# Patient Record
Sex: Female | Born: 1955 | Race: Black or African American | Hispanic: No | Marital: Single
Health system: Midwestern US, Academic
[De-identification: ages and names within clinical notes are randomized; demographics above are authoritative.]

## PROBLEM LIST (undated history)

## (undated) DIAGNOSIS — N289 Disorder of kidney and ureter, unspecified: Secondary | ICD-10-CM

## (undated) DIAGNOSIS — K219 Gastro-esophageal reflux disease without esophagitis: Secondary | ICD-10-CM

## (undated) DIAGNOSIS — M5136 Other intervertebral disc degeneration, lumbar region: Secondary | ICD-10-CM

## (undated) DIAGNOSIS — I10 Essential (primary) hypertension: Secondary | ICD-10-CM

## (undated) DIAGNOSIS — F32A Depression, unspecified: Secondary | ICD-10-CM

## (undated) DIAGNOSIS — K7460 Unspecified cirrhosis of liver: Secondary | ICD-10-CM

## (undated) DIAGNOSIS — I209 Angina pectoris, unspecified: Secondary | ICD-10-CM

## (undated) DIAGNOSIS — M5126 Other intervertebral disc displacement, lumbar region: Secondary | ICD-10-CM

## (undated) DIAGNOSIS — A4902 Methicillin resistant Staphylococcus aureus infection, unspecified site: Secondary | ICD-10-CM

## (undated) DIAGNOSIS — M797 Fibromyalgia: Secondary | ICD-10-CM

## (undated) DIAGNOSIS — R0602 Shortness of breath: Secondary | ICD-10-CM

## (undated) DIAGNOSIS — K5900 Constipation, unspecified: Secondary | ICD-10-CM

## (undated) DIAGNOSIS — H53122 Transient visual loss, left eye: Secondary | ICD-10-CM

## (undated) DIAGNOSIS — T8131XA Disruption of external operation (surgical) wound, not elsewhere classified, initial encounter: Secondary | ICD-10-CM

## (undated) DIAGNOSIS — J45909 Unspecified asthma, uncomplicated: Secondary | ICD-10-CM

## (undated) DIAGNOSIS — G629 Polyneuropathy, unspecified: Secondary | ICD-10-CM

## (undated) DIAGNOSIS — F4310 Post-traumatic stress disorder, unspecified: Secondary | ICD-10-CM

## (undated) DIAGNOSIS — N189 Chronic kidney disease, unspecified: Secondary | ICD-10-CM

## (undated) DIAGNOSIS — E7800 Pure hypercholesterolemia, unspecified: Secondary | ICD-10-CM

## (undated) DIAGNOSIS — I219 Acute myocardial infarction, unspecified: Secondary | ICD-10-CM

## (undated) DIAGNOSIS — M51369 Other intervertebral disc degeneration, lumbar region without mention of lumbar back pain or lower extremity pain: Secondary | ICD-10-CM

## (undated) DIAGNOSIS — K760 Fatty (change of) liver, not elsewhere classified: Secondary | ICD-10-CM

## (undated) DIAGNOSIS — S42209A Unspecified fracture of upper end of unspecified humerus, initial encounter for closed fracture: Secondary | ICD-10-CM

## (undated) DIAGNOSIS — M1990 Unspecified osteoarthritis, unspecified site: Secondary | ICD-10-CM

## (undated) DIAGNOSIS — F329 Major depressive disorder, single episode, unspecified: Secondary | ICD-10-CM

## (undated) DIAGNOSIS — F419 Anxiety disorder, unspecified: Secondary | ICD-10-CM

## (undated) DIAGNOSIS — H269 Unspecified cataract: Secondary | ICD-10-CM

## (undated) DIAGNOSIS — D649 Anemia, unspecified: Secondary | ICD-10-CM

## (undated) HISTORY — DX: Transient visual loss, left eye: H53.122

## (undated) HISTORY — DX: Methicillin resistant Staphylococcus aureus infection, unspecified site: A49.02

## (undated) HISTORY — DX: Unspecified cataract: H26.9

## (undated) HISTORY — DX: Fatty (change of) liver, not elsewhere classified: K76.0

## (undated) HISTORY — DX: Disorder of kidney and ureter, unspecified: N28.9

## (undated) HISTORY — DX: Unspecified cirrhosis of liver: K74.60

---
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
## 1975-10-31 HISTORY — PX: DILATION AND CURETTAGE OF UTERUS: SHX78

## 1977-10-30 HISTORY — PX: ABDOMINAL HYSTERECTOMY: SHX81

## 1982-10-30 HISTORY — PX: INCISION AND DRAINAGE OF WOUND: SHX1803

## 1992-10-30 HISTORY — PX: LEFT OOPHORECTOMY: SHX1961

## 1993-10-30 HISTORY — PX: CHOLECYSTECTOMY: SHX55

## 2000-04-28 ENCOUNTER — Ambulatory Visit (HOSPITAL_COMMUNITY): Admission: AD | Admit: 2000-04-28 | Discharge: 2000-04-28 | Payer: Self-pay | Admitting: Obstetrics and Gynecology

## 2000-06-17 ENCOUNTER — Encounter: Payer: Self-pay | Admitting: Emergency Medicine

## 2000-06-17 ENCOUNTER — Emergency Department (HOSPITAL_COMMUNITY): Admission: EM | Admit: 2000-06-17 | Discharge: 2000-06-17 | Payer: Self-pay | Admitting: Emergency Medicine

## 2001-10-25 ENCOUNTER — Emergency Department (HOSPITAL_COMMUNITY): Admission: EM | Admit: 2001-10-25 | Discharge: 2001-10-25 | Payer: Self-pay | Admitting: Emergency Medicine

## 2001-10-26 ENCOUNTER — Emergency Department (HOSPITAL_COMMUNITY): Admission: EM | Admit: 2001-10-26 | Discharge: 2001-10-26 | Payer: Self-pay | Admitting: Emergency Medicine

## 2002-03-15 ENCOUNTER — Emergency Department (HOSPITAL_COMMUNITY): Admission: EM | Admit: 2002-03-15 | Discharge: 2002-03-15 | Payer: Self-pay

## 2002-09-19 ENCOUNTER — Encounter: Payer: Self-pay | Admitting: Family Medicine

## 2002-09-19 ENCOUNTER — Ambulatory Visit (HOSPITAL_COMMUNITY): Admission: RE | Admit: 2002-09-19 | Discharge: 2002-09-19 | Payer: Self-pay | Admitting: Family Medicine

## 2005-05-30 ENCOUNTER — Ambulatory Visit (HOSPITAL_COMMUNITY): Admission: RE | Admit: 2005-05-30 | Discharge: 2005-05-30 | Payer: Self-pay | Admitting: Family Medicine

## 2005-06-23 ENCOUNTER — Encounter (HOSPITAL_BASED_OUTPATIENT_CLINIC_OR_DEPARTMENT_OTHER): Admission: RE | Admit: 2005-06-23 | Discharge: 2005-07-26 | Payer: Self-pay | Admitting: Surgery

## 2005-10-30 DIAGNOSIS — I219 Acute myocardial infarction, unspecified: Secondary | ICD-10-CM

## 2005-10-30 HISTORY — DX: Acute myocardial infarction, unspecified: I21.9

## 2005-10-30 HISTORY — PX: CARDIAC CATHETERIZATION: SHX172

## 2006-03-30 ENCOUNTER — Inpatient Hospital Stay (HOSPITAL_COMMUNITY): Admission: AD | Admit: 2006-03-30 | Discharge: 2006-04-03 | Payer: Self-pay | Admitting: Internal Medicine

## 2006-03-30 ENCOUNTER — Ambulatory Visit: Payer: Self-pay | Admitting: *Deleted

## 2006-03-30 ENCOUNTER — Ambulatory Visit: Payer: Self-pay | Admitting: Internal Medicine

## 2006-03-30 DIAGNOSIS — Z9889 Other specified postprocedural states: Secondary | ICD-10-CM | POA: Insufficient documentation

## 2006-03-31 ENCOUNTER — Ambulatory Visit: Payer: Self-pay | Admitting: Internal Medicine

## 2006-04-02 ENCOUNTER — Encounter (INDEPENDENT_AMBULATORY_CARE_PROVIDER_SITE_OTHER): Payer: Self-pay | Admitting: *Deleted

## 2006-04-03 ENCOUNTER — Ambulatory Visit: Payer: Self-pay | Admitting: Internal Medicine

## 2006-04-09 ENCOUNTER — Ambulatory Visit: Payer: Self-pay | Admitting: Internal Medicine

## 2006-09-18 ENCOUNTER — Ambulatory Visit: Payer: Self-pay | Admitting: Internal Medicine

## 2006-09-28 ENCOUNTER — Ambulatory Visit: Payer: Self-pay | Admitting: Internal Medicine

## 2006-11-30 LAB — CONVERTED CEMR LAB: Pap Smear: NORMAL

## 2006-12-23 DIAGNOSIS — I10 Essential (primary) hypertension: Secondary | ICD-10-CM

## 2006-12-23 DIAGNOSIS — R079 Chest pain, unspecified: Secondary | ICD-10-CM | POA: Insufficient documentation

## 2006-12-23 DIAGNOSIS — J309 Allergic rhinitis, unspecified: Secondary | ICD-10-CM | POA: Insufficient documentation

## 2006-12-23 HISTORY — DX: Essential (primary) hypertension: I10

## 2007-03-29 ENCOUNTER — Telehealth (INDEPENDENT_AMBULATORY_CARE_PROVIDER_SITE_OTHER): Payer: Self-pay | Admitting: *Deleted

## 2007-04-05 ENCOUNTER — Telehealth (INDEPENDENT_AMBULATORY_CARE_PROVIDER_SITE_OTHER): Payer: Self-pay | Admitting: *Deleted

## 2007-04-25 ENCOUNTER — Encounter (INDEPENDENT_AMBULATORY_CARE_PROVIDER_SITE_OTHER): Payer: Self-pay | Admitting: *Deleted

## 2007-05-24 ENCOUNTER — Telehealth (INDEPENDENT_AMBULATORY_CARE_PROVIDER_SITE_OTHER): Payer: Self-pay | Admitting: *Deleted

## 2007-06-03 ENCOUNTER — Telehealth (INDEPENDENT_AMBULATORY_CARE_PROVIDER_SITE_OTHER): Payer: Self-pay | Admitting: *Deleted

## 2007-06-04 ENCOUNTER — Encounter: Payer: Self-pay | Admitting: Internal Medicine

## 2007-06-14 ENCOUNTER — Telehealth (INDEPENDENT_AMBULATORY_CARE_PROVIDER_SITE_OTHER): Payer: Self-pay | Admitting: *Deleted

## 2007-07-03 ENCOUNTER — Ambulatory Visit: Payer: Self-pay | Admitting: Internal Medicine

## 2007-07-05 ENCOUNTER — Telehealth (INDEPENDENT_AMBULATORY_CARE_PROVIDER_SITE_OTHER): Payer: Self-pay | Admitting: *Deleted

## 2007-07-05 LAB — CONVERTED CEMR LAB
ALT: 41 units/L — ABNORMAL HIGH (ref 0–35)
AST: 37 units/L (ref 0–37)
Albumin: 3.8 g/dL (ref 3.5–5.2)
Alkaline Phosphatase: 90 units/L (ref 39–117)
BUN: 4 mg/dL — ABNORMAL LOW (ref 6–23)
Basophils Absolute: 0 10*3/uL (ref 0.0–0.1)
Basophils Relative: 0 % (ref 0.0–1.0)
Bilirubin, Direct: 0.1 mg/dL (ref 0.0–0.3)
CO2: 32 meq/L (ref 19–32)
Calcium: 9.6 mg/dL (ref 8.4–10.5)
Chloride: 103 meq/L (ref 96–112)
Cholesterol: 145 mg/dL (ref 0–200)
Creatinine, Ser: 0.7 mg/dL (ref 0.4–1.2)
Creatinine,U: 91.8 mg/dL
Eosinophils Absolute: 0.1 10*3/uL (ref 0.0–0.6)
Eosinophils Relative: 1.5 % (ref 0.0–5.0)
GFR calc Af Amer: 113 mL/min
GFR calc non Af Amer: 94 mL/min
Glucose, Bld: 345 mg/dL — ABNORMAL HIGH (ref 70–99)
HCT: 41.8 % (ref 36.0–46.0)
HDL: 34.2 mg/dL — ABNORMAL LOW (ref >39.0)
Hemoglobin: 14.3 g/dL (ref 12.0–15.0)
Hgb A1c MFr Bld: 13.4 % — ABNORMAL HIGH (ref 4.6–6.0)
LDL Cholesterol: 89 mg/dL (ref 0–99)
Lymphocytes Relative: 25.8 % (ref 12.0–46.0)
MCHC: 34.2 g/dL (ref 30.0–36.0)
MCV: 84.6 fL (ref 78.0–100.0)
Microalb Creat Ratio: 8.7 mg/g (ref 0.0–30.0)
Microalb, Ur: 0.8 mg/dL (ref 0.0–1.9)
Monocytes Absolute: 0.3 10*3/uL (ref 0.2–0.7)
Monocytes Relative: 6.6 % (ref 3.0–11.0)
Neutro Abs: 3.5 10*3/uL (ref 1.4–7.7)
Neutrophils Relative %: 66.1 % (ref 43.0–77.0)
Platelets: 160 10*3/uL (ref 150–400)
Potassium: 3.7 meq/L (ref 3.5–5.1)
RBC: 4.94 M/uL (ref 3.87–5.11)
RDW: 12.6 % (ref 11.5–14.6)
Sodium: 140 meq/L (ref 135–145)
TSH: 1.64 microintl units/mL (ref 0.35–5.50)
Total Bilirubin: 0.7 mg/dL (ref 0.3–1.2)
Total CHOL/HDL Ratio: 4.2
Total Protein: 7.5 g/dL (ref 6.0–8.3)
Triglycerides: 109 mg/dL (ref 0–149)
VLDL: 22 mg/dL (ref 0–40)
WBC: 5.3 10*3/uL (ref 4.5–10.5)

## 2007-07-10 ENCOUNTER — Telehealth (INDEPENDENT_AMBULATORY_CARE_PROVIDER_SITE_OTHER): Payer: Self-pay | Admitting: *Deleted

## 2007-07-26 ENCOUNTER — Telehealth (INDEPENDENT_AMBULATORY_CARE_PROVIDER_SITE_OTHER): Payer: Self-pay | Admitting: *Deleted

## 2007-08-02 ENCOUNTER — Telehealth: Payer: Self-pay | Admitting: Internal Medicine

## 2007-08-22 ENCOUNTER — Telehealth (INDEPENDENT_AMBULATORY_CARE_PROVIDER_SITE_OTHER): Payer: Self-pay | Admitting: *Deleted

## 2007-08-28 ENCOUNTER — Telehealth: Payer: Self-pay | Admitting: Internal Medicine

## 2007-08-28 ENCOUNTER — Emergency Department (HOSPITAL_COMMUNITY): Admission: EM | Admit: 2007-08-28 | Discharge: 2007-08-28 | Payer: Self-pay | Admitting: Emergency Medicine

## 2007-09-06 ENCOUNTER — Ambulatory Visit: Payer: Self-pay | Admitting: Internal Medicine

## 2007-09-06 DIAGNOSIS — F411 Generalized anxiety disorder: Secondary | ICD-10-CM | POA: Insufficient documentation

## 2007-09-06 DIAGNOSIS — M542 Cervicalgia: Secondary | ICD-10-CM

## 2007-09-23 ENCOUNTER — Telehealth (INDEPENDENT_AMBULATORY_CARE_PROVIDER_SITE_OTHER): Payer: Self-pay | Admitting: *Deleted

## 2007-10-11 ENCOUNTER — Telehealth: Payer: Self-pay | Admitting: Internal Medicine

## 2008-01-09 ENCOUNTER — Encounter (INDEPENDENT_AMBULATORY_CARE_PROVIDER_SITE_OTHER): Payer: Self-pay | Admitting: *Deleted

## 2008-03-26 ENCOUNTER — Telehealth: Payer: Self-pay | Admitting: Internal Medicine

## 2008-05-13 ENCOUNTER — Encounter (INDEPENDENT_AMBULATORY_CARE_PROVIDER_SITE_OTHER): Payer: Self-pay | Admitting: *Deleted

## 2008-06-01 ENCOUNTER — Telehealth (INDEPENDENT_AMBULATORY_CARE_PROVIDER_SITE_OTHER): Payer: Self-pay | Admitting: *Deleted

## 2008-06-03 ENCOUNTER — Ambulatory Visit: Payer: Self-pay | Admitting: Internal Medicine

## 2008-06-03 DIAGNOSIS — E785 Hyperlipidemia, unspecified: Secondary | ICD-10-CM | POA: Insufficient documentation

## 2008-06-03 HISTORY — DX: Hyperlipidemia, unspecified: E78.5

## 2008-06-03 LAB — CONVERTED CEMR LAB: Blood Glucose, Fingerstick: 329

## 2008-06-08 ENCOUNTER — Telehealth: Payer: Self-pay | Admitting: Internal Medicine

## 2008-06-09 ENCOUNTER — Ambulatory Visit: Payer: Self-pay | Admitting: Internal Medicine

## 2008-06-09 DIAGNOSIS — R109 Unspecified abdominal pain: Secondary | ICD-10-CM | POA: Insufficient documentation

## 2008-06-09 LAB — CONVERTED CEMR LAB
Bilirubin Urine: NEGATIVE
Blood in Urine, dipstick: NEGATIVE
Glucose, Bld: 156 mg/dL
Glucose, Urine, Semiquant: NEGATIVE
Hemoglobin: 14.2 g/dL
Ketones, urine, test strip: NEGATIVE
Nitrite: NEGATIVE
Protein, U semiquant: NEGATIVE
Specific Gravity, Urine: 1.01
Urobilinogen, UA: 0.2
WBC Urine, dipstick: NEGATIVE
pH: 5

## 2008-06-10 ENCOUNTER — Ambulatory Visit: Payer: Self-pay | Admitting: Internal Medicine

## 2008-06-11 ENCOUNTER — Telehealth: Payer: Self-pay | Admitting: Internal Medicine

## 2008-06-12 LAB — CONVERTED CEMR LAB
ALT: 22 units/L (ref 0–35)
AST: 23 units/L (ref 0–37)
Albumin: 3.6 g/dL (ref 3.5–5.2)
Alkaline Phosphatase: 60 units/L (ref 39–117)
Amylase: 83 units/L (ref 27–131)
BUN: 8 mg/dL (ref 6–23)
Basophils Absolute: 0 10*3/uL (ref 0.0–0.1)
Basophils Relative: 0.3 % (ref 0.0–3.0)
Bilirubin, Direct: 0.1 mg/dL (ref 0.0–0.3)
CO2: 28 meq/L (ref 19–32)
Calcium: 9.3 mg/dL (ref 8.4–10.5)
Chloride: 104 meq/L (ref 96–112)
Creatinine, Ser: 0.7 mg/dL (ref 0.4–1.2)
Eosinophils Absolute: 0.1 10*3/uL (ref 0.0–0.7)
Eosinophils Relative: 1.4 % (ref 0.0–5.0)
GFR calc Af Amer: 113 mL/min
GFR calc non Af Amer: 93 mL/min
Glucose, Bld: 256 mg/dL — ABNORMAL HIGH (ref 70–99)
HCT: 41.5 % (ref 36.0–46.0)
Hemoglobin: 14.2 g/dL (ref 12.0–15.0)
Hgb A1c MFr Bld: 13.2 % — ABNORMAL HIGH (ref 4.6–6.0)
Lipase: 33 units/L (ref 11.0–59.0)
Lymphocytes Relative: 30.8 % (ref 12.0–46.0)
MCHC: 34.2 g/dL (ref 30.0–36.0)
MCV: 86.6 fL (ref 78.0–100.0)
Monocytes Absolute: 0.3 10*3/uL (ref 0.1–1.0)
Monocytes Relative: 7.2 % (ref 3.0–12.0)
Neutro Abs: 2.7 10*3/uL (ref 1.4–7.7)
Neutrophils Relative %: 60.3 % (ref 43.0–77.0)
Platelets: 144 10*3/uL — ABNORMAL LOW (ref 150–400)
Potassium: 4.4 meq/L (ref 3.5–5.1)
RBC: 4.79 M/uL (ref 3.87–5.11)
RDW: 12.4 % (ref 11.5–14.6)
Sodium: 138 meq/L (ref 135–145)
Total Bilirubin: 0.6 mg/dL (ref 0.3–1.2)
Total Protein: 6.9 g/dL (ref 6.0–8.3)
WBC: 4.5 10*3/uL (ref 4.5–10.5)

## 2008-06-26 ENCOUNTER — Telehealth (INDEPENDENT_AMBULATORY_CARE_PROVIDER_SITE_OTHER): Payer: Self-pay | Admitting: *Deleted

## 2008-07-03 ENCOUNTER — Telehealth (INDEPENDENT_AMBULATORY_CARE_PROVIDER_SITE_OTHER): Payer: Self-pay | Admitting: *Deleted

## 2008-07-27 ENCOUNTER — Ambulatory Visit: Payer: Self-pay | Admitting: Internal Medicine

## 2008-08-31 ENCOUNTER — Telehealth: Payer: Self-pay | Admitting: Internal Medicine

## 2008-09-01 ENCOUNTER — Ambulatory Visit: Payer: Self-pay | Admitting: Internal Medicine

## 2008-09-01 DIAGNOSIS — M79609 Pain in unspecified limb: Secondary | ICD-10-CM | POA: Insufficient documentation

## 2008-09-14 ENCOUNTER — Telehealth (INDEPENDENT_AMBULATORY_CARE_PROVIDER_SITE_OTHER): Payer: Self-pay | Admitting: *Deleted

## 2008-09-17 ENCOUNTER — Telehealth (INDEPENDENT_AMBULATORY_CARE_PROVIDER_SITE_OTHER): Payer: Self-pay | Admitting: *Deleted

## 2008-10-09 ENCOUNTER — Telehealth (INDEPENDENT_AMBULATORY_CARE_PROVIDER_SITE_OTHER): Payer: Self-pay | Admitting: *Deleted

## 2008-10-13 ENCOUNTER — Ambulatory Visit: Payer: Self-pay | Admitting: Family Medicine

## 2008-10-13 DIAGNOSIS — IMO0001 Reserved for inherently not codable concepts without codable children: Secondary | ICD-10-CM

## 2008-10-14 ENCOUNTER — Encounter (INDEPENDENT_AMBULATORY_CARE_PROVIDER_SITE_OTHER): Payer: Self-pay | Admitting: *Deleted

## 2008-10-14 LAB — CONVERTED CEMR LAB: Anti Nuclear Antibody(ANA): NEGATIVE

## 2008-10-20 LAB — CONVERTED CEMR LAB
ALT: 21 units/L (ref 0–35)
AST: 23 units/L (ref 0–37)
Albumin: 3.9 g/dL (ref 3.5–5.2)
Alkaline Phosphatase: 70 units/L (ref 39–117)
BUN: 7 mg/dL (ref 6–23)
Basophils Absolute: 0 10*3/uL (ref 0.0–0.1)
Basophils Relative: 0.5 % (ref 0.0–3.0)
Bilirubin, Direct: 0.1 mg/dL (ref 0.0–0.3)
CO2: 30 meq/L (ref 19–32)
Calcium: 9.7 mg/dL (ref 8.4–10.5)
Chloride: 103 meq/L (ref 96–112)
Creatinine, Ser: 0.7 mg/dL (ref 0.4–1.2)
Eosinophils Absolute: 0.1 10*3/uL (ref 0.0–0.7)
Eosinophils Relative: 2.3 % (ref 0.0–5.0)
Folate: 8.7 ng/mL
GFR calc Af Amer: 113 mL/min
GFR calc non Af Amer: 93 mL/min
Glucose, Bld: 306 mg/dL — ABNORMAL HIGH (ref 70–99)
HCT: 40.6 % (ref 36.0–46.0)
Hemoglobin: 13.9 g/dL (ref 12.0–15.0)
Lymphocytes Relative: 31.9 % (ref 12.0–46.0)
MCHC: 34.3 g/dL (ref 30.0–36.0)
MCV: 85.6 fL (ref 78.0–100.0)
Monocytes Absolute: 0.2 10*3/uL (ref 0.1–1.0)
Monocytes Relative: 4.9 % (ref 3.0–12.0)
Neutro Abs: 3.2 10*3/uL (ref 1.4–7.7)
Neutrophils Relative %: 60.4 % (ref 43.0–77.0)
Platelets: 131 10*3/uL — ABNORMAL LOW (ref 150–400)
Potassium: 4.2 meq/L (ref 3.5–5.1)
RBC: 4.74 M/uL (ref 3.87–5.11)
RDW: 12 % (ref 11.5–14.6)
Rheumatoid fact SerPl-aCnc: <20 intl units/mL — ABNORMAL LOW (ref 0.0–20.0)
Sed Rate: 10 mm/hr (ref 0–22)
Sodium: 139 meq/L (ref 135–145)
TSH: 1.06 microintl units/mL (ref 0.35–5.50)
Total Bilirubin: 0.7 mg/dL (ref 0.3–1.2)
Total CK: 87 units/L (ref 7–177)
Total Protein: 6.8 g/dL (ref 6.0–8.3)
Uric Acid, Serum: 3 mg/dL (ref 2.4–7.0)
Vit D, 1,25-Dihydroxy: 28 — ABNORMAL LOW (ref 30–89)
Vitamin B-12: 239 pg/mL (ref 211–911)
WBC: 5.1 10*3/uL (ref 4.5–10.5)

## 2008-10-22 ENCOUNTER — Telehealth (INDEPENDENT_AMBULATORY_CARE_PROVIDER_SITE_OTHER): Payer: Self-pay | Admitting: *Deleted

## 2008-10-27 ENCOUNTER — Ambulatory Visit: Payer: Self-pay | Admitting: Family Medicine

## 2008-11-11 DIAGNOSIS — D696 Thrombocytopenia, unspecified: Secondary | ICD-10-CM

## 2008-11-11 HISTORY — DX: Thrombocytopenia, unspecified: D69.6

## 2008-11-12 LAB — CONVERTED CEMR LAB
Basophils Absolute: 0 10*3/uL (ref 0.0–0.1)
Basophils Relative: 0.6 % (ref 0.0–3.0)
Eosinophils Absolute: 0.1 10*3/uL (ref 0.0–0.7)
Eosinophils Relative: 2.1 % (ref 0.0–5.0)
HCT: 40.8 % (ref 36.0–46.0)
Hemoglobin: 14 g/dL (ref 12.0–15.0)
Lymphocytes Relative: 29.5 % (ref 12.0–46.0)
MCHC: 34.3 g/dL (ref 30.0–36.0)
MCV: 85.2 fL (ref 78.0–100.0)
Monocytes Absolute: 0.3 10*3/uL (ref 0.1–1.0)
Monocytes Relative: 5.3 % (ref 3.0–12.0)
Neutro Abs: 3.1 10*3/uL (ref 1.4–7.7)
Neutrophils Relative %: 62.5 % (ref 43.0–77.0)
Platelets: 127 10*3/uL — ABNORMAL LOW (ref 150–400)
RBC: 4.79 M/uL (ref 3.87–5.11)
RDW: 12.2 % (ref 11.5–14.6)
WBC: 4.9 10*3/uL (ref 4.5–10.5)

## 2008-11-16 ENCOUNTER — Telehealth (INDEPENDENT_AMBULATORY_CARE_PROVIDER_SITE_OTHER): Payer: Self-pay | Admitting: *Deleted

## 2008-11-18 ENCOUNTER — Ambulatory Visit: Payer: Self-pay | Admitting: Internal Medicine

## 2008-11-20 ENCOUNTER — Telehealth: Payer: Self-pay | Admitting: Family Medicine

## 2008-11-30 LAB — CBC WITH DIFFERENTIAL/PLATELET
BASO%: 0.3 % (ref 0.0–2.0)
LYMPH%: 23.3 % (ref 14.0–48.0)
MCHC: 34.2 g/dL (ref 32.0–36.0)
MONO#: 0.3 10*3/uL (ref 0.1–0.9)
Platelets: 133 10*3/uL — ABNORMAL LOW (ref 145–400)
RBC: 5.22 10*6/uL (ref 3.70–5.32)
WBC: 5.1 10*3/uL (ref 3.9–10.0)

## 2008-11-30 LAB — COMPREHENSIVE METABOLIC PANEL
ALT: 19 U/L (ref 0–35)
Alkaline Phosphatase: 85 U/L (ref 39–117)
CO2: 24 mEq/L (ref 19–32)
Sodium: 133 mEq/L — ABNORMAL LOW (ref 135–145)
Total Bilirubin: 0.7 mg/dL (ref 0.3–1.2)
Total Protein: 7.5 g/dL (ref 6.0–8.3)

## 2008-11-30 LAB — LACTATE DEHYDROGENASE: LDH: 143 U/L (ref 94–250)

## 2008-12-07 ENCOUNTER — Encounter (INDEPENDENT_AMBULATORY_CARE_PROVIDER_SITE_OTHER): Payer: Self-pay | Admitting: *Deleted

## 2008-12-07 ENCOUNTER — Ambulatory Visit (HOSPITAL_COMMUNITY): Admission: RE | Admit: 2008-12-07 | Discharge: 2008-12-07 | Payer: Self-pay | Admitting: Internal Medicine

## 2008-12-07 ENCOUNTER — Telehealth (INDEPENDENT_AMBULATORY_CARE_PROVIDER_SITE_OTHER): Payer: Self-pay | Admitting: *Deleted

## 2008-12-09 ENCOUNTER — Encounter: Payer: Self-pay | Admitting: Family Medicine

## 2009-01-04 ENCOUNTER — Telehealth: Payer: Self-pay | Admitting: Family Medicine

## 2009-01-26 ENCOUNTER — Ambulatory Visit: Payer: Self-pay | Admitting: Family Medicine

## 2009-01-28 ENCOUNTER — Ambulatory Visit: Payer: Self-pay | Admitting: Family Medicine

## 2009-02-12 LAB — CONVERTED CEMR LAB
ALT: 21 units/L (ref 0–35)
AST: 21 units/L (ref 0–37)
Albumin: 3.9 g/dL (ref 3.5–5.2)
Alkaline Phosphatase: 78 units/L (ref 39–117)
BUN: 10 mg/dL (ref 6–23)
Bilirubin, Direct: 0.1 mg/dL (ref 0.0–0.3)
CO2: 26 meq/L (ref 19–32)
Calcium: 9.1 mg/dL (ref 8.4–10.5)
Chloride: 103 meq/L (ref 96–112)
Cholesterol: 184 mg/dL (ref 0–200)
Creatinine, Ser: 0.7 mg/dL (ref 0.4–1.2)
Creatinine,U: 56.2 mg/dL
GFR calc non Af Amer: 93.02 mL/min (ref >60)
Glucose, Bld: 396 mg/dL — ABNORMAL HIGH (ref 70–99)
HDL: 35.8 mg/dL — ABNORMAL LOW (ref >39.00)
Hgb A1c MFr Bld: 13.2 % — ABNORMAL HIGH (ref 4.6–6.5)
LDL Cholesterol: 124 mg/dL — ABNORMAL HIGH (ref 0–99)
Microalb Creat Ratio: 17.8 mg/g (ref 0.0–30.0)
Microalb, Ur: 1 mg/dL (ref 0.0–1.9)
Potassium: 3.9 meq/L (ref 3.5–5.1)
Sodium: 137 meq/L (ref 135–145)
Total Bilirubin: 0.9 mg/dL (ref 0.3–1.2)
Total CHOL/HDL Ratio: 5
Total Protein: 7.2 g/dL (ref 6.0–8.3)
Triglycerides: 123 mg/dL (ref 0.0–149.0)
VLDL: 24.6 mg/dL (ref 0.0–40.0)

## 2009-02-15 ENCOUNTER — Telehealth (INDEPENDENT_AMBULATORY_CARE_PROVIDER_SITE_OTHER): Payer: Self-pay | Admitting: *Deleted

## 2009-02-19 ENCOUNTER — Ambulatory Visit: Payer: Self-pay | Admitting: Endocrinology

## 2009-03-05 ENCOUNTER — Ambulatory Visit: Payer: Self-pay | Admitting: Endocrinology

## 2009-03-22 ENCOUNTER — Ambulatory Visit: Payer: Self-pay | Admitting: Endocrinology

## 2009-06-02 ENCOUNTER — Ambulatory Visit: Payer: Self-pay | Admitting: Family Medicine

## 2009-06-28 ENCOUNTER — Telehealth (INDEPENDENT_AMBULATORY_CARE_PROVIDER_SITE_OTHER): Payer: Self-pay | Admitting: *Deleted

## 2009-08-16 ENCOUNTER — Telehealth (INDEPENDENT_AMBULATORY_CARE_PROVIDER_SITE_OTHER): Payer: Self-pay | Admitting: *Deleted

## 2009-09-10 ENCOUNTER — Ambulatory Visit: Payer: Self-pay | Admitting: Family Medicine

## 2009-09-10 LAB — CONVERTED CEMR LAB
Anti Nuclear Antibody(ANA): NEGATIVE
Vit D, 25-Hydroxy: 33 ng/mL (ref 30–89)

## 2009-09-15 ENCOUNTER — Encounter: Payer: Self-pay | Admitting: Family Medicine

## 2009-09-15 LAB — CONVERTED CEMR LAB
ALT: 23 units/L (ref 0–35)
AST: 22 units/L (ref 0–37)
Albumin: 4.1 g/dL (ref 3.5–5.2)
Alkaline Phosphatase: 83 units/L (ref 39–117)
BUN: 6 mg/dL (ref 6–23)
Basophils Absolute: 0 10*3/uL (ref 0.0–0.1)
Basophils Relative: 0 % (ref 0.0–3.0)
Bilirubin, Direct: 0.2 mg/dL (ref 0.0–0.3)
CO2: 27 meq/L (ref 19–32)
Calcium: 9.3 mg/dL (ref 8.4–10.5)
Chloride: 101 meq/L (ref 96–112)
Cholesterol: 198 mg/dL (ref 0–200)
Creatinine, Ser: 0.8 mg/dL (ref 0.4–1.2)
Creatinine,U: 58.8 mg/dL
Eosinophils Absolute: 0.1 10*3/uL (ref 0.0–0.7)
Eosinophils Relative: 1.4 % (ref 0.0–5.0)
GFR calc non Af Amer: 79.55 mL/min (ref >60)
Glucose, Bld: 382 mg/dL — ABNORMAL HIGH (ref 70–99)
HCT: 44 % (ref 36.0–46.0)
HDL: 34.9 mg/dL — ABNORMAL LOW (ref >39.00)
Hemoglobin: 14.9 g/dL (ref 12.0–15.0)
Hgb A1c MFr Bld: 14 % — ABNORMAL HIGH (ref 4.6–6.5)
LDL Cholesterol: 134 mg/dL — ABNORMAL HIGH (ref 0–99)
Lymphocytes Relative: 26.7 % (ref 12.0–46.0)
Lymphs Abs: 1.5 10*3/uL (ref 0.7–4.0)
MCHC: 33.8 g/dL (ref 30.0–36.0)
MCV: 86.3 fL (ref 78.0–100.0)
Microalb Creat Ratio: 8.5 mg/g (ref 0.0–30.0)
Microalb, Ur: 0.5 mg/dL (ref 0.0–1.9)
Monocytes Absolute: 0.4 10*3/uL (ref 0.1–1.0)
Monocytes Relative: 7.2 % (ref 3.0–12.0)
Neutro Abs: 3.6 10*3/uL (ref 1.4–7.7)
Neutrophils Relative %: 64.7 % (ref 43.0–77.0)
Platelets: 132 10*3/uL — ABNORMAL LOW (ref 150.0–400.0)
Potassium: 4.4 meq/L (ref 3.5–5.1)
RBC: 5.1 M/uL (ref 3.87–5.11)
RDW: 12.4 % (ref 11.5–14.6)
Rheumatoid fact SerPl-aCnc: <20 intl units/mL (ref 0.0–20.0)
Sed Rate: 10 mm/hr (ref 0–22)
Sodium: 137 meq/L (ref 135–145)
TSH: 1.09 microintl units/mL (ref 0.35–5.50)
Total Bilirubin: 1.4 mg/dL — ABNORMAL HIGH (ref 0.3–1.2)
Total CHOL/HDL Ratio: 6
Total Protein: 7.5 g/dL (ref 6.0–8.3)
Triglycerides: 144 mg/dL (ref 0.0–149.0)
VLDL: 28.8 mg/dL (ref 0.0–40.0)
WBC: 5.6 10*3/uL (ref 4.5–10.5)

## 2009-10-13 ENCOUNTER — Telehealth: Payer: Self-pay | Admitting: Family Medicine

## 2009-11-01 ENCOUNTER — Telehealth: Payer: Self-pay | Admitting: Family Medicine

## 2009-11-15 ENCOUNTER — Telehealth: Payer: Self-pay | Admitting: Family Medicine

## 2009-12-20 ENCOUNTER — Telehealth: Payer: Self-pay | Admitting: Family Medicine

## 2010-02-01 ENCOUNTER — Telehealth: Payer: Self-pay | Admitting: Family Medicine

## 2010-02-03 ENCOUNTER — Ambulatory Visit: Payer: Self-pay | Admitting: Family Medicine

## 2010-02-03 DIAGNOSIS — R209 Unspecified disturbances of skin sensation: Secondary | ICD-10-CM

## 2010-02-03 DIAGNOSIS — R55 Syncope and collapse: Secondary | ICD-10-CM

## 2010-02-04 ENCOUNTER — Encounter: Payer: Self-pay | Admitting: Family Medicine

## 2010-02-16 ENCOUNTER — Encounter: Payer: Self-pay | Admitting: Family Medicine

## 2010-02-17 ENCOUNTER — Ambulatory Visit: Payer: Self-pay

## 2010-02-17 ENCOUNTER — Encounter: Payer: Self-pay | Admitting: Family Medicine

## 2010-02-24 ENCOUNTER — Encounter (INDEPENDENT_AMBULATORY_CARE_PROVIDER_SITE_OTHER): Payer: Self-pay | Admitting: *Deleted

## 2010-03-14 ENCOUNTER — Ambulatory Visit: Payer: Self-pay | Admitting: Family Medicine

## 2010-03-14 DIAGNOSIS — IMO0002 Reserved for concepts with insufficient information to code with codable children: Secondary | ICD-10-CM

## 2010-03-14 DIAGNOSIS — R1013 Epigastric pain: Secondary | ICD-10-CM

## 2010-03-14 DIAGNOSIS — R319 Hematuria, unspecified: Secondary | ICD-10-CM

## 2010-03-14 DIAGNOSIS — K3189 Other diseases of stomach and duodenum: Secondary | ICD-10-CM

## 2010-03-14 LAB — CONVERTED CEMR LAB
Bilirubin Urine: NEGATIVE
Blood Glucose, Fingerstick: 322
Blood in Urine, dipstick: NEGATIVE
Glucose, Urine, Semiquant: 500
Ketones, urine, test strip: NEGATIVE
Nitrite: NEGATIVE
Protein, U semiquant: NEGATIVE
Specific Gravity, Urine: 1.015
Urobilinogen, UA: 0.2
WBC Urine, dipstick: NEGATIVE
pH: 6

## 2010-03-15 ENCOUNTER — Encounter: Payer: Self-pay | Admitting: Family Medicine

## 2010-03-15 ENCOUNTER — Encounter (INDEPENDENT_AMBULATORY_CARE_PROVIDER_SITE_OTHER): Payer: Self-pay | Admitting: *Deleted

## 2010-03-17 ENCOUNTER — Encounter: Payer: Self-pay | Admitting: Family Medicine

## 2010-04-11 ENCOUNTER — Telehealth: Payer: Self-pay | Admitting: Family Medicine

## 2010-05-03 ENCOUNTER — Ambulatory Visit: Payer: Self-pay | Admitting: Endocrinology

## 2010-05-18 ENCOUNTER — Telehealth: Payer: Self-pay | Admitting: Family Medicine

## 2010-05-19 ENCOUNTER — Ambulatory Visit: Payer: Self-pay | Admitting: Family Medicine

## 2010-05-20 ENCOUNTER — Telehealth (INDEPENDENT_AMBULATORY_CARE_PROVIDER_SITE_OTHER): Payer: Self-pay | Admitting: *Deleted

## 2010-05-20 LAB — CONVERTED CEMR LAB
Anti Nuclear Antibody(ANA): NEGATIVE
Rheumatoid fact SerPl-aCnc: <20 intl units/mL (ref 0–20)

## 2010-05-23 LAB — CONVERTED CEMR LAB
BUN: 12 mg/dL (ref 6–23)
CO2: 28 meq/L (ref 19–32)
Calcium: 9.5 mg/dL (ref 8.4–10.5)
Chloride: 104 meq/L (ref 96–112)
Creatinine, Ser: 0.6 mg/dL (ref 0.4–1.2)
GFR calc non Af Amer: 104.53 mL/min (ref >60)
Glucose, Bld: 219 mg/dL — ABNORMAL HIGH (ref 70–99)
Potassium: 4.1 meq/L (ref 3.5–5.1)
Sed Rate: 10 mm/hr (ref 0–22)
Sodium: 139 meq/L (ref 135–145)
TSH: 1.67 microintl units/mL (ref 0.35–5.50)

## 2010-06-09 ENCOUNTER — Telehealth (INDEPENDENT_AMBULATORY_CARE_PROVIDER_SITE_OTHER): Payer: Self-pay | Admitting: *Deleted

## 2010-07-05 ENCOUNTER — Ambulatory Visit: Payer: Self-pay | Admitting: Family Medicine

## 2010-07-05 DIAGNOSIS — R300 Dysuria: Secondary | ICD-10-CM | POA: Insufficient documentation

## 2010-07-05 LAB — CONVERTED CEMR LAB
Blood in Urine, dipstick: NEGATIVE
Glucose, Urine, Semiquant: >=1000
Ketones, urine, test strip: NEGATIVE
Nitrite: NEGATIVE
Specific Gravity, Urine: 1.025
Urobilinogen, UA: 0.2
WBC Urine, dipstick: NEGATIVE
pH: 5

## 2010-07-06 ENCOUNTER — Telehealth: Payer: Self-pay | Admitting: Family Medicine

## 2010-07-06 ENCOUNTER — Encounter: Payer: Self-pay | Admitting: Family Medicine

## 2010-07-06 ENCOUNTER — Ambulatory Visit: Payer: Self-pay | Admitting: Cardiovascular Disease

## 2010-07-06 DIAGNOSIS — K7460 Unspecified cirrhosis of liver: Secondary | ICD-10-CM | POA: Insufficient documentation

## 2010-07-06 LAB — CONVERTED CEMR LAB
ALT: 22 units/L (ref 0–35)
AST: 21 units/L (ref 0–37)
Albumin: 3.8 g/dL (ref 3.5–5.2)
Alkaline Phosphatase: 64 units/L (ref 39–117)
BUN: 9 mg/dL (ref 6–23)
Basophils Absolute: 0 10*3/uL (ref 0.0–0.1)
Basophils Relative: 0.2 % (ref 0.0–3.0)
Bilirubin, Direct: 0.2 mg/dL (ref 0.0–0.3)
CO2: 29 meq/L (ref 19–32)
Calcium: 9.3 mg/dL (ref 8.4–10.5)
Chloride: 100 meq/L (ref 96–112)
Creatinine, Ser: 0.7 mg/dL (ref 0.4–1.2)
Eosinophils Absolute: 0.1 10*3/uL (ref 0.0–0.7)
Eosinophils Relative: 1.6 % (ref 0.0–5.0)
GFR calc non Af Amer: 95.66 mL/min (ref >60)
Glucose, Bld: 214 mg/dL — ABNORMAL HIGH (ref 70–99)
HCT: 40.7 % (ref 36.0–46.0)
Hemoglobin: 13.9 g/dL (ref 12.0–15.0)
Lymphocytes Relative: 47.2 % — ABNORMAL HIGH (ref 12.0–46.0)
Lymphs Abs: 2.4 10*3/uL (ref 0.7–4.0)
MCHC: 34.2 g/dL (ref 30.0–36.0)
MCV: 84 fL (ref 78.0–100.0)
Monocytes Absolute: 0.6 10*3/uL (ref 0.1–1.0)
Monocytes Relative: 11.3 % (ref 3.0–12.0)
Neutro Abs: 2 10*3/uL (ref 1.4–7.7)
Neutrophils Relative %: 39.7 % — ABNORMAL LOW (ref 43.0–77.0)
Platelets: 140 10*3/uL — ABNORMAL LOW (ref 150.0–400.0)
Potassium: 4.2 meq/L (ref 3.5–5.1)
RBC: 4.85 M/uL (ref 3.87–5.11)
RDW: 13.1 % (ref 11.5–14.6)
Sodium: 137 meq/L (ref 135–145)
Total Bilirubin: 0.7 mg/dL (ref 0.3–1.2)
Total Protein: 6.5 g/dL (ref 6.0–8.3)
WBC: 5.1 10*3/uL (ref 4.5–10.5)

## 2010-07-07 ENCOUNTER — Encounter (INDEPENDENT_AMBULATORY_CARE_PROVIDER_SITE_OTHER): Payer: Self-pay | Admitting: *Deleted

## 2010-07-14 ENCOUNTER — Telehealth (INDEPENDENT_AMBULATORY_CARE_PROVIDER_SITE_OTHER): Payer: Self-pay | Admitting: *Deleted

## 2010-07-18 ENCOUNTER — Telehealth (INDEPENDENT_AMBULATORY_CARE_PROVIDER_SITE_OTHER): Payer: Self-pay | Admitting: *Deleted

## 2010-07-19 ENCOUNTER — Ambulatory Visit: Payer: Self-pay | Admitting: Family Medicine

## 2010-08-05 ENCOUNTER — Ambulatory Visit: Payer: Self-pay | Admitting: Family Medicine

## 2010-08-05 DIAGNOSIS — N390 Urinary tract infection, site not specified: Secondary | ICD-10-CM | POA: Insufficient documentation

## 2010-08-05 LAB — CONVERTED CEMR LAB
Bilirubin Urine: NEGATIVE
Glucose, Urine, Semiquant: NEGATIVE
Ketones, urine, test strip: NEGATIVE
Nitrite: POSITIVE
Protein, U semiquant: NEGATIVE
Specific Gravity, Urine: 1.02
Urobilinogen, UA: 0.2
pH: 5

## 2010-08-08 ENCOUNTER — Emergency Department (HOSPITAL_BASED_OUTPATIENT_CLINIC_OR_DEPARTMENT_OTHER): Admission: EM | Admit: 2010-08-08 | Discharge: 2010-08-08 | Payer: Self-pay | Admitting: Emergency Medicine

## 2010-08-09 ENCOUNTER — Telehealth: Payer: Self-pay | Admitting: Family Medicine

## 2010-08-18 ENCOUNTER — Ambulatory Visit: Payer: Self-pay | Admitting: Family Medicine

## 2010-08-18 ENCOUNTER — Encounter: Payer: Self-pay | Admitting: Family Medicine

## 2010-08-19 ENCOUNTER — Ambulatory Visit: Payer: Self-pay | Admitting: Internal Medicine

## 2010-08-19 DIAGNOSIS — K5900 Constipation, unspecified: Secondary | ICD-10-CM | POA: Insufficient documentation

## 2010-08-19 DIAGNOSIS — R932 Abnormal findings on diagnostic imaging of liver and biliary tract: Secondary | ICD-10-CM

## 2010-08-19 LAB — CONVERTED CEMR LAB
ALT: 20 units/L (ref 0–35)
AST: 19 units/L (ref 0–37)
Albumin: 3.6 g/dL (ref 3.5–5.2)
Alkaline Phosphatase: 65 units/L (ref 39–117)
BUN: 9 mg/dL (ref 6–23)
Basophils Absolute: 0 10*3/uL (ref 0.0–0.1)
Basophils Relative: 0.8 % (ref 0.0–3.0)
Bilirubin, Direct: 0.1 mg/dL (ref 0.0–0.3)
CO2: 30 meq/L (ref 19–32)
Calcium: 9.3 mg/dL (ref 8.4–10.5)
Chloride: 105 meq/L (ref 96–112)
Creatinine, Ser: 0.7 mg/dL (ref 0.4–1.2)
Eosinophils Absolute: 0.1 10*3/uL (ref 0.0–0.7)
Eosinophils Relative: 0.9 % (ref 0.0–5.0)
Ferritin: 139.3 ng/mL (ref 10.0–291.0)
GFR calc non Af Amer: 94.02 mL/min (ref >60)
Glucose, Bld: 121 mg/dL — ABNORMAL HIGH (ref 70–99)
HCT: 38.4 % (ref 36.0–46.0)
HCV Ab: NEGATIVE
Hemoglobin: 13 g/dL (ref 12.0–15.0)
Hep B S Ab: NEGATIVE
Hepatitis B Surface Ag: NEGATIVE
INR: 1.1 — ABNORMAL HIGH (ref 0.8–1.0)
Iron: 66 ug/dL (ref 42–145)
Lymphocytes Relative: 29.3 % (ref 12.0–46.0)
Lymphs Abs: 1.6 10*3/uL (ref 0.7–4.0)
MCHC: 33.9 g/dL (ref 30.0–36.0)
MCV: 84.2 fL (ref 78.0–100.0)
Magnesium: 1.9 mg/dL (ref 1.5–2.5)
Monocytes Absolute: 0.4 10*3/uL (ref 0.1–1.0)
Monocytes Relative: 7.6 % (ref 3.0–12.0)
Neutro Abs: 3.4 10*3/uL (ref 1.4–7.7)
Neutrophils Relative %: 61.4 % (ref 43.0–77.0)
Phosphorus: 3.5 mg/dL (ref 2.3–4.6)
Platelets: 155 10*3/uL (ref 150.0–400.0)
Potassium: 3.9 meq/L (ref 3.5–5.1)
Prothrombin Time: 12 s — ABNORMAL HIGH (ref 9.7–11.8)
RBC: 4.57 M/uL (ref 3.87–5.11)
RDW: 13.3 % (ref 11.5–14.6)
Saturation Ratios: 18.5 % — ABNORMAL LOW (ref 20.0–50.0)
Sed Rate: 12 mm/hr (ref 0–22)
Sodium: 140 meq/L (ref 135–145)
TSH: 0.77 microintl units/mL (ref 0.35–5.50)
Total Bilirubin: 0.7 mg/dL (ref 0.3–1.2)
Total Protein: 6.6 g/dL (ref 6.0–8.3)
Transferrin: 255.5 mg/dL (ref 212.0–360.0)
WBC: 5.6 10*3/uL (ref 4.5–10.5)

## 2010-08-22 LAB — CONVERTED CEMR LAB
ANA Titer 1: 1:40 {titer} — ABNORMAL HIGH
Anti Nuclear Antibody(ANA): POSITIVE — AB
Rheumatoid fact SerPl-aCnc: <20 intl units/mL (ref 0–20)
Vit D, 25-Hydroxy: 68 ng/mL (ref 30–89)

## 2010-09-13 ENCOUNTER — Encounter: Payer: Self-pay | Admitting: Family Medicine

## 2010-09-16 ENCOUNTER — Ambulatory Visit: Payer: Self-pay | Admitting: Family Medicine

## 2010-09-19 ENCOUNTER — Ambulatory Visit: Payer: Self-pay | Admitting: Internal Medicine

## 2010-09-19 DIAGNOSIS — R933 Abnormal findings on diagnostic imaging of other parts of digestive tract: Secondary | ICD-10-CM

## 2010-10-28 ENCOUNTER — Telehealth: Payer: Self-pay | Admitting: Internal Medicine

## 2010-11-01 ENCOUNTER — Ambulatory Visit: Admit: 2010-11-01 | Payer: Self-pay | Admitting: Internal Medicine

## 2010-11-02 ENCOUNTER — Emergency Department (HOSPITAL_BASED_OUTPATIENT_CLINIC_OR_DEPARTMENT_OTHER)
Admission: EM | Admit: 2010-11-02 | Discharge: 2010-11-03 | Payer: Self-pay | Source: Home / Self Care | Admitting: Emergency Medicine

## 2010-11-02 LAB — DIFFERENTIAL
Basophils Absolute: 0 10*3/uL (ref 0.0–0.1)
Basophils Relative: 0 % (ref 0–1)
Eosinophils Absolute: 0.1 10*3/uL (ref 0.0–0.7)
Eosinophils Relative: 1 % (ref 0–5)
Lymphocytes Relative: 15 % (ref 12–46)
Lymphs Abs: 1.8 10*3/uL (ref 0.7–4.0)
Monocytes Absolute: 1.3 10*3/uL — ABNORMAL HIGH (ref 0.1–1.0)
Monocytes Relative: 11 % (ref 3–12)
Neutro Abs: 9 10*3/uL — ABNORMAL HIGH (ref 1.7–7.7)
Neutrophils Relative %: 74 % (ref 43–77)

## 2010-11-02 LAB — BASIC METABOLIC PANEL
BUN: 10 mg/dL (ref 6–23)
CO2: 28 mEq/L (ref 19–32)
Calcium: 9.4 mg/dL (ref 8.4–10.5)
Chloride: 95 mEq/L — ABNORMAL LOW (ref 96–112)
Creatinine, Ser: 0.7 mg/dL (ref 0.4–1.2)
GFR calc Af Amer: >60 mL/min (ref >60)
GFR calc non Af Amer: >60 mL/min (ref >60)
Glucose, Bld: 373 mg/dL — ABNORMAL HIGH (ref 70–99)
Potassium: 5.1 mEq/L (ref 3.5–5.1)
Sodium: 135 mEq/L (ref 135–145)

## 2010-11-02 LAB — CBC
HCT: 39 % (ref 36.0–46.0)
Hemoglobin: 13.7 g/dL (ref 12.0–15.0)
MCH: 27.5 pg (ref 26.0–34.0)
MCHC: 35.1 g/dL (ref 30.0–36.0)
MCV: 78.2 fL (ref 78.0–100.0)
Platelets: 211 10*3/uL (ref 150–400)
RBC: 4.99 MIL/uL (ref 3.87–5.11)
RDW: 12.7 % (ref 11.5–15.5)
WBC: 12.2 10*3/uL — ABNORMAL HIGH (ref 4.0–10.5)

## 2010-11-02 LAB — GLUCOSE, CAPILLARY: Glucose-Capillary: 287 mg/dL — ABNORMAL HIGH (ref 70–99)

## 2010-11-29 NOTE — Progress Notes (Signed)
Summary: PT HAS A YEAST INFECTION/dr Hunter Pinkard see  Phone Note Call from Patient Call back at Home Phone [phone]   Caller: Patient Reason for Call: Acute Illness, Talk to Nurse Summary of Call: DR Shadawn Hanaway// PT HAS A YEAST INFECTION. PT WOULD LIKE FLUCONAZOLE 150 MG TABS CALLED IN TO [facility] [phone]. PLEASE CALL PT WHEN RX HAS BEEN CALLED IN. PT SAYS LAST TIME SHE CALLED SHE ASKED FOR MORE THAN ONE DOSAGE AND SHE ONLY RECEIVED ONE. Initial call taken by: Charlcie Cradle,  October 11, 2007 12:20 PM  Follow-up for Phone Call        call diflucan 150 1 by mouth x1  1 rf OV if problems persist....................................................................Verneice Caspers E. Allahna Husband MD  October 11, 2007 2:35 PM          Appended Document: Orders Update    Clinical Lists Changes  Medications: Rx of DIFLUCAN 150 MG  TABS (FLUCONAZOLE) 1 by mouth;  #1 x 1;  Signed;  Entered by: Dawson Bills;  Authorized by: Alda Berthold Jeda Pardue MD;  Method used: Electronic    Prescriptions: DIFLUCAN 150 MG  TABS (FLUCONAZOLE) 1 by mouth  #1 x 1   Entered by:   Dawson Bills   Authorized by:   Alda Berthold. Starlyn Droge MD   Signed by:   Dawson Bills on 10/11/2007   Method used:   Electronically sent to ...       [facility]       [address]       [place], [place]  [address]       Ph: [phone]       Fax: [phone]   RxID:   [id number]

## 2010-11-29 NOTE — Miscellaneous (Signed)
Summary: Orders Update  Clinical Lists Changes  Orders: Added new Test order of Carotid Duplex (Carotid Duplex) - Signed 

## 2010-11-29 NOTE — Letter (Signed)
Summary: [facility] Instructions   Gastroenterology  [place], [place] [address]   Phone: [phone]  Fax: [phone]       [name] [name]    Dec 30, 1955    MRN: [id number]        Procedure Day /Date:TUESDAY   11/01/09     Arrival Time:10:00 AM     Procedure Time:11:00 AM    Location of Procedure:                    X  [facility] (4th Floor)        PREPARATION FOR COLONOSCOPY WITH MOVIPREP   Starting 5 days prior to your procedure 10/27/10 do not eat nuts, seeds, popcorn, corn, beans, peas,  salads, or any raw vegetables.  Do not take any fiber supplements (e.g. Metamucil, Citrucel, and Benefiber).  THE DAY BEFORE YOUR PROCEDURE         DATE:10/31/09  PYK:DXIPJA  1.  Drink clear liquids the entire day-NO SOLID FOOD  2.  Do not drink anything colored red or purple.  Avoid juices with pulp.  No orange juice.  3.  Drink at least 64 oz. (8 glasses) of fluid/clear liquids during the day to prevent dehydration and help the prep work efficiently.  CLEAR LIQUIDS INCLUDE: Water Jello Ice Popsicles Tea (sugar ok, no milk/cream) Powdered fruit flavored drinks Coffee (sugar ok, no milk/cream) Gatorade Juice: apple, white grape, white cranberry  Lemonade Clear bullion, consomm, broth Carbonated beverages (any kind) Strained chicken noodle soup Hard Candy                             4.  In the morning, mix first dose of MoviPrep solution:    Empty 1 Pouch A and 1 Pouch B into the disposable container    Add lukewarm drinking water to the top line of the container. Mix to dissolve    Refrigerate (mixed solution should be used within 24 hrs)  5.  Begin drinking the prep at 5:00 p.m. The MoviPrep container is divided by 4 marks.   Every 15 minutes drink the solution down to the next mark (approximately 8 oz) until the full liter is complete.   6.  Follow completed prep with 16 oz of clear liquid of your choice (Nothing red or purple).  Continue  to drink clear liquids until bedtime.  7.  Before going to bed, mix second dose of MoviPrep solution:    Empty 1 Pouch A and 1 Pouch B into the disposable container    Add lukewarm drinking water to the top line of the container. Mix to dissolve    Refrigerate  THE DAY OF YOUR PROCEDURE      DATE: 11/01/09 SNK:NLZJQBH  Beginning at 6:00 a.m. (5 hours before procedure):         1. Every 15 minutes, drink the solution down to the next mark (approx 8 oz) until the full liter is complete.  2. Follow completed prep with 16 oz. of clear liquid of your choice.    3. You may drink clear liquids until 9:00 AM (2 HOURS BEFORE PROCEDURE).   MEDICATION INSTRUCTIONS  Unless otherwise instructed, you should take regular prescription medications with a small sip of water   as early as possible the morning of your procedure.  Diabetic patients - see separate instructions.         OTHER INSTRUCTIONS  You will  need a responsible adult at least 55 years of age to accompany you and drive you home.   This person must remain in the waiting room during your procedure.  Wear loose fitting clothing that is easily removed.  Leave jewelry and other valuables at home.  However, you may wish to bring a book to read or  an iPod/MP3 player to listen to music as you wait for your procedure to start.  Remove all body piercing jewelry and leave at home.  Total time from sign-in until discharge is approximately 2-3 hours.  You should go home directly after your procedure and rest.  You can resume normal activities the  day after your procedure.  The day of your procedure you should not:   Drive   Make legal decisions   Operate machinery   Drink alcohol   Return to work  You will receive specific instructions about eating, activities and medications before you leave.    The above instructions have been reviewed and explained to me by   _______________________    I fully understand and  can verbalize these instructions _____________________________ Date _________

## 2010-11-29 NOTE — Progress Notes (Signed)
Summary: Refill Request  Phone Note Refill Request Message from:  Pharmacy on [address] Fax #: [phone]  Refills Requested: Medication #1:  LYRICA 75 MG CAPS 2 by mouth two times a day   Dosage confirmed as above?Dosage Confirmed   Supply Requested: 3 months   Last Refilled: 12/13/2009 Initial call taken by: Elna Breslow,  December 20, 2009 9:04 AM  Follow-up for Phone Call        gave 120 on 11/03/09. last ov- 09/10/09. [place]  December 20, 2009 9:33 AM   Additional Follow-up for Phone Call Additional follow up Details #1::        ok to refill x1   3 refills  Additional Follow-up by: Garnet Koyanagi DO,  December 20, 2009 10:31 AM    Prescriptions: LYRICA 75 MG CAPS (PREGABALIN) 2 by mouth two times a day  #120 x 0   Entered by:   Allyn Kenner CMA   Authorized by:   Garnet Koyanagi DO   Signed by:   Allyn Kenner CMA on 12/20/2009   Method used:   Printed then faxed to ...       [facility].* (retail)       [phone]       [place], [place]  [address]       Ph: [phone]       Fax: [phone]   RxID:   [id number]   Appended Document: Refill Request    Clinical Lists Changes  Medications: Rx of LYRICA 75 MG CAPS (PREGABALIN) 2 by mouth two times a day;  #120 x 3;  Signed;  Entered by: Allyn Kenner CMA;  Authorized by: Garnet Koyanagi DO;  Method used: Printed then faxed to [facility] [phone] [place], [place], [place]  [address], Ph: [phone], Fax: [phone]    Prescriptions: LYRICA 75 MG CAPS (PREGABALIN) 2 by mouth two times a day  #120 x 3   Entered by:   Allyn Kenner CMA   Authorized by:   Garnet Koyanagi DO   Signed by:   Allyn Kenner CMA on 12/20/2009   Method used:   Printed then faxed to ...       [facility].* (retail)       [phone]       [place], [place]  [address]       Ph:  [phone]       Fax: [phone]   RxID:   [id number]  Pt needed refills. [place]  December 20, 2009 10:35 AM

## 2010-11-29 NOTE — Progress Notes (Signed)
Summary: Ultram   Phone Note Call from Patient Call back at Home Phone [phone]   Caller: Patient Call For: Garnet Koyanagi DO Summary of Call: Pt states that she cannot afford to see Orthopedic doctor- they require money up front. She states that she has no insurance. She does not know what else to do. She says now that she is only taking 3 pils sometimes. Please advise. Allyn Kenner CMA  February 01, 2010 2:00 PM   Follow-up for Phone Call        Is there anyone that we can get her into that would be willing to work with her.  She can not take 3 ultram at a time---she will kill her kidneys and liver.  If she is in that much pain she needs to come back in. Follow-up by: Garnet Koyanagi DO,  February 01, 2010 3:19 PM  Additional Follow-up for Phone Call Additional follow up Details #1::        Pt has appt thursday to discuss. [place]  February 01, 2010 3:49 PM

## 2010-11-29 NOTE — Progress Notes (Signed)
Summary: Symptoms are the same  Phone Note Outgoing Call Call back at [facility] Phone [phone]   Call placed by: Aron Baba CMA Deborra Medina),  July 06, 2010 2:44 PM Details for Reason: Results 9/7 Summary of Call: ok except glucose is elevated---how is pt feeling?  Left message to call back.......[name] Kitchen Aron Baba CMA Deborra Medina)  July 06, 2010 2:44 PM  spk with pt and adv of her glucose, per pt it is always high. Says she is still in 7/10 pain  in the back and radiates to the stomach and she feels swollen in the abdomen area. Pt is scheduled for a CT today at 4:15. Adv pt to keep appt and I will give Dr.Lowne the message and when results come back we can further treat. Pt voiced understanding, says she will continue to take her meds and I adv if symptoms get worst call back for an Eval. Pt agreed.     Aron Baba CMA Deborra Medina)  July 06, 2010 3:17 PM     Follow-up for Phone Call        pt aware of CT results-----cirrhosis inform pt she is constipated and had a lot of gas in colon---  she should use stool softener and miralax and see if she can get any relief---call if no results--pt aware Follow-up by: Garnet Koyanagi DO,  July 06, 2010 5:09 PM  Additional Follow-up for Phone Call Additional follow up Details #1::        spk with pt and she wanted to know what we were going to do next about the Cirrhosis , she was upset and crying and thinks she is going to die, wanted to know if she should stop Hydroco atat 08/01/24 mg, wanted to know if there is anything different she can take for the pain and what type of treatment will she need. Please advise Additional Follow-up by: Aron Baba CMA Deborra Medina),  July 08, 2010 9:09 AM    Additional Follow-up for Phone Call Additional follow up Details #2::    I d/w this with her on the phone the day of the test--- we are setting up an appointment with GI to further evaluate.  Follow-up by: Garnet Koyanagi DO,  July 08, 2010 9:32  AM  Additional Follow-up for Phone Call Additional follow up Details #3:: Details for Additional Follow-up Action Taken: spk with pt and advised her if she can stand being off of the pain meds to discontinue, adv to continue taking the stool softener. Advise when she is evaluated with GI they will be able to further direct her with a diagnosis and treatment. Pt voiced understanding and agreed, said she will call GI for a sooner appt Additional Follow-up by: Aron Baba CMA Deborra Medina),  July 08, 2010 10:56 AM

## 2010-11-29 NOTE — Letter (Signed)
Summary: Diabetic Instructions  [facility]  [place], [place] [address]   Phone: [phone]  Fax: [phone]    [name] [name] [birth year] MRN: [id number]   x  ORAL DIABETIC MEDICATION INSTRUCTIONS  The day before your procedure:   Take your diabetic pill as you do normally  The day of your procedure:   Do not take your diabetic pill    We will check your blood sugar levels during the admission process and again in Recovery before discharging you home  ________________________________________________________________________  x   INSULIN (LONG ACTING) MEDICATION INSTRUCTIONS (Lantus, NPH, 70/30, Humulin, Novolin-N)   The day before your procedure:   Take your regular evening dose    The day of your procedure:   Do not take your morning dose    _  _   INSULIN (SHORT ACTING) MEDICATION INSTRUCTIONS (Regular, Humulog, Novolog)   The day before your procedure:   Do not take your evening dose   The day of your procedure:   Do not take your morning dose   _  _   INSULIN PUMP MEDICATION INSTRUCTIONS  We will contact the physician managing your diabetic care for written dosage instructions for the day before your procedure and the day of your procedure.  Once we have received the instructions, we will contact you.

## 2010-11-29 NOTE — Assessment & Plan Note (Signed)
Summary: Discuss pain meds/drb   Vital Signs:  Patient profile:   55 year old female Weight:      164 pounds Temp:     98.2 degrees F oral Pulse rate:   86 / minute Pulse rhythm:   regular BP sitting:   126 / 80  (left arm) Cuff size:   regular  Vitals Entered By: [place] (Mar 14, 2010 1:56 PM) CC: Pt here having a lot of pain in both of her legs. She was vomitting over the weekend, hands are numb. Pt had 102.0 temp this weekend. Pt occasionally urinates bright red blood.  CBG Result 322   History of Present Illness:  Pt c/o Increased [name] [name] symptoms--she has not been taking protonix. She c/o blood in urine last night and some nausea.  She also can not afford Lyrica.  She has appointment with Neuro on Thursday.  Pt c/o increased numbness and pain in legs, feet and hands.   Current Medications (verified): 1)  Metformin Hcl 1000 Mg Tabs (Metformin Hcl) .[name] Kitchen.. 1 By Mouth Two Times A Day 2)  Xanax 0.5 Mg Tabs (Alprazolam) .[name] Kitchen.. 1 By Mouth Qhs 3)  Aspir-Low 81 Mg  Tbec (Aspirin) 4)  Omeprazole 20 Mg Cpdr (Omeprazole) .[name] Kitchen.. 1 By Mouth Once Daily 5)  Humulin 70/30 70-30 % Susp (Insulin Isophane & Regular) .... 45 Units Qam, and 35 Units Qpm 6)  Insulin Syringes .... Two Times A Day Dx 250.00 7)  Vitamin D3 2000 Unit Caps (Cholecalciferol) .... Take 1 Tab By Mouth Once Daily 8)  Vitamin B-12 1000 Mcg Tabs (Cyanocobalamin) .... Take 1 Tab By Mouth Two Times A Day 9)  Calcium 600 600 Mg Tabs (Calcium Carbonate) .... Take 1 Tab By Mouth Once Daily 10)  Naprosyn 500 Mg Tabs (Naproxen) .[name] Kitchen.. 1 Two Times A Day As Needed For Pain and Inflammation.  Take W/ Food To Avoid Upset Stomach. 11)  Lexapro 10 Mg Tabs (Escitalopram Oxalate) .[name] Kitchen.. 1 By Mouth Once Daily 12)  Pravachol 40 Mg Tabs (Pravastatin Sodium) .[name] Kitchen.. 1 By Mouth At Bedtime. 13)  Vicodin 5-500 Mg Tabs (Hydrocodone-Acetaminophen) .[name] Kitchen.. 1 By Mouth Every 6 Hours As Needed 14)  Mvi 15)  Neurontin 300 Mg Caps (Gabapentin) .[name] Kitchen.. 1 By Mouth  Three Times A Day 16)  Onetouch Ultra Test  Strp (Glucose Blood) .... Two Times A Day  Allergies: 1)  ! Pravachol 2)  Penicillin G Potassium (Penicillin G Potassium)  Past History:  Past medical, surgical, family and social histories (including risk factors) reviewed for relevance to current acute and chronic problems.  Past Medical History: Reviewed history from 06/03/2008 and no changes required. Allergic rhinitis Diabetes mellitus, type II Hypertension Anxiety Hyperlipidemia  Past Surgical History: Reviewed history from 12/23/2006 and no changes required. Hysterectomy Oophorectomy Cholecystectomy (1995) Caesarean section x 2  Family History: Reviewed history from 07/03/2007 and no changes required. brother is deceased at age 18.  He had diabetes and renal insufficiency brother had early heart attack at age 56 no history colon or breast cancer father had leukemia, heart disease and diabetes but he live until  he was about 55 years old according with the patient mother has diabetes, hypertension, mental problems.  Social History: Reviewed history from 02/19/2009 and no changes required. Married one child she also lost a child in the 63s. non-smoker alcohol is rare  Review of Systems      See HPI  Physical Exam  General:  Well-developed,well-nourished,in no acute distress; alert,appropriate and cooperative throughout  examination Neck:  No deformities, masses, or tenderness noted. Lungs:  Normal respiratory effort, chest expands symmetrically. Lungs are clear to auscultation, no crackles or wheezes. Heart:  normal rate and no murmur.   Abdomen:  soft and epigastric tenderness.   + relief with GI cocktail Psych:  Cognition and judgment appear intact. Alert and cooperative with normal attention span and concentration. No apparent delusions, illusions, hallucinations   Impression & Recommendations:  Problem # 1:  DIABETIC PERIPHERAL NEUROPATHY (ICD-250.60)  Pt  to f/u with neuro.  Her updated medication list for this problem includes:    Metformin Hcl 1000 Mg Tabs (Metformin hcl) .[name] Kitchen... 1 by mouth two times a day    Aspir-low 81 Mg Tbec (Aspirin)    Humulin 70/30 70-30 % Susp (Insulin isophane & regular) .[name] KitchenMarland KitchenMarland KitchenMarland Kitchen 45 units qam, and 35 units qpm  Labs Reviewed: Creat: 0.8 (09/10/2009)    Reviewed HgBA1c results: 14.0 (09/10/2009)  13.2 (01/28/2009)  Problem # 2:  DIABETES MELLITUS, TYPE II (ICD-250.00) Assessment: Deteriorated f/u ENDO  Her updated medication list for this problem includes:    Metformin Hcl 1000 Mg Tabs (Metformin hcl) .[name] Kitchen... 1 by mouth two times a day    Aspir-low 81 Mg Tbec (Aspirin)    Humulin 70/30 70-30 % Susp (Insulin isophane & regular) .[name] KitchenMarland KitchenMarland KitchenMarland Kitchen 45 units qam, and 35 units qpm  Labs Reviewed: Creat: 0.8 (09/10/2009)    Reviewed HgBA1c results: 14.0 (09/10/2009)  13.2 (01/28/2009)  Problem # 3:  HEMATURIA UNSPECIFIED (ICD-599.70)  Orders: Venipuncture (401[address]) T-Culture, Urine ([address]-64403)  Problem # 4:  DYSPEPSIA (ICD-536.8)  Orders: Gastroenterology Referral (GI)  Problem # 5:  DIABETIC PERIPHERAL NEUROPATHY (ICD-250.60) Pt unable to afford lyrica--- switch to neurontin Her updated medication list for this problem includes:    Metformin Hcl 1000 Mg Tabs (Metformin hcl) .[name] Kitchen... 1 by mouth two times a day    Aspir-low 81 Mg Tbec (Aspirin)    Humulin 70/30 70-30 % Susp (Insulin isophane & regular) .[name] KitchenMarland KitchenMarland KitchenMarland Kitchen 45 units qam, and 35 units qpm  Labs Reviewed: Creat: 0.8 (09/10/2009)    Reviewed HgBA1c results: 14.0 (09/10/2009)  13.2 (01/28/2009)  Complete Medication List: 1)  Metformin Hcl 1000 Mg Tabs (Metformin hcl) .[name] Kitchen.. 1 by mouth two times a day 2)  Xanax 0.5 Mg Tabs (Alprazolam) .[name] Kitchen.. 1 by mouth qhs 3)  Aspir-low 81 Mg Tbec (Aspirin) 4)  Omeprazole 20 Mg Cpdr (Omeprazole) .[name] Kitchen.. 1 by mouth once daily 5)  Humulin 70/30 70-30 % Susp (Insulin isophane & regular) .... 45 units qam, and 35 units qpm 6)  Insulin Syringes   .... Two times a day dx 250.00 7)  Vitamin D3 2000 Unit Caps (Cholecalciferol) .... Take 1 tab by mouth once daily 8)  Vitamin B-12 1000 Mcg Tabs (Cyanocobalamin) .... Take 1 tab by mouth two times a day 9)  Calcium 600 600 Mg Tabs (Calcium carbonate) .... Take 1 tab by mouth once daily 10)  Naprosyn 500 Mg Tabs (Naproxen) .[name] Kitchen.. 1 two times a day as needed for pain and inflammation.  take w/ food to avoid upset stomach. 11)  Lexapro 10 Mg Tabs (Escitalopram oxalate) .[name] Kitchen.. 1 by mouth once daily 12)  Pravachol 40 Mg Tabs (Pravastatin sodium) .[name] Kitchen.. 1 by mouth at bedtime. 13)  Vicodin 5-500 Mg Tabs (Hydrocodone-acetaminophen) .[name] Kitchen.. 1 by mouth every 6 hours as needed 14)  Mvi  15)  Neurontin 300 Mg Caps (Gabapentin) .[name] Kitchen.. 1 by mouth three times a day 16)  Onetouch Ultra Test Strp (Glucose blood) .... Two times  a day  Other Orders: T-Lumbar Spine 2 Views (72100TC) Capillary Blood Glucose/CBG (38250)  Patient Instructions: 1)  Increase your insulin to 50 u in am and 40 u in pm---Call Dr Loanne Drilling for f/u Prescriptions: VICODIN 5-500 MG TABS (HYDROCODONE-ACETAMINOPHEN) 1 by mouth every 6 hours as needed  #60 x 0   Entered by:   Allyn Kenner CMA   Authorized by:   Garnet Koyanagi DO   Signed by:   Allyn Kenner CMA on 03/14/2010   Method used:   Reprint   RxID:   [id number] VICODIN 5-500 MG TABS (HYDROCODONE-ACETAMINOPHEN) 1 by mouth every 6 hours as needed  #60 x 0   Entered and Authorized by:   Garnet Koyanagi DO   Signed by:   Garnet Koyanagi DO on 03/14/2010   Method used:   Reprint   RxID:   [id number] VICODIN 5-500 MG TABS (HYDROCODONE-ACETAMINOPHEN) 1 by mouth every 6 hours as needed  #60 x 0   Entered and Authorized by:   Garnet Koyanagi DO   Signed by:   Garnet Koyanagi DO on 03/14/2010   Method used:   Print then Give to Patient   RxID:   [id number] ONETOUCH ULTRA TEST  STRP (GLUCOSE BLOOD) two times a day  #60 x 11   Entered by:   Allyn Kenner CMA   Authorized by:   Garnet Koyanagi DO   Signed by:   Allyn Kenner CMA on 03/14/2010   Method used:   Electronically to        [facility] (retail)       [phone]       [place], [place]  [address]       Ph: [phone]       Fax: [phone]   RxID:   [id number] OMEPRAZOLE 20 MG CPDR (OMEPRAZOLE) 1 by mouth once daily  #30 x 11   Entered and Authorized by:   Garnet Koyanagi DO   Signed by:   Garnet Koyanagi DO on 03/14/2010   Method used:   Electronically to        [facility].* (retail)       [phone]       [place], [place]  [address]       Ph: [phone]       Fax: [phone]   RxID:   [id number] NEURONTIN 300 MG CAPS (GABAPENTIN) 1 by mouth three times a day  #90 x 2   Entered and Authorized by:   Garnet Koyanagi DO   Signed by:   Garnet Koyanagi DO on 03/14/2010   Method used:   Electronically to        [facility].* (retail)       [phone]       [place], [place]  [address]       Ph: [phone]       Fax: [phone]   RxID:   [id number]   Laboratory Results   Urine Tests    Routine Urinalysis   Color: yellow Appearance: Clear Glucose: 500   (Normal Range: Negative) Bilirubin: negative   (Normal Range: Negative) Ketone: negative   (Normal Range: Negative) Spec. Gravity: 1.015   (Normal Range: 1.003-1.035) Blood: negative   (Normal Range: Negative) pH: 6.0   (Normal Range: 5.0-8.0) Protein: negative   (Normal  Range: Negative) Urobilinogen: 0.2   (Normal Range: 0-1) Nitrite: negative   (Normal Range: Negative) Leukocyte Esterace: negative   (Normal Range: Negative)    CommentsAllyn Kenner CMA  Mar 14, 2010 2:23 PM   Blood Tests     CBG Random:: 354m/dL

## 2010-11-29 NOTE — Progress Notes (Signed)
Summary: lmtc 7-20-refill request  Phone Note Outgoing Call   Call placed by: [facility] CMA,  May 18, 2010 10:40 AM Details for Reason: If back pain is still bad --- order MRI Ls spine too soon for refill---last refill 04/27/2010---- soonest it can be refilled is friday  Summary of Call: left message to call  office......[name] KitchenFelecia Deloach CMA  May 18, 2010 10:40 AM   Follow-up for Phone Call        pt states that it is not her back it is in both foots and legs now. pt states that she is unable to sit or sleep at night.pt coming in tomorrow to discuss issue.Babs Sciara Deloach CMA  May 18, 2010 2:05 PM

## 2010-11-29 NOTE — Progress Notes (Signed)
Summary: REFILL REQUEST  Phone Note Refill Request Message from:  Patient on June 09, 2010 9:08 AM  Refills Requested: Medication #1:  NORCO 10-325 MG TABS 1 by mouth q6 hours as needed   Dosage confirmed as above?Dosage Confirmed   Supply Requested: 1 month   Last Refilled: 05/19/2010   Notes: PT NEEDS RX ASAP. [place] T[place]ROW. [place]  Initial call taken by: Osborn Coho,  June 09, 2010 9:09 AM Caller: Patient  Follow-up for Phone Call        05-19-10 last ov, last filled #90.......[name] KitchenFelecia Deloach CMA  June 09, 2010 11:18 AM   Additional Follow-up for Phone Call Additional follow up Details #1::        pt should not have gone through 90 pills in 20 days.  will refill 30, no refills- if needs meds after that will need appt w/ Dr Etter Sjogren Additional Follow-up by: Annye Asa MD,  June 09, 2010 11:53 AM    Additional Follow-up for Phone Call Additional follow up Details #2::    PT AWARE..............[name] KitchenFelecia Deloach CMA  June 09, 2010 3:23 PM   Prescriptions: NORCO 10-325 MG TABS (HYDROCODONE-ACETAMINOPHEN) 1 by mouth q6 hours as needed  #30 x 0   Entered by:   Rolla Flatten CMA   Authorized by:   Annye Asa MD   Signed by:   Rolla Flatten CMA on 06/09/2010   Method used:   Printed then faxed to ...       [facility].* (retail)       [phone]       [place], [place]  [address]       Ph: [phone]       Fax: [phone]   RxID:   [id number]

## 2010-11-29 NOTE — Progress Notes (Signed)
Summary: not better  Phone Note Call from Patient Call back at Home Phone [phone]   Caller: Patient Summary of Call: patient was tod to call back  if she continued to have nausea  &  pain - her appt with dr Kathrine Cords is [phone]- she doesnt want anything that will affect her liver -[facility] [address]  Initial call taken by: Arbie [place],  July 14, 2010 1:32 PM  Follow-up for Phone Call        spoke w/ patient says she is still having problems w/ nausea and would like to get medication called in since her appt w/ GI isn't until next month says the constipation has resolved but just very nauseous...[name] KitchenMarland KitchenMalachi Bonds CMA  July 14, 2010 1:41 PM   Additional Follow-up for Phone Call Additional follow up Details #1::        phenergan 25 mg 1 by mouth qid as needed  #30  Additional Follow-up by: Garnet Koyanagi DO,  July 14, 2010 3:20 PM    Additional Follow-up for Phone Call Additional follow up Details #2::    patient aware prescription called into pharmacy....[name] KitchenMarland KitchenMalachi Bonds CMA  July 14, 2010 4:29 PM   New/Updated Medications: PROMETHAZINE HCL 25 MG TABS (PROMETHAZINE HCL) take one tablet qid as needed Prescriptions: PROMETHAZINE HCL 25 MG TABS (PROMETHAZINE HCL) take one tablet qid as needed  #30 x 0   Entered by:   Malachi Bonds CMA   Authorized by:   Garnet Koyanagi DO   Signed by:   Malachi Bonds CMA on 07/14/2010   Method used:   Electronically to        [facility].* (retail)       [phone]       [place], [place]  [address]       Ph: [phone]       Fax: [phone]   RxID:   [id number]

## 2010-11-29 NOTE — Progress Notes (Signed)
Summary: FYI  Phone Note Outgoing Call   Call placed by: Aron Baba CMA Deborra Medina),  August 09, 2010 11:40 AM Call placed to: Patient Details for Reason: Results Summary of Call: Called to pt to give her the UC results, says she went to the ED due to the pain in her back and abdomen  being so bad. Says no X-rays were done, they checked her urine and drew labs and pt was advised everything was normal and to f/u with Dr.Lowne for muscle spasms. Pt will call in later today for and OV for later this week. Initial call taken by: Aron Baba CMA Deborra Medina),  August 09, 2010 11:42 AM

## 2010-11-29 NOTE — Progress Notes (Signed)
Summary: new pain med   Phone Note Call from Patient Call back at Home Phone [phone]   Summary of Call: would like some pain medication that doesn't affect liver was given some options from pharmacy where hydrocodone/ibruprofen, dilaudid, oxycodone/asprin, and oxyir  [facility] [address]  Initial call taken by: Malachi Bonds CMA,  July 18, 2010 4:02 PM  Follow-up for Phone Call        see med list Follow-up by: Garnet Koyanagi DO,  July 18, 2010 4:40 PM  Additional Follow-up for Phone Call Additional follow up Details #1::        spoke w/ patient aware prescription sent to pharmacy........[name] KitchenMalachi Bonds CMA  July 18, 2010 4:44 PM     New/Updated Medications: VICOPROFEN 7.5-200 MG TABS (HYDROCODONE-IBUPROFEN) 1 by mouth every 6 hours as needed Prescriptions: VICOPROFEN 7.5-200 MG TABS (HYDROCODONE-IBUPROFEN) 1 by mouth every 6 hours as needed  #30 x 0   Entered by:   Malachi Bonds CMA   Authorized by:   Garnet Koyanagi DO   Signed by:   Malachi Bonds CMA on 07/18/2010   Method used:   Print then Give to Patient   RxID:   [id number] VICOPROFEN 7.5-200 MG TABS (HYDROCODONE-IBUPROFEN) 1 by mouth every 6 hours as needed  #30 x 0   Entered by:   Garnet Koyanagi DO   Authorized by:   Malachi Bonds CMA   Signed by:   Garnet Koyanagi DO on 07/18/2010   Method used:   Print then Give to Patient   RxID:   [id number]

## 2010-11-29 NOTE — Progress Notes (Signed)
Summary: Lyrica Refill request   Phone Note Refill Request Message from:  Fax from Pharmacy on November 01, 2009 4:23 PM  Refills Requested: Medication #1:  LYRICA 75 MG CAPS 2 by mouth two times a day   Dosage confirmed as above?Dosage Confirmed   Brand Name Necessary? Yes   Supply Requested: 3 months call into [facility] [address] in [place]  Initial call taken by: Otho Ket,  November 01, 2009 4:24 PM  Follow-up for Phone Call        pt said that the pharmacy did not recieve the request on 10/15/09.  Is this okay to send it back in?  I called the pharmacy and they stated on 10/31/09 she came and picked up 12. They did not receive the refill on 10/15/09. Follow-up by: Allyn Kenner CMA,  November 01, 2009 4:28 PM  Additional Follow-up for Phone Call Additional follow up Details #1::        ok to fill--- must be printed and faxed---- this cannot be sent electronically Additional Follow-up by: Garnet Koyanagi DO,  November 01, 2009 7:54 PM    Prescriptions: LYRICA 75 MG CAPS (PREGABALIN) 2 by mouth two times a day  #120 x 0   Entered by:   Allyn Kenner CMA   Authorized by:   Garnet Koyanagi DO   Signed by:   Allyn Kenner CMA on 11/02/2009   Method used:   Printed then faxed to ...       [facility].* (retail)       [phone]       [place],   [address]       Ph: [phone]       Fax: [phone]   RxID:   [id number]

## 2010-11-29 NOTE — Consult Note (Signed)
Summary: [facility]  [facility]   Imported By: Edmonia James 09/27/2010 09:27:33  _____________________________________________________________________  External Attachment:    Type:   Image     Comment:   External Document

## 2010-11-29 NOTE — Assessment & Plan Note (Signed)
Summary: CHANGE PAIN MED PER DR GAY/RH......   Vital Signs:  Patient profile:   55 year old female Weight:      170.8 pounds Temp:     98.2 degrees F oral Pulse rate:   80 / minute Pulse rhythm:   regular BP sitting:   124 / 80  (left arm) Cuff size:   regular  Vitals Entered By: Aron Baba CMA Deborra Medina) (September 16, 2010 10:42 AM) CC: wants to discuss meds and headaches   History of Present Illness: Pt here to discuss rheumatology visit.  Pt states rheum wanted her to take different pain med.  His note said for pt to stop neuontin and start cymbalta.   Pt is only taking 1 vicoprofen a day if needed.  Pt did not know she should stop neurontin.  Pt states he also wanted her to have xrays of hands, feet and ankles.  Apparently there  were swollen when she was there--- they are not now.  Current Medications (verified): 1)  Metformin Hcl 1000 Mg Tabs (Metformin Hcl) .[name] Kitchen.. 1 By Mouth Two Times A Day 2)  Omeprazole 20 Mg Cpdr (Omeprazole) .[name] Kitchen.. 1 By Mouth Once Daily 3)  Humulin 70/30 70-30 % Susp (Insulin Isophane & Regular) .... 60 Units Qam, and 35 Units Qpm 4)  Insulin Syringes .... Two Times A Day Dx 250.00 5)  Vitamin B-12 1000 Mcg Tabs (Cyanocobalamin) .... Take 1 Tab By Mouth Two Times A Day 6)  Calcium 600 600 Mg Tabs (Calcium Carbonate) .... Take 1 Tab By Mouth Once Daily 7)  Lexapro 10 Mg Tabs (Escitalopram Oxalate) .[name] Kitchen.. 1 By Mouth Once Daily 8)  Pravachol 40 Mg Tabs (Pravastatin Sodium) .[name] Kitchen.. 1 By Mouth At Bedtime. 9)  Vicoprofen 7.5-200 Mg Tabs (Hydrocodone-Ibuprofen) .[name] Kitchen.. 1 By Mouth Every 6 Hours As Needed 10)  Multivitamins  Caps (Multiple Vitamin) .... One Tablet By Mouth Once Daily 11)  Neurontin 300 Mg Caps (Gabapentin) .... 2 By Mouth Three Times A Day 12)  Onetouch Ultra Test  Strp (Glucose Blood) .... Two Times A Day 13)  Promethazine Hcl 25 Mg Tabs (Promethazine Hcl) .... Take One Tablet Qid As Needed 14)  Caltrate 600+d 600-400 Mg-Unit Tabs (Calcium Carbonate-Vitamin  D) .... One Tablet By Mouth Two Times A Day 15)  Miralax  Powd (Polyethylene Glycol 3350) .... Once Daily 16)  Stool Softener 100 Mg Caps (Docusate Sodium) .... One Capsule By Mouth Once Daily 17)  Cymbalta 30 Mg Cpep (Duloxetine Hcl) .[name] Kitchen.. 1 By Mouth Once Daily X7days and Then 27m By Mouth Once Daily  Allergies (verified): 1)  ! 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 of HEMOPTYSIS (ICD-786.3) HYPERLIPIDEMIA (ICD-272.4) HYPERTENSION (ICD-401.9) DIABETES MELLITUS, TYPE II (ICD-250.00) ALLERGIC RHINITIS (ICD-477.9) ANXIETY (ICD-300.00) NECK PAIN (ICD-723.1) FAMILY HISTORY OF CAD FEMALE 1ST DEGREE RELATIVE <50 (ICD-V17.3) HEALTH SCREENING (ICD-V70.0) CARDIAC CATHETERIZATION, LEFT, HX OF (ICD-V15.2) CHEST PAIN (ICD-786.50) Depression Fibromyalgia  Past Surgical History: Last updated: 12/23/2006 Hysterectomy Oophorectomy Cholecystectomy (1995) Caesarean section x 2  Family History: Last updated: 12011-10-22brother is deceased at age 55  He had diabetes and renal insufficiency brother had early heart attack at age 6414no history colon  or breast cancer father had leukemia, heart disease and diabetes but he live until  he was about 55 years old according with the patient mother has diabetes, hypertension, mental problems. Family History of Irritable Bowel Syndrome:Daughter  Family History of Colitis/Crohn's:Father   Social History: Last updated: 08/19/2010 widowed  once, and divorced once one child (1980) she also lost a child in the 78s. 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 Daily Caffeine Use: one daily   Risk Factors: Smoking Status: never (07/03/2007)  Family History: Reviewed history from 08/19/2010 and no changes required. brother is deceased at age 30.  He had diabetes and renal insufficiency brother had early heart attack at age 74 no history colon or breast cancer father had leukemia, heart disease and diabetes but he live until  he was about 55 years old according with the patient mother has diabetes, hypertension, mental problems. Family History of Irritable Bowel Syndrome:Daughter  Family History of Colitis/Crohn's:Father   Social History: Reviewed history from 08/19/2010 and no changes required. widowed once, and divorced once one child (1980) she also lost a child in the 54s. 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 Daily Caffeine Use: one daily   Review of Systems      See HPI  Physical Exam  General:  Well-developed,well-nourished,in no acute distress; alert,appropriate and cooperative throughout examination Psych:  Oriented X3, normally interactive, not anxious appearing, and not depressed appearing.     Impression & Recommendations:  Problem # 1:  MYALGIA (ICD-729.1)  Her updated medication list for this problem includes:    Vicoprofen 7.5-200 Mg Tabs (Hydrocodone-ibuprofen) .[name] Kitchen... 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 Once daily    Stool Softener 100 Mg Caps  (Docusate sodium) ..... One capsule by mouth once daily  Complete Medication List: 1)  Metformin Hcl 1000 Mg Tabs (Metformin hcl) .[name] Kitchen.. 1 by mouth two times a day 2)  Omeprazole 20 Mg Cpdr (Omeprazole) .[name] Kitchen.. 1 by mouth once daily 3)  Humulin 70/30 70-30 % Susp (Insulin isophane & regular) .... 60 units qam, and 35 units qpm 4)  Insulin Syringes  .... Two times a day dx 250.00 5)  Vitamin B-12 1000 Mcg Tabs (Cyanocobalamin) .... Take 1 tab by mouth two times a day 6)  Calcium 600 600 Mg Tabs (Calcium carbonate) .... Take 1 tab by mouth once daily 7)  Lexapro 10 Mg Tabs (Escitalopram oxalate) .[name] Kitchen.. 1 by mouth once daily 8)  Pravachol 40 Mg Tabs (Pravastatin sodium) .[name] Kitchen.. 1 by mouth at bedtime. 9)  Vicoprofen 7.5-200 Mg Tabs (Hydrocodone-ibuprofen) .[name] Kitchen.. 1 by mouth every 6 hours as needed 10)  Multivitamins Caps (Multiple vitamin) .... One tablet by mouth once daily 11)  Neurontin 300 Mg Caps (Gabapentin) .... 2 by mouth three times a day 12)  Onetouch Ultra Test Strp (Glucose blood) .... Two times a day 13)  Promethazine Hcl 25 Mg Tabs (Promethazine hcl) .... Take one tablet qid as needed 14)  Caltrate 600+d 600-400 Mg-unit Tabs (Calcium carbonate-vitamin d) .... One tablet by mouth two times a day 15)  Miralax Powd (Polyethylene glycol 3350) .[name] KitchenMarland KitchenMarland Kitchen  Once daily 16)  Stool Softener 100 Mg Caps (Docusate sodium) .... One capsule by mouth once daily 17)  Cymbalta 30 Mg Cpep (Duloxetine hcl) .[name] Kitchen.. 1 by mouth once daily x7days and then 75m by mouth once daily Prescriptions: HUMULIN 70/30 70-30 % SUSP (INSULIN ISOPHANE & REGULAR) 60 units qam, and 35 units qpm  #10 x 1   Entered by:   KAron BabaCMA ([place]   Authorized by:   YGarnet KoyanagiDO   Signed by:   KAron BabaCMA ([place] on 09/16/2010   Method used:   Electronically to        [facility]* (retail)       [phone]       [place]  [address]      Ph: [phone]      Fax:  [phone]  RxID:   [id number]   Orders Added: 1)  T-Hand Right 3 views [73130TC] 2)  T-Hand Left 3 Views [73130TC] 3)  T-Ankle Comp Left Min 3 Views [73610TC] 4)  T-Ankle Comp Right [73610TC] 5)  T-Foot Left Min 3 Views [73630TC] 6)  T-Foot Right [73630TC] 7)  Est. Patient Level III [[38329]

## 2010-11-29 NOTE — Progress Notes (Signed)
Summary: refill  Phone Note Refill Request Message from:  Fax from Pharmacy  Refills Requested: Medication #1:  VICODIN 5-500 MG  TABS one or two p.o. q.i.d. p.r.n. [facility] PQ---[phone]         fax----[phone]  Initial call taken by: Despina Arias,  November 15, 2009 4:43 PM  Follow-up for Phone Call        last filled-10/15/09 #120, last ov- 08/2009 [place]  November 15, 2009 4:44 PM  Additional Follow-up for Phone Call Additional follow up Details #1::        pt should be seeing ortho for pain---- if they feel she needs that much vicodin they should fill it.   Additional Follow-up by: Garnet Koyanagi DO,  November 15, 2009 5:18 PM    Additional Follow-up for Phone Call Additional follow up Details #2::    Pt states that she cannot afford to see the ortho at this time. Allyn Kenner CMA  November 16, 2009 9:14 AM  Additional Follow-up for Phone Call Additional follow up Details #3:: Details for Additional Follow-up Action Taken: try ultram 50 mg 1-2 by mouth every 6 hours as needed ----#90--- not as strong as vicodin but we can't keep writing that much vicodin----  she really needs to see the ortho if she needs vicodin.  pt aware. Allyn Kenner CMA  November 17, 2009 8:00 AM Additional Follow-up by: Garnet Koyanagi DO,  November 16, 2009 5:07 PM  New/Updated Medications: ULTRAM 50 MG TABS (TRAMADOL HCL) 1-2 every 6hrs as needed for pain. Prescriptions: ULTRAM 50 MG TABS (TRAMADOL HCL) 1-2 every 6hrs as needed for pain.  #60 x 0   Entered by:   Allyn Kenner CMA   Authorized by:   Garnet Koyanagi DO   Signed by:   Allyn Kenner CMA on 11/17/2009   Method used:   Electronically to        [facility].* (retail)       [phone]       [place], [place]  [address]       Ph: [phone]       Fax: [phone]   RxID:   [id number]

## 2010-11-29 NOTE — Progress Notes (Signed)
Summary: Refill Request  Phone Note Refill Request Call back at [phone] Message from:  Pharmacy on April 11, 2010 10:19 AM  Refills Requested: Medication #1:  VICODIN 5-500 MG TABS 1 by mouth every 6 hours as needed   Dosage confirmed as above?Dosage Confirmed   Supply Requested: 1 month   Last Refilled: 03/14/2010 Shelbie Ammons  Next Appointment Scheduled: April 19 2010 Initial call taken by: Elna Breslow,  April 11, 2010 10:20 AM  Follow-up for Phone Call        last ov- 03/14/10. Allyn Kenner CMA  April 11, 2010 10:23 AM   Additional Follow-up for Phone Call Additional follow up Details #1::        refill x1 Additional Follow-up by: Garnet Koyanagi DO,  April 11, 2010 12:38 PM    Prescriptions: VICODIN 5-500 MG TABS (HYDROCODONE-ACETAMINOPHEN) 1 by mouth every 6 hours as needed  #60 x 0   Entered by:   Allyn Kenner CMA   Authorized by:   Garnet Koyanagi DO   Signed by:   Allyn Kenner CMA on 04/11/2010   Method used:   Printed then faxed to ...       [facility].* (retail)       [phone]       [place], [place]  [address]       Ph: [phone]       Fax: [phone]   RxID:   [id number]

## 2010-11-29 NOTE — Assessment & Plan Note (Signed)
Summary: DISCUSS PAIN AND ORTHO/DRB   Vital Signs:  Patient profile:   55 year old female Weight:      164 pounds Pulse rate:   98 / minute Pulse rhythm:   regular BP sitting:   122 / 86  (left arm) Cuff size:   regular  Vitals Entered By: Allyn Kenner CMA (February 03, 2010 12:51 PM) CC: Pt states her pain is not any better, her hands become numb, pain goes from her feet all the way up to her hips.   History of Present Illness: Pt here c/o numbness and pain in all 4 extremities and she had 2 syncopal episodes on the 17th and 31st.  She was out for 3 min.  Her daughter witnessed this.   No chest pain or SOB.    Current Medications (verified): 1)  Metformin Hcl 1000 Mg Tabs (Metformin Hcl) .[name] Kitchen.. 1 By Mouth Two Times A Day 2)  Xanax 0.5 Mg Tabs (Alprazolam) .[name] Kitchen.. 1 By Mouth Qhs 3)  Aspir-Low 81 Mg  Tbec (Aspirin) 4)  Protonix 40 Mg Tbec (Pantoprazole Sodium) .[name] Kitchen.. 1 By Mouth Once Daily 5)  Humulin 70/30 70-30 % Susp (Insulin Isophane & Regular) .... 45 Units Qam, and 35 Units Qpm 6)  Lyrica 75 Mg Caps (Pregabalin) .... 2 By Mouth Two Times A Day 7)  Insulin Syringes .... Two Times A Day Dx 250.00 8)  Vitamin D3 2000 Unit Caps (Cholecalciferol) .... Take 1 Tab By Mouth Once Daily 9)  Vitamin B-12 1000 Mcg Tabs (Cyanocobalamin) .... Take 1 Tab By Mouth Two Times A Day 10)  Calcium 600 600 Mg Tabs (Calcium Carbonate) .... Take 1 Tab By Mouth Once Daily 11)  Naprosyn 500 Mg Tabs (Naproxen) .[name] Kitchen.. 1 Two Times A Day As Needed For Pain and Inflammation.  Take W/ Food To Avoid Upset Stomach. 12)  Lexapro 10 Mg Tabs (Escitalopram Oxalate) .[name] Kitchen.. 1 By Mouth Once Daily 13)  Pravachol 40 Mg Tabs (Pravastatin Sodium) .[name] Kitchen.. 1 By Mouth At Bedtime. 14)  Vicodin 5-500 Mg Tabs (Hydrocodone-Acetaminophen) .[name] Kitchen.. 1 By Mouth Every 6 Hours As Needed 15)  Mvi  Allergies (verified): 1)  ! Pravachol 2)  Penicillin G Potassium (Penicillin G Potassium)  Past History:  Past medical, surgical, family and social  histories (including risk factors) reviewed for relevance to current acute and chronic problems.  Past Medical History: Reviewed history from 06/03/2008 and no changes required. Allergic rhinitis Diabetes mellitus, type II Hypertension Anxiety Hyperlipidemia  Past Surgical History: Reviewed history from 12/23/2006 and no changes required. Hysterectomy Oophorectomy Cholecystectomy (1995) Caesarean section x 2  Family History: Reviewed history from 07/03/2007 and no changes required. brother is deceased at age 61.  He had diabetes and renal insufficiency brother had early heart attack at age 3 no history colon or breast cancer father had leukemia, heart disease and diabetes but he live until  he was about 55 years old according with the patient mother has diabetes, hypertension, mental problems.  Social History: Reviewed history from 02/19/2009 and no changes required. Married one child she also lost a child in the 63s. non-smoker alcohol is rare  Review of Systems      See HPI  Physical Exam  General:  Well-developed,well-nourished,in no acute distress; alert,appropriate and cooperative throughout examination Eyes:  pupils equal, pupils round, pupils reactive to light, and no injection.   Ears:  External ear exam shows no significant lesions or deformities.  Otoscopic examination reveals clear canals, tympanic membranes are intact bilaterally without  bulging, retraction, inflammation or discharge. Hearing is grossly normal bilaterally. Neck:  No deformities, masses, or tenderness noted. Lungs:  Normal respiratory effort, chest expands symmetrically. Lungs are clear to auscultation, no crackles or wheezes. Heart:  normal rate and no murmur.   Msk:  normal ROM, no joint tenderness, no joint swelling, no joint warmth, no redness over joints, no joint deformities, no joint instability, and no crepitation.   Extremities:  No clubbing, cyanosis, edema, or deformity noted with  normal full range of motion of all joints.   Neurologic:  alert & oriented X3, strength normal in all extremities, gait normal, and DTRs symmetrical and normal.   Psych:  Oriented X3 and normally interactive.     Impression & Recommendations:  Problem # 1:  SYNCOPE (ICD-780.2)  Orders: Neurology Referral (Neuro) Doppler Referral (Doppler) EKG w/ Interpretation (93000)  Problem # 2:  NUMBNESS (ICD-782.0)  Orders: Neurology Referral (Neuro) Venipuncture (85277) T- * Misc. Laboratory test [phone]) EKG w/ Interpretation (93000)  Problem # 3:  MYALGIA (ICD-729.1)  The following medications were removed from the medication list:    Vicodin 5-500 Mg Tabs (Hydrocodone-acetaminophen) ..... One or two p.o. q.i.d. p.r.n. Her updated medication list for this problem includes:    Aspir-low 81 Mg Tbec (Aspirin)    Naprosyn 500 Mg Tabs (Naproxen) .[name] Kitchen... 1 two times a day as needed for pain and inflammation.  take w/ food to avoid upset stomach.    Vicodin 5-500 Mg Tabs (Hydrocodone-acetaminophen) .[name] Kitchen... 1 by mouth every 6 hours as needed  Orders: Neurology Referral (Neuro) Venipuncture [phone]) T- * Misc. Laboratory test [phone]) EKG w/ Interpretation (93000)  Complete Medication List: 1)  Metformin Hcl 1000 Mg Tabs (Metformin hcl) .[name] Kitchen.. 1 by mouth two times a day 2)  Xanax 0.5 Mg Tabs (Alprazolam) .[name] Kitchen.. 1 by mouth qhs 3)  Aspir-low 81 Mg Tbec (Aspirin) 4)  Protonix 40 Mg Tbec (Pantoprazole sodium) .[name] Kitchen.. 1 by mouth once daily 5)  Humulin 70/30 70-30 % Susp (Insulin isophane & regular) .... 45 units qam, and 35 units qpm 6)  Lyrica 75 Mg Caps (Pregabalin) .... 2 by mouth two times a day 7)  Insulin Syringes  .... Two times a day dx 250.00 8)  Vitamin D3 2000 Unit Caps (Cholecalciferol) .... Take 1 tab by mouth once daily 9)  Vitamin B-12 1000 Mcg Tabs (Cyanocobalamin) .... Take 1 tab by mouth two times a day 10)  Calcium 600 600 Mg Tabs (Calcium carbonate) .... Take 1 tab by mouth once  daily 11)  Naprosyn 500 Mg Tabs (Naproxen) .[name] Kitchen.. 1 two times a day as needed for pain and inflammation.  take w/ food to avoid upset stomach. 12)  Lexapro 10 Mg Tabs (Escitalopram oxalate) .[name] Kitchen.. 1 by mouth once daily 13)  Pravachol 40 Mg Tabs (Pravastatin sodium) .[name] Kitchen.. 1 by mouth at bedtime. 14)  Vicodin 5-500 Mg Tabs (Hydrocodone-acetaminophen) .[name] Kitchen.. 1 by mouth every 6 hours as needed 15)  Mvi  Prescriptions: LYRICA 75 MG CAPS (PREGABALIN) 2 by mouth two times a day  #120 x 5   Entered and Authorized by:   Garnet Koyanagi DO   Signed by:   Garnet Koyanagi DO on 02/03/2010   Method used:   Print then Give to Patient   RxID:   [id number] VICODIN 5-500 MG TABS (HYDROCODONE-ACETAMINOPHEN) 1 by mouth every 6 hours as needed  #60 x 0   Entered and Authorized by:   Garnet Koyanagi DO   Signed by:   Garnet Koyanagi DO  on 02/03/2010   Method used:   Print then Give to Patient   RxID:   [id number]    EKG  Procedure date:  02/03/2010  Findings:      Normal sinus rhythm with rate of:  94

## 2010-11-29 NOTE — Assessment & Plan Note (Signed)
Summary: pain in legs worse//fd   Vital Signs:  Patient profile:   55 year old female Height:      62 inches Weight:      172 pounds Temp:     98.4 degrees F oral Pulse rate:   86 / minute BP sitting:   140 / 82  (left arm)  Vitals Entered By: Rolla Flatten CMA (May 19, 2010 3:54 PM) CC: PAIN IN LEGS WORSE, REFILLS   History of Present Illness: Pt here c/o increasing pain in legs---pt has seen neuro and neurontin increased to 376m 2 by mouth three times a day --no relief.  pain meds not helping.    Current Medications (verified): 1)  Metformin Hcl 1000 Mg Tabs (Metformin Hcl) ..[name] Kitchen. 1 By Mouth Two Times A Day 2)  Xanax 0.5 Mg Tabs (Alprazolam) ..[name] Kitchen. 1 By Mouth Qhs 3)  Aspir-Low 81 Mg  Tbec (Aspirin) 4)  Omeprazole 20 Mg Cpdr (Omeprazole) ..[name] Kitchen. 1 By Mouth Once Daily 5)  Humulin 70/30 70-30 % Susp (Insulin Isophane & Regular) .... 60 Units Qam, and 35 Units Qpm 6)  Insulin Syringes .... Two Times A Day Dx 250.00 7)  Vitamin D3 2000 Unit Caps (Cholecalciferol) .... Take 1 Tab By Mouth Once Daily 8)  Vitamin B-12 1000 Mcg Tabs (Cyanocobalamin) .... Take 1 Tab By Mouth Two Times A Day 9)  Calcium 600 600 Mg Tabs (Calcium Carbonate) .... Take 1 Tab By Mouth Once Daily 10)  Lexapro 10 Mg Tabs (Escitalopram Oxalate) ..[name] Kitchen. 1 By Mouth Once Daily 11)  Pravachol 40 Mg Tabs (Pravastatin Sodium) ..[name] Kitchen. 1 By Mouth At Bedtime. 12)  Norco 10-325 Mg Tabs (Hydrocodone-Acetaminophen) ..[name] Kitchen. 1 By Mouth Q6 Hours As Needed 13)  Mvi 14)  Neurontin 300 Mg Caps (Gabapentin) .... 2 By Mouth Three Times A Day 15)  Onetouch Ultra Test  Strp (Glucose Blood) .... Two Times A Day 16)  Amitriptyline Hcl 50 Mg Tabs (Amitriptyline Hcl) ..[name] Kitchen. 1 By Mouth At Bedtime  Allergies (verified): 1)  ! Pravachol 2)  Penicillin G Potassium (Penicillin G Potassium)  Past History:  Past medical, surgical, family and social histories (including risk factors) reviewed for relevance to current acute and chronic problems.  Past  Medical History: Reviewed history from 06/03/2008 and no changes required. Allergic rhinitis Diabetes mellitus, type II Hypertension Anxiety Hyperlipidemia  Past Surgical History: Reviewed history from 12/23/2006 and no changes required. Hysterectomy Oophorectomy Cholecystectomy (1995) Caesarean section x 2  Family History: Reviewed history from 07/03/2007 and no changes required. brother is deceased at age 55  He had diabetes and renal insufficiency brother had early heart attack at age 778no history colon or breast cancer father had leukemia, heart disease and diabetes but he live until  he was about 55years old according with the patient mother has diabetes, hypertension, mental problems.  Social History: Reviewed history from 05/03/2010 and no changes required. 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  Review of Systems      See HPI  Physical Exam  General:  Well-developed,well-nourished,in no acute distress; alert,appropriate and cooperative throughout examination Ears:  R ear normal.   Msk:  no joint swelling, no joint warmth, no redness over joints, and no joint deformities.   Extremities:  No clubbing, cyanosis, edema, or deformity noted with normal full range of motion of all joints.   Neurologic:  alert & oriented X3,  gait normal, RLE sensory loss, and LLE sensory loss.     Impression & Recommendations:  Problem # 1:  DIABETIC PERIPHERAL NEUROPATHY (ICD-250.60)  Her updated medication list for this problem includes:    Metformin Hcl 1000 Mg Tabs (Metformin hcl) .[name] Kitchen... 1 by mouth two times a day    Aspir-low 81 Mg Tbec (Aspirin)    Humulin 70/30 70-30 % Susp (Insulin isophane & regular) .[name] KitchenMarland KitchenMarland KitchenMarland Kitchen 60 units qam, and 35 units qpm  Orders: Venipuncture (75102) TLB-Sedimentation Rate (ESR) (85652-ESR) T-Antinuclear Antib (ANA)  [phone]) TLB-TSH (Thyroid Stimulating Hormone) (84443-TSH) Specimen Handling (99000)  Labs Reviewed: Creat: 0.8 (09/10/2009)    Reviewed HgBA1c results: 14.0 (09/10/2009)  13.2 (01/28/2009)  Problem # 2:  MYALGIA (ICD-729.1)  The following medications were removed from the medication list:    Naprosyn 500 Mg Tabs (Naproxen) .[name] Kitchen... 1 two times a day as needed for pain and inflammation.  take w/ food to avoid upset stomach. Her updated medication list for this problem includes:    Aspir-low 81 Mg Tbec (Aspirin)    Norco 10-325 Mg Tabs (Hydrocodone-acetaminophen) .[name] Kitchen... 1 by mouth q6 hours as needed  Orders: Venipuncture (35361) TLB-Sedimentation Rate (ESR) (85652-ESR) T-Antinuclear Antib (ANA) (44315-40086) TLB-TSH (Thyroid Stimulating Hormone) (84443-TSH) TLB-BMP (Basic Metabolic Panel-BMET) (76195-KDTOIZT)  Problem # 3:  HYPERLIPIDEMIA (ICD-272.4)  Her updated medication list for this problem includes:    Pravachol 40 Mg Tabs (Pravastatin sodium) .[name] Kitchen... 1 by mouth at bedtime.    Labs Reviewed: SGOT: 22 (09/10/2009)   SGPT: 23 (09/10/2009)   HDL:34.90 (09/10/2009), 35.80 (01/28/2009)  LDL:134 (09/10/2009), 124 (01/28/2009)  Chol:198 (09/10/2009), 184 (01/28/2009)  Trig:144.0 (09/10/2009), 123.0 (01/28/2009)  Problem # 4:  HYPERTENSION (ICD-401.9)  BP today: 140/82 Prior BP: 112/66 (05/03/2010)  Labs Reviewed: K+: 4.4 (09/10/2009) Creat: : 0.8 (09/10/2009)   Chol: 198 (09/10/2009)   HDL: 34.90 (09/10/2009)   LDL: 134 (09/10/2009)   TG: 144.0 (09/10/2009)  Complete Medication List: 1)  Metformin Hcl 1000 Mg Tabs (Metformin hcl) .[name] Kitchen.. 1 by mouth two times a day 2)  Xanax 0.5 Mg Tabs (Alprazolam) .[name] Kitchen.. 1 by mouth qhs 3)  Aspir-low 81 Mg Tbec (Aspirin) 4)  Omeprazole 20 Mg Cpdr (Omeprazole) .[name] Kitchen.. 1 by mouth once daily 5)  Humulin 70/30 70-30 % Susp (Insulin isophane & regular) .... 60 units qam, and 35 units qpm 6)  Insulin Syringes  .... Two times a day dx 250.00 7)   Vitamin D3 2000 Unit Caps (Cholecalciferol) .... Take 1 tab by mouth once daily 8)  Vitamin B-12 1000 Mcg Tabs (Cyanocobalamin) .... Take 1 tab by mouth two times a day 9)  Calcium 600 600 Mg Tabs (Calcium carbonate) .... Take 1 tab by mouth once daily 10)  Lexapro 10 Mg Tabs (Escitalopram oxalate) .[name] Kitchen.. 1 by mouth once daily 11)  Pravachol 40 Mg Tabs (Pravastatin sodium) .[name] Kitchen.. 1 by mouth at bedtime. 12)  Norco 10-325 Mg Tabs (Hydrocodone-acetaminophen) .[name] Kitchen.. 1 by mouth q6 hours as needed 13)  Mvi  14)  Neurontin 300 Mg Caps (Gabapentin) .... 2 by mouth three times a day 15)  Onetouch Ultra Test Strp (Glucose blood) .... Two times a day 16)  Amitriptyline Hcl 50 Mg Tabs (Amitriptyline hcl) .[name] Kitchen.. 1 by mouth at bedtime Prescriptions: AMITRIPTYLINE HCL 50 MG TABS (AMITRIPTYLINE HCL) 1 by mouth at bedtime  #30 x 2   Entered and Authorized by:   Garnet Koyanagi DO   Signed by:   Garnet Koyanagi DO on 05/19/2010   Method used:   Electronically to  [facility] (retail)       [phone]       [place], [place]  [address]       Ph: [phone]       Fax: [phone]   RxID:   [id number] NORCO 10-325 MG TABS (HYDROCODONE-ACETAMINOPHEN) 1 by mouth q6 hours as needed  #90 x 0   Entered and Authorized by:   Garnet Koyanagi DO   Signed by:   Garnet Koyanagi DO on 05/19/2010   Method used:   Print then Give to Patient   RxID:   [id number] METFORMIN HCL 1000 MG TABS (METFORMIN HCL) 1 by mouth two times a day  #60 Each x 5   Entered and Authorized by:   Garnet Koyanagi DO   Signed by:   Garnet Koyanagi DO on 05/19/2010   Method used:   Electronically to        [facility] (retail)       [phone]       [place], [place]  [address]       Ph: [phone]       Fax: [phone]   RxID:   [id number]

## 2010-11-29 NOTE — Assessment & Plan Note (Signed)
Summary: Question hepatic cirrhosis on CT (new patient)   History of Present Illness Visit Type: consult  Primary GI MD: Scarlette Shorts MD Primary Provider: Garnet Koyanagi, DO  Requesting Provider: Garnet Koyanagi, DO  Chief Complaint: RLQ abd pain, constipation, bloating, and loss of appetite  History of Present Illness:   55 year old white female with a history of poorly controlled diabetes mellitus, hyperlipidemia, hypertension, obesity, fibromyalgia, and depression. She sent today regarding possible hepatic cirrhosis based on the results of her recent CT scan. She is accompanied by her daughter. She denies a personal or family history of liver disease. No history of hepatitis. She does not use alcohol. Her diabetes has been under suboptimal control. No history of GI bleeding, ascites, or difficulty with mentation. Some trace ankle edema. She was recently seen by Dr. Etter Sjogren for UTI as well as flank pain and hematuria. This led to a noncontrast enhanced CT scan of the abdomen and pelvis on July 06, 2010. The liver was reported to be abnormal with nodularity. I reviewed this scan with the radiologist, Dr. Hardin Negus. As well, she compare this to a scan from April 02, 2006. She states that the scans are the same. It appears the patient has fatty infiltration of liver with some irregularity of the contour. Normal liver size as well as spleen. No evidence of portal hypertension. He earlier scan was contrast-enhanced (despite being reported as noncontrast). The patient is upset regarding possibility of liver disease. She is tearful.. Her other complaint is that of constipation since August. Thanks that this is due to pain medication. She has use MiraLax with suboptimal results. No prior history of GI evaluations or screening colonoscopy.   GI Review of Systems    Reports abdominal pain, bloating, loss of appetite, and  nausea.     Location of  Abdominal pain: RLQ / flank.    Denies acid reflux, belching, chest  pain, dysphagia with liquids, dysphagia with solids, heartburn, vomiting, vomiting blood, weight loss, and  weight gain.      Reports change in bowel habits, constipation, and  liver problems.     Denies anal fissure, black tarry stools, diarrhea, diverticulosis, fecal incontinence, heme positive stool, hemorrhoids, irritable bowel syndrome, jaundice, light color stool, rectal bleeding, and  rectal pain.    Current Medications (verified): 1)  Metformin Hcl 1000 Mg Tabs (Metformin Hcl) .[name] Kitchen.. 1 By Mouth Two Times A Day 2)  Omeprazole 20 Mg Cpdr (Omeprazole) .[name] Kitchen.. 1 By Mouth Once Daily 3)  Humulin 70/30 70-30 % Susp (Insulin Isophane & Regular) .... 60 Units Qam, and 35 Units Qpm 4)  Insulin Syringes .... Two Times A Day Dx 250.00 5)  Vitamin B-12 1000 Mcg Tabs (Cyanocobalamin) .... Take 1 Tab By Mouth Two Times A Day 6)  Calcium 600 600 Mg Tabs (Calcium Carbonate) .... Take 1 Tab By Mouth Once Daily 7)  Lexapro 10 Mg Tabs (Escitalopram Oxalate) .[name] Kitchen.. 1 By Mouth Once Daily 8)  Pravachol 40 Mg Tabs (Pravastatin Sodium) .[name] Kitchen.. 1 By Mouth At Bedtime. 9)  Vicoprofen 7.5-200 Mg Tabs (Hydrocodone-Ibuprofen) .[name] Kitchen.. 1 By Mouth Every 6 Hours As Needed 10)  Multivitamins  Caps (Multiple Vitamin) .... One Tablet By Mouth Once Daily 11)  Neurontin 300 Mg Caps (Gabapentin) .... 2 By Mouth Three Times A Day 12)  Onetouch Ultra Test  Strp (Glucose Blood) .... Two Times A Day 13)  Promethazine Hcl 25 Mg Tabs (Promethazine Hcl) .... Take One Tablet Qid As Needed 14)  Caltrate 600+d 600-400 Mg-Unit Tabs (  Calcium Carbonate-Vitamin D) .... One Tablet By Mouth Two Times A Day 15)  Miralax  Powd (Polyethylene Glycol 3350) .... Once Daily 16)  Stool Softener 100 Mg Caps (Docusate Sodium) .... One Capsule By Mouth Once Daily  Allergies (verified): 1)  ! 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 of HEMOPTYSIS (ICD-786.3) HYPERLIPIDEMIA (ICD-272.4) HYPERTENSION (ICD-401.9) DIABETES MELLITUS, TYPE II (ICD-250.00) ALLERGIC RHINITIS (ICD-477.9) ANXIETY (ICD-300.00) NECK PAIN (ICD-723.1) FAMILY HISTORY OF CAD FEMALE 1ST DEGREE RELATIVE <50 (ICD-V17.3) HEALTH SCREENING (ICD-V70.0) CARDIAC CATHETERIZATION, LEFT, HX OF (ICD-V15.2) CHEST PAIN (ICD-786.50) Depression Fibromyalgia  Past Surgical History: Reviewed history from 12/23/2006 and no changes required. Hysterectomy Oophorectomy Cholecystectomy (1995) Caesarean section x 2  Family History: brother is deceased at age 42.  He had diabetes and renal insufficiency brother had early heart attack at age 66 no history colon or breast cancer father had leukemia, heart disease and diabetes but he live until  he was about 55 years old according with the patient mother has diabetes, hypertension, mental problems. Family History of Irritable Bowel Syndrome:Daughter  Family History of Colitis/Crohn's:Father   Social History: widowed once, and divorced once one child (1980) she also lost a child in the 42s. 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 Daily Caffeine Use: one daily   Review of Systems       The patient complains of allergy/sinus, anxiety-new, back pain, fatigue, fever, itching, muscle pains/cramps, sleeping problems, and swelling of feet/legs.  The patient denies anemia, arthritis/joint pain, blood in urine, breast changes/lumps, change in vision, confusion, cough, coughing up blood, depression-new, fainting,  headaches-new, hearing problems, heart murmur, heart rhythm changes, menstrual pain, night sweats, nosebleeds, pregnancy symptoms, shortness of breath, skin rash, sore throat, swollen lymph glands, thirst - excessive , urination - excessive , urination changes/pain, urine leakage, vision changes, and voice change.    Vital Signs:  Patient profile:   55 year old female Height:      62 inches Weight:      171 pounds BMI:     31.39 BSA:     1.79 Pulse rate:   88 / minute Pulse rhythm:   regular BP sitting:   120 / 76  (left arm) Cuff size:   regular  Vitals Entered By: Hope Pigeon CMA (August 19, 2010 10:26 AM)  Physical Exam  General:  Well developed,obese, well nourished, no acute distress. Head:  Normocephalic and atraumatic. Eyes:  PERRLA, no icterus. Nose:  No deformity, discharge,  or lesions. Mouth:  No deformity or lesions. Tongue has normal hue Neck:  Supple; no masses or thyromegaly. Lungs:  Clear throughout to auscultation. Heart:  Regular rate and rhythm; no murmurs, rubs,  or bruits. Abdomen:  Soft, obese,nontender and nondistended. No masses, hepatosplenomegaly or hernias noted. Normal bowel sounds. Msk:  Symmetrical with no gross deformities. Normal posture. Pulses:  Normal pulses noted. Extremities:  No clubbing, cyanosis, edema or deformities noted. Neurologic:  Alert and  oriented x4;  grossly normal neurologically. No asterixis. Skin:  Intact without significant lesions or rashes. No spider angioma. Psych:  Alert and cooperative. Normal mood and affect.   Impression & Recommendations:  Problem # 1:  NONSPECIFIC  ABN FINDNG RAD&OTH EXAM BILARY TRCT (ICD-15.105) 55 year old woman who is sent today regarding possible hepatic cirrhosis based on a recent noncontrast enhanced CT scan. Outside laboratories with normal liver function tests and normal CBC except for mildly depressed platelets. Imaging study unchanged from 4 years previous. No evidence for portal  hypertension. I suspect this patient has fatty liver disease, and may be at risk for NASH with the progression to advanced liver disease. However, this point she does not appear to have such.  Plan: #1. Additional laboratory studies to assess for impairment of hepatic synthetic function as well as other potential causes for liver problems #2. Review CT scan with radiologist. This was completed up to the patient left the office and is outlined in the history of present illness. #3. Office followup in a few weeks to review review the results  Problem # 2:  CONSTIPATION (ICD-564.00) new onset constipation. Most likely due to medication. No response to once daily MiraLax.  Plan: #1. Titrate MiraLax up until desired result achieved  Problem # 3:  SPECIAL SCREENING FOR MALIGNANT NEOPLASMS COLON (ICD-V76.51) we discussed colon cancer screening briefly. The patient is an appropriate candidate without contraindication. We will discuss this further and arrange screening colonoscopy, if she is interested, at the time of her next visit.  Other Orders: T-Hepatitis B Surface Antibody ([id number]) T-Hepatitis B Surface Antigen [phone]) T-Hepatitis C Anti HCV (73567) T-Alpha-1-Antitrypsin Tot (01410-30131) T-AMA (43888) T-Anti SMA (75797-28206) T-Ceruloplasmin (01561-53794) TLB-Hepatic/Liver Function Pnl (80076-HEPATIC) TLB-PT (Protime) (85610-PTP) TLB-IBC Pnl (Iron/FE;Transferrin) (83550-IBC) TLB-Ferritin (82728-FER)  Patient Instructions: 1)  Labs ordered for patient 2)  Go to basement floor to lab to get drawn today. 3)  Increase Miralax until you are able to have bowel movement once daily 4)  Please schedule a follow-up appointment in 4 to 6 weeks.  5)  Copy sent to : Garnet Koyanagi, DO  6)  The medication list was reviewed and reconciled.  All changed / newly prescribed medications were explained.  A complete medication list was provided to the patient / caregiver.

## 2010-11-29 NOTE — Letter (Signed)
Summary: New Patient letter  [facility]  [address] [place], [place] [address]   Phone: [phone]  Fax: [phone]       03/15/2010 MRN: [id number]  [name] [name] [place], [place]  [address]  Dear [name],  Welcome to the Gastroenterology Division at [facility].    You are scheduled to see Dr.  Owens Loffler on April 19, 2010 at 2:30pm on the 3rd floor at [facility], [place] [facility].  We ask that you try to arrive at our office 15 minutes prior to your appointment time to allow for check-in.  We would like you to complete the enclosed self-administered evaluation form prior to your visit and bring it with you on the day of your appointment.  We will review it with you.  Also, please bring a complete list of all your medications or, if you prefer, bring the medication bottles and we will list them.  Please bring your insurance card so that we may make a copy of it.  If your insurance requires a referral to see a specialist, please bring your referral form from your primary care physician.  Co-payments are due at the time of your visit and may be paid by cash, check or credit card.     Your office visit will consist of a consult with your physician (includes a physical exam), any laboratory testing he/she may order, scheduling of any necessary diagnostic testing (e.g. x-ray, ultrasound, CT-scan), and scheduling of a procedure (e.g. Endoscopy, Colonoscopy) if required.  Please allow enough time on your schedule to allow for any/all of these possibilities.    If you cannot keep your appointment, please call [phone] to cancel or reschedule prior to your appointment date.  This allows [name] the opportunity to schedule an appointment for another patient in need of care.  If you do not cancel or reschedule by 5 p.m. the business day prior to your appointment date, you will be charged a $50.00 late cancellation/no-show fee.    Thank you for  choosing [facility] for your medical needs.  We appreciate the opportunity to care for you.  Please visit [name] at our website  to learn more about our practice.                     Sincerely,                                                             The Gastroenterology Division

## 2010-11-29 NOTE — Letter (Signed)
Summary: Primary Care Appointment Letter  [facility] [place]   [place], [place] [address]   Phone: [phone]  Fax: [phone]    01/09/2008 MRN: [id number]  [place], [place]  [address]  Dear Ms. Mariea Clonts,   Your Primary Care Physician  has indicated that:    ___x____it is time to schedule an appointment.    _______you missed your appointment on______ and need to call and          reschedule.    _______you need to have lab work done.    _______you need to schedule an appointment discuss lab or test results.    _______you need to call to reschedule your appointment that is                       scheduled on _________.     Please call our office as soon as possible. Our phone number is [phone]          _________. Please press option 1. Our office is open 8a-12noon and 1p-5p, Monday through Friday.     Thank you,    Major

## 2010-11-29 NOTE — Letter (Signed)
Summary: New Patient letter  [name]  [address] [place], [place] [address]   Phone: [phone]  Fax: [phone]       07/07/2010 MRN: [id number]  [place] [address] [place], [place]  [address]  Dear Ms. Mariea Clonts,  Welcome to the Gastroenterology Division at [facility].    You are scheduled to see Dr.  Scarlette Shorts on August 19, 2010 at 10:00am on the 3rd floor at [facility], [place] [facility].  We ask that you try to arrive at our office 15 minutes prior to your appointment time to allow for check-in.  We would like you to complete the enclosed self-administered evaluation form prior to your visit and bring it with you on the day of your appointment.  We will review it with you.  Also, please bring a complete list of all your medications or, if you prefer, bring the medication bottles and we will list them.  Please bring your insurance card so that we may make a copy of it.  If your insurance requires a referral to see a specialist, please bring your referral form from your primary care physician.  Co-payments are due at the time of your visit and may be paid by cash, check or credit card.     Your office visit will consist of a consult with your physician (includes a physical exam), any laboratory testing he/she may order, scheduling of any necessary diagnostic testing (e.g. x-ray, ultrasound, CT-scan), and scheduling of a procedure (e.g. Endoscopy, Colonoscopy) if required.  Please allow enough time on your schedule to allow for any/all of these possibilities.    If you cannot keep your appointment, please call [phone] to cancel or reschedule prior to your appointment date.  This allows [name] the opportunity to schedule an appointment for another patient in need of care.  If you do not cancel or reschedule by 5 p.m. the business day prior to your appointment date, you will be charged a $50.00 late cancellation/no-show fee.    Thank you for  choosing [facility] for your medical needs.  We appreciate the opportunity to care for you.  Please visit [name] at our website  to learn more about our practice.                     Sincerely,                                                             The Gastroenterology Division

## 2010-11-29 NOTE — Letter (Signed)
Summary: Primary Care Consult Scheduled Letter  [facility] [place]   [place], [place] [address]   Phone: [phone]  Fax: [phone]      02/24/2010 MRN: [id number]  [place],   [address]    Dear [name],    We have scheduled an appointment for you.  At the recommendation of Dr. Garnet Koyanagi, we have scheduled you a consult with Dr. Elvia Collum of [facility] on 03-17-2010 at 8:30am.  Their address is [address], [place], [place] [address]. The office phone number is [phone].  If this appointment day and time is not convenient for you, please feel free to call the office of the doctor you are being referred to at the number listed above and reschedule the appointment.    It is important for you to keep your scheduled appointments. We are here to make sure you are given good patient care.   Thank you,    Renee, Patient Care Coordinator [facility]

## 2010-11-29 NOTE — Assessment & Plan Note (Signed)
Summary: STOMACH PAIN/DARK URINE/KIDNEY PAIN//KN   Vital Signs:  Patient profile:   55 year old female Height:      62 inches Weight:      170.2 pounds Temp:     98.6 degrees F oral BP sitting:   130 / 78  (left arm)  Vitals Entered By: Aron Baba CMA Deborra Medina) (July 05, 2010 3:55 PM) CC: c/o LBP and Pelvic Pain, Dysuria   History of Present Illness:  Dysuria      This is a 55 year old woman who presents with Dysuria.  The symptoms began 4 days ago.  The patient complains of burning with urination, urinary frequency, and hematuria, but denies urgency, vaginal discharge, vaginal itching, and vaginal sores.  Associated symptoms include flank pain, abdominal pain, and back pain.  The patient denies the following associated symptoms: nausea, vomiting, fever, shaking chills, pelvic pain, and arthralgias.  Risk factors for urinary tract infection include diabetes.  The patient denies the following risk factors: prior antibiotics, immunosuppression, history of GU anomaly, history of pyelonephritis, pregnancy, history of STD, and analgesic abuse.    Current Medications (verified): 1)  Metformin Hcl 1000 Mg Tabs (Metformin Hcl) .[name] Kitchen.. 1 By Mouth Two Times A Day 2)  Xanax 0.5 Mg Tabs (Alprazolam) .[name] Kitchen.. 1 By Mouth Qhs 3)  Aspir-Low 81 Mg  Tbec (Aspirin) 4)  Omeprazole 20 Mg Cpdr (Omeprazole) .[name] Kitchen.. 1 By Mouth Once Daily 5)  Humulin 70/30 70-30 % Susp (Insulin Isophane & Regular) .... 60 Units Qam, and 35 Units Qpm 6)  Insulin Syringes .... Two Times A Day Dx 250.00 7)  Vitamin D3 2000 Unit Caps (Cholecalciferol) .... Take 1 Tab By Mouth Once Daily 8)  Vitamin B-12 1000 Mcg Tabs (Cyanocobalamin) .... Take 1 Tab By Mouth Two Times A Day 9)  Calcium 600 600 Mg Tabs (Calcium Carbonate) .... Take 1 Tab By Mouth Once Daily 10)  Lexapro 10 Mg Tabs (Escitalopram Oxalate) .[name] Kitchen.. 1 By Mouth Once Daily 11)  Pravachol 40 Mg Tabs (Pravastatin Sodium) .[name] Kitchen.. 1 By Mouth At Bedtime. 12)  Norco 10-325 Mg Tabs  (Hydrocodone-Acetaminophen) .[name] Kitchen.. 1 By Mouth Q6 Hours As Needed 13)  Mvi 14)  Neurontin 300 Mg Caps (Gabapentin) .... 2 By Mouth Three Times A Day 15)  Onetouch Ultra Test  Strp (Glucose Blood) .... Two Times A Day 16)  Amitriptyline Hcl 50 Mg Tabs (Amitriptyline Hcl) .[name] Kitchen.. 1 By Mouth At Bedtime 17)  Cipro 500 Mg Tabs (Ciprofloxacin Hcl) .[name] Kitchen.. 1 By Mouth Two Times A Day  Allergies (verified): 1)  ! Pravachol 2)  Penicillin G Potassium (Penicillin G Potassium)  Past History:  Past medical, surgical, family and social histories (including risk factors) reviewed for relevance to current acute and chronic problems.  Past Medical History: Reviewed history from 06/03/2008 and no changes required. Allergic rhinitis Diabetes mellitus, type II Hypertension Anxiety Hyperlipidemia  Past Surgical History: Reviewed history from 12/23/2006 and no changes required. Hysterectomy Oophorectomy Cholecystectomy (1995) Caesarean section x 2  Family History: Reviewed history from 07/03/2007 and no changes required. brother is deceased at age 60.  He had diabetes and renal insufficiency brother had early heart attack at age 64 no history colon or breast cancer father had leukemia, heart disease and diabetes but he live until  he was about 55 years old according with the patient mother has diabetes, hypertension, mental problems.  Social History: Reviewed history from 05/03/2010 and no changes required. widowed once, and divorced once one child (1980) she also lost  a child in the 48s. 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  Physical Exam  General:  Well-developed,well-nourished,in no acute distress; alert,appropriate and cooperative throughout examination Abdomen:  + suprapubic tenderness + R flank pain soft, no masses, no guarding, and no rebound tenderness.   Skin:  Intact without suspicious lesions or rashes Psych:  Oriented X3, normally  interactive, good eye contact, not anxious appearing, and not depressed appearing.     Impression & Recommendations:  Problem # 1:  RENAL COLIC (YTK-354.6) Pt has norco for pain strain urine if pain persists with pain meds---go to ER Orders: Radiology Referral (Radiology) Venipuncture (56812) TLB-BMP (Basic Metabolic Panel-BMET) (75170-[id number]) TLB-CBC Platelet - w/Differential (85025-CBCD) TLB-Hepatic/Liver Function Pnl (80076-HEPATIC) Specimen Handling (99000) UA Dipstick w/o Micro (manual) (81002)  Problem # 2:  DYSURIA (ICD-788.1)  Her updated medication list for this problem includes:    Cipro 500 Mg Tabs (Ciprofloxacin hcl) .[name] Kitchen... 1 by mouth two times a day  Orders: T-Culture, Urine ([id number]) Radiology Referral (Radiology) Venipuncture (66599) TLB-BMP (Basic Metabolic Panel-BMET) ([id number]) TLB-CBC Platelet - w/Differential (85025-CBCD) TLB-Hepatic/Liver Function Pnl (80076-HEPATIC) Specimen Handling (99000) UA Dipstick w/o Micro (manual) (81002)  Encouraged to push clear liquids, get enough rest, and take acetaminophen as needed. To be seen in 10 days if no improvement, sooner if worse.  Complete Medication List: 1)  Metformin Hcl 1000 Mg Tabs (Metformin hcl) .[name] Kitchen.. 1 by mouth two times a day 2)  Xanax 0.5 Mg Tabs (Alprazolam) .[name] Kitchen.. 1 by mouth qhs 3)  Aspir-low 81 Mg Tbec (Aspirin) 4)  Omeprazole 20 Mg Cpdr (Omeprazole) .[name] Kitchen.. 1 by mouth once daily 5)  Humulin 70/30 70-30 % Susp (Insulin isophane & regular) .... 60 units qam, and 35 units qpm 6)  Insulin Syringes  .... Two times a day dx 250.00 7)  Vitamin D3 2000 Unit Caps (Cholecalciferol) .... Take 1 tab by mouth once daily 8)  Vitamin B-12 1000 Mcg Tabs (Cyanocobalamin) .... Take 1 tab by mouth two times a day 9)  Calcium 600 600 Mg Tabs (Calcium carbonate) .... Take 1 tab by mouth once daily 10)  Lexapro 10 Mg Tabs (Escitalopram oxalate) .[name] Kitchen.. 1 by mouth once daily 11)  Pravachol 40 Mg Tabs (Pravastatin  sodium) .[name] Kitchen.. 1 by mouth at bedtime. 12)  Norco 10-325 Mg Tabs (Hydrocodone-acetaminophen) .[name] Kitchen.. 1 by mouth q6 hours as needed 13)  Mvi  14)  Neurontin 300 Mg Caps (Gabapentin) .... 2 by mouth three times a day 15)  Onetouch Ultra Test Strp (Glucose blood) .... Two times a day 16)  Amitriptyline Hcl 50 Mg Tabs (Amitriptyline hcl) .[name] Kitchen.. 1 by mouth at bedtime 17)  Cipro 500 Mg Tabs (Ciprofloxacin hcl) .[name] Kitchen.. 1 by mouth two times a day Prescriptions: CIPRO 500 MG TABS (CIPROFLOXACIN HCL) 1 by mouth two times a day  #10 x 0   Entered and Authorized by:   Garnet Koyanagi DO   Signed by:   Garnet Koyanagi DO on 07/05/2010   Method used:   Electronically to        [facility] (retail)       [phone]       [place],   [address]       Ph: [phone]       Fax: [phone]   RxID:   [id number]   Laboratory Results   Urine Tests    Routine Urinalysis   Color: yellow Appearance: Hazy Glucose: >=1000   (  Normal Range: Negative) Bilirubin: small   (Normal Range: Negative) Ketone: negative   (Normal Range: Negative) Spec. Gravity: 1.025   (Normal Range: 1.003-1.035) Blood: negative   (Normal Range: Negative) pH: 5.0   (Normal Range: 5.0-8.0) Protein: trace   (Normal Range: Negative) Urobilinogen: 0.2   (Normal Range: 0-1) Nitrite: negative   (Normal Range: Negative) Leukocyte Esterace: negative   (Normal Range: Negative)

## 2010-11-29 NOTE — Assessment & Plan Note (Signed)
Summary: FLU SHOT//KN  Nurse Visit   Allergies: 1)  ! Pravachol 2)  Penicillin G Potassium (Penicillin G Potassium)  Orders Added: 1)  Admin 1st Vaccine [90471] 2)  Flu Vaccine 48yr + [[33917]Flu Vaccine Consent Questions     Do you have a history of severe allergic reactions to this vaccine? no    Any prior history of allergic reactions to egg and/or gelatin? no    Do you have a sensitivity to the preservative Thimersol? no    Do you have a past history of Guillan-Barre Syndrome? no    Do you currently have an acute febrile illness? no    Have you ever had a severe reaction to latex? no    Vaccine information given and explained to patient? yes    Are you currently pregnant? no    Lot Number:[id number]   Exp Date:04/29/2011   Site Given  Left Deltoid IM .lbflu

## 2010-11-29 NOTE — Assessment & Plan Note (Signed)
Summary: new,elev sugar/lowne/cd   Vital Signs:  Patient profile:   55 year old female Height:      62 inches (157.48 cm) Weight:      170.38 pounds (77.45 kg) BMI:     31.28 O2 Sat:      97 % on Room air Temp:     98.0 degrees F (36.67 degrees C) oral Pulse rate:   86 / minute Pulse rhythm:   regular BP sitting:   112 / 66  (left arm) Cuff size:   regular  Vitals Entered By: Rebeca Alert MA (May 03, 2010 2:10 PM)  O2 Flow:  Room air CC: New Endo/elevated bg Dr. Vickey Huger Comments Pt no longer taking xanax, naproxyn, lexapro or pravachol/aj   Referring Provider:  lowne Primary Provider:  Etter Sjogren  CC:  New Endo/elevated bg Dr. Vickey Huger.  History of Present Illness: pt states 8 years h/o dm.  it is complicated by painful peripheral neuropathy.  he has been on insulin x 6 years.  she takes humulin 70/30, 45 units am and 35 units pm.  no cbg record, but states cbg's vary from 200-475.  she says it is lowest in am, and highest in the afternoon. pt says his diet and exercise are "good."   symptomatically, pt states 8 mos of severe pain at the legs hands, and feet, and associated numbness.  she ran out of neurontin.   Current Medications (verified): 1)  Metformin Hcl 1000 Mg Tabs (Metformin Hcl) .[name] Kitchen.. 1 By Mouth Two Times A Day 2)  Xanax 0.5 Mg Tabs (Alprazolam) .[name] Kitchen.. 1 By Mouth Qhs 3)  Aspir-Low 81 Mg  Tbec (Aspirin) 4)  Omeprazole 20 Mg Cpdr (Omeprazole) .[name] Kitchen.. 1 By Mouth Once Daily 5)  Humulin 70/30 70-30 % Susp (Insulin Isophane & Regular) .... 45 Units Qam, and 35 Units Qpm 6)  Insulin Syringes .... Two Times A Day Dx 250.00 7)  Vitamin D3 2000 Unit Caps (Cholecalciferol) .... Take 1 Tab By Mouth Once Daily 8)  Vitamin B-12 1000 Mcg Tabs (Cyanocobalamin) .... Take 1 Tab By Mouth Two Times A Day 9)  Calcium 600 600 Mg Tabs (Calcium Carbonate) .... Take 1 Tab By Mouth Once Daily 10)  Naprosyn 500 Mg Tabs (Naproxen) .[name] Kitchen.. 1 Two Times A Day As Needed For Pain and Inflammation.  Take  W/ Food To Avoid Upset Stomach. 11)  Lexapro 10 Mg Tabs (Escitalopram Oxalate) .[name] Kitchen.. 1 By Mouth Once Daily 12)  Pravachol 40 Mg Tabs (Pravastatin Sodium) .[name] Kitchen.. 1 By Mouth At Bedtime. 13)  Vicodin 5-500 Mg Tabs (Hydrocodone-Acetaminophen) .[name] Kitchen.. 1 By Mouth Every 6 Hours As Needed 14)  Mvi 15)  Neurontin 300 Mg Caps (Gabapentin) .[name] Kitchen.. 1 By Mouth Three Times A Day 16)  Onetouch Ultra Test  Strp (Glucose Blood) .... Two Times A Day  Allergies (verified): 1)  ! Pravachol 2)  Penicillin G Potassium (Penicillin G Potassium)  Past History:  Past Medical History: Last updated: 06/03/2008 Allergic rhinitis Diabetes mellitus, type II Hypertension Anxiety Hyperlipidemia  Family History: Reviewed history from 07/03/2007 and no changes required. brother is deceased at age 19.  He had diabetes and renal insufficiency brother had early heart attack at age 38 no history colon or breast cancer father had leukemia, heart disease and diabetes but he live until  he was about 55 years old according with the patient mother has diabetes, hypertension, mental problems.  Social History: Reviewed history from 02/19/2009 and no changes required. widowed once, and divorced once one child (  1980) she also lost a child in the 83s. 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  Review of Systems       The patient complains of headaches.         denies blurry vision, sob, urinary frequency, excessive diaphoresis, memory loss, depression, hypoglycemia, and easy bruising.  she says her weight loss has leveled off.  she has chest pain (cath dx'ed noncardiogenic pain).  she has fatigue and muscle cramps.  she has n/v, but that is better recently.  she has rhinorrhea.  Physical Exam  General:  obese.  no distress  Head:  head: no deformity eyes: no periorbital swelling, no proptosis external nose and ears are normal mouth: no lesion seen Neck:  Supple without thyroid  enlargement or tenderness.  Lungs:  Clear to auscultation bilaterally. Normal respiratory effort.  Heart:  Regular rate and rhythm without murmurs or gallops noted. Normal S1,S2.   Msk:  muscle bulk and strength are grossly normal.  no obvious joint swelling.  gait is normal and steady  Pulses:  dorsalis pedis intact bilat.  no carotid bruit  Extremities:  no deformity.  no ulcer on the feet.  feet are of normal color and temp.  no edema  Neurologic:  cn 2-12 grossly intact.   readily moves all 4's.   sensation is intact to touch on the feet  Skin:  normal texture and temp.  no rash.  not diaphoretic  Cervical Nodes:  No significant adenopathy.  Psych:  Alert and cooperative; normal mood and affect; normal attention span and concentration.     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 60 units qam, and 35 units qpm  Other Orders: Consultation Level III (26712)  Patient Instructions: 1)  good diet and exercise habits significanly improve the control of your diabetes.  please let me know if you wish to be referred to a dietician.  high blood sugar is very risky to your health.  you should see an eye doctor every year. 2)  controlling your blood pressure and cholesterol drastically reduces the damage diabetes does to your body.  this also applies to quitting smoking.  please discuss these with your doctor.  you should take an aspirin every day, unless you have been advised by a doctor not to. 3)  we will need to take  this complex situation in stages. 4)  check your blood sugar 1 time a day.  vary the time of day when you check, between before the 3 meals, and at bedtime.  also check if you have symptoms of your blood sugar being too high or too low.  please keep a record of the readings and bring it to your next appointment here.  please call us sooner if you are having low blood sugar episodes.   5)  you should resume the neurontin.  6)  for now, increase your insulin to 60 units am and 35 units pm. 7)  Please schedule a follow-up appointment in 1 month. Prescriptions: NEURONTIN 300  MG CAPS (GABAPENTIN) 1 by mouth three times a day  #90 x 11   Entered and Authorized by:   Donavan Foil MD   Signed by:   Donavan Foil MD on 05/03/2010   Method used:   Electronically to        [facility] (retail)       [phone]       [place], [place]  [address]       Ph: [phone]       Fax: [phone]   RxID:   [id number]

## 2010-11-29 NOTE — Assessment & Plan Note (Signed)
Summary: back pain/burning when urinating/cbs   Vital Signs:  Patient profile:   55 year old female Weight:      167.6 pounds Temp:     98.2 degrees F Pulse rate:   84 / minute Pulse rhythm:   regular BP sitting:   138 / 74  (right arm) Cuff size:   regular  Vitals Entered By: Aron Baba CMA ([place]) (August 05, 2010 11:00 AM) CC: c/o pelvic pain and lower back pain and burnig while urinating, Dysuria   History of Present Illness:  Dysuria      This is a 55 year old woman who presents with Dysuria.  The symptoms began 2 weeks ago.  The patient complains of burning with urination and urinary frequency, but denies urgency, hematuria, vaginal discharge, vaginal itching, and vaginal sores.  Associated symptoms include abdominal pain.  The patient denies the following associated symptoms: nausea, vomiting, fever, shaking chills, flank pain, back pain, pelvic pain, and arthralgias.  Risk factors for urinary tract infection include diabetes and prior antibiotics.  History is significant for recent UTI.    Current Medications (verified): 1)  Metformin Hcl 1000 Mg Tabs (Metformin Hcl) .[name] Kitchen.. 1 By Mouth Two Times A Day 2)  Xanax 0.5 Mg Tabs (Alprazolam) .[name] Kitchen.. 1 By Mouth Qhs 3)  Aspir-Low 81 Mg  Tbec (Aspirin) 4)  Omeprazole 20 Mg Cpdr (Omeprazole) .[name] Kitchen.. 1 By Mouth Once Daily 5)  Humulin 70/30 70-30 % Susp (Insulin Isophane & Regular) .... 60 Units Qam, and 35 Units Qpm 6)  Insulin Syringes .... Two Times A Day Dx 250.00 7)  Vitamin D3 2000 Unit Caps (Cholecalciferol) .... Take 1 Tab By Mouth Once Daily 8)  Vitamin B-12 1000 Mcg Tabs (Cyanocobalamin) .... Take 1 Tab By Mouth Two Times A Day 9)  Calcium 600 600 Mg Tabs (Calcium Carbonate) .... Take 1 Tab By Mouth Once Daily 10)  Lexapro 10 Mg Tabs (Escitalopram Oxalate) .[name] Kitchen.. 1 By Mouth Once Daily 11)  Pravachol 40 Mg Tabs (Pravastatin Sodium) .[name] Kitchen.. 1 By Mouth At Bedtime. 12)  Vicoprofen 7.5-200 Mg Tabs (Hydrocodone-Ibuprofen) .[name] Kitchen.. 1 By Mouth  Every 6 Hours As Needed 13)  Mvi 14)  Neurontin 300 Mg Caps (Gabapentin) .... 2 By Mouth Three Times A Day 15)  Onetouch Ultra Test  Strp (Glucose Blood) .... Two Times A Day 16)  Amitriptyline Hcl 50 Mg Tabs (Amitriptyline Hcl) .[name] Kitchen.. 1 By Mouth At Bedtime 17)  Promethazine Hcl 25 Mg Tabs (Promethazine Hcl) .... Take One Tablet Qid As Needed  Allergies (verified): 1)  ! Pravachol 2)  Penicillin G Potassium (Penicillin G Potassium)  Past History:  Past medical, surgical, family and social histories (including risk factors) reviewed for relevance to current acute and chronic problems.  Past Medical History: Reviewed history from 06/03/2008 and no changes required. Allergic rhinitis Diabetes mellitus, type II Hypertension Anxiety Hyperlipidemia  Past Surgical History: Reviewed history from 12/23/2006 and no changes required. Hysterectomy Oophorectomy Cholecystectomy (1995) Caesarean section x 2  Family History: Reviewed history from 07/03/2007 and no changes required. brother is deceased at age 51.  He had diabetes and renal insufficiency brother had early heart attack at age 63 no history colon or breast cancer father had leukemia, heart disease and diabetes but he live until  he was about 55 years old according with the patient mother has diabetes, hypertension, mental problems.  Social History: Reviewed history from 05/03/2010 and no changes required. widowed once, and divorced once one child (1980) she also lost a  child in the 72s. 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  Review of Systems      See HPI  Physical Exam  General:  Well-developed,well-nourished,in no acute distress; alert,appropriate and cooperative throughout examination Abdomen:  soft.  + suprapubic pain no flank pain Neurologic:  No cranial nerve deficits noted. Station and gait are normal. Plantar reflexes are down-going bilaterally. DTRs are symmetrical  throughout. Sensory, motor and coordinative functions appear intact. Skin:  Intact without suspicious lesions or rashes Psych:  Cognition and judgment appear intact. Alert and cooperative with normal attention span and concentration. No apparent delusions, illusions, hallucinations   Impression & Recommendations:  Problem # 1:  UTI (ICD-599.0) Assessment New  Her updated medication list for this problem includes:    Macrobid 100 Mg Caps (Nitrofurantoin monohyd macro) .[name] Kitchen... 1 by mouth two times a day  Encouraged to push clear liquids, get enough rest, and take acetaminophen as needed. To be seen in 10 days if no improvement, sooner if worse.  Orders: UA Dipstick w/o Micro (manual) (81002) T-Culture, Urine (21194-17408)  Complete Medication List: 1)  Metformin Hcl 1000 Mg Tabs (Metformin hcl) .[name] Kitchen.. 1 by mouth two times a day 2)  Xanax 0.5 Mg Tabs (Alprazolam) .[name] Kitchen.. 1 by mouth qhs 3)  Aspir-low 81 Mg Tbec (Aspirin) 4)  Omeprazole 20 Mg Cpdr (Omeprazole) .[name] Kitchen.. 1 by mouth once daily 5)  Humulin 70/30 70-30 % Susp (Insulin isophane & regular) .... 60 units qam, and 35 units qpm 6)  Insulin Syringes  .... Two times a day dx 250.00 7)  Vitamin D3 2000 Unit Caps (Cholecalciferol) .... Take 1 tab by mouth once daily 8)  Vitamin B-12 1000 Mcg Tabs (Cyanocobalamin) .... Take 1 tab by mouth two times a day 9)  Calcium 600 600 Mg Tabs (Calcium carbonate) .... Take 1 tab by mouth once daily 10)  Lexapro 10 Mg Tabs (Escitalopram oxalate) .[name] Kitchen.. 1 by mouth once daily 11)  Pravachol 40 Mg Tabs (Pravastatin sodium) .[name] Kitchen.. 1 by mouth at bedtime. 12)  Vicoprofen 7.5-200 Mg Tabs (Hydrocodone-ibuprofen) .[name] Kitchen.. 1 by mouth every 6 hours as needed 13)  Mvi  14)  Neurontin 300 Mg Caps (Gabapentin) .... 2 by mouth three times a day 15)  Onetouch Ultra Test Strp (Glucose blood) .... Two times a day 16)  Amitriptyline Hcl 50 Mg Tabs (Amitriptyline hcl) .[name] Kitchen.. 1 by mouth at bedtime 17)  Promethazine Hcl 25 Mg Tabs  (Promethazine hcl) .... Take one tablet qid as needed 18)  Macrobid 100 Mg Caps (Nitrofurantoin monohyd macro) .[name] Kitchen.. 1 by mouth two times a day  Patient Instructions: 1)  Drink plenty of fluids up to 3-4 quarts a day. Cranberry juice is especially recommended in addition to large amounts of water. Avoid caffeine & carbonated drinks, they tend to irritate the bladder, Return in 3-5 days if you're not better: sooner if you're feeling worse.  2)  Please schedule a follow-up appointment in 2 weeks--- to recheck urine only Prescriptions: MACROBID 100 MG CAPS (NITROFURANTOIN MONOHYD MACRO) 1 by mouth two times a day  #14 x 0   Entered and Authorized by:   Garnet Koyanagi DO   Signed by:   Garnet Koyanagi DO on 08/05/2010   Method used:   Electronically to        [facility] (retail)       [phone]       [place], [place]  [address]       Ph: [phone]       Fax: [phone]   RxID:   [id number]   Laboratory Results   Urine Tests    Routine Urinalysis   Color: lt. yellow Appearance: Cloudy Glucose: negative   (Normal Range: Negative) Bilirubin: negative   (Normal Range: Negative) Ketone: negative   (Normal Range: Negative) Spec. Gravity: 1.020   (Normal Range: 1.003-1.035) Blood: moderate   (Normal Range: Negative) pH: 5.0   (Normal Range: 5.0-8.0) Protein: negative   (Normal Range: Negative) Urobilinogen: 0.2   (Normal Range: 0-1) Nitrite: positive   (Normal Range: Negative) Leukocyte Esterace: large   (Normal Range: Negative)

## 2010-11-29 NOTE — Consult Note (Signed)
Summary: [facility]  [facility]   Imported By: Edmonia James 03/24/2010 11:59:21  _____________________________________________________________________  External Attachment:    Type:   Image     Comment:   External Document

## 2010-11-29 NOTE — Progress Notes (Signed)
Summary: REFILL  Phone Note Refill Request Message from:  Fax from Pharmacy on May 20, 2010 9:20 AM  Refills Requested: Medication #1:  VICODIN 5-500MG TAKE 1 TAB BY MOUTH EVERY 6 HOURS AS NEEDED Joella Prince  Initial call taken by: Arbie Coo[place],  May 20, 2010 9:22 AM  Follow-up for Phone Call         pharmacy inform med change to [facility] 10-325 MG TABS (HYDROCODONE-ACETAMINOPHEN) on 05-19-10...[name] KitchenMarland KitchenFelecia Deloach CMA  May 20, 2010 2:35 PM

## 2010-11-29 NOTE — Assessment & Plan Note (Signed)
Summary: BOTH LEGS PAIN//PH   Vital Signs:  Patient profile:   55 year old female Weight:      170.2 pounds Temp:     98.3 degrees F oral Pulse rate:   88 / minute Pulse rhythm:   regular BP sitting:   116 / 70  (left arm) Cuff size:   regular  Vitals Entered By: Aron Baba CMA Deborra Medina) (August 18, 2010 10:54 AM) CC: c/o pain all over the body that is getting worse and bowel problems   History of Present Illness: Pt here c/o pain all over---pt went to ER--see ER report Pt c/o pain in arms, chest (although not today)  ,  legs, hips and ankles.  Pt states pain is worse at night.  Pt was given Mg 15 years ago when she had similar symptoms.   Current Medications (verified): 1)  Metformin Hcl 1000 Mg Tabs (Metformin Hcl) .[name] Kitchen.. 1 By Mouth Two Times A Day 2)  Xanax 0.5 Mg Tabs (Alprazolam) .[name] Kitchen.. 1 By Mouth Qhs 3)  Aspir-Low 81 Mg  Tbec (Aspirin) 4)  Omeprazole 20 Mg Cpdr (Omeprazole) .[name] Kitchen.. 1 By Mouth Once Daily 5)  Humulin 70/30 70-30 % Susp (Insulin Isophane & Regular) .... 60 Units Qam, and 35 Units Qpm 6)  Insulin Syringes .... Two Times A Day Dx 250.00 7)  Vitamin D3 2000 Unit Caps (Cholecalciferol) .... Take 1 Tab By Mouth Once Daily 8)  Vitamin B-12 1000 Mcg Tabs (Cyanocobalamin) .... Take 1 Tab By Mouth Two Times A Day 9)  Calcium 600 600 Mg Tabs (Calcium Carbonate) .... Take 1 Tab By Mouth Once Daily 10)  Lexapro 10 Mg Tabs (Escitalopram Oxalate) .[name] Kitchen.. 1 By Mouth Once Daily 11)  Pravachol 40 Mg Tabs (Pravastatin Sodium) .[name] Kitchen.. 1 By Mouth At Bedtime. 12)  Vicoprofen 7.5-200 Mg Tabs (Hydrocodone-Ibuprofen) .[name] Kitchen.. 1 By Mouth Every 6 Hours As Needed 13)  Mvi 14)  Neurontin 300 Mg Caps (Gabapentin) .... 2 By Mouth Three Times A Day 15)  Onetouch Ultra Test  Strp (Glucose Blood) .... Two Times A Day 16)  Amitriptyline Hcl 50 Mg Tabs (Amitriptyline Hcl) .[name] Kitchen.. 1 By Mouth At Bedtime 17)  Promethazine Hcl 25 Mg Tabs (Promethazine Hcl) .... Take One Tablet Qid As Needed 18)  Oxycodone Hcl 5 Mg  Tabs (Oxycodone Hcl) .[name] Kitchen.. 1-2 Every 6 Hours As Needed Pain  Allergies (verified): 1)  ! Pravachol 2)  Penicillin G Potassium (Penicillin G Potassium)  Past History:  Past medical, surgical, family and social histories (including risk factors) reviewed for relevance to current acute and chronic problems.  Past Medical History: Reviewed history from 06/03/2008 and no changes required. Allergic rhinitis Diabetes mellitus, type II Hypertension Anxiety Hyperlipidemia  Past Surgical History: Reviewed history from 12/23/2006 and no changes required. Hysterectomy Oophorectomy Cholecystectomy (1995) Caesarean section x 2  Family History: Reviewed history from 07/03/2007 and no changes required. brother is deceased at age 5.  He had diabetes and renal insufficiency brother had early heart attack at age 79 no history colon or breast cancer father had leukemia, heart disease and diabetes but he live until  he was about 55 years old according with the patient mother has diabetes, hypertension, mental problems.  Social History: Reviewed history from 05/03/2010 and no changes required. widowed once, and divorced once one child (1980) she also lost a child in the 21s. 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  Review of Systems      See HPI  Physical Exam  General:  Well-developed,well-nourished,in no acute distress; alert,appropriate and cooperative throughout examination Lungs:  Normal respiratory effort, chest expands symmetrically. Lungs are clear to auscultation, no crackles or wheezes. Heart:  Normal rate and regular rhythm. S1 and S2 normal without gallop, murmur, click, rub or other extra sounds. Msk:  No deformity or scoliosis noted of thoracic or lumbar spine.   Extremities:  No clubbing, cyanosis, edema, or deformity noted with normal full range of motion of all joints.   Skin:  Intact without suspicious lesions or rashes Cervical  Nodes:  No lymphadenopathy noted Psych:  Cognition and judgment appear intact. Alert and cooperative with normal attention span and concentration. No apparent delusions, illusions, hallucinations   Impression & Recommendations:  Problem # 1:  MYALGIA (ICD-729.1)  Her updated medication list for this problem includes:    Aspir-low 81 Mg Tbec (Aspirin)    Vicoprofen 7.5-200 Mg Tabs (Hydrocodone-ibuprofen) .[name] Kitchen... 1 by mouth every 6 hours as needed    Oxycodone Hcl 5 Mg Tabs (Oxycodone hcl) .[name] Kitchen... 1-2 every 6 hours as needed pain  Orders: Venipuncture (60630) TLB-BMP (Basic Metabolic Panel-BMET) (16010-XNATFTD) TLB-CBC Platelet - w/Differential (85025-CBCD) TLB-TSH (Thyroid Stimulating Hormone) (84443-TSH) TLB-Phosphorus (84100-PHOS) TLB-Magnesium (Mg) (83735-MG) T-Rheumatoid Factor (32202-54270) T-Vitamin D (25-Hydroxy) (62376-28315) TLB-Sedimentation Rate (ESR) (85652-ESR) T-Antinuclear Antib (ANA) (17616-07371) EKG w/ Interpretation (93000)  Problem # 2:  CHEST PAIN UNSPECIFIED (ICD-786.50)  Orders: EKG w/ Interpretation (93000)  Complete Medication List: 1)  Metformin Hcl 1000 Mg Tabs (Metformin hcl) .[name] Kitchen.. 1 by mouth two times a day 2)  Xanax 0.5 Mg Tabs (Alprazolam) .[name] Kitchen.. 1 by mouth qhs 3)  Aspir-low 81 Mg Tbec (Aspirin) 4)  Omeprazole 20 Mg Cpdr (Omeprazole) .[name] Kitchen.. 1 by mouth once daily 5)  Humulin 70/30 70-30 % Susp (Insulin isophane & regular) .... 60 units qam, and 35 units qpm 6)  Insulin Syringes  .... Two times a day dx 250.00 7)  Vitamin D3 2000 Unit Caps (Cholecalciferol) .... Take 1 tab by mouth once daily 8)  Vitamin B-12 1000 Mcg Tabs (Cyanocobalamin) .... Take 1 tab by mouth two times a day 9)  Calcium 600 600 Mg Tabs (Calcium carbonate) .... Take 1 tab by mouth once daily 10)  Lexapro 10 Mg Tabs (Escitalopram oxalate) .[name] Kitchen.. 1 by mouth once daily 11)  Pravachol 40 Mg Tabs (Pravastatin sodium) .[name] Kitchen.. 1 by mouth at bedtime. 12)  Vicoprofen 7.5-200 Mg Tabs  (Hydrocodone-ibuprofen) .[name] Kitchen.. 1 by mouth every 6 hours as needed 13)  Mvi  14)  Neurontin 300 Mg Caps (Gabapentin) .... 2 by mouth three times a day 15)  Onetouch Ultra Test Strp (Glucose blood) .... Two times a day 16)  Amitriptyline Hcl 50 Mg Tabs (Amitriptyline hcl) .[name] Kitchen.. 1 by mouth at bedtime 17)  Promethazine Hcl 25 Mg Tabs (Promethazine hcl) .... Take one tablet qid as needed 18)  Oxycodone Hcl 5 Mg Tabs (Oxycodone hcl) .[name] Kitchen.. 1-2 every 6 hours as needed pain Prescriptions: VICOPROFEN 7.5-200 MG TABS (HYDROCODONE-IBUPROFEN) 1 by mouth every 6 hours as needed  #30 x 0   Entered and Authorized by:   Garnet Koyanagi DO   Signed by:   Garnet Koyanagi DO on 08/18/2010   Method used:   Print then Give to Patient   RxID:   [id number]    Orders Added: 1)  Venipuncture [09381] 2)  TLB-BMP (Basic Metabolic Panel-BMET) [82993-ZJIRCVE] 3)  TLB-CBC Platelet - w/Differential [85025-CBCD] 4)  TLB-TSH (Thyroid Stimulating Hormone) [84443-TSH] 5)  TLB-Phosphorus [84100-PHOS] 6)  TLB-Magnesium (Mg) [83735-MG] 7)  T-Rheumatoid Factor [  06269-48546] 8)  T-Vitamin D (25-Hydroxy) [phone] 9)  TLB-Sedimentation Rate (ESR) [85652-ESR] 10)  T-Antinuclear Antib (ANA) [18299-37169] 11)  Est. Patient Level IV [67893] 12)  EKG w/ Interpretation [93000]

## 2010-12-01 NOTE — Progress Notes (Signed)
Summary: CX Procedure  Phone Note Call from Patient Call back at Home Phone [phone]   Caller: Patient Call For: Dr Henrene Pastor Reason for Call: Talk to Doctor Details for Reason: CX Procedure Summary of Call: Pt called to cancel colonoscopy for 11/01/10; She went to her PCP today, she has a respiratory infection and a fever of 101. Patient stated she would callback to reschedule.  Do you want to charge cancellation fee? Initial call taken by: Cora Daniels,  October 28, 2010 1:38 PM  Follow-up for Phone Call        I didn't see a note from her PCP in EMR. Who did she see? Follow-up by: Irene Shipper MD,  October 28, 2010 1:47 PM  Additional Follow-up for Phone Call Additional follow up Details #1::        Called patient to confirm, She stated she saw Dr. Etter Sjogren today.  Why not in EMR then. Irene Shipper MD  October 28, 2010 2:29 PM  Additional Follow-up by: Cora Daniels,  October 28, 2010 2:02 PM    Additional Follow-up for Phone Call Additional follow up Details #2::    Pt. contacted and she said she went to [place] office and talked to her but was not charged an o.v.  and she recommended cancelling because of fever.Pt. was not aware the office is closed on Monday. Follow-up by: Abel Presto RN,  October 28, 2010 4:05 PM  Additional Follow-up for Phone Call Additional follow up Details #3:: Details for Additional Follow-up Action Taken: ok. If she's feeling better by monday, she can go ahead and prep for tuesday procecdure. otherwise ok to cancel. Additional Follow-up by: Irene Shipper MD,  October 28, 2010 4:36 PM  Message left for pt. with Dr.Perry's recommendations.

## 2010-12-01 NOTE — Assessment & Plan Note (Signed)
Summary: Followup (question cirrhosis, constipation)   History of Present Illness Visit Type: Follow-up Visit Primary GI MD: Scarlette Shorts MD Primary Iyani Dresner: Garnet Koyanagi, DO  Requesting Kashonda Sarkisyan: Garnet Koyanagi, DO  Chief Complaint: cirrhosis, abdominal pain, nausea, not sleeping  History of Present Illness:   55 year old female with hypertension, hyperlipidemia, obesity, poorly controlled diabetes mellitus, fibromyalgia, and depression. She was evaluated August 19, 2010 regarding possible hepatic cirrhosis based on CT imaging for other reasons. Other issues include constipation.. See that dictation. Previous review of CT scans as outlined with accompanying impression. Testing for a myriad of conditions affecting the liver all returned negative. No evidence of hepatic synthetic dysfunction. Normal liver function tests. We have reviewed these results. Terms of constipation. She has increased MiraLax. This helps. Complains of abdominal discomfort with constipation.   GI Review of Systems    Reports abdominal pain, bloating, and  nausea.     Location of  Abdominal pain: right side.    Denies acid reflux, belching, chest pain, dysphagia with liquids, dysphagia with solids, heartburn, loss of appetite, vomiting, vomiting blood, weight loss, and  weight gain.      Reports constipation, light color stool, and  liver problems.     Denies anal fissure, black tarry stools, change in bowel habit, diarrhea, diverticulosis, fecal incontinence, heme positive stool, hemorrhoids, irritable bowel syndrome, jaundice, rectal bleeding, and  rectal pain.    Current Medications (verified): 1)  Metformin Hcl 1000 Mg Tabs (Metformin Hcl) .[name] Kitchen.. 1 By Mouth Two Times A Day 2)  Omeprazole 20 Mg Cpdr (Omeprazole) .[name] Kitchen.. 1 By Mouth Once Daily 3)  Humulin 70/30 70-30 % Susp (Insulin Isophane & Regular) .... 60 Units Qam, and 35 Units Qpm 4)  Insulin Syringes .... Two Times A Day Dx 250.00 5)  Vitamin B-12 1000 Mcg Tabs  (Cyanocobalamin) .... Take 1 Tab By Mouth Two Times A Day 6)  Calcium 600 600 Mg Tabs (Calcium Carbonate) .... Take 1 Tab By Mouth Once Daily 7)  Lexapro 10 Mg Tabs (Escitalopram Oxalate) .[name] Kitchen.. 1 By Mouth Once Daily 8)  Pravachol 40 Mg Tabs (Pravastatin Sodium) .[name] Kitchen.. 1 By Mouth At Bedtime. 9)  Vicoprofen 7.5-200 Mg Tabs (Hydrocodone-Ibuprofen) .[name] Kitchen.. 1 By Mouth Every 6 Hours As Needed 10)  Multivitamins  Caps (Multiple Vitamin) .... One Tablet By Mouth Once Daily 11)  Neurontin 300 Mg Caps (Gabapentin) .... 2 By Mouth Three Times A Day 12)  Onetouch Ultra Test  Strp (Glucose Blood) .... Two Times A Day 13)  Promethazine Hcl 25 Mg Tabs (Promethazine Hcl) .... Take One Tablet Qid As Needed 14)  Caltrate 600+d 600-400 Mg-Unit Tabs (Calcium Carbonate-Vitamin D) .... One Tablet By Mouth Two Times A Day 15)  Miralax  Powd (Polyethylene Glycol 3350) .... Once Daily 16)  Stool Softener 100 Mg Caps (Docusate Sodium) .... One Capsule By Mouth Once Daily 17)  Cymbalta 30 Mg Cpep (Duloxetine Hcl) .[name] Kitchen.. 1 By Mouth Once Daily X7days and Then 24m By Mouth Once Daily  Allergies: 1)  Penicillin G Potassium (Penicillin G Potassium)  Past History:  Past Medical History: Reviewed history from 08/19/2010 and no changes required. 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 of HEMOPTYSIS (ICD-786.3) HYPERLIPIDEMIA (ICD-272.4) HYPERTENSION (ICD-401.9) DIABETES MELLITUS, TYPE II (ICD-250.00) ALLERGIC RHINITIS (ICD-477.9) ANXIETY (ICD-300.00) NECK  PAIN (ICD-723.1) FAMILY HISTORY OF CAD FEMALE 1ST DEGREE RELATIVE <50  (ICD-V17.3) HEALTH SCREENING (ICD-V70.0) CARDIAC CATHETERIZATION, LEFT, HX OF (ICD-V15.2) CHEST PAIN (ICD-786.50) Depression Fibromyalgia  Past Surgical History: Reviewed history from 12/23/2006 and no changes required. Hysterectomy Oophorectomy Cholecystectomy (1995) Caesarean section x 2  Family History: Reviewed history from 08/19/2010 and no changes required. brother is deceased at age 42.  He had diabetes and renal insufficiency brother had early heart attack at age 35 no history colon or breast cancer father had leukemia, heart disease and diabetes but he live until  he was about 55 years old according with the patient mother has diabetes, hypertension, mental problems. Family History of Irritable Bowel Syndrome:Daughter  Family History of Colitis/Crohn's:Father   Social History: Reviewed history from 08/19/2010 and no changes required. widowed once, and divorced once one child (1980) she also lost a child in the 34s. 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 Daily Caffeine Use: one daily   Review of Systems       The patient complains of arthritis/joint pain, fatigue, headaches-new, muscle pains/cramps, sleeping problems, and swelling of feet/legs.  The patient denies allergy/sinus, anemia, anxiety-new, back pain, blood in urine, breast changes/lumps, change in vision, confusion, cough, coughing up blood, depression-new, fainting, fever, hearing problems, heart murmur, heart rhythm changes, itching, menstrual pain, night sweats, nosebleeds, pregnancy symptoms, shortness of breath, skin rash, sore throat, swollen lymph glands, thirst - excessive , urination - excessive , urination changes/pain, urine leakage, vision changes, and voice change.    Vital Signs:  Patient profile:   55 year old female Height:      62 inches Weight:      168.13 pounds BMI:     30.86 Pulse rate:   84 / minute Pulse rhythm:   regular BP sitting:   126 /  74  (left arm) Cuff size:   regular  Vitals Entered By: June McMurray [place] Deborra Medina) (September 19, 2010 8:31 AM)  Physical Exam  General:  Well developed, well nourished, no acute distress. Eyes:  anicteric Abdomen:  not examined Rectal:  deferred until colonoscopy Pulses:  Normal pulses noted. Neurologic:  alert and oriented Psych:  Alert and cooperative. Normal mood and affect.   Impression & Recommendations:  Problem # 1:  NONSPECIFIC ABN FINDNG RAD&OTH EXAM BILARY TRCT (ICD-793.3) changes on hepatic imaging most consistent with fatty liver. There is no evidence for hepatic synthetic dysfunction, portal hypertension, or obvious cirrhosis. She is at risk for fatty liver disease, including NASH with its potential sequelae of cirrhosis. We discussed this in detail today. We discussed that it would be important for her to lose weight and better control her diabetes and lipids. She understands these issues.  Problem # 2:  CONSTIPATION (ICD-564.00) better with titrated dose of MiraLax. Continue as needed.  Problem # 3:  SPECIAL SCREENING FOR MALIGNANT NEOPLASMS COLON (ICD-V76.51) we discussed screening colonoscopy in detail today. She is an appropriate candidate without contraindication and is interested. The nature of colonoscopy as well as which risks, benefits, and alternatives were reviewed. She understood and agreed to proceed. Movi prep prescribed. The patient instructed on its use  Other Orders: Colonoscopy (Colon)  Patient Instructions: 1)  Colonoscopy [place] 11/01/09 11:00 am 2)  Movi prep instructions given 3)  Movi prep Rx. sent to your pharmacy. 4)  Colonoscopy and Flexible Sigmoidoscopy brochure given.  5)  Take your evening dose of Insulin but hold morning dose along with your oral diabetic meds. 6)  Copy sent to : Garnet Koyanagi, DO  7)  The medication list was reviewed and reconciled.  All changed / newly prescribed medications were explained.  A complete medication list was  provided to the patient / caregiver. Prescriptions: MOVIPREP 100 GM  SOLR (PEG-KCL-NACL-NASULF-NA ASC-C) As per prep instructions.  #1 x 0   Entered by:   Randye Lobo NCMA   Authorized by:   Irene Shipper MD   Signed by:   Randye Lobo NCMA on 09/19/2010   Method used:   Electronically to        [facility] (retail)       [phone]       [place], [place]  [address]       Ph: [phone]       Fax: [phone]   RxID:   [id number] MOVIPREP 100 GM  SOLR (PEG-KCL-NACL-NASULF-NA ASC-C) As per prep instructions.  #1 x 0   Entered by:   Randye Lobo NCMA   Authorized by:   Irene Shipper MD   Signed by:   Randye Lobo NCMA on 09/19/2010   Method used:   Historical   RxID:   [id number]

## 2011-01-12 LAB — URINALYSIS, ROUTINE W REFLEX MICROSCOPIC
Bilirubin Urine: NEGATIVE
Leukocytes, UA: NEGATIVE
Nitrite: NEGATIVE
Specific Gravity, Urine: 1.022 (ref 1.005–1.030)
Urobilinogen, UA: 0.2 mg/dL (ref 0.0–1.0)
pH: 7 (ref 5.0–8.0)

## 2011-01-12 LAB — CBC
HCT: 42.6 % (ref 36.0–46.0)
Hemoglobin: 13.9 g/dL (ref 12.0–15.0)
MCH: 27.5 pg (ref 26.0–34.0)
MCHC: 32.8 g/dL (ref 30.0–36.0)

## 2011-01-12 LAB — GLUCOSE, CAPILLARY: Glucose-Capillary: 242 mg/dL — ABNORMAL HIGH (ref 70–99)

## 2011-01-12 LAB — DIFFERENTIAL
Basophils Relative: 2 % — ABNORMAL HIGH (ref 0–1)
Eosinophils Absolute: 0.1 10*3/uL (ref 0.0–0.7)
Eosinophils Relative: 1 % (ref 0–5)
Monocytes Absolute: 0.5 10*3/uL (ref 0.1–1.0)
Monocytes Relative: 8 % (ref 3–12)

## 2011-01-12 LAB — BASIC METABOLIC PANEL
CO2: 30 mEq/L (ref 19–32)
Glucose, Bld: 277 mg/dL — ABNORMAL HIGH (ref 70–99)
Potassium: 4.2 mEq/L (ref 3.5–5.1)
Sodium: 139 mEq/L (ref 135–145)

## 2011-01-12 LAB — URINE MICROSCOPIC-ADD ON

## 2011-01-12 LAB — URINE CULTURE

## 2011-01-27 ENCOUNTER — Other Ambulatory Visit: Payer: Self-pay | Admitting: Family Medicine

## 2011-01-30 MED ORDER — HYDROCODONE-IBUPROFEN 7.5-200 MG PO TABS: ORAL_TABLET | ORAL | Status: DC

## 2011-01-30 NOTE — Telephone Encounter (Signed)
Rcv'd request--Rx filled 01/13/11 I called pharmacy [facility] and they never rcv'd RX, per pharmacist  they last filled RX quantity 30 on 12/14/10. Please advise      KP

## 2011-01-30 NOTE — Telephone Encounter (Signed)
Faxed to [facility]

## 2011-02-27 ENCOUNTER — Other Ambulatory Visit: Payer: Self-pay | Admitting: Family Medicine

## 2011-02-27 NOTE — Telephone Encounter (Signed)
Last seen 09/16/10 and filled 01/27/11 please advise     KP

## 2011-02-27 NOTE — Telephone Encounter (Signed)
Faxed.   KP 

## 2011-03-17 NOTE — Cardiovascular Report (Signed)
[name], [name] NO.:  [id number]   MEDICAL RECORD NO.:  [id number]          PATIENT TYPE:  INP   LOCATION:  2039                         FACILITY:  [facility]   PHYSICIAN:  Scarlett Presto, M.D.   DATE OF BIRTH:  03-23-1956   DATE OF PROCEDURE:  04/02/2006  DATE OF DISCHARGE:                              CARDIAC CATHETERIZATION   PRIMARY CARE PHYSICIAN:  Colon Branch, MD [facility]   HISTORY OF PRESENT ILLNESS:  [name] [name] is a 55 year old woman with multiple  cardiac risk factors who is admitted to the medicine service with chest  discomfort. She ruled out for myocardial infarction, had mildly abnormal  electrocardiogram, and when I discussed with her options with her she chose  heart catheterization. She has an evaluation for ischemia.   PROCEDURE PERFORMED:  1.  Left heart catheterization.  2.  Selective coronary angiography.  3.  Left ventriculography.   DETAILS OF THE PROCEDURE:  After obtaining informed consent the patient was  brought to the cardiac catheterization laboratory in fasting state where she  was prepped and draped in the usual sterile manner. Local anesthetic was  obtained over the right groin using 1% lidocaine without epinephrine. The  right femoral artery was cannulated using the modified Seldinger technique  with a 6-French 10-cm sheath. Then left heart catheterization was performed  using a 6-French Judkins' left #4, a 6-French Judkins' right #4, and a 6-  French pigtail catheter. The pigtail catheter was used for left  ventriculography in the RAO view. At the conclusion of the procedures the  catheters were removed, the patient was moved back to the cardiology holding  area. The femoral artery sheath was removed. Hemostasis was obtained using  direct manual pressure. At the conclusion of the hold there was no evidence  of ecchymosis or hematoma formation, and distal pulses were intact. Total  fluoroscopic time was 2.0 minutes. Total iodinized  contrast used was 65 mL.   RESULTS:  Aortic pressure 109/66 with a mean of 86. Left ventricular  pressure 109/0.  End-diastolic pressure of 8 mmHg.   SELECTIVE CORONARY ANGIOGRAPHY:  The left main coronary artery is a moderate  caliber vessel which is angiographically normal.   A left circumflex coronary artery is a large dominant vessel with a large  bifurcating obtuse marginal, two moderate caliber posterolateral branches, a  moderate caliber posterior descending coronary artery. There were luminal  irregularities in the mid portion of the circumflex coronary artery.   The left anterior descending coronary artery is a moderate caliber vessel  with two diagonal branches. It is transapical and without significant  disease.   The right coronary artery is a small nondominant vessel with no significant  disease.   The left ventriculogram reveals preserved LV systolic function of 46% with  no wall motion abnormalities.   ASSESSMENT:  1.  Noncardiac chest pain.  2.  Nonobstructive left dominant system. Normal left heart pressures.   RECOMMENDATIONS:  This is a patient who needs to have her cardiac risk  factors maximally controlled. She can be discharged in the morning. She  will  follow up in the [employer] office PA to check her groin in three  weeks.      Scarlett Presto, M.D.  Electronically Signed     JH/MEDQ  D:  04/02/2006  T:  04/03/2006  Job:  [id number]
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
## 2011-03-17 NOTE — Consult Note (Signed)
NAMEARDELL, AARONSON NO.:  [id number]   MEDICAL RECORD NO.:  [id number]          PATIENT TYPE:  INP   LOCATION:  2039                         FACILITY:  [facility]   PHYSICIAN:  Scarlett Presto, M.D.   DATE OF BIRTH:  [birth year]   DATE OF CONSULTATION:  04/01/2006  DATE OF DISCHARGE:                                   CONSULTATION   HISTORY OF PRESENT ILLNESS:  [name] is a 55 year old woman who has  diabetes, hypertension, and a family history of severe coronary disease and  cardiac sudden death who presented on 04/13/2006, with chest discomfort to  her physician's office.  She was admitted to the hospital, rule out  myocardial infarction.  She was treated with Protonix and Xanax with  significant resolution of her discomfort, however it recurred yesterday.  She was again treated with Xanax with significant improvement.  She has had  no chest pain for the last 24 hours.  She has also poorly-controlled  diabetes which required treatment in the hospital.  She is currently without  symptoms.  She states that the discomfort has been something she has dealt  with for several years.  It comes and goes but it has been particularly  severe over the last few days associated with some shortness of breath.  She  describes it as severe right anterior chest discomfort, piercing sensation,  goes through to her chest, radiates down her left arm associated with  shortness of breath and diaphoresis.  It usually occurs when she is  physically active and that is what happened this last episode.  She was  walking to the shower and had significant discomfort and shortness of  breath.  It usually does not last more than about 10-15 minutes, resolves  spontaneously in most cases usually just with rest.  She denies any PND or  orthopnea.  No lower extremity edema.  No other significant symptoms.  She  is financially challenged and unfortunately ran out of her diabetic  medications, and  her diabetes was out of control which may have precipitated  this event.   PAST MEDICAL HISTORY:  Her past medical history is significant for diabetes.  She is on oral agents.  She does not have any history of end organ damage.  She has hypertension which is reasonably well-controlled.  There is a  question of alcohol abuse vs. excessive use, however she denies this.  She  has a history of a kidney transplant.  She states that when she was 67, she  got a kidney donated from her twin brother.  Subsequent to that, she had no  antirejection medication, no immunosuppressive agents, and she has had  extensive plastic surgery to her left lower back to remove the scar and says  that she now no longer has a scar, and she has never had any treatment for  her kidney problem after this.  She has normal kidney function.  She also  had a hysterectomy in the past, from what I understand.   CURRENT MEDICATIONS:  Her current medications are as follows:  1.  She is on an insulin sliding scale.  2.  She is on metformin 500 mg twice a day.  3.  She is on aspirin 81 mg a day.  4.  She is on Protonix 40 mg once a day.  5.  She takes Lantus insulin 10 units daily.  6.  She is on Amaryl 2 mg once a day.  7.  Xanax 0.5 mg every 4 hours p.r.n.  8.  Nitroglycerin sublingual p.r.n.   SOCIAL HISTORY:  She does not smoke.  She says that she drinks on a very  rare occasion.   ALLERGIES:  SHE STATES THAT SHE IS ALLERGIC TO PENICILLIN.   FAMILY HISTORY:  Her mother has diabetes and hypertension but is still alive  and healthy.  Her father has leukemia and heart disease, however she reports  that he is over 47 years old and is still living.  Her brother has a  history of diabetes and died suddenly at age 64 of a heart attack.  She has  no other siblings.   REVIEW OF SYSTEMS:  She denies any headaches or dizziness.  No difficulty  swallowing.  No pains in her neck.  No sinus tenderness or sinus discharge.  No  visual problems.  No dental problems.  No hearing problems.  She denies  any coughing or wheezing.  She did have the shortness of breath and chest  pain as previously described but no PND or orthopnea.  No claudication.  She  denies any urinary frequency or urgency.  No dysuria.  No diarrhea or  constipation.  No bright red blood per rectum.  No melena, no hematochezia,  no hematemesis.  She states that she has had a little bit of dysuria  recently.  She reports a significant amount of anxiety.   PHYSICAL EXAMINATION:  GENERAL:  She is a moderately obese white female in  no apparent distress.  She is alert and oriented x 4.  VITAL SIGNS:  She is afebrile, her pulse is 64, respirations are 18, her  blood pressure is 103/71.  HEENT:  Unremarkable.  NECK:  Supple.  There is no jugular venous distention or carotid bruits.  Her thyroid appears to be normal size in the midline.  CHEST:  Clear to auscultation.  CARDIOVASCULAR:  Regular.  I do not appreciate a murmur.  Her point of  maximal impulse is not displaced.  First and second heart sounds sound  normal.  ABDOMEN:  Soft and nontender.  Her abdomen has no hepatosplenomegaly.  EXTREMITIES:  Lower extremities are without significant edema.  Pulses are  2+.  There are no bruits.  BACK:  Specific inspection of her bilateral lower back/flank area shows no  evidence of surgical intervention that I can appreciate, no surgical scars.   ASSESSMENT:  1.  Chest pain which is atypical but has elements of concerning nature.  She      has a family history of coronary disease, diabetes, hypertension,      unknown lipids status.  Reviewing her electrocardiogram, she appears to      have some T-wave inversion in the inferior leads with ST segment      depression in the inferolateral leads.  This is relatively subtle and      certainly not characteristic or classic for ischemia but of some     concern.  Her cardiac enzymes are negative x 3.  2.   Diabetes which is poorly-controlled secondary to noncompliance.  She  does not have any obvious end organ damage.  Her hemoglobin A1C in the      hospital is 13.8.  3.  Hypertension which is reasonably well-controlled currently on      straightforward medications.  4.  History of a kidney transplant which I find sort of difficult to      appreciate especially in the absence of a surgical scar, certainly in      the absence of antirejection medication although she got a kidney from      an identical twin, however she is reporting a fraternal twin, so it is      difficult to know exactly the history of this and I am somewhat dubious      of this history.   RECOMMENDATIONS:  So my recommendations are I would be interested in  potentially catheterizing this lady.  We talked a little bit about it and  went over the risks and potential outcomes and likely benefits to her.  She  is uncertain as to what she wants to do and certainly she is going to  discuss this with her family.  We will see her back tomorrow and have a more  detailed discussion and see what she thinks.  I would hold her Glucophage  for now.  I  think she probably can be successfully hydrated overnight with little risk.  Would continue her aspirin.  It might be reasonable to start her on Lovenox  subcutaneously twice a day, 1 mg per kg.  I will follow her with you.  I  would probably check some fasting lipids as well.      Scarlett Presto, M.D.  Electronically Signed     JH/MEDQ  D:  04/01/2006  T:  04/02/2006  Job:  [id number]   cc:   Colon Branch, MD [facility]  [phone] [address] [place], [place] [address]

## 2011-03-17 NOTE — Discharge Summary (Signed)
[name], [name]                 ACCOUNT NO.:  [id number]   MEDICAL RECORD NO.:  [id number]          PATIENT TYPE:  INP   LOCATION:  2039                         FACILITY:  [facility]   PHYSICIAN:  [name] [name], D.O. [facility]   DATE OF BIRTH:  1956/10/13   DATE OF ADMISSION:  03/30/2006  DATE OF DISCHARGE:  04/03/2006                                 DISCHARGE SUMMARY   DISCHARGE DIAGNOSES:  1.  Atypical chest pain with normal cardiac cath.  2.  Type 2 diabetes, uncontrolled with hemoglobin A1c of 13.8.  3.  Dyslipidemia.   DISCHARGE MEDICATIONS:  1.  Lantus 25 units subcu daily.  2.  Amaryl 2 mg p.o. b.i.d. a.c.  3.  Metformin 1000 mg p.o. b.i.d.  Patient is to restart this on April 06, 2006.  4.  Pravastatin 40 mg at bedtime.  5.  Aspirin 81 mg once daily.   FOLLOWUP INSTRUCTIONS:  She is to call Dr. Ethel [name] office for an appointment  in 1 week.  She is also instructed to follow up with cardiology for a groin  check in 1-2 weeks.  She also needs a fasting lipid profile and repeat LFTs  in approximately 1 month.   HOSPITAL COURSE:  Patient is 55 year old white female with history of  uncontrolled diabetes who presented with right-sided and also mid chest pain  with some radiation to the right arm with some atypical and also typical  features.  Due to her risk factors, the patient was admitted for evaluation.  Patient's chest pain symptoms did somewhat improve with Protonix, but did  not completely resolve.  She was seen by Dr. Wilhemina [name] of [facility]  who recommended cardiac cath.  This was performed on April 02, 2006.  Patient  is noted to have normal coronaries and an EF of 60%.   Following cardiac cath, she did have some right groin discomfort.  She was  evaluated by cardiology and also a CAT scan was ordered, which was reported  negative for bleeding.  At the time of discharge, the patient did not have  any firmness in her right groin and was actually evaluated by the cardiac  nurse.   PROBLEMS:  1.  Atypical chest pain, likely gastrointestinal source.  Patient may      continue proton pump inhibitor as an outpatient.  2.  Type 2 diabetes, uncontrolled.  Patient is to continue Lantus at home at      25 units.  Will likely need upward titration for glycemic control.  3.  Dyslipidemia.  During her hospitalization, lipids were obtained.  HDL      was low at 28, LDL 111.  Patient's goal is less than 70 and was started      on pravastatin to be taken at bedtime.  4.  History of kidney transplant.  Her kidney function remained stable      during hospitalization.   LABORATORY DATA OF DISCHARGE:  CBC:  WBC 5.7, H&H of 13.8/39.8, platelet  count of 137.  Blood sugar was 220.   If patient  experiences any groin pain or discomfort or has low blood sugars,  she is to contact Dr. Larose [name] immediately.      [name] [name], D.O. [facility]  Electronically Signed     RY/MEDQ  D:  04/03/2006  T:  04/03/2006  Job:  [id number]   cc:   [name] Branch, MD [facility]  [phone] [address] [place], [place] [address]

## 2011-03-17 NOTE — H&P (Signed)
NAMEVERNIDA, [name] NO.:  [id number]   MEDICAL RECORD NO.:  [id number]           PATIENT TYPE:   LOCATION:                                 FACILITY:   PHYSICIAN:  Colon Branch, MD [facility]         DATE OF BIRTH:   DATE OF ADMISSION:  DATE OF DISCHARGE:                                HISTORY & PHYSICAL   CHIEF COMPLAINT:  Diabetes.   HISTORY OF PRESENT ILLNESS:  [name] is a 55 year old white female with  history of diabetes, who ran out of her medications that include  ActosPlusmet and lisinopril about 8-9 weeks ago.  She has been feeling  progressively worse.  She, in fact, complained of decreased energy, dry  mouth, increased urination, feeling lightheaded.  In addition to that,  during this visit she also mentioned that she is having chest pain.  This  started about 8-9 weeks ago.  The chest pain is located in the middle of the  chest.  It is sharp, lists 15 minutes.  It has an episode every 2 days  approximately.  It has occasional radiation to the right shoulder and is  occasionally associated with nausea and shortness of breath.  It is not  triggered by walking but mostly when she is stressed out.  At the office the  cholesterol was 401, and I was very concerned about the chest pain in this  patient with diabetes and a family history of coronary artery disease.  Consequently, I decided to admit her to [facility] to rule her out.   PAST MEDICAL HISTORY:  1.  Diabetes diagnosed in 2004.  2.  Hypertension diagnosed in 2003.  3.  Allergies.  4.  Hysterectomy, complete, in 1995.  5.  Cholecystectomy in [place].  6.  C-section x2 in the past.   FAMILY HISTORY:  1.  Mother:  She has diabetes, hypertension and some mental problems.  2.  Father has leukemia, heart disease and diabetes.  He is, however, 55      years old and still living.  No history of rectal or colon cancer.  3.  Brother:  He had diabetes, renal insufficiency.  He died at age 23 of a      heart  attack.   REVIEW OF SYSTEMS:  She admits to some cough and mild heartburn.  No nausea,  no diarrhea or stomach upset.  No hematuria, but she does have occasional  dysuria.   The patient is married, has one child.  She actually lost another child 20  years ago.  She admits to anxiety.   MEDICATIONS:  At present, none.   SOCIAL HISTORY:  Does not smoke and drinks socially.   ALLERGIES:  PENICILLIN.   PHYSICAL EXAMINATION:  GENERAL:  The patient is alert and oriented.  She is  anxious, tearful at times.  VITAL SIGNS:  Pulse 82, blood pressure 112/80, weight 189 pounds.  She is 5  feet 2 inches tall.  LUNGS:  Clear to auscultation bilaterally.  CARDIOVASCULAR:  Regular rate and rhythm without  a murmur.  ABDOMEN:  Not distended, soft, good bowel sounds, no organomegaly.  EXTREMITIES:  No edema.  NEUROLOGIC:  Speech, gait and motor are intact.  Again, she displays some  emotional distress during the office visit.   LABORATORY DATA AND X-RAYS:  Urinalysis shows just trace leukocytes, is  positive for sugar but negative for ketones.  A culture was sent.  EKG shows  no acute changes.   ASSESSMENT AND PLAN:  The patient is a new patient that presents to the  office with uncontrolled diabetes due to poor compliance.  The review of  systems disclosed chest pain.  Again, I am concerned about the fact that the  patient has diabetes and a positive family history.  Consequently, she will  be admitted to the hospital to telemetry.  Will rule her out for an acute  coronary syndrome and try to control her diabetes with Amaryl and  Glucophage.  I also contacted cardiology service, and they are aware of this  admission.  We agree that we are going to schedule adenosine Myoview with  [facility] while she is in the hospital.  If all the tests come back okay, I  anticipate she will be able to go home within 24-36 hours.  I am planning to  call the team that is going to work this weekend and make them  aware of this  admission.      Colon Branch, MD [facility]  Electronically Signed     JEP/MEDQ  D:  03/30/2006  T:  03/30/2006  Job:  [id number]

## 2011-03-30 ENCOUNTER — Other Ambulatory Visit: Payer: Self-pay | Admitting: Family Medicine

## 2011-03-31 NOTE — Telephone Encounter (Signed)
[place] for #30, no refills

## 2011-03-31 NOTE — Telephone Encounter (Signed)
Last seen 09/16/10 and filled 02/27/11 please advise---- Dr.Lowne patient       KP

## 2011-04-01 ENCOUNTER — Other Ambulatory Visit: Payer: Self-pay | Admitting: Family Medicine

## 2011-04-03 NOTE — Telephone Encounter (Signed)
Refaxed paper copy    Kp

## 2011-04-04 ENCOUNTER — Other Ambulatory Visit: Payer: Self-pay | Admitting: Family Medicine

## 2011-04-28 ENCOUNTER — Other Ambulatory Visit: Payer: Self-pay | Admitting: Family Medicine

## 2011-04-28 DIAGNOSIS — G8929 Other chronic pain: Secondary | ICD-10-CM

## 2011-04-28 NOTE — Telephone Encounter (Signed)
Faxed.   KP 

## 2011-04-28 NOTE — Telephone Encounter (Signed)
Last refilled 03/30/11. Please advise.

## 2011-04-28 NOTE — Telephone Encounter (Signed)
Dr Nonda Lou pt

## 2011-04-28 NOTE — Telephone Encounter (Signed)
Last seen 09/16/10 and filled 03/30/11 please advise   KP

## 2011-05-29 ENCOUNTER — Other Ambulatory Visit: Payer: Self-pay | Admitting: Family Medicine

## 2011-05-29 NOTE — Telephone Encounter (Signed)
Last OV- 09/16/10 Last filled 04/28/11- #30, no rfs.

## 2011-05-29 NOTE — Telephone Encounter (Signed)
OK X1 

## 2011-07-11 ENCOUNTER — Other Ambulatory Visit: Payer: Self-pay | Admitting: Internal Medicine

## 2011-07-12 NOTE — Telephone Encounter (Signed)
Last seen 09/16/10 and filled 05/29/11 please advise    KP

## 2011-07-12 NOTE — Telephone Encounter (Signed)
Pt needs ov. 

## 2011-07-13 NOTE — Telephone Encounter (Signed)
Discussed with patient and scheduled appt for Monday    KP

## 2011-07-17 ENCOUNTER — Ambulatory Visit (INDEPENDENT_AMBULATORY_CARE_PROVIDER_SITE_OTHER): Payer: Self-pay | Admitting: Family Medicine

## 2011-07-17 ENCOUNTER — Encounter: Payer: Self-pay | Admitting: Family Medicine

## 2011-07-17 DIAGNOSIS — Z23 Encounter for immunization: Secondary | ICD-10-CM

## 2011-07-17 DIAGNOSIS — I10 Essential (primary) hypertension: Secondary | ICD-10-CM

## 2011-07-17 DIAGNOSIS — F329 Major depressive disorder, single episode, unspecified: Secondary | ICD-10-CM

## 2011-07-17 DIAGNOSIS — G8929 Other chronic pain: Secondary | ICD-10-CM

## 2011-07-17 DIAGNOSIS — F3289 Other specified depressive episodes: Secondary | ICD-10-CM

## 2011-07-17 DIAGNOSIS — E785 Hyperlipidemia, unspecified: Secondary | ICD-10-CM

## 2011-07-17 DIAGNOSIS — F32A Depression, unspecified: Secondary | ICD-10-CM

## 2011-07-17 DIAGNOSIS — E119 Type 2 diabetes mellitus without complications: Secondary | ICD-10-CM

## 2011-07-17 DIAGNOSIS — E1149 Type 2 diabetes mellitus with other diabetic neurological complication: Secondary | ICD-10-CM

## 2011-07-17 DIAGNOSIS — G609 Hereditary and idiopathic neuropathy, unspecified: Secondary | ICD-10-CM

## 2011-07-17 DIAGNOSIS — G629 Polyneuropathy, unspecified: Secondary | ICD-10-CM

## 2011-07-17 DIAGNOSIS — F411 Generalized anxiety disorder: Secondary | ICD-10-CM

## 2011-07-17 LAB — POCT URINALYSIS DIPSTICK
Bilirubin, UA: NEGATIVE
Leukocytes, UA: NEGATIVE
Nitrite, UA: NEGATIVE
Urobilinogen, UA: 0.2

## 2011-07-17 MED ORDER — GABAPENTIN 300 MG PO CAPS: 600.0000 mg | ORAL_CAPSULE | Freq: Three times a day (TID) | ORAL | Status: DC

## 2011-07-17 MED ORDER — HYDROCODONE-IBUPROFEN 7.5-200 MG PO TABS: ORAL_TABLET | ORAL | Status: DC

## 2011-07-17 MED ORDER — LISINOPRIL 5 MG PO TABS: 5.0000 mg | ORAL_TABLET | Freq: Every day | ORAL | Status: DC

## 2011-07-17 MED ORDER — DULOXETINE HCL 60 MG PO CPEP: 60.0000 mg | ORAL_CAPSULE | Freq: Every day | ORAL | Status: DC

## 2011-07-17 NOTE — Assessment & Plan Note (Signed)
Per endo Labs to be done today

## 2011-07-17 NOTE — Progress Notes (Signed)
  Subjective:    Patient ID: Earnie Larsson, female    DOB: [birth year], 55 y.o.   MRN: [id number]  HPI HYPERTENSION Disease Monitoring Blood pressure range-nml Chest pain- no      Dyspnea- no Medications Compliance- poor Lightheadedness- no   Edema- no   DIABETES Disease Monitoring Blood Sugar ranges-improved Polyuria- no New Visual problems- no Medications Compliance- poor Hypoglycemic symptoms- no   HYPERLIPIDEMIA Disease Monitoring See symptoms for Hypertension Medications Compliance- poor RUQ pain- no  Muscle aches- no  Pt states she runs out of meds frequently because she can not afford some.  Stopping the cymbalta has caused increased stress in the pt.   ROS See HPI above   PMH Smoking Status noted     Review of Systems    as above Objective:   Physical Exam  Constitutional: She is oriented to person, place, and time. She appears well-developed and well-nourished.  Cardiovascular: Normal rate, regular rhythm and normal heart sounds.   No murmur heard. Pulmonary/Chest: Effort normal and breath sounds normal. No respiratory distress. She has no wheezes. She has no rales. She exhibits no tenderness.  Abdominal: Soft. Bowel sounds are normal.  Musculoskeletal: She exhibits no edema.  Neurological: She is alert and oriented to person, place, and time.  Psychiatric: She has a normal mood and affect. Her behavior is normal. Judgment and thought content normal.          Assessment & Plan:

## 2011-07-17 NOTE — Assessment & Plan Note (Signed)
Refill cymbalta

## 2011-07-17 NOTE — Assessment & Plan Note (Signed)
Per neuro con't meds

## 2011-07-17 NOTE — Patient Instructions (Signed)
Diabetes Meal Planning Guide The diabetes meal planning guide is a tool to help you plan your meals and snacks. It is important for people with diabetes to manage their blood sugar levels. Choosing the right foods and the right amounts throughout your day will help control your blood sugar. Eating right can even help you improve your blood pressure and reach or maintain a healthy weight. CARBOHYDRATE COUNTING MADE EASY When you eat carbohydrates, they turn to sugar (glucose). This raises your blood sugar level. Counting carbohydrates can help you control this level so you feel better. When you plan your meals by counting carbohydrates, you can have more flexibility in what you eat and balance your medicine with your food intake. Carbohydrate counting simply means adding up the total amount of carbohydrate grams (g) in your meals or snacks. Try to eat about the same amount at each meal. Foods with carbohydrates are listed below. Each portion below is 1 carbohydrate serving or 15 grams of carbohydrates. Ask your dietician how many grams of carbohydrates you should eat at each meal or snack. Grains and Starches 1 slice bread 1/2 English muffin or hotdog/hamburger bun 3/4 cup cold cereal (unsweetened) 1/3 cup cooked pasta or rice 1/2 cup starchy vegetables (corn, potatoes, peas, beans, winter squash) 1 tortilla (6 inches) 1/4 bagel 1 waffle or pancake (size of a CD) 1/2 cup cooked cereal 4 to 6 small crackers *Whole grain is recommended Fruit 1 cup fresh unsweetened berries, melon, papaya, pineapple 1 small fresh fruit 1/2 banana or mango 1/2 cup fruit juice (4 ounces unsweetened) 1/2 cup canned fruit in natural juice or water 2 tablespoons dried fruit 12 to 15 grapes or cherries Milk and Yogurt 1 cup fat-free or 1% milk 1 cup soy milk 6 ounces light yogurt with sugar-free sweetener 6 ounces low-fat soy yogurt 6 ounces plain yogurt Vegetables 1 cup raw or 1/2 cup cooked is counted as 0  carbohydrates or a "free" food. If you eat 3 or more servings at one meal, count them as 1 carbohydrate serving. Other Carbohydrates 3/4 ounces chips or pretzels 1/2 cup ice cream or frozen yogurt 1/4 cup sherbet or sorbet 2 inch square cake, no frosting 1 tablespoon honey, sugar, jam, jelly, or syrup 2 small cookies 3 squares of graham crackers 3 cups popcorn 6 crackers 1 cup broth-based soup Count 1 cup casserole or other mixed foods as 2 carbohydrate servings. Foods with less than 20 calories in a serving may be counted as 0 carbohydrates or a "free" food. You may want to purchase a book or computer software that lists the carbohydrate gram counts of different foods. In addition, the nutrition facts panel on the labels of the foods you eat are a good source of this information. The label will tell you how big the serving size is and the total number of carbohydrate grams you will be eating per serving. Divide this number by 15 to obtain the number of carbohydrate servings in a portion. Remember: 1 carbohydrate serving equals 15 grams of carbohydrate. SERVING SIZES Measuring foods and serving sizes helps you make sure you are getting the right amount of food. The list below tells how big or small some common serving sizes are.  1 ounce (oz) of cheese.................................4 stacked dice.   2 to 3 oz cooked meat..................................Deck of cards.   1 teaspoon (tsp)............................................Tip of little finger.   1 tablespoon (tbs).........................................Thumb.   2 tbs.............................................................Golf ball.    cup...........................................................Half of a fist.   1 cup............................................................A fist.    SAMPLE DIABETES MEAL PLAN Below is a sample meal plan that includes foods from the grain and starches, dairy, vegetable, fruit, and  meat groups. A dietician can individualize a meal plan to fit your calorie needs and tell you the number of servings needed from each food group. However, controlling the total amount of carbohydrates in your meal or snack is more important than making sure you include all of the food groups at every meal. You may interchange carbohydrate containing foods (dairy, starches, and fruits). The meal plan below is an example of a 2000 calorie diet using carbohydrate counting. This meal plan has 17 carbohydrate servings (carb choices). Breakfast 1 cup oatmeal (2 carb choices) 3/4 cup light yogurt (1 carb choice) 1 cup blueberries (1 carb choice) 1/4 cup almonds  Snack 1 large apple (2 carb choices) 1 low-fat string cheese stick  Lunch Chicken breast salad:  1 cup spinach   1/4 cup chopped tomatoes   2 oz chicken breast, sliced   2 tbs low-fat Italian dressing  12 whole-wheat crackers (2 carb choices) 12 to 15 grapes (1 carb choice) 1 cup low-fat milk (1 carb choice)  Snack 1 cup carrots 1/2 cup hummus (1 carb choice)  Dinner 3 oz broiled salmon 1 cup brown rice (3 carb choices)  Snack 1 1/2 cups steamed broccoli (1 carb choice) drizzled with 1 tsp olive oil and lemon juice 1 cup light pudding (2 carb choices)  DIABETES MEAL PLANNING WORKSHEET Your dietician can use this worksheet to help you decide how many servings of foods and what types of foods are right for you.  Breakfast Food Group and Servings Carb Choices Grain/Starches _______________________________________ Dairy ______________________________________________ Vegetable _______________________________________ Fruit _______________________________________________ Meat _______________________________________________ Fat _____________________________________________ Lunch Food Group and Servings Carb Choices Grain/Starches ________________________________________ Dairy _______________________________________________ Fruit  ________________________________________________ Meat ________________________________________________ Fat _____________________________________________ Dinner Food Group and Servings Carb Choices Grain/Starches ________________________________________ Dairy _______________________________________________ Fruit ________________________________________________ Meat ________________________________________________ Fat _____________________________________________ Snacks Food Group and Servings Carb Choices Grain/Starches ________________________________________ Dairy _______________________________________________ Vegetable ________________________________________ Fruit ________________________________________________ Meat ________________________________________________ Fat _____________________________________________ Daily Totals Starches _________________________ Vegetable __________________________ Fruit ______________________________ Dairy ______________________________ Meat ______________________________ Fat ________________________________  Document Released: 07/13/2005 Document Re-Released: 04/05/2010 [employer] Patient Information 2011 [employer]. 

## 2011-07-17 NOTE — Assessment & Plan Note (Signed)
Pt has not been on med Start lisinopril

## 2011-07-17 NOTE — Assessment & Plan Note (Signed)
Check labs con't meds 

## 2011-07-18 LAB — BASIC METABOLIC PANEL
BUN: 11 mg/dL (ref 6–23)
CO2: 28 mEq/L (ref 19–32)
Calcium: 9.3 mg/dL (ref 8.4–10.5)
Creatinine, Ser: 0.8 mg/dL (ref 0.4–1.2)
Glucose, Bld: 341 mg/dL — ABNORMAL HIGH (ref 70–99)

## 2011-07-18 LAB — HEPATIC FUNCTION PANEL
ALT: 19 U/L (ref 0–35)
Albumin: 3.8 g/dL (ref 3.5–5.2)
Total Protein: 7.3 g/dL (ref 6.0–8.3)

## 2011-07-18 LAB — LIPID PANEL
Cholesterol: 192 mg/dL (ref 0–200)
Triglycerides: 119 mg/dL (ref 0.0–149.0)

## 2011-07-18 LAB — MICROALBUMIN / CREATININE URINE RATIO: Microalb Creat Ratio: 1.3 mg/g (ref 0.0–30.0)

## 2011-07-25 ENCOUNTER — Telehealth: Payer: Self-pay | Admitting: *Deleted

## 2011-07-25 MED ORDER — ATORVASTATIN CALCIUM 20 MG PO TABS: 20.0000 mg | ORAL_TABLET | Freq: Every day | ORAL | Status: DC

## 2011-07-25 NOTE — Telephone Encounter (Signed)
Message copied by Marylen Ponto on Tue Jul 25, 2011  6:07 PM ------      Message from: Rosalita Chessman      Created: Thu Jul 20, 2011  5:26 PM       Dm labs need to be seen by Dr Loanne Drilling            Cholesterol--- LDL goal < 70,  HDL >40  TG < 150.  Diet and exercise will increase HDL and decrease LDL and TG.  Fish,  Fish Oil, Flaxseed oil will also help increase the HDL and decrease Triglycerides.   Recheck labs in 3 months----  Change pravachol to lipitor 20 mg  #30  1 po qhs ,  2 refill.    272.4 lipid, hep   250.00  Bmp, [id number]

## 2011-07-25 NOTE — Telephone Encounter (Signed)
Discuss with patient, copy of labs mailed, Rx sent to pharmacy.

## 2011-07-31 ENCOUNTER — Encounter: Payer: Self-pay | Admitting: Family Medicine

## 2011-07-31 ENCOUNTER — Telehealth: Payer: Self-pay | Admitting: *Deleted

## 2011-07-31 ENCOUNTER — Ambulatory Visit: Payer: Self-pay | Admitting: Family Medicine

## 2011-07-31 DIAGNOSIS — Z0289 Encounter for other administrative examinations: Secondary | ICD-10-CM

## 2011-07-31 NOTE — Telephone Encounter (Signed)
Per MD, pt is due for F/U OV.  Appointment scheduled 08/08/2011 2:45pm.

## 2011-07-31 NOTE — Telephone Encounter (Signed)
Message copied by Legrand Como on Mon Jul 31, 2011  3:05 PM ------      Message from: Renato Shin      Created: Fri Jul 28, 2011  3:55 PM       please call patient:      Ov is due

## 2011-08-08 ENCOUNTER — Ambulatory Visit: Payer: Self-pay | Admitting: Endocrinology

## 2011-08-08 DIAGNOSIS — Z0289 Encounter for other administrative examinations: Secondary | ICD-10-CM

## 2011-08-09 LAB — BASIC METABOLIC PANEL WITH GFR
CO2: 26
Chloride: 100
Creatinine, Ser: 0.64
GFR calc Af Amer: >60
Glucose, Bld: 385 — ABNORMAL HIGH

## 2011-08-09 LAB — BASIC METABOLIC PANEL
BUN: 3 — ABNORMAL LOW
Calcium: 9.2
GFR calc non Af Amer: >60
Potassium: 3.9
Sodium: 132 — ABNORMAL LOW

## 2011-08-10 ENCOUNTER — Emergency Department (INDEPENDENT_AMBULATORY_CARE_PROVIDER_SITE_OTHER): Payer: Self-pay

## 2011-08-10 ENCOUNTER — Encounter (HOSPITAL_BASED_OUTPATIENT_CLINIC_OR_DEPARTMENT_OTHER): Payer: Self-pay | Admitting: *Deleted

## 2011-08-10 ENCOUNTER — Other Ambulatory Visit: Payer: Self-pay

## 2011-08-10 ENCOUNTER — Emergency Department (HOSPITAL_BASED_OUTPATIENT_CLINIC_OR_DEPARTMENT_OTHER)
Admission: EM | Admit: 2011-08-10 | Discharge: 2011-08-10 | Disposition: A | Discharge status: Home / Self Care | Payer: Self-pay | Attending: Emergency Medicine | Admitting: Emergency Medicine

## 2011-08-10 DIAGNOSIS — X58XXXA Exposure to other specified factors, initial encounter: Secondary | ICD-10-CM

## 2011-08-10 DIAGNOSIS — E119 Type 2 diabetes mellitus without complications: Secondary | ICD-10-CM | POA: Insufficient documentation

## 2011-08-10 DIAGNOSIS — IMO0001 Reserved for inherently not codable concepts without codable children: Secondary | ICD-10-CM | POA: Insufficient documentation

## 2011-08-10 DIAGNOSIS — I10 Essential (primary) hypertension: Secondary | ICD-10-CM | POA: Insufficient documentation

## 2011-08-10 DIAGNOSIS — Z79899 Other long term (current) drug therapy: Secondary | ICD-10-CM | POA: Insufficient documentation

## 2011-08-10 DIAGNOSIS — IMO0002 Reserved for concepts with insufficient information to code with codable children: Secondary | ICD-10-CM | POA: Insufficient documentation

## 2011-08-10 DIAGNOSIS — R079 Chest pain, unspecified: Secondary | ICD-10-CM

## 2011-08-10 DIAGNOSIS — S20219A Contusion of unspecified front wall of thorax, initial encounter: Secondary | ICD-10-CM | POA: Insufficient documentation

## 2011-08-10 DIAGNOSIS — E7800 Pure hypercholesterolemia, unspecified: Secondary | ICD-10-CM | POA: Insufficient documentation

## 2011-08-10 DIAGNOSIS — Y92009 Unspecified place in unspecified non-institutional (private) residence as the place of occurrence of the external cause: Secondary | ICD-10-CM | POA: Insufficient documentation

## 2011-08-10 DIAGNOSIS — J9819 Other pulmonary collapse: Secondary | ICD-10-CM

## 2011-08-10 HISTORY — DX: Pure hypercholesterolemia, unspecified: E78.00

## 2011-08-10 HISTORY — DX: Fibromyalgia: M79.7

## 2011-08-10 HISTORY — DX: Essential (primary) hypertension: I10

## 2011-08-10 MED ORDER — IBUPROFEN 800 MG PO TABS
800.0000 mg | ORAL_TABLET | Freq: Once | ORAL | Status: AC
  Administered 2011-08-10: 800 mg via ORAL
  Filled 2011-08-10: qty 1

## 2011-08-10 MED ORDER — HYDROCODONE-ACETAMINOPHEN 5-500 MG PO TABS: 1.0000 | ORAL_TABLET | Freq: Four times a day (QID) | ORAL | Status: AC | PRN

## 2011-08-10 NOTE — ED Notes (Signed)
C/o right rib pain after a child jumped on her right side. Also c/o some shortness of breath

## 2011-08-10 NOTE — ED Provider Notes (Signed)
History     CSN: [id number] Arrival date & time: 08/10/2011  1:25 PM  Chief Complaint  Patient presents with  . Chest Pain    HPI 55 year-old female presents with right rib pain. Patient states that yesterday she was lying on the floor and her grand son jumped off a sofa onto her right chest. Complaining of pain since that time it is worse with coughing, hiccups, deep breathing and movement. She denies any back pain. Denies abdominal pain, nausea, vomiting. She states she is experiencing shortness of breath when the pain is at its maximum. Currently the pain is 8/10 she's been taking Tylenol at home with minimal relief of the pain. Denies other complaints. She is not taking anticoagulants. There was no head trauma.    Past Medical History  Diagnosis Date  . Hypertension   . Fibromyalgia   . Diabetes mellitus   . High cholesterol     Past Surgical History  Procedure Date  . Abdominal hysterectomy   . Cholecystectomy   . Cesarean section   . Oophorectomy     Family History  Problem Relation Age of Onset  . Kidney disease Brother   . Diabetes Brother   . Kidney failure Brother   . Heart disease Brother 24  . Leukemia Father   . Heart disease Father   . Diabetes Father   . Colitis Father   . Crohn's disease Father   . Cancer Father     leukemia  . Diabetes Mother   . Hypertension Mother   . Mental illness Mother   . Irritable bowel syndrome Daughter     History  Substance Use Topics  . Smoking status: Never Smoker   . Smokeless tobacco: Never Used  . Alcohol Use: Yes     rare    OB History    Grav Para Term Preterm Abortions TAB SAB Ect Mult Living                  Review of Systems Negative except as noted in history of present illness  Allergies  Penicillins  Home Medications   Current Outpatient Rx  Name Route Sig Dispense Refill  . ATORVASTATIN CALCIUM 20 MG PO TABS Oral Take 1 tablet (20 mg total) by mouth daily. 30 tablet 2  . CALCIUM  CARBONATE-VITAMIN D 600-400 MG-UNIT PO TABS Oral Take 1 tablet by mouth daily.      [name] Kitchen CASANTHRANOL-DOCUSATE SODIUM 30-100 MG PO CAPS Oral Take by mouth.      . DULOXETINE HCL 60 MG PO CPEP Oral Take 1 capsule (60 mg total) by mouth daily. 30 capsule 5  . ESCITALOPRAM OXALATE 10 MG PO TABS Oral Take 10 mg by mouth daily.      [name] Kitchen GABAPENTIN 300 MG PO CAPS Oral Take 2 capsules (600 mg total) by mouth 3 (three) times daily. 180 capsule 5  . HYDROCODONE-ACETAMINOPHEN 5-500 MG PO TABS Oral Take 1-2 tablets by mouth every 6 (six) hours as needed for pain. 15 tablet 0  . HYDROCODONE-IBUPROFEN 7.5-200 MG PO TABS  1 po q6h prn 30 tablet 0  . INSULIN ISOPHANE & REGULAR (70-30) 100 UNIT/ML [place] SUSP Subcutaneous Inject into the skin. 60 units in the morning and 35 units in the evening     . LISINOPRIL 5 MG PO TABS Oral Take 1 tablet (5 mg total) by mouth daily. 30 tablet 2  . METFORMIN HCL 1000 MG PO TABS Oral Take 1,000 mg by mouth 2 (two)  times daily with a meal.      . ONE-DAILY MULTI VITAMINS PO TABS Oral Take 1 tablet by mouth daily.      [name] Kitchen OMEPRAZOLE 20 MG PO CPDR Oral Take 20 mg by mouth daily.      [name] Kitchen POLYETHYLENE GLYCOL 3350 PO PACK Oral Take 17 g by mouth daily.        BP 113/77  Pulse 101  Temp 97.4 F (36.3 C)  Resp 20  SpO2 95%  Physical Exam  Nursing note and vitals reviewed. Constitutional: She is oriented to person, place, and time. She appears well-developed.       Appears uncomfortable  HENT:  Head: Atraumatic.  Mouth/Throat: Oropharynx is clear and moist.  Eyes: Conjunctivae and EOM are normal. Pupils are equal, round, and reactive to light.  Neck: Normal range of motion. Neck supple.  Cardiovascular: Normal rate, regular rhythm, normal heart sounds and intact distal pulses.   Pulmonary/Chest: Effort normal and breath sounds normal. No respiratory distress. She has no wheezes. She has no rales. She exhibits tenderness.       She has diffuse right anterior rib tenderness to  palpation. There is no ecchymosis or bruising.  Abdominal: Soft. She exhibits no distension. There is no tenderness. There is no rebound and no guarding.  Musculoskeletal: Normal range of motion.  Neurological: She is alert and oriented to person, place, and time.  Skin: Skin is warm and dry. No rash noted.  Psychiatric: She has a normal mood and affect.    ED Course  Procedures (including critical care time)  Labs Reviewed - No data to display Dg Ribs Unilateral W/chest Right  08/10/2011  *RADIOLOGY REPORT*  Clinical Data: Right rib pain, injury 08/07/2011  RIGHT RIBS AND CHEST - 3+ VIEW  Comparison: Chest radiographs 11/02/2010  Findings: Upper-normal size of cardiac silhouette. Mediastinal contours and pulmonary vascularity normal. Bibasilar atelectasis. Lungs otherwise clear. No pleural effusion or pneumothorax. Mild diffuse osseous demineralization. Surgical clips right upper quadrant. BB placed at site of symptoms lower right chest. No rib fracture or bone destruction. Few calcified granulomata in liver.  IMPRESSION: Bibasilar atelectasis. No acute right rib abnormalities identified.  Original Report Authenticated By: Burnetta Sabin, M.D.    Date: 08/10/2011  Rate: 88  Rhythm: normal sinus rhythm  QRS Axis: normal  Intervals: normal  ST/T Wave abnormalities: nonspecific t wave abnl  Conduction Disutrbances:none  Narrative Interpretation:   Old EKG Reviewed: unchanged     1. Rib contusion       MDM  Rib contusion after mechanical injury. Patient can continue to take over-the-counter ibuprofen at home. Will prescribe Vicodin for breakthrough pain. Patient given incentive spirometer to the emergency department. She is to follow with her primary care Dr. precautions for return.        Blair Heys, MD 08/10/11 1907

## 2011-08-17 ENCOUNTER — Other Ambulatory Visit: Payer: Self-pay | Admitting: Family Medicine

## 2011-08-17 NOTE — Telephone Encounter (Signed)
Last OV 07-17-11, last filled 07-17-11 #30

## 2011-08-18 NOTE — Telephone Encounter (Signed)
Rx sent to pharmacy   

## 2011-09-29 ENCOUNTER — Telehealth: Payer: Self-pay | Admitting: *Deleted

## 2011-09-29 NOTE — Telephone Encounter (Signed)
Pt called to schedule Appt. Pt c/o chest pressure on left side and swelling, pain, tightness and cramping in legs, along with weakness/fatigue x4days. Pt denies any increasing in numbness, tingling, or SOB, but Pt notes that she usually has these symptoms due to current Dx/problems. Pt advised ED, Pt ok verbalized understanding and stated that she is leaving now to go to ED.

## 2011-09-29 NOTE — Telephone Encounter (Signed)
Agree w/ ER recommendations

## 2011-10-04 ENCOUNTER — Other Ambulatory Visit: Payer: Self-pay | Admitting: Family Medicine

## 2011-10-04 NOTE — Telephone Encounter (Signed)
Last seen 07/17/11 and filled 08/17/11 # 60. Please advise    KP

## 2011-10-05 MED ORDER — HYDROCODONE-IBUPROFEN 7.5-200 MG PO TABS: 1.0000 | ORAL_TABLET | Freq: Four times a day (QID) | ORAL | Status: DC | PRN

## 2011-10-05 NOTE — Telephone Encounter (Signed)
Faxed.   KP 

## 2011-10-23 ENCOUNTER — Other Ambulatory Visit: Payer: Self-pay | Admitting: Family Medicine

## 2011-11-09 ENCOUNTER — Ambulatory Visit: Payer: Self-pay | Admitting: Family Medicine

## 2011-11-13 ENCOUNTER — Encounter: Payer: Self-pay | Admitting: Family Medicine

## 2011-11-13 ENCOUNTER — Ambulatory Visit (INDEPENDENT_AMBULATORY_CARE_PROVIDER_SITE_OTHER): Payer: Self-pay | Admitting: Family Medicine

## 2011-11-13 VITALS — BP 120/82 | HR 108 | Temp 98.8°F | Wt 168.8 lb

## 2011-11-13 DIAGNOSIS — G8929 Other chronic pain: Secondary | ICD-10-CM

## 2011-11-13 DIAGNOSIS — I10 Essential (primary) hypertension: Secondary | ICD-10-CM

## 2011-11-13 DIAGNOSIS — H9209 Otalgia, unspecified ear: Secondary | ICD-10-CM

## 2011-11-13 DIAGNOSIS — R209 Unspecified disturbances of skin sensation: Secondary | ICD-10-CM

## 2011-11-13 DIAGNOSIS — T169XXA Foreign body in ear, unspecified ear, initial encounter: Secondary | ICD-10-CM

## 2011-11-13 DIAGNOSIS — R253 Fasciculation: Secondary | ICD-10-CM

## 2011-11-13 DIAGNOSIS — R259 Unspecified abnormal involuntary movements: Secondary | ICD-10-CM

## 2011-11-13 DIAGNOSIS — H60399 Other infective otitis externa, unspecified ear: Secondary | ICD-10-CM

## 2011-11-13 DIAGNOSIS — H6090 Unspecified otitis externa, unspecified ear: Secondary | ICD-10-CM

## 2011-11-13 DIAGNOSIS — E119 Type 2 diabetes mellitus without complications: Secondary | ICD-10-CM

## 2011-11-13 DIAGNOSIS — J329 Chronic sinusitis, unspecified: Secondary | ICD-10-CM

## 2011-11-13 DIAGNOSIS — R238 Other skin changes: Secondary | ICD-10-CM

## 2011-11-13 DIAGNOSIS — E785 Hyperlipidemia, unspecified: Secondary | ICD-10-CM

## 2011-11-13 MED ORDER — OFLOXACIN 0.3 % OT SOLN: OTIC | Status: DC

## 2011-11-13 MED ORDER — HYDROCODONE-IBUPROFEN 7.5-200 MG PO TABS: 1.0000 | ORAL_TABLET | Freq: Four times a day (QID) | ORAL | Status: DC | PRN

## 2011-11-13 MED ORDER — LISINOPRIL 5 MG PO TABS: 5.0000 mg | ORAL_TABLET | Freq: Every day | ORAL | Status: DC

## 2011-11-13 MED ORDER — ATORVASTATIN CALCIUM 20 MG PO TABS: ORAL_TABLET | ORAL | Status: DC

## 2011-11-13 MED ORDER — CLARITHROMYCIN ER 500 MG PO TB24: 1000.0000 mg | ORAL_TABLET | Freq: Every day | ORAL | Status: AC

## 2011-11-13 MED ORDER — FLUTICASONE PROPIONATE 50 MCG/ACT NA SUSP: 2.0000 | Freq: Every day | NASAL | Status: DC

## 2011-11-13 NOTE — Patient Instructions (Signed)

## 2011-11-13 NOTE — Progress Notes (Signed)
  Subjective:     [name] [name] is a 56 y.o. female who presents for evaluation of sinus pain. Symptoms include: congestion, cough, facial pain, fevers, headaches and sinus pressure. Onset of symptoms was 6 days ago. Symptoms have been gradually worsening since that time. Past history is significant for no history of pneumonia or bronchitis. Patient is a non-smoker.Pt has had a fever of 101 and body aches. The following portions of the patient's history were reviewed and updated as appropriate: allergies, current medications, past family history, past medical history, past social history, past surgical history and problem list.  Review of Systems Pertinent items are noted in HPI.   Objective:    BP 120/82  Pulse 108  Temp(Src) 98.8 F (37.1 C) (Oral)  Wt 168 lb 12.8 oz (76.567 kg)  SpO2 97% General appearance: alert, cooperative, appears stated age and no distress Head: Normocephalic, without obvious abnormality, atraumatic Ears: normal TM's and external ear canals both ears Nose: green discharge, mild congestion, sinus tenderness bilateral Throat: lips, mucosa, and tongue normal; teeth and gums normal Neck: mild anterior cervical adenopathy, supple, symmetrical, trachea midline and thyroid not enlarged, symmetric, no tenderness/mass/nodules Lungs: clear to auscultation bilaterally Heart: regular rate and rhythm, S1, S2 normal, no murmur, click, rub or gallop Extremities: extremities normal, atraumatic, no cyanosis or edema    Assessment:    Acute bacterial sinusitis.    Plan:    Neti pot recommended. Instructions given. Nasal steroids per medication orders. Antihistamines per medication orders. Biaxin per medication orders.

## 2011-11-15 ENCOUNTER — Other Ambulatory Visit (INDEPENDENT_AMBULATORY_CARE_PROVIDER_SITE_OTHER): Payer: Self-pay

## 2011-11-15 DIAGNOSIS — R253 Fasciculation: Secondary | ICD-10-CM

## 2011-11-15 DIAGNOSIS — E785 Hyperlipidemia, unspecified: Secondary | ICD-10-CM

## 2011-11-15 DIAGNOSIS — R259 Unspecified abnormal involuntary movements: Secondary | ICD-10-CM

## 2011-11-15 DIAGNOSIS — I10 Essential (primary) hypertension: Secondary | ICD-10-CM

## 2011-11-15 LAB — CBC WITH DIFFERENTIAL/PLATELET
Basophils Absolute: 0 10*3/uL (ref 0.0–0.1)
Basophils Relative: 0.5 % (ref 0.0–3.0)
Eosinophils Absolute: 0.1 10*3/uL (ref 0.0–0.7)
HCT: 40.8 % (ref 36.0–46.0)
Hemoglobin: 14 g/dL (ref 12.0–15.0)
Lymphs Abs: 1.7 10*3/uL (ref 0.7–4.0)
MCHC: 34.2 g/dL (ref 30.0–36.0)
MCV: 84.7 fl (ref 78.0–100.0)
Monocytes Absolute: 0.5 10*3/uL (ref 0.1–1.0)
Neutro Abs: 4.4 10*3/uL (ref 1.4–7.7)
RBC: 4.82 Mil/uL (ref 3.87–5.11)
RDW: 13.3 % (ref 11.5–14.6)

## 2011-11-15 LAB — BASIC METABOLIC PANEL
CO2: 28 mEq/L (ref 19–32)
Calcium: 9 mg/dL (ref 8.4–10.5)
Chloride: 100 mEq/L (ref 96–112)
Glucose, Bld: 437 mg/dL — ABNORMAL HIGH (ref 70–99)
Potassium: 4.8 mEq/L (ref 3.5–5.1)
Sodium: 134 mEq/L — ABNORMAL LOW (ref 135–145)

## 2011-11-15 LAB — LIPID PANEL
HDL: 44 mg/dL (ref >39.00)
LDL Cholesterol: 51 mg/dL (ref 0–99)
Total CHOL/HDL Ratio: 2

## 2011-11-15 LAB — HEPATIC FUNCTION PANEL
AST: 17 U/L (ref 0–37)
Albumin: 3.9 g/dL (ref 3.5–5.2)
Total Bilirubin: 0.7 mg/dL (ref 0.3–1.2)

## 2011-11-16 LAB — THYROID ANTIBODIES: Thyroperoxidase Ab SerPl-aCnc: <10 IU/mL (ref <35.0)

## 2011-11-17 ENCOUNTER — Other Ambulatory Visit: Payer: Self-pay

## 2011-12-18 ENCOUNTER — Other Ambulatory Visit: Payer: Self-pay | Admitting: Family Medicine

## 2011-12-18 NOTE — Telephone Encounter (Signed)
Last seen and filled 11/13/11.   Please advise    KP

## 2011-12-19 ENCOUNTER — Other Ambulatory Visit: Payer: Self-pay | Admitting: Family Medicine

## 2011-12-19 MED ORDER — HYDROCODONE-IBUPROFEN 7.5-200 MG PO TABS: 1.0000 | ORAL_TABLET | Freq: Four times a day (QID) | ORAL | Status: DC | PRN

## 2011-12-19 NOTE — Telephone Encounter (Signed)
Addended by: Ewing Schlein on: 12/19/2011 09:30 AM   Modules accepted: Orders

## 2011-12-19 NOTE — Telephone Encounter (Signed)
Rx printed and Faxed     KP

## 2012-01-10 ENCOUNTER — Encounter (HOSPITAL_COMMUNITY): Payer: Self-pay | Admitting: *Deleted

## 2012-01-10 ENCOUNTER — Inpatient Hospital Stay (HOSPITAL_COMMUNITY)
Admission: EM | Admit: 2012-01-10 | Discharge: 2012-01-13 | DRG: 603 | Disposition: A | Discharge status: Home / Self Care | Payer: Self-pay | Attending: Internal Medicine | Admitting: Internal Medicine

## 2012-01-10 ENCOUNTER — Emergency Department (HOSPITAL_COMMUNITY): Payer: Self-pay

## 2012-01-10 DIAGNOSIS — L03119 Cellulitis of unspecified part of limb: Secondary | ICD-10-CM | POA: Diagnosis present

## 2012-01-10 DIAGNOSIS — E1149 Type 2 diabetes mellitus with other diabetic neurological complication: Secondary | ICD-10-CM | POA: Diagnosis present

## 2012-01-10 DIAGNOSIS — IMO0001 Reserved for inherently not codable concepts without codable children: Secondary | ICD-10-CM | POA: Diagnosis present

## 2012-01-10 DIAGNOSIS — E1142 Type 2 diabetes mellitus with diabetic polyneuropathy: Secondary | ICD-10-CM | POA: Diagnosis present

## 2012-01-10 DIAGNOSIS — F329 Major depressive disorder, single episode, unspecified: Secondary | ICD-10-CM

## 2012-01-10 DIAGNOSIS — L02619 Cutaneous abscess of unspecified foot: Principal | ICD-10-CM | POA: Diagnosis present

## 2012-01-10 DIAGNOSIS — Z940 Kidney transplant status: Secondary | ICD-10-CM

## 2012-01-10 DIAGNOSIS — G629 Polyneuropathy, unspecified: Secondary | ICD-10-CM

## 2012-01-10 DIAGNOSIS — E871 Hypo-osmolality and hyponatremia: Secondary | ICD-10-CM | POA: Diagnosis present

## 2012-01-10 DIAGNOSIS — I2510 Atherosclerotic heart disease of native coronary artery without angina pectoris: Secondary | ICD-10-CM

## 2012-01-10 DIAGNOSIS — I10 Essential (primary) hypertension: Secondary | ICD-10-CM | POA: Diagnosis present

## 2012-01-10 DIAGNOSIS — E119 Type 2 diabetes mellitus without complications: Secondary | ICD-10-CM

## 2012-01-10 DIAGNOSIS — K7460 Unspecified cirrhosis of liver: Secondary | ICD-10-CM | POA: Diagnosis present

## 2012-01-10 DIAGNOSIS — M797 Fibromyalgia: Secondary | ICD-10-CM

## 2012-01-10 DIAGNOSIS — E785 Hyperlipidemia, unspecified: Secondary | ICD-10-CM | POA: Diagnosis present

## 2012-01-10 LAB — DIFFERENTIAL
Basophils Relative: 1 % (ref 0–1)
Lymphocytes Relative: 30 % (ref 12–46)
Lymphs Abs: 2.4 10*3/uL (ref 0.7–4.0)
Monocytes Absolute: 0.7 10*3/uL (ref 0.1–1.0)
Monocytes Relative: 8 % (ref 3–12)
Neutro Abs: 4.9 10*3/uL (ref 1.7–7.7)
Neutrophils Relative %: 61 % (ref 43–77)

## 2012-01-10 LAB — CBC
HCT: 40.3 % (ref 36.0–46.0)
Hemoglobin: 14.4 g/dL (ref 12.0–15.0)
MCHC: 35.7 g/dL (ref 30.0–36.0)
RBC: 5.06 MIL/uL (ref 3.87–5.11)
WBC: 8.1 10*3/uL (ref 4.0–10.5)

## 2012-01-10 MED ORDER — FENTANYL CITRATE 0.05 MG/ML IJ SOLN
50.0000 ug | Freq: Once | INTRAMUSCULAR | Status: AC
  Administered 2012-01-10: 50 ug via INTRAVENOUS
  Filled 2012-01-10: qty 2

## 2012-01-10 NOTE — ED Notes (Addendum)
Pt in c/o pain to bottom of left foot for several weeks, raised areas noted to bottom of foot, no open wounds

## 2012-01-11 ENCOUNTER — Encounter (HOSPITAL_COMMUNITY): Payer: Self-pay | Admitting: Family Medicine

## 2012-01-11 ENCOUNTER — Emergency Department (HOSPITAL_COMMUNITY): Payer: Self-pay

## 2012-01-11 DIAGNOSIS — I2510 Atherosclerotic heart disease of native coronary artery without angina pectoris: Secondary | ICD-10-CM

## 2012-01-11 DIAGNOSIS — L02619 Cutaneous abscess of unspecified foot: Secondary | ICD-10-CM | POA: Diagnosis present

## 2012-01-11 DIAGNOSIS — M797 Fibromyalgia: Secondary | ICD-10-CM

## 2012-01-11 HISTORY — DX: Atherosclerotic heart disease of native coronary artery without angina pectoris: I25.10

## 2012-01-11 LAB — BASIC METABOLIC PANEL
BUN: 13 mg/dL (ref 6–23)
CO2: 26 mEq/L (ref 19–32)
Chloride: 94 mEq/L — ABNORMAL LOW (ref 96–112)
Creatinine, Ser: 0.85 mg/dL (ref 0.50–1.10)
GFR calc Af Amer: 88 mL/min — ABNORMAL LOW (ref >90)
Glucose, Bld: 531 mg/dL — ABNORMAL HIGH (ref 70–99)
Potassium: 4 mEq/L (ref 3.5–5.1)

## 2012-01-11 LAB — GLUCOSE, CAPILLARY
Glucose-Capillary: 409 mg/dL — ABNORMAL HIGH (ref 70–99)
Glucose-Capillary: 367 mg/dL — ABNORMAL HIGH (ref 70–99)
Glucose-Capillary: 257 mg/dL — ABNORMAL HIGH (ref 70–99)

## 2012-01-11 LAB — SEDIMENTATION RATE: Sed Rate: 15 mm/h (ref 0–22)

## 2012-01-11 MED ORDER — VANCOMYCIN HCL IN DEXTROSE 1-5 GM/200ML-% IV SOLN
1000.0000 mg | Freq: Once | INTRAVENOUS | Status: AC
  Administered 2012-01-11: 1000 mg via INTRAVENOUS
  Filled 2012-01-11: qty 200

## 2012-01-11 MED ORDER — IOHEXOL 300 MG/ML  SOLN
100.0000 mL | Freq: Once | INTRAMUSCULAR | Status: AC | PRN
  Administered 2012-01-11: 100 mL via INTRAVENOUS

## 2012-01-11 MED ORDER — DEXTROSE 5 % IV SOLN
1.0000 g | INTRAVENOUS | Status: DC
  Administered 2012-01-11: 1 g via INTRAVENOUS
  Filled 2012-01-11 (×2): qty 10

## 2012-01-11 MED ORDER — CIPROFLOXACIN IN D5W 400 MG/200ML IV SOLN
400.0000 mg | Freq: Two times a day (BID) | INTRAVENOUS | Status: DC
  Administered 2012-01-11 – 2012-01-12 (×3): 400 mg via INTRAVENOUS
  Filled 2012-01-11 (×6): qty 200

## 2012-01-11 MED ORDER — HYDROCODONE-IBUPROFEN 7.5-200 MG PO TABS: 1.0000 | ORAL_TABLET | Freq: Four times a day (QID) | ORAL | Status: DC | PRN

## 2012-01-11 MED ORDER — INSULIN ASPART 100 UNIT/ML ~~LOC~~ SOLN
20.0000 [IU] | Freq: Once | SUBCUTANEOUS | Status: AC
  Administered 2012-01-11: 20 [IU] via SUBCUTANEOUS

## 2012-01-11 MED ORDER — HYDROCODONE-ACETAMINOPHEN 5-325 MG PO TABS
1.0000 | ORAL_TABLET | ORAL | Status: DC | PRN
  Administered 2012-01-11 – 2012-01-13 (×7): 2 via ORAL
  Filled 2012-01-11 (×7): qty 2

## 2012-01-11 MED ORDER — ESCITALOPRAM OXALATE 10 MG PO TABS
10.0000 mg | ORAL_TABLET | Freq: Every day | ORAL | Status: DC
  Administered 2012-01-11 – 2012-01-13 (×3): 10 mg via ORAL
  Filled 2012-01-11 (×3): qty 1

## 2012-01-11 MED ORDER — VANCOMYCIN HCL 1000 MG IV SOLR
750.0000 mg | Freq: Two times a day (BID) | INTRAVENOUS | Status: DC
  Administered 2012-01-11 – 2012-01-12 (×2): 750 mg via INTRAVENOUS
  Filled 2012-01-11 (×4): qty 750

## 2012-01-11 MED ORDER — INSULIN ASPART PROT & ASPART (70-30 MIX) 100 UNIT/ML ~~LOC~~ SUSP
35.0000 [IU] | Freq: Every day | SUBCUTANEOUS | Status: DC
  Administered 2012-01-11: 35 [IU] via SUBCUTANEOUS

## 2012-01-11 MED ORDER — DULOXETINE HCL 60 MG PO CPEP
60.0000 mg | ORAL_CAPSULE | Freq: Every day | ORAL | Status: DC
  Administered 2012-01-11 – 2012-01-13 (×3): 60 mg via ORAL
  Filled 2012-01-11 (×3): qty 1

## 2012-01-11 MED ORDER — SODIUM CHLORIDE 0.9 % IJ SOLN: 3.0000 mL | INTRAMUSCULAR | Status: DC | PRN

## 2012-01-11 MED ORDER — LISINOPRIL 5 MG PO TABS
5.0000 mg | ORAL_TABLET | Freq: Every day | ORAL | Status: DC
  Administered 2012-01-11 – 2012-01-13 (×3): 5 mg via ORAL
  Filled 2012-01-11 (×3): qty 1

## 2012-01-11 MED ORDER — INSULIN ASPART 100 UNIT/ML ~~LOC~~ SOLN: 0.0000 [IU] | Freq: Every day | SUBCUTANEOUS | Status: DC

## 2012-01-11 MED ORDER — MORPHINE SULFATE 2 MG/ML IJ SOLN
2.0000 mg | INTRAMUSCULAR | Status: DC | PRN
  Administered 2012-01-11 (×2): 2 mg via INTRAVENOUS
  Filled 2012-01-11 (×2): qty 1

## 2012-01-11 MED ORDER — GABAPENTIN 300 MG PO CAPS
600.0000 mg | ORAL_CAPSULE | Freq: Three times a day (TID) | ORAL | Status: DC
  Administered 2012-01-11 – 2012-01-13 (×7): 600 mg via ORAL
  Filled 2012-01-11 (×9): qty 2

## 2012-01-11 MED ORDER — ENOXAPARIN SODIUM 40 MG/0.4ML ~~LOC~~ SOLN
40.0000 mg | SUBCUTANEOUS | Status: DC
  Administered 2012-01-11 – 2012-01-13 (×3): 40 mg via SUBCUTANEOUS
  Filled 2012-01-11 (×3): qty 0.4

## 2012-01-11 MED ORDER — INSULIN GLARGINE 100 UNIT/ML ~~LOC~~ SOLN: 20.0000 [IU] | Freq: Every day | SUBCUTANEOUS | Status: DC

## 2012-01-11 MED ORDER — LIDOCAINE HCL (PF) 1 % IJ SOLN
10.0000 mL | Freq: Once | INTRAMUSCULAR | Status: DC
  Filled 2012-01-11: qty 10

## 2012-01-11 MED ORDER — FENTANYL CITRATE 0.05 MG/ML IJ SOLN
50.0000 ug | Freq: Once | INTRAMUSCULAR | Status: AC
  Administered 2012-01-11: 50 ug via INTRAVENOUS
  Filled 2012-01-11: qty 2

## 2012-01-11 MED ORDER — INSULIN ASPART 100 UNIT/ML ~~LOC~~ SOLN
0.0000 [IU] | Freq: Three times a day (TID) | SUBCUTANEOUS | Status: DC
  Administered 2012-01-11: 5 [IU] via SUBCUTANEOUS
  Administered 2012-01-11: 11 [IU] via SUBCUTANEOUS
  Administered 2012-01-11: 8 [IU] via SUBCUTANEOUS
  Administered 2012-01-12: 5 [IU] via SUBCUTANEOUS
  Administered 2012-01-12 (×2): 8 [IU] via SUBCUTANEOUS
  Administered 2012-01-13: 5 [IU] via SUBCUTANEOUS
  Administered 2012-01-13: 11 [IU] via SUBCUTANEOUS

## 2012-01-11 MED ORDER — INSULIN ASPART PROT & ASPART (70-30 MIX) 100 UNIT/ML ~~LOC~~ SUSP
40.0000 [IU] | Freq: Every day | SUBCUTANEOUS | Status: DC
  Administered 2012-01-11 – 2012-01-13 (×3): 40 [IU] via SUBCUTANEOUS
  Filled 2012-01-11: qty 10

## 2012-01-11 NOTE — ED Provider Notes (Addendum)
History     CSN: [id number]  Arrival date & time 01/10/12  2222   First MD Initiated Contact with Patient 01/10/12 2241      Chief Complaint  Patient presents with  . Foot Pain    (Consider location/radiation/quality/duration/timing/severity/associated sxs/prior treatment) HPI Comments: At 3-4 weeks of left foot pain started on the bottom of her foot.  She denies any trauma stepping on any foreign bodies.  She has seen Dr. Horace Porteous for this who promised to refer her to a foot specialist in the last 3-4 days.  She has noticed increasing pain in her sugars have increased above her normal 2-250 she again called Dr. Horace Porteous for referral and has not heard back.  She noticed swelling, redness, extreme pain with movement of the second and third toes or stepping on her foot.  She had a friend come look at the bottom of her foot.  Today he noticed that there was a "blue area".  I do not see a blue area, but I do see a pocket of purulent material directly under the thick callused skin just medial to the ball of the foot  The history is provided by the patient.    Past Medical History  Diagnosis Date  . Hypertension   . Fibromyalgia   . Diabetes mellitus   . High cholesterol     Past Surgical History  Procedure Date  . Abdominal hysterectomy   . Cholecystectomy   . Cesarean section   . Oophorectomy     Family History  Problem Relation Age of Onset  . Kidney disease Brother   . Diabetes Brother   . Kidney failure Brother   . Heart disease Brother 16  . Leukemia Father   . Heart disease Father   . Diabetes Father   . Colitis Father   . Crohn's disease Father   . Cancer Father     leukemia  . Diabetes Mother   . Hypertension Mother   . Mental illness Mother   . Irritable bowel syndrome Daughter     History  Substance Use Topics  . Smoking status: Never Smoker   . Smokeless tobacco: Never Used  . Alcohol Use: Yes     rare    OB History    Grav Para Term Preterm Abortions  TAB SAB Ect Mult Living                  Review of Systems  Constitutional: Negative for fever and chills.  Respiratory: Negative for shortness of breath.   Cardiovascular: Negative for leg swelling.  Neurological: Negative for dizziness and weakness.    Allergies  Penicillins  Home Medications   Current Outpatient Rx  Name Route Sig Dispense Refill  . CALCIUM CARBONATE-VITAMIN D 600-400 MG-UNIT PO TABS Oral Take 1 tablet by mouth daily.      [name] Kitchen CASANTHRANOL-DOCUSATE SODIUM 30-100 MG PO CAPS Oral Take by mouth.      [name] Kitchen VITAMIN D 2000 UNITS PO TABS Oral Take 2,000 Units by mouth daily.    . DULOXETINE HCL 60 MG PO CPEP Oral Take 1 capsule (60 mg total) by mouth daily. 30 capsule 5  . ESCITALOPRAM OXALATE 10 MG PO TABS Oral Take 10 mg by mouth daily.      [name] Kitchen GABAPENTIN 300 MG PO CAPS Oral Take 2 capsules (600 mg total) by mouth 3 (three) times daily. 180 capsule 5  . HYDROCODONE-IBUPROFEN 7.5-200 MG PO TABS Oral Take 1 tablet by mouth every 6 (  six) hours as needed for pain. 60 tablet 0  . INSULIN ISOPHANE & REGULAR (70-30) 100 UNIT/ML [place] SUSP Subcutaneous Inject 35-40 Units into the skin See admin instructions. 40 units in the morning and 35 units in the evening    . LISINOPRIL 5 MG PO TABS Oral Take 1 tablet (5 mg total) by mouth daily. 90 tablet 3  . ONE-DAILY MULTI VITAMINS PO TABS Oral Take 1 tablet by mouth daily.      [name] Kitchen POLYETHYLENE GLYCOL 3350 PO PACK Oral Take 17 g by mouth daily.      [name] Kitchen VITAMIN C 500 MG PO TABS Oral Take 500 mg by mouth daily.      BP 138/86  Pulse 107  Temp(Src) 98.6 F (37 C) (Oral)  Resp 18  SpO2 99%  Physical Exam  Constitutional: She is oriented to person, place, and time. She appears well-developed and well-nourished.  Eyes: Pupils are equal, round, and reactive to light.  Neck: Normal range of motion.  Cardiovascular: Normal rate.   Pulmonary/Chest: She is in respiratory distress.  Musculoskeletal:       Bottom of the foot, just medial to the  ball of the foot underneath the second, third toe an area approximately 2 cm long by half a centimeter wide of purulent material just under the callused skin shows excruciating pain with movement of his third toe and with palpation to the middle of the foot directly beneath the area in question on the dorsum of the foot.  She has some erythema to the bases of the second and third toe.  Positive pulses.  No swelling noted on  the dorsum of the foot  Neurological: She is alert and oriented to person, place, and time.  Skin: Skin is warm.    ED Course  Procedures (including critical care time)  Labs Reviewed  BASIC METABOLIC PANEL - Abnormal; Notable for the following:    Sodium 131 (*)    Chloride 94 (*)    Glucose, Bld 531 (*)    GFR calc non Af Amer 76 (*)    GFR calc Af Amer 88 (*)    All other components within normal limits  CBC  DIFFERENTIAL  SEDIMENTATION RATE   Ct Foot Left W Contrast  01/11/2012  *RADIOLOGY REPORT*  Clinical Data: Evaluate abscess along the body of the foot.  CT OF THE LEFT FOOT WITH CONTRAST  Technique:  Multidetector CT imaging was performed following the standard protocol during bolus administration of intravenous contrast.  Contrast: 186m OMNIPAQUE IOHEXOL 300 MG/ML IJ SOLN  Comparison: 01/10/2012 radiograph  Findings: There is stranding of the subcutaneous fat along the plantar surface of the foot.  At the level of the third metatarsal head there is a 6 mm oval density along the skin surface.  There is no deep extension.  No acute osseous abnormality.  No aggressive osseous lesion. And posterior calcaneal enthesopathic changes.  Mild midfoot DJD.  IMPRESSION: Plantar surface fat stranding may reflects cellulitis.  6 mm fluid density along the skin surface, well superficial to the third metatarsal head.  Correlate with direct inspection.  There is no deep extension.  Original Report Authenticated By: ASuanne Marker M.D.   Dg Foot Complete Left  01/10/2012   *RADIOLOGY REPORT*  Clinical Data: Pain  LEFT FOOT - COMPLETE 3+ VIEW  Comparison: None.  Findings: Spurring is present at the inferior and posterior calcaneus.  No acute fracture and no dislocation.  No destructive bone lesion.  IMPRESSION:  No acute bony pathology.  Original Report Authenticated By: Jamas Lav, M.D.     1. Cellulitis and abscess of foot       MDM  Abscess of the left foot        Garald Balding, NP 01/11/12 0146  Garald Balding, NP 01/11/12 6756

## 2012-01-11 NOTE — Consult Note (Signed)
Reason for Consult:L foot diabetic infection Referring Physician: Ernesto Rutherford, MD  [name] [name] is an 56 y.o. female.  HPI: Diabetic female with 4-5 wk history of L foot pain.  Doesn't recall stepping on anything.  Over last week has developed worsening signs of infection and severe pain with a focal area of swelling on the plantar aspect of her L foot.  Pain is worse with direct pressure, better with elevation. She has been on broad spectrum abx since admission for empiric therapy.  I was consulted for possible I&D.   Past Medical History  Diagnosis Date  . Hypertension   . Fibromyalgia   . Diabetes mellitus   . High cholesterol     Past Surgical History  Procedure Date  . Abdominal hysterectomy   . Cholecystectomy   . Cesarean section   . Oophorectomy   . Transplantation renal     Family History  Problem Relation Age of Onset  . Kidney disease Brother   . Diabetes Brother   . Kidney failure Brother   . Heart disease Brother 4  . Leukemia Father   . Heart disease Father   . Diabetes Father   . Colitis Father   . Crohn's disease Father   . Cancer Father     leukemia  . Diabetes Mother   . Hypertension Mother   . Mental illness Mother   . Irritable bowel syndrome Daughter     Social History:  reports that she has never smoked. She has never used smokeless tobacco. She reports that she drinks alcohol. She reports that she does not use illicit drugs.  Allergies:  Allergies  Allergen Reactions  . Penicillins Anaphylaxis    Medications:  Scheduled:   . cefTRIAXone (ROCEPHIN)  IV  1 g Intravenous Q24H  . DULoxetine  60 mg Oral Daily  . enoxaparin  40 mg Subcutaneous Q24H  . escitalopram  10 mg Oral Daily  . fentaNYL  50 mcg Intravenous Once  . fentaNYL  50 mcg Intravenous Once  . gabapentin  600 mg Oral TID  . insulin aspart  0-15 Units Subcutaneous TID WC  . insulin aspart  0-5 Units Subcutaneous QHS  . insulin aspart  20 Units Subcutaneous Once  . insulin  aspart protamine-insulin aspart  35 Units Subcutaneous Q supper  . insulin aspart protamine-insulin aspart  40 Units Subcutaneous Q breakfast  . lisinopril  5 mg Oral Daily  . vancomycin  750 mg Intravenous Q12H  . vancomycin  1,000 mg Intravenous Once    Results for orders placed during the hospital encounter of 01/10/12 (from the past 48 hour(s))  CBC     Status: Normal   Collection Time   01/10/12 11:31 PM      Component Value Range Comment   WBC 8.1  4.0 - 10.5 (K/uL)    RBC 5.06  3.87 - 5.11 (MIL/uL)    Hemoglobin 14.4  12.0 - 15.0 (g/dL)    HCT 40.3  36.0 - 46.0 (%)    MCV 79.6  78.0 - 100.0 (fL)    MCH 28.5  26.0 - 34.0 (pg)    MCHC 35.7  30.0 - 36.0 (g/dL)    RDW 12.7  11.5 - 15.5 (%)    Platelets 165  150 - 400 (K/uL)   DIFFERENTIAL     Status: Normal   Collection Time   01/10/12 11:31 PM      Component Value Range Comment   Neutrophils Relative 61  43 - 77 (%)  Neutro Abs 4.9  1.7 - 7.7 (K/uL)    Lymphocytes Relative 30  12 - 46 (%)    Lymphs Abs 2.4  0.7 - 4.0 (K/uL)    Monocytes Relative 8  3 - 12 (%)    Monocytes Absolute 0.7  0.1 - 1.0 (K/uL)    Eosinophils Relative 1  0 - 5 (%)    Eosinophils Absolute 0.1  0.0 - 0.7 (K/uL)    Basophils Relative 1  0 - 1 (%)    Basophils Absolute 0.0  0.0 - 0.1 (K/uL)   BASIC METABOLIC PANEL     Status: Abnormal   Collection Time   01/10/12 11:31 PM      Component Value Range Comment   Sodium 131 (*) 135 - 145 (mEq/L)    Potassium 4.0  3.5 - 5.1 (mEq/L)    Chloride 94 (*) 96 - 112 (mEq/L)    CO2 26  19 - 32 (mEq/L)    Glucose, Bld 531 (*) 70 - 99 (mg/dL)    BUN 13  6 - 23 (mg/dL)    Creatinine, Ser 0.85  0.50 - 1.10 (mg/dL)    Calcium 9.7  8.4 - 10.5 (mg/dL)    GFR calc non Af Amer 76 (*) >90 (mL/min)    GFR calc Af Amer 88 (*) >90 (mL/min)   SEDIMENTATION RATE     Status: Normal   Collection Time   01/10/12 11:31 PM      Component Value Range Comment   Sed Rate 15  0 - 22 (mm/hr)   GLUCOSE, CAPILLARY     Status:  Abnormal   Collection Time   01/11/12  6:17 AM      Component Value Range Comment   Glucose-Capillary 409 (*) 70 - 99 (mg/dL)     Ct Foot Left W Contrast  01/11/2012  *RADIOLOGY REPORT*  Clinical Data: Evaluate abscess along the body of the foot.  CT OF THE LEFT FOOT WITH CONTRAST  Technique:  Multidetector CT imaging was performed following the standard protocol during bolus administration of intravenous contrast.  Contrast: 154m OMNIPAQUE IOHEXOL 300 MG/ML IJ SOLN  Comparison: 01/10/2012 radiograph  Findings: There is stranding of the subcutaneous fat along the plantar surface of the foot.  At the level of the third metatarsal head there is a 6 mm oval density along the skin surface.  There is no deep extension.  No acute osseous abnormality.  No aggressive osseous lesion. And posterior calcaneal enthesopathic changes.  Mild midfoot DJD.  IMPRESSION: Plantar surface fat stranding may reflects cellulitis.  6 mm fluid density along the skin surface, well superficial to the third metatarsal head.  Correlate with direct inspection.  There is no deep extension.  Original Report Authenticated By: ASuanne Marker M.D.   Dg Foot Complete Left  01/10/2012  *RADIOLOGY REPORT*  Clinical Data: Pain  LEFT FOOT - COMPLETE 3+ VIEW  Comparison: None.  Findings: Spurring is present at the inferior and posterior calcaneus.  No acute fracture and no dislocation.  No destructive bone lesion.  IMPRESSION: No acute bony pathology.  Original Report Authenticated By: AJamas Lav M.D.    Review of Systems  All other systems reviewed and are negative.   Blood pressure 131/82, pulse 100, temperature 98.6 F (37 C), temperature source Oral, resp. rate 20, height 5' 1"  (1.549 m), weight 67.132 kg (148 lb), SpO2 98.00%. Physical Exam  Constitutional: She is oriented to person, place, and time. She appears well-developed and well-nourished.  HENT:  Head: Atraumatic.  Eyes: EOM are normal.  Cardiovascular: Intact  distal pulses.   Respiratory: Effort normal.  Musculoskeletal:       L foot with mild diffuse swelling and erythema.  Focal swelling ~1x2 cm on platar aspect of foot beneath 3rd ray.  Very superficial.  Exquisitely tender to palpation.  Wiggles toes.  No apparent foreign body.  Neurological: She is alert and oriented to person, place, and time.  Skin: Skin is warm and dry.    Assessment/Plan: L foot superficial abscess in diabetic Plan for bedside I&D with local anesthetic this afternoon, this is very superficial and should not require GA. Continue empiric antibiotics Will follow.  Nita Sells 01/11/2012, 7:51 AM

## 2012-01-11 NOTE — Progress Notes (Signed)
ANTIBIOTIC CONSULT NOTE - INITIAL  Pharmacy Consult for Vancomycin Indication: Diabetic foot infection with abscess  Allergies  Allergen Reactions  . Penicillins Anaphylaxis    Patient Measurements: Height: 5' 1"  (154.9 cm) Weight: 148 lb (67.132 kg) IBW/kg (Calculated) : 47.8    Vital Signs: Temp: 98.6 F (37 C) (03/14 0613) Temp src: Oral (03/14 0613) BP: 131/82 mmHg (03/14 0613) Pulse Rate: 100  (03/14 0613) I Labs:  Basename 01/10/12 2331  WBC 8.1  HGB 14.4  PLT 165  LABCREA --  CREATININE 0.85   Estimated Creatinine Clearance: 65.5 ml/min (by C-G formula based on Cr of 0.85).    Microbiology: No results found for this or any previous visit (from the past 720 hour(s)).  Medical History: Past Medical History  Diagnosis Date  . Hypertension   . Fibromyalgia   . Diabetes mellitus   . High cholesterol     Medications:  Scheduled:    . cefTRIAXone (ROCEPHIN)  IV  1 g Intravenous Q24H  . DULoxetine  60 mg Oral Daily  . enoxaparin  40 mg Subcutaneous Q24H  . escitalopram  10 mg Oral Daily  . fentaNYL  50 mcg Intravenous Once  . fentaNYL  50 mcg Intravenous Once  . gabapentin  600 mg Oral TID  . insulin aspart  0-15 Units Subcutaneous TID WC  . insulin aspart  0-5 Units Subcutaneous QHS  . insulin aspart  20 Units Subcutaneous Once  . insulin aspart protamine-insulin aspart  35 Units Subcutaneous BID WC  . insulin aspart protamine-insulin aspart  40 Units Subcutaneous Q breakfast  . lisinopril  5 mg Oral Daily  . vancomycin  750 mg Intravenous Q12H  . vancomycin  1,000 mg Intravenous Once   Infusions:   PRN: HYDROcodone-acetaminophen, HYDROcodone-ibuprofen, iohexol, morphine, sodium chloride  Assessment:  56 y/o F with DM admitted with cellulitis and abscess L foot; MRI pending to r/o osteomyelitis.  To receive Vancomycin and Ceftriaxone as empiric therapy.  (MD aware of PCN allergy - anaphylaxis).  Received Vancomycin 1 gram today at 0420  Goal  of Therapy:  Vancomycin trough level 15-20 mcg/ml  Plan:   Vancomycin 723m IV q12h  Follow serum creatinine  Await culture results  Check trough at steady-state  RClayburn Pert PharmD, BCPS Pager: [phone]/14/2013  7:02 AM

## 2012-01-11 NOTE — Op Note (Signed)
Preoperative diagnosis:Left Foot Superficial abscess Post op Dx: Same Procedure performed: I & D superficial L foot Abscess Surgeon: Tania Ade Anesth: Local Complications: none Spec: Cxs sent EBL: min  Procedure:  Plantar aspect of the left foot was anesthetized with 1% lidocaine, 10 cc, after prepping with alcohol. The foot was then prepped with Betadine and sterilely draped. An 11 blade was used to open the superficial abscess. A few cc of pus came out. This was swabbed with a culture stick and sent to microbiology. The superficial layer of skin was epidermis only and very thin. Therefore in order to unroofed the abscess I excised this skin. There was an area of approximately 1.5 x 1 cm. I then copiously irrigated the area with normal saline using a syringe and Angiocath tip. The wound was clean and healthy-appearing after irrigation. The wound was then sterilely dressed with 4 x 4's Kerlix and an Ace bandage.  Post Procedure plan: She will continue on antibiotics per the primary team. I think she could be transitioned to oral antibiotics when she shows signs of improvement. I will plan on changing her dressing in 2 days if she is still in the hospital. If she is discharged I can see her back in my office next week for wound check.

## 2012-01-11 NOTE — Progress Notes (Signed)
Patient arrived to floor at approximately 0600.  Blood glucose was 531 at 2331 01/10/12 but had not been checked again.  CBG checked and found to be 409.  Dr. Claria Dice notified, new orders received

## 2012-01-11 NOTE — ED Provider Notes (Signed)
Medical screening examination/treatment/procedure(s) were conducted as a shared visit with non-physician practitioner(s) and myself.  I personally evaluated the patient during the encounter  Mild erythema and exquisite tenderness of soft tissue of left foot.  Wynetta Fines, MD 01/11/12 [phone]

## 2012-01-11 NOTE — H&P (Signed)
PCP:   Garnet Koyanagi, DO, DO   Chief Complaint:  Pain left foot  HPI: This is a very pleasant 56 year old female with diabetes mellitus and who states that lasted for weeks she's been having increasing pain in the sole of the left foot. It is not to the point where she is almost unable to walk on it. She denies any fevers, chills. She states that today she had some nausea but no vomiting. She did see her PCP however she has not proceed with a workup because she is uninsured. History provided by the patient.  Review of Systems: Positives bolded   anorexia, fever, weight loss,, vision loss, decreased hearing, hoarseness, chest pain, syncope, dyspnea on exertion, peripheral edema, balance deficits, hemoptysis, abdominal pain, melena, hematochezia, severe indigestion/heartburn, hematuria, incontinence, genital sores, muscle weakness, suspicious skin lesions, transient blindness, difficulty walking, depression, unusual weight change, abnormal bleeding, enlarged lymph nodes, angioedema, and breast masses.  Past Medical History: Past Medical History  Diagnosis Date  . Hypertension   . Fibromyalgia   . Diabetes mellitus   . High cholesterol    Past Surgical History  Procedure Date  . Abdominal hysterectomy   . Cholecystectomy   . Cesarean section   . Oophorectomy   . Transplantation renal     Medications: Prior to Admission medications   Medication Sig Start Date End Date Taking? Authorizing Provider  Calcium Carbonate-Vitamin D (CALTRATE 600+D) 600-400 MG-UNIT per tablet Take 1 tablet by mouth daily.     Yes Historical Provider, MD  Casanthranol-Docusate Sodium 30-100 MG CAPS Take by mouth.     Yes Historical Provider, MD  Cholecalciferol (VITAMIN D) 2000 UNITS tablet Take 2,000 Units by mouth daily.   Yes Historical Provider, MD  DULoxetine (CYMBALTA) 60 MG capsule Take 1 capsule (60 mg total) by mouth daily. 07/17/11  Yes Yvonne R Lowne, DO  escitalopram (LEXAPRO) 10 MG tablet Take 10 mg  by mouth daily.     Yes Historical Provider, MD  gabapentin (NEURONTIN) 300 MG capsule Take 2 capsules (600 mg total) by mouth 3 (three) times daily. 07/17/11  Yes Yvonne R Lowne, DO  HYDROcodone-ibuprofen (VICOPROFEN) 7.5-200 MG per tablet Take 1 tablet by mouth every 6 (six) hours as needed for pain. 12/19/11  Yes Yvonne R Lowne, DO  insulin NPH-insulin regular (NOVOLIN 70/30) (70-30) 100 UNIT/ML injection Inject 35-40 Units into the skin See admin instructions. 40 units in the morning and 35 units in the evening   Yes Historical Provider, MD  lisinopril (PRINIVIL,ZESTRIL) 5 MG tablet Take 1 tablet (5 mg total) by mouth daily. 11/13/11  Yes Rosalita Chessman, DO  Multiple Vitamin (MULTIVITAMIN) tablet Take 1 tablet by mouth daily.     Yes Historical Provider, MD  polyethylene glycol (MIRALAX / GLYCOLAX) packet Take 17 g by mouth daily.     Yes Historical Provider, MD  vitamin C (ASCORBIC ACID) 500 MG tablet Take 500 mg by mouth daily.   Yes Historical Provider, MD    Allergies:   Allergies  Allergen Reactions  . Penicillins Anaphylaxis    Social History:  reports that she has never smoked. She has never used smokeless tobacco. She reports that she drinks alcohol. She reports that she does not use illicit drugs.  Family History: Family History  Problem Relation Age of Onset  . Kidney disease Brother   . Diabetes Brother   . Kidney failure Brother   . Heart disease Brother 65  . Leukemia Father   . Heart disease  Father   . Diabetes Father   . Colitis Father   . Crohn's disease Father   . Cancer Father     leukemia  . Diabetes Mother   . Hypertension Mother   . Mental illness Mother   . Irritable bowel syndrome Daughter     Physical Exam: Filed Vitals:   01/10/12 2234 01/11/12 0445  BP: 138/86 112/68  Pulse: 107 97  Temp: 98.6 F (37 C) 98.4 F (36.9 C)  TempSrc: Oral Oral  Resp: 18 20  SpO2: 99% 98%    General:  Alert and oriented times three, well developed and  nourished, no acute distress Eyes: PERRLA, pink conjunctiva, no scleral icterus ENT: Moist oral mucosa, neck supple, no thyromegaly Lungs: clear to ascultation, no wheeze, no crackles, no use of accessory muscles Cardiovascular: regular rate and rhythm, no regurgitation, no gallops, no murmurs. No carotid bruits, no JVD Abdomen: soft, positive BS, non-tender, non-distended, no organomegaly, not an acute abdomen GU: not examined Neuro: CN II - XII grossly intact, sensation intact Musculoskeletal: strength 5/5 all extremities, no clubbing, cyanosis or edema, there is a outpocketing that is soft and in very tender on the plantar service of the sole of the left foot Skin: no rash, no subcutaneous crepitation, no decubitus Psych: appropriate patient   Labs on Admission:   [facility] 01/10/12 2331  NA 131*  K 4.0  CL 94*  CO2 26  GLUCOSE 531*  BUN 13  CREATININE 0.85  CALCIUM 9.7  MG --  PHOS --   No results found for this basename: AST:2,ALT:2,ALKPHOS:2,BILITOT:2,PROT:2,ALBUMIN:2 in the last 72 hours No results found for this basename: LIPASE:2,AMYLASE:2 in the last 72 hours  Basename 01/10/12 2331  WBC 8.1  NEUTROABS 4.9  HGB 14.4  HCT 40.3  MCV 79.6  PLT 165   No results found for this basename: CKTOTAL:3,CKMB:3,CKMBINDEX:3,TROPONINI:3 in the last 72 hours No components found with this basename: POCBNP:3 No results found for this basename: DDIMER:2 in the last 72 hours No results found for this basename: HGBA1C:2 in the last 72 hours No results found for this basename: CHOL:2,HDL:2,LDLCALC:2,TRIG:2,CHOLHDL:2,LDLDIRECT:2 in the last 72 hours No results found for this basename: TSH,T4TOTAL,FREET3,T3FREE,THYROIDAB in the last 72 hours No results found for this basename: VITAMINB12:2,FOLATE:2,FERRITIN:2,TIBC:2,IRON:2,RETICCTPCT:2 in the last 72 hours  Micro Results: No results found for this or any previous visit (from the past 240 hour(s)).   Radiological Exams on  Admission: Ct Foot Left W Contrast  01/11/2012  *RADIOLOGY REPORT*  Clinical Data: Evaluate abscess along the body of the foot.  CT OF THE LEFT FOOT WITH CONTRAST  Technique:  Multidetector CT imaging was performed following the standard protocol during bolus administration of intravenous contrast.  Contrast: 143m OMNIPAQUE IOHEXOL 300 MG/ML IJ SOLN  Comparison: 01/10/2012 radiograph  Findings: There is stranding of the subcutaneous fat along the plantar surface of the foot.  At the level of the third metatarsal head there is a 6 mm oval density along the skin surface.  There is no deep extension.  No acute osseous abnormality.  No aggressive osseous lesion. And posterior calcaneal enthesopathic changes.  Mild midfoot DJD.  IMPRESSION: Plantar surface fat stranding may reflects cellulitis.  6 mm fluid density along the skin surface, well superficial to the third metatarsal head.  Correlate with direct inspection.  There is no deep extension.  Original Report Authenticated By: ASuanne Marker M.D.   Dg Foot Complete Left  01/10/2012  *RADIOLOGY REPORT*  Clinical Data: Pain  LEFT FOOT -  COMPLETE 3+ VIEW  Comparison: None.  Findings: Spurring is present at the inferior and posterior calcaneus.  No acute fracture and no dislocation.  No destructive bone lesion.  IMPRESSION: No acute bony pathology.  Original Report Authenticated By: Jamas Lav, M.D.    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 She does  not remember the names or the dosages of any of her medications, therefore she will have the bottles brought in. None of these medications are currently started.  Full code  DVT prophylaxis Team 5/Dr. Durwin Nora, Marcille Barman 01/11/2012, 5:16 AM

## 2012-01-11 NOTE — Progress Notes (Signed)
Subjective:  left foot Pain better. No others complaints.  Objective: Filed Vitals:   01/10/12 2234 01/11/12 0445 01/11/12 0543 01/11/12 0613  BP: 138/86 112/68 125/76 131/82  Pulse: 107 97 96 100  Temp: 98.6 F (37 C) 98.4 F (36.9 C) 98.1 F (36.7 C) 98.6 F (37 C)  TempSrc: Oral Oral Oral Oral  Resp: 18 20  20   Height:    5' 1"  (1.549 m)  Weight:    67.132 kg (148 lb)  SpO2: 99% 98% 96% 98%   Weight change:  No intake or output data in the 24 hours ending 01/11/12 1010  General: Alert, awake, oriented x3, in no acute distress.  HEENT: No bruits, no goiter.  Heart: Regular rate and rhythm, without murmurs, rubs, gallops.  Lungs: Crackles left side, bilateral air movement.  Abdomen: Soft, nontender, nondistended, positive bowel sounds.  Neuro: Grossly intact, nonfocal. Left foot with swelling, outpocketing soft , tender plantar aspect.   Lab Results:  [facility] 01/10/12 2331  NA 131*  K 4.0  CL 94*  CO2 26  GLUCOSE 531*  BUN 13  CREATININE 0.85  CALCIUM 9.7  MG --  PHOS --    Basename 01/10/12 2331  WBC 8.1  NEUTROABS 4.9  HGB 14.4  HCT 40.3  MCV 79.6  PLT 165    Micro Results: No results found for this or any previous visit (from the past 240 hour(s)).  Studies/Results: Ct Foot Left W Contrast  01/11/2012  *RADIOLOGY REPORT*  Clinical Data: Evaluate abscess along the body of the foot.  CT OF THE LEFT FOOT WITH CONTRAST  Technique:  Multidetector CT imaging was performed following the standard protocol during bolus administration of intravenous contrast.  Contrast: 155m OMNIPAQUE IOHEXOL 300 MG/ML IJ SOLN  Comparison: 01/10/2012 radiograph  Findings: There is stranding of the subcutaneous fat along the plantar surface of the foot.  At the level of the third metatarsal head there is a 6 mm oval density along the skin surface.  There is no deep extension.  No acute osseous abnormality.  No aggressive osseous lesion. And posterior calcaneal enthesopathic  changes.  Mild midfoot DJD.  IMPRESSION: Plantar surface fat stranding may reflects cellulitis.  6 mm fluid density along the skin surface, well superficial to the third metatarsal head.  Correlate with direct inspection.  There is no deep extension.  Original Report Authenticated By: ASuanne Marker M.D.   Dg Foot Complete Left  01/10/2012  *RADIOLOGY REPORT*  Clinical Data: Pain  LEFT FOOT - COMPLETE 3+ VIEW  Comparison: None.  Findings: Spurring is present at the inferior and posterior calcaneus.  No acute fracture and no dislocation.  No destructive bone lesion.  IMPRESSION: No acute bony pathology.  Original Report Authenticated By: AJamas Lav M.D.    Medications: I have reviewed the patient's current medications.   Patient Active Hospital Problem List:  Cellulitis and abscess of foot (01/11/2012) I and D today. Continue with IV vancomycin and I will start ciprofloxacin. I will discontinue Ceftriaxone.   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 She still take medications every 3 days. She took her medications today, her husband brought it to her. I advised her to give uK[place] list of medications.   DIABETES MELLITUS, TYPE II (12/23/2006) Continue with SSI, and 70/30.   HYPERLIPIDEMIA (06/03/2008)  HYPERTENSION (12/23/2006)  Continue with lisinopril. Hyponatremia/Pseudo hyponatremia: Continue with IV fluids.  Cirrhosis of liver without mention of alcohol (07/06/2010) Fibromyalgia (  01/11/2012) CAD (coronary artery disease) (01/11/2012)      LOS: 1 day   Malakie Balis M.D.  [facility] 01/11/2012, 10:10 AM

## 2012-01-11 NOTE — Progress Notes (Signed)
UR CHART REVIEWED; B Michaeljohn Biss RN, BSN, MHA

## 2012-01-12 LAB — CBC
Hemoglobin: 13 g/dL (ref 12.0–15.0)
MCH: 28 pg (ref 26.0–34.0)
Platelets: 134 10*3/uL — ABNORMAL LOW (ref 150–400)
RBC: 4.64 MIL/uL (ref 3.87–5.11)
WBC: 4.7 10*3/uL (ref 4.0–10.5)

## 2012-01-12 LAB — BASIC METABOLIC PANEL
CO2: 29 mEq/L (ref 19–32)
Calcium: 8.7 mg/dL (ref 8.4–10.5)
Chloride: 100 mEq/L (ref 96–112)
Glucose, Bld: 256 mg/dL — ABNORMAL HIGH (ref 70–99)
Potassium: 3.7 mEq/L (ref 3.5–5.1)
Sodium: 134 mEq/L — ABNORMAL LOW (ref 135–145)

## 2012-01-12 LAB — GLUCOSE, CAPILLARY
Glucose-Capillary: 227 mg/dL — ABNORMAL HIGH (ref 70–99)
Glucose-Capillary: 291 mg/dL — ABNORMAL HIGH (ref 70–99)
Glucose-Capillary: 281 mg/dL — ABNORMAL HIGH (ref 70–99)

## 2012-01-12 MED ORDER — CIPROFLOXACIN HCL 500 MG PO TABS
500.0000 mg | ORAL_TABLET | Freq: Two times a day (BID) | ORAL | Status: DC
  Administered 2012-01-12 – 2012-01-13 (×2): 500 mg via ORAL
  Filled 2012-01-12 (×3): qty 1

## 2012-01-12 MED ORDER — INSULIN ASPART PROT & ASPART (70-30 MIX) 100 UNIT/ML ~~LOC~~ SUSP
40.0000 [IU] | Freq: Every day | SUBCUTANEOUS | Status: DC
  Administered 2012-01-12: 40 [IU] via SUBCUTANEOUS

## 2012-01-12 MED ORDER — SULFAMETHOXAZOLE-TRIMETHOPRIM 400-80 MG PO TABS
1.0000 | ORAL_TABLET | Freq: Two times a day (BID) | ORAL | Status: DC
  Administered 2012-01-12 – 2012-01-13 (×3): 1 via ORAL
  Filled 2012-01-12 (×4): qty 1

## 2012-01-12 NOTE — Progress Notes (Signed)
Patient with minimal pain L foot  Dressing removed Wound appears benign, no redness or drainage  Day #2 after L foot I and D  - abx per primary team - dressing changes qd if drainage occurs - f/u with Dr. [name] or PCP 7-10 days

## 2012-01-12 NOTE — Progress Notes (Signed)
Results for YAREXI, PAWLICKI (MRN [id number]) as of 01/12/2012 11:35  Ref. Range 01/11/2012 08:10 01/11/2012 11:57 01/11/2012 15:59 01/11/2012 21:18  Glucose-Capillary Latest Range: 70-99 mg/dL 367 (H) 249 (H) 254 (H) 257 (H)   Please increase patient's 70/30 insulin: Increase AM 70/30 to 45 units Increase PM 70/30 to 40 units  Will follow. Wyn Quaker RN, MSN, CDE Diabetes Coordinator Inpatient Diabetes Program [phone]

## 2012-01-12 NOTE — Progress Notes (Signed)
[name]  MRN: [id number]  DOB/AGE:  1956/04/05 / 56 y.o.       Subjective: Pain is moderate over plantar foot at site of I&D.  No c/o chest pain or SOB.      Objective: Vital signs in last 24 hours: Temp:  [98 F (36.7 C)-98.5 F (36.9 C)] 98 F (36.7 C) (03/15 0552) Pulse Rate:  [71-97] 71  (03/15 0552) Resp:  [16-18] 16  (03/15 0552) BP: (113-133)/(75-86) 119/81 mmHg (03/15 0552) SpO2:  [96 %-97 %] 96 % (03/15 0552)  Intake/Output from previous day: 03/14 0701 - 03/15 0700 In: 1660 [P.O.:1260; IV Piggyback:400] Out: -  Intake/Output this shift:     Basename 01/12/12 0425 01/10/12 2331  HGB 13.0 14.4    Basename 01/12/12 0425 01/10/12 2331  WBC 4.7 8.1  RBC 4.64 5.06  HCT 37.9 40.3  PLT 134* 165    Basename 01/12/12 0425 01/10/12 2331  NA 134* 131*  K 3.7 4.0  CL 100 94*  CO2 29 26  BUN 12 13  CREATININE 0.73 0.85  GLUCOSE 256* 531*  CALCIUM 8.7 9.7   No results found for this basename: LABPT:2,INR:2 in the last 72 hours  Physical Exam: Incision: dressing C/D/I Wiggles toes, no distal erythema  Assessment/Plan:     Continue abx per primary team.  Could transition to outpatient oral abx when appropriate Will change dressing tomorrow if still inpt.  If d/c'd should start daily dressing changes. Can f/u in my office or PCP in  ~7-10 days to look at wound.  No sutures.    Nita Sells 01/12/2012, 8:09 AM

## 2012-01-12 NOTE — Progress Notes (Signed)
Subjective: Patient feeling better. Mild foot pain. She spoke with her nephrologist she is not on imnune suppressive therapy.  Objective: Filed Vitals:   01/11/12 1440 01/11/12 2115 01/12/12 0552 01/12/12 1022  BP: 113/75 133/86 119/81 128/72  Pulse: 97 82 71   Temp: 98.5 F (36.9 C) 98.3 F (36.8 C) 98 F (36.7 C)   TempSrc: Oral Oral Oral   Resp: 18 18 16    Height:      Weight:      SpO2: 96% 97% 96%    Weight change:   Intake/Output Summary (Last 24 hours) at 01/12/12 1236 Last data filed at 01/11/12 1714  Gross per 24 hour  Intake   1420 ml  Output      0 ml  Net   1420 ml    General: Alert, awake, oriented x3, in no acute distress.  HEENT: No bruits, no goiter.  Heart: Regular rate and rhythm, without murmurs, rubs, gallops.  Lungs: Crackles left side, bilateral air movement.  Abdomen: Soft, nontender, nondistended, positive bowel sounds.  Neuro: Grossly intact, nonfocal. Extremities. Right foot with dressing clean.   Lab Results:  [facility] 01/12/12 0425 01/10/12 2331  NA 134* 131*  K 3.7 4.0  CL 100 94*  CO2 29 26  GLUCOSE 256* 531*  BUN 12 13  CREATININE 0.73 0.85  CALCIUM 8.7 9.7  MG -- --  PHOS -- --    Basename 01/12/12 0425 01/10/12 2331  WBC 4.7 8.1  NEUTROABS -- 4.9  HGB 13.0 14.4  HCT 37.9 40.3  MCV 81.7 79.6  PLT 134* 165    Micro Results: Recent Results (from the past 240 hour(s))  WOUND CULTURE     Status: Normal (Preliminary result)   Collection Time   01/11/12  3:29 PM      Component Value Range Status Comment   Specimen Description FOOT   Final    Special Requests Immunocompromised   Final    Gram Stain     Final    Value: NO WBC SEEN     RARE SQUAMOUS EPITHELIAL CELLS PRESENT     RARE GRAM POSITIVE COCCI     IN PAIRS   Culture PENDING   Incomplete    Report Status PENDING   Incomplete     Studies/Results: Ct Foot Left W Contrast  01/11/2012  *RADIOLOGY REPORT*  Clinical Data: Evaluate abscess along the body of the  foot.  CT OF THE LEFT FOOT WITH CONTRAST  Technique:  Multidetector CT imaging was performed following the standard protocol during bolus administration of intravenous contrast.  Contrast: 19m OMNIPAQUE IOHEXOL 300 MG/ML IJ SOLN  Comparison: 01/10/2012 radiograph  Findings: There is stranding of the subcutaneous fat along the plantar surface of the foot.  At the level of the third metatarsal head there is a 6 mm oval density along the skin surface.  There is no deep extension.  No acute osseous abnormality.  No aggressive osseous lesion. And posterior calcaneal enthesopathic changes.  Mild midfoot DJD.  IMPRESSION: Plantar surface fat stranding may reflects cellulitis.  6 mm fluid density along the skin surface, well superficial to the third metatarsal head.  Correlate with direct inspection.  There is no deep extension.  Original Report Authenticated By: ASuanne Marker M.D.   Dg Foot Complete Left  01/10/2012  *RADIOLOGY REPORT*  Clinical Data: Pain  LEFT FOOT - COMPLETE 3+ VIEW  Comparison: None.  Findings: Spurring is present at the inferior and posterior calcaneus.  No acute  fracture and no dislocation.  No destructive bone lesion.  IMPRESSION: No acute bony pathology.  Original Report Authenticated By: Jamas Lav, M.D.    Medications: I have reviewed the patient's current medications.  Cellulitis and abscess of foot (01/11/2012) I and D on 3-14.[name] [name]  Recieved vancomycin and IV ciprofloxacin for 2 days. I will discontinue Ceftriaxone.  I will start oral cipro and bactrim. Follow culture result.  Probably DC tomorrow.  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  She is not on immune suppressive medications.   DIABETES MELLITUS, TYPE II (12/23/2006) Continue with SSI, and 70/30. Increase night dose to 40 units.   HYPERLIPIDEMIA (06/03/2008)  HYPERTENSION (12/23/2006) Continue with lisinopril.  Hyponatremia/Pseudo hyponatremia: Continue with IV fluids.    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  [facility] 01/12/2012, 12:36 PM

## 2012-01-13 LAB — GLUCOSE, CAPILLARY: Glucose-Capillary: 234 mg/dL — ABNORMAL HIGH (ref 70–99)

## 2012-01-13 MED ORDER — HYDROCODONE-ACETAMINOPHEN 5-325 MG PO TABS: 1.0000 | ORAL_TABLET | ORAL | Status: DC | PRN

## 2012-01-13 MED ORDER — SULFAMETHOXAZOLE-TRIMETHOPRIM 400-80 MG PO TABS: 1.0000 | ORAL_TABLET | Freq: Two times a day (BID) | ORAL | Status: AC

## 2012-01-13 MED ORDER — CIPROFLOXACIN HCL 500 MG PO TABS: 500.0000 mg | ORAL_TABLET | Freq: Two times a day (BID) | ORAL | Status: DC

## 2012-01-13 NOTE — Discharge Summary (Signed)
Admit date: 01/10/2012 Discharge date: 01/13/2012  Primary Care Physician:  Garnet Koyanagi, DO, DO   Discharge Diagnoses:    . Cellulitis and abscess of left foot Hyponatremia 01/11/2012   . Fibromyalgia 01/11/2012   . CAD (coronary artery disease) 01/11/2012   . Cirrhosis of liver without mention of alcohol 07/06/2010   . 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 HYPERLIPIDEMIA 06/03/2008   . DIABETES MELLITUS, TYPE II 12/23/2006   . HYPERTENSION 12/23/2006              DISCHARGE MEDICATION: Medication List  As of 01/13/2012  9:03 AM   STOP taking these medications         HYDROcodone-ibuprofen 7.5-200 MG per tablet         TAKE these medications         CALTRATE 600+D 600-400 MG-UNIT per tablet   Generic drug: Calcium Carbonate-Vitamin D   Take 1 tablet by mouth daily.      Casanthranol-Docusate Sodium 30-100 MG Caps   Take by mouth.      ciprofloxacin 500 MG tablet   Commonly known as: CIPRO   Take 1 tablet (500 mg total) by mouth 2 (two) times daily.      DULoxetine 60 MG capsule   Commonly known as: CYMBALTA   Take 1 capsule (60 mg total) by mouth daily.      escitalopram 10 MG tablet   Commonly known as: LEXAPRO   Take 10 mg by mouth daily.      gabapentin 300 MG capsule   Commonly known as: NEURONTIN   Take 2 capsules (600 mg total) by mouth 3 (three) times daily.      HYDROcodone-acetaminophen 5-325 MG per tablet   Commonly known as: NORCO   Take 1-2 tablets by mouth every 4 (four) hours as needed.      insulin NPH-insulin regular (70-30) 100 UNIT/ML injection   Commonly known as: NOVOLIN 70/30   Inject 35-40 Units into the skin See admin instructions. 40 units in the morning and 35 units in the evening      lisinopril 5 MG tablet   Commonly known as: PRINIVIL,ZESTRIL   Take 1 tablet (5 mg total) by mouth daily.      multivitamin tablet   Take 1 tablet by mouth daily.      polyethylene glycol packet   Commonly known as: MIRALAX / GLYCOLAX     Take 17 g by mouth daily.      sulfamethoxazole-trimethoprim 400-80 MG per tablet   Commonly known as: BACTRIM,SEPTRA   Take 1 tablet by mouth every 12 (twelve) hours.      vitamin C 500 MG tablet   Commonly known as: ASCORBIC ACID   Take 500 mg by mouth daily.      Vitamin D 2000 UNITS tablet   Take 2,000 Units by mouth daily.              Consults: Treatment Team:  Nita Sells, MD   SIGNIFICANT DIAGNOSTIC STUDIES:  Ct Foot Left W Contrast  01/11/2012  *RADIOLOGY REPORT*  Clinical Data: Evaluate abscess along the body of the foot.  CT OF THE LEFT FOOT WITH CONTRAST  Technique:  Multidetector CT imaging was performed following the standard protocol during bolus administration of intravenous contrast.  Contrast: 121m OMNIPAQUE IOHEXOL 300 MG/ML IJ SOLN  Comparison: 01/10/2012 radiograph  Findings: There is stranding of the subcutaneous fat along the plantar surface of the foot.  At the level of  the third metatarsal head there is a 6 mm oval density along the skin surface.  There is no deep extension.  No acute osseous abnormality.  No aggressive osseous lesion. And posterior calcaneal enthesopathic changes.  Mild midfoot DJD.  IMPRESSION: Plantar surface fat stranding may reflects cellulitis.  6 mm fluid density along the skin surface, well superficial to the third metatarsal head.  Correlate with direct inspection.  There is no deep extension.  Original Report Authenticated By: Suanne Marker, M.D.   Dg Foot Complete Left  01/10/2012  *RADIOLOGY REPORT*  Clinical Data: Pain  LEFT FOOT - COMPLETE 3+ VIEW  Comparison: None.  Findings: Spurring is present at the inferior and posterior calcaneus.  No acute fracture and no dislocation.  No destructive bone lesion.  IMPRESSION: No acute bony pathology.  Original Report Authenticated By: Jamas Lav, M.D.      Recent Results (from the past 240 hour(s))  WOUND CULTURE     Status: Normal (Preliminary result)    Collection Time   01/11/12  3:29 PM      Component Value Range Status Comment   Specimen Description FOOT   Final    Special Requests Immunocompromised   Final    Gram Stain     Final    Value: NO WBC SEEN     RARE SQUAMOUS EPITHELIAL CELLS PRESENT     RARE GRAM POSITIVE COCCI     IN PAIRS   Culture Culture reincubated for better growth   Final    Report Status PENDING   Incomplete     BRIEF ADMITTING H & P: This is a very pleasant 56 year old female with diabetes mellitus and who states that lasted for weeks she's been having increasing pain in the sole of the left foot. It is not to the point where she is almost unable to walk on it. She denies any fevers, chills. She states that today she had some nausea but no vomiting. She did see her PCP however she has not proceed with a workup because she is uninsured. History provided by the patient.  Hospital Course:  Cellulitis and abscess of Left foot (01/11/2012) Patient was admitted to regular bed. She had  I and D on 3-14 perform  by Dr Ola Spurr. Patient recieved vancomycin and IV ciprofloxacin for 2 days.She will be discharge on  oral cipro and bactrim. She is diabetic, infection could be secondary to multiple bacteria morphotypes. Please Follow culture result. She will need to follow with Dr [name] in 7 to 10 days.   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  She is not on immune suppressive medications.   DIABETES MELLITUS, TYPE II (12/23/2006) Continue with 70/30. She will need better Blood sugar controlled. Patient advised to follow with PCP and work on diet.   HYPERLIPIDEMIA (06/03/2008)  HYPERTENSION (12/23/2006) Continue with lisinopril.  Hyponatremia/Pseudo hyponatremia: Resolved with IV fluids.  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 Please follow  culture result. Discharge Orders    Future Orders Please Complete By Expires   Diet Carb Modified      Increase activity slowly        Follow-up Information    Follow up with Nita Sells, MD in 1 week.   Contact information:   [place], [place]  [address] [phone]           DISCHARGE EXAM:  General: Alert, awake, oriented x3, in no acute distress.  HEENT: No bruits, no goiter.  Heart: Regular rate and rhythm, without murmurs, rubs, gallops.  Lungs: Crackles left side, bilateral air movement.  Abdomen: Soft, nontender, nondistended, positive bowel sounds.  Neuro: Grossly intact, nonfocal.  Left foot with swelling, outpocketing soft , tender plantar aspect.    Blood pressure 123/82, pulse 85, temperature 98.2 F (36.8 C), temperature source Oral, resp. rate 16, height 5' 1"  (1.549 m), weight 67.132 kg (148 lb), SpO2 96.00%.   Basename 01/12/12 0425 01/10/12 2331  NA 134* 131*  K 3.7 4.0  CL 100 94*  CO2 29 26  GLUCOSE 256* 531*  BUN 12 13  CREATININE 0.73 0.85  CALCIUM 8.7 9.7  MG -- --  PHOS -- --    Basename 01/12/12 0425 01/10/12 2331  WBC 4.7 8.1  NEUTROABS -- 4.9  HGB 13.0 14.4  HCT 37.9 40.3  MCV 81.7 79.6  PLT 134* 165    Signed: Marquerite Forsman M.D. 01/13/2012, 9:03 AM

## 2012-01-13 NOTE — Progress Notes (Signed)
Pt stable, scripts, d/c instructions, and crutches given.  No questions/concerns voiced by patient.  Transported via wheelchair with NT and husband to private vehicle.

## 2012-01-13 NOTE — Progress Notes (Signed)
Cm spoke with pt concerning d/c planning. No HH services needed. Pt states  MD said she would go home with crutches. RN notified no notes from PT/OT or MD noted concerning crutches. [place] notified of pt referral for DME. RN made aware orders needed. Pt instructed that deliver of DME may take up to two hours.  Arlean Hopping [phone]

## 2012-01-14 LAB — WOUND CULTURE: Gram Stain: NONE SEEN

## 2012-01-23 ENCOUNTER — Encounter: Payer: Self-pay | Admitting: Family Medicine

## 2012-01-23 ENCOUNTER — Ambulatory Visit (INDEPENDENT_AMBULATORY_CARE_PROVIDER_SITE_OTHER): Payer: Self-pay | Admitting: Family Medicine

## 2012-01-23 VITALS — BP 140/80 | HR 101 | Temp 98.9°F | Wt 171.4 lb

## 2012-01-23 DIAGNOSIS — E119 Type 2 diabetes mellitus without complications: Secondary | ICD-10-CM

## 2012-01-23 DIAGNOSIS — L02619 Cutaneous abscess of unspecified foot: Secondary | ICD-10-CM

## 2012-01-23 DIAGNOSIS — L03119 Cellulitis of unspecified part of limb: Secondary | ICD-10-CM

## 2012-01-23 MED ORDER — HYDROCODONE-ACETAMINOPHEN 5-325 MG PO TABS: 1.0000 | ORAL_TABLET | ORAL | Status: DC | PRN

## 2012-01-23 MED ORDER — CIPROFLOXACIN HCL 500 MG PO TABS: 500.0000 mg | ORAL_TABLET | Freq: Two times a day (BID) | ORAL | Status: DC

## 2012-01-23 MED ORDER — CIPROFLOXACIN HCL 500 MG PO TABS: 500.0000 mg | ORAL_TABLET | Freq: Two times a day (BID) | ORAL | Status: AC

## 2012-01-23 NOTE — Assessment & Plan Note (Signed)
con't abx New dressing put in place F/u surgeon as previously stated

## 2012-01-23 NOTE — Progress Notes (Signed)
  Subjective:    [name], female    DOB: May 23, 1956, 56 y.o.   MRN: [id number]  HPI  Pt here to f/u cellulitis/ abscess L foot.  Ortho asked pt to f/u here with culture results and con't abx until seen by ortho. Pt is doing much better.  She has been changing dressing herself.   See hospital records.   Review of Systems As above    Objective:   Physical Exam  Constitutional: She is oriented to person, place, and time.  Musculoskeletal:       Bottom L foot---  Wound almost completely healed with no pain  Neurological: She is alert and oriented to person, place, and time.  Skin: No erythema.  Psychiatric: She has a normal mood and affect. Her behavior is normal. Judgment and thought content normal.          Assessment & Plan:

## 2012-01-24 LAB — LIPID PANEL
HDL: 45.2 mg/dL (ref >39.00)
Total CHOL/HDL Ratio: 4
VLDL: 40.4 mg/dL — ABNORMAL HIGH (ref 0.0–40.0)

## 2012-01-24 LAB — CBC WITH DIFFERENTIAL/PLATELET
Basophils Absolute: 0 10*3/uL (ref 0.0–0.1)
Eosinophils Absolute: 0 10*3/uL (ref 0.0–0.7)
Lymphocytes Relative: 33.1 % (ref 12.0–46.0)
MCHC: 33.5 g/dL (ref 30.0–36.0)
Monocytes Relative: 9.7 % (ref 3.0–12.0)
Neutrophils Relative %: 56.2 % (ref 43.0–77.0)
Platelets: 166 10*3/uL (ref 150.0–400.0)
RDW: 13.2 % (ref 11.5–14.6)

## 2012-01-24 LAB — BASIC METABOLIC PANEL
BUN: 15 mg/dL (ref 6–23)
CO2: 26 mEq/L (ref 19–32)
Calcium: 9.5 mg/dL (ref 8.4–10.5)
Creatinine, Ser: 0.7 mg/dL (ref 0.4–1.2)
GFR: 100.21 mL/min (ref >60.00)
Glucose, Bld: 293 mg/dL — ABNORMAL HIGH (ref 70–99)
Sodium: 133 mEq/L — ABNORMAL LOW (ref 135–145)

## 2012-01-24 LAB — HEPATIC FUNCTION PANEL
AST: 20 U/L (ref 0–37)
Albumin: 3.9 g/dL (ref 3.5–5.2)
Alkaline Phosphatase: 80 U/L (ref 39–117)
Total Bilirubin: 0.6 mg/dL (ref 0.3–1.2)

## 2012-01-24 LAB — LDL CHOLESTEROL, DIRECT: Direct LDL: 115.8 mg/dL

## 2012-02-07 MED ORDER — INSULIN NPH ISOPHANE & REGULAR (70-30) 100 UNIT/ML ~~LOC~~ SUSP: SUBCUTANEOUS | Status: DC

## 2012-02-07 MED ORDER — ATORVASTATIN CALCIUM 20 MG PO TABS: 20.0000 mg | ORAL_TABLET | Freq: Every day | ORAL | Status: DC

## 2012-02-14 ENCOUNTER — Other Ambulatory Visit: Payer: Self-pay | Admitting: Family Medicine

## 2012-02-15 NOTE — Telephone Encounter (Signed)
01-23-12,last OV, last filled #30

## 2012-02-29 ENCOUNTER — Other Ambulatory Visit: Payer: Self-pay | Admitting: Family Medicine

## 2012-02-29 NOTE — Telephone Encounter (Signed)
Last seen 01/23/12 and 02/14/12 # 30. Please advise    KP

## 2012-02-29 NOTE — Telephone Encounter (Signed)
refill for Norco 5-325MG Tab Qty 30 Take one to two tablets by mouth every 4-hours as needed  Last fill 4.18.13  Last OV 03.26.2013

## 2012-02-29 NOTE — Telephone Encounter (Signed)
Has pt seen ortho---if pain med is going to be long term she will need pain management

## 2012-03-01 MED ORDER — HYDROCODONE-ACETAMINOPHEN 5-325 MG PO TABS: 1.0000 | ORAL_TABLET | ORAL | Status: DC | PRN

## 2012-03-01 NOTE — Telephone Encounter (Signed)
Refill x1 

## 2012-03-01 NOTE — Telephone Encounter (Signed)
She said she does not see Ortho until the 27th of May . Please advise    KP

## 2012-03-18 ENCOUNTER — Other Ambulatory Visit: Payer: Self-pay | Admitting: Family Medicine

## 2012-03-18 NOTE — Telephone Encounter (Signed)
Last seen 01/23/12 and filled 03/01/12 #30. Please advise     KP

## 2012-03-19 ENCOUNTER — Other Ambulatory Visit: Payer: Self-pay | Admitting: Family Medicine

## 2012-03-19 NOTE — Telephone Encounter (Signed)
Last seen 01/23/12 and filled 03/01/12 # 30. Please advise     KP

## 2012-03-20 ENCOUNTER — Other Ambulatory Visit: Payer: Self-pay | Admitting: Family Medicine

## 2012-03-20 NOTE — Telephone Encounter (Signed)
Pt calling wanting to know why med has not been filled yet. Review chart as well as spoke to Dr Etter Sjogren who advise that it was too soon to fill med and #30 was suppose to be a 30 day supply. Pt replies that she is suppose to take med every 4 hours prn and she had to take some addition med due to her feet hurting. Per Dr Etter Sjogren Pt needs to get refill from whomever she saw for foot. Pt notes that her foot issue in a ongoing problem that she has had for years that deals with her DM.  Pt advise if she is needing more med she will need to come in for OV. Pt coming in for OV on tomorrow to discuss foot pain further.

## 2012-03-21 ENCOUNTER — Ambulatory Visit (INDEPENDENT_AMBULATORY_CARE_PROVIDER_SITE_OTHER): Payer: Self-pay | Admitting: Family Medicine

## 2012-03-21 ENCOUNTER — Encounter: Payer: Self-pay | Admitting: Family Medicine

## 2012-03-21 VITALS — BP 118/78 | HR 95 | Temp 98.5°F | Wt 169.0 lb

## 2012-03-21 DIAGNOSIS — G609 Hereditary and idiopathic neuropathy, unspecified: Secondary | ICD-10-CM

## 2012-03-21 DIAGNOSIS — E1140 Type 2 diabetes mellitus with diabetic neuropathy, unspecified: Secondary | ICD-10-CM

## 2012-03-21 DIAGNOSIS — E1149 Type 2 diabetes mellitus with other diabetic neurological complication: Secondary | ICD-10-CM

## 2012-03-21 DIAGNOSIS — E119 Type 2 diabetes mellitus without complications: Secondary | ICD-10-CM

## 2012-03-21 DIAGNOSIS — M25571 Pain in right ankle and joints of right foot: Secondary | ICD-10-CM | POA: Insufficient documentation

## 2012-03-21 DIAGNOSIS — M2550 Pain in unspecified joint: Secondary | ICD-10-CM

## 2012-03-21 DIAGNOSIS — R609 Edema, unspecified: Secondary | ICD-10-CM

## 2012-03-21 DIAGNOSIS — M791 Myalgia, unspecified site: Secondary | ICD-10-CM

## 2012-03-21 DIAGNOSIS — IMO0001 Reserved for inherently not codable concepts without codable children: Secondary | ICD-10-CM

## 2012-03-21 DIAGNOSIS — G629 Polyneuropathy, unspecified: Secondary | ICD-10-CM

## 2012-03-21 DIAGNOSIS — E785 Hyperlipidemia, unspecified: Secondary | ICD-10-CM

## 2012-03-21 DIAGNOSIS — E1142 Type 2 diabetes mellitus with diabetic polyneuropathy: Secondary | ICD-10-CM

## 2012-03-21 MED ORDER — HYDROCODONE-ACETAMINOPHEN 5-325 MG PO TABS: 1.0000 | ORAL_TABLET | ORAL | Status: DC | PRN

## 2012-03-21 MED ORDER — GABAPENTIN 300 MG PO CAPS: 600.0000 mg | ORAL_CAPSULE | Freq: Three times a day (TID) | ORAL | Status: DC

## 2012-03-21 NOTE — Patient Instructions (Signed)
Arthralgia Your caregiver has diagnosed you as suffering from an arthralgia. Arthralgia means there is pain in a joint. This can come from many reasons including:  Bruising the joint which causes soreness (inflammation) in the joint.   Wear and tear on the joints which occur as we grow older (osteoarthritis).   Overusing the joint.   Various forms of arthritis.   Infections of the joint.  Regardless of the cause of pain in your joint, most of these different pains respond to anti-inflammatory drugs and rest. The exception to this is when a joint is infected, and these cases are treated with antibiotics, if it is a bacterial infection. HOME CARE INSTRUCTIONS   Rest the injured area for as long as directed by your caregiver. Then slowly start using the joint as directed by your caregiver and as the pain allows. Crutches as directed may be useful if the ankles, knees or hips are involved. If the knee was splinted or casted, continue use and care as directed. If an stretchy or elastic wrapping bandage has been applied today, it should be removed and re-applied every 3 to 4 hours. It should not be applied tightly, but firmly enough to keep swelling down. Watch toes and feet for swelling, bluish discoloration, coldness, numbness or excessive pain. If any of these problems (symptoms) occur, remove the ace bandage and re-apply more loosely. If these symptoms persist, contact your caregiver or return to this location.   For the first 24 hours, keep the injured extremity elevated on pillows while lying down.   Apply ice for 15 to 20 minutes to the sore joint every couple hours while awake for the first half day. Then 3 to 4 times per day for the first 48 hours. Put the ice in a plastic bag and place a towel between the bag of ice and your skin.   Wear any splinting, casting, elastic bandage applications, or slings as instructed.   Only take over-the-counter or prescription medicines for pain,  discomfort, or fever as directed by your caregiver. Do not use aspirin immediately after the injury unless instructed by your physician. Aspirin can cause increased bleeding and bruising of the tissues.   If you were given crutches, continue to use them as instructed and do not resume weight bearing on the sore joint until instructed.  Persistent pain and inability to use the sore joint as directed for more than 2 to 3 days are warning signs indicating that you should see a caregiver for a follow-up visit as soon as possible. Initially, a hairline fracture (break in bone) may not be evident on X-rays. Persistent pain and swelling indicate that further evaluation, non-weight bearing or use of the joint (use of crutches or slings as instructed), or further X-rays are indicated. X-rays may sometimes not show a small fracture until a week or 10 days later. Make a follow-up appointment with your own caregiver or one to whom we have referred you. A radiologist (specialist in reading X-rays) may read your X-rays. Make sure you know how you are to obtain your X-ray results. Do not assume everything is normal if you do not hear from [name]. SEEK MEDICAL CARE IF: Bruising, swelling, or pain increases. SEEK IMMEDIATE MEDICAL CARE IF:   Your fingers or toes are numb or blue.   The pain is not responding to medications and continues to stay the same or get worse.   The pain in your joint becomes severe.   You develop a fever over  102 F (38.9 C).   It becomes impossible to move or use the joint.  MAKE SURE YOU:   Understand these instructions.   Will watch your condition.   Will get help right away if you are not doing well or get worse.  Document Released: 10/16/2005 Document Revised: 10/05/2011 Document Reviewed: 06/03/2008 [facility] Patient Information 2012 [place].

## 2012-03-21 NOTE — Assessment & Plan Note (Signed)
Check labs Refill pain meds Refer rheum

## 2012-03-21 NOTE — Progress Notes (Signed)
  Subjective:    [name], female    DOB: [birth year], 56 y.o.   MRN: [id number]  HPI Pt here c/o increase in joint pains and myalgias especially hands and feet.  They swell as well.  She can not wear her rings anymore.  She states she would like a referral to a new rheumatologist in [place].  She is getting married this weekend and will have insurance by Wednesday.   She was using more pain meds than usual so we asked    Review of Systems As above    Objective:   Physical Exam  Constitutional: She is oriented to person, place, and time. She appears well-developed and well-nourished.  Neck: Normal range of motion. Neck supple.  Cardiovascular: Normal rate and regular rhythm.   Pulmonary/Chest: Effort normal and breath sounds normal.  Musculoskeletal: She exhibits edema and tenderness.       Swelling in hands and feet No pain with palpation but pt c/o pain constatly in feet, legs and hands.   Neurological: She is alert and oriented to person, place, and time.  Psychiatric: She has a normal mood and affect. Her behavior is normal. Judgment and thought content normal.          Assessment & Plan:

## 2012-03-21 NOTE — Assessment & Plan Note (Signed)
Refill pain meds Refer to rheum

## 2012-03-27 ENCOUNTER — Other Ambulatory Visit: Payer: Self-pay

## 2012-04-04 ENCOUNTER — Other Ambulatory Visit: Payer: Self-pay | Admitting: Family Medicine

## 2012-04-05 NOTE — Telephone Encounter (Signed)
Last seen and filled 03/21/12 # 60. Please advise    KP

## 2012-04-16 ENCOUNTER — Ambulatory Visit (INDEPENDENT_AMBULATORY_CARE_PROVIDER_SITE_OTHER): Payer: BC Managed Care – PPO | Admitting: Family Medicine

## 2012-04-16 ENCOUNTER — Encounter: Payer: Self-pay | Admitting: Family Medicine

## 2012-04-16 VITALS — BP 120/76 | HR 109 | Temp 98.6°F | Wt 163.2 lb

## 2012-04-16 DIAGNOSIS — IMO0001 Reserved for inherently not codable concepts without codable children: Secondary | ICD-10-CM

## 2012-04-16 DIAGNOSIS — M791 Myalgia, unspecified site: Secondary | ICD-10-CM

## 2012-04-16 DIAGNOSIS — J329 Chronic sinusitis, unspecified: Secondary | ICD-10-CM

## 2012-04-16 DIAGNOSIS — J40 Bronchitis, not specified as acute or chronic: Secondary | ICD-10-CM

## 2012-04-16 DIAGNOSIS — E1140 Type 2 diabetes mellitus with diabetic neuropathy, unspecified: Secondary | ICD-10-CM

## 2012-04-16 DIAGNOSIS — E785 Hyperlipidemia, unspecified: Secondary | ICD-10-CM

## 2012-04-16 DIAGNOSIS — E1149 Type 2 diabetes mellitus with other diabetic neurological complication: Secondary | ICD-10-CM

## 2012-04-16 DIAGNOSIS — R609 Edema, unspecified: Secondary | ICD-10-CM

## 2012-04-16 DIAGNOSIS — E1142 Type 2 diabetes mellitus with diabetic polyneuropathy: Secondary | ICD-10-CM

## 2012-04-16 LAB — CBC WITH DIFFERENTIAL/PLATELET
Basophils Relative: 0.5 % (ref 0.0–3.0)
Eosinophils Relative: 1 % (ref 0.0–5.0)
HCT: 43.5 % (ref 36.0–46.0)
Lymphs Abs: 1.2 10*3/uL (ref 0.7–4.0)
MCV: 84.3 fl (ref 78.0–100.0)
Monocytes Absolute: 0.5 10*3/uL (ref 0.1–1.0)
RBC: 5.16 Mil/uL — ABNORMAL HIGH (ref 3.87–5.11)
WBC: 6.6 10*3/uL (ref 4.5–10.5)

## 2012-04-16 LAB — SEDIMENTATION RATE: Sed Rate: 16 mm/hr (ref 0–22)

## 2012-04-16 LAB — HEPATIC FUNCTION PANEL
ALT: 19 U/L (ref 0–35)
Albumin: 3.8 g/dL (ref 3.5–5.2)
Total Protein: 7.4 g/dL (ref 6.0–8.3)

## 2012-04-16 LAB — URINALYSIS
Bilirubin Urine: NEGATIVE
Ketones, ur: NEGATIVE
Urine Glucose: >=1000
Urobilinogen, UA: 0.2 (ref 0.0–1.0)

## 2012-04-16 LAB — MICROALBUMIN / CREATININE URINE RATIO: Microalb Creat Ratio: 2 mg/g (ref 0.0–30.0)

## 2012-04-16 LAB — BASIC METABOLIC PANEL
BUN: 14 mg/dL (ref 6–23)
CO2: 28 mEq/L (ref 19–32)
Chloride: 98 mEq/L (ref 96–112)
Potassium: 4.5 mEq/L (ref 3.5–5.1)

## 2012-04-16 LAB — LIPID PANEL
Cholesterol: 158 mg/dL (ref 0–200)
Triglycerides: 123 mg/dL (ref 0.0–149.0)

## 2012-04-16 MED ORDER — CLARITHROMYCIN ER 500 MG PO TB24: 1000.0000 mg | ORAL_TABLET | Freq: Every day | ORAL | Status: AC

## 2012-04-16 MED ORDER — MOMETASONE FUROATE 50 MCG/ACT NA SUSP: 2.0000 | Freq: Every day | NASAL | Status: DC

## 2012-04-16 MED ORDER — CLARITHROMYCIN ER 500 MG PO TB24: 1000.0000 mg | ORAL_TABLET | Freq: Every day | ORAL | Status: DC

## 2012-04-16 MED ORDER — GUAIFENESIN-CODEINE 100-10 MG/5ML PO SYRP: ORAL_SOLUTION | ORAL | Status: DC

## 2012-04-16 NOTE — Progress Notes (Signed)
  Subjective:     [name] [name] is a 56 y.o. female who presents for evaluation of sinus pain. Symptoms include: congestion, cough, facial pain, fevers, headaches, nasal congestion, sinus pressure and spitting/vomiting mucous. Onset of symptoms was 8 days ago. Symptoms have been gradually worsening since that time. Past history is significant for no history of pneumonia or bronchitis. Patient is a non-smoker.  The following portions of the patient's history were reviewed and updated as appropriate: allergies, current medications, past family history, past medical history, past social history, past surgical history and problem list.  Review of Systems Pertinent items are noted in HPI.   Objective:    BP 120/76  Pulse 109  Temp 98.6 F (37 C) (Oral)  Wt 163 lb 3.2 oz (74.027 kg)  SpO2 99% General appearance: alert, cooperative, appears stated age and no distress Ears: normal TM's and external ear canals both ears Nose: green discharge, mild congestion, turbinates pink, swollen, sinus tenderness bilateral Throat: abnormal findings: pnd Neck: mild anterior cervical adenopathy, supple, symmetrical, trachea midline and thyroid not enlarged, symmetric, no tenderness/mass/nodules Lungs: diminished breath sounds bilaterally Lymph nodes: Cervical adenopathy: mild    Assessment:    Acute bacterial sinusitis.   bronchitis Plan:    Nasal steroids per medication orders. Antihistamines per medication orders. Biaxin per medication orders. f/u prn

## 2012-04-16 NOTE — Addendum Note (Signed)
Addended by: Modena Morrow D on: 04/16/2012 11:19 AM   Modules accepted: Orders

## 2012-04-16 NOTE — Patient Instructions (Signed)

## 2012-04-17 ENCOUNTER — Other Ambulatory Visit: Payer: Self-pay | Admitting: Family Medicine

## 2012-04-17 DIAGNOSIS — E1165 Type 2 diabetes mellitus with hyperglycemia: Secondary | ICD-10-CM

## 2012-04-17 LAB — RHEUMATOID FACTOR: Rhuematoid fact SerPl-aCnc: <10 IU/mL (ref <=14)

## 2012-04-19 LAB — VITAMIN D 1,25 DIHYDROXY
Vitamin D2 1, 25 (OH)2: <8 pg/mL
Vitamin D3 1, 25 (OH)2: 69 pg/mL

## 2012-04-24 ENCOUNTER — Telehealth: Payer: Self-pay

## 2012-04-24 MED ORDER — INSULIN GLARGINE 100 UNIT/ML ~~LOC~~ SOLN: 40.0000 [IU] | Freq: Every day | SUBCUTANEOUS | Status: DC

## 2012-04-24 MED ORDER — INSULIN GLULISINE 100 UNIT/ML ~~LOC~~ SOLN: SUBCUTANEOUS | Status: DC

## 2012-04-24 NOTE — Telephone Encounter (Signed)
Message copied by Ewing Schlein on Wed Apr 24, 2012  3:21 PM ------      Message from: Rosalita Chessman      Created: Wed Apr 17, 2012  9:29 PM       DM --- not controlled---  Is she taking her insulin?  ---  If yes - change 70/30 to lantus  40 u qpm  And take apidra 10 u sq  Before breakfast and dinner--------  Refer to endo and call us in 1 week with blood sugars.      Cholesterol--- LDL goal < 70,  HDL >40,  TG < 150.  Diet and exercise will increase HDL and decrease LDL and TG.  Fish,  Fish Oil, Flaxseed oil will also help increase the HDL and decrease Triglycerides.  Increase lipitor to 40 mg  #30  1 po qhs,  2 refills.    Recheck labs in 3 months.------   272.4  250.00  Lipid, hep, bmp, hgba1, microalbumin

## 2012-04-24 NOTE — Telephone Encounter (Signed)
Discussed with patient and she voiced understanding. Medications updated     KP

## 2012-04-29 ENCOUNTER — Ambulatory Visit: Payer: BC Managed Care – PPO | Admitting: Endocrinology

## 2012-04-29 DIAGNOSIS — Z0289 Encounter for other administrative examinations: Secondary | ICD-10-CM

## 2012-05-15 ENCOUNTER — Other Ambulatory Visit: Payer: Self-pay | Admitting: Family Medicine

## 2012-05-15 NOTE — Telephone Encounter (Signed)
Last seen 04/08/12 and filled 04/04/12. Please advise   KP

## 2012-05-15 NOTE — Telephone Encounter (Signed)
When is rheum appointment?

## 2012-05-16 ENCOUNTER — Other Ambulatory Visit: Payer: Self-pay | Admitting: Family Medicine

## 2012-05-16 MED ORDER — HYDROCODONE-IBUPROFEN 7.5-200 MG PO TABS: 1.0000 | ORAL_TABLET | Freq: Four times a day (QID) | ORAL | Status: DC | PRN

## 2012-05-16 NOTE — Telephone Encounter (Signed)
Renee do you know if this patient has an apt scheduled with Rheum yet?

## 2012-05-16 NOTE — Addendum Note (Signed)
Addended by: Ewing Schlein on: 05/16/2012 08:28 AM   Modules accepted: Orders

## 2012-05-16 NOTE — Telephone Encounter (Signed)
Yes, she has been seen by Dr. Estanislado Pandy twice as of today.

## 2012-06-18 ENCOUNTER — Other Ambulatory Visit: Payer: Self-pay | Admitting: *Deleted

## 2012-06-18 MED ORDER — HYDROCODONE-IBUPROFEN 7.5-200 MG PO TABS: 1.0000 | ORAL_TABLET | Freq: Four times a day (QID) | ORAL | Status: DC | PRN

## 2012-06-18 NOTE — Telephone Encounter (Signed)
Refill x1 

## 2012-06-18 NOTE — Telephone Encounter (Signed)
Rx faxed.    KP 

## 2012-06-18 NOTE — Telephone Encounter (Signed)
Last OV 04-16-12, last filled 05-16-12 #60

## 2012-07-19 ENCOUNTER — Other Ambulatory Visit: Payer: Self-pay

## 2012-07-19 MED ORDER — HYDROCODONE-IBUPROFEN 7.5-200 MG PO TABS: 1.0000 | ORAL_TABLET | Freq: Four times a day (QID) | ORAL | Status: DC | PRN

## 2012-07-19 NOTE — Telephone Encounter (Signed)
Last seen 04/16/12 & filled 06/18/12 #60

## 2012-08-20 ENCOUNTER — Other Ambulatory Visit: Payer: Self-pay | Admitting: Family Medicine

## 2012-08-20 NOTE — Telephone Encounter (Signed)
Last seen 04/16/12 and filled 07/19/12 #60. please advise       KP

## 2012-08-20 NOTE — Telephone Encounter (Signed)
OK X1 

## 2012-08-29 ENCOUNTER — Telehealth: Payer: Self-pay | Admitting: Family Medicine

## 2012-08-29 NOTE — Telephone Encounter (Signed)
Caller: Twanda/Patient; Patient Name: [name] [name]; PCP: Rosalita Chessman.; Best Callback Phone Number: [phone] Calling regarding swelling to legs, feet and hands and eyes are puffy, onset 6 weeks, states she will swell up and goes away. Went to her pharmacist and they recommended something to ask her MD and she cannot remember what was recommended. Emergent signs and symptoms ruled out as per Edema , Atraumatic protocol except for see in 4 hours due to new fatigue, weakness and change in function, no appts available, PLEASE CALL FOR WORK IN at number listed. Call back parameters given.

## 2012-08-29 NOTE — Telephone Encounter (Signed)
Is she symptomatic?  If not-- we can see her tomorrow.

## 2012-08-29 NOTE — Telephone Encounter (Signed)
Bilateral feet and hand swelling and some muscle aches, denied SOB, denied chest pain and is willing to come tomorrow.  Apt scheduled 11/1 at 11:30     KP

## 2012-08-29 NOTE — Telephone Encounter (Signed)
Please advise      KP 

## 2012-08-30 ENCOUNTER — Ambulatory Visit: Payer: BC Managed Care – PPO | Admitting: Family Medicine

## 2012-09-19 ENCOUNTER — Other Ambulatory Visit: Payer: Self-pay | Admitting: Internal Medicine

## 2012-09-19 ENCOUNTER — Other Ambulatory Visit: Payer: Self-pay

## 2012-09-19 DIAGNOSIS — F329 Major depressive disorder, single episode, unspecified: Secondary | ICD-10-CM

## 2012-09-19 NOTE — Telephone Encounter (Signed)
Pt LMOVM inquiring why Rx for Cymbalta denied. I looked and couldn't tell a request had been done or why denied either need help. Last OV 04/16/12 Last filled 07/17/11 # 30 x 5 Plz advise pt   CB: [phone] MW

## 2012-09-20 ENCOUNTER — Other Ambulatory Visit: Payer: Self-pay | Admitting: Family Medicine

## 2012-09-20 ENCOUNTER — Ambulatory Visit (INDEPENDENT_AMBULATORY_CARE_PROVIDER_SITE_OTHER): Payer: BC Managed Care – PPO | Admitting: Family Medicine

## 2012-09-20 ENCOUNTER — Encounter: Payer: Self-pay | Admitting: Family Medicine

## 2012-09-20 VITALS — BP 140/76 | HR 93 | Temp 98.8°F | Wt 162.6 lb

## 2012-09-20 DIAGNOSIS — IMO0001 Reserved for inherently not codable concepts without codable children: Secondary | ICD-10-CM

## 2012-09-20 DIAGNOSIS — R609 Edema, unspecified: Secondary | ICD-10-CM

## 2012-09-20 DIAGNOSIS — M2540 Effusion, unspecified joint: Secondary | ICD-10-CM

## 2012-09-20 DIAGNOSIS — F3289 Other specified depressive episodes: Secondary | ICD-10-CM

## 2012-09-20 DIAGNOSIS — M791 Myalgia, unspecified site: Secondary | ICD-10-CM

## 2012-09-20 DIAGNOSIS — E119 Type 2 diabetes mellitus without complications: Secondary | ICD-10-CM

## 2012-09-20 DIAGNOSIS — F329 Major depressive disorder, single episode, unspecified: Secondary | ICD-10-CM

## 2012-09-20 LAB — CBC WITH DIFFERENTIAL/PLATELET
Basophils Relative: 1 % (ref 0–1)
Eosinophils Absolute: 0.1 10*3/uL (ref 0.0–0.7)
Eosinophils Relative: 1 % (ref 0–5)
Hemoglobin: 14.2 g/dL (ref 12.0–15.0)
MCH: 27.9 pg (ref 26.0–34.0)
MCHC: 33.1 g/dL (ref 30.0–36.0)
Monocytes Relative: 8 % (ref 3–12)
Neutrophils Relative %: 61 % (ref 43–77)

## 2012-09-20 LAB — LIPID PANEL
Cholesterol: 172 mg/dL (ref 0–200)
HDL: 40 mg/dL (ref >39)
LDL Cholesterol: 102 mg/dL — ABNORMAL HIGH (ref 0–99)
Total CHOL/HDL Ratio: 4.3 Ratio
Triglycerides: 152 mg/dL — ABNORMAL HIGH (ref <150)
VLDL: 30 mg/dL (ref 0–40)

## 2012-09-20 LAB — BASIC METABOLIC PANEL
BUN: 9 mg/dL (ref 6–23)
CO2: 28 mEq/L (ref 19–32)
Chloride: 97 mEq/L (ref 96–112)
Creat: 0.8 mg/dL (ref 0.50–1.10)

## 2012-09-20 LAB — HEPATIC FUNCTION PANEL
ALT: 20 U/L (ref 0–35)
AST: 17 U/L (ref 0–37)
Albumin: 4.1 g/dL (ref 3.5–5.2)
Total Bilirubin: 0.8 mg/dL (ref 0.3–1.2)
Total Protein: 6.7 g/dL (ref 6.0–8.3)

## 2012-09-20 LAB — SEDIMENTATION RATE: Sed Rate: 4 mm/hr (ref 0–22)

## 2012-09-20 LAB — TSH: TSH: 0.953 u[IU]/mL (ref 0.350–4.500)

## 2012-09-20 MED ORDER — DULOXETINE HCL 60 MG PO CPEP: 60.0000 mg | ORAL_CAPSULE | Freq: Every day | ORAL | Status: DC

## 2012-09-20 MED ORDER — HYDROCHLOROTHIAZIDE 25 MG PO TABS: 25.0000 mg | ORAL_TABLET | Freq: Every day | ORAL | Status: DC

## 2012-09-20 MED ORDER — METAXALONE 800 MG PO TABS: 800.0000 mg | ORAL_TABLET | Freq: Three times a day (TID) | ORAL | Status: DC

## 2012-09-20 NOTE — Telephone Encounter (Signed)
Rx sent.    MW 

## 2012-09-20 NOTE — Progress Notes (Signed)
  Subjective:    [name] [name] is a 56 y.o. female who presents for evaluation of edema in hands and feet. The edema has been severe. Onset of symptoms was several weeks ago, and patient reports symptoms have gradually worsened since that time. The edema is present all day. The patient states the problem is new. The swelling has been aggravated by nothing. The swelling has been relieved by nothing. Associated factors include: nothing. Cardiac risk factors: sedentary lifestyle.  She also c/o severe hip pain and leg pain with con't joint pains myalgias.  Pt was not happy with last rheum.  She could not tolerate the robaxin and pt states the PA told her they could not help her if she could not tolerate meds.    The following portions of the patient's history were reviewed and updated as appropriate: allergies, current medications, past family history, past medical history, past social history, past surgical history and problem list.  Review of Systems Pertinent items are noted in HPI.   Objective:    BP 140/76  Pulse 93  Temp 98.8 F (37.1 C) (Oral)  Wt 162 lb 9.6 oz (73.755 kg)  SpO2 97% General appearance: alert, cooperative, appears stated age and no distress Lungs: clear to auscultation bilaterally Heart: S1, S2 normal Extremities: edema + pitting edema in hands and ankles / feet   Cardiographics ECG: not done  Imaging Chest x-ray: not indicated   Assessment:     Edema----? etiology.    Plan:    Recommendations: decrease sodium in the diet and check labs. The patient was also instructed to call IMMEDIATELY (i.e., day or night) if any cardiopulmonary symptoms occur, especially chest pain, shortness of breath, dyspnea on exertion, paroxysmal nocturnal dyspnea, or orthopnea, and these were explained. Follow up in 2 weeks and as needed.  Check labs and will refer to different rheum for opinion-- at pt request

## 2012-09-20 NOTE — Patient Instructions (Addendum)
Myalgia, Adult Myalgia is the medical term for muscle pain. It is a symptom of many things. Nearly everyone at some time in their life has this. The most common cause for muscle pain is overuse or straining and more so when you are not in shape. Injuries and muscle bruises cause myalgias. Muscle pain without a history of injury can also be caused by a virus. It frequently comes along with the flu. Myalgia not caused by muscle strain can be present in a large number of infectious diseases. Some autoimmune diseases like lupus and fibromyalgia can cause muscle pain. Myalgia may be mild, or severe. SYMPTOMS  The symptoms of myalgia are simply muscle pain. Most of the time this is short lived and the pain goes away without treatment. DIAGNOSIS  Myalgia is diagnosed by your caregiver by taking your history. This means you tell him when the problems began, what they are, and what has been happening. If this has not been a long term problem, your caregiver may want to watch for a while to see what will happen. If it has been long term, they may want to do additional testing. TREATMENT  The treatment depends on what the underlying cause of the muscle pain is. Often anti-inflammatory medications will help. HOME CARE INSTRUCTIONS  If the pain in your muscles came from overuse, slow down your activities until the problems go away.  Myalgia from overuse of a muscle can be treated with alternating hot and cold packs on the muscle affected or with cold for the first couple days. If either heat or cold seems to make things worse, stop their use.  Apply ice to the sore area for 15 to 20 minutes, 3 to 4 times per day, while awake for the first 2 days of muscle soreness, or as directed. Put the ice in a plastic bag and place a towel between the bag of ice and your skin.  Only take over-the-counter or prescription medicines for pain, discomfort, or fever as directed by your caregiver.  Regular gentle exercise may help  if you are not active.  Stretching before strenuous exercise can help lower the risk of myalgia. It is normal when beginning an exercise regimen to feel some muscle pain after exercising. Muscles that have not been used frequently will be sore at first. If the pain is extreme, this may mean injury to a muscle. SEEK MEDICAL CARE IF:  You have an increase in muscle pain that is not relieved with medication.  You begin to run a temperature.  You develop nausea and vomiting.  You develop a stiff and painful neck.  You develop a rash.  You develop muscle pain after a tick bite.  You have continued muscle pain while working out even after you are in good condition. SEEK IMMEDIATE MEDICAL CARE IF: Any of your problems are getting worse and medications are not helping. MAKE SURE YOU:   Understand these instructions.  Will watch your condition.  Will get help right away if you are not doing well or get worse. Document Released: 09/07/2006 Document Revised: 01/08/2012 Document Reviewed: 11/27/2006 [facility] Patient Information 2013 [place].

## 2012-09-20 NOTE — Telephone Encounter (Signed)
Last seen 04/16/12 and filled 08/20/12 #60. Please advise    KP

## 2012-09-21 LAB — RHEUMATOID FACTOR: Rhuematoid fact SerPl-aCnc: <10 IU/mL (ref <=14)

## 2012-09-24 ENCOUNTER — Ambulatory Visit: Payer: BC Managed Care – PPO

## 2012-09-25 ENCOUNTER — Telehealth: Payer: Self-pay

## 2012-09-25 MED ORDER — EZETIMIBE 10 MG PO TABS: 10.0000 mg | ORAL_TABLET | Freq: Every day | ORAL | Status: DC

## 2012-09-25 NOTE — Telephone Encounter (Signed)
Discussed with patient and she voiced understanding. Rx faxed to the pharmacy per patient request.     KP

## 2012-09-25 NOTE — Telephone Encounter (Signed)
Message copied by Ewing Schlein on Wed Sep 25, 2012 12:01 PM ------      Message from: Rosalita Chessman      Created: Sun Sep 22, 2012 12:16 PM       Sodium low--- can add "a little" salt to food      Glucose is not controlled f/u endo      Cholesterol--- LDL goal < 70  HDL >40  TG < 150.  Diet and exercise will increase HDL and decrease LDL and TG.  Fish,  Fish Oil, Flaxseed oil will also help increase the HDL and decrease Triglycerides.   Recheck labs in 3 months------   Try zetia 10 mg  1po qd #30  2 refills-----not a statin--- we can give her some samples      272.4  Lipid, hep, bmp.

## 2012-10-15 ENCOUNTER — Emergency Department (HOSPITAL_BASED_OUTPATIENT_CLINIC_OR_DEPARTMENT_OTHER)
Admission: EM | Admit: 2012-10-15 | Discharge: 2012-10-15 | Disposition: A | Discharge status: Home / Self Care | Payer: BC Managed Care – PPO | Attending: Emergency Medicine | Admitting: Emergency Medicine

## 2012-10-15 ENCOUNTER — Encounter (HOSPITAL_BASED_OUTPATIENT_CLINIC_OR_DEPARTMENT_OTHER): Payer: Self-pay | Admitting: *Deleted

## 2012-10-15 ENCOUNTER — Ambulatory Visit (INDEPENDENT_AMBULATORY_CARE_PROVIDER_SITE_OTHER): Payer: BC Managed Care – PPO | Admitting: Family Medicine

## 2012-10-15 ENCOUNTER — Encounter: Payer: Self-pay | Admitting: Family Medicine

## 2012-10-15 ENCOUNTER — Emergency Department (HOSPITAL_BASED_OUTPATIENT_CLINIC_OR_DEPARTMENT_OTHER): Payer: BC Managed Care – PPO

## 2012-10-15 VITALS — BP 120/77 | HR 105 | Ht 61.0 in | Wt 129.0 lb

## 2012-10-15 DIAGNOSIS — Y9389 Activity, other specified: Secondary | ICD-10-CM | POA: Insufficient documentation

## 2012-10-15 DIAGNOSIS — W010XXA Fall on same level from slipping, tripping and stumbling without subsequent striking against object, initial encounter: Secondary | ICD-10-CM | POA: Insufficient documentation

## 2012-10-15 DIAGNOSIS — E7800 Pure hypercholesterolemia, unspecified: Secondary | ICD-10-CM | POA: Insufficient documentation

## 2012-10-15 DIAGNOSIS — S42209A Unspecified fracture of upper end of unspecified humerus, initial encounter for closed fracture: Secondary | ICD-10-CM

## 2012-10-15 DIAGNOSIS — I10 Essential (primary) hypertension: Secondary | ICD-10-CM | POA: Insufficient documentation

## 2012-10-15 DIAGNOSIS — E119 Type 2 diabetes mellitus without complications: Secondary | ICD-10-CM | POA: Insufficient documentation

## 2012-10-15 DIAGNOSIS — I252 Old myocardial infarction: Secondary | ICD-10-CM | POA: Insufficient documentation

## 2012-10-15 DIAGNOSIS — Z794 Long term (current) use of insulin: Secondary | ICD-10-CM | POA: Insufficient documentation

## 2012-10-15 DIAGNOSIS — Y929 Unspecified place or not applicable: Secondary | ICD-10-CM | POA: Insufficient documentation

## 2012-10-15 DIAGNOSIS — Z8659 Personal history of other mental and behavioral disorders: Secondary | ICD-10-CM | POA: Insufficient documentation

## 2012-10-15 DIAGNOSIS — Z79899 Other long term (current) drug therapy: Secondary | ICD-10-CM | POA: Insufficient documentation

## 2012-10-15 HISTORY — DX: Acute myocardial infarction, unspecified: I21.9

## 2012-10-15 HISTORY — DX: Post-traumatic stress disorder, unspecified: F43.10

## 2012-10-15 HISTORY — DX: Unspecified fracture of upper end of unspecified humerus, initial encounter for closed fracture: S42.209A

## 2012-10-15 MED ORDER — HYDROCODONE-IBUPROFEN 7.5-200 MG PO TABS: 1.0000 | ORAL_TABLET | Freq: Four times a day (QID) | ORAL | Status: DC | PRN

## 2012-10-15 MED ORDER — HYDROCODONE-ACETAMINOPHEN 5-325 MG PO TABS
2.0000 | ORAL_TABLET | Freq: Once | ORAL | Status: AC
  Administered 2012-10-15: 2 via ORAL
  Filled 2012-10-15: qty 2

## 2012-10-15 MED ORDER — HYDROMORPHONE HCL PF 1 MG/ML IJ SOLN
1.0000 mg | Freq: Once | INTRAMUSCULAR | Status: AC
  Administered 2012-10-15: 1 mg via INTRAMUSCULAR
  Filled 2012-10-15: qty 1

## 2012-10-15 MED ORDER — OXYCODONE-ACETAMINOPHEN 5-325 MG PO TABS: 2.0000 | ORAL_TABLET | ORAL | Status: DC | PRN

## 2012-10-15 NOTE — ED Notes (Signed)
Patient states she caught her foot under a carpet and fell this morning at 0430.  States she landed on her left shoulder and face.  Bloody nose for approximately one hour.  Now has pain in the left shoulder with bruising noted on the upper arm.  Neurovascular intact.

## 2012-10-15 NOTE — Patient Instructions (Addendum)
You have a proximal humerus fracture. Wear sling and swathe at all times except to bathe, change clothing - goal is to keep elbow in traction to allow fracture to heal properly. Ice 15 minutes at a time 3-4 times a day. Hydrocodone/ibuprofen every 6 hours as needed. Ok to take 2 additional ibuprofen with this every 6 hours as needed - make sure you take with food. Follow up with me in 1-2 weeks - we will repeat x-rays and reassess.
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
## 2012-10-17 ENCOUNTER — Encounter: Payer: Self-pay | Admitting: Family Medicine

## 2012-10-17 DIAGNOSIS — S42209A Unspecified fracture of upper end of unspecified humerus, initial encounter for closed fracture: Secondary | ICD-10-CM | POA: Insufficient documentation
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
## 2012-10-17 NOTE — Assessment & Plan Note (Signed)
minimal displacement though comminuted, within range that she should heal with conservative treatment.  Will curbside consult ortho for their input as well.  Start with sling, pain control, icing.  F/u in 1-2 weeks to repeat radiographs.  See instructions for further.

## 2012-10-21 ENCOUNTER — Other Ambulatory Visit: Payer: Self-pay | Admitting: *Deleted

## 2012-10-21 DIAGNOSIS — S42209A Unspecified fracture of upper end of unspecified humerus, initial encounter for closed fracture: Secondary | ICD-10-CM

## 2012-10-21 NOTE — Progress Notes (Signed)
Please provide patient with shower chair and wedge to accommodate patient's needs Will fax order to [facility] at [phone].

## 2012-10-28 ENCOUNTER — Encounter: Payer: Self-pay | Admitting: Family Medicine

## 2012-10-28 ENCOUNTER — Ambulatory Visit (HOSPITAL_BASED_OUTPATIENT_CLINIC_OR_DEPARTMENT_OTHER)
Admission: RE | Admit: 2012-10-28 | Discharge: 2012-10-28 | Disposition: A | Discharge status: Home / Self Care | Payer: BC Managed Care – PPO | Source: Ambulatory Visit | Attending: Family Medicine | Admitting: Family Medicine

## 2012-10-28 ENCOUNTER — Ambulatory Visit (INDEPENDENT_AMBULATORY_CARE_PROVIDER_SITE_OTHER): Payer: BC Managed Care – PPO | Admitting: Family Medicine

## 2012-10-28 VITALS — BP 127/82 | HR 103 | Ht 61.0 in | Wt 129.0 lb

## 2012-10-28 DIAGNOSIS — S42213A Unspecified displaced fracture of surgical neck of unspecified humerus, initial encounter for closed fracture: Secondary | ICD-10-CM | POA: Insufficient documentation

## 2012-10-28 DIAGNOSIS — X58XXXA Exposure to other specified factors, initial encounter: Secondary | ICD-10-CM | POA: Insufficient documentation

## 2012-10-28 DIAGNOSIS — M25529 Pain in unspecified elbow: Secondary | ICD-10-CM

## 2012-10-28 DIAGNOSIS — S42209A Unspecified fracture of upper end of unspecified humerus, initial encounter for closed fracture: Secondary | ICD-10-CM

## 2012-10-28 DIAGNOSIS — M25522 Pain in left elbow: Secondary | ICD-10-CM

## 2012-10-28 MED ORDER — HYDROCODONE-IBUPROFEN 7.5-200 MG PO TABS: 1.0000 | ORAL_TABLET | Freq: Four times a day (QID) | ORAL | Status: DC | PRN

## 2012-10-31 ENCOUNTER — Encounter: Payer: Self-pay | Admitting: Family Medicine

## 2012-10-31 DIAGNOSIS — M25522 Pain in left elbow: Secondary | ICD-10-CM | POA: Insufficient documentation
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
## 2012-10-31 NOTE — Assessment & Plan Note (Signed)
Left proximal humerus fracture - repeat radiographs show no additional displacement nor early healing of comminuted proximal humerus fracture.  Discussed case with ortho who advised trial of conservative care is reasonable given minimal displacement.  Consider CT if not improving/healing as would expect.  Continue with sling, vicoprofen, icing.  F/u in 2 weeks for repeat radiographs.  Advised not to take more pain medication than directed on script - if needs more than this she should discuss with PCP to ensure she could take more ibuprofen than is currently written.

## 2012-10-31 NOTE — Assessment & Plan Note (Signed)
radiographs negative except for soft tissue swelling. No effusion. Consistent with swelling and bruising from humerus fracture going down arm with gravity over past couple weeks. Excellent motion. Reassured. Will monitor.

## 2012-11-07 ENCOUNTER — Encounter: Payer: Self-pay | Admitting: Family Medicine

## 2012-11-07 ENCOUNTER — Ambulatory Visit (INDEPENDENT_AMBULATORY_CARE_PROVIDER_SITE_OTHER): Payer: BC Managed Care – PPO | Admitting: Family Medicine

## 2012-11-07 VITALS — BP 120/72 | HR 103 | Temp 98.3°F | Wt 156.0 lb

## 2012-11-07 DIAGNOSIS — M79673 Pain in unspecified foot: Secondary | ICD-10-CM

## 2012-11-07 DIAGNOSIS — M79609 Pain in unspecified limb: Secondary | ICD-10-CM

## 2012-11-07 NOTE — Progress Notes (Signed)
  Subjective:    [name], female    DOB: 1956/05/02, 57 y.o.   MRN: [id number]  HPI Pt here c/o pain bottom right foot.  No known injury.  Very painful to walk.   Review of Systems As above    Objective:   Physical Exam  BP 120/72  Pulse 103  Temp 98.3 F (36.8 C) (Oral)  Wt 156 lb (70.761 kg)  SpO2 95% General appearance: alert, cooperative, appears stated age and no distress Extremities: r foot---? blisters bottom of foot---? infection + callous as well      Assessment & Plan:  Foot pain--? Blisters----to see podiatry today

## 2012-11-07 NOTE — Patient Instructions (Addendum)
Blisters Blisters are fluid-filled sacs that form within the skin. Common causes of blistering are friction, burns, and exposure to irritating chemicals. The fluid in the blister protects the underlying damaged skin. Most of the time it is not recommended that you open blisters. When a blister is opened, there is an increased chance for infection. Usually, a blister will open on its own. They then dry up and peel off within 10 days. If the blister is tense and uncomfortable (painful) the fluid may be drained. If it is drained the roof of the blister should be left intact. The draining should only be done by a medical professional under aseptic conditions. Poorly fitting shoes and boots can cause blisters by being too tight or too loose. Wearing extra socks or using tape, bandages, or pads over the blister-prone area helps prevent the problem by reducing friction. Blisters heal more slowly if you have diabetes or if you have problems with your circulation. You need to be careful about medical follow-up to prevent infection. HOME CARE INSTRUCTIONS  Protect areas where blisters have formed until the skin is healed. Use a special bandage with a hole cut in the middle around the blister. This reduces pressure and friction. When the blister breaks, trim off the loose skin and keep the area clean by washing it with soap daily. Soaking the blister or broken-open blister with diluted vinegar twice daily for 15 minutes will dry it up and speed the healing. Use 3 tablespoons of white vinegar per quart of water (45 mL white vinegar per liter of water). An antibiotic ointment and a bandage can be used to cover the area after soaking.  SEEK MEDICAL CARE IF:   You develop increased redness, pain, swelling, or drainage in the blistered area.  You develop a pus-like discharge from the blistered area, chills, or a fever. MAKE SURE YOU:   Understand these instructions.  Will watch your condition.  Will get help right  away if you are not doing well or get worse. Document Released: 11/23/2004 Document Revised: 01/08/2012 Document Reviewed: 10/21/2008 [facility] Patient Information 2013 [place].

## 2012-11-11 ENCOUNTER — Encounter: Payer: Self-pay | Admitting: Family Medicine

## 2012-11-11 ENCOUNTER — Ambulatory Visit (INDEPENDENT_AMBULATORY_CARE_PROVIDER_SITE_OTHER): Payer: BC Managed Care – PPO | Admitting: Family Medicine

## 2012-11-11 ENCOUNTER — Ambulatory Visit (HOSPITAL_BASED_OUTPATIENT_CLINIC_OR_DEPARTMENT_OTHER)
Admission: RE | Admit: 2012-11-11 | Discharge: 2012-11-11 | Disposition: A | Discharge status: Home / Self Care | Payer: BC Managed Care – PPO | Source: Ambulatory Visit | Attending: Family Medicine | Admitting: Family Medicine

## 2012-11-11 VITALS — BP 118/79 | HR 99 | Ht 61.0 in | Wt 129.0 lb

## 2012-11-11 DIAGNOSIS — S42302A Unspecified fracture of shaft of humerus, left arm, initial encounter for closed fracture: Secondary | ICD-10-CM

## 2012-11-11 DIAGNOSIS — S42209A Unspecified fracture of upper end of unspecified humerus, initial encounter for closed fracture: Secondary | ICD-10-CM | POA: Insufficient documentation

## 2012-11-11 DIAGNOSIS — M25522 Pain in left elbow: Secondary | ICD-10-CM

## 2012-11-11 DIAGNOSIS — X58XXXA Exposure to other specified factors, initial encounter: Secondary | ICD-10-CM | POA: Insufficient documentation

## 2012-11-11 DIAGNOSIS — M25529 Pain in unspecified elbow: Secondary | ICD-10-CM

## 2012-11-11 DIAGNOSIS — S42309A Unspecified fracture of shaft of humerus, unspecified arm, initial encounter for closed fracture: Secondary | ICD-10-CM

## 2012-11-11 MED ORDER — HYDROCODONE-IBUPROFEN 7.5-200 MG PO TABS: 1.0000 | ORAL_TABLET | Freq: Four times a day (QID) | ORAL | Status: DC | PRN

## 2012-11-12 ENCOUNTER — Encounter: Payer: Self-pay | Admitting: Family Medicine
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
## 2012-11-12 NOTE — Assessment & Plan Note (Signed)
radiographs negative except for soft tissue swelling.  No effusion.  Consistent with swelling and bruising from humerus fracture going down arm with gravity over past couple weeks.  Excellent motion. Reassured.  Will monitor.

## 2012-11-12 NOTE — Assessment & Plan Note (Signed)
repeat radiographs show no additional displacement and some early healing.  Very reassuring.  Refilled vicoprofen.  Continue sling, icing.  F/u in 2 weeks for reevaluation, repeat radiographs.

## 2012-11-25 ENCOUNTER — Ambulatory Visit (HOSPITAL_BASED_OUTPATIENT_CLINIC_OR_DEPARTMENT_OTHER)
Admission: RE | Admit: 2012-11-25 | Discharge: 2012-11-25 | Disposition: A | Discharge status: Home / Self Care | Payer: BC Managed Care – PPO | Source: Ambulatory Visit | Attending: Family Medicine | Admitting: Family Medicine

## 2012-11-25 ENCOUNTER — Encounter: Payer: Self-pay | Admitting: Family Medicine

## 2012-11-25 ENCOUNTER — Ambulatory Visit (INDEPENDENT_AMBULATORY_CARE_PROVIDER_SITE_OTHER): Payer: BC Managed Care – PPO | Admitting: Family Medicine

## 2012-11-25 VITALS — BP 96/69 | HR 105 | Ht 60.0 in | Wt 128.0 lb

## 2012-11-25 DIAGNOSIS — S42209A Unspecified fracture of upper end of unspecified humerus, initial encounter for closed fracture: Secondary | ICD-10-CM

## 2012-11-25 DIAGNOSIS — S42293A Other displaced fracture of upper end of unspecified humerus, initial encounter for closed fracture: Secondary | ICD-10-CM | POA: Insufficient documentation

## 2012-11-25 DIAGNOSIS — X58XXXA Exposure to other specified factors, initial encounter: Secondary | ICD-10-CM | POA: Insufficient documentation

## 2012-11-25 DIAGNOSIS — M25522 Pain in left elbow: Secondary | ICD-10-CM

## 2012-11-25 DIAGNOSIS — M25529 Pain in unspecified elbow: Secondary | ICD-10-CM

## 2012-11-25 MED ORDER — HYDROCODONE-IBUPROFEN 7.5-200 MG PO TABS: 1.0000 | ORAL_TABLET | Freq: Four times a day (QID) | ORAL | Status: DC | PRN

## 2012-11-27 ENCOUNTER — Encounter: Payer: Self-pay | Admitting: Family Medicine

## 2012-11-28 ENCOUNTER — Encounter: Payer: Self-pay | Admitting: Family Medicine
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
## 2012-11-28 NOTE — Assessment & Plan Note (Signed)
Left proximal humerus fracture - repeat radiographs show no mild angulation though appears unchanged from prior radiographs and well within acceptable limits for nonoperative treatment.  Continues to have callus.  At this point will start with physical therapy to regain her motion then strength.  Discussed this will take several months to recover.  F/u in 4 weeks for reevaluation.  Refilled vicoprofen.  Sling only as needed now.  Repeat radiographs at follow-up.

## 2012-11-28 NOTE — Assessment & Plan Note (Signed)
radiographs negative except for soft tissue swelling.  No effusion.  Consistent with swelling and bruising from humerus fracture going down arm with gravity.  Excellent motion. Reassured.  Will monitor.

## 2012-12-02 ENCOUNTER — Ambulatory Visit: Payer: BC Managed Care – PPO | Attending: Family Medicine | Admitting: Rehabilitation

## 2012-12-02 DIAGNOSIS — IMO0001 Reserved for inherently not codable concepts without codable children: Secondary | ICD-10-CM | POA: Insufficient documentation

## 2012-12-02 DIAGNOSIS — M25519 Pain in unspecified shoulder: Secondary | ICD-10-CM | POA: Insufficient documentation

## 2012-12-02 DIAGNOSIS — M25619 Stiffness of unspecified shoulder, not elsewhere classified: Secondary | ICD-10-CM | POA: Insufficient documentation

## 2012-12-04 ENCOUNTER — Ambulatory Visit: Payer: BC Managed Care – PPO | Admitting: Rehabilitation

## 2012-12-06 ENCOUNTER — Ambulatory Visit: Payer: BC Managed Care – PPO | Admitting: Rehabilitation

## 2012-12-09 ENCOUNTER — Ambulatory Visit: Payer: BC Managed Care – PPO | Admitting: Rehabilitation

## 2012-12-11 ENCOUNTER — Ambulatory Visit: Payer: BC Managed Care – PPO | Admitting: Rehabilitation

## 2012-12-13 ENCOUNTER — Ambulatory Visit: Payer: BC Managed Care – PPO | Admitting: Rehabilitation

## 2012-12-16 ENCOUNTER — Ambulatory Visit: Payer: BC Managed Care – PPO | Admitting: Rehabilitation

## 2012-12-17 ENCOUNTER — Ambulatory Visit: Payer: BC Managed Care – PPO | Admitting: Rehabilitation

## 2012-12-20 ENCOUNTER — Encounter: Payer: BC Managed Care – PPO | Admitting: Rehabilitation

## 2012-12-20 ENCOUNTER — Encounter: Payer: Self-pay | Admitting: Lab

## 2012-12-23 ENCOUNTER — Ambulatory Visit: Payer: BC Managed Care – PPO | Admitting: Rehabilitation

## 2012-12-23 ENCOUNTER — Ambulatory Visit: Payer: BC Managed Care – PPO | Admitting: Family Medicine

## 2012-12-23 ENCOUNTER — Ambulatory Visit (HOSPITAL_BASED_OUTPATIENT_CLINIC_OR_DEPARTMENT_OTHER)
Admission: RE | Admit: 2012-12-23 | Discharge: 2012-12-23 | Disposition: A | Discharge status: Home / Self Care | Payer: BC Managed Care – PPO | Source: Ambulatory Visit | Attending: Family Medicine | Admitting: Family Medicine

## 2012-12-23 ENCOUNTER — Ambulatory Visit (INDEPENDENT_AMBULATORY_CARE_PROVIDER_SITE_OTHER): Payer: BC Managed Care – PPO | Admitting: Family Medicine

## 2012-12-23 ENCOUNTER — Encounter: Payer: Self-pay | Admitting: Family Medicine

## 2012-12-23 VITALS — BP 113/79 | HR 108 | Ht 60.0 in | Wt 128.0 lb

## 2012-12-23 DIAGNOSIS — S42209A Unspecified fracture of upper end of unspecified humerus, initial encounter for closed fracture: Secondary | ICD-10-CM

## 2012-12-23 DIAGNOSIS — S42213A Unspecified displaced fracture of surgical neck of unspecified humerus, initial encounter for closed fracture: Secondary | ICD-10-CM | POA: Insufficient documentation

## 2012-12-23 DIAGNOSIS — Z0289 Encounter for other administrative examinations: Secondary | ICD-10-CM

## 2012-12-23 DIAGNOSIS — X58XXXA Exposure to other specified factors, initial encounter: Secondary | ICD-10-CM | POA: Insufficient documentation

## 2012-12-24 ENCOUNTER — Encounter: Payer: Self-pay | Admitting: Family Medicine

## 2012-12-24 ENCOUNTER — Other Ambulatory Visit: Payer: Self-pay | Admitting: Family Medicine
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
## 2012-12-25 ENCOUNTER — Ambulatory Visit: Payer: BC Managed Care – PPO | Admitting: Rehabilitation

## 2012-12-25 NOTE — Telephone Encounter (Signed)
Please advise      KP 

## 2012-12-25 NOTE — Telephone Encounter (Signed)
Patient typically gets this on a monthly basis from PCP.  We had increased frequency due to proximal humerus fracture but can now return to PCP for usual monthly script as the increased frequency of medication is no longer necessary.

## 2012-12-25 NOTE — Telephone Encounter (Signed)
Last seen 11/07/12 with Dr.Lowne Rx filled by [facility] on 11/25/12 #60. Please advise      KP

## 2012-12-27 ENCOUNTER — Ambulatory Visit: Payer: BC Managed Care – PPO | Admitting: Rehabilitation

## 2012-12-30 ENCOUNTER — Ambulatory Visit: Payer: BC Managed Care – PPO | Attending: Family Medicine | Admitting: Rehabilitation

## 2012-12-30 DIAGNOSIS — IMO0001 Reserved for inherently not codable concepts without codable children: Secondary | ICD-10-CM | POA: Insufficient documentation

## 2012-12-30 DIAGNOSIS — M25519 Pain in unspecified shoulder: Secondary | ICD-10-CM | POA: Insufficient documentation

## 2012-12-30 DIAGNOSIS — M25619 Stiffness of unspecified shoulder, not elsewhere classified: Secondary | ICD-10-CM | POA: Insufficient documentation

## 2013-01-01 ENCOUNTER — Ambulatory Visit: Payer: BC Managed Care – PPO | Admitting: Rehabilitation

## 2013-01-03 ENCOUNTER — Ambulatory Visit: Payer: BC Managed Care – PPO | Admitting: Rehabilitation

## 2013-01-07 ENCOUNTER — Ambulatory Visit: Payer: BC Managed Care – PPO | Admitting: Rehabilitation

## 2013-01-09 ENCOUNTER — Encounter: Payer: Self-pay | Admitting: Lab

## 2013-01-10 ENCOUNTER — Encounter: Payer: Self-pay | Admitting: Family Medicine

## 2013-01-10 ENCOUNTER — Ambulatory Visit (INDEPENDENT_AMBULATORY_CARE_PROVIDER_SITE_OTHER): Payer: BC Managed Care – PPO | Admitting: Family Medicine

## 2013-01-10 VITALS — BP 118/80 | HR 96 | Temp 98.0°F | Wt 154.2 lb

## 2013-01-10 DIAGNOSIS — G894 Chronic pain syndrome: Secondary | ICD-10-CM

## 2013-01-10 DIAGNOSIS — E1159 Type 2 diabetes mellitus with other circulatory complications: Secondary | ICD-10-CM

## 2013-01-10 LAB — BASIC METABOLIC PANEL
BUN: 16 mg/dL (ref 6–23)
Chloride: 95 mEq/L — ABNORMAL LOW (ref 96–112)
Glucose, Bld: 243 mg/dL — ABNORMAL HIGH (ref 70–99)
Potassium: 4 mEq/L (ref 3.5–5.3)

## 2013-01-10 LAB — LIPID PANEL
HDL: 39 mg/dL — ABNORMAL LOW (ref >39)
LDL Cholesterol: 101 mg/dL — ABNORMAL HIGH (ref 0–99)
VLDL: 21 mg/dL (ref 0–40)

## 2013-01-10 LAB — HEPATIC FUNCTION PANEL
ALT: 14 U/L (ref 0–35)
Alkaline Phosphatase: 91 U/L (ref 39–117)
Indirect Bilirubin: 0.6 mg/dL (ref 0.0–0.9)
Total Protein: 6.8 g/dL (ref 6.0–8.3)

## 2013-01-10 MED ORDER — INSULIN GLARGINE 100 UNIT/ML ~~LOC~~ SOLN: 40.0000 [IU] | Freq: Every day | SUBCUTANEOUS | Status: DC

## 2013-01-10 MED ORDER — INSULIN GLULISINE 100 UNIT/ML ~~LOC~~ SOLN: SUBCUTANEOUS | Status: DC

## 2013-01-10 MED ORDER — HYDROCODONE-IBUPROFEN 7.5-200 MG PO TABS: 1.0000 | ORAL_TABLET | Freq: Four times a day (QID) | ORAL | Status: DC | PRN

## 2013-01-10 NOTE — Patient Instructions (Addendum)
Diabetes and Standards of Medical Care  Diabetes is complicated. You may find that your diabetes team includes a dietitian, nurse, diabetes educator, eye doctor, and more. To help everyone know what is going on and to help you get the care you deserve, the following schedule of care was developed to help keep you on track. Below are the tests, exams, vaccines, medicines, education, and plans you will need. A1c test  Performed at least 2 times a year if you are meeting treatment goals.  Performed 4 times a year if therapy has changed or if you are not meeting therapy/glycemic goals. Aspirin medicine  Take daily as directed by your caregiver. Blood pressure test  Performed at every routine medical visit. The goal is less than 130/80 mm/Hg. Dental exam  Get a dental exam at least 2 times a year. Dilated eye exam (retinal exam)  Type 1 diabetes: Get an exam within 5 years of diagnosis and then yearly.  Type 2 diabetes: Get an exam at diagnosis and then yearly. All exams thereafter can be extended to every 2 to 3 years if one or more exams have been normal. Foot care exam  Visual foot exams are performed at every routine medical visit. The exams check for cuts, injuries, or other problems with the feet.  A comprehensive foot exam should be done yearly. This includes visual inspection as well as assessing foot pulses and testing for loss of sensation. Kidney function test (urine microalbumin)  Performed once a year.  Type 1 diabetes: The first test is performed 5 years after diagnosis.  Type 2 diabetes: The first test is performed at the time of diagnosis.  A serum creatinine and estimated glomerular filtration rate (eGFR) test is done once a year to tell the level of chronic kidney disease (CKD), if present. Lipid profile (Cholesterol, HDL, LDL, Triglycerides)  Performed once a year for most people. If at low risk, may be assessed every 2 years.  The goal for LDL is less than 100  mg/dl. If at high risk, the goal is less than 70 mg/dl.  The goal for HDL is higher than 40 mg/dl for men and higher than 50 mg/dl for women.  The goal for triglycerides is less than 150 mg/dl. Flu vaccine, pneumonia vaccine, and hepatitis B vaccine  The flu vaccine is recommended yearly.  The pneumonia vaccine is generally given once in a lifetime. However, there are some instances where another vaccine is recommended. Check with your caregiver.  The hepatitis B vaccine is also recommended for adults with diabetes. Diabetes self-management education  Recommended at diagnosis and ongoing as needed. Treatment plan  Reviewed at every medical visit. Document Released: 08/13/2009 Document Revised: 01/08/2012 Document Reviewed: 04/18/2011 [facility] Patient Information 2013 [place].

## 2013-01-11 LAB — HEMOGLOBIN A1C: Mean Plasma Glucose: 355 mg/dL — ABNORMAL HIGH (ref <117)

## 2013-01-11 LAB — MICROALBUMIN / CREATININE URINE RATIO: Microalb, Ur: 12.66 mg/dL — ABNORMAL HIGH (ref 0.00–1.89)

## 2013-01-11 NOTE — Progress Notes (Signed)
  Subjective:     [name] [name] is a 57 y.o. female who presents for follow up of diabetes.. Current symptoms include: paresthesia of the feet. Patient denies hyperglycemia, hypoglycemia , increased appetite, nausea, polydipsia, polyuria, visual disturbances, vomiting and weight loss. Evaluation to date has been: fasting blood sugar, fasting lipid panel, hemoglobin A1C, microalbuminuria and labs are overdue. Home sugars: patient does not check sugars. Current treatments: Restarted insulin which has been unable to assess effectiveness.--it is unclear if pt was actually taking insulin.  She swears she was but she has not had it refilled in a long time.  I suspect she kept using pens and there was nothing in it.   Last dilated eye exam -due Pt was sent to office by podiatrist because her DM was not controlled--- labs were sent over.  The following portions of the patient's history were reviewed and updated as appropriate: allergies, current medications, past family history, past medical history, past social history, past surgical history and problem list.  Review of Systems Pertinent items are noted in HPI.    Objective:    BP 118/80  Pulse 96  Temp(Src) 98 F (36.7 C) (Oral)  Wt 154 lb 3.2 oz (69.945 kg)  BMI 30.12 kg/m2  SpO2 97% General appearance: alert, cooperative, appears stated age and no distress Lungs: clear to auscultation bilaterally Heart: S1, S2 normal Extremities: extremities normal, atraumatic, no cyanosis or edema   Sensory exam of the foot is normal, tested with the monofilament. Good pulses, no lesions or ulcers, good peripheral pulses.    Patient was not evaluated for proper footwear and sizing.  Laboratory: No components found with this basename: A1C      Lab Results  Component Value Date   HGBA1C 15.0* 04/16/2012   Labs from podiatrist also reviewed and scanned in. Assessment:    Diabetes mellitus Type II, under poor control.    Plan:    Discussed general  issues about diabetes pathophysiology and management. Reminded to get yearly retinal exam. Restarted insulin: lantus and apidra. Labs: fasting blood sugar, fasting lipid panel, hemoglobin A1C and microalbuminuria. Diabetes educator referral. Endocrinology clinic referral.

## 2013-01-14 ENCOUNTER — Telehealth: Payer: Self-pay | Admitting: *Deleted

## 2013-01-14 MED ORDER — ATORVASTATIN CALCIUM 10 MG PO TABS: 10.0000 mg | ORAL_TABLET | Freq: Every day | ORAL | Status: DC

## 2013-01-14 NOTE — Telephone Encounter (Signed)
Message copied by Marylen Ponto on Tue Jan 14, 2013  9:56 AM ------      Message from: Rosalita Chessman      Created: Sat Jan 11, 2013  3:07 PM       Pt was just started back on her insulin-- she will f/u endo      Cholesterol--- LDL goal < 70,  HDL >40,  TG < 150.  Diet and exercise will increase HDL and decrease LDL and TG.  Fish,  Fish Oil, Flaxseed oil will also help increase the HDL and decrease Triglycerides.   Recheck labs in 3 months.      ----start lipitor 10 mg  #30  1 po qpm , 2 refills        Recheck 3 months------  Diabetes uncontrolled, hyperlipidemia-----lipid, hep, bmp, hgba1c       ------

## 2013-01-14 NOTE — Telephone Encounter (Signed)
Rx sent 

## 2013-01-16 ENCOUNTER — Ambulatory Visit: Payer: BC Managed Care – PPO | Admitting: Endocrinology

## 2013-01-16 DIAGNOSIS — Z0289 Encounter for other administrative examinations: Secondary | ICD-10-CM

## 2013-01-20 ENCOUNTER — Ambulatory Visit: Payer: BC Managed Care – PPO | Admitting: *Deleted

## 2013-01-29 ENCOUNTER — Ambulatory Visit: Payer: BC Managed Care – PPO | Attending: Family Medicine | Admitting: Rehabilitation

## 2013-01-29 DIAGNOSIS — M25519 Pain in unspecified shoulder: Secondary | ICD-10-CM | POA: Insufficient documentation

## 2013-01-29 DIAGNOSIS — IMO0001 Reserved for inherently not codable concepts without codable children: Secondary | ICD-10-CM | POA: Insufficient documentation

## 2013-01-29 DIAGNOSIS — M25619 Stiffness of unspecified shoulder, not elsewhere classified: Secondary | ICD-10-CM | POA: Insufficient documentation

## 2013-02-03 ENCOUNTER — Encounter (HOSPITAL_BASED_OUTPATIENT_CLINIC_OR_DEPARTMENT_OTHER): Payer: Self-pay | Admitting: Emergency Medicine

## 2013-02-03 ENCOUNTER — Emergency Department (HOSPITAL_BASED_OUTPATIENT_CLINIC_OR_DEPARTMENT_OTHER): Payer: BC Managed Care – PPO

## 2013-02-03 DIAGNOSIS — L03119 Cellulitis of unspecified part of limb: Secondary | ICD-10-CM

## 2013-02-03 DIAGNOSIS — S42202D Unspecified fracture of upper end of left humerus, subsequent encounter for fracture with routine healing: Secondary | ICD-10-CM

## 2013-02-03 DIAGNOSIS — Z79899 Other long term (current) drug therapy: Secondary | ICD-10-CM

## 2013-02-03 DIAGNOSIS — E1159 Type 2 diabetes mellitus with other circulatory complications: Secondary | ICD-10-CM

## 2013-02-03 DIAGNOSIS — I2510 Atherosclerotic heart disease of native coronary artery without angina pectoris: Secondary | ICD-10-CM | POA: Diagnosis present

## 2013-02-03 DIAGNOSIS — M797 Fibromyalgia: Secondary | ICD-10-CM

## 2013-02-03 DIAGNOSIS — E876 Hypokalemia: Secondary | ICD-10-CM | POA: Diagnosis present

## 2013-02-03 DIAGNOSIS — E7800 Pure hypercholesterolemia, unspecified: Secondary | ICD-10-CM | POA: Diagnosis present

## 2013-02-03 DIAGNOSIS — Z23 Encounter for immunization: Secondary | ICD-10-CM

## 2013-02-03 DIAGNOSIS — F411 Generalized anxiety disorder: Secondary | ICD-10-CM | POA: Diagnosis present

## 2013-02-03 DIAGNOSIS — E11621 Type 2 diabetes mellitus with foot ulcer: Secondary | ICD-10-CM

## 2013-02-03 DIAGNOSIS — R079 Chest pain, unspecified: Secondary | ICD-10-CM

## 2013-02-03 DIAGNOSIS — J309 Allergic rhinitis, unspecified: Secondary | ICD-10-CM

## 2013-02-03 DIAGNOSIS — A4901 Methicillin susceptible Staphylococcus aureus infection, unspecified site: Secondary | ICD-10-CM | POA: Diagnosis present

## 2013-02-03 DIAGNOSIS — F329 Major depressive disorder, single episode, unspecified: Secondary | ICD-10-CM | POA: Diagnosis present

## 2013-02-03 DIAGNOSIS — K7460 Unspecified cirrhosis of liver: Secondary | ICD-10-CM | POA: Diagnosis present

## 2013-02-03 DIAGNOSIS — M79609 Pain in unspecified limb: Secondary | ICD-10-CM

## 2013-02-03 DIAGNOSIS — R932 Abnormal findings on diagnostic imaging of liver and biliary tract: Secondary | ICD-10-CM

## 2013-02-03 DIAGNOSIS — E871 Hypo-osmolality and hyponatremia: Secondary | ICD-10-CM | POA: Diagnosis present

## 2013-02-03 DIAGNOSIS — D638 Anemia in other chronic diseases classified elsewhere: Secondary | ICD-10-CM | POA: Diagnosis present

## 2013-02-03 DIAGNOSIS — R933 Abnormal findings on diagnostic imaging of other parts of digestive tract: Secondary | ICD-10-CM

## 2013-02-03 DIAGNOSIS — E1149 Type 2 diabetes mellitus with other diabetic neurological complication: Secondary | ICD-10-CM

## 2013-02-03 DIAGNOSIS — K3189 Other diseases of stomach and duodenum: Secondary | ICD-10-CM

## 2013-02-03 DIAGNOSIS — K219 Gastro-esophageal reflux disease without esophagitis: Secondary | ICD-10-CM | POA: Diagnosis present

## 2013-02-03 DIAGNOSIS — E118 Type 2 diabetes mellitus with unspecified complications: Secondary | ICD-10-CM

## 2013-02-03 DIAGNOSIS — L0390 Cellulitis, unspecified: Secondary | ICD-10-CM

## 2013-02-03 DIAGNOSIS — K5900 Constipation, unspecified: Secondary | ICD-10-CM

## 2013-02-03 DIAGNOSIS — IMO0002 Reserved for concepts with insufficient information to code with codable children: Principal | ICD-10-CM | POA: Diagnosis present

## 2013-02-03 DIAGNOSIS — E1169 Type 2 diabetes mellitus with other specified complication: Secondary | ICD-10-CM

## 2013-02-03 DIAGNOSIS — D649 Anemia, unspecified: Secondary | ICD-10-CM | POA: Diagnosis present

## 2013-02-03 DIAGNOSIS — I252 Old myocardial infarction: Secondary | ICD-10-CM

## 2013-02-03 DIAGNOSIS — M542 Cervicalgia: Secondary | ICD-10-CM

## 2013-02-03 DIAGNOSIS — R109 Unspecified abdominal pain: Secondary | ICD-10-CM

## 2013-02-03 DIAGNOSIS — IMO0001 Reserved for inherently not codable concepts without codable children: Secondary | ICD-10-CM

## 2013-02-03 DIAGNOSIS — R300 Dysuria: Secondary | ICD-10-CM

## 2013-02-03 DIAGNOSIS — M2550 Pain in unspecified joint: Secondary | ICD-10-CM

## 2013-02-03 DIAGNOSIS — M25522 Pain in left elbow: Secondary | ICD-10-CM

## 2013-02-03 DIAGNOSIS — G589 Mononeuropathy, unspecified: Secondary | ICD-10-CM | POA: Diagnosis present

## 2013-02-03 DIAGNOSIS — Z9889 Other specified postprocedural states: Secondary | ICD-10-CM

## 2013-02-03 DIAGNOSIS — F3289 Other specified depressive episodes: Secondary | ICD-10-CM | POA: Diagnosis present

## 2013-02-03 DIAGNOSIS — E1165 Type 2 diabetes mellitus with hyperglycemia: Principal | ICD-10-CM | POA: Diagnosis present

## 2013-02-03 DIAGNOSIS — E860 Dehydration: Secondary | ICD-10-CM

## 2013-02-03 DIAGNOSIS — L02619 Cutaneous abscess of unspecified foot: Secondary | ICD-10-CM | POA: Diagnosis present

## 2013-02-03 DIAGNOSIS — L03115 Cellulitis of right lower limb: Secondary | ICD-10-CM

## 2013-02-03 DIAGNOSIS — D696 Thrombocytopenia, unspecified: Secondary | ICD-10-CM

## 2013-02-03 DIAGNOSIS — N390 Urinary tract infection, site not specified: Secondary | ICD-10-CM | POA: Diagnosis present

## 2013-02-03 DIAGNOSIS — R55 Syncope and collapse: Secondary | ICD-10-CM

## 2013-02-03 DIAGNOSIS — L97509 Non-pressure chronic ulcer of other part of unspecified foot with unspecified severity: Secondary | ICD-10-CM | POA: Diagnosis present

## 2013-02-03 DIAGNOSIS — R209 Unspecified disturbances of skin sensation: Secondary | ICD-10-CM

## 2013-02-03 DIAGNOSIS — R319 Hematuria, unspecified: Secondary | ICD-10-CM

## 2013-02-03 DIAGNOSIS — M659 Synovitis and tenosynovitis, unspecified: Secondary | ICD-10-CM | POA: Diagnosis present

## 2013-02-03 DIAGNOSIS — Z794 Long term (current) use of insulin: Secondary | ICD-10-CM

## 2013-02-03 DIAGNOSIS — I10 Essential (primary) hypertension: Secondary | ICD-10-CM | POA: Diagnosis present

## 2013-02-03 DIAGNOSIS — M65979 Unspecified synovitis and tenosynovitis, unspecified ankle and foot: Secondary | ICD-10-CM | POA: Diagnosis present

## 2013-02-03 DIAGNOSIS — E785 Hyperlipidemia, unspecified: Secondary | ICD-10-CM | POA: Diagnosis present

## 2013-02-03 HISTORY — DX: Chronic kidney disease, unspecified: N18.9

## 2013-02-03 HISTORY — DX: Unspecified fracture of upper end of unspecified humerus, initial encounter for closed fracture: S42.209A

## 2013-02-03 HISTORY — DX: Angina pectoris, unspecified: I20.9

## 2013-02-03 HISTORY — DX: Gastro-esophageal reflux disease without esophagitis: K21.9

## 2013-02-03 LAB — BASIC METABOLIC PANEL
BUN: 20 mg/dL (ref 6–23)
CO2: 31 mEq/L (ref 19–32)
Calcium: 9.3 mg/dL (ref 8.4–10.5)
Chloride: 81 mEq/L — ABNORMAL LOW (ref 96–112)
Creatinine, Ser: 0.9 mg/dL (ref 0.50–1.10)
GFR calc Af Amer: 81 mL/min — ABNORMAL LOW (ref >90)
GFR calc non Af Amer: 70 mL/min — ABNORMAL LOW (ref >90)
Glucose, Bld: 520 mg/dL — ABNORMAL HIGH (ref 70–99)
Potassium: 3.2 mEq/L — ABNORMAL LOW (ref 3.5–5.1)
Sodium: 125 mEq/L — ABNORMAL LOW (ref 135–145)

## 2013-02-03 LAB — URINALYSIS, ROUTINE W REFLEX MICROSCOPIC
Bilirubin Urine: NEGATIVE
Glucose, UA: 500 mg/dL — AB
Ketones, ur: NEGATIVE mg/dL
Leukocytes, UA: NEGATIVE
Nitrite: NEGATIVE
Protein, ur: NEGATIVE mg/dL
Specific Gravity, Urine: 1.045 — ABNORMAL HIGH (ref 1.005–1.030)
Urobilinogen, UA: 0.2 mg/dL (ref 0.0–1.0)
pH: 5.5 (ref 5.0–8.0)

## 2013-02-03 LAB — CBC WITH DIFFERENTIAL/PLATELET
Basophils Absolute: 0 10*3/uL (ref 0.0–0.1)
Basophils Relative: 0 % (ref 0–1)
Eosinophils Absolute: 0 10*3/uL (ref 0.0–0.7)
Eosinophils Relative: 0 % (ref 0–5)
HCT: 33.7 % — ABNORMAL LOW (ref 36.0–46.0)
Hemoglobin: 11.8 g/dL — ABNORMAL LOW (ref 12.0–15.0)
Lymphocytes Relative: 9 % — ABNORMAL LOW (ref 12–46)
Lymphs Abs: 0.9 10*3/uL (ref 0.7–4.0)
MCH: 27.3 pg (ref 26.0–34.0)
MCHC: 35 g/dL (ref 30.0–36.0)
MCV: 77.8 fL — ABNORMAL LOW (ref 78.0–100.0)
Monocytes Absolute: 0.8 10*3/uL (ref 0.1–1.0)
Monocytes Relative: 8 % (ref 3–12)
Neutro Abs: 7.8 10*3/uL — ABNORMAL HIGH (ref 1.7–7.7)
Neutrophils Relative %: 82 % — ABNORMAL HIGH (ref 43–77)
Platelets: 225 10*3/uL (ref 150–400)
RBC: 4.33 MIL/uL (ref 3.87–5.11)
RDW: 13.4 % (ref 11.5–15.5)
WBC: 9.6 10*3/uL (ref 4.0–10.5)

## 2013-02-03 LAB — URINE MICROSCOPIC-ADD ON

## 2013-02-03 LAB — GLUCOSE, CAPILLARY: Glucose-Capillary: 293 mg/dL — ABNORMAL HIGH (ref 70–99)

## 2013-02-03 LAB — SEDIMENTATION RATE: Sed Rate: 101 mm/hr — ABNORMAL HIGH (ref 0–22)

## 2013-02-03 MED ORDER — VANCOMYCIN HCL IN DEXTROSE 1-5 GM/200ML-% IV SOLN
1000.0000 mg | Freq: Once | INTRAVENOUS | Status: AC
  Administered 2013-02-03: 1000 mg via INTRAVENOUS
  Filled 2013-02-03: qty 200

## 2013-02-03 MED ORDER — GABAPENTIN 300 MG PO CAPS
600.0000 mg | ORAL_CAPSULE | Freq: Three times a day (TID) | ORAL | Status: DC
  Administered 2013-02-03 – 2013-02-07 (×11): 600 mg via ORAL
  Filled 2013-02-03 (×3): qty 2
  Filled 2013-02-03: qty 1
  Filled 2013-02-03 (×3): qty 2
  Filled 2013-02-03: qty 1
  Filled 2013-02-03 (×6): qty 2

## 2013-02-03 MED ORDER — ENOXAPARIN SODIUM 40 MG/0.4ML ~~LOC~~ SOLN
40.0000 mg | SUBCUTANEOUS | Status: DC
  Administered 2013-02-03 – 2013-02-14 (×11): 40 mg via SUBCUTANEOUS
  Filled 2013-02-03 (×15): qty 0.4

## 2013-02-03 MED ORDER — IOHEXOL 300 MG/ML  SOLN: 50.0000 mL | Freq: Once | INTRAMUSCULAR | Status: AC | PRN

## 2013-02-03 MED ORDER — IOHEXOL 300 MG/ML  SOLN
100.0000 mL | Freq: Once | INTRAMUSCULAR | Status: AC | PRN
  Administered 2013-02-03: 100 mL via INTRAVENOUS

## 2013-02-03 MED ORDER — INSULIN ASPART 100 UNIT/ML IV SOLN
10.0000 [IU] | Freq: Once | INTRAVENOUS | Status: AC
  Administered 2013-02-03: 10 [IU] via INTRAVENOUS
  Filled 2013-02-03: qty 10
  Filled 2013-02-03: qty 0.1

## 2013-02-03 MED ORDER — HYDROMORPHONE HCL PF 1 MG/ML IJ SOLN
1.0000 mg | Freq: Once | INTRAMUSCULAR | Status: AC
  Administered 2013-02-03: 1 mg via INTRAVENOUS
  Filled 2013-02-03: qty 1

## 2013-02-03 MED ORDER — CALCIUM CARBONATE-VITAMIN D 500-200 MG-UNIT PO TABS
1.0000 | ORAL_TABLET | Freq: Every day | ORAL | Status: DC
  Administered 2013-02-04 – 2013-02-20 (×16): 1 via ORAL
  Filled 2013-02-03 (×17): qty 1

## 2013-02-03 MED ORDER — SODIUM CHLORIDE 0.9 % IV SOLN
INTRAVENOUS | Status: DC
  Administered 2013-02-03 – 2013-02-05 (×3): via INTRAVENOUS

## 2013-02-03 MED ORDER — INSULIN ASPART 100 UNIT/ML ~~LOC~~ SOLN: 0.0000 [IU] | Freq: Every day | SUBCUTANEOUS | Status: DC

## 2013-02-03 MED ORDER — INSULIN GLARGINE 100 UNIT/ML ~~LOC~~ SOLN
40.0000 [IU] | Freq: Every day | SUBCUTANEOUS | Status: DC
  Administered 2013-02-03 – 2013-02-05 (×3): 40 [IU] via SUBCUTANEOUS
  Filled 2013-02-03 (×5): qty 0.4

## 2013-02-03 MED ORDER — VITAMIN D 50 MCG (2000 UT) PO TABS: 2000.0000 [IU] | ORAL_TABLET | Freq: Every day | ORAL | Status: DC

## 2013-02-03 MED ORDER — METAXALONE 800 MG PO TABS
800.0000 mg | ORAL_TABLET | Freq: Three times a day (TID) | ORAL | Status: DC
  Administered 2013-02-03 – 2013-02-20 (×48): 800 mg via ORAL
  Filled 2013-02-03 (×52): qty 1

## 2013-02-03 MED ORDER — ADULT MULTIVITAMIN W/MINERALS CH
1.0000 | ORAL_TABLET | Freq: Every day | ORAL | Status: DC
  Administered 2013-02-04 – 2013-02-20 (×16): 1 via ORAL
  Filled 2013-02-03 (×17): qty 1

## 2013-02-03 MED ORDER — INSULIN ASPART 100 UNIT/ML ~~LOC~~ SOLN: 0.0000 [IU] | Freq: Three times a day (TID) | SUBCUTANEOUS | Status: DC

## 2013-02-03 MED ORDER — ACETAMINOPHEN 325 MG PO TABS
650.0000 mg | ORAL_TABLET | Freq: Four times a day (QID) | ORAL | Status: DC | PRN
  Administered 2013-02-12: 650 mg via ORAL
  Filled 2013-02-03: qty 2

## 2013-02-03 MED ORDER — INSULIN ASPART 100 UNIT/ML ~~LOC~~ SOLN
10.0000 [IU] | Freq: Three times a day (TID) | SUBCUTANEOUS | Status: DC
  Administered 2013-02-04 – 2013-02-19 (×37): 10 [IU] via SUBCUTANEOUS

## 2013-02-03 MED ORDER — POTASSIUM CHLORIDE CRYS ER 20 MEQ PO TBCR
40.0000 meq | EXTENDED_RELEASE_TABLET | ORAL | Status: AC
  Administered 2013-02-03 – 2013-02-04 (×3): 40 meq via ORAL
  Filled 2013-02-03: qty 2
  Filled 2013-02-03 (×2): qty 1
  Filled 2013-02-03: qty 2

## 2013-02-03 MED ORDER — CALCIUM CARBONATE-VITAMIN D 600-400 MG-UNIT PO TABS: 1.0000 | ORAL_TABLET | Freq: Every day | ORAL | Status: DC

## 2013-02-03 MED ORDER — SODIUM CHLORIDE 0.9 % IV SOLN: INTRAVENOUS | Status: DC

## 2013-02-03 MED ORDER — ESCITALOPRAM OXALATE 10 MG PO TABS
10.0000 mg | ORAL_TABLET | Freq: Every day | ORAL | Status: DC
  Administered 2013-02-04 – 2013-02-20 (×16): 10 mg via ORAL
  Filled 2013-02-03 (×17): qty 1

## 2013-02-03 MED ORDER — HYDROCODONE-ACETAMINOPHEN 5-325 MG PO TABS
1.0000 | ORAL_TABLET | ORAL | Status: DC | PRN
  Administered 2013-02-04 – 2013-02-05 (×7): 2 via ORAL
  Filled 2013-02-03 (×7): qty 2

## 2013-02-03 MED ORDER — VITAMIN D3 25 MCG (1000 UNIT) PO TABS
2000.0000 [IU] | ORAL_TABLET | Freq: Every day | ORAL | Status: DC
  Administered 2013-02-04 – 2013-02-20 (×16): 2000 [IU] via ORAL
  Filled 2013-02-03 (×17): qty 2

## 2013-02-03 MED ORDER — DULOXETINE HCL 60 MG PO CPEP
60.0000 mg | ORAL_CAPSULE | Freq: Every day | ORAL | Status: DC
  Administered 2013-02-04 – 2013-02-20 (×16): 60 mg via ORAL
  Filled 2013-02-03 (×17): qty 1

## 2013-02-03 MED ORDER — SODIUM CHLORIDE 0.9 % IV BOLUS (SEPSIS)
1000.0000 mL | Freq: Once | INTRAVENOUS | Status: AC
  Administered 2013-02-03: 1000 mL via INTRAVENOUS

## 2013-02-03 MED ORDER — VANCOMYCIN HCL IN DEXTROSE 1-5 GM/200ML-% IV SOLN
1000.0000 mg | Freq: Two times a day (BID) | INTRAVENOUS | Status: DC
  Filled 2013-02-03: qty 200

## 2013-02-03 MED ORDER — HYDROMORPHONE HCL PF 1 MG/ML IJ SOLN
1.0000 mg | INTRAMUSCULAR | Status: DC | PRN
  Administered 2013-02-03 – 2013-02-11 (×25): 1 mg via INTRAVENOUS
  Filled 2013-02-03 (×25): qty 1

## 2013-02-03 MED ORDER — POLYETHYLENE GLYCOL 3350 17 G PO PACK
17.0000 g | PACK | Freq: Every day | ORAL | Status: DC
  Administered 2013-02-04 – 2013-02-20 (×16): 17 g via ORAL
  Filled 2013-02-03 (×17): qty 1

## 2013-02-03 MED ORDER — INSULIN ASPART 100 UNIT/ML ~~LOC~~ SOLN
0.0000 [IU] | Freq: Every day | SUBCUTANEOUS | Status: DC
  Administered 2013-02-03: 4 [IU] via SUBCUTANEOUS
  Administered 2013-02-06 – 2013-02-17 (×3): 2 [IU] via SUBCUTANEOUS

## 2013-02-03 MED ORDER — VITAMIN C 500 MG PO TABS
500.0000 mg | ORAL_TABLET | Freq: Every day | ORAL | Status: DC
  Administered 2013-02-04 – 2013-02-20 (×16): 500 mg via ORAL
  Filled 2013-02-03 (×17): qty 1

## 2013-02-03 MED ORDER — ONDANSETRON HCL 4 MG/2ML IJ SOLN
4.0000 mg | Freq: Four times a day (QID) | INTRAMUSCULAR | Status: DC | PRN
  Administered 2013-02-04 – 2013-02-10 (×4): 4 mg via INTRAVENOUS
  Filled 2013-02-03 (×4): qty 2

## 2013-02-03 MED ORDER — INSULIN ASPART 100 UNIT/ML ~~LOC~~ SOLN
0.0000 [IU] | Freq: Three times a day (TID) | SUBCUTANEOUS | Status: DC
  Administered 2013-02-04: 4 [IU] via SUBCUTANEOUS
  Administered 2013-02-04: 11 [IU] via SUBCUTANEOUS
  Administered 2013-02-04: 4 [IU] via SUBCUTANEOUS
  Administered 2013-02-05 (×2): 7 [IU] via SUBCUTANEOUS
  Administered 2013-02-05 – 2013-02-06 (×2): 4 [IU] via SUBCUTANEOUS
  Administered 2013-02-06: 7 [IU] via SUBCUTANEOUS
  Administered 2013-02-06: 4 [IU] via SUBCUTANEOUS
  Administered 2013-02-07: 0 [IU] via SUBCUTANEOUS
  Administered 2013-02-07: 4 [IU] via SUBCUTANEOUS
  Administered 2013-02-08: 3 [IU] via SUBCUTANEOUS
  Administered 2013-02-08 – 2013-02-09 (×3): 4 [IU] via SUBCUTANEOUS
  Administered 2013-02-09: 7 [IU] via SUBCUTANEOUS
  Administered 2013-02-09: 4 [IU] via SUBCUTANEOUS
  Administered 2013-02-10: 3 [IU] via SUBCUTANEOUS
  Administered 2013-02-11: 4 [IU] via SUBCUTANEOUS
  Administered 2013-02-11: 3 [IU] via SUBCUTANEOUS
  Administered 2013-02-11 – 2013-02-12 (×2): 4 [IU] via SUBCUTANEOUS
  Administered 2013-02-12: 7 [IU] via SUBCUTANEOUS
  Administered 2013-02-12: 4 [IU] via SUBCUTANEOUS
  Administered 2013-02-13 (×2): 3 [IU] via SUBCUTANEOUS
  Administered 2013-02-13: 11 [IU] via SUBCUTANEOUS
  Administered 2013-02-14 (×2): 4 [IU] via SUBCUTANEOUS
  Administered 2013-02-15 (×2): 3 [IU] via SUBCUTANEOUS
  Administered 2013-02-15 – 2013-02-17 (×3): 4 [IU] via SUBCUTANEOUS
  Administered 2013-02-18 – 2013-02-19 (×2): 3 [IU] via SUBCUTANEOUS
  Administered 2013-02-19: 4 [IU] via SUBCUTANEOUS
  Administered 2013-02-20: 3 [IU] via SUBCUTANEOUS

## 2013-02-03 MED ORDER — ONDANSETRON HCL 4 MG PO TABS: 4.0000 mg | ORAL_TABLET | Freq: Four times a day (QID) | ORAL | Status: DC | PRN

## 2013-02-03 MED ORDER — ONE-DAILY MULTI VITAMINS PO TABS: 1.0000 | ORAL_TABLET | Freq: Every day | ORAL | Status: DC

## 2013-02-03 MED ORDER — VANCOMYCIN HCL 1000 MG IV SOLR
750.0000 mg | Freq: Two times a day (BID) | INTRAVENOUS | Status: DC
  Administered 2013-02-04 – 2013-02-06 (×6): 750 mg via INTRAVENOUS
  Filled 2013-02-03 (×7): qty 750

## 2013-02-03 MED ORDER — INSULIN GLULISINE 100 UNIT/ML ~~LOC~~ SOLN: 10.0000 [IU] | Freq: Three times a day (TID) | SUBCUTANEOUS | Status: DC

## 2013-02-03 MED ORDER — ACETAMINOPHEN 650 MG RE SUPP: 650.0000 mg | Freq: Four times a day (QID) | RECTAL | Status: DC | PRN

## 2013-02-03 MED ORDER — ATORVASTATIN CALCIUM 10 MG PO TABS
10.0000 mg | ORAL_TABLET | Freq: Every day | ORAL | Status: DC
  Administered 2013-02-04 – 2013-02-19 (×16): 10 mg via ORAL
  Filled 2013-02-03 (×17): qty 1
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
## 2013-02-03 NOTE — Progress Notes (Signed)
57 yo diabetic lady with cellulitis of right foot, hyperglycemia, being admitted to [facility] for IV antibiotic treatment.

## 2013-02-03 NOTE — ED Notes (Signed)
C/o "chills" last Wednesday.  Got up during the night and fell; she thinks she might have passed out.  C/o right hip pain down to right foot and left rib pain.  Has had difficulty walking on right foot since, and today is unable to fully bear weight.
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
## 2013-02-03 NOTE — ED Notes (Signed)
Report to [facility] via phone.
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
## 2013-02-03 NOTE — ED Provider Notes (Signed)
Medical screening examination/treatment/procedure(s) were conducted as a shared visit with non-physician practitioner(s) and myself.  I personally evaluated the patient during the encounter 57 yo diabetic lady with cellulitis of right foot, hyperglycemia, being admitted to [facility] for IV antibiotic treatment.       Mylinda Latina III, MD 02/03/13 [phone]

## 2013-02-03 NOTE — ED Notes (Signed)
Attempted to call report to Zacarias Pontes RN on 6N, RN unavailable for report and will call back.

## 2013-02-03 NOTE — Progress Notes (Signed)
Patient presented to  [facility] with infection, diabetic foot ulcer of right foot. States she has been on antibiotics per Dr. Lenn Sink who works at [facility]. Just finished anbx 3 days ago, presented with  CBG of 520, needs iv abx,and stabilize sugars Tele, MC10

## 2013-02-04 ENCOUNTER — Inpatient Hospital Stay (HOSPITAL_COMMUNITY): Payer: BC Managed Care – PPO

## 2013-02-04 ENCOUNTER — Encounter: Payer: Self-pay | Admitting: Family Medicine

## 2013-02-04 ENCOUNTER — Encounter (HOSPITAL_COMMUNITY): Payer: Self-pay | Admitting: General Practice

## 2013-02-04 DIAGNOSIS — R609 Edema, unspecified: Secondary | ICD-10-CM

## 2013-02-04 LAB — RETICULOCYTES
RBC.: 3.95 MIL/uL (ref 3.87–5.11)
Retic Count, Absolute: 94.8 10*3/uL (ref 19.0–186.0)

## 2013-02-04 LAB — GLUCOSE, CAPILLARY
Glucose-Capillary: 310 mg/dL — ABNORMAL HIGH (ref 70–99)
Glucose-Capillary: 263 mg/dL — ABNORMAL HIGH (ref 70–99)
Glucose-Capillary: 172 mg/dL — ABNORMAL HIGH (ref 70–99)

## 2013-02-04 LAB — BASIC METABOLIC PANEL
Calcium: 8.7 mg/dL (ref 8.4–10.5)
Creatinine, Ser: 0.86 mg/dL (ref 0.50–1.10)
GFR calc non Af Amer: 74 mL/min — ABNORMAL LOW (ref >90)
Sodium: 130 mEq/L — ABNORMAL LOW (ref 135–145)

## 2013-02-04 LAB — CBC
MCH: 27.1 pg (ref 26.0–34.0)
MCHC: 35.7 g/dL (ref 30.0–36.0)
Platelets: 228 10*3/uL (ref 150–400)
RDW: 13.7 % (ref 11.5–15.5)

## 2013-02-04 LAB — MAGNESIUM: Magnesium: 2.2 mg/dL (ref 1.5–2.5)

## 2013-02-04 LAB — IRON AND TIBC: Iron: 18 ug/dL — ABNORMAL LOW (ref 42–135)

## 2013-02-04 LAB — FOLATE: Folate: 9.4 ng/mL

## 2013-02-04 LAB — VITAMIN B12: Vitamin B-12: 1154 pg/mL — ABNORMAL HIGH (ref 211–911)

## 2013-02-04 MED ORDER — GADOBENATE DIMEGLUMINE 529 MG/ML IV SOLN
15.0000 mL | Freq: Once | INTRAVENOUS | Status: AC
  Administered 2013-02-04: 15 mL via INTRAVENOUS

## 2013-02-04 MED ORDER — PNEUMOCOCCAL VAC POLYVALENT 25 MCG/0.5ML IJ INJ
0.5000 mL | INJECTION | INTRAMUSCULAR | Status: AC
  Administered 2013-02-05: 0.5 mL via INTRAMUSCULAR
  Filled 2013-02-04: qty 0.5

## 2013-02-04 MED ORDER — SODIUM CHLORIDE 0.9 % IV SOLN
500.0000 mg | Freq: Three times a day (TID) | INTRAVENOUS | Status: AC
  Administered 2013-02-04 – 2013-02-11 (×23): 500 mg via INTRAVENOUS
  Filled 2013-02-04 (×23): qty 500

## 2013-02-04 NOTE — Progress Notes (Signed)
*  PRELIMINARY RESULTS* Vascular Ultrasound Lower extremity venous duplex has been completed.  Preliminary findings: Bilateral:  No evidence of DVT, superficial thrombosis, or Baker's Cyst.    Landry Mellow, RDMS, RVT  02/04/2013, 10:09 AM

## 2013-02-04 NOTE — Progress Notes (Addendum)
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 Patient initiated on vancomycin. Add imipenem Unfortunately blood cultures not drawn. Will get an MRI of the foot for further evaluation. Doppler was negative. Discussed with Dr. Ronnie Derby, ortho, to determine if the patient needs debridement.   Uncontrolled type 2 diabetes  Resistant sliding scale. Continue home Lantus. Hemoglobin A1c 14.0 on 01/10/2013, suggesting blood sugars have been uncontrolled at home. Further titration of insulin depending on patient's clinical course.   Hypokalemia and hyponatremia  Likely due to uncontrolled diabetes. Replace potassium as needed. Suspect hyponatremia is likely due to hyperosmolar hyperglycemic nonketotic state from uncontrolled diabetes. Now improving, Continue fluid resuscitation.   Dehydration  Likely due to uncontrolled diabetes. Continue IV fluids.  Anemia  Likely due to chronic disease. Check anemia panel in the morning.  Hypertension  Stable. Continue home meds hypertensive medications.  Hyperlipidemia  Stable.  Prophylaxis  Lovenox.  CODE STATUS  Full code.   Code Status: full Family Communication: family updated about patient's clinical progress Disposition Plan:  As above    Brief narrative: [name] [name] is a 57 y.o. Caucasian female with history of hypertension, uncontrolled diabetes, history of MI, and fibromyalgia who presents with  the above complaints. She reports that in December of 2014 she had a fall and had noted ulcers on her right foot. She has seen an outpatient physician for her foot but the ulcers on her foot had been getting worse. She has had intermittent episodes of antibiotics without any significant improvement. She has seen her primary care physician for uncontrolled diabetes and her insulin regimen is being adjusted. She presented to the ER with complaints of right hip pain and right leg pain. She has had a fall on 01/29/2013, since then she has had increasing right hip and right leg pain. Imaging the ER was negative for any fractures or abnormality in her hip. Her right foot had 2 significant ulcers with erythema as a result she was referred to the hospitalist service for admission for further care and management. She has felt feverish at home but has not checked her temperature. Denies any nausea and vomiting. Does complain of left-sided chest pain which is worse with palpation. Denies any difficulty breathing. Denies any diarrhea. Denies any headaches or vision changes   Consultants:  Orthopedics  Procedures:  None  Antibiotics:  Vancomycin  HPI/Subjective: Complaints  Objective: Filed Vitals:   02/03/13 2055 02/03/13 2229 02/03/13 2240 02/04/13 0538  BP:  102/45 98/56 102/64  Pulse:  98  93  Temp:  99.3 F (37.4 C)  98.7 F (37.1 C)  TempSrc:  Oral  Oral  Resp:  18  18  Height: 4' 11.84" (1.52 m)     Weight: 69.9 kg (154 lb 1.6 oz)     SpO2:  93%  94%    Intake/Output Summary (Last 24 hours) at 02/04/13 1118 Last data filed at 02/04/13 0600  Gross per 24 hour  Intake    937 ml  Output      0 ml  Net    937 ml    Exam:  Cardio: regular rate and rhythm, S1, S2 normal, no murmur, click, rub or gallop  GI: soft, non-tender; bowel sounds normal; no masses, no organomegaly  Extremities: extremities normal, atraumatic, no cyanosis or edema and Homans sign is negative, no sign of DVT   Pulses: 2+ and symmetric  Neurologic: Alert and oriented X 3, normal strength and tone. Normal symmetric reflexes. Normal coordination and gait  ZHG:DJME pulses. Trace edema. 2 ulcerated lesions in the right foot in the medial and lateral aspect, medial ulcer is about quarter-sized size and lateral ulcer is about 4-5 centimeters in size. Surrounding erythema on the dorsal aspect of the foot.    Data Reviewed: Basic Metabolic Panel:  Recent Labs Lab 02/03/13 1210 02/04/13 0606  NA 125* 130*  K 3.2* 3.6  CL 81* 90*  CO2 31 30  GLUCOSE 520* 290*  BUN 20 15  CREATININE 0.90 0.86  CALCIUM 9.3 8.7  MG  --  2.2    Liver Function Tests: No results found for this basename: AST, ALT, ALKPHOS, BILITOT, PROT, ALBUMIN,  in the last 168 hours No results found for this basename: LIPASE, AMYLASE,  in the last 168 hours No results found for this basename: AMMONIA,  in the last 168 hours  CBC:  Recent Labs Lab 02/03/13 1210 02/04/13 0606  WBC 9.6 10.9*  NEUTROABS 7.8*  --   HGB 11.8* 10.7*  HCT 33.7* 30.0*  MCV 77.8* 75.9*  PLT 225 228    Cardiac Enzymes: No results found for this basename: CKTOTAL, CKMB, CKMBINDEX, TROPONINI,  in the last 168 hours BNP (last 3 results) No results found for this basename: PROBNP,  in the last 8760 hours   CBG:  Recent Labs Lab 02/03/13 1620 02/03/13 2225 02/04/13 0748  GLUCAP 293* 310* 263*    No results found for this or any previous visit (from the past 240 hour(s)).   Studies: Dg Chest 2 View  02/03/2013  *RADIOLOGY REPORT*  Clinical Data: Fall with left-sided rib pain.  CHEST - 2 VIEW  Comparison: 10/15/2012  Findings: Chest x-ray shows mild bibasilar atelectasis.  No pneumothorax, focal consolidation or pleural fluid is identified. The heart size is normal.  Visualized bony structures show no obvious acute rib fractures.  There is chronic deformity of the proximal left humerus related to prior fracture.  IMPRESSION: Mild bibasilar  atelectasis.   Original Report Authenticated By: Aletta Edouard, M.D.    Dg Hip Complete Right  02/03/2013  *RADIOLOGY REPORT*  Clinical Data: Fall with right hip pain.  RIGHT HIP - COMPLETE 2+ VIEW  Comparison:  None.  Findings:  There is no evidence of hip fracture or dislocation. There is no evidence of arthropathy or other focal bone abnormality. The bony pelvis is intact.  IMPRESSION: Negative.   Original Report Authenticated By: Aletta Edouard, M.D.    Ct Abdomen Pelvis W Contrast  02/03/2013  *RADIOLOGY REPORT*  Clinical Data: Fall 5 days ago.  Right lower quadrant pain. Nausea.  History of "liver cancer."  Diabetes.  Hypertension.  CT ABDOMEN AND PELVIS WITH CONTRAST  Technique:  Multidetector CT imaging of the abdomen and pelvis was performed following the standard protocol during bolus administration of intravenous contrast.  Contrast: 1 OMNIPAQUE IOHEXOL 300 MG/ML  SOLN, 135m OMNIPAQUE IOHEXOL 300 MG/ML  SOLN  Comparison: 07/06/2010  Findings: Lung bases:  Clear lung bases.  Mild cardiomegaly.  A tiny hiatal hernia. Prominent right cardiophrenic angle node on image 10/series 2 is chronic and reactive.  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  Prominent left portal vein on image 18/series 2, similar to 06/04/2007.  Persistent splenomegaly, with the spleen measuring 13.7 cm cranial caudal.  Normal stomach, pancreas. Cholecystectomy without biliary ductal dilatation.  Normal adrenal glands.  Bilateral heterogeneous renal enhancement, especially on the delayed series seven.  Mildly prominent retroperitoneal nodes which are likely reactive and are unchanged.  Colonic stool burden suggests constipation.  Normal terminal ileum and appendix.  No pneumatosis or free intraperitoneal air.  Normal small bowel without abdominal ascites.  1.3 cm right external iliac node on image 84/series 2 is new since 07/06/2010.  There are also prominent right  inguinal nodes which are newly enlarged.  Normal urinary bladder.  Hysterectomy. No significant free fluid.  Bones/Musculoskeletal:  No acute osseous abnormality.  IMPRESSION:  1.  No acute post-traumatic deformity identified. Heterogeneous renal enhancement is suspicious for bilateral pyelonephritis. 2.  Moderate cirrhosis with persistent splenomegaly. 3. Possible constipation. 4.  Normal appendix. 5.  Mild adenopathy in the right external iliac station with prominent right inguinal nodes.  Favored to be reactive.  Not in the typical drainage pattern for liver cancer metastasis.  Consider physical exam correlation.   Original Report Authenticated By: Abigail Miyamoto, M.D.    Dg Foot Complete Right  02/03/2013  *RADIOLOGY REPORT*  Clinical Data: Diabetic foot.  Redness and swelling.  Lateral sore on bottom of foot at  head of first metatarsal.  RIGHT FOOT COMPLETE - 3+ VIEW  Comparison: None.  Findings: Minimal hallux valgus deformity.  There may be soft tissue swelling about the medial aspect of the first metatarsal phalangeal joint.  No osseous destruction.  Achilles and calcaneal spurs.  Probable dorsal forefoot soft tissue swelling as well.  No soft tissue gas or radiopaque foreign object.  Possible skin/soft tissue ulcer about the fifth metatarsal phalangeal joint, most apparent on the oblique view.  IMPRESSION: No acute osseous abnormality.  Possible soft tissue defect about the lateral aspect of the fifth MTP joint.  Relatively diffuse soft tissue swelling about the forefoot.   Original Report Authenticated By: Abigail Miyamoto, M.D.     Scheduled Meds: . atorvastatin  10 mg Oral q1800  . calcium-vitamin D  1 tablet Oral Daily  . cholecalciferol  2,000 Units Oral Daily  . DULoxetine  60 mg Oral Daily  . enoxaparin (LOVENOX) injection  40 mg Subcutaneous Q24H  . escitalopram  10 mg Oral Daily  . gabapentin  600 mg Oral TID  . insulin aspart  0-20 Units Subcutaneous TID WC  . insulin aspart  0-5 Units  Subcutaneous QHS  . insulin aspart  10 Units Subcutaneous TID WC  . insulin glargine  40 Units Subcutaneous QHS  . metaxalone  800 mg Oral TID  . multivitamin with minerals  1 tablet Oral Daily  . [START ON 02/05/2013] pneumococcal 23 valent vaccine  0.5 mL Intramuscular Tomorrow-1000  . polyethylene glycol  17 g Oral Daily  . vancomycin (VANCOCIN) 750 mg IVPB  750 mg Intravenous Q12H  . vitamin C  500 mg Oral Daily   Continuous Infusions: . sodium chloride 100 mL/hr at 02/03/13 2219    Active Problems:   Type II or unspecified type diabetes mellitus with unspecified complication, uncontrolled   HYPERLIPIDEMIA   ANXIETY   HYPERTENSION  Cirrhosis of liver without mention of alcohol   Cellulitis and abscess of foot   Diabetic foot ulcer associated with type 2 diabetes mellitus   Hyponatremia   Hypokalemia   Anemia   Dehydration    Time spent: 40 minutes   [facility] Pager [phone]. If 8PM-8AM, please contact night-coverage at [email or web address], password [facility] 02/04/2013, 11:18 AM  LOS: 1 day

## 2013-02-04 NOTE — Progress Notes (Signed)
ANTIBIOTIC CONSULT NOTE - INITIAL  Pharmacy Consult:  Primaxin Indication:  Cellulitis  Allergies  Allergen Reactions  . Fish-Derived Products Hives, Shortness Of Breath, Swelling and Rash    Hives get in throat causing trouble breathing  . Mushroom Extract Complex Anaphylaxis  . Penicillins Anaphylaxis  . Tomato Hives and Shortness Of Breath    Hives in throat causes her trouble breathing    Patient Measurements: Height: 4' 11.84" (152 cm) Weight: 154 lb 1.6 oz (69.9 kg) IBW/kg (Calculated) : 45.14  Vital Signs: Temp: 98.7 F (37.1 C) (04/08 0538) Temp src: Oral (04/08 0538) BP: 102/64 mmHg (04/08 0538) Pulse Rate: 93 (04/08 0538) Intake/Output from previous day: 04/07 0701 - 04/08 0700 In: 937 [I.V.:937] Out: -  Intake/Output from this shift: Total I/O In: 240 [P.O.:240] Out: -   Labs:  Recent Labs  02/03/13 1210 02/04/13 0606  WBC 9.6 10.9*  HGB 11.8* 10.7*  PLT 225 228  CREATININE 0.90 0.86   Estimated Creatinine Clearance: 62.7 ml/min (by C-G formula based on Cr of 0.86). No results found for this basename: VANCOTROUGH, VANCOPEAK, VANCORANDOM, GENTTROUGH, GENTPEAK, GENTRANDOM, TOBRATROUGH, TOBRAPEAK, TOBRARND, AMIKACINPEAK, AMIKACINTROU, AMIKACIN,  in the last 72 hours   Microbiology: No results found for this or any previous visit (from the past 720 hour(s)).  Medical History: Past Medical History  Diagnosis Date  . Hypertension   . Fibromyalgia   . High cholesterol   . Acute MI 2006  . PTSD (post-traumatic stress disorder)   . Cancer   . Anginal pain   . Diabetes mellitus     insulin dependent  . Chronic kidney disease   . GERD (gastroesophageal reflux disease)         Assessment: 7 YOF started on vancomycin for foot ulcer/cellulitis and now to add Primaxin for broader coverage in patient with DM.  Patient with stable renal function.  Noted allergy to penicillin is anaphylaxis  Goal of Therapy:  Clearance of infection   Plan:  -  Primaxin 567m IV Q8H - Monitor renal fxn, clinical course, s/sx of allergic rxn to Primaxin - Consider iron supplementation     Y[place] Pacella D. DMina Marble PharmD, BCPS Pager:  [phone]/05/2013, 1:16 PM

## 2013-02-04 NOTE — Progress Notes (Signed)
OT Cancellation Note  Patient Details Name: [name] [name] MRN: [id number] DOB: 01/12/1956   Cancelled Treatment:    Reason Eval/Treat Not Completed: Patient at procedure or test/ unavailable(MRI)  [name] 02/04/2013, 3:28 PM
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
## 2013-02-04 NOTE — Evaluation (Signed)
Physical Therapy Evaluation Patient Details Name: [name] [name] MRN: [id number] DOB: Jan 02, 1956 Today's Date: 02/04/2013 Time: 9892-1194    PT Assessment / Plan / Recommendation Clinical Impression  Pt is a 57 yo female who presents with uncontolled DM and R foot ulcers. Pt's mobility is limited due to pain in right foot and posterior aspect of thigh/leg. Pt presents with decreased strength in RLE. Pt would benefit from skilled PT to maxamize mobility and Independence for retun home.     PT Assessment  Patient needs continued PT services    Follow Up Recommendations  No PT follow up;Home health PT (will update)    Does the patient have the potential to tolerate intense rehabilitation      Barriers to Discharge        Equipment Recommendations  Rolling walker with 5" wheels    Recommendations for Other Services     Frequency Min 3X/week    Precautions / Restrictions Restrictions Weight Bearing Restrictions: No   Pertinent Vitals/Pain      Mobility  Bed Mobility Bed Mobility: Supine to Sit;Sit to Supine Supine to Sit: 5: Supervision;HOB elevated;With rails Sit to Supine: 4: Min assist;With rail;HOB elevated Details for Bed Mobility Assistance: assistance with bil LE Transfers Transfers: Sit to Stand;Stand to Sit Sit to Stand: 5: Supervision Stand to Sit: 5: Supervision Details for Transfer Assistance: cues for technique with RW Ambulation/Gait Ambulation/Gait Assistance: 5: Supervision Ambulation Distance (Feet): 18 Feet Assistive device: Rolling walker Gait Pattern: Step-through pattern;Decreased stride length;Decreased weight shift to right;Decreased step length - left Gait velocity: decreased General Gait Details: mod cues for sequencing and cues to use UE to decrease WB on RLE Stairs: No    Exercises     PT Diagnosis: Difficulty walking;Acute pain  PT Problem List: Decreased strength;Decreased activity tolerance;Decreased mobility;Decreased safety  awareness PT Treatment Interventions: DME instruction;Gait training;Therapeutic activities;Therapeutic exercise;Balance training;Stair training;Patient/family education   PT Goals Acute Rehab PT Goals PT Goal Formulation: With patient Time For Goal Achievement: 02/18/13 Potential to Achieve Goals: Good Pt will go Supine/Side to Sit: Independently PT Goal: Supine/Side to Sit - Progress: Goal set today Pt will go Sit to Supine/Side: Independently PT Goal: Sit to Supine/Side - Progress: Goal set today Pt will go Sit to Stand: with modified independence PT Goal: Sit to Stand - Progress: Goal set today Pt will go Stand to Sit: with modified independence PT Goal: Stand to Sit - Progress: Goal set today Pt will Transfer Bed to Chair/Chair to Bed: with modified independence PT Transfer Goal: Bed to Chair/Chair to Bed - Progress: Goal set today Pt will Ambulate: with modified independence PT Goal: Ambulate - Progress: Goal set today  Visit Information  Last PT Received On: 02/04/13 Assistance Needed: +1    Subjective Data  Subjective: My right leg does not work well anymore. Patient Stated Goal: to return home.   Prior Functioning  Home Living Lives With: Spouse Available Help at Discharge: Family Home Access: Stairs to enter Technical brewer of Steps: 2 Entrance Stairs-Rails: None Home Layout: Two level Alternate Level Stairs-Number of Steps: 12 Alternate Level Stairs-Rails: Right Home Adaptive Equipment: None Prior Function Level of Independence: Independent Able to Take Stairs?: Reciprically Driving: Yes Vocation: Self employed Communication Communication: No difficulties    Cognition  Cognition Overall Cognitive Status: Appears within functional limits for tasks assessed/performed Arousal/Alertness: Awake/alert Orientation Level: Appears intact for tasks assessed Behavior During Session: Restless Cognition - Other Comments: restless due to pain level.     Extremity/Trunk  Assessment Right Lower Extremity Assessment RLE ROM/Strength/Tone: Deficits RLE ROM/Strength/Tone Deficits: c/o hip pain with active hip movment, hip/knee strength 3/5, ankle ROM WFL, ankle strength at least 3/5 foot with 2 ulcers - one on lateral aspect of plantar aspect of the foot and one on the first MT head on the plantar side of the foot. increased edema and redness noted. RLE Sensation: History of peripheral neuropathy Left Lower Extremity Assessment LLE ROM/Strength/Tone: WFL for tasks assessed Trunk Assessment Trunk Assessment: Normal   Balance Balance Balance Assessed: Yes Static Standing Balance Static Standing - Balance Support: Bilateral upper extremity supported Static Standing - Level of Assistance: 5: Stand by assistance  End of Session PT - End of Session Equipment Utilized During Treatment: Gait belt Activity Tolerance: Patient limited by pain Patient left: in bed;with call bell/phone within reach Nurse Communication: Mobility status  GP     Lelon Mast 02/04/2013, 12:37 PM

## 2013-02-05 ENCOUNTER — Inpatient Hospital Stay (HOSPITAL_COMMUNITY): Payer: BC Managed Care – PPO

## 2013-02-05 DIAGNOSIS — E871 Hypo-osmolality and hyponatremia: Secondary | ICD-10-CM

## 2013-02-05 DIAGNOSIS — E876 Hypokalemia: Secondary | ICD-10-CM

## 2013-02-05 DIAGNOSIS — M2550 Pain in unspecified joint: Secondary | ICD-10-CM

## 2013-02-05 DIAGNOSIS — E1149 Type 2 diabetes mellitus with other diabetic neurological complication: Secondary | ICD-10-CM

## 2013-02-05 LAB — GLUCOSE, CAPILLARY: Glucose-Capillary: 213 mg/dL — ABNORMAL HIGH (ref 70–99)

## 2013-02-05 MED ORDER — SODIUM CHLORIDE 0.9 % IV SOLN
INTRAVENOUS | Status: DC
  Administered 2013-02-05: 22:00:00 via INTRAVENOUS
  Administered 2013-02-07: 50 mL/h via INTRAVENOUS

## 2013-02-05 MED ORDER — HYDROCODONE-ACETAMINOPHEN 5-325 MG PO TABS
1.0000 | ORAL_TABLET | ORAL | Status: DC | PRN
  Administered 2013-02-05 – 2013-02-13 (×17): 1 via ORAL
  Filled 2013-02-05 (×17): qty 1

## 2013-02-05 NOTE — Progress Notes (Addendum)
[facility] PROGRESS NOTE  [name] [name] IDP:[id number] DOB: [birth year] DOA: 02/03/2013 PCP: Garnet Koyanagi, DO  Assessment/Plan: Active Problems:   Type II or unspecified type diabetes mellitus with unspecified complication, uncontrolled   HYPERLIPIDEMIA   ANXIETY   HYPERTENSION   Cirrhosis of liver without mention of alcohol   Cellulitis and abscess of foot   Diabetic foot ulcer associated with type 2 diabetes mellitus   Hyponatremia   Hypokalemia   Anemia   Dehydration    Diabetic foot ulcer on right lower extremity with cellulitis of the foot  -continue vancomycin and primaxin -MRI demonstrating abscess and tenosynovitis -will discuss with ortho for debridement  -continue PRN analgesics  Uncontrolled type 2 diabetes  -Resistant sliding scale. Continue home Lantus. Hemoglobin A1c 14.0 on 01/10/2013, suggesting blood sugars have been uncontrolled at home.  -Further titration of insulin depending on patient's clinical course.   Hypokalemia and hyponatremia  Likely due to uncontrolled diabetes.  Replace potassium as needed. Suspect hyponatremia is likely due to hyperosmolar hyperglycemic nonketotic state from uncontrolled diabetes. Now improving, Continue fluid resuscitation.  -BMET in am  Dehydration  Improved with IVF's. Good urine output Will start decreasing IVF  Anemia  Likely due to chronic disease. Follow anemia panel  Hypertension  Stable. Continue home anti-hypertensive medications.   Hyperlipidemia  Stable. Cont statins  Prophylaxis  Lovenox.   Code Status: full Family Communication:No family at bedside Disposition Plan:  To be detrmine    Brief narrative: [name] [name] is a 57 y.o. Caucasian female with history of hypertension, uncontrolled diabetes, history of MI, and fibromyalgia who presents with the above complaints. She reports that in December of 2014 she had a fall and had noted ulcers on her right foot. She has seen an outpatient  physician for her foot but the ulcers on her foot had been getting worse. She has had intermittent episodes of antibiotics without any significant improvement. She has seen her primary care physician for uncontrolled diabetes and her insulin regimen is being adjusted. She presented to the ER with complaints of right hip pain and right leg pain. She has had a fall on 01/29/2013, since then she has had increasing right hip and right leg pain. Imaging the ER was negative for any fractures or abnormality in her hip. Her right foot had 2 significant ulcers with erythema as a result she was referred to the hospitalist service for admission for further care and management. She has felt feverish at home but has not checked her temperature. Denies any nausea and vomiting. Does complain of left-sided chest pain which is worse with palpation. Denies any difficulty breathing. Denies any diarrhea. Denies any headaches or vision changes   Consultants:  Orthopedics  Procedures:  See below for x-ray results  Antibiotics:  Vancomycin  primaxin  HPI/Subjective: Complaining of right hip pain, also with pain on her right foot. MRI r/o osteomyelitis but with concerns for abscess and tenosynovitis.  Objective: Filed Vitals:   02/04/13 2205 02/04/13 2336 02/05/13 0602 02/05/13 1333  BP: 105/59  103/59 89/57  Pulse: 100  96 88  Temp: 98.5 F (36.9 C)  98.2 F (36.8 C) 98.2 F (36.8 C)  TempSrc: Oral  Oral Oral  Resp: 18  16 16   Height:      Weight:   70.2 kg (154 lb 12.2 oz)   SpO2: 84% 92% 93% 92%    Intake/Output Summary (Last 24 hours) at 02/05/13 1822 Last data filed at 02/05/13 0900  Gross per  24 hour  Intake   1753 ml  Output      2 ml  Net   1751 ml    Exam: GEN: AAOX3, no fever, complaining of right hip and right foot pain Cardio: regular rate and rhythm, S1, S2 normal, no murmur, rubs or gallops Lungs: CTA bilaterally GI: soft, non-tender; bowel sounds normal; no masses, no  organomegaly  Extremities: RLE with Good pulses. Trace edema. 2 ulcerated lesions in the right foot in the medial and lateral aspect, medial ulcer is about quarter-sized size and lateral ulcer is about 4-5 centimeters in size. Surrounding erythema on the dorsal aspect of the foot noted.   Data Reviewed: Basic Metabolic Panel:  Recent Labs Lab 02/03/13 1210 02/04/13 0606  NA 125* 130*  K 3.2* 3.6  CL 81* 90*  CO2 31 30  GLUCOSE 520* 290*  BUN 20 15  CREATININE 0.90 0.86  CALCIUM 9.3 8.7  MG  --  2.2    CBC:  Recent Labs Lab 02/03/13 1210 02/04/13 0606  WBC 9.6 10.9*  NEUTROABS 7.8*  --   HGB 11.8* 10.7*  HCT 33.7* 30.0*  MCV 77.8* 75.9*  PLT 225 228    CBG:  Recent Labs Lab 02/04/13 1720 02/04/13 2141 02/05/13 0756 02/05/13 1157 02/05/13 1716  GLUCAP 188* 185* 213* 213* 159*    Studies: Dg Chest 2 View  02/03/2013  *RADIOLOGY REPORT*  Clinical Data: Fall with left-sided rib pain.  CHEST - 2 VIEW  Comparison: 10/15/2012  Findings: Chest x-ray shows mild bibasilar atelectasis.  No pneumothorax, focal consolidation or pleural fluid is identified. The heart size is normal.  Visualized bony structures show no obvious acute rib fractures.  There is chronic deformity of the proximal left humerus related to prior fracture.  IMPRESSION: Mild bibasilar atelectasis.   Original Report Authenticated By: Aletta Edouard, M.D.    Dg Hip Complete Right  02/03/2013  *RADIOLOGY REPORT*  Clinical Data: Fall with right hip pain.  RIGHT HIP - COMPLETE 2+ VIEW  Comparison:  None.  Findings:  There is no evidence of hip fracture or dislocation. There is no evidence of arthropathy or other focal bone abnormality. The bony pelvis is intact.  IMPRESSION: Negative.   Original Report Authenticated By: Aletta Edouard, M.D.    Ct Abdomen Pelvis W Contrast  02/03/2013  *RADIOLOGY REPORT*  Clinical Data: Fall 5 days ago.  Right lower quadrant pain. Nausea.  History of "liver cancer."  Diabetes.   Hypertension.  CT ABDOMEN AND PELVIS WITH CONTRAST  Technique:  Multidetector CT imaging of the abdomen and pelvis was performed following the standard protocol during bolus administration of intravenous contrast.  Contrast: 1 OMNIPAQUE IOHEXOL 300 MG/ML  SOLN, 150m OMNIPAQUE IOHEXOL 300 MG/ML  SOLN  Comparison: 07/06/2010  Findings: Lung bases:  Clear lung bases.  Mild cardiomegaly.  A tiny hiatal hernia. Prominent right cardiophrenic angle node on image 10/series 2 is chronic and reactive.  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  Prominent left portal vein on image 18/series 2, similar to 06/04/2007.  Persistent splenomegaly, with the spleen measuring 13.7 cm cranial caudal.  Normal stomach, pancreas. Cholecystectomy without biliary ductal dilatation.  Normal adrenal glands.  Bilateral heterogeneous renal enhancement, especially on the delayed series seven.  Mildly prominent retroperitoneal nodes which are likely reactive and are unchanged.  Colonic stool burden suggests constipation.  Normal terminal ileum and appendix.  No pneumatosis or free intraperitoneal air.  Normal small bowel  without abdominal ascites.  1.3 cm right external iliac node on image 84/series 2 is new since 07/06/2010.  There are also prominent right inguinal nodes which are newly enlarged.  Normal urinary bladder.  Hysterectomy. No significant free fluid.  Bones/Musculoskeletal:  No acute osseous abnormality.  IMPRESSION:  1.  No acute post-traumatic deformity identified. Heterogeneous renal enhancement is suspicious for bilateral pyelonephritis. 2.  Moderate cirrhosis with persistent splenomegaly. 3. Possible constipation. 4.  Normal appendix. 5.  Mild adenopathy in the right external iliac station with prominent right inguinal nodes.  Favored to be reactive.  Not in the typical drainage pattern for liver cancer metastasis.  Consider physical exam correlation.   Original  Report Authenticated By: Abigail Miyamoto, M.D.    Dg Foot Complete Right  02/03/2013  *RADIOLOGY REPORT*  Clinical Data: Diabetic foot.  Redness and swelling.  Lateral sore on bottom of foot at  head of first metatarsal.  RIGHT FOOT COMPLETE - 3+ VIEW  Comparison: None.  Findings: Minimal hallux valgus deformity.  There may be soft tissue swelling about the medial aspect of the first metatarsal phalangeal joint.  No osseous destruction.  Achilles and calcaneal spurs.  Probable dorsal forefoot soft tissue swelling as well.  No soft tissue gas or radiopaque foreign object.  Possible skin/soft tissue ulcer about the fifth metatarsal phalangeal joint, most apparent on the oblique view.  IMPRESSION: No acute osseous abnormality.  Possible soft tissue defect about the lateral aspect of the fifth MTP joint.  Relatively diffuse soft tissue swelling about the forefoot.   Original Report Authenticated By: Abigail Miyamoto, M.D.     Scheduled Meds: . atorvastatin  10 mg Oral q1800  . calcium-vitamin D  1 tablet Oral Daily  . cholecalciferol  2,000 Units Oral Daily  . DULoxetine  60 mg Oral Daily  . enoxaparin (LOVENOX) injection  40 mg Subcutaneous Q24H  . escitalopram  10 mg Oral Daily  . gabapentin  600 mg Oral TID  . imipenem-cilastatin  500 mg Intravenous Q8H  . insulin aspart  0-20 Units Subcutaneous TID WC  . insulin aspart  0-5 Units Subcutaneous QHS  . insulin aspart  10 Units Subcutaneous TID WC  . insulin glargine  40 Units Subcutaneous QHS  . metaxalone  800 mg Oral TID  . multivitamin with minerals  1 tablet Oral Daily  . polyethylene glycol  17 g Oral Daily  . vancomycin (VANCOCIN) 750 mg IVPB  750 mg Intravenous Q12H  . vitamin C  500 mg Oral Daily   Continuous Infusions: . sodium chloride 100 mL/hr at 02/05/13 1243    Active Problems:   Type II or unspecified type diabetes mellitus with unspecified complication, uncontrolled   HYPERLIPIDEMIA   ANXIETY   HYPERTENSION   Cirrhosis of liver  without mention of alcohol   Cellulitis and abscess of foot   Diabetic foot ulcer associated with type 2 diabetes mellitus   Hyponatremia   Hypokalemia   Anemia   Dehydration    Time spent: >30 minutes   Christyn Gutkowski  [facility] Pager [phone]. If 8PM-8AM, please contact night-coverage at [email or web address], password [email or web address] 02/05/2013, 6:22 PM  LOS: 2 days

## 2013-02-06 DIAGNOSIS — M79609 Pain in unspecified limb: Secondary | ICD-10-CM

## 2013-02-06 LAB — CBC
HCT: 32.7 % — ABNORMAL LOW (ref 36.0–46.0)
Hemoglobin: 11 g/dL — ABNORMAL LOW (ref 12.0–15.0)
MCHC: 33.6 g/dL (ref 30.0–36.0)

## 2013-02-06 LAB — BASIC METABOLIC PANEL
BUN: 11 mg/dL (ref 6–23)
Chloride: 96 mEq/L (ref 96–112)
GFR calc Af Amer: >90 mL/min (ref >90)
Potassium: 4.3 mEq/L (ref 3.5–5.1)

## 2013-02-06 LAB — GLUCOSE, CAPILLARY
Glucose-Capillary: 210 mg/dL — ABNORMAL HIGH (ref 70–99)
Glucose-Capillary: 158 mg/dL — ABNORMAL HIGH (ref 70–99)
Glucose-Capillary: 175 mg/dL — ABNORMAL HIGH (ref 70–99)
Glucose-Capillary: 233 mg/dL — ABNORMAL HIGH (ref 70–99)

## 2013-02-06 MED ORDER — INSULIN GLARGINE 100 UNIT/ML ~~LOC~~ SOLN
45.0000 [IU] | Freq: Every day | SUBCUTANEOUS | Status: DC
Start: 2013-02-06 — End: 2013-02-20
  Administered 2013-02-06 – 2013-02-19 (×13): 45 [IU] via SUBCUTANEOUS
  Filled 2013-02-06 (×17): qty 0.45

## 2013-02-06 MED ORDER — CLINDAMYCIN PHOSPHATE 900 MG/50ML IV SOLN
900.0000 mg | INTRAVENOUS | Status: AC
  Filled 2013-02-06: qty 50

## 2013-02-06 NOTE — Evaluation (Signed)
Occupational Therapy Evaluation Patient Details Name: [name] [name] MRN: [id number] DOB: 1956-04-24 Today's Date: 02/06/2013 Time: 0354-6568 OT Time Calculation (min): 43 min  OT Assessment / Plan / Recommendation Clinical Impression  Pt is a 57 yo female admitted for uncontrolled diabetes with R LE pain and sore on R foot who would benefit from OT to address the deficits listed below.  Pt should be able to d/c home w/o OT follow up.    OT Assessment  Patient needs continued OT Services    Follow Up Recommendations  No OT follow up    Barriers to Discharge Decreased caregiver support husband works days.  Pt will be home alone.  Equipment Recommendations  None recommended by OT    Recommendations for Other Services    Frequency  Min 2X/week    Precautions / Restrictions Precautions Precautions: None Restrictions Weight Bearing Restrictions: No   Pertinent Vitals/Pain Pt with pain of 7/10.  Nurse gave pt pain meds during tx.    ADL  Eating/Feeding: Performed;Independent Where Assessed - Eating/Feeding: Chair Grooming: Performed;Wash/dry face;Wash/dry hands;Teeth care;Brushing hair;Denture care;Supervision/safety Where Assessed - Grooming: Supported standing Upper Body Bathing: Simulated;Set up Where Assessed - Upper Body Bathing: Unsupported sitting Lower Body Bathing: Simulated;Min guard Where Assessed - Lower Body Bathing: Supported sit to stand Upper Body Dressing: Simulated;Set up Where Assessed - Upper Body Dressing: Unsupported sitting Lower Body Dressing: Performed;Minimal assistance Where Assessed - Lower Body Dressing: Supported sit to stand Toilet Transfer: Performed;Min guard Armed forces technical officer Method: Sit to stand;Stand pivot Science writer: Comfort height toilet;Grab bars Toileting - Water quality scientist and Hygiene: Performed;Set up Where Assessed - Toileting Clothing Manipulation and Hygiene: Sit on 3-in-1 or toilet Equipment Used: Rolling  walker Transfers/Ambulation Related to ADLs: Pt walked in room with min guard.  Pt does fairly well with adls requiring cues for safe transfers. ADL Comments: Pt needs assist with RLE due to pain.    OT Diagnosis: Generalized weakness;Acute pain  OT Problem List: Decreased strength;Decreased safety awareness;Decreased knowledge of use of DME or AE;Pain OT Treatment Interventions: Self-care/ADL training;Therapeutic activities   OT Goals Acute Rehab OT Goals OT Goal Formulation: With patient Time For Goal Achievement: 02/13/13 Potential to Achieve Goals: Good ADL Goals Pt Will Perform Lower Body Dressing: with modified independence;Sit to stand from chair ADL Goal: Lower Body Dressing - Progress: Goal set today Pt Will Perform Tub/Shower Transfer: Shower transfer;with modified independence;Shower seat without back ADL Goal: Clinical cytogeneticist - Progress: Goal set today Additional ADL Goal #1: pt will complete all aspects of toileting on regular commode with mod I. ADL Goal: Additional Goal #1 - Progress: Goal set today  Visit Information  Last OT Received On: 02/06/13 Assistance Needed: +1    Subjective Data  Subjective: "My R leg hurts so much." Patient Stated Goal: to be able to work again.   Prior Functioning     Home Living Lives With: Spouse Available Help at Discharge: Family Type of Home: House Home Access: Stairs to enter Technical brewer of Steps: 2 Entrance Stairs-Rails: None Home Layout: Two level Alternate Level Stairs-Number of Steps: 12 Alternate Level Stairs-Rails: Right Bathroom Shower/Tub: Multimedia programmer: Standard Home Adaptive Equipment: None Prior Function Level of Independence: Independent Able to Take Stairs?: Reciprically Driving: Yes Vocation: Self employed Comments: Programmer, applications Communication: No difficulties Dominant Hand: Right         Vision/Perception Vision - History Baseline Vision: No  visual deficits Patient Visual Report: No change from baseline Vision - Assessment  Vision Assessment: Vision not tested   Cognition  Cognition Overall Cognitive Status: Appears within functional limits for tasks assessed/performed Arousal/Alertness: Awake/alert Orientation Level: Oriented X4 / Intact Behavior During Session: [facility] for tasks performed    Extremity/Trunk Assessment Right Upper Extremity Assessment RUE ROM/Strength/Tone: Within functional levels RUE Sensation: WFL - Light Touch RUE Coordination: WFL - gross/fine motor Left Upper Extremity Assessment LUE ROM/Strength/Tone: Within functional levels LUE Sensation: WFL - Light Touch LUE Coordination: WFL - gross/fine motor Trunk Assessment Trunk Assessment: Normal     Mobility Bed Mobility Bed Mobility: Supine to Sit Supine to Sit: 5: Supervision;HOB elevated;With rails Details for Bed Mobility Assistance: pt able to get legs off EOB w/o assist today. Transfers Transfers: Sit to Stand;Stand to Sit Sit to Stand: 5: Supervision Stand to Sit: 5: Supervision Details for Transfer Assistance: cues for technique with RW     Exercise     Balance Balance Balance Assessed: Yes Static Standing Balance Static Standing - Balance Support: Bilateral upper extremity supported Static Standing - Level of Assistance: 5: Stand by assistance   End of Session OT - End of Session Activity Tolerance: Patient tolerated treatment well Patient left: in chair;with call bell/phone within reach Nurse Communication: Other (comment) (pt's phone missing.)  GO     Glenford Peers 02/06/2013, 10:23 AM [phone]

## 2013-02-06 NOTE — Progress Notes (Signed)
[facility] PROGRESS NOTE  [name] [name] FVC:[id number] DOB: Mar 18, 1956 DOA: 02/03/2013 PCP: Garnet Koyanagi, DO  Assessment/Plan: Active Problems:   Type II or unspecified type diabetes mellitus with unspecified complication, uncontrolled   HYPERLIPIDEMIA   ANXIETY   HYPERTENSION   Cirrhosis of liver without mention of alcohol   Cellulitis and abscess of foot   Diabetic foot ulcer associated with type 2 diabetes mellitus   Hyponatremia   Hypokalemia   Anemia   Dehydration    Diabetic foot ulcer on right lower extremity with cellulitis of the foot  -continue vancomycin and primaxin -MRI demonstrating abscess and tenosynovitis; concerns that antibiotics alone might not be enough at this point. -Ortho will see patient today in order to decide on inpatient debridement -continue PRN analgesics  Uncontrolled type 2 diabetes  -Resistant sliding scale. Continue home Lantus. Hemoglobin A1c 14.0 on 01/10/2013, suggesting blood sugars have been uncontrolled at home.  -CBG's fasting continue to be elevated; will increase lantus to 45 units.  Hypokalemia and hyponatremia  Likely due to uncontrolled diabetes.  Replace potassium as needed. Suspect hyponatremia is likely due to hyperosmolar hyperglycemic nonketotic state from uncontrolled diabetes. Now improving, Continue fluid resuscitation.  -Sodium 130 and potassium repleted. -continue monitoring and repletion as needed  Dehydration  Improved with IVF's. Good urine output Will change IVF to NSL  Anemia  Likely due to chronic disease. Anemia panel corroborates diagnosis of AOCD. Will monitor Hgb   Hypertension  Stable. Continue low sodium diet.  Neuropathy: -continue gabapentin  Hyperlipidemia  Stable. Cont statins  Prophylaxis  Lovenox.   Code Status: full Family Communication:No family at bedside Disposition Plan:  To be detrmine    Brief narrative: [name] [name] is a 57 y.o. Caucasian female with history of  hypertension, uncontrolled diabetes, history of MI, and fibromyalgia who presents with the above complaints. She reports that in December of 2014 she had a fall and had noted ulcers on her right foot. She has seen an outpatient physician for her foot but the ulcers on her foot had been getting worse. She has had intermittent episodes of antibiotics without any significant improvement. She has seen her primary care physician for uncontrolled diabetes and her insulin regimen is being adjusted. She presented to the ER with complaints of right hip pain and right leg pain. She has had a fall on 01/29/2013, since then she has had increasing right hip and right leg pain. Imaging the ER was negative for any fractures or abnormality in her hip. Her right foot had 2 significant ulcers with erythema as a result she was referred to the hospitalist service for admission for further care and management. She has felt feverish at home but has not checked her temperature. Denies any nausea and vomiting. Does complain of left-sided chest pain which is worse with palpation. Denies any difficulty breathing. Denies any diarrhea. Denies any headaches or vision changes   Consultants:  Orthopedics  Procedures:  See below for x-ray results  Antibiotics:  Vancomycin  primaxin  HPI/Subjective: Still ongoing pain on her right foot and right leg. Hip x-ray negative.   Objective: Filed Vitals:   02/06/13 0503 02/06/13 0900 02/06/13 1310 02/06/13 1545  BP: 123/73  109/55   Pulse: 91  84   Temp: 98.3 F (36.8 C)  99 F (37.2 C)   TempSrc: Oral  Oral   Resp: 18  18   Height:      Weight: 69.9 kg (154 lb 1.6 oz)   73.6  kg (162 lb 4.1 oz)  SpO2: 91% 97% 93%     Intake/Output Summary (Last 24 hours) at 02/06/13 1634 Last data filed at 02/06/13 0600  Gross per 24 hour  Intake 2801.25 ml  Output      0 ml  Net 2801.25 ml    Exam: GEN: AAOX3, no fever, complaining of right hip and right foot pain Cardio:  regular rate and rhythm, S1, S2 normal, no murmur, rubs or gallops Lungs: CTA bilaterally GI: soft, non-tender; bowel sounds normal; no masses, no organomegaly  Extremities: RLE with Good pulses. Trace edema. 2 ulcerated lesions in the right foot in the medial and lateral aspect, medial ulcer is about quarter-sized size and lateral ulcer is about 4-5 centimeters in size. Surrounding erythema on the dorsal aspect of the foot noted.   Data Reviewed: Basic Metabolic Panel:  Recent Labs Lab 02/03/13 1210 02/04/13 0606 02/06/13 0520  NA 125* 130* 132*  K 3.2* 3.6 4.3  CL 81* 90* 96  CO2 31 30 30   GLUCOSE 520* 290* 222*  BUN 20 15 11   CREATININE 0.90 0.86 0.81  CALCIUM 9.3 8.7 8.6  MG  --  2.2  --     CBC:  Recent Labs Lab 02/03/13 1210 02/04/13 0606 02/06/13 0520  WBC 9.6 10.9* 12.3*  NEUTROABS 7.8*  --   --   HGB 11.8* 10.7* 11.0*  HCT 33.7* 30.0* 32.7*  MCV 77.8* 75.9* 78.4  PLT 225 228 264    CBG:  Recent Labs Lab 02/05/13 1157 02/05/13 1716 02/05/13 2138 02/06/13 0727 02/06/13 1206  GLUCAP 213* 159* 157* 210* 158*    Studies: Dg Chest 2 View  02/03/2013  *RADIOLOGY REPORT*  Clinical Data: Fall with left-sided rib pain.  CHEST - 2 VIEW  Comparison: 10/15/2012  Findings: Chest x-ray shows mild bibasilar atelectasis.  No pneumothorax, focal consolidation or pleural fluid is identified. The heart size is normal.  Visualized bony structures show no obvious acute rib fractures.  There is chronic deformity of the proximal left humerus related to prior fracture.  IMPRESSION: Mild bibasilar atelectasis.   Original Report Authenticated By: Aletta Edouard, M.D.    Dg Hip Complete Right  02/03/2013  *RADIOLOGY REPORT*  Clinical Data: Fall with right hip pain.  RIGHT HIP - COMPLETE 2+ VIEW  Comparison:  None.  Findings:  There is no evidence of hip fracture or dislocation. There is no evidence of arthropathy or other focal bone abnormality. The bony pelvis is intact.   IMPRESSION: Negative.   Original Report Authenticated By: Aletta Edouard, M.D.    Ct Abdomen Pelvis W Contrast  02/03/2013  *RADIOLOGY REPORT*  Clinical Data: Fall 5 days ago.  Right lower quadrant pain. Nausea.  History of "liver cancer."  Diabetes.  Hypertension.  CT ABDOMEN AND PELVIS WITH CONTRAST  Technique:  Multidetector CT imaging of the abdomen and pelvis was performed following the standard protocol during bolus administration of intravenous contrast.  Contrast: 1 OMNIPAQUE IOHEXOL 300 MG/ML  SOLN, 126m OMNIPAQUE IOHEXOL 300 MG/ML  SOLN  Comparison: 07/06/2010  Findings: Lung bases:  Clear lung bases.  Mild cardiomegaly.  A tiny hiatal hernia. Prominent right cardiophrenic angle node on image 10/series 2 is chronic and reactive.  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  Prominent left portal vein on image 18/series 2, similar to 06/04/2007.  Persistent splenomegaly, with the spleen measuring 13.7 cm cranial caudal.  Normal stomach, pancreas. Cholecystectomy without biliary ductal dilatation.  Normal adrenal glands.  Bilateral heterogeneous renal enhancement, especially on the delayed series seven.  Mildly prominent retroperitoneal nodes which are likely reactive and are unchanged.  Colonic stool burden suggests constipation.  Normal terminal ileum and appendix.  No pneumatosis or free intraperitoneal air.  Normal small bowel without abdominal ascites.  1.3 cm right external iliac node on image 84/series 2 is new since 07/06/2010.  There are also prominent right inguinal nodes which are newly enlarged.  Normal urinary bladder.  Hysterectomy. No significant free fluid.  Bones/Musculoskeletal:  No acute osseous abnormality.  IMPRESSION:  1.  No acute post-traumatic deformity identified. Heterogeneous renal enhancement is suspicious for bilateral pyelonephritis. 2.  Moderate cirrhosis with persistent splenomegaly. 3. Possible constipation. 4.   Normal appendix. 5.  Mild adenopathy in the right external iliac station with prominent right inguinal nodes.  Favored to be reactive.  Not in the typical drainage pattern for liver cancer metastasis.  Consider physical exam correlation.   Original Report Authenticated By: Abigail Miyamoto, M.D.    Dg Foot Complete Right  02/03/2013  *RADIOLOGY REPORT*  Clinical Data: Diabetic foot.  Redness and swelling.  Lateral sore on bottom of foot at  head of first metatarsal.  RIGHT FOOT COMPLETE - 3+ VIEW  Comparison: None.  Findings: Minimal hallux valgus deformity.  There may be soft tissue swelling about the medial aspect of the first metatarsal phalangeal joint.  No osseous destruction.  Achilles and calcaneal spurs.  Probable dorsal forefoot soft tissue swelling as well.  No soft tissue gas or radiopaque foreign object.  Possible skin/soft tissue ulcer about the fifth metatarsal phalangeal joint, most apparent on the oblique view.  IMPRESSION: No acute osseous abnormality.  Possible soft tissue defect about the lateral aspect of the fifth MTP joint.  Relatively diffuse soft tissue swelling about the forefoot.   Original Report Authenticated By: Abigail Miyamoto, M.D.     Scheduled Meds: . atorvastatin  10 mg Oral q1800  . calcium-vitamin D  1 tablet Oral Daily  . cholecalciferol  2,000 Units Oral Daily  . DULoxetine  60 mg Oral Daily  . enoxaparin (LOVENOX) injection  40 mg Subcutaneous Q24H  . escitalopram  10 mg Oral Daily  . gabapentin  600 mg Oral TID  . imipenem-cilastatin  500 mg Intravenous Q8H  . insulin aspart  0-20 Units Subcutaneous TID WC  . insulin aspart  0-5 Units Subcutaneous QHS  . insulin aspart  10 Units Subcutaneous TID WC  . insulin glargine  45 Units Subcutaneous QHS  . metaxalone  800 mg Oral TID  . multivitamin with minerals  1 tablet Oral Daily  . polyethylene glycol  17 g Oral Daily  . vancomycin (VANCOCIN) 750 mg IVPB  750 mg Intravenous Q12H  . vitamin C  500 mg Oral Daily    Continuous Infusions: . sodium chloride 75 mL/hr at 02/05/13 2203    Active Problems:   Type II or unspecified type diabetes mellitus with unspecified complication, uncontrolled   HYPERLIPIDEMIA   ANXIETY   HYPERTENSION   Cirrhosis of liver without mention of alcohol   Cellulitis and abscess of foot   Diabetic foot ulcer associated with type 2 diabetes mellitus   Hyponatremia   Hypokalemia   Anemia   Dehydration  Time spent: >30 minutes   Keana Dueitt  [facility] Pager [phone]. If 8PM-8AM, please contact night-coverage at [email or web address], password [facility] 02/06/2013, 4:34 PM  LOS: 3 days

## 2013-02-06 NOTE — Progress Notes (Addendum)
Inpatient Diabetes Program Recommendations  AACE/ADA: New Consensus Statement on Inpatient Glycemic Control (2013)  Target Ranges:  Prepandial:   less than 140 mg/dL      Peak postprandial:   less than 180 mg/dL (1-2 hours)      Critically ill patients:  140 - 180 mg/dL   Reason for Visit: Results for [name] [name], [name] [name] (MRN [id number]) as of 02/06/2013 14:31  Ref. Range 02/05/2013 17:16 02/05/2013 21:38 02/06/2013 07:27 02/06/2013 12:06  Glucose-Capillary Latest Range: 70-99 mg/dL 159 (H) 157 (H) 210 (H) 158 (H)   Note fasting CBG's remain elevated. Please increase Lantus to 46 units daily.       Addendum:  Attempted to speak to patient however she was grimacing and said she did not feel like talking.  She was concerned about lost pager charger?  Attempted to help her look for it but was unsuccessful.  I asked her if she was regularly taking her insulin at home and she said "yes".  When I asked about her CBG's she replied "can everyone just give me a break".  Spoke to Network engineer at [employer] and she called nursing tech to help patient.
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
## 2013-02-06 NOTE — Progress Notes (Signed)
Let patient signed consent  For surgery tomorrow. Pt. Read and signed consent. But afterwards said that it was not explained to her in detail that she will possibly had a midfoot amputation.  Told RN that she does not want to push through with the surgery tom. On call Md. Notified and Md said to call and notify Dr. Sharol Given in [place].

## 2013-02-06 NOTE — Progress Notes (Signed)
Physical Therapy Treatment Patient Details Name: [name] [name] MRN: [id number] DOB: Nov 11, 1955 Today's Date: 02/06/2013 Time: 1200-1226 PT Time Calculation (min): 26 min  PT Assessment / Plan / Recommendation Comments on Treatment Session  Pt is making good progress with mobility. Pt is able to tolerate increased distance with gait, but is still restricted due to pain in hip/foot. Recommend continued PT with plans for D/C home.    Follow Up Recommendations  No PT follow up;Home health PT     Does the patient have the potential to tolerate intense rehabilitation     Barriers to Discharge        Equipment Recommendations  Rolling walker with 5" wheels    Recommendations for Other Services    Frequency     Plan Discharge plan remains appropriate    Precautions / Restrictions Precautions Precautions: None Restrictions Weight Bearing Restrictions: No   Pertinent Vitals/Pain     Mobility  Bed Mobility Bed Mobility: Sit to Supine Supine to Sit: 5: Supervision Sit to Supine: Not Tested (comment) Details for Bed Mobility Assistance: cues for technique, pt misjudges where to sit down and needs cues on how to scoot yp in bed. Transfers Transfers: Sit to Stand;Stand to Sit Sit to Stand: 5: Supervision Stand to Sit: 5: Supervision Details for Transfer Assistance: cues for hand placement for increased safety. Ambulation/Gait Ambulation/Gait Assistance: 5: Supervision Ambulation Distance (Feet): 75 Feet Assistive device: Rolling walker Ambulation/Gait Assistance Details: decreased speed, standing rest breaks due to sharp pain,  Gait Pattern: Step-to pattern;Decreased step length - left;Decreased stance time - right;Decreased weight shift to right Gait velocity: decreased Stairs: No    Exercises     PT Diagnosis:    PT Problem List:   PT Treatment Interventions:     PT Goals Acute Rehab PT Goals PT Goal: Sit to Supine/Side - Progress: Progressing toward goal PT Goal: Sit  to Stand - Progress: Progressing toward goal PT Goal: Stand to Sit - Progress: Progressing toward goal PT Transfer Goal: Bed to Chair/Chair to Bed - Progress: Progressing toward goal PT Goal: Ambulate - Progress: Progressing toward goal  Visit Information  Last PT Received On: 02/06/13 Assistance Needed: +1    Subjective Data  Subjective: My hip and leg hurts.   Cognition  Cognition Overall Cognitive Status: Appears within functional limits for tasks assessed/performed Arousal/Alertness: Awake/alert Orientation Level: Appears intact for tasks assessed Behavior During Session: [facility] for tasks performed    Balance  Balance Balance Assessed: No Static Standing Balance Static Standing - Balance Support: Bilateral upper extremity supported Static Standing - Level of Assistance: 5: Stand by assistance  End of Session PT - End of Session Equipment Utilized During Treatment: Gait belt Activity Tolerance: Patient limited by pain Patient left: in bed;with call bell/phone within reach Nurse Communication: Mobility status   GP     Lelon Mast 02/06/2013, 12:35 PM

## 2013-02-07 ENCOUNTER — Encounter (HOSPITAL_COMMUNITY)
Admission: EM | Disposition: A | Discharge status: Home Health | Payer: Self-pay | Source: Home / Self Care | Attending: Internal Medicine

## 2013-02-07 DIAGNOSIS — I10 Essential (primary) hypertension: Secondary | ICD-10-CM

## 2013-02-07 DIAGNOSIS — IMO0002 Reserved for concepts with insufficient information to code with codable children: Secondary | ICD-10-CM

## 2013-02-07 DIAGNOSIS — L97409 Non-pressure chronic ulcer of unspecified heel and midfoot with unspecified severity: Secondary | ICD-10-CM

## 2013-02-07 DIAGNOSIS — F411 Generalized anxiety disorder: Secondary | ICD-10-CM

## 2013-02-07 LAB — SEDIMENTATION RATE: Sed Rate: 102 mm/hr — ABNORMAL HIGH (ref 0–22)

## 2013-02-07 LAB — GLUCOSE, CAPILLARY
Glucose-Capillary: 157 mg/dL — ABNORMAL HIGH (ref 70–99)
Glucose-Capillary: 180 mg/dL — ABNORMAL HIGH (ref 70–99)

## 2013-02-07 LAB — C-REACTIVE PROTEIN: CRP: 14.1 mg/dL — ABNORMAL HIGH (ref <0.60)

## 2013-02-07 SURGERY — AMPUTATION, FOOT, RAY
Anesthesia: General | Laterality: Right

## 2013-02-07 MED ORDER — BUSPIRONE HCL 5 MG PO TABS
5.0000 mg | ORAL_TABLET | Freq: Three times a day (TID) | ORAL | Status: DC
  Administered 2013-02-07 – 2013-02-20 (×37): 5 mg via ORAL
  Filled 2013-02-07 (×42): qty 1

## 2013-02-07 MED ORDER — GABAPENTIN 300 MG PO CAPS
300.0000 mg | ORAL_CAPSULE | Freq: Three times a day (TID) | ORAL | Status: DC
  Administered 2013-02-07 – 2013-02-15 (×22): 300 mg via ORAL
  Filled 2013-02-07 (×24): qty 1

## 2013-02-07 MED ORDER — VANCOMYCIN HCL IN DEXTROSE 1-5 GM/200ML-% IV SOLN
1000.0000 mg | Freq: Two times a day (BID) | INTRAVENOUS | Status: DC
  Administered 2013-02-07 – 2013-02-10 (×7): 1000 mg via INTRAVENOUS
  Filled 2013-02-07 (×9): qty 200

## 2013-02-07 MED ORDER — PREGABALIN 50 MG PO CAPS
50.0000 mg | ORAL_CAPSULE | Freq: Two times a day (BID) | ORAL | Status: DC
  Administered 2013-02-07 – 2013-02-20 (×25): 50 mg via ORAL
  Filled 2013-02-07 (×24): qty 1
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
## 2013-02-07 NOTE — Progress Notes (Signed)
PT Cancellation Note  Patient Details Name: [name] [name] MRN: [id number] DOB: February 27, 1956   Cancelled Treatment:    Reason Eval/Treat Not Completed: Pain limiting ability to participate.  Patient politely refusing PT today.  Possible surgery later today.  MD** Will need new PT consult order post-op.  Thank you!   Despina Pole 02/07/2013, 12:58 PM [phone]

## 2013-02-07 NOTE — Progress Notes (Signed)
Dr. Marcelino Scot called to see patient for second opinion.To see patient today.

## 2013-02-07 NOTE — Progress Notes (Signed)
[name], female   DOB: 02-05-1956, 57 y.o.   MRN: [id number] Surgery was canceled today due to the patient's concern regarding surgery. Both she and her husband did not want to consider surgery at this time.  A consult was obtained with Dr. Marcelino Scot who recommended a transtibial amputation.  I will discuss this with the patient again Monday morning with reevaluation of her foot. We will determine at that time if she is a candidate for foot salvage surgery or would require a transtibial amputation.  Discussed with the patient this infection is life threatening and that she does require surgery.  Patient's husband states that it will be her decision. I stated understand.

## 2013-02-07 NOTE — Progress Notes (Signed)
Patient refused to go to OR as she wanted her family to be here and wanted MD to explain to her test results.

## 2013-02-07 NOTE — Progress Notes (Signed)
Patient starting to be difficult to approach with, I just let patient air out frustrations and fear about her schedule OR.

## 2013-02-07 NOTE — Progress Notes (Addendum)
VASCULAR LAB PRELIMINARY  ARTERIAL  ABI completed: Within normal limits bilaterally.    RIGHT    LEFT    PRESSURE WAVEFORM  PRESSURE WAVEFORM  BRACHIAL 149 T BRACHIAL 147 T  DP 152 T DP 154 T  PT 187 T PT 183 T    RIGHT LEFT  ABI 1.26 1.23     [name] [name] FRANCES, RCS 02/07/2013, 10:02 AM

## 2013-02-07 NOTE — Progress Notes (Signed)
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 -Ortho has seen patient and recommended surgery; patient refusing immediate surgical intervention and ask for second opinion. -Dr. Marcelino Scot saw patient and his concerns were even higher suggesting that for better chances of healing she will need transtibial amputation. -plan is for surgery on Monday with Dr. Sharol Given. -follow ABI to help guiding degree of PVD and probability for healing. -CRP and ESR to help as markers for inflammation and infection.  -continue PRN analgesics  Uncontrolled type 2 diabetes  -Resistant sliding scale. Continue home Lantus. Hemoglobin A1c 14.0 on 01/10/2013, suggesting blood sugars have been uncontrolled at home.  -CBG's fasting continue to be elevated; will continue increasing insulin for better control as needed.  Hypokalemia and hyponatremia  Likely due to uncontrolled diabetes.  Replace potassium as needed. Suspect hyponatremia is likely due to hyperosmolar hyperglycemic nonketotic state from uncontrolled diabetes. Now improving, Continue fluid resuscitation.  -Sodium 130 and potassium repleted. -continue monitoring and repletion as needed  Dehydration  Improved with IVF's. Good urine output Will change IVF to NSL  Anemia  Likely due to chronic disease.  Anemia panel corroborates diagnosis of AOCD.  -Will monitor Hgb especially after surgery.  Hypertension  Stable. Continue low sodium diet.  Neuropathy: -continue gabapentin -start lyrica.  Hyperlipidemia  Stable. Cont statins  Anxiety: start buspar.  Prophylaxis  Lovenox.   Code Status: full Family Communication:No family at bedside Disposition Plan:  To be detrmine    Brief narrative: [name] [name] is a 57 y.o. Caucasian female with history of hypertension, uncontrolled diabetes, history of MI, and fibromyalgia who presents with the above complaints. She reports that in December of 2014 she had a fall and had noted ulcers on her right foot. She has seen an outpatient physician for her foot but the ulcers on her foot had been getting worse. She has had intermittent episodes of antibiotics without any significant improvement. She has seen her primary care physician for uncontrolled diabetes and her insulin regimen is being adjusted. She presented to the ER with complaints of right hip pain and right leg pain. She has had a fall on 01/29/2013, since then she has had increasing right hip and right leg pain. Imaging the ER was negative for any fractures or abnormality in her hip. Her right foot had 2 significant ulcers with erythema as a result she was referred to the hospitalist service for admission for further care and management. She has felt feverish at home but has not checked her temperature. Denies any nausea and vomiting. Does complain of left-sided chest pain which is worse with palpation. Denies any difficulty breathing. Denies any diarrhea. Denies any headaches or vision changes   Consultants:  Orthopedics  Procedures:  See below for x-ray results  Antibiotics:  Vancomycin  primaxin  HPI/Subjective: Still ongoing pain on her right foot and right  leg. Afebrile. Anxious and refusing invasive tx until surgeyr is explained to family members  Objective: Filed Vitals:    02/06/13 1545 02/06/13 2147 02/07/13 0611 02/07/13 1423  BP:  104/77 109/67 108/71  Pulse:  88 84 91  Temp:  98.2 F (36.8 C) 98.4 F (36.9 C) 98.8 F (37.1 C)  TempSrc:  Oral Oral Oral  Resp:  16 15 16   Height:      Weight: 73.6 kg (162 lb 4.1 oz)  73.1 kg (161 lb 2.5 oz)   SpO2:  92% 93% 97%    Intake/Output Summary (Last 24 hours) at 02/07/13 1808 Last data filed at 02/07/13 0700  Gross per 24 hour  Intake   1715 ml  Output      0 ml  Net   1715 ml    Exam: GEN: AAOX3, no fever, complaining of right hip and right foot pain Cardio: regular rate and rhythm, S1, S2 normal, no murmur, rubs or gallops Lungs: CTA bilaterally GI: soft, non-tender; bowel sounds normal; no masses, no organomegaly  Extremities: RLE with Good pulses. Trace edema. 2 ulcerated lesions in the right foot in the medial and lateral aspect, medial ulcer is about quarter-sized size and lateral ulcer is about 4-5 centimeters in size. Surrounding erythema on the dorsal aspect of the foot noted.   Data Reviewed: Basic Metabolic Panel:  Recent Labs Lab 02/03/13 1210 02/04/13 0606 02/06/13 0520  NA 125* 130* 132*  K 3.2* 3.6 4.3  CL 81* 90* 96  CO2 31 30 30   GLUCOSE 520* 290* 222*  BUN 20 15 11   CREATININE 0.90 0.86 0.81  CALCIUM 9.3 8.7 8.6  MG  --  2.2  --     CBC:  Recent Labs Lab 02/03/13 1210 02/04/13 0606 02/06/13 0520  WBC 9.6 10.9* 12.3*  NEUTROABS 7.8*  --   --   HGB 11.8* 10.7* 11.0*  HCT 33.7* 30.0* 32.7*  MCV 77.8* 75.9* 78.4  PLT 225 228 264    CBG:  Recent Labs Lab 02/06/13 1651 02/06/13 2137 02/07/13 0737 02/07/13 1213 02/07/13 1710  GLUCAP 175* 233* 157* 139* 120*    Studies: Dg Chest 2 View  02/03/2013  *RADIOLOGY REPORT*  Clinical Data: Fall with left-sided rib pain.  CHEST - 2 VIEW  Comparison: 10/15/2012  Findings: Chest x-ray shows mild bibasilar atelectasis.  No pneumothorax, focal consolidation or pleural fluid is identified. The heart size is normal.   Visualized bony structures show no obvious acute rib fractures.  There is chronic deformity of the proximal left humerus related to prior fracture.  IMPRESSION: Mild bibasilar atelectasis.   Original Report Authenticated By: Aletta Edouard, M.D.    Dg Hip Complete Right  02/03/2013  *RADIOLOGY REPORT*  Clinical Data: Fall with right hip pain.  RIGHT HIP - COMPLETE 2+ VIEW  Comparison:  None.  Findings:  There is no evidence of hip fracture or dislocation. There is no evidence of arthropathy or other focal bone abnormality. The bony pelvis is intact.  IMPRESSION: Negative.   Original Report Authenticated By: Aletta Edouard, M.D.    Ct Abdomen Pelvis W Contrast  02/03/2013  *RADIOLOGY REPORT*  Clinical Data: Fall 5 days ago.  Right lower quadrant pain. Nausea.  History of "liver cancer."  Diabetes.  Hypertension.  CT ABDOMEN AND PELVIS WITH CONTRAST  Technique:  Multidetector CT imaging of the abdomen and pelvis was performed following the standard protocol during bolus administration of intravenous contrast.  Contrast: 1 OMNIPAQUE IOHEXOL 300 MG/ML  SOLN, 155m OMNIPAQUE IOHEXOL 300 MG/ML  SOLN  Comparison: 07/06/2010  Findings: Lung bases:  Clear lung bases.  Mild cardiomegaly.  A tiny hiatal hernia. Prominent right cardiophrenic angle node on image 10/series 2 is chronic and reactive.  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  Prominent left portal vein on image 18/series 2, similar to 06/04/2007.  Persistent splenomegaly, with the spleen measuring 13.7 cm cranial caudal.  Normal stomach, pancreas. Cholecystectomy without biliary ductal dilatation.  Normal adrenal glands.  Bilateral heterogeneous renal enhancement, especially on the delayed series seven.  Mildly prominent retroperitoneal nodes which are likely reactive and are unchanged.  Colonic stool burden suggests constipation.  Normal terminal ileum and appendix.  No pneumatosis or free  intraperitoneal air.  Normal small bowel without abdominal ascites.  1.3 cm right external iliac node on image 84/series 2 is new since 07/06/2010.  There are also prominent right inguinal nodes which are newly enlarged.  Normal urinary bladder.  Hysterectomy. No significant free fluid.  Bones/Musculoskeletal:  No acute osseous abnormality.  IMPRESSION:  1.  No acute post-traumatic deformity identified. Heterogeneous renal enhancement is suspicious for bilateral pyelonephritis. 2.  Moderate cirrhosis with persistent splenomegaly. 3. Possible constipation. 4.  Normal appendix. 5.  Mild adenopathy in the right external iliac station with prominent right inguinal nodes.  Favored to be reactive.  Not in the typical drainage pattern for liver cancer metastasis.  Consider physical exam correlation.   Original Report Authenticated By: KAbigail Miyamoto M.D.    Dg Foot Complete Right  02/03/2013  *RADIOLOGY REPORT*  Clinical Data: Diabetic foot.  Redness and swelling.  Lateral sore on bottom of foot at  head of first metatarsal.  RIGHT FOOT COMPLETE - 3+ VIEW  Comparison: None.  Findings: Minimal hallux valgus deformity.  There may be soft tissue swelling about the medial aspect of the first metatarsal phalangeal joint.  No osseous destruction.  Achilles and calcaneal spurs.  Probable dorsal forefoot soft tissue swelling as well.  No soft tissue gas or radiopaque foreign object.  Possible skin/soft tissue ulcer about the fifth metatarsal phalangeal joint, most apparent on the oblique view.  IMPRESSION: No acute osseous abnormality.  Possible soft tissue defect about the lateral aspect of the fifth MTP joint.  Relatively diffuse soft tissue swelling about the forefoot.   Original Report Authenticated By: KAbigail Miyamoto M.D.     Scheduled Meds: . atorvastatin  10 mg Oral q1800  . busPIRone  5 mg Oral TID  . calcium-vitamin D  1 tablet Oral Daily  . cholecalciferol  2,000 Units Oral Daily  . clindamycin (CLEOCIN) IV  900 mg  Intravenous On Call to OR  . DULoxetine  60 mg Oral Daily  . enoxaparin (LOVENOX) injection  40 mg Subcutaneous Q24H  . escitalopram  10 mg Oral Daily  . gabapentin  300 mg Oral TID  . imipenem-cilastatin  500 mg Intravenous Q8H  . insulin aspart  0-20 Units Subcutaneous TID WC  . insulin aspart  0-5 Units Subcutaneous QHS  . insulin aspart  10 Units Subcutaneous TID WC  . insulin glargine  45 Units Subcutaneous QHS  . metaxalone  800 mg Oral TID  . multivitamin with minerals  1 tablet Oral Daily  . polyethylene glycol  17 g Oral Daily  . pregabalin  50 mg Oral BID  . vancomycin  1,000 mg Intravenous Q12H  . vitamin C  500 mg Oral Daily   Continuous Infusions: .  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 If 8PM-8AM, please contact night-coverage at [email or web address], password [facility] 02/07/2013, 6:08 PM  LOS: 4 days

## 2013-02-07 NOTE — Progress Notes (Signed)
Patient asking for second opinion before surgery today.Also wants to know why she is continuing to have right hip pain.Call placed to Dr.Duda.Dr.Duda aware  No new orders at present time .

## 2013-02-07 NOTE — Progress Notes (Signed)
ANTIBIOTIC CONSULT NOTE - FOLLOW UP  Pharmacy Consult for vancomycin Indication: cellulitis  Labs:  Recent Labs  02/04/13 0606 02/06/13 0520  WBC 10.9* 12.3*  HGB 10.7* 11.0*  PLT 228 264  CREATININE 0.86 0.81   Estimated Creatinine Clearance: 68.3 ml/min (by C-G formula based on Cr of 0.81).  Recent Labs  02/07/13 0211  VANCOTROUGH 10.4     Microbiology: No results found for this or any previous visit (from the past 720 hour(s)).  Anti-infectives   Start     Dose/Rate Route Frequency Ordered Stop   02/07/13 0600  clindamycin (CLEOCIN) IVPB 900 mg     900 mg 100 mL/hr over 30 Minutes Intravenous On call to O.R. 02/06/13 1920 02/08/13 0559   02/07/13 0400  vancomycin (VANCOCIN) IVPB 1000 mg/200 mL premix     1,000 mg 200 mL/hr over 60 Minutes Intravenous Every 12 hours 02/07/13 0333     02/04/13 1300  imipenem-cilastatin (PRIMAXIN) 500 mg in sodium chloride 0.9 % 100 mL IVPB     500 mg 200 mL/hr over 30 Minutes Intravenous 3 times per day 02/04/13 1214     02/04/13 0300  vancomycin (VANCOCIN) 750 mg in sodium chloride 0.9 % 150 mL IVPB  Status:  Discontinued     750 mg 150 mL/hr over 60 Minutes Intravenous Every 12 hours 02/03/13 2215 02/07/13 0333   02/03/13 2100  vancomycin (VANCOCIN) IVPB 1000 mg/200 mL premix  Status:  Discontinued     1,000 mg 200 mL/hr over 60 Minutes Intravenous Every 12 hours 02/03/13 2033 02/03/13 2208   02/03/13 1430  vancomycin (VANCOCIN) IVPB 1000 mg/200 mL premix     1,000 mg 200 mL/hr over 60 Minutes Intravenous  Once 02/03/13 1420 02/03/13 1612      Assessment: 57yo female has vancomycin trough within goal range of 10-15 though at low end of goal and lab was drawn early while last dose was hung late; plan for surgery soon for possible amputation given necrotic tissue.  Goal of Therapy:  Vancomycin trough level 10-15 mcg/ml  Plan:  Will change vancomycin to 1064m IV Q12H for calculated trough closer to 14 and continue to  monitor.  VWynona Neat PharmD, BCPS  02/07/2013,3:34 AM

## 2013-02-08 LAB — GLUCOSE, CAPILLARY

## 2013-02-08 NOTE — Progress Notes (Addendum)
Physical Therapy Treatment Patient Details Name: [name] [name] MRN: [id number] DOB: July 09, 1956 Today's Date: 02/08/2013 Time: 3559-7416 PT Time Calculation (min): 19 min  PT Assessment / Plan / Recommendation Comments on Treatment Session  Patient limited by pain today.  To have surgery Monday per MD note.  MD* Will need reorder for PT consult post-op.  Thank you.    Follow Up Recommendations  Home health PT;Supervision/Assistance - 24 hour     Does the patient have the potential to tolerate intense rehabilitation     Barriers to Discharge        Equipment Recommendations  Rolling walker with 5" wheels    Recommendations for Other Services    Frequency Min 3X/week   Plan Discharge plan remains appropriate    Precautions / Restrictions Precautions Precautions: None Restrictions Weight Bearing Restrictions: No   Pertinent Vitals/Pain Pain 8/10 limiting mobility today.    Mobility  Bed Mobility Bed Mobility: Supine to Sit;Sitting - Scoot to Edge of Bed Supine to Sit: 5: Supervision;HOB flat Sitting - Scoot to Edge of Bed: 5: Supervision Details for Bed Mobility Assistance: No cues or assist needed Transfers Transfers: Sit to Stand;Stand to Sit Sit to Stand: 5: Supervision;With upper extremity assist;From bed Stand to Sit: 5: Supervision;With upper extremity assist;With armrests;To chair/3-in-1 Details for Transfer Assistance: Verbal cues for hand placement and safety. Ambulation/Gait Ambulation/Gait Assistance: 5: Supervision Ambulation Distance (Feet): 30 Feet Assistive device: Rolling walker Ambulation/Gait Assistance Details: Patient agreed to ambulate in room.  Patient with antalgic gait with flexed posture. Slow gait. Gait Pattern: Step-to pattern;Decreased step length - left;Decreased stance time - right;Decreased weight shift to right Gait velocity: decreased    Exercises     PT Diagnosis:    PT Problem List:   PT Treatment Interventions:     PT  Goals Acute Rehab PT Goals PT Goal: Supine/Side to Sit - Progress: Progressing toward goal PT Goal: Sit to Stand - Progress: Progressing toward goal PT Goal: Stand to Sit - Progress: Progressing toward goal PT Transfer Goal: Bed to Chair/Chair to Bed - Progress: Progressing toward goal PT Goal: Ambulate - Progress: Progressing toward goal  Visit Information  Last PT Received On: 02/08/13 Assistance Needed: +1    Subjective Data  Subjective: Patient expressing concerns about upcoming surgery.   Cognition  Cognition Overall Cognitive Status: Appears within functional limits for tasks assessed/performed Arousal/Alertness: Awake/alert Orientation Level: Appears intact for tasks assessed Behavior During Session: [facility] for tasks performed    Balance     End of Session PT - End of Session Equipment Utilized During Treatment: Gait belt Activity Tolerance: Patient limited by pain Patient left: in chair;with call bell/phone within reach Nurse Communication: Mobility status (request for bath and linen change)   GP     Despina Pole 02/08/2013, 10:47 AM Carita Pian. Sanjuana Kava, [place] Pager [phone]

## 2013-02-08 NOTE — Progress Notes (Addendum)
[facility] PROGRESS NOTE  ALABAMA DOIG DUK:[id number] DOB: 1956-07-23 DOA: 02/03/2013 PCP: Garnet Koyanagi, DO  Assessment/Plan: Active Problems:   Type II or unspecified type diabetes mellitus with unspecified complication, uncontrolled   HYPERLIPIDEMIA   ANXIETY   HYPERTENSION   Cirrhosis of liver without mention of alcohol   Cellulitis and abscess of foot   Diabetic foot ulcer associated with type 2 diabetes mellitus   Hyponatremia   Hypokalemia   Anemia   Dehydration    Diabetic foot ulcer on right lower extremity with cellulitis of the foot  -continue vancomycin and primaxin -MRI demonstrating abscess and tenosynovitis; concerns that antibiotics alone might not be enough at this point. -Ortho has seen patient and recommended surgery; patient refusing immediate surgical intervention and ask for second opinion. Dr. Marcelino Scot saw patient and his concerns were even higher suggesting that for better chances of healing she will need transtibial amputation. -Plan is for surgery on Monday with Dr. Sharol Given. -ABI results are normal (probably will be ok for debridement and minimal amputation) -CRP and ESR elevated (14 and 102 respectively)  -continue PRN analgesics -CBC in am  Uncontrolled type 2 diabetes  -Resistant sliding scale. Continue home Lantus. Hemoglobin A1c 14.0 on 01/10/2013, suggesting blood sugars have been uncontrolled at home.  -CBG's now within 120-160 range. -continue SSI and lantus  Hypokalemia and hyponatremia  Likely due to uncontrolled diabetes.  Replace potassium as needed. Suspect hyponatremia is likely due to hyperosmolar hyperglycemic nonketotic state from uncontrolled diabetes. Now improving, Continue fluid resuscitation.  -Sodium 130 and potassium repleted. -continue monitoring and replete as needed  Dehydration  Improved after IVF's. Good urine output Will change IVF to NSL  Anemia  Likely due to chronic disease. Anemia panel corroborates diagnosis  of AOCD.  -Will monitor Hgb especially after surgery.  Hypertension  Stable. Continue low sodium diet.  Neuropathy: -continue gabapentin -continue lyrica.  Hyperlipidemia  Stable. Cont statins  Anxiety: better today. Will continue buspar.  Prophylaxis  Lovenox.   Code Status: full Family Communication:No family at bedside Disposition Plan:  To be detrmine    Consultants:  Orthopedics  Procedures:  See below for x-ray results  Antibiotics:  Vancomycin  primaxin  HPI/Subjective: Reports pain on her foot is still present; endorses improvement on right hip pain. Less anxious.  Objective: Filed Vitals:   02/07/13 0611 02/07/13 1423 02/07/13 2207 02/08/13 0640  BP: 109/67 108/71 97/52 107/60  Pulse: 84 91 81 78  Temp: 98.4 F (36.9 C) 98.8 F (37.1 C) 97.8 F (36.6 C) 98.2 F (36.8 C)  TempSrc: Oral Oral Oral Oral  Resp: 15 16 16 16   Height:      Weight: 73.1 kg (161 lb 2.5 oz)     SpO2: 93% 97% 93% 95%   No intake or output data in the 24 hours ending 02/08/13 1502  Exam: GEN: AAOX3, no fever, complaining of right hip and right foot pain Cardio: regular rate and rhythm, S1, S2 normal, no murmur, rubs or gallops Lungs: CTA bilaterally GI: soft, non-tender; bowel sounds normal; no masses, no organomegaly  Extremities: RLE with Good pulses. Trace edema. 2 ulcerated lesions in the right foot in the medial and lateral aspect, medial ulcer is about quarter-sized size and lateral ulcer is about 4-5 centimeters in size. Surrounding erythema on the dorsal aspect of the foot noted.   Data Reviewed: Basic Metabolic Panel:  Recent Labs Lab 02/03/13 1210 02/04/13 0606 02/06/13 0520  NA 125* 130* 132*  K 3.2* 3.6  4.3  CL 81* 90* 96  CO2 31 30 30   GLUCOSE 520* 290* 222*  BUN 20 15 11   CREATININE 0.90 0.86 0.81  CALCIUM 9.3 8.7 8.6  MG  --  2.2  --     CBC:  Recent Labs Lab 02/03/13 1210 02/04/13 0606 02/06/13 0520  WBC 9.6 10.9* 12.3*   NEUTROABS 7.8*  --   --   HGB 11.8* 10.7* 11.0*  HCT 33.7* 30.0* 32.7*  MCV 77.8* 75.9* 78.4  PLT 225 228 264    CBG:  Recent Labs Lab 02/07/13 1710 02/07/13 2041 02/07/13 2211 02/08/13 0758 02/08/13 1248  GLUCAP 120* 157* 180* 143* 165*    Studies: Dg Chest 2 View  02/03/2013  *RADIOLOGY REPORT*  Clinical Data: Fall with left-sided rib pain.  CHEST - 2 VIEW  Comparison: 10/15/2012  Findings: Chest x-ray shows mild bibasilar atelectasis.  No pneumothorax, focal consolidation or pleural fluid is identified. The heart size is normal.  Visualized bony structures show no obvious acute rib fractures.  There is chronic deformity of the proximal left humerus related to prior fracture.  IMPRESSION: Mild bibasilar atelectasis.   Original Report Authenticated By: Aletta Edouard, M.D.    Dg Hip Complete Right  02/03/2013  *RADIOLOGY REPORT*  Clinical Data: Fall with right hip pain.  RIGHT HIP - COMPLETE 2+ VIEW  Comparison:  None.  Findings:  There is no evidence of hip fracture or dislocation. There is no evidence of arthropathy or other focal bone abnormality. The bony pelvis is intact.  IMPRESSION: Negative.   Original Report Authenticated By: Aletta Edouard, M.D.    Ct Abdomen Pelvis W Contrast  02/03/2013  *RADIOLOGY REPORT*  Clinical Data: Fall 5 days ago.  Right lower quadrant pain. Nausea.  History of "liver cancer."  Diabetes.  Hypertension.  CT ABDOMEN AND PELVIS WITH CONTRAST  Technique:  Multidetector CT imaging of the abdomen and pelvis was performed following the standard protocol during bolus administration of intravenous contrast.  Contrast: 1 OMNIPAQUE IOHEXOL 300 MG/ML  SOLN, 132m OMNIPAQUE IOHEXOL 300 MG/ML  SOLN  Comparison: 07/06/2010  Findings: Lung bases:  Clear lung bases.  Mild cardiomegaly.  A tiny hiatal hernia. Prominent right cardiophrenic angle node on image 10/series 2 is chronic and reactive.  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  Prominent left portal vein on image 18/series 2, similar to 06/04/2007.  Persistent splenomegaly, with the spleen measuring 13.7 cm cranial caudal.  Normal stomach, pancreas. Cholecystectomy without biliary ductal dilatation.  Normal adrenal glands.  Bilateral heterogeneous renal enhancement, especially on the delayed series seven.  Mildly prominent retroperitoneal nodes which are likely reactive and are unchanged.  Colonic stool burden suggests constipation.  Normal terminal ileum and appendix.  No pneumatosis or free intraperitoneal air.  Normal small bowel without abdominal ascites.  1.3 cm right external iliac node on image 84/series 2 is new since 07/06/2010.  There are also prominent right inguinal nodes which are newly enlarged.  Normal urinary bladder.  Hysterectomy. No significant free fluid.  Bones/Musculoskeletal:  No acute osseous abnormality.  IMPRESSION:  1.  No acute post-traumatic deformity identified. Heterogeneous renal enhancement is suspicious for bilateral pyelonephritis. 2.  Moderate cirrhosis with persistent splenomegaly. 3. Possible constipation. 4.  Normal appendix. 5.  Mild adenopathy in the right external iliac station with prominent right inguinal nodes.  Favored to be reactive.  Not in the typical drainage pattern for liver cancer metastasis.  Consider physical exam  correlation.   Original Report Authenticated By: Abigail Miyamoto, M.D.    Dg Foot Complete Right  02/03/2013  *RADIOLOGY REPORT*  Clinical Data: Diabetic foot.  Redness and swelling.  Lateral sore on bottom of foot at  head of first metatarsal.  RIGHT FOOT COMPLETE - 3+ VIEW  Comparison: None.  Findings: Minimal hallux valgus deformity.  There may be soft tissue swelling about the medial aspect of the first metatarsal phalangeal joint.  No osseous destruction.  Achilles and calcaneal spurs.  Probable dorsal forefoot soft tissue swelling as well.  No soft tissue gas or radiopaque foreign  object.  Possible skin/soft tissue ulcer about the fifth metatarsal phalangeal joint, most apparent on the oblique view.  IMPRESSION: No acute osseous abnormality.  Possible soft tissue defect about the lateral aspect of the fifth MTP joint.  Relatively diffuse soft tissue swelling about the forefoot.   Original Report Authenticated By: Abigail Miyamoto, M.D.     Scheduled Meds: . atorvastatin  10 mg Oral q1800  . busPIRone  5 mg Oral TID  . calcium-vitamin D  1 tablet Oral Daily  . cholecalciferol  2,000 Units Oral Daily  . DULoxetine  60 mg Oral Daily  . enoxaparin (LOVENOX) injection  40 mg Subcutaneous Q24H  . escitalopram  10 mg Oral Daily  . gabapentin  300 mg Oral TID  . imipenem-cilastatin  500 mg Intravenous Q8H  . insulin aspart  0-20 Units Subcutaneous TID WC  . insulin aspart  0-5 Units Subcutaneous QHS  . insulin aspart  10 Units Subcutaneous TID WC  . insulin glargine  45 Units Subcutaneous QHS  . metaxalone  800 mg Oral TID  . multivitamin with minerals  1 tablet Oral Daily  . polyethylene glycol  17 g Oral Daily  . pregabalin  50 mg Oral BID  . vancomycin  1,000 mg Intravenous Q12H  . vitamin C  500 mg Oral Daily   Continuous Infusions:    Active Problems:   Type II or unspecified type diabetes mellitus with unspecified complication, uncontrolled   HYPERLIPIDEMIA   ANXIETY   HYPERTENSION   Cirrhosis of liver without mention of alcohol   Cellulitis and abscess of foot   Diabetic foot ulcer associated with type 2 diabetes mellitus   Hyponatremia   Hypokalemia   Anemia   Dehydration  Time spent: >30 minutes   Jasmond River  [facility] Pager [phone]. If 8PM-8AM, please contact night-coverage at [email or web address], password [facility] 02/08/2013, 3:02 PM  LOS: 5 days

## 2013-02-09 LAB — CBC
MCH: 26.4 pg (ref 26.0–34.0)
MCV: 79.1 fL (ref 78.0–100.0)
Platelets: 263 10*3/uL (ref 150–400)
RBC: 4.17 MIL/uL (ref 3.87–5.11)

## 2013-02-09 LAB — GLUCOSE, CAPILLARY

## 2013-02-09 NOTE — Progress Notes (Signed)
[facility] PROGRESS NOTE  [name] [name] LTJ:[id number] DOB: Aug 01, 1956 DOA: 02/03/2013 PCP: Garnet Koyanagi, DO  Assessment/Plan: Active Problems:   Type II or unspecified type diabetes mellitus with unspecified complication, uncontrolled   HYPERLIPIDEMIA   ANXIETY   HYPERTENSION   Cirrhosis of liver without mention of alcohol   Cellulitis and abscess of foot   Diabetic foot ulcer associated with type 2 diabetes mellitus   Hyponatremia   Hypokalemia   Anemia   Dehydration    Diabetic foot ulcer on right lower extremity with cellulitis of the foot  -continue vancomycin and primaxin -MRI demonstrating abscess and tenosynovitis; concerns that antibiotics alone might not be enough at this point and she needs surgical debridement. -Ortho has seen patient and recommended surgery; patient refusing immediate surgical intervention and ask for second opinion. Dr. Marcelino Scot saw patient and his concerns were even higher suggesting that for better chances of healing she will need transtibial amputation. -Plan is for surgery on Monday with Dr. Sharol Given. -ABI results are normal (probably will be ok for debridement and minimal amputation) -CRP and ESR elevated (14 and 102 respectively); will recheck ESR in am -continue PRN analgesics -WBC's WNL now and patient afebrile.  Uncontrolled type 2 diabetes  -Resistant sliding scale. Continue home Lantus. Hemoglobin A1c 12.1 (down from 14) sugesting blood sugars have been uncontrolled at home; but slightly better.  -CBG's now within 120-160 range. -continue SSI and lantus  Hypokalemia and hyponatremia  Likely due to uncontrolled diabetes.  Replace potassium as needed. Suspect hyponatremia is likely due to hyperosmolar hyperglycemic nonketotic state from uncontrolled diabetes. Now improving, Continue fluid resuscitation.  -Sodium 130 and potassium repleted. -continue monitoring and replete as needed  Dehydration  Improved after IVF's. Good urine  output Will change IVF to NSL  Anemia  Likely due to chronic disease. Anemia panel corroborates diagnosis of AOCD.  -Will monitor Hgb especially after surgery.  Hypertension  Stable. Continue low sodium diet.  Neuropathy: -continue gabapentin -continue lyrica.  Hyperlipidemia  Stable. Cont statins  Anxiety: better today. Will continue buspar.  Prophylaxis  Lovenox.   Code Status: full Family Communication:No family at bedside Disposition Plan:  To be detrmine    Consultants:  Orthopedics  Procedures:  See below for x-ray results  Antibiotics:  Vancomycin  primaxin  HPI/Subjective: Reports pain on her foot is still present but better. No complaints of leg pain. Less anxious.  Objective: Filed Vitals:   02/08/13 0640 02/08/13 2134 02/09/13 0553 02/09/13 1457  BP: 107/60 124/66 135/69 124/68  Pulse: 78 89 77 80  Temp: 98.2 F (36.8 C) 98.9 F (37.2 C) 97.9 F (36.6 C) 98 F (36.7 C)  TempSrc: Oral Oral Oral Oral  Resp: 16 16 16 16   Height:      Weight:   73.8 kg (162 lb 11.2 oz)   SpO2: 95% 94%  95%    Intake/Output Summary (Last 24 hours) at 02/09/13 1459 Last data filed at 02/08/13 2135  Gross per 24 hour  Intake      0 ml  Output      0 ml  Net      0 ml    Exam: GEN: AAOX3, no fever, complaining of right hip and right foot pain Cardio: regular rate and rhythm, S1, S2 normal, no murmur, rubs or gallops Lungs: CTA bilaterally GI: soft, non-tender; bowel sounds normal; no masses, no organomegaly  Extremities: RLE with Good pulses. Trace edema. 2 ulcerated lesions in the right foot in the medial  and lateral aspect, medial ulcer is about quarter-sized size and lateral ulcer is about 4-5 centimeters in size. Surrounding erythema on the dorsal aspect of the foot noted.   Data Reviewed: Basic Metabolic Panel:  Recent Labs Lab 02/03/13 1210 02/04/13 0606 02/06/13 0520  NA 125* 130* 132*  K 3.2* 3.6 4.3  CL 81* 90* 96  CO2 31 30 30    GLUCOSE 520* 290* 222*  BUN 20 15 11   CREATININE 0.90 0.86 0.81  CALCIUM 9.3 8.7 8.6  MG  --  2.2  --     CBC:  Recent Labs Lab 02/03/13 1210 02/04/13 0606 02/06/13 0520 02/09/13 0856  WBC 9.6 10.9* 12.3* 8.8  NEUTROABS 7.8*  --   --   --   HGB 11.8* 10.7* 11.0* 11.0*  HCT 33.7* 30.0* 32.7* 33.0*  MCV 77.8* 75.9* 78.4 79.1  PLT 225 228 264 263    CBG:  Recent Labs Lab 02/08/13 1248 02/08/13 1747 02/08/13 2138 02/09/13 0748 02/09/13 1216  GLUCAP 165* 151* 179* 169* 163*    Studies: Dg Chest 2 View  02/03/2013  *RADIOLOGY REPORT*  Clinical Data: Fall with left-sided rib pain.  CHEST - 2 VIEW  Comparison: 10/15/2012  Findings: Chest x-ray shows mild bibasilar atelectasis.  No pneumothorax, focal consolidation or pleural fluid is identified. The heart size is normal.  Visualized bony structures show no obvious acute rib fractures.  There is chronic deformity of the proximal left humerus related to prior fracture.  IMPRESSION: Mild bibasilar atelectasis.   Original Report Authenticated By: Aletta Edouard, M.D.    Dg Hip Complete Right  02/03/2013  *RADIOLOGY REPORT*  Clinical Data: Fall with right hip pain.  RIGHT HIP - COMPLETE 2+ VIEW  Comparison:  None.  Findings:  There is no evidence of hip fracture or dislocation. There is no evidence of arthropathy or other focal bone abnormality. The bony pelvis is intact.  IMPRESSION: Negative.   Original Report Authenticated By: Aletta Edouard, M.D.    Ct Abdomen Pelvis W Contrast  02/03/2013  *RADIOLOGY REPORT*  Clinical Data: Fall 5 days ago.  Right lower quadrant pain. Nausea.  History of "liver cancer."  Diabetes.  Hypertension.  CT ABDOMEN AND PELVIS WITH CONTRAST  Technique:  Multidetector CT imaging of the abdomen and pelvis was performed following the standard protocol during bolus administration of intravenous contrast.  Contrast: 1 OMNIPAQUE IOHEXOL 300 MG/ML  SOLN, 117m OMNIPAQUE IOHEXOL 300 MG/ML  SOLN  Comparison:  07/06/2010  Findings: Lung bases:  Clear lung bases.  Mild cardiomegaly.  A tiny hiatal hernia. Prominent right cardiophrenic angle node on image 10/series 2 is chronic and reactive.  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  Prominent left portal vein on image 18/series 2, similar to 06/04/2007.  Persistent splenomegaly, with the spleen measuring 13.7 cm cranial caudal.  Normal stomach, pancreas. Cholecystectomy without biliary ductal dilatation.  Normal adrenal glands.  Bilateral heterogeneous renal enhancement, especially on the delayed series seven.  Mildly prominent retroperitoneal nodes which are likely reactive and are unchanged.  Colonic stool burden suggests constipation.  Normal terminal ileum and appendix.  No pneumatosis or free intraperitoneal air.  Normal small bowel without abdominal ascites.  1.3 cm right external iliac node on image 84/series 2 is new since 07/06/2010.  There are also prominent right inguinal nodes which are newly enlarged.  Normal urinary bladder.  Hysterectomy. No significant free fluid.  Bones/Musculoskeletal:  No acute osseous abnormality.  IMPRESSION:  1.  No acute post-traumatic deformity identified. Heterogeneous renal enhancement is suspicious for bilateral pyelonephritis. 2.  Moderate cirrhosis with persistent splenomegaly. 3. Possible constipation. 4.  Normal appendix. 5.  Mild adenopathy in the right external iliac station with prominent right inguinal nodes.  Favored to be reactive.  Not in the typical drainage pattern for liver cancer metastasis.  Consider physical exam correlation.   Original Report Authenticated By: Abigail Miyamoto, M.D.    Dg Foot Complete Right  02/03/2013  *RADIOLOGY REPORT*  Clinical Data: Diabetic foot.  Redness and swelling.  Lateral sore on bottom of foot at  head of first metatarsal.  RIGHT FOOT COMPLETE - 3+ VIEW  Comparison: None.  Findings: Minimal hallux valgus deformity.   There may be soft tissue swelling about the medial aspect of the first metatarsal phalangeal joint.  No osseous destruction.  Achilles and calcaneal spurs.  Probable dorsal forefoot soft tissue swelling as well.  No soft tissue gas or radiopaque foreign object.  Possible skin/soft tissue ulcer about the fifth metatarsal phalangeal joint, most apparent on the oblique view.  IMPRESSION: No acute osseous abnormality.  Possible soft tissue defect about the lateral aspect of the fifth MTP joint.  Relatively diffuse soft tissue swelling about the forefoot.   Original Report Authenticated By: Abigail Miyamoto, M.D.     Scheduled Meds: . atorvastatin  10 mg Oral q1800  . busPIRone  5 mg Oral TID  . calcium-vitamin D  1 tablet Oral Daily  . cholecalciferol  2,000 Units Oral Daily  . DULoxetine  60 mg Oral Daily  . enoxaparin (LOVENOX) injection  40 mg Subcutaneous Q24H  . escitalopram  10 mg Oral Daily  . gabapentin  300 mg Oral TID  . imipenem-cilastatin  500 mg Intravenous Q8H  . insulin aspart  0-20 Units Subcutaneous TID WC  . insulin aspart  0-5 Units Subcutaneous QHS  . insulin aspart  10 Units Subcutaneous TID WC  . insulin glargine  45 Units Subcutaneous QHS  . metaxalone  800 mg Oral TID  . multivitamin with minerals  1 tablet Oral Daily  . polyethylene glycol  17 g Oral Daily  . pregabalin  50 mg Oral BID  . vancomycin  1,000 mg Intravenous Q12H  . vitamin C  500 mg Oral Daily   Continuous Infusions:    Active Problems:   Type II or unspecified type diabetes mellitus with unspecified complication, uncontrolled   HYPERLIPIDEMIA   ANXIETY   HYPERTENSION   Cirrhosis of liver without mention of alcohol   Cellulitis and abscess of foot   Diabetic foot ulcer associated with type 2 diabetes mellitus   Hyponatremia   Hypokalemia   Anemia   Dehydration  Time spent: < 30 minutes   Jakeisha Stricker  [facility] Pager [phone]. If 8PM-8AM, please contact night-coverage at  [email or web address], password [facility] 02/09/2013, 2:59 PM  LOS: 6 days

## 2013-02-10 ENCOUNTER — Encounter (HOSPITAL_COMMUNITY)
Admission: EM | Disposition: A | Discharge status: Home Health | Payer: Self-pay | Source: Home / Self Care | Attending: Internal Medicine

## 2013-02-10 ENCOUNTER — Encounter (HOSPITAL_COMMUNITY): Payer: Self-pay | Admitting: Anesthesiology

## 2013-02-10 ENCOUNTER — Inpatient Hospital Stay (HOSPITAL_COMMUNITY): Payer: BC Managed Care – PPO | Admitting: Anesthesiology

## 2013-02-10 DIAGNOSIS — E785 Hyperlipidemia, unspecified: Secondary | ICD-10-CM

## 2013-02-10 HISTORY — PX: AMPUTATION: SHX166

## 2013-02-10 LAB — CBC
HCT: 32.2 % — ABNORMAL LOW (ref 36.0–46.0)
MCH: 26.6 pg (ref 26.0–34.0)
MCV: 78.5 fL (ref 78.0–100.0)
RDW: 14 % (ref 11.5–15.5)
WBC: 8.9 10*3/uL (ref 4.0–10.5)

## 2013-02-10 LAB — BASIC METABOLIC PANEL
BUN: 11 mg/dL (ref 6–23)
CO2: 31 mEq/L (ref 19–32)
Chloride: 103 mEq/L (ref 96–112)
Creatinine, Ser: 0.77 mg/dL (ref 0.50–1.10)
GFR calc Af Amer: >90 mL/min (ref >90)

## 2013-02-10 LAB — GLUCOSE, CAPILLARY
Glucose-Capillary: 82 mg/dL (ref 70–99)
Glucose-Capillary: 89 mg/dL (ref 70–99)

## 2013-02-10 LAB — SURGICAL PCR SCREEN
MRSA, PCR: NEGATIVE
Staphylococcus aureus: POSITIVE — AB

## 2013-02-10 SURGERY — AMPUTATION, FOOT, PARTIAL
Anesthesia: General | Site: Foot | Laterality: Right | Wound class: Dirty or Infected

## 2013-02-10 MED ORDER — OXYCODONE HCL 5 MG PO TABS: 5.0000 mg | ORAL_TABLET | Freq: Once | ORAL | Status: AC | PRN

## 2013-02-10 MED ORDER — ONDANSETRON HCL 4 MG/2ML IJ SOLN: 4.0000 mg | Freq: Four times a day (QID) | INTRAMUSCULAR | Status: DC | PRN

## 2013-02-10 MED ORDER — PHENYLEPHRINE HCL 10 MG/ML IJ SOLN
INTRAMUSCULAR | Status: DC | PRN
  Administered 2013-02-10 (×5): 40 ug via INTRAVENOUS

## 2013-02-10 MED ORDER — LACTATED RINGERS IV SOLN
INTRAVENOUS | Status: DC | PRN
  Administered 2013-02-10 (×2): via INTRAVENOUS

## 2013-02-10 MED ORDER — HYDROMORPHONE HCL PF 1 MG/ML IJ SOLN
0.5000 mg | INTRAMUSCULAR | Status: DC | PRN
  Filled 2013-02-10: qty 1

## 2013-02-10 MED ORDER — LACTATED RINGERS IV SOLN
INTRAVENOUS | Status: DC
  Administered 2013-02-10: 17:00:00 via INTRAVENOUS

## 2013-02-10 MED ORDER — HYDROMORPHONE HCL PF 1 MG/ML IJ SOLN
0.2500 mg | INTRAMUSCULAR | Status: DC | PRN
  Administered 2013-02-10 (×4): 0.5 mg via INTRAVENOUS

## 2013-02-10 MED ORDER — LIDOCAINE HCL (CARDIAC) 20 MG/ML IV SOLN
INTRAVENOUS | Status: DC | PRN
  Administered 2013-02-10: 100 mg via INTRAVENOUS

## 2013-02-10 MED ORDER — ONDANSETRON HCL 4 MG PO TABS: 4.0000 mg | ORAL_TABLET | Freq: Four times a day (QID) | ORAL | Status: DC | PRN

## 2013-02-10 MED ORDER — METOCLOPRAMIDE HCL 5 MG PO TABS: 5.0000 mg | ORAL_TABLET | Freq: Three times a day (TID) | ORAL | Status: DC | PRN

## 2013-02-10 MED ORDER — VANCOMYCIN HCL IN DEXTROSE 1-5 GM/200ML-% IV SOLN
1000.0000 mg | Freq: Two times a day (BID) | INTRAVENOUS | Status: AC
  Administered 2013-02-10 – 2013-02-11 (×3): 1000 mg via INTRAVENOUS
  Filled 2013-02-10 (×3): qty 200

## 2013-02-10 MED ORDER — GENTAMICIN SULFATE 40 MG/ML IJ SOLN
INTRAMUSCULAR | Status: DC | PRN
  Administered 2013-02-10: 160 mg

## 2013-02-10 MED ORDER — HYDROMORPHONE HCL PF 1 MG/ML IJ SOLN
INTRAMUSCULAR | Status: AC
  Administered 2013-02-10: 0.5 mg via INTRAVENOUS
  Filled 2013-02-10: qty 1

## 2013-02-10 MED ORDER — OXYCODONE HCL 5 MG/5ML PO SOLN: 5.0000 mg | Freq: Once | ORAL | Status: AC | PRN

## 2013-02-10 MED ORDER — PROMETHAZINE HCL 25 MG/ML IJ SOLN: 6.2500 mg | INTRAMUSCULAR | Status: DC | PRN

## 2013-02-10 MED ORDER — METOCLOPRAMIDE HCL 5 MG/ML IJ SOLN: 5.0000 mg | Freq: Three times a day (TID) | INTRAMUSCULAR | Status: DC | PRN

## 2013-02-10 MED ORDER — MIDAZOLAM HCL 5 MG/5ML IJ SOLN
INTRAMUSCULAR | Status: DC | PRN
  Administered 2013-02-10: 2 mg via INTRAVENOUS

## 2013-02-10 MED ORDER — ONDANSETRON HCL 4 MG/2ML IJ SOLN
INTRAMUSCULAR | Status: DC | PRN
  Administered 2013-02-10: 4 mg via INTRAVENOUS

## 2013-02-10 MED ORDER — OXYCODONE HCL 5 MG PO TABS
ORAL_TABLET | ORAL | Status: AC
  Administered 2013-02-10: 5 mg via ORAL
  Filled 2013-02-10: qty 1

## 2013-02-10 MED ORDER — VANCOMYCIN HCL 500 MG IV SOLR
INTRAVENOUS | Status: DC | PRN
  Administered 2013-02-10: 500 mg

## 2013-02-10 MED ORDER — FENTANYL CITRATE 0.05 MG/ML IJ SOLN
INTRAMUSCULAR | Status: DC | PRN
  Administered 2013-02-10: 100 ug via INTRAVENOUS

## 2013-02-10 MED ORDER — PROPOFOL 10 MG/ML IV BOLUS
INTRAVENOUS | Status: DC | PRN
  Administered 2013-02-10: 150 mg via INTRAVENOUS

## 2013-02-10 MED ORDER — 0.9 % SODIUM CHLORIDE (POUR BTL) OPTIME
TOPICAL | Status: DC | PRN
  Administered 2013-02-10: 1000 mL

## 2013-02-10 SURGICAL SUPPLY — 39 items
BANDAGE GAUZE ELAST BULKY 4 IN (GAUZE/BANDAGES/DRESSINGS) ×2 IMPLANT
BLADE SAW SGTL HD 18.5X60.5X1. (BLADE) ×2 IMPLANT
BLADE SURG 10 STRL SS (BLADE) IMPLANT
BNDG COHESIVE 4X5 TAN STRL (GAUZE/BANDAGES/DRESSINGS) ×2 IMPLANT
CLOTH BEACON ORANGE TIMEOUT ST (SAFETY) ×2 IMPLANT
COVER SURGICAL LIGHT HANDLE (MISCELLANEOUS) ×2 IMPLANT
CUFF TOURNIQUET SINGLE 34IN LL (TOURNIQUET CUFF) IMPLANT
CUFF TOURNIQUET SINGLE 44IN (TOURNIQUET CUFF) IMPLANT
DRAPE U-SHAPE 47X51 STRL (DRAPES) ×2 IMPLANT
DRSG ADAPTIC 3X8 NADH LF (GAUZE/BANDAGES/DRESSINGS) ×2 IMPLANT
DRSG PAD ABDOMINAL 8X10 ST (GAUZE/BANDAGES/DRESSINGS) ×2 IMPLANT
DURAPREP 26ML APPLICATOR (WOUND CARE) ×2 IMPLANT
ELECT REM PT RETURN 9FT ADLT (ELECTROSURGICAL) ×2
ELECTRODE REM PT RTRN 9FT ADLT (ELECTROSURGICAL) ×1 IMPLANT
GLOVE BIOGEL PI IND STRL 9 (GLOVE) ×1 IMPLANT
GLOVE BIOGEL PI INDICATOR 9 (GLOVE) ×1
GLOVE SURG ORTHO 9.0 STRL STRW (GLOVE) ×2 IMPLANT
GOWN PREVENTION PLUS XLARGE (GOWN DISPOSABLE) ×2 IMPLANT
GOWN SRG XL XLNG 56XLVL 4 (GOWN DISPOSABLE) ×2 IMPLANT
GOWN STRL NON-REIN XL XLG LVL4 (GOWN DISPOSABLE) ×2
KIT BASIN OR (CUSTOM PROCEDURE TRAY) ×2 IMPLANT
KIT ROOM TURNOVER OR (KITS) ×2 IMPLANT
KIT STIMULAN RAPID CURE 5CC (Orthopedic Implant) ×2 IMPLANT
MANIFOLD NEPTUNE II (INSTRUMENTS) ×2 IMPLANT
NS IRRIG 1000ML POUR BTL (IV SOLUTION) ×2 IMPLANT
PACK ORTHO EXTREMITY (CUSTOM PROCEDURE TRAY) ×2 IMPLANT
PAD ARMBOARD 7.5X6 YLW CONV (MISCELLANEOUS) ×4 IMPLANT
PAD CAST 4YDX4 CTTN HI CHSV (CAST SUPPLIES) ×1 IMPLANT
PADDING CAST COTTON 4X4 STRL (CAST SUPPLIES) ×1
SPONGE GAUZE 4X4 12PLY (GAUZE/BANDAGES/DRESSINGS) ×2 IMPLANT
SPONGE LAP 18X18 X RAY DECT (DISPOSABLE) ×2 IMPLANT
STAPLER VISISTAT 35W (STAPLE) ×2 IMPLANT
SUT ETHILON 2 0 PSLX (SUTURE) ×6 IMPLANT
SUT VIC AB 2-0 CTB1 (SUTURE) ×4 IMPLANT
TOWEL OR 17X24 6PK STRL BLUE (TOWEL DISPOSABLE) ×2 IMPLANT
TOWEL OR 17X26 10 PK STRL BLUE (TOWEL DISPOSABLE) ×2 IMPLANT
TUBE CONNECTING 12X1/4 (SUCTIONS) ×2 IMPLANT
WATER STERILE IRR 1000ML POUR (IV SOLUTION) ×2 IMPLANT
YANKAUER SUCT BULB TIP NO VENT (SUCTIONS) ×2 IMPLANT

## 2013-02-10 NOTE — Progress Notes (Signed)
[name], female   DOB: 1956-08-01, 57 y.o.   MRN: [id number] Cellulitis improving right foot however she still has an abscess dorsal lateral aspect of the foot. Will plan for surgery as add on today approximately 5 PM. Debridement of the abscess partial ray amputation of the fourth or fifth ray and placement of antibiotic beads.

## 2013-02-10 NOTE — Progress Notes (Signed)
ANTIBIOTIC CONSULT NOTE - FOLLOW UP  Pharmacy Consult for Vancomycin, Primaxin Indication: Cellulitis  Allergies  Allergen Reactions  . Fish-Derived Products Hives, Shortness Of Breath, Swelling and Rash    Hives get in throat causing trouble breathing  . Mushroom Extract Complex Anaphylaxis  . Penicillins Anaphylaxis  . Tomato Hives and Shortness Of Breath    Hives in throat causes her trouble breathing    Patient Measurements: Height: 4' 11.84" (152 cm) Weight: 162 lb 7.7 oz (73.7 kg) IBW/kg (Calculated) : 45.14  Vital Signs: Temp: 97.9 F (36.6 C) (04/14 0830) Temp src: Oral (04/14 0830) BP: 136/70 mmHg (04/14 0830) Pulse Rate: 83 (04/14 0830) Intake/Output from previous day:   Intake/Output from this shift:    Labs:  Recent Labs  02/09/13 0856 02/10/13 0730  WBC 8.8 8.9  HGB 11.0* 10.9*  PLT 263 243  CREATININE  --  0.77   Estimated Creatinine Clearance: 69.2 ml/min (by C-G formula based on Cr of 0.77). No results found for this basename: VANCOTROUGH, VANCOPEAK, VANCORANDOM, GENTTROUGH, GENTPEAK, GENTRANDOM, TOBRATROUGH, TOBRAPEAK, TOBRARND, AMIKACINPEAK, AMIKACINTROU, AMIKACIN,  in the last 72 hours    Assessment: 55 YOF with anaphylaxis to PCN continues on vancomycin (day 8) & primaxin (day 7)  for foot ulcer/cellulitis. Noted plans for OR this evening for debridement of the abscess and partial ray amputation and placement of antibiotic beads. No cultures have been done, patient is MRSA PCR positive. Patient remains afebrile, WBC is nml. SCr is stable and normal as well. Noted Vanc trough on 4/12 was 10.4, and Vanc dose was adjusted then.   Goal of Therapy:  Vancomycin trough level 10-15 mcg/ml  Plan:  - Continue Primaxin 583m IV q 8h - Continue Vancomycin 1gm IV q 12h - Will plan to recheck Vanc trough if therapy continues > 2 more days - Once I & D/amputation (source control) done, consider placing a stop time for 24h from AET. - Will monitor renal  fn and clinical status daily.  Thanks, Kishan Wachsmuth K. PPosey Pronto PharmD, BCPS.  Clinical Pharmacist Pager [phone] 02/10/2013 2:08 PM

## 2013-02-10 NOTE — Progress Notes (Signed)
[facility] PROGRESS NOTE  [name] [name] IRW:[id number] DOB: November 30, 1955 DOA: 02/03/2013 PCP: Garnet Koyanagi, DO  Assessment/Plan: Active Problems:   Type II or unspecified type diabetes mellitus with unspecified complication, uncontrolled   HYPERLIPIDEMIA   ANXIETY   HYPERTENSION   Cirrhosis of liver without mention of alcohol   Cellulitis and abscess of foot   Diabetic foot ulcer associated with type 2 diabetes mellitus   Hyponatremia   Hypokalemia   Anemia   Dehydration    Diabetic foot ulcer on right lower extremity with cellulitis of the foot  -continue vancomycin and primaxin -MRI demonstrating abscess and tenosynovitis; concerns that antibiotics alone might not be enough at this point and she needs surgical debridement. -Plan is for surgery today to clean abscess and assess needs of amputation. (Dr. Sharol Given Orthopedic doctor to perform surgery) -ABI results are normal (probably will be ok for debridement and minimal amputation) -CRP and ESR elevated (14 and 102 respectively); ESR also improving and trending down (86 on 4/14) -continue PRN analgesics -WBC's WNL now and patient afebrile.  Uncontrolled type 2 diabetes  -Resistant sliding scale. Continue home Lantus. Hemoglobin A1c 12.1 (down from 14) sugesting blood sugars have been uncontrolled at home; but slightly better.  -CBG's now within 120-160 range. -continue SSI and lantus  Hypokalemia and hyponatremia  Likely due to uncontrolled diabetes.  Replace potassium as needed. Suspect hyponatremia is likely due to hyperosmolar hyperglycemic nonketotic state from uncontrolled diabetes. Now improving, Continue fluid resuscitation.  -Sodium 130 and potassium repleted. -continue monitoring and replete as needed  Dehydration  Improved after IVF's. Good urine output Will change IVF to NSL  Anemia  Likely due to chronic disease. Anemia panel corroborates diagnosis of AOCD.  -Will monitor Hgb especially after  surgery.  Hypertension  Stable. Continue low sodium diet.  Neuropathy: -continue gabapentin -continue lyrica.  Hyperlipidemia  Stable. Cont statins  Anxiety: Will continue buspar.  Prophylaxis  Lovenox.   Code Status: full Family Communication:No family at bedside Disposition Plan:  To be detrmine    Consultants:  Orthopedics  Procedures:  See below for x-ray results  Antibiotics:  Vancomycin  primaxin  HPI/Subjective: Reports pain on her foot is still present but better. Mild intermittent complaints of leg pain (hip and thigh). Less anxious. Afebrile  Objective: Filed Vitals:   02/09/13 1457 02/09/13 2120 02/10/13 0550 02/10/13 0830  BP: 124/68 124/76 147/75 136/70  Pulse: 80 92 82 83  Temp: 98 F (36.7 C) 97.8 F (36.6 C) 97.9 F (36.6 C) 97.9 F (36.6 C)  TempSrc: Oral Oral Oral Oral  Resp: 16 16 16 12   Height:      Weight:   73.7 kg (162 lb 7.7 oz)   SpO2: 95% 97% 94% 96%   No intake or output data in the 24 hours ending 02/10/13 1452  Exam: GEN: AAOX3, no fever, complaining of mild right hip and right foot pain Cardio: regular rate and rhythm, S1, S2 normal, no murmur, rubs or gallops Lungs: CTA bilaterally GI: soft, non-tender; bowel sounds normal; no masses, no organomegaly  Extremities: RLE with Good pulses. Trace edema. 2 ulcerated lesions in the right foot in the medial and lateral aspect, medial ulcer is about quarter-sized size and lateral ulcer is about 4-5 centimeters in size. Surrounding erythema on the dorsal aspect of the foot noted. Erythema is better   Data Reviewed: Basic Metabolic Panel:  Recent Labs Lab 02/04/13 0606 02/06/13 0520 02/10/13 0730  NA 130* 132* 139  K 3.6 4.3  3.9  CL 90* 96 103  CO2 30 30 31   GLUCOSE 290* 222* 151*  BUN 15 11 11   CREATININE 0.86 0.81 0.77  CALCIUM 8.7 8.6 8.8  MG 2.2  --   --     CBC:  Recent Labs Lab 02/04/13 0606 02/06/13 0520 02/09/13 0856 02/10/13 0730  WBC 10.9* 12.3*  8.8 8.9  HGB 10.7* 11.0* 11.0* 10.9*  HCT 30.0* 32.7* 33.0* 32.2*  MCV 75.9* 78.4 79.1 78.5  PLT 228 264 263 243    CBG:  Recent Labs Lab 02/09/13 1216 02/09/13 1712 02/09/13 2123 02/10/13 0746 02/10/13 1208  GLUCAP 163* 232* 186* 131* 100*    Studies: Dg Chest 2 View  02/03/2013  *RADIOLOGY REPORT*  Clinical Data: Fall with left-sided rib pain.  CHEST - 2 VIEW  Comparison: 10/15/2012  Findings: Chest x-ray shows mild bibasilar atelectasis.  No pneumothorax, focal consolidation or pleural fluid is identified. The heart size is normal.  Visualized bony structures show no obvious acute rib fractures.  There is chronic deformity of the proximal left humerus related to prior fracture.  IMPRESSION: Mild bibasilar atelectasis.   Original Report Authenticated By: Aletta Edouard, M.D.    Dg Hip Complete Right  02/03/2013  *RADIOLOGY REPORT*  Clinical Data: Fall with right hip pain.  RIGHT HIP - COMPLETE 2+ VIEW  Comparison:  None.  Findings:  There is no evidence of hip fracture or dislocation. There is no evidence of arthropathy or other focal bone abnormality. The bony pelvis is intact.  IMPRESSION: Negative.   Original Report Authenticated By: Aletta Edouard, M.D.    Ct Abdomen Pelvis W Contrast  02/03/2013  *RADIOLOGY REPORT*  Clinical Data: Fall 5 days ago.  Right lower quadrant pain. Nausea.  History of "liver cancer."  Diabetes.  Hypertension.  CT ABDOMEN AND PELVIS WITH CONTRAST  Technique:  Multidetector CT imaging of the abdomen and pelvis was performed following the standard protocol during bolus administration of intravenous contrast.  Contrast: 1 OMNIPAQUE IOHEXOL 300 MG/ML  SOLN, 121m OMNIPAQUE IOHEXOL 300 MG/ML  SOLN  Comparison: 07/06/2010  Findings: Lung bases:  Clear lung bases.  Mild cardiomegaly.  A tiny hiatal hernia. Prominent right cardiophrenic angle node on image 10/series 2 is chronic and reactive.  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  Prominent left portal vein on image 18/series 2, similar to 06/04/2007.  Persistent splenomegaly, with the spleen measuring 13.7 cm cranial caudal.  Normal stomach, pancreas. Cholecystectomy without biliary ductal dilatation.  Normal adrenal glands.  Bilateral heterogeneous renal enhancement, especially on the delayed series seven.  Mildly prominent retroperitoneal nodes which are likely reactive and are unchanged.  Colonic stool burden suggests constipation.  Normal terminal ileum and appendix.  No pneumatosis or free intraperitoneal air.  Normal small bowel without abdominal ascites.  1.3 cm right external iliac node on image 84/series 2 is new since 07/06/2010.  There are also prominent right inguinal nodes which are newly enlarged.  Normal urinary bladder.  Hysterectomy. No significant free fluid.  Bones/Musculoskeletal:  No acute osseous abnormality.  IMPRESSION:  1.  No acute post-traumatic deformity identified. Heterogeneous renal enhancement is suspicious for bilateral pyelonephritis. 2.  Moderate cirrhosis with persistent splenomegaly. 3. Possible constipation. 4.  Normal appendix. 5.  Mild adenopathy in the right external iliac station with prominent right inguinal nodes.  Favored to be reactive.  Not in the typical drainage pattern for liver cancer metastasis.  Consider physical exam correlation.  Original Report Authenticated By: Abigail Miyamoto, M.D.    Dg Foot Complete Right  02/03/2013  *RADIOLOGY REPORT*  Clinical Data: Diabetic foot.  Redness and swelling.  Lateral sore on bottom of foot at  head of first metatarsal.  RIGHT FOOT COMPLETE - 3+ VIEW  Comparison: None.  Findings: Minimal hallux valgus deformity.  There may be soft tissue swelling about the medial aspect of the first metatarsal phalangeal joint.  No osseous destruction.  Achilles and calcaneal spurs.  Probable dorsal forefoot soft tissue swelling as well.  No soft tissue gas or radiopaque foreign  object.  Possible skin/soft tissue ulcer about the fifth metatarsal phalangeal joint, most apparent on the oblique view.  IMPRESSION: No acute osseous abnormality.  Possible soft tissue defect about the lateral aspect of the fifth MTP joint.  Relatively diffuse soft tissue swelling about the forefoot.   Original Report Authenticated By: Abigail Miyamoto, M.D.     Scheduled Meds: . atorvastatin  10 mg Oral q1800  . busPIRone  5 mg Oral TID  . calcium-vitamin D  1 tablet Oral Daily  . cholecalciferol  2,000 Units Oral Daily  . DULoxetine  60 mg Oral Daily  . enoxaparin (LOVENOX) injection  40 mg Subcutaneous Q24H  . escitalopram  10 mg Oral Daily  . gabapentin  300 mg Oral TID  . imipenem-cilastatin  500 mg Intravenous Q8H  . insulin aspart  0-20 Units Subcutaneous TID WC  . insulin aspart  0-5 Units Subcutaneous QHS  . insulin aspart  10 Units Subcutaneous TID WC  . insulin glargine  45 Units Subcutaneous QHS  . metaxalone  800 mg Oral TID  . multivitamin with minerals  1 tablet Oral Daily  . polyethylene glycol  17 g Oral Daily  . pregabalin  50 mg Oral BID  . vancomycin  1,000 mg Intravenous Q12H  . vitamin C  500 mg Oral Daily   Continuous Infusions:    Active Problems:   Type II or unspecified type diabetes mellitus with unspecified complication, uncontrolled   HYPERLIPIDEMIA   ANXIETY   HYPERTENSION   Cirrhosis of liver without mention of alcohol   Cellulitis and abscess of foot   Diabetic foot ulcer associated with type 2 diabetes mellitus   Hyponatremia   Hypokalemia   Anemia   Dehydration  Time spent: < 30 minutes   Donelle Baba  [facility] Pager [phone]. If 8PM-8AM, please contact night-coverage at [email or web address], password [facility] 02/10/2013, 2:52 PM  LOS: 7 days

## 2013-02-10 NOTE — Progress Notes (Signed)
Orthopedic Tech Progress Note Patient Details:  [name] [name] 02-Oct-1956 [id number]  Ortho Devices Type of Ortho Device: Postop shoe/boot Ortho Device/Splint Location: RLE Ortho Device/Splint Interventions: Ordered;Application   [name] 02/10/2013, 10:35 PM

## 2013-02-10 NOTE — Anesthesia Procedure Notes (Signed)
Procedure Name: LMA Insertion Date/Time: 02/10/2013 6:38 PM Performed by: Ned Grace Pre-anesthesia Checklist: Patient identified, Timeout performed, Emergency Drugs available, Suction available and Patient being monitored Patient Re-evaluated:Patient Re-evaluated prior to inductionOxygen Delivery Method: Circle system utilized Preoxygenation: Pre-oxygenation with 100% oxygen Intubation Type: IV induction LMA: LMA inserted LMA Size: 4.0 Number of attempts: 1 Placement Confirmation: breath sounds checked- equal and bilateral and positive ETCO2 Tube secured with: Tape Dental Injury: Teeth and Oropharynx as per pre-operative assessment

## 2013-02-10 NOTE — Progress Notes (Signed)
ANTICOAGULATION CONSULT NOTE - Initial Consult  Pharmacy Consult for coumadin Indication: VTE prophylaxis  Allergies  Allergen Reactions  . Fish-Derived Products Hives, Shortness Of Breath, Swelling and Rash    Hives get in throat causing trouble breathing  . Mushroom Extract Complex Anaphylaxis  . Penicillins Anaphylaxis  . Tomato Hives and Shortness Of Breath    Hives in throat causes her trouble breathing    Patient Measurements: Height: 4' 11.84" (152 cm) Weight: 162 lb 7.7 oz (73.7 kg) IBW/kg (Calculated) : 45.14   Vital Signs: Temp: 98.2 F (36.8 C) (04/14 1925) BP: 119/65 mmHg (04/14 2055) Pulse Rate: 85 (04/14 2105)  Labs:  Recent Labs  02/09/13 0856 02/10/13 0730  HGB 11.0* 10.9*  HCT 33.0* 32.2*  PLT 263 243  CREATININE  --  0.77    Estimated Creatinine Clearance: 69.2 ml/min (by C-G formula based on Cr of 0.77).   Medications:  Scheduled:  . atorvastatin  10 mg Oral q1800  . busPIRone  5 mg Oral TID  . calcium-vitamin D  1 tablet Oral Daily  . cholecalciferol  2,000 Units Oral Daily  . DULoxetine  60 mg Oral Daily  . enoxaparin (LOVENOX) injection  40 mg Subcutaneous Q24H  . escitalopram  10 mg Oral Daily  . gabapentin  300 mg Oral TID  . imipenem-cilastatin  500 mg Intravenous Q8H  . insulin aspart  0-20 Units Subcutaneous TID WC  . insulin aspart  0-5 Units Subcutaneous QHS  . insulin aspart  10 Units Subcutaneous TID WC  . insulin glargine  45 Units Subcutaneous QHS  . metaxalone  800 mg Oral TID  . multivitamin with minerals  1 tablet Oral Daily  . polyethylene glycol  17 g Oral Daily  . pregabalin  50 mg Oral BID  . vancomycin  1,000 mg Intravenous Q12H  . vitamin C  500 mg Oral Daily  . [DISCONTINUED] vancomycin  1,000 mg Intravenous Q12H    Assessment: 57 yo female s/p partial right foot amputation and debridement. Patient to begin coumadin for VTE prophylaxis. Patient noted on lovenox 41m sq q24hr.  No baseline INR. Daily INR  ordered beginning 02/10/13.   Goal of Therapy:  INR 2-3 Monitor platelets by anticoagulation protocol: Yes   Plan:  -Will not order further labs today -Start coumadin on 02/11/13 when a baseline INR is available  AHildred Laser Pharm D 02/10/2013 9:42 PM

## 2013-02-10 NOTE — Anesthesia Preprocedure Evaluation (Signed)
Anesthesia Evaluation  Patient identified by MRN, date of birth, ID band Patient awake    Reviewed: Allergy & Precautions, H&P , NPO status , Patient's Chart, lab work & pertinent test results  Airway Mallampati: I TM Distance: >3 FB Neck ROM: Full    Dental   Pulmonary  breath sounds clear to auscultation        Cardiovascular hypertension, + CAD and + Past MI Rhythm:Regular Rate:Normal     Neuro/Psych Anxiety  Neuromuscular disease    GI/Hepatic GERD-  ,  Endo/Other  diabetes  Renal/GU      Musculoskeletal  (+) Fibromyalgia -  Abdominal (+) + obese,   Peds  Hematology   Anesthesia Other Findings   Reproductive/Obstetrics                           Anesthesia Physical Anesthesia Plan  ASA: III  Anesthesia Plan: General   Post-op Pain Management:    Induction: Intravenous  Airway Management Planned: LMA  Additional Equipment:   Intra-op Plan:   Post-operative Plan: Extubation in OR  Informed Consent: I have reviewed the patients History and Physical, chart, labs and discussed the procedure including the risks, benefits and alternatives for the proposed anesthesia with the patient or authorized representative who has indicated his/her understanding and acceptance.     Plan Discussed with: CRNA and Surgeon  Anesthesia Plan Comments:         Anesthesia Quick Evaluation

## 2013-02-10 NOTE — Anesthesia Postprocedure Evaluation (Signed)
  Anesthesia Post-op Note  Patient: [name] [name]  Procedure(s) Performed: Procedure(s) with comments: AMPUTATION FOOT (Right) - Right Partial Foot Amputation/place antibotic beads  Patient Location: PACU  Anesthesia Type:General  Level of Consciousness: awake  Airway and Oxygen Therapy: Patient Spontanous Breathing and Patient connected to nasal cannula oxygen  Post-op Pain: mild  Post-op Assessment: Post-op Vital signs reviewed  Post-op Vital Signs: Reviewed  Complications: No apparent anesthesia complications

## 2013-02-10 NOTE — Op Note (Signed)
OPERATIVE REPORT  DATE OF SURGERY: 02/10/2013  PATIENT:  [name] [name],  57 y.o. female  PRE-OPERATIVE DIAGNOSIS:  Abscess Right Foot  POST-OPERATIVE DIAGNOSIS:  Abscess Right Foot  PROCEDURE:  Procedure(s): AMPUTATION FOOT Fifth ray right foot. Excision of skin soft tissue and muscle. Application of antibiotic beads with 500 mg vancomycin and 160 mg gentamicin. Local tissue rearrangement for a wound 8 x 6 cm.  SURGEON:  Surgeon(s): [name], MD  ANESTHESIA:   general  EBL:  Minimal ML  SPECIMEN:  No Specimen  TOURNIQUET:  * No tourniquets in log *  PROCEDURE DETAILS: Patient is a 57 year old woman with uncontrolled diabetes who presents with a mass of abscess dorsal lateral aspect of her right foot. Patient initially was resistant to surgical intervention and presents at this time for surgical intervention for the persistent abscess and necrotic tissue dorsolateral aspect of the right foot. Risks and benefits were discussed including persistent infection nonhealing of the wound potential for amputation. Patient states she understands and wished to proceed at this time. Description of procedure patient was brought to the operating room and underwent a general anesthetic. After adequate levels of anesthesia were obtained patient's right lower extremity was prepped using DuraPrep and draped into a sterile field. Patient had a large Wagner grade 1 ulcer beneath the first metatarsal head a 10 blade knife was used to excise the ulcer back to bleeding viable granulation tissue the wound is 3 cm in diameter. Patient had a large extensile incision over the fifth ray to ellipse out the necrotic wounds which were 3 cm in diameter. There is a large purulent abscess and this was sent for cultures and sensitivity. The fifth ray was resected along with the abscess in one block of tissue. The wound was irrigated with normal saline. Exposed necrotic tendon was excised exposed necrotic muscle was  excised. After debridement there was petechial bleeding. The dorsal soft tissue flap was extremely thin and its viability is questionable. The wound was closed without tension the skin using 2-0 nylon. The wound was closed using local tissue rearrangement to close the wound except for one area which approximately 5 mm x 3 mm. Antibiotic beads were mixed on the back table placed within the wound with 500 mg vancomycin and 160 mg gentamicin. Was closed using 2-0 nylon. The wound was covered [employer] orthopedics dressings ABDs Kerlix and Coban. Patient was extubated taken to the PACU in stable condition plan to continue her IV antibiotics.  PLAN OF CARE: Admit to inpatient   PATIENT DISPOSITION:  PACU - hemodynamically stable.   [name], MD 02/10/2013 7:17 PM

## 2013-02-10 NOTE — Transfer of Care (Signed)
Immediate Anesthesia Transfer of Care Note  Patient: [name] [name]  Procedure(s) Performed: Procedure(s) with comments: AMPUTATION FOOT (Right) - Right Partial Foot Amputation/place antibotic beads  Patient Location: PACU  Anesthesia Type:General  Level of Consciousness: awake, sedated and patient cooperative  Airway & Oxygen Therapy: Patient Spontanous Breathing and Patient connected to nasal cannula oxygen  Post-op Assessment: Report given to PACU RN, Post -op Vital signs reviewed and stable   Post vital signs: Reviewed and stable  Complications: No apparent anesthesia complications

## 2013-02-10 NOTE — Progress Notes (Signed)
PT Cancellation Note  Patient Details Name: [name] [name] MRN: [id number] DOB: Apr 24, 1956   Cancelled Treatment:    Reason Eval/Treat Not Completed: Other (comment) (pt polite refusal--stating already up) 02/10/2013  Donnella Sham, [place] [phone] (pager)   Arriel Victor, Tessie Fass 02/10/2013, 11:31 AM

## 2013-02-11 ENCOUNTER — Encounter (HOSPITAL_COMMUNITY): Payer: Self-pay | Admitting: Orthopedic Surgery

## 2013-02-11 LAB — PROTIME-INR: Prothrombin Time: 14.4 seconds (ref 11.6–15.2)

## 2013-02-11 LAB — GLUCOSE, CAPILLARY
Glucose-Capillary: 172 mg/dL — ABNORMAL HIGH (ref 70–99)
Glucose-Capillary: 196 mg/dL — ABNORMAL HIGH (ref 70–99)
Glucose-Capillary: 201 mg/dL — ABNORMAL HIGH (ref 70–99)

## 2013-02-11 MED ORDER — HYDROMORPHONE HCL PF 1 MG/ML IJ SOLN
1.0000 mg | INTRAMUSCULAR | Status: DC | PRN
  Administered 2013-02-11 – 2013-02-13 (×11): 1 mg via INTRAVENOUS
  Filled 2013-02-11 (×10): qty 1

## 2013-02-11 MED ORDER — LORAZEPAM 0.5 MG PO TABS
0.5000 mg | ORAL_TABLET | Freq: Once | ORAL | Status: AC
  Administered 2013-02-11: 0.5 mg via ORAL
  Filled 2013-02-11: qty 1

## 2013-02-11 MED ORDER — HYDROMORPHONE HCL PF 1 MG/ML IJ SOLN
1.0000 mg | INTRAMUSCULAR | Status: AC
  Administered 2013-02-11: 1 mg via INTRAVENOUS
  Filled 2013-02-11: qty 1

## 2013-02-11 MED ORDER — DOXYCYCLINE HYCLATE 100 MG PO TABS
100.0000 mg | ORAL_TABLET | Freq: Two times a day (BID) | ORAL | Status: DC
  Administered 2013-02-12 – 2013-02-20 (×17): 100 mg via ORAL
  Filled 2013-02-11 (×18): qty 1

## 2013-02-11 NOTE — Progress Notes (Signed)
[name], female   DOB: 1956-09-17, 57 y.o.   MRN: [id number] Postoperative day 1 right foot debridement abscess placement of antibiotic beads and fifth ray amputation. Cultures are pending.  Patient will need by mouth antibiotics for 1 month pending the culture results.  Nonweightbearing right lower extremity patient keep incision clean and dry until followup. I'll followup in the office in one week.

## 2013-02-11 NOTE — Progress Notes (Signed)
Rehab Admissions Coordinator Note:  Patient was screened by Flo Shanks for appropriateness for an Inpatient Acute Rehab Consult.  At this time, we are recommending Inpatient Rehab consult.  Flo Shanks 02/11/2013, 5:03 PM  I can be reached at [phone].

## 2013-02-11 NOTE — Progress Notes (Addendum)
[facility] PROGRESS NOTE  [name] [name] LZJ:[id number] DOB: [birth year] DOA: 02/03/2013 PCP: Garnet Koyanagi, DO  Assessment/Plan: Active Problems:   Type II or unspecified type diabetes mellitus with unspecified complication, uncontrolled   HYPERLIPIDEMIA   ANXIETY   HYPERTENSION   Cirrhosis of liver without mention of alcohol   Cellulitis and abscess of foot   Diabetic foot ulcer associated with type 2 diabetes mellitus   Hyponatremia   Hypokalemia   Anemia   Dehydration   Diabetic foot ulcer on right lower extremity with cellulitis of the foot  -continue vancomycin and primaxin for 1 more day and then switch to doxycycline to complete a total of 4 weeks of antibiotics. 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 Doing ok and stable. (follow up with Dr. Sharol Given in his office in 1 week, no weight bearing on right foot) -ABI results are normal (probably will be ok for debridement and minimal amputation) -CRP and ESR elevated (14 and 102 respectively); ESR also improving and trending down (86 on 4/14) -continue PRN analgesics -WBC's WNL now and patient afebrile.  Uncontrolled type 2 diabetes  -Resistant sliding scale. Continue home Lantus. Hemoglobin A1c 12.1 (down from 14) sugesting blood sugars have been uncontrolled at home; but slightly better.  -CBG's now within 120-150 range. -continue SSI and lantus  Hypokalemia and hyponatremia  Likely due to uncontrolled diabetes.  Replace potassium as needed. Suspect hyponatremia is likely due to hyperosmolar hyperglycemic nonketotic state from uncontrolled diabetes. Now improving, Continue fluid resuscitation.  -Sodium 139 and potassium repleted (K3.9). -continue monitoring and replete as needed  Dehydration  Improved after IVF's. Good urine output Will change IVF to NSL  Anemia  Likely due to chronic disease. Anemia panel corroborates diagnosis of AOCD.  -Will monitor Hgb  especially after surgery; CBC in am  Hypertension  Stable. Continue low sodium diet.  Neuropathy: -continue gabapentin -continue lyrica.  Hyperlipidemia  Stable. Cont statins  Anxiety/depression: Will continue buspar and cymbalta.  Prophylaxis  Lovenox.   Code Status: full Family Communication:No family at bedside Disposition Plan:  To be detrmine after PT/OT evaluate on patient; no good support at home according to her; 2 story house and can not bear weight on her right foot for 1 week as recommended by orthopedic service. CIR vs SNF   Consultants:  Orthopedics  Procedures:  See below for x-ray results  Antibiotics:  Vancomycin 02/03/13--02/11/13  primaxin 02/03/13--02/11/13  Doxycycline 02/12/13 (plan is for a total of 3 weeks)  HPI/Subjective: Reports pain on her foot; no fever, no overnight complaints. 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 Afebrile  Objective: Filed Vitals:   02/10/13 2125 02/11/13 0259 02/11/13 0500 02/11/13 0615  BP: 112/67 112/57  96/54  Pulse: 87 81  84  Temp:  98.3 F (36.8 C)  97.1 F (36.2 C)  TempSrc:  Oral  Oral  Resp: 18 16  18   Height:      Weight:   73.8 kg (162 lb 11.2 oz)   SpO2: 97% 98%  92%    Intake/Output Summary (Last 24 hours) at 02/11/13 1111 Last data filed at 02/11/13 0600  Gross per 24 hour  Intake   2100 ml  Output    900 ml  Net   1200 ml    Exam: GEN: AAOX3, no fever, complaining of mild foot pain Cardio: regular rate and rhythm, S1, S2 normal, no  murmur, rubs or gallops Lungs: CTA bilaterally GI: soft, non-tender; bowel sounds normal; no masses, no organomegaly  Extremities: RLE with Good pulses; Trace edema. Clean gauzes and dressing after fifth ray amputation and surgical debridement on 4/15  Data Reviewed: Basic Metabolic Panel:  Recent Labs Lab 02/06/13 0520 02/10/13 0730  NA 132* 139  K 4.3 3.9  CL 96  103  CO2 30 31  GLUCOSE 222* 151*  BUN 11 11  CREATININE 0.81 0.77  CALCIUM 8.6 8.8    CBC:  Recent Labs Lab 02/06/13 0520 02/09/13 0856 02/10/13 0730  WBC 12.3* 8.8 8.9  HGB 11.0* 11.0* 10.9*  HCT 32.7* 33.0* 32.2*  MCV 78.4 79.1 78.5  PLT 264 263 243    CBG:  Recent Labs Lab 02/10/13 1642 02/10/13 1808 02/10/13 1926 02/10/13 2137 02/11/13 0746  GLUCAP 82 89 88 79 172*    Studies: Dg Chest 2 View  02/03/2013  *RADIOLOGY REPORT*  Clinical Data: Fall with left-sided rib pain.  CHEST - 2 VIEW  Comparison: 10/15/2012  Findings: Chest x-ray shows mild bibasilar atelectasis.  No pneumothorax, focal consolidation or pleural fluid is identified. The heart size is normal.  Visualized bony structures show no obvious acute rib fractures.  There is chronic deformity of the proximal left humerus related to prior fracture.  IMPRESSION: Mild bibasilar atelectasis.   Original Report Authenticated By: Aletta Edouard, M.D.    Dg Hip Complete Right  02/03/2013  *RADIOLOGY REPORT*  Clinical Data: Fall with right hip pain.  RIGHT HIP - COMPLETE 2+ VIEW  Comparison:  None.  Findings:  There is no evidence of hip fracture or dislocation. There is no evidence of arthropathy or other focal bone abnormality. The bony pelvis is intact.  IMPRESSION: Negative.   Original Report Authenticated By: Aletta Edouard, M.D.    Ct Abdomen Pelvis W Contrast  02/03/2013  *RADIOLOGY REPORT*  Clinical Data: Fall 5 days ago.  Right lower quadrant pain. Nausea.  History of "liver cancer."  Diabetes.  Hypertension.  CT ABDOMEN AND PELVIS WITH CONTRAST  Technique:  Multidetector CT imaging of the abdomen and pelvis was performed following the standard protocol during bolus administration of intravenous contrast.  Contrast: 1 OMNIPAQUE IOHEXOL 300 MG/ML  SOLN, 153m OMNIPAQUE IOHEXOL 300 MG/ML  SOLN  Comparison: 07/06/2010  Findings: Lung bases:  Clear lung bases.  Mild cardiomegaly.  A tiny hiatal hernia. Prominent right  cardiophrenic angle node on image 10/series 2 is chronic and reactive.  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  Prominent left portal vein on image 18/series 2, similar to 06/04/2007.  Persistent splenomegaly, with the spleen measuring 13.7 cm cranial caudal.  Normal stomach, pancreas. Cholecystectomy without biliary ductal dilatation.  Normal adrenal glands.  Bilateral heterogeneous renal enhancement, especially on the delayed series seven.  Mildly prominent retroperitoneal nodes which are likely reactive and are unchanged.  Colonic stool burden suggests constipation.  Normal terminal ileum and appendix.  No pneumatosis or free intraperitoneal air.  Normal small bowel without abdominal ascites.  1.3 cm right external iliac node on image 84/series 2 is new since 07/06/2010.  There are also prominent right inguinal nodes which are newly enlarged.  Normal urinary bladder.  Hysterectomy. No significant free fluid.  Bones/Musculoskeletal:  No acute osseous abnormality.  IMPRESSION:  1.  No acute post-traumatic deformity identified. Heterogeneous renal enhancement is suspicious for bilateral pyelonephritis. 2.  Moderate cirrhosis with persistent splenomegaly. 3. Possible constipation. 4.  Normal appendix.  5.  Mild adenopathy in the right external iliac station with prominent right inguinal nodes.  Favored to be reactive.  Not in the typical drainage pattern for liver cancer metastasis.  Consider physical exam correlation.   Original Report Authenticated By: Abigail Miyamoto, M.D.    Dg Foot Complete Right  02/03/2013  *RADIOLOGY REPORT*  Clinical Data: Diabetic foot.  Redness and swelling.  Lateral sore on bottom of foot at  head of first metatarsal.  RIGHT FOOT COMPLETE - 3+ VIEW  Comparison: None.  Findings: Minimal hallux valgus deformity.  There may be soft tissue swelling about the medial aspect of the first metatarsal phalangeal joint.  No osseous  destruction.  Achilles and calcaneal spurs.  Probable dorsal forefoot soft tissue swelling as well.  No soft tissue gas or radiopaque foreign object.  Possible skin/soft tissue ulcer about the fifth metatarsal phalangeal joint, most apparent on the oblique view.  IMPRESSION: No acute osseous abnormality.  Possible soft tissue defect about the lateral aspect of the fifth MTP joint.  Relatively diffuse soft tissue swelling about the forefoot.   Original Report Authenticated By: Abigail Miyamoto, M.D.     Scheduled Meds: . atorvastatin  10 mg Oral q1800  . busPIRone  5 mg Oral TID  . calcium-vitamin D  1 tablet Oral Daily  . cholecalciferol  2,000 Units Oral Daily  . [START ON 02/12/2013] doxycycline  100 mg Oral Q12H  . DULoxetine  60 mg Oral Daily  . enoxaparin (LOVENOX) injection  40 mg Subcutaneous Q24H  . escitalopram  10 mg Oral Daily  . gabapentin  300 mg Oral TID  . imipenem-cilastatin  500 mg Intravenous Q8H  . insulin aspart  0-20 Units Subcutaneous TID WC  . insulin aspart  0-5 Units Subcutaneous QHS  . insulin aspart  10 Units Subcutaneous TID WC  . insulin glargine  45 Units Subcutaneous QHS  . metaxalone  800 mg Oral TID  . multivitamin with minerals  1 tablet Oral Daily  . polyethylene glycol  17 g Oral Daily  . pregabalin  50 mg Oral BID  . vancomycin  1,000 mg Intravenous Q12H  . vitamin C  500 mg Oral Daily   Continuous Infusions:    Active Problems:   Type II or unspecified type diabetes mellitus with unspecified complication, uncontrolled   HYPERLIPIDEMIA   ANXIETY   HYPERTENSION   Cirrhosis of liver without mention of alcohol   Cellulitis and abscess of foot   Diabetic foot ulcer associated with type 2 diabetes mellitus   Hyponatremia   Hypokalemia   Anemia   Dehydration  Time spent: < 30 minutes   Sandi Towe  [facility] Pager [phone]. If 8PM-8AM, please contact night-coverage at [email or web address], password [facility] 02/11/2013, 11:11 AM  LOS: 8 days

## 2013-02-11 NOTE — Progress Notes (Signed)
OT Cancellation Note  Patient Details Name: [name] [name] MRN: [id number] DOB: February 13, 1956   Cancelled Treatment:    Reason Eval/Treat Not Completed: Medical issues which prohibited therapy (Please reorder OT services s/p surgery if indicated)  Kemaya Dorner A 02/11/2013, 10:18 AM

## 2013-02-11 NOTE — Progress Notes (Addendum)
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 Good initial mobility however limited by fatigue, weakness and pain. Will need post-acute rehab to insure patient can d/c home at modified independent level as she has limited assist at home. Goals reviewed and updated as appropriate.     Follow Up Recommendations  CIR     Does the patient have the potential to tolerate intense rehabilitation     Barriers to Discharge        Equipment Recommendations  Rolling walker with 5" wheels    Recommendations for Other Services    Frequency Min 3X/week   Plan Discharge plan needs to be updated;Frequency remains appropriate    Precautions / Restrictions Precautions Precautions: Fall Restrictions Weight Bearing Restrictions: Yes RLE Weight Bearing: Non weight bearing Other Position/Activity Restrictions: per Dr. Jess Barters note   Pertinent Vitals/Pain Reports foot pain unrated, RN reports pt has been premedicated    Mobility  Bed Mobility Bed Mobility: Supine to Sit Supine to Sit: 5: Supervision;With rails;HOB elevated (30 degrees) Transfers Transfers: Sit to Stand;Stand to Sit;Stand Pivot Transfers Sit to Stand: 4: Min assist;With upper extremity assist;From bed Stand to Sit: 4: Min assist;With upper extremity assist;3: Mod assist;To chair/3-in-1 Stand Pivot Transfers: 4: Min assist Details for Transfer Assistance: minA sequencign cues and stability assist initially with transfers, good ability to maintain NWB RLE; as the session progressed and she became more fatigued and distracted by her anxiety her safety level decreased and pt needing increased physical assist especially when turning to sit in the chair (mod  stability assist) Ambulation/Gait Ambulation/Gait Assistance: 4: Min assist;3: Mod assist Ambulation Distance (Feet): 10 Feet Assistive device: Rolling walker Ambulation/Gait Assistance Details: sequencing cues for hop to gait using RW, good technique initially needing minA stability assist however fatigues quickly requiring min-modA especially as she negotiates turns and tight spaces Gait velocity: decreased General Gait Details: hop-to pattern Stairs: No    Exercises General Exercises - Lower Extremity Ankle Circles/Pumps: AROM;Right;5 reps;Supine    PT Goals Acute Rehab PT Goals PT Goal Formulation: With patient Pt will go Supine/Side to Sit: Independently;with HOB 0 degrees PT Goal: Supine/Side to Sit - Progress: Progressing toward goal Pt will go Sit to Supine/Side: Independently PT Goal: Sit to Supine/Side - Progress: Progressing toward goal Pt will go Sit to Stand: with modified independence PT Goal: Sit to Stand - Progress: Progressing toward goal Pt will go Stand to Sit: with modified independence PT Goal: Stand to Sit - Progress: Progressing toward goal Pt will Transfer Bed to Chair/Chair to Bed: with modified independence PT Transfer Goal: Bed to Chair/Chair to Bed - Progress: Progressing toward goal Pt will Ambulate: with modified independence;16 - 50 feet PT Goal: Ambulate - Progress: Progressing toward goal (while maintaining NWB)  Visit Information  Last PT Received On: 02/11/13 Assistance Needed: +1 (2 helpful for progression of ambulation/chair follow)    Subjective Data  Subjective: You are going to have to come back because I this is my business and I have to make a phone call.  Patient Stated Goal: home, independent   Cognition  Cognition Overall Cognitive Status: Appears within functional limits for tasks assessed/performed Arousal/Alertness: Awake/alert Orientation Level: Appears intact for tasks assessed Behavior During Session: Anxious Cognition -  Other Comments: anxious about mobility and pain    Balance  Static Standing Balance Static Standing - Balance Support: Left upper extremity supported Static Standing - Level of Assistance: 4: Min assist;5: Stand by assistance Static Standing - Comment/# of Minutes: min-mingaurdA for standing activities, lost her balance a few times requiring minA as she was performing pericare in standing (LUE supported on RW)  End of Session PT - End of Session Equipment Utilized During Treatment: Gait belt Activity Tolerance: Patient limited by fatigue;Patient limited by pain Patient left: in chair;with call bell/phone within reach Nurse Communication: Mobility status   GP     [place] 02/11/2013, 3:34 PM

## 2013-02-12 DIAGNOSIS — L03119 Cellulitis of unspecified part of limb: Secondary | ICD-10-CM

## 2013-02-12 DIAGNOSIS — L02619 Cutaneous abscess of unspecified foot: Secondary | ICD-10-CM

## 2013-02-12 DIAGNOSIS — S98919A Complete traumatic amputation of unspecified foot, level unspecified, initial encounter: Secondary | ICD-10-CM

## 2013-02-12 LAB — GLUCOSE, CAPILLARY
Glucose-Capillary: 197 mg/dL — ABNORMAL HIGH (ref 70–99)
Glucose-Capillary: 169 mg/dL — ABNORMAL HIGH (ref 70–99)
Glucose-Capillary: 221 mg/dL — ABNORMAL HIGH (ref 70–99)
Glucose-Capillary: 159 mg/dL — ABNORMAL HIGH (ref 70–99)

## 2013-02-12 LAB — PROTIME-INR: INR: 1.19 (ref 0.00–1.49)

## 2013-02-12 MED ORDER — WARFARIN - PHARMACIST DOSING INPATIENT
Freq: Every day | Status: DC
  Administered 2013-02-17 – 2013-02-19 (×2)

## 2013-02-12 MED ORDER — WARFARIN SODIUM 5 MG PO TABS
5.0000 mg | ORAL_TABLET | Freq: Once | ORAL | Status: AC
  Administered 2013-02-12: 5 mg via ORAL
  Filled 2013-02-12: qty 1

## 2013-02-12 NOTE — Progress Notes (Signed)
[facility] PROGRESS NOTE  [name] [name] YYT:[id number] DOB: April 29, 1956 DOA: 02/03/2013 PCP: Garnet Koyanagi, DO  Assessment/Plan: Active Problems:   Type II or unspecified type diabetes mellitus with unspecified complication, uncontrolled   HYPERLIPIDEMIA   ANXIETY   HYPERTENSION   Cirrhosis of liver without mention of alcohol   Cellulitis and abscess of foot   Diabetic foot ulcer associated with type 2 diabetes mellitus   Hyponatremia   Hypokalemia   Anemia   Dehydration   Diabetic foot ulcer on right lower extremity with cellulitis of the foot  -continue doxycycline to complete a total of 4 weeks of antibiotics. Wound cx demonstrated cocci in pairs, speciation pending. 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 Doing ok and stable. (follow up with Dr. Sharol Given in his office in 1 week, no weight bearing on right foot for 1-2 weeks) -ABI results are normal (probably will be ok for debridement and minimal amputation) -CRP and ESR elevated (14 and 102 respectively); ESR also improving and trending down (86 on 4/14) -continue PRN analgesics -WBC's WNL now and patient afebrile.  Uncontrolled type 2 diabetes  -Resistant sliding scale. Continue home Lantus. Hemoglobin A1c 12.1 (down from 14) sugesting blood sugars have been uncontrolled at home; but slightly better.  -CBG's now within 120-150 range. -continue SSI and lantus  Hypokalemia and hyponatremia  Likely due to uncontrolled diabetes.  Replace potassium as needed. Suspect hyponatremia is likely due to hyperosmolar hyperglycemic nonketotic state from uncontrolled diabetes. Now improving, Continue fluid resuscitation.  -Sodium and potassium WNL -continue monitoring and replete as needed  Dehydration  Improved after IVF's. Good urine output Change IVF to NSL -patient with good PO intake  Anemia  Likely due to chronic disease. Anemia panel corroborates diagnosis of AOCD.   -Will monitor Hgb especially after surgery; CBC in am  Hypertension  Stable. Continue low sodium diet.  Neuropathy: -continue gabapentin -continue lyrica.  Hyperlipidemia  Stable. Cont statins  Anxiety/depression: Will continue buspar and cymbalta.  Prophylaxis  Lovenox.   Code Status: full Family Communication:No family at bedside Disposition Plan:  To be detrmine after PT/OT evaluate on patient; no good support at home according to her; 2 story house and can not bear weight on her right foot for 1-2 week as recommended by orthopedic service. CIR vs SNF   Consultants:  Orthopedics  Procedures:  See below for x-ray results  Antibiotics:  Vancomycin 02/03/13--02/11/13  primaxin 02/03/13--02/11/13  Doxycycline 02/12/13 (plan is for a total of 3-4 weeks)  HPI/Subjective: Reports pain on her foot; no fever, no overnight complaints. 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 Poor support/assistance at home at this moment.  Objective: Filed Vitals:   02/12/13 0513 02/12/13 0910 02/12/13 1355 02/12/13 1800  BP: 122/68 147/65 96/54 128/66  Pulse: 83 92 89 98  Temp: 98.2 F (36.8 C) 97.9 F (36.6 C) 99 F (37.2 C) 97.7 F (36.5 C)  TempSrc: Oral Oral Oral Oral  Resp: 17 18 16 18   Height:      Weight: 73.6 kg (162 lb 4.1 oz)     SpO2: 96% 97% 100% 95%    Intake/Output Summary (Last 24 hours) at 02/12/13 2222 Last data filed at 02/12/13 1700  Gross per 24 hour  Intake    240 ml  Output    401 ml  Net   -161 ml    Exam: GEN: AAOX3, no  fever, complaining of mild foot pain Cardio: regular rate and rhythm, S1, S2 normal, no murmur, rubs or gallops Lungs: CTA bilaterally GI: soft, non-tender; bowel sounds normal; no masses, no organomegaly  Extremities: RLE with Good pulses; Trace edema. Clean gauzes and dressing after fifth ray amputation and surgical debridement on 4/15  Data  Reviewed: Basic Metabolic Panel:  Recent Labs Lab 02/06/13 0520 02/10/13 0730  NA 132* 139  K 4.3 3.9  CL 96 103  CO2 30 31  GLUCOSE 222* 151*  BUN 11 11  CREATININE 0.81 0.77  CALCIUM 8.6 8.8    CBC:  Recent Labs Lab 02/06/13 0520 02/09/13 0856 02/10/13 0730  WBC 12.3* 8.8 8.9  HGB 11.0* 11.0* 10.9*  HCT 32.7* 33.0* 32.2*  MCV 78.4 79.1 78.5  PLT 264 263 243    CBG:  Recent Labs Lab 02/11/13 2229 02/12/13 0739 02/12/13 1148 02/12/13 1636 02/12/13 2210  GLUCAP 201* 197* 169* 221* 159*    Studies: Dg Chest 2 View  02/03/2013  *RADIOLOGY REPORT*  Clinical Data: Fall with left-sided rib pain.  CHEST - 2 VIEW  Comparison: 10/15/2012  Findings: Chest x-ray shows mild bibasilar atelectasis.  No pneumothorax, focal consolidation or pleural fluid is identified. The heart size is normal.  Visualized bony structures show no obvious acute rib fractures.  There is chronic deformity of the proximal left humerus related to prior fracture.  IMPRESSION: Mild bibasilar atelectasis.   Original Report Authenticated By: Aletta Edouard, M.D.    Dg Hip Complete Right  02/03/2013  *RADIOLOGY REPORT*  Clinical Data: Fall with right hip pain.  RIGHT HIP - COMPLETE 2+ VIEW  Comparison:  None.  Findings:  There is no evidence of hip fracture or dislocation. There is no evidence of arthropathy or other focal bone abnormality. The bony pelvis is intact.  IMPRESSION: Negative.   Original Report Authenticated By: Aletta Edouard, M.D.    Ct Abdomen Pelvis W Contrast  02/03/2013  *RADIOLOGY REPORT*  Clinical Data: Fall 5 days ago.  Right lower quadrant pain. Nausea.  History of "liver cancer."  Diabetes.  Hypertension.  CT ABDOMEN AND PELVIS WITH CONTRAST  Technique:  Multidetector CT imaging of the abdomen and pelvis was performed following the standard protocol during bolus administration of intravenous contrast.  Contrast: 1 OMNIPAQUE IOHEXOL 300 MG/ML  SOLN, 129m OMNIPAQUE IOHEXOL 300 MG/ML  SOLN   Comparison: 07/06/2010  Findings: Lung bases:  Clear lung bases.  Mild cardiomegaly.  A tiny hiatal hernia. Prominent right cardiophrenic angle node on image 10/series 2 is chronic and reactive.  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  Prominent left portal vein on image 18/series 2, similar to 06/04/2007.  Persistent splenomegaly, with the spleen measuring 13.7 cm cranial caudal.  Normal stomach, pancreas. Cholecystectomy without biliary ductal dilatation.  Normal adrenal glands.  Bilateral heterogeneous renal enhancement, especially on the delayed series seven.  Mildly prominent retroperitoneal nodes which are likely reactive and are unchanged.  Colonic stool burden suggests constipation.  Normal terminal ileum and appendix.  No pneumatosis or free intraperitoneal air.  Normal small bowel without abdominal ascites.  1.3 cm right external iliac node on image 84/series 2 is new since 07/06/2010.  There are also prominent right inguinal nodes which are newly enlarged.  Normal urinary bladder.  Hysterectomy. No significant free fluid.  Bones/Musculoskeletal:  No acute osseous abnormality.  IMPRESSION:  1.  No acute post-traumatic deformity identified. Heterogeneous renal enhancement is suspicious for bilateral  pyelonephritis. 2.  Moderate cirrhosis with persistent splenomegaly. 3. Possible constipation. 4.  Normal appendix. 5.  Mild adenopathy in the right external iliac station with prominent right inguinal nodes.  Favored to be reactive.  Not in the typical drainage pattern for liver cancer metastasis.  Consider physical exam correlation.   Original Report Authenticated By: Abigail Miyamoto, M.D.    Dg Foot Complete Right  02/03/2013  *RADIOLOGY REPORT*  Clinical Data: Diabetic foot.  Redness and swelling.  Lateral sore on bottom of foot at  head of first metatarsal.  RIGHT FOOT COMPLETE - 3+ VIEW  Comparison: None.  Findings: Minimal hallux valgus  deformity.  There may be soft tissue swelling about the medial aspect of the first metatarsal phalangeal joint.  No osseous destruction.  Achilles and calcaneal spurs.  Probable dorsal forefoot soft tissue swelling as well.  No soft tissue gas or radiopaque foreign object.  Possible skin/soft tissue ulcer about the fifth metatarsal phalangeal joint, most apparent on the oblique view.  IMPRESSION: No acute osseous abnormality.  Possible soft tissue defect about the lateral aspect of the fifth MTP joint.  Relatively diffuse soft tissue swelling about the forefoot.   Original Report Authenticated By: Abigail Miyamoto, M.D.     Scheduled Meds: . atorvastatin  10 mg Oral q1800  . busPIRone  5 mg Oral TID  . calcium-vitamin D  1 tablet Oral Daily  . cholecalciferol  2,000 Units Oral Daily  . doxycycline  100 mg Oral Q12H  . DULoxetine  60 mg Oral Daily  . enoxaparin (LOVENOX) injection  40 mg Subcutaneous Q24H  . escitalopram  10 mg Oral Daily  . gabapentin  300 mg Oral TID  . insulin aspart  0-20 Units Subcutaneous TID WC  . insulin aspart  0-5 Units Subcutaneous QHS  . insulin aspart  10 Units Subcutaneous TID WC  . insulin glargine  45 Units Subcutaneous QHS  . metaxalone  800 mg Oral TID  . multivitamin with minerals  1 tablet Oral Daily  . polyethylene glycol  17 g Oral Daily  . pregabalin  50 mg Oral BID  . vitamin C  500 mg Oral Daily  . Warfarin - Pharmacist Dosing Inpatient   Does not apply q1800   Continuous Infusions:    Active Problems:   Type II or unspecified type diabetes mellitus with unspecified complication, uncontrolled   HYPERLIPIDEMIA   ANXIETY   HYPERTENSION   Cirrhosis of liver without mention of alcohol   Cellulitis and abscess of foot   Diabetic foot ulcer associated with type 2 diabetes mellitus   Hyponatremia   Hypokalemia   Anemia   Dehydration  Time spent: < 30 minutes   Olamae Ferrara  [facility] Pager [phone]. If 8PM-8AM, please contact  night-coverage at [email or web address], password [facility] 02/12/2013, 10:22 PM  LOS: 9 days

## 2013-02-12 NOTE — Clinical Social Work Psychosocial (Signed)
     Clinical Social Work Department BRIEF PSYCHOSOCIAL ASSESSMENT 02/12/2013  Patient:  [name] [name], [name] [name]     Account Number:  [id number]     Admit date:  02/03/2013  Clinical Social Worker:  Otilio Saber  Date/Time:  02/12/2013 04:14 PM  Referred by:  RN  Date Referred:  02/12/2013 Referred for  SNF Placement   Other Referral:   Interview type:  Patient Other interview type:    PSYCHOSOCIAL DATA Living Status:  HUSBAND Admitted from facility:   Level of care:   Primary support name:  Dwana Curd Primary support relationship to patient:  SPOUSE Degree of support available:   supportive    CURRENT CONCERNS Current Concerns  Post-Acute Placement   Other Concerns:    SOCIAL WORK ASSESSMENT / PLAN Clinical Social Worker received referral for discussion of rehab, SNF placement for patient vs CIR placement. CSW introduced self and explained reason for visit.    CSW spoke with patient and explained the SNF process to patient and the importance of doing a SNF search as well as CIR consult. CSW explained that if insurance does not approve CIR, then SNF will be the next level of care and patient voiced understanding. Patient was agreeable for CSW to initiate SNF search in [place]. focusing on [facility] area, excluding [facility] and both [facility]. Patient did express interest in [place] and [place] SNFs. CSW will initiate SNF search as a back up to CIR pending insurance approval.    CSW will complete FL2 for MD's signature and update patient when bed offers are made.   Assessment/plan status:  Psychosocial Support/Ongoing Assessment of Needs Other assessment/ plan:   Information/referral to community resources:   SNF packet    PATIENTS/FAMILYS RESPONSE TO PLAN OF CARE: Patient was agreeable for SNF search in [facility]. as a back up to [facility] pending insurance approval. Patient was appreciative of CSW's visit and assistance.

## 2013-02-12 NOTE — Progress Notes (Signed)
[place] for coumadin Indication: VTE prophylaxis  Allergies  Allergen Reactions  . Fish-Derived Products Hives, Shortness Of Breath, Swelling and Rash    Hives get in throat causing trouble breathing  . Mushroom Extract Complex Anaphylaxis  . Penicillins Anaphylaxis  . Tomato Hives and Shortness Of Breath    Hives in throat causes her trouble breathing   Labs:  Recent Labs  02/10/13 0730 02/11/13 0620 02/12/13 0632  HGB 10.9*  --   --   HCT 32.2*  --   --   PLT 243  --   --   LABPROT  --  14.4 14.9  INR  --  1.14 1.19  CREATININE 0.77  --   --     Estimated Creatinine Clearance: 69.2 ml/min (by C-G formula based on Cr of 0.77).   Assessment: 57 yo female s/p partial right foot amputation and debridement. Patient to begin coumadin for VTE prophylaxis. Patient noted on lovenox 86m sq q24hr.  No baseline INR. INR today = 1.19   Goal of Therapy:  INR 2-3 Monitor platelets by anticoagulation protocol: Yes   Plan: 1) Coumadin 5 mg po x 1 dose tonight 2) Daily INR  Thank you. LAnette Guarneri PharmD [phone]  02/12/2013 1:05 PM

## 2013-02-12 NOTE — Progress Notes (Signed)
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 NWB on RLE. Ambulating further today however limited fatigue and weakness of upper extremities being able to clear her foot enough when hopping.     Follow Up Recommendations  CIR     Does the patient have the potential to tolerate intense rehabilitation     Barriers to Discharge        Equipment Recommendations  Rolling walker with 5" wheels    Recommendations for Other Services    Frequency Min 3X/week   Plan Discharge plan remains appropriate;Frequency remains appropriate    Precautions / Restrictions Precautions Precautions: Fall Restrictions Weight Bearing Restrictions: Yes RLE Weight Bearing: Non weight bearing Other Position/Activity Restrictions: per Dr. Jess Barters note   Pertinent Vitals/Pain Reports 9/10 pain, RN aware    Mobility  Bed Mobility Bed Mobility: Supine to Sit Supine to Sit: 6: Modified independent (Device/Increase time) Sitting - Scoot to Edge of Bed: 6: Modified independent (Device/Increase time) Sit to Supine: 6: Modified independent (Device/Increase time) Transfers Transfers: Sit to Stand;Stand to Sit Sit to Stand: 4: Min assist;With upper extremity assist;From bed;From toilet Stand to Sit: 4: Min assist;With upper extremity assist;To toilet;To bed Details for Transfer Assistance: cues for safe hand placement and stability assist assist as she rises to stand and steadies herself on one leg, several min LOB laterally needing minA to steady Ambulation/Gait Ambulation/Gait Assistance: 4: Min assist Ambulation Distance (Feet): 20 Feet Assistive device: Rolling walker Ambulation/Gait Assistance Details: cues for sequencing hop-to gait using RW as well as clearing foot  enough that it doesn't drag on the floor; fatigues quickly and gets a bit impulsive when she is tired, cues to slow down and take rest breaks to recover Gait Pattern:  (hop-to pattern) Gait velocity: decreased General Gait Details: hop-to pattern      PT Goals Acute Rehab PT Goals PT Goal: Supine/Side to Sit - Progress: Progressing toward goal PT Goal: Sit to Supine/Side - Progress: Progressing toward goal PT Goal: Sit to Stand - Progress: Progressing toward goal PT Goal: Stand to Sit - Progress: Progressing toward goal PT Transfer Goal: Bed to Chair/Chair to Bed - Progress: Progressing toward goal PT Goal: Ambulate - Progress: Progressing toward goal  Visit Information  Last PT Received On: 02/12/13 Assistance Needed: +1    Subjective Data  Subjective: I tripped over the walker yesterday.  Patient Stated Goal: home   Cognition  Cognition Arousal/Alertness: Awake/alert Behavior During Therapy: WFL for tasks assessed/performed Overall Cognitive Status: Within Functional Limits for tasks assessed General Comments: anxious about mobility and pain    Balance  Balance Balance Assessed: No  End of Session PT - End of Session Equipment Utilized During Treatment: Gait belt Activity Tolerance: Patient tolerated treatment well;Patient limited by fatigue Patient left: in bed;with call bell/phone within reach Nurse Communication: Mobility status   GP     [place] 02/12/2013, 5:29 PM

## 2013-02-12 NOTE — Evaluation (Signed)
Occupational Therapy Evaluation Patient Details Name: [name] [name] MRN: [id number] DOB: 23-Nov-1955 Today's Date: 02/12/2013 Time: 2119-4174 OT Time Calculation (min): 25 min  OT Assessment / Plan / Recommendation Clinical Impression  Pt demos decline in function with ADLs and ADL mobility safety following 5th Ray amputation of R LE, pt is NWB. Pt would benefit from OT services to address impairments in strength, balance, activity tolerance and safety to help retsore PLOF    OT Assessment  Patient needs continued OT Services    Follow Up Recommendations  CIR    Barriers to Discharge Decreased caregiver support Husband works during the day, does overtime  Equipment Recommendations       Recommendations for Other Services Rehab consult  Frequency  Min 2X/week    Precautions / Restrictions Restrictions Weight Bearing Restrictions: Yes RLE Weight Bearing: Non weight bearing Other Position/Activity Restrictions: per Dr. Jess Barters note   Pertinent Vitals/Pain 9/10 R foot    ADL  Grooming: Wash/dry hands;Wash/dry face;Supervision/safety;Set up Where Assessed - Grooming: Unsupported sitting Upper Body Bathing: Simulated;Supervision/safety;Set up Lower Body Bathing: Simulated;Moderate assistance Upper Body Dressing: Performed;Supervision/safety;Set up Lower Body Dressing: Performed;Moderate assistance;Maximal assistance Toilet Transfer: Performed;Minimal assistance Toilet Transfer Method: Sit to stand;Stand pivot Toilet Transfer Equipment: Bedside commode Toileting - Clothing Manipulation and Hygiene: Performed;Min guard Where Assessed - Camera operator Manipulation and Hygiene: Standing Equipment Used: Rolling walker;Long-handled shoe horn;Long-handled sponge;Reacher;Gait belt (BSC) ADL Comments: Pt educated on ADL A/E for use at home    OT Diagnosis: Generalized weakness;Acute pain  OT Problem List: Decreased strength;Decreased knowledge of use of DME or AE;Impaired balance  (sitting and/or standing);Pain;Decreased activity tolerance OT Treatment Interventions: Self-care/ADL training;Balance training;Therapeutic exercise;Neuromuscular education;Therapeutic activities;DME and/or AE instruction;Patient/family education   OT Goals Acute Rehab OT Goals OT Goal Formulation: With patient Time For Goal Achievement: 02/19/13 Potential to Achieve Goals: Good ADL Goals Pt Will Perform Grooming: with min assist;Standing at sink;Supported ADL Goal: Grooming - Progress: Goal set today Pt Will Perform Lower Body Bathing: with min assist;with adaptive equipment ADL Goal: Lower Body Bathing - Progress: Goal set today Pt Will Perform Lower Body Dressing: with min assist;with mod assist;with adaptive equipment ADL Goal: Lower Body Dressing - Progress: Goal set today Pt Will Transfer to Toilet: with supervision;with DME;Grab bars ADL Goal: Toilet Transfer - Progress: Goal set today Pt Will Perform Toileting - Clothing Manipulation: with supervision;Standing ADL Goal: Toileting - Clothing Manipulation - Progress: Goal set today Pt Will Perform Toileting - Hygiene: with supervision;Sitting on 3-in-1 or toilet ADL Goal: Toileting - Hygiene - Progress: Goal set today Pt Will Perform Tub/Shower Transfer: with supervision;with DME;Shower transfer ADL Goal: Clinical cytogeneticist - Progress: Goal set today  Visit Information  Last OT Received On: 02/12/13    Subjective Data  Subjective: " I can't put any weight on my leg " Patient Stated Goal: To return home   Prior [place] Lives With: Spouse Available Help at Discharge: Family Type of Home: House Home Access: Stairs to enter Technical brewer of Steps: 2 Entrance Stairs-Rails: None Home Layout: Two level Alternate Level Stairs-Number of Steps: 12 Alternate Level Stairs-Rails: Right Bathroom Shower/Tub: Multimedia programmer: Standard Home Adaptive Equipment: None Prior  Function Level of Independence: Independent Able to Take Stairs?: Reciprically Driving: Yes Vocation: Self employed Communication Communication: No difficulties Dominant Hand: Left         Vision/Perception Vision - History Baseline Vision: Wears glasses only for reading Patient Visual Report: No change from baseline Perception Perception:  Within Functional Limits   Cognition  Cognition Arousal/Alertness: Awake/alert Overall Cognitive Status: Within Functional Limits for tasks assessed General Comments: anxious about mobility and pain    Extremity/Trunk Assessment Right Upper Extremity Assessment RUE ROM/Strength/Tone: WFL for tasks assessed RUE Coordination: WFL - gross/fine motor Left Upper Extremity Assessment LUE ROM/Strength/Tone: WFL for tasks assessed LUE Coordination: WFL - gross/fine motor     Mobility Bed Mobility Bed Mobility: Supine to Sit Supine to Sit: 5: Supervision;With rails;HOB elevated Sitting - Scoot to Edge of Bed: 5: Supervision Transfers Transfers: Sit to Stand;Stand to Sit Sit to Stand: 4: Min assist;With upper extremity assist;From bed Stand to Sit: 4: Min assist;With upper extremity assist;3: Mod assist;To chair/3-in-1     Exercise     Balance Balance Balance Assessed: No   End of Session OT - End of Session Equipment Utilized During Treatment: Gait belt;Other (comment) (RW, ADL A/E, BSC) Activity Tolerance: Patient tolerated treatment well Patient left: in bed;with call bell/phone within reach  GO     [place] 02/12/2013, 4:10 PM

## 2013-02-12 NOTE — Progress Notes (Signed)
[name], female   DOB: 03-05-1956, 57 y.o.   MRN: [id number] Cultures positive for gram-positive cocci in pairs. I anticipate the doxycycline should be an excellent oral medication for patient for one month postoperatively. Continue nonweightbearing right lower extremity for 2 weeks. I agree the patient will need either need inpatient rehabilitation or short-term skilled nursing.
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
## 2013-02-12 NOTE — Plan of Care (Signed)
Problem: Phase II Progression Outcomes Goal: Discharge plan established Recommend CIR consult for further therapy needs after acute care d/c

## 2013-02-13 ENCOUNTER — Encounter (HOSPITAL_COMMUNITY): Payer: Self-pay | Admitting: Physical Medicine and Rehabilitation

## 2013-02-13 LAB — CBC
MCH: 25.8 pg — ABNORMAL LOW (ref 26.0–34.0)
MCHC: 32.8 g/dL (ref 30.0–36.0)
RDW: 13.6 % (ref 11.5–15.5)

## 2013-02-13 LAB — GLUCOSE, CAPILLARY
Glucose-Capillary: 142 mg/dL — ABNORMAL HIGH (ref 70–99)
Glucose-Capillary: 175 mg/dL — ABNORMAL HIGH (ref 70–99)

## 2013-02-13 LAB — BASIC METABOLIC PANEL
Calcium: 9 mg/dL (ref 8.4–10.5)
GFR calc Af Amer: 86 mL/min — ABNORMAL LOW (ref >90)
GFR calc non Af Amer: 75 mL/min — ABNORMAL LOW (ref >90)
Potassium: 3.8 mEq/L (ref 3.5–5.1)
Sodium: 139 mEq/L (ref 135–145)

## 2013-02-13 LAB — PROTIME-INR
INR: 1.25 (ref 0.00–1.49)
Prothrombin Time: 15.5 seconds — ABNORMAL HIGH (ref 11.6–15.2)

## 2013-02-13 MED ORDER — HYDROMORPHONE HCL PF 1 MG/ML IJ SOLN
1.0000 mg | INTRAMUSCULAR | Status: DC | PRN
  Administered 2013-02-13 – 2013-02-14 (×3): 1 mg via INTRAVENOUS
  Filled 2013-02-13 (×4): qty 1

## 2013-02-13 MED ORDER — HYDROCODONE-ACETAMINOPHEN 5-325 MG PO TABS
2.0000 | ORAL_TABLET | ORAL | Status: DC | PRN
  Administered 2013-02-13 – 2013-02-20 (×33): 2 via ORAL
  Filled 2013-02-13 (×33): qty 2

## 2013-02-13 MED ORDER — WARFARIN SODIUM 5 MG PO TABS
5.0000 mg | ORAL_TABLET | Freq: Once | ORAL | Status: AC
  Administered 2013-02-13: 5 mg via ORAL
  Filled 2013-02-13: qty 1

## 2013-02-13 NOTE — Progress Notes (Signed)
Occupational Therapy Treatment Patient Details Name: [name] [name] MRN: [id number] DOB: 1956/05/21 Today's Date: 02/13/2013 Time: 9518-8416 OT Time Calculation (min): 19 min  OT Assessment / Plan / Recommendation Comments on Treatment Session      Follow Up Recommendations  CIR    Barriers to Discharge       Equipment Recommendations  3 in 1 bedside comode    Recommendations for Other Services Rehab consult  Frequency Min 2X/week   Plan Discharge plan remains appropriate    Precautions / Restrictions Precautions Precautions: Fall Restrictions Weight Bearing Restrictions: Yes RLE Weight Bearing: Non weight bearing   Pertinent Vitals/Pain See vitals    ADL  Grooming: Performed;Wash/dry hands;Min guard Where Assessed - Grooming: Supported Personnel officer: Performed;Minimal Print production planner Method: Sit to Loss adjuster, chartered: Therapist, occupational and Hygiene: Performed;Min guard Where Assessed - Best boy and Hygiene: Sit to stand from 3-in-1 or toilet Equipment Used: Rolling walker;Gait belt Transfers/Ambulation Related to ADLs: min guard-min assist with ambulation in room.  Min assist needed for steadying when experiencing cramping in RLE. ADL Comments: Incr time due to pain.    OT Diagnosis:    OT Problem List:   OT Treatment Interventions:     OT Goals ADL Goals Pt Will Perform Grooming: with min assist;Standing at sink;Supported ADL Goal: Grooming - Progress: Met Pt Will Transfer to Toilet: with supervision;with DME;Grab bars ADL Goal: Armed forces technical officer - Progress: Progressing toward goals Pt Will Perform Toileting - Clothing Manipulation: with supervision;Standing ADL Goal: Toileting - Clothing Manipulation - Progress: Progressing toward goals Pt Will Perform Toileting - Hygiene: with supervision;Sitting on 3-in-1 or toilet ADL Goal: Toileting - Hygiene - Progress: Progressing  toward goals  Visit Information  Last OT Received On: 02/13/13 Assistance Needed: +1    Subjective Data      Prior Functioning       Cognition  Cognition Arousal/Alertness: Awake/alert Behavior During Therapy: WFL for tasks assessed/performed Overall Cognitive Status: Within Functional Limits for tasks assessed    Mobility  Bed Mobility Bed Mobility: Supine to Sit Supine to Sit: 6: Modified independent (Device/Increase time) Sitting - Scoot to Edge of Bed: 6: Modified independent (Device/Increase time) Sit to Supine: 6: Modified independent (Device/Increase time) Transfers Transfers: Sit to Stand;Stand to Sit Sit to Stand: 4: Min assist;With upper extremity assist;From bed;From chair/3-in-1 Stand to Sit: With upper extremity assist;To chair/3-in-1;4: Min guard Details for Transfer Assistance: Cues for safe hand placement.     Exercises      Balance     End of Session OT - End of Session Equipment Utilized During Treatment: Gait belt Activity Tolerance: Patient tolerated treatment well Patient left: in chair;with call bell/phone within reach  GO   02/13/2013 Darrol Jump OTR/L Pager [phone] Office [phone]   Darrol Jump 02/13/2013, 2:52 PM

## 2013-02-13 NOTE — Progress Notes (Signed)
Rehab admissions - Evaluated for possible admission.  I spoke with patient.  She would like to come to inpatient rehab prior to home.  I have called and faxed information to [facility] requesting inpatient rehab admission.  Will await insurance response.  Call me for questions.  #[phone]

## 2013-02-13 NOTE — Progress Notes (Signed)
[facility] PROGRESS NOTE  [name] [name] BWL:[id number] DOB: [birth year] DOA: 02/03/2013 PCP: Garnet Koyanagi, DO  Assessment/Plan: Active Problems:   Type II or unspecified type diabetes mellitus with unspecified complication, uncontrolled   HYPERLIPIDEMIA   ANXIETY   HYPERTENSION   Cirrhosis of liver without mention of alcohol   Cellulitis and abscess of foot   Diabetic foot ulcer associated with type 2 diabetes mellitus   Hyponatremia   Hypokalemia   Anemia   Dehydration   Diabetic foot ulcer on right lower extremity with cellulitis of the foot s/p R foot debridement and Abx bead placement + 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 Doing ok and stable.  -continue Vicodin-Increased to 2 tabs every 4 hours prn.  Spaced out Dilaudid 31m to q4 prm IV -WBC's WNL now and patient afebrile.  Uncontrolled type 2 diabetes  -Resistant sliding scale. Continue home Lantus. Hemoglobin A1c 12.1 (down from 14) sugesting blood sugars have been uncontrolled at home; but slightly better.  -CBG's now within 120-170 range. -continue SSI and lantus 45 units  Hypokalemia and hyponatremia  -Likely due to uncontrolled diabetes.  -Replace potassium as needed.  Now improving, Continue fluid resuscitation.  -Sodium and potassium WNL -continue monitoring and replete as needed  Dehydration 2/2 to hyperosmolar nont-ketotic state -Improved after IVF's. -Good urine output -patient with good PO intake  Anemia  Likely due to chronic disease. Anemia panel corroborates diagnosis of AOCD.  -Will monitor Hgb especially after surgery -hemoglobin down to 9 from 11 pre-op  Hypertension  Stable. Continue low sodium diet.  Neuropathy: -continue gabapentin 300 tid -continue lyrica 50 bid [may need to address as  outpatient need for both?  Hyperlipidemia  Stable. Cont Atorvastatin 10 daily  Anxiety/depression: Will continue buspa 5 tid/cymbalta 60/ Lexapro 10 daily  Prophylaxis  Lovenox.   Code Status: full Family Communication:No family at bedside Disposition Plan:  To be detrmine after PT/OT evaluate on patient; no good support at home according to her; 2 story house and can not bear weight on her right foot for 1-2 week as recommended by orthopedic service. CIR vs SNF   Consultants:  Orthopedics  Procedures:  See below for x-ray results  Antibiotics:  Vancomycin 02/03/13--02/11/13  primaxin 02/03/13--02/11/13  Doxycycline 02/12/13 (plan is for a total of 3-4 weeks)  HPI/Subjective: Reports pain on her foot; no fever, no overnight complaints States is about 8/10.   Objective: Filed Vitals:   02/12/13 1355 02/12/13 1800 02/12/13 2135 02/13/13 0513  BP: 96/54 128/66 135/65 121/78  Pulse: 89 98 87 91  Temp: 99 F (37.2 C) 97.7 F (36.5 C) 98.1 F (36.7 C) 97.9 F (36.6 C)  TempSrc: Oral Oral Oral Oral  Resp: 16 18 18 16   Height:      Weight:      SpO2: 100% 95% 94% 95%    Intake/Output Summary (Last 24 hours) at 02/13/13 1201 Last data filed at 02/13/13 0513  Gross per 24 hour  Intake    240 ml  Output      0 ml  Net    240 ml    Exam: GEN: AAOX3, no fever, complaining of mild foot pain Cardio: regular rate and rhythm, S1, S2 normal, no murmur, rubs or gallops Lungs: CTA bilaterally GI: soft, non-tender; bowel sounds normal; no masses, no organomegaly  Extremities:  RLE with Good pulses; Trace edema. Clean gauzes and dressing after fifth ray amputation and surgical debridement on 4/15  Data Reviewed: Basic Metabolic Panel:  Recent Labs Lab 02/10/13 0730 02/13/13 0615  NA 139 139  K 3.9 3.8  CL 103 103  CO2 31 32  GLUCOSE 151* 170*  BUN 11 10  CREATININE 0.77 0.85  CALCIUM 8.8 9.0    CBC:  Recent Labs Lab 02/09/13 0856 02/10/13 0730 02/13/13 0615   WBC 8.8 8.9 9.4  HGB 11.0* 10.9* 9.6*  HCT 33.0* 32.2* 29.3*  MCV 79.1 78.5 78.8  PLT 263 243 263    CBG:  Recent Labs Lab 02/12/13 0739 02/12/13 1148 02/12/13 1636 02/12/13 2210 02/13/13 0800  GLUCAP 197* 169* 221* 159* 127*    Studies: Dg Chest 2 View  02/03/2013  *RADIOLOGY REPORT*  Clinical Data: Fall with left-sided rib pain.  CHEST - 2 VIEW  Comparison: 10/15/2012  Findings: Chest x-ray shows mild bibasilar atelectasis.  No pneumothorax, focal consolidation or pleural fluid is identified. The heart size is normal.  Visualized bony structures show no obvious acute rib fractures.  There is chronic deformity of the proximal left humerus related to prior fracture.  IMPRESSION: Mild bibasilar atelectasis.   Original Report Authenticated By: Aletta Edouard, M.D.    Dg Hip Complete Right  02/03/2013  *RADIOLOGY REPORT*  Clinical Data: Fall with right hip pain.  RIGHT HIP - COMPLETE 2+ VIEW  Comparison:  None.  Findings:  There is no evidence of hip fracture or dislocation. There is no evidence of arthropathy or other focal bone abnormality. The bony pelvis is intact.  IMPRESSION: Negative.   Original Report Authenticated By: Aletta Edouard, M.D.    Ct Abdomen Pelvis W Contrast  02/03/2013  *RADIOLOGY REPORT*  Clinical Data: Fall 5 days ago.  Right lower quadrant pain. Nausea.  History of "liver cancer."  Diabetes.  Hypertension.  CT ABDOMEN AND PELVIS WITH CONTRAST  Technique:  Multidetector CT imaging of the abdomen and pelvis was performed following the standard protocol during bolus administration of intravenous contrast.  Contrast: 1 OMNIPAQUE IOHEXOL 300 MG/ML  SOLN, 135m OMNIPAQUE IOHEXOL 300 MG/ML  SOLN  Comparison: 07/06/2010  Findings: Lung bases:  Clear lung bases.  Mild cardiomegaly.  A tiny hiatal hernia. Prominent right cardiophrenic angle node on image 10/series 2 is chronic and reactive.  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 Old granulomatous  disease of the liver.  Prominent left portal vein on image 18/series 2, similar to 06/04/2007.  Persistent splenomegaly, with the spleen measuring 13.7 cm cranial caudal.  Normal stomach, pancreas. Cholecystectomy without biliary ductal dilatation.  Normal adrenal glands.  Bilateral heterogeneous renal enhancement, especially on the delayed series seven.  Mildly prominent retroperitoneal nodes which are likely reactive and are unchanged.  Colonic stool burden suggests constipation.  Normal terminal ileum and appendix.  No pneumatosis or free intraperitoneal air.  Normal small bowel without abdominal ascites.  1.3 cm right external iliac node on image 84/series 2 is new since 07/06/2010.  There are also prominent right inguinal nodes which are newly enlarged.  Normal urinary bladder.  Hysterectomy. No significant free fluid.  Bones/Musculoskeletal:  No acute osseous abnormality.  IMPRESSION:  1.  No acute post-traumatic deformity identified. Heterogeneous renal enhancement is suspicious for bilateral pyelonephritis. 2.  Moderate cirrhosis with persistent splenomegaly. 3. Possible constipation. 4.  Normal appendix. 5.  Mild adenopathy in the right external iliac station with prominent right inguinal nodes.  Favored to be  reactive.  Not in the typical drainage pattern for liver cancer metastasis.  Consider physical exam correlation.   Original Report Authenticated By: Abigail Miyamoto, M.D.    Dg Foot Complete Right  02/03/2013  *RADIOLOGY REPORT*  Clinical Data: Diabetic foot.  Redness and swelling.  Lateral sore on bottom of foot at  head of first metatarsal.  RIGHT FOOT COMPLETE - 3+ VIEW  Comparison: None.  Findings: Minimal hallux valgus deformity.  There may be soft tissue swelling about the medial aspect of the first metatarsal phalangeal joint.  No osseous destruction.  Achilles and calcaneal spurs.  Probable dorsal forefoot soft tissue swelling as well.  No soft tissue gas or radiopaque foreign object.  Possible  skin/soft tissue ulcer about the fifth metatarsal phalangeal joint, most apparent on the oblique view.  IMPRESSION: No acute osseous abnormality.  Possible soft tissue defect about the lateral aspect of the fifth MTP joint.  Relatively diffuse soft tissue swelling about the forefoot.   Original Report Authenticated By: Abigail Miyamoto, M.D.     Scheduled Meds: . atorvastatin  10 mg Oral q1800  . busPIRone  5 mg Oral TID  . calcium-vitamin D  1 tablet Oral Daily  . cholecalciferol  2,000 Units Oral Daily  . doxycycline  100 mg Oral Q12H  . DULoxetine  60 mg Oral Daily  . enoxaparin (LOVENOX) injection  40 mg Subcutaneous Q24H  . escitalopram  10 mg Oral Daily  . gabapentin  300 mg Oral TID  . insulin aspart  0-20 Units Subcutaneous TID WC  . insulin aspart  0-5 Units Subcutaneous QHS  . insulin aspart  10 Units Subcutaneous TID WC  . insulin glargine  45 Units Subcutaneous QHS  . metaxalone  800 mg Oral TID  . multivitamin with minerals  1 tablet Oral Daily  . polyethylene glycol  17 g Oral Daily  . pregabalin  50 mg Oral BID  . vitamin C  500 mg Oral Daily  . Warfarin - Pharmacist Dosing Inpatient   Does not apply q1800   Continuous Infusions:    Active Problems:   Type II or unspecified type diabetes mellitus with unspecified complication, uncontrolled   HYPERLIPIDEMIA   ANXIETY   HYPERTENSION   Cirrhosis of liver without mention of alcohol   Cellulitis and abscess of foot   Diabetic foot ulcer associated with type 2 diabetes mellitus   Hyponatremia   Hypokalemia   Anemia   Dehydration  Time spent: < 30 minutes   Nita Sells  [facility] Pager [phone]. If 8PM-8AM, please contact night-coverage at [email or web address], password [facility] 02/13/2013, 12:01 PM  LOS: 10 days

## 2013-02-13 NOTE — Progress Notes (Signed)
[place] for coumadin Indication: VTE prophylaxis  Allergies  Allergen Reactions  . Fish-Derived Products Hives, Shortness Of Breath, Swelling and Rash    Hives get in throat causing trouble breathing  . Mushroom Extract Complex Anaphylaxis  . Penicillins Anaphylaxis  . Tomato Hives and Shortness Of Breath    Hives in throat causes her trouble breathing   Labs:  Recent Labs  02/11/13 0620 02/12/13 0632 02/13/13 0615  HGB  --   --  9.6*  HCT  --   --  29.3*  PLT  --   --  263  LABPROT 14.4 14.9 15.5*  INR 1.14 1.19 1.25  CREATININE  --   --  0.85    Estimated Creatinine Clearance: 65.1 ml/min (by C-G formula based on Cr of 0.85).   Assessment: 57 yo female s/p partial right foot amputation and debridement. Patient on coumadin for VTE prophylaxis. Patient also on lovenox 78m sq q24hr. INR 1.25 past first dose of coumadin given yesterday. No bleeding noted but Hgb down a bit - will watch.  Goal of Therapy:  INR 2-3 Monitor platelets by anticoagulation protocol: Yes   Plan: 1) Coumadin 5 mg po x 1 again tonight 2) Daily INR  Thank you, CSherlon Handing PharmD, BCPS Clinical pharmacist, pager [phone]/17/2014 1:42 PM

## 2013-02-14 HISTORY — PX: DEBRIDEMENT  FOOT: SUR387

## 2013-02-14 LAB — CBC
HCT: 29.4 % — ABNORMAL LOW (ref 36.0–46.0)
Hemoglobin: 9.5 g/dL — ABNORMAL LOW (ref 12.0–15.0)
MCHC: 32.3 g/dL (ref 30.0–36.0)
MCV: 79.5 fL (ref 78.0–100.0)
RDW: 13.7 % (ref 11.5–15.5)
WBC: 8.3 10*3/uL (ref 4.0–10.5)

## 2013-02-14 LAB — PROTIME-INR: INR: 1.83 — ABNORMAL HIGH (ref 0.00–1.49)

## 2013-02-14 LAB — GLUCOSE, CAPILLARY: Glucose-Capillary: 152 mg/dL — ABNORMAL HIGH (ref 70–99)

## 2013-02-14 LAB — CULTURE, ROUTINE-ABSCESS

## 2013-02-14 MED ORDER — PATIENT'S GUIDE TO USING COUMADIN BOOK
Freq: Once | Status: AC
  Administered 2013-02-14: 16:00:00
  Filled 2013-02-14: qty 1

## 2013-02-14 MED ORDER — WARFARIN VIDEO
Freq: Once | Status: AC
  Administered 2013-02-14: 16:00:00

## 2013-02-14 MED ORDER — WARFARIN SODIUM 2.5 MG PO TABS
2.5000 mg | ORAL_TABLET | Freq: Once | ORAL | Status: AC
  Administered 2013-02-14: 2.5 mg via ORAL
  Filled 2013-02-14: qty 1

## 2013-02-14 MED ORDER — CIPROFLOXACIN HCL 500 MG PO TABS
500.0000 mg | ORAL_TABLET | Freq: Two times a day (BID) | ORAL | Status: DC
  Administered 2013-02-14 – 2013-02-20 (×13): 500 mg via ORAL
  Filled 2013-02-14 (×15): qty 1

## 2013-02-14 NOTE — Progress Notes (Signed)
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 NWB on RLE. Progressing nicely. Still limited by general fatigue and pain with all mobility. CIR remains the best d/c plan with goal of patient being modified independent for d/c home.     Follow Up Recommendations  CIR     Does the patient have the potential to tolerate intense rehabilitation     Barriers to Discharge        Equipment Recommendations  Rolling walker with 5" wheels    Recommendations for Other Services    Frequency Min 3X/week   Plan Discharge plan remains appropriate;Frequency remains appropriate    Precautions / Restrictions Precautions Precautions: Fall Restrictions Weight Bearing Restrictions: No RLE Weight Bearing: Non weight bearing   Pertinent Vitals/Pain Reports 8/10 foot pain and increased R hip pain following session; educated her on seated piriformis stretch for possible relief in her hip, she says it travels into her leg (radicular?); called nsg following session for pain meds    Mobility  Bed Mobility Bed Mobility: Supine to Sit Supine to Sit: 6: Modified independent (Device/Increase time) Transfers Transfers: Sit to Stand;Stand to Sit Sit to Stand: 4: Min assist;With upper extremity assist;From bed;From chair/3-in-1;4: Min guard Stand to Sit: With upper extremity assist;To chair/3-in-1;4: Min guard Details for Transfer Assistance: sit<>stand x10 for strengthening and technique, min facilitation for stability; cues for sequencing and efficiency of movement Ambulation/Gait Ambulation/Gait Assistance: 4: Min assist Ambulation Distance (Feet): 30 Feet Ambulation/Gait Assistance Details: 10 ft + 20 ft with seated rest on the toilet, needs several standing rest breaks  due to fatigue; sequencing cues as well as stability assist Gait velocity: decreased General Gait Details: hop-to pattern    Exercises Other Exercises Other Exercises: chair push up x10    PT Goals Acute Rehab PT Goals PT Goal: Supine/Side to Sit - Progress: Progressing toward goal PT Goal: Sit to Stand - Progress: Progressing toward goal PT Goal: Stand to Sit - Progress: Progressing toward goal PT Transfer Goal: Bed to Chair/Chair to Bed - Progress: Progressing toward goal PT Goal: Ambulate - Progress: Progressing toward goal  Visit Information  Last PT Received On: 02/14/13 Assistance Needed: +1    Subjective Data  Subjective: My leg was burning last night.    Cognition  Cognition Arousal/Alertness: Awake/alert Behavior During Therapy: WFL for tasks assessed/performed Overall Cognitive Status: Within Functional Limits for tasks assessed General Comments: anxious about mobility and pain    Balance     End of Session PT - End of Session Equipment Utilized During Treatment: Gait belt Activity Tolerance: Patient tolerated treatment well;Patient limited by fatigue Patient left: in chair;with call bell/phone within reach Nurse Communication: Mobility status   GP     Zachary 02/14/2013, 1:08 PM

## 2013-02-14 NOTE — Progress Notes (Signed)
[facility] PROGRESS NOTE  [name] [name] WLS:[id number] DOB: 07/31/1956 DOA: 02/03/2013 PCP: Garnet Koyanagi, DO  Assessment/Plan: Active Problems:   Type II or unspecified type diabetes mellitus with unspecified complication, uncontrolled   HYPERLIPIDEMIA   ANXIETY   HYPERTENSION   Cirrhosis of liver without mention of alcohol   Cellulitis and abscess of foot   Diabetic foot ulcer associated with type 2 diabetes mellitus   Hyponatremia   Hypokalemia   Anemia   Dehydration   Diabetic foot ulcer on right lower extremity with cellulitis of the foot s/p R foot debridement and 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 Doing ok and stable.  -continue Vicodin-Increased to 2 tabs every 4 hours prn.  Spaced out Dilaudid 41m to q4 prm IV -WBC's WNL now and patient afebrile.  Uncontrolled type 2 diabetes  -Resistant sliding scale. Continue home Lantus. Hemoglobin A1c 12.1 (down from 14) sugesting blood sugars have been uncontrolled at home; but slightly better.  -CBG's now within 120-170 range. -continue SSI and lantus 45 units  Hypokalemia and hyponatremia  -Likely due to uncontrolled diabetes.  -Replace potassium as needed.   -Sodium and potassium WNL -continue monitoring and replete as needed  Dehydration 2/2 to hyperosmolar nont-ketotic state -Improved after IVF's. -Good urine output -patient with good PO intake  Anemia  Likely due to chronic disease. Anemia panel corroborates diagnosis of AOCD.  -Will monitor Hgb especially after surgery -hemoglobin down to 9 from 11 pre-op  Hypertension  Stable. Continue low sodium diet.  Neuropathy: -continue gabapentin 300 tid -continue lyrica 50 bid [may need to  address as outpatient need for both?]  Hyperlipidemia  Stable. Cont Atorvastatin 10 daily  Anxiety/depression: Will continue buspa 5 tid/cymbalta 60/ Lexapro 10 daily  Prophylaxis  Lovenox.   Code Status: full Family Communication:No family at bedside Disposition Plan:  To be detrmermine-await insurance decision re: CRI vs SNF   Consultants:  Orthopedics  Procedures:  See below for x-ray results  Antibiotics:  Vancomycin 02/03/13--02/11/13  primaxin 02/03/13--02/11/13  Doxycycline 02/12/13 (plan is for a total of 3-4 weeks)  HPI/Subjective: Reports pain on her foot; no fever, no overnight complaints   Objective: Filed Vitals:   02/13/13 0513 02/13/13 1352 02/13/13 2147 02/14/13 0510  BP: 121/78 125/70 103/57 122/70  Pulse: 91 84 85 86  Temp: 97.9 F (36.6 C) 98 F (36.7 C) 98.5 F (36.9 C) 98 F (36.7 C)  TempSrc: Oral Oral Oral Oral  Resp: 16 20 18 16   Height:      Weight:    73.8 kg (162 lb 11.2 oz)  SpO2: 95% 98% 96% 98%    Intake/Output Summary (Last 24 hours) at 02/14/13 1344 Last data filed at 02/14/13 1000  Gross per 24 hour  Intake    600 ml  Output   2600 ml  Net  -2000 ml    Exam: GEN: AAOX3, no fever, complaining of mild foot pain Cardio: regular rate and rhythm, S1, S2 normal, no murmur, rubs or gallops Lungs: CTA bilaterally GI: soft, non-tender; bowel sounds normal; no masses, no organomegaly  Extremities: RLE with Good pulses; Trace edema. Clean gauzes and dressing after fifth ray amputation and surgical debridement on 4/15  Data Reviewed: Basic Metabolic Panel:  Recent Labs Lab  02/10/13 0730 02/13/13 0615  NA 139 139  K 3.9 3.8  CL 103 103  CO2 31 32  GLUCOSE 151* 170*  BUN 11 10  CREATININE 0.77 0.85  CALCIUM 8.8 9.0    CBC:  Recent Labs Lab 02/09/13 0856 02/10/13 0730 02/13/13 0615 02/14/13 0605  WBC 8.8 8.9 9.4 8.3  HGB 11.0* 10.9* 9.6* 9.5*  HCT 33.0* 32.2* 29.3* 29.4*  MCV 79.1 78.5 78.8 79.5  PLT 263 243  263 277    CBG:  Recent Labs Lab 02/13/13 1222 02/13/13 1732 02/13/13 2142 02/14/13 0752 02/14/13 1223  GLUCAP 142* 264* 175* 166* 152*    Studies: Dg Chest 2 View  02/03/2013  *RADIOLOGY REPORT*  Clinical Data: Fall with left-sided rib pain.  CHEST - 2 VIEW  Comparison: 10/15/2012  Findings: Chest x-ray shows mild bibasilar atelectasis.  No pneumothorax, focal consolidation or pleural fluid is identified. The heart size is normal.  Visualized bony structures show no obvious acute rib fractures.  There is chronic deformity of the proximal left humerus related to prior fracture.  IMPRESSION: Mild bibasilar atelectasis.   Original Report Authenticated By: Aletta Edouard, M.D.    Dg Hip Complete Right  02/03/2013  *RADIOLOGY REPORT*  Clinical Data: Fall with right hip pain.  RIGHT HIP - COMPLETE 2+ VIEW  Comparison:  None.  Findings:  There is no evidence of hip fracture or dislocation. There is no evidence of arthropathy or other focal bone abnormality. The bony pelvis is intact.  IMPRESSION: Negative.   Original Report Authenticated By: Aletta Edouard, M.D.    Ct Abdomen Pelvis W Contrast  02/03/2013  *RADIOLOGY REPORT*  Clinical Data: Fall 5 days ago.  Right lower quadrant pain. Nausea.  History of "liver cancer."  Diabetes.  Hypertension.  CT ABDOMEN AND PELVIS WITH CONTRAST  Technique:  Multidetector CT imaging of the abdomen and pelvis was performed following the standard protocol during bolus administration of intravenous contrast.  Contrast: 1 OMNIPAQUE IOHEXOL 300 MG/ML  SOLN, 158m OMNIPAQUE IOHEXOL 300 MG/ML  SOLN  Comparison: 07/06/2010  Findings: Lung bases:  Clear lung bases.  Mild cardiomegaly.  A tiny hiatal hernia. Prominent right cardiophrenic angle node on image 10/series 2 is chronic and reactive.  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  Prominent left portal vein on image 18/series 2, similar to 06/04/2007.   Persistent splenomegaly, with the spleen measuring 13.7 cm cranial caudal.  Normal stomach, pancreas. Cholecystectomy without biliary ductal dilatation.  Normal adrenal glands.  Bilateral heterogeneous renal enhancement, especially on the delayed series seven.  Mildly prominent retroperitoneal nodes which are likely reactive and are unchanged.  Colonic stool burden suggests constipation.  Normal terminal ileum and appendix.  No pneumatosis or free intraperitoneal air.  Normal small bowel without abdominal ascites.  1.3 cm right external iliac node on image 84/series 2 is new since 07/06/2010.  There are also prominent right inguinal nodes which are newly enlarged.  Normal urinary bladder.  Hysterectomy. No significant free fluid.  Bones/Musculoskeletal:  No acute osseous abnormality.  IMPRESSION:  1.  No acute post-traumatic deformity identified. Heterogeneous renal enhancement is suspicious for bilateral pyelonephritis. 2.  Moderate cirrhosis with persistent splenomegaly. 3. Possible constipation. 4.  Normal appendix. 5.  Mild adenopathy in the right external iliac station with prominent right inguinal nodes.  Favored to be reactive.  Not in the typical drainage pattern for liver cancer metastasis.  Consider physical exam correlation.   Original Report Authenticated  By: Abigail Miyamoto, M.D.    Dg Foot Complete Right  02/03/2013  *RADIOLOGY REPORT*  Clinical Data: Diabetic foot.  Redness and swelling.  Lateral sore on bottom of foot at  head of first metatarsal.  RIGHT FOOT COMPLETE - 3+ VIEW  Comparison: None.  Findings: Minimal hallux valgus deformity.  There may be soft tissue swelling about the medial aspect of the first metatarsal phalangeal joint.  No osseous destruction.  Achilles and calcaneal spurs.  Probable dorsal forefoot soft tissue swelling as well.  No soft tissue gas or radiopaque foreign object.  Possible skin/soft tissue ulcer about the fifth metatarsal phalangeal joint, most apparent on the oblique  view.  IMPRESSION: No acute osseous abnormality.  Possible soft tissue defect about the lateral aspect of the fifth MTP joint.  Relatively diffuse soft tissue swelling about the forefoot.   Original Report Authenticated By: Abigail Miyamoto, M.D.     Scheduled Meds: . atorvastatin  10 mg Oral q1800  . busPIRone  5 mg Oral TID  . calcium-vitamin D  1 tablet Oral Daily  . cholecalciferol  2,000 Units Oral Daily  . ciprofloxacin  500 mg Oral BID  . doxycycline  100 mg Oral Q12H  . DULoxetine  60 mg Oral Daily  . enoxaparin (LOVENOX) injection  40 mg Subcutaneous Q24H  . escitalopram  10 mg Oral Daily  . gabapentin  300 mg Oral TID  . insulin aspart  0-20 Units Subcutaneous TID WC  . insulin aspart  0-5 Units Subcutaneous QHS  . insulin aspart  10 Units Subcutaneous TID WC  . insulin glargine  45 Units Subcutaneous QHS  . metaxalone  800 mg Oral TID  . multivitamin with minerals  1 tablet Oral Daily  . patient's guide to using coumadin book   Does not apply Once  . polyethylene glycol  17 g Oral Daily  . pregabalin  50 mg Oral BID  . vitamin C  500 mg Oral Daily  . warfarin  2.5 mg Oral ONCE-1800  . warfarin   Does not apply Once  . Warfarin - Pharmacist Dosing Inpatient   Does not apply q1800   Continuous Infusions:    Active Problems:   Type II or unspecified type diabetes mellitus with unspecified complication, uncontrolled   HYPERLIPIDEMIA   ANXIETY   HYPERTENSION   Cirrhosis of liver without mention of alcohol   Cellulitis and abscess of foot   Diabetic foot ulcer associated with type 2 diabetes mellitus   Hyponatremia   Hypokalemia   Anemia   Dehydration  Time spent: < 30 minutes   Nita Sells  [facility] Pager [phone]. If 8PM-8AM, please contact night-coverage at [email or web address], password [facility] 02/14/2013, 1:44 PM  LOS: 11 days

## 2013-02-14 NOTE — Progress Notes (Signed)
[place] for coumadin Indication: VTE prophylaxis  Allergies  Allergen Reactions  . Fish-Derived Products Hives, Shortness Of Breath, Swelling and Rash    Hives get in throat causing trouble breathing  . Mushroom Extract Complex Anaphylaxis  . Penicillins Anaphylaxis  . Tomato Hives and Shortness Of Breath    Hives in throat causes her trouble breathing   Labs:  Recent Labs  02/12/13 0632 02/13/13 0615 02/14/13 0605  HGB  --  9.6* 9.5*  HCT  --  29.3* 29.4*  PLT  --  263 277  LABPROT 14.9 15.5* 20.5*  INR 1.19 1.25 1.83*  CREATININE  --  0.85  --     Estimated Creatinine Clearance: 65.2 ml/min (by C-G formula based on Cr of 0.85).   Assessment: 57 yo female s/p partial right foot amputation and debridement. Patient on coumadin for VTE prophylaxis. Patient also on lovenox 59m sq q24hr. INR 1.83 after 2 doses of coumadin 5 mg.  No bleeding noted but Hgb down a bit - will watch. 4/14 R foot abscess wound cx = MSSA and enterobacter cloacae. Rec to add cipro to doxy to cover enterobacter.   Goal of Therapy:  INR 2-3 Monitor platelets by anticoagulation protocol: Yes   Plan: 1) Coumadin 2.5 mg po x 1 tonight 2) Daily INR 3) DC LMWH once INR > 2 4) coumadin book and video for education 5) rec to add cipro to cover enterobacter in wound  Thank you, MEudelia Bunch Pharm.D. [phone]/18/2014 11:46 AM

## 2013-02-14 NOTE — Progress Notes (Signed)
Rehab admissions - I placed a call to [facility].  They are closed today.  I did not hear back from a case manager yesterday about the request for acute inpatient rehab admission.  Will follow up on Monday if patient is not discharged over the weekend.  Call me for questions.  #[phone]

## 2013-02-14 NOTE — Clinical Social Work Placement (Signed)
     Clinical Social Work Department CLINICAL SOCIAL WORK PLACEMENT NOTE 02/14/2013  Patient:  [name] [name], [name] [name]  Account Number:  [id number] Admit date:  02/03/2013  Clinical Social Worker:  Otilio Saber  Date/time:  02/14/2013 04:39 PM  Clinical Social Work is seeking post-discharge placement for this patient at the following level of care:   [facility]   (*CSW will update this form in Epic as items are completed)   02/14/2013  Patient/family provided with [facility] Department of Clinical Social Works list of facilities offering this level of care within the geographic area requested by the patient (or if unable, by the patients family).  02/14/2013  Patient/family informed of their freedom to choose among providers that offer the needed level of care, that participate in Medicare, Medicaid or managed care program needed by the patient, have an available bed and are willing to accept the patient.  02/14/2013  Patient/family informed of [facility] ownership interest in [facility], as well as of the fact that they are under no obligation to receive care at this facility.  PASARR submitted to EDS on 02/14/2013 PASARR number received from [facility] on   FL2 transmitted to all facilities in geographic area requested by pt/family on  02/14/2013 FL2 transmitted to all facilities within larger geographic area on   Patient informed that his/her managed care company has contracts with or will negotiate with  certain facilities, including the following:     Patient/family informed of bed offers received:   Patient chooses bed at  Physician recommends and patient chooses bed at    Patient to be transferred to  on   Patient to be transferred to facility by   The following physician request were entered in Epic:   Additional Comments:

## 2013-02-15 LAB — CBC
HCT: 29.1 % — ABNORMAL LOW (ref 36.0–46.0)
Hemoglobin: 9.4 g/dL — ABNORMAL LOW (ref 12.0–15.0)
MCH: 25.8 pg — ABNORMAL LOW (ref 26.0–34.0)
MCV: 79.7 fL (ref 78.0–100.0)
RBC: 3.65 MIL/uL — ABNORMAL LOW (ref 3.87–5.11)
WBC: 7.9 10*3/uL (ref 4.0–10.5)

## 2013-02-15 LAB — GLUCOSE, CAPILLARY
Glucose-Capillary: 176 mg/dL — ABNORMAL HIGH (ref 70–99)
Glucose-Capillary: 149 mg/dL — ABNORMAL HIGH (ref 70–99)

## 2013-02-15 LAB — ANAEROBIC CULTURE

## 2013-02-15 MED ORDER — GABAPENTIN 400 MG PO CAPS
400.0000 mg | ORAL_CAPSULE | Freq: Three times a day (TID) | ORAL | Status: DC
  Administered 2013-02-15 – 2013-02-20 (×15): 400 mg via ORAL
  Filled 2013-02-15 (×16): qty 1

## 2013-02-15 NOTE — Progress Notes (Signed)
[place] for coumadin Indication: VTE prophylaxis  Allergies  Allergen Reactions  . Fish-Derived Products Hives, Shortness Of Breath, Swelling and Rash    Hives get in throat causing trouble breathing  . Mushroom Extract Complex Anaphylaxis  . Penicillins Anaphylaxis  . Tomato Hives and Shortness Of Breath    Hives in throat causes her trouble breathing   Labs:  Recent Labs  02/13/13 0615 02/14/13 0605 02/15/13 0449  HGB 9.6* 9.5* 9.4*  HCT 29.3* 29.4* 29.1*  PLT 263 277 291  LABPROT 15.5* 20.5* 29.8*  INR 1.25 1.83* 3.03*  CREATININE 0.85  --   --     Estimated Creatinine Clearance: 65.2 ml/min (by C-G formula based on Cr of 0.85).   Assessment: 57 yo female s/p partial right foot amputation and debridement. Patient on coumadin for VTE prophylaxis. Patient also on lovenox 9m sq q24hr. INR up to 3.03 after coumadin 5 -5- 2.5 mg doses. Large 9.3 sec jump in INR.  No bleeding noted but Hgb down a bit but stable - will watch. Cipro added 4/19 to cover enteorbacter in foot wound.   On doxy as well.   Goal of Therapy:  INR 2-3 Monitor platelets by anticoagulation protocol: Yes   Plan: 1) No coumadin today 2) Daily INR 3) DC LMWH today as INR > 2 4) coumadin book and video for education done 4/18  Thank you, MEudelia Bunch Pharm.D. [phone]/19/2014 9:48 AM

## 2013-02-15 NOTE — Progress Notes (Signed)
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 Doing ok and stable.  -continue Vicodin-Increased to 2 tabs every 4 hours prn.  Spaced out Dilaudid 103m to q4 prm IV -WBC's WNL now and patient afebrile. -continue Coumadin for prophylaxis for DVt risk reduction-INR about 3  Uncontrolled type 2 diabetes  -Resistant sliding scale. Continue home Lantus. Hemoglobin A1c 12.1 (down from 14) sugesting blood sugars have been uncontrolled at home; but slightly better.  -CBG's now within 120-170 range. -continue SSI and lantus 45 units  Hypokalemia and hyponatremia  -Likely due to uncontrolled diabetes.  -Replace potassium as needed.   -Sodium and potassium WNL -continue monitoring and replete as needed  Dehydration 2/2 to hyperosmolar nont-ketotic state -Improved after IVF's. -Good urine output -patient with good PO intake  Anemia  Likely due to chronic disease. Anemia panel corroborates diagnosis of AOCD.  -Will monitor Hgb especially after surgery -hemoglobin down to 9 from 11 pre-op  Hypertension  Stable. Continue low sodium  diet.  Neuropathy: -continue gabapentin 300 tid -continue lyrica 50 bid [may need to address as outpatient need for both?]  Hyperlipidemia  Stable. Cont Atorvastatin 10 daily  Anxiety/depression: Will continue buspa 5 tid/cymbalta 60/ Lexapro 10 daily  Prophylaxis  coumdin  Code Status: full Family Communication:No family at bedside Disposition Plan:  To be detrmermine-await insurance decision re: [facility] vs SNF   Consultants:  Orthopedics  Procedures:  See below for x-ray results  Antibiotics:  Vancomycin 02/03/13--02/11/13  primaxin 02/03/13--02/11/13  Doxycycline 02/12/13 (plan is for a total of 3-4 weeks)  HPI/Subjective: Reports pain on her foot; no fever.  Seems anxious No further issues. Somewhat ambulant   Objective: Filed Vitals:   02/14/13 1626 02/14/13 2205 02/15/13 0603 02/15/13 1420  BP: 119/64 134/70 132/68 133/68  Pulse: 88 89 81 88  Temp: 98.5 F (36.9 C) 98.1 F (36.7 C) 98 F (36.7 C) 98.1 F (36.7 C)  TempSrc: Oral Oral Oral Oral  Resp: 18 16 17 16   Height:      Weight:      SpO2: 96% 95% 95% 99%    Intake/Output Summary (Last 24 hours) at 02/15/13 1604 Last data filed at 02/15/13 1458  Gross per 24 hour  Intake    360 ml  Output      0 ml  Net    360 ml    Exam: GEN: AAOX3, no fever, complaining of mild foot pain Cardio: regular rate and rhythm, S1, S2 normal, no murmur, rubs or gallops Lungs: CTA bilaterally GI: soft, non-tender; bowel sounds normal; no masses, no organomegaly  Extremities: RLE with Good pulses; Trace edema. Clean gauzes and dressing after fifth ray amputation and  surgical debridement on 4/15  Data Reviewed: Basic Metabolic Panel:  Recent Labs Lab 02/10/13 0730 02/13/13 0615  NA 139 139  K 3.9 3.8  CL 103 103  CO2 31 32  GLUCOSE 151* 170*  BUN 11 10  CREATININE 0.77 0.85  CALCIUM 8.8 9.0    CBC:  Recent Labs Lab 02/09/13 0856 02/10/13 0730 02/13/13 0615 02/14/13 0605 02/15/13 0449  WBC 8.8  8.9 9.4 8.3 7.9  HGB 11.0* 10.9* 9.6* 9.5* 9.4*  HCT 33.0* 32.2* 29.3* 29.4* 29.1*  MCV 79.1 78.5 78.8 79.5 79.7  PLT 263 243 263 277 291    CBG:  Recent Labs Lab 02/14/13 1223 02/14/13 1722 02/14/13 2155 02/15/13 0805 02/15/13 1205  GLUCAP 152* 96 171* 176* 149*    Studies: Dg Chest 2 View  02/03/2013  *RADIOLOGY REPORT*  Clinical Data: Fall with left-sided rib pain.  CHEST - 2 VIEW  Comparison: 10/15/2012  Findings: Chest x-ray shows mild bibasilar atelectasis.  No pneumothorax, focal consolidation or pleural fluid is identified. The heart size is normal.  Visualized bony structures show no obvious acute rib fractures.  There is chronic deformity of the proximal left humerus related to prior fracture.  IMPRESSION: Mild bibasilar atelectasis.   Original Report Authenticated By: Aletta Edouard, M.D.    Dg Hip Complete Right  02/03/2013  *RADIOLOGY REPORT*  Clinical Data: Fall with right hip pain.  RIGHT HIP - COMPLETE 2+ VIEW  Comparison:  None.  Findings:  There is no evidence of hip fracture or dislocation. There is no evidence of arthropathy or other focal bone abnormality. The bony pelvis is intact.  IMPRESSION: Negative.   Original Report Authenticated By: Aletta Edouard, M.D.    Ct Abdomen Pelvis W Contrast  02/03/2013  *RADIOLOGY REPORT*  Clinical Data: Fall 5 days ago.  Right lower quadrant pain. Nausea.  History of "liver cancer."  Diabetes.  Hypertension.  CT ABDOMEN AND PELVIS WITH CONTRAST  Technique:  Multidetector CT imaging of the abdomen and pelvis was performed following the standard protocol during bolus administration of intravenous contrast.  Contrast: 1 OMNIPAQUE IOHEXOL 300 MG/ML  SOLN, 17m OMNIPAQUE IOHEXOL 300 MG/ML  SOLN  Comparison: 07/06/2010  Findings: Lung bases:  Clear lung bases.  Mild cardiomegaly.  A tiny hiatal hernia. Prominent right cardiophrenic angle node on image 10/series 2 is chronic and reactive.  Abdomen/pelvis:  Moderate cirrhosis, without focal  liver lesions. 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  Prominent left portal vein on image 18/series 2, similar to 06/04/2007.  Persistent splenomegaly, with the spleen measuring 13.7 cm cranial caudal.  Normal stomach, pancreas. Cholecystectomy without biliary ductal dilatation.  Normal adrenal glands.  Bilateral heterogeneous renal enhancement, especially on the delayed series seven.  Mildly prominent retroperitoneal nodes which are likely reactive and are unchanged.  Colonic stool burden suggests constipation.  Normal terminal ileum and appendix.  No pneumatosis or free intraperitoneal air.  Normal small bowel without abdominal ascites.  1.3 cm right external iliac node on image 84/series 2 is new since 07/06/2010.  There are also prominent right inguinal nodes which are newly enlarged.  Normal urinary bladder.  Hysterectomy. No significant free fluid.  Bones/Musculoskeletal:  No acute osseous abnormality.  IMPRESSION:  1.  No acute post-traumatic deformity identified. Heterogeneous renal enhancement is suspicious for bilateral pyelonephritis. 2.  Moderate cirrhosis with persistent splenomegaly. 3. Possible constipation. 4.  Normal appendix. 5.  Mild adenopathy in the right external iliac station with prominent right inguinal nodes.  Favored to be reactive.  Not in the typical drainage pattern for liver cancer metastasis.  Consider physical exam correlation.   Original Report Authenticated By: Abigail Miyamoto, M.D.    Dg Foot Complete Right  02/03/2013  *RADIOLOGY REPORT*  Clinical Data: Diabetic foot.  Redness and swelling.  Lateral sore on bottom of foot at  head of first metatarsal.  RIGHT FOOT COMPLETE - 3+ VIEW  Comparison: None.  Findings: Minimal hallux valgus deformity.  There may be soft tissue swelling about the medial aspect of the first metatarsal phalangeal joint.  No osseous destruction.  Achilles and calcaneal spurs.  Probable dorsal forefoot soft tissue swelling as well.  No soft tissue gas or  radiopaque foreign object.  Possible skin/soft tissue ulcer about the fifth metatarsal phalangeal joint, most apparent on the oblique view.  IMPRESSION: No acute osseous abnormality.  Possible soft tissue defect about the lateral aspect of the fifth MTP joint.  Relatively diffuse soft tissue swelling about the forefoot.   Original Report Authenticated By: Abigail Miyamoto, M.D.     Scheduled Meds: . atorvastatin  10 mg Oral q1800  . busPIRone  5 mg Oral TID  . calcium-vitamin D  1 tablet Oral Daily  . cholecalciferol  2,000 Units Oral Daily  . ciprofloxacin  500 mg Oral BID  . doxycycline  100 mg Oral Q12H  . DULoxetine  60 mg Oral Daily  . escitalopram  10 mg Oral Daily  . gabapentin  300 mg Oral TID  . insulin aspart  0-20 Units Subcutaneous TID WC  . insulin aspart  0-5 Units Subcutaneous QHS  . insulin aspart  10 Units Subcutaneous TID WC  . insulin glargine  45 Units Subcutaneous QHS  . metaxalone  800 mg Oral TID  . multivitamin with minerals  1 tablet Oral Daily  . polyethylene glycol  17 g Oral Daily  . pregabalin  50 mg Oral BID  . vitamin C  500 mg Oral Daily  . 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 If 8PM-8AM, please contact night-coverage at [email or web address], password [facility] 02/15/2013, 4:04 PM  LOS: 12 days

## 2013-02-16 LAB — GLUCOSE, CAPILLARY
Glucose-Capillary: 78 mg/dL (ref 70–99)
Glucose-Capillary: 153 mg/dL — ABNORMAL HIGH (ref 70–99)

## 2013-02-16 LAB — PROTIME-INR: Prothrombin Time: 26.3 seconds — ABNORMAL HIGH (ref 11.6–15.2)

## 2013-02-16 MED ORDER — WARFARIN SODIUM 3 MG PO TABS
3.0000 mg | ORAL_TABLET | Freq: Once | ORAL | Status: AC
  Administered 2013-02-16: 3 mg via ORAL
  Filled 2013-02-16: qty 1

## 2013-02-16 MED ORDER — HYDROMORPHONE HCL PF 1 MG/ML IJ SOLN: 1.0000 mg | Freq: Four times a day (QID) | INTRAMUSCULAR | Status: DC | PRN

## 2013-02-16 NOTE — Progress Notes (Signed)
[place] for coumadin Indication: VTE prophylaxis  Allergies  Allergen Reactions  . Fish-Derived Products Hives, Shortness Of Breath, Swelling and Rash    Hives get in throat causing trouble breathing  . Mushroom Extract Complex Anaphylaxis  . Penicillins Anaphylaxis  . Tomato Hives and Shortness Of Breath    Hives in throat causes her trouble breathing   Labs:  Recent Labs  02/14/13 0605 02/15/13 0449 02/16/13 0430  HGB 9.5* 9.4*  --   HCT 29.4* 29.1*  --   PLT 277 291  --   LABPROT 20.5* 29.8* 26.3*  INR 1.83* 3.03* 2.56*    Estimated Creatinine Clearance: 65.2 ml/min (by C-G formula based on Cr of 0.85).   Assessment: 57 yo female s/p partial right foot amputation and debridement. Patient on coumadin for VTE prophylaxis.  INR is therapeutic (2.56)  after coumadin 5 -5- 2.5 - 0  mg doses. LMWH stopped 4/19 with tx INR.   No bleeding noted but Hgb down a bit but stable - will watch. Cipro added 4/19 to cover enteorbacter in foot wound.   On doxy as well.   Goal of Therapy:  INR 2-3   Plan: 1) coumadin 3 mg po x 1 dose today 2) Daily INR 4) coumadin book and video for education done 4/18  Thank you, Eudelia Bunch, Pharm.D. [phone] 02/16/2013 8:27 AM

## 2013-02-16 NOTE — Progress Notes (Signed)
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 Doing ok and stable.  -continue Vicodin-Increased to 2 tabs every 4 hours prn.  Spaced out Dilaudid 75m to q4 prm IV -WBC's WNL now and patient afebrile. -continue Coumadin for prophylaxis for DVt risk reduction-INR about 3  Uncontrolled type 2 diabetes  -Resistant sliding scale. Continue home Lantus. Hemoglobin A1c 12.1 (down from 14) sugesting blood sugars have been uncontrolled at home; but slightly better.  -CBG's now within 120-170 range. -continue SSI and lantus 45 units  Hypokalemia and hyponatremia  -Likely due to uncontrolled diabetes.  -Replace potassium as needed.   -check am labs  Dehydration 2/2 to hyperosmolar nont-ketotic state -Improved after IVF's. -Good urine output -patient with good PO intake  Anemia  Likely due to chronic disease. Anemia panel corroborates diagnosis of AOCD.  -Will monitor Hgb especially after surgery -hemoglobin down to 9 from 11 pre-op  Hypertension  Stable. Continue low sodium diet.  Neuropathy: -continue gabapentin 400 tid -continue lyrica 50 bid  [may need to address as outpatient need for both?]  Hyperlipidemia  Stable. Cont Atorvastatin 10 daily  Anxiety/depression: Will continue buspa 5 tid/cymbalta 60/ Lexapro 10 daily  Prophylaxis  coumdin  Code Status: full Family Communication:No family at bedside Disposition Plan:  To be detrmermine-await insurance decision re: [facility] vs SNF   Consultants:  Orthopedics  Procedures:  See below for x-ray results  Antibiotics:  Vancomycin 02/03/13--02/11/13  primaxin 02/03/13--02/11/13  Doxycycline 02/12/13 (plan is for a total of 3-4 weeks)  HPI/Subjective: Doing well.  Spirits are good.  + stool.  No n/v/cp Ambulant to some extent   Objective: Filed Vitals:   02/15/13 1420 02/15/13 2153 02/16/13 0602 02/16/13 1403  BP: 133/68 154/81 137/75 143/74  Pulse: 88 97 82 91  Temp: 98.1 F (36.7 C) 98.8 F (37.1 C) 98.3 F (36.8 C) 98.2 F (36.8 C)  TempSrc: Oral Oral Oral Oral  Resp: 16 18 16 16   Height:      Weight:      SpO2: 99% 99% 100% 98%    Intake/Output Summary (Last 24 hours) at 02/16/13 1444 Last data filed at 02/15/13 1458  Gross per 24 hour  Intake    360 ml  Output      0 ml  Net    360 ml    Exam: GEN: AAOX3, no fever, complaining of mild foot pain Cardio: regular rate and rhythm, S1, S2 normal, no murmur, rubs or gallops Lungs: CTA bilaterally GI: soft, non-tender; bowel sounds normal; no masses, no organomegaly  Extremities: RLE with Good pulses; Trace edema. Clean gauzes and dressing after fifth ray amputation and surgical debridement on 4/15  Data  Reviewed: Basic Metabolic Panel:  Recent Labs Lab 02/10/13 0730 02/13/13 0615  NA 139 139  K 3.9 3.8  CL 103 103  CO2 31 32  GLUCOSE 151* 170*  BUN 11 10  CREATININE 0.77 0.85  CALCIUM 8.8 9.0    CBC:  Recent Labs Lab 02/10/13 0730 02/13/13 0615 02/14/13 0605 02/15/13 0449  WBC 8.9 9.4 8.3 7.9  HGB 10.9* 9.6* 9.5* 9.4*  HCT 32.2* 29.3* 29.4* 29.1*  MCV 78.5 78.8 79.5 79.7  PLT  243 263 277 291    CBG:  Recent Labs Lab 02/15/13 1205 02/15/13 1725 02/15/13 2153 02/16/13 0800 02/16/13 1217  GLUCAP 149* 132* 195* 78 118*    Studies: Dg Chest 2 View  02/03/2013  *RADIOLOGY REPORT*  Clinical Data: Fall with left-sided rib pain.  CHEST - 2 VIEW  Comparison: 10/15/2012  Findings: Chest x-ray shows mild bibasilar atelectasis.  No pneumothorax, focal consolidation or pleural fluid is identified. The heart size is normal.  Visualized bony structures show no obvious acute rib fractures.  There is chronic deformity of the proximal left humerus related to prior fracture.  IMPRESSION: Mild bibasilar atelectasis.   Original Report Authenticated By: Aletta Edouard, M.D.    Dg Hip Complete Right  02/03/2013  *RADIOLOGY REPORT*  Clinical Data: Fall with right hip pain.  RIGHT HIP - COMPLETE 2+ VIEW  Comparison:  None.  Findings:  There is no evidence of hip fracture or dislocation. There is no evidence of arthropathy or other focal bone abnormality. The bony pelvis is intact.  IMPRESSION: Negative.   Original Report Authenticated By: Aletta Edouard, M.D.    Ct Abdomen Pelvis W Contrast  02/03/2013  *RADIOLOGY REPORT*  Clinical Data: Fall 5 days ago.  Right lower quadrant pain. Nausea.  History of "liver cancer."  Diabetes.  Hypertension.  CT ABDOMEN AND PELVIS WITH CONTRAST  Technique:  Multidetector CT imaging of the abdomen and pelvis was performed following the standard protocol during bolus administration of intravenous contrast.  Contrast: 1 OMNIPAQUE IOHEXOL 300 MG/ML  SOLN, 153m OMNIPAQUE IOHEXOL 300 MG/ML  SOLN  Comparison: 07/06/2010  Findings: Lung bases:  Clear lung bases.  Mild cardiomegaly.  A tiny hiatal hernia. Prominent right cardiophrenic angle node on image 10/series 2 is chronic and reactive.  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  Prominent left portal vein on image 18/series 2, similar to 06/04/2007.   Persistent splenomegaly, with the spleen measuring 13.7 cm cranial caudal.  Normal stomach, pancreas. Cholecystectomy without biliary ductal dilatation.  Normal adrenal glands.  Bilateral heterogeneous renal enhancement, especially on the delayed series seven.  Mildly prominent retroperitoneal nodes which are likely reactive and are unchanged.  Colonic stool burden suggests constipation.  Normal terminal ileum and appendix.  No pneumatosis or free intraperitoneal air.  Normal small bowel without abdominal ascites.  1.3 cm right external iliac node on image 84/series 2 is new since 07/06/2010.  There are also prominent right inguinal nodes which are newly enlarged.  Normal urinary bladder.  Hysterectomy. No significant free fluid.  Bones/Musculoskeletal:  No acute osseous abnormality.  IMPRESSION:  1.  No acute post-traumatic deformity identified. Heterogeneous renal enhancement is suspicious for bilateral pyelonephritis. 2.  Moderate cirrhosis with persistent splenomegaly. 3. Possible constipation. 4.  Normal appendix. 5.  Mild adenopathy in the right external iliac station with prominent right inguinal nodes.  Favored to be reactive.  Not in the typical drainage pattern for liver cancer metastasis.  Consider  physical exam correlation.   Original Report Authenticated By: Abigail Miyamoto, M.D.    Dg Foot Complete Right  02/03/2013  *RADIOLOGY REPORT*  Clinical Data: Diabetic foot.  Redness and swelling.  Lateral sore on bottom of foot at  head of first metatarsal.  RIGHT FOOT COMPLETE - 3+ VIEW  Comparison: None.  Findings: Minimal hallux valgus deformity.  There may be soft tissue swelling about the medial aspect of the first metatarsal phalangeal joint.  No osseous destruction.  Achilles and calcaneal spurs.  Probable dorsal forefoot soft tissue swelling as well.  No soft tissue gas or radiopaque foreign object.  Possible skin/soft tissue ulcer about the fifth metatarsal phalangeal joint, most apparent on the  oblique view.  IMPRESSION: No acute osseous abnormality.  Possible soft tissue defect about the lateral aspect of the fifth MTP joint.  Relatively diffuse soft tissue swelling about the forefoot.   Original Report Authenticated By: Abigail Miyamoto, M.D.     Scheduled Meds: . atorvastatin  10 mg Oral q1800  . busPIRone  5 mg Oral TID  . calcium-vitamin D  1 tablet Oral Daily  . cholecalciferol  2,000 Units Oral Daily  . ciprofloxacin  500 mg Oral BID  . doxycycline  100 mg Oral Q12H  . DULoxetine  60 mg Oral Daily  . escitalopram  10 mg Oral Daily  . gabapentin  400 mg Oral TID  . insulin aspart  0-20 Units Subcutaneous TID WC  . insulin aspart  0-5 Units Subcutaneous QHS  . insulin aspart  10 Units Subcutaneous TID WC  . insulin glargine  45 Units Subcutaneous QHS  . metaxalone  800 mg Oral TID  . multivitamin with minerals  1 tablet Oral Daily  . polyethylene glycol  17 g Oral Daily  . pregabalin  50 mg Oral BID  . vitamin C  500 mg Oral Daily  . warfarin  3 mg Oral ONCE-1800  . Warfarin - Pharmacist Dosing Inpatient   Does not apply q1800   Continuous Infusions:    Active Problems:   Type II or unspecified type diabetes mellitus with unspecified complication, uncontrolled   HYPERLIPIDEMIA   ANXIETY   HYPERTENSION   Cirrhosis of liver without mention of alcohol   Cellulitis and abscess of foot   Diabetic foot ulcer associated with type 2 diabetes mellitus   Hyponatremia   Hypokalemia   Anemia   Dehydration  Time spent: < 30 minutes   Nita Sells  [facility] Pager [phone]. If 8PM-8AM, please contact night-coverage at [email or web address], password [facility] 02/16/2013, 2:44 PM  LOS: 13 days

## 2013-02-17 ENCOUNTER — Encounter: Payer: Self-pay | Admitting: Family Medicine

## 2013-02-17 LAB — BASIC METABOLIC PANEL
BUN: 14 mg/dL (ref 6–23)
Creatinine, Ser: 0.83 mg/dL (ref 0.50–1.10)
GFR calc Af Amer: 89 mL/min — ABNORMAL LOW (ref >90)
GFR calc non Af Amer: 77 mL/min — ABNORMAL LOW (ref >90)
Glucose, Bld: 111 mg/dL — ABNORMAL HIGH (ref 70–99)
Potassium: 3.6 mEq/L (ref 3.5–5.1)

## 2013-02-17 LAB — CBC
HCT: 27.8 % — ABNORMAL LOW (ref 36.0–46.0)
Hemoglobin: 9 g/dL — ABNORMAL LOW (ref 12.0–15.0)
MCH: 25.6 pg — ABNORMAL LOW (ref 26.0–34.0)
MCHC: 32.4 g/dL (ref 30.0–36.0)
RDW: 13.9 % (ref 11.5–15.5)

## 2013-02-17 LAB — GLUCOSE, CAPILLARY
Glucose-Capillary: 68 mg/dL — ABNORMAL LOW (ref 70–99)
Glucose-Capillary: 85 mg/dL (ref 70–99)
Glucose-Capillary: 101 mg/dL — ABNORMAL HIGH (ref 70–99)

## 2013-02-17 LAB — PROTIME-INR: Prothrombin Time: 27.3 seconds — ABNORMAL HIGH (ref 11.6–15.2)

## 2013-02-17 MED ORDER — WARFARIN SODIUM 2.5 MG PO TABS
2.5000 mg | ORAL_TABLET | Freq: Once | ORAL | Status: AC
  Administered 2013-02-17: 2.5 mg via ORAL
  Filled 2013-02-17 (×2): qty 1

## 2013-02-17 MED ORDER — HYDROCODONE-ACETAMINOPHEN 5-325 MG PO TABS: 2.0000 | ORAL_TABLET | ORAL | Status: DC | PRN

## 2013-02-17 MED ORDER — CIPROFLOXACIN HCL 500 MG PO TABS: 500.0000 mg | ORAL_TABLET | Freq: Two times a day (BID) | ORAL | Status: DC

## 2013-02-17 MED ORDER — DOXYCYCLINE HYCLATE 100 MG PO TABS: 100.0000 mg | ORAL_TABLET | Freq: Two times a day (BID) | ORAL | Status: DC

## 2013-02-17 MED ORDER — BUSPIRONE HCL 5 MG PO TABS: 5.0000 mg | ORAL_TABLET | Freq: Three times a day (TID) | ORAL | Status: DC

## 2013-02-17 MED ORDER — INSULIN GLULISINE 100 UNIT/ML ~~LOC~~ SOLN: 8.0000 [IU] | Freq: Two times a day (BID) | SUBCUTANEOUS | Status: DC

## 2013-02-17 NOTE — Discharge Summary (Signed)
Physician Discharge Summary  [name] [name] OVZ:[id number] DOB: [birth year] DOA: 02/03/2013  PCP: Garnet Koyanagi, DO  Admit date: 02/03/2013 Discharge date: 02/17/2013  Time spent: 45 minutes  Recommendations for Outpatient Follow-up:  1. Patient may nonweight bearing status on that foot. 2. Recommend CBC basic metabolic panel in one week and a PT/INR level 3. Does not need Coumadin beyond 4-6 weeks 4. Check sugars 4 times a day a.c. at bedtime given slightly low blood sugars branch discharge 5. Prescribed limited amount of opiates on discharge which be need to be continued 6. To complete course of antibiotics 03/04/2039  7. Physical therapy recommends either skilled inpatient rehabilitation versus skilled nursing facility placement  Discharge Diagnoses:  Active Problems:   Type II or unspecified type diabetes mellitus with unspecified complication, uncontrolled   HYPERLIPIDEMIA   ANXIETY   HYPERTENSION   Cirrhosis of liver without mention of alcohol   Cellulitis and abscess of foot   Diabetic foot ulcer associated with type 2 diabetes mellitus   Hyponatremia   Hypokalemia   Anemia   Dehydration   Discharge Condition: Good  Diet recommendation: Diabetic  Filed Weights   02/12/13 0513 02/14/13 0510 02/17/13 0536  Weight: 73.6 kg (162 lb 4.1 oz) 73.8 kg (162 lb 11.2 oz) 73.755 kg (162 lb 9.6 oz)    History of present illness:  57 year old Caucasian female admitted 02/03/2013 with followup cyst right foot patient placed on intermittent antibiotics without improvement noted some feverishness as well as some erythema of the LLE. Unfortunate blood cultures when drawn prior to vancomycin initiation. Emergently Dopplers were performed which ruled out DVT and orthopedics was consulted. 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 Doing ok and stable.  -continue Vicodin-Increased to 2 tabs every 4 hours prn.  -WBC's WNL now and patient afebrile.  -continue Coumadin for prophylaxis for DVt risk reduction-INR about 2.6 on d/c Uncontrolled type 2 diabetes  -Resistant sliding scale. Continue home Lantus. Hemoglobin A1c 12.1 (down from 14) sugesting blood sugars have been uncontrolled at home; but slightly better.  -CBG's now within 120-170 range.  -continue SSI and lantus 45 units  -Came slightly glycemic prior to discharge-switched her apidra but at lower dose of 8 units twice a day  Hypokalemia and hyponatremia  -Likely due to uncontrolled diabetes.  -Replace potassium as needed.   Dehydration 2/2 to hyperosmolar nont-ketotic state  -Improved after IVF's.  -Good urine output  -patient with good PO intake  Anemia  Likely due to chronic disease. Anemia panel corroborates diagnosis of AOCD.  -Will monitor Hgb especially after surgery  -hemoglobin down to 9 from 11 pre-op  Hypertension  Stable. Continue low sodium diet.  Neuropathy:  -continue gabapentin 400 tid  -continue lyrica 50 bid [may need to address as outpatient need for both?]  Hyperlipidemia  Stable. Cont Atorvastatin 10 daily  Anxiety/depression: Will continue buspa 5 tid/cymbalta 60/ Lexapro 10 daily   Procedures:  Surgery performed left lower extremity 02/10/2013 Consultations:  Orthopedics  Discharge Exam: Filed Vitals:  02/16/13 0602 02/16/13 1403 02/16/13 2146 02/17/13 0536  BP: 137/75 143/74 134/71 124/61  Pulse: 82 91 85 74  Temp: 98.3 F (36.8 C) 98.2 F (36.8 C)  98 F (36.7 C) 97.8 F (36.6 C)  TempSrc: Oral Oral Oral Oral  Resp: 16 16 16 18   Height:      Weight:    73.755 kg (162 lb 9.6 oz)  SpO2: 100% 98% 98% 98%   Well slightly anxious  General: nad Cardiovascular: s1 s 2no m/r/g Respiratory: clear  Discharge Instructions  Discharge Orders   Future Orders Complete By Expires     Diet - low sodium heart healthy  As directed     Elevate operative extremity  As directed     Increase activity slowly  As directed     Non weight bearing  As directed     Comments:      Nonweightbearing right foot.        Medication List    STOP taking these medications       HYDROcodone-ibuprofen 7.5-200 MG per tablet  Commonly known as:  VICOPROFEN      TAKE these medications       atorvastatin 10 MG tablet  Commonly known as:  LIPITOR  Take 1 tablet (10 mg total) by mouth daily.     busPIRone 5 MG tablet  Commonly known as:  BUSPAR  Take 1 tablet (5 mg total) by mouth 3 (three) times daily.     CALTRATE 600+D 600-400 MG-UNIT per tablet  Generic drug:  Calcium Carbonate-Vitamin D  Take 1 tablet by mouth daily.     Cinnamon 500 MG Tabs  Take 1 tablet by mouth daily.     ciprofloxacin 500 MG tablet  Commonly known as:  CIPRO  Take 1 tablet (500 mg total) by mouth 2 (two) times daily.     doxycycline 100 MG tablet  Commonly known as:  VIBRA-TABS  Take 1 tablet (100 mg total) by mouth every 12 (twelve) hours.     DULoxetine 60 MG capsule  Commonly known as:  CYMBALTA  Take 1 capsule (60 mg total) by mouth daily.     escitalopram 10 MG tablet  Commonly known as:  LEXAPRO  Take 10 mg by mouth daily.     gabapentin 300 MG capsule  Commonly known as:  NEURONTIN  Take 2 capsules (600 mg total) by mouth 3 (three) times daily.     hydrochlorothiazide 25 MG tablet  Commonly known as:  HYDRODIURIL  Take 1 tablet (25 mg total) by mouth daily.     HYDROcodone-acetaminophen 5-325 MG per tablet  Commonly known as:  NORCO/VICODIN   Take 2 tablets by mouth every 4 (four) hours as needed.     insulin glargine 100 UNIT/ML injection  Commonly known as:  LANTUS SOLOSTAR  Inject 40 Units into the skin at bedtime.     insulin glulisine 100 UNIT/ML injection  Commonly known as:  APIDRA SOLOSTAR  Inject 8 Units into the skin 2 (two) times daily. 10 units at breakfast and 10 units at dinner     metaxalone 800 MG tablet  Commonly known as:  SKELAXIN  Take 1 tablet (800 mg total) by mouth 3 (three) times daily.     multivitamin tablet  Take 1 tablet by mouth daily.     polyethylene glycol packet  Commonly known as:  MIRALAX / GLYCOLAX  Take 17 g by mouth daily.     vitamin C 500 MG  tablet  Commonly known as:  ASCORBIC ACID  Take 500 mg by mouth daily.     Vitamin D 2000 UNITS tablet  Take 2,000 Units by mouth daily.           Follow-up Information   Follow up with DUDA,MARCUS V, MD. Call in 1 week.   Contact information:   [place] [address] [phone]        The results of significant diagnostics from this hospitalization (including imaging, microbiology, ancillary and laboratory) are listed below for reference.    Significant Diagnostic Studies: Dg Chest 2 View  02/03/2013  *RADIOLOGY REPORT*  Clinical Data: Fall with left-sided rib pain.  CHEST - 2 VIEW  Comparison: 10/15/2012  Findings: Chest x-ray shows mild bibasilar atelectasis.  No pneumothorax, focal consolidation or pleural fluid is identified. The heart size is normal.  Visualized bony structures show no obvious acute rib fractures.  There is chronic deformity of the proximal left humerus related to prior fracture.  IMPRESSION: Mild bibasilar atelectasis.   Original Report Authenticated By: Aletta Edouard, M.D.    Dg Hip Complete Right  02/05/2013  *RADIOLOGY REPORT*  Clinical Data: Right hip pain after fall  RIGHT HIP - COMPLETE 2+ VIEW  Comparison: None.  Findings: No fracture or dislocation is noted.  No degenerative  changes are noted.  IMPRESSION: Normal right hip.   Original Report Authenticated By: Marijo Conception.,  M.D.    Dg Hip Complete Right  02/03/2013  *RADIOLOGY REPORT*  Clinical Data: Fall with right hip pain.  RIGHT HIP - COMPLETE 2+ VIEW  Comparison:  None.  Findings:  There is no evidence of hip fracture or dislocation. There is no evidence of arthropathy or other focal bone abnormality. The bony pelvis is intact.  IMPRESSION: Negative.   Original Report Authenticated By: Aletta Edouard, M.D.    Ct Abdomen Pelvis W Contrast  02/03/2013  *RADIOLOGY REPORT*  Clinical Data: Fall 5 days ago.  Right lower quadrant pain. Nausea.  History of "liver cancer."  Diabetes.  Hypertension.  CT ABDOMEN AND PELVIS WITH CONTRAST  Technique:  Multidetector CT imaging of the abdomen and pelvis was performed following the standard protocol during bolus administration of intravenous contrast.  Contrast: 1 OMNIPAQUE IOHEXOL 300 MG/ML  SOLN, 161m OMNIPAQUE IOHEXOL 300 MG/ML  SOLN  Comparison: 07/06/2010  Findings: Lung bases:  Clear lung bases.  Mild cardiomegaly.  A tiny hiatal hernia. Prominent right cardiophrenic angle node on image 10/series 2 is chronic and reactive.  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  Prominent left portal vein on image 18/series 2, similar to 06/04/2007.  Persistent splenomegaly, with the spleen measuring 13.7 cm cranial caudal.  Normal stomach, pancreas. Cholecystectomy without biliary ductal dilatation.  Normal adrenal glands.  Bilateral heterogeneous renal enhancement, especially on the delayed series seven.  Mildly prominent retroperitoneal nodes which are likely reactive and are unchanged.  Colonic stool burden suggests constipation.  Normal terminal ileum and appendix.  No pneumatosis or free intraperitoneal air.  Normal small bowel without abdominal ascites.  1.3 cm right external iliac node on image 84/series 2 is new since  07/06/2010.  There are also prominent right inguinal nodes which are newly enlarged.  Normal urinary bladder.  Hysterectomy. No significant free fluid.  Bones/Musculoskeletal:  No acute osseous abnormality.  IMPRESSION:  1.  No acute post-traumatic deformity identified. Heterogeneous renal enhancement is suspicious for bilateral pyelonephritis. 2.  Moderate cirrhosis with  persistent splenomegaly. 3. Possible constipation. 4.  Normal appendix. 5.  Mild adenopathy in the right external iliac station with prominent right inguinal nodes.  Favored to be reactive.  Not in the typical drainage pattern for liver cancer metastasis.  Consider physical exam correlation.   Original Report Authenticated By: Abigail Miyamoto, M.D.    Mr Foot Right W Wo Contrast  02/05/2013  *RADIOLOGY REPORT*  Clinical Data: Dorsal foot ulcer.  History of diabetes.  MRI OF THE RIGHT FOREFOOT WITHOUT AND WITH CONTRAST  Technique:  Multiplanar, multisequence MR imaging was performed both before and after administration of intravenous contrast.  Contrast: 86m MULTIHANCE GADOBENATE DIMEGLUMINE 529 MG/ML IV SOLN  Comparison: Radiographs 02/03/2013.  Findings: There is diffuse forefoot soft tissue edema.  There are several ill-defined superficial fluid collections dorsal to the bases of the fourth and fifth metatarsals.  These are best seen on the T2 and postcontrast T1-weighted images.  The proximal extent is to the level of the cuboid and the distal extent to the mid metatarsals. Greatest extent is approximately 4.4 cm.  These fluid collections are primarily superficial to the extensor tendons, although mild tendon sheath involving cannot be excluded.  No plantar fluid collections are identified.  The flexor tendons and foot musculature appear normal.  There is no evidence of bone destruction, fracture or subluxation.  IMPRESSION:  1.  Dorsal forefoot soft tissue abscesses as described.  There is possible associated extensor tenosynovitis. 2.  No  evidence of osteomyelitis.   Original Report Authenticated By: WRichardean Sale M.D.    Dg Foot Complete Right  02/03/2013  *RADIOLOGY REPORT*  Clinical Data: Diabetic foot.  Redness and swelling.  Lateral sore on bottom of foot at  head of first metatarsal.  RIGHT FOOT COMPLETE - 3+ VIEW  Comparison: None.  Findings: Minimal hallux valgus deformity.  There may be soft tissue swelling about the medial aspect of the first metatarsal phalangeal joint.  No osseous destruction.  Achilles and calcaneal spurs.  Probable dorsal forefoot soft tissue swelling as well.  No soft tissue gas or radiopaque foreign object.  Possible skin/soft tissue ulcer about the fifth metatarsal phalangeal joint, most apparent on the oblique view.  IMPRESSION: No acute osseous abnormality.  Possible soft tissue defect about the lateral aspect of the fifth MTP joint.  Relatively diffuse soft tissue swelling about the forefoot.   Original Report Authenticated By: KAbigail Miyamoto M.D.     Microbiology: Recent Results (from the past 240 hour(s))  SURGICAL PCR SCREEN     Status: Abnormal   Collection Time    02/10/13  9:54 AM      Result Value Range Status   MRSA, PCR NEGATIVE  NEGATIVE Final   Staphylococcus aureus POSITIVE (*) NEGATIVE Final   Comment:            The Xpert SA Assay (FDA     approved for NASAL specimens     in patients over 218years of age),     is one component of     a comprehensive surveillance     program.  Test performance has     been validated by [employer]for patients greater     than or equal to 156year old.     It is not intended     to diagnose infection nor to     guide or monitor treatment.  ANAEROBIC CULTURE     Status: None   Collection Time  02/10/13  7:01 PM      Result Value Range Status   Specimen Description ABSCESS FOOT RIGHT   Final   Special Requests NONE   Final   Gram Stain     Final   Value: FEW WBC PRESENT,BOTH PMN AND MONONUCLEAR     NO SQUAMOUS EPITHELIAL CELLS SEEN      FEW GRAM POSITIVE COCCI     IN PAIRS   Culture NO ANAEROBES ISOLATED   Final   Report Status 02/15/2013 FINAL   Final  CULTURE, ROUTINE-ABSCESS     Status: None   Collection Time    02/10/13  7:01 PM      Result Value Range Status   Specimen Description ABSCESS FOOT RIGHT   Final   Special Requests NONE   Final   Gram Stain     Final   Value: FEW WBC PRESENT,BOTH PMN AND MONONUCLEAR     NO SQUAMOUS EPITHELIAL CELLS SEEN     FEW GRAM POSITIVE COCCI     IN PAIRS   Culture     Final   Value: MODERATE STAPHYLOCOCCUS AUREUS     Note: RIFAMPIN AND GENTAMICIN SHOULD NOT BE USED AS SINGLE DRUGS FOR TREATMENT OF STAPH INFECTIONS.     FEW ENTEROBACTER CLOACAE   Report Status 02/14/2013 FINAL   Final   Organism ID, Bacteria STAPHYLOCOCCUS AUREUS   Final   Organism ID, Bacteria ENTEROBACTER CLOACAE   Final     Labs: Basic Metabolic Panel:  Recent Labs Lab 02/13/13 0615 02/17/13 0529  NA 139 140  K 3.8 3.6  CL 103 106  CO2 32 29  GLUCOSE 170* 111*  BUN 10 14  CREATININE 0.85 0.83  CALCIUM 9.0 8.5   Liver Function Tests: No results found for this basename: AST, ALT, ALKPHOS, BILITOT, PROT, ALBUMIN,  in the last 168 hours No results found for this basename: LIPASE, AMYLASE,  in the last 168 hours No results found for this basename: AMMONIA,  in the last 168 hours CBC:  Recent Labs Lab 02/13/13 0615 02/14/13 0605 02/15/13 0449 02/17/13 0529  WBC 9.4 8.3 7.9 7.5  HGB 9.6* 9.5* 9.4* 9.0*  HCT 29.3* 29.4* 29.1* 27.8*  MCV 78.8 79.5 79.7 79.2  PLT 263 277 291 266   Cardiac Enzymes: No results found for this basename: CKTOTAL, CKMB, CKMBINDEX, TROPONINI,  in the last 168 hours BNP: BNP (last 3 results) No results found for this basename: PROBNP,  in the last 8760 hours CBG:  Recent Labs Lab 02/16/13 1217 02/16/13 1712 02/16/13 2145 02/17/13 0751 02/17/13 0818  GLUCAP 118* 153* 131* 68* 85       Signed:  Keegan Ducey, JAI-GURMUKH  [facility] 02/17/2013, 11:02 AM

## 2013-02-17 NOTE — Progress Notes (Signed)
Occupational Therapy Treatment Patient Details Name: [name] [name] MRN: [id number] DOB: 07-Aug-1956 Today's Date: 02/17/2013 Time: 8657-8469 OT Time Calculation (min): 30 min  OT Assessment / Plan / Recommendation Comments on Treatment Session Patient demonstrated increased safety techniques with ADL transfers/mobility during treatment session today.    Follow Up Recommendations  CIR    Equipment Recommendations  3 in 1 bedside comode    Frequency Min 2X/week   Plan Discharge plan remains appropriate    Precautions / Restrictions Precautions Precautions: Fall Restrictions Weight Bearing Restrictions: Yes RLE Weight Bearing: Non weight bearing   Pertinent Vitals/Pain     ADL  Grooming: Performed;Wash/dry face;Brushing hair;Min guard Where Assessed - Grooming: Supported standing (with RW, also leans against sink) Lower Body Dressing: Performed;Set up (don L sock only) Where Assessed - Lower Body Dressing: Supported sitting Toilet Transfer: Performed;Min guard Armed forces technical officer Method: Sit to Loss adjuster, chartered: Therapist, occupational and Hygiene: Performed;Min guard Where Assessed - Best boy and Hygiene: Sit to stand from 3-in-1 or toilet Transfers/Ambulation Related to ADLs: Patient ambulated in room from chair to and from bathroom with min guard assistance.  ADL Comments: Incr time due to pain.      OT Goals ADL Goals ADL Goal: Grooming - Progress: Progressing toward goals ADL Goal: Lower Body Bathing - Progress: Progressing toward goals ADL Goal: Lower Body Dressing - Progress: Progressing toward goals ADL Goal: Toilet Transfer - Progress: Progressing toward goals ADL Goal: Toileting - Clothing Manipulation - Progress: Progressing toward goals ADL Goal: Toileting - Hygiene - Progress: Progressing toward goals  Visit Information  Last OT Received On: 02/17/13 Assistance Needed: +1    Cognition  Cognition Arousal/Alertness: Awake/alert Behavior During Therapy: WFL for tasks assessed/performed Overall Cognitive Status: Within Functional Limits for tasks assessed General Comments: Patient somewhat anxious, very talkative and needed redirection to pay attention to tasks at hand    End of Session OT - End of Session Equipment Utilized During Treatment: Gait belt Activity Tolerance: Patient tolerated treatment well Patient left: in chair;with call bell/phone within reach  GO     Thang Flett A 02/17/2013, 11:18 AM

## 2013-02-17 NOTE — Progress Notes (Signed)
ANTICOAGULATION CONSULT NOTE - Follow Up Consult  Pharmacy Consult for Coumadin Indication: VTE prophylaxis  Allergies  Allergen Reactions  . Fish-Derived Products Hives, Shortness Of Breath, Swelling and Rash    Hives get in throat causing trouble breathing  . Mushroom Extract Complex Anaphylaxis  . Penicillins Anaphylaxis  . Tomato Hives and Shortness Of Breath    Hives in throat causes her trouble breathing    Patient Measurements: Height: 4' 11.84" (152 cm) Weight: 162 lb 9.6 oz (73.755 kg) IBW/kg (Calculated) : 45.14 Heparin Dosing Weight:   Vital Signs: Temp: 97.8 F (36.6 C) (04/21 0536) Temp src: Oral (04/21 0536) BP: 124/61 mmHg (04/21 0536) Pulse Rate: 74 (04/21 0536)  Labs:  Recent Labs  02/15/13 0449 02/16/13 0430 02/17/13 0529  HGB 9.4*  --  9.0*  HCT 29.1*  --  27.8*  PLT 291  --  266  LABPROT 29.8* 26.3* 27.3*  INR 3.03* 2.56* 2.69*  CREATININE  --   --  0.83    Estimated Creatinine Clearance: 66.8 ml/min (by C-G formula based on Cr of 0.83).   Medications:  Scheduled:  . atorvastatin  10 mg Oral q1800  . busPIRone  5 mg Oral TID  . calcium-vitamin D  1 tablet Oral Daily  . cholecalciferol  2,000 Units Oral Daily  . ciprofloxacin  500 mg Oral BID  . doxycycline  100 mg Oral Q12H  . DULoxetine  60 mg Oral Daily  . escitalopram  10 mg Oral Daily  . gabapentin  400 mg Oral TID  . insulin aspart  0-20 Units Subcutaneous TID WC  . insulin aspart  0-5 Units Subcutaneous QHS  . insulin aspart  10 Units Subcutaneous TID WC  . insulin glargine  45 Units Subcutaneous QHS  . metaxalone  800 mg Oral TID  . multivitamin with minerals  1 tablet Oral Daily  . polyethylene glycol  17 g Oral Daily  . pregabalin  50 mg Oral BID  . vitamin C  500 mg Oral Daily  . [COMPLETED] warfarin  3 mg Oral ONCE-1800  . 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  INR  2.69, up again after held x 1 day on 4/19.  Pt appears sensitve; also on antibiotics which may increase Coumadin effect.  No bleeding noted, Hg down some but essentially stable.  Goal of Therapy:  INR 2-3 Monitor platelets by anticoagulation protocol: Yes   Plan:  1.  Coumadin 2.64m today 2.  F/U in AM  KGracy Bruins PharmD [facility]

## 2013-02-17 NOTE — Progress Notes (Signed)
Hypoglycemic Event  CBG: 68  Treatment: 15 GM carbohydrate snack  Symptoms: None  Follow-up CBG: Time:0820 CBG Result:85  Possible Reasons for Event: Unknown  Comments/MD notified    [name] [name]  Remember to initiate Hypoglycemia Order Set & complete

## 2013-02-17 NOTE — Progress Notes (Signed)
Physical Therapy Treatment Patient Details Name: [name] [name] MRN: [id number] DOB: 1955-12-26 Today's Date: 02/17/2013 Time: 8242-3536 PT Time Calculation (min): 28 min  PT Assessment / Plan / Recommendation Comments on Treatment Session  Pt is slowly progressing with mobility. Pt is highly motivated to get better and increase mobility. Presents with overall decreased activity tolerance, decreased indpendence with gt and mobility secondary to pain and NWB status on R LE. Would benefit from CIR to increase independence and to return to [facility] for safe D/C home. Will cont to f/u with pt to maximize functional mobility.    Follow Up Recommendations  CIR     Does the patient have the potential to tolerate intense rehabilitation     Barriers to Discharge        Equipment Recommendations  Rolling walker with 5" wheels    Recommendations for Other Services    Frequency Min 3X/week   Plan Discharge plan remains appropriate;Frequency remains appropriate    Precautions / Restrictions Precautions Precautions: Fall Restrictions Weight Bearing Restrictions: Yes RLE Weight Bearing: Non weight bearing   Pertinent Vitals/Pain 8/10 at end of session. Pt repositioned in bed.     Mobility  Bed Mobility Bed Mobility: Sit to Supine Sit to Supine: 6: Modified independent (Device/Increase time);With rail;HOB elevated Details for Bed Mobility Assistance: demo good technique to transfer sit to supine  Transfers Transfers: Sit to Stand;Stand to Sit Sit to Stand: 4: Min guard;From chair/3-in-1;With upper extremity assist;From bed (sit <> stand x 4) Stand to Sit: 5: Supervision;With armrests;To bed Details for Transfer Assistance: required min guard for initial sit to <>stand to steady; pt demo mild balance deficits secondary to pain and NWB status on R LE; performed sit <> stand x4 for strengthening . demo ability to maintain NWB status on R LE  Ambulation/Gait Ambulation/Gait Assistance: 4: Min  guard Ambulation Distance (Feet): 12 Feet (x2) Assistive device: Rolling walker Ambulation/Gait Assistance Details: required 1 standing rest break secondary to faitgue and pain; pt required vc's for gt sequencing and RW safety; pt very anxious with amb; requires max encouragement  Gait Pattern: Step-to pattern;Trunk flexed Gait velocity: decreased  General Gait Details: hop-to pattern Stairs: No Wheelchair Mobility Wheelchair Mobility: No    Exercises General Exercises - Lower Extremity Quad Sets: AROM;Both;10 reps;Supine Short Arc Quad: Left;AROM;10 reps;Supine Long Arc Quad: AROM;Right;10 reps;Seated   PT Diagnosis:    PT Problem List:   PT Treatment Interventions:     PT Goals Acute Rehab PT Goals PT Goal Formulation: With patient Time For Goal Achievement: 02/18/13 Potential to Achieve Goals: Good PT Goal: Sit to Supine/Side - Progress: Progressing toward goal PT Goal: Sit to Stand - Progress: Progressing toward goal PT Goal: Stand to Sit - Progress: Progressing toward goal PT Transfer Goal: Bed to Chair/Chair to Bed - Progress: Progressing toward goal PT Goal: Ambulate - Progress: Progressing toward goal  Visit Information  Last PT Received On: 02/17/13 Assistance Needed: +1    Subjective Data  Subjective: Pt sitting up in chair; agreeable to walk. " I can try and walk but I dont want to leave the room"  Patient Stated Goal: home   Cognition  Cognition Arousal/Alertness: Awake/alert Behavior During Therapy: WFL for tasks assessed/performed Overall Cognitive Status: Within Functional Limits for tasks assessed General Comments: required redirection to stay on task; easily distracted    Balance  Balance Balance Assessed: No  End of Session PT - End of Session Equipment Utilized During Treatment: Gait belt Activity Tolerance: Patient  tolerated treatment well Patient left: in chair;with call bell/phone within reach Nurse Communication: Mobility status   GP      Gustavus Bryant, [place] 02/17/2013, 2:16 PM

## 2013-02-18 LAB — GLUCOSE, CAPILLARY
Glucose-Capillary: 109 mg/dL — ABNORMAL HIGH (ref 70–99)
Glucose-Capillary: 122 mg/dL — ABNORMAL HIGH (ref 70–99)
Glucose-Capillary: 113 mg/dL — ABNORMAL HIGH (ref 70–99)

## 2013-02-18 MED ORDER — HYDROMORPHONE HCL PF 1 MG/ML IJ SOLN
1.0000 mg | Freq: Once | INTRAMUSCULAR | Status: AC
  Administered 2013-02-18: 1 mg via INTRAMUSCULAR
  Filled 2013-02-18: qty 1

## 2013-02-18 NOTE — Progress Notes (Signed)
Inpatient Diabetes Program Recommendations  AACE/ADA: New Consensus Statement on Inpatient Glycemic Control (2013)  Target Ranges:  Prepandial:   less than 140 mg/dL      Peak postprandial:   less than 180 mg/dL (1-2 hours)      Critically ill patients:  140 - 180 mg/dL   Reason for Visit:  Referral received from RN.  Spoke to patient regarding home diabetes management.  She states that she has been eating much better here at the hospital and is interested in better controlling her CBG's at home.  CBG's have been better controlled in hospital.  A1C=14.0%.  She states that she wants to do a better job taking care of herself.  She see's Dr. Loanne Drilling (endocrinology) and Dr. Etter Sjogren for PCP.  Will place order for Outpatient Diabetes Education per protocol.  Briefly explained the use of Novolog Meal coverage insulin and Lantus for basal insulin.  Also reviewed signs and symptoms and the proper treatment of hypoglycemia. Patient seems motivated to improve her self-care.  May consider decreasing Novolog meal coverage to 8 units tid with meals.

## 2013-02-18 NOTE — Progress Notes (Signed)
Occupational Therapy Treatment Patient Details Name: [name] [name] MRN: [id number] DOB: [birth year] Today's Date: 02/18/2013 Time: 1840-3754 OT Time Calculation (min): 45 min  OT Assessment / Plan / Recommendation Comments on Treatment Session Patient still requiring min guard A to min A for safety with ADLs. Continue to recommend CIR stay prior to discharge home.    Follow Up Recommendations  CIR    Equipment Recommendations  3 in 1 bedside comode    Frequency Min 2X/week   Plan Discharge plan remains appropriate    Precautions / Restrictions Precautions Precautions: Fall Restrictions Weight Bearing Restrictions: Yes RLE Weight Bearing: Non weight bearing   Pertinent Vitals/Pain     ADL  Grooming: Performed;Wash/dry face;Brushing hair;Min guard Where Assessed - Grooming: Supported standing Toilet Transfer: Performed;Min Psychiatric nurse Method: Sit to Loss adjuster, chartered: Comfort height toilet Toileting - Water quality scientist and Hygiene: Performed;Min guard Where Assessed - Best boy and Hygiene: Sit to stand from 3-in-1 or toilet Transfers/Ambulation Related to ADLs: Patient ambulated in room with RW NWB RLE and practiced moving items on countertops simulating mobility in kitchen to prepare light meals. She required min A with this task and needs more practice.  ADL Comments: Patient anxious re: discharge plans and emotional support provided. Patient feels she would not be safe at home alone at this time.      OT Goals ADL Goals ADL Goal: Grooming - Progress: Progressing toward goals ADL Goal: Toilet Transfer - Progress: Progressing toward goals ADL Goal: Toileting - Clothing Manipulation - Progress: Progressing toward goals ADL Goal: Toileting - Hygiene - Progress: Progressing toward goals ADL Goal: Additional Goal #1 - Progress: Progressing toward goals  Visit Information  Last OT Received On: 02/18/13 Assistance Needed:  +1    Cognition  Cognition Arousal/Alertness: Awake/alert Behavior During Therapy: WFL for tasks assessed/performed Overall Cognitive Status: Within Functional Limits for tasks assessed General Comments: required redirection to stay on task; easily distracted    End of Session OT - End of Session Equipment Utilized During Treatment: Gait belt Activity Tolerance: Patient tolerated treatment well Patient left: in chair;with call bell/phone within reach  GO     Abra Lingenfelter A 02/18/2013, 10:45 AM

## 2013-02-18 NOTE — Progress Notes (Signed)
[facility] PROGRESS NOTE  Patient has been stable for d/c since 4/21 but awaiting insurance coverage for discharge to either SNF/CIR  [name] [name] OFH:[id number] DOB: 18-Mar-1956 DOA: 02/03/2013 PCP: Garnet Koyanagi, DO  Assessment/Plan: Active Problems:   Type II or unspecified type diabetes mellitus with unspecified complication, uncontrolled   HYPERLIPIDEMIA   ANXIETY   HYPERTENSION   Cirrhosis of liver without mention of alcohol   Cellulitis and abscess of foot   Diabetic foot ulcer associated with type 2 diabetes mellitus   Hyponatremia   Hypokalemia   Anemia   Dehydration   Diabetic foot ulcer on right lower extremity with cellulitis of the foot s/p R foot debridement and 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 Doing ok and stable.  -continue Vicodin-Increased to 2 tabs every 4 hours prn.  Spaced out Dilaudid 47m to q4 prm IV -WBC's WNL now and patient afebrile. -continue Coumadin for prophylaxis for DVt risk reduction-INR 2.96  Uncontrolled type 2 diabetes  -Resistant sliding scale. Continue home Lantus. Hemoglobin A1c 12.1 (down from 14) sugesting blood sugars have been uncontrolled at home; but slightly better.  -CBG's now within 109-220 range. -continue SSI and lantus 45 units  Hypokalemia and hyponatremia  -Likely due to uncontrolled diabetes.  -Replace potassium as needed.   -check am labs  Dehydration 2/2 to hyperosmolar nont-ketotic state -Improved after IVF's. -Good urine output -patient with good PO intake  Anemia  Likely due to chronic disease. Anemia panel corroborates diagnosis of AOCD.  -Will monitor Hgb especially after surgery -hemoglobin down to 9 from 11  pre-op  Hypertension  Stable. Continue low sodium diet.  Neuropathy: -continue gabapentin 400 tid -continue lyrica 50 bid [may need to address as outpatient need for both?]  Hyperlipidemia  Stable. Cont Atorvastatin 10 daily  Anxiety/depression: Will continue buspa 5 tid/cymbalta 60/ Lexapro 10 daily  Prophylaxis  coumdin  Code Status: full Family Communication:No family at bedside Disposition Plan:  To be detrmermine-await insurance decision re: CIR vs SNF   Consultants:  Orthopedics  Procedures:  See below for x-ray results  Antibiotics:  Vancomycin 02/03/13--02/11/13  primaxin 02/03/13--02/11/13  Doxycycline 02/12/13 (plan is for a total of 3-4 weeks)  HPI/Subjective: Doing well.  Didn't sleep well last night.  Thinks her Restless legs has been acting up   Objective: Filed Vitals:   02/17/13 1333 02/17/13 2119 02/18/13 0506 02/18/13 1420  BP: 143/78 123/70 134/78 111/74  Pulse: 87 83 80 85  Temp: 97.5 F (36.4 C) 97.7 F (36.5 C) 97.7 F (36.5 C) 97.8 F (36.6 C)  TempSrc:  Oral Oral Oral  Resp: 20 18 18 18   Height:      Weight:      SpO2: 99% 100% 100% 99%    Intake/Output Summary (Last 24 hours) at 02/18/13 1645 Last data filed at 02/18/13 1300  Gross per 24 hour  Intake    960 ml  Output      0 ml  Net    960 ml    Exam: GEN: AAOX3, no fever, complaining of mild foot pain Cardio: regular rate and rhythm, S1, S2 normal, no murmur, rubs or gallops Lungs: CTA bilaterally GI: soft, non-tender; bowel sounds normal; no masses, no organomegaly  Extremities: RLE with Good  pulses; Trace edema. Clean gauzes and dressing after fifth ray amputation and surgical debridement on 4/15  Data Reviewed: Basic Metabolic Panel:  Recent Labs Lab 02/13/13 0615 02/17/13 0529  NA 139 140  K 3.8 3.6  CL 103 106  CO2 32 29  GLUCOSE 170* 111*  BUN 10 14  CREATININE 0.85 0.83  CALCIUM 9.0 8.5    CBC:  Recent Labs Lab 02/13/13 0615 02/14/13 0605  02/15/13 0449 02/17/13 0529  WBC 9.4 8.3 7.9 7.5  HGB 9.6* 9.5* 9.4* 9.0*  HCT 29.3* 29.4* 29.1* 27.8*  MCV 78.8 79.5 79.7 79.2  PLT 263 277 291 266    CBG:  Recent Labs Lab 02/17/13 0818 02/17/13 1159 02/17/13 1659 02/17/13 2232 02/18/13 0740  GLUCAP 85 166* 101* 206* 109*    Studies: Dg Chest 2 View  02/03/2013  *RADIOLOGY REPORT*  Clinical Data: Fall with left-sided rib pain.  CHEST - 2 VIEW  Comparison: 10/15/2012  Findings: Chest x-ray shows mild bibasilar atelectasis.  No pneumothorax, focal consolidation or pleural fluid is identified. The heart size is normal.  Visualized bony structures show no obvious acute rib fractures.  There is chronic deformity of the proximal left humerus related to prior fracture.  IMPRESSION: Mild bibasilar atelectasis.   Original Report Authenticated By: Aletta Edouard, M.D.    Dg Hip Complete Right  02/03/2013  *RADIOLOGY REPORT*  Clinical Data: Fall with right hip pain.  RIGHT HIP - COMPLETE 2+ VIEW  Comparison:  None.  Findings:  There is no evidence of hip fracture or dislocation. There is no evidence of arthropathy or other focal bone abnormality. The bony pelvis is intact.  IMPRESSION: Negative.   Original Report Authenticated By: Aletta Edouard, M.D.    Ct Abdomen Pelvis W Contrast  02/03/2013  *RADIOLOGY REPORT*  Clinical Data: Fall 5 days ago.  Right lower quadrant pain. Nausea.  History of "liver cancer."  Diabetes.  Hypertension.  CT ABDOMEN AND PELVIS WITH CONTRAST  Technique:  Multidetector CT imaging of the abdomen and pelvis was performed following the standard protocol during bolus administration of intravenous contrast.  Contrast: 1 OMNIPAQUE IOHEXOL 300 MG/ML  SOLN, 166m OMNIPAQUE IOHEXOL 300 MG/ML  SOLN  Comparison: 07/06/2010  Findings: Lung bases:  Clear lung bases.  Mild cardiomegaly.  A tiny hiatal hernia. Prominent right cardiophrenic angle node on image 10/series 2 is chronic and reactive.  Abdomen/pelvis:  Moderate cirrhosis,  without focal liver lesions. 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  Prominent left portal vein on image 18/series 2, similar to 06/04/2007.  Persistent splenomegaly, with the spleen measuring 13.7 cm cranial caudal.  Normal stomach, pancreas. Cholecystectomy without biliary ductal dilatation.  Normal adrenal glands.  Bilateral heterogeneous renal enhancement, especially on the delayed series seven.  Mildly prominent retroperitoneal nodes which are likely reactive and are unchanged.  Colonic stool burden suggests constipation.  Normal terminal ileum and appendix.  No pneumatosis or free intraperitoneal air.  Normal small bowel without abdominal ascites.  1.3 cm right external iliac node on image 84/series 2 is new since 07/06/2010.  There are also prominent right inguinal nodes which are newly enlarged.  Normal urinary bladder.  Hysterectomy. No significant free fluid.  Bones/Musculoskeletal:  No acute osseous abnormality.  IMPRESSION:  1.  No acute post-traumatic deformity identified. Heterogeneous renal enhancement is suspicious for bilateral pyelonephritis. 2.  Moderate cirrhosis with persistent splenomegaly. 3. Possible constipation. 4.  Normal appendix. 5.  Mild adenopathy in the right external iliac station with prominent right inguinal nodes.  Favored to be reactive.  Not in the typical drainage pattern for liver cancer metastasis.  Consider physical exam correlation.   Original Report Authenticated By: Abigail Miyamoto, M.D.    Dg Foot Complete Right  02/03/2013  *RADIOLOGY REPORT*  Clinical Data: Diabetic foot.  Redness and swelling.  Lateral sore on bottom of foot at  head of first metatarsal.  RIGHT FOOT COMPLETE - 3+ VIEW  Comparison: None.  Findings: Minimal hallux valgus deformity.  There may be soft tissue swelling about the medial aspect of the first metatarsal phalangeal joint.  No osseous destruction.  Achilles and calcaneal spurs.  Probable dorsal forefoot soft tissue swelling as well.  No soft  tissue gas or radiopaque foreign object.  Possible skin/soft tissue ulcer about the fifth metatarsal phalangeal joint, most apparent on the oblique view.  IMPRESSION: No acute osseous abnormality.  Possible soft tissue defect about the lateral aspect of the fifth MTP joint.  Relatively diffuse soft tissue swelling about the forefoot.   Original Report Authenticated By: Abigail Miyamoto, M.D.     Scheduled Meds: . atorvastatin  10 mg Oral q1800  . busPIRone  5 mg Oral TID  . calcium-vitamin D  1 tablet Oral Daily  . cholecalciferol  2,000 Units Oral Daily  . ciprofloxacin  500 mg Oral BID  . doxycycline  100 mg Oral Q12H  . DULoxetine  60 mg Oral Daily  . escitalopram  10 mg Oral Daily  . gabapentin  400 mg Oral TID  . insulin aspart  0-20 Units Subcutaneous TID WC  . insulin aspart  0-5 Units Subcutaneous QHS  . insulin aspart  10 Units Subcutaneous TID WC  . insulin glargine  45 Units Subcutaneous QHS  . metaxalone  800 mg Oral TID  . multivitamin with minerals  1 tablet Oral Daily  . polyethylene glycol  17 g Oral Daily  . pregabalin  50 mg Oral BID  . vitamin C  500 mg Oral Daily  . Warfarin - Pharmacist Dosing Inpatient   Does not apply q1800   Continuous Infusions:    Active Problems:   Type II or unspecified type diabetes mellitus with unspecified complication, uncontrolled   HYPERLIPIDEMIA   ANXIETY   HYPERTENSION   Cirrhosis of liver without mention of alcohol   Cellulitis and abscess of foot   Diabetic foot ulcer associated with type 2 diabetes mellitus   Hyponatremia   Hypokalemia   Anemia   Dehydration  Time spent: < 30 minutes   Nita Sells  [facility] Pager [phone]. If 8PM-8AM, please contact night-coverage at [email or web address], password [facility] 02/18/2013, 4:45 PM  LOS: 15 days

## 2013-02-18 NOTE — Progress Notes (Signed)
Rehab admissions - I called [employer] yesterday and have sent them updates.  I still have no response from [facility] regarding possible inpatient rehab admission.  I will update you if I hear something today.  Call me for questions.  #[phone]

## 2013-02-18 NOTE — Progress Notes (Signed)
ANTICOAGULATION CONSULT NOTE - Follow Up Consult  Pharmacy Consult for Coumadin Indication: VTE prophylaxis  Allergies  Allergen Reactions  . Fish-Derived Products Hives, Shortness Of Breath, Swelling and Rash    Hives get in throat causing trouble breathing  . Mushroom Extract Complex Anaphylaxis  . Penicillins Anaphylaxis  . Tomato Hives and Shortness Of Breath    Hives in throat causes her trouble breathing    Patient Measurements: Height: 4' 11.84" (152 cm) Weight: 162 lb 9.6 oz (73.755 kg) IBW/kg (Calculated) : 45.14  Vital Signs: Temp: 97.7 F (36.5 C) (04/22 0506) Temp src: Oral (04/22 0506) BP: 134/78 mmHg (04/22 0506) Pulse Rate: 80 (04/22 0506)  Labs:  Recent Labs  02/16/13 0430 02/17/13 0529 02/18/13 0504  HGB  --  9.0*  --   HCT  --  27.8*  --   PLT  --  266  --   LABPROT 26.3* 27.3* 29.3*  INR 2.56* 2.69* 2.96*  CREATININE  --  0.83  --     Estimated Creatinine Clearance: 66.8 ml/min (by C-G formula based on Cr of 0.83).  Assessment: 57 yo female s/p partial R-foot amputation and debridement.  INR 2.95 - trending up again. Pt appears sensitve; also on Cipro (Day #5) which may increase Coumadin effect.  No bleeding noted, Hgb down some but essentially stable.  Goal of Therapy:  INR 2-3 Monitor platelets by anticoagulation protocol: Yes   Plan:  1.  No coumadin today 2.  F/U in AM  Sherlon Handing, PharmD, [place] pharmacist, pager [phone] 02/18/2013  2:13 PM

## 2013-02-18 NOTE — Progress Notes (Signed)
Physical Therapy Treatment Patient Details Name: [name] [name] MRN: [id number] DOB: 1956-07-12 Today's Date: 02/18/2013 Time: 9735-3299 PT Time Calculation (min): 30 min  PT Assessment / Plan / Recommendation Comments on Treatment Session  Ptsteadily improving.  She is generally steady transfering and amb. short distance between surfaces.  Practiced with the Knee walker with success.    Follow Up Recommendations  CIR     Does the patient have the potential to tolerate intense rehabilitation     Barriers to Discharge        Equipment Recommendations  Rolling walker with 5" wheels;Wheelchair (measurements PT);Wheelchair cushion (measurements PT);Other (comment) (knee walker)    Recommendations for Other Services    Frequency Min 3X/week   Plan Discharge plan remains appropriate;Frequency remains appropriate    Precautions / Restrictions Precautions Precautions: Fall Restrictions RLE Weight Bearing: Non weight bearing Other Position/Activity Restrictions: per Dr. Jess Barters note   Pertinent Vitals/Pain     Mobility  Bed Mobility Bed Mobility: Sit to Supine;Rolling Left;Supine to Sit;Sitting - Scoot to [employer] of Bed Rolling Left: 6: Modified independent (Device/Increase time) Supine to Sit: 6: Modified independent (Device/Increase time) Sitting - Scoot to Edge of Bed: 6: Modified independent (Device/Increase time) Sit to Supine: 6: Modified independent (Device/Increase time) Details for Bed Mobility Assistance: demo good technique to transfer sit to supine  Transfers Transfers: Sit to Stand;Stand to Sit Sit to Stand: 5: Supervision;From bed;From chair/3-in-1;Other (comment) (couch) Stand to Sit: 5: Supervision;With armrests;To bed;To chair/3-in-1;Other (comment) (couch) Stand Pivot Transfers: 5: Supervision Details for Transfer Assistance: mobility becoming steady and safe Ambulation/Gait Ambulation/Gait Assistance: 5: Supervision Ambulation Distance (Feet): 15 Feet (times 2,  10 feet) Assistive device: Rolling walker Ambulation/Gait Assistance Details: swing to  stable, but tires easily Gait Pattern: Step-to pattern General Gait Details: Also practiced on the Knee walker to feel it out and see if she felt stable enough.  Pt felt really good on the knee walker Stairs: No Wheelchair Mobility Wheelchair Mobility: No    Exercises     PT Diagnosis:    PT Problem List:   PT Treatment Interventions:     PT Goals Acute Rehab PT Goals PT Goal Formulation: With patient Time For Goal Achievement: 02/25/13 Potential to Achieve Goals: Good Pt will go Supine/Side to Sit: Independently;with HOB 0 degrees PT Goal: Supine/Side to Sit - Progress: Met Pt will go Sit to Supine/Side: Independently PT Goal: Sit to Supine/Side - Progress: Met Pt will go Sit to Stand: with modified independence PT Goal: Sit to Stand - Progress: Partly met Pt will go Stand to Sit: with modified independence PT Goal: Stand to Sit - Progress: Partly met Pt will Transfer Bed to Chair/Chair to Bed: with modified independence PT Transfer Goal: Bed to Chair/Chair to Bed - Progress: Progressing toward goal Pt will Ambulate: with modified independence;16 - 50 feet PT Goal: Ambulate - Progress: Progressing toward goal  Visit Information  Last PT Received On: 02/18/13 Assistance Needed: +1    Subjective Data  Subjective: Where've you been, I've been moving around alot.   Cognition  Cognition Arousal/Alertness: Awake/alert Behavior During Therapy: WFL for tasks assessed/performed Overall Cognitive Status: Within Functional Limits for tasks assessed    Balance  Static Standing Balance Static Standing - Balance Support: During functional activity;Bilateral upper extremity supported Static Standing - Level of Assistance: 5: Stand by assistance  End of Session PT - End of Session Activity Tolerance: Patient tolerated treatment well Patient left: in bed;with call bell/phone within reach Nurse  Communication:  Mobility status   GP     Draper Gallon, Tessie Fass 02/18/2013, 4:08 PM 02/18/2013  Donnella Sham, PT [phone] (pager)

## 2013-02-19 ENCOUNTER — Telehealth: Payer: Self-pay | Admitting: Family Medicine

## 2013-02-19 LAB — GLUCOSE, CAPILLARY
Glucose-Capillary: 85 mg/dL (ref 70–99)
Glucose-Capillary: 132 mg/dL — ABNORMAL HIGH (ref 70–99)

## 2013-02-19 MED ORDER — WARFARIN SODIUM 2.5 MG PO TABS
2.5000 mg | ORAL_TABLET | Freq: Once | ORAL | Status: AC
  Administered 2013-02-19: 2.5 mg via ORAL
  Filled 2013-02-19: qty 1

## 2013-02-19 NOTE — Progress Notes (Signed)
Occupational Therapy Treatment Patient Details Name: [name] [name] MRN: [id number] DOB: 1955-12-21 Today's Date: 02/19/2013 Time: 7121-9758 OT Time Calculation (min): 50 min  OT Assessment / Plan / Recommendation Comments on Treatment Session Patient at mod I level with basic ADLs/ADL transfers in hospital room at this time.    Follow Up Recommendations  Home health OT;Supervision - Intermittent    Equipment Recommendations  Hospital bed (w/c, ramp, walker, ?shower chair)    Frequency Min 2X/week   Plan Discharge plan needs to be updated    Precautions / Restrictions Precautions Precautions: Fall Restrictions Weight Bearing Restrictions: Yes RLE Weight Bearing: Non weight bearing   Pertinent Vitals/Pain     ADL  Lower Body Dressing: Performed;Modified independent Where Assessed - Lower Body Dressing: Unsupported sitting Transfers/Ambulation Related to ADLs: Practiced higher level activities within room on RW with NWB RLE including: moving things along countertops and passing items to simulate meal preparation in kitchen, retrieving items from drawers and cabinets, safety during reaching and getting close to objects so she does not reach out of her BOS, having husband there for showering at this time. ADL Comments: Patient remains anxious re discharge plans especially the thought of going home, but she began trying to problem solve activities and things that would need to be put into place in order for her to go home.       OT Goals ADL Goals ADL Goal: Grooming - Progress: Met ADL Goal: Lower Body Bathing - Progress: Met ADL Goal: Lower Body Dressing - Progress: Met ADL Goal: Toilet Transfer - Progress: Met ADL Goal: Toileting - Clothing Manipulation - Progress: Met ADL Goal: Toileting - Hygiene - Progress: Met ADL Goal: Tub/Shower Transfer - Progress: Met ADL Goal: Additional Goal #1 - Progress: Met  Visit Information  Last OT Received On: 02/19/13 Assistance Needed:  +1 PT/OT Co-Evaluation/Treatment: Yes    Cognition  Cognition Arousal/Alertness: Awake/alert Behavior During Therapy: WFL for tasks assessed/performed Overall Cognitive Status: Within Functional Limits for tasks assessed    Mobility  Transfers Transfers: Sit to Stand;Stand to Sit Sit to Stand: 6: Modified independent (Device/Increase time) Stand to Sit: 6: Modified independent (Device/Increase time) Details for Transfer Assistance: pt demonstrated safe techniques for transfers and ability to maintain NWB throughout session    End of Session OT - End of Session Activity Tolerance: Patient tolerated treatment well Patient left: in chair;with call bell/phone within reach  GO     Jafar Poffenberger A 02/19/2013, 11:54 AM

## 2013-02-19 NOTE — Progress Notes (Signed)
Rehab admissions - I spoke with patient.  Noted patient doing better with therapies.  I think we should proceed with home with [facility] therapies.  I still have no determination from [facility] about inpatient rehab.  She is now getting to the point where she will not qualify for an acute inpatient rehab stay due to her progress.  Therefore, I think we should try to discharge home with [facility] therapies.  Call me for questions.  #[phone]

## 2013-02-19 NOTE — Clinical Social Work Note (Signed)
Clinical Social Worker reviewed chart and noticed that patient had progressed with therapies and is appropriate for home with home health services. Insurance approval would still be needed for SNF placement. CSW will sign off, as social work intervention is no longer needed.   Leandro Reasoner MSW, [place]

## 2013-02-19 NOTE — Progress Notes (Signed)
Physical Therapy Treatment Patient Details Name: [name] [name] MRN: [id number] DOB: Jan 27, 1956 Today's Date: 02/19/2013 Time: 8676-7209 PT Time Calculation (min): 25 min  PT Assessment / Plan / Recommendation Comments on Treatment Session  Discussed the need for pt to start looking toward going home with insurance likely going to deny a rehab stay of any kind.  Practiced stairs today just to allow pt to understand that she can do it.    Follow Up Recommendations  Home health PT;Supervision - Intermittent     Does the patient have the potential to tolerate intense rehabilitation     Barriers to Discharge        Equipment Recommendations  Rolling walker with 5" wheels;Wheelchair (measurements PT);Wheelchair cushion (measurements PT);Hospital bed;Other (comment) (3 in 1, ramp to enter home)    Recommendations for Other Services    Frequency Min 3X/week   Plan Discharge plan needs to be updated;Frequency remains appropriate    Precautions / Restrictions Precautions Precautions: Fall Restrictions Weight Bearing Restrictions: Yes RLE Weight Bearing: Non weight bearing Other Position/Activity Restrictions: per Dr. Jess Barters note   Pertinent Vitals/Pain     Mobility  Transfers Transfers: Sit to Stand;Stand to Sit Sit to Stand: 6: Modified independent (Device/Increase time) Stand to Sit: 6: Modified independent (Device/Increase time) Details for Transfer Assistance: pt demonstrated safe techniques for transfers and ability to maintain NWB throughout session Ambulation/Gait Ambulation/Gait Assistance: 6: Modified independent (Device/Increase time) Ambulation Distance (Feet): 10 Feet (to 15 feet at a time) Assistive device: Rolling walker Ambulation/Gait Assistance Details: stable swing to gait pattern Gait Pattern:  (swing to gait) Stairs: Yes Stairs Assistance: 1: +2 Total assist;Patient percentage (comment);Other (comment) (pt 60-70%) Stairs Assistance Details (indicate cue type  and reason): pt placed her arms over shoulders of 2 caregivers and was assisted up/down 4 steps  Stair Management Technique: Step to pattern;Forwards;Other (comment) (2 person assist arms over shoulders) Number of Stairs: 4 Wheelchair Mobility Wheelchair Mobility: No    Exercises     PT Diagnosis:    PT Problem List:   PT Treatment Interventions:     PT Goals Acute Rehab PT Goals Time For Goal Achievement: 02/25/13 Potential to Achieve Goals: Good Pt will go Supine/Side to Sit: Independently;with HOB 0 degrees PT Goal: Supine/Side to Sit - Progress: Met Pt will go Sit to Supine/Side: Independently PT Goal: Sit to Supine/Side - Progress: Met Pt will go Sit to Stand: with modified independence PT Goal: Sit to Stand - Progress: Met Pt will go Stand to Sit: with modified independence PT Goal: Stand to Sit - Progress: Met Pt will Transfer Bed to Chair/Chair to Bed: with modified independence PT Transfer Goal: Bed to Chair/Chair to Bed - Progress: Met Pt will Ambulate: with modified independence;16 - 50 feet PT Goal: Ambulate - Progress: Met  Visit Information  Last PT Received On: 02/19/13 Assistance Needed: +1    Subjective Data  Patient Stated Goal: home   Cognition  Cognition Arousal/Alertness: Awake/alert Behavior During Therapy: WFL for tasks assessed/performed Overall Cognitive Status: Within Functional Limits for tasks assessed    Balance     End of Session PT - End of Session Activity Tolerance: Patient tolerated treatment well Patient left: in chair;with call bell/phone within reach Nurse Communication: Mobility status   GP     Donnavin Vandenbrink, Tessie Fass 02/19/2013, 12:34 PM 02/19/2013  Donnella Sham, PT [phone] (pager)

## 2013-02-19 NOTE — Progress Notes (Addendum)
[facility] PROGRESS NOTE  [name] [name]:[id number] DOB: 25-Jan-1956 DOA: 02/03/2013 PCP: Garnet Koyanagi, DO  Brief narrative: 57 year old female admitted on  02/03/2013 with right foot pain. With concern for diabetic foot ulcer she was placed on antibiotics without improvement . No blood cultures when drawn prior to vancomycin initiation. Emergently Dopplers were performed which ruled out DVT and orthopedics was consulted. She had significant hypokalemia and hyponatremia which was resolved. Orthopedics consulted and had debridement of right foot followed by 5th ray amputation.     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  -continue Vicodin-Increased to 2 tabs every 4 hours prn.  -clinically improved. initially planned on SNF vs CIR as per PT evaluation but hospital stay prolonged due to insurance approval. She is doing much better and per PT/OT reevaluation today, stable to be discharged home  with [facility].  Uncontrolled type 2 diabetes  . Continue home Lantus. Hemoglobin A1c 12.1 (down from 14) sugesting blood sugars have been uncontrolled at home -CBG's now within 109-220 range.  -continue SSI and lantus 40 units . Also on short acting insulin glulisine 10 units bid. Recommend to use resistant sliding scale at home. 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  Replenished   Dehydration 2/2 to hyperosmolar nont-ketotic state  -Improved after IVF's.   Anemia  Likely due to chronic disease.  H&H currently stable  Hypertension   Stable.   Hyperlipidemia  Stable. Continue lipitor   Anxiety/depression:   buspa 5 tid/cymbalta 60/ Lexapro 10 daily   Prophylaxis  coumdin for DVT prophylaxis. 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   Antibiotics:  Vancomycin 02/03/13--02/11/13  primaxin 02/03/13--02/11/13  Doxycycline 02/12/13 until 5/5)   HPI/Subjective: No overnight issues  Objective: Filed Vitals:   02/18/13 0506 02/18/13 1420 02/18/13 2136 02/19/13 0602  BP: 134/78 111/74 131/63 126/62  Pulse: 80 85 91 84  Temp: 97.7 F (36.5 C) 97.8 F (36.6 C) 98.4 F (36.9 C) 98.1 F (36.7 C)  TempSrc: Oral Oral Oral Oral  Resp: 18 18 16 16   Height:      Weight:    76.6 kg (168 lb 14 oz)  SpO2: 100% 99% 100% 100%    Intake/Output Summary (Last 24 hours) at 02/19/13 1303 Last data filed at 02/18/13 1900  Gross per 24 hour  Intake    240 ml  Output      0 ml  Net    240 ml   Filed Weights   02/14/13 0510 02/17/13 0536 02/19/13 0602  Weight: 73.8 kg (162 lb 11.2 oz) 73.755 kg (162 lb 9.6 oz) 76.6 kg (168 lb 14 oz)    Exam:   General:  NAD  HEENT: no pallor, moist oral mucosa  Cardiovascular: NS1&S2, no murmurs  Respiratory: clear b/l, no added sounds  Abdomen: soft, NT, ND, BS+  Musculoskeletal: clean dressing over right 5th ray amputation.  CNS: AAOX3  Data Reviewed: Basic Metabolic Panel:  Recent Labs Lab 02/13/13  0615 02/17/13 0529  NA 139 140  K 3.8 3.6  CL 103 106  CO2 32 29  GLUCOSE 170* 111*  BUN 10 14  CREATININE 0.85 0.83  CALCIUM 9.0 8.5   Liver Function Tests: No results found for this basename: AST, ALT, ALKPHOS, BILITOT, PROT, ALBUMIN,  in the last 168 hours No results found for this basename: LIPASE, AMYLASE,  in the last 168 hours No results found for this basename: AMMONIA,  in the last 168 hours CBC:  Recent Labs Lab  02/13/13 0615 02/14/13 0605 02/15/13 0449 02/17/13 0529  WBC 9.4 8.3 7.9 7.5  HGB 9.6* 9.5* 9.4* 9.0*  HCT 29.3* 29.4* 29.1* 27.8*  MCV 78.8 79.5 79.7 79.2  PLT 263 277 291 266   Cardiac Enzymes: No results found for this basename: CKTOTAL, CKMB, CKMBINDEX, TROPONINI,  in the last 168 hours BNP (last 3 results) No results found for this basename: PROBNP,  in the last 8760 hours CBG:  Recent Labs Lab 02/18/13 1129 02/18/13 1726 02/18/13 2136 02/19/13 0806 02/19/13 1200  GLUCAP 122* 114* 113* 85 171*    Recent Results (from the past 240 hour(s))  SURGICAL PCR SCREEN     Status: Abnormal   Collection Time    02/10/13  9:54 AM      Result Value Range Status   MRSA, PCR NEGATIVE  NEGATIVE Final   Staphylococcus aureus POSITIVE (*) NEGATIVE Final   Comment:            The Xpert SA Assay (FDA     approved for NASAL specimens     in patients over 72 years of age),     is one component of     a comprehensive surveillance     program.  Test performance has     been validated by [employer] for patients greater     than or equal to 90 year old.     It is not intended     to diagnose infection nor to     guide or monitor treatment.  ANAEROBIC CULTURE     Status: None   Collection Time    02/10/13  7:01 PM      Result Value Range Status   Specimen Description ABSCESS FOOT RIGHT   Final   Special Requests NONE   Final   Gram Stain     Final   Value: FEW WBC PRESENT,BOTH PMN AND MONONUCLEAR     NO SQUAMOUS EPITHELIAL CELLS SEEN     FEW GRAM POSITIVE COCCI     IN PAIRS   Culture NO ANAEROBES ISOLATED   Final   Report Status 02/15/2013 FINAL   Final  CULTURE, ROUTINE-ABSCESS     Status: None   Collection Time    02/10/13  7:01 PM      Result Value Range Status   Specimen Description ABSCESS FOOT RIGHT   Final   Special Requests NONE   Final   Gram Stain     Final   Value: FEW WBC PRESENT,BOTH PMN AND MONONUCLEAR     NO SQUAMOUS EPITHELIAL CELLS SEEN     FEW  GRAM POSITIVE COCCI     IN PAIRS   Culture     Final   Value: MODERATE STAPHYLOCOCCUS AUREUS     Note: RIFAMPIN AND GENTAMICIN SHOULD NOT BE USED AS SINGLE DRUGS FOR TREATMENT OF STAPH INFECTIONS.     FEW ENTEROBACTER CLOACAE   Report Status 02/14/2013 FINAL  Final   Organism ID, Bacteria STAPHYLOCOCCUS AUREUS   Final   Organism ID, Bacteria ENTEROBACTER CLOACAE   Final     Studies: No results found.  Scheduled Meds: . atorvastatin  10 mg Oral q1800  . busPIRone  5 mg Oral TID  . calcium-vitamin D  1 tablet Oral Daily  . cholecalciferol  2,000 Units Oral Daily  . ciprofloxacin  500 mg Oral BID  . doxycycline  100 mg Oral Q12H  . DULoxetine  60 mg Oral Daily  . escitalopram  10 mg Oral Daily  . gabapentin  400 mg Oral TID  . insulin aspart  0-20 Units Subcutaneous TID WC  . insulin aspart  0-5 Units Subcutaneous QHS  . insulin aspart  10 Units Subcutaneous TID WC  . insulin glargine  45 Units Subcutaneous QHS  . metaxalone  800 mg Oral TID  . multivitamin with minerals  1 tablet Oral Daily  . polyethylene glycol  17 g Oral Daily  . pregabalin  50 mg Oral BID  . vitamin C  500 mg Oral Daily  . warfarin  2.5 mg Oral ONCE-1800  . Warfarin - Pharmacist Dosing Inpatient   Does not apply q1800   Continuous Infusions:     Time spent: [place], [place]  [facility] Pager [phone] If 7PM-7AM, please contact night-coverage at [email or web address], password [facility] 02/19/2013, 1:03 PM  LOS: 16 days       Discussed with patient about plan on d/c home with [facility] as insurance approval for CIR vs SNF is taking a long time. She informs me that she lives on a  2 nd floor and her husband works 16 hrs a day and is not at home all day. She informs me that she does not get any help during the day. i have informed the CM about this to address.

## 2013-02-19 NOTE — Telephone Encounter (Deleted)
Pt called

## 2013-02-19 NOTE — Progress Notes (Signed)
ANTICOAGULATION CONSULT NOTE - Follow Up Consult  Pharmacy Consult for Coumadin Indication: VTE prophylaxis  Allergies  Allergen Reactions  . Fish-Derived Products Hives, Shortness Of Breath, Swelling and Rash    Hives get in throat causing trouble breathing  . Mushroom Extract Complex Anaphylaxis  . Penicillins Anaphylaxis  . Tomato Hives and Shortness Of Breath    Hives in throat causes her trouble breathing    Patient Measurements: Height: 4' 11.84" (152 cm) Weight: 168 lb 14 oz (76.6 kg) IBW/kg (Calculated) : 45.14  Vital Signs: Temp: 98.1 F (36.7 C) (04/23 0602) Temp src: Oral (04/23 0602) BP: 126/62 mmHg (04/23 0602) Pulse Rate: 84 (04/23 0602)  Labs:  Recent Labs  02/17/13 0529 02/18/13 0504 02/19/13 0620  HGB 9.0*  --   --   HCT 27.8*  --   --   PLT 266  --   --   LABPROT 27.3* 29.3* 24.4*  INR 2.69* 2.96* 2.32*  CREATININE 0.83  --   --     Estimated Creatinine Clearance: 68.1 ml/min (by C-G formula based on Cr of 0.83).  Assessment: 57 yo female s/p partial R-foot amputation and debridement.  INR 2.32, downward trend. Pt appears sensitve; also on Cipro & doxy (Day #5) for foot abscess, which may increase Coumadin effect.  No bleeding noted, Hgb down some but essentially stable as of 4/21.  Noted pharmacist completed Coumadin education and CHL documentation on 4/21.  Goal of Therapy:  INR 2-3 Monitor platelets by anticoagulation protocol: Yes   Plan:  - Attempt Coumadin 2.74m today (based on avg over the last 7 days), if INR stabilizes, would use this as maintenance dose. - Will continue to f/up daily INR. - Noted anticipated d/c to SNF/CIR pending.  Thanks, Bea Duren K. PPosey Pronto PharmD, BCPS.  Clinical Pharmacist Pager [phone] 02/19/2013 10:11 AM

## 2013-02-19 NOTE — Progress Notes (Signed)
Rehab admissions - I spoke with [employer] yesterday and they tell me that they are very far behind because of the holiday.  I have called and faxed updates daily since last Thursday requesting acute inpatient rehab.  I have not been able to get a response from any case manager at [facility].  I recommend pursuit of other appropriate discharge plans.  If I hear from [facility], I will let you know.  Call me for questions.  #[phone]

## 2013-02-20 LAB — GLUCOSE, CAPILLARY: Glucose-Capillary: 134 mg/dL — ABNORMAL HIGH (ref 70–99)

## 2013-02-20 LAB — PROTIME-INR: INR: 2.51 — ABNORMAL HIGH (ref 0.00–1.49)

## 2013-02-20 MED ORDER — INSULIN GLULISINE 100 UNIT/ML ~~LOC~~ SOLN: 10.0000 [IU] | Freq: Two times a day (BID) | SUBCUTANEOUS | Status: DC

## 2013-02-20 MED ORDER — WARFARIN SODIUM 2.5 MG PO TABS: 2.5000 mg | ORAL_TABLET | Freq: Every day | ORAL | Status: DC

## 2013-02-20 MED ORDER — INSULIN ASPART 100 UNIT/ML ~~LOC~~ SOLN
5.0000 [IU] | Freq: Three times a day (TID) | SUBCUTANEOUS | Status: DC
  Administered 2013-02-20: 5 [IU] via SUBCUTANEOUS

## 2013-02-20 MED ORDER — CIPROFLOXACIN HCL 500 MG PO TABS: 500.0000 mg | ORAL_TABLET | Freq: Two times a day (BID) | ORAL | Status: DC

## 2013-02-20 MED ORDER — CIPROFLOXACIN HCL 500 MG PO TABS: 250.0000 mg | ORAL_TABLET | Freq: Two times a day (BID) | ORAL | Status: DC

## 2013-02-20 MED ORDER — DOXYCYCLINE HYCLATE 100 MG PO TABS: 100.0000 mg | ORAL_TABLET | Freq: Two times a day (BID) | ORAL | Status: AC

## 2013-02-20 MED ORDER — CIPROFLOXACIN HCL 500 MG PO TABS: 500.0000 mg | ORAL_TABLET | Freq: Two times a day (BID) | ORAL | Status: AC

## 2013-02-20 MED ORDER — INSULIN GLARGINE 100 UNIT/ML ~~LOC~~ SOLN
40.0000 [IU] | Freq: Every day | SUBCUTANEOUS | Status: DC
  Filled 2013-02-20: qty 0.4

## 2013-02-20 NOTE — Discharge Summary (Signed)
Physician Discharge Summary  [name] [name] KYH:[id number] DOB: 06/02/1956 DOA: 02/03/2013  PCP: Garnet Koyanagi, DO  Admit date: 02/03/2013 Discharge date: 02/20/2013  Time spent: 40 minutes  Recommendations for Outpatient Follow-up:  1. Home with home health PT/ OT and RN for dressing 2. Recommend NWB over right leg 3. Follow up with PCP tomorrow . Needs INR monitoring for 2 weeks while on coumadin ( for 2 more weeks). 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 With concern for diabetic foot ulcer she was placed on antibiotics without improvement . No blood cultures when drawn prior to vancomycin initiation. Emergently Dopplers were performed which ruled out DVT and orthopedics was consulted. She had significant hypokalemia and hyponatremia which was resolved. Orthopedics consulted and had debridement of right foot followed by 5th ray amputation.    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 -continue doxycycline to complete 4 wk course on 03/03/13. Added Cipro 500 bid on 4/18 to coverage for enteorbacter  -continue Vicodin  Prn for pain.  -clinically improved. initially planned on SNF vs CIR as per PT evaluation but hospital stay prolonged due to insurance approval. She is doing much better and per PT/OT reevaluation, stable to be discharged home with [facility].  Recommend NWB over right leg. Will follow up with Dr Sharol Given in 1 week. i have discussed plan with him today.  Uncontrolled type 2 diabetes  . Hemoglobin A1c 12.1 (down from 14) sugesting blood sugars have been uncontrolled at home  Patient had an episode  of low fsg of 60 this morning. Was getting 45 units of lantus at bedtime and 10 units tid of aspart with meals. Will cut down  to her home dose on discharge .(- lantus 40 units qhs and  short acting insulin glulisine 10 units bid) . Recommend to use resistant sliding scale at home.  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   Hypokalemia and hyponatremia  -Likely due to uncontrolled diabetes.  Replenished   Dehydration 2/2 to hyperosmolar nont-ketotic state  -Improved after hydration  Anemia  Likely due to chronic disease.  H&H currently stable   Hypertension  Stable.   Hyperlipidemia  Stable. Continue lipitor   Anxiety/depression:  continue home meds   DVT Prophylaxis  coumdin for DVT prophylaxis. Has received for about 10 days here. Continue for another 2 weeks. INR therapeutic. Will follow with her PCP.  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    Antibiotics:  Vancomycin 02/03/13--02/11/13  primaxin 02/03/13--02/11/13  Doxycycline 02/12/13 until 5/5)  Ciprofloxacin 4/18--4/24)  Discharge Exam: Filed Vitals:   02/19/13 0602 02/19/13 1428 02/19/13 2155  02/20/13 0646  BP: 126/62 119/66 132/68 123/71  Pulse: 84 84 83 78  Temp: 98.1 F (36.7 C) 97.9 F (36.6 C) 97.9 F (36.6 C) 97.8 F (36.6 C)  TempSrc: Oral Oral Oral Oral  Resp: 16 16 16 17   Height:      Weight: 76.6 kg (168 lb 14 oz)   76 kg (167 lb 8.8 oz)  SpO2: 100% 100% 100% 97%    General: NAD  HEENT: no pallor, moist oral mucosa  Cardiovascular: NS1&S2, no murmurs  Respiratory: clear b/l, no added sounds  Abdomen: soft, NT, ND, BS+  Musculoskeletal: clean dressing over right 5th ray amputation.  CNS: AAOX3  Discharge Instructions  Discharge Orders   Future Appointments Provider Department Dept Phone   02/21/2013 1:30 PM Rosalita Chessman, [place] at  [place]   Future Orders Complete By Expires     Ambulatory referral to Nutrition and Diabetic Education  As directed     Comments:      Will need 1:1.  Patient has type 2 diabetes.  She is on basal and meal time insulin.    Diet - low sodium heart healthy  As directed     Elevate operative extremity  As directed     Increase activity slowly  As directed     Non weight bearing  As directed     Comments:      Nonweightbearing right foot.        Medication List    STOP taking these medications       HYDROcodone-ibuprofen 7.5-200 MG per tablet  Commonly known as:  VICOPROFEN      TAKE these medications       atorvastatin 10 MG tablet  Commonly known as:  LIPITOR  Take 1 tablet (10 mg total) by mouth daily.     busPIRone 5 MG tablet  Commonly known as:  BUSPAR  Take 1 tablet (5 mg total) by mouth 3 (three) times daily.     CALTRATE 600+D 600-400 MG-UNIT per tablet  Generic drug:  Calcium Carbonate-Vitamin D  Take 1 tablet by mouth daily.     Cinnamon 500 MG Tabs  Take 1 tablet by mouth daily.     ciprofloxacin 500 MG tablet  Commonly known as:  CIPRO  Take 1 tablet (500 mg total) by mouth 2 (two) times daily. Until 03/03/14     doxycycline 100 MG tablet  Commonly  known as:  VIBRA-TABS  Take 1 tablet (100 mg total) by mouth every 12 (twelve) hours.Until 03/03/13     DULoxetine 60 MG capsule  Commonly known as:  CYMBALTA  Take 1 capsule (60 mg total) by mouth daily.     escitalopram 10 MG tablet  Commonly known as:  LEXAPRO  Take 10 mg by mouth daily.     gabapentin 300 MG capsule  Commonly known as:  NEURONTIN  Take 2 capsules (600 mg total) by mouth 3 (three) times daily.     hydrochlorothiazide 25 MG tablet  Commonly known as:  HYDRODIURIL  Take 1 tablet (25 mg total) by mouth daily.     HYDROcodone-acetaminophen  5-325 MG per tablet  Commonly known as:  NORCO/VICODIN  Take 2 tablets by mouth every 4 (four) hours as needed.     insulin glargine 100 UNIT/ML injection  Commonly known as:  LANTUS SOLOSTAR  Inject 40 Units into the skin at bedtime.     insulin glulisine 100 UNIT/ML injection  Commonly known as:  APIDRA SOLOSTAR  Inject 10 Units into the skin 2 (two) times daily. 10 units at breakfast and 10 units at dinner     metaxalone 800 MG tablet  Commonly known as:  SKELAXIN  Take 1 tablet (800 mg total) by mouth 3 (three) times daily.      multivitamin tablet  Take 1 tablet by mouth daily.     polyethylene glycol packet  Commonly known as:  MIRALAX / GLYCOLAX  Take 17 g by mouth daily.     vitamin C 500 MG tablet  Commonly known as:  ASCORBIC ACID  Take 500 mg by mouth daily.     Vitamin D 2000 UNITS tablet  Take 2,000 Units by mouth daily.     warfarin 2.5 MG tablet  Commonly known as:  COUMADIN  Take 1 tablet (2.5 mg total) by mouth daily. ( for next 2 weeks only)           Follow-up Information   Follow up with DUDA,MARCUS V, MD. Call in 1 week.   Contact information:   [address] [place] [address] [phone]       Follow up with Garnet Koyanagi, DO On 02/21/2013. (1130 am)    Contact information:   [address] [facility] [place] [address] [phone]         The results of significant diagnostics from this hospitalization (including imaging, microbiology, ancillary and laboratory) are listed below for reference.    Significant Diagnostic Studies: Dg Chest 2 View  02/03/2013  *RADIOLOGY REPORT*  Clinical Data: Fall with left-sided rib pain.  CHEST - 2 VIEW  Comparison: 10/15/2012  Findings: Chest x-ray shows mild bibasilar atelectasis.  No pneumothorax, focal consolidation or pleural fluid is identified. The heart size is normal.  Visualized bony structures show no obvious acute rib fractures.  There is chronic deformity of the proximal left humerus related to prior fracture.  IMPRESSION: Mild bibasilar atelectasis.   Original Report Authenticated By: Aletta Edouard, M.D.    Dg Hip Complete Right  02/05/2013  *RADIOLOGY REPORT*  Clinical Data: Right hip pain after fall  RIGHT HIP - COMPLETE 2+ VIEW  Comparison: None.  Findings: No fracture or dislocation is noted.  No degenerative changes are noted.  IMPRESSION: Normal right hip.   Original Report Authenticated By: Marijo Conception.,  M.D.    Dg Hip Complete Right  02/03/2013  *RADIOLOGY REPORT*  Clinical Data: Fall with right hip pain.  RIGHT HIP - COMPLETE 2+ VIEW  Comparison:  None.  Findings:  There is no evidence of hip fracture or dislocation. There is no evidence of arthropathy or other focal bone abnormality. The bony pelvis is intact.  IMPRESSION: Negative.   Original Report Authenticated By: Aletta Edouard, M.D.    Ct Abdomen Pelvis W Contrast  02/03/2013  *RADIOLOGY REPORT*  Clinical Data: Fall 5 days ago.  Right lower quadrant pain. Nausea.  History of "liver cancer."  Diabetes.  Hypertension.  CT ABDOMEN AND PELVIS WITH CONTRAST  Technique:  Multidetector CT imaging of the abdomen and pelvis was performed following the standard protocol during bolus administration  of intravenous contrast.  Contrast: 1 OMNIPAQUE IOHEXOL 300 MG/ML  SOLN, 152m OMNIPAQUE IOHEXOL 300 MG/ML  SOLN  Comparison:  07/06/2010  Findings: Lung bases:  Clear lung bases.  Mild cardiomegaly.  A tiny hiatal hernia. Prominent right cardiophrenic angle node on image 10/series 2 is chronic and reactive.  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  Prominent left portal vein on image 18/series 2, similar to 06/04/2007.  Persistent splenomegaly, with the spleen measuring 13.7 cm cranial caudal.  Normal stomach, pancreas. Cholecystectomy without biliary ductal dilatation.  Normal adrenal glands.  Bilateral heterogeneous renal enhancement, especially on the delayed series seven.  Mildly prominent retroperitoneal nodes which are likely reactive and are unchanged.  Colonic stool burden suggests constipation.  Normal terminal ileum and appendix.  No pneumatosis or free intraperitoneal air.  Normal small bowel without abdominal ascites.  1.3 cm right external iliac node on image 84/series 2 is new since 07/06/2010.  There are also prominent right inguinal nodes which are newly enlarged.  Normal urinary bladder.  Hysterectomy. No significant free fluid.  Bones/Musculoskeletal:  No acute osseous abnormality.  IMPRESSION:  1.  No acute post-traumatic deformity identified. Heterogeneous renal enhancement is suspicious for bilateral pyelonephritis. 2.  Moderate cirrhosis with persistent splenomegaly. 3. Possible constipation. 4.  Normal appendix. 5.  Mild adenopathy in the right external iliac station with prominent right inguinal nodes.  Favored to be reactive.  Not in the typical drainage pattern for liver cancer metastasis.  Consider physical exam correlation.   Original Report Authenticated By: KAbigail Miyamoto M.D.    Mr Foot Right W Wo Contrast  02/05/2013  *RADIOLOGY REPORT*  Clinical Data: Dorsal foot ulcer.  History of diabetes.  MRI OF THE RIGHT FOREFOOT WITHOUT AND WITH CONTRAST  Technique:  Multiplanar, multisequence MR imaging was performed both before and after  administration of intravenous contrast.  Contrast: 175mMULTIHANCE GADOBENATE DIMEGLUMINE 529 MG/ML IV SOLN  Comparison: Radiographs 02/03/2013.  Findings: There is diffuse forefoot soft tissue edema.  There are several ill-defined superficial fluid collections dorsal to the bases of the fourth and fifth metatarsals.  These are best seen on the T2 and postcontrast T1-weighted images.  The proximal extent is to the level of the cuboid and the distal extent to the mid metatarsals. Greatest extent is approximately 4.4 cm.  These fluid collections are primarily superficial to the extensor tendons, although mild tendon sheath involving cannot be excluded.  No plantar fluid collections are identified.  The flexor tendons and foot musculature appear normal.  There is no evidence of bone destruction, fracture or subluxation.  IMPRESSION:  1.  Dorsal forefoot soft tissue abscesses as described.  There is possible associated extensor tenosynovitis. 2.  No evidence of osteomyelitis.   Original Report Authenticated By: WiRichardean SaleM.D.    Dg Foot Complete Right  02/03/2013  *RADIOLOGY REPORT*  Clinical Data: Diabetic foot.  Redness and swelling.  Lateral sore on bottom of foot at  head of first metatarsal.  RIGHT FOOT COMPLETE - 3+ VIEW  Comparison: None.  Findings: Minimal hallux valgus deformity.  There may be soft tissue swelling about the medial aspect of the first metatarsal phalangeal joint.  No osseous destruction.  Achilles and calcaneal spurs.  Probable dorsal forefoot soft tissue swelling as well.  No soft tissue gas or radiopaque foreign object.  Possible skin/soft tissue ulcer about the fifth metatarsal phalangeal joint, most apparent on the oblique view.  IMPRESSION: No acute osseous abnormality.  Possible soft tissue defect about the lateral aspect of the fifth MTP joint.  Relatively diffuse soft tissue swelling about the forefoot.   Original Report Authenticated By: Abigail Miyamoto, M.D.      Microbiology: Recent Results (from the past 240 hour(s))  ANAEROBIC CULTURE     Status: None   Collection Time    02/10/13  7:01 PM      Result Value Range Status   Specimen Description ABSCESS FOOT RIGHT   Final   Special Requests NONE   Final   Gram Stain     Final   Value: FEW WBC PRESENT,BOTH PMN AND MONONUCLEAR     NO SQUAMOUS EPITHELIAL CELLS SEEN     FEW GRAM POSITIVE COCCI     IN PAIRS   Culture NO ANAEROBES ISOLATED   Final   Report Status 02/15/2013 FINAL   Final  CULTURE, ROUTINE-ABSCESS     Status: None   Collection Time    02/10/13  7:01 PM      Result Value Range Status   Specimen Description ABSCESS FOOT RIGHT   Final   Special Requests NONE   Final   Gram Stain     Final   Value: FEW WBC PRESENT,BOTH PMN AND MONONUCLEAR     NO SQUAMOUS EPITHELIAL CELLS SEEN     FEW GRAM POSITIVE COCCI     IN PAIRS   Culture     Final   Value: MODERATE STAPHYLOCOCCUS AUREUS     Note: RIFAMPIN AND GENTAMICIN SHOULD NOT BE USED AS SINGLE DRUGS FOR TREATMENT OF STAPH INFECTIONS.     FEW ENTEROBACTER CLOACAE   Report Status 02/14/2013 FINAL   Final   Organism ID, Bacteria STAPHYLOCOCCUS AUREUS   Final   Organism ID, Bacteria ENTEROBACTER CLOACAE   Final     Labs: Basic Metabolic Panel:  Recent Labs Lab 02/17/13 0529  NA 140  K 3.6  CL 106  CO2 29  GLUCOSE 111*  BUN 14  CREATININE 0.83  CALCIUM 8.5   Liver Function Tests: No results found for this basename: AST, ALT, ALKPHOS, BILITOT, PROT, ALBUMIN,  in the last 168 hours No results found for this basename: LIPASE, AMYLASE,  in the last 168 hours No results found for this basename: AMMONIA,  in the last 168 hours CBC:  Recent Labs Lab 02/14/13 0605 02/15/13 0449 02/17/13 0529  WBC 8.3 7.9 7.5  HGB 9.5* 9.4* 9.0*  HCT 29.4* 29.1* 27.8*  MCV 79.5 79.7 79.2  PLT 277 291 266   Cardiac Enzymes: No results found for this basename: CKTOTAL, CKMB, CKMBINDEX, TROPONINI,  in the last 168 hours BNP: BNP (last  3 results) No results found for this basename: PROBNP,  in the last 8760 hours CBG:  Recent Labs Lab 02/19/13 1200 02/19/13 1658 02/19/13 2153 02/20/13 0754 02/20/13 0832  GLUCAP 171* 132* 75 60* 105*       Signed:  Meilin Brosh  [facility] 02/20/2013, 12:04 PM

## 2013-02-20 NOTE — Progress Notes (Signed)
Hypoglycemic Event  CBG: 60  Treatment: 15 GM carbohydrate snack  Symptoms: Shaky, Hungry and Nervous/irritable  Follow-up CBG: [id number] CBG Result:105  Possible Reasons for Event: Medication regimen: Lantus given last pm, could the dose be too much?  Comments/MD notified:Dr. Dhungel notified     Norva Karvonen  Remember to initiate Hypoglycemia Order Set & complete

## 2013-02-21 ENCOUNTER — Ambulatory Visit (INDEPENDENT_AMBULATORY_CARE_PROVIDER_SITE_OTHER): Payer: BC Managed Care – PPO | Admitting: Family Medicine

## 2013-02-21 ENCOUNTER — Encounter: Payer: Self-pay | Admitting: Family Medicine

## 2013-02-21 VITALS — BP 112/66 | HR 89 | Temp 98.4°F

## 2013-02-21 DIAGNOSIS — E118 Type 2 diabetes mellitus with unspecified complications: Secondary | ICD-10-CM

## 2013-02-21 DIAGNOSIS — Z89421 Acquired absence of other right toe(s): Secondary | ICD-10-CM

## 2013-02-21 DIAGNOSIS — S98139A Complete traumatic amputation of one unspecified lesser toe, initial encounter: Secondary | ICD-10-CM

## 2013-02-21 DIAGNOSIS — E1165 Type 2 diabetes mellitus with hyperglycemia: Secondary | ICD-10-CM

## 2013-02-21 DIAGNOSIS — Z89429 Acquired absence of other toe(s), unspecified side: Secondary | ICD-10-CM | POA: Insufficient documentation

## 2013-02-21 DIAGNOSIS — Z7901 Long term (current) use of anticoagulants: Secondary | ICD-10-CM

## 2013-02-21 LAB — POCT INR: INR: 2.1

## 2013-02-21 LAB — CBC WITH DIFFERENTIAL/PLATELET
Basophils Absolute: 0.1 10*3/uL (ref 0.0–0.1)
Basophils Relative: 0.7 % (ref 0.0–3.0)
Hemoglobin: 9.9 g/dL — ABNORMAL LOW (ref 12.0–15.0)
Lymphocytes Relative: 23.5 % (ref 12.0–46.0)
Monocytes Relative: 8.4 % (ref 3.0–12.0)
Neutro Abs: 4.8 10*3/uL (ref 1.4–7.7)
RBC: 3.79 Mil/uL — ABNORMAL LOW (ref 3.87–5.11)
RDW: 14.6 % (ref 11.5–14.6)
WBC: 7.2 10*3/uL (ref 4.5–10.5)

## 2013-02-21 LAB — BASIC METABOLIC PANEL
Calcium: 9 mg/dL (ref 8.4–10.5)
GFR: 65.21 mL/min (ref >60.00)
Sodium: 139 mEq/L (ref 135–145)

## 2013-02-21 NOTE — Progress Notes (Signed)
  Subjective:    [name], female    DOB: [birth year], 57 y.o.   MRN: [id number]  HPI Pt here for hospital f/u.   She had 5th toe on L foot amp with part of her foot---now bandaged.  Pt seeing Dr Sharol Given. Pt needs InR checked-- on coumadin for 6 weeks more.   Check cbcd and bmp as well--- see d/c summary. Pt is using Insulin with sliding scale.  Sugars are better per her.  Endo referral pending.   Review of Systems As above    Objective:   Physical Exam  BP 112/66  Pulse 89  Temp(Src) 98.4 F (36.9 C) (Oral)  SpO2 99% General appearance: alert, cooperative, appears stated age and no distress Lungs: clear to auscultation bilaterally Heart: S1, S2 normal Extremities: L foot bandaged      Assessment & Plan:

## 2013-02-21 NOTE — Patient Instructions (Addendum)

## 2013-02-21 NOTE — Assessment & Plan Note (Signed)
F/u dr duda INR checked today--- rto 2 weeks

## 2013-02-21 NOTE — Assessment & Plan Note (Signed)
Endo referral pending con't insulin and sliding scale

## 2013-02-24 ENCOUNTER — Telehealth: Payer: Self-pay | Admitting: *Deleted

## 2013-02-24 NOTE — Telephone Encounter (Signed)
Pt left VM at 3:50 pm on triage stating that she is still in pain due to surgery on her foot and would like to get a refill on med.Last OV 02-17-13 #30, last OV 02-21-13.Please advise

## 2013-02-24 NOTE — Telephone Encounter (Signed)
She should get it from surgeon

## 2013-02-25 NOTE — Telephone Encounter (Signed)
Spoke with patient and she voiced understanding, she agreed to call the surgeon.     KP

## 2013-02-27 ENCOUNTER — Other Ambulatory Visit: Payer: Self-pay | Admitting: Family Medicine

## 2013-02-27 NOTE — Telephone Encounter (Signed)
Last seen 02/21/13 and filled 02/17/13 #30. Rx given in  the ED we normally give the patient #60. Please advise      KP

## 2013-03-03 ENCOUNTER — Encounter: Payer: Self-pay | Admitting: Endocrinology

## 2013-03-03 ENCOUNTER — Ambulatory Visit (INDEPENDENT_AMBULATORY_CARE_PROVIDER_SITE_OTHER): Payer: BC Managed Care – PPO | Admitting: Endocrinology

## 2013-03-03 VITALS — BP 132/70 | HR 100 | Wt 129.0 lb

## 2013-03-03 DIAGNOSIS — E1165 Type 2 diabetes mellitus with hyperglycemia: Secondary | ICD-10-CM

## 2013-03-03 DIAGNOSIS — E118 Type 2 diabetes mellitus with unspecified complications: Secondary | ICD-10-CM
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
## 2013-03-03 NOTE — Patient Instructions (Addendum)
good diet and exercise habits significanly improve the control of your diabetes.  please let me know if you wish to be referred to a dietician.  high blood sugar is very risky to your health.  you should see an eye doctor every year.  You are at higher than average risk for pneumonia and hepatitis-B.  You should be vaccinated against both.   controlling your blood pressure and cholesterol drastically reduces the damage diabetes does to your body.  this also applies to quitting smoking.  please discuss these with your doctor.  you should take an aspirin every day, unless you have been advised by a doctor not to. check your blood sugar twice a day.  vary the time of day when you check, between before the 3 meals, and at bedtime.  also check if you have symptoms of your blood sugar being too high or too low.  please keep a record of the readings and bring it to your next appointment here.  please call us sooner if your blood sugar goes below 70, or if you have a lot of readings over 200. For now, please reduce the lantus to 30 units at bedtime.   Please come back for a follow-up appointment in 2 weeks.

## 2013-03-06 ENCOUNTER — Encounter: Payer: BC Managed Care – PPO | Attending: Family Medicine | Admitting: Dietician

## 2013-03-06 ENCOUNTER — Encounter: Payer: Self-pay | Admitting: Dietician

## 2013-03-06 VITALS — Ht 60.0 in | Wt 125.0 lb

## 2013-03-06 DIAGNOSIS — E1149 Type 2 diabetes mellitus with other diabetic neurological complication: Secondary | ICD-10-CM

## 2013-03-06 DIAGNOSIS — Z713 Dietary counseling and surveillance: Secondary | ICD-10-CM | POA: Insufficient documentation

## 2013-03-06 DIAGNOSIS — E119 Type 2 diabetes mellitus without complications: Secondary | ICD-10-CM | POA: Insufficient documentation

## 2013-03-06 NOTE — Patient Instructions (Addendum)
   Work with PT to get you a routine that will help with getting the heart rate up and help with getting the blood glucose down.  Ask about an upper body routine.  Protein at all meals and snacks. Make this a lean protein (meat, poultry, light cheeses and nuts).  Protein serving size at meal time is the palm of the hand and at snack 1-2 oz.  Monitor fat serving sizes, plan an added fat serving for each meal and snack.  Aim to use the monounsaturated fats (olive, olive oil, avocado, almonds, peanuts, nut butters).  Plan to use the serving size on the food label.  Try to keep the carb serving to 45 -60 gm per meal.  Aim for 30-45 for meals and use the other 15 gms for your snacks.  Plan to have large servings of the non-starchy vegetables that are lower in carb, high in vitamins and minerals.  Continue regular glucose monitoring.  Add in the times that you have symptoms of lows.  Plan to continue to bake, broil, grill, roast, steam, avoid frying of foods.  Continue to use the food label and the exchange list to tighten your carbs.  Plan to f/u in 12 weeks with [facility].  You can work with her to establish more precise carb counting skills and to get to a point where you are dosing your Aprida using an insulin to Carb ratio with a correction dose.

## 2013-03-06 NOTE — Progress Notes (Signed)
Medical Nutrition Therapy:  Appt start time: 5993 end time:  5701.   Assessment:  Primary concerns today: Learn good nutrition and to get blood glucose under better control.  History of DM for 7 years and using insulin for last 5 years.  Had recent episode of wound infection in the RT foot and was able to follow-through with a lengthy recover and has managed to maintain her foot.  Is adamant that she will keep her extremities.  Anxious to get glucose under better control and lose no more that her little toe.    MEDICATIONS: med review completed.  DM meds: Lantus 40 units at HS.  Apidra 10 units before breakfast and dinner.  BLOOD GLUCOSE: Monitoring 3-4 times per day.  Does not have meter or glucose log.  Notes that they are much better than they were previously.    HYPOGLYCEMIA:  Gives history of S/S of the dizzy, tired, light headed and weak occuring about 2 times per week.  Related to meal delays.  HYPERGLYCEMIA: Having blurred vision at times and will experience periods of extreme hunger.  Encouraged her to check her blood sugar to determine the glucose level at the time and to document in her glucose log.   DIETARY INTAKE:  Usual eating pattern includes 3 meals and 1-2 snacks per day.  Avoided foods include: Pasta, orange juice, and sugars  .    24-hr recall:  B ( AM): 9:00 one cup of cereal (Whole grain Cheerioes,) fruit, and yogurt, and 1 bottle of water and Sprite Zero and a mini muffin, low fat, Or scrambled eggs, with vitamin D milk and Talana Slatten have some bacon.  Snk ( AM): none  L ( PM): 1:00 cottage cheese with peaches or pears with sandwich (1/2) [place] or ham with cheese and onion, with Sprite Zero or Diet soda or lemonade and water. Maybe a few pretzels Snk ( PM): pretzels or a jello (diet) D ( PM): 6:30-7:00 kalbasia with peppers, low fat 4 cheese rice 1/2 cup and carrots 1/3 (cooked with some butter) Snk ( PM): graham crackers. 2-3  Beverages: water, Diet Spite and other Diet  Lemonade.  Usual physical activity: Doing the exercises that PT have prescribed.  Encouraged her to use PT to help in planning for future exercises.  Estimated energy needs: HT: 60 in  WT: 125 lb  BMI: 25.5 Kg/m2   Adj WT: 108 lb (49 kg) 1200-1300 calories 135-140 g carbohydrates 90-95 g protein 32-35 g fat  Progress Towards Goal(s):  In progress.   Nutritional Diagnosis:  [place]-2.1 Inpaired nutrition utilization As related to glucose.  As evidenced by Histroy of type 2 diabetes for 7 years, Recent A1C at 12.6%, insulin use for glucose control, recent foot wound infection..    Intervention:  Nutrition/diabetes  Work with PT to get you a routine that will help with getting the heart rate up and help with getting the blood glucose down.  Ask about an upper body routine.  Protein at all meals and snacks. Make this a lean protein (meat, poultry, light cheeses and nuts).  Protein serving size at meal time is the palm of the hand and at snack 1-2 oz.  Monitor fat serving sizes, plan an added fat serving for each meal and snack.  Aim to use the monounsaturated fats (olive, olive oil, avocado, almonds, peanuts, nut butters).  Plan to use the serving size on the food label.  Try to keep the carb serving to 45 -60 gm per  meal.  Aim for 30-45 for meals and use the other 15 gms for your snacks.  Plan to have large servings of the non-starchy vegetables that are lower in carb, high in vitamins and minerals.  Continue regular glucose monitoring.  Add in the times that you have symptoms of lows.  Plan to continue to bake, broil, grill, roast, steam, avoid frying of foods.  Continue to use the food label and the exchange list to tighten your carbs.  Plan to f/u in 12 weeks with [facility].  You can work with her to establish more precise carb counting skills and to get to a point where you are dosing your Aprida using an insulin to Carb ratio with a correction dose.  Handouts given during  visit include:  Living Well with Diabetes  Yellow Card with Diet Prescription and Exchange List  Novo Nordisk Carb Counting Guide  Controlling Blood Glucose  Monitoring/Evaluation:  Dietary intake, exercise, blood glucose levels, and body weight in 12 weeks with Jeanie Sewer.

## 2013-03-07 NOTE — Consult Note (Signed)
I have seen and examined the patient. I agree with the findings above.  Extensive erythema noted starting on the dorsum of the right foot and extending laterally along the lateral border towards the 5th MTT head.  Extensive skin desquamation is noted  Area of erythema roughly 8cm long x 6 cm wide Purulent ulcer/wouind to dorsum of R foot over 5th MTT Area of necrotic tissue noted along the lateral border of the R foot over the midfoot is noted as well Pressure callus over 1st MTT head as well, plantar and medial border of foot  Recommended BKA and discussed at length many options and their sequelae Spoke with Dr. Sharol Given afterward  Rozanna Box, MD

## 2013-03-12 ENCOUNTER — Encounter (HOSPITAL_COMMUNITY): Payer: Self-pay | Admitting: Pharmacy Technician

## 2013-03-13 ENCOUNTER — Other Ambulatory Visit: Payer: Self-pay | Admitting: Family Medicine

## 2013-03-13 ENCOUNTER — Other Ambulatory Visit (HOSPITAL_COMMUNITY): Payer: Self-pay | Admitting: Orthopedic Surgery

## 2013-03-14 DIAGNOSIS — IMO0001 Reserved for inherently not codable concepts without codable children: Secondary | ICD-10-CM

## 2013-03-14 DIAGNOSIS — Z4789 Encounter for other orthopedic aftercare: Secondary | ICD-10-CM

## 2013-03-14 DIAGNOSIS — E119 Type 2 diabetes mellitus without complications: Secondary | ICD-10-CM

## 2013-03-14 DIAGNOSIS — I10 Essential (primary) hypertension: Secondary | ICD-10-CM

## 2013-03-14 NOTE — Telephone Encounter (Signed)
Will forward to PCP for approval

## 2013-03-14 NOTE — Telephone Encounter (Signed)
Per PCP needs to get it from ortho (see previous phone note)

## 2013-03-14 NOTE — Telephone Encounter (Signed)
Last seen 02/21/13. Please advise     KP

## 2013-03-14 NOTE — Telephone Encounter (Signed)
SKELAXIN last filled 09-20-12 # 30 1. Pt states that she will take her last hydrocodone today.Please advise

## 2013-03-15 ENCOUNTER — Encounter (HOSPITAL_BASED_OUTPATIENT_CLINIC_OR_DEPARTMENT_OTHER): Payer: Self-pay | Admitting: *Deleted

## 2013-03-15 ENCOUNTER — Emergency Department (HOSPITAL_BASED_OUTPATIENT_CLINIC_OR_DEPARTMENT_OTHER)
Admission: EM | Admit: 2013-03-15 | Discharge: 2013-03-15 | Disposition: A | Discharge status: Home / Self Care | Payer: BC Managed Care – PPO | Attending: Emergency Medicine | Admitting: Emergency Medicine

## 2013-03-15 DIAGNOSIS — I252 Old myocardial infarction: Secondary | ICD-10-CM | POA: Insufficient documentation

## 2013-03-15 DIAGNOSIS — S98919A Complete traumatic amputation of unspecified foot, level unspecified, initial encounter: Secondary | ICD-10-CM | POA: Insufficient documentation

## 2013-03-15 DIAGNOSIS — R51 Headache: Secondary | ICD-10-CM | POA: Insufficient documentation

## 2013-03-15 DIAGNOSIS — M79605 Pain in left leg: Secondary | ICD-10-CM

## 2013-03-15 DIAGNOSIS — Z8739 Personal history of other diseases of the musculoskeletal system and connective tissue: Secondary | ICD-10-CM | POA: Insufficient documentation

## 2013-03-15 DIAGNOSIS — D649 Anemia, unspecified: Secondary | ICD-10-CM | POA: Insufficient documentation

## 2013-03-15 DIAGNOSIS — M79609 Pain in unspecified limb: Secondary | ICD-10-CM | POA: Insufficient documentation

## 2013-03-15 DIAGNOSIS — Z8679 Personal history of other diseases of the circulatory system: Secondary | ICD-10-CM | POA: Insufficient documentation

## 2013-03-15 DIAGNOSIS — Z794 Long term (current) use of insulin: Secondary | ICD-10-CM | POA: Insufficient documentation

## 2013-03-15 DIAGNOSIS — Z8781 Personal history of (healed) traumatic fracture: Secondary | ICD-10-CM | POA: Insufficient documentation

## 2013-03-15 DIAGNOSIS — Z8659 Personal history of other mental and behavioral disorders: Secondary | ICD-10-CM | POA: Insufficient documentation

## 2013-03-15 DIAGNOSIS — E1149 Type 2 diabetes mellitus with other diabetic neurological complication: Secondary | ICD-10-CM | POA: Insufficient documentation

## 2013-03-15 DIAGNOSIS — Z7901 Long term (current) use of anticoagulants: Secondary | ICD-10-CM | POA: Insufficient documentation

## 2013-03-15 DIAGNOSIS — Z9889 Other specified postprocedural states: Secondary | ICD-10-CM | POA: Insufficient documentation

## 2013-03-15 DIAGNOSIS — Z79899 Other long term (current) drug therapy: Secondary | ICD-10-CM | POA: Insufficient documentation

## 2013-03-15 DIAGNOSIS — N189 Chronic kidney disease, unspecified: Secondary | ICD-10-CM | POA: Insufficient documentation

## 2013-03-15 DIAGNOSIS — I129 Hypertensive chronic kidney disease with stage 1 through stage 4 chronic kidney disease, or unspecified chronic kidney disease: Secondary | ICD-10-CM | POA: Insufficient documentation

## 2013-03-15 DIAGNOSIS — Z792 Long term (current) use of antibiotics: Secondary | ICD-10-CM | POA: Insufficient documentation

## 2013-03-15 DIAGNOSIS — Z8719 Personal history of other diseases of the digestive system: Secondary | ICD-10-CM | POA: Insufficient documentation

## 2013-03-15 DIAGNOSIS — E1142 Type 2 diabetes mellitus with diabetic polyneuropathy: Secondary | ICD-10-CM | POA: Insufficient documentation

## 2013-03-15 DIAGNOSIS — F411 Generalized anxiety disorder: Secondary | ICD-10-CM | POA: Insufficient documentation

## 2013-03-15 DIAGNOSIS — Z8709 Personal history of other diseases of the respiratory system: Secondary | ICD-10-CM | POA: Insufficient documentation

## 2013-03-15 DIAGNOSIS — M129 Arthropathy, unspecified: Secondary | ICD-10-CM | POA: Insufficient documentation

## 2013-03-15 DIAGNOSIS — E7800 Pure hypercholesterolemia, unspecified: Secondary | ICD-10-CM | POA: Insufficient documentation

## 2013-03-15 DIAGNOSIS — E1140 Type 2 diabetes mellitus with diabetic neuropathy, unspecified: Secondary | ICD-10-CM

## 2013-03-15 DIAGNOSIS — Z87828 Personal history of other (healed) physical injury and trauma: Secondary | ICD-10-CM | POA: Insufficient documentation

## 2013-03-15 DIAGNOSIS — E119 Type 2 diabetes mellitus without complications: Secondary | ICD-10-CM

## 2013-03-15 DIAGNOSIS — Z880 Allergy status to penicillin: Secondary | ICD-10-CM | POA: Insufficient documentation

## 2013-03-15 DIAGNOSIS — Z760 Encounter for issue of repeat prescription: Secondary | ICD-10-CM | POA: Insufficient documentation

## 2013-03-15 MED ORDER — HYDROCODONE-ACETAMINOPHEN 5-325 MG PO TABS
1.0000 | ORAL_TABLET | Freq: Once | ORAL | Status: AC
  Administered 2013-03-15: 1 via ORAL
  Filled 2013-03-15: qty 1

## 2013-03-15 MED ORDER — HYDROCODONE-IBUPROFEN 7.5-200 MG PO TABS: 1.0000 | ORAL_TABLET | Freq: Four times a day (QID) | ORAL | Status: DC | PRN
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
## 2013-03-15 NOTE — ED Notes (Signed)
Pt states she had a partial foot amputation in Apr and ran out of her meds on Thursday. Now c/o severe pain to right leg and foot.

## 2013-03-15 NOTE — ED Notes (Addendum)
Pt reports chronic pain for "years." Worse today due to not have "pain pill prescription from my doctor." "I talked to the nurse at 4 in the morning screaming , and I have a splitting headache, and my hands get numb and pins-and-needles, and the felbonilga is shoot like eletricity all up my spine and legs." Pt reports Hydrocodone use, but "I didn't think about calling" to get prescription refilled.  Per [employer], pt has medication contract.

## 2013-03-16 NOTE — Telephone Encounter (Signed)
Pt was supposed to get from ortho per previous notes

## 2013-03-16 NOTE — ED Provider Notes (Signed)
Medical screening examination/treatment/procedure(s) were performed by non-physician practitioner and as supervising physician I was immediately available for consultation/collaboration.   Charles B. Karle Starch, MD 03/16/13 1304

## 2013-03-17 ENCOUNTER — Encounter (HOSPITAL_COMMUNITY): Payer: Self-pay

## 2013-03-17 ENCOUNTER — Encounter (HOSPITAL_COMMUNITY)
Admission: RE | Admit: 2013-03-17 | Discharge: 2013-03-17 | Disposition: A | Discharge status: Home / Self Care | Payer: BC Managed Care – PPO | Source: Ambulatory Visit | Attending: Orthopedic Surgery | Admitting: Orthopedic Surgery

## 2013-03-17 HISTORY — DX: Anemia, unspecified: D64.9

## 2013-03-17 HISTORY — DX: Fatty (change of) liver, not elsewhere classified: K76.0

## 2013-03-17 HISTORY — DX: Anxiety disorder, unspecified: F41.9

## 2013-03-17 LAB — PROTIME-INR
INR: 1.09 (ref 0.00–1.49)
Prothrombin Time: 14 seconds (ref 11.6–15.2)

## 2013-03-17 LAB — CBC
HCT: 33.9 % — ABNORMAL LOW (ref 36.0–46.0)
MCH: 26.1 pg (ref 26.0–34.0)
MCHC: 32.7 g/dL (ref 30.0–36.0)
MCV: 79.8 fL (ref 78.0–100.0)
RDW: 15.5 % (ref 11.5–15.5)

## 2013-03-17 LAB — APTT: aPTT: 29 seconds (ref 24–37)

## 2013-03-17 LAB — COMPREHENSIVE METABOLIC PANEL
Albumin: 3.2 g/dL — ABNORMAL LOW (ref 3.5–5.2)
BUN: 20 mg/dL (ref 6–23)
Creatinine, Ser: 0.91 mg/dL (ref 0.50–1.10)
Total Protein: 7.6 g/dL (ref 6.0–8.3)

## 2013-03-17 LAB — SURGICAL PCR SCREEN: Staphylococcus aureus: POSITIVE — AB

## 2013-03-17 NOTE — Telephone Encounter (Signed)
Patient calling the office, she states that her Orthopaedic doctor, Dr. Sharol Given, told her she has to get this medication from Dr. Etter Sjogren, because this if for her Myalgia.  Patient states, Dr. Sharol Given is her surgeon, and was only filling medications related to her amputation.  Patient states she had to make an ER visit on 03/15/13 because of being out of this medication, and states Dr. Etter Sjogren fills this medication for her.  Patient states she must have this refilled by today, and that she will come up here if she has to, to get this straightened out.

## 2013-03-17 NOTE — Pre-Procedure Instructions (Signed)
[name] [name]  03/17/2013   Your procedure is scheduled on:  03/19/2013, [facility]  Report to [place] at 8:00 AM.  Call this number if you have problems the morning of surgery: [phone]   Remember:   Do not eat food or drink liquids after midnight.---- TUESDAY   Take these medicines the morning of surgery with A SIP OF WATER:cymbalta , lexapro, gabapentin, skelaxin pain medicine is OK if needed    Do not wear jewelry, make-up or nail polish.  Do not wear lotions, powders, or perfumes. You may wear deodorant.  Do not shave 48 hours prior to surgery.   Do not bring valuables to the hospital.  Contacts, dentures or bridgework may not be worn into surgery.  Leave suitcase in the car. After surgery it may be brought to your room.  For patients admitted to the hospital, checkout time is 11:00 AM the day of  discharge.   Patients discharged the day of surgery will not be allowed to drive  home.  Name and phone number of your driver: with spouse   Special Instructions: Shower using CHG 2 nights before surgery and the night before surgery.  If you shower the day of surgery use CHG.  Use special wash - you have one bottle of CHG for all showers.  You should use approximately 1/3 of the bottle for each shower.   Please read over the following fact sheets that you were given: Pain Booklet, Coughing and Deep Breathing, MRSA Information and Surgical Site Infection Prevention

## 2013-03-18 ENCOUNTER — Ambulatory Visit (INDEPENDENT_AMBULATORY_CARE_PROVIDER_SITE_OTHER): Payer: BC Managed Care – PPO | Admitting: Endocrinology

## 2013-03-18 ENCOUNTER — Encounter: Payer: Self-pay | Admitting: Endocrinology

## 2013-03-18 VITALS — BP 122/78

## 2013-03-18 DIAGNOSIS — E1049 Type 1 diabetes mellitus with other diabetic neurological complication: Secondary | ICD-10-CM

## 2013-03-18 MED ORDER — CLINDAMYCIN PHOSPHATE 900 MG/50ML IV SOLN
900.0000 mg | INTRAVENOUS | Status: AC
  Administered 2013-03-19: 900 mg via INTRAVENOUS
  Filled 2013-03-18: qty 50
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
## 2013-03-18 NOTE — Patient Instructions (Addendum)
check your blood sugar twice a day.  vary the time of day when you check, between before the 3 meals, and at bedtime.  also check if you have symptoms of your blood sugar being too high or too low.  please keep a record of the readings and bring it to your next appointment here.  please call us sooner if your blood sugar goes below 70, or if you have a lot of readings over 200. please reduce the lantus to 25 units at bedtime.   Also, please increase the breakfast apidra to 15 units.   Please come back for a follow-up appointment in 2 weeks.   Tonight, take just 15 units of lantus.   Tomorrow, do not take apidra until you are eating again.

## 2013-03-19 ENCOUNTER — Ambulatory Visit (HOSPITAL_COMMUNITY): Payer: BC Managed Care – PPO | Admitting: Anesthesiology

## 2013-03-19 ENCOUNTER — Encounter (HOSPITAL_COMMUNITY): Payer: Self-pay | Admitting: Anesthesiology

## 2013-03-19 ENCOUNTER — Encounter (HOSPITAL_COMMUNITY)
Admission: RE | Disposition: A | Discharge status: Home / Self Care | Payer: Self-pay | Source: Ambulatory Visit | Attending: Orthopedic Surgery

## 2013-03-19 ENCOUNTER — Inpatient Hospital Stay (HOSPITAL_COMMUNITY)
Admission: RE | Admit: 2013-03-19 | Discharge: 2013-03-21 | DRG: 263 | Disposition: A | Discharge status: Home / Self Care | Payer: BC Managed Care – PPO | Source: Ambulatory Visit | Attending: Orthopedic Surgery | Admitting: Orthopedic Surgery

## 2013-03-19 ENCOUNTER — Encounter (HOSPITAL_COMMUNITY): Payer: Self-pay | Admitting: *Deleted

## 2013-03-19 DIAGNOSIS — IMO0002 Reserved for concepts with insufficient information to code with codable children: Secondary | ICD-10-CM

## 2013-03-19 DIAGNOSIS — N189 Chronic kidney disease, unspecified: Secondary | ICD-10-CM | POA: Diagnosis present

## 2013-03-19 DIAGNOSIS — E7800 Pure hypercholesterolemia, unspecified: Secondary | ICD-10-CM | POA: Diagnosis present

## 2013-03-19 DIAGNOSIS — K7689 Other specified diseases of liver: Secondary | ICD-10-CM | POA: Diagnosis present

## 2013-03-19 DIAGNOSIS — IMO0001 Reserved for inherently not codable concepts without codable children: Secondary | ICD-10-CM | POA: Diagnosis present

## 2013-03-19 DIAGNOSIS — F411 Generalized anxiety disorder: Secondary | ICD-10-CM | POA: Diagnosis present

## 2013-03-19 DIAGNOSIS — S91301D Unspecified open wound, right foot, subsequent encounter: Secondary | ICD-10-CM

## 2013-03-19 DIAGNOSIS — Z794 Long term (current) use of insulin: Secondary | ICD-10-CM

## 2013-03-19 DIAGNOSIS — I252 Old myocardial infarction: Secondary | ICD-10-CM

## 2013-03-19 DIAGNOSIS — D649 Anemia, unspecified: Secondary | ICD-10-CM | POA: Diagnosis present

## 2013-03-19 DIAGNOSIS — E1159 Type 2 diabetes mellitus with other circulatory complications: Secondary | ICD-10-CM | POA: Diagnosis present

## 2013-03-19 DIAGNOSIS — I96 Gangrene, not elsewhere classified: Secondary | ICD-10-CM | POA: Diagnosis present

## 2013-03-19 DIAGNOSIS — Z940 Kidney transplant status: Secondary | ICD-10-CM

## 2013-03-19 DIAGNOSIS — L97409 Non-pressure chronic ulcer of unspecified heel and midfoot with unspecified severity: Principal | ICD-10-CM | POA: Diagnosis present

## 2013-03-19 DIAGNOSIS — I129 Hypertensive chronic kidney disease with stage 1 through stage 4 chronic kidney disease, or unspecified chronic kidney disease: Secondary | ICD-10-CM | POA: Diagnosis present

## 2013-03-19 DIAGNOSIS — M159 Polyosteoarthritis, unspecified: Secondary | ICD-10-CM | POA: Diagnosis present

## 2013-03-19 DIAGNOSIS — S98919A Complete traumatic amputation of unspecified foot, level unspecified, initial encounter: Secondary | ICD-10-CM

## 2013-03-19 DIAGNOSIS — F4310 Post-traumatic stress disorder, unspecified: Secondary | ICD-10-CM | POA: Diagnosis present

## 2013-03-19 HISTORY — PX: SKIN GRAFT SPLIT THICKNESS LEG / FOOT: SUR1303

## 2013-03-19 HISTORY — PX: I&D EXTREMITY: SHX5045

## 2013-03-19 HISTORY — DX: Shortness of breath: R06.02

## 2013-03-19 HISTORY — DX: Unspecified osteoarthritis, unspecified site: M19.90

## 2013-03-19 HISTORY — PX: I & D EXTREMITY: SHX5045

## 2013-03-19 LAB — GLUCOSE, CAPILLARY
Glucose-Capillary: 119 mg/dL — ABNORMAL HIGH (ref 70–99)
Glucose-Capillary: 81 mg/dL (ref 70–99)

## 2013-03-19 SURGERY — IRRIGATION AND DEBRIDEMENT EXTREMITY
Anesthesia: General | Site: Foot | Laterality: Right | Wound class: Dirty or Infected

## 2013-03-19 MED ORDER — DULOXETINE HCL 60 MG PO CPEP
60.0000 mg | ORAL_CAPSULE | Freq: Every morning | ORAL | Status: DC
  Administered 2013-03-20 – 2013-03-21 (×2): 60 mg via ORAL
  Filled 2013-03-19 (×2): qty 1

## 2013-03-19 MED ORDER — PROPOFOL 10 MG/ML IV BOLUS
INTRAVENOUS | Status: DC | PRN
  Administered 2013-03-19: 170 mg via INTRAVENOUS

## 2013-03-19 MED ORDER — METOCLOPRAMIDE HCL 10 MG PO TABS: 5.0000 mg | ORAL_TABLET | Freq: Three times a day (TID) | ORAL | Status: DC | PRN

## 2013-03-19 MED ORDER — HYDROCODONE-ACETAMINOPHEN 7.5-325 MG PO TABS
1.0000 | ORAL_TABLET | ORAL | Status: DC | PRN
  Administered 2013-03-19 – 2013-03-21 (×3): 2 via ORAL
  Filled 2013-03-19 (×3): qty 2

## 2013-03-19 MED ORDER — ONDANSETRON HCL 4 MG PO TABS: 4.0000 mg | ORAL_TABLET | Freq: Four times a day (QID) | ORAL | Status: DC | PRN

## 2013-03-19 MED ORDER — COUMADIN BOOK
Freq: Once | Status: AC
  Administered 2013-03-19: 17:00:00
  Filled 2013-03-19: qty 1

## 2013-03-19 MED ORDER — HYDROMORPHONE HCL PF 1 MG/ML IJ SOLN: 0.5000 mg | INTRAMUSCULAR | Status: DC | PRN

## 2013-03-19 MED ORDER — INSULIN GLARGINE 100 UNITS/ML SOLOSTAR PEN: 25.0000 [IU] | PEN_INJECTOR | Freq: Every day | SUBCUTANEOUS | Status: DC

## 2013-03-19 MED ORDER — CLINDAMYCIN PHOSPHATE 600 MG/50ML IV SOLN
600.0000 mg | Freq: Four times a day (QID) | INTRAVENOUS | Status: AC
  Administered 2013-03-19 – 2013-03-20 (×3): 600 mg via INTRAVENOUS
  Filled 2013-03-19 (×4): qty 50

## 2013-03-19 MED ORDER — MIDAZOLAM HCL 5 MG/5ML IJ SOLN
INTRAMUSCULAR | Status: DC | PRN
  Administered 2013-03-19: 2 mg via INTRAVENOUS

## 2013-03-19 MED ORDER — PHENYLEPHRINE HCL 10 MG/ML IJ SOLN
INTRAMUSCULAR | Status: DC | PRN
  Administered 2013-03-19 (×3): 80 ug via INTRAVENOUS
  Administered 2013-03-19: 120 ug via INTRAVENOUS

## 2013-03-19 MED ORDER — METAXALONE 800 MG PO TABS
800.0000 mg | ORAL_TABLET | Freq: Three times a day (TID) | ORAL | Status: DC
  Administered 2013-03-19 – 2013-03-21 (×6): 800 mg via ORAL
  Filled 2013-03-19 (×9): qty 1

## 2013-03-19 MED ORDER — HYDROMORPHONE HCL PF 1 MG/ML IJ SOLN
0.2500 mg | INTRAMUSCULAR | Status: DC | PRN
  Administered 2013-03-19 (×3): 0.5 mg via INTRAVENOUS

## 2013-03-19 MED ORDER — ATORVASTATIN CALCIUM 10 MG PO TABS
10.0000 mg | ORAL_TABLET | Freq: Every day | ORAL | Status: DC
  Administered 2013-03-20 – 2013-03-21 (×2): 10 mg via ORAL
  Filled 2013-03-19 (×4): qty 1

## 2013-03-19 MED ORDER — WARFARIN - PHARMACIST DOSING INPATIENT: Freq: Every day | Status: DC

## 2013-03-19 MED ORDER — HYDROMORPHONE HCL PF 1 MG/ML IJ SOLN
INTRAMUSCULAR | Status: AC
  Filled 2013-03-19: qty 1

## 2013-03-19 MED ORDER — INSULIN ASPART 100 UNIT/ML ~~LOC~~ SOLN
4.0000 [IU] | Freq: Three times a day (TID) | SUBCUTANEOUS | Status: DC
  Administered 2013-03-20 – 2013-03-21 (×4): 4 [IU] via SUBCUTANEOUS

## 2013-03-19 MED ORDER — WARFARIN VIDEO: Freq: Once | Status: DC

## 2013-03-19 MED ORDER — INSULIN GLARGINE 100 UNIT/ML ~~LOC~~ SOLN
25.0000 [IU] | Freq: Every day | SUBCUTANEOUS | Status: DC
  Administered 2013-03-19 – 2013-03-20 (×2): 25 [IU] via SUBCUTANEOUS
  Filled 2013-03-19 (×3): qty 0.25

## 2013-03-19 MED ORDER — WARFARIN SODIUM 5 MG PO TABS
5.0000 mg | ORAL_TABLET | Freq: Once | ORAL | Status: AC
  Administered 2013-03-19: 5 mg via ORAL
  Filled 2013-03-19: qty 1

## 2013-03-19 MED ORDER — LIDOCAINE HCL (CARDIAC) 20 MG/ML IV SOLN
INTRAVENOUS | Status: DC | PRN
  Administered 2013-03-19: 70 mg via INTRAVENOUS

## 2013-03-19 MED ORDER — ONDANSETRON HCL 4 MG/2ML IJ SOLN
4.0000 mg | Freq: Four times a day (QID) | INTRAMUSCULAR | Status: DC | PRN
  Administered 2013-03-20 – 2013-03-21 (×3): 4 mg via INTRAVENOUS
  Filled 2013-03-19 (×3): qty 2

## 2013-03-19 MED ORDER — WARFARIN SODIUM 2.5 MG PO TABS: 2.5000 mg | ORAL_TABLET | Freq: Every day | ORAL | Status: DC

## 2013-03-19 MED ORDER — ONDANSETRON HCL 4 MG/2ML IJ SOLN
INTRAMUSCULAR | Status: DC | PRN
  Administered 2013-03-19: 4 mg via INTRAVENOUS

## 2013-03-19 MED ORDER — HYDROCHLOROTHIAZIDE 25 MG PO TABS
25.0000 mg | ORAL_TABLET | Freq: Every day | ORAL | Status: DC
  Administered 2013-03-19 – 2013-03-21 (×3): 25 mg via ORAL
  Filled 2013-03-19 (×3): qty 1

## 2013-03-19 MED ORDER — ESCITALOPRAM OXALATE 10 MG PO TABS
10.0000 mg | ORAL_TABLET | Freq: Every day | ORAL | Status: DC
  Administered 2013-03-19 – 2013-03-21 (×3): 10 mg via ORAL
  Filled 2013-03-19 (×3): qty 1

## 2013-03-19 MED ORDER — INSULIN ASPART 100 UNIT/ML ~~LOC~~ SOLN
0.0000 [IU] | Freq: Three times a day (TID) | SUBCUTANEOUS | Status: DC
  Administered 2013-03-20: 2 [IU] via SUBCUTANEOUS
  Administered 2013-03-20 – 2013-03-21 (×2): 3 [IU] via SUBCUTANEOUS
  Administered 2013-03-21: 2 [IU] via SUBCUTANEOUS

## 2013-03-19 MED ORDER — METOCLOPRAMIDE HCL 5 MG/ML IJ SOLN: 5.0000 mg | Freq: Three times a day (TID) | INTRAMUSCULAR | Status: DC | PRN

## 2013-03-19 MED ORDER — LACTATED RINGERS IV SOLN
INTRAVENOUS | Status: DC
  Administered 2013-03-19: 09:00:00 via INTRAVENOUS

## 2013-03-19 MED ORDER — LACTATED RINGERS IV SOLN
INTRAVENOUS | Status: DC | PRN
  Administered 2013-03-19: 09:00:00 via INTRAVENOUS

## 2013-03-19 MED ORDER — POLYETHYLENE GLYCOL 3350 17 G PO PACK
17.0000 g | PACK | Freq: Every day | ORAL | Status: DC
  Administered 2013-03-19 – 2013-03-20 (×2): 17 g via ORAL
  Filled 2013-03-19 (×3): qty 1

## 2013-03-19 MED ORDER — WARFARIN - PHYSICIAN DOSING INPATIENT: Freq: Every day | Status: DC

## 2013-03-19 MED ORDER — SODIUM CHLORIDE 0.9 % IR SOLN
Status: DC | PRN
  Administered 2013-03-19: 1000 mL

## 2013-03-19 MED ORDER — SODIUM CHLORIDE 0.9 % IV SOLN: INTRAVENOUS | Status: DC

## 2013-03-19 MED ORDER — GABAPENTIN 300 MG PO CAPS
600.0000 mg | ORAL_CAPSULE | Freq: Three times a day (TID) | ORAL | Status: DC
  Administered 2013-03-19 – 2013-03-21 (×6): 600 mg via ORAL
  Filled 2013-03-19 (×8): qty 2

## 2013-03-19 MED ORDER — OXYCODONE-ACETAMINOPHEN 5-325 MG PO TABS
1.0000 | ORAL_TABLET | ORAL | Status: DC | PRN
  Administered 2013-03-19 – 2013-03-21 (×6): 2 via ORAL
  Filled 2013-03-19 (×6): qty 2

## 2013-03-19 SURGICAL SUPPLY — 47 items
BLADE SURG 10 STRL SS (BLADE) IMPLANT
BNDG COHESIVE 4X5 TAN STRL (GAUZE/BANDAGES/DRESSINGS) IMPLANT
BNDG COHESIVE 6X5 TAN STRL LF (GAUZE/BANDAGES/DRESSINGS) ×2 IMPLANT
BNDG GAUZE STRTCH 6 (GAUZE/BANDAGES/DRESSINGS) IMPLANT
CLOTH BEACON ORANGE TIMEOUT ST (SAFETY) ×2 IMPLANT
COTTON STERILE ROLL (GAUZE/BANDAGES/DRESSINGS) IMPLANT
COVER SURGICAL LIGHT HANDLE (MISCELLANEOUS) ×2 IMPLANT
CUFF TOURNIQUET SINGLE 18IN (TOURNIQUET CUFF) IMPLANT
CUFF TOURNIQUET SINGLE 24IN (TOURNIQUET CUFF) IMPLANT
CUFF TOURNIQUET SINGLE 34IN LL (TOURNIQUET CUFF) IMPLANT
CUFF TOURNIQUET SINGLE 44IN (TOURNIQUET CUFF) IMPLANT
DRAPE INCISE IOBAN 66X45 STRL (DRAPES) ×2 IMPLANT
DRAPE U-SHAPE 47X51 STRL (DRAPES) ×2 IMPLANT
DRSG ADAPTIC 3X8 NADH LF (GAUZE/BANDAGES/DRESSINGS) IMPLANT
DRSG MEPITEL 4X7.2 (GAUZE/BANDAGES/DRESSINGS) ×2 IMPLANT
DRSG VAC ATS MED SENSATRAC (GAUZE/BANDAGES/DRESSINGS) ×2 IMPLANT
DURAPREP 26ML APPLICATOR (WOUND CARE) ×2 IMPLANT
ELECT CAUTERY BLADE 6.4 (BLADE) ×2 IMPLANT
ELECT REM PT RETURN 9FT ADLT (ELECTROSURGICAL) ×2
ELECTRODE REM PT RTRN 9FT ADLT (ELECTROSURGICAL) ×1 IMPLANT
GLOVE BIOGEL PI IND STRL 9 (GLOVE) ×1 IMPLANT
GLOVE BIOGEL PI INDICATOR 9 (GLOVE) ×1
GLOVE SURG ORTHO 9.0 STRL STRW (GLOVE) ×2 IMPLANT
GOWN PREVENTION PLUS XLARGE (GOWN DISPOSABLE) ×2 IMPLANT
GOWN SRG XL XLNG 56XLVL 4 (GOWN DISPOSABLE) ×1 IMPLANT
GOWN STRL NON-REIN XL XLG LVL4 (GOWN DISPOSABLE) ×1
HANDPIECE INTERPULSE COAX TIP (DISPOSABLE) ×1
KIT BASIN OR (CUSTOM PROCEDURE TRAY) ×2 IMPLANT
KIT ROOM TURNOVER OR (KITS) ×2 IMPLANT
MANIFOLD NEPTUNE II (INSTRUMENTS) ×2 IMPLANT
NS IRRIG 1000ML POUR BTL (IV SOLUTION) ×2 IMPLANT
PACK ORTHO EXTREMITY (CUSTOM PROCEDURE TRAY) ×2 IMPLANT
PAD ARMBOARD 7.5X6 YLW CONV (MISCELLANEOUS) ×4 IMPLANT
PADDING CAST COTTON 6X4 STRL (CAST SUPPLIES) IMPLANT
SET HNDPC FAN SPRY TIP SCT (DISPOSABLE) ×1 IMPLANT
SPONGE GAUZE 4X4 12PLY (GAUZE/BANDAGES/DRESSINGS) IMPLANT
SPONGE LAP 18X18 X RAY DECT (DISPOSABLE) ×2 IMPLANT
STAPLER VISISTAT 35W (STAPLE) ×2 IMPLANT
STOCKINETTE IMPERVIOUS 9X36 MD (GAUZE/BANDAGES/DRESSINGS) ×2 IMPLANT
TISSUE THERASKIN 2X3 (Tissue) ×4 IMPLANT
TOWEL OR 17X24 6PK STRL BLUE (TOWEL DISPOSABLE) ×2 IMPLANT
TOWEL OR 17X26 10 PK STRL BLUE (TOWEL DISPOSABLE) ×2 IMPLANT
TUBE ANAEROBIC SPECIMEN COL (MISCELLANEOUS) IMPLANT
TUBE CONNECTING 12X1/4 (SUCTIONS) ×2 IMPLANT
UNDERPAD 30X30 INCONTINENT (UNDERPADS AND DIAPERS) ×2 IMPLANT
WATER STERILE IRR 1000ML POUR (IV SOLUTION) IMPLANT
YANKAUER SUCT BULB TIP NO VENT (SUCTIONS) ×2 IMPLANT

## 2013-03-19 NOTE — Op Note (Signed)
OPERATIVE REPORT  DATE OF SURGERY: 03/19/2013  PATIENT:  [name] [name],  57 y.o. female  PRE-OPERATIVE DIAGNOSIS:  Gangrenous Eschar Right Foot  POST-OPERATIVE DIAGNOSIS:  Gangrenous Eschar Right Foot  PROCEDURE:  Procedure(s): Right Foot Debride Eschar Apply Skin Graft 2 sheets of the 2 x 3" allograft split thickness skin graft Wound 6 x 15 cm. Excisional debridement of skin soft tissue and eschar Application of wound VAC  SURGEON:  Surgeon(s): [name], MD  ANESTHESIA:   general  EBL:  min ML  SPECIMEN:  No Specimen  TOURNIQUET:  * No tourniquets in log *  PROCEDURE DETAILS: Patient is a 58 year old woman who is status post foot salvage surgery for amputation of the right foot fifth ray who has developed gangrenous eschar over the dorsum of her foot and has failed conservative wound care and presents at this time for surgical intervention. Risks and benefits were discussed including nonhealing of the wound need for additional amputation. Patient states she understands and wished to proceed at this time. Description of procedure patient brought to the operating room and underwent a general anesthetic. After adequate levels of anesthesia were obtained patient's right lower extremity was prepped using DuraPrep draped into a sterile field. Using a 10 blade knife the skin soft tissue and eschar was debrided from the dorsum of the foot. Approximately 6 x 15 cm open wound was excised. Pulsatile lavage was then used for further debridement and patient had good bleeding petechial granulation tissue. The split thickness skin graft allograft was then used 2 pieces were used. This was secured with staples. A Mepitel dressing was applied followed by a wound VAC set to -75 mm mercury. Patient was extubated taken to the PACU in stable condition.  PLAN OF CARE: Admit to inpatient   PATIENT DISPOSITION:  PACU - hemodynamically stable.   [name], MD 03/19/2013 10:44 AM

## 2013-03-19 NOTE — Anesthesia Procedure Notes (Signed)
Procedure Name: LMA Insertion Date/Time: 03/19/2013 10:18 AM Performed by: Carney Living Pre-anesthesia Checklist: Patient identified, Emergency Drugs available, Suction available, Patient being monitored and Timeout performed Patient Re-evaluated:Patient Re-evaluated prior to inductionOxygen Delivery Method: Circle system utilized Preoxygenation: Pre-oxygenation with 100% oxygen Intubation Type: IV induction LMA: LMA inserted LMA Size: 4.0 Number of attempts: 1 Placement Confirmation: positive ETCO2 and breath sounds checked- equal and bilateral Tube secured with: Tape Dental Injury: Teeth and Oropharynx as per pre-operative assessment

## 2013-03-19 NOTE — Transfer of Care (Signed)
Immediate Anesthesia Transfer of Care Note  Patient: [name] [name]  Procedure(s) Performed: Procedure(s) with comments: Right Foot Debride Eschar and Apply Skin Graft and Wound VAC (Right) - Right Foot Debride Eschar and Apply Skin Graft and Wound VAC  Patient Location: PACU  Anesthesia Type:General  Level of Consciousness: awake, oriented and patient cooperative  Airway & Oxygen Therapy: Patient Spontanous Breathing and Patient connected to nasal cannula oxygen  Post-op Assessment: Report given to PACU RN, Post -op Vital signs reviewed and stable and Patient moving all extremities X 4  Post vital signs: Reviewed and stable  Complications: No apparent anesthesia complications
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
## 2013-03-19 NOTE — Anesthesia Preprocedure Evaluation (Addendum)
Anesthesia Evaluation  Patient identified by MRN, date of birth, ID band Patient awake    Reviewed: Allergy & Precautions, H&P , NPO status , Patient's Chart, lab work & pertinent test results  Airway Mallampati: II      Dental   Pulmonary shortness of breath,  breath sounds clear to auscultation        Cardiovascular hypertension, + angina + Past MI Rhythm:Regular Rate:Normal     Neuro/Psych    GI/Hepatic negative GI ROS, Neg liver ROS,   Endo/Other  diabetes  Renal/GU Renal disease     Musculoskeletal  (+) Fibromyalgia -  Abdominal   Peds  Hematology   Anesthesia Other Findings   Reproductive/Obstetrics                          Anesthesia Physical Anesthesia Plan  ASA: III  Anesthesia Plan: General   Post-op Pain Management:    Induction: Intravenous  Airway Management Planned: LMA  Additional Equipment:   Intra-op Plan:   Post-operative Plan: Extubation in OR  Informed Consent:   Dental advisory given  Plan Discussed with: CRNA and Anesthesiologist  Anesthesia Plan Comments:         Anesthesia Quick Evaluation

## 2013-03-19 NOTE — Progress Notes (Signed)
Report rec;d from [facility] for lunch relief

## 2013-03-19 NOTE — Preoperative (Signed)
Beta Blockers   Reason not to administer Beta Blockers:Not Applicable 

## 2013-03-19 NOTE — Anesthesia Postprocedure Evaluation (Signed)
  Anesthesia Post-op Note  Patient: [name] [name]  Procedure(s) Performed: Procedure(s) with comments: Right Foot Debride Eschar and Apply Skin Graft and Wound VAC (Right) - Right Foot Debride Eschar and Apply Skin Graft and Wound VAC  Patient Location: PACU  Anesthesia Type:General  Level of Consciousness: awake  Airway and Oxygen Therapy: Patient Spontanous Breathing  Post-op Pain: mild  Post-op Assessment: Post-op Vital signs reviewed  Post-op Vital Signs: Reviewed  Complications: No apparent anesthesia complications

## 2013-03-19 NOTE — Progress Notes (Addendum)
ANTICOAGULATION CONSULT NOTE - Initial Consult  Pharmacy Consult for Coumadin Indication: VTE prophylaxis  Allergies  Allergen Reactions  . Fish-Derived Products Hives, Shortness Of Breath, Swelling and Rash    Hives get in throat causing trouble breathing  . Mushroom Extract Complex Anaphylaxis  . Penicillins Anaphylaxis  . Shellfish Allergy Hives, Shortness Of Breath, Swelling and Rash  . Tomato Hives and Shortness Of Breath    Hives in throat causes her trouble breathing  . Aloe Vera     hives    Patient Measurements: Height: 5' (152.4 cm) Weight: 129 lb (58.514 kg) (from preadmit 03/17/13) IBW/kg (Calculated) : 45.5 kg  Vital Signs: Temp: 98.1 F (36.7 C) (05/21 1430) BP: 119/65 mmHg (05/21 1510) Pulse Rate: 82 (05/21 1430)  Labs:  Recent Labs  03/17/13 1615  HGB 11.1*  HCT 33.9*  PLT 156  APTT 29  LABPROT 14.0  INR 1.09  CREATININE 0.91    Estimated Creatinine Clearance: 54.6 ml/min (by C-G formula based on Cr of 0.91).   Medical History: Past Medical History  Diagnosis Date  . Hypertension   . Fibromyalgia   . High cholesterol   . PTSD (post-traumatic stress disorder)   . Anginal pain   . Chronic kidney disease   . Proximal humerus fracture 10/15/12    Left  . Acute MI 2006    pt. reports it was in 2007, presented to ED & had cardiac cath- but found to have normal coronaries. Since that point in time her PCP cares f or cardiac needs. Dr. [name] - [facility]  . Diabetes mellitus     insulin dependent, adult onset  . Shortness of breath   . Arthritis     OA- hands, hips  . Anemia   . Fatty liver   . Anxiety     Medications:  Prescriptions prior to admission  Medication Sig Dispense Refill  . atorvastatin (LIPITOR) 10 MG tablet Take 10 mg by mouth daily with breakfast.      . Calcium Carbonate-Vitamin D (CALTRATE 600+D) 600-400 MG-UNIT per tablet Take 1 tablet by mouth daily.       . Cholecalciferol (VITAMIN D3) 5000 UNITS CAPS Take 1  capsule by mouth 2 (two) times daily.      [name] Kitchen CINNAMON PO Take 1,000 mg by mouth daily.      [name] Kitchen doxycycline (VIBRA-TABS) 100 MG tablet Take 100 mg by mouth 2 (two) times daily.      . DULoxetine (CYMBALTA) 60 MG capsule Take 60 mg by mouth every morning.      . escitalopram (LEXAPRO) 10 MG tablet Take 10 mg by mouth daily.       . ferrous sulfate 325 (65 FE) MG tablet Take 325 mg by mouth 2 (two) times daily.       . Flaxseed, Linseed, (FLAX SEED OIL) 1000 MG CAPS Take 1 capsule by mouth at bedtime.      . gabapentin (NEURONTIN) 300 MG capsule Take 2 capsules (600 mg total) by mouth 3 (three) times daily.  180 capsule  5  . hydrochlorothiazide (HYDRODIURIL) 25 MG tablet Take 1 tablet (25 mg total) by mouth daily.  30 tablet  11  . HYDROcodone-ibuprofen (VICOPROFEN) 7.5-200 MG per tablet TAKE ONE TABLET BY MOUTH EVERY 6 HOURS AS NEEDED  50 tablet  0  . insulin glargine (LANTUS) 100 units/mL SOLN Inject 25 Units into the skin at bedtime.       . insulin glulisine (APIDRA) 100 UNIT/ML injection 15  units with breakfast and 10 units with the evening meal      . metaxalone (SKELAXIN) 800 MG tablet Take 1 tablet (800 mg total) by mouth 3 (three) times daily.  30 tablet  1  . Multiple Vitamin (MULTIVITAMIN) tablet Take 1 tablet by mouth daily.       . polyethylene glycol (MIRALAX / GLYCOLAX) packet Take 17 g by mouth daily.        . vitamin C (ASCORBIC ACID) 500 MG tablet Take 500 mg by mouth daily.      [name] Kitchen HYDROcodone-ibuprofen (VICOPROFEN) 7.5-200 MG per tablet Take 1 tablet by mouth every 6 (six) hours as needed for pain.  7 tablet  0  . warfarin (COUMADIN) 2.5 MG tablet Take 2.5 mg by mouth every morning.       Scheduled:  . [START ON 03/20/2013] atorvastatin  10 mg Oral Q breakfast  . clindamycin (CLEOCIN) IV  600 mg Intravenous Q6H  . [START ON 03/20/2013] DULoxetine  60 mg Oral q morning - 10a  . escitalopram  10 mg Oral Daily  . gabapentin  600 mg Oral TID  . hydrochlorothiazide  25 mg Oral Daily   . HYDROmorphone      . HYDROmorphone      . insulin aspart  0-15 Units Subcutaneous TID WC  . insulin aspart  4 Units Subcutaneous TID WC  . insulin glargine  25 Units Subcutaneous QHS  . metaxalone  800 mg Oral TID  . polyethylene glycol  17 g Oral Daily    Assessment: 57 y.o. Female with diabetes periphera vascular disease s/p right foot debridement and application of skin graft and wound VAC.  To start coumadin for VTE prophylaxis.  Baseline INR = 1.09 on 03/17/13.  H/H 11.1/33.9 and pltc 156K on preadmit 03/17/13.   Goal of Therapy:  INR 2-3 Monitor platelets by anticoagulation protocol: Yes   Plan:  Coumadin 28m po x 1 tonight. Monitor PT/INR daily F/u CBC Coumadin education.  RNicole Cella RPh Clinical Pharmacist Pager: [phone]/21/2014,4:53 PM

## 2013-03-19 NOTE — Progress Notes (Deleted)
Unable to place TED hose,surgery on right foot,left foot amputated.

## 2013-03-20 ENCOUNTER — Encounter (HOSPITAL_COMMUNITY): Payer: Self-pay | Admitting: General Practice

## 2013-03-20 LAB — PROTIME-INR
INR: 1.13 (ref 0.00–1.49)
Prothrombin Time: 14.3 seconds (ref 11.6–15.2)

## 2013-03-20 LAB — GLUCOSE, CAPILLARY
Glucose-Capillary: 88 mg/dL (ref 70–99)
Glucose-Capillary: 101 mg/dL — ABNORMAL HIGH (ref 70–99)

## 2013-03-20 MED ORDER — WARFARIN SODIUM 5 MG PO TABS
5.0000 mg | ORAL_TABLET | Freq: Once | ORAL | Status: AC
  Administered 2013-03-20: 5 mg via ORAL
  Filled 2013-03-20: qty 1

## 2013-03-20 MED ORDER — MUPIROCIN CALCIUM 2 % EX CREA
TOPICAL_CREAM | Freq: Every day | CUTANEOUS | Status: DC
  Administered 2013-03-20: 12:00:00 via TOPICAL
  Filled 2013-03-20: qty 15

## 2013-03-20 NOTE — Progress Notes (Signed)
ANTICOAGULATION CONSULT NOTE -follow up [facility] for Coumadin Indication: VTE prophylaxis  Allergies  Allergen Reactions  . Fish-Derived Products Hives, Shortness Of Breath, Swelling and Rash    Hives get in throat causing trouble breathing  . Mushroom Extract Complex Anaphylaxis  . Penicillins Anaphylaxis  . Shellfish Allergy Hives, Shortness Of Breath, Swelling and Rash  . Tomato Hives and Shortness Of Breath    Hives in throat causes her trouble breathing  . Aloe Vera     hives    Patient Measurements: Height: 5' (152.4 cm) Weight: 129 lb (58.514 kg) (from preadmit 03/17/13) IBW/kg (Calculated) : 45.5 kg  Vital Signs: Temp: 98.4 F (36.9 C) (05/22 1333) Temp src: Oral (05/22 1333) BP: 97/59 mmHg (05/22 1333) Pulse Rate: 91 (05/22 1333)  Labs:  Recent Labs  03/17/13 1615 03/20/13 0445  HGB 11.1*  --   HCT 33.9*  --   PLT 156  --   APTT 29  --   LABPROT 14.0 14.3  INR 1.09 1.13  CREATININE 0.91  --     Estimated Creatinine Clearance: 54.6 ml/min (by C-G formula based on Cr of 0.91).  Assessment: 57 y.o. Female with diabetes peripheral vascular disease s/p right foot debridement and application of skin graft and wound VAC.   She was on coumadin 2.5 mg daily PTA with last dose PTA 03/08/13.  INR still baseline after first post-op dose of 5 mg, as expected.  No bleeding reported.  Pt refused coumadin video.  Coumadin book given to pt last evening.   Goal of Therapy:  INR 2-3 Monitor platelets by anticoagulation protocol: Yes   Plan:  Repeat Coumadin 37m po x 1 tonight. Home dose is 2.5 mg daily ? LMWH 30 qday until INR tx? Monitor PT/INR daily MEudelia Bunch Pharm.D. [phone]/22/2014 2:22 PM

## 2013-03-20 NOTE — Evaluation (Signed)
Physical Therapy Evaluation Patient Details Name: [name] [name] MRN: [id number] DOB: 08/22/1956 Today's Date: 03/20/2013 Time: 7412-8786 PT Time Calculation (min): 15 min  PT Assessment / Plan / Recommendation Clinical Impression  Pt will benefit from skilled PT to address balance, gait and mobility deficits and progress to mod I level for safe d/c home.    PT Assessment  Patient needs continued PT services    Follow Up Recommendations  No PT follow up    Does the patient have the potential to tolerate intense rehabilitation      Barriers to Discharge        Equipment Recommendations       Recommendations for Other Services     Frequency Min 5X/week    Precautions / Restrictions Restrictions Weight Bearing Restrictions: Yes RLE Weight Bearing: Non weight bearing   Pertinent Vitals/Pain Pt c/o 9/10 pain, RN made aware      Mobility  Bed Mobility Bed Mobility: Supine to Sit Supine to Sit: 6: Modified independent (Device/Increase time) Transfers Transfers: Programmer, applications Transfers: 5: Supervision Details for Transfer Assistance: uses knee walker with assist only to manage wound vac and IV pole Ambulation/Gait Ambulation/Gait Assistance: 5: Supervision Ambulation Distance (Feet): 10 Feet Assistive device: Other (Comment) (knee walker) Ambulation/Gait Assistance Details: supervision assist, good safety awareness Stairs: No Wheelchair Mobility Wheelchair Mobility: No    Exercises     PT Diagnosis: Difficulty walking;Generalized weakness;Acute pain  PT Problem List: Decreased activity tolerance;Decreased balance;Decreased mobility;Pain PT Treatment Interventions: Gait training;DME instruction;Stair training;Functional mobility training;Therapeutic activities;Patient/family education;Balance training;Therapeutic exercise;Wheelchair mobility training;Modalities   PT Goals Acute Rehab PT Goals PT Goal Formulation: With patient Pt will Transfer  Bed to Chair/Chair to Bed: with modified independence Pt will Ambulate: 51 - 150 feet;with modified independence Pt will Go Up / Down Stairs: 1-2 stairs;with supervision  Visit Information  Last PT Received On: 03/20/13 Assistance Needed: +1    Subjective Data  Subjective: I feel ok Patient Stated Goal: go home   Prior [place] Lives With: Spouse Available Help at Discharge: Family Type of Home: House Home Access: Stairs to enter Technical brewer of Steps: 2 Home Layout: Two level Alternate Level Stairs-Number of Steps: 12 Home Adaptive Equipment: Other (comment) (knee scooter) Additional Comments: Pt uses knee scooter for stairs to enter home, goes up stairs inside home on her bottom Prior Function Level of Independence: Independent with assistive device(s) Able to Take Stairs?: Yes Driving: No Communication Communication: No difficulties    Cognition  Cognition Arousal/Alertness: Awake/alert Behavior During Therapy: WFL for tasks assessed/performed Overall Cognitive Status: Within Functional Limits for tasks assessed    Extremity/Trunk Assessment Right Lower Extremity Assessment RLE ROM/Strength/Tone: Within functional levels RLE Coordination: WFL - gross motor Left Lower Extremity Assessment LLE ROM/Strength/Tone: Within functional levels LLE Coordination: WFL - gross motor Trunk Assessment Trunk Assessment: Normal   Balance Balance Balance Assessed: Yes Dynamic Standing Balance Dynamic Standing - Balance Support: Right upper extremity supported;During functional activity Dynamic Standing - Level of Assistance: 5: Stand by assistance  End of Session PT - End of Session Activity Tolerance: Patient tolerated treatment well Patient left: in chair;with call bell/phone within reach Nurse Communication: Mobility status  GP     Shauntelle Jamerson 03/20/2013, 10:26 AM

## 2013-03-20 NOTE — Progress Notes (Signed)
[name], female   DOB: 07/05/1956, 57 y.o.   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 Examination the wound VAC is functioning well. Set at -95 mm of suction. Plan for discharge tomorrow.

## 2013-03-21 LAB — GLUCOSE, CAPILLARY: Glucose-Capillary: 153 mg/dL — ABNORMAL HIGH (ref 70–99)

## 2013-03-21 LAB — PROTIME-INR: INR: 1.44 (ref 0.00–1.49)

## 2013-03-21 MED ORDER — METAXALONE 800 MG PO TABS: 800.0000 mg | ORAL_TABLET | Freq: Three times a day (TID) | ORAL | Status: DC

## 2013-03-21 MED ORDER — HYDROCODONE-IBUPROFEN 7.5-200 MG PO TABS: 1.0000 | ORAL_TABLET | Freq: Four times a day (QID) | ORAL | Status: DC | PRN

## 2013-03-21 NOTE — Discharge Summary (Signed)
Physician Discharge Summary  [name] MRN: [id number] DOB/AGE: [birth year] 57 y.o.  Admit date: 03/19/2013 Discharge date: 03/21/2013  Admission Diagnoses: Gangrene dorsum right foot  Discharge Diagnoses: Gangrene dorsum right foot Active Problems:   * No active hospital problems. *   Discharged Condition: stable  Hospital Course: Patient's hospital course was essentially unremarkable. She underwent excisional debridement of the dorsum of the right foot placement of split thickness skin graft and wound VAC. Patient was discharged to home in stable condition.  Consults: None  Significant Diagnostic Studies: labs: Routine labs  Treatments: surgery: See operative note  Discharge Exam: Blood pressure 108/57, pulse 70, temperature 98.1 F (36.7 C), temperature source Oral, resp. rate 16, height 5' (1.524 m), weight 58.514 kg (129 lb), SpO2 95.00%. Incision/Wound: surgical wound and clean dry and viable at time of discharge  Disposition: 01-Home or Self Care  Discharge Orders   Future Appointments Provider Department Dept Phone   03/31/2013 2:00 PM [id number] [place] at  [place]   04/01/2013 4:00 PM Renato Shin, MD [facility] END[facility] [phone]   Future Orders Complete By Expires     Call MD / Call 911  As directed     Comments:      If you experience chest pain or shortness of breath, CALL 911 and be transported to the hospital emergency room.  If you develope a fever above 101 F, pus (white drainage) or increased drainage or redness at the wound, or calf pain, call your surgeon's office.    Constipation Prevention  As directed     Comments:      Drink plenty of fluids.  Prune juice may be helpful.  You may use a stool softener, such as Colace (over the counter) 100 mg twice a day.  Use MiraLax (over the counter) for constipation as needed.    Diet - low sodium heart healthy  As directed     Increase activity  slowly as tolerated  As directed     Non weight bearing  As directed     Scheduling Instructions:      Nonweightbearing right foot        Medication List    TAKE these medications       atorvastatin 10 MG tablet  Commonly known as:  LIPITOR  Take 10 mg by mouth daily with breakfast.     CALTRATE 600+D 600-400 MG-UNIT per tablet  Generic drug:  Calcium Carbonate-Vitamin D  Take 1 tablet by mouth daily.     CINNAMON PO  Take 1,000 mg by mouth daily.     doxycycline 100 MG tablet  Commonly known as:  VIBRA-TABS  Take 100 mg by mouth 2 (two) times daily.     DULoxetine 60 MG capsule  Commonly known as:  CYMBALTA  Take 60 mg by mouth every morning.     escitalopram 10 MG tablet  Commonly known as:  LEXAPRO  Take 10 mg by mouth daily.     ferrous sulfate 325 (65 FE) MG tablet  Take 325 mg by mouth 2 (two) times daily.     Flax Seed Oil 1000 MG Caps  Take 1 capsule by mouth at bedtime.     gabapentin 300 MG capsule  Commonly known as:  NEURONTIN  Take 2 capsules (600 mg total) by mouth 3 (three) times daily.     hydrochlorothiazide 25 MG tablet  Commonly known as:  HYDRODIURIL  Take 1 tablet (25 mg total)  by mouth daily.     HYDROcodone-ibuprofen 7.5-200 MG per tablet  Commonly known as:  VICOPROFEN  TAKE ONE TABLET BY MOUTH EVERY 6 HOURS AS NEEDED     HYDROcodone-ibuprofen 7.5-200 MG per tablet  Commonly known as:  VICOPROFEN  Take 1 tablet by mouth every 6 (six) hours as needed for pain.     insulin glargine 100 units/mL Soln  Commonly known as:  LANTUS  Inject 25 Units into the skin at bedtime.     insulin glulisine 100 UNIT/ML injection  Commonly known as:  APIDRA  15 units with breakfast and 10 units with the evening meal     metaxalone 800 MG tablet  Commonly known as:  SKELAXIN  Take 1 tablet (800 mg total) by mouth 3 (three) times daily.     metaxalone 800 MG tablet  Commonly known as:  SKELAXIN  Take 1 tablet (800 mg total) by mouth 3 (three)  times daily.     multivitamin tablet  Take 1 tablet by mouth daily.     polyethylene glycol packet  Commonly known as:  MIRALAX / GLYCOLAX  Take 17 g by mouth daily.     vitamin C 500 MG tablet  Commonly known as:  ASCORBIC ACID  Take 500 mg by mouth daily.     Vitamin D3 5000 UNITS Caps  Take 1 capsule by mouth 2 (two) times daily.     warfarin 2.5 MG tablet  Commonly known as:  COUMADIN  Take 2.5 mg by mouth every morning.           Follow-up Information   Follow up with DUDA,MARCUS V, MD In 1 week.   Contact information:   [place] [address] [phone]       Signed: [name] 03/21/2013, 6:25 AM

## 2013-03-31 ENCOUNTER — Other Ambulatory Visit: Payer: BC Managed Care – PPO

## 2013-04-01 ENCOUNTER — Ambulatory Visit: Payer: BC Managed Care – PPO | Admitting: Endocrinology

## 2013-04-01 ENCOUNTER — Telehealth: Payer: Self-pay | Admitting: Family Medicine

## 2013-04-01 DIAGNOSIS — Z0289 Encounter for other administrative examinations: Secondary | ICD-10-CM

## 2013-04-01 NOTE — Telephone Encounter (Signed)
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 She stated that her surgery was on 03/11/2013 with Dr. Antoine Primas." The last refill from this office shown was back in 08/2012. Patient states that she has been getting medication refills from Dr. Etter Sjogren every month. Pt did receive an RX from [facility] on 03/12/2013. Not sure how many as it is not stated. Unsure if this was strictly an inpatient pharmacy rx for while pt was inpatient. Advised pt if she needs refill of pain meds from her surgery she will need to contact her surgeon or she can go to ER for pain control, however, this RN is unable to call in RX for pain meds for her at 03:55 am on a Friday night. Did offer for pt to call back to [facility] office after 09:00 this AM to see if they can help her with her refill. She insisted that she has had a refill from this office every month since 08/2012, however, this is not in Epic chart that this nurse can see. Also, noted a call in to office on 02/24/2013 requesting pain med be refilled. Pt was then directed to her surgeon at that time as well by Dr. Etter Sjogren. Pt verbalized understanding and will call her surgeon. Protocol(s) Used: Medication Questions - Adult Recommended Outcome per Protocol: Call Dispensing Pharmacy or Provider Immediately Reason for Outcome: Requests refill of medication that poses clinical risk to patient if not available within 8 hours Care Advice: ~ 05/

## 2013-04-01 NOTE — Telephone Encounter (Signed)
Thought I ok this already

## 2013-04-01 NOTE — Telephone Encounter (Signed)
This is from 03/15/13      KP

## 2013-04-03 ENCOUNTER — Other Ambulatory Visit: Payer: Self-pay | Admitting: Family Medicine

## 2013-04-04 ENCOUNTER — Encounter: Payer: Self-pay | Admitting: Family Medicine

## 2013-04-04 ENCOUNTER — Ambulatory Visit (INDEPENDENT_AMBULATORY_CARE_PROVIDER_SITE_OTHER): Payer: BC Managed Care – PPO | Admitting: Family Medicine

## 2013-04-04 VITALS — BP 126/78 | HR 107 | Temp 98.7°F

## 2013-04-04 DIAGNOSIS — I10 Essential (primary) hypertension: Secondary | ICD-10-CM

## 2013-04-04 DIAGNOSIS — D649 Anemia, unspecified: Secondary | ICD-10-CM

## 2013-04-04 DIAGNOSIS — E876 Hypokalemia: Secondary | ICD-10-CM

## 2013-04-04 DIAGNOSIS — M79671 Pain in right foot: Secondary | ICD-10-CM

## 2013-04-04 DIAGNOSIS — R5383 Other fatigue: Secondary | ICD-10-CM

## 2013-04-04 DIAGNOSIS — E1159 Type 2 diabetes mellitus with other circulatory complications: Secondary | ICD-10-CM

## 2013-04-04 DIAGNOSIS — E871 Hypo-osmolality and hyponatremia: Secondary | ICD-10-CM

## 2013-04-04 DIAGNOSIS — M79609 Pain in unspecified limb: Secondary | ICD-10-CM

## 2013-04-04 DIAGNOSIS — L97509 Non-pressure chronic ulcer of other part of unspecified foot with unspecified severity: Secondary | ICD-10-CM

## 2013-04-04 DIAGNOSIS — E1049 Type 1 diabetes mellitus with other diabetic neurological complication: Secondary | ICD-10-CM

## 2013-04-04 DIAGNOSIS — R5381 Other malaise: Secondary | ICD-10-CM

## 2013-04-04 DIAGNOSIS — E041 Nontoxic single thyroid nodule: Secondary | ICD-10-CM

## 2013-04-04 DIAGNOSIS — E1169 Type 2 diabetes mellitus with other specified complication: Secondary | ICD-10-CM

## 2013-04-04 DIAGNOSIS — E785 Hyperlipidemia, unspecified: Secondary | ICD-10-CM

## 2013-04-04 DIAGNOSIS — I798 Other disorders of arteries, arterioles and capillaries in diseases classified elsewhere: Secondary | ICD-10-CM

## 2013-04-04 DIAGNOSIS — I2510 Atherosclerotic heart disease of native coronary artery without angina pectoris: Secondary | ICD-10-CM

## 2013-04-04 DIAGNOSIS — N390 Urinary tract infection, site not specified: Secondary | ICD-10-CM

## 2013-04-04 DIAGNOSIS — E1151 Type 2 diabetes mellitus with diabetic peripheral angiopathy without gangrene: Secondary | ICD-10-CM

## 2013-04-04 LAB — HEPATIC FUNCTION PANEL
Albumin: 3.7 g/dL (ref 3.5–5.2)
Alkaline Phosphatase: 58 U/L (ref 39–117)
Total Bilirubin: 0.6 mg/dL (ref 0.3–1.2)

## 2013-04-04 LAB — LIPID PANEL
HDL: 40 mg/dL (ref >39)
LDL Cholesterol: 50 mg/dL (ref 0–99)
Total CHOL/HDL Ratio: 2.8 Ratio
VLDL: 20 mg/dL (ref 0–40)

## 2013-04-04 LAB — CBC WITH DIFFERENTIAL/PLATELET
HCT: 33.2 % — ABNORMAL LOW (ref 36.0–46.0)
Hemoglobin: 10.8 g/dL — ABNORMAL LOW (ref 12.0–15.0)
Lymphocytes Relative: 27 % (ref 12–46)
Lymphs Abs: 1.6 10*3/uL (ref 0.7–4.0)
Monocytes Absolute: 0.6 10*3/uL (ref 0.1–1.0)
Monocytes Relative: 9 % (ref 3–12)
Neutro Abs: 3.7 10*3/uL (ref 1.7–7.7)
RBC: 4.21 MIL/uL (ref 3.87–5.11)
WBC: 6 10*3/uL (ref 4.0–10.5)

## 2013-04-04 LAB — BASIC METABOLIC PANEL
BUN: 14 mg/dL (ref 6–23)
Chloride: 106 mEq/L (ref 96–112)
Glucose, Bld: 97 mg/dL (ref 70–99)
Potassium: 4.2 mEq/L (ref 3.5–5.3)
Sodium: 141 mEq/L (ref 135–145)

## 2013-04-04 LAB — T3, FREE: T3, Free: 3.4 pg/mL (ref 2.3–4.2)

## 2013-04-04 LAB — HEMOGLOBIN A1C: Hgb A1c MFr Bld: 6.8 % — ABNORMAL HIGH (ref <5.7)

## 2013-04-04 LAB — POCT URINALYSIS DIPSTICK
Glucose, UA: NEGATIVE
Protein, UA: NEGATIVE
Spec Grav, UA: 1.01
Urobilinogen, UA: 0.2

## 2013-04-04 LAB — TSH: TSH: 2.934 u[IU]/mL (ref 0.350–4.500)

## 2013-04-04 MED ORDER — HYDROCODONE-IBUPROFEN 7.5-200 MG PO TABS: ORAL_TABLET | ORAL | Status: DC

## 2013-04-04 NOTE — Assessment & Plan Note (Signed)
Check labs Cont' meds

## 2013-04-04 NOTE — Assessment & Plan Note (Signed)
Check labs today.

## 2013-04-04 NOTE — Assessment & Plan Note (Signed)
Per ortho

## 2013-04-04 NOTE — Patient Instructions (Addendum)

## 2013-04-04 NOTE — Assessment & Plan Note (Signed)
Check labs 

## 2013-04-04 NOTE — Assessment & Plan Note (Signed)
Per endo We will have labs drawn

## 2013-04-04 NOTE — Assessment & Plan Note (Signed)
Check US thyroid Check labs

## 2013-04-04 NOTE — Progress Notes (Signed)
  Subjective:    [name], female    DOB: [birth year], 57 y.o.   MRN: [id number]  HPI Pt here for f/u from hosp for R foot debridement.  Pt is doing well since d/c.  She has f/u with Dr Loanne Drilling for DM but she would like her labs drawn here and he would like a nodule on her thyroid investigated.  Pt with no new complaints.  Hospital records reviewed.   Review of Systems     Objective:   Physical Exam BP 126/78  Pulse 107  Temp(Src) 98.7 F (37.1 C) (Oral)  SpO2 97% General appearance: alert, cooperative, appears stated age and no distress Throat: lips, mucosa, and tongue normal; teeth and gums normal Neck: no adenopathy, no carotid bruit, no JVD, supple, symmetrical, trachea midline and thyroid: R lobe nodule Lungs: clear to auscultation bilaterally Heart: S1, S2 normal       Assessment & Plan:

## 2013-04-04 NOTE — Assessment & Plan Note (Signed)
Stable con't meds 

## 2013-04-04 NOTE — Assessment & Plan Note (Signed)
Check labs con't meds 

## 2013-04-05 LAB — MICROALBUMIN / CREATININE URINE RATIO: Microalb, Ur: 1.67 mg/dL (ref 0.00–1.89)

## 2013-04-11 ENCOUNTER — Ambulatory Visit (HOSPITAL_BASED_OUTPATIENT_CLINIC_OR_DEPARTMENT_OTHER)
Admission: RE | Admit: 2013-04-11 | Discharge: 2013-04-11 | Disposition: A | Discharge status: Home / Self Care | Payer: BC Managed Care – PPO | Source: Ambulatory Visit | Attending: Family Medicine | Admitting: Family Medicine

## 2013-04-11 DIAGNOSIS — R1310 Dysphagia, unspecified: Secondary | ICD-10-CM | POA: Insufficient documentation

## 2013-04-11 DIAGNOSIS — E042 Nontoxic multinodular goiter: Secondary | ICD-10-CM | POA: Insufficient documentation

## 2013-04-11 DIAGNOSIS — E041 Nontoxic single thyroid nodule: Secondary | ICD-10-CM

## 2013-04-16 ENCOUNTER — Ambulatory Visit (INDEPENDENT_AMBULATORY_CARE_PROVIDER_SITE_OTHER): Payer: BC Managed Care – PPO | Admitting: Endocrinology

## 2013-04-16 ENCOUNTER — Encounter: Payer: Self-pay | Admitting: Endocrinology

## 2013-04-16 VITALS — BP 130/80 | HR 78

## 2013-04-16 DIAGNOSIS — E042 Nontoxic multinodular goiter: Secondary | ICD-10-CM

## 2013-04-16 NOTE — Patient Instructions (Addendum)
check your blood sugar twice a day.  vary the time of day when you check, between before the 3 meals, and at bedtime.  also check if you have symptoms of your blood sugar being too high or too low.  please keep a record of the readings and bring it to your next appointment here.  please call us sooner if your blood sugar goes below 70, or if you have a lot of readings over 200. please increase apidra to 15 units with breakfast, and 15 units with the evening meal.   Please reduce the lantus to 20 units at bedtime.  Please come back for a follow-up appointment in 3 months.   Please plan to recheck the thyroid ultrasound in approx 1 year The thyroid is not big enough to cause the swallowing problem.  You should consider other causes (keeping in mind, sometimes we are not able to find the cause of some symptoms).
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
## 2013-04-17 DIAGNOSIS — E042 Nontoxic multinodular goiter: Secondary | ICD-10-CM | POA: Insufficient documentation

## 2013-04-21 ENCOUNTER — Other Ambulatory Visit: Payer: Self-pay | Admitting: Family Medicine

## 2013-04-22 MED ORDER — LISINOPRIL 5 MG PO TABS: ORAL_TABLET | ORAL | Status: DC

## 2013-04-22 NOTE — Telephone Encounter (Signed)
Last seen 04/04/13 and Lisinopril is not on her medication list. Her last visit BP were 04/04/13 -- 126/78, 02/21/13 --112/66 and 01/10/13-- 118/80. Please advise if this refill is appropriate, Lisinopril was last written 11/03/11 #90 with 3 refills.  Please advise if refill is appropriate     KP

## 2013-04-22 NOTE — Telephone Encounter (Signed)
We may need to ask the pt because I don't see where it was d/c'd ----someone outside [facility] may have added it again.

## 2013-04-22 NOTE — Telephone Encounter (Signed)
Reviewed chart and Rx was sent with a quantity of #5 by the provider in the ED, per Dr.Lowne it is ok to refill the medication.    KP

## 2013-04-22 NOTE — Addendum Note (Signed)
Addended by: Ewing Schlein on: 04/22/2013 09:33 AM   Modules accepted: Orders

## 2013-04-23 ENCOUNTER — Telehealth: Payer: Self-pay | Admitting: *Deleted

## 2013-04-23 NOTE — Telephone Encounter (Signed)
04-04-13 Last OV, Last refilled#50

## 2013-04-23 NOTE — Telephone Encounter (Signed)
Too soon

## 2013-04-24 NOTE — Telephone Encounter (Signed)
discussed with patient and she voiced understanding, I made her aware that she is still in pain she needs to speak with the doctor who performed the surgery and get his pain the pain med's from her per Dr.Lowne's previous note, she stated she went there on Tuesday and was unable to get the pain med's because he was not in the office, she is going to see him today and she will ask him for pain med's today. She asked when should she call for refill, I made her aware the med's are as needed not to take regularly and she said she is always in pain she stated that she is not having the foot pain but she is having abdominal pain. I made her aware they have been declined because they are requested too soon. She voiced understanding and the call ended.    KP

## 2013-06-05 ENCOUNTER — Other Ambulatory Visit: Payer: Self-pay | Admitting: Family Medicine

## 2013-06-10 ENCOUNTER — Other Ambulatory Visit: Payer: Self-pay | Admitting: Family Medicine

## 2013-07-13 ENCOUNTER — Other Ambulatory Visit: Payer: Self-pay | Admitting: Family Medicine

## 2013-07-18 ENCOUNTER — Ambulatory Visit (INDEPENDENT_AMBULATORY_CARE_PROVIDER_SITE_OTHER): Payer: BC Managed Care – PPO | Admitting: Endocrinology

## 2013-07-18 ENCOUNTER — Encounter: Payer: Self-pay | Admitting: Endocrinology

## 2013-07-18 VITALS — BP 130/70 | HR 80

## 2013-07-18 DIAGNOSIS — L97509 Non-pressure chronic ulcer of other part of unspecified foot with unspecified severity: Secondary | ICD-10-CM

## 2013-07-18 DIAGNOSIS — E11621 Type 2 diabetes mellitus with foot ulcer: Secondary | ICD-10-CM

## 2013-07-18 DIAGNOSIS — E042 Nontoxic multinodular goiter: Secondary | ICD-10-CM

## 2013-07-18 DIAGNOSIS — E1169 Type 2 diabetes mellitus with other specified complication: Secondary | ICD-10-CM
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
## 2013-07-18 NOTE — Patient Instructions (Addendum)
check your blood sugar twice a day.  vary the time of day when you check, between before the 3 meals, and at bedtime.  also check if you have symptoms of your blood sugar being too high or too low.  please keep a record of the readings and bring it to your next appointment here.  please call us sooner if your blood sugar goes below 70, or if you have a lot of readings over 200. please increase apidra to 15 units with breakfast, and 15 units with the evening meal.   Please reduce the lantus to 20 units at bedtime.  Please come back for a follow-up appointment in 4 months.   blood tests are being requested for you today.  We'll contact you with results.  If it is high, we'll add a few units of apidra at lunch.

## 2013-09-15 ENCOUNTER — Other Ambulatory Visit: Payer: Self-pay | Admitting: Family Medicine

## 2013-09-19 ENCOUNTER — Encounter: Payer: Self-pay | Admitting: Family Medicine

## 2013-09-19 ENCOUNTER — Ambulatory Visit (INDEPENDENT_AMBULATORY_CARE_PROVIDER_SITE_OTHER): Payer: BC Managed Care – PPO | Admitting: Family Medicine

## 2013-09-19 VITALS — BP 130/70 | HR 94 | Temp 96.8°F | Wt 160.0 lb

## 2013-09-19 DIAGNOSIS — I776 Arteritis, unspecified: Secondary | ICD-10-CM

## 2013-09-19 DIAGNOSIS — R21 Rash and other nonspecific skin eruption: Secondary | ICD-10-CM

## 2013-09-19 LAB — CBC WITH DIFFERENTIAL/PLATELET
Basophils Absolute: 0 10*3/uL (ref 0.0–0.1)
Eosinophils Relative: 2 % (ref 0–5)
HCT: 35.2 % — ABNORMAL LOW (ref 36.0–46.0)
Hemoglobin: 12.1 g/dL (ref 12.0–15.0)
Lymphocytes Relative: 27 % (ref 12–46)
Lymphs Abs: 1.7 10*3/uL (ref 0.7–4.0)
MCH: 28.2 pg (ref 26.0–34.0)
MCV: 82.1 fL (ref 78.0–100.0)
Monocytes Absolute: 0.5 10*3/uL (ref 0.1–1.0)
Neutro Abs: 3.8 10*3/uL (ref 1.7–7.7)
RBC: 4.29 MIL/uL (ref 3.87–5.11)
RDW: 14.2 % (ref 11.5–15.5)
WBC: 6.1 10*3/uL (ref 4.0–10.5)

## 2013-09-19 LAB — BASIC METABOLIC PANEL
Calcium: 9.5 mg/dL (ref 8.4–10.5)
Glucose, Bld: 105 mg/dL — ABNORMAL HIGH (ref 70–99)
Potassium: 4.3 mEq/L (ref 3.5–5.3)
Sodium: 140 mEq/L (ref 135–145)

## 2013-09-19 LAB — HEMOGLOBIN A1C
Hgb A1c MFr Bld: 7.3 % — ABNORMAL HIGH (ref <5.7)
Mean Plasma Glucose: 163 mg/dL — ABNORMAL HIGH (ref <117)

## 2013-09-19 LAB — HEPATIC FUNCTION PANEL
ALT: 11 U/L (ref 0–35)
Alkaline Phosphatase: 76 U/L (ref 39–117)
Bilirubin, Direct: 0.2 mg/dL (ref 0.0–0.3)
Indirect Bilirubin: 0.4 mg/dL (ref 0.0–0.9)
Total Protein: 7.2 g/dL (ref 6.0–8.3)

## 2013-09-19 MED ORDER — PREDNISONE 10 MG PO TABS: ORAL_TABLET | ORAL | Status: DC

## 2013-09-19 NOTE — Progress Notes (Signed)
Pre visit review using our clinic review tool, if applicable. No additional management support is needed unless otherwise documented below in the visit note. 

## 2013-09-19 NOTE — Patient Instructions (Signed)
Vasculitis Vasculitis is when your blood vessels are inflamed. There are many different blood vessels in the body, and vasculitis can affect any of them. This includes large (veins and arteries) and small (capillaries) vessels. With vasculitis,   Blood vessel walls can become thick.  Blood vessels can become narrow.  Blood vessels can become weak. Sometimes, it becomes so weak that the blood vessel bulges out like a balloon. This is called an aneurysm. Aneurysms are rare but can be life-threatening.  Scarring can occur.  Not enough blood can flow through the blood vessels. All of these things can damage many parts of the body, including the muscles, kidneys, lungs and brain. There are many types of vasculitis. Some types are short-term (acute), while others are long-term (chronic). Some types may go away without treatment, and others may need to be treated for a long time.  CAUSES  Vasculitis occurs when the body's immune system (which fights germs and disease) makes a mistake. It attacks its own blood vessels. This causes inflammation (the body's way of reacting to injury or infection).   Why this happens is usually not known. The condition is then called primary vasculitis.  Sometimes, something triggers the inflammation. This is called secondary vasculitis. Possible causes include:  Infections.  An immune system disease. Examples include lupus, rheumatoid arthritis and scleroderma.  An allergic reaction to a medicine.  Cancer that affects blood cells. This includes leukemia and lymphoma.  Males and females of all ages and races can develop vasculitis. Some risk factors make vasculitis more likely. These include:  Smoking.  Stress.  Physical injury. SYMPTOMS  There are more than 20 types of vasculitis. Symptoms of each type vary, but some symptoms are common.  Many people with vasculitis:  Have a fever.  Do not feel like eating.  Lose weight.  Feel very tired.  Have  aches and pains.  Feel weak.  Start to not have feeling (numbness) in an area.  Symptoms for some types of vasculitis also could be:  Sores in the mouth or eyes.  Skin problems. This could be sores, spots or rashes.  Trouble seeing.  Trouble breathing.  Blood in the urine.  Headaches.  Pain in the abdomen.  Stuffy or bloody nose. DIAGNOSIS  Vasculitis symptoms are similar to symptoms of many other conditions. That can make it hard to tell if you have vasculitis. To be sure, your caregiver will ask about your symptoms and do a physical exam. Certain tests may be necessary, such as:   A complete blood count (CBC). This test shows how many red blood cells are in your blood. Not having enough red blood cells (anemic) can result from vasculitis.  Erythrocyte sedimentation (also called sed rate test). It measures inflammation in the body.  C reactive protein (CRP). This also shows if there is inflammation.  Anti-neutrophil cytoplasmic antibodies (ANCA). This can tell if the immune system is reacting to certain cells in the blood.  A urine test. This checks for blood or protein in the urine. That could be a sign of kidney damage from vasculitis.  Imaging tests. These tests create pictures from inside the body. Options include:  X-rays.  Computed tomography (CT) scan. This uses X-rays guided by a computer.  Ultrasound. It creates an image using sound waves.  Magnetic resonance imaging (MRI). It uses radio waves, magnets and a computer.  Angiography. A dye is put into your blood vessels. Then, an X-ray is taken of them.  A biopsy of  a blood vessel. This means your caregiver will take out a small piece of a blood vessel. Then, it is checked under a microscope. This is an important test. It often is the best way to know for sure if you have vasculitis. TREATMENT   Treatment will depend on the type of vasculitis and how severe it is. Often, you will need to see a specialist in  immunologic diseases (rheumatologist).  Some types of vasculitis may go away without treatment.  Some types need only over-the-counter drugs.  Prescription medicines are used to treat many types of vasculitis. For example:  Corticosteroids. These are the drugs used most often. They are very powerful. Usually, a high dose is taken until symptoms improve. Then, the dose is gradually decreased. Using corticosteroids for a long time can cause problems. They can make muscles and bones weak. They can cause blood pressure to go up, and cause diabetes. Also, people often gain weight when they take corticosteroids.  Cytotoxic drugs. These kill cells that cause inflammation. Sometimes, they are used if corticosteroids do not help. Other times, both medications are taken.  Surgery. This may be needed to repair a blood vessel that has bulged out (aneurysm).  Treatment can sometimes cure your disease. Other times, it can put the disease in remission (no symptoms). Increased treatment and reevaluation might be necessary if your disease comes back or flares. HOME CARE INSTRUCTIONS   Take any medications that your caregiver prescribes. Follow the directions carefully.  Watch for any problems that can be caused by a drug (side effects). Tell your caregiver right away if you notice any changes or problems.  Keep all appointments for checkups. This is important to help your caregiver watch for side effects. Checkups may include:  Periodic blood tests.  Bone density testing. This checks how strong or weak your bones are.  Blood pressure checks. If your blood pressure rises, you may need to take a drug to control it while you are taking corticosteroids.  Blood sugar checks. This is to be sure you are not developing diabetes. If you have diabetes, corticosteroid medications may make it worse and require increased treatment.  Exercise. First, talk with your caregiver about what would be OK for you to do.  Aerobic exercise (which increases your heart rate) is often suggested. It includes walking. This type of exercise is good because it helps prevent bone loss. It also helps control your blood pressure.  Follow a healthy diet. Include good sources of protein in your diet. Also include fruits, vegetables and whole grains. Your caregiver can refer you to an expert on healthy eating (dietitian) for more detailed advice.  Learn as much as you can about vasculitis. Understanding your condition can help you cope with it. Coping can be hard because this may be something you will have to live with for years.  Consider joining a support group. It often helps to talk about your worries with others who have the same problems.  Tell your caregiver if you feel stressed, anxious or depressed. Your caregiver may refer you to a specialist, or recommend medication to relieve your symptoms. SEEK MEDICAL CARE IF:   The symptoms that led to your diagnosis return.  You develop worsening fever, fatigue, headache, weight loss or pain in your jaw.  You develop signs of infection. Infections can be worse if you are on corticosteroid medication.  You develop any new or unexplained symptoms of disease. SEEK IMMEDIATE MEDICAL CARE IF:   Your eyesight changes.  Pain does not go away, even after taking medication.  You feel pain in your chest or abdomen.  You have trouble breathing.  One side of your face or body becomes suddenly weak or numb.  Your nose bleeds.  There is blood in your urine.  You develop a fever of more than 102 F (38.9 C). Document Released: 08/12/2009 Document Revised: 01/08/2012 Document Reviewed: 08/12/2009 [facility] Patient Information 2014 [place].

## 2013-09-20 DIAGNOSIS — E669 Obesity, unspecified: Secondary | ICD-10-CM | POA: Insufficient documentation

## 2013-09-20 DIAGNOSIS — I776 Arteritis, unspecified: Secondary | ICD-10-CM | POA: Insufficient documentation

## 2013-09-20 LAB — SEDIMENTATION RATE: Sed Rate: 30 mm/hr — ABNORMAL HIGH (ref 0–22)

## 2013-09-20 NOTE — Progress Notes (Signed)
  Subjective:     [name] [name] is a 57 y.o. female who presents for evaluation of a rash involving the leg. Rash started several days ago. Lesions are pink, and flat in texture. Rash has changed over time. Rash is pruritic. Associated symptoms: abdominal pain, arthralgia, congestion, cough, crankiness, decrease in appetite, decrease in energy level, fever, headache, irritability, myalgia, nausea, sore throat and vomiting. Patient denies: abdominal pain, arthralgia, congestion, cough, crankiness, decrease in appetite, decrease in energy level, fever, headache, irritability, myalgia, nausea, sore throat and vomiting. Patient has not had contacts with similar rash. Patient has not had new exposures (soaps, lotions, laundry detergents, foods, medications, plants, insects or animals).  The following portions of the patient's history were reviewed and updated as appropriate: allergies, current medications, past family history, past medical history, past social history, past surgical history and problem list.  Review of Systems Pertinent items are noted in HPI.    Objective:    BP 130/70  Pulse 94  Temp(Src) 96.8 F (36 C) (Tympanic)  Wt 160 lb (72.576 kg)  SpO2 96% General:  alert, cooperative, appears stated age and no distress  Skin:  rash noted on legs b/l L > right----+ vasculitis     Assessment:    vasculitis    Plan:    Medications: steroids: pred taper. Written patient instruction given. Follow up in a few days. ---prn

## 2013-09-20 NOTE — Assessment & Plan Note (Signed)
Check labs Steroid taper Consider derm referral if no improvement

## 2013-09-22 ENCOUNTER — Other Ambulatory Visit: Payer: Self-pay | Admitting: Family Medicine

## 2013-09-22 DIAGNOSIS — R768 Other specified abnormal immunological findings in serum: Secondary | ICD-10-CM

## 2013-09-22 LAB — ANTI-NUCLEAR AB-TITER (ANA TITER): ANA Titer 1: 1:80 {titer} — ABNORMAL HIGH

## 2013-10-02 ENCOUNTER — Telehealth: Payer: Self-pay | Admitting: Family Medicine

## 2013-10-02 DIAGNOSIS — R894 Abnormal immunological findings in specimens from other organs, systems and tissues: Secondary | ICD-10-CM

## 2013-10-02 NOTE — Telephone Encounter (Signed)
hold

## 2013-10-02 NOTE — Telephone Encounter (Signed)
Patient is calling to request a referral to a rheumatologist at [facility], Dr. Durward Fortes [phone]. She states that she was unhappy with the care her last Rheumatologist provided and wants to change. She called Dr. Ernestene Mention office and was told that she needed to be referred. Please advise.

## 2013-10-06 ENCOUNTER — Encounter: Payer: Self-pay | Admitting: Family Medicine

## 2013-10-06 ENCOUNTER — Ambulatory Visit (INDEPENDENT_AMBULATORY_CARE_PROVIDER_SITE_OTHER): Payer: BC Managed Care – PPO | Admitting: Family Medicine

## 2013-10-06 VITALS — BP 132/76 | HR 89 | Temp 98.5°F | Wt 174.4 lb

## 2013-10-06 DIAGNOSIS — R079 Chest pain, unspecified: Secondary | ICD-10-CM

## 2013-10-06 DIAGNOSIS — I252 Old myocardial infarction: Secondary | ICD-10-CM

## 2013-10-06 DIAGNOSIS — R112 Nausea with vomiting, unspecified: Secondary | ICD-10-CM

## 2013-10-06 DIAGNOSIS — R894 Abnormal immunological findings in specimens from other organs, systems and tissues: Secondary | ICD-10-CM

## 2013-10-06 DIAGNOSIS — R768 Other specified abnormal immunological findings in serum: Secondary | ICD-10-CM | POA: Insufficient documentation

## 2013-10-06 DIAGNOSIS — M2550 Pain in unspecified joint: Secondary | ICD-10-CM

## 2013-10-06 DIAGNOSIS — R7689 Other specified abnormal immunological findings in serum: Secondary | ICD-10-CM

## 2013-10-06 HISTORY — DX: Old myocardial infarction: I25.2

## 2013-10-06 MED ORDER — PREDNISONE 5 MG PO TABS: 5.0000 mg | ORAL_TABLET | Freq: Every day | ORAL | Status: DC

## 2013-10-06 MED ORDER — ONDANSETRON 4 MG PO TBDP: 4.0000 mg | ORAL_TABLET | Freq: Three times a day (TID) | ORAL | Status: DC | PRN

## 2013-10-06 MED ORDER — PREDNISONE 10 MG PO TABS: ORAL_TABLET | ORAL | Status: DC

## 2013-10-06 MED ORDER — PROMETHAZINE HCL 25 MG/ML IJ SOLN
25.0000 mg | Freq: Once | INTRAMUSCULAR | Status: AC
  Administered 2013-10-06: 25 mg via INTRAMUSCULAR

## 2013-10-06 NOTE — Assessment & Plan Note (Signed)
Phenergan IM zofran If symptoms con't go to ER for IVF

## 2013-10-06 NOTE — Progress Notes (Signed)
  Subjective:     [name] [name] is a 57 y.o. female who presents for evaluation of a rash involving the face and leg. Rash started several weeks ago. Lesions are pink, and flat in texture. Rash has changed over time. Rash causes no discomfort. Associated symptoms: abdominal pain, arthralgia, decrease in appetite, headache, myalgia, nausea and vomiting. Patient denies: sore throat. Patient has not had contacts with similar rash. Patient has not had new exposures (soaps, lotions, laundry detergents, foods, medications, plants, insects or animals).----it did improve with prednisone given last visit.  Pt has appointment with rheum in January.   She is also c/o chest pain  The following portions of the patient's history were reviewed and updated as appropriate: allergies, current medications, past family history, past medical history, past social history, past surgical history and problem list.  Review of Systems Pertinent items are noted in HPI.    Objective:    BP 132/76  Pulse 89  Temp(Src) 98.5 F (36.9 C) (Oral)  Wt 174 lb 6.4 oz (79.107 kg)  SpO2 97% General:  alert, cooperative, appears stated age and mild distress  Skin:  vasculitis low ext has improved--- she now has papular rash on cheeks-     Cor--+s1S2 Lungs--CTAB/L  No rrw abd-- soft,  No pain  Assessment:    vasculitis    Plan:    Medications: steroids: pred taper and keep on 8m until rheum. verbal and written patient instruction given. Follow up in a few weeks. --prn--- rheum has pt on wait list to come in sooner

## 2013-10-06 NOTE — Patient Instructions (Signed)

## 2013-10-06 NOTE — Assessment & Plan Note (Signed)
With hx MI and new chest pain even with neg ekg Refer to cardiology for eval

## 2013-10-06 NOTE — Assessment & Plan Note (Signed)
Suspect Lupus Rheum appointment pending

## 2013-10-06 NOTE — Progress Notes (Signed)
Pre visit review using our clinic review tool, if applicable. No additional management support is needed unless otherwise documented below in the visit note. 

## 2013-10-10 ENCOUNTER — Ambulatory Visit: Payer: BC Managed Care – PPO | Admitting: Cardiovascular Disease

## 2013-10-14 ENCOUNTER — Other Ambulatory Visit: Payer: Self-pay | Admitting: Family Medicine

## 2013-10-25 ENCOUNTER — Other Ambulatory Visit: Payer: Self-pay | Admitting: Family Medicine

## 2013-11-04 ENCOUNTER — Ambulatory Visit (INDEPENDENT_AMBULATORY_CARE_PROVIDER_SITE_OTHER): Payer: BC Managed Care – PPO | Admitting: Endocrinology

## 2013-11-04 ENCOUNTER — Encounter: Payer: Self-pay | Admitting: Endocrinology

## 2013-11-04 VITALS — BP 126/78 | HR 84 | Temp 98.3°F | Ht 60.0 in

## 2013-11-04 DIAGNOSIS — E1049 Type 1 diabetes mellitus with other diabetic neurological complication: Secondary | ICD-10-CM

## 2013-11-04 MED ORDER — INSULIN ASPART 100 UNIT/ML FLEXPEN: PEN_INJECTOR | SUBCUTANEOUS | Status: DC

## 2013-11-04 NOTE — Patient Instructions (Addendum)
check your blood sugar twice a day.  vary the time of day when you check, between before the 3 meals, and at bedtime.  also check if you have symptoms of your blood sugar being too high or too low.  please keep a record of the readings and bring it to your next appointment here.  please call us sooner if your blood sugar goes below 70, or if you have a lot of readings over 200. please increase apidra to 15 units with breakfast, and 16 units with the evening meal.   Please come back for a follow-up appointment in 3 months.
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
## 2013-11-11 ENCOUNTER — Ambulatory Visit (INDEPENDENT_AMBULATORY_CARE_PROVIDER_SITE_OTHER): Payer: BC Managed Care – PPO | Admitting: Family Medicine

## 2013-11-11 ENCOUNTER — Encounter: Payer: Self-pay | Admitting: Family Medicine

## 2013-11-11 VITALS — BP 118/84 | HR 85 | Temp 98.5°F | Resp 16

## 2013-11-11 DIAGNOSIS — R21 Rash and other nonspecific skin eruption: Secondary | ICD-10-CM

## 2013-11-11 DIAGNOSIS — M79603 Pain in arm, unspecified: Secondary | ICD-10-CM

## 2013-11-11 DIAGNOSIS — F411 Generalized anxiety disorder: Secondary | ICD-10-CM

## 2013-11-11 DIAGNOSIS — M79606 Pain in leg, unspecified: Secondary | ICD-10-CM | POA: Insufficient documentation

## 2013-11-11 DIAGNOSIS — J329 Chronic sinusitis, unspecified: Secondary | ICD-10-CM

## 2013-11-11 DIAGNOSIS — M79609 Pain in unspecified limb: Secondary | ICD-10-CM

## 2013-11-11 DIAGNOSIS — J019 Acute sinusitis, unspecified: Secondary | ICD-10-CM

## 2013-11-11 MED ORDER — GUAIFENESIN-CODEINE 100-10 MG/5ML PO SYRP: ORAL_SOLUTION | ORAL | Status: DC

## 2013-11-11 MED ORDER — LEVOFLOXACIN 500 MG PO TABS: 500.0000 mg | ORAL_TABLET | Freq: Every day | ORAL | Status: DC

## 2013-11-11 MED ORDER — FLUTICASONE PROPIONATE 50 MCG/ACT NA SUSP: 2.0000 | Freq: Every day | NASAL | Status: DC

## 2013-11-11 NOTE — Progress Notes (Signed)
Pre visit review using our clinic review tool, if applicable. No additional management support is needed unless otherwise documented below in the visit note. 

## 2013-11-11 NOTE — Assessment & Plan Note (Signed)
Secondary to burning sensation in arms and legs that is new rheum advised she see neuro He felt + ANA was false positive

## 2013-11-11 NOTE — Assessment & Plan Note (Signed)
Suspected vasculitis but it has improved and rheum said it was not vasculitis

## 2013-11-11 NOTE — Progress Notes (Signed)
   Subjective:    [name], female    DOB: 1956-08-30, 58 y.o.   MRN: [id number]  HPI Pt here f/u rheum ov--- pt had + ANA and what was thought might be a vasculitis so pt was sent to Rheum to evaluate.  Pt states " the rhem did nothing but look at labs and say it was not lupus" or "vasculitis".  Pt states Dr did not touch her.  Pt was upset with [facility].  Because of the inc sensitivity in skin and joint pains it was recommended she see Neurology.      Review of Systems As above    Objective:   Physical Exam BP 118/84  Pulse 85  Temp(Src) 98.5 F (36.9 C) (Oral)  Resp 16  SpO2 95% General appearance: alert, cooperative, appears stated age and no distress Extremities: pain in feet, legs an upper legs Skin: red spots low ext Ears: normal TM's and external ear canals both ears Nose: green discharge, moderate congestion, turbinates red, swollen, sinus tenderness bilateral Throat: lips, mucosa, and tongue normal; teeth and gums normal Neck: moderate anterior cervical adenopathy, supple, symmetrical, trachea midline and thyroid not enlarged, symmetric, no tenderness/mass/nodules Lungs: clear to auscultation bilaterally Heart: S1, S2 normal        Assessment & Plan:

## 2013-11-11 NOTE — Assessment & Plan Note (Signed)
levaquin per orders Nasal spray rto prn

## 2013-11-11 NOTE — Patient Instructions (Signed)

## 2013-11-12 LAB — LUPUS ANTICOAGULANT PANEL
DRVVT: 31 s (ref <42.9)
Lupus Anticoagulant: NOT DETECTED
PTT Lupus Anticoagulant: 32.4 secs (ref 28.0–43.0)

## 2013-11-13 ENCOUNTER — Other Ambulatory Visit: Payer: Self-pay | Admitting: Family Medicine

## 2013-11-13 ENCOUNTER — Ambulatory Visit (HOSPITAL_BASED_OUTPATIENT_CLINIC_OR_DEPARTMENT_OTHER)
Admission: RE | Admit: 2013-11-13 | Discharge: 2013-11-13 | Disposition: A | Discharge status: Home / Self Care | Payer: BC Managed Care – PPO | Source: Ambulatory Visit | Attending: Family Medicine | Admitting: Family Medicine

## 2013-11-13 ENCOUNTER — Other Ambulatory Visit (HOSPITAL_BASED_OUTPATIENT_CLINIC_OR_DEPARTMENT_OTHER): Payer: BC Managed Care – PPO

## 2013-11-13 DIAGNOSIS — M79603 Pain in arm, unspecified: Secondary | ICD-10-CM

## 2013-11-13 DIAGNOSIS — M79606 Pain in leg, unspecified: Secondary | ICD-10-CM

## 2013-11-13 DIAGNOSIS — M79609 Pain in unspecified limb: Secondary | ICD-10-CM | POA: Insufficient documentation

## 2013-11-14 ENCOUNTER — Telehealth: Payer: Self-pay | Admitting: *Deleted

## 2013-11-14 NOTE — Telephone Encounter (Signed)
Patient called and stated that her symptoms are getting worse.Patient states that the rash is spreading up her legs. Patient is also complaining of headaches. Patient would like to know if something else needs to be done. Please advise. SW

## 2013-11-14 NOTE — Telephone Encounter (Signed)
Was neuro referral done?  We can work on getting it done Monday--- if symptoms are worse, sat clinic or [facility]

## 2013-11-14 NOTE — Telephone Encounter (Signed)
patient has been made aware and she voiced understanding, she agreed to go to [facility] or Sat clinic if symptoms worsen. [place] can you check the status of this referral       KP

## 2013-11-17 NOTE — Telephone Encounter (Signed)
Patient already has appt scheduled for 12/12/13 with Dr. Posey Pronto for upper and lower extremity pain. It does not say anything about headaches in the referral.

## 2013-11-20 ENCOUNTER — Other Ambulatory Visit: Payer: Self-pay | Admitting: Family Medicine

## 2013-12-10 ENCOUNTER — Telehealth: Payer: Self-pay

## 2013-12-10 NOTE — Telephone Encounter (Signed)
Spoke with patient who states that she had pressed robot call to cancel. No one called her back to confirm that she had cancelled.

## 2013-12-11 ENCOUNTER — Encounter: Payer: BC Managed Care – PPO | Admitting: Family Medicine

## 2013-12-12 ENCOUNTER — Encounter: Payer: Self-pay | Admitting: Neurology

## 2013-12-12 ENCOUNTER — Ambulatory Visit (INDEPENDENT_AMBULATORY_CARE_PROVIDER_SITE_OTHER): Payer: BC Managed Care – PPO | Admitting: Neurology

## 2013-12-12 VITALS — BP 136/74 | HR 84 | Ht 60.0 in | Wt 135.0 lb

## 2013-12-12 DIAGNOSIS — R209 Unspecified disturbances of skin sensation: Secondary | ICD-10-CM

## 2013-12-12 DIAGNOSIS — R52 Pain, unspecified: Secondary | ICD-10-CM
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
## 2013-12-12 NOTE — Patient Instructions (Addendum)
1.  MRI brain 2.  EMG of left side  90 minutes 3.  Return to clinic in 6 weeks  [place] (EMG/NCS) INSTRUCTIONS  How to Prepare The neurologist conducting the EMG will need to know if you have certain medical conditions. Tell the neurologist and other EMG lab personnel if you:   Have a pacemaker or any other electrical medical device   Take blood-thinning medications   Have hemophilia, a blood-clotting disorder that causes prolonged bleeding  Bathing Take a shower or bath shortly before your exam in order to remove oils from your skin. Don't apply lotions or creams before the exam.  What to Expect You'll likely be asked to change into a hospital gown for the procedure and lie down on an examination table. The following explanations can help you understand what will happen during the exam.    Electrodes. The neurologist or a technician places surface electrodes at various locations on your skin depending on where you're experiencing symptoms. Or the neurologist may insert needle electrodes at different sites depending on your symptoms.    Sensations. The electrodes will at times transmit a tiny electrical current that you may feel as a twinge or spasm. The needle electrode may cause discomfort or pain that usually ends shortly after the needle is removed. If you are concerned about discomfort or pain, you may want to talk to the neurologist about taking a short break during the exam.    Instructions. During the needle EMG, the neurologist will assess whether there is any spontaneous electrical activity when the muscle is at rest - activity that isn't present in healthy muscle tissue - and the degree of activity when you slightly contract the muscle.  He or she will give you instructions on resting and contracting a muscle at appropriate times. Depending on what muscles and nerves the neurologist is examining, he or she may ask you to change positions during the  exam.  After your EMG You may experience some temporary, minor bruising where the needle electrode was inserted into your muscle. This bruising should fade within several days. If it persists, contact your primary care doctor.    I have scheduled the MRI/brain at [facility] for Monday, February 23 at 7:00 pm.  Please arrive at 6:45 pm for registration.

## 2013-12-15 ENCOUNTER — Other Ambulatory Visit: Payer: Self-pay | Admitting: Family Medicine

## 2013-12-17 NOTE — Telephone Encounter (Signed)
Last seen 11/11/13 and filled 10/06/13 #30 with 1 refill. Please advise      KP

## 2013-12-22 ENCOUNTER — Ambulatory Visit (HOSPITAL_COMMUNITY)
Admission: RE | Admit: 2013-12-22 | Discharge: 2013-12-22 | Disposition: A | Discharge status: Home / Self Care | Payer: BC Managed Care – PPO | Source: Ambulatory Visit | Attending: Neurology | Admitting: Neurology

## 2013-12-22 DIAGNOSIS — R51 Headache: Secondary | ICD-10-CM | POA: Insufficient documentation

## 2013-12-22 DIAGNOSIS — R262 Difficulty in walking, not elsewhere classified: Secondary | ICD-10-CM | POA: Insufficient documentation

## 2013-12-22 DIAGNOSIS — R52 Pain, unspecified: Secondary | ICD-10-CM

## 2013-12-22 DIAGNOSIS — R209 Unspecified disturbances of skin sensation: Secondary | ICD-10-CM | POA: Insufficient documentation

## 2013-12-23 ENCOUNTER — Telehealth: Payer: Self-pay | Admitting: Neurology

## 2013-12-23 NOTE — Telephone Encounter (Signed)
Called and informed patient of MRI brain results which shows no acute abnormalities, posterior fossa arachnoid cyst is stable when compared to 2008 and likely incidental finding.  She was already aware of it.  EMG scheduled for March to evaluate arm symptoms.  Yovana Scogin K. Posey Pronto, DO

## 2014-01-09 ENCOUNTER — Ambulatory Visit (INDEPENDENT_AMBULATORY_CARE_PROVIDER_SITE_OTHER): Payer: BC Managed Care – PPO | Admitting: Family Medicine

## 2014-01-09 VITALS — BP 140/76 | HR 88 | Temp 98.7°F

## 2014-01-09 DIAGNOSIS — R21 Rash and other nonspecific skin eruption: Secondary | ICD-10-CM

## 2014-01-09 DIAGNOSIS — J329 Chronic sinusitis, unspecified: Secondary | ICD-10-CM

## 2014-01-09 DIAGNOSIS — F411 Generalized anxiety disorder: Secondary | ICD-10-CM

## 2014-01-09 MED ORDER — PREDNISONE 10 MG PO TABS: ORAL_TABLET | ORAL | Status: DC

## 2014-01-09 MED ORDER — METHYLPREDNISOLONE ACETATE 40 MG/ML IJ SUSP: 80.0000 mg | Freq: Once | INTRAMUSCULAR | Status: DC

## 2014-01-09 MED ORDER — METHYLPREDNISOLONE ACETATE 80 MG/ML IJ SUSP
80.0000 mg | Freq: Once | INTRAMUSCULAR | Status: AC
  Administered 2014-01-09: 80 mg via INTRAMUSCULAR

## 2014-01-09 NOTE — Progress Notes (Signed)
  Subjective:     [name] [name] is a 58 y.o. female who presents for evaluation of sinus pain. Symptoms include: headaches, nasal congestion, purulent rhinorrhea and sinus pressure. Onset of symptoms was 1 week ago. Symptoms have been gradually worsening since that time. Past history is significant for no history of pneumonia or bronchitis. Patient is a non-smoker.  Pt is also complaining about rash on legs and mult joint pains.  It has occurred before and went away with prednisone.    The following portions of the patient's history were reviewed and updated as appropriate: allergies, current medications, past family history, past medical history, past social history, past surgical history and problem list.  Review of Systems Pertinent items are noted in HPI.   Objective:    BP 140/76  Pulse 88  Temp(Src) 98.7 F (37.1 C) (Oral)  SpO2 98% General appearance: alert, cooperative, appears stated age and no distress Ears: normal TM's and external ear canals both ears Nose: green discharge, moderate congestion, turbinates red, swollen, sinus tenderness bilateral Throat: lips, mucosa, and tongue normal; teeth and gums normal Neck: mild anterior cervical adenopathy, supple, symmetrical, trachea midline and thyroid not enlarged, symmetric, no tenderness/mass/nodules Lungs: clear to auscultation bilaterally Heart: S1, S2 normal Skin: petechiae - lower leg(s) bilateral    Assessment:    Acute bacterial sinusitis.    Plan:    Nasal steroids per medication orders. Antihistamines per medication orders. Zithromax per medication orders. f/u prn

## 2014-01-09 NOTE — Patient Instructions (Signed)

## 2014-01-09 NOTE — Assessment & Plan Note (Signed)
pred taper and depo medrol

## 2014-01-09 NOTE — Progress Notes (Signed)
Pre visit review using our clinic review tool, if applicable. No additional management support is needed unless otherwise documented below in the visit note. 

## 2014-01-10 ENCOUNTER — Encounter: Payer: Self-pay | Admitting: Family Medicine

## 2014-01-15 ENCOUNTER — Encounter: Payer: BC Managed Care – PPO | Admitting: Neurology

## 2014-01-16 ENCOUNTER — Telehealth: Payer: Self-pay | Admitting: Neurology

## 2014-01-16 NOTE — Telephone Encounter (Signed)
Pt no showed EMG w/ Dr. Posey Pronto 01/15/14. No show letter mailed to pt / Sherri S

## 2014-01-16 NOTE — Telephone Encounter (Signed)
Spoke w/ pt. She says she is in [place] d/t a family death. She is unsure when she will get back in the area. She has cancelled her follow up as well. She will call if she would like to r/s / Sherri S.

## 2014-01-16 NOTE — Telephone Encounter (Signed)
Noted.  Niemah Schwebke K. Posey Pronto, DO

## 2014-01-27 ENCOUNTER — Ambulatory Visit: Payer: BC Managed Care – PPO | Admitting: Neurology

## 2014-02-05 ENCOUNTER — Other Ambulatory Visit: Payer: Self-pay | Admitting: Family Medicine

## 2014-02-11 ENCOUNTER — Other Ambulatory Visit: Payer: Self-pay | Admitting: Family Medicine

## 2014-02-19 ENCOUNTER — Ambulatory Visit (INDEPENDENT_AMBULATORY_CARE_PROVIDER_SITE_OTHER): Payer: BC Managed Care – PPO | Admitting: Licensed Clinical Social Worker

## 2014-02-19 DIAGNOSIS — F4310 Post-traumatic stress disorder, unspecified: Secondary | ICD-10-CM

## 2014-02-24 ENCOUNTER — Ambulatory Visit: Payer: BC Managed Care – PPO | Admitting: Family Medicine

## 2014-02-26 ENCOUNTER — Ambulatory Visit (INDEPENDENT_AMBULATORY_CARE_PROVIDER_SITE_OTHER): Payer: BC Managed Care – PPO | Admitting: Licensed Clinical Social Worker

## 2014-02-26 DIAGNOSIS — F4310 Post-traumatic stress disorder, unspecified: Secondary | ICD-10-CM

## 2014-03-03 ENCOUNTER — Ambulatory Visit (INDEPENDENT_AMBULATORY_CARE_PROVIDER_SITE_OTHER): Payer: BC Managed Care – PPO | Admitting: Family Medicine

## 2014-03-03 ENCOUNTER — Encounter: Payer: Self-pay | Admitting: Family Medicine

## 2014-03-03 VITALS — BP 132/64 | HR 93 | Temp 98.5°F | Wt 185.0 lb

## 2014-03-03 DIAGNOSIS — R768 Other specified abnormal immunological findings in serum: Secondary | ICD-10-CM

## 2014-03-03 DIAGNOSIS — IMO0002 Reserved for concepts with insufficient information to code with codable children: Secondary | ICD-10-CM

## 2014-03-03 DIAGNOSIS — E1159 Type 2 diabetes mellitus with other circulatory complications: Secondary | ICD-10-CM

## 2014-03-03 DIAGNOSIS — F411 Generalized anxiety disorder: Secondary | ICD-10-CM

## 2014-03-03 DIAGNOSIS — R894 Abnormal immunological findings in specimens from other organs, systems and tissues: Secondary | ICD-10-CM

## 2014-03-03 DIAGNOSIS — E1151 Type 2 diabetes mellitus with diabetic peripheral angiopathy without gangrene: Secondary | ICD-10-CM

## 2014-03-03 DIAGNOSIS — E1165 Type 2 diabetes mellitus with hyperglycemia: Secondary | ICD-10-CM

## 2014-03-03 DIAGNOSIS — E785 Hyperlipidemia, unspecified: Secondary | ICD-10-CM

## 2014-03-03 DIAGNOSIS — I10 Essential (primary) hypertension: Secondary | ICD-10-CM

## 2014-03-03 LAB — LIPID PANEL
Cholesterol: 118 mg/dL (ref 0–200)
HDL: 37.2 mg/dL — AB (ref >39.00)
LDL CALC: 60 mg/dL (ref 0–99)
TRIGLYCERIDES: 106 mg/dL (ref 0.0–149.0)
Total CHOL/HDL Ratio: 3
VLDL: 21.2 mg/dL (ref 0.0–40.0)

## 2014-03-03 LAB — CBC WITH DIFFERENTIAL/PLATELET
BASOS ABS: 0 10*3/uL (ref 0.0–0.1)
Basophils Relative: 0.4 % (ref 0.0–3.0)
EOS ABS: 0.1 10*3/uL (ref 0.0–0.7)
Eosinophils Relative: 1.2 % (ref 0.0–5.0)
HCT: 35.7 % — ABNORMAL LOW (ref 36.0–46.0)
Hemoglobin: 11.8 g/dL — ABNORMAL LOW (ref 12.0–15.0)
LYMPHS PCT: 27.5 % (ref 12.0–46.0)
Lymphs Abs: 1.4 10*3/uL (ref 0.7–4.0)
MCHC: 33.1 g/dL (ref 30.0–36.0)
MCV: 85.3 fl (ref 78.0–100.0)
MONO ABS: 0.3 10*3/uL (ref 0.1–1.0)
Monocytes Relative: 7 % (ref 3.0–12.0)
NEUTROS PCT: 63.9 % (ref 43.0–77.0)
Neutro Abs: 3.2 10*3/uL (ref 1.4–7.7)
Platelets: 162 10*3/uL (ref 150.0–400.0)
RBC: 4.19 Mil/uL (ref 3.87–5.11)
RDW: 15.2 % (ref 11.5–15.5)
WBC: 4.9 10*3/uL (ref 4.0–10.5)

## 2014-03-03 LAB — BASIC METABOLIC PANEL
BUN: 20 mg/dL (ref 6–23)
CALCIUM: 9.3 mg/dL (ref 8.4–10.5)
CHLORIDE: 98 meq/L (ref 96–112)
CO2: 28 mEq/L (ref 19–32)
CREATININE: 1.4 mg/dL — AB (ref 0.4–1.2)
GFR: 41.03 mL/min — ABNORMAL LOW (ref >60.00)
GLUCOSE: 468 mg/dL — AB (ref 70–99)
Potassium: 4.3 mEq/L (ref 3.5–5.1)
Sodium: 134 mEq/L — ABNORMAL LOW (ref 135–145)

## 2014-03-03 LAB — HEPATIC FUNCTION PANEL
ALT: 19 U/L (ref 0–35)
AST: 19 U/L (ref 0–37)
Albumin: 3.5 g/dL (ref 3.5–5.2)
Alkaline Phosphatase: 76 U/L (ref 39–117)
BILIRUBIN DIRECT: 0.1 mg/dL (ref 0.0–0.3)
BILIRUBIN TOTAL: 0.6 mg/dL (ref 0.2–1.2)
TOTAL PROTEIN: 6.8 g/dL (ref 6.0–8.3)

## 2014-03-03 LAB — HEMOGLOBIN A1C: Hgb A1c MFr Bld: 14.4 % — ABNORMAL HIGH (ref 4.6–6.5)

## 2014-03-03 MED ORDER — ESCITALOPRAM OXALATE 20 MG PO TABS: 20.0000 mg | ORAL_TABLET | Freq: Every day | ORAL | Status: DC

## 2014-03-03 NOTE — Patient Instructions (Signed)
Generalized Anxiety Disorder Generalized anxiety disorder (GAD) is a mental disorder. It interferes with life functions, including relationships, work, and school. GAD is different from normal anxiety, which everyone experiences at some point in their lives in response to specific life events and activities. Normal anxiety actually helps us prepare for and get through these life events and activities. Normal anxiety goes away after the event or activity is over.  GAD causes anxiety that is not necessarily related to specific events or activities. It also causes excess anxiety in proportion to specific events or activities. The anxiety associated with GAD is also difficult to control. GAD can vary from mild to severe. People with severe GAD can have intense waves of anxiety with physical symptoms (panic attacks).  SYMPTOMS The anxiety and worry associated with GAD are difficult to control. This anxiety and worry are related to many life events and activities and also occur more days than not for 6 months or longer. People with GAD also have three or more of the following symptoms (one or more in children):  Restlessness.   Fatigue.  Difficulty concentrating.   Irritability.  Muscle tension.  Difficulty sleeping or unsatisfying sleep. DIAGNOSIS GAD is diagnosed through an assessment by your caregiver. Your caregiver will ask you questions aboutyour mood,physical symptoms, and events in your life. Your caregiver may ask you about your medical history and use of alcohol or drugs, including prescription medications. Your caregiver may also do a physical exam and blood tests. Certain medical conditions and the use of certain substances can cause symptoms similar to those associated with GAD. Your caregiver may refer you to a mental health specialist for further evaluation. TREATMENT The following therapies are usually used to treat GAD:   Medication Antidepressant medication usually is  prescribed for long-term daily control. Antianxiety medications may be added in severe cases, especially when panic attacks occur.   Talk therapy (psychotherapy) Certain types of talk therapy can be helpful in treating GAD by providing support, education, and guidance. A form of talk therapy called cognitive behavioral therapy can teach you healthy ways to think about and react to daily life events and activities.  Stress managementtechniques These include yoga, meditation, and exercise and can be very helpful when they are practiced regularly. A mental health specialist can help determine which treatment is best for you. Some people see improvement with one therapy. However, other people require a combination of therapies. Document Released: 02/10/2013 Document Reviewed: 02/10/2013 [employer] Patient Information 2014 [employer].  

## 2014-03-03 NOTE — Progress Notes (Signed)
Pre visit review using our clinic review tool, if applicable. No additional management support is needed unless otherwise documented below in the visit note. 

## 2014-03-04 ENCOUNTER — Telehealth: Payer: Self-pay | Admitting: Family Medicine

## 2014-03-04 NOTE — Telephone Encounter (Signed)
Relevant patient education assigned to patient using Emmi. ° °

## 2014-03-04 NOTE — Progress Notes (Signed)
   Subjective:    [name], female    DOB: 1956/02/15, 58 y.o.   MRN: [id number]  HPI  HPI HYPERTENSION  Blood pressure range-good per pt  Chest pain- no      Dyspnea- no Lightheadedness- no   Edema- no Other side effects - no   Medication compliance: no Low salt diet- no  DIABETES  Blood Sugar ranges-good per pt  Polyuria- no New Visual problems- no Hypoglycemic symptoms- no Other side effects-no Medication compliance - no Last eye exam- due Foot exam- today  HYPERLIPIDEMIA  Medication compliance- good RUQ pain- no  Muscle aches- no Other side effects-no        Review of Systems As above    Objective:   Physical Exam  BP 132/64  Pulse 93  Temp(Src) 98.5 F (36.9 C) (Oral)  Wt 185 lb (83.915 kg)  SpO2 97% General appearance: alert, cooperative, appears stated age and no distress Ears: normal TM's and external ear canals both ears Nose: Nares normal. Septum midline. Mucosa normal. No drainage or sinus tenderness. Throat: lips, mucosa, and tongue normal; teeth and gums normal Neck: no adenopathy, no carotid bruit, no JVD, supple, symmetrical, trachea midline and thyroid not enlarged, symmetric, no tenderness/mass/nodules Lungs: clear to auscultation bilaterally Heart: regular rate and rhythm, S1, S2 normal, no murmur, click, rub or gallop Extremities: extremities normal, atraumatic, no cyanosis or edema  Sensory exam of the foot is normal, tested with the monofilament. Good pulses, no lesions or ulcers, good peripheral pulses. 2    Assessment & Plan:  1. Generalized anxiety disorder  - escitalopram (LEXAPRO) 20 MG tablet; Take 1 tablet (20 mg total) by mouth daily.  Dispense: 30 tablet; Refill: 5  2. Elevated antinuclear antibody (ANA) level  - Ambulatory referral to Rheumatology  3. Other and unspecified hyperlipidemia  - Hemoglobin A1c - Hepatic function panel - Lipid panel  4. HTN (hypertension)  - Basic metabolic panel -  CBC with Differential  5. DM (diabetes mellitus) type II uncontrolled, periph vascular disorder \ - Hepatic function panel - Lipid panel

## 2014-03-10 ENCOUNTER — Ambulatory Visit: Payer: BC Managed Care – PPO | Admitting: Licensed Clinical Social Worker

## 2014-03-11 ENCOUNTER — Telehealth: Payer: Self-pay

## 2014-03-11 NOTE — Telephone Encounter (Signed)
Relevant patient education assigned to patient using Emmi. ° °

## 2014-03-16 ENCOUNTER — Other Ambulatory Visit: Payer: Self-pay | Admitting: Family Medicine

## 2014-03-24 ENCOUNTER — Ambulatory Visit: Payer: BC Managed Care – PPO | Admitting: Licensed Clinical Social Worker

## 2014-03-26 ENCOUNTER — Encounter: Payer: Self-pay | Admitting: Family Medicine

## 2014-03-31 ENCOUNTER — Ambulatory Visit (INDEPENDENT_AMBULATORY_CARE_PROVIDER_SITE_OTHER): Payer: BC Managed Care – PPO | Admitting: Licensed Clinical Social Worker

## 2014-03-31 DIAGNOSIS — F4310 Post-traumatic stress disorder, unspecified: Secondary | ICD-10-CM

## 2014-04-09 ENCOUNTER — Ambulatory Visit: Payer: BC Managed Care – PPO | Admitting: Family Medicine

## 2014-04-09 DIAGNOSIS — Z0289 Encounter for other administrative examinations: Secondary | ICD-10-CM

## 2014-04-13 ENCOUNTER — Other Ambulatory Visit (HOSPITAL_COMMUNITY)
Admission: RE | Admit: 2014-04-13 | Discharge: 2014-04-13 | Disposition: A | Discharge status: Home / Self Care | Payer: BC Managed Care – PPO | Source: Ambulatory Visit | Attending: Family Medicine | Admitting: Family Medicine

## 2014-04-13 ENCOUNTER — Encounter: Payer: Self-pay | Admitting: Family Medicine

## 2014-04-13 ENCOUNTER — Ambulatory Visit (INDEPENDENT_AMBULATORY_CARE_PROVIDER_SITE_OTHER): Payer: BC Managed Care – PPO | Admitting: Family Medicine

## 2014-04-13 VITALS — BP 116/72 | HR 84 | Temp 98.8°F | Ht 61.5 in | Wt 181.8 lb

## 2014-04-13 DIAGNOSIS — E785 Hyperlipidemia, unspecified: Secondary | ICD-10-CM

## 2014-04-13 DIAGNOSIS — R002 Palpitations: Secondary | ICD-10-CM

## 2014-04-13 DIAGNOSIS — R195 Other fecal abnormalities: Secondary | ICD-10-CM

## 2014-04-13 DIAGNOSIS — I252 Old myocardial infarction: Secondary | ICD-10-CM

## 2014-04-13 DIAGNOSIS — R059 Cough, unspecified: Secondary | ICD-10-CM

## 2014-04-13 DIAGNOSIS — R05 Cough: Secondary | ICD-10-CM

## 2014-04-13 DIAGNOSIS — E2839 Other primary ovarian failure: Secondary | ICD-10-CM

## 2014-04-13 DIAGNOSIS — I10 Essential (primary) hypertension: Secondary | ICD-10-CM

## 2014-04-13 DIAGNOSIS — Z01419 Encounter for gynecological examination (general) (routine) without abnormal findings: Secondary | ICD-10-CM | POA: Insufficient documentation

## 2014-04-13 DIAGNOSIS — N760 Acute vaginitis: Secondary | ICD-10-CM | POA: Insufficient documentation

## 2014-04-13 DIAGNOSIS — Z1151 Encounter for screening for human papillomavirus (HPV): Secondary | ICD-10-CM | POA: Insufficient documentation

## 2014-04-13 DIAGNOSIS — Z124 Encounter for screening for malignant neoplasm of cervix: Secondary | ICD-10-CM

## 2014-04-13 DIAGNOSIS — Z0000 Encounter for general adult medical examination without abnormal findings: Secondary | ICD-10-CM

## 2014-04-13 DIAGNOSIS — E1149 Type 2 diabetes mellitus with other diabetic neurological complication: Secondary | ICD-10-CM

## 2014-04-13 DIAGNOSIS — Z1231 Encounter for screening mammogram for malignant neoplasm of breast: Secondary | ICD-10-CM

## 2014-04-13 MED ORDER — BENZONATATE 200 MG PO CAPS: 200.0000 mg | ORAL_CAPSULE | Freq: Two times a day (BID) | ORAL | Status: DC | PRN

## 2014-04-13 NOTE — Progress Notes (Signed)
Subjective:     [name] [name] is a 58 y.o. female and is here for a comprehensive physical exam. The patient reports no new problems.  History   Social History  . Marital Status: Married    Spouse Name: N/A    Number of Children: 1  . Years of Education: N/A   Occupational History  . transports organs for transplantation    Social History Main Topics  . Smoking status: Never Smoker   . Smokeless tobacco: Never Used  . Alcohol Use: No  . Drug Use: No  . Sexual Activity: No   Other Topics Concern  . Not on file   Social History Narrative   Widowed once,and divorced once   Lives with husband.  She had one living child and three who had deceased (one killed by drunk driver, one died at age of 46-days due to heart problems, and other at the age of 5 due to heart problems)   She is a homemaker currently.  She was previously working as a Armed forces training and education officer.   She lost one child in the 90's   Daily caffeine   Health Maintenance  Topic Date Due  . Ophthalmology Exam  01/13/1966  . Mammogram  01/13/2006  . Colonoscopy  01/13/2006  . Pap Smear  11/30/2009  . Urine Microalbumin  04/04/2014  . Influenza Vaccine  05/30/2014  . Hemoglobin A1c  09/03/2014  . Tetanus/tdap  09/29/2014  . Foot Exam  11/19/2014  . Pneumococcal Polysaccharide Vaccine (##2) 02/05/2018    The following portions of the patient's history were reviewed and updated as appropriate:  She  has a past medical history of Hypertension; Fibromyalgia; High cholesterol; PTSD (post-traumatic stress disorder); Anginal pain; Proximal humerus fracture (10/15/12); Diabetes mellitus; Anemia; Fatty liver; Anxiety; Acute MI (2007); Exertional shortness of breath; Osteoarthritis; and Chronic kidney disease. She  does not have any pertinent problems on file. 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 Her family history includes Cancer in her father; Colitis in her father; Crohn's disease in her father; Diabetes in her brother, father, and mother; Heart disease in her father; Heart disease (age of onset: 51) in her brother; Hypertension in her mother; Irritable bowel syndrome in her daughter; Kidney disease in her brother; Kidney failure in her brother; Leukemia in her father; Mental illness in her mother. She  reports that she has never smoked. She has never used smokeless tobacco. She reports that she does not drink alcohol or use illicit drugs. She has a current medication list which includes the following prescription(s): atorvastatin, calcium carbonate-vitamin d, vitamin d3, cinnamon, duloxetine, escitalopram, ferrous sulfate, flax seed oil, fluticasone, gabapentin, hydrochlorothiazide, hydrocodone-ibuprofen, insulin aspart, insulin glargine, lisinopril, metaxalone, multivitamin, ondansetron, polyethylene glycol, and vitamin c. Current Outpatient Prescriptions on File Prior to Visit  Medication Sig Dispense Refill  . atorvastatin (LIPITOR) 10 MG tablet Take 1 tablet (10 mg total) by mouth daily.  30 tablet  5  . Calcium Carbonate-Vitamin D (CALTRATE 600+D) 600-400 MG-UNIT per tablet Take 1 tablet by mouth daily.       . Cholecalciferol (VITAMIN D3) 5000 UNITS CAPS Take 1 capsule by mouth 2 (two) times daily.      [name] Kitchen  CINNAMON PO Take 1,000 mg by mouth daily.      . DULoxetine (CYMBALTA) 60 MG capsule Take 60 mg by mouth daily.      [name] Kitchen escitalopram (LEXAPRO) 20 MG tablet Take 1 tablet (20 mg total) by mouth daily.  30 tablet  5  . ferrous sulfate 325 (65 FE) MG tablet Take 325 mg by mouth 2 (two)  times daily.       . Flaxseed, Linseed, (FLAX SEED OIL) 1000 MG CAPS Take 1 capsule by mouth at bedtime.      . fluticasone (FLONASE) 50 MCG/ACT nasal spray Place 2 sprays into both nostrils daily.  16 g  6  . gabapentin (NEURONTIN) 300 MG capsule TAKE TWO CAPSULES BY MOUTH THREE TIMES DAILY  180 capsule  5  . hydrochlorothiazide (HYDRODIURIL) 25 MG tablet Take 1 tablet (25 mg total) by mouth daily.  30 tablet  11  . HYDROcodone-ibuprofen (VICOPROFEN) 7.5-200 MG per tablet TAKE ONE TABLET BY MOUTH EVERY 6 HOURS AS NEEDED  50 tablet  0  . insulin aspart (NOVOLOG FLEXPEN) 100 UNIT/ML FlexPen 15 units with breakfast and 16 units with the evening meal, and pen needles 3/day  15 mL  11  . insulin glargine (LANTUS) 100 units/mL SOLN Inject 20 Units into the skin at bedtime.       [name] Kitchen lisinopril (PRINIVIL,ZESTRIL) 5 MG tablet Take 1 tablet by mouth daily.      . metaxalone (SKELAXIN) 800 MG tablet Take 1 tablet (800 mg total) by mouth 3 (three) times daily.  30 tablet  1  . Multiple Vitamin (MULTIVITAMIN) tablet Take 1 tablet by mouth daily.       . ondansetron (ZOFRAN ODT) 4 MG disintegrating tablet Take 1 tablet (4 mg total) by mouth every 8 (eight) hours as needed for nausea or vomiting.  20 tablet  0  . polyethylene glycol (MIRALAX / GLYCOLAX) packet Take 17 g by mouth daily.        . vitamin C (ASCORBIC ACID) 500 MG tablet Take 500 mg by mouth daily.      . [DISCONTINUED] metFORMIN (GLUCOPHAGE) 1000 MG tablet Take 1,000 mg by mouth 2 (two) times daily with a meal.        . [DISCONTINUED] omeprazole (PRILOSEC) 20 MG capsule Take 20 mg by mouth daily.         No current facility-administered medications on file prior to visit.   She is allergic to fish-derived products; mushroom extract complex; penicillins; shellfish allergy; tomato; aloe vera; and rosemary oil..  Review of Systems Review of Systems  Constitutional: Negative for activity change, appetite change and fatigue.  HENT: Negative for  hearing loss, congestion, tinnitus and ear discharge.  dentist [id number]: Negative for visual disturbance (see optho q1y -- vision corrected to 20/20 with glasses).  Respiratory: Negative for cough, chest tightness and shortness of breath.   Cardiovascular: Negative for chest pain, palpitations and leg swelling.  Gastrointestinal: Negative for abdominal pain, diarrhea, constipation and abdominal distention.  Genitourinary: Negative for urgency, frequency, decreased urine volume and difficulty urinating.  Musculoskeletal: Negative for back pain, arthralgias and gait problem.  Skin: Negative for color change, pallor and rash.  Neurological: Negative for dizziness, light-headedness, numbness and headaches.  Hematological: Negative for adenopathy. Does not bruise/bleed easily.  Psychiatric/Behavioral: Negative for suicidal ideas, confusion, sleep disturbance, self-injury, dysphoric mood, decreased concentration and agitation.       Objective:    BP 116/72  Pulse 84  Temp(Src) 98.8 F (37.1  C) (Oral)  Ht 5' 1.5" (1.562 m)  Wt 181 lb 12.8 oz (82.464 kg)  BMI 33.80 kg/m2  SpO2 97% General appearance: alert, cooperative, appears stated age and no distress Head: Normocephalic, without obvious abnormality, atraumatic Eyes: conjunctivae/corneas clear. PERRL, EOM's intact. Fundi benign. Ears: normal TM's and external ear canals both ears Nose: Nares normal. Septum midline. Mucosa normal. No drainage or sinus tenderness. Throat: lips, mucosa, and tongue normal; teeth and gums normal Neck: no adenopathy, no carotid bruit, no JVD, supple, symmetrical, trachea midline and thyroid not enlarged, symmetric, no tenderness/mass/nodules Back: symmetric, no curvature. ROM normal. No CVA tenderness. Lungs: clear to auscultation bilaterally Breasts: normal appearance, no masses or tenderness Heart: regular rate and rhythm, S1, S2 normal, no murmur, click, rub or gallop Abdomen: soft, non-tender; bowel  sounds normal; no masses,  no organomegaly Pelvic: cervix normal in appearance, external genitalia normal, no adnexal masses or tenderness, no cervical motion tenderness, rectovaginal septum normal, uterus normal size, shape, and consistency, vagina normal without discharge and pap done heme + brown stool Extremities: extremities normal, atraumatic, no cyanosis or edema Pulses: 2+ and symmetric Skin: Skin color, texture, turgor normal. No rashes or lesions Lymph nodes: Cervical, supraclavicular, and axillary nodes normal. Neurologic: Alert and oriented X 3, normal strength and tone. Normal symmetric reflexes. Normal coordination and gait Psych--no depression, no anxiety      Assessment:    Healthy female exam.       Plan:  ghm utd  Check labs   See After Visit Summary for Counseling Recommendations   1. Preventative health care  - Cytology - PAP  2. DM (diabetes mellitus) type II controlled, neurological manifestation Per endo - Ambulatory referral to Ophthalmology  3. HTN (hypertension) stable 4. Other and unspecified hyperlipidemia Stable  5. Other screening mammogram   - MM DIGITAL SCREENING BILATERAL; Future  6. Estrogen deficiency   - DG Bone Density; Future  7. Heme positive stool   - Ambulatory referral to Gastroenterology - CBC with Differential  8. Palpitations   - EKG 12-Lead  9. History of MI (myocardial infarction) Check labs No cp  - EKG 12-Lead  10. Screening for malignant neoplasm of the cervix   - Cytology - PAP  11. Cough  - benzonatate (TESSALON) 200 MG capsule; Take 1 capsule (200 mg total) by mouth 2 (two) times daily as needed for cough.  Dispense: 20 capsule; Refill: 0

## 2014-04-13 NOTE — Patient Instructions (Signed)
Preventive Care for Adults, Female A healthy lifestyle and preventive care can promote health and wellness. Preventive health guidelines for women include the following key practices.  A routine yearly physical is a good way to check with your health care provider about your health and preventive screening. It is a chance to share any concerns and updates on your health and to receive a thorough exam.  Visit your dentist for a routine exam and preventive care every 6 months. Brush your teeth twice a day and floss once a day. Good oral hygiene prevents tooth decay and gum disease.  The frequency of eye exams is based on your age, health, family medical history, use of contact lenses, and other factors. Follow your health care provider's recommendations for frequency of eye exams.  Eat a healthy diet. Foods like vegetables, fruits, whole grains, low-fat dairy products, and lean protein foods contain the nutrients you need without too many calories. Decrease your intake of foods high in solid fats, added sugars, and salt. Eat the right amount of calories for you.Get information about a proper diet from your health care provider, if necessary.  Regular physical exercise is one of the most important things you can do for your health. Most adults should get at least 150 minutes of moderate-intensity exercise (any activity that increases your heart rate and causes you to sweat) each week. In addition, most adults need muscle-strengthening exercises on 2 or more days a week.  Maintain a healthy weight. The body mass index (BMI) is a screening tool to identify possible weight problems. It provides an estimate of body fat based on height and weight. Your health care provider can find your BMI, and can help you achieve or maintain a healthy weight.For adults 20 years and older:  A BMI below 18.5 is considered underweight.  A BMI of 18.5 to 24.9 is normal.  A BMI of 25 to 29.9 is considered overweight.  A  BMI of 30 and above is considered obese.  Maintain normal blood lipids and cholesterol levels by exercising and minimizing your intake of saturated fat. Eat a balanced diet with plenty of fruit and vegetables. Blood tests for lipids and cholesterol should begin at age 62 and be repeated every 5 years. If your lipid or cholesterol levels are high, you are over 50, or you are at high risk for heart disease, you may need your cholesterol levels checked more frequently.Ongoing high lipid and cholesterol levels should be treated with medicines if diet and exercise are not working.  If you smoke, find out from your health care provider how to quit. If you do not use tobacco, do not start.  Lung cancer screening is recommended for adults aged 36 80 years who are at high risk for developing lung cancer because of a history of smoking. A yearly low-dose CT scan of the lungs is recommended for people who have at least a 30-pack-year history of smoking and are a current smoker or have quit within the past 15 years. A pack year of smoking is smoking an average of 1 pack of cigarettes a day for 1 year (for example: 1 pack a day for 30 years or 2 packs a day for 15 years). Yearly screening should continue until the smoker has stopped smoking for at least 15 years. Yearly screening should be stopped for people who develop a health problem that would prevent them from having lung cancer treatment.  If you are pregnant, do not drink alcohol. If you  are breastfeeding, be very cautious about drinking alcohol. If you are not pregnant and choose to drink alcohol, do not have more than 1 drink per day. One drink is considered to be 12 ounces (355 mL) of beer, 5 ounces (148 mL) of wine, or 1.5 ounces (44 mL) of liquor.  Avoid use of street drugs. Do not share needles with anyone. Ask for help if you need support or instructions about stopping the use of drugs.  High blood pressure causes heart disease and increases the risk  of stroke. Your blood pressure should be checked at least every 1 to 2 years. Ongoing high blood pressure should be treated with medicines if weight loss and exercise do not work.  If you are 39 58 years old, ask your health care provider if you should take aspirin to prevent strokes.  Diabetes screening involves taking a blood sample to check your fasting blood sugar level. This should be done once every 3 years, after age 56, if you are within normal weight and without risk factors for diabetes. Testing should be considered at a younger age or be carried out more frequently if you are overweight and have at least 1 risk factor for diabetes.  Breast cancer screening is essential preventive care for women. You should practice "breast self-awareness." This means understanding the normal appearance and feel of your breasts and may include breast self-examination. Any changes detected, no matter how small, should be reported to a health care provider. Women in their 40s and 30s should have a clinical breast exam (CBE) by a health care provider as part of a regular health exam every 1 to 3 years. After age 28, women should have a CBE every year. Starting at age 72, women should consider having a mammogram (breast X-ray test) every year. Women who have a family history of breast cancer should talk to their health care provider about genetic screening. Women at a high risk of breast cancer should talk to their health care providers about having an MRI and a mammogram every year.  Breast cancer gene (BRCA)-related cancer risk assessment is recommended for women who have family members with BRCA-related cancers. BRCA-related cancers include breast, ovarian, tubal, and peritoneal cancers. Having family members with these cancers may be associated with an increased risk for harmful changes (mutations) in the breast cancer genes BRCA1 and BRCA2. Results of the assessment will determine the need for genetic counseling  and BRCA1 and BRCA2 testing.  The Pap test is a screening test for cervical cancer. A Pap test can show cell changes on the cervix that might become cervical cancer if left untreated. A Pap test is a procedure in which cells are obtained and examined from the lower end of the uterus (cervix).  Women should have a Pap test starting at age 59.  Between ages 42 and 13, Pap tests should be repeated every 2 years.  Beginning at age 53, you should have a Pap test every 3 years as long as the past 3 Pap tests have been normal.  Some women have medical problems that increase the chance of getting cervical cancer. Talk to your health care provider about these problems. It is especially important to talk to your health care provider if a new problem develops soon after your last Pap test. In these cases, your health care provider may recommend more frequent screening and Pap tests.  The above recommendations are the same for women who have or have not gotten the vaccine  for human papillomavirus (HPV).  If you had a hysterectomy for a problem that was not cancer or a condition that could lead to cancer, then you no longer need Pap tests. Even if you no longer need a Pap test, a regular exam is a good idea to make sure no other problems are starting.  If you are between ages 31 and 51 years, and you have had normal Pap tests going back 10 years, you no longer need Pap tests. Even if you no longer need a Pap test, a regular exam is a good idea to make sure no other problems are starting.  If you have had past treatment for cervical cancer or a condition that could lead to cancer, you need Pap tests and screening for cancer for at least 20 years after your treatment.  If Pap tests have been discontinued, risk factors (such as a new sexual partner) need to be reassessed to determine if screening should be resumed.  The HPV test is an additional test that may be used for cervical cancer screening. The HPV test  looks for the virus that can cause the cell changes on the cervix. The cells collected during the Pap test can be tested for HPV. The HPV test could be used to screen women aged 74 years and older, and should be used in women of any age who have unclear Pap test results. After the age of 65, women should have HPV testing at the same frequency as a Pap test.  Colorectal cancer can be detected and often prevented. Most routine colorectal cancer screening begins at the age of 38 years and continues through age 74 years. However, your health care provider may recommend screening at an earlier age if you have risk factors for colon cancer. On a yearly basis, your health care provider may provide home test kits to check for hidden blood in the stool. Use of a small camera at the end of a tube, to directly examine the colon (sigmoidoscopy or colonoscopy), can detect the earliest forms of colorectal cancer. Talk to your health care provider about this at age 98, when routine screening begins. Direct exam of the colon should be repeated every 5 10 years through age 2 years, unless early forms of pre-cancerous polyps or small growths are found.  People who are at an increased risk for hepatitis B should be screened for this virus. You are considered at high risk for hepatitis B if:  You were born in a country where hepatitis B occurs often. Talk with your health care provider about which countries are considered high risk.  Your parents were born in a high-risk country and you have not received a shot to protect against hepatitis B (hepatitis B vaccine).  You have HIV or AIDS.  You use needles to inject street drugs.  You live with, or have sex with, someone who has Hepatitis B.  You get hemodialysis treatment.  You take certain medicines for conditions like cancer, organ transplantation, and autoimmune conditions.  Hepatitis C blood testing is recommended for all people born from [birth year] through 1965 and  any individual with known risks for hepatitis C.  Practice safe sex. Use condoms and avoid high-risk sexual practices to reduce the spread of sexually transmitted infections (STIs). STIs include gonorrhea, chlamydia, syphilis, trichomonas, herpes, HPV, and human immunodeficiency virus (HIV). Herpes, HIV, and HPV are viral illnesses that have no cure. They can result in disability, cancer, and death. Sexually active women aged 84  years and younger should be checked for chlamydia. Older women with new or multiple partners should also be tested for chlamydia. Testing for other STIs is recommended if you are sexually active and at increased risk.  Osteoporosis is a disease in which the bones lose minerals and strength with aging. This can result in serious bone fractures or breaks. The risk of osteoporosis can be identified using a bone density scan. Women ages 18 years and over and women at risk for fractures or osteoporosis should discuss screening with their health care providers. Ask your health care provider whether you should take a calcium supplement or vitamin D to reduce the rate of osteoporosis.  Menopause can be associated with physical symptoms and risks. Hormone replacement therapy is available to decrease symptoms and risks. You should talk to your health care provider about whether hormone replacement therapy is right for you.  Use sunscreen. Apply sunscreen liberally and repeatedly throughout the day. You should seek shade when your shadow is shorter than you. Protect yourself by wearing long sleeves, pants, a wide-brimmed hat, and sunglasses year round, whenever you are outdoors.  Once a month, do a whole body skin exam, using a mirror to look at the skin on your back. Tell your health care provider of new moles, moles that have irregular borders, moles that are larger than a pencil eraser, or moles that have changed in shape or color.  Stay current with required vaccines  (immunizations).  Influenza vaccine. All adults should be immunized every year.  Tetanus, diphtheria, and acellular pertussis (Td, Tdap) vaccine. Pregnant women should receive 1 dose of Tdap vaccine during each pregnancy. The dose should be obtained regardless of the length of time since the last dose. Immunization is preferred during the 27th 36th week of gestation. An adult who has not previously received Tdap or who does not know her vaccine status should receive 1 dose of Tdap. This initial dose should be followed by tetanus and diphtheria toxoids (Td) booster doses every 10 years. Adults with an unknown or incomplete history of completing a 3-dose immunization series with Td-containing vaccines should begin or complete a primary immunization series including a Tdap dose. Adults should receive a Td booster every 10 years.  Varicella vaccine. An adult without evidence of immunity to varicella should receive 2 doses or a second dose if she has previously received 1 dose. Pregnant females who do not have evidence of immunity should receive the first dose after pregnancy. This first dose should be obtained before leaving the health care facility. The second dose should be obtained 4 8 weeks after the first dose.  Human papillomavirus (HPV) vaccine. Females aged 9 26 years who have not received the vaccine previously should obtain the 3-dose series. The vaccine is not recommended for use in pregnant females. However, pregnancy testing is not needed before receiving a dose. If a female is found to be pregnant after receiving a dose, no treatment is needed. In that case, the remaining doses should be delayed until after the pregnancy. Immunization is recommended for any person with an immunocompromised condition through the age of 51 years if she did not get any or all doses earlier. During the 3-dose series, the second dose should be obtained 4 8 weeks after the first dose. The third dose should be obtained  24 weeks after the first dose and 16 weeks after the second dose.  Zoster vaccine. One dose is recommended for adults aged 57 years or older unless certain  conditions are present.  Measles, mumps, and rubella (MMR) vaccine. Adults born before [birth year] generally are considered immune to measles and mumps. Adults born in [birth year] or later should have 1 or more doses of MMR vaccine unless there is a contraindication to the vaccine or there is laboratory evidence of immunity to each of the three diseases. A routine second dose of MMR vaccine should be obtained at least 28 days after the first dose for students attending postsecondary schools, health care workers, or international travelers. People who received inactivated measles vaccine or an unknown type of measles vaccine during 1963 1967 should receive 2 doses of MMR vaccine. People who received inactivated mumps vaccine or an unknown type of mumps vaccine before 1979 and are at high risk for mumps infection should consider immunization with 2 doses of MMR vaccine. For females of childbearing age, rubella immunity should be determined. If there is no evidence of immunity, females who are not pregnant should be vaccinated. If there is no evidence of immunity, females who are pregnant should delay immunization until after pregnancy. Unvaccinated health care workers born before [birth year] who lack laboratory evidence of measles, mumps, or rubella immunity or laboratory confirmation of disease should consider measles and mumps immunization with 2 doses of MMR vaccine or rubella immunization with 1 dose of MMR vaccine.  Pneumococcal 13-valent conjugate (PCV13) vaccine. When indicated, a person who is uncertain of her immunization history and has no record of immunization should receive the PCV13 vaccine. An adult aged 57 years or older who has certain medical conditions and has not been previously immunized should receive 1 dose of PCV13 vaccine. This PCV13 should be followed  with a dose of pneumococcal polysaccharide (PPSV23) vaccine. The PPSV23 vaccine dose should be obtained at least 8 weeks after the dose of PCV13 vaccine. An adult aged 5 years or older who has certain medical conditions and previously received 1 or more doses of PPSV23 vaccine should receive 1 dose of PCV13. The PCV13 vaccine dose should be obtained 1 or more years after the last PPSV23 vaccine dose.  Pneumococcal polysaccharide (PPSV23) vaccine. When PCV13 is also indicated, PCV13 should be obtained first. All adults aged 52 years and older should be immunized. An adult younger than age 80 years who has certain medical conditions should be immunized. Any person who resides in a nursing home or long-term care facility should be immunized. An adult smoker should be immunized. People with an immunocompromised condition and certain other conditions should receive both PCV13 and PPSV23 vaccines. People with human immunodeficiency virus (HIV) infection should be immunized as soon as possible after diagnosis. Immunization during chemotherapy or radiation therapy should be avoided. Routine use of PPSV23 vaccine is not recommended for American Indians, [place] Natives, or people younger than 65 years unless there are medical conditions that require PPSV23 vaccine. When indicated, people who have unknown immunization and have no record of immunization should receive PPSV23 vaccine. One-time revaccination 5 years after the first dose of PPSV23 is recommended for people aged 53 64 years who have chronic kidney failure, nephrotic syndrome, asplenia, or immunocompromised conditions. People who received 1 2 doses of PPSV23 before age 52 years should receive another dose of PPSV23 vaccine at age 46 years or later if at least 5 years have passed since the previous dose. Doses of PPSV23 are not needed for people immunized with PPSV23 at or after age 19 years.  Meningococcal vaccine. Adults with asplenia or persistent complement  component deficiencies should receive 2  doses of quadrivalent meningococcal conjugate (MenACWY-D) vaccine. The doses should be obtained at least 2 months apart. Microbiologists working with certain meningococcal bacteria, [employer] recruits, people at risk during an outbreak, and people who travel to or live in countries with a high rate of meningitis should be immunized. A first-year college student up through age 33 years who is living in a residence hall should receive a dose if she did not receive a dose on or after her 16th birthday. Adults who have certain high-risk conditions should receive one or more doses of vaccine.  Hepatitis A vaccine. Adults who wish to be protected from this disease, have certain high-risk conditions, work with hepatitis A-infected animals, work in hepatitis A research labs, or travel to or work in countries with a high rate of hepatitis A should be immunized. Adults who were previously unvaccinated and who anticipate close contact with an international adoptee during the first 60 days after arrival in the [place] from a country with a high rate of hepatitis A should be immunized.  Hepatitis B vaccine. Adults who wish to be protected from this disease, have certain high-risk conditions, may be exposed to blood or other infectious body fluids, are household contacts or sex partners of hepatitis B positive people, are clients or workers in certain care facilities, or travel to or work in countries with a high rate of hepatitis B should be immunized.  Haemophilus influenzae type b (Hib) vaccine. A previously unvaccinated person with asplenia or sickle cell disease or having a scheduled splenectomy should receive 1 dose of Hib vaccine. Regardless of previous immunization, a recipient of a hematopoietic stem cell transplant should receive a 3-dose series 6 12 months after her successful transplant. Hib vaccine is not recommended for adults with HIV infection. Preventive  Services / Frequency Ages 55 to 39years  Blood pressure check.** / Every 1 to 2 years.  Lipid and cholesterol check.** / Every 5 years beginning at age 67.  Clinical breast exam.** / Every 3 years for women in their 45s and 62s.  BRCA-related cancer risk assessment.** / For women who have family members with a BRCA-related cancer (breast, ovarian, tubal, or peritoneal cancers).  Pap test.** / Every 2 years from ages 61 through 4. Every 3 years starting at age 61 through age 37 or 37 with a history of 3 consecutive normal Pap tests.  HPV screening.** / Every 3 years from ages 57 through ages 1 to 23 with a history of 3 consecutive normal Pap tests.  Hepatitis C blood test.** / For any individual with known risks for hepatitis C.  Skin self-exam. / Monthly.  Influenza vaccine. / Every year.  Tetanus, diphtheria, and acellular pertussis (Tdap, Td) vaccine.** / Consult your health care provider. Pregnant women should receive 1 dose of Tdap vaccine during each pregnancy. 1 dose of Td every 10 years.  Varicella vaccine.** / Consult your health care provider. Pregnant females who do not have evidence of immunity should receive the first dose after pregnancy.  HPV vaccine. / 3 doses over 6 months, if 21 and younger. The vaccine is not recommended for use in pregnant females. However, pregnancy testing is not needed before receiving a dose.  Measles, mumps, rubella (MMR) vaccine.** / You need at least 1 dose of MMR if you were born in 1957 or later. You may also need a 2nd dose. For females of childbearing age, rubella immunity should be determined. If there is no evidence of immunity, females who are not  pregnant should be vaccinated. If there is no evidence of immunity, females who are pregnant should delay immunization until after pregnancy.  Pneumococcal 13-valent conjugate (PCV13) vaccine.** / Consult your health care provider.  Pneumococcal polysaccharide (PPSV23) vaccine.** / 1 to 2  doses if you smoke cigarettes or if you have certain conditions.  Meningococcal vaccine.** / 1 dose if you are age 88 to 6 years and a [employer] researcher living in a residence hall, or have one of several medical conditions, you need to get vaccinated against meningococcal disease. You may also need additional booster doses.  Hepatitis A vaccine.** / Consult your health care provider.  Hepatitis B vaccine.** / Consult your health care provider.  Haemophilus influenzae type b (Hib) vaccine.** / Consult your health care provider. Ages 23 to 64years  Blood pressure check.** / Every 1 to 2 years.  Lipid and cholesterol check.** / Every 5 years beginning at age 20 years.  Lung cancer screening. / Every year if you are aged 51 80 years and have a 30-pack-year history of smoking and currently smoke or have quit within the past 15 years. Yearly screening is stopped once you have quit smoking for at least 15 years or develop a health problem that would prevent you from having lung cancer treatment.  Clinical breast exam.** / Every year after age 8 years.  BRCA-related cancer risk assessment.** / For women who have family members with a BRCA-related cancer (breast, ovarian, tubal, or peritoneal cancers).  Mammogram.** / Every year beginning at age 10 years and continuing for as long as you are in good health. Consult with your health care provider.  Pap test.** / Every 3 years starting at age 30 years through age 5 or 61 years with a history of 3 consecutive normal Pap tests.  HPV screening.** / Every 3 years from ages 39 years through ages 72 to 19 years with a history of 3 consecutive normal Pap tests.  Fecal occult blood test (FOBT) of stool. / Every year beginning at age 59 years and continuing until age 27 years. You may not need to do this test if you get a colonoscopy every 10 years.  Flexible sigmoidoscopy or colonoscopy.** / Every 5 years for a flexible sigmoidoscopy or every  10 years for a colonoscopy beginning at age 110 years and continuing until age 63 years.  Hepatitis C blood test.** / For all people born from [birth year] through 1965 and any individual with known risks for hepatitis C.  Skin self-exam. / Monthly.  Influenza vaccine. / Every year.  Tetanus, diphtheria, and acellular pertussis (Tdap/Td) vaccine.** / Consult your health care provider. Pregnant women should receive 1 dose of Tdap vaccine during each pregnancy. 1 dose of Td every 10 years.  Varicella vaccine.** / Consult your health care provider. Pregnant females who do not have evidence of immunity should receive the first dose after pregnancy.  Zoster vaccine.** / 1 dose for adults aged 46 years or older.  Measles, mumps, rubella (MMR) vaccine.** / You need at least 1 dose of MMR if you were born in 1957 or later. You may also need a 2nd dose. For females of childbearing age, rubella immunity should be determined. If there is no evidence of immunity, females who are not pregnant should be vaccinated. If there is no evidence of immunity, females who are pregnant should delay immunization until after pregnancy.  Pneumococcal 13-valent conjugate (PCV13) vaccine.** / Consult your health care provider.  Pneumococcal polysaccharide (PPSV23) vaccine.** / 1  to 2 doses if you smoke cigarettes or if you have certain conditions.  Meningococcal vaccine.** / Consult your health care provider.  Hepatitis A vaccine.** / Consult your health care provider.  Hepatitis B vaccine.** / Consult your health care provider.  Haemophilus influenzae type b (Hib) vaccine.** / Consult your health care provider. Ages 32 years and over  Blood pressure check.** / Every 1 to 2 years.  Lipid and cholesterol check.** / Every 5 years beginning at age 53 years.  Lung cancer screening. / Every year if you are aged 77 80 years and have a 30-pack-year history of smoking and currently smoke or have quit within the past 15 years.  Yearly screening is stopped once you have quit smoking for at least 15 years or develop a health problem that would prevent you from having lung cancer treatment.  Clinical breast exam.** / Every year after age 73 years.  BRCA-related cancer risk assessment.** / For women who have family members with a BRCA-related cancer (breast, ovarian, tubal, or peritoneal cancers).  Mammogram.** / Every year beginning at age 17 years and continuing for as long as you are in good health. Consult with your health care provider.  Pap test.** / Every 3 years starting at age 17 years through age 43 or 42 years with 3 consecutive normal Pap tests. Testing can be stopped between 65 and 70 years with 3 consecutive normal Pap tests and no abnormal Pap or HPV tests in the past 10 years.  HPV screening.** / Every 3 years from ages 69 years through ages 59 or 53 years with a history of 3 consecutive normal Pap tests. Testing can be stopped between 65 and 70 years with 3 consecutive normal Pap tests and no abnormal Pap or HPV tests in the past 10 years.  Fecal occult blood test (FOBT) of stool. / Every year beginning at age 71 years and continuing until age 64 years. You may not need to do this test if you get a colonoscopy every 10 years.  Flexible sigmoidoscopy or colonoscopy.** / Every 5 years for a flexible sigmoidoscopy or every 10 years for a colonoscopy beginning at age 28 years and continuing until age 25 years.  Hepatitis C blood test.** / For all people born from [birth year] through 1965 and any individual with known risks for hepatitis C.  Osteoporosis screening.** / A one-time screening for women ages 72 years and over and women at risk for fractures or osteoporosis.  Skin self-exam. / Monthly.  Influenza vaccine. / Every year.  Tetanus, diphtheria, and acellular pertussis (Tdap/Td) vaccine.** / 1 dose of Td every 10 years.  Varicella vaccine.** / Consult your health care provider.  Zoster vaccine.** / 1  dose for adults aged 45 years or older.  Pneumococcal 13-valent conjugate (PCV13) vaccine.** / Consult your health care provider.  Pneumococcal polysaccharide (PPSV23) vaccine.** / 1 dose for all adults aged 28 years and older.  Meningococcal vaccine.** / Consult your health care provider.  Hepatitis A vaccine.** / Consult your health care provider.  Hepatitis B vaccine.** / Consult your health care provider.  Haemophilus influenzae type b (Hib) vaccine.** / Consult your health care provider. ** Family history and personal history of risk and conditions may change your health care provider's recommendations. Document Released: 12/12/2001 Document Revised: 08/06/2013 Document Reviewed: 03/13/2011 [facility] Patient Information 2014 [place], [place].

## 2014-04-13 NOTE — Progress Notes (Signed)
Pre visit review using our clinic review tool, if applicable. No additional management support is needed unless otherwise documented below in the visit note. 

## 2014-04-14 ENCOUNTER — Telehealth: Payer: Self-pay

## 2014-04-14 ENCOUNTER — Encounter: Payer: Self-pay | Admitting: Internal Medicine

## 2014-04-14 LAB — CBC WITH DIFFERENTIAL/PLATELET
BASOS PCT: 0.6 % (ref 0.0–3.0)
Basophils Absolute: 0 10*3/uL (ref 0.0–0.1)
EOS ABS: 0.1 10*3/uL (ref 0.0–0.7)
Eosinophils Relative: 1.3 % (ref 0.0–5.0)
HEMATOCRIT: 36 % (ref 36.0–46.0)
Hemoglobin: 12.1 g/dL (ref 12.0–15.0)
LYMPHS ABS: 1.5 10*3/uL (ref 0.7–4.0)
Lymphocytes Relative: 24.7 % (ref 12.0–46.0)
MCHC: 33.7 g/dL (ref 30.0–36.0)
MCV: 84.2 fl (ref 78.0–100.0)
MONO ABS: 0.4 10*3/uL (ref 0.1–1.0)
Monocytes Relative: 6.5 % (ref 3.0–12.0)
NEUTROS ABS: 4 10*3/uL (ref 1.4–7.7)
NEUTROS PCT: 66.9 % (ref 43.0–77.0)
Platelets: 178 10*3/uL (ref 150.0–400.0)
RBC: 4.27 Mil/uL (ref 3.87–5.11)
RDW: 14.2 % (ref 11.5–15.5)
WBC: 5.9 10*3/uL (ref 4.0–10.5)

## 2014-04-14 NOTE — Telephone Encounter (Signed)
Peter Congo from [facility] called to ensure that we indeed wanted to order candida with patient's PAP.  She is was informed that the order was correct.  No further questions or concerns addressed.

## 2014-04-15 LAB — CYTOLOGY - PAP

## 2014-04-16 ENCOUNTER — Other Ambulatory Visit: Payer: Self-pay | Admitting: Family Medicine

## 2014-04-16 ENCOUNTER — Ambulatory Visit: Payer: BC Managed Care – PPO | Admitting: Licensed Clinical Social Worker

## 2014-04-22 ENCOUNTER — Encounter (INDEPENDENT_AMBULATORY_CARE_PROVIDER_SITE_OTHER): Payer: Self-pay

## 2014-04-22 ENCOUNTER — Ambulatory Visit
Admission: RE | Admit: 2014-04-22 | Discharge: 2014-04-22 | Disposition: A | Discharge status: Home / Self Care | Payer: BC Managed Care – PPO | Source: Ambulatory Visit | Attending: Family Medicine | Admitting: Family Medicine

## 2014-04-22 DIAGNOSIS — E2839 Other primary ovarian failure: Secondary | ICD-10-CM

## 2014-04-22 DIAGNOSIS — Z1231 Encounter for screening mammogram for malignant neoplasm of breast: Secondary | ICD-10-CM

## 2014-04-24 ENCOUNTER — Other Ambulatory Visit: Payer: Self-pay | Admitting: Family Medicine

## 2014-04-24 NOTE — Telephone Encounter (Signed)
Chart has documented 20 units and the refill is requested for 40 units. Please advise    KP

## 2014-04-26 ENCOUNTER — Emergency Department (HOSPITAL_BASED_OUTPATIENT_CLINIC_OR_DEPARTMENT_OTHER)
Admission: EM | Admit: 2014-04-26 | Discharge: 2014-04-26 | Disposition: A | Discharge status: Home / Self Care | Payer: BC Managed Care – PPO | Attending: Emergency Medicine | Admitting: Emergency Medicine

## 2014-04-26 ENCOUNTER — Emergency Department (HOSPITAL_BASED_OUTPATIENT_CLINIC_OR_DEPARTMENT_OTHER): Payer: BC Managed Care – PPO

## 2014-04-26 ENCOUNTER — Encounter (HOSPITAL_BASED_OUTPATIENT_CLINIC_OR_DEPARTMENT_OTHER): Payer: Self-pay | Admitting: Emergency Medicine

## 2014-04-26 DIAGNOSIS — F4310 Post-traumatic stress disorder, unspecified: Secondary | ICD-10-CM | POA: Insufficient documentation

## 2014-04-26 DIAGNOSIS — M1610 Unilateral primary osteoarthritis, unspecified hip: Secondary | ICD-10-CM | POA: Insufficient documentation

## 2014-04-26 DIAGNOSIS — I252 Old myocardial infarction: Secondary | ICD-10-CM | POA: Insufficient documentation

## 2014-04-26 DIAGNOSIS — M19049 Primary osteoarthritis, unspecified hand: Secondary | ICD-10-CM | POA: Insufficient documentation

## 2014-04-26 DIAGNOSIS — S0121XA Laceration without foreign body of nose, initial encounter: Secondary | ICD-10-CM

## 2014-04-26 DIAGNOSIS — S0120XA Unspecified open wound of nose, initial encounter: Secondary | ICD-10-CM | POA: Insufficient documentation

## 2014-04-26 DIAGNOSIS — I10 Essential (primary) hypertension: Secondary | ICD-10-CM | POA: Insufficient documentation

## 2014-04-26 DIAGNOSIS — S98919A Complete traumatic amputation of unspecified foot, level unspecified, initial encounter: Secondary | ICD-10-CM | POA: Insufficient documentation

## 2014-04-26 DIAGNOSIS — Z880 Allergy status to penicillin: Secondary | ICD-10-CM | POA: Insufficient documentation

## 2014-04-26 DIAGNOSIS — E7800 Pure hypercholesterolemia, unspecified: Secondary | ICD-10-CM | POA: Insufficient documentation

## 2014-04-26 DIAGNOSIS — N189 Chronic kidney disease, unspecified: Secondary | ICD-10-CM | POA: Insufficient documentation

## 2014-04-26 DIAGNOSIS — I129 Hypertensive chronic kidney disease with stage 1 through stage 4 chronic kidney disease, or unspecified chronic kidney disease: Secondary | ICD-10-CM | POA: Insufficient documentation

## 2014-04-26 DIAGNOSIS — F411 Generalized anxiety disorder: Secondary | ICD-10-CM | POA: Insufficient documentation

## 2014-04-26 DIAGNOSIS — Z794 Long term (current) use of insulin: Secondary | ICD-10-CM | POA: Insufficient documentation

## 2014-04-26 DIAGNOSIS — W06XXXA Fall from bed, initial encounter: Secondary | ICD-10-CM | POA: Insufficient documentation

## 2014-04-26 DIAGNOSIS — Z8781 Personal history of (healed) traumatic fracture: Secondary | ICD-10-CM | POA: Insufficient documentation

## 2014-04-26 DIAGNOSIS — Z79899 Other long term (current) drug therapy: Secondary | ICD-10-CM | POA: Insufficient documentation

## 2014-04-26 DIAGNOSIS — IMO0001 Reserved for inherently not codable concepts without codable children: Secondary | ICD-10-CM

## 2014-04-26 DIAGNOSIS — W19XXXA Unspecified fall, initial encounter: Secondary | ICD-10-CM

## 2014-04-26 DIAGNOSIS — M169 Osteoarthritis of hip, unspecified: Secondary | ICD-10-CM | POA: Insufficient documentation

## 2014-04-26 DIAGNOSIS — S0990XA Unspecified injury of head, initial encounter: Secondary | ICD-10-CM | POA: Insufficient documentation

## 2014-04-26 DIAGNOSIS — Z940 Kidney transplant status: Secondary | ICD-10-CM | POA: Insufficient documentation

## 2014-04-26 DIAGNOSIS — I209 Angina pectoris, unspecified: Secondary | ICD-10-CM | POA: Insufficient documentation

## 2014-04-26 DIAGNOSIS — D649 Anemia, unspecified: Secondary | ICD-10-CM | POA: Insufficient documentation

## 2014-04-26 DIAGNOSIS — Z9889 Other specified postprocedural states: Secondary | ICD-10-CM | POA: Insufficient documentation

## 2014-04-26 DIAGNOSIS — Y9389 Activity, other specified: Secondary | ICD-10-CM | POA: Insufficient documentation

## 2014-04-26 DIAGNOSIS — E119 Type 2 diabetes mellitus without complications: Secondary | ICD-10-CM | POA: Insufficient documentation

## 2014-04-26 DIAGNOSIS — Y9289 Other specified places as the place of occurrence of the external cause: Secondary | ICD-10-CM | POA: Insufficient documentation

## 2014-04-26 LAB — CBC WITH DIFFERENTIAL/PLATELET
BASOS PCT: 1 % (ref 0–1)
Basophils Absolute: 0 10*3/uL (ref 0.0–0.1)
EOS ABS: 0.1 10*3/uL (ref 0.0–0.7)
Eosinophils Relative: 1 % (ref 0–5)
HCT: 36.2 % (ref 36.0–46.0)
Hemoglobin: 12.3 g/dL (ref 12.0–15.0)
Lymphocytes Relative: 24 % (ref 12–46)
Lymphs Abs: 1.5 10*3/uL (ref 0.7–4.0)
MCH: 28.3 pg (ref 26.0–34.0)
MCHC: 34 g/dL (ref 30.0–36.0)
MCV: 83.2 fL (ref 78.0–100.0)
Monocytes Absolute: 0.6 10*3/uL (ref 0.1–1.0)
Monocytes Relative: 9 % (ref 3–12)
NEUTROS ABS: 4 10*3/uL (ref 1.7–7.7)
NEUTROS PCT: 65 % (ref 43–77)
Platelets: 145 10*3/uL — ABNORMAL LOW (ref 150–400)
RBC: 4.35 MIL/uL (ref 3.87–5.11)
RDW: 13.4 % (ref 11.5–15.5)
WBC: 6.1 10*3/uL (ref 4.0–10.5)

## 2014-04-26 LAB — COMPREHENSIVE METABOLIC PANEL
ALK PHOS: 100 U/L (ref 39–117)
ALT: 21 U/L (ref 0–35)
AST: 20 U/L (ref 0–37)
Albumin: 3.8 g/dL (ref 3.5–5.2)
BILIRUBIN TOTAL: 0.4 mg/dL (ref 0.3–1.2)
BUN: 27 mg/dL — AB (ref 6–23)
CHLORIDE: 97 meq/L (ref 96–112)
CO2: 26 mEq/L (ref 19–32)
Calcium: 10 mg/dL (ref 8.4–10.5)
Creatinine, Ser: 1.3 mg/dL — ABNORMAL HIGH (ref 0.50–1.10)
GFR calc non Af Amer: 44 mL/min — ABNORMAL LOW (ref >90)
GFR, EST AFRICAN AMERICAN: 51 mL/min — AB (ref >90)
GLUCOSE: 520 mg/dL — AB (ref 70–99)
POTASSIUM: 5.2 meq/L (ref 3.7–5.3)
Sodium: 135 mEq/L — ABNORMAL LOW (ref 137–147)
Total Protein: 7.6 g/dL (ref 6.0–8.3)

## 2014-04-26 LAB — CBG MONITORING, ED: GLUCOSE-CAPILLARY: 437 mg/dL — AB (ref 70–99)

## 2014-04-26 MED ORDER — OXYCODONE-ACETAMINOPHEN 5-325 MG PO TABS: 1.0000 | ORAL_TABLET | ORAL | Status: DC | PRN

## 2014-04-26 MED ORDER — OXYCODONE-ACETAMINOPHEN 5-325 MG PO TABS
2.0000 | ORAL_TABLET | Freq: Once | ORAL | Status: AC
  Administered 2014-04-26: 2 via ORAL
  Filled 2014-04-26: qty 2
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
## 2014-04-26 NOTE — ED Notes (Signed)
Pt reports falling out of bed while dreaming this morning.  Noted to have a bruise and hematoma on her forehead and a laceration and abrasion to her nose.  Pt A/O after the fall according to her husband.

## 2014-04-26 NOTE — ED Provider Notes (Signed)
LACERATION REPAIR Performed by: Charlann Lange A Authorized by: Charlann Lange A Consent: Verbal consent obtained. Risks and benefits: risks, benefits and alternatives were discussed Consent given by: patient Patient identity confirmed: provided demographic data Prepped and Draped in normal sterile fashion Wound explored  Laceration Location: tip of nose  Laceration Length: 1cm  No Foreign Bodies seen or palpated  Anesthesia: local infiltration  Local anesthetic: lidocaine 1% w/o epinephrine  Anesthetic total: 1 ml  Irrigation method: syringe Amount of cleaning: standard  Skin closure: 7-0 prolene  Number of sutures: 5  Technique: simple interrupted  Patient tolerance: Patient tolerated the procedure well with no immediate complications.   Dewaine Oats, PA-C 04/26/14 1434

## 2014-04-26 NOTE — Discharge Instructions (Signed)
°  Sutures out in 5 days.  Return immediately if any signs of infection such as increased swelling, redness, or discharge.   Facial Laceration A facial laceration is a cut on the face. These injuries can be painful and cause bleeding. Some cuts may need to be closed with stitches (sutures), skin adhesive strips, or wound glue. Cuts usually heal quickly but can leave a scar. It can take 1-2 years for the scar to go away completely. HOME CARE   Only take medicines as told by your doctor.  Follow your doctor's instructions for wound care. For Stitches:  Keep the cut clean and dry.  If you have a bandage (dressing), change it at least once a day. Change the bandage if it gets wet or dirty, or as told by your doctor.  Wash the cut with soap and water 2 times a day. Rinse the cut with water. Pat it dry with a clean towel.  Put a thin layer of medicated cream on the cut as told by your doctor.  You may shower after the first 24 hours. Do not soak the cut in water until the stitches are removed.  Have your stitches removed as told by your doctor.  Do not wear any makeup until a few days after your stitches are removed. For Skin Adhesive Strips:  Keep the cut clean and dry.  Do not get the strips wet. You may take a bath, but be careful to keep the cut dry.  If the cut gets wet, pat it dry with a clean towel.  The strips will fall off on their own. Do not remove the strips that are still stuck to the cut. For Wound Glue:  You may shower or take baths. Do not soak or scrub the cut. Do not swim. Avoid heavy sweating until the glue falls off on its own. After a shower or bath, pat the cut dry with a clean towel.  Do not put medicine or makeup on your cut until the glue falls off.  If you have a bandage, do not put tape over the glue.  Avoid lots of sunlight or tanning lamps until the glue falls off.  The glue will fall off on its own in 5-10 days. Do not pick at the glue. After  Healing: Put sunscreen on the cut for the first year to reduce your scar. GET HELP RIGHT AWAY IF:   Your cut area gets red, painful, or puffy (swollen).  You see a yellowish-white fluid (pus) coming from the cut.  You have chills or a fever. MAKE SURE YOU:   Understand these instructions.  Will watch your condition.  Will get help right away if you are not doing well or get worse. Document Released: 04/03/2008 Document Revised: 08/06/2013 Document Reviewed: 05/29/2013 [facility] Patient Information 2015 [place], [place]. This information is not intended to replace advice given to you by your health care provider. Make sure you discuss any questions you have with your health care provider.

## 2014-04-26 NOTE — ED Provider Notes (Signed)
History/physical exam/procedure(s) were performed by non-physician practitioner and as supervising physician I was immediately available for consultation/collaboration. I have reviewed all notes and am in agreement with care and plan.   Shaune Pollack, MD 04/26/14 1539

## 2014-05-02 ENCOUNTER — Other Ambulatory Visit: Payer: Self-pay | Admitting: Family Medicine

## 2014-05-08 ENCOUNTER — Encounter: Payer: Self-pay | Admitting: Family Medicine

## 2014-05-12 ENCOUNTER — Telehealth: Payer: Self-pay | Admitting: Family Medicine

## 2014-05-12 NOTE — Telephone Encounter (Signed)
Spoke with Jinny Blossom from [facility] who states that the patient has been experiencing RLQ for the past several weeks with exacerbating symptoms today that includes the back and non stop RLQ pain. Advised [facility] to recommend patient go to the ED.  Agrees with plan.

## 2014-05-12 NOTE — Telephone Encounter (Signed)
Patient Information:  Caller Name: [name]  Phone: [phone]  Patient: [name] [name]  Gender: Female  DOB: 1955-12-12  Age: 58 Years  PCP: Rosalita Chessman.  Office Follow Up:  Does the office need to follow up with this patient?: No  Instructions For The Office: N/A   Symptoms  Reason For Call & Symptoms: RLQ pain and left lower back pain x 2 weeks. Abdominal pain is described as sharp and sudden. Back pain is described as throbbing and aching. Patient calls today because abdominal pain has been constant all day. Report called to [facility] in office who agrees with ED evaluation. Patient instructed to proceed to ED for evaluation . Patient agreeable.  Reviewed Health History In EMR: Yes  Reviewed Medications In EMR: Yes  Reviewed Allergies In EMR: Yes  Reviewed Surgeries / Procedures: Yes  Date of Onset of Symptoms: 04/26/2014  Treatments Tried: Aleve  Treatments Tried Worked: No  Guideline(s) Used:  Abdominal Pain - Female  Back Pain  Disposition Per Guideline:   Go to ED Now (or to Office with PCP Approval)  Reason For Disposition Reached:   Constant abdominal pain lasting > 2 hours  Advice Given:  N/A  Patient Will Follow Care Advice:  YES

## 2014-05-21 ENCOUNTER — Ambulatory Visit (INDEPENDENT_AMBULATORY_CARE_PROVIDER_SITE_OTHER): Payer: BC Managed Care – PPO | Admitting: Licensed Clinical Social Worker

## 2014-05-21 ENCOUNTER — Encounter: Payer: Self-pay | Admitting: Physician Assistant

## 2014-05-21 ENCOUNTER — Ambulatory Visit (INDEPENDENT_AMBULATORY_CARE_PROVIDER_SITE_OTHER): Payer: BC Managed Care – PPO | Admitting: Physician Assistant

## 2014-05-21 VITALS — BP 118/76 | HR 77 | Temp 98.0°F | Resp 16 | Ht 62.0 in | Wt 184.8 lb

## 2014-05-21 DIAGNOSIS — F4310 Post-traumatic stress disorder, unspecified: Secondary | ICD-10-CM

## 2014-05-21 DIAGNOSIS — R05 Cough: Secondary | ICD-10-CM

## 2014-05-21 DIAGNOSIS — J019 Acute sinusitis, unspecified: Secondary | ICD-10-CM

## 2014-05-21 DIAGNOSIS — R059 Cough, unspecified: Secondary | ICD-10-CM

## 2014-05-21 MED ORDER — BENZONATATE 200 MG PO CAPS: 200.0000 mg | ORAL_CAPSULE | Freq: Two times a day (BID) | ORAL | Status: DC | PRN

## 2014-05-21 MED ORDER — AZITHROMYCIN 250 MG PO TABS: ORAL_TABLET | ORAL | Status: DC

## 2014-05-21 NOTE — Assessment & Plan Note (Signed)
Rx Azithromycin.  Tessalon for cough. Increase fluids.  Rest.  Saline nasal spray.  Use Flonase daily.  Plain Mucinex.  Humidifier in bedroom.  Return precautions discussed with patient.

## 2014-05-21 NOTE — Progress Notes (Signed)
Pre visit review using our clinic review tool, if applicable. No additional management support is needed unless otherwise documented below in the visit note/SLS  

## 2014-05-21 NOTE — Patient Instructions (Signed)
Please take antibiotic as directed.  Increase fluid intake.  Use Saline nasal spray.  Take a daily multivitamin. Use Tessalon for cough.  Continue Flonase daily.  Place a humidifier in the bedroom.  Please call or return clinic if symptoms are not improving.  Sinusitis Sinusitis is redness, soreness, and swelling (inflammation) of the paranasal sinuses. Paranasal sinuses are air pockets within the bones of your face (beneath the eyes, the middle of the forehead, or above the eyes). In healthy paranasal sinuses, mucus is able to drain out, and air is able to circulate through them by way of your nose. However, when your paranasal sinuses are inflamed, mucus and air can become trapped. This can allow bacteria and other germs to grow and cause infection. Sinusitis can develop quickly and last only a short time (acute) or continue over a long period (chronic). Sinusitis that lasts for more than 12 weeks is considered chronic.  CAUSES  Causes of sinusitis include:  Allergies.  Structural abnormalities, such as displacement of the cartilage that separates your nostrils (deviated septum), which can decrease the air flow through your nose and sinuses and affect sinus drainage.  Functional abnormalities, such as when the small hairs (cilia) that line your sinuses and help remove mucus do not work properly or are not present. SYMPTOMS  Symptoms of acute and chronic sinusitis are the same. The primary symptoms are pain and pressure around the affected sinuses. Other symptoms include:  Upper toothache.  Earache.  Headache.  Bad breath.  Decreased sense of smell and taste.  A cough, which worsens when you are lying flat.  Fatigue.  Fever.  Thick drainage from your nose, which often is green and may contain pus (purulent).  Swelling and warmth over the affected sinuses. DIAGNOSIS  Your caregiver will perform a physical exam. During the exam, your caregiver may:  Look in your nose for signs of  abnormal growths in your nostrils (nasal polyps).  Tap over the affected sinus to check for signs of infection.  View the inside of your sinuses (endoscopy) with a special imaging device with a light attached (endoscope), which is inserted into your sinuses. If your caregiver suspects that you have chronic sinusitis, one or more of the following tests may be recommended:  Allergy tests.  Nasal culture A sample of mucus is taken from your nose and sent to a lab and screened for bacteria.  Nasal cytology A sample of mucus is taken from your nose and examined by your caregiver to determine if your sinusitis is related to an allergy. TREATMENT  Most cases of acute sinusitis are related to a viral infection and will resolve on their own within 10 days. Sometimes medicines are prescribed to help relieve symptoms (pain medicine, decongestants, nasal steroid sprays, or saline sprays).  However, for sinusitis related to a bacterial infection, your caregiver will prescribe antibiotic medicines. These are medicines that will help kill the bacteria causing the infection.  Rarely, sinusitis is caused by a fungal infection. In theses cases, your caregiver will prescribe antifungal medicine. For some cases of chronic sinusitis, surgery is needed. Generally, these are cases in which sinusitis recurs more than 3 times per year, despite other treatments. HOME CARE INSTRUCTIONS   Drink plenty of water. Water helps thin the mucus so your sinuses can drain more easily.  Use a humidifier.  Inhale steam 3 to 4 times a day (for example, sit in the bathroom with the shower running).  Apply a warm, moist washcloth to  your face 3 to 4 times a day, or as directed by your caregiver.  Use saline nasal sprays to help moisten and clean your sinuses.  Take over-the-counter or prescription medicines for pain, discomfort, or fever only as directed by your caregiver. SEEK IMMEDIATE MEDICAL CARE IF:  You have increasing  pain or severe headaches.  You have nausea, vomiting, or drowsiness.  You have swelling around your face.  You have vision problems.  You have a stiff neck.  You have difficulty breathing. MAKE SURE YOU:   Understand these instructions.  Will watch your condition.  Will get help right away if you are not doing well or get worse. Document Released: 10/16/2005 Document Revised: 01/08/2012 Document Reviewed: 10/31/2011 [facility] Patient Information 2014 [place], [place].

## 2014-05-21 NOTE — Progress Notes (Signed)
Patient presents to clinic today c/o 2 weeks of sinus pressure, sinus pain, bilateral ear pain and nonproductive cough.  Patient denies fever, chills.  Endorses fatigue.  Denies recent travel or sick contact.    Past Medical History  Diagnosis Date  . Hypertension   . Fibromyalgia   . High cholesterol   . PTSD (post-traumatic stress disorder)   . Anginal pain   . Proximal humerus fracture 10/15/12    Left  . Diabetes mellitus     insulin dependent, adult onset  . Anemia   . Fatty liver   . Anxiety   . Acute MI 2007    presented to ED & had cardiac cath- but found to have normal coronaries. Since that point in time her PCP cares f or cardiac needs. Dr. [name] - [facility]  . Exertional shortness of breath   . Osteoarthritis     hands, hips  . Chronic kidney disease     "had transplant when I was 30; doesn't bother me now" (03/20/2013)    Current Outpatient Prescriptions on File Prior to Visit  Medication Sig Dispense Refill  . atorvastatin (LIPITOR) 10 MG tablet Take 1 tablet (10 mg total) by mouth daily.  30 tablet  5  . Calcium Carbonate-Vitamin D (CALTRATE 600+D) 600-400 MG-UNIT per tablet Take 1 tablet by mouth daily.       . Cholecalciferol (VITAMIN D3) 5000 UNITS CAPS Take 1 capsule by mouth 2 (two) times daily.      [name] Kitchen CINNAMON PO Take 1,000 mg by mouth daily.      . DULoxetine (CYMBALTA) 60 MG capsule TAKE ONE CAPSULE BY MOUTH ONCE DAILY  30 capsule  5  . escitalopram (LEXAPRO) 20 MG tablet Take 1 tablet (20 mg total) by mouth daily.  30 tablet  5  . ferrous sulfate 325 (65 FE) MG tablet Take 325 mg by mouth 2 (two) times daily.       . Flaxseed, Linseed, (FLAX SEED OIL) 1000 MG CAPS Take 1 capsule by mouth at bedtime.      . fluticasone (FLONASE) 50 MCG/ACT nasal spray Place 2 sprays into both nostrils daily.  16 g  6  . gabapentin (NEURONTIN) 300 MG capsule TAKE TWO CAPSULES BY MOUTH THREE TIMES DAILY  180 capsule  5  . hydrochlorothiazide (HYDRODIURIL) 25 MG tablet  Take 1 tablet (25 mg total) by mouth daily.  30 tablet  11  . HYDROcodone-ibuprofen (VICOPROFEN) 7.5-200 MG per tablet TAKE ONE TABLET BY MOUTH EVERY 6 HOURS AS NEEDED  50 tablet  0  . insulin aspart (NOVOLOG FLEXPEN) 100 UNIT/ML FlexPen 15 units with breakfast and 16 units with the evening meal, and pen needles 3/day  15 mL  11  . insulin glargine (LANTUS) 100 units/mL SOLN Inject 20 Units into the skin at bedtime.       [name] Kitchen LANTUS SOLOSTAR 100 UNIT/ML Solostar Pen INJECT 40 UNITS INTO THE SKIN AT BEDTIME  15 mL  1  . lisinopril (PRINIVIL,ZESTRIL) 5 MG tablet Take 1 tablet by mouth daily.      . metaxalone (SKELAXIN) 800 MG tablet Take 1 tablet (800 mg total) by mouth 3 (three) times daily.  30 tablet  1  . Multiple Vitamin (MULTIVITAMIN) tablet Take 1 tablet by mouth daily.       . ondansetron (ZOFRAN ODT) 4 MG disintegrating tablet Take 1 tablet (4 mg total) by mouth every 8 (eight) hours as needed for nausea or vomiting.  20 tablet  0  . polyethylene glycol (MIRALAX / GLYCOLAX) packet Take 17 g by mouth daily.        . vitamin C (ASCORBIC ACID) 500 MG tablet Take 500 mg by mouth daily.      . [DISCONTINUED] metFORMIN (GLUCOPHAGE) 1000 MG tablet Take 1,000 mg by mouth 2 (two) times daily with a meal.        . [DISCONTINUED] omeprazole (PRILOSEC) 20 MG capsule Take 20 mg by mouth daily.         No current facility-administered medications on file prior to visit.    Allergies  Allergen Reactions  . Fish-Derived Products Hives, Shortness Of Breath, Swelling and Rash    Hives get in throat causing trouble breathing  . Mushroom Extract Complex Anaphylaxis  . Penicillins Anaphylaxis and Swelling  . Shellfish Allergy Hives, Shortness Of Breath, Swelling and Rash  . Tomato Hives and Shortness Of Breath    Hives in throat causes her trouble breathing  . Aloe Vera     hives  . Rosemary Oil     Family History  Problem Relation Age of Onset  . Kidney disease Brother   . Diabetes Brother   .  Heart disease Brother 16  . Kidney failure Brother   . Heart disease Father   . Diabetes Father   . Colitis Father   . Crohn's disease Father   . Cancer Father     leukemia  . Leukemia Father   . Diabetes Mother   . Hypertension Mother   . Mental illness Mother   . Irritable bowel syndrome Daughter     History   Social History  . Marital Status: Married    Spouse Name: N/A    Number of Children: 1  . Years of Education: N/A   Occupational History  . transports organs for transplantation    Social History Main Topics  . Smoking status: Never Smoker   . Smokeless tobacco: Never Used  . Alcohol Use: No  . Drug Use: No  . Sexual Activity: No   Other Topics Concern  . None   Social History Narrative   Widowed once,and divorced once   Lives with husband.  She had one living child and three who had deceased (one killed by drunk driver, one died at age of 17-days due to heart problems, and other at the age of 5 due to heart problems)   She is a homemaker currently.  She was previously working as a Armed forces training and education officer.   She lost one child in the 90's   Daily caffeine   Review of Systems - See HPI.  All other ROS are negative.  BP 118/76  Pulse 77  Temp(Src) 98 F (36.7 C) (Oral)  Resp 16  Ht 5' 2"  (1.575 m)  Wt 184 lb 12 oz (83.802 kg)  BMI 33.78 kg/m2  SpO2 98%  Physical Exam  Vitals reviewed. Constitutional: She is oriented to person, place, and time and well-developed, well-nourished, and in no distress.  HENT:  Head: Normocephalic and atraumatic.  Right Ear: External ear normal.  Left Ear: External ear normal.  Nose: Nose normal.  Mouth/Throat: Oropharynx is clear and moist. No oropharyngeal exudate.  TM with scant clear fluid noted posteriorly.  No evidence of AOM.  + TTP of sinuses on exam with R>L  Cardiovascular: Normal rate, regular rhythm, normal heart sounds and intact distal pulses.   Pulmonary/Chest: Effort normal and breath sounds normal.  No respiratory distress. She has no  wheezes. She has no rales. She exhibits no tenderness.  Neurological: She is alert and oriented to person, place, and time.  Skin: Skin is warm and dry. No rash noted.  Psychiatric: Affect normal.    Recent Results (from the past 2160 hour(s))  BASIC METABOLIC PANEL     Status: Abnormal   Collection Time    03/03/14  2:51 PM      Result Value Ref Range   Sodium 134 (*) 135 - 145 mEq/L   Potassium 4.3  3.5 - 5.1 mEq/L   Chloride 98  96 - 112 mEq/L   CO2 28  19 - 32 mEq/L   Glucose, Bld 468 (*) 70 - 99 mg/dL   BUN 20  6 - 23 mg/dL   Creatinine, Ser 1.4 (*) 0.4 - 1.2 mg/dL   Calcium 9.3  8.4 - 10.5 mg/dL   GFR 41.03 (*) >60.00 mL/min  CBC WITH DIFFERENTIAL     Status: Abnormal   Collection Time    03/03/14  2:51 PM      Result Value Ref Range   WBC 4.9  4.0 - 10.5 K/uL   RBC 4.19  3.87 - 5.11 Mil/uL   Hemoglobin 11.8 (*) 12.0 - 15.0 g/dL   HCT 35.7 (*) 36.0 - 46.0 %   MCV 85.3  78.0 - 100.0 fl   MCHC 33.1  30.0 - 36.0 g/dL   RDW 15.2  11.5 - 15.5 %   Platelets 162.0  150.0 - 400.0 K/uL   Neutrophils Relative % 63.9  43.0 - 77.0 %   Lymphocytes Relative 27.5  12.0 - 46.0 %   Monocytes Relative 7.0  3.0 - 12.0 %   Eosinophils Relative 1.2  0.0 - 5.0 %   Basophils Relative 0.4  0.0 - 3.0 %   Neutro Abs 3.2  1.4 - 7.7 K/uL   Lymphs Abs 1.4  0.7 - 4.0 K/uL   Monocytes Absolute 0.3  0.1 - 1.0 K/uL   Eosinophils Absolute 0.1  0.0 - 0.7 K/uL   Basophils Absolute 0.0  0.0 - 0.1 K/uL  HEMOGLOBIN A1C     Status: Abnormal   Collection Time    03/03/14  2:51 PM      Result Value Ref Range   Hemoglobin A1C 14.4 (*) 4.6 - 6.5 %   Comment: Glycemic Control Guidelines for People with Diabetes:Non Diabetic:  <6%Goal of Therapy: <7%Additional Action Suggested:  >8%   HEPATIC FUNCTION PANEL     Status: None   Collection Time    03/03/14  2:51 PM      Result Value Ref Range   Total Bilirubin 0.6  0.2 - 1.2 mg/dL   Bilirubin, Direct 0.1  0.0 - 0.3 mg/dL    Alkaline Phosphatase 76  39 - 117 U/L   AST 19  0 - 37 U/L   ALT 19  0 - 35 U/L   Total Protein 6.8  6.0 - 8.3 g/dL   Albumin 3.5  3.5 - 5.2 g/dL  LIPID PANEL     Status: Abnormal   Collection Time    03/03/14  2:51 PM      Result Value Ref Range   Cholesterol 118  0 - 200 mg/dL   Comment: ATP III Classification       Desirable:  < 200 mg/dL               Borderline High:  200 - 239 mg/dL  High:  > = 240 mg/dL   Triglycerides 106.0  0.0 - 149.0 mg/dL   Comment: Normal:  <150 mg/dLBorderline High:  150 - 199 mg/dL   HDL 37.20 (*) >39.00 mg/dL   VLDL 21.2  0.0 - 40.0 mg/dL   LDL Cholesterol 60  0 - 99 mg/dL   Total CHOL/HDL Ratio 3     Comment:                Men          Women1/2 Average Risk     3.4          3.3Average Risk          5.0          4.42X Average Risk          9.6          7.13X Average Risk          15.0          11.0                      CYTOLOGY - PAP     Status: None   Collection Time    04/13/14 12:00 AM      Result Value Ref Range   CYTOLOGY - PAP PAP RESULT    CBC WITH DIFFERENTIAL     Status: None   Collection Time    04/13/14  2:37 PM      Result Value Ref Range   WBC 5.9  4.0 - 10.5 K/uL   RBC 4.27  3.87 - 5.11 Mil/uL   Hemoglobin 12.1  12.0 - 15.0 g/dL   HCT 36.0  36.0 - 46.0 %   MCV 84.2  78.0 - 100.0 fl   MCHC 33.7  30.0 - 36.0 g/dL   RDW 14.2  11.5 - 15.5 %   Platelets 178.0  150.0 - 400.0 K/uL   Neutrophils Relative % 66.9  43.0 - 77.0 %   Lymphocytes Relative 24.7  12.0 - 46.0 %   Monocytes Relative 6.5  3.0 - 12.0 %   Eosinophils Relative 1.3  0.0 - 5.0 %   Basophils Relative 0.6  0.0 - 3.0 %   Neutro Abs 4.0  1.4 - 7.7 K/uL   Lymphs Abs 1.5  0.7 - 4.0 K/uL   Monocytes Absolute 0.4  0.1 - 1.0 K/uL   Eosinophils Absolute 0.1  0.0 - 0.7 K/uL   Basophils Absolute 0.0  0.0 - 0.1 K/uL  CBG MONITORING, ED     Status: Abnormal   Collection Time    04/26/14 12:15 PM      Result Value Ref Range   Glucose-Capillary 437 (*) 70 - 99 mg/dL   CBC WITH DIFFERENTIAL     Status: Abnormal   Collection Time    04/26/14  1:00 PM      Result Value Ref Range   WBC 6.1  4.0 - 10.5 K/uL   RBC 4.35  3.87 - 5.11 MIL/uL   Hemoglobin 12.3  12.0 - 15.0 g/dL   HCT 36.2  36.0 - 46.0 %   MCV 83.2  78.0 - 100.0 fL   MCH 28.3  26.0 - 34.0 pg   MCHC 34.0  30.0 - 36.0 g/dL   RDW 13.4  11.5 - 15.5 %   Platelets 145 (*) 150 - 400 K/uL   Neutrophils Relative % 65  43 - 77 %   Neutro Abs 4.0  1.7 - 7.7 K/uL   Lymphocytes Relative 24  12 - 46 %   Lymphs Abs 1.5  0.7 - 4.0 K/uL   Monocytes Relative 9  3 - 12 %   Monocytes Absolute 0.6  0.1 - 1.0 K/uL   Eosinophils Relative 1  0 - 5 %   Eosinophils Absolute 0.1  0.0 - 0.7 K/uL   Basophils Relative 1  0 - 1 %   Basophils Absolute 0.0  0.0 - 0.1 K/uL  COMPREHENSIVE METABOLIC PANEL     Status: Abnormal   Collection Time    04/26/14  1:00 PM      Result Value Ref Range   Sodium 135 (*) 137 - 147 mEq/L   Potassium 5.2  3.7 - 5.3 mEq/L   Chloride 97  96 - 112 mEq/L   CO2 26  19 - 32 mEq/L   Glucose, Bld 520 (*) 70 - 99 mg/dL   BUN 27 (*) 6 - 23 mg/dL   Creatinine, Ser 1.30 (*) 0.50 - 1.10 mg/dL   Calcium 10.0  8.4 - 10.5 mg/dL   Total Protein 7.6  6.0 - 8.3 g/dL   Albumin 3.8  3.5 - 5.2 g/dL   AST 20  0 - 37 U/L   ALT 21  0 - 35 U/L   Alkaline Phosphatase 100  39 - 117 U/L   Total Bilirubin 0.4  0.3 - 1.2 mg/dL   GFR calc non Af Amer 44 (*) >90 mL/min   GFR calc Af Amer 51 (*) >90 mL/min   Comment: (NOTE)     The eGFR has been calculated using the CKD EPI equation.     This calculation has not been validated in all clinical situations.     eGFR's persistently <90 mL/min signify possible Chronic Kidney     Disease.   Assessment/Plan: Acute sinusitis with symptoms > 10 days Rx Azithromycin.  Tessalon for cough. Increase fluids.  Rest.  Saline nasal spray.  Use Flonase daily.  Plain Mucinex.  Humidifier in bedroom.  Return precautions discussed with patient.

## 2014-05-26 ENCOUNTER — Other Ambulatory Visit: Payer: Self-pay | Admitting: Family Medicine

## 2014-05-27 ENCOUNTER — Ambulatory Visit (INDEPENDENT_AMBULATORY_CARE_PROVIDER_SITE_OTHER): Payer: BC Managed Care – PPO | Admitting: Medical

## 2014-05-27 ENCOUNTER — Encounter: Payer: Self-pay | Admitting: Medical

## 2014-05-27 ENCOUNTER — Telehealth: Payer: Self-pay | Admitting: Family Medicine

## 2014-05-27 VITALS — BP 131/71 | HR 88 | Temp 98.0°F | Wt 183.0 lb

## 2014-05-27 DIAGNOSIS — L089 Local infection of the skin and subcutaneous tissue, unspecified: Secondary | ICD-10-CM | POA: Insufficient documentation

## 2014-05-27 DIAGNOSIS — N183 Chronic kidney disease, stage 3 unspecified: Secondary | ICD-10-CM | POA: Insufficient documentation

## 2014-05-27 DIAGNOSIS — J019 Acute sinusitis, unspecified: Secondary | ICD-10-CM

## 2014-05-27 DIAGNOSIS — R799 Abnormal finding of blood chemistry, unspecified: Secondary | ICD-10-CM

## 2014-05-27 DIAGNOSIS — R7989 Other specified abnormal findings of blood chemistry: Secondary | ICD-10-CM

## 2014-05-27 MED ORDER — LEVOFLOXACIN 500 MG PO TABS: 500.0000 mg | ORAL_TABLET | Freq: Every day | ORAL | Status: DC

## 2014-05-27 MED ORDER — MUPIROCIN 2 % EX OINT: 1.0000 "application " | TOPICAL_OINTMENT | Freq: Two times a day (BID) | CUTANEOUS | Status: DC

## 2014-05-27 NOTE — Assessment & Plan Note (Signed)
Pt symptoms persisted despite azithromycin. She states took all tabs. Did rx levofloxin. Regarding peristent cough she will call me and let me know which cough medicine she has used and knows for sure no prior reaction. Then will prescribe that.

## 2014-05-27 NOTE — Progress Notes (Signed)
   Subjective:    [name], female    DOB: [birth year], 58 y.o.   MRN: [id number]  HPI Pt in reporting 3 wks of uri type illness. Pt states still has sinus pressure. Her ear pain from last visit when saw Elyn Aquas PA-C is improved after zpack(But sinus pressure persists). She was also given cough tabs and rx of nasal spray. The nasal spray helped ear pressure. The most predominant symptom is maxillary sinus pressure. Pt feels fever off and on.  Pt also had red raised area of skin rt groin area 2 days after seen at [facility] This area was also warm and tender. She had fever and then had yellow drainage. One day she touched and area burst open. This area is improved but still present to some degree.  Pt is diabetic and severe in past.    Review of Systems  Constitutional: Negative for fever, chills and fatigue.  HENT: Positive for congestion and sinus pressure. Negative for facial swelling, postnasal drip, rhinorrhea, sore throat and tinnitus.   Respiratory: Positive for cough. Negative for chest tightness and wheezing.   Cardiovascular: Negative for chest pain and palpitations.  Genitourinary: Negative.   Skin:       Rt lower abdomen/suprapubic region red and tender.   Neurological: Negative.   Hematological: Negative.        Objective:   Physical Exam  General  Mental Status - Alert. General Appearance - Well groomed. Not in acute distress.  Skin Rashes- No Rashes.  HEENT Head- Normal. Ear Auditory Canal - Left- Normal. Right - Normal.Tympanic Membrane- Left- Normal. Right- Normal. Eye Sclera/Conjunctiva- Left- Normal. Right- Normal. Nose & Sinuses Nasal Mucosa- Left- mild  Boggy and Congested. Right- Mild  Boggy and Congested. Maxillary tenderness bilateral moderate to severe. Mouth & Throat Lips: Upper Lip- Normal: no dryness, cracking, pallor, cyanosis, or vesicular eruption. Lower Lip-Normal: no dryness, cracking, pallor, cyanosis or vesicular  eruption. Buccal Mucosa- Bilateral- No Aphthous ulcers. Oropharynx- No Discharge or Erythema. Tonsils: Characteristics- Bilateral- No Erythema or Congestion. Size/Enlargement- Bilateral- No enlargement. Discharge- bilateral-None.  Neck Neck- Supple. No Masses.   Chest and Lung Exam Auscultation: Breath Sounds:-Normal.  Cardiovascular Auscultation:Rythm- Regular,rate and rythm. Murmurs & Other Heart Sounds:Ausculatation of the heart reveal- No Murmurs.  Lymphatic Head & Neck General Head & Neck Lymphatics: Bilateral: Description- No Localized lymphadenopathy.  Skin Rt lower side abdomen just lateral to suprapubic area 2 cm mild red area with central scab. No dc. Faint tender. No fluctuance.       Assessment & Plan:

## 2014-05-27 NOTE — Patient Instructions (Addendum)
Please take levofloxin for sinus infection and for skin infection. Continue nasal steroid.  Can apply mupirocin twice daily to skin area. Call me on cough medicine that she knows she can take and had no prior reaction. Follow up in 10 days or as needed.

## 2014-05-27 NOTE — Assessment & Plan Note (Signed)
Checked last month. In 10 days when follow ups plan to recheck.

## 2014-05-27 NOTE — Assessment & Plan Note (Signed)
Appears to have residual infection after hx of drained abscess. Will see if responds to levofloxin. and  Can apply mupirocin as well. Follow up in 10 days recheck sinus and skin infection region.

## 2014-05-27 NOTE — Progress Notes (Signed)
Pre visit review using our clinic review tool, if applicable. No additional management support is needed unless otherwise documented below in the visit note. 

## 2014-05-27 NOTE — Telephone Encounter (Signed)
Caller name: Zailey Relation to pt: Call back [place]: [place], [place]  Reason for call:  Pt called with the medication she was taking; this is the mediation that is not working =  Chaeratussin AC Syrup.  Percell Miller wanted to know this.

## 2014-05-28 NOTE — Telephone Encounter (Signed)
Pt states that the cough medicine did not help listed below . Pt states does not remember the name of the medication that dr Etter Sjogren gave her that worked in the past but she described them as little white pills .[name] KitchenMarland Kitchen

## 2014-05-28 NOTE — Telephone Encounter (Signed)
Will call pt regarding cough medication she has used. Will clarify. I understood benzonatate did not work. I wanted to know of med that worked in past but no reaction as she has fairly long allergy list. She sent message saying codeine based cough syrup did not work.

## 2014-06-01 ENCOUNTER — Telehealth: Payer: Self-pay | Admitting: Medical

## 2014-06-01 NOTE — Telephone Encounter (Signed)
Will advise pt to try delsym otc due to allergy hx severe various meds. Robutissin ac did not work.

## 2014-06-03 NOTE — Telephone Encounter (Signed)
Pt.notified

## 2014-06-16 ENCOUNTER — Ambulatory Visit (INDEPENDENT_AMBULATORY_CARE_PROVIDER_SITE_OTHER): Payer: BC Managed Care – PPO | Admitting: Licensed Clinical Social Worker

## 2014-06-16 ENCOUNTER — Encounter: Payer: Self-pay | Admitting: Family Medicine

## 2014-06-16 ENCOUNTER — Ambulatory Visit (INDEPENDENT_AMBULATORY_CARE_PROVIDER_SITE_OTHER): Payer: BC Managed Care – PPO | Admitting: Family Medicine

## 2014-06-16 VITALS — BP 112/66 | HR 79 | Temp 98.4°F | Wt 177.0 lb

## 2014-06-16 DIAGNOSIS — L02211 Cutaneous abscess of abdominal wall: Secondary | ICD-10-CM | POA: Insufficient documentation

## 2014-06-16 DIAGNOSIS — L02219 Cutaneous abscess of trunk, unspecified: Secondary | ICD-10-CM

## 2014-06-16 DIAGNOSIS — R109 Unspecified abdominal pain: Secondary | ICD-10-CM

## 2014-06-16 DIAGNOSIS — L03319 Cellulitis of trunk, unspecified: Secondary | ICD-10-CM

## 2014-06-16 DIAGNOSIS — F4310 Post-traumatic stress disorder, unspecified: Secondary | ICD-10-CM

## 2014-06-16 DIAGNOSIS — E1149 Type 2 diabetes mellitus with other diabetic neurological complication: Secondary | ICD-10-CM

## 2014-06-16 MED ORDER — LOSARTAN POTASSIUM 25 MG PO TABS: 25.0000 mg | ORAL_TABLET | Freq: Every day | ORAL | Status: DC

## 2014-06-16 MED ORDER — INSULIN GLULISINE 100 UNIT/ML SOLOSTAR PEN: PEN_INJECTOR | SUBCUTANEOUS | Status: DC

## 2014-06-16 NOTE — Patient Instructions (Signed)

## 2014-06-16 NOTE — Progress Notes (Signed)
   Subjective:    [name], female    DOB: 1956-01-22, 58 y.o.   MRN: [id number]  HPI Pt here to f/u abscess on low abd -- pt states she finished the abx and it is healing well. She has an appointment with endo next month but her sugars are running high.   Review of Systems As above    Objective:   Physical Exam BP 112/66  Pulse 79  Temp(Src) 98.4 F (36.9 C) (Oral)  Wt 177 lb (80.287 kg)  SpO2 97% General appearance: alert, cooperative, appears stated age and no distress Lungs: clear to auscultation bilaterally Heart: S1, S2 normal Abdomen: soft, non-tender; bowel sounds normal; no masses,  no organomegaly   Abscess on low abd-- healing well        Assessment & Plan:  1. Type II or unspecified type diabetes mellitus with neurological manifestations, not stated as uncontrolled Endo appointment pending - Insulin Glulisine (APIDRA SOLOSTAR) 100 UNIT/ML Solostar Pen; 10 u SQ BID  Dispense: 15 mL; Refill: 11  2. Abscess of skin of abdomen abx finished--  resolved

## 2014-06-16 NOTE — Progress Notes (Signed)
Pre visit review using our clinic review tool, if applicable. No additional management support is needed unless otherwise documented below in the visit note. 

## 2014-06-17 LAB — HEPATIC FUNCTION PANEL
ALBUMIN: 3.7 g/dL (ref 3.5–5.2)
ALK PHOS: 84 U/L (ref 39–117)
ALT: 20 U/L (ref 0–35)
AST: 21 U/L (ref 0–37)
Bilirubin, Direct: 0.1 mg/dL (ref 0.0–0.3)
Total Bilirubin: 0.7 mg/dL (ref 0.2–1.2)
Total Protein: 7.6 g/dL (ref 6.0–8.3)

## 2014-06-17 LAB — CBC WITH DIFFERENTIAL/PLATELET
BASOS ABS: 0 10*3/uL (ref 0.0–0.1)
BASOS PCT: 0.7 % (ref 0.0–3.0)
EOS PCT: 1.9 % (ref 0.0–5.0)
Eosinophils Absolute: 0.1 10*3/uL (ref 0.0–0.7)
HCT: 36.3 % (ref 36.0–46.0)
HEMOGLOBIN: 12.1 g/dL (ref 12.0–15.0)
LYMPHS PCT: 23.5 % (ref 12.0–46.0)
Lymphs Abs: 1.6 10*3/uL (ref 0.7–4.0)
MCHC: 33.2 g/dL (ref 30.0–36.0)
MCV: 83.7 fl (ref 78.0–100.0)
Monocytes Absolute: 0.5 10*3/uL (ref 0.1–1.0)
Monocytes Relative: 7.6 % (ref 3.0–12.0)
NEUTROS ABS: 4.4 10*3/uL (ref 1.4–7.7)
Neutrophils Relative %: 66.3 % (ref 43.0–77.0)
Platelets: 187 10*3/uL (ref 150.0–400.0)
RBC: 4.34 Mil/uL (ref 3.87–5.11)
RDW: 13.8 % (ref 11.5–15.5)
WBC: 6.7 10*3/uL (ref 4.0–10.5)

## 2014-06-17 LAB — BASIC METABOLIC PANEL
BUN: 18 mg/dL (ref 6–23)
CO2: 28 mEq/L (ref 19–32)
Calcium: 9.6 mg/dL (ref 8.4–10.5)
Chloride: 99 mEq/L (ref 96–112)
Creatinine, Ser: 1.3 mg/dL — ABNORMAL HIGH (ref 0.4–1.2)
GFR: 44.65 mL/min — AB (ref >60.00)
Glucose, Bld: 306 mg/dL — ABNORMAL HIGH (ref 70–99)
Potassium: 4.4 mEq/L (ref 3.5–5.1)
SODIUM: 135 meq/L (ref 135–145)

## 2014-06-22 ENCOUNTER — Ambulatory Visit: Payer: BC Managed Care – PPO | Admitting: Internal Medicine

## 2014-06-22 ENCOUNTER — Other Ambulatory Visit: Payer: Self-pay | Admitting: Family Medicine

## 2014-06-22 DIAGNOSIS — E1159 Type 2 diabetes mellitus with other circulatory complications: Secondary | ICD-10-CM

## 2014-06-24 ENCOUNTER — Telehealth: Payer: Self-pay | Admitting: *Deleted

## 2014-06-24 NOTE — Telephone Encounter (Signed)
Received Note from [facility] regarding Apidra Solostar Injection.  Insurance will only pay for product of Novo.  Faxed note to [facility] at [phone]) stating need pt's formular,and the Novo she was on was not controlling her per Dr. Minette Brine

## 2014-06-30 ENCOUNTER — Ambulatory Visit (INDEPENDENT_AMBULATORY_CARE_PROVIDER_SITE_OTHER): Payer: BC Managed Care – PPO | Admitting: Licensed Clinical Social Worker

## 2014-06-30 DIAGNOSIS — F4310 Post-traumatic stress disorder, unspecified: Secondary | ICD-10-CM

## 2014-07-07 ENCOUNTER — Ambulatory Visit (INDEPENDENT_AMBULATORY_CARE_PROVIDER_SITE_OTHER): Payer: BC Managed Care – PPO | Admitting: Licensed Clinical Social Worker

## 2014-07-07 DIAGNOSIS — F4310 Post-traumatic stress disorder, unspecified: Secondary | ICD-10-CM

## 2014-07-16 NOTE — Telephone Encounter (Signed)
Received prior authorization for Apidra Solostar Injection via fax from [employer].  Form filled out on [email or web address].  Awaiting approval.//AB/CMA

## 2014-07-21 ENCOUNTER — Ambulatory Visit (INDEPENDENT_AMBULATORY_CARE_PROVIDER_SITE_OTHER): Payer: BC Managed Care – PPO | Admitting: Licensed Clinical Social Worker

## 2014-07-21 DIAGNOSIS — F4310 Post-traumatic stress disorder, unspecified: Secondary | ICD-10-CM

## 2014-07-24 ENCOUNTER — Ambulatory Visit (HOSPITAL_BASED_OUTPATIENT_CLINIC_OR_DEPARTMENT_OTHER)
Admission: RE | Admit: 2014-07-24 | Discharge: 2014-07-24 | Disposition: A | Discharge status: Home / Self Care | Payer: BC Managed Care – PPO | Source: Ambulatory Visit | Attending: Family Medicine | Admitting: Family Medicine

## 2014-07-24 ENCOUNTER — Encounter: Payer: Self-pay | Admitting: Family Medicine

## 2014-07-24 ENCOUNTER — Ambulatory Visit (INDEPENDENT_AMBULATORY_CARE_PROVIDER_SITE_OTHER): Payer: BC Managed Care – PPO | Admitting: Family Medicine

## 2014-07-24 ENCOUNTER — Other Ambulatory Visit: Payer: Self-pay | Admitting: Family Medicine

## 2014-07-24 VITALS — BP 107/69 | HR 84 | Temp 98.6°F | Wt 179.8 lb

## 2014-07-24 DIAGNOSIS — R1031 Right lower quadrant pain: Secondary | ICD-10-CM | POA: Insufficient documentation

## 2014-07-24 DIAGNOSIS — G8929 Other chronic pain: Secondary | ICD-10-CM | POA: Diagnosis not present

## 2014-07-24 DIAGNOSIS — R319 Hematuria, unspecified: Secondary | ICD-10-CM

## 2014-07-24 DIAGNOSIS — K7460 Unspecified cirrhosis of liver: Secondary | ICD-10-CM

## 2014-07-24 LAB — BASIC METABOLIC PANEL
BUN: 19 mg/dL (ref 6–23)
CALCIUM: 9.9 mg/dL (ref 8.4–10.5)
CO2: 28 mEq/L (ref 19–32)
Chloride: 104 mEq/L (ref 96–112)
Creat: 1.41 mg/dL — ABNORMAL HIGH (ref 0.50–1.10)
GLUCOSE: 127 mg/dL — AB (ref 70–99)
POTASSIUM: 4.1 meq/L (ref 3.5–5.3)
SODIUM: 140 meq/L (ref 135–145)

## 2014-07-24 LAB — POCT URINALYSIS DIPSTICK
BILIRUBIN UA: NEGATIVE
Ketones, UA: NEGATIVE
NITRITE UA: NEGATIVE
PH UA: 6
Protein, UA: NEGATIVE
Spec Grav, UA: 1.02
Urobilinogen, UA: 2

## 2014-07-24 MED ORDER — IOHEXOL 300 MG/ML  SOLN
100.0000 mL | Freq: Once | INTRAMUSCULAR | Status: AC | PRN
  Administered 2014-07-24: 100 mL via INTRAVENOUS

## 2014-07-24 MED ORDER — CIPROFLOXACIN HCL 500 MG PO TABS: 500.0000 mg | ORAL_TABLET | Freq: Two times a day (BID) | ORAL | Status: AC

## 2014-07-24 NOTE — Patient Instructions (Signed)

## 2014-07-24 NOTE — Progress Notes (Signed)
   Subjective:    [name], female    DOB: 23-Jun-1956, 58 y.o.   MRN: [id number]  HPI Pt is here c/o low abd pain esp RLQ.  No NVD.  + fever for first day or 2 about 2 weeks ago.  None now.  She has also noticed blood in the urine.    Review of Systems As above     Objective:   Physical Exam BP 107/69  Pulse 84  Temp(Src) 98.6 F (37 C) (Oral)  Wt 179 lb 12.8 oz (81.557 kg)  SpO2 97% General appearance: alert, cooperative, appears stated age and no distress Nose: Nares normal. Septum midline. Mucosa normal. No drainage or sinus tenderness. Throat: lips, mucosa, and tongue normal; teeth and gums normal Neck: no adenopathy, no carotid bruit, no JVD, supple, symmetrical, trachea midline and thyroid not enlarged, symmetric, no tenderness/mass/nodules Lungs: clear to auscultation bilaterally Heart: regular rate and rhythm, S1, S2 normal, no murmur, click, rub or gallop Abdomen: abnormal findings:  moderate tenderness in the lower abdomen       Assessment & Plan:  1. Hematuria cipro for 5 days  - POCT Urinalysis Dipstick - Urine Culture - ciprofloxacin (CIPRO) 500 MG tablet; Take 1 tablet (500 mg total) by mouth 2 (two) times daily.  Dispense: 10 tablet; Refill: 0 - Basic Metabolic Panel (BMET)  2. Abdominal pain, chronic, right lower quadrant Check ct--r/o ap - CT Abdomen Pelvis W Contrast; Future - ciprofloxacin (CIPRO) 500 MG tablet; Take 1 tablet (500 mg total) by mouth 2 (two) times daily.  Dispense: 10 tablet; Refill: 0 - Basic Metabolic Panel (BMET) If pain wosens ----go to ER

## 2014-07-24 NOTE — Progress Notes (Signed)
Pre visit review using our clinic review tool, if applicable. No additional management support is needed unless otherwise documented below in the visit note. 

## 2014-07-25 LAB — URINE CULTURE: Colony Count: 3000

## 2014-07-27 ENCOUNTER — Encounter: Payer: Self-pay | Admitting: Internal Medicine

## 2014-07-30 ENCOUNTER — Ambulatory Visit (INDEPENDENT_AMBULATORY_CARE_PROVIDER_SITE_OTHER): Payer: BC Managed Care – PPO | Admitting: Licensed Clinical Social Worker

## 2014-07-30 DIAGNOSIS — F4310 Post-traumatic stress disorder, unspecified: Secondary | ICD-10-CM

## 2014-08-04 NOTE — Telephone Encounter (Signed)
Prior authorization for Apidra Solostar re-submitted on [email or web address]. Awaiting determination. JG//CMA

## 2014-08-06 ENCOUNTER — Telehealth: Payer: Self-pay | Admitting: Family Medicine

## 2014-08-06 NOTE — Telephone Encounter (Signed)
Received documentation from insurance company stating no prior authorization is required. [id number]

## 2014-08-06 NOTE — Telephone Encounter (Signed)
Caller name: Jendaya  # [phone]  Reason for call:  Pt has a Rn visit on Monday for flu and pneu injection; they are also wanting to get the whooping cough vaccine as well.  Will it be ok for patient to have this done?

## 2014-08-06 NOTE — Telephone Encounter (Signed)
Yep she is due for Tdap can come do at her discretion

## 2014-08-10 ENCOUNTER — Ambulatory Visit (INDEPENDENT_AMBULATORY_CARE_PROVIDER_SITE_OTHER): Payer: BC Managed Care – PPO

## 2014-08-10 DIAGNOSIS — Z23 Encounter for immunization: Secondary | ICD-10-CM

## 2014-08-13 ENCOUNTER — Ambulatory Visit (INDEPENDENT_AMBULATORY_CARE_PROVIDER_SITE_OTHER): Payer: BC Managed Care – PPO | Admitting: Licensed Clinical Social Worker

## 2014-08-13 DIAGNOSIS — F4310 Post-traumatic stress disorder, unspecified: Secondary | ICD-10-CM

## 2014-08-20 ENCOUNTER — Ambulatory Visit (INDEPENDENT_AMBULATORY_CARE_PROVIDER_SITE_OTHER): Payer: BC Managed Care – PPO | Admitting: Licensed Clinical Social Worker

## 2014-08-20 DIAGNOSIS — F4310 Post-traumatic stress disorder, unspecified: Secondary | ICD-10-CM

## 2014-08-25 ENCOUNTER — Ambulatory Visit: Payer: BC Managed Care – PPO | Admitting: Licensed Clinical Social Worker

## 2014-08-27 ENCOUNTER — Ambulatory Visit (INDEPENDENT_AMBULATORY_CARE_PROVIDER_SITE_OTHER): Payer: BC Managed Care – PPO | Admitting: Licensed Clinical Social Worker

## 2014-08-27 DIAGNOSIS — F4310 Post-traumatic stress disorder, unspecified: Secondary | ICD-10-CM

## 2014-09-10 ENCOUNTER — Ambulatory Visit (INDEPENDENT_AMBULATORY_CARE_PROVIDER_SITE_OTHER): Payer: BC Managed Care – PPO | Admitting: Licensed Clinical Social Worker

## 2014-09-10 DIAGNOSIS — F4310 Post-traumatic stress disorder, unspecified: Secondary | ICD-10-CM

## 2014-10-01 ENCOUNTER — Other Ambulatory Visit: Payer: Self-pay | Admitting: Nephrology

## 2014-10-01 ENCOUNTER — Ambulatory Visit: Payer: BC Managed Care – PPO | Admitting: Licensed Clinical Social Worker

## 2014-10-01 DIAGNOSIS — N183 Chronic kidney disease, stage 3 (moderate): Secondary | ICD-10-CM

## 2014-10-02 ENCOUNTER — Ambulatory Visit (INDEPENDENT_AMBULATORY_CARE_PROVIDER_SITE_OTHER): Payer: BC Managed Care – PPO | Admitting: Internal Medicine

## 2014-10-02 ENCOUNTER — Other Ambulatory Visit: Payer: BC Managed Care – PPO

## 2014-10-02 ENCOUNTER — Encounter: Payer: Self-pay | Admitting: Internal Medicine

## 2014-10-02 VITALS — BP 110/66 | HR 76 | Ht 62.0 in | Wt 175.4 lb

## 2014-10-02 DIAGNOSIS — R1314 Dysphagia, pharyngoesophageal phase: Secondary | ICD-10-CM

## 2014-10-02 DIAGNOSIS — K7460 Unspecified cirrhosis of liver: Secondary | ICD-10-CM

## 2014-10-02 DIAGNOSIS — K219 Gastro-esophageal reflux disease without esophagitis: Secondary | ICD-10-CM

## 2014-10-02 DIAGNOSIS — R932 Abnormal findings on diagnostic imaging of liver and biliary tract: Secondary | ICD-10-CM

## 2014-10-02 DIAGNOSIS — K625 Hemorrhage of anus and rectum: Secondary | ICD-10-CM

## 2014-10-02 MED ORDER — MOVIPREP 100 G PO SOLR: 1.0000 | Freq: Once | ORAL | Status: DC

## 2014-10-02 NOTE — Patient Instructions (Addendum)
You have been scheduled for an endoscopy and colonoscopy. Please follow the written instructions given to you at your visit today. Please pick up your prep at the pharmacy within the next 1-3 days. If you use inhalers (even only as needed), please bring them with you on the day of your procedure. Your physician has requested that you go to www.startemmi.com and enter the access code given to you at your visit today. This web site gives a general overview about your procedure. However, you should still follow specific instructions given to you by our office regarding your preparation for the procedure.  Your physician has requested that you go to the basement for the following lab work before leaving today: BMET----Requested by Radiology Department    You have been scheduled for an MRI at [facility] on 10-13-2014. Your appointment time is 8:00 am. Please arrive 15 minutes prior to your appointment time for registration purposes. Please make certain not to have anything to eat or drink 6 hours prior to your test. In addition, if you have any metal in your body, have a pacemaker or defibrillator, please be sure to let your ordering physician know. This test typically takes 45 minutes to 1 hour to complete. Should you need to reschedule your appointment, please contact radiology at [phone].
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
## 2014-10-05 ENCOUNTER — Ambulatory Visit
Admission: RE | Admit: 2014-10-05 | Discharge: 2014-10-05 | Disposition: A | Discharge status: Home / Self Care | Payer: BC Managed Care – PPO | Source: Ambulatory Visit | Attending: Nephrology | Admitting: Nephrology

## 2014-10-05 DIAGNOSIS — N183 Chronic kidney disease, stage 3 (moderate): Secondary | ICD-10-CM

## 2014-10-06 ENCOUNTER — Ambulatory Visit (INDEPENDENT_AMBULATORY_CARE_PROVIDER_SITE_OTHER): Payer: BC Managed Care – PPO | Admitting: Licensed Clinical Social Worker

## 2014-10-06 ENCOUNTER — Other Ambulatory Visit: Payer: Self-pay

## 2014-10-06 DIAGNOSIS — F4310 Post-traumatic stress disorder, unspecified: Secondary | ICD-10-CM

## 2014-10-06 MED ORDER — GABAPENTIN 300 MG PO CAPS: 600.0000 mg | ORAL_CAPSULE | Freq: Three times a day (TID) | ORAL | Status: DC

## 2014-10-13 ENCOUNTER — Ambulatory Visit (HOSPITAL_COMMUNITY): Admission: RE | Admit: 2014-10-13 | Payer: BC Managed Care – PPO | Source: Ambulatory Visit

## 2014-10-15 ENCOUNTER — Ambulatory Visit (INDEPENDENT_AMBULATORY_CARE_PROVIDER_SITE_OTHER): Payer: BC Managed Care – PPO | Admitting: Licensed Clinical Social Worker

## 2014-10-15 ENCOUNTER — Encounter: Payer: Self-pay | Admitting: Internal Medicine

## 2014-10-15 DIAGNOSIS — F4310 Post-traumatic stress disorder, unspecified: Secondary | ICD-10-CM

## 2014-10-20 ENCOUNTER — Telehealth: Payer: Self-pay | Admitting: Family Medicine

## 2014-10-20 MED ORDER — DULOXETINE HCL 60 MG PO CPEP: 60.0000 mg | ORAL_CAPSULE | Freq: Every day | ORAL | Status: DC

## 2014-10-20 NOTE — Telephone Encounter (Signed)
cymbalta 60 mg [facility]  [address]

## 2014-10-20 NOTE — Telephone Encounter (Signed)
Rx faxed.    KP 

## 2014-10-21 ENCOUNTER — Ambulatory Visit (HOSPITAL_COMMUNITY): Admission: RE | Admit: 2014-10-21 | Payer: BC Managed Care – PPO | Source: Ambulatory Visit

## 2014-10-27 ENCOUNTER — Ambulatory Visit (INDEPENDENT_AMBULATORY_CARE_PROVIDER_SITE_OTHER): Payer: BC Managed Care – PPO | Admitting: Medical

## 2014-10-27 ENCOUNTER — Telehealth: Payer: Self-pay | Admitting: Medical

## 2014-10-27 ENCOUNTER — Encounter: Payer: Self-pay | Admitting: Medical

## 2014-10-27 VITALS — BP 135/82 | HR 79 | Temp 97.9°F | Ht 62.0 in | Wt 178.4 lb

## 2014-10-27 DIAGNOSIS — M2550 Pain in unspecified joint: Secondary | ICD-10-CM | POA: Insufficient documentation

## 2014-10-27 DIAGNOSIS — J0100 Acute maxillary sinusitis, unspecified: Secondary | ICD-10-CM

## 2014-10-27 LAB — C-REACTIVE PROTEIN: CRP: <0.5 mg/dL (ref 0.5–20.0)

## 2014-10-27 LAB — RHEUMATOID FACTOR

## 2014-10-27 LAB — SEDIMENTATION RATE: Sed Rate: 56 mm/hr — ABNORMAL HIGH (ref 0–22)

## 2014-10-27 MED ORDER — FLUTICASONE PROPIONATE 50 MCG/ACT NA SUSP: 2.0000 | Freq: Every day | NASAL | Status: DC

## 2014-10-27 MED ORDER — AZITHROMYCIN 250 MG PO TABS
ORAL_TABLET | ORAL | Status: DC
Start: 2014-10-27 — End: 2014-11-03

## 2014-10-27 MED ORDER — BENZONATATE 100 MG PO CAPS: 100.0000 mg | ORAL_CAPSULE | Freq: Three times a day (TID) | ORAL | Status: DC | PRN

## 2014-10-27 NOTE — Assessment & Plan Note (Signed)
Arthitis panel today. Will follow and notify pt.

## 2014-10-27 NOTE — Telephone Encounter (Signed)
Referral to rheumatologist.

## 2014-10-27 NOTE — Progress Notes (Signed)
Pre visit review using our clinic review tool, if applicable. No additional management support is needed unless otherwise documented below in the visit note. 

## 2014-10-27 NOTE — Assessment & Plan Note (Signed)
Your appear to have a sinus infection with some bronchitis. I am prescribing azithromycin antibiotic for the infection. To help with the nasal congestion I prescribed nasal steroid flonase. For your associated cough, I prescribed cough medicine benzonatate
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
## 2014-10-27 NOTE — Patient Instructions (Addendum)
  Your appear to have a sinus infection with some bronchitis. I am prescribing azithromycin antibiotic for the infection. To help with the nasal congestion I prescribed nasal steroid flonase. For your associated cough, I prescribed cough medicine benzonatate   Rest, hydrate, tylenol for fever.  Follow up in 7 days or as needed.

## 2014-10-28 LAB — ANA: ANA: NEGATIVE

## 2014-10-28 NOTE — Telephone Encounter (Signed)
Patient notified of lab results and referral.

## 2014-11-03 ENCOUNTER — Ambulatory Visit (INDEPENDENT_AMBULATORY_CARE_PROVIDER_SITE_OTHER): Payer: BC Managed Care – PPO | Admitting: Family Medicine

## 2014-11-03 ENCOUNTER — Encounter: Payer: Self-pay | Admitting: Family Medicine

## 2014-11-03 ENCOUNTER — Ambulatory Visit (INDEPENDENT_AMBULATORY_CARE_PROVIDER_SITE_OTHER): Payer: BLUE CROSS/BLUE SHIELD | Admitting: Licensed Clinical Social Worker

## 2014-11-03 VITALS — BP 142/84 | HR 84 | Temp 97.9°F | Wt 180.0 lb

## 2014-11-03 DIAGNOSIS — F4310 Post-traumatic stress disorder, unspecified: Secondary | ICD-10-CM

## 2014-11-03 DIAGNOSIS — J0110 Acute frontal sinusitis, unspecified: Secondary | ICD-10-CM

## 2014-11-03 DIAGNOSIS — R05 Cough: Secondary | ICD-10-CM

## 2014-11-03 DIAGNOSIS — R059 Cough, unspecified: Secondary | ICD-10-CM

## 2014-11-03 MED ORDER — CLARITHROMYCIN ER 500 MG PO TB24: 1000.0000 mg | ORAL_TABLET | Freq: Every day | ORAL | Status: DC

## 2014-11-03 MED ORDER — METHYLPREDNISOLONE ACETATE 80 MG/ML IJ SUSP
80.0000 mg | Freq: Once | INTRAMUSCULAR | Status: AC
  Administered 2014-11-03: 80 mg via INTRAMUSCULAR

## 2014-11-03 MED ORDER — PREDNISONE 10 MG PO TABS: ORAL_TABLET | ORAL | Status: DC

## 2014-11-03 NOTE — Progress Notes (Signed)
Pre visit review using our clinic review tool, if applicable. No additional management support is needed unless otherwise documented below in the visit note. 

## 2014-11-03 NOTE — Patient Instructions (Signed)

## 2014-11-04 NOTE — Progress Notes (Signed)
  Subjective:     [name] [name] is a 59 y.o. female who presents for evaluation of sinus pain. Symptoms include: congestion, facial pain, headaches, nasal congestion, post nasal drip and sinus pressure. Onset of symptoms was several weeks ago. Symptoms have been gradually worsening since that time. Past history is significant for no history of pneumonia or bronchitis. Patient is a non-smoker.  The following portions of the patient's history were reviewed and updated as appropriate: allergies, current medications, past family history, past medical history, past social history, past surgical history and problem list.  Review of Systems Pertinent items are noted in HPI.   Objective:    BP 142/84 mmHg  Pulse 84  Temp(Src) 97.9 F (36.6 C) (Oral)  Wt 180 lb (81.647 kg)  SpO2 99% General appearance: alert, cooperative, appears stated age and no distress Ears: normal TM's and external ear canals both ears Nose: green discharge, moderate congestion, turbinates red, swollen, sinus tenderness bilateral Throat: abnormal findings: marked oropharyngeal erythema Neck: moderate anterior cervical adenopathy, supple, symmetrical, trachea midline and thyroid not enlarged, symmetric, no tenderness/mass/nodules Lungs: clear to auscultation bilaterally Heart: S1, S2 normal    Assessment:    Acute bacterial sinusitis.    Plan:    Nasal steroids per medication orders. Antihistamines per medication orders. Biaxin per medication orders.

## 2014-11-05 ENCOUNTER — Encounter: Payer: BC Managed Care – PPO | Admitting: Internal Medicine

## 2014-11-10 ENCOUNTER — Ambulatory Visit (INDEPENDENT_AMBULATORY_CARE_PROVIDER_SITE_OTHER): Payer: BLUE CROSS/BLUE SHIELD | Admitting: Licensed Clinical Social Worker

## 2014-11-10 DIAGNOSIS — F4310 Post-traumatic stress disorder, unspecified: Secondary | ICD-10-CM

## 2014-11-11 ENCOUNTER — Ambulatory Visit (HOSPITAL_COMMUNITY): Admission: RE | Admit: 2014-11-11 | Payer: BLUE CROSS/BLUE SHIELD | Source: Ambulatory Visit

## 2014-11-23 ENCOUNTER — Telehealth: Payer: Self-pay | Admitting: Internal Medicine

## 2014-11-24 ENCOUNTER — Encounter: Payer: Self-pay | Admitting: Internal Medicine

## 2014-11-25 ENCOUNTER — Other Ambulatory Visit (HOSPITAL_COMMUNITY): Payer: BLUE CROSS/BLUE SHIELD

## 2014-12-10 ENCOUNTER — Ambulatory Visit (INDEPENDENT_AMBULATORY_CARE_PROVIDER_SITE_OTHER): Payer: BLUE CROSS/BLUE SHIELD | Admitting: Medical

## 2014-12-10 ENCOUNTER — Encounter: Payer: Self-pay | Admitting: Medical

## 2014-12-10 ENCOUNTER — Ambulatory Visit (HOSPITAL_BASED_OUTPATIENT_CLINIC_OR_DEPARTMENT_OTHER)
Admission: RE | Admit: 2014-12-10 | Discharge: 2014-12-10 | Disposition: A | Discharge status: Home / Self Care | Payer: BLUE CROSS/BLUE SHIELD | Source: Ambulatory Visit | Attending: Medical | Admitting: Medical

## 2014-12-10 VITALS — BP 115/80 | HR 100 | Temp 99.0°F | Ht 62.0 in | Wt 172.4 lb

## 2014-12-10 DIAGNOSIS — J0100 Acute maxillary sinusitis, unspecified: Secondary | ICD-10-CM

## 2014-12-10 DIAGNOSIS — R059 Cough, unspecified: Secondary | ICD-10-CM

## 2014-12-10 DIAGNOSIS — R05 Cough: Secondary | ICD-10-CM

## 2014-12-10 DIAGNOSIS — R5383 Other fatigue: Secondary | ICD-10-CM | POA: Insufficient documentation

## 2014-12-10 DIAGNOSIS — R0989 Other specified symptoms and signs involving the circulatory and respiratory systems: Secondary | ICD-10-CM | POA: Insufficient documentation

## 2014-12-10 MED ORDER — BENZONATATE 100 MG PO CAPS: 100.0000 mg | ORAL_CAPSULE | Freq: Three times a day (TID) | ORAL | Status: DC | PRN

## 2014-12-10 MED ORDER — CLARITHROMYCIN ER 500 MG PO TB24: 1000.0000 mg | ORAL_TABLET | Freq: Every day | ORAL | Status: DC

## 2014-12-10 NOTE — Assessment & Plan Note (Signed)
Your appear to have a sinus infection. I am prescribing antibiotic biaxin xl for the infection. To help with the nasal congestion use your nasal steroid. For your associated cough, I prescribed cough medicine benzonatate  Rest, hydrate, tylenol for fever.  Follow up in 7 days or as needed.

## 2014-12-10 NOTE — Assessment & Plan Note (Signed)
Cbc today
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
## 2014-12-10 NOTE — Assessment & Plan Note (Signed)
cxr today 

## 2014-12-10 NOTE — Patient Instructions (Addendum)
Sinusitis, acute maxillary Your appear to have a sinus infection. I am prescribing antibiotic biaxin xl for the infection. To help with the nasal congestion use your nasal steroid. For your associated cough, I prescribed cough medicine benzonatate  Rest, hydrate, tylenol for fever.  Follow up in 7 days or as needed.   Cough cxr today.   Fatigue Cbc today   For fatigue and mild low bp will get cbc.

## 2014-12-10 NOTE — Progress Notes (Signed)
Pre visit review using our clinic review tool, if applicable. No additional management support is needed unless otherwise documented below in the visit note. 

## 2014-12-11 ENCOUNTER — Other Ambulatory Visit: Payer: BLUE CROSS/BLUE SHIELD

## 2014-12-11 DIAGNOSIS — T847XXA Infection and inflammatory reaction due to other internal orthopedic prosthetic devices, implants and grafts, initial encounter: Secondary | ICD-10-CM | POA: Insufficient documentation

## 2014-12-28 ENCOUNTER — Other Ambulatory Visit: Payer: Self-pay

## 2014-12-28 MED ORDER — INSULIN GLARGINE 100 UNIT/ML SOLOSTAR PEN: PEN_INJECTOR | SUBCUTANEOUS | Status: DC

## 2015-01-01 ENCOUNTER — Telehealth: Payer: Self-pay | Admitting: Family Medicine

## 2015-01-01 NOTE — Telephone Encounter (Signed)
noted 

## 2015-01-01 NOTE — Telephone Encounter (Signed)
Caller name: Loma Sousa  Relation to pt: RN from [facility] back number: [phone]   Reason for call:  RN from [place] wanted to inform MD that pt has enrolled in case management.

## 2015-01-01 NOTE — Telephone Encounter (Signed)
FYI for the MD.      KP

## 2015-01-06 ENCOUNTER — Other Ambulatory Visit: Payer: Self-pay

## 2015-01-06 MED ORDER — LISINOPRIL 5 MG PO TABS: 5.0000 mg | ORAL_TABLET | Freq: Every day | ORAL | Status: DC

## 2015-01-12 DIAGNOSIS — E1142 Type 2 diabetes mellitus with diabetic polyneuropathy: Secondary | ICD-10-CM | POA: Insufficient documentation

## 2015-01-12 NOTE — Telephone Encounter (Signed)
Pt NOT Billed ECL Cx Fee

## 2015-02-01 ENCOUNTER — Other Ambulatory Visit: Payer: Self-pay | Admitting: Family Medicine

## 2015-02-27 ENCOUNTER — Encounter (HOSPITAL_BASED_OUTPATIENT_CLINIC_OR_DEPARTMENT_OTHER): Payer: Self-pay | Admitting: Emergency Medicine

## 2015-02-27 ENCOUNTER — Emergency Department (HOSPITAL_BASED_OUTPATIENT_CLINIC_OR_DEPARTMENT_OTHER)
Admission: EM | Admit: 2015-02-27 | Discharge: 2015-02-27 | Disposition: A | Discharge status: Home / Self Care | Payer: BLUE CROSS/BLUE SHIELD | Attending: Emergency Medicine | Admitting: Emergency Medicine

## 2015-02-27 DIAGNOSIS — Z862 Personal history of diseases of the blood and blood-forming organs and certain disorders involving the immune mechanism: Secondary | ICD-10-CM | POA: Diagnosis not present

## 2015-02-27 DIAGNOSIS — N189 Chronic kidney disease, unspecified: Secondary | ICD-10-CM | POA: Insufficient documentation

## 2015-02-27 DIAGNOSIS — E119 Type 2 diabetes mellitus without complications: Secondary | ICD-10-CM | POA: Insufficient documentation

## 2015-02-27 DIAGNOSIS — F419 Anxiety disorder, unspecified: Secondary | ICD-10-CM | POA: Insufficient documentation

## 2015-02-27 DIAGNOSIS — Z794 Long term (current) use of insulin: Secondary | ICD-10-CM | POA: Diagnosis not present

## 2015-02-27 DIAGNOSIS — Z7951 Long term (current) use of inhaled steroids: Secondary | ICD-10-CM | POA: Diagnosis not present

## 2015-02-27 DIAGNOSIS — M79604 Pain in right leg: Secondary | ICD-10-CM | POA: Insufficient documentation

## 2015-02-27 DIAGNOSIS — E780 Pure hypercholesterolemia: Secondary | ICD-10-CM | POA: Diagnosis not present

## 2015-02-27 DIAGNOSIS — Z79899 Other long term (current) drug therapy: Secondary | ICD-10-CM | POA: Diagnosis not present

## 2015-02-27 DIAGNOSIS — M797 Fibromyalgia: Secondary | ICD-10-CM | POA: Insufficient documentation

## 2015-02-27 DIAGNOSIS — Z8781 Personal history of (healed) traumatic fracture: Secondary | ICD-10-CM | POA: Diagnosis not present

## 2015-02-27 DIAGNOSIS — Z8719 Personal history of other diseases of the digestive system: Secondary | ICD-10-CM | POA: Insufficient documentation

## 2015-02-27 DIAGNOSIS — Z792 Long term (current) use of antibiotics: Secondary | ICD-10-CM | POA: Insufficient documentation

## 2015-02-27 DIAGNOSIS — I252 Old myocardial infarction: Secondary | ICD-10-CM | POA: Insufficient documentation

## 2015-02-27 DIAGNOSIS — M1990 Unspecified osteoarthritis, unspecified site: Secondary | ICD-10-CM | POA: Diagnosis not present

## 2015-02-27 DIAGNOSIS — Z880 Allergy status to penicillin: Secondary | ICD-10-CM | POA: Diagnosis not present

## 2015-02-27 DIAGNOSIS — I129 Hypertensive chronic kidney disease with stage 1 through stage 4 chronic kidney disease, or unspecified chronic kidney disease: Secondary | ICD-10-CM | POA: Diagnosis not present

## 2015-02-27 DIAGNOSIS — M79605 Pain in left leg: Secondary | ICD-10-CM | POA: Diagnosis present

## 2015-02-27 MED ORDER — HYDROMORPHONE HCL 1 MG/ML IJ SOLN
2.0000 mg | Freq: Once | INTRAMUSCULAR | Status: AC
  Administered 2015-02-27: 2 mg via INTRAMUSCULAR
  Filled 2015-02-27: qty 2

## 2015-02-27 MED ORDER — OXYCODONE-ACETAMINOPHEN 5-325 MG PO TABS: 1.0000 | ORAL_TABLET | Freq: Four times a day (QID) | ORAL | Status: DC | PRN

## 2015-02-27 NOTE — Discharge Instructions (Signed)
Percocet as prescribed as needed for pain.  Follow-up with your primary doctor for future refills of pain medications.   Fibromyalgia Fibromyalgia is a disorder that is often misunderstood. It is associated with muscular pains and tenderness that comes and goes. It is often associated with fatigue and sleep disturbances. Though it tends to be long-lasting, fibromyalgia is not life-threatening. CAUSES  The exact cause of fibromyalgia is unknown. People with certain gene types are predisposed to developing fibromyalgia and other conditions. Certain factors can play a role as triggers, such as:  Spine disorders.  Arthritis.  Severe injury (trauma) and other physical stressors.  Emotional stressors. SYMPTOMS   The main symptom is pain and stiffness in the muscles and joints, which can vary over time.  Sleep and fatigue problems. Other related symptoms may include:  Bowel and bladder problems.  Headaches.  Visual problems.  Problems with odors and noises.  Depression or mood changes.  Painful periods (dysmenorrhea).  Dryness of the skin or eyes. DIAGNOSIS  There are no specific tests for diagnosing fibromyalgia. Patients can be diagnosed accurately from the specific symptoms they have. The diagnosis is made by determining that nothing else is causing the problems. TREATMENT  There is no cure. Management includes medicines and an active, healthy lifestyle. The goal is to enhance physical fitness, decrease pain, and improve sleep. HOME CARE INSTRUCTIONS   Only take over-the-counter or prescription medicines as directed by your caregiver. Sleeping pills, tranquilizers, and pain medicines may make your problems worse.  Low-impact aerobic exercise is very important and advised for treatment. At first, it may seem to make pain worse. Gradually increasing your tolerance will overcome this feeling.  Learning relaxation techniques and how to control stress will help you. Biofeedback,  visual imagery, hypnosis, muscle relaxation, yoga, and meditation are all options.  Anti-inflammatory medicines and physical therapy may provide short-term help.  Acupuncture or massage treatments may help.  Take muscle relaxant medicines as suggested by your caregiver.  Avoid stressful situations.  Plan a healthy lifestyle. This includes your diet, sleep, rest, exercise, and friends.  Find and practice a hobby you enjoy.  Join a fibromyalgia support group for interaction, ideas, and sharing advice. This may be helpful. SEEK MEDICAL CARE IF:  You are not having good results or improvement from your treatment. FOR MORE INFORMATION  National Fibromyalgia Association: www.fmaware.[place]: www.arthritis.org Document Released: 10/16/2005 Document Revised: 01/08/2012 Document Reviewed: 01/26/2010 [facility] Patient Information 2015 [place], [place]. This information is not intended to replace advice given to you by your health care provider. Make sure you discuss any questions you have with your health care provider.

## 2015-02-27 NOTE — ED Notes (Signed)
Patient states that she has fibromyalgia and diabetes and she has had muscle aches and pain to her bilateral legs and radiating up into her back. The patient reports that she has run out of her medications for pain
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
## 2015-03-02 ENCOUNTER — Ambulatory Visit: Payer: BLUE CROSS/BLUE SHIELD | Attending: Orthopaedic Surgery | Admitting: Physical Therapy

## 2015-03-02 DIAGNOSIS — M25621 Stiffness of right elbow, not elsewhere classified: Secondary | ICD-10-CM | POA: Diagnosis not present

## 2015-03-02 DIAGNOSIS — M79601 Pain in right arm: Secondary | ICD-10-CM | POA: Insufficient documentation
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
## 2015-03-02 NOTE — Patient Instructions (Signed)
Grip Strengthening (Isometric)   Squeeze washcloth roll. Hold _5-10___ seconds. Relax _3-5___ seconds. Repeat _15___ times. Do __5__ sessions per day.  Copyright  VHI. All rights reserved.   Flexion / Extension   Hands clasped, bend wrist by moving hands to left and right. Repeat __15__ times. Do _5___ sessions per day.  Copyright  VHI. All rights reserved.   SHOULDER: Flexion Unilateral   Raise one arm overhead. Keep thumb pointed up. _15__ reps per set, _5__ sets per day. Copyright  VHI. All rights reserved.   SHOULDER: Abduction Unilateral   Raise arm out and up palm up. Keep elbow straight. Do not shrug shoulders. _15__ reps per set, _5__ sets per day.  Copyright  VHI. All rights reserved.   AROM: Elbow Flexion / Extension   With right hand palm up, gently bend elbow as far as possible. Then straighten arm as far as possible. Repeat __15__ times per set. Do __1__ sets per session. Do _5___ sessions per day.  STAY IN BRACE FOR THIS EXERCISE!!  Copyright  VHI. All rights reserved.   Laureen Abrahams, PT, DPT 03/02/2015 4:46 PM  [facility] [place], [place] [address]  [phone] (office) [phone] (fax)

## 2015-03-09 ENCOUNTER — Ambulatory Visit: Payer: BLUE CROSS/BLUE SHIELD | Admitting: Rehabilitation

## 2015-03-09 DIAGNOSIS — M79601 Pain in right arm: Secondary | ICD-10-CM | POA: Diagnosis not present

## 2015-03-09 DIAGNOSIS — M25621 Stiffness of right elbow, not elsewhere classified: Secondary | ICD-10-CM
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
## 2015-03-11 ENCOUNTER — Ambulatory Visit: Payer: BLUE CROSS/BLUE SHIELD

## 2015-03-18 ENCOUNTER — Ambulatory Visit: Payer: BLUE CROSS/BLUE SHIELD | Admitting: Rehabilitation

## 2015-03-18 DIAGNOSIS — M79601 Pain in right arm: Secondary | ICD-10-CM

## 2015-03-18 DIAGNOSIS — M25621 Stiffness of right elbow, not elsewhere classified: Secondary | ICD-10-CM
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
## 2015-03-23 ENCOUNTER — Ambulatory Visit: Payer: BLUE CROSS/BLUE SHIELD | Admitting: Physical Therapy

## 2015-03-23 DIAGNOSIS — M79601 Pain in right arm: Secondary | ICD-10-CM

## 2015-03-23 DIAGNOSIS — M25621 Stiffness of right elbow, not elsewhere classified: Secondary | ICD-10-CM
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
## 2015-03-25 ENCOUNTER — Ambulatory Visit (HOSPITAL_BASED_OUTPATIENT_CLINIC_OR_DEPARTMENT_OTHER)
Admission: RE | Admit: 2015-03-25 | Discharge: 2015-03-25 | Disposition: A | Discharge status: Home / Self Care | Payer: BLUE CROSS/BLUE SHIELD | Source: Ambulatory Visit | Attending: Medical | Admitting: Medical

## 2015-03-25 ENCOUNTER — Ambulatory Visit: Payer: BLUE CROSS/BLUE SHIELD | Admitting: Rehabilitation

## 2015-03-25 ENCOUNTER — Other Ambulatory Visit (HOSPITAL_BASED_OUTPATIENT_CLINIC_OR_DEPARTMENT_OTHER): Payer: BLUE CROSS/BLUE SHIELD

## 2015-03-25 ENCOUNTER — Ambulatory Visit (INDEPENDENT_AMBULATORY_CARE_PROVIDER_SITE_OTHER): Payer: BLUE CROSS/BLUE SHIELD | Admitting: Medical

## 2015-03-25 VITALS — BP 128/85 | HR 93 | Temp 98.1°F | Wt 178.0 lb

## 2015-03-25 DIAGNOSIS — Z9049 Acquired absence of other specified parts of digestive tract: Secondary | ICD-10-CM | POA: Insufficient documentation

## 2015-03-25 DIAGNOSIS — R5383 Other fatigue: Secondary | ICD-10-CM

## 2015-03-25 DIAGNOSIS — K7460 Unspecified cirrhosis of liver: Secondary | ICD-10-CM | POA: Insufficient documentation

## 2015-03-25 DIAGNOSIS — M25521 Pain in right elbow: Secondary | ICD-10-CM

## 2015-03-25 DIAGNOSIS — R112 Nausea with vomiting, unspecified: Secondary | ICD-10-CM | POA: Diagnosis not present

## 2015-03-25 DIAGNOSIS — E1065 Type 1 diabetes mellitus with hyperglycemia: Secondary | ICD-10-CM

## 2015-03-25 DIAGNOSIS — R932 Abnormal findings on diagnostic imaging of liver and biliary tract: Secondary | ICD-10-CM | POA: Insufficient documentation

## 2015-03-25 DIAGNOSIS — R109 Unspecified abdominal pain: Secondary | ICD-10-CM | POA: Insufficient documentation

## 2015-03-25 DIAGNOSIS — R1011 Right upper quadrant pain: Secondary | ICD-10-CM | POA: Diagnosis not present

## 2015-03-25 DIAGNOSIS — IMO0002 Reserved for concepts with insufficient information to code with codable children: Secondary | ICD-10-CM

## 2015-03-25 DIAGNOSIS — E1069 Type 1 diabetes mellitus with other specified complication: Secondary | ICD-10-CM

## 2015-03-25 DIAGNOSIS — E108 Type 1 diabetes mellitus with unspecified complications: Secondary | ICD-10-CM

## 2015-03-25 LAB — COMPREHENSIVE METABOLIC PANEL
ALBUMIN: 4 g/dL (ref 3.5–5.2)
ALT: 18 U/L (ref 0–35)
AST: 22 U/L (ref 0–37)
Alkaline Phosphatase: 101 U/L (ref 39–117)
BUN: 22 mg/dL (ref 6–23)
CALCIUM: 10.2 mg/dL (ref 8.4–10.5)
CHLORIDE: 101 meq/L (ref 96–112)
CO2: 27 mEq/L (ref 19–32)
Creat: 1.32 mg/dL — ABNORMAL HIGH (ref 0.50–1.10)
Glucose, Bld: 177 mg/dL — ABNORMAL HIGH (ref 70–99)
POTASSIUM: 4.5 meq/L (ref 3.5–5.3)
SODIUM: 139 meq/L (ref 135–145)
TOTAL PROTEIN: 7.7 g/dL (ref 6.0–8.3)
Total Bilirubin: 0.4 mg/dL (ref 0.2–1.2)

## 2015-03-25 LAB — AMYLASE: Amylase: 42 U/L (ref 0–105)

## 2015-03-25 LAB — CBC WITH DIFFERENTIAL/PLATELET
BASOS ABS: 0 10*3/uL (ref 0.0–0.1)
Basophils Relative: 0 % (ref 0–1)
Eosinophils Absolute: 0.2 10*3/uL (ref 0.0–0.7)
Eosinophils Relative: 2 % (ref 0–5)
HCT: 47.7 % — ABNORMAL HIGH (ref 36.0–46.0)
HEMOGLOBIN: 15.8 g/dL — AB (ref 12.0–15.0)
LYMPHS PCT: 26 % (ref 12–46)
Lymphs Abs: 2.5 10*3/uL (ref 0.7–4.0)
MCH: 26.3 pg (ref 26.0–34.0)
MCHC: 33.1 g/dL (ref 30.0–36.0)
MCV: 79.5 fL (ref 78.0–100.0)
MONOS PCT: 9 % (ref 3–12)
MPV: 10.3 fL (ref 8.6–12.4)
Monocytes Absolute: 0.9 10*3/uL (ref 0.1–1.0)
NEUTROS ABS: 6 10*3/uL (ref 1.7–7.7)
NEUTROS PCT: 63 % (ref 43–77)
Platelets: 268 10*3/uL (ref 150–400)
RBC: 6 MIL/uL — AB (ref 3.87–5.11)
RDW: 15.7 % — ABNORMAL HIGH (ref 11.5–15.5)
WBC: 9.5 10*3/uL (ref 4.0–10.5)

## 2015-03-25 LAB — AMMONIA: AMMONIA: 31 umol/L (ref 16–53)

## 2015-03-25 MED ORDER — RANITIDINE HCL 150 MG PO CAPS: 150.0000 mg | ORAL_CAPSULE | Freq: Two times a day (BID) | ORAL | Status: DC

## 2015-03-25 MED ORDER — TRAMADOL HCL 50 MG PO TABS: 50.0000 mg | ORAL_TABLET | Freq: Four times a day (QID) | ORAL | Status: DC | PRN

## 2015-03-25 MED ORDER — ONDANSETRON 8 MG PO TBDP: 8.0000 mg | ORAL_TABLET | Freq: Three times a day (TID) | ORAL | Status: DC | PRN

## 2015-03-25 NOTE — Addendum Note (Signed)
Addended by: Modena Morrow D on: 03/25/2015 04:38 PM   Modules accepted: Orders

## 2015-03-25 NOTE — Assessment & Plan Note (Signed)
Get a1-c today

## 2015-03-25 NOTE — Progress Notes (Signed)
Pre visit review using our clinic review tool, if applicable. No additional management support is needed unless otherwise documented below in the visit note. 

## 2015-03-25 NOTE — Addendum Note (Signed)
Addended by: Modena Morrow D on: 03/25/2015 04:14 PM   Modules accepted: Orders

## 2015-03-25 NOTE — Patient Instructions (Addendum)
Cirrhosis of liver without mention of alcohol Hx of and with abdomen pain will get stat cmp today. And US abdomen   Pain in the abdomen Stat abd [name]. Cbc, amylase, lipase today. Rx zantac today.    Type 1 diabetes mellitus with neurological manifestations Get a1-c today    Rt arm pain. Elbow xray.  Rx zantac, and zofran.  Will wait on labs, [name] abd, and xray before determining use of antibiotic.  Follow up Tuesday or as needed. ED over weekend or tonight  if signs symptoms worsen or change. Will call and go over labs when they are in

## 2015-03-25 NOTE — Assessment & Plan Note (Signed)
Hx of and with abdomen pain will get stat cmp today. And US abdomen
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
## 2015-03-25 NOTE — Addendum Note (Signed)
Addended by: Modena Morrow D on: 03/25/2015 04:20 PM   Modules accepted: Orders

## 2015-03-25 NOTE — Assessment & Plan Note (Signed)
Stat abd [name]. Cbc, amylase, lipase today. Rx zantac today.

## 2015-03-25 NOTE — Addendum Note (Signed)
Addended by: Modena Morrow D on: 03/25/2015 04:18 PM   Modules accepted: Orders

## 2015-03-26 ENCOUNTER — Encounter (HOSPITAL_BASED_OUTPATIENT_CLINIC_OR_DEPARTMENT_OTHER): Payer: Self-pay | Admitting: *Deleted

## 2015-03-26 ENCOUNTER — Emergency Department (HOSPITAL_BASED_OUTPATIENT_CLINIC_OR_DEPARTMENT_OTHER): Payer: BLUE CROSS/BLUE SHIELD

## 2015-03-26 ENCOUNTER — Telehealth: Payer: Self-pay | Admitting: Family Medicine

## 2015-03-26 ENCOUNTER — Emergency Department (HOSPITAL_BASED_OUTPATIENT_CLINIC_OR_DEPARTMENT_OTHER)
Admission: EM | Admit: 2015-03-26 | Discharge: 2015-03-26 | Disposition: A | Discharge status: Home / Self Care | Payer: BLUE CROSS/BLUE SHIELD | Attending: Emergency Medicine | Admitting: Emergency Medicine

## 2015-03-26 ENCOUNTER — Other Ambulatory Visit: Payer: Self-pay

## 2015-03-26 ENCOUNTER — Telehealth: Payer: Self-pay | Admitting: Medical

## 2015-03-26 DIAGNOSIS — M25521 Pain in right elbow: Secondary | ICD-10-CM | POA: Diagnosis not present

## 2015-03-26 DIAGNOSIS — R51 Headache: Secondary | ICD-10-CM | POA: Insufficient documentation

## 2015-03-26 DIAGNOSIS — Z880 Allergy status to penicillin: Secondary | ICD-10-CM | POA: Insufficient documentation

## 2015-03-26 DIAGNOSIS — N189 Chronic kidney disease, unspecified: Secondary | ICD-10-CM | POA: Insufficient documentation

## 2015-03-26 DIAGNOSIS — M159 Polyosteoarthritis, unspecified: Secondary | ICD-10-CM | POA: Insufficient documentation

## 2015-03-26 DIAGNOSIS — R55 Syncope and collapse: Secondary | ICD-10-CM | POA: Insufficient documentation

## 2015-03-26 DIAGNOSIS — F419 Anxiety disorder, unspecified: Secondary | ICD-10-CM | POA: Insufficient documentation

## 2015-03-26 DIAGNOSIS — Z79899 Other long term (current) drug therapy: Secondary | ICD-10-CM | POA: Diagnosis not present

## 2015-03-26 DIAGNOSIS — I129 Hypertensive chronic kidney disease with stage 1 through stage 4 chronic kidney disease, or unspecified chronic kidney disease: Secondary | ICD-10-CM | POA: Diagnosis not present

## 2015-03-26 DIAGNOSIS — Z8719 Personal history of other diseases of the digestive system: Secondary | ICD-10-CM | POA: Diagnosis not present

## 2015-03-26 DIAGNOSIS — Z90710 Acquired absence of both cervix and uterus: Secondary | ICD-10-CM | POA: Insufficient documentation

## 2015-03-26 DIAGNOSIS — Z940 Kidney transplant status: Secondary | ICD-10-CM | POA: Insufficient documentation

## 2015-03-26 DIAGNOSIS — M797 Fibromyalgia: Secondary | ICD-10-CM | POA: Insufficient documentation

## 2015-03-26 DIAGNOSIS — I252 Old myocardial infarction: Secondary | ICD-10-CM | POA: Diagnosis not present

## 2015-03-26 DIAGNOSIS — Z9079 Acquired absence of other genital organ(s): Secondary | ICD-10-CM | POA: Insufficient documentation

## 2015-03-26 DIAGNOSIS — Z9089 Acquired absence of other organs: Secondary | ICD-10-CM | POA: Insufficient documentation

## 2015-03-26 DIAGNOSIS — Z7951 Long term (current) use of inhaled steroids: Secondary | ICD-10-CM | POA: Insufficient documentation

## 2015-03-26 DIAGNOSIS — E119 Type 2 diabetes mellitus without complications: Secondary | ICD-10-CM | POA: Diagnosis not present

## 2015-03-26 DIAGNOSIS — Z862 Personal history of diseases of the blood and blood-forming organs and certain disorders involving the immune mechanism: Secondary | ICD-10-CM | POA: Diagnosis not present

## 2015-03-26 DIAGNOSIS — Z794 Long term (current) use of insulin: Secondary | ICD-10-CM | POA: Diagnosis not present

## 2015-03-26 DIAGNOSIS — E780 Pure hypercholesterolemia: Secondary | ICD-10-CM | POA: Diagnosis not present

## 2015-03-26 DIAGNOSIS — Z9889 Other specified postprocedural states: Secondary | ICD-10-CM | POA: Diagnosis not present

## 2015-03-26 DIAGNOSIS — Z792 Long term (current) use of antibiotics: Secondary | ICD-10-CM | POA: Diagnosis not present

## 2015-03-26 DIAGNOSIS — R1011 Right upper quadrant pain: Secondary | ICD-10-CM

## 2015-03-26 DIAGNOSIS — R509 Fever, unspecified: Secondary | ICD-10-CM | POA: Diagnosis not present

## 2015-03-26 DIAGNOSIS — Z8781 Personal history of (healed) traumatic fracture: Secondary | ICD-10-CM | POA: Diagnosis not present

## 2015-03-26 LAB — CBC WITH DIFFERENTIAL/PLATELET
Basophils Absolute: 0 10*3/uL (ref 0.0–0.1)
Basophils Relative: 0 % (ref 0–1)
EOS PCT: 2 % (ref 0–5)
Eosinophils Absolute: 0.1 10*3/uL (ref 0.0–0.7)
HCT: 35.6 % — ABNORMAL LOW (ref 36.0–46.0)
Hemoglobin: 11.4 g/dL — ABNORMAL LOW (ref 12.0–15.0)
LYMPHS ABS: 1.7 10*3/uL (ref 0.7–4.0)
Lymphocytes Relative: 25 % (ref 12–46)
MCH: 26.3 pg (ref 26.0–34.0)
MCHC: 32 g/dL (ref 30.0–36.0)
MCV: 82.2 fL (ref 78.0–100.0)
MONOS PCT: 7 % (ref 3–12)
Monocytes Absolute: 0.5 10*3/uL (ref 0.1–1.0)
NEUTROS ABS: 4.6 10*3/uL (ref 1.7–7.7)
NEUTROS PCT: 66 % (ref 43–77)
PLATELETS: 206 10*3/uL (ref 150–400)
RBC: 4.33 MIL/uL (ref 3.87–5.11)
RDW: 15.2 % (ref 11.5–15.5)
WBC: 6.9 10*3/uL (ref 4.0–10.5)

## 2015-03-26 LAB — URINALYSIS, ROUTINE W REFLEX MICROSCOPIC
BILIRUBIN URINE: NEGATIVE
Glucose, UA: 100 mg/dL — AB
Ketones, ur: NEGATIVE mg/dL
Leukocytes, UA: NEGATIVE
Nitrite: NEGATIVE
PROTEIN: NEGATIVE mg/dL
Specific Gravity, Urine: 1.014 (ref 1.005–1.030)
Urobilinogen, UA: 0.2 mg/dL (ref 0.0–1.0)
pH: 5.5 (ref 5.0–8.0)

## 2015-03-26 LAB — COMPREHENSIVE METABOLIC PANEL
ALBUMIN: 3.7 g/dL (ref 3.5–5.0)
ALT: 23 U/L (ref 14–54)
ANION GAP: 9 (ref 5–15)
AST: 31 U/L (ref 15–41)
Alkaline Phosphatase: 97 U/L (ref 38–126)
BUN: 27 mg/dL — ABNORMAL HIGH (ref 6–20)
CO2: 26 mmol/L (ref 22–32)
CREATININE: 1.56 mg/dL — AB (ref 0.44–1.00)
Calcium: 9.5 mg/dL (ref 8.9–10.3)
Chloride: 101 mmol/L (ref 101–111)
GFR calc non Af Amer: 35 mL/min — ABNORMAL LOW (ref >60)
GFR, EST AFRICAN AMERICAN: 41 mL/min — AB (ref >60)
GLUCOSE: 294 mg/dL — AB (ref 65–99)
Potassium: 4.4 mmol/L (ref 3.5–5.1)
SODIUM: 136 mmol/L (ref 135–145)
Total Bilirubin: 0.6 mg/dL (ref 0.3–1.2)
Total Protein: 7.8 g/dL (ref 6.5–8.1)

## 2015-03-26 LAB — URINE MICROSCOPIC-ADD ON

## 2015-03-26 LAB — HEMOGLOBIN A1C
HEMOGLOBIN A1C: 10.7 % — AB (ref <5.7)
Mean Plasma Glucose: 260 mg/dL — ABNORMAL HIGH (ref <117)

## 2015-03-26 LAB — TROPONIN I

## 2015-03-26 MED ORDER — DIPHENHYDRAMINE HCL 50 MG/ML IJ SOLN
12.5000 mg | Freq: Once | INTRAMUSCULAR | Status: AC
  Administered 2015-03-26: 12.5 mg via INTRAVENOUS
  Filled 2015-03-26: qty 1

## 2015-03-26 MED ORDER — METOCLOPRAMIDE HCL 5 MG/ML IJ SOLN
5.0000 mg | Freq: Once | INTRAMUSCULAR | Status: AC
  Administered 2015-03-26: 5 mg via INTRAVENOUS
  Filled 2015-03-26: qty 2

## 2015-03-26 MED ORDER — IOHEXOL 300 MG/ML  SOLN
50.0000 mL | Freq: Once | INTRAMUSCULAR | Status: AC | PRN
  Administered 2015-03-26: 50 mL via ORAL

## 2015-03-26 MED ORDER — OXYCODONE-ACETAMINOPHEN 5-325 MG PO TABS: 1.0000 | ORAL_TABLET | Freq: Four times a day (QID) | ORAL | Status: DC | PRN

## 2015-03-26 MED ORDER — OXYCODONE-ACETAMINOPHEN 5-325 MG PO TABS
1.0000 | ORAL_TABLET | Freq: Once | ORAL | Status: AC
  Administered 2015-03-26: 1 via ORAL
  Filled 2015-03-26: qty 1

## 2015-03-26 MED ORDER — SODIUM CHLORIDE 0.9 % IV BOLUS (SEPSIS)
1000.0000 mL | INTRAVENOUS | Status: AC
  Administered 2015-03-26: 1000 mL via INTRAVENOUS

## 2015-03-26 MED ORDER — FENTANYL CITRATE (PF) 100 MCG/2ML IJ SOLN
50.0000 ug | Freq: Once | INTRAMUSCULAR | Status: AC
  Administered 2015-03-26: 50 ug via INTRAVENOUS
  Filled 2015-03-26: qty 2

## 2015-03-26 NOTE — ED Notes (Signed)
Pt drinking CT contrast

## 2015-03-26 NOTE — ED Provider Notes (Signed)
CT without acute findings. Discussed with patient that she will need an MRI with and without contrast however that is not available here and given patient's stable vital signs and normal labs feel that this can be followed up as an outpatient. Patient has an appointment with her doctor on Tuesday.  Blanchie Dessert, MD 03/26/15 1701

## 2015-03-26 NOTE — ED Notes (Signed)
MD at bedside. 

## 2015-03-26 NOTE — Telephone Encounter (Signed)
Patient spoke to E Saguier regarding all results.

## 2015-03-26 NOTE — ED Notes (Signed)
Patient transported to X-ray 

## 2015-03-26 NOTE — Telephone Encounter (Signed)
Called pt to go over the lab results + imaging studies and to see how she was. I did leave a message. Since Friday and approaching long weekend, I did advise her if her symptoms change or worsen then ED evaluation. Asked to please call us back today.

## 2015-03-26 NOTE — Telephone Encounter (Signed)
Relation to pt: self Call back number:[phone]    Reason for call:  Pt inquiring about x ray and lab results.

## 2015-03-26 NOTE — ED Notes (Signed)
Pt's husband at bedside to pick pt up- pt ambulatory with steady gait

## 2015-03-26 NOTE — ED Notes (Signed)
Labs obtained with IV start

## 2015-03-26 NOTE — ED Notes (Signed)
Patient transported to CT 

## 2015-03-26 NOTE — ED Notes (Signed)
Pl[place]ett MD at bedside.
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
## 2015-03-28 ENCOUNTER — Telehealth: Payer: Self-pay | Admitting: Medical

## 2015-03-28 DIAGNOSIS — K769 Liver disease, unspecified: Secondary | ICD-10-CM

## 2015-03-28 NOTE — Telephone Encounter (Signed)
Abnormal [name]. Abnormal liver lesions.

## 2015-03-30 ENCOUNTER — Telehealth: Payer: Self-pay | Admitting: Internal Medicine

## 2015-03-30 ENCOUNTER — Ambulatory Visit: Payer: BLUE CROSS/BLUE SHIELD

## 2015-03-30 NOTE — Telephone Encounter (Signed)
Pt scheduled to see Amy Esterwood PA 04/05/15@2pm . Left message for Marge to call back.

## 2015-04-01 ENCOUNTER — Ambulatory Visit: Payer: BLUE CROSS/BLUE SHIELD | Admitting: Rehabilitation

## 2015-04-02 ENCOUNTER — Telehealth: Payer: Self-pay | Admitting: Family Medicine

## 2015-04-02 NOTE — Telephone Encounter (Signed)
Patient returned phone call. Best # [phone]    She states that her elbow is still swollen and hurting

## 2015-04-05 ENCOUNTER — Ambulatory Visit: Payer: BLUE CROSS/BLUE SHIELD | Admitting: Physician Assistant

## 2015-04-05 NOTE — Telephone Encounter (Signed)
Called patient with results,no answer mailed copy of report of XR.

## 2015-04-06 ENCOUNTER — Encounter: Payer: Self-pay | Admitting: Family Medicine

## 2015-04-06 ENCOUNTER — Ambulatory Visit (INDEPENDENT_AMBULATORY_CARE_PROVIDER_SITE_OTHER): Payer: BLUE CROSS/BLUE SHIELD | Admitting: Family Medicine

## 2015-04-06 VITALS — BP 122/70 | HR 88 | Temp 99.3°F | Ht 62.0 in | Wt 179.0 lb

## 2015-04-06 DIAGNOSIS — I10 Essential (primary) hypertension: Secondary | ICD-10-CM | POA: Diagnosis not present

## 2015-04-06 DIAGNOSIS — K7689 Other specified diseases of liver: Secondary | ICD-10-CM

## 2015-04-06 DIAGNOSIS — E785 Hyperlipidemia, unspecified: Secondary | ICD-10-CM | POA: Diagnosis not present

## 2015-04-06 DIAGNOSIS — E1165 Type 2 diabetes mellitus with hyperglycemia: Secondary | ICD-10-CM | POA: Diagnosis not present

## 2015-04-06 DIAGNOSIS — R319 Hematuria, unspecified: Secondary | ICD-10-CM

## 2015-04-06 DIAGNOSIS — IMO0002 Reserved for concepts with insufficient information to code with codable children: Secondary | ICD-10-CM

## 2015-04-06 LAB — BASIC METABOLIC PANEL
BUN: 20 mg/dL (ref 6–23)
CALCIUM: 9.6 mg/dL (ref 8.4–10.5)
CHLORIDE: 101 meq/L (ref 96–112)
CO2: 28 mEq/L (ref 19–32)
Creatinine, Ser: 1.33 mg/dL — ABNORMAL HIGH (ref 0.40–1.20)
GFR: 43.37 mL/min — ABNORMAL LOW (ref >60.00)
GLUCOSE: 178 mg/dL — AB (ref 70–99)
POTASSIUM: 4.5 meq/L (ref 3.5–5.1)
Sodium: 136 mEq/L (ref 135–145)

## 2015-04-06 LAB — POCT URINALYSIS DIPSTICK
BILIRUBIN UA: NEGATIVE
KETONES UA: NEGATIVE
LEUKOCYTES UA: NEGATIVE
Nitrite, UA: NEGATIVE
Spec Grav, UA: 1.025
Urobilinogen, UA: 0.2
pH, UA: 5

## 2015-04-06 LAB — HEPATIC FUNCTION PANEL
ALK PHOS: 97 U/L (ref 39–117)
ALT: 16 U/L (ref 0–35)
AST: 20 U/L (ref 0–37)
Albumin: 3.9 g/dL (ref 3.5–5.2)
BILIRUBIN TOTAL: 0.5 mg/dL (ref 0.2–1.2)
Bilirubin, Direct: 0.1 mg/dL (ref 0.0–0.3)
Total Protein: 7.6 g/dL (ref 6.0–8.3)

## 2015-04-06 LAB — LIPID PANEL
Cholesterol: 164 mg/dL (ref 0–200)
HDL: 39.7 mg/dL (ref >39.00)
LDL CALC: 102 mg/dL — AB (ref 0–99)
NONHDL: 124.3
Total CHOL/HDL Ratio: 4
Triglycerides: 110 mg/dL (ref 0.0–149.0)
VLDL: 22 mg/dL (ref 0.0–40.0)

## 2015-04-06 NOTE — Addendum Note (Signed)
Addended by: Harl Bowie on: 04/06/2015 01:54 PM   Modules accepted: Orders

## 2015-04-06 NOTE — Progress Notes (Signed)
Pre visit review using our clinic review tool, if applicable. No additional management support is needed unless otherwise documented below in the visit note. 

## 2015-04-06 NOTE — Progress Notes (Signed)
[name], female    DOB: 07-26-1956  Age: 59 y.o. MRN: [id number]    Subjective:  Subjective HPI [name] [name] presents for f/u dm, htn and cholesterol Pt missed gi appointment yesterday -- she states she never knew about appointment and was very apologetic.     HYPERTENSION  Blood pressure range-not checking   Chest pain- no      Dyspnea- no Lightheadedness- no   Edema- no Other side effects - no   Medication compliance: good Low salt diet- yes  DIABETES  Blood Sugar ranges- 140-200  Polyuria- no New Visual problems- yes-- under care of opth Hypoglycemic symptoms- no Other side effects-no Medication compliance - giid Last eye exam- recent Foot exam- today  HYPERLIPIDEMIA  Medication compliance- good RUQ pain- no  Muscle aches- no Other side effects-no     Review of Systems  Constitutional: Negative for activity change, appetite change, fatigue and unexpected weight change.  Respiratory: Negative for cough and shortness of breath.   Cardiovascular: Negative for chest pain and palpitations.  Psychiatric/Behavioral: Negative for behavioral problems and dysphoric mood. The patient is not nervous/anxious.     History Past Medical History  Diagnosis Date  . Hypertension   . Fibromyalgia   . High cholesterol   . PTSD (post-traumatic stress disorder)   . Anginal pain   . Proximal humerus fracture 10/15/12    Left  . Diabetes mellitus     insulin dependent, adult onset  . Anemia   . Fatty liver   . Anxiety   . Acute MI 2007    presented to ED & had cardiac cath- but found to have normal coronaries. Since that point in time her PCP cares f or cardiac needs. Dr. [name] - [facility]  . Exertional shortness of breath   . Osteoarthritis     hands, hips  . Chronic kidney disease     "had transplant when I was 15; doesn't bother me now" (03/20/2013)  . 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   Her family history includes Cancer in her father; Colitis in her father; Crohn's disease in her father; Diabetes in her brother, father, and mother; Heart disease in her father; Heart disease (age of onset: 26) in her brother; Hypertension in her mother; Irritable bowel syndrome in her daughter; Kidney disease in her brother; Kidney failure in her brother; Leukemia in her father; Mental illness in her mother.She reports that she has never smoked. She has never used smokeless tobacco. She reports that she does not drink alcohol or use illicit drugs.  Current Outpatient Prescriptions on File Prior to Visit  Medication Sig Dispense Refill  . atorvastatin (LIPITOR) 10 MG tablet Take 1 tablet (10 mg total) by mouth daily. 30 tablet 5  . Calcium Carbonate-Vitamin D (CALTRATE 600+D) 600-400 MG-UNIT per tablet Take 1 tablet by mouth daily.     . Cholecalciferol (VITAMIN D3) 5000 UNITS CAPS Take 1 capsule by mouth 2 (two) times daily.    [name] Kitchen CINNAMON PO Take 1,000 mg by mouth daily.    . DULoxetine (CYMBALTA) 60 MG capsule TAKE ONE  CAPSULE BY MOUTH ONCE DAILY 30 capsule 2  . Flaxseed, Linseed, (FLAX SEED OIL) 1000 MG CAPS Take 1 capsule by mouth at bedtime.    . fluticasone (FLONASE) 50 MCG/ACT nasal spray Place 2 sprays into both nostrils daily. 16 g 1  . gabapentin (NEURONTIN) 300 MG capsule Take 2 capsules (600 mg total) by mouth 3 (three) times daily. 180 capsule 5  . Insulin Glargine (LANTUS SOLOSTAR) 100 UNIT/ML Solostar Pen INJECT 40 UNITS INTO THE SKIN AT [facility]  labs are due now 15 mL 1  . Insulin Glulisine (APIDRA SOLOSTAR) 100 UNIT/ML Solostar Pen 10 u SQ BID 15 mL 11  . lisinopril (PRINIVIL,ZESTRIL) 5 MG tablet Take 1 tablet (5 mg total) by mouth daily. 30 tablet 5  . Multiple Vitamin (MULTIVITAMIN) tablet Take 1 tablet by mouth daily.     . ondansetron (ZOFRAN ODT) 8 MG disintegrating tablet Take 1 tablet (8 mg total) by mouth every 8 (eight) hours as needed for nausea or vomiting. 6 tablet 0  . polyethylene glycol (MIRALAX / GLYCOLAX) packet Take 17 g by mouth daily.      . predniSONE (DELTASONE) 10 MG tablet 3 po qd for 3 days then 2 po qd for 3 days the 1 po qd for 3 days 18 tablet 0  . hydrochlorothiazide (HYDRODIURIL) 25 MG tablet Take 1 tablet (25 mg total) by mouth daily. (Patient not taking: Reported on 04/06/2015) 30 tablet 11  . [DISCONTINUED] metFORMIN (GLUCOPHAGE) 1000 MG tablet Take 1,000 mg by mouth 2 (two) times daily with a meal.      . [DISCONTINUED] omeprazole (PRILOSEC) 20 MG capsule Take 20 mg by mouth daily.       No current facility-administered medications on file prior to visit.     Objective:  Objective Physical Exam  Constitutional: She is oriented to person, place, and time. She appears well-developed and well-nourished.  HENT:  Head: Normocephalic and atraumatic.  Eyes: Conjunctivae and EOM are normal.  Neck: Normal range of motion. Neck supple. No JVD present. Carotid bruit is not present. No thyromegaly present.  Cardiovascular: Normal rate, regular rhythm and normal heart sounds.   No murmur heard. Pulmonary/Chest: Effort normal and breath sounds normal. No respiratory distress. She has no wheezes. She has no rales. She exhibits no tenderness.  Musculoskeletal: She exhibits no edema.  Neurological: She is alert and oriented to person, place, and time.  Psychiatric: She has a normal mood and affect. Her behavior is normal.  Sensory exam of the foot is normal, tested with the monofilament. Good pulses, no lesions or  ulcers, good peripheral pulses.  BP 122/70 mmHg  Pulse 88  Temp(Src) 99.3 F (37.4 C) (Oral)  Ht 5' 2"  (1.575 m)  Wt 179 lb (81.194 kg)  BMI 32.73 kg/m2  SpO2 98% Wt Readings from Last 3 Encounters:  04/06/15 179 lb (81.194 kg)  03/25/15 178 lb (80.74 kg)  02/27/15 140 lb (63.504 kg)     Lab Results  Component Value Date   WBC 6.9 03/26/2015   HGB 11.4* 03/26/2015   HCT 35.6* 03/26/2015   PLT 206 03/26/2015   GLUCOSE 294* 03/26/2015   CHOL 118 03/03/2014   TRIG 106.0 03/03/2014   HDL 37.20* 03/03/2014   LDLDIRECT 115.8 01/23/2012   LDLCALC 60 03/03/2014   ALT 23 03/26/2015   AST 31 03/26/2015   NA 136 03/26/2015   K 4.4 03/26/2015   CL 101 03/26/2015   CREATININE 1.56* 03/26/2015   BUN 27* 03/26/2015  CO2 26 03/26/2015   TSH 3.057 04/04/2013   TSH 2.934 04/04/2013   INR 1.44 03/21/2013   HGBA1C 10.7* 03/25/2015   MICROALBUR 1.67 04/04/2013    Ct Abdomen Pelvis Wo Contrast  03/26/2015   CLINICAL DATA:  Right-sided abdominal pain with nausea and vomiting. History of diabetes and hypertension as well as a C-section and hysterectomy. History of a cholecystectomy. Ultrasound from the previous day's showed multiple hypoechoic liver lesions.  EXAM: CT ABDOMEN AND PELVIS WITHOUT CONTRAST  TECHNIQUE: Multidetector CT imaging of the abdomen and pelvis was performed following the standard protocol without IV contrast.  COMPARISON:  Ultrasound, 03/25/2015.  CT, 07/24/2014.  FINDINGS: Lung bases:  Essentially clear.  Heart normal in size.  Liver: There is a mottled appearance to the liver without a discrete mass or lesion. Stable calcifications are noted mostly in the left lobe. There is some central volume loss and mild surface nodularity as well as enlargement of the caudate lobe. This is stable from the prior CT.  Spleen: Stable calcifications. Slightly enlarged measuring 13 cm in greatest dimension. No mass.  Gallbladder and biliary tree: Gallbladder surgically absent. No bile  duct dilation.  Pancreas:  Unremarkable.  Adrenal glands: No masses.  Kidneys, ureters and bladder: No renal masses or stones. No hydronephrosis. Normal ureters. Bladder is unremarkable.  Uterus and adnexa:  Uterus surgically absent.  No adnexal masses.  Lymph nodes:  No enlarged lymph nodes.  Ascites: None.  Vascular: No aneurysm. Mild atherosclerotic plaque along the iliac arteries.  Bowel:  Unremarkable.  Appendix: Normal.  Musculoskeletal: Mild degenerative changes of the visualized spine. No osteoblastic or osteolytic lesions.  IMPRESSION: 1. Small hypoechoic nodules noted on ultrasound are not resolved by CT. The CT of appearance of the liver is consistent with cirrhosis. No mass is seen in the liver on CT, but the study was limited by lack of intravenous contrast. Liver is best assessed with MRI with and without contrast. There stable liver and splenic calcifications consistent with healed granulomatous disease. Mild splenomegaly, with the spleen measuring 13 cm x 4.8 cm x 13 cm. These findings are stable from the prior CT. 2. No acute findings.  Normal appendix visualized. 3. Status post hysterectomy and cholecystectomy.   Electronically Signed   By: Lajean Manes M.D.   On: 03/26/2015 16:45   Dg Chest 2 View  03/26/2015   CLINICAL DATA:  Syncope.  EXAM: CHEST  2 VIEW  COMPARISON:  December 10, 2014.  FINDINGS: The heart size and mediastinal contours are within normal limits. Both lungs are clear. No pneumothorax or pleural effusion is noted. Old left clavicular fracture is noted.  IMPRESSION: No active cardiopulmonary disease.   Electronically Signed   By: Marijo Conception, M.D.   On: 03/26/2015 14:28   Ct Head Wo Contrast  03/26/2015   CLINICAL DATA:  One week history of right-sided headache. Near syncopal episode  EXAM: CT HEAD WITHOUT CONTRAST  TECHNIQUE: Contiguous axial images were obtained from the base of the skull through the vertex without intravenous contrast.  COMPARISON:  April 26, 2014 head  CT and brain MRI December 22, 2013  FINDINGS: The ventricles are normal in size and configuration. There is a stable posterior fossa arachnoid cyst in the midline measuring 2.9 x 3.2 cm. There is no other evidence of mass. There is no hemorrhage, extra-axial fluid collection, or midline shift. Gray-white compartments are normal. There is no evident acute infarct. The bony calvarium appears intact. The mastoid air cells  are clear.  IMPRESSION: Stable posterior fossa midline arachnoid cyst. No intracranial hemorrhage. Gray-white compartments are normal. No evidence suggesting acute infarct.   Electronically Signed   By: Lowella Grip III M.D.   On: 03/26/2015 14:31     Assessment & Plan:  Plan I have discontinued Ms. Llerenas-Reed's MOVIPREP, clarithromycin, benzonatate, ranitidine, traMADol, and oxyCODONE-acetaminophen. I am also having her maintain her Calcium Carbonate-Vitamin D, multivitamin, polyethylene glycol, hydrochlorothiazide, CINNAMON PO, Vitamin D3, Flax Seed Oil, atorvastatin, Insulin Glulisine, gabapentin, fluticasone, predniSONE, Insulin Glargine, lisinopril, DULoxetine, ondansetron, ENSURE PLUS, and vitamin C.  Meds ordered this encounter  Medications  . ENSURE PLUS (ENSURE PLUS) LIQD    Sig: Take 237 mLs by mouth.  . vitamin C (ASCORBIC ACID) 500 MG tablet    Sig: Take 500 mg by mouth daily.    Problem List Items Addressed This Visit    Essential hypertension    Stable con't lisinopril and hct        Other Visit Diagnoses    Diabetes mellitus type II, uncontrolled    -  Primary    Relevant Orders    Basic metabolic panel    Lipid panel    Microalbumin / creatinine urine ratio    POCT urinalysis dipstick    Hepatic cyst        Relevant Orders    Hepatic function panel    MR Liver W Wo Contrast    Hyperlipidemia        Relevant Orders    Hepatic function panel    Lipid panel    Microalbumin / creatinine urine ratio       Follow-up: Return in about 6 months  (around 10/06/2015), or if symptoms worsen or fail to improve.  Garnet Koyanagi, DO

## 2015-04-06 NOTE — Assessment & Plan Note (Signed)
Stable con't lisinopril and hct

## 2015-04-06 NOTE — Patient Instructions (Signed)

## 2015-04-07 LAB — MICROALBUMIN / CREATININE URINE RATIO
CREATININE, U: 132.7 mg/dL
MICROALB/CREAT RATIO: 7.2 mg/g (ref 0.0–30.0)
Microalb, Ur: 9.5 mg/dL — ABNORMAL HIGH (ref 0.0–1.9)

## 2015-04-08 LAB — URINE CULTURE

## 2015-04-10 ENCOUNTER — Ambulatory Visit (HOSPITAL_BASED_OUTPATIENT_CLINIC_OR_DEPARTMENT_OTHER)
Admission: RE | Admit: 2015-04-10 | Discharge: 2015-04-10 | Disposition: A | Discharge status: Home / Self Care | Payer: BLUE CROSS/BLUE SHIELD | Source: Ambulatory Visit | Attending: Family Medicine | Admitting: Family Medicine

## 2015-04-10 DIAGNOSIS — R1011 Right upper quadrant pain: Secondary | ICD-10-CM | POA: Insufficient documentation

## 2015-04-10 DIAGNOSIS — K7460 Unspecified cirrhosis of liver: Secondary | ICD-10-CM | POA: Insufficient documentation

## 2015-04-10 DIAGNOSIS — K7689 Other specified diseases of liver: Secondary | ICD-10-CM

## 2015-04-10 MED ORDER — GADOBENATE DIMEGLUMINE 529 MG/ML IV SOLN
8.0000 mL | Freq: Once | INTRAVENOUS | Status: AC | PRN
  Administered 2015-04-10: 8 mL via INTRAVENOUS

## 2015-04-13 ENCOUNTER — Telehealth: Payer: Self-pay | Admitting: *Deleted

## 2015-04-13 DIAGNOSIS — E785 Hyperlipidemia, unspecified: Secondary | ICD-10-CM

## 2015-04-13 NOTE — Telephone Encounter (Addendum)
Called and spoke with the pt and informed her of recent lab results and note.   Pt verbalized understanding.  Pt stated that she has allergies to fish,but she can take Flaxseed oil.  Pt stated that she will call back to schedule lab appt for 3 months.  Future labs ordered.  Recent labs faxed to Dr. Erling Cruz @ [facility] and Dr. Scarlette Shorts @ [facility].//AB/CMA

## 2015-04-13 NOTE — Telephone Encounter (Signed)
-----   Message from Rosalita Chessman, DO sent at 04/09/2015  8:49 PM EDT ----- Forward to [facility] Cholesterol--- LDL goal < 100,  HDL >40,  TG < 150.  Diet and exercise will increase HDL and decrease LDL and TG.  Fish,  Fish Oil, Flaxseed oil will also help increase the HDL and decrease Triglycerides.   Recheck labs in 3 months----  Liipid, hep,bmp.

## 2015-04-15 ENCOUNTER — Telehealth: Payer: Self-pay | Admitting: *Deleted

## 2015-04-15 DIAGNOSIS — E785 Hyperlipidemia, unspecified: Secondary | ICD-10-CM

## 2015-04-15 DIAGNOSIS — I10 Essential (primary) hypertension: Secondary | ICD-10-CM

## 2015-04-15 NOTE — Telephone Encounter (Signed)
-----   Message from Rosalita Chessman, DO sent at 04/09/2015  8:49 PM EDT ----- Forward to [facility] Cholesterol--- LDL goal < 100,  HDL >40,  TG < 150.  Diet and exercise will increase HDL and decrease LDL and TG.  Fish,  Fish Oil, Flaxseed oil will also help increase the HDL and decrease Triglycerides.   Recheck labs in 3 months----  Liipid, hep,bmp.

## 2015-04-15 NOTE — Telephone Encounter (Signed)
Notified pt and she voices understanding. Pt will be seeing Dr Henrene Pastor for GI f/u. Results forwarded to Dr Florene Glen, nephrologist. Lab appt scheduled for 07/20/15 at 8:15am.  Lab orders entered.

## 2015-04-19 NOTE — Telephone Encounter (Signed)
Patient was a no show for 04/05/15 with Nicoletta Ba PA.  No return call from the clinic or patient.  Will await a return call from them

## 2015-04-29 ENCOUNTER — Other Ambulatory Visit: Payer: Self-pay | Admitting: Family Medicine

## 2015-04-29 NOTE — Telephone Encounter (Signed)
Patient states that she is out.

## 2015-05-04 ENCOUNTER — Telehealth: Payer: Self-pay | Admitting: Family Medicine

## 2015-05-04 NOTE — Telephone Encounter (Signed)
Called to follow up with patient.  Pt states symptoms still persist, but have not worsen.  Appointment scheduled for tomorrow at 2 pm with Mackie Pai, PA-C.  Earlier appointment offered, but patient declined.

## 2015-05-04 NOTE — Telephone Encounter (Signed)
Patient Name: [name] [name]  DOB: [birth year]    Initial Comment Caller states having pain all over HX of fibromyalgia, when she stands she gets dizzy HX of diabetes. has been having problems with swelling since last week, HX of Kidney problems and liver problems.    Nurse Assessment      Guidelines    Guideline Title Affirmed Question Affirmed Notes       Final Disposition User   FINAL ATTEMPT MADE - message left Mallie Mussel, RN, [name]

## 2015-05-05 ENCOUNTER — Encounter: Payer: Self-pay | Admitting: Medical

## 2015-05-05 ENCOUNTER — Telehealth: Payer: Self-pay | Admitting: Medical

## 2015-05-05 ENCOUNTER — Ambulatory Visit (HOSPITAL_BASED_OUTPATIENT_CLINIC_OR_DEPARTMENT_OTHER)
Admission: RE | Admit: 2015-05-05 | Discharge: 2015-05-05 | Disposition: A | Discharge status: Home / Self Care | Payer: BLUE CROSS/BLUE SHIELD | Source: Ambulatory Visit | Attending: Medical | Admitting: Medical

## 2015-05-05 ENCOUNTER — Ambulatory Visit (INDEPENDENT_AMBULATORY_CARE_PROVIDER_SITE_OTHER): Payer: BC Managed Care – PPO | Admitting: Medical

## 2015-05-05 ENCOUNTER — Other Ambulatory Visit: Payer: Self-pay | Admitting: Medical

## 2015-05-05 VITALS — BP 123/69 | HR 100 | Temp 99.3°F | Ht 62.0 in | Wt 178.8 lb

## 2015-05-05 DIAGNOSIS — J329 Chronic sinusitis, unspecified: Secondary | ICD-10-CM | POA: Insufficient documentation

## 2015-05-05 DIAGNOSIS — M797 Fibromyalgia: Secondary | ICD-10-CM | POA: Diagnosis not present

## 2015-05-05 DIAGNOSIS — M2550 Pain in unspecified joint: Secondary | ICD-10-CM

## 2015-05-05 DIAGNOSIS — J0100 Acute maxillary sinusitis, unspecified: Secondary | ICD-10-CM | POA: Diagnosis not present

## 2015-05-05 DIAGNOSIS — N289 Disorder of kidney and ureter, unspecified: Secondary | ICD-10-CM | POA: Diagnosis not present

## 2015-05-05 DIAGNOSIS — G629 Polyneuropathy, unspecified: Secondary | ICD-10-CM

## 2015-05-05 DIAGNOSIS — M79644 Pain in right finger(s): Secondary | ICD-10-CM | POA: Insufficient documentation

## 2015-05-05 HISTORY — DX: Disorder of kidney and ureter, unspecified: N28.9

## 2015-05-05 LAB — RHEUMATOID FACTOR: Rhuematoid fact SerPl-aCnc: <10 IU/mL (ref <=14)

## 2015-05-05 MED ORDER — AZITHROMYCIN 250 MG PO TABS: ORAL_TABLET | ORAL | Status: DC

## 2015-05-05 MED ORDER — PREGABALIN 75 MG PO CAPS: 75.0000 mg | ORAL_CAPSULE | Freq: Two times a day (BID) | ORAL | Status: DC

## 2015-05-05 NOTE — Telephone Encounter (Signed)
I talked with pt before she left. She was going to stop neurontin and start lyrica. LPN advised pharmacy that was the plan when they called.

## 2015-05-05 NOTE — Assessment & Plan Note (Signed)
Rt hand xray and arthritis panel.

## 2015-05-05 NOTE — Progress Notes (Signed)
Pre visit review using our clinic review tool, if applicable. No additional management support is needed unless otherwise documented below in the visit note. 

## 2015-05-05 NOTE — Assessment & Plan Note (Signed)
With possible rt om. Rx azithromycin. Not dizziness seems to have coincided with sinus infection onset  but if worsens or neurologic signs or symptoms then ED evaluation.
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
## 2015-05-05 NOTE — Progress Notes (Unsigned)
Pharmacy called states Lyrica and Gabapinten now advised to be taken together. Says patient was told to take both. Advised pharmacy that patient was mistaken and should only take Lyrica and hold Gabapentin until further notice.

## 2015-05-05 NOTE — Assessment & Plan Note (Addendum)
Rx lyrica today. Continue cymbalta. Stop neurotin. I discussed this pt today before she left this was the plan.  Pharmacy later called and lpn told pharmacy the same.

## 2015-05-05 NOTE — Patient Instructions (Signed)
Fibromyalgia Rx lyrica today. Continue cymbalta and neurontin.  Sinusitis, acute maxillary With possible rt om. Rx azithromycin. Not dizziness seems to have coincided with sinus infection onset  but if worsens or neurologic signs or symptoms then ED evaluation.  Arthralgia Rt hand xray and arthritis panel.    Renal insufficiency- will get cmp today.  Follow up in 2 wks or as needed

## 2015-05-06 ENCOUNTER — Other Ambulatory Visit: Payer: Self-pay | Admitting: Family Medicine

## 2015-05-06 ENCOUNTER — Telehealth: Payer: Self-pay | Admitting: Family Medicine

## 2015-05-06 LAB — COMPREHENSIVE METABOLIC PANEL
ALBUMIN: 3.7 g/dL (ref 3.5–5.2)
ALT: 19 U/L (ref 0–35)
AST: 21 U/L (ref 0–37)
Alkaline Phosphatase: 90 U/L (ref 39–117)
BILIRUBIN TOTAL: 0.6 mg/dL (ref 0.2–1.2)
BUN: 24 mg/dL — ABNORMAL HIGH (ref 6–23)
CO2: 28 meq/L (ref 19–32)
Calcium: 9.4 mg/dL (ref 8.4–10.5)
Chloride: 102 mEq/L (ref 96–112)
Creatinine, Ser: 1.5 mg/dL — ABNORMAL HIGH (ref 0.40–1.20)
GFR: 37.74 mL/min — AB (ref >60.00)
GLUCOSE: 273 mg/dL — AB (ref 70–99)
POTASSIUM: 4.9 meq/L (ref 3.5–5.1)
Sodium: 136 mEq/L (ref 135–145)
Total Protein: 7.4 g/dL (ref 6.0–8.3)

## 2015-05-06 LAB — SEDIMENTATION RATE: Sed Rate: 41 mm/hr — ABNORMAL HIGH (ref 0–22)

## 2015-05-06 LAB — C-REACTIVE PROTEIN: CRP: 0.3 mg/dL — ABNORMAL LOW (ref 0.5–20.0)

## 2015-05-06 LAB — ANA: Anti Nuclear Antibody(ANA): NEGATIVE

## 2015-05-06 NOTE — Telephone Encounter (Signed)
She needs a refill on cymbalta to [facility] [address] [facility] in [place].  It will take 5 to 7 days for the lyrica to get into her system and she needs some pain pills to get through this.  Call [phone] when rx for pain med rx is ready  And she will come pick it up

## 2015-05-07 ENCOUNTER — Encounter: Payer: Self-pay | Admitting: Medical

## 2015-05-07 MED ORDER — HYDROCODONE-ACETAMINOPHEN 5-325 MG PO TABS: 1.0000 | ORAL_TABLET | Freq: Four times a day (QID) | ORAL | Status: DC | PRN

## 2015-05-07 MED ORDER — DULOXETINE HCL 60 MG PO CPEP: 60.0000 mg | ORAL_CAPSULE | Freq: Every day | ORAL | Status: DC

## 2015-05-07 NOTE — Telephone Encounter (Signed)
Referral to nephrologist.

## 2015-05-07 NOTE — Telephone Encounter (Signed)
rx refill cymbalta and limited rx norco.

## 2015-05-07 NOTE — Progress Notes (Signed)
LEft message on patients answering machine that Rx(Hydrocodone) was ready to picked up at front desk.

## 2015-05-07 NOTE — Telephone Encounter (Signed)
Left message for patient regarding referral.

## 2015-05-07 NOTE — Telephone Encounter (Signed)
Left message for patient that Cymbalta was sent to pharmacy and to limit number of hydrocodone she takes.

## 2015-05-13 ENCOUNTER — Telehealth: Payer: Self-pay | Admitting: Family Medicine

## 2015-05-13 NOTE — Telephone Encounter (Signed)
Appointment scheduled.

## 2015-05-13 NOTE — Telephone Encounter (Signed)
Called patient to see if she would like to be placed on today's schedule. Left message for callback.

## 2015-05-13 NOTE — Telephone Encounter (Signed)
Caller name: Patrycja Relation to pt: Call back number: [phone] Pharmacy: [facility] [address]  Reason for call:   Patient states that she is not feeling any better since last visit. Would like another z-pack called in. Symptoms are stuffy nose, ear stopped up, head congestion. Saw edward for this and requesting callback

## 2015-05-14 ENCOUNTER — Ambulatory Visit (INDEPENDENT_AMBULATORY_CARE_PROVIDER_SITE_OTHER): Payer: BLUE CROSS/BLUE SHIELD | Admitting: Medical

## 2015-05-14 ENCOUNTER — Encounter: Payer: Self-pay | Admitting: Medical

## 2015-05-14 VITALS — BP 126/53 | HR 94 | Temp 99.9°F | Wt 180.4 lb

## 2015-05-14 DIAGNOSIS — J019 Acute sinusitis, unspecified: Secondary | ICD-10-CM

## 2015-05-14 DIAGNOSIS — M797 Fibromyalgia: Secondary | ICD-10-CM | POA: Diagnosis not present

## 2015-05-14 DIAGNOSIS — M25529 Pain in unspecified elbow: Secondary | ICD-10-CM | POA: Insufficient documentation

## 2015-05-14 DIAGNOSIS — M25521 Pain in right elbow: Secondary | ICD-10-CM

## 2015-05-14 DIAGNOSIS — M79621 Pain in right upper arm: Secondary | ICD-10-CM

## 2015-05-14 MED ORDER — HYDROCODONE-ACETAMINOPHEN 5-325 MG PO TABS: 1.0000 | ORAL_TABLET | Freq: Four times a day (QID) | ORAL | Status: DC | PRN

## 2015-05-14 MED ORDER — PREGABALIN 150 MG PO CAPS: 150.0000 mg | ORAL_CAPSULE | Freq: Two times a day (BID) | ORAL | Status: DC

## 2015-05-14 NOTE — Progress Notes (Signed)
   Subjective:    [name], female    DOB: 14-Aug-1956, 59 y.o.   MRN: [id number]  HPI  Pt in for follow up. She states azithromycin did not help much. Pt states she thinks may need another round of antibiotics. Maxillary and sinus pressure. Her rt ear has pressure. No fever, no chills or sweats.   Pt is expresses major frustration with the fact that lyrica not helping as much as her gabapentin did. Pt has fibromyalgia. She is on cymbalta as well.Pt was very rude in explaining how much pain she was in recently(note she asked to be back on lyrica having used it before and stating it worked better than neurontin. Before she left office we discussed her stopping the neurontin and her pharmacist told her the same.  Pt is frustrated with her rt upper extremity hand pain as well post surgery(followed by prior surgeon). Also pain to feet burning sensation.        Review of Systems  Constitutional: Negative for fever, chills and fatigue.  HENT: Positive for congestion and sinus pressure.   Respiratory: Negative for cough, choking, chest tightness and wheezing.   Cardiovascular: Negative for chest pain and palpitations.  Musculoskeletal:       Rt upper ext pain since surgery. Had complications.  Skin: Negative for rash.  Neurological:       Fibromyalgia pain and neuropathic type pain to her feet.  Hematological: Negative for adenopathy. Does not bruise/bleed easily.  Psychiatric/Behavioral: Negative for behavioral problems and confusion.        Objective:   Physical Exam  General  Mental Status - Alert. General Appearance - Well groomed. Not in acute distress.  Skin Rashes- No Rashes.  HEENT Head- Normal. Ear Auditory Canal - Left- Normal. Right - Normal.Tympanic Membrane- Left- Normal. Right- Normal. Eye Sclera/Conjunctiva- Left- Normal. Right- Normal. Nose & Sinuses Nasal Mucosa- Left-  Boggy and Congested. Right-  Boggy and  Congested.Bilateral  maxillary and frontal sinus pressure. Mouth & Throat Lips: Upper Lip- Normal: no dryness, cracking, pallor, cyanosis, or vesicular eruption. Lower Lip-Normal: no dryness, cracking, pallor, cyanosis or vesicular eruption. Buccal Mucosa- Bilateral- No Aphthous ulcers. Oropharynx- No Discharge or Erythema. Tonsils: Characteristics- Bilateral- No Erythema or Congestion. Size/Enlargement- Bilateral- No enlargement. Discharge- bilateral-None.  Neck Neck- Supple. No Masses.   Chest and Lung Exam Auscultation: Breath Sounds:-Clear even and unlabored.  Cardiovascular Auscultation:Rythm- Regular, rate and rhythm. Murmurs & Other Heart Sounds:Ausculatation of the heart reveal- No Murmurs.  Lymphatic Head & Neck General Head & Neck Lymphatics: Bilateral: Description- No Localized lymphadenopathy.  Rt upper ext- obvious post surgical changes. Limit range of motion.       Assessment & Plan:  Note will see pt if scheduled with me but if again rude and demeaning as was today then would discuss with Supervising physician.

## 2015-05-14 NOTE — Progress Notes (Signed)
Pre visit review using our clinic review tool, if applicable. No additional management support is needed unless otherwise documented below in the visit note. 

## 2015-05-14 NOTE — Patient Instructions (Signed)
Acute sinusitis with symptoms > 10 days Rx levofloxin. If pain persists past this treatment then consider CT of sinus or referral to ent.  Fibromyalgia lyrica 150 mg twice daily.  Pain in joint, upper arm Limited rx of hydrocodone.    Follow up in 3 weeks or as needed

## 2015-05-14 NOTE — Assessment & Plan Note (Signed)
Rx levofloxin. If pain persists past this treatment then consider CT of sinus or referral to ent.

## 2015-05-14 NOTE — Assessment & Plan Note (Signed)
lyrica 150 mg twice daily.

## 2015-05-14 NOTE — Assessment & Plan Note (Signed)
Limited rx of hydrocodone.

## 2015-05-17 ENCOUNTER — Telehealth: Payer: Self-pay | Admitting: Medical

## 2015-05-17 DIAGNOSIS — M2550 Pain in unspecified joint: Secondary | ICD-10-CM

## 2015-05-17 MED ORDER — LEVOFLOXACIN 500 MG PO TABS: 500.0000 mg | ORAL_TABLET | Freq: Every day | ORAL | Status: DC

## 2015-05-18 NOTE — Telephone Encounter (Signed)
Referred for arthralgias.

## 2015-06-08 ENCOUNTER — Ambulatory Visit: Payer: BLUE CROSS/BLUE SHIELD | Admitting: Family Medicine

## 2015-06-14 ENCOUNTER — Ambulatory Visit (INDEPENDENT_AMBULATORY_CARE_PROVIDER_SITE_OTHER): Payer: BLUE CROSS/BLUE SHIELD | Admitting: Family Medicine

## 2015-06-14 ENCOUNTER — Encounter: Payer: Self-pay | Admitting: Family Medicine

## 2015-06-14 ENCOUNTER — Telehealth: Payer: Self-pay | Admitting: Family Medicine

## 2015-06-14 VITALS — BP 100/62 | HR 90 | Temp 99.0°F | Wt 182.8 lb

## 2015-06-14 DIAGNOSIS — E1040 Type 1 diabetes mellitus with diabetic neuropathy, unspecified: Secondary | ICD-10-CM

## 2015-06-14 DIAGNOSIS — M797 Fibromyalgia: Secondary | ICD-10-CM | POA: Diagnosis not present

## 2015-06-14 DIAGNOSIS — G894 Chronic pain syndrome: Secondary | ICD-10-CM | POA: Diagnosis not present

## 2015-06-14 DIAGNOSIS — I10 Essential (primary) hypertension: Secondary | ICD-10-CM

## 2015-06-14 DIAGNOSIS — M2550 Pain in unspecified joint: Secondary | ICD-10-CM

## 2015-06-14 DIAGNOSIS — E119 Type 2 diabetes mellitus without complications: Secondary | ICD-10-CM

## 2015-06-14 DIAGNOSIS — Z794 Long term (current) use of insulin: Secondary | ICD-10-CM

## 2015-06-14 DIAGNOSIS — E785 Hyperlipidemia, unspecified: Secondary | ICD-10-CM

## 2015-06-14 DIAGNOSIS — R809 Proteinuria, unspecified: Secondary | ICD-10-CM

## 2015-06-14 MED ORDER — PREGABALIN 150 MG PO CAPS: 150.0000 mg | ORAL_CAPSULE | Freq: Three times a day (TID) | ORAL | Status: DC | PRN

## 2015-06-14 MED ORDER — OXYCODONE-ACETAMINOPHEN 5-325 MG PO TABS: 1.0000 | ORAL_TABLET | ORAL | Status: DC | PRN

## 2015-06-14 NOTE — Telephone Encounter (Signed)
I put the referral in for pain management while pt was here.  We discussed it and told her it would take a while.

## 2015-06-14 NOTE — Progress Notes (Signed)
Pre visit review using our clinic review tool, if applicable. No additional management support is needed unless otherwise documented below in the visit note. 

## 2015-06-14 NOTE — Telephone Encounter (Signed)
TO MD to review.     KP
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
## 2015-06-14 NOTE — Assessment & Plan Note (Signed)
con't lipitor Check labs next month

## 2015-06-14 NOTE — Assessment & Plan Note (Signed)
con't lisinopril stable

## 2015-06-14 NOTE — Patient Instructions (Addendum)
Neuropathic Pain We often think that pain has a physical cause. If we get rid of the cause, the pain should go away. Nerves themselves can also cause pain. It is called neuropathic pain, which means nerve abnormality. It may be difficult for the patients who have it and for the treating caregivers. Pain is usually described as acute (short-lived) or chronic (long-lasting). Acute pain is related to the physical sensations caused by an injury. It can last from a few seconds to many weeks, but it usually goes away when normal healing occurs. Chronic pain lasts beyond the typical healing time. With neuropathic pain, the nerve fibers themselves may be damaged or injured. They then send incorrect signals to other pain centers. The pain you feel is real, but the cause is not easy to find.  CAUSES  Chronic pain can result from diseases, such as diabetes and shingles (an infection related to chickenpox), or from trauma, surgery, or amputation. It can also happen without any known injury or disease. The nerves are sending pain messages, even though there is no identifiable cause for such messages.   Other common causes of neuropathy include diabetes, phantom limb pain, or Regional Pain Syndrome (RPS).  As with all forms of chronic back pain, if neuropathy is not correctly treated, there can be a number of associated problems that lead to a downward cycle for the patient. These include depression, sleeplessness, feelings of fear and anxiety, limited social interaction and inability to do normal daily activities or work.  The most dramatic and mysterious example of neuropathic pain is called "phantom limb syndrome." This occurs when an arm or a leg has been removed because of illness or injury. The brain still gets pain messages from the nerves that originally carried impulses from the missing limb. These nerves now seem to misfire and cause troubling pain.  Neuropathic pain often seems to have no cause. It responds  poorly to standard pain treatment. Neuropathic pain can occur after:  Shingles (herpes zoster virus infection).  A lasting burning sensation of the skin, caused usually by injury to a peripheral nerve.  Peripheral neuropathy which is widespread nerve damage, often caused by diabetes or alcoholism.  Phantom limb pain following an amputation.  Facial nerve problems (trigeminal neuralgia).  Multiple sclerosis.  Reflex sympathetic dystrophy.  Pain which comes with cancer and cancer chemotherapy.  Entrapment neuropathy such as when pressure is put on a nerve such as in carpal tunnel syndrome.  Back, leg, and hip problems (sciatica).  Spine or back surgery.  HIV Infection or AIDS where nerves are infected by viruses. Your caregiver can explain items in the above list which may apply to you. SYMPTOMS  Characteristics of neuropathic pain are:  Severe, sharp, electric shock-like, shooting, lightening-like, knife-like.  Pins and needles sensation.  Deep burning, deep cold, or deep ache.  Persistent numbness, tingling, or weakness.  Pain resulting from light touch or other stimulus that would not usually cause pain.  Increased sensitivity to something that would normally cause pain, such as a pinprick. Pain may persist for months or years following the healing of damaged tissues. When this happens, pain signals no longer sound an alarm about current injuries or injuries about to happen. Instead, the alarm system itself is not working correctly.  Neuropathic pain may get worse instead of better over time. For some people, it can lead to serious disability. It is important to be aware that severe injury in a limb can occur without a proper, protective pain   response.Burns, cuts, and other injuries may go unnoticed. Without proper treatment, these injuries can become infected or lead to further disability. Take any injury seriously, and consult your caregiver for treatment. DIAGNOSIS   When you have a pain with no known cause, your caregiver will probably ask some specific questions:   Do you have any other conditions, such as diabetes, shingles, multiple sclerosis, or HIV infection?  How would you describe your pain? (Neuropathic pain is often described as shooting, stabbing, burning, or searing.)  Is your pain worse at any time of the day? (Neuropathic pain is usually worse at night.)  Does the pain seem to follow a certain physical pathway?  Does the pain come from an area that has missing or injured nerves? (An example would be phantom limb pain.)  Is the pain triggered by minor things such as rubbing against the sheets at night? These questions often help define the type of pain involved. Once your caregiver knows what is happening, treatment can begin. Anticonvulsant, antidepressant drugs, and various pain relievers seem to work in some cases. If another condition, such as diabetes is involved, better management of that disorder may relieve the neuropathic pain.  TREATMENT  Neuropathic pain is frequently long-lasting and tends not to respond to treatment with narcotic type pain medication. It may respond well to other drugs such as antiseizure and antidepressant medications. Usually, neuropathic problems do not completely go away, but partial improvement is often possible with proper treatment. Your caregivers have large numbers of medications available to treat you. Do not be discouraged if you do not get immediate relief. Sometimes different medications or a combination of medications will be tried before you receive the results you are hoping for. See your caregiver if you have pain that seems to be coming from nowhere and does not go away. Help is available.  SEEK IMMEDIATE MEDICAL CARE IF:   There is a sudden change in the quality of your pain, especially if the change is on only one side of the body.  You notice changes of the skin, such as redness, black or  purple discoloration, swelling, or an ulcer.  You cannot move the affected limbs. Document Released: 07/13/2004 Document Revised: 01/08/2012 Document Reviewed: 07/13/2004 [employer] Patient Information 2015 [employer]. This information is not intended to replace advice given to you by your health care provider. Make sure you discuss any questions you have with your health care provider.  

## 2015-06-14 NOTE — Telephone Encounter (Signed)
°  Relation to HS:VEXO Call back [place]:  Reason for call: pt states that [facility] informed her that dr. Etter Sjogren needs to process the referral for pain management because she has more notes, pt would like for dr. Etter Sjogren to get the referral started for her.

## 2015-07-06 ENCOUNTER — Other Ambulatory Visit: Payer: Self-pay | Admitting: Family Medicine

## 2015-07-06 ENCOUNTER — Telehealth: Payer: Self-pay | Admitting: Family Medicine

## 2015-07-06 NOTE — Telephone Encounter (Signed)
Last seen 06/14/15 and filled 06/14/15 #60 NO UDS      KP

## 2015-07-06 NOTE — Telephone Encounter (Signed)
Relation to TU:YWXI Call back number:[phone]   Reason for call:  Patient requesting a refill oxyCODONE-acetaminophen (PERCOCET/ROXICET) 5-325 MG per tablet

## 2015-07-06 NOTE — Telephone Encounter (Signed)
Refill x1---  Need uds

## 2015-07-07 MED ORDER — OXYCODONE-ACETAMINOPHEN 5-325 MG PO TABS: 1.0000 | ORAL_TABLET | ORAL | Status: DC | PRN

## 2015-07-07 NOTE — Telephone Encounter (Signed)
VM left advising Rx ready for pick up.     KP 

## 2015-07-20 ENCOUNTER — Other Ambulatory Visit: Payer: BLUE CROSS/BLUE SHIELD

## 2015-07-22 ENCOUNTER — Other Ambulatory Visit: Payer: BLUE CROSS/BLUE SHIELD

## 2015-07-26 ENCOUNTER — Other Ambulatory Visit: Payer: Self-pay | Admitting: Family Medicine

## 2015-07-29 ENCOUNTER — Telehealth: Payer: Self-pay

## 2015-07-29 ENCOUNTER — Telehealth: Payer: Self-pay | Admitting: Family Medicine

## 2015-07-29 NOTE — Telephone Encounter (Signed)
Relation to pt: self Call back number: [phone]   Reason for call:  Patient requesting a refill oxyCODONE-acetaminophen (PERCOCET/ROXICET) 5-325 MG per tablet

## 2015-07-29 NOTE — Telephone Encounter (Signed)
Refill x1 

## 2015-07-29 NOTE — Telephone Encounter (Signed)
Pt is requesting refill on Oxycodone.  Last OV: 06/14/2015 Last Fill: 07/07/2015 #60 0RF UDS: 07/08/2015 Low risk  Please advise.

## 2015-07-30 ENCOUNTER — Telehealth: Payer: Self-pay | Admitting: Family Medicine

## 2015-07-30 ENCOUNTER — Other Ambulatory Visit: Payer: Self-pay

## 2015-07-30 MED ORDER — OXYCODONE-ACETAMINOPHEN 5-325 MG PO TABS: 1.0000 | ORAL_TABLET | ORAL | Status: DC | PRN

## 2015-07-30 NOTE — Telephone Encounter (Signed)
Rx printed for signature.

## 2015-07-30 NOTE — Telephone Encounter (Signed)
Per Dr. Etter Sjogren patient can have 1 refill. Rx printed for signature.

## 2015-07-30 NOTE — Telephone Encounter (Signed)
Pt husband picking up RX

## 2015-07-30 NOTE — Telephone Encounter (Signed)
VM left advising Rx ready for pick up.     KP 

## 2015-08-02 ENCOUNTER — Encounter: Payer: Self-pay | Admitting: Internal Medicine

## 2015-08-02 ENCOUNTER — Ambulatory Visit (INDEPENDENT_AMBULATORY_CARE_PROVIDER_SITE_OTHER): Payer: BLUE CROSS/BLUE SHIELD | Admitting: Internal Medicine

## 2015-08-02 VITALS — BP 138/80 | HR 80 | Ht 60.0 in | Wt 184.1 lb

## 2015-08-02 DIAGNOSIS — K766 Portal hypertension: Secondary | ICD-10-CM

## 2015-08-02 DIAGNOSIS — R932 Abnormal findings on diagnostic imaging of liver and biliary tract: Secondary | ICD-10-CM | POA: Diagnosis not present

## 2015-08-02 DIAGNOSIS — K7469 Other cirrhosis of liver: Secondary | ICD-10-CM

## 2015-08-02 DIAGNOSIS — K5901 Slow transit constipation: Secondary | ICD-10-CM

## 2015-08-02 DIAGNOSIS — R1314 Dysphagia, pharyngoesophageal phase: Secondary | ICD-10-CM

## 2015-08-02 NOTE — Patient Instructions (Signed)
Please follow up to discuss a procedure with Dr. Henrene Pastor after you have healed from your surgery
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
## 2015-08-05 ENCOUNTER — Telehealth: Payer: Self-pay | Admitting: Family Medicine

## 2015-08-05 NOTE — Telephone Encounter (Signed)
Left msg for pt to call and schedule 15 f/u appt with Dr. Etter Sjogren on 08/06/15. Pt surgery was cancelled today due to blood sugar elevation & Dr. Etter Sjogren wants to see her.

## 2015-08-12 ENCOUNTER — Encounter: Payer: Self-pay | Admitting: Family Medicine

## 2015-08-12 ENCOUNTER — Ambulatory Visit (INDEPENDENT_AMBULATORY_CARE_PROVIDER_SITE_OTHER): Payer: BLUE CROSS/BLUE SHIELD | Admitting: Family Medicine

## 2015-08-12 VITALS — BP 155/68 | HR 77 | Temp 98.4°F | Wt 185.8 lb

## 2015-08-12 DIAGNOSIS — E118 Type 2 diabetes mellitus with unspecified complications: Secondary | ICD-10-CM | POA: Diagnosis not present

## 2015-08-12 DIAGNOSIS — Z23 Encounter for immunization: Secondary | ICD-10-CM | POA: Diagnosis not present

## 2015-08-12 DIAGNOSIS — Z794 Long term (current) use of insulin: Secondary | ICD-10-CM

## 2015-08-12 DIAGNOSIS — I10 Essential (primary) hypertension: Secondary | ICD-10-CM

## 2015-08-12 DIAGNOSIS — E785 Hyperlipidemia, unspecified: Secondary | ICD-10-CM | POA: Diagnosis not present

## 2015-08-12 DIAGNOSIS — E119 Type 2 diabetes mellitus without complications: Secondary | ICD-10-CM

## 2015-08-12 LAB — COMPREHENSIVE METABOLIC PANEL
ALT: 16 U/L (ref 0–35)
AST: 19 U/L (ref 0–37)
Albumin: 3.8 g/dL (ref 3.5–5.2)
Alkaline Phosphatase: 90 U/L (ref 39–117)
BUN: 17 mg/dL (ref 6–23)
CO2: 30 mEq/L (ref 19–32)
Calcium: 9.4 mg/dL (ref 8.4–10.5)
Chloride: 104 mEq/L (ref 96–112)
Creatinine, Ser: 1.35 mg/dL — ABNORMAL HIGH (ref 0.40–1.20)
GFR: 42.58 mL/min — ABNORMAL LOW (ref >60.00)
Glucose, Bld: 263 mg/dL — ABNORMAL HIGH (ref 70–99)
Potassium: 4.5 mEq/L (ref 3.5–5.1)
Sodium: 139 mEq/L (ref 135–145)
TOTAL PROTEIN: 7.3 g/dL (ref 6.0–8.3)
Total Bilirubin: 0.6 mg/dL (ref 0.2–1.2)

## 2015-08-12 LAB — MICROALBUMIN / CREATININE URINE RATIO
Creatinine,U: 65.3 mg/dL
Microalb Creat Ratio: 4.7 mg/g (ref 0.0–30.0)
Microalb, Ur: 3.1 mg/dL — ABNORMAL HIGH (ref 0.0–1.9)

## 2015-08-12 LAB — POCT URINALYSIS DIPSTICK
BILIRUBIN UA: NEGATIVE
Glucose, UA: NEGATIVE
Ketones, UA: NEGATIVE
LEUKOCYTES UA: NEGATIVE
NITRITE UA: NEGATIVE
Protein, UA: NEGATIVE
RBC UA: NEGATIVE
Spec Grav, UA: 1.025
Urobilinogen, UA: NEGATIVE
pH, UA: 6

## 2015-08-12 LAB — LIPID PANEL
CHOL/HDL RATIO: 4
CHOLESTEROL: 163 mg/dL (ref 0–200)
HDL: 36.6 mg/dL — AB (ref >39.00)
LDL CALC: 96 mg/dL (ref 0–99)
NonHDL: 126.75
Triglycerides: 156 mg/dL — ABNORMAL HIGH (ref 0.0–149.0)
VLDL: 31.2 mg/dL (ref 0.0–40.0)

## 2015-08-12 LAB — HEMOGLOBIN A1C: Hgb A1c MFr Bld: 12.4 % — ABNORMAL HIGH (ref 4.6–6.5)

## 2015-08-12 LAB — HEPATIC FUNCTION PANEL
ALT: 16 U/L (ref 0–35)
AST: 19 U/L (ref 0–37)
Albumin: 3.8 g/dL (ref 3.5–5.2)
Alkaline Phosphatase: 90 U/L (ref 39–117)
BILIRUBIN DIRECT: 0.1 mg/dL (ref 0.0–0.3)
BILIRUBIN TOTAL: 0.6 mg/dL (ref 0.2–1.2)
Total Protein: 7.3 g/dL (ref 6.0–8.3)

## 2015-08-12 LAB — POCT CBG (FASTING - GLUCOSE)-MANUAL ENTRY: Glucose Fasting, POC: 257 mg/dL — AB (ref 70–99)

## 2015-08-12 NOTE — Assessment & Plan Note (Signed)
con't lipitor Check labs

## 2015-08-12 NOTE — Progress Notes (Signed)
[name], female    DOB: 02-05-1956  Age: 59 y.o. MRN: [id number]    Subjective:  Subjective HPI [name] [name] presents for f/u from where she was sent home without surgery because her sugers were high but pt said the machine was faulty--- it actually was low because they gave her insulin-----pt is her to have labs done.    Pt is going to go to Dr Sharol Given Monday and will not be going back to [facility].    Review of Systems  Constitutional: Negative for diaphoresis, appetite change, fatigue and unexpected weight change.  Eyes: Negative for pain, redness and visual disturbance.  Respiratory: Negative for cough, chest tightness, shortness of breath and wheezing.   Cardiovascular: Negative for chest pain, palpitations and leg swelling.  Endocrine: Negative for cold intolerance, heat intolerance, polydipsia, polyphagia and polyuria.  Genitourinary: Negative for dysuria, frequency and difficulty urinating.  Musculoskeletal: Positive for joint swelling and arthralgias. Negative for back pain and gait problem.  Neurological: Negative for dizziness, light-headedness, numbness and headaches.    History Past Medical History  Diagnosis Date  . Hypertension   . Fibromyalgia   . High cholesterol   . PTSD (post-traumatic stress disorder)   . Anginal pain ([place])   . Proximal humerus fracture 10/15/12    Left  . Diabetes mellitus     insulin dependent, adult onset  . Anemia   . Fatty liver   . Anxiety   . Acute MI [facility]) 2007    presented to ED & had cardiac cath- but found to have normal coronaries. Since that point in time her PCP cares f or cardiac needs. Dr. [name] - [facility]  . Exertional shortness of breath   . Osteoarthritis     hands, hips  . Chronic kidney disease     "had transplant when I was 15; doesn't bother me now" (03/20/2013)  . Cirrhosis of liver without mention of alcohol   . Renal insufficiency 05/05/2015  . GERD (gastroesophageal reflux  disease)   . 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   Her family history includes Cancer in her father; Colitis in her father; Crohn's disease in her father; Diabetes in her brother, father, and mother; Heart disease in her father; Heart disease (age of onset: 42) in her brother; Hypertension in her mother; Irritable bowel syndrome in her daughter; Kidney disease in her brother; Kidney failure in her brother; Leukemia in her father; Mental illness in her mother.She reports that she has never smoked. She has never used smokeless tobacco. She reports that she does not drink alcohol or use illicit drugs.  Current Outpatient Prescriptions on File Prior to Visit  Medication Sig Dispense Refill  . APIDRA SOLOSTAR 100 UNIT/ML Solostar Pen INJECT 10 UNITS SUBCUTANEOUSLY TWICE DAILY 15 pen 1  . atorvastatin (LIPITOR) 10 MG tablet Take 1 tablet (10 mg total) by mouth daily. 30 tablet 5  . Calcium Carbonate-Vitamin D (CALTRATE 600+D) 600-400 MG-UNIT per tablet Take 1 tablet by mouth daily.     . Cholecalciferol (VITAMIN D3) 5000 UNITS CAPS Take 1 capsule by mouth 2 (two) times daily.    [name] Kitchen  CINNAMON PO Take 1,000 mg by mouth daily.    . DULoxetine (CYMBALTA) 60 MG capsule Take 1 capsule (60 mg total) by mouth daily. 30 capsule 3  . ENSURE PLUS (ENSURE PLUS) LIQD Take 237 mLs by mouth.    . ferrous sulfate (SLOW FE) 160 (50 FE) MG TBCR SR tablet Take 160 mg by mouth daily.    . Flaxseed, Linseed, (FLAX SEED OIL) 1000 MG CAPS Take 1 capsule by mouth at bedtime.    .  fluticasone (FLONASE) 50 MCG/ACT nasal spray Place 2 sprays into both nostrils daily. 16 g 1  . hydrochlorothiazide (HYDRODIURIL) 25 MG tablet Take 1 tablet (25 mg total) by mouth daily. (Patient not taking: Reported on 06/14/2015) 30 tablet 11  . Insulin Glargine (LANTUS SOLOSTAR) 100 UNIT/ML Solostar Pen Inject 40 Units into the skin at bedtime. 15 mL 2  . lisinopril (PRINIVIL,ZESTRIL) 5 MG tablet Take 1 tablet (5 mg total) by mouth daily. 30 tablet 5  . LYRICA 150 MG capsule TAKE ONE CAPSULE BY MOUTH THREE TIMES DAILY AS NEEDED 90 capsule 0  . Multiple Vitamin (MULTIVITAMIN) tablet Take 1 tablet by mouth daily.     . ondansetron (ZOFRAN ODT) 8 MG disintegrating tablet Take 1 tablet (8 mg total) by mouth every 8 (eight) hours as needed for nausea or vomiting. 6 tablet 0  . oxyCODONE-acetaminophen (PERCOCET/ROXICET) 5-325 MG tablet Take 1-2 tablets by mouth every 4 (four) hours as needed for severe pain. 60 tablet 0  . polyethylene glycol (MIRALAX / GLYCOLAX) packet Take 17 g by mouth daily.      . vitamin C (ASCORBIC ACID) 500 MG tablet Take 500 mg by mouth daily.    . [DISCONTINUED] metFORMIN (GLUCOPHAGE) 1000 MG tablet Take 1,000 mg by mouth 2 (two) times daily with a meal.      . [DISCONTINUED] omeprazole (PRILOSEC) 20 MG capsule Take 20 mg by mouth daily.       No current facility-administered medications on file prior to visit.     Objective:  Objective Physical Exam  Constitutional: She is oriented to person, place, and time. She appears well-developed and well-nourished.  HENT:  Head: Normocephalic and atraumatic.  Eyes: Conjunctivae and EOM are normal.  Neck: Normal range of motion. Neck supple. No JVD present. Carotid bruit is not present. No thyromegaly present.  Cardiovascular: Normal rate, regular rhythm and normal heart sounds.   No murmur heard. Pulmonary/Chest: Effort normal and breath sounds normal. No respiratory distress. She has no wheezes. She has no rales. She exhibits  no tenderness.  Musculoskeletal: She exhibits no edema.  Neurological: She is alert and oriented to person, place, and time.  Psychiatric: She has a normal mood and affect.  Nursing note and vitals reviewed.  BP 155/68 mmHg  Pulse 77  Temp(Src) 98.4 F (36.9 C) (Oral)  Wt 185 lb 12.8 oz (84.278 kg)  SpO2 100% Wt Readings from Last 3 Encounters:  08/12/15 185 lb 12.8 oz (84.278 kg)  08/02/15 184 lb 2 oz (83.519 kg)  06/14/15 182 lb 12.8 oz (82.918 kg)     Lab Results  Component Value Date   WBC 6.9 03/26/2015   HGB 11.4* 03/26/2015   HCT 35.6* 03/26/2015   PLT 206 03/26/2015   GLUCOSE 263* 08/12/2015   CHOL 163 08/12/2015   TRIG 156.0* 08/12/2015   HDL 36.60* 08/12/2015   LDLDIRECT 115.8 01/23/2012   LDLCALC 96 08/12/2015   ALT 16 08/12/2015   ALT 16 08/12/2015   AST 19  08/12/2015   AST 19 08/12/2015   NA 139 08/12/2015   K 4.5 08/12/2015   CL 104 08/12/2015   CREATININE 1.35* 08/12/2015   BUN 17 08/12/2015   CO2 30 08/12/2015   TSH 3.057 04/04/2013   TSH 2.934 04/04/2013   INR 1.44 03/21/2013   HGBA1C 12.4* 08/12/2015   MICROALBUR 3.1* 08/12/2015    Dg Hand Complete Right  05/05/2015  CLINICAL DATA:  Right hand second finger pain for few months, no known injury EXAM: RIGHT HAND - COMPLETE 3+ VIEW COMPARISON:  None. FINDINGS: Three views of the right hand submitted. Degenerative changes are noted distal interphalangeal joints. No acute fracture or subluxation. Mild soft tissue swelling adjacent to distal interphalangeal joint second finger. IMPRESSION: No acute fracture or subluxation. Degenerative changes distal interphalangeal joints. Electronically Signed   By: Lahoma Crocker M.D.   On: 05/05/2015 15:37     Assessment & Plan:  Plan I am having Ms. Kortz-Reed maintain her Calcium Carbonate-Vitamin D, multivitamin, polyethylene glycol, hydrochlorothiazide, CINNAMON PO, Vitamin D3, Flax Seed Oil, atorvastatin, fluticasone, lisinopril, ondansetron, ENSURE PLUS, vitamin  C, Insulin Glargine, DULoxetine, ferrous sulfate, APIDRA SOLOSTAR, LYRICA, and oxyCODONE-acetaminophen.  No orders of the defined types were placed in this encounter.    Problem List Items Addressed This Visit    Type 2 diabetes mellitus treated with insulin ([place])    Pt bs controlled at home--- apparently the glucometer at hospital was not working correctly and her surgery was cancelled Check labs      Hyperlipidemia    con't lipitor Check labs      Essential hypertension    Stable con't lisinopril Check labs       Other Visit Diagnoses    Type 2 diabetes mellitus with complication, with long-term current use of insulin (HCC)    -  Primary    Relevant Orders    POCT CBG (Fasting - Glucose) (Completed)    Comp Met (CMET) (Completed)    Hemoglobin A1c (Completed)    Hepatic function panel (Completed)    Lipid panel (Completed)    Microalbumin / creatinine urine ratio (Completed)    POCT urinalysis dipstick (Completed)    Need for immunization against influenza        Relevant Orders    Flu Vaccine QUAD 36+ mos IM (Fluarix) (Completed)       Follow-up: Return in about 3 months (around 11/12/2015), or if symptoms worsen or fail to improve, for hypertension, hyperlipidemia, diabetes II.  Garnet Koyanagi, DO

## 2015-08-12 NOTE — Assessment & Plan Note (Signed)
Pt bs controlled at home--- apparently the glucometer at hospital was not working correctly and her surgery was cancelled Check labs

## 2015-08-12 NOTE — Progress Notes (Signed)
Pre visit review using our clinic review tool, if applicable. No additional management support is needed unless otherwise documented below in the visit note. 

## 2015-08-12 NOTE — Assessment & Plan Note (Signed)
Stable con't lisinopril Check labs

## 2015-08-12 NOTE — Patient Instructions (Signed)
Basic Carbohydrate Counting for Diabetes Mellitus Carbohydrate counting is a method for keeping track of the amount of carbohydrates you eat. Eating carbohydrates naturally increases the level of sugar (glucose) in your blood, so it is important for you to know the amount that is okay for you to have in every meal. Carbohydrate counting helps keep the level of glucose in your blood within normal limits. The amount of carbohydrates allowed is different for every person. A dietitian can help you calculate the amount that is right for you. Once you know the amount of carbohydrates you can have, you can count the carbohydrates in the foods you want to eat. Carbohydrates are found in the following foods:  Grains, such as breads and cereals.  Dried beans and soy products.  Starchy vegetables, such as potatoes, peas, and corn.  Fruit and fruit juices.  Milk and yogurt.  Sweets and snack foods, such as cake, cookies, candy, chips, soft drinks, and fruit drinks. CARBOHYDRATE COUNTING There are two ways to count the carbohydrates in your food. You can use either of the methods or a combination of both. Reading the "Nutrition Facts" on Packaged Food The "Nutrition Facts" is an area that is included on the labels of almost all packaged food and beverages in the [place]. It includes the serving size of that food or beverage and information about the nutrients in each serving of the food, including the grams (g) of carbohydrate per serving.  Decide the number of servings of this food or beverage that you will be able to eat or drink. Multiply that number of servings by the number of grams of carbohydrate that is listed on the label for that serving. The total will be the amount of carbohydrates you will be having when you eat or drink this food or beverage. Learning Standard Serving Sizes of Food When you eat food that is not packaged or does not include "Nutrition Facts" on the label, you need to  measure the servings in order to count the amount of carbohydrates.A serving of most carbohydrate-rich foods contains about 15 g of carbohydrates. The following list includes serving sizes of carbohydrate-rich foods that provide 15 g ofcarbohydrate per serving:   1 slice of bread (1 oz) or 1 six-inch tortilla.    of a hamburger bun or English muffin.  4-6 crackers.   cup unsweetened dry cereal.    cup hot cereal.   cup rice or pasta.    cup mashed potatoes or  of a large baked potato.  1 cup fresh fruit or one small piece of fruit.    cup canned or frozen fruit or fruit juice.  1 cup milk.   cup plain fat-free yogurt or yogurt sweetened with artificial sweeteners.   cup cooked dried beans or starchy vegetable, such as peas, corn, or potatoes.  Decide the number of standard-size servings that you will eat. Multiply that number of servings by 15 (the grams of carbohydrates in that serving). For example, if you eat 2 cups of strawberries, you will have eaten 2 servings and 30 g of carbohydrates (2 servings x 15 g = 30 g). For foods such as soups and casseroles, in which more than one food is mixed in, you will need to count the carbohydrates in each food that is included. EXAMPLE OF CARBOHYDRATE COUNTING Sample Dinner  3 oz chicken breast.   cup of brown rice.   cup of corn.  1 cup milk.   1 cup strawberries with   sugar-free whipped topping.  Carbohydrate Calculation Step 1: Identify the foods that contain carbohydrates:   Rice.   Corn.   Milk.   Strawberries. Step 2:Calculate the number of servings eaten of each:   2 servings of rice.   1 serving of corn.   1 serving of milk.   1 serving of strawberries. Step 3: Multiply each of those number of servings by 15 g:   2 servings of rice x 15 g = 30 g.   1 serving of corn x 15 g = 15 g.   1 serving of milk x 15 g = 15 g.   1 serving of strawberries x 15 g = 15 g. Step 4: Add  together all of the amounts to find the total grams of carbohydrates eaten: 30 g + 15 g + 15 g + 15 g = 75 g.   This information is not intended to replace advice given to you by your health care provider. Make sure you discuss any questions you have with your health care provider.   Document Released: 10/16/2005 Document Revised: 11/06/2014 Document Reviewed: 09/12/2013 Elsevier Interactive Patient Education 2016 Elsevier Inc.  

## 2015-08-16 ENCOUNTER — Other Ambulatory Visit: Payer: Self-pay | Admitting: Family Medicine

## 2015-08-16 DIAGNOSIS — IMO0002 Reserved for concepts with insufficient information to code with codable children: Secondary | ICD-10-CM

## 2015-08-16 DIAGNOSIS — E1165 Type 2 diabetes mellitus with hyperglycemia: Principal | ICD-10-CM

## 2015-08-16 DIAGNOSIS — E1151 Type 2 diabetes mellitus with diabetic peripheral angiopathy without gangrene: Secondary | ICD-10-CM

## 2015-08-20 ENCOUNTER — Other Ambulatory Visit: Payer: Self-pay

## 2015-08-20 MED ORDER — FENOFIBRATE 160 MG PO TABS: 160.0000 mg | ORAL_TABLET | Freq: Every day | ORAL | Status: DC

## 2015-08-25 ENCOUNTER — Telehealth: Payer: Self-pay | Admitting: Family Medicine

## 2015-08-25 NOTE — Telephone Encounter (Signed)
Relation to PY:KDXI Call back number:[phone]  Reason for call:   Patient requesting a refill oxyCODONE-acetaminophen (PERCOCET/ROXICET) 5-325 MG tablet

## 2015-08-26 MED ORDER — OXYCODONE-ACETAMINOPHEN 5-325 MG PO TABS: 1.0000 | ORAL_TABLET | ORAL | Status: DC | PRN

## 2015-08-26 NOTE — Telephone Encounter (Signed)
Refill x1 

## 2015-08-26 NOTE — Telephone Encounter (Signed)
Message left advising Rx ready for pick up.     KP

## 2015-08-26 NOTE — Telephone Encounter (Signed)
Last seen 08/12/15 and filled 07/30/15 #60 UDS 07/08/15 Low risk   Please advise    KP

## 2015-09-06 ENCOUNTER — Ambulatory Visit: Payer: BLUE CROSS/BLUE SHIELD | Admitting: Internal Medicine

## 2015-09-06 DIAGNOSIS — Z0289 Encounter for other administrative examinations: Secondary | ICD-10-CM

## 2015-09-09 ENCOUNTER — Telehealth: Payer: Self-pay | Admitting: Family Medicine

## 2015-09-09 MED ORDER — PREGABALIN 150 MG PO CAPS: 150.0000 mg | ORAL_CAPSULE | Freq: Three times a day (TID) | ORAL | Status: DC | PRN

## 2015-09-09 NOTE — Telephone Encounter (Signed)
Caller name: Self   Can be reached: [phone]  Pharmacy:  [facility] [address] [place], [place] [phone] (Phone) [phone] (Fax)        Reason for call: Request refill on LYRICA 150 MG capsule [[id number]]

## 2015-09-09 NOTE — Telephone Encounter (Signed)
Faxed.   KP 

## 2015-09-15 ENCOUNTER — Telehealth: Payer: Self-pay | Admitting: Family Medicine

## 2015-09-15 NOTE — Telephone Encounter (Signed)
Pt calling for refill on oxycodone. She has 3 left. Takes 3-4/day. [place] with refill tomorrow. Please call [phone] when ready to pick up.

## 2015-09-16 MED ORDER — OXYCODONE-ACETAMINOPHEN 5-325 MG PO TABS
1.0000 | ORAL_TABLET | ORAL | Status: DC | PRN
Start: 2015-09-16 — End: 2015-10-05

## 2015-09-16 NOTE — Telephone Encounter (Signed)
Refill x1 

## 2015-09-16 NOTE — Telephone Encounter (Signed)
VM left advising Rx is ready for pick up.      KP

## 2015-09-16 NOTE — Telephone Encounter (Signed)
Last seen 08/12/15 and 08/26/15 #60 UDS 07/08/15 low risk  Please advise    KP

## 2015-10-04 ENCOUNTER — Telehealth: Payer: Self-pay | Admitting: Family Medicine

## 2015-10-04 NOTE — Telephone Encounter (Signed)
Caller name: Self   Can be reached: [phone]  Pharmacy: [facility] [address] [place], [place] [phone] (Phone) [phone] (Fax)         Reason for call: Need refill on oxyCODONE-acetaminophen (PERCOCET/ROXICET) 5-325 MG tablet [[id number]]

## 2015-10-05 MED ORDER — OXYCODONE-ACETAMINOPHEN 5-325 MG PO TABS: 1.0000 | ORAL_TABLET | ORAL | Status: DC | PRN

## 2015-10-05 NOTE — Telephone Encounter (Signed)
Last filled: 09/16/15 Amt: 60, 0 Last OV:  08/12/15 Contract on file UDS: LOW Risk  Please advise.

## 2015-10-05 NOTE — Telephone Encounter (Signed)
Pt called back this morning and was notified that Rx was ready for pick up.

## 2015-10-05 NOTE — Telephone Encounter (Signed)
See Rx 

## 2015-10-05 NOTE — Telephone Encounter (Signed)
Rx placed at front desk for pick up and left message for pt to return my call.

## 2015-10-19 ENCOUNTER — Telehealth: Payer: Self-pay | Admitting: Family Medicine

## 2015-10-19 NOTE — Telephone Encounter (Signed)
Last seen 10/13/126 and filled 10/05/15 #60 UDS 07/08/15 low risk   Please advise     KP

## 2015-10-19 NOTE — Telephone Encounter (Signed)
Pt calling for refill on oxycodone. She is taking 3-5 every day. She has 7 left. Please call at [phone].

## 2015-10-19 NOTE — Telephone Encounter (Signed)
Message left to call the office.    KP 

## 2015-10-19 NOTE — Telephone Encounter (Signed)
Really too early--- why is she needing so much more?

## 2015-10-21 MED ORDER — OXYCODONE-ACETAMINOPHEN 5-325 MG PO TABS: 1.0000 | ORAL_TABLET | ORAL | Status: DC | PRN

## 2015-10-21 NOTE — Telephone Encounter (Signed)
Refill #90

## 2015-10-21 NOTE — Telephone Encounter (Signed)
Spoke with patient and she verbalized understanding, she said her prescription says to take 1-2 every 4 hours as needed, she said she is having to take 2 every 4 hours, she does not have a pain management apt yet and she needs a refill before we are out for the [place]. Please advise     KP

## 2015-10-21 NOTE — Telephone Encounter (Signed)
Called patient and left message that prescription is ready for pick up in the office. KP

## 2015-10-26 ENCOUNTER — Ambulatory Visit (INDEPENDENT_AMBULATORY_CARE_PROVIDER_SITE_OTHER): Payer: BLUE CROSS/BLUE SHIELD | Admitting: Family Medicine

## 2015-10-26 ENCOUNTER — Encounter: Payer: Self-pay | Admitting: Family Medicine

## 2015-10-26 ENCOUNTER — Ambulatory Visit (HOSPITAL_BASED_OUTPATIENT_CLINIC_OR_DEPARTMENT_OTHER)
Admission: RE | Admit: 2015-10-26 | Discharge: 2015-10-26 | Disposition: A | Discharge status: Home / Self Care | Payer: BLUE CROSS/BLUE SHIELD | Source: Ambulatory Visit | Attending: Family Medicine | Admitting: Family Medicine

## 2015-10-26 VITALS — BP 118/70 | HR 72 | Temp 97.9°F | Ht 62.0 in | Wt 189.6 lb

## 2015-10-26 DIAGNOSIS — M7732 Calcaneal spur, left foot: Secondary | ICD-10-CM | POA: Insufficient documentation

## 2015-10-26 DIAGNOSIS — M7989 Other specified soft tissue disorders: Secondary | ICD-10-CM | POA: Insufficient documentation

## 2015-10-26 DIAGNOSIS — B9562 Methicillin resistant Staphylococcus aureus infection as the cause of diseases classified elsewhere: Secondary | ICD-10-CM | POA: Diagnosis not present

## 2015-10-26 DIAGNOSIS — L03116 Cellulitis of left lower limb: Secondary | ICD-10-CM | POA: Diagnosis not present

## 2015-10-26 MED ORDER — SULFAMETHOXAZOLE-TRIMETHOPRIM 800-160 MG PO TABS: 1.0000 | ORAL_TABLET | Freq: Two times a day (BID) | ORAL | Status: DC

## 2015-10-26 NOTE — Progress Notes (Signed)
Pre visit review using our clinic review tool, if applicable. No additional management support is needed unless otherwise documented below in the visit note. 

## 2015-10-26 NOTE — Patient Instructions (Signed)
MRSA Infection, Adult MRSA stands for methicillin-resistant Staphylococcus aureus. This type of infection is caused by Staphylococcus aureus bacteria that are no longer affected by the medicines used to kill them (drug resistant). Staphylococcus (staph) bacteria are normally found on the skin or in the nose of healthy people. In most cases, these bacteria do not cause infection. But if these resistant bacteria enter your body through a cut or sore, they can cause a serious infection on your skin or in other parts of your body. There is a slight chance that the staph on your skin or in your nose is MRSA. There are two types of MRSA infections:  Hospital-acquired MRSA is bacteria that you get in the hospital.  Community-acquired MRSA is bacteria that you get somewhere other than in a hospital. RISK FACTORS Hospital-acquired MRSA is more common. You could be at risk for this infection if you are in the hospital and you:  Have surgery or a procedure.  Have an IV access or a catheter tube placed in your body.  Have weak resistance to germs (weakened immune system).  Are elderly.  Are on kidney dialysis. You could be at risk for community-acquired MRSA if you have a break in your skin and come into contact with MRSA. This may happen if you:  Play sports where there is skin-to-skin contact.  Live in a crowded setting, like a dormitory or a military barracks.  Share towels, razors, or sports equipment with other people. SYMPTOMS  Symptoms of hospital-acquired MRSA depend on where MRSA has spread. Symptoms may include:  Wound infection.  Skin infection.  Rash.  Pneumonia.  Fever and chills.  Difficulty breathing.  Chest pain. Community-acquired MRSA is most likely to start as a scratch or cut that becomes infected. Symptoms may include:  A pus-filled pimple.  A boil on your skin.  Pus draining from your skin.  A sore (abscess) under your skin or somewhere in your  body.  Fever with or without chills. DIAGNOSIS  The diagnosis of MRSA is made by taking a sample from an infected area and sending it to a lab for testing. A lab technician can grow (culture) MRSA and check it under a microscope. The cultured MRSA can be tested to see which type of antibiotic medicine will work to treat it. Newer tests can identify MRSA more quickly by testing bacteria samples for MRSA genes. Your health care provider can diagnose MRSA using samples from:   Cuts or wounds in infected areas.  Nasal swabs.  Saliva or cough specimens from deep in the lungs (sputum).  Urine.  Blood. You may also have:  Imaging studies (such as X-ray or MRI) to check if the infection has spread to the lungs, bones, or joints.  A culture and sensitivity test of blood or fluids from inside the joints. TREATMENT  Treatment depends on how severe, deep, or extensive the infection is. Very bad infections may require a hospital stay.  Some skin infections, such as a small boil or sore (abscess), may be treated by draining pus from the site of the infection.  More extensive surgery to drain pus may be necessary for deeper or more widespread soft tissue infections.  You may then have to take antibiotic medicine given by mouth or through a vein. You may start antibiotic treatment right away or after testing can be done to see what antibiotic medicine should be used. HOME CARE INSTRUCTIONS   Take your antibiotics as directed by your health care provider.   Take the medicine as prescribed until it is finished.  Avoid close contact with those around you as much as possible. Do not use towels, razors, toothbrushes, bedding, or other items that will be used by others.  Wash your hands frequently for 15 seconds with soap and water. Dry your hands with a clean or disposable towel.  When you are not able to wash your hands, use hand sanitizer that is more than 60 percent alcohol.  Wash towels, sheets,  or clothes in the washing machine with detergent and hot water. Dry them in a hot dryer.  Follow your health care provider's instructions for wound care. Wash your hands before and after changing your bandages.  Always shower after exercising.  Keep all cuts and scrapes clean and covered with a bandage.  Be sure to tell all your health care providers that you have MRSA so they are aware of your infection. SEEK MEDICAL CARE IF:  You have a cut, scrape, pimple, or boil that becomes red, swollen, or painful or has pus in it.  You have pus draining from your skin.  You have an abscess under your skin or somewhere in your body. SEEK IMMEDIATE MEDICAL CARE IF:   You have symptoms of a skin infection with a fever or chills.  You have trouble breathing.  You have chest pain.  You have a skin wound and you become nauseous or start vomiting. MAKE SURE YOU:  Understand these instructions.  Will watch your condition.  Will get help right away if you are not doing well or get worse.   This information is not intended to replace advice given to you by your health care provider. Make sure you discuss any questions you have with your health care provider.   Document Released: 10/16/2005 Document Revised: 03/02/2015 Document Reviewed: 08/08/2013 Elsevier Interactive Patient Education 2016 [employer] Inc.  

## 2015-10-26 NOTE — Progress Notes (Signed)
[name], female    DOB: 03/27/1956  Age: 59 y.o. MRN: [id number]    Subjective:  Subjective HPI [name] [name] presents with c/o L foot swelling and mrsa.  She has a hx mrsa r foot and know believes it is in her L foot.  No injury.  She first noticed symptoms dec 10.     Review of Systems  Constitutional: Negative for diaphoresis, appetite change, fatigue and unexpected weight change.  Eyes: Negative for pain, redness and visual disturbance.  Respiratory: Negative for cough, chest tightness, shortness of breath and wheezing.   Cardiovascular: Negative for chest pain, palpitations and leg swelling.  Endocrine: Negative for cold intolerance, heat intolerance, polydipsia, polyphagia and polyuria.  Genitourinary: Negative for dysuria, frequency and difficulty urinating.  Skin: Positive for color change and wound.  Neurological: Negative for dizziness, light-headedness, numbness and headaches.    History Past Medical History  Diagnosis Date  . Hypertension   . Fibromyalgia   . High cholesterol   . PTSD (post-traumatic stress disorder)   . Anginal pain ([place])   . Proximal humerus fracture 10/15/12    Left  . Diabetes mellitus     insulin dependent, adult onset  . Anemia   . Fatty liver   . Anxiety   . Acute MI [facility]) 2007    presented to ED & had cardiac cath- but found to have normal coronaries. Since that point in time her PCP cares f or cardiac needs. Dr. [name] - [facility]  . Exertional shortness of breath   . Osteoarthritis     hands, hips  . Chronic kidney disease     "had transplant when I was 15; doesn't bother me now" (03/20/2013)  . Cirrhosis of liver without mention of alcohol   . Renal insufficiency 05/05/2015  . GERD (gastroesophageal reflux disease)   . 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   Her family history includes Cancer in her father; Colitis in her father; Crohn's disease in her father; Diabetes in her brother, father, and mother; Heart disease in her father; Heart disease (age of onset: 6) in her brother; Hypertension in her mother; Irritable bowel syndrome in her daughter; Kidney disease in her brother; Kidney failure in her brother; Leukemia in her father; Mental illness in her mother.She reports that she has never smoked. She has never used smokeless tobacco. She reports that she does not drink alcohol or use illicit drugs.  Current Outpatient Prescriptions on File Prior to Visit  Medication Sig Dispense Refill  . APIDRA SOLOSTAR 100 UNIT/ML Solostar Pen INJECT 10 UNITS SUBCUTANEOUSLY TWICE DAILY 15 pen 1  . atorvastatin (LIPITOR) 10 MG tablet Take 1 tablet (10 mg total) by mouth daily. 30 tablet 5  . Calcium Carbonate-Vitamin D (CALTRATE 600+D) 600-400 MG-UNIT per tablet Take 1 tablet by mouth daily.     . Cholecalciferol (VITAMIN D3) 5000 UNITS CAPS Take 1 capsule by mouth 2 (two) times daily.    [name] Kitchen CINNAMON PO Take 1,000 mg by mouth daily.    . DULoxetine (CYMBALTA) 60 MG capsule Take 1 capsule (60 mg total) by mouth daily. 30 capsule 3  .  ENSURE PLUS (ENSURE PLUS) LIQD Take 237 mLs by mouth.    . fenofibrate 160 MG tablet Take 1 tablet (160 mg total) by mouth daily. 30 tablet 2  . ferrous sulfate (SLOW FE) 160 (50 FE) MG TBCR SR tablet Take 160 mg by mouth daily.    . Flaxseed, Linseed, (FLAX SEED OIL) 1000 MG CAPS Take 1 capsule by mouth at bedtime.    . fluticasone (FLONASE) 50 MCG/ACT nasal spray Place 2 sprays into both nostrils daily. 16 g 1  . hydrochlorothiazide  (HYDRODIURIL) 25 MG tablet Take 1 tablet (25 mg total) by mouth daily. 30 tablet 11  . Insulin Glargine (LANTUS SOLOSTAR) 100 UNIT/ML Solostar Pen Inject 40 Units into the skin at bedtime. 15 mL 2  . lisinopril (PRINIVIL,ZESTRIL) 5 MG tablet Take 1 tablet (5 mg total) by mouth daily. 30 tablet 5  . Multiple Vitamin (MULTIVITAMIN) tablet Take 1 tablet by mouth daily.     . ondansetron (ZOFRAN ODT) 8 MG disintegrating tablet Take 1 tablet (8 mg total) by mouth every 8 (eight) hours as needed for nausea or vomiting. 6 tablet 0  . oxyCODONE-acetaminophen (PERCOCET/ROXICET) 5-325 MG tablet Take 1-2 tablets by mouth every 4 (four) hours as needed for severe pain. 90 tablet 0  . polyethylene glycol (MIRALAX / GLYCOLAX) packet Take 17 g by mouth daily.      . pregabalin (LYRICA) 150 MG capsule Take 1 capsule (150 mg total) by mouth 3 (three) times daily as needed. 90 capsule 5  . vitamin C (ASCORBIC ACID) 500 MG tablet Take 500 mg by mouth daily.    . [DISCONTINUED] metFORMIN (GLUCOPHAGE) 1000 MG tablet Take 1,000 mg by mouth 2 (two) times daily with a meal.      . [DISCONTINUED] omeprazole (PRILOSEC) 20 MG capsule Take 20 mg by mouth daily.       No current facility-administered medications on file prior to visit.     Objective:  Objective Physical Exam  Constitutional: She is oriented to person, place, and time. She appears well-developed and well-nourished.  HENT:  Head: Normocephalic and atraumatic.  Eyes: Conjunctivae and EOM are normal.  Neck: Normal range of motion. Neck supple. No JVD present. Carotid bruit is not present. No thyromegaly present.  Cardiovascular: Normal rate, regular rhythm and normal heart sounds.   No murmur heard. Pulmonary/Chest: Effort normal and breath sounds normal. No respiratory distress. She has no wheezes. She has no rales. She exhibits no tenderness.  Musculoskeletal: She exhibits no edema.  Neurological: She is alert and oriented to person, place, and time.    Skin:  L foot 5th toe---  Swollen and errythema + drainage-- culture done abx ointment and guaze placed to keep covered  Psychiatric: She has a normal mood and affect.  Nursing note and vitals reviewed.  BP 118/70 mmHg  Pulse 72  Temp(Src) 97.9 F (36.6 C) (Oral)  Ht 5' 2"  (1.575 m)  Wt 189 lb 9.6 oz (86.002 kg)  BMI 34.67 kg/m2  SpO2 95% Wt Readings from Last 3 Encounters:  10/26/15 189 lb 9.6 oz (86.002 kg)  08/12/15 185 lb 12.8 oz (84.278 kg)  08/02/15 184 lb 2 oz (83.519 kg)     Lab Results  Component Value Date   WBC 6.9 03/26/2015   HGB 11.4* 03/26/2015   HCT 35.6* 03/26/2015   PLT 206 03/26/2015   GLUCOSE 263* 08/12/2015   CHOL 163 08/12/2015   TRIG 156.0* 08/12/2015   HDL 36.60* 08/12/2015   LDLDIRECT 115.8  01/23/2012   LDLCALC 96 08/12/2015   ALT 16 08/12/2015   ALT 16 08/12/2015   AST 19 08/12/2015   AST 19 08/12/2015   NA 139 08/12/2015   K 4.5 08/12/2015   CL 104 08/12/2015   CREATININE 1.35* 08/12/2015   BUN 17 08/12/2015   CO2 30 08/12/2015   TSH 3.057 04/04/2013   TSH 2.934 04/04/2013   INR 1.44 03/21/2013   HGBA1C 12.4* 08/12/2015   MICROALBUR 3.1* 08/12/2015    Dg Hand Complete Right  05/05/2015  CLINICAL DATA:  Right hand second finger pain for few months, no known injury EXAM: RIGHT HAND - COMPLETE 3+ VIEW COMPARISON:  None. FINDINGS: Three views of the right hand submitted. Degenerative changes are noted distal interphalangeal joints. No acute fracture or subluxation. Mild soft tissue swelling adjacent to distal interphalangeal joint second finger. IMPRESSION: No acute fracture or subluxation. Degenerative changes distal interphalangeal joints. Electronically Signed   By: Lahoma Crocker M.D.   On: 05/05/2015 15:37     Assessment & Plan:  Plan I am having Ms. Bremner-Reed start on sulfamethoxazole-trimethoprim. I am also having her maintain her Calcium Carbonate-Vitamin D, multivitamin, polyethylene glycol, hydrochlorothiazide, CINNAMON PO,  Vitamin D3, Flax Seed Oil, atorvastatin, fluticasone, lisinopril, ondansetron, ENSURE PLUS, vitamin C, Insulin Glargine, DULoxetine, ferrous sulfate, APIDRA SOLOSTAR, fenofibrate, pregabalin, and oxyCODONE-acetaminophen.  Meds ordered this encounter  Medications  . sulfamethoxazole-trimethoprim (BACTRIM DS,SEPTRA DS) 800-160 MG tablet    Sig: Take 1 tablet by mouth 2 (two) times daily.    Dispense:  14 tablet    Refill:  0    Problem List Items Addressed This Visit    None    Visit Diagnoses    MRSA cellulitis of left foot    -  Primary    Relevant Medications    sulfamethoxazole-trimethoprim (BACTRIM DS,SEPTRA DS) 800-160 MG tablet    Other Relevant Orders    DG Foot Complete Left    Wound culture       Follow-up: Return if symptoms worsen or fail to improve.  Garnet Koyanagi, DO

## 2015-10-29 LAB — WOUND CULTURE
GRAM STAIN: NONE SEEN
Gram Stain: NONE SEEN
Gram Stain: NONE SEEN

## 2015-11-04 ENCOUNTER — Other Ambulatory Visit: Payer: Self-pay | Admitting: Family Medicine

## 2015-11-04 MED ORDER — OXYCODONE-ACETAMINOPHEN 5-325 MG PO TABS: 1.0000 | ORAL_TABLET | ORAL | Status: DC | PRN

## 2015-11-04 NOTE — Telephone Encounter (Signed)
Patient aware the other 40 is ready for pick up.     KP

## 2015-11-04 NOTE — Telephone Encounter (Signed)
Caller name: Self   Can be reached: [phone]    Reason for call: Request refill on   oxyCODONE-acetaminophen (PERCOCET/ROXICET) 5-325 MG tablet [[id number]]      Patient states that she only received 50 tablets on 12/22 instead of the 90 tablets that was on the rx.

## 2015-11-04 NOTE — Telephone Encounter (Signed)
Ok to refill 

## 2015-11-04 NOTE — Telephone Encounter (Signed)
Please advise if it ok to refill.     KP

## 2015-11-04 NOTE — Telephone Encounter (Signed)
Pharmacy called to explain that on 12/22 they only had 50 tablets available, therefore she only received 50. They need another rx to refill it for her.

## 2015-11-16 ENCOUNTER — Telehealth: Payer: Self-pay | Admitting: Family Medicine

## 2015-11-16 MED ORDER — OXYCODONE-ACETAMINOPHEN 5-325 MG PO TABS: 1.0000 | ORAL_TABLET | ORAL | Status: DC | PRN

## 2015-11-16 NOTE — Telephone Encounter (Signed)
Last seen 10/26/15 and filled 11/03/14 #40 UDS 07/08/15 low risk   Please advise    KP

## 2015-11-16 NOTE — Telephone Encounter (Signed)
Pt called for refill on oxycodone. She has 5 left. She is taking 3/day. She said she has taken 1 extra for her foot and Dr. Etter Sjogren is aware.

## 2015-11-16 NOTE — Telephone Encounter (Signed)
Message left advising Rx ready for pick up.     KP

## 2015-11-16 NOTE — Telephone Encounter (Signed)
Ok to refill 

## 2015-11-26 ENCOUNTER — Telehealth: Payer: Self-pay | Admitting: Family Medicine

## 2015-11-26 MED ORDER — OXYCODONE-ACETAMINOPHEN 5-325 MG PO TABS: 1.0000 | ORAL_TABLET | ORAL | Status: DC | PRN

## 2015-11-26 NOTE — Telephone Encounter (Signed)
Ok to refill #60

## 2015-11-26 NOTE — Telephone Encounter (Signed)
Last seen 10/26/15 and filled 11/16/15 #40 stated she normally gets 60. Please advise    KP

## 2015-11-26 NOTE — Telephone Encounter (Signed)
Pt called for refill on oxycodone. She has 4 left and takes 3-4/day. She said that she doesn't understand why she gets 40 now and used to get 57. Pt req call when RX is ready at [phone].

## 2015-11-26 NOTE — Telephone Encounter (Signed)
Patient aware Rx will be ready for pick up on Monday.   KP

## 2015-11-29 ENCOUNTER — Telehealth: Payer: Self-pay | Admitting: *Deleted

## 2015-11-29 NOTE — Telephone Encounter (Signed)
Received [3] fax requesting physician to sign off on [3] Orthosis Orders [back, shoulder, wrist]; LMOM with contact name and number for return call RE: if patient did and/or did not request these orders, and also, that if she did that she will need to schedule an appointment for provider to be able to complete the orders/SLS 01/30

## 2015-11-30 ENCOUNTER — Other Ambulatory Visit: Payer: Self-pay | Admitting: Family Medicine

## 2015-12-10 ENCOUNTER — Telehealth: Payer: Self-pay | Admitting: Behavioral Health

## 2015-12-10 NOTE — Telephone Encounter (Signed)
The following gaps in care were assessed: A1c > 8% BP (118/70, 10/26/15) Colon (patient declined per health maintenance)  Attempted to follow-up with the patient regarding the last A1c. Per lab note 08/12/15, the patient had an appointment scheduled with Endocrinologist, Dr. Cruzita Lederer. Unable to reach the patient at this time. Left a message for a call back.

## 2015-12-22 ENCOUNTER — Telehealth: Payer: Self-pay | Admitting: Family Medicine

## 2015-12-22 NOTE — Telephone Encounter (Signed)
Caller name: Self   Can be reached: [phone]    Reason for call: oxyCODONE-acetaminophen (PERCOCET/ROXICET) 5-325 MG tablet [[id number]]

## 2015-12-22 NOTE — Telephone Encounter (Signed)
Last filled:  11/26/15 Amt: 60, 0 Last OV: 10/26/15 [facility] contract signed UDS: Low risk  Please advise.

## 2015-12-23 MED ORDER — OXYCODONE-ACETAMINOPHEN 5-325 MG PO TABS: 1.0000 | ORAL_TABLET | ORAL | Status: DC | PRN

## 2015-12-23 NOTE — Telephone Encounter (Signed)
Refill x1 

## 2015-12-23 NOTE — Telephone Encounter (Signed)
VM left advising Rx can be picked up after 3 pm today.      KP

## 2016-01-03 ENCOUNTER — Encounter: Payer: Self-pay | Admitting: Family Medicine

## 2016-01-03 ENCOUNTER — Ambulatory Visit (INDEPENDENT_AMBULATORY_CARE_PROVIDER_SITE_OTHER): Payer: BLUE CROSS/BLUE SHIELD | Admitting: Family Medicine

## 2016-01-03 VITALS — BP 122/72 | HR 75 | Temp 98.5°F | Ht 62.0 in | Wt 193.0 lb

## 2016-01-03 DIAGNOSIS — L03116 Cellulitis of left lower limb: Secondary | ICD-10-CM | POA: Diagnosis not present

## 2016-01-03 DIAGNOSIS — J302 Other seasonal allergic rhinitis: Secondary | ICD-10-CM | POA: Diagnosis not present

## 2016-01-03 DIAGNOSIS — B9562 Methicillin resistant Staphylococcus aureus infection as the cause of diseases classified elsewhere: Secondary | ICD-10-CM

## 2016-01-03 DIAGNOSIS — E1165 Type 2 diabetes mellitus with hyperglycemia: Secondary | ICD-10-CM

## 2016-01-03 DIAGNOSIS — E11622 Type 2 diabetes mellitus with other skin ulcer: Secondary | ICD-10-CM | POA: Diagnosis not present

## 2016-01-03 DIAGNOSIS — IMO0002 Reserved for concepts with insufficient information to code with codable children: Secondary | ICD-10-CM

## 2016-01-03 MED ORDER — SULFAMETHOXAZOLE-TRIMETHOPRIM 800-160 MG PO TABS: 1.0000 | ORAL_TABLET | Freq: Two times a day (BID) | ORAL | Status: DC

## 2016-01-03 MED ORDER — LEVOCETIRIZINE DIHYDROCHLORIDE 5 MG PO TABS: 5.0000 mg | ORAL_TABLET | Freq: Every evening | ORAL | Status: DC

## 2016-01-03 NOTE — Progress Notes (Signed)
[name], female    DOB: 02-26-1956  Age: 60 y.o. MRN: [id number]    Subjective:  Subjective HPI [name] [name] presents for rash on leg that is itchy and worsening.   No otc. She is also c/o allergy sympoms.  + sneezing   Review of Systems  Constitutional: Negative for diaphoresis, appetite change, fatigue and unexpected weight change.  Eyes: Negative for pain, redness and visual disturbance.  Respiratory: Negative for cough, chest tightness, shortness of breath and wheezing.   Cardiovascular: Negative for chest pain, palpitations and leg swelling.  Endocrine: Negative for cold intolerance, heat intolerance, polydipsia, polyphagia and polyuria.  Genitourinary: Negative for dysuria, frequency and difficulty urinating.  Neurological: Negative for dizziness, light-headedness, numbness and headaches.    History Past Medical History  Diagnosis Date  . Hypertension   . Fibromyalgia   . High cholesterol   . PTSD (post-traumatic stress disorder)   . Anginal pain ([place])   . Proximal humerus fracture 10/15/12    Left  . Diabetes mellitus     insulin dependent, adult onset  . Anemia   . Fatty liver   . Anxiety   . Acute MI [facility]) 2007    presented to ED & had cardiac cath- but found to have normal coronaries. Since that point in time her PCP cares f or cardiac needs. Dr. [name] - [facility]  . Exertional shortness of breath   . Osteoarthritis     hands, hips  . Chronic kidney disease     "had transplant when I was 15; doesn't bother me now" (03/20/2013)  . Cirrhosis of liver without mention of alcohol   . Renal insufficiency 05/05/2015  . GERD (gastroesophageal reflux disease)   . 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   Her family history includes Cancer in her father; Colitis in her father; Crohn's disease in her father; Diabetes in her brother, father, and mother; Heart disease in her father; Heart disease (age of onset: 19) in her brother; Hypertension in her mother; Irritable bowel syndrome in her daughter; Kidney disease in her brother; Kidney failure in her brother; Leukemia in her father; Mental illness in her mother.She reports that she has never smoked. She has never used smokeless tobacco. She reports that she does not drink alcohol or use illicit drugs.  Current Outpatient Prescriptions on File Prior to Visit  Medication Sig Dispense Refill  . APIDRA SOLOSTAR 100 UNIT/ML Solostar Pen INJECT 10 UNITS SUBCUTANEOUSLY TWICE DAILY 15 pen 1  . atorvastatin (LIPITOR) 10 MG tablet Take 1 tablet (10 mg total) by mouth daily. 30 tablet 5  . Calcium Carbonate-Vitamin D (CALTRATE 600+D) 600-400 MG-UNIT per tablet Take 1 tablet by mouth daily.     . Cholecalciferol (VITAMIN D3) 5000 UNITS CAPS Take 1 capsule by mouth 2 (two) times daily.    [name] Kitchen CINNAMON PO Take 1,000 mg by mouth daily.    . DULoxetine (CYMBALTA) 60 MG capsule Take 1 capsule (60 mg total) by mouth daily. 30 capsule 3  . ENSURE PLUS (ENSURE PLUS) LIQD Take 237 mLs by mouth.    . fenofibrate 160 MG tablet TAKE ONE  TABLET BY MOUTH ONCE DAILY 30 tablet 5  . ferrous sulfate (SLOW FE) 160 (50 FE) MG TBCR SR tablet Take 160 mg by mouth daily.    . Flaxseed, Linseed, (FLAX SEED OIL) 1000 MG CAPS Take 1 capsule by mouth at bedtime.    . fluticasone (FLONASE) 50 MCG/ACT nasal spray Place 2 sprays into both nostrils daily. 16 g 1  . hydrochlorothiazide (HYDRODIURIL) 25 MG tablet Take 1 tablet (25 mg total) by mouth daily. 30 tablet 11  . Insulin  Glargine (LANTUS SOLOSTAR) 100 UNIT/ML Solostar Pen Inject 40 Units into the skin at bedtime. 15 mL 2  . lisinopril (PRINIVIL,ZESTRIL) 5 MG tablet Take 1 tablet (5 mg total) by mouth daily. 30 tablet 5  . Multiple Vitamin (MULTIVITAMIN) tablet Take 1 tablet by mouth daily.     . ondansetron (ZOFRAN ODT) 8 MG disintegrating tablet Take 1 tablet (8 mg total) by mouth every 8 (eight) hours as needed for nausea or vomiting. 6 tablet 0  . polyethylene glycol (MIRALAX / GLYCOLAX) packet Take 17 g by mouth daily.      . pregabalin (LYRICA) 150 MG capsule Take 1 capsule (150 mg total) by mouth 3 (three) times daily as needed. 90 capsule 5  . vitamin C (ASCORBIC ACID) 500 MG tablet Take 500 mg by mouth daily.    . [DISCONTINUED] metFORMIN (GLUCOPHAGE) 1000 MG tablet Take 1,000 mg by mouth 2 (two) times daily with a meal.      . [DISCONTINUED] omeprazole (PRILOSEC) 20 MG capsule Take 20 mg by mouth daily.       No current facility-administered medications on file prior to visit.     Objective:  Objective Physical Exam  Constitutional: She is oriented to person, place, and time. She appears well-developed and well-nourished.  HENT:  Head: Normocephalic and atraumatic.  Right Ear: External ear normal.  Left Ear: External ear normal.  + PND + errythema  Eyes: Conjunctivae and EOM are normal. Right eye exhibits no discharge. Left eye exhibits no discharge.  Neck: Normal range of motion. Neck supple. No JVD present. Carotid bruit is not present. No thyromegaly present.  Cardiovascular: Normal rate, regular rhythm and normal heart sounds.   No murmur heard. Pulmonary/Chest: Effort normal and breath sounds normal. No respiratory distress. She has no wheezes. She has no rales. She exhibits no tenderness.  Musculoskeletal: She exhibits no edema.  Lymphadenopathy:    She has cervical adenopathy.  Neurological: She is alert and oriented to person, place, and time.  Skin: Rash noted. Rash is macular and  pustular.     Psychiatric: She has a normal mood and affect.  Nursing note and vitals reviewed.  BP 122/72 mmHg  Pulse 75  Temp(Src) 98.5 F (36.9 C) (Oral)  Ht 5' 2"  (1.575 m)  Wt 193 lb (87.544 kg)  BMI 35.29 kg/m2  SpO2 98% Wt Readings from Last 3 Encounters:  01/03/16 193 lb (87.544 kg)  10/26/15 189 lb 9.6 oz (86.002 kg)  08/12/15 185 lb 12.8 oz (84.278 kg)     Lab Results  Component Value Date   WBC 5.3 01/03/2016   HGB 11.8* 01/03/2016   HCT 35.1* 01/03/2016   PLT 165.0 01/03/2016   GLUCOSE 304* 01/03/2016   CHOL 129 01/03/2016   TRIG 110.0 01/03/2016   HDL 37.50* 01/03/2016   LDLDIRECT 115.8 01/23/2012   LDLCALC 70 01/03/2016   ALT 17 01/03/2016   AST 17 01/03/2016   NA 135 01/03/2016   K 4.9 01/03/2016  CL 101 01/03/2016   CREATININE 1.60* 01/03/2016   BUN 24* 01/03/2016   CO2 27 01/03/2016   TSH 1.65 01/03/2016   INR 1.44 03/21/2013   HGBA1C 10.8* 01/03/2016   MICROALBUR 3.1* 08/12/2015    Dg Foot Complete Left  10/26/2015  CLINICAL DATA:  MRSA cellulitis of LEFT foot, foot swelling, onset of symptoms 10/09/2015, history hypertension, diabetes mellitus, prior MRSA infection RIGHT foot EXAM: LEFT FOOT - COMPLETE 3+ VIEW COMPARISON:  01/10/2012 FINDINGS: Bones appear slightly demineralized. Joint spaces preserved. No acute fracture, dislocation, or bone destruction. Plantar and Achilles insertion calcaneal spur formation. Diffuse soft tissue swelling without foci of soft tissue gas. IMPRESSION: Calcaneal spurring. Soft tissue swelling without acute osseous abnormalities. If patient has persistent clinical concern for osteomyelitis or deep soft tissue infection of the foot, consider MR. Electronically Signed   By: Lavonia Dana M.D.   On: 10/26/2015 15:09     Assessment & Plan:  Plan I am having [name] [name] start on levocetirizine. I am also having her maintain her Calcium Carbonate-Vitamin D, multivitamin, polyethylene glycol, hydrochlorothiazide,  CINNAMON PO, Vitamin D3, Flax Seed Oil, atorvastatin, fluticasone, lisinopril, ondansetron, ENSURE PLUS, vitamin C, Insulin Glargine, DULoxetine, ferrous sulfate, APIDRA SOLOSTAR, pregabalin, fenofibrate, and sulfamethoxazole-trimethoprim.  Meds ordered this encounter  Medications  . sulfamethoxazole-trimethoprim (BACTRIM DS,SEPTRA DS) 800-160 MG tablet    Sig: Take 1 tablet by mouth 2 (two) times daily.    Dispense:  20 tablet    Refill:  0  . levocetirizine (XYZAL) 5 MG tablet    Sig: Take 1 tablet (5 mg total) by mouth every evening.    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  Garnet Koyanagi, DO

## 2016-01-03 NOTE — Progress Notes (Signed)
Pre visit review using our clinic review tool, if applicable. No additional management support is needed unless otherwise documented below in the visit note. 

## 2016-01-03 NOTE — Patient Instructions (Signed)
Allergies  An allergy is an abnormal reaction to a substance by the body's defense system (immune system). Allergies can develop at any age.  WHAT CAUSES ALLERGIES?  An allergic reaction happens when the immune system mistakenly reacts to a normally harmless substance, called an allergen, as if it were harmful. The immune system releases antibodies to fight the substance. Antibodies eventually release a chemical called histamine into the bloodstream. The release of histamine is meant to protect the body from infection, but it also causes discomfort.  An allergic reaction can be triggered by:  · Eating an allergen.  · Inhaling an allergen.  · Touching an allergen.  WHAT TYPES OF ALLERGIES ARE THERE?  There are many types of allergies. Common types include:  · Seasonal allergies. People with this type of allergy are usually allergic to substances that are only present during certain seasons, such as molds and pollens.  · Food allergies.  · Drug allergies.  · Insect allergies.  · Animal dander allergies.  WHAT ARE SYMPTOMS OF ALLERGIES?  Possible allergy symptoms include:  · Swelling of the lips, face, tongue, mouth, or throat.  · Sneezing, coughing, or wheezing.  · Nasal congestion.  · Tingling in the mouth.  · Rash.  · Itching.  · Itchy, red, swollen areas of skin (hives).  · Watery eyes.  · Vomiting.  · Diarrhea.  · Dizziness.  · Lightheadedness.  · Fainting.  · Trouble breathing or swallowing.  · Chest tightness.  · Rapid heartbeat.  HOW ARE ALLERGIES DIAGNOSED?  Allergies are diagnosed with a medical and family history and one or more of the following:  · Skin tests.  · Blood tests.  · A food diary. A food diary is a record of all the foods and drinks you have in a day and of all the symptoms you experience.  · The results of an elimination diet. An elimination diet involves eliminating foods from your diet and then adding them back in one by one to find out if a certain food causes an allergic reaction.  HOW ARE  ALLERGIES TREATED?  There is no cure for allergies, but allergic reactions can be treated with medicine. Severe reactions usually need to be treated at a hospital.  HOW CAN REACTIONS BE PREVENTED?  The best way to prevent an allergic reaction is by avoiding the substance you are allergic to. Allergy shots and medicines can also help prevent reactions in some cases. People with severe allergic reactions may be able to prevent a life-threatening reaction called anaphylaxis with a medicine given right after exposure to the allergen.     This information is not intended to replace advice given to you by your health care provider. Make sure you discuss any questions you have with your health care provider.     Document Released: 01/09/2003 Document Revised: 11/06/2014 Document Reviewed: 07/28/2014  Elsevier Interactive Patient Education ©2016 [employer]

## 2016-01-04 ENCOUNTER — Telehealth: Payer: Self-pay | Admitting: Family Medicine

## 2016-01-04 LAB — LIPID PANEL
CHOL/HDL RATIO: 3
Cholesterol: 129 mg/dL (ref 0–200)
HDL: 37.5 mg/dL — ABNORMAL LOW (ref >39.00)
LDL CALC: 70 mg/dL (ref 0–99)
NONHDL: 91.88
Triglycerides: 110 mg/dL (ref 0.0–149.0)
VLDL: 22 mg/dL (ref 0.0–40.0)

## 2016-01-04 LAB — CBC WITH DIFFERENTIAL/PLATELET
BASOS PCT: 0.5 % (ref 0.0–3.0)
Basophils Absolute: 0 10*3/uL (ref 0.0–0.1)
EOS PCT: 1.8 % (ref 0.0–5.0)
Eosinophils Absolute: 0.1 10*3/uL (ref 0.0–0.7)
HEMATOCRIT: 35.1 % — AB (ref 36.0–46.0)
Hemoglobin: 11.8 g/dL — ABNORMAL LOW (ref 12.0–15.0)
LYMPHS PCT: 25.1 % (ref 12.0–46.0)
Lymphs Abs: 1.3 10*3/uL (ref 0.7–4.0)
MCHC: 33.7 g/dL (ref 30.0–36.0)
MCV: 81.2 fl (ref 78.0–100.0)
MONOS PCT: 6.6 % (ref 3.0–12.0)
Monocytes Absolute: 0.4 10*3/uL (ref 0.1–1.0)
NEUTROS ABS: 3.5 10*3/uL (ref 1.4–7.7)
Neutrophils Relative %: 66 % (ref 43.0–77.0)
PLATELETS: 165 10*3/uL (ref 150.0–400.0)
RBC: 4.32 Mil/uL (ref 3.87–5.11)
RDW: 15.7 % — AB (ref 11.5–15.5)
WBC: 5.3 10*3/uL (ref 4.0–10.5)

## 2016-01-04 LAB — COMPREHENSIVE METABOLIC PANEL
ALK PHOS: 59 U/L (ref 39–117)
ALT: 17 U/L (ref 0–35)
AST: 17 U/L (ref 0–37)
Albumin: 4.1 g/dL (ref 3.5–5.2)
BILIRUBIN TOTAL: 0.5 mg/dL (ref 0.2–1.2)
BUN: 24 mg/dL — ABNORMAL HIGH (ref 6–23)
CO2: 27 meq/L (ref 19–32)
CREATININE: 1.6 mg/dL — AB (ref 0.40–1.20)
Calcium: 9.7 mg/dL (ref 8.4–10.5)
Chloride: 101 mEq/L (ref 96–112)
GFR: 34.95 mL/min — AB (ref >60.00)
Glucose, Bld: 304 mg/dL — ABNORMAL HIGH (ref 70–99)
POTASSIUM: 4.9 meq/L (ref 3.5–5.1)
SODIUM: 135 meq/L (ref 135–145)
Total Protein: 7.4 g/dL (ref 6.0–8.3)

## 2016-01-04 LAB — HEMOGLOBIN A1C: Hgb A1c MFr Bld: 10.8 % — ABNORMAL HIGH (ref 4.6–6.5)

## 2016-01-04 LAB — TSH: TSH: 1.65 u[IU]/mL (ref 0.35–4.50)

## 2016-01-04 LAB — HEPATITIS C ANTIBODY: HCV AB: NEGATIVE

## 2016-01-04 MED ORDER — OXYCODONE-ACETAMINOPHEN 5-325 MG PO TABS: 1.0000 | ORAL_TABLET | ORAL | Status: DC | PRN

## 2016-01-04 NOTE — Telephone Encounter (Signed)
Refill x1 

## 2016-01-04 NOTE — Telephone Encounter (Signed)
Pt is requesting a refill on her oxyCODONE Rx.    Pharmacy:  [facility] [place]   CB: [phone]

## 2016-01-04 NOTE — Telephone Encounter (Signed)
Last OV: 01/03/16 Last filled: 12/23/15, #60, 0 RF Sig: Take 1-2 tablets by mouth every 4 (four) hours as needed for severe pain. UDS: 07/08/15, low risk

## 2016-01-04 NOTE — Telephone Encounter (Signed)
VM left advising Rx ready for pick up.     KP 

## 2016-01-09 ENCOUNTER — Other Ambulatory Visit: Payer: Self-pay | Admitting: Family Medicine

## 2016-01-14 ENCOUNTER — Other Ambulatory Visit: Payer: Self-pay

## 2016-01-14 DIAGNOSIS — R799 Abnormal finding of blood chemistry, unspecified: Secondary | ICD-10-CM

## 2016-01-24 ENCOUNTER — Telehealth: Payer: Self-pay | Admitting: Family Medicine

## 2016-01-24 DIAGNOSIS — M797 Fibromyalgia: Secondary | ICD-10-CM

## 2016-01-24 MED ORDER — OXYCODONE-ACETAMINOPHEN 5-325 MG PO TABS: 1.0000 | ORAL_TABLET | ORAL | Status: DC | PRN

## 2016-01-24 NOTE — Telephone Encounter (Signed)
whats going on that she needs more-- ?  Is she still in middle of tx for foot/ etc----  If she has been released -- refer to pain management.    Ok to refill #90

## 2016-01-24 NOTE — Telephone Encounter (Signed)
Last seen 01/03/16 and filled 01/04/16 #60 UDS 07/08/15 low risk   Please advise      KP

## 2016-01-24 NOTE — Telephone Encounter (Signed)
She said it is her fibromyalgia pain and she tried pain management in the past but it was too backed up. I made her aware we were going to place another referral. She verbalized understanding.    KP

## 2016-01-24 NOTE — Telephone Encounter (Signed)
Pt called for refill on oxycodone. She has 4 left. Takes up to 4/day. Best # to call when RX ready for pick up [phone].

## 2016-02-02 ENCOUNTER — Encounter: Payer: Self-pay | Admitting: Medical

## 2016-02-02 ENCOUNTER — Ambulatory Visit (HOSPITAL_BASED_OUTPATIENT_CLINIC_OR_DEPARTMENT_OTHER)
Admission: RE | Admit: 2016-02-02 | Discharge: 2016-02-02 | Disposition: A | Discharge status: Home / Self Care | Payer: BLUE CROSS/BLUE SHIELD | Source: Ambulatory Visit | Attending: Medical | Admitting: Medical

## 2016-02-02 ENCOUNTER — Telehealth: Payer: Self-pay | Admitting: Medical

## 2016-02-02 ENCOUNTER — Ambulatory Visit (INDEPENDENT_AMBULATORY_CARE_PROVIDER_SITE_OTHER): Payer: BLUE CROSS/BLUE SHIELD | Admitting: Medical

## 2016-02-02 VITALS — BP 120/72 | HR 78 | Temp 98.1°F | Ht 62.0 in | Wt 196.0 lb

## 2016-02-02 DIAGNOSIS — M79662 Pain in left lower leg: Secondary | ICD-10-CM | POA: Insufficient documentation

## 2016-02-02 DIAGNOSIS — S91302D Unspecified open wound, left foot, subsequent encounter: Secondary | ICD-10-CM

## 2016-02-02 DIAGNOSIS — E1040 Type 1 diabetes mellitus with diabetic neuropathy, unspecified: Secondary | ICD-10-CM | POA: Diagnosis not present

## 2016-02-02 DIAGNOSIS — M7732 Calcaneal spur, left foot: Secondary | ICD-10-CM | POA: Insufficient documentation

## 2016-02-02 DIAGNOSIS — M79672 Pain in left foot: Secondary | ICD-10-CM | POA: Insufficient documentation

## 2016-02-02 DIAGNOSIS — B9562 Methicillin resistant Staphylococcus aureus infection as the cause of diseases classified elsewhere: Secondary | ICD-10-CM

## 2016-02-02 DIAGNOSIS — L089 Local infection of the skin and subcutaneous tissue, unspecified: Secondary | ICD-10-CM | POA: Diagnosis not present

## 2016-02-02 DIAGNOSIS — R799 Abnormal finding of blood chemistry, unspecified: Secondary | ICD-10-CM | POA: Diagnosis not present

## 2016-02-02 DIAGNOSIS — J302 Other seasonal allergic rhinitis: Secondary | ICD-10-CM

## 2016-02-02 DIAGNOSIS — L03116 Cellulitis of left lower limb: Secondary | ICD-10-CM

## 2016-02-02 MED ORDER — LEVOCETIRIZINE DIHYDROCHLORIDE 5 MG PO TABS: 5.0000 mg | ORAL_TABLET | Freq: Every evening | ORAL | Status: DC

## 2016-02-02 MED ORDER — SULFAMETHOXAZOLE-TRIMETHOPRIM 800-160 MG PO TABS: 1.0000 | ORAL_TABLET | Freq: Two times a day (BID) | ORAL | Status: DC

## 2016-02-02 MED ORDER — FLUTICASONE PROPIONATE 50 MCG/ACT NA SUSP: 2.0000 | Freq: Every day | NASAL | Status: DC

## 2016-02-02 NOTE — Addendum Note (Signed)
Addended by: Anabel Halon on: 02/02/2016 09:34 PM   Modules accepted: Orders

## 2016-02-02 NOTE — Progress Notes (Signed)
Pre visit review using our clinic review tool, if applicable. No additional management support is needed unless otherwise documented below in the visit note. 

## 2016-02-02 NOTE — Patient Instructions (Addendum)
We will get xray of your foot today. Assess bone under wound.  For calf pain get lower extremity doppler.  For wound infection and non healing wound will refer to wound management.   Since diabetic try to keep sugars tightly controlled.  For possible wound infection rx bactrim.  Follow up 7 days or as needed  For allergic rhinitis will rx xyzal and flonase.
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
## 2016-02-02 NOTE — Telephone Encounter (Signed)
Will you try to get her in by this Friday. If not this Friday then ask her when she will be back from her vacation. I want her to be seen asap but pt has preplanned vacation. So if not Friday ask pt when she will be back in town.

## 2016-02-03 LAB — CBC WITH DIFFERENTIAL/PLATELET
BASOS ABS: 0 10*3/uL (ref 0.0–0.1)
BASOS PCT: 0.3 % (ref 0.0–3.0)
EOS ABS: 0.1 10*3/uL (ref 0.0–0.7)
EOS PCT: 2 % (ref 0.0–5.0)
HCT: 33.6 % — ABNORMAL LOW (ref 36.0–46.0)
Hemoglobin: 11.2 g/dL — ABNORMAL LOW (ref 12.0–15.0)
LYMPHS ABS: 1.1 10*3/uL (ref 0.7–4.0)
LYMPHS PCT: 19.1 % (ref 12.0–46.0)
MCHC: 33.3 g/dL (ref 30.0–36.0)
MCV: 81.3 fl (ref 78.0–100.0)
MONO ABS: 0.5 10*3/uL (ref 0.1–1.0)
MONOS PCT: 8.7 % (ref 3.0–12.0)
NEUTROS PCT: 69.9 % (ref 43.0–77.0)
Neutro Abs: 4.1 10*3/uL (ref 1.4–7.7)
Platelets: 183 10*3/uL (ref 150.0–400.0)
RBC: 4.13 Mil/uL (ref 3.87–5.11)
RDW: 15.2 % (ref 11.5–15.5)
WBC: 5.9 10*3/uL (ref 4.0–10.5)

## 2016-02-03 LAB — COMPREHENSIVE METABOLIC PANEL
ALBUMIN: 3.9 g/dL (ref 3.5–5.2)
ALK PHOS: 64 U/L (ref 39–117)
ALT: 15 U/L (ref 0–35)
AST: 18 U/L (ref 0–37)
BUN: 29 mg/dL — AB (ref 6–23)
CALCIUM: 9.6 mg/dL (ref 8.4–10.5)
CHLORIDE: 100 meq/L (ref 96–112)
CO2: 29 mEq/L (ref 19–32)
CREATININE: 1.7 mg/dL — AB (ref 0.40–1.20)
GFR: 32.58 mL/min — ABNORMAL LOW (ref >60.00)
Glucose, Bld: 399 mg/dL — ABNORMAL HIGH (ref 70–99)
Potassium: 4.7 mEq/L (ref 3.5–5.1)
SODIUM: 133 meq/L — AB (ref 135–145)
TOTAL PROTEIN: 7.7 g/dL (ref 6.0–8.3)
Total Bilirubin: 0.5 mg/dL (ref 0.2–1.2)

## 2016-02-03 NOTE — Telephone Encounter (Signed)
Per Marj she has spoken to the patient and she is aware of appt

## 2016-02-03 NOTE — Telephone Encounter (Signed)
She is out of town. Can she get in this Friday. If not this Friday then when she gets back from vacation. Will you call her and see when back from vacation.

## 2016-02-03 NOTE — Telephone Encounter (Signed)
She is already scheduled for 02/07/16

## 2016-02-04 ENCOUNTER — Other Ambulatory Visit: Payer: BLUE CROSS/BLUE SHIELD

## 2016-02-07 ENCOUNTER — Other Ambulatory Visit: Payer: Self-pay | Admitting: Family Medicine

## 2016-02-07 DIAGNOSIS — N289 Disorder of kidney and ureter, unspecified: Secondary | ICD-10-CM

## 2016-02-18 ENCOUNTER — Telehealth: Payer: Self-pay | Admitting: Family Medicine

## 2016-02-18 NOTE — Telephone Encounter (Signed)
Caller name: Colletta [place]  Relation to pt:  [place]  Call back [place]    Reason for call:  Dr. Benjamine Sprague from [facility] requesting recent office notes and labs please fax # [phone]. Patient is currently under care for a open wound.

## 2016-02-18 NOTE — Telephone Encounter (Signed)
Faxed.   KP 

## 2016-02-24 ENCOUNTER — Other Ambulatory Visit: Payer: Self-pay | Admitting: Family Medicine

## 2016-02-24 NOTE — Telephone Encounter (Signed)
Pt is requesting a refill on her oxyCODONE Rx.    Pharmacy: [facility] [place]   CB: [phone]

## 2016-02-24 NOTE — Telephone Encounter (Signed)
OK to refill Oxycodone as requested if due

## 2016-02-24 NOTE — Telephone Encounter (Signed)
Last sen 01/03/16 and filled 01/24/16 #90  UDS 07/08/15 low risk  Pain management referral pending.  Please advise   KP

## 2016-02-25 MED ORDER — OXYCODONE-ACETAMINOPHEN 5-325 MG PO TABS: 1.0000 | ORAL_TABLET | ORAL | Status: DC | PRN

## 2016-02-25 NOTE — Telephone Encounter (Signed)
Rx printed and forwarded for signature.

## 2016-02-26 ENCOUNTER — Emergency Department (HOSPITAL_BASED_OUTPATIENT_CLINIC_OR_DEPARTMENT_OTHER): Payer: BLUE CROSS/BLUE SHIELD

## 2016-02-26 ENCOUNTER — Emergency Department (HOSPITAL_BASED_OUTPATIENT_CLINIC_OR_DEPARTMENT_OTHER)
Admission: EM | Admit: 2016-02-26 | Discharge: 2016-02-26 | Disposition: A | Discharge status: Home / Self Care | Payer: BLUE CROSS/BLUE SHIELD | Attending: Emergency Medicine | Admitting: Emergency Medicine

## 2016-02-26 ENCOUNTER — Encounter (HOSPITAL_BASED_OUTPATIENT_CLINIC_OR_DEPARTMENT_OTHER): Payer: Self-pay | Admitting: *Deleted

## 2016-02-26 DIAGNOSIS — R079 Chest pain, unspecified: Secondary | ICD-10-CM | POA: Diagnosis not present

## 2016-02-26 DIAGNOSIS — E7800 Pure hypercholesterolemia, unspecified: Secondary | ICD-10-CM | POA: Diagnosis not present

## 2016-02-26 DIAGNOSIS — N189 Chronic kidney disease, unspecified: Secondary | ICD-10-CM | POA: Insufficient documentation

## 2016-02-26 DIAGNOSIS — Z79899 Other long term (current) drug therapy: Secondary | ICD-10-CM | POA: Diagnosis not present

## 2016-02-26 DIAGNOSIS — E1122 Type 2 diabetes mellitus with diabetic chronic kidney disease: Secondary | ICD-10-CM | POA: Diagnosis not present

## 2016-02-26 DIAGNOSIS — E1165 Type 2 diabetes mellitus with hyperglycemia: Secondary | ICD-10-CM | POA: Diagnosis not present

## 2016-02-26 DIAGNOSIS — R739 Hyperglycemia, unspecified: Secondary | ICD-10-CM

## 2016-02-26 DIAGNOSIS — I129 Hypertensive chronic kidney disease with stage 1 through stage 4 chronic kidney disease, or unspecified chronic kidney disease: Secondary | ICD-10-CM | POA: Insufficient documentation

## 2016-02-26 LAB — COMPREHENSIVE METABOLIC PANEL
ALBUMIN: 3.9 g/dL (ref 3.5–5.0)
ALK PHOS: 55 U/L (ref 38–126)
ALT: 21 U/L (ref 14–54)
AST: 25 U/L (ref 15–41)
Anion gap: 7 (ref 5–15)
BUN: 29 mg/dL — ABNORMAL HIGH (ref 6–20)
CALCIUM: 9.4 mg/dL (ref 8.9–10.3)
CHLORIDE: 106 mmol/L (ref 101–111)
CO2: 23 mmol/L (ref 22–32)
CREATININE: 1.69 mg/dL — AB (ref 0.44–1.00)
GFR calc Af Amer: 37 mL/min — ABNORMAL LOW (ref >60)
GFR calc non Af Amer: 32 mL/min — ABNORMAL LOW (ref >60)
GLUCOSE: 328 mg/dL — AB (ref 65–99)
Potassium: 5 mmol/L (ref 3.5–5.1)
SODIUM: 136 mmol/L (ref 135–145)
TOTAL PROTEIN: 7.8 g/dL (ref 6.5–8.1)
Total Bilirubin: 0.5 mg/dL (ref 0.3–1.2)

## 2016-02-26 LAB — URINALYSIS, ROUTINE W REFLEX MICROSCOPIC
BILIRUBIN URINE: NEGATIVE
Glucose, UA: >1000 mg/dL — AB
Hgb urine dipstick: NEGATIVE
KETONES UR: NEGATIVE mg/dL
NITRITE: NEGATIVE
PH: 7 (ref 5.0–8.0)
Protein, ur: NEGATIVE mg/dL
SPECIFIC GRAVITY, URINE: 1.017 (ref 1.005–1.030)

## 2016-02-26 LAB — CBC WITH DIFFERENTIAL/PLATELET
BASOS ABS: 0 10*3/uL (ref 0.0–0.1)
BASOS PCT: 0 %
Eosinophils Absolute: 0.1 10*3/uL (ref 0.0–0.7)
Eosinophils Relative: 2 %
HEMATOCRIT: 33.6 % — AB (ref 36.0–46.0)
Hemoglobin: 10.9 g/dL — ABNORMAL LOW (ref 12.0–15.0)
LYMPHS PCT: 22 %
Lymphs Abs: 1.2 10*3/uL (ref 0.7–4.0)
MCH: 27 pg (ref 26.0–34.0)
MCHC: 32.4 g/dL (ref 30.0–36.0)
MCV: 83.2 fL (ref 78.0–100.0)
Monocytes Absolute: 0.5 10*3/uL (ref 0.1–1.0)
Monocytes Relative: 9 %
NEUTROS ABS: 3.8 10*3/uL (ref 1.7–7.7)
Neutrophils Relative %: 67 %
PLATELETS: 139 10*3/uL — AB (ref 150–400)
RBC: 4.04 MIL/uL (ref 3.87–5.11)
RDW: 14.1 % (ref 11.5–15.5)
WBC: 5.6 10*3/uL (ref 4.0–10.5)

## 2016-02-26 LAB — URINE MICROSCOPIC-ADD ON

## 2016-02-26 LAB — TROPONIN I

## 2016-02-26 LAB — LIPASE, BLOOD: Lipase: 53 U/L — ABNORMAL HIGH (ref 11–51)

## 2016-02-26 MED ORDER — LORAZEPAM 2 MG/ML IJ SOLN
0.5000 mg | Freq: Once | INTRAMUSCULAR | Status: AC
  Administered 2016-02-26: 0.5 mg via INTRAVENOUS
  Filled 2016-02-26: qty 1

## 2016-02-26 MED ORDER — FENTANYL CITRATE (PF) 100 MCG/2ML IJ SOLN
50.0000 ug | Freq: Once | INTRAMUSCULAR | Status: AC
Start: 2016-02-26 — End: 2016-02-26
  Administered 2016-02-26: 50 ug via INTRAVENOUS
  Filled 2016-02-26: qty 2

## 2016-02-26 MED ORDER — FENTANYL CITRATE (PF) 100 MCG/2ML IJ SOLN
50.0000 ug | Freq: Once | INTRAMUSCULAR | Status: AC
  Administered 2016-02-26: 50 ug via INTRAVENOUS
  Filled 2016-02-26: qty 2

## 2016-02-26 MED ORDER — INSULIN REGULAR HUMAN 100 UNIT/ML IJ SOLN: 8.0000 [IU] | Freq: Once | INTRAMUSCULAR | Status: DC

## 2016-02-26 MED ORDER — FAMOTIDINE IN NACL 20-0.9 MG/50ML-% IV SOLN
20.0000 mg | Freq: Once | INTRAVENOUS | Status: AC
Start: 2016-02-26 — End: 2016-02-26
  Administered 2016-02-26: 20 mg via INTRAVENOUS
  Filled 2016-02-26: qty 50

## 2016-02-26 MED ORDER — ASPIRIN 81 MG PO CHEW: 324.0000 mg | CHEWABLE_TABLET | Freq: Once | ORAL | Status: DC

## 2016-02-26 NOTE — ED Notes (Signed)
Called to pt room. Pt states she wants to be discharged to home and f/u with her PCP tomorrow. Pt still reporting chest pain 9/10 intensity. EDP notified.

## 2016-02-26 NOTE — ED Notes (Signed)
Pt placed on auto vitals Q30.

## 2016-02-26 NOTE — ED Notes (Signed)
MD at bedside. 

## 2016-02-26 NOTE — ED Notes (Signed)
Pt describes CP x 2 weeks. Worse last few days. Radiated to left arm and up into neck and shoulders. Hx MI. +vomiting. Also reports nosebleed and H/A. Hyperventilating at triage.

## 2016-02-26 NOTE — ED Notes (Signed)
Pt and husband given d/c instructions as per chart. Verbalizes understanding. No questions.

## 2016-02-26 NOTE — Discharge Instructions (Signed)
Hyperglycemia Hyperglycemia occurs when the glucose (sugar) in your blood is too high. Hyperglycemia can happen for many reasons, but it most often happens to people who do not know they have diabetes or are not managing their diabetes properly.  CAUSES  Whether you have diabetes or not, there are other causes of hyperglycemia. Hyperglycemia can occur when you have diabetes, but it can also occur in other situations that you might not be as aware of, such as: Diabetes  If you have diabetes and are having problems controlling your blood glucose, hyperglycemia could occur because of some of the following reasons:  Not following your meal plan.  Not taking your diabetes medications or not taking it properly.  Exercising less or doing less activity than you normally do.  Being sick. Pre-diabetes  This cannot be ignored. Before people develop Type 2 diabetes, they almost always have "pre-diabetes." This is when your blood glucose levels are higher than normal, but not yet high enough to be diagnosed as diabetes. Research has shown that some long-term damage to the body, especially the heart and circulatory system, may already be occurring during pre-diabetes. If you take action to manage your blood glucose when you have pre-diabetes, you may delay or prevent Type 2 diabetes from developing. Stress  If you have diabetes, you may be "diet" controlled or on oral medications or insulin to control your diabetes. However, you may find that your blood glucose is higher than usual in the hospital whether you have diabetes or not. This is often referred to as "stress hyperglycemia." Stress can elevate your blood glucose. This happens because of hormones put out by the body during times of stress. If stress has been the cause of your high blood glucose, it can be followed regularly by your caregiver. That way he/she can make sure your hyperglycemia does not continue to get worse or progress to  diabetes. Steroids  Steroids are medications that act on the infection fighting system (immune system) to block inflammation or infection. One side effect can be a rise in blood glucose. Most people can produce enough extra insulin to allow for this rise, but for those who cannot, steroids make blood glucose levels go even higher. It is not unusual for steroid treatments to "uncover" diabetes that is developing. It is not always possible to determine if the hyperglycemia will go away after the steroids are stopped. A special blood test called an A1c is sometimes done to determine if your blood glucose was elevated before the steroids were started. SYMPTOMS  Thirsty.  Frequent urination.  Dry mouth.  Blurred vision.  Tired or fatigue.  Weakness.  Sleepy.  Tingling in feet or leg. DIAGNOSIS  Diagnosis is made by monitoring blood glucose in one or all of the following ways:  A1c test. This is a chemical found in your blood.  Fingerstick blood glucose monitoring.  Laboratory results. TREATMENT  First, knowing the cause of the hyperglycemia is important before the hyperglycemia can be treated. Treatment may include, but is not be limited to:  Education.  Change or adjustment in medications.  Change or adjustment in meal plan.  Treatment for an illness, infection, etc.  More frequent blood glucose monitoring.  Change in exercise plan.  Decreasing or stopping steroids.  Lifestyle changes. HOME CARE INSTRUCTIONS   Test your blood glucose as directed.  Exercise regularly. Your caregiver will give you instructions about exercise. Pre-diabetes or diabetes which comes on with stress is helped by exercising.  Eat wholesome,  balanced meals. Eat often and at regular, fixed times. Your caregiver or nutritionist will give you a meal plan to guide your sugar intake.  Being at an ideal weight is important. If needed, losing as little as 10 to 15 pounds may help improve blood  glucose levels. SEEK MEDICAL CARE IF:   You have questions about medicine, activity, or diet.  You continue to have symptoms (problems such as increased thirst, urination, or weight gain). SEEK IMMEDIATE MEDICAL CARE IF:   You are vomiting or have diarrhea.  Your breath smells fruity.  You are breathing faster or slower.  You are very sleepy or incoherent.  You have numbness, tingling, or pain in your feet or hands.  You have chest pain.  Your symptoms get worse even though you have been following your caregiver's orders.  If you have any other questions or concerns.   This information is not intended to replace advice given to you by your health care provider. Make sure you discuss any questions you have with your health care provider.   Document Released: 04/11/2001 Document Revised: 01/08/2012 Document Reviewed: 06/22/2015 Elsevier Interactive Patient Education 2016 Else[employer] Inc[employer]  Nonspecific Chest Pain  Chest pain can be caused by many different conditions. There is always a chance that your pain could be related to something serious, such as a heart attack or a blood clot in your lungs. Chest pain can also be caused by conditions that are not life-threatening. If you have chest pain, it is very important to follow up with your health care provider. CAUSES  Chest pain can be caused by:  Heartburn.  Pneumonia or bronchitis.  Anxiety or stress.  Inflammation around your heart (pericarditis) or lung (pleuritis or pleurisy).  A blood clot in your lung.  A collapsed lung (pneumothorax). It can develop suddenly on its own (spontaneous pneumothorax) or from trauma to the chest.  Shingles infection (varicella-zoster virus).  Heart attack.  Damage to the bones, muscles, and cartilage that make up your chest wall. This can include:  Bruised bones due to injury.  Strained muscles or cartilage due to frequent or repeated coughing or overwork.  Fracture to one or more  ribs.  Sore cartilage due to inflammation (costochondritis). RISK FACTORS  Risk factors for chest pain may include:  Activities that increase your risk for trauma or injury to your chest.  Respiratory infections or conditions that cause frequent coughing.  Medical conditions or overeating that can cause heartburn.  Heart disease or family history of heart disease.  Conditions or health behaviors that increase your risk of developing a blood clot.  Having had chicken pox (varicella zoster). SIGNS AND SYMPTOMS Chest pain can feel like:  Burning or tingling on the surface of your chest or deep in your chest.  Crushing, pressure, aching, or squeezing pain.  Dull or sharp pain that is worse when you move, cough, or take a deep breath.  Pain that is also felt in your back, neck, shoulder, or arm, or pain that spreads to any of these areas. Your chest pain may come and go, or it may stay constant. DIAGNOSIS Lab tests or other studies may be needed to find the cause of your pain. Your health care provider may have you take a test called an ambulatory ECG (electrocardiogram). An ECG records your heartbeat patterns at the time the test is performed. You may also have other tests, such as:  Transthoracic echocardiogram (TTE). During echocardiography, sound waves are used to create a  picture of all of the heart structures and to look at how blood flows through your heart.  Transesophageal echocardiogram (TEE).This is a more advanced imaging test that obtains images from inside your body. It allows your health care provider to see your heart in finer detail.  Cardiac monitoring. This allows your health care provider to monitor your heart rate and rhythm in real time.  Holter monitor. This is a portable device that records your heartbeat and can help to diagnose abnormal heartbeats. It allows your health care provider to track your heart activity for several days, if needed.  Stress tests.  These can be done through exercise or by taking medicine that makes your heart beat more quickly.  Blood tests.  Imaging tests. TREATMENT  Your treatment depends on what is causing your chest pain. Treatment may include:  Medicines. These may include:  Acid blockers for heartburn.  Anti-inflammatory medicine.  Pain medicine for inflammatory conditions.  Antibiotic medicine, if an infection is present.  Medicines to dissolve blood clots.  Medicines to treat coronary artery disease.  Supportive care for conditions that do not require medicines. This may include:  Resting.  Applying heat or cold packs to injured areas.  Limiting activities until pain decreases. HOME CARE INSTRUCTIONS  If you were prescribed an antibiotic medicine, finish it all even if you start to feel better.  Avoid any activities that bring on chest pain.  Do not use any tobacco products, including cigarettes, chewing tobacco, or electronic cigarettes. If you need help quitting, ask your health care provider.  Do not drink alcohol.  Take medicines only as directed by your health care provider.  Keep all follow-up visits as directed by your health care provider. This is important. This includes any further testing if your chest pain does not go away.  If heartburn is the cause for your chest pain, you may be told to keep your head raised (elevated) while sleeping. This reduces the chance that acid will go from your stomach into your esophagus.  Make lifestyle changes as directed by your health care provider. These may include:  Getting regular exercise. Ask your health care provider to suggest some activities that are safe for you.  Eating a heart-healthy diet. A registered dietitian can help you to learn healthy eating options.  Maintaining a healthy weight.  Managing diabetes, if necessary.  Reducing stress. SEEK MEDICAL CARE IF:  Your chest pain does not go away after treatment.  You have  a rash with blisters on your chest.  You have a fever. SEEK IMMEDIATE MEDICAL CARE IF:   Your chest pain is worse.  You have an increasing cough, or you cough up blood.  You have severe abdominal pain.  You have severe weakness.  You faint.  You have chills.  You have sudden, unexplained chest discomfort.  You have sudden, unexplained discomfort in your arms, back, neck, or jaw.  You have shortness of breath at any time.  You suddenly start to sweat, or your skin gets clammy.  You feel nauseous or you vomit.  You suddenly feel light-headed or dizzy.  Your heart begins to beat quickly, or it feels like it is skipping beats. These symptoms may represent a serious problem that is an emergency. Do not wait to see if the symptoms will go away. Get medical help right away. Call your local emergency services (911 in the [place] Do not drive yourself to the hospital.   This information is not intended to replace  advice given to you by your health care provider. Make sure you discuss any questions you have with your health care provider.   Document Released: 07/26/2005 Document Revised: 11/06/2014 Document Reviewed: 05/22/2014 Elsevier Interactive Patient Education [employer].
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
## 2016-02-28 NOTE — Telephone Encounter (Signed)
VM left advising Rx ready for pick up.     KP 

## 2016-03-02 ENCOUNTER — Ambulatory Visit: Payer: BLUE CROSS/BLUE SHIELD | Admitting: Family Medicine

## 2016-03-02 ENCOUNTER — Ambulatory Visit: Payer: BLUE CROSS/BLUE SHIELD | Admitting: Internal Medicine

## 2016-03-02 ENCOUNTER — Other Ambulatory Visit: Payer: Self-pay | Admitting: Medical

## 2016-03-02 DIAGNOSIS — Z0289 Encounter for other administrative examinations: Secondary | ICD-10-CM

## 2016-03-06 ENCOUNTER — Telehealth: Payer: Self-pay | Admitting: Family Medicine

## 2016-03-06 ENCOUNTER — Inpatient Hospital Stay: Payer: BLUE CROSS/BLUE SHIELD | Admitting: Family Medicine

## 2016-03-06 DIAGNOSIS — Z0289 Encounter for other administrative examinations: Secondary | ICD-10-CM

## 2016-03-09 DIAGNOSIS — L89609 Pressure ulcer of unspecified heel, unspecified stage: Secondary | ICD-10-CM | POA: Insufficient documentation

## 2016-03-10 NOTE — Telephone Encounter (Signed)
Pt was no show 03/06/16 11:30am for hospital f/u, pt has not rescheduled, 2nd no show w/in 12 months, charge or no charge?

## 2016-03-10 NOTE — Telephone Encounter (Signed)
Please call her

## 2016-03-12 ENCOUNTER — Other Ambulatory Visit: Payer: Self-pay | Admitting: Family Medicine

## 2016-03-13 ENCOUNTER — Encounter: Payer: Self-pay | Admitting: Internal Medicine

## 2016-03-14 NOTE — Telephone Encounter (Signed)
Rx printed,signed, and faxed to the pharmacy.  Confirmation received.//AB/CMA

## 2016-03-14 NOTE — Telephone Encounter (Signed)
charge 

## 2016-03-14 NOTE — Telephone Encounter (Signed)
LM to reschedule, charge or no charge for 03/06/16?

## 2016-03-17 ENCOUNTER — Encounter: Payer: Self-pay | Admitting: Family Medicine

## 2016-03-17 ENCOUNTER — Ambulatory Visit (INDEPENDENT_AMBULATORY_CARE_PROVIDER_SITE_OTHER): Payer: BLUE CROSS/BLUE SHIELD | Admitting: Family Medicine

## 2016-03-17 VITALS — HR 85 | Temp 97.8°F | Ht 62.0 in | Wt 193.4 lb

## 2016-03-17 DIAGNOSIS — E1151 Type 2 diabetes mellitus with diabetic peripheral angiopathy without gangrene: Secondary | ICD-10-CM | POA: Diagnosis not present

## 2016-03-17 DIAGNOSIS — Z8249 Family history of ischemic heart disease and other diseases of the circulatory system: Secondary | ICD-10-CM

## 2016-03-17 DIAGNOSIS — E1165 Type 2 diabetes mellitus with hyperglycemia: Secondary | ICD-10-CM

## 2016-03-17 DIAGNOSIS — IMO0002 Reserved for concepts with insufficient information to code with codable children: Secondary | ICD-10-CM

## 2016-03-17 DIAGNOSIS — I10 Essential (primary) hypertension: Secondary | ICD-10-CM | POA: Diagnosis not present

## 2016-03-17 DIAGNOSIS — F32A Depression, unspecified: Secondary | ICD-10-CM

## 2016-03-17 DIAGNOSIS — R079 Chest pain, unspecified: Secondary | ICD-10-CM | POA: Diagnosis not present

## 2016-03-17 DIAGNOSIS — R55 Syncope and collapse: Secondary | ICD-10-CM

## 2016-03-17 DIAGNOSIS — E785 Hyperlipidemia, unspecified: Secondary | ICD-10-CM

## 2016-03-17 DIAGNOSIS — G894 Chronic pain syndrome: Secondary | ICD-10-CM

## 2016-03-17 DIAGNOSIS — F329 Major depressive disorder, single episode, unspecified: Secondary | ICD-10-CM

## 2016-03-17 MED ORDER — ATORVASTATIN CALCIUM 10 MG PO TABS: 10.0000 mg | ORAL_TABLET | Freq: Every day | ORAL | Status: DC

## 2016-03-17 MED ORDER — OXYCODONE-ACETAMINOPHEN 5-325 MG PO TABS: 1.0000 | ORAL_TABLET | ORAL | Status: DC | PRN

## 2016-03-17 MED ORDER — DULOXETINE HCL 60 MG PO CPEP: 60.0000 mg | ORAL_CAPSULE | Freq: Every day | ORAL | Status: DC

## 2016-03-17 MED ORDER — HYDROCHLOROTHIAZIDE 25 MG PO TABS: 25.0000 mg | ORAL_TABLET | Freq: Every day | ORAL | Status: DC

## 2016-03-17 MED ORDER — FENOFIBRATE 160 MG PO TABS: 160.0000 mg | ORAL_TABLET | Freq: Every day | ORAL | Status: DC

## 2016-03-17 NOTE — Patient Instructions (Signed)
Basic Carbohydrate Counting for Diabetes Mellitus Carbohydrate counting is a method for keeping track of the amount of carbohydrates you eat. Eating carbohydrates naturally increases the level of sugar (glucose) in your blood, so it is important for you to know the amount that is okay for you to have in every meal. Carbohydrate counting helps keep the level of glucose in your blood within normal limits. The amount of carbohydrates allowed is different for every person. A dietitian can help you calculate the amount that is right for you. Once you know the amount of carbohydrates you can have, you can count the carbohydrates in the foods you want to eat. Carbohydrates are found in the following foods:  Grains, such as breads and cereals.  Dried beans and soy products.  Starchy vegetables, such as potatoes, peas, and corn.  Fruit and fruit juices.  Milk and yogurt.  Sweets and snack foods, such as cake, cookies, candy, chips, soft drinks, and fruit drinks. CARBOHYDRATE COUNTING There are two ways to count the carbohydrates in your food. You can use either of the methods or a combination of both. Reading the "Nutrition Facts" on Packaged Food The "Nutrition Facts" is an area that is included on the labels of almost all packaged food and beverages in the [place]. It includes the serving size of that food or beverage and information about the nutrients in each serving of the food, including the grams (g) of carbohydrate per serving.  Decide the number of servings of this food or beverage that you will be able to eat or drink. Multiply that number of servings by the number of grams of carbohydrate that is listed on the label for that serving. The total will be the amount of carbohydrates you will be having when you eat or drink this food or beverage. Learning Standard Serving Sizes of Food When you eat food that is not packaged or does not include "Nutrition Facts" on the label, you need to  measure the servings in order to count the amount of carbohydrates.A serving of most carbohydrate-rich foods contains about 15 g of carbohydrates. The following list includes serving sizes of carbohydrate-rich foods that provide 15 g ofcarbohydrate per serving:   1 slice of bread (1 oz) or 1 six-inch tortilla.    of a hamburger bun or English muffin.  4-6 crackers.   cup unsweetened dry cereal.    cup hot cereal.   cup rice or pasta.    cup mashed potatoes or  of a large baked potato.  1 cup fresh fruit or one small piece of fruit.    cup canned or frozen fruit or fruit juice.  1 cup milk.   cup plain fat-free yogurt or yogurt sweetened with artificial sweeteners.   cup cooked dried beans or starchy vegetable, such as peas, corn, or potatoes.  Decide the number of standard-size servings that you will eat. Multiply that number of servings by 15 (the grams of carbohydrates in that serving). For example, if you eat 2 cups of strawberries, you will have eaten 2 servings and 30 g of carbohydrates (2 servings x 15 g = 30 g). For foods such as soups and casseroles, in which more than one food is mixed in, you will need to count the carbohydrates in each food that is included. EXAMPLE OF CARBOHYDRATE COUNTING Sample Dinner  3 oz chicken breast.   cup of brown rice.   cup of corn.  1 cup milk.   1 cup strawberries with   sugar-free whipped topping.  Carbohydrate Calculation Step 1: Identify the foods that contain carbohydrates:   Rice.   Corn.   Milk.   Strawberries. Step 2:Calculate the number of servings eaten of each:   2 servings of rice.   1 serving of corn.   1 serving of milk.   1 serving of strawberries. Step 3: Multiply each of those number of servings by 15 g:   2 servings of rice x 15 g = 30 g.   1 serving of corn x 15 g = 15 g.   1 serving of milk x 15 g = 15 g.   1 serving of strawberries x 15 g = 15 g. Step 4: Add  together all of the amounts to find the total grams of carbohydrates eaten: 30 g + 15 g + 15 g + 15 g = 75 g.   This information is not intended to replace advice given to you by your health care provider. Make sure you discuss any questions you have with your health care provider.   Document Released: 10/16/2005 Document Revised: 11/06/2014 Document Reviewed: 09/12/2013 Elsevier Interactive Patient Education 2016 Elsevier Inc.  
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
## 2016-03-17 NOTE — Assessment & Plan Note (Signed)
Pt refused admission  Refer to cardiology Check echo and stress test ? Syncope as well-- carotid doppler Pt instructed to go to ER if chest pain returns

## 2016-03-17 NOTE — Progress Notes (Signed)
Pre visit review using our clinic tool,if applicable. No additional management support is needed unless otherwise documented below in the visit note.  

## 2016-03-20 ENCOUNTER — Encounter: Payer: Self-pay | Admitting: Family Medicine

## 2016-03-20 DIAGNOSIS — S86019A Strain of unspecified Achilles tendon, initial encounter: Secondary | ICD-10-CM | POA: Insufficient documentation

## 2016-03-20 NOTE — Telephone Encounter (Signed)
Marked to charge and mailing no show letter °

## 2016-03-25 ENCOUNTER — Encounter (HOSPITAL_BASED_OUTPATIENT_CLINIC_OR_DEPARTMENT_OTHER): Payer: Self-pay | Admitting: Emergency Medicine

## 2016-03-25 ENCOUNTER — Emergency Department (HOSPITAL_BASED_OUTPATIENT_CLINIC_OR_DEPARTMENT_OTHER)
Admission: EM | Admit: 2016-03-25 | Discharge: 2016-03-25 | Disposition: A | Discharge status: Home / Self Care | Payer: BLUE CROSS/BLUE SHIELD | Attending: Emergency Medicine | Admitting: Emergency Medicine

## 2016-03-25 DIAGNOSIS — Z79899 Other long term (current) drug therapy: Secondary | ICD-10-CM | POA: Insufficient documentation

## 2016-03-25 DIAGNOSIS — I129 Hypertensive chronic kidney disease with stage 1 through stage 4 chronic kidney disease, or unspecified chronic kidney disease: Secondary | ICD-10-CM | POA: Diagnosis not present

## 2016-03-25 DIAGNOSIS — Z4689 Encounter for fitting and adjustment of other specified devices: Secondary | ICD-10-CM | POA: Insufficient documentation

## 2016-03-25 DIAGNOSIS — Y939 Activity, unspecified: Secondary | ICD-10-CM | POA: Diagnosis not present

## 2016-03-25 DIAGNOSIS — N189 Chronic kidney disease, unspecified: Secondary | ICD-10-CM | POA: Insufficient documentation

## 2016-03-25 DIAGNOSIS — Z4789 Encounter for other orthopedic aftercare: Secondary | ICD-10-CM

## 2016-03-25 DIAGNOSIS — I252 Old myocardial infarction: Secondary | ICD-10-CM | POA: Insufficient documentation

## 2016-03-25 DIAGNOSIS — S90811A Abrasion, right foot, initial encounter: Secondary | ICD-10-CM | POA: Diagnosis not present

## 2016-03-25 DIAGNOSIS — M160 Bilateral primary osteoarthritis of hip: Secondary | ICD-10-CM | POA: Insufficient documentation

## 2016-03-25 DIAGNOSIS — E119 Type 2 diabetes mellitus without complications: Secondary | ICD-10-CM | POA: Insufficient documentation

## 2016-03-25 DIAGNOSIS — Y999 Unspecified external cause status: Secondary | ICD-10-CM | POA: Insufficient documentation

## 2016-03-25 DIAGNOSIS — Y929 Unspecified place or not applicable: Secondary | ICD-10-CM | POA: Diagnosis not present

## 2016-03-25 DIAGNOSIS — L98499 Non-pressure chronic ulcer of skin of other sites with unspecified severity: Secondary | ICD-10-CM

## 2016-03-25 DIAGNOSIS — X58XXXA Exposure to other specified factors, initial encounter: Secondary | ICD-10-CM | POA: Diagnosis not present

## 2016-03-25 DIAGNOSIS — T148XXA Other injury of unspecified body region, initial encounter: Secondary | ICD-10-CM

## 2016-03-25 DIAGNOSIS — Z794 Long term (current) use of insulin: Secondary | ICD-10-CM | POA: Diagnosis not present

## 2016-03-25 DIAGNOSIS — M79671 Pain in right foot: Secondary | ICD-10-CM | POA: Diagnosis present

## 2016-03-25 DIAGNOSIS — L97419 Non-pressure chronic ulcer of right heel and midfoot with unspecified severity: Secondary | ICD-10-CM | POA: Diagnosis not present

## 2016-03-25 NOTE — Discharge Instructions (Signed)
Diabetes and Foot Care Diabetes may cause you to have problems because of poor blood supply (circulation) to your feet and legs. This may cause the skin on your feet to become thinner, break easier, and heal more slowly. Your skin may become dry, and the skin may peel and crack. You may also have nerve damage in your legs and feet causing decreased feeling in them. You may not notice minor injuries to your feet that could lead to infections or more serious problems. Taking care of your feet is one of the most important things you can do for yourself.  HOME CARE INSTRUCTIONS  Wear shoes at all times, even in the house. Do not go barefoot. Bare feet are easily injured.  Check your feet daily for blisters, cuts, and redness. If you cannot see the bottom of your feet, use a mirror or ask someone for help.  Wash your feet with warm water (do not use hot water) and mild soap. Then pat your feet and the areas between your toes until they are completely dry. Do not soak your feet as this can dry your skin.  Apply a moisturizing lotion or petroleum jelly (that does not contain alcohol and is unscented) to the skin on your feet and to dry, brittle toenails. Do not apply lotion between your toes.  Trim your toenails straight across. Do not dig under them or around the cuticle. File the edges of your nails with an emery board or nail file.  Do not cut corns or calluses or try to remove them with medicine.  Wear clean socks or stockings every day. Make sure they are not too tight. Do not wear knee-high stockings since they may decrease blood flow to your legs.  Wear shoes that fit properly and have enough cushioning. To break in new shoes, wear them for just a few hours a day. This prevents you from injuring your feet. Always look in your shoes before you put them on to be sure there are no objects inside.  Do not cross your legs. This may decrease the blood flow to your feet.  If you find a minor scrape,  cut, or break in the skin on your feet, keep it and the skin around it clean and dry. These areas may be cleansed with mild soap and water. Do not cleanse the area with peroxide, alcohol, or iodine.  When you remove an adhesive bandage, be sure not to damage the skin around it.  If you have a wound, look at it several times a day to make sure it is healing.  Do not use heating pads or hot water bottles. They may burn your skin. If you have lost feeling in your feet or legs, you may not know it is happening until it is too late.  Make sure your health care provider performs a complete foot exam at least annually or more often if you have foot problems. Report any cuts, sores, or bruises to your health care provider immediately. SEEK MEDICAL CARE IF:   You have an injury that is not healing.  You have cuts or breaks in the skin.  You have an ingrown nail.  You notice redness on your legs or feet.  You feel burning or tingling in your legs or feet.  You have pain or cramps in your legs and feet.  Your legs or feet are numb.  Your feet always feel cold. SEEK IMMEDIATE MEDICAL CARE IF:   There is increasing redness,   swelling, or pain in or around a wound.  There is a red line that goes up your leg.  Pus is coming from a wound.  You develop a fever or as directed by your health care provider.  You notice a bad smell coming from an ulcer or wound.   This information is not intended to replace advice given to you by your health care provider. Make sure you discuss any questions you have with your health care provider.   Document Released: 10/13/2000 Document Revised: 06/18/2013 Document Reviewed: 03/25/2013 Elsevier Interactive Patient Education 2016 [employer]  

## 2016-03-25 NOTE — ED Notes (Signed)
Patient states that she had a cast put to her left foot on Wed. Since she has noticed increased swelling and the cast is cutting her left foot. Patient reports that it is also very painful

## 2016-03-25 NOTE — ED Notes (Signed)
Pt given d/c instructions as per chart. Verbalizes understanding. No questions. 
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
## 2016-04-06 ENCOUNTER — Ambulatory Visit (HOSPITAL_COMMUNITY): Payer: BLUE CROSS/BLUE SHIELD | Attending: Cardiovascular Disease

## 2016-04-06 ENCOUNTER — Other Ambulatory Visit: Payer: Self-pay

## 2016-04-06 DIAGNOSIS — E785 Hyperlipidemia, unspecified: Secondary | ICD-10-CM | POA: Insufficient documentation

## 2016-04-06 DIAGNOSIS — I1310 Hypertensive heart and chronic kidney disease without heart failure, with stage 1 through stage 4 chronic kidney disease, or unspecified chronic kidney disease: Secondary | ICD-10-CM | POA: Diagnosis not present

## 2016-04-06 DIAGNOSIS — R079 Chest pain, unspecified: Secondary | ICD-10-CM | POA: Insufficient documentation

## 2016-04-06 DIAGNOSIS — N189 Chronic kidney disease, unspecified: Secondary | ICD-10-CM | POA: Diagnosis not present

## 2016-04-06 DIAGNOSIS — E1122 Type 2 diabetes mellitus with diabetic chronic kidney disease: Secondary | ICD-10-CM | POA: Insufficient documentation

## 2016-04-06 LAB — ECHOCARDIOGRAM COMPLETE
AVLVOTPG: 5 mmHg
CHL CUP DOP CALC LVOT VTI: 22.6 cm
E decel time: 197 msec
EERAT: 8.06
FS: 31 % (ref 28–44)
IV/PV OW: 1.06
LA ID, A-P, ES: 41 mm
LA diam end sys: 41 mm
LA vol A4C: 35 ml
LA vol index: 15.5 mL/m2
LADIAMINDEX: 2.05 cm/m2
LAVOL: 31 mL
LV E/e' medial: 8.06
LV E/e'average: 8.06
LV TDI E'LATERAL: 11.7
LVELAT: 11.7 cm/s
LVOT area: 2.84 cm2
LVOT diameter: 19 mm
LVOTPV: 113 cm/s
LVOTSV: 64 mL
MV Dec: 197
MV Peak grad: 4 mmHg
MV pk A vel: 108 m/s
MVPKEVEL: 94.3 m/s
PW: 11.4 mm — AB (ref 0.6–1.1)
TDI e' medial: 8.22

## 2016-04-17 ENCOUNTER — Telehealth: Payer: Self-pay | Admitting: Family Medicine

## 2016-04-17 DIAGNOSIS — R079 Chest pain, unspecified: Secondary | ICD-10-CM

## 2016-04-17 DIAGNOSIS — G894 Chronic pain syndrome: Secondary | ICD-10-CM

## 2016-04-17 NOTE — Telephone Encounter (Signed)
Can be reached: [phone]   Reason for call: pt called for refill on oxycodone. She has 3 left and typically takes 3/day. Ok to pick up tomorrow.

## 2016-04-18 MED ORDER — OXYCODONE-ACETAMINOPHEN 5-325 MG PO TABS: 1.0000 | ORAL_TABLET | ORAL | Status: DC | PRN

## 2016-04-18 NOTE — Telephone Encounter (Signed)
refil x1

## 2016-04-18 NOTE — Telephone Encounter (Signed)
VM left making the patient aware the Rx is ready for pick up.     KP

## 2016-04-18 NOTE — Telephone Encounter (Signed)
Last seen and filled 03/17/16 #90  UDS 07/08/15 low risk   Please advise    KP

## 2016-04-25 ENCOUNTER — Other Ambulatory Visit: Payer: Self-pay | Admitting: Family Medicine

## 2016-04-28 ENCOUNTER — Encounter: Payer: Self-pay | Admitting: Internal Medicine

## 2016-04-28 ENCOUNTER — Ambulatory Visit (INDEPENDENT_AMBULATORY_CARE_PROVIDER_SITE_OTHER): Payer: BLUE CROSS/BLUE SHIELD | Admitting: Internal Medicine

## 2016-04-28 VITALS — BP 122/70 | HR 75 | Ht 62.0 in | Wt 192.0 lb

## 2016-04-28 DIAGNOSIS — IMO0002 Reserved for concepts with insufficient information to code with codable children: Secondary | ICD-10-CM

## 2016-04-28 DIAGNOSIS — E1165 Type 2 diabetes mellitus with hyperglycemia: Secondary | ICD-10-CM | POA: Diagnosis not present

## 2016-04-28 DIAGNOSIS — E1142 Type 2 diabetes mellitus with diabetic polyneuropathy: Secondary | ICD-10-CM | POA: Diagnosis not present

## 2016-04-28 LAB — POCT GLYCOSYLATED HEMOGLOBIN (HGB A1C): HEMOGLOBIN A1C: 11

## 2016-04-28 MED ORDER — DULAGLUTIDE 0.75 MG/0.5ML ~~LOC~~ SOAJ: SUBCUTANEOUS | Status: DC

## 2016-04-28 MED ORDER — INSULIN GLULISINE 100 UNIT/ML SOLOSTAR PEN: PEN_INJECTOR | SUBCUTANEOUS | Status: DC

## 2016-04-28 NOTE — Patient Instructions (Signed)
Please continue Lantus 40 units at bedtime.  Please continue Apidra 15 units 2x a day, before b'fast and before dinner.  Start Trulicity 2.20 mg weekly under skin.  Please return in 1.5 months with your sugar log.   Please let me know if the sugars are consistently <80 or >200.  PATIENT INSTRUCTIONS FOR TYPE 2 DIABETES:  **Please join MyChart!** - see attached instructions about how to join if you have not done so already.  DIET AND EXERCISE Diet and exercise is an important part of diabetic treatment.  We recommended aerobic exercise in the form of brisk walking (working between 40-60% of maximal aerobic capacity, similar to brisk walking) for 150 minutes per week (such as 30 minutes five days per week) along with 3 times per week performing 'resistance' training (using various gauge rubber tubes with handles) 5-10 exercises involving the major muscle groups (upper body, lower body and core) performing 10-15 repetitions (or near fatigue) each exercise. Start at half the above goal but build slowly to reach the above goals. If limited by weight, joint pain, or disability, we recommend daily walking in a swimming pool with water up to waist to reduce pressure from joints while allow for adequate exercise.    BLOOD GLUCOSES Monitoring your blood glucoses is important for continued management of your diabetes. Please check your blood glucoses 2-4 times a day: fasting, before meals and at bedtime (you can rotate these measurements - e.g. one day check before the 3 meals, the next day check before 2 of the meals and before bedtime, etc.).   HYPOGLYCEMIA (low blood sugar) Hypoglycemia is usually a reaction to not eating, exercising, or taking too much insulin/ other diabetes drugs.  Symptoms include tremors, sweating, hunger, confusion, headache, etc. Treat IMMEDIATELY with 15 grams of Carbs: . 4 glucose tablets .  cup regular juice/soda . 2 tablespoons raisins . 4 teaspoons sugar . 1  tablespoon honey Recheck blood glucose in 15 mins and repeat above if still symptomatic/blood glucose <100.  RECOMMENDATIONS TO REDUCE YOUR RISK OF DIABETIC COMPLICATIONS: * Take your prescribed MEDICATION(S) * Follow a DIABETIC diet: Complex carbs, fiber rich foods, (monounsaturated and polyunsaturated) fats * AVOID saturated/trans fats, high fat foods, >2,300 mg salt per day. * EXERCISE at least 5 times a week for 30 minutes or preferably daily.  * DO NOT SMOKE OR DRINK more than 1 drink a day. * Check your FEET every day. Do not wear tightfitting shoes. Contact us if you develop an ulcer * See your EYE doctor once a year or more if needed * Get a FLU shot once a year * Get a PNEUMONIA vaccine once before and once after age 74 years  GOALS:  * Your Hemoglobin A1c of <7%  * fasting sugars need to be <130 * after meals sugars need to be <180 (2h after you start eating) * Your Systolic BP should be 254 or lower  * Your Diastolic BP should be 80 or lower  * Your HDL (Good Cholesterol) should be 40 or higher  * Your LDL (Bad Cholesterol) should be 100 or lower. * Your Triglycerides should be 150 or lower  * Your Urine microalbumin (kidney function) should be <30 * Your Body Mass Index should be 25 or lower    Please consider the following ways to cut down carbs and fat and increase fiber and micronutrients in your diet: - substitute whole grain for white bread or pasta - substitute brown rice for white rice -  substitute 90-calorie flat bread pieces for slices of bread when possible - substitute sweet potatoes or yams for white potatoes - substitute humus for margarine - substitute tofu for cheese when possible - substitute almond or rice milk for regular milk (would not drink soy milk daily due to concern for soy estrogen influence on breast cancer risk) - substitute dark chocolate for other sweets when possible - substitute water - can add lemon or orange slices for taste - for diet  sodas (artificial sweeteners will trick your body that you can eat sweets without getting calories and will lead you to overeating and weight gain in the long run) - do not skip breakfast or other meals (this will slow down the metabolism and will result in more weight gain over time)  - can try smoothies made from fruit and almond/rice milk in am instead of regular breakfast - can also try old-fashioned (not instant) oatmeal made with almond/rice milk in am - order the dressing on the side when eating salad at a restaurant (pour less than half of the dressing on the salad) - eat as little meat as possible - can try juicing, but should not forget that juicing will get rid of the fiber, so would alternate with eating raw veg./fruits or drinking smoothies - use as little oil as possible, even when using olive oil - can dress a salad with a mix of balsamic vinegar and lemon juice, for e.g. - use agave nectar, stevia sugar, or regular sugar rather than artificial sweateners - steam or broil/roast veggies  - snack on veggies/fruit/nuts (unsalted, preferably) when possible, rather than processed foods - reduce or eliminate aspartame in diet (it is in diet sodas, chewing gum, etc) Read the labels!  Try to read Dr. Janene Harvey book: "Program for Reversing Diabetes" for other ideas for healthy eating.
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
## 2016-05-04 ENCOUNTER — Ambulatory Visit: Payer: BLUE CROSS/BLUE SHIELD | Admitting: Cardiology

## 2016-05-07 ENCOUNTER — Other Ambulatory Visit: Payer: Self-pay | Admitting: Family Medicine

## 2016-05-11 ENCOUNTER — Encounter: Payer: Self-pay | Admitting: Cardiology

## 2016-05-16 ENCOUNTER — Telehealth: Payer: Self-pay | Admitting: Family Medicine

## 2016-05-16 ENCOUNTER — Encounter: Payer: BLUE CROSS/BLUE SHIELD | Admitting: Internal Medicine

## 2016-05-16 DIAGNOSIS — R079 Chest pain, unspecified: Secondary | ICD-10-CM

## 2016-05-16 DIAGNOSIS — G894 Chronic pain syndrome: Secondary | ICD-10-CM

## 2016-05-16 MED ORDER — OXYCODONE-ACETAMINOPHEN 5-325 MG PO TABS: 1.0000 | ORAL_TABLET | ORAL | Status: DC | PRN

## 2016-05-16 NOTE — Telephone Encounter (Signed)
Refill x1 

## 2016-05-16 NOTE — Telephone Encounter (Signed)
Relation to EN:IDPO Call back number:[phone]   Reason for call:  Patient requesting a refill oxyCODONE-acetaminophen (PERCOCET/ROXICET) 5-325 MG tablet

## 2016-05-16 NOTE — Telephone Encounter (Signed)
Last seen 03/17/16 and filled 04/18/16 #90 UDS 07/08/15 low risk   Please advise    KP

## 2016-05-16 NOTE — Telephone Encounter (Signed)
I tried to call the patient and leave a message twice but both times the line pick up and hangs up. Unable to leave a message, Rx left at check in.     KP

## 2016-06-19 ENCOUNTER — Telehealth: Payer: Self-pay | Admitting: Family Medicine

## 2016-06-19 DIAGNOSIS — R079 Chest pain, unspecified: Secondary | ICD-10-CM

## 2016-06-19 DIAGNOSIS — G894 Chronic pain syndrome: Secondary | ICD-10-CM

## 2016-06-19 NOTE — Telephone Encounter (Signed)
Refill x1 

## 2016-06-19 NOTE — Telephone Encounter (Signed)
Last seen 03/17/16 and filled 05/16/16 #90 UDS 07/08/15     Please advise    KP

## 2016-06-19 NOTE — Telephone Encounter (Signed)
Relation to KT:GYBW Call back number:[phone]   Reason for call:  Patient requesting a refill oxyCODONE-acetaminophen (PERCOCET/ROXICET) 5-325 MG tablet

## 2016-06-20 MED ORDER — OXYCODONE-ACETAMINOPHEN 5-325 MG PO TABS: 1.0000 | ORAL_TABLET | ORAL | 0 refills | Status: DC | PRN

## 2016-06-20 NOTE — Telephone Encounter (Signed)
Vm left advising Rx ready for pick up.    KP

## 2016-06-27 ENCOUNTER — Encounter: Payer: Self-pay | Admitting: Internal Medicine

## 2016-06-27 ENCOUNTER — Ambulatory Visit (INDEPENDENT_AMBULATORY_CARE_PROVIDER_SITE_OTHER): Payer: BLUE CROSS/BLUE SHIELD | Admitting: Internal Medicine

## 2016-06-27 VITALS — BP 132/82 | HR 84 | Ht 62.0 in | Wt 194.0 lb

## 2016-06-27 DIAGNOSIS — E1165 Type 2 diabetes mellitus with hyperglycemia: Secondary | ICD-10-CM | POA: Diagnosis not present

## 2016-06-27 DIAGNOSIS — IMO0002 Reserved for concepts with insufficient information to code with codable children: Secondary | ICD-10-CM

## 2016-06-27 DIAGNOSIS — E1142 Type 2 diabetes mellitus with diabetic polyneuropathy: Secondary | ICD-10-CM

## 2016-06-27 MED ORDER — DULAGLUTIDE 1.5 MG/0.5ML ~~LOC~~ SOAJ
SUBCUTANEOUS | 5 refills | Status: DC
Start: 2016-06-27 — End: 2017-04-06

## 2016-06-27 MED ORDER — INSULIN GLULISINE 100 UNIT/ML SOLOSTAR PEN: PEN_INJECTOR | SUBCUTANEOUS | 2 refills | Status: DC
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
## 2016-06-27 NOTE — Patient Instructions (Addendum)
Please continue: - Lantus 40 units at bedtime  Please decrease  - Apidra to:  15 units before b'fast   10 units before dinner  Please increase - Trulicity to 1.5 mg weekly  Please return in 2 months with your sugar log.

## 2016-07-07 ENCOUNTER — Ambulatory Visit (INDEPENDENT_AMBULATORY_CARE_PROVIDER_SITE_OTHER): Payer: BLUE CROSS/BLUE SHIELD | Admitting: Family Medicine

## 2016-07-07 ENCOUNTER — Encounter: Payer: Self-pay | Admitting: Family Medicine

## 2016-07-07 VITALS — BP 137/84 | HR 83 | Temp 98.8°F | Ht 62.0 in | Wt 195.0 lb

## 2016-07-07 DIAGNOSIS — R82998 Other abnormal findings in urine: Secondary | ICD-10-CM

## 2016-07-07 DIAGNOSIS — N390 Urinary tract infection, site not specified: Secondary | ICD-10-CM | POA: Diagnosis not present

## 2016-07-07 DIAGNOSIS — R109 Unspecified abdominal pain: Secondary | ICD-10-CM

## 2016-07-07 DIAGNOSIS — R319 Hematuria, unspecified: Secondary | ICD-10-CM | POA: Diagnosis not present

## 2016-07-07 DIAGNOSIS — H6500 Acute serous otitis media, unspecified ear: Secondary | ICD-10-CM

## 2016-07-07 LAB — POC URINALSYSI DIPSTICK (AUTOMATED)
Bilirubin, UA: NEGATIVE
Glucose, UA: NEGATIVE
KETONES UA: NEGATIVE
Nitrite, UA: NEGATIVE
PH UA: 6
PROTEIN UA: NEGATIVE
SPEC GRAV UA: 1.025
UROBILINOGEN UA: 2

## 2016-07-07 MED ORDER — SULFAMETHOXAZOLE-TRIMETHOPRIM 800-160 MG PO TABS: 1.0000 | ORAL_TABLET | Freq: Two times a day (BID) | ORAL | 0 refills | Status: DC

## 2016-07-07 MED ORDER — ACETIC ACID 2 % OT SOLN: 4.0000 [drp] | Freq: Three times a day (TID) | OTIC | 0 refills | Status: DC

## 2016-07-07 NOTE — Progress Notes (Signed)
Pre visit review using our clinic review tool, if applicable. No additional management support is needed unless otherwise documented below in the visit note. 

## 2016-07-07 NOTE — Patient Instructions (Signed)
Serous Otitis Media Serous otitis media is fluid in the middle ear space. This space contains the bones for hearing and air. Air in the middle ear space helps to transmit sound.  The air gets there through the eustachian tube. This tube goes from the back of the nose (nasopharynx) to the middle ear space. It keeps the pressure in the middle ear the same as the outside world. It also helps to drain fluid from the middle ear space. CAUSES  Serous otitis media occurs when the eustachian tube gets blocked. Blockage can come from:  Ear infections.  Colds and other upper respiratory infections.  Allergies.  Irritants such as cigarette smoke.  Sudden changes in air pressure (such as descending in an airplane).  Enlarged adenoids.  A mass in the nasopharynx. During colds and upper respiratory infections, the middle ear space can become temporarily filled with fluid. This can happen after an ear infection also. Once the infection clears, the fluid will generally drain out of the ear through the eustachian tube. If it does not, then serous otitis media occurs. SIGNS AND SYMPTOMS   Hearing loss.  A feeling of fullness in the ear, without pain.  Young children may not show any symptoms but may show slight behavioral changes, such as agitation, ear pulling, or crying. DIAGNOSIS  Serous otitis media is diagnosed by an ear exam. Tests may be done to check on the movement of the eardrum. Hearing exams may also be done. TREATMENT  The fluid most often goes away without treatment. If allergy is the cause, allergy treatment may be helpful. Fluid that persists for several months may require minor surgery. A small tube is placed in the eardrum to:  Drain the fluid.  Restore the air in the middle ear space. In certain situations, antibiotic medicines are used to avoid surgery. Surgery may be done to remove enlarged adenoids (if this is the cause). HOME CARE INSTRUCTIONS   Keep children away from  tobacco smoke.  Keep all follow-up visits as directed by your health care provider. SEEK MEDICAL CARE IF:   Your hearing is not better in 3 months.  Your hearing is worse.  You have ear pain.  You have drainage from the ear.  You have dizziness.  You have serous otitis media only in one ear or have any bleeding from your nose (epistaxis).  You notice a lump on your neck. MAKE SURE YOU:  Understand these instructions.   Will watch your condition.   Will get help right away if you are not doing well or get worse.    This information is not intended to replace advice given to you by your health care provider. Make sure you discuss any questions you have with your health care provider.   Document Released: 01/06/2004 Document Revised: 11/06/2014 Document Reviewed: 05/13/2013 Elsevier Interactive Patient Education [employer].

## 2016-07-07 NOTE — Progress Notes (Signed)
[name], female    DOB: Feb 26, 1956  Age: 60 y.o. MRN: [id number]    Subjective:  Subjective  HPI Maggie R Scheiderer-Reed presents for flank pain b/l R >L and odor in urine.  She is also c/o ear pressure/ itching and pain.   No congestion, fever etc.     Review of Systems  Constitutional: Negative for appetite change, diaphoresis, fatigue and unexpected weight change.  HENT: Positive for ear pain and postnasal drip. Negative for congestion and sinus pressure.   Eyes: Negative for pain, redness and visual disturbance.  Respiratory: Negative for cough, chest tightness, shortness of breath and wheezing.   Cardiovascular: Negative for chest pain, palpitations and leg swelling.  Endocrine: Negative for cold intolerance, heat intolerance, polydipsia, polyphagia and polyuria.  Genitourinary: Positive for frequency. Negative for difficulty urinating, dysuria, vaginal discharge and vaginal pain.  Neurological: Negative for dizziness, light-headedness, numbness and headaches.    History Past Medical History:  Diagnosis Date  . Acute MI [facility]) 2007   presented to ED & had cardiac cath- but found to have normal coronaries. Since that point in time her PCP cares f or cardiac needs. Dr. [name] - [facility]  . Anemia   . Anginal pain ([place])   . Anxiety   . 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 Cirrhosis of liver without mention of alcohol   . Diabetes mellitus    insulin dependent, adult onset  . Exertional shortness of breath   . Fatty liver   . Fibromyalgia   . GERD (gastroesophageal reflux disease)   . Hepatic steatosis   . High cholesterol   . Hypertension   . Osteoarthritis    hands, hips  . Proximal humerus fracture 10/15/12   Left  . PTSD (post-traumatic stress disorder)   . 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   Her family history includes Cancer in her father; Colitis in her father; Crohn's disease in her father; Diabetes in her brother, father, and mother; Heart disease in her father; Heart disease (age of onset: 48) in her brother; Hypertension in her mother; Irritable bowel syndrome in her daughter; Kidney disease in her brother; Kidney failure in her brother; Leukemia in her father; Mental illness in her mother.She reports that she has never smoked. She has never used smokeless tobacco. She reports that she does not drink alcohol or use drugs.  Current Outpatient Prescriptions on File Prior to Visit  Medication Sig Dispense Refill  . atorvastatin (LIPITOR) 10 MG tablet Take 1 tablet (10 mg total) by mouth daily. 30 tablet 5  . Calcium Carbonate-Vitamin D (CALTRATE 600+D) 600-400 MG-UNIT per tablet Take 1 tablet by mouth daily.     . Cholecalciferol (VITAMIN D3) 5000 UNITS CAPS Take 1 capsule by mouth 2 (two) times daily.    [name] Kitchen CINNAMON PO Take 1,000 mg by mouth daily.    . Dulaglutide (TRULICITY) 1.5 KD/9.8PJ SOPN Inject 1.5 mg weekly 4 pen 5  . DULoxetine (CYMBALTA) 60 MG capsule TAKE ONE CAPSULE BY MOUTH ONCE DAILY 30 capsule 5  . ENSURE  PLUS (ENSURE PLUS) LIQD Take 237 mLs by mouth.    . fenofibrate 160 MG tablet Take 1 tablet (160 mg total) by mouth daily. 30 tablet 5  . ferrous sulfate (SLOW FE) 160 (50 FE) MG TBCR SR tablet Take 160 mg by mouth daily.    . Flaxseed, Linseed, (FLAX SEED OIL) 1000 MG CAPS Take 1 capsule by mouth at bedtime.    . fluticasone (FLONASE) 50 MCG/ACT nasal spray Place 2 sprays into both nostrils daily. 16 g 1   . hydrochlorothiazide (HYDRODIURIL) 25 MG tablet Take 1 tablet (25 mg total) by mouth daily. 30 tablet 11  . Insulin Glulisine (APIDRA SOLOSTAR) 100 UNIT/ML Solostar Pen INJECT 15 UNITS SUBCUTANEOUSLY in am and 10 units before dinner 15 mL 2  . LANTUS SOLOSTAR 100 UNIT/ML Solostar Pen INJECT 40 UNITS SUBCUTANEOUSLY ONCE DAILY AT BEDTIME 15 pen 2  . levocetirizine (XYZAL) 5 MG tablet Take 1 tablet (5 mg total) by mouth every evening. 30 tablet 0  . lisinopril (PRINIVIL,ZESTRIL) 5 MG tablet Take 1 tablet (5 mg total) by mouth daily. 30 tablet 5  . LYRICA 150 MG capsule TAKE ONE CAPSULE BY MOUTH THREE TIMES DAILY AS NEEDED 90 capsule 5  . Multiple Vitamin (MULTIVITAMIN) tablet Take 1 tablet by mouth daily.     . ondansetron (ZOFRAN ODT) 8 MG disintegrating tablet Take 1 tablet (8 mg total) by mouth every 8 (eight) hours as needed for nausea or vomiting. 6 tablet 0  . oxyCODONE-acetaminophen (PERCOCET/ROXICET) 5-325 MG tablet Take 1-2 tablets by mouth every 4 (four) hours as needed for severe pain. 90 tablet 0  . polyethylene glycol (MIRALAX / GLYCOLAX) packet Take 17 g by mouth daily.      . Probiotic Product (PROBIOTIC-10) CHEW Chew by mouth 2 (two) times daily.    . traMADol (ULTRAM) 50 MG tablet Take 50 mg by mouth.    . vitamin C (ASCORBIC ACID) 500 MG tablet Take 500 mg by mouth daily.    . [DISCONTINUED] metFORMIN (GLUCOPHAGE) 1000 MG tablet Take 1,000 mg by mouth 2 (two) times daily with a meal.      . [DISCONTINUED] omeprazole (PRILOSEC) 20 MG capsule Take 20 mg by mouth daily.       No current facility-administered medications on file prior to visit.      Objective:  Objective  Physical Exam  Constitutional: She is oriented to person, place, and time. She appears well-developed and well-nourished.  HENT:  Head: Normocephalic and atraumatic.  Right Ear: A middle ear effusion is present.  Left Ear: A middle ear effusion is present.  Ears:  Eyes: Conjunctivae and EOM are normal.    Neck: Normal range of motion. Neck supple. No JVD present. Carotid bruit is not present. No thyromegaly present.  Cardiovascular: Normal rate, regular rhythm and normal heart sounds.   No murmur heard. Pulmonary/Chest: Effort normal and breath sounds normal. No respiratory distress. She has no wheezes. She has no rales. She exhibits no tenderness.  Musculoskeletal: She exhibits no edema.  Neurological: She is alert and oriented to person, place, and time.  Psychiatric: She has a normal mood and affect.  Nursing note and vitals reviewed.  BP 137/84 (BP Location: Left Arm, Patient Position: Sitting, Cuff Size: Normal)   Pulse 83   Temp 98.8 F (37.1 C) (Oral)   Ht 5' 2"  (1.575 m)   Wt 195 lb (88.5 kg)   SpO2 98%   BMI 35.67 kg/m  Wt Readings from Last 3 Encounters:  07/07/16 195 lb (88.5 kg)  06/27/16 194 lb (88 kg)  04/28/16 192 lb (87.1 kg)     Lab Results  Component Value Date   WBC 5.6 02/26/2016   HGB 10.9 (L) 02/26/2016   HCT 33.6 (L) 02/26/2016   PLT 139 (L) 02/26/2016   GLUCOSE 328 (H) 02/26/2016   CHOL 129 01/03/2016   TRIG 110.0 01/03/2016   HDL 37.50 (L) 01/03/2016   LDLDIRECT 115.8 01/23/2012   LDLCALC 70 01/03/2016   ALT 21 02/26/2016   AST 25 02/26/2016   NA 136 02/26/2016   K 5.0 02/26/2016   CL 106 02/26/2016   CREATININE 1.69 (H) 02/26/2016   BUN 29 (H) 02/26/2016   CO2 23 02/26/2016   TSH 1.65 01/03/2016   INR 1.44 03/21/2013   HGBA1C 11.0 04/28/2016   MICROALBUR 3.1 (H) 08/12/2015    No results found.   Assessment & Plan:  Plan  I have discontinued Ms. Purk-Reed's sulfamethoxazole-trimethoprim. I am also having her start on sulfamethoxazole-trimethoprim and acetic acid. Additionally, I am having her maintain her Calcium Carbonate-Vitamin D, multivitamin, polyethylene glycol, CINNAMON PO, Vitamin D3, Flax Seed Oil, lisinopril, ondansetron, ENSURE PLUS, vitamin C, ferrous sulfate, levocetirizine, fluticasone, LYRICA, hydrochlorothiazide,  fenofibrate, atorvastatin, LANTUS SOLOSTAR, traMADol, PROBIOTIC-10, DULoxetine, oxyCODONE-acetaminophen, Insulin Glulisine, and Dulaglutide.  Meds ordered this encounter  Medications  . sulfamethoxazole-trimethoprim (BACTRIM DS,SEPTRA DS) 800-160 MG tablet    Sig: Take 1 tablet by mouth 2 (two) times daily.    Dispense:  14 tablet    Refill:  0  . acetic acid (VOSOL) 2 % otic solution    Sig: Place 4 drops into both ears 3 (three) times daily.    Dispense:  15 mL    Refill:  0    Problem List Items Addressed This Visit    None    Visit Diagnoses    Leukocytes in urine    -  Primary   Relevant Medications   sulfamethoxazole-trimethoprim (BACTRIM DS,SEPTRA DS) 800-160 MG tablet   Other Relevant Orders   POCT Urinalysis Dipstick (Automated) (Completed)   Urine Culture (Completed)   Hematuria       Relevant Medications   sulfamethoxazole-trimethoprim (BACTRIM DS,SEPTRA DS) 800-160 MG tablet   Other Relevant Orders   POCT Urinalysis Dipstick (Automated) (Completed)   Urine Culture (Completed)   Flank pain       Relevant Orders   CBC with Differential/Platelet   Comprehensive metabolic panel   Acute serous otitis media, recurrence not specified, unspecified laterality       Relevant Medications   sulfamethoxazole-trimethoprim (BACTRIM DS,SEPTRA DS) 800-160 MG tablet   acetic acid (VOSOL) 2 % otic solution      Follow-up: Return if symptoms worsen or fail to improve.  Ann Held, DO

## 2016-07-09 LAB — URINE CULTURE: Organism ID, Bacteria: 10000

## 2016-07-18 ENCOUNTER — Telehealth: Payer: Self-pay | Admitting: Family Medicine

## 2016-07-18 DIAGNOSIS — R079 Chest pain, unspecified: Secondary | ICD-10-CM

## 2016-07-18 DIAGNOSIS — G894 Chronic pain syndrome: Secondary | ICD-10-CM

## 2016-07-18 MED ORDER — OXYCODONE-ACETAMINOPHEN 5-325 MG PO TABS: 1.0000 | ORAL_TABLET | ORAL | 0 refills | Status: DC | PRN

## 2016-07-18 NOTE — Telephone Encounter (Signed)
Refill x1 

## 2016-07-18 NOTE — Telephone Encounter (Signed)
Patient aware the medication will be ready for pick up on Thursday.    KP

## 2016-07-18 NOTE — Telephone Encounter (Signed)
°  Relation to QW:QVLD Call back number:[phone]   Reason for call:  Patient requesting a refill oxyCODONE-acetaminophen (PERCOCET/ROXICET) 5-325 MG tablet

## 2016-07-18 NOTE — Telephone Encounter (Signed)
Last seen 07/07/16 and filled 06/20/2016 #90  UDS 07/08/15 low risk   Please advise    KP

## 2016-08-01 ENCOUNTER — Ambulatory Visit (INDEPENDENT_AMBULATORY_CARE_PROVIDER_SITE_OTHER): Payer: BLUE CROSS/BLUE SHIELD | Admitting: Orthopedic Surgery

## 2016-08-01 DIAGNOSIS — E1142 Type 2 diabetes mellitus with diabetic polyneuropathy: Secondary | ICD-10-CM

## 2016-08-01 DIAGNOSIS — L97421 Non-pressure chronic ulcer of left heel and midfoot limited to breakdown of skin: Secondary | ICD-10-CM

## 2016-08-01 DIAGNOSIS — M5441 Lumbago with sciatica, right side: Secondary | ICD-10-CM

## 2016-08-03 ENCOUNTER — Other Ambulatory Visit (INDEPENDENT_AMBULATORY_CARE_PROVIDER_SITE_OTHER): Payer: Self-pay | Admitting: Orthopedic Surgery

## 2016-08-03 DIAGNOSIS — M545 Low back pain, unspecified: Secondary | ICD-10-CM

## 2016-08-03 DIAGNOSIS — M48061 Spinal stenosis, lumbar region without neurogenic claudication: Secondary | ICD-10-CM

## 2016-08-04 ENCOUNTER — Telehealth: Payer: Self-pay | Admitting: Family Medicine

## 2016-08-04 NOTE — Telephone Encounter (Signed)
Relation to VH:OYWV Call back [place]:  Reason for call:  Patient requesting orders for pneumonia injection. Patient is coming in Tuesday 08/08/16 to receive flu shot and would like pneumonia injection as well.

## 2016-08-04 NOTE — Telephone Encounter (Signed)
He is not due for Pneumonia.     KP

## 2016-08-07 NOTE — Telephone Encounter (Signed)
lvm informing patient of message below.

## 2016-08-07 NOTE — Telephone Encounter (Signed)
Patient aware that she does not need the pneumonia vaccine. Patient is scheduled to get her flu shot.

## 2016-08-08 ENCOUNTER — Ambulatory Visit: Payer: BLUE CROSS/BLUE SHIELD

## 2016-08-14 ENCOUNTER — Ambulatory Visit (INDEPENDENT_AMBULATORY_CARE_PROVIDER_SITE_OTHER): Payer: BLUE CROSS/BLUE SHIELD

## 2016-08-14 ENCOUNTER — Ambulatory Visit
Admission: RE | Admit: 2016-08-14 | Discharge: 2016-08-14 | Disposition: A | Discharge status: Home / Self Care | Payer: BLUE CROSS/BLUE SHIELD | Source: Ambulatory Visit | Attending: Orthopedic Surgery | Admitting: Orthopedic Surgery

## 2016-08-14 DIAGNOSIS — Z23 Encounter for immunization: Secondary | ICD-10-CM | POA: Diagnosis not present

## 2016-08-14 DIAGNOSIS — M48061 Spinal stenosis, lumbar region without neurogenic claudication: Secondary | ICD-10-CM

## 2016-08-14 DIAGNOSIS — M545 Low back pain, unspecified: Secondary | ICD-10-CM

## 2016-08-21 ENCOUNTER — Telehealth: Payer: Self-pay | Admitting: Family Medicine

## 2016-08-21 DIAGNOSIS — G894 Chronic pain syndrome: Secondary | ICD-10-CM

## 2016-08-21 MED ORDER — OXYCODONE-ACETAMINOPHEN 5-325 MG PO TABS: 1.0000 | ORAL_TABLET | ORAL | 0 refills | Status: DC | PRN

## 2016-08-21 NOTE — Telephone Encounter (Signed)
Refill x1 

## 2016-08-21 NOTE — Telephone Encounter (Signed)
Message left advising Rx is ready for pick up.    KP

## 2016-08-21 NOTE — Telephone Encounter (Signed)
Caller name: Relationship to patient: Self Can be reached: [phone] Pharmacy:  Reason for call: Request refill on oxyCODONE-acetaminophen (PERCOCET/ROXICET) 5-325 MG tablet [[id number]]

## 2016-08-21 NOTE — Telephone Encounter (Signed)
Last seen 07/07/16 and filled 07/18/16 #90 UDS 07/19/15--due now   Please advise    KP

## 2016-08-22 ENCOUNTER — Ambulatory Visit (INDEPENDENT_AMBULATORY_CARE_PROVIDER_SITE_OTHER): Payer: BLUE CROSS/BLUE SHIELD | Admitting: Orthopedic Surgery

## 2016-08-28 ENCOUNTER — Ambulatory Visit (INDEPENDENT_AMBULATORY_CARE_PROVIDER_SITE_OTHER): Payer: BLUE CROSS/BLUE SHIELD | Admitting: Physical Medicine and Rehabilitation

## 2016-08-28 ENCOUNTER — Encounter (INDEPENDENT_AMBULATORY_CARE_PROVIDER_SITE_OTHER): Payer: Self-pay | Admitting: Physical Medicine and Rehabilitation

## 2016-08-28 VITALS — BP 112/68 | HR 84 | Temp 98.1°F

## 2016-08-28 DIAGNOSIS — M5416 Radiculopathy, lumbar region: Secondary | ICD-10-CM | POA: Diagnosis not present

## 2016-08-28 DIAGNOSIS — M5116 Intervertebral disc disorders with radiculopathy, lumbar region: Secondary | ICD-10-CM

## 2016-08-28 MED ORDER — LIDOCAINE HCL (PF) 1 % IJ SOLN
0.3300 mL | Freq: Once | INTRAMUSCULAR | Status: AC
  Administered 2016-08-28: 0.3 mL

## 2016-08-28 MED ORDER — METHYLPREDNISOLONE ACETATE 80 MG/ML IJ SUSP
80.0000 mg | Freq: Once | INTRAMUSCULAR | Status: AC
  Administered 2016-08-28: 80 mg

## 2016-08-28 NOTE — Patient Instructions (Signed)
[facility] Discharge Instructions  *At any time if you have questions or concerns they can be answered by calling [phone]  All Patients: . You may experience an increase in your symptoms for the first 2 days (it can take 2 days to 2 weeks for the steroid/cortisone to have its maximal effect). . You may use ice to the site for the first 24 hours; 20 minutes on and 20 minutes off and may use heat after that time. . You may resume and continue your current pain medications. If you need a refill please contact the prescribing physician. . You may resume her regular medications if any were stopped for the procedure. . You may shower but no swimming, tub bath or Jacuzzi for 24 hours. . Please remove bandage after 4 hours. . You may resume light activities as tolerated. . You should not drive for the next 3 hours due to anesthetics used in the procedure. Please have someone drive for you.  *If you have had sedation, Valium, Xanax, or lorazepam: Do not drive or use public transportation for 24 hours, do not operating hazardous machinery or make important personal/business decisions for 24 hours.  POSSIBLE STEROID SIDE EFFECTS: If experienced these should only last for a short period. Change in menstrual flow  Edema in (swelling)  Increased appetite Skin flushing (redness)  Skin rash/acne  Thrush (oral) Vaginitis    Increased sweating  Depression Increased blood glucose levels Cramping and leg/calf  Euphoria (feeling happy)  POSSIBLE PROCEDURE SIDE EFFECTS: Please call our office if concerned. Increased pain Increased numbness/tingling  Headache Nausea/vomiting Hematoma (bruising/bleeding) Edema (swelling at the site) Weakness  Infection (red/drainage at site) Fever greater than 100.5F  *In the event of a headache after epidural steroid injection: Drink plenty of fluids, especially water and try to lay flat when possible. If the headache does not get better after a few  days or as always if concerned please call the office.  

## 2016-08-28 NOTE — Procedures (Signed)
Lumbar Epidural Steroid Injection - Interlaminar Approach with Fluoroscopic Guidance  Patient: [name] [name]      Date of Birth: 01/11/1956 MRN: [id number] PCP: Ann Held, DO      Visit Date: 08/28/2016   Universal Protocol:    Date/Time: 10/30/172:43 PM  Consent Given By: the patient  Position: PRONE  Additional Comments: Vital signs were monitored before and after the procedure. Patient was prepped and draped in the usual sterile fashion. The correct patient, procedure, and site was verified.   Injection Procedure Details:  Procedure Site One Meds Administered:  Meds ordered this encounter  Medications  . lidocaine (PF) (XYLOCAINE) 1 % injection 0.3 mL  . methylPREDNISolone acetate (DEPO-MEDROL) injection 80 mg     Laterality: Right  Location/Site:  L5-S1  Needle size: 20 G  Needle type: Tuohy  Needle Placement: Paramedian epidural  Findings:  -Contrast Used: 2 mL iohexol 180 mg iodine/mL   -Comments: Excellent flow of contrast into the epidural space.  Procedure Details: Using a paramedian approach from the side mentioned above, the region overlying the inferior lamina was localized under fluoroscopic visualization and the soft tissues overlying this structure were infiltrated with 4 ml. of 1% Lidocaine without Epinephrine. The Tuohy needle was inserted into the epidural space using a paramedian approach.   The epidural space was localized using loss of resistance along with lateral and bi-planar fluoroscopic views.  After negative aspirate for air, blood, and CSF, a 2 ml. volume of Isovue-250 was injected into the epidural space and the flow of contrast was observed. Radiographs were obtained for documentation purposes.    The injectate was administered into the level noted above.   Additional Comments:  The patient tolerated the procedure well Dressing: Band-Aid    Post-procedure details: Patient was observed during the  procedure. Post-procedure instructions were reviewed.  Patient left the clinic in stable condition.
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
## 2016-08-29 ENCOUNTER — Ambulatory Visit: Payer: BLUE CROSS/BLUE SHIELD | Admitting: Internal Medicine

## 2016-08-29 DIAGNOSIS — Z0289 Encounter for other administrative examinations: Secondary | ICD-10-CM

## 2016-09-04 ENCOUNTER — Ambulatory Visit (INDEPENDENT_AMBULATORY_CARE_PROVIDER_SITE_OTHER): Payer: BLUE CROSS/BLUE SHIELD | Admitting: Orthopedic Surgery

## 2016-09-04 ENCOUNTER — Encounter (INDEPENDENT_AMBULATORY_CARE_PROVIDER_SITE_OTHER): Payer: Self-pay | Admitting: Orthopedic Surgery

## 2016-09-04 ENCOUNTER — Ambulatory Visit (INDEPENDENT_AMBULATORY_CARE_PROVIDER_SITE_OTHER): Payer: Self-pay

## 2016-09-04 VITALS — Ht 62.0 in | Wt 195.0 lb

## 2016-09-04 DIAGNOSIS — M86172 Other acute osteomyelitis, left ankle and foot: Secondary | ICD-10-CM

## 2016-09-04 DIAGNOSIS — E1142 Type 2 diabetes mellitus with diabetic polyneuropathy: Secondary | ICD-10-CM
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
## 2016-09-07 ENCOUNTER — Encounter (HOSPITAL_COMMUNITY): Payer: Self-pay | Admitting: *Deleted

## 2016-09-07 ENCOUNTER — Other Ambulatory Visit (INDEPENDENT_AMBULATORY_CARE_PROVIDER_SITE_OTHER): Payer: Self-pay | Admitting: Orthopedic Surgery

## 2016-09-07 DIAGNOSIS — M869 Osteomyelitis, unspecified: Secondary | ICD-10-CM

## 2016-09-07 NOTE — Progress Notes (Signed)
Spoke with pt for pre-op call. Pt has hx of chest pain, had cardiac cath in 2007 and it was normal. Denies any recent chest pain or sob. Has hx of panic attacks. Pt is diabetic. Last A1C was 9.0 in August, 2017. Down from 11.0 in June. Pt states her fasting blood sugar recently have been between 118-140. Pt instructed to take 1/2 dose of her regular Lantus insulin tonight (will take 20 units) and not to take Apidra insulin in the AM. Pt instructed to check blood sugar in AM and every 2 hours until she leaves for the hospital. If blood sugar is 70 or below, treat with 1/2 cup of clear juice (apple or cranberry) and recheck blood sugar 15 minutes after drinking juice. If blood sugar continues to be 70 or below, call the Short Stay department and ask to speak to a nurse. Pt voiced understanding.

## 2016-09-08 ENCOUNTER — Encounter (HOSPITAL_COMMUNITY): Payer: Self-pay | Admitting: Surgery

## 2016-09-08 ENCOUNTER — Ambulatory Visit (HOSPITAL_COMMUNITY): Payer: BLUE CROSS/BLUE SHIELD | Admitting: Anesthesiology

## 2016-09-08 ENCOUNTER — Encounter (HOSPITAL_COMMUNITY)
Admission: RE | Disposition: A | Discharge status: Home / Self Care | Payer: Self-pay | Source: Ambulatory Visit | Attending: Orthopedic Surgery

## 2016-09-08 ENCOUNTER — Ambulatory Visit (HOSPITAL_COMMUNITY)
Admission: RE | Admit: 2016-09-08 | Discharge: 2016-09-08 | Disposition: A | Discharge status: Home / Self Care | Payer: BLUE CROSS/BLUE SHIELD | Source: Ambulatory Visit | Attending: Orthopedic Surgery | Admitting: Orthopedic Surgery

## 2016-09-08 DIAGNOSIS — K219 Gastro-esophageal reflux disease without esophagitis: Secondary | ICD-10-CM | POA: Insufficient documentation

## 2016-09-08 DIAGNOSIS — M1990 Unspecified osteoarthritis, unspecified site: Secondary | ICD-10-CM | POA: Diagnosis not present

## 2016-09-08 DIAGNOSIS — M868X6 Other osteomyelitis, lower leg: Secondary | ICD-10-CM | POA: Insufficient documentation

## 2016-09-08 DIAGNOSIS — I252 Old myocardial infarction: Secondary | ICD-10-CM | POA: Diagnosis not present

## 2016-09-08 DIAGNOSIS — E1140 Type 2 diabetes mellitus with diabetic neuropathy, unspecified: Secondary | ICD-10-CM | POA: Diagnosis not present

## 2016-09-08 DIAGNOSIS — Z880 Allergy status to penicillin: Secondary | ICD-10-CM | POA: Diagnosis not present

## 2016-09-08 DIAGNOSIS — I129 Hypertensive chronic kidney disease with stage 1 through stage 4 chronic kidney disease, or unspecified chronic kidney disease: Secondary | ICD-10-CM | POA: Diagnosis not present

## 2016-09-08 DIAGNOSIS — E7800 Pure hypercholesterolemia, unspecified: Secondary | ICD-10-CM | POA: Insufficient documentation

## 2016-09-08 DIAGNOSIS — M869 Osteomyelitis, unspecified: Secondary | ICD-10-CM

## 2016-09-08 DIAGNOSIS — Z794 Long term (current) use of insulin: Secondary | ICD-10-CM | POA: Insufficient documentation

## 2016-09-08 DIAGNOSIS — E1169 Type 2 diabetes mellitus with other specified complication: Secondary | ICD-10-CM | POA: Diagnosis present

## 2016-09-08 DIAGNOSIS — Z89431 Acquired absence of right foot: Secondary | ICD-10-CM | POA: Diagnosis not present

## 2016-09-08 DIAGNOSIS — K7460 Unspecified cirrhosis of liver: Secondary | ICD-10-CM | POA: Insufficient documentation

## 2016-09-08 DIAGNOSIS — F4310 Post-traumatic stress disorder, unspecified: Secondary | ICD-10-CM | POA: Diagnosis not present

## 2016-09-08 DIAGNOSIS — E1122 Type 2 diabetes mellitus with diabetic chronic kidney disease: Secondary | ICD-10-CM | POA: Insufficient documentation

## 2016-09-08 DIAGNOSIS — N189 Chronic kidney disease, unspecified: Secondary | ICD-10-CM | POA: Diagnosis not present

## 2016-09-08 DIAGNOSIS — M797 Fibromyalgia: Secondary | ICD-10-CM | POA: Insufficient documentation

## 2016-09-08 HISTORY — DX: Constipation, unspecified: K59.00

## 2016-09-08 HISTORY — DX: Other intervertebral disc degeneration, lumbar region: M51.36

## 2016-09-08 HISTORY — DX: Other intervertebral disc displacement, lumbar region: M51.26

## 2016-09-08 HISTORY — PX: I & D EXTREMITY: SHX5045

## 2016-09-08 HISTORY — DX: Depression, unspecified: F32.A

## 2016-09-08 HISTORY — DX: Other intervertebral disc degeneration, lumbar region without mention of lumbar back pain or lower extremity pain: M51.369

## 2016-09-08 HISTORY — DX: Major depressive disorder, single episode, unspecified: F32.9

## 2016-09-08 HISTORY — DX: Polyneuropathy, unspecified: G62.9

## 2016-09-08 HISTORY — PX: I&D EXTREMITY: SHX5045

## 2016-09-08 LAB — PROTIME-INR
INR: 1.03
INR: 1.13
PROTHROMBIN TIME: 14.5 s (ref 11.4–15.2)
Prothrombin Time: 13.5 seconds (ref 11.4–15.2)

## 2016-09-08 LAB — COMPREHENSIVE METABOLIC PANEL
ALT: 21 U/L (ref 14–54)
ANION GAP: 9 (ref 5–15)
AST: 25 U/L (ref 15–41)
Albumin: 3.5 g/dL (ref 3.5–5.0)
Alkaline Phosphatase: 77 U/L (ref 38–126)
BUN: 28 mg/dL — ABNORMAL HIGH (ref 6–20)
CHLORIDE: 108 mmol/L (ref 101–111)
CO2: 21 mmol/L — AB (ref 22–32)
Calcium: 9.1 mg/dL (ref 8.9–10.3)
Creatinine, Ser: 1.61 mg/dL — ABNORMAL HIGH (ref 0.44–1.00)
GFR calc non Af Amer: 34 mL/min — ABNORMAL LOW (ref >60)
GFR, EST AFRICAN AMERICAN: 39 mL/min — AB (ref >60)
Glucose, Bld: 138 mg/dL — ABNORMAL HIGH (ref 65–99)
Potassium: 4.1 mmol/L (ref 3.5–5.1)
SODIUM: 138 mmol/L (ref 135–145)
Total Bilirubin: 0.6 mg/dL (ref 0.3–1.2)
Total Protein: 7 g/dL (ref 6.5–8.1)

## 2016-09-08 LAB — GLUCOSE, CAPILLARY
GLUCOSE-CAPILLARY: 134 mg/dL — AB (ref 65–99)
Glucose-Capillary: 92 mg/dL (ref 65–99)

## 2016-09-08 LAB — CBC
HCT: 37 % (ref 36.0–46.0)
Hemoglobin: 11.8 g/dL — ABNORMAL LOW (ref 12.0–15.0)
MCH: 26.5 pg (ref 26.0–34.0)
MCHC: 31.9 g/dL (ref 30.0–36.0)
MCV: 83.1 fL (ref 78.0–100.0)
PLATELETS: 161 10*3/uL (ref 150–400)
RBC: 4.45 MIL/uL (ref 3.87–5.11)
RDW: 15 % (ref 11.5–15.5)
WBC: 7.9 10*3/uL (ref 4.0–10.5)

## 2016-09-08 LAB — APTT
APTT: 20 s — AB (ref 24–36)
aPTT: 27 seconds (ref 24–36)

## 2016-09-08 SURGERY — IRRIGATION AND DEBRIDEMENT EXTREMITY
Anesthesia: Monitor Anesthesia Care | Site: Foot | Laterality: Left

## 2016-09-08 MED ORDER — MIDAZOLAM HCL 2 MG/2ML IJ SOLN
INTRAMUSCULAR | Status: AC
  Administered 2016-09-08: 1 mg via INTRAVENOUS
  Filled 2016-09-08: qty 2

## 2016-09-08 MED ORDER — FENTANYL CITRATE (PF) 100 MCG/2ML IJ SOLN
INTRAMUSCULAR | Status: AC
  Administered 2016-09-08: 50 ug via INTRAVENOUS
  Filled 2016-09-08: qty 2

## 2016-09-08 MED ORDER — 0.9 % SODIUM CHLORIDE (POUR BTL) OPTIME
TOPICAL | Status: DC | PRN
  Administered 2016-09-08: 1000 mL

## 2016-09-08 MED ORDER — FENTANYL CITRATE (PF) 100 MCG/2ML IJ SOLN
INTRAMUSCULAR | Status: DC | PRN
  Administered 2016-09-08: 100 ug via INTRAVENOUS

## 2016-09-08 MED ORDER — PROMETHAZINE HCL 25 MG/ML IJ SOLN
6.2500 mg | INTRAMUSCULAR | Status: DC | PRN
  Administered 2016-09-08: 6.25 mg via INTRAVENOUS

## 2016-09-08 MED ORDER — MIDAZOLAM HCL 5 MG/5ML IJ SOLN
INTRAMUSCULAR | Status: DC | PRN
  Administered 2016-09-08: 2 mg via INTRAVENOUS

## 2016-09-08 MED ORDER — FENTANYL CITRATE (PF) 100 MCG/2ML IJ SOLN
50.0000 ug | Freq: Once | INTRAMUSCULAR | Status: AC
  Administered 2016-09-08: 50 ug via INTRAVENOUS

## 2016-09-08 MED ORDER — MIDAZOLAM HCL 2 MG/2ML IJ SOLN
INTRAMUSCULAR | Status: AC
  Filled 2016-09-08: qty 2

## 2016-09-08 MED ORDER — HYDROCODONE-ACETAMINOPHEN 5-325 MG PO TABS: 1.0000 | ORAL_TABLET | ORAL | 0 refills | Status: DC | PRN

## 2016-09-08 MED ORDER — CLINDAMYCIN PHOSPHATE 900 MG/50ML IV SOLN
INTRAVENOUS | Status: AC
  Filled 2016-09-08: qty 50

## 2016-09-08 MED ORDER — CHLORHEXIDINE GLUCONATE 4 % EX LIQD: 60.0000 mL | Freq: Once | CUTANEOUS | Status: DC

## 2016-09-08 MED ORDER — FENTANYL CITRATE (PF) 100 MCG/2ML IJ SOLN
INTRAMUSCULAR | Status: AC
  Filled 2016-09-08: qty 2

## 2016-09-08 MED ORDER — PROMETHAZINE HCL 25 MG/ML IJ SOLN
INTRAMUSCULAR | Status: AC
  Filled 2016-09-08: qty 1

## 2016-09-08 MED ORDER — HYDROMORPHONE HCL 1 MG/ML IJ SOLN: 0.2500 mg | INTRAMUSCULAR | Status: DC | PRN

## 2016-09-08 MED ORDER — ONDANSETRON HCL 4 MG/2ML IJ SOLN
INTRAMUSCULAR | Status: DC | PRN
  Administered 2016-09-08: 4 mg via INTRAVENOUS

## 2016-09-08 MED ORDER — MIDAZOLAM HCL 2 MG/2ML IJ SOLN
1.0000 mg | Freq: Once | INTRAMUSCULAR | Status: AC
  Administered 2016-09-08: 1 mg via INTRAVENOUS

## 2016-09-08 MED ORDER — CLINDAMYCIN PHOSPHATE 900 MG/50ML IV SOLN
900.0000 mg | INTRAVENOUS | Status: AC
  Administered 2016-09-08: 900 mg via INTRAVENOUS

## 2016-09-08 MED ORDER — PROPOFOL 500 MG/50ML IV EMUL
INTRAVENOUS | Status: DC | PRN
Start: 2016-09-08 — End: 2016-09-08
  Administered 2016-09-08: 100 ug/kg/min via INTRAVENOUS

## 2016-09-08 MED ORDER — ONDANSETRON HCL 4 MG/2ML IJ SOLN
INTRAMUSCULAR | Status: AC
  Filled 2016-09-08: qty 2

## 2016-09-08 MED ORDER — BUPIVACAINE HCL (PF) 0.5 % IJ SOLN
INTRAMUSCULAR | Status: DC | PRN
  Administered 2016-09-08: 30 mL via PERINEURAL

## 2016-09-08 MED ORDER — LACTATED RINGERS IV SOLN
INTRAVENOUS | Status: DC | PRN
Start: 2016-09-08 — End: 2016-09-08
  Administered 2016-09-08: 15:00:00 via INTRAVENOUS

## 2016-09-08 MED ORDER — PROPOFOL 10 MG/ML IV BOLUS
INTRAVENOUS | Status: AC
  Filled 2016-09-08: qty 20

## 2016-09-08 MED ORDER — LACTATED RINGERS IV SOLN
INTRAVENOUS | Status: DC
  Administered 2016-09-08: 13:00:00 via INTRAVENOUS

## 2016-09-08 SURGICAL SUPPLY — 42 items
BLADE SURG 10 STRL SS (BLADE) IMPLANT
BLADE SURG 21 STRL SS (BLADE) ×3 IMPLANT
BNDG COHESIVE 4X5 TAN STRL (GAUZE/BANDAGES/DRESSINGS) ×3 IMPLANT
BNDG COHESIVE 6X5 TAN STRL LF (GAUZE/BANDAGES/DRESSINGS) IMPLANT
BNDG GAUZE ELAST 4 BULKY (GAUZE/BANDAGES/DRESSINGS) ×3 IMPLANT
COVER SURGICAL LIGHT HANDLE (MISCELLANEOUS) ×6 IMPLANT
DRAPE INCISE IOBAN 66X45 STRL (DRAPES) ×12 IMPLANT
DRAPE U-SHAPE 47X51 STRL (DRAPES) ×3 IMPLANT
DRSG ADAPTIC 3X8 NADH LF (GAUZE/BANDAGES/DRESSINGS) ×3 IMPLANT
DRSG PAD ABDOMINAL 8X10 ST (GAUZE/BANDAGES/DRESSINGS) ×3 IMPLANT
DURAPREP 26ML APPLICATOR (WOUND CARE) ×3 IMPLANT
ELECT CAUTERY BLADE 6.4 (BLADE) IMPLANT
ELECT REM PT RETURN 9FT ADLT (ELECTROSURGICAL)
ELECTRODE REM PT RTRN 9FT ADLT (ELECTROSURGICAL) IMPLANT
GAUZE SPONGE 4X4 12PLY STRL (GAUZE/BANDAGES/DRESSINGS) ×3 IMPLANT
GLOVE BIOGEL PI IND STRL 9 (GLOVE) ×1 IMPLANT
GLOVE BIOGEL PI INDICATOR 9 (GLOVE) ×2
GLOVE SURG ORTHO 9.0 STRL STRW (GLOVE) ×3 IMPLANT
GOWN STRL REUS W/ TWL LRG LVL3 (GOWN DISPOSABLE) ×1 IMPLANT
GOWN STRL REUS W/ TWL XL LVL3 (GOWN DISPOSABLE) ×2 IMPLANT
GOWN STRL REUS W/TWL LRG LVL3 (GOWN DISPOSABLE) ×2
GOWN STRL REUS W/TWL XL LVL3 (GOWN DISPOSABLE) ×4
HANDPIECE INTERPULSE COAX TIP (DISPOSABLE)
KIT BASIN OR (CUSTOM PROCEDURE TRAY) ×3 IMPLANT
KIT PREVENA INCISION MGT20CM45 (CANNISTER) ×3 IMPLANT
KIT ROOM TURNOVER OR (KITS) ×3 IMPLANT
MANIFOLD NEPTUNE II (INSTRUMENTS) ×3 IMPLANT
NS IRRIG 1000ML POUR BTL (IV SOLUTION) ×3 IMPLANT
PACK ORTHO EXTREMITY (CUSTOM PROCEDURE TRAY) ×3 IMPLANT
PAD ARMBOARD 7.5X6 YLW CONV (MISCELLANEOUS) ×6 IMPLANT
SET HNDPC FAN SPRY TIP SCT (DISPOSABLE) IMPLANT
SPONGE GAUZE 4X4 12PLY STER LF (GAUZE/BANDAGES/DRESSINGS) ×3 IMPLANT
STOCKINETTE IMPERVIOUS 9X36 MD (GAUZE/BANDAGES/DRESSINGS) IMPLANT
SWAB COLLECTION DEVICE MRSA (MISCELLANEOUS) ×3 IMPLANT
TOWEL OR 17X24 6PK STRL BLUE (TOWEL DISPOSABLE) ×3 IMPLANT
TOWEL OR 17X26 10 PK STRL BLUE (TOWEL DISPOSABLE) ×3 IMPLANT
TUBE ANAEROBIC SPECIMEN COL (MISCELLANEOUS) IMPLANT
TUBE CONNECTING 12'X1/4 (SUCTIONS) ×1
TUBE CONNECTING 12X1/4 (SUCTIONS) ×2 IMPLANT
UNDERPAD 30X30 (UNDERPADS AND DIAPERS) ×3 IMPLANT
WATER STERILE IRR 1000ML POUR (IV SOLUTION) ×3 IMPLANT
YANKAUER SUCT BULB TIP NO VENT (SUCTIONS) ×3 IMPLANT

## 2016-09-08 NOTE — Anesthesia Preprocedure Evaluation (Addendum)
Anesthesia Evaluation  Patient identified by MRN, date of birth, ID band Patient awake    Reviewed: Allergy & Precautions, NPO status , Patient's Chart, lab work & pertinent test results  Airway Mallampati: II  TM Distance: >3 FB Neck ROM: Full    Dental  (+) Dental Advisory Given   Pulmonary neg pulmonary ROS,    breath sounds clear to auscultation       Cardiovascular hypertension, Pt. on medications  Rhythm:Regular Rate:Normal     Neuro/Psych Anxiety Depression    GI/Hepatic Neg liver ROS, GERD  ,  Endo/Other  diabetes, Type 2, Insulin Dependent  Renal/GU CRFRenal disease     Musculoskeletal  (+) Arthritis ,   Abdominal   Peds  Hematology  (+) anemia ,   Anesthesia Other Findings   Reproductive/Obstetrics                            Lab Results  Component Value Date   WBC 7.9 09/08/2016   HGB 11.8 (L) 09/08/2016   HCT 37.0 09/08/2016   MCV 83.1 09/08/2016   PLT 161 09/08/2016   Lab Results  Component Value Date   CREATININE 1.61 (H) 09/08/2016   BUN 28 (H) 09/08/2016   NA 138 09/08/2016   K 4.1 09/08/2016   CL 108 09/08/2016   CO2 21 (L) 09/08/2016    Anesthesia Physical Anesthesia Plan  ASA: III  Anesthesia Plan: MAC and Regional   Post-op Pain Management:    Induction: Intravenous  Airway Management Planned: Natural Airway and Simple Face Mask  Additional Equipment:   Intra-op Plan:   Post-operative Plan:   Informed Consent: I have reviewed the patients History and Physical, chart, labs and discussed the procedure including the risks, benefits and alternatives for the proposed anesthesia with the patient or authorized representative who has indicated his/her understanding and acceptance.     Plan Discussed with: CRNA  Anesthesia Plan Comments:        Anesthesia Quick Evaluation
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
## 2016-09-08 NOTE — Anesthesia Postprocedure Evaluation (Signed)
Anesthesia Post Note  Patient: [name] [name]  Procedure(s) Performed: Procedure(s) (LRB): Left Partial Calcaneus Excision (Left)  Patient location during evaluation: PACU Anesthesia Type: MAC and Regional Level of consciousness: awake and alert Pain management: pain level controlled Vital Signs Assessment: post-procedure vital signs reviewed and stable Respiratory status: spontaneous breathing, nonlabored ventilation, respiratory function stable and patient connected to nasal cannula oxygen Cardiovascular status: stable and blood pressure returned to baseline Anesthetic complications: no    Last Vitals:  Vitals:   09/08/16 1830 09/08/16 1845  BP: 112/60 (!) 114/58  Pulse: 70 68  Resp: 11 11  Temp:  36.4 C    Last Pain:  Vitals:   09/08/16 1845  TempSrc:   PainSc: 0-No pain                 Gerardine Peltz S

## 2016-09-08 NOTE — Op Note (Signed)
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 Application of incisional wound VAC.  SURGEON:  [name], MD  PHYSICIAN ASSISTANT:None ANESTHESIA:   General  PREOPERATIVE INDICATIONS:  [name] [name] is a  60 y.o. female with a diagnosis of Osteomyelitis Left Heel who failed conservative measures and elected for surgical management.    The risks benefits and alternatives were discussed with the patient preoperatively including but not limited to the risks of infection, bleeding, nerve injury, cardiopulmonary complications, the need for revision surgery, among others, and the patient was willing to proceed.  OPERATIVE IMPLANTS: Prevena wound VAC  OPERATIVE FINDINGS: Ulcerative infection which extended down to the calcaneus  OPERATIVE PROCEDURE: Patient brought to the operating room after undergoing a regional block. After adequate levels anesthesia obtained patient's left lower extremity was prepped using ChloraPrep and draped into a sterile field a timeout was called. A longitudinal elliptical incision was made around the ulcerative tissue was extended proximally and distally. The calcaneus was then resected the distal 2 cm with a oscillating saw the partial calcaneal excision was removed using sharp dissection. The wound was irrigated with normal saline electrocautery was used for hemostasis. Local tissue rearrangement was used for wound closure for a wound 8 x 4 cm. The wound closed without tension on the skin. A Prevena wound VAC was applied this had a good suction fit patient was taken the PACU in stable condition plan for discharge to home follow-up the office in 1 week

## 2016-09-08 NOTE — Transfer of Care (Signed)
Immediate Anesthesia Transfer of Care Note  Patient: [name] [name]  Procedure(s) Performed: Procedure(s): Left Partial Calcaneus Excision (Left)  Patient Location: PACU  Anesthesia Type:MAC and MAC combined with regional for post-op pain  Level of Consciousness: awake, oriented, sedated, patient cooperative and responds to stimulation  Airway & Oxygen Therapy: Patient Spontanous Breathing and Patient connected to nasal cannula oxygen  Post-op Assessment: Report given to RN, Post -op Vital signs reviewed and stable, Patient moving all extremities and Patient moving all extremities X 4  Post vital signs: Reviewed and stable  Last Vitals:  Vitals:   09/08/16 1525 09/08/16 1530  BP: 125/64 134/62  Pulse: 70 70  Resp: 18 19  Temp:      Last Pain:  Vitals:   09/08/16 1207  TempSrc: Oral         Complications: No apparent anesthesia complications

## 2016-09-08 NOTE — Anesthesia Procedure Notes (Signed)
Anesthesia Regional Block:  Popliteal block  Pre-Anesthetic Checklist: ,, timeout performed, Correct Patient, Correct Site, Correct Laterality, Correct Procedure, Correct Position, site marked, Risks and benefits discussed,  Surgical consent,  Pre-op evaluation,  At surgeon's request and post-op pain management  Laterality: Left  Prep: chloraprep       Needles:  Injection technique: Single-shot  Needle Type: Echogenic Stimulator Needle     Needle Length: 9cm 9 cm Needle Gauge: 21 and 21 G    Additional Needles:  Procedures: ultrasound guided (picture in chart) and nerve stimulator Popliteal block  Nerve Stimulator or Paresthesia:  Response: tibial and peroneal, 0.5 mA,   Additional Responses:   Narrative:  Start time: 09/08/2016 3:08 PM End time: 09/08/2016 3:43 PM Injection made incrementally with aspirations every 5 mL.  Performed by: Personally  Anesthesiologist: Suzette Battiest

## 2016-09-08 NOTE — Progress Notes (Signed)
Orthopedic Tech Progress Note Patient Details:  [name] [name] 12-20-1955 [id number]  Ortho Devices Type of Ortho Device: Postop shoe/boot Ortho Device/Splint Interventions: Application   [name] 09/08/2016, 7:09 PM

## 2016-09-08 NOTE — Anesthesia Procedure Notes (Signed)
Procedure Name: MAC Date/Time: 09/08/2016 4:16 PM Performed by: Jacquiline Doe A Pre-anesthesia Checklist: Patient identified, Emergency Drugs available, Suction available and Patient being monitored Patient Re-evaluated:Patient Re-evaluated prior to inductionOxygen Delivery Method: Nasal cannula Intubation Type: IV induction Placement Confirmation: positive ETCO2 Dental Injury: Teeth and Oropharynx as per pre-operative assessment

## 2016-09-09 ENCOUNTER — Encounter (HOSPITAL_COMMUNITY): Payer: Self-pay | Admitting: Orthopedic Surgery

## 2016-09-14 ENCOUNTER — Ambulatory Visit (INDEPENDENT_AMBULATORY_CARE_PROVIDER_SITE_OTHER): Payer: BLUE CROSS/BLUE SHIELD | Admitting: Physical Medicine and Rehabilitation

## 2016-09-14 ENCOUNTER — Encounter (INDEPENDENT_AMBULATORY_CARE_PROVIDER_SITE_OTHER): Payer: Self-pay | Admitting: Physical Medicine and Rehabilitation

## 2016-09-14 VITALS — BP 109/58 | HR 89

## 2016-09-14 DIAGNOSIS — M5416 Radiculopathy, lumbar region: Secondary | ICD-10-CM

## 2016-09-14 DIAGNOSIS — M5116 Intervertebral disc disorders with radiculopathy, lumbar region: Secondary | ICD-10-CM | POA: Diagnosis not present

## 2016-09-14 MED ORDER — METHYLPREDNISOLONE ACETATE 80 MG/ML IJ SUSP
80.0000 mg | Freq: Once | INTRAMUSCULAR | Status: AC
  Administered 2016-09-14: 80 mg

## 2016-09-14 MED ORDER — LIDOCAINE HCL (PF) 1 % IJ SOLN
0.3300 mL | Freq: Once | INTRAMUSCULAR | Status: AC
  Administered 2016-09-14: 0.3 mL
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
## 2016-09-14 NOTE — Patient Instructions (Signed)

## 2016-09-14 NOTE — Procedures (Signed)
Lumbar Epidural Steroid Injection - Interlaminar Approach with Fluoroscopic Guidance  Patient: [name] [name]      Date of Birth: [birth year] MRN: [id number] PCP: Ann Held, DO      Visit Date: 09/14/2016   Universal Protocol:    Date/Time: 11/16/174:46 PM  Consent Given By: the patient  Position: PRONE  Additional Comments: Vital signs were monitored before and after the procedure. Patient was prepped and draped in the usual sterile fashion. The correct patient, procedure, and site was verified.   Injection Procedure Details:  Procedure Site One Meds Administered:  Meds ordered this encounter  Medications  . lidocaine (PF) (XYLOCAINE) 1 % injection 0.3 mL  . methylPREDNISolone acetate (DEPO-MEDROL) injection 80 mg     Laterality: Right  Location/Site:  L4-L5  Needle size: 20 G  Needle type: Tuohy  Needle Placement: Paramedian epidural  Findings:  -Contrast Used: 1 mL iohexol 180 mg iodine/mL   -Comments: Excellent flow of contrast into the epidural space.  Procedure Details: Using a paramedian approach from the side mentioned above, the region overlying the inferior lamina was localized under fluoroscopic visualization and the soft tissues overlying this structure were infiltrated with 4 ml. of 1% Lidocaine without Epinephrine. The Tuohy needle was inserted into the epidural space using a paramedian approach.   The epidural space was localized using loss of resistance along with lateral and bi-planar fluoroscopic views.  After negative aspirate for air, blood, and CSF, a 2 ml. volume of Isovue-250 was injected into the epidural space and the flow of contrast was observed. Radiographs were obtained for documentation purposes.    The injectate was administered into the level noted above.   Additional Comments:  The patient tolerated the procedure well Dressing: Band-Aid    Post-procedure details: Patient was observed during the  procedure. Post-procedure instructions were reviewed.  Patient left the clinic in stable condition.

## 2016-09-15 ENCOUNTER — Ambulatory Visit (INDEPENDENT_AMBULATORY_CARE_PROVIDER_SITE_OTHER): Payer: BLUE CROSS/BLUE SHIELD | Admitting: Orthopedic Surgery

## 2016-09-15 ENCOUNTER — Encounter (INDEPENDENT_AMBULATORY_CARE_PROVIDER_SITE_OTHER): Payer: Self-pay | Admitting: Orthopedic Surgery

## 2016-09-15 VITALS — Ht 62.0 in | Wt 195.0 lb

## 2016-09-15 DIAGNOSIS — M86172 Other acute osteomyelitis, left ankle and foot: Secondary | ICD-10-CM | POA: Diagnosis not present

## 2016-09-15 MED ORDER — SILVER SULFADIAZINE 1 % EX CREA: 1.0000 "application " | TOPICAL_CREAM | Freq: Every day | CUTANEOUS | 0 refills | Status: DC
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
## 2016-09-16 ENCOUNTER — Other Ambulatory Visit: Payer: Self-pay | Admitting: Family Medicine

## 2016-09-18 ENCOUNTER — Ambulatory Visit (INDEPENDENT_AMBULATORY_CARE_PROVIDER_SITE_OTHER): Payer: BLUE CROSS/BLUE SHIELD | Admitting: Orthopedic Surgery

## 2016-09-18 NOTE — Telephone Encounter (Signed)
eScribe request from [facility] for refill on Lyrica 183m Last filled - 03/13/16, #90x5 Last AEX - 07/07/16 Next AEX - 09/26/16 Please Advise on refills/SLS 11/20

## 2016-09-18 NOTE — Telephone Encounter (Signed)
Rx request faxed to pharmacy/SLS  

## 2016-09-25 ENCOUNTER — Telehealth: Payer: Self-pay | Admitting: Family Medicine

## 2016-09-25 NOTE — Telephone Encounter (Signed)
Relation to BV:QXIH Call back [phone]  Reason for call:  Patient requesting a refill oxyCODONE-acetaminophen (PERCOCET/ROXICET) 5-325 MG tablet

## 2016-09-26 ENCOUNTER — Other Ambulatory Visit: Payer: Self-pay

## 2016-09-26 ENCOUNTER — Ambulatory Visit (INDEPENDENT_AMBULATORY_CARE_PROVIDER_SITE_OTHER): Payer: BLUE CROSS/BLUE SHIELD | Admitting: Orthopedic Surgery

## 2016-09-26 MED ORDER — HYDROCODONE-ACETAMINOPHEN 5-325 MG PO TABS: 1.0000 | ORAL_TABLET | ORAL | 0 refills | Status: DC | PRN

## 2016-09-26 NOTE — Telephone Encounter (Signed)
oxyCODONE-acetaminophen (PERCOCET/ROXICET) 5-325 MG   Last ov 07/07/16. Last fill 09/08/16 #60 0. Please advise.

## 2016-09-26 NOTE — Telephone Encounter (Signed)
Printed Rx and awaiting providers signature. LB

## 2016-09-26 NOTE — Telephone Encounter (Signed)
Called pt to inform her that Rx is in front ready for pick up. Pt states her husband will pick up Rx tomorrow because she just had surgery. LB

## 2016-09-26 NOTE — Telephone Encounter (Signed)
Refill x1 

## 2016-09-28 ENCOUNTER — Ambulatory Visit (INDEPENDENT_AMBULATORY_CARE_PROVIDER_SITE_OTHER): Payer: BLUE CROSS/BLUE SHIELD | Admitting: Family

## 2016-09-28 ENCOUNTER — Encounter (HOSPITAL_COMMUNITY): Payer: Self-pay | Admitting: *Deleted

## 2016-09-28 ENCOUNTER — Other Ambulatory Visit (INDEPENDENT_AMBULATORY_CARE_PROVIDER_SITE_OTHER): Payer: Self-pay | Admitting: Family

## 2016-09-28 ENCOUNTER — Encounter (INDEPENDENT_AMBULATORY_CARE_PROVIDER_SITE_OTHER): Payer: Self-pay | Admitting: Family

## 2016-09-28 VITALS — Ht 62.0 in | Wt 195.0 lb

## 2016-09-28 DIAGNOSIS — M869 Osteomyelitis, unspecified: Secondary | ICD-10-CM

## 2016-09-28 DIAGNOSIS — L97424 Non-pressure chronic ulcer of left heel and midfoot with necrosis of bone: Secondary | ICD-10-CM

## 2016-09-28 DIAGNOSIS — E11621 Type 2 diabetes mellitus with foot ulcer: Secondary | ICD-10-CM

## 2016-09-28 NOTE — Progress Notes (Signed)
Pt denies SOB, chest pain, and being under the care of a cardiologist. Pt denies having a cardiac cath and stress test. Pt made aware to stop taking Aspirin, vitamins, fish oil and herbal medications such as Cinnamon,Flaxseed oil, probiotic. Do not take any NSAIDs ie: Ibuprofen, Advil, Naproxen, BC and Goody Powder or any medication containing Aspirin. Pt stated that her fasting blood glucose ranges from 117-120. Pt made aware of Diabetes Protocol to take half dos of Lantus tonight ( 20 units instead of 40), do not take 15 units of Apidra the morning of surgery. Pt made aware to check blood glucose (BG) every 2 hours prior to arrival, interventions for a BG <70>220 and Phone # to SS. Pt verbalized understanding of all pre-op instructions.
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
## 2016-09-29 ENCOUNTER — Ambulatory Visit (HOSPITAL_COMMUNITY)
Admission: RE | Admit: 2016-09-29 | Discharge: 2016-10-02 | Disposition: A | Discharge status: Home Health | Payer: BLUE CROSS/BLUE SHIELD | Source: Ambulatory Visit | Attending: Orthopedic Surgery | Admitting: Orthopedic Surgery

## 2016-09-29 ENCOUNTER — Ambulatory Visit (HOSPITAL_COMMUNITY): Payer: BLUE CROSS/BLUE SHIELD | Admitting: Certified Registered Nurse Anesthetist

## 2016-09-29 ENCOUNTER — Encounter (HOSPITAL_COMMUNITY)
Admission: RE | Disposition: A | Discharge status: Home Health | Payer: Self-pay | Source: Ambulatory Visit | Attending: Orthopedic Surgery

## 2016-09-29 ENCOUNTER — Encounter (HOSPITAL_COMMUNITY): Payer: Self-pay | Admitting: *Deleted

## 2016-09-29 DIAGNOSIS — Y838 Other surgical procedures as the cause of abnormal reaction of the patient, or of later complication, without mention of misadventure at the time of the procedure: Secondary | ICD-10-CM | POA: Insufficient documentation

## 2016-09-29 DIAGNOSIS — K7460 Unspecified cirrhosis of liver: Secondary | ICD-10-CM | POA: Diagnosis not present

## 2016-09-29 DIAGNOSIS — T8132XA Disruption of internal operation (surgical) wound, not elsewhere classified, initial encounter: Secondary | ICD-10-CM | POA: Insufficient documentation

## 2016-09-29 DIAGNOSIS — E1122 Type 2 diabetes mellitus with diabetic chronic kidney disease: Secondary | ICD-10-CM | POA: Insufficient documentation

## 2016-09-29 DIAGNOSIS — Z89431 Acquired absence of right foot: Secondary | ICD-10-CM | POA: Insufficient documentation

## 2016-09-29 DIAGNOSIS — Z880 Allergy status to penicillin: Secondary | ICD-10-CM | POA: Insufficient documentation

## 2016-09-29 DIAGNOSIS — Z794 Long term (current) use of insulin: Secondary | ICD-10-CM | POA: Diagnosis not present

## 2016-09-29 DIAGNOSIS — I129 Hypertensive chronic kidney disease with stage 1 through stage 4 chronic kidney disease, or unspecified chronic kidney disease: Secondary | ICD-10-CM | POA: Diagnosis not present

## 2016-09-29 DIAGNOSIS — M1990 Unspecified osteoarthritis, unspecified site: Secondary | ICD-10-CM | POA: Insufficient documentation

## 2016-09-29 DIAGNOSIS — K219 Gastro-esophageal reflux disease without esophagitis: Secondary | ICD-10-CM | POA: Insufficient documentation

## 2016-09-29 DIAGNOSIS — I252 Old myocardial infarction: Secondary | ICD-10-CM | POA: Insufficient documentation

## 2016-09-29 DIAGNOSIS — E7800 Pure hypercholesterolemia, unspecified: Secondary | ICD-10-CM | POA: Diagnosis not present

## 2016-09-29 DIAGNOSIS — M797 Fibromyalgia: Secondary | ICD-10-CM | POA: Diagnosis not present

## 2016-09-29 DIAGNOSIS — T8131XA Disruption of external operation (surgical) wound, not elsewhere classified, initial encounter: Secondary | ICD-10-CM | POA: Diagnosis present

## 2016-09-29 DIAGNOSIS — F329 Major depressive disorder, single episode, unspecified: Secondary | ICD-10-CM | POA: Diagnosis not present

## 2016-09-29 DIAGNOSIS — E1140 Type 2 diabetes mellitus with diabetic neuropathy, unspecified: Secondary | ICD-10-CM | POA: Diagnosis not present

## 2016-09-29 DIAGNOSIS — W19XXXA Unspecified fall, initial encounter: Secondary | ICD-10-CM | POA: Insufficient documentation

## 2016-09-29 DIAGNOSIS — M86172 Other acute osteomyelitis, left ankle and foot: Secondary | ICD-10-CM

## 2016-09-29 DIAGNOSIS — M869 Osteomyelitis, unspecified: Secondary | ICD-10-CM

## 2016-09-29 HISTORY — PX: I&D EXTREMITY: SHX5045

## 2016-09-29 HISTORY — PX: I & D EXTREMITY: SHX5045

## 2016-09-29 HISTORY — DX: Disruption of external operation (surgical) wound, not elsewhere classified, initial encounter: T81.31XA

## 2016-09-29 LAB — CBC
HCT: 33.1 % — ABNORMAL LOW (ref 36.0–46.0)
Hemoglobin: 10.3 g/dL — ABNORMAL LOW (ref 12.0–15.0)
MCH: 25.7 pg — ABNORMAL LOW (ref 26.0–34.0)
MCHC: 31.1 g/dL (ref 30.0–36.0)
MCV: 82.5 fL (ref 78.0–100.0)
PLATELETS: 174 10*3/uL (ref 150–400)
RBC: 4.01 MIL/uL (ref 3.87–5.11)
RDW: 14.6 % (ref 11.5–15.5)
WBC: 6.4 10*3/uL (ref 4.0–10.5)

## 2016-09-29 LAB — GLUCOSE, CAPILLARY
GLUCOSE-CAPILLARY: 113 mg/dL — AB (ref 65–99)
GLUCOSE-CAPILLARY: 98 mg/dL (ref 65–99)
GLUCOSE-CAPILLARY: 101 mg/dL — AB (ref 65–99)
Glucose-Capillary: 188 mg/dL — ABNORMAL HIGH (ref 65–99)

## 2016-09-29 LAB — BASIC METABOLIC PANEL
ANION GAP: 7 (ref 5–15)
BUN: 25 mg/dL — ABNORMAL HIGH (ref 6–20)
CO2: 25 mmol/L (ref 22–32)
Calcium: 9.1 mg/dL (ref 8.9–10.3)
Chloride: 106 mmol/L (ref 101–111)
Creatinine, Ser: 1.53 mg/dL — ABNORMAL HIGH (ref 0.44–1.00)
GFR calc Af Amer: 42 mL/min — ABNORMAL LOW (ref >60)
GFR, EST NON AFRICAN AMERICAN: 36 mL/min — AB (ref >60)
GLUCOSE: 124 mg/dL — AB (ref 65–99)
POTASSIUM: 4.1 mmol/L (ref 3.5–5.1)
SODIUM: 138 mmol/L (ref 135–145)

## 2016-09-29 SURGERY — IRRIGATION AND DEBRIDEMENT EXTREMITY
Anesthesia: General | Site: Foot | Laterality: Left

## 2016-09-29 MED ORDER — LISINOPRIL 5 MG PO TABS
5.0000 mg | ORAL_TABLET | Freq: Every day | ORAL | Status: DC
  Administered 2016-09-29 – 2016-10-02 (×2): 5 mg via ORAL
  Filled 2016-09-29 (×4): qty 1

## 2016-09-29 MED ORDER — LACTATED RINGERS IV SOLN
INTRAVENOUS | Status: DC
  Administered 2016-09-29: 13:00:00 via INTRAVENOUS

## 2016-09-29 MED ORDER — ONDANSETRON HCL 4 MG/2ML IJ SOLN: 4.0000 mg | Freq: Four times a day (QID) | INTRAMUSCULAR | Status: DC | PRN

## 2016-09-29 MED ORDER — DEXTROSE 5 % IV SOLN
500.0000 mg | Freq: Four times a day (QID) | INTRAVENOUS | Status: DC | PRN
  Filled 2016-09-29: qty 5

## 2016-09-29 MED ORDER — INSULIN GLARGINE 100 UNIT/ML ~~LOC~~ SOLN
20.0000 [IU] | Freq: Every day | SUBCUTANEOUS | Status: DC
  Administered 2016-09-29 – 2016-10-01 (×3): 20 [IU] via SUBCUTANEOUS
  Filled 2016-09-29 (×4): qty 0.2

## 2016-09-29 MED ORDER — LACTATED RINGERS IV SOLN: INTRAVENOUS | Status: DC

## 2016-09-29 MED ORDER — CLINDAMYCIN PHOSPHATE 600 MG/50ML IV SOLN
600.0000 mg | Freq: Four times a day (QID) | INTRAVENOUS | Status: AC
  Administered 2016-09-29 – 2016-09-30 (×3): 600 mg via INTRAVENOUS
  Filled 2016-09-29 (×3): qty 50

## 2016-09-29 MED ORDER — ACETAMINOPHEN 650 MG RE SUPP: 650.0000 mg | Freq: Four times a day (QID) | RECTAL | Status: DC | PRN

## 2016-09-29 MED ORDER — INSULIN ASPART 100 UNIT/ML ~~LOC~~ SOLN
4.0000 [IU] | Freq: Three times a day (TID) | SUBCUTANEOUS | Status: DC
  Administered 2016-09-30 – 2016-10-02 (×8): 4 [IU] via SUBCUTANEOUS

## 2016-09-29 MED ORDER — ASPIRIN EC 325 MG PO TBEC
325.0000 mg | DELAYED_RELEASE_TABLET | Freq: Every day | ORAL | Status: DC
  Administered 2016-09-30 – 2016-10-02 (×4): 325 mg via ORAL
  Filled 2016-09-29 (×4): qty 1

## 2016-09-29 MED ORDER — CETIRIZINE HCL 10 MG PO TABS
10.0000 mg | ORAL_TABLET | Freq: Every evening | ORAL | Status: DC
  Administered 2016-09-30 – 2016-10-01 (×3): 10 mg via ORAL
  Filled 2016-09-29 (×5): qty 1

## 2016-09-29 MED ORDER — HYDROMORPHONE HCL 2 MG/ML IJ SOLN: 1.0000 mg | INTRAMUSCULAR | Status: DC | PRN

## 2016-09-29 MED ORDER — ONDANSETRON HCL 4 MG PO TABS: 4.0000 mg | ORAL_TABLET | Freq: Four times a day (QID) | ORAL | Status: DC | PRN

## 2016-09-29 MED ORDER — INSULIN ASPART 100 UNIT/ML ~~LOC~~ SOLN
0.0000 [IU] | Freq: Three times a day (TID) | SUBCUTANEOUS | Status: DC
  Administered 2016-09-30: 2 [IU] via SUBCUTANEOUS
  Administered 2016-09-30: 5 [IU] via SUBCUTANEOUS
  Administered 2016-09-30: 3 [IU] via SUBCUTANEOUS
  Administered 2016-10-01: 5 [IU] via SUBCUTANEOUS
  Administered 2016-10-01: 3 [IU] via SUBCUTANEOUS
  Administered 2016-10-01: 0 [IU] via SUBCUTANEOUS
  Administered 2016-10-02: 2 [IU] via SUBCUTANEOUS
  Administered 2016-10-02: 3 [IU] via SUBCUTANEOUS

## 2016-09-29 MED ORDER — CLINDAMYCIN PHOSPHATE 900 MG/50ML IV SOLN
900.0000 mg | INTRAVENOUS | Status: AC
  Administered 2016-09-29: 900 mg via INTRAVENOUS
  Filled 2016-09-29: qty 50

## 2016-09-29 MED ORDER — SODIUM CHLORIDE 0.9 % IV SOLN: INTRAVENOUS | Status: DC

## 2016-09-29 MED ORDER — PHENYLEPHRINE HCL 10 MG/ML IJ SOLN
INTRAMUSCULAR | Status: DC | PRN
  Administered 2016-09-29: 80 ug via INTRAVENOUS

## 2016-09-29 MED ORDER — FENTANYL CITRATE (PF) 100 MCG/2ML IJ SOLN
INTRAMUSCULAR | Status: AC
  Filled 2016-09-29: qty 2

## 2016-09-29 MED ORDER — GENTAMICIN SULFATE 40 MG/ML IJ SOLN
INTRAMUSCULAR | Status: AC
  Filled 2016-09-29: qty 4

## 2016-09-29 MED ORDER — ONDANSETRON HCL 4 MG/2ML IJ SOLN
INTRAMUSCULAR | Status: DC | PRN
  Administered 2016-09-29: 4 mg via INTRAVENOUS

## 2016-09-29 MED ORDER — METOCLOPRAMIDE HCL 5 MG/ML IJ SOLN: 5.0000 mg | Freq: Three times a day (TID) | INTRAMUSCULAR | Status: DC | PRN

## 2016-09-29 MED ORDER — PROPOFOL 10 MG/ML IV BOLUS
INTRAVENOUS | Status: DC | PRN
  Administered 2016-09-29: 200 mg via INTRAVENOUS

## 2016-09-29 MED ORDER — VANCOMYCIN HCL 1000 MG IV SOLR
INTRAVENOUS | Status: AC
  Filled 2016-09-29: qty 1000

## 2016-09-29 MED ORDER — MEPERIDINE HCL 25 MG/ML IJ SOLN: 6.2500 mg | INTRAMUSCULAR | Status: DC | PRN

## 2016-09-29 MED ORDER — HYDROCHLOROTHIAZIDE 25 MG PO TABS
25.0000 mg | ORAL_TABLET | Freq: Every day | ORAL | Status: DC
  Administered 2016-10-02: 25 mg via ORAL
  Filled 2016-09-29 (×3): qty 1

## 2016-09-29 MED ORDER — LIDOCAINE HCL (CARDIAC) 20 MG/ML IV SOLN
INTRAVENOUS | Status: DC | PRN
  Administered 2016-09-29: 60 mg via INTRAVENOUS

## 2016-09-29 MED ORDER — OXYCODONE HCL 5 MG PO TABS: 5.0000 mg | ORAL_TABLET | ORAL | 0 refills | Status: DC | PRN

## 2016-09-29 MED ORDER — HYDROMORPHONE HCL 1 MG/ML IJ SOLN: 0.2500 mg | INTRAMUSCULAR | Status: DC | PRN

## 2016-09-29 MED ORDER — PROMETHAZINE HCL 25 MG/ML IJ SOLN
6.2500 mg | INTRAMUSCULAR | Status: DC | PRN
  Administered 2016-09-29: 6.25 mg via INTRAVENOUS

## 2016-09-29 MED ORDER — DULOXETINE HCL 60 MG PO CPEP
60.0000 mg | ORAL_CAPSULE | Freq: Every day | ORAL | Status: DC
  Administered 2016-09-30 – 2016-10-02 (×3): 60 mg via ORAL
  Filled 2016-09-29 (×4): qty 1

## 2016-09-29 MED ORDER — GENTAMICIN SULFATE 40 MG/ML IJ SOLN
INTRAMUSCULAR | Status: DC | PRN
  Administered 2016-09-29: 160 mg

## 2016-09-29 MED ORDER — ACETAMINOPHEN 325 MG PO TABS: 650.0000 mg | ORAL_TABLET | Freq: Four times a day (QID) | ORAL | Status: DC | PRN

## 2016-09-29 MED ORDER — 0.9 % SODIUM CHLORIDE (POUR BTL) OPTIME
TOPICAL | Status: DC | PRN
  Administered 2016-09-29: 1000 mL

## 2016-09-29 MED ORDER — MIDAZOLAM HCL 2 MG/2ML IJ SOLN
INTRAMUSCULAR | Status: AC
  Filled 2016-09-29: qty 2

## 2016-09-29 MED ORDER — PROMETHAZINE HCL 25 MG/ML IJ SOLN
INTRAMUSCULAR | Status: AC
  Filled 2016-09-29: qty 1

## 2016-09-29 MED ORDER — VANCOMYCIN HCL 1000 MG IV SOLR
INTRAVENOUS | Status: DC | PRN
  Administered 2016-09-29: 1000 mg

## 2016-09-29 MED ORDER — METHOCARBAMOL 500 MG PO TABS
500.0000 mg | ORAL_TABLET | Freq: Four times a day (QID) | ORAL | Status: DC | PRN
  Administered 2016-09-29 – 2016-10-01 (×6): 500 mg via ORAL
  Filled 2016-09-29 (×6): qty 1

## 2016-09-29 MED ORDER — CHLORHEXIDINE GLUCONATE 4 % EX LIQD: 60.0000 mL | Freq: Once | CUTANEOUS | Status: DC

## 2016-09-29 MED ORDER — OXYCODONE HCL 5 MG PO TABS
5.0000 mg | ORAL_TABLET | ORAL | Status: DC | PRN
Start: 2016-09-29 — End: 2016-10-02
  Administered 2016-09-29 – 2016-09-30 (×7): 10 mg via ORAL
  Administered 2016-10-01 (×4): 5 mg via ORAL
  Filled 2016-09-29: qty 1
  Filled 2016-09-29 (×4): qty 2
  Filled 2016-09-29: qty 1
  Filled 2016-09-29 (×4): qty 2
  Filled 2016-09-29: qty 1

## 2016-09-29 MED ORDER — METOCLOPRAMIDE HCL 5 MG PO TABS
5.0000 mg | ORAL_TABLET | Freq: Three times a day (TID) | ORAL | Status: DC | PRN
Start: 2016-09-29 — End: 2016-10-02

## 2016-09-29 SURGICAL SUPPLY — 41 items
BLADE SURG 10 STRL SS (BLADE) IMPLANT
BLADE SURG 21 STRL SS (BLADE) ×3 IMPLANT
BNDG COHESIVE 4X5 TAN STRL (GAUZE/BANDAGES/DRESSINGS) IMPLANT
BNDG COHESIVE 6X5 TAN STRL LF (GAUZE/BANDAGES/DRESSINGS) IMPLANT
BNDG GAUZE ELAST 4 BULKY (GAUZE/BANDAGES/DRESSINGS) ×6 IMPLANT
CANISTER WOUND CARE 500ML ATS (WOUND CARE) ×3 IMPLANT
COVER SURGICAL LIGHT HANDLE (MISCELLANEOUS) ×6 IMPLANT
DRAPE U-SHAPE 47X51 STRL (DRAPES) ×3 IMPLANT
DRSG ADAPTIC 3X8 NADH LF (GAUZE/BANDAGES/DRESSINGS) ×3 IMPLANT
DURAPREP 26ML APPLICATOR (WOUND CARE) ×3 IMPLANT
ELECT CAUTERY BLADE 6.4 (BLADE) IMPLANT
ELECT REM PT RETURN 9FT ADLT (ELECTROSURGICAL)
ELECTRODE REM PT RTRN 9FT ADLT (ELECTROSURGICAL) IMPLANT
GAUZE SPONGE 4X4 12PLY STRL (GAUZE/BANDAGES/DRESSINGS) ×3 IMPLANT
GLOVE BIOGEL PI IND STRL 9 (GLOVE) ×1 IMPLANT
GLOVE BIOGEL PI INDICATOR 9 (GLOVE) ×2
GLOVE SURG ORTHO 9.0 STRL STRW (GLOVE) ×3 IMPLANT
GOWN STRL REUS W/ TWL LRG LVL3 (GOWN DISPOSABLE) ×1 IMPLANT
GOWN STRL REUS W/ TWL XL LVL3 (GOWN DISPOSABLE) ×2 IMPLANT
GOWN STRL REUS W/TWL LRG LVL3 (GOWN DISPOSABLE) ×2
GOWN STRL REUS W/TWL XL LVL3 (GOWN DISPOSABLE) ×4
HANDPIECE INTERPULSE COAX TIP (DISPOSABLE)
KIT BASIN OR (CUSTOM PROCEDURE TRAY) ×3 IMPLANT
KIT ROOM TURNOVER OR (KITS) ×3 IMPLANT
KIT STIMULAN RAPID CURE 5CC (Orthopedic Implant) ×3 IMPLANT
MANIFOLD NEPTUNE II (INSTRUMENTS) ×3 IMPLANT
NS IRRIG 1000ML POUR BTL (IV SOLUTION) ×3 IMPLANT
PACK ORTHO EXTREMITY (CUSTOM PROCEDURE TRAY) ×3 IMPLANT
PAD ARMBOARD 7.5X6 YLW CONV (MISCELLANEOUS) ×6 IMPLANT
SET HNDPC FAN SPRY TIP SCT (DISPOSABLE) IMPLANT
STOCKINETTE IMPERVIOUS 9X36 MD (GAUZE/BANDAGES/DRESSINGS) IMPLANT
SUT ETHILON 2 0 PSLX (SUTURE) ×3 IMPLANT
SWAB COLLECTION DEVICE MRSA (MISCELLANEOUS) ×3 IMPLANT
TOWEL OR 17X24 6PK STRL BLUE (TOWEL DISPOSABLE) ×3 IMPLANT
TOWEL OR 17X26 10 PK STRL BLUE (TOWEL DISPOSABLE) ×3 IMPLANT
TUBE ANAEROBIC SPECIMEN COL (MISCELLANEOUS) IMPLANT
TUBE CONNECTING 12'X1/4 (SUCTIONS) ×1
TUBE CONNECTING 12X1/4 (SUCTIONS) ×2 IMPLANT
UNDERPAD 30X30 (UNDERPADS AND DIAPERS) ×3 IMPLANT
WATER STERILE IRR 1000ML POUR (IV SOLUTION) ×3 IMPLANT
YANKAUER SUCT BULB TIP NO VENT (SUCTIONS) ×3 IMPLANT

## 2016-09-29 NOTE — Anesthesia Preprocedure Evaluation (Addendum)
Anesthesia Evaluation  Patient identified by MRN, date of birth, ID band Patient awake    Reviewed: Allergy & Precautions, NPO status , Patient's Chart, lab work & pertinent test results  Airway Mallampati: II  TM Distance: >3 FB Neck ROM: Full    Dental  (+) Dental Advisory Given, Edentulous Upper, Partial Lower   Pulmonary neg pulmonary ROS,    breath sounds clear to auscultation       Cardiovascular hypertension, Pt. on medications + CAD and + Past MI   Rhythm:Regular Rate:Normal     Neuro/Psych PSYCHIATRIC DISORDERS Anxiety Depression  Neuromuscular disease    GI/Hepatic Neg liver ROS, GERD  Medicated and Controlled,  Endo/Other  diabetes, Well Controlled, Type 2, Insulin DependentPt occasionally has dips in blood glucose. Took half her usual dose of Lantus at bedtime yesterday.  Renal/GU CRFRenal disease  negative genitourinary   Musculoskeletal  (+) Arthritis , Osteoarthritis,  Fibromyalgia -  Abdominal   Peds negative pediatric ROS (+)  Hematology  (+) anemia ,   Anesthesia Other Findings   Reproductive/Obstetrics negative OB ROS                           Lab Results  Component Value Date   WBC 6.4 09/29/2016   HGB 10.3 (L) 09/29/2016   HCT 33.1 (L) 09/29/2016   MCV 82.5 09/29/2016   PLT 174 09/29/2016   Lab Results  Component Value Date   CREATININE 1.53 (H) 09/29/2016   BUN 25 (H) 09/29/2016   NA 138 09/29/2016   K 4.1 09/29/2016   CL 106 09/29/2016   CO2 25 09/29/2016   EKG: normal sinus rhythm.  Echo - Left ventricle: The cavity size was normal. Systolic function was   normal. The estimated ejection fraction was in the range of 55%   to 60%. Wall motion was normal; there were no regional wall   motion abnormalities. Left ventricular diastolic function   parameters were normal. - Left atrium: The atrium was mildly dilated.  Anesthesia Physical  Anesthesia  Plan  ASA: III  Anesthesia Plan: General   Post-op Pain Management:    Induction: Intravenous  Airway Management Planned: LMA  Additional Equipment:   Intra-op Plan:   Post-operative Plan: Extubation in OR  Informed Consent: I have reviewed the patients History and Physical, chart, labs and discussed the procedure including the risks, benefits and alternatives for the proposed anesthesia with the patient or authorized representative who has indicated his/her understanding and acceptance.   Dental advisory given  Plan Discussed with: CRNA  Anesthesia Plan Comments:        Anesthesia Quick Evaluation
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
## 2016-09-29 NOTE — Op Note (Signed)
09/29/2016  5:17 PM  PATIENT:  [name] [name]    PRE-OPERATIVE DIAGNOSIS:  Dehiscence Left Partial Calcaneus Excision  POST-OPERATIVE DIAGNOSIS:  Same  PROCEDURE:  IRRIGATION AND DEBRIDEMENT LEFT FOOT PARTIAL CALCANEUS EXCISION, PLACEMENT OF ANTIBIOTIC BEADS, APPLICATION OF WOUND VAC, local tissue rearrangement for wound closure 4 x 10 cm.  SURGEON:  [name], MD  PHYSICIAN ASSISTANT:None ANESTHESIA:   General  PREOPERATIVE INDICATIONS:  [name] [name] is a  60 y.o. female with a diagnosis of Dehiscence Left Partial Calcaneus Excision who failed conservative measures and elected for surgical management.    The risks benefits and alternatives were discussed with the patient preoperatively including but not limited to the risks of infection, bleeding, nerve injury, cardiopulmonary complications, the need for revision surgery, among others, and the patient was willing to proceed.  OPERATIVE IMPLANTS: Placement of antibiotic beads 5 mL stimulant with 160 mg gentamicin and 500 mg vancomycin  OPERATIVE FINDINGS: Animal hair within the wound no deep abscess  OPERATIVE PROCEDURE: Patient was brought the operating room and underwent general anesthetic. After adequate levels anesthesia obtained patient's lower extremity was prepped using DuraPrep draped into a sterile field a timeout was called. Elliptical incision was made around the dehisced wound margins approximately 1 cm around the wound which left a wound 4 x 10 cm. This was carried down to bone in the distal 2 cm of calcaneus was excised. The wound was irrigated with normal saline there is no deep abscess. Electrocautery was used for hemostasis. Antibiotic beads were placed deep in the wound after irrigation with normal saline. The wound underwent local tissue rearrangement for wound closure 4 x 10 cm. A wound VAC was applied. Patient was extubated taken to the PACU in stable condition.

## 2016-09-29 NOTE — Transfer of Care (Signed)
Immediate Anesthesia Transfer of Care Note  Patient: [name] [name]  Procedure(s) Performed: Procedure(s): IRRIGATION AND DEBRIDEMENT LEFT FOOT PARTIAL CALCANEUS EXCISION, PLACEMENT OF ANTIBIOTIC BEADS, APPLICATION OF WOUND VAC (Left)  Patient Location: PACU  Anesthesia Type:General  Level of Consciousness: awake, sedated and patient cooperative  Airway & Oxygen Therapy: Patient Spontanous Breathing and Patient connected to nasal cannula oxygen  Post-op Assessment: Report given to RN, Post -op Vital signs reviewed and stable and Patient moving all extremities  Post vital signs: Reviewed and stable  Last Vitals:  Vitals:   09/29/16 1219 09/29/16 1555  BP: 124/61   Pulse: 81   Resp: 18   Temp: 37.2 C (P) 36.5 C    Last Pain:  Vitals:   09/29/16 1304  TempSrc:   PainSc: 8       Patients Stated Pain Goal: 3 (61/96/94 0982)  Complications: No apparent anesthesia complications

## 2016-09-29 NOTE — Progress Notes (Addendum)
Orthopedic Tech Progress Note Patient Details:  [name] [name] [birth year] [id number]  Ortho Devices Type of Ortho Device: CAM walker Ortho Device/Splint Location: LLE Ortho Device/Splint Interventions: Ordered  Cam walker in room [name] 09/29/2016, 7:55 PM

## 2016-09-29 NOTE — Anesthesia Procedure Notes (Signed)
Procedure Name: LMA Insertion Date/Time: 09/29/2016 3:24 PM Performed by: Williemae Area B Pre-anesthesia Checklist: Patient identified, Emergency Drugs available, Suction available and Patient being monitored Patient Re-evaluated:Patient Re-evaluated prior to inductionOxygen Delivery Method: Circle system utilized Preoxygenation: Pre-oxygenation with 100% oxygen Intubation Type: IV induction Ventilation: Mask ventilation without difficulty LMA: LMA inserted LMA Size: 4.0 Number of attempts: 1 Placement Confirmation: positive ETCO2 and breath sounds checked- equal and bilateral Tube secured with: Tape (taped across cheeks) Dental Injury: Teeth and Oropharynx as per pre-operative assessment

## 2016-09-30 DIAGNOSIS — T8132XA Disruption of internal operation (surgical) wound, not elsewhere classified, initial encounter: Secondary | ICD-10-CM | POA: Diagnosis not present

## 2016-09-30 LAB — GLUCOSE, CAPILLARY
GLUCOSE-CAPILLARY: 184 mg/dL — AB (ref 65–99)
GLUCOSE-CAPILLARY: 140 mg/dL — AB (ref 65–99)
Glucose-Capillary: 201 mg/dL — ABNORMAL HIGH (ref 65–99)
Glucose-Capillary: 121 mg/dL — ABNORMAL HIGH (ref 65–99)

## 2016-09-30 NOTE — Evaluation (Signed)
Physical Therapy Evaluation Patient Details Name: [name] [name] MRN: [id number] DOB: May 28, 1956 Today's Date: 09/30/2016   History of Present Illness  60 yo female admitted on 09/29/16 for debridement and wound vac to left heel following osteomyelitis. 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   Clinical Impression  Pt is POD 1 following above procedure and is limited by pain this session. Prior to admission, pt was able to perform own self care and maneuvered around home with a knee scooter including using knee scooter to get up and down the stairs. Pt would like to have another knee scooter, but might benefit from platform RW to assist with mobility with NWB status to get up and down stairs and for mobility throughout home. Will attempt at next therapy session. Pt will benefit from being seen acutely to address below deficits in order to assist with smooth transition home.     Follow Up Recommendations Home health PT;Supervision/Assistance - 24 hour;Supervision for mobility/OOB    Equipment Recommendations  Rolling walker with 5" wheels;3in1 (PT);Other (comment) (Knee Scooter (Pt Priority over RW), Platform for RW)    Recommendations for Other Services       Precautions / Restrictions Precautions Precautions: Fall Restrictions Weight Bearing Restrictions: Yes LLE Weight Bearing: Non weight bearing      Mobility  Bed Mobility Overal bed mobility: Needs Assistance Bed Mobility: Supine to Sit;Sit to Supine     Supine to sit: Min guard Sit to supine: Min guard   General bed mobility comments: Min guard for safety   Transfers                    Ambulation/Gait                Stairs            Wheelchair Mobility    Modified Rankin (Stroke Patients Only)       Balance Overall balance assessment: Needs assistance Sitting-balance support: No upper extremity supported Sitting balance-Leahy Scale: Good Sitting balance -  Comments: EOB with no back support                                     Pertinent Vitals/Pain Pain Assessment: 0-10 Pain Score: 9  Pain Location: left heel Pain Descriptors / Indicators: Sharp;Stabbing Pain Intervention(s): Monitored during session;Premedicated before session    [place] expects to be discharged to:: Private residence Living Arrangements: Spouse/significant other Available Help at Discharge: Available PRN/intermittently;Family Type of Home: House Home Access: Stairs to enter Entrance Stairs-Rails: None Entrance Stairs-Number of Steps: 3 Home Layout: Two level;1/2 bath on main level Home Equipment: Youth worker - 2 wheels;Shower seat;Other (comment) (knee scooter)      Prior Function Level of Independence: Independent with assistive device(s)         Comments: has been dealing with left heel wound for past 8 months. wasn't working, but was driving and was using knee walker     Hand Dominance   Dominant Hand: Left    Extremity/Trunk Assessment   Upper Extremity Assessment: LUE deficits/detail       LUE Deficits / Details: decreased mobility in right elbow due to multiple surgeries. Unable to WB through RW. Will attempt platform RW   Lower Extremity Assessment: LLE deficits/detail   LLE Deficits / Details: decreased ankle mobility but overalll 3/5 per gross functional assessment  Communication   Communication: No difficulties  Cognition Arousal/Alertness: Awake/alert Behavior During Therapy: WFL for tasks assessed/performed Overall Cognitive Status: Within Functional Limits for tasks assessed                      General Comments      Exercises     Assessment/Plan    PT Assessment Patient needs continued PT services  PT Problem List Decreased strength;Decreased activity tolerance;Decreased balance;Decreased mobility;Decreased knowledge of use of DME;Pain          PT Treatment  Interventions DME instruction;Gait training;Functional mobility training;Stair training;Therapeutic activities;Therapeutic exercise;Balance training;Patient/family education    PT Goals (Current goals can be found in the Care Plan section)  Acute Rehab PT Goals Patient Stated Goal: to get home PT Goal Formulation: With patient Time For Goal Achievement: 10/07/16 Potential to Achieve Goals: Good    Frequency Min 3X/week   Barriers to discharge        Co-evaluation               End of Session Equipment Utilized During Treatment: Gait belt Activity Tolerance: Patient tolerated treatment well Patient left: in bed;with call bell/phone within reach;with SCD's reapplied Nurse Communication: Mobility status    Functional Assessment Tool Used: therapist experiece, mobility assessment Functional Limitation: Mobility: Walking and moving around Mobility: Walking and Moving Around Current Status (I4332): At least 20 percent but less than 40 percent impaired, limited or restricted Mobility: Walking and Moving Around Goal Status [phone]): At least 1 percent but less than 20 percent impaired, limited or restricted    Time: 1500-1530 PT Time Calculation (min) (ACUTE ONLY): 30 min   Charges:   PT Evaluation $PT Eval Low Complexity: 1 Procedure PT Treatments $Therapeutic Activity: 8-22 mins   PT G Codes:   PT G-Codes **NOT FOR INPATIENT CLASS** Functional Assessment Tool Used: therapist experiece, mobility assessment Functional Limitation: Mobility: Walking and moving around Mobility: Walking and Moving Around Current Status (C1660): At least 20 percent but less than 40 percent impaired, limited or restricted Mobility: Walking and Moving Around Goal Status [phone]): At least 1 percent but less than 20 percent impaired, limited or restricted    Scheryl Marten PT, DPT  [phone]  09/30/2016, 4:29 PM

## 2016-09-30 NOTE — Anesthesia Postprocedure Evaluation (Signed)
Anesthesia Post Note  Patient: [name] [name]  Procedure(s) Performed: Procedure(s) (LRB): IRRIGATION AND DEBRIDEMENT LEFT FOOT PARTIAL CALCANEUS EXCISION, PLACEMENT OF ANTIBIOTIC BEADS, APPLICATION OF WOUND VAC (Left)  Patient location during evaluation: PACU Anesthesia Type: General Level of consciousness: awake and alert Pain management: pain level controlled Vital Signs Assessment: post-procedure vital signs reviewed and stable Respiratory status: spontaneous breathing, nonlabored ventilation, respiratory function stable and patient connected to nasal cannula oxygen Cardiovascular status: blood pressure returned to baseline and stable Postop Assessment: no signs of nausea or vomiting Anesthetic complications: no    Last Vitals:  Vitals:   09/29/16 2300 09/30/16 0540  BP: 109/65 111/60  Pulse: 80 84  Resp: 15 15  Temp: 36.7 C 37.1 C    Last Pain:  Vitals:   09/30/16 0540  TempSrc: Oral  PainSc:                  Effie Berkshire

## 2016-09-30 NOTE — Progress Notes (Signed)
Subjective: 1 Day Post-Op Procedure(s) (LRB): IRRIGATION AND DEBRIDEMENT LEFT FOOT PARTIAL CALCANEUS EXCISION, PLACEMENT OF ANTIBIOTIC BEADS, APPLICATION OF WOUND VAC (Left) Patient reports pain as mild.    Objective: Vital signs in last 24 hours: Temp:  [97.7 F (36.5 C)-99 F (37.2 C)] 98.8 F (37.1 C) (12/02 0540) Pulse Rate:  [78-87] 84 (12/02 0540) Resp:  [10-19] 15 (12/02 0540) BP: (95-124)/(51-69) 111/60 (12/02 0540) SpO2:  [92 %-100 %] 100 % (12/02 0540) Weight:  [195 lb (88.5 kg)] 195 lb (88.5 kg) (12/01 1219)  Intake/Output from previous day: 12/01 0701 - 12/02 0700 In: 1087.5 [P.O.:120; I.V.:867.5; IV Piggyback:100] Out: 925 [Urine:650; Drains:250; Blood:25] Intake/Output this shift: No intake/output data recorded.   Recent Labs  09/29/16 1239  HGB 10.3*    Recent Labs  09/29/16 1239  WBC 6.4  RBC 4.01  HCT 33.1*  PLT 174    Recent Labs  09/29/16 1239  NA 138  K 4.1  CL 106  CO2 25  BUN 25*  CREATININE 1.53*  GLUCOSE 124*  CALCIUM 9.1   No results for input(s): LABPT, INR in the last 72 hours.  Sensation intact distally Intact pulses distally Dorsiflexion/Plantar flexion intact Incision: dressing C/D/I Compartment soft Wound vac in place   Assessment/Plan: 1 Day Post-Op Procedure(s) (LRB): IRRIGATION AND DEBRIDEMENT LEFT FOOT PARTIAL CALCANEUS EXCISION, PLACEMENT OF ANTIBIOTIC BEADS, APPLICATION OF WOUND VAC (Left) Continue current care  Non weight bearing left lower extremity.   GILBERT CLARK 09/30/2016, 7:56 AM

## 2016-10-01 ENCOUNTER — Encounter (HOSPITAL_COMMUNITY): Payer: Self-pay | Admitting: *Deleted

## 2016-10-01 DIAGNOSIS — T8132XA Disruption of internal operation (surgical) wound, not elsewhere classified, initial encounter: Secondary | ICD-10-CM | POA: Diagnosis not present

## 2016-10-01 LAB — GLUCOSE, CAPILLARY
GLUCOSE-CAPILLARY: 120 mg/dL — AB (ref 65–99)
GLUCOSE-CAPILLARY: 158 mg/dL — AB (ref 65–99)
GLUCOSE-CAPILLARY: 237 mg/dL — AB (ref 65–99)
GLUCOSE-CAPILLARY: 146 mg/dL — AB (ref 65–99)

## 2016-10-01 NOTE — Care Management Note (Addendum)
Case Management Note  Patient Details  Name: [name] [name] MRN: [id number] Date of Birth: January 16, 1956  Subjective/Objective:                  IRRIGATION AND DEBRIDEMENT LEFT FOOT PARTIAL CALCANEUS EXCISION, APPLICATION OF WOUND VAC (Left)  Action/Plan: CM spoke with patient at the bedside. Patient states she lives at home with her husband but would be alone most of the time. Reports her husband works 76 - 15 hours a day, leaves the home at 4am. Reports she fell while at home after her previous surgery and is concerned about falling again. Reports she will not be able to put pressure on her foot for at least 6 weeks. She will need to care for 2 dogs in the home at discharge. She plans to sleep in a recliner and use the half bath downstairs if discharged home. She has a  knee scooter she obtained 4-5 years ago which she states is not stable. States she will have to figure out how to get herself to the bathroom and how to prepare meals and care for the dogs while at home alone if she is discharged home. She has a 35 year old daughter, reports she has told her she does not have time to help her when she returns home. CSW consult entered to discuss patient's interest in possible d/c to SNF for 1 week. CM will continue to monitor for needs.   1125 - Per CSW patient will not qualify for SNF post discharge. Patient's nurse notified and discussed the need for DME and [place] orders from the physician in order to arrange discharge needs. CM spoke with patient at the bedside. Provided a list of [facility] providers. Patient selects [facility] services. Jermaine at [facility] notified of the request. Reggie at [facility] notified of DME orders. Patient expects to be discharged home tomorrow. Patient plans to rent at knee scooter from [facility].   Expected Discharge Date:   10/02/16            Expected Discharge Plan:  [place]  In-House Referral:  Clinical Social Work  Discharge planning Services  CM Consult  Post  Acute Care Choice:   Home Health Choice offered to:   Patient  DME Arranged:   3N1, RW DME Agency:   [place]:   PT [place] Agency:   [place]  Status of Service:  Completed, signed off  If discussed at [place] of Stay Meetings, dates discussed:    Additional Comments:  Apolonio Schneiders, RN 10/01/2016, 9:10 AM

## 2016-10-01 NOTE — Progress Notes (Signed)
Physical Therapy Treatment Patient Details Name: [name] [name] MRN: [id number] DOB: 1956-02-14 Today's Date: 10/01/2016    History of Present Illness 60 yo female admitted on 09/29/16 for debridement and wound vac to left heel following osteomyelitis. 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     PT Comments    Pt was seen to attempt platform RW use in order to improve mobility. Pt has decided she is pleased with her knee scooter and would rather use this device than attempt a platform RW at this time. Performed stand pivot transfer from bed to chair with min guard and pt was left comfortable in chair. Advised pt to discuss with HHPT if she decides she needs platform Rw.    Follow Up Recommendations  Home health PT;Supervision/Assistance - 24 hour;Supervision for mobility/OOB     Equipment Recommendations  Other (comment) (Knee scooter, pt needs a new one and is familiar with it)    Recommendations for Other Services       Precautions / Restrictions Precautions Precautions: Fall Restrictions Weight Bearing Restrictions: Yes LLE Weight Bearing: Non weight bearing    Mobility  Bed Mobility Overal bed mobility: Needs Assistance Bed Mobility: Supine to Sit     Supine to sit: Modified independent (Device/Increase time)     General bed mobility comments: Use of railings, but overall without assistance  Transfers Overall transfer level: Needs assistance Equipment used: None Transfers: Stand Pivot Transfers   Stand pivot transfers: Min guard       General transfer comment: Min guard for safety  Ambulation/Gait                 Stairs            Wheelchair Mobility    Modified Rankin (Stroke Patients Only)       Balance                                    Cognition Arousal/Alertness: Awake/alert Behavior During Therapy: WFL for tasks assessed/performed Overall Cognitive Status: Within Functional Limits for  tasks assessed                      Exercises General Exercises - Lower Extremity Ankle Circles/Pumps: AROM;Both;10 reps;Supine Quad Sets: AROM;Both;10 reps;Supine Heel Slides: AROM;Right;10 reps;Supine Straight Leg Raises: AROM;Both;10 reps;Supine    General Comments        Pertinent Vitals/Pain Pain Assessment: 0-10 Pain Score: 5  Pain Location: L heel Pain Descriptors / Indicators: Burning;Sharp;Stabbing Pain Intervention(s): Monitored during session;Premedicated before session;Repositioned    Home Living                      Prior Function            PT Goals (current goals can now be found in the care plan section) Acute Rehab PT Goals Patient Stated Goal: to get home Progress towards PT goals: Progressing toward goals    Frequency    Min 3X/week      PT Plan Current plan remains appropriate    Co-evaluation             End of Session Equipment Utilized During Treatment: Gait belt Activity Tolerance: Patient tolerated treatment well Patient left: in chair;with call bell/phone within reach     Time: 1022-1034 PT Time Calculation (min) (ACUTE ONLY): 12 min  Charges:  $Therapeutic Activity: 8-22  mins                    G Codes:      Scheryl Marten PT, DPT  [phone]  10/01/2016, 12:32 PM

## 2016-10-01 NOTE — Progress Notes (Signed)
[name], female   DOB: Jan 25, 1956, 60 y.o.   MRN: [id number] Doing well overall.  Vitals stable.  Vac on bottom of foot with good seal.  Will continue VAC today.  Dr. Sharol Given to evaluate tomorrow.

## 2016-10-01 NOTE — Clinical Social Work Note (Addendum)
Clinical Social Worker received consult from [facility] regarding patient concerns for return home with limited support.  CSW met with patient and explained that therapy recommendations remain home health at this time and patient insurance will likely deem patient custodial care for placement.  Patient verbalized understanding and is agreeable with return home with home health.  Patient states that she will reach out to friends and family for additional support.  Clinical Social Worker will sign off for now as social work intervention is no longer needed. Please consult [name] again if new need arises.  [name] [name], [place]

## 2016-10-02 ENCOUNTER — Encounter (HOSPITAL_COMMUNITY): Payer: Self-pay

## 2016-10-02 DIAGNOSIS — T8132XA Disruption of internal operation (surgical) wound, not elsewhere classified, initial encounter: Secondary | ICD-10-CM | POA: Diagnosis not present

## 2016-10-02 LAB — GLUCOSE, CAPILLARY
Glucose-Capillary: 131 mg/dL — ABNORMAL HIGH (ref 65–99)
Glucose-Capillary: 157 mg/dL — ABNORMAL HIGH (ref 65–99)

## 2016-10-02 NOTE — Discharge Summary (Signed)
Discharge Diagnoses:  Active Problems:   Acute osteomyelitis of calcaneum, left (HCC)   Surgeries: Procedure(s): IRRIGATION AND DEBRIDEMENT LEFT FOOT PARTIAL CALCANEUS EXCISION, PLACEMENT OF ANTIBIOTIC BEADS, APPLICATION OF WOUND VAC on 09/29/2016    Consultants:  none  Discharged Condition: Improved  Hospital Course: [name] [name] is an 60 y.o. female who was admitted 09/29/2016 with a chief complaint of osteomyelitis and wound dehiscence status post partial calcaneal excision on the left., with a final diagnosis of Dehiscence Left Partial Calcaneus Excision.  Patient was brought to the operating room on 09/29/2016 and underwent Procedure(s): IRRIGATION AND DEBRIDEMENT LEFT FOOT PARTIAL CALCANEUS EXCISION, PLACEMENT OF ANTIBIOTIC BEADS, APPLICATION OF WOUND VAC.    Patient was given perioperative antibiotics: Anti-infectives    Start     Dose/Rate Route Frequency Ordered Stop   09/30/16 0600  clindamycin (CLEOCIN) IVPB 900 mg     900 mg 100 mL/hr over 30 Minutes Intravenous On call to O.R. 09/29/16 1237 09/29/16 1542   09/29/16 2200  clindamycin (CLEOCIN) IVPB 600 mg     600 mg 100 mL/hr over 30 Minutes Intravenous Every 6 hours 09/29/16 1831 09/30/16 1024   09/29/16 1510  vancomycin (VANCOCIN) powder  Status:  Discontinued       As needed 09/29/16 1510 09/29/16 1553   09/29/16 1509  gentamicin (GARAMYCIN) injection  Status:  Discontinued       As needed 09/29/16 1510 09/29/16 1553    .  Patient was given sequential compression devices, early ambulation, and aspirin for DVT prophylaxis.  Recent vital signs: Patient Vitals for the past 24 hrs:  BP Temp Temp src Pulse Resp SpO2  10/02/16 0436 (!) 107/50 98 F (36.7 C) Oral 79 16 100 %  10/01/16 2025 116/63 98.1 F (36.7 C) Oral 82 16 100 %  10/01/16 1338 124/60 99.8 F (37.7 C) - 93 16 96 %  .  Recent laboratory studies: No results found.  Discharge Medications:     Medication List    TAKE these medications    atorvastatin 10 MG tablet Commonly known as:  LIPITOR Take 1 tablet (10 mg total) by mouth daily.   CALTRATE 600+D 600-400 MG-UNIT tablet Generic drug:  Calcium Carbonate-Vitamin D Take 1 tablet by mouth daily.   CINNAMON PO Take 1,000 mg by mouth daily.   Dulaglutide 1.5 MG/0.5ML Sopn Commonly known as:  TRULICITY Inject 1.5 mg weekly What changed:  how much to take  how to take this  when to take this  additional instructions   DULoxetine 60 MG capsule Commonly known as:  CYMBALTA TAKE ONE CAPSULE BY MOUTH ONCE DAILY   ENSURE PLUS Liqd Take 237 mLs by mouth daily.   fenofibrate 160 MG tablet Take 1 tablet (160 mg total) by mouth daily.   Flax Seed Oil 1000 MG Caps Take 1 capsule by mouth at bedtime.   hydrochlorothiazide 25 MG tablet Commonly known as:  HYDRODIURIL Take 1 tablet (25 mg total) by mouth daily.   HYDROcodone-acetaminophen 5-325 MG tablet Commonly known as:  NORCO/VICODIN Take 1 tablet by mouth every 4 (four) hours as needed for moderate pain.   Insulin Glulisine 100 UNIT/ML Solostar Pen Commonly known as:  APIDRA SOLOSTAR INJECT 15 UNITS SUBCUTANEOUSLY in am and 10 units before dinner   LANTUS SOLOSTAR 100 UNIT/ML Solostar Pen Generic drug:  Insulin Glargine INJECT 40 UNITS SUBCUTANEOUSLY ONCE DAILY AT BEDTIME   levocetirizine 5 MG tablet Commonly known as:  XYZAL Take 1 tablet (5 mg total) by mouth  every evening.   lisinopril 5 MG tablet Commonly known as:  PRINIVIL,ZESTRIL Take 1 tablet (5 mg total) by mouth daily.   LYRICA 150 MG capsule Generic drug:  pregabalin TAKE ONE CAPSULE BY MOUTH THREE TIMES DAILY AS NEEDED   multivitamin tablet Take 1 tablet by mouth daily.   ondansetron 8 MG disintegrating tablet Commonly known as:  ZOFRAN ODT Take 1 tablet (8 mg total) by mouth every 8 (eight) hours as needed for nausea or vomiting.   oxyCODONE 5 MG immediate release tablet Commonly known as:  Oxy IR/ROXICODONE Take 1 tablet (5  mg total) by mouth every 4 (four) hours as needed for severe pain.   oxyCODONE 5 MG immediate release tablet Commonly known as:  Oxy IR/ROXICODONE Take 1 tablet (5 mg total) by mouth every 4 (four) hours as needed for severe pain.   oxyCODONE-acetaminophen 5-325 MG tablet Commonly known as:  PERCOCET/ROXICET Take 1-2 tablets by mouth every 4 (four) hours as needed for severe pain.   polyethylene glycol packet Commonly known as:  MIRALAX / GLYCOLAX Take 17 g by mouth daily.   silver sulfADIAZINE 1 % cream Commonly known as:  SILVADENE Apply 1 application topically daily.   SLOW FE 160 (50 Fe) MG Tbcr SR tablet Generic drug:  ferrous sulfate Take 160 mg by mouth daily.   vitamin C 500 MG tablet Commonly known as:  ASCORBIC ACID Take 500 mg by mouth daily.   Vitamin D3 5000 units Caps Take 1 capsule by mouth 2 (two) times daily.            Durable Medical Equipment        Start     Ordered   10/01/16 1146  For home use only DME Walker rolling  Once    Question:  Patient needs a walker to treat with the following condition  Answer:  Mobility impaired   10/01/16 1148   10/01/16 1145  For home use only DME 3 n 1  Once     10/01/16 1148   10/01/16 1145  For home use only DME Walker platform  Once    Question:  Patient needs a walker to treat with the following condition  Answer:  Mobility impaired   10/01/16 1148      Diagnostic Studies: Xr Foot 2 Views Left  Result Date: 09/04/2016 2 view radiographs of the left foot shows the silver nitrate goes all the way down to bone there is no destructive bony changes so this seems more like an acute osteomyelitis of the left calcaneus. She does have enthesopathy with calcification on  of the calcaneus.   Patient benefited maximally from their hospital stay and there were no complications.     Disposition: 01-Home or Self Care Discharge Instructions    Apply cam walker    Complete by:  As directed    Laterality:  Left    Call MD / Call 911    Complete by:  As directed    If you experience chest pain or shortness of breath, CALL 911 and be transported to the hospital emergency room.  If you develope a fever above 101 F, pus (white drainage) or increased drainage or redness at the wound, or calf pain, call your surgeon's office.   Call MD / Call 911    Complete by:  As directed    If you experience chest pain or shortness of breath, CALL 911 and be transported to the hospital emergency room.  If you  develope a fever above 101 F, pus (white drainage) or increased drainage or redness at the wound, or calf pain, call your surgeon's office.   Constipation Prevention    Complete by:  As directed    Drink plenty of fluids.  Prune juice may be helpful.  You may use a stool softener, such as Colace (over the counter) 100 mg twice a day.  Use MiraLax (over the counter) for constipation as needed.   Constipation Prevention    Complete by:  As directed    Drink plenty of fluids.  Prune juice may be helpful.  You may use a stool softener, such as Colace (over the counter) 100 mg twice a day.  Use MiraLax (over the counter) for constipation as needed.   Diet - low sodium heart healthy    Complete by:  As directed    Diet - low sodium heart healthy    Complete by:  As directed    Increase activity slowly as tolerated    Complete by:  As directed    Increase activity slowly as tolerated    Complete by:  As directed    Non weight bearing    Complete by:  As directed    Laterality:  left   Extremity:  Lower     Follow-up Information    [name], MD Follow up in 1 week(s).   Specialty:  Orthopedic Surgery Contact information: [place] [address] [address] [facility] up.   Why:  home health physical therapy Contact information: [place] [address] [phone]        Inc[facility] - [facility] Follow up.   Why:  to  rent knee scooter Contact information: [address] [place] [address] [phone]            Signed: [name] 10/02/2016, 6:36 AM

## 2016-10-05 ENCOUNTER — Telehealth: Payer: Self-pay | Admitting: Family Medicine

## 2016-10-05 NOTE — Telephone Encounter (Signed)
Caller name: Gerald Stabs  Relation to pt: PT from [place]  Call back number: [phone]   Reason for call:  wanted to inform PCP patient declined PT assistance stating she knows what to do, please advise.

## 2016-10-06 ENCOUNTER — Ambulatory Visit (INDEPENDENT_AMBULATORY_CARE_PROVIDER_SITE_OTHER): Payer: BLUE CROSS/BLUE SHIELD | Admitting: Family

## 2016-10-06 VITALS — Ht 62.0 in | Wt 195.0 lb

## 2016-10-06 DIAGNOSIS — M869 Osteomyelitis, unspecified: Secondary | ICD-10-CM
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
## 2016-10-09 ENCOUNTER — Telehealth (INDEPENDENT_AMBULATORY_CARE_PROVIDER_SITE_OTHER): Payer: Self-pay | Admitting: Physical Medicine and Rehabilitation

## 2016-10-09 NOTE — Telephone Encounter (Signed)
Right L4 trans esi but if no help that is long lasting will need to review with Spine surgeon

## 2016-10-09 NOTE — Telephone Encounter (Signed)
Ortho ordered it , not me

## 2016-10-10 NOTE — Telephone Encounter (Addendum)
[name] [name] stated he was calling to make [name] [name] aware so it would be in patient's chart.  Pt was seen by Ortho on 10/06/16.

## 2016-10-17 ENCOUNTER — Ambulatory Visit (INDEPENDENT_AMBULATORY_CARE_PROVIDER_SITE_OTHER): Payer: BLUE CROSS/BLUE SHIELD | Admitting: Physical Medicine and Rehabilitation

## 2016-10-17 ENCOUNTER — Encounter (INDEPENDENT_AMBULATORY_CARE_PROVIDER_SITE_OTHER): Payer: Self-pay

## 2016-10-17 ENCOUNTER — Encounter (INDEPENDENT_AMBULATORY_CARE_PROVIDER_SITE_OTHER): Payer: Self-pay | Admitting: Physical Medicine and Rehabilitation

## 2016-10-17 VITALS — BP 113/65 | HR 97

## 2016-10-17 DIAGNOSIS — M5416 Radiculopathy, lumbar region: Secondary | ICD-10-CM

## 2016-10-17 MED ORDER — METHYLPREDNISOLONE ACETATE 80 MG/ML IJ SUSP
80.0000 mg | Freq: Once | INTRAMUSCULAR | Status: AC
  Administered 2016-10-17: 80 mg

## 2016-10-17 MED ORDER — LIDOCAINE HCL (PF) 1 % IJ SOLN: 0.3300 mL | Freq: Once | INTRAMUSCULAR | Status: DC

## 2016-10-17 NOTE — Procedures (Signed)
Lumbosacral Transforaminal Epidural Steroid Injection - Infraneural Approach with Fluoroscopic Guidance  Patient: [name] [name]      Date of Birth: 1955/11/13 MRN: [id number] PCP: Ann Held, DO      Visit Date: 10/17/2016   Universal Protocol:    Date/Time: 12/19/174:14 PM  Consent Given By: the patient  Position: PRONE   Additional Comments: Vital signs were monitored before and after the procedure. Patient was prepped and draped in the usual sterile fashion. The correct patient, procedure, and site was verified.   Injection Procedure Details:  Procedure Site One Meds Administered:  Meds ordered this encounter  Medications  . lidocaine (PF) (XYLOCAINE) 1 % injection 0.3 mL  . methylPREDNISolone acetate (DEPO-MEDROL) injection 80 mg      Laterality: Right  Location/Site:  L4-L5  Needle size: 22 G  Needle type: Spinal  Needle Placement: Transforaminal  Findings:  -Contrast Used: 1 mL iohexol 180 mg iodine/mL   -Comments: Excellent flow of contrast along the nerve and into the epidural space.  Procedure Details: After squaring off the end-plates of the desired vertebral level to get a true AP view, the C-arm was obliqued to the painful side so that the superior articulating process is positioned about 1/3 the length of the inferior endplate.  The needle was aimed toward the junction of the superior articular process and the transverse process of the inferior vertebrae. The needle's initial entry is in the lower third of the foramen through Kambin's triangle. The soft tissues overlying this target were infiltrated with 2-3 ml. of 1% Lidocaine without Epinephrine.  The spinal needle was then inserted and advanced toward the target using a "trajectory" view along the fluoroscope beam.  Under AP and lateral visualization, the needle was advanced so it did not puncture dura and did not traverse medially beyond the 6 o'clock position of the pedicle. Bi-planar  projections were used to confirm position. Aspiration was confirmed to be negative for CSF and/or blood. A 1-2 ml. volume of Isovue-250 was injected and flow of contrast was noted at each level. Radiographs were obtained for documentation purposes.   After attaining the desired flow of contrast documented above, a 0.5 to 1.0 ml test dose of 0.25% Marcaine was injected into each respective transforaminal space.  The patient was observed for 90 seconds post injection.  After no sensory deficits were reported, and normal lower extremity motor function was noted,   the above injectate was administered so that equal amounts of the injectate were placed at each foramen (level) into the transforaminal epidural space.   Additional Comments:  The patient tolerated the procedure well Dressing: Band-Aid    Post-procedure details: Patient was observed during the procedure. Post-procedure instructions were reviewed.  Patient left the clinic in stable condition.
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
## 2016-10-17 NOTE — Patient Instructions (Signed)

## 2016-10-24 ENCOUNTER — Other Ambulatory Visit: Payer: Self-pay | Admitting: Family Medicine

## 2016-10-24 NOTE — Telephone Encounter (Signed)
Left message with gentleman to call back.   Looks like she got 2 rxs from Dr. Sharol Given office on 09/29/16 and also has an appointment with him on 10/26/16.  She should be calling them for refill.

## 2016-10-24 NOTE — Telephone Encounter (Signed)
Self. Refill request for oxyCODONE

## 2016-10-26 ENCOUNTER — Encounter (INDEPENDENT_AMBULATORY_CARE_PROVIDER_SITE_OTHER): Payer: Self-pay | Admitting: Orthopedic Surgery

## 2016-10-26 ENCOUNTER — Ambulatory Visit (INDEPENDENT_AMBULATORY_CARE_PROVIDER_SITE_OTHER): Payer: BLUE CROSS/BLUE SHIELD | Admitting: Family

## 2016-10-26 ENCOUNTER — Encounter (INDEPENDENT_AMBULATORY_CARE_PROVIDER_SITE_OTHER): Payer: Self-pay

## 2016-10-26 DIAGNOSIS — E11621 Type 2 diabetes mellitus with foot ulcer: Secondary | ICD-10-CM

## 2016-10-26 DIAGNOSIS — L97424 Non-pressure chronic ulcer of left heel and midfoot with necrosis of bone: Secondary | ICD-10-CM

## 2016-10-26 DIAGNOSIS — M86172 Other acute osteomyelitis, left ankle and foot: Secondary | ICD-10-CM

## 2016-10-26 MED ORDER — OXYCODONE HCL 5 MG PO TABS: 5.0000 mg | ORAL_TABLET | Freq: Three times a day (TID) | ORAL | 0 refills | Status: DC | PRN
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
## 2016-11-03 ENCOUNTER — Ambulatory Visit (INDEPENDENT_AMBULATORY_CARE_PROVIDER_SITE_OTHER): Payer: BLUE CROSS/BLUE SHIELD | Admitting: Family

## 2016-11-03 ENCOUNTER — Other Ambulatory Visit: Payer: Self-pay | Admitting: Family Medicine

## 2016-11-03 ENCOUNTER — Encounter (INDEPENDENT_AMBULATORY_CARE_PROVIDER_SITE_OTHER): Payer: Self-pay

## 2016-11-03 VITALS — Ht 62.0 in | Wt 195.0 lb

## 2016-11-03 DIAGNOSIS — F32A Depression, unspecified: Secondary | ICD-10-CM

## 2016-11-03 DIAGNOSIS — F329 Major depressive disorder, single episode, unspecified: Secondary | ICD-10-CM

## 2016-11-03 DIAGNOSIS — L97424 Non-pressure chronic ulcer of left heel and midfoot with necrosis of bone: Secondary | ICD-10-CM

## 2016-11-03 DIAGNOSIS — E785 Hyperlipidemia, unspecified: Secondary | ICD-10-CM

## 2016-11-03 DIAGNOSIS — E11621 Type 2 diabetes mellitus with foot ulcer: Secondary | ICD-10-CM

## 2016-11-03 DIAGNOSIS — M86172 Other acute osteomyelitis, left ankle and foot: Secondary | ICD-10-CM
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
## 2016-11-07 ENCOUNTER — Other Ambulatory Visit: Payer: Self-pay | Admitting: Family Medicine

## 2016-11-07 MED ORDER — HYDROCODONE-ACETAMINOPHEN 5-325 MG PO TABS: 1.0000 | ORAL_TABLET | ORAL | 0 refills | Status: DC | PRN

## 2016-11-07 NOTE — Telephone Encounter (Signed)
Last ov 02/02/16. Last fill 09/26/16 #60 0. Last screen 01/10/13. No controlled substance contract on file. Please advise. LB

## 2016-11-07 NOTE — Telephone Encounter (Signed)
Patient is requesting a refill of HYDROcodone-acetaminophen (NORCO/VICODIN) 5-325 MG tablet  She states that her foot doctor, Dr. Sharol Given, stated that she was able to go back on the medication. Please advise  Phone: [phone]

## 2016-11-08 NOTE — Telephone Encounter (Signed)
Will get from Dr. Etter [name] on 11/10/15 when she return to office

## 2016-11-09 ENCOUNTER — Other Ambulatory Visit: Payer: Self-pay | Admitting: Family Medicine

## 2016-11-10 ENCOUNTER — Other Ambulatory Visit: Payer: Self-pay | Admitting: Family Medicine

## 2016-11-10 DIAGNOSIS — G894 Chronic pain syndrome: Secondary | ICD-10-CM

## 2016-11-10 MED ORDER — HYDROCODONE-ACETAMINOPHEN 5-325 MG PO TABS: 1.0000 | ORAL_TABLET | ORAL | 0 refills | Status: DC | PRN

## 2016-11-10 NOTE — Telephone Encounter (Signed)
Please advise 

## 2016-11-10 NOTE — Telephone Encounter (Signed)
Spoke with patient.  She has been in a lot pain and is taking as directed.  Advised her that she has to come pickup the prescription and do a UDS.  Patient will come in today to pickup.  Rx and contract is at front for pickup.

## 2016-11-10 NOTE — Telephone Encounter (Signed)
[name], DO  You 21 minutes ago (3:58 PM)   Indication for chronic opioid: chronic pain  Medication and dose: vicodin 5/325  # pills per month: #60  Last UDS date: 07/2015  Pain contract signed (Y/N): needs new one  Date narcotic database last reviewed (include red flags): 11/10/2016   Ok to refills x1 (Routing comment)

## 2016-11-10 NOTE — Telephone Encounter (Signed)
Pt called in to follow up on Rx. She is trying to pick up before the weekend if possible.    Please assist pt further.     CB: [phone]

## 2016-11-14 ENCOUNTER — Encounter: Payer: Self-pay | Admitting: Family Medicine

## 2016-11-17 ENCOUNTER — Ambulatory Visit (INDEPENDENT_AMBULATORY_CARE_PROVIDER_SITE_OTHER): Payer: BLUE CROSS/BLUE SHIELD | Admitting: Family

## 2016-11-17 VITALS — Ht 62.0 in | Wt 195.0 lb

## 2016-11-17 DIAGNOSIS — M86172 Other acute osteomyelitis, left ankle and foot: Secondary | ICD-10-CM
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
## 2016-11-29 ENCOUNTER — Ambulatory Visit (INDEPENDENT_AMBULATORY_CARE_PROVIDER_SITE_OTHER): Payer: BLUE CROSS/BLUE SHIELD | Admitting: Orthopedic Surgery

## 2016-11-30 ENCOUNTER — Ambulatory Visit (INDEPENDENT_AMBULATORY_CARE_PROVIDER_SITE_OTHER): Payer: BLUE CROSS/BLUE SHIELD | Admitting: Orthopedic Surgery

## 2016-11-30 DIAGNOSIS — M86172 Other acute osteomyelitis, left ankle and foot: Secondary | ICD-10-CM

## 2016-11-30 DIAGNOSIS — L02612 Cutaneous abscess of left foot: Secondary | ICD-10-CM | POA: Insufficient documentation

## 2016-11-30 MED ORDER — DOXYCYCLINE HYCLATE 100 MG PO TABS: 100.0000 mg | ORAL_TABLET | Freq: Two times a day (BID) | ORAL | 0 refills | Status: DC
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
## 2016-12-06 ENCOUNTER — Telehealth: Payer: Self-pay | Admitting: Family Medicine

## 2016-12-06 NOTE — Telephone Encounter (Signed)
Patient Name: [name] [name]  DOB: 09-Feb-1956    Initial Comment Caller states having dizzy spells, yesterday chest felt warm, feeling like she is going to pass out and she is nauseated   Nurse Assessment  Nurse: Thad Ranger, RN, Langley Gauss Date/Time (Eastern Time): 12/06/2016 2:49:17 PM  Confirm and document reason for call. If symptomatic, describe symptoms. ---Pt states she has been dizzy and vomiting x 2 wks. Gait is unsteady. No fever.  Does the patient have any new or worsening symptoms? ---Yes  Will a triage be completed? ---Yes  Related visit to physician within the last 2 weeks? ---No  Does the PT have any chronic conditions? (i.e. diabetes, asthma, etc.) ---Yes  List chronic conditions. ---Diabetes, Cirrhosis, Kidney dx.  Is this a behavioral health or substance abuse call? ---No     Guidelines    Guideline Title Affirmed Question Affirmed Notes  Dizziness - Vertigo SEVERE dizziness (vertigo) (e.g., unable to walk without assistance)    Final Disposition User   Go to ED Now (or PCP triage) Thad Ranger, RN, [place] to take a list of all meds both Rx and OTC w/her to the ER. No appts available today at [facility].   Referrals  [facility] - ED   Disagree/Comply: Comply

## 2016-12-07 ENCOUNTER — Telehealth: Payer: Self-pay | Admitting: Family Medicine

## 2016-12-07 ENCOUNTER — Encounter (INDEPENDENT_AMBULATORY_CARE_PROVIDER_SITE_OTHER): Payer: Self-pay | Admitting: Orthopedic Surgery

## 2016-12-07 ENCOUNTER — Ambulatory Visit (INDEPENDENT_AMBULATORY_CARE_PROVIDER_SITE_OTHER): Payer: BLUE CROSS/BLUE SHIELD | Admitting: Family

## 2016-12-07 VITALS — Ht 62.0 in | Wt 195.0 lb

## 2016-12-07 DIAGNOSIS — L02612 Cutaneous abscess of left foot: Secondary | ICD-10-CM

## 2016-12-07 DIAGNOSIS — G894 Chronic pain syndrome: Secondary | ICD-10-CM

## 2016-12-07 NOTE — Telephone Encounter (Signed)
Caller name: Relationship to patient: Self Can be reached: [phone]  Pharmacy:   Reason for call: Request refill on oxyCODONE-acetaminophen (PERCOCET/ROXICET) 5-325 MG tablet

## 2016-12-07 NOTE — Telephone Encounter (Signed)
Last ov 07/17/16 Next ov nothing scheduled UDS next due 05/04/17 Contract signed
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
## 2016-12-07 NOTE — Telephone Encounter (Signed)
Indication for chronic opioid: chronic pain Medication and dose: oxycodone 5/325 # pills per month:  Last UDS date: 04/2016 Pain contract signed (Y/N): yes Date narcotic database last reviewed (include red flags): 12/07/2016

## 2016-12-08 MED ORDER — OXYCODONE-ACETAMINOPHEN 5-325 MG PO TABS: 1.0000 | ORAL_TABLET | ORAL | 0 refills | Status: DC | PRN

## 2016-12-08 NOTE — Addendum Note (Signed)
Addended by: Kem Boroughs D on: 12/08/2016 04:30 PM   Modules accepted: Orders

## 2016-12-08 NOTE — Telephone Encounter (Signed)
No note in chart noted that patient went to the ER.  Pt was seen by Ortho yesterday.  Called to follow up with her. Pt stated she did not go to the ER. She wanted to see Dr.Lowne, but there were no openings.  She has been staying home resting.  She continues to experience the dizziness, but vomiting has stopped.  Her husband also has a virus, but has been keeping an eye her.  Pt was strongly advised to be seen.  She was advised if she doesn't want to go to the ER to at least come in to the office to be seen.  Offered her an appt with another provider.  Pt declined, stating she only wants to see Dr. Etter Sjogren.  An appt was scheduled with Dr. Etter Sjogren for Monday, Feb 12th.  Pt was advised if symptoms worsen to go to the ER. She stated understanding and agreed to comply.    Message routed to PCP for FYI.

## 2016-12-08 NOTE — Telephone Encounter (Signed)
Patient notified that waiting on provider signature and she can be on her way to pickup before 5pm.

## 2016-12-08 NOTE — Telephone Encounter (Signed)
Patient checking on the status of message below

## 2016-12-08 NOTE — Telephone Encounter (Signed)
noted 

## 2016-12-11 ENCOUNTER — Ambulatory Visit: Payer: BLUE CROSS/BLUE SHIELD | Admitting: Family Medicine

## 2016-12-11 ENCOUNTER — Ambulatory Visit (INDEPENDENT_AMBULATORY_CARE_PROVIDER_SITE_OTHER): Payer: BLUE CROSS/BLUE SHIELD | Admitting: Specialist

## 2016-12-11 NOTE — Telephone Encounter (Signed)
Patient picked up 12/07/16

## 2016-12-14 ENCOUNTER — Encounter (INDEPENDENT_AMBULATORY_CARE_PROVIDER_SITE_OTHER): Payer: Self-pay | Admitting: Orthopedic Surgery

## 2016-12-14 ENCOUNTER — Ambulatory Visit (INDEPENDENT_AMBULATORY_CARE_PROVIDER_SITE_OTHER): Payer: BLUE CROSS/BLUE SHIELD | Admitting: Orthopedic Surgery

## 2016-12-14 VITALS — Ht 62.0 in | Wt 195.0 lb

## 2016-12-14 DIAGNOSIS — L97411 Non-pressure chronic ulcer of right heel and midfoot limited to breakdown of skin: Secondary | ICD-10-CM

## 2016-12-14 DIAGNOSIS — E11621 Type 2 diabetes mellitus with foot ulcer: Secondary | ICD-10-CM
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
## 2017-01-05 ENCOUNTER — Ambulatory Visit (INDEPENDENT_AMBULATORY_CARE_PROVIDER_SITE_OTHER): Payer: BLUE CROSS/BLUE SHIELD | Admitting: Family Medicine

## 2017-01-05 ENCOUNTER — Encounter: Payer: Self-pay | Admitting: Family Medicine

## 2017-01-05 VITALS — BP 116/66 | HR 92 | Temp 98.1°F | Resp 16 | Ht 62.0 in | Wt 188.8 lb

## 2017-01-05 DIAGNOSIS — G894 Chronic pain syndrome: Secondary | ICD-10-CM | POA: Diagnosis not present

## 2017-01-05 DIAGNOSIS — E785 Hyperlipidemia, unspecified: Secondary | ICD-10-CM | POA: Diagnosis not present

## 2017-01-05 DIAGNOSIS — R1030 Lower abdominal pain, unspecified: Secondary | ICD-10-CM | POA: Diagnosis not present

## 2017-01-05 LAB — CBC WITH DIFFERENTIAL/PLATELET
BASOS PCT: 0.9 % (ref 0.0–3.0)
Basophils Absolute: 0.1 10*3/uL (ref 0.0–0.1)
EOS PCT: 2 % (ref 0.0–5.0)
Eosinophils Absolute: 0.1 10*3/uL (ref 0.0–0.7)
HCT: 36 % (ref 36.0–46.0)
HEMOGLOBIN: 11.7 g/dL — AB (ref 12.0–15.0)
LYMPHS ABS: 1.8 10*3/uL (ref 0.7–4.0)
Lymphocytes Relative: 24.4 % (ref 12.0–46.0)
MCHC: 32.4 g/dL (ref 30.0–36.0)
MCV: 79.9 fl (ref 78.0–100.0)
MONO ABS: 0.6 10*3/uL (ref 0.1–1.0)
Monocytes Relative: 8 % (ref 3.0–12.0)
NEUTROS ABS: 4.7 10*3/uL (ref 1.4–7.7)
Neutrophils Relative %: 64.7 % (ref 43.0–77.0)
PLATELETS: 164 10*3/uL (ref 150.0–400.0)
RBC: 4.5 Mil/uL (ref 3.87–5.11)
RDW: 17.2 % — AB (ref 11.5–15.5)
WBC: 7.3 10*3/uL (ref 4.0–10.5)

## 2017-01-05 LAB — POC URINALSYSI DIPSTICK (AUTOMATED)
Bilirubin, UA: NEGATIVE
Glucose, UA: NEGATIVE
Ketones, UA: NEGATIVE
LEUKOCYTES UA: NEGATIVE
NITRITE UA: NEGATIVE
PH UA: 6
RBC UA: NEGATIVE
Spec Grav, UA: 1.025
UROBILINOGEN UA: NEGATIVE

## 2017-01-05 LAB — COMPREHENSIVE METABOLIC PANEL
ALT: 15 U/L (ref 0–35)
AST: 17 U/L (ref 0–37)
Albumin: 3.4 g/dL — ABNORMAL LOW (ref 3.5–5.2)
Alkaline Phosphatase: 104 U/L (ref 39–117)
BILIRUBIN TOTAL: 0.6 mg/dL (ref 0.2–1.2)
BUN: 22 mg/dL (ref 6–23)
CHLORIDE: 102 meq/L (ref 96–112)
CO2: 29 meq/L (ref 19–32)
CREATININE: 1.57 mg/dL — AB (ref 0.40–1.20)
Calcium: 9.5 mg/dL (ref 8.4–10.5)
GFR: 35.6 mL/min — ABNORMAL LOW (ref >60.00)
GLUCOSE: 264 mg/dL — AB (ref 70–99)
Potassium: 4.2 mEq/L (ref 3.5–5.1)
SODIUM: 137 meq/L (ref 135–145)
Total Protein: 7 g/dL (ref 6.0–8.3)

## 2017-01-05 LAB — LIPID PANEL
CHOLESTEROL: 103 mg/dL (ref 0–200)
HDL: 34.8 mg/dL — ABNORMAL LOW (ref >39.00)
LDL CALC: 39 mg/dL (ref 0–99)
NonHDL: 67.89
Total CHOL/HDL Ratio: 3
Triglycerides: 145 mg/dL (ref 0.0–149.0)
VLDL: 29 mg/dL (ref 0.0–40.0)

## 2017-01-05 LAB — LIPASE: Lipase: 62 U/L — ABNORMAL HIGH (ref 11.0–59.0)

## 2017-01-05 MED ORDER — ONDANSETRON 8 MG PO TBDP: 8.0000 mg | ORAL_TABLET | Freq: Three times a day (TID) | ORAL | 0 refills | Status: DC | PRN

## 2017-01-05 MED ORDER — OXYCODONE-ACETAMINOPHEN 5-325 MG PO TABS: 1.0000 | ORAL_TABLET | ORAL | 0 refills | Status: DC | PRN
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
## 2017-01-05 NOTE — Assessment & Plan Note (Signed)
?   Secondary to trulicity Check labs Pt will call endo to see about changing meds Pain is worse on Mon and tues after taking injection

## 2017-01-05 NOTE — Progress Notes (Signed)
Pre visit review using our clinic review tool, if applicable. No additional management support is needed unless otherwise documented below in the visit note. 

## 2017-01-05 NOTE — Patient Instructions (Signed)
Abdominal Pain, Adult Abdominal pain can be caused by many things. Often, abdominal pain is not serious and it gets better with no treatment or by being treated at home. However, sometimes abdominal pain is serious. Your health care provider will do a medical history and a physical exam to try to determine the cause of your abdominal pain. Follow these instructions at home:  Take over-the-counter and prescription medicines only as told by your health care provider. Do not take a laxative unless told by your health care provider.  Drink enough fluid to keep your urine clear or pale yellow.  Watch your condition for any changes.  Keep all follow-up visits as told by your health care provider. This is important. Contact a health care provider if:  Your abdominal pain changes or gets worse.  You are not hungry or you lose weight without trying.  You are constipated or have diarrhea for more than 2-3 days.  You have pain when you urinate or have a bowel movement.  Your abdominal pain wakes you up at night.  Your pain gets worse with meals, after eating, or with certain foods.  You are throwing up and cannot keep anything down.  You have a fever. Get help right away if:  Your pain does not go away as soon as your health care provider told you to expect.  You cannot stop throwing up.  Your pain is only in areas of the abdomen, such as the right side or the left lower portion of the abdomen.  You have bloody or black stools, or stools that look like tar.  You have severe pain, cramping, or bloating in your abdomen.  You have signs of dehydration, such as:  Dark urine, very little urine, or no urine.  Cracked lips.  Dry mouth.  Sunken eyes.  Sleepiness.  Weakness. This information is not intended to replace advice given to you by your health care provider. Make sure you discuss any questions you have with your health care provider. Document Released: 07/26/2005 Document  Revised: 05/05/2016 Document Reviewed: 03/29/2016 Elsevier Interactive Patient Education  2017 [employer] Inc.  

## 2017-01-11 ENCOUNTER — Telehealth: Payer: Self-pay

## 2017-01-11 ENCOUNTER — Encounter: Payer: Self-pay | Admitting: Family Medicine

## 2017-01-11 ENCOUNTER — Ambulatory Visit (INDEPENDENT_AMBULATORY_CARE_PROVIDER_SITE_OTHER): Payer: BLUE CROSS/BLUE SHIELD | Admitting: Family Medicine

## 2017-01-11 VITALS — BP 128/62 | HR 90 | Temp 98.1°F | Resp 16 | Ht 62.0 in | Wt 189.0 lb

## 2017-01-11 DIAGNOSIS — R109 Unspecified abdominal pain: Secondary | ICD-10-CM | POA: Diagnosis not present

## 2017-01-11 LAB — COMPREHENSIVE METABOLIC PANEL
ALT: 14 U/L (ref 0–35)
AST: 18 U/L (ref 0–37)
Albumin: 3.6 g/dL (ref 3.5–5.2)
Alkaline Phosphatase: 102 U/L (ref 39–117)
BUN: 19 mg/dL (ref 6–23)
CO2: 26 mEq/L (ref 19–32)
Calcium: 9.6 mg/dL (ref 8.4–10.5)
Chloride: 104 mEq/L (ref 96–112)
Creatinine, Ser: 1.46 mg/dL — ABNORMAL HIGH (ref 0.40–1.20)
GFR: 38.71 mL/min — ABNORMAL LOW (ref >60.00)
Glucose, Bld: 344 mg/dL — ABNORMAL HIGH (ref 70–99)
Potassium: 4.4 mEq/L (ref 3.5–5.1)
Sodium: 138 mEq/L (ref 135–145)
Total Bilirubin: 0.5 mg/dL (ref 0.2–1.2)
Total Protein: 7.1 g/dL (ref 6.0–8.3)

## 2017-01-11 LAB — URINALYSIS
Bilirubin Urine: NEGATIVE
Ketones, ur: NEGATIVE
Leukocytes, UA: NEGATIVE
Nitrite: NEGATIVE
Specific Gravity, Urine: <=1.005 — AB (ref 1.000–1.030)
Total Protein, Urine: NEGATIVE
Urine Glucose: >=1000 — AB
Urobilinogen, UA: 0.2 (ref 0.0–1.0)
pH: 6 (ref 5.0–8.0)

## 2017-01-11 LAB — AMYLASE: Amylase: 36 U/L (ref 27–131)

## 2017-01-11 LAB — LIPASE: Lipase: 59 U/L (ref 11.0–59.0)

## 2017-01-11 NOTE — Patient Instructions (Signed)
Pancreatitis probably due to trulicity   Acute Pancreatitis Acute pancreatitis is a condition in which the pancreas suddenly gets irritated and swollen (has inflammation). The pancreas is a large gland behind the stomach. It makes enzymes that help to digest food. The pancreas also makes hormones that help to control your blood sugar. Acute pancreatitis happens when the enzymes attack the pancreas and damage it. Most attacks last a couple of days and can cause serious problems. Follow these instructions at home: Eating and drinking   Follow instructions from your doctor about diet. You may need to:  Avoid alcohol.  Limit how much fat is in your diet.  Eat small meals often. Avoid eating big meals.  Drink enough fluid to keep your pee (urine) clear or pale yellow.  Do not drink alcohol if it caused your condition. General instructions   Take over-the-counter and prescription medicines only as told by your doctor.  Do not use any tobacco products. These include cigarettes, chewing tobacco, and e-cigarettes. If you need help quitting, ask your doctor.  Get plenty of rest.  If directed, check your blood sugar at home as told by your doctor.  Keep all follow-up visits as told by your doctor. This is important. Contact a doctor if:  You do not get better as quickly as expected.  You have new symptoms.  Your symptoms get worse.  You have lasting pain or weakness.  You continue to feel sick to your stomach (nauseous).  You get better and then you have another pain attack.  You have a fever. Get help right away if:  You cannot eat or keep fluids down.  Your pain becomes very bad.  Your skin or the white part of your eyes turns yellow (jaundice).  You throw up (vomit).  You feel dizzy or you pass out (faint).  Your blood sugar is high (over 300 mg/dL). This information is not intended to replace advice given to you by your health care provider. Make sure you discuss any  questions you have with your health care provider. Document Released: 04/03/2008 Document Revised: 03/23/2016 Document Reviewed: 07/20/2015 Elsevier Interactive Patient Education  2017 [employer].
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
## 2017-01-11 NOTE — Telephone Encounter (Signed)
-----   Message from Philemon Kingdom, MD sent at 01/11/2017  2:23 PM EDT ----- Dear Kendrick Fries, Thank you for letting me know! Yes, I agree to increase Apidra and I will try to see her as soon as possible (she was lost for f/u with me).  Almyra Free, please add her to the schedule when I have the first opening. Until then, stay off Trulicity and may need to continue to increase Apidra even further to 18 units before meals. Thank you, CG   ----- Message ----- From: Ann Held, DO Sent: 01/11/2017   1:18 PM To: Philemon Kingdom, MD  I saw pt again today.   She has been c/o of abd pain and labs +pancreatitis I had her stop trulicity and asked her to call your office after her last visit -- she said she did and explained to a nurse what was going on and she told her to come back here.   It does not look like anyone notified you ----  I asked her to inc her apidra to 15 u bid until she talked to you --- Hope that was ok.   Kendrick Fries

## 2017-01-11 NOTE — Progress Notes (Signed)
Pre visit review using our clinic review tool, if applicable. No additional management support is needed unless otherwise documented below in the visit note. 

## 2017-01-11 NOTE — Telephone Encounter (Signed)
Patient called in regarding the problems of the medication she is on. Spoke with Dr.Gherghe, advised that we would try to get her scheduled next week or placed on the waiting list. Patient is going for a CT scan tomorrow and we will wait for the results of that.

## 2017-01-11 NOTE — Assessment & Plan Note (Signed)
Probably pancreatitis from trulicity trulicity stopped Inc apidra to 15 mg bid  f/u endo Check ct today since pain is same per pt although pt looks better Check labs If pain worsens go to ER Con't clear liquid diet

## 2017-01-11 NOTE — Telephone Encounter (Signed)
Called and LVM advising patient we had her scheduled with Dr.Gherghe, to call to cancel if that time did not work for her. Also advised to stay off Trulicity until then, and to increase Apidra to 18 units if needed,

## 2017-01-12 ENCOUNTER — Encounter (HOSPITAL_BASED_OUTPATIENT_CLINIC_OR_DEPARTMENT_OTHER): Payer: Self-pay

## 2017-01-12 ENCOUNTER — Other Ambulatory Visit: Payer: Self-pay | Admitting: Family Medicine

## 2017-01-12 ENCOUNTER — Ambulatory Visit (HOSPITAL_BASED_OUTPATIENT_CLINIC_OR_DEPARTMENT_OTHER)
Admission: RE | Admit: 2017-01-12 | Discharge: 2017-01-12 | Disposition: A | Discharge status: Home / Self Care | Payer: BLUE CROSS/BLUE SHIELD | Source: Ambulatory Visit | Attending: Family Medicine | Admitting: Family Medicine

## 2017-01-12 DIAGNOSIS — R109 Unspecified abdominal pain: Secondary | ICD-10-CM | POA: Diagnosis not present

## 2017-01-12 DIAGNOSIS — I700 Atherosclerosis of aorta: Secondary | ICD-10-CM | POA: Insufficient documentation

## 2017-01-12 DIAGNOSIS — K7460 Unspecified cirrhosis of liver: Secondary | ICD-10-CM

## 2017-01-12 HISTORY — DX: Unspecified asthma, uncomplicated: J45.909

## 2017-01-12 MED ORDER — IOPAMIDOL (ISOVUE-300) INJECTION 61%
100.0000 mL | Freq: Once | INTRAVENOUS | Status: AC | PRN
  Administered 2017-01-12: 80 mL via INTRAVENOUS

## 2017-01-12 MED ORDER — IOPAMIDOL (ISOVUE-300) INJECTION 61%: 80.0000 mL | Freq: Once | INTRAVENOUS | Status: DC | PRN

## 2017-01-16 ENCOUNTER — Ambulatory Visit: Payer: BLUE CROSS/BLUE SHIELD | Admitting: Internal Medicine

## 2017-01-24 ENCOUNTER — Encounter (INDEPENDENT_AMBULATORY_CARE_PROVIDER_SITE_OTHER): Payer: Self-pay | Admitting: Family

## 2017-01-24 ENCOUNTER — Ambulatory Visit (INDEPENDENT_AMBULATORY_CARE_PROVIDER_SITE_OTHER): Payer: BLUE CROSS/BLUE SHIELD | Admitting: Family

## 2017-01-24 VITALS — Ht 62.0 in | Wt 189.0 lb

## 2017-01-24 DIAGNOSIS — L97424 Non-pressure chronic ulcer of left heel and midfoot with necrosis of bone: Secondary | ICD-10-CM

## 2017-01-24 DIAGNOSIS — L97421 Non-pressure chronic ulcer of left heel and midfoot limited to breakdown of skin: Secondary | ICD-10-CM

## 2017-01-24 DIAGNOSIS — E11621 Type 2 diabetes mellitus with foot ulcer: Secondary | ICD-10-CM

## 2017-01-24 MED ORDER — DOXYCYCLINE HYCLATE 100 MG PO TABS: 100.0000 mg | ORAL_TABLET | Freq: Two times a day (BID) | ORAL | 0 refills | Status: DC
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
## 2017-01-31 ENCOUNTER — Other Ambulatory Visit: Payer: Self-pay | Admitting: Family Medicine

## 2017-01-31 DIAGNOSIS — J302 Other seasonal allergic rhinitis: Secondary | ICD-10-CM

## 2017-02-01 ENCOUNTER — Ambulatory Visit (INDEPENDENT_AMBULATORY_CARE_PROVIDER_SITE_OTHER): Payer: BLUE CROSS/BLUE SHIELD | Admitting: Orthopedic Surgery

## 2017-02-01 ENCOUNTER — Encounter (INDEPENDENT_AMBULATORY_CARE_PROVIDER_SITE_OTHER): Payer: Self-pay | Admitting: Orthopedic Surgery

## 2017-02-01 DIAGNOSIS — L97421 Non-pressure chronic ulcer of left heel and midfoot limited to breakdown of skin: Secondary | ICD-10-CM | POA: Diagnosis not present
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
## 2017-02-12 ENCOUNTER — Other Ambulatory Visit: Payer: Self-pay | Admitting: Family Medicine

## 2017-02-12 MED ORDER — OXYCODONE-ACETAMINOPHEN 5-325 MG PO TABS: 1.0000 | ORAL_TABLET | Freq: Four times a day (QID) | ORAL | 0 refills | Status: DC | PRN

## 2017-02-12 NOTE — Telephone Encounter (Signed)
Patient is due for a refill on Oxycodone 5-325, let patient know that Dr. Carollee Herter was out of the office

## 2017-02-12 NOTE — Telephone Encounter (Signed)
Last ov 01/11/17 Last fill 01/12/17 #90 0 Last UDS 11/14/16 Please advise. LB

## 2017-02-13 NOTE — Telephone Encounter (Signed)
Called informed the patient hardcopy for Oxycodone has been filled and at the front for pickup. Dr. Lorelei Pont filled in the absence of PCP.

## 2017-02-15 ENCOUNTER — Ambulatory Visit (INDEPENDENT_AMBULATORY_CARE_PROVIDER_SITE_OTHER): Payer: Self-pay

## 2017-02-15 ENCOUNTER — Encounter (INDEPENDENT_AMBULATORY_CARE_PROVIDER_SITE_OTHER): Payer: Self-pay

## 2017-02-15 ENCOUNTER — Ambulatory Visit (INDEPENDENT_AMBULATORY_CARE_PROVIDER_SITE_OTHER): Payer: BLUE CROSS/BLUE SHIELD | Admitting: Orthopedic Surgery

## 2017-02-15 DIAGNOSIS — M79672 Pain in left foot: Secondary | ICD-10-CM

## 2017-02-15 DIAGNOSIS — G8929 Other chronic pain: Secondary | ICD-10-CM | POA: Diagnosis not present
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
## 2017-02-20 ENCOUNTER — Ambulatory Visit: Payer: BLUE CROSS/BLUE SHIELD | Admitting: Internal Medicine

## 2017-02-23 ENCOUNTER — Ambulatory Visit (INDEPENDENT_AMBULATORY_CARE_PROVIDER_SITE_OTHER): Payer: BLUE CROSS/BLUE SHIELD | Admitting: Internal Medicine

## 2017-02-23 ENCOUNTER — Encounter (INDEPENDENT_AMBULATORY_CARE_PROVIDER_SITE_OTHER): Payer: Self-pay

## 2017-02-23 ENCOUNTER — Encounter: Payer: Self-pay | Admitting: Internal Medicine

## 2017-02-23 VITALS — BP 100/58 | HR 108 | Ht 61.0 in | Wt 182.4 lb

## 2017-02-23 DIAGNOSIS — R1310 Dysphagia, unspecified: Secondary | ICD-10-CM | POA: Diagnosis not present

## 2017-02-23 DIAGNOSIS — K7460 Unspecified cirrhosis of liver: Secondary | ICD-10-CM

## 2017-02-23 DIAGNOSIS — K5900 Constipation, unspecified: Secondary | ICD-10-CM

## 2017-02-23 DIAGNOSIS — K7581 Nonalcoholic steatohepatitis (NASH): Secondary | ICD-10-CM | POA: Diagnosis not present

## 2017-02-23 DIAGNOSIS — Z1211 Encounter for screening for malignant neoplasm of colon: Secondary | ICD-10-CM

## 2017-02-23 MED ORDER — NA SULFATE-K SULFATE-MG SULF 17.5-3.13-1.6 GM/177ML PO SOLN: 1.0000 | Freq: Once | ORAL | 0 refills | Status: AC
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
## 2017-02-23 NOTE — Patient Instructions (Signed)
You have been scheduled for an endoscopy and colonoscopy. Please follow the written instructions given to you at your visit today. Please pick up your prep supplies at the pharmacy within the next 1-3 days. If you use inhalers (even only as needed), please bring them with you on the day of your procedure.

## 2017-03-13 ENCOUNTER — Encounter (INDEPENDENT_AMBULATORY_CARE_PROVIDER_SITE_OTHER): Payer: Self-pay

## 2017-03-13 ENCOUNTER — Encounter (INDEPENDENT_AMBULATORY_CARE_PROVIDER_SITE_OTHER): Payer: Self-pay | Admitting: Orthopedic Surgery

## 2017-03-13 ENCOUNTER — Ambulatory Visit (INDEPENDENT_AMBULATORY_CARE_PROVIDER_SITE_OTHER): Payer: BLUE CROSS/BLUE SHIELD | Admitting: Orthopedic Surgery

## 2017-03-13 VITALS — Ht 61.0 in | Wt 182.0 lb

## 2017-03-13 DIAGNOSIS — L97411 Non-pressure chronic ulcer of right heel and midfoot limited to breakdown of skin: Secondary | ICD-10-CM | POA: Diagnosis not present

## 2017-03-13 DIAGNOSIS — L97421 Non-pressure chronic ulcer of left heel and midfoot limited to breakdown of skin: Secondary | ICD-10-CM

## 2017-03-13 DIAGNOSIS — E11621 Type 2 diabetes mellitus with foot ulcer: Secondary | ICD-10-CM | POA: Diagnosis not present
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
## 2017-03-14 ENCOUNTER — Ambulatory Visit (INDEPENDENT_AMBULATORY_CARE_PROVIDER_SITE_OTHER): Payer: BLUE CROSS/BLUE SHIELD | Admitting: Orthopedic Surgery

## 2017-03-15 ENCOUNTER — Telehealth: Payer: Self-pay | Admitting: Family Medicine

## 2017-03-15 MED ORDER — OXYCODONE HCL 5 MG PO TABS: 5.0000 mg | ORAL_TABLET | Freq: Three times a day (TID) | ORAL | 0 refills | Status: DC | PRN

## 2017-03-15 NOTE — Telephone Encounter (Signed)
Called let message to pickup hardcopy at the front desk.

## 2017-03-15 NOTE — Telephone Encounter (Signed)
Requesting:   Oxycodone Contract    11/14/2016 UDS   Low risk on 05/14/2017 Last OV    01/11/2017---- no future appt Last Refill   #90 no refills on 02/12/2017  Please Advise

## 2017-03-15 NOTE — Telephone Encounter (Signed)
Refill x1 

## 2017-03-15 NOTE — Telephone Encounter (Signed)
Patient informed to pickup hardcopy at the front desk.

## 2017-03-15 NOTE — Telephone Encounter (Signed)
°  Relation to EX:MDYJ Call back [place]:  Reason for call:  Patient requesting a refill oxyCODONE (OXY IR/ROXICODONE) 5 MG immediate release tablet

## 2017-03-16 ENCOUNTER — Ambulatory Visit (INDEPENDENT_AMBULATORY_CARE_PROVIDER_SITE_OTHER): Payer: BLUE CROSS/BLUE SHIELD | Admitting: Orthopaedic Surgery

## 2017-03-22 ENCOUNTER — Telehealth: Payer: Self-pay | Admitting: Family Medicine

## 2017-03-22 DIAGNOSIS — G894 Chronic pain syndrome: Secondary | ICD-10-CM

## 2017-03-22 NOTE — Telephone Encounter (Signed)
Refill x1 

## 2017-03-22 NOTE — Telephone Encounter (Signed)
Caller name: Relationship to patient: Self Can be reached: [phone]  Pharmacy:  Reason for call: Patient request a Rx for her pain medication because she will be out of town after Friday and will need some pain medication

## 2017-03-23 MED ORDER — HYDROCODONE-ACETAMINOPHEN 5-325 MG PO TABS: 1.0000 | ORAL_TABLET | ORAL | 0 refills | Status: DC | PRN

## 2017-03-23 NOTE — Telephone Encounter (Signed)
Patient informed to pickup hardcopy at the front desk.

## 2017-03-28 MED ORDER — FLUCONAZOLE 150 MG PO TAB
ORAL_TABLET | Freq: Once | 1 refills
Start: 2017-03-28 — End: ?

## 2017-03-29 MED ORDER — AMLODIPINE 10 MG PO TAB
ORAL_TABLET | Freq: Every day | 1 refills | Status: DC
Start: 2017-03-29 — End: 2017-08-22

## 2017-04-02 ENCOUNTER — Ambulatory Visit (INDEPENDENT_AMBULATORY_CARE_PROVIDER_SITE_OTHER): Payer: BLUE CROSS/BLUE SHIELD | Admitting: Orthopedic Surgery

## 2017-04-04 ENCOUNTER — Other Ambulatory Visit: Payer: Self-pay | Admitting: Family Medicine

## 2017-04-05 NOTE — Telephone Encounter (Signed)
Faxed hardcopy for Lyrica to [facility]

## 2017-04-06 ENCOUNTER — Emergency Department (HOSPITAL_BASED_OUTPATIENT_CLINIC_OR_DEPARTMENT_OTHER): Payer: BLUE CROSS/BLUE SHIELD

## 2017-04-06 ENCOUNTER — Encounter (HOSPITAL_BASED_OUTPATIENT_CLINIC_OR_DEPARTMENT_OTHER): Payer: Self-pay

## 2017-04-06 ENCOUNTER — Inpatient Hospital Stay (HOSPITAL_COMMUNITY): Payer: BLUE CROSS/BLUE SHIELD

## 2017-04-06 ENCOUNTER — Inpatient Hospital Stay (HOSPITAL_BASED_OUTPATIENT_CLINIC_OR_DEPARTMENT_OTHER)
Admission: EM | Admit: 2017-04-06 | Discharge: 2017-04-11 | DRG: 638 | Disposition: A | Discharge status: Home Health | Payer: BLUE CROSS/BLUE SHIELD | Attending: Internal Medicine | Admitting: Internal Medicine

## 2017-04-06 DIAGNOSIS — N183 Chronic kidney disease, stage 3 unspecified: Secondary | ICD-10-CM | POA: Diagnosis present

## 2017-04-06 DIAGNOSIS — E1122 Type 2 diabetes mellitus with diabetic chronic kidney disease: Secondary | ICD-10-CM | POA: Diagnosis present

## 2017-04-06 DIAGNOSIS — F329 Major depressive disorder, single episode, unspecified: Secondary | ICD-10-CM | POA: Diagnosis present

## 2017-04-06 DIAGNOSIS — M79673 Pain in unspecified foot: Secondary | ICD-10-CM

## 2017-04-06 DIAGNOSIS — E669 Obesity, unspecified: Secondary | ICD-10-CM | POA: Diagnosis present

## 2017-04-06 DIAGNOSIS — I252 Old myocardial infarction: Secondary | ICD-10-CM | POA: Diagnosis not present

## 2017-04-06 DIAGNOSIS — E876 Hypokalemia: Secondary | ICD-10-CM | POA: Diagnosis not present

## 2017-04-06 DIAGNOSIS — E11621 Type 2 diabetes mellitus with foot ulcer: Secondary | ICD-10-CM | POA: Diagnosis present

## 2017-04-06 DIAGNOSIS — M79672 Pain in left foot: Secondary | ICD-10-CM | POA: Diagnosis not present

## 2017-04-06 DIAGNOSIS — E1151 Type 2 diabetes mellitus with diabetic peripheral angiopathy without gangrene: Secondary | ICD-10-CM | POA: Diagnosis present

## 2017-04-06 DIAGNOSIS — F419 Anxiety disorder, unspecified: Secondary | ICD-10-CM | POA: Diagnosis present

## 2017-04-06 DIAGNOSIS — J45909 Unspecified asthma, uncomplicated: Secondary | ICD-10-CM | POA: Diagnosis present

## 2017-04-06 DIAGNOSIS — K7581 Nonalcoholic steatohepatitis (NASH): Secondary | ICD-10-CM | POA: Diagnosis present

## 2017-04-06 DIAGNOSIS — E1142 Type 2 diabetes mellitus with diabetic polyneuropathy: Secondary | ICD-10-CM | POA: Diagnosis present

## 2017-04-06 DIAGNOSIS — Z79899 Other long term (current) drug therapy: Secondary | ICD-10-CM

## 2017-04-06 DIAGNOSIS — E11628 Type 2 diabetes mellitus with other skin complications: Secondary | ICD-10-CM | POA: Diagnosis present

## 2017-04-06 DIAGNOSIS — D649 Anemia, unspecified: Secondary | ICD-10-CM | POA: Diagnosis not present

## 2017-04-06 DIAGNOSIS — M797 Fibromyalgia: Secondary | ICD-10-CM | POA: Diagnosis present

## 2017-04-06 DIAGNOSIS — K219 Gastro-esophageal reflux disease without esophagitis: Secondary | ICD-10-CM | POA: Diagnosis present

## 2017-04-06 DIAGNOSIS — K7460 Unspecified cirrhosis of liver: Secondary | ICD-10-CM | POA: Diagnosis present

## 2017-04-06 DIAGNOSIS — I129 Hypertensive chronic kidney disease with stage 1 through stage 4 chronic kidney disease, or unspecified chronic kidney disease: Secondary | ICD-10-CM | POA: Diagnosis present

## 2017-04-06 DIAGNOSIS — E1165 Type 2 diabetes mellitus with hyperglycemia: Secondary | ICD-10-CM

## 2017-04-06 DIAGNOSIS — Z6834 Body mass index (BMI) 34.0-34.9, adult: Secondary | ICD-10-CM

## 2017-04-06 DIAGNOSIS — Z89431 Acquired absence of right foot: Secondary | ICD-10-CM

## 2017-04-06 DIAGNOSIS — R739 Hyperglycemia, unspecified: Secondary | ICD-10-CM

## 2017-04-06 DIAGNOSIS — I10 Essential (primary) hypertension: Secondary | ICD-10-CM

## 2017-04-06 DIAGNOSIS — K7469 Other cirrhosis of liver: Secondary | ICD-10-CM | POA: Diagnosis not present

## 2017-04-06 DIAGNOSIS — L02612 Cutaneous abscess of left foot: Secondary | ICD-10-CM | POA: Diagnosis present

## 2017-04-06 DIAGNOSIS — E871 Hypo-osmolality and hyponatremia: Secondary | ICD-10-CM | POA: Diagnosis present

## 2017-04-06 DIAGNOSIS — D631 Anemia in chronic kidney disease: Secondary | ICD-10-CM | POA: Diagnosis present

## 2017-04-06 DIAGNOSIS — I951 Orthostatic hypotension: Secondary | ICD-10-CM

## 2017-04-06 DIAGNOSIS — Z794 Long term (current) use of insulin: Secondary | ICD-10-CM | POA: Diagnosis not present

## 2017-04-06 DIAGNOSIS — L97429 Non-pressure chronic ulcer of left heel and midfoot with unspecified severity: Secondary | ICD-10-CM | POA: Diagnosis present

## 2017-04-06 DIAGNOSIS — F4310 Post-traumatic stress disorder, unspecified: Secondary | ICD-10-CM | POA: Diagnosis present

## 2017-04-06 DIAGNOSIS — R42 Dizziness and giddiness: Secondary | ICD-10-CM

## 2017-04-06 DIAGNOSIS — E7800 Pure hypercholesterolemia, unspecified: Secondary | ICD-10-CM | POA: Diagnosis present

## 2017-04-06 DIAGNOSIS — Z940 Kidney transplant status: Secondary | ICD-10-CM

## 2017-04-06 DIAGNOSIS — L089 Local infection of the skin and subcutaneous tissue, unspecified: Secondary | ICD-10-CM | POA: Diagnosis not present

## 2017-04-06 HISTORY — DX: Long term (current) use of insulin: E11.65

## 2017-04-06 LAB — CBC WITH DIFFERENTIAL/PLATELET
BASOS ABS: 0 10*3/uL (ref 0.0–0.1)
BASOS PCT: 0 %
EOS ABS: 0 10*3/uL (ref 0.0–0.7)
EOS PCT: 0 %
HCT: 32.3 % — ABNORMAL LOW (ref 36.0–46.0)
Hemoglobin: 10.9 g/dL — ABNORMAL LOW (ref 12.0–15.0)
LYMPHS PCT: 17 %
Lymphs Abs: 0.9 10*3/uL (ref 0.7–4.0)
MCH: 26.8 pg (ref 26.0–34.0)
MCHC: 33.7 g/dL (ref 30.0–36.0)
MCV: 79.4 fL (ref 78.0–100.0)
MONO ABS: 0.7 10*3/uL (ref 0.1–1.0)
Monocytes Relative: 14 %
Neutro Abs: 3.5 10*3/uL (ref 1.7–7.7)
Neutrophils Relative %: 69 %
PLATELETS: 152 10*3/uL (ref 150–400)
RBC: 4.07 MIL/uL (ref 3.87–5.11)
RDW: 14.7 % (ref 11.5–15.5)
WBC: 5.1 10*3/uL (ref 4.0–10.5)

## 2017-04-06 LAB — BASIC METABOLIC PANEL
Anion gap: 11 (ref 5–15)
BUN: 33 mg/dL — AB (ref 6–20)
CALCIUM: 9 mg/dL (ref 8.9–10.3)
CO2: 30 mmol/L (ref 22–32)
Chloride: 83 mmol/L — ABNORMAL LOW (ref 101–111)
Creatinine, Ser: 1.9 mg/dL — ABNORMAL HIGH (ref 0.44–1.00)
GFR calc Af Amer: 32 mL/min — ABNORMAL LOW (ref >60)
GFR, EST NON AFRICAN AMERICAN: 27 mL/min — AB (ref >60)
Glucose, Bld: 647 mg/dL (ref 65–99)
POTASSIUM: 4.1 mmol/L (ref 3.5–5.1)
SODIUM: 124 mmol/L — AB (ref 135–145)

## 2017-04-06 LAB — GLUCOSE, CAPILLARY
GLUCOSE-CAPILLARY: 360 mg/dL — AB (ref 65–99)
GLUCOSE-CAPILLARY: 314 mg/dL — AB (ref 65–99)
Glucose-Capillary: 276 mg/dL — ABNORMAL HIGH (ref 65–99)
Glucose-Capillary: 172 mg/dL — ABNORMAL HIGH (ref 65–99)

## 2017-04-06 LAB — URINALYSIS, ROUTINE W REFLEX MICROSCOPIC
Bilirubin Urine: NEGATIVE
KETONES UR: NEGATIVE mg/dL
Leukocytes, UA: NEGATIVE
Nitrite: NEGATIVE
PROTEIN: NEGATIVE mg/dL
Specific Gravity, Urine: 1.011 (ref 1.005–1.030)
pH: 6 (ref 5.0–8.0)

## 2017-04-06 LAB — RAPID URINE DRUG SCREEN, HOSP PERFORMED
Amphetamines: NOT DETECTED
BENZODIAZEPINES: NOT DETECTED
Barbiturates: NOT DETECTED
COCAINE: NOT DETECTED
OPIATES: POSITIVE — AB
Tetrahydrocannabinol: NOT DETECTED

## 2017-04-06 LAB — SEDIMENTATION RATE: SED RATE: 83 mm/h — AB (ref 0–22)

## 2017-04-06 LAB — MRSA PCR SCREENING: MRSA by PCR: NEGATIVE

## 2017-04-06 LAB — I-STAT CG4 LACTIC ACID, ED: Lactic Acid, Venous: 1.83 mmol/L (ref 0.5–1.9)

## 2017-04-06 LAB — CBG MONITORING, ED: Glucose-Capillary: 410 mg/dL — ABNORMAL HIGH (ref 65–99)

## 2017-04-06 LAB — LACTIC ACID, PLASMA: LACTIC ACID, VENOUS: 1.7 mmol/L (ref 0.5–1.9)

## 2017-04-06 MED ORDER — SODIUM CHLORIDE 0.9 % IV SOLN
INTRAVENOUS | Status: DC
Start: 2017-04-06 — End: 2017-04-07
  Administered 2017-04-06 – 2017-04-07 (×2): via INTRAVENOUS

## 2017-04-06 MED ORDER — ACETAMINOPHEN 650 MG RE SUPP: 650.0000 mg | Freq: Four times a day (QID) | RECTAL | Status: DC | PRN

## 2017-04-06 MED ORDER — DEXTROSE-NACL 5-0.45 % IV SOLN: INTRAVENOUS | Status: DC

## 2017-04-06 MED ORDER — ONDANSETRON HCL 4 MG/2ML IJ SOLN
4.0000 mg | Freq: Once | INTRAMUSCULAR | Status: AC
  Administered 2017-04-06: 4 mg via INTRAVENOUS
  Filled 2017-04-06: qty 2

## 2017-04-06 MED ORDER — OXYCODONE HCL 5 MG PO TABS
5.0000 mg | ORAL_TABLET | Freq: Four times a day (QID) | ORAL | Status: DC | PRN
  Administered 2017-04-07 – 2017-04-11 (×9): 5 mg via ORAL
  Filled 2017-04-06 (×9): qty 1

## 2017-04-06 MED ORDER — SODIUM CHLORIDE 0.9 % IV BOLUS (SEPSIS)
2000.0000 mL | Freq: Once | INTRAVENOUS | Status: AC
  Administered 2017-04-06: 2000 mL via INTRAVENOUS

## 2017-04-06 MED ORDER — POLYETHYLENE GLYCOL 3350 17 G PO PACK
17.0000 g | PACK | Freq: Every day | ORAL | Status: DC
  Administered 2017-04-08 – 2017-04-11 (×4): 17 g via ORAL
  Filled 2017-04-06 (×5): qty 1

## 2017-04-06 MED ORDER — PREGABALIN 75 MG PO CAPS
150.0000 mg | ORAL_CAPSULE | Freq: Three times a day (TID) | ORAL | Status: DC | PRN
  Administered 2017-04-08 – 2017-04-09 (×2): 150 mg via ORAL
  Filled 2017-04-06: qty 2
  Filled 2017-04-06: qty 6

## 2017-04-06 MED ORDER — ENOXAPARIN SODIUM 40 MG/0.4ML ~~LOC~~ SOLN
40.0000 mg | SUBCUTANEOUS | Status: DC
  Administered 2017-04-07 – 2017-04-11 (×5): 40 mg via SUBCUTANEOUS
  Filled 2017-04-06 (×5): qty 0.4

## 2017-04-06 MED ORDER — INSULIN REGULAR BOLUS VIA INFUSION
0.0000 [IU] | Freq: Three times a day (TID) | INTRAVENOUS | Status: DC
  Administered 2017-04-07: 4.2 [IU] via INTRAVENOUS
  Administered 2017-04-07: 6.6 [IU] via INTRAVENOUS
  Filled 2017-04-06: qty 10

## 2017-04-06 MED ORDER — DEXTROSE 50 % IV SOLN: 25.0000 mL | INTRAVENOUS | Status: DC | PRN

## 2017-04-06 MED ORDER — ATORVASTATIN CALCIUM 10 MG PO TABS
10.0000 mg | ORAL_TABLET | Freq: Every day | ORAL | Status: DC
  Administered 2017-04-07 – 2017-04-11 (×5): 10 mg via ORAL
  Filled 2017-04-06 (×5): qty 1

## 2017-04-06 MED ORDER — VANCOMYCIN HCL IN DEXTROSE 1-5 GM/200ML-% IV SOLN
1000.0000 mg | INTRAVENOUS | Status: DC
  Administered 2017-04-06: 1000 mg via INTRAVENOUS
  Filled 2017-04-06: qty 200

## 2017-04-06 MED ORDER — SODIUM CHLORIDE 0.9 % IV BOLUS (SEPSIS)
1000.0000 mL | Freq: Once | INTRAVENOUS | Status: AC
  Administered 2017-04-06: 1000 mL via INTRAVENOUS

## 2017-04-06 MED ORDER — ONDANSETRON HCL 4 MG/2ML IJ SOLN: 4.0000 mg | Freq: Four times a day (QID) | INTRAMUSCULAR | Status: DC | PRN

## 2017-04-06 MED ORDER — FENOFIBRATE 160 MG PO TABS
160.0000 mg | ORAL_TABLET | Freq: Every day | ORAL | Status: DC
  Administered 2017-04-07 – 2017-04-11 (×5): 160 mg via ORAL
  Filled 2017-04-06 (×5): qty 1

## 2017-04-06 MED ORDER — AZTREONAM 1 G IJ SOLR
INTRAMUSCULAR | Status: AC
  Administered 2017-04-06: 1 g via INTRAVENOUS
  Filled 2017-04-06: qty 1

## 2017-04-06 MED ORDER — ONDANSETRON 8 MG PO TBDP
8.0000 mg | ORAL_TABLET | Freq: Three times a day (TID) | ORAL | Status: DC | PRN
  Filled 2017-04-06: qty 1

## 2017-04-06 MED ORDER — ONDANSETRON HCL 4 MG PO TABS
4.0000 mg | ORAL_TABLET | Freq: Four times a day (QID) | ORAL | Status: DC | PRN
  Administered 2017-04-06: 4 mg via ORAL
  Filled 2017-04-06: qty 1

## 2017-04-06 MED ORDER — DULOXETINE HCL 60 MG PO CPEP
60.0000 mg | ORAL_CAPSULE | Freq: Every day | ORAL | Status: DC
  Administered 2017-04-07 – 2017-04-11 (×5): 60 mg via ORAL
  Filled 2017-04-06 (×5): qty 1

## 2017-04-06 MED ORDER — SODIUM CHLORIDE 0.9 % IV SOLN
INTRAVENOUS | Status: DC
  Administered 2017-04-07: 2.3 [IU]/h via INTRAVENOUS
  Filled 2017-04-06: qty 1

## 2017-04-06 MED ORDER — SODIUM CHLORIDE 0.9 % IV SOLN
INTRAVENOUS | Status: DC
  Administered 2017-04-06: 5.4 [IU]/h via INTRAVENOUS
  Filled 2017-04-06 (×2): qty 1

## 2017-04-06 MED ORDER — FERROUS SULFATE 325 (65 FE) MG PO TABS
325.0000 mg | ORAL_TABLET | Freq: Every day | ORAL | Status: DC
  Administered 2017-04-07 – 2017-04-11 (×5): 325 mg via ORAL
  Filled 2017-04-06 (×5): qty 1

## 2017-04-06 MED ORDER — HYDROMORPHONE HCL 1 MG/ML IJ SOLN
1.0000 mg | INTRAMUSCULAR | Status: AC | PRN
  Administered 2017-04-06 – 2017-04-07 (×3): 1 mg via INTRAVENOUS
  Filled 2017-04-06 (×3): qty 1

## 2017-04-06 MED ORDER — DEXTROSE 5 % IV SOLN
1.0000 g | Freq: Three times a day (TID) | INTRAVENOUS | Status: DC
  Filled 2017-04-06: qty 1

## 2017-04-06 MED ORDER — ACETAMINOPHEN 325 MG PO TABS: 650.0000 mg | ORAL_TABLET | Freq: Four times a day (QID) | ORAL | Status: DC | PRN

## 2017-04-06 MED ORDER — FENTANYL CITRATE (PF) 100 MCG/2ML IJ SOLN
50.0000 ug | Freq: Once | INTRAMUSCULAR | Status: AC
  Administered 2017-04-06: 50 ug via INTRAVENOUS
  Filled 2017-04-06: qty 2
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
## 2017-04-06 NOTE — Progress Notes (Signed)
Pharmacy Antibiotic Note  [name] [name] is a 61 y.o. female admitted on 04/06/2017 with foot wound.  Pharmacy has been consulted for aztreonam and vancomycin dosing. Patient afebrile and WBC wnl. LA slightly elevated at 1.83. SCr 1.90 with estimated CrCl 30-35 mL/min. Of note, patient has anaphylaxis to penicillin.   Plan: Aztreonam 1g IV q8hr Vancomycin 1g IV q24hr  Vancomycin goal trough 15-20 mcg/mL Vancomycin trough at [facility] and as needed Monitor renal function, clinical picture, and culture data F/u length of therapy and de-escalation   Height: 5' 2"  (157.5 cm) Weight: 182 lb (82.6 kg) IBW/kg (Calculated) : 50.1  Temp (24hrs), Avg:98.4 F (36.9 C), Min:98.2 F (36.8 C), Max:98.6 F (37 C)   Recent Labs Lab 04/06/17 1338 04/06/17 1508  WBC 5.1  --   CREATININE 1.90*  --   LATICACIDVEN  --  1.83    Estimated Creatinine Clearance: 31 mL/min (A) (by C-G formula based on SCr of 1.9 mg/dL (H)).    Allergies  Allergen Reactions  . Fish-Derived Products Hives, Shortness Of Breath, Swelling and Rash    Hives get in throat causing trouble breathing  . Mushroom Extract Complex Anaphylaxis  . Penicillins Anaphylaxis and Swelling  . Rosemary Oil Anaphylaxis  . Shellfish Allergy Hives, Shortness Of Breath, Swelling and Rash  . Tomato Hives and Shortness Of Breath    Hives in throat causes her trouble breathing  . Acetaminophen     Gi upset  . Aloe Vera     hives  . Other     Antimicrobials this admission: 6/8 Vanc >>  6/8 Aztreonam >>   Dose adjustments this admission: n/a   Microbiology results: pending   Argie Ramming, PharmD Pharmacy Resident  Pager [phone] 04/06/17 4:34 PM

## 2017-04-06 NOTE — ED Triage Notes (Signed)
C/o dizziness "for a couple months" multiple falls-has not sought medical attention-presents to triage in w/c-post op shoe on left foot

## 2017-04-06 NOTE — Progress Notes (Signed)
Left unit to MRI - for left foot

## 2017-04-06 NOTE — Progress Notes (Signed)
Dr. Almeta Monas at bedside reviewing the treatment regimen with patient and pt's husband.

## 2017-04-06 NOTE — Plan of Care (Signed)
  PMHx: recent osteomyelitis, renal insufficiency, PTSD, MRSA, hypertension, GERD, for myalgia, fatty liver, diabetes, depression, hx of MI  CC: lightheaded, L heel pain - sx for about a wek XR foot: suspected hindfoot abscss Plan: abx by dm foot wound protocol, Dr. Sharol Given to see pt on arrival, insulin drip for hyperglycemia  Status: [place]
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
## 2017-04-06 NOTE — Progress Notes (Signed)
Back from MRI- patient is in stable condition - blood sugar 172 and insulin drip is infusing at 3.4 units/hr

## 2017-04-07 ENCOUNTER — Encounter (HOSPITAL_COMMUNITY): Payer: Self-pay

## 2017-04-07 DIAGNOSIS — L089 Local infection of the skin and subcutaneous tissue, unspecified: Secondary | ICD-10-CM

## 2017-04-07 DIAGNOSIS — E1165 Type 2 diabetes mellitus with hyperglycemia: Secondary | ICD-10-CM

## 2017-04-07 DIAGNOSIS — L02612 Cutaneous abscess of left foot: Secondary | ICD-10-CM

## 2017-04-07 DIAGNOSIS — D649 Anemia, unspecified: Secondary | ICD-10-CM

## 2017-04-07 DIAGNOSIS — Z794 Long term (current) use of insulin: Secondary | ICD-10-CM

## 2017-04-07 DIAGNOSIS — N183 Chronic kidney disease, stage 3 (moderate): Secondary | ICD-10-CM

## 2017-04-07 DIAGNOSIS — R739 Hyperglycemia, unspecified: Secondary | ICD-10-CM

## 2017-04-07 DIAGNOSIS — E11628 Type 2 diabetes mellitus with other skin complications: Principal | ICD-10-CM

## 2017-04-07 DIAGNOSIS — M797 Fibromyalgia: Secondary | ICD-10-CM

## 2017-04-07 LAB — GLUCOSE, CAPILLARY
GLUCOSE-CAPILLARY: 162 mg/dL — AB (ref 65–99)
GLUCOSE-CAPILLARY: 137 mg/dL — AB (ref 65–99)
GLUCOSE-CAPILLARY: 167 mg/dL — AB (ref 65–99)
GLUCOSE-CAPILLARY: 339 mg/dL — AB (ref 65–99)
GLUCOSE-CAPILLARY: 391 mg/dL — AB (ref 65–99)
Glucose-Capillary: 130 mg/dL — ABNORMAL HIGH (ref 65–99)
Glucose-Capillary: 150 mg/dL — ABNORMAL HIGH (ref 65–99)
Glucose-Capillary: 179 mg/dL — ABNORMAL HIGH (ref 65–99)
Glucose-Capillary: 182 mg/dL — ABNORMAL HIGH (ref 65–99)
Glucose-Capillary: 200 mg/dL — ABNORMAL HIGH (ref 65–99)
Glucose-Capillary: 224 mg/dL — ABNORMAL HIGH (ref 65–99)
Glucose-Capillary: 302 mg/dL — ABNORMAL HIGH (ref 65–99)
Glucose-Capillary: 305 mg/dL — ABNORMAL HIGH (ref 65–99)
Glucose-Capillary: 311 mg/dL — ABNORMAL HIGH (ref 65–99)

## 2017-04-07 LAB — COMPREHENSIVE METABOLIC PANEL
ALT: 15 U/L (ref 14–54)
ANION GAP: 7 (ref 5–15)
AST: 18 U/L (ref 15–41)
Albumin: 2.4 g/dL — ABNORMAL LOW (ref 3.5–5.0)
Alkaline Phosphatase: 92 U/L (ref 38–126)
BUN: 18 mg/dL (ref 6–20)
CHLORIDE: 101 mmol/L (ref 101–111)
CO2: 26 mmol/L (ref 22–32)
CREATININE: 1.42 mg/dL — AB (ref 0.44–1.00)
Calcium: 8 mg/dL — ABNORMAL LOW (ref 8.9–10.3)
GFR, EST AFRICAN AMERICAN: 45 mL/min — AB (ref >60)
GFR, EST NON AFRICAN AMERICAN: 39 mL/min — AB (ref >60)
Glucose, Bld: 159 mg/dL — ABNORMAL HIGH (ref 65–99)
POTASSIUM: 3.1 mmol/L — AB (ref 3.5–5.1)
SODIUM: 134 mmol/L — AB (ref 135–145)
Total Bilirubin: 0.7 mg/dL (ref 0.3–1.2)
Total Protein: 6.1 g/dL — ABNORMAL LOW (ref 6.5–8.1)

## 2017-04-07 LAB — CBC
HCT: 29.8 % — ABNORMAL LOW (ref 36.0–46.0)
HEMOGLOBIN: 9.5 g/dL — AB (ref 12.0–15.0)
MCH: 25.5 pg — AB (ref 26.0–34.0)
MCHC: 31.9 g/dL (ref 30.0–36.0)
MCV: 80.1 fL (ref 78.0–100.0)
PLATELETS: 140 10*3/uL — AB (ref 150–400)
RBC: 3.72 MIL/uL — AB (ref 3.87–5.11)
RDW: 15 % (ref 11.5–15.5)
WBC: 6.9 10*3/uL (ref 4.0–10.5)

## 2017-04-07 LAB — LACTIC ACID, PLASMA
LACTIC ACID, VENOUS: 0.8 mmol/L (ref 0.5–1.9)
LACTIC ACID, VENOUS: 1.1 mmol/L (ref 0.5–1.9)

## 2017-04-07 LAB — C-REACTIVE PROTEIN: CRP: 13.4 mg/dL — ABNORMAL HIGH (ref <1.0)

## 2017-04-07 LAB — HIV ANTIBODY (ROUTINE TESTING W REFLEX): HIV SCREEN 4TH GENERATION: NONREACTIVE

## 2017-04-07 MED ORDER — SODIUM CHLORIDE 0.9 % IV SOLN
INTRAVENOUS | Status: DC
  Administered 2017-04-07: 19:00:00 via INTRAVENOUS

## 2017-04-07 MED ORDER — COLLAGENASE 250 UNIT/GM EX OINT
TOPICAL_OINTMENT | Freq: Every day | CUTANEOUS | Status: DC
  Administered 2017-04-08 – 2017-04-11 (×4): via TOPICAL
  Filled 2017-04-07: qty 30

## 2017-04-07 MED ORDER — ACETAMINOPHEN 650 MG RE SUPP
325.0000 mg | Freq: Four times a day (QID) | RECTAL | Status: DC | PRN
Start: 2017-04-07 — End: 2017-04-11

## 2017-04-07 MED ORDER — INSULIN GLARGINE 100 UNIT/ML ~~LOC~~ SOLN
40.0000 [IU] | Freq: Every day | SUBCUTANEOUS | Status: DC
  Administered 2017-04-08 – 2017-04-10 (×3): 40 [IU] via SUBCUTANEOUS
  Filled 2017-04-07 (×4): qty 0.4

## 2017-04-07 MED ORDER — INSULIN ASPART 100 UNIT/ML ~~LOC~~ SOLN
8.0000 [IU] | Freq: Three times a day (TID) | SUBCUTANEOUS | Status: DC
  Administered 2017-04-08: 8 [IU] via SUBCUTANEOUS

## 2017-04-07 MED ORDER — INSULIN ASPART 100 UNIT/ML ~~LOC~~ SOLN
0.0000 [IU] | Freq: Every day | SUBCUTANEOUS | Status: DC
  Administered 2017-04-07: 5 [IU] via SUBCUTANEOUS

## 2017-04-07 MED ORDER — ACETAMINOPHEN 500 MG PO TABS: 500.0000 mg | ORAL_TABLET | Freq: Four times a day (QID) | ORAL | Status: DC | PRN

## 2017-04-07 MED ORDER — GLUCERNA SHAKE PO LIQD
237.0000 mL | Freq: Two times a day (BID) | ORAL | Status: DC
  Administered 2017-04-07 – 2017-04-11 (×4): 237 mL via ORAL

## 2017-04-07 MED ORDER — INSULIN GLARGINE 100 UNIT/ML ~~LOC~~ SOLN
20.0000 [IU] | Freq: Once | SUBCUTANEOUS | Status: AC
  Administered 2017-04-07: 20 [IU] via SUBCUTANEOUS
  Filled 2017-04-07: qty 0.2

## 2017-04-07 MED ORDER — INSULIN ASPART 100 UNIT/ML ~~LOC~~ SOLN
0.0000 [IU] | Freq: Three times a day (TID) | SUBCUTANEOUS | Status: DC
  Administered 2017-04-07 (×2): 15 [IU] via SUBCUTANEOUS
  Administered 2017-04-08: 11 [IU] via SUBCUTANEOUS
  Administered 2017-04-08: 7 [IU] via SUBCUTANEOUS
  Administered 2017-04-08: 11 [IU] via SUBCUTANEOUS
  Administered 2017-04-09 – 2017-04-10 (×4): 7 [IU] via SUBCUTANEOUS
  Administered 2017-04-10: 11 [IU] via SUBCUTANEOUS
  Administered 2017-04-11: 4 [IU] via SUBCUTANEOUS
  Administered 2017-04-11: 3 [IU] via SUBCUTANEOUS

## 2017-04-07 MED ORDER — INSULIN GLARGINE 100 UNIT/ML ~~LOC~~ SOLN
30.0000 [IU] | Freq: Every day | SUBCUTANEOUS | Status: AC
  Administered 2017-04-07: 30 [IU] via SUBCUTANEOUS
  Filled 2017-04-07: qty 0.3

## 2017-04-07 MED ORDER — POTASSIUM CHLORIDE CRYS ER 20 MEQ PO TBCR
40.0000 meq | EXTENDED_RELEASE_TABLET | Freq: Two times a day (BID) | ORAL | Status: AC
  Administered 2017-04-07 – 2017-04-08 (×3): 40 meq via ORAL
  Filled 2017-04-07 (×3): qty 2

## 2017-04-07 MED ORDER — PRO-STAT SUGAR FREE PO LIQD
30.0000 mL | Freq: Two times a day (BID) | ORAL | Status: DC
  Administered 2017-04-07 – 2017-04-11 (×8): 30 mL via ORAL
  Filled 2017-04-07 (×8): qty 30

## 2017-04-07 NOTE — Consult Note (Signed)
[facility] Nurse wound consult note Reason for Consult: Chronic nonhealing left heel ulcer.  S/P osteo excision last year.  Increased pain and drainage noted, per patient. Vancomycin given in ED. Wound type:Nonhealing neuropathic ulcer Pressure Injury POA: Yes Measurement: 5 cm x 4.2 cm unable to visualize wound bed due to presence of adherent slough.  Will begin enzymatic debridement via Santyl.  Wound bed:90% adherent slough  10% pale pink tissue.  Calloused tissue present circumferentially, extends 3 cm  Drainage (amount, consistency, odor) moderate serosanguinous  No odor Periwound:callous Dressing procedure/placement/frequency:Cleanse wound to left heel. Santyl to wound bed.  Cover with  NS moist gauze/4x4 gauze and kerlix/tape.  Change daily.   Will defer to Dr Sharol Given once he has consulted.  Will not follow at this time.  Please re-consult if needed.  Domenic Moras RN BSN [place] Pager [phone]

## 2017-04-07 NOTE — Progress Notes (Signed)
Initial Nutrition Assessment  DOCUMENTATION CODES:   Obesity unspecified  INTERVENTION:    Glucerna Shake po BID, each supplement provides 220 kcal and 10 grams of protein   Prostat liquid protein po 30 ml BID with meals, each supplement provides 100 kcal, 15 grams protein  NUTRITION DIAGNOSIS:   Increased nutrient needs related to wound healing as evidenced by estimated needs  GOAL:   Patient will meet greater than or equal to 90% of their needs  MONITOR:   PO intake, Supplement acceptance, Labs, Weight trends, Skin, I & O's  REASON FOR ASSESSMENT:   Consult Wound healing  ASSESSMENT:   61 y.o. Female with a past medical history significant for IDDM, CKD baseline Cr 1.5, NASH cirrhosis compensated, HTN, and chronic left heel ulcer s/p osteo excision last Dec who presents with fever, syncope, malaise, worsening heel pain.  RD unable to obtain nutrition hx.  Pt crying upon visit today. States she just found out she will have to have her foot amputated. NO % PO intake records available per flowsheets.  Labs reviewed.  Sodium 134 (L). Potassium 3.1 (L). NO % PO intake records available per flowsheets. CBG's [phone].  Unable to complete Nutrition-Focused physical exam at this time.   Diet Order:  Diet heart healthy/carb modified Room service appropriate? Yes; Fluid consistency: Thin  Skin:  Wound (see comment) (diabetic foot ulcer)  Last BM:  6/7  Height:   Ht Readings from Last 1 Encounters:  04/06/17 5' 2"  (1.575 m)    Weight:   Wt Readings from Last 1 Encounters:  04/06/17 190 lb 11.2 oz (86.5 kg)    Ideal Body Weight:  50 kg  BMI:  Body mass index is 34.88 kg/m.  Estimated Nutritional Needs:   Kcal:  1800-2000  Protein:  90-100 gm  Fluid:  1.8-2.0 L  EDUCATION NEEDS:   No education needs identified at this time  Arthur Holms, RD, LDN Pager #: [phone] After-Hours Pager #: [phone]

## 2017-04-07 NOTE — Plan of Care (Signed)
Problem: Skin Integrity: Goal: Risk for impaired skin integrity will decrease Outcome: Progressing Pt understand rational for wound care consult - mobility and hygiene.  Problem: Tissue Perfusion: Goal: Adequacy of tissue perfusion will improve Outcome: Progressing Patient actively  Participate in care plan and verbalizes understanding.  Comments: Patient actively Participates in care plan and verbalizes understanding.

## 2017-04-07 NOTE — Progress Notes (Signed)
[facility] TEAM 1 - Stepdown/ICU TEAM  ELIZETTE SHEK  PQD:[id number] DOB: [birth year] DOA: 04/06/2017 PCP: Ann Held, DO    Brief Narrative:  61 y.o. 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 Sharol Given) who presented with fever to 101, syncope, malaise, and worsening heel pain.  In the ED she was found to be severely orthostatic w/ severe hyperglycemia.  Subjective: The pt reports ongoing pain in her L foot.  She denies cp, sob, n/v, or abdom pain.    Assessment & Plan:  Severe hyperglycemia in uncontrolled DM 2 CBGs much improved with insulin drip - climbing again now that drip discontinued - adjust medical therapy and follow - very strict control a necessity  Left diabetic foot infection Cont broad spectrum empiric abx - Dr. Sharol Given recommends amputation, but pt is not agreeable to same yet  Hypokalemia Replace and follow trend   [name] cirrhosis  CKD stage III  HTN  Anemia of chronic disease Follow Hgb trend w/ hydration - no evidence of acute blood loss  Fibromyalgia  Obesity - Body mass index is 34.88 kg/m.   DVT prophylaxis: lovenox  Code Status: FULL CODE Family Communication: spoke w/ husband at bedside  Disposition Plan: SDU - probable transfer in AM if remains stable over night   Consultants:  Ortho - Dr. Sharol Given   Procedures: none  Antimicrobials:  Aztreonam 6/8 > Vancomycin 6/8 >  Objective: Blood pressure (!) 104/52, pulse 89, temperature 98.9 F (37.2 C), temperature source Oral, resp. rate 15, height 5' 2"  (1.575 m), weight 86.5 kg (190 lb 11.2 oz), SpO2 100 %.  Intake/Output Summary (Last 24 hours) at 04/07/17 0951 Last data filed at 04/07/17 0600  Gross per 24 hour  Intake          2347.53 ml  Output             1025 ml  Net          1322.53 ml   Filed Weights   04/06/17 1254 04/06/17 1856  Weight: 82.6 kg (182 lb) 86.5 kg (190 lb 11.2 oz)      Examination: General: No acute respiratory distress Lungs: Clear to auscultation bilaterally without wheezes or crackles Cardiovascular: Regular rate and rhythm without murmur gallop or rub normal S1 and S2 Abdomen: Nontender, nondistended, soft, bowel sounds positive, no rebound, no ascites, no appreciable mass Extremities: No significant cyanosis, clubbing, or edema bilateral lower extremities  CBC:  Recent Labs Lab 04/06/17 1338 04/07/17 0230  WBC 5.1 6.9  NEUTROABS 3.5  --   HGB 10.9* 9.5*  HCT 32.3* 29.8*  MCV 79.4 80.1  PLT 152 940*   Basic Metabolic Panel:  Recent Labs Lab 04/06/17 1338 04/07/17 0230  NA 124* 134*  K 4.1 3.1*  CL 83* 101  CO2 30 26  GLUCOSE 647* 159*  BUN 33* 18  CREATININE 1.90* 1.42*  CALCIUM 9.0 8.0*   GFR: Estimated Creatinine Clearance: 42.5 mL/min (A) (by C-G formula based on SCr of 1.42 mg/dL (H)).  Liver Function Tests:  Recent Labs Lab 04/07/17 0230  AST 18  ALT 15  ALKPHOS 92  BILITOT 0.7  PROT 6.1*  ALBUMIN 2.4*    HbA1C: Hemoglobin A1C  Date/Time Value Ref Range Status  04/28/2016 03:34 PM 11.0  Final    Comment:    Larene Beach CMA received at the time.    Hgb  A1c MFr Bld  Date/Time Value Ref Range Status  01/03/2016 02:58 PM 10.8 (H) 4.6 - 6.5 % Final    Comment:    Glycemic Control Guidelines for People with Diabetes:Non Diabetic:  <6%Goal of Therapy: <7%Additional Action Suggested:  >8%   08/12/2015 12:21 PM 12.4 (H) 4.6 - 6.5 % Final    Comment:    Glycemic Control Guidelines for People with Diabetes:Non Diabetic:  <6%Goal of Therapy: <7%Additional Action Suggested:  >8%     CBG:  Recent Labs Lab 04/07/17 0404 04/07/17 0548 04/07/17 0657 04/07/17 0742 04/07/17 0917  GLUCAP 130* 179* 167* 200* 224*    Recent Results (from the past 240 hour(s))  Blood Cultures x 2 sites     Status: None (Preliminary result)   Collection Time: 04/06/17  2:55 PM  Result Value Ref Range Status   Specimen  Description   Final    BLOOD RIGHT FOREARM Performed at [facility], [place] [address] [place], [place] [address]    Special Requests   Final    BOTTLES DRAWN AEROBIC AND ANAEROBIC Blood Culture adequate volume   Culture PENDING  Incomplete   Report Status PENDING  Incomplete  MRSA PCR Screening     Status: None   Collection Time: 04/06/17  7:04 PM  Result Value Ref Range Status   MRSA by PCR NEGATIVE NEGATIVE Final    Comment:        The GeneXpert MRSA Assay (FDA approved for NASAL specimens only), is one component of a comprehensive MRSA colonization surveillance program. It is not intended to diagnose MRSA infection nor to guide or monitor treatment for MRSA infections.      Scheduled Meds: . atorvastatin  10 mg Oral q1800  . DULoxetine  60 mg Oral Daily  . enoxaparin (LOVENOX) injection  40 mg Subcutaneous Q24H  . fenofibrate  160 mg Oral Daily  . ferrous sulfate  325 mg Oral Q breakfast  . insulin regular  0-10 Units Intravenous TID WC  . polyethylene glycol  17 g Oral Daily      LOS: 1 day   Cherene Altes, MD [facility] Office  [phone] Pager - Text Page per Shea Evans as per below:  On-Call/Text Page:      [email or web address]      password [email or web address]  If 7PM-7AM, please contact night-coverage [email or web address] Password [email or web address] 04/07/2017, 9:51 AM
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
## 2017-04-08 ENCOUNTER — Inpatient Hospital Stay (HOSPITAL_COMMUNITY): Payer: BLUE CROSS/BLUE SHIELD

## 2017-04-08 DIAGNOSIS — E11628 Type 2 diabetes mellitus with other skin complications: Secondary | ICD-10-CM

## 2017-04-08 LAB — GLUCOSE, CAPILLARY
GLUCOSE-CAPILLARY: 285 mg/dL — AB (ref 65–99)
GLUCOSE-CAPILLARY: 261 mg/dL — AB (ref 65–99)
Glucose-Capillary: 225 mg/dL — ABNORMAL HIGH (ref 65–99)
Glucose-Capillary: 195 mg/dL — ABNORMAL HIGH (ref 65–99)

## 2017-04-08 LAB — CBC
HCT: 30.2 % — ABNORMAL LOW (ref 36.0–46.0)
Hemoglobin: 9.6 g/dL — ABNORMAL LOW (ref 12.0–15.0)
MCH: 26.3 pg (ref 26.0–34.0)
MCHC: 31.8 g/dL (ref 30.0–36.0)
MCV: 82.7 fL (ref 78.0–100.0)
PLATELETS: 139 10*3/uL — AB (ref 150–400)
RBC: 3.65 MIL/uL — ABNORMAL LOW (ref 3.87–5.11)
RDW: 15.6 % — AB (ref 11.5–15.5)
WBC: 5.4 10*3/uL (ref 4.0–10.5)

## 2017-04-08 LAB — COMPREHENSIVE METABOLIC PANEL
ALBUMIN: 2.3 g/dL — AB (ref 3.5–5.0)
ALT: 15 U/L (ref 14–54)
AST: 18 U/L (ref 15–41)
Alkaline Phosphatase: 90 U/L (ref 38–126)
Anion gap: 6 (ref 5–15)
BUN: 16 mg/dL (ref 6–20)
CHLORIDE: 100 mmol/L — AB (ref 101–111)
CO2: 28 mmol/L (ref 22–32)
Calcium: 8.5 mg/dL — ABNORMAL LOW (ref 8.9–10.3)
Creatinine, Ser: 1.54 mg/dL — ABNORMAL HIGH (ref 0.44–1.00)
GFR calc Af Amer: 41 mL/min — ABNORMAL LOW (ref >60)
GFR, EST NON AFRICAN AMERICAN: 35 mL/min — AB (ref >60)
GLUCOSE: 295 mg/dL — AB (ref 65–99)
Potassium: 3.6 mmol/L (ref 3.5–5.1)
Sodium: 134 mmol/L — ABNORMAL LOW (ref 135–145)
Total Bilirubin: 0.7 mg/dL (ref 0.3–1.2)
Total Protein: 6 g/dL — ABNORMAL LOW (ref 6.5–8.1)

## 2017-04-08 LAB — HEMOGLOBIN A1C
Hgb A1c MFr Bld: 15.3 % — ABNORMAL HIGH (ref 4.8–5.6)
MEAN PLASMA GLUCOSE: 392 mg/dL

## 2017-04-08 MED ORDER — VANCOMYCIN HCL 10 G IV SOLR
1500.0000 mg | Freq: Once | INTRAVENOUS | Status: AC
  Administered 2017-04-08: 1500 mg via INTRAVENOUS
  Filled 2017-04-08: qty 1500

## 2017-04-08 MED ORDER — INSULIN GLARGINE 100 UNIT/ML ~~LOC~~ SOLN
14.0000 [IU] | Freq: Every day | SUBCUTANEOUS | Status: DC
  Administered 2017-04-08 – 2017-04-09 (×2): 14 [IU] via SUBCUTANEOUS
  Filled 2017-04-08 (×2): qty 0.14

## 2017-04-08 MED ORDER — VANCOMYCIN HCL 10 G IV SOLR
1250.0000 mg | INTRAVENOUS | Status: AC
  Administered 2017-04-09: 1250 mg via INTRAVENOUS
  Filled 2017-04-08: qty 1250

## 2017-04-08 MED ORDER — LEVOFLOXACIN IN D5W 750 MG/150ML IV SOLN
750.0000 mg | INTRAVENOUS | Status: DC
  Administered 2017-04-08 – 2017-04-10 (×2): 750 mg via INTRAVENOUS
  Filled 2017-04-08 (×2): qty 150

## 2017-04-08 MED ORDER — INSULIN ASPART 100 UNIT/ML ~~LOC~~ SOLN: 12.0000 [IU] | Freq: Three times a day (TID) | SUBCUTANEOUS | Status: DC

## 2017-04-08 MED ORDER — INSULIN ASPART 100 UNIT/ML ~~LOC~~ SOLN
10.0000 [IU] | Freq: Three times a day (TID) | SUBCUTANEOUS | Status: DC
  Administered 2017-04-08 – 2017-04-09 (×3): 10 [IU] via SUBCUTANEOUS

## 2017-04-08 MED ORDER — LEVOFLOXACIN IN D5W 750 MG/150ML IV SOLN: 750.0000 mg | INTRAVENOUS | Status: DC

## 2017-04-08 NOTE — Progress Notes (Signed)
Patient being transferred to 5N13. Report called to Joellen Jersey, Therapist, sports. Family at bedside and is aware of patient transferring. All belongings gathered and taken with patient. All questions answered. Care deferred to 5N.

## 2017-04-08 NOTE — Progress Notes (Signed)
Pharmacy Antibiotic Note  [name] [name] is a 61 y.o. female admitted on 04/06/2017 with foot wound. Antibiotics were held starting 6/8 for MRI and formation of plan. However, patient is currently not agreeable to amputation, so antibiotics have been restarted. Pharmacy has been consulted for vancomycin dosing. She is also on levofloxacin that has been adjusted for renal function.   Patient remains afebrile with WBC wnl. LA slightly elevated at 1.83 improved to 1.1. SCr improved to 1.54 (near baseline) with estimated CrCl 35-40 mL/min. Of note, patient has anaphylaxis to penicillin.   Plan: Levofloxacin 750 mg IV q24hr Vancomycin 1500 mg IV once, then 1250 mg IV q24hr Vancomycin goal trough 15-20 mcg/mL Vancomycin trough at [facility] and as needed Monitor renal function, clinical picture, and culture data F/u length of therapy, de-escalation, and plans for amputation    Height: 5' 2"  (157.5 cm) Weight: 190 lb 11.2 oz (86.5 kg) IBW/kg (Calculated) : 50.1  Temp (24hrs), Avg:97.7 F (36.5 C), Min:97.4 F (36.3 C), Max:98.3 F (36.8 C)   Recent Labs Lab 04/06/17 1338 04/06/17 1508 04/06/17 2037 04/07/17 0230 04/07/17 0751 04/08/17 0129  WBC 5.1  --   --  6.9  --  5.4  CREATININE 1.90*  --   --  1.42*  --  1.54*  LATICACIDVEN  --  1.83 1.7 0.8 1.1  --     Estimated Creatinine Clearance: 39.2 mL/min (A) (by C-G formula based on SCr of 1.54 mg/dL (H)).    Allergies  Allergen Reactions  . Fish-Derived Products Hives, Shortness Of Breath, Swelling and Rash    Hives get in throat causing trouble breathing  . Mushroom Extract Complex Anaphylaxis  . Penicillins Anaphylaxis and Swelling  . Rosemary Oil Anaphylaxis  . Shellfish Allergy Hives, Shortness Of Breath, Swelling and Rash  . Tomato Hives and Shortness Of Breath    Hives in throat causes her trouble breathing  . Acetaminophen     Gi upset  . Acyclovir And Related   . Aloe Vera     hives  . Other     Antimicrobials this  admission: 6/8 Aztreonam >> 6/8 6/8 Vanc >> 6/8, 6/10 >> 6/10 Levaquin >>   Dose adjustments this admission: n/a    Microbiology results: 6/8 MRSA PCR: negative 6/8 Blood cx: no growth to date 6/9 HIV screen: negative  Belia Heman, PharmD PGY1 Pharmacy Resident [phone] (Pager) 04/08/2017 9:38 AM

## 2017-04-08 NOTE — Progress Notes (Signed)
VASCULAR LAB PRELIMINARY  ARTERIAL  ABI completed: ABIs indicate adequate arterial flow to the bilateral lower extremities.     RIGHT    LEFT    PRESSURE WAVEFORM  PRESSURE WAVEFORM  BRACHIAL 124 T BRACHIAL restricted T  DP   DP    AT 132 B AT 129 B  PT 139 B PT 137 B  PER   PER    GREAT TOE  NA GREAT TOE  NA    RIGHT LEFT  ABI 1.1 1.1     Babara Buffalo, RVT 04/08/2017, 2:10 PM

## 2017-04-08 NOTE — Progress Notes (Signed)
[facility] TEAM 1 - Stepdown/ICU TEAM  [name] [name]  CNO:[id number] DOB: 06-Jun-1956 DOA: 04/06/2017 PCP: Ann Held, DO    Brief Narrative:  61 y.o. 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 Sharol Given) who presented with fever to 101, syncope, malaise, and worsening heel pain.  In the ED she was found to be severely orthostatic w/ severe hyperglycemia.  Subjective:  The patient is sitting up at the bedside eating breakfast.  She denies chest pain fevers chills nausea vomiting or abdominal pain.  She is in good spirits today.  Assessment & Plan:  Severe hyperglycemia in uncontrolled DM 2 CBG remains difficult to control - continue to titrate insulin therapy - the patient admits that she's had extreme difficulty getting her sugar any better than 200 in the outpatient setting  Left diabetic foot infection Cont broad spectrum empiric abx - Dr. Sharol Given recommends amputation, but pt is not agreeable to same yet - he is to revisit Monday  Hypokalemia Continue to supplement with goal of 4.0  Nash cirrhosis Albumin 2.3 - LFTs normal - ammonia/mental status normal - check coags in AM  CKD stage III Creatinine is stable at approximately 1.5  HTN Blood pressure is variable - follow without change in treatment plan today  Anemia of chronic disease Hemoglobin is stable at this time  Fibromyalgia Appears well compensated at present  Obesity - Body mass index is 34.88 kg/m.   DVT prophylaxis: lovenox  Code Status: FULL CODE Family Communication: spoke w/ husband at bedside  Disposition Plan: stable for transfer to ortho floor bed   Consultants:  Ortho - Dr. Sharol Given   Procedures: none  Antimicrobials:  Aztreonam 6/8  Vancomycin 6/8 > Levaquin 6/10 >  Objective: Blood pressure (!) 100/48, pulse 92, temperature 97.8 F (36.6 C), temperature source Axillary, resp. rate 18, height  5' 2"  (1.575 m), weight 86.5 kg (190 lb 11.2 oz), SpO2 95 %.  Intake/Output Summary (Last 24 hours) at 04/08/17 0853 Last data filed at 04/08/17 0600  Gross per 24 hour  Intake          1258.33 ml  Output             3500 ml  Net         -2241.67 ml   Filed Weights   04/06/17 1254 04/06/17 1856  Weight: 82.6 kg (182 lb) 86.5 kg (190 lb 11.2 oz)    Examination: General: No acute respiratory distress - alert and conversant  Lungs: CTA B w/o wheezing  Cardiovascular: RRR w/o gallup or rub  Abdomen: NT/ND, soft, bs+, no mass, no rebound  Extremities: No significant edema bilateral lower extremities  CBC:  Recent Labs Lab 04/06/17 1338 04/07/17 0230 04/08/17 0129  WBC 5.1 6.9 5.4  NEUTROABS 3.5  --   --   HGB 10.9* 9.5* 9.6*  HCT 32.3* 29.8* 30.2*  MCV 79.4 80.1 82.7  PLT 152 140* 294*   Basic Metabolic Panel:  Recent Labs Lab 04/06/17 1338 04/07/17 0230 04/08/17 0129  NA 124* 134* 134*  K 4.1 3.1* 3.6  CL 83* 101 100*  CO2 30 26 28   GLUCOSE 647* 159* 295*  BUN 33* 18 16  CREATININE 1.90* 1.42* 1.54*  CALCIUM 9.0 8.0* 8.5*   GFR: Estimated Creatinine Clearance: 39.2 mL/min (A) (by C-G formula based on SCr of 1.54 mg/dL (H)).  Liver Function Tests:  Recent Labs Lab 04/07/17 0230 04/08/17 0129  AST 18 18  ALT 15 15  ALKPHOS 92 90  BILITOT 0.7 0.7  PROT 6.1* 6.0*  ALBUMIN 2.4* 2.3*    HbA1C: Hemoglobin A1C  Date/Time Value Ref Range Status  04/28/2016 03:34 PM 11.0  Final    Comment:    Larene Beach CMA received at the time.    Hgb A1c MFr Bld  Date/Time Value Ref Range Status  01/03/2016 02:58 PM 10.8 (H) 4.6 - 6.5 % Final    Comment:    Glycemic Control Guidelines for People with Diabetes:Non Diabetic:  <6%Goal of Therapy: <7%Additional Action Suggested:  >8%   08/12/2015 12:21 PM 12.4 (H) 4.6 - 6.5 % Final    Comment:    Glycemic Control Guidelines for People with Diabetes:Non Diabetic:  <6%Goal of Therapy: <7%Additional Action Suggested:  >8%       CBG:  Recent Labs Lab 04/07/17 1050 04/07/17 1212 04/07/17 1518 04/07/17 1849 04/07/17 2137  GLUCAP 311* 302* 305* 339* 391*    Recent Results (from the past 240 hour(s))  Blood Cultures x 2 sites     Status: None (Preliminary result)   Collection Time: 04/06/17  2:45 PM  Result Value Ref Range Status   Specimen Description BLOOD RIGHT ANTECUBITAL  Final   Special Requests   Final    BOTTLES DRAWN AEROBIC AND ANAEROBIC Blood Culture adequate volume   Culture   Final    NO GROWTH < 24 HOURS Performed at [facility], [place] [address] [place], [place] [address]    Report Status PENDING  Incomplete  Blood Cultures x 2 sites     Status: None (Preliminary result)   Collection Time: 04/06/17  2:55 PM  Result Value Ref Range Status   Specimen Description BLOOD RIGHT FOREARM  Final   Special Requests   Final    BOTTLES DRAWN AEROBIC AND ANAEROBIC Blood Culture adequate volume   Culture   Final    NO GROWTH < 24 HOURS Performed at [facility], [place] [address] [place], [place] [address]    Report Status PENDING  Incomplete  MRSA PCR Screening     Status: None   Collection Time: 04/06/17  7:04 PM  Result Value Ref Range Status   MRSA by PCR NEGATIVE NEGATIVE Final    Comment:        The GeneXpert MRSA Assay (FDA approved for NASAL specimens only), is one component of a comprehensive MRSA colonization surveillance program. It is not intended to diagnose MRSA infection nor to guide or monitor treatment for MRSA infections.      Scheduled Meds: . atorvastatin  10 mg Oral q1800  . collagenase   Topical Daily  . DULoxetine  60 mg Oral Daily  . enoxaparin (LOVENOX) injection  40 mg Subcutaneous Q24H  . feeding supplement (GLUCERNA SHAKE)  237 mL Oral BID BM  . feeding supplement (PRO-STAT SUGAR FREE 64)  30 mL Oral BID  . fenofibrate  160 mg Oral Daily  . ferrous sulfate  325 mg Oral Q breakfast  . insulin aspart  0-20 Units Subcutaneous TID WC  .  insulin aspart  0-5 Units Subcutaneous QHS  . insulin aspart  8 Units Subcutaneous TID WC  . insulin glargine  40 Units Subcutaneous QHS  . polyethylene glycol  17 g Oral Daily  . potassium chloride  40 mEq Oral BID      LOS: 2 days   Cherene Altes, MD  [facility] Office  [phone] Pager - Text Page per Shea Evans as per below:  On-Call/Text Page:      [email or web address]      password [email or web address]  If 7PM-7AM, please contact night-coverage [email or web address] Password [email or web address] 04/08/2017, 8:53 AM

## 2017-04-09 LAB — BASIC METABOLIC PANEL
Anion gap: 4 — ABNORMAL LOW (ref 5–15)
BUN: 21 mg/dL — AB (ref 6–20)
CHLORIDE: 104 mmol/L (ref 101–111)
CO2: 27 mmol/L (ref 22–32)
Calcium: 8.9 mg/dL (ref 8.9–10.3)
Creatinine, Ser: 1.54 mg/dL — ABNORMAL HIGH (ref 0.44–1.00)
GFR calc Af Amer: 41 mL/min — ABNORMAL LOW (ref >60)
GFR calc non Af Amer: 35 mL/min — ABNORMAL LOW (ref >60)
Glucose, Bld: 283 mg/dL — ABNORMAL HIGH (ref 65–99)
POTASSIUM: 4.6 mmol/L (ref 3.5–5.1)
SODIUM: 135 mmol/L (ref 135–145)

## 2017-04-09 LAB — PROTIME-INR
INR: 1.16
Prothrombin Time: 14.8 seconds (ref 11.4–15.2)

## 2017-04-09 LAB — CBC
HCT: 32.7 % — ABNORMAL LOW (ref 36.0–46.0)
HEMOGLOBIN: 10.1 g/dL — AB (ref 12.0–15.0)
MCH: 25.8 pg — ABNORMAL LOW (ref 26.0–34.0)
MCHC: 30.9 g/dL (ref 30.0–36.0)
MCV: 83.4 fL (ref 78.0–100.0)
Platelets: 151 10*3/uL (ref 150–400)
RBC: 3.92 MIL/uL (ref 3.87–5.11)
RDW: 15.5 % (ref 11.5–15.5)
WBC: 5 10*3/uL (ref 4.0–10.5)

## 2017-04-09 LAB — GLUCOSE, CAPILLARY
GLUCOSE-CAPILLARY: 230 mg/dL — AB (ref 65–99)
GLUCOSE-CAPILLARY: 205 mg/dL — AB (ref 65–99)
Glucose-Capillary: 207 mg/dL — ABNORMAL HIGH (ref 65–99)
Glucose-Capillary: 158 mg/dL — ABNORMAL HIGH (ref 65–99)

## 2017-04-09 LAB — APTT: aPTT: 26 seconds (ref 24–36)

## 2017-04-09 MED ORDER — INSULIN ASPART 100 UNIT/ML ~~LOC~~ SOLN
12.0000 [IU] | Freq: Three times a day (TID) | SUBCUTANEOUS | Status: DC
  Administered 2017-04-09 – 2017-04-11 (×7): 12 [IU] via SUBCUTANEOUS

## 2017-04-09 MED ORDER — INSULIN GLARGINE 100 UNIT/ML ~~LOC~~ SOLN
18.0000 [IU] | Freq: Every day | SUBCUTANEOUS | Status: DC
  Administered 2017-04-10 – 2017-04-11 (×2): 18 [IU] via SUBCUTANEOUS
  Filled 2017-04-09 (×2): qty 0.18

## 2017-04-09 MED ORDER — INSULIN GLARGINE 100 UNIT/ML ~~LOC~~ SOLN
4.0000 [IU] | Freq: Once | SUBCUTANEOUS | Status: DC
Start: 2017-04-09 — End: 2017-04-09

## 2017-04-09 NOTE — Evaluation (Signed)
Occupational Therapy Evaluation Patient Details Name: [name] [name] MRN: [id number] DOB: 1956/07/07 Today's Date: 04/09/2017    History of Present Illness Pt is a 61 yo female admitted with L foot pain accompanied by fever and syncope, dx with diabetic foot infection related to chronic L foot ulcer. PMH for uncontrolled DM, HTN, CKD, Nash cirrohsis and fibromyalgia,     Clinical Impression   PTA, pt was living with her husband and was independent. Currently, pt requires Min A for ADLs in standing and functional mobility using RW. Provided education on LB ADLs and toilet transfer; pt demonstrated understanding. Pt would benefit from acute OT to increase pt safety and independence with ADLs and functional mobility. DC recommendation pending pt progress and possible amputation.     Follow Up Recommendations  Supervision/Assistance - 24 hour;Other (comment) (Pending pt progress and if she has amputation)    Equipment Recommendations  None recommended by OT    Recommendations for Other Services PT consult     Precautions / Restrictions Precautions Precautions: Fall Restrictions Weight Bearing Restrictions: Yes LLE Weight Bearing: Non weight bearing      Mobility Bed Mobility               General bed mobility comments: in recliner at entry  Transfers Overall transfer level: Needs assistance Equipment used: Rolling walker (2 wheeled) Transfers: Sit to/from Stand Sit to Stand: Min assist         General transfer comment: minA for steadying in upright vc for push off from recliner arms instead of RW    Balance Overall balance assessment: Needs assistance Sitting-balance support: No upper extremity supported;Feet supported Sitting balance-Leahy Scale: Good Sitting balance - Comments: Able to adjust sock without LOB   Standing balance support: Bilateral upper extremity supported Standing balance-Leahy Scale: Poor Standing balance comment: required minA for steadying  on standing                           ADL either performed or assessed with clinical judgement   ADL Overall ADL's : Needs assistance/impaired Eating/Feeding: Set up;Sitting   Grooming: Set up;Sitting   Upper Body Bathing: Set up;Sitting   Lower Body Bathing: Minimal assistance;Sit to/from stand   Upper Body Dressing : Set up;Sitting   Lower Body Dressing: Minimal assistance;Sit to/from stand Lower Body Dressing Details (indicate cue type and reason): Pt with good ROM to adjust R sock while seated. Pt donned underwear prior to OT arrival. Discussed safe technique. To requires Min A for standing balance Toilet Transfer: Ambulation;Minimal assistance;BSC;RW Toilet Transfer Details (indicate cue type and reason): Pt with Min A for standing balance Toileting- Clothing Manipulation and Hygiene: Minimal assistance;Sit to/from stand Toileting - Clothing Manipulation Details (indicate cue type and reason): Min A standing balance     Functional mobility during ADLs: Minimal assistance;Rolling walker General ADL Comments: Pt demosntrating decreased fucntional performance with need for Min A for ADLs in standing and for functional mobility. 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 LLE Deficits / Details: L foot and ankle pain limiting ROM and strength LLE: Unable to fully assess due to  pain LLE Sensation: history of peripheral neuropathy   Cervical / Trunk Assessment Cervical / Trunk Assessment: Normal   Communication Communication Communication: No difficulties   Cognition Arousal/Alertness: Awake/alert Behavior During Therapy: WFL for tasks assessed/performed Overall Cognitive Status: Within Functional Limits for tasks assessed                                     General Comments       Exercises     Shoulder Instructions      Home Living Family/patient expects to be discharged to:: Private residence Living Arrangements: Spouse/significant other Available Help at Discharge: Family;Available PRN/intermittently Type of Home: House Home Access: Stairs to enter;Ramped entrance Entrance Stairs-Number of Steps: 2 Entrance Stairs-Rails: None Home Layout: Two level;Bed/bath upstairs Alternate Level Stairs-Number of Steps: 18 Alternate Level Stairs-Rails: Right Bathroom Shower/Tub: Occupational psychologist: Standard Bathroom Accessibility: Yes   Home Equipment: Environmental consultant - 2 wheels;Shower seat;Bedside commode;Grab bars - tub/shower;Hand held shower head;Electric scooter (knee walker, )          Prior Functioning/Environment Level of Independence: Independent        Comments: ADLs, IADLs, and driving        OT Problem List: Decreased strength;Decreased activity tolerance;Impaired balance (sitting and/or standing);Decreased safety awareness;Decreased knowledge of use of DME or AE;Decreased knowledge of precautions;Pain      OT Treatment/Interventions: Self-care/ADL training;Therapeutic exercise;Energy conservation;DME and/or AE instruction;Therapeutic activities;Patient/family education    OT Goals(Current goals can be found in the care plan section) Acute Rehab OT Goals Patient Stated Goal: go home as soon as possible OT Goal Formulation: With patient Time For Goal Achievement: 04/23/17 Potential to Achieve Goals: Good ADL  Goals Pt Will Perform Grooming: with set-up;with supervision;standing Pt Will Perform Lower Body Dressing: with set-up;with supervision;sit to/from stand Pt Will Transfer to Toilet: with set-up;with supervision;bedside commode;ambulating Pt Will Perform Toileting - Clothing Manipulation and hygiene: with set-up;with supervision;sit to/from stand Pt Will Perform Tub/Shower Transfer: Shower transfer;with min guard assist;shower seat;ambulating;rolling walker  OT Frequency: Min 2X/week   Barriers to D/C:            Co-evaluation              AM-PAC PT "6 Clicks" Daily Activity     Outcome Measure Help from another person eating meals?: None Help from another person taking care of personal grooming?: A Little Help from another person toileting, which includes using toliet, bedpan, or urinal?: A Little Help from another person bathing (including washing, rinsing, drying)?: A Little Help from another person to put on and taking off regular upper body clothing?: A Little Help from another person to put on and taking off regular lower body clothing?: None 6 Click Score: 20   End of Session Equipment Utilized During Treatment: Rolling walker;Gait belt Nurse Communication: Mobility status;Weight bearing status;Precautions  Activity Tolerance: Patient tolerated treatment well Patient left: in chair;with call bell/phone within reach  OT Visit Diagnosis: Unsteadiness on feet (R26.81);Other abnormalities of gait and mobility (R26.89);Muscle weakness (generalized) (M62.81);Pain Pain - Right/Left: Left Pain - part of body: Ankle and joints of foot                Time: 0086-7619 OT  Time Calculation (min): 16 min Charges:  OT General Charges $OT Visit: 1 Procedure OT Evaluation $OT Eval Low Complexity: 1 Procedure G-Codes:     Treyvin Glidden MSOT, OTR/L Acute Rehab Pager: [phone] Office: [place] 04/09/2017, 5:13 PM

## 2017-04-09 NOTE — Evaluation (Signed)
Physical Therapy Evaluation Patient Details Name: [name] [name] MRN: [id number] DOB: 11/27/1955 Today's Date: 04/09/2017   History of Present Illness  Pt is a 61 yo female admitted with L foot pain accompanied by fever and syncope, dx with diabetic foot infection related to chronic L foot ulcer. PMH for uncontrolled DM, HTN, CKD, Nash cirrohsis and fibromyalgia,    Clinical Impression  Pt admitted with above diagnosis. Pt currently with functional limitations due to the deficits listed below (see PT Problem List). Pt currently, minA for transfers and ambulation of 8 feet with RW maintaining L LE NWB. Pt will benefit from skilled PT to increase their independence and safety with mobility to allow discharge to the venue listed below.       Follow Up Recommendations Home health PT    Equipment Recommendations  None recommended by PT    Recommendations for Other Services       Precautions / Restrictions Precautions Precautions: Fall Restrictions Weight Bearing Restrictions: Yes LLE Weight Bearing: Non weight bearing      Mobility  Bed Mobility               General bed mobility comments: in recliner at entry  Transfers Overall transfer level: Needs assistance Equipment used: Rolling walker (2 wheeled) Transfers: Sit to/from Stand Sit to Stand: Min assist         General transfer comment: minA for steadying in upright vc for push off from recliner arms instead of RW  Ambulation/Gait Ambulation/Gait assistance: Min assist Ambulation Distance (Feet): 8 Feet Assistive device: Rolling walker (2 wheeled) Gait Pattern/deviations:  (hop to pattern) Gait velocity: slowed Gait velocity interpretation: Below normal speed for age/gender General Gait Details: minA for steadying, pt reports that hopping in walker increases her R hip pain and that she normally uses her scooter or knee walker to get around her house        Balance Overall balance assessment: Needs  assistance         Standing balance support: Bilateral upper extremity supported Standing balance-Leahy Scale: Poor Standing balance comment: required minA for steadying on standing                             Pertinent Vitals/Pain Pain Assessment: 0-10 Pain Score: 7  Pain Location: L foot Pain Descriptors / Indicators: Pins and needles;Throbbing;Tingling;Constant Pain Intervention(s): Monitored during session;Limited activity within patient's tolerance  VSS    Home Living Family/patient expects to be discharged to:: Private residence Living Arrangements: Spouse/significant other Available Help at Discharge: Family;Available PRN/intermittently Type of Home: House Home Access: Stairs to enter;Ramped entrance Entrance Stairs-Rails: None Entrance Stairs-Number of Steps: 2 Home Layout: Two level;Bed/bath upstairs Home Equipment: Walker - 2 wheels;Shower seat;Bedside commode;Grab bars - tub/shower;Hand held shower head;Electric scooter (knee walker, )      Prior Function Level of Independence: Independent         Comments: was not using walker and pt states that she probably should have been     Hand Dominance   Dominant Hand: Left    Extremity/Trunk Assessment   Upper Extremity Assessment Upper Extremity Assessment: Overall WFL for tasks assessed    Lower Extremity Assessment Lower Extremity Assessment: LLE deficits/detail LLE Deficits / Details: L foot and ankle pain limiting ROM and strength LLE: Unable to fully assess due to pain LLE Sensation: history of peripheral neuropathy    Cervical / Trunk Assessment Cervical / Trunk Assessment: Normal  Communication  Communication: No difficulties  Cognition Arousal/Alertness: Awake/alert Behavior During Therapy: WFL for tasks assessed/performed Overall Cognitive Status: Within Functional Limits for tasks assessed                                               Assessment/Plan     PT Assessment Patient needs continued PT services  PT Problem List Decreased strength;Decreased range of motion;Decreased activity tolerance;Decreased balance;Decreased mobility;Decreased safety awareness;Impaired sensation;Pain       PT Treatment Interventions DME instruction;Gait training;Functional mobility training;Therapeutic activities;Therapeutic exercise;Balance training;Patient/family education    PT Goals (Current goals can be found in the Care Plan section)  Acute Rehab PT Goals Patient Stated Goal: go home as soon as possible PT Goal Formulation: With patient Time For Goal Achievement: 04/23/17 Potential to Achieve Goals: Good    Frequency Min 3X/week   Barriers to discharge Decreased caregiver support pt looking into get home health aide       AM-PAC PT "6 Clicks" Daily Activity  Outcome Measure Difficulty turning over in bed (including adjusting bedclothes, sheets and blankets)?: A Little Difficulty moving from lying on back to sitting on the side of the bed? : A Little Difficulty sitting down on and standing up from a chair with arms (e.g., wheelchair, bedside commode, etc,.)?: A Lot Help needed moving to and from a bed to chair (including a wheelchair)?: A Little Help needed walking in hospital room?: A Little Help needed climbing 3-5 steps with a railing? : Total 6 Click Score: 15    End of Session Equipment Utilized During Treatment: Gait belt Activity Tolerance: Patient limited by pain Patient left: in chair;with call bell/phone within reach Nurse Communication: Mobility status;Weight bearing status;Precautions PT Visit Diagnosis: Unsteadiness on feet (R26.81);Other abnormalities of gait and mobility (R26.89);Muscle weakness (generalized) (M62.81);Difficulty in walking, not elsewhere classified (R26.2);Pain;Other symptoms and signs involving the nervous system (R29.898) Pain - Right/Left: Left Pain - part of body: Ankle and joints of foot    Time:  9201-0071 PT Time Calculation (min) (ACUTE ONLY): 23 min   Charges:   PT Evaluation $PT Eval Low Complexity: 1 Procedure PT Treatments $Gait Training: 8-22 mins   PT G Codes:        Shaquile Lutze B. Migdalia Dk PT, DPT Acute Rehabilitation  [phone] Pager [phone]    [place] 04/09/2017, 1:39 PM

## 2017-04-09 NOTE — Progress Notes (Signed)
[name], female   DOB: 11/18/1955, 61 y.o.   MRN: [id number] Patient feels like her foot is looking much better. She is still strongly opposed to surgical intervention. Recommend continue antibiotics discharge on oral antibiotics and continue Santyl dressing changes. I will follow-up in the office next week.

## 2017-04-09 NOTE — Progress Notes (Signed)
Spoke with patient about her diabetes.  Patient was diagnosed about 12 years ago.  Has been on Lantus insulin. Does have PCP. Last seen 2 months ago.  Concerned about her foot issues. Patient does take insulin every day and is able to get it financially. Checks blood sugars everyday.   Will continue to follow blood sugars while in the hospital.  Harvel Ricks RN BSN CDE Diabetes Coordinator Pager: [phone]  8am-5pm

## 2017-04-09 NOTE — Progress Notes (Signed)
Responded to consult and provided emotional/spiritual support, ministry of presence, and prayer to pt wishing to avoid amputation of her foot. Chaplain available for f/u.   04/09/17 1600  Clinical Encounter Type  Visited With Patient  Visit Type Initial;Psychological support;Spiritual support;Social support;Pre-op  Referral From Nurse  Spiritual Encounters  Spiritual Needs Prayer;Emotional  Stress Factors  Patient Stress Factors Health changes;Loss of control   Gerrit Heck, Chaplain

## 2017-04-09 NOTE — Progress Notes (Signed)
[place] TEAM 1 - Stepdown/ICU TEAM  [name] [name]  GYK:[id number] DOB: 07-Nov-1955 DOA: 04/06/2017 PCP: Ann Held, DO    Brief Narrative:  61 y.o. 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 Sharol Given) who presented with fever to 101, syncope, malaise, and worsening heel pain.  In the ED she was found to be severely orthostatic w/ severe hyperglycemia.  Subjective: CBGs improving but remain elevated above goal.  Very strict control is needed in setting of limb threatening diabetic foot infection.  The pt has no new complaints today.  She denies cp, n/v, abdom pain, or sob.  I have spoken to her about the risk of recurrent sepsis and even death if her foot infection/osteo progresses again, and she voiced understanding.    Assessment & Plan:  Severe hyperglycemia in uncontrolled DM 2 CBG remains difficult to control - continue to titrate insulin therapy - the patient admits that she's had extreme difficulty getting her sugar any better than 200 in the outpatient setting - I counseled her on the absolute need for strict control to give her foot the best chance of improving - insulin to be adjusted again today - follow   Left diabetic foot infection Cont broad spectrum empiric abx - Dr. Sharol Given has again recommended amputation, and has counseled her on the risk of life threatening sepsis - she persists in her decision to refuse amputation - pt to f/u w/ Dr. Sharol Given in the office - stop Vanc in setting of negative MRSA screen - cont levaquin indefinitely   Hypokalemia Corrected w/ supplementation   [name] cirrhosis Albumin 2.3 - LFTs normal - ammonia/mental status normal - INR normal   CKD stage III Creatinine is stable at approximately 1.5  HTN Blood pressure well controlled at this time   Anemia of chronic disease Hgb stable   Fibromyalgia Appears well compensated at present  Obesity -  Body mass index is 34.88 kg/m.   DVT prophylaxis: lovenox  Code Status: FULL CODE Family Communication: no family present at time of exam today  Disposition Plan: d/c home when CBGs within target range (very important w/ limb threatening infection)  Consultants:  Ortho - Dr. Sharol Given   Procedures: none  Antimicrobials:  Aztreonam 6/8  Vancomycin 6/8 > 6/11 Levaquin 6/10 >  Objective: Blood pressure 127/86, pulse 77, temperature 98.1 F (36.7 C), temperature source Oral, resp. rate 18, height 5' 2"  (1.575 m), weight 86.5 kg (190 lb 11.2 oz), SpO2 98 %.  Intake/Output Summary (Last 24 hours) at 04/09/17 1123 Last data filed at 04/09/17 0900  Gross per 24 hour  Intake              420 ml  Output                0 ml  Net              420 ml   Filed Weights   04/06/17 1254 04/06/17 1856  Weight: 82.6 kg (182 lb) 86.5 kg (190 lb 11.2 oz)    Examination: General: No acute respiratory distress - alert and pleasant  Lungs: CTA B - no wheezing or crackles  Cardiovascular: RRR w/o M Abdomen: NT/ND, soft, bs+, no mass, no rebound  Extremities: No significant edema bilateral lower extremities - L heel dressed and dry   CBC:  Recent Labs Lab 04/06/17 1338 04/07/17 0230 04/08/17 0129  04/09/17 0242  WBC 5.1 6.9 5.4 5.0  NEUTROABS 3.5  --   --   --   HGB 10.9* 9.5* 9.6* 10.1*  HCT 32.3* 29.8* 30.2* 32.7*  MCV 79.4 80.1 82.7 83.4  PLT 152 140* 139* 409   Basic Metabolic Panel:  Recent Labs Lab 04/06/17 1338 04/07/17 0230 04/08/17 0129 04/09/17 0242  NA 124* 134* 134* 135  K 4.1 3.1* 3.6 4.6  CL 83* 101 100* 104  CO2 30 26 28 27   GLUCOSE 647* 159* 295* 283*  BUN 33* 18 16 21*  CREATININE 1.90* 1.42* 1.54* 1.54*  CALCIUM 9.0 8.0* 8.5* 8.9   GFR: Estimated Creatinine Clearance: 39.2 mL/min (A) (by C-G formula based on SCr of 1.54 mg/dL (H)).  Liver Function Tests:  Recent Labs Lab 04/07/17 0230 04/08/17 0129  AST 18 18  ALT 15 15  ALKPHOS 92 90  BILITOT  0.7 0.7  PROT 6.1* 6.0*  ALBUMIN 2.4* 2.3*    HbA1C: Hemoglobin A1C  Date/Time Value Ref Range Status  04/28/2016 03:34 PM 11.0  Final    Comment:    Larene Beach CMA received at the time.    Hgb A1c MFr Bld  Date/Time Value Ref Range Status  04/07/2017 02:30 AM 15.3 (H) 4.8 - 5.6 % Final    Comment:    (NOTE)         Pre-diabetes: 5.7 - 6.4         Diabetes: >6.4         Glycemic control for adults with diabetes: <7.0   01/03/2016 02:58 PM 10.8 (H) 4.6 - 6.5 % Final    Comment:    Glycemic Control Guidelines for People with Diabetes:Non Diabetic:  <6%Goal of Therapy: <7%Additional Action Suggested:  >8%     CBG:  Recent Labs Lab 04/08/17 0820 04/08/17 1304 04/08/17 1645 04/08/17 2120 04/09/17 0552  GLUCAP 225* 285* 261* 195* 230*    Recent Results (from the past 240 hour(s))  Blood Cultures x 2 sites     Status: None (Preliminary result)   Collection Time: 04/06/17  2:45 PM  Result Value Ref Range Status   Specimen Description BLOOD RIGHT ANTECUBITAL  Final   Special Requests   Final    BOTTLES DRAWN AEROBIC AND ANAEROBIC Blood Culture adequate volume   Culture   Final    NO GROWTH 2 DAYS Performed at [facility], [place] [address], [place], [place] [address]    Report Status PENDING  Incomplete  Blood Cultures x 2 sites     Status: None (Preliminary result)   Collection Time: 04/06/17  2:55 PM  Result Value Ref Range Status   Specimen Description BLOOD RIGHT FOREARM  Final   Special Requests   Final    BOTTLES DRAWN AEROBIC AND ANAEROBIC Blood Culture adequate volume   Culture   Final    NO GROWTH 2 DAYS Performed at [facility], [place] [address], [place], [place] [address]    Report Status PENDING  Incomplete  MRSA PCR Screening     Status: None   Collection Time: 04/06/17  7:04 PM  Result Value Ref Range Status   MRSA by PCR NEGATIVE NEGATIVE Final    Comment:        The GeneXpert MRSA Assay (FDA approved for NASAL specimens only), is one  component of a comprehensive MRSA colonization surveillance program. It is not intended to diagnose MRSA infection nor to guide or monitor treatment for MRSA infections.  Scheduled Meds: . atorvastatin  10 mg Oral q1800  . collagenase   Topical Daily  . DULoxetine  60 mg Oral Daily  . enoxaparin (LOVENOX) injection  40 mg Subcutaneous Q24H  . feeding supplement (GLUCERNA SHAKE)  237 mL Oral BID BM  . feeding supplement (PRO-STAT SUGAR FREE 64)  30 mL Oral BID  . fenofibrate  160 mg Oral Daily  . ferrous sulfate  325 mg Oral Q breakfast  . insulin aspart  0-20 Units Subcutaneous TID WC  . insulin aspart  0-5 Units Subcutaneous QHS  . insulin aspart  10 Units Subcutaneous TID WC  . insulin glargine  14 Units Subcutaneous Daily  . insulin glargine  40 Units Subcutaneous QHS  . polyethylene glycol  17 g Oral Daily      LOS: 3 days   Cherene Altes, MD [facility] Office  [phone] Pager - Text Page per Amion as per below:  On-Call/Text Page:      [email or web address]      password [email or web address]  If 7PM-7AM, please contact night-coverage [email or web address] Password [facility] 04/09/2017, 11:23 AM

## 2017-04-10 ENCOUNTER — Ambulatory Visit (INDEPENDENT_AMBULATORY_CARE_PROVIDER_SITE_OTHER): Payer: BLUE CROSS/BLUE SHIELD | Admitting: Orthopedic Surgery

## 2017-04-10 DIAGNOSIS — K7460 Unspecified cirrhosis of liver: Secondary | ICD-10-CM

## 2017-04-10 DIAGNOSIS — K7581 Nonalcoholic steatohepatitis (NASH): Secondary | ICD-10-CM

## 2017-04-10 LAB — GLUCOSE, CAPILLARY
Glucose-Capillary: 233 mg/dL — ABNORMAL HIGH (ref 65–99)
Glucose-Capillary: 256 mg/dL — ABNORMAL HIGH (ref 65–99)
Glucose-Capillary: 82 mg/dL (ref 65–99)
Glucose-Capillary: 116 mg/dL — ABNORMAL HIGH (ref 65–99)

## 2017-04-10 MED ORDER — LEVOFLOXACIN 500 MG PO TABS: 750.0000 mg | ORAL_TABLET | ORAL | Status: DC

## 2017-04-10 NOTE — Care Management Note (Signed)
Case Management Note  Patient Details  Name: [name] [name] MRN: [id number] Date of Birth: [birth year]  Subjective/Objective:   61 yr old female admitted with a chronic left heel ulcer. Patient not agreeable to any type of amputation at this time.                 Action/Plan: Case manager spoke with patient concerning discharge plan and DME needs. Choice for Home Health Agency was offered. CM called referral to [facility], [facility]. Patient states she has RW and 3in1 at home. CM will continue to monitor.   Expected Discharge Date:  04/10/17               Expected Discharge Plan:  [place]  In-House Referral:     Discharge planning Services  CM Consult  Post Acute Care Choice:  Home Health Choice offered to:  Patient  DME Arranged:  N/A DME Agency:  NA  HH Arranged:  PT [place] Agency:  [facility]  Status of Service:  In process, will continue to follow  If discussed at Long Length of Stay Meetings, dates discussed:    Additional Comments:  Ninfa Meeker, RN 04/10/2017, 11:48 AM

## 2017-04-10 NOTE — Progress Notes (Signed)
PROGRESS NOTE    [name] [name]  MWU:[id number] DOB: 12/30/1955 DOA: 04/06/2017 PCP: Ann Held, DO   Brief Narrative:  61 y.o. 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 Sharol Given)   who presented with fever to 101, syncope, malaise, and worsening heel pain.  In the ED she was found to be severely orthostatic w/ severe hyperglycemia.   Subjective: 6/12  A/O 4, left heel pain, negative CP, negative SOB, negative N/V.   Assessment & Plan:   Principal Problem:   Diabetic foot infection ([place]) Active Problems:   Essential hypertension   Hepatic cirrhosis (HCC)   Fibromyalgia   Normocytic anemia   CKD (chronic kidney disease), stage III   Diabetic peripheral neuropathy (HCC)   Type 2 diabetes mellitus with hyperglycemia, with long-term current use of insulin (HCC)   DM type II uncontrolled with complication/Severe hyperglycemia -6/9 Hemoglobin A1c = 15.3  -patient admits that she's had extreme difficulty getting her sugar any better than 200 in the outpatient setting  -I counseled her on the absolute need for strict control to give her foot the best chance of improving  - Increase A.m. Lantus 22 units -QHS Lantus 40 units -Increase NovoLog 16 units QAC -Resistant SSI  Left diabetic foot infection - Dr. Sharol Given has again recommended amputation, and has counseled her on the risk of life threatening sepsis. She persists in her decision to refuse amputation  -cont levaquin indefinitely  -Schedule one week follow-up with Dr. Meridee Score, left diabetic foot infection  Hypokalemia Corrected w/ supplementation   [name] cirrhosis -Albumin 2.3  - LFTs normal  - ammonia/mental status normal  - INR normal   CKD stage III -Creatinine is stable at approximately 1.5  Essential HTN -Blood pressure well controlled at this time   Anemia of  chronic disease Hgb stable   Fibromyalgia Appears well compensated at present  Obesity  - Body mass index is 34.88 kg/m.   DVT prophylaxis: *Lovenox Code Status: Full Family Communication: None  Disposition Plan: DC on 6/13 if CBG <200 on multiple readings   Consultants:  Orthopedic surgery Dr. Meridee Score   Procedures/Significant Events:  None   VENTILATOR SETTINGS: None   Cultures 6/8 blood NGTD    Antimicrobials: Anti-infectives    Start     Stop   04/12/17 1000  levofloxacin (LEVAQUIN) tablet 750 mg         04/09/17 1000  vancomycin (VANCOCIN) 1,250 mg in sodium chloride 0.9 % 250 mL IVPB     04/09/17 1116   04/08/17 1000  levofloxacin (LEVAQUIN) IVPB 750 mg  Status:  Discontinued    Comments:  Pharmacy may adjust dose prn   04/10/17 1206   04/08/17 1000  vancomycin (VANCOCIN) 1,500 mg in sodium chloride 0.9 % 500 mL IVPB     04/08/17 1241   04/08/17 0915  levofloxacin (LEVAQUIN) IVPB 750 mg  Status:  Discontinued     04/08/17 0914   04/06/17 1700  aztreonam (AZACTAM) 1 g in dextrose 5 % 50 mL IVPB  Status:  Discontinued     04/06/17 2004   04/06/17 1700  vancomycin (VANCOCIN) IVPB 1000 mg/200 mL premix  Status:  Discontinued     04/06/17 2004   04/06/17 1651  aztreonam (AZACTAM) 1 g injection    Comments:  Owens Shark, Adam   : cabinet override   04/06/17 1702  Devices None   LINES / TUBES:  None    Continuous Infusions:   Objective: Vitals:   04/09/17 0400 04/09/17 1300 04/09/17 2000 04/10/17 0620  BP: 127/86 138/66 (!) 117/54 (!) 113/58  Pulse: 77 75 82 70  Resp: 18 18 18 18   Temp: 98.1 F (36.7 C) 98.5 F (36.9 C) 98.2 F (36.8 C) 98.2 F (36.8 C)  TempSrc: Oral Oral Oral Oral  SpO2: 98% 100% 100% 100%  Weight:      Height:        Intake/Output Summary (Last 24 hours) at 04/10/17 1639 Last data filed at 04/10/17 5400  Gross per 24 hour  Intake              600 ml  Output                0 ml  Net              600 ml     Filed Weights   04/06/17 1254 04/06/17 1856  Weight: 182 lb (82.6 kg) 190 lb 11.2 oz (86.5 kg)    Examination:  General: A/O 4, NAD, No acute respiratory distress Eyes: negative scleral hemorrhage, negative anisocoria, negative icterus ENT: Negative Runny nose, negative gingival bleeding, Neck:  Negative scars, masses, torticollis, lymphadenopathy, JVD Lungs: Clear to auscultation bilaterally without wheezes or crackles Cardiovascular: Regular rate and rhythm without murmur gallop or rub normal S1 and S2 Abdomen: negative abdominal pain, nondistended, positive soft, bowel sounds, no rebound, no ascites, no appreciable mass Extremities: No significant cyanosis, clubbing. Left lower extremity swelling, left foot wrapped in gauze bandage. Skin: Negative rashes, lesions, ulcers Psychiatric:  Negative depression, negative anxiety, negative fatigue, negative mania  Central nervous system:  Cranial nerves II through XII intact, tongue/uvula midline, all extremities muscle strength 5/5, sensation intact throughout,  negative dysarthria, negative expressive aphasia, negative receptive aphasia.  .     Data Reviewed: Care during the described time interval was provided by me .  I have reviewed this patient's available data, including medical history, events of note, physical examination, and all test results as part of my evaluation. I have personally reviewed and interpreted all radiology studies.  CBC:  Recent Labs Lab 04/06/17 1338 04/07/17 0230 04/08/17 0129 04/09/17 0242  WBC 5.1 6.9 5.4 5.0  NEUTROABS 3.5  --   --   --   HGB 10.9* 9.5* 9.6* 10.1*  HCT 32.3* 29.8* 30.2* 32.7*  MCV 79.4 80.1 82.7 83.4  PLT 152 140* 139* 867   Basic Metabolic Panel:  Recent Labs Lab 04/06/17 1338 04/07/17 0230 04/08/17 0129 04/09/17 0242  NA 124* 134* 134* 135  K 4.1 3.1* 3.6 4.6  CL 83* 101 100* 104  CO2 30 26 28 27   GLUCOSE 647* 159* 295* 283*  BUN 33* 18 16 21*  CREATININE 1.90*  1.42* 1.54* 1.54*  CALCIUM 9.0 8.0* 8.5* 8.9   GFR: Estimated Creatinine Clearance: 39.2 mL/min (A) (by C-G formula based on SCr of 1.54 mg/dL (H)). Liver Function Tests:  Recent Labs Lab 04/07/17 0230 04/08/17 0129  AST 18 18  ALT 15 15  ALKPHOS 92 90  BILITOT 0.7 0.7  PROT 6.1* 6.0*  ALBUMIN 2.4* 2.3*   No results for input(s): LIPASE, AMYLASE in the last 168 hours. No results for input(s): AMMONIA in the last 168 hours. Coagulation Profile:  Recent Labs Lab 04/09/17 0242  INR 1.16   Cardiac Enzymes: No results for input(s): CKTOTAL, CKMB, CKMBINDEX, TROPONINI in  the last 168 hours. BNP (last 3 results) No results for input(s): PROBNP in the last 8760 hours. HbA1C: No results for input(s): HGBA1C in the last 72 hours. CBG:  Recent Labs Lab 04/09/17 1212 04/09/17 1637 04/09/17 2125 04/10/17 0616 04/10/17 1137  GLUCAP 207* 205* 158* 233* 256*   Lipid Profile: No results for input(s): CHOL, HDL, LDLCALC, TRIG, CHOLHDL, LDLDIRECT in the last 72 hours. Thyroid Function Tests: No results for input(s): TSH, T4TOTAL, FREET4, T3FREE, THYROIDAB in the last 72 hours. Anemia Panel: No results for input(s): VITAMINB12, FOLATE, FERRITIN, TIBC, IRON, RETICCTPCT in the last 72 hours. Urine analysis:    Component Value Date/Time   COLORURINE STRAW (A) 04/06/2017 2044   APPEARANCEUR CLEAR 04/06/2017 2044   LABSPEC 1.011 04/06/2017 2044   PHURINE 6.0 04/06/2017 2044   GLUCOSEU >=500 (A) 04/06/2017 2044   GLUCOSEU >=1000 (A) 01/11/2017 1335   HGBUR LARGE (A) 04/06/2017 2044   HGBUR moderate 08/05/2010 1048   BILIRUBINUR NEGATIVE 04/06/2017 2044   BILIRUBINUR neg 01/05/2017 [name] 04/06/2017 2044   PROTEINUR NEGATIVE 04/06/2017 2044   UROBILINOGEN 0.2 01/11/2017 1335   NITRITE NEGATIVE 04/06/2017 2044   LEUKOCYTESUR NEGATIVE 04/06/2017 2044   Sepsis Labs: @LABRCNTIP (procalcitonin:4,lacticidven:4)  ) Recent Results (from the past 240 hour(s))    Blood Cultures x 2 sites     Status: None (Preliminary result)   Collection Time: 04/06/17  2:45 PM  Result Value Ref Range Status   Specimen Description BLOOD RIGHT ANTECUBITAL  Final   Special Requests   Final    BOTTLES DRAWN AEROBIC AND ANAEROBIC Blood Culture adequate volume   Culture   Final    NO GROWTH 4 DAYS Performed at [facility], [place] [address], [place], [place] [address]    Report Status PENDING  Incomplete  Blood Cultures x 2 sites     Status: None (Preliminary result)   Collection Time: 04/06/17  2:55 PM  Result Value Ref Range Status   Specimen Description BLOOD RIGHT FOREARM  Final   Special Requests   Final    BOTTLES DRAWN AEROBIC AND ANAEROBIC Blood Culture adequate volume   Culture   Final    NO GROWTH 4 DAYS Performed at [facility], [place] [address], [place], [place] [address]    Report Status PENDING  Incomplete  MRSA PCR Screening     Status: None   Collection Time: 04/06/17  7:04 PM  Result Value Ref Range Status   MRSA by PCR NEGATIVE NEGATIVE Final    Comment:        The GeneXpert MRSA Assay (FDA approved for NASAL specimens only), is one component of a comprehensive MRSA colonization surveillance program. It is not intended to diagnose MRSA infection nor to guide or monitor treatment for MRSA infections.          Radiology Studies: No results found.      Scheduled Meds: . atorvastatin  10 mg Oral q1800  . collagenase   Topical Daily  . DULoxetine  60 mg Oral Daily  . enoxaparin (LOVENOX) injection  40 mg Subcutaneous Q24H  . feeding supplement (GLUCERNA SHAKE)  237 mL Oral BID BM  . feeding supplement (PRO-STAT SUGAR FREE 64)  30 mL Oral BID  . fenofibrate  160 mg Oral Daily  . ferrous sulfate  325 mg Oral Q breakfast  . insulin aspart  0-20 Units Subcutaneous TID WC  . insulin aspart  0-5 Units Subcutaneous QHS  . insulin  aspart  12 Units Subcutaneous TID WC  . insulin glargine  18 Units Subcutaneous Daily  .  insulin glargine  40 Units Subcutaneous QHS  . [START ON 04/12/2017] levofloxacin  750 mg Oral Q48H  . polyethylene glycol  17 g Oral Daily   Continuous Infusions:   LOS: 4 days    Time spent: 40 minutes    Chananya Canizalez, Geraldo Docker, MD [facility] Pager [phone]   If 7PM-7AM, please contact night-coverage [email or web address] Password [facility] 04/10/2017, 4:39 PM

## 2017-04-10 NOTE — Progress Notes (Signed)
Physical Therapy Treatment Patient Details Name: [name] [name] MRN: [id number] DOB: 06-01-1956 Today's Date: 04/10/2017    History of Present Illness Pt is a 61 yo female admitted with L foot pain accompanied by fever and syncope, dx with diabetic foot infection related to chronic L foot ulcer. PMH for uncontrolled DM, HTN, CKD, Nash cirrohsis and fibromyalgia,      PT Comments    Pt is making steady progress toward their goals. Pt currently minA for transfers to RW and modAx1 for ambulation of 12 feet with RW. Pt experienced 1xLoB with ambulation requiring modAx1 for steadying. Pt requires skilled PT to continue to progress ambulation and to improve LE strength, and endurance to safely mobilize in her discharge environment.    Follow Up Recommendations  Home health PT     Equipment Recommendations  None recommended by PT    Recommendations for Other Services       Precautions / Restrictions Precautions Precautions: Fall Restrictions Weight Bearing Restrictions: Yes LLE Weight Bearing: Non weight bearing    Mobility  Bed Mobility               General bed mobility comments: in recliner at entry  Transfers Overall transfer level: Needs assistance Equipment used: Rolling walker (2 wheeled) Transfers: Sit to/from Stand Sit to Stand: Min assist         General transfer comment: minA for steadying in upright vc for push off from recliner arms instead of RW  Ambulation/Gait Ambulation/Gait assistance: Mod assist Ambulation Distance (Feet): 12 Feet Assistive device: Rolling walker (2 wheeled) Gait Pattern/deviations:  (hop to pattern) Gait velocity: slowed Gait velocity interpretation: Below normal speed for age/gender General Gait Details: modA for steadying after 1xLoB, vc for staying inside walker, and for maintaining NWB       Balance Overall balance assessment: Needs assistance         Standing balance support: Bilateral upper extremity  supported Standing balance-Leahy Scale: Poor Standing balance comment: required minA for steadying on standing                            Cognition Arousal/Alertness: Awake/alert Behavior During Therapy: WFL for tasks assessed/performed Overall Cognitive Status: Within Functional Limits for tasks assessed                                        Exercises General Exercises - Lower Extremity Long Arc Quad: AROM;10 reps;Both;Seated Hip ABduction/ADduction: AROM;Both;10 reps;Seated Hip Flexion/Marching: AROM;Both;10 reps        Pertinent Vitals/Pain Pain Assessment: Faces Faces Pain Scale: Hurts a little bit Pain Location: L foot Pain Descriptors / Indicators: Pins and needles;Throbbing;Tingling Pain Intervention(s): Monitored during session  VSS           PT Goals (current goals can now be found in the care plan section) Acute Rehab PT Goals Patient Stated Goal: go home as soon as possible PT Goal Formulation: With patient Time For Goal Achievement: 04/23/17 Potential to Achieve Goals: Good Progress towards PT goals: Progressing toward goals    Frequency    Min 3X/week      PT Plan Current plan remains appropriate       AM-PAC PT "6 Clicks" Daily Activity  Outcome Measure  Difficulty turning over in bed (including adjusting bedclothes, sheets and blankets)?: A Little Difficulty moving from lying on back  to sitting on the side of the bed? : A Little Difficulty sitting down on and standing up from a chair with arms (e.g., wheelchair, bedside commode, etc,.)?: A Lot Help needed moving to and from a bed to chair (including a wheelchair)?: A Little Help needed walking in hospital room?: A Little Help needed climbing 3-5 steps with a railing? : Total 6 Click Score: 15    End of Session Equipment Utilized During Treatment: Gait belt Activity Tolerance: Patient limited by pain Patient left: in chair;with call bell/phone within  reach Nurse Communication: Mobility status;Weight bearing status;Precautions PT Visit Diagnosis: Unsteadiness on feet (R26.81);Other abnormalities of gait and mobility (R26.89);Muscle weakness (generalized) (M62.81);Difficulty in walking, not elsewhere classified (R26.2);Pain;Other symptoms and signs involving the nervous system (R29.898) Pain - Right/Left: Left Pain - part of body: Ankle and joints of foot     Time: 3335-4562 PT Time Calculation (min) (ACUTE ONLY): 16 min  Charges:  $Therapeutic Exercise: 8-22 mins                    G Codes:       Korde Jeppsen B. Migdalia Dk PT, DPT Acute Rehabilitation  [phone] Pager [phone]     [place] 04/10/2017, 2:38 PM

## 2017-04-11 LAB — CULTURE, BLOOD (ROUTINE X 2)
CULTURE: NO GROWTH
CULTURE: NO GROWTH
SPECIAL REQUESTS: ADEQUATE
SPECIAL REQUESTS: ADEQUATE

## 2017-04-11 LAB — GLUCOSE, CAPILLARY
Glucose-Capillary: 151 mg/dL — ABNORMAL HIGH (ref 65–99)
Glucose-Capillary: 137 mg/dL — ABNORMAL HIGH (ref 65–99)
Glucose-Capillary: 108 mg/dL — ABNORMAL HIGH (ref 65–99)

## 2017-04-11 MED ORDER — INSULIN GLARGINE 100 UNIT/ML SOLOSTAR PEN: PEN_INJECTOR | SUBCUTANEOUS | 0 refills | Status: DC

## 2017-04-11 MED ORDER — OXYCODONE HCL 5 MG PO TABS: 5.0000 mg | ORAL_TABLET | Freq: Four times a day (QID) | ORAL | 0 refills | Status: DC | PRN

## 2017-04-11 MED ORDER — COLLAGENASE 250 UNIT/GM EX OINT: TOPICAL_OINTMENT | Freq: Every day | CUTANEOUS | 0 refills | Status: DC

## 2017-04-11 MED ORDER — INSULIN ASPART 100 UNIT/ML ~~LOC~~ SOLN: 12.0000 [IU] | Freq: Three times a day (TID) | SUBCUTANEOUS | 0 refills | Status: DC

## 2017-04-11 MED ORDER — LEVOFLOXACIN 750 MG PO TABS: 750.0000 mg | ORAL_TABLET | ORAL | 0 refills | Status: DC

## 2017-04-11 NOTE — Progress Notes (Signed)
Patient to be discharged. Discharge instructions and prescriptions reviewed with patient. Patient stated understanding. IV removed. Patient is waiting on her husband for transportation.

## 2017-04-11 NOTE — Discharge Instructions (Signed)
Bone and Joint Infections, Adult  Bone infections (osteomyelitis) and joint infections (septic arthritis) occur when bacteria or other germs get inside a bone or a joint. This can happen if you have an infection in another part of your body that spreads through your blood. Germs from your skin or from outside of your body can also cause this type of infection if you have a wound or a broken bone (fracture) that breaks the skin. Anyone can get a bone infection or joint infection. You may be more likely to get this type of infection if you have a condition, such as diabetes, that lowers your ability to fight infection or increases your chances of getting an infection. Bone and joint infections can cause damage, and they can spread to other areas of your body. They need to be treated quickly. What are the causes? Most bone and joint infections are caused by bacteria. They can also be caused by other germs, such as viruses and funguses. What increases the risk? This condition is more likely to develop in:  People who recently had surgery, especially bone or joint surgery.  People who have a long-term (chronic) disease, such as: ? HIV (human immunodeficiency virus). ? Diabetes. ? Rheumatoid arthritis. ? Sickle cell anemia.  Elderly people.  People who take medicines that block or weaken the bodys defense system (immune system).  People who have a condition that reduces their blood flow.  People who are on kidney dialysis.  People who have an artificial joint.  People who have had a joint or bone repaired with plates or screws (surgical hardware).  People who use or abuse IV drugs.  People who have had trauma, such as stepping on a nail.  What are the signs or symptoms? Symptoms vary depending on the type and location of your infection. Common symptoms of bone and joint infections include:  Fever and chills.  Redness and warmth.  Swelling.  Pain and stiffness.  Drainage of  fluid or pus near the infection.  Weight loss and fatigue.  Decreased ability to use a hand or foot.  How is this diagnosed? This condition may be diagnosed based on symptoms, medical history, a physical exam, and diagnostic tests. Tests can help to identify the cause of the infection. You may have various tests, such as:  A sample of tissue, fluid, or blood taken to be examined under a microscope.  A procedure to remove fluid from the infected joint with a needle (joint aspiration) for testing in a lab.  Pus or discharge swabbed from a wound for testing to identify germs and to determine what type of medicine will kill them (culture and sensitivity).  Blood tests to look for evidence of infection and inflammation (biomarkers).  Imaging studies to determine how severe the bone or joint infection is. These may include: ? X-rays. ? CT scan. ? MRI. ? Bone scan.  How is this treated? Treatment depends on the cause and type of infection. Antibiotic medicines are usually the first treatment for a bone or joint infection. Treatment with antibiotics may include:  Getting IV antibiotics. This may be done in a hospital at first. You may have to continue IV antibiotics at home for several weeks. You may also have to take antibiotics by mouth for several weeks after that.  Taking more than one kind of antibiotic. Treatment may start with a type of antibiotic that works against many different bacteria (broad spectrumantibiotics). IV antibiotics may be changed if tests show that  another type may work better.  Other treatments may include:  Draining fluid from the joint by placing a needle into it (aspiration).  Surgery to remove: ? Dead or dying tissue from a bone or joint. ? An infected artificial joint. ? Infected plates or screws that were used to repair a broken bone.  Follow these instructions at home:  Take medicines only as directed by your health care provider.  Take your  antibiotic medicine as directed by your health care provider. Finish the antibiotic even if you start to feel better.  Follow instructions from your health care provider about how to take IV antibiotics at home.  Ask your health care provider if you have any restrictions on your activities.  Keep all follow-up visits as directed by your health care provider. This is important. Contact a health care provider if:  You have a fever or chills.  You have redness, warmth, pain, or swelling that returns after treatment. Get help right away if:  You have rapid breathing or you have trouble breathing.  You have chest pain.  You cannot drink fluids or make urine.  The affected arm or leg swells, changes color, or turns blue. This information is not intended to replace advice given to you by your health care provider. Make sure you discuss any questions you have with your health care provider. Document Released: 10/16/2005 Document Revised: 03/23/2016 Document Reviewed: 10/14/2014 Elsevier Interactive Patient Education  2018 [employer].  Diabetes and Foot Care  Diabetes may cause you to have problems because of poor blood supply (circulation) to your feet and legs. This may cause the skin on your feet to become thinner, break easier, and heal more slowly. Your skin may become dry, and the skin may peel and crack. You may also have nerve damage in your legs and feet causing decreased feeling in them. You may not notice minor injuries to your feet that could lead to infections or more serious problems. Taking care of your feet is one of the most important things you can do for yourself. Follow these instructions at home:  Wear shoes at all times, even in the house. Do not go barefoot. Bare feet are easily injured.  Check your feet daily for blisters, cuts, and redness. If you cannot see the bottom of your feet, use a mirror or ask someone for help.  Wash your feet with warm water (do not use  hot water) and mild soap. Then pat your feet and the areas between your toes until they are completely dry. Do not soak your feet as this can dry your skin.  Apply a moisturizing lotion or petroleum jelly (that does not contain alcohol and is unscented) to the skin on your feet and to dry, brittle toenails. Do not apply lotion between your toes.  Trim your toenails straight across. Do not dig under them or around the cuticle. File the edges of your nails with an emery board or nail file.  Do not cut corns or calluses or try to remove them with medicine.  Wear clean socks or stockings every day. Make sure they are not too tight. Do not wear knee-high stockings since they may decrease blood flow to your legs.  Wear shoes that fit properly and have enough cushioning. To break in new shoes, wear them for just a few hours a day. This prevents you from injuring your feet. Always look in your shoes before you put them on to be sure there are no  objects inside.  Do not cross your legs. This may decrease the blood flow to your feet.  If you find a minor scrape, cut, or break in the skin on your feet, keep it and the skin around it clean and dry. These areas may be cleansed with mild soap and water. Do not cleanse the area with peroxide, alcohol, or iodine.  When you remove an adhesive bandage, be sure not to damage the skin around it.  If you have a wound, look at it several times a day to make sure it is healing.  Do not use heating pads or hot water bottles. They may burn your skin. If you have lost feeling in your feet or legs, you may not know it is happening until it is too late.  Make sure your health care provider performs a complete foot exam at least annually or more often if you have foot problems. Report any cuts, sores, or bruises to your health care provider immediately. Contact a health care provider if:  You have an injury that is not healing.  You have cuts or breaks in the  skin.  You have an ingrown nail.  You notice redness on your legs or feet.  You feel burning or tingling in your legs or feet.  You have pain or cramps in your legs and feet.  Your legs or feet are numb.  Your feet always feel cold. Get help right away if:  There is increasing redness, swelling, or pain in or around a wound.  There is a red line that goes up your leg.  Pus is coming from a wound.  You develop a fever or as directed by your health care provider.  You notice a bad smell coming from an ulcer or wound. This information is not intended to replace advice given to you by your health care provider. Make sure you discuss any questions you have with your health care provider. Document Released: 10/13/2000 Document Revised: 03/23/2016 Document Reviewed: 03/25/2013 Elsevier Interactive Patient Education  2017 [place] for Diabetes Mellitus, Adult Carbohydrate counting is a method for keeping track of how many carbohydrates you eat. Eating carbohydrates naturally increases the amount of sugar (glucose) in the blood. Counting how many carbohydrates you eat helps keep your blood glucose within normal limits, which helps you manage your diabetes (diabetes mellitus). It is important to know how many carbohydrates you can safely have in each meal. This is different for every person. A diet and nutrition specialist (registered dietitian) can help you make a meal plan and calculate how many carbohydrates you should have at each meal and snack. Carbohydrates are found in the following foods:  Grains, such as breads and cereals.  Dried beans and soy products.  Starchy vegetables, such as potatoes, peas, and corn.  Fruit and fruit juices.  Milk and yogurt.  Sweets and snack foods, such as cake, cookies, candy, chips, and soft drinks.  How do I count carbohydrates? There are two ways to count carbohydrates in food. You can use either of the  methods or a combination of both. Reading "Nutrition Facts" on packaged food The "Nutrition Facts" list is included on the labels of almost all packaged foods and beverages in the [place] It includes:  The serving size.  Information about nutrients in each serving, including the grams (g) of carbohydrate per serving.  To use the Nutrition Facts":  Decide how many servings you will have.  Multiply the number  of servings by the number of carbohydrates per serving.  The resulting number is the total amount of carbohydrates that you will be having.  Learning standard serving sizes of other foods When you eat foods containing carbohydrates that are not packaged or do not include "Nutrition Facts" on the label, you need to measure the servings in order to count the amount of carbohydrates:  Measure the foods that you will eat with a food scale or measuring cup, if needed.  Decide how many standard-size servings you will eat.  Multiply the number of servings by 15. Most carbohydrate-rich foods have about 15 g of carbohydrates per serving. ? For example, if you eat 8 oz (170 g) of strawberries, you will have eaten 2 servings and 30 g of carbohydrates (2 servings x 15 g = 30 g).  For foods that have more than one food mixed, such as soups and casseroles, you must count the carbohydrates in each food that is included.  The following list contains standard serving sizes of common carbohydrate-rich foods. Each of these servings has about 15 g of carbohydrates:   hamburger bun or  English muffin.   oz (15 mL) syrup.   oz (14 g) jelly.  1 slice of bread.  1 six-inch tortilla.  3 oz (85 g) cooked rice or pasta.  4 oz (113 g) cooked dried beans.  4 oz (113 g) starchy vegetable, such as peas, corn, or potatoes.  4 oz (113 g) hot cereal.  4 oz (113 g) mashed potatoes or  of a large baked potato.  4 oz (113 g) canned or frozen fruit.  4 oz (120 mL) fruit juice.  4-6  crackers.  6 chicken nuggets.  6 oz (170 g) unsweetened dry cereal.  6 oz (170 g) plain fat-free yogurt or yogurt sweetened with artificial sweeteners.  8 oz (240 mL) milk.  8 oz (170 g) fresh fruit or one small piece of fruit.  24 oz (680 g) popped popcorn.  Example of carbohydrate counting Sample meal  3 oz (85 g) chicken breast.  6 oz (170 g) brown rice.  4 oz (113 g) corn.  8 oz (240 mL) milk.  8 oz (170 g) strawberries with sugar-free whipped topping. Carbohydrate calculation 1. Identify the foods that contain carbohydrates: ? Rice. ? Corn. ? Milk. ? Strawberries. 2. Calculate how many servings you have of each food: ? 2 servings rice. ? 1 serving corn. ? 1 serving milk. ? 1 serving strawberries. 3. Multiply each number of servings by 15 g: ? 2 servings rice x 15 g = 30 g. ? 1 serving corn x 15 g = 15 g. ? 1 serving milk x 15 g = 15 g. ? 1 serving strawberries x 15 g = 15 g. 4. Add together all of the amounts to find the total grams of carbohydrates eaten: ? 30 g + 15 g + 15 g + 15 g = 75 g of carbohydrates total. This information is not intended to replace advice given to you by your health care provider. Make sure you discuss any questions you have with your health care provider. Document Released: 10/16/2005 Document Revised: 05/05/2016 Document Reviewed: 03/29/2016 Elsevier Interactive Patient Education  2018 [place].  Blood Glucose Monitoring, Adult Monitoring your blood sugar (glucose) helps you manage your diabetes. It also helps you and your health care provider determine how well your diabetes management plan is working. Blood glucose monitoring involves checking your blood glucose as often as directed,  and keeping a record (log) of your results over time. Why should I monitor my blood glucose? Checking your blood glucose regularly can:  Help you understand how food, exercise, illnesses, and medicines affect your blood glucose.  Let you know  what your blood glucose is at any time. You can quickly tell if you are having low blood glucose (hypoglycemia) or high blood glucose (hyperglycemia).  Help you and your health care provider adjust your medicines as needed.  When should I check my blood glucose? Follow instructions from your health care provider about how often to check your blood glucose. This may depend on:  The type of diabetes you have.  How well-controlled your diabetes is.  Medicines you are taking.  If you have type 1 diabetes:  Check your blood glucose at least 2 times a day.  Also check your blood glucose: ? Before every insulin injection. ? Before and after exercise. ? Between meals. ? 2 hours after a meal. ? Occasionally between 2:00 a.m. and 3:00 a.m., as directed. ? Before potentially dangerous tasks, like driving or using heavy machinery. ? At bedtime.  You may need to check your blood glucose more often, up to 6-10 times a day: ? If you use an insulin pump. ? If you need multiple daily injections (MDI). ? If your diabetes is not well-controlled. ? If you are ill. ? If you have a history of severe hypoglycemia. ? If you have a history of not knowing when your blood glucose is getting low (hypoglycemia unawareness). If you have type 2 diabetes:  If you take insulin or other diabetes medicines, check your blood glucose at least 2 times a day.  If you are on intensive insulin therapy, check your blood glucose at least 4 times a day. Occasionally, you may also need to check between 2:00 a.m. and 3:00 a.m., as directed.  Also check your blood glucose: ? Before and after exercise. ? Before potentially dangerous tasks, like driving or using heavy machinery.  You may need to check your blood glucose more often if: ? Your medicine is being adjusted. ? Your diabetes is not well-controlled. ? You are ill. What is a blood glucose log?  A blood glucose log is a record of your blood glucose readings.  It helps you and your health care provider: ? Look for patterns in your blood glucose over time. ? Adjust your diabetes management plan as needed.  Every time you check your blood glucose, write down your result and notes about things that may be affecting your blood glucose, such as your diet and exercise for the day.  Most glucose meters store a record of glucose readings in the meter. Some meters allow you to download your records to a computer. How do I check my blood glucose? Follow these steps to get accurate readings of your blood glucose: Supplies needed   Blood glucose meter.  Test strips for your meter. Each meter has its own strips. You must use the strips that come with your meter.  A needle to prick your finger (lancet). Do not use lancets more than once.  A device that holds the lancet (lancing device).  A journal or log book to write down your results. Procedure  Wash your hands with soap and water.  Prick the side of your finger (not the tip) with the lancet. Use a different finger each time.  Gently rub the finger until a small drop of blood appears.  Follow instructions that come  with your meter for inserting the test strip, applying blood to the strip, and using your blood glucose meter.  Write down your result and any notes. Alternative testing sites  Some meters allow you to use areas of your body other than your finger (alternative sites) to test your blood.  If you think you may have hypoglycemia, or if you have hypoglycemia unawareness, do not use alternative sites. Use your finger instead.  Alternative sites may not be as accurate as the fingers, because blood flow is slower in these areas. This means that the result you get may be delayed, and it may be different from the result that you would get from your finger.  The most common alternative sites are: ? Forearm. ? Thigh. ? Palm of the hand. Additional tips  Always keep your supplies with  you.  If you have questions or need help, all blood glucose meters have a 24-hour hotline number that you can call. You may also contact your health care provider.  After you use a few boxes of test strips, adjust (calibrate) your blood glucose meter by following instructions that came with your meter. This information is not intended to replace advice given to you by your health care provider. Make sure you discuss any questions you have with your health care provider. Document Released: 10/19/2003 Document Revised: 05/05/2016 Document Reviewed: 03/27/2016 Elsevier Interactive Patient Education  2017 [employer].

## 2017-04-11 NOTE — Progress Notes (Signed)
PT Cancellation Note  Patient Details Name: [name] [name] MRN: [id number] DOB: 1955/12/07   Cancelled Treatment:    Reason Eval/Treat Not Completed: Other (comment);Patient declined, no reason specified (Pt reports she is confindent in her abilities to mobilize with her RW at home.  She reports no further questions.  )   [place] Ellwood Eli Hose 04/11/2017, 12:50 PM Governor Rooks, PTA pager [phone]

## 2017-04-11 NOTE — Discharge Summary (Signed)
DISCHARGE SUMMARY  [name] [name]  MR#: [id number]  DOB:22-Nov-1955  Date of Admission: 04/06/2017 Date of Discharge: 04/11/2017  Attending Physician:Merville Hijazi T  Patient's TZG:YFVCB [name] Shiver, DO  Consults: Orthopedics - Dr. Sharol Given  Disposition: D/C home   Follow-up Appts: Follow-up Information    [name] Minion, MD. Schedule an appointment as soon as possible for a visit in 1 week(s).   Specialty:  Orthopedic Surgery Why:  Schedule one week follow-up with Dr. Meridee Score, left diabetic foot infection Contact information: [place] [address] [phone]        [facility] Follow up.   Specialty:  Home Health Services Why:  A representative from [facility] will contact you to arrange start date and time for your therapy. Contact information: [name] [place] [address] [phone]           Tests Needing Follow-up: -ongoing wound care -close monitoring of CBG w/ titration of DM meds as indicated  -monitoring for signs/symptoms of recurring sepsis which would require life saving amputation   Wound Care: Cleanse wound to left heel. Santyl to wound bed.  Cover with  NS moist gauze/4x4 gauze and kerlix/tape.  Change daily.    Discharge Diagnoses: Severe hyperglycemia in uncontrolled DM 2 Left diabetic foot infection Hypokalemia [name] [name] cirrhosis CKD stage III HTN Anemia of chronic disease Fibromyalgia Obesity - Body mass index is 34.88 kg/m.  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 Sharol Given) who presented with fever to 101, syncope, malaise, and worsening heel pain.  In the ED she was found to be severely orthostatic w/ severe hyperglycemia.  She was septic from her L heel infection.    Hospital Course:  Severe hyperglycemia in uncontrolled DM 2 the patient  admitted that she's had extreme difficulty getting her sugar any better than 200 in the outpatient setting - I counseled her on the absolute need for strict control to give her foot the best chance of improving - insulin dosing titrated frequently - CBG within target at time of d/c - need to follow strict DM diet stressed to pt - need to follow CBG TID and to report her numbers to her PCP stressed to pt    Left diabetic foot infection Cont broad spectrum empiric abx indefinitely w/ transition to oral tx at d/c - Dr. Sharol Given has recommended amputation, and has counseled her on the risk of life threatening sepsis - she persists in her decision to refuse amputation - pt to f/u w/ Dr. Sharol Given in the office - wound care arranged via [name] [name] at D/C  Hypokalemia Corrected w/ supplementation   [name] [name] cirrhosis Albumin 2.3 - LFTs normal - ammonia/mental status normal - INR normal   CKD stage III Creatinine is stable at approximately 1.5 at time of d/c   HTN Blood pressure well controlled at time of d/c   Anemia of chronic disease Hgb stable   Fibromyalgia Appears well compensated at present  Obesity - Body mass index is 34.88 kg/m.   Allergies as of 04/11/2017      Reactions   Fish-derived Products Hives, Shortness Of Breath, Swelling, Rash   Hives get in throat causing trouble breathing   Mushroom Extract Complex Anaphylaxis   Penicillins Anaphylaxis, Swelling   Rosemary Oil Anaphylaxis   Shellfish Allergy Hives, Shortness Of Breath, Swelling, Rash  Tomato Hives, Shortness Of Breath   Hives in throat causes her trouble breathing   Acetaminophen    Gi upset   Acyclovir And Related    Aloe Vera    hives   Other       Medication List    STOP taking these medications   hydrochlorothiazide 25 MG tablet Commonly known as:  HYDRODIURIL   HYDROcodone-acetaminophen 5-325 MG tablet Commonly known as:  NORCO/VICODIN   Insulin Glulisine 100 UNIT/ML Solostar Pen Commonly known as:   APIDRA SOLOSTAR   levocetirizine 5 MG tablet Commonly known as:  XYZAL   lisinopril 5 MG tablet Commonly known as:  PRINIVIL,ZESTRIL   silver sulfADIAZINE 1 % cream Commonly known as:  SILVADENE     TAKE these medications   atorvastatin 10 MG tablet Commonly known as:  LIPITOR TAKE ONE TABLET BY MOUTH ONCE DAILY   CALTRATE 600+D 600-400 MG-UNIT tablet Generic drug:  Calcium Carbonate-Vitamin D Take 1 tablet by mouth daily.   CINNAMON PO Take 1,000 mg by mouth daily.   collagenase ointment Commonly known as:  SANTYL Apply topically daily. Start taking on:  04/12/2017   DULoxetine 60 MG capsule Commonly known as:  CYMBALTA TAKE ONE CAPSULE BY MOUTH ONCE DAILY   ENSURE PLUS Liqd Take 237 mLs by mouth daily.   fenofibrate 160 MG tablet Take 1 tablet (160 mg total) by mouth daily.   Flax Seed Oil 1000 MG Caps Take 1 capsule by mouth at bedtime.   insulin aspart 100 UNIT/ML injection Commonly known as:  novoLOG Inject 12 Units into the skin 3 (three) times daily with meals.   Insulin Glargine 100 UNIT/ML Solostar Pen Commonly known as:  LANTUS SOLOSTAR Inject 18 units each morning, and 40 units QHS What changed:  See the new instructions.   levofloxacin 750 MG tablet Commonly known as:  LEVAQUIN Take 1 tablet (750 mg total) by mouth every other day. Start taking on:  04/12/2017   LYRICA 150 MG capsule Generic drug:  pregabalin TAKE ONE CAPSULE BY MOUTH THREE TIMES DAILY AS NEEDED   multivitamin tablet Take 1 tablet by mouth daily.   ondansetron 8 MG disintegrating tablet Commonly known as:  ZOFRAN ODT Take 1 tablet (8 mg total) by mouth every 8 (eight) hours as needed for nausea or vomiting.   oxyCODONE 5 MG immediate release tablet Commonly known as:  Oxy IR/ROXICODONE Take 1 tablet (5 mg total) by mouth every 6 (six) hours as needed for severe pain. What changed:  when to take this   polyethylene glycol packet Commonly known as:  MIRALAX /  GLYCOLAX Take 17 g by mouth daily.   SLOW FE 160 (50 Fe) MG Tbcr SR tablet Generic drug:  ferrous sulfate Take 160 mg by mouth daily.   vitamin C 500 MG tablet Commonly known as:  ASCORBIC ACID Take 500 mg by mouth daily.   Vitamin D3 5000 units Caps Take 1 capsule by mouth 2 (two) times daily.       Day of Discharge BP (!) 152/75 (BP Location: Right Arm)   Pulse 74   Temp 97.8 F (36.6 C) (Oral)   Resp 20   Ht 5' 2"  (1.575 m)   Wt 86.5 kg (190 lb 11.2 oz)   SpO2 96%   BMI 34.88 kg/m   Physical Exam: General: No acute respiratory distress Lungs: Clear to auscultation bilaterally without wheezes or crackles Cardiovascular: Regular rate and rhythm without murmur gallop or rub normal S1 and S2  Abdomen: Nontender, nondistended, soft, bowel sounds positive, no rebound, no ascites, no appreciable mass Extremities: L heel wound dressed and dry - no signif LE edema or cyanosis   Basic Metabolic Panel:  Recent Labs Lab 04/06/17 1338 04/07/17 0230 04/08/17 0129 04/09/17 0242  NA 124* 134* 134* 135  K 4.1 3.1* 3.6 4.6  CL 83* 101 100* 104  CO2 30 26 28 27   GLUCOSE 647* 159* 295* 283*  BUN 33* 18 16 21*  CREATININE 1.90* 1.42* 1.54* 1.54*  CALCIUM 9.0 8.0* 8.5* 8.9    Liver Function Tests:  Recent Labs Lab 04/07/17 0230 04/08/17 0129  AST 18 18  ALT 15 15  ALKPHOS 92 90  BILITOT 0.7 0.7  PROT 6.1* 6.0*  ALBUMIN 2.4* 2.3*    Coags:  Recent Labs Lab 04/09/17 0242  INR 1.16   CBC:  Recent Labs Lab 04/06/17 1338 04/07/17 0230 04/08/17 0129 04/09/17 0242  WBC 5.1 6.9 5.4 5.0  NEUTROABS 3.5  --   --   --   HGB 10.9* 9.5* 9.6* 10.1*  HCT 32.3* 29.8* 30.2* 32.7*  MCV 79.4 80.1 82.7 83.4  PLT 152 140* 139* 151    CBG:  Recent Labs Lab 04/10/17 1137 04/10/17 1653 04/10/17 2120 04/11/17 0627 04/11/17 1130  GLUCAP 256* 82 116* 151* 137*    Recent Results (from the past 240 hour(s))  Blood Cultures x 2 sites     Status: None (Preliminary  result)   Collection Time: 04/06/17  2:45 PM  Result Value Ref Range Status   Specimen Description BLOOD RIGHT ANTECUBITAL  Final   Special Requests   Final    BOTTLES DRAWN AEROBIC AND ANAEROBIC Blood Culture adequate volume   Culture   Final    NO GROWTH 4 DAYS Performed at [facility], [address] [place], [place] [address]    Report Status PENDING  Incomplete  Blood Cultures x 2 sites     Status: None (Preliminary result)   Collection Time: 04/06/17  2:55 PM  Result Value Ref Range Status   Specimen Description BLOOD RIGHT FOREARM  Final   Special Requests   Final    BOTTLES DRAWN AEROBIC AND ANAEROBIC Blood Culture adequate volume   Culture   Final    NO GROWTH 4 DAYS Performed at [facility], [place] [address], [place], [place] [address]    Report Status PENDING  Incomplete  MRSA PCR Screening     Status: None   Collection Time: 04/06/17  7:04 PM  Result Value Ref Range Status   MRSA by PCR NEGATIVE NEGATIVE Final    Comment:        The GeneXpert MRSA Assay (FDA approved for NASAL specimens only), is one component of a comprehensive MRSA colonization surveillance program. It is not intended to diagnose MRSA infection nor to guide or monitor treatment for MRSA infections.      Time spent in discharge (includes decision making & examination of pt): 35 minutes  04/11/2017, 12:12 PM   Cherene Altes, MD [facility] Office  [phone] Pager [phone]  On-Call/Text Page:      [email or web address]      password [facility]

## 2017-04-11 NOTE — Progress Notes (Signed)
Occupational Therapy Treatment Patient Details Name: [name] [name] MRN: [id number] DOB: 1956-08-08 Today's Date: 04/11/2017    History of present illness Pt is a 61 yo female admitted with L foot pain accompanied by fever and syncope, dx with diabetic foot infection related to chronic L foot ulcer. PMH for uncontrolled DM, HTN, CKD, Nash cirrohsis and fibromyalgia,     OT comments  Pt progressing towards established goals. Pt performed grooming at sink both sitting and standing (depending of RLE fatigue) with supervision and Min VCs to adhere to WB status. Pt plans to dc today. Provided education and answered questions in prepartion for dc today. Continue to recommend dc home with initial 24 hour supervision for safety.    Follow Up Recommendations  Supervision/Assistance - 24 hour    Equipment Recommendations  None recommended by OT    Recommendations for Other Services PT consult    Precautions / Restrictions Precautions Precautions: Fall Restrictions Weight Bearing Restrictions: Yes LLE Weight Bearing: Non weight bearing       Mobility Bed Mobility               General bed mobility comments: in recliner at entry  Transfers Overall transfer level: Needs assistance Equipment used: Rolling walker (2 wheeled) Transfers: Sit to/from Stand Sit to Stand: Supervision         General transfer comment: Pt demonstrates good push up. VCs for hand placement    Balance Overall balance assessment: Needs assistance Sitting-balance support: No upper extremity supported;Feet supported Sitting balance-Leahy Scale: Good Sitting balance - Comments: Able to adjust sock without LOB   Standing balance support: Bilateral upper extremity supported Standing balance-Leahy Scale: Fair Standing balance comment: Able to maintain balance at sink for grooming. Required VCs for WB but able to correct without LOB                           ADL either performed or assessed  with clinical judgement   ADL Overall ADL's : Needs assistance/impaired     Grooming: Set up;Sitting;Supervision/safety;Standing Grooming Details (indicate cue type and reason): Pt performed grooming at sink with set up and supervision. Pt required VCs to adhere to WB precautions. Pt with tendency to place toe down for balance. Pt states "Oh! I didn't even know I was doing it. I have been doing so good. Thank you."               Lower Body Dressing Details (indicate cue type and reason): Reviewed LB dressing techniques. Pt verbalized understanding             Functional mobility during ADLs: Rolling walker;Min guard General ADL Comments: Pt demonstrating good functional performance. Needed VCs for WB but overall performs well.      Vision       Perception     Praxis      Cognition Arousal/Alertness: Awake/alert Behavior During Therapy: WFL for tasks assessed/performed Overall Cognitive Status: Within Functional Limits for tasks assessed                                          Exercises     Shoulder Instructions       General Comments Pt hopeful about medical progress. States her and her husband are looking into hiring a Chartered certified accountant for IADLs    Pertinent Vitals/ Pain  Pain Assessment: Faces Faces Pain Scale: Hurts a little bit Pain Location: L foot Pain Descriptors / Indicators: Pins and needles;Throbbing;Tingling Pain Intervention(s): Monitored during session  Home Living                                          Prior Functioning/Environment              Frequency  Min 2X/week        Progress Toward Goals  OT Goals(current goals can now be found in the care plan section)  Progress towards OT goals: Progressing toward goals  Acute Rehab OT Goals Patient Stated Goal: go home as soon as possible OT Goal Formulation: With patient Time For Goal Achievement: 04/23/17 Potential to Achieve Goals:  Good ADL Goals Pt Will Perform Grooming: with set-up;with supervision;standing Pt Will Perform Lower Body Dressing: with set-up;with supervision;sit to/from stand Pt Will Transfer to Toilet: with set-up;with supervision;bedside commode;ambulating Pt Will Perform Toileting - Clothing Manipulation and hygiene: with set-up;with supervision;sit to/from stand Pt Will Perform Tub/Shower Transfer: Shower transfer;with min guard assist;shower seat;ambulating;rolling walker  Plan Discharge plan remains appropriate    Co-evaluation                 AM-PAC PT "6 Clicks" Daily Activity     Outcome Measure   Help from another person eating meals?: None Help from another person taking care of personal grooming?: A Little Help from another person toileting, which includes using toliet, bedpan, or urinal?: A Little Help from another person bathing (including washing, rinsing, drying)?: A Little Help from another person to put on and taking off regular upper body clothing?: A Little Help from another person to put on and taking off regular lower body clothing?: None 6 Click Score: 20    End of Session Equipment Utilized During Treatment: Rolling walker  OT Visit Diagnosis: Unsteadiness on feet (R26.81);Other abnormalities of gait and mobility (R26.89);Muscle weakness (generalized) (M62.81);Pain Pain - Right/Left: Left Pain - part of body: Ankle and joints of foot   Activity Tolerance Patient tolerated treatment well   Patient Left in chair;with call bell/phone within reach   Nurse Communication Mobility status;Weight bearing status;Precautions        Time: 4136-4383 OT Time Calculation (min): 17 min  Charges: OT General Charges $OT Visit: 1 Procedure OT Treatments $Self Care/Home Management : 8-22 mins  [place], OTR/L Acute Rehab Pager: [phone] Office: [place] 04/11/2017, 1:38 PM

## 2017-04-12 ENCOUNTER — Encounter: Admit: 2017-04-12 | Discharge: 2017-04-12 | Payer: MEDICARE

## 2017-04-12 MED ORDER — FLUCONAZOLE 150 MG PO TAB
150 mg | ORAL_TABLET | Freq: Every day | ORAL | 0 refills | 3.00000 days | Status: AC
Start: 2017-04-12 — End: 2017-08-23

## 2017-04-12 NOTE — Telephone Encounter
Pt calls in leaving a voicemail asking for medication for yeast infection to be sent to her pharmacy.  Says she is having itching really bad. Called pt back to get more information, no answer.  Routing to Dr. Paula Libra.

## 2017-04-12 NOTE — Telephone Encounter
I sent a Rx to her [facility].

## 2017-04-12 NOTE — Telephone Encounter
Left pt a voicemail letting her know Dr. Paula Libra has sent a script for her.

## 2017-04-13 ENCOUNTER — Telehealth: Payer: Self-pay | Admitting: Family Medicine

## 2017-04-13 NOTE — Telephone Encounter (Signed)
ok 

## 2017-04-13 NOTE — Telephone Encounter (Addendum)
Patient scheduled for 04/19/2017 with PCP at 2:30pm, 30 minute slot.

## 2017-04-13 NOTE — Telephone Encounter (Signed)
Please advise 

## 2017-04-13 NOTE — Telephone Encounter (Signed)
Relation to QF:JUVQ Call back number:[phone]   Reason for call:  Patient advise patient would like hospital follow up scheduled in the afternoon after 1pm due to spouse having the car in the AM Monday thru Friday.

## 2017-04-16 ENCOUNTER — Encounter: Admit: 2017-04-16 | Discharge: 2017-04-16 | Payer: MEDICARE

## 2017-04-16 MED ORDER — PANTOPRAZOLE 40 MG PO TBEC
ORAL_TABLET | Freq: Every day | ORAL | 3 refills | 90.00000 days | Status: AC
Start: 2017-04-16 — End: 2017-12-06

## 2017-04-17 ENCOUNTER — Ambulatory Visit (INDEPENDENT_AMBULATORY_CARE_PROVIDER_SITE_OTHER): Payer: BLUE CROSS/BLUE SHIELD | Admitting: Orthopaedic Surgery

## 2017-04-17 ENCOUNTER — Telehealth: Payer: Self-pay | Admitting: Family Medicine

## 2017-04-17 NOTE — Telephone Encounter (Signed)
Requesting:   oxycodone Contract   11/14/2016 UDS   Low risk due on 05/14/2017 Last OV    12/28/2016----next appointment on 04/19/2017 Last Refill   #10 no refills on 04/11/2017  (filled by another provider)  Please Advise

## 2017-04-17 NOTE — Telephone Encounter (Signed)
Caller name: Shaili Relationship to patient: self Can be reached: [phone]  Reason for call: Pt called stating she is out of oxycodone. She had 5 days from her hospital visit. Pt has f/u here 6/21. Pt states she took last pill today.

## 2017-04-17 NOTE — Telephone Encounter (Signed)
database 

## 2017-04-18 ENCOUNTER — Ambulatory Visit (INDEPENDENT_AMBULATORY_CARE_PROVIDER_SITE_OTHER): Payer: BLUE CROSS/BLUE SHIELD | Admitting: Orthopedic Surgery

## 2017-04-18 ENCOUNTER — Encounter (INDEPENDENT_AMBULATORY_CARE_PROVIDER_SITE_OTHER): Payer: Self-pay | Admitting: Orthopedic Surgery

## 2017-04-18 VITALS — Ht 62.0 in | Wt 190.0 lb

## 2017-04-18 DIAGNOSIS — E11621 Type 2 diabetes mellitus with foot ulcer: Secondary | ICD-10-CM | POA: Diagnosis not present

## 2017-04-18 DIAGNOSIS — L97411 Non-pressure chronic ulcer of right heel and midfoot limited to breakdown of skin: Secondary | ICD-10-CM | POA: Diagnosis not present

## 2017-04-18 DIAGNOSIS — L97421 Non-pressure chronic ulcer of left heel and midfoot limited to breakdown of skin: Secondary | ICD-10-CM
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
## 2017-04-19 ENCOUNTER — Ambulatory Visit (INDEPENDENT_AMBULATORY_CARE_PROVIDER_SITE_OTHER): Payer: BLUE CROSS/BLUE SHIELD | Admitting: Family Medicine

## 2017-04-19 ENCOUNTER — Encounter: Payer: Self-pay | Admitting: Family Medicine

## 2017-04-19 VITALS — BP 100/56 | HR 87 | Temp 98.4°F | Resp 16

## 2017-04-19 DIAGNOSIS — E785 Hyperlipidemia, unspecified: Secondary | ICD-10-CM | POA: Diagnosis not present

## 2017-04-19 DIAGNOSIS — IMO0002 Reserved for concepts with insufficient information to code with codable children: Secondary | ICD-10-CM

## 2017-04-19 DIAGNOSIS — L97509 Non-pressure chronic ulcer of other part of unspecified foot with unspecified severity: Secondary | ICD-10-CM

## 2017-04-19 DIAGNOSIS — Z794 Long term (current) use of insulin: Secondary | ICD-10-CM | POA: Diagnosis not present

## 2017-04-19 DIAGNOSIS — E11621 Type 2 diabetes mellitus with foot ulcer: Secondary | ICD-10-CM | POA: Diagnosis not present

## 2017-04-19 DIAGNOSIS — E1165 Type 2 diabetes mellitus with hyperglycemia: Secondary | ICD-10-CM

## 2017-04-19 DIAGNOSIS — L03032 Cellulitis of left toe: Secondary | ICD-10-CM | POA: Diagnosis not present

## 2017-04-19 DIAGNOSIS — L02612 Cutaneous abscess of left foot: Secondary | ICD-10-CM

## 2017-04-19 DIAGNOSIS — L02619 Cutaneous abscess of unspecified foot: Secondary | ICD-10-CM

## 2017-04-19 DIAGNOSIS — L03119 Cellulitis of unspecified part of limb: Secondary | ICD-10-CM

## 2017-04-19 MED ORDER — HYDROCODONE-ACETAMINOPHEN 5-325 MG PO TABS: 1.0000 | ORAL_TABLET | ORAL | 0 refills | Status: DC | PRN

## 2017-04-19 MED ORDER — INSULIN ASPART 100 UNIT/ML FLEXPEN
PEN_INJECTOR | SUBCUTANEOUS | 11 refills | Status: DC
Start: 2017-04-19 — End: 2017-09-24

## 2017-04-19 MED ORDER — INSULIN GLARGINE 100 UNIT/ML SOLOSTAR PEN
PEN_INJECTOR | SUBCUTANEOUS | 0 refills | Status: DC
Start: 2017-04-19 — End: 2017-06-04
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
## 2017-04-19 NOTE — Patient Instructions (Signed)
Stress and Stress Management Stress is a normal reaction to life events. It is what you feel when life demands more than you are used to or more than you can handle. Some stress can be useful. For example, the stress reaction can help you catch the last bus of the day, study for a test, or meet a deadline at work. But stress that occurs too often or for too long can cause problems. It can affect your emotional health and interfere with relationships and normal daily activities. Too much stress can weaken your immune system and increase your risk for physical illness. If you already have a medical problem, stress can make it worse. What are the causes? All sorts of life events may cause stress. An event that causes stress for one person may not be stressful for another person. Major life events commonly cause stress. These may be positive or negative. Examples include losing your job, moving into a new home, getting married, having a baby, or losing a loved one. Less obvious life events may also cause stress, especially if they occur day after day or in combination. Examples include working long hours, driving in traffic, caring for children, being in debt, or being in a difficult relationship. What are the signs or symptoms? Stress may cause emotional symptoms including, the following:  Anxiety. This is feeling worried, afraid, on edge, overwhelmed, or out of control.  Anger. This is feeling irritated or impatient.  Depression. This is feeling sad, down, helpless, or guilty.  Difficulty focusing, remembering, or making decisions.  Stress may cause physical symptoms, including the following:  Aches and pains. These may affect your head, neck, back, stomach, or other areas of your body.  Tight muscles or clenched jaw.  Low energy or trouble sleeping.  Stress may cause unhealthy behaviors, including the following:  Eating to feel better (overeating) or skipping meals.  Sleeping too little,  too much, or both.  Working too much or putting off tasks (procrastination).  Smoking, drinking alcohol, or using drugs to feel better.  How is this diagnosed? Stress is diagnosed through an assessment by your health care provider. Your health care provider will ask questions about your symptoms and any stressful life events.Your health care provider will also ask about your medical history and may order blood tests or other tests. Certain medical conditions and medicine can cause physical symptoms similar to stress. Mental illness can cause emotional symptoms and unhealthy behaviors similar to stress. Your health care provider may refer you to a mental health professional for further evaluation. How is this treated? Stress management is the recommended treatment for stress.The goals of stress management are reducing stressful life events and coping with stress in healthy ways. Techniques for reducing stressful life events include the following:  Stress identification. Self-monitor for stress and identify what causes stress for you. These skills may help you to avoid some stressful events.  Time management. Set your priorities, keep a calendar of events, and learn to say "no." These tools can help you avoid making too many commitments.  Techniques for coping with stress include the following:  Rethinking the problem. Try to think realistically about stressful events rather than ignoring them or overreacting. Try to find the positives in a stressful situation rather than focusing on the negatives.  Exercise. Physical exercise can release both physical and emotional tension. The key is to find a form of exercise you enjoy and do it regularly.  Relaxation techniques. These relax the body and  mind. Examples include yoga, meditation, tai chi, biofeedback, deep breathing, progressive muscle relaxation, listening to music, being out in nature, journaling, and other hobbies. Again, the key is to find  one or more that you enjoy and can do regularly.  Healthy lifestyle. Eat a balanced diet, get plenty of sleep, and do not smoke. Avoid using alcohol or drugs to relax.  Strong support network. Spend time with family, friends, or other people you enjoy being around.Express your feelings and talk things over with someone you trust.  Counseling or talktherapy with a mental health professional may be helpful if you are having difficulty managing stress on your own. Medicine is typically not recommended for the treatment of stress.Talk to your health care provider if you think you need medicine for symptoms of stress. Follow these instructions at home:  Keep all follow-up visits as directed by your health care provider.  Take all medicines as directed by your health care provider. Contact a health care provider if:  Your symptoms get worse or you start having new symptoms.  You feel overwhelmed by your problems and can no longer manage them on your own. Get help right away if:  You feel like hurting yourself or someone else. This information is not intended to replace advice given to you by your health care provider. Make sure you discuss any questions you have with your health care provider. Document Released: 04/11/2001 Document Revised: 03/23/2016 Document Reviewed: 06/10/2013 Elsevier Interactive Patient Education  2017 Elsevier Inc.  

## 2017-04-20 LAB — CBC WITH DIFFERENTIAL/PLATELET
Basophils Absolute: 0.1 10*3/uL (ref 0.0–0.1)
Basophils Relative: 1 % (ref 0.0–3.0)
EOS ABS: 0.1 10*3/uL (ref 0.0–0.7)
Eosinophils Relative: 1.2 % (ref 0.0–5.0)
HCT: 33.9 % — ABNORMAL LOW (ref 36.0–46.0)
Hemoglobin: 10.9 g/dL — ABNORMAL LOW (ref 12.0–15.0)
LYMPHS ABS: 1.8 10*3/uL (ref 0.7–4.0)
Lymphocytes Relative: 24 % (ref 12.0–46.0)
MCHC: 32.2 g/dL (ref 30.0–36.0)
MCV: 82.1 fl (ref 78.0–100.0)
MONO ABS: 0.6 10*3/uL (ref 0.1–1.0)
Monocytes Relative: 8.6 % (ref 3.0–12.0)
NEUTROS ABS: 4.9 10*3/uL (ref 1.4–7.7)
Neutrophils Relative %: 65.2 % (ref 43.0–77.0)
PLATELETS: 176 10*3/uL (ref 150.0–400.0)
RBC: 4.13 Mil/uL (ref 3.87–5.11)
RDW: 16.2 % — AB (ref 11.5–15.5)
WBC: 7.5 10*3/uL (ref 4.0–10.5)

## 2017-04-20 LAB — COMPREHENSIVE METABOLIC PANEL
ALBUMIN: 3.7 g/dL (ref 3.5–5.2)
ALT: 13 U/L (ref 0–35)
AST: 19 U/L (ref 0–37)
Alkaline Phosphatase: 58 U/L (ref 39–117)
BUN: 23 mg/dL (ref 6–23)
CO2: 27 meq/L (ref 19–32)
CREATININE: 1.65 mg/dL — AB (ref 0.40–1.20)
Calcium: 9.4 mg/dL (ref 8.4–10.5)
Chloride: 107 mEq/L (ref 96–112)
GFR: 33.58 mL/min — ABNORMAL LOW (ref >60.00)
Glucose, Bld: 96 mg/dL (ref 70–99)
Potassium: 3.8 mEq/L (ref 3.5–5.1)
SODIUM: 141 meq/L (ref 135–145)
Total Bilirubin: 0.5 mg/dL (ref 0.2–1.2)
Total Protein: 7.1 g/dL (ref 6.0–8.3)

## 2017-04-20 LAB — LIPID PANEL
CHOLESTEROL: 96 mg/dL (ref 0–200)
HDL: 40.9 mg/dL (ref >39.00)
LDL Cholesterol: 39 mg/dL (ref 0–99)
NonHDL: 55.37
TRIGLYCERIDES: 80 mg/dL (ref 0.0–149.0)
Total CHOL/HDL Ratio: 2
VLDL: 16 mg/dL (ref 0.0–40.0)

## 2017-04-20 NOTE — Telephone Encounter (Signed)
We filled hydrocodone yesterday-- she said the oxy did not work--- we cant give both and we can only give few days  She will need to get future refills from ortho

## 2017-04-20 NOTE — Telephone Encounter (Signed)
Printed, placed on ledge.

## 2017-04-24 ENCOUNTER — Other Ambulatory Visit: Payer: Self-pay | Admitting: *Deleted

## 2017-04-24 DIAGNOSIS — R7989 Other specified abnormal findings of blood chemistry: Secondary | ICD-10-CM

## 2017-04-24 NOTE — Progress Notes (Signed)
a 

## 2017-04-25 ENCOUNTER — Telehealth: Payer: Self-pay | Admitting: Family Medicine

## 2017-04-25 DIAGNOSIS — M797 Fibromyalgia: Secondary | ICD-10-CM

## 2017-04-25 NOTE — Telephone Encounter (Signed)
Relation to OY:DXAJ Call back number:[phone]   Reason for call:  Patient requesting a refill HYDROcodone-acetaminophen (NORCO/VICODIN) 5-325 MG tablet and in need of clinical advice regarding how much "zinc" she should be taking,please advise.

## 2017-04-25 NOTE — Telephone Encounter (Signed)
Caller name: Magda Paganini  Relation to pt: RN from [facility] Call back number: [phone]    Reason for call:  Verbal orders for skilled nursing 3x 8 3 prn as needed, wound care orders, requesting orders for levofloxacin (LEVAQUIN) 750 MG tablet and insulin orders and requesting medication list please fax to [phone]

## 2017-04-26 ENCOUNTER — Ambulatory Visit (INDEPENDENT_AMBULATORY_CARE_PROVIDER_SITE_OTHER): Payer: BLUE CROSS/BLUE SHIELD | Admitting: Orthopedic Surgery

## 2017-04-26 ENCOUNTER — Encounter: Payer: Self-pay | Admitting: Internal Medicine

## 2017-04-26 DIAGNOSIS — Z794 Long term (current) use of insulin: Secondary | ICD-10-CM | POA: Diagnosis not present

## 2017-04-26 DIAGNOSIS — L97421 Non-pressure chronic ulcer of left heel and midfoot limited to breakdown of skin: Secondary | ICD-10-CM

## 2017-04-26 DIAGNOSIS — E1165 Type 2 diabetes mellitus with hyperglycemia: Secondary | ICD-10-CM | POA: Diagnosis not present
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
## 2017-04-27 ENCOUNTER — Telehealth (INDEPENDENT_AMBULATORY_CARE_PROVIDER_SITE_OTHER): Payer: Self-pay

## 2017-04-27 NOTE — Telephone Encounter (Signed)
Patient called again today and I asked her about the medication again.  She states that she likes the med that was given to her for the hydrocodone and she does not need any at this time and will call us when she needs a refill.  Advised her that per Dr. Etter Sjogren she is suppose to be in a pain clinic.  She stated that she did not go to the [place] pain clinic because they said they would take her license.  She will try them again.  Referral placed.

## 2017-04-27 NOTE — Telephone Encounter (Signed)
Magda Paganini will call Dr. Sharol Given for wound care orders.  Med list faxed to her.

## 2017-04-27 NOTE — Telephone Encounter (Signed)
Spoke with patient yesterday and she stated that she wants oxycodone instead of what she got.  She stated that the hydrocodone is the wrong medication.  Advised per Dr. Etter Sjogren that she should be getting that from Dr. Sharol Given and she stated that Dr. Etter Sjogren has always filled her medication because she has fibromyalgia and chronic pain.  Per Dr. Etter Sjogren she will fill the oxycodone but if she has not taken the hydrocodone then she must bring in pills.  Will check database.

## 2017-04-27 NOTE — Telephone Encounter (Signed)
Patient is requesting call back [phone]

## 2017-04-27 NOTE — Telephone Encounter (Signed)
[name] [name] with [facility] would like orders for skilled nursing for once a day for 5 days and 3 x week for 7 weeks and 3 PRN's. Needs clarification on wound care instructions and would like to know what to apply for wounds? Would like to know if Dr. Sharol Given would like blood work for CBC and BMP.  CB# [phone].

## 2017-04-30 ENCOUNTER — Other Ambulatory Visit: Payer: Self-pay | Admitting: Family Medicine

## 2017-04-30 ENCOUNTER — Other Ambulatory Visit (INDEPENDENT_AMBULATORY_CARE_PROVIDER_SITE_OTHER): Payer: Self-pay

## 2017-04-30 MED ORDER — HYDROCODONE-ACETAMINOPHEN 5-325 MG PO TABS: ORAL_TABLET | ORAL | 0 refills | Status: DC

## 2017-04-30 NOTE — Telephone Encounter (Signed)
rx printed and up front for pickup.

## 2017-04-30 NOTE — Telephone Encounter (Signed)
Spoke with patient again.  She stated that she has been on oxy for a while.  I explained to her about the conversation that her and Dr. Etter Sjogren had and the conversation that we had.  Per Dr. Etter Sjogren patient stated that oxy was not working and so Dr. Etter Sjogren wrote for hydrocodone.  The conversation with the patient and I on last week she stated that she was doing well on the hydrocodone and it worked for her.  She agreed to the hydrocodone.

## 2017-04-30 NOTE — Telephone Encounter (Signed)
I can refill once the med currently in her chart the Hydrocodone APAP with same sig #30 til pcp returns but cannot reinstitute a discontinued med while pcp out of town

## 2017-04-30 NOTE — Telephone Encounter (Signed)
What wound care orders do you want home health to provide pt? Any reason to have CBC and BNP obtained?

## 2017-04-30 NOTE — Telephone Encounter (Signed)
Patient request should be for hydrocodone and not oxycodone.  Hydrocodone  Last filled per database:  04/19/17 Last written: 04/19/17 Last ov: 04/19/17 Next ov: nothing scheduled  Contract: 11/14/16 UDS: due 05/14/17

## 2017-04-30 NOTE — Telephone Encounter (Signed)
Refill on oxycodone. Pt says that she and the nurse has already discussed refill last week she was advised to call in today to request.    CB: [phone]

## 2017-04-30 NOTE — Telephone Encounter (Signed)
Order written and faxed to [phone]

## 2017-04-30 NOTE — Telephone Encounter (Signed)
Pt's spouse Marguerite Olea came in office stating pt is needing oxycodone and NOT hydrocodone. Pt is completely out of her meds and is needing rx ASAP. Please advise.

## 2017-05-01 ENCOUNTER — Other Ambulatory Visit: Payer: Self-pay | Admitting: Family Medicine

## 2017-05-01 MED ORDER — PREGABALIN 150 MG PO CAPS: 150.0000 mg | ORAL_CAPSULE | Freq: Three times a day (TID) | ORAL | 0 refills | Status: DC | PRN

## 2017-05-01 NOTE — Telephone Encounter (Signed)
Rx printed, awaiting MD signature.  

## 2017-05-01 NOTE — Telephone Encounter (Signed)
Pharmacy called in to follow up on refill request on Lyrica. She said that they received a denial stating that it is to soon to fill. She's not sure why. She would like to be advised further.

## 2017-05-01 NOTE — Telephone Encounter (Signed)
Advise on this refill

## 2017-05-01 NOTE — Telephone Encounter (Signed)
Rx faxed to [facility].

## 2017-05-01 NOTE — Telephone Encounter (Signed)
She is on long lyrica,  okay to refill one month

## 2017-05-03 ENCOUNTER — Other Ambulatory Visit: Payer: Self-pay | Admitting: Family Medicine

## 2017-05-04 ENCOUNTER — Telehealth: Payer: Self-pay | Admitting: Internal Medicine

## 2017-05-05 NOTE — Telephone Encounter (Signed)
She saw Dr Sharol Given June 28 (8 days ago) for small foot ulcer. I didn't see that in his note, but ok. I wish that she would have cancelled earlier!

## 2017-05-07 ENCOUNTER — Encounter: Payer: BLUE CROSS/BLUE SHIELD | Admitting: Internal Medicine

## 2017-05-08 ENCOUNTER — Telehealth: Payer: Self-pay | Admitting: *Deleted

## 2017-05-08 ENCOUNTER — Telehealth: Payer: Self-pay | Admitting: Family Medicine

## 2017-05-08 ENCOUNTER — Encounter (INDEPENDENT_AMBULATORY_CARE_PROVIDER_SITE_OTHER): Payer: Self-pay | Admitting: Orthopedic Surgery

## 2017-05-08 ENCOUNTER — Ambulatory Visit (INDEPENDENT_AMBULATORY_CARE_PROVIDER_SITE_OTHER): Payer: BLUE CROSS/BLUE SHIELD | Admitting: Orthopedic Surgery

## 2017-05-08 DIAGNOSIS — L97421 Non-pressure chronic ulcer of left heel and midfoot limited to breakdown of skin: Secondary | ICD-10-CM

## 2017-05-08 MED ORDER — HYDROCODONE-ACETAMINOPHEN 5-325 MG PO TABS: ORAL_TABLET | ORAL | 0 refills | Status: DC

## 2017-05-08 NOTE — Telephone Encounter (Signed)
She saw Dr. Sharol Given today and he stated her foot is healing, it may take close to a year.   She stated PCP already referred her to pain management, but the [facility] office closed/and are sending the referral now to [facility] and will be about 6 weeks before she is seen. Did inform her to pickup hardcopy  At the front desk.and informed of PCP instructions regarding further refills. She did verbalized understanding/

## 2017-05-08 NOTE — Telephone Encounter (Signed)
Received Home Health Certification and Plan of Care; forwarded to provider with SAS envelope attached/SLS 07/10

## 2017-05-08 NOTE — Telephone Encounter (Signed)
Has any decision been made about her foot?   We will refill but if it is going to be long term we will need to refer to pain management.

## 2017-05-08 NOTE — Telephone Encounter (Signed)
Self.   Refill request HYDROcodone-acetaminophen.    Please call pt when ready for pick up.
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
## 2017-05-08 NOTE — Telephone Encounter (Signed)
Patient picked up and filled this on 04/30/2017 #30  Data base checked by [place] print out on your desk!

## 2017-05-09 NOTE — Telephone Encounter (Signed)
Plan of care signed and mailed back to [facility] in envelope provided. Copy of forms sent for scanning.

## 2017-05-11 ENCOUNTER — Telehealth: Payer: Self-pay | Admitting: *Deleted

## 2017-05-11 NOTE — Telephone Encounter (Signed)
Received request for Medical Records from [place]., forwarded to [place] for email/scana/SLS 07/13

## 2017-05-15 ENCOUNTER — Telehealth: Payer: Self-pay | Admitting: Family Medicine

## 2017-05-15 MED ORDER — HYDROCODONE-ACETAMINOPHEN 5-325 MG PO TABS: ORAL_TABLET | ORAL | 0 refills | Status: DC

## 2017-05-15 NOTE — Telephone Encounter (Signed)
Relation to XF:FKVQ Call back number:[phone]   Reason for call:  Patient requesting a refill HYDROcodone-acetaminophen (NORCO/VICODIN) 5-325 MG tablet

## 2017-05-15 NOTE — Telephone Encounter (Signed)
Printed hardcopy/patient notified to pickup at our office at her convenience. Foot is doing "wonderful"!!.

## 2017-05-15 NOTE — Telephone Encounter (Signed)
Too soon-- she just had it filled on 10th

## 2017-05-15 NOTE — Telephone Encounter (Signed)
Requesting:  hydrocodone Contract    11/14/16 UDS    Low risk on 05/14/17 Last OV    04/19/2017 Last Refill    #30 on 05/08/2017  Please Advise

## 2017-05-15 NOTE — Telephone Encounter (Signed)
Patient informed refill denied as too early  She stated she has 4 pills left and has been taking 1 every 4 hours for her pain.

## 2017-05-15 NOTE — Telephone Encounter (Signed)
Refill #60 Is the foot looking any better

## 2017-05-16 ENCOUNTER — Telehealth: Payer: Self-pay | Admitting: Family Medicine

## 2017-05-16 NOTE — Telephone Encounter (Signed)
Error

## 2017-05-17 ENCOUNTER — Ambulatory Visit (INDEPENDENT_AMBULATORY_CARE_PROVIDER_SITE_OTHER): Payer: BLUE CROSS/BLUE SHIELD | Admitting: Family Medicine

## 2017-05-17 ENCOUNTER — Encounter: Payer: Self-pay | Admitting: Family Medicine

## 2017-05-17 DIAGNOSIS — M25512 Pain in left shoulder: Secondary | ICD-10-CM | POA: Diagnosis not present

## 2017-05-17 DIAGNOSIS — E1165 Type 2 diabetes mellitus with hyperglycemia: Secondary | ICD-10-CM | POA: Diagnosis not present

## 2017-05-17 DIAGNOSIS — Z794 Long term (current) use of insulin: Secondary | ICD-10-CM

## 2017-05-17 DIAGNOSIS — L97421 Non-pressure chronic ulcer of left heel and midfoot limited to breakdown of skin: Secondary | ICD-10-CM

## 2017-05-17 NOTE — Patient Instructions (Signed)
Shoulder Pain Many things can cause shoulder pain, including:  An injury to the area.  Overuse of the shoulder.  Arthritis.  The source of the pain can be:  Inflammation.  An injury to the shoulder joint.  An injury to a tendon, ligament, or bone.  Follow these instructions at home: Take these actions to help with your pain:  Squeeze a soft ball or a foam pad as much as possible. This helps to keep the shoulder from swelling. It also helps to strengthen the arm.  Take over-the-counter and prescription medicines only as told by your health care provider.  If directed, apply ice to the area: ? Put ice in a plastic bag. ? Place a towel between your skin and the bag. ? Leave the ice on for 20 minutes, 2-3 times per day. Stop applying ice if it does not help with the pain.  If you were given a shoulder sling or immobilizer: ? Wear it as told. ? Remove it to shower or bathe. ? Move your arm as little as possible, but keep your hand moving to prevent swelling.  Contact a health care provider if:  Your pain gets worse.  Your pain is not relieved with medicines.  New pain develops in your arm, hand, or fingers. Get help right away if:  Your arm, hand, or fingers: ? Tingle. ? Become numb. ? Become swollen. ? Become painful. ? Turn white or blue. This information is not intended to replace advice given to you by your health care provider. Make sure you discuss any questions you have with your health care provider. Document Released: 07/26/2005 Document Revised: 06/11/2016 Document Reviewed: 02/08/2015 Elsevier Interactive Patient Education  2017 [employer].

## 2017-05-17 NOTE — Assessment & Plan Note (Signed)
[facility] ortho/ [facility]

## 2017-05-17 NOTE — Progress Notes (Signed)
[name], female    DOB: 06-Jun-1956  Age: 61 y.o. MRN: [id number]    Subjective:  Subjective  HPI [name] [name] presents for a note that her dog needs to be with her due to her anxiety.  She is moving to an apartment so she can take a bath in her house.  Pt is seeing Dr Sharol Given and a dr at [facility] for corrective surgery.     Pt c/o L shoulder pain x 3 months.  She has a hx of fracture many years ago but recently has started hurting and is worse everyday.     Review of Systems  Constitutional: Negative for fever.  HENT: Negative for congestion.   Respiratory: Negative for shortness of breath.   Cardiovascular: Negative for chest pain, palpitations and leg swelling.  Gastrointestinal: Negative for abdominal pain, blood in stool and nausea.  Genitourinary: Negative for dysuria and frequency.  Skin: Negative for rash.  Allergic/Immunologic: Negative for environmental allergies.  Neurological: Negative for dizziness and headaches.  Psychiatric/Behavioral: The patient is not nervous/anxious.     History Past Medical History:  Diagnosis Date  . Acute MI [facility]) 2007   presented to ED & had cardiac cath- but found to have normal coronaries. Since that point in time her PCP cares f or cardiac needs. Dr. [name] - [facility]  . Anemia   . Anginal pain ([place])   . Anxiety   . Asthma   . Bulging lumbar disc   . Cataract   . 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 Cirrhosis of liver without mention of alcohol   . Constipation   . Dehiscence of closure of skin    left partial calcaneal excision  . Depression   . Diabetes mellitus    insulin dependent, adult onset  . Episode of visual loss of left eye   . Exertional shortness of breath   . Fatty liver   . Fibromyalgia   . GERD (gastroesophageal reflux disease)   . Hepatic steatosis   . High cholesterol   . Hypertension   . MRSA (methicillin resistant Staphylococcus  aureus)   . Neuropathy    lower legs  . Osteoarthritis    hands, hips  . Proximal humerus fracture 10/15/12   Left  . PTSD (post-traumatic stress disorder)   . 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   Her family history includes Cancer in her father; Colitis in her father; Crohn's disease in her father; Diabetes in her brother, father, and mother; Heart disease in her father; Heart disease (age of onset: 5) in her brother; Hypertension in her mother; Irritable bowel syndrome in her daughter; Kidney disease in her brother; Kidney failure in her brother; Leukemia in her father; Mental illness in her mother.She reports that she has never smoked. She has never used smokeless tobacco. She reports that she does not drink alcohol or use drugs.  Current Outpatient Prescriptions on File Prior to Visit  Medication Sig Dispense Refill  . atorvastatin (LIPITOR) 10 MG tablet  TAKE ONE TABLET BY MOUTH ONCE DAILY 60 tablet 2  . Calcium Carbonate-Vitamin D (CALTRATE 600+D) 600-400 MG-UNIT per tablet Take 1 tablet by mouth daily.     . Cholecalciferol (VITAMIN D3) 5000 UNITS CAPS Take 1 capsule by mouth 2 (two) times daily.    [name] Kitchen CINNAMON PO Take 1,000 mg by mouth daily.    . collagenase (SANTYL) ointment Apply topically daily. 15 g 0  . ENSURE PLUS (ENSURE PLUS) LIQD Take 237 mLs by mouth daily.     . fenofibrate 160 MG tablet Take 1 tablet (160 mg total) by mouth daily. 30 tablet 5  . ferrous  sulfate (SLOW FE) 160 (50 FE) MG TBCR SR tablet Take 160 mg by mouth daily.    . Flaxseed, Linseed, (FLAX SEED OIL) 1000 MG CAPS Take 1 capsule by mouth at bedtime.    [name] Kitchen HYDROcodone-acetaminophen (NORCO/VICODIN) 5-325 MG tablet Take 1 to 2 tablets by mouth every 4 hours as needed 60 tablet 0  . insulin aspart (NOVOLOG FLEXPEN) 100 UNIT/ML FlexPen 13 u sq tid 15 mL 11  . Insulin Glargine (LANTUS SOLOSTAR) 100 UNIT/ML Solostar Pen Inject 18 units each morning, and 40 units QHS 15 pen 0  . levofloxacin (LEVAQUIN) 750 MG tablet Take 1 tablet (750 mg total) by mouth every other day. 30 tablet 0  . Multiple Vitamin (MULTIVITAMIN) tablet Take 1 tablet by mouth daily.     . ondansetron (ZOFRAN ODT) 8 MG disintegrating tablet Take 1 tablet (8 mg total) by mouth every 8 (eight) hours as needed for nausea or vomiting. 20 tablet 0  . polyethylene glycol (MIRALAX / GLYCOLAX) packet Take 17 g by mouth daily.      . pregabalin (LYRICA) 150 MG capsule Take 1 capsule (150 mg total) by mouth 3 (three) times daily as needed. 90 capsule 0  . vitamin C (ASCORBIC ACID) 500 MG tablet Take 500 mg by mouth daily.    . [DISCONTINUED] metFORMIN (GLUCOPHAGE) 1000 MG tablet Take 1,000 mg by mouth 2 (two) times daily with a meal.      . [DISCONTINUED] omeprazole (PRILOSEC) 20 MG capsule Take 20 mg by mouth daily.       No current facility-administered medications on file prior to visit.      Objective:  Objective  Physical Exam  Constitutional: She is oriented to person, place, and time. She appears well-developed and well-nourished.  HENT:  Head: Normocephalic and atraumatic.  Eyes: Conjunctivae and EOM are normal.  Neck: Normal range of motion. Neck supple. No JVD present. Carotid bruit is not present. No thyromegaly present.  Cardiovascular: Normal rate, regular rhythm and normal heart sounds.   No murmur heard. Pulmonary/Chest: Effort normal and breath sounds normal. No respiratory distress. She has no wheezes. She  has no rales. She exhibits no tenderness.  Musculoskeletal: She exhibits no edema.       Left shoulder: She exhibits decreased range of motion and tenderness.       Arms:      Feet:  Neurological: She is alert and oriented to person, place, and time.  Psychiatric: She has a normal mood and affect. Her behavior is normal. Judgment and thought content normal.  Nursing note and vitals reviewed.  BP 130/78 (BP Location: Left Arm, Patient Position: Sitting, Cuff Size: Normal)   Pulse 76   Temp 98.9 F (37.2 C) (Oral)   Ht 5' 2"  (1.575 m)   Wt 164 lb (74.4 kg)   SpO2 97%   BMI 30.00  kg/m  Wt Readings from Last 3 Encounters:  05/17/17 164 lb (74.4 kg)  04/18/17 190 lb (86.2 kg)  04/06/17 190 lb 11.2 oz (86.5 kg)     Lab Results  Component Value Date   WBC 7.5 04/19/2017   HGB 10.9 (L) 04/19/2017   HCT 33.9 (L) 04/19/2017   PLT 176.0 04/19/2017   GLUCOSE 96 04/19/2017   CHOL 96 04/19/2017   TRIG 80.0 04/19/2017   HDL 40.90 04/19/2017   LDLDIRECT 115.8 01/23/2012   LDLCALC 39 04/19/2017   ALT 13 04/19/2017   AST 19 04/19/2017   NA 141 04/19/2017   K 3.8 04/19/2017   CL 107 04/19/2017   CREATININE 1.65 (H) 04/19/2017   BUN 23 04/19/2017   CO2 27 04/19/2017   TSH 1.65 01/03/2016   INR 1.16 04/09/2017   HGBA1C 15.3 (H) 04/07/2017   MICROALBUR 3.1 (H) 08/12/2015    Ct Head Wo Contrast  Result Date: 04/06/2017 CLINICAL DATA:  Dizziness and multiple falls. EXAM: CT HEAD WITHOUT CONTRAST TECHNIQUE: Contiguous axial images were obtained from the base of the skull through the vertex without intravenous contrast. COMPARISON:  Head CT 03/26/2015 FINDINGS: Brain: No mass lesion, intraparenchymal hemorrhage or extra-axial collection. No evidence of acute cortical infarct. Unchanged posterior fossa arachnoid cyst. Vascular: No hyperdense vessel or unexpected calcification. Skull: Normal visualized skull base, calvarium and extracranial soft tissues. Sinuses/Orbits: No sinus fluid levels  or advanced mucosal thickening. No mastoid effusion. Normal orbits. IMPRESSION: No acute intracranial abnormality. Unchanged arachnoid cyst and otherwise normal brain. Electronically Signed   By: Ulyses Jarred M.D.   On: 04/06/2017 14:56   Mr Foot Left Wo Contrast  Result Date: 04/06/2017 CLINICAL DATA:  Insulin-dependent diabetic with chronic left heel ulcer, status post partial calcaneal excision last December for osteomyelitis. EXAM: MRI OF THE LEFT FOOT WITHOUT CONTRAST TECHNIQUE: Multiplanar, multisequence MR imaging of the left heel was performed. No intravenous contrast was administered. COMPARISON:  None. FINDINGS: Limited assessment due to patient motion artifacts and lack of IV contrast. Bones/Joint/Cartilage Partial resection of the dorsal and plantar aspect of the calcaneus with small residual posterior calcaneal fragment receiving the plantar fascia and Achilles tendon noted within the adjacent soft tissues. Ankle mortise is maintained. The distal tibia and fibula are unremarkable as is the talus. The subtalar joint appears intact. Reactive marrow edema of the midfoot articulations with subtle linear hypointense signal within the medial aspect of the tarsal navicular surrounded by edema, on series 3 image 13 and series 4, image 14 likely reflects a nondisplaced navicular fracture. No conclusive findings for recurrence of osteomyelitis. Ligaments Intact anterior, intraosseous and posterior tibiofibular ligaments along the lateral aspect. Intact deltoid ligament. Muscles and Tendons Diffuse generalized intramuscular atrophy and edema. Soft tissues Diffuse soft tissue edema of the visualized ankle and foot with soft tissue ulceration along the lateral aspect of the hindfoot. No abnormal fluid collections are apparent. No definite abscess though study is limited without IV contrast. IMPRESSION: 1. Partial resection of the plantar dorsal aspect of the posterior calcaneus with small residual calcaneal  fragment within the soft tissues receiving the plantar fascia and Achilles tendon. No recurrence of osteomyelitis is apparent. Postop changes along the surgical margin noted. 2. Soft tissue ulceration without soft tissue abscess along the lateral aspect of the hindfoot. Diffuse subcutaneous and intramuscular edema with generalized muscle atrophy. 3. Nondisplaced appearing navicular fracture with surrounding edema. Electronically Signed   By: Ashley Royalty M.D.   On: 04/06/2017 23:07   Ap /  Lateral X-ray Left Foot  Result Date: 04/06/2017 CLINICAL DATA:  Infection EXAM: LEFT FOOT - 2 VIEW COMPARISON:  February 02, 2016 FINDINGS: Frontal and lateral views were obtained. The patient has had a portion of the calcaneus removed. There is a separated bony fragment posterior to remaining calcaneus. There is extensive subcutaneous air posterior and volar to the calcaneus. Areas seen in the soft tissues laterally in the calcaneal region. There is extensive soft tissue swelling in the hindfoot region. More proximally, there is flexion of all PIP and DIP joints. No fracture or dislocation. No erosive change or bony destruction evident. Joint spaces appear unremarkable. IMPRESSION: Marked soft tissue swelling in the hindfoot region with extensive subcutaneous region air. Suspect infection with potential hindfoot abscess. Postoperative change involving the calcaneus with bony fragment posterior to the remaining calcaneus which appears well corticated. Much of the posterior calcaneus has been removed. Elsewhere there are flexion deformities of all PIP and DIP joints. No appreciable joint space narrowing or erosion. No acute fracture or dislocation. From an imaging standpoint, MR pre and post-contrast would be the imaging study of choice to optimize assessment for extent of potential abscess. Electronically Signed   By: Lowella Grip III M.D.   On: 04/06/2017 14:57     Assessment & Plan:  Plan  I am having Ms. Emberton maintain  her Calcium Carbonate-Vitamin D, multivitamin, polyethylene glycol, CINNAMON PO, Vitamin D3, Flax Seed Oil, ENSURE PLUS, vitamin C, ferrous sulfate, fenofibrate, atorvastatin, ondansetron, collagenase, levofloxacin, Insulin Glargine, insulin aspart, pregabalin, and HYDROcodone-acetaminophen.  No orders of the defined types were placed in this encounter.   Problem List Items Addressed This Visit      Unprioritized   Left shoulder pain   Relevant Orders   Ambulatory referral to Sports Medicine   Type 2 diabetes mellitus with hyperglycemia, with long-term current use of insulin ([place])    Per endo      Ulcer of left heel, limited to breakdown of skin ([place])    Per ortho/ [facility]         Follow-up: Return if symptoms worsen or fail to improve.  Ann Held, DO

## 2017-05-17 NOTE — Progress Notes (Signed)
Pre visit review using our clinic review tool, if applicable. No additional management support is needed unless otherwise documented below in the visit note. 

## 2017-05-17 NOTE — Assessment & Plan Note (Signed)
Per endo °

## 2017-05-18 ENCOUNTER — Other Ambulatory Visit: Payer: Self-pay | Admitting: Family Medicine

## 2017-05-23 ENCOUNTER — Ambulatory Visit (INDEPENDENT_AMBULATORY_CARE_PROVIDER_SITE_OTHER): Payer: BLUE CROSS/BLUE SHIELD | Admitting: Family Medicine

## 2017-05-23 VITALS — BP 112/69 | Ht 62.0 in | Wt 182.0 lb

## 2017-05-23 DIAGNOSIS — M25512 Pain in left shoulder: Secondary | ICD-10-CM

## 2017-05-23 MED ORDER — NAPROXEN 500 MG PO TABS: 500.0000 mg | ORAL_TABLET | Freq: Two times a day (BID) | ORAL | 1 refills | Status: DC | PRN

## 2017-05-23 NOTE — Patient Instructions (Signed)
You have a frozen shoulder (adhesive capsulitis), a buildup of scar tissue that limits motion of the shoulder joint. Limit lifting and overhead activities as much as possible. Heat 15 minutes at a time 3-4 times a day may help with movement and stiffness. Naproxen 533m twice a day with food for pain and inflammation. Steroid injections in a series have been shown to help with pain and motion but with your infection, diabetes I don't think this is a good idea. Codman exercises (pendulum, wall walking or table slides, arm circles) - do 3 sets of 10 once or twice a day. Physical therapy for rotator cuff strengthening is a consideration once you are out of the painful phase Follow up in 6 weeks.

## 2017-05-25 NOTE — Assessment & Plan Note (Signed)
2/2 adhesive capsulitis.  Heat, naproxen.  Reviewed codman exercises to do daily.  In too much pain to do physical therapy at this time.  We discussed series of cortisone injections but with active infection of her heel and very elevated blood sugars I recommended against these.  F/u in 6 weeks.
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
## 2017-05-26 ENCOUNTER — Other Ambulatory Visit: Payer: Self-pay | Admitting: Family Medicine

## 2017-05-26 DIAGNOSIS — I10 Essential (primary) hypertension: Secondary | ICD-10-CM

## 2017-05-28 ENCOUNTER — Other Ambulatory Visit: Payer: Self-pay | Admitting: Family Medicine

## 2017-05-28 DIAGNOSIS — I10 Essential (primary) hypertension: Secondary | ICD-10-CM

## 2017-05-29 ENCOUNTER — Ambulatory Visit (INDEPENDENT_AMBULATORY_CARE_PROVIDER_SITE_OTHER): Payer: BLUE CROSS/BLUE SHIELD | Admitting: Orthopedic Surgery

## 2017-05-29 ENCOUNTER — Telehealth: Payer: Self-pay | Admitting: *Deleted

## 2017-05-29 DIAGNOSIS — L97421 Non-pressure chronic ulcer of left heel and midfoot limited to breakdown of skin: Secondary | ICD-10-CM | POA: Diagnosis not present

## 2017-05-29 DIAGNOSIS — E1142 Type 2 diabetes mellitus with diabetic polyneuropathy: Secondary | ICD-10-CM
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
## 2017-05-29 NOTE — Telephone Encounter (Signed)
Received Copy of Written Consultation for patient who requested remotely via [facility]; forwarded to provider/SLS 07/31

## 2017-06-04 ENCOUNTER — Telehealth: Payer: Self-pay | Admitting: Family Medicine

## 2017-06-04 DIAGNOSIS — L97509 Non-pressure chronic ulcer of other part of unspecified foot with unspecified severity: Principal | ICD-10-CM

## 2017-06-04 DIAGNOSIS — E1165 Type 2 diabetes mellitus with hyperglycemia: Principal | ICD-10-CM

## 2017-06-04 DIAGNOSIS — E11621 Type 2 diabetes mellitus with foot ulcer: Secondary | ICD-10-CM

## 2017-06-04 DIAGNOSIS — IMO0002 Reserved for concepts with insufficient information to code with codable children: Secondary | ICD-10-CM

## 2017-06-04 DIAGNOSIS — Z794 Long term (current) use of insulin: Principal | ICD-10-CM

## 2017-06-04 MED ORDER — HYDROCODONE-ACETAMINOPHEN 5-325 MG PO TABS: ORAL_TABLET | ORAL | 0 refills | Status: DC

## 2017-06-04 MED ORDER — INSULIN GLARGINE 100 UNIT/ML SOLOSTAR PEN: PEN_INJECTOR | SUBCUTANEOUS | 0 refills | Status: DC

## 2017-06-04 NOTE — Telephone Encounter (Signed)
Printed / pcp covering signed Notified the patient to pickup hardcopy at the front desk.

## 2017-06-04 NOTE — Telephone Encounter (Signed)
Ok to refill 

## 2017-06-04 NOTE — Telephone Encounter (Signed)
Requesting:   Hydrocodone Contract    11/14/2016 UDS    Low risk on 05/14/2017 Last OV    05/17/2017 Last Refill    #[phone] no refills on 05/15/2017  Please Advise

## 2017-06-04 NOTE — Telephone Encounter (Signed)
Self.   Refill for : HYDROcodone and also [name] [name]    CB: [phone]  Pharmacy: [place], [place]

## 2017-06-05 ENCOUNTER — Telehealth: Payer: Self-pay | Admitting: Family Medicine

## 2017-06-05 NOTE — Telephone Encounter (Signed)
Relation to FK:VQOH Call back [place]:  Reason for call:  Patient moved into an apartment complex requesting dr. Note indicating she needs light due to anxiety concerns, patient states apartment complex will allow her to place a storm door with Dr. Note reflecting patient in need of light, please advise

## 2017-06-06 ENCOUNTER — Encounter: Admit: 2017-06-06 | Discharge: 2017-06-06 | Payer: MEDICARE

## 2017-06-06 NOTE — Telephone Encounter (Signed)
I cannot ethically write such a letter. She will have to wait for her reg PCP to return or perhaps another provider covering would be willing to. TY.

## 2017-06-06 NOTE — Telephone Encounter (Signed)
Pt returned call stating is needing the letter by today at 3:00 pm since they are have the new doors installed today, they need the letter by then. Please advise ASAP.

## 2017-06-06 NOTE — Telephone Encounter (Signed)
Patient moved into an apartment complex requesting dr. Note indicating she needs light due to anxiety concerns, patient states apartment complex will allow her to place a storm door with Dr. Note reflecting patient in need of light, please advise

## 2017-06-07 MED ORDER — CLONIDINE HCL 0.1 MG PO TAB
ORAL_TABLET | Freq: Three times a day (TID) | 0 refills | Status: AC
Start: 2017-06-07 — End: 2017-08-13

## 2017-06-07 MED ORDER — GLIMEPIRIDE 2 MG PO TAB
ORAL_TABLET | Freq: Every day | 0 refills | Status: AC
Start: 2017-06-07 — End: 2017-08-13

## 2017-06-07 MED ORDER — FAMOTIDINE 20 MG PO TAB
ORAL_TABLET | Freq: Two times a day (BID) | ORAL | 0 refills | 90.00000 days | Status: AC
Start: 2017-06-07 — End: 2017-08-13

## 2017-06-07 NOTE — Telephone Encounter (Signed)
Pt called wanting to know about the letter status, pt was informed the below and understood, pt will await for Monday 06-11-2017 when provider returns to the office, Pt would like to be called ASAP when letter ready to be picked up.

## 2017-06-07 NOTE — Telephone Encounter (Signed)
Informed of covering Provider response to her request. She will wait until PCP returns to the office to address this request

## 2017-06-07 NOTE — Telephone Encounter (Signed)
Cannot write this letter, have not met patient and not medically necessary there are other ways to get more light. She can wait for Dr Etter Sjogren and see if she knows more than me and would like to write the letter

## 2017-06-07 NOTE — Telephone Encounter (Signed)
Pt returned call stating is needing the letter by today at 3:00 pm since they are have the new doors installed today, they need the letter by then. Please advise ASAP.

## 2017-06-11 ENCOUNTER — Other Ambulatory Visit: Payer: Self-pay | Admitting: Internal Medicine

## 2017-06-11 NOTE — Telephone Encounter (Signed)
Ok to do note stating that due to her anxiety issues she needs a storm door to let more light in

## 2017-06-12 ENCOUNTER — Encounter: Payer: Self-pay | Admitting: Family Medicine

## 2017-06-12 NOTE — Telephone Encounter (Signed)
Letter has been completed as PCP instructed. Called the patients number/ no answer/left a detailed message that requested letter is completed and is at the front desk for pickup/also will mail her a copy too.

## 2017-06-13 ENCOUNTER — Encounter: Admit: 2017-06-13 | Discharge: 2017-06-13 | Payer: MEDICARE

## 2017-06-13 DIAGNOSIS — Z0000 Encounter for general adult medical examination without abnormal findings: Principal | ICD-10-CM

## 2017-06-14 NOTE — Telephone Encounter (Signed)
[name], [name] [phone] (Phone) [phone] (Fax)   Patient checking on the status of medication request for pregabalin (LYRICA) 150 MG capsule, stating pharmacy requested since Monday and she's completely out,please advise

## 2017-06-14 NOTE — Telephone Encounter (Signed)
Faxed hardcopy for lyrica to [facility] [address]  [employer] [place]

## 2017-06-14 NOTE — Telephone Encounter (Signed)
Last office visit on 05/17/2017 Last refil 05/01/2017 #90 no refills

## 2017-06-19 ENCOUNTER — Ambulatory Visit (INDEPENDENT_AMBULATORY_CARE_PROVIDER_SITE_OTHER): Payer: BLUE CROSS/BLUE SHIELD | Admitting: Orthopedic Surgery

## 2017-06-19 ENCOUNTER — Encounter (INDEPENDENT_AMBULATORY_CARE_PROVIDER_SITE_OTHER): Payer: Self-pay | Admitting: Orthopedic Surgery

## 2017-06-19 DIAGNOSIS — L97421 Non-pressure chronic ulcer of left heel and midfoot limited to breakdown of skin: Secondary | ICD-10-CM | POA: Diagnosis not present
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
## 2017-06-21 ENCOUNTER — Other Ambulatory Visit: Payer: Self-pay | Admitting: Family Medicine

## 2017-06-21 NOTE — Telephone Encounter (Signed)
Caller name: Judie Hollick Relationship to patient: self Can be reached: [phone]  Reason for call: Pt has 1 day left of hydrocodone. Pt moving and has been in pain. Please call when ready.

## 2017-06-22 MED ORDER — HYDROCODONE-ACETAMINOPHEN 5-325 MG PO TABS: ORAL_TABLET | ORAL | 0 refills | Status: DC

## 2017-06-22 NOTE — Telephone Encounter (Signed)
[place] database printed on 05/14/2017 (in media).

## 2017-06-22 NOTE — Telephone Encounter (Signed)
Made pt aware Rx is ready for pick up and placed in the front in folder. Patient state her husband will pick up Rx for her this afternoon. LB

## 2017-06-22 NOTE — Telephone Encounter (Signed)
Refill printed

## 2017-06-22 NOTE — Telephone Encounter (Signed)
Last ov 05/17/17 Last refill 08/ 0006/18 #60 0 Last uds, signed contract 11/14/16. Please advise. LB

## 2017-06-26 ENCOUNTER — Encounter: Admit: 2017-06-26 | Discharge: 2017-06-26 | Payer: MEDICARE

## 2017-06-27 NOTE — Progress Notes
Noted, no change in tx.

## 2017-07-09 ENCOUNTER — Telehealth: Payer: Self-pay | Admitting: Family Medicine

## 2017-07-09 NOTE — Telephone Encounter (Signed)
Pt req refill pain med. Pt says foot still improving and she is managing diabetes well. Pt states husband will come pick up script when ready.

## 2017-07-10 MED ORDER — HYDROCODONE-ACETAMINOPHEN 5-325 MG PO TABS: ORAL_TABLET | ORAL | 0 refills | Status: DC

## 2017-07-10 NOTE — Telephone Encounter (Signed)
Refill x1 

## 2017-07-10 NOTE — Telephone Encounter (Signed)
Pt is aware that rx is ready for pick up. She had no additional questions at this time.

## 2017-07-10 NOTE — Telephone Encounter (Signed)
Dr. Etter Sjogren please advise on refill of ydrocodone  Last OV: 05/17/2017 Last filled: 06/22/17 Qty:60 Rf:0

## 2017-07-10 NOTE — Telephone Encounter (Signed)
Rx printed and awaiting sig from physician

## 2017-07-10 NOTE — Addendum Note (Signed)
Addended by: Benson Setting L on: 07/10/2017 11:54 AM   Modules accepted: Orders

## 2017-07-12 ENCOUNTER — Other Ambulatory Visit: Payer: Self-pay | Admitting: Family Medicine

## 2017-07-12 DIAGNOSIS — F32A Depression, unspecified: Secondary | ICD-10-CM

## 2017-07-12 DIAGNOSIS — E785 Hyperlipidemia, unspecified: Secondary | ICD-10-CM

## 2017-07-12 DIAGNOSIS — F329 Major depressive disorder, single episode, unspecified: Secondary | ICD-10-CM

## 2017-07-17 ENCOUNTER — Ambulatory Visit (INDEPENDENT_AMBULATORY_CARE_PROVIDER_SITE_OTHER): Payer: BLUE CROSS/BLUE SHIELD | Admitting: Orthopedic Surgery

## 2017-07-17 ENCOUNTER — Encounter: Admit: 2017-07-17 | Discharge: 2017-07-17 | Payer: MEDICARE

## 2017-07-17 DIAGNOSIS — E1142 Type 2 diabetes mellitus with diabetic polyneuropathy: Secondary | ICD-10-CM | POA: Diagnosis not present

## 2017-07-17 DIAGNOSIS — L97421 Non-pressure chronic ulcer of left heel and midfoot limited to breakdown of skin: Secondary | ICD-10-CM
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
## 2017-07-18 MED ORDER — CYCLOBENZAPRINE 5 MG PO TAB
ORAL_TABLET | Freq: Every evening | ORAL | 0 refills | 30.00000 days | Status: AC
Start: 2017-07-18 — End: 2018-07-16

## 2017-07-23 ENCOUNTER — Other Ambulatory Visit: Payer: Self-pay | Admitting: Family Medicine

## 2017-07-23 DIAGNOSIS — E1165 Type 2 diabetes mellitus with hyperglycemia: Principal | ICD-10-CM

## 2017-07-23 DIAGNOSIS — E11621 Type 2 diabetes mellitus with foot ulcer: Secondary | ICD-10-CM

## 2017-07-23 DIAGNOSIS — IMO0002 Reserved for concepts with insufficient information to code with codable children: Secondary | ICD-10-CM

## 2017-07-23 DIAGNOSIS — L97509 Non-pressure chronic ulcer of other part of unspecified foot with unspecified severity: Principal | ICD-10-CM

## 2017-07-23 DIAGNOSIS — Z794 Long term (current) use of insulin: Principal | ICD-10-CM

## 2017-07-24 ENCOUNTER — Encounter: Admit: 2017-07-24 | Discharge: 2017-07-24 | Payer: MEDICARE

## 2017-07-26 ENCOUNTER — Telehealth: Payer: Self-pay | Admitting: Family Medicine

## 2017-07-26 NOTE — Telephone Encounter (Signed)
Refill

## 2017-07-26 NOTE — Telephone Encounter (Signed)
Refill lyrica x 6 months  Refill x hydrocodone x 1

## 2017-07-26 NOTE — Telephone Encounter (Signed)
°  Relation to pt: self  Call back number:[phone]   Reason for call:  Patient requesting LYRICA 150 MG capsule and HYDROcodone-acetaminophen (NORCO/VICODIN) 5-325 MG tablet stating she will run out on Sunday, requesting to pick up Rx as soon as possible, please advise

## 2017-07-26 NOTE — Telephone Encounter (Signed)
Requesting: Lyrica & Hydrocodone-APAP Contract: Yes UDS: low-risk next screen 7.16.18 Last OV: 7.19.18 Next OV: Not scheduled Last Refill: 8.16.18 & 9.11.16 (states will run out on Sunday would like to P/U today)   Please advise

## 2017-07-27 MED ORDER — PREGABALIN 150 MG PO CAPS: ORAL_CAPSULE | ORAL | 0 refills | Status: DC

## 2017-07-27 MED ORDER — HYDROCODONE-ACETAMINOPHEN 5-325 MG PO TABS: ORAL_TABLET | ORAL | 0 refills | Status: DC

## 2017-07-27 NOTE — Telephone Encounter (Signed)
MO-Would you be willing to sign these 2 Rx's on Dr. Nonda Lou behalf while out of town? She had sent me a message stating ok to refill/plz advise/thx dmf

## 2017-07-27 NOTE — Telephone Encounter (Signed)
Reviewed controlled substance registry. Refills are appropriate.  Please advise pt that she is due for UDS and to provide urine drug screen today when she picks up her prescriptions.

## 2017-07-27 NOTE — Telephone Encounter (Signed)
Out of office

## 2017-07-27 NOTE — Telephone Encounter (Signed)
LMOVM that Rx's are ready to P/U at the front desk/thx dmf

## 2017-08-13 ENCOUNTER — Other Ambulatory Visit: Payer: Self-pay

## 2017-08-13 ENCOUNTER — Other Ambulatory Visit: Payer: Self-pay | Admitting: Family Medicine

## 2017-08-13 ENCOUNTER — Encounter: Admit: 2017-08-13 | Discharge: 2017-08-13 | Payer: MEDICARE

## 2017-08-13 MED ORDER — FAMOTIDINE 20 MG PO TAB
ORAL_TABLET | Freq: Two times a day (BID) | ORAL | 0 refills | 90.00000 days | Status: AC
Start: 2017-08-13 — End: 2017-10-16

## 2017-08-13 MED ORDER — CLONIDINE HCL 0.1 MG PO TAB
ORAL_TABLET | Freq: Three times a day (TID) | 0 refills | Status: AC
Start: 2017-08-13 — End: 2017-10-16

## 2017-08-13 MED ORDER — GLIMEPIRIDE 2 MG PO TAB
ORAL_TABLET | Freq: Every day | 0 refills | Status: AC
Start: 2017-08-13 — End: 2017-10-16

## 2017-08-13 NOTE — Telephone Encounter (Addendum)
HYDROCODONE-ACETAMINOPHINE refill please per pt request. Call pt when ready [phone]. No dose for tomorrow 08/14/17.

## 2017-08-14 ENCOUNTER — Other Ambulatory Visit: Payer: Self-pay

## 2017-08-14 ENCOUNTER — Ambulatory Visit (INDEPENDENT_AMBULATORY_CARE_PROVIDER_SITE_OTHER): Payer: BLUE CROSS/BLUE SHIELD | Admitting: Orthopedic Surgery

## 2017-08-14 MED ORDER — HYDROCODONE-ACETAMINOPHEN 5-325 MG PO TABS: ORAL_TABLET | ORAL | 0 refills | Status: DC

## 2017-08-14 NOTE — Telephone Encounter (Signed)
Pt called in to follow up on refill request. She would like to pick up today if possible due to PCP out of the office tomorrow.    CB: [phone]

## 2017-08-14 NOTE — Telephone Encounter (Signed)
Pt is requesting refill for Hydrocodone   Last Fill Date- 07/27/17 Quantity-60 with 0 refills 1 to 2 tabs every 4 hours as needed for pain Last office visit was 05/17/17 Next office visit- N/A UDS was due on 05/14/17 Contract Signed 11/14/16

## 2017-08-15 ENCOUNTER — Other Ambulatory Visit: Payer: Self-pay | Admitting: Emergency Medicine

## 2017-08-15 NOTE — Telephone Encounter (Signed)
Naiah Donahoe Self [phone]  HYDROcodone-acetaminophen (NORCO/VICODIN) 5-325 MG tablet  [name] [name] returned your call, she stated that she does still need this medicine that she barely got any sleep last night for being in pain. Please call when ready for pick up and she will send husband to pick up.

## 2017-08-15 NOTE — Telephone Encounter (Signed)
Pt called and seemed to be extremely upset about not having refill for her Norco. Pt stated on the phone that she's been taking 1 tablet every 4 hours for pain but sometimes she takes more because her pain intensifies. I tried advise the Pt that she should come in and be seen if she feels her pain is not being adequately managed or if she feels like there's been a health change she feels the provider should know of, however Pt began to scream and yell at me over the phone. I asked Pt if she could please refrain from yelling because I couldn't understand what she was saying and also because the more she yelled the more she seemed to be upsetting herself. I advised Pt that stress and being upset could contribute to or worsen body aches and I will do everything I could to help her but I needed her to calm down so I could better understand her and help to find a solution or understand what was going on. Pt began to tell me the she couldn't sleep last night due to pain and would go to the ER if need be. As I began to tell Pt that her Rx was ready and signed she began to again yell and scream saying "you're not tryig to help me you're only going to tell me no" I tried to inform Pt that her Rx was ready and advise a visit may be needed in the future however Pt kept yelling and refused to listen. Rx is placed upfront and ready for pick up

## 2017-08-15 NOTE — Telephone Encounter (Signed)
NCCSR: she gets hydrocodone on a regular basis from our office.  No concerning external providers UDS:1/18 Contract:1/18 Last visit with Dr. Etter Sjogren in July Will refill for her today

## 2017-08-15 NOTE — Telephone Encounter (Signed)
Actually looking in chart,  I think Dr. Etter Sjogren may have filled this for her yesterday.   Looked in pick up drawer- no rx there.  Called pt and LMOM- does she still need this rx?  Please let us know

## 2017-08-16 ENCOUNTER — Ambulatory Visit (INDEPENDENT_AMBULATORY_CARE_PROVIDER_SITE_OTHER): Payer: PRIVATE HEALTH INSURANCE | Admitting: Family Medicine

## 2017-08-16 ENCOUNTER — Encounter: Payer: Self-pay | Admitting: Family Medicine

## 2017-08-16 ENCOUNTER — Ambulatory Visit (INDEPENDENT_AMBULATORY_CARE_PROVIDER_SITE_OTHER): Payer: PRIVATE HEALTH INSURANCE | Admitting: Orthopedic Surgery

## 2017-08-16 ENCOUNTER — Encounter (INDEPENDENT_AMBULATORY_CARE_PROVIDER_SITE_OTHER): Payer: Self-pay | Admitting: Orthopedic Surgery

## 2017-08-16 VITALS — BP 130/78 | HR 76 | Temp 98.9°F | Ht 62.0 in | Wt 182.0 lb

## 2017-08-16 DIAGNOSIS — E1151 Type 2 diabetes mellitus with diabetic peripheral angiopathy without gangrene: Secondary | ICD-10-CM

## 2017-08-16 DIAGNOSIS — M5440 Lumbago with sciatica, unspecified side: Secondary | ICD-10-CM

## 2017-08-16 DIAGNOSIS — L97521 Non-pressure chronic ulcer of other part of left foot limited to breakdown of skin: Secondary | ICD-10-CM

## 2017-08-16 DIAGNOSIS — E1142 Type 2 diabetes mellitus with diabetic polyneuropathy: Secondary | ICD-10-CM | POA: Diagnosis not present

## 2017-08-16 DIAGNOSIS — Z0289 Encounter for other administrative examinations: Secondary | ICD-10-CM

## 2017-08-16 DIAGNOSIS — E785 Hyperlipidemia, unspecified: Secondary | ICD-10-CM | POA: Diagnosis not present

## 2017-08-16 DIAGNOSIS — E1165 Type 2 diabetes mellitus with hyperglycemia: Secondary | ICD-10-CM

## 2017-08-16 DIAGNOSIS — L97421 Non-pressure chronic ulcer of left heel and midfoot limited to breakdown of skin: Secondary | ICD-10-CM | POA: Diagnosis not present

## 2017-08-16 DIAGNOSIS — Z794 Long term (current) use of insulin: Secondary | ICD-10-CM | POA: Diagnosis not present

## 2017-08-16 DIAGNOSIS — I10 Essential (primary) hypertension: Secondary | ICD-10-CM | POA: Diagnosis not present

## 2017-08-16 DIAGNOSIS — S42202D Unspecified fracture of upper end of left humerus, subsequent encounter for fracture with routine healing: Secondary | ICD-10-CM | POA: Diagnosis not present

## 2017-08-16 DIAGNOSIS — IMO0002 Reserved for concepts with insufficient information to code with codable children: Secondary | ICD-10-CM

## 2017-08-16 DIAGNOSIS — M797 Fibromyalgia: Secondary | ICD-10-CM | POA: Diagnosis not present

## 2017-08-16 DIAGNOSIS — M79672 Pain in left foot: Secondary | ICD-10-CM

## 2017-08-16 DIAGNOSIS — G8929 Other chronic pain: Secondary | ICD-10-CM

## 2017-08-16 LAB — COMPREHENSIVE METABOLIC PANEL
ALBUMIN: 3.9 g/dL (ref 3.5–5.2)
ALK PHOS: 85 U/L (ref 39–117)
ALT: 17 U/L (ref 0–35)
AST: 20 U/L (ref 0–37)
BUN: 24 mg/dL — AB (ref 6–23)
CALCIUM: 9.4 mg/dL (ref 8.4–10.5)
CO2: 30 mEq/L (ref 19–32)
CREATININE: 1.49 mg/dL — AB (ref 0.40–1.20)
Chloride: 102 mEq/L (ref 96–112)
GFR: 37.74 mL/min — ABNORMAL LOW (ref >60.00)
Glucose, Bld: 63 mg/dL — ABNORMAL LOW (ref 70–99)
Potassium: 4 mEq/L (ref 3.5–5.1)
Sodium: 138 mEq/L (ref 135–145)
Total Bilirubin: 0.5 mg/dL (ref 0.2–1.2)
Total Protein: 7.7 g/dL (ref 6.0–8.3)

## 2017-08-16 LAB — VITAMIN D 25 HYDROXY (VIT D DEFICIENCY, FRACTURES): VITD: 47.87 ng/mL (ref 30.00–100.00)

## 2017-08-16 LAB — LIPID PANEL
CHOL/HDL RATIO: 3
Cholesterol: 104 mg/dL (ref 0–200)
HDL: 38.1 mg/dL — AB (ref >39.00)
LDL CALC: 47 mg/dL (ref 0–99)
NONHDL: 65.56
TRIGLYCERIDES: 94 mg/dL (ref 0.0–149.0)
VLDL: 18.8 mg/dL (ref 0.0–40.0)

## 2017-08-16 LAB — MICROALBUMIN / CREATININE URINE RATIO
CREATININE, U: 64.4 mg/dL
MICROALB/CREAT RATIO: 3.2 mg/g (ref 0.0–30.0)
Microalb, Ur: 2 mg/dL — ABNORMAL HIGH (ref 0.0–1.9)

## 2017-08-16 LAB — HEMOGLOBIN A1C: Hgb A1c MFr Bld: 8.4 % — ABNORMAL HIGH (ref 4.6–6.5)

## 2017-08-16 MED ORDER — HYDROCODONE-ACETAMINOPHEN 5-325 MG PO TABS: ORAL_TABLET | ORAL | 0 refills | Status: DC

## 2017-08-16 MED ORDER — SILVER SULFADIAZINE 1 % EX CREA: 1.0000 "application " | TOPICAL_CREAM | Freq: Every day | CUTANEOUS | 2 refills | Status: DC

## 2017-08-16 MED ORDER — CYCLOBENZAPRINE HCL 10 MG PO TABS: 10.0000 mg | ORAL_TABLET | Freq: Three times a day (TID) | ORAL | 1 refills | Status: DC | PRN

## 2017-08-16 MED ORDER — HYDROCODONE-ACETAMINOPHEN 5-325 MG PO TABS: 1.0000 | ORAL_TABLET | Freq: Four times a day (QID) | ORAL | 0 refills | Status: DC | PRN

## 2017-08-16 MED ORDER — SILVER SULFADIAZINE 1 % EX CREA: 1.0000 "application " | TOPICAL_CREAM | Freq: Every day | CUTANEOUS | 0 refills | Status: DC

## 2017-08-16 NOTE — Patient Instructions (Signed)
Chronic Pain, Adult Chronic pain is a type of pain that lasts or keeps coming back (recurs) for at least six months. You may have chronic headaches, abdominal pain, or body pain. Chronic pain may be related to an illness, such as fibromyalgia or complex regional pain syndrome. Sometimes the cause of chronic pain is not known. Chronic pain can make it hard for you to do daily activities. If not treated, chronic pain can lead to other health problems, including anxiety and depression. Treatment depends on the cause and severity of your pain. You may need to work with a pain specialist to come up with a treatment plan. The plan may include medicine, counseling, and physical therapy. Many people benefit from a combination of two or more types of treatment to control their pain. Follow these instructions at home: Lifestyle  Consider keeping a pain diary to share with your health care providers.  Consider talking with a mental health care provider (psychologist) about how to cope with chronic pain.  Consider joining a chronic pain support group.  Try to control or lower your stress levels. Talk to your health care provider about strategies to do this. General instructions   Take over-the-counter and prescription medicines only as told by your health care provider.  Follow your treatment plan as told by your health care provider. This may include: ? Gentle, regular exercise. ? Eating a healthy diet that includes foods such as vegetables, fruits, fish, and lean meats. ? Cognitive or behavioral therapy. ? Working with a Community education officer. ? Meditation or yoga. ? Acupuncture or massage therapy. ? Aroma, color, light, or sound therapy. ? Local electrical stimulation. ? Shots (injections) of numbing or pain-relieving medicines into the spine or the area of pain.  Check your pain level as told by your health care provider. Ask your health care provider if you should use a pain scale.  Learn as  much as you can about how to manage your chronic pain. Ask your health care provider if an intensive pain rehabilitation program or a chronic pain specialist would be helpful.  Keep all follow-up visits as told by your health care provider. This is important. Contact a health care provider if:  Your pain gets worse.  You have new pain.  You have trouble sleeping.  You have trouble doing your normal activities.  Your pain is not controlled with treatment.  Your have side effects from pain medicine.  You feel weak. Get help right away if:  You lose feeling or have numbness in your body.  You lose control of bowel or bladder function.  Your pain suddenly gets much worse.  You develop shaking or chills.  You develop confusion.  You develop chest pain.  You have trouble breathing or shortness of breath.  You pass out.  You have thoughts about hurting yourself or others. This information is not intended to replace advice given to you by your health care provider. Make sure you discuss any questions you have with your health care provider. Document Released: 07/08/2002 Document Revised: 06/15/2016 Document Reviewed: 04/04/2016 Elsevier Interactive Patient Education  2017 [employer].

## 2017-08-16 NOTE — Progress Notes (Signed)
[name], female    DOB: 10-11-1956  Age: 61 y.o. MRN: [id number]    Subjective:  Subjective  HPI [name] [name] presents for pain meds --- pt has been worse.  Pain management declined pt-- pt is seeing Dr Sharol Given  For her L foot  Pa also struggling with R arm pain from previous fx-- [facility] was going to refer her to pain management but because we did they did not.  Review of Systems  Constitutional: Negative for activity change, appetite change and unexpected weight change.  Respiratory: Negative for cough and shortness of breath.   Cardiovascular: Negative for chest pain and palpitations.  Skin: Positive for wound.  Psychiatric/Behavioral: Negative for behavioral problems and dysphoric mood. The patient is not nervous/anxious.     History Past Medical History:  Diagnosis Date  . Acute MI [facility]) 2007   presented to ED & had cardiac cath- but found to have normal coronaries. Since that point in time her PCP cares f or cardiac needs. Dr. [name] - [facility]  . Anemia   . Anginal pain ([place])   . Anxiety   . Asthma   . Bulging lumbar disc   . Cataract   . 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 Cirrhosis of liver without mention of alcohol   . Constipation   . Dehiscence of closure of skin    left partial calcaneal excision  . Depression   . Diabetes mellitus    insulin dependent, adult onset  . Episode of visual loss of left eye   . Exertional shortness of breath   . Fatty liver   . Fibromyalgia   . GERD (gastroesophageal reflux disease)   . Hepatic steatosis   . High cholesterol   . Hypertension   . MRSA (methicillin resistant Staphylococcus aureus)   . Neuropathy    lower legs  . Osteoarthritis    hands, hips  . Proximal humerus fracture 10/15/12   Left  . PTSD (post-traumatic stress disorder)   . 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   Her family history includes Cancer in her father; Colitis in her father; Crohn's disease in her father; Diabetes in her father and mother; Diabetes Mellitus I in her brother; Diabetes Mellitus II in her brother, brother, brother, and brother; Heart attack in her brother; Heart disease in her brother and father; Hypertension in her mother; Irritable bowel syndrome in her daughter; Kidney disease in her brother, brother, brother, and brother; Leukemia in her father; Liver disease in her brother and brother; Mental illness in her mother.She reports that she has never smoked. She has never used smokeless tobacco. She reports that she does not drink alcohol or use drugs.  Current Outpatient Prescriptions on File Prior to Visit  Medication Sig Dispense Refill  . atorvastatin (LIPITOR) 10 MG tablet TAKE ONE TABLET BY MOUTH ONCE DAILY 60 tablet 2  . Calcium Carbonate-Vitamin D (CALTRATE 600+D) 600-400 MG-UNIT per tablet Take 1 tablet by mouth daily.     . Cholecalciferol (  VITAMIN D3) 5000 UNITS CAPS Take 1 capsule by mouth 2 (two) times daily.    [name] Kitchen CINNAMON PO Take 1,000 mg by mouth daily.    . collagenase (SANTYL) ointment Apply topically daily. (Patient not taking: Reported on 08/16/2017) 15 g 0  . DULoxetine (CYMBALTA) 60 MG capsule TAKE ONE CAPSULE BY MOUTH ONCE DAILY 30 capsule 5  . ENSURE PLUS (ENSURE PLUS) LIQD Take 237 mLs by mouth daily.     . fenofibrate 160 MG tablet Take 1 tablet (160 mg total) by mouth daily. 30 tablet 5  .  ferrous sulfate (SLOW FE) 160 (50 FE) MG TBCR SR tablet Take 160 mg by mouth daily.    . Flaxseed, Linseed, (FLAX SEED OIL) 1000 MG CAPS Take 1 capsule by mouth at bedtime.    . hydrochlorothiazide (HYDRODIURIL) 25 MG tablet TAKE ONE TABLET BY MOUTH ONCE DAILY 30 tablet 11  . insulin aspart (NOVOLOG FLEXPEN) 100 UNIT/ML FlexPen 13 u sq tid 15 mL 11  . LANTUS SOLOSTAR 100 UNIT/ML Solostar Pen INJECT 18 UNITS SUBCUTANEOUSLY IN THE MORNING AND 40 AT BEDTIME 15 pen 0  . levofloxacin (LEVAQUIN) 750 MG tablet Take 1 tablet (750 mg total) by mouth every other day. 30 tablet 0  . Multiple Vitamin (MULTIVITAMIN) tablet Take 1 tablet by mouth daily.     . naproxen (NAPROSYN) 500 MG tablet Take 1 tablet (500 mg total) by mouth 2 (two) times daily as needed. 60 tablet 1  . ondansetron (ZOFRAN ODT) 8 MG disintegrating tablet Take 1 tablet (8 mg total) by mouth every 8 (eight) hours as needed for nausea or vomiting. 20 tablet 0  . polyethylene glycol (MIRALAX / GLYCOLAX) packet Take 17 g by mouth daily.      . pregabalin (LYRICA) 150 MG capsule TAKE 1 CAPSULE BY MOUTH THREE TIMES DAILY AS NEEDED 90 capsule 0  . silver sulfADIAZINE (SILVADENE) 1 % cream Apply 1 application topically daily. 50 g 2  . vitamin C (ASCORBIC ACID) 500 MG tablet Take 500 mg by mouth daily.    . [DISCONTINUED] metFORMIN (GLUCOPHAGE) 1000 MG tablet Take 1,000 mg by mouth 2 (two) times daily with a meal.      . [DISCONTINUED] omeprazole (PRILOSEC) 20 MG capsule Take 20 mg by mouth daily.       No current facility-administered medications on file prior to visit.      Objective:  Objective  Physical Exam  Constitutional: She is oriented to person, place, and time. She appears well-developed and well-nourished.  HENT:  Head: Normocephalic and atraumatic.  Eyes: Conjunctivae and EOM are normal.  Neck: Normal range of motion. Neck supple. No JVD present. Carotid bruit is not present. No thyromegaly present.  Cardiovascular: Normal  rate, regular rhythm and normal heart sounds.   No murmur heard. Pulmonary/Chest: Effort normal and breath sounds normal. No respiratory distress. She has no wheezes. She has no rales. She exhibits no tenderness.  Musculoskeletal: She exhibits no edema.  Neurological: She is alert and oriented to person, place, and time.  Psychiatric: She has a normal mood and affect.   There were no vitals taken for this visit. Wt Readings from Last 3 Encounters:  05/23/17 182 lb (82.6 kg)  05/17/17 164 lb (74.4 kg)  04/18/17 190 lb (86.2 kg)     Lab Results  Component Value Date   WBC 7.5 04/19/2017   HGB 10.9 (L) 04/19/2017   HCT 33.9 (L) 04/19/2017   PLT 176.0 04/19/2017   GLUCOSE 63 (  L) 08/16/2017   CHOL 104 08/16/2017   TRIG 94.0 08/16/2017   HDL 38.10 (L) 08/16/2017   LDLDIRECT 115.8 01/23/2012   LDLCALC 47 08/16/2017   ALT 17 08/16/2017   AST 20 08/16/2017   NA 138 08/16/2017   K 4.0 08/16/2017   CL 102 08/16/2017   CREATININE 1.49 (H) 08/16/2017   BUN 24 (H) 08/16/2017   CO2 30 08/16/2017   TSH 1.65 01/03/2016   INR 1.16 04/09/2017   HGBA1C 8.4 (H) 08/16/2017   MICROALBUR 2.0 (H) 08/16/2017    Ct Head Wo Contrast  Result Date: 04/06/2017 CLINICAL DATA:  Dizziness and multiple falls. EXAM: CT HEAD WITHOUT CONTRAST TECHNIQUE: Contiguous axial images were obtained from the base of the skull through the vertex without intravenous contrast. COMPARISON:  Head CT 03/26/2015 FINDINGS: Brain: No mass lesion, intraparenchymal hemorrhage or extra-axial collection. No evidence of acute cortical infarct. Unchanged posterior fossa arachnoid cyst. Vascular: No hyperdense vessel or unexpected calcification. Skull: Normal visualized skull base, calvarium and extracranial soft tissues. Sinuses/Orbits: No sinus fluid levels or advanced mucosal thickening. No mastoid effusion. Normal orbits. IMPRESSION: No acute intracranial abnormality. Unchanged arachnoid cyst and otherwise normal brain.  Electronically Signed   By: Ulyses Jarred M.D.   On: 04/06/2017 14:56   Mr Foot Left Wo Contrast  Result Date: 04/06/2017 CLINICAL DATA:  Insulin-dependent diabetic with chronic left heel ulcer, status post partial calcaneal excision last December for osteomyelitis. EXAM: MRI OF THE LEFT FOOT WITHOUT CONTRAST TECHNIQUE: Multiplanar, multisequence MR imaging of the left heel was performed. No intravenous contrast was administered. COMPARISON:  None. FINDINGS: Limited assessment due to patient motion artifacts and lack of IV contrast. Bones/Joint/Cartilage Partial resection of the dorsal and plantar aspect of the calcaneus with small residual posterior calcaneal fragment receiving the plantar fascia and Achilles tendon noted within the adjacent soft tissues. Ankle mortise is maintained. The distal tibia and fibula are unremarkable as is the talus. The subtalar joint appears intact. Reactive marrow edema of the midfoot articulations with subtle linear hypointense signal within the medial aspect of the tarsal navicular surrounded by edema, on series 3 image 13 and series 4, image 14 likely reflects a nondisplaced navicular fracture. No conclusive findings for recurrence of osteomyelitis. Ligaments Intact anterior, intraosseous and posterior tibiofibular ligaments along the lateral aspect. Intact deltoid ligament. Muscles and Tendons Diffuse generalized intramuscular atrophy and edema. Soft tissues Diffuse soft tissue edema of the visualized ankle and foot with soft tissue ulceration along the lateral aspect of the hindfoot. No abnormal fluid collections are apparent. No definite abscess though study is limited without IV contrast. IMPRESSION: 1. Partial resection of the plantar dorsal aspect of the posterior calcaneus with small residual calcaneal fragment within the soft tissues receiving the plantar fascia and Achilles tendon. No recurrence of osteomyelitis is apparent. Postop changes along the surgical margin  noted. 2. Soft tissue ulceration without soft tissue abscess along the lateral aspect of the hindfoot. Diffuse subcutaneous and intramuscular edema with generalized muscle atrophy. 3. Nondisplaced appearing navicular fracture with surrounding edema. Electronically Signed   By: Ashley Royalty M.D.   On: 04/06/2017 23:07   Ap / Lateral X-ray Left Foot  Result Date: 04/06/2017 CLINICAL DATA:  Infection EXAM: LEFT FOOT - 2 VIEW COMPARISON:  February 02, 2016 FINDINGS: Frontal and lateral views were obtained. The patient has had a portion of the calcaneus removed. There is a separated bony fragment posterior to remaining calcaneus. There is extensive subcutaneous air posterior and volar to the  calcaneus. Areas seen in the soft tissues laterally in the calcaneal region. There is extensive soft tissue swelling in the hindfoot region. More proximally, there is flexion of all PIP and DIP joints. No fracture or dislocation. No erosive change or bony destruction evident. Joint spaces appear unremarkable. IMPRESSION: Marked soft tissue swelling in the hindfoot region with extensive subcutaneous region air. Suspect infection with potential hindfoot abscess. Postoperative change involving the calcaneus with bony fragment posterior to the remaining calcaneus which appears well corticated. Much of the posterior calcaneus has been removed. Elsewhere there are flexion deformities of all PIP and DIP joints. No appreciable joint space narrowing or erosion. No acute fracture or dislocation. From an imaging standpoint, MR pre and post-contrast would be the imaging study of choice to optimize assessment for extent of potential abscess. Electronically Signed   By: Lowella Grip III M.D.   On: 04/06/2017 14:57     Assessment & Plan:  Plan  I am having Ms. Dashner start on HYDROcodone-acetaminophen, HYDROcodone-acetaminophen, and cyclobenzaprine. I am also having her maintain her Calcium Carbonate-Vitamin D, multivitamin, polyethylene  glycol, CINNAMON PO, Vitamin D3, Flax Seed Oil, ENSURE PLUS, vitamin C, ferrous sulfate, fenofibrate, ondansetron, collagenase, levofloxacin, insulin aspart, DULoxetine, naproxen, hydrochlorothiazide, atorvastatin, LANTUS SOLOSTAR, pregabalin, and HYDROcodone-acetaminophen.  Meds ordered this encounter  Medications  . HYDROcodone-acetaminophen (NORCO/VICODIN) 5-325 MG tablet    Sig: Take 1 to 2 tablets by mouth every 4 hours as needed    Dispense:  90 tablet    Refill:  0  . HYDROcodone-acetaminophen (NORCO/VICODIN) 5-325 MG tablet    Sig: Take 1 tablet by mouth every 6 (six) hours as needed for moderate pain.    Dispense:  90 tablet    Refill:  0    Do not fill until Sep 16, 2017  . HYDROcodone-acetaminophen (NORCO/VICODIN) 5-325 MG tablet    Sig: Take 1 tablet by mouth every 6 (six) hours as needed for moderate pain.    Dispense:  90 tablet    Refill:  0    Do not fill until Oct 16, 2017  . cyclobenzaprine (FLEXERIL) 10 MG tablet    Sig: Take 1 tablet (10 mg total) by mouth 3 (three) times daily as needed for muscle spasms.    Dispense:  60 tablet    Refill:  1    Problem List Items Addressed This Visit      Unprioritized   Essential hypertension    Well controlled, no changes to meds. Encouraged heart healthy diet such as the DASH diet and exercise as tolerated.       Fibromyalgia - Primary    Pt on chronic pain med s       Relevant Medications   HYDROcodone-acetaminophen (NORCO/VICODIN) 5-325 MG tablet   HYDROcodone-acetaminophen (NORCO/VICODIN) 5-325 MG tablet   HYDROcodone-acetaminophen (NORCO/VICODIN) 5-325 MG tablet   Other Relevant Orders   Ambulatory referral to Pain Clinic   Vitamin D (25 hydroxy) (Completed)   FOOT PAIN, BILATERAL   Relevant Medications   HYDROcodone-acetaminophen (NORCO/VICODIN) 5-325 MG tablet   HYDROcodone-acetaminophen (NORCO/VICODIN) 5-325 MG tablet   HYDROcodone-acetaminophen (NORCO/VICODIN) 5-325 MG tablet   Other Relevant Orders    Ambulatory referral to Pain Clinic   Hyperlipidemia    Tolerating statin, encouraged heart healthy diet, avoid trans fats, minimize simple carbs and saturated fats. Increase exercise as tolerated      Proximal humerus fracture    Chronic pain  Pain management referral       Type 2 diabetes mellitus with  hyperglycemia, with long-term current use of insulin (HCC)    Uncontrolled Per endo      Ulcer of toe of left foot, limited to breakdown of skin ([place])    Per ortho Healing slowly Trying to avoid amputation       Other Visit Diagnoses    DM (diabetes mellitus) type II uncontrolled, periph vascular disorder ([place])       Relevant Orders   Lipid panel (Completed)   Hemoglobin A1c (Completed)   Comprehensive metabolic panel (Completed)   Microalbumin / creatinine urine ratio (Completed)   Hyperlipidemia LDL goal <70       Relevant Orders   Lipid panel (Completed)   Hemoglobin A1c (Completed)   Comprehensive metabolic panel (Completed)   Chronic bilateral low back pain with sciatica, sciatica laterality unspecified       Relevant Medications   HYDROcodone-acetaminophen (NORCO/VICODIN) 5-325 MG tablet   HYDROcodone-acetaminophen (NORCO/VICODIN) 5-325 MG tablet   HYDROcodone-acetaminophen (NORCO/VICODIN) 5-325 MG tablet   cyclobenzaprine (FLEXERIL) 10 MG tablet   Pain management contract agreement       Relevant Orders   Pain Mgmt, Profile 8 w/Conf, U      Follow-up: Return in about 3 months (around 11/16/2017).  Ann Held, DO
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
## 2017-08-17 LAB — PAIN MGMT, PROFILE 8 W/CONF, U
6 Acetylmorphine: NEGATIVE ng/mL (ref <10)
Alcohol Metabolites: NEGATIVE ng/mL (ref <500)
Amphetamines: NEGATIVE ng/mL (ref <500)
BUPRENORPHINE, URINE: NEGATIVE ng/mL (ref <5)
Benzodiazepines: NEGATIVE ng/mL (ref <100)
CREATININE: 58.1 mg/dL
Cocaine Metabolite: NEGATIVE ng/mL (ref <150)
MDMA: NEGATIVE ng/mL (ref <500)
Marijuana Metabolite: NEGATIVE ng/mL (ref <20)
OXIDANT: NEGATIVE ug/mL (ref <200)
Opiates: NEGATIVE ng/mL (ref <100)
Oxycodone: NEGATIVE ng/mL (ref <100)
PH: 5.95 (ref 4.5–9.0)

## 2017-08-17 NOTE — Assessment & Plan Note (Signed)
Pt on chronic pain med s

## 2017-08-17 NOTE — Assessment & Plan Note (Signed)
Per ortho Healing slowly Trying to avoid amputation

## 2017-08-17 NOTE — Assessment & Plan Note (Signed)
Chronic pain  Pain management referral

## 2017-08-17 NOTE — Assessment & Plan Note (Signed)
Tolerating statin, encouraged heart healthy diet, avoid trans fats, minimize simple carbs and saturated fats. Increase exercise as tolerated 

## 2017-08-17 NOTE — Assessment & Plan Note (Signed)
Uncontrolled Per endo

## 2017-08-17 NOTE — Assessment & Plan Note (Signed)
Well controlled, no changes to meds. Encouraged heart healthy diet such as the DASH diet and exercise as tolerated.  °

## 2017-08-22 ENCOUNTER — Encounter: Admit: 2017-08-22 | Discharge: 2017-08-22 | Payer: MEDICARE

## 2017-08-22 MED ORDER — FLUTICASONE 50 MCG/ACTUATION NA SPSN
Freq: Every day | NASAL | 0 refills | 60.00000 days | Status: AC
Start: 2017-08-22 — End: 2019-01-10

## 2017-08-22 MED ORDER — AMLODIPINE 10 MG PO TAB
ORAL_TABLET | Freq: Every day | 0 refills | Status: AC
Start: 2017-08-22 — End: 2018-08-16

## 2017-08-23 ENCOUNTER — Ambulatory Visit: Admit: 2017-08-23 | Discharge: 2017-08-24 | Payer: MEDICARE

## 2017-08-23 ENCOUNTER — Encounter: Admit: 2017-08-23 | Discharge: 2017-08-23 | Payer: MEDICARE

## 2017-08-23 DIAGNOSIS — I10 Essential (primary) hypertension: ICD-10-CM

## 2017-08-23 DIAGNOSIS — D649 Anemia, unspecified: ICD-10-CM

## 2017-08-23 DIAGNOSIS — K589 Irritable bowel syndrome without diarrhea: ICD-10-CM

## 2017-08-23 DIAGNOSIS — M79642 Pain in left hand: ICD-10-CM

## 2017-08-23 DIAGNOSIS — E538 Deficiency of other specified B group vitamins: ICD-10-CM

## 2017-08-23 DIAGNOSIS — E559 Vitamin D deficiency, unspecified: ICD-10-CM

## 2017-08-23 DIAGNOSIS — K2970 Gastritis, unspecified, without bleeding: Principal | ICD-10-CM

## 2017-08-23 DIAGNOSIS — K5900 Constipation, unspecified: ICD-10-CM

## 2017-08-23 DIAGNOSIS — E785 Hyperlipidemia, unspecified: ICD-10-CM

## 2017-08-23 DIAGNOSIS — E1129 Type 2 diabetes mellitus with other diabetic kidney complication: Principal | ICD-10-CM

## 2017-08-23 DIAGNOSIS — G9050 Complex regional pain syndrome I, unspecified: ICD-10-CM

## 2017-08-23 DIAGNOSIS — M549 Dorsalgia, unspecified: ICD-10-CM

## 2017-08-23 DIAGNOSIS — J45909 Unspecified asthma, uncomplicated: ICD-10-CM

## 2017-08-23 DIAGNOSIS — G5600 Carpal tunnel syndrome, unspecified upper limb: ICD-10-CM

## 2017-08-23 DIAGNOSIS — E119 Type 2 diabetes mellitus without complications: ICD-10-CM

## 2017-08-23 DIAGNOSIS — J309 Allergic rhinitis, unspecified: ICD-10-CM
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
## 2017-08-24 LAB — 25-OH VITAMIN D (D2 + D3): Lab: 43 ng/mL — ABNORMAL LOW (ref <150)

## 2017-08-24 LAB — COMPREHENSIVE METABOLIC PANEL: Lab: 134 mg/dL — ABNORMAL HIGH (ref 65–99)

## 2017-08-24 LAB — LIPID PROFILE
Lab: 129 mg/dL — ABNORMAL HIGH (ref >=60)
Lab: 2.6 (calc) (ref <5.0)
Lab: 241 mg/dL — ABNORMAL HIGH (ref <200)

## 2017-08-24 LAB — CBC AND DIFF: Lab: 8.2 Thousand/uL (ref 3.8–10.8)

## 2017-08-24 LAB — HEMOGLOBIN A1C: Lab: 6.6 %{Hb} — ABNORMAL HIGH (ref <5.7)

## 2017-08-26 ENCOUNTER — Encounter: Admit: 2017-08-26 | Discharge: 2017-08-26 | Payer: MEDICARE

## 2017-08-26 NOTE — Progress Notes
Letter to pt re: results:  -A1C excellent at 6.6%.  -Rest of lab looks good/stable.  -F/u in 6 mos for physical/AWV.

## 2017-08-30 ENCOUNTER — Encounter: Payer: Self-pay | Admitting: Family Medicine

## 2017-08-31 ENCOUNTER — Other Ambulatory Visit: Payer: Self-pay | Admitting: Family Medicine

## 2017-08-31 DIAGNOSIS — E1165 Type 2 diabetes mellitus with hyperglycemia: Principal | ICD-10-CM

## 2017-08-31 DIAGNOSIS — E11621 Type 2 diabetes mellitus with foot ulcer: Secondary | ICD-10-CM

## 2017-08-31 DIAGNOSIS — Z794 Long term (current) use of insulin: Principal | ICD-10-CM

## 2017-08-31 DIAGNOSIS — IMO0002 Reserved for concepts with insufficient information to code with codable children: Secondary | ICD-10-CM

## 2017-08-31 DIAGNOSIS — L97509 Non-pressure chronic ulcer of other part of unspecified foot with unspecified severity: Principal | ICD-10-CM

## 2017-09-03 ENCOUNTER — Telehealth: Payer: Self-pay | Admitting: Family Medicine

## 2017-09-03 NOTE — Telephone Encounter (Signed)
Pt called in to request to have her lab results from previous visit mailed out to her at her home address.

## 2017-09-05 ENCOUNTER — Encounter: Payer: Self-pay | Admitting: *Deleted

## 2017-09-05 NOTE — Telephone Encounter (Signed)
Labs mailed

## 2017-09-15 ENCOUNTER — Other Ambulatory Visit: Payer: Self-pay | Admitting: Family

## 2017-09-17 ENCOUNTER — Encounter (INDEPENDENT_AMBULATORY_CARE_PROVIDER_SITE_OTHER): Payer: Self-pay | Admitting: Family

## 2017-09-17 ENCOUNTER — Telehealth: Payer: Self-pay

## 2017-09-17 ENCOUNTER — Ambulatory Visit (INDEPENDENT_AMBULATORY_CARE_PROVIDER_SITE_OTHER): Payer: PRIVATE HEALTH INSURANCE | Admitting: Family

## 2017-09-17 VITALS — Ht 62.0 in | Wt 182.0 lb

## 2017-09-17 DIAGNOSIS — M869 Osteomyelitis, unspecified: Secondary | ICD-10-CM

## 2017-09-17 DIAGNOSIS — E1142 Type 2 diabetes mellitus with diabetic polyneuropathy: Secondary | ICD-10-CM

## 2017-09-17 DIAGNOSIS — L97421 Non-pressure chronic ulcer of left heel and midfoot limited to breakdown of skin: Secondary | ICD-10-CM

## 2017-09-17 DIAGNOSIS — E1165 Type 2 diabetes mellitus with hyperglycemia: Secondary | ICD-10-CM | POA: Diagnosis not present

## 2017-09-17 DIAGNOSIS — Z794 Long term (current) use of insulin: Secondary | ICD-10-CM | POA: Diagnosis not present
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
## 2017-09-17 NOTE — Telephone Encounter (Signed)
Unable to initiate PA via Covermymeds; form faxed to [employer] at [phone]. Form sent for scanning. Awaiting determination.

## 2017-09-17 NOTE — Telephone Encounter (Signed)
Unable to initiate PA via Covermymeds; PA form faxed to [employer] at [phone]. Form sent for scanning. Awaiting determination.

## 2017-09-18 NOTE — Telephone Encounter (Signed)
Copied from [place] [id number]. Topic: Inquiry >> Sep 18, 2017 10:25 AM Scherrie Gerlach wrote: Reason for CRM:  cover my meds states they are following up on prior for novolog flex pen.  They need a hard copy or call the plan directly to submit.  [employer] [phone]

## 2017-09-18 NOTE — Telephone Encounter (Signed)
Hard copy of form was faxed yesterday (09/17/2017). However, will re-fax.

## 2017-09-24 ENCOUNTER — Telehealth: Payer: Self-pay | Admitting: Family Medicine

## 2017-09-24 MED ORDER — INSULIN LISPRO 100 UNIT/ML (KWIKPEN): 13.0000 [IU] | PEN_INJECTOR | Freq: Three times a day (TID) | SUBCUTANEOUS | 11 refills | Status: DC

## 2017-09-24 NOTE — Telephone Encounter (Signed)
Copied from [place]. Topic: Quick Communication - See Telephone Encounter >> Sep 24, 2017  2:12 PM Synthia Innocent wrote: CRM for notification. See Telephone encounter for:  Please Call RX Benefits to make them aware patient does need her insulin, Humalog and Lantus. They will not cover until we make them aware she needs both insulins pens. Plan ID # [id number] RX Benefits Care Case ID # [id number] Ph# [phone] or [phone]  Fax#[phone] 09/24/17.

## 2017-09-24 NOTE — Telephone Encounter (Signed)
Copied from [place]. Topic: Quick Communication - See Telephone Encounter >> Sep 24, 2017  2:12 PM Synthia Innocent wrote: CRM for notification. See Telephone encounter for:  Please Call RX Benefits to make them aware patient does need her insulin, Humalog and Lantus. They will not cover until we make them aware she needs both insulins pens. Plan ID # [id number] RX Benefits Care Case ID # [id number] Ph# [phone] or [phone]  Fax#[phone] 11/26/

## 2017-09-24 NOTE — Telephone Encounter (Signed)
Received PA denial for quantity limit exception.

## 2017-09-24 NOTE — Telephone Encounter (Signed)
Ok to switch to [employer]

## 2017-09-24 NOTE — Telephone Encounter (Signed)
Spoke w/ Pt, informed insurance denied Novalog but we have changed to Humalog and sent to her pharmacy. Pt verbalized understanding.

## 2017-09-24 NOTE — Telephone Encounter (Signed)
Humalog sent to [facility].

## 2017-09-24 NOTE — Telephone Encounter (Signed)
PA denied; Novolog Flex-pen not on formulary. Formulary alternative is Humalog.

## 2017-09-27 ENCOUNTER — Encounter: Admit: 2017-09-27 | Discharge: 2017-09-27 | Payer: MEDICARE

## 2017-09-27 MED ORDER — METFORMIN 1,000 MG PO TAB
ORAL_TABLET | Freq: Two times a day (BID) | 1 refills | Status: AC
Start: 2017-09-27 — End: 2018-02-05

## 2017-10-04 ENCOUNTER — Other Ambulatory Visit: Payer: Self-pay | Admitting: Family Medicine

## 2017-10-04 DIAGNOSIS — M5440 Lumbago with sciatica, unspecified side: Principal | ICD-10-CM

## 2017-10-04 DIAGNOSIS — G8929 Other chronic pain: Secondary | ICD-10-CM

## 2017-10-15 ENCOUNTER — Ambulatory Visit (INDEPENDENT_AMBULATORY_CARE_PROVIDER_SITE_OTHER): Payer: PRIVATE HEALTH INSURANCE | Admitting: Orthopedic Surgery

## 2017-10-15 DIAGNOSIS — L97521 Non-pressure chronic ulcer of other part of left foot limited to breakdown of skin: Secondary | ICD-10-CM

## 2017-10-15 DIAGNOSIS — M869 Osteomyelitis, unspecified: Secondary | ICD-10-CM | POA: Diagnosis not present

## 2017-10-15 DIAGNOSIS — L97421 Non-pressure chronic ulcer of left heel and midfoot limited to breakdown of skin: Secondary | ICD-10-CM | POA: Diagnosis not present
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
## 2017-10-16 ENCOUNTER — Encounter: Admit: 2017-10-16 | Discharge: 2017-10-16 | Payer: MEDICARE

## 2017-10-16 MED ORDER — FAMOTIDINE 20 MG PO TAB
ORAL_TABLET | Freq: Two times a day (BID) | ORAL | 2 refills | 90.00000 days | Status: AC
Start: 2017-10-16 — End: 2018-05-14

## 2017-10-16 MED ORDER — GLIMEPIRIDE 2 MG PO TAB
ORAL_TABLET | Freq: Every day | 2 refills | Status: AC
Start: 2017-10-16 — End: 2017-11-06

## 2017-10-16 MED ORDER — CLONIDINE HCL 0.1 MG PO TAB
ORAL_TABLET | Freq: Three times a day (TID) | 2 refills | Status: AC
Start: 2017-10-16 — End: 2018-05-14

## 2017-10-17 ENCOUNTER — Encounter (INDEPENDENT_AMBULATORY_CARE_PROVIDER_SITE_OTHER): Payer: Self-pay | Admitting: Orthopedic Surgery

## 2017-10-17 MED ORDER — SILVER SULFADIAZINE 1 % EX CREA: 1.0000 "application " | TOPICAL_CREAM | Freq: Every day | CUTANEOUS | 2 refills | Status: DC

## 2017-10-17 NOTE — Addendum Note (Signed)
Addended by: Dondra Prader R on: 10/17/2017 08:40 AM   Modules accepted: Orders

## 2017-11-01 ENCOUNTER — Emergency Department (HOSPITAL_BASED_OUTPATIENT_CLINIC_OR_DEPARTMENT_OTHER): Payer: PRIVATE HEALTH INSURANCE

## 2017-11-01 ENCOUNTER — Emergency Department (HOSPITAL_BASED_OUTPATIENT_CLINIC_OR_DEPARTMENT_OTHER)
Admission: EM | Admit: 2017-11-01 | Discharge: 2017-11-01 | Disposition: A | Discharge status: Home / Self Care | Payer: PRIVATE HEALTH INSURANCE | Attending: Emergency Medicine | Admitting: Emergency Medicine

## 2017-11-01 ENCOUNTER — Other Ambulatory Visit: Payer: Self-pay

## 2017-11-01 ENCOUNTER — Encounter (HOSPITAL_BASED_OUTPATIENT_CLINIC_OR_DEPARTMENT_OTHER): Payer: Self-pay

## 2017-11-01 DIAGNOSIS — R079 Chest pain, unspecified: Secondary | ICD-10-CM | POA: Insufficient documentation

## 2017-11-01 DIAGNOSIS — I129 Hypertensive chronic kidney disease with stage 1 through stage 4 chronic kidney disease, or unspecified chronic kidney disease: Secondary | ICD-10-CM | POA: Diagnosis not present

## 2017-11-01 DIAGNOSIS — I252 Old myocardial infarction: Secondary | ICD-10-CM | POA: Insufficient documentation

## 2017-11-01 DIAGNOSIS — E1122 Type 2 diabetes mellitus with diabetic chronic kidney disease: Secondary | ICD-10-CM | POA: Insufficient documentation

## 2017-11-01 DIAGNOSIS — L03116 Cellulitis of left lower limb: Secondary | ICD-10-CM | POA: Diagnosis not present

## 2017-11-01 DIAGNOSIS — Z794 Long term (current) use of insulin: Secondary | ICD-10-CM | POA: Insufficient documentation

## 2017-11-01 DIAGNOSIS — Z79899 Other long term (current) drug therapy: Secondary | ICD-10-CM | POA: Insufficient documentation

## 2017-11-01 DIAGNOSIS — N183 Chronic kidney disease, stage 3 (moderate): Secondary | ICD-10-CM | POA: Insufficient documentation

## 2017-11-01 DIAGNOSIS — J45909 Unspecified asthma, uncomplicated: Secondary | ICD-10-CM | POA: Insufficient documentation

## 2017-11-01 DIAGNOSIS — I2510 Atherosclerotic heart disease of native coronary artery without angina pectoris: Secondary | ICD-10-CM | POA: Insufficient documentation

## 2017-11-01 DIAGNOSIS — R2242 Localized swelling, mass and lump, left lower limb: Secondary | ICD-10-CM | POA: Diagnosis present

## 2017-11-01 LAB — COMPREHENSIVE METABOLIC PANEL
ALT: 17 U/L (ref 14–54)
ANION GAP: 10 (ref 5–15)
AST: 22 U/L (ref 15–41)
Albumin: 3.4 g/dL — ABNORMAL LOW (ref 3.5–5.0)
Alkaline Phosphatase: 84 U/L (ref 38–126)
BUN: 27 mg/dL — ABNORMAL HIGH (ref 6–20)
CALCIUM: 8.8 mg/dL — AB (ref 8.9–10.3)
CO2: 25 mmol/L (ref 22–32)
Chloride: 101 mmol/L (ref 101–111)
Creatinine, Ser: 1.5 mg/dL — ABNORMAL HIGH (ref 0.44–1.00)
GFR calc non Af Amer: 36 mL/min — ABNORMAL LOW (ref >60)
GFR, EST AFRICAN AMERICAN: 42 mL/min — AB (ref >60)
Glucose, Bld: 115 mg/dL — ABNORMAL HIGH (ref 65–99)
POTASSIUM: 3.9 mmol/L (ref 3.5–5.1)
Sodium: 136 mmol/L (ref 135–145)
TOTAL PROTEIN: 7.8 g/dL (ref 6.5–8.1)
Total Bilirubin: 0.5 mg/dL (ref 0.3–1.2)

## 2017-11-01 LAB — CBC WITH DIFFERENTIAL/PLATELET
BASOS ABS: 0 10*3/uL (ref 0.0–0.1)
Basophils Relative: 0 %
Eosinophils Absolute: 0.1 10*3/uL (ref 0.0–0.7)
Eosinophils Relative: 2 %
HEMATOCRIT: 30.7 % — AB (ref 36.0–46.0)
HEMOGLOBIN: 9.5 g/dL — AB (ref 12.0–15.0)
LYMPHS PCT: 19 %
Lymphs Abs: 1.3 10*3/uL (ref 0.7–4.0)
MCH: 25.7 pg — ABNORMAL LOW (ref 26.0–34.0)
MCHC: 30.9 g/dL (ref 30.0–36.0)
MCV: 83.2 fL (ref 78.0–100.0)
MONO ABS: 0.8 10*3/uL (ref 0.1–1.0)
MONOS PCT: 11 %
NEUTROS ABS: 4.8 10*3/uL (ref 1.7–7.7)
NEUTROS PCT: 68 %
Platelets: 176 10*3/uL (ref 150–400)
RBC: 3.69 MIL/uL — ABNORMAL LOW (ref 3.87–5.11)
RDW: 15.1 % (ref 11.5–15.5)
WBC: 7 10*3/uL (ref 4.0–10.5)

## 2017-11-01 LAB — URINALYSIS, ROUTINE W REFLEX MICROSCOPIC
Bilirubin Urine: NEGATIVE
GLUCOSE, UA: NEGATIVE mg/dL
Ketones, ur: NEGATIVE mg/dL
Nitrite: NEGATIVE
PH: 6 (ref 5.0–8.0)
Protein, ur: NEGATIVE mg/dL
SPECIFIC GRAVITY, URINE: 1.01 (ref 1.005–1.030)

## 2017-11-01 LAB — I-STAT CG4 LACTIC ACID, ED
LACTIC ACID, VENOUS: 0.57 mmol/L (ref 0.5–1.9)
Lactic Acid, Venous: 1.08 mmol/L (ref 0.5–1.9)

## 2017-11-01 LAB — URINALYSIS, MICROSCOPIC (REFLEX)

## 2017-11-01 LAB — TROPONIN I: Troponin I: <0.03 ng/mL (ref <0.03)

## 2017-11-01 MED ORDER — CLINDAMYCIN PHOSPHATE 600 MG/50ML IV SOLN
600.0000 mg | Freq: Once | INTRAVENOUS | Status: AC
  Administered 2017-11-01: 600 mg via INTRAVENOUS

## 2017-11-01 MED ORDER — ONDANSETRON HCL 4 MG/2ML IJ SOLN
4.0000 mg | Freq: Once | INTRAMUSCULAR | Status: AC
  Administered 2017-11-01: 4 mg via INTRAVENOUS
  Filled 2017-11-01: qty 2

## 2017-11-01 MED ORDER — CLINDAMYCIN HCL 150 MG PO CAPS: 450.0000 mg | ORAL_CAPSULE | Freq: Three times a day (TID) | ORAL | 0 refills | Status: AC

## 2017-11-01 MED ORDER — MORPHINE SULFATE (PF) 4 MG/ML IV SOLN
4.0000 mg | Freq: Once | INTRAVENOUS | Status: AC
  Administered 2017-11-01: 4 mg via INTRAVENOUS
  Filled 2017-11-01: qty 1

## 2017-11-01 MED ORDER — IOPAMIDOL (ISOVUE-370) INJECTION 76%
100.0000 mL | Freq: Once | INTRAVENOUS | Status: AC | PRN
  Administered 2017-11-01: 100 mL via INTRAVENOUS

## 2017-11-01 MED ORDER — CLINDAMYCIN PHOSPHATE 600 MG/50ML IV SOLN
INTRAVENOUS | Status: AC
  Filled 2017-11-01: qty 50

## 2017-11-01 NOTE — ED Triage Notes (Addendum)
C/o left LE swelling x 3 days-denies injury-states she has an ongoing wound to heel-presents to triage in w/c

## 2017-11-01 NOTE — ED Notes (Signed)
Pt placed on BSC.
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
## 2017-11-01 NOTE — Discharge Instructions (Signed)
Please take all of your antibiotics until finished!   You may develop abdominal discomfort or diarrhea from the antibiotic.  You may help offset this with probiotics which you can buy or get in yogurt. Do not eat or take the probiotics until 2 hours after your antibiotic.   May take ibuprofen, naproxen, or Tylenol for pain.  May take your home narcotic pain medication for severe pain.  Follow-up with your primary care provider on this matter as soon as possible.

## 2017-11-01 NOTE — ED Notes (Signed)
Pt requesting for pain meds for home , Pa instructed pt to take meds vicodin that she already had a home

## 2017-11-01 NOTE — ED Notes (Signed)
Patient transported to X-ray 

## 2017-11-01 NOTE — ED Notes (Signed)
Blood Cx x2 obtained at this time.

## 2017-11-01 NOTE — ED Provider Notes (Signed)
Patient seen and evaluated. Discussed with Georga Kaufmann PA-C. Patient with cellulitis and early lymphangitis of left leg. Appropriate workup including Doppler ultrasound and CT and shrimp negative. Patient febrile. No leukocytosis. Tolerating symptoms well. Discuss possibilities of treatment including inpatient versus outpatient pH is a strong desire to not be admitted. I discussed with her that she should expect improvement within 72 hours and if not improving in bastard recheck here. Obviously return here immediately with worsening symptoms   Tanna Furry, MD 11/01/17 2342

## 2017-11-01 NOTE — ED Notes (Signed)
returned from CT

## 2017-11-01 NOTE — ED Notes (Signed)
Patient transported to CT 

## 2017-11-02 ENCOUNTER — Other Ambulatory Visit: Payer: Self-pay | Admitting: Family Medicine

## 2017-11-02 ENCOUNTER — Telehealth: Payer: Self-pay | Admitting: Family Medicine

## 2017-11-02 DIAGNOSIS — E11621 Type 2 diabetes mellitus with foot ulcer: Secondary | ICD-10-CM

## 2017-11-02 DIAGNOSIS — IMO0002 Reserved for concepts with insufficient information to code with codable children: Secondary | ICD-10-CM

## 2017-11-02 DIAGNOSIS — Z794 Long term (current) use of insulin: Principal | ICD-10-CM

## 2017-11-02 DIAGNOSIS — L97509 Non-pressure chronic ulcer of other part of unspecified foot with unspecified severity: Principal | ICD-10-CM

## 2017-11-02 DIAGNOSIS — E1165 Type 2 diabetes mellitus with hyperglycemia: Principal | ICD-10-CM

## 2017-11-02 MED ORDER — INSULIN GLARGINE 100 UNIT/ML SOLOSTAR PEN: PEN_INJECTOR | SUBCUTANEOUS | 0 refills | Status: DC

## 2017-11-02 NOTE — Telephone Encounter (Signed)
Copied from [place]. Topic: Quick Communication - Rx Refill/Question >> Nov 02, 2017 12:56 PM Cecelia Byars, NT wrote: Has the patient contacted their pharmacy? {yes  (Agent: If no, request that the patient contact the pharmacy for the refill Preferred Pharmacy (with phone number or street name):[facility] on [address]  Agent: Please be advised that RX refills may take up to 3 business days. We ask that you follow-up with your pharmacy. Needs refill for Lantus Solostar  injection

## 2017-11-05 ENCOUNTER — Inpatient Hospital Stay: Payer: PRIVATE HEALTH INSURANCE | Admitting: Family Medicine

## 2017-11-05 ENCOUNTER — Other Ambulatory Visit: Payer: Self-pay | Admitting: Family Medicine

## 2017-11-06 ENCOUNTER — Ambulatory Visit: Payer: PRIVATE HEALTH INSURANCE | Admitting: Family Medicine

## 2017-11-06 ENCOUNTER — Encounter: Admit: 2017-11-06 | Discharge: 2017-11-06 | Payer: MEDICARE

## 2017-11-06 ENCOUNTER — Ambulatory Visit: Admit: 2017-11-06 | Discharge: 2017-11-07 | Payer: MEDICARE

## 2017-11-06 DIAGNOSIS — E785 Hyperlipidemia, unspecified: ICD-10-CM

## 2017-11-06 DIAGNOSIS — J309 Allergic rhinitis, unspecified: ICD-10-CM

## 2017-11-06 DIAGNOSIS — E119 Type 2 diabetes mellitus without complications: ICD-10-CM

## 2017-11-06 DIAGNOSIS — E1129 Type 2 diabetes mellitus with other diabetic kidney complication: Principal | ICD-10-CM

## 2017-11-06 DIAGNOSIS — J45909 Unspecified asthma, uncomplicated: ICD-10-CM

## 2017-11-06 DIAGNOSIS — K589 Irritable bowel syndrome without diarrhea: ICD-10-CM

## 2017-11-06 DIAGNOSIS — I10 Essential (primary) hypertension: ICD-10-CM

## 2017-11-06 DIAGNOSIS — M79642 Pain in left hand: ICD-10-CM

## 2017-11-06 DIAGNOSIS — E559 Vitamin D deficiency, unspecified: ICD-10-CM

## 2017-11-06 DIAGNOSIS — K2970 Gastritis, unspecified, without bleeding: Principal | ICD-10-CM

## 2017-11-06 DIAGNOSIS — K5900 Constipation, unspecified: ICD-10-CM

## 2017-11-06 DIAGNOSIS — D649 Anemia, unspecified: ICD-10-CM

## 2017-11-06 DIAGNOSIS — E162 Hypoglycemia, unspecified: ICD-10-CM

## 2017-11-06 DIAGNOSIS — G5600 Carpal tunnel syndrome, unspecified upper limb: ICD-10-CM

## 2017-11-06 DIAGNOSIS — G9050 Complex regional pain syndrome I, unspecified: ICD-10-CM

## 2017-11-07 ENCOUNTER — Other Ambulatory Visit: Payer: Self-pay | Admitting: Family Medicine

## 2017-11-08 ENCOUNTER — Ambulatory Visit (INDEPENDENT_AMBULATORY_CARE_PROVIDER_SITE_OTHER): Payer: PRIVATE HEALTH INSURANCE

## 2017-11-08 ENCOUNTER — Encounter (INDEPENDENT_AMBULATORY_CARE_PROVIDER_SITE_OTHER): Payer: Self-pay | Admitting: Orthopedic Surgery

## 2017-11-08 ENCOUNTER — Ambulatory Visit (INDEPENDENT_AMBULATORY_CARE_PROVIDER_SITE_OTHER): Payer: PRIVATE HEALTH INSURANCE | Admitting: Orthopedic Surgery

## 2017-11-08 DIAGNOSIS — M25571 Pain in right ankle and joints of right foot: Secondary | ICD-10-CM

## 2017-11-08 DIAGNOSIS — L97421 Non-pressure chronic ulcer of left heel and midfoot limited to breakdown of skin: Secondary | ICD-10-CM

## 2017-11-08 NOTE — Telephone Encounter (Signed)
Copied from [place] [phone]. Topic: General - Other >> Nov 08, 2017  1:00 PM Neva Seat wrote: Lyrica   Pt has called pharmacy since Mon and there has not been a refill to pick up.  [place], [place] [phone]

## 2017-11-08 NOTE — Telephone Encounter (Signed)
Refill request Lyrica

## 2017-11-08 NOTE — Telephone Encounter (Signed)
Called into pharmacy

## 2017-11-08 NOTE — Progress Notes (Deleted)
x
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
## 2017-11-12 ENCOUNTER — Ambulatory Visit (INDEPENDENT_AMBULATORY_CARE_PROVIDER_SITE_OTHER): Payer: PRIVATE HEALTH INSURANCE | Admitting: Orthopedic Surgery

## 2017-11-15 ENCOUNTER — Ambulatory Visit (INDEPENDENT_AMBULATORY_CARE_PROVIDER_SITE_OTHER): Payer: PRIVATE HEALTH INSURANCE | Admitting: Family Medicine

## 2017-11-15 ENCOUNTER — Inpatient Hospital Stay: Payer: PRIVATE HEALTH INSURANCE | Admitting: Family Medicine

## 2017-11-15 ENCOUNTER — Encounter: Payer: Self-pay | Admitting: Family Medicine

## 2017-11-15 VITALS — BP 120/60 | HR 82 | Temp 97.9°F | Resp 16

## 2017-11-15 DIAGNOSIS — E785 Hyperlipidemia, unspecified: Secondary | ICD-10-CM

## 2017-11-15 DIAGNOSIS — E1139 Type 2 diabetes mellitus with other diabetic ophthalmic complication: Secondary | ICD-10-CM | POA: Diagnosis not present

## 2017-11-15 DIAGNOSIS — M79672 Pain in left foot: Secondary | ICD-10-CM

## 2017-11-15 DIAGNOSIS — L089 Local infection of the skin and subcutaneous tissue, unspecified: Secondary | ICD-10-CM | POA: Diagnosis not present

## 2017-11-15 DIAGNOSIS — G4730 Sleep apnea, unspecified: Secondary | ICD-10-CM

## 2017-11-15 DIAGNOSIS — I2510 Atherosclerotic heart disease of native coronary artery without angina pectoris: Secondary | ICD-10-CM

## 2017-11-15 DIAGNOSIS — R319 Hematuria, unspecified: Secondary | ICD-10-CM | POA: Diagnosis not present

## 2017-11-15 DIAGNOSIS — E11628 Type 2 diabetes mellitus with other skin complications: Secondary | ICD-10-CM | POA: Diagnosis not present

## 2017-11-15 DIAGNOSIS — M797 Fibromyalgia: Secondary | ICD-10-CM | POA: Diagnosis not present

## 2017-11-15 DIAGNOSIS — E1165 Type 2 diabetes mellitus with hyperglycemia: Secondary | ICD-10-CM

## 2017-11-15 DIAGNOSIS — IMO0002 Reserved for concepts with insufficient information to code with codable children: Secondary | ICD-10-CM

## 2017-11-15 DIAGNOSIS — I10 Essential (primary) hypertension: Secondary | ICD-10-CM | POA: Diagnosis not present

## 2017-11-15 DIAGNOSIS — N183 Chronic kidney disease, stage 3 unspecified: Secondary | ICD-10-CM

## 2017-11-15 LAB — COMPREHENSIVE METABOLIC PANEL
ALT: 17 U/L (ref 0–35)
AST: 24 U/L (ref 0–37)
Albumin: 3.7 g/dL (ref 3.5–5.2)
Alkaline Phosphatase: 80 U/L (ref 39–117)
BUN: 28 mg/dL — ABNORMAL HIGH (ref 6–23)
CALCIUM: 9.5 mg/dL (ref 8.4–10.5)
CHLORIDE: 106 meq/L (ref 96–112)
CO2: 27 meq/L (ref 19–32)
Creatinine, Ser: 1.52 mg/dL — ABNORMAL HIGH (ref 0.40–1.20)
GFR: 36.85 mL/min — AB (ref >60.00)
GLUCOSE: 164 mg/dL — AB (ref 70–99)
POTASSIUM: 4.9 meq/L (ref 3.5–5.1)
Sodium: 139 mEq/L (ref 135–145)
Total Bilirubin: 0.5 mg/dL (ref 0.2–1.2)
Total Protein: 7.8 g/dL (ref 6.0–8.3)

## 2017-11-15 LAB — POC URINALSYSI DIPSTICK (AUTOMATED)
BILIRUBIN UA: NEGATIVE
GLUCOSE UA: NEGATIVE
Ketones, UA: NEGATIVE
LEUKOCYTES UA: NEGATIVE
NITRITE UA: NEGATIVE
Protein, UA: NEGATIVE
Spec Grav, UA: 1.02 (ref 1.010–1.025)
UROBILINOGEN UA: 0.2 U/dL
pH, UA: 6 (ref 5.0–8.0)

## 2017-11-15 LAB — LIPID PANEL
Cholesterol: 106 mg/dL (ref 0–200)
HDL: 33 mg/dL — ABNORMAL LOW (ref >39.00)
LDL Cholesterol: 52 mg/dL (ref 0–99)
NONHDL: 73.3
Total CHOL/HDL Ratio: 3
Triglycerides: 106 mg/dL (ref 0.0–149.0)
VLDL: 21.2 mg/dL (ref 0.0–40.0)

## 2017-11-15 LAB — MICROALBUMIN / CREATININE URINE RATIO
CREATININE, U: 60.8 mg/dL
MICROALB UR: 8.9 mg/dL — AB (ref 0.0–1.9)
MICROALB/CREAT RATIO: 14.7 mg/g (ref 0.0–30.0)

## 2017-11-15 LAB — HEMOGLOBIN A1C: Hgb A1c MFr Bld: 8.6 % — ABNORMAL HIGH (ref 4.6–6.5)

## 2017-11-15 MED ORDER — HYDROCODONE-ACETAMINOPHEN 5-325 MG PO TABS: ORAL_TABLET | ORAL | 0 refills | Status: DC

## 2017-11-15 MED ORDER — HYDROCODONE-ACETAMINOPHEN 5-325 MG PO TABS: 1.0000 | ORAL_TABLET | Freq: Four times a day (QID) | ORAL | 0 refills | Status: DC | PRN

## 2017-11-15 NOTE — Assessment & Plan Note (Signed)
Tolerating statin, encouraged heart healthy diet, avoid trans fats, minimize simple carbs and saturated fats. Increase exercise as tolerated 

## 2017-11-15 NOTE — Assessment & Plan Note (Signed)
Per ortho

## 2017-11-15 NOTE — Assessment & Plan Note (Signed)
Stable Check labs
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
## 2017-11-15 NOTE — Patient Instructions (Signed)
Cellulitis, Adult Cellulitis is a skin infection. The infected area is usually red and tender. This condition occurs most often in the arms and lower legs. The infection can travel to the muscles, blood, and underlying tissue and become serious. It is very important to get treated for this condition. What are the causes? Cellulitis is caused by bacteria. The bacteria enter through a break in the skin, such as a cut, burn, insect bite, open sore, or crack. What increases the risk? This condition is more likely to occur in people who:  Have a weak defense system (immune system).  Have open wounds on the skin such as cuts, burns, bites, and scrapes. Bacteria can enter the body through these open wounds.  Are older.  Have diabetes.  Have a type of long-lasting (chronic) liver disease (cirrhosis) or kidney disease.  Use IV drugs.  What are the signs or symptoms? Symptoms of this condition include:  Redness, streaking, or spotting on the skin.  Swollen area of the skin.  Tenderness or pain when an area of the skin is touched.  Warm skin.  Fever.  Chills.  Blisters.  How is this diagnosed? This condition is diagnosed based on a medical history and physical exam. You may also have tests, including:  Blood tests.  Lab tests.  Imaging tests.  How is this treated? Treatment for this condition may include:  Medicines, such as antibiotic medicines or antihistamines.  Supportive care, such as rest and application of cold or warm cloths (cold or warm compresses) to the skin.  Hospital care, if the condition is severe.  The infection usually gets better within 1-2 days of treatment. Follow these instructions at home:  Take over-the-counter and prescription medicines only as told by your health care provider.  If you were prescribed an antibiotic medicine, take it as told by your health care provider. Do not stop taking the antibiotic even if you start to feel  better.  Drink enough fluid to keep your urine clear or pale yellow.  Do not touch or rub the infected area.  Raise (elevate) the infected area above the level of your heart while you are sitting or lying down.  Apply warm or cold compresses to the affected area as told by your health care provider.  Keep all follow-up visits as told by your health care provider. This is important. These visits let your health care provider make sure a more serious infection is not developing. Contact a health care provider if:  You have a fever.  Your symptoms do not improve within 1-2 days of starting treatment.  Your bone or joint underneath the infected area becomes painful after the skin has healed.  Your infection returns in the same area or another area.  You notice a swollen bump in the infected area.  You develop new symptoms.  You have a general ill feeling (malaise) with muscle aches and pains. Get help right away if:  Your symptoms get worse.  You feel very sleepy.  You develop vomiting or diarrhea that persists.  You notice red streaks coming from the infected area.  Your red area gets larger or turns dark in color. This information is not intended to replace advice given to you by your health care provider. Make sure you discuss any questions you have with your health care provider. Document Released: 07/26/2005 Document Revised: 02/24/2016 Document Reviewed: 08/25/2015 Elsevier Interactive Patient Education  2018 [employer]  

## 2017-11-15 NOTE — Assessment & Plan Note (Signed)
Check labs 

## 2017-11-15 NOTE — Assessment & Plan Note (Signed)
Well controlled, no changes to meds. Encouraged heart healthy diet such as the DASH diet and exercise as tolerated.  °

## 2017-11-16 DIAGNOSIS — G4730 Sleep apnea, unspecified: Secondary | ICD-10-CM

## 2017-11-16 HISTORY — DX: Sleep apnea, unspecified: G47.30

## 2017-11-16 LAB — URINE CULTURE
MICRO NUMBER:: 90072429
SPECIMEN QUALITY:: ADEQUATE

## 2017-11-16 NOTE — Addendum Note (Signed)
Addended by: [name] R on: 11/16/2017 12:05 PM   Modules accepted: Orders

## 2017-11-16 NOTE — Assessment & Plan Note (Signed)
Pt was told by anaesthesia for last surgery that she had sleep apnea and needed a sleep study

## 2017-12-06 ENCOUNTER — Encounter: Admit: 2017-12-06 | Discharge: 2017-12-06 | Payer: MEDICARE

## 2017-12-06 MED ORDER — PANTOPRAZOLE 40 MG PO TBEC
ORAL_TABLET | Freq: Every day | ORAL | 0 refills | 90.00000 days | Status: AC
Start: 2017-12-06 — End: 2018-03-13

## 2017-12-07 ENCOUNTER — Other Ambulatory Visit: Payer: Self-pay | Admitting: Family Medicine

## 2017-12-07 DIAGNOSIS — E11621 Type 2 diabetes mellitus with foot ulcer: Secondary | ICD-10-CM

## 2017-12-07 DIAGNOSIS — L97509 Non-pressure chronic ulcer of other part of unspecified foot with unspecified severity: Principal | ICD-10-CM

## 2017-12-07 DIAGNOSIS — Z794 Long term (current) use of insulin: Principal | ICD-10-CM

## 2017-12-07 DIAGNOSIS — IMO0002 Reserved for concepts with insufficient information to code with codable children: Secondary | ICD-10-CM

## 2017-12-07 DIAGNOSIS — E1165 Type 2 diabetes mellitus with hyperglycemia: Principal | ICD-10-CM

## 2017-12-10 ENCOUNTER — Ambulatory Visit (INDEPENDENT_AMBULATORY_CARE_PROVIDER_SITE_OTHER): Payer: PRIVATE HEALTH INSURANCE | Admitting: Orthopedic Surgery

## 2017-12-10 ENCOUNTER — Encounter (INDEPENDENT_AMBULATORY_CARE_PROVIDER_SITE_OTHER): Payer: Self-pay | Admitting: Orthopedic Surgery

## 2017-12-10 VITALS — Ht 62.0 in | Wt 182.0 lb

## 2017-12-10 DIAGNOSIS — L97421 Non-pressure chronic ulcer of left heel and midfoot limited to breakdown of skin: Secondary | ICD-10-CM | POA: Diagnosis not present

## 2017-12-10 DIAGNOSIS — E1142 Type 2 diabetes mellitus with diabetic polyneuropathy: Secondary | ICD-10-CM

## 2017-12-10 MED ORDER — DOXYCYCLINE HYCLATE 100 MG PO TABS: 100.0000 mg | ORAL_TABLET | Freq: Two times a day (BID) | ORAL | 0 refills | Status: DC
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
## 2017-12-18 ENCOUNTER — Ambulatory Visit: Payer: PRIVATE HEALTH INSURANCE | Admitting: Family Medicine

## 2017-12-20 ENCOUNTER — Institutional Professional Consult (permissible substitution): Payer: PRIVATE HEALTH INSURANCE | Admitting: Neurology

## 2017-12-24 ENCOUNTER — Other Ambulatory Visit: Payer: Self-pay | Admitting: Family Medicine

## 2017-12-24 ENCOUNTER — Encounter: Payer: Self-pay | Admitting: Neurology

## 2017-12-25 NOTE — Telephone Encounter (Signed)
[facility] [address] requesting refill on lyrica  Database ran and is on your desk for review.  Last filled per database: 11/08/17 Last written: 11/08/17 Last ov: 11/15/17 Next ov: none Contract: 08/16/18 UDS: 02/14/18

## 2018-01-03 ENCOUNTER — Encounter (INDEPENDENT_AMBULATORY_CARE_PROVIDER_SITE_OTHER): Payer: Self-pay | Admitting: Orthopedic Surgery

## 2018-01-03 ENCOUNTER — Ambulatory Visit (INDEPENDENT_AMBULATORY_CARE_PROVIDER_SITE_OTHER): Payer: PRIVATE HEALTH INSURANCE | Admitting: Orthopedic Surgery

## 2018-01-03 VITALS — Ht 62.0 in | Wt 182.0 lb

## 2018-01-03 DIAGNOSIS — L97421 Non-pressure chronic ulcer of left heel and midfoot limited to breakdown of skin: Secondary | ICD-10-CM

## 2018-01-03 DIAGNOSIS — E1142 Type 2 diabetes mellitus with diabetic polyneuropathy: Secondary | ICD-10-CM
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
## 2018-01-04 ENCOUNTER — Other Ambulatory Visit: Payer: Self-pay | Admitting: Family Medicine

## 2018-01-04 DIAGNOSIS — E11621 Type 2 diabetes mellitus with foot ulcer: Secondary | ICD-10-CM

## 2018-01-04 DIAGNOSIS — IMO0002 Reserved for concepts with insufficient information to code with codable children: Secondary | ICD-10-CM

## 2018-01-04 DIAGNOSIS — E1165 Type 2 diabetes mellitus with hyperglycemia: Principal | ICD-10-CM

## 2018-01-04 DIAGNOSIS — L97509 Non-pressure chronic ulcer of other part of unspecified foot with unspecified severity: Principal | ICD-10-CM

## 2018-01-04 DIAGNOSIS — Z794 Long term (current) use of insulin: Principal | ICD-10-CM

## 2018-01-08 ENCOUNTER — Encounter: Payer: Self-pay | Admitting: Family Medicine

## 2018-01-08 ENCOUNTER — Ambulatory Visit (INDEPENDENT_AMBULATORY_CARE_PROVIDER_SITE_OTHER): Payer: PRIVATE HEALTH INSURANCE | Admitting: Family Medicine

## 2018-01-08 VITALS — BP 118/60 | HR 75 | Temp 97.9°F | Resp 16 | Ht 61.81 in | Wt 191.6 lb

## 2018-01-08 DIAGNOSIS — M79672 Pain in left foot: Secondary | ICD-10-CM | POA: Diagnosis not present

## 2018-01-08 DIAGNOSIS — J302 Other seasonal allergic rhinitis: Secondary | ICD-10-CM

## 2018-01-08 DIAGNOSIS — F419 Anxiety disorder, unspecified: Secondary | ICD-10-CM

## 2018-01-08 DIAGNOSIS — M797 Fibromyalgia: Secondary | ICD-10-CM

## 2018-01-08 MED ORDER — MONTELUKAST SODIUM 10 MG PO TABS: 10.0000 mg | ORAL_TABLET | Freq: Every day | ORAL | 3 refills | Status: DC

## 2018-01-08 MED ORDER — FLUTICASONE PROPIONATE 50 MCG/ACT NA SUSP: 2.0000 | Freq: Every day | NASAL | 6 refills | Status: DC

## 2018-01-08 MED ORDER — DULOXETINE HCL 60 MG PO CPEP: 60.0000 mg | ORAL_CAPSULE | Freq: Every day | ORAL | 5 refills | Status: DC

## 2018-01-08 MED ORDER — LEVOCETIRIZINE DIHYDROCHLORIDE 5 MG PO TABS: 5.0000 mg | ORAL_TABLET | Freq: Every evening | ORAL | 5 refills | Status: DC

## 2018-01-08 MED ORDER — HYDROCODONE-ACETAMINOPHEN 5-325 MG PO TABS
ORAL_TABLET | ORAL | 0 refills | Status: DC
Start: 2018-01-08 — End: 2018-02-08

## 2018-01-08 NOTE — Patient Instructions (Signed)
Allergies, Adult An allergy is when your body's defense system (immune system) overreacts to an otherwise harmless substance (allergen) that you breathe in or eat or something that touches your skin. When you come into contact with something that you are allergic to, your immune system produces certain proteins (antibodies). These proteins cause cells to release chemicals (histamines) that trigger the symptoms of an allergic reaction. Allergies often affect the nasal passages (allergic rhinitis), eyes (allergic conjunctivitis), skin (atopic dermatitis), and stomach. Allergies can be mild or severe. Allergies cannot spread from person to person (are not contagious). They can develop at any age and may be outgrown. What increases the risk? You may be at greater risk of allergies if other people in your family have allergies. What are the signs or symptoms? Symptoms depend on what type of allergy you have. They may include:  Runny, stuffy nose.  Sneezing.  Itchy mouth, ears, or throat.  Postnasal drip.  Sore throat.  Itchy, red, watery, or puffy eyes.  Skin rash or hives.  Stomach pain.  Vomiting.  Diarrhea.  Bloating.  Wheezing or coughing.  People with a severe allergy to food, medicine, or an insect bite may have a life-threatening allergic reaction (anaphylaxis). Symptoms of anaphylaxis include:  Hives.  Itching.  Flushed face.  Swollen lips, tongue, or mouth.  Tight or swollen throat.  Chest pain or tightness in the chest.  Trouble breathing or shortness of breath.  Rapid heartbeat.  Dizziness or fainting.  Vomiting.  Diarrhea.  Pain in the abdomen.  How is this diagnosed? This condition is diagnosed based on:  Your symptoms.  Your family and medical history.  A physical exam.  You may need to see a health care provider who specializes in treating allergies (allergist). You may also have tests, including:  Skin tests to see which allergens are  causing your symptoms, such as: ? Skin prick test. In this test, your skin is pricked with a tiny needle and exposed to small amounts of possible allergens to see if your skin reacts. ? Intradermal skin test. In this test, a small amount of allergen is injected under your skin to see if your skin reacts. ? Patch test. In this test, a small amount of allergen is placed on your skin and then your skin is covered with a bandage. Your health care provider will check your skin after a couple of days to see if a rash has developed.  Blood tests.  Challenges tests. In this test, you inhale a small amount of allergen by mouth to see if you have an allergic reaction.  You may also be asked to:  Keep a food diary. A food diary is a record of all the foods and drinks you have in a day and any symptoms you experience.  Practice an elimination diet. An elimination diet involves eliminating specific foods from your diet and then adding them back in one by one to find out if a certain food causes an allergic reaction.  How is this treated? Treatment for allergies depends on your symptoms. Treatment may include:  Cold compresses to soothe itching and swelling.  Eye drops.  Nasal sprays.  Using a saline spray or container (neti pot) to flush out the nose (nasal irrigation). These methods can help clear away mucus and keep the nasal passages moist.  Using a humidifier.  Oral antihistamines or other medicines to block allergic reaction and inflammation.  Skin creams to treat rashes or itching.  Diet changes to  eliminate food allergy triggers.  Repeated exposure to tiny amounts of allergens to build up a tolerance and prevent future allergic reactions (immunotherapy). These include: ? Allergy shots. ? Oral treatment. This involves taking small doses of an allergen under the tongue (sublingual immunotherapy).  Emergency epinephrine injection (auto-injector) in case of an allergic emergency. This is  a self-injectable, pre-measured medicine that must be given within the first few minutes of a serious allergic reaction.  Follow these instructions at home:  Avoid known allergens whenever possible.  If you suffer from airborne allergens, wash out your nose daily. You can do this with a saline spray or a neti pot to flush out your nose (nasal irrigation).  Take over-the-counter and prescription medicines only as told by your health care provider.  Keep all follow-up visits as told by your health care provider. This is important.  If you are at risk of a severe allergic reaction (anaphylaxis), keep your auto-injector with you at all times.  If you have ever had anaphylaxis, wear a medical alert bracelet or necklace that states you have a severe allergy. Contact a health care provider if:  Your symptoms do not improve with treatment. Get help right away if:  You have symptoms of anaphylaxis, such as: ? Swollen mouth, tongue, or throat. ? Pain or tightness in your chest. ? Trouble breathing or shortness of breath. ? Dizziness or fainting. ? Severe abdominal pain, vomiting, or diarrhea. This information is not intended to replace advice given to you by your health care provider. Make sure you discuss any questions you have with your health care provider. Document Released: 01/09/2003 Document Revised: 02/14/2017 Document Reviewed: 05/03/2016 Elsevier Interactive Patient Education  [employer].
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
## 2018-01-24 ENCOUNTER — Ambulatory Visit (INDEPENDENT_AMBULATORY_CARE_PROVIDER_SITE_OTHER): Payer: PRIVATE HEALTH INSURANCE | Admitting: Orthopedic Surgery

## 2018-01-26 ENCOUNTER — Other Ambulatory Visit: Payer: Self-pay | Admitting: Family Medicine

## 2018-01-26 DIAGNOSIS — F32A Depression, unspecified: Secondary | ICD-10-CM

## 2018-01-26 DIAGNOSIS — E785 Hyperlipidemia, unspecified: Secondary | ICD-10-CM

## 2018-01-26 DIAGNOSIS — F329 Major depressive disorder, single episode, unspecified: Secondary | ICD-10-CM

## 2018-02-04 ENCOUNTER — Ambulatory Visit (INDEPENDENT_AMBULATORY_CARE_PROVIDER_SITE_OTHER): Payer: PRIVATE HEALTH INSURANCE | Admitting: Orthopedic Surgery

## 2018-02-04 ENCOUNTER — Encounter (INDEPENDENT_AMBULATORY_CARE_PROVIDER_SITE_OTHER): Payer: Self-pay | Admitting: Orthopedic Surgery

## 2018-02-04 VITALS — Ht 61.0 in | Wt 191.0 lb

## 2018-02-04 DIAGNOSIS — E1142 Type 2 diabetes mellitus with diabetic polyneuropathy: Secondary | ICD-10-CM | POA: Diagnosis not present

## 2018-02-04 DIAGNOSIS — L97521 Non-pressure chronic ulcer of other part of left foot limited to breakdown of skin: Secondary | ICD-10-CM | POA: Diagnosis not present

## 2018-02-04 DIAGNOSIS — L97421 Non-pressure chronic ulcer of left heel and midfoot limited to breakdown of skin: Secondary | ICD-10-CM | POA: Diagnosis not present

## 2018-02-04 MED ORDER — SILVER SULFADIAZINE 1 % EX CREA: 1.0000 "application " | TOPICAL_CREAM | Freq: Every day | CUTANEOUS | 3 refills | Status: DC
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
## 2018-02-05 ENCOUNTER — Encounter: Admit: 2018-02-05 | Discharge: 2018-02-05 | Payer: MEDICARE

## 2018-02-05 MED ORDER — METFORMIN 1,000 MG PO TAB
ORAL_TABLET | Freq: Two times a day (BID) | 0 refills | Status: AC
Start: 2018-02-05 — End: 2018-03-13

## 2018-02-07 ENCOUNTER — Other Ambulatory Visit: Payer: Self-pay | Admitting: Family Medicine

## 2018-02-07 ENCOUNTER — Telehealth: Payer: Self-pay | Admitting: Family Medicine

## 2018-02-07 DIAGNOSIS — I10 Essential (primary) hypertension: Secondary | ICD-10-CM

## 2018-02-07 NOTE — Telephone Encounter (Signed)
Copied from [place] [id number]. Topic: Quick Communication - Rx Refill/Question >> Feb 07, 2018 12:31 PM Yvette Rack wrote: Medication: HYDROcodone-acetaminophen (NORCO/VICODIN) 5-325 MG tablet   for 3 months Has the patient contacted their pharmacy? yes (Agent: If no, request that the patient contact the pharmacy for the refill.) Preferred Pharmacy (with phone number or street name):     [place], [place] [phone] (Phone) [phone] (Fax)     Agent: Please be advised that RX refills may take up to 3 business days. We ask that you follow-up with your pharmacy.

## 2018-02-08 ENCOUNTER — Other Ambulatory Visit: Payer: Self-pay | Admitting: Family Medicine

## 2018-02-08 DIAGNOSIS — E11621 Type 2 diabetes mellitus with foot ulcer: Secondary | ICD-10-CM

## 2018-02-08 DIAGNOSIS — M79672 Pain in left foot: Secondary | ICD-10-CM

## 2018-02-08 DIAGNOSIS — IMO0002 Reserved for concepts with insufficient information to code with codable children: Secondary | ICD-10-CM

## 2018-02-08 DIAGNOSIS — Z794 Long term (current) use of insulin: Principal | ICD-10-CM

## 2018-02-08 DIAGNOSIS — L97509 Non-pressure chronic ulcer of other part of unspecified foot with unspecified severity: Principal | ICD-10-CM

## 2018-02-08 DIAGNOSIS — M797 Fibromyalgia: Secondary | ICD-10-CM

## 2018-02-08 DIAGNOSIS — E1165 Type 2 diabetes mellitus with hyperglycemia: Principal | ICD-10-CM

## 2018-02-08 MED ORDER — HYDROCODONE-ACETAMINOPHEN 5-325 MG PO TABS: 1.0000 | ORAL_TABLET | Freq: Four times a day (QID) | ORAL | 0 refills | Status: DC | PRN

## 2018-02-08 MED ORDER — HYDROCODONE-ACETAMINOPHEN 5-325 MG PO TABS: ORAL_TABLET | ORAL | 0 refills | Status: DC

## 2018-02-08 NOTE — Telephone Encounter (Signed)
Patient requesting hydrocodone  Database ran 12/25/17 and is in media for review   Last written: 01/08/18 Last ov: 01/08/18 Next ov: none Contract: 08/16/18 UDS: 02/14/18

## 2018-02-08 NOTE — Telephone Encounter (Signed)
Sent in

## 2018-02-08 NOTE — Telephone Encounter (Signed)
Patient notified that rx has been sent in. 

## 2018-02-11 NOTE — Telephone Encounter (Signed)
[facility] [address] requesting refill on lyrica  Database ran 12/25/17 and is in media for review   Last written: 12/25/17 Last ov: 01/08/18 Next ov: none Contract: 08/16/18 UDS: 02/14/18

## 2018-02-21 ENCOUNTER — Ambulatory Visit: Admit: 2018-02-21 | Discharge: 2018-02-22 | Payer: MEDICARE

## 2018-02-21 ENCOUNTER — Encounter: Admit: 2018-02-21 | Discharge: 2018-02-21 | Payer: MEDICARE

## 2018-02-21 DIAGNOSIS — K2970 Gastritis, unspecified, without bleeding: Principal | ICD-10-CM

## 2018-02-21 DIAGNOSIS — E785 Hyperlipidemia, unspecified: ICD-10-CM

## 2018-02-21 DIAGNOSIS — D649 Anemia, unspecified: ICD-10-CM

## 2018-02-21 DIAGNOSIS — R809 Proteinuria, unspecified: ICD-10-CM

## 2018-02-21 DIAGNOSIS — R5383 Other fatigue: ICD-10-CM

## 2018-02-21 DIAGNOSIS — J309 Allergic rhinitis, unspecified: ICD-10-CM

## 2018-02-21 DIAGNOSIS — G5600 Carpal tunnel syndrome, unspecified upper limb: ICD-10-CM

## 2018-02-21 DIAGNOSIS — E1129 Type 2 diabetes mellitus with other diabetic kidney complication: ICD-10-CM

## 2018-02-21 DIAGNOSIS — K219 Gastro-esophageal reflux disease without esophagitis: ICD-10-CM

## 2018-02-21 DIAGNOSIS — G9050 Complex regional pain syndrome I, unspecified: ICD-10-CM

## 2018-02-21 DIAGNOSIS — E559 Vitamin D deficiency, unspecified: ICD-10-CM

## 2018-02-21 DIAGNOSIS — Z0000 Encounter for general adult medical examination without abnormal findings: Principal | ICD-10-CM

## 2018-02-21 DIAGNOSIS — E119 Type 2 diabetes mellitus without complications: ICD-10-CM

## 2018-02-21 DIAGNOSIS — K580 Irritable bowel syndrome with diarrhea: ICD-10-CM

## 2018-02-21 DIAGNOSIS — I10 Essential (primary) hypertension: ICD-10-CM

## 2018-02-21 DIAGNOSIS — E538 Deficiency of other specified B group vitamins: ICD-10-CM

## 2018-02-21 DIAGNOSIS — M79642 Pain in left hand: ICD-10-CM

## 2018-02-21 DIAGNOSIS — K5900 Constipation, unspecified: ICD-10-CM

## 2018-02-21 DIAGNOSIS — K589 Irritable bowel syndrome without diarrhea: ICD-10-CM

## 2018-02-21 DIAGNOSIS — J45909 Unspecified asthma, uncomplicated: ICD-10-CM

## 2018-02-22 LAB — URINALYSIS DIPSTICK

## 2018-02-22 LAB — FERRITIN: Lab: 14 ng/mL — ABNORMAL LOW (ref 20–288)

## 2018-02-22 LAB — CBC AND DIFF
Lab: 0.5 %
Lab: 1.8 %
Lab: 10 fL (ref 7.5–12.5)
Lab: 178 {cells}/uL (ref 15–500)
Lab: 18 % — ABNORMAL HIGH (ref 11.0–15.0)
Lab: 20 % (ref 10–35)
Lab: 30 % — ABNORMAL LOW (ref >=60)
Lab: 32 g/dL (ref 32.0–36.0)
Lab: 5.1 % (ref 6–29)
Lab: 50 {cells}/uL (ref 0–200)
Lab: 505 {cells}/uL (ref 200–950)
Lab: 648 Thousand/uL — ABNORMAL HIGH (ref 140–400)
Lab: 709 {cells}/uL (ref 1500–7800)
Lab: 71 % (ref 33–130)
Lab: 73 fL — ABNORMAL LOW (ref 80.0–100.0)
Lab: 9.7 g/dL — ABNORMAL LOW (ref >=60)
Lab: 9.9 10*3/uL (ref <150)

## 2018-02-22 LAB — FOLATE, SERUM: Lab: 7.9 ng/mL — ABNORMAL HIGH (ref 3.80–5.10)

## 2018-02-22 LAB — HEMOGLOBIN A1C: Lab: 7.8 %{Hb} — ABNORMAL HIGH (ref <5.7)

## 2018-02-22 LAB — LIPID PROFILE: Lab: 223 mg/dL — ABNORMAL HIGH (ref <200)

## 2018-02-22 LAB — COMPREHENSIVE METABOLIC PANEL: Lab: 146 mg/dL — ABNORMAL HIGH (ref >50)

## 2018-02-22 LAB — 25-OH VITAMIN D (D2 + D3): Lab: 34 ng/mL (ref 30–100)

## 2018-02-22 LAB — THYROID STIMULATING HORMONE-TSH: Lab: 0.8 m[IU]/L (ref <30)

## 2018-02-22 LAB — MICROALB/CR RATIO-URINE RANDOM: Lab: 86 mg/dL (ref 20–275)

## 2018-02-22 LAB — VITAMIN B12: Lab: 624 pg/mL (ref 200–1100)

## 2018-02-26 ENCOUNTER — Encounter: Admit: 2018-02-26 | Discharge: 2018-02-26 | Payer: MEDICARE

## 2018-02-26 DIAGNOSIS — D649 Anemia, unspecified: Principal | ICD-10-CM

## 2018-03-04 ENCOUNTER — Ambulatory Visit: Payer: PRIVATE HEALTH INSURANCE | Admitting: Family Medicine

## 2018-03-04 DIAGNOSIS — Z0289 Encounter for other administrative examinations: Secondary | ICD-10-CM

## 2018-03-06 ENCOUNTER — Ambulatory Visit (INDEPENDENT_AMBULATORY_CARE_PROVIDER_SITE_OTHER): Payer: PRIVATE HEALTH INSURANCE | Admitting: Orthopedic Surgery

## 2018-03-07 ENCOUNTER — Ambulatory Visit (INDEPENDENT_AMBULATORY_CARE_PROVIDER_SITE_OTHER): Payer: PRIVATE HEALTH INSURANCE | Admitting: Family Medicine

## 2018-03-07 ENCOUNTER — Encounter: Payer: Self-pay | Admitting: Family Medicine

## 2018-03-07 ENCOUNTER — Encounter: Admit: 2018-03-07 | Discharge: 2018-03-07 | Payer: MEDICARE

## 2018-03-07 VITALS — BP 110/58 | HR 81 | Temp 99.1°F | Resp 16 | Ht 61.0 in | Wt 192.2 lb

## 2018-03-07 DIAGNOSIS — E1165 Type 2 diabetes mellitus with hyperglycemia: Secondary | ICD-10-CM

## 2018-03-07 DIAGNOSIS — Z794 Long term (current) use of insulin: Secondary | ICD-10-CM

## 2018-03-07 DIAGNOSIS — J0100 Acute maxillary sinusitis, unspecified: Secondary | ICD-10-CM

## 2018-03-07 DIAGNOSIS — I10 Essential (primary) hypertension: Secondary | ICD-10-CM | POA: Diagnosis not present

## 2018-03-07 DIAGNOSIS — J324 Chronic pansinusitis: Secondary | ICD-10-CM | POA: Diagnosis not present

## 2018-03-07 DIAGNOSIS — K219 Gastro-esophageal reflux disease without esophagitis: Principal | ICD-10-CM

## 2018-03-07 MED ORDER — CLARITHROMYCIN ER 500 MG PO TB24: 1000.0000 mg | ORAL_TABLET | Freq: Every day | ORAL | 0 refills | Status: AC

## 2018-03-07 NOTE — Assessment & Plan Note (Signed)
biaxin Pt told to hold statin while on biaxin

## 2018-03-07 NOTE — Assessment & Plan Note (Signed)
Well controlled, no changes to meds. Encouraged heart healthy diet such as the DASH diet and exercise as tolerated.  °

## 2018-03-07 NOTE — Assessment & Plan Note (Signed)
hgba1c to be checked, minimize simple carbs. Increase exercise as tolerated. Continue current meds Per endo

## 2018-03-07 NOTE — Progress Notes (Signed)
Patient ID: [name], female    DOB: 04-08-1956  Age: 62 y.o. MRN: [id number]    Subjective:  Subjective  HPI [name] [name] presents for f/u and c/o sinus problems and bp has been running high at home.  She has been itching and sneezing a lot and has had a runny nose.   + sinus pressure  She never really got better from her last visit  Last ov reviewed.     HPI HYPERTENSION   Blood pressure range-not checking  Chest pain- no      Dyspnea- no Lightheadedness- no   Edema- no change  Other side effects - no   Medication compliance: good Low salt diet- yes     DIABETES    Blood Sugar ranges-good per pt   Polyuria- no New Visual problems- no  Hypoglycemic symptoms- no  Other side effects-no Medication compliance - good  Last eye exam- 01/2018 Foot exam- per podiatry and ortho   HYPERLIPIDEMIA  Medication compliance- good  RUQ pain- no  Muscle aches- no Other side effects-no   Review of Systems  Constitutional: Negative for chills and fever.  HENT: Positive for congestion, postnasal drip, rhinorrhea, sinus pressure, sinus pain and sneezing.   Respiratory: Negative for cough, chest tightness, shortness of breath and wheezing.   Cardiovascular: Negative for chest pain, palpitations and leg swelling.  Allergic/Immunologic: Negative for environmental allergies.    History Past Medical History:  Diagnosis Date  . Acute MI [facility]) 2007   presented to ED & had cardiac cath- but found to have normal coronaries. Since that point in time her PCP cares f or cardiac needs. Dr. [name] - [facility]  . Anemia   . Anginal pain ([place])   . Anxiety   . Asthma   . Bulging lumbar disc   . Cataract   . 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 Cirrhosis of liver without mention of alcohol   . Constipation   . Dehiscence of closure of skin    left partial calcaneal excision  . Depression   . Diabetes mellitus    insulin dependent, adult onset  . Episode of visual loss of left eye   . Exertional shortness of breath   . Fatty liver   . Fibromyalgia   . GERD (gastroesophageal reflux disease)   . Hepatic steatosis   . High cholesterol   . Hypertension   . MRSA (methicillin resistant Staphylococcus aureus)   . Neuropathy    lower legs  . Osteoarthritis    hands, hips  . Proximal humerus fracture 10/15/12   Left  . PTSD (post-traumatic stress disorder)   . 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   Her family history includes Cancer in her father; Colitis in her father; Crohn's disease in her father; Diabetes in her father and mother; Diabetes Mellitus I in her brother; Diabetes Mellitus II in her brother, brother, brother, and brother; Heart attack in her brother; Heart disease in her brother, brother, and father; Hypertension in her mother; Irritable  bowel syndrome in her daughter; Kidney disease in her brother, brother, brother, brother, and brother; Leukemia in her father; Liver disease in her brother and brother; Mental illness in her mother.She reports that she has never smoked. She has never used smokeless tobacco. She reports that she does not drink alcohol or use drugs.  Current Outpatient Medications on File Prior to Visit  Medication Sig Dispense Refill  . atorvastatin (LIPITOR) 10 MG tablet TAKE 1 TABLET BY MOUTH ONCE DAILY 60 tablet 2  . Calcium Carbonate-Vitamin D (CALTRATE  600+D) 600-400 MG-UNIT per tablet Take 1 tablet by mouth daily.     . Cholecalciferol (VITAMIN D3) 5000 UNITS CAPS Take 1 capsule by mouth 2 (two) times daily.    [name] Kitchen CINNAMON PO Take 1,000 mg by mouth daily.    . collagenase (SANTYL) ointment Apply topically daily. 15 g 0  . cyclobenzaprine (FLEXERIL) 10 MG tablet TAKE 1 TABLET BY MOUTH THREE TIMES DAILY AS NEEDED FOR MUSCLE SPASM 60 tablet 1  . DULoxetine (CYMBALTA) 60 MG capsule Take 1 capsule (60 mg total) by mouth daily. 30 capsule 5  . ENSURE PLUS (ENSURE PLUS) LIQD Take 237 mLs by mouth daily.     . fenofibrate 160 MG tablet Take 1 tablet (160 mg total) by mouth daily. 30 tablet 5  . ferrous sulfate (SLOW FE) 160 (50 FE) MG TBCR SR tablet Take 160 mg by mouth daily.    . Flaxseed, Linseed, (FLAX SEED OIL) 1000 MG CAPS Take 1 capsule by mouth at bedtime.    . fluticasone (FLONASE) 50 MCG/ACT nasal spray Place 2 sprays into both nostrils daily. 16 g 6  . hydrochlorothiazide (HYDRODIURIL) 25 MG tablet Take 1 tablet (25 mg total) by mouth daily. 90 tablet 1  . HYDROcodone-acetaminophen (NORCO/VICODIN) 5-325 MG tablet Take 1 tablet by mouth every 6 (six) hours as needed for moderate pain. 90 tablet 0  . HYDROcodone-acetaminophen (NORCO/VICODIN) 5-325 MG tablet Take 1 tablet by mouth every 6 (six) hours as needed for moderate pain. 90 tablet 0  . HYDROcodone-acetaminophen (NORCO/VICODIN) 5-325 MG tablet Take 1 to 2 tablets by mouth every 4 hours as needed 90 tablet 0  . Insulin Glargine (LANTUS SOLOSTAR) 100 UNIT/ML Solostar Pen INJECT 18 UNITS SUBCUTANEOUSLY IN THE MORNING AND 40 UNITS AT BEDTIME 45 mL 0  . insulin lispro (HUMALOG KWIKPEN) 100 UNIT/ML KiwkPen Inject 0.13 mLs (13 Units total) into the skin 3 (three) times daily. 15 mL 11  . levocetirizine (XYZAL) 5 MG tablet Take 1 tablet (5 mg total) by mouth every evening. 30 tablet 5  . levofloxacin (LEVAQUIN) 750 MG tablet Take 1 tablet (750 mg total) by mouth every other day. 30 tablet 0  .  LYRICA 150 MG capsule TAKE 1 CAPSULE BY MOUTH THREE TIMES DAILY 90 capsule 0  . montelukast (SINGULAIR) 10 MG tablet Take 1 tablet (10 mg total) by mouth at bedtime. 30 tablet 3  . Multiple Vitamin (MULTIVITAMIN) tablet Take 1 tablet by mouth daily.     . naproxen (NAPROSYN) 500 MG tablet Take 1 tablet (500 mg total) by mouth 2 (two) times daily as needed. 60 tablet 1  . ondansetron (ZOFRAN ODT) 8 MG disintegrating tablet Take 1 tablet (8 mg total) by mouth every 8 (eight) hours as needed for nausea or vomiting. 20 tablet 0  . polyethylene glycol (MIRALAX / GLYCOLAX) packet Take 17 g by mouth daily.      . pregabalin (LYRICA) 150 MG capsule TAKE 1 CAPSULE BY MOUTH THREE  TIMES DAILY AS NEEDED 90 capsule 0  . pregabalin (LYRICA) 150 MG capsule TAKE 1 CAPSULE BY MOUTH THREE TIMES DAILY AS NEEDED 90 capsule 0  . silver sulfADIAZINE (SILVADENE) 1 % cream Apply 1 application topically daily. 400 g 2  . silver sulfADIAZINE (SILVADENE) 1 % cream Apply 1 application topically daily. Apply to affected area daily plus dry dressing 400 g 3  . vitamin C (ASCORBIC ACID) 500 MG tablet Take 500 mg by mouth daily.    . [DISCONTINUED] metFORMIN (GLUCOPHAGE) 1000 MG tablet Take 1,000 mg by mouth 2 (two) times daily with a meal.      . [DISCONTINUED] omeprazole (PRILOSEC) 20 MG capsule Take 20 mg by mouth daily.       No current facility-administered medications on file prior to visit.      Objective:  Objective  Physical Exam  Constitutional: She is oriented to person, place, and time. She appears well-developed and well-nourished.  HENT:  Right Ear: External ear normal.  Left Ear: External ear normal.  Nose: Right sinus exhibits maxillary sinus tenderness and frontal sinus tenderness. Left sinus exhibits maxillary sinus tenderness and frontal sinus tenderness.  + PND + errythema  Eyes: Conjunctivae are normal. Right eye exhibits no discharge. Left eye exhibits no discharge.  Cardiovascular: Normal rate,  regular rhythm and normal heart sounds.  No murmur heard. Pulmonary/Chest: Effort normal and breath sounds normal. No respiratory distress. She has no wheezes. She has no rales. She exhibits no tenderness.  Musculoskeletal: She exhibits no edema.  Lymphadenopathy:    She has cervical adenopathy.  Neurological: She is alert and oriented to person, place, and time.  Nursing note and vitals reviewed.  BP (!) 110/58 (BP Location: Right Arm, Cuff Size: Normal)   Pulse 81   Temp 99.1 F (37.3 C) (Oral)   Resp 16   Ht 5' 1"  (1.549 m)   Wt 192 lb 3.2 oz (87.2 kg)   SpO2 97%   BMI 36.32 kg/m  Wt Readings from Last 3 Encounters:  03/07/18 192 lb 3.2 oz (87.2 kg)  02/04/18 191 lb (86.6 kg)  01/08/18 191 lb 9.6 oz (86.9 kg)     Lab Results  Component Value Date   WBC 7.0 11/01/2017   HGB 9.5 (L) 11/01/2017   HCT 30.7 (L) 11/01/2017   PLT 176 11/01/2017   GLUCOSE 164 (H) 11/15/2017   CHOL 106 11/15/2017   TRIG 106.0 11/15/2017   HDL 33.00 (L) 11/15/2017   LDLDIRECT 115.8 01/23/2012   LDLCALC 52 11/15/2017   ALT 17 11/15/2017   AST 24 11/15/2017   NA 139 11/15/2017   K 4.9 11/15/2017   CL 106 11/15/2017   CREATININE 1.52 (H) 11/15/2017   BUN 28 (H) 11/15/2017   CO2 27 11/15/2017   TSH 1.65 01/03/2016   INR 1.16 04/09/2017   HGBA1C 8.6 (H) 11/15/2017   MICROALBUR 8.9 (H) 11/15/2017    Ct Angio Chest Pe W And/or Wo Contrast  Result Date: 11/01/2017 CLINICAL DATA:  Burning sensation in the chest. High pretest probability for pulmonary embolism. EXAM: CT ANGIOGRAPHY CHEST WITH CONTRAST TECHNIQUE: Multidetector CT imaging of the chest was performed using the standard protocol during bolus administration of intravenous contrast. Multiplanar CT image reconstructions and MIPs were obtained to evaluate the vascular anatomy. CONTRAST:  153m ISOVUE-370 IOPAMIDOL (ISOVUE-370) INJECTION 76% COMPARISON:  None. FINDINGS: Cardiovascular: Satisfactory opacification of the pulmonary arteries to  the segmental level. No evidence of pulmonary embolism. Borderline heart size. No pericardial effusion.  Mediastinum/Nodes: Negative for adenopathy. Nodule projecting posteriorly from the left thyroid, margins not discretely visualized. No convincing size greater than 9 mm, below size threshold for strict imaging follow-up. Patient has history of multinodular goiter based on 2014 thyroid ultrasound. Lungs/Pleura: There is no edema, consolidation, effusion, or pneumothorax. Upper Abdomen: Known cirrhosis. Status post cholecystectomy. Granulomatous type calcifications seen in the liver and spleen. Musculoskeletal: No acute or aggressive finding. Advanced glenohumeral osteoarthritis on the left. Remote left mid clavicle fracture. Review of the MIP images confirms the above findings. IMPRESSION: 1. Negative for pulmonary embolism or other acute finding. 2. Aortic Atherosclerosis (ICD10-I70.0).  Coronary atherosclerosis. 3. Cirrhosis. Electronically Signed   By: Monte Fantasia M.D.   On: 11/01/2017 22:47   US Venous Img Lower Unilateral Left  Result Date: 11/01/2017 CLINICAL DATA:  Left thigh pain with redness and swelling for 3 days. EXAM: Left LOWER EXTREMITY VENOUS DOPPLER ULTRASOUND TECHNIQUE: Gray-scale sonography with graded compression, as well as color Doppler and duplex ultrasound were performed to evaluate the lower extremity deep venous systems from the level of the common femoral vein and including the common femoral, femoral, profunda femoral, popliteal and calf veins including the posterior tibial, peroneal and gastrocnemius veins when visible. The superficial great saphenous vein was also interrogated. Spectral Doppler was utilized to evaluate flow at rest and with distal augmentation maneuvers in the common femoral, femoral and popliteal veins. COMPARISON:  None. FINDINGS: Contralateral Common Femoral Vein: Respiratory phasicity is normal and symmetric with the symptomatic side. No evidence of  thrombus. Normal compressibility. Common Femoral Vein: No evidence of thrombus. Normal compressibility, respiratory phasicity and response to augmentation. Saphenofemoral Junction: No evidence of thrombus. Normal compressibility and flow on color Doppler imaging. Profunda Femoral Vein: No evidence of thrombus. Normal compressibility and flow on color Doppler imaging. Femoral Vein: No evidence of thrombus. Normal compressibility, respiratory phasicity and response to augmentation. Popliteal Vein: No evidence of thrombus. Normal compressibility, respiratory phasicity and response to augmentation. Calf Veins: No evidence of thrombus. Normal compressibility and flow on color Doppler imaging. Superficial Great Saphenous Vein: No evidence of thrombus. Normal compressibility. Venous Reflux:  None. Other Findings: Enlarged lymph nodes are demonstrated in the left upper thigh region. Lymph nodes demonstrate fatty hilar architecture suggesting benign reactive nodes. IMPRESSION: No evidence of deep venous thrombosis in the left lower extremity. Left upper thigh lymphadenopathy, likely reactive. Electronically Signed   By: Lucienne Capers M.D.   On: 11/01/2017 22:36     Assessment & Plan:  Plan  I have discontinued [name] [name]'s doxycycline. I am also having her start on clarithromycin. Additionally, I am having her maintain her Calcium Carbonate-Vitamin D, multivitamin, polyethylene glycol, CINNAMON PO, Vitamin D3, Flax Seed Oil, ENSURE PLUS, vitamin C, ferrous sulfate, fenofibrate, ondansetron, collagenase, levofloxacin, naproxen, insulin lispro, cyclobenzaprine, silver sulfADIAZINE, pregabalin, pregabalin, DULoxetine, levocetirizine, fluticasone, montelukast, atorvastatin, silver sulfADIAZINE, hydrochlorothiazide, HYDROcodone-acetaminophen, HYDROcodone-acetaminophen, HYDROcodone-acetaminophen, LYRICA, and Insulin Glargine.  Meds ordered this encounter  Medications  . clarithromycin (BIAXIN XL) 500 MG 24 hr  tablet    Sig: Take 2 tablets (1,000 mg total) by mouth daily for 10 days.    Dispense:  28 tablet    Refill:  0    Problem List Items Addressed This Visit      Unprioritized   Essential hypertension - Primary    Well controlled, no changes to meds. Encouraged heart healthy diet such as the DASH diet and exercise as tolerated.       Relevant Orders   Comprehensive metabolic  panel   Hemoglobin A1c   Lipid panel   Sinusitis   Relevant Medications   clarithromycin (BIAXIN XL) 500 MG 24 hr tablet   Sinusitis, acute maxillary    biaxin Pt told to hold statin while on biaxin       Relevant Medications   clarithromycin (BIAXIN XL) 500 MG 24 hr tablet   Type 2 diabetes mellitus with hyperglycemia, with long-term current use of insulin (HCC)    hgba1c to be checked, minimize simple carbs. Increase exercise as tolerated. Continue current meds Per endo          Follow-up: Return in about 6 months (around 09/07/2018) for annual exam, fasting.  Ann Held, DO

## 2018-03-07 NOTE — Patient Instructions (Signed)
Sinusitis, Adult Sinusitis is soreness and inflammation of your sinuses. Sinuses are hollow spaces in the bones around your face. Your sinuses are located:  Around your eyes.  In the middle of your forehead.  Behind your nose.  In your cheekbones.  Your sinuses and nasal passages are lined with a stringy fluid (mucus). Mucus normally drains out of your sinuses. When your nasal tissues become inflamed or swollen, the mucus can become trapped or blocked so air cannot flow through your sinuses. This allows bacteria, viruses, and funguses to grow, which leads to infection. Sinusitis can develop quickly and last for 7?10 days (acute) or for more than 12 weeks (chronic). Sinusitis often develops after a cold. What are the causes? This condition is caused by anything that creates swelling in the sinuses or stops mucus from draining, including:  Allergies.  Asthma.  Bacterial or viral infection.  Abnormally shaped bones between the nasal passages.  Nasal growths that contain mucus (nasal polyps).  Narrow sinus openings.  Pollutants, such as chemicals or irritants in the air.  A foreign object stuck in the nose.  A fungal infection. This is rare.  What increases the risk? The following factors may make you more likely to develop this condition:  Having allergies or asthma.  Having had a recent cold or respiratory tract infection.  Having structural deformities or blockages in your nose or sinuses.  Having a weak immune system.  Doing a lot of swimming or diving.  Overusing nasal sprays.  Smoking.  What are the signs or symptoms? The main symptoms of this condition are pain and a feeling of pressure around the affected sinuses. Other symptoms include:  Upper toothache.  Earache.  Headache.  Bad breath.  Decreased sense of smell and taste.  A cough that may get worse at night.  Fatigue.  Fever.  Thick drainage from your nose. The drainage is often green and  it may contain pus (purulent).  Stuffy nose or congestion.  Postnasal drip. This is when extra mucus collects in the throat or back of the nose.  Swelling and warmth over the affected sinuses.  Sore throat.  Sensitivity to light.  How is this diagnosed? This condition is diagnosed based on symptoms, a medical history, and a physical exam. To find out if your condition is acute or chronic, your health care provider may:  Look in your nose for signs of nasal polyps.  Tap over the affected sinus to check for signs of infection.  View the inside of your sinuses using an imaging device that has a light attached (endoscope).  If your health care provider suspects that you have chronic sinusitis, you may also:  Be tested for allergies.  Have a sample of mucus taken from your nose (nasal culture) and checked for bacteria.  Have a mucus sample examined to see if your sinusitis is related to an allergy.  If your sinusitis does not respond to treatment and it lasts longer than 8 weeks, you may have an MRI or CT scan to check your sinuses. These scans also help to determine how severe your infection is. In rare cases, a bone biopsy may be done to rule out more serious types of fungal sinus disease. How is this treated? Treatment for sinusitis depends on the cause and whether your condition is chronic or acute. If a virus is causing your sinusitis, your symptoms will go away on their own within 10 days. You may be given medicines to relieve your symptoms,   including:  Topical nasal decongestants. They shrink swollen nasal passages and let mucus drain from your sinuses.  Antihistamines. These drugs block inflammation that is triggered by allergies. This can help to ease swelling in your nose and sinuses.  Topical nasal corticosteroids. These are nasal sprays that ease inflammation and swelling in your nose and sinuses.  Nasal saline washes. These rinses can help to get rid of thick mucus in  your nose.  If your condition is caused by bacteria, you will be given an antibiotic medicine. If your condition is caused by a fungus, you will be given an antifungal medicine. Surgery may be needed to correct underlying conditions, such as narrow nasal passages. Surgery may also be needed to remove polyps. Follow these instructions at home: Medicines  Take, use, or apply over-the-counter and prescription medicines only as told by your health care provider. These may include nasal sprays.  If you were prescribed an antibiotic medicine, take it as told by your health care provider. Do not stop taking the antibiotic even if you start to feel better. Hydrate and Humidify  Drink enough water to keep your urine clear or pale yellow. Staying hydrated will help to thin your mucus.  Use a cool mist humidifier to keep the humidity level in your home above 50%.  Inhale steam for 10-15 minutes, 3-4 times a day or as told by your health care provider. You can do this in the bathroom while a hot shower is running.  Limit your exposure to cool or dry air. Rest  Rest as much as possible.  Sleep with your head raised (elevated).  Make sure to get enough sleep each night. General instructions  Apply a warm, moist washcloth to your face 3-4 times a day or as told by your health care provider. This will help with discomfort.  Wash your hands often with soap and water to reduce your exposure to viruses and other germs. If soap and water are not available, use hand sanitizer.  Do not smoke. Avoid being around people who are smoking (secondhand smoke).  Keep all follow-up visits as told by your health care provider. This is important. Contact a health care provider if:  You have a fever.  Your symptoms get worse.  Your symptoms do not improve within 10 days. Get help right away if:  You have a severe headache.  You have persistent vomiting.  You have pain or swelling around your face or  eyes.  You have vision problems.  You develop confusion.  Your neck is stiff.  You have trouble breathing. This information is not intended to replace advice given to you by your health care provider. Make sure you discuss any questions you have with your health care provider. Document Released: 10/16/2005 Document Revised: 06/11/2016 Document Reviewed: 08/11/2015 Elsevier Interactive Patient Education  2018 [employer]  

## 2018-03-08 ENCOUNTER — Telehealth: Payer: Self-pay | Admitting: *Deleted

## 2018-03-08 LAB — COMPREHENSIVE METABOLIC PANEL
ALK PHOS: 89 U/L (ref 39–117)
ALT: 15 U/L (ref 0–35)
AST: 18 U/L (ref 0–37)
Albumin: 3.7 g/dL (ref 3.5–5.2)
BILIRUBIN TOTAL: 0.5 mg/dL (ref 0.2–1.2)
BUN: 29 mg/dL — ABNORMAL HIGH (ref 6–23)
CO2: 25 meq/L (ref 19–32)
Calcium: 9.4 mg/dL (ref 8.4–10.5)
Chloride: 104 mEq/L (ref 96–112)
Creatinine, Ser: 1.64 mg/dL — ABNORMAL HIGH (ref 0.40–1.20)
GFR: 33.72 mL/min — AB (ref >60.00)
GLUCOSE: 161 mg/dL — AB (ref 70–99)
Potassium: 4.5 mEq/L (ref 3.5–5.1)
Sodium: 138 mEq/L (ref 135–145)
TOTAL PROTEIN: 7.3 g/dL (ref 6.0–8.3)

## 2018-03-08 LAB — LIPID PANEL
CHOL/HDL RATIO: 3
Cholesterol: 97 mg/dL (ref 0–200)
HDL: 32.9 mg/dL — ABNORMAL LOW (ref >39.00)
LDL CALC: 45 mg/dL (ref 0–99)
NONHDL: 64.39
TRIGLYCERIDES: 97 mg/dL (ref 0.0–149.0)
VLDL: 19.4 mg/dL (ref 0.0–40.0)

## 2018-03-08 LAB — HEMOGLOBIN A1C: Hgb A1c MFr Bld: 9.9 % — ABNORMAL HIGH (ref 4.6–6.5)

## 2018-03-08 NOTE — Telephone Encounter (Signed)
Received fax from pharmacy stating that patient must be advised of drug-drug interaction between Atorvastatin and Clarithromycin. Per provider, pt was informed to hold the statin while taking the ABX. Called patient for f/u reminder and she stated that the pharmacy informed her that "there was a problem between the pharmacy and her doctor; therefore, they would not be able to fill her prescription". I informed the patient that I would get in contact with the pharmacy now and that she could pick-up her ABX today. [place] and informed that patient was made aware of the Interaction risk and to hold her statin as long as she was taking the clarithromycin abx; patient understood & agreed. Gave my name and pharmacy agreed to fill Rx. Pharmacy stated that they require documentation with and/or on the prescription that patient has been made aware and instructions before they dispense the two medications simultaneously, informed provider/SLS 05/10

## 2018-03-11 ENCOUNTER — Encounter: Admit: 2018-03-11 | Discharge: 2018-03-11 | Payer: MEDICARE

## 2018-03-11 DIAGNOSIS — D649 Anemia, unspecified: Principal | ICD-10-CM

## 2018-03-13 ENCOUNTER — Encounter: Admit: 2018-03-13 | Discharge: 2018-03-13 | Payer: MEDICARE

## 2018-03-13 DIAGNOSIS — K219 Gastro-esophageal reflux disease without esophagitis: Principal | ICD-10-CM

## 2018-03-13 MED ORDER — BETAMETHASONE VALERATE 0.1 % TP CREA
Freq: Every day | TOPICAL | 1 refills | 30.00000 days | Status: AC
Start: 2018-03-13 — End: 2018-08-16

## 2018-03-13 MED ORDER — PANTOPRAZOLE 40 MG PO TBEC
ORAL_TABLET | Freq: Every day | ORAL | 1 refills | 90.00000 days | Status: AC
Start: 2018-03-13 — End: 2018-08-28

## 2018-03-13 MED ORDER — METFORMIN 1,000 MG PO TAB
ORAL_TABLET | Freq: Two times a day (BID) | 1 refills | Status: AC
Start: 2018-03-13 — End: 2018-08-28

## 2018-03-18 ENCOUNTER — Encounter (INDEPENDENT_AMBULATORY_CARE_PROVIDER_SITE_OTHER): Payer: Self-pay | Admitting: Orthopedic Surgery

## 2018-03-18 ENCOUNTER — Ambulatory Visit (INDEPENDENT_AMBULATORY_CARE_PROVIDER_SITE_OTHER): Payer: PRIVATE HEALTH INSURANCE | Admitting: Orthopedic Surgery

## 2018-03-18 VITALS — Ht 61.0 in | Wt 192.0 lb

## 2018-03-18 DIAGNOSIS — L97421 Non-pressure chronic ulcer of left heel and midfoot limited to breakdown of skin: Secondary | ICD-10-CM

## 2018-03-18 DIAGNOSIS — L97521 Non-pressure chronic ulcer of other part of left foot limited to breakdown of skin: Secondary | ICD-10-CM | POA: Diagnosis not present

## 2018-03-18 DIAGNOSIS — E1142 Type 2 diabetes mellitus with diabetic polyneuropathy: Secondary | ICD-10-CM | POA: Diagnosis not present
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
## 2018-03-19 ENCOUNTER — Other Ambulatory Visit: Payer: Self-pay | Admitting: Emergency Medicine

## 2018-03-19 ENCOUNTER — Telehealth: Payer: Self-pay | Admitting: Family Medicine

## 2018-03-19 DIAGNOSIS — E1165 Type 2 diabetes mellitus with hyperglycemia: Secondary | ICD-10-CM

## 2018-03-19 DIAGNOSIS — Z794 Long term (current) use of insulin: Secondary | ICD-10-CM

## 2018-03-19 DIAGNOSIS — N289 Disorder of kidney and ureter, unspecified: Secondary | ICD-10-CM

## 2018-03-19 DIAGNOSIS — I10 Essential (primary) hypertension: Secondary | ICD-10-CM

## 2018-03-19 NOTE — Telephone Encounter (Signed)
Patient called returning VM from office  No result note in [facility]  QUE  CRM  [id number] Viewed   Result  Notes entered by Dr Carollee Herter  On 03/16/2018 in lab results given to patient   Patient agrees with Plan of care    Labs  Scheduled

## 2018-03-20 ENCOUNTER — Other Ambulatory Visit: Payer: Self-pay | Admitting: Family Medicine

## 2018-03-20 DIAGNOSIS — Z794 Long term (current) use of insulin: Principal | ICD-10-CM

## 2018-03-20 DIAGNOSIS — E11621 Type 2 diabetes mellitus with foot ulcer: Secondary | ICD-10-CM

## 2018-03-20 DIAGNOSIS — E1165 Type 2 diabetes mellitus with hyperglycemia: Principal | ICD-10-CM

## 2018-03-20 DIAGNOSIS — L97509 Non-pressure chronic ulcer of other part of unspecified foot with unspecified severity: Principal | ICD-10-CM

## 2018-03-20 DIAGNOSIS — IMO0002 Reserved for concepts with insufficient information to code with codable children: Secondary | ICD-10-CM

## 2018-04-02 ENCOUNTER — Other Ambulatory Visit: Payer: Self-pay | Admitting: Family Medicine

## 2018-04-02 ENCOUNTER — Telehealth: Payer: Self-pay | Admitting: Family Medicine

## 2018-04-02 ENCOUNTER — Encounter: Admit: 2018-04-02 | Discharge: 2018-04-02 | Payer: MEDICARE

## 2018-04-02 DIAGNOSIS — E11621 Type 2 diabetes mellitus with foot ulcer: Secondary | ICD-10-CM

## 2018-04-02 DIAGNOSIS — L97509 Non-pressure chronic ulcer of other part of unspecified foot with unspecified severity: Principal | ICD-10-CM

## 2018-04-02 DIAGNOSIS — IMO0002 Reserved for concepts with insufficient information to code with codable children: Secondary | ICD-10-CM

## 2018-04-02 DIAGNOSIS — Z794 Long term (current) use of insulin: Principal | ICD-10-CM

## 2018-04-02 DIAGNOSIS — E1165 Type 2 diabetes mellitus with hyperglycemia: Principal | ICD-10-CM

## 2018-04-02 MED ORDER — FLUCONAZOLE 150 MG PO TAB
150 mg | ORAL_TABLET | Freq: Every day | ORAL | 0 refills | 3.00000 days | Status: AC
Start: 2018-04-02 — End: 2019-02-20

## 2018-04-02 MED ORDER — INSULIN GLARGINE 100 UNIT/ML SOLOSTAR PEN: PEN_INJECTOR | SUBCUTANEOUS | 0 refills | Status: DC

## 2018-04-02 NOTE — Telephone Encounter (Signed)
Requesting: LYRICA 150 MG Contract: 08/16/17 UDS: 08/16/17 Low risk Last OV: 03/07/18 Next OV:-- Last Refill: 02/11/18   Please advise

## 2018-04-02 NOTE — Telephone Encounter (Signed)
Copied from [place] [id number]. Topic: Quick Communication - Rx Refill/Question >> Apr 02, 2018  2:14 PM Mcneil, Ja-Kwan wrote: Medication: LYRICA 150 MG capsule  Preferred Pharmacy (with phone number or street name): [place], [place] [phone] (Phone) [phone] (Fax)   Agent: Please be advised that RX refills may take up to 3 business days. We ask that you follow-up with your pharmacy.

## 2018-04-03 NOTE — Telephone Encounter (Signed)
Request already completed:  Disp Refills Start End   LYRICA 150 MG capsule 90 capsule 0 04/03/2018    Sig: TAKE 1 CAPSULE BY MOUTH THREE TIMES DAILY   Sent to pharmacy as: LYRICA 150 MG capsule   E-Prescribing Status: Receipt confirmed by pharmacy (04/03/2018 9:23 AM EDT)

## 2018-04-18 ENCOUNTER — Ambulatory Visit (INDEPENDENT_AMBULATORY_CARE_PROVIDER_SITE_OTHER): Payer: PRIVATE HEALTH INSURANCE | Admitting: Orthopedic Surgery

## 2018-04-18 ENCOUNTER — Ambulatory Visit: Payer: PRIVATE HEALTH INSURANCE | Admitting: Internal Medicine

## 2018-05-01 ENCOUNTER — Ambulatory Visit (INDEPENDENT_AMBULATORY_CARE_PROVIDER_SITE_OTHER): Payer: PRIVATE HEALTH INSURANCE | Admitting: Orthopedic Surgery

## 2018-05-06 ENCOUNTER — Encounter: Payer: Self-pay | Admitting: Family Medicine

## 2018-05-06 ENCOUNTER — Ambulatory Visit: Payer: PRIVATE HEALTH INSURANCE | Admitting: Medical

## 2018-05-06 ENCOUNTER — Ambulatory Visit (INDEPENDENT_AMBULATORY_CARE_PROVIDER_SITE_OTHER): Payer: PRIVATE HEALTH INSURANCE | Admitting: Family Medicine

## 2018-05-06 VITALS — BP 100/56 | HR 82 | Temp 98.4°F | Resp 16 | Ht 61.0 in | Wt 193.4 lb

## 2018-05-06 DIAGNOSIS — M797 Fibromyalgia: Secondary | ICD-10-CM

## 2018-05-06 DIAGNOSIS — M79672 Pain in left foot: Secondary | ICD-10-CM | POA: Diagnosis not present

## 2018-05-06 DIAGNOSIS — Z79899 Other long term (current) drug therapy: Secondary | ICD-10-CM

## 2018-05-06 MED ORDER — HYDROCODONE-ACETAMINOPHEN 5-325 MG PO TABS: 1.0000 | ORAL_TABLET | Freq: Four times a day (QID) | ORAL | 0 refills | Status: DC | PRN

## 2018-05-06 MED ORDER — HYDROCODONE-ACETAMINOPHEN 5-325 MG PO TABS: ORAL_TABLET | ORAL | 0 refills | Status: DC

## 2018-05-06 NOTE — Patient Instructions (Signed)
Earache, Adult An earache, or ear pain, can be caused by many things, including:  An infection.  Ear wax buildup.  Ear pressure.  Something in the ear that should not be there (foreign body).  A sore throat.  Tooth problems.  Jaw problems.  Treatment of the earache will depend on the cause. If the cause is not clear or cannot be determined, you may need to watch your symptoms until your earache goes away or until a cause is found. Follow these instructions at home: Pay attention to any changes in your symptoms. Take these actions to help with your pain:  Take or apply over-the-counter and prescription medicines only as told by your health care provider.  If you were prescribed an antibiotic medicine, use it as told by your health care provider. Do not stop using the antibiotic even if you start to feel better.  Do not put anything in your ear other than medicine that is prescribed by your health care provider.  If directed, apply heat to the affected area as often as told by your health care provider. Use the heat source that your health care provider recommends, such as a moist heat pack or a heating pad. ? Place a towel between your skin and the heat source. ? Leave the heat on for 20-30 minutes. ? Remove the heat if your skin turns bright red. This is especially important if you are unable to feel pain, heat, or cold. You may have a greater risk of getting burned.  If directed, put ice on the ear: ? Put ice in a plastic bag. ? Place a towel between your skin and the bag. ? Leave the ice on for 20 minutes, 2-3 times a day.  Try resting in an upright position instead of lying down. This may help to reduce pressure in your ear and relieve pain.  Chew gum if it helps to relieve your ear pain.  Treat any allergies as told by your health care provider.  Keep all follow-up visits as told by your health care provider. This is important.  Contact a health care provider  if:  Your pain does not improve within 2 days.  Your earache gets worse.  You have new symptoms.  You have a fever. Get help right away if:  You have a severe headache.  You have a stiff neck.  You have trouble swallowing.  You have redness or swelling behind your ear.  You have fluid or blood coming from your ear.  You have hearing loss.  You feel dizzy. This information is not intended to replace advice given to you by your health care provider. Make sure you discuss any questions you have with your health care provider. Document Released: 06/02/2004 Document Revised: 06/13/2016 Document Reviewed: 04/10/2016 Elsevier Interactive Patient Education  2018 [employer]  

## 2018-05-06 NOTE — Progress Notes (Signed)
Patient ID: [name], female    DOB: 10-28-1956  Age: 62 y.o. MRN: [id number]    Subjective:  Subjective  HPI [name] [name] presents for f/u pain meds and uds.  No other complaints.   Review of Systems  Constitutional: Positive for fatigue. Negative for activity change, appetite change and unexpected weight change.  Respiratory: Negative for cough and shortness of breath.   Cardiovascular: Negative for chest pain and palpitations.  Psychiatric/Behavioral: Negative for behavioral problems and dysphoric mood. The patient is not nervous/anxious.     History Past Medical History:  Diagnosis Date  . Acute MI [facility]) 2007   presented to ED & had cardiac cath- but found to have normal coronaries. Since that point in time her PCP cares f or cardiac needs. Dr. [name] - [facility]  . Anemia   . Anginal pain ([place])   . Anxiety   . Asthma   . Bulging lumbar disc   . Cataract   . 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 Cirrhosis of liver without mention of alcohol   . Constipation   . Dehiscence of closure of skin    left partial calcaneal excision  . Depression   . Diabetes mellitus    insulin dependent, adult onset  . Episode of visual loss of left eye   . Exertional shortness of breath   . Fatty liver   . Fibromyalgia   . GERD (gastroesophageal reflux disease)   . Hepatic steatosis   . High cholesterol   . Hypertension   . MRSA (methicillin resistant Staphylococcus aureus)   . Neuropathy    lower legs  . Osteoarthritis    hands, hips  . Proximal humerus fracture 10/15/12   Left  . PTSD (post-traumatic stress disorder)   . 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); Cardiac  catheterization (2007); Skin graft split thickness leg / foot (Right, 03/19/2013); Cholecystectomy (1995); Abdominal hysterectomy (1979); Dilation and curettage of uterus (1977); I&D extremity (Right, 03/19/2013); I&D extremity (Left, 09/08/2016); and I&D extremity (Left, 09/29/2016).   Her family history includes Cancer in her father; Colitis in her father; Crohn's disease in her father; Diabetes in her father and mother; Diabetes Mellitus I in her brother; Diabetes Mellitus II in her brother, brother, brother, and brother; Heart attack in her brother; Heart disease in her brother, brother, and father; Hypertension in her mother; Irritable bowel syndrome in her daughter; Kidney disease in her brother, brother, brother, brother, and brother; Leukemia in her father; Liver disease in her brother and brother; Mental illness in her mother.She reports that she has never smoked. She has never used smokeless tobacco. She reports that she does not drink alcohol or use drugs.  Current Outpatient Medications on File Prior to Visit  Medication Sig Dispense Refill  . atorvastatin (LIPITOR) 10 MG tablet TAKE 1 TABLET BY MOUTH ONCE DAILY 60 tablet 2  . Calcium Carbonate-Vitamin D (CALTRATE 600+D) 600-400 MG-UNIT per tablet Take 1 tablet by mouth daily.     . Cholecalciferol (VITAMIN D3) 5000 UNITS CAPS Take 1 capsule by mouth 2 (two) times daily.    [name] Kitchen CINNAMON PO Take 1,000 mg by mouth daily.    . collagenase (SANTYL) ointment Apply topically daily. 15 g 0  .  cyclobenzaprine (FLEXERIL) 10 MG tablet TAKE 1 TABLET BY MOUTH THREE TIMES DAILY AS NEEDED FOR MUSCLE SPASM 60 tablet 1  . DULoxetine (CYMBALTA) 60 MG capsule Take 1 capsule (60 mg total) by mouth daily. 30 capsule 5  . ENSURE PLUS (ENSURE PLUS) LIQD Take 237 mLs by mouth daily.     . fenofibrate 160 MG tablet Take 1 tablet (160 mg total) by mouth daily. 30 tablet 5  . ferrous sulfate (SLOW FE) 160 (50 FE) MG TBCR SR tablet Take 160 mg by mouth daily.    .  Flaxseed, Linseed, (FLAX SEED OIL) 1000 MG CAPS Take 1 capsule by mouth at bedtime.    . fluticasone (FLONASE) 50 MCG/ACT nasal spray Place 2 sprays into both nostrils daily. 16 g 6  . hydrochlorothiazide (HYDRODIURIL) 25 MG tablet Take 1 tablet (25 mg total) by mouth daily. 90 tablet 1  . Insulin Glargine (LANTUS SOLOSTAR) 100 UNIT/ML Solostar Pen INJECT 20 UNITS SUBCUTANEOUSLY IN THE MORNING AND 40 UNITS AT BEDTIME 45 mL 0  . insulin lispro (HUMALOG KWIKPEN) 100 UNIT/ML KiwkPen Inject 0.13 mLs (13 Units total) into the skin 3 (three) times daily. 15 mL 11  . levocetirizine (XYZAL) 5 MG tablet Take 1 tablet (5 mg total) by mouth every evening. 30 tablet 5  . levofloxacin (LEVAQUIN) 750 MG tablet Take 1 tablet (750 mg total) by mouth every other day. 30 tablet 0  . LYRICA 150 MG capsule TAKE 1 CAPSULE BY MOUTH THREE TIMES DAILY 90 capsule 0  . montelukast (SINGULAIR) 10 MG tablet Take 1 tablet (10 mg total) by mouth at bedtime. 30 tablet 3  . Multiple Vitamin (MULTIVITAMIN) tablet Take 1 tablet by mouth daily.     . naproxen (NAPROSYN) 500 MG tablet Take 1 tablet (500 mg total) by mouth 2 (two) times daily as needed. 60 tablet 1  . ondansetron (ZOFRAN ODT) 8 MG disintegrating tablet Take 1 tablet (8 mg total) by mouth every 8 (eight) hours as needed for nausea or vomiting. 20 tablet 0  . polyethylene glycol (MIRALAX / GLYCOLAX) packet Take 17 g by mouth daily.      . pregabalin (LYRICA) 150 MG capsule TAKE 1 CAPSULE BY MOUTH THREE TIMES DAILY AS NEEDED 90 capsule 0  . pregabalin (LYRICA) 150 MG capsule TAKE 1 CAPSULE BY MOUTH THREE TIMES DAILY AS NEEDED 90 capsule 0  . silver sulfADIAZINE (SILVADENE) 1 % cream Apply 1 application topically daily. 400 g 2  . silver sulfADIAZINE (SILVADENE) 1 % cream Apply 1 application topically daily. Apply to affected area daily plus dry dressing 400 g 3  . vitamin C (ASCORBIC ACID) 500 MG tablet Take 500 mg by mouth daily.    . [DISCONTINUED] metFORMIN  (GLUCOPHAGE) 1000 MG tablet Take 1,000 mg by mouth 2 (two) times daily with a meal.      . [DISCONTINUED] omeprazole (PRILOSEC) 20 MG capsule Take 20 mg by mouth daily.       No current facility-administered medications on file prior to visit.      Objective:  Objective  Physical Exam  Constitutional: She is oriented to person, place, and time. She appears well-developed and well-nourished.  HENT:  Head: Normocephalic and atraumatic.  Eyes: Conjunctivae and EOM are normal.  Neck: Normal range of motion. Neck supple. No JVD present. Carotid bruit is not present. No thyromegaly present.  Cardiovascular: Normal rate, regular rhythm and normal heart sounds.  No murmur heard. Pulmonary/Chest: Effort normal and breath sounds normal. No respiratory  distress. She has no wheezes. She has no rales. She exhibits no tenderness.  Musculoskeletal: She exhibits no edema.  Neurological: She is alert and oriented to person, place, and time.  Psychiatric: She has a normal mood and affect.  Nursing note and vitals reviewed.  BP (!) 100/56 (BP Location: Right Arm, Cuff Size: Normal)   Pulse 82   Temp 98.4 F (36.9 C) (Oral)   Resp 16   Ht 5' 1"  (1.549 m)   Wt 193 lb 6.4 oz (87.7 kg)   SpO2 97%   BMI 36.54 kg/m  Wt Readings from Last 3 Encounters:  05/06/18 193 lb 6.4 oz (87.7 kg)  03/18/18 192 lb (87.1 kg)  03/07/18 192 lb 3.2 oz (87.2 kg)     Lab Results  Component Value Date   WBC 7.0 11/01/2017   HGB 9.5 (L) 11/01/2017   HCT 30.7 (L) 11/01/2017   PLT 176 11/01/2017   GLUCOSE 161 (H) 03/07/2018   CHOL 97 03/07/2018   TRIG 97.0 03/07/2018   HDL 32.90 (L) 03/07/2018   LDLDIRECT 115.8 01/23/2012   LDLCALC 45 03/07/2018   ALT 15 03/07/2018   AST 18 03/07/2018   NA 138 03/07/2018   K 4.5 03/07/2018   CL 104 03/07/2018   CREATININE 1.64 (H) 03/07/2018   BUN 29 (H) 03/07/2018   CO2 25 03/07/2018   TSH 1.65 01/03/2016   INR 1.16 04/09/2017   HGBA1C 9.9 (H) 03/07/2018   MICROALBUR  8.9 (H) 11/15/2017    Ct Angio Chest Pe W And/or Wo Contrast  Result Date: 11/01/2017 CLINICAL DATA:  Burning sensation in the chest. High pretest probability for pulmonary embolism. EXAM: CT ANGIOGRAPHY CHEST WITH CONTRAST TECHNIQUE: Multidetector CT imaging of the chest was performed using the standard protocol during bolus administration of intravenous contrast. Multiplanar CT image reconstructions and MIPs were obtained to evaluate the vascular anatomy. CONTRAST:  116m ISOVUE-370 IOPAMIDOL (ISOVUE-370) INJECTION 76% COMPARISON:  None. FINDINGS: Cardiovascular: Satisfactory opacification of the pulmonary arteries to the segmental level. No evidence of pulmonary embolism. Borderline heart size. No pericardial effusion. Mediastinum/Nodes: Negative for adenopathy. Nodule projecting posteriorly from the left thyroid, margins not discretely visualized. No convincing size greater than 9 mm, below size threshold for strict imaging follow-up. Patient has history of multinodular goiter based on 2014 thyroid ultrasound. Lungs/Pleura: There is no edema, consolidation, effusion, or pneumothorax. Upper Abdomen: Known cirrhosis. Status post cholecystectomy. Granulomatous type calcifications seen in the liver and spleen. Musculoskeletal: No acute or aggressive finding. Advanced glenohumeral osteoarthritis on the left. Remote left mid clavicle fracture. Review of the MIP images confirms the above findings. IMPRESSION: 1. Negative for pulmonary embolism or other acute finding. 2. Aortic Atherosclerosis (ICD10-I70.0).  Coronary atherosclerosis. 3. Cirrhosis. Electronically Signed   By: JMonte FantasiaM.D.   On: 11/01/2017 22:47   UK[place]Venous Img Lower Unilateral Left  Result Date: 11/01/2017 CLINICAL DATA:  Left thigh pain with redness and swelling for 3 days. EXAM: Left LOWER EXTREMITY VENOUS DOPPLER ULTRASOUND TECHNIQUE: Gray-scale sonography with graded compression, as well as color Doppler and duplex ultrasound were  performed to evaluate the lower extremity deep venous systems from the level of the common femoral vein and including the common femoral, femoral, profunda femoral, popliteal and calf veins including the posterior tibial, peroneal and gastrocnemius veins when visible. The superficial great saphenous vein was also interrogated. Spectral Doppler was utilized to evaluate flow at rest and with distal augmentation maneuvers in the common femoral, femoral and popliteal veins. COMPARISON:  None. FINDINGS: Contralateral Common Femoral Vein: Respiratory phasicity is normal and symmetric with the symptomatic side. No evidence of thrombus. Normal compressibility. Common Femoral Vein: No evidence of thrombus. Normal compressibility, respiratory phasicity and response to augmentation. Saphenofemoral Junction: No evidence of thrombus. Normal compressibility and flow on color Doppler imaging. Profunda Femoral Vein: No evidence of thrombus. Normal compressibility and flow on color Doppler imaging. Femoral Vein: No evidence of thrombus. Normal compressibility, respiratory phasicity and response to augmentation. Popliteal Vein: No evidence of thrombus. Normal compressibility, respiratory phasicity and response to augmentation. Calf Veins: No evidence of thrombus. Normal compressibility and flow on color Doppler imaging. Superficial Great Saphenous Vein: No evidence of thrombus. Normal compressibility. Venous Reflux:  None. Other Findings: Enlarged lymph nodes are demonstrated in the left upper thigh region. Lymph nodes demonstrate fatty hilar architecture suggesting benign reactive nodes. IMPRESSION: No evidence of deep venous thrombosis in the left lower extremity. Left upper thigh lymphadenopathy, likely reactive. Electronically Signed   By: Lucienne Capers M.D.   On: 11/01/2017 22:36     Assessment & Plan:  Plan  I am having [name] [name] maintain her Calcium Carbonate-Vitamin D, multivitamin, polyethylene glycol, CINNAMON  PO, Vitamin D3, Flax Seed Oil, ENSURE PLUS, vitamin C, ferrous sulfate, fenofibrate, ondansetron, collagenase, levofloxacin, naproxen, insulin lispro, cyclobenzaprine, silver sulfADIAZINE, pregabalin, pregabalin, DULoxetine, levocetirizine, fluticasone, montelukast, atorvastatin, silver sulfADIAZINE, hydrochlorothiazide, LYRICA, Insulin Glargine, HYDROcodone-acetaminophen, HYDROcodone-acetaminophen, and HYDROcodone-acetaminophen.  Meds ordered this encounter  Medications  . HYDROcodone-acetaminophen (NORCO/VICODIN) 5-325 MG tablet    Sig: Take 1 tablet by mouth every 6 (six) hours as needed for moderate pain.    Dispense:  90 tablet    Refill:  0    Do not fill until Sept 2019  . HYDROcodone-acetaminophen (NORCO/VICODIN) 5-325 MG tablet    Sig: Take 1 tablet by mouth every 6 (six) hours as needed for moderate pain.    Dispense:  90 tablet    Refill:  0    Do not fill until August 2019  . HYDROcodone-acetaminophen (NORCO/VICODIN) 5-325 MG tablet    Sig: Take 1 to 2 tablets by mouth every 4 hours as needed    Dispense:  90 tablet    Refill:  0    Problem List Items Addressed This Visit      Unprioritized   Fibromyalgia   Relevant Medications   HYDROcodone-acetaminophen (NORCO/VICODIN) 5-325 MG tablet   HYDROcodone-acetaminophen (NORCO/VICODIN) 5-325 MG tablet   HYDROcodone-acetaminophen (NORCO/VICODIN) 5-325 MG tablet   Other Relevant Orders   Pain Mgmt, Profile 8 w/Conf, U   FOOT PAIN, BILATERAL   Relevant Medications   HYDROcodone-acetaminophen (NORCO/VICODIN) 5-325 MG tablet   HYDROcodone-acetaminophen (NORCO/VICODIN) 5-325 MG tablet   HYDROcodone-acetaminophen (NORCO/VICODIN) 5-325 MG tablet    Other Visit Diagnoses    High risk medication use    -  Primary   Relevant Orders   Pain Mgmt, Profile 8 w/Conf, U    pt going to [facility] for her foot -- may have more surgery  Follow-up: Return in about 3 months (around 08/06/2018), or if symptoms worsen or fail to improve, for  hypertension, hyperlipidemia.  Ann Held, DO

## 2018-05-08 LAB — PAIN MGMT, PROFILE 8 W/CONF, U
6 Acetylmorphine: NEGATIVE ng/mL (ref <10)
AMPHETAMINES: NEGATIVE ng/mL (ref <500)
Alcohol Metabolites: NEGATIVE ng/mL (ref <500)
BUPRENORPHINE, URINE: NEGATIVE ng/mL (ref <5)
Benzodiazepines: NEGATIVE ng/mL (ref <100)
Cocaine Metabolite: NEGATIVE ng/mL (ref <150)
Codeine: NEGATIVE ng/mL (ref <50)
Creatinine: 97 mg/dL
HYDROMORPHONE: 62 ng/mL — AB (ref <50)
Hydrocodone: 178 ng/mL — ABNORMAL HIGH (ref <50)
MDMA: NEGATIVE ng/mL (ref <500)
MORPHINE: NEGATIVE ng/mL (ref <50)
Marijuana Metabolite: NEGATIVE ng/mL (ref <20)
Norhydrocodone: 671 ng/mL — ABNORMAL HIGH (ref <50)
OPIATES: POSITIVE ng/mL — AB (ref <100)
OXIDANT: NEGATIVE ug/mL (ref <200)
Oxycodone: NEGATIVE ng/mL (ref <100)
pH: 5.65 (ref 4.5–9.0)

## 2018-05-14 ENCOUNTER — Encounter: Admit: 2018-05-14 | Discharge: 2018-05-14 | Payer: MEDICARE

## 2018-05-14 MED ORDER — FAMOTIDINE 20 MG PO TAB
ORAL_TABLET | Freq: Two times a day (BID) | ORAL | 2 refills | 90.00000 days | Status: AC
Start: 2018-05-14 — End: 2018-12-19

## 2018-05-14 MED ORDER — CLONIDINE HCL 0.1 MG PO TAB
ORAL_TABLET | Freq: Three times a day (TID) | 2 refills | Status: AC
Start: 2018-05-14 — End: 2018-12-19

## 2018-05-20 ENCOUNTER — Other Ambulatory Visit: Payer: Self-pay | Admitting: Family Medicine

## 2018-05-21 ENCOUNTER — Ambulatory Visit (INDEPENDENT_AMBULATORY_CARE_PROVIDER_SITE_OTHER): Payer: PRIVATE HEALTH INSURANCE | Admitting: Orthopedic Surgery

## 2018-05-22 ENCOUNTER — Ambulatory Visit (INDEPENDENT_AMBULATORY_CARE_PROVIDER_SITE_OTHER): Payer: PRIVATE HEALTH INSURANCE | Admitting: Orthopedic Surgery

## 2018-05-22 ENCOUNTER — Encounter (INDEPENDENT_AMBULATORY_CARE_PROVIDER_SITE_OTHER): Payer: Self-pay | Admitting: Orthopedic Surgery

## 2018-05-22 ENCOUNTER — Encounter: Admit: 2018-05-22 | Discharge: 2018-05-22 | Payer: MEDICARE

## 2018-05-22 VITALS — Ht 62.0 in | Wt 193.0 lb

## 2018-05-22 DIAGNOSIS — L97521 Non-pressure chronic ulcer of other part of left foot limited to breakdown of skin: Secondary | ICD-10-CM | POA: Diagnosis not present

## 2018-05-22 DIAGNOSIS — E1142 Type 2 diabetes mellitus with diabetic polyneuropathy: Secondary | ICD-10-CM | POA: Diagnosis not present

## 2018-05-22 DIAGNOSIS — L97421 Non-pressure chronic ulcer of left heel and midfoot limited to breakdown of skin: Secondary | ICD-10-CM

## 2018-05-22 MED ORDER — GLIMEPIRIDE 2 MG PO TAB
ORAL_TABLET | Freq: Every day | 2 refills | Status: AC
Start: 2018-05-22 — End: 2018-08-16
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
## 2018-06-12 ENCOUNTER — Ambulatory Visit (INDEPENDENT_AMBULATORY_CARE_PROVIDER_SITE_OTHER): Payer: PRIVATE HEALTH INSURANCE | Admitting: Orthopedic Surgery

## 2018-06-12 ENCOUNTER — Ambulatory Visit (INDEPENDENT_AMBULATORY_CARE_PROVIDER_SITE_OTHER): Payer: PRIVATE HEALTH INSURANCE | Admitting: Family

## 2018-06-18 ENCOUNTER — Ambulatory Visit (INDEPENDENT_AMBULATORY_CARE_PROVIDER_SITE_OTHER): Payer: PRIVATE HEALTH INSURANCE | Admitting: Orthopedic Surgery

## 2018-06-19 ENCOUNTER — Other Ambulatory Visit: Payer: PRIVATE HEALTH INSURANCE

## 2018-07-02 ENCOUNTER — Ambulatory Visit: Payer: PRIVATE HEALTH INSURANCE | Admitting: Internal Medicine

## 2018-07-02 DIAGNOSIS — Z0289 Encounter for other administrative examinations: Secondary | ICD-10-CM
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
## 2018-07-08 ENCOUNTER — Other Ambulatory Visit: Payer: Self-pay | Admitting: Family Medicine

## 2018-07-09 NOTE — Telephone Encounter (Signed)
Database ran and is on your desk for review.  Last filled per database:  05/20/18 Last written: 05/20/18 Last ov: 05/06/18 Next ov: none Contract: 08/16/18 UDS: 11/06/18

## 2018-07-16 ENCOUNTER — Encounter: Admit: 2018-07-16 | Discharge: 2018-07-16 | Payer: MEDICARE

## 2018-07-16 MED ORDER — CYCLOBENZAPRINE 5 MG PO TAB
ORAL_TABLET | Freq: Every evening | ORAL | 1 refills | 21.00000 days | Status: AC
Start: 2018-07-16 — End: 2019-01-10

## 2018-07-17 ENCOUNTER — Other Ambulatory Visit: Payer: Self-pay | Admitting: Family Medicine

## 2018-07-17 DIAGNOSIS — IMO0002 Reserved for concepts with insufficient information to code with codable children: Secondary | ICD-10-CM

## 2018-07-17 DIAGNOSIS — M797 Fibromyalgia: Secondary | ICD-10-CM

## 2018-07-17 DIAGNOSIS — L97509 Non-pressure chronic ulcer of other part of unspecified foot with unspecified severity: Secondary | ICD-10-CM

## 2018-07-17 DIAGNOSIS — E11621 Type 2 diabetes mellitus with foot ulcer: Secondary | ICD-10-CM

## 2018-07-17 DIAGNOSIS — F419 Anxiety disorder, unspecified: Secondary | ICD-10-CM

## 2018-07-17 DIAGNOSIS — E1165 Type 2 diabetes mellitus with hyperglycemia: Secondary | ICD-10-CM

## 2018-07-17 DIAGNOSIS — Z794 Long term (current) use of insulin: Secondary | ICD-10-CM

## 2018-07-18 ENCOUNTER — Encounter: Admit: 2018-07-18 | Discharge: 2018-07-18 | Payer: MEDICARE

## 2018-07-18 DIAGNOSIS — E1129 Type 2 diabetes mellitus with other diabetic kidney complication: Principal | ICD-10-CM

## 2018-07-30 ENCOUNTER — Ambulatory Visit (INDEPENDENT_AMBULATORY_CARE_PROVIDER_SITE_OTHER): Payer: PRIVATE HEALTH INSURANCE | Admitting: Orthopedic Surgery

## 2018-07-30 ENCOUNTER — Encounter (INDEPENDENT_AMBULATORY_CARE_PROVIDER_SITE_OTHER): Payer: Self-pay | Admitting: Orthopedic Surgery

## 2018-07-30 VITALS — Ht 62.0 in | Wt 193.0 lb

## 2018-07-30 DIAGNOSIS — L97421 Non-pressure chronic ulcer of left heel and midfoot limited to breakdown of skin: Secondary | ICD-10-CM

## 2018-07-30 DIAGNOSIS — E1142 Type 2 diabetes mellitus with diabetic polyneuropathy: Secondary | ICD-10-CM

## 2018-07-30 DIAGNOSIS — L97521 Non-pressure chronic ulcer of other part of left foot limited to breakdown of skin: Secondary | ICD-10-CM

## 2018-07-30 MED ORDER — DOXYCYCLINE HYCLATE 100 MG PO TABS: 100.0000 mg | ORAL_TABLET | Freq: Two times a day (BID) | ORAL | 0 refills | Status: DC
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
## 2018-07-31 ENCOUNTER — Other Ambulatory Visit: Payer: Self-pay | Admitting: Family Medicine

## 2018-07-31 DIAGNOSIS — M79672 Pain in left foot: Secondary | ICD-10-CM

## 2018-07-31 DIAGNOSIS — M797 Fibromyalgia: Secondary | ICD-10-CM

## 2018-07-31 NOTE — Telephone Encounter (Signed)
Copied from [place] [phone]. Topic: Quick Communication - Rx Refill/Question >> Jul 31, 2018  2:21 PM Burchel, Abbi R wrote: Medication: HYDROcodone-acetaminophen (NORCO/VICODIN) 5-325 MG tablet   Preferred Pharmacy: [facility] [address] [place], [place] [address] Phone: [phone] Fax: [phone]    Pt was advised that RX refills may take up to 3 business days. We ask that you follow-up with your pharmacy.

## 2018-07-31 NOTE — Telephone Encounter (Signed)
Requested medication (s) are due for refill today:  yes  Requested medication (s) are on the active medication list: yes  Last refill: due to be refilled in September  Future visit scheduled: no  Notes to clinic: controlled medication. Pt was given prescription and not to be filled until September. Medication is not delegated.   Requested Prescriptions  Pending Prescriptions Disp Refills   HYDROcodone-acetaminophen (NORCO/VICODIN) 5-325 MG tablet 90 tablet 0    Sig: Take 1 tablet by mouth every 6 (six) hours as needed for moderate pain.     Not Delegated - Analgesics:  Opioid Agonist Combinations Failed - 07/31/2018  2:30 PM      Failed - This refill cannot be delegated      Failed - Urine Drug Screen completed in last 360 days.      Passed - Valid encounter within last 6 months    Recent Outpatient Visits          2 months ago High risk medication use   [facility] at [place], DO   4 months ago Essential hypertension   [facility] at [place], DO   6 months ago Medical sales representative at [place], DO   8 months ago DM (diabetes mellitus) type II uncontrolled with eye manifestation [facility])   [facility]ivist at [place], [place]   11 months ago [name] [facility] [employer], [place], DO      Future Appointments            In 6 days [name], MD [employer]

## 2018-08-01 MED ORDER — HYDROCODONE-ACETAMINOPHEN 5-325 MG PO TABS: 1.0000 | ORAL_TABLET | Freq: Four times a day (QID) | ORAL | 0 refills | Status: DC | PRN

## 2018-08-06 ENCOUNTER — Ambulatory Visit (INDEPENDENT_AMBULATORY_CARE_PROVIDER_SITE_OTHER): Payer: PRIVATE HEALTH INSURANCE | Admitting: Orthopedic Surgery

## 2018-08-13 ENCOUNTER — Encounter: Admit: 2018-08-13 | Discharge: 2018-08-13 | Payer: MEDICARE

## 2018-08-15 ENCOUNTER — Ambulatory Visit (INDEPENDENT_AMBULATORY_CARE_PROVIDER_SITE_OTHER): Payer: PRIVATE HEALTH INSURANCE | Admitting: Orthopedic Surgery

## 2018-08-15 ENCOUNTER — Encounter (INDEPENDENT_AMBULATORY_CARE_PROVIDER_SITE_OTHER): Payer: Self-pay | Admitting: Orthopedic Surgery

## 2018-08-15 VITALS — Ht 62.0 in | Wt 193.0 lb

## 2018-08-15 DIAGNOSIS — M869 Osteomyelitis, unspecified: Secondary | ICD-10-CM | POA: Diagnosis not present

## 2018-08-15 DIAGNOSIS — E1165 Type 2 diabetes mellitus with hyperglycemia: Secondary | ICD-10-CM

## 2018-08-15 DIAGNOSIS — M86172 Other acute osteomyelitis, left ankle and foot: Secondary | ICD-10-CM

## 2018-08-15 DIAGNOSIS — Z794 Long term (current) use of insulin: Secondary | ICD-10-CM | POA: Diagnosis not present

## 2018-08-16 ENCOUNTER — Encounter (INDEPENDENT_AMBULATORY_CARE_PROVIDER_SITE_OTHER): Payer: Self-pay | Admitting: Orthopedic Surgery

## 2018-08-16 ENCOUNTER — Ambulatory Visit: Admit: 2018-08-16 | Discharge: 2018-08-17 | Payer: MEDICARE

## 2018-08-16 ENCOUNTER — Encounter: Admit: 2018-08-16 | Discharge: 2018-08-16 | Payer: MEDICARE

## 2018-08-16 DIAGNOSIS — G5600 Carpal tunnel syndrome, unspecified upper limb: ICD-10-CM

## 2018-08-16 DIAGNOSIS — G9050 Complex regional pain syndrome I, unspecified: ICD-10-CM

## 2018-08-16 DIAGNOSIS — D509 Iron deficiency anemia, unspecified: Secondary | ICD-10-CM

## 2018-08-16 DIAGNOSIS — I10 Essential (primary) hypertension: Secondary | ICD-10-CM

## 2018-08-16 DIAGNOSIS — J309 Allergic rhinitis, unspecified: ICD-10-CM

## 2018-08-16 DIAGNOSIS — M79642 Pain in left hand: ICD-10-CM

## 2018-08-16 DIAGNOSIS — R809 Proteinuria, unspecified: Secondary | ICD-10-CM

## 2018-08-16 DIAGNOSIS — E559 Vitamin D deficiency, unspecified: ICD-10-CM

## 2018-08-16 DIAGNOSIS — K589 Irritable bowel syndrome without diarrhea: ICD-10-CM

## 2018-08-16 DIAGNOSIS — D649 Anemia, unspecified: ICD-10-CM

## 2018-08-16 DIAGNOSIS — K5900 Constipation, unspecified: ICD-10-CM

## 2018-08-16 DIAGNOSIS — J45909 Unspecified asthma, uncomplicated: ICD-10-CM

## 2018-08-16 DIAGNOSIS — E119 Type 2 diabetes mellitus without complications: ICD-10-CM

## 2018-08-16 DIAGNOSIS — K2970 Gastritis, unspecified, without bleeding: Principal | ICD-10-CM

## 2018-08-16 DIAGNOSIS — Z789 Other specified health status: Secondary | ICD-10-CM

## 2018-08-16 DIAGNOSIS — E785 Hyperlipidemia, unspecified: ICD-10-CM

## 2018-08-16 LAB — COMPREHENSIVE METABOLIC PANEL
Lab: 109 MMOL/L — ABNORMAL LOW (ref 98–110)
Lab: 11 U/L (ref 7–56)
Lab: 14 10*3/uL — ABNORMAL HIGH (ref 3–12)
Lab: 145 MMOL/L (ref 137–147)
Lab: 17 U/L (ref 7–40)
Lab: 22 MMOL/L (ref 21–30)
Lab: 4.2 MMOL/L — ABNORMAL LOW (ref 3.5–5.1)
Lab: 42 mL/min — ABNORMAL LOW (ref >60)
Lab: 86 U/L (ref 25–110)

## 2018-08-16 LAB — CBC AND DIFF
Lab: 0.1 10*3/uL (ref 0–0.20)
Lab: 0.3 10*3/uL (ref 0–0.45)
Lab: 0.6 K/UL — ABNORMAL LOW (ref >60)
Lab: 24 pg — ABNORMAL LOW (ref 26–34)
Lab: 74 FL — ABNORMAL LOW (ref 80–100)
Lab: 8.4 10*3/uL — ABNORMAL LOW (ref 4.5–11.0)

## 2018-08-16 LAB — IRON + BINDING CAPACITY + %SAT+ FERRITIN
Lab: 27 ug/dL — ABNORMAL LOW (ref 50–160)
Lab: 550 ug/dL — ABNORMAL HIGH (ref 270–380)

## 2018-08-16 MED ORDER — TRAZODONE 50 MG PO TAB
50 mg | ORAL_TABLET | Freq: Every evening | ORAL | 3 refills | Status: AC | PRN
Start: 2018-08-16 — End: 2019-07-03

## 2018-08-16 MED ORDER — BETAMETHASONE VALERATE 0.1 % TP OINT
Freq: Every day | TOPICAL | 3 refills | 30.00000 days | Status: AC
Start: 2018-08-16 — End: 2019-06-26

## 2018-08-16 MED ORDER — AMLODIPINE 10 MG PO TAB
10 mg | ORAL_TABLET | Freq: Every day | ORAL | 3 refills | Status: AC
Start: 2018-08-16 — End: 2019-06-26
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
## 2018-08-17 DIAGNOSIS — Z23 Encounter for immunization: ICD-10-CM

## 2018-08-17 DIAGNOSIS — E1129 Type 2 diabetes mellitus with other diabetic kidney complication: Principal | ICD-10-CM

## 2018-08-17 DIAGNOSIS — E785 Hyperlipidemia, unspecified: Secondary | ICD-10-CM

## 2018-08-17 LAB — HEMOGLOBIN A1C: Lab: 7.5 % — ABNORMAL HIGH (ref 4.0–6.0)

## 2018-08-18 ENCOUNTER — Other Ambulatory Visit: Payer: Self-pay | Admitting: Family Medicine

## 2018-08-18 ENCOUNTER — Encounter: Admit: 2018-08-18 | Discharge: 2018-08-18 | Payer: MEDICARE

## 2018-08-18 DIAGNOSIS — E785 Hyperlipidemia, unspecified: Secondary | ICD-10-CM

## 2018-08-18 DIAGNOSIS — F32A Depression, unspecified: Secondary | ICD-10-CM

## 2018-08-18 DIAGNOSIS — F329 Major depressive disorder, single episode, unspecified: Secondary | ICD-10-CM

## 2018-08-21 ENCOUNTER — Telehealth: Payer: Self-pay

## 2018-08-21 DIAGNOSIS — M869 Osteomyelitis, unspecified: Secondary | ICD-10-CM

## 2018-08-21 NOTE — Telephone Encounter (Signed)
Copied from [place] [id number]. Topic: Referral - Request for Referral >> Aug 21, 2018  3:37 PM Carolyn Stare wrote: Has patient seen PCP for this complaint yes        pt said she would like to get a 2nd opinion on her foot being amputated  and has had a conversation with Dr Carollee Herter  about seeing another doctor  Referral for which specialty  bone doctor    Preferred provider office   N/A    Reason for referral   left foot heel

## 2018-08-22 ENCOUNTER — Other Ambulatory Visit: Payer: Self-pay | Admitting: Family Medicine

## 2018-08-23 NOTE — Telephone Encounter (Signed)
Requesting:ly[place] Contract:yes UDS:low risk next screen 11/06/18 Last OV:05/06/18 Next OV:not scheduled  Last Refill:11/05/17 #[id number] Database:  [place] patient please advise

## 2018-08-26 NOTE — Telephone Encounter (Signed)
Patient notified. She would like to go to [place].  Referral placed.

## 2018-08-26 NOTE — Telephone Encounter (Signed)
Does she want to stay in [place] or go to [place] / [facility] ?

## 2018-08-26 NOTE — Addendum Note (Signed)
Addended by: Kem Boroughs D on: 08/26/2018 10:32 AM   Modules accepted: Orders

## 2018-08-28 ENCOUNTER — Encounter: Admit: 2018-08-28 | Discharge: 2018-08-28 | Payer: MEDICARE

## 2018-08-28 MED ORDER — METFORMIN 1,000 MG PO TAB
ORAL_TABLET | Freq: Two times a day (BID) | 1 refills | Status: AC
Start: 2018-08-28 — End: 2019-02-19

## 2018-08-28 MED ORDER — PANTOPRAZOLE 40 MG PO TBEC
ORAL_TABLET | Freq: Every day | ORAL | 1 refills | 90.00000 days | Status: AC
Start: 2018-08-28 — End: 2019-06-26

## 2018-08-30 ENCOUNTER — Telehealth: Payer: Self-pay | Admitting: Family Medicine

## 2018-08-30 ENCOUNTER — Other Ambulatory Visit: Payer: Self-pay | Admitting: Family Medicine

## 2018-08-30 DIAGNOSIS — M797 Fibromyalgia: Secondary | ICD-10-CM

## 2018-08-30 DIAGNOSIS — M79672 Pain in left foot: Secondary | ICD-10-CM

## 2018-08-30 MED ORDER — HYDROCODONE-ACETAMINOPHEN 5-325 MG PO TABS: 1.0000 | ORAL_TABLET | Freq: Four times a day (QID) | ORAL | 0 refills | Status: DC | PRN

## 2018-08-30 MED ORDER — HYDROCODONE-ACETAMINOPHEN 5-325 MG PO TABS: ORAL_TABLET | ORAL | 0 refills | Status: DC

## 2018-08-30 NOTE — Telephone Encounter (Signed)
Database ran 07/09/18 and is in media for review  Last written: 08/01/18 Last ov: 05/06/18 Next ov: none Contract: 05/07/19 UDS: 11/06/18

## 2018-08-30 NOTE — Telephone Encounter (Signed)
Copied from [place] [id number]. Topic: Quick Communication - Rx Refill/Question >> Aug 30, 2018  3:19 PM Rutherford Nail, NT wrote: **Patient calling and states that the pharmacy cannot find her prescription. States that Dr Etter Sjogren always puts it in 3 months at a time and she knows that one was sent. States that she is going out of town tonight to go to [place] and does not have the medication for the weekend. Please advise**  Medication: HYDROcodone-acetaminophen (NORCO/VICODIN) 5-325 MG tablet  Has the patient contacted their pharmacy? Yes.   (Agent: If no, request that the patient contact the pharmacy for the refill.) (Agent: If yes, when and what did the pharmacy advise?)  Preferred Pharmacy (with phone number or street name): [place], [place]: Please be advised that RX refills may take up to 3 business days. We ask that you follow-up with your pharmacy.

## 2018-08-30 NOTE — Telephone Encounter (Signed)
I have sent them to the pharmacy

## 2018-09-02 NOTE — Telephone Encounter (Signed)
Patient did pickup med on Friday

## 2018-09-03 ENCOUNTER — Encounter: Admit: 2018-09-03 | Discharge: 2018-09-03 | Payer: MEDICARE

## 2018-09-03 DIAGNOSIS — A048 Other specified bacterial intestinal infections: Principal | ICD-10-CM

## 2018-10-01 ENCOUNTER — Encounter: Admit: 2018-10-01 | Discharge: 2018-10-01 | Payer: MEDICARE

## 2018-10-01 MED ORDER — ALBUTEROL SULFATE 90 MCG/ACTUATION IN HFAA
2 | RESPIRATORY_TRACT | 3 refills | Status: AC | PRN
Start: 2018-10-01 — End: 2020-01-22

## 2018-10-01 MED ORDER — AZITHROMYCIN 250 MG PO TAB
ORAL_TABLET | Freq: Every day | ORAL | 0 refills | Status: AC
Start: 2018-10-01 — End: ?

## 2018-10-10 ENCOUNTER — Other Ambulatory Visit: Payer: Self-pay | Admitting: Family Medicine

## 2018-10-24 ENCOUNTER — Other Ambulatory Visit: Payer: Self-pay | Admitting: Family Medicine

## 2018-10-24 DIAGNOSIS — L97509 Non-pressure chronic ulcer of other part of unspecified foot with unspecified severity: Principal | ICD-10-CM

## 2018-10-24 DIAGNOSIS — Z794 Long term (current) use of insulin: Principal | ICD-10-CM

## 2018-10-24 DIAGNOSIS — IMO0002 Reserved for concepts with insufficient information to code with codable children: Secondary | ICD-10-CM

## 2018-10-24 DIAGNOSIS — E11621 Type 2 diabetes mellitus with foot ulcer: Secondary | ICD-10-CM

## 2018-10-24 DIAGNOSIS — E1165 Type 2 diabetes mellitus with hyperglycemia: Principal | ICD-10-CM

## 2018-11-12 ENCOUNTER — Encounter (HOSPITAL_BASED_OUTPATIENT_CLINIC_OR_DEPARTMENT_OTHER): Payer: PRIVATE HEALTH INSURANCE | Attending: Internal Medicine

## 2018-11-12 DIAGNOSIS — I2510 Atherosclerotic heart disease of native coronary artery without angina pectoris: Secondary | ICD-10-CM | POA: Insufficient documentation

## 2018-11-12 DIAGNOSIS — E11319 Type 2 diabetes mellitus with unspecified diabetic retinopathy without macular edema: Secondary | ICD-10-CM | POA: Insufficient documentation

## 2018-11-12 DIAGNOSIS — Z8614 Personal history of Methicillin resistant Staphylococcus aureus infection: Secondary | ICD-10-CM | POA: Insufficient documentation

## 2018-11-12 DIAGNOSIS — E1140 Type 2 diabetes mellitus with diabetic neuropathy, unspecified: Secondary | ICD-10-CM | POA: Insufficient documentation

## 2018-11-12 DIAGNOSIS — L97521 Non-pressure chronic ulcer of other part of left foot limited to breakdown of skin: Secondary | ICD-10-CM | POA: Insufficient documentation

## 2018-11-12 DIAGNOSIS — I252 Old myocardial infarction: Secondary | ICD-10-CM | POA: Insufficient documentation

## 2018-11-12 DIAGNOSIS — Z89421 Acquired absence of other right toe(s): Secondary | ICD-10-CM | POA: Insufficient documentation

## 2018-11-12 DIAGNOSIS — I10 Essential (primary) hypertension: Secondary | ICD-10-CM | POA: Insufficient documentation

## 2018-11-12 DIAGNOSIS — E11621 Type 2 diabetes mellitus with foot ulcer: Secondary | ICD-10-CM | POA: Insufficient documentation

## 2018-11-12 DIAGNOSIS — L97423 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: Secondary | ICD-10-CM | POA: Diagnosis not present

## 2018-11-15 ENCOUNTER — Other Ambulatory Visit: Payer: Self-pay | Admitting: Internal Medicine

## 2018-11-20 ENCOUNTER — Other Ambulatory Visit (HOSPITAL_COMMUNITY): Payer: Self-pay | Admitting: Internal Medicine

## 2018-11-20 ENCOUNTER — Other Ambulatory Visit: Payer: Self-pay | Admitting: Internal Medicine

## 2018-11-20 DIAGNOSIS — L97423 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: Secondary | ICD-10-CM

## 2018-11-20 DIAGNOSIS — E11621 Type 2 diabetes mellitus with foot ulcer: Secondary | ICD-10-CM | POA: Diagnosis not present

## 2018-11-23 ENCOUNTER — Encounter (HOSPITAL_COMMUNITY): Payer: Self-pay

## 2018-11-23 ENCOUNTER — Ambulatory Visit (HOSPITAL_COMMUNITY)
Admission: RE | Admit: 2018-11-23 | Discharge: 2018-11-23 | Disposition: A | Discharge status: Home / Self Care | Payer: PRIVATE HEALTH INSURANCE | Source: Ambulatory Visit | Attending: Internal Medicine | Admitting: Internal Medicine

## 2018-11-23 DIAGNOSIS — L97423 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: Secondary | ICD-10-CM

## 2018-11-26 ENCOUNTER — Ambulatory Visit (HOSPITAL_BASED_OUTPATIENT_CLINIC_OR_DEPARTMENT_OTHER)
Admission: RE | Admit: 2018-11-26 | Discharge: 2018-11-26 | Disposition: A | Discharge status: Home / Self Care | Payer: PRIVATE HEALTH INSURANCE | Source: Ambulatory Visit | Attending: Family Medicine | Admitting: Family Medicine

## 2018-11-26 ENCOUNTER — Encounter: Payer: Self-pay | Admitting: Family Medicine

## 2018-11-26 ENCOUNTER — Ambulatory Visit (INDEPENDENT_AMBULATORY_CARE_PROVIDER_SITE_OTHER): Payer: PRIVATE HEALTH INSURANCE | Admitting: Family Medicine

## 2018-11-26 VITALS — BP 132/68 | HR 72 | Resp 12 | Ht 62.0 in | Wt 182.0 lb

## 2018-11-26 DIAGNOSIS — M2550 Pain in unspecified joint: Secondary | ICD-10-CM | POA: Diagnosis not present

## 2018-11-26 DIAGNOSIS — L97509 Non-pressure chronic ulcer of other part of unspecified foot with unspecified severity: Secondary | ICD-10-CM

## 2018-11-26 DIAGNOSIS — I10 Essential (primary) hypertension: Secondary | ICD-10-CM

## 2018-11-26 DIAGNOSIS — E1169 Type 2 diabetes mellitus with other specified complication: Secondary | ICD-10-CM | POA: Diagnosis not present

## 2018-11-26 DIAGNOSIS — IMO0002 Reserved for concepts with insufficient information to code with codable children: Secondary | ICD-10-CM

## 2018-11-26 DIAGNOSIS — R0789 Other chest pain: Secondary | ICD-10-CM | POA: Diagnosis present

## 2018-11-26 DIAGNOSIS — M79606 Pain in leg, unspecified: Secondary | ICD-10-CM

## 2018-11-26 DIAGNOSIS — Z794 Long term (current) use of insulin: Secondary | ICD-10-CM

## 2018-11-26 DIAGNOSIS — M79672 Pain in left foot: Secondary | ICD-10-CM

## 2018-11-26 DIAGNOSIS — N183 Chronic kidney disease, stage 3 unspecified: Secondary | ICD-10-CM

## 2018-11-26 DIAGNOSIS — M797 Fibromyalgia: Secondary | ICD-10-CM | POA: Diagnosis not present

## 2018-11-26 DIAGNOSIS — Z0000 Encounter for general adult medical examination without abnormal findings: Secondary | ICD-10-CM

## 2018-11-26 DIAGNOSIS — M869 Osteomyelitis, unspecified: Secondary | ICD-10-CM

## 2018-11-26 DIAGNOSIS — E785 Hyperlipidemia, unspecified: Secondary | ICD-10-CM | POA: Diagnosis not present

## 2018-11-26 DIAGNOSIS — E1165 Type 2 diabetes mellitus with hyperglycemia: Secondary | ICD-10-CM

## 2018-11-26 DIAGNOSIS — E1139 Type 2 diabetes mellitus with other diabetic ophthalmic complication: Secondary | ICD-10-CM

## 2018-11-26 DIAGNOSIS — L97421 Non-pressure chronic ulcer of left heel and midfoot limited to breakdown of skin: Secondary | ICD-10-CM

## 2018-11-26 DIAGNOSIS — E11621 Type 2 diabetes mellitus with foot ulcer: Secondary | ICD-10-CM

## 2018-11-26 MED ORDER — INSULIN GLARGINE 100 UNIT/ML SOLOSTAR PEN: PEN_INJECTOR | SUBCUTANEOUS | 1 refills | Status: DC

## 2018-11-26 MED ORDER — HYDROCODONE-ACETAMINOPHEN 5-325 MG PO TABS: ORAL_TABLET | ORAL | 0 refills | Status: DC

## 2018-11-26 MED ORDER — INSULIN LISPRO 200 UNIT/ML ~~LOC~~ SOPN: 13.0000 [IU] | PEN_INJECTOR | Freq: Every day | SUBCUTANEOUS | 3 refills | Status: DC

## 2018-11-26 MED ORDER — HYDROCODONE-ACETAMINOPHEN 5-325 MG PO TABS: 1.0000 | ORAL_TABLET | Freq: Four times a day (QID) | ORAL | 0 refills | Status: DC | PRN

## 2018-11-26 NOTE — Patient Instructions (Signed)
Chest Wall Pain Chest wall pain is pain in or around the bones and muscles of your chest. Sometimes, an injury causes this pain. Excessive coughing or overuse of arm and chest muscles may also cause chest wall pain. Sometimes, the cause may not be known. This pain may take several weeks or longer to get better. Follow these instructions at home: Managing pain, stiffness, and swelling   If directed, put ice on the painful area: ? Put ice in a plastic bag. ? Place a towel between your skin and the bag. ? Leave the ice on for 20 minutes, 2-3 times per day. Activity  Rest as told by your health care provider.  Avoid activities that cause pain. These include any activities that use your chest muscles or your abdominal and side muscles to lift heavy items. Ask your health care provider what activities are safe for you. General instructions   Take over-the-counter and prescription medicines only as told by your health care provider.  Do not use any products that contain nicotine or tobacco, such as cigarettes, e-cigarettes, and chewing tobacco. These can delay healing after injury. If you need help quitting, ask your health care provider.  Keep all follow-up visits as told by your health care provider. This is important. Contact a health care provider if:  You have a fever.  Your chest pain becomes worse.  You have new symptoms. Get help right away if:  You have nausea or vomiting.  You feel sweaty or light-headed.  You have a cough with mucus from your lungs (sputum) or you cough up blood.  You develop shortness of breath. These symptoms may represent a serious problem that is an emergency. Do not wait to see if the symptoms will go away. Get medical help right away. Call your local emergency services (911 in the [place] Do not drive yourself to the hospital. Summary  Chest wall pain is pain in or around the bones and muscles of your chest.  Depending on the cause, it may be  treated with ice, rest, medicines, and avoiding activities that cause pain.  Contact a health care provider if you have a fever, worsening chest pain, or new symptoms.  Get help right away if you feel light-headed or you develop shortness of breath. These symptoms may be an emergency. This information is not intended to replace advice given to you by your health care provider. Make sure you discuss any questions you have with your health care provider. Document Released: 10/16/2005 Document Revised: 04/18/2018 Document Reviewed: 04/18/2018 Elsevier Interactive Patient Education  2019 [employer].

## 2018-11-26 NOTE — Progress Notes (Signed)
+ [name], female    DOB: [birth year]  Age: 64 y.o. MRN: [id number]    Subjective:  Subjective  HPI [name] [name] presents for know upper med abd that is painful x 1 week.  No other symptoms She also needs med refill.  Pain is worsening ---  Pain management would not take her previously due to eye and foot problems.    HPI HYPERTENSION   Blood pressure range-not checking   Chest pain- no      Dyspnea- no Lightheadedness- no   Edema- no  Other side effects - no   Medication compliance: good Low salt diet- yes    DIABETES    Blood Sugar ranges-running high per pt  Polyuria- no New Visual problems- no  Hypoglycemic symptoms- no  Other side effects-no Medication compliance - good Last eye exam- goes regularly Foot exam- per ortho--L foot in brace and wrapped    HYPERLIPIDEMIA  Medication compliance- good RUQ pain- no  Muscle aches- no Other side effects-no     Review of Systems  Constitutional: Negative for appetite change, diaphoresis, fatigue and unexpected weight change.  Eyes: Negative for pain, redness and visual disturbance.  Respiratory: Negative for cough, chest tightness, shortness of breath and wheezing.   Cardiovascular: Negative for chest pain, palpitations and leg swelling.  Gastrointestinal: Positive for abdominal pain. Negative for blood in stool, constipation, diarrhea, nausea, rectal pain and vomiting.  Endocrine: Negative for cold intolerance, heat intolerance, polydipsia, polyphagia and polyuria.  Genitourinary: Negative for difficulty urinating, dysuria and frequency.  Neurological: Negative for dizziness, light-headedness, numbness and headaches.    History Past Medical History:  Diagnosis Date  . Acute MI [facility]) 2007   presented to ED & had cardiac cath- but found to have normal coronaries. Since that point in time her PCP cares f or cardiac needs. Dr. Archie [name] - [facility]  . Anemia   . Anginal pain ([place])   .  Anxiety   . Asthma   . Bulging lumbar disc   . Cataract   . 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 Cirrhosis of liver without mention of alcohol   . Constipation   . Dehiscence of closure of skin    left partial calcaneal excision  . Depression   . Diabetes mellitus    insulin dependent, adult onset  . Episode of visual loss of left eye   . Exertional shortness of breath   . Fatty liver   . Fibromyalgia   . GERD (gastroesophageal reflux disease)   . Hepatic steatosis   . High cholesterol   . Hypertension   . MRSA (methicillin resistant Staphylococcus aureus)   . Neuropathy    lower legs  . Osteoarthritis    hands, hips  . Proximal humerus fracture 10/15/12   Left  . PTSD (post-traumatic stress disorder)   . 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   Her family history includes Cancer in her father; Colitis in her father; Crohn's disease in her father; Diabetes  in her father and mother; Diabetes Mellitus I in her brother; Diabetes Mellitus II in her brother, brother, brother, and brother; Heart attack in her brother; Heart disease in her brother, brother, and father; Hypertension in her mother; Irritable bowel syndrome in her daughter; Kidney disease in her brother, brother, brother, brother, and brother; Leukemia in her father; Liver disease in her brother and brother; Mental illness in her mother.She reports that  she has never smoked. She has never used smokeless tobacco. She reports that she does not drink alcohol or use drugs.  Current Outpatient Medications on File Prior to Visit  Medication Sig Dispense Refill  . atorvastatin (LIPITOR) 10 MG tablet TAKE 1 TABLET BY MOUTH ONCE DAILY 90 tablet 1  . Calcium Carbonate-Vitamin D (CALTRATE 600+D) 600-400 MG-UNIT per tablet Take 1 tablet by mouth daily.     . Cholecalciferol (VITAMIN D3) 5000 UNITS CAPS Take 1 capsule by mouth 2 (two) times daily.    [name] Kitchen CINNAMON PO Take 1,000 mg by mouth daily.    . collagenase (SANTYL) ointment Apply topically daily. 15 g 0  . cyclobenzaprine (FLEXERIL) 10 MG tablet TAKE 1 TABLET BY MOUTH THREE TIMES DAILY AS NEEDED FOR MUSCLE SPASM 60 tablet 1  . doxycycline (VIBRA-TABS) 100 MG tablet Take 1 tablet (100 mg total) by mouth 2 (two) times daily. 60 tablet 0  . DULoxetine (CYMBALTA) 60 MG capsule TAKE 1 CAPSULE BY MOUTH ONCE DAILY 30 capsule 5  . ENSURE PLUS (ENSURE PLUS) LIQD Take 237 mLs by mouth daily.     . fenofibrate 160 MG tablet Take 1 tablet (160 mg total) by mouth daily. 30 tablet 5  . ferrous sulfate (SLOW FE) 160 (50 FE) MG TBCR SR tablet Take 160 mg by mouth daily.    . Flaxseed, Linseed, (FLAX SEED OIL) 1000 MG CAPS Take 1 capsule by mouth at bedtime.    . fluticasone (FLONASE) 50 MCG/ACT nasal spray Place 2 sprays into both nostrils daily. 16 g 6  . hydrochlorothiazide (HYDRODIURIL) 25 MG tablet Take 1 tablet (25 mg total) by mouth daily. 90 tablet 1  . insulin lispro (HUMALOG KWIKPEN) 100 UNIT/ML KiwkPen Inject 0.13 mLs (13 Units total) into the skin 3 (three) times daily. 15 mL 11  . levocetirizine (XYZAL) 5 MG tablet Take 1 tablet (5 mg total) by mouth every evening. 30 tablet 5  . levofloxacin (LEVAQUIN) 750 MG tablet Take 1 tablet (750 mg total) by mouth every other day. 30 tablet 0  . montelukast (SINGULAIR) 10 MG tablet Take 1 tablet (10 mg total) by mouth at bedtime. 30 tablet 3  . Multiple Vitamin  (MULTIVITAMIN) tablet Take 1 tablet by mouth daily.     . naproxen (NAPROSYN) 500 MG tablet Take 1 tablet (500 mg total) by mouth 2 (two) times daily as needed. 60 tablet 1  . ondansetron (ZOFRAN ODT) 8 MG disintegrating tablet Take 1 tablet (8 mg total) by mouth every 8 (eight) hours as needed for nausea or vomiting. 20 tablet 0  . polyethylene glycol (MIRALAX / GLYCOLAX) packet Take 17 g by mouth daily.      . pregabalin (LYRICA) 150 MG capsule TAKE 1 CAPSULE BY MOUTH THREE TIMES DAILY AS NEEDED 90 capsule 0  . pregabalin (LYRICA) 150 MG capsule TAKE 1 CAPSULE BY MOUTH THREE TIMES DAILY AS NEEDED 90 capsule 0  . pregabalin (LYRICA) 150 MG capsule TAKE 1 CAPSULE BY MOUTH THREE TIMES DAILY 90 capsule 0  . silver sulfADIAZINE (SILVADENE) 1 % cream  Apply 1 application topically daily. 400 g 2  . silver sulfADIAZINE (SILVADENE) 1 % cream Apply 1 application topically daily. Apply to affected area daily plus dry dressing 400 g 3  . vitamin C (ASCORBIC ACID) 500 MG tablet Take 500 mg by mouth daily.    . [DISCONTINUED] metFORMIN (GLUCOPHAGE) 1000 MG tablet Take 1,000 mg by mouth 2 (two) times daily with a meal.      . [DISCONTINUED] omeprazole (PRILOSEC) 20 MG capsule Take 20 mg by mouth daily.       No current facility-administered medications on file prior to visit.      Objective:  Objective  Physical Exam Vitals signs and nursing note reviewed.  Constitutional:      Appearance: She is well-developed.  HENT:     Head: Normocephalic and atraumatic.  Eyes:     Conjunctiva/sclera: Conjunctivae normal.  Neck:     Musculoskeletal: Normal range of motion and neck supple.     Thyroid: No thyromegaly.     Vascular: No carotid bruit or JVD.  Cardiovascular:     Rate and Rhythm: Normal rate and regular rhythm.     Heart sounds: Normal heart sounds. No murmur.  Pulmonary:     Effort: Pulmonary effort is normal. No respiratory distress.     Breath sounds: Normal breath sounds. No wheezing or  rales.  Chest:     Chest wall: No tenderness.  Abdominal:     General: There is no distension.     Palpations: There is mass.     Tenderness: There is abdominal tenderness. There is no guarding or rebound.    Neurological:     Mental Status: She is alert and oriented to person, place, and time.    BP 132/68   Pulse 72   Resp 12   Ht 5' 2"  (1.575 m)   Wt 182 lb (82.6 kg)   SpO2 98%   BMI 33.29 kg/m  Wt Readings from Last 3 Encounters:  11/26/18 182 lb (82.6 kg)  08/15/18 193 lb (87.5 kg)  07/30/18 193 lb (87.5 kg)     Lab Results  Component Value Date   WBC 7.0 11/01/2017   HGB 9.5 (L) 11/01/2017   HCT 30.7 (L) 11/01/2017   PLT 176 11/01/2017   GLUCOSE 161 (H) 03/07/2018   CHOL 175 11/26/2018   TRIG 168.0 (H) 11/26/2018   HDL 33.00 (L) 11/26/2018   LDLDIRECT 115.8 01/23/2012   LDLCALC 108 (H) 11/26/2018   ALT 15 03/07/2018   AST 18 03/07/2018   NA 138 03/07/2018   K 4.5 03/07/2018   CL 104 03/07/2018   CREATININE 1.64 (H) 03/07/2018   BUN 29 (H) 03/07/2018   CO2 25 03/07/2018   TSH 1.65 01/03/2016   INR 1.16 04/09/2017   HGBA1C 10.3 (H) 11/26/2018   MICROALBUR 26.3 (H) 11/26/2018    No results found.   Assessment & Plan:  Plan  I am having [name] [name] start on Insulin Lispro. I am also having her maintain her Calcium Carbonate-Vitamin D, multivitamin, polyethylene glycol, CINNAMON PO, Vitamin D3, Flax Seed Oil, ENSURE PLUS, vitamin C, ferrous sulfate, fenofibrate, ondansetron, collagenase, levofloxacin, naproxen, insulin lispro, cyclobenzaprine, silver sulfADIAZINE, pregabalin, pregabalin, levocetirizine, fluticasone, montelukast, silver sulfADIAZINE, hydrochlorothiazide, DULoxetine, doxycycline, atorvastatin, pregabalin, HYDROcodone-acetaminophen, HYDROcodone-acetaminophen, HYDROcodone-acetaminophen, and Insulin Glargine.  Meds ordered this encounter  Medications  . DISCONTD: HYDROcodone-acetaminophen (NORCO/VICODIN) 5-325 MG tablet    Sig: Take 1 to  2 tablets by mouth every 4 hours as needed  Dispense:  120 tablet    Refill:  0    Do not fill until March 2020  . DISCONTD: HYDROcodone-acetaminophen (NORCO/VICODIN) 5-325 MG tablet    Sig: Take 1 tablet by mouth every 6 (six) hours as needed for moderate pain.    Dispense:  120 tablet    Refill:  0    Do not fill until feb 2020  . DISCONTD: HYDROcodone-acetaminophen (NORCO/VICODIN) 5-325 MG tablet    Sig: Take 1 tablet by mouth every 6 (six) hours as needed for moderate pain.    Dispense:  120 tablet    Refill:  0  . HYDROcodone-acetaminophen (NORCO/VICODIN) 5-325 MG tablet    Sig: Take 1 to 2 tablets by mouth every 4 hours as needed    Dispense:  120 tablet    Refill:  0    Do not fill until March 2020  . HYDROcodone-acetaminophen (NORCO/VICODIN) 5-325 MG tablet    Sig: Take 1 tablet by mouth every 6 (six) hours as needed for moderate pain.    Dispense:  120 tablet    Refill:  0    Do not fill until feb 2020  . HYDROcodone-acetaminophen (NORCO/VICODIN) 5-325 MG tablet    Sig: Take 1 tablet by mouth every 6 (six) hours as needed for moderate pain.    Dispense:  120 tablet    Refill:  0  . Insulin Lispro (HUMALOG KWIKPEN) 200 UNIT/ML SOPN    Sig: Inject 13 Units into the skin daily.    Dispense:  7 pen    Refill:  3  . Insulin Glargine (LANTUS SOLOSTAR) 100 UNIT/ML Solostar Pen    Sig: INJECT 20 UNITS SUBCUTANEOUSLY ONCE DAILY IN THE MORNING AND THEN INJECT 40 UNITS AT BEDTIME    Dispense:  45 mL    Refill:  1    Problem List Items Addressed This Visit      Unprioritized   CKD (chronic kidney disease), stage III [facility])    Per nephrology      Essential hypertension    Well controlled, no changes to meds. Encouraged heart healthy diet such as the DASH diet and exercise as tolerated.       Fibromyalgia    Pain worse Check labs-- may need rheum again Inc # opiod for now and new referral in for pain management      Relevant Medications   HYDROcodone-acetaminophen  (NORCO/VICODIN) 5-325 MG tablet   HYDROcodone-acetaminophen (NORCO/VICODIN) 5-325 MG tablet   HYDROcodone-acetaminophen (NORCO/VICODIN) 5-325 MG tablet   Other Relevant Orders   Ambulatory referral to Pain Clinic   FOOT PAIN, BILATERAL   Relevant Medications   HYDROcodone-acetaminophen (NORCO/VICODIN) 5-325 MG tablet   HYDROcodone-acetaminophen (NORCO/VICODIN) 5-325 MG tablet   HYDROcodone-acetaminophen (NORCO/VICODIN) 5-325 MG tablet   Hyperlipidemia    Tolerating statin, encouraged heart healthy diet, avoid trans fats, minimize simple carbs and saturated fats. 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[place])    Per [facility]      Type 2 diabetes mellitus with hyperglycemia, with long-term current use of insulin (HCC)    Per [name]      Relevant Medications   Insulin Lispro (HUMALOG KWIKPEN) 200 UNIT/ML SOPN   Insulin Glargine (LANTUS SOLOSTAR) 100 UNIT/ML Solostar Pen   Ulcer of left heel, limited to breakdown of skin ([place])    Per [facility]       Other Visit Diagnoses    Arthralgia, unspecified joint    -  Primary   Relevant Orders   Rheumatoid Factor   Antinuclear Antib (ANA)   Sedimentation rate (Completed)   Ambulatory referral to Pain Clinic   Other chest pain       Relevant Orders   CT Chest Wo Contrast   DG Chest 2 View (Completed)   Lower extremity pain, diffuse, unspecified laterality       Relevant Orders   US Venous Img Lower Bilateral (Completed)   DM (diabetes mellitus) type II uncontrolled with eye manifestation (HCC)       Relevant Medications   Insulin Lispro (HUMALOG KWIKPEN) 200 UNIT/ML SOPN   Insulin Glargine (LANTUS SOLOSTAR) 100 UNIT/ML Solostar Pen   Other Relevant Orders   Hemoglobin A1c (Completed)   Microalbumin / creatinine urine ratio (Completed)   Hyperlipidemia associated with type 2 diabetes mellitus (HCC)       Relevant Medications   Insulin Lispro (HUMALOG KWIKPEN) 200 UNIT/ML SOPN   Insulin Glargine  (LANTUS SOLOSTAR) 100 UNIT/ML Solostar Pen   Other Relevant Orders   Lipid panel (Completed)   Preventative health care       Relevant Orders   MM 3D SCREEN BREAST BILATERAL   Uncontrolled type 2 diabetes mellitus with foot ulcer, with long-term current use of insulin (HCC)       Relevant Medications   Insulin Lispro (HUMALOG KWIKPEN) 200 UNIT/ML SOPN   Insulin Glargine (LANTUS SOLOSTAR) 100 UNIT/ML Solostar Pen      Follow-up: No follow-ups on file.  Ann Held, DO

## 2018-11-27 ENCOUNTER — Other Ambulatory Visit: Payer: Self-pay | Admitting: Family Medicine

## 2018-11-27 LAB — LIPID PANEL
CHOLESTEROL: 175 mg/dL (ref 0–200)
HDL: 33 mg/dL — ABNORMAL LOW (ref >39.00)
LDL Cholesterol: 108 mg/dL — ABNORMAL HIGH (ref 0–99)
NonHDL: 141.69
Total CHOL/HDL Ratio: 5
Triglycerides: 168 mg/dL — ABNORMAL HIGH (ref 0.0–149.0)
VLDL: 33.6 mg/dL (ref 0.0–40.0)

## 2018-11-27 LAB — MICROALBUMIN / CREATININE URINE RATIO
Creatinine,U: 87.2 mg/dL
Microalb Creat Ratio: 30.1 mg/g — ABNORMAL HIGH (ref 0.0–30.0)
Microalb, Ur: 26.3 mg/dL — ABNORMAL HIGH (ref 0.0–1.9)

## 2018-11-27 LAB — HEMOGLOBIN A1C: Hgb A1c MFr Bld: 10.3 % — ABNORMAL HIGH (ref 4.6–6.5)

## 2018-11-27 LAB — SEDIMENTATION RATE: Sed Rate: 32 mm/hr — ABNORMAL HIGH (ref 0–30)

## 2018-11-27 NOTE — Assessment & Plan Note (Signed)
Well controlled, no changes to meds. Encouraged heart healthy diet such as the DASH diet and exercise as tolerated.  °

## 2018-11-27 NOTE — Assessment & Plan Note (Signed)
Pain worse Check labs-- may need rheum again Inc # opiod for now and new referral in for pain management

## 2018-11-27 NOTE — Assessment & Plan Note (Signed)
Per endo °

## 2018-11-27 NOTE — Assessment & Plan Note (Signed)
Per [facility]

## 2018-11-27 NOTE — Assessment & Plan Note (Signed)
Per nephrology 

## 2018-11-27 NOTE — Assessment & Plan Note (Signed)
Tolerating statin, encouraged heart healthy diet, avoid trans fats, minimize simple carbs and saturated fats. Increase exercise as tolerated 

## 2018-11-28 DIAGNOSIS — E11621 Type 2 diabetes mellitus with foot ulcer: Secondary | ICD-10-CM | POA: Diagnosis not present

## 2018-11-29 ENCOUNTER — Other Ambulatory Visit: Payer: Self-pay | Admitting: Family Medicine

## 2018-11-29 DIAGNOSIS — E1165 Type 2 diabetes mellitus with hyperglycemia: Secondary | ICD-10-CM

## 2018-11-29 DIAGNOSIS — R2242 Localized swelling, mass and lump, left lower limb: Secondary | ICD-10-CM

## 2018-11-29 DIAGNOSIS — Z794 Long term (current) use of insulin: Principal | ICD-10-CM

## 2018-11-29 LAB — ANA: ANA: NEGATIVE

## 2018-11-29 LAB — RHEUMATOID FACTOR

## 2018-12-03 ENCOUNTER — Ambulatory Visit (HOSPITAL_BASED_OUTPATIENT_CLINIC_OR_DEPARTMENT_OTHER): Payer: PRIVATE HEALTH INSURANCE

## 2018-12-03 ENCOUNTER — Ambulatory Visit (HOSPITAL_BASED_OUTPATIENT_CLINIC_OR_DEPARTMENT_OTHER)
Admission: RE | Admit: 2018-12-03 | Discharge: 2018-12-03 | Disposition: A | Discharge status: Home / Self Care | Payer: PRIVATE HEALTH INSURANCE | Source: Ambulatory Visit | Attending: Family Medicine | Admitting: Family Medicine

## 2018-12-03 ENCOUNTER — Telehealth: Payer: Self-pay | Admitting: *Deleted

## 2018-12-03 DIAGNOSIS — R0789 Other chest pain: Secondary | ICD-10-CM | POA: Insufficient documentation

## 2018-12-03 NOTE — Telephone Encounter (Signed)
Copied from [place] [id number]. Topic: General - Other >> Dec 03, 2018  9:17 AM Judyann Munson wrote: Reason for CRM: 1 call Medical is calling to request a call back with confirmation of the patients MRI, they are needing to know if this is with or without contrast. Best contact number is [phone]. Please advise

## 2018-12-03 NOTE — Telephone Encounter (Signed)
Information given and order faxed over to [place] at [phone]

## 2018-12-05 ENCOUNTER — Encounter: Payer: Self-pay | Admitting: *Deleted

## 2018-12-05 ENCOUNTER — Other Ambulatory Visit: Payer: Self-pay | Admitting: *Deleted

## 2018-12-05 MED ORDER — PREGABALIN 150 MG PO CAPS: ORAL_CAPSULE | ORAL | 0 refills | Status: DC

## 2018-12-05 NOTE — Telephone Encounter (Signed)
Patient request a refill for lyrica  Last filled per database: 10/10/18 Last written: 10/10/18 Last ov: 11/26/18 Next ov: none Contract: none UDS: none

## 2018-12-05 NOTE — Telephone Encounter (Signed)
Done

## 2018-12-06 ENCOUNTER — Encounter (HOSPITAL_BASED_OUTPATIENT_CLINIC_OR_DEPARTMENT_OTHER): Payer: PRIVATE HEALTH INSURANCE | Attending: Internal Medicine

## 2018-12-06 DIAGNOSIS — I2510 Atherosclerotic heart disease of native coronary artery without angina pectoris: Secondary | ICD-10-CM | POA: Insufficient documentation

## 2018-12-06 DIAGNOSIS — L97422 Non-pressure chronic ulcer of left heel and midfoot with fat layer exposed: Secondary | ICD-10-CM | POA: Diagnosis not present

## 2018-12-06 DIAGNOSIS — I252 Old myocardial infarction: Secondary | ICD-10-CM | POA: Insufficient documentation

## 2018-12-06 DIAGNOSIS — I10 Essential (primary) hypertension: Secondary | ICD-10-CM | POA: Diagnosis not present

## 2018-12-06 DIAGNOSIS — E11621 Type 2 diabetes mellitus with foot ulcer: Secondary | ICD-10-CM | POA: Insufficient documentation

## 2018-12-06 DIAGNOSIS — L97522 Non-pressure chronic ulcer of other part of left foot with fat layer exposed: Secondary | ICD-10-CM | POA: Diagnosis not present

## 2018-12-06 DIAGNOSIS — E1140 Type 2 diabetes mellitus with diabetic neuropathy, unspecified: Secondary | ICD-10-CM | POA: Insufficient documentation

## 2018-12-07 ENCOUNTER — Ambulatory Visit (HOSPITAL_COMMUNITY)
Admission: RE | Admit: 2018-12-07 | Discharge: 2018-12-07 | Disposition: A | Discharge status: Home / Self Care | Payer: PRIVATE HEALTH INSURANCE | Source: Ambulatory Visit | Attending: Internal Medicine | Admitting: Internal Medicine

## 2018-12-07 ENCOUNTER — Other Ambulatory Visit (HOSPITAL_COMMUNITY): Payer: Self-pay | Admitting: Internal Medicine

## 2018-12-07 DIAGNOSIS — L97423 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: Secondary | ICD-10-CM

## 2018-12-09 LAB — POCT I-STAT CREATININE: Creatinine, Ser: 2.3 mg/dL — ABNORMAL HIGH (ref 0.44–1.00)

## 2018-12-10 ENCOUNTER — Ambulatory Visit (HOSPITAL_BASED_OUTPATIENT_CLINIC_OR_DEPARTMENT_OTHER)
Admission: RE | Admit: 2018-12-10 | Discharge: 2018-12-10 | Disposition: A | Discharge status: Home / Self Care | Payer: PRIVATE HEALTH INSURANCE | Source: Ambulatory Visit | Attending: Family Medicine | Admitting: Family Medicine

## 2018-12-10 DIAGNOSIS — Z0000 Encounter for general adult medical examination without abnormal findings: Secondary | ICD-10-CM | POA: Diagnosis present

## 2018-12-13 DIAGNOSIS — E11621 Type 2 diabetes mellitus with foot ulcer: Secondary | ICD-10-CM | POA: Diagnosis not present

## 2018-12-16 ENCOUNTER — Other Ambulatory Visit: Payer: Self-pay | Admitting: *Deleted

## 2018-12-16 DIAGNOSIS — R0789 Other chest pain: Secondary | ICD-10-CM

## 2018-12-19 ENCOUNTER — Encounter: Admit: 2018-12-19 | Discharge: 2018-12-19 | Payer: MEDICARE

## 2018-12-19 MED ORDER — FAMOTIDINE 20 MG PO TAB
ORAL_TABLET | Freq: Two times a day (BID) | ORAL | 2 refills | 90.00000 days | Status: AC
Start: 2018-12-19 — End: 2019-06-09

## 2018-12-19 MED ORDER — CLONIDINE HCL 0.1 MG PO TAB
ORAL_TABLET | Freq: Three times a day (TID) | 2 refills | Status: AC
Start: 2018-12-19 — End: 2019-06-09

## 2018-12-20 DIAGNOSIS — E11621 Type 2 diabetes mellitus with foot ulcer: Secondary | ICD-10-CM | POA: Diagnosis not present

## 2018-12-25 DIAGNOSIS — E11621 Type 2 diabetes mellitus with foot ulcer: Secondary | ICD-10-CM | POA: Diagnosis not present

## 2018-12-31 ENCOUNTER — Other Ambulatory Visit: Payer: Self-pay | Admitting: Family Medicine

## 2018-12-31 ENCOUNTER — Encounter: Payer: PRIVATE HEALTH INSURANCE | Admitting: Cardiothoracic Surgery

## 2019-01-01 ENCOUNTER — Encounter: Payer: Self-pay | Admitting: Physical Medicine & Rehabilitation

## 2019-01-02 ENCOUNTER — Other Ambulatory Visit: Payer: Self-pay | Admitting: Family Medicine

## 2019-01-02 DIAGNOSIS — I10 Essential (primary) hypertension: Secondary | ICD-10-CM

## 2019-01-03 ENCOUNTER — Encounter (HOSPITAL_BASED_OUTPATIENT_CLINIC_OR_DEPARTMENT_OTHER): Payer: PRIVATE HEALTH INSURANCE | Attending: Internal Medicine

## 2019-01-03 DIAGNOSIS — E1136 Type 2 diabetes mellitus with diabetic cataract: Secondary | ICD-10-CM | POA: Diagnosis not present

## 2019-01-03 DIAGNOSIS — N183 Chronic kidney disease, stage 3 (moderate): Secondary | ICD-10-CM | POA: Insufficient documentation

## 2019-01-03 DIAGNOSIS — L97422 Non-pressure chronic ulcer of left heel and midfoot with fat layer exposed: Secondary | ICD-10-CM | POA: Insufficient documentation

## 2019-01-03 DIAGNOSIS — Z6832 Body mass index (BMI) 32.0-32.9, adult: Secondary | ICD-10-CM | POA: Insufficient documentation

## 2019-01-03 DIAGNOSIS — I252 Old myocardial infarction: Secondary | ICD-10-CM | POA: Diagnosis not present

## 2019-01-03 DIAGNOSIS — I2510 Atherosclerotic heart disease of native coronary artery without angina pectoris: Secondary | ICD-10-CM | POA: Diagnosis not present

## 2019-01-03 DIAGNOSIS — E11621 Type 2 diabetes mellitus with foot ulcer: Secondary | ICD-10-CM | POA: Diagnosis not present

## 2019-01-03 DIAGNOSIS — E1140 Type 2 diabetes mellitus with diabetic neuropathy, unspecified: Secondary | ICD-10-CM | POA: Insufficient documentation

## 2019-01-03 DIAGNOSIS — I129 Hypertensive chronic kidney disease with stage 1 through stage 4 chronic kidney disease, or unspecified chronic kidney disease: Secondary | ICD-10-CM | POA: Diagnosis not present

## 2019-01-03 DIAGNOSIS — E1122 Type 2 diabetes mellitus with diabetic chronic kidney disease: Secondary | ICD-10-CM | POA: Diagnosis not present

## 2019-01-07 ENCOUNTER — Telehealth: Payer: Self-pay | Admitting: *Deleted

## 2019-01-07 NOTE — Telephone Encounter (Signed)
Received request for Imaging results from [facility], Attn: Nani Gasser, RE: CT Thorax w/o Dye. Results had abnormalities, MR Abdomen has been ordered; report faxed/SLS 03/10

## 2019-01-08 DIAGNOSIS — E11621 Type 2 diabetes mellitus with foot ulcer: Secondary | ICD-10-CM | POA: Diagnosis not present

## 2019-01-10 ENCOUNTER — Encounter: Admit: 2019-01-10 | Discharge: 2019-01-10 | Payer: MEDICARE

## 2019-01-10 MED ORDER — BLOOD-GLUCOSE METER MISC KIT
PACK | 0 refills | Status: AC
Start: 2019-01-10 — End: 2019-05-20

## 2019-01-10 MED ORDER — BLOOD SUGAR DIAGNOSTIC MISC STRP
1 | ORAL_STRIP | Freq: Two times a day (BID) | 1 refills | 30.00000 days | Status: AC
Start: 2019-01-10 — End: 2019-05-20

## 2019-01-10 MED ORDER — CYCLOBENZAPRINE 5 MG PO TAB
5 mg | ORAL_TABLET | Freq: Every evening | ORAL | 1 refills | 30.00000 days | Status: AC
Start: 2019-01-10 — End: 2019-01-14

## 2019-01-10 MED ORDER — FLUTICASONE PROPIONATE 50 MCG/ACTUATION NA SPSN
NASAL | 0 refills | 60.00000 days | Status: AC
Start: 2019-01-10 — End: 2019-01-14

## 2019-01-13 ENCOUNTER — Encounter: Admit: 2019-01-13 | Discharge: 2019-01-13 | Payer: MEDICARE

## 2019-01-13 MED ORDER — LANCETS MISC MISC
1 | Freq: Every day | 3 refills | Status: AC
Start: 2019-01-13 — End: 2019-01-14

## 2019-01-13 MED ORDER — BLOOD GLUCOSE CONTROL, NORMAL MISC SOLN
ORAL | 3 refills | 50.00000 days | Status: AC
Start: 2019-01-13 — End: ?

## 2019-01-13 MED ORDER — ALCOHOL SWABS TP PADM
3 refills | 30.00000 days | Status: AC
Start: 2019-01-13 — End: 2019-11-13

## 2019-01-14 ENCOUNTER — Encounter: Admit: 2019-01-14 | Discharge: 2019-01-14 | Payer: MEDICARE

## 2019-01-14 MED ORDER — LANCETS MISC MISC
1 | Freq: Every day | 3 refills | Status: AC
Start: 2019-01-14 — End: 2019-05-20

## 2019-01-14 MED ORDER — CYCLOBENZAPRINE 5 MG PO TAB
5 mg | ORAL_TABLET | Freq: Every evening | ORAL | 1 refills | 30.00000 days | Status: AC
Start: 2019-01-14 — End: 2019-02-26

## 2019-01-14 MED ORDER — FLUTICASONE PROPIONATE 50 MCG/ACTUATION NA SPSN
NASAL | 0 refills | 60.00000 days | Status: AC
Start: 2019-01-14 — End: 2020-01-22

## 2019-01-17 ENCOUNTER — Other Ambulatory Visit: Payer: Self-pay | Admitting: Nephrology

## 2019-01-17 DIAGNOSIS — N183 Chronic kidney disease, stage 3 unspecified: Secondary | ICD-10-CM

## 2019-01-17 DIAGNOSIS — E11621 Type 2 diabetes mellitus with foot ulcer: Secondary | ICD-10-CM | POA: Diagnosis not present

## 2019-01-21 ENCOUNTER — Other Ambulatory Visit: Payer: Self-pay | Admitting: Family Medicine

## 2019-01-21 ENCOUNTER — Telehealth: Payer: Self-pay | Admitting: Family Medicine

## 2019-01-21 MED ORDER — PREGABALIN 150 MG PO CAPS: ORAL_CAPSULE | ORAL | 2 refills | Status: DC

## 2019-01-21 NOTE — Telephone Encounter (Signed)
Refilled

## 2019-01-21 NOTE — Telephone Encounter (Signed)
Pt called and requesting a refill on her lyrica to be sent in to [facility].

## 2019-01-21 NOTE — Telephone Encounter (Signed)
Pt is requesting refill on Lyrica.   Last OV: 11/26/2018 Last Fill: 12/05/2018 #90 and 0RF UDS: 05/06/2018 Low risk

## 2019-01-24 DIAGNOSIS — E11621 Type 2 diabetes mellitus with foot ulcer: Secondary | ICD-10-CM | POA: Diagnosis not present

## 2019-01-25 ENCOUNTER — Other Ambulatory Visit: Payer: Self-pay | Admitting: Family Medicine

## 2019-01-25 DIAGNOSIS — M797 Fibromyalgia: Secondary | ICD-10-CM

## 2019-01-25 DIAGNOSIS — F419 Anxiety disorder, unspecified: Secondary | ICD-10-CM

## 2019-01-27 ENCOUNTER — Other Ambulatory Visit: Payer: Self-pay | Admitting: *Deleted

## 2019-01-27 NOTE — Telephone Encounter (Signed)
error 

## 2019-01-29 ENCOUNTER — Encounter: Payer: PRIVATE HEALTH INSURANCE | Admitting: Cardiothoracic Surgery

## 2019-01-31 ENCOUNTER — Other Ambulatory Visit: Payer: Self-pay

## 2019-01-31 ENCOUNTER — Encounter (HOSPITAL_BASED_OUTPATIENT_CLINIC_OR_DEPARTMENT_OTHER): Payer: PRIVATE HEALTH INSURANCE | Attending: Internal Medicine

## 2019-01-31 DIAGNOSIS — I252 Old myocardial infarction: Secondary | ICD-10-CM | POA: Diagnosis not present

## 2019-01-31 DIAGNOSIS — L97422 Non-pressure chronic ulcer of left heel and midfoot with fat layer exposed: Secondary | ICD-10-CM | POA: Insufficient documentation

## 2019-01-31 DIAGNOSIS — I10 Essential (primary) hypertension: Secondary | ICD-10-CM | POA: Insufficient documentation

## 2019-01-31 DIAGNOSIS — E1140 Type 2 diabetes mellitus with diabetic neuropathy, unspecified: Secondary | ICD-10-CM | POA: Diagnosis not present

## 2019-01-31 DIAGNOSIS — E11621 Type 2 diabetes mellitus with foot ulcer: Secondary | ICD-10-CM | POA: Diagnosis present

## 2019-01-31 DIAGNOSIS — I2510 Atherosclerotic heart disease of native coronary artery without angina pectoris: Secondary | ICD-10-CM | POA: Diagnosis not present

## 2019-02-04 ENCOUNTER — Encounter: Admit: 2019-02-04 | Discharge: 2019-02-04 | Payer: MEDICARE

## 2019-02-06 ENCOUNTER — Encounter
Payer: PRIVATE HEALTH INSURANCE | Attending: Physical Medicine & Rehabilitation | Admitting: Physical Medicine & Rehabilitation

## 2019-02-07 DIAGNOSIS — E11621 Type 2 diabetes mellitus with foot ulcer: Secondary | ICD-10-CM | POA: Diagnosis not present

## 2019-02-11 ENCOUNTER — Encounter: Admit: 2019-02-11 | Discharge: 2019-02-11 | Payer: MEDICARE

## 2019-02-11 DIAGNOSIS — R1010 Upper abdominal pain, unspecified: Principal | ICD-10-CM

## 2019-02-11 NOTE — Telephone Encounter
Yes, that would be a good idea.  I put in an order for the H. Pylori breath test - please print off and fax to [facility].  She can do this at [facility] at [address] (just behind [facility]):    [facility]   [address]  [place], [place]  [address]    6:30 am to 4:30 pm Mon thru Friday  7 am to 12 noon on Saturdays

## 2019-02-12 ENCOUNTER — Encounter: Admit: 2019-02-12 | Discharge: 2019-02-12 | Payer: MEDICARE

## 2019-02-14 ENCOUNTER — Encounter: Admit: 2019-02-14 | Discharge: 2019-02-14 | Payer: MEDICARE

## 2019-02-14 DIAGNOSIS — E11621 Type 2 diabetes mellitus with foot ulcer: Secondary | ICD-10-CM | POA: Diagnosis not present

## 2019-02-14 MED ORDER — HYOSCYAMINE SULFATE 0.125 MG PO TAB
125 ug | ORAL_TABLET | ORAL | 3 refills | Status: DC | PRN
Start: 2019-02-14 — End: 2019-02-20

## 2019-02-14 NOTE — Telephone Encounter
Rhonda Fletcher is feeling somewhat better but says she still has a lot of pain before having a bowel movement, and describes it like having a baby again

## 2019-02-14 NOTE — Telephone Encounter
It sounds like she's having bowel cramping.  We could try a medication for bowel cramping:  Levsin.  I put in a Rx, please send to her preferred pharmacy.

## 2019-02-18 ENCOUNTER — Encounter: Admit: 2019-02-18 | Discharge: 2019-02-18 | Payer: MEDICARE

## 2019-02-19 ENCOUNTER — Encounter: Admit: 2019-02-19 | Discharge: 2019-02-19 | Payer: MEDICARE

## 2019-02-19 MED ORDER — METFORMIN 1,000 MG PO TAB
ORAL_TABLET | Freq: Two times a day (BID) | 1 refills | Status: DC
Start: 2019-02-19 — End: 2019-06-30

## 2019-02-20 ENCOUNTER — Encounter: Admit: 2019-02-20 | Discharge: 2019-02-20 | Payer: MEDICARE

## 2019-02-20 DIAGNOSIS — K589 Irritable bowel syndrome without diarrhea: ICD-10-CM

## 2019-02-20 DIAGNOSIS — G9050 Complex regional pain syndrome I, unspecified: ICD-10-CM

## 2019-02-20 DIAGNOSIS — E538 Deficiency of other specified B group vitamins: Secondary | ICD-10-CM

## 2019-02-20 DIAGNOSIS — J309 Allergic rhinitis, unspecified: ICD-10-CM

## 2019-02-20 DIAGNOSIS — E785 Hyperlipidemia, unspecified: ICD-10-CM

## 2019-02-20 DIAGNOSIS — J45909 Unspecified asthma, uncomplicated: ICD-10-CM

## 2019-02-20 DIAGNOSIS — E559 Vitamin D deficiency, unspecified: ICD-10-CM

## 2019-02-20 DIAGNOSIS — I10 Essential (primary) hypertension: ICD-10-CM

## 2019-02-20 DIAGNOSIS — K2970 Gastritis, unspecified, without bleeding: Principal | ICD-10-CM

## 2019-02-20 DIAGNOSIS — E119 Type 2 diabetes mellitus without complications: ICD-10-CM

## 2019-02-20 DIAGNOSIS — G5600 Carpal tunnel syndrome, unspecified upper limb: ICD-10-CM

## 2019-02-20 DIAGNOSIS — Z0000 Encounter for general adult medical examination without abnormal findings: ICD-10-CM

## 2019-02-20 DIAGNOSIS — K5900 Constipation, unspecified: ICD-10-CM

## 2019-02-20 DIAGNOSIS — D649 Anemia, unspecified: ICD-10-CM

## 2019-02-20 DIAGNOSIS — M79642 Pain in left hand: ICD-10-CM

## 2019-02-20 LAB — LIPID PROFILE: Lab: 267 mg/dL — ABNORMAL HIGH (ref <200)

## 2019-02-20 LAB — CBC AND DIFF
Lab: 1 % — ABNORMAL LOW (ref >60)
Lab: 2.3 10*3/uL — ABNORMAL HIGH (ref 1.0–4.8)
Lab: 22 % — ABNORMAL LOW (ref 24–44)
Lab: 330 10*3/uL (ref 150–400)
Lab: 73 FL — ABNORMAL LOW (ref 80–100)
Lab: 9.2 FL (ref 7–11)

## 2019-02-20 LAB — COMPREHENSIVE METABOLIC PANEL: Lab: 142 MMOL/L (ref <150)

## 2019-02-20 LAB — IRON + BINDING CAPACITY + %SAT+ FERRITIN: Lab: 40 ug/dL — ABNORMAL LOW (ref 50–160)

## 2019-02-20 LAB — THYROID STIMULATING HORMONE-TSH: Lab: 0.9 uU/mL — ABNORMAL HIGH (ref <30)

## 2019-02-20 LAB — MICROALB/CR RATIO-URINE RANDOM: Lab: 65 ug/mL — ABNORMAL HIGH (ref <19)

## 2019-02-20 MED ORDER — HYOSCYAMINE SULFATE 0.125 MG PO TAB
125 ug | ORAL_TABLET | Freq: Two times a day (BID) | ORAL | 3 refills | Status: DC
Start: 2019-02-20 — End: 2019-05-23

## 2019-02-20 NOTE — Progress Notes
Health Risk Assessment Questionnaire  Current Care  List of Providers you have seen in the last two years: Dr. Steward Ros at [facility] on April 4th  Are you receiving home health?: No  During the past 4 weeks, how would you rate your health in general?: Good    Outside Care  Since your last PCP visit, have you received care outside of [facility]?: (!) Yes  What type of care did you receive outside of [facility]? (select all that apply): (!) (P) Emergency Room Visit  What is the Facility where you received care and the provider's name?: (P) [facility], Dr. Steward Ros in the ER    Physical Activity  Do you exercise or are you physically active?: Yes  How many days a week do you usually exercise or are physically active?: (P) 3  On days when you exercised or were physically active, how many minutes was the activity?: (P) 40  During the past four weeks, what was the hardest physical activity you could do for at least two minutes?: (P) Moderate    Diet  In the past month, were you worried whether your food would run out before you or your family had money to buy more?: No  In the past 7 days, how many times did you eat fast food or junk food or pizza?: 0  In the past 7 days, how many servings of fruits or vegetables did you eat each day?: (!) 2-3  In the past 7 days, how many sodas and sugar sweetened drinks (regular, not diet) did you drink each day?: 0    Smoke/Tobacco Use  Are you currently a smoker?: No    Alcohol Use  Do you drink alcohol?: Yes  Are you Female or Female?: (P) Female    Female: In the last three months, have you had >3 alcoholic beverages in any one day or >7 in any one week?: (!) (P) Yes    Depression Screen  Little interest or pleasure in doing things: Not at All  Feeling down, depressed or hopeless: (!) Several Days    Pain  How would you rate your pain today?: (!) Moderate pain    Ambulation Past, family, social hx reviewed with pt and documented.   A Health Risk assessment was performed by the patient today, reviewed with the patient and the results are above.    Other health care providers: see above  Special diet:   No Added Salt yes    Low fat yes    Carbohydrate counting yes  She does not have cognitive dysfunction.   PHQ-2 depression screening - 0  She does not report hearing loss.   is generally independent in ADLs   Home safety screen assessed: (smoke alarms, railings on stairs, grab bar in bathroom) Yes  She issteady on her feet and able to perform the get up and go test promptly.   We did discuss end of life issues in the past.She does not have a living will and will provide a copy of the document to have scanned into the medical record.  A lipid panel has been done in the past 5 years.  For colon cancer screening the patient has had a colonoscopy within the past 10 years.   Fasting blood sugar ordered.   Glaucoma screening recommended for at risk individuals.     For female patients only:   Not at risk high  risk cervical cancer.  No history of cervical dysplasia within the past 20  years and last 3 paps reportedly normal/ testing deferred.    Mammogram is notcurrent and recommended annually.   She has been screened for osteoporosis with a bone density   She denies trouble with urinary incontinence.    Records reflect the pneumococcal vaccine and tdap arecurrent.   Personal prevention reviewed with patient and a copy was given via the After Visit Summary.    While the patient was here today, due to his/her multiple chronic conditions it would be in the best interest of the patient for me to monitor, assess and evaluate those as well.     Last seen in Fall 2019 for f/u diabetes and multiple other medical issues.    Called a week ago with upper abd pain and she wanted to get retested for H pylori - negative.  Went to ER - w/u negative. Given Sucralfate, told to stay on Protonix. Sxs sounded like bowel cramping - sent Rx for Levsin for her to try.    She reports that Levsin helped.  Would like a bigger rx - BID scheduled, to her [facility] pharmacy.    Her abd pain started in Sept 2019 when she started having trouble with her daughters, so the pain is probably due flare of her irritable bowel, discussed.  She is due for a colonoscopy this year.    No constipation, but pain is definitely correlated with moving her bowels.    Still stress with daughters  Kayren Eaves to family therapy with daughters, but it doesn't seem to be helping.    Was drinking alcohol, but quit.    She has friends and prayer partners for support, and isn't too isolated despite the Covid-19 pandemic.    Was exercising but since abd pain flared not as good - she plans to get back on this.    BP is a little high today - she notes that she just found out last night via Facebook that her grandson moved in with a girl, and this has her upset and stressed.    Has diabetes, treated with Metformin.    A1C was 7.5% in 07/2018.     Has chronic iron def anemia.  In April 2019 her anemia and iron levels were worse, and I recommended an EGD - negative.   In Oct 2019 iron levels still low - recommended IV iron infusions.    Has HTN, treated with Amlodipine and Clonidine.  Has not tolerated ACEI or ARB.     Has Vit D def; taking supplement.     Has microalbuminuria but has not been able to tolerate ACEI or ARB.     Has reflex symp dystrophy of arms/hands x years, with chronic pain.  Has not tolerated Savella, Gabapentin, or Lyrica in the past.     Has GERD, treated with Protonix and Pepcid.     Has hyperlipidemia, diet tx'd.  Has not tolerated statin medications.    Has IBS, stable.       Has chronic dermatitis.  Betamethasone diproprionate was too expensive, but she states that betamethason valerate is reasonable in cost.    Has had B12 def; taking supplement.    Health maint items reviewed and discussed:  Tdap  Shingrix  Foot exam  Mammogram mouth daily. 3 tablet 0   ??? fluticasone propionate (FLONASE) 50 mcg/actuation nasal spray, suspension Shake bottle gently before using.  Indications: Use 2 SPRAYS IN EACH NOSTRIL AS  DIRECTED DAILY 48 g 0   ??? hyoscyamine sulfate (LEVSIN) 0.125 mg tablet Take one tablet by mouth twice daily. For chronic pain from irritable bowel 180 tablet 3   ??? [DISCONTINUED] hyoscyamine sulfate (LEVSIN) 0.125 mg tablet Take one tablet by mouth every 4 hours as needed for Cramps. 30 tablet 3   ??? lancets (ACCU-CHEK SOFTCLIX LANCETS) MISC Use one each as directed daily. Diag:E11.29 100 each 3   ??? metFORMIN (GLUCOPHAGE) 1,000 mg tablet TAKE 1 TABLET TWICE DAILY WITH MEALS 180 tablet 1   ??? pantoprazole DR (PROTONIX) 40 mg tablet TAKE 1 TABLET EVERY DAY 90 tablet 1   ??? traZODone (DESYREL) 50 mg tablet Take one tablet by mouth at bedtime as needed for Sleep. 90 tablet 3     No facility-administered encounter medications on file as of 02/20/2019.      ROS:  Constitutional: energy level low.  Weight stable. No fever.  No chills.  Eyes:  No vision problems, no drainage.  Ears/Nose/Mouth/Throat:  No hearing difficulties.  No congestion.  No oral ulcers.  No sore throat.  No difficulty swallowing.  CV: No chest pain or palpitations.  Resp: No shortness of breath or cough.  GI:  + abdominal pain - see above.  Bowels normal, with no hematochezia or melena.  GU: no dysuria.  No urinary frequency.  No significant incontinence.  MS:  No significant changes.  SKIN:  Has noted no new moles or changes in moles.  BREASTS:  Completes breast self-exam regularly and has not noticed any changes.  NEURO:  No tingling, numbness, weakness, or other complaints.  PSYCH: Mood good, denies depression or anxiety.  Sleeps well.  ENDO: no vaginal bleeding.  No vaginal discharge.    Objective  Vitals:    02/20/19 0827 02/20/19 0936   BP: (!) 145/77 (!) 152/77   BP Source: Arm, Right Upper Arm, Right Upper   Patient Position: Sitting Sitting   Pulse: 90 Temp: 36.8 ???C (98.3 ???F)    TempSrc: Oral    SpO2: 100%    Weight: 71.3 kg (157 lb 3.2 oz)    Height: 166 cm (65.35)      Body mass index is 25.88 kg/m???.    Constitutional: Alert, well nourished, in no distress.    Eyes:  PERRLA, EOMI.  Conjunctiva are non-icteric and are no injected.  ENT: External ear canals are negative.  TM's are clear bilat, with no erythema.  Oropharynx is clear, with no exudates or ulcers seen.   Sinuses are nontender to palpation bilat.    ENDO:  No thyroid enlargement or nodules.  CV: Heart exam shows regular rhythm, no murmur, no S3, no S4.  Carotid pulses are 2/4 and equal bilat, with no carotid bruits.  Good DP Pulses bilat.  RESP: Lungs are clear to auscultation bilat, with no rales, rhonchi, or wheezing.  GI: Normal bowel sounds.  Abdomen is soft, nontender.  No hepatosplenomegaly, no mass.   MS: no joint swelling or erythema.  SKIN:  No rash, no abnormal appearing moles.  NEURO:  CN's 2-12 intact; strength is equal bilat.  PSYCH:  Good eye contact, normal affect.  LYMPH:  No significant LAD in neck, axillary or groin areas.    Diabetic Foot Exam       Bilateral vascular, sensation, integument are normal:  Yes    ASSESSMENT/PLAN:  Daizee was seen today for physical.    Diagnoses and all orders for this visit:    Medicare annual wellness  visit, subsequent    Annual physical exam    Type 2 diabetes mellitus with microalbuminuria, without long-term current use of insulin (HCC)  -     HEMOGLOBIN A1C; Future  -     MICROALB/CR RATIO-URINE RANDOM; Future    Vitamin D deficiency  -     25-OH VITAMIN D (D2 + D3); Future    B12 deficiency  -     VITAMIN B12; Future    Iron deficiency anemia, unspecified iron deficiency anemia type  -     IRON + BINDING CAPACITY + %SAT+ FERRITIN; Future    Hyperlipidemia, unspecified hyperlipidemia type  -     CBC AND DIFF; Future  -     COMPREHENSIVE METABOLIC PANEL; Future  -     LIPID PROFILE; Future    Long term use of drug    Essential hypertension

## 2019-02-21 ENCOUNTER — Ambulatory Visit: Admit: 2019-02-20 | Discharge: 2019-02-21 | Payer: MEDICARE

## 2019-02-21 DIAGNOSIS — E11621 Type 2 diabetes mellitus with foot ulcer: Secondary | ICD-10-CM | POA: Diagnosis not present

## 2019-02-21 DIAGNOSIS — E559 Vitamin D deficiency, unspecified: Secondary | ICD-10-CM

## 2019-02-21 DIAGNOSIS — K580 Irritable bowel syndrome with diarrhea: ICD-10-CM

## 2019-02-21 DIAGNOSIS — R5383 Other fatigue: Secondary | ICD-10-CM

## 2019-02-21 DIAGNOSIS — R809 Proteinuria, unspecified: ICD-10-CM

## 2019-02-21 DIAGNOSIS — K219 Gastro-esophageal reflux disease without esophagitis: ICD-10-CM

## 2019-02-21 DIAGNOSIS — G9050 Complex regional pain syndrome I, unspecified: ICD-10-CM

## 2019-02-21 DIAGNOSIS — Z789 Other specified health status: ICD-10-CM

## 2019-02-21 DIAGNOSIS — Z79899 Other long term (current) drug therapy: ICD-10-CM

## 2019-02-21 DIAGNOSIS — I10 Essential (primary) hypertension: ICD-10-CM

## 2019-02-21 DIAGNOSIS — E1129 Type 2 diabetes mellitus with other diabetic kidney complication: Principal | ICD-10-CM

## 2019-02-21 DIAGNOSIS — E785 Hyperlipidemia, unspecified: ICD-10-CM

## 2019-02-21 DIAGNOSIS — D509 Iron deficiency anemia, unspecified: ICD-10-CM

## 2019-02-21 LAB — 25-OH VITAMIN D (D2 + D3): Lab: 38 ng/mL — ABNORMAL HIGH (ref 30–80)

## 2019-02-24 ENCOUNTER — Encounter: Admit: 2019-02-24 | Discharge: 2019-02-24 | Payer: MEDICARE

## 2019-02-25 ENCOUNTER — Inpatient Hospital Stay: Admission: RE | Admit: 2019-02-25 | Payer: PRIVATE HEALTH INSURANCE | Source: Ambulatory Visit

## 2019-02-25 ENCOUNTER — Encounter: Admit: 2019-02-25 | Discharge: 2019-02-25 | Payer: MEDICARE

## 2019-02-26 MED ORDER — CYCLOBENZAPRINE 5 MG PO TAB
ORAL_TABLET | Freq: Every evening | ORAL | 0 refills | 30.00000 days | Status: DC
Start: 2019-02-26 — End: 2019-04-09

## 2019-02-27 ENCOUNTER — Other Ambulatory Visit: Payer: Self-pay | Admitting: Family Medicine

## 2019-02-27 DIAGNOSIS — M79672 Pain in left foot: Secondary | ICD-10-CM

## 2019-02-27 DIAGNOSIS — M797 Fibromyalgia: Secondary | ICD-10-CM

## 2019-02-27 NOTE — Telephone Encounter (Signed)
Copied from [place] [phone]. Topic: Quick Communication - Rx Refill/Question >> Feb 27, 2019  2:46 PM Scherrie Gerlach wrote: Medication: HYDROcodone-acetaminophen (NORCO/VICODIN) 5-325 MG tablet 120 tab [id number] - [place], [place] - [address] [place] [address] [phone] (Phone) [phone] (Fax

## 2019-02-28 ENCOUNTER — Encounter (HOSPITAL_BASED_OUTPATIENT_CLINIC_OR_DEPARTMENT_OTHER): Payer: PRIVATE HEALTH INSURANCE | Attending: Internal Medicine

## 2019-02-28 ENCOUNTER — Telehealth: Payer: Self-pay | Admitting: Family Medicine

## 2019-02-28 ENCOUNTER — Telehealth: Payer: Self-pay

## 2019-02-28 ENCOUNTER — Other Ambulatory Visit: Payer: Self-pay | Admitting: Family Medicine

## 2019-02-28 DIAGNOSIS — I2510 Atherosclerotic heart disease of native coronary artery without angina pectoris: Secondary | ICD-10-CM | POA: Insufficient documentation

## 2019-02-28 DIAGNOSIS — E1140 Type 2 diabetes mellitus with diabetic neuropathy, unspecified: Secondary | ICD-10-CM | POA: Insufficient documentation

## 2019-02-28 DIAGNOSIS — E11621 Type 2 diabetes mellitus with foot ulcer: Secondary | ICD-10-CM | POA: Diagnosis not present

## 2019-02-28 DIAGNOSIS — M797 Fibromyalgia: Secondary | ICD-10-CM

## 2019-02-28 DIAGNOSIS — L97522 Non-pressure chronic ulcer of other part of left foot with fat layer exposed: Secondary | ICD-10-CM | POA: Insufficient documentation

## 2019-02-28 DIAGNOSIS — L97422 Non-pressure chronic ulcer of left heel and midfoot with fat layer exposed: Secondary | ICD-10-CM | POA: Insufficient documentation

## 2019-02-28 DIAGNOSIS — I10 Essential (primary) hypertension: Secondary | ICD-10-CM | POA: Insufficient documentation

## 2019-02-28 DIAGNOSIS — M79672 Pain in left foot: Secondary | ICD-10-CM

## 2019-02-28 MED ORDER — HYDROCODONE-ACETAMINOPHEN 5-325 MG PO TABS: 1.0000 | ORAL_TABLET | Freq: Four times a day (QID) | ORAL | 0 refills | Status: DC | PRN

## 2019-02-28 NOTE — Telephone Encounter (Signed)
Copied from [place] [id number]. Topic: Quick Communication - Rx Refill/Question >> Feb 28, 2019  3:25 PM Rainey Pines A wrote: Medication: HYDROcodone-acetaminophen (NORCO/VICODIN) 5-325 MG tablet (Patient stated medication was sent to wrong pharmacy.)  Has the patient contacted their pharmacy? Yes (Agent: If no, request that the patient contact the pharmacy for the refill.) (Agent: If yes, when and what did the pharmacy advise?)Contact PCP  Preferred Pharmacy (with phone number or street name): [id number] - [place], [place] - [address] [phone] (Phone) [phone] (Fax)    Agent: Please be advised that RX refills may take up to 3 business days. We ask that you follow-up with your pharmacy.

## 2019-02-28 NOTE — Telephone Encounter (Signed)
Called patient to set up virtual visit per Dr. Etter Sjogren. Left VM to call back.

## 2019-02-28 NOTE — Telephone Encounter (Signed)
LVM for pt to schedule VOV fu appt.

## 2019-02-28 NOTE — Telephone Encounter (Signed)
Copied from [place] [id number]. Topic: Appointment Scheduling - Prior Auth Required for Appointment >> Feb 28, 2019 12:19 PM Robina Ade, Helene Kelp D wrote: No appointment has been scheduled. Patient is requesting f/u appointment with Dr. Etter Sjogren. Per scheduling protocol, this appointment requires a prior authorization prior to scheduling.  Route to department's PEC pool. Called office but couldn't get through.

## 2019-02-28 NOTE — Telephone Encounter (Signed)
Requesting: [place]: 08/16/2017 UDS:05/06/2018, low risk, next screen 11/06/2018 Last OV: 05/06/2018 Next OV: N/A Last Refill: 11/26/2018, #120-0 RF Database:   Please advise

## 2019-03-03 ENCOUNTER — Other Ambulatory Visit: Payer: Self-pay | Admitting: Family Medicine

## 2019-03-03 ENCOUNTER — Ambulatory Visit (INDEPENDENT_AMBULATORY_CARE_PROVIDER_SITE_OTHER): Payer: PRIVATE HEALTH INSURANCE | Admitting: Family Medicine

## 2019-03-03 ENCOUNTER — Other Ambulatory Visit: Payer: Self-pay

## 2019-03-03 ENCOUNTER — Encounter: Payer: Self-pay | Admitting: Family Medicine

## 2019-03-03 DIAGNOSIS — I10 Essential (primary) hypertension: Secondary | ICD-10-CM | POA: Diagnosis not present

## 2019-03-03 DIAGNOSIS — E1121 Type 2 diabetes mellitus with diabetic nephropathy: Secondary | ICD-10-CM

## 2019-03-03 DIAGNOSIS — Z794 Long term (current) use of insulin: Secondary | ICD-10-CM

## 2019-03-03 DIAGNOSIS — E1169 Type 2 diabetes mellitus with other specified complication: Secondary | ICD-10-CM | POA: Diagnosis not present

## 2019-03-03 DIAGNOSIS — R300 Dysuria: Secondary | ICD-10-CM

## 2019-03-03 DIAGNOSIS — M797 Fibromyalgia: Secondary | ICD-10-CM

## 2019-03-03 DIAGNOSIS — M79672 Pain in left foot: Secondary | ICD-10-CM

## 2019-03-03 DIAGNOSIS — E785 Hyperlipidemia, unspecified: Secondary | ICD-10-CM

## 2019-03-03 MED ORDER — HYDROCODONE-ACETAMINOPHEN 5-325 MG PO TABS: 1.0000 | ORAL_TABLET | Freq: Four times a day (QID) | ORAL | 0 refills | Status: DC | PRN

## 2019-03-03 NOTE — Progress Notes (Signed)
Virtual Visit via Video Note  I connected with [name] [name] on 03/03/19 at  2:00 PM EDT by a video enabled telemedicine application and verified that I am speaking with the correct person using two identifiers.  Location: Patient: home Provider: [email or web address]   I discussed the limitations of evaluation and management by telemedicine and the availability of in person appointments. The patient expressed understanding and agreed to proceed.  History of Present Illness: Pt is home with no new complaintsl     Under the care of [facility] for her wounds on her foot-- healing well -- still has a cast.   HYPERTENSION   Blood pressure range-not checking   Chest pain- no      Dyspnea- no Lightheadedness- no   Edema- no  Other side effects - no   Medication compliance: good Low salt diet- yes    DIABETES    Blood Sugar ranges-114-147  Polyuria- no New Visual problems- no  Hypoglycemic symptoms- no  Other side effects-no Medication compliance - good Last eye exam- last week     HYPERLIPIDEMIA  Medication compliance- good RUQ pain- no  Muscle aches- no Other side effects-no   Review of Systems  Constitutional: Negative for activity change, appetite change and fatigue.  HENT: Negative for hearing loss, congestion, tinnitus and ear discharge.  dentist [id number]: Negative for visual disturbance  Respiratory: Negative for cough, chest tightness and shortness of breath.   Cardiovascular: Negative for chest pain, palpitations and leg swelling.  Gastrointestinal: Negative for abdominal pain, diarrhea, constipation and abdominal distention.  Genitourinary: Negative for urgency, frequency, decreased urine volume and difficulty urinating.  Musculoskeletal: Negative for back pain, arthralgias and foot in a cast  Skin: Negative for color change, pallor and rash.  Neurological: Negative for dizziness, light-headedness, numbness and headaches.  Hematological: Negative for adenopathy. Does not bruise/bleed  easily.  Psychiatric/Behavioral: Negative for suicidal ideas, confusion, sleep disturbance, self-injury, dysphoric mood, decreased concentration and agitation.         Observations/Objective: Afebrile ,  Pt not able to check vitals due to not having equipment \ Pt in NAD Assessment and Plan: 1. Type 2 diabetes mellitus with diabetic nephropathy, with long-term current use of insulin (HCC) hgba1c to be checked, minimize simple carbs. Increase exercise as tolerated. Continue current meds  - Hemoglobin A1c; Future - Comprehensive metabolic panel; Future  2. Essential hypertension---- unable to check today but pt states it was running good in other offices Well controlled, no changes to meds. Encouraged heart healthy diet such as the DASH diet and exercise as tolerated.   - Lipid panel; Future - Hemoglobin A1c; Future - Comprehensive metabolic panel; Future  3. Hyperlipidemia associated with type 2 diabetes mellitus ([place] Encouraged heart healthy diet, increase exercise, avoid trans fats, consider a krill oil cap daily - Lipid panel; Future - Comprehensive metabolic panel; Future  4. Dysuria Check urine when labs are done - POCT Urinalysis Dipstick (Automated); Future   Follow Up Instructions:    I discussed the assessment and treatment plan with the patient. The patient was provided an opportunity to ask questions and all were answered. The patient agreed with the plan and demonstrated an understanding of the instructions.   The patient was advised to call back or seek an in-person evaluation if the symptoms worsen or if the condition fails to improve as anticipated.  I provided 25 minutes of non-face-to-face time during this encounter.   YAnn Held DO

## 2019-03-03 NOTE — Telephone Encounter (Signed)
The prescription was sent to wrong pharmacy.  rx should have been sent to [facility] [address].  The one at [place] has been cancelled

## 2019-03-03 NOTE — Telephone Encounter (Signed)
Need more info

## 2019-03-05 ENCOUNTER — Encounter: Payer: PRIVATE HEALTH INSURANCE | Admitting: Cardiothoracic Surgery

## 2019-03-07 ENCOUNTER — Other Ambulatory Visit: Payer: Self-pay | Admitting: Family Medicine

## 2019-03-07 DIAGNOSIS — E11621 Type 2 diabetes mellitus with foot ulcer: Secondary | ICD-10-CM | POA: Diagnosis not present

## 2019-03-07 DIAGNOSIS — I10 Essential (primary) hypertension: Secondary | ICD-10-CM

## 2019-03-11 ENCOUNTER — Telehealth: Payer: Self-pay | Admitting: *Deleted

## 2019-03-14 ENCOUNTER — Other Ambulatory Visit (HOSPITAL_COMMUNITY)
Admission: RE | Admit: 2019-03-14 | Discharge: 2019-03-14 | Disposition: A | Discharge status: Home / Self Care | Payer: PRIVATE HEALTH INSURANCE | Source: Other Acute Inpatient Hospital | Attending: Internal Medicine | Admitting: Internal Medicine

## 2019-03-14 DIAGNOSIS — L97423 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: Secondary | ICD-10-CM | POA: Insufficient documentation

## 2019-03-14 DIAGNOSIS — E11621 Type 2 diabetes mellitus with foot ulcer: Secondary | ICD-10-CM | POA: Diagnosis not present

## 2019-03-17 ENCOUNTER — Other Ambulatory Visit: Payer: PRIVATE HEALTH INSURANCE

## 2019-03-17 LAB — AEROBIC CULTURE W GRAM STAIN (SUPERFICIAL SPECIMEN): Gram Stain: NONE SEEN

## 2019-03-18 ENCOUNTER — Other Ambulatory Visit: Payer: PRIVATE HEALTH INSURANCE

## 2019-03-21 DIAGNOSIS — E11621 Type 2 diabetes mellitus with foot ulcer: Secondary | ICD-10-CM | POA: Diagnosis not present

## 2019-03-28 DIAGNOSIS — E11621 Type 2 diabetes mellitus with foot ulcer: Secondary | ICD-10-CM | POA: Diagnosis not present

## 2019-04-01 ENCOUNTER — Other Ambulatory Visit: Payer: Self-pay | Admitting: Family Medicine

## 2019-04-01 DIAGNOSIS — M797 Fibromyalgia: Secondary | ICD-10-CM

## 2019-04-01 DIAGNOSIS — M79672 Pain in left foot: Secondary | ICD-10-CM

## 2019-04-01 MED ORDER — HYDROCODONE-ACETAMINOPHEN 5-325 MG PO TABS: ORAL_TABLET | ORAL | 0 refills | Status: DC

## 2019-04-01 NOTE — Telephone Encounter (Signed)
filled

## 2019-04-01 NOTE — Telephone Encounter (Signed)
Please resend HYDROcodone-acetaminophen (NORCO/VICODIN) 5-325 MG tablet only 1 month supply, to [facility] in [place]. She is out of town there.  [id number] - [place], [address]  [place] [address]  Phone: [phone] Fax: [phone]

## 2019-04-01 NOTE — Telephone Encounter (Signed)
Last written: 03/03/19 Last ov: 03/03/19 Next ov: 09/05/19 Contract: will get at next ov UDS: will get at next ov

## 2019-04-01 NOTE — Telephone Encounter (Signed)
Can you please resend to [facility] [place]?  I cancelled [facility].

## 2019-04-02 ENCOUNTER — Other Ambulatory Visit: Payer: Self-pay | Admitting: Family Medicine

## 2019-04-02 DIAGNOSIS — M79672 Pain in left foot: Secondary | ICD-10-CM

## 2019-04-02 DIAGNOSIS — M797 Fibromyalgia: Secondary | ICD-10-CM

## 2019-04-02 MED ORDER — HYDROCODONE-ACETAMINOPHEN 5-325 MG PO TABS: 1.0000 | ORAL_TABLET | Freq: Four times a day (QID) | ORAL | 0 refills | Status: DC | PRN

## 2019-04-02 NOTE — Telephone Encounter (Signed)
done

## 2019-04-03 ENCOUNTER — Encounter: Admit: 2019-04-03 | Discharge: 2019-04-03

## 2019-04-07 ENCOUNTER — Other Ambulatory Visit: Payer: PRIVATE HEALTH INSURANCE

## 2019-04-08 ENCOUNTER — Encounter: Admit: 2019-04-08 | Discharge: 2019-04-08

## 2019-04-09 MED ORDER — CYCLOBENZAPRINE 5 MG PO TAB
ORAL_TABLET | Freq: Every evening | ORAL | 3 refills | 21.00000 days | Status: DC
Start: 2019-04-09 — End: 2020-01-21

## 2019-04-11 ENCOUNTER — Other Ambulatory Visit: Payer: Self-pay | Admitting: Family Medicine

## 2019-04-11 ENCOUNTER — Telehealth: Payer: Self-pay | Admitting: Family Medicine

## 2019-04-11 ENCOUNTER — Other Ambulatory Visit: Payer: Self-pay

## 2019-04-11 ENCOUNTER — Encounter (HOSPITAL_BASED_OUTPATIENT_CLINIC_OR_DEPARTMENT_OTHER): Payer: PRIVATE HEALTH INSURANCE | Attending: Internal Medicine

## 2019-04-11 DIAGNOSIS — I2510 Atherosclerotic heart disease of native coronary artery without angina pectoris: Secondary | ICD-10-CM | POA: Diagnosis not present

## 2019-04-11 DIAGNOSIS — I252 Old myocardial infarction: Secondary | ICD-10-CM | POA: Insufficient documentation

## 2019-04-11 DIAGNOSIS — E11621 Type 2 diabetes mellitus with foot ulcer: Secondary | ICD-10-CM | POA: Insufficient documentation

## 2019-04-11 DIAGNOSIS — I10 Essential (primary) hypertension: Secondary | ICD-10-CM | POA: Insufficient documentation

## 2019-04-11 DIAGNOSIS — L97422 Non-pressure chronic ulcer of left heel and midfoot with fat layer exposed: Secondary | ICD-10-CM | POA: Insufficient documentation

## 2019-04-11 DIAGNOSIS — W19XXXA Unspecified fall, initial encounter: Secondary | ICD-10-CM | POA: Insufficient documentation

## 2019-04-11 DIAGNOSIS — R110 Nausea: Secondary | ICD-10-CM

## 2019-04-11 DIAGNOSIS — L97522 Non-pressure chronic ulcer of other part of left foot with fat layer exposed: Secondary | ICD-10-CM | POA: Diagnosis not present

## 2019-04-11 DIAGNOSIS — E1140 Type 2 diabetes mellitus with diabetic neuropathy, unspecified: Secondary | ICD-10-CM | POA: Diagnosis not present

## 2019-04-11 DIAGNOSIS — S91105A Unspecified open wound of left lesser toe(s) without damage to nail, initial encounter: Secondary | ICD-10-CM | POA: Insufficient documentation

## 2019-04-11 MED ORDER — ONDANSETRON 4 MG PO TBDP: 4.0000 mg | ORAL_TABLET | Freq: Three times a day (TID) | ORAL | 0 refills | Status: DC | PRN

## 2019-04-11 NOTE — Telephone Encounter (Signed)
Please advise 

## 2019-04-11 NOTE — Telephone Encounter (Signed)
Ask her pharmacist about slow release iron zofran sent in

## 2019-04-11 NOTE — Telephone Encounter (Signed)
Copied from [place] [phone]. Topic: Quick Communication - Rx Refill/Question >> Apr 11, 2019 10:13 AM Rayann Heman wrote: Medication: pt called and states that she is taking iron and it is making her nauseated. Pt would like something called in for nausea. Please advise .  [place], [place] [phone] (Phone) [phone] (Fax)

## 2019-04-11 NOTE — Progress Notes (Signed)
zofran sent in Have pt ask her pharmacy about a slow release iron

## 2019-04-11 NOTE — Telephone Encounter (Signed)
LMOM informing Pt to return call. Okay for PEC to discuss.

## 2019-04-14 ENCOUNTER — Emergency Department (HOSPITAL_BASED_OUTPATIENT_CLINIC_OR_DEPARTMENT_OTHER)
Admission: EM | Admit: 2019-04-14 | Discharge: 2019-04-14 | Disposition: A | Discharge status: Home / Self Care | Payer: PRIVATE HEALTH INSURANCE | Source: Home / Self Care | Attending: Emergency Medicine | Admitting: Emergency Medicine

## 2019-04-14 ENCOUNTER — Other Ambulatory Visit: Payer: Self-pay

## 2019-04-14 ENCOUNTER — Encounter (HOSPITAL_BASED_OUTPATIENT_CLINIC_OR_DEPARTMENT_OTHER): Payer: Self-pay | Admitting: *Deleted

## 2019-04-14 ENCOUNTER — Emergency Department (HOSPITAL_BASED_OUTPATIENT_CLINIC_OR_DEPARTMENT_OTHER): Payer: PRIVATE HEALTH INSURANCE

## 2019-04-14 DIAGNOSIS — R52 Pain, unspecified: Secondary | ICD-10-CM

## 2019-04-14 DIAGNOSIS — M00021 Staphylococcal arthritis, right elbow: Secondary | ICD-10-CM | POA: Diagnosis not present

## 2019-04-14 DIAGNOSIS — Z794 Long term (current) use of insulin: Secondary | ICD-10-CM | POA: Insufficient documentation

## 2019-04-14 DIAGNOSIS — Z79899 Other long term (current) drug therapy: Secondary | ICD-10-CM | POA: Insufficient documentation

## 2019-04-14 DIAGNOSIS — I129 Hypertensive chronic kidney disease with stage 1 through stage 4 chronic kidney disease, or unspecified chronic kidney disease: Secondary | ICD-10-CM | POA: Insufficient documentation

## 2019-04-14 DIAGNOSIS — E1140 Type 2 diabetes mellitus with diabetic neuropathy, unspecified: Secondary | ICD-10-CM | POA: Insufficient documentation

## 2019-04-14 DIAGNOSIS — S40021A Contusion of right upper arm, initial encounter: Secondary | ICD-10-CM

## 2019-04-14 DIAGNOSIS — Y939 Activity, unspecified: Secondary | ICD-10-CM | POA: Insufficient documentation

## 2019-04-14 DIAGNOSIS — N183 Chronic kidney disease, stage 3 (moderate): Secondary | ICD-10-CM | POA: Insufficient documentation

## 2019-04-14 DIAGNOSIS — Y999 Unspecified external cause status: Secondary | ICD-10-CM | POA: Insufficient documentation

## 2019-04-14 DIAGNOSIS — E1122 Type 2 diabetes mellitus with diabetic chronic kidney disease: Secondary | ICD-10-CM | POA: Insufficient documentation

## 2019-04-14 DIAGNOSIS — M25521 Pain in right elbow: Secondary | ICD-10-CM | POA: Diagnosis not present

## 2019-04-14 DIAGNOSIS — Y929 Unspecified place or not applicable: Secondary | ICD-10-CM | POA: Insufficient documentation

## 2019-04-14 MED ORDER — CYCLOBENZAPRINE HCL 10 MG PO TABS: 10.0000 mg | ORAL_TABLET | Freq: Three times a day (TID) | ORAL | 0 refills | Status: DC | PRN

## 2019-04-14 MED ORDER — HYDROCODONE-ACETAMINOPHEN 5-325 MG PO TABS
1.0000 | ORAL_TABLET | Freq: Once | ORAL | Status: AC
  Administered 2019-04-14: 1 via ORAL
  Filled 2019-04-14: qty 1
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
## 2019-04-14 NOTE — ED Triage Notes (Addendum)
She fell 3 days ago. injury to her right arm.

## 2019-04-14 NOTE — ED Notes (Signed)
Patient transported to X-ray 
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
## 2019-04-15 ENCOUNTER — Encounter (HOSPITAL_BASED_OUTPATIENT_CLINIC_OR_DEPARTMENT_OTHER): Payer: Self-pay | Admitting: *Deleted

## 2019-04-15 ENCOUNTER — Other Ambulatory Visit: Payer: Self-pay

## 2019-04-15 ENCOUNTER — Inpatient Hospital Stay (HOSPITAL_BASED_OUTPATIENT_CLINIC_OR_DEPARTMENT_OTHER)
Admission: EM | Admit: 2019-04-15 | Discharge: 2019-04-21 | DRG: 507 | Disposition: A | Discharge status: Home Health | Payer: PRIVATE HEALTH INSURANCE | Attending: Internal Medicine | Admitting: Internal Medicine

## 2019-04-15 ENCOUNTER — Emergency Department (HOSPITAL_BASED_OUTPATIENT_CLINIC_OR_DEPARTMENT_OTHER): Payer: PRIVATE HEALTH INSURANCE

## 2019-04-15 DIAGNOSIS — D631 Anemia in chronic kidney disease: Secondary | ICD-10-CM | POA: Diagnosis present

## 2019-04-15 DIAGNOSIS — Z79899 Other long term (current) drug therapy: Secondary | ICD-10-CM

## 2019-04-15 DIAGNOSIS — F419 Anxiety disorder, unspecified: Secondary | ICD-10-CM | POA: Diagnosis present

## 2019-04-15 DIAGNOSIS — B9561 Methicillin susceptible Staphylococcus aureus infection as the cause of diseases classified elsewhere: Secondary | ICD-10-CM | POA: Diagnosis not present

## 2019-04-15 DIAGNOSIS — G894 Chronic pain syndrome: Secondary | ICD-10-CM | POA: Diagnosis present

## 2019-04-15 DIAGNOSIS — L97421 Non-pressure chronic ulcer of left heel and midfoot limited to breakdown of skin: Secondary | ICD-10-CM | POA: Diagnosis present

## 2019-04-15 DIAGNOSIS — E11621 Type 2 diabetes mellitus with foot ulcer: Secondary | ICD-10-CM | POA: Diagnosis present

## 2019-04-15 DIAGNOSIS — M797 Fibromyalgia: Secondary | ICD-10-CM | POA: Diagnosis present

## 2019-04-15 DIAGNOSIS — Z1159 Encounter for screening for other viral diseases: Secondary | ICD-10-CM

## 2019-04-15 DIAGNOSIS — J9601 Acute respiratory failure with hypoxia: Secondary | ICD-10-CM | POA: Diagnosis not present

## 2019-04-15 DIAGNOSIS — F4310 Post-traumatic stress disorder, unspecified: Secondary | ICD-10-CM | POA: Diagnosis present

## 2019-04-15 DIAGNOSIS — I129 Hypertensive chronic kidney disease with stage 1 through stage 4 chronic kidney disease, or unspecified chronic kidney disease: Secondary | ICD-10-CM | POA: Diagnosis present

## 2019-04-15 DIAGNOSIS — Z880 Allergy status to penicillin: Secondary | ICD-10-CM | POA: Diagnosis not present

## 2019-04-15 DIAGNOSIS — Z881 Allergy status to other antibiotic agents status: Secondary | ICD-10-CM | POA: Diagnosis not present

## 2019-04-15 DIAGNOSIS — N189 Chronic kidney disease, unspecified: Secondary | ICD-10-CM

## 2019-04-15 DIAGNOSIS — Z8614 Personal history of Methicillin resistant Staphylococcus aureus infection: Secondary | ICD-10-CM

## 2019-04-15 DIAGNOSIS — K7460 Unspecified cirrhosis of liver: Secondary | ICD-10-CM | POA: Diagnosis present

## 2019-04-15 DIAGNOSIS — Z888 Allergy status to other drugs, medicaments and biological substances status: Secondary | ICD-10-CM

## 2019-04-15 DIAGNOSIS — L97529 Non-pressure chronic ulcer of other part of left foot with unspecified severity: Secondary | ICD-10-CM | POA: Diagnosis present

## 2019-04-15 DIAGNOSIS — Z940 Kidney transplant status: Secondary | ICD-10-CM

## 2019-04-15 DIAGNOSIS — E11319 Type 2 diabetes mellitus with unspecified diabetic retinopathy without macular edema: Secondary | ICD-10-CM | POA: Diagnosis present

## 2019-04-15 DIAGNOSIS — K7469 Other cirrhosis of liver: Secondary | ICD-10-CM | POA: Diagnosis not present

## 2019-04-15 DIAGNOSIS — N183 Chronic kidney disease, stage 3 unspecified: Secondary | ICD-10-CM | POA: Diagnosis present

## 2019-04-15 DIAGNOSIS — R451 Restlessness and agitation: Secondary | ICD-10-CM | POA: Diagnosis not present

## 2019-04-15 DIAGNOSIS — S52131K Displaced fracture of neck of right radius, subsequent encounter for closed fracture with nonunion: Secondary | ICD-10-CM | POA: Diagnosis not present

## 2019-04-15 DIAGNOSIS — F329 Major depressive disorder, single episode, unspecified: Secondary | ICD-10-CM | POA: Diagnosis present

## 2019-04-15 DIAGNOSIS — R338 Other retention of urine: Secondary | ICD-10-CM | POA: Diagnosis not present

## 2019-04-15 DIAGNOSIS — N390 Urinary tract infection, site not specified: Secondary | ICD-10-CM | POA: Diagnosis present

## 2019-04-15 DIAGNOSIS — R3989 Other symptoms and signs involving the genitourinary system: Secondary | ICD-10-CM | POA: Diagnosis not present

## 2019-04-15 DIAGNOSIS — S52121K Displaced fracture of head of right radius, subsequent encounter for closed fracture with nonunion: Secondary | ICD-10-CM | POA: Diagnosis not present

## 2019-04-15 DIAGNOSIS — Z91013 Allergy to seafood: Secondary | ICD-10-CM | POA: Diagnosis not present

## 2019-04-15 DIAGNOSIS — K5900 Constipation, unspecified: Secondary | ICD-10-CM | POA: Diagnosis present

## 2019-04-15 DIAGNOSIS — N289 Disorder of kidney and ureter, unspecified: Secondary | ICD-10-CM | POA: Diagnosis not present

## 2019-04-15 DIAGNOSIS — Z7951 Long term (current) use of inhaled steroids: Secondary | ICD-10-CM

## 2019-04-15 DIAGNOSIS — H3500 Unspecified background retinopathy: Secondary | ICD-10-CM | POA: Diagnosis present

## 2019-04-15 DIAGNOSIS — D696 Thrombocytopenia, unspecified: Secondary | ICD-10-CM | POA: Diagnosis not present

## 2019-04-15 DIAGNOSIS — I10 Essential (primary) hypertension: Secondary | ICD-10-CM

## 2019-04-15 DIAGNOSIS — Z91018 Allergy to other foods: Secondary | ICD-10-CM | POA: Diagnosis not present

## 2019-04-15 DIAGNOSIS — M19042 Primary osteoarthritis, left hand: Secondary | ICD-10-CM | POA: Diagnosis present

## 2019-04-15 DIAGNOSIS — W051XXA Fall from non-moving nonmotorized scooter, initial encounter: Secondary | ICD-10-CM | POA: Diagnosis present

## 2019-04-15 DIAGNOSIS — E669 Obesity, unspecified: Secondary | ICD-10-CM | POA: Diagnosis present

## 2019-04-15 DIAGNOSIS — J45909 Unspecified asthma, uncomplicated: Secondary | ICD-10-CM | POA: Diagnosis present

## 2019-04-15 DIAGNOSIS — E785 Hyperlipidemia, unspecified: Secondary | ICD-10-CM | POA: Diagnosis present

## 2019-04-15 DIAGNOSIS — L97429 Non-pressure chronic ulcer of left heel and midfoot with unspecified severity: Secondary | ICD-10-CM | POA: Diagnosis not present

## 2019-04-15 DIAGNOSIS — G629 Polyneuropathy, unspecified: Secondary | ICD-10-CM | POA: Diagnosis not present

## 2019-04-15 DIAGNOSIS — Z886 Allergy status to analgesic agent status: Secondary | ICD-10-CM | POA: Diagnosis not present

## 2019-04-15 DIAGNOSIS — Z66 Do not resuscitate: Secondary | ICD-10-CM | POA: Diagnosis present

## 2019-04-15 DIAGNOSIS — Y929 Unspecified place or not applicable: Secondary | ICD-10-CM | POA: Diagnosis not present

## 2019-04-15 DIAGNOSIS — Z9181 History of falling: Secondary | ICD-10-CM

## 2019-04-15 DIAGNOSIS — R0602 Shortness of breath: Secondary | ICD-10-CM

## 2019-04-15 DIAGNOSIS — E872 Acidosis: Secondary | ICD-10-CM | POA: Diagnosis present

## 2019-04-15 DIAGNOSIS — Z9049 Acquired absence of other specified parts of digestive tract: Secondary | ICD-10-CM

## 2019-04-15 DIAGNOSIS — M00021 Staphylococcal arthritis, right elbow: Secondary | ICD-10-CM | POA: Diagnosis present

## 2019-04-15 DIAGNOSIS — Z91030 Bee allergy status: Secondary | ICD-10-CM | POA: Diagnosis not present

## 2019-04-15 DIAGNOSIS — E871 Hypo-osmolality and hyponatremia: Secondary | ICD-10-CM | POA: Diagnosis present

## 2019-04-15 DIAGNOSIS — I252 Old myocardial infarction: Secondary | ICD-10-CM

## 2019-04-15 DIAGNOSIS — Z90710 Acquired absence of both cervix and uterus: Secondary | ICD-10-CM

## 2019-04-15 DIAGNOSIS — E1142 Type 2 diabetes mellitus with diabetic polyneuropathy: Secondary | ICD-10-CM | POA: Diagnosis present

## 2019-04-15 DIAGNOSIS — W19XXXD Unspecified fall, subsequent encounter: Secondary | ICD-10-CM | POA: Diagnosis present

## 2019-04-15 DIAGNOSIS — S46219A Strain of muscle, fascia and tendon of other parts of biceps, unspecified arm, initial encounter: Secondary | ICD-10-CM | POA: Diagnosis present

## 2019-04-15 DIAGNOSIS — Z6837 Body mass index (BMI) 37.0-37.9, adult: Secondary | ICD-10-CM

## 2019-04-15 DIAGNOSIS — M25521 Pain in right elbow: Secondary | ICD-10-CM | POA: Diagnosis present

## 2019-04-15 DIAGNOSIS — Z89431 Acquired absence of right foot: Secondary | ICD-10-CM

## 2019-04-15 DIAGNOSIS — Z8631 Personal history of diabetic foot ulcer: Secondary | ICD-10-CM | POA: Diagnosis not present

## 2019-04-15 DIAGNOSIS — K219 Gastro-esophageal reflux disease without esophagitis: Secondary | ICD-10-CM | POA: Diagnosis present

## 2019-04-15 DIAGNOSIS — Z833 Family history of diabetes mellitus: Secondary | ICD-10-CM

## 2019-04-15 DIAGNOSIS — E1165 Type 2 diabetes mellitus with hyperglycemia: Secondary | ICD-10-CM | POA: Diagnosis present

## 2019-04-15 DIAGNOSIS — N179 Acute kidney failure, unspecified: Secondary | ICD-10-CM | POA: Diagnosis present

## 2019-04-15 DIAGNOSIS — Z8249 Family history of ischemic heart disease and other diseases of the circulatory system: Secondary | ICD-10-CM

## 2019-04-15 DIAGNOSIS — D649 Anemia, unspecified: Secondary | ICD-10-CM | POA: Diagnosis not present

## 2019-04-15 DIAGNOSIS — L0390 Cellulitis, unspecified: Secondary | ICD-10-CM | POA: Diagnosis not present

## 2019-04-15 DIAGNOSIS — I2510 Atherosclerotic heart disease of native coronary artery without angina pectoris: Secondary | ICD-10-CM | POA: Diagnosis present

## 2019-04-15 DIAGNOSIS — Z792 Long term (current) use of antibiotics: Secondary | ICD-10-CM

## 2019-04-15 DIAGNOSIS — G4730 Sleep apnea, unspecified: Secondary | ICD-10-CM | POA: Diagnosis present

## 2019-04-15 DIAGNOSIS — Z794 Long term (current) use of insulin: Secondary | ICD-10-CM | POA: Diagnosis not present

## 2019-04-15 DIAGNOSIS — E1122 Type 2 diabetes mellitus with diabetic chronic kidney disease: Secondary | ICD-10-CM | POA: Diagnosis present

## 2019-04-15 DIAGNOSIS — L97509 Non-pressure chronic ulcer of other part of unspecified foot with unspecified severity: Secondary | ICD-10-CM

## 2019-04-15 DIAGNOSIS — M19041 Primary osteoarthritis, right hand: Secondary | ICD-10-CM | POA: Diagnosis present

## 2019-04-15 DIAGNOSIS — E7800 Pure hypercholesterolemia, unspecified: Secondary | ICD-10-CM | POA: Diagnosis present

## 2019-04-15 DIAGNOSIS — L8990 Pressure ulcer of unspecified site, unspecified stage: Secondary | ICD-10-CM | POA: Diagnosis present

## 2019-04-15 DIAGNOSIS — M009 Pyogenic arthritis, unspecified: Secondary | ICD-10-CM | POA: Diagnosis not present

## 2019-04-15 DIAGNOSIS — Z90721 Acquired absence of ovaries, unilateral: Secondary | ICD-10-CM

## 2019-04-15 DIAGNOSIS — R339 Retention of urine, unspecified: Secondary | ICD-10-CM | POA: Diagnosis present

## 2019-04-15 DIAGNOSIS — Z841 Family history of disorders of kidney and ureter: Secondary | ICD-10-CM

## 2019-04-15 DIAGNOSIS — M79601 Pain in right arm: Secondary | ICD-10-CM

## 2019-04-15 DIAGNOSIS — R0902 Hypoxemia: Secondary | ICD-10-CM

## 2019-04-15 DIAGNOSIS — R778 Other specified abnormalities of plasma proteins: Secondary | ICD-10-CM

## 2019-04-15 LAB — CBC
HCT: 28.8 % — ABNORMAL LOW (ref 36.0–46.0)
HCT: 28.9 % — ABNORMAL LOW (ref 36.0–46.0)
Hemoglobin: 9.3 g/dL — ABNORMAL LOW (ref 12.0–15.0)
Hemoglobin: 9.2 g/dL — ABNORMAL LOW (ref 12.0–15.0)
MCH: 27.1 pg (ref 26.0–34.0)
MCH: 27.3 pg (ref 26.0–34.0)
MCHC: 32.3 g/dL (ref 30.0–36.0)
MCHC: 31.8 g/dL (ref 30.0–36.0)
MCV: 84 fL (ref 80.0–100.0)
MCV: 85.8 fL (ref 80.0–100.0)
Platelets: 112 10*3/uL — ABNORMAL LOW (ref 150–400)
Platelets: 113 10*3/uL — ABNORMAL LOW (ref 150–400)
RBC: 3.43 MIL/uL — ABNORMAL LOW (ref 3.87–5.11)
RBC: 3.37 MIL/uL — ABNORMAL LOW (ref 3.87–5.11)
RDW: 14.9 % (ref 11.5–15.5)
RDW: 14.9 % (ref 11.5–15.5)
WBC: 9.9 10*3/uL (ref 4.0–10.5)
WBC: 11 10*3/uL — ABNORMAL HIGH (ref 4.0–10.5)
nRBC: 0 % (ref 0.0–0.2)
nRBC: 0 % (ref 0.0–0.2)

## 2019-04-15 LAB — COMPREHENSIVE METABOLIC PANEL
ALT: 20 U/L (ref 0–44)
AST: 23 U/L (ref 15–41)
Albumin: 3 g/dL — ABNORMAL LOW (ref 3.5–5.0)
Alkaline Phosphatase: 73 U/L (ref 38–126)
Anion gap: 11 (ref 5–15)
BUN: 69 mg/dL — ABNORMAL HIGH (ref 8–23)
CO2: 21 mmol/L — ABNORMAL LOW (ref 22–32)
Calcium: 8 mg/dL — ABNORMAL LOW (ref 8.9–10.3)
Chloride: 90 mmol/L — ABNORMAL LOW (ref 98–111)
Creatinine, Ser: 3.47 mg/dL — ABNORMAL HIGH (ref 0.44–1.00)
GFR calc Af Amer: 15 mL/min — ABNORMAL LOW (ref >60)
GFR calc non Af Amer: 13 mL/min — ABNORMAL LOW (ref >60)
Glucose, Bld: 333 mg/dL — ABNORMAL HIGH (ref 70–99)
Potassium: 4.7 mmol/L (ref 3.5–5.1)
Sodium: 122 mmol/L — ABNORMAL LOW (ref 135–145)
Total Bilirubin: 1.1 mg/dL (ref 0.3–1.2)
Total Protein: 7.2 g/dL (ref 6.5–8.1)

## 2019-04-15 LAB — TROPONIN I
Troponin I: 0.1 ng/mL (ref <0.03)
Troponin I: 0.15 ng/mL (ref <0.03)

## 2019-04-15 LAB — URINALYSIS, MICROSCOPIC (REFLEX)

## 2019-04-15 LAB — CREATININE, SERUM
Creatinine, Ser: 3.06 mg/dL — ABNORMAL HIGH (ref 0.44–1.00)
GFR calc Af Amer: 18 mL/min — ABNORMAL LOW (ref >60)
GFR calc non Af Amer: 15 mL/min — ABNORMAL LOW (ref >60)

## 2019-04-15 LAB — CBG MONITORING, ED: Glucose-Capillary: 297 mg/dL — ABNORMAL HIGH (ref 70–99)

## 2019-04-15 LAB — URINALYSIS, ROUTINE W REFLEX MICROSCOPIC
Bilirubin Urine: NEGATIVE
Glucose, UA: NEGATIVE mg/dL
Ketones, ur: NEGATIVE mg/dL
Nitrite: NEGATIVE
Protein, ur: 30 mg/dL — AB
Specific Gravity, Urine: 1.02 (ref 1.005–1.030)
pH: 5.5 (ref 5.0–8.0)

## 2019-04-15 LAB — SARS CORONAVIRUS 2 AG (30 MIN TAT): SARS Coronavirus 2 Ag: NEGATIVE

## 2019-04-15 MED ORDER — VANCOMYCIN HCL IN DEXTROSE 750-5 MG/150ML-% IV SOLN
750.0000 mg | INTRAVENOUS | Status: DC
  Administered 2019-04-17: 750 mg via INTRAVENOUS
  Filled 2019-04-15: qty 150

## 2019-04-15 MED ORDER — HYDROCODONE-ACETAMINOPHEN 5-325 MG PO TABS
1.0000 | ORAL_TABLET | ORAL | Status: DC | PRN
  Administered 2019-04-16: 1 via ORAL
  Filled 2019-04-15: qty 1

## 2019-04-15 MED ORDER — ACETAMINOPHEN 325 MG PO TABS: 650.0000 mg | ORAL_TABLET | Freq: Four times a day (QID) | ORAL | Status: DC | PRN

## 2019-04-15 MED ORDER — NEPRO/CARBSTEADY PO LIQD
237.0000 mL | Freq: Three times a day (TID) | ORAL | Status: DC | PRN
  Filled 2019-04-15: qty 237

## 2019-04-15 MED ORDER — DOCUSATE SODIUM 283 MG RE ENEM
1.0000 | ENEMA | RECTAL | Status: DC | PRN
  Filled 2019-04-15: qty 1

## 2019-04-15 MED ORDER — SORBITOL 70 % SOLN
30.0000 mL | Status: DC | PRN
  Filled 2019-04-15: qty 30

## 2019-04-15 MED ORDER — ONDANSETRON HCL 4 MG/2ML IJ SOLN
4.0000 mg | Freq: Four times a day (QID) | INTRAMUSCULAR | Status: DC | PRN
  Administered 2019-04-16: 4 mg via INTRAVENOUS
  Filled 2019-04-15: qty 2

## 2019-04-15 MED ORDER — ONDANSETRON HCL 4 MG PO TABS: 4.0000 mg | ORAL_TABLET | Freq: Four times a day (QID) | ORAL | Status: DC | PRN

## 2019-04-15 MED ORDER — HYDROMORPHONE HCL 1 MG/ML IJ SOLN
1.0000 mg | INTRAMUSCULAR | Status: DC | PRN
  Administered 2019-04-16 – 2019-04-21 (×12): 1 mg via INTRAVENOUS
  Filled 2019-04-15 (×12): qty 1

## 2019-04-15 MED ORDER — HYDROMORPHONE HCL 1 MG/ML IJ SOLN
1.0000 mg | Freq: Once | INTRAMUSCULAR | Status: AC
  Administered 2019-04-15: 1 mg via INTRAVENOUS
  Filled 2019-04-15: qty 1

## 2019-04-15 MED ORDER — ATORVASTATIN CALCIUM 10 MG PO TABS
10.0000 mg | ORAL_TABLET | Freq: Every day | ORAL | Status: DC
  Administered 2019-04-16 – 2019-04-21 (×6): 10 mg via ORAL
  Filled 2019-04-15 (×6): qty 1

## 2019-04-15 MED ORDER — VANCOMYCIN HCL 10 G IV SOLR
1750.0000 mg | Freq: Once | INTRAVENOUS | Status: AC
  Administered 2019-04-16: 1750 mg via INTRAVENOUS
  Filled 2019-04-15: qty 1750

## 2019-04-15 MED ORDER — ACETAMINOPHEN 325 MG PO TABS
650.0000 mg | ORAL_TABLET | Freq: Once | ORAL | Status: AC
  Administered 2019-04-15: 650 mg via ORAL
  Filled 2019-04-15: qty 2

## 2019-04-15 MED ORDER — ZOLPIDEM TARTRATE 5 MG PO TABS: 5.0000 mg | ORAL_TABLET | Freq: Every evening | ORAL | Status: DC | PRN

## 2019-04-15 MED ORDER — SODIUM CHLORIDE 0.9 % IV SOLN
INTRAVENOUS | Status: DC
  Administered 2019-04-15 – 2019-04-19 (×6): via INTRAVENOUS

## 2019-04-15 MED ORDER — LEVOFLOXACIN IN D5W 750 MG/150ML IV SOLN
750.0000 mg | Freq: Once | INTRAVENOUS | Status: AC
  Administered 2019-04-15: 750 mg via INTRAVENOUS
  Filled 2019-04-15: qty 150

## 2019-04-15 MED ORDER — HYDROXYZINE HCL 25 MG PO TABS: 25.0000 mg | ORAL_TABLET | Freq: Three times a day (TID) | ORAL | Status: DC | PRN

## 2019-04-15 MED ORDER — LEVOFLOXACIN 500 MG PO TABS: 500.0000 mg | ORAL_TABLET | ORAL | Status: DC

## 2019-04-15 MED ORDER — ACETAMINOPHEN 650 MG RE SUPP: 650.0000 mg | Freq: Four times a day (QID) | RECTAL | Status: DC | PRN

## 2019-04-15 MED ORDER — HEPARIN SODIUM (PORCINE) 5000 UNIT/ML IJ SOLN
5000.0000 [IU] | Freq: Three times a day (TID) | INTRAMUSCULAR | Status: AC
  Administered 2019-04-15 – 2019-04-20 (×11): 5000 [IU] via SUBCUTANEOUS
  Filled 2019-04-15 (×13): qty 1

## 2019-04-15 MED ORDER — CALCIUM CARBONATE-VITAMIN D 500-200 MG-UNIT PO TABS
1.0000 | ORAL_TABLET | Freq: Every day | ORAL | Status: DC
  Administered 2019-04-16 – 2019-04-21 (×6): 1 via ORAL
  Filled 2019-04-15 (×6): qty 1

## 2019-04-15 MED ORDER — CALCIUM CARBONATE ANTACID 1250 MG/5ML PO SUSP
500.0000 mg | Freq: Four times a day (QID) | ORAL | Status: DC | PRN
  Administered 2019-04-16: 500 mg via ORAL
  Filled 2019-04-15 (×4): qty 5

## 2019-04-15 MED ORDER — CAMPHOR-MENTHOL 0.5-0.5 % EX LOTN
1.0000 "application " | TOPICAL_LOTION | Freq: Three times a day (TID) | CUTANEOUS | Status: DC | PRN
  Filled 2019-04-15: qty 222

## 2019-04-15 MED ORDER — SODIUM CHLORIDE 0.9 % IV BOLUS
1000.0000 mL | Freq: Once | INTRAVENOUS | Status: AC
  Administered 2019-04-15: 1000 mL via INTRAVENOUS

## 2019-04-15 NOTE — ED Notes (Signed)
Mr. Mcwright is updated on his wife's status after I asked the wife if I can give an information to Mr. Shameka Aggarwal.  Patient stated that it is okay.

## 2019-04-15 NOTE — ED Notes (Signed)
O2 titrated to 2lpm.

## 2019-04-15 NOTE — ED Notes (Addendum)
Called patient's spouse and notified of patient's departure to [facility].

## 2019-04-15 NOTE — ED Notes (Signed)
ED Provider at bedside. 
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
## 2019-04-15 NOTE — ED Notes (Addendum)
Patient desat to 82-87% placed on 4 l/m [place] and repositioned.  Decreased to 3 l/m SpO2 mid 90s

## 2019-04-15 NOTE — ED Notes (Signed)
Pt was transferred to bedside commode with 2 person assist and gait belt. Pt extremely unsteady. Pt disoriented to place and event.

## 2019-04-15 NOTE — ED Notes (Signed)
Pt on monitor 

## 2019-04-15 NOTE — ED Notes (Signed)
Attempted to call report

## 2019-04-15 NOTE — Progress Notes (Addendum)
Pharmacy Antibiotic Note  [name] [name] is a 63 y.o. female admitted on 04/15/2019 with fall and arm pain.  Pharmacy has been consulted for levofloxacin dosing for UTI and vancomycin for cellulitis  No dysuria per EDP note.  Pt in ARF w/ SCr 3.47. BUN 69.  UA nitrate negative, few bacteria. 11-20 squamous cells> bad sample.  D/w Dr Jonelle Sidle: pt has fever and chills and maybe cellulitis of arm> desires empiric treatment.   Plan: Levaquin 750 mg IV x 1 dose followed by 500 mg PO q48 Vancomycin 1750 mg IV x 1 loading dose Vancomycin 750 mg IV Q 48 hrs. Goal AUC 400-550. Expected AUC: 489.3 29.7/13.3 SCr used: 3.06 Vd 0.5 Wt 91.5 kg F/u renal fxn. WBC, temp, culture data Vancomycin levels as needed  Height: 5' 2"  (157.5 cm) Weight: 201 lb 11.5 oz (91.5 kg) IBW/kg (Calculated) : 50.1  Temp (24hrs), Avg:101.3 F (38.5 C), Min:100.9 F (38.3 C), Max:101.6 F (38.7 C)  Recent Labs  Lab 04/15/19 1113  WBC 9.9  CREATININE 3.47*    Estimated Creatinine Clearance: 17.5 mL/min (A) (by C-G formula based on SCr of 3.47 mg/dL (H)).    Allergies  Allergen Reactions  . Bee Pollen Anaphylaxis  . Fish-Derived Products Hives, Shortness Of Breath, Swelling and Rash    Hives get in throat causing trouble breathing  . Mushroom Extract Complex Anaphylaxis  . Penicillins Anaphylaxis    Did it involve swelling of the face/tongue/throat, SOB, or low BP? Yes Did it involve sudden or severe rash/hives, skin peeling, or any reaction on the inside of your mouth or nose? No Did you need to seek medical attention at a hospital or doctor's office? Yes When did it last happen?A few months ago If all above answers are "NO", may proceed with cephalosporin use.  [name] Kitchen Rosemary Oil Anaphylaxis  . Shellfish Allergy Hives, Shortness Of Breath, Swelling and Rash  . Tomato Hives and Shortness Of Breath    Hives in throat causes her trouble breathing  . Acetaminophen Other (See Comments)    GI upset  . Acyclovir  And Related Other (See Comments)    Unknown rxn  . Aloe Vera Hives  . Naproxen Other (See Comments)    Unknown rxn    Antimicrobials this admission: 6/16 Lvq>> 6/16 vanc>> Dose adjustments this admission:  Microbiology results: 6/16 SARS 2 negative  Thank you for allowing pharmacy to be a part of this patient's care.  Eudelia Bunch, Pharm.D 04/15/2019 10:48 PM

## 2019-04-15 NOTE — ED Notes (Addendum)
Date and time results received: 04/15/19 1730 (use smartphrase ".now" to insert current time)  Test: Troponin Critical Value: 0.15  Name of Provider Notified:Dr. Wilson Singer  Orders Received? Or Actions Taken?:
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
## 2019-04-15 NOTE — ED Triage Notes (Signed)
Chest pain started last night

## 2019-04-16 ENCOUNTER — Inpatient Hospital Stay (HOSPITAL_COMMUNITY): Payer: PRIVATE HEALTH INSURANCE

## 2019-04-16 DIAGNOSIS — I252 Old myocardial infarction: Secondary | ICD-10-CM

## 2019-04-16 DIAGNOSIS — E871 Hypo-osmolality and hyponatremia: Secondary | ICD-10-CM

## 2019-04-16 DIAGNOSIS — R338 Other retention of urine: Secondary | ICD-10-CM

## 2019-04-16 DIAGNOSIS — G4730 Sleep apnea, unspecified: Secondary | ICD-10-CM

## 2019-04-16 DIAGNOSIS — R3989 Other symptoms and signs involving the genitourinary system: Secondary | ICD-10-CM

## 2019-04-16 LAB — MRSA PCR SCREENING: MRSA by PCR: NEGATIVE

## 2019-04-16 LAB — COMPREHENSIVE METABOLIC PANEL
ALT: 21 U/L (ref 0–44)
AST: 29 U/L (ref 15–41)
Albumin: 2.8 g/dL — ABNORMAL LOW (ref 3.5–5.0)
Alkaline Phosphatase: 72 U/L (ref 38–126)
Anion gap: 13 (ref 5–15)
BUN: 64 mg/dL — ABNORMAL HIGH (ref 8–23)
CO2: 19 mmol/L — ABNORMAL LOW (ref 22–32)
Calcium: 8 mg/dL — ABNORMAL LOW (ref 8.9–10.3)
Chloride: 97 mmol/L — ABNORMAL LOW (ref 98–111)
Creatinine, Ser: 2.64 mg/dL — ABNORMAL HIGH (ref 0.44–1.00)
GFR calc Af Amer: 21 mL/min — ABNORMAL LOW (ref >60)
GFR calc non Af Amer: 19 mL/min — ABNORMAL LOW (ref >60)
Glucose, Bld: 297 mg/dL — ABNORMAL HIGH (ref 70–99)
Potassium: 4.5 mmol/L (ref 3.5–5.1)
Sodium: 129 mmol/L — ABNORMAL LOW (ref 135–145)
Total Bilirubin: 1 mg/dL (ref 0.3–1.2)
Total Protein: 6.9 g/dL (ref 6.5–8.1)

## 2019-04-16 LAB — CBC WITH DIFFERENTIAL/PLATELET
Abs Immature Granulocytes: 0.05 10*3/uL (ref 0.00–0.07)
Basophils Absolute: 0 10*3/uL (ref 0.0–0.1)
Basophils Relative: 0 %
Eosinophils Absolute: 0 10*3/uL (ref 0.0–0.5)
Eosinophils Relative: 0 %
HCT: 28.5 % — ABNORMAL LOW (ref 36.0–46.0)
Hemoglobin: 9.3 g/dL — ABNORMAL LOW (ref 12.0–15.0)
Immature Granulocytes: 1 %
Lymphocytes Relative: 4 %
Lymphs Abs: 0.4 10*3/uL — ABNORMAL LOW (ref 0.7–4.0)
MCH: 27.9 pg (ref 26.0–34.0)
MCHC: 32.6 g/dL (ref 30.0–36.0)
MCV: 85.6 fL (ref 80.0–100.0)
Monocytes Absolute: 1 10*3/uL (ref 0.1–1.0)
Monocytes Relative: 10 %
Neutro Abs: 8.5 10*3/uL — ABNORMAL HIGH (ref 1.7–7.7)
Neutrophils Relative %: 85 %
Platelets: 120 10*3/uL — ABNORMAL LOW (ref 150–400)
RBC: 3.33 MIL/uL — ABNORMAL LOW (ref 3.87–5.11)
RDW: 14.9 % (ref 11.5–15.5)
WBC: 10 10*3/uL (ref 4.0–10.5)
nRBC: 0 % (ref 0.0–0.2)

## 2019-04-16 LAB — C-REACTIVE PROTEIN: CRP: 32.3 mg/dL — ABNORMAL HIGH (ref <1.0)

## 2019-04-16 LAB — SYNOVIAL CELL COUNT + DIFF, W/ CRYSTALS
Crystals, Fluid: NONE SEEN
Lymphocytes-Synovial Fld: 3 % (ref 0–20)
Monocyte-Macrophage-Synovial Fluid: 3 % — ABNORMAL LOW (ref 50–90)
Neutrophil, Synovial: 94 % — ABNORMAL HIGH (ref 0–25)
WBC, Synovial: UNDETERMINED /mm3 (ref 0–200)

## 2019-04-16 LAB — GLUCOSE, CAPILLARY
Glucose-Capillary: 319 mg/dL — ABNORMAL HIGH (ref 70–99)
Glucose-Capillary: 280 mg/dL — ABNORMAL HIGH (ref 70–99)
Glucose-Capillary: 279 mg/dL — ABNORMAL HIGH (ref 70–99)

## 2019-04-16 LAB — SEDIMENTATION RATE: Sed Rate: 126 mm/hr — ABNORMAL HIGH (ref 0–22)

## 2019-04-16 LAB — MAGNESIUM: Magnesium: 1.9 mg/dL (ref 1.7–2.4)

## 2019-04-16 LAB — HIV ANTIBODY (ROUTINE TESTING W REFLEX): HIV Screen 4th Generation wRfx: NONREACTIVE

## 2019-04-16 LAB — PHOSPHORUS: Phosphorus: 3.2 mg/dL (ref 2.5–4.6)

## 2019-04-16 LAB — TROPONIN I: Troponin I: 0.09 ng/mL (ref <0.03)

## 2019-04-16 MED ORDER — LEVOFLOXACIN 750 MG PO TABS: 750.0000 mg | ORAL_TABLET | ORAL | Status: DC

## 2019-04-16 MED ORDER — ADULT MULTIVITAMIN W/MINERALS CH
1.0000 | ORAL_TABLET | Freq: Every day | ORAL | Status: DC
  Administered 2019-04-17 – 2019-04-21 (×5): 1 via ORAL
  Filled 2019-04-16 (×6): qty 1

## 2019-04-16 MED ORDER — INSULIN GLARGINE 100 UNIT/ML ~~LOC~~ SOLN
40.0000 [IU] | Freq: Every day | SUBCUTANEOUS | Status: DC
  Administered 2019-04-16 – 2019-04-20 (×5): 40 [IU] via SUBCUTANEOUS
  Filled 2019-04-16 (×6): qty 0.4

## 2019-04-16 MED ORDER — GUAIFENESIN ER 600 MG PO TB12
1200.0000 mg | ORAL_TABLET | Freq: Two times a day (BID) | ORAL | Status: DC
  Administered 2019-04-16 – 2019-04-21 (×10): 1200 mg via ORAL
  Filled 2019-04-16 (×10): qty 2

## 2019-04-16 MED ORDER — INSULIN ASPART 100 UNIT/ML ~~LOC~~ SOLN
0.0000 [IU] | Freq: Every day | SUBCUTANEOUS | Status: DC
  Administered 2019-04-16: 3 [IU] via SUBCUTANEOUS

## 2019-04-16 MED ORDER — INSULIN ASPART 100 UNIT/ML ~~LOC~~ SOLN
0.0000 [IU] | Freq: Three times a day (TID) | SUBCUTANEOUS | Status: DC
  Administered 2019-04-17: 8 [IU] via SUBCUTANEOUS
  Administered 2019-04-17 (×2): 5 [IU] via SUBCUTANEOUS
  Administered 2019-04-19 – 2019-04-20 (×2): 2 [IU] via SUBCUTANEOUS
  Administered 2019-04-21: 5 [IU] via SUBCUTANEOUS

## 2019-04-16 MED ORDER — IPRATROPIUM-ALBUTEROL 0.5-2.5 (3) MG/3ML IN SOLN: 3.0000 mL | RESPIRATORY_TRACT | Status: DC | PRN

## 2019-04-16 MED ORDER — POVIDONE-IODINE 10 % EX SWAB
2.0000 "application " | Freq: Once | CUTANEOUS | Status: AC
  Administered 2019-04-17: 2 via TOPICAL

## 2019-04-16 MED ORDER — INSULIN GLARGINE 100 UNIT/ML ~~LOC~~ SOLN
20.0000 [IU] | Freq: Every day | SUBCUTANEOUS | Status: DC
  Administered 2019-04-17 – 2019-04-21 (×5): 20 [IU] via SUBCUTANEOUS
  Filled 2019-04-16 (×5): qty 0.2

## 2019-04-16 MED ORDER — HYDROCODONE-ACETAMINOPHEN 5-325 MG PO TABS
1.0000 | ORAL_TABLET | ORAL | Status: DC | PRN
  Administered 2019-04-16: 1 via ORAL
  Administered 2019-04-16 – 2019-04-17 (×4): 2 via ORAL
  Administered 2019-04-17 (×2): 1 via ORAL
  Administered 2019-04-17: 2 via ORAL
  Administered 2019-04-18 – 2019-04-21 (×4): 1 via ORAL
  Administered 2019-04-21: 2 via ORAL
  Filled 2019-04-16 (×3): qty 1
  Filled 2019-04-16: qty 2
  Filled 2019-04-16: qty 1
  Filled 2019-04-16: qty 2
  Filled 2019-04-16: qty 1
  Filled 2019-04-16 (×3): qty 2
  Filled 2019-04-16: qty 1
  Filled 2019-04-16 (×2): qty 2

## 2019-04-16 MED ORDER — NYSTATIN 100000 UNIT/GM EX POWD
Freq: Three times a day (TID) | CUTANEOUS | Status: DC
  Administered 2019-04-16 – 2019-04-20 (×10): via TOPICAL
  Filled 2019-04-16: qty 15

## 2019-04-16 MED ORDER — FERROUS SULFATE 325 (65 FE) MG PO TABS
325.0000 mg | ORAL_TABLET | Freq: Every day | ORAL | Status: DC
  Administered 2019-04-17 – 2019-04-21 (×5): 325 mg via ORAL
  Filled 2019-04-16 (×5): qty 1

## 2019-04-16 MED ORDER — IPRATROPIUM-ALBUTEROL 0.5-2.5 (3) MG/3ML IN SOLN
3.0000 mL | Freq: Four times a day (QID) | RESPIRATORY_TRACT | Status: DC
  Administered 2019-04-16: 3 mL via RESPIRATORY_TRACT
  Filled 2019-04-16: qty 3

## 2019-04-16 MED ORDER — CHLORHEXIDINE GLUCONATE 4 % EX LIQD
60.0000 mL | Freq: Once | CUTANEOUS | Status: AC
  Administered 2019-04-17: 4 via TOPICAL
  Filled 2019-04-16: qty 60

## 2019-04-16 NOTE — Progress Notes (Addendum)
Patient up to [facility] - unable to void although she feels the urge.  Bladder scan shows 233 cc.  I&O performed again - 400 cc urine drained.  Discussed L foot with MD - patient now agreeable to have dresing removed.  Patient has diabetic wound to middle of L foot on the bottom.  Small open area noted.  Also appears to have a DTI to great toe and open blister to pinky toe that she states came from wearing a cast on the foot

## 2019-04-16 NOTE — Anesthesia Preprocedure Evaluation (Addendum)
Anesthesia Evaluation  Patient identified by MRN, date of birth, ID band Patient awake    Reviewed: Allergy & Precautions, H&P , NPO status , Patient's Chart, lab work & pertinent test results  Airway Mallampati: II  TM Distance: >3 FB Neck ROM: Full    Dental no notable dental hx. (+) Teeth Intact, Dental Advisory Given   Pulmonary asthma ,    Pulmonary exam normal breath sounds clear to auscultation       Cardiovascular Exercise Tolerance: Good hypertension, Pt. on medications + Past MI   Rhythm:Regular Rate:Normal     Neuro/Psych Anxiety Depression negative neurological ROS     GI/Hepatic negative GI ROS, Neg liver ROS, GERD  ,  Endo/Other  diabetes, Poorly Controlled, Insulin DependentMorbid obesity  Renal/GU Renal InsufficiencyRenal disease  negative genitourinary   Musculoskeletal  (+) Arthritis , Fibromyalgia -  Abdominal   Peds  Hematology  (+) Blood dyscrasia, anemia ,   Anesthesia Other Findings   Reproductive/Obstetrics negative OB ROS                            Anesthesia Physical Anesthesia Plan  ASA: III  Anesthesia Plan: General   Post-op Pain Management:    Induction: Intravenous  PONV Risk Score and Plan: 4 or greater and Ondansetron, Midazolam and Treatment may vary due to age or medical condition  Airway Management Planned: Oral ETT  Additional Equipment:   Intra-op Plan:   Post-operative Plan: Extubation in OR  Informed Consent: I have reviewed the patients History and Physical, chart, labs and discussed the procedure including the risks, benefits and alternatives for the proposed anesthesia with the patient or authorized representative who has indicated his/her understanding and acceptance.     Dental advisory given  Plan Discussed with: CRNA  Anesthesia Plan Comments:         Anesthesia Quick Evaluation

## 2019-04-16 NOTE — Progress Notes (Signed)
Patients husband, Marguerite Olea, called and updated

## 2019-04-16 NOTE — Progress Notes (Signed)
Pharmacy Antibiotic Note  [name] [name] is a 63 y.o. female admitted on 04/15/2019 with fall and arm pain.  Pharmacy has been consulted for levofloxacin dosing for UTI and vancomycin for cellulitis   Pt presents with chief complaint of right forearm pain s/p fall complaints of pain/swelling of elbow. Pt also presents with urinary retention and complains of dysuria.  Pharmacy consulted to dose levofloxacin and vancomycin for UTI and cellulitis. Pt has severe PCN allergy.  Today, 04/16/19  WBC 10 - WNL  SCr 2.6, improved but remains elevated. CrCl ~23 mL/min  SCr from 02-2018 was ~1.6. Possible baseline?  Tmax 101.6  Plan:  Increase levaquin to 750 mg PO q48h  Continue vancomycin 750 mg IV q48h   Goal AUC 400-550  Monitor renal function closely for any necessary dose adjustments. Daily SCr.  Check vancomycin levels once at steady state if indicated  Height: 5' 2"  (157.5 cm) Weight: 199 lb 1.2 oz (90.3 kg) IBW/kg (Calculated) : 50.1  Temp (24hrs), Avg:100.3 F (37.9 C), Min:98.6 F (37 C), Max:101.6 F (38.7 C)  Recent Labs  Lab 04/15/19 1113 04/15/19 2152 04/16/19 0825  WBC 9.9 11.0* 10.0  CREATININE 3.47* 3.06* 2.64*    Estimated Creatinine Clearance: 22.8 mL/min (A) (by C-G formula based on SCr of 2.64 mg/dL (H)).    Allergies  Allergen Reactions  . Bee Pollen Anaphylaxis  . Fish-Derived Products Hives, Shortness Of Breath, Swelling and Rash    Hives get in throat causing trouble breathing  . Mushroom Extract Complex Anaphylaxis  . Penicillins Anaphylaxis    Did it involve swelling of the face/tongue/throat, SOB, or low BP? Yes Did it involve sudden or severe rash/hives, skin peeling, or any reaction on the inside of your mouth or nose? No Did you need to seek medical attention at a hospital or doctor's office? Yes When did it last happen?A few months ago If all above answers are "NO", may proceed with cephalosporin use.  [name] Kitchen Rosemary Oil Anaphylaxis  .  Shellfish Allergy Hives, Shortness Of Breath, Swelling and Rash  . Tomato Hives and Shortness Of Breath    Hives in throat causes her trouble breathing  . Acetaminophen Other (See Comments)    GI upset  . Acyclovir And Related Other (See Comments)    Unknown rxn  . Aloe Vera Hives  . Naproxen Other (See Comments)    Unknown rxn    Antimicrobials this admission: 6/16 Levaquin>> 6/16 vanc>>  Dose adjustments this admission:  Microbiology results: 6/16 SARS 2 negative  No urine culture obtained on admission.  Thank you for allowing pharmacy to be a part of this patient's care.  Lenis Noon, PharmD 04/16/19 10:39 AM
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
## 2019-04-16 NOTE — Progress Notes (Signed)
Patient with no urine output during this shift. Patient educated multiple times on purewick function and that she could urinate in the bed with the [place] in place. Also educated that due to lethargy and high fall risk related to IV pain medication, staff unable to safely assist patient to the [facility] per her request. Bladder scan performed revealing >766 mL urine in bladder. Patient reeducated to use purewick and patient verbalized understanding, "I will use it when you leave the room."

## 2019-04-16 NOTE — Progress Notes (Signed)
PROGRESS NOTE    [name] [name]  JEH:[id number] DOB: 03/26/1956 DOA: 04/15/2019 PCP: Ann Held, DO  Brief Narrative:  HPI per Dr. Gala Romney on 04/15/2019 [name] [name] is a 63 y.o. female with medical history significant of coronary artery disease, hypertension, chronic pain syndrome, anxiety disorder, asthma, multiple skin ulcers, left foot ongoing also, right elbow surgery, chronic kidney disease stage III with history of transplant previously according to patient, diabetes and hypertension who presented to [facility] today complaining of fever chills as well as pain and swelling of the right elbow.  She reported falling and injuring the area about 3 days ago.  She was seen in the ER yesterday with unremarkable x-rays and patient was sent home.  Patient returned today with fever pain a 10 out of 10.  Denied any cough shortness of breath or dysuria.  Patient was noted to have significant swelling tenderness and redness of the right elbow.  She also had fever in the ER.  Additionally has acute on chronic kidney disease with possible UTI.  She has been admitted to the medical service for treatment.  She denied any other injury.  Denied any nausea vomiting or diarrhea..  ED Course: Temperature is 101.6 blood pressure 101/57, pulse 110 respiratory 24 oxygen sat 82% on room air currently at 100% on 2 L.  White count is 11,000 hemoglobin 9.2 and platelets of 113. Creatinine is 3.47.  Previous creatinine was 2.4.  Urinalysis showed poor collection but few bacteria.  RBC 21-50 squamous 11-20 but WBC also 21-50. X-ray of the right elbow yesterday showed possible effusion.  **Interim History IV fluids have been continued and creatinine is trending down.  Orthopedic surgery has been consulted and they will see the patient in the a.m.  We will continue to monitor patient as she has had difficulty voiding and has had required and out caths and may require a Foley  catheter.  Assessment & Plan:   Principal Problem:   Acute renal failure (ARF) (HCC) Active Problems:   Hyperlipidemia   Essential hypertension   UTI   Hyponatremia   History of MI (myocardial infarction)   CKD (chronic kidney disease), stage III (HCC)   Type 2 diabetes mellitus with hyperglycemia, with long-term current use of insulin (HCC)   Sleep apnea  Acute on chronic kidney injury Stage 3 Metabolic acidosis -Patient has possible prerenal causes.   -With her past history of chronic kidney disease as well as history of falls we will get renal ultrasound to rule out obstructive causes.   -In the meantime hydrate the patient and monitor renal function. -Renal ultrasound showed no hydronephrosis but patient was having significant bladder retention -Continue bladder scans and in and out cath and if continues to drain will place a Foley catheter -BUN/creatinine is trending down with IV fluid hydration and 100 and mils per hour -Follow-up on urine culture -Strict I's and O's and daily weights  Acute urinary retention -Continue with INO cath per hospital policy -If patient continues to not void will place Foley catheter  Acute respiratory failure with hypoxia -Unclear etiology at this point -Chest x-ray showed no active disease -Add flutter valve, incentive spirometry, guaifenesin 1200 g p.o. twice daily -Add duo nebs scheduled -Continue monitor and repeat chest x-ray in a.m.  Right elbow swelling and tenderness with concern for cellulitis -Elbow is warm to touch swollen and tender.   -Appears to have some effusion in there.   -Suspect posttraumatic versus  septic elbow.   -WBC trended down from 11,000 now 10,000 -Continue to treat with Vanco and Levaquin pending culture results. -Orthopedic consult placed and Dr. Nigel Mormon to evaluate in a.m. and patient is to be n.p.o. in a.m. in case surgical intervention is necessary  Diabetes Insulin Dependent  -Continue home regimen  with Lantus with  Moderate Novolog sliding scale insulin. -Blood sugars are currently uncontrolled and will need further adjustments -Consider Consulting Diabetes Education Coordinator   Hypertension -Blood pressure is elevated.  Resume home regimen and monitor closely and adjust as necessary.  Coronary artery disease -Stable appears to be at baseline.  Suspected UTI -Sample appears contaminated.   -May recollect urine.  -As above.  WBC trended down from 11,000 and is now 10,000 -Will empirically be on treatment. -Follow Cultures  Normocytic anemia/anemia of chronic kidney disease -BUN/creatinine is currently stable at 9.3/20.5 -Check anemia panel in a.m. -Continue monitor for signs and symptoms of bleeding; no overt bleeding noted currently -Repeat CBC in a.m.  Hyponatremia -It appears to be chronic -Improving with IV fluid resuscitation -Continue to monitor and trend and now sodium levels 129  Elevated troponin -In setting of renal failure -Currently denies any chest pain -Continue monitor and trend  Obesity -Estimated body mass index is 36.41 kg/m as calculated from the following:   Height as of this encounter: 5' 2"  (1.575 m).   Weight as of this encounter: 90.3 kg. -Weight Loss and Dietary Counseling given   Hyperlipidemia -Continue atorvastatin 10 mg p.o. nightly  DVT prophylaxis: Heparin 5000 units subcu every 8 Code Status: FULL CODE Family Communication: No family present at bedside  Disposition Plan: Pending further workup and treatment  Consultants:   Orthopedic Surgery    Procedures: Renal U/S  Antimicrobials:  Anti-infectives (From admission, onward)   Start     Dose/Rate Route Frequency Ordered Stop   04/17/19 2330  vancomycin (VANCOCIN) IVPB 750 mg/150 ml premix     750 mg 150 mL/hr over 60 Minutes Intravenous Every 48 hours 04/15/19 2329     04/17/19 2200  levofloxacin (LEVAQUIN) tablet 500 mg     500 mg Oral Every 48 hours 04/15/19  2252     04/16/19 0000  vancomycin (VANCOCIN) 1,750 mg in sodium chloride 0.9 % 500 mL IVPB     1,750 mg 250 mL/hr over 120 Minutes Intravenous  Once 04/15/19 2319 04/16/19 0220   04/15/19 2200  levofloxacin (LEVAQUIN) IVPB 750 mg     750 mg 100 mL/hr over 90 Minutes Intravenous  Once 04/15/19 2142 04/15/19 2352     Subjective: Seen and examined at bedside and was complaining of significant pain.  She was drowsy is wanting but more alert later on.  No chest pain, lightheadedness or dizziness but desaturated when oxygen was attempted to be weaned.  Patient currently retaining urine.  Wearing a boot on her left foot.  No other concerns or complaints at this time  Objective: Vitals:   04/15/19 2058 04/15/19 2255 04/16/19 0143 04/16/19 0528  BP: 140/61 126/70 122/62 138/70  Pulse: (!) 110 (!) 102 99 100  Resp: (!) 24 16 20 20   Temp: (!) 100.9 F (38.3 C) 100.3 F (37.9 C) 99.8 F (37.7 C) 100.3 F (37.9 C)  TempSrc: Oral Oral Oral Oral  SpO2: 95% 96% 96% 95%  Weight: 91.5 kg   90.3 kg  Height: 5' 2"  (1.575 m)       Intake/Output Summary (Last 24 hours) at 04/16/2019 1610 Last data filed at  04/16/2019 0600 Gross per 24 hour  Intake 2780.1 ml  Output --  Net 2780.1 ml   Filed Weights   04/15/19 1013 04/15/19 2058 04/16/19 0528  Weight: 77.1 kg 91.5 kg 90.3 kg   Examination: Physical Exam:  Constitutional: WN/WD obese Caucasian female currently NAD and appears anxious and uncomfortable Eyes: Lids and conjunctivae normal, sclerae anicteric  ENMT: External Ears, Nose appear normal. Grossly normal hearing.  Neck: Appears normal, supple, no cervical masses, normal ROM, no appreciable thyromegaly; no JVD Respiratory: Diminished to auscultation bilaterally, no wheezing, rales, rhonchi or crackles. Normal respiratory effort and patient is not tachypenic. No accessory muscle use.  Cardiovascular: Tachycardic Rate, no murmurs / rubs / gallops. S1 and S2 auscultated. Trace extremity  edema.  Abdomen: Soft, non-tender, Distended. No masses palpated. No appreciable hepatosplenomegaly. Bowel sounds positive x4.  GU: Deferred. Musculoskeletal: Part of Right Foot Amputated; Right Elbow swollen and erythematous and warm  Skin: No rashes noted . No induration; Warm and dry.  Neurologic: CN 2-12 grossly intact with no focal deficits. Romberg sign and cerebellar reflexes not assessed.  Psychiatric: Normal judgment and insight. Was a little drowsy but and oriented x 3. Anxious mood and appropriate affect.   Data Reviewed: I have personally reviewed following labs and imaging studies  CBC: Recent Labs  Lab 04/15/19 1113 04/15/19 2152  WBC 9.9 11.0*  HGB 9.3* 9.2*  HCT 28.8* 28.9*  MCV 84.0 85.8  PLT 112* 778*   Basic Metabolic Panel: Recent Labs  Lab 04/15/19 1113 04/15/19 2152  NA 122*  --   K 4.7  --   CL 90*  --   CO2 21*  --   GLUCOSE 333*  --   BUN 69*  --   CREATININE 3.47* 3.06*  CALCIUM 8.0*  --    GFR: Estimated Creatinine Clearance: 19.7 mL/min (A) (by C-G formula based on SCr of 3.06 mg/dL (H)). Liver Function Tests: Recent Labs  Lab 04/15/19 1113  AST 23  ALT 20  ALKPHOS 73  BILITOT 1.1  PROT 7.2  ALBUMIN 3.0*   No results for input(s): LIPASE, AMYLASE in the last 168 hours. No results for input(s): AMMONIA in the last 168 hours. Coagulation Profile: No results for input(s): INR, PROTIME in the last 168 hours. Cardiac Enzymes: Recent Labs  Lab 04/15/19 1113 04/15/19 1603  TROPONINI 0.10* 0.15*   BNP (last 3 results) No results for input(s): PROBNP in the last 8760 hours. HbA1C: No results for input(s): HGBA1C in the last 72 hours. CBG: Recent Labs  Lab 04/15/19 2002  GLUCAP 297*   Lipid Profile: No results for input(s): CHOL, HDL, LDLCALC, TRIG, CHOLHDL, LDLDIRECT in the last 72 hours. Thyroid Function Tests: No results for input(s): TSH, T4TOTAL, FREET4, T3FREE, THYROIDAB in the last 72 hours. Anemia Panel: No results  for input(s): VITAMINB12, FOLATE, FERRITIN, TIBC, IRON, RETICCTPCT in the last 72 hours. Sepsis Labs: No results for input(s): PROCALCITON, LATICACIDVEN in the last 168 hours.  Recent Results (from the past 240 hour(s))  SARS Coronavirus 2 (Hosp order,Performed in [facility] lab via [employer] ID)     Status: None   Collection Time: 04/15/19 11:14 AM   Specimen: Dry Nasal Swab ([employer] ID Now)  Result Value Ref Range Status   SARS Coronavirus 2 ([employer] ID Now) NEGATIVE NEGATIVE Final    Comment: (NOTE) Interpretive Result Comment(s): COVID 19 Positive SARS CoV 2 target nucleic acids are DETECTED. The SARS CoV 2 RNA is generally detectable in upper and lower  respiratory specimens during the acute phase of infection.  Positive results are indicative of active infection with SARS CoV 2.  Clinical correlation with patient history and other diagnostic information is necessary to determine patient infection status.  Positive results do not rule out bacterial infection or coinfection with other viruses. The expected result is Negative. COVID 19 Negative SARS CoV 2 target nucleic acids are NOT DETECTED. The SARS CoV 2 RNA is generally detectable in upper and lower respiratory specimens during the acute phase of infection.  Negative results do not preclude SARS CoV 2 infection, do not rule out coinfections with other pathogens, and should not be used as the sole basis for treatment or other patient management decisions.  Negative results must be combined with clinical  observations, patient history, and epidemiological information. The expected result is Negative. Invalid Presence or absence of SARS CoV 2 nucleic acids cannot be determined. Repeat testing was performed on the submitted specimen and repeated Invalid results were obtained.  If clinically indicated, additional testing on a new specimen with an alternate test methodology [phone]) is advised.  The SARS CoV 2 RNA is generally  detectable in upper and lower respiratory specimens during the acute phase of infection. The expected result is Negative. Fact Sheet for Patients:  GolfingFamily.no Fact Sheet for Healthcare Providers: https://www.hernandez-brewer.com/ This test is not yet approved or cleared by the [place] FDA and has been authorized for detection and/or diagnosis of SARS CoV 2 by FDA under an Emergency Use Authorization (EUA).  This EUA will remain in effect (meaning this test can be used) for the duration of the COVID19 d eclaration under Section 564(b)(1) of the Act, 21 U.S.C. section [phone] 3(b)(1), unless the authorization is terminated or revoked sooner. Performed at [facility], [address] [place], [place] [address]     Radiology Studies: Dg Elbow 2 Views Right  Result Date: 04/14/2019 CLINICAL DATA:  Right upper extremity pain post fall. EXAM: RIGHT ELBOW - 2 VIEW COMPARISON:  None. FINDINGS: There is no evidence of acute fracture, or dislocation. Extensive posttraumatic/postsurgical changes in the right elbow with absence of the radial head and exuberant enthesophytes formation. Elbow effusion cannot be excluded. IMPRESSION: 1. No acute fracture or dislocation identified about the right elbow. 2. Extensive posttraumatic/postsurgical changes in the right elbow. 3. Right elbow effusion cannot be excluded. Electronically Signed   By: Fidela Salisbury M.D.   On: 04/14/2019 14:38   Dg Forearm Right  Result Date: 04/14/2019 CLINICAL DATA:  Right upper arm pain since a fall 2 days ago. Initial encounter. EXAM: RIGHT FOREARM - 2 VIEW COMPARISON:  Plain films right elbow 03/25/2015. FINDINGS: No acute bony or joint abnormality is identified. 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 There is advanced for age degenerative change about the elbow. IMPRESSION: No acute abnormality. 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 Electronically Signed   By: Inge Rise M.D.   On: 04/14/2019 14:33   Dg Wrist Complete Right  Result Date: 04/14/2019 CLINICAL DATA:  Right wrist pain since a fall 2 days ago. Initial encounter. EXAM: RIGHT WRIST - COMPLETE 3+ VIEW COMPARISON:  None. FINDINGS: There is no evidence of fracture or dislocation. Scattered mild degenerative disease about the wrist is noted. Soft tissues are unremarkable. IMPRESSION: No acute abnormality. Electronically Signed   By: Inge Rise M.D.   On: 04/14/2019 14:33  Dg Chest Port 1 View  Result Date: 04/15/2019 CLINICAL DATA:  Chest pain.  Hypoxia. EXAM: PORTABLE CHEST 1 VIEW COMPARISON:  Chest CT dated 12/03/2018 and chest x-ray dated 11/26/2018 FINDINGS: Heart size and pulmonary vascularity are normal. Lungs are clear. Prominent left pericardial fat pad as demonstrated on the prior CT scan. Old healed fracture of the left clavicle. No acute bone abnormality. IMPRESSION: No acute abnormalities. Electronically Signed   By: Lorriane Shire M.D.   On: 04/15/2019 12:25   Dg Humerus Right  Result Date: 04/14/2019 CLINICAL DATA:  Right arm pain since a fall 2 days ago. Initial encounter. EXAM: RIGHT HUMERUS - 2+ VIEW COMPARISON:  Plain films right elbow 03/25/2015. FINDINGS: No acute bony or joint abnormality is identified. Posttraumatic change about the right elbow is identified as seen on the prior exam. IMPRESSION: No acute finding. Electronically Signed   By: Inge Rise M.D.   On: 04/14/2019 14:34   Ct Extrem Up Entire Arm R Wo/cm  Result Date: 04/15/2019 CLINICAL DATA:  [place] 3 days ago.  Persistent arm pain. EXAM: CT OF THE UPPER RIGHT EXTREMITY WITHOUT CONTRAST TECHNIQUE: Multidetector CT imaging of the upper right extremity was performed according to the standard protocol. COMPARISON:  Radiographs 04/14/2019 FINDINGS: The shoulder joint is maintained. No acute fracture  of the humeral head or neck is identified. The humeral shaft is intact. No fracture. Severe degenerative changes are noted at the elbow joint with a remote ununited displaced fracture through the radial neck. No definite acute elbow fracture. Significant breathing motion artifact is noted but I do not see any significant pulmonary abnormality other than patchy areas of atelectasis. The visualized ribs appear intact. No obvious rib fractures. IMPRESSION: 1. No acute fracture of the humerus is identified. 2. 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 Associated posttraumatic degenerative changes at the joint. Electronically Signed   By: Marijo Sanes M.D.   On: 04/15/2019 14:55   Scheduled Meds:  atorvastatin  10 mg Oral q1800   calcium-vitamin D  1 tablet Oral Daily   heparin  5,000 Units Subcutaneous Q8H   [START ON 04/17/2019] levofloxacin  500 mg Oral Q48H   Continuous Infusions:  sodium chloride 100 mL/hr at 04/15/19 2156   [START ON 04/17/2019] vancomycin       LOS: 1 day   Kerney Elbe, DO [facility] PAGER is on AMION  If 7PM-7AM, please contact night-coverage [email or web address] Password [facility] 04/16/2019, 8:03 AM

## 2019-04-16 NOTE — Progress Notes (Addendum)
Patient up to [facility] to void with 2 assist.  Patient very unsteady.  Patient was able to void 500 cc independently after sitting up for several minutes.  Patient bathed and assisted back to bed.  Bladder scan done and still shows 522 cc.  I&O cath performed  - 1000 cc drained from bladder.  Patient states she feels that her bladder no longer feels full.  MD paged and made aware.  Patient has been more alert since mid-morning.  Answers orientation questions correctly, but seems to ramble onto various conversations when conversing.  Hard to follow.  Still rates pain 10/10.  Patient also does not use O2 at home, attempted to take O2 off while patient up to [facility].  Sats dropped to 72% on RA.  2L O2 placed back on patient in bed, sats increased to 93%  Patient has boot and dressing to L foot,  Patient unable to explain weather its a wound or something surgical - will not allow me to remove dressing at this time.  States that they change the dressing once a week on fridays.  Will notify MD
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
## 2019-04-16 NOTE — Progress Notes (Signed)
Inpatient Diabetes Program Recommendations  AACE/ADA: New Consensus Statement on Inpatient Glycemic Control (2015)  Target Ranges:  Prepandial:   less than 140 mg/dL      Peak postprandial:   less than 180 mg/dL (1-2 hours)      Critically ill patients:  140 - 180 mg/dL   Lab Results  Component Value Date   GLUCAP 280 (H) 04/16/2019   HGBA1C 10.3 (H) 11/26/2018    Review of Glycemic Control  Diabetes history: DM2 Outpatient Diabetes medications: Lantus 20 in am and 40 units QHS, Humalog 13 units tidwc Current orders for Inpatient glycemic control: None  Need updated HgbA1C.  Inpatient Diabetes Program Recommendations:     Lantus 20 units QHS Novolog 0-15 units tidwc  Novolog 4 units tidwc for meal coverage insulin if pt eats > 50% meal HgbA1C to assess glycemic control PTA  Will follow.  Thank you. Lorenda Peck, RD, LDN, CDE Inpatient Diabetes Coordinator [phone]

## 2019-04-17 ENCOUNTER — Inpatient Hospital Stay (HOSPITAL_COMMUNITY): Payer: PRIVATE HEALTH INSURANCE | Admitting: Anesthesiology

## 2019-04-17 ENCOUNTER — Inpatient Hospital Stay (HOSPITAL_COMMUNITY): Payer: PRIVATE HEALTH INSURANCE

## 2019-04-17 ENCOUNTER — Encounter (HOSPITAL_COMMUNITY)
Admission: EM | Disposition: A | Discharge status: Home / Self Care | Payer: Self-pay | Source: Home / Self Care | Attending: Internal Medicine

## 2019-04-17 ENCOUNTER — Encounter (HOSPITAL_COMMUNITY): Payer: Self-pay | Admitting: Certified Registered Nurse Anesthetist

## 2019-04-17 DIAGNOSIS — Z881 Allergy status to other antibiotic agents status: Secondary | ICD-10-CM

## 2019-04-17 DIAGNOSIS — Z91018 Allergy to other foods: Secondary | ICD-10-CM

## 2019-04-17 DIAGNOSIS — L97429 Non-pressure chronic ulcer of left heel and midfoot with unspecified severity: Secondary | ICD-10-CM

## 2019-04-17 DIAGNOSIS — Z940 Kidney transplant status: Secondary | ICD-10-CM

## 2019-04-17 DIAGNOSIS — Z91013 Allergy to seafood: Secondary | ICD-10-CM

## 2019-04-17 DIAGNOSIS — Z888 Allergy status to other drugs, medicaments and biological substances status: Secondary | ICD-10-CM

## 2019-04-17 DIAGNOSIS — Z8631 Personal history of diabetic foot ulcer: Secondary | ICD-10-CM

## 2019-04-17 DIAGNOSIS — Z880 Allergy status to penicillin: Secondary | ICD-10-CM

## 2019-04-17 DIAGNOSIS — H3500 Unspecified background retinopathy: Secondary | ICD-10-CM

## 2019-04-17 DIAGNOSIS — L0390 Cellulitis, unspecified: Secondary | ICD-10-CM

## 2019-04-17 DIAGNOSIS — Z91030 Bee allergy status: Secondary | ICD-10-CM

## 2019-04-17 DIAGNOSIS — M009 Pyogenic arthritis, unspecified: Secondary | ICD-10-CM | POA: Diagnosis present

## 2019-04-17 DIAGNOSIS — S52121K Displaced fracture of head of right radius, subsequent encounter for closed fracture with nonunion: Secondary | ICD-10-CM

## 2019-04-17 DIAGNOSIS — Z886 Allergy status to analgesic agent status: Secondary | ICD-10-CM

## 2019-04-17 DIAGNOSIS — L8990 Pressure ulcer of unspecified site, unspecified stage: Secondary | ICD-10-CM | POA: Diagnosis present

## 2019-04-17 DIAGNOSIS — E11621 Type 2 diabetes mellitus with foot ulcer: Secondary | ICD-10-CM

## 2019-04-17 DIAGNOSIS — W19XXXD Unspecified fall, subsequent encounter: Secondary | ICD-10-CM

## 2019-04-17 HISTORY — PX: INCISION AND DRAINAGE: SHX5863

## 2019-04-17 HISTORY — DX: Kidney transplant status: Z94.0

## 2019-04-17 HISTORY — DX: Unspecified background retinopathy: H35.00

## 2019-04-17 LAB — CBC WITH DIFFERENTIAL/PLATELET
Abs Immature Granulocytes: 0.09 10*3/uL — ABNORMAL HIGH (ref 0.00–0.07)
Basophils Absolute: 0 10*3/uL (ref 0.0–0.1)
Basophils Relative: 0 %
Eosinophils Absolute: 0 10*3/uL (ref 0.0–0.5)
Eosinophils Relative: 0 %
HCT: 28.8 % — ABNORMAL LOW (ref 36.0–46.0)
Hemoglobin: 9 g/dL — ABNORMAL LOW (ref 12.0–15.0)
Immature Granulocytes: 1 %
Lymphocytes Relative: 5 %
Lymphs Abs: 0.4 10*3/uL — ABNORMAL LOW (ref 0.7–4.0)
MCH: 27 pg (ref 26.0–34.0)
MCHC: 31.3 g/dL (ref 30.0–36.0)
MCV: 86.5 fL (ref 80.0–100.0)
Monocytes Absolute: 0.7 10*3/uL (ref 0.1–1.0)
Monocytes Relative: 9 %
Neutro Abs: 6.6 10*3/uL (ref 1.7–7.7)
Neutrophils Relative %: 85 %
Platelets: 122 10*3/uL — ABNORMAL LOW (ref 150–400)
RBC: 3.33 MIL/uL — ABNORMAL LOW (ref 3.87–5.11)
RDW: 15.1 % (ref 11.5–15.5)
WBC: 7.8 10*3/uL (ref 4.0–10.5)
nRBC: 0.3 % — ABNORMAL HIGH (ref 0.0–0.2)

## 2019-04-17 LAB — GLUCOSE, CAPILLARY
Glucose-Capillary: 239 mg/dL — ABNORMAL HIGH (ref 70–99)
Glucose-Capillary: 264 mg/dL — ABNORMAL HIGH (ref 70–99)
Glucose-Capillary: 260 mg/dL — ABNORMAL HIGH (ref 70–99)
Glucose-Capillary: 201 mg/dL — ABNORMAL HIGH (ref 70–99)
Glucose-Capillary: 165 mg/dL — ABNORMAL HIGH (ref 70–99)

## 2019-04-17 LAB — TROPONIN I
Troponin I: 0.09 ng/mL (ref <0.03)
Troponin I: 0.07 ng/mL (ref <0.03)

## 2019-04-17 LAB — COMPREHENSIVE METABOLIC PANEL
ALT: 24 U/L (ref 0–44)
AST: 34 U/L (ref 15–41)
Albumin: 2.4 g/dL — ABNORMAL LOW (ref 3.5–5.0)
Alkaline Phosphatase: 78 U/L (ref 38–126)
Anion gap: 11 (ref 5–15)
BUN: 62 mg/dL — ABNORMAL HIGH (ref 8–23)
CO2: 19 mmol/L — ABNORMAL LOW (ref 22–32)
Calcium: 7.9 mg/dL — ABNORMAL LOW (ref 8.9–10.3)
Chloride: 98 mmol/L (ref 98–111)
Creatinine, Ser: 2.27 mg/dL — ABNORMAL HIGH (ref 0.44–1.00)
GFR calc Af Amer: 26 mL/min — ABNORMAL LOW (ref >60)
GFR calc non Af Amer: 22 mL/min — ABNORMAL LOW (ref >60)
Glucose, Bld: 307 mg/dL — ABNORMAL HIGH (ref 70–99)
Potassium: 4.3 mmol/L (ref 3.5–5.1)
Sodium: 128 mmol/L — ABNORMAL LOW (ref 135–145)
Total Bilirubin: 0.8 mg/dL (ref 0.3–1.2)
Total Protein: 6.3 g/dL — ABNORMAL LOW (ref 6.5–8.1)

## 2019-04-17 LAB — HEMOGLOBIN A1C
Hgb A1c MFr Bld: 12.2 % — ABNORMAL HIGH (ref 4.8–5.6)
Mean Plasma Glucose: 303.44 mg/dL

## 2019-04-17 LAB — PREALBUMIN: Prealbumin: <5 mg/dL — ABNORMAL LOW (ref 18–38)

## 2019-04-17 LAB — PHOSPHORUS: Phosphorus: 2.5 mg/dL (ref 2.5–4.6)

## 2019-04-17 LAB — MAGNESIUM: Magnesium: 1.9 mg/dL (ref 1.7–2.4)

## 2019-04-17 SURGERY — INCISION AND DRAINAGE
Anesthesia: General | Laterality: Right

## 2019-04-17 MED ORDER — ONDANSETRON HCL 4 MG PO TABS: 4.0000 mg | ORAL_TABLET | Freq: Four times a day (QID) | ORAL | Status: DC | PRN

## 2019-04-17 MED ORDER — DOCUSATE SODIUM 100 MG PO CAPS
100.0000 mg | ORAL_CAPSULE | Freq: Two times a day (BID) | ORAL | Status: DC
  Administered 2019-04-17 – 2019-04-21 (×9): 100 mg via ORAL
  Filled 2019-04-17 (×9): qty 1

## 2019-04-17 MED ORDER — INSULIN ASPART 100 UNIT/ML ~~LOC~~ SOLN
4.0000 [IU] | Freq: Three times a day (TID) | SUBCUTANEOUS | Status: DC
  Administered 2019-04-18 – 2019-04-20 (×5): 4 [IU] via SUBCUTANEOUS

## 2019-04-17 MED ORDER — FENTANYL CITRATE (PF) 100 MCG/2ML IJ SOLN
INTRAMUSCULAR | Status: AC
  Filled 2019-04-17: qty 2

## 2019-04-17 MED ORDER — HYDROMORPHONE HCL 1 MG/ML IJ SOLN
INTRAMUSCULAR | Status: AC
  Filled 2019-04-17: qty 1

## 2019-04-17 MED ORDER — LACTATED RINGERS IV SOLN
INTRAVENOUS | Status: DC | PRN
  Administered 2019-04-17: 06:00:00 via INTRAVENOUS

## 2019-04-17 MED ORDER — PHENYLEPHRINE 40 MCG/ML (10ML) SYRINGE FOR IV PUSH (FOR BLOOD PRESSURE SUPPORT)
PREFILLED_SYRINGE | INTRAVENOUS | Status: DC | PRN
  Administered 2019-04-17: 80 ug via INTRAVENOUS

## 2019-04-17 MED ORDER — FENTANYL CITRATE (PF) 100 MCG/2ML IJ SOLN
INTRAMUSCULAR | Status: DC | PRN
  Administered 2019-04-17: 50 ug via INTRAVENOUS

## 2019-04-17 MED ORDER — HYDROMORPHONE HCL 1 MG/ML IJ SOLN
0.2500 mg | INTRAMUSCULAR | Status: DC | PRN
  Administered 2019-04-17: 0.5 mg via INTRAVENOUS

## 2019-04-17 MED ORDER — ONDANSETRON HCL 4 MG/2ML IJ SOLN
INTRAMUSCULAR | Status: DC | PRN
  Administered 2019-04-17: 4 mg via INTRAVENOUS

## 2019-04-17 MED ORDER — MIDAZOLAM HCL 5 MG/5ML IJ SOLN
INTRAMUSCULAR | Status: DC | PRN
  Administered 2019-04-17: 2 mg via INTRAVENOUS

## 2019-04-17 MED ORDER — SUCCINYLCHOLINE CHLORIDE 20 MG/ML IJ SOLN
INTRAMUSCULAR | Status: DC | PRN
  Administered 2019-04-17: 120 mg via INTRAVENOUS

## 2019-04-17 MED ORDER — METOCLOPRAMIDE HCL 5 MG/ML IJ SOLN: 5.0000 mg | Freq: Three times a day (TID) | INTRAMUSCULAR | Status: DC | PRN

## 2019-04-17 MED ORDER — ASPIRIN EC 81 MG PO TBEC
81.0000 mg | DELAYED_RELEASE_TABLET | Freq: Two times a day (BID) | ORAL | Status: DC
  Administered 2019-04-17 – 2019-04-21 (×9): 81 mg via ORAL
  Filled 2019-04-17 (×9): qty 1

## 2019-04-17 MED ORDER — LIDOCAINE 2% (20 MG/ML) 5 ML SYRINGE
INTRAMUSCULAR | Status: DC | PRN
  Administered 2019-04-17: 60 mg via INTRAVENOUS

## 2019-04-17 MED ORDER — SODIUM CHLORIDE 0.9 % IR SOLN
Status: DC | PRN
  Administered 2019-04-17: 3000 mL

## 2019-04-17 MED ORDER — MIDAZOLAM HCL 2 MG/2ML IJ SOLN
INTRAMUSCULAR | Status: AC
  Filled 2019-04-17: qty 2

## 2019-04-17 MED ORDER — 0.9 % SODIUM CHLORIDE (POUR BTL) OPTIME
TOPICAL | Status: DC | PRN
  Administered 2019-04-17: 07:00:00 1000 mL

## 2019-04-17 MED ORDER — ONDANSETRON HCL 4 MG/2ML IJ SOLN
INTRAMUSCULAR | Status: AC
  Filled 2019-04-17: qty 2

## 2019-04-17 MED ORDER — INSULIN ASPART 100 UNIT/ML ~~LOC~~ SOLN
SUBCUTANEOUS | Status: AC
  Filled 2019-04-17: qty 1

## 2019-04-17 MED ORDER — PHENYLEPHRINE 40 MCG/ML (10ML) SYRINGE FOR IV PUSH (FOR BLOOD PRESSURE SUPPORT)
PREFILLED_SYRINGE | INTRAVENOUS | Status: AC
  Filled 2019-04-17: qty 10

## 2019-04-17 MED ORDER — PROPOFOL 10 MG/ML IV BOLUS
INTRAVENOUS | Status: DC | PRN
  Administered 2019-04-17: 160 mg via INTRAVENOUS

## 2019-04-17 MED ORDER — PROPOFOL 10 MG/ML IV BOLUS
INTRAVENOUS | Status: AC
  Filled 2019-04-17: qty 20

## 2019-04-17 MED ORDER — ONDANSETRON HCL 4 MG/2ML IJ SOLN
4.0000 mg | Freq: Four times a day (QID) | INTRAMUSCULAR | Status: DC | PRN
  Administered 2019-04-21: 4 mg via INTRAVENOUS
  Filled 2019-04-17: qty 2

## 2019-04-17 MED ORDER — METOCLOPRAMIDE HCL 5 MG PO TABS: 5.0000 mg | ORAL_TABLET | Freq: Three times a day (TID) | ORAL | Status: DC | PRN

## 2019-04-17 SURGICAL SUPPLY — 21 items
BANDAGE ACE 6X5 VEL STRL LF (GAUZE/BANDAGES/DRESSINGS) ×2 IMPLANT
BNDG ESMARK 4X9 LF (GAUZE/BANDAGES/DRESSINGS) ×2 IMPLANT
BNDG GAUZE ELAST 4 BULKY (GAUZE/BANDAGES/DRESSINGS) ×2 IMPLANT
COVER MAYO STAND STRL (DRAPES) ×2 IMPLANT
CUFF TOURN SGL QUICK 18X4 (TOURNIQUET CUFF) ×2 IMPLANT
DRAPE ORTHO SPLIT 77X108 STRL (DRAPES) ×2
DRAPE SURG ORHT 6 SPLT 77X108 (DRAPES) IMPLANT
DRSG PAD ABDOMINAL 8X10 ST (GAUZE/BANDAGES/DRESSINGS) ×2 IMPLANT
EVACUATOR 1/8 PVC DRAIN (DRAIN) ×2 IMPLANT
GAUZE SPONGE 4X4 12PLY STRL (GAUZE/BANDAGES/DRESSINGS) ×2 IMPLANT
GAUZE XEROFORM 5X9 LF (GAUZE/BANDAGES/DRESSINGS) ×2 IMPLANT
GLOVE BIO SURGEON STRL SZ7 (GLOVE) ×2 IMPLANT
GLOVE BIOGEL PI IND STRL 7.0 (GLOVE) IMPLANT
GLOVE BIOGEL PI INDICATOR 7.0 (GLOVE) ×2
PACK ORTHO EXTREMITY (CUSTOM PROCEDURE TRAY) ×2 IMPLANT
SET IRRIG Y TYPE TUR BLADDER L (SET/KITS/TRAYS/PACK) ×2 IMPLANT
STOCKINETTE 8 INCH (MISCELLANEOUS) ×2 IMPLANT
SUT ETHILON 2 0 PS N (SUTURE) ×4 IMPLANT
SUT PDS AB 0 CT1 36 (SUTURE) ×2 IMPLANT
SUT PROLENE 3 0 PS 2 (SUTURE) ×2 IMPLANT
YANKAUER SUCT BULB TIP 10FT TU (MISCELLANEOUS) ×2 IMPLANT

## 2019-04-17 NOTE — Anesthesia Postprocedure Evaluation (Signed)
Anesthesia Post Note  Patient: Paulita Licklider  Procedure(s) Performed: INCISION AND DRAINAGE Right arm (Right )     Patient location during evaluation: PACU Anesthesia Type: General Level of consciousness: awake and alert Pain management: pain level controlled Vital Signs Assessment: post-procedure vital signs reviewed and stable Respiratory status: spontaneous breathing, nonlabored ventilation, respiratory function stable and patient connected to nasal cannula oxygen Cardiovascular status: blood pressure returned to baseline and stable Postop Assessment: no apparent nausea or vomiting Anesthetic complications: no    Last Vitals:  Vitals:   04/17/19 0815 04/17/19 0849  BP: 132/73 116/60  Pulse: 91 74  Resp: 13 16  Temp: 36.7 C 36.6 C  SpO2: 94% 93%    Last Pain:  Vitals:   04/17/19 0849  TempSrc: Oral  PainSc:                  Effie Berkshire

## 2019-04-17 NOTE — Progress Notes (Signed)
Pt voided 100 ml on BSC. Bladder scan showed 533. I&O cath produced 600 ml. Pt states bladder feels empty.   Transported pt to PACU @ 0550.

## 2019-04-17 NOTE — Discharge Instructions (Signed)
Discharge Instructions after Open Elbow Surgery   A sling may be provided for you. If so, remain in your sling at all times. This includes sleeping in the sling.  Use ice on the elbow intermittently over the first 48 hours after surgery. Pain medicine has been prescribed for you.  Use your medicine liberally over the first 48 hours, and then you can begin to taper your use. You may take Extra Strength Tylenol or Tylenol only in place of the pain pills.  Leave the dressing in place until your follow-up appointment  You may shower after surgery. The dressing CANNOT get wet during your shower. Wrap the dressing with a dry towel and place your arm in a large trash bag.  Take one aspirin a day for 2 weeks after surgery, unless you have an aspirin sensitivity/ allergy or asthma.  You may receive Indocin (Indomethacin) to take after surgery. If so, take as per the prescription. You MUST take this medication on a full stomach. If you begin to experience stomach pain, discontinue the Indocin. Otherwise, continue to take it regardless of how your elbow feels. While taking Indocin DO NOT take ANY nonsteroidal anti-inflammatory pain medications: Advil, Motrin, Ibuprofen, Aleve, Naproxen, or Narprosyn.   Please call [phone] during normal business hours or [phone] after hours for any problems. Including the following:  - excessive redness of the incisions - drainage for more than 4 days - fever of more than 101.5 F  *Please note that pain medications will not be refilled after hours or on weekends.

## 2019-04-17 NOTE — Interval H&P Note (Signed)
History and Physical Interval Note:  04/17/2019 6:01 AM  [name] [name]  has presented today for surgery, with the diagnosis of SEPTIC RIGHT ELBOW.  The various methods of treatment have been discussed with the patient and family. After consideration of risks, benefits and other options for treatment, the patient has consented to  Procedure(s): INCISION AND DRAINAGE (Right) as a surgical intervention.  The patient's history has been reviewed, patient examined, no change in status, stable for surgery.  I have reviewed the patient's chart and labs.  Questions were answered to the patient's satisfaction.     [name]

## 2019-04-17 NOTE — Progress Notes (Signed)
VASCULAR LAB PRELIMINARY  PRELIMINARY  PRELIMINARY  PRELIMINARY  ABIs completed.    Preliminary report:  See CV proc for preliminary report.   Adylene Dlugosz, RVT 04/17/2019, 3:49 PM

## 2019-04-17 NOTE — Consult Note (Signed)
PV Navigator Consult acknowledged. Chart reviewed. Working remotely.  63y/o female admitted w/sepsis R elbow resulting in an I&D of R arm today with Dr [name]. Attempted to reach patient via telephone, however voice mailbox was full. Was able to reach her husband and speak with him via telephone. He reports that pt began having pain and swelling to R elbow after a fall at home. Medical history to include MI (2007), CAD, HTN, DM with foot ulcer, CKD and chronic pain syndome. Husband reports patient has been going to the [facility] wound clinic weekly for wound care to L heel diabetic ulcer with improvements noted . Reports they have a standing appointment with [facility] wound clinic every Friday. He reports that he performs wound care at home all other days without problems. Intentions are for patient to return home and continue [facility] wound clinic appointments after discharge. ABI results from 04/09/2019 R=1.1, L=1.1 Noted WOC, SW, CM, Nutrition and Diabetes coordinator consults in place. Left husband PV Navigator contact information should questions/barriers arise. Will follow during hospitalization. Thank you for this consult. Cletis Media RN BSN CWS [place] [phone]

## 2019-04-17 NOTE — Anesthesia Procedure Notes (Signed)
Procedure Name: Intubation Performed by: Gean Maidens, CRNA Pre-anesthesia Checklist: Patient identified, Emergency Drugs available, Suction available, Patient being monitored and Timeout performed Patient Re-evaluated:Patient Re-evaluated prior to induction Oxygen Delivery Method: Circle system utilized Preoxygenation: Pre-oxygenation with 100% oxygen Induction Type: IV induction and Rapid sequence Laryngoscope Size: Mac and 4 Grade View: Grade I Tube type: Oral Tube size: 7.0 mm Number of attempts: 1 Airway Equipment and Method: Stylet Placement Confirmation: ETT inserted through vocal cords under direct vision and breath sounds checked- equal and bilateral Secured at: 21 cm Tube secured with: Tape Dental Injury: Teeth and Oropharynx as per pre-operative assessment

## 2019-04-17 NOTE — Progress Notes (Signed)
Inpatient Diabetes Program Recommendations  AACE/ADA: New Consensus Statement on Inpatient Glycemic Control (2015)  Target Ranges:  Prepandial:   less than 140 mg/dL      Peak postprandial:   less than 180 mg/dL (1-2 hours)      Critically ill patients:  140 - 180 mg/dL   Lab Results  Component Value Date   GLUCAP 260 (H) 04/17/2019   HGBA1C 12.2 (H) 04/17/2019    Review of Glycemic Control  Spoke with pt regarding her HgbA1C of 12.2%. Pt very sleepy, continued to fall asleep while talking.  Pt states her blood sugars are usually well-controlled at home. Monitors 3x/day.  Home: Lantus 20 units in am and 40 units QHS, Humalog 20 units tidwc (Med rec has 13 units tid)  Inpatient Diabetes Program Recommendations:     Add meal coverage insulin - Novolog 4 units tidwc  Will need to f/u with PCP regarding her diabetes control at home.   Thank you. Lorenda Peck, RD, LDN, CDE Inpatient Diabetes Coordinator [phone]

## 2019-04-17 NOTE — Op Note (Signed)
Procedure(s):  [name] [name] female 63 y.o. 04/17/2019  Procedure(s) and Anesthesia Type:    * INCISION AND DRAINAGE Right septic elbow- General      Surgeon: [name]   Assistants: Jeanmarie Hubert PA-C [facility] was present and scrubbed throughout the procedure and was essential in positioning, retraction, exposure, and closure)  Anesthesia: General endotracheal anesthesia    Procedure Detail   Estimated Blood Loss:  Minimal         Drains: 1 medium Hemovac  Blood Given: none         Specimens: none        Complications:  * No complications entered in OR log *         Disposition: PACU - hemodynamically stable.         Condition: stable    Procedure:   INDICATIONS FOR SURGERY: The patient suffered a distal biceps rupture. He was indicated for surgical treatment to restore flexion and supination of strength and endurance. He understood risks benefits alternatives to the procedure including but not limited to the risk of nonhealing, or stiffness, damage to neurovascular structures, heterotopic bone formation, and the potential need for future surgery.  OPERATIVE FINDINGS: Gross purulence in the elbow joint  DESCRIPTION OF PROCEDURE: The patient was identified in preoperative  holding area where I personally marked the operative site after  verifying site, side, and procedure with the patient. The patient was taken back  to the operating room where general anesthesia was induced without  Complication. The patient was kept in the supine position. The operative extremity was prepped and draped in standard sterile fashion.   Nonsterile tourniquet was used prior to prepping and draping.  Limb was elevated and tourniquet was elevated to 250 mmHg.  The previous incision was opened sharply over a proximally 8 cm area the lateral elbow.  Dissection was carried down through scar tissue to the lateral elbow.  The palpable lateral epicondyle was identified and the  lateral structures were opened roughly in Kocher's interval.  This was extended slightly up the lateral humeral column to allow greater exposure of the anterior joint.  Gross purulence was encountered.  A retractor was placed anteriorly in the joint to allow exposure.  3 L normal saline with cystoscopy tubing was used to copiously irrigate the joint.  At the conclusion of the irrigation with manual debridement the water was running clear.  At this point a medium Hemovac was placed into the joint and out percutaneously posterior lateral.  The deep fascia was closed in a single layer with 0 PDS absorbable suture and skin was closed in a single layer with 2-0 nylon in a horizontal mattress fashion.  A light sterile dressing was applied and the patient was allowed to awaken from anesthesia transferred to the stretcher and taken to the recovery room in stable condition.  Postoperative plan: Infectious disease service will be consulted regarding antibiotic management.  The drain will stay in 1 to 2 days and she will have suture removal in 10 to 14 days.

## 2019-04-17 NOTE — Consult Note (Addendum)
[facility] for Infectious Disease    Date of Admission:  04/15/2019           Day 2 vancomycin        Day 2 levofloxacin       Reason for Consult: Right elbow septic arthritis    Referring Provider: Dr. Malena Catholic Primary Care Provider: Dr. [name]  Assessment: She has developed septic arthritis of her right elbow 4 years after being treated for methicillin sensitive coagulase-negative staph infection at the site of her radial head fracture and repair.  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  If she grows the same organism from her current cultures I would be very concerned about chronic osteomyelitis.  We will continue vancomycin for now, especially given her history of probable beta-lactam allergy.  She has no symptoms to suggest UTI.  I will stop levofloxacin now.  She will probably need a central line placed by interventional radiology (given her chronic renal insufficiency) for outpatient IV antibiotic therapy.  I will follow-up in the morning.  Plan: 1. Continue vancomycin pending final cultures 2. Discontinue levofloxacin  Principal Problem:   Septic arthritis of elbow, right (HCC) Active Problems:   Hyperlipidemia   THROMBOCYTOPENIA   Essential hypertension   Hepatic cirrhosis (HCC)   UTI   CAD (coronary artery disease)   Hyponatremia   Normocytic anemia   History of MI (myocardial infarction)   CKD (chronic kidney disease), stage III (HCC)   Neuropathy   Diabetic peripheral neuropathy (HCC)   Ulcer of left heel, limited to breakdown of skin ([place])   Type 2 diabetes mellitus with hyperglycemia, with long-term current use of insulin (HCC)   Sleep apnea   Acute on chronic renal insufficiency   Pressure injury of skin   Retinopathy   Renal transplant, status post   Scheduled Meds: . aspirin EC  81 mg Oral BID  . atorvastatin  10 mg Oral q1800  . calcium-vitamin D  1 tablet Oral Daily  . docusate sodium  100 mg Oral BID  .  ferrous sulfate  325 mg Oral Daily  . guaiFENesin  1,200 mg Oral BID  . heparin  5,000 Units Subcutaneous Q8H  . HYDROmorphone      . insulin aspart      . insulin aspart  0-15 Units Subcutaneous TID WC  . insulin aspart  0-5 Units Subcutaneous QHS  . insulin glargine  20 Units Subcutaneous Daily  . insulin glargine  40 Units Subcutaneous QHS  . levofloxacin  750 mg Oral Q48H  . multivitamin with minerals  1 tablet Oral Daily  . nystatin   Topical TID   Continuous Infusions: . sodium chloride 75 mL/hr at 04/17/19 1216  . vancomycin     PRN Meds:.calcium carbonate (dosed in mg elemental calcium), camphor-menthol **AND** hydrOXYzine, docusate sodium, feeding supplement (NEPRO CARB STEADY), HYDROcodone-acetaminophen, HYDROmorphone (DILAUDID) injection, ipratropium-albuterol, metoCLOPramide **OR** metoCLOPramide (REGLAN) injection, ondansetron **OR** ondansetron (ZOFRAN) IV, ondansetron **OR** ondansetron (ZOFRAN) IV, sorbitol, zolpidem  HPI: [name] [name] is a 63 y.o. female with multiple medical problems including poorly controlled diabetes.  In January 2016 she fell and suffered a fracture dislocation of the head of her right radius.  She underwent arthroplasty but developed early postoperative infection due to methicillin sensitive coagulase-negative staph.  She had incision and drainage with hardware removal and placement of an external fixator.  She was treated with IV antibiotics.  Based on notes from  [facility] it appears that she had a serious allergic reaction to cefazolin and completed 6 weeks of therapy with clindamycin.  She recently began having fever, chills and increasing right elbow pain leading to admission 2 days ago.  Her temperature was 100.9 degrees.  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 Associated posttraumatic degenerative changes at the joint".  Arthrocentesis yielded  10 cc of pus.  The specimen clotted so no white blood cell count was performed but there was a predominance of segmented neutrophils.  No crystals were seen.  Gram stain of synovial fluid shows gram-positive cocci and cultures are pending.  She underwent incision and drainage and this morning.  She has had previous bilateral diabetic foot infections.  She has a chronic left heel ulcer and is status post partial resection of her left calcaneus.  X-ray does not reveal any evidence of osteomyelitis.   Review of Systems: Review of Systems  Constitutional: Positive for chills, fever and malaise/fatigue.  Genitourinary: Negative for dysuria.  Musculoskeletal: Positive for joint pain.    Past Medical History:  Diagnosis Date  . Acute MI [facility]) 2007   presented to ED & had cardiac cath- but found to have normal coronaries. Since that point in time her PCP cares f or cardiac needs. Dr. [name] - [facility]  . Anemia   . Anginal pain ([place])   . Anxiety   . Asthma   . Bulging lumbar disc   . Cataract   . 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 Cirrhosis of liver without mention of alcohol   . Constipation   . Dehiscence of closure of skin    left partial calcaneal excision  . Depression   . Diabetes mellitus    insulin dependent, adult onset  . Episode of visual loss of left eye   . Exertional shortness of breath   . Fatty liver   . Fibromyalgia   . GERD (gastroesophageal reflux disease)   . Hepatic steatosis   . High cholesterol   . Hypertension   . MRSA (methicillin resistant Staphylococcus aureus)   . Neuropathy    lower legs  . Osteoarthritis    hands, hips  . Proximal humerus fracture 10/15/12   Left  . PTSD (post-traumatic stress disorder)   . Renal insufficiency 05/05/2015    Social History   Tobacco Use  . Smoking status: Never Smoker  . Smokeless tobacco: Never Used  Substance Use Topics  . Alcohol use: No     Alcohol/week: 0.0 standard drinks  . Drug use: No    Family History  Problem Relation Age of Onset  . Heart disease Father   . Diabetes Father   . Colitis Father   . Crohn's disease Father   . Cancer Father        leukemia  . Leukemia Father   . Diabetes Mellitus II Brother   . Kidney disease Brother   . Heart disease Brother   . Diabetes Mother   . Hypertension Mother   . Mental illness Mother   . Irritable bowel syndrome Daughter   . Diabetes Mellitus II Brother   . Kidney disease Brother   . Liver disease Brother   . Kidney disease Brother   . Heart attack Brother   . Diabetes Mellitus II Brother   . Heart disease Brother   .  Liver disease Brother   . Kidney disease Brother   . Kidney disease Brother   . Diabetes Mellitus II Brother   . Diabetes Mellitus I Brother    Allergies  Allergen Reactions  . Bee Pollen Anaphylaxis  . Fish-Derived Products Hives, Shortness Of Breath, Swelling and Rash    Hives get in throat causing trouble breathing  . Mushroom Extract Complex Anaphylaxis  . Penicillins Anaphylaxis    Did it involve swelling of the face/tongue/throat, SOB, or low BP? Yes Did it involve sudden or severe rash/hives, skin peeling, or any reaction on the inside of your mouth or nose? No Did you need to seek medical attention at a hospital or doctor's office? Yes When did it last happen?A few months ago If all above answers are "NO", may proceed with cephalosporin use.  [name] Kitchen Rosemary Oil Anaphylaxis  . Shellfish Allergy Hives, Shortness Of Breath, Swelling and Rash  . Tomato Hives and Shortness Of Breath    Hives in throat causes her trouble breathing  . Acetaminophen Other (See Comments)    GI upset  . Acyclovir And Related Other (See Comments)    Unknown rxn  . Aloe Vera Hives  . Naproxen Other (See Comments)    Unknown rxn    OBJECTIVE: Blood pressure 102/61, pulse 86, temperature 98.2 F (36.8 C), temperature source Oral, resp. rate 13, height 5'  2" (1.575 m), weight 90.3 kg, SpO2 96 %.  Physical Exam  Lab Results Lab Results  Component Value Date   WBC 7.8 04/17/2019   HGB 9.0 (L) 04/17/2019   HCT 28.8 (L) 04/17/2019   MCV 86.5 04/17/2019   PLT 122 (L) 04/17/2019    Lab Results  Component Value Date   CREATININE 2.27 (H) 04/17/2019   BUN 62 (H) 04/17/2019   NA 128 (L) 04/17/2019   K 4.3 04/17/2019   CL 98 04/17/2019   CO2 19 (L) 04/17/2019    Lab Results  Component Value Date   ALT 24 04/17/2019   AST 34 04/17/2019   ALKPHOS 78 04/17/2019   BILITOT 0.8 04/17/2019     Microbiology: Recent Results (from the past 240 hour(s))  SARS Coronavirus 2 (Hosp order,Performed in [place] lab via [employer] ID)     Status: None   Collection Time: 04/15/19 11:14 AM   Specimen: Dry Nasal Swab ([employer] [facility] Now)  Result Value Ref Range Status   SARS Coronavirus 2 ([employer] Now) NEGATIVE NEGATIVE Final    Comment: (NOTE) Interpretive Result Comment(s): COVID 19 Positive SARS CoV 2 target nucleic acids are DETECTED. The SARS CoV 2 RNA is generally detectable in upper and lower respiratory specimens during the acute phase of infection.  Positive results are indicative of active infection with SARS CoV 2.  Clinical correlation with patient history and other diagnostic information is necessary to determine patient infection status.  Positive results do not rule out bacterial infection or coinfection with other viruses. The expected result is Negative. COVID 19 Negative SARS CoV 2 target nucleic acids are NOT DETECTED. The SARS CoV 2 RNA is generally detectable in upper and lower respiratory specimens during the acute phase of infection.  Negative results do not preclude SARS CoV 2 infection, do not rule out coinfections with other pathogens, and should not be used as the sole basis for treatment or other patient management decisions.  Negative results must be combined with clinical  observations, patient history, and  epidemiological information. The expected result is Negative. Invalid Presence or  absence of SARS CoV 2 nucleic acids cannot be determined. Repeat testing was performed on the submitted specimen and repeated Invalid results were obtained.  If clinically indicated, additional testing on a new specimen with an alternate test methodology [phone]) is advised.  The SARS CoV 2 RNA is generally detectable in upper and lower respiratory specimens during the acute phase of infection. The expected result is Negative. Fact Sheet for Patients:  GolfingFamily.no Fact Sheet for Healthcare Providers: https://www.hernandez-brewer.com/ This test is not yet approved or cleared by the [place] FDA and has been authorized for detection and/or diagnosis of SARS CoV 2 by FDA under an Emergency Use Authorization (EUA).  This EUA will remain in effect (meaning this test can be used) for the duration of the COVID19 d eclaration under Section 564(b)(1) of the Act, 21 U.S.C. section [phone] 3(b)(1), unless the authorization is terminated or revoked sooner. Performed at [facility], [address] [place], [place] [address]   Body fluid culture     Status: None (Preliminary result)   Collection Time: 04/16/19  9:00 PM   Specimen: Synovium; Body Fluid  Result Value Ref Range Status   Specimen Description   Final    SYNOVIAL RIGHT ELBOW Performed at [facility] [address], [place], [place] [address]    Special Requests   Final    Immunocompromised Performed at [facility], [place] [address] [place], [place] [address]    Gram Stain   Final    FEW WBC PRESENT, PREDOMINANTLY PMN MODERATE GRAM POSITIVE COCCI    Culture   Final    TOO YOUNG TO READ Performed at [facility], [place] [address] [place], [place] [address]    Report Status PENDING  Incomplete  MRSA PCR Screening     Status: None   Collection  Time: 04/16/19 10:01 PM   Specimen: Nasal Mucosa; Nasopharyngeal  Result Value Ref Range Status   MRSA by PCR NEGATIVE NEGATIVE Final    Comment:        The GeneXpert MRSA Assay (FDA approved for NASAL specimens only), is one component of a comprehensive MRSA colonization surveillance program. It is not intended to diagnose MRSA infection nor to guide or monitor treatment for MRSA infections. Performed at [name], [place] [address], [place], [place] [address]   CT scan 04/15/2019  IMPRESSION: 1. No acute fracture of the humerus is identified. 2. 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 Associated posttraumatic degenerative changes at the joint.    By: Marijo Sanes M.D.   On: 04/15/2019 14:55    Michel Bickers, [place] for [facility] [phone] pager   [phone] cell 04/17/2019, 3:13 PM

## 2019-04-17 NOTE — Progress Notes (Signed)
PROGRESS NOTE    [name] [name]  DVV:[id number] DOB: Jan 22, 1956 DOA: 04/15/2019 PCP: Ann Held, DO  Brief Narrative:  HPI per Dr. Gala Romney on 04/15/2019 [name] [name] is a 63 y.o. female with medical history significant of coronary artery disease, hypertension, chronic pain syndrome, anxiety disorder, asthma, multiple skin ulcers, left foot ongoing also, right elbow surgery, chronic kidney disease stage III with history of transplant previously according to patient, diabetes and hypertension who presented to [facility] today complaining of fever chills as well as pain and swelling of the right elbow.  She reported falling and injuring the area about 3 days ago.  She was seen in the ER yesterday with unremarkable x-rays and patient was sent home.  Patient returned today with fever pain a 10 out of 10.  Denied any cough shortness of breath or dysuria.  Patient was noted to have significant swelling tenderness and redness of the right elbow.  She also had fever in the ER.  Additionally has acute on chronic kidney disease with possible UTI.  She has been admitted to the medical service for treatment.  She denied any other injury.  Denied any nausea vomiting or diarrhea..  ED Course: Temperature is 101.6 blood pressure 101/57, pulse 110 respiratory 24 oxygen sat 82% on room air currently at 100% on 2 L.  White count is 11,000 hemoglobin 9.2 and platelets of 113. Creatinine is 3.47.  Previous creatinine was 2.4.  Urinalysis showed poor collection but few bacteria.  RBC 21-50 squamous 11-20 but WBC also 21-50. X-ray of the right elbow yesterday showed possible effusion.  **Interim History IV fluids have been continued and creatinine is trending down.  Orthopedic surgery has been consulted and they did bedside I and D and they were concerned for Septic Arthritis so took the patient for ID in the OR this AM and Cx's Sent. ID was consulted for further evaluation and  recommendations and recommending continuing IV Vancomycin and likely needing a Central Line due to Chronic Renal Insuffiencey for long term Antibiotics.  Assessment & Plan:   Principal Problem:   Septic arthritis of elbow, right (HCC) Active Problems:   Hyperlipidemia   THROMBOCYTOPENIA   Essential hypertension   Hepatic cirrhosis (HCC)   UTI   CAD (coronary artery disease)   Hyponatremia   Normocytic anemia   History of MI (myocardial infarction)   CKD (chronic kidney disease), stage III (HCC)   Neuropathy   Diabetic peripheral neuropathy (HCC)   Ulcer of left heel, limited to breakdown of skin ([place])   Type 2 diabetes mellitus with hyperglycemia, with long-term current use of insulin (HCC)   Sleep apnea   Acute on chronic renal insufficiency   Pressure injury of skin   Retinopathy   Renal transplant, status post  Acute on Chronic Kidney Injury Stage 3, improving Metabolic Acidosis -Patient has possible prerenal causes but likely in the setting of infection from elbow -Patient is DNR/creatinine went from 69/3.47 is now 62/2.27 -With her past history of chronic kidney disease as well as history of falls we will get renal ultrasound to rule out obstructive causes.   -In the meantime hydrate the patient and monitor renal function. -Renal ultrasound showed no hydronephrosis but patient was having significant bladder retention -Continue bladder scans and in and out cath and if continues to drain will place a Foley catheter -BUN/creatinine is trending down with IV fluid hydration and will reduce rate to 75 mL/hr -Follow-up on urine  culture -Strict I's and O's and daily weights -Continue to Monitor and Trend Renal Fxn and repeat CMP in AM   Acute Urinary Retention -Continue with INO cath per hospital policy -If patient continues to not void will place Foley catheter -Will need to rebladder scan; IF retaining >250 will have Foley placed  Acute respiratory failure with  hypoxia -Unclear etiology at this point -Chest x-ray showed no active disease repeat chest x-ray this morning again showed no active disease -Add flutter valve, incentive spirometry, guaifenesin 1200 g p.o. twice daily -Add duo nebs scheduled -Patient continues to be on 2 L of supplemental oxygen via nasal cannula will attempt to wean as tolerated -Continue monitor and repeat chest x-ray in a.m.  Right elbow swelling and tenderness with concern for Cellulitis and now Septic Arthritis  -Elbow is warm to touch swollen and tender.   -Appears to have some effusion in there.   -Suspect posttraumatic versus septic elbow.   -WBC trended down from 11,000 now 7,800 -Continue to treat with Vanco and Levaquin but IV Levaquin has now been discontinued by infectious diseases -Orthopedic consult placed and Dr. [name] evaluated and did a bedside I&D but then took the patient to the OR this morning and felt the patient had septic arthritis; patient's elbow was debrided and infectious disease was consulted for further antibiotic management and a drain was placed in the patient's elbow and she will have surgery removal in 10 to 14 days -Initial synovial culture showed moderate gram-positive cocci but the cultures were too young to read so we will follow-up on susceptibilities and -CRP is 32.3 and ESR was 126 -Further management by ID and they are planning on long-term antibiotic treatment  Uncontrolled Diabetes Insulin Dependent  -Continue home regimen with Lantus with  Moderate Novolog sliding scale insulin. -Blood sugars are currently uncontrolled and will need further adjustments -Pulmonary was he was 12.2 -Continue with Lantus 20 units in a.m. and 40 units nightly along with meal coverage with 4 units of NovoLog 3 times daily -Consulting Diabetes Education Coordinator will follow up with patient and patient will need stricter diabetic control  Hypertension -Blood pressure is elevated.  Resume home  regimen and monitor closely and adjust as necessary.  Coronary artery disease -Stable appears to be at baseline.  Suspected UTI -Sample appears contaminated.   -May recollect urine.  -As above.  WBC trended down from 11,000 and is now 7,800 -Will empirically be on treatment. -Follow Culturesbut urine Cx never sent   Normocytic Anemia/Anemia of Chronic Kdney Disease -BUN/creatinine is currently stable at 9.3/20.5 and is now 9.0/28.8 -Check anemia panel in a.m. -Continue monitor for signs and symptoms of bleeding; no overt bleeding noted currently -Repeat CBC in a.m.  Hyponatremia -It appears to be chronic -Improved with IV fluid resuscitation initially but slightly dropped to 128 -Continue to monitor and trend and now sodium levels 128  Elevated troponin -In setting of renal failure -Currently denies any chest pain -Continue monitor and trend and was trending downward and went from 0.10 -> 0.15 -> 0.09 -> 0.09 -> 0.07  Obesity -Estimated body mass index is 36.41 kg/m as calculated from the following:   Height as of this encounter: _0  (1.575 m).   Weight as of this encounter: 90.3 kg. -Weight Loss and Dietary Counseling given   Hyperlipidemia -Continue Atorvastatin 10 mg p.o. nightly  Diabetic Foot Wound -She has a wound on the heel of her foot along with a wound on the big toe and  fifth toe -Patient does have pulses to the left foot -We will check vascular ABI resting ankle-brachial index was within normal limits on the right and the left resting ankle-brachial index is within normal range with no evidence of significant lower extremity arterial disease noted. -We will consult wound nurse -We will need stricter control of her blood sugars -We will refer to vascular in outpatient setting -Patient is currently on antibiotics with IV vancomycin will continue -She follows up at the wound clinic as an outpatient -Peripheral vascular navigator contacted and will follow  up with the patient  DVT prophylaxis: Heparin 5000 units subcu every 8 Code Status: FULL CODE Family Communication: No family present at bedside  Disposition Plan: Pending further workup and treatment  Consultants:   Orthopedic Surgery   Infectious Diseases   Procedures: Renal U/S  Antimicrobials:  Anti-infectives (From admission, onward)   Start     Dose/Rate Route Frequency Ordered Stop   04/17/19 2330  vancomycin (VANCOCIN) IVPB 750 mg/150 ml premix     750 mg 150 mL/hr over 60 Minutes Intravenous Every 48 hours 04/15/19 2329     04/17/19 2200  levofloxacin (LEVAQUIN) tablet 500 mg  Status:  Discontinued     500 mg Oral Every 48 hours 04/15/19 2252 04/16/19 1020   04/17/19 1800  levofloxacin (LEVAQUIN) tablet 750 mg  Status:  Discontinued     750 mg Oral Every 48 hours 04/16/19 1020 04/17/19 1535   04/16/19 0000  vancomycin (VANCOCIN) 1,750 mg in sodium chloride 0.9 % 500 mL IVPB     1,750 mg 250 mL/hr over 120 Minutes Intravenous  Once 04/15/19 2319 04/16/19 0220   04/15/19 2200  levofloxacin (LEVAQUIN) IVPB 750 mg     750 mg 100 mL/hr over 90 Minutes Intravenous  Once 04/15/19 2142 04/15/19 2352     Subjective: Seen and examined at bedside   Objective: Vitals:   04/17/19 0849 04/17/19 1022 04/17/19 1148 04/17/19 1246  BP: 116/60 107/67 (!) 104/59 102/61  Pulse: 74 81 84 86  Resp: _0 Temp: 97.8 F (36.6 C) 97.7 F (36.5 C) 98.1 F (36.7 C) 98.2 F (36.8 C)  TempSrc: Oral Oral Oral Oral  SpO2: 93% 96% 95% 96%  Weight:      Height:        Intake/Output Summary (Last 24 hours) at 04/17/2019 1821 Last data filed at 04/17/2019 0815 Gross per 24 hour  Intake 300 ml  Output 700 ml  Net -400 ml   Filed Weights   04/15/19 1013 04/15/19 2058 04/16/19 0528  Weight: 77.1 kg 91.5 kg 90.3 kg   Examination: Physical Exam:  Constitutional: Well-nourished, well-developed obese Caucasian female currently no acute distress appears slightly drowsy and  somnolent coming back from her surgery Eyes: Lids extract are normal.  Sclera anicteric ENMT: External ears nose appear normal.  Grossly normal hearing Neck: Appears supple no JVD Respiratory: Slight diminished auscultation bilaterally no appreciable wheezing, rales or rhonchi.  Patient not tachypneic or using accessory muscles breathe Cardiovascular: Slightly tachycardic rate but regular rhythm.  Transfer from edema.  Has 2+ pedal pulses palpated Abdomen: Soft, nontender, distended secondary body habitus.  Bowel sounds present. GU: Deferred Musculoskeletal: Has a partially amputated right foot.  Right elbow was dressed and has a draining Skin: Has a diabetic foot wound on the left foot on the heel and the big toe along with the pinky toe.  No appreciable rashes or lesions on limited skin evaluation Neurologic: Cranial nerves  II through XII gross intact no appreciable focal deficits.  Romberg sign cerebellar reflexes were not assessed Psychiatric: Normal judgment and insight.  She was a little drowsy, and back from her surgery.  Not as anxious today  Data Reviewed: I have personally reviewed following labs and imaging studies  CBC: Recent Labs  Lab 04/15/19 1113 04/15/19 2152 04/16/19 0825 04/17/19 0158  WBC 9.9 11.0* 10.0 7.8  NEUTROABS  --   --  8.5* 6.6  HGB 9.3* 9.2* 9.3* 9.0*  HCT 28.8* 28.9* 28.5* 28.8*  MCV 84.0 85.8 85.6 86.5  PLT 112* 113* 120* 119*   Basic Metabolic Panel: Recent Labs  Lab 04/15/19 1113 04/15/19 2152 04/16/19 0825 04/17/19 0158  NA 122*  --  129* 128*  K 4.7  --  4.5 4.3  CL 90*  --  97* 98  CO2 21*  --  19* 19*  GLUCOSE 333*  --  297* 307*  BUN 69*  --  64* 62*  CREATININE 3.47* 3.06* 2.64* 2.27*  CALCIUM 8.0*  --  8.0* 7.9*  MG  --   --  1.9 1.9  PHOS  --   --  3.2 2.5   GFR: Estimated Creatinine Clearance: 26.5 mL/min (A) (by C-G formula based on SCr of 2.27 mg/dL (H)). Liver Function Tests: Recent Labs  Lab 04/15/19 1113  04/16/19 0825 04/17/19 0158  AST 23 29 34  ALT _0 ALKPHOS 73 72 78  BILITOT 1.1 1.0 0.8  PROT 7.2 6.9 6.3*  ALBUMIN 3.0* 2.8* 2.4*   No results for input(s): LIPASE, AMYLASE in the last 168 hours. No results for input(s): AMMONIA in the last 168 hours. Coagulation Profile: No results for input(s): INR, PROTIME in the last 168 hours. Cardiac Enzymes: Recent Labs  Lab 04/15/19 1113 04/15/19 1603 04/16/19 1956 04/17/19 0158 04/17/19 0827  TROPONINI 0.10* 0.15* 0.09* 0.09* 0.07*   BNP (last 3 results) No results for input(s): PROBNP in the last 8760 hours. HbA1C: Recent Labs    04/17/19 0158  HGBA1C 12.2*   CBG: Recent Labs  Lab 04/16/19 2145 04/17/19 0724 04/17/19 0856 04/17/19 1151 04/17/19 1709  GLUCAP 279* 239* 264* 260* 201*   Lipid Profile: No results for input(s): CHOL, HDL, LDLCALC, TRIG, CHOLHDL, LDLDIRECT in the last 72 hours. Thyroid Function Tests: No results for input(s): TSH, T4TOTAL, FREET4, T3FREE, THYROIDAB in the last 72 hours. Anemia Panel: No results for input(s): VITAMINB12, FOLATE, FERRITIN, TIBC, IRON, RETICCTPCT in the last 72 hours. Sepsis Labs: No results for input(s): PROCALCITON, LATICACIDVEN in the last 168 hours.  Recent Results (from the past 240 hour(s))  SARS Coronavirus 2 (Hosp order,Performed in [facility] lab via [employer] ID)     Status: None   Collection Time: 04/15/19 11:14 AM   Specimen: Dry Nasal Swab ([employer] ID Now)  Result Value Ref Range Status   SARS Coronavirus 2 ([employer] ID Now) NEGATIVE NEGATIVE Final    Comment: (NOTE) Interpretive Result Comment(s): COVID 19 Positive SARS CoV 2 target nucleic acids are DETECTED. The SARS CoV 2 RNA is generally detectable in upper and lower respiratory specimens during the acute phase of infection.  Positive results are indicative of active infection with SARS CoV 2.  Clinical correlation with patient history and other diagnostic information is necessary to determine  patient infection status.  Positive results do not rule out bacterial infection or coinfection with other viruses. The expected result is Negative. COVID 19 Negative SARS CoV 2 target nucleic acids are  NOT DETECTED. The SARS CoV 2 RNA is generally detectable in upper and lower respiratory specimens during the acute phase of infection.  Negative results do not preclude SARS CoV 2 infection, do not rule out coinfections with other pathogens, and should not be used as the sole basis for treatment or other patient management decisions.  Negative results must be combined with clinical  observations, patient history, and epidemiological information. The expected result is Negative. Invalid Presence or absence of SARS CoV 2 nucleic acids cannot be determined. Repeat testing was performed on the submitted specimen and repeated Invalid results were obtained.  If clinically indicated, additional testing on a new specimen with an alternate test methodology [id number]) is advised.  The SARS CoV 2 RNA is generally detectable in upper and lower respiratory specimens during the acute phase of infection. The expected result is Negative. Fact Sheet for Patients:  GolfingFamily.no Fact Sheet for Healthcare Providers: https://www.hernandez-brewer.com/ This test is not yet approved or cleared by the [place] FDA and has been authorized for detection and/or diagnosis of SARS CoV 2 by FDA under an Emergency Use Authorization (EUA).  This EUA will remain in effect (meaning this test can be used) for the duration of the COVID19 d eclaration under Section 564(b)(1) of the Act, 21 U.S.C. section [phone] 3(b)(1), unless the authorization is terminated or revoked sooner. Performed at [facility], [address] [place], [place] [address]   Body fluid culture     Status: None (Preliminary result)   Collection Time: 04/16/19  9:00 PM   Specimen: Synovium;  Body Fluid  Result Value Ref Range Status   Specimen Description   Final    SYNOVIAL RIGHT ELBOW Performed at [facility] [address], [place], [place] [address]    Special Requests   Final    Immunocompromised Performed at [facility], [place] [address], [place], [place] [address]    Gram Stain   Final    FEW WBC PRESENT, PREDOMINANTLY PMN MODERATE GRAM POSITIVE COCCI    Culture   Final    TOO YOUNG TO READ Performed at [facility], [place] [address], [place], [place] [address]    Report Status PENDING  Incomplete  MRSA PCR Screening     Status: None   Collection Time: 04/16/19 10:01 PM   Specimen: Nasal Mucosa; Nasopharyngeal  Result Value Ref Range Status   MRSA by PCR NEGATIVE NEGATIVE Final    Comment:        The GeneXpert MRSA Assay (FDA approved for NASAL specimens only), is one component of a comprehensive MRSA colonization surveillance program. It is not intended to diagnose MRSA infection nor to guide or monitor treatment for MRSA infections. Performed at [facility], [place] [address], [place],  [address]     Radiology Studies: US Renal  Result Date: 04/16/2019 CLINICAL DATA:  Acute renal insufficiency. EXAM: RENAL / URINARY TRACT ULTRASOUND COMPLETE COMPARISON:  CT scan January 12, 2017 FINDINGS: Right Kidney: Renal measurements: 11.1 x 5.4 x 5.2 cm = volume: 164 mL . Echogenicity within normal limits. No mass or hydronephrosis visualized. Left Kidney: Renal measurements: 10.4 x 4.9 x 5.1 cm = volume: 135 mL. Echogenicity within normal limits. No mass or hydronephrosis visualized. Bladder: Appears normal for degree of bladder distention. IMPRESSION: Normal study. Electronically Signed   By: Dorise Bullion III M.D   On: 04/16/2019 08:27   Dg Chest Port 1 View  Result Date:  04/17/2019 CLINICAL DATA:  Shortness of breath. EXAM: PORTABLE CHEST 1 VIEW COMPARISON:  04/16/2019 and older exams.  FINDINGS: Cardiac silhouette is normal in size. No mediastinal or hilar masses. No evidence of adenopathy. Clear lungs.  No pleural effusion or pneumothorax. Old proximal left humerus and left clavicle fractures, stable. IMPRESSION: No active disease. Electronically Signed   By: Lajean Manes M.D.   On: 04/17/2019 08:40   Dg Chest Port 1 View  Result Date: 04/16/2019 CLINICAL DATA:  Shortness of breath.  Hypoxia. EXAM: PORTABLE CHEST 1 VIEW COMPARISON:  April 15, 2019 FINDINGS: The heart size and mediastinal contours are within normal limits. Both lungs are clear. The visualized skeletal structures are unremarkable. IMPRESSION: No active disease. Electronically Signed   By: Dorise Bullion III M.D   On: 04/16/2019 15:52   Dg Foot Complete Left  Result Date: 04/17/2019 CLINICAL DATA:  Diabetic foot ulcer EXAM: LEFT FOOT - COMPLETE 3+ VIEW COMPARISON:  04/06/2017 FINDINGS: Resection of the posterior calcaneus appears healed without evidence of acute osteomyelitis. Negative for fracture. No acute osteomyelitis elsewhere Plantar ulcer with soft tissue swelling has improved since the prior study. IMPRESSION: Plantar foot ulcer. Negative for osteomyelitis. Postop resection posterior calcaneus. Electronically Signed   By: Franchot Gallo M.D.   On: 04/17/2019 14:54   Vas [name] Burnard Bunting With/wo Tbi  Result Date: 04/17/2019 LOWER EXTREMITY DOPPLER STUDY Indications: Ulceration. High Risk Factors: Hypertension, Diabetes, coronary artery disease.  Comparison Study: Prior normal study from 04/08/17 is available for comparison. Performing Technologist: Sharion Dove RVS  Examination Guidelines: A complete evaluation includes at minimum, Doppler waveform signals and systolic blood pressure reading at the level of bilateral brachial, anterior tibial, and posterior tibial arteries, when vessel segments are accessible. Bilateral testing is considered an integral part of a complete examination. Photoelectric Plethysmograph (PPG)  waveforms and toe systolic pressure readings are included as required and additional duplex testing as needed. Limited examinations for reoccurring indications may be performed as noted.  ABI Findings: +---------+------------------+-----+--------+---------------------------------+  Right     Rt Pressure (mmHg) Index Waveform Comment                            +---------+------------------+-----+--------+---------------------------------+  Brachial                                    restricted, had elbow surgery                                                   today                              +---------+------------------+-----+--------+---------------------------------+  PTA       131                1.12  biphasic                                    +---------+------------------+-----+--------+---------------------------------+  DP        128                1.09  biphasic                                    +---------+------------------+-----+--------+---------------------------------+  Great Toe 93                 0.79                                              +---------+------------------+-----+--------+---------------------------------+ +---------+------------------+-----+---------+----------+  Left      Lt Pressure (mmHg) Index Waveform  Comment     +---------+------------------+-----+---------+----------+  Brachial  117                      triphasic             +---------+------------------+-----+---------+----------+  PTA       127                1.09  biphasic              +---------+------------------+-----+---------+----------+  DP        143                1.22  biphasic              +---------+------------------+-----+---------+----------+  Great Toe                                    ulceration  +---------+------------------+-----+---------+----------+ +-------+-----------+-----------+------------+------------+  ABI/TBI Today's ABI Today's TBI Previous ABI Previous TBI   +-------+-----------+-----------+------------+------------+  Right   1.1         0.79        1.1                        +-------+-----------+-----------+------------+------------+  Left    1.2                     1.1                        +-------+-----------+-----------+------------+------------+ Right ABIs appear essentially unchanged. Left ABIs appear essentially unchanged.  Summary: Right: Resting right ankle-brachial index is within normal range. No evidence of significant right lower extremity arterial disease. The right toe-brachial index is normal. Left: Resting left ankle-brachial index is within normal range. No evidence of significant left lower extremity arterial disease.  *See table(s) above for measurements and observations.  Electronically signed by Servando Snare MD on 04/17/2019 at 4:18:08 PM.    Final    Scheduled Meds:  aspirin EC  81 mg Oral BID   atorvastatin  10 mg Oral q1800   calcium-vitamin D  1 tablet Oral Daily   docusate sodium  100 mg Oral BID   ferrous sulfate  325 mg Oral Daily   guaiFENesin  1,200 mg Oral BID   heparin  5,000 Units Subcutaneous Q8H   HYDROmorphone       insulin aspart       insulin aspart  0-15 Units Subcutaneous TID WC   insulin aspart  0-5 Units Subcutaneous QHS   [START ON 04/18/2019] insulin aspart  4 Units Subcutaneous TID WC   insulin glargine  20 Units Subcutaneous Daily   insulin glargine  40 Units Subcutaneous QHS   multivitamin with minerals  1 tablet Oral Daily   nystatin   Topical TID   Continuous Infusions:  sodium chloride 75 mL/hr at 04/17/19 1216   vancomycin  LOS: 2 days   Kerney Elbe, DO [facility] PAGER is on AMION  If 7PM-7AM, please contact night-coverage [email or web address] Password [facility] 04/17/2019, 6:21 PM

## 2019-04-17 NOTE — Transfer of Care (Signed)
Immediate Anesthesia Transfer of Care Note  Patient: [name] [name]  Procedure(s) Performed: INCISION AND DRAINAGE Right arm (Right )  Patient Location: PACU  Anesthesia Type:General  Level of Consciousness: awake, alert  and oriented  Airway & Oxygen Therapy: Patient Spontanous Breathing and Patient connected to face mask oxygen  Post-op Assessment: Report given to RN and Post -op Vital signs reviewed and stable  Post vital signs: Reviewed and stable  Last Vitals:  Vitals Value Taken Time  BP 124/67 04/17/19 0717  Temp    Pulse 90 04/17/19 0721  Resp 11 04/17/19 0721  SpO2 100 % 04/17/19 0721  Vitals shown include unvalidated device data.  Last Pain:  Vitals:   04/17/19 0458  TempSrc: Oral  PainSc:       Patients Stated Pain Goal: 3 (60/16/58 0063)  Complications: No apparent anesthesia complications

## 2019-04-18 ENCOUNTER — Encounter (HOSPITAL_COMMUNITY): Payer: Self-pay | Admitting: Orthopedic Surgery

## 2019-04-18 DIAGNOSIS — E1142 Type 2 diabetes mellitus with diabetic polyneuropathy: Secondary | ICD-10-CM

## 2019-04-18 DIAGNOSIS — B9561 Methicillin susceptible Staphylococcus aureus infection as the cause of diseases classified elsewhere: Secondary | ICD-10-CM

## 2019-04-18 DIAGNOSIS — H3500 Unspecified background retinopathy: Secondary | ICD-10-CM

## 2019-04-18 DIAGNOSIS — Z8614 Personal history of Methicillin resistant Staphylococcus aureus infection: Secondary | ICD-10-CM

## 2019-04-18 DIAGNOSIS — M00021 Staphylococcal arthritis, right elbow: Principal | ICD-10-CM

## 2019-04-18 DIAGNOSIS — Z940 Kidney transplant status: Secondary | ICD-10-CM

## 2019-04-18 DIAGNOSIS — N289 Disorder of kidney and ureter, unspecified: Secondary | ICD-10-CM

## 2019-04-18 DIAGNOSIS — L97421 Non-pressure chronic ulcer of left heel and midfoot limited to breakdown of skin: Secondary | ICD-10-CM

## 2019-04-18 DIAGNOSIS — D649 Anemia, unspecified: Secondary | ICD-10-CM

## 2019-04-18 DIAGNOSIS — I2510 Atherosclerotic heart disease of native coronary artery without angina pectoris: Secondary | ICD-10-CM

## 2019-04-18 DIAGNOSIS — G629 Polyneuropathy, unspecified: Secondary | ICD-10-CM

## 2019-04-18 DIAGNOSIS — D696 Thrombocytopenia, unspecified: Secondary | ICD-10-CM

## 2019-04-18 DIAGNOSIS — N189 Chronic kidney disease, unspecified: Secondary | ICD-10-CM

## 2019-04-18 LAB — GLUCOSE, CAPILLARY
Glucose-Capillary: 113 mg/dL — ABNORMAL HIGH (ref 70–99)
Glucose-Capillary: 104 mg/dL — ABNORMAL HIGH (ref 70–99)
Glucose-Capillary: 84 mg/dL (ref 70–99)
Glucose-Capillary: 96 mg/dL (ref 70–99)

## 2019-04-18 LAB — COMPREHENSIVE METABOLIC PANEL
ALT: 32 U/L (ref 0–44)
AST: 52 U/L — ABNORMAL HIGH (ref 15–41)
Albumin: 2.3 g/dL — ABNORMAL LOW (ref 3.5–5.0)
Alkaline Phosphatase: 82 U/L (ref 38–126)
Anion gap: 8 (ref 5–15)
BUN: 58 mg/dL — ABNORMAL HIGH (ref 8–23)
CO2: 20 mmol/L — ABNORMAL LOW (ref 22–32)
Calcium: 8.2 mg/dL — ABNORMAL LOW (ref 8.9–10.3)
Chloride: 103 mmol/L (ref 98–111)
Creatinine, Ser: 1.92 mg/dL — ABNORMAL HIGH (ref 0.44–1.00)
GFR calc Af Amer: 32 mL/min — ABNORMAL LOW (ref >60)
GFR calc non Af Amer: 27 mL/min — ABNORMAL LOW (ref >60)
Glucose, Bld: 172 mg/dL — ABNORMAL HIGH (ref 70–99)
Potassium: 3.7 mmol/L (ref 3.5–5.1)
Sodium: 131 mmol/L — ABNORMAL LOW (ref 135–145)
Total Bilirubin: 0.8 mg/dL (ref 0.3–1.2)
Total Protein: 6.1 g/dL — ABNORMAL LOW (ref 6.5–8.1)

## 2019-04-18 LAB — CBC WITH DIFFERENTIAL/PLATELET
Abs Immature Granulocytes: 0.1 10*3/uL — ABNORMAL HIGH (ref 0.00–0.07)
Basophils Absolute: 0 10*3/uL (ref 0.0–0.1)
Basophils Relative: 0 %
Eosinophils Absolute: 0.1 10*3/uL (ref 0.0–0.5)
Eosinophils Relative: 1 %
HCT: 28 % — ABNORMAL LOW (ref 36.0–46.0)
Hemoglobin: 8.7 g/dL — ABNORMAL LOW (ref 12.0–15.0)
Immature Granulocytes: 1 %
Lymphocytes Relative: 9 %
Lymphs Abs: 0.7 10*3/uL (ref 0.7–4.0)
MCH: 26.7 pg (ref 26.0–34.0)
MCHC: 31.1 g/dL (ref 30.0–36.0)
MCV: 85.9 fL (ref 80.0–100.0)
Monocytes Absolute: 0.9 10*3/uL (ref 0.1–1.0)
Monocytes Relative: 11 %
Neutro Abs: 6.3 10*3/uL (ref 1.7–7.7)
Neutrophils Relative %: 78 %
Platelets: 159 10*3/uL (ref 150–400)
RBC: 3.26 MIL/uL — ABNORMAL LOW (ref 3.87–5.11)
RDW: 15.3 % (ref 11.5–15.5)
WBC: 8.1 10*3/uL (ref 4.0–10.5)
nRBC: 0.2 % (ref 0.0–0.2)

## 2019-04-18 LAB — PHOSPHORUS: Phosphorus: 1.9 mg/dL — ABNORMAL LOW (ref 2.5–4.6)

## 2019-04-18 LAB — MAGNESIUM: Magnesium: 2 mg/dL (ref 1.7–2.4)

## 2019-04-18 MED ORDER — PRO-STAT SUGAR FREE PO LIQD
30.0000 mL | Freq: Two times a day (BID) | ORAL | Status: DC
  Administered 2019-04-19 – 2019-04-21 (×4): 30 mL via ORAL
  Filled 2019-04-18 (×5): qty 30

## 2019-04-18 MED ORDER — K PHOS MONO-SOD PHOS DI & MONO 155-852-130 MG PO TABS
500.0000 mg | ORAL_TABLET | Freq: Two times a day (BID) | ORAL | Status: AC
  Administered 2019-04-18 (×2): 500 mg via ORAL
  Filled 2019-04-18 (×3): qty 2

## 2019-04-18 MED ORDER — LORAZEPAM 2 MG/ML IJ SOLN
1.0000 mg | Freq: Once | INTRAMUSCULAR | Status: AC
  Administered 2019-04-18: 1 mg via INTRAVENOUS
  Filled 2019-04-18: qty 1

## 2019-04-18 MED ORDER — ENSURE MAX PROTEIN PO LIQD
11.0000 [oz_av] | Freq: Two times a day (BID) | ORAL | Status: DC
  Administered 2019-04-18 – 2019-04-20 (×2): 11 [oz_av] via ORAL
  Filled 2019-04-18 (×8): qty 330

## 2019-04-18 MED ORDER — VANCOMYCIN HCL IN DEXTROSE 1-5 GM/200ML-% IV SOLN: 1000.0000 mg | INTRAVENOUS | Status: DC

## 2019-04-18 NOTE — Plan of Care (Signed)
  Problem: Health Behavior/Discharge Planning: Goal: Ability to manage health-related needs will improve Outcome: Not Progressing   

## 2019-04-18 NOTE — Progress Notes (Signed)
Pt upset stating she wants to leave and we can not keep her here.  Discussed the need to stay in the hospital to get IV abx.  Pt inappropriate at times and agitated.  Called husband to discuss with him and he was upset as well, stating if she wants to come home, we can not keep her here.  Discussed importance of patient remaining to him, as well.  Husband stated he wanted to talk with the physician.  Notified Dr. Alfredia Ferguson and he called husband.

## 2019-04-18 NOTE — Progress Notes (Signed)
Patient started yelling at the top of her voice "Help!", "Help!" Staff went immediately to the room to help calm the patient down. The patient called her husband @ [phone], asking him to come pick her up and take her home.Staff tried to calm the patient, reassuring her she was in the hospital. PCP was notified

## 2019-04-18 NOTE — Progress Notes (Signed)
   [name]   1 Day Post-Op Procedure(s) (LRB): INCISION AND DRAINAGE Right arm (Right)  Subjective: The patient was confused overnight and agitated.  She was recently given Ativan and is now sleeping. cultures not speciated yet.  Objective:  Vitals:   04/17/19 2035 04/18/19 0502  BP: 126/71 120/66  Pulse: 84 85  Resp: 20 20  Temp: 98.1 F (36.7 C) 99.4 F (37.4 C)  SpO2: 99% 94%     She is resting, appears comfortable.  We rewrapped the right upper extremity as the dressing had moved down towards her wrist.  The drain appears to still be intact and is still putting out some seropurulent drainage.  Labs:  Recent Labs    04/15/19 1113 04/15/19 2152 04/16/19 0825 04/17/19 0158 04/18/19 0508  HGB 9.3* 9.2* 9.3* 9.0* 8.7*   Recent Labs    04/17/19 0158 04/18/19 0508  WBC 7.8 8.1  RBC 3.33* 3.26*  HCT 28.8* 28.0*  PLT 122* 159   Recent Labs    04/17/19 0158 04/18/19 0508  NA 128* 131*  K 4.3 3.7  CL 98 103  CO2 19* 20*  BUN 62* 58*  CREATININE 2.27* 1.92*  GLUCOSE 307* 172*  CALCIUM 7.9* 8.2*    Assessment and Plan:Postop day 1 status post I&D right septic elbow We will leave the drain in one more day. Antibiotics per ID, suspect MRSA. Once she is discharged he can follow up with me in about 2 weeks for suture removal.

## 2019-04-18 NOTE — Progress Notes (Signed)
Initial Nutrition Assessment  DOCUMENTATION CODES:   Obesity unspecified  INTERVENTION:   -Provide Ensure MAX Protein po BID, each supplement provides 150 kcal and 30 grams of protein -Provide Prostat liquid protein PO 30 ml BID with meals, each supplement provides 100 kcal, 15 grams protein.  NUTRITION DIAGNOSIS:   Increased nutrient needs related to wound healing as evidenced by estimated needs.  GOAL:   Patient will meet greater than or equal to 90% of their needs  MONITOR:   PO intake, Supplement acceptance, Labs, Weight trends, I & O's, Skin  REASON FOR ASSESSMENT:   Consult Assessment of nutrition requirement/status, Wound healing  ASSESSMENT:   63 y.o. female with medical history significant of coronary artery disease, hypertension, chronic pain syndrome, anxiety disorder, asthma, multiple skin ulcers, left foot ongoing also, right elbow surgery, chronic kidney disease stage III with history of transplant previously according to patient, diabetes and hypertension who presented to [facility] today complaining of fever chills as well as pain and swelling of the right elbow.  **RD working remotely**  Patient with some confusion. Pt with left diabetic foot ulcers. Would benefit from nutritional supplements, will order Ensure Max and Prostat supplements given increased needs for wound healing.  Per weight records, weight has increased since weight taken 6/15. Pt weighed 169 lbs then. Based on weight from 6/15, pt has lost 12 lbs since 1/28 (6% wt loss x 5 months, insignificant for time frame). Per I/O's: +1.5L since admit.  Medications: OSCAL w/ D tablet daily, Ferrous sulfate tablet daily, Multivitamin with minerals daily, K Phos tablet BID Labs reviewed: CBGs: 104-113 Low Na, Phos Mg WNL GFR: 27   NUTRITION - FOCUSED PHYSICAL EXAM:  Unable to perform per department requirement to work remotely.  Diet Order:   Diet Order            Diet regular Room  service appropriate? 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  Estimated Nutritional Needs:   Kcal:  1500-1700  Protein:  75-85g  Fluid:  1.7L/day  Clayton Bibles, MS, RD, LDN [facility] Pager: [phone] After Hours Pager: [phone]

## 2019-04-18 NOTE — Progress Notes (Signed)
PROGRESS NOTE    [name] [name]  CZY:[id number] DOB: 1956-07-28 DOA: 04/15/2019 PCP: Ann Held, DO  Brief Narrative:  HPI per Dr. Gala Romney on 04/15/2019 [name] [name] is a 63 y.o. female with medical history significant of coronary artery disease, hypertension, chronic pain syndrome, anxiety disorder, asthma, multiple skin ulcers, left foot ongoing also, right elbow surgery, chronic kidney disease stage III with history of transplant previously according to patient, diabetes and hypertension who presented to [facility] today complaining of fever chills as well as pain and swelling of the right elbow.  She reported falling and injuring the area about 3 days ago.  She was seen in the ER yesterday with unremarkable x-rays and patient was sent home.  Patient returned today with fever pain a 10 out of 10.  Denied any cough shortness of breath or dysuria.  Patient was noted to have significant swelling tenderness and redness of the right elbow.  She also had fever in the ER.  Additionally has acute on chronic kidney disease with possible UTI.  She has been admitted to the medical service for treatment.  She denied any other injury.  Denied any nausea vomiting or diarrhea..  ED Course: Temperature is 101.6 blood pressure 101/57, pulse 110 respiratory 24 oxygen sat 82% on room air currently at 100% on 2 L.  White count is 11,000 hemoglobin 9.2 and platelets of 113. Creatinine is 3.47.  Previous creatinine was 2.4.  Urinalysis showed poor collection but few bacteria.  RBC 21-50 squamous 11-20 but WBC also 21-50. X-ray of the right elbow yesterday showed possible effusion.  **Interim History IV fluids have been continued and creatinine is trending down.  Orthopedic surgery has been consulted and they did bedside I and D and they were concerned for Septic Arthritis so took the patient for ID in the OR 04/17/2019 and Cx's Sent and showing Staph Aureus with Sensitivites pending. ID  was consulted for further evaluation and recommendations and recommending continuing IV Vancomycin and likely needing a Central Line due to Chronic Renal Insuffiencey for long term Antibiotics.  Dr. Megan Salon recommending at least 2 weeks of antibiotic therapy and Dr. Lovena Le recommending continuing drain in place for 1 more night.  Overnight patient became confused and agitated and given Ativan.  Likely this is delirium secondary to her condition.  Assessment & Plan:   Principal Problem:   Septic arthritis of elbow, right (HCC) Active Problems:   Hyperlipidemia   THROMBOCYTOPENIA   Essential hypertension   Hepatic cirrhosis (HCC)   UTI   CAD (coronary artery disease)   Hyponatremia   Normocytic anemia   History of MI (myocardial infarction)   CKD (chronic kidney disease), stage III (HCC)   Neuropathy   Diabetic peripheral neuropathy (HCC)   Ulcer of left heel, limited to breakdown of skin ([place])   Type 2 diabetes mellitus with hyperglycemia, with long-term current use of insulin (HCC)   Sleep apnea   Acute on chronic renal insufficiency   Pressure injury of skin   Retinopathy   Renal transplant, status post  Acute on Chronic Kidney Injury Stage 3, improving Metabolic Acidosis -Patient has possible prerenal causes but likely in the setting of infection from elbow -Patient is DNR/creatinine went from 69/3.47 is now 58/1.92 -With her past history of chronic kidney disease as well as history of falls we will get renal ultrasound to rule out obstructive causes.   -In the meantime hydrate the patient and monitor renal function. -  Renal ultrasound showed no hydronephrosis but patient was having significant bladder retention -Continue bladder scans and in and out cath and if continues to drain will place a Foley catheter -BUN/creatinine is trending down with IV fluid hydration and will reduce rate to 75 mL/hr -Follow-up on urine culture -Strict I's and O's and daily weights -Continue to  Monitor and Trend Renal Fxn and repeat CMP in AM   Acute Urinary Retention -Continue with INO cath per hospital policy -If patient continues to not void will place Foley catheter -Will need to rebladder scan; IF retaining >250 will have Foley placed  Acute respiratory failure with hypoxia -Unclear etiology at this point -Chest x-ray showed no active disease repeat chest x-ray this morning again showed no active disease -Add flutter valve, incentive spirometry, guaifenesin 1200 g p.o. twice daily -Add duo nebs scheduled -Patient continues to be on 2 L of supplemental oxygen via nasal cannula will attempt to wean as tolerated -Will add Flutter Valve, Incentive Spirometry, and Guaifenesin  -Continue monitor and repeat CXR in AM .  Right elbow swelling and tenderness with concern for Cellulitis and now Septic Arthritis  -Elbow is warm to touch swollen and tender.   -Appears to have some effusion in there.   -Suspect posttraumatic versus septic elbow.   -WBC trended down from 11,000 now 7,800 -Continue to treat with Vanco and Levaquin but IV Levaquin has now been discontinued by infectious diseases -Orthopedic consult placed and Dr. [name] evaluated and did a bedside I&D but then took the patient to the OR this morning and felt the patient had septic arthritis; patient's elbow was debrided and infectious disease was consulted for further antibiotic management and a drain was placed in the patient's elbow and she will have surgery removal in 10 to 14 days -Initial synovial culture showed moderate gram-positive cocci but the cultures were too young to read so we will follow-up on susceptibilities; Cx growing Staph Aureus and Sensitivities pending  -CRP is 32.3 and ESR was 126 -Dr. [name] evaluated and recommends leaving a drain in 1 more day -Further management by ID and they are planning on long-term antibiotic treatment  Uncontrolled Diabetes Insulin Dependent  -Continue home regimen  with Lantus with  Moderate Novolog sliding scale insulin. -Blood sugars are currently uncontrolled and will need further adjustments -Pulmonary was he was 12.2  -Continue with Lantus 20 units in a.m. and 40 units nightly along with meal coverage with 4 units of NovoLog 3 times daily -CBGs have been ranging from 43-201 -Consulting Diabetes Education Coordinator will follow up with patient and patient will need stricter diabetic control  Hypertension -Blood pressure is elevated.  Resume home regimen and monitor closely and adjust as necessary.  Coronary artery disease -Stable appears to be at baseline.  Suspected UTI -Sample appears contaminated.   -May recollect urine.  -As above.  WBC trended down from 11,000 and is now 8,100 -Will empirically be on treatment. -Follow Culturesbut urine Cx never sent   Normocytic Anemia/Anemia of Chronic Kdney Disease -BUN/creatinine is currently stable at 9.3/20.5 and is now 8.7/28.0 -Check anemia panel in a.m. -Continue monitor for signs and symptoms of bleeding; no overt bleeding noted currently -Repeat CBC in a.m.  Hyponatremia -It appears to be chronic -Improved with IV fluid resuscitation initially but slightly dropped to 128 -Continue to monitor and trend and now sodium levels 128  Elevated Troponin -In setting of renal failure -Currently denies any chest pain -Continue monitor and trend and was trending downward and  went from 0.10 -> 0.15 -> 0.09 -> 0.09 -> 0.07  Obesity -Estimated body mass index is 36.61 kg/m as calculated from the following:   Height as of this encounter: 5' 2"  (1.575 m).   Weight as of this encounter: 90.8 kg. -Weight Loss and Dietary Counseling given   Hyperlipidemia -Continue Atorvastatin 10 mg p.o. nightly  Diabetic Foot Wound -She has a wound on the heel of her foot along with a wound on the big toe and fifth toe -Patient does have pulses to the left foot -We will check vascular ABI resting  ankle-brachial index was within normal limits on the right and the left resting ankle-brachial index is within normal range with no evidence of significant lower extremity arterial disease noted. -We will consult wound nurse -X-ray showed "Plantar foot ulcer. Negative for osteomyelitis. Postop resection posterior calcaneus." -We will need stricter control of her blood sugars -We will refer to vascular in outpatient setting -Patient is currently on antibiotics with IV vancomycin will continue -She follows up at the wound clinic as an outpatient -Peripheral vascular navigator contacted and will follow up with the patient  Hypophosphatemia -Patient's phosphorus level was 1.9 this morning -Replete with p.o. K-Phos Neutral 500 minutes p.o. twice daily x2 doses -Continue to monitor and replete as necessary -Repeat CMP in a.m.  DVT prophylaxis: Heparin 5000 units subcu every 8 Code Status: FULL CODE Family Communication: No family present at bedside updated husband over the telephone Disposition Plan: Pending further workup and treatment and will likely need a central line for long-term antibiotic management  Consultants:   Orthopedic Surgery   Infectious Diseases   Procedures: Renal U/S  Antimicrobials:  Anti-infectives (From admission, onward)   Start     Dose/Rate Route Frequency Ordered Stop   04/19/19 1800  vancomycin (VANCOCIN) IVPB 1000 mg/200 mL premix     1,000 mg 200 mL/hr over 60 Minutes Intravenous Every 48 hours 04/18/19 0840     04/17/19 2330  vancomycin (VANCOCIN) IVPB 750 mg/150 ml premix  Status:  Discontinued     750 mg 150 mL/hr over 60 Minutes Intravenous Every 48 hours 04/15/19 2329 04/18/19 0840   04/17/19 2200  levofloxacin (LEVAQUIN) tablet 500 mg  Status:  Discontinued     500 mg Oral Every 48 hours 04/15/19 2252 04/16/19 1020   04/17/19 1800  levofloxacin (LEVAQUIN) tablet 750 mg  Status:  Discontinued     750 mg Oral Every 48 hours 04/16/19 1020 04/17/19  1535   04/16/19 0000  vancomycin (VANCOCIN) 1,750 mg in sodium chloride 0.9 % 500 mL IVPB     1,750 mg 250 mL/hr over 120 Minutes Intravenous  Once 04/15/19 2319 04/16/19 0220   04/15/19 2200  levofloxacin (LEVAQUIN) IVPB 750 mg     750 mg 100 mL/hr over 90 Minutes Intravenous  Once 04/15/19 2142 04/15/19 2352     Subjective: Seen and examined at bedside and she was slightly drowsy today and intermittently confused likely with delirium.  No nausea or vomiting.  States that she is hurting today.  No other concerns or complaints at this time.  Objective: Vitals:   04/17/19 2035 04/18/19 0500 04/18/19 0502 04/18/19 1300  BP: 126/71  120/66 119/68  Pulse: 84  85 79  Resp: 20  20 16   Temp: 98.1 F (36.7 C)  99.4 F (37.4 C) 98.9 F (37.2 C)  TempSrc: Oral   Oral  SpO2: 99%  94% 97%  Weight:  90.8 kg    Height:  Intake/Output Summary (Last 24 hours) at 04/18/2019 1913 Last data filed at 04/18/2019 1700 Gross per 24 hour  Intake 2135.08 ml  Output 2570 ml  Net -434.92 ml   Filed Weights   04/15/19 2058 04/16/19 0528 04/18/19 0500  Weight: 91.5 kg 90.3 kg 90.8 kg   Examination: Physical Exam:  Constitutional: Well-nourished, well-developed obese Caucasian female currently no acute distress appears intermittently confused and delirious Eyes: Lids and conjunctive are normal.  Sclera anicteric ENMT: External ears and nose appear normal.  Grossly normal hearing Neck: Appears supple no JVD Respiratory: Slight diminished auscultation bilaterally no patient wheezing, rales, capital patient not tachypneic wheezing accessory muscle breathe but was wearing supplemental oxygen via nasal cannula Cardiovascular: Mildly tachycardic but regular rate.  Abdomen: Soft, nontender, distended second body habitus.  Bowel sounds present GU: Deferred Musculoskeletal: Has a partially amputated right foot, right elbow is dressed and has a dressing with a drain Skin: Diabetic foot wound on left  foot with a healing big toe along with the pin still.  No appreciable rashes or lesions on physical evaluation Neurologic: Cranial nerves II through XII grossly intact no appreciable focal deficits.  Romberg sign cerebellar flexible not assessed Psychiatric: Intermittently confused and agitated.  Slightly drowsy and somewhat anxious  Data Reviewed: I have personally reviewed following labs and imaging studies  CBC: Recent Labs  Lab 04/15/19 1113 04/15/19 2152 04/16/19 0825 04/17/19 0158 04/18/19 0508  WBC 9.9 11.0* 10.0 7.8 8.1  NEUTROABS  --   --  8.5* 6.6 6.3  HGB 9.3* 9.2* 9.3* 9.0* 8.7*  HCT 28.8* 28.9* 28.5* 28.8* 28.0*  MCV 84.0 85.8 85.6 86.5 85.9  PLT 112* 113* 120* 122* 466   Basic Metabolic Panel: Recent Labs  Lab 04/15/19 1113 04/15/19 2152 04/16/19 0825 04/17/19 0158 04/18/19 0508  NA 122*  --  129* 128* 131*  K 4.7  --  4.5 4.3 3.7  CL 90*  --  97* 98 103  CO2 21*  --  19* 19* 20*  GLUCOSE 333*  --  297* 307* 172*  BUN 69*  --  64* 62* 58*  CREATININE 3.47* 3.06* 2.64* 2.27* 1.92*  CALCIUM 8.0*  --  8.0* 7.9* 8.2*  MG  --   --  1.9 1.9 2.0  PHOS  --   --  3.2 2.5 1.9*   GFR: Estimated Creatinine Clearance: 31.4 mL/min (A) (by C-G formula based on SCr of 1.92 mg/dL (H)). Liver Function Tests: Recent Labs  Lab 04/15/19 1113 04/16/19 0825 04/17/19 0158 04/18/19 0508  AST 23 29 34 52*  ALT 20 21 24  32  ALKPHOS 73 72 78 82  BILITOT 1.1 1.0 0.8 0.8  PROT 7.2 6.9 6.3* 6.1*  ALBUMIN 3.0* 2.8* 2.4* 2.3*   No results for input(s): LIPASE, AMYLASE in the last 168 hours. No results for input(s): AMMONIA in the last 168 hours. Coagulation Profile: No results for input(s): INR, PROTIME in the last 168 hours. Cardiac Enzymes: Recent Labs  Lab 04/15/19 1113 04/15/19 1603 04/16/19 1956 04/17/19 0158 04/17/19 0827  TROPONINI 0.10* 0.15* 0.09* 0.09* 0.07*   BNP (last 3 results) No results for input(s): PROBNP in the last 8760 hours. HbA1C: Recent  Labs    04/17/19 0158  HGBA1C 12.2*   CBG: Recent Labs  Lab 04/17/19 1709 04/17/19 2104 04/18/19 0751 04/18/19 1155 04/18/19 1638  GLUCAP 201* 165* 113* 104* 84   Lipid Profile: No results for input(s): CHOL, HDL, LDLCALC, TRIG, CHOLHDL, LDLDIRECT in the last 72  hours. Thyroid Function Tests: No results for input(s): TSH, T4TOTAL, FREET4, T3FREE, THYROIDAB in the last 72 hours. Anemia Panel: No results for input(s): VITAMINB12, FOLATE, FERRITIN, TIBC, IRON, RETICCTPCT in the last 72 hours. Sepsis Labs: No results for input(s): PROCALCITON, LATICACIDVEN in the last 168 hours.  Recent Results (from the past 240 hour(s))  SARS Coronavirus 2 (Hosp order,Performed in [facility] lab via [employer] ID)     Status: None   Collection Time: 04/15/19 11:14 AM   Specimen: Dry Nasal Swab ([employer] ID Now)  Result Value Ref Range Status   SARS Coronavirus 2 ([employer] Now) NEGATIVE NEGATIVE Final    Comment: (NOTE) Interpretive Result Comment(s): COVID 19 Positive SARS CoV 2 target nucleic acids are DETECTED. The SARS CoV 2 RNA is generally detectable in upper and lower respiratory specimens during the acute phase of infection.  Positive results are indicative of active infection with SARS CoV 2.  Clinical correlation with patient history and other diagnostic information is necessary to determine patient infection status.  Positive results do not rule out bacterial infection or coinfection with other viruses. The expected result is Negative. COVID 19 Negative SARS CoV 2 target nucleic acids are NOT DETECTED. The SARS CoV 2 RNA is generally detectable in upper and lower respiratory specimens during the acute phase of infection.  Negative results do not preclude SARS CoV 2 infection, do not rule out coinfections with other pathogens, and should not be used as the sole basis for treatment or other patient management decisions.  Negative results must be combined with clinical  observations,  patient history, and epidemiological information. The expected result is Negative. Invalid Presence or absence of SARS CoV 2 nucleic acids cannot be determined. Repeat testing was performed on the submitted specimen and repeated Invalid results were obtained.  If clinically indicated, additional testing on a new specimen with an alternate test methodology [id number]) is advised.  The SARS CoV 2 RNA is generally detectable in upper and lower respiratory specimens during the acute phase of infection. The expected result is Negative. Fact Sheet for Patients:  GolfingFamily.no Fact Sheet for Healthcare Providers: https://www.hernandez-brewer.com/ This test is not yet approved or cleared by the [place] FDA and has been authorized for detection and/or diagnosis of SARS CoV 2 by FDA under an Emergency Use Authorization (EUA).  This EUA will remain in effect (meaning this test can be used) for the duration of the COVID19 d eclaration under Section 564(b)(1) of the Act, 21 U.S.C. section [phone] 3(b)(1), unless the authorization is terminated or revoked sooner. Performed at [facility], [address] [place], [place] [address]   Body fluid culture     Status: Abnormal (Preliminary result)   Collection Time: 04/16/19  9:00 PM   Specimen: Synovium; Body Fluid  Result Value Ref Range Status   Specimen Description   Final    SYNOVIAL RIGHT ELBOW Performed at [facility] [address] [place], [place] [address]    Special Requests   Final    Immunocompromised Performed at [facility], [place] [address], [place], [place] [address]    Gram Stain   Final    FEW WBC PRESENT, PREDOMINANTLY PMN MODERATE GRAM POSITIVE COCCI    Culture (A)  Final    STAPHYLOCOCCUS AUREUS SUSCEPTIBILITIES TO FOLLOW Performed at [facility], [place] [address], [place], [place] [address]    Report Status PENDING   Incomplete  MRSA PCR Screening  Status: None   Collection Time: 04/16/19 10:01 PM   Specimen: Nasal Mucosa; Nasopharyngeal  Result Value Ref Range Status   MRSA by PCR NEGATIVE NEGATIVE Final    Comment:        The GeneXpert MRSA Assay (FDA approved for NASAL specimens only), is one component of a comprehensive MRSA colonization surveillance program. It is not intended to diagnose MRSA infection nor to guide or monitor treatment for MRSA infections. Performed at [facility], [place] [address], [place], [place] [address]     Radiology Studies: Dg Chest Port 1 View  Result Date: 04/17/2019 CLINICAL DATA:  Shortness of breath. EXAM: PORTABLE CHEST 1 VIEW COMPARISON:  04/16/2019 and older exams. FINDINGS: Cardiac silhouette is normal in size. No mediastinal or hilar masses. No evidence of adenopathy. Clear lungs.  No pleural effusion or pneumothorax. Old proximal left humerus and left clavicle fractures, stable. IMPRESSION: No active disease. Electronically Signed   By: Lajean Manes M.D.   On: 04/17/2019 08:40   Dg Foot Complete Left  Result Date: 04/17/2019 CLINICAL DATA:  Diabetic foot ulcer EXAM: LEFT FOOT - COMPLETE 3+ VIEW COMPARISON:  04/06/2017 FINDINGS: Resection of the posterior calcaneus appears healed without evidence of acute osteomyelitis. Negative for fracture. No acute osteomyelitis elsewhere Plantar ulcer with soft tissue swelling has improved since the prior study. IMPRESSION: Plantar foot ulcer. Negative for osteomyelitis. Postop resection posterior calcaneus. Electronically Signed   By: Franchot Gallo M.D.   On: 04/17/2019 14:54   Vas [name] Burnard Bunting With/wo Tbi  Result Date: 04/17/2019 LOWER EXTREMITY DOPPLER STUDY Indications: Ulceration. High Risk Factors: Hypertension, Diabetes, coronary artery disease.  Comparison Study: Prior normal study from 04/08/17 is available for comparison. Performing Technologist: Sharion Dove RVS  Examination Guidelines: A  complete evaluation includes at minimum, Doppler waveform signals and systolic blood pressure reading at the level of bilateral brachial, anterior tibial, and posterior tibial arteries, when vessel segments are accessible. Bilateral testing is considered an integral part of a complete examination. Photoelectric Plethysmograph (PPG) waveforms and toe systolic pressure readings are included as required and additional duplex testing as needed. Limited examinations for reoccurring indications may be performed as noted.  ABI Findings: +---------+------------------+-----+--------+---------------------------------+  Right     Rt Pressure (mmHg) Index Waveform Comment                            +---------+------------------+-----+--------+---------------------------------+  Brachial                                    restricted, had elbow surgery                                                   today                              +---------+------------------+-----+--------+---------------------------------+  PTA       131                1.12  biphasic                                    +---------+------------------+-----+--------+---------------------------------+  DP        128                1.09  biphasic                                    +---------+------------------+-----+--------+---------------------------------+  Great Toe 93                 0.79                                              +---------+------------------+-----+--------+---------------------------------+ +---------+------------------+-----+---------+----------+  Left      Lt Pressure (mmHg) Index Waveform  Comment     +---------+------------------+-----+---------+----------+  Brachial  117                      triphasic             +---------+------------------+-----+---------+----------+  PTA       127                1.09  biphasic              +---------+------------------+-----+---------+----------+  DP        143                1.22  biphasic               +---------+------------------+-----+---------+----------+  Great Toe                                    ulceration  +---------+------------------+-----+---------+----------+ +-------+-----------+-----------+------------+------------+  ABI/TBI Today's ABI Today's TBI Previous ABI Previous TBI  +-------+-----------+-----------+------------+------------+  Right   1.1         0.79        1.1                        +-------+-----------+-----------+------------+------------+  Left    1.2                     1.1                        +-------+-----------+-----------+------------+------------+ Right ABIs appear essentially unchanged. Left ABIs appear essentially unchanged.  Summary: Right: Resting right ankle-brachial index is within normal range. No evidence of significant right lower extremity arterial disease. The right toe-brachial index is normal. Left: Resting left ankle-brachial index is within normal range. No evidence of significant left lower extremity arterial disease.  *See table(s) above for measurements and observations.  Electronically signed by Servando Snare MD on 04/17/2019 at 4:18:08 PM.    Final    Scheduled Meds:  aspirin EC  81 mg Oral BID   atorvastatin  10 mg Oral q1800   calcium-vitamin D  1 tablet Oral Daily   docusate sodium  100 mg Oral BID   feeding supplement (PRO-STAT SUGAR FREE 64)  30 mL Oral BID   ferrous sulfate  325 mg Oral Daily   guaiFENesin  1,200 mg Oral BID   heparin  5,000 Units Subcutaneous Q8H   insulin aspart  0-15 Units Subcutaneous TID WC   insulin aspart  0-5 Units Subcutaneous QHS  insulin aspart  4 Units Subcutaneous TID WC   insulin glargine  20 Units Subcutaneous Daily   insulin glargine  40 Units Subcutaneous QHS   multivitamin with minerals  1 tablet Oral Daily   nystatin   Topical TID   phosphorus  500 mg Oral BID   Ensure Max Protein  11 oz Oral BID   Continuous Infusions:  sodium chloride 75 mL/hr at 04/18/19 1511    [START ON 04/19/2019] vancomycin      LOS: 3 days   Kerney Elbe, DO [facility] PAGER is on AMION  If 7PM-7AM, please contact night-coverage [email or web address] Password [facility] 04/18/2019, 7:13 PM

## 2019-04-18 NOTE — Progress Notes (Signed)
Updated husband on patient.  More calm at present and not requesting to leave.  Husband thankful for phone call and apologized for previous discussion.

## 2019-04-18 NOTE — Progress Notes (Signed)
Pharmacy Antibiotic Note  [name] [name] is a 63 y.o. female admitted on 04/15/2019 with fall and arm pain.  Pharmacy has been consulted for levofloxacin dosing for UTI and vancomycin for cellulitis   Pt presents with chief complaint of right forearm pain s/p fall complaints of pain/swelling of elbow. Pt also presents with urinary retention and complains of dysuria.  Pharmacy consulted to dose levofloxacin and vancomycin for UTI and cellulitis. Pt has severe PCN allergy.  ID saw pt on 6/18 and stop levofloxacin as pt w/o complaints of UTI.    Today, 04/18/19  WBC normalized  CKD - SCr improving.  SCr from 02-2018 was ~1.6. Possible baseline?  Afeb  Culture of elbox - gram stain showing GPC  Plan:  Increase vancomycin to 1000 mg IV q48h for predicted AUC = 457 using SCr = 0.92  Goal AUC 400-550  Monitor renal function closely for any necessary dose adjustments. Daily SCr.  Check vancomycin levels once at steady state (or sooner) if indicated  Await culture  Height: 5' 2"  (157.5 cm) Weight: 200 lb 2.8 oz (90.8 kg) IBW/kg (Calculated) : 50.1  Temp (24hrs), Avg:98.2 F (36.8 C), Min:97.7 F (36.5 C), Max:99.4 F (37.4 C)  Recent Labs  Lab 04/15/19 1113 04/15/19 2152 04/16/19 0825 04/17/19 0158 04/18/19 0508  WBC 9.9 11.0* 10.0 7.8 8.1  CREATININE 3.47* 3.06* 2.64* 2.27* 1.92*    Estimated Creatinine Clearance: 31.4 mL/min (A) (by C-G formula based on SCr of 1.92 mg/dL (H)).    Allergies  Allergen Reactions  . Bee Pollen Anaphylaxis  . Fish-Derived Products Hives, Shortness Of Breath, Swelling and Rash    Hives get in throat causing trouble breathing  . Mushroom Extract Complex Anaphylaxis  . Penicillins Anaphylaxis    Did it involve swelling of the face/tongue/throat, SOB, or low BP? Yes Did it involve sudden or severe rash/hives, skin peeling, or any reaction on the inside of your mouth or nose? No Did you need to seek medical attention at a hospital or  doctor's office? Yes When did it last happen?A few months ago If all above answers are "NO", may proceed with cephalosporin use.  [name] Kitchen Rosemary Oil Anaphylaxis  . Shellfish Allergy Hives, Shortness Of Breath, Swelling and Rash  . Tomato Hives and Shortness Of Breath    Hives in throat causes her trouble breathing  . Acetaminophen Other (See Comments)    GI upset  . Acyclovir And Related Other (See Comments)    Unknown rxn  . Aloe Vera Hives  . Naproxen Other (See Comments)    Unknown rxn    Antimicrobials this admission: 6/16 Levaquin>> 6/18 6/16 vanc>>  Dose adjustments this admission:  Microbiology results: 6/16 SARS 2 negative 6/17 synovial elbow: GPC  No urine culture obtained on admission.  Thank you for allowing pharmacy to be a part of this patient's care.  Doreene Eland, PharmD, BCPS.   Work Cell: [phone] 04/18/2019 8:38 AM

## 2019-04-18 NOTE — Consult Note (Signed)
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 There is a healed incision on the right foot.  Followed by an outpatient wound care center in the community Wound type:Neuropathic Pressure Injury POA:NA Measurement: Left plantar heel:  3cm x 3cm x 0.2cm area of red with grated-appearing skin. Serous exudate in a small amount Left great toe:  Healing ulceration measuring 1cm round x 0.1cm. No drainage Left 5th digit:  Serosanguinous drainage has left a crust obscuring assessment. 1cm x 0.5cm linear break in the skin. Wound bed: As described above Drainage (amount, consistency, odor) As described above Periwound: intact, dry Dressing procedure/placement/frequency: I will implement conservative care orders using soap and water to cleanse, xeroform gauze as a wound contact layer topped with dry gauze and secured with Kerlix roll gauze/paper tape.  Daily changes. I will provide a pressure redistribution chair pad as she will be less mobile due to her recent surgery.  She should return to the care and oversight of the outpatient wound care center for her left foot needs post discharge.  [facility] nursing team will not follow, but will remain available to this patient, the nursing and medical teams.  Please re-consult if needed. Thanks, Maudie Flakes, MSN, RN, [place], [facility]  Pager# [phone]

## 2019-04-18 NOTE — Progress Notes (Signed)
[name], female   DOB: 03/05/1956, 63 y.o.   MRN: [id number]         [facility]  Date of Admission:  04/15/2019           Day 3 vancomycin ASSESSMENT: Her right elbow synovial fluid cultures have grown Staph aureus (not the methicillin-resistant coagulase-negative staph caused her infection in 2016).  Susceptibilities are pending.  I plan on continuing vancomycin even if it is methicillin susceptible given that she has a history of serious reaction while on cefazolin in 2016.  I recommend a minimum of 2 weeks of IV antibiotic therapy.  She may be able to get by with peripheral IVs if she remains in a supervised setting.  Otherwise she will need IR to place a central line.  PLAN: 1. Continue vancomycin 2. Please call Dr. Carlyle Basques [phone]) for any infectious disease questions this weekend  Principal Problem:   Septic arthritis of elbow, right ([place]) Active Problems:   Hyperlipidemia   THROMBOCYTOPENIA   Essential hypertension   Hepatic cirrhosis (HCC)   UTI   CAD (coronary artery disease)   Hyponatremia   Normocytic anemia   History of MI (myocardial infarction)   CKD (chronic kidney disease), stage III (HCC)   Neuropathy   Diabetic peripheral neuropathy (HCC)   Ulcer of left heel, limited to breakdown of skin ([place])   Type 2 diabetes mellitus with hyperglycemia, with long-term current use of insulin (HCC)   Sleep apnea   Acute on chronic renal insufficiency   Pressure injury of skin   Retinopathy   Renal transplant, status post   Scheduled Meds: . aspirin EC  81 mg Oral BID  . atorvastatin  10 mg Oral q1800  . calcium-vitamin D  1 tablet Oral Daily  . docusate sodium  100 mg Oral BID  . ferrous sulfate  325 mg Oral Daily  . guaiFENesin  1,200 mg Oral BID  . heparin  5,000 Units Subcutaneous Q8H  . insulin aspart  0-15 Units Subcutaneous TID WC  . insulin aspart  0-5 Units Subcutaneous QHS  . insulin aspart  4  Units Subcutaneous TID WC  . insulin glargine  20 Units Subcutaneous Daily  . insulin glargine  40 Units Subcutaneous QHS  . multivitamin with minerals  1 tablet Oral Daily  . nystatin   Topical TID  . phosphorus  500 mg Oral BID   Continuous Infusions: . sodium chloride 75 mL/hr at 04/18/19 0113  . [START ON 04/19/2019] vancomycin     PRN Meds:.calcium carbonate (dosed in mg elemental calcium), camphor-menthol **AND** hydrOXYzine, docusate sodium, feeding supplement (NEPRO CARB STEADY), HYDROcodone-acetaminophen, HYDROmorphone (DILAUDID) injection, ipratropium-albuterol, metoCLOPramide **OR** metoCLOPramide (REGLAN) injection, ondansetron **OR** ondansetron (ZOFRAN) IV, sorbitol, zolpidem   SUBJECTIVE: She says that her right elbow has been hurting her for 3 weeks and then immediately falls sound asleep.  Review of Systems: Review of Systems  Unable to perform ROS: Mental acuity    Allergies  Allergen Reactions  . Bee Pollen Anaphylaxis  . Fish-Derived Products Hives, Shortness Of Breath, Swelling and Rash    Hives get in throat causing trouble breathing  . Mushroom Extract Complex Anaphylaxis  . Penicillins Anaphylaxis    Did it involve swelling of the face/tongue/throat, SOB, or low BP? Yes Did it involve sudden or severe rash/hives, skin peeling, or any reaction on the inside of your mouth or nose? No Did you need to seek medical attention at a hospital  or doctor's office? Yes When did it last happen?A few months ago If all above answers are "NO", may proceed with cephalosporin use.  [name] Kitchen Rosemary Oil Anaphylaxis  . Shellfish Allergy Hives, Shortness Of Breath, Swelling and Rash  . Tomato Hives and Shortness Of Breath    Hives in throat causes her trouble breathing  . Acetaminophen Other (See Comments)    GI upset  . Acyclovir And Related Other (See Comments)    Unknown rxn  . Aloe Vera Hives  . Naproxen Other (See Comments)    Unknown rxn    OBJECTIVE: Vitals:    04/17/19 1900 04/17/19 2035 04/18/19 0500 04/18/19 0502  BP: (!) 104/59 126/71  120/66  Pulse: 77 84  85  Resp: 12 20  20   Temp: 98.1 F (36.7 C) 98.1 F (36.7 C)  99.4 F (37.4 C)  TempSrc: Oral Oral    SpO2: 96% 99%  94%  Weight:   90.8 kg   Height:       Body mass index is 36.61 kg/m.  Physical Exam Constitutional:      Comments: She is sitting up in a chair sound asleep.  Musculoskeletal:     Comments: Her right elbow is wrapped.     Lab Results Lab Results  Component Value Date   WBC 8.1 04/18/2019   HGB 8.7 (L) 04/18/2019   HCT 28.0 (L) 04/18/2019   MCV 85.9 04/18/2019   PLT 159 04/18/2019    Lab Results  Component Value Date   CREATININE 1.92 (H) 04/18/2019   BUN 58 (H) 04/18/2019   NA 131 (L) 04/18/2019   K 3.7 04/18/2019   CL 103 04/18/2019   CO2 20 (L) 04/18/2019    Lab Results  Component Value Date   ALT 32 04/18/2019   AST 52 (H) 04/18/2019   ALKPHOS 82 04/18/2019   BILITOT 0.8 04/18/2019     Microbiology: Recent Results (from the past 240 hour(s))  SARS Coronavirus 2 (Hosp order,Performed in [place] lab via [facility] ID)     Status: None   Collection Time: 04/15/19 11:14 AM   Specimen: Dry Nasal Swab ([facility] ID Now)  Result Value Ref Range Status   SARS Coronavirus 2 ([facility]ott ID Now) NEGATIVE NEGATIVE Final    Comment: (NOTE) Interpretive Result Comment(s): COVID 19 Positive SARS CoV 2 target nucleic acids are DETECTED. The SARS CoV 2 RNA is generally detectable in upper and lower respiratory specimens during the acute phase of infection.  Positive results are indicative of active infection with SARS CoV 2.  Clinical correlation with patient history and other diagnostic information is necessary to determine patient infection status.  Positive results do not rule out bacterial infection or coinfection with other viruses. The expected result is Negative. COVID 19 Negative SARS CoV 2 target nucleic acids are NOT DETECTED. The SARS CoV 2  RNA is generally detectable in upper and lower respiratory specimens during the acute phase of infection.  Negative results do not preclude SARS CoV 2 infection, do not rule out coinfections with other pathogens, and should not be used as the sole basis for treatment or other patient management decisions.  Negative results must be combined with clinical  observations, patient history, and epidemiological information. The expected result is Negative. Invalid Presence or absence of SARS CoV 2 nucleic acids cannot be determined. Repeat testing was performed on the submitted specimen and repeated Invalid results were obtained.  If clinically indicated, additional testing on a new specimen with an  alternate test methodology [phone]) is advised.  The SARS CoV 2 RNA is generally detectable in upper and lower respiratory specimens during the acute phase of infection. The expected result is Negative. Fact Sheet for Patients:  GolfingFamily.no Fact Sheet for Healthcare Providers: https://www.hernandez-brewer.com/ This test is not yet approved or cleared by the [place] FDA and has been authorized for detection and/or diagnosis of SARS CoV 2 by FDA under an Emergency Use Authorization (EUA).  This EUA will remain in effect (meaning this test can be used) for the duration of the COVID19 d eclaration under Section 564(b)(1) of the Act, 21 U.S.C. section [phone] 3(b)(1), unless the authorization is terminated or revoked sooner. Performed at [facility], [address] [place], [place] [address]   Body fluid culture     Status: Abnormal (Preliminary result)   Collection Time: 04/16/19  9:00 PM   Specimen: Synovium; Body Fluid  Result Value Ref Range Status   Specimen Description   Final    SYNOVIAL RIGHT ELBOW Performed at [place] [address] [place], [place] [address]    Special Requests   Final     Immunocompromised Performed at [facility], [place] [address], [place], [place] [address]    Gram Stain   Final    FEW WBC PRESENT, PREDOMINANTLY PMN MODERATE GRAM POSITIVE COCCI    Culture (A)  Final    STAPHYLOCOCCUS AUREUS SUSCEPTIBILITIES TO FOLLOW Performed at [facility], [place] [address], [place], [place] [address]    Report Status PENDING  Incomplete  MRSA PCR Screening     Status: None   Collection Time: 04/16/19 10:01 PM   Specimen: Nasal Mucosa; Nasopharyngeal  Result Value Ref Range Status   MRSA by PCR NEGATIVE NEGATIVE Final    Comment:        The GeneXpert MRSA Assay (FDA approved for NASAL specimens only), is one component of a comprehensive MRSA colonization surveillance program. It is not intended to diagnose MRSA infection nor to guide or monitor treatment for MRSA infections. Performed at [facility], [place] [address], [place], [place] [address]     Michel Bickers, [place] for Infectious [place] [phone] pager   [phone] cell 04/18/2019, 10:43 AM

## 2019-04-19 ENCOUNTER — Inpatient Hospital Stay (HOSPITAL_COMMUNITY): Payer: PRIVATE HEALTH INSURANCE

## 2019-04-19 DIAGNOSIS — R740 Nonspecific elevation of levels of transaminase and lactic acid dehydrogenase [LDH]: Secondary | ICD-10-CM

## 2019-04-19 LAB — MAGNESIUM: Magnesium: 1.9 mg/dL (ref 1.7–2.4)

## 2019-04-19 LAB — GLUCOSE, CAPILLARY
Glucose-Capillary: 75 mg/dL (ref 70–99)
Glucose-Capillary: 96 mg/dL (ref 70–99)
Glucose-Capillary: 139 mg/dL — ABNORMAL HIGH (ref 70–99)
Glucose-Capillary: 119 mg/dL — ABNORMAL HIGH (ref 70–99)

## 2019-04-19 LAB — CBC WITH DIFFERENTIAL/PLATELET
Abs Immature Granulocytes: 0.13 10*3/uL — ABNORMAL HIGH (ref 0.00–0.07)
Basophils Absolute: 0.1 10*3/uL (ref 0.0–0.1)
Basophils Relative: 1 %
Eosinophils Absolute: 0.1 10*3/uL (ref 0.0–0.5)
Eosinophils Relative: 2 %
HCT: 29.8 % — ABNORMAL LOW (ref 36.0–46.0)
Hemoglobin: 9.3 g/dL — ABNORMAL LOW (ref 12.0–15.0)
Immature Granulocytes: 2 %
Lymphocytes Relative: 11 %
Lymphs Abs: 1 10*3/uL (ref 0.7–4.0)
MCH: 27.1 pg (ref 26.0–34.0)
MCHC: 31.2 g/dL (ref 30.0–36.0)
MCV: 86.9 fL (ref 80.0–100.0)
Monocytes Absolute: 0.9 10*3/uL (ref 0.1–1.0)
Monocytes Relative: 11 %
Neutro Abs: 6.5 10*3/uL (ref 1.7–7.7)
Neutrophils Relative %: 73 %
Platelets: 230 10*3/uL (ref 150–400)
RBC: 3.43 MIL/uL — ABNORMAL LOW (ref 3.87–5.11)
RDW: 15.9 % — ABNORMAL HIGH (ref 11.5–15.5)
WBC: 8.7 10*3/uL (ref 4.0–10.5)
nRBC: 0 % (ref 0.0–0.2)

## 2019-04-19 LAB — BODY FLUID CULTURE

## 2019-04-19 LAB — FERRITIN: Ferritin: 415 ng/mL — ABNORMAL HIGH (ref 11–307)

## 2019-04-19 LAB — IRON AND TIBC
Iron: 25 ug/dL — ABNORMAL LOW (ref 28–170)
Saturation Ratios: 15 % (ref 10.4–31.8)
TIBC: 167 ug/dL — ABNORMAL LOW (ref 250–450)
UIBC: 142 ug/dL

## 2019-04-19 LAB — COMPREHENSIVE METABOLIC PANEL
ALT: 33 U/L (ref 0–44)
AST: 57 U/L — ABNORMAL HIGH (ref 15–41)
Albumin: 2.2 g/dL — ABNORMAL LOW (ref 3.5–5.0)
Alkaline Phosphatase: 95 U/L (ref 38–126)
Anion gap: 9 (ref 5–15)
BUN: 46 mg/dL — ABNORMAL HIGH (ref 8–23)
CO2: 20 mmol/L — ABNORMAL LOW (ref 22–32)
Calcium: 8.2 mg/dL — ABNORMAL LOW (ref 8.9–10.3)
Chloride: 108 mmol/L (ref 98–111)
Creatinine, Ser: 1.48 mg/dL — ABNORMAL HIGH (ref 0.44–1.00)
GFR calc Af Amer: 43 mL/min — ABNORMAL LOW (ref >60)
GFR calc non Af Amer: 37 mL/min — ABNORMAL LOW (ref >60)
Glucose, Bld: 100 mg/dL — ABNORMAL HIGH (ref 70–99)
Potassium: 3.8 mmol/L (ref 3.5–5.1)
Sodium: 137 mmol/L (ref 135–145)
Total Bilirubin: 0.6 mg/dL (ref 0.3–1.2)
Total Protein: 6.1 g/dL — ABNORMAL LOW (ref 6.5–8.1)

## 2019-04-19 LAB — RETICULOCYTES
Immature Retic Fract: 17.9 % — ABNORMAL HIGH (ref 2.3–15.9)
RBC.: 3.43 MIL/uL — ABNORMAL LOW (ref 3.87–5.11)
Retic Count, Absolute: 87.1 10*3/uL (ref 19.0–186.0)
Retic Ct Pct: 2.5 % (ref 0.4–3.1)

## 2019-04-19 LAB — VITAMIN B12: Vitamin B-12: 1308 pg/mL — ABNORMAL HIGH (ref 180–914)

## 2019-04-19 LAB — PROTIME-INR
INR: 1.1 (ref 0.8–1.2)
Prothrombin Time: 13.9 seconds (ref 11.4–15.2)

## 2019-04-19 LAB — PHOSPHORUS: Phosphorus: 2.8 mg/dL (ref 2.5–4.6)

## 2019-04-19 LAB — FOLATE: Folate: 12.9 ng/mL (ref >5.9)

## 2019-04-19 MED ORDER — POLYETHYLENE GLYCOL 3350 17 G PO PACK: 17.0000 g | PACK | Freq: Every day | ORAL | Status: DC | PRN

## 2019-04-19 MED ORDER — VANCOMYCIN HCL IN DEXTROSE 1-5 GM/200ML-% IV SOLN
1000.0000 mg | INTRAVENOUS | Status: DC
  Administered 2019-04-19: 1000 mg via INTRAVENOUS
  Filled 2019-04-19: qty 200

## 2019-04-19 MED ORDER — HYDRALAZINE HCL 20 MG/ML IJ SOLN: 5.0000 mg | Freq: Four times a day (QID) | INTRAMUSCULAR | Status: DC | PRN

## 2019-04-19 NOTE — Progress Notes (Signed)
Pt had measurement of 25ms  post void x1 this shift.

## 2019-04-19 NOTE — Progress Notes (Signed)
   [name]   2 Day Post-Op Procedure(s) (LRB): INCISION AND DRAINAGE Right arm (Right)  Subjective: The patient was a bit less confused overnight.  She reports minimal pain at elbow but states it feels "heavy." She desires to go home but seems confused about ID recs. Eating well.   Objective:  BP (!) 152/75 (BP Location: Left Arm)   Pulse 79   Temp 98.3 F (36.8 C) (Oral)   Resp 18   Ht 5' 2"  (1.575 m)   Wt 91.8 kg   SpO2 95%   BMI 37.02 kg/m    She is eating, appears comfortable.  Ace wrap and drain in place.  The drain appears to still be intact and is still putting out some seropurulent drainage. Approx 35 over last 12 hours.  Labs:  CBC Latest Ref Rng & Units 04/19/2019 04/18/2019 04/17/2019  WBC 4.0 - 10.5 K/uL 8.7 8.1 7.8  Hemoglobin 12.0 - 15.0 g/dL 9.3(L) 8.7(L) 9.0(L)  Hematocrit 36.0 - 46.0 % 29.8(L) 28.0(L) 28.8(L)  Platelets 150 - 400 K/uL 230 159 122(L)   CMP Latest Ref Rng & Units 04/19/2019 04/18/2019 04/17/2019  Glucose 70 - 99 mg/dL 100(H) 172(H) 307(H)  BUN 8 - 23 mg/dL 46(H) 58(H) 62(H)  Creatinine 0.44 - 1.00 mg/dL 1.48(H) 1.92(H) 2.27(H)  Sodium 135 - 145 mmol/L 137 131(L) 128(L)  Potassium 3.5 - 5.1 mmol/L 3.8 3.7 4.3  Chloride 98 - 111 mmol/L 108 103 98  CO2 22 - 32 mmol/L 20(L) 20(L) 19(L)  Calcium 8.9 - 10.3 mg/dL 8.2(L) 8.2(L) 7.9(L)  Total Protein 6.5 - 8.1 g/dL 6.1(L) 6.1(L) 6.3(L)  Total Bilirubin 0.3 - 1.2 mg/dL 0.6 0.8 0.8  Alkaline Phos 38 - 126 U/L 95 82 78  AST 15 - 41 U/L 57(H) 52(H) 34  ALT 0 - 44 U/L 33 32 24   .I/O last 3 completed shifts: In: 3644.4 [P.O.:600; I.V.:2864.4; Other:30; IV Piggyback:150] Out: 3400 [Urine:3350; Drains:50]  Assessment and Plan:Postop day 2 status post I&D right septic elbow We will leave the drain in one more day as continues to put out (35 over last 12hrs). Antibiotics per ID Once she is discharged she can follow up with Dr [name] in about 2 weeks for suture removal

## 2019-04-19 NOTE — Progress Notes (Signed)
PROGRESS NOTE    [name] [name]  PTW:[id number] DOB: 11/15/1955 DOA: 04/15/2019 PCP: Ann Held, DO  Brief Narrative:  HPI per Dr. Gala Romney on 04/15/2019 [name] [name] is a 63 y.o. female with medical history significant of coronary artery disease, hypertension, chronic pain syndrome, anxiety disorder, asthma, multiple skin ulcers, left foot ongoing also, right elbow surgery, chronic kidney disease stage III with history of transplant previously according to patient, diabetes and hypertension who presented to [facility] today complaining of fever chills as well as pain and swelling of the right elbow.  She reported falling and injuring the area about 3 days ago.  She was seen in the ER yesterday with unremarkable x-rays and patient was sent home.  Patient returned today with fever pain a 10 out of 10.  Denied any cough shortness of breath or dysuria.  Patient was noted to have significant swelling tenderness and redness of the right elbow.  She also had fever in the ER.  Additionally has acute on chronic kidney disease with possible UTI.  She has been admitted to the medical service for treatment.  She denied any other injury.  Denied any nausea vomiting or diarrhea..  ED Course: Temperature is 101.6 blood pressure 101/57, pulse 110 respiratory 24 oxygen sat 82% on room air currently at 100% on 2 L.  White count is 11,000 hemoglobin 9.2 and platelets of 113. Creatinine is 3.47.  Previous creatinine was 2.4.  Urinalysis showed poor collection but few bacteria.  RBC 21-50 squamous 11-20 but WBC also 21-50. X-ray of the right elbow yesterday showed possible effusion.  **Interim History IV fluids have been continued and creatinine is trending down and improving.  Orthopedic surgery has been consulted and they did bedside I and D and they were concerned for Septic Arthritis so took the patient for ID in the OR 04/17/2019 and Cx's Sent and showing Staph Aureus that is  Pan-Sensitive. ID was consulted for further evaluation and recommendations and recommending continuing IV Vancomycin due to Cefazolin allergy and likely needing a Central Line due to Chronic Renal Insuffiencey for long term Antibiotics.  Dr. Megan Salon recommending at least 2 weeks of antibiotic therapy and Orthopedics recommending continuing drain one more day. Overnight patient became confused and agitated and given Ativan.  Likely this is delirium secondary to her condition seems to be improving.  I have consulted IR for a tunneled CVC placement and this will be done 04/21/2019 per my discussion with the IR PA.   Assessment & Plan:   Principal Problem:   Septic arthritis of elbow, right (HCC) Active Problems:   Hyperlipidemia   THROMBOCYTOPENIA   Essential hypertension   Hepatic cirrhosis (HCC)   UTI   CAD (coronary artery disease)   Hyponatremia   Normocytic anemia   History of MI (myocardial infarction)   CKD (chronic kidney disease), stage III (HCC)   Neuropathy   Diabetic peripheral neuropathy (HCC)   Ulcer of left heel, limited to breakdown of skin ([place])   Type 2 diabetes mellitus with hyperglycemia, with long-term current use of insulin (HCC)   Sleep apnea   Acute on chronic renal insufficiency   Pressure injury of skin   Retinopathy   Renal transplant, status post  Acute on Chronic Kidney Injury Stage 3, improving Metabolic Acidosis -Patient has possible prerenal causes but likely in the setting of infection from elbow -Patient is DNR/creatinine went from 69/3.47 is now 46/1.48 -With her past history of chronic kidney  disease as well as history of falls we will get renal ultrasound to rule out obstructive causes.   -In the meantime hydrate the patient and monitor renal function. -Renal ultrasound showed no hydronephrosis but patient was having significant bladder retention -Continue bladder scans and in and out cath and if continues to drain will place a Foley  catheter -BUN/creatinine is trending down with IV fluid hydration and will reduce rate to 75 mL/hr and will continue but reduce rate to 50 mL/hr -Avoiding Nephrotoxic medications and holding Hom Naproxen, Pregabalin and HCTZ -Follow-up on urine culture -Strict I's and O's and daily weights -Continue to Monitor and Trend Renal Fxn and repeat CMP in AM   Acute Urinary Retention, improving -Continue with INO cath per hospital policy -If patient continues to not void will place Foley catheter -Will need to rebladder scan; IF retaining >250 will have Foley placed -Currently voiding independently but will check a PVR after she has voided  Acute respiratory failure with Hypoxia -Unclear etiology at this point -Repeat CXR this AM showed "Stable cardiomediastinal silhouette with normal heart size. No pneumothorax. No pleural effusion. Lungs appear clear, with no acute consolidative airspace disease and no pulmonary edema." -Add flutter valve, incentive spirometry, guaifenesin 1200 g p.o. twice daily -Add duo nebs scheduled -Patient continues to be on 2 L of supplemental oxygen via nasal cannula will attempt to wean as tolerated -Will add Flutter Valve, Incentive Spirometry, and Guaifenesin  -Continue monitor and repeat CXR in AM  Right elbow swelling and tenderness with concern for Cellulitis and now Septic Arthritis  -Elbow is warm to touch swollen and tender.   -Appears to have some effusion in there.   -Suspect posttraumatic versus septic elbow.   -WBC trended down from 11,000 now 8,700 -Continue to treat with Vanco and Levaquin but IV Levaquin has now been discontinued by infectious diseases -Orthopedic consult placed and Dr. [name] evaluated and did a bedside I&D but then took the patient to the OR 04/17/2019 and felt the patient had septic arthritis; patient's elbow was debrided and infectious disease was consulted for further antibiotic management and a drain was placed in the patient's  elbow and she will have surgery removal in 10 to 14 days -Initial synovial culture showed moderate gram-positive cocci but the cultures were too young to read so we will follow-up on susceptibilities; Cx growing Staph Aureus that is Pan-Sensitive  -CRP is 32.3 and ESR was 126 -Orthopedics evaluated and recommends leaving a drain in 1 more day as she continues to put out (35 mL in the last 12 hours) -Further management by ID and they are planning on long-term antibiotic treatment  Uncontrolled Diabetes Insulin Dependent  -Continue home regimen with Lantus with  Moderate Novolog sliding scale insulin. -Blood sugars are currently uncontrolled and will need further adjustments -Pulmonary was he was 12.2  -Continue with Lantus 20 units in a.m. and 40 units nightly along with meal coverage with 4 units of NovoLog 3 times daily -CBGs have been ranging from 75-113 -Consulting Diabetes Education Coordinator will follow up with patient and patient will need stricter diabetic control  Hypertension -Blood pressure is elevated.   -Home regimen with HCTZ currently being held and monitor closely and adjust as necessary. -Will add IV Hydralazine 5 mg q6hprn for SBP>170 or DBP>95 -BP is now 171/78  Coronary artery disease -Stable appears to be at baseline.  Suspected UTI -Sample appears contaminated.   -May recollect urine.  -As above.  WBC trended down from 11,000  and is now 8,700 -Will empirically be on treatment and Gram Negative Coverage discontinued -Follow Culturesbut urine Cx never sent   Normocytic Anemia/Anemia of Chronic Kdney Disease -BUN/creatinine is currently stable at 9.3/29.8 -Anemia panel done and showed an iron level of 25, U IBC 142, TIBC 167, saturation ratios of 15%, ferritin level 415, folate level 12.9, and vitamin B12 level of 1380 -Continue monitor for signs and symptoms of bleeding; no overt bleeding noted currently -Repeat CBC in a.m.  Hyponatremia, improving -It  appears to be chronic is now improving with IV fluid resuscitation -Improved with IV fluid resuscitation initially but slightly dropped but is now improved to 137 -Continue to monitor and trend and follow sodium levels  Elevated Troponin -In setting of renal failure -Currently denies any chest pain -Continue monitor and trend and was trending downward and went from 0.10 -> 0.15 -> 0.09 -> 0.09 -> 0.07  Obesity -Estimated body mass index is 37.02 kg/m as calculated from the following:   Height as of this encounter: 5' 2"  (1.575 m).   Weight as of this encounter: 91.8 kg. -Weight Loss and Dietary Counseling given   Hyperlipidemia -Continue Atorvastatin 10 mg p.o. nightly  Diabetic Foot Wound -She has a wound on the heel of her foot along with a wound on the big toe and fifth toe -Patient does have pulses to the left foot -We will check vascular ABI resting ankle-brachial index was within normal limits on the right and the left resting ankle-brachial index is within normal range with no evidence of significant lower extremity arterial disease noted. -We will consult wound nurse -X-ray showed "Plantar foot ulcer. Negative for osteomyelitis. Postop resection posterior calcaneus." -We will need stricter control of her blood sugars -We will refer to vascular in outpatient setting -Patient is currently on antibiotics with IV vancomycin will continue -She follows up at the wound clinic as an outpatient -Peripheral vascular navigator contacted and will follow up with the patient  Hypophosphatemia -Patient's phosphorus level was 2.8 this morning -Continue to monitor and replete as necessary -Repeat CMP in a.m.  Abnormal AST -Patient's AST is trending upwards and is now 57 Plan continue monitor and trend -If consistently elevated or worsening will check acute hepatitis panel and right upper quadrant ultrasound -Repeat CMP in a.m.  DVT prophylaxis: Heparin 5000 units subcu every 8 Code  Status: FULL CODE Family Communication: No family present at bedside updated husband over the telephone Disposition Plan: Pending further workup and treatment and will likely need a central line for long-term antibiotic management E[place] will be done Monday.  Anticipating discharge after central line is placed; will need to wean oxygen as tolerated  Consultants:   Orthopedic Surgery   Infectious Diseases   Procedures: Renal U/S  Antimicrobials:  Anti-infectives (From admission, onward)   Start     Dose/Rate Route Frequency Ordered Stop   04/19/19 1800  vancomycin (VANCOCIN) IVPB 1000 mg/200 mL premix  Status:  Discontinued     1,000 mg 200 mL/hr over 60 Minutes Intravenous Every 48 hours 04/18/19 0840 04/19/19 1129   04/19/19 1200  vancomycin (VANCOCIN) IVPB 1000 mg/200 mL premix     1,000 mg 200 mL/hr over 60 Minutes Intravenous Every 36 hours 04/19/19 1158     04/17/19 2330  vancomycin (VANCOCIN) IVPB 750 mg/150 ml premix  Status:  Discontinued     750 mg 150 mL/hr over 60 Minutes Intravenous Every 48 hours 04/15/19 2329 04/18/19 0840   04/17/19 2200  levofloxacin (LEVAQUIN) tablet  500 mg  Status:  Discontinued     500 mg Oral Every 48 hours 04/15/19 2252 04/16/19 1020   04/17/19 1800  levofloxacin (LEVAQUIN) tablet 750 mg  Status:  Discontinued     750 mg Oral Every 48 hours 04/16/19 1020 04/17/19 1535   04/16/19 0000  vancomycin (VANCOCIN) 1,750 mg in sodium chloride 0.9 % 500 mL IVPB     1,750 mg 250 mL/hr over 120 Minutes Intravenous  Once 04/15/19 2319 04/16/19 0220   04/15/19 2200  levofloxacin (LEVAQUIN) IVPB 750 mg     750 mg 100 mL/hr over 90 Minutes Intravenous  Once 04/15/19 2142 04/15/19 2352     Subjective: Seen and examined at bedside and she was calmer today but still complaining of some pain in her right arm.  No nausea or vomiting.  Denies chest pain, lightheadedness or dizziness.  Not as confused this morning.  No other concerns at present  time.  Objective: Vitals:   04/18/19 2104 04/19/19 0528 04/19/19 0536 04/19/19 1343  BP: (!) 147/66  (!) 152/75 (!) 171/78  Pulse: 79  79 89  Resp: 18  18 18   Temp: 98.8 F (37.1 C)  98.3 F (36.8 C) (!) 97.5 F (36.4 C)  TempSrc: Oral  Oral Oral  SpO2: 100%  95% 97%  Weight:  91.8 kg    Height:        Intake/Output Summary (Last 24 hours) at 04/19/2019 1354 Last data filed at 04/19/2019 1100 Gross per 24 hour  Intake 1949.36 ml  Output 1080 ml  Net 869.36 ml   Filed Weights   04/16/19 0528 04/18/19 0500 04/19/19 0528  Weight: 90.3 kg 90.8 kg 91.8 kg   Examination: Physical Exam:  Constitutional: Well-nourished, well-developed obese Caucasian female currently no acute distress and appears more calmer today and not as confused but is still a little anxious and complaining of some elbow pain Eyes: Sclera anicteric.  Lids and conjunctive are normal ENMT: External ears and nose appear normal.  Grossly normal hearing Neck: Appears supple no JVD Respiratory: Slightly diminished auscultation bilaterally no appreciable wheezing, rales, rhonchi.  Patient is not tachypneic or using any accessory muscles to breathe but is wearing supplemental oxygen via nasal cannula 2 L and will attempt to wean as tolerated Cardiovascular: Regular rate and rhythm.  Has no real appreciable murmurs, rubs, gallops Abdomen: Soft, nontender, distended second body habitus.  Bowel sounds present GU: Deferred Musculoskeletal: Has a partial right foot amputation.  Right elbow is dressed and has a dressing with a drain in Skin: Skin is warm dry no appreciable rashes but does have a diabetic foot wound on her heel on the left foot along with a wound on the big toe and pinky toe of the left foot Neurologic: Cranial nerves II through XII grossly intact with no appreciable focal deficits.  Romberg sign and cerebellar reflexes were not assessed Psychiatric: Confused today but a little drowsy again.  Mildly  anxious  Data Reviewed: I have personally reviewed following labs and imaging studies  CBC: Recent Labs  Lab 04/15/19 2152 04/16/19 0825 04/17/19 0158 04/18/19 0508 04/19/19 0535  WBC 11.0* 10.0 7.8 8.1 8.7  NEUTROABS  --  8.5* 6.6 6.3 6.5  HGB 9.2* 9.3* 9.0* 8.7* 9.3*  HCT 28.9* 28.5* 28.8* 28.0* 29.8*  MCV 85.8 85.6 86.5 85.9 86.9  PLT 113* 120* 122* 159 093   Basic Metabolic Panel: Recent Labs  Lab 04/15/19 1113 04/15/19 2152 04/16/19 0825 04/17/19 0158 04/18/19 0508 04/19/19  0535  NA 122*  --  129* 128* 131* 137  K 4.7  --  4.5 4.3 3.7 3.8  CL 90*  --  97* 98 103 108  CO2 21*  --  19* 19* 20* 20*  GLUCOSE 333*  --  297* 307* 172* 100*  BUN 69*  --  64* 62* 58* 46*  CREATININE 3.47* 3.06* 2.64* 2.27* 1.92* 1.48*  CALCIUM 8.0*  --  8.0* 7.9* 8.2* 8.2*  MG  --   --  1.9 1.9 2.0 1.9  PHOS  --   --  3.2 2.5 1.9* 2.8   GFR: Estimated Creatinine Clearance: 41 mL/min (A) (by C-G formula based on SCr of 1.48 mg/dL (H)). Liver Function Tests: Recent Labs  Lab 04/15/19 1113 04/16/19 0825 04/17/19 0158 04/18/19 0508 04/19/19 0535  AST 23 29 34 52* 57*  ALT 20 21 24  32 33  ALKPHOS 73 72 78 82 95  BILITOT 1.1 1.0 0.8 0.8 0.6  PROT 7.2 6.9 6.3* 6.1* 6.1*  ALBUMIN 3.0* 2.8* 2.4* 2.3* 2.2*   No results for input(s): LIPASE, AMYLASE in the last 168 hours. No results for input(s): AMMONIA in the last 168 hours. Coagulation Profile: Recent Labs  Lab 04/19/19 0854  INR 1.1   Cardiac Enzymes: Recent Labs  Lab 04/15/19 1113 04/15/19 1603 04/16/19 1956 04/17/19 0158 04/17/19 0827  TROPONINI 0.10* 0.15* 0.09* 0.09* 0.07*   BNP (last 3 results) No results for input(s): PROBNP in the last 8760 hours. HbA1C: Recent Labs    04/17/19 0158  HGBA1C 12.2*   CBG: Recent Labs  Lab 04/18/19 1155 04/18/19 1638 04/18/19 2114 04/19/19 0734 04/19/19 1121  GLUCAP 104* 84 96 75 96   Lipid Profile: No results for input(s): CHOL, HDL, LDLCALC, TRIG, CHOLHDL,  LDLDIRECT in the last 72 hours. Thyroid Function Tests: No results for input(s): TSH, T4TOTAL, FREET4, T3FREE, THYROIDAB in the last 72 hours. Anemia Panel: Recent Labs    04/19/19 0535  VITAMINB12 1,308*  FOLATE 12.9  FERRITIN 415*  TIBC 167*  IRON 25*  RETICCTPCT 2.5   Sepsis Labs: No results for input(s): PROCALCITON, LATICACIDVEN in the last 168 hours.  Recent Results (from the past 240 hour(s))  SARS Coronavirus 2 (Hosp order,Performed in [facility] lab via [employer] [facility])     Status: None   Collection Time: 04/15/19 11:14 AM   Specimen: Dry Nasal Swab ([employer] [facility] Now)  Result Value Ref Range Status   SARS Coronavirus 2 ([employer] Now) NEGATIVE NEGATIVE Final    Comment: (NOTE) Interpretive Result Comment(s): COVID 19 Positive SARS CoV 2 target nucleic acids are DETECTED. The SARS CoV 2 RNA is generally detectable in upper and lower respiratory specimens during the acute phase of infection.  Positive results are indicative of active infection with SARS CoV 2.  Clinical correlation with patient history and other diagnostic information is necessary to determine patient infection status.  Positive results do not rule out bacterial infection or coinfection with other viruses. The expected result is Negative. COVID 19 Negative SARS CoV 2 target nucleic acids are NOT DETECTED. The SARS CoV 2 RNA is generally detectable in upper and lower respiratory specimens during the acute phase of infection.  Negative results do not preclude SARS CoV 2 infection, do not rule out coinfections with other pathogens, and should not be used as the sole basis for treatment or other patient management decisions.  Negative results must be combined with clinical  observations, patient history, and epidemiological information. The expected  result is Negative. Invalid Presence or absence of SARS CoV 2 nucleic acids cannot be determined. Repeat testing was performed on the submitted specimen and  repeated Invalid results were obtained.  If clinically indicated, additional testing on a new specimen with an alternate test methodology [phone]) is advised.  The SARS CoV 2 RNA is generally detectable in upper and lower respiratory specimens during the acute phase of infection. The expected result is Negative. Fact Sheet for Patients:  GolfingFamily.no Fact Sheet for Healthcare Providers: https://www.hernandez-brewer.com/ This test is not yet approved or cleared by the [place] FDA and has been authorized for detection and/or diagnosis of SARS CoV 2 by FDA under an Emergency Use Authorization (EUA).  This EUA will remain in effect (meaning this test can be used) for the duration of the COVID19 d eclaration under Section 564(b)(1) of the Act, 21 U.S.C. section [phone] 3(b)(1), unless the authorization is terminated or revoked sooner. Performed at [facility], [address] [place], [place] [address]   Body fluid culture     Status: Abnormal   Collection Time: 04/16/19  9:00 PM   Specimen: Synovium; Body Fluid  Result Value Ref Range Status   Specimen Description   Final    SYNOVIAL RIGHT ELBOW Performed at [facility] [address] [place], [place] [address]    Special Requests   Final    Immunocompromised Performed at [facility], [place] [address], [place], [place] [address]    Gram Stain   Final    FEW WBC PRESENT, PREDOMINANTLY PMN MODERATE GRAM POSITIVE COCCI Performed at [facility], [place] [address] [place], [place] [address]    Culture STAPHYLOCOCCUS AUREUS (A)  Final   Report Status 04/19/2019 FINAL  Final   Organism ID, Bacteria STAPHYLOCOCCUS AUREUS  Final      Susceptibility   Staphylococcus aureus - MIC*    CIPROFLOXACIN <=0.5 SENSITIVE Sensitive     ERYTHROMYCIN 0.5 SENSITIVE Sensitive     GENTAMICIN <=0.5 SENSITIVE Sensitive     OXACILLIN 0.5 SENSITIVE  Sensitive     TETRACYCLINE <=1 SENSITIVE Sensitive     VANCOMYCIN 1 SENSITIVE Sensitive     TRIMETH/SULFA <=10 SENSITIVE Sensitive     CLINDAMYCIN <=0.25 SENSITIVE Sensitive     RIFAMPIN <=0.5 SENSITIVE Sensitive     Inducible Clindamycin NEGATIVE Sensitive     * STAPHYLOCOCCUS AUREUS  MRSA PCR Screening     Status: None   Collection Time: 04/16/19 10:01 PM   Specimen: Nasal Mucosa; Nasopharyngeal  Result Value Ref Range Status   MRSA by PCR NEGATIVE NEGATIVE Final    Comment:        The GeneXpert MRSA Assay (FDA approved for NASAL specimens only), is one component of a comprehensive MRSA colonization surveillance program. It is not intended to diagnose MRSA infection nor to guide or monitor treatment for MRSA infections. Performed at [facility], [place] [address], [place], [place] [address]     Radiology Studies: Dg Chest Port 1 View  Result Date: 04/19/2019 CLINICAL DATA:  Dyspnea EXAM: PORTABLE CHEST 1 VIEW COMPARISON:  04/17/2019 chest radiograph. FINDINGS: Stable cardiomediastinal silhouette with normal heart size. No pneumothorax. No pleural effusion. Lungs appear clear, with no acute consolidative airspace disease and no pulmonary edema. IMPRESSION: No active disease. Electronically Signed   By: Ilona Sorrel M.D.   On: 04/19/2019 06:33   Vas [name] Burnard Bunting With/wo Tbi  Result Date: 04/17/2019 LOWER EXTREMITY DOPPLER STUDY  Indications: Ulceration. High Risk Factors: Hypertension, Diabetes, coronary artery disease.  Comparison Study: Prior normal study from 04/08/17 is available for comparison. Performing Technologist: Sharion Dove RVS  Examination Guidelines: A complete evaluation includes at minimum, Doppler waveform signals and systolic blood pressure reading at the level of bilateral brachial, anterior tibial, and posterior tibial arteries, when vessel segments are accessible. Bilateral testing is considered an integral part of a complete examination.  Photoelectric Plethysmograph (PPG) waveforms and toe systolic pressure readings are included as required and additional duplex testing as needed. Limited examinations for reoccurring indications may be performed as noted.  ABI Findings: +---------+------------------+-----+--------+---------------------------------+  Right     Rt Pressure (mmHg) Index Waveform Comment                            +---------+------------------+-----+--------+---------------------------------+  Brachial                                    restricted, had elbow surgery                                                   today                              +---------+------------------+-----+--------+---------------------------------+  PTA       131                1.12  biphasic                                    +---------+------------------+-----+--------+---------------------------------+  DP        128                1.09  biphasic                                    +---------+------------------+-----+--------+---------------------------------+  Great Toe 93                 0.79                                              +---------+------------------+-----+--------+---------------------------------+ +---------+------------------+-----+---------+----------+  Left      Lt Pressure (mmHg) Index Waveform  Comment     +---------+------------------+-----+---------+----------+  Brachial  117                      triphasic             +---------+------------------+-----+---------+----------+  PTA       127                1.09  biphasic              +---------+------------------+-----+---------+----------+  DP        143                1.22  biphasic              +---------+------------------+-----+---------+----------+  Great Toe                                    ulceration  +---------+------------------+-----+---------+----------+ +-------+-----------+-----------+------------+------------+  ABI/TBI Today's ABI Today's TBI Previous ABI Previous  TBI  +-------+-----------+-----------+------------+------------+  Right   1.1         0.79        1.1                        +-------+-----------+-----------+------------+------------+  Left    1.2                     1.1                        +-------+-----------+-----------+------------+------------+ Right ABIs appear essentially unchanged. Left ABIs appear essentially unchanged.  Summary: Right: Resting right ankle-brachial index is within normal range. No evidence of significant right lower extremity arterial disease. The right toe-brachial index is normal. Left: Resting left ankle-brachial index is within normal range. No evidence of significant left lower extremity arterial disease.  *See table(s) above for measurements and observations.  Electronically signed by Servando Snare MD on 04/17/2019 at 4:18:08 PM.    Final    Scheduled Meds:  aspirin EC  81 mg Oral BID   atorvastatin  10 mg Oral q1800   calcium-vitamin D  1 tablet Oral Daily   docusate sodium  100 mg Oral BID   feeding supplement (PRO-STAT SUGAR FREE 64)  30 mL Oral BID   ferrous sulfate  325 mg Oral Daily   guaiFENesin  1,200 mg Oral BID   heparin  5,000 Units Subcutaneous Q8H   insulin aspart  0-15 Units Subcutaneous TID WC   insulin aspart  0-5 Units Subcutaneous QHS   insulin aspart  4 Units Subcutaneous TID WC   insulin glargine  20 Units Subcutaneous Daily   insulin glargine  40 Units Subcutaneous QHS   multivitamin with minerals  1 tablet Oral Daily   nystatin   Topical TID   Ensure Max Protein  11 oz Oral BID   Continuous Infusions:  sodium chloride 75 mL/hr at 04/19/19 0251   vancomycin 1,000 mg (04/19/19 1252)    LOS: 4 days   Kerney Elbe, DO [facility] PAGER is on [place]  If 7PM-7AM, please contact night-coverage [email or web address] Password [email or web address] 04/19/2019, 1:54 PM

## 2019-04-19 NOTE — Progress Notes (Signed)
Pharmacy Antibiotic Note  [name] [name] is a 63 y.o. female admitted on 04/15/2019 with fall and arm pain.  Pharmacy has been consulted for levofloxacin dosing for UTI and vancomycin for cellulitis   Pt presents with chief complaint of right forearm pain s/p fall complaints of pain/swelling of elbow. Pt also presents with urinary retention and complains of dysuria.  Pharmacy consulted to dose levofloxacin and vancomycin for UTI and cellulitis. Pt has severe PCN allergy.  ID saw pt on 6/18 and stop levofloxacin as pt w/o complaints of UTI.    ID:  Septic arthritis   Today, 04/19/19  WBC normalized  CKD - SCr continues improving.  SCr down to 1.48 today  Afeb  Culture of elbox - gram stain showing GPC  Plan:  Increase vancomycin to 1000 mg IV q36h for predicted AUC = 485.1 using SCr = 1.48  Goal AUC 400-550  Monitor renal function closely for any necessary dose adjustments. Daily SCr.  Check vancomycin levels once at steady state (or sooner) if indicated  Height: 5' 2"  (157.5 cm) Weight: 202 lb 6.1 oz (91.8 kg) IBW/kg (Calculated) : 50.1  Temp (24hrs), Avg:98.7 F (37.1 C), Min:98.3 F (36.8 C), Max:98.9 F (37.2 C)  Recent Labs  Lab 04/15/19 2152 04/16/19 0825 04/17/19 0158 04/18/19 0508 04/19/19 0535  WBC 11.0* 10.0 7.8 8.1 8.7  CREATININE 3.06* 2.64* 2.27* 1.92* 1.48*    Estimated Creatinine Clearance: 41 mL/min (A) (by C-G formula based on SCr of 1.48 mg/dL (H)).    Allergies  Allergen Reactions  . Bee Pollen Anaphylaxis  . Fish-Derived Products Hives, Shortness Of Breath, Swelling and Rash    Hives get in throat causing trouble breathing  . Mushroom Extract Complex Anaphylaxis  . Penicillins Anaphylaxis    Did it involve swelling of the face/tongue/throat, SOB, or low BP? Yes Did it involve sudden or severe rash/hives, skin peeling, or any reaction on the inside of your mouth or nose? No Did you need to seek medical attention at a hospital or  doctor's office? Yes When did it last happen?A few months ago If all above answers are "NO", may proceed with cephalosporin use.  [name] Kitchen Rosemary Oil Anaphylaxis  . Shellfish Allergy Hives, Shortness Of Breath, Swelling and Rash  . Tomato Hives and Shortness Of Breath    Hives in throat causes her trouble breathing  . Acetaminophen Other (See Comments)    GI upset  . Acyclovir And Related Other (See Comments)    Unknown rxn  . Aloe Vera Hives  . Naproxen Other (See Comments)    Unknown rxn    Antimicrobials this admission: 6/16 Levaquin>> 6/18 6/16 vanc>>  Dose adjustments this admission:  Microbiology results: 6/16 SARS 2 negative 6/17 synovial elbow: MSSA (cont Vanc per Dr Megan Salon due to rxn to cefazolin in past) 6/17 MRSA PCR: negative  No urine culture obtained on admission.  Thank you for allowing pharmacy to be a part of this patient's care.  Leone Haven, PharmD 04/19/2019 11:50 AM

## 2019-04-19 NOTE — Progress Notes (Signed)
IR consulted by Dr. Alfredia Ferguson for possible image-guided tunneled CVC placement.  Plan for procedure Monday 6/22 in IR. Will consent patient prior to procedure. Dr. Alfredia Ferguson aware and agrees with plan.  Please call IR with questions/concerns.   Bea Graff Rossy Virag, PA-C 04/19/2019, 1:30 PM

## 2019-04-20 ENCOUNTER — Inpatient Hospital Stay (HOSPITAL_COMMUNITY): Payer: PRIVATE HEALTH INSURANCE

## 2019-04-20 LAB — COMPREHENSIVE METABOLIC PANEL
ALT: 31 U/L (ref 0–44)
AST: 46 U/L — ABNORMAL HIGH (ref 15–41)
Albumin: 2.3 g/dL — ABNORMAL LOW (ref 3.5–5.0)
Alkaline Phosphatase: 98 U/L (ref 38–126)
Anion gap: 8 (ref 5–15)
BUN: 36 mg/dL — ABNORMAL HIGH (ref 8–23)
CO2: 23 mmol/L (ref 22–32)
Calcium: 8.5 mg/dL — ABNORMAL LOW (ref 8.9–10.3)
Chloride: 109 mmol/L (ref 98–111)
Creatinine, Ser: 1.21 mg/dL — ABNORMAL HIGH (ref 0.44–1.00)
GFR calc Af Amer: 55 mL/min — ABNORMAL LOW (ref >60)
GFR calc non Af Amer: 48 mL/min — ABNORMAL LOW (ref >60)
Glucose, Bld: 95 mg/dL (ref 70–99)
Potassium: 3.8 mmol/L (ref 3.5–5.1)
Sodium: 140 mmol/L (ref 135–145)
Total Bilirubin: 1 mg/dL (ref 0.3–1.2)
Total Protein: 6.4 g/dL — ABNORMAL LOW (ref 6.5–8.1)

## 2019-04-20 LAB — CBC WITH DIFFERENTIAL/PLATELET
Abs Immature Granulocytes: 0.25 10*3/uL — ABNORMAL HIGH (ref 0.00–0.07)
Basophils Absolute: 0 10*3/uL (ref 0.0–0.1)
Basophils Relative: 1 %
Eosinophils Absolute: 0.1 10*3/uL (ref 0.0–0.5)
Eosinophils Relative: 1 %
HCT: 30.9 % — ABNORMAL LOW (ref 36.0–46.0)
Hemoglobin: 9.6 g/dL — ABNORMAL LOW (ref 12.0–15.0)
Immature Granulocytes: 3 %
Lymphocytes Relative: 15 %
Lymphs Abs: 1.2 10*3/uL (ref 0.7–4.0)
MCH: 26.7 pg (ref 26.0–34.0)
MCHC: 31.1 g/dL (ref 30.0–36.0)
MCV: 85.8 fL (ref 80.0–100.0)
Monocytes Absolute: 0.8 10*3/uL (ref 0.1–1.0)
Monocytes Relative: 10 %
Neutro Abs: 5.7 10*3/uL (ref 1.7–7.7)
Neutrophils Relative %: 70 %
Platelets: 270 10*3/uL (ref 150–400)
RBC: 3.6 MIL/uL — ABNORMAL LOW (ref 3.87–5.11)
RDW: 15.8 % — ABNORMAL HIGH (ref 11.5–15.5)
WBC: 8.1 10*3/uL (ref 4.0–10.5)
nRBC: 0 % (ref 0.0–0.2)

## 2019-04-20 LAB — MAGNESIUM: Magnesium: 1.7 mg/dL (ref 1.7–2.4)

## 2019-04-20 LAB — GLUCOSE, CAPILLARY
Glucose-Capillary: 83 mg/dL (ref 70–99)
Glucose-Capillary: 86 mg/dL (ref 70–99)
Glucose-Capillary: 125 mg/dL — ABNORMAL HIGH (ref 70–99)
Glucose-Capillary: 121 mg/dL — ABNORMAL HIGH (ref 70–99)

## 2019-04-20 LAB — PHOSPHORUS: Phosphorus: 3.7 mg/dL (ref 2.5–4.6)

## 2019-04-20 MED ORDER — VANCOMYCIN HCL IN DEXTROSE 750-5 MG/150ML-% IV SOLN
750.0000 mg | INTRAVENOUS | Status: DC
  Administered 2019-04-20 – 2019-04-21 (×2): 750 mg via INTRAVENOUS
  Filled 2019-04-20 (×2): qty 150

## 2019-04-20 NOTE — Progress Notes (Signed)
PROGRESS NOTE    [name] [name]  AJO:[id number] DOB: Apr 21, 1956 DOA: 04/15/2019 PCP: Ann Held, DO  Brief Narrative:  HPI per Dr. Gala Romney on 04/15/2019 [name] [name] is a 63 y.o. female with medical history significant of coronary artery disease, hypertension, chronic pain syndrome, anxiety disorder, asthma, multiple skin ulcers, left foot ongoing also, right elbow surgery, chronic kidney disease stage III with history of transplant previously according to patient, diabetes and hypertension who presented to [place] today complaining of fever chills as well as pain and swelling of the right elbow.  She reported falling and injuring the area about 3 days ago.  She was seen in the ER yesterday with unremarkable x-rays and patient was sent home.  Patient returned today with fever pain a 10 out of 10.  Denied any cough shortness of breath or dysuria.  Patient was noted to have significant swelling tenderness and redness of the right elbow.  She also had fever in the ER.  Additionally has acute on chronic kidney disease with possible UTI.  She has been admitted to the medical service for treatment.  She denied any other injury.  Denied any nausea vomiting or diarrhea..  ED Course: Temperature is 101.6 blood pressure 101/57, pulse 110 respiratory 24 oxygen sat 82% on room air currently at 100% on 2 L.  White count is 11,000 hemoglobin 9.2 and platelets of 113. Creatinine is 3.47.  Previous creatinine was 2.4.  Urinalysis showed poor collection but few bacteria.  RBC 21-50 squamous 11-20 but WBC also 21-50. X-ray of the right elbow yesterday showed possible effusion.  **Interim History IV fluids have been continued and creatinine is trending down and improving.  Orthopedic surgery has been consulted and they did bedside I and D and they were concerned for Septic Arthritis so took the patient for ID in the OR 04/17/2019 and Cx's Sent and showing Staph Aureus that is  Pan-Sensitive. ID was consulted for further evaluation and recommendations and recommending continuing IV Vancomycin due to Cefazolin allergy and likely needing a Central Line due to Chronic Renal Insuffiencey for long term Antibiotics.  Dr. Megan Salon recommending at least 2 weeks of antibiotic therapy and Orthopedics removed drain.  I have consulted IR for a tunneled CVC placement and this will be done 04/21/2019 per my discussion with the IR PA. Her confusion appears to be improving and she is now off O2  Assessment & Plan:   Principal Problem:   Septic arthritis of elbow, right (HCC) Active Problems:   Hyperlipidemia   THROMBOCYTOPENIA   Essential hypertension   Hepatic cirrhosis (HCC)   UTI   CAD (coronary artery disease)   Hyponatremia   Normocytic anemia   History of MI (myocardial infarction)   CKD (chronic kidney disease), stage III (HCC)   Neuropathy   Diabetic peripheral neuropathy (HCC)   Ulcer of left heel, limited to breakdown of skin ([place])   Type 2 diabetes mellitus with hyperglycemia, with long-term current use of insulin (HCC)   Sleep apnea   Acute on chronic renal insufficiency   Pressure injury of skin   Retinopathy   Renal transplant, status post  Acute on Chronic Kidney Injury Stage 3, improving Metabolic Acidosis -Patient has possible prerenal causes but likely in the setting of infection from elbow -Patient is DNR/creatinine went from 69/3.47 is now 36/1.21 -With her past history of chronic kidney disease as well as history of falls we will get renal ultrasound to rule out  obstructive causes.   -In the meantime hydrate the patient and monitor renal function. -Renal ultrasound showed no hydronephrosis but patient was having significant bladder retention -Continue bladder scans and in and out cath and if continues to drain will place a Foley catheter -BUN/creatinine is trending down with IV fluid hydration and rate was reduced to 50 mL/hr but will now stop  -Avoiding Nephrotoxic medications and holding Hom Naproxen, Pregabalin and HCTZ -Follow-up on urine culture -Strict I's and O's and daily weights; She is +393 mL and Weight is now 195 and down from 202 yesterday but ? Accuracy  -Continue to Monitor and Trend Renal Fxn and repeat CMP in AM   Acute Urinary Retention, improving -Continue with INO cath per hospital policy if necessary -If patient continues to not void will place Foley catheter -Will need to rebladder scan; IF retaining >250 will have Foley placed -Currently voiding independently but will check a PVR after she has voided and this was 0  Acute respiratory failure with Hypoxia, improved  -Unclear etiology at this point but this has now resolved  -Repeat CXR this AM showed "No active disease" -Add flutter valve, incentive spirometry, guaifenesin 1200 g p.o. twice daily -Add Duoneb scheduled -Patient continued to be on 2 L of supplemental oxygen via nasal cannula but this is now weaned and she is on Room Air saturating 94% -Will add Flutter Valve, Incentive Spirometry, and Guaifenesin  -Continue monitor and repeat CXR in AM  Right elbow swelling and tenderness with concern for Cellulitis and now Septic Arthritis  -Elbow is warm to touch swollen and tender.   -Appears to have some effusion in there.   -Suspect posttraumatic versus septic elbow.   -WBC trended down from 11,000 now 8,100 -Continue to treat with Vanco and Levaquin but IV Levaquin has now been discontinued by infectious diseases -Orthopedic consult placed and Dr. [name] evaluated and did a bedside I&D but then took the patient to the OR 04/17/2019 and felt the patient had septic arthritis; patient's elbow was debrided and infectious disease was consulted for further antibiotic management and a drain was placed in the patient's elbow and she will have surgery removal in 10 to 14 days -Initial synovial culture showed moderate gram-positive cocci but the cultures were  too young to read so we will follow-up on susceptibilities; Cx growing Staph Aureus that is Pan-Sensitive  -CRP is 32.3 and ESR was 126 -Orthopedics evaluated and removed drain  -Further management by ID and they are planning on long-term antibiotic treatment and will have CVC for Management and then Removal after Completion   Uncontrolled Diabetes Insulin Dependent  -Continue home regimen with Lantus with  Moderate Novolog sliding scale insulin. -Blood sugars are currently uncontrolled and will need further adjustments -Pulmonary was he was 12.2  -Continue with Lantus 20 units in a.m. and 40 units nightly along with meal coverage with 4 units of NovoLog 3 times daily -CBGs have been ranging from 83-139 -Consulting Diabetes Education Coordinator will follow up with patient and patient will need stricter diabetic control  Hypertension -Blood pressure is elevated.   -Home regimen with HCTZ currently being held and monitor closely and adjust as necessary. -Will add IV Hydralazine 5 mg q6hprn for SBP>170 or DBP>95 -BP is now 156/74  Coronary Artery Disease -Stable appears to be at baseline.  Suspected UTI -Sample appears contaminated.   -May recollect urine.  -As above.  WBC trended down from 11,000 and is now 8,700 -Will empirically be on treatment and  Gram Negative Coverage discontinued -Follow Culturesbut urine Cx never sent   Normocytic Anemia/Anemia of Chronic Kdney Disease -BUN/creatinine is currently stable at 9.6/30.9 -Anemia panel done and showed an iron level of 25, U IBC 142, TIBC 167, saturation ratios of 15%, ferritin level 415, folate level 12.9, and vitamin B12 level of 1380 -Continue monitor for signs and symptoms of bleeding; no overt bleeding noted currently -Repeat CBC in a.m.  Hyponatremia, improving -It appears to be chronic is now improving with IV fluid resuscitation -Improved with IV fluid resuscitation initially but slightly dropped but is now improved  to 140 -Continue to monitor and trend and follow sodium levels  Elevated Troponin -In setting of renal failure -Currently denies any chest pain -Continue monitor and trend and was trending downward and went from 0.10 -> 0.15 -> 0.09 -> 0.09 -> 0.07  Obesity -Estimated body mass index is 35.69 kg/m as calculated from the following:   Height as of this encounter: 5' 2" (1.575 m).   Weight as of this encounter: 88.5 kg. -Weight Loss and Dietary Counseling given   Hyperlipidemia -Continue Atorvastatin 10 mg p.o. nightly  Diabetic Foot Wound -She has a wound on the heel of her foot along with a wound on the big toe and fifth toe -Patient does have pulses to the left foot -We will check vascular ABI resting ankle-brachial index was within normal limits on the right and the left resting ankle-brachial index is within normal range with no evidence of significant lower extremity arterial disease noted. -We will consult wound nurse -X-ray showed "Plantar foot ulcer. Negative for osteomyelitis. Postop resection posterior calcaneus." -We will need stricter control of her blood sugars -We will refer to vascular in outpatient setting -Patient is currently on antibiotics with IV vancomycin will continue -She follows up at the wound clinic as an outpatient -Peripheral vascular navigator contacted and will follow up with the patient -Continue outpatient wound follow up   Hypophosphatemia -Patient's phosphorus level was 3.7 this morning -Continue to monitor and replete as necessary -Repeat CMP in a.m.  Abnormal AST -Patient's AST was trending upwards and peaked at 57 but repeat is now 66 Plan continue monitor and trend -If consistently elevated or worsening will check acute hepatitis panel and right upper quadrant ultrasound -Repeat CMP in a.m.  DVT prophylaxis: Heparin 5000 units subcu every 8 Code Status: FULL CODE Family Communication: No family present at bedside updated husband over  the telephone Disposition Plan: Pending further workup and treatment and will likely need a central line for long-term antibiotic management and likely will be done Monday.  Anticipating discharge after central line is placed; will need to wean oxygen as tolerated  Consultants:   Orthopedic Surgery   Infectious Diseases   Procedures: Renal U/S  Antimicrobials:  Anti-infectives (From admission, onward)   Start     Dose/Rate Route Frequency Ordered Stop   04/20/19 1300  vancomycin (VANCOCIN) IVPB 750 mg/150 ml premix     750 mg 150 mL/hr over 60 Minutes Intravenous Every 24 hours 04/20/19 0840     04/19/19 1800  vancomycin (VANCOCIN) IVPB 1000 mg/200 mL premix  Status:  Discontinued     1,000 mg 200 mL/hr over 60 Minutes Intravenous Every 48 hours 04/18/19 0840 04/19/19 1129   04/19/19 1200  vancomycin (VANCOCIN) IVPB 1000 mg/200 mL premix  Status:  Discontinued     1,000 mg 200 mL/hr over 60 Minutes Intravenous Every 36 hours 04/19/19 1158 04/20/19 0840   04/17/19 2330  vancomycin (  VANCOCIN) IVPB 750 mg/150 ml premix  Status:  Discontinued     750 mg 150 mL/hr over 60 Minutes Intravenous Every 48 hours 04/15/19 2329 04/18/19 0840   04/17/19 2200  levofloxacin (LEVAQUIN) tablet 500 mg  Status:  Discontinued     500 mg Oral Every 48 hours 04/15/19 2252 04/16/19 1020   04/17/19 1800  levofloxacin (LEVAQUIN) tablet 750 mg  Status:  Discontinued     750 mg Oral Every 48 hours 04/16/19 1020 04/17/19 1535   04/16/19 0000  vancomycin (VANCOCIN) 1,750 mg in sodium chloride 0.9 % 500 mL IVPB     1,750 mg 250 mL/hr over 120 Minutes Intravenous  Once 04/15/19 2319 04/16/19 0220   04/15/19 2200  levofloxacin (LEVAQUIN) IVPB 750 mg     750 mg 100 mL/hr over 90 Minutes Intravenous  Once 04/15/19 2142 04/15/19 2352     Subjective: Seen and examined at bedside and she was complaining of the pain was more alert.  No chest pain, lightheadedness or dizziness.  States that she slept okay.  Now off  of oxygen.  No other concerns or complaints at this time  Objective: Vitals:   04/19/19 1343 04/19/19 2100 04/20/19 0534 04/20/19 1444  BP: (!) 171/78 (!) 166/79 (!) 163/73 (!) 156/74  Pulse: 89 80 76 79  Resp: _0 Temp: (!) 97.5 F (36.4 C) 97.9 F (36.6 C) 98 F (36.7 C) 98.2 F (36.8 C)  TempSrc: Oral Oral Oral Oral  SpO2: 97% 96% 94% 94%  Weight:   88.5 kg   Height:        Intake/Output Summary (Last 24 hours) at 04/20/2019 1604 Last data filed at 04/20/2019 1033 Gross per 24 hour  Intake 2166.31 ml  Output 3290 ml  Net -1123.69 ml   Filed Weights   04/18/19 0500 04/19/19 0528 04/20/19 0534  Weight: 90.8 kg 91.8 kg 88.5 kg   Examination: Physical Exam:  Constitutional: Well-nourished, well-developed obese Caucasian female currently appears calm and resting and not as confused today. Eyes: Sclera anicteric.  Lids and conjunctive are normal ENMT: External ears and nose appear normal.  Grossly normal hearing Neck: Appears supple no JVD Respiratory: Mildly diminished auscultation with no appreciable wheezing, rales, rhonchi.  Patient is not tachypneic wheezing excess muscle breathe.  She is not wearing supplemental oxygen via nasal cannula now Cardiovascular: Regular rate and rhythm.  No appreciable murmurs, rubs, gallops Abdomen: Soft, non-trichomonacide second body habitus.  Bowel sounds present GU: Deferred Musculoskeletal: Has a partially amputated right foot.  Right elbow is dressed and has a dressing with drain present when I examined Skin: Skin is warm and dry with no appreciable rashes but does have a diabetic foot wound on the right heel along with some wounds on her big toe pinky on the left foot Neurologic: Cranial nerves II through XII grossly intact no appreciable focal deficits. Psychiatric: Not as confused today and more alert.  Slightly anxious  Data Reviewed: I have personally reviewed following labs and imaging studies  CBC: Recent Labs  Lab  04/16/19 0825 04/17/19 0158 04/18/19 0508 04/19/19 0535 04/20/19 0524  WBC 10.0 7.8 8.1 8.7 8.1  NEUTROABS 8.5* 6.6 6.3 6.5 5.7  HGB 9.3* 9.0* 8.7* 9.3* 9.6*  HCT 28.5* 28.8* 28.0* 29.8* 30.9*  MCV 85.6 86.5 85.9 86.9 85.8  PLT 120* 122* 159 230 768   Basic Metabolic Panel: Recent Labs  Lab 04/16/19 0825 04/17/19 0158 04/18/19 0508 04/19/19 0535 04/20/19 0524  NA 129*  128* 131* 137 140  K 4.5 4.3 3.7 3.8 3.8  CL 97* 98 103 108 109  CO2 19* 19* 20* 20* 23  GLUCOSE 297* 307* 172* 100* 95  BUN 64* 62* 58* 46* 36*  CREATININE 2.64* 2.27* 1.92* 1.48* 1.21*  CALCIUM 8.0* 7.9* 8.2* 8.2* 8.5*  MG 1.9 1.9 2.0 1.9 1.7  PHOS 3.2 2.5 1.9* 2.8 3.7   GFR: Estimated Creatinine Clearance: 49.2 mL/min (A) (by C-G formula based on SCr of 1.21 mg/dL (H)). Liver Function Tests: Recent Labs  Lab 04/16/19 0825 04/17/19 0158 04/18/19 0508 04/19/19 0535 04/20/19 0524  AST 29 34 52* 57* 46*  ALT 21 24 32 33 31  ALKPHOS 72 78 82 95 98  BILITOT 1.0 0.8 0.8 0.6 1.0  PROT 6.9 6.3* 6.1* 6.1* 6.4*  ALBUMIN 2.8* 2.4* 2.3* 2.2* 2.3*   No results for input(s): LIPASE, AMYLASE in the last 168 hours. No results for input(s): AMMONIA in the last 168 hours. Coagulation Profile: Recent Labs  Lab 04/19/19 0854  INR 1.1   Cardiac Enzymes: Recent Labs  Lab 04/15/19 1113 04/15/19 1603 04/16/19 1956 04/17/19 0158 04/17/19 0827  TROPONINI 0.10* 0.15* 0.09* 0.09* 0.07*   BNP (last 3 results) No results for input(s): PROBNP in the last 8760 hours. HbA1C: No results for input(s): HGBA1C in the last 72 hours. CBG: Recent Labs  Lab 04/19/19 1121 04/19/19 1722 04/19/19 2015 04/20/19 0749 04/20/19 1113  GLUCAP 96 139* 119* 83 86   Lipid Profile: No results for input(s): CHOL, HDL, LDLCALC, TRIG, CHOLHDL, LDLDIRECT in the last 72 hours. Thyroid Function Tests: No results for input(s): TSH, T4TOTAL, FREET4, T3FREE, THYROIDAB in the last 72 hours. Anemia Panel: Recent Labs    04/19/19  0535  VITAMINB12 1,308*  FOLATE 12.9  FERRITIN 415*  TIBC 167*  IRON 25*  RETICCTPCT 2.5   Sepsis Labs: No results for input(s): PROCALCITON, LATICACIDVEN in the last 168 hours.  Recent Results (from the past 240 hour(s))  SARS Coronavirus 2 (Hosp order,Performed in [facility] lab via [employer] [facility])     Status: None   Collection Time: 04/15/19 11:14 AM   Specimen: Dry Nasal Swab ([employer] [facility] Now)  Result Value Ref Range Status   SARS Coronavirus 2 ([employer] Now) NEGATIVE NEGATIVE Final    Comment: (NOTE) Interpretive Result Comment(s): COVID 19 Positive SARS CoV 2 target nucleic acids are DETECTED. The SARS CoV 2 RNA is generally detectable in upper and lower respiratory specimens during the acute phase of infection.  Positive results are indicative of active infection with SARS CoV 2.  Clinical correlation with patient history and other diagnostic information is necessary to determine patient infection status.  Positive results do not rule out bacterial infection or coinfection with other viruses. The expected result is Negative. COVID 19 Negative SARS CoV 2 target nucleic acids are NOT DETECTED. The SARS CoV 2 RNA is generally detectable in upper and lower respiratory specimens during the acute phase of infection.  Negative results do not preclude SARS CoV 2 infection, do not rule out coinfections with other pathogens, and should not be used as the sole basis for treatment or other patient management decisions.  Negative results must be combined with clinical  observations, patient history, and epidemiological information. The expected result is Negative. Invalid Presence or absence of SARS CoV 2 nucleic acids cannot be determined. Repeat testing was performed on the submitted specimen and repeated Invalid results were obtained.  If clinically indicated, additional testing on a  new specimen with an alternate test methodology [phone]) is advised.  The SARS CoV 2 RNA is  generally detectable in upper and lower respiratory specimens during the acute phase of infection. The expected result is Negative. Fact Sheet for Patients:  GolfingFamily.no Fact Sheet for Healthcare Providers: https://www.hernandez-brewer.com/ This test is not yet approved or cleared by the [place] FDA and has been authorized for detection and/or diagnosis of SARS CoV 2 by FDA under an Emergency Use Authorization (EUA).  This EUA will remain in effect (meaning this test can be used) for the duration of the COVID19 d eclaration under Section 564(b)(1) of the Act, 21 U.S.C. section 3515676021 3(b)(1), unless the authorization is terminated or revoked sooner. Performed at [facility], [address] [place], [place] [address]   Body fluid culture     Status: Abnormal   Collection Time: 04/16/19  9:00 PM   Specimen: Synovium; Body Fluid  Result Value Ref Range Status   Specimen Description   Final    SYNOVIAL RIGHT ELBOW Performed at [facility] [address] [place], [place] [address]    Special Requests   Final    Immunocompromised Performed at [facility], [place] [address] [place], [place] [address]    Gram Stain   Final    FEW WBC PRESENT, PREDOMINANTLY PMN MODERATE GRAM POSITIVE COCCI Performed at [facility], [place] [address], [place], [place] [address]    Culture STAPHYLOCOCCUS AUREUS (A)  Final   Report Status 04/19/2019 FINAL  Final   Organism ID, Bacteria STAPHYLOCOCCUS AUREUS  Final      Susceptibility   Staphylococcus aureus - MIC*    CIPROFLOXACIN <=0.5 SENSITIVE Sensitive     ERYTHROMYCIN 0.5 SENSITIVE Sensitive     GENTAMICIN <=0.5 SENSITIVE Sensitive     OXACILLIN 0.5 SENSITIVE Sensitive     TETRACYCLINE <=1 SENSITIVE Sensitive     VANCOMYCIN 1 SENSITIVE Sensitive     TRIMETH/SULFA <=10 SENSITIVE Sensitive     CLINDAMYCIN <=0.25 SENSITIVE Sensitive     RIFAMPIN  <=0.5 SENSITIVE Sensitive     Inducible Clindamycin NEGATIVE Sensitive     * STAPHYLOCOCCUS AUREUS  MRSA PCR Screening     Status: None   Collection Time: 04/16/19 10:01 PM   Specimen: Nasal Mucosa; Nasopharyngeal  Result Value Ref Range Status   MRSA by PCR NEGATIVE NEGATIVE Final    Comment:        The GeneXpert MRSA Assay (FDA approved for NASAL specimens only), is one component of a comprehensive MRSA colonization surveillance program. It is not intended to diagnose MRSA infection nor to guide or monitor treatment for MRSA infections. Performed at [facility], [place] [address], [place], [place] [address]     Radiology Studies: Dg Chest Port 1 View  Result Date: 04/20/2019 CLINICAL DATA:  Dyspnea EXAM: PORTABLE CHEST 1 VIEW COMPARISON:  Chest radiograph from one day prior. FINDINGS: Stable cardiomediastinal silhouette with top-normal heart size. No pneumothorax. No pleural effusion. Lungs appear clear, with no acute consolidative airspace disease and no pulmonary edema. IMPRESSION: No active disease. Electronically Signed   By: Ilona Sorrel M.D.   On: 04/20/2019 07:18   Dg Chest Port 1 View  Result Date: 04/19/2019 CLINICAL DATA:  Dyspnea EXAM: PORTABLE CHEST 1 VIEW COMPARISON:  04/17/2019 chest radiograph. FINDINGS: Stable cardiomediastinal silhouette with normal heart size. No pneumothorax. No pleural effusion. Lungs appear clear, with no acute consolidative airspace disease and no pulmonary  edema. IMPRESSION: No active disease. Electronically Signed   By: Ilona Sorrel M.D.   On: 04/19/2019 06:33   Scheduled Meds: . aspirin EC  81 mg Oral BID  . atorvastatin  10 mg Oral q1800  . calcium-vitamin D  1 tablet Oral Daily  . docusate sodium  100 mg Oral BID  . feeding supplement (PRO-STAT SUGAR FREE 64)  30 mL Oral BID  . ferrous sulfate  325 mg Oral Daily  . guaiFENesin  1,200 mg Oral BID  . heparin  5,000 Units Subcutaneous Q8H  . insulin aspart  0-15 Units  Subcutaneous TID WC  . insulin aspart  0-5 Units Subcutaneous QHS  . insulin aspart  4 Units Subcutaneous TID WC  . insulin glargine  20 Units Subcutaneous Daily  . insulin glargine  40 Units Subcutaneous QHS  . multivitamin with minerals  1 tablet Oral Daily  . nystatin   Topical TID  . Ensure Max Protein  11 oz Oral BID   Continuous Infusions: . sodium chloride 50 mL/hr at 04/20/19 0830  . vancomycin 750 mg (04/20/19 1315)    LOS: 5 days   Kerney Elbe, DO [facility] PAGER is on [place]  If 7PM-7AM, please contact night-coverage [email or web address] Password [facility] 04/20/2019, 4:04 PM

## 2019-04-20 NOTE — Progress Notes (Signed)
Pharmacy Antibiotic Note  [name] [name] is a 63 y.o. female admitted on 04/15/2019 with fall and arm pain.  Pharmacy has been consulted for vancomycin for MSSA R elbow synovitis. Pt has severe PCN allergy.  ID on board and recommends at least 2 weeks of IV abx.  Today, 04/20/19  WBC normalized  CKD - SCr continues improving - down to 1.21 today (suspect baseline ~1.6).  Afeb  Plan:  Change vancomycin to 750 mg IV q24h for predicted AUC = 485.1 using SCr = 1.21  Goal AUC 400-550  Monitor renal function closely for any necessary dose adjustments. Daily SCr.  Check vancomycin levels once at steady state (or sooner) if indicated  Height: 5' 2"  (157.5 cm) Weight: 195 lb 1.7 oz (88.5 kg) IBW/kg (Calculated) : 50.1  Temp (24hrs), Avg:97.8 F (36.6 C), Min:97.5 F (36.4 C), Max:98 F (36.7 C)  Recent Labs  Lab 04/16/19 0825 04/17/19 0158 04/18/19 0508 04/19/19 0535 04/20/19 0524  WBC 10.0 7.8 8.1 8.7 8.1  CREATININE 2.64* 2.27* 1.92* 1.48* 1.21*    Estimated Creatinine Clearance: 49.2 mL/min (A) (by C-G formula based on SCr of 1.21 mg/dL (H)).    Allergies  Allergen Reactions  . Bee Pollen Anaphylaxis  . Fish-Derived Products Hives, Shortness Of Breath, Swelling and Rash    Hives get in throat causing trouble breathing  . Mushroom Extract Complex Anaphylaxis  . Penicillins Anaphylaxis    Did it involve swelling of the face/tongue/throat, SOB, or low BP? Yes Did it involve sudden or severe rash/hives, skin peeling, or any reaction on the inside of your mouth or nose? No Did you need to seek medical attention at a hospital or doctor's office? Yes When did it last happen?A few months ago If all above answers are "NO", may proceed with cephalosporin use.  [name] Kitchen Rosemary Oil Anaphylaxis  . Shellfish Allergy Hives, Shortness Of Breath, Swelling and Rash  . Tomato Hives and Shortness Of Breath    Hives in throat causes her trouble breathing  . Acetaminophen Other (See  Comments)    GI upset  . Acyclovir And Related Other (See Comments)    Unknown rxn  . Aloe Vera Hives  . Naproxen Other (See Comments)    Unknown rxn    Antimicrobials this admission: 6/16 Levaquin>> 6/18 6/16 vanc>>  Dose adjustments this admission: 6/19 Changed to 1gm IV q36h due to improvement in renal function 6/20 Changed to 773m IV q24h due to improvement in renal function  Microbiology results: 6/16 SARS 2 negative 6/17 synovial elbow: MSSA (cont Vanc per Dr CMegan Salondue to rxn to cefazolin in past) 6/17 MRSA PCR: negative  No urine culture obtained on admission.  Thank you for allowing pharmacy to be a part of this patient's care.  CSherlon Handing PharmD, BCPS Clinical pharmacist 04/20/2019 8:33 AM

## 2019-04-20 NOTE — Progress Notes (Signed)
   PATIENT KG:YJEHUD Myna Hidalgo 3 Day Post-OpProcedure(s) (LRB): INCISION AND DRAINAGE Right arm (Right)  Subjective:The patient was a bit  confused overnight but improved with ativan and overall appears to be improving. She reports minimal pain at elbow but states it continues to feel "heavy." She desires to go home and it appears ID informed her potentially Monday after she receives a central line for IV ABX. IV Vanc rec for 2 weeks min as cx showed staph aureus. CVC placement planned for tomorrow.   Objective: BP (!) 163/73 (BP Location: Left Arm)   Pulse 76   Temp 98 F (36.7 C) (Oral)   Resp 16   Ht 5' 2"  (1.575 m)   Wt 88.5 kg   SpO2 94%   BMI 35.69 kg/m    CBC Latest Ref Rng & Units 04/20/2019 04/19/2019 04/18/2019  WBC 4.0 - 10.5 K/uL 8.1 8.7 8.1  Hemoglobin 12.0 - 15.0 g/dL 9.6(L) 9.3(L) 8.7(L)  Hematocrit 36.0 - 46.0 % 30.9(L) 29.8(L) 28.0(L)  Platelets 150 - 400 K/uL 270 230 159   CMP Latest Ref Rng & Units 04/20/2019 04/19/2019 04/18/2019  Glucose 70 - 99 mg/dL 95 100(H) 172(H)  BUN 8 - 23 mg/dL 36(H) 46(H) 58(H)  Creatinine 0.44 - 1.00 mg/dL 1.21(H) 1.48(H) 1.92(H)  Sodium 135 - 145 mmol/L 140 137 131(L)  Potassium 3.5 - 5.1 mmol/L 3.8 3.8 3.7  Chloride 98 - 111 mmol/L 109 108 103  CO2 22 - 32 mmol/L 23 20(L) 20(L)  Calcium 8.9 - 10.3 mg/dL 8.5(L) 8.2(L) 8.2(L)  Total Protein 6.5 - 8.1 g/dL 6.4(L) 6.1(L) 6.1(L)  Total Bilirubin 0.3 - 1.2 mg/dL 1.0 0.6 0.8  Alkaline Phos 38 - 126 U/L 98 95 82  AST 15 - 41 U/L 46(H) 57(H) 52(H)  ALT 0 - 44 U/L 31 33 32    She is eating, appears comfortable sitting up in chair. Ace wrap and drain in place. The drain had scant output since yesterday and was removed without difficulty. Dressing changed and ace wrap reapplied.   Assessment and Plan:Postop day 3 status post I&D right septic elbow  Drain removed as scant output since yesterday, dressing changed  Antibiotics per ID  IV Vanc rec for 2 wks min cx showed staph  aureus.   CVC placement planned for tomorrow Cleared from ortho standpoint  -Rec 1 final dressing change just prior to D/C Once she is discharged she can follow up with Dr [name] in about 2 weeks for suture removal

## 2019-04-21 ENCOUNTER — Inpatient Hospital Stay (HOSPITAL_COMMUNITY): Payer: PRIVATE HEALTH INSURANCE

## 2019-04-21 ENCOUNTER — Encounter (HOSPITAL_COMMUNITY): Payer: Self-pay | Admitting: Interventional Radiology

## 2019-04-21 DIAGNOSIS — K7469 Other cirrhosis of liver: Secondary | ICD-10-CM

## 2019-04-21 HISTORY — PX: IR US GUIDE VASC ACCESS RIGHT: IMG2390

## 2019-04-21 HISTORY — PX: IR FLUORO GUIDE CV LINE RIGHT: IMG2283

## 2019-04-21 LAB — CBC WITH DIFFERENTIAL/PLATELET
Abs Immature Granulocytes: 0.3 10*3/uL — ABNORMAL HIGH (ref 0.00–0.07)
Basophils Absolute: 0 10*3/uL (ref 0.0–0.1)
Basophils Relative: 1 %
Eosinophils Absolute: 0.1 10*3/uL (ref 0.0–0.5)
Eosinophils Relative: 2 %
HCT: 29.8 % — ABNORMAL LOW (ref 36.0–46.0)
Hemoglobin: 9.9 g/dL — ABNORMAL LOW (ref 12.0–15.0)
Immature Granulocytes: 4 %
Lymphocytes Relative: 17 %
Lymphs Abs: 1.4 10*3/uL (ref 0.7–4.0)
MCH: 27.9 pg (ref 26.0–34.0)
MCHC: 33.2 g/dL (ref 30.0–36.0)
MCV: 83.9 fL (ref 80.0–100.0)
Monocytes Absolute: 0.8 10*3/uL (ref 0.1–1.0)
Monocytes Relative: 10 %
Neutro Abs: 5.5 10*3/uL (ref 1.7–7.7)
Neutrophils Relative %: 66 %
Platelets: 277 10*3/uL (ref 150–400)
RBC: 3.55 MIL/uL — ABNORMAL LOW (ref 3.87–5.11)
RDW: 15.9 % — ABNORMAL HIGH (ref 11.5–15.5)
WBC: 8.2 10*3/uL (ref 4.0–10.5)
nRBC: 0 % (ref 0.0–0.2)

## 2019-04-21 LAB — COMPREHENSIVE METABOLIC PANEL
ALT: 31 U/L (ref 0–44)
AST: 44 U/L — ABNORMAL HIGH (ref 15–41)
Albumin: 2.1 g/dL — ABNORMAL LOW (ref 3.5–5.0)
Alkaline Phosphatase: 92 U/L (ref 38–126)
Anion gap: 10 (ref 5–15)
BUN: 28 mg/dL — ABNORMAL HIGH (ref 8–23)
CO2: 23 mmol/L (ref 22–32)
Calcium: 8.5 mg/dL — ABNORMAL LOW (ref 8.9–10.3)
Chloride: 107 mmol/L (ref 98–111)
Creatinine, Ser: 1.06 mg/dL — ABNORMAL HIGH (ref 0.44–1.00)
GFR calc Af Amer: >60 mL/min (ref >60)
GFR calc non Af Amer: 56 mL/min — ABNORMAL LOW (ref >60)
Glucose, Bld: 89 mg/dL (ref 70–99)
Potassium: 4 mmol/L (ref 3.5–5.1)
Sodium: 140 mmol/L (ref 135–145)
Total Bilirubin: 0.9 mg/dL (ref 0.3–1.2)
Total Protein: 6.2 g/dL — ABNORMAL LOW (ref 6.5–8.1)

## 2019-04-21 LAB — PHOSPHORUS: Phosphorus: 3.8 mg/dL (ref 2.5–4.6)

## 2019-04-21 LAB — MAGNESIUM: Magnesium: 1.6 mg/dL — ABNORMAL LOW (ref 1.7–2.4)

## 2019-04-21 LAB — GLUCOSE, CAPILLARY
Glucose-Capillary: 88 mg/dL (ref 70–99)
Glucose-Capillary: 107 mg/dL — ABNORMAL HIGH (ref 70–99)
Glucose-Capillary: 212 mg/dL — ABNORMAL HIGH (ref 70–99)

## 2019-04-21 MED ORDER — SODIUM CHLORIDE 0.9 % IV SOLN
INTRAVENOUS | Status: DC | PRN
  Administered 2019-04-21: 250 mL via INTRAVENOUS

## 2019-04-21 MED ORDER — GUAIFENESIN ER 600 MG PO TB12: 600.0000 mg | ORAL_TABLET | Freq: Two times a day (BID) | ORAL | 0 refills | Status: DC

## 2019-04-21 MED ORDER — HYDROCHLOROTHIAZIDE 25 MG PO TABS: 25.0000 mg | ORAL_TABLET | Freq: Every day | ORAL | 1 refills | Status: DC

## 2019-04-21 MED ORDER — HEPARIN SOD (PORK) LOCK FLUSH 100 UNIT/ML IV SOLN
INTRAVENOUS | Status: AC | PRN
  Administered 2019-04-21: 500 [IU]

## 2019-04-21 MED ORDER — ASPIRIN 81 MG PO TBEC: 81.0000 mg | DELAYED_RELEASE_TABLET | Freq: Two times a day (BID) | ORAL | 0 refills | Status: DC

## 2019-04-21 MED ORDER — LIDOCAINE HCL 1 % IJ SOLN
INTRAMUSCULAR | Status: AC
  Filled 2019-04-21: qty 20

## 2019-04-21 MED ORDER — ENSURE MAX PROTEIN PO LIQD: 11.0000 [oz_av] | Freq: Two times a day (BID) | ORAL | 0 refills | Status: DC

## 2019-04-21 MED ORDER — NEPRO/CARBSTEADY PO LIQD: 237.0000 mL | Freq: Three times a day (TID) | ORAL | 0 refills | Status: DC | PRN

## 2019-04-21 MED ORDER — HEPARIN SOD (PORK) LOCK FLUSH 100 UNIT/ML IV SOLN
INTRAVENOUS | Status: AC
  Filled 2019-04-21: qty 5

## 2019-04-21 MED ORDER — PRO-STAT SUGAR FREE PO LIQD: 30.0000 mL | Freq: Two times a day (BID) | ORAL | 0 refills | Status: DC

## 2019-04-21 MED ORDER — VANCOMYCIN IV (FOR PTA / DISCHARGE USE ONLY): 750.0000 mg | INTRAVENOUS | 0 refills | Status: DC

## 2019-04-21 MED ORDER — NYSTATIN 100000 UNIT/GM EX POWD: Freq: Three times a day (TID) | CUTANEOUS | 0 refills | Status: DC

## 2019-04-21 MED ORDER — MAGNESIUM SULFATE 2 GM/50ML IV SOLN
2.0000 g | Freq: Once | INTRAVENOUS | Status: AC
  Administered 2019-04-21: 2 g via INTRAVENOUS
  Filled 2019-04-21: qty 50

## 2019-04-21 NOTE — TOC Initial Note (Signed)
Transition of Care [facility]) - Initial/Assessment Note    Patient Details  Name: [name] [name] MRN: [id number] Date of Birth: Feb 13, 1956  Transition of [facility]) CM/SW Contact:    Dessa Phi, RN Phone Number: 04/21/2019, 11:07 AM  Clinical Narrative: Patient for d/c today. Awaiting TIJ placement for iv abx long term. [employer] rep Pam following for iv abx @ home to teach spouse. Pam will also make arrangements w/[place] if needed for [facility] since no [place] agency will be able to provide [place] due to health insurance-not accepting.Patient's spouse has done the home iv abx in the past & willing to do again. Patient has impaired vision bilaterally, she has a dog(HHC must be aware) Spouse has already been involved with L leg wound care. Patient anxious to go home. Nsg to await [employer] Pam to confirm if teaching has been done with spouse prior d/c.No further CM needs.                 Expected Discharge Plan: [place] Barriers to Discharge: No Barriers Identified   Patient Goals and CMS Choice Patient states their goals for this hospitalization and ongoing recovery are:: go home CMS Medicare.gov Compare Post Acute Care list provided to:: Patient Choice offered to / list presented to : Patient  Expected Discharge Plan and Services Expected Discharge Plan: [place]   Discharge Planning Services: CM Consult   Living arrangements for the past 2 months: Single Family Home Expected Discharge Date: 04/17/19                         HH Arranged: RN, IV Antibiotics HH Agency: [employer] Date HH Agency Contacted: 04/21/19 Time HH Agency Contacted: 28 Representative spoke with at [place]: [place] Arrangements/Services Living arrangements for the past 2 months: [place] with:: Spouse Patient language and need for interpreter reviewed:: Yes Do you feel safe going back to the place where you live?: Yes      Need for Family  Participation in Patient Care: No (Comment) Care giver support system in place?: Yes (comment) Current home services: DME(rw) Criminal Activity/Legal Involvement Pertinent to Current Situation/Hospitalization: No - Comment as needed  Activities of Daily Living Home Assistive Devices/Equipment: Dentures (specify type), Eyeglasses, Walker (specify type)(knee walker) ADL Screening (condition at time of admission) Patient's cognitive ability adequate to safely complete daily activities?: Yes Is the patient deaf or have difficulty hearing?: No Does the patient have difficulty seeing, even when wearing glasses/contacts?: No Does the patient have difficulty concentrating, remembering, or making decisions?: No Patient able to express need for assistance with ADLs?: Yes Does the patient have difficulty dressing or bathing?: No Independently performs ADLs?: Yes (appropriate for developmental age) Does the patient have difficulty walking or climbing stairs?: No Weakness of Legs: Both Weakness of Arms/Hands: Both  Permission Sought/Granted Permission sought to share information with : Case Manager Permission granted to share information with : Yes, Verbal Permission Granted  Share Information with NAME: Emalee Knies  Permission granted to share info w AGENCY: [employer]  Permission granted to share info w Relationship: spouse  Permission granted to share info w Contact Information: [phone]  Emotional Assessment Appearance:: Appears stated age Attitude/Demeanor/Rapport: Gracious Affect (typically observed): Accepting Orientation: : Oriented to Self, Oriented to Place, Oriented to  Time, Oriented to Situation Alcohol / Substance Use: Never Used Psych Involvement: No (comment)  Admission diagnosis:  Hypoxia [R09.02] Elevated  troponin [R79.89] AKI (acute kidney injury) ([place]) [N17.9] Pain of right upper extremity [M79.601] Patient Active Problem List   Diagnosis Date Noted  . Pressure  injury of skin 04/17/2019  . Septic arthritis of elbow, right ([place]) 04/17/2019  . 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 Acute on chronic renal insufficiency 04/15/2019  . Sleep apnea 11/16/2017  . Diabetic foot infection ([place]) 04/06/2017  . Type 2 diabetes mellitus with hyperglycemia, with long-term current use of insulin ([place]) 04/06/2017  . Ulcer of left heel, limited to breakdown of skin ([place]) 02/01/2017  . 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 Neuropathy 05/05/2015  . Diabetic peripheral neuropathy ([place]) 01/12/2015  . Infection associated with orthopedic device ([place]) 12/11/2014  . CKD (chronic kidney disease), stage III ([place]) 05/27/2014  . History of MI (myocardial infarction) 10/06/2013  . Obesity (BMI 30-39.9) 09/20/2013  . 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 Hyponatremia 02/03/2013  . Normocytic anemia 02/03/2013  . CAD (coronary artery disease) 01/11/2012  . UTI 08/05/2010  . Hepatic cirrhosis ([place]) 07/06/2010  . 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  . Hyperlipidemia 06/03/2008  . Essential hypertension 12/23/2006   PCP:  Ann Held, DO Pharmacy:   [facility] [address] [place], [place] [address] Phone: [phone] Fax: [phone]     Social Determinants of Health (SDOH) Interventions    Readmission Risk Interventions No flowsheet data found.

## 2019-04-21 NOTE — TOC Progression Note (Signed)
Transition of Care [facility]) - Progression Note    Patient Details  Name: [name] [name] MRN: [id number] Date of Birth: 1956-02-20  Transition of [facility]) CM/SW Contact  Demitrious Mccannon, Juliann Pulse, RN Phone Number: 04/21/2019, 10:02 AM  Clinical Narrative:   [place] [phone]  [place] my Favorites Quality of Patient Care Rating 3 out of 5 stars Patient Survey Summary Rating 4 out of [place] [phone]  [place] my Favorites Quality of Patient Care Rating 4 out of 5 stars Patient Survey Summary Rating 4 out of 5 stars [place] [phone]  [place] my Favorites Quality of Patient Care Rating 4  out of 5 stars Patient Survey Summary Rating 4 out of 5 stars [place] [phone]  [place] my Favorites Quality of Patient Care Rating 4 out of 5 stars Patient Survey Summary Rating 4 out of 5 stars [place] [phone]  [place] my Favorites Quality of Patient Care Rating 4 out of 5 stars Patient Survey Summary Rating 4 out of 5 stars [facility] [place] [phone]  Add [facility] [place] my Favorites Quality of Patient Care Rating 3  out of 5 stars Patient Survey Summary Rating 4 out of 5 stars [place] [phone]  [place] my Favorites Quality of Patient Care Rating 3 out of 5 stars Patient Survey Summary Rating 4 out of 5 stars [facility] [phone]  Add [facility] my Favorites Quality of Patient Care Rating 4 out of 5 stars Not Available12 [facility] [phone]  Add [facility] my Favorites Quality of Patient Care Rating 3  out of 5 stars Patient Survey Summary Rating 3 out of  5 stars [facility] (336) [phone]  [place] my Favorites Quality of Patient Care Rating 3  out of 5 stars Patient Survey Summary Rating 4 out of 5 stars [place] [phone]  [place] my Favorites Quality of Patient Care Rating 3  out of 5 stars Patient Survey Summary Rating 4 out of 5 stars [place] [phone]  Add [facility] Quality of Patient Care Rating 3  out of 5 stars Patient Survey Summary Rating 3 out of [place] [phone]           Expected Discharge Plan and Services           Expected Discharge Date: 04/17/19                                     Social Determinants of Health (SDOH) Interventions    Readmission Risk Interventions No flowsheet data found.

## 2019-04-21 NOTE — Procedures (Signed)
Interventional Radiology Procedure Note  Procedure: Right IJ tunneled central venous Power catheter.  Tip at cavoatrial junction.   Complications: None  Estimated Blood Loss: None  Recommendations: - Routine line care   Signed,  Criselda Peaches, MD

## 2019-04-21 NOTE — Discharge Summary (Signed)
Physician Discharge Summary  [name] [name] CWC:[id number] DOB: December 06, 1955 DOA: 04/15/2019  PCP: Ann Held, DO  Admit date: 04/15/2019 Discharge date: 04/22/2019  Admitted From: Home Disposition: Home with [facility]  Recommendations for Outpatient Follow-up:  1. Follow up with PCP in 1-2 weeks 2. Follow up with Orthopedic Surgery within 2 weeks 3. Follow up with Infectious Diseases in 2 weeks 4. Please obtain CMP/CBC, Mag, Phos in one week 5. Please follow up on the following pending results:  Home Health: Yes  Equipment/Devices: Infusion Line Pump  Discharge Condition: Stable CODE STATUS: FULL CODE  Diet recommendation: Heart Healthy Carb Modified   Brief/Interim Summary: HPI per Dr. Gala Romney on 04/15/2019 [name] Reed Mooreis a 63 y.o.femalewith medical history significant ofcoronary artery disease, hypertension, chronic pain syndrome, anxiety disorder, asthma, multiple skin ulcers, left foot ongoing also, right elbow surgery, chronic kidney disease stage III with history of transplant previously according to patient, diabetes and hypertension who presented to [facility] today complaining of fever chills as well as pain and swelling of the right elbow. She reported falling and injuring the area about 3 days ago. She was seen in the ER yesterday with unremarkable x-rays and patient was sent home. Patient returned today with fever pain a 10 out of 10. Denied any cough shortness of breath or dysuria. Patient was noted to have significant swelling tenderness and redness of the right elbow. She also had fever in the ER. Additionally has acute on chronic kidney disease with possible UTI. She has been admitted to the medical service for treatment. She denied any other injury. Denied any nausea vomiting or diarrhea..  ED Course:Temperature is 101.6 blood pressure 101/57, pulse 110 respiratory 24 oxygen sat 82% on room air currently at100% on  2 L. White count is 11,000 hemoglobin 9.2 and platelets of 113. Creatinine is 3.47. Previous creatinine was 2.4. Urinalysis showed poor collection but few bacteria. RBC 21-50 squamous 11-20 but WBC also 21-50. X-ray of the right elbow yesterday showed possible effusion.  **Interim History IV fluids have been continued and creatinine is trending down and improving.  Orthopedic surgery has been consulted and they did bedside I and D and they were concerned for Septic Arthritis so took the patient for ID in the OR 04/17/2019 and Cx's Sent and showing Staph Aureus that is Pan-Sensitive. ID was consulted for further evaluation and recommendations and recommending continuing IV Vancomycin due to Cefazolin allergy and likely needing a Central Line due to Chronic Renal Insuffiencey for long term Antibiotics.  Dr. Megan Salon recommending at least 2 weeks of antibiotic therapy and Orthopedics removed drain.  I have consulted IR for a tunneled CVC placement and this will be done 04/21/2019 per my discussion with the IR PA. Her confusion appears to be improved and she is now off O2. She is back to baseline and was deemed stable to D/C after R IJ Tunneled Central Line was placed.  She will need at least 14 days of IV vancomycin per infectious diseases and will follow up with them within 1 week.  She also need to follow-up with orthopedic surgery within 1 week.  She was deemed stable for discharge at this time.  Discharge Diagnoses:  Principal Problem:   Septic arthritis of elbow, right (HCC) Active Problems:   Hyperlipidemia   THROMBOCYTOPENIA   Essential hypertension   Hepatic cirrhosis (HCC)   UTI   CAD (coronary artery disease)   Hyponatremia   Normocytic anemia  History of MI (myocardial infarction)   CKD (chronic kidney disease), stage III (HCC)   Neuropathy   Diabetic peripheral neuropathy (HCC)   Ulcer of left heel, limited to breakdown of skin ([place])   Type 2 diabetes mellitus with hyperglycemia,  with long-term current use of insulin (HCC)   Sleep apnea   Acute on chronic renal insufficiency   Pressure injury of skin   Retinopathy   Renal transplant, status post  Acute on Chronic Kidney Injury Stage 3, improving Metabolic Acidosis, improved -Patient has possible prerenal causes but likely in the setting of infection from elbow -Patient is DNR/creatinine went from 69/3.47 is now 28/1.06 -With her past history of chronic kidney disease as well as history of falls we will get renal ultrasound to rule out obstructive causes.  -In the meantime hydrate the patient and monitor renal function. -Renal ultrasound showed no hydronephrosis but patient was having significant bladder retention -Continue bladder scans and in and out cath and if continues to drain will place a Foley catheter -BUN/creatinine is trending down with IV fluid hydration and rate was reduced to 50 mL/hr but will now stop -Avoiding Nephrotoxic medications and holding Hom Naproxen, Pregabalin and HCTZ -Follow-up on urine culture -Strict I's and O's and daily weights; She is -646 mL and Weight is now 193 and down from 202  but ? Accuracy  -Continue to Monitor and Trend Renal Fxn and repeat CMP as an outpatient   Acute Urinary Retention, improving -Continue with INO cath per hospital policy if necessary -If patient continues to not void will place Foley catheter -Will need to rebladder scan; IF retaining >250 will have Foley placed -Currently voiding independently but will check a PVR after she has voided and this was 0  Acute respiratory failure with Hypoxia, improved  -Unclear etiology at this point but this has now resolved  -Repeat CXR this AM showed "No active disease" -Add flutter valve, incentive spirometry, guaifenesin 1200 g p.o. twice daily -Add Duoneb scheduled -Patient continued to be on 2 L of supplemental oxygen via nasal cannula but this is now weaned and she is on Room Air saturating 94% -Will add  Flutter Valve, Incentive Spirometry, and Guaifenesin  -Continue monitor and repeat CXR as an outpatient   Right elbow swelling and tenderness with concern for Cellulitis and now Septic Arthritis  -Elbow is warm to touch swollen and tender.  -Appears to have some effusion in there.  -Suspect posttraumatic versus septic elbow.  -WBC trended down from 11,000 now 8,100 -Continue to treat with Vanco and Levaquin but IV Levaquin has now been discontinued by infectious diseases -Orthopedic consult placed and Dr. [name] evaluated and did a bedside I&D but then took the patient to the OR 04/17/2019 and felt the patient had septic arthritis; patient's elbow was debrided and infectious disease was consulted for further antibiotic management and a drain was placed in the patient's elbow and she will have surgery removal in 10 to 14 days -Initial synovial culture showed moderate gram-positive cocci but the cultures were too young to read so we will follow-up on susceptibilities; Cx growing Staph Aureus that is Pan-Sensitive  -CRP is 32.3 and ESR was 126 -Orthopedics evaluated and removed drain  -Further management by ID and they are planning on long-term antibiotic treatment and will have CVC for Management and then Removal after Completion; Minimum of 14 Days of IV Vancomycin   Uncontrolled Diabetes Insulin Dependent  -Continue home regimen with Lantus with  Moderate Novolog sliding scale  insulin. -Blood sugars are currently uncontrolled and will need further adjustments -Hemoglobin A1c was he was 12.2  -Continue with Lantus 20 units in a.m. and 40 units nightly along with meal coverage with 4 units of NovoLog 3 times daily; Resume Home Regimen  -CBGs have been ranging from 88-212 -Consulting Diabetes Education Coordinator will follow up with patient and patient will need stricter diabetic control  Hypertension -Blood pressure is elevated.  -Home regimen with HCTZ currently being held and  monitor closely and adjust as necessary; Defer to PCP to resume HCTZ -Will add IV Hydralazine 5 mg q6hprn for SBP>170 or DBP>95 -BP is now 156/74  Coronary Artery Disease -Stable appears to be at baseline.  Suspected UTI -Sample appears contaminated.  -May recollect urine.  -As above.  WBC trended down from 11,000 and is now 8,200 -Will empirically be on treatment and Gram Negative Coverage discontinued -Follow Culturesbut urine Cx never sent   Normocytic Anemia/Anemia of Chronic Kdney Disease -BUN/creatinine is currently stable at 9.9/29.8 -Anemia panel done and showed an iron level of 25, U IBC 142, TIBC 167, saturation ratios of 15%, ferritin level 415, folate level 12.9, and vitamin B12 level of 1380 -Continue monitor for signs and symptoms of bleeding; no overt bleeding noted currently -Repeat CBC as an outpatient   Hyponatremia, improving -It appears to be chronic is now improving with IV fluid resuscitation -Improved with IV fluid resuscitation initially but slightly dropped but is now improved to 140 -Continue to monitor and trend and follow sodium levels as an outpatien   Elevated Troponin -In setting of renal failure -Currently denies any chest pain -Continue monitor and trend and was trending downward and went from 0.10 -> 0.15 -> 0.09 -> 0.09 -> 0.07  Obesity -Estimated body mass index is 35.36 kg/m as calculated from the following:   Height as of this encounter: 5' 2"  (1.575 m).   Weight as of this encounter: 87.7 kg. -Weight Loss and Dietary Counseling given   Hyperlipidemia -Continue Atorvastatin 10 mg p.o. nightly  Diabetic Foot Wound -She has a wound on the heel of her foot along with a wound on the big toe and fifth toe -Patient does have pulses to the left foot -We will check vascular ABI resting ankle-brachial index was within normal limits on the right and the left resting ankle-brachial index is within normal range with no evidence of  significant lower extremity arterial disease noted. -We will consult wound nurse -X-ray showed "Plantar foot ulcer. Negative for osteomyelitis. Postop resection posterior calcaneus." -We will need stricter control of her blood sugars -We will refer to vascular in outpatient setting -Patient is currently on antibiotics with IV vancomycin will continue -She follows up at the wound clinic as an outpatient -Peripheral vascular navigator contacted and will follow up with the patient -Continue outpatient wound follow up   Hypophosphatemia -Patient's phosphorus level was 3.8 this morning -Continue to monitor and replete as necessary -Repeat CMP in a.m.  Hypomagnesemia -Patient magnesium level was morning was 1.6 -Replete with IV mag sulfate 2 g -Continue to monitor and replete as necessary -Repeat magnesium level as an outpatient  Abnormal AST -Patient's AST was trending upwards and peaked at 57 but repeat is now 44 -Continue monitor and trend -If consistently elevated or worsening will check acute hepatitis panel and right upper quadrant ultrasound -Repeat CMP as an outpatient   Discharge Instructions  Discharge Instructions    Call MD for:  difficulty breathing, headache or visual disturbances  Complete by: As directed    Call MD for:  extreme fatigue   Complete by: As directed    Call MD for:  hives   Complete by: As directed    Call MD for:  persistant dizziness or light-headedness   Complete by: As directed    Call MD for:  persistant nausea and vomiting   Complete by: As directed    Call MD for:  redness, tenderness, or signs of infection (pain, swelling, redness, odor or green/yellow discharge around incision site)   Complete by: As directed    Call MD for:  severe uncontrolled pain   Complete by: As directed    Call MD for:  temperature >100.4   Complete by: As directed    Diet - low sodium heart healthy   Complete by: As directed    Diet Carb Modified   Complete  by: As directed    Discharge instructions   Complete by: As directed    You were cared for by a hospitalist during your hospital stay. If you have any questions about your discharge medications or the care you received while you were in the hospital after you are discharged, you can call the unit and ask to speak with the hospitalist on call if the hospitalist that took care of you is not available. Once you are discharged, your primary care physician will handle any further medical issues. Please note that NO REFILLS for any discharge medications will be authorized once you are discharged, as it is imperative that you return to your primary care physician (or establish a relationship with a primary care physician if you do not have one) for your aftercare needs so that they can reassess your need for medications and monitor your lab values.  Follow up with PCP, Orthopedics, and Infectious Diseases. Take all medications as prescribed. If symptoms change or worsen please return to the ED for evaluation   Home infusion instructions [facility] May follow [place] Dosing Protocol; May administer Cathflo as needed to maintain patency of vascular access device.; Flushing of vascular access device: per [facility] Protocol: 0.9% NaCl pre/post medica...   Complete by: As directed    Instructions: May follow [place] Dosing Protocol   Instructions: May administer Cathflo as needed to maintain patency of vascular access device.   Instructions: Flushing of vascular access device: per [facility] Protocol: 0.9% NaCl pre/post medication administration and prn patency; Heparin 100 u/ml, 35m for implanted ports and Heparin 10u/ml, 547mfor all other central venous catheters.   Instructions: May follow [facility] Anaphylaxis Protocol for First Dose Administration in the home: 0.9% NaCl at 25-50 ml/hr to maintain IV access for protocol meds. Epinephrine 0.3 ml IV/IM PRN and Benadryl 25-50 IV/IM PRN s/s of anaphylaxis.    Instructions: [place] [facility] Coordinator (RN) to assist per patient IV care needs in the home PRN.   Increase activity slowly   Complete by: As directed      Allergies as of 04/21/2019      Reactions   Bee Pollen Anaphylaxis   Fish-derived Products Hives, Shortness Of Breath, Swelling, Rash   Hives get in throat causing trouble breathing   Mushroom Extract Complex Anaphylaxis   Penicillins Anaphylaxis   Did it involve swelling of the face/tongue/throat, SOB, or low BP? Yes Did it involve sudden or severe rash/hives, skin peeling, or any reaction on the inside of your mouth or nose? No Did you need to seek medical attention at a  hospital or doctor's office? Yes When did it last happen?A few months ago If all above answers are "NO", may proceed with cephalosporin use.   Rosemary Oil Anaphylaxis   Shellfish Allergy Hives, Shortness Of Breath, Swelling, Rash   Tomato Hives, Shortness Of Breath   Hives in throat causes her trouble breathing   Acetaminophen Other (See Comments)   GI upset   Acyclovir And Related Other (See Comments)   Unknown rxn   Aloe Vera Hives   Broccoli [brassica Oleracea Italica] Hives   Naproxen Other (See Comments)   Unknown rxn      Medication List    STOP taking these medications   collagenase ointment Commonly known as: SANTYL   doxycycline 100 MG tablet Commonly known as: VIBRA-TABS   fenofibrate 160 MG tablet   fluticasone 50 MCG/ACT nasal spray Commonly known as: FLONASE   levocetirizine 5 MG tablet Commonly known as: Xyzal   levofloxacin 750 MG tablet Commonly known as: LEVAQUIN   montelukast 10 MG tablet Commonly known as: SINGULAIR   naproxen 500 MG tablet Commonly known as: NAPROSYN   silver sulfADIAZINE 1 % cream Commonly known as: SILVADENE     TAKE these medications   aspirin 81 MG EC tablet Take 1 tablet (81 mg total) by mouth 2 (two) times daily.   atorvastatin 10 MG tablet Commonly known as:  LIPITOR TAKE 1 TABLET BY MOUTH ONCE DAILY   CALCIUM ALGINATE EX Apply 1 patch topically every other day.   cyclobenzaprine 10 MG tablet Commonly known as: FLEXERIL Take 1 tablet (10 mg total) by mouth 3 (three) times daily as needed for up to 15 doses for muscle spasms.   docusate sodium 100 MG capsule Commonly known as: COLACE Take 100 mg by mouth daily.   DULoxetine 60 MG capsule Commonly known as: CYMBALTA Take 1 capsule by mouth once daily   Ensure Max Protein Liqd Take 330 mLs (11 oz total) by mouth 2 (two) times daily.   feeding supplement (NEPRO CARB STEADY) Liqd Take 237 mLs by mouth 3 (three) times daily as needed (Supplement).   feeding supplement (PRO-STAT SUGAR FREE 64) Liqd Take 30 mLs by mouth 2 (two) times daily.   guaiFENesin 600 MG 12 hr tablet Commonly known as: MUCINEX Take 1 tablet (600 mg total) by mouth 2 (two) times daily.   hydrochlorothiazide 25 MG tablet Commonly known as: HYDRODIURIL Take 1 tablet (25 mg total) by mouth daily. RESUME AFTER EVALUATED BY PCP What changed: additional instructions   HYDROcodone-acetaminophen 5-325 MG tablet Commonly known as: NORCO/VICODIN Take 1 tablet by mouth every 6 (six) hours as needed for moderate pain. What changed: Another medication with the same name was removed. Continue taking this medication, and follow the directions you see here.   Insulin Glargine 100 UNIT/ML Solostar Pen Commonly known as: Lantus SoloStar INJECT 20 UNITS SUBCUTANEOUSLY ONCE DAILY IN THE MORNING AND THEN INJECT 40 UNITS AT BEDTIME What changed:   how much to take  how to take this  when to take this  additional instructions   Insulin Lispro 200 UNIT/ML Sopn Commonly known as: HumaLOG KwikPen Inject 13 Units into the skin daily. What changed: Another medication with the same name was removed. Continue taking this medication, and follow the directions you see here.   Iron 325 (65 Fe) MG Tabs Take 1 tablet by mouth  daily.   Multivitamin Gummies Womens Google 2 each by mouth daily.   nystatin powder Commonly known as: MYCOSTATIN/NYSTOP Apply  topically 3 (three) times daily.   ondansetron 4 MG disintegrating tablet Commonly known as: Zofran ODT Take 1 tablet (4 mg total) by mouth every 8 (eight) hours as needed for nausea or vomiting. What changed: Another medication with the same name was removed. Continue taking this medication, and follow the directions you see here.   polyethylene glycol 17 g packet Commonly known as: MIRALAX / GLYCOLAX Take 17 g by mouth daily as needed for mild constipation.   pregabalin 150 MG capsule Commonly known as: LYRICA TAKE 1 CAPSULE BY MOUTH THREE TIMES DAILY What changed:   when to take this  Another medication with the same name was removed. Continue taking this medication, and follow the directions you see here.   vancomycin  IVPB Inject 750 mg into the vein daily for 14 days. Indication:   MSSA R elbow synovitis Last Day of Therapy:  05/05/2019 Labs - Wednesday:  CBC/D, BMP, and vancomycin trough. Labs - Every other week:  ESR and CRP            Home Infusion Instuctions  (From admission, onward)         Start     Ordered   04/21/19 0000  Home infusion instructions [facility] May follow [place] Dosing Protocol; May administer Cathflo as needed to maintain patency of vascular access device.; Flushing of vascular access device: per [facility] Protocol: 0.9% NaCl pre/post medica...    Question Answer Comment  Instructions May follow [place] Dosing Protocol   Instructions May administer Cathflo as needed to maintain patency of vascular access device.   Instructions Flushing of vascular access device: per [facility] Protocol: 0.9% NaCl pre/post medication administration and prn patency; Heparin 100 u/ml, 61m for implanted ports and Heparin 10u/ml, 569mfor all other central venous catheters.   Instructions May follow [facility] Anaphylaxis Protocol for  First Dose Administration in the home: 0.9% NaCl at 25-50 ml/hr to maintain IV access for protocol meds. Epinephrine 0.3 ml IV/IM PRN and Benadryl 25-50 IV/IM PRN s/s of anaphylaxis.   Instructions [facility] Infusion Coordinator (RN) to assist per patient IV care needs in the home PRN.      04/21/19 1553         Follow-up Information    ChMalena CatholicMD. Schedule an appointment as soon as possible for a visit in 10 days.   Contact information: [id number]      [facility] Follow up.   Why: iv abx supplies;they will call you with further instruction.[phone]         Allergies  Allergen Reactions  . Bee Pollen Anaphylaxis  . Fish-Derived Products Hives, Shortness Of Breath, Swelling and Rash    Hives get in throat causing trouble breathing  . Mushroom Extract Complex Anaphylaxis  . Penicillins Anaphylaxis    Did it involve swelling of the face/tongue/throat, SOB, or low BP? Yes Did it involve sudden or severe rash/hives, skin peeling, or any reaction on the inside of your mouth or nose? No Did you need to seek medical attention at a hospital or doctor's office? Yes When did it last happen?A few months ago If all above answers are "NO", may proceed with cephalosporin use.  . [name] Kitchenosemary Oil Anaphylaxis  . Shellfish Allergy Hives, Shortness Of Breath, Swelling and Rash  . Tomato Hives and Shortness Of Breath    Hives in throat causes her trouble breathing  . Acetaminophen Other (See Comments)  GI upset  . Acyclovir And Related Other (See Comments)    Unknown rxn  . Aloe Vera Hives  . Broccoli [Brassica Oleracea Italica] Hives  . Naproxen Other (See Comments)    Unknown rxn   Consultations:  Orthopedic Surgery  Infectious Diseases  Procedures/Studies: Dg Elbow 2 Views Right  Result Date: 04/14/2019 CLINICAL DATA:  Right upper extremity pain post fall. EXAM: RIGHT ELBOW - 2 VIEW COMPARISON:  None. FINDINGS: There is  no evidence of acute fracture, or dislocation. Extensive posttraumatic/postsurgical changes in the right elbow with absence of the radial head and exuberant enthesophytes formation. Elbow effusion cannot be excluded. IMPRESSION: 1. No acute fracture or dislocation identified about the right elbow. 2. Extensive posttraumatic/postsurgical changes in the right elbow. 3. Right elbow effusion cannot be excluded. Electronically Signed   By: Fidela Salisbury M.D.   On: 04/14/2019 14:38   Dg Forearm Right  Result Date: 04/14/2019 CLINICAL DATA:  Right upper arm pain since a fall 2 days ago. Initial encounter. EXAM: RIGHT FOREARM - 2 VIEW COMPARISON:  Plain films right elbow 03/25/2015. FINDINGS: No acute bony or joint abnormality is identified. 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 There is advanced for age degenerative change about the elbow. IMPRESSION: No acute abnormality. 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 Electronically Signed   By: Inge Rise M.D.   On: 04/14/2019 14:33   Dg Wrist Complete Right  Result Date: 04/14/2019 CLINICAL DATA:  Right wrist pain since a fall 2 days ago. Initial encounter. EXAM: RIGHT WRIST - COMPLETE 3+ VIEW COMPARISON:  None. FINDINGS: There is no evidence of fracture or dislocation. Scattered mild degenerative disease about the wrist is noted. Soft tissues are unremarkable. IMPRESSION: No acute abnormality. Electronically Signed   By: Inge Rise M.D.   On: 04/14/2019 14:33   US Renal  Result Date: 04/16/2019 CLINICAL DATA:  Acute renal insufficiency. EXAM: RENAL / URINARY TRACT ULTRASOUND COMPLETE COMPARISON:  CT scan January 12, 2017 FINDINGS: Right Kidney: Renal measurements: 11.1 x 5.4 x 5.2 cm = volume: 164 mL . Echogenicity within normal limits. No mass or hydronephrosis visualized. Left Kidney: Renal measurements: 10.4 x 4.9 x 5.1 cm = volume:  135 mL. Echogenicity within normal limits. No mass or hydronephrosis visualized. Bladder: Appears normal for degree of bladder distention. IMPRESSION: Normal study. Electronically Signed   By: Dorise Bullion III M.D   On: 04/16/2019 08:27   Ir Cyndy Freeze Guide Cv Line Right  Result Date: 04/21/2019 INDICATION: 63 year old female with renal insufficiency in need of 2 weeks of outpatient intravenous antibiotics. She presents for placement of a tunneled central line. EXAM: IR ULTRASOUND GUIDANCE VASC ACCESS RIGHT; IR RIGHT FLUORO GUIDE CV LINE MEDICATIONS: None ANESTHESIA/SEDATION: None FLUOROSCOPY TIME:  Fluoroscopy Time: 0 minutes 24 seconds (9 mGy). COMPLICATIONS: None immediate. PROCEDURE: Informed written consent was obtained from the patient after a thorough discussion of the procedural risks, benefits and alternatives. All questions were addressed. Maximal Sterile Barrier Technique was utilized including caps, mask, sterile gowns, sterile gloves, sterile drape, hand hygiene and skin antiseptic. A timeout was performed prior to the initiation of the procedure. The right internal jugular vein was interrogated with ultrasound and found to be widely patent. An image was obtained and stored for the medical record. Local anesthesia was attained by infiltration with 1% lidocaine. A small dermatotomy was made. Under real-time sonographic guidance, the vessel  was punctured with a 21 gauge micropuncture needle. Using standard technique, the initial micro needle was exchanged over a 0.018 micro wire for a peel-away sheath. A suitable skin exit site 5 cm inferior to the clavicle was selected. Local anesthesia was attained by infiltration with 1% lidocaine. A small dermatotomy was made. A single lumen power injectable tunneled central line was then tunneled from the skin exit site to the dermatotomy overlying the vascular access site. The catheter was then trimmed to 22 cm. The catheter was advanced through the peel-away  sheath. The peel-away sheath was removed and discarded. A fluoroscopic image was obtained and stored documenting the location of the catheter tip at the superior cavoatrial junction. The catheter was then flushed with sterile saline and secured to the skin with 0 Prolene suture. Sterile bandages were applied. IMPRESSION: Successful placement of a right IJ approach tunneled central venous catheter. The catheter tip is at the superior cavoatrial junction and is ready for immediate use. Signed, Criselda Peaches, MD, [facility] Radiology Electronically Signed   By: Jacqulynn Cadet M.D.   On: 04/21/2019 17:01   Ir US Guide Vasc Access Right  Result Date: 04/21/2019 INDICATION: 63 year old female with renal insufficiency in need of 2 weeks of outpatient intravenous antibiotics. She presents for placement of a tunneled central line. EXAM: IR ULTRASOUND GUIDANCE VASC ACCESS RIGHT; IR RIGHT FLUORO GUIDE CV LINE MEDICATIONS: None ANESTHESIA/SEDATION: None FLUOROSCOPY TIME:  Fluoroscopy Time: 0 minutes 24 seconds (9 mGy). COMPLICATIONS: None immediate. PROCEDURE: Informed written consent was obtained from the patient after a thorough discussion of the procedural risks, benefits and alternatives. All questions were addressed. Maximal Sterile Barrier Technique was utilized including caps, mask, sterile gowns, sterile gloves, sterile drape, hand hygiene and skin antiseptic. A timeout was performed prior to the initiation of the procedure. The right internal jugular vein was interrogated with ultrasound and found to be widely patent. An image was obtained and stored for the medical record. Local anesthesia was attained by infiltration with 1% lidocaine. A small dermatotomy was made. Under real-time sonographic guidance, the vessel was punctured with a 21 gauge micropuncture needle. Using standard technique, the initial micro needle was exchanged over a 0.018 micro  wire for a peel-away sheath. A suitable skin exit site 5 cm inferior to the clavicle was selected. Local anesthesia was attained by infiltration with 1% lidocaine. A small dermatotomy was made. A single lumen power injectable tunneled central line was then tunneled from the skin exit site to the dermatotomy overlying the vascular access site. The catheter was then trimmed to 22 cm. The catheter was advanced through the peel-away sheath. The peel-away sheath was removed and discarded. A fluoroscopic image was obtained and stored documenting the location of the catheter tip at the superior cavoatrial junction. The catheter was then flushed with sterile saline and secured to the skin with 0 Prolene suture. Sterile bandages were applied. IMPRESSION: Successful placement of a right IJ approach tunneled central venous catheter. The catheter tip is at the superior cavoatrial junction and is ready for immediate use. Signed, Criselda Peaches, MD, [place] [facility] Radiology Electronically Signed   By: Jacqulynn Cadet M.D.   On: 04/21/2019 17:01   Dg Chest Port 1 View  Result Date: 04/20/2019 CLINICAL DATA:  Dyspnea EXAM: PORTABLE CHEST 1 VIEW COMPARISON:  Chest radiograph from one day prior. FINDINGS: Stable cardiomediastinal silhouette with top-normal heart size. No pneumothorax. No pleural effusion. Lungs appear  clear, with no acute consolidative airspace disease and no pulmonary edema. IMPRESSION: No active disease. Electronically Signed   By: Ilona Sorrel M.D.   On: 04/20/2019 07:18   Dg Chest Port 1 View  Result Date: 04/19/2019 CLINICAL DATA:  Dyspnea EXAM: PORTABLE CHEST 1 VIEW COMPARISON:  04/17/2019 chest radiograph. FINDINGS: Stable cardiomediastinal silhouette with normal heart size. No pneumothorax. No pleural effusion. Lungs appear clear, with no acute consolidative airspace disease and no pulmonary edema. IMPRESSION: No active disease. Electronically  Signed   By: Ilona Sorrel M.D.   On: 04/19/2019 06:33   Dg Chest Port 1 View  Result Date: 04/17/2019 CLINICAL DATA:  Shortness of breath. EXAM: PORTABLE CHEST 1 VIEW COMPARISON:  04/16/2019 and older exams. FINDINGS: Cardiac silhouette is normal in size. No mediastinal or hilar masses. No evidence of adenopathy. Clear lungs.  No pleural effusion or pneumothorax. Old proximal left humerus and left clavicle fractures, stable. IMPRESSION: No active disease. Electronically Signed   By: Lajean Manes M.D.   On: 04/17/2019 08:40   Dg Chest Port 1 View  Result Date: 04/16/2019 CLINICAL DATA:  Shortness of breath.  Hypoxia. EXAM: PORTABLE CHEST 1 VIEW COMPARISON:  April 15, 2019 FINDINGS: The heart size and mediastinal contours are within normal limits. Both lungs are clear. The visualized skeletal structures are unremarkable. IMPRESSION: No active disease. Electronically Signed   By: Dorise Bullion III M.D   On: 04/16/2019 15:52   Dg Chest Port 1 View  Result Date: 04/15/2019 CLINICAL DATA:  Chest pain.  Hypoxia. EXAM: PORTABLE CHEST 1 VIEW COMPARISON:  Chest CT dated 12/03/2018 and chest x-ray dated 11/26/2018 FINDINGS: Heart size and pulmonary vascularity are normal. Lungs are clear. Prominent left pericardial fat pad as demonstrated on the prior CT scan. Old healed fracture of the left clavicle. No acute bone abnormality. IMPRESSION: No acute abnormalities. Electronically Signed   By: Lorriane Shire M.D.   On: 04/15/2019 12:25   Dg Humerus Right  Result Date: 04/14/2019 CLINICAL DATA:  Right arm pain since a fall 2 days ago. Initial encounter. EXAM: RIGHT HUMERUS - 2+ VIEW COMPARISON:  Plain films right elbow 03/25/2015. FINDINGS: No acute bony or joint abnormality is identified. Posttraumatic change about the right elbow is identified as seen on the prior exam. IMPRESSION: No acute finding. Electronically Signed   By: Inge Rise M.D.   On: 04/14/2019 14:34   Dg Foot Complete Left  Result  Date: 04/17/2019 CLINICAL DATA:  Diabetic foot ulcer EXAM: LEFT FOOT - COMPLETE 3+ VIEW COMPARISON:  04/06/2017 FINDINGS: Resection of the posterior calcaneus appears healed without evidence of acute osteomyelitis. Negative for fracture. No acute osteomyelitis elsewhere Plantar ulcer with soft tissue swelling has improved since the prior study. IMPRESSION: Plantar foot ulcer. Negative for osteomyelitis. Postop resection posterior calcaneus. Electronically Signed   By: Franchot Gallo M.D.   On: 04/17/2019 14:54   Vas [name] Burnard Bunting With/wo Tbi  Result Date: 04/17/2019 LOWER EXTREMITY DOPPLER STUDY Indications: Ulceration. High Risk Factors: Hypertension, Diabetes, coronary artery disease.  Comparison Study: Prior normal study from 04/08/17 is available for comparison. Performing Technologist: Sharion Dove RVS  Examination Guidelines: A complete evaluation includes at minimum, Doppler waveform signals and systolic blood pressure reading at the level of bilateral brachial, anterior tibial, and posterior tibial arteries, when vessel segments are accessible. Bilateral testing is considered an integral part of a complete examination. Photoelectric Plethysmograph (PPG) waveforms and toe systolic pressure readings are included as required and additional duplex testing as needed.  Limited examinations for reoccurring indications may be performed as noted.  ABI Findings: +---------+------------------+-----+--------+---------------------------------+ Right    Rt Pressure (mmHg)IndexWaveformComment                           +---------+------------------+-----+--------+---------------------------------+ Brachial                                restricted, had elbow surgery                                             today                             +---------+------------------+-----+--------+---------------------------------+ PTA      131               1.12 biphasic                                   +---------+------------------+-----+--------+---------------------------------+ DP       128               1.09 biphasic                                  +---------+------------------+-----+--------+---------------------------------+ Great Toe93                0.79                                           +---------+------------------+-----+--------+---------------------------------+ +---------+------------------+-----+---------+----------+ Left     Lt Pressure (mmHg)IndexWaveform Comment    +---------+------------------+-----+---------+----------+ Brachial 117                    triphasic           +---------+------------------+-----+---------+----------+ PTA      127               1.09 biphasic            +---------+------------------+-----+---------+----------+ DP       143               1.22 biphasic            +---------+------------------+-----+---------+----------+ Great Toe                                ulceration +---------+------------------+-----+---------+----------+ +-------+-----------+-----------+------------+------------+ ABI/TBIToday's ABIToday's TBIPrevious ABIPrevious TBI +-------+-----------+-----------+------------+------------+ Right  1.1        0.79       1.1                      +-------+-----------+-----------+------------+------------+ Left   1.2                   1.1                      +-------+-----------+-----------+------------+------------+ Right ABIs appear essentially unchanged. Left ABIs appear essentially unchanged.  Summary: Right: Resting right ankle-brachial index is within normal  range. No evidence of significant right lower extremity arterial disease. The right toe-brachial index is normal. Left: Resting left ankle-brachial index is within normal range. No evidence of significant left lower extremity arterial disease.  *See table(s) above for measurements and observations.  Electronically signed by Servando Snare MD  on 04/17/2019 at 4:18:08 PM.    Final    Ct Extrem Up Entire Arm R Wo/cm  Result Date: 04/15/2019 CLINICAL DATA:  [place] 3 days ago.  Persistent arm pain. EXAM: CT OF THE UPPER RIGHT EXTREMITY WITHOUT CONTRAST TECHNIQUE: Multidetector CT imaging of the upper right extremity was performed according to the standard protocol. COMPARISON:  Radiographs 04/14/2019 FINDINGS: The shoulder joint is maintained. No acute fracture of the humeral head or neck is identified. The humeral shaft is intact. No fracture. Severe degenerative changes are noted at the elbow joint with a remote ununited displaced fracture through the radial neck. No definite acute elbow fracture. Significant breathing motion artifact is noted but I do not see any significant pulmonary abnormality other than patchy areas of atelectasis. The visualized ribs appear intact. No obvious rib fractures. IMPRESSION: 1. No acute fracture of the humerus is identified. 2. 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 Associated posttraumatic degenerative changes at the joint. Electronically Signed   By: Marijo Sanes M.D.   On: 04/15/2019 14:55    Subjective: Seen And examined at bedside was doing well denies anticoagulants or dizziness.  No nausea or vomiting.  States that she wants to go home to her dog.  No other concerns reported at this time are detailed for discharge after her central venous catheters placed.  Discharge Exam: Vitals:   04/21/19 0526 04/21/19 1322  BP: (!) 159/68 (!) 145/71  Pulse: 71 69  Resp: 18 16  Temp: 97.7 F (36.5 C) 98 F (36.7 C)  SpO2: 97% 99%   Vitals:   04/20/19 2053 04/21/19 0125 04/21/19 0526 04/21/19 1322  BP: (!) 173/79 (!) 153/65 (!) 159/68 (!) 145/71  Pulse: 76  71 69  Resp: 18  18 16   Temp: 97.9 F (36.6 C)  97.7 F (36.5 C) 98 F (36.7 C)  TempSrc: Oral  Oral Oral  SpO2: 98%  97% 99%  Weight:   87.7 kg   Height:       General: Pt is alert,  awake, not in acute distress Cardiovascular: RRR, S1/S2 +, no rubs, no gallops Respiratory: Diminished bilaterally, no wheezing, no rhonchi; Unlabored breathing Abdominal: Soft, NT, Distended , bowel sounds + Extremities: Mild edema, no cyanosis; Right Arm in Cast  The results of significant diagnostics from this hospitalization (including imaging, microbiology, ancillary and laboratory) are listed below for reference.    Microbiology: Recent Results (from the past 240 hour(s))  SARS Coronavirus 2 (Hosp order,Performed in [facility] lab via Abbott ID)     Status: None   Collection Time: 04/15/19 11:14 AM   Specimen: Dry Nasal Swab (Abbott ID Now)  Result Value Ref Range Status   SARS Coronavirus 2 (Abbott ID Now) NEGATIVE NEGATIVE Final    Comment: (NOTE) Interpretive Result Comment(s): COVID 19 Positive SARS CoV 2 target nucleic acids are DETECTED. The SARS CoV 2 RNA is generally detectable in upper and lower respiratory specimens during the acute phase of infection.  Positive results are indicative of active infection with SARS CoV 2.  Clinical correlation with patient history and other diagnostic information is necessary to determine patient infection status.  Positive results  do not rule out bacterial infection or coinfection with other viruses. The expected result is Negative. COVID 19 Negative SARS CoV 2 target nucleic acids are NOT DETECTED. The SARS CoV 2 RNA is generally detectable in upper and lower respiratory specimens during the acute phase of infection.  Negative results do not preclude SARS CoV 2 infection, do not rule out coinfections with other pathogens, and should not be used as the sole basis for treatment or other patient management decisions.  Negative results must be combined with clinical  observations, patient history, and epidemiological information. The expected result is Negative. Invalid Presence or absence of SARS CoV 2 nucleic acids cannot  be determined. Repeat testing was performed on the submitted specimen and repeated Invalid results were obtained.  If clinically indicated, additional testing on a new specimen with an alternate test methodology [phone]) is advised.  The SARS CoV 2 RNA is generally detectable in upper and lower respiratory specimens during the acute phase of infection. The expected result is Negative. Fact Sheet for Patients:  GolfingFamily.no Fact Sheet for Healthcare Providers: https://www.hernandez-brewer.com/ This test is not yet approved or cleared by the [place] FDA and has been authorized for detection and/or diagnosis of SARS CoV 2 by FDA under an Emergency Use Authorization (EUA).  This EUA will remain in effect (meaning this test can be used) for the duration of the COVID19 d eclaration under Section 564(b)(1) of the Act, 21 U.S.C. section [phone] 3(b)(1), unless the authorization is terminated or revoked sooner. Performed at [facility], [address] [place], [place] [address]   Body fluid culture     Status: Abnormal   Collection Time: 04/16/19  9:00 PM   Specimen: Synovium; Body Fluid  Result Value Ref Range Status   Specimen Description   Final    SYNOVIAL RIGHT ELBOW Performed at [place] [address], [place], [place] [address]    Special Requests   Final    Immunocompromised Performed at [facility], [place] [address] [place], [place] [address]    Gram Stain   Final    FEW WBC PRESENT, PREDOMINANTLY PMN MODERATE GRAM POSITIVE COCCI Performed at [facility], [place] [address], [place], [place] [address]    Culture STAPHYLOCOCCUS AUREUS (A)  Final   Report Status 04/19/2019 FINAL  Final   Organism ID, Bacteria STAPHYLOCOCCUS AUREUS  Final      Susceptibility   Staphylococcus aureus - MIC*    CIPROFLOXACIN <=0.5 SENSITIVE Sensitive     ERYTHROMYCIN 0.5 SENSITIVE Sensitive      GENTAMICIN <=0.5 SENSITIVE Sensitive     OXACILLIN 0.5 SENSITIVE Sensitive     TETRACYCLINE <=1 SENSITIVE Sensitive     VANCOMYCIN 1 SENSITIVE Sensitive     TRIMETH/SULFA <=10 SENSITIVE Sensitive     CLINDAMYCIN <=0.25 SENSITIVE Sensitive     RIFAMPIN <=0.5 SENSITIVE Sensitive     Inducible Clindamycin NEGATIVE Sensitive     * STAPHYLOCOCCUS AUREUS  MRSA PCR Screening     Status: None   Collection Time: 04/16/19 10:01 PM   Specimen: Nasal Mucosa; Nasopharyngeal  Result Value Ref Range Status   MRSA by PCR NEGATIVE NEGATIVE Final    Comment:        The GeneXpert MRSA Assay (FDA approved for NASAL specimens only), is one component of a comprehensive MRSA colonization surveillance program. It is not intended to diagnose MRSA infection nor to guide or monitor treatment for MRSA infections. Performed at  [facility], [place] [address], [place], [place] [address]     Labs: BNP (last 3 results) No results for input(s): BNP in the last 8760 hours. Basic Metabolic Panel: Recent Labs  Lab 04/17/19 0158 04/18/19 0508 04/19/19 0535 04/20/19 0524 04/21/19 0502  NA 128* 131* 137 140 140  K 4.3 3.7 3.8 3.8 4.0  CL 98 103 108 109 107  CO2 19* 20* 20* 23 23  GLUCOSE 307* 172* 100* 95 89  BUN 62* 58* 46* 36* 28*  CREATININE 2.27* 1.92* 1.48* 1.21* 1.06*  CALCIUM 7.9* 8.2* 8.2* 8.5* 8.5*  MG 1.9 2.0 1.9 1.7 1.6*  PHOS 2.5 1.9* 2.8 3.7 3.8   Liver Function Tests: Recent Labs  Lab 04/17/19 0158 04/18/19 0508 04/19/19 0535 04/20/19 0524 04/21/19 0502  AST 34 52* 57* 46* 44*  ALT 24 32 33 31 31  ALKPHOS 78 82 95 98 92  BILITOT 0.8 0.8 0.6 1.0 0.9  PROT 6.3* 6.1* 6.1* 6.4* 6.2*  ALBUMIN 2.4* 2.3* 2.2* 2.3* 2.1*   No results for input(s): LIPASE, AMYLASE in the last 168 hours. No results for input(s): AMMONIA in the last 168 hours. CBC: Recent Labs  Lab 04/17/19 0158 04/18/19 0508 04/19/19 0535 04/20/19 0524 04/21/19 0502  WBC 7.8 8.1 8.7 8.1 8.2   NEUTROABS 6.6 6.3 6.5 5.7 5.5  HGB 9.0* 8.7* 9.3* 9.6* 9.9*  HCT 28.8* 28.0* 29.8* 30.9* 29.8*  MCV 86.5 85.9 86.9 85.8 83.9  PLT 122* 159 230 270 277   Cardiac Enzymes: Recent Labs  Lab 04/16/19 1956 04/17/19 0158 04/17/19 0827  TROPONINI 0.09* 0.09* 0.07*   BNP: Invalid input(s): POCBNP CBG: Recent Labs  Lab 04/20/19 1637 04/20/19 2108 04/21/19 0819 04/21/19 1140 04/21/19 1709  GLUCAP 125* 121* 88 107* 212*   D-Dimer No results for input(s): DDIMER in the last 72 hours. Hgb A1c No results for input(s): HGBA1C in the last 72 hours. Lipid Profile No results for input(s): CHOL, HDL, LDLCALC, TRIG, CHOLHDL, LDLDIRECT in the last 72 hours. Thyroid function studies No results for input(s): TSH, T4TOTAL, T3FREE, THYROIDAB in the last 72 hours.  Invalid input(s): FREET3 Anemia work up No results for input(s): VITAMINB12, FOLATE, FERRITIN, TIBC, IRON, RETICCTPCT in the last 72 hours. Urinalysis    Component Value Date/Time   COLORURINE YELLOW 04/15/2019 1227   APPEARANCEUR CLEAR 04/15/2019 1227   LABSPEC 1.020 04/15/2019 1227   PHURINE 5.5 04/15/2019 1227   GLUCOSEU NEGATIVE 04/15/2019 1227   GLUCOSEU >=1000 (A) 01/11/2017 1335   HGBUR LARGE (A) 04/15/2019 1227   HGBUR moderate 08/05/2010 1048   BILIRUBINUR NEGATIVE 04/15/2019 1227   BILIRUBINUR neg 11/15/2017 1241   KETONESUR NEGATIVE 04/15/2019 1227   PROTEINUR 30 (A) 04/15/2019 1227   UROBILINOGEN 0.2 11/15/2017 1241   UROBILINOGEN 0.2 01/11/2017 1335   NITRITE NEGATIVE 04/15/2019 1227   LEUKOCYTESUR SMALL (A) 04/15/2019 1227   Sepsis Labs Invalid input(s): PROCALCITONIN,  WBC,  LACTICIDVEN Microbiology Recent Results (from the past 240 hour(s))  SARS Coronavirus 2 (Hosp order,Performed in [place] lab via Abbott ID)     Status: None   Collection Time: 04/15/19 11:14 AM   Specimen: Dry Nasal Swab (Abbott ID Now)  Result Value Ref Range Status   SARS Coronavirus 2 (Abbott ID Now) NEGATIVE NEGATIVE  Final    Comment: (NOTE) Interpretive Result Comment(s): COVID 19 Positive SARS CoV 2 target nucleic acids are DETECTED. The SARS CoV 2 RNA is generally detectable in upper and lower respiratory specimens during the  acute phase of infection.  Positive results are indicative of active infection with SARS CoV 2.  Clinical correlation with patient history and other diagnostic information is necessary to determine patient infection status.  Positive results do not rule out bacterial infection or coinfection with other viruses. The expected result is Negative. COVID 19 Negative SARS CoV 2 target nucleic acids are NOT DETECTED. The SARS CoV 2 RNA is generally detectable in upper and lower respiratory specimens during the acute phase of infection.  Negative results do not preclude SARS CoV 2 infection, do not rule out coinfections with other pathogens, and should not be used as the sole basis for treatment or other patient management decisions.  Negative results must be combined with clinical  observations, patient history, and epidemiological information. The expected result is Negative. Invalid Presence or absence of SARS CoV 2 nucleic acids cannot be determined. Repeat testing was performed on the submitted specimen and repeated Invalid results were obtained.  If clinically indicated, additional testing on a new specimen with an alternate test methodology [phone]) is advised.  The SARS CoV 2 RNA is generally detectable in upper and lower respiratory specimens during the acute phase of infection. The expected result is Negative. Fact Sheet for Patients:  GolfingFamily.no Fact Sheet for Healthcare Providers: https://www.hernandez-brewer.com/ This test is not yet approved or cleared by the [place] FDA and has been authorized for detection and/or diagnosis of SARS CoV 2 by FDA under an Emergency Use Authorization (EUA).  This EUA will remain in  effect (meaning this test can be used) for the duration of the COVID19 d eclaration under Section 564(b)(1) of the Act, 21 U.S.C. section 9898111488 3(b)(1), unless the authorization is terminated or revoked sooner. Performed at [facility], [address] [place], [place] [address]   Body fluid culture     Status: Abnormal   Collection Time: 04/16/19  9:00 PM   Specimen: Synovium; Body Fluid  Result Value Ref Range Status   Specimen Description   Final    SYNOVIAL RIGHT ELBOW Performed at [place] [address], [place], [place] [address]    Special Requests   Final    Immunocompromised Performed at [facility], [place] [address] [place], [place] [address]    Gram Stain   Final    FEW WBC PRESENT, PREDOMINANTLY PMN MODERATE GRAM POSITIVE COCCI Performed at [facility], [place] [address], [place], [place] [address]    Culture STAPHYLOCOCCUS AUREUS (A)  Final   Report Status 04/19/2019 FINAL  Final   Organism ID, Bacteria STAPHYLOCOCCUS AUREUS  Final      Susceptibility   Staphylococcus aureus - MIC*    CIPROFLOXACIN <=0.5 SENSITIVE Sensitive     ERYTHROMYCIN 0.5 SENSITIVE Sensitive     GENTAMICIN <=0.5 SENSITIVE Sensitive     OXACILLIN 0.5 SENSITIVE Sensitive     TETRACYCLINE <=1 SENSITIVE Sensitive     VANCOMYCIN 1 SENSITIVE Sensitive     TRIMETH/SULFA <=10 SENSITIVE Sensitive     CLINDAMYCIN <=0.25 SENSITIVE Sensitive     RIFAMPIN <=0.5 SENSITIVE Sensitive     Inducible Clindamycin NEGATIVE Sensitive     * STAPHYLOCOCCUS AUREUS  MRSA PCR Screening     Status: None   Collection Time: 04/16/19 10:01 PM   Specimen: Nasal Mucosa; Nasopharyngeal  Result Value Ref Range Status   MRSA by PCR NEGATIVE NEGATIVE Final    Comment:        The GeneXpert MRSA Assay (  FDA approved for NASAL specimens only), is one component of a comprehensive MRSA colonization surveillance program. It is not intended to diagnose  MRSA infection nor to guide or monitor treatment for MRSA infections. Performed at [facility], [place] [address], [place], [place] [address]    Time coordinating discharge: 35 minutes  SIGNED:  Kerney Elbe, DO [facility] 04/22/2019, 9:56 PM Pager is on [place]  If 7PM-7AM, please contact night-coverage [email or web address] Password [email or web address]

## 2019-04-21 NOTE — Progress Notes (Signed)
[name], female   DOB: 1956/08/01, 63 y.o.   MRN: [id number]          [name] [name] for Infectious Disease    Date of Admission:  04/15/2019   Day 6 vancomycin         [name] [name] is currently out of her room for central line placement but it appears from other notes that she is improving on therapy for MSSA right elbow septic arthritis.  I plan on a minimum of 14 days of IV vancomycin (she has a serious beta-lactam allergy).  Diagnosis: Right elbow septic arthritis  Culture Result: MSSA  Allergies  Allergen Reactions  . Bee Pollen Anaphylaxis  . Fish-Derived Products Hives, Shortness Of Breath, Swelling and Rash    Hives get in throat causing trouble breathing  . Mushroom Extract Complex Anaphylaxis  . Penicillins Anaphylaxis    Did it involve swelling of the face/tongue/throat, SOB, or low BP? Yes Did it involve sudden or severe rash/hives, skin peeling, or any reaction on the inside of your mouth or nose? No Did you need to seek medical attention at a hospital or doctor's office? Yes When did it last happen?A few months ago If all above answers are "NO", may proceed with cephalosporin use.  [name] [name] Anaphylaxis  . Shellfish Allergy Hives, Shortness Of Breath, Swelling and Rash  . Tomato Hives and Shortness Of Breath    Hives in throat causes her trouble breathing  . Acetaminophen Other (See Comments)    GI upset  . Acyclovir And Related Other (See Comments)    Unknown rxn  . Aloe Vera Hives  . Broccoli [Brassica Oleracea Italica] Hives  . Naproxen Other (See Comments)    Unknown rxn    OPAT Orders Discharge antibiotics: Per pharmacy protocol vancomycin Aim for Vancomycin trough 15-20 (unless otherwise indicated) Duration: 2 weeks End Date: 04/29/2019  [facility] Care Per Protocol:  Labs weekly while on IV antibiotics: _x_ CBC with differential _x_ BMP __ CMP _x_ CRP _x_ ESR _x_ Vancomycin trough __ CK  _NA_ Please pull PIC at  completion of IV antibiotics __ Please leave PIC in place until doctor has seen patient or been notified  Fax weekly labs to [phone]  Clinic Follow Up Appt: 04/28/2019         Michel Bickers, [place] for [name] [name] [phone] pager   [phone] cell 04/21/2019, 4:47 PM

## 2019-04-21 NOTE — Progress Notes (Signed)
AVS reviewed with patient's husband per patient's request.  Verbalized understanding of discharge instructions, physician follow-up, medications.  Vancomycin prescription given to patient.  Patient's central line site WNL and disc in place at time of discharge.  Patient transported via wheelchair to main entrance at discharge.  Patient stable at time of discharge.

## 2019-04-21 NOTE — Progress Notes (Signed)
Patient's PIV removed.  Site WNL.

## 2019-04-21 NOTE — Progress Notes (Signed)
Patient called RN to room.  States she needed, "Something to throw up in and something for nausea".  RN entered room.  Patient reported she had broccoli on her lunch tray and states she cannot eat broccoli due to allergy (hives) and cannot stand the smell.  Tray had already been removed from room.  Zofran given.  Broccoli added to allergy list.

## 2019-04-21 NOTE — Progress Notes (Signed)
PHARMACY CONSULT NOTE FOR:  OUTPATIENT  PARENTERAL ANTIBIOTIC THERAPY (OPAT)  Indication:  MSSA R elbow synovitis Regimen: vancomycin 750 mg IV q24h End date: 05/05/2019  IV antibiotic discharge orders are pended. To discharging provider:  please sign these orders via discharge navigator,  Select New Orders & click on the button choice - Manage This Unsigned Work.     Thank you for allowing pharmacy to be a part of this patient's care.  Lynelle Doctor 04/21/2019, 8:52 AM

## 2019-04-22 ENCOUNTER — Telehealth (HOSPITAL_COMMUNITY): Payer: Self-pay

## 2019-04-22 NOTE — Telephone Encounter (Signed)
Follow up telephone call post discharge home. Husband states everything is going well. Home health services visit scheduled for today to begin IV infusion therapy and teaching. Husband states he is going to make her follow up appointment with Dr [name] today and will call me back when he has appointment scheduled. Denies other questions or barriers at this time.

## 2019-04-23 ENCOUNTER — Telehealth: Payer: Self-pay | Admitting: *Deleted

## 2019-04-23 NOTE — Telephone Encounter (Signed)
LVM

## 2019-04-25 NOTE — Telephone Encounter (Signed)
Was able to reach pt husband. States she is doing okay and he will have her call our office

## 2019-04-25 NOTE — Telephone Encounter (Signed)
LVM for pt to call office and schedule follow up appt.

## 2019-04-25 NOTE — Telephone Encounter (Signed)
Transition Care Management Follow-up Telephone Call   How have you been since you were released from the hospital? "Doing okay. My elbow hurts"   Do you understand why you were in the hospital? yes   Do you understand the discharge instructions? yes   Where were you discharged to? home   Items Reviewed:  Medications reviewed: pt does not have list with her right now. Pt states husband gives her medications.  Allergies reviewed: yes  Dietary changes reviewed: yes  Referrals reviewed: yes   Functional Questionnaire:   Activities of Daily Living (ADLs):   She states they are independent in the following: husband assists her with everything   Any transportation issues/concerns?: no   Any patient concerns? Feels like she has to depend on husband so much. Wishes her insurance would cover nurse aid.   Confirmed importance and date/time of follow-up visits scheduled yes  Provider Appointment booked with PCP 04/29/19  Confirmed with patient if condition begins to worsen call PCP or go to the ER.  Patient was given the office number and encouraged to call back with question or concerns.  : yes

## 2019-04-28 ENCOUNTER — Other Ambulatory Visit: Payer: Self-pay

## 2019-04-28 ENCOUNTER — Encounter: Payer: Self-pay | Admitting: Internal Medicine

## 2019-04-28 ENCOUNTER — Telehealth: Payer: Self-pay

## 2019-04-28 ENCOUNTER — Ambulatory Visit: Payer: PRIVATE HEALTH INSURANCE | Admitting: Internal Medicine

## 2019-04-28 DIAGNOSIS — M00021 Staphylococcal arthritis, right elbow: Secondary | ICD-10-CM

## 2019-04-28 MED ORDER — VANCOMYCIN IV (FOR PTA / DISCHARGE USE ONLY): 750.0000 mg | INTRAVENOUS | Status: AC

## 2019-04-28 MED ORDER — DOXYCYCLINE HYCLATE 100 MG PO TABS: 100.0000 mg | ORAL_TABLET | Freq: Two times a day (BID) | ORAL | 0 refills | Status: DC

## 2019-04-28 NOTE — Progress Notes (Signed)
Virtual Visit via Telephone Note  I connected with [name] [name] on 04/28/19 at 11:30 AM EDT by telephone and verified that I am speaking with the correct person using two identifiers.  Location: Patient: Home Provider: [facility]   I discussed the limitations, risks, security and privacy concerns of performing an evaluation and management service by telephone and the availability of in person appointments. I also discussed with the patient that there may be a patient responsible charge related to this service. The patient expressed understanding and agreed to proceed.   History of Present Illness: I called and spoke with [name] by phone today.  She is a 63 y.o. female with multiple medical problems including poorly controlled diabetes.  In January 2016 she fell and suffered a fracture dislocation of the head of her right radius.  She underwent arthroplasty but developed early postoperative infection due to methicillin sensitive coagulase-negative staph.  She had incision and drainage with hardware removal and placement of an external fixator.  She was treated with IV antibiotics.  Based on notes from [facility] it appears that she had a serious allergic reaction to cefazolin and completed 6 weeks of therapy with clindamycin.  She recently fell and struck her right elbow about 10 days ago.  She began having fever, chills and increasing right elbow pain leading to admission on 04/15/2019.  Her temperature was 100.9 degrees.  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 Associated posttraumatic degenerative changes at the joint".  Arthrocentesis yielded 10 cc of pus.  The specimen clotted so no white blood cell count was performed but there was a predominance of segmented neutrophils.  No crystals were seen.  Gram stain of synovial fluid shows gram-positive cocci and cultures grew MSSA.  She underwent incision  and drainage.  She has a history of serious beta-lactam allergy.  She was discharged on IV vancomycin and has now completed 13 days of total antibiotic therapy.  She is still having pain and some serous drainage from her incision but overall is feeling better.  She has not had any problems tolerating her vancomycin or PICC.   Observations/Objective: Sed Rate (mm/hr)  Date Value  04/16/2019 126 (H)  11/26/2018 32 (H)  04/06/2017 83 (H)   CRP (mg/dL)  Date Value  04/16/2019 32.3 (H)  04/06/2017 13.4 (H)  05/05/2015 0.3 (L)    Assessment and Plan: She seems to be improving on vancomycin for MSSA septic arthritis of her right elbow.  She will complete vancomycin tomorrow and have her PICC removed.  We will give her 2 more weeks of oral doxycycline.  She will follow-up here in 1 month.  Follow Up Instructions: 1. Complete vancomycin tomorrow and have PICC removed 2. Complete antibiotic therapy with doxycycline for 2 more weeks 3. Follow-up here in 1 month   I discussed the assessment and treatment plan with the patient. The patient was provided an opportunity to ask questions and all were answered. The patient agreed with the plan and demonstrated an understanding of the instructions.   The patient was advised to call back or seek an in-person evaluation if the symptoms worsen or if the condition fails to improve as anticipated.  I provided 15 minutes of non-face-to-face time during this encounter.   Michel Bickers, MD

## 2019-04-28 NOTE — Telephone Encounter (Signed)
Per Dr. Megan Salon called [facility] to pull patient's picc after last dose of Vancomycin on 6/30. Spoke with [place] who was able to take verbal order. Order was repeated and confirmed before call was ended. [place]

## 2019-04-29 ENCOUNTER — Other Ambulatory Visit: Payer: Self-pay | Admitting: Family Medicine

## 2019-04-29 ENCOUNTER — Encounter: Payer: PRIVATE HEALTH INSURANCE | Admitting: Family Medicine

## 2019-04-29 ENCOUNTER — Other Ambulatory Visit: Payer: Self-pay

## 2019-04-29 DIAGNOSIS — M79672 Pain in left foot: Secondary | ICD-10-CM

## 2019-04-29 DIAGNOSIS — M797 Fibromyalgia: Secondary | ICD-10-CM

## 2019-04-29 MED ORDER — HYDROCODONE-ACETAMINOPHEN 5-325 MG PO TABS: 1.0000 | ORAL_TABLET | Freq: Four times a day (QID) | ORAL | 0 refills | Status: DC | PRN

## 2019-04-29 NOTE — Telephone Encounter (Signed)
Hydrocodone refill.   Last OV: 03/03/2019 Last Fill: 04/02/2019 #120 and 0RF UDS: 05/06/2018 Low risk

## 2019-04-29 NOTE — Telephone Encounter (Signed)
Medication Refill - Medication: HYDROcodone-acetaminophen (NORCO/VICODIN) 5-325 MG tablet   Has the patient contacted their pharmacy? no  Preferred Pharmacy (with phone number or street name):  [id number] - [place], [place] - [address] [phone] (Phone) [phone] (Fax)         Agent: Please be advised that RX refills may take up to 3 business days. We ask that you follow-up with your pharmacy.

## 2019-05-01 ENCOUNTER — Other Ambulatory Visit: Payer: Self-pay | Admitting: Family Medicine

## 2019-05-01 ENCOUNTER — Telehealth: Payer: Self-pay

## 2019-05-01 ENCOUNTER — Encounter: Payer: Self-pay | Admitting: Family Medicine

## 2019-05-01 ENCOUNTER — Other Ambulatory Visit: Payer: Self-pay

## 2019-05-01 ENCOUNTER — Ambulatory Visit (INDEPENDENT_AMBULATORY_CARE_PROVIDER_SITE_OTHER): Payer: PRIVATE HEALTH INSURANCE | Admitting: Family Medicine

## 2019-05-01 VITALS — BP 128/50 | HR 76 | Temp 98.8°F | Resp 18 | Ht 62.0 in

## 2019-05-01 DIAGNOSIS — M797 Fibromyalgia: Secondary | ICD-10-CM

## 2019-05-01 DIAGNOSIS — N179 Acute kidney failure, unspecified: Secondary | ICD-10-CM

## 2019-05-01 DIAGNOSIS — M00021 Staphylococcal arthritis, right elbow: Secondary | ICD-10-CM

## 2019-05-01 DIAGNOSIS — N189 Chronic kidney disease, unspecified: Secondary | ICD-10-CM

## 2019-05-01 DIAGNOSIS — M79672 Pain in left foot: Secondary | ICD-10-CM

## 2019-05-01 DIAGNOSIS — S42401A Unspecified fracture of lower end of right humerus, initial encounter for closed fracture: Secondary | ICD-10-CM

## 2019-05-01 DIAGNOSIS — J302 Other seasonal allergic rhinitis: Secondary | ICD-10-CM

## 2019-05-01 MED ORDER — FLUTICASONE PROPIONATE 50 MCG/ACT NA SUSP: 2.0000 | Freq: Every day | NASAL | 6 refills | Status: DC

## 2019-05-01 MED ORDER — HYDROCODONE-ACETAMINOPHEN 5-325 MG PO TABS: 1.0000 | ORAL_TABLET | Freq: Four times a day (QID) | ORAL | 0 refills | Status: DC | PRN

## 2019-05-01 NOTE — Telephone Encounter (Signed)
Copied from [place] [id number]. Topic: General - Other >> May 01, 2019 10:36 AM Mcneil, Ja-Kwan wrote: Reason for CRM: Pt stated she needs the Rx for HYDROcodone-acetaminophen (NORCO/VICODIN) 5-325 MG tablet to go to [facility] #[id number] - [place], [place] - [address] AT [place] [phone] (Phone)  [phone] (Fax) because it costs over $100 for her to get it at [facility].

## 2019-05-01 NOTE — Progress Notes (Signed)
[name], female    DOB: Jan 08, 1956  Age: 62 y.o. MRN: [id number]    Subjective:  Subjective  HPI [name] [name] presents for hosp f/u for septic arthritis and acute chronic renal failure.   Pt is also  Review of Systems  Constitutional: Negative for appetite change, diaphoresis, fatigue and unexpected weight change.  HENT: Positive for ear pain and postnasal drip. Negative for ear discharge, rhinorrhea, sinus pressure and sinus pain.   Eyes: Negative for pain, redness and visual disturbance.  Respiratory: Negative for cough, chest tightness, shortness of breath and wheezing.   Cardiovascular: Negative for chest pain, palpitations and leg swelling.  Endocrine: Negative for cold intolerance, heat intolerance, polydipsia, polyphagia and polyuria.  Genitourinary: Negative for difficulty urinating, dysuria and frequency.  Musculoskeletal: Positive for joint swelling.  Skin: Positive for wound.  Neurological: Negative for dizziness, light-headedness, numbness and headaches.    History Past Medical History:  Diagnosis Date   Acute MI ([place]) 2007   presented to ED & had cardiac cath- but found to have normal coronaries. Since that point in time her PCP cares f or cardiac needs. Dr. 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15; doesn't bother me now" (03/20/2013)   Cirrhosis of liver without mention of alcohol    Constipation    Dehiscence of closure of skin    left partial calcaneal excision   Depression    Diabetes mellitus    insulin dependent, adult onset   Episode of visual loss of left eye    Exertional shortness of breath    Fatty liver    Fibromyalgia    GERD (gastroesophageal reflux disease)    Hepatic steatosis    High cholesterol    Hypertension    MRSA (methicillin resistant Staphylococcus aureus)     Neuropathy    lower legs   Osteoarthritis    hands, hips   Proximal humerus fracture 10/15/12   Left   PTSD (post-traumatic stress disorder)    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   Her family history includes Cancer in her father; Colitis in her father; Crohn's disease in her father; Diabetes in her father and mother; Diabetes Mellitus I in her brother; Diabetes Mellitus II in her brother, brother, brother, and brother; Heart attack in her brother; Heart disease in her brother, brother, and father; Hypertension in her mother; Irritable bowel syndrome in her daughter; Kidney disease in her brother, brother, brother, brother, and brother; Leukemia in her father; Liver disease in her brother and brother; Mental illness in her mother.She reports that she has never smoked. She has never used smokeless tobacco. She reports that she does not drink alcohol or use drugs.  Current Outpatient Medications on File Prior to Visit  Medication Sig  Dispense Refill   Amino Acids-Protein Hydrolys (FEEDING SUPPLEMENT, PRO-STAT SUGAR FREE 64,) LIQD Take 30 mLs by mouth 2 (two) times daily. 887 mL 0   aspirin EC 81 MG EC tablet Take 1 tablet (81 mg total) by mouth 2 (two) times daily. 28 tablet 0   atorvastatin (LIPITOR) 10 MG tablet TAKE 1 TABLET BY MOUTH ONCE  DAILY (Patient taking differently: Take 10 mg by mouth daily. ) 90 tablet 1   CALCIUM ALGINATE EX Apply 1 patch topically every other day.     cyclobenzaprine (FLEXERIL) 10 MG tablet Take 1 tablet (10 mg total) by mouth 3 (three) times daily as needed for up to 15 doses for muscle spasms. 15 tablet 0   docusate sodium (COLACE) 100 MG capsule Take 100 mg by mouth daily.     doxycycline (VIBRA-TABS) 100 MG tablet Take 1 tablet (100 mg total) by mouth 2 (two) times daily. 28 tablet 0   DULoxetine (CYMBALTA) 60 MG capsule Take 1 capsule by mouth once daily (Patient taking differently: Take 60 mg by mouth daily. ) 90 capsule 1   Ensure Max Protein (ENSURE MAX PROTEIN) LIQD Take 330 mLs (11 oz total) by mouth 2 (two) times daily. 3300 mL 0   Ferrous Sulfate (IRON) 325 (65 Fe) MG TABS Take 1 tablet by mouth daily.      guaiFENesin (MUCINEX) 600 MG 12 hr tablet Take 1 tablet (600 mg total) by mouth 2 (two) times daily. 10 tablet 0   hydrochlorothiazide (HYDRODIURIL) 25 MG tablet Take 1 tablet (25 mg total) by mouth daily. RESUME AFTER EVALUATED BY PCP 90 tablet 1   Insulin Glargine (LANTUS SOLOSTAR) 100 UNIT/ML Solostar Pen INJECT 20 UNITS SUBCUTANEOUSLY ONCE DAILY IN THE MORNING AND THEN INJECT 40 UNITS AT BEDTIME (Patient taking differently: Inject 20-40 Units into the skin See admin instructions. Inject 20 units subcutaneously in the morning and 40 units at bedtime) 45 mL 1   Insulin Lispro (HUMALOG KWIKPEN) 200 UNIT/ML SOPN Inject 13 Units into the skin daily. 7 pen 3   Multiple Vitamins-Minerals (MULTIVITAMIN GUMMIES WOMENS) CHEW Chew 2 each by mouth daily.     Nutritional Supplements (FEEDING SUPPLEMENT, NEPRO CARB STEADY,) LIQD Take 237 mLs by mouth 3 (three) times daily as needed (Supplement). 2000 mL 0   nystatin (MYCOSTATIN/NYSTOP) powder Apply topically 3 (three) times daily. 15 g 0   ondansetron (ZOFRAN ODT) 4 MG disintegrating tablet Take 1 tablet (4 mg total) by mouth every 8  (eight) hours as needed for nausea or vomiting. 20 tablet 0   polyethylene glycol (MIRALAX / GLYCOLAX) packet Take 17 g by mouth daily as needed for mild constipation.      pregabalin (LYRICA) 150 MG capsule TAKE 1 CAPSULE BY MOUTH THREE TIMES DAILY (Patient taking differently: Take 150 mg by mouth 2 (two) times daily. ) 90 capsule 0   [DISCONTINUED] metFORMIN (GLUCOPHAGE) 1000 MG tablet Take 1,000 mg by mouth 2 (two) times daily with a meal.       [DISCONTINUED] omeprazole (PRILOSEC) 20 MG capsule Take 20 mg by mouth daily.       No current facility-administered medications on file prior to visit.      Objective:  Objective  Physical Exam Vitals signs and nursing note reviewed.  Constitutional:      Appearance: She is well-developed.  HENT:     Head: Normocephalic and atraumatic.  Eyes:     Conjunctiva/sclera: Conjunctivae normal.  Neck:     Musculoskeletal: Normal range of  motion and neck supple.     Thyroid: No thyromegaly.     Vascular: No carotid bruit or JVD.  Cardiovascular:     Rate and Rhythm: Normal rate and regular rhythm.     Heart sounds: Normal heart sounds. No murmur.  Pulmonary:     Effort: Pulmonary effort is normal. No respiratory distress.     Breath sounds: Normal breath sounds. No wheezing or rales.  Chest:     Chest wall: No tenderness.  Musculoskeletal:        General: Tenderness present.     Right elbow: She exhibits swelling and effusion.       Arms:  Skin:    Findings: Erythema present.  Neurological:     Mental Status: She is alert and oriented to person, place, and time.    BP (!) 128/50 (BP Location: Left Arm, Patient Position: Sitting, Cuff Size: Large)    Pulse 76    Temp 98.8 F (37.1 C) (Oral)    Resp 18    Ht 5' 2"  (1.575 m)    SpO2 100%    BMI 35.36 kg/m  Wt Readings from Last 3 Encounters:  04/21/19 193 lb 5.5 oz (87.7 kg)  04/14/19 170 lb (77.1 kg)  11/26/18 182 lb (82.6 kg)     Lab Results  Component Value Date   WBC 6.8  05/01/2019   HGB 9.3 (L) 05/01/2019   HCT 29.4 (L) 05/01/2019   PLT 210 05/01/2019   GLUCOSE 82 05/01/2019   CHOL 175 11/26/2018   TRIG 168.0 (H) 11/26/2018   HDL 33.00 (L) 11/26/2018   LDLDIRECT 115.8 01/23/2012   LDLCALC 108 (H) 11/26/2018   ALT 15 05/01/2019   AST 21 05/01/2019   NA 139 05/01/2019   K 4.2 05/01/2019   CL 105 05/01/2019   CREATININE 1.55 (H) 05/01/2019   BUN 19 05/01/2019   CO2 25 05/01/2019   TSH 1.65 01/03/2016   INR 1.1 04/19/2019   HGBA1C 12.2 (H) 04/17/2019   MICROALBUR 26.3 (H) 11/26/2018    US Renal  Result Date: 04/16/2019 CLINICAL DATA:  Acute renal insufficiency. EXAM: RENAL / URINARY TRACT ULTRASOUND COMPLETE COMPARISON:  CT scan January 12, 2017 FINDINGS: Right Kidney: Renal measurements: 11.1 x 5.4 x 5.2 cm = volume: 164 mL . Echogenicity within normal limits. No mass or hydronephrosis visualized. Left Kidney: Renal measurements: 10.4 x 4.9 x 5.1 cm = volume: 135 mL. Echogenicity within normal limits. No mass or hydronephrosis visualized. Bladder: Appears normal for degree of bladder distention. IMPRESSION: Normal study. Electronically Signed   By: Dorise Bullion III M.D   On: 04/16/2019 08:27   Dg Chest Port 1 View  Result Date: 04/16/2019 CLINICAL DATA:  Shortness of breath.  Hypoxia. EXAM: PORTABLE CHEST 1 VIEW COMPARISON:  April 15, 2019 FINDINGS: The heart size and mediastinal contours are within normal limits. Both lungs are clear. The visualized skeletal structures are unremarkable. IMPRESSION: No active disease. Electronically Signed   By: Dorise Bullion III M.D   On: 04/16/2019 15:52   Dg Chest Port 1 View  Result Date: 04/15/2019 CLINICAL DATA:  Chest pain.  Hypoxia. EXAM: PORTABLE CHEST 1 VIEW COMPARISON:  Chest CT dated 12/03/2018 and chest x-ray dated 11/26/2018 FINDINGS: Heart size and pulmonary vascularity are normal. Lungs are clear. Prominent left pericardial fat pad as demonstrated on the prior CT scan. Old healed fracture of the left  clavicle. No acute bone abnormality. IMPRESSION: No acute abnormalities. Electronically Signed   By: Jeneen Rinks  Maxwell M.D.   On: 04/15/2019 12:25   Ct Extrem Up Entire Arm R Wo/cm  Result Date: 04/15/2019 CLINICAL DATA:  [place] 3 days ago.  Persistent arm pain. EXAM: CT OF THE UPPER RIGHT EXTREMITY WITHOUT CONTRAST TECHNIQUE: Multidetector CT imaging of the upper right extremity was performed according to the standard protocol. COMPARISON:  Radiographs 04/14/2019 FINDINGS: The shoulder joint is maintained. No acute fracture of the humeral head or neck is identified. The humeral shaft is intact. No fracture. Severe degenerative changes are noted at the elbow joint with a remote ununited displaced fracture through the radial neck. No definite acute elbow fracture. Significant breathing motion artifact is noted but I do not see any significant pulmonary abnormality other than patchy areas of atelectasis. The visualized ribs appear intact. No obvious rib fractures. IMPRESSION: 1. No acute fracture of the humerus is identified. 2. 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 Associated posttraumatic degenerative changes at the joint. Electronically Signed   By: Marijo Sanes M.D.   On: 04/15/2019 14:55     Assessment & Plan:  Plan  I am having Lourdez R. Mcbrien start on fluticasone. I am also having her maintain her polyethylene glycol, atorvastatin, pregabalin, Insulin Lispro, Insulin Glargine, DULoxetine, ondansetron, cyclobenzaprine, Iron, docusate sodium, CALCIUM ALGINATE EX, Multivitamin Gummies Womens, hydrochlorothiazide, Ensure Max Protein, feeding supplement (NEPRO CARB STEADY), feeding supplement (PRO-STAT SUGAR FREE 64), guaiFENesin, aspirin, nystatin, doxycycline, and HYDROcodone-acetaminophen.  Meds ordered this encounter  Medications   DISCONTD: HYDROcodone-acetaminophen (NORCO/VICODIN) 5-325 MG tablet    Sig: Take 1 tablet by mouth every 6  (six) hours as needed for moderate pain.    Dispense:  120 tablet    Refill:  0   fluticasone (FLONASE) 50 MCG/ACT nasal spray    Sig: Place 2 sprays into both nostrils daily.    Dispense:  16 g    Refill:  6   HYDROcodone-acetaminophen (NORCO/VICODIN) 5-325 MG tablet    Sig: Take 1 tablet by mouth every 6 (six) hours as needed for moderate pain.    Dispense:  120 tablet    Refill:  0    Problem List Items Addressed This Visit      Unprioritized   Septic arthritis of elbow, right (HCC)   Relevant Medications   HYDROcodone-acetaminophen (NORCO/VICODIN) 5-325 MG tablet   Other Relevant Orders   CBC with Differential/Platelet (Completed)   Comprehensive metabolic panel (Completed)   Magnesium (Completed)   Phosphorus (Completed)    Other Visit Diagnoses    Closed fracture of right elbow, initial encounter    -  Primary   Relevant Medications   HYDROcodone-acetaminophen (NORCO/VICODIN) 5-325 MG tablet   Other Relevant Orders   Ambulatory referral to Orthopedic Surgery   Fibromyalgia       Relevant Medications   HYDROcodone-acetaminophen (NORCO/VICODIN) 5-325 MG tablet   Left foot pain       Relevant Medications   HYDROcodone-acetaminophen (NORCO/VICODIN) 5-325 MG tablet   Acute renal failure superimposed on chronic kidney disease, unspecified CKD stage, unspecified acute renal failure type (HCC)       Relevant Orders   CBC with Differential/Platelet (Completed)   Comprehensive metabolic panel (Completed)   Magnesium (Completed)   Phosphorus (Completed)   Seasonal allergies       Relevant Medications   fluticasone (FLONASE) 50 MCG/ACT nasal spray      Follow-up: Return in about 3 months (around 08/01/2019), or if symptoms worsen or fail to improve.  Kendrick Fries  [place], DO

## 2019-05-01 NOTE — Patient Instructions (Signed)
Septic Arthritis Septic arthritis is inflammation of a joint that results from an infection. The infection occurs when bacteria or other germs get inside a joint. The knee and hip joints are most often affected, but other joints may also become infected. Usually, just one joint is affected. Joint infections need to be treated quickly to prevent damage to the joint, and to prevent the infection from spreading to other areas of your body. What are the causes? This condition is most often caused by Staphylococcus bacteria. Other causes may include:  Fungal infections.  Sexually transmitted infections (STIs).  Tuberculosis. Bacteria or other germs can spread to the joint. These bacteria are usually from:  Blood carrying germs from an infection in another part of your body to your joint. This is the most common cause of septic arthritis.  An open wound near the joint.  A needle put into the joint.  Joint surgery.  An infection in the bone (osteomyelitis) that spreads to the joint. What increases the risk? You may have a higher risk for this condition if you:  Have an artificial joint.  Have a blood or skin infection.  Have open sores or wounds on your skin.  Had a recent joint surgery or procedure.  Had a recent joint injury.  Have a long-term (chronic) disease, such as: ? Diabetes. ? Osteoarthritis. ? Rheumatoid arthritis. ? HIV (human immunodeficiency virus).  Have a condition or take medicines that weaken your body's defense system (immune system).  Use IV medicines.  Have gonorrhea.  Have a central line for IV access. What are the signs or symptoms? Symptoms of this condition include:  Swelling at the joint.  Severe pain in the joint.  Redness and warmth in the joint.  Being unable to move the joint.  Fever and chills. How is this diagnosed? This condition may be diagnosed based on:  Your symptoms.  Your medical history.  A physical exam.  Other  tests to confirm the diagnosis. These may include: ? Removing fluid from your joint to look for signs of infection (synovial fluid analysis). ? Blood tests.  Imaging studies. These may include: ? X-rays. ? MRI. ? CT scan. ? Ultrasound. How is this treated? This condition may be treated by:  Draining fluid from your joint. This may be done for several days in order to relieve pain.  Taking antibiotic medicine. This may be given by IV or by mouth. It may be done in a hospital at first. You may have to continue antibiotics at home by IV or by mouth for several weeks after that.  Surgery to remove: ? Infected fluid and tissue from the joint. ? An infected artificial joint. After the infection has started to heal, you may need physical therapy to regain strength and motion of the joint. Follow these instructions at home: Medicines   Take over-the-counter and prescription medicines only as told by your health care provider.  If you were prescribed an antibiotic medicine, take it as told by your health care provider. Do not stop taking the antibiotic even if you start to feel better.  Follow instructions from your health care provider about how to take antibiotics at home by IV. You may need to have a nurse come to your home to give you antibiotics through IV. Managing pain, stiffness, and swelling   If directed, put ice on the affected area: ? Put ice in a plastic bag. ? Place a towel between your skin and the bag. ? Leave the  ice on for 20 minutes, 2-3 times a day.  Raise (elevate) the affected area above the level of your heart while you are sitting or lying down. Activity  Return to your normal activities as told by your health care provider. Ask your health care provider what activities are safe for you.  Do any exercises or stretches as told by your health care provider or physical therapist. General instructions  Do not use any products that contain nicotine or tobacco,  such as cigarettes and e-cigarettes. These can delay healing. If you need help quitting, ask your health care provider.  Wash your hands often with soap and water. If soap and water are not available, use hand sanitizer.  Keep all follow-up visits as told by your health care provider. This is important. Contact a health care provider if you:  Have pain that is not controlled with medicine.  Develop a fever or chills.  Have redness, warmth, pain, or swelling that returns after treatment. Get help right away if you:  Have signs of worsening infection in your joint. Watch for: ? Very severe pain. ? Redness. ? Warmth. ? Swelling.  Have rapid breathing or you have trouble breathing.  Have chest pain.  Cannot drink fluids or make urine.  Notice that the affected area changed color or turned blue.  Have numbness or severe pain in the affected area. Summary  Septic arthritis is inflammation of a joint that occurs when bacteria or other germs get inside a joint and cause an infection.  Joint infections need to be treated quickly to prevent damage to the joint, and to prevent the infection from spreading to other areas of your body.  Symptoms of joint infection include redness, warmth, swelling, pain, and being unable to move a joint.  Treatment usually involves draining fluid from the joint and taking antibiotic medicine. This information is not intended to replace advice given to you by your health care provider. Make sure you discuss any questions you have with your health care provider. Document Released: 01/06/2003 Document Revised: 02/07/2019 Document Reviewed: 11/27/2017 Elsevier Patient Education  2020 [employer].

## 2019-05-02 LAB — CBC WITH DIFFERENTIAL/PLATELET
Absolute Monocytes: 592 cells/uL (ref 200–950)
Basophils Absolute: 68 cells/uL (ref 0–200)
Basophils Relative: 1 %
Eosinophils Absolute: 109 cells/uL (ref 15–500)
Eosinophils Relative: 1.6 %
HCT: 29.4 % — ABNORMAL LOW (ref 35.0–45.0)
Hemoglobin: 9.3 g/dL — ABNORMAL LOW (ref 11.7–15.5)
Lymphs Abs: 1850 cells/uL (ref 850–3900)
MCH: 27 pg (ref 27.0–33.0)
MCHC: 31.6 g/dL — ABNORMAL LOW (ref 32.0–36.0)
MCV: 85.5 fL (ref 80.0–100.0)
MPV: 10.8 fL (ref 7.5–12.5)
Monocytes Relative: 8.7 %
Neutro Abs: 4182 cells/uL (ref 1500–7800)
Neutrophils Relative %: 61.5 %
Platelets: 210 10*3/uL (ref 140–400)
RBC: 3.44 10*6/uL — ABNORMAL LOW (ref 3.80–5.10)
RDW: 14.3 % (ref 11.0–15.0)
Total Lymphocyte: 27.2 %
WBC: 6.8 10*3/uL (ref 3.8–10.8)

## 2019-05-02 LAB — COMPREHENSIVE METABOLIC PANEL
AG Ratio: 0.7 (calc) — ABNORMAL LOW (ref 1.0–2.5)
ALT: 15 U/L (ref 6–29)
AST: 21 U/L (ref 10–35)
Albumin: 3 g/dL — ABNORMAL LOW (ref 3.6–5.1)
Alkaline phosphatase (APISO): 94 U/L (ref 37–153)
BUN/Creatinine Ratio: 12 (calc) (ref 6–22)
BUN: 19 mg/dL (ref 7–25)
CO2: 25 mmol/L (ref 20–32)
Calcium: 8.5 mg/dL — ABNORMAL LOW (ref 8.6–10.4)
Chloride: 105 mmol/L (ref 98–110)
Creat: 1.55 mg/dL — ABNORMAL HIGH (ref 0.50–0.99)
Globulin: 4.1 g/dL (calc) — ABNORMAL HIGH (ref 1.9–3.7)
Glucose, Bld: 82 mg/dL (ref 65–99)
Potassium: 4.2 mmol/L (ref 3.5–5.3)
Sodium: 139 mmol/L (ref 135–146)
Total Bilirubin: 0.4 mg/dL (ref 0.2–1.2)
Total Protein: 7.1 g/dL (ref 6.1–8.1)

## 2019-05-02 LAB — PHOSPHORUS: Phosphorus: 3.8 mg/dL (ref 2.5–4.5)

## 2019-05-02 LAB — MAGNESIUM: Magnesium: 1.8 mg/dL (ref 1.5–2.5)

## 2019-05-04 ENCOUNTER — Other Ambulatory Visit: Payer: Self-pay | Admitting: Family Medicine

## 2019-05-04 DIAGNOSIS — D649 Anemia, unspecified: Secondary | ICD-10-CM

## 2019-05-05 ENCOUNTER — Encounter (HOSPITAL_BASED_OUTPATIENT_CLINIC_OR_DEPARTMENT_OTHER): Payer: PRIVATE HEALTH INSURANCE | Attending: Internal Medicine

## 2019-05-05 ENCOUNTER — Telehealth (HOSPITAL_COMMUNITY): Payer: Self-pay

## 2019-05-05 DIAGNOSIS — I252 Old myocardial infarction: Secondary | ICD-10-CM | POA: Insufficient documentation

## 2019-05-05 DIAGNOSIS — I2510 Atherosclerotic heart disease of native coronary artery without angina pectoris: Secondary | ICD-10-CM | POA: Insufficient documentation

## 2019-05-05 DIAGNOSIS — I10 Essential (primary) hypertension: Secondary | ICD-10-CM | POA: Insufficient documentation

## 2019-05-05 DIAGNOSIS — E1140 Type 2 diabetes mellitus with diabetic neuropathy, unspecified: Secondary | ICD-10-CM | POA: Insufficient documentation

## 2019-05-05 DIAGNOSIS — E11621 Type 2 diabetes mellitus with foot ulcer: Secondary | ICD-10-CM | POA: Insufficient documentation

## 2019-05-05 DIAGNOSIS — L97423 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: Secondary | ICD-10-CM | POA: Insufficient documentation

## 2019-05-05 DIAGNOSIS — L97522 Non-pressure chronic ulcer of other part of left foot with fat layer exposed: Secondary | ICD-10-CM | POA: Insufficient documentation

## 2019-05-05 NOTE — Telephone Encounter (Signed)
Follow up telephone post discharge to assess patient status. No answer. Left a voicemail and contact information for patient to return call.

## 2019-05-13 ENCOUNTER — Encounter: Admit: 2019-05-13 | Discharge: 2019-05-13

## 2019-05-13 DIAGNOSIS — I2510 Atherosclerotic heart disease of native coronary artery without angina pectoris: Secondary | ICD-10-CM | POA: Diagnosis not present

## 2019-05-13 DIAGNOSIS — E11621 Type 2 diabetes mellitus with foot ulcer: Secondary | ICD-10-CM | POA: Diagnosis present

## 2019-05-13 DIAGNOSIS — I10 Essential (primary) hypertension: Secondary | ICD-10-CM | POA: Diagnosis not present

## 2019-05-13 DIAGNOSIS — I252 Old myocardial infarction: Secondary | ICD-10-CM | POA: Diagnosis not present

## 2019-05-13 DIAGNOSIS — L97423 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: Secondary | ICD-10-CM | POA: Diagnosis not present

## 2019-05-13 DIAGNOSIS — L97522 Non-pressure chronic ulcer of other part of left foot with fat layer exposed: Secondary | ICD-10-CM | POA: Diagnosis not present

## 2019-05-13 DIAGNOSIS — E1140 Type 2 diabetes mellitus with diabetic neuropathy, unspecified: Secondary | ICD-10-CM | POA: Diagnosis not present

## 2019-05-13 DIAGNOSIS — J309 Allergic rhinitis, unspecified: Secondary | ICD-10-CM

## 2019-05-13 DIAGNOSIS — J45909 Unspecified asthma, uncomplicated: Secondary | ICD-10-CM

## 2019-05-13 DIAGNOSIS — K2970 Gastritis, unspecified, without bleeding: Secondary | ICD-10-CM

## 2019-05-13 DIAGNOSIS — M79642 Pain in left hand: Secondary | ICD-10-CM

## 2019-05-13 DIAGNOSIS — G9050 Complex regional pain syndrome I, unspecified: Secondary | ICD-10-CM

## 2019-05-13 DIAGNOSIS — E559 Vitamin D deficiency, unspecified: Secondary | ICD-10-CM

## 2019-05-13 DIAGNOSIS — Z5321 Procedure and treatment not carried out due to patient leaving prior to being seen by health care provider: Principal | ICD-10-CM

## 2019-05-13 DIAGNOSIS — D509 Iron deficiency anemia, unspecified: Secondary | ICD-10-CM

## 2019-05-13 DIAGNOSIS — D649 Anemia, unspecified: Secondary | ICD-10-CM

## 2019-05-13 DIAGNOSIS — E785 Hyperlipidemia, unspecified: Secondary | ICD-10-CM

## 2019-05-13 DIAGNOSIS — R1084 Generalized abdominal pain: Secondary | ICD-10-CM

## 2019-05-13 DIAGNOSIS — K5900 Constipation, unspecified: Secondary | ICD-10-CM

## 2019-05-13 DIAGNOSIS — G5600 Carpal tunnel syndrome, unspecified upper limb: Secondary | ICD-10-CM

## 2019-05-13 DIAGNOSIS — K589 Irritable bowel syndrome without diarrhea: Secondary | ICD-10-CM

## 2019-05-13 DIAGNOSIS — E119 Type 2 diabetes mellitus without complications: Secondary | ICD-10-CM

## 2019-05-13 DIAGNOSIS — K921 Melena: Secondary | ICD-10-CM

## 2019-05-13 NOTE — Progress Notes
Date of Service: 05/13/2019    Rhonda Fletcher is a 63 y.o. female.  DOB: 10-01-1956  MRN: [id number]     Subjective:             History of Present Illness    Chief Complaint   Patient presents with   ??? Abdominal pain     and black stool since yesterday, and it burns when pt poops     Last seen in April for AWV.    Lab in April showed persistent anemia and low iron - recommended iron infusions.    Went to ER in April with abd pain, w/u negative, treated with Sucralfate and told to stay on Protonix.  I gave her Rx for Levsin, she reported this helped at her last visit.  Had breath test for H. Pylori - negative.    Has long h/o IBS.  Does admit to more stress lately - her auntie died.    She is here with her daughter today.    Having increased abdominal pain, esp across upper abdomen - burning pain.  Taking Pantoprazole and antacid but not helping.  Abdominal pain started worsening 2 months ago, but more severe lately.  Moving bowels but BM's burn at her anus.  Yesterday stool black, and today stool black.  Vomiting - threw up last week.  Eating - once a day, eating very little because pain increases when she eats.       Review of Systems    No fever  No chills    Objective:         ??? albuterol sulfate (PROAIR HFA) 90 mcg/actuation aerosol inhaler Inhale two puffs by mouth into the lungs every 6 hours as needed.   ??? Alcohol Swabs padm Use one a day to clean skin before fingerstick   ??? amLODIPine (NORVASC) 10 mg tablet Take one tablet by mouth daily.   ??? betamethasone valerate (VALISONE) 0.1 % topical ointment Apply  topically to affected area daily.   ??? Blood Glucose Control, Normal soln Use in glucose meter, Accu-check Aviva   ??? blood sugar diagnostic test strip Use one strip as directed twice daily before meals.   ??? Blood-Glucose Meter kit Use as directed   ??? cetirizine (ZYRTEC) 10 mg tablet Take 10 mg by mouth daily.     ??? cholecalciferol(+) (VITAMIN D-3) 2,000 unit tablet Take 1 Tab by mouth daily. ??? cloNIDine (CATAPRESS) 0.1 mg tablet TAKE 1 TABLET THREE TIMES DAILY   ??? cyclobenzaprine (FLEXERIL) 5 mg tablet TAKE 1 TABLET AT BEDTIME   ??? famotidine (PEPCID) 20 mg tablet TAKE 1 TABLET TWICE DAILY   ??? fluticasone propionate (FLONASE) 50 mcg/actuation nasal spray, suspension Shake bottle gently before using.  Indications: Use 2 SPRAYS IN EACH NOSTRIL AS DIRECTED DAILY   ??? hyoscyamine sulfate (LEVSIN) 0.125 mg tablet Take one tablet by mouth twice daily. For chronic pain from irritable bowel   ??? lancets (ACCU-CHEK SOFTCLIX LANCETS) MISC Use one each as directed daily. Diag:E11.29   ??? metFORMIN (GLUCOPHAGE) 1,000 mg tablet TAKE 1 TABLET TWICE DAILY WITH MEALS   ??? pantoprazole DR (PROTONIX) 40 mg tablet TAKE 1 TABLET EVERY DAY   ??? traZODone (DESYREL) 50 mg tablet Take one tablet by mouth at bedtime as needed for Sleep.     Vitals:    05/13/19 1526   BP: (!) 167/102   BP Source: Arm, Right Upper   Patient Position: Sitting   Pulse: 108   SpO2: 100%  Weight: 66.9 kg (147 lb 6.4 oz)   Height: 160.9 cm (63.35)   PainSc: Ten     Body mass index is 25.82 kg/m???.     Physical Exam    Alert, in moderate distress, holding abdomen and rocking back and forth.  CV: Heart exam shows tachycardia, regular rhythm, no murmur, no S3, no S4.  Abdomen: hypoactive bs, soft, tender across upper abdomen and mid abdomen.  No palpable mass.     Assessment and Plan:  Rhonda Fletcher was seen today for abdominal pain.    Diagnoses and all orders for this visit:    Generalized abdominal pain    Iron deficiency anemia, unspecified iron deficiency anemia type    Melena      I am concerned about Rhonda Fletcher's symptoms and have recommended eval in ER.  She wants to go to [facility] - I called and gave report to the ER staff.  Daughter will take her to the ER immediately.    F/u - to be determined by w/u in ER.

## 2019-05-13 NOTE — Telephone Encounter
Pt called - is sitting at the ER but has not been seen yet.  She states that there are 7 more people in front of her and wants me to call in some pain pills and just go home.  Encouraged her to stay and be seen - I don't feel it's safe for her to go home with a possible GI bleed and with the amount of pain she is in.  Discouraged her from going to another ER - she will likely have to wait there as well.  After discussion, she agrees to wait and be seen in the [facility].

## 2019-05-13 NOTE — Telephone Encounter
Pt called she has been having some abd cramping and discomfort "can feel my stool passing through colon". She says last couple days her stools have been black. She does take iron but just a couple days a week. She is really concerned and would like to be worked in today. appt made for same day with dr lehr

## 2019-05-14 ENCOUNTER — Emergency Department: Admit: 2019-05-13 | Discharge: 2019-05-14 | Discharge status: Against Medical Advice

## 2019-05-14 ENCOUNTER — Encounter: Admit: 2019-05-14 | Discharge: 2019-05-14

## 2019-05-14 ENCOUNTER — Ambulatory Visit: Admit: 2019-05-13 | Discharge: 2019-05-13

## 2019-05-14 DIAGNOSIS — K921 Melena: Secondary | ICD-10-CM

## 2019-05-14 DIAGNOSIS — D509 Iron deficiency anemia, unspecified: Secondary | ICD-10-CM

## 2019-05-14 LAB — CBC AND DIFF
Lab: 1 % (ref 0–2)
Lab: 10 10*3/uL (ref 4.5–11.0)
Lab: 2.4 K/UL (ref 1.0–4.8)
Lab: 33 g/dL — ABNORMAL HIGH (ref 32.0–36.0)
Lab: 79 FL — ABNORMAL LOW (ref 80–100)

## 2019-05-14 LAB — COMPREHENSIVE METABOLIC PANEL
Lab: 0.7 mg/dL (ref 0.3–1.2)
Lab: 10 mg/dL — ABNORMAL HIGH (ref 8.5–10.6)
Lab: 105 MMOL/L (ref 98–110)
Lab: 139 MMOL/L (ref 137–147)
Lab: 16 MMOL/L — ABNORMAL LOW (ref 21–30)
Lab: 18 K/UL — ABNORMAL HIGH (ref 3–12)
Lab: 24 mg/dL — ABNORMAL HIGH (ref 7–25)
Lab: 34 mL/min — ABNORMAL LOW (ref >60)
Lab: 41 mL/min — ABNORMAL LOW (ref >60)
Lab: 5.2 g/dL — ABNORMAL HIGH (ref 3.5–5.0)
Lab: 8.5 g/dL — ABNORMAL HIGH (ref 6.0–8.0)
Lab: 95 U/L (ref 25–110)

## 2019-05-14 LAB — POC TROPONIN: Lab: 0 ng/mL (ref 0.00–0.05)

## 2019-05-14 LAB — POC LACTATE: Lab: 1 MMOL/L (ref 0.5–2.0)

## 2019-05-14 LAB — PROTIME INR (PT): Lab: 1.1 M/UL (ref 0.8–1.2)

## 2019-05-14 LAB — PTT (APTT): Lab: 31 s (ref 24.0–36.5)

## 2019-05-14 LAB — MAGNESIUM: Lab: 1.9 mg/dL — ABNORMAL HIGH (ref 1.6–2.6)

## 2019-05-14 NOTE — ED Notes
Patient to triage desk - states "I'm going home".  Patient ambulated back to waiting room with steady gait.

## 2019-05-14 NOTE — Telephone Encounter
ED Discharge Follow Up  Reached patient: No  Patient contacted PCP office today to notify she is admitted to [facility] at midnight last nigh.   Admission Information  Hospital Name : [facility]  ED Admission Date: 05/13/2019 ED Discharge Date: Left without being seen Admission Diagnosis: Pain  Discharge Diagnosis Left without being seen for Abdominal pain  Hospital Services: Unplanned  Today's call is 1 (calendar) days post discharge  ??? albuterol sulfate (PROAIR HFA) 90 mcg/actuation aerosol inhaler Inhale two puffs by mouth into the lungs every 6 hours as needed.   ??? Alcohol Swabs padm Use one a day to clean skin before fingerstick   ??? amLODIPine (NORVASC) 10 mg tablet Take one tablet by mouth daily.   ??? betamethasone valerate (VALISONE) 0.1 % topical ointment Apply  topically to affected area daily.   ??? Blood Glucose Control, Normal soln Use in glucose meter, Accu-check Aviva   ??? blood sugar diagnostic test strip Use one strip as directed twice daily before meals.   ??? Blood-Glucose Meter kit Use as directed   ??? cetirizine (ZYRTEC) 10 mg tablet Take 10 mg by mouth daily.     ??? cholecalciferol(+) (VITAMIN D-3) 2,000 unit tablet Take 1 Tab by mouth daily.   ??? cloNIDine (CATAPRESS) 0.1 mg tablet TAKE 1 TABLET THREE TIMES DAILY   ??? cyclobenzaprine (FLEXERIL) 5 mg tablet TAKE 1 TABLET AT BEDTIME   ??? famotidine (PEPCID) 20 mg tablet TAKE 1 TABLET TWICE DAILY   ??? fluticasone propionate (FLONASE) 50 mcg/actuation nasal spray, suspension Shake bottle gently before using.  Indications: Use 2 SPRAYS IN EACH NOSTRIL AS DIRECTED DAILY   ??? hyoscyamine sulfate (LEVSIN) 0.125 mg tablet Take one tablet by mouth twice daily. For chronic pain from irritable bowel   ??? lancets (ACCU-CHEK SOFTCLIX LANCETS) MISC Use one each as directed daily. Diag:E11.29   ??? metFORMIN (GLUCOPHAGE) 1,000 mg tablet TAKE 1 TABLET TWICE DAILY WITH MEALS   ??? pantoprazole DR (PROTONIX) 40 mg tablet TAKE 1 TABLET EVERY DAY ??? traZODone (DESYREL) 50 mg tablet Take one tablet by mouth at bedtime as needed for Sleep.       Scheduling Follow-up Appointment  Upcoming appointment date and time and with whom scheduled:   Future Appointments   Date Time Provider Department Center   08/21/2019 10:45 AM Shela Leff, MD [facility]     When was patient???s last PCP visit: 05/13/2019  PCP primary location: [facility]  PCP appointment scheduled? No, No patient contact .  Specialist appointment scheduled? No  Both PCP and Specialist appointment scheduled: No  Is assistance with transportation needed? No      Ewing Schlein, RN

## 2019-05-14 NOTE — ED Notes
Patient ambulated through triage stating "I'm leaving".

## 2019-05-14 NOTE — Telephone Encounter
FYI call pt wanted to let pcp know that she was admitted to [facility] at midnight  No ter details left on msg

## 2019-05-15 ENCOUNTER — Telehealth: Payer: Self-pay | Admitting: *Deleted

## 2019-05-15 ENCOUNTER — Other Ambulatory Visit: Payer: Self-pay | Admitting: Internal Medicine

## 2019-05-15 DIAGNOSIS — M00021 Staphylococcal arthritis, right elbow: Secondary | ICD-10-CM

## 2019-05-15 MED ORDER — DOXYCYCLINE HYCLATE 100 MG PO TABS: 100.0000 mg | ORAL_TABLET | Freq: Two times a day (BID) | ORAL | 0 refills | Status: DC

## 2019-05-15 NOTE — Telephone Encounter (Signed)
Patient called to report that for the past 2 days she has a blister on her arm near the surgery site. She advised it is discolored (white and red), warm to the touch and has sharp shooting pains but no odor or drainage. She has had some increased nausea as well. She takes her last doxy today and is afraid to end up back in the hospital alone.  Spoke with Dr Megan Salon and was advised to bring patient in for office visit Monday 05/19/19 and he will refill her Doxy in the interim.   Patient aware of plan and agrees.

## 2019-05-16 ENCOUNTER — Encounter: Admit: 2019-05-16 | Discharge: 2019-05-16

## 2019-05-16 DIAGNOSIS — K580 Irritable bowel syndrome with diarrhea: Secondary | ICD-10-CM

## 2019-05-16 DIAGNOSIS — R1010 Upper abdominal pain, unspecified: Secondary | ICD-10-CM

## 2019-05-19 ENCOUNTER — Ambulatory Visit (INDEPENDENT_AMBULATORY_CARE_PROVIDER_SITE_OTHER): Payer: PRIVATE HEALTH INSURANCE | Admitting: Internal Medicine

## 2019-05-19 ENCOUNTER — Other Ambulatory Visit: Payer: Self-pay

## 2019-05-19 ENCOUNTER — Encounter: Payer: Self-pay | Admitting: Internal Medicine

## 2019-05-19 DIAGNOSIS — M00021 Staphylococcal arthritis, right elbow: Secondary | ICD-10-CM

## 2019-05-19 MED ORDER — CLINDAMYCIN HCL 300 MG PO CAPS: 300.0000 mg | ORAL_CAPSULE | Freq: Three times a day (TID) | ORAL | 0 refills | Status: DC

## 2019-05-19 NOTE — Progress Notes (Addendum)
[facility]  Patient Active Problem List   Diagnosis Date Noted  . Septic arthritis of elbow, right ([place]) 04/17/2019    Priority: High  . Pressure injury of skin 04/17/2019  . 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 Acute on chronic renal insufficiency 04/15/2019  . Sleep apnea 11/16/2017  . Diabetic foot infection ([place]) 04/06/2017  . Type 2 diabetes mellitus with hyperglycemia, with long-term current use of insulin ([place]) 04/06/2017  . Ulcer of left heel, limited to breakdown of skin ([place]) 02/01/2017  . 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 Neuropathy 05/05/2015  . Diabetic peripheral neuropathy ([place]) 01/12/2015  . Infection associated with orthopedic device ([place]) 12/11/2014  . CKD (chronic kidney disease), stage III ([place]) 05/27/2014  . History of MI (myocardial infarction) 10/06/2013  . Obesity (BMI 30-39.9) 09/20/2013  . 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 Hyponatremia 02/03/2013  . Normocytic anemia 02/03/2013  . CAD (coronary artery disease) 01/11/2012  . UTI 08/05/2010  . Hepatic cirrhosis ([place]) 07/06/2010  . 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  . Hyperlipidemia 06/03/2008  . Essential hypertension 12/23/2006    Patient's Medications  New Prescriptions   CLINDAMYCIN (CLEOCIN) 300 MG CAPSULE    Take 1 capsule (300 mg total) by mouth 3 (three) times daily.  Previous Medications   AMINO ACIDS-PROTEIN HYDROLYS (FEEDING SUPPLEMENT, PRO-STAT SUGAR FREE 64,) LIQD    Take 30 mLs by mouth 2 (two) times daily.   ASPIRIN EC 81 MG EC TABLET    Take 1 tablet (81 mg total) by mouth 2 (two) times daily.   ATORVASTATIN (LIPITOR) 10 MG TABLET    TAKE 1 TABLET BY MOUTH ONCE DAILY   CALCIUM ALGINATE EX    Apply 1 patch topically every other day.   CYCLOBENZAPRINE (FLEXERIL) 10 MG TABLET    Take 1 tablet (10 mg total) by mouth 3 (three) times daily as needed for up to 15 doses for muscle spasms.   DOCUSATE SODIUM (COLACE) 100  MG CAPSULE    Take 100 mg by mouth daily.   DULOXETINE (CYMBALTA) 60 MG CAPSULE    Take 1 capsule by mouth once daily   ENSURE MAX PROTEIN (ENSURE MAX PROTEIN) LIQD    Take 330 mLs (11 oz total) by mouth 2 (two) times daily.   FERROUS SULFATE (IRON) 325 (65 FE) MG TABS    Take 1 tablet by mouth daily.    FLUTICASONE (FLONASE) 50 MCG/ACT NASAL SPRAY    Place 2 sprays into both nostrils daily.   GUAIFENESIN (MUCINEX) 600 MG 12 HR TABLET    Take 1 tablet (600 mg total) by mouth 2 (two) times daily.   HYDROCHLOROTHIAZIDE (HYDRODIURIL) 25 MG TABLET    Take 1 tablet (25 mg total) by mouth daily. RESUME AFTER EVALUATED BY PCP   HYDROCODONE-ACETAMINOPHEN (NORCO/VICODIN) 5-325 MG TABLET    Take 1 tablet by mouth every 6 (six) hours as needed for moderate pain.   INSULIN GLARGINE (LANTUS SOLOSTAR) 100 UNIT/ML SOLOSTAR PEN    INJECT 20 UNITS SUBCUTANEOUSLY ONCE DAILY IN THE MORNING AND THEN INJECT 40 UNITS AT BEDTIME   INSULIN LISPRO (HUMALOG KWIKPEN) 200 UNIT/ML SOPN    Inject 13 Units into the skin daily.   MULTIPLE VITAMINS-MINERALS (MULTIVITAMIN GUMMIES WOMENS) CHEW    Chew 2 each by mouth daily.   NUTRITIONAL SUPPLEMENTS (FEEDING SUPPLEMENT, NEPRO CARB STEADY,) LIQD    Take 237 mLs by mouth 3 (three) times daily  as needed (Supplement).   NYSTATIN (MYCOSTATIN/NYSTOP) POWDER    Apply topically 3 (three) times daily.   ONDANSETRON (ZOFRAN ODT) 4 MG DISINTEGRATING TABLET    Take 1 tablet (4 mg total) by mouth every 8 (eight) hours as needed for nausea or vomiting.   POLYETHYLENE GLYCOL (MIRALAX / GLYCOLAX) PACKET    Take 17 g by mouth daily as needed for mild constipation.    PREGABALIN (LYRICA) 150 MG CAPSULE    TAKE 1 CAPSULE BY MOUTH THREE TIMES DAILY  Modified Medications   No medications on file  Discontinued Medications   DOXYCYCLINE (VIBRA-TABS) 100 MG TABLET    Take 1 tablet (100 mg total) by mouth 2 (two) times daily.    Subjective: Ms. Holton is in for her hospital follow-up visit.  She is a 63  y.o. female with multiple medical problems including poorly controlled diabetes.  In January 2016 she fell and suffered a fracture dislocation of the head of her right radius.  She underwent arthroplasty but developed early postoperative infection due to methicillin sensitive coagulase-negative staph.  She had incision and drainage with hardware removal and placement of an external fixator.  She was treated with IV antibiotics.  Based on notes from [facility] it appears that she had a serious allergic reaction to cefazolin and completed 6 weeks of therapy with clindamycin.  She recently began having fever, chills and increasing right elbow pain leading to admission on 04/15/2019. Her temperature was 100.9 degrees.  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 Associated posttraumatic degenerative changes at the joint".  Arthrocentesis yielded 10 cc of pus.  The specimen clotted so no white blood cell count was performed but there was a predominance of segmented neutrophils.  No crystals were seen.  Gram stain of synovial fluid shows gram-positive cocci and cultures grew MSSA. She underwent incision and drainage.  I elected to treat her with 2 weeks of IV vancomycin and then have her transition to oral doxycycline.  She is now completed 4-1/2 weeks of total antibiotic therapy.  She is worried that her right elbow infection is getting worse.  It is still painful, red and swollen.  She has not had any fever that she is aware of.  She has not had any trouble tolerating her antibiotics.  Review of Systems: Review of Systems  Constitutional: Negative for chills, diaphoresis and fever.  Gastrointestinal: Negative for abdominal pain, diarrhea, nausea and vomiting.  Musculoskeletal: Positive for joint pain.    Past Medical History:  Diagnosis Date  . Acute MI [facility]) 2007   presented to ED & had cardiac cath- but  found to have normal coronaries. Since that point in time her PCP cares f or cardiac needs. Dr. [name] - [facility]  . Anemia   . Anginal pain ([place])   . Anxiety   . Asthma   . Bulging lumbar disc   . Cataract   . 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 Cirrhosis of liver without mention of alcohol   . Constipation   . Dehiscence of closure of skin    left partial calcaneal excision  . Depression   . Diabetes mellitus    insulin dependent, adult onset  . Episode of visual loss of left eye   . Exertional shortness of breath   . Fatty liver   . Fibromyalgia   .  GERD (gastroesophageal reflux disease)   . Hepatic steatosis   . High cholesterol   . Hypertension   . MRSA (methicillin resistant Staphylococcus aureus)   . Neuropathy    lower legs  . Osteoarthritis    hands, hips  . Proximal humerus fracture 10/15/12   Left  . PTSD (post-traumatic stress disorder)   . Renal insufficiency 05/05/2015    Social History   Tobacco Use  . Smoking status: Never Smoker  . Smokeless tobacco: Never Used  Substance Use Topics  . Alcohol use: No    Alcohol/week: 0.0 standard drinks  . Drug use: No    Family History  Problem Relation Age of Onset  . Heart disease Father   . Diabetes Father   . Colitis Father   . Crohn's disease Father   . Cancer Father        leukemia  . Leukemia Father   . Diabetes Mellitus II Brother   . Kidney disease Brother   . Heart disease Brother   . Diabetes Mother   . Hypertension Mother   . Mental illness Mother   . Irritable bowel syndrome Daughter   . Diabetes Mellitus II Brother   . Kidney disease Brother   . Liver disease Brother   . Kidney disease Brother   . Heart attack Brother   . Diabetes Mellitus II Brother   . Heart disease Brother   . Liver disease Brother   . Kidney disease Brother   . Kidney disease Brother   . Diabetes Mellitus II Brother   . Diabetes Mellitus I  Brother     Allergies  Allergen Reactions  . Bee Pollen Anaphylaxis  . Fish-Derived Products Hives, Shortness Of Breath, Swelling and Rash    Hives get in throat causing trouble breathing  . Mushroom Extract Complex Anaphylaxis  . Penicillins Anaphylaxis    Did it involve swelling of the face/tongue/throat, SOB, or low BP? Yes Did it involve sudden or severe rash/hives, skin peeling, or any reaction on the inside of your mouth or nose? No Did you need to seek medical attention at a hospital or doctor's office? Yes When did it last happen?A few months ago If all above answers are "NO", may proceed with cephalosporin use.  [name] Kitchen Rosemary Oil Anaphylaxis  . Shellfish Allergy Hives, Shortness Of Breath, Swelling and Rash  . Tomato Hives and Shortness Of Breath    Hives in throat causes her trouble breathing  . Acetaminophen Other (See Comments)    GI upset  . Acyclovir And Related Other (See Comments)    Unknown rxn  . Aloe Vera Hives  . Broccoli [Brassica Oleracea Italica] Hives  . Naproxen Other (See Comments)    Unknown rxn    Objective: Vitals:   05/19/19 1618  BP: (!) 149/74  Pulse: 77   There is no height or weight on file to calculate BMI.  Physical Exam Constitutional:      Comments: She is much more alert and talkative than when I saw her in the hospital.  She is in good spirits.  She is seated in a wheelchair.  Musculoskeletal:     Comments: Her right lateral elbow incision is healing.  There are no open areas or drainage.  There is erythema, swelling and warmth around the incision.  That area is quite painful with gentle palpation.       Lab Results Sed Rate (mm/hr)  Date Value  04/16/2019 126 (H)  11/26/2018 32 (H)  04/06/2017  83 (H)   CRP (mg/dL)  Date Value  04/16/2019 32.3 (H)  04/06/2017 13.4 (H)  05/05/2015 0.3 (L)     Problem List Items Addressed This Visit      High   Septic arthritis of elbow, right ([place])    I am concerned about  persistent, worsening infection on doxycycline.  She cannot safely receive beta-lactam antibiotics because of her history of anaphylaxis.  She cannot safely receive linezolid because she is currently on duloxetine.  I will recheck lab work today and switch her to oral clindamycin.  I will see her back in 1 week.      Relevant Medications   clindamycin (CLEOCIN) 300 MG capsule   Other Relevant Orders   CBC   Basic metabolic panel   C-reactive protein   Sedimentation rate       Michel Bickers, MD [facility] [phone] pager   [phone] cell 05/19/2019, 5:04 PM

## 2019-05-19 NOTE — Assessment & Plan Note (Addendum)
I am concerned about persistent, worsening infection on doxycycline.  She cannot safely receive beta-lactam antibiotics because of her history of anaphylaxis.  She cannot safely receive linezolid because she is currently on duloxetine.  I will recheck lab work today and switch her to oral clindamycin.  I will see her back in 1 week.

## 2019-05-20 ENCOUNTER — Encounter: Admit: 2019-05-20 | Discharge: 2019-05-20

## 2019-05-20 DIAGNOSIS — E1129 Type 2 diabetes mellitus with other diabetic kidney complication: Secondary | ICD-10-CM

## 2019-05-20 DIAGNOSIS — K580 Irritable bowel syndrome with diarrhea: Secondary | ICD-10-CM

## 2019-05-20 LAB — CBC
HCT: 32.7 % — ABNORMAL LOW (ref 35.0–45.0)
Hemoglobin: 10.2 g/dL — ABNORMAL LOW (ref 11.7–15.5)
MCH: 26.8 pg — ABNORMAL LOW (ref 27.0–33.0)
MCHC: 31.2 g/dL — ABNORMAL LOW (ref 32.0–36.0)
MCV: 86.1 fL (ref 80.0–100.0)
MPV: 12 fL (ref 7.5–12.5)
Platelets: 177 10*3/uL (ref 140–400)
RBC: 3.8 10*6/uL (ref 3.80–5.10)
RDW: 14.4 % (ref 11.0–15.0)
WBC: 6.8 10*3/uL (ref 3.8–10.8)

## 2019-05-20 LAB — BASIC METABOLIC PANEL
BUN/Creatinine Ratio: 16 (calc) (ref 6–22)
BUN: 23 mg/dL (ref 7–25)
CO2: 26 mmol/L (ref 20–32)
Calcium: 8.8 mg/dL (ref 8.6–10.4)
Chloride: 103 mmol/L (ref 98–110)
Creat: 1.46 mg/dL — ABNORMAL HIGH (ref 0.50–0.99)
Glucose, Bld: 270 mg/dL — ABNORMAL HIGH (ref 65–99)
Potassium: 5 mmol/L (ref 3.5–5.3)
Sodium: 135 mmol/L (ref 135–146)

## 2019-05-20 LAB — SEDIMENTATION RATE: Sed Rate: 84 mm/h — ABNORMAL HIGH (ref 0–30)

## 2019-05-20 LAB — C-REACTIVE PROTEIN: CRP: 9.2 mg/L — ABNORMAL HIGH (ref <8.0)

## 2019-05-20 MED ORDER — BLOOD-GLUCOSE METER MISC KIT
PACK | Freq: Every morning | 0 refills | 50.00000 days | Status: AC
Start: 2019-05-20 — End: ?

## 2019-05-20 MED ORDER — BLOOD SUGAR DIAGNOSTIC MISC STRP
1 | ORAL_STRIP | Freq: Every day | 1 refills | 30.00000 days | Status: DC
Start: 2019-05-20 — End: 2019-07-29

## 2019-05-20 MED ORDER — LANCETS MISC MISC
1 | Freq: Every day | 3 refills | Status: AC
Start: 2019-05-20 — End: ?

## 2019-05-20 NOTE — Telephone Encounter
Hospital Discharge Follow Up      Reached Patient:Yes     Admission Information:     Hospital Name: [facility]  Admission Date: 05/14/19  Discharge Date: 05/17/19  Admission Diagnosis: Abdominal pain, black stool  Discharge Diagnosis: Abdominal pain  Has there been a discharge within the last 30 days? No  If yes, reason: [place] [facility] 05/13/19 Abdominal pain  Hospital Services: Unplanned  Today's call is 2(business) days post discharge      Discharge Instruction Review   Did patient receive and understand discharge instructions? Yes    Home Health ordered? No                 Agency name/telephone number:    Has Home Health agency contacted patient? No   Caregiver assistance in the home? Yes   Are there concerns regarding the patient's ADL'S? No  Is patient a fall risk? No    Special diet? consistent carb, heart healthy If yes, type:       Medication Reconciliation    Changes to pre-hospital medications? No  Were new prescriptions filled?No,  due to none ordered  Meds reviewed and reconciled?Yes  ??? albuterol sulfate (PROAIR HFA) 90 mcg/actuation aerosol inhaler Inhale two puffs by mouth into the lungs every 6 hours as needed.   ??? Alcohol Swabs padm Use one a day to clean skin before fingerstick   ??? amLODIPine (NORVASC) 10 mg tablet Take one tablet by mouth daily.   ??? betamethasone valerate (VALISONE) 0.1 % topical ointment Apply  topically to affected area daily.   ??? Blood Glucose Control, Normal soln Use in glucose meter, Accu-check Aviva   ??? blood sugar diagnostic test strip Use one strip as directed twice daily before meals.   ??? Blood-Glucose Meter kit Use as directed   ??? cetirizine (ZYRTEC) 10 mg tablet Take 10 mg by mouth daily.     ??? cholecalciferol(+) (VITAMIN D-3) 2,000 unit tablet Take 1 Tab by mouth daily.   ??? cloNIDine (CATAPRESS) 0.1 mg tablet TAKE 1 TABLET THREE TIMES DAILY   ??? cyclobenzaprine (FLEXERIL) 5 mg tablet TAKE 1 TABLET AT BEDTIME ??? famotidine (PEPCID) 20 mg tablet TAKE 1 TABLET TWICE DAILY   ??? fluticasone propionate (FLONASE) 50 mcg/actuation nasal spray, suspension Shake bottle gently before using.  Indications: Use 2 SPRAYS IN EACH NOSTRIL AS DIRECTED DAILY   ??? hyoscyamine sulfate (LEVSIN) 0.125 mg tablet Take one tablet by mouth twice daily. For chronic pain from irritable bowel   ??? lancets (ACCU-CHEK SOFTCLIX LANCETS) MISC Use one each as directed daily. Diag:E11.29   ??? metFORMIN (GLUCOPHAGE) 1,000 mg tablet TAKE 1 TABLET TWICE DAILY WITH MEALS   ??? pantoprazole DR (PROTONIX) 40 mg tablet TAKE 1 TABLET EVERY DAY   ??? traZODone (DESYREL) 50 mg tablet Take one tablet by mouth at bedtime as needed for Sleep.         Understanding Condition   Having any current symptoms? Yes, Pt denies abdominal pain. She states her stool is no longer black, though contains barium contrast. She denies problem w/bowel/bladder, and is eating without burning, abdominal pain..   Patient understands when to seek additional medical care? Yes   Other instructions provided : Pt is asking for assistance in getting a glucometer.      Scheduling Follow-up Appointment   Upcoming appointment date and time and with whom scheduled:   Future Appointments   Date Time Provider Department Center   05/23/2019  3:00 PM Shela Leff,  MD [facility]   08/21/2019 10:45 AM Shela Leff, MD [facility]     PCP appointment scheduled?Yes, Date: 05/23/19 @3pm  Dr. Johny Shears   PCP primary location: [facility]  Specialist appointment scheduled? No Pt states she has been unable to f/u with [facility] GI, Dr. August Luz due to problem with registration application forwarded to her. [name] will contact the GI office by phone for assist.   Both PCP and Specialist appointment scheduled: No  Is assistance with transportation needed?No      Danae Chen, RN

## 2019-05-21 ENCOUNTER — Encounter: Admit: 2019-05-21 | Discharge: 2019-05-21

## 2019-05-21 MED ORDER — FLUCONAZOLE 150 MG PO TAB
150 mg | ORAL_TABLET | Freq: Every day | ORAL | 0 refills | 3.00000 days | Status: AC
Start: 2019-05-21 — End: ?

## 2019-05-21 NOTE — Telephone Encounter
Pt notified that rx was sent to pharmacy

## 2019-05-21 NOTE — Telephone Encounter
Pt repots that she was d/c from the hospital recently. She was taking lots of antibiotics while she was admitted. She is now c/o vaginal yeast infection and requesting meds to treat

## 2019-05-21 NOTE — Telephone Encounter
I sent a Rx for Diflucan to her pharmacy.

## 2019-05-23 ENCOUNTER — Ambulatory Visit: Admit: 2019-05-23 | Discharge: 2019-05-24

## 2019-05-23 ENCOUNTER — Encounter: Admit: 2019-05-23 | Discharge: 2019-05-23

## 2019-05-23 DIAGNOSIS — K5900 Constipation, unspecified: Secondary | ICD-10-CM

## 2019-05-23 DIAGNOSIS — K580 Irritable bowel syndrome with diarrhea: Secondary | ICD-10-CM

## 2019-05-23 DIAGNOSIS — R1084 Generalized abdominal pain: Secondary | ICD-10-CM

## 2019-05-23 DIAGNOSIS — D649 Anemia, unspecified: Secondary | ICD-10-CM

## 2019-05-23 DIAGNOSIS — E1129 Type 2 diabetes mellitus with other diabetic kidney complication: Secondary | ICD-10-CM

## 2019-05-23 DIAGNOSIS — K219 Gastro-esophageal reflux disease without esophagitis: Secondary | ICD-10-CM

## 2019-05-23 DIAGNOSIS — I10 Essential (primary) hypertension: Secondary | ICD-10-CM

## 2019-05-23 DIAGNOSIS — G5600 Carpal tunnel syndrome, unspecified upper limb: Secondary | ICD-10-CM

## 2019-05-23 DIAGNOSIS — E559 Vitamin D deficiency, unspecified: Secondary | ICD-10-CM

## 2019-05-23 DIAGNOSIS — K589 Irritable bowel syndrome without diarrhea: Secondary | ICD-10-CM

## 2019-05-23 DIAGNOSIS — J309 Allergic rhinitis, unspecified: Secondary | ICD-10-CM

## 2019-05-23 DIAGNOSIS — J45909 Unspecified asthma, uncomplicated: Secondary | ICD-10-CM

## 2019-05-23 DIAGNOSIS — G9050 Complex regional pain syndrome I, unspecified: Secondary | ICD-10-CM

## 2019-05-23 DIAGNOSIS — M79642 Pain in left hand: Secondary | ICD-10-CM

## 2019-05-23 DIAGNOSIS — E785 Hyperlipidemia, unspecified: Secondary | ICD-10-CM

## 2019-05-23 DIAGNOSIS — E119 Type 2 diabetes mellitus without complications: Secondary | ICD-10-CM

## 2019-05-23 DIAGNOSIS — K2970 Gastritis, unspecified, without bleeding: Secondary | ICD-10-CM

## 2019-05-23 NOTE — Progress Notes
Date of Service: 05/23/2019    Rhonda Fletcher is a 63 y.o. female.  DOB: Nov 11, 1955  MRN: [id number]     Subjective:             History of Present Illness    Chief Complaint   Patient presents with   ??? Post-hospital Follow Up     Admission Information:     Hospital Name: [facility]  Admission Date: 05/14/19  Discharge Date: 05/17/19  Admission Diagnosis: Abdominal pain, black stool  Discharge Diagnosis: Abdominal pain  Has there been a discharge within the last 30 days? No  If yes, reason: [place] ED 05/13/19 Abdominal pain  Hospital Services: Unplanned  Today's call is 2(business) days post discharge      Discharge Instruction Review   Did patient receive and understand discharge instructions? Yes    Home Health ordered? No                         Agency name/telephone number:                Has Home Health agency contacted patient? No   Caregiver assistance in the home? Yes   Are there concerns regarding the patient's ADL'S? No  Is patient a fall risk? No    Special diet? consistent carb, heart healthy If yes, type:       Medication Reconciliation    Changes to pre-hospital medications? No  Were new prescriptions filled?No,  due to none ordered  Meds reviewed and reconciled?Yes  ??? albuterol sulfate (PROAIR HFA) 90 mcg/actuation aerosol inhaler Inhale two puffs by mouth into the lungs every 6 hours as needed.   ??? Alcohol Swabs padm Use one a day to clean skin before fingerstick   ??? amLODIPine (NORVASC) 10 mg tablet Take one tablet by mouth daily.   ??? betamethasone valerate (VALISONE) 0.1 % topical ointment Apply  topically to affected area daily.   ??? Blood Glucose Control, Normal soln Use in glucose meter, Accu-check Aviva   ??? blood sugar diagnostic test strip Use one strip as directed twice daily before meals.   ??? Blood-Glucose Meter kit Use as directed   ??? cetirizine (ZYRTEC) 10 mg tablet Take 10 mg by mouth daily.     ??? cholecalciferol(+) (VITAMIN D-3) 2,000 unit tablet Take 1 Tab by mouth daily. ??? cloNIDine (CATAPRESS) 0.1 mg tablet TAKE 1 TABLET THREE TIMES DAILY   ??? cyclobenzaprine (FLEXERIL) 5 mg tablet TAKE 1 TABLET AT BEDTIME   ??? famotidine (PEPCID) 20 mg tablet TAKE 1 TABLET TWICE DAILY   ??? fluticasone propionate (FLONASE) 50 mcg/actuation nasal spray, suspension Shake bottle gently before using.  Indications: Use 2 SPRAYS IN EACH NOSTRIL AS DIRECTED DAILY   ??? hyoscyamine sulfate (LEVSIN) 0.125 mg tablet Take one tablet by mouth twice daily. For chronic pain from irritable bowel   ??? lancets (ACCU-CHEK SOFTCLIX LANCETS) MISC Use one each as directed daily. Diag:E11.29   ??? metFORMIN (GLUCOPHAGE) 1,000 mg tablet TAKE 1 TABLET TWICE DAILY WITH MEALS   ??? pantoprazole DR (PROTONIX) 40 mg tablet TAKE 1 TABLET EVERY DAY   ??? traZODone (DESYREL) 50 mg tablet Take one tablet by mouth at bedtime as needed for Sleep.         Understanding Condition   Having any current symptoms? Yes, Pt denies abdominal pain. She states her stool is no longer black, though contains barium contrast. She denies problem w/bowel/bladder, and is eating without burning,  abdominal pain..   Patient understands when to seek additional medical care? Yes   Other instructions provided : Pt is asking for assistance in getting a glucometer.      Scheduling Follow-up Appointment   Upcoming appointment date and time and with whom scheduled:          Future Appointments   Date Time Provider Department Center   05/23/2019  3:00 PM Shela Leff, MD [facility]   08/21/2019 10:45 AM Shela Leff, MD [facility]      PCP appointment scheduled?Yes, Date: 05/23/19 @3pm  Dr. Johny Shears   PCP primary location: [facility] Medicine  Specialist appointment scheduled? No Pt states she has been unable to f[facility]u with [facility], Dr. August Luz due to problem with registration application forwarded to her. Malayna will contact the GI office by phone for assist.   Both PCP and Specialist appointment scheduled: No Is assistance with transportation needed?No    ---------------------------  Recently hospitalized.  Reviewed TOC phone call, hospital records, and discussed at length with pt.    During hospital stay, had EGD, Colonoscopy, small bowel series - all negative.    Had leg pain, given potassium and magnesium and this improved.    She states that her abd pain resolved when treated in the hospital with antibiotics and Protonix BID.    Sent home on Protonix, but no other med changes made.    She states that Hyoscyamine made her pain worse.  Not taking Carafate.    Is supposed to have a capsule endoscopy test with GI, but has not been able to schedule yet and is trying to get thru to GI to get this scheduled.    She notes that the sudden death of her aunt in 05-04-23 probably contributed to flare of her IBS and was a big part of her pain.    Not having any pain at all now.  Is having regular bowel movements, and no black stools.    She states that she needs an Accuchek glucose monitor.  [facility] mail order pharmacy sent her testing supplies but she also needs a monitor.    Has no questions about medications.  Is able to accomplish ADL's without difficulty.       Review of Systems    No fever  No chills  No CP  No SOB  No nausea  No vomiting  No melena    Objective:         ??? albuterol sulfate (PROAIR HFA) 90 mcg/actuation aerosol inhaler Inhale two puffs by mouth into the lungs every 6 hours as needed.   ??? Alcohol Swabs padm Use one a day to clean skin before fingerstick   ??? amLODIPine (NORVASC) 10 mg tablet Take one tablet by mouth daily.   ??? betamethasone valerate (VALISONE) 0.1 % topical ointment Apply  topically to affected area daily.   ??? Blood Glucose Control, Normal soln Use in glucose meter, Accu-check Aviva   ??? blood sugar diagnostic test strip Use one strip as directed daily before breakfast. DX E11.29   ??? Blood-Glucose Meter kit Use as directed to check blood sugars daily before breakfast DX E11.29 ??? cetirizine (ZYRTEC) 10 mg tablet Take 10 mg by mouth daily.     ??? cholecalciferol(+) (VITAMIN D-3) 2,000 unit tablet Take 1 Tab by mouth daily.   ??? cloNIDine (CATAPRESS) 0.1 mg tablet TAKE 1 TABLET THREE TIMES DAILY   ??? cyclobenzaprine (FLEXERIL) 5 mg tablet TAKE 1 TABLET  AT BEDTIME   ??? famotidine (PEPCID) 20 mg tablet TAKE 1 TABLET TWICE DAILY   ??? fluconazole (DIFLUCAN) 150 mg tablet Take one tablet by mouth daily.   ??? fluticasone propionate (FLONASE) 50 mcg/actuation nasal spray, suspension Shake bottle gently before using.  Indications: Use 2 SPRAYS IN EACH NOSTRIL AS DIRECTED DAILY   ??? lancets MISC Use one each as directed daily before breakfast. Diag: E11.29   ??? metFORMIN (GLUCOPHAGE) 1,000 mg tablet TAKE 1 TABLET TWICE DAILY WITH MEALS   ??? pantoprazole DR (PROTONIX) 40 mg tablet TAKE 1 TABLET EVERY DAY   ??? traZODone (DESYREL) 50 mg tablet Take one tablet by mouth at bedtime as needed for Sleep.     Vitals:    05/23/19 1439 05/23/19 1500   BP: (!) 183/86 (!) 164/82   BP Source: Arm, Right Upper Arm, Right Upper   Patient Position: Sitting Sitting   Pulse: 90 96   SpO2: 96%    Weight: 65.3 kg (144 lb)    Height: 166 cm (65.35)    PainSc: Zero      Body mass index is 23.71 kg/m???.     Physical Exam    Alert, no distress     Assessment and Plan:  Kaedence was seen today for post-hospital follow up.    Diagnoses and all orders for this visit:    Generalized abdominal pain    Type 2 diabetes mellitus with microalbuminuria, without long-term current use of insulin (HCC)  -     ACCUCHEK AVIVA METER (BLOOD GLUCOSE MONITOR)    Irritable bowel syndrome with diarrhea    Gastroesophageal reflux disease, esophagitis presence not specified    Other orders  -     Discontinue: sucralfate (CARAFATE) 1 gram tablet; TAKE 1 TABLET BY MOUTH FOUR TIMES A DAY      Ordered Accuchek monitor from [facility] pharmacy.  Encouraged her to continue to work to get the capsule endoscopy test scheduled.  No changes in medications recommended. F/u in October for f/u diabetes, or sooner if problems.

## 2019-05-24 DIAGNOSIS — R809 Proteinuria, unspecified: Secondary | ICD-10-CM

## 2019-05-27 ENCOUNTER — Other Ambulatory Visit: Payer: Self-pay | Admitting: Family Medicine

## 2019-05-27 DIAGNOSIS — S42401A Unspecified fracture of lower end of right humerus, initial encounter for closed fracture: Secondary | ICD-10-CM

## 2019-05-27 DIAGNOSIS — M797 Fibromyalgia: Secondary | ICD-10-CM

## 2019-05-27 DIAGNOSIS — M79672 Pain in left foot: Secondary | ICD-10-CM

## 2019-05-27 MED ORDER — HYDROCODONE-ACETAMINOPHEN 5-325 MG PO TABS: 1.0000 | ORAL_TABLET | Freq: Four times a day (QID) | ORAL | 0 refills | Status: DC | PRN

## 2019-05-27 NOTE — Telephone Encounter (Signed)
Hydrocodone refill.   Last OV; 05/01/2019 Last Fill; 05/01/2019 #120 and 0RF Pt sig: 1 tab q6h prn UDS: 05/06/2018 Low risk

## 2019-05-27 NOTE — Telephone Encounter (Signed)
Medication Refill - Medication: HYDROcodone-acetaminophen (NORCO/VICODIN) 5-325 MG tablet    Has the patient contacted their pharmacy? Yes.  Pt states she is out of the medication. Please advise.  (Agent: If no, request that the patient contact the pharmacy for the refill.) (Agent: If yes, when and what did the pharmacy advise?)  Preferred Pharmacy (with phone number or street name):  [id number] - [place], [place] - [address]  [address] [place] [address]  Phone: [phone] Fax: [phone]  Not a 24 hour pharmacy; exact hours not known.     Agent: Please be advised that RX refills may take up to 3 business days. We ask that you follow-up with your pharmacy.

## 2019-05-29 ENCOUNTER — Ambulatory Visit (INDEPENDENT_AMBULATORY_CARE_PROVIDER_SITE_OTHER): Payer: PRIVATE HEALTH INSURANCE | Admitting: Internal Medicine

## 2019-05-29 ENCOUNTER — Encounter: Payer: Self-pay | Admitting: Internal Medicine

## 2019-05-29 ENCOUNTER — Other Ambulatory Visit: Payer: Self-pay

## 2019-05-29 DIAGNOSIS — M00021 Staphylococcal arthritis, right elbow: Secondary | ICD-10-CM

## 2019-05-29 NOTE — Assessment & Plan Note (Signed)
She is improving slowly but I am still concerned about possible chronic infection and persistent abscess.  I asked her to schedule a follow-up visit with her orthopedic surgeon, Dr. Malena Catholic.  She will continue clindamycin and follow-up here on 06/10/2019

## 2019-05-29 NOTE — Progress Notes (Signed)
[facility]  Patient Active Problem List   Diagnosis Date Noted  . Septic arthritis of elbow, right ([place]) 04/17/2019    Priority: High  . Pressure injury of skin 04/17/2019  . 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 Acute on chronic renal insufficiency 04/15/2019  . Sleep apnea 11/16/2017  . Diabetic foot infection ([place]) 04/06/2017  . Type 2 diabetes mellitus with hyperglycemia, with long-term current use of insulin ([place]) 04/06/2017  . Ulcer of left heel, limited to breakdown of skin ([place]) 02/01/2017  . 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 Neuropathy 05/05/2015  . Diabetic peripheral neuropathy ([place]) 01/12/2015  . Infection associated with orthopedic device ([place]) 12/11/2014  . CKD (chronic kidney disease), stage III ([place]) 05/27/2014  . History of MI (myocardial infarction) 10/06/2013  . Obesity (BMI 30-39.9) 09/20/2013  . 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 Hyponatremia 02/03/2013  . Normocytic anemia 02/03/2013  . CAD (coronary artery disease) 01/11/2012  . UTI 08/05/2010  . Hepatic cirrhosis ([place]) 07/06/2010  . 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  . Hyperlipidemia 06/03/2008  . Essential hypertension 12/23/2006    Patient's Medications  New Prescriptions   No medications on file  Previous Medications   AMINO ACIDS-PROTEIN HYDROLYS (FEEDING SUPPLEMENT, PRO-STAT SUGAR FREE 64,) LIQD    Take 30 mLs by mouth 2 (two) times daily.   ASPIRIN EC 81 MG EC TABLET    Take 1 tablet (81 mg total) by mouth 2 (two) times daily.   ATORVASTATIN (LIPITOR) 10 MG TABLET    TAKE 1 TABLET BY MOUTH ONCE DAILY   CALCIUM ALGINATE EX    Apply 1 patch topically every other day.   CLINDAMYCIN (CLEOCIN) 300 MG CAPSULE    Take 1 capsule (300 mg total) by mouth 3 (three) times daily.   CYCLOBENZAPRINE (FLEXERIL) 10 MG TABLET    Take 1 tablet (10 mg total) by mouth 3 (three) times daily as needed for up to 15 doses for muscle spasms.   DOCUSATE SODIUM (COLACE) 100 MG CAPSULE    Take 100 mg by mouth daily.   DULOXETINE (CYMBALTA) 60 MG CAPSULE    Take 1 capsule by mouth once daily   ENSURE MAX PROTEIN (ENSURE MAX PROTEIN) LIQD    Take 330 mLs (11 oz total) by mouth 2 (two) times daily.   FERROUS SULFATE (IRON) 325 (65 FE) MG TABS    Take 1 tablet by mouth daily.    FLUTICASONE (FLONASE) 50 MCG/ACT NASAL SPRAY    Place 2 sprays into both nostrils daily.   GUAIFENESIN (MUCINEX) 600 MG 12 HR TABLET    Take 1 tablet (600 mg total) by mouth 2 (two) times daily.   HYDROCHLOROTHIAZIDE (HYDRODIURIL) 25 MG TABLET    Take 1 tablet (25 mg total) by mouth daily. RESUME AFTER EVALUATED BY PCP   HYDROCODONE-ACETAMINOPHEN (NORCO/VICODIN) 5-325 MG TABLET    Take 1 tablet by mouth every 6 (six) hours as needed for moderate pain.   INSULIN GLARGINE (LANTUS SOLOSTAR) 100 UNIT/ML SOLOSTAR PEN    INJECT 20 UNITS SUBCUTANEOUSLY ONCE DAILY IN THE MORNING AND THEN INJECT 40 UNITS AT BEDTIME   INSULIN LISPRO (HUMALOG KWIKPEN) 200 UNIT/ML SOPN    Inject 13 Units into the skin daily.   MULTIPLE VITAMINS-MINERALS (MULTIVITAMIN GUMMIES WOMENS) CHEW    Chew 2 each by mouth daily.   NUTRITIONAL SUPPLEMENTS (FEEDING SUPPLEMENT, NEPRO CARB STEADY,) LIQD    Take 237 mLs by  mouth 3 (three) times daily as needed (Supplement).   NYSTATIN (MYCOSTATIN/NYSTOP) POWDER    Apply topically 3 (three) times daily.   ONDANSETRON (ZOFRAN ODT) 4 MG DISINTEGRATING TABLET    Take 1 tablet (4 mg total) by mouth every 8 (eight) hours as needed for nausea or vomiting.   POLYETHYLENE GLYCOL (MIRALAX / GLYCOLAX) PACKET    Take 17 g by mouth daily as needed for mild constipation.    PREGABALIN (LYRICA) 150 MG CAPSULE    TAKE 1 CAPSULE BY MOUTH THREE TIMES DAILY  Modified Medications   No medications on file  Discontinued Medications   No medications on file    Subjective: [name] [name] is in for her hospital follow-up visit.  She is a 63 y.o. female with multiple medical problems  including poorly controlled diabetes.  In January 2016 she fell and suffered a fracture dislocation of the head of her right radius.  She underwent arthroplasty but developed early postoperative infection due to methicillin sensitive coagulase-negative staph.  She had incision and drainage with hardware removal and placement of an external fixator.  She was treated with IV antibiotics.  Based on notes from [facility] it appears that she had a serious allergic reaction to cefazolin and completed 6 weeks of therapy with clindamycin.  She recently began having fever, chills and increasing right elbow pain leading to admission on 04/15/2019. Her temperature was 100.9 degrees.  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 Associated posttraumatic degenerative changes at the joint".  Arthrocentesis yielded 10 cc of pus.  The specimen clotted so no white blood cell count was performed but there was a predominance of segmented neutrophils.  No crystals were seen.  Gram stain of synovial fluid shows gram-positive cocci and cultures grew MRSA. She underwent incision and drainage.  I elected to treat her with 2 weeks of IV vancomycin and then have her transition to oral doxycycline.  When I saw her 10 days ago she still had quite a bit of redness and swelling.  I changed her to oral clindamycin.  She is tolerating it well.  She is now completed 45 total days of antibiotic therapy.    Review of Systems: Review of Systems  Constitutional: Negative for chills, diaphoresis and fever.  Gastrointestinal: Negative for abdominal pain, diarrhea, nausea and vomiting.  Musculoskeletal: Positive for joint pain.    Past Medical History:  Diagnosis Date  . Acute MI [facility]) 2007   presented to ED & had cardiac cath- but found to have normal coronaries. Since that point in time her PCP cares f or cardiac needs. Dr. [name] - [facility]  . Anemia   . Anginal pain ([place])   . Anxiety   . Asthma   . Bulging lumbar disc   . Cataract   . 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 Cirrhosis of liver without mention of alcohol   . Constipation   . Dehiscence of closure of skin    left partial calcaneal excision  . Depression   . Diabetes mellitus    insulin dependent, adult onset  . Episode of visual loss of left eye   . Exertional shortness of breath   . Fatty liver   . Fibromyalgia   . GERD (gastroesophageal reflux disease)   . Hepatic steatosis   . High cholesterol   . Hypertension   .  MRSA (methicillin resistant Staphylococcus aureus)   . Neuropathy    lower legs  . Osteoarthritis    hands, hips  . Proximal humerus fracture 10/15/12   Left  . PTSD (post-traumatic stress disorder)   . Renal insufficiency 05/05/2015    Social History   Tobacco Use  . Smoking status: Never Smoker  . Smokeless tobacco: Never Used  Substance Use Topics  . Alcohol use: No    Alcohol/week: 0.0 standard drinks  . Drug use: No    Family History  Problem Relation Age of Onset  . Heart disease Father   . Diabetes Father   . Colitis Father   . Crohn's disease Father   . Cancer Father        leukemia  . Leukemia Father   . Diabetes Mellitus II Brother   . Kidney disease Brother   . Heart disease Brother   . Diabetes Mother   . Hypertension Mother   . Mental illness Mother   . Irritable bowel syndrome Daughter   . Diabetes Mellitus II Brother   . Kidney disease Brother   . Liver disease Brother   . Kidney disease Brother   . Heart attack Brother   . Diabetes Mellitus II Brother   . Heart disease Brother   . Liver disease Brother   . Kidney disease Brother   . Kidney disease Brother   . Diabetes Mellitus II Brother   . Diabetes Mellitus I Brother     Allergies  Allergen Reactions  . Bee Pollen Anaphylaxis  . Fish-Derived Products Hives, Shortness Of  Breath, Swelling and Rash    Hives get in throat causing trouble breathing  . Mushroom Extract Complex Anaphylaxis  . Penicillins Anaphylaxis    Did it involve swelling of the face/tongue/throat, SOB, or low BP? Yes Did it involve sudden or severe rash/hives, skin peeling, or any reaction on the inside of your mouth or nose? No Did you need to seek medical attention at a hospital or doctor's office? Yes When did it last happen?A few months ago If all above answers are "NO", may proceed with cephalosporin use.  [name] Kitchen Rosemary Oil Anaphylaxis  . Shellfish Allergy Hives, Shortness Of Breath, Swelling and Rash  . Tomato Hives and Shortness Of Breath    Hives in throat causes her trouble breathing  . Acetaminophen Other (See Comments)    GI upset  . Acyclovir And Related Other (See Comments)    Unknown rxn  . Aloe Vera Hives  . Broccoli [Brassica Oleracea Italica] Hives  . Naproxen Other (See Comments)    Unknown rxn    Objective: Vitals:   05/29/19 1413  BP: (!) 113/50  Pulse: 80  Temp: 98 F (36.7 C)   There is no height or weight on file to calculate BMI.  Physical Exam Constitutional:      Comments: She is pleasant and in good spirits.  Musculoskeletal:     Comments: Her incision is looking a little bit better but she still has erythema and swelling around the incision.  There is a dime sized central area of soft fluctuance.    Photo above taken on 05/19/2019   Above taken today 05/29/2019  Lab Results Sed Rate  Date Value  05/19/2019 84 mm/h (H)  04/16/2019 126 mm/hr (H)  11/26/2018 32 mm/hr (H)   CRP  Date Value  05/19/2019 9.2 mg/L (H)  04/16/2019 32.3 mg/dL (H)  04/06/2017 13.4 mg/dL (H)     Problem List Items  Addressed This Visit      High   Septic arthritis of elbow, right ([place])    She is improving slowly but I am still concerned about possible chronic infection and persistent abscess.  I asked her to schedule a follow-up visit with her orthopedic  surgeon, Dr. Malena Catholic.  She will continue clindamycin and follow-up here on 06/10/2019          [name] Bickers, MD [facility] [employer] [phone] pager   [phone] cell 05/29/2019, 2:31 PM

## 2019-06-03 ENCOUNTER — Encounter (HOSPITAL_BASED_OUTPATIENT_CLINIC_OR_DEPARTMENT_OTHER): Payer: PRIVATE HEALTH INSURANCE | Attending: Internal Medicine

## 2019-06-03 DIAGNOSIS — E11610 Type 2 diabetes mellitus with diabetic neuropathic arthropathy: Secondary | ICD-10-CM | POA: Diagnosis not present

## 2019-06-03 DIAGNOSIS — L97522 Non-pressure chronic ulcer of other part of left foot with fat layer exposed: Secondary | ICD-10-CM | POA: Insufficient documentation

## 2019-06-03 DIAGNOSIS — M21612 Bunion of left foot: Secondary | ICD-10-CM | POA: Insufficient documentation

## 2019-06-03 DIAGNOSIS — E11621 Type 2 diabetes mellitus with foot ulcer: Secondary | ICD-10-CM | POA: Insufficient documentation

## 2019-06-03 DIAGNOSIS — M7732 Calcaneal spur, left foot: Secondary | ICD-10-CM | POA: Diagnosis not present

## 2019-06-03 DIAGNOSIS — L97422 Non-pressure chronic ulcer of left heel and midfoot with fat layer exposed: Secondary | ICD-10-CM | POA: Insufficient documentation

## 2019-06-03 DIAGNOSIS — I2510 Atherosclerotic heart disease of native coronary artery without angina pectoris: Secondary | ICD-10-CM | POA: Insufficient documentation

## 2019-06-03 DIAGNOSIS — I10 Essential (primary) hypertension: Secondary | ICD-10-CM | POA: Insufficient documentation

## 2019-06-03 DIAGNOSIS — I252 Old myocardial infarction: Secondary | ICD-10-CM | POA: Insufficient documentation

## 2019-06-03 DIAGNOSIS — E1140 Type 2 diabetes mellitus with diabetic neuropathy, unspecified: Secondary | ICD-10-CM | POA: Diagnosis not present

## 2019-06-07 ENCOUNTER — Encounter: Admit: 2019-06-07 | Discharge: 2019-06-07

## 2019-06-09 MED ORDER — FAMOTIDINE 20 MG PO TAB
ORAL_TABLET | Freq: Two times a day (BID) | ORAL | 2 refills | 90.00000 days | Status: DC
Start: 2019-06-09 — End: 2020-04-23

## 2019-06-09 MED ORDER — CLONIDINE HCL 0.1 MG PO TAB
ORAL_TABLET | Freq: Three times a day (TID) | 2 refills | Status: DC
Start: 2019-06-09 — End: 2020-01-21

## 2019-06-10 ENCOUNTER — Ambulatory Visit: Payer: PRIVATE HEALTH INSURANCE | Admitting: Internal Medicine

## 2019-06-13 ENCOUNTER — Encounter (HOSPITAL_BASED_OUTPATIENT_CLINIC_OR_DEPARTMENT_OTHER): Payer: PRIVATE HEALTH INSURANCE

## 2019-06-13 DIAGNOSIS — E11621 Type 2 diabetes mellitus with foot ulcer: Secondary | ICD-10-CM | POA: Diagnosis not present

## 2019-06-18 ENCOUNTER — Other Ambulatory Visit: Payer: Self-pay | Admitting: Family Medicine

## 2019-06-20 ENCOUNTER — Other Ambulatory Visit (HOSPITAL_COMMUNITY)
Admission: RE | Admit: 2019-06-20 | Discharge: 2019-06-20 | Disposition: A | Discharge status: Home / Self Care | Payer: PRIVATE HEALTH INSURANCE | Source: Other Acute Inpatient Hospital | Attending: Internal Medicine | Admitting: Internal Medicine

## 2019-06-20 DIAGNOSIS — E11621 Type 2 diabetes mellitus with foot ulcer: Secondary | ICD-10-CM | POA: Diagnosis present

## 2019-06-20 NOTE — Telephone Encounter (Signed)
Requesting: [facility] Contract: 08/16/2017 UDS: 05/06/2018, low risk, next screening 11/06/2018 Last OV: 05/01/2019 Next OV: 09/05/2019 Last Refill: 10/10/2018, #90--0 RF Database:   Please advise

## 2019-06-21 ENCOUNTER — Other Ambulatory Visit: Payer: Self-pay

## 2019-06-21 ENCOUNTER — Ambulatory Visit (HOSPITAL_BASED_OUTPATIENT_CLINIC_OR_DEPARTMENT_OTHER)
Admission: RE | Admit: 2019-06-21 | Discharge: 2019-06-21 | Disposition: A | Discharge status: Home / Self Care | Payer: PRIVATE HEALTH INSURANCE | Source: Ambulatory Visit | Attending: Family Medicine | Admitting: Family Medicine

## 2019-06-21 ENCOUNTER — Other Ambulatory Visit (HOSPITAL_BASED_OUTPATIENT_CLINIC_OR_DEPARTMENT_OTHER): Payer: Self-pay | Admitting: *Deleted

## 2019-06-21 DIAGNOSIS — L97423 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: Secondary | ICD-10-CM

## 2019-06-21 DIAGNOSIS — M7989 Other specified soft tissue disorders: Secondary | ICD-10-CM | POA: Diagnosis not present

## 2019-06-22 LAB — AEROBIC CULTURE W GRAM STAIN (SUPERFICIAL SPECIMEN): Gram Stain: NONE SEEN

## 2019-06-23 ENCOUNTER — Other Ambulatory Visit: Payer: Self-pay | Admitting: Family Medicine

## 2019-06-23 DIAGNOSIS — S42401A Unspecified fracture of lower end of right humerus, initial encounter for closed fracture: Secondary | ICD-10-CM

## 2019-06-23 DIAGNOSIS — M797 Fibromyalgia: Secondary | ICD-10-CM

## 2019-06-23 DIAGNOSIS — M79672 Pain in left foot: Secondary | ICD-10-CM

## 2019-06-23 MED ORDER — HYDROCODONE-ACETAMINOPHEN 5-325 MG PO TABS: 1.0000 | ORAL_TABLET | Freq: Four times a day (QID) | ORAL | 0 refills | Status: DC | PRN

## 2019-06-23 NOTE — Telephone Encounter (Signed)
Requesting: [facility] Contract: 08/16/2017 UDS: 05/06/2018, low risk, next screen 11/06/2018 Last OV: 05/01/2019 Next OV: 09/05/2019 Last Refill: 05/27/2019, #120--0 RF Database:   Please advise

## 2019-06-23 NOTE — Telephone Encounter (Signed)
Medication: HYDROcodone-acetaminophen (NORCO/VICODIN) 5-325 MG tablet    Patient is requesting refill of this medication.    Pharmacy:  [id number] - [place], [place] - [address] AT [place] [phone] (Phone) [phone] (Fax)

## 2019-06-24 ENCOUNTER — Other Ambulatory Visit: Payer: Self-pay | Admitting: Family Medicine

## 2019-06-24 ENCOUNTER — Telehealth: Payer: Self-pay | Admitting: *Deleted

## 2019-06-24 ENCOUNTER — Telehealth: Payer: Self-pay | Admitting: Family Medicine

## 2019-06-24 DIAGNOSIS — M79672 Pain in left foot: Secondary | ICD-10-CM

## 2019-06-24 DIAGNOSIS — S42401A Unspecified fracture of lower end of right humerus, initial encounter for closed fracture: Secondary | ICD-10-CM

## 2019-06-24 DIAGNOSIS — M797 Fibromyalgia: Secondary | ICD-10-CM

## 2019-06-24 MED ORDER — HYDROCODONE-ACETAMINOPHEN 5-325 MG PO TABS: 1.0000 | ORAL_TABLET | Freq: Four times a day (QID) | ORAL | 0 refills | Status: DC | PRN

## 2019-06-24 NOTE — Telephone Encounter (Signed)
Tentatively scheduled patient for 9/9 at 9:00 with Dr Megan Salon.  RN left message for patient to please call back and confirm plan, appointment time/day.

## 2019-06-24 NOTE — Telephone Encounter (Signed)
Can you please send to [facility]?

## 2019-06-24 NOTE — Telephone Encounter (Signed)
done

## 2019-06-24 NOTE — Telephone Encounter (Signed)
Patient missed follow up appointment at [facility] 8/11; she said her husband was in the emergency room for kidney stones then and he is her driver as she is legally blind.    She is calling after seeing her surgeon Dr [name] today.  She finishes her clindamycin 300 mg TID next week but says her elbow is still swollen with "lots of fluid coming out."  She is changing her bandages twice daily due to the amount of drainage. It is serosanguinous ("light red") and she says her hand is ice cold with numbness/shooting pain. Overall, she feels clammy, thinks she has a temperature around 99 but hasn't taken it with a thermometer.  She follows at the wound center weekly for her foot, trying to avoid amputation.  She follows up with Dr [name] on 9/9. Please advise, as this is your next clinic day. Landis Gandy, RN

## 2019-06-24 NOTE — Telephone Encounter (Signed)
Pt needs the refill for HYDROcodone-acetaminophen (NORCO/VICODIN) 5-325 MG tablet  To go to [facility] please resend to the correct pharmacy. Rx was sent to [facility] instead but the cost there is almost $200 and at [facility] it is $25/ please advise when changed   [id number] - [place], [place] - [address] [phone] (Phone) [phone] (Fax)

## 2019-06-24 NOTE — Telephone Encounter (Signed)
Please have her continue clindamycin until she follows up with me on 07/09/2019.  Thanks.

## 2019-06-25 NOTE — Telephone Encounter (Signed)
Patient informed of plan and confirmed appointment. Patient was very appreciative of phone call.  [name] [name]

## 2019-06-25 NOTE — Telephone Encounter (Signed)
Patient stated that the pharmacy did get medication and they are working on it now.

## 2019-06-26 ENCOUNTER — Encounter: Payer: Self-pay | Admitting: Family Medicine

## 2019-06-26 ENCOUNTER — Other Ambulatory Visit: Payer: Self-pay

## 2019-06-26 ENCOUNTER — Ambulatory Visit (INDEPENDENT_AMBULATORY_CARE_PROVIDER_SITE_OTHER): Payer: PRIVATE HEALTH INSURANCE | Admitting: Family Medicine

## 2019-06-26 ENCOUNTER — Encounter: Admit: 2019-06-26 | Discharge: 2019-06-26

## 2019-06-26 VITALS — BP 138/80 | HR 76 | Temp 98.7°F | Resp 18 | Ht 62.0 in | Wt 184.0 lb

## 2019-06-26 DIAGNOSIS — B354 Tinea corporis: Secondary | ICD-10-CM | POA: Diagnosis not present

## 2019-06-26 DIAGNOSIS — Z23 Encounter for immunization: Secondary | ICD-10-CM | POA: Diagnosis not present

## 2019-06-26 DIAGNOSIS — D229 Melanocytic nevi, unspecified: Secondary | ICD-10-CM | POA: Diagnosis not present

## 2019-06-26 MED ORDER — BETAMETHASONE VALERATE 0.1 % TP OINT
Freq: Every day | TOPICAL | 3 refills | 30.00000 days | Status: AC
Start: 2019-06-26 — End: ?

## 2019-06-26 MED ORDER — PANTOPRAZOLE 40 MG PO TBEC
ORAL_TABLET | Freq: Every day | ORAL | 1 refills | 90.00000 days | Status: DC
Start: 2019-06-26 — End: 2019-11-03

## 2019-06-26 MED ORDER — AMLODIPINE 10 MG PO TAB
ORAL_TABLET | Freq: Every day | 3 refills | Status: DC
Start: 2019-06-26 — End: 2020-02-16

## 2019-06-26 MED ORDER — NYSTATIN 100000 UNIT/GM EX POWD: Freq: Three times a day (TID) | CUTANEOUS | 0 refills | Status: DC

## 2019-06-26 NOTE — Patient Instructions (Signed)
Body Ringworm Body ringworm is an infection of the skin that often causes a ring-shaped rash. Body ringworm is also called tinea corporis. Body ringworm can affect any part of your skin. This condition is easily spread from person to person (is very contagious). What are the causes? This condition is caused by fungi called dermatophytes. The condition develops when these fungi grow out of control on the skin. You can get this condition if you touch a person or animal that has it. You can also get it if you share any items with an infected person or pet. These include:  Clothing, bedding, and towels.  Brushes or combs.  Gym equipment.  Any other object that has the fungus on it. What increases the risk? You are more likely to develop this condition if you:  Play sports that involve close physical contact, such as wrestling.  Sweat a lot.  Live in areas that are hot and humid.  Use public showers.  Have a weakened immune system. What are the signs or symptoms? Symptoms of this condition include:  Itchy, raised red spots and bumps.  Red scaly patches.  A ring-shaped rash. The rash may have: ? A clear center. ? Scales or red bumps at its center. ? Redness near its borders. ? Dry and scaly skin on or around it. How is this diagnosed? This condition can usually be diagnosed with a skin exam. A skin scraping may be taken from the affected area and examined under a microscope to see if the fungus is present. How is this treated? This condition may be treated with:  An antifungal cream or ointment.  An antifungal shampoo.  Antifungal medicines. These may be prescribed if your ringworm: ? Is severe. ? Keeps coming back. ? Lasts a long time. Follow these instructions at home:  Take over-the-counter and prescription medicines only as told by your health care provider.  If you were given an antifungal cream or ointment: ? Use it as told by your health care provider. ? Wash  the infected area and dry it completely before applying the cream or ointment.  If you were given an antifungal shampoo: ? Use it as told by your health care provider. ? Leave the shampoo on your body for 3-5 minutes before rinsing.  While you have a rash: ? Wear loose clothing to stop clothes from rubbing and irritating it. ? Wash or change your bed sheets every night. ? Disinfect or throw out items that may be infected. ? Wash clothes and bed sheets in hot water. ? Wash your hands often with soap and water. If soap and water are not available, use hand sanitizer.  If your pet has the same infection, take your pet to see a veterinarian for treatment. How is this prevented?  Take a bath or shower every day and after every time you work out or play sports.  Dry your skin completely after bathing.  Wear sandals or shoes in public places and showers.  Change your clothes every day.  Wash athletic clothes after each use.  Do not share personal items with others.  Avoid touching red patches of skin on other people.  Avoid touching pets that have bald spots.  If you touch an animal that has a bald spot, wash your hands. Contact a health care provider if:  Your rash continues to spread after 7 days of treatment.  Your rash is not gone in 4 weeks.  The area around your rash gets red, warm,  tender, and swollen. Summary  Body ringworm is an infection of the skin that often causes a ring-shaped rash.  This condition is easily spread from person to person (is very contagious).  This condition may be treated with antifungal cream or ointment, antifungal shampoo, or antifungal medicines.  Take over-the-counter and prescription medicines only as told by your health care provider. This information is not intended to replace advice given to you by your health care provider. Make sure you discuss any questions you have with your health care provider. Document Released: 10/13/2000  Document Revised: 06/14/2018 Document Reviewed: 06/14/2018 Elsevier Patient Education  2020 [employer].

## 2019-06-26 NOTE — Progress Notes (Signed)
[name], female    DOB: 1956-07-18  Age: 63 y.o. MRN: [id number]    Subjective:  Subjective  HPI [name] [name] presents for rash under arms and under breasts.    Review of Systems  Constitutional: Positive for fatigue. Negative for activity change, appetite change and unexpected weight change.  Respiratory: Negative for cough and shortness of breath.   Cardiovascular: Negative for chest pain and palpitations.  Skin: Positive for rash.  Psychiatric/Behavioral: Negative for behavioral problems and dysphoric mood. The patient is not nervous/anxious.     History Past Medical History:  Diagnosis Date  . Acute MI [facility]) 2007   presented to ED & had cardiac cath- but found to have normal coronaries. Since that point in time her PCP cares f or cardiac needs. Dr. [name] - [facility]  . Anemia   . Anginal pain ([facility])   . Anxiety   . Asthma   . Bulging lumbar disc   . Cataract   . 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 Cirrhosis of liver without mention of alcohol   . Constipation   . Dehiscence of closure of skin    left partial calcaneal excision  . Depression   . Diabetes mellitus    insulin dependent, adult onset  . Episode of visual loss of left eye   . Exertional shortness of breath   . Fatty liver   . Fibromyalgia   . GERD (gastroesophageal reflux disease)   . Hepatic steatosis   . High cholesterol   . Hypertension   . MRSA (methicillin resistant Staphylococcus aureus)   . Neuropathy    lower legs  . Osteoarthritis    hands, hips  . Proximal humerus fracture 10/15/12   Left  . PTSD (post-traumatic stress disorder)   . 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   Her family history includes Cancer in her father; Colitis in her father; Crohn's disease in her father; Diabetes in her father and mother; Diabetes Mellitus I in her brother; Diabetes Mellitus II in her brother, brother, brother, and brother; Heart attack in her brother; Heart disease in her brother, brother, and father; Hypertension in her mother; Irritable bowel syndrome in her daughter; Kidney disease in her brother, brother, brother, brother, and brother; Leukemia in her father; Liver disease in her brother and brother; Mental illness in her mother.She reports that she has never smoked. She has never used smokeless tobacco. She reports that she does not drink alcohol or use drugs.  Current Outpatient Medications on File Prior to Visit  Medication Sig Dispense Refill  . Amino Acids-Protein Hydrolys (FEEDING SUPPLEMENT, PRO-STAT SUGAR FREE 64,) LIQD Take 30 mLs by mouth 2 (two) times daily. 887 mL 0  . aspirin EC 81 MG EC tablet Take 1 tablet (81 mg total) by mouth 2 (two) times daily. 28 tablet 0  . atorvastatin (  LIPITOR) 10 MG tablet TAKE 1 TABLET BY MOUTH ONCE DAILY (Patient taking differently: Take 10 mg by mouth daily. ) 90 tablet 1  . CALCIUM ALGINATE EX Apply 1 patch topically every other day.    . clindamycin (CLEOCIN) 300 MG capsule Take 1 capsule (300 mg total) by mouth 3 (three) times daily. 90 capsule 0  . cyclobenzaprine (FLEXERIL) 10 MG tablet Take 1 tablet (10 mg total) by mouth 3 (three) times daily as needed for up to 15 doses for muscle spasms. 15  tablet 0  . docusate sodium (COLACE) 100 MG capsule Take 100 mg by mouth daily.    . DULoxetine (CYMBALTA) 60 MG capsule Take 1 capsule by mouth once daily (Patient taking differently: Take 60 mg by mouth daily. ) 90 capsule 1  . Ensure Max Protein (ENSURE MAX PROTEIN) LIQD Take 330 mLs (11 oz total) by mouth 2 (two) times daily. 3300 mL 0  . Ferrous Sulfate (IRON) 325 (65 Fe) MG TABS Take 1 tablet by mouth daily.     . fluticasone (FLONASE) 50 MCG/ACT nasal spray Place 2 sprays into both nostrils daily. 16 g 6  . guaiFENesin (MUCINEX) 600 MG 12 hr tablet Take 1 tablet (600 mg total) by mouth 2 (two) times daily. 10 tablet 0  . hydrochlorothiazide (HYDRODIURIL) 25 MG tablet Take 1 tablet (25 mg total) by mouth daily. RESUME AFTER EVALUATED BY PCP 90 tablet 1  . HYDROcodone-acetaminophen (NORCO/VICODIN) 5-325 MG tablet Take 1 tablet by mouth every 6 (six) hours as needed for moderate pain. 120 tablet 0  . Insulin Glargine (LANTUS SOLOSTAR) 100 UNIT/ML Solostar Pen INJECT 20 UNITS SUBCUTANEOUSLY ONCE DAILY IN THE MORNING AND THEN INJECT 40 UNITS AT BEDTIME (Patient taking differently: Inject 20-40 Units into the skin See admin instructions. Inject 20 units subcutaneously in the morning and 40 units at bedtime) 45 mL 1  . Insulin Lispro (HUMALOG KWIKPEN) 200 UNIT/ML SOPN Inject 13 Units into the skin daily. 7 pen 3  . Multiple Vitamins-Minerals (MULTIVITAMIN GUMMIES WOMENS) CHEW Chew 2 each by mouth daily.    . Nutritional Supplements (FEEDING SUPPLEMENT, NEPRO CARB STEADY,) LIQD Take 237 mLs by mouth 3 (three) times daily as needed (Supplement). 2000 mL 0  . ondansetron (ZOFRAN ODT) 4 MG disintegrating tablet Take 1 tablet (4 mg total) by mouth every 8 (eight) hours as needed for nausea or vomiting. 20 tablet 0  . polyethylene glycol (MIRALAX / GLYCOLAX) packet Take 17 g by mouth daily as needed for mild constipation.     . pregabalin (LYRICA) 150 MG capsule Take 1 capsule (150 mg total) by mouth 2  (two) times daily. 180 capsule 1  . [DISCONTINUED] metFORMIN (GLUCOPHAGE) 1000 MG tablet Take 1,000 mg by mouth 2 (two) times daily with a meal.      . [DISCONTINUED] omeprazole (PRILOSEC) 20 MG capsule Take 20 mg by mouth daily.       No current facility-administered medications on file prior to visit.      Objective:  Objective  Physical Exam Constitutional:      Appearance: She is well-developed.  HENT:     Head: Normocephalic and atraumatic.  Eyes:     Conjunctiva/sclera: Conjunctivae normal.  Neck:     Musculoskeletal: Normal range of motion and neck supple.     Thyroid: No thyromegaly.     Vascular: No carotid bruit or JVD.  Cardiovascular:     Rate and Rhythm: Normal rate and regular rhythm.     Heart  sounds: Normal heart sounds. No murmur.  Pulmonary:     Effort: Pulmonary effort is normal. No respiratory distress.     Breath sounds: Normal breath sounds. No wheezing or rales.  Chest:     Chest wall: No tenderness.     Breasts:        Left: Skin change present.    Skin:    Findings: Erythema and rash present.       Neurological:     Mental Status: She is alert and oriented to person, place, and time.    BP 138/80 (BP Location: Right Arm, Patient Position: Sitting, Cuff Size: Normal)   Pulse 76   Temp 98.7 F (37.1 C) (Temporal)   Resp 18   Ht 5' 2"  (1.575 m)   Wt 184 lb (83.5 kg)   SpO2 96%   BMI 33.65 kg/m  Wt Readings from Last 3 Encounters:  06/26/19 184 lb (83.5 kg)  04/21/19 193 lb 5.5 oz (87.7 kg)  04/14/19 170 lb (77.1 kg)     Lab Results  Component Value Date   WBC 6.8 05/19/2019   HGB 10.2 (L) 05/19/2019   HCT 32.7 (L) 05/19/2019   PLT 177 05/19/2019   GLUCOSE 270 (H) 05/19/2019   CHOL 175 11/26/2018   TRIG 168.0 (H) 11/26/2018   HDL 33.00 (L) 11/26/2018   LDLDIRECT 115.8 01/23/2012   LDLCALC 108 (H) 11/26/2018   ALT 15 05/01/2019   AST 21 05/01/2019   NA 135 05/19/2019   K 5.0 05/19/2019   CL 103 05/19/2019   CREATININE 1.46  (H) 05/19/2019   BUN 23 05/19/2019   CO2 26 05/19/2019   TSH 1.65 01/03/2016   INR 1.1 04/19/2019   HGBA1C 12.2 (H) 04/17/2019   MICROALBUR 26.3 (H) 11/26/2018    Dg Foot Complete Left  Result Date: 06/21/2019 CLINICAL DATA:  Left foot diabetic ulcer EXAM: LEFT FOOT - COMPLETE 3+ VIEW COMPARISON:  04/17/2019 FINDINGS: No acute fracture or dislocation of the left foot. There is a hindfoot deformity and/or calcaneal osteotomy. Hammertoe deformities of the toes, which somewhat limit evaluation. There is a large plantar ulceration underlying the calcaneus, which appears somewhat enlarged when compared to examination dated 04/17/2019. Diffuse soft tissue edema about the foot. IMPRESSION: No acute fracture or dislocation of the left foot. There is a hindfoot deformity and/or calcaneal osteotomy. Hammertoe deformities of the toes, which somewhat limit evaluation. There is a large plantar ulceration underlying the calcaneus, which appears somewhat enlarged when compared to examination dated 04/17/2019. Diffuse soft tissue edema about the foot. Electronically Signed   By: Eddie Candle M.D.   On: 06/21/2019 14:27     Assessment & Plan:  Plan  I am having Trenee R. Thielke maintain her polyethylene glycol, atorvastatin, Insulin Lispro, Insulin Glargine, DULoxetine, ondansetron, cyclobenzaprine, Iron, docusate sodium, CALCIUM ALGINATE EX, Multivitamin Gummies Womens, hydrochlorothiazide, Ensure Max Protein, feeding supplement (NEPRO CARB STEADY), feeding supplement (PRO-STAT SUGAR FREE 64), guaiFENesin, aspirin, fluticasone, clindamycin, pregabalin, HYDROcodone-acetaminophen, and nystatin.  Meds ordered this encounter  Medications  . nystatin (MYCOSTATIN/NYSTOP) powder    Sig: Apply topically 3 (three) times daily.    Dispense:  15 g    Refill:  0    Problem List Items Addressed This Visit    None    Visit Diagnoses    Tinea corporis    -  Primary   Relevant Medications   nystatin  (MYCOSTATIN/NYSTOP) powder   Need for influenza vaccination       Suspicious  nevus       Relevant Orders   Ambulatory referral to Dermatology      Follow-up: Return if symptoms worsen or fail to improve.  Ann Held, DO

## 2019-06-27 ENCOUNTER — Encounter: Admit: 2019-06-27 | Discharge: 2019-06-27

## 2019-06-27 DIAGNOSIS — E11621 Type 2 diabetes mellitus with foot ulcer: Secondary | ICD-10-CM | POA: Diagnosis not present

## 2019-06-30 MED ORDER — METFORMIN 1,000 MG PO TAB
ORAL_TABLET | Freq: Two times a day (BID) | 1 refills | Status: DC
Start: 2019-06-30 — End: 2019-12-05

## 2019-07-01 ENCOUNTER — Other Ambulatory Visit: Payer: Self-pay

## 2019-07-01 DIAGNOSIS — M00021 Staphylococcal arthritis, right elbow: Secondary | ICD-10-CM

## 2019-07-01 MED ORDER — CLINDAMYCIN HCL 300 MG PO CAPS: 300.0000 mg | ORAL_CAPSULE | Freq: Three times a day (TID) | ORAL | 0 refills | Status: AC

## 2019-07-02 ENCOUNTER — Encounter: Admit: 2019-07-02 | Discharge: 2019-07-02

## 2019-07-03 MED ORDER — TRAZODONE 50 MG PO TAB
ORAL_TABLET | Freq: Every evening | 3 refills | Status: DC | PRN
Start: 2019-07-03 — End: 2020-05-28

## 2019-07-04 ENCOUNTER — Encounter (HOSPITAL_BASED_OUTPATIENT_CLINIC_OR_DEPARTMENT_OTHER): Payer: PRIVATE HEALTH INSURANCE | Attending: Internal Medicine

## 2019-07-04 DIAGNOSIS — B9562 Methicillin resistant Staphylococcus aureus infection as the cause of diseases classified elsewhere: Secondary | ICD-10-CM | POA: Diagnosis not present

## 2019-07-04 DIAGNOSIS — L97522 Non-pressure chronic ulcer of other part of left foot with fat layer exposed: Secondary | ICD-10-CM | POA: Insufficient documentation

## 2019-07-04 DIAGNOSIS — E11621 Type 2 diabetes mellitus with foot ulcer: Secondary | ICD-10-CM | POA: Diagnosis present

## 2019-07-04 DIAGNOSIS — L97422 Non-pressure chronic ulcer of left heel and midfoot with fat layer exposed: Secondary | ICD-10-CM | POA: Insufficient documentation

## 2019-07-04 DIAGNOSIS — I252 Old myocardial infarction: Secondary | ICD-10-CM | POA: Diagnosis not present

## 2019-07-04 DIAGNOSIS — E1140 Type 2 diabetes mellitus with diabetic neuropathy, unspecified: Secondary | ICD-10-CM | POA: Diagnosis not present

## 2019-07-04 DIAGNOSIS — I10 Essential (primary) hypertension: Secondary | ICD-10-CM | POA: Insufficient documentation

## 2019-07-04 DIAGNOSIS — M009 Pyogenic arthritis, unspecified: Secondary | ICD-10-CM | POA: Insufficient documentation

## 2019-07-04 DIAGNOSIS — I2510 Atherosclerotic heart disease of native coronary artery without angina pectoris: Secondary | ICD-10-CM | POA: Insufficient documentation

## 2019-07-09 ENCOUNTER — Ambulatory Visit: Payer: PRIVATE HEALTH INSURANCE | Admitting: Internal Medicine

## 2019-07-17 ENCOUNTER — Telehealth: Payer: Self-pay | Admitting: *Deleted

## 2019-07-17 DIAGNOSIS — M00021 Staphylococcal arthritis, right elbow: Secondary | ICD-10-CM

## 2019-07-17 NOTE — Telephone Encounter (Signed)
Yes, she may have a refill.  Thanks.

## 2019-07-17 NOTE — Telephone Encounter (Signed)
Patient called to report she is out of her Clindamycin 300 mg 3x daily she was taking. She does not have an follow up visit until 08/05/19 and wants to know if she should have a refill until that visit. Advised her will have to ask the provider and give her a call back once he responds.

## 2019-07-18 DIAGNOSIS — E11621 Type 2 diabetes mellitus with foot ulcer: Secondary | ICD-10-CM | POA: Diagnosis not present

## 2019-07-18 MED ORDER — CLINDAMYCIN HCL 300 MG PO CAPS: 300.0000 mg | ORAL_CAPSULE | Freq: Three times a day (TID) | ORAL | 0 refills | Status: DC

## 2019-07-18 NOTE — Telephone Encounter (Signed)
Rx sent to pharmacy and patient informed and reminded of follow up visit.

## 2019-07-18 NOTE — Addendum Note (Signed)
Addended by: Reggy Eye on: 07/18/2019 10:46 AM   Modules accepted: Orders

## 2019-07-22 ENCOUNTER — Encounter: Admit: 2019-07-22 | Discharge: 2019-07-22 | Payer: MEDICARE

## 2019-07-23 ENCOUNTER — Telehealth: Payer: Self-pay | Admitting: Family Medicine

## 2019-07-23 ENCOUNTER — Other Ambulatory Visit: Payer: Self-pay | Admitting: Family Medicine

## 2019-07-23 DIAGNOSIS — S42401A Unspecified fracture of lower end of right humerus, initial encounter for closed fracture: Secondary | ICD-10-CM

## 2019-07-23 DIAGNOSIS — E11621 Type 2 diabetes mellitus with foot ulcer: Secondary | ICD-10-CM

## 2019-07-23 DIAGNOSIS — M797 Fibromyalgia: Secondary | ICD-10-CM

## 2019-07-23 DIAGNOSIS — IMO0002 Reserved for concepts with insufficient information to code with codable children: Secondary | ICD-10-CM

## 2019-07-23 DIAGNOSIS — M79672 Pain in left foot: Secondary | ICD-10-CM

## 2019-07-23 NOTE — Telephone Encounter (Signed)
RequestingLebron [name] Contract: 08/16/2017 UDS: 08/082019, low risk, 11/06/2018 Last OV: 06/26/2019 Next OV: 07/29/2019 Last Refill: 06/24/2019, #120--0 RF Database:   Please advise

## 2019-07-23 NOTE — Telephone Encounter (Signed)
Callers Name: Ashby:  [id number] - [place], [place] - [address] AT [address] [phone] (Phone) [phone] (Fax)   Medication Name: Hydrocodone Otho Darner and Days remaining: 2 days - will be out Friday 9/25 Date of Last Appointment: 06/26/2019 Next OV 07/29/2019

## 2019-07-24 ENCOUNTER — Other Ambulatory Visit: Payer: Self-pay | Admitting: Family Medicine

## 2019-07-24 DIAGNOSIS — M797 Fibromyalgia: Secondary | ICD-10-CM

## 2019-07-24 DIAGNOSIS — M79672 Pain in left foot: Secondary | ICD-10-CM

## 2019-07-24 DIAGNOSIS — S42401A Unspecified fracture of lower end of right humerus, initial encounter for closed fracture: Secondary | ICD-10-CM

## 2019-07-24 MED ORDER — HYDROCODONE-ACETAMINOPHEN 5-325 MG PO TABS: 1.0000 | ORAL_TABLET | Freq: Four times a day (QID) | ORAL | 0 refills | Status: DC | PRN

## 2019-07-24 NOTE — Telephone Encounter (Signed)
Sent in Database reviewed

## 2019-07-25 ENCOUNTER — Other Ambulatory Visit: Payer: Self-pay | Admitting: Family Medicine

## 2019-07-25 ENCOUNTER — Encounter (HOSPITAL_COMMUNITY): Payer: PRIVATE HEALTH INSURANCE

## 2019-07-25 DIAGNOSIS — M79672 Pain in left foot: Secondary | ICD-10-CM

## 2019-07-25 DIAGNOSIS — S42401A Unspecified fracture of lower end of right humerus, initial encounter for closed fracture: Secondary | ICD-10-CM

## 2019-07-25 DIAGNOSIS — M797 Fibromyalgia: Secondary | ICD-10-CM

## 2019-07-25 MED ORDER — HYDROCODONE-ACETAMINOPHEN 5-325 MG PO TABS: 1.0000 | ORAL_TABLET | Freq: Four times a day (QID) | ORAL | 0 refills | Status: DC | PRN

## 2019-07-25 NOTE — Telephone Encounter (Signed)
[facility] sent to the wrong pharmacy. I called and canceled the Rx at [facility]. Rx needs to be sent to [facility].

## 2019-07-25 NOTE — Telephone Encounter (Signed)
Patient requesting a callback today once Hydrocodone has been sent to correct pharmacy.  [facility] #[id number] - [place], [place] - [address] [place] [address] [phone] (Phone) [phone] (Fax)

## 2019-07-25 NOTE — Telephone Encounter (Signed)
I sent it in again

## 2019-07-25 NOTE — Telephone Encounter (Signed)
HYDROcodone-acetaminophen (NORCO/VICODIN) 5-325 MG tablet [[id number]]   requesting it to be sent [facility] It was sent to [facility] [place]. Please advise  [facility] [address] [phone] , [phone]

## 2019-07-28 ENCOUNTER — Other Ambulatory Visit: Payer: Self-pay | Admitting: Family Medicine

## 2019-07-28 DIAGNOSIS — E785 Hyperlipidemia, unspecified: Secondary | ICD-10-CM

## 2019-07-28 DIAGNOSIS — M797 Fibromyalgia: Secondary | ICD-10-CM

## 2019-07-28 DIAGNOSIS — F419 Anxiety disorder, unspecified: Secondary | ICD-10-CM

## 2019-07-28 DIAGNOSIS — F329 Major depressive disorder, single episode, unspecified: Secondary | ICD-10-CM

## 2019-07-28 DIAGNOSIS — F32A Depression, unspecified: Secondary | ICD-10-CM

## 2019-07-29 ENCOUNTER — Encounter: Payer: PRIVATE HEALTH INSURANCE | Admitting: Family Medicine

## 2019-07-29 ENCOUNTER — Other Ambulatory Visit: Payer: Self-pay

## 2019-07-29 ENCOUNTER — Other Ambulatory Visit (HOSPITAL_COMMUNITY): Payer: Self-pay | Admitting: *Deleted

## 2019-07-29 ENCOUNTER — Encounter

## 2019-07-29 DIAGNOSIS — E1129 Type 2 diabetes mellitus with other diabetic kidney complication: Secondary | ICD-10-CM

## 2019-07-29 MED ORDER — BLOOD SUGAR DIAGNOSTIC MISC STRP
1 | ORAL_STRIP | Freq: Every day | 1 refills | 30.00000 days | Status: AC
Start: 2019-07-29 — End: 2019-07-31

## 2019-07-29 NOTE — Progress Notes (Deleted)
Virtual Visit via Video Note  I connected with [name] [name] on 07/29/19 at  3:20 PM EDT by a video enabled telemedicine application and verified that I am speaking with the correct person using two identifiers.  Location: Patient: *** Provider: ***   I discussed the limitations of evaluation and management by telemedicine and the availability of in person appointments. The patient expressed understanding and agreed to proceed.  History of Present Illness:    Observations/Objective:   Assessment and Plan:   Follow Up Instructions:    I discussed the assessment and treatment plan with the patient. The patient was provided an opportunity to ask questions and all were answered. The patient agreed with the plan and demonstrated an understanding of the instructions.   The patient was advised to call back or seek an in-person evaluation if the symptoms worsen or if the condition fails to improve as anticipated.  I provided *** minutes of non-face-to-face time during this encounter.   Ann Held, DO

## 2019-07-29 NOTE — Telephone Encounter
New prescription for test strips sent and pt will call back in couple days with readings

## 2019-07-29 NOTE — Telephone Encounter
Pt called her GI doctor started her on creon 2 weeks ago because of pancreatitis. Today she developed bumpy rash on face and around knee caps and legs. She stated she is urinating all the time and feels like her blood sugar is up. She has not taken it because she is out of strips. She called GI and they told her to call her pcp. Pt is taking her metformin 1000 bid and she restarted her amaryl  1/2 tab daily 3 days ago

## 2019-07-29 NOTE — Telephone Encounter
I am reluctant to increase her medication without knowing what her blood sugar is running.  Can she get some more strips, check blood sugars for a day or 2, and then call us with her readings?

## 2019-07-30 ENCOUNTER — Other Ambulatory Visit: Payer: Self-pay

## 2019-07-30 ENCOUNTER — Encounter (HOSPITAL_COMMUNITY)
Admission: RE | Admit: 2019-07-30 | Discharge: 2019-07-30 | Disposition: A | Discharge status: Home / Self Care | Payer: PRIVATE HEALTH INSURANCE | Source: Ambulatory Visit | Attending: Nephrology | Admitting: Nephrology

## 2019-07-30 MED ORDER — SODIUM CHLORIDE 0.9 % IV SOLN
510.0000 mg | INTRAVENOUS | Status: DC
  Filled 2019-07-30: qty 17

## 2019-07-30 NOTE — Progress Notes (Signed)
Pt stated she is going tomorrow to be scheduled for an MRI in a couple weeks.  Feraheme is a contraindication of MRI's.  Dr Carolin Sicks stated to have the pt reschedule her feraheme until after her MRI is done.

## 2019-07-30 NOTE — Discharge Instructions (Signed)
Ferumoxytol injection °What is this medicine? °FERUMOXYTOL is an iron complex. Iron is used to make healthy red blood cells, which carry oxygen and nutrients throughout the body. This medicine is used to treat iron deficiency anemia. °This medicine may be used for other purposes; ask your health care provider or pharmacist if you have questions. °COMMON BRAND NAME(S): Feraheme °What should I tell my health care provider before I take this medicine? °They need to know if you have any of these conditions: °· anemia not caused by low iron levels °· high levels of iron in the blood °· magnetic resonance imaging (MRI) test scheduled °· an unusual or allergic reaction to iron, other medicines, foods, dyes, or preservatives °· pregnant or trying to get pregnant °· breast-feeding °How should I use this medicine? °This medicine is for injection into a vein. It is given by a health care professional in a hospital or clinic setting. °Talk to your pediatrician regarding the use of this medicine in children. Special care may be needed. °Overdosage: If you think you have taken too much of this medicine contact a poison control center or emergency room at once. °NOTE: This medicine is only for you. Do not share this medicine with others. °What if I miss a dose? °It is important not to miss your dose. Call your doctor or health care professional if you are unable to keep an appointment. °What may interact with this medicine? °This medicine may interact with the following medications: °· other iron products °This list may not describe all possible interactions. Give your health care provider a list of all the medicines, herbs, non-prescription drugs, or dietary supplements you use. Also tell them if you smoke, drink alcohol, or use illegal drugs. Some items may interact with your medicine. °What should I watch for while using this medicine? °Visit your doctor or healthcare professional regularly. Tell your doctor or healthcare  professional if your symptoms do not start to get better or if they get worse. You may need blood work done while you are taking this medicine. °You may need to follow a special diet. Talk to your doctor. Foods that contain iron include: whole grains/cereals, dried fruits, beans, or peas, leafy green vegetables, and organ meats (liver, kidney). °What side effects may I notice from receiving this medicine? °Side effects that you should report to your doctor or health care professional as soon as possible: °· allergic reactions like skin rash, itching or hives, swelling of the face, lips, or tongue °· breathing problems °· changes in blood pressure °· feeling faint or lightheaded, falls °· fever or chills °· flushing, sweating, or hot feelings °· swelling of the ankles or feet °Side effects that usually do not require medical attention (report to your doctor or health care professional if they continue or are bothersome): °· diarrhea °· headache °· nausea, vomiting °· stomach pain °This list may not describe all possible side effects. Call your doctor for medical advice about side effects. You may report side effects to FDA at 1-800-FDA-1088. °Where should I keep my medicine? °This drug is given in a hospital or clinic and will not be stored at home. °NOTE: This sheet is a summary. It may not cover all possible information. If you have questions about this medicine, talk to your doctor, pharmacist, or health care provider. °© 2020 [employer] Standard (2016-12-04 20:21:10) ° °

## 2019-07-31 ENCOUNTER — Encounter: Admit: 2019-07-31 | Discharge: 2019-07-31 | Payer: MEDICARE

## 2019-07-31 DIAGNOSIS — E1129 Type 2 diabetes mellitus with other diabetic kidney complication: Secondary | ICD-10-CM

## 2019-07-31 MED ORDER — BLOOD SUGAR DIAGNOSTIC MISC STRP
1 | ORAL_STRIP | Freq: Every day | 1 refills | 30.00000 days | Status: DC
Start: 2019-07-31 — End: 2019-07-31

## 2019-07-31 MED ORDER — BLOOD SUGAR DIAGNOSTIC MISC STRP
1 | ORAL_STRIP | Freq: Every day | 1 refills | 30.00000 days | Status: AC
Start: 2019-07-31 — End: ?

## 2019-08-01 ENCOUNTER — Encounter (HOSPITAL_BASED_OUTPATIENT_CLINIC_OR_DEPARTMENT_OTHER): Payer: PRIVATE HEALTH INSURANCE | Attending: Internal Medicine | Admitting: Internal Medicine

## 2019-08-01 ENCOUNTER — Other Ambulatory Visit: Payer: Self-pay

## 2019-08-01 ENCOUNTER — Other Ambulatory Visit (HOSPITAL_COMMUNITY)
Admission: RE | Admit: 2019-08-01 | Discharge: 2019-08-01 | Disposition: A | Discharge status: Home / Self Care | Payer: PRIVATE HEALTH INSURANCE | Source: Other Acute Inpatient Hospital | Attending: Internal Medicine | Admitting: Internal Medicine

## 2019-08-01 DIAGNOSIS — B952 Enterococcus as the cause of diseases classified elsewhere: Secondary | ICD-10-CM | POA: Insufficient documentation

## 2019-08-01 DIAGNOSIS — E11621 Type 2 diabetes mellitus with foot ulcer: Secondary | ICD-10-CM | POA: Insufficient documentation

## 2019-08-01 DIAGNOSIS — M009 Pyogenic arthritis, unspecified: Secondary | ICD-10-CM | POA: Insufficient documentation

## 2019-08-01 DIAGNOSIS — L97422 Non-pressure chronic ulcer of left heel and midfoot with fat layer exposed: Secondary | ICD-10-CM | POA: Insufficient documentation

## 2019-08-01 DIAGNOSIS — I252 Old myocardial infarction: Secondary | ICD-10-CM | POA: Diagnosis not present

## 2019-08-01 DIAGNOSIS — L97522 Non-pressure chronic ulcer of other part of left foot with fat layer exposed: Secondary | ICD-10-CM | POA: Insufficient documentation

## 2019-08-01 DIAGNOSIS — G629 Polyneuropathy, unspecified: Secondary | ICD-10-CM | POA: Diagnosis not present

## 2019-08-01 DIAGNOSIS — I10 Essential (primary) hypertension: Secondary | ICD-10-CM | POA: Insufficient documentation

## 2019-08-01 DIAGNOSIS — L97423 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: Secondary | ICD-10-CM | POA: Insufficient documentation

## 2019-08-01 DIAGNOSIS — B965 Pseudomonas (aeruginosa) (mallei) (pseudomallei) as the cause of diseases classified elsewhere: Secondary | ICD-10-CM | POA: Diagnosis not present

## 2019-08-01 DIAGNOSIS — E1140 Type 2 diabetes mellitus with diabetic neuropathy, unspecified: Secondary | ICD-10-CM | POA: Diagnosis not present

## 2019-08-01 DIAGNOSIS — I2510 Atherosclerotic heart disease of native coronary artery without angina pectoris: Secondary | ICD-10-CM | POA: Diagnosis not present

## 2019-08-01 NOTE — Progress Notes (Signed)
[name] [name], [name] [name] ([id number]) Visit Report for 08/01/2019 Debridement Details Patient Name: Date of Service: [name] [name], [name] [name] 08/01/2019 3:30 PM Medical Record [id number] Patient Account Number: [id number] Date of Birth/Sex: Treating RN: 1956-05-07 (63 y.o. [name] Primary Care Provider: Carollee Herter, Kendrick Fries Other Clinician: Referring Provider: Carollee Herter, Kendrick Fries Treating Provider/Extender:Robson, Esperanza Richters in Treatment: 37 Debridement Performed for Wound #6 Left Toe Great Assessment: Performed By: Physician Ricard Dillon., MD Debridement Type: Debridement Severity of Tissue Pre Fat layer exposed Debridement: Level of Consciousness (Pre- Awake and Alert procedure): Pre-procedure Verification/Time Out Taken: Yes - 16:20 Start Time: 16:20 Pain Control: Lidocaine 5% topical ointment Total Area Debrided (L x W): 1 (cm) x 1 (cm) = 1 (cm) Tissue and other material Viable, Non-Viable, Callus, Subcutaneous, Skin: Epidermis debrided: Level: Skin/Subcutaneous Tissue Debridement Description: Excisional Instrument: Curette Bleeding: Minimum Hemostasis Achieved: Pressure End Time: 16:23 Procedural Pain: 0 Post Procedural Pain: 0 Response to Treatment: Procedure was tolerated well Level of Consciousness Awake and Alert (Post-procedure): Post Debridement Measurements of Total Wound Length: (cm) 0.3 Width: (cm) 0.2 Depth: (cm) 0.1 Volume: (cm) 0.005 Character of Wound/Ulcer Post Improved Debridement: Severity of Tissue Post Debridement: Fat layer exposed Post Procedure Diagnosis Same as Pre-procedure Electronic Signature(s) Signed: 08/01/2019 6:19:43 PM By: Linton Ham MD Signed: 08/01/2019 7:05:19 PM By: Baruch Gouty RN, BSN Entered By: Linton Ham on 08/01/2019 18:03:32 -------------------------------------------------------------------------------- HPI Details Patient Name: Date of Service: [name] [name] 08/01/2019 3:30 PM Medical Record  [id number] Patient Account Number: [id number] Date of Birth/Sex: Treating RN: 1956/09/06 (63 y.o. [name] Primary Care Provider: Carollee Herter, Kendrick Fries Other Clinician: Referring Provider: Treating Provider/Extender:Robson, Enedina Finner Weeks in Treatment: 37 History of Present Illness HPI Description: ADMISSION 11/12/2018 This is a 63 year old woman with type 2 diabetes and diabetic neuropathy. She has been dealing with a left heel plantar wound for roughly 2 years. She states this started when she pulled some skin off the area and it progressed into a wound. She also has an area on the tip of her left great toe for 1 year. She has been largely followed by Dr. Sharol Given and she had an excision of bone in the left heel in 2017. I do not see microbiology from this excision or pathology. Apparently this wound never really closed. She most recently has been using Silvadene cream. Offloading this with a scooter. The last MRI I see was in June 2018 which did not show osteomyelitis at that time. Apparently this wound is never really progressed towards healing. During her last review by Dr. Sharol Given in October it was recommended that she undergo a left BKA and she refused. She went to see a second orthopedic consult at [facility] who recommended conservative wound care to see if this will close or progress towards closure but also warned that possible surgery may be necessary. An x-ray that was done at [facility] showed an irregular calcaneal body and calcaneal tuberosity. This is probably postprocedural. Previous x-rays at [facility] had suggested heterotrophic calcifications but I do not see this. Apparently a second ointment was added to the Silvadene which the patient thinks is because some improvement. She has not been systemically unwell. No fever or chills. She thinks the second ointment that was given to her at [facility] has helped somewhat. 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 ABIs done in June 2018 showed a ABI of 1.12 on the right and 1.1 on the left she was biphasic bilaterally. ABIs in our clinic were noncompressible today. 11/20/18 on evaluation today patient is seen for her second visit here in the office although this is actually the first visit with me concerning an issue that she's having with her great toe and heel of the left foot. Fortunately she does not appear to be have any discomfort at this time and she does have a scooter in order to offload her foot she also has a boot for offloading. With that being said this is something that appears to have been going on for some time she was seeing Dr. due to his recommendation was that she was going to require a below knee amputation. With that being said she wanted to come to the wound center but according to the patient Dr. Sharol Given told her that "we could not help her". Nonetheless she saw another provider who suggested that it may be worth a shot for [name] to try and help her out if it all possible. Nonetheless she decided that it would be worth a trial since otherwise any the way she's gonna end up with an amputation. Obviously if we can heal the wound then that will not be the case. Again I explained to her that obviously there are no guarantees but we will give this a good try and attempts to get the wound to heal. 1/30; the patient continues to have areas on the left plantar heel and the left plantar great toe. The more worrisome area is the heel. She went for MRI on Saturday but this could not be done because she had silver alginate in the wound bed. I would like to get this rebooked. If she does not have  osteomyelitis she will need a total contact cast. We have been using silver alginate in the wounds 2/7; patient has wounds on her left plantar heel and left plantar great toe. The area on the heel has some depth although it looks about the same today. Her MRI will finally be done tomorrow. If she has osteomyelitis in the heel and then we will need to consider her for IV antibiotics and hyperbaric oxygen. If the MRI is negative she will need a total contact cast although she is wearing a cam boot and using a scooter at present. Will be using silver alginate. She will take it off tomorrow in preparation for the MRI 2/14; the patient's MRI showed extensive surgical changes with a large portion of her calcaneus removed on the left secondary to her previous surgery by Dr. Sharol Given however there is no evidence of osteomyelitis. She would therefore be a candidate for a total contact cast and we applied this for the first time today 2/21; silver collagen total contact cast. The area on the plantar left great toe is "healed" still a lot of callus on this area. Dimensions on the plantar heel not too much different some epithelialization is present however. This is an improvement 12/25/18 on evaluation today patient actually is seen for follow-up concerning issues that she has been having with her cast she states she feels like it's wet and squishy in the bottom of her cast. The reason she does come in to have this evaluated and see what is going on. Fortunately there's no  signs of infection at this time was the cast was removed. 3/6; the patient's area on the tip of her right great toe is callused but there is no open area here. She still has the fairly extensive area in the left heel. We have been using silver collagen in the wound. 01/08/19 on evaluation today patient actually appears to be doing very well in regard to her heel ulcer in my pinion to see if you shown signs of improvement which is excellent  news. There's no evidence of infection. She continues to have quite a bit of drainage but fortunately again this doesn't seem to be hindering her healing. 3/20 -Patient's foot ulcer on the left appears to be doing well, the dimensions appear encouraging, we have been using total contact cast with Prisma and will continue doing that 3/27; patient continues to make nice progress on the left plantar heel. She is using silver collagen under a total contact cast. It is been a while since I have seen this wound and it really looks a lot better. Smaller with healthy granulation 4/3; she continues to make nice progress on the left plantar heel. Using silver collagen under a total contact cast 4/10; left plantar heel. Again skin over the surface of the wound with not much in the way of adherence. This results in undermining. Using silver collagen changed to silver alginate 4/17 left plantar heel. Again not much improvement. There is no undermining today no debridement was required we used silver alginate last time because of excessive moisture 4/24 left plantar heel. Again not as much improvement as I would have liked. About 3 mm of depth. Thick callused skin around the circumference. Using silver alginate 5/1; left plantar heel this is improved this week. Less depth epithelialization is present. Using silver alginate alginate under a total contact cast 5/8; left plantar heel dimensions are about the same. The depth appears to be improved. I use silver collagen starting today under the cast 5/15 left plantar heel. Arrives today with a 2-day history of feeling like something had "slipped" while walking her dog in the cast. Unfortunately extensive area relatively to the small wound of undermining superiorly denuded epithelium. 5/22-Patient returns at 1 week after being taken off the TCC on account of some fluid collection that was debrided and cultured at last visit from the plantar ulcer on the left heel.  The culture results are polymicrobial with Klebsiella, enterococcus, Proteus growth these organisms are sensitive to Cipro except with enterococcus which is sensitive to ampicillin but patient is highly allergic to penicillin according to her. 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 5/29; the patient arrived last week with a new wound on her left plantar great toe. With regards to the culture that I did 2 weeks ago of her deteriorating heel wound this grew Klebsiella and enterococcus. She was given Cipro however the enterococcus would not be covered well by a quinolone. She is allergic to penicillin. I will give her linezolid 600 twice daily x5 days 6/12; the patient continues to have a wound on her left and after she returns to the beach there is undermining laterally. She has the new wound from this 2 weeks or so ago on the plantar tip of her left great toe. She had a fall today scraping the dorsal surface of the left fifth 7/14; READMISSION since the patient was last here she was hospitalized from  04/15/2019 through 04/22/2019. She had presented to an outside ER after falling and hitting her elbow. She had had previous surgery on the elbow and fractures several years ago. An x-ray was negative and she was sent home. She is readmitted with sepsis and acute renal failure secondary to septic arthritis of the elbow. Cultures apparently showed pansensitive staph aureus however she has a severe beta-lactam allergy. She was treated with vancomycin. Apparently the vancomycin is completed and her PICC line is removed although she is seeing Dr. Megan Salon tomorrow due to continued pain and swelling. In the hospital she had an IandD by Dr. [name] of orthopedics. The original surgery was on 04/17/2019 and it was felt that she had septic arthritis. She is still having a lot of pain and swelling in the right elbow and  apparently is due to see Dr. Megan Salon tomorrow and what she thinks is that she will be restarted on antibiotics. With regards to her heel wounds/left foot wounds. Her arterial studies were checked and her ABIs were within normal limits. X-ray showed plantar foot ulcer negative for osteomyelitis postop resection of the posterior calcaneus. She has been using silver alginate to the heel 8/4-Patient returns after being seen on 7/14, we are using silver alginate to the calcaneal wound, she is continuing to receive care for her right elbow septic arthritis 8/14- Patient returns after 1 week, she is being seen by the surgical group for her right elbow, she is here for the left calcaneal wound for which we are using silver alginate this is about the same 8/21; this is a patient I readmitted to the clinic 5 weeks ago. She is continuing to have difficulties with a septic arthritis of the right elbow she had after a fall and apparently has had surgical IandD's since the last time we saw her. She is also following with Dr. Megan Salon of infectious disease and is apparently on 3 oral antibiotics although at the time of this dictation I am not sure what they are. She comes in today with a necrotic surface on the left great toe with a blister laterally. This was still clearly an open wound. She had purulent drainage coming out of this. The original wound on the plantar aspect of her right heel in the setting of a Charcot foot is deep not open to bone but certainly not any better at all. We have been using silver alginate 8/28; dealing with septic arthritis of the right elbow. May need to go for further surgery here in the second week of September. She had a blister on the left great toe that was purulent [place] draining last week although a culture did not grow anything [already on antibiotics through infectious disease for her elbow]. The punched-out area on her heel is just like it was when she first came in.  This almost closed with a total contact cast there are no options for that now. The patient states she cannot stay off her foot having to do housework X-ray of the foot showed a moderate bunion and severe degenerative changes at the first metatarsal phalangeal joint there was a moderate plantar calcaneal spur. We will need to check the location of this. 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 It is oral. We are using silver collagen to the punched-out area on her left heel and  to the summer superficial area of the left great toe. She is offloading this is best she can although judging by the amount of callus it is not enough. She thinks she has enough strength in her right elbow now to use her scooter. 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 Hale Bogus notes and infectious disease. We are using silver collagen to the punched-out areas on her left heel and to the plantar aspect of her left great toe she is now using her scooter to get around and to help offload these areas 10/2; 2-week follow-up. Still on clindamycin I believe for the left elbow she is going for an MRI of the elbow over the weekend. She is then going to see orthopedics and infectious disease. The area on the left heel is certainly no better. This does not probe to bone however there is green drainage. She has an area on the tip of her toe. The area on the heel is in a wound we almost closed at one point with total contact casting. Electronic Signature(s) Signed: 08/01/2019 6:19:43 PM By: Linton Ham MD Entered By: Linton Ham on 08/01/2019  18:04:28 -------------------------------------------------------------------------------- Physical Exam Details Patient Name: Date of Service: [name] [name], [name] [name] 08/01/2019 3:30 PM Medical Record [id number] Patient Account Number: [id number] Date of Birth/Sex: Treating RN: 1956-09-06 (63 y.o. [name] Primary Care Provider: [name] Other Clinician: Referring Provider: Treating Provider/Extender:Robson, Lennie Odor CHASE, Tora Kindred in Treatment: 37 Constitutional Patient is hypertensive.. Pulse regular and within target range for patient.[name] Kitchen Respirations regular, non-labored and within target range.. Temperature is normal and within the target range for the patient.[name] Kitchen Appears in no distress. Notes Wound exam; deep punched out area on the plantar heel. This does not probe to bone Still a wound on the tip of her left great toe this requires debridement although it cleans up quite nicely. Hemostasis with direct pressure I am concerned about the amount of swelling in the calcaneus itself. Electronic Signature(s) Signed: 08/01/2019 6:19:43 PM By: Linton Ham MD Entered By: Linton Ham on 08/01/2019 18:07:45 -------------------------------------------------------------------------------- Physician Orders Details Patient Name: Date of Service: [name] [name], [name] [name] 08/01/2019 3:30 PM Medical Record [id number] Patient Account Number: [id number] Date of Birth/Sex: Treating RN: 1956/07/20 (63 y.o. 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 Dressing Change Frequency Wound #3 Left Calcaneus Change Dressing every other day. Wound #6 Left Toe Great Change Dressing every other day. Wound Cleansing Wound #3 Left Calcaneus May shower and wash wound with soap and water. Wound #6 Left Toe Great May shower and wash wound with soap and water. Primary Wound Dressing Wound #3 Left Calcaneus Silver Collagen - to line wound bed, use saline moistened gauze to fill space behind collagen Wound #6 Left Toe Great Silver Collagen - moisten with saline Secondary Dressing Wound #3 Left Calcaneus Kerlix/Rolled Gauze Dry Gauze Other: - felt callous pad Wound #6 Left Toe Great Kerlix/Rolled  Gauze Dry Gauze Other: - felt callous pad or foam donut Edema Control Elevate legs to the level of the heart or above for 30 minutes daily and/or when sitting, a frequency of: - throughout the day Off-Loading Other: - use knee scooter or motorized chair , minimal weight bearing left foot Laboratory Bacteria identified in Unspecified specimen by Anaerobe culture (MICRO) - left heel LOINC Code: 982-6 Convenience Name: Anerobic culture Radiology MRI, lower extremity with/without contrast left foot - left heel and left great toe diabetic foot ulcers CPT - (ICD10 L97.423 - 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 Provider: Linton Ham MD Date of Birth: 11/05/1955 NPI#: [id number] Sex: F DEA#: [id number] Phone #: [phone] License #: [id number] Patient Address: [place] [address] [place], [place] [address] [place], [place] [address] [phone] Allergies mushroom Reaction: anaphylaxis Severity: Severe penicillin Reaction: throat swelling Severity: Severe rosemary Reaction: anaphylaxis Severity: Severe Shellfish Containing Products Reaction: hives, swelling Severity: Severe bee venom protein (honey bee) Reaction: hives, swelling Severity: Moderate Fish Containing Products Reaction: hives, swelling tomato, raw Reaction: hives aloe vera Reaction: rash Provider's Orders MRI, lower extremity with/without contrast left foot - ICD10: L97.423 - left heel and left great toe diabetic foot ulcers CPT Signature(s): Date(s): Electronic Signature(s) Signed: 08/01/2019 6:19:43 PM By: Linton Ham MD Signed: 08/01/2019 7:05:19 PM By: Baruch Gouty RN, BSN Entered By: Baruch Gouty on 08/01/2019 16:47:21 --------------------------------------------------------------------------------  Problem List Details Patient Name: Date of Service: [name] [name] 08/01/2019 3:30 PM Medical Record [id number] Patient Account Number: [id number] Date of Birth/Sex: Treating RN: [birth year] (63 y.o. [name] Primary Care Provider: Carollee Herter, Kendrick Fries Other Clinician: Referring Provider: Treating Provider/Extender:Robson, Enedina Finner Weeks in Treatment: 37 Active Problems ICD-10 Evaluated Encounter Code Description Active Date Today Diagnosis L97.423 Non-pressure chronic ulcer of left heel and midfoot 11/12/2018 No Yes with necrosis of muscle L97.522 Non-pressure chronic ulcer of other part of left foot 11/12/2018 No Yes with fat layer exposed E11.621 Type 2 diabetes mellitus with foot ulcer 11/12/2018 No Yes Inactive Problems Resolved Problems Electronic Signature(s) Signed: 08/01/2019 6:19:43 PM By: Linton Ham MD Entered By: Linton Ham on 08/01/2019  18:03:15 -------------------------------------------------------------------------------- Progress Note Details Patient Name: Date of Service: [name] [name] 08/01/2019 3:30 PM Medical Record [id number] Patient Account Number: [id number] Date of Birth/Sex: Treating RN: August 22, 1956 (63 y.o. [name] Primary Care Provider: Carollee Herter, Kendrick Fries Other Clinician: Referring Provider: Treating Provider/Extender:Robson, Enedina Finner Weeks in Treatment: 57 Subjective History of Present Illness (HPI) ADMISSION 11/12/2018 This is a 63 year old woman with type 2 diabetes and diabetic neuropathy. She has been dealing with a left heel plantar wound for roughly 2 years. She states this started when she pulled some skin off the area and it progressed into a wound. She also has an area on the tip of her left great toe for 1 year. She has been largely followed by Dr. Sharol Given and she had an excision of bone in the left heel in 2017. I do not see microbiology from this excision or pathology. Apparently this wound never really closed. She most recently has been using Silvadene cream. Offloading this with a scooter. The last MRI I  see was in June 2018 which did not show osteomyelitis at that time. Apparently this wound is never really progressed towards healing. During her last review by Dr. Sharol Given in October it was recommended that she undergo a left BKA and she refused. She went to see a second orthopedic consult at [facility] who recommended conservative wound care to see if this will close or progress towards closure but also warned that possible surgery may be necessary. An x-ray that was done at [facility] showed an irregular calcaneal body and calcaneal tuberosity. This is probably postprocedural. Previous x-rays at [facility] had suggested heterotrophic calcifications but I do not see this. Apparently a second ointment was added to the Silvadene which the patient thinks is because  some improvement. She has not been systemically unwell. No fever or chills. She thinks the second ointment that was given to her at [facility] has helped somewhat. 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 ABIs done in June 2018 showed a ABI of 1.12 on the right and 1.1 on the left she was biphasic bilaterally. ABIs in our clinic were noncompressible today. 11/20/18 on evaluation today patient is seen for her second visit here in the office although this is actually the first visit with me concerning an issue that she's having with her great toe and heel of the left foot. Fortunately she does not appear to be have any discomfort at this time and she does have a scooter in order to offload her foot she also has a boot for offloading. With that being said this is something that appears to have been going on for some time she was seeing Dr. due to his recommendation was that she was going to require a below knee amputation. With that being said she wanted to come to the wound center but according to the patient Dr. Sharol Given told her that "we could not help her". Nonetheless she saw another provider who suggested that it may be worth a shot for [name] to try and help her out if it all possible. Nonetheless she decided that it would be worth a trial since otherwise any the way she's gonna end up with an amputation. Obviously if we can heal the wound then that will not be the case. Again I explained to her that obviously there are no guarantees but we will give this a good try and attempts to get the wound to heal. 1/30; the patient continues to have areas on the left plantar heel and the left plantar great toe. The  more worrisome area is the heel. She went for MRI on Saturday but this could not be done because she had silver alginate in the wound bed. I would like to get this rebooked. If she does not have osteomyelitis she will need a total contact cast. We have been using silver alginate in the wounds 2/7; patient has wounds on her left plantar heel and left plantar great toe. The area on the heel has some depth although it looks about the same today. Her MRI will finally be done tomorrow. If she has osteomyelitis in the heel and then we will need to consider her for IV antibiotics and hyperbaric oxygen. If the  MRI is negative she will need a total contact cast although she is wearing a cam boot and using a scooter at present. Will be using silver alginate. She will take it off tomorrow in preparation for the MRI 2/14; the patient's MRI showed extensive surgical changes with a large portion of her calcaneus removed on the left secondary to her previous surgery by Dr. Sharol Given however there is no evidence of osteomyelitis. She would therefore be a candidate for a total contact cast and we applied this for the first time today 2/21; silver collagen total contact cast. The area on the plantar left great toe is "healed" still a lot of callus on this area. Dimensions on the plantar heel not too much different some epithelialization is present however. This is an improvement 12/25/18 on evaluation today patient actually is seen for follow-up concerning issues that she has been having with her cast she states she feels like it's wet and squishy in the bottom of her cast. The reason she does come in to have this evaluated and see what is going on. Fortunately there's no signs of infection at this time was the cast was removed. 3/6; the patient's area on the tip of her right great toe is callused but there is no open area here. She still has the fairly extensive area in the left heel. We have been using silver collagen  in the wound. 01/08/19 on evaluation today patient actually appears to be doing very well in regard to her heel ulcer in my pinion to see if you shown signs of improvement which is excellent news. There's no evidence of infection. She continues to have quite a bit of drainage but fortunately again this doesn't seem to be hindering her healing. 3/20 -Patient's foot ulcer on the left appears to be doing well, the dimensions appear encouraging, we have been using total contact cast with Prisma and will continue doing that 3/27; patient continues to make nice progress on the left plantar heel. She is using silver collagen under a total contact cast. It is been a while since I have seen this wound and it really looks a lot better. Smaller with healthy granulation 4/3; she continues to make nice progress on the left plantar heel. Using silver collagen under a total contact cast 4/10; left plantar heel. Again skin over the surface of the wound with not much in the way of adherence. This results in undermining. Using silver collagen changed to silver alginate 4/17 left plantar heel. Again not much improvement. There is no undermining today no debridement was required we used silver alginate last time because of excessive moisture 4/24 left plantar heel. Again not as much improvement as I would have liked. About 3 mm of depth. Thick callused skin around the circumference. Using silver alginate 5/1; left plantar heel this is improved this week. Less depth epithelialization is present. Using silver alginate alginate under a total contact cast 5/8; left plantar heel dimensions are about the same. The depth appears to be improved. I use silver collagen starting today under the cast 5/15 left plantar heel. Arrives today with a 2-day history of feeling like something had "slipped" while walking her dog in the cast. Unfortunately extensive area relatively to the small wound of undermining superiorly denuded  epithelium. 5/22-Patient returns at 1 week after being taken off the [facility] on account of some fluid collection that was debrided and cultured at last visit from the plantar ulcer on the left heel. The culture results  are polymicrobial with Klebsiella, enterococcus, Proteus growth these organisms are sensitive to Cipro except with enterococcus which is sensitive to ampicillin but patient is highly allergic to penicillin according to her. 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 5/29; the patient arrived last week with a new wound on her left plantar great toe. With regards to the culture that I did 2 weeks ago of her deteriorating heel wound this grew Klebsiella and enterococcus. She was given Cipro however the enterococcus would not be covered well by a quinolone. She is allergic to penicillin. I will give her linezolid 600 twice daily x5 days 6/12; the patient continues to have a wound on her left and after she returns to the beach there is undermining laterally. She has the new wound from this 2 weeks or so ago on the plantar tip of her left great toe. She had a fall today scraping the dorsal surface of the left fifth 7/14; READMISSION since the patient was last here she was hospitalized from 04/15/2019 through 04/22/2019. She had presented to an outside ER after falling and hitting her elbow. She had had previous surgery on the elbow and fractures several years ago. An x-ray was negative and she was sent home. She is readmitted with sepsis and acute renal failure secondary to septic arthritis of the elbow. Cultures apparently showed pansensitive staph aureus however she has a severe beta-lactam allergy. She was treated with vancomycin. Apparently the vancomycin is completed and her PICC line is removed although she is seeing Dr. Megan Salon tomorrow due to continued pain and swelling. In the hospital she had an  IandD by Dr. [name] of orthopedics. The original surgery was on 04/17/2019 and it was felt that she had septic arthritis. She is still having a lot of pain and swelling in the right elbow and apparently is due to see Dr. Megan Salon tomorrow and what she thinks is that she will be restarted on antibiotics. With regards to her heel wounds/left foot wounds. Her arterial studies were checked and her ABIs were within normal limits. X-ray showed plantar foot ulcer negative for osteomyelitis postop resection of the posterior calcaneus. She has been using silver alginate to the heel 8/4-Patient returns after being seen on 7/14, we are using silver alginate to the calcaneal wound, she is continuing to receive care for her right elbow septic arthritis 8/14- Patient returns after 1 week, she is being seen by the surgical group for her right elbow, she is here for the left calcaneal wound for which we are using silver alginate this is about the same 8/21; this is a patient I readmitted to the clinic 5 weeks ago. She is continuing to have difficulties with a septic arthritis of the right elbow she had after a fall and apparently has had surgical IandD's since the last time we saw her. She is also following with Dr. Megan Salon of infectious disease and is apparently on 3 oral antibiotics although at the time of this dictation I am not sure what they are. She comes in today with a necrotic surface on the left great toe with a blister laterally. This was still clearly an open wound. She had purulent drainage coming out of this. The original wound on the plantar aspect of her right heel in the setting of a Charcot foot is deep not open to bone but certainly not any better at all. We have been using  silver alginate 8/28; dealing with septic arthritis of the right elbow. May need to go for further surgery here in the second week of September. She had a blister on the left great toe that was purulent [place] draining last  week although a culture did not grow anything [already on antibiotics through infectious disease for her elbow]. The punched-out area on her heel is just like it was when she first came in. This almost closed with a total contact cast there are no options for that now. The patient states she cannot stay off her foot having to do housework X-ray of the foot showed a moderate bunion and severe degenerative changes at the first metatarsal phalangeal joint there was a moderate plantar calcaneal spur. We will need to check the location of this. 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 It is oral. We are using silver collagen to the punched-out area on her left heel and to the summer superficial area of the left great toe. She is offloading this is best she can although judging by the amount of callus it is not enough. She thinks she has enough strength in her right elbow now to use her scooter. 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 Hale Bogus notes and infectious disease. We are using silver collagen to the punched-out areas on her left heel and to the plantar aspect of her left great toe she is now using her scooter to get around and to help offload these areas 10/2; 2-week follow-up. Still on clindamycin I believe for the left elbow she is going for an MRI of the elbow over the weekend. She is then going to see orthopedics and infectious disease. The area on the left heel is certainly no better. This does not probe to bone however there is green drainage. She has an area on the tip of her toe. The area on the heel is in a wound we almost closed at one point with total contact  casting. Objective Constitutional Vitals Time Taken: 3:44 PM, Height: 62 in, Weight: 180 lbs, BMI: 32.9, Temperature: 97.9 F, Pulse: 78 bpm, Respiratory Rate: 18 breaths/min, Blood Pressure: 145/71 mmHg. Integumentary (Hair, Skin) Wound #3 status is Open. Original cause of wound was Trauma. The wound is located on the Left Calcaneus. The wound measures 2.7cm length x 2.2cm width x 0.7cm depth; 4.665cm^2 area and 3.266cm^3 volume. There is Fat Layer (Subcutaneous Tissue) Exposed exposed. There is no tunneling or undermining noted. There is a medium amount of serosanguineous drainage noted. The wound margin is flat and intact. There is medium (34-66%) pink granulation within the wound bed. There is a medium (34-66%) amount of necrotic tissue within the wound bed including Adherent Slough. Wound #6 status is Open. Original cause of wound was Blister. The wound is located on the Left Toe Great. The wound measures 0.3cm length x 0.2cm width x 0.2cm depth; 0.047cm^2 area and 0.009cm^3 volume. There is Fat Layer (Subcutaneous Tissue) Exposed exposed. There is no tunneling noted, however, there is undermining starting at 6:00 and ending at 12:00 with a maximum distance of 0.2cm. There is a small amount of serosanguineous drainage  noted. The wound margin is flat and intact. There is large (67-100%) pink granulation within the wound bed. There is no necrotic tissue within the wound bed. 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 There was a Excisional Skin/Subcutaneous Tissue Debridement with a total area of 1 sq cm  performed by Ricard Dillon., MD. With the following instrument(s): Curette to remove Viable and Non-Viable tissue/material. Material removed includes Callus, Subcutaneous Tissue, and Skin: Epidermis after achieving pain control using Lidocaine 5% topical ointment. No specimens were taken. A time out was conducted at 16:20, prior to the start of the procedure. A Minimum amount of bleeding was controlled with Pressure. The procedure was tolerated well with a pain level of 0 throughout and a pain level of 0 following the procedure. Post Debridement Measurements: 0.3cm length x 0.2cm width x 0.1cm depth; 0.005cm^3 volume. Character of Wound/Ulcer Post Debridement is improved. Severity of Tissue Post Debridement is: Fat layer exposed. Post procedure Diagnosis Wound #6: Same as Pre-Procedure Plan Follow-up Appointments: Return Appointment in 1 week. Dressing Change Frequency: Wound #3 Left Calcaneus: Change Dressing every other day. Wound #6 Left Toe Great: Change Dressing every other day. Wound Cleansing: Wound #3 Left Calcaneus: May shower and wash wound with soap and water. Wound #6 Left Toe Great: May shower and wash wound with soap and water. Primary Wound Dressing: Wound #3 Left Calcaneus: Silver Collagen - to line wound bed, use saline moistened gauze to fill space behind collagen Wound #6 Left Toe Great: Silver Collagen - moisten with saline Secondary Dressing: Wound #3 Left Calcaneus: Kerlix/Rolled Gauze Dry Gauze Other: - felt callous pad Wound #6 Left Toe Great: Kerlix/Rolled Gauze Dry Gauze Other: - felt callous pad or foam donut Edema Control: Elevate legs to the level of the heart or above for 30 minutes daily and/or when sitting, a frequency of: - throughout the day Off-Loading: Other: - use knee scooter or motorized chair , minimal weight bearing left foot Radiology ordered were: MRI, lower extremity with/without contrast left foot - left heel and left great  toe diabetic foot ulcers CPT Laboratory ordered were: Anerobic culture - left heel 1. Continue with silver collagen to both wound areas 2. The entire calcaneus is swollen. Although this does not probe to bone I think she needs a repeat MRI we ordered this 3. Culture of the left heel was done I did not alter her antibiotics Electronic Signature(s) Signed: 08/01/2019 6:19:43 PM By: Linton Ham MD Entered By: Linton Ham on 08/01/2019 18:06:51 -------------------------------------------------------------------------------- SuperBill Details Patient Name: Date of Service: [name] [name], [name] [name] 08/01/2019 Medical Record [id number] Patient Account Number: [id number] Date of Birth/Sex: Treating RN: 09/18/1956 (63 y.o. 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 L97.522 Non-pressure chronic ulcer of other part of left foot with fat layer exposed E11.621 Type 2 diabetes mellitus with foot ulcer Facility Procedures CPT4 Code Description: [id number] 11042 - DEB SUBQ TISSUE 20 SQ CM/< ICD-10 Diagnosis Description L97.522 Non-pressure chronic  ulcer of other part of left foot with Modifier: fat layer ex Quantity: 1 posed Physician Procedures CPT4 Code Description: [id number] 11042 - WC PHYS SUBQ TISS 20 SQ CM ICD-10 Diagnosis Description L97.522 Non-pressure chronic ulcer of other part of left foot with Modifier: fat layer exp Quantity: 1 osed Electronic Signature(s) Signed: 08/01/2019 6:19:43 PM By: Linton Ham MD Signed: 08/01/2019 7:05:19 PM By: Baruch Gouty RN, BSN Entered By: Baruch Gouty on 08/01/2019 16:48:06

## 2019-08-04 ENCOUNTER — Other Ambulatory Visit: Payer: Self-pay | Admitting: Internal Medicine

## 2019-08-04 ENCOUNTER — Other Ambulatory Visit (HOSPITAL_COMMUNITY): Payer: Self-pay | Admitting: Internal Medicine

## 2019-08-04 ENCOUNTER — Other Ambulatory Visit (HOSPITAL_COMMUNITY): Payer: Self-pay | Admitting: Orthopedic Surgery

## 2019-08-04 ENCOUNTER — Other Ambulatory Visit: Payer: Self-pay | Admitting: Orthopedic Surgery

## 2019-08-04 DIAGNOSIS — L97423 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: Secondary | ICD-10-CM

## 2019-08-04 DIAGNOSIS — M25521 Pain in right elbow: Secondary | ICD-10-CM

## 2019-08-04 LAB — AEROBIC CULTURE W GRAM STAIN (SUPERFICIAL SPECIMEN): Gram Stain: NONE SEEN

## 2019-08-04 NOTE — Progress Notes (Signed)
[name] [name], [name] [name] ([id number]) Visit Report for 08/01/2019 Arrival Information Details Patient Name: Date of Service: [name] [name], [name] [name] 08/01/2019 3:30 PM Medical Record [name] [name]:[id number] Patient Account Number: [id number] Date of Birth/Sex: Treating RN: 03/02/1956 (63 y.o. [name] [name] Primary Care [name] [name]: [name] [name], [name] [name] Other Clinician: Referring [name] [name]: Treating [name] [name]/Extender:[name] [name] in Treatment: 64 Visit Information History Since Last Visit Added or deleted any medications: No Patient Arrived: Wheel Chair Any new allergies or adverse No Arrival Time: 15:42 reactions: Accompanied By: alone Had a fall or experienced change in No Transfer Assistance: None activities of daily living that may affect Patient Identification Verified: Yes risk of falls: Secondary Verification Process Yes Signs or symptoms of abuse/neglect No Completed: since last visito Patient Requires Transmission- No Hospitalized since last visit: No Based Precautions: Implantable device outside of the clinic No Patient Has Alerts: Yes excluding Patient Alerts: R ABI = 1.12, L cellular tissue based products placed ABI = 1.1 in the center since last visit: Has Dressing in Place as Prescribed: Yes Has Footwear/Offloading in Place as Yes Prescribed: Left: Other:heel offloading sandal Pain Present Now: No Electronic Signature(s) Signed: 08/04/2019 6:17:12 PM By: [name] Hurst RN, BSN Entered By: [name] [name] on 08/01/2019 15:42:50 -------------------------------------------------------------------------------- Encounter Discharge Information Details Patient Name: Date of Service: [name] [name] 08/01/2019 3:30 PM Medical Record [name] [name]:[id number] Patient Account Number: [id number] Date of Birth/Sex: Treating RN: 08/12/1956 (63 y.o. [name] [name] Primary Care Benjaman Artman: [name] [name] Other Clinician: Referring Ebony Yorio: Treating  [name] [name]/Extender:[name] [name] in Treatment: 37 Encounter Discharge Information Items Post Procedure Vitals Discharge Condition: Stable Temperature (F): 97.9 Ambulatory Status: Wheelchair Pulse (bpm): 78 Discharge Destination: Home Respiratory Rate (breaths/min): 18 Transportation: Private Auto Blood Pressure (mmHg): 145/71 Accompanied By: self Schedule Follow-up Appointment: Yes Clinical Summary of Care: Notes hover-around. Electronic Signature(s) Signed: 08/01/2019 6:13:58 PM By: [name] [name] Entered By: [name] [name] on 08/01/2019 18:05:16 -------------------------------------------------------------------------------- Lower Extremity Assessment Details Patient Name: Date of Service: [name] [name] 08/01/2019 3:30 PM Medical Record [name] [name]:[id number] Patient Account Number: [id number] Date of Birth/Sex: Treating RN: 1955-12-02 (63 y.o. [name] [name] Primary Care Maisyn Nouri: [name] [name], [name] [name] Other Clinician: Referring Glema Takaki: Treating [name] [name]/[name] [name], [name] [name], [name] [name] in Treatment: 37 Edema Assessment Assessed: [Left: No] [Right: No] Edema: [Left: N] [Right: o] Calf Left: Right: Point of Measurement: 28 cm From Medial Instep 36 cm cm Ankle Left: Right: Point of Measurement: 10 cm From Medial Instep 23 cm cm Vascular Assessment Pulses: Dorsalis Pedis Palpable: [Left:Yes] Electronic Signature(s) Signed: 08/04/2019 6:17:12 PM By: [name] Hurst RN, BSN Entered By: [name] [name] on 08/01/2019 15:48:10 -------------------------------------------------------------------------------- Multi Wound Chart Details Patient Name: Date of Service: [name] [name] 08/01/2019 3:30 PM Medical Record [id number] Patient Account Number: [id number] Date of Birth/Sex: Treating RN: Apr 05, 1956 (63 y.o. 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] [6:Lower Extremity] Comorbid History: [3:Cataracts, Anemia, Coronary Artery Disease, Coronary Artery Disease, Hypertension, Myocardial Hypertension, Myocardial Infarction, Type II Diabetes, Infarction, Type II Diabetes, Osteoarthritis, Osteomyelitis, Neuropathy  Osteomyelitis, Neuropathy] [6:Cataracts, Anemia, Osteoarthritis,] [N/A:N/A] Date Acquired: [3:11/12/2016] [6:06/19/2019] [N/A:N/A] Weeks of Treatment: [3:37] [6:6] [N/A:N/A]  Wound Status: [3:Open] [6:Open] [N/A:N/A] Measurements L x W x D 2.7x2.2x0.7 [6:0.3x0.2x0.2] [N/A:N/A] (cm) Area (cm) : [3:4.665] [6:0.047] [N/A:N/A] Volume (cm) : [3:3.266] [6:0.009] [N/A:N/A] % Reduction in Area: [3:12.90%] [6:-487.50%] [N/A:N/A] % Reduction in Volume: 49.20% [6:-800.00%] [N/A:N/A] Starting Position 1 [6:6] (o'clock): Ending Position 1 [6:12] (o'clock): Maximum Distance 1 [6:0.2] (cm): Undermining: [3:No] [6:Yes] [N/A:N/A] Classification: [3:Grade 2] [6:Grade 2] [N/A:N/A] Exudate Amount: [3:Medium] [6:Small] [N/A:N/A] Exudate Type: [3:Serosanguineous] [6:Serosanguineous] [N/A:N/A] Exudate Color: [3:red, brown] [6:red, brown] [N/A:N/A] Wound Margin: [3:Flat and Intact] [6:Flat and Intact] [N/A:N/A] Granulation Amount: [3:Medium (34-66%)] [6:Large (67-100%)] [N/A:N/A] Granulation Quality: [3:Pink] [6:Pink] [N/A:N/A] Necrotic Amount: [3:Medium (34-66%)] [6:None Present (0%)] [N/A:N/A] Exposed Structures: [3:Fat Layer (Subcutaneous Fat  Layer (Subcutaneous N/A Tissue) Exposed: Yes Fascia: No Tendon: No Muscle: No Joint: No Bone: No] [6:Tissue) Exposed: Yes Fascia: No Tendon: No Muscle: No Joint: No Bone: No] Epithelialization: [3:None] [6:Small (1-33%)] [N/A:N/A] Debridement: [3:N/A] [6:Debridement - Excisional] [N/A:N/A] Pre-procedure [3:N/A] [6:16:20] [N/A:N/A] Verification/Time Out Taken: Pain Control: [3:N/A] [6:Lidocaine 5% topical ointment] [N/A:N/A] Tissue Debrided: [3:N/A] [6:Callus, Subcutaneous] [N/A:N/A] Level: [3:N/A] [6:Skin/Subcutaneous Tissue] [N/A:N/A] Debridement Area (sq cm):N/A [6:1] [N/A:N/A] Instrument: [3:N/A] [6:Curette] [N/A:N/A] Bleeding: [3:N/A] [6:Minimum] [N/A:N/A] Hemostasis Achieved: [3:N/A] [6:Pressure] [N/A:N/A] Procedural Pain: [3:N/A] [6:0] [N/A:N/A] Post Procedural Pain: [3:N/A] [6:0] [N/A:N/A] Debridement Treatment N/A [6:Procedure was tolerated] [N/A:N/A] Response: [6:well] Post Debridement [3:N/A] [6:0.3x0.2x0.1] [N/A:N/A] Measurements L x W x D (cm) Post Debridement [3:N/A] [6:0.005] [N/A:N/A] Volume: (cm) Procedures Performed: N/A [6:Debridement] [N/A:N/A] Treatment Notes Wound #3 (Left Calcaneus) 1. Cleanse With Wound Cleanser 3. Primary Dressing Applied Collegen AG Hydrogel or K-Y Jelly 4. Secondary Dressing ABD Pad Dry Gauze Roll Gauze 5. Secured With Tape 7. Footwear/Offloading device applied Felt/Foam Notes saline moisten gauze backing the prisma. Electronic Signature(s) Signed: 08/01/2019 6:19:43 PM By: Linton Ham MD Signed: 08/01/2019 7:05:19 PM By: Baruch Gouty RN, BSN Entered By: Linton Ham on 08/01/2019 18:03:22 -------------------------------------------------------------------------------- Multi-Disciplinary Care Plan Details Patient Name: Date of Service: [name] [name], [name] [name] 08/01/2019 3:30 PM Medical Record [id number] Patient Account Number: [id number] Date of Birth/Sex: Treating RN: Jun 28, 1956 (63 y.o. [name] [name] Primary  Care Sae Handrich: [name] [name], [name] [name] Other Clinician: Referring Umi Mainor: Treating Kwali Wrinkle/Extender:[name] [name] in Treatment: 50 Active Inactive Abuse / Safety / Falls / Self Care Management Nursing Diagnoses: Potential for injury related to falls Goals: Patient/caregiver will verbalize understanding of skin care regimen Date Initiated: 11/12/2018 Target Resolution Date: 08/29/2019 Goal Status: Active Interventions: Assess Activities of Daily Living upon admission and as needed Assess fall risk on admission and as needed Assess: immobility, friction, shearing, incontinence upon admission and as needed Assess impairment of mobility on admission and as needed per policy Assess personal safety and home safety (as indicated) on admission and as needed Assess self care needs on admission and as needed Notes: Wound/Skin Impairment Nursing Diagnoses: Knowledge deficit related to ulceration/compromised skin integrity Goals: Patient/caregiver will verbalize understanding of skin care regimen Date Initiated: 04/11/2019 Target Resolution Date: 08/15/2019 Goal Status: Active Ulcer/skin breakdown will have a volume reduction of 30% by week 4 Date Initiated: 11/12/2018 Date Inactivated: 01/17/2019 Target Resolution Date: 01/11/2019 Goal Status: Met Ulcer/skin breakdown will have a volume reduction of 50% by week 8 Date Initiated: 01/17/2019 Date Inactivated: 02/14/2019 Target Resolution Date: 02/14/2019 Goal Status: Met Ulcer/skin breakdown will have a volume reduction of 80% by week 12 Date Initiated: 02/14/2019 Date Inactivated: 03/21/2019 Target Resolution Date: 03/14/2019 Unmet Reason: infection in Goal Status: Unmet wound Interventions: Assess patient/caregiver ability to obtain necessary supplies Assess patient/caregiver ability to perform ulcer/skin  care regimen upon admission and as needed Assess ulceration(s) every visit Notes: Electronic  Signature(s) Signed: 08/01/2019 7:05:19 PM By: Baruch Gouty RN, BSN Entered By: Baruch Gouty on 08/01/2019 17:57:40 -------------------------------------------------------------------------------- Pain Assessment Details Patient Name: Date of Service: [name] [name], [name] [name] 08/01/2019 3:30 PM Medical Record [id number] Patient Account Number: [id number] Date of Birth/Sex: Treating RN: [birth year] (63 y.o. [name] [name] Primary Care Suriya Kovarik: [name] [name] Other Clinician: Referring Shep Porter: Treating Yared Susan/Extender:[name] [name] in Treatment: 37 Active Problems Location of Pain Severity and Description of Pain Patient Has Paino No Site Locations Pain Management and Medication Current Pain Management: Electronic Signature(s) Signed: 08/04/2019 6:17:12 PM By: [name] Hurst RN, BSN Entered By: [name] [name] on 08/01/2019 15:44:30 -------------------------------------------------------------------------------- Patient/Caregiver Education Details Patient Name: Date of Service: [name] [name] 10/2/2020andnbsp3:30 PM Medical Record Patient Account Number: [id number] Number: Treating RN: Baruch Gouty Date of Birth/Gender: Dec 24, 1955 (63 y.o. F) Other Clinician: Primary Care Physician: [name] [name], Antionette Poles Referring Physician: Physician/Extender: Ike Bene in Treatment: 54 Education Assessment Education Provided To: Patient Education Topics Provided Infection: Methods: Explain/Verbal Responses: Reinforcements needed, State content correctly Offloading: Methods: Explain/Verbal Responses: Reinforcements needed, State content correctly Wound/Skin Impairment: Methods: Explain/Verbal Responses: Reinforcements needed, State content correctly Electronic Signature(s) Signed: 08/01/2019 7:05:19 PM By: Baruch Gouty RN, BSN Entered By: Baruch Gouty on 08/01/2019  17:58:36 -------------------------------------------------------------------------------- Wound Assessment Details Patient Name: Date of Service: [name] [name] 08/01/2019 3:30 PM Medical Record [id number] Patient Account Number: [id number] Date of Birth/Sex: Treating RN: Feb 27, 1956 (63 y.o. [name] [name] Primary Care Adalberto Metzgar: [name] [name], [name] [name] Other Clinician: Referring Shoaib Siefker: Treating Royce Stegman/[name] [name], [name] [name], [name] [name] in Treatment: 37 Wound Status Wound Number: 3 Primary Diabetic Wound/Ulcer of the Lower Extremity Etiology: Wound Location: Left Calcaneus Wound Open Wounding Event: Trauma Status: Date Acquired: 11/12/2016 Comorbid Cataracts, Anemia, Coronary Artery Disease, Weeks Of Treatment: 37 History: Hypertension, Myocardial Infarction, Type II Clustered Wound: No Diabetes, Osteoarthritis, Osteomyelitis, Neuropathy Photos Wound Measurements Length: (cm) 2.7 Width: (cm) 2.2 Depth: (cm) 0.7 Area: (cm) 4.665 Volume: (cm) 3.266 Wound Description Classification: Grade 2 Wound Margin: Flat and Intact Exudate Amount: Medium Exudate Type: Serosanguineous Exudate Color: red, brown Wound Bed Granulation Amount: Medium (34-66%) Granulation Quality: Pink Necrotic Amount: Medium (34-66%) Necrotic Quality: Adherent Slough After Cleansing: No brino Yes Exposed Structure osed: No (Subcutaneous Tissue) Exposed: Yes osed: No osed: No sed: No ed: No % Reduction in Area: 12.9% % Reduction in Volume: 49.2% Epithelialization: None Tunneling: No Undermining: No Foul Odor Slough/Fi Fascia Exp Fat Layer Tendon Exp Muscle Exp Joint Expo Bone Expos Treatment Notes Wound #3 (Left Calcaneus) 1. Cleanse With Wound Cleanser 3. Primary Dressing Applied Collegen AG Hydrogel or K-Y Jelly 4. Secondary Dressing ABD Pad Dry Gauze Roll Gauze 5. Secured With Tape 7. Footwear/Offloading device applied Felt/Foam Notes saline  moisten gauze backing the prisma. Electronic Signature(s) Signed: 08/04/2019 4:14:10 PM By: Mikeal Hawthorne EMT/HBOT Signed: 08/04/2019 6:17:12 PM By: [name] Hurst RN, BSN Entered By: Mikeal Hawthorne on 08/04/2019 10:12:19 -------------------------------------------------------------------------------- Wound Assessment Details Patient Name: Date of Service: [name] [name], [name] [name] 08/01/2019 3:30 PM Medical Record [id number] Patient Account Number: [id number] Date of Birth/Sex: Treating RN: Jan 11, 1956 (63 y.o. [name] [name] Primary Care Alix Lahmann: [name] [name] Other Clinician: Referring Shironda Kain: Treating Janautica Netzley/[name] [name], [name] [name], YVONNE Weeks in Treatment: 37 Wound Status Wound Number: 6 Primary Diabetic Wound/Ulcer of the Lower Extremity Etiology: Wound Location: Left Toe Great Wound Open Wounding Event: Blister Status: Date Acquired: 06/19/2019 Comorbid Cataracts, Anemia,  Coronary Artery Disease, Weeks Of Treatment: 6 History: Hypertension, Myocardial Infarction, Type II Clustered Wound: No Diabetes, Osteoarthritis, Osteomyelitis, Neuropathy Photos Wound Measurements Length: (cm) 0.3 % Reduction in Ar Width: (cm) 0.2 % Reduction in Vo Depth: (cm) 0.2 Epithelialization Area: (cm) 0.047 Tunneling: Volume: (cm) 0.009 Undermining: Starting Posit Ending Positio Maximum Distan ea: -487.5% lume: -800% : Small (1-33%) No Yes ion (o'clock): 6 n (o'clock): 12 ce: (cm) 0.2 Wound Description Classification: Grade 2 Foul Odor After Wound Margin: Flat and Intact Slough/Fibrino Exudate Amount: Small Exudate Type: Serosanguineous Exudate Color: red, brown Wound Bed Granulation Amount: Large (67-100%) Granulation Quality: Pink Fascia Exposed: Necrotic Amount: None Present (0%) Fat Layer (Subcut Tendon Exposed: Muscle Expo Joint Expos Bone Expose Cleansing: No Yes Exposed Structure No aneous Tissue) Exposed: Yes No sed: No ed:  No d: No Treatment Notes Wound #6 (Left Toe Great) 1. Cleanse With Wound Cleanser 3. Primary Dressing Applied Collegen AG Hydrogel or K-Y Jelly 4. Secondary Dressing Dry Gauze Roll Gauze 5. Secured With Tape 7. Footwear/Offloading device applied Felt/Foam Electronic Signature(s) Signed: 08/04/2019 4:14:10 PM By: Mikeal Hawthorne EMT/HBOT Signed: 08/04/2019 6:17:12 PM By: [name] Hurst RN, BSN Entered By: Mikeal Hawthorne on 08/04/2019 10:12:42 -------------------------------------------------------------------------------- Vitals Details Patient Name: Date of Service: [name] [name], [name] [name] 08/01/2019 3:30 PM Medical Record [id number] Patient Account Number: [id number] Date of Birth/Sex: Treating RN: [birth year] (63 y.o. [name] [name] Primary Care Stanton Kissoon: [name] [name], [name] [name] Other Clinician: Referring Lebaron Bautch: Treating Ceasia Elwell/[name] [name], [name] [name], [name] [name] in Treatment: 37 Vital Signs Time Taken: 15:44 Temperature (F): 97.9 Height (in): 62 Pulse (bpm): 78 Weight (lbs): 180 Respiratory Rate (breaths/min): 18 Body Mass Index (BMI): 32.9 Blood Pressure (mmHg): 145/71 Reference Range: 80 - 120 mg / dl Electronic Signature(s) Signed: 08/04/2019 6:17:12 PM By: [name] Hurst RN, BSN Entered By: [name] [name] on 08/01/2019 15:44:20

## 2019-08-05 ENCOUNTER — Other Ambulatory Visit: Payer: Self-pay

## 2019-08-05 ENCOUNTER — Ambulatory Visit (INDEPENDENT_AMBULATORY_CARE_PROVIDER_SITE_OTHER): Payer: PRIVATE HEALTH INSURANCE | Admitting: Internal Medicine

## 2019-08-05 DIAGNOSIS — M00021 Staphylococcal arthritis, right elbow: Secondary | ICD-10-CM | POA: Diagnosis not present

## 2019-08-05 NOTE — Progress Notes (Signed)
[facility]  Patient Active Problem List   Diagnosis Date Noted  . Septic arthritis of elbow, right ([place]) 04/17/2019    Priority: High  . Pressure injury of skin 04/17/2019  . 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 Acute on chronic renal insufficiency 04/15/2019  . Sleep apnea 11/16/2017  . Diabetic foot infection ([place]) 04/06/2017  . Type 2 diabetes mellitus with hyperglycemia, with long-term current use of insulin ([place]) 04/06/2017  . Ulcer of left heel, limited to breakdown of skin ([place]) 02/01/2017  . Osteomyelitis of ankle and foot ([place])   . Neuropathy 05/05/2015  . Diabetic peripheral neuropathy ([place]) 01/12/2015  . Infection associated with orthopedic device ([place]) 12/11/2014  . CKD (chronic kidney disease), stage III ([place]) 05/27/2014  . History of MI (myocardial infarction) 10/06/2013  . Obesity (BMI 30-39.9) 09/20/2013  . 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 Hyponatremia 02/03/2013  . Normocytic anemia 02/03/2013  . CAD (coronary artery disease) 01/11/2012  . UTI 08/05/2010  . Hepatic cirrhosis ([place]) 07/06/2010  . 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  . Hyperlipidemia 06/03/2008  . Essential hypertension 12/23/2006    Patient's Medications  New Prescriptions   No medications on file  Previous Medications   AMINO ACIDS-PROTEIN HYDROLYS (FEEDING SUPPLEMENT, PRO-STAT SUGAR FREE 64,) LIQD    Take 30 mLs by mouth 2 (two) times daily.   ASPIRIN EC 81 MG EC TABLET    Take 1 tablet (81 mg total) by mouth 2 (two) times daily.   ATORVASTATIN (LIPITOR) 10 MG TABLET    Take 1 tablet by mouth once daily   CALCIUM ALGINATE EX    Apply 1 patch topically every other day.   CYCLOBENZAPRINE (FLEXERIL) 10 MG TABLET    Take 1 tablet (10 mg total) by mouth 3 (three) times daily as needed for up to 15 doses for muscle spasms.   DOCUSATE SODIUM (COLACE) 100 MG CAPSULE    Take 100 mg by mouth daily.   DULOXETINE (CYMBALTA) 60 MG  CAPSULE    Take 1 capsule by mouth once daily   ENSURE MAX PROTEIN (ENSURE MAX PROTEIN) LIQD    Take 330 mLs (11 oz total) by mouth 2 (two) times daily.   FERROUS SULFATE (IRON) 325 (65 FE) MG TABS    Take 1 tablet by mouth daily.    FLUTICASONE (FLONASE) 50 MCG/ACT NASAL SPRAY    Place 2 sprays into both nostrils daily.   GUAIFENESIN (MUCINEX) 600 MG 12 HR TABLET    Take 1 tablet (600 mg total) by mouth 2 (two) times daily.   HYDROCHLOROTHIAZIDE (HYDRODIURIL) 25 MG TABLET    Take 1 tablet (25 mg total) by mouth daily. RESUME AFTER EVALUATED BY PCP   HYDROCODONE-ACETAMINOPHEN (NORCO/VICODIN) 5-325 MG TABLET    Take 1 tablet by mouth every 6 (six) hours as needed for moderate pain.   INSULIN GLARGINE (LANTUS SOLOSTAR) 100 UNIT/ML SOLOSTAR PEN    INJECT 20 UNITS SUBCUTANEOUSLY ONCE DAILY IN THE MORNING AND THEN INJECT 40 UNITS AT BEDTIME   INSULIN LISPRO (HUMALOG KWIKPEN) 200 UNIT/ML SOPN    Inject 13 Units into the skin daily.   MULTIPLE VITAMINS-MINERALS (MULTIVITAMIN GUMMIES WOMENS) CHEW    Chew 2 each by mouth daily.   NUTRITIONAL SUPPLEMENTS (FEEDING SUPPLEMENT, NEPRO CARB STEADY,) LIQD    Take 237 mLs by mouth 3 (three) times daily as needed (Supplement).   NYSTATIN (MYCOSTATIN/NYSTOP) POWDER    Apply topically 3 (three) times  daily.   ONDANSETRON (ZOFRAN ODT) 4 MG DISINTEGRATING TABLET    Take 1 tablet (4 mg total) by mouth every 8 (eight) hours as needed for nausea or vomiting.   POLYETHYLENE GLYCOL (MIRALAX / GLYCOLAX) PACKET    Take 17 g by mouth daily as needed for mild constipation.    PREGABALIN (LYRICA) 150 MG CAPSULE    Take 1 capsule (150 mg total) by mouth 2 (two) times daily.  Modified Medications   No medications on file  Discontinued Medications   CLINDAMYCIN (CLEOCIN) 300 MG CAPSULE    Take 1 capsule (300 mg total) by mouth 3 (three) times daily.    Subjective: [name] [name] is in for [name] [name] hospital follow-up visit.  [name] [name] is a 63 y.o. female with multiple medical problems including  poorly controlled diabetes.  In January 2016 [name] [name] fell and suffered a fracture dislocation of the head of [name] [name] right radius.  [name] [name] underwent arthroplasty but developed early postoperative infection due to methicillin sensitive coagulase-negative staph.  [name] [name] had incision and drainage with hardware removal and placement of an external fixator.  [name] [name] was treated with IV antibiotics.  Based on notes from [facility] it appears that [name] [name] had a serious allergic reaction to cefazolin and completed 6 weeks of therapy with clindamycin.  [name] [name] recently began having fever, chills and increasing right elbow pain leading to admission on 04/15/2019. [name] [name] temperature was 100.9 degrees.  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 Associated posttraumatic degenerative changes at the joint".  Arthrocentesis yielded 10 cc of pus.  The specimen clotted so no white blood cell count was performed but there was a predominance of segmented neutrophils.  No crystals were seen.  Gram stain of synovial fluid shows gram-positive cocci and cultures grew MRSA. [name] [name] underwent incision and drainage.  I elected to treat [name] [name] with 2 weeks of IV vancomycin and then have [name] [name] transition to oral doxycycline.  When I saw [name] [name] in July [name] [name] still had quite a bit of redness and swelling.  I changed [name] [name] to oral clindamycin.  Unfortunately, [name] [name] is missed 2 follow-up appointments since that time.  [name] [name] has been on clindamycin nearly continuously but is now having much more drainage from [name] [name] right elbow.  [name] [name] is having to change [name] [name] dressing twice daily.  [name] [name] is having more pain in [name] [name] right arm.  [name] [name] is seen [name] [name] orthopedic surgeon, Dr. Malena Catholic, who has scheduled [name] [name] for an MRI next week.  Review of Systems: Review of Systems  Constitutional: Negative for chills, diaphoresis and fever.  Gastrointestinal: Negative for abdominal pain, diarrhea, nausea and vomiting.   Musculoskeletal: Positive for joint pain.    Past Medical History:  Diagnosis Date  . Acute MI [facility]) 2007   presented to ED & had cardiac cath- but found to have normal coronaries. Since that point in time [name] [name] PCP cares f or cardiac needs. Dr. [name] - [facility]  . Anemia   . Anginal pain ([place])   . Anxiety   . Asthma   . Bulging lumbar disc   . Cataract   . 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 Cirrhosis of liver without mention of alcohol   . Constipation   . Dehiscence of closure of skin    left partial calcaneal excision  . Depression   . Diabetes mellitus    insulin dependent,  adult onset  . Episode of visual loss of left eye   . Exertional shortness of breath   . Fatty liver   . Fibromyalgia   . GERD (gastroesophageal reflux disease)   . Hepatic steatosis   . High cholesterol   . Hypertension   . MRSA (methicillin resistant Staphylococcus aureus)   . Neuropathy    lower legs  . Osteoarthritis    hands, hips  . Proximal humerus fracture 10/15/12   Left  . PTSD (post-traumatic stress disorder)   . Renal insufficiency 05/05/2015    Social History   Tobacco Use  . Smoking status: Never Smoker  . Smokeless tobacco: Never Used  Substance Use Topics  . Alcohol use: No    Alcohol/week: 0.0 standard drinks  . Drug use: No    Family History  Problem Relation Age of Onset  . Heart disease Father   . Diabetes Father   . Colitis Father   . Crohn's disease Father   . Cancer Father        leukemia  . Leukemia Father   . Diabetes Mellitus II Brother   . Kidney disease Brother   . Heart disease Brother   . Diabetes Mother   . Hypertension Mother   . Mental illness Mother   . Irritable bowel syndrome Daughter   . Diabetes Mellitus II Brother   . Kidney disease Brother   . Liver disease Brother   . Kidney disease Brother   . Heart attack Brother   . Diabetes Mellitus II Brother   . Heart  disease Brother   . Liver disease Brother   . Kidney disease Brother   . Kidney disease Brother   . Diabetes Mellitus II Brother   . Diabetes Mellitus I Brother     Allergies  Allergen Reactions  . Bee Pollen Anaphylaxis  . Fish-Derived Products Hives, Shortness Of Breath, Swelling and Rash    Hives get in throat causing trouble breathing  . Mushroom Extract Complex Anaphylaxis  . Penicillins Anaphylaxis    Did it involve swelling of the face/tongue/throat, SOB, or low BP? Yes Did it involve sudden or severe rash/hives, skin peeling, or any reaction on the inside of your mouth or nose? No Did you need to seek medical attention at a hospital or doctor's office? Yes When did it last happen?A few months ago If all above answers are "NO", may proceed with cephalosporin use.  [name] Kitchen Rosemary Oil Anaphylaxis  . Shellfish Allergy Hives, Shortness Of Breath, Swelling and Rash  . Tomato Hives and Shortness Of Breath    Hives in throat causes [name] [name] trouble breathing  . Acetaminophen Other (See Comments)    GI upset  . Acyclovir And Related Other (See Comments)    Unknown rxn  . Aloe Vera Hives  . Broccoli [Brassica Oleracea Italica] Hives  . Naproxen Other (See Comments)    Unknown rxn    Objective: Vitals:   08/05/19 1546  BP: (!) 144/84  Pulse: 76  Temp: 98 F (36.7 C)   There is no height or weight on file to calculate BMI.  Physical Exam Constitutional:      Comments: [name] [name] is pleasant and in good spirits.  Musculoskeletal:     Comments: [name] [name] has an open, draining sinus tract.  The drainage is serous and yellow.  There is no malodor.    Photo above taken on 05/19/2019   Above taken 05/29/2019   Taken today   Lab Results Sed Rate  Date Value  05/19/2019 84 mm/h (H)  04/16/2019 126 mm/hr (H)  11/26/2018 32 mm/hr (H)   CRP  Date Value  05/19/2019 9.2 mg/L (H)  04/16/2019 32.3 mg/dL (H)  04/06/2017 13.4 mg/dL (H)     Problem List Items Addressed This Visit       High   Septic arthritis of elbow, right ([place])    I strongly suspect underlying chronic osteomyelitis.  We will have [name] [name] stop clindamycin now and follow-up after [name] [name] MRI next week.          Michel Bickers, MD [facility] [phone] pager   [phone] cell 08/05/2019, 4:04 PM

## 2019-08-05 NOTE — Assessment & Plan Note (Signed)
I strongly suspect underlying chronic osteomyelitis.  We will have her stop clindamycin now and follow-up after her MRI next week.

## 2019-08-06 ENCOUNTER — Encounter (HOSPITAL_COMMUNITY): Payer: PRIVATE HEALTH INSURANCE

## 2019-08-08 ENCOUNTER — Other Ambulatory Visit: Payer: Self-pay

## 2019-08-08 ENCOUNTER — Telehealth: Payer: Self-pay | Admitting: Internal Medicine

## 2019-08-08 ENCOUNTER — Encounter (HOSPITAL_BASED_OUTPATIENT_CLINIC_OR_DEPARTMENT_OTHER): Payer: PRIVATE HEALTH INSURANCE | Admitting: Internal Medicine

## 2019-08-08 DIAGNOSIS — E11621 Type 2 diabetes mellitus with foot ulcer: Secondary | ICD-10-CM | POA: Diagnosis not present

## 2019-08-11 ENCOUNTER — Ambulatory Visit (HOSPITAL_COMMUNITY): Admission: RE | Admit: 2019-08-11 | Payer: PRIVATE HEALTH INSURANCE | Source: Ambulatory Visit

## 2019-08-11 ENCOUNTER — Encounter (HOSPITAL_COMMUNITY): Payer: Self-pay

## 2019-08-11 ENCOUNTER — Ambulatory Visit (HOSPITAL_COMMUNITY): Payer: PRIVATE HEALTH INSURANCE

## 2019-08-12 NOTE — Progress Notes (Signed)
RYIAH, BELLISSIMO ([id number]) Visit Report for 08/08/2019 HPI Details Patient Name: Date of Service: [name] [name], [name] [name] 08/08/2019 3:15 PM Medical Record [id number] Patient Account Number: [id number] Date of Birth/Sex: Treating RN: 1956-04-15 (63 y.o. [name] Primary Care Provider: [name] Other Clinician: Referring Provider: Treating Provider/Extender:Robson, Enedina Finner Weeks in Treatment: 65 History of Present Illness HPI Description: ADMISSION 11/12/2018 This is a 63 year old woman with type 2 diabetes and diabetic neuropathy. She has been dealing with a left heel plantar wound for roughly 2 years. She states this started when she pulled some skin off the area and it progressed into a wound. She also has an area on the tip of her left great toe for 1 year. She has been largely followed by Dr. Sharol Given and she had an excision of bone in the left heel in 2017. I do not see microbiology from this excision or pathology. Apparently this wound never really closed. She most recently has been using Silvadene cream. Offloading this with a scooter. The last MRI I see was in June 2018 which did not show osteomyelitis at that time. Apparently this wound is never really progressed towards healing. During her last review by Dr. Sharol Given in October it was recommended that she undergo a left BKA and she refused. She went to see a second orthopedic consult at [facility] who recommended conservative wound care to see if this will close or progress towards closure but also warned that possible surgery may be necessary. An x-ray that was done at [facility] showed an irregular calcaneal body and calcaneal tuberosity. This is probably postprocedural. Previous x-rays at [facility] had suggested heterotrophic calcifications but I do not see this. Apparently a second ointment was added to the Silvadene which the patient thinks is because some improvement. She has not been systemically  unwell. No fever or chills. She thinks the second ointment that was given to her at [facility] has helped somewhat. 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 ABIs done in June 2018 showed a ABI of 1.12 on the right and 1.1 on the left she was biphasic bilaterally. ABIs in our clinic were noncompressible today. 11/20/18 on evaluation today patient is seen for her second visit here in the office although this is actually the first visit with me concerning an issue that she's having with her great toe and heel of the left foot. Fortunately she does not appear to be have any discomfort at this time and she does have a scooter in order to offload her foot she also has a boot for offloading. With that being said this is something that appears to have been going on for some time she was seeing Dr. due to his recommendation was that she was going to require a below knee amputation. With that being said she wanted to come to the wound center but according to the patient Dr. Sharol Given told her that "we could not help her". Nonetheless she saw another provider who suggested that it may be worth a shot for [name] to try and help her out if it all possible. Nonetheless she decided that it would be worth a trial since otherwise any the way she's gonna  end up with an amputation. Obviously if we can heal the wound then that will not be the case. Again I explained to her that obviously there are no guarantees but we will give this a good try and attempts to get the wound to heal. 1/30; the patient continues to have areas on the left plantar heel and the left plantar great toe. The more worrisome area is the heel. She went for  MRI on Saturday but this could not be done because she had silver alginate in the wound bed. I would like to get this rebooked. If she does not have osteomyelitis she will need a total contact cast. We have been using silver alginate in the wounds 2/7; patient has wounds on her left plantar heel and left plantar great toe. The area on the heel has some depth although it looks about the same today. Her MRI will finally be done tomorrow. If she has osteomyelitis in the heel and then we will need to consider her for IV antibiotics and hyperbaric oxygen. If the MRI is negative she will need a total contact cast although she is wearing a cam boot and using a scooter at present. Will be using silver alginate. She will take it off tomorrow in preparation for the MRI 2/14; the patient's MRI showed extensive surgical changes with a large portion of her calcaneus removed on the left secondary to her previous surgery by Dr. Sharol Given however there is no evidence of osteomyelitis. She would therefore be a candidate for a total contact cast and we applied this for the first time today 2/21; silver collagen total contact cast. The area on the plantar left great toe is "healed" still a lot of callus on this area. Dimensions on the plantar heel not too much different some epithelialization is present however. This is an improvement 12/25/18 on evaluation today patient actually is seen for follow-up concerning issues that she has been having with her cast she states she feels like it's wet and squishy in the bottom of her cast. The reason she does come in to have this evaluated and see what is going on. Fortunately there's no signs of infection at this time was the cast was removed. 3/6; the patient's area on the tip of her right great toe is callused but there is no open area here. She still has the fairly extensive area in the left heel. We have been using silver collagen in the wound. 01/08/19 on evaluation today  patient actually appears to be doing very well in regard to her heel ulcer in my pinion to see if you shown signs of improvement which is excellent news. There's no evidence of infection. She continues to have quite a bit of drainage but fortunately again this doesn't seem to be hindering her healing. 3/20 -Patient's foot ulcer on the left appears to be doing well, the dimensions appear encouraging, we have been using total contact cast with Prisma and will continue doing that 3/27; patient continues to make nice progress on the left plantar heel. She is using silver collagen under a total contact cast. It is been a while since I have seen this wound and it really looks a lot better. Smaller with healthy granulation 4/3; she continues to make nice progress on the left plantar heel. Using silver collagen under a total contact cast 4/10; left plantar heel. Again skin over the surface of the wound with not much in the way of adherence. This results in undermining. Using  silver collagen changed to silver alginate 4/17 left plantar heel. Again not much improvement. There is no undermining today no debridement was required we used silver alginate last time because of excessive moisture 4/24 left plantar heel. Again not as much improvement as I would have liked. About 3 mm of depth. Thick callused skin around the circumference. Using silver alginate 5/1; left plantar heel this is improved this week. Less depth epithelialization is present. Using silver alginate alginate under a total contact cast 5/8; left plantar heel dimensions are about the same. The depth appears to be improved. I use silver collagen starting today under the cast 5/15 left plantar heel. Arrives today with a 2-day history of feeling like something had "slipped" while walking her dog in the cast. Unfortunately extensive area relatively to the small wound of undermining superiorly denuded epithelium. 5/22-Patient returns at 1 week  after being taken off the TCC on account of some fluid collection that was debrided and cultured at last visit from the plantar ulcer on the left heel. The culture results are polymicrobial with Klebsiella, enterococcus, Proteus growth these organisms are sensitive to Cipro except with enterococcus which is sensitive to ampicillin but patient is highly allergic to penicillin according to her. 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 5/29; the patient arrived last week with a new wound on her left plantar great toe. With regards to the culture that I did 2 weeks ago of her deteriorating heel wound this grew Klebsiella and enterococcus. She was given Cipro however the enterococcus would not be covered well by a quinolone. She is allergic to penicillin. I will give her linezolid 600 twice daily x5 days 6/12; the patient continues to have a wound on her left and after she returns to the beach there is undermining laterally. She has the new wound from this 2 weeks or so ago on the plantar tip of her left great toe. She had a fall today scraping the dorsal surface of the left fifth 7/14; READMISSION since the patient was last here she was hospitalized from 04/15/2019 through 04/22/2019. She had presented to an outside ER after falling and hitting her elbow. She had had previous surgery on the elbow and fractures several years ago. An x-ray was negative and she was sent home. She is readmitted with sepsis and acute renal failure secondary to septic arthritis of the elbow. Cultures apparently showed pansensitive staph aureus however she has a severe beta-lactam allergy. She was treated with vancomycin. Apparently the vancomycin is completed and her PICC line is removed although she is seeing Dr. Megan Salon tomorrow due to continued pain and swelling. In the hospital she had an IandD by Dr. [name] of orthopedics. The  original surgery was on 04/17/2019 and it was felt that she had septic arthritis. She is still having a lot of pain and swelling in the right elbow and apparently is due to see Dr. Megan Salon tomorrow and what she thinks is that she will be restarted on antibiotics. With regards to her heel wounds/left foot wounds. Her arterial studies were checked and her ABIs were within normal limits. X-ray showed plantar foot ulcer negative for osteomyelitis postop resection of the posterior calcaneus. She has been using silver alginate to the heel 8/4-Patient returns after being seen on 7/14, we are using silver alginate to the calcaneal wound, she is continuing to receive care for her right elbow septic arthritis  8/14- Patient returns after 1 week, she is being seen by the surgical group for her right elbow, she is here for the left calcaneal wound for which we are using silver alginate this is about the same 8/21; this is a patient I readmitted to the clinic 5 weeks ago. She is continuing to have difficulties with a septic arthritis of the right elbow she had after a fall and apparently has had surgical IandD's since the last time we saw her. She is also following with Dr. Megan Salon of infectious disease and is apparently on 3 oral antibiotics although at the time of this dictation I am not sure what they are. She comes in today with a necrotic surface on the left great toe with a blister laterally. This was still clearly an open wound. She had purulent drainage coming out of this. The original wound on the plantar aspect of her right heel in the setting of a Charcot foot is deep not open to bone but certainly not any better at all. We have been using silver alginate 8/28; dealing with septic arthritis of the right elbow. May need to go for further surgery here in the second week of September. She had a blister on the left great toe that was purulent [place] draining last week although a culture did not grow  anything [already on antibiotics through infectious disease for her elbow]. The punched-out area on her heel is just like it was when she first came in. This almost closed with a total contact cast there are no options for that now. The patient states she cannot stay off her foot having to do housework X-ray of the foot showed a moderate bunion and severe degenerative changes at the first metatarsal phalangeal joint there was a moderate plantar calcaneal spur. We will need to check the location of this. 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 It is oral. We are using silver collagen to the punched-out area on her left heel and to the summer superficial area of the left great toe. She is offloading this is best she can although judging by the amount of callus it is not enough. She thinks she has enough strength in her right elbow now to use her scooter. 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 Hale Bogus notes and infectious disease. We are using silver collagen to the punched-out areas on her left heel and to the plantar aspect of her left great toe she is now using her scooter to get around and to help offload these areas 10/2; 2-week follow-up. Still on clindamycin I believe for the left elbow she is going for an MRI of the elbow over the weekend. She is then going to see orthopedics and infectious disease. The area on the left heel is certainly no better. This does not probe to bone however there is green drainage. She has an area on the tip of her toe. The area on the heel is in a wound we almost closed at one point with total contact casting. 10/9; one-week follow-up.  Culture I did of the heel last week which was a swab culture admittedly  showed moderate Enterococcus faecalis moderate Pseudomonas. The wound itself looks about the same. There is no palpable bone although the depth of it closely approximates bone. The heel is swollen but not erythematous. Her MRI is booked for next week. She also has an MRI of the right elbow. I've also lifted the last consult from Dr. Megan Salon who is following her for septic arthritis of the right elbow. I did not see him specifically comment on her left heel however the patient states that he is aware of this. I did not specifically address the organisms I cultured last week because of the possible effect of any additional cultures that will be done on the elbow. Additionally these were superficial not bone cultures Electronic Signature(s) Signed: 08/10/2019 10:27:00 AM By: Linton Ham MD Entered By: Linton Ham on 08/10/2019 09:53:41 -------------------------------------------------------------------------------- Physical Exam Details Patient Name: Date of Service: [name] [name], [name] [name] 08/08/2019 3:15 PM Medical Record [id number] Patient Account Number: [id number] Date of Birth/Sex: Treating RN: 07/22/1956 (63 y.o. [name] Primary Care Provider: [name] Other Clinician: Referring Provider: Treating Provider/Extender:Robson, Lennie Odor CHASE, Tora Kindred in Treatment: 64 Constitutional Patient is hypertensive.. Pulse regular and within target range for patient.[name] Kitchen Respirations regular, non-labored and within target range.. Temperature is normal and within the target range for the patient.. patient does not look to be systemically unwell. Eyes Conjunctivae clear. No discharge.no icterus. Respiratory work of breathing is normal. Cardiovascular . pedal pulses are palpable on the left. Musculoskeletal right elbow is wrapped I did not disturb this. there is swelling of the left heel  but no erythema. The wound is the approximate bone but there is no palpable bone. Integumentary (Hair, Skin) Skin and subcutaneous tissue without rashes, lesions. Skin and subcutaneous tissue without induration or subcutaneous nodules.[name] Kitchen Psychiatric appears at normal baseline. Notes wound exam; still are concerning deep punched area on the plantar left heel. There is no erythema here the base of this appears dusky. I don't think this is changed significantly from last week Electronic Signature(s) Signed: 08/10/2019 10:27:00 AM By: Linton Ham MD Entered By: Linton Ham on 08/10/2019 10:02:31 -------------------------------------------------------------------------------- Physician Orders Details Patient Name: Date of Service: [name] [name], [name] [name] 08/08/2019 3:15 PM Medical Record [id number] Patient Account Number: [id number] Date of Birth/Sex: Treating RN: [birth year] (63 y.o. 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 Dressing Change Frequency Wound #3 Left Calcaneus Change Dressing every other day. Wound #6 Left Toe Great Change Dressing every other day. Wound Cleansing Wound #3 Left Calcaneus May shower and wash wound with soap and water. Wound #6 Left Toe Great May shower and wash wound with soap and water. Primary Wound Dressing Wound #3 Left Calcaneus Silver Collagen - to line wound bed, use saline moistened gauze to fill space behind collagen Wound #6 Left Toe Great Silver  Collagen - moisten with saline Secondary Dressing Wound #3 Left Calcaneus Kerlix/Rolled Gauze Dry Gauze Other: - felt callous pad Wound #6 Left Toe Great Kerlix/Rolled Gauze Dry Gauze Other: - felt callous pad or foam donut Edema Control Elevate legs to the level of the heart or above for 30  minutes daily and/or when sitting, a frequency of: - throughout the day Off-Loading Other: - use knee scooter or motorized chair , minimal weight bearing left foot Electronic Signature(s) Signed: 08/10/2019 10:27:00 AM By: Linton Ham MD Signed: 08/12/2019 5:52:09 PM By: [name] RN, BSN Entered By: [name] on 08/08/2019 16:17:45 -------------------------------------------------------------------------------- Problem List Details Patient Name: Date of Service: [name] [name], [name] [name] 08/08/2019 3:15 PM Medical Record [id number] Patient Account Number: [id number] Date of Birth/Sex: Treating RN: January 03, 1956 (63 y.o. [name] Primary Care Provider: Carollee Herter, Kendrick Fries Other Clinician: Referring Provider: Treating Provider/Extender:Robson, Enedina Finner Weeks in Treatment: 34 Active Problems ICD-10 Evaluated Encounter Code Description Active Date Today Diagnosis L97.423 Non-pressure chronic ulcer of left heel and midfoot 11/12/2018 No Yes with necrosis of muscle L97.522 Non-pressure chronic ulcer of other part of left foot 11/12/2018 No Yes with fat layer exposed E11.621 Type 2 diabetes mellitus with foot ulcer 11/12/2018 No Yes Inactive Problems Resolved Problems Electronic Signature(s) Signed: 08/10/2019 10:27:00 AM By: Linton Ham MD Entered By: Linton Ham on 08/10/2019 09:50:32 -------------------------------------------------------------------------------- Progress Note Details Patient Name: Date of Service: [name] [name] 08/08/2019 3:15 PM Medical Record [id number] Patient Account Number: [id number] Date of  Birth/Sex: Treating RN: April 07, 1956 (63 y.o. [name] Primary Care Provider: [name] Other Clinician: Referring Provider: Treating Provider/Extender:Robson, Enedina Finner Weeks in Treatment: 51 Subjective History of Present Illness (HPI) ADMISSION 11/12/2018 This is a 63 year old woman with type 2 diabetes and diabetic neuropathy. She has been dealing with a left heel plantar wound for roughly 2 years. She states this started when she pulled some skin off the area and it progressed into a wound. She also has an area on the tip of her left great toe for 1 year. She has been largely followed by Dr. Sharol Given and she had an excision of bone in the left heel in 2017. I do not see microbiology from this excision or pathology. Apparently this wound never really closed. She most recently has been using Silvadene cream. Offloading this with a scooter. The last MRI I see was in June 2018 which did not show osteomyelitis at that time. Apparently this wound is never really progressed towards healing. During her last review by Dr. Sharol Given in October it was recommended that she undergo a left BKA and she refused. She went to see a second orthopedic consult at [facility] who recommended conservative wound care to see if this will close or progress towards closure but also warned that possible surgery may be necessary. An x-ray that was done at [facility] showed an irregular calcaneal body and calcaneal tuberosity. This is probably postprocedural. Previous x-rays at [facility] had suggested heterotrophic calcifications but I do not see this. Apparently a second ointment was added to the Silvadene which the patient thinks is because some improvement. She has not been systemically unwell. No fever or chills. She thinks the second ointment that was given to her at [facility] has helped somewhat. 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 ABIs done in June 2018 showed a ABI of 1.12 on the right and 1.1 on the left she was biphasic bilaterally. ABIs in our clinic were noncompressible today. 11/20/18  on evaluation today patient is seen for her second visit here in the office although this is actually the first visit with me concerning an issue that she's having with her great toe and heel of the left foot. Fortunately she does not appear to be have any discomfort at this time and she does have a scooter in order to offload her foot she also has a boot for offloading. With that being said this is something that appears to have been going on for some time she was seeing Dr. due to his recommendation was that she was going to require a below knee amputation. With that being said she wanted to come to the wound center but according to the patient Dr. Sharol Given told her that "we could not help her". Nonetheless she saw another provider who suggested that it may be worth a shot for [name] to try and help her out if it all possible. Nonetheless she decided that it would be worth a trial since otherwise any the way she's gonna end up with an amputation. Obviously if we can heal the wound then that will not be the case. Again I explained to her that obviously there are no guarantees but we will give this a good try and attempts to get the wound to heal. 1/30; the patient continues to have areas on the left plantar heel and the left plantar great toe. The more worrisome area is the heel. She went for MRI on Saturday but this could not be done because she had silver alginate in the wound bed. I would like to get this rebooked. If she does not have osteomyelitis she will need a total contact cast. We have been  using silver alginate in the wounds 2/7; patient has wounds on her left plantar heel and left plantar great toe. The area on the heel has some depth although it looks about the same today. Her MRI will finally be done tomorrow. If she has osteomyelitis in the heel and then we will need to consider her for IV antibiotics and hyperbaric oxygen. If the MRI is negative she will need a total contact cast although she is wearing a cam boot and using a scooter at present. Will be using silver alginate. She will take it off tomorrow in preparation for the MRI 2/14; the patient's MRI showed extensive surgical changes with a large portion of her calcaneus removed on the left secondary to her previous surgery by Dr. Sharol Given however there is no evidence of osteomyelitis. She would therefore be a candidate for a total contact cast and we applied this for the first time today 2/21; silver collagen total contact cast. The area on the plantar left great toe is "healed" still a lot of callus on this area. Dimensions on the plantar heel not too much different some epithelialization is present however. This is an improvement 12/25/18 on evaluation today patient actually is seen for follow-up concerning issues that she has been having with her cast she states she feels like it's wet and squishy in the bottom of her cast. The reason she does come in to have this evaluated and see what is going on. Fortunately there's no signs of infection at this time was the cast was removed. 3/6; the patient's area on the tip of her right great toe is callused but there is no open area here. She still has the fairly extensive area in the left heel. We have been using silver collagen in the wound. 01/08/19  on evaluation today patient actually appears to be doing very well in regard to her heel ulcer in my pinion to see if you shown signs of improvement which is excellent news. There's no evidence of infection. She continues to have quite  a bit of drainage but fortunately again this doesn't seem to be hindering her healing. 3/20 -Patient's foot ulcer on the left appears to be doing well, the dimensions appear encouraging, we have been using total contact cast with Prisma and will continue doing that 3/27; patient continues to make nice progress on the left plantar heel. She is using silver collagen under a total contact cast. It is been a while since I have seen this wound and it really looks a lot better. Smaller with healthy granulation 4/3; she continues to make nice progress on the left plantar heel. Using silver collagen under a total contact cast 4/10; left plantar heel. Again skin over the surface of the wound with not much in the way of adherence. This results in undermining. Using silver collagen changed to silver alginate 4/17 left plantar heel. Again not much improvement. There is no undermining today no debridement was required we used silver alginate last time because of excessive moisture 4/24 left plantar heel. Again not as much improvement as I would have liked. About 3 mm of depth. Thick callused skin around the circumference. Using silver alginate 5/1; left plantar heel this is improved this week. Less depth epithelialization is present. Using silver alginate alginate under a total contact cast 5/8; left plantar heel dimensions are about the same. The depth appears to be improved. I use silver collagen starting today under the cast 5/15 left plantar heel. Arrives today with a 2-day history of feeling like something had "slipped" while walking her dog in the cast. Unfortunately extensive area relatively to the small wound of undermining superiorly denuded epithelium. 5/22-Patient returns at 1 week after being taken off the [facility] on account of some fluid collection that was debrided and cultured at last visit from the plantar ulcer on the left heel. The culture results are polymicrobial with  Klebsiella, enterococcus, Proteus growth these organisms are sensitive to Cipro except with enterococcus which is sensitive to ampicillin but patient is highly allergic to penicillin according to her. 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 5/29; the patient arrived last week with a new wound on her left plantar great toe. With regards to the culture that I did 2 weeks ago of her deteriorating heel wound this grew Klebsiella and enterococcus. She was given Cipro however the enterococcus would not be covered well by a quinolone. She is allergic to penicillin. I will give her linezolid 600 twice daily x5 days 6/12; the patient continues to have a wound on her left and after she returns to the beach there is undermining laterally. She has the new wound from this 2 weeks or so ago on the plantar tip of her left great toe. She had a fall today scraping the dorsal surface of the left fifth 7/14; READMISSION since the patient was last here she was hospitalized from 04/15/2019 through 04/22/2019. She had presented to an outside ER after falling and hitting her elbow. She had had previous surgery on the elbow and fractures several years ago. An x-ray was negative and she was sent home. She is readmitted with sepsis and acute renal failure secondary to septic arthritis of the  elbow. Cultures apparently showed pansensitive staph aureus however she has a severe beta-lactam allergy. She was treated with vancomycin. Apparently the vancomycin is completed and her PICC line is removed although she is seeing Dr. Megan Salon tomorrow due to continued pain and swelling. In the hospital she had an IandD by Dr. [name] of orthopedics. The original surgery was on 04/17/2019 and it was felt that she had septic arthritis. She is still having a lot of pain and swelling in the right elbow and apparently is due to see Dr. Megan Salon tomorrow  and what she thinks is that she will be restarted on antibiotics. With regards to her heel wounds/left foot wounds. Her arterial studies were checked and her ABIs were within normal limits. X-ray showed plantar foot ulcer negative for osteomyelitis postop resection of the posterior calcaneus. She has been using silver alginate to the heel 8/4-Patient returns after being seen on 7/14, we are using silver alginate to the calcaneal wound, she is continuing to receive care for her right elbow septic arthritis 8/14- Patient returns after 1 week, she is being seen by the surgical group for her right elbow, she is here for the left calcaneal wound for which we are using silver alginate this is about the same 8/21; this is a patient I readmitted to the clinic 5 weeks ago. She is continuing to have difficulties with a septic arthritis of the right elbow she had after a fall and apparently has had surgical IandD's since the last time we saw her. She is also following with Dr. Megan Salon of infectious disease and is apparently on 3 oral antibiotics although at the time of this dictation I am not sure what they are. She comes in today with a necrotic surface on the left great toe with a blister laterally. This was still clearly an open wound. She had purulent drainage coming out of this. The original wound on the plantar aspect of her right heel in the setting of a Charcot foot is deep not open to bone but certainly not any better at all. We have been using silver alginate 8/28; dealing with septic arthritis of the right elbow. May need to go for further surgery here in the second week of September. She had a blister on the left great toe that was purulent [place] draining last week although a culture did not grow anything [already on antibiotics through infectious disease for her elbow]. The punched-out area on her heel is just like it was when she first came in. This almost closed with a total contact cast there  are no options for that now. The patient states she cannot stay off her foot having to do housework X-ray of the foot showed a moderate bunion and severe degenerative changes at the first metatarsal phalangeal joint there was a moderate plantar calcaneal spur. We will need to check the location of this. 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 It is oral. We are using silver collagen to the punched-out area on her left heel and to the summer superficial area of the left great toe. She is offloading this is best she can although judging by the amount of callus it is not enough. She thinks she has enough strength in her right elbow now to use her scooter. There is not an option for a cast 9/18;  still dealing with septic arthritis of the elbow she is apparently going for an MRI and a possible procedure next week she is on clindamycin and I have reviewed this with Dr. Hale Bogus notes and infectious disease. We are using silver collagen to the punched-out areas on her left heel and to the plantar aspect of her left great toe she is now using her scooter to get around and to help offload these areas 10/2; 2-week follow-up. Still on clindamycin I believe for the left elbow she is going for an MRI of the elbow over the weekend. She is then going to see orthopedics and infectious disease. The area on the left heel is certainly no better. This does not probe to bone however there is green drainage. She has an area on the tip of her toe. The area on the heel is in a wound we almost closed at one point with total contact casting. 10/9; one-week follow-up. Culture I did of the heel last week which was a swab culture admittedly showed moderate Enterococcus faecalis moderate Pseudomonas. The wound itself looks about the same. There is no palpable bone although  the depth of it closely approximates bone. The heel is swollen but not erythematous. Her MRI is booked for next week. She also has an MRI of the right elbow. I've also lifted the last consult from Dr. Megan Salon who is following her for septic arthritis of the right elbow. I did not see him specifically comment on her left heel however the patient states that he is aware of this. I did not specifically address the organisms I cultured last week because of the possible effect of any additional cultures that will be done on the elbow. Additionally these were superficial not bone cultures Objective Constitutional Patient is hypertensive.. Pulse regular and within target range for patient.[name] Kitchen Respirations regular, non-labored and within target range.. Temperature is normal and within the target range for the patient.. patient does not look to be systemically unwell. Vitals Time Taken: 3:58 PM, Height: 62 in, Weight: 180 lbs, BMI: 32.9, Temperature: 98.4 F, Pulse: 80 bpm, Respiratory Rate: 18 breaths/min, Blood Pressure: 158/89 mmHg. Eyes Conjunctivae clear. No discharge.no icterus. Respiratory work of breathing is normal. Cardiovascular pedal pulses are palpable on the left. Musculoskeletal right elbow is wrapped I did not disturb this. there is swelling of the left heel but no erythema. The wound is the approximate bone but there is no palpable bone. Psychiatric appears at normal baseline. General Notes: wound exam; still are concerning deep punched area on the plantar left heel. There is no erythema here the base of this appears dusky. I don't think this is changed significantly from last week Integumentary (Hair, Skin) Skin and subcutaneous tissue without rashes, lesions. Skin and subcutaneous tissue without induration or subcutaneous nodules.. Wound #3 status is Open. Original cause of wound was Trauma. The wound is located on the Left Calcaneus. The wound measures 2.6cm length x 2cm  width x 0.3cm depth; 4.084cm^2 area and 1.225cm^3 volume. There is Fat Layer (Subcutaneous Tissue) Exposed exposed. There is no tunneling or undermining noted. There is a medium amount of serosanguineous drainage noted. The wound margin is flat and intact. There is medium (34-66%) pink granulation within the wound bed. There is a medium (34-66%) amount of necrotic tissue within the wound bed including Adherent Slough. Wound #6 status is Open. Original cause of wound was Blister. The wound is located on the Left Toe Great. The wound measures 0.3cm length x 0.2cm width  x 0.1cm depth; 0.047cm^2 area and 0.005cm^3 volume. There is Fat Layer (Subcutaneous Tissue) Exposed exposed. There is no tunneling or undermining noted. There is a small amount of serosanguineous drainage noted. The wound margin is flat and intact. There is large (67-100%) pink granulation within the wound bed. There is no necrotic tissue within the wound bed. 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 Dressing Change Frequency: Wound #3 Left Calcaneus: Change Dressing every other day. Wound #6 Left Toe Great: Change Dressing every other day. Wound Cleansing: Wound #3 Left Calcaneus: May shower and wash wound with soap and water. Wound #6 Left Toe Great: May shower and wash wound with soap and water. Primary Wound Dressing: Wound #3 Left Calcaneus: Silver Collagen - to line wound bed, use saline moistened gauze to fill space behind collagen Wound #6 Left Toe Great: Silver Collagen - moisten with saline Secondary Dressing: Wound #3 Left Calcaneus: Kerlix/Rolled Gauze Dry Gauze Other: - felt callous pad Wound #6 Left Toe Great: Kerlix/Rolled Gauze Dry Gauze Other: - felt  callous pad or foam donut Edema Control: Elevate legs to the level of the heart or above for 30 minutes daily and/or when sitting, a frequency of: - throughout the day Off-Loading: Other: - use knee scooter or motorized chair , minimal weight bearing left foot #1 left plantar heel this is a wwound we almost had closed roughly in June. Abruptly deteriorated at some short timeframe after that. I thought that this was excessive pressure we had had her in a cast. 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 The condition of this would appear not to be healthy. She is having an MRI next week. The connection between the 2 wounds is open some speculation. #3 I suspect this patient is going to need intravenous antibiotics again. Her clindamycin was stopped this week by Dr. Megan Salon. I did not want to add additional antibiotics that might affect any culture we do have any of these areas. #4 the patient states that Dr. Megan Salon is aware of the heel wound. I'll likely communicate with him next week Electronic Signature(s) Signed: 08/10/2019 10:27:00 AM By: Linton Ham MD Entered By: Linton Ham on 08/10/2019 10:06:03 -------------------------------------------------------------------------------- SuperBill Details Patient Name: Date of Service: [name] [name], [name] [name] 08/08/2019 Medical Record [id number] Patient Account Number: [id number] Date of Birth/Sex: Treating RN: 15-Mar-1956 (63 y.o. 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 L97.522 Non-pressure chronic ulcer of other part of left foot with fat layer exposed E11.621 Type 2 diabetes mellitus with foot ulcer Facility  Procedures CPT4 Code: [id number] Description: 99213 - WOUND CARE VISIT-LEV 3 EST PT Modifier: Quantity: 1 Physician Procedures CPT4 Code Description: [id number] 26203 - WC PHYS LEVEL 3 - EST PT ICD-10 Diagnosis Description L97.423 Non-pressure chronic ulcer of left heel and midfoot with ne E11.621 Type 2 diabetes mellitus with foot ulcer Modifier: crosis of muscle Quantity: 1 Electronic Signature(s) Signed: 08/10/2019 10:27:00 AM By: Linton Ham MD Entered By: Linton Ham on 08/10/2019 10:06:28

## 2019-08-13 ENCOUNTER — Telehealth: Payer: Self-pay

## 2019-08-13 ENCOUNTER — Encounter: Admit: 2019-08-13 | Discharge: 2019-08-13 | Payer: MEDICARE

## 2019-08-13 NOTE — Telephone Encounter (Signed)
Called patient to schedule appointment to review MRI results. Appointment made for 10/19 at 4pm with Dr. Megan Salon. Patient did not answer, left a voicemail.   Patient has MRI appointment for the evening of 10/19; rescheduled appointment with Dr. Megan Salon for 10/20 at 4pm. Left new voicemail outlining changes and asked her to call with any changes or questions.   Bryden Darden Lorita Officer, RN

## 2019-08-13 NOTE — Telephone Encounter
Rhonda Fletcher called to give you her glucose numbers.  08/11/19 1042am 197: 1151am 188 after meds: 249pm  94  08/12/19 627am 124  08/13/19 659am 97   Said she is feeling better and sleeping better.

## 2019-08-13 NOTE — Telephone Encounter
Ok - sounds good, she should stay on the same medications.

## 2019-08-14 ENCOUNTER — Encounter (HOSPITAL_COMMUNITY): Payer: Self-pay

## 2019-08-14 ENCOUNTER — Ambulatory Visit (HOSPITAL_COMMUNITY): Payer: PRIVATE HEALTH INSURANCE

## 2019-08-14 ENCOUNTER — Ambulatory Visit (HOSPITAL_COMMUNITY)
Admission: RE | Admit: 2019-08-14 | Discharge: 2019-08-14 | Disposition: A | Discharge status: Home / Self Care | Payer: PRIVATE HEALTH INSURANCE | Source: Ambulatory Visit | Attending: Orthopedic Surgery | Admitting: Orthopedic Surgery

## 2019-08-14 ENCOUNTER — Other Ambulatory Visit: Payer: Self-pay

## 2019-08-14 DIAGNOSIS — M25521 Pain in right elbow: Secondary | ICD-10-CM | POA: Diagnosis not present

## 2019-08-14 LAB — POCT I-STAT CREATININE: Creatinine, Ser: 1.7 mg/dL — ABNORMAL HIGH (ref 0.44–1.00)

## 2019-08-14 NOTE — Telephone Encounter (Signed)
Patient returned call, front desk confirmed her appointment Tuesday 10/20 at 4. Landis Gandy, RN

## 2019-08-15 ENCOUNTER — Ambulatory Visit (HOSPITAL_COMMUNITY)
Admission: RE | Admit: 2019-08-15 | Discharge: 2019-08-15 | Disposition: A | Discharge status: Home / Self Care | Payer: PRIVATE HEALTH INSURANCE | Source: Ambulatory Visit | Attending: Orthopedic Surgery | Admitting: Orthopedic Surgery

## 2019-08-15 ENCOUNTER — Encounter (HOSPITAL_BASED_OUTPATIENT_CLINIC_OR_DEPARTMENT_OTHER): Payer: PRIVATE HEALTH INSURANCE | Admitting: Internal Medicine

## 2019-08-15 DIAGNOSIS — M25521 Pain in right elbow: Secondary | ICD-10-CM | POA: Diagnosis present

## 2019-08-15 MED ORDER — GADOBUTROL 1 MMOL/ML IV SOLN
8.0000 mL | Freq: Once | INTRAVENOUS | Status: AC | PRN
  Administered 2019-08-15: 8 mL via INTRAVENOUS

## 2019-08-18 ENCOUNTER — Ambulatory Visit: Payer: PRIVATE HEALTH INSURANCE | Admitting: Internal Medicine

## 2019-08-18 ENCOUNTER — Other Ambulatory Visit: Payer: Self-pay

## 2019-08-18 ENCOUNTER — Ambulatory Visit (HOSPITAL_COMMUNITY)
Admission: RE | Admit: 2019-08-18 | Discharge: 2019-08-18 | Disposition: A | Discharge status: Home / Self Care | Payer: PRIVATE HEALTH INSURANCE | Source: Ambulatory Visit | Attending: Internal Medicine | Admitting: Internal Medicine

## 2019-08-18 DIAGNOSIS — L97423 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: Secondary | ICD-10-CM | POA: Diagnosis not present

## 2019-08-18 MED ORDER — GADOBUTROL 1 MMOL/ML IV SOLN
6.0000 mL | Freq: Once | INTRAVENOUS | Status: AC | PRN
  Administered 2019-08-18: 6 mL via INTRAVENOUS

## 2019-08-18 NOTE — Progress Notes (Signed)
SANTASIA, REW ([id number]) Visit Report for 08/15/2019 HPI Details Patient Name: Date of Service: [name], [name] 08/15/2019 3:15 PM Medical Record [id number] Patient Account Number: [id number] Date of Birth/Sex: Treating RN: 11/09/1955 (63 y.o. [name] [name] Primary Care Provider: [name] Other Clinician: Referring Provider: Treating Provider/Extender:Aquarius Tremper, Enedina Finner Weeks in Treatment: 21 History of Present Illness HPI Description: ADMISSION 11/12/2018 This is a 63 year old woman with type 2 diabetes and diabetic neuropathy. She has been dealing with a left heel plantar wound for roughly 2 years. She states this started when she pulled some skin off the area and it progressed into a wound. She also has an area on the tip of her left great toe for 1 year. She has been largely followed by Dr. Sharol Given and she had an excision of bone in the left heel in 2017. I do not see microbiology from this excision or pathology. Apparently this wound never really closed. She most recently has been using Silvadene cream. Offloading this with a scooter. The last MRI I see was in June 2018 which did not show osteomyelitis at that time. Apparently this wound is never really progressed towards healing. During her last review by Dr. Sharol Given in October it was recommended that she undergo a left BKA and she refused. She went to see a second orthopedic consult at [facility] who recommended conservative wound care to see if this will close or progress towards closure but also warned that possible surgery may be necessary. An x-ray that was done at [facility] showed an irregular calcaneal body and calcaneal tuberosity. This is probably postprocedural. Previous x-rays at [facility] had suggested heterotrophic calcifications but I do not see this. Apparently a second ointment was added to the Silvadene which the patient thinks is because some improvement. She has not been systemically  unwell. No fever or chills. She thinks the second ointment that was given to her at [facility] has helped somewhat. 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 ABIs done in June 2018 showed a ABI of 1.12 on the right and 1.1 on the left she was biphasic bilaterally. ABIs in our clinic were noncompressible today. 11/20/18 on evaluation today patient is seen for her second visit here in the office although this is actually the first visit with me concerning an issue that she's having with her great toe and heel of the left foot. Fortunately she does not appear to be have any discomfort at this time and she does have a scooter in order to offload her foot she also has a boot for offloading. With that being said this is something that appears to have been going on for some time she was seeing Dr. due to his recommendation was that she was going to require a below knee amputation. With that being said she wanted to come to the wound center but according to the patient Dr. Sharol Given told her that "we could not help her". Nonetheless she saw another provider who suggested that it may be worth a shot for [name] to try and help her out if it all possible. Nonetheless she decided that it would be worth a trial since otherwise any the way she's gonna  end up with an amputation. Obviously if we can heal the wound then that will not be the case. Again I explained to her that obviously there are no guarantees but we will give this a good try and attempts to get the wound to heal. 1/30; the patient continues to have areas on the left plantar heel and the left plantar great toe. The more worrisome area is the heel. She went for  MRI on Saturday but this could not be done because she had silver alginate in the wound bed. I would like to get this rebooked. If she does not have osteomyelitis she will need a total contact cast. We have been using silver alginate in the wounds 2/7; patient has wounds on her left plantar heel and left plantar great toe. The area on the heel has some depth although it looks about the same today. Her MRI will finally be done tomorrow. If she has osteomyelitis in the heel and then we will need to consider her for IV antibiotics and hyperbaric oxygen. If the MRI is negative she will need a total contact cast although she is wearing a cam boot and using a scooter at present. Will be using silver alginate. She will take it off tomorrow in preparation for the MRI 2/14; the patient's MRI showed extensive surgical changes with a large portion of her calcaneus removed on the left secondary to her previous surgery by Dr. Sharol Given however there is no evidence of osteomyelitis. She would therefore be a candidate for a total contact cast and we applied this for the first time today 2/21; silver collagen total contact cast. The area on the plantar left great toe is "healed" still a lot of callus on this area. Dimensions on the plantar heel not too much different some epithelialization is present however. This is an improvement 12/25/18 on evaluation today patient actually is seen for follow-up concerning issues that she has been having with her cast she states she feels like it's wet and squishy in the bottom of her cast. The reason she does come in to have this evaluated and see what is going on. Fortunately there's no signs of infection at this time was the cast was removed. 3/6; the patient's area on the tip of her right great toe is callused but there is no open area here. She still has the fairly extensive area in the left heel. We have been using silver collagen in the wound. 01/08/19 on evaluation today  patient actually appears to be doing very well in regard to her heel ulcer in my pinion to see if you shown signs of improvement which is excellent news. There's no evidence of infection. She continues to have quite a bit of drainage but fortunately again this doesn't seem to be hindering her healing. 3/20 -Patient's foot ulcer on the left appears to be doing well, the dimensions appear encouraging, we have been using total contact cast with Prisma and will continue doing that 3/27; patient continues to make nice progress on the left plantar heel. She is using silver collagen under a total contact cast. It is been a while since I have seen this wound and it really looks a lot better. Smaller with healthy granulation 4/3; she continues to make nice progress on the left plantar heel. Using silver collagen under a total contact cast 4/10; left plantar heel. Again skin over the surface of the wound with not much in the way of adherence. This results in undermining. Using  silver collagen changed to silver alginate 4/17 left plantar heel. Again not much improvement. There is no undermining today no debridement was required we used silver alginate last time because of excessive moisture 4/24 left plantar heel. Again not as much improvement as I would have liked. About 3 mm of depth. Thick callused skin around the circumference. Using silver alginate 5/1; left plantar heel this is improved this week. Less depth epithelialization is present. Using silver alginate alginate under a total contact cast 5/8; left plantar heel dimensions are about the same. The depth appears to be improved. I use silver collagen starting today under the cast 5/15 left plantar heel. Arrives today with a 2-day history of feeling like something had "slipped" while walking her dog in the cast. Unfortunately extensive area relatively to the small wound of undermining superiorly denuded epithelium. 5/22-Patient returns at 1 week  after being taken off the TCC on account of some fluid collection that was debrided and cultured at last visit from the plantar ulcer on the left heel. The culture results are polymicrobial with Klebsiella, enterococcus, Proteus growth these organisms are sensitive to Cipro except with enterococcus which is sensitive to ampicillin but patient is highly allergic to penicillin according to her. 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 5/29; the patient arrived last week with a new wound on her left plantar great toe. With regards to the culture that I did 2 weeks ago of her deteriorating heel wound this grew Klebsiella and enterococcus. She was given Cipro however the enterococcus would not be covered well by a quinolone. She is allergic to penicillin. I will give her linezolid 600 twice daily x5 days 6/12; the patient continues to have a wound on her left and after she returns to the beach there is undermining laterally. She has the new wound from this 2 weeks or so ago on the plantar tip of her left great toe. She had a fall today scraping the dorsal surface of the left fifth 7/14; READMISSION since the patient was last here she was hospitalized from 04/15/2019 through 04/22/2019. She had presented to an outside ER after falling and hitting her elbow. She had had previous surgery on the elbow and fractures several years ago. An x-ray was negative and she was sent home. She is readmitted with sepsis and acute renal failure secondary to septic arthritis of the elbow. Cultures apparently showed pansensitive staph aureus however she has a severe beta-lactam allergy. She was treated with vancomycin. Apparently the vancomycin is completed and her PICC line is removed although she is seeing Dr. Megan Salon tomorrow due to continued pain and swelling. In the hospital she had an IandD by Dr. [name] of orthopedics. The  original surgery was on 04/17/2019 and it was felt that she had septic arthritis. She is still having a lot of pain and swelling in the right elbow and apparently is due to see Dr. Megan Salon tomorrow and what she thinks is that she will be restarted on antibiotics. With regards to her heel wounds/left foot wounds. Her arterial studies were checked and her ABIs were within normal limits. X-ray showed plantar foot ulcer negative for osteomyelitis postop resection of the posterior calcaneus. She has been using silver alginate to the heel 8/4-Patient returns after being seen on 7/14, we are using silver alginate to the calcaneal wound, she is continuing to receive care for her right elbow septic arthritis  8/14- Patient returns after 1 week, she is being seen by the surgical group for her right elbow, she is here for the left calcaneal wound for which we are using silver alginate this is about the same 8/21; this is a patient I readmitted to the clinic 5 weeks ago. She is continuing to have difficulties with a septic arthritis of the right elbow she had after a fall and apparently has had surgical IandD's since the last time we saw her. She is also following with Dr. Megan Salon of infectious disease and is apparently on 3 oral antibiotics although at the time of this dictation I am not sure what they are. She comes in today with a necrotic surface on the left great toe with a blister laterally. This was still clearly an open wound. She had purulent drainage coming out of this. The original wound on the plantar aspect of her right heel in the setting of a Charcot foot is deep not open to bone but certainly not any better at all. We have been using silver alginate 8/28; dealing with septic arthritis of the right elbow. May need to go for further surgery here in the second week of September. She had a blister on the left great toe that was purulent [place] draining last week although a culture did not grow  anything [already on antibiotics through infectious disease for her elbow]. The punched-out area on her heel is just like it was when she first came in. This almost closed with a total contact cast there are no options for that now. The patient states she cannot stay off her foot having to do housework X-ray of the foot showed a moderate bunion and severe degenerative changes at the first metatarsal phalangeal joint there was a moderate plantar calcaneal spur. We will need to check the location of this. 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 It is oral. We are using silver collagen to the punched-out area on her left heel and to the summer superficial area of the left great toe. She is offloading this is best she can although judging by the amount of callus it is not enough. She thinks she has enough strength in her right elbow now to use her scooter. 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 Hale Bogus notes and infectious disease. We are using silver collagen to the punched-out areas on her left heel and to the plantar aspect of her left great toe she is now using her scooter to get around and to help offload these areas 10/2; 2-week follow-up. Still on clindamycin I believe for the left elbow she is going for an MRI of the elbow over the weekend. She is then going to see orthopedics and infectious disease. The area on the left heel is certainly no better. This does not probe to bone however there is green drainage. She has an area on the tip of her toe. The area on the heel is in a wound we almost closed at one point with total contact casting. 10/9; one-week follow-up.  Culture I did of the heel last week which was a swab culture admittedly  showed moderate Enterococcus faecalis moderate Pseudomonas. The wound itself looks about the same. There is no palpable bone although the depth of it closely approximates bone. The heel is swollen but not erythematous. Her MRI is booked for next week. She also has an MRI of the right elbow. I've also lifted the last consult from Dr. Megan Salon who is following her for septic arthritis of the right elbow. I did not see him specifically comment on her left heel however the patient states that he is aware of this. I did not specifically address the organisms I cultured last week because of the possible effect of any additional cultures that will be done on the elbow. Additionally these were superficial not bone cultures 10/16; her MRI of the heel was put back to next week. She did have her elbow done. I have been in contact with Dr. Megan Salon about my concerns about osteomyelitis of the left heel. We have been using collagen. She also has a wound on the plantar tip of her first toe Electronic Signature(s) Signed: 08/15/2019 5:33:39 PM By: Linton Ham MD Entered By: Linton Ham on 08/15/2019 17:22:08 -------------------------------------------------------------------------------- Physical Exam Details Patient Name: Date of Service: [name] [name], TOLLIVER 08/15/2019 3:15 PM Medical Record [id number] Patient Account Number: [id number] Date of Birth/Sex: Treating RN: 1955/11/14 (63 y.o. [name] [name] Primary Care Provider: [name] Other Clinician: Referring Provider: Treating Provider/Extender:Bryanne Riquelme, Lennie Odor CHASE, Tora Kindred in Treatment: 56 Constitutional Patient is hypertensive.. Pulse regular and within target range for patient.[name] Kitchen Respirations regular, non-labored and within target range.. Temperature is normal and within the target range for the patient.[name] Kitchen Appears in no  distress. Cardiovascular Needle pulses are palpable. Notes Wound exam; still a deep punched out area on the plantar left heel this actually looks no better. The heel itself appears swollen. There is an area on the tip of her toe. No debridement in either area. Electronic Signature(s) Signed: 08/15/2019 5:33:39 PM By: Linton Ham MD Entered By: Linton Ham on 08/15/2019 17:23:00 -------------------------------------------------------------------------------- Physician Orders Details Patient Name: Date of Service: DREMA, EDDINGTON 08/15/2019 3:15 PM Medical Record [id number] Patient Account Number: [id number] Date of Birth/Sex: Treating RN: 1956/08/05 (63 y.o. 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 Dressing Change Frequency Wound #3 Left Calcaneus Change Dressing every other day. Wound #6 Left Toe Great Change Dressing every other day. Wound Cleansing Wound #3 Left Calcaneus May shower and wash wound with soap and water. Wound #6 Left Toe Great May shower and wash wound with soap and water. Primary Wound Dressing Wound #3 Left Calcaneus Collagen - to line wound bed, use saline moistened gauze to fill space behind collagen Wound #6 Left Toe Great Collagen - moisten with saline Secondary Dressing Wound #3 Left Calcaneus Kerlix/Rolled Gauze Dry Gauze Other: - felt callous pad Wound #6 Left Toe Great Kerlix/Rolled Gauze Dry Gauze Other: - felt callous pad or foam donut Edema  Control Elevate legs to the level of the heart or above for 30 minutes daily and/or when sitting, a frequency of: - throughout the day Off-Loading Other: - use knee scooter or motorized chair ,  minimal weight bearing left foot Electronic Signature(s) Signed: 08/15/2019 5:33:39 PM By: Linton Ham MD Signed: 08/15/2019 6:00:55 PM By: [name] RN, BSN Entered By: [name] on 08/15/2019 16:22:45 -------------------------------------------------------------------------------- Problem List Details Patient Name: Date of Service: TABBY, BEASTON 08/15/2019 3:15 PM Medical Record [id number] Patient Account Number: [id number] Date of Birth/Sex: Treating RN: [birth year] (63 y.o. [name] [name] Primary Care Provider: Carollee Herter, Kendrick Fries Other Clinician: Referring Provider: Treating Provider/Extender:Trinten Boudoin, Enedina Finner Weeks in Treatment: 39 Active Problems ICD-10 Evaluated Encounter Evaluated Encounter Code Description Active Date Today Diagnosis L97.423 Non-pressure chronic ulcer of left heel and midfoot 11/12/2018 No Yes with necrosis of muscle L97.522 Non-pressure chronic ulcer of other part of left foot 11/12/2018 No Yes with fat layer exposed E11.621 Type 2 diabetes mellitus with foot ulcer 11/12/2018 No Yes Inactive Problems Resolved Problems Electronic Signature(s) Signed: 08/15/2019 5:33:39 PM By: Linton Ham MD Entered By: Linton Ham on 08/15/2019 17:21:04 -------------------------------------------------------------------------------- Progress Note Details Patient Name: Date of Service: [name] 08/15/2019 3:15 PM Medical Record [id number] Patient Account Number: [id number] Date of Birth/Sex: Treating RN: 1956-02-25 (63 y.o. [name] [name] Primary Care Provider: [name] Other Clinician: Referring Provider: Treating Provider/Extender:Sergio Zawislak, Enedina Finner Weeks in Treatment:  39 Subjective History of Present Illness (HPI) ADMISSION 11/12/2018 This is a 63 year old woman with type 2 diabetes and diabetic neuropathy. She has been dealing with a left heel plantar wound for roughly 2 years. She states this started when she pulled some skin off the area and it progressed into a wound. She also has an area on the tip of her left great toe for 1 year. She has been largely followed by Dr. Sharol Given and she had an excision of bone in the left heel in 2017. I do not see microbiology from this excision or pathology. Apparently this wound never really closed. She most recently has been using Silvadene cream. Offloading this with a scooter. The last MRI I see was in June 2018 which did not show osteomyelitis at that time. Apparently this wound is never really progressed towards healing. During her last review by Dr. Sharol Given in October it was recommended that she undergo a left BKA and she refused. She went to see a second orthopedic consult at [facility] who recommended conservative wound care to see if this will close or progress towards closure but also warned that possible surgery may be necessary. An x-ray that was done at [facility] showed an irregular calcaneal body and calcaneal tuberosity. This is probably postprocedural. Previous x-rays at [facility] had suggested heterotrophic calcifications but I do not see this. Apparently a second ointment was added to the Silvadene which the patient thinks is because some improvement. She has not been systemically unwell. No fever or chills. She thinks the second ointment that was given to her at [facility] has helped somewhat. 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 ABIs done in June 2018 showed a ABI of 1.12 on the right and 1.1 on the left she was biphasic bilaterally. ABIs in our clinic were noncompressible today. 11/20/18 on evaluation today patient is seen for her second visit here in the office although this is actually the first  visit with me concerning an issue that she's having with her great toe and heel of the left foot. Fortunately she does not appear to be have any discomfort at this time and she does have a scooter in order to offload her foot she also has a boot for offloading. With that being said this is something that appears to have been going on for some time she was seeing Dr. due to his recommendation was that she was going to require a below knee amputation. With that being said she wanted to come to the wound center but according to the patient Dr. Sharol Given told her that "we could not help her". Nonetheless she saw another provider who suggested that it may be worth a shot for [name] to try and help her out if it all possible. Nonetheless she decided that it would be worth a trial since otherwise any the way she's gonna end up with an amputation. Obviously if we can heal the wound then that will not be the case. Again I explained to her that obviously there are no guarantees but we will give this a good try and attempts to get the wound to heal. 1/30; the patient continues to have areas on the left plantar heel and the left plantar great toe. The more worrisome area is the heel. She went for MRI on Saturday but this could not be done because she had silver alginate in the wound bed. I would like to get this rebooked. If she does not have osteomyelitis she will need a total contact cast. We have been using silver alginate in the wounds 2/7; patient has wounds on her left plantar heel and left plantar great toe. The area on the heel has some depth although it looks about the same today. Her MRI will finally be done tomorrow. If  she has osteomyelitis in the heel and then we will need to consider her for IV antibiotics and hyperbaric oxygen. If the MRI is negative she will need a total contact cast although she is wearing a cam boot and using a scooter at present. Will be using silver alginate. She will take it off tomorrow in preparation for the MRI 2/14; the patient's MRI showed extensive surgical changes with a large portion of her calcaneus removed on the left secondary to her previous surgery by Dr. Sharol Given however there is no evidence of osteomyelitis. She would therefore be a candidate for a total contact cast and we applied this for the first time today 2/21; silver collagen total contact cast. The area on the plantar left great toe is "healed" still a lot of callus on this area. Dimensions on the plantar heel not too much different some epithelialization is present however. This is an improvement 12/25/18 on evaluation today patient actually is seen for follow-up concerning issues that she has been having with her cast she states she feels like it's wet and squishy in the bottom of her cast. The reason she does come in to have this evaluated and see what is going on. Fortunately there's no signs of infection at this time was the cast was removed. 3/6; the patient's area on the tip of her right great toe is callused but there is no open area here. She still has the fairly extensive area in the left heel. We have been using silver collagen in the wound. 01/08/19 on evaluation today patient actually appears to be doing very well in regard to her heel ulcer in my pinion  to see if you shown signs of improvement which is excellent news. There's no evidence of infection. She continues to have quite a bit of drainage but fortunately again this doesn't seem to be hindering her healing. 3/20 -Patient's foot ulcer on the left appears to be doing well, the dimensions appear encouraging, we have been using total contact cast with  Prisma and will continue doing that 3/27; patient continues to make nice progress on the left plantar heel. She is using silver collagen under a total contact cast. It is been a while since I have seen this wound and it really looks a lot better. Smaller with healthy granulation 4/3; she continues to make nice progress on the left plantar heel. Using silver collagen under a total contact cast 4/10; left plantar heel. Again skin over the surface of the wound with not much in the way of adherence. This results in undermining. Using silver collagen changed to silver alginate 4/17 left plantar heel. Again not much improvement. There is no undermining today no debridement was required we used silver alginate last time because of excessive moisture 4/24 left plantar heel. Again not as much improvement as I would have liked. About 3 mm of depth. Thick callused skin around the circumference. Using silver alginate 5/1; left plantar heel this is improved this week. Less depth epithelialization is present. Using silver alginate alginate under a total contact cast 5/8; left plantar heel dimensions are about the same. The depth appears to be improved. I use silver collagen starting today under the cast 5/15 left plantar heel. Arrives today with a 2-day history of feeling like something had "slipped" while walking her dog in the cast. Unfortunately extensive area relatively to the small wound of undermining superiorly denuded epithelium. 5/22-Patient returns at 1 week after being taken off the [facility] on account of some fluid collection that was debrided and cultured at last visit from the plantar ulcer on the left heel. The culture results are polymicrobial with Klebsiella, enterococcus, Proteus growth these organisms are sensitive to Cipro except with enterococcus which is sensitive to ampicillin but patient is highly allergic to penicillin according to her. This wound appears larger and there is a new small  skin depth wound on the great toe plantar aspect patient does have hammertoes. 5/29; the patient arrived last week with a new wound on her left plantar great toe. With regards to the culture that I did 2 weeks ago of her deteriorating heel wound this grew Klebsiella and enterococcus. She was given Cipro however the enterococcus would not be covered well by a quinolone. She is allergic to penicillin. I will give her linezolid 600 twice daily x5 days 6/12; the patient continues to have a wound on her left and after she returns to the beach there is undermining laterally. She has the new wound from this 2 weeks or so ago on the plantar tip of her left great toe. She had a fall today scraping the dorsal surface of the left fifth 7/14; READMISSION since the patient was last here she was hospitalized from 04/15/2019 through 04/22/2019. She had presented to an outside ER after falling and hitting her elbow. She had had previous surgery on the elbow and fractures several years ago. An x-ray was negative and she was sent home. She is readmitted with sepsis and acute renal failure secondary to septic arthritis of the elbow. Cultures apparently showed pansensitive staph aureus however she has a severe beta-lactam allergy. She was treated with vancomycin. Apparently  the vancomycin is completed and her PICC line is removed although she is seeing Dr. Megan Salon tomorrow due to continued pain and swelling. In the hospital she had an IandD by Dr. [name] of orthopedics. The original surgery was on 04/17/2019 and it was felt that she had septic arthritis. She is still having a lot of pain and swelling in the right elbow and apparently is due to see Dr. Megan Salon tomorrow and what she thinks is that she will be restarted on antibiotics. With regards to her heel wounds/left foot wounds. Her arterial studies were checked and her ABIs were within normal limits. X-ray showed plantar foot ulcer negative for osteomyelitis  postop resection of the posterior calcaneus. She has been using silver alginate to the heel 8/4-Patient returns after being seen on 7/14, we are using silver alginate to the calcaneal wound, she is continuing to receive care for her right elbow septic arthritis 8/14- Patient returns after 1 week, she is being seen by the surgical group for her right elbow, she is here for the left calcaneal wound for which we are using silver alginate this is about the same 8/21; this is a patient I readmitted to the clinic 5 weeks ago. She is continuing to have difficulties with a septic arthritis of the right elbow she had after a fall and apparently has had surgical IandD's since the last time we saw her. She is also following with Dr. Megan Salon of infectious disease and is apparently on 3 oral antibiotics although at the time of this dictation I am not sure what they are. She comes in today with a necrotic surface on the left great toe with a blister laterally. This was still clearly an open wound. She had purulent drainage coming out of this. The original wound on the plantar aspect of her right heel in the setting of a Charcot foot is deep not open to bone but certainly not any better at all. We have been using silver alginate 8/28; dealing with septic arthritis of the right elbow. May need to go for further surgery here in the second week of September. She had a blister on the left great toe that was purulent [place] draining last week although a culture did not grow anything [already on antibiotics through infectious disease for her elbow]. The punched-out area on her heel is just like it was when she first came in. This almost closed with a total contact cast there are no options for that now. The patient states she cannot stay off her foot having to do housework X-ray of the foot showed a moderate bunion and severe degenerative changes at the first metatarsal phalangeal joint there was a moderate plantar  calcaneal spur. We will need to check the location of this. 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 It is oral. We are using silver collagen to the punched-out area on her left heel and to the summer superficial area of the left great toe. She is offloading this is best she can although judging by the amount of callus it is not enough. She thinks she has enough strength in her right elbow now to use her scooter. There is not an option for a cast 9/18; still dealing with septic arthritis of the elbow she is apparently going for an MRI and a possible procedure next  week she is on clindamycin and I have reviewed this with Dr. Hale Bogus notes and infectious disease. We are using silver collagen to the punched-out areas on her left heel and to the plantar aspect of her left great toe she is now using her scooter to get around and to help offload these areas 10/2; 2-week follow-up. Still on clindamycin I believe for the left elbow she is going for an MRI of the elbow over the weekend. She is then going to see orthopedics and infectious disease. The area on the left heel is certainly no better. This does not probe to bone however there is green drainage. She has an area on the tip of her toe. The area on the heel is in a wound we almost closed at one point with total contact casting. 10/9; one-week follow-up. Culture I did of the heel last week which was a swab culture admittedly showed moderate Enterococcus faecalis moderate Pseudomonas. The wound itself looks about the same. There is no palpable bone although the depth of it closely approximates bone. The heel is swollen but not erythematous. Her MRI is booked for next week. She also has an MRI of the right elbow. I've also lifted the last consult from Dr. Megan Salon who is following her for septic  arthritis of the right elbow. I did not see him specifically comment on her left heel however the patient states that he is aware of this. I did not specifically address the organisms I cultured last week because of the possible effect of any additional cultures that will be done on the elbow. Additionally these were superficial not bone cultures 10/16; her MRI of the heel was put back to next week. She did have her elbow done. I have been in contact with Dr. Megan Salon about my concerns about osteomyelitis of the left heel. We have been using collagen. She also has a wound on the plantar tip of her first toe Objective Constitutional Patient is hypertensive.. Pulse regular and within target range for patient.[name] Kitchen Respirations regular, non-labored and within target range.. Temperature is normal and within the target range for the patient.[name] Kitchen Appears in no distress. Vitals Time Taken: 3:36 PM, Height: 62 in, Weight: 180 lbs, BMI: 32.9, Temperature: 98.4 F, Pulse: 81 bpm, Respiratory Rate: 18 breaths/min, Blood Pressure: 147/75 mmHg. Cardiovascular Needle pulses are palpable. General Notes: Wound exam; still a deep punched out area on the plantar left heel this actually looks no better. The heel itself appears swollen. There is an area on the tip of her toe. No debridement in either area. Integumentary (Hair, Skin) Wound #3 status is Open. Original cause of wound was Trauma. The wound is located on the Left Calcaneus. The wound measures 2.6cm length x 2.5cm width x 1.2cm depth; 5.105cm^2 area and 6.126cm^3 volume. There is Fat Layer (Subcutaneous Tissue) Exposed exposed. There is no tunneling or undermining noted. There is a medium amount of serosanguineous drainage noted. The wound margin is flat and intact. There is medium (34-66%) pink granulation within the wound bed. There is a medium (34-66%) amount of necrotic tissue within the wound bed including Adherent Slough. Wound #6 status is Open.  Original cause of wound was Blister. The wound is located on the Left Toe Great. The wound measures 0.3cm length x 0.2cm width x 0.2cm depth; 0.047cm^2 area and 0.009cm^3 volume. There is Fat Layer (Subcutaneous Tissue) Exposed exposed. There is no tunneling or undermining noted. There is a small amount of serosanguineous drainage noted. The wound margin  is flat and intact. There is large (67-100%) pink granulation within the wound bed. There is no necrotic tissue within the wound bed. 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 Dressing Change Frequency: Wound #3 Left Calcaneus: Change Dressing every other day. Wound #6 Left Toe Great: Change Dressing every other day. Wound Cleansing: Wound #3 Left Calcaneus: May shower and wash wound with soap and water. Wound #6 Left Toe Great: May shower and wash wound with soap and water. Primary Wound Dressing: Wound #3 Left Calcaneus: Collagen - to line wound bed, use saline moistened gauze to fill space behind collagen Wound #6 Left Toe Great: Collagen - moisten with saline Secondary Dressing: Wound #3 Left Calcaneus: Kerlix/Rolled Gauze Dry Gauze Other: - felt callous pad Wound #6 Left Toe Great: Kerlix/Rolled Gauze Dry Gauze Other: - felt callous pad or foam donut Edema Control: Elevate legs to the level of the heart or above for 30 minutes daily and/or when sitting, a frequency of: - throughout the day Off-Loading: Other: - use knee scooter or motorized chair , minimal weight bearing left foot 1. I am putting Promogran so as not to interfere with her MRI to the left great toe and left calcaneus 2. Awaiting the MRI before we do anything further about this. 3. She has had an MRI of her right  elbow and is due to see Dr. Megan Salon next week. I have communicated with Dr. Megan Salon about my concerns about the deep plantar heel wound Electronic Signature(s) Signed: 08/15/2019 5:33:39 PM By: Linton Ham MD Entered By: Linton Ham on 08/15/2019 17:23:57 -------------------------------------------------------------------------------- SuperBill Details Patient Name: Date of Service: FREDDA, CLARIDA 08/15/2019 Medical Record [id number] Patient Account Number: [id number] Date of Birth/Sex: Treating RN: November 27, 1955 (63 y.o. 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 L97.522 Non-pressure chronic ulcer of other part of left foot with fat layer exposed E11.621 Type 2 diabetes mellitus with foot ulcer Facility Procedures CPT4 Code: [id number] Description: 99213 - WOUND CARE VISIT-LEV 3 EST PT Modifier: Quantity: 1 Physician Procedures CPT4 Code Description: [id number] - WC PHYS LEVEL 2 - EST PT ICD-10 Diagnosis Description L97.423 Non-pressure chronic ulcer of left heel and midfoot with E11.621 Type 2 diabetes mellitus with foot ulcer Modifier: necrosis of Quantity: 1 muscle Electronic Signature(s) Signed: 08/15/2019 6:00:55 PM By: [name] RN, BSN Signed: 08/18/2019 5:54:03 PM By: Linton Ham MD Previous Signature: 08/15/2019 5:33:39 PM Version By: Linton Ham MD Entered By: [name] on 08/15/2019 17:58:27

## 2019-08-19 ENCOUNTER — Encounter: Payer: Self-pay | Admitting: Internal Medicine

## 2019-08-19 ENCOUNTER — Ambulatory Visit (INDEPENDENT_AMBULATORY_CARE_PROVIDER_SITE_OTHER): Payer: PRIVATE HEALTH INSURANCE | Admitting: Internal Medicine

## 2019-08-19 DIAGNOSIS — L97421 Non-pressure chronic ulcer of left heel and midfoot limited to breakdown of skin: Secondary | ICD-10-CM

## 2019-08-19 DIAGNOSIS — M00021 Staphylococcal arthritis, right elbow: Secondary | ICD-10-CM | POA: Diagnosis not present

## 2019-08-19 NOTE — Assessment & Plan Note (Addendum)
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  She also has poorly controlled diabetes and is status post renal transplant with chronic renal insufficiency.  There is also the difficulty of a past anaphylactic reaction to cefazolin.  She is very motivated to do everything reasonable to heal these infections.  She is worried that she might need amputation of her right arm and left foot in the worst case scenario.  I do not feel comfortable with holding antibiotics pending arrangement of a new orthopedic evaluation.  She agrees to restart IV antibiotic therapy.  I will check lab work today and place orders for placement of a tunneled central catheter.  I will start her on IV daptomycin 8 mg/kg and plan on at least 6 weeks of therapy.  She will follow-up here in 3 to 4 weeks.

## 2019-08-19 NOTE — Progress Notes (Addendum)
[facility]  Patient Active Problem List   Diagnosis Date Noted  . Septic arthritis of elbow, right ([place]) 04/17/2019    Priority: High  . Ulcer of left heel, limited to breakdown of skin ([place]) 02/01/2017    Priority: Medium  . Pressure injury of skin 04/17/2019  . 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 Acute on chronic renal insufficiency 04/15/2019  . Sleep apnea 11/16/2017  . Diabetic foot infection ([place]) 04/06/2017  . Type 2 diabetes mellitus with hyperglycemia, with long-term current use of insulin ([place]) 04/06/2017  . Neuropathy 05/05/2015  . Diabetic peripheral neuropathy ([place]) 01/12/2015  . Infection associated with orthopedic device ([place]) 12/11/2014  . CKD (chronic kidney disease), stage III ([place]) 05/27/2014  . History of MI (myocardial infarction) 10/06/2013  . Obesity (BMI 30-39.9) 09/20/2013  . 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 Hyponatremia 02/03/2013  . Normocytic anemia 02/03/2013  . CAD (coronary artery disease) 01/11/2012  . Hepatic cirrhosis ([place]) 07/06/2010  . 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  . Hyperlipidemia 06/03/2008  . Essential hypertension 12/23/2006    Patient's Medications  New Prescriptions   No medications on file  Previous Medications   AMINO ACIDS-PROTEIN HYDROLYS (FEEDING SUPPLEMENT, PRO-STAT SUGAR FREE 64,) LIQD    Take 30 mLs by mouth 2 (two) times daily.   ASPIRIN EC 81 MG EC TABLET    Take 1 tablet (81 mg total) by mouth 2 (two) times daily.   ATORVASTATIN (LIPITOR) 10 MG TABLET    Take 1 tablet by mouth once daily   CALCIUM ALGINATE EX    Apply 1 patch topically every other day.   CYCLOBENZAPRINE (FLEXERIL) 10 MG TABLET    Take 1 tablet (10 mg total) by mouth 3 (three) times daily as needed for up to 15 doses for muscle spasms.   DOCUSATE SODIUM (COLACE) 100 MG CAPSULE    Take 100 mg by mouth daily.   DULOXETINE (CYMBALTA) 60 MG CAPSULE    Take 1 capsule by mouth once daily    ENSURE MAX PROTEIN (ENSURE MAX PROTEIN) LIQD    Take 330 mLs (11 oz total) by mouth 2 (two) times daily.   EPINEPHRINE 0.3 MG/0.3 ML IJ SOAJ INJECTION    Inject into the muscle.   FERROUS SULFATE (IRON) 325 (65 FE) MG TABS    Take 1 tablet by mouth daily.    FLUTICASONE (FLONASE) 50 MCG/ACT NASAL SPRAY    Place 2 sprays into both nostrils daily.   GUAIFENESIN (MUCINEX) 600 MG 12 HR TABLET    Take 1 tablet (600 mg total) by mouth 2 (two) times daily.   HYDROCHLOROTHIAZIDE (HYDRODIURIL) 25 MG TABLET    Take 1 tablet (25 mg total) by mouth daily. RESUME AFTER EVALUATED BY PCP   HYDROCODONE-ACETAMINOPHEN (NORCO/VICODIN) 5-325 MG TABLET    Take 1 tablet by mouth every 6 (six) hours as needed for moderate pain.   INSULIN GLARGINE (LANTUS SOLOSTAR) 100 UNIT/ML SOLOSTAR PEN    INJECT 20 UNITS SUBCUTANEOUSLY ONCE DAILY IN THE MORNING AND THEN INJECT 40 UNITS AT BEDTIME   INSULIN LISPRO (HUMALOG KWIKPEN) 200 UNIT/ML SOPN    Inject 13 Units into the skin daily.   MULTIPLE VITAMINS-MINERALS (MULTIVITAMIN GUMMIES WOMENS) CHEW    Chew 2 each by mouth daily.   NUTRITIONAL SUPPLEMENTS (FEEDING SUPPLEMENT, NEPRO CARB STEADY,) LIQD    Take 237 mLs by mouth 3 (three) times daily as needed (Supplement).   NYSTATIN (MYCOSTATIN/NYSTOP) POWDER  Apply topically 3 (three) times daily.   ONDANSETRON (ZOFRAN ODT) 4 MG DISINTEGRATING TABLET    Take 1 tablet (4 mg total) by mouth every 8 (eight) hours as needed for nausea or vomiting.   POLYETHYLENE GLYCOL (MIRALAX / GLYCOLAX) PACKET    Take 17 g by mouth daily as needed for mild constipation.    PREGABALIN (LYRICA) 150 MG CAPSULE    Take 1 capsule (150 mg total) by mouth 2 (two) times daily.  Modified Medications   No medications on file  Discontinued Medications   VANCOMYCIN (VANCOCIN) 10 G SOLR INJECTION        Subjective: Ms. Hoback in for her routine follow-up visit.  She is a 64 y.o.femalewith multiple medical problems including poorly controlled diabetes.  In January 2016 she fell and suffered a fracture dislocation of the head of her right radius. She underwent arthroplasty but developed early postoperative infection due to methicillin sensitive coagulase-negative staph. She had incision and drainage with hardware removal and placement of an external fixator. She was treated with IV antibiotics. Based on notes from [facility] it appears that she had a serious allergic reaction to cefazolin and completed 6 weeks of therapy with clindamycin.  She recently began having fever, chills and increasing right elbow pain leading to admission on 04/15/2019. Her temperature was 100.9 degrees. 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 Associated posttraumatic degenerative changes at the joint". Arthrocentesis yielded 10 cc of pus. The specimen clotted so no white blood cell count was performed but there was a predominance of segmented neutrophils. No crystals were seen. Gram stain of synovial fluid showed gram-positive cocci and cultures grew MSSA. She underwent incision and drainage.  I elected to treat her with 2 weeks of IV vancomycin and then have her transition to oral doxycycline.  When I saw her in July she still had quite a bit of redness and swelling.  I changed her to oral clindamycin.  Unfortunately, she is missed 2 follow-up appointments since that time.  She was on clindamycin nearly continuously but developed much more drainage from her right elbow.  She is having to change her dressing twice daily.    When I saw her on 08/05/2019 I asked her to stop clindamycin.  She has continued to have copious drainage.  She underwent MRI on 08/16/2019 which showed:  IMPRESSION: 1. Findings compatible with septic arthritis of the right elbow with associated acute osteomyelitis involving the distal humerus and proximal radius and ulna. 2. Lateral skin ulceration  with enhancing tract extending to bone. Extensive susceptibility artifact within the adjacent soft tissues may represent soft tissue gas and or metallic debris related to prior surgery. 3. Chronic posttraumatic deformity of the elbow.  Her local orthopedic surgeon Dr. Malena Catholic called her recently and said that he was going to refer her to [facility] or [facility] to an orthopedic team that could manage the complex surgery that she will need on her right elbow.  She tells me that she would like to continue her infectious disease care here.  In addition to her smoldering right elbow infection, she has been struggling with a poorly healing right heel ulcer.  4 years ago she developed MRSA soft tissue infection of her left heel.  3 years ago she developed left heel osteomyelitis and underwent partial calcaneal excision and wound debridement.  I do not see that any operative cultures were  obtained.  She has had trouble with wound healing ever since.  She has been followed by Dr. Dossie Der at the wound center.  Recent wound cultures are growing multiple organisms including Enterococcus, Pseudomonas, Klebsiella and Proteus.  I am certain that anaerobes are present as well.  She underwent an MRI of her left foot yesterday which showed:  IMPRESSION: Skin ulceration with a small underlying fluid collection compatible with abscess. Negative for osteomyelitis, septic joint or myositis.   Review of Systems: Review of Systems  Constitutional: Positive for malaise/fatigue. Negative for chills, diaphoresis and fever.  Respiratory: Negative for cough and shortness of breath.   Cardiovascular: Negative for chest pain.  Gastrointestinal: Negative for abdominal pain, diarrhea, nausea and vomiting.  Musculoskeletal: Positive for joint pain.    Past Medical History:  Diagnosis Date  . Acute MI [facility]) 2007   presented to ED & had cardiac cath- but found to have normal coronaries.  Since that point in time her PCP cares f or cardiac needs. Dr. [name] - [facility]  . Anemia   . Anginal pain ([place])   . Anxiety   . Asthma   . Bulging lumbar disc   . Cataract   . 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 Cirrhosis of liver without mention of alcohol   . Constipation   . Dehiscence of closure of skin    left partial calcaneal excision  . Depression   . Diabetes mellitus    insulin dependent, adult onset  . Episode of visual loss of left eye   . Exertional shortness of breath   . Fatty liver   . Fibromyalgia   . GERD (gastroesophageal reflux disease)   . Hepatic steatosis   . High cholesterol   . Hypertension   . MRSA (methicillin resistant Staphylococcus aureus)   . Neuropathy    lower legs  . Osteoarthritis    hands, hips  . Proximal humerus fracture 10/15/12   Left  . PTSD (post-traumatic stress disorder)   . Renal insufficiency 05/05/2015    Social History   Tobacco Use  . Smoking status: Never Smoker  . Smokeless tobacco: Never Used  Substance Use Topics  . Alcohol use: No    Alcohol/week: 0.0 standard drinks  . Drug use: No    Family History  Problem Relation Age of Onset  . Heart disease Father   . Diabetes Father   . Colitis Father   . Crohn's disease Father   . Cancer Father        leukemia  . Leukemia Father   . Diabetes Mellitus II Brother   . Kidney disease Brother   . Heart disease Brother   . Diabetes Mother   . Hypertension Mother   . Mental illness Mother   . Irritable bowel syndrome Daughter   . Diabetes Mellitus II Brother   . Kidney disease Brother   . Liver disease Brother   . Kidney disease Brother   . Heart attack Brother   . Diabetes Mellitus II Brother   . Heart disease Brother   . Liver disease Brother   . Kidney disease Brother   . Kidney disease Brother   . Diabetes Mellitus II Brother   . Diabetes Mellitus I Brother     Allergies  Allergen  Reactions  . Bee Pollen Anaphylaxis  . Fish-Derived Products Hives, Shortness Of Breath, Swelling and Rash    Hives get  in throat causing trouble breathing  . Mushroom Extract Complex Anaphylaxis  . Penicillins Anaphylaxis    Did it involve swelling of the face/tongue/throat, SOB, or low BP? Yes Did it involve sudden or severe rash/hives, skin peeling, or any reaction on the inside of your mouth or nose? No Did you need to seek medical attention at a hospital or doctor's office? Yes When did it last happen?A few months ago If all above answers are "NO", may proceed with cephalosporin use.  [name] Kitchen Rosemary Oil Anaphylaxis  . Shellfish Allergy Hives, Shortness Of Breath, Swelling and Rash  . Tomato Hives and Shortness Of Breath    Hives in throat causes her trouble breathing  . Acetaminophen Other (See Comments)    GI upset  . Acyclovir And Related Other (See Comments)    Unknown rxn  . Aloe Vera Hives  . Broccoli [Brassica Oleracea Italica] Hives  . Naproxen Other (See Comments)    Unknown rxn    Objective: Vitals:   08/19/19 1601  BP: (!) 153/83  Pulse: 85  Temp: 98.4 F (36.9 C)   There is no height or weight on file to calculate BMI.  Physical Exam Constitutional:      Comments: She appears more worried today.  Cardiovascular:     Rate and Rhythm: Normal rate and regular rhythm.     Heart sounds: No murmur.  Pulmonary:     Effort: Pulmonary effort is normal.     Breath sounds: Normal breath sounds.  Abdominal:     Palpations: Abdomen is soft.     Tenderness: There is no abdominal tenderness.  Musculoskeletal:     Comments: No change in her right elbow sinus tract.  Her left foot was wrapped and not examined today.  Skin:    Findings: No rash.       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  Her treatment is complicated by  many factors including that she also has a complex chronic left heel ulcer with polymicrobial infection.  She also has poorly controlled diabetes and is status post renal transplant with chronic renal insufficiency.  There is also the difficulty of a past anaphylactic reaction to cefazolin.  She is very motivated to do everything reasonable to heal these infections.  She is worried that she might need amputation of her right arm and left foot in the worst case scenario.  I do not feel comfortable with holding antibiotics pending arrangement of a new orthopedic evaluation.  She agrees to restart IV antibiotic therapy.  I will check lab work today and place orders for placement of a tunneled central catheter.  I will start her on IV daptomycin 8 mg/kg and plan on at least 6 weeks of therapy.  She will follow-up here in 3 to 4 weeks.      Relevant Orders   CBC (Completed)   Basic metabolic panel (Completed)   Sedimentation rate (Completed)   C-reactive protein (Completed)   IR Fluoro Guide CV Line Right     Medium   Ulcer of left heel, limited to breakdown of skin ([place])    I will start her on oral levofloxacin and metronidazole.          Michel Bickers, MD [facility] [phone] pager  [phone] cell 08/21/2019, 11:10 AM

## 2019-08-19 NOTE — Addendum Note (Signed)
Addended by: Michel Bickers on: 08/19/2019 04:41 PM   Modules accepted: Orders

## 2019-08-20 ENCOUNTER — Other Ambulatory Visit: Payer: Self-pay | Admitting: Family Medicine

## 2019-08-20 ENCOUNTER — Telehealth: Payer: Self-pay | Admitting: Pharmacy Technician

## 2019-08-20 ENCOUNTER — Telehealth: Payer: Self-pay | Admitting: Pharmacist

## 2019-08-20 DIAGNOSIS — S42401A Unspecified fracture of lower end of right humerus, initial encounter for closed fracture: Secondary | ICD-10-CM

## 2019-08-20 DIAGNOSIS — M797 Fibromyalgia: Secondary | ICD-10-CM

## 2019-08-20 DIAGNOSIS — M79672 Pain in left foot: Secondary | ICD-10-CM

## 2019-08-20 LAB — BASIC METABOLIC PANEL
BUN/Creatinine Ratio: 9 (calc) (ref 6–22)
BUN: 17 mg/dL (ref 7–25)
CO2: 26 mmol/L (ref 20–32)
Calcium: 8.9 mg/dL (ref 8.6–10.4)
Chloride: 104 mmol/L (ref 98–110)
Creat: 1.83 mg/dL — ABNORMAL HIGH (ref 0.50–0.99)
Glucose, Bld: 382 mg/dL — ABNORMAL HIGH (ref 65–99)
Potassium: 4.9 mmol/L (ref 3.5–5.3)
Sodium: 138 mmol/L (ref 135–146)

## 2019-08-20 LAB — CBC
HCT: 34.1 % — ABNORMAL LOW (ref 35.0–45.0)
Hemoglobin: 10.6 g/dL — ABNORMAL LOW (ref 11.7–15.5)
MCH: 26.6 pg — ABNORMAL LOW (ref 27.0–33.0)
MCHC: 31.1 g/dL — ABNORMAL LOW (ref 32.0–36.0)
MCV: 85.7 fL (ref 80.0–100.0)
MPV: 12 fL (ref 7.5–12.5)
Platelets: 143 10*3/uL (ref 140–400)
RBC: 3.98 10*6/uL (ref 3.80–5.10)
RDW: 13.8 % (ref 11.0–15.0)
WBC: 6.3 10*3/uL (ref 3.8–10.8)

## 2019-08-20 LAB — C-REACTIVE PROTEIN: CRP: 11.1 mg/L — ABNORMAL HIGH (ref <8.0)

## 2019-08-20 LAB — SEDIMENTATION RATE: Sed Rate: 55 mm/h — ABNORMAL HIGH (ref 0–30)

## 2019-08-20 NOTE — Telephone Encounter (Signed)
[facility] Patient Advocate Encounter    Findings of the benefits investigation:   Insurance: [employer]- active Some of the high cost medications will require a cost exceeds max override.  [facility] Patient Advocate will follow up with any assistance needed with her medication.  Bartholomew Crews, CPhT Specialty Pharmacy Patient [facility] for Infectious Disease Phone: [phone] Fax: [phone] 08/20/2019 1:57 PM

## 2019-08-20 NOTE — Telephone Encounter (Signed)
Called [facility] to see how much daptomycin would cost patient.  Stanton Kidney will run it through [employer] today. I will call her first thing tomorrow morning to get price.

## 2019-08-20 NOTE — Telephone Encounter (Signed)
rx refill HYDROcodone-acetaminophen (NORCO/VICODIN) 5-325 MG tablet [facility]Y [id number] - [place], [place] - [address] [place] [address] [phone] (Phone) [phone] (Fax)

## 2019-08-20 NOTE — Telephone Encounter (Signed)
Requesting: NORCO Contract: 08/16/2017 UDS: 05/06/2018, low risk Last OV: 06/26/2019 Next OV: 09/05/2019 Last Refill: 07/25/2019, #120--0 RF Database:   Please advise

## 2019-08-21 ENCOUNTER — Other Ambulatory Visit: Payer: Self-pay | Admitting: Family Medicine

## 2019-08-21 ENCOUNTER — Other Ambulatory Visit: Payer: Self-pay | Admitting: Internal Medicine

## 2019-08-21 ENCOUNTER — Telehealth (HOSPITAL_COMMUNITY): Payer: Self-pay

## 2019-08-21 ENCOUNTER — Other Ambulatory Visit: Payer: Self-pay

## 2019-08-21 ENCOUNTER — Telehealth: Payer: Self-pay

## 2019-08-21 ENCOUNTER — Encounter: Admit: 2019-08-21 | Discharge: 2019-08-21 | Payer: MEDICARE

## 2019-08-21 ENCOUNTER — Ambulatory Visit: Admit: 2019-08-21 | Discharge: 2019-08-21 | Payer: MEDICARE

## 2019-08-21 DIAGNOSIS — M797 Fibromyalgia: Secondary | ICD-10-CM

## 2019-08-21 DIAGNOSIS — M79672 Pain in left foot: Secondary | ICD-10-CM

## 2019-08-21 DIAGNOSIS — M00021 Staphylococcal arthritis, right elbow: Secondary | ICD-10-CM

## 2019-08-21 DIAGNOSIS — S42401A Unspecified fracture of lower end of right humerus, initial encounter for closed fracture: Secondary | ICD-10-CM

## 2019-08-21 DIAGNOSIS — L97421 Non-pressure chronic ulcer of left heel and midfoot limited to breakdown of skin: Secondary | ICD-10-CM

## 2019-08-21 DIAGNOSIS — Z789 Other specified health status: Secondary | ICD-10-CM

## 2019-08-21 DIAGNOSIS — I10 Essential (primary) hypertension: Secondary | ICD-10-CM

## 2019-08-21 DIAGNOSIS — E1129 Type 2 diabetes mellitus with other diabetic kidney complication: Secondary | ICD-10-CM

## 2019-08-21 DIAGNOSIS — D649 Anemia, unspecified: Secondary | ICD-10-CM

## 2019-08-21 DIAGNOSIS — K2970 Gastritis, unspecified, without bleeding: Secondary | ICD-10-CM

## 2019-08-21 DIAGNOSIS — D509 Iron deficiency anemia, unspecified: Secondary | ICD-10-CM

## 2019-08-21 DIAGNOSIS — G5600 Carpal tunnel syndrome, unspecified upper limb: Secondary | ICD-10-CM

## 2019-08-21 DIAGNOSIS — E559 Vitamin D deficiency, unspecified: Secondary | ICD-10-CM

## 2019-08-21 DIAGNOSIS — J309 Allergic rhinitis, unspecified: Secondary | ICD-10-CM

## 2019-08-21 DIAGNOSIS — G9050 Complex regional pain syndrome I, unspecified: Secondary | ICD-10-CM

## 2019-08-21 DIAGNOSIS — E785 Hyperlipidemia, unspecified: Secondary | ICD-10-CM

## 2019-08-21 DIAGNOSIS — M79642 Pain in left hand: Secondary | ICD-10-CM

## 2019-08-21 DIAGNOSIS — J45909 Unspecified asthma, uncomplicated: Secondary | ICD-10-CM

## 2019-08-21 DIAGNOSIS — K5900 Constipation, unspecified: Secondary | ICD-10-CM

## 2019-08-21 DIAGNOSIS — K589 Irritable bowel syndrome without diarrhea: Secondary | ICD-10-CM

## 2019-08-21 DIAGNOSIS — E119 Type 2 diabetes mellitus without complications: Secondary | ICD-10-CM

## 2019-08-21 LAB — CBC AND DIFF
Lab: 0.1 10*3/uL (ref 0–0.20)
Lab: 0.2 10*3/uL (ref 0–0.45)
Lab: 0.5 10*3/uL (ref 0–0.80)
Lab: 1 % (ref 0–2)
Lab: 2.5 10*3/uL (ref 1.0–4.8)
Lab: 3 % (ref 0–5)
Lab: 5.5 10*3/uL (ref 1.8–7.0)
Lab: 9.1 K/UL — ABNORMAL HIGH (ref 4.5–11.0)

## 2019-08-21 LAB — BASIC METABOLIC PANEL
Lab: 1.5 mg/dL — ABNORMAL HIGH (ref 0.4–1.00)
Lab: 10 mg/dL (ref 8.5–10.6)
Lab: 101 mg/dL — ABNORMAL HIGH (ref >60)
Lab: 108 MMOL/L — ABNORMAL LOW (ref 98–110)
Lab: 12 g/dL (ref >60)
Lab: 142 MMOL/L — ABNORMAL LOW (ref 137–147)
Lab: 22 MMOL/L (ref 21–30)
Lab: 28 mg/dL — ABNORMAL HIGH (ref 7–25)
Lab: 35 mL/min — ABNORMAL LOW (ref >60)
Lab: 4.1 MMOL/L — ABNORMAL LOW (ref 3.5–5.1)
Lab: 42 mL/min — ABNORMAL LOW (ref >60)

## 2019-08-21 LAB — IRON + BINDING CAPACITY + %SAT+ FERRITIN: Lab: 31 ug/dL — ABNORMAL LOW (ref 50–160)

## 2019-08-21 LAB — HEMOGLOBIN A1C: Lab: 6.4 % — ABNORMAL HIGH (ref 4.0–6.0)

## 2019-08-21 MED ORDER — GLIMEPIRIDE 2 MG PO TAB
2 mg | ORAL_TABLET | Freq: Every morning | ORAL | 3 refills | Status: DC
Start: 2019-08-21 — End: 2019-11-13

## 2019-08-21 MED ORDER — HYDROCODONE-ACETAMINOPHEN 5-325 MG PO TABS: 1.0000 | ORAL_TABLET | Freq: Four times a day (QID) | ORAL | 0 refills | Status: DC | PRN

## 2019-08-21 MED ORDER — SODIUM CHLORIDE 0.9 % IV SOLN: 8.0000 mg/kg | Freq: Every day | INTRAVENOUS | Status: DC

## 2019-08-21 MED ORDER — METRONIDAZOLE 500 MG PO TABS: 500.0000 mg | ORAL_TABLET | Freq: Three times a day (TID) | ORAL | 1 refills | Status: DC

## 2019-08-21 MED ORDER — LEVOFLOXACIN 250 MG PO TABS: 250.0000 mg | ORAL_TABLET | Freq: Every day | ORAL | 1 refills | Status: DC

## 2019-08-21 NOTE — Telephone Encounter (Signed)
Contacted [name] [name] at [facility] to confirm that orders were received. Informed her that we're currently waiting for the patient to schedule her appointment with IR for PICC placement and her first dose appointment with Short Stay. Once we have confirmation of both appointments we will contact them with updates.   Zyiere Rosemond [name] Officer, RN

## 2019-08-21 NOTE — Telephone Encounter (Signed)
Called to schedule picc placement, no answer, left vm. AW

## 2019-08-21 NOTE — Telephone Encounter (Signed)
We need uds and contract

## 2019-08-21 NOTE — Assessment & Plan Note (Signed)
I will start her on oral levofloxacin and metronidazole.

## 2019-08-21 NOTE — Progress Notes
Date of Service: 08/21/2019    Rhonda Fletcher is a 63 y.o. female.  DOB: 10/16/1956  MRN: [id number]     Subjective:             History of Present Illness    Chief Complaint   Patient presents with   ? Follow Up     DM check up     Last seen in July for hospital f/u after 3 day stay at [facility] with abd pain and black stool.  GI w/u negative, and she was doing better on Protonix.    She called on Sept 29th stating that GI had started her on Creon due to pancreatitis but was having frequent urination and felt that blood sugar was up.  Recommended she check blood sugars and call back with results.    Called back on Oct 14th, stating that she was feeling better and sleeping better, and gave blood sugar readings.  No med changes made.    She reports that she took Creon for a week but stopped it because of high blood sugars.  She did think it helped her abd pain, but she was urinating all of the time and is sure that her sugars were up, although she didn't have any strips to check her sugars that week.    Abdominal pain is better.  Bowels - moving daily but are loose most of the time.    Taking Glimepiride a whole pill in the morning and sugars are good - brings in monitor and sugars are in below 110.  She does note some hypoglycemic sxs if her sugar gets down to the 70's, but this usually only happens if her breakfast or lunch is delayed.    Some exercise - walking in neighborhood.    She notes that her grandson and his girlfriend are broken up - they are still living together but one of them is sleeping on the couch.  She states that the girlfriend is crazy and she is happy that the relationship isn't working out.  She also gets a lot of satisfaction seeing her daughters so upset about the relationship since they put Lidiana thru a lot of grief with THEIR poor decisions growing up!    Has chronic iron def anemia.  Have recommended iron infusions, but she has been resistant to these. Has diabetes, treated with Metformin.  A1C in 07/2018 was 7.5%.  A1C in 01/2019 ws higher at 7.7%.    Has HTN, treated with Amlodipine and Clonidine.  Has not tolerated ACEI or ARB.     Has Vit D def; taking supplement.     Has microalbuminuria but has not been able to tolerate ACEI or ARB.     Has reflex symp dystrophy of arms/hands x years, with chronic pain.  Has not tolerated Savella, Gabapentin, or Lyrica in the past.     Has GERD, treated with Protonix and Pepcid.     Has hyperlipidemia, diet tx'd.  Has not tolerated statin medications - have tried several.     Has IBS, stable.       Has chronic dermatitis.  Betamethasone diproprionate was too expensive, but she states that betamethason valerate is reasonable in cost.     Has had B12 def; taking supplement.    Has a h/o colon polyp; next colonoscopy due in 2025.    Due:  Mammogram  Pap test  Flu shot  Shingrix  Tdap       Review of Systems  No fever  No chills  No cough  No SOB    Objective:         ? albuterol sulfate (PROAIR HFA) 90 mcg/actuation aerosol inhaler Inhale two puffs by mouth into the lungs every 6 hours as needed.   ? Alcohol Swabs padm Use one a day to clean skin before fingerstick   ? amLODIPine (NORVASC) 10 mg tablet TAKE 1 TABLET EVERY DAY   ? betamethasone valerate (VALISONE) 0.1 % topical ointment APPLY  TOPICALLY TO AFFECTED AREA DAILY.   ? Blood Glucose Control, Normal soln Use in glucose meter, Accu-check Aviva   ? blood sugar diagnostic test strip Use one strip as directed daily before breakfast. accucheck aviva test strips DX E11.29   ? 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 cetirizine (ZYRTEC) 10 mg tablet Take 10 mg by mouth daily.     ? cholecalciferol(+) (VITAMIN D-3) 2,000 unit tablet Take 1 Tab by mouth daily.   ? cloNIDine (CATAPRESS) 0.1 mg tablet TAKE 1 TABLET THREE TIMES DAILY   ? CREON 24,000-76,000 -120,000 unit capsule ? cyclobenzaprine (FLEXERIL) 5 mg tablet TAKE 1 TABLET AT BEDTIME   ? famotidine (PEPCID) 20 mg tablet TAKE 1 TABLET TWICE DAILY   ? fluconazole (DIFLUCAN) 150 mg tablet Take one tablet by mouth daily.   ? fluticasone propionate (FLONASE) 50 mcg/actuation nasal spray, suspension Shake bottle gently before using.  Indications: Use 2 SPRAYS IN EACH NOSTRIL AS DIRECTED DAILY   ? lancets MISC Use one each as directed daily before breakfast. Diag: E11.29   ? metFORMIN (GLUCOPHAGE) 1,000 mg tablet TAKE 1 TABLET TWICE DAILY WITH MEALS   ? nortriptyline (PAMELOR) 25 mg capsule TAKE 1 CAPSULE BY MOUTH AT BEDTIME   ? pantoprazole DR (PROTONIX) 40 mg tablet TAKE 1 TABLET EVERY DAY   ? traZODone (DESYREL) 50 mg tablet TAKE 1 TABLET AT BEDTIME AS NEEDED FOR  SLEEP     Vitals:    08/21/19 1029   BP: (!) 148/83   BP Source: Arm, Right Upper   Patient Position: Sitting   Pulse: 80   SpO2: 100%   Weight: 70.8 kg (156 lb)     Body mass index is 25.68 kg/m?[name] Kitchen     Physical Exam    Alert, no distress     Assessment and Plan:  Rhonda Fletcher was seen today for diabetes.    Diagnoses and all orders for this visit:    Type 2 diabetes mellitus with microalbuminuria, without long-term current use of insulin (HCC)  -     BASIC METABOLIC PANEL; Future  -     HEMOGLOBIN A1C; Future    Hyperlipidemia, unspecified hyperlipidemia type    Statin intolerance    Iron deficiency anemia, unspecified iron deficiency anemia type  -     CBC AND DIFF; Future  -     IRON + BINDING CAPACITY + %SAT+ FERRITIN; Future    Other orders  -     nortriptyline (PAMELOR) 25 mg capsule; TAKE 1 CAPSULE BY MOUTH AT BEDTIME  -     CREON 24,000-76,000 -120,000 unit capsule  -     glimepiride (AMARYL) 2 mg tablet; Take one tablet by mouth every morning.  -     FLU VACCINE =>6 MONTHS QUADRIVALENT PF      Lab work today.  Flu shot given today.  Refilled Glimepiride - sent Rx to mail order pharmacy.  Encouraged her to  schedule Mammogram.  Will do Pap at her next appointment. Discussed Shingrix.  F/u in 6 mos for AWV/physical/pap.    Time spent: 30 min; greater than 50%of time spent in counseling and coordination of care; see above.

## 2019-08-21 NOTE — Patient Instructions
Due:    Flu shot    Mammogram    Pap test - will do at your physical in April    [facility] - new shingles vaccine  Series of 2 shots, 2-6 months apart  50% of people have a flu-like syndrome for 24-48 hours after the shot (low grade fever, fatigue, achiness).  Call us to check on availability.  Medicare patients - need to get this vaccination at the pharmacy.    Tetanus booster

## 2019-08-21 NOTE — Telephone Encounter (Signed)
Copied from [place] [phone]. Topic: General - Other >> Aug 21, 2019  9:08 AM Jodie Echevaria wrote: Reason for CRM: [facility] called to say that patient Rx for Hydrocodone was sent to them accidentally insurance will only pay for it at [facility] so asking if this Rx can be resent to [facility] on file please.

## 2019-08-22 ENCOUNTER — Encounter (HOSPITAL_BASED_OUTPATIENT_CLINIC_OR_DEPARTMENT_OTHER): Payer: PRIVATE HEALTH INSURANCE | Admitting: Internal Medicine

## 2019-08-22 ENCOUNTER — Other Ambulatory Visit: Payer: Self-pay

## 2019-08-22 DIAGNOSIS — E11621 Type 2 diabetes mellitus with foot ulcer: Secondary | ICD-10-CM | POA: Diagnosis not present

## 2019-08-22 NOTE — Progress Notes (Signed)
[name], [name] ([id number]) Visit Report for 08/22/2019 Debridement Details Patient Name: Date of Service: [name] [name], [name] [name] 08/22/2019 4:00 PM Medical Record [id number] Patient Account Number: [id number] Date of Birth/Sex: Treating RN: 1956-01-21 (63 y.o. [name] [name] Primary Care Provider: Carollee Herter, Kendrick Fries Other Clinician: Referring Provider: Carollee Herter, Kendrick Fries Treating Provider/Extender:Robson, Esperanza Richters in Treatment: 81 Debridement Performed for Wound #3 Left Calcaneus Assessment: Performed By: Physician Ricard Dillon., MD Debridement Type: Debridement Severity of Tissue Pre Fat layer exposed Debridement: Level of Consciousness (Pre- Awake and Alert procedure): Pre-procedure Verification/Time Out Taken: Yes - 17:26 Start Time: 17:26 Total Area Debrided (L x W): 2.5 (cm) x 2 (cm) = 5 (cm) Tissue and other material Viable, Non-Viable, Callus, Subcutaneous debrided: Level: Skin/Subcutaneous Tissue Debridement Description: Excisional Instrument: Curette Bleeding: Minimum Hemostasis Achieved: Pressure End Time: 17:28 Procedural Pain: 0 Post Procedural Pain: 0 Response to Treatment: Procedure was tolerated well Level of Consciousness Awake and Alert (Post-procedure): Post Debridement Measurements of Total Wound Length: (cm) 2.5 Width: (cm) 2 Depth: (cm) 0.7 Volume: (cm) 2.749 Character of Wound/Ulcer Post Improved Debridement: Severity of Tissue Post Debridement: Fat layer exposed Post Procedure Diagnosis Same as Pre-procedure Electronic Signature(s) Signed: 08/22/2019 5:59:58 PM By: [name] RN, BSN Signed: 08/22/2019 6:06:00 PM By: Linton Ham MD Entered By: Linton Ham on 08/22/2019 17:43:42 -------------------------------------------------------------------------------- Debridement Details Patient Name: Date of Service: [name] [name]. 08/22/2019 4:00 PM Medical Record [id number] Patient Account Number:  [id number] Date of Birth/Sex: Treating RN: [birth year] (63 y.o. [name] [name] Primary Care Provider: Carollee Herter, Kendrick Fries Other Clinician: Referring Provider: Carollee Herter, Kendrick Fries Treating Provider/Extender:Robson, Esperanza Richters in Treatment: 30 Debridement Performed for Wound #6 Left Toe Great Assessment: Performed By: Physician Ricard Dillon., MD Debridement Type: Debridement Severity of Tissue Pre Fat layer exposed Debridement: Level of Consciousness (Pre- Awake and Alert procedure): Pre-procedure Yes - 17:26 Verification/Time Out Taken: Start Time: 17:28 Total Area Debrided (L x W): 0.2 (cm) x 0.2 (cm) = 0.04 (cm) Tissue and other material Viable, Non-Viable, Callus, Subcutaneous debrided: Level: Skin/Subcutaneous Tissue Debridement Description: Excisional Instrument: Curette Bleeding: Minimum Hemostasis Achieved: Pressure End Time: 17:29 Procedural Pain: 0 Post Procedural Pain: 0 Response to Treatment: Procedure was tolerated well Level of Consciousness Awake and Alert (Post-procedure): Post Debridement Measurements of Total Wound Length: (cm) 0.2 Width: (cm) 0.2 Depth: (cm) 0.2 Volume: (cm) 0.006 Character of Wound/Ulcer Post Improved Debridement: Severity of Tissue Post Debridement: Fat layer exposed Post Procedure Diagnosis Same as Pre-procedure Electronic Signature(s) Signed: 08/22/2019 5:59:58 PM By: [name] RN, BSN Signed: 08/22/2019 6:06:00 PM By: Linton Ham MD Entered By: Linton Ham on 08/22/2019 17:43:49 -------------------------------------------------------------------------------- HPI Details Patient Name: Date of Service: [name] [name] 08/22/2019 4:00 PM Medical Record [id number] Patient Account Number: [id number] Date of Birth/Sex: Treating RN: 1956-07-14 (63 y.o. [name] [name] Primary Care Provider: [name] Other Clinician: Referring Provider: Treating Provider/Extender:Robson, Enedina Finner Weeks in Treatment: 40 History of Present Illness HPI Description: ADMISSION 11/12/2018 This is a 63 year old woman with type 2 diabetes and diabetic neuropathy. She has been dealing with a left heel plantar wound for roughly 2 years. She states this started when she pulled some skin off the area and it progressed into a wound. She also has an area on the tip of her left great toe for 1 year. She has been largely followed by Dr. Sharol Given and she had an excision of bone in the left heel in 2017. I do not see microbiology from this excision or  pathology. Apparently this wound never really closed. She most recently has been using Silvadene cream. Offloading this with a scooter. The last MRI I see was in June 2018 which did not show osteomyelitis at that time. Apparently this wound is never really progressed towards healing. During her last review by Dr. Sharol Given in October it was recommended that she undergo a left BKA and she refused. She went to see a second orthopedic consult at [facility] who recommended conservative wound care to see if this will close or progress towards closure but also warned that possible surgery may be necessary. An x-ray that was done at [facility] showed an irregular calcaneal body and calcaneal tuberosity. This is probably postprocedural. Previous x-rays at [facility] had suggested heterotrophic calcifications but I do not see this. Apparently a second ointment was added to the Silvadene which the patient thinks is because some improvement. She has not been systemically unwell. No fever or chills. She thinks the second ointment that was given to her at [facility] has helped somewhat. 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 ABIs done in June 2018 showed a ABI of 1.12 on the right and 1.1 on the left she was biphasic bilaterally. ABIs in our clinic were noncompressible today. 11/20/18 on evaluation today patient is seen for her second visit here in the office although this is actually the first visit with me concerning an issue that she's having with her great toe and heel of the left foot. Fortunately she does not appear to be have any discomfort at this time and she does have a scooter in order to offload her foot she also has a boot for offloading. With that being said this is something that appears to have been going on for some time she was seeing Dr. due to his recommendation was that she was going to require a below knee amputation. With that being said she wanted to come to the wound center but according to the patient Dr. Sharol Given told her that "we could not help her". Nonetheless she saw another provider who suggested that it may be worth a shot for [name] to try and help her out if it all possible. Nonetheless she decided that it would be worth a trial since otherwise any the way she's gonna end up with an amputation. Obviously if we can heal the wound then that will not be the case. Again I explained to her that obviously there are no guarantees but we will give this a good try and attempts to get the wound to heal. 1/30; the patient continues to have areas on the left plantar heel and the left plantar great toe. The more worrisome area is the heel. She went for MRI on Saturday but this could not be done because she had silver alginate in the wound bed. I would like to get this rebooked. If she does not have osteomyelitis she will need a total contact cast. We have been using silver alginate in the wounds 2/7; patient has wounds on her left plantar heel and left plantar great toe. The area on the heel has some depth although it looks about the same today. Her  MRI will finally be done  tomorrow. If she has osteomyelitis in the heel and then we will need to consider her for IV antibiotics and hyperbaric oxygen. If the MRI is negative she will need a total contact cast although she is wearing a cam boot and using a scooter at present. Will be using silver alginate. She will take it off tomorrow in preparation for the MRI 2/14; the patient's MRI showed extensive surgical changes with a large portion of her calcaneus removed on the left secondary to her previous surgery by Dr. Sharol Given however there is no evidence of osteomyelitis. She would therefore be a candidate for a total contact cast and we applied this for the first time today 2/21; silver collagen total contact cast. The area on the plantar left great toe is "healed" still a lot of callus on this area. Dimensions on the plantar heel not too much different some epithelialization is present however. This is an improvement 12/25/18 on evaluation today patient actually is seen for follow-up concerning issues that she has been having with her cast she states she feels like it's wet and squishy in the bottom of her cast. The reason she does come in to have this evaluated and see what is going on. Fortunately there's no signs of infection at this time was the cast was removed. 3/6; the patient's area on the tip of her right great toe is callused but there is no open area here. She still has the fairly extensive area in the left heel. We have been using silver collagen in the wound. 01/08/19 on evaluation today patient actually appears to be doing very well in regard to her heel ulcer in my pinion to see if you shown signs of improvement which is excellent news. There's no evidence of infection. She continues to have quite a bit of drainage but fortunately again this doesn't seem to be hindering her healing. 3/20 -Patient's foot ulcer on the left appears to be doing well, the dimensions appear encouraging, we  have been using total contact cast with Prisma and will continue doing that 3/27; patient continues to make nice progress on the left plantar heel. She is using silver collagen under a total contact cast. It is been a while since I have seen this wound and it really looks a lot better. Smaller with healthy granulation 4/3; she continues to make nice progress on the left plantar heel. Using silver collagen under a total contact cast 4/10; left plantar heel. Again skin over the surface of the wound with not much in the way of adherence. This results in undermining. Using silver collagen changed to silver alginate 4/17 left plantar heel. Again not much improvement. There is no undermining today no debridement was required we used silver alginate last time because of excessive moisture 4/24 left plantar heel. Again not as much improvement as I would have liked. About 3 mm of depth. Thick callused skin around the circumference. Using silver alginate 5/1; left plantar heel this is improved this week. Less depth epithelialization is present. Using silver alginate alginate under a total contact cast 5/8; left plantar heel dimensions are about the same. The depth appears to be improved. I use silver collagen starting today under the cast 5/15 left plantar heel. Arrives today with a 2-day history of feeling like something had "slipped" while walking her dog in the cast. Unfortunately extensive area relatively to the small wound of undermining superiorly denuded epithelium. 5/22-Patient returns at 1 week after being taken off the [facility] on account  of some fluid collection that was debrided and cultured at last visit from the plantar ulcer on the left heel. The culture results are polymicrobial with Klebsiella, enterococcus, Proteus growth these organisms are sensitive to Cipro except with enterococcus which is sensitive to ampicillin but patient is highly allergic to penicillin according to her. This wound  appears larger and there is a new small skin depth wound on the great toe plantar aspect patient does have hammertoes. 5/29; the patient arrived last week with a new wound on her left plantar great toe. With regards to the culture that I did 2 weeks ago of her deteriorating heel wound this grew Klebsiella and enterococcus. She was given Cipro however the enterococcus would not be covered well by a quinolone. She is allergic to penicillin. I will give her linezolid 600 twice daily x5 days 6/12; the patient continues to have a wound on her left and after she returns to the beach there is undermining laterally. She has the new wound from this 2 weeks or so ago on the plantar tip of her left great toe. She had a fall today scraping the dorsal surface of the left fifth 7/14; READMISSION since the patient was last here she was hospitalized from 04/15/2019 through 04/22/2019. She had presented to an outside ER after falling and hitting her elbow. She had had previous surgery on the elbow and fractures several years ago. An x-ray was negative and she was sent home. She is readmitted with sepsis and acute renal failure secondary to septic arthritis of the elbow. Cultures apparently showed pansensitive staph aureus however she has a severe beta-lactam allergy. She was treated with vancomycin. Apparently the vancomycin is completed and her PICC line is removed although she is seeing Dr. Megan Salon tomorrow due to continued pain and swelling. In the hospital she had an IandD by Dr. [name] of orthopedics. The original surgery was on 04/17/2019 and it was felt that she had septic arthritis. She is still having a lot of pain and swelling in the right elbow and apparently is due to see Dr. Megan Salon tomorrow and what she thinks is that she will be restarted on antibiotics. With regards to her heel wounds/left foot wounds. Her arterial studies were checked and her ABIs were within normal limits. X-ray showed plantar  foot ulcer negative for osteomyelitis postop resection of the posterior calcaneus. She has been using silver alginate to the heel 8/4-Patient returns after being seen on 7/14, we are using silver alginate to the calcaneal wound, she is continuing to receive care for her right elbow septic arthritis 8/14- Patient returns after 1 week, she is being seen by the surgical group for her right elbow, she is here for the left calcaneal wound for which we are using silver alginate this is about the same 8/21; this is a patient I readmitted to the clinic 5 weeks ago. She is continuing to have difficulties with a septic arthritis of the right elbow she had after a fall and apparently has had surgical IandD's since the last time we saw her. She is also following with Dr. Megan Salon of infectious disease and is apparently on 3 oral antibiotics although at the time of this dictation I am not sure what they are. She comes in today with a necrotic surface on the left great toe with a blister laterally. This was still clearly an open wound. She had purulent drainage coming out of this. The original wound on the plantar aspect of her right heel  in the setting of a Charcot foot is deep not open to bone but certainly not any better at all. We have been using silver alginate 8/28; dealing with septic arthritis of the right elbow. May need to go for further surgery here in the second week of September. She had a blister on the left great toe that was purulent [place] draining last week although a culture did not grow anything [already on antibiotics through infectious disease for her elbow]. The punched-out area on her heel is just like it was when she first came in. This almost closed with a total contact cast there are no options for that now. The patient states she cannot stay off her foot having to do housework X-ray of the foot showed a moderate bunion and severe degenerative changes at the first metatarsal phalangeal  joint there was a moderate plantar calcaneal spur. We will need to check the location of this. 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 It is oral. We are using silver collagen to the punched-out area on her left heel and to the summer superficial area of the left great toe. She is offloading this is best she can although judging by the amount of callus it is not enough. She thinks she has enough strength in her right elbow now to use her scooter. 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 Hale Bogus notes and infectious disease. We are using silver collagen to the punched-out areas on her left heel and to the plantar aspect of her left great toe she is now using her scooter to get around and to help offload these areas 10/2; 2-week follow-up. Still on clindamycin I believe for the left elbow she is going for an MRI of the elbow over the weekend. She is then going to see orthopedics and infectious disease. The area on the left heel is certainly no better. This does not probe to bone however there is green drainage. She has an area on the tip of her toe. The area on the heel is in a wound we almost closed at one point with total contact casting. 10/9; one-week follow-up. Culture I did of the heel last week which was a swab culture admittedly showed moderate Enterococcus faecalis moderate Pseudomonas. The wound itself looks about the same. There is no palpable bone although the depth of it closely approximates bone. The heel is swollen but not erythematous. Her MRI is booked for next week. She also has an MRI of the right elbow. I've also lifted the last consult from Dr. Megan Salon  who is following her for septic arthritis of the right elbow. I did not see him specifically comment on her left heel however the patient states that he is aware of this. I did not specifically address the organisms I cultured last week because of the possible effect of any additional cultures that will be done on the elbow. Additionally these were superficial not bone cultures 10/16; her MRI of the heel was put back to next week. She did have her elbow done. I have been in contact with Dr. Megan Salon about my concerns about osteomyelitis of the  left heel. We have been using collagen. She also has a wound on the plantar tip of her first toe 10/23; her MRI of the heel was negative for osteomyelitis but suggested a small abscess. She also had an MRI of her elbow that showed findings compatible with a septic joint. She went to see Dr. Megan Salon and she is going to have both oral and IV antibiotics which I am sure will cover any infection in the heel. I did not actually see his note today. We are using silver collagen offloading the heel with a heel offloading boot Electronic Signature(s) Signed: 08/22/2019 6:06:00 PM By: Linton Ham MD Entered By: Linton Ham on 08/22/2019 17:46:58 -------------------------------------------------------------------------------- Physical Exam Details Patient Name: Date of Service: IVYANA, LOCEY 08/22/2019 4:00 PM Medical Record [id number] Patient Account Number: [id number] Date of Birth/Sex: Treating RN: 1956-06-06 (63 y.o. [name] [name] Primary Care Provider: [name] Other Clinician: Referring Provider: Treating Provider/Extender:Robson, Lennie Odor CHASE, Tora Kindred in Treatment: 99 Constitutional Patient is hypertensive.. Pulse regular and within target range for patient.[name] Kitchen Respirations regular, non-labored and within target range.. Temperature is normal and within the target range for the patient.[name] Kitchen Appears in no  distress. Notes Wound exam; still a deep punched out area on the plantar left heel. Some improvement in dimensions. Thick callus around the circumference removed with a #5 curette fibrinous adherent debris from the wound surface hemostasis with silver nitrate. There is no surrounding erythema Electronic Signature(s) Signed: 08/22/2019 6:06:00 PM By: Linton Ham MD Entered By: Linton Ham on 08/22/2019 17:50:26 -------------------------------------------------------------------------------- Physician Orders Details Patient Name: Date of Service: ZEYNAB, KLETT 08/22/2019 4:00 PM Medical Record [id number] Patient Account Number: [id number] Date of Birth/Sex: Treating RN: 03-30-1956 (63 y.o. 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 Dressing Change Frequency Wound #3 Left Calcaneus Change Dressing every other day. Wound #6 Left Toe Great Change Dressing every other day. Wound Cleansing Wound #3 Left Calcaneus May shower and wash wound with soap and water. Wound #6 Left Toe Great May shower and wash wound with soap and water. Primary Wound Dressing Wound #3 Left Calcaneus Silver Collagen - to line wound bed, use saline moistened gauze to fill space behind collagen Wound #6 Left Toe Great Silver Collagen - moisten with saline Secondary Dressing Wound #3 Left Calcaneus Kerlix/Rolled Gauze Dry Gauze Other: - felt callous pad Wound #6 Left Toe Great Kerlix/Rolled Gauze Dry  Gauze Other: - felt callous pad or foam donut Edema Control Elevate legs to the level of the heart or above for 30 minutes daily and/or when sitting, a frequency of: - throughout the day Off-Loading Other: - use knee scooter or motorized chair , minimal weight bearing left foot Electronic Signature(s) Signed: 08/22/2019 5:59:58 PM By: [name] RN, BSN Signed: 08/22/2019 6:06:00 PM By: Linton Ham MD Entered By: [name] on 08/22/2019 17:31:09 -------------------------------------------------------------------------------- Problem List Details Patient Name: Date of Service: AHNIYAH, GIANCOLA 08/22/2019 4:00 PM Medical Record [id number] Patient Account Number: [id number] Date of Birth/Sex: Treating RN: 12/09/1955 (63 y.o. [name] [name] Primary Care Provider: [name] Other Clinician: Referring Provider:  Treating Provider/Extender:Robson, Enedina Finner Weeks in Treatment: 109 Active Problems ICD-10 Evaluated Encounter Code Description Active Date Today Diagnosis L97.423 Non-pressure chronic ulcer of left heel and midfoot 11/12/2018 No Yes with necrosis of muscle L97.522 Non-pressure chronic ulcer of other part of left foot 11/12/2018 No Yes with fat layer exposed E11.621 Type 2 diabetes mellitus with foot ulcer 11/12/2018 No Yes Inactive Problems Resolved Problems Electronic Signature(s) Signed: 08/22/2019 6:06:00 PM By: Linton Ham MD Entered By: Linton Ham on 08/22/2019 17:43:26 -------------------------------------------------------------------------------- Progress Note Details Patient Name: Date of Service: [name] [name] 08/22/2019 4:00 PM Medical Record [id number] Patient Account Number: [id number] Date of Birth/Sex: Treating RN: 26-Oct-1956 (63 y.o. [name] [name] Primary Care Provider: [name] Other Clinician: Referring Provider: Treating Provider/Extender:Robson, Enedina Finner Weeks in Treatment: 40 Subjective History of Present Illness (HPI) ADMISSION 11/12/2018 This is a 63 year old woman with type 2 diabetes and diabetic neuropathy. She has been dealing with a left heel plantar wound for roughly 2 years. She states this started when she pulled some skin off the area and it progressed into a wound. She also has an area on the tip of her left great toe for 1 year. She has been largely followed by Dr. Sharol Given and she had an excision of bone in the left heel in 2017. I do not see microbiology from this excision or pathology. Apparently this wound never really closed. She most recently has been using Silvadene cream. Offloading this with a scooter. The last MRI I see was in June 2018 which did not show osteomyelitis at that time. Apparently this wound is never really progressed towards healing. During her last review by Dr. Sharol Given in October it was recommended that she undergo a left BKA and she refused. She went to see a second orthopedic consult at [facility] who recommended conservative wound care to see if this will close or progress towards closure but also warned that possible surgery may be necessary. An x-ray that was done at [facility] showed an irregular calcaneal body and calcaneal tuberosity. This is probably postprocedural. Previous x-rays at [facility] had suggested heterotrophic calcifications but I do not see this. Apparently a second ointment was added to the Silvadene which the patient thinks is because some improvement. She has not been systemically unwell. No fever or chills. She thinks the second ointment that was given to her at [facility] has helped somewhat. 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 ABIs done in June 2018 showed a ABI of 1.12 on the right and 1.1 on the left she was biphasic bilaterally. ABIs in our clinic were noncompressible today. 11/20/18 on evaluation today patient is seen for her second visit here in the office although this is actually the first visit with me concerning an issue that she's having with her great toe and heel of the left foot. Fortunately she does not appear to be have any discomfort at this time and she does have a scooter in order to offload her foot she also has a boot for offloading. With that being said this is something that appears to have been going on for some time she was seeing Dr. due to his recommendation  was that she was going to require a below knee amputation. With that being said she wanted to come to the wound center but according to the patient Dr. Sharol Given told her that "we could not help her". Nonetheless she saw another provider who suggested that it may be worth a shot for [name] to try and help her out if it all possible. Nonetheless she decided that it would be worth a trial since otherwise any the way she's gonna end up with an amputation. Obviously if we can heal the wound then that will not be the case. Again I explained to her that obviously there are no guarantees but we will give this a good try and attempts to get the wound to heal. 1/30; the patient continues to have areas on the left plantar heel and the left plantar great toe. The more worrisome area is the heel. She went for MRI on Saturday but this could not be done because she had silver alginate in the wound bed. I would like to get this rebooked. If she does not have osteomyelitis she will need a total contact cast. We have been using silver alginate in the wounds 2/7; patient has wounds on her left plantar heel and left plantar great toe. The area on the heel has some depth although it looks about the same today. Her MRI  will finally be done tomorrow. If she has osteomyelitis in the heel and then we will need to consider her for IV antibiotics and hyperbaric oxygen. If the MRI is negative she will need a total contact cast although she is wearing a cam boot and using a scooter at present. Will be using silver alginate. She will take it off tomorrow in preparation for the MRI 2/14; the patient's MRI showed extensive surgical changes with a large portion of her calcaneus removed on the left secondary to her previous surgery by Dr. Sharol Given however there is no evidence of osteomyelitis. She would therefore be a candidate for a total contact cast and we applied this for the first time today 2/21; silver collagen total contact cast. The area on the plantar left great toe is "healed" still a lot of callus on this area. Dimensions on the plantar heel not too much different some epithelialization is present however. This is an improvement 12/25/18 on evaluation today patient actually is seen for follow-up concerning issues that she has been having with her cast she states she feels like it's wet and squishy in the bottom of her cast. The reason she does come in to have this evaluated and see what is going on. Fortunately there's no signs of infection at this time was the cast was removed. 3/6; the patient's area on the tip of her right great toe is callused but there is no open area here. She still has the fairly extensive area in the left heel. We have been using silver collagen in the wound. 01/08/19 on evaluation today patient actually appears to be doing very well in regard to her heel ulcer in my pinion to see if you shown signs of improvement which is excellent news. There's no evidence of infection. She continues to have quite a bit of drainage but fortunately again this doesn't seem to be hindering her healing. 3/20 -Patient's foot ulcer on the left appears to be doing well, the dimensions appear encouraging, we have  been using total contact cast with Prisma and will continue doing that 3/27; patient continues to make nice progress on  the left plantar heel. She is using silver collagen under a total contact cast. It is been a while since I have seen this wound and it really looks a lot better. Smaller with healthy granulation 4/3; she continues to make nice progress on the left plantar heel. Using silver collagen under a total contact cast 4/10; left plantar heel. Again skin over the surface of the wound with not much in the way of adherence. This results in undermining. Using silver collagen changed to silver alginate 4/17 left plantar heel. Again not much improvement. There is no undermining today no debridement was required we used silver alginate last time because of excessive moisture 4/24 left plantar heel. Again not as much improvement as I would have liked. About 3 mm of depth. Thick callused skin around the circumference. Using silver alginate 5/1; left plantar heel this is improved this week. Less depth epithelialization is present. Using silver alginate alginate under a total contact cast 5/8; left plantar heel dimensions are about the same. The depth appears to be improved. I use silver collagen starting today under the cast 5/15 left plantar heel. Arrives today with a 2-day history of feeling like something had "slipped" while walking her dog in the cast. Unfortunately extensive area relatively to the small wound of undermining superiorly denuded epithelium. 5/22-Patient returns at 1 week after being taken off the [facility] on account of some fluid collection that was debrided and cultured at last visit from the plantar ulcer on the left heel. The culture results are polymicrobial with Klebsiella, enterococcus, Proteus growth these organisms are sensitive to Cipro except with enterococcus which is sensitive to ampicillin but patient is highly allergic to penicillin according to her. This wound appears  larger and there is a new small skin depth wound on the great toe plantar aspect patient does have hammertoes. 5/29; the patient arrived last week with a new wound on her left plantar great toe. With regards to the culture that I did 2 weeks ago of her deteriorating heel wound this grew Klebsiella and enterococcus. She was given Cipro however the enterococcus would not be covered well by a quinolone. She is allergic to penicillin. I will give her linezolid 600 twice daily x5 days 6/12; the patient continues to have a wound on her left and after she returns to the beach there is undermining laterally. She has the new wound from this 2 weeks or so ago on the plantar tip of her left great toe. She had a fall today scraping the dorsal surface of the left fifth 7/14; READMISSION since the patient was last here she was hospitalized from 04/15/2019 through 04/22/2019. She had presented to an outside ER after falling and hitting her elbow. She had had previous surgery on the elbow and fractures several years ago. An x-ray was negative and she was sent home. She is readmitted with sepsis and acute renal failure secondary to septic arthritis of the elbow. Cultures apparently showed pansensitive staph aureus however she has a severe beta-lactam allergy. She was treated with vancomycin. Apparently the vancomycin is completed and her PICC line is removed although she is seeing Dr. Megan Salon tomorrow due to continued pain and swelling. In the hospital she had an IandD by Dr. [name] of orthopedics. The original surgery was on 04/17/2019 and it was felt that she had septic arthritis. She is still having a lot of pain and swelling in the right elbow and apparently is due to see Dr. Megan Salon tomorrow and what she  thinks is that she will be restarted on antibiotics. With regards to her heel wounds/left foot wounds. Her arterial studies were checked and her ABIs were within normal limits. X-ray showed plantar foot  ulcer negative for osteomyelitis postop resection of the posterior calcaneus. She has been using silver alginate to the heel 8/4-Patient returns after being seen on 7/14, we are using silver alginate to the calcaneal wound, she is continuing to receive care for her right elbow septic arthritis 8/14- Patient returns after 1 week, she is being seen by the surgical group for her right elbow, she is here for the left calcaneal wound for which we are using silver alginate this is about the same 8/21; this is a patient I readmitted to the clinic 5 weeks ago. She is continuing to have difficulties with a septic arthritis of the right elbow she had after a fall and apparently has had surgical IandD's since the last time we saw her. She is also following with Dr. Megan Salon of infectious disease and is apparently on 3 oral antibiotics although at the time of this dictation I am not sure what they are. She comes in today with a necrotic surface on the left great toe with a blister laterally. This was still clearly an open wound. She had purulent drainage coming out of this. The original wound on the plantar aspect of her right heel in the setting of a Charcot foot is deep not open to bone but certainly not any better at all. We have been using silver alginate 8/28; dealing with septic arthritis of the right elbow. May need to go for further surgery here in the second week of September. She had a blister on the left great toe that was purulent [place] draining last week although a culture did not grow anything [already on antibiotics through infectious disease for her elbow]. The punched-out area on her heel is just like it was when she first came in. This almost closed with a total contact cast there are no options for that now. The patient states she cannot stay off her foot having to do housework X-ray of the foot showed a moderate bunion and severe degenerative changes at the first metatarsal phalangeal  joint there was a moderate plantar calcaneal spur. We will need to check the location of this. 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 It is oral. We are using silver collagen to the punched-out area on her left heel and to the summer superficial area of the left great toe. She is offloading this is best she can although judging by the amount of callus it is not enough. She thinks she has enough strength in her right elbow now to use her scooter. 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 Hale Bogus notes and infectious disease. We are using silver collagen to the punched-out areas on her left heel and to the plantar aspect of her left great toe she is now using her scooter to get around and to help offload these areas 10/2; 2-week follow-up. Still on clindamycin I believe for the left elbow she is going for an MRI of the elbow  over the weekend. She is then going to see orthopedics and infectious disease. The area on the left heel is certainly no better. This does not probe to bone however there is green drainage. She has an area on the tip of her toe. The area on the heel is in a wound we almost closed at one point with total contact casting. 10/9; one-week follow-up. Culture I did of the heel last week which was a swab culture admittedly showed moderate Enterococcus faecalis moderate Pseudomonas. The wound itself looks about the same. There is no palpable bone although the depth of it closely approximates bone. The heel is swollen but not erythematous. Her MRI is booked for next week. She also has an MRI of the right elbow. I've also lifted the last consult from Dr. Megan Salon  who is following her for septic arthritis of the right elbow. I did not see him specifically comment on her left heel however the patient states that he is aware of this. I did not specifically address the organisms I cultured last week because of the possible effect of any additional cultures that will be done on the elbow. Additionally these were superficial not bone cultures 10/16; her MRI of the heel was put back to next week. She did have her elbow done. I have been in contact with Dr. Megan Salon about my concerns about osteomyelitis of the left heel. We have been using collagen. She also has a wound on the plantar tip of her first toe 10/23; her MRI of the heel was negative for osteomyelitis but suggested a small abscess. She also had an MRI of her elbow that showed findings compatible with a septic joint. She went to see Dr. Megan Salon and she is going to have both oral and IV antibiotics which I am sure will cover any infection in the heel. I did not actually see his note today. We are using silver collagen offloading the heel with a heel offloading boot Objective Constitutional Patient is hypertensive.. Pulse regular and within target range for patient.[name] Kitchen Respirations regular, non-labored and within target range.. Temperature is normal and within the target range for the patient.[name] Kitchen Appears in no distress. Vitals Time Taken: 4:59 PM, Height: 62 in, Weight: 180 lbs, BMI: 32.9, Temperature: 98.5 F, Pulse: 73 bpm, Respiratory Rate: 18 breaths/min, Blood Pressure: 159/76 mmHg. General Notes: Wound exam; still a deep punched out area on the plantar left heel. Some improvement in dimensions. Thick callus around the circumference removed with a #5 curette fibrinous adherent debris from the wound surface hemostasis with silver nitrate. There is no surrounding erythema Integumentary (Hair, Skin) Wound #3 status is Open. Original cause of wound was Trauma. The wound is located on the Left Calcaneus.  The wound measures 2.5cm length x 2cm width x 0.7cm depth; 3.927cm^2 area and 2.749cm^3 volume. There is Fat Layer (Subcutaneous Tissue) Exposed exposed. There is no tunneling or undermining noted. There is a medium amount of serosanguineous drainage noted. The wound margin is flat and intact. There is medium (34-66%) pink granulation within the wound bed. There is a medium (34-66%) amount of necrotic tissue within the wound bed including Adherent Slough. Wound #6 status is Open. Original cause of wound was Blister. The wound is located on the Left Toe Great. The wound measures 0.2cm length x 0.2cm width x 0.2cm depth; 0.031cm^2 area and 0.006cm^3 volume. There is Fat Layer (Subcutaneous Tissue) Exposed exposed. There is no tunneling or undermining noted. There is a small amount of serosanguineous drainage  noted. The wound margin is flat and intact. There is large (67-100%) pink granulation within the wound bed. There is no necrotic tissue within the wound bed. 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 There was a Excisional Skin/Subcutaneous Tissue Debridement with a total area of 5 sq cm performed by Ricard Dillon., MD. With the following instrument(s): Curette to remove Viable and Non-Viable tissue/material. Material removed includes Callus and Subcutaneous Tissue and. No specimens were taken. A time out was conducted at 17:26, prior to the start of the procedure. A Minimum amount of bleeding was controlled with Pressure. The procedure was tolerated well with a pain level of 0 throughout and a pain level of 0  following the procedure. Post Debridement Measurements: 2.5cm length x 2cm width x 0.7cm depth; 2.749cm^3 volume. Character of Wound/Ulcer Post Debridement is improved. Severity of Tissue Post Debridement is: Fat layer exposed. Post procedure Diagnosis Wound #3: Same as Pre-Procedure Wound #6 Pre-procedure diagnosis of Wound #6 is a Diabetic Wound/Ulcer of the Lower Extremity located on the Left Toe Great .Severity of Tissue Pre Debridement is: Fat layer exposed. There was a Excisional Skin/Subcutaneous Tissue Debridement with a total area of 0.04 sq cm performed by Ricard Dillon., MD. With the following instrument(s): Curette to remove Viable and Non-Viable tissue/material. Material removed includes Callus and Subcutaneous Tissue and. No specimens were taken. A time out was conducted at 17:26, prior to the start of the procedure. A Minimum amount of bleeding was controlled with Pressure. The procedure was tolerated well with a pain level of 0 throughout and a pain level of 0 following the procedure. Post Debridement Measurements: 0.2cm length x 0.2cm width x 0.2cm depth; 0.006cm^3 volume. Character of Wound/Ulcer Post Debridement is improved. Severity of Tissue Post Debridement is: Fat layer exposed. Post procedure Diagnosis Wound #6: Same as Pre-Procedure Plan Follow-up Appointments: Return Appointment in 2 weeks. Dressing Change Frequency: Wound #3 Left Calcaneus: Change Dressing every other day. Wound #6 Left Toe Great: Change Dressing every other day. Wound Cleansing: Wound #3 Left Calcaneus: May shower and wash wound with soap and water. Wound #6 Left Toe Great: May shower and wash wound with soap and water. Primary Wound Dressing: Wound #3 Left Calcaneus: Silver Collagen - to line wound bed, use saline moistened gauze to fill space behind collagen Wound #6 Left Toe Great: Silver Collagen - moisten with saline Secondary Dressing: Wound #3 Left Calcaneus: Kerlix/Rolled  Gauze Dry Gauze Other: - felt callous pad Wound #6 Left Toe Great: Kerlix/Rolled Gauze Dry Gauze Other: - felt callous pad or foam donut Edema Control: Elevate legs to the level of the heart or above for 30 minutes daily and/or when sitting, a frequency of: - throughout the day Off-Loading: Other: - use knee scooter or motorized chair , minimal weight bearing left foot 1. Left calcaneus continue silver collagen 2. Left first toe also debrided with a #3 curette continue silver collagen here as well 3. Continue offloading. 4. Look for additional benefits of the IV and oral antibiotics targeted at her septic arthritis in the elbow. 5. If there is not an option to more aggressively offload these areas. Electronic Signature(s)  Signed: 08/22/2019 6:06:00 PM By: Linton Ham MD Entered By: Linton Ham on 08/22/2019 17:51:20 -------------------------------------------------------------------------------- SuperBill Details Patient Name: Date of Service: RENN, STILLE 08/22/2019 Medical Record [id number] Patient Account Number: [id number] Date of Birth/Sex: Treating RN: 1956/08/14 (63 y.o. 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 L97.522 Non-pressure chronic ulcer of other part of left foot with fat layer exposed E11.621 Type 2 diabetes mellitus with foot ulcer Facility Procedures CPT4 Code Description: [id number] - DEB SUBQ TISSUE 20 SQ CM/< ICD-10 Diagnosis Description L97.423 Non-pressure chronic ulcer of left heel and midfoot with necro L97.522 Non-pressure chronic ulcer of other part of left foot with fat Modifier: sis of muscle layer exposed Quantity: 1 Physician Procedures CPT4 Code  Description: [id number] - WC PHYS SUBQ TISS 20 SQ CM ICD-10 Diagnosis Description L97.423 Non-pressure chronic ulcer of left heel and midfoot with necrosis L97.522 Non-pressure chronic ulcer of other part of left foot with fat la Modifier: 1 of muscle yer exposed Quantity: Electronic Signature(s) Signed: 08/22/2019 6:06:00 PM By: Linton Ham MD Entered By: Linton Ham on 08/22/2019 17:51:38

## 2019-08-24 ENCOUNTER — Encounter: Admit: 2019-08-24 | Discharge: 2019-08-24 | Payer: MEDICARE

## 2019-08-24 NOTE — Telephone Encounter
Call Rhonda Fletcher about her lab results-    The A1C was 6.4 %, showing that her diabetes is in great control.    She is still mildly anemic, and her iron levels are still low.  I would like her to consider getting some IV iron infusions to get her iron levels up.  Is she interested in these?    Her kidney test (Creatinine) was a little bit higher.  To protect her kidneys, she needs to drink plenty of water and avoid over the counter pain medications such as Aleve and Ibuprofen.  Tylenol is ok to take.    I will send her the results in the mail.

## 2019-08-25 ENCOUNTER — Encounter: Admit: 2019-08-25 | Discharge: 2019-08-25 | Payer: MEDICARE

## 2019-08-25 MED ORDER — FERRIC CARBOXYMALTOSE IVPB
750 mg | Freq: Once | INTRAVENOUS | 0 refills | Status: CN
Start: 2019-08-25 — End: ?

## 2019-08-26 ENCOUNTER — Ambulatory Visit: Payer: PRIVATE HEALTH INSURANCE | Admitting: Internal Medicine

## 2019-08-27 ENCOUNTER — Other Ambulatory Visit (HOSPITAL_COMMUNITY): Payer: Self-pay | Admitting: *Deleted

## 2019-08-28 ENCOUNTER — Other Ambulatory Visit: Payer: Self-pay

## 2019-08-28 ENCOUNTER — Other Ambulatory Visit: Payer: Self-pay | Admitting: Internal Medicine

## 2019-08-28 ENCOUNTER — Encounter (HOSPITAL_COMMUNITY)
Admission: RE | Admit: 2019-08-28 | Discharge: 2019-08-28 | Disposition: A | Discharge status: Home / Self Care | Payer: PRIVATE HEALTH INSURANCE | Source: Ambulatory Visit | Attending: Nephrology | Admitting: Nephrology

## 2019-08-28 ENCOUNTER — Encounter (HOSPITAL_COMMUNITY): Payer: Self-pay | Admitting: Interventional Radiology

## 2019-08-28 ENCOUNTER — Ambulatory Visit (HOSPITAL_COMMUNITY)
Admission: RE | Admit: 2019-08-28 | Discharge: 2019-08-28 | Disposition: A | Discharge status: Home / Self Care | Payer: PRIVATE HEALTH INSURANCE | Source: Ambulatory Visit | Attending: Internal Medicine | Admitting: Internal Medicine

## 2019-08-28 DIAGNOSIS — M00021 Staphylococcal arthritis, right elbow: Secondary | ICD-10-CM | POA: Diagnosis present

## 2019-08-28 HISTORY — PX: IR US GUIDE VASC ACCESS RIGHT: IMG2390

## 2019-08-28 HISTORY — PX: IR FLUORO GUIDE CV LINE RIGHT: IMG2283

## 2019-08-28 MED ORDER — HEPARIN SOD (PORK) LOCK FLUSH 100 UNIT/ML IV SOLN
500.0000 [IU] | Freq: Once | INTRAVENOUS | Status: AC
  Administered 2019-08-28: 13:00:00 500 [IU] via INTRAVENOUS

## 2019-08-28 MED ORDER — SODIUM CHLORIDE 0.9 % IV SOLN
510.0000 mg | INTRAVENOUS | Status: DC
  Administered 2019-08-28: 510 mg via INTRAVENOUS
  Filled 2019-08-28: qty 17

## 2019-08-28 MED ORDER — HEPARIN SOD (PORK) LOCK FLUSH 100 UNIT/ML IV SOLN
INTRAVENOUS | Status: AC
  Administered 2019-08-28: 13:00:00 500 [IU] via INTRAVENOUS
  Filled 2019-08-28: qty 5

## 2019-08-28 MED ORDER — LIDOCAINE HCL 1 % IJ SOLN
INTRAMUSCULAR | Status: DC | PRN
  Administered 2019-08-28: 10 mL

## 2019-08-28 MED ORDER — LIDOCAINE HCL 1 % IJ SOLN
INTRAMUSCULAR | Status: AC
  Filled 2019-08-28: qty 20

## 2019-08-28 MED ORDER — SODIUM CHLORIDE 0.9 % IV SOLN
8.0000 mg/kg | Freq: Once | INTRAVENOUS | Status: AC
  Administered 2019-08-28: 661 mg via INTRAVENOUS
  Filled 2019-08-28: qty 13.22

## 2019-08-28 NOTE — Discharge Instructions (Signed)
Daptomycin injection What is this medicine? DAPTOMYCIN (DAP toe MYE sin) is a lipopeptide antibiotic. It is used to treat certain kinds of bacterial infections. It will not work for colds, flu, or other viral infections. This medicine may be used for other purposes; ask your health care provider or pharmacist if you have questions. COMMON BRAND NAME(S): Cubicin, Cubicin RF What should I tell my health care provider before I take this medicine? They need to know if you have any of these conditions:  kidney disease  an unusual or allergic reaction to daptomycin, other medicines, foods, dyes, or preservatives  pregnant or trying to get pregnant  breast-feeding How should I use this medicine? This medicine is for infusion into a vein. It is usually given by a health care professional in a hospital or clinic setting. If you get this medicine at home, you will be taught how to prepare and give this medicine. Use exactly as directed. Take your medicine at regular intervals. Do not take your medicine more often than directed. Take all of your medicine as directed even if you think you are better. Do not skip doses or stop your medicine early. It is important that you put your used needles and syringes in a special sharps container. Do not put them in a trash can. If you do not have a sharps container, call your pharmacist or healthcare provider to get one. Talk to your pediatrician regarding the use of this medicine in children. While this drug may be prescribed for children as young as 1 year for selected conditions, precautions do apply. Overdosage: If you think you have taken too much of this medicine contact a poison control center or emergency room at once. NOTE: This medicine is only for you. Do not share this medicine with others. What if I miss a dose? If you miss a dose, take it as soon as you can. If it is almost time for your next dose, take only that dose. Do not take double or extra  doses. What may interact with this medicine?  birth control pills  some antibiotics like tobramycin This list may not describe all possible interactions. Give your health care provider a list of all the medicines, herbs, non-prescription drugs, or dietary supplements you use. Also tell them if you smoke, drink alcohol, or use illegal drugs. Some items may interact with your medicine. What should I watch for while using this medicine? Your condition will be monitored carefully while you are receiving this medicine. Do not treat diarrhea with over the counter products. Contact your doctor if you have diarrhea that lasts more than 2 days or if it is severe and watery. What side effects may I notice from receiving this medicine? Side effects that you should report to your doctor or health care professional as soon as possible:  allergic reactions like skin rash, itching or hives, swelling of the face, lips, or tongue  breathing problems  fever, infection  high or low blood pressure  muscle pain  numb or tingling pain  trouble passing urine or change in the amount of urine  unusually tired or weak  vomiting Side effects that usually do not require medical attention (report to your doctor or health care professional if they continue or are bothersome):  constipation or diarrhea  trouble sleeping  headache  nausea  stomach upset This list may not describe all possible side effects. Call your doctor for medical advice about side effects. You may report side effects to  FDA at 1-800-FDA-1088. Where should I keep my medicine? Keep out of the reach of children. If you are using this medicine at home, you will be instructed on how to store this medicine. Throw away any unused medicine after the expiration date on the label. NOTE: This sheet is a summary. It may not cover all possible information. If you have questions about this medicine, talk to your doctor, pharmacist, or health  care provider.  2020 [employer]/Gold Standard (2016-01-28 11:21:16)    Ferumoxytol injection What is this medicine? FERUMOXYTOL is an iron complex. Iron is used to make healthy red blood cells, which carry oxygen and nutrients throughout the body. This medicine is used to treat iron deficiency anemia. This medicine may be used for other purposes; ask your health care provider or pharmacist if you have questions. COMMON BRAND NAME(S): Feraheme What should I tell my health care provider before I take this medicine? They need to know if you have any of these conditions:  anemia not caused by low iron levels  high levels of iron in the blood  magnetic resonance imaging (MRI) test scheduled  an unusual or allergic reaction to iron, other medicines, foods, dyes, or preservatives  pregnant or trying to get pregnant  breast-feeding How should I use this medicine? This medicine is for injection into a vein. It is given by a health care professional in a hospital or clinic setting. Talk to your pediatrician regarding the use of this medicine in children. Special care may be needed. Overdosage: If you think you have taken too much of this medicine contact a poison control center or emergency room at once. NOTE: This medicine is only for you. Do not share this medicine with others. What if I miss a dose? It is important not to miss your dose. Call your doctor or health care professional if you are unable to keep an appointment. What may interact with this medicine? This medicine may interact with the following medications:  other iron products This list may not describe all possible interactions. Give your health care provider a list of all the medicines, herbs, non-prescription drugs, or dietary supplements you use. Also tell them if you smoke, drink alcohol, or use illegal drugs. Some items may interact with your medicine. What should I watch for while using this medicine? Visit your doctor  or healthcare professional regularly. Tell your doctor or healthcare professional if your symptoms do not start to get better or if they get worse. You may need blood work done while you are taking this medicine. You may need to follow a special diet. Talk to your doctor. Foods that contain iron include: whole grains/cereals, dried fruits, beans, or peas, leafy green vegetables, and organ meats (liver, kidney). What side effects may I notice from receiving this medicine? Side effects that you should report to your doctor or health care professional as soon as possible:  allergic reactions like skin rash, itching or hives, swelling of the face, lips, or tongue  breathing problems  changes in blood pressure  feeling faint or lightheaded, falls  fever or chills  flushing, sweating, or hot feelings  swelling of the ankles or feet Side effects that usually do not require medical attention (report to your doctor or health care professional if they continue or are bothersome):  diarrhea  headache  nausea, vomiting  stomach pain This list may not describe all possible side effects. Call your doctor for medical advice about side effects. You may report side effects  to FDA at 1-800-FDA-1088. Where should I keep my medicine? This drug is given in a hospital or clinic and will not be stored at home. NOTE: This sheet is a summary. It may not cover all possible information. If you have questions about this medicine, talk to your doctor, pharmacist, or health care provider.  2020 [employer]vier/Gold Standard (2016-12-04 20:21:10)

## 2019-08-28 NOTE — Procedures (Signed)
  Procedure: R IJ tunneled SL CVC   EBL:   minimal Complications:  none immediate  See full dictation in [facility].  Dillard Cannon MD Main # [phone] Pager  [phone]

## 2019-09-01 ENCOUNTER — Other Ambulatory Visit (HOSPITAL_COMMUNITY)
Admission: RE | Admit: 2019-09-01 | Discharge: 2019-09-01 | Disposition: A | Discharge status: Home / Self Care | Payer: PRIVATE HEALTH INSURANCE | Source: Other Acute Inpatient Hospital | Attending: Internal Medicine | Admitting: Internal Medicine

## 2019-09-01 DIAGNOSIS — M86171 Other acute osteomyelitis, right ankle and foot: Secondary | ICD-10-CM | POA: Diagnosis not present

## 2019-09-01 DIAGNOSIS — E11621 Type 2 diabetes mellitus with foot ulcer: Secondary | ICD-10-CM | POA: Insufficient documentation

## 2019-09-01 DIAGNOSIS — T847XXA Infection and inflammatory reaction due to other internal orthopedic prosthetic devices, implants and grafts, initial encounter: Secondary | ICD-10-CM | POA: Insufficient documentation

## 2019-09-01 DIAGNOSIS — Y838 Other surgical procedures as the cause of abnormal reaction of the patient, or of later complication, without mention of misadventure at the time of the procedure: Secondary | ICD-10-CM | POA: Diagnosis not present

## 2019-09-01 DIAGNOSIS — N390 Urinary tract infection, site not specified: Secondary | ICD-10-CM | POA: Insufficient documentation

## 2019-09-01 LAB — CBC WITH DIFFERENTIAL/PLATELET
Abs Immature Granulocytes: 0.02 K/uL (ref 0.00–0.07)
Basophils Absolute: 0 K/uL (ref 0.0–0.1)
Basophils Relative: 1 %
Eosinophils Absolute: 0.1 K/uL (ref 0.0–0.5)
Eosinophils Relative: 3 %
HCT: 33.1 % — ABNORMAL LOW (ref 36.0–46.0)
Hemoglobin: 10.2 g/dL — ABNORMAL LOW (ref 12.0–15.0)
Immature Granulocytes: 0 %
Lymphocytes Relative: 28 %
Lymphs Abs: 1.4 K/uL (ref 0.7–4.0)
MCH: 26.5 pg (ref 26.0–34.0)
MCHC: 30.8 g/dL (ref 30.0–36.0)
MCV: 86 fL (ref 80.0–100.0)
Monocytes Absolute: 0.5 K/uL (ref 0.1–1.0)
Monocytes Relative: 9 %
Neutro Abs: 2.9 K/uL (ref 1.7–7.7)
Neutrophils Relative %: 59 %
Platelets: 128 K/uL — ABNORMAL LOW (ref 150–400)
RBC: 3.85 MIL/uL — ABNORMAL LOW (ref 3.87–5.11)
RDW: 15.1 % (ref 11.5–15.5)
WBC: 4.9 K/uL (ref 4.0–10.5)
nRBC: 0 % (ref 0.0–0.2)

## 2019-09-01 LAB — BASIC METABOLIC PANEL
Anion gap: 9 (ref 5–15)
BUN: 22 mg/dL (ref 8–23)
CO2: 23 mmol/L (ref 22–32)
Calcium: 8.7 mg/dL — ABNORMAL LOW (ref 8.9–10.3)
Chloride: 104 mmol/L (ref 98–111)
Creatinine, Ser: 1.87 mg/dL — ABNORMAL HIGH (ref 0.44–1.00)
GFR calc Af Amer: 33 mL/min — ABNORMAL LOW (ref >60)
GFR calc non Af Amer: 28 mL/min — ABNORMAL LOW (ref >60)
Glucose, Bld: 439 mg/dL — ABNORMAL HIGH (ref 70–99)
Potassium: 4.5 mmol/L (ref 3.5–5.1)
Sodium: 136 mmol/L (ref 135–145)

## 2019-09-01 LAB — CK: Total CK: 132 U/L (ref 38–234)

## 2019-09-01 LAB — SEDIMENTATION RATE: Sed Rate: 48 mm/h — ABNORMAL HIGH (ref 0–22)

## 2019-09-01 LAB — C-REACTIVE PROTEIN: CRP: 1 mg/dL — ABNORMAL HIGH

## 2019-09-02 ENCOUNTER — Telehealth: Payer: Self-pay

## 2019-09-02 NOTE — Telephone Encounter (Signed)
Voicemail from pharmacy: Patient states she is having very bad diarrhea and nausea since starting Daptomycin.  The diarrhea is so bad she is not sure how much more she can take.    I spoke with patient who reports loose watery stools at least 3-4 times daily with nausea and headache present 1 1/2 hours after medications and is relieved with liquid tylenol.  Please advise   Laverle Patter, RN

## 2019-09-02 NOTE — Telephone Encounter (Signed)
Please see if she can come see me tomorrow and Adderall on end the 2:00 slot.

## 2019-09-03 NOTE — Progress Notes (Signed)
[name], [name] ([id number]) Visit Report for 08/08/2019 Arrival Information Details Patient Name: Date of Service: [name] [name], [name] [name] 08/08/2019 3:15 PM Medical Record [id number] Patient Account Number: [id number] Date of Birth/Sex: Treating RN: [birth year] (63 y.o. Clearnce Sorrel Primary Care Sulayman Manning: [name] Other Clinician: Referring Friend Dorfman: Treating Samaira Holzworth/Extender:Robson, G[place] Folk in Treatment: 73 Visit Information History Since Last Visit Added or deleted any medications: No Patient Arrived: Other Any new allergies or adverse reactions: No Arrival Time: 15:57 Had a fall or experienced change in No Accompanied By: self activities of daily living that may affect Transfer Assistance: None risk of falls: Patient Identification Verified: Yes Signs or symptoms of abuse/neglect since last No Secondary Verification Process Yes visito Completed: Hospitalized since last visit: No Patient Requires Transmission- No Implantable device outside of the clinic excluding No Based Precautions: cellular tissue based products placed in the center Patient Has Alerts: Yes since last visit: Patient Alerts: R ABI = 1.12, L Has Dressing in Place as Prescribed: Yes ABI = 1.1 Pain Present Now: No Electronic Signature(s) Signed: 08/08/2019 5:13:27 PM By: Kela Millin Entered By: Kela Millin on 08/08/2019 15:57:57 -------------------------------------------------------------------------------- Clinic Level of Care Assessment Details Patient Name: Date of Service: [name] [name], [name] [name] 08/08/2019 3:15 PM Medical Record [id number] Patient Account Number: [id number] Date of Birth/Sex: Treating RN: [birth year] (63 y.o. [name] Primary Care Tobi Leinweber: Carollee Herter, Kendrick Fries Other Clinician: Referring Stepehn Eckard: Treating Xiao Graul/Extender:Robson, Grafton Folk in Treatment: 42 Clinic Level of Care Assessment  Items TOOL 4 Quantity Score X - Use when only an EandM is performed on FOLLOW-UP visit 1 0 ASSESSMENTS - Nursing Assessment / Reassessment X - Reassessment of Co-morbidities (includes updates in patient status) 1 10 X - Reassessment of Adherence to Treatment Plan 1 5 ASSESSMENTS - Wound and Skin Assessment / Reassessment _0  - Simple Wound Assessment / Reassessment - one wound 0 X - Complex Wound Assessment / Reassessment - multiple wounds 2 5 _1  - Dermatologic / Skin Assessment (not related to wound area) 0 ASSESSMENTS - Focused Assessment _2  - Circumferential Edema Measurements - multi extremities 0 _3  - Nutritional Assessment / Counseling / Intervention 0 X - Lower Extremity Assessment (monofilament, tuning fork, pulses) 1 5 _4  - Peripheral Arterial Disease Assessment (using hand held doppler) 0 ASSESSMENTS - Ostomy and/or Continence Assessment and Care _5  - Incontinence Assessment and Management 0 _6  - Ostomy Care Assessment and Management (repouching, etc.) 0 PROCESS - Coordination of Care X - Simple Patient / Family Education for ongoing care 1 15 _7  - Complex (extensive) Patient / Family Education for ongoing care 0 X - Staff obtains Programmer, systems, Records, Test Results / Process Orders 1 10 _8  - Staff telephones HHA, Nursing Homes / Clarify orders / etc 0 _9  - Routine Transfer to another Facility (non-emergent condition) 0 _10  - Routine Hospital Admission (non-emergent condition) 0 _11  - New Admissions / Biomedical engineer / Ordering NPWT, Apligraf, etc. 0 _12  - Emergency Hospital Admission (emergent condition) 0 X - Simple Discharge Coordination 1 10 _13  - Complex (extensive) Discharge Coordination 0 PROCESS - Special Needs _14  - Pediatric / Minor Patient Management 0 _15  - Isolation Patient Management 0 _16  - Hearing / Language / Visual special needs 0 _17  - Assessment of Community assistance (transportation, D/C planning, etc.) 0 _18  - Additional assistance / Altered mentation  0 _19  - Support Surface(s) Assessment (bed, cushion, seat, etc.) 0 INTERVENTIONS - Wound Cleansing / Measurement _20  - Simple Wound Cleansing - one wound 0  X - Complex Wound Cleansing - multiple wounds 2 5 X - Wound Imaging (photographs - any number of wounds) 1 5 _0  - Wound Tracing (instead of photographs) 0 _1  - Simple Wound Measurement - one wound 0 X - Complex Wound Measurement - multiple wounds 2 5 INTERVENTIONS - Wound Dressings _2  - Small Wound Dressing one or multiple wounds 0 X - Medium Wound Dressing one or multiple wounds 1 15 _3  - Large Wound Dressing one or multiple wounds 0 X - Application of Medications - topical 1 5 <[id number]>_3<\/[id number]>_2  - Application of Medications - injection 0 INTERVENTIONS - Miscellaneous _5  - External ear exam 0 _6  - Specimen Collection (cultures, biopsies, blood, body fluids, etc.) 0 _7  - Specimen(s) / Culture(s) sent or taken to Lab for analysis 0 _8  - Patient Transfer (multiple staff / Civil Service fast streamer / Similar devices) 0 _9  - Simple Staple / Suture removal (25 or less) 0 _10  - Complex Staple / Suture removal (26 or more) 0 _11  - Hypo / Hyperglycemic Management (close monitor of Blood Glucose) 0 _12  - Ankle / Brachial Index (ABI) - do not check if billed separately 0 X - Vital Signs 1 5 Has the patient been seen at the hospital within the last three years: Yes Total Score: 115 Level Of Care: New/Established - Level 3 Electronic Signature(s) Signed: 08/12/2019 5:52:09 PM By: [name] RN, BSN Entered By: [name] on 08/08/2019 17:13:15 -------------------------------------------------------------------------------- Encounter Discharge Information Details Patient Name: Date of Service: [name] [name] 08/08/2019 3:15 PM Medical Record [id number] Patient Account Number: [id number] Date of Birth/Sex: Treating RN: 06-Jul-1956 (63 y.o. Clearnce Sorrel Primary Care Lenorris Karger: [name] Other Clinician: Referring Sonika Levins: Treating  Saoirse Legere/Extender:Robson, Grafton Folk in Treatment: 37 Encounter Discharge Information Items Discharge Condition: Stable Ambulatory Status: Wheelchair Discharge Destination: Home Transportation: Private Auto Accompanied By: self Schedule Follow-up Appointment: Yes Clinical Summary of Care: Patient Declined Electronic Signature(s) Signed: 08/08/2019 5:13:27 PM By: Kela Millin Entered By: Kela Millin on 08/08/2019 16:53:45 -------------------------------------------------------------------------------- Lower Extremity Assessment Details Patient Name: Date of Service: MISHAL, PROBERT 08/08/2019 3:15 PM Medical Record [id number] Patient Account Number: [id number] Date of Birth/Sex: Treating RN: [birth year] (63 y.o. Clearnce Sorrel Primary Care Brant Peets: [name] Other Clinician: Referring Kevona Lupinacci: Treating Monasia Lair/Extender:Robson, Tennis Ship, Tora Kindred in Treatment: 38 Edema Assessment Assessed: [Left: No] [Right: No] Edema: [Left: N] [Right: o] Calf Left: Right: Point of Measurement: 28 cm From Medial Instep 37 cm cm Ankle Left: Right: Point of Measurement: 10 cm From Medial Instep 22.5 cm cm Electronic Signature(s) Signed: 08/08/2019 5:13:27 PM By: Kela Millin Entered By: Kela Millin on 08/08/2019 15:59:24 -------------------------------------------------------------------------------- Multi Wound Chart Details Patient Name: Date of Service: [name] [name] 08/08/2019 3:15 PM Medical Record [id number] Patient Account Number: [id number] Date of Birth/Sex: Treating RN: [birth year] (63 y.o. 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] [6:Lower Extremity] Comorbid History: [3:Cataracts, Anemia, Coronary Artery Disease, Coronary Artery Disease, Hypertension, Myocardial Hypertension, Myocardial Infarction, Type II Diabetes, Infarction, Type II Diabetes, Osteoarthritis, Osteomyelitis, Neuropathy  Osteomyelitis, Neuropathy] [6:Cataracts, Anemia, Osteoarthritis,] [N/A:N/A] Date Acquired: [3:11/12/2016] [6:06/19/2019] [N/A:N/A]  Weeks of Treatment: [3:38] [6:7] [N/A:N/A] Wound Status: [3:Open] [6:Open] [N/A:N/A] Measurements L x W x D 2.6x2x0.3 [6:0.3x0.2x0.1] [N/A:N/A] (cm) Area (cm) : [3:4.084] [6:0.047] [N/A:N/A] Volume (cm) : [3:1.225] [6:0.005] [N/A:N/A] % Reduction in Area: [3:23.70%] [6:-487.50%] [N/A:N/A] % Reduction in Volume: 80.90% [6:-400.00%] [N/A:N/A] Classification: [3:Grade 2] [6:Grade 2] [N/A:N/A] Exudate Amount: [3:Medium] [6:Small] [N/A:N/A] Exudate Type: [3:Serosanguineous] [6:Serosanguineous] [N/A:N/A] Exudate Color: [3:red, brown] [6:red, brown] [N/A:N/A] Wound Margin: [3:Flat and Intact] [6:Flat and Intact] [N/A:N/A] Granulation Amount: [3:Medium (34-66%)] [6:Large (67-100%)] [N/A:N/A] Granulation Quality: [3:Pink] [6:Pink] [N/A:N/A] Necrotic Amount: [3:Medium (34-66%)] [6:None Present (0%)] [N/A:N/A] Exposed Structures: [3:Fat Layer (Subcutaneous Fat Layer (Subcutaneous N/A Tissue) Exposed: Yes Fascia: No Tendon: No Muscle: No Joint: No Bone: No None] [6:Tissue) Exposed: Yes Fascia: No Tendon: No Muscle: No Joint: No Bone: No Small (1-33%)] [N/A:N/A] Treatment Notes Wound #3 (Left Calcaneus) 1. Cleanse With Wound Cleanser 2. Periwound Care Skin Prep 3. Primary Dressing  Applied Collegen AG 4. Secondary Dressing Dry Gauze Roll Gauze Notes conform for toe and felt. stretch net Wound #6 (Left Toe Great) 1. Cleanse With Wound Cleanser 2. Periwound Care Skin Prep 3. Primary Dressing Applied Collegen AG 4. Secondary Dressing Dry Gauze Roll Gauze Notes conform for toe and felt. stretch net Electronic Signature(s) Signed: 08/10/2019 10:27:00 AM By: Linton Ham MD Signed: 08/12/2019 5:52:09 PM By: [name] RN, BSN Entered By: Linton Ham on 08/10/2019 09:50:52 -------------------------------------------------------------------------------- Multi-Disciplinary Care Plan Details Patient Name: Date of Service: [name] [name], [name] [name] 08/08/2019 3:15 PM Medical Record [id number] Patient Account Number: [id number] Date of Birth/Sex: Treating RN: 02-25-1956 (63 y.o. [name] Primary Care Deionte Spivack: Carollee Herter, Kendrick Fries Other Clinician: Referring Danish Ruffins: Treating Caven Perine/Extender:Robson, Grafton Folk in Treatment: 16 Active Inactive Abuse / Safety / Falls / Self Care Management Nursing Diagnoses: Potential for injury related to falls Goals: Patient/caregiver will verbalize understanding of skin care regimen Date Initiated: 11/12/2018 Target Resolution Date: 08/29/2019 Goal Status: Active Interventions: Assess Activities of Daily Living upon admission and as needed Assess fall risk on admission and as needed Assess: immobility, friction, shearing, incontinence upon admission and as needed Assess impairment of mobility on admission and as needed per policy Assess personal safety and home safety (as indicated) on admission and as needed Assess self care needs on admission and as needed Notes: Wound/Skin Impairment Nursing Diagnoses: Knowledge deficit related to ulceration/compromised skin integrity Goals: Patient/caregiver will verbalize understanding of skin care regimen Date Initiated:  04/11/2019 Target Resolution Date: 08/29/2019 Goal Status: Active Ulcer/skin breakdown will have a volume reduction of 30% by week 4 Date Initiated: 11/12/2018 Date Inactivated: 01/17/2019 Target Resolution Date: 01/11/2019 Goal Status: Met Ulcer/skin breakdown will have a volume reduction of 50% by week 8 Date Initiated: 01/17/2019 Date Inactivated: 02/14/2019 Target Resolution Date: 02/14/2019 Goal Status: Met Ulcer/skin breakdown will have a volume reduction of 80% by week 12 Date Initiated: 02/14/2019 Date Inactivated: 03/21/2019 Target Resolution Date: 03/14/2019 Unmet Reason: infection in Goal Status: Unmet wound Interventions: Assess patient/caregiver ability to obtain necessary supplies Assess patient/caregiver ability to perform ulcer/skin care regimen upon admission and as needed Assess ulceration(s) every visit Notes: Electronic Signature(s) Signed: 08/12/2019 5:52:09 PM By: [name] RN, BSN Entered By: [name] on 08/08/2019 16:18:35 -------------------------------------------------------------------------------- Pain Assessment Details Patient Name: Date of Service: [name] [name] 08/08/2019 3:15 PM Medical Record [id number] Patient Account Number: [id number] Date of Birth/Sex: Treating RN: Mar 29, 1956 (63 y.o. Clearnce Sorrel Primary Care Marcee Jacobs: [name] Other Clinician: Referring Rennee Coyne: Treating Seichi Kaufhold/Extender:Robson, Lennie Odor CHASE, Tora Kindred in Treatment:  38 Active Problems Location of Pain Severity and Description of Pain Patient Has Paino No Site Locations Pain Management and Medication Current Pain Management: Electronic Signature(s) Signed: 08/08/2019 5:13:27 PM By: Kela Millin Entered By: Kela Millin on 08/08/2019 15:58:42 -------------------------------------------------------------------------------- Patient/Caregiver Education Details Patient Name: Date of Service: [name] [name]  10/9/2020andnbsp3:15 PM Medical Record Patient Account Number: [id number] Number: Treating RN: [name] Date of Birth/Gender: [birth year] (63 y.o. F) Other Clinician: Primary Care Physician: Carollee Herter, Antionette Poles Referring Physician: Physician/Extender: Ike Bene in Treatment: 13 Education Assessment Education Provided To: Patient Education Topics Provided Wound/Skin Impairment: Methods: Explain/Verbal Responses: State content correctly Electronic Signature(s) Signed: 08/12/2019 5:52:09 PM By: [name] RN, BSN Entered By: [name] on 08/08/2019 16:18:51 -------------------------------------------------------------------------------- Wound Assessment Details Patient Name: Date of Service: [name] [name], [name] [name] 08/08/2019 3:15 PM Medical Record [id number] Patient Account Number: [id number] Date of Birth/Sex: Treating RN: 04-06-1956 (63 y.o. Clearnce Sorrel Primary Care Briauna Gilmartin: Carollee Herter, Kendrick Fries Other Clinician: Referring Jamey Demchak: Treating Lennie Vasco/Extender:Robson, Tennis Ship, Tora Kindred in Treatment: 38 Wound Status Wound Number: 3 Primary Diabetic Wound/Ulcer of the Lower Extremity Etiology: Wound Location: Left Calcaneus Wound Open Wounding Event: Trauma Status: Date Acquired: 11/12/2016 Comorbid Cataracts, Anemia, Coronary Artery Disease, Weeks Of Treatment: 38 History: Hypertension, Myocardial Infarction, Type II Clustered Wound: No Diabetes, Osteoarthritis, Osteomyelitis, Neuropathy Photos Wound Measurements Length: (cm) 2.6 Width: (cm) 2 Depth: (cm) 0.3 Area: (cm) 4.084 Volume: (cm) 1.225 Wound Description Classification: Grade 2 Wound Margin: Flat and Intact Exudate Amount: Medium Exudate Type: Serosanguineous Exudate Color: red, brown Wound Bed Granulation Amount: Medium (34-66%) Granulation Quality: Pink Necrotic Amount: Medium (34-66%) Necrotic Quality:  Adherent Slough fter Cleansing: No ino Yes Exposed Structure sed: No (Subcutaneous Tissue) Exposed: Yes osed: No osed: No sed: No ed: No % Reduction in Area: 23.7% % Reduction in Volume: 80.9% Epithelialization: None Tunneling: No Undermining: No Foul Odor A Slough/Fibr Fascia Expo Fat Layer Tendon Exp Muscle Exp Joint Expo Bone Expos Electronic Signature(s) Signed: 08/13/2019 5:37:20 PM By: Kela Millin Signed: 09/03/2019 4:02:01 PM By: Mikeal Hawthorne EMT/HBOT Previous Signature: 08/08/2019 5:13:27 PM Version By: Kela Millin Entered By: Mikeal Hawthorne on 08/12/2019 10:38:51 -------------------------------------------------------------------------------- Wound Assessment Details Patient Name: Date of Service: [name] [name], [name] [name] 08/08/2019 3:15 PM Medical Record [id number] Patient Account Number: [id number] Date of Birth/Sex: Treating RN: 1956/07/04 (63 y.o. Clearnce Sorrel Primary Care Toni Demo: Carollee Herter, Kendrick Fries Other Clinician: Referring [name]: Treating Raylene Carmickle/Extender:Robson, Tennis Ship, Tora Kindred in Treatment: 38 Wound Status Wound Number: 6 Primary Diabetic Wound/Ulcer of the Lower Extremity Etiology: Wound Location: Left Toe Great Wound Open Wounding Event: Blister Status: Date Acquired: 06/19/2019 Comorbid Cataracts, Anemia, Coronary Artery Disease, Weeks Of Treatment: 7 History: Hypertension, Myocardial Infarction, Type II Clustered Wound: No Diabetes, Osteoarthritis, Osteomyelitis, Neuropathy Photos Wound Measurements Length: (cm) 0.3 Width: (cm) 0.2 Depth: (cm) 0.1 Area: (cm) 0.047 Volume: (cm) 0.005 Wound Description Classification: Grade 2 Wound Margin: Flat and Intact Exudate Amount: Small Exudate Type: Serosanguineous Exudate Color: red, brown Wound Bed Granulation Amount: Large (67-100%) Granulation Quality: Pink Necrotic Amount: None Present (0%) After Cleansing: No brino No Exposed  Structure osed: No (Subcutaneous Tissue) Exposed: Yes osed: No osed: No sed: No ed: No % Reduction in Area: -487.5% % Reduction in Volume: -400% Epithelialization: Small (1-33%) Tunneling: No Undermining: No Foul Odor Slough/Fi Fascia Exp Fat Layer Tendon Exp Muscle Exp Joint Expo Bone Expos Electronic Signature(s) Signed: 08/13/2019 5:37:20 PM By: Kela Millin Signed: 09/03/2019 4:02:01 PM By: Mikeal Hawthorne EMT/HBOT  Previous Signature: 08/08/2019 5:13:27 PM Version By: Kela Millin Entered By: Mikeal Hawthorne on 08/12/2019 10:38:29 -------------------------------------------------------------------------------- Vitals Details Patient Name: Date of Service: [name] [name], [name] [name] 08/08/2019 3:15 PM Medical Record [id number] Patient Account Number: [id number] Date of Birth/Sex: Treating RN: [birth year] (63 y.o. Clearnce Sorrel Primary Care Kolleen Ochsner: Carollee Herter, Kendrick Fries Other Clinician: Referring Ruthie Berch: Treating Shyan Scalisi/Extender:Robson, Tennis Ship, Tora Kindred in Treatment: 39 Vital Signs Time Taken: 15:58 Temperature (F): 98.4 Height (in): 62 Pulse (bpm): 80 Weight (lbs): 180 Respiratory Rate (breaths/min): 18 Body Mass Index (BMI): 32.9 Blood Pressure (mmHg): 158/89 Reference Range: 80 - 120 mg / dl Electronic Signature(s) Signed: 08/08/2019 5:13:27 PM By: Kela Millin Entered By: Kela Millin on 08/08/2019 15:58:35

## 2019-09-04 ENCOUNTER — Encounter (HOSPITAL_COMMUNITY)
Admission: RE | Admit: 2019-09-04 | Discharge: 2019-09-04 | Disposition: A | Discharge status: Home / Self Care | Payer: PRIVATE HEALTH INSURANCE | Source: Ambulatory Visit | Attending: Nephrology | Admitting: Nephrology

## 2019-09-04 ENCOUNTER — Other Ambulatory Visit: Payer: Self-pay

## 2019-09-04 ENCOUNTER — Ambulatory Visit (INDEPENDENT_AMBULATORY_CARE_PROVIDER_SITE_OTHER): Payer: PRIVATE HEALTH INSURANCE | Admitting: Internal Medicine

## 2019-09-04 DIAGNOSIS — M00021 Staphylococcal arthritis, right elbow: Secondary | ICD-10-CM

## 2019-09-04 DIAGNOSIS — N189 Chronic kidney disease, unspecified: Secondary | ICD-10-CM | POA: Insufficient documentation

## 2019-09-04 DIAGNOSIS — D631 Anemia in chronic kidney disease: Secondary | ICD-10-CM | POA: Insufficient documentation

## 2019-09-04 DIAGNOSIS — R197 Diarrhea, unspecified: Secondary | ICD-10-CM

## 2019-09-04 HISTORY — DX: Diarrhea, unspecified: R19.7

## 2019-09-04 MED ORDER — VANCOMYCIN HCL 125 MG PO CAPS: 125.0000 mg | ORAL_CAPSULE | Freq: Four times a day (QID) | ORAL | 1 refills | Status: DC

## 2019-09-04 MED ORDER — HEPARIN SOD (PORK) LOCK FLUSH 100 UNIT/ML IV SOLN
250.0000 [IU] | Freq: Every day | INTRAVENOUS | Status: DC
  Administered 2019-09-04: 12:00:00 250 [IU]

## 2019-09-04 MED ORDER — SODIUM CHLORIDE 0.9 % IV SOLN
510.0000 mg | INTRAVENOUS | Status: DC
  Administered 2019-09-04: 510 mg via INTRAVENOUS
  Filled 2019-09-04: qty 17

## 2019-09-04 MED ORDER — HEPARIN SOD (PORK) LOCK FLUSH 100 UNIT/ML IV SOLN: 250.0000 [IU] | INTRAVENOUS | Status: DC | PRN

## 2019-09-04 MED ORDER — HEPARIN SOD (PORK) LOCK FLUSH 100 UNIT/ML IV SOLN
INTRAVENOUS | Status: AC
  Administered 2019-09-04: 250 [IU]
  Filled 2019-09-04: qty 5

## 2019-09-04 NOTE — Assessment & Plan Note (Signed)
I am worried about the possibility of C. 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  I am less concerned that it is due to daptomycin.

## 2019-09-04 NOTE — Progress Notes (Signed)
[facility]  Patient Active Problem List   Diagnosis Date Noted  . Septic arthritis of elbow, right ([place]) 04/17/2019    Priority: High  . Ulcer of left heel, limited to breakdown of skin ([place]) 02/01/2017    Priority: Medium  . Diarrhea 09/04/2019  . Pressure injury of skin 04/17/2019  . 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 Acute on chronic renal insufficiency 04/15/2019  . Sleep apnea 11/16/2017  . Diabetic foot infection ([place]) 04/06/2017  . Type 2 diabetes mellitus with hyperglycemia, with long-term current use of insulin ([place]) 04/06/2017  . Neuropathy 05/05/2015  . Diabetic peripheral neuropathy ([place]) 01/12/2015  . Infection associated with orthopedic device ([place]) 12/11/2014  . CKD (chronic kidney disease), stage III ([place]) 05/27/2014  . History of MI (myocardial infarction) 10/06/2013  . Obesity (BMI 30-39.9) 09/20/2013  . 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 Hyponatremia 02/03/2013  . Normocytic anemia 02/03/2013  . CAD (coronary artery disease) 01/11/2012  . Hepatic cirrhosis ([place]) 07/06/2010  . 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  . Hyperlipidemia 06/03/2008  . Essential hypertension 12/23/2006    Patient's Medications  New Prescriptions   VANCOMYCIN (VANCOCIN) 125 MG CAPSULE    Take 1 capsule (125 mg total) by mouth 4 (four) times daily.  Previous Medications   AMINO ACIDS-PROTEIN HYDROLYS (FEEDING SUPPLEMENT, PRO-STAT SUGAR FREE 64,) LIQD    Take 30 mLs by mouth 2 (two) times daily.   ASPIRIN EC 81 MG EC TABLET    Take 1 tablet (81 mg total) by mouth 2 (two) times daily.   ATORVASTATIN (LIPITOR) 10 MG TABLET    Take 1 tablet by mouth once daily   CALCIUM ALGINATE EX    Apply 1 patch topically every other day.   CYCLOBENZAPRINE (FLEXERIL) 10 MG TABLET    Take 1 tablet (10 mg total) by mouth 3 (three) times daily as needed for up to 15 doses for muscle spasms.   DAPTOMYCIN 661 MG IN SODIUM CHLORIDE 0.9 % 100  ML    Inject 661 mg into the vein daily at 8 pm.   DOCUSATE SODIUM (COLACE) 100 MG CAPSULE    Take 100 mg by mouth daily.   DULOXETINE (CYMBALTA) 60 MG CAPSULE    Take 1 capsule by mouth once daily   ENSURE MAX PROTEIN (ENSURE MAX PROTEIN) LIQD    Take 330 mLs (11 oz total) by mouth 2 (two) times daily.   EPINEPHRINE 0.3 MG/0.3 ML IJ SOAJ INJECTION    Inject into the muscle.   FERROUS SULFATE (IRON) 325 (65 FE) MG TABS    Take 1 tablet by mouth daily.    FLUTICASONE (FLONASE) 50 MCG/ACT NASAL SPRAY    Place 2 sprays into both nostrils daily.   GUAIFENESIN (MUCINEX) 600 MG 12 HR TABLET    Take 1 tablet (600 mg total) by mouth 2 (two) times daily.   HYDROCHLOROTHIAZIDE (HYDRODIURIL) 25 MG TABLET    Take 1 tablet (25 mg total) by mouth daily. RESUME AFTER EVALUATED BY PCP   HYDROCODONE-ACETAMINOPHEN (NORCO/VICODIN) 5-325 MG TABLET    Take 1 tablet by mouth every 6 (six) hours as needed for moderate pain.   INSULIN GLARGINE (LANTUS SOLOSTAR) 100 UNIT/ML SOLOSTAR PEN    INJECT 20 UNITS SUBCUTANEOUSLY ONCE DAILY IN THE MORNING AND THEN INJECT 40 UNITS AT BEDTIME   INSULIN LISPRO (HUMALOG KWIKPEN) 200 UNIT/ML SOPN    Inject 13 Units into the skin daily.  LEVOFLOXACIN (LEVAQUIN) 250 MG TABLET    Take 1 tablet (250 mg total) by mouth daily.   METRONIDAZOLE (FLAGYL) 500 MG TABLET    Take 1 tablet (500 mg total) by mouth 3 (three) times daily.   MULTIPLE VITAMINS-MINERALS (MULTIVITAMIN GUMMIES WOMENS) CHEW    Chew 2 each by mouth daily.   NUTRITIONAL SUPPLEMENTS (FEEDING SUPPLEMENT, NEPRO CARB STEADY,) LIQD    Take 237 mLs by mouth 3 (three) times daily as needed (Supplement).   NYSTATIN (MYCOSTATIN/NYSTOP) POWDER    Apply topically 3 (three) times daily.   ONDANSETRON (ZOFRAN ODT) 4 MG DISINTEGRATING TABLET    Take 1 tablet (4 mg total) by mouth every 8 (eight) hours as needed for nausea or vomiting.   POLYETHYLENE GLYCOL (MIRALAX / GLYCOLAX) PACKET    Take 17 g by mouth daily as needed for mild  constipation.    PREGABALIN (LYRICA) 150 MG CAPSULE    Take 1 capsule (150 mg total) by mouth 2 (two) times daily.  Modified Medications   No medications on file  Discontinued Medications   No medications on file    Subjective: Nicholette is seen on a working basis.  She had a new central line placed on 08/28/2019 and started daily daptomycin.  Shortly afterwards she began to have daily bouts of watery diarrhea.  She is also had some mild headache.  She has not had any fever, nausea or vomiting.  She has noted improvement in her right elbow swelling and drainage and her husband told her that her left heel wound seems to be getting smaller.  So far Dr. [name] has not been able to locate a orthopedic surgeon at one of the local academic medical centers to take over her care.  Review of Systems: Review of Systems  Constitutional: Negative for fever.  Gastrointestinal: Positive for diarrhea. Negative for abdominal pain, nausea and vomiting.  Musculoskeletal: Positive for joint pain.    Past Medical History:  Diagnosis Date  . Acute MI [facility]) 2007   presented to ED & had cardiac cath- but found to have normal coronaries. Since that point in time her PCP cares f or cardiac needs. Dr. [name] - [facility]  . Anemia   . Anginal pain ([place])   . Anxiety   . Asthma   . Bulging lumbar disc   . Cataract   . 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 Cirrhosis of liver without mention of alcohol   . Constipation   . Dehiscence of closure of skin    left partial calcaneal excision  . Depression   . Diabetes mellitus    insulin dependent, adult onset  . Episode of visual loss of left eye   . Exertional shortness of breath   . Fatty liver   . Fibromyalgia   . GERD (gastroesophageal reflux disease)   . Hepatic steatosis   . High cholesterol   . Hypertension   . MRSA (methicillin resistant Staphylococcus aureus)   . Neuropathy    lower  legs  . Osteoarthritis    hands, hips  . Proximal humerus fracture 10/15/12   Left  . PTSD (post-traumatic stress disorder)   . Renal insufficiency 05/05/2015    Social History   Tobacco Use  . Smoking status: Never Smoker  . Smokeless tobacco: Never Used  Substance Use Topics  . Alcohol use: No    Alcohol/week: 0.0 standard drinks  . Drug use: No  Family History  Problem Relation Age of Onset  . Heart disease Father   . Diabetes Father   . Colitis Father   . Crohn's disease Father   . Cancer Father        leukemia  . Leukemia Father   . Diabetes Mellitus II Brother   . Kidney disease Brother   . Heart disease Brother   . Diabetes Mother   . Hypertension Mother   . Mental illness Mother   . Irritable bowel syndrome Daughter   . Diabetes Mellitus II Brother   . Kidney disease Brother   . Liver disease Brother   . Kidney disease Brother   . Heart attack Brother   . Diabetes Mellitus II Brother   . Heart disease Brother   . Liver disease Brother   . Kidney disease Brother   . Kidney disease Brother   . Diabetes Mellitus II Brother   . Diabetes Mellitus I Brother     Allergies  Allergen Reactions  . Bee Pollen Anaphylaxis  . Fish-Derived Products Hives, Shortness Of Breath, Swelling and Rash    Hives get in throat causing trouble breathing  . Mushroom Extract Complex Anaphylaxis  . Penicillins Anaphylaxis    Did it involve swelling of the face/tongue/throat, SOB, or low BP? Yes Did it involve sudden or severe rash/hives, skin peeling, or any reaction on the inside of your mouth or nose? No Did you need to seek medical attention at a hospital or doctor's office? Yes When did it last happen?A few months ago If all above answers are "NO", may proceed with cephalosporin use.  [name] Kitchen Rosemary Oil Anaphylaxis  . Shellfish Allergy Hives, Shortness Of Breath, Swelling and Rash  . Tomato Hives and Shortness Of Breath    Hives in throat causes her trouble breathing  .  Acetaminophen Other (See Comments)    GI upset  . Acyclovir And Related Other (See Comments)    Unknown rxn  . Aloe Vera Hives  . Broccoli [Brassica Oleracea] Hives  . Naproxen Other (See Comments)    Unknown rxn    Objective: There were no vitals filed for this visit. There is no height or weight on file to calculate BMI.  Physical Exam Constitutional:      Comments: She is seated in her wheelchair.  Chest:    Abdominal:     Palpations: Abdomen is soft.     Tenderness: There is no abdominal tenderness.  Musculoskeletal:     Comments: She has a clean dry dressing on her right elbow.  Psychiatric:        Mood and Affect: Mood normal.     Lab Results Sedimentation rate 09/01/2019: 48 C-reactive protein 09/01/2019: 1 CK 09/01/2019: Normal at 132 Creatinine 09/01/2019: Stable 1.87 Problem List Items Addressed This Visit      High   Septic arthritis of elbow, right ([place])    She seems to be improving on IV daptomycin.  She will follow-up here on 09/15/2019.      Relevant Medications   vancomycin (VANCOCIN) 125 MG capsule     Unprioritized   Diarrhea    I am worried about the possibility of C. 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  I am less concerned that it is due to daptomycin.      Relevant Medications   vancomycin (VANCOCIN) 125 MG capsule   Other Relevant Orders   Clostridium Difficile by PCR       Jenny Reichmann  Megan Salon, [place] [facility] [place] [phone] pager   [phone] cell 09/04/2019, 2:26 PM

## 2019-09-04 NOTE — Assessment & Plan Note (Signed)
She seems to be improving on IV daptomycin.  She will follow-up here on 09/15/2019.

## 2019-09-05 ENCOUNTER — Encounter (HOSPITAL_BASED_OUTPATIENT_CLINIC_OR_DEPARTMENT_OTHER): Payer: PRIVATE HEALTH INSURANCE | Attending: Internal Medicine | Admitting: Internal Medicine

## 2019-09-05 ENCOUNTER — Ambulatory Visit: Payer: PRIVATE HEALTH INSURANCE | Admitting: Family Medicine

## 2019-09-05 DIAGNOSIS — I10 Essential (primary) hypertension: Secondary | ICD-10-CM | POA: Insufficient documentation

## 2019-09-05 DIAGNOSIS — E11621 Type 2 diabetes mellitus with foot ulcer: Secondary | ICD-10-CM | POA: Diagnosis not present

## 2019-09-05 DIAGNOSIS — L97423 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: Secondary | ICD-10-CM | POA: Insufficient documentation

## 2019-09-05 DIAGNOSIS — Z8614 Personal history of Methicillin resistant Staphylococcus aureus infection: Secondary | ICD-10-CM | POA: Insufficient documentation

## 2019-09-05 DIAGNOSIS — E1140 Type 2 diabetes mellitus with diabetic neuropathy, unspecified: Secondary | ICD-10-CM | POA: Diagnosis not present

## 2019-09-05 DIAGNOSIS — L97522 Non-pressure chronic ulcer of other part of left foot with fat layer exposed: Secondary | ICD-10-CM | POA: Insufficient documentation

## 2019-09-08 ENCOUNTER — Other Ambulatory Visit (HOSPITAL_COMMUNITY)
Admission: RE | Admit: 2019-09-08 | Discharge: 2019-09-08 | Disposition: A | Discharge status: Home / Self Care | Payer: PRIVATE HEALTH INSURANCE | Source: Other Acute Inpatient Hospital | Attending: Internal Medicine | Admitting: Internal Medicine

## 2019-09-08 ENCOUNTER — Telehealth: Payer: Self-pay

## 2019-09-08 DIAGNOSIS — T847XXA Infection and inflammatory reaction due to other internal orthopedic prosthetic devices, implants and grafts, initial encounter: Secondary | ICD-10-CM | POA: Diagnosis present

## 2019-09-08 DIAGNOSIS — M86171 Other acute osteomyelitis, right ankle and foot: Secondary | ICD-10-CM | POA: Insufficient documentation

## 2019-09-08 DIAGNOSIS — N390 Urinary tract infection, site not specified: Secondary | ICD-10-CM | POA: Diagnosis present

## 2019-09-08 DIAGNOSIS — E11621 Type 2 diabetes mellitus with foot ulcer: Secondary | ICD-10-CM | POA: Diagnosis present

## 2019-09-08 LAB — BASIC METABOLIC PANEL
Anion gap: 6 (ref 5–15)
BUN: 14 mg/dL (ref 8–23)
CO2: 26 mmol/L (ref 22–32)
Calcium: 8.7 mg/dL — ABNORMAL LOW (ref 8.9–10.3)
Chloride: 103 mmol/L (ref 98–111)
Creatinine, Ser: 1.9 mg/dL — ABNORMAL HIGH (ref 0.44–1.00)
GFR calc Af Amer: 32 mL/min — ABNORMAL LOW (ref >60)
GFR calc non Af Amer: 28 mL/min — ABNORMAL LOW (ref >60)
Glucose, Bld: 481 mg/dL — ABNORMAL HIGH (ref 70–99)
Potassium: 4.6 mmol/L (ref 3.5–5.1)
Sodium: 135 mmol/L (ref 135–145)

## 2019-09-08 LAB — CBC
HCT: 32.9 % — ABNORMAL LOW (ref 36.0–46.0)
Hemoglobin: 10 g/dL — ABNORMAL LOW (ref 12.0–15.0)
MCH: 26.3 pg (ref 26.0–34.0)
MCHC: 30.4 g/dL (ref 30.0–36.0)
MCV: 86.6 fL (ref 80.0–100.0)
Platelets: 107 10*3/uL — ABNORMAL LOW (ref 150–400)
RBC: 3.8 MIL/uL — ABNORMAL LOW (ref 3.87–5.11)
RDW: 15.8 % — ABNORMAL HIGH (ref 11.5–15.5)
WBC: 4.6 10*3/uL (ref 4.0–10.5)
nRBC: 0 % (ref 0.0–0.2)

## 2019-09-08 LAB — SEDIMENTATION RATE: Sed Rate: 45 mm/hr — ABNORMAL HIGH (ref 0–22)

## 2019-09-08 LAB — C-REACTIVE PROTEIN: CRP: 1.2 mg/dL — ABNORMAL HIGH (ref <1.0)

## 2019-09-08 LAB — CK: Total CK: 219 U/L (ref 38–234)

## 2019-09-08 NOTE — Telephone Encounter (Signed)
Patient called office today stating pharmacy never received her prescription for Vancomycin. States she has called them three times today, and that still don't have a prescription. Patient is still experiencing diarrhea.  Will follow up with [name] [name] in [facility] [place]

## 2019-09-08 NOTE — Telephone Encounter (Signed)
[facility] to follow up vancomycin prescription. Pharmacist states that they did receive prescription, but they have to have medication shipped to them. Pharmacy will call patient once it is ready to pick up. [place]

## 2019-09-09 NOTE — Progress Notes (Signed)
ASMA, BOLDON ([id number]) Visit Report for 09/05/2019 Debridement Details Patient Name: Date of Service: [name] [name], BOULEY 09/05/2019 3:30 PM Medical Record [id number] Patient Account Number: [id number] Date of Birth/Sex: Treating RN: 04/17/1956 (63 y.o. [name] Primary Care Provider: Carollee Herter, Kendrick Fries Other Clinician: Referring Provider: Carollee Herter, Kendrick Fries Treating Provider/Extender:Lusero Nordlund, Esperanza Richters in Treatment: 42 Debridement Performed for Wound #6 Left Toe Great Assessment: Performed By: Physician Ricard Dillon., MD Debridement Type: Debridement Severity of Tissue Pre Fat layer exposed Debridement: Level of Consciousness (Pre- Awake and Alert procedure): Pre-procedure Verification/Time Out Taken: Yes - 16:42 Start Time: 16:42 Total Area Debrided (L x W): 0.5 (cm) x 0.5 (cm) = 0.25 (cm) Tissue and other material Viable, Non-Viable, Callus, Skin: Epidermis debrided: Level: Skin/Epidermis Debridement Description: Selective/Open Wound Instrument: Curette Bleeding: None Response to Treatment: Procedure was tolerated well Level of Consciousness Awake and Alert (Post-procedure): Post Debridement Measurements of Total Wound Length: (cm) 0.2 Width: (cm) 0.2 Depth: (cm) 0.2 Volume: (cm) 0.006 Character of Wound/Ulcer Post Improved Debridement: Severity of Tissue Post Debridement: Fat layer exposed Post Procedure Diagnosis Same as Pre-procedure Electronic Signature(s) Signed: 09/07/2019 8:54:13 AM By: Linton Ham MD Signed: 09/09/2019 2:49:59 PM By: Baruch Gouty RN, BSN Previous Signature: 09/05/2019 6:04:56 PM Version By: Kela Millin Entered By: Linton Ham on 09/07/2019 07:00:47 -------------------------------------------------------------------------------- HPI Details Patient Name: Date of Service: [name] [name] 09/05/2019 3:30 PM Medical Record [id number] Patient Account Number: [id number] Date of  Birth/Sex: Treating RN: 1956/06/16 (63 y.o. [name] Primary Care Provider: [name] Other Clinician: Referring Provider: Treating Provider/Extender:Cadence Minton, Enedina Finner Weeks in Treatment: 32 History of Present Illness HPI Description: ADMISSION 11/12/2018 This is a 63 year old woman with type 2 diabetes and diabetic neuropathy. She has been dealing with a left heel plantar wound for roughly 2 years. She states this started when she pulled some skin off the area and it progressed into a wound. She also has an area on the tip of her left great toe for 1 year. She has been largely followed by Dr. Sharol Given and she had an excision of bone in the left heel in 2017. I do not see microbiology from this excision or pathology. Apparently this wound never really closed. She most recently has been using Silvadene cream. Offloading this with a scooter. The last MRI I see was in June 2018 which did not show osteomyelitis at that time. Apparently this wound is never really progressed towards healing. During her last review by Dr. Sharol Given in October it was recommended that she undergo a left BKA and she refused. She went to see a second orthopedic consult at [facility] who recommended conservative wound care to see if this will close or progress towards closure but also warned that possible surgery may be necessary. An x-ray that was done at [facility] showed an irregular calcaneal body and calcaneal tuberosity. This is probably postprocedural. Previous x-rays at [facility] had suggested heterotrophic calcifications but I do not see this. Apparently a second ointment was added to the Silvadene which the patient thinks is because some improvement. She has not been systemically unwell. No fever or chills. She thinks the second ointment that was given to her at [facility] has helped somewhat. 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 ABIs done in June 2018 showed a ABI of 1.12 on the right and 1.1 on the left she was biphasic bilaterally. ABIs in our clinic were noncompressible today. 11/20/18 on evaluation today patient is seen for her second visit here in the office although this is actually the first visit with me concerning an issue that she's having with her great toe and heel of the left foot. Fortunately she does not appear to be have any discomfort at this time and she does have a scooter in order to offload her foot she also has a boot for offloading. With that being said this is something that appears to have been going on for some time she was seeing Dr. due to his recommendation was that she was going to require a below knee amputation. With that being said she wanted to come to the wound center but according to the patient Dr. Sharol Given told her that "we could not help her". Nonetheless she saw another provider who suggested that it may be worth a shot for [name] to try and help her out if it all possible. Nonetheless she decided that it would be worth a trial since otherwise any the way she's gonna end up with an amputation. Obviously if we can heal the wound then that will not be the case. Again I explained to her that obviously there are no guarantees but we will give this a good try and attempts to get the wound to heal. 1/30; the patient continues to have areas on the left plantar heel and the left plantar great toe. The more worrisome area is the heel. She went for MRI on Saturday but this could not be done because she had silver alginate in the wound bed. I would like to get this rebooked. If she does not have osteomyelitis she will need a total contact cast. We have been  using silver alginate in the wounds 2/7; patient has wounds on her left plantar heel and left plantar great toe. The area on the heel has some depth although it looks about the same today. Her MRI will finally be done tomorrow. If she has osteomyelitis in the heel and then we will need to consider her for IV antibiotics and hyperbaric oxygen. If the MRI is negative she will need a total contact cast although she is wearing a cam boot and using a scooter at present. Will be using silver alginate. She will take it off tomorrow in preparation for the MRI 2/14; the patient's MRI showed extensive surgical changes with a large portion of her calcaneus removed on the left secondary to her previous surgery by Dr. Sharol Given however there is no evidence of osteomyelitis. She would therefore be a candidate for a total contact cast and we applied this for the first time today 2/21; silver collagen total contact cast. The area on the plantar left great toe is "healed" still a lot of callus on this area. Dimensions on the plantar heel not too much different some epithelialization is present however. This is an improvement 12/25/18 on evaluation today patient actually is seen for follow-up concerning issues that she has been having with her cast she states she feels like it's wet and squishy in the bottom of her cast. The reason she does come in to have this evaluated and see what is going on. Fortunately there's no signs of infection at this time was the cast was removed.  3/6; the patient's area on the tip of her right great toe is callused but there is no open area here. She still has the fairly extensive area in the left heel. We have been using silver collagen in the wound. 01/08/19 on evaluation today patient actually appears to be doing very well in regard to her heel ulcer in my pinion to see if you shown signs of improvement which is excellent news. There's no evidence of infection. She continues to have quite  a bit of drainage but fortunately again this doesn't seem to be hindering her healing. 3/20 -Patient's foot ulcer on the left appears to be doing well, the dimensions appear encouraging, we have been using total contact cast with Prisma and will continue doing that 3/27; patient continues to make nice progress on the left plantar heel. She is using silver collagen under a total contact cast. It is been a while since I have seen this wound and it really looks a lot better. Smaller with healthy granulation 4/3; she continues to make nice progress on the left plantar heel. Using silver collagen under a total contact cast 4/10; left plantar heel. Again skin over the surface of the wound with not much in the way of adherence. This results in undermining. Using silver collagen changed to silver alginate 4/17 left plantar heel. Again not much improvement. There is no undermining today no debridement was required we used silver alginate last time because of excessive moisture 4/24 left plantar heel. Again not as much improvement as I would have liked. About 3 mm of depth. Thick callused skin around the circumference. Using silver alginate 5/1; left plantar heel this is improved this week. Less depth epithelialization is present. Using silver alginate alginate under a total contact cast 5/8; left plantar heel dimensions are about the same. The depth appears to be improved. I use silver collagen starting today under the cast 5/15 left plantar heel. Arrives today with a 2-day history of feeling like something had "slipped" while walking her dog in the cast. Unfortunately extensive area relatively to the small wound of undermining superiorly denuded epithelium. 5/22-Patient returns at 1 week after being taken off the [facility] on account of some fluid collection that was debrided and cultured at last visit from the plantar ulcer on the left heel. The culture results are polymicrobial with  Klebsiella, enterococcus, Proteus growth these organisms are sensitive to Cipro except with enterococcus which is sensitive to ampicillin but patient is highly allergic to penicillin according to her. 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 5/29; the patient arrived last week with a new wound on her left plantar great toe. With regards to the culture that I did 2 weeks ago of her deteriorating heel wound this grew Klebsiella and enterococcus. She was given Cipro however the enterococcus would not be covered well by a quinolone. She is allergic to penicillin. I will give her linezolid 600 twice daily x5 days 6/12; the patient continues to have a wound on her left and after she returns to the beach there is undermining laterally. She has the new wound from this 2 weeks or so ago on the plantar tip of her left great toe. She had a fall today scraping the dorsal surface of the left fifth 7/14; READMISSION since the patient was last here she was hospitalized from 04/15/2019 through 04/22/2019. She had presented to an outside ER after  falling and hitting her elbow. She had had previous surgery on the elbow and fractures several years ago. An x-ray was negative and she was sent home. She is readmitted with sepsis and acute renal failure secondary to septic arthritis of the elbow. Cultures apparently showed pansensitive staph aureus however she has a severe beta-lactam allergy. She was treated with vancomycin. Apparently the vancomycin is completed and her PICC line is removed although she is seeing Dr. Megan Salon tomorrow due to continued pain and swelling. In the hospital she had an IandD by Dr. [name] of orthopedics. The original surgery was on 04/17/2019 and it was felt that she had septic arthritis. She is still having a lot of pain and swelling in the right elbow and apparently is due to see Dr. Megan Salon tomorrow  and what she thinks is that she will be restarted on antibiotics. With regards to her heel wounds/left foot wounds. Her arterial studies were checked and her ABIs were within normal limits. X-ray showed plantar foot ulcer negative for osteomyelitis postop resection of the posterior calcaneus. She has been using silver alginate to the heel 8/4-Patient returns after being seen on 7/14, we are using silver alginate to the calcaneal wound, she is continuing to receive care for her right elbow septic arthritis 8/14- Patient returns after 1 week, she is being seen by the surgical group for her right elbow, she is here for the left calcaneal wound for which we are using silver alginate this is about the same 8/21; this is a patient I readmitted to the clinic 5 weeks ago. She is continuing to have difficulties with a septic arthritis of the right elbow she had after a fall and apparently has had surgical IandD's since the last time we saw her. She is also following with Dr. Megan Salon of infectious disease and is apparently on 3 oral antibiotics although at the time of this dictation I am not sure what they are. She comes in today with a necrotic surface on the left great toe with a blister laterally. This was still clearly an open wound. She had purulent drainage coming out of this. The original wound on the plantar aspect of her right heel in the setting of a Charcot foot is deep not open to bone but certainly not any better at all. We have been using silver alginate 8/28; dealing with septic arthritis of the right elbow. May need to go for further surgery here in the second week of September. She had a blister on the left great toe that was purulent [place] draining last week although a culture did not grow anything [already on antibiotics through infectious disease for her elbow]. The punched-out area on her heel is just like it was when she first came in. This almost closed with a total contact cast there  are no options for that now. The patient states she cannot stay off her foot having to do housework X-ray of the foot showed a moderate bunion and severe degenerative changes at the first metatarsal phalangeal joint there was a moderate plantar calcaneal spur. We will need to check the location of this. 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 It is oral. We are using silver collagen to the punched-out area on her left heel and to the summer superficial area of the left great toe. She  is offloading this is best she can although judging by the amount of callus it is not enough. She thinks she has enough strength in her right elbow now to use her scooter. 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 Hale Bogus notes and infectious disease. We are using silver collagen to the punched-out areas on her left heel and to the plantar aspect of her left great toe she is now using her scooter to get around and to help offload these areas 10/2; 2-week follow-up. Still on clindamycin I believe for the left elbow she is going for an MRI of the elbow over the weekend. She is then going to see orthopedics and infectious disease. The area on the left heel is certainly no better. This does not probe to bone however there is green drainage. She has an area on the tip of her toe. The area on the heel is in a wound we almost closed at one point with total contact casting. 10/9; one-week follow-up. Culture I did of the heel last week which was a swab culture admittedly showed moderate Enterococcus faecalis moderate Pseudomonas. The wound itself looks about the same. There is no palpable bone although  the depth of it closely approximates bone. The heel is swollen but not erythematous. Her MRI is booked for next week. She also has an MRI of the right elbow. I've also lifted the last consult from Dr. Megan Salon who is following her for septic arthritis of the right elbow. I did not see him specifically comment on her left heel however the patient states that he is aware of this. I did not specifically address the organisms I cultured last week because of the possible effect of any additional cultures that will be done on the elbow. Additionally these were superficial not bone cultures 10/16; her MRI of the heel was put back to next week. She did have her elbow done. I have been in contact with Dr. Megan Salon about my concerns about osteomyelitis of the left heel. We have been using collagen. She also has a wound on the plantar tip of her first toe 10/23; her MRI of the heel was negative for osteomyelitis but suggested a small abscess. She also had an MRI of her elbow that showed findings compatible with a septic joint. She went to see Dr. Megan Salon and she is going to have both oral and IV antibiotics which I am sure will cover any infection in the heel. I did not actually see his note today. We are using silver collagen offloading the heel with a heel offloading boot 11/6; patient continues with silver collagen to both wound areas on her plantar left heel and plantar left great toe. She remains on daptomycin by Dr. Megan Salon of infectious disease. She complains of diarrhea. Dr. Megan Salon is aware of this. 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 she has been put on oral vancomycin I think a lot of high clinical suspicion for pseudomembranous colitis Because of the right elbow there are no options to completely off her left foot beyond the heel offloading boot  we have now. Fortunately the left heel ulcer itself has remained static some improvement  in the plantar left great toe Electronic Signature(s) Signed: 09/07/2019 8:54:13 AM By: Linton Ham MD Entered By: Linton Ham on 09/07/2019 07:11:50 -------------------------------------------------------------------------------- Physical Exam Details Patient Name: Date of Service: [name] [name], DEAHL 09/05/2019 3:30 PM Medical Record [id number] Patient Account Number: [id number] Date of Birth/Sex: Treating RN: Dec 22, 1955 (63 y.o. [name] Primary Care Provider: [name] Other Clinician: Referring Provider: Treating Provider/Extender:Erlean Mealor, [place] in Treatment: 28 Constitutional Patient is hypertensive.. Pulse regular and within target range for patient.[name] Kitchen Respirations regular, non-labored and within target range.. Temperature is normal and within the target range for the patient.[name] Kitchen Appears in no distress.she does not look toxic. Respiratory work of breathing is normal. Cardiovascular no evidence of clinical dehydration. pedal pulses are palpablle on the left. Gastrointestinal (GI) exam is quite normal other than tenderness to deep palpation right lower to mid abdomen. There is no guarding no rebound no signs of peritonitis. Integumentary (Hair, Skin) no erythema around the wounds. Psychiatric appears at normal baseline. Notes wound exam; still the same punched out wound on the plantar left heel. There is callus around this. Overall things do not look as though they're deteriorating but no improvement either. I don't believe she is able to offload this adequately. There does not appear to be a vascular issue Still a small punched out area on the left great toe. Callous subcutaneous debridement from this wound (clean up quite nicely. My overall impression is this is smaller. There is no palpable no evidence of infection Electronic  Signature(s) Signed: 09/07/2019 8:54:13 AM By: Linton Ham MD Entered By: Linton Ham on 09/07/2019 07:13:16 -------------------------------------------------------------------------------- Physician Orders Details Patient Name: Date of Service: MERY, GUADALUPE 09/05/2019 3:30 PM Medical Record [id number] Patient Account Number: [id number] Date of Birth/Sex: Treating RN: 02/08/1956 (63 y.o. 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 Dressing Change Frequency Wound #3 Left Calcaneus Change Dressing every other day. Wound #6 Left Toe Great Change Dressing every other day. Wound Cleansing Wound #3 Left Calcaneus May shower and wash wound with soap and water. Wound #6 Left Toe Great May shower and wash wound with soap and water. Primary Wound Dressing Wound #3 Left Calcaneus Silver Collagen - moisten collagen with hydrogel or KY jelly Wound #6 Left Toe Great Silver Collagen - moisten with saline Secondary Dressing Wound #3 Left Calcaneus Kerlix/Rolled Gauze Dry Gauze Other: - felt callous pad Wound #6 Left Toe Great Kerlix/Rolled Gauze Dry Gauze Other: - felt callous pad or foam donut Edema Control Elevate legs to the level of the heart or above for 30 minutes daily and/or when sitting, a frequency of: - throughout the day Off-Loading Other: - use knee scooter or motorized chair , minimal weight bearing left foot Electronic Signature(s) Signed: 09/05/2019 6:04:56 PM By:  Kela Millin Signed: 09/07/2019 8:54:13 AM By: Linton Ham MD Entered By: Kela Millin on 09/05/2019 16:42:05 -------------------------------------------------------------------------------- Problem List Details Patient Name: Date of Service: [name] [name]. 09/05/2019 3:30 PM Medical Record [id number] Patient Account Number: [id number] Date of Birth/Sex: Treating RN: 11-07-1955 (63 y.o. Clearnce Sorrel Primary Care Provider: Carollee Herter, Kendrick Fries  Other Clinician: Referring Provider: Treating Provider/Extender:Aveah Castell, Enedina Finner Weeks in Treatment: 42 Active Problems ICD-10 Evaluated Encounter Code Description Active Date Today Diagnosis L97.423 Non-pressure chronic ulcer of left heel and midfoot 11/12/2018 No Yes with necrosis of muscle L97.522 Non-pressure chronic ulcer of other part of left foot 11/12/2018 No Yes with fat layer exposed E11.621 Type 2 diabetes mellitus with foot ulcer 11/12/2018 No Yes Inactive Problems Resolved Problems Electronic Signature(s) Signed: 09/07/2019 8:54:13 AM By: Linton Ham MD Previous Signature: 09/05/2019 6:04:56 PM Version By: Kela Millin Entered By: Linton Ham on 09/07/2019 06:58:45 -------------------------------------------------------------------------------- Progress Note Details Patient Name: Date of Service: [name] [name] 09/05/2019 3:30 PM Medical Record [id number] Patient Account Number: [id number] Date of Birth/Sex: Treating RN: [birth year] (63 y.o. [name] Primary Care Provider: Carollee Herter, Kendrick Fries Other Clinician: Referring Provider: Treating Provider/Extender:Ferdinando Lodge, Enedina Finner Weeks in Treatment: 16 Subjective History of Present Illness (HPI) ADMISSION 11/12/2018 This is a 63 year old woman with type 2 diabetes and diabetic neuropathy. She has been dealing with a left heel plantar wound for roughly 2 years. She states this started when  she pulled some skin off the area and it progressed into a wound. She also has an area on the tip of her left great toe for 1 year. She has been largely followed by Dr. Sharol Given and she had an excision of bone in the left heel in 2017. I do not see microbiology from this excision or pathology. Apparently this wound never really closed. She most recently has been using Silvadene cream. Offloading this with a scooter. The last MRI I see was in June 2018 which did not show osteomyelitis at that time. Apparently this wound is never really progressed towards healing. During her last review by Dr. Sharol Given in October it was recommended that she undergo a left BKA and she refused. She went to see a second orthopedic consult at [facility] who recommended conservative wound care to see if this will close or progress towards closure but also warned that possible surgery may be necessary. An x-ray that was done at [facility] showed an irregular calcaneal body and calcaneal tuberosity. This is probably postprocedural. Previous x-rays at [facility] had suggested heterotrophic calcifications but I do not see this. Apparently a second ointment was added to the Silvadene which the patient thinks is because some improvement. She has not been systemically unwell. No fever or chills. She thinks the second ointment that was given to her at [facility] has helped somewhat. 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 ABIs done in June 2018 showed a ABI of 1.12 on the right and 1.1 on the left she was biphasic bilaterally. ABIs in our clinic were noncompressible today. 11/20/18 on evaluation today patient is seen for her second visit here in  the office although this is actually the first visit with me concerning an issue that she's having with her great toe and heel of the left foot. Fortunately she does not appear to be have any discomfort at this time and she does have a scooter in order to offload her foot she also has a boot for offloading. With that being said this is something that appears to have been  going on for some time she was seeing Dr. due to his recommendation was that she was going to require a below knee amputation. With that being said she wanted to come to the wound center but according to the patient Dr. Sharol Given told her that "we could not help her". Nonetheless she saw another provider who suggested that it may be worth a shot for [name] to try and help her out if it all possible. Nonetheless she decided that it would be worth a trial since otherwise any the way she's gonna end up with an amputation. Obviously if we can heal the wound then that will not be the case. Again I explained to her that obviously there are no guarantees but we will give this a good try and attempts to get the wound to heal. 1/30; the patient continues to have areas on the left plantar heel and the left plantar great toe. The more worrisome area is the heel. She went for MRI on Saturday but this could not be done because she had silver alginate in the wound bed. I would like to get this rebooked. If she does not have osteomyelitis she will need a total contact cast. We have been using silver alginate in the wounds 2/7; patient has wounds on her left plantar heel and left plantar great toe. The area on the heel has some depth although it looks about the same today. Her MRI will finally be done tomorrow. If she has osteomyelitis in the heel and then we will need to consider her for IV antibiotics and hyperbaric oxygen. If the MRI is negative she will need a total contact cast although she is wearing a cam boot and using a scooter at present. Will be  using silver alginate. She will take it off tomorrow in preparation for the MRI 2/14; the patient's MRI showed extensive surgical changes with a large portion of her calcaneus removed on the left secondary to her previous surgery by Dr. Sharol Given however there is no evidence of osteomyelitis. She would therefore be a candidate for a total contact cast and we applied this for the first time today 2/21; silver collagen total contact cast. The area on the plantar left great toe is "healed" still a lot of callus on this area. Dimensions on the plantar heel not too much different some epithelialization is present however. This is an improvement 12/25/18 on evaluation today patient actually is seen for follow-up concerning issues that she has been having with her cast she states she feels like it's wet and squishy in the bottom of her cast. The reason she does come in to have this evaluated and see what is going on. Fortunately there's no signs of infection at this time was the cast was removed. 3/6; the patient's area on the tip of her right great toe is callused but there is no open area here. She still has the fairly extensive area in the left heel. We have been using silver collagen in the wound. 01/08/19 on evaluation today patient actually appears to be doing very well in regard to her heel ulcer in my pinion to see if you shown signs of improvement which is excellent news. There's no evidence of infection. She continues to have quite a bit of drainage but fortunately again this doesn't seem to be hindering her healing. 3/20 -Patient's foot ulcer on the left appears to be doing well, the dimensions appear encouraging, we have been using total contact cast with Prisma and  will continue doing that 3/27; patient continues to make nice progress on the left plantar heel. She is using silver collagen under a total contact cast. It is been a while since I have seen this wound and it really looks a lot better.  Smaller with healthy granulation 4/3; she continues to make nice progress on the left plantar heel. Using silver collagen under a total contact cast 4/10; left plantar heel. Again skin over the surface of the wound with not much in the way of adherence. This results in undermining. Using silver collagen changed to silver alginate 4/17 left plantar heel. Again not much improvement. There is no undermining today no debridement was required we used silver alginate last time because of excessive moisture 4/24 left plantar heel. Again not as much improvement as I would have liked. About 3 mm of depth. Thick callused skin around the circumference. Using silver alginate 5/1; left plantar heel this is improved this week. Less depth epithelialization is present. Using silver alginate alginate under a total contact cast 5/8; left plantar heel dimensions are about the same. The depth appears to be improved. I use silver collagen starting today under the cast 5/15 left plantar heel. Arrives today with a 2-day history of feeling like something had "slipped" while walking her dog in the cast. Unfortunately extensive area relatively to the small wound of undermining superiorly denuded epithelium. 5/22-Patient returns at 1 week after being taken off the TCC on account of some fluid collection that was debrided and cultured at last visit from the plantar ulcer on the left heel. The culture results are polymicrobial with Klebsiella, enterococcus, Proteus growth these organisms are sensitive to Cipro except with enterococcus which is sensitive to ampicillin but patient is highly allergic to penicillin according to her. 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 5/29; the patient arrived last week with a new wound on her left plantar great toe. With regards to the culture that I did 2 weeks ago of her deteriorating heel wound  this grew Klebsiella and enterococcus. She was given Cipro however the enterococcus would not be covered well by a quinolone. She is allergic to penicillin. I will give her linezolid 600 twice daily x5 days 6/12; the patient continues to have a wound on her left and after she returns to the beach there is undermining laterally. She has the new wound from this 2 weeks or so ago on the plantar tip of her left great toe. She had a fall today scraping the dorsal surface of the left fifth 7/14; READMISSION since the patient was last here she was hospitalized from 04/15/2019 through 04/22/2019. She had presented to an outside ER after falling and hitting her elbow. She had had previous surgery on the elbow and fractures several years ago. An x-ray was negative and she was sent home. She is readmitted with sepsis and acute renal failure secondary to septic arthritis of the elbow. Cultures apparently showed pansensitive staph aureus however she has a severe beta-lactam allergy. She was treated with vancomycin. Apparently the vancomycin is completed and her PICC line is removed although she is seeing Dr. Megan Salon tomorrow due to continued pain and swelling. In the hospital she had an IandD by Dr. [name] of orthopedics. The original surgery was on 04/17/2019 and it was felt that she had septic arthritis. She is still having a lot of pain and swelling in the right  elbow and apparently is due to see Dr. Megan Salon tomorrow and what she thinks is that she will be restarted on antibiotics. With regards to her heel wounds/left foot wounds. Her arterial studies were checked and her ABIs were within normal limits. X-ray showed plantar foot ulcer negative for osteomyelitis postop resection of the posterior calcaneus. She has been using silver alginate to the heel 8/4-Patient returns after being seen on 7/14, we are using silver alginate to the calcaneal wound, she is continuing to receive care for her right elbow  septic arthritis 8/14- Patient returns after 1 week, she is being seen by the surgical group for her right elbow, she is here for the left calcaneal wound for which we are using silver alginate this is about the same 8/21; this is a patient I readmitted to the clinic 5 weeks ago. She is continuing to have difficulties with a septic arthritis of the right elbow she had after a fall and apparently has had surgical IandD's since the last time we saw her. She is also following with Dr. Megan Salon of infectious disease and is apparently on 3 oral antibiotics although at the time of this dictation I am not sure what they are. She comes in today with a necrotic surface on the left great toe with a blister laterally. This was still clearly an open wound. She had purulent drainage coming out of this. The original wound on the plantar aspect of her right heel in the setting of a Charcot foot is deep not open to bone but certainly not any better at all. We have been using silver alginate 8/28; dealing with septic arthritis of the right elbow. May need to go for further surgery here in the second week of September. She had a blister on the left great toe that was purulent [place] draining last week although a culture did not grow anything [already on antibiotics through infectious disease for her elbow]. The punched-out area on her heel is just like it was when she first came in. This almost closed with a total contact cast there are no options for that now. The patient states she cannot stay off her foot having to do housework X-ray of the foot showed a moderate bunion and severe degenerative changes at the first metatarsal phalangeal joint there was a moderate plantar calcaneal spur. We will need to check the location of this. 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 She is apparently on a 3 times daily medication for her MRSA I will need to see what that  is. It is oral. We are using silver collagen to the punched-out area on her left heel and to the summer superficial area of the left great toe. She is offloading this is best she can although judging by the amount of callus it is not enough. She thinks she has enough strength in her right elbow now to use her scooter. 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 Hale Bogus notes and infectious disease. We are using silver collagen to the punched-out areas on her left heel and to the plantar aspect of her left great toe she is now using her scooter to get around and to help offload these areas 10/2; 2-week follow-up. Still on clindamycin I believe  for the left elbow she is going for an MRI of the elbow over the weekend. She is then going to see orthopedics and infectious disease. The area on the left heel is certainly no better. This does not probe to bone however there is green drainage. She has an area on the tip of her toe. The area on the heel is in a wound we almost closed at one point with total contact casting. 10/9; one-week follow-up. Culture I did of the heel last week which was a swab culture admittedly showed moderate Enterococcus faecalis moderate Pseudomonas. The wound itself looks about the same. There is no palpable bone although the depth of it closely approximates bone. The heel is swollen but not erythematous. Her MRI is booked for next week. She also has an MRI of the right elbow. I've also lifted the last consult from Dr. Megan Salon who is following her for septic arthritis of the right elbow. I did not see him specifically comment on her left heel however the patient states that he is aware of this. I did not specifically address the organisms I cultured last week because of the possible effect of any additional  cultures that will be done on the elbow. Additionally these were superficial not bone cultures 10/16; her MRI of the heel was put back to next week. She did have her elbow done. I have been in contact with Dr. Megan Salon about my concerns about osteomyelitis of the left heel. We have been using collagen. She also has a wound on the plantar tip of her first toe 10/23; her MRI of the heel was negative for osteomyelitis but suggested a small abscess. She also had an MRI of her elbow that showed findings compatible with a septic joint. She went to see Dr. Megan Salon and she is going to have both oral and IV antibiotics which I am sure will cover any infection in the heel. I did not actually see his note today. We are using silver collagen offloading the heel with a heel offloading boot 11/6; patient continues with silver collagen to both wound areas on her plantar left heel and plantar left great toe. She remains on daptomycin by Dr. Megan Salon of infectious disease. She complains of diarrhea. Dr. Megan Salon is aware of this. 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 she has been put on oral vancomycin I think a lot of high clinical suspicion for pseudomembranous colitis Because of the right elbow there are no options to completely off her left foot beyond the heel offloading boot we have now. Fortunately the left heel ulcer itself has remained static some improvement in the plantar left great toe Objective Constitutional Patient is hypertensive.. Pulse regular and within target range for patient.[name] Kitchen Respirations regular, non-labored and within target range.. Temperature is normal and within the target range for the patient.[name] Kitchen Appears in no distress.she does not look toxic. Vitals Time Taken: 3:58 PM, Height: 62 in, Weight: 180 lbs, BMI: 32.9, Temperature: 98.2 F, Pulse:  84 bpm, Respiratory Rate: 18 breaths/min, Blood Pressure: 155/69 mmHg. Respiratory work of breathing is normal. Cardiovascular no evidence of clinical dehydration. pedal pulses are palpablle on the left. Gastrointestinal (GI) exam is quite normal other than tenderness to deep palpation right lower to mid abdomen. There is no guarding no rebound no signs of peritonitis. Psychiatric appears at normal baseline. General Notes:  wound exam; still the same punched out wound on the plantar left heel. There is callus around this. Overall things do not look as though they're deteriorating but no improvement either. I don't believe she is able to offload this adequately. There does not appear to be a vascular issue ooStill a small punched out area on the left great toe. Callous subcutaneous debridement from this wound (clean up quite nicely. My overall impression is this is smaller. There is no palpable no evidence of infection Integumentary (Hair, Skin) no erythema around the wounds. Wound #3 status is Open. Original cause of wound was Trauma. The wound is located on the Left Calcaneus. The wound measures 2.6cm length x 2cm width x 0.7cm depth; 4.084cm^2 area and 2.859cm^3 volume. There is Fat Layer (Subcutaneous Tissue) Exposed exposed. There is no tunneling or undermining noted. There is a medium amount of serosanguineous drainage noted. The wound margin is flat and intact. There is large (67-100%) pink granulation within the wound bed. There is a small (1-33%) amount of necrotic tissue within the wound bed including Adherent Slough. Wound #6 status is Open. Original cause of wound was Blister. The wound is located on the Left Toe Great. The wound measures 0.2cm length x 0.2cm width x 0.2cm depth; 0.031cm^2 area and 0.006cm^3 volume. There is Fat Layer (Subcutaneous Tissue) Exposed exposed. There is no tunneling or undermining noted. There is a small amount of serosanguineous drainage noted. The  wound margin is flat and intact. There is large (67-100%) red granulation within the wound bed. There is no necrotic tissue within the wound bed. 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 There was a Selective/Open Wound Skin/Epidermis Debridement with a total area of 0.25 sq cm performed by Ricard Dillon., MD. With the following instrument(s): Curette to remove Viable and Non-Viable tissue/material. Material removed includes Callus and Skin: Epidermis and. No specimens were taken. A time out was conducted at 16:42, prior to the start of the procedure. There was no bleeding. The procedure was tolerated well. Post Debridement Measurements: 0.2cm length x 0.2cm width x 0.2cm depth; 0.006cm^3 volume. Character of Wound/Ulcer Post Debridement is improved. Severity of Tissue Post Debridement is: Fat layer exposed. Post procedure Diagnosis Wound #6: Same as Pre-Procedure Plan Follow-up Appointments: Return Appointment in 2 weeks. Dressing Change Frequency: Wound #3 Left Calcaneus: Change Dressing every other day. Wound #6 Left Toe Great: Change Dressing every other day. Wound Cleansing: Wound #3 Left Calcaneus: May shower and wash wound with soap and water. Wound #6 Left Toe Great: May shower and wash wound with soap and water. Primary Wound Dressing: Wound #3 Left Calcaneus: Silver Collagen - moisten collagen with hydrogel or KY jelly Wound #6 Left Toe Great: Silver Collagen - moisten with saline Secondary Dressing: Wound #3 Left Calcaneus: Kerlix/Rolled Gauze Dry Gauze Other: - felt callous pad Wound  #6 Left Toe Great: Kerlix/Rolled Gauze Dry Gauze Other: - felt callous pad or foam donut Edema Control: Elevate legs to the level of the heart or above for 30 minutes daily and/or when sitting, a frequency of: - throughout the day Off-Loading: Other: - use knee scooter or motorized chair ,  minimal weight bearing left foot #1 chronic wounds on the left heel. We almost had disclosed with a total contact cast however she is certainly not a candidate for that now. Chronic wound. Recent MRI did not show active osteomyelitis in the heel or the toe. She's had a BKA recommended in the past year but she refused.I have continued the collagen although asked her to moisten this with KY jelly rather than saline. The wound bed is very dry #2 I don't believe there is an arterial issue at the bedside although I'll review this the next time I see her. #3 she is having issues with her the daptomycin. Infectious diseases aware of this. Possibility of pseudomembranous colitis and think she has a specimen container to check this. Although her complaints are worrisome she does not look toxic. I think she was put on oral vancomycin out of high clinical suspicion #4 I continue to work aggrressively on the left great toe hoping to get this to close. She has hammertoes on the left including the left great toe at the interphalangeal joint. We don't seem to be up to get the pressure off this area either. Electronic Signature(s) Signed: 09/07/2019 8:54:13 AM By: Linton Ham MD Previous Signature: 09/07/2019 7:14:38 AM Version By: Linton Ham MD Entered By: Linton Ham on 09/07/2019 08:20:24 -------------------------------------------------------------------------------- SuperBill Details Patient Name: Date of Service: [name] [name], FUKUSHIMA 09/05/2019 Medical Record [id number] Patient Account Number: [id number] Date of Birth/Sex: Treating RN: 19-Feb-1956 (63 y.o. 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 L97.522 Non-pressure chronic ulcer of other part of left foot with fat layer exposed E11.621 Type 2 diabetes mellitus with foot ulcer A09 Infectious gastroenteritis and colitis, unspecified Facility Procedures CPT4 Code Description: [id number] - DEBRIDE WOUND 1ST 20 SQ CM OR < ICD-10 Diagnosis Description L97.522 Non-pressure chronic ulcer of other part of left foot with Modifier: fat layer expo Quantity: 1 sed Physician Procedures CPT4 Code Description: [id number] - WC PHYS LEVEL 3 - EST PT ICD-10 Diagnosis Description A09 Infectious gastroenteritis and colitis, unspecified L97.423 Non-pressure chronic ulcer of left heel and midfoot with L97.522 Non-pressure chronic ulcer of  other part of left foot wi E11.621 Type 2 diabetes mellitus with foot ulcer Modifier: 25 necrosis of m th fat layer e Quantity: 1 uscle xposed CPT4 Code Description: [id number] - WC PHYS DEBR WO ANESTH 20 SQ CM ICD-10 Diagnosis Description L97.522 Non-pressure chronic ulcer of other part of left foot with Modifier: fat layer expo Quantity: 1 sed Electronic Signature(s) Signed: 09/07/2019 8:54:13 AM By: Linton Ham MD Previous Signature: 09/05/2019 6:04:56 PM Version By: Kela Millin Entered By: Linton Ham on 09/07/2019 07:16:23

## 2019-09-10 ENCOUNTER — Telehealth: Payer: Self-pay | Admitting: *Deleted

## 2019-09-10 ENCOUNTER — Encounter: Admit: 2019-09-10 | Discharge: 2019-09-10 | Payer: MEDICARE

## 2019-09-10 NOTE — Telephone Encounter (Signed)
Patient called to report that she just received her Vanc tablets as they had to be ordered. She is still having diarrhea and nausea. Advised her to take the medication and if she is not feeling better by Friday to give [name] a call and we may be able give her something to help with the nausea. She agreed with this plan. Also reminded her of her appt 09/15/19.

## 2019-09-11 ENCOUNTER — Encounter: Admit: 2019-09-11 | Discharge: 2019-09-11 | Payer: MEDICARE

## 2019-09-11 ENCOUNTER — Ambulatory Visit: Admit: 2019-09-11 | Discharge: 2019-09-12 | Payer: MEDICARE

## 2019-09-11 DIAGNOSIS — I10 Essential (primary) hypertension: Secondary | ICD-10-CM

## 2019-09-11 DIAGNOSIS — K5900 Constipation, unspecified: Secondary | ICD-10-CM

## 2019-09-11 DIAGNOSIS — E119 Type 2 diabetes mellitus without complications: Secondary | ICD-10-CM

## 2019-09-11 DIAGNOSIS — E559 Vitamin D deficiency, unspecified: Secondary | ICD-10-CM

## 2019-09-11 DIAGNOSIS — E785 Hyperlipidemia, unspecified: Secondary | ICD-10-CM

## 2019-09-11 DIAGNOSIS — G9050 Complex regional pain syndrome I, unspecified: Secondary | ICD-10-CM

## 2019-09-11 DIAGNOSIS — K2970 Gastritis, unspecified, without bleeding: Secondary | ICD-10-CM

## 2019-09-11 DIAGNOSIS — J309 Allergic rhinitis, unspecified: Secondary | ICD-10-CM

## 2019-09-11 DIAGNOSIS — D509 Iron deficiency anemia, unspecified: Principal | ICD-10-CM

## 2019-09-11 DIAGNOSIS — K589 Irritable bowel syndrome without diarrhea: Secondary | ICD-10-CM

## 2019-09-11 DIAGNOSIS — M79642 Pain in left hand: Secondary | ICD-10-CM

## 2019-09-11 DIAGNOSIS — G5600 Carpal tunnel syndrome, unspecified upper limb: Secondary | ICD-10-CM

## 2019-09-11 DIAGNOSIS — D649 Anemia, unspecified: Secondary | ICD-10-CM

## 2019-09-11 DIAGNOSIS — J45909 Unspecified asthma, uncomplicated: Secondary | ICD-10-CM

## 2019-09-11 MED ORDER — FERRIC CARBOXYMALTOSE IVPB
750 mg | Freq: Once | INTRAVENOUS | 0 refills | Status: CN
Start: 2019-09-11 — End: ?

## 2019-09-11 MED ORDER — FERRIC CARBOXYMALTOSE IVPB
750 mg | Freq: Once | INTRAVENOUS | 0 refills | Status: CP
Start: 2019-09-11 — End: ?
  Administered 2019-09-11 (×2): 750 mg via INTRAVENOUS

## 2019-09-11 NOTE — Patient Instructions
Post infusion emergency medical treatment: Go to the closest Emergency Department or call 911. For non-urgent questions or concerns, call [phone] after 0900 the following day(including weekends & holidays). For urgent medical questions, call [phone] and ask to speak to the On-Call Physician covering for the physician prescribing your infusion medication.

## 2019-09-15 ENCOUNTER — Other Ambulatory Visit (HOSPITAL_COMMUNITY)
Admission: RE | Admit: 2019-09-15 | Discharge: 2019-09-15 | Disposition: A | Discharge status: Home / Self Care | Payer: PRIVATE HEALTH INSURANCE | Source: Other Acute Inpatient Hospital | Attending: Internal Medicine | Admitting: Internal Medicine

## 2019-09-15 ENCOUNTER — Encounter: Payer: Self-pay | Admitting: Internal Medicine

## 2019-09-15 ENCOUNTER — Ambulatory Visit: Payer: PRIVATE HEALTH INSURANCE | Admitting: Internal Medicine

## 2019-09-15 ENCOUNTER — Ambulatory Visit (INDEPENDENT_AMBULATORY_CARE_PROVIDER_SITE_OTHER): Payer: PRIVATE HEALTH INSURANCE | Admitting: Internal Medicine

## 2019-09-15 ENCOUNTER — Other Ambulatory Visit: Payer: Self-pay

## 2019-09-15 DIAGNOSIS — M00021 Staphylococcal arthritis, right elbow: Secondary | ICD-10-CM | POA: Diagnosis not present

## 2019-09-15 DIAGNOSIS — T847XXA Infection and inflammatory reaction due to other internal orthopedic prosthetic devices, implants and grafts, initial encounter: Secondary | ICD-10-CM | POA: Insufficient documentation

## 2019-09-15 DIAGNOSIS — M86171 Other acute osteomyelitis, right ankle and foot: Secondary | ICD-10-CM | POA: Insufficient documentation

## 2019-09-15 DIAGNOSIS — L089 Local infection of the skin and subcutaneous tissue, unspecified: Secondary | ICD-10-CM | POA: Diagnosis not present

## 2019-09-15 DIAGNOSIS — E11628 Type 2 diabetes mellitus with other skin complications: Secondary | ICD-10-CM

## 2019-09-15 DIAGNOSIS — N390 Urinary tract infection, site not specified: Secondary | ICD-10-CM | POA: Diagnosis present

## 2019-09-15 DIAGNOSIS — R197 Diarrhea, unspecified: Secondary | ICD-10-CM

## 2019-09-15 DIAGNOSIS — E11621 Type 2 diabetes mellitus with foot ulcer: Secondary | ICD-10-CM | POA: Insufficient documentation

## 2019-09-15 LAB — BASIC METABOLIC PANEL
Anion gap: 7 (ref 5–15)
BUN: 23 mg/dL (ref 8–23)
CO2: 27 mmol/L (ref 22–32)
Calcium: 9.2 mg/dL (ref 8.9–10.3)
Chloride: 100 mmol/L (ref 98–111)
Creatinine, Ser: 1.9 mg/dL — ABNORMAL HIGH (ref 0.44–1.00)
GFR calc Af Amer: 32 mL/min — ABNORMAL LOW (ref >60)
GFR calc non Af Amer: 28 mL/min — ABNORMAL LOW (ref >60)
Glucose, Bld: 441 mg/dL — ABNORMAL HIGH (ref 70–99)
Potassium: 5 mmol/L (ref 3.5–5.1)
Sodium: 134 mmol/L — ABNORMAL LOW (ref 135–145)

## 2019-09-15 LAB — CBC
HCT: 34.8 % — ABNORMAL LOW (ref 36.0–46.0)
Hemoglobin: 10.8 g/dL — ABNORMAL LOW (ref 12.0–15.0)
MCH: 26.9 pg (ref 26.0–34.0)
MCHC: 31 g/dL (ref 30.0–36.0)
MCV: 86.8 fL (ref 80.0–100.0)
Platelets: 107 10*3/uL — ABNORMAL LOW (ref 150–400)
RBC: 4.01 MIL/uL (ref 3.87–5.11)
RDW: 16.1 % — ABNORMAL HIGH (ref 11.5–15.5)
WBC: 5 10*3/uL (ref 4.0–10.5)
nRBC: 0 % (ref 0.0–0.2)

## 2019-09-15 LAB — SEDIMENTATION RATE: Sed Rate: 53 mm/hr — ABNORMAL HIGH (ref 0–22)

## 2019-09-15 LAB — CK: Total CK: 108 U/L (ref 38–234)

## 2019-09-15 LAB — C-REACTIVE PROTEIN: CRP: 0.9 mg/dL (ref <1.0)

## 2019-09-15 MED ORDER — ONDANSETRON HCL 4 MG PO TABS: 4.0000 mg | ORAL_TABLET | Freq: Three times a day (TID) | ORAL | 1 refills | Status: DC | PRN

## 2019-09-15 NOTE — Assessment & Plan Note (Signed)
She may be having some improvement in her chronic left heel wound on her current antibiotics.  I will continue IV daptomycin and oral metronidazole but stop levofloxacin now given her ongoing diarrhea.

## 2019-09-15 NOTE — Assessment & Plan Note (Addendum)
Still very concerned about antibiotic associated new, C. difficile colitis causing her nausea diarrhea.  We have given her another stool specimen cup.  I will have her stop levofloxacin now since it may be the trigger for her diarrhea (in addition to her recent prolonged course of clindamycin).  She will continue oral metronidazole (doing double duty for her left foot infection and diarrhea) and vancomycin.  I will prescribe ondansetron for her nausea and see her back in 1 week.

## 2019-09-15 NOTE — Progress Notes (Signed)
[facility]  Patient Active Problem List   Diagnosis Date Noted  . Septic arthritis of elbow, right ([place]) 04/17/2019    Priority: High  . Ulcer of left heel, limited to breakdown of skin ([place]) 02/01/2017    Priority: Medium  . Diarrhea 09/04/2019  . Pressure injury of skin 04/17/2019  . 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 Acute on chronic renal insufficiency 04/15/2019  . Sleep apnea 11/16/2017  . Diabetic foot infection ([place]) 04/06/2017  . Type 2 diabetes mellitus with hyperglycemia, with long-term current use of insulin ([place]) 04/06/2017  . Neuropathy 05/05/2015  . Diabetic peripheral neuropathy ([place]) 01/12/2015  . Infection associated with orthopedic device ([place]) 12/11/2014  . CKD (chronic kidney disease), stage III ([place]) 05/27/2014  . History of MI (myocardial infarction) 10/06/2013  . Obesity (BMI 30-39.9) 09/20/2013  . 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 Hyponatremia 02/03/2013  . Normocytic anemia 02/03/2013  . CAD (coronary artery disease) 01/11/2012  . Hepatic cirrhosis ([place]) 07/06/2010  . 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  . Hyperlipidemia 06/03/2008  . Essential hypertension 12/23/2006    Patient's Medications  New Prescriptions   ONDANSETRON (ZOFRAN) 4 MG TABLET    Take 1 tablet (4 mg total) by mouth every 8 (eight) hours as needed for nausea or vomiting.  Previous Medications   AMINO ACIDS-PROTEIN HYDROLYS (FEEDING SUPPLEMENT, PRO-STAT SUGAR FREE 64,) LIQD    Take 30 mLs by mouth 2 (two) times daily.   ASPIRIN EC 81 MG EC TABLET    Take 1 tablet (81 mg total) by mouth 2 (two) times daily.   ATORVASTATIN (LIPITOR) 10 MG TABLET    Take 1 tablet by mouth once daily   CALCIUM ALGINATE EX    Apply 1 patch topically every other day.   CYCLOBENZAPRINE (FLEXERIL) 10 MG TABLET    Take 1 tablet (10 mg total) by mouth 3 (three) times daily as needed for up to 15 doses for muscle spasms.   DAPTOMYCIN 661 MG IN  SODIUM CHLORIDE 0.9 % 100 ML    Inject 661 mg into the vein daily at 8 pm.   DOCUSATE SODIUM (COLACE) 100 MG CAPSULE    Take 100 mg by mouth daily.   DULOXETINE (CYMBALTA) 60 MG CAPSULE    Take 1 capsule by mouth once daily   ENSURE MAX PROTEIN (ENSURE MAX PROTEIN) LIQD    Take 330 mLs (11 oz total) by mouth 2 (two) times daily.   EPINEPHRINE 0.3 MG/0.3 ML IJ SOAJ INJECTION    Inject into the muscle.   FERROUS SULFATE (IRON) 325 (65 FE) MG TABS    Take 1 tablet by mouth daily.    FLUTICASONE (FLONASE) 50 MCG/ACT NASAL SPRAY    Place 2 sprays into both nostrils daily.   GUAIFENESIN (MUCINEX) 600 MG 12 HR TABLET    Take 1 tablet (600 mg total) by mouth 2 (two) times daily.   HYDROCHLOROTHIAZIDE (HYDRODIURIL) 25 MG TABLET    Take 1 tablet (25 mg total) by mouth daily. RESUME AFTER EVALUATED BY PCP   HYDROCODONE-ACETAMINOPHEN (NORCO/VICODIN) 5-325 MG TABLET    Take 1 tablet by mouth every 6 (six) hours as needed for moderate pain.   INSULIN GLARGINE (LANTUS SOLOSTAR) 100 UNIT/ML SOLOSTAR PEN    INJECT 20 UNITS SUBCUTANEOUSLY ONCE DAILY IN THE MORNING AND THEN INJECT 40 UNITS AT BEDTIME   INSULIN LISPRO (HUMALOG KWIKPEN) 200 UNIT/ML SOPN    Inject 13 Units  into the skin daily.   METRONIDAZOLE (FLAGYL) 500 MG TABLET    Take 1 tablet (500 mg total) by mouth 3 (three) times daily.   MULTIPLE VITAMINS-MINERALS (MULTIVITAMIN GUMMIES WOMENS) CHEW    Chew 2 each by mouth daily.   NUTRITIONAL SUPPLEMENTS (FEEDING SUPPLEMENT, NEPRO CARB STEADY,) LIQD    Take 237 mLs by mouth 3 (three) times daily as needed (Supplement).   NYSTATIN (MYCOSTATIN/NYSTOP) POWDER    Apply topically 3 (three) times daily.   ONDANSETRON (ZOFRAN ODT) 4 MG DISINTEGRATING TABLET    Take 1 tablet (4 mg total) by mouth every 8 (eight) hours as needed for nausea or vomiting.   POLYETHYLENE GLYCOL (MIRALAX / GLYCOLAX) PACKET    Take 17 g by mouth daily as needed for mild constipation.    PREGABALIN (LYRICA) 150 MG CAPSULE    Take 1 capsule  (150 mg total) by mouth 2 (two) times daily.   VANCOMYCIN (VANCOCIN) 125 MG CAPSULE    Take 1 capsule (125 mg total) by mouth 4 (four) times daily.  Modified Medications   No medications on file  Discontinued Medications   LEVOFLOXACIN (LEVAQUIN) 250 MG TABLET    Take 1 tablet (250 mg total) by mouth daily.    Subjective: [name] [name] is in for her routine follow-up visit..  She had a new central line placed on 08/28/2019 and started daily IV daptomycin, oral levofloxacin and metronidazole.  Shortly afterwards she began to have daily bouts of watery diarrhea.  She is also had some mild headache.  She has had nausea and anorexia and is losing weight.  She has not had any fever.  She has noted improvement in her right elbow swelling and drainage and her husband told her that her left heel wound seems to be getting smaller.  So far Dr. [name] has not been able to locate a orthopedic surgeon at one of the local academic medical centers to take over her care.  She says that her husband did bring in a stool specimen on 09/11/2019 but, unfortunately, it never got to our lab.  She did start on oral vancomycin 1 week ago.  She has had some improvement in her diarrhea.  Review of Systems: Review of Systems  Constitutional: Positive for malaise/fatigue and weight loss. Negative for fever.  Respiratory: Negative for cough and shortness of breath.   Cardiovascular: Negative for chest pain.  Gastrointestinal: Positive for diarrhea and nausea. Negative for abdominal pain and vomiting.  Musculoskeletal: Positive for joint pain.    Past Medical History:  Diagnosis Date  . Acute MI [facility]) 2007   presented to ED & had cardiac cath- but found to have normal coronaries. Since that point in time her PCP cares f or cardiac needs. Dr. [name] - [facility]  . Anemia   . Anginal pain ([place])   . Anxiety   . Asthma   . Bulging lumbar disc   . Cataract   . Chronic kidney disease    "had transplant when I was 15;  doesn't bother me now" (03/20/2013)  . Cirrhosis of liver without mention of alcohol   . Constipation   . Dehiscence of closure of skin    left partial calcaneal excision  . Depression   . Diabetes mellitus    insulin dependent, adult onset  . Episode of visual loss of left eye   . Exertional shortness of breath   . Fatty liver   . Fibromyalgia   . GERD (gastroesophageal reflux disease)   .  Hepatic steatosis   . High cholesterol   . Hypertension   . MRSA (methicillin resistant Staphylococcus aureus)   . Neuropathy    lower legs  . Osteoarthritis    hands, hips  . Proximal humerus fracture 10/15/12   Left  . PTSD (post-traumatic stress disorder)   . Renal insufficiency 05/05/2015    Social History   Tobacco Use  . Smoking status: Never Smoker  . Smokeless tobacco: Never Used  Substance Use Topics  . Alcohol use: No    Alcohol/week: 0.0 standard drinks  . Drug use: No    Family History  Problem Relation Age of Onset  . Heart disease Father   . Diabetes Father   . Colitis Father   . Crohn's disease Father   . Cancer Father        leukemia  . Leukemia Father   . Diabetes Mellitus II Brother   . Kidney disease Brother   . Heart disease Brother   . Diabetes Mother   . Hypertension Mother   . Mental illness Mother   . Irritable bowel syndrome Daughter   . Diabetes Mellitus II Brother   . Kidney disease Brother   . Liver disease Brother   . Kidney disease Brother   . Heart attack Brother   . Diabetes Mellitus II Brother   . Heart disease Brother   . Liver disease Brother   . Kidney disease Brother   . Kidney disease Brother   . Diabetes Mellitus II Brother   . Diabetes Mellitus I Brother     Allergies  Allergen Reactions  . Bee Pollen Anaphylaxis  . Fish-Derived Products Hives, Shortness Of Breath, Swelling and Rash    Hives get in throat causing trouble breathing  . Mushroom Extract Complex Anaphylaxis  . Penicillins Anaphylaxis    Did it involve  swelling of the face/tongue/throat, SOB, or low BP? Yes Did it involve sudden or severe rash/hives, skin peeling, or any reaction on the inside of your mouth or nose? No Did you need to seek medical attention at a hospital or doctor's office? Yes When did it last happen?A few months ago If all above answers are "NO", may proceed with cephalosporin use.  [name] Kitchen Rosemary Oil Anaphylaxis  . Shellfish Allergy Hives, Shortness Of Breath, Swelling and Rash  . Tomato Hives and Shortness Of Breath    Hives in throat causes her trouble breathing  . Acetaminophen Other (See Comments)    GI upset  . Acyclovir And Related Other (See Comments)    Unknown rxn  . Aloe Vera Hives  . Broccoli [Brassica Oleracea] Hives  . Naproxen Other (See Comments)    Unknown rxn    Objective: Vitals:   09/15/19 1547  BP: (!) 144/75  Pulse: 92   There is no height or weight on file to calculate BMI.  Physical Exam Constitutional:      Comments: She believes she has lost about 15 pounds.  Chest:    Abdominal:     Palpations: Abdomen is soft.     Tenderness: There is no abdominal tenderness.  Musculoskeletal:     Comments: She has a clean dry dressings on her right elbow and left foot.[name] Kitchen  Psychiatric:        Mood and Affect: Mood normal.     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  She and her husband have noted some decreasing drainage following 2-1/2 weeks of IV daptomycin.  She will continue daptomycin.  Hopefully referral will be made for a second opinion orthopedic evaluation soon.  She will follow-up here in 1 week.        Unprioritized   Diarrhea    Still very concerned about antibiotic associated new, C. difficile  colitis causing her nausea diarrhea.  We have given her another stool specimen cup.  I will have her stop levofloxacin now since it may be the trigger for her diarrhea (in addition to her recent prolonged course of clindamycin).  She will continue oral metronidazole (doing double duty for her left foot infection and diarrhea) and vancomycin.  I will prescribe ondansetron for her nausea and see her back in 1 week.      Relevant Medications   ondansetron (ZOFRAN) 4 MG tablet   Diabetic foot infection ([place])    She may be having some improvement in her chronic left heel wound on her current antibiotics.  I will continue IV daptomycin and oral metronidazole but stop levofloxacin now given her ongoing diarrhea.          Michel Bickers, MD [facility] [place] [facility] [phone] pager   [phone] cell 09/15/2019, 4:20 PM

## 2019-09-15 NOTE — Assessment & Plan Note (Signed)
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  She and her husband have noted some decreasing drainage following 2-1/2 weeks of IV daptomycin.  She will continue daptomycin.  Hopefully referral will be made for a second opinion orthopedic evaluation soon.  She will follow-up here in 1 week.

## 2019-09-16 ENCOUNTER — Telehealth: Payer: Self-pay | Admitting: *Deleted

## 2019-09-16 NOTE — Telephone Encounter (Signed)
Patient called to clarify her medications after her visit yesterday.  Per Dr Megan Salon, she is to STOP levaquin.  She is to CONTINUE IV daptomycin, oral metronidazole, oral vancomycin.  She is to START oral ondansetron for her nausea.  Patient verbalized understanding, states her husband is picking up ondansetron today. Landis Gandy, RN

## 2019-09-18 ENCOUNTER — Other Ambulatory Visit: Payer: Self-pay

## 2019-09-18 ENCOUNTER — Ambulatory Visit (INDEPENDENT_AMBULATORY_CARE_PROVIDER_SITE_OTHER): Payer: PRIVATE HEALTH INSURANCE | Admitting: Family Medicine

## 2019-09-18 ENCOUNTER — Other Ambulatory Visit (HOSPITAL_COMMUNITY)
Admission: RE | Admit: 2019-09-18 | Discharge: 2019-09-18 | Disposition: A | Discharge status: Home / Self Care | Payer: PRIVATE HEALTH INSURANCE | Source: Ambulatory Visit | Attending: Family Medicine | Admitting: Family Medicine

## 2019-09-18 ENCOUNTER — Encounter: Payer: Self-pay | Admitting: Family Medicine

## 2019-09-18 VITALS — BP 133/64 | HR 85 | Temp 96.1°F | Resp 16 | Ht 61.0 in | Wt 193.6 lb

## 2019-09-18 DIAGNOSIS — B356 Tinea cruris: Secondary | ICD-10-CM

## 2019-09-18 DIAGNOSIS — N898 Other specified noninflammatory disorders of vagina: Secondary | ICD-10-CM | POA: Diagnosis not present

## 2019-09-18 DIAGNOSIS — S42401A Unspecified fracture of lower end of right humerus, initial encounter for closed fracture: Secondary | ICD-10-CM

## 2019-09-18 DIAGNOSIS — M797 Fibromyalgia: Secondary | ICD-10-CM | POA: Diagnosis not present

## 2019-09-18 DIAGNOSIS — M79672 Pain in left foot: Secondary | ICD-10-CM

## 2019-09-18 MED ORDER — NAFTIFINE HCL 2 % EX CREA: TOPICAL_CREAM | CUTANEOUS | 2 refills | Status: DC

## 2019-09-18 MED ORDER — FLUCONAZOLE 150 MG PO TABS: ORAL_TABLET | ORAL | 0 refills | Status: DC

## 2019-09-18 MED ORDER — HYDROCODONE-ACETAMINOPHEN 5-325 MG PO TABS: 1.0000 | ORAL_TABLET | Freq: Four times a day (QID) | ORAL | 0 refills | Status: DC | PRN

## 2019-09-18 NOTE — Patient Instructions (Signed)
Body Ringworm Body ringworm is an infection of the skin that often causes a ring-shaped rash. Body ringworm is also called tinea corporis. Body ringworm can affect any part of your skin. This condition is easily spread from person to person (is very contagious). What are the causes? This condition is caused by fungi called dermatophytes. The condition develops when these fungi grow out of control on the skin. You can get this condition if you touch a person or animal that has it. You can also get it if you share any items with an infected person or pet. These include:  Clothing, bedding, and towels.  Brushes or combs.  Gym equipment.  Any other object that has the fungus on it. What increases the risk? You are more likely to develop this condition if you:  Play sports that involve close physical contact, such as wrestling.  Sweat a lot.  Live in areas that are hot and humid.  Use public showers.  Have a weakened immune system. What are the signs or symptoms? Symptoms of this condition include:  Itchy, raised red spots and bumps.  Red scaly patches.  A ring-shaped rash. The rash may have: ? A clear center. ? Scales or red bumps at its center. ? Redness near its borders. ? Dry and scaly skin on or around it. How is this diagnosed? This condition can usually be diagnosed with a skin exam. A skin scraping may be taken from the affected area and examined under a microscope to see if the fungus is present. How is this treated? This condition may be treated with:  An antifungal cream or ointment.  An antifungal shampoo.  Antifungal medicines. These may be prescribed if your ringworm: ? Is severe. ? Keeps coming back. ? Lasts a long time. Follow these instructions at home:  Take over-the-counter and prescription medicines only as told by your health care provider.  If you were given an antifungal cream or ointment: ? Use it as told by your health care provider. ? Wash  the infected area and dry it completely before applying the cream or ointment.  If you were given an antifungal shampoo: ? Use it as told by your health care provider. ? Leave the shampoo on your body for 3-5 minutes before rinsing.  While you have a rash: ? Wear loose clothing to stop clothes from rubbing and irritating it. ? Wash or change your bed sheets every night. ? Disinfect or throw out items that may be infected. ? Wash clothes and bed sheets in hot water. ? Wash your hands often with soap and water. If soap and water are not available, use hand sanitizer.  If your pet has the same infection, take your pet to see a veterinarian for treatment. How is this prevented?  Take a bath or shower every day and after every time you work out or play sports.  Dry your skin completely after bathing.  Wear sandals or shoes in public places and showers.  Change your clothes every day.  Wash athletic clothes after each use.  Do not share personal items with others.  Avoid touching red patches of skin on other people.  Avoid touching pets that have bald spots.  If you touch an animal that has a bald spot, wash your hands. Contact a health care provider if:  Your rash continues to spread after 7 days of treatment.  Your rash is not gone in 4 weeks.  The area around your rash gets red, warm,  tender, and swollen. Summary  Body ringworm is an infection of the skin that often causes a ring-shaped rash.  This condition is easily spread from person to person (is very contagious).  This condition may be treated with antifungal cream or ointment, antifungal shampoo, or antifungal medicines.  Take over-the-counter and prescription medicines only as told by your health care provider. This information is not intended to replace advice given to you by your health care provider. Make sure you discuss any questions you have with your health care provider. Document Released: 10/13/2000  Document Revised: 06/14/2018 Document Reviewed: 06/14/2018 Elsevier Patient Education  2020 [employer].

## 2019-09-18 NOTE — Progress Notes (Signed)
[name], female    DOB: 1956-01-01  Age: 63 y.o. MRN: [id number]    Subjective:  Subjective  HPI Tannya Kewanna Kasprzak presents for yeast infection in groin and vag d/c.   No abd pain   No other symptoms   Review of Systems  Constitutional: Negative for appetite change, diaphoresis, fatigue and unexpected weight change.  Eyes: Negative for pain, redness and visual disturbance.  Respiratory: Negative for cough, chest tightness, shortness of breath and wheezing.   Cardiovascular: Negative for chest pain, palpitations and leg swelling.  Endocrine: Negative for cold intolerance, heat intolerance, polydipsia, polyphagia and polyuria.  Genitourinary: Positive for vaginal discharge. Negative for difficulty urinating, dysuria and frequency.  Skin: Positive for rash.  Neurological: Negative for dizziness, light-headedness, numbness and headaches.    History Past Medical History:  Diagnosis Date  . Acute MI [facility]) 2007   presented to ED & had cardiac cath- but found to have normal coronaries. Since that point in time her PCP cares f or cardiac needs. Dr. [name] - [facility]  . Anemia   . Anginal pain ([place])   . Anxiety   . Asthma   . Bulging lumbar disc   . Cataract   . 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 Cirrhosis of liver without mention of alcohol   . Constipation   . Dehiscence of closure of skin    left partial calcaneal excision  . Depression   . Diabetes mellitus    insulin dependent, adult onset  . Episode of visual loss of left eye   . Exertional shortness of breath   . Fatty liver   . Fibromyalgia   . GERD (gastroesophageal reflux disease)   . Hepatic steatosis   . High cholesterol   . Hypertension   . MRSA (methicillin resistant Staphylococcus aureus)   . Neuropathy    lower legs  . Osteoarthritis    hands, hips  . Proximal humerus fracture 10/15/12   Left  . PTSD (post-traumatic  stress disorder)   . 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   Her family history includes Cancer in her father; Colitis in her father; Crohn's disease in her father; Diabetes in her father and mother; Diabetes Mellitus I in her brother; Diabetes Mellitus II in her brother, brother, brother, and brother; Heart attack in her brother; Heart disease in her brother, brother, and father; Hypertension in her mother; Irritable bowel syndrome in her daughter; Kidney disease in her brother, brother, brother, brother, and brother; Leukemia in her father; Liver disease in her brother and brother; Mental illness in her mother.She reports that she has never smoked. She has never used smokeless tobacco. She reports that she does not drink alcohol or use drugs.  Current Outpatient Medications on File Prior to Visit  Medication Sig Dispense Refill  . Amino  Acids-Protein Hydrolys (FEEDING SUPPLEMENT, PRO-STAT SUGAR FREE 64,) LIQD Take 30 mLs by mouth 2 (two) times daily. 887 mL 0  . aspirin EC 81 MG EC tablet Take 1 tablet (81 mg total) by mouth 2 (two) times daily. 28 tablet 0  . atorvastatin (LIPITOR) 10 MG tablet Take 1 tablet by mouth once daily 90 tablet 1  . CALCIUM ALGINATE EX Apply 1  patch topically every other day.    . cyclobenzaprine (FLEXERIL) 10 MG tablet Take 1 tablet (10 mg total) by mouth 3 (three) times daily as needed for up to 15 doses for muscle spasms. 15 tablet 0  . DAPTOmycin 661 mg in sodium chloride 0.9 % 100 mL Inject 661 mg into the vein daily at 8 pm.    . docusate sodium (COLACE) 100 MG capsule Take 100 mg by mouth daily.    . DULoxetine (CYMBALTA) 60 MG capsule Take 1 capsule by mouth once daily 90 capsule 1  . Ensure Max Protein (ENSURE MAX PROTEIN) LIQD Take 330 mLs (11 oz total) by mouth 2 (two) times daily. 3300 mL 0  . EPINEPHrine 0.3 mg/0.3 mL IJ SOAJ injection Inject into the muscle.    . Ferrous Sulfate (IRON) 325 (65 Fe) MG TABS Take 1 tablet by mouth daily.     . fluticasone (FLONASE) 50 MCG/ACT nasal spray Place 2 sprays into both nostrils daily. 16 g 6  . guaiFENesin (MUCINEX) 600 MG 12 hr tablet Take 1 tablet (600 mg total) by mouth 2 (two) times daily. 10 tablet 0  . hydrochlorothiazide (HYDRODIURIL) 25 MG tablet Take 1 tablet (25 mg total) by mouth daily. RESUME AFTER EVALUATED BY PCP 90 tablet 1  . Insulin Glargine (LANTUS SOLOSTAR) 100 UNIT/ML Solostar Pen INJECT 20 UNITS SUBCUTANEOUSLY ONCE DAILY IN THE MORNING AND THEN INJECT 40 UNITS AT BEDTIME 45 mL 0  . Insulin Lispro (HUMALOG KWIKPEN) 200 UNIT/ML SOPN Inject 13 Units into the skin daily. 7 pen 3  . metroNIDAZOLE (FLAGYL) 500 MG tablet Take 1 tablet (500 mg total) by mouth 3 (three) times daily. 90 tablet 1  . Multiple Vitamins-Minerals (MULTIVITAMIN GUMMIES WOMENS) CHEW Chew 2 each by mouth daily.    . Nutritional Supplements (FEEDING SUPPLEMENT, NEPRO CARB STEADY,) LIQD Take 237 mLs by mouth 3 (three) times daily as needed (Supplement). 2000 mL 0  . nystatin (MYCOSTATIN/NYSTOP) powder Apply topically 3 (three) times daily. 15 g 0  . ondansetron (ZOFRAN ODT) 4 MG disintegrating tablet Take 1 tablet (4 mg total) by mouth every 8 (eight) hours as needed for nausea or vomiting. 20 tablet  0  . ondansetron (ZOFRAN) 4 MG tablet Take 1 tablet (4 mg total) by mouth every 8 (eight) hours as needed for nausea or vomiting. 60 tablet 1  . polyethylene glycol (MIRALAX / GLYCOLAX) packet Take 17 g by mouth daily as needed for mild constipation.     . pregabalin (LYRICA) 150 MG capsule Take 1 capsule (150 mg total) by mouth 2 (two) times daily. 180 capsule 1  . vancomycin (VANCOCIN) 125 MG capsule Take 1 capsule (125 mg total) by mouth 4 (four) times daily. 56 capsule 1  . [DISCONTINUED] metFORMIN (GLUCOPHAGE) 1000 MG tablet Take 1,000 mg by mouth 2 (two) times daily with a meal.      . [DISCONTINUED] omeprazole (PRILOSEC) 20 MG capsule Take 20 mg by mouth daily.       No current facility-administered medications on file prior to visit.      Objective:  Objective  Physical Exam Vitals signs and nursing note reviewed.  Constitutional:      Appearance: She is well-developed.  HENT:     Head: Normocephalic and atraumatic.  Eyes:     Conjunctiva/sclera: Conjunctivae normal.  Neck:     Musculoskeletal: Normal range of motion and neck supple.     Thyroid: No thyromegaly.     Vascular: No carotid bruit or JVD.  Cardiovascular:     Rate and Rhythm: Normal rate and regular rhythm.     Heart sounds: Normal heart sounds. No murmur.  Pulmonary:     Effort: Pulmonary effort is normal. No respiratory distress.     Breath sounds: Normal breath sounds. No wheezing or rales.  Chest:     Chest wall: No tenderness.  Skin:    Findings: Erythema and rash present.       Neurological:     Mental Status: She is alert and oriented to person, place, and time.    BP 133/64 (BP Location: Left Arm, Patient Position: Sitting, Cuff Size: Small)   Pulse 85   Temp (!) 96.1 F (35.6 C) (Temporal)   Resp 16   Ht 5' 1"  (1.549 m)   Wt 193 lb 9.6 oz (87.8 kg)   SpO2 100%   BMI 36.58 kg/m  Wt Readings from Last 3 Encounters:  09/18/19 193 lb 9.6 oz (87.8 kg)  09/04/19 180 lb (81.6 kg)  08/28/19  180 lb (81.6 kg)     Lab Results  Component Value Date   WBC 5.0 09/15/2019   HGB 10.8 (L) 09/15/2019   HCT 34.8 (L) 09/15/2019   PLT 107 (L) 09/15/2019   GLUCOSE 441 (H) 09/15/2019   CHOL 175 11/26/2018   TRIG 168.0 (H) 11/26/2018   HDL 33.00 (L) 11/26/2018   LDLDIRECT 115.8 01/23/2012   LDLCALC 108 (H) 11/26/2018   ALT 15 05/01/2019   AST 21 05/01/2019   NA 134 (L) 09/15/2019   K 5.0 09/15/2019   CL 100 09/15/2019   CREATININE 1.90 (H) 09/15/2019   BUN 23 09/15/2019   CO2 27 09/15/2019   TSH 1.65 01/03/2016   INR 1.1 04/19/2019   HGBA1C 12.2 (H) 04/17/2019   MICROALBUR 26.3 (H) 11/26/2018    No results found.   Assessment & Plan:  Plan  I am having Acadia R. Nedd start on fluconazole and Naftifine HCl. I am also having her maintain her polyethylene glycol, Insulin Lispro, ondansetron, cyclobenzaprine, Iron, docusate sodium, CALCIUM ALGINATE EX, Multivitamin Gummies Womens, hydrochlorothiazide, Ensure Max Protein, feeding supplement (NEPRO CARB STEADY), feeding supplement (PRO-STAT SUGAR FREE 64), guaiFENesin, aspirin, fluticasone, pregabalin, nystatin, Lantus SoloStar, DULoxetine, atorvastatin, EPINEPHrine, DAPTOmycin 661 mg in sodium chloride 0.9 % 100 mL, metroNIDAZOLE, vancomycin, ondansetron, and HYDROcodone-acetaminophen.  Meds ordered this encounter  Medications  . fluconazole (DIFLUCAN) 150 MG tablet    Sig: 1 po x1, may repeat in 3 days prn    Dispense:  2 tablet    Refill:  0  . Naftifine HCl 2 % CREA    Sig: Apply qd for up to 4 weeks    Dispense:  60 g    Refill:  2  . HYDROcodone-acetaminophen (NORCO/VICODIN) 5-325 MG tablet    Sig: Take 1 tablet by mouth every 6 (six) hours as needed for moderate pain.    Dispense:  120 tablet    Refill:  0    Problem List Items Addressed This Visit    None    Visit Diagnoses    Vaginal discharge    -  Primary   Relevant Medications   fluconazole (DIFLUCAN) 150 MG tablet   Other Relevant Orders   Urine  cytology ancillary only([place])   Tinea cruris       Relevant Medications   fluconazole (DIFLUCAN) 150 MG tablet   Naftifine HCl 2 % CREA   Fibromyalgia       Relevant Medications   HYDROcodone-acetaminophen (NORCO/VICODIN) 5-325 MG tablet   Left foot pain       Relevant Medications   HYDROcodone-acetaminophen (NORCO/VICODIN) 5-325 MG tablet   Closed fracture of right elbow, initial encounter       Relevant Medications   HYDROcodone-acetaminophen (NORCO/VICODIN) 5-325 MG tablet      Follow-up: No follow-ups on file.  Ann Held, DO

## 2019-09-19 ENCOUNTER — Encounter (HOSPITAL_BASED_OUTPATIENT_CLINIC_OR_DEPARTMENT_OTHER): Payer: PRIVATE HEALTH INSURANCE | Admitting: Internal Medicine

## 2019-09-19 ENCOUNTER — Telehealth: Payer: Self-pay

## 2019-09-19 DIAGNOSIS — E11621 Type 2 diabetes mellitus with foot ulcer: Secondary | ICD-10-CM | POA: Diagnosis not present

## 2019-09-19 NOTE — Telephone Encounter (Signed)
PA initiated via Covermymeds; KEY: [id number]. PA approved.   Request Reference Number: [id number]. HYDROCO/APAP TAB 5-325MG is approved through 03/18/2020. For further questions, call [phone].

## 2019-09-22 ENCOUNTER — Other Ambulatory Visit (HOSPITAL_COMMUNITY)
Admission: RE | Admit: 2019-09-22 | Discharge: 2019-09-22 | Disposition: A | Discharge status: Home / Self Care | Payer: PRIVATE HEALTH INSURANCE | Source: Other Acute Inpatient Hospital | Attending: Internal Medicine | Admitting: Internal Medicine

## 2019-09-22 ENCOUNTER — Other Ambulatory Visit: Payer: Self-pay | Admitting: Family Medicine

## 2019-09-22 ENCOUNTER — Encounter: Admit: 2019-09-22 | Discharge: 2019-09-22 | Payer: MEDICARE

## 2019-09-22 DIAGNOSIS — N390 Urinary tract infection, site not specified: Secondary | ICD-10-CM | POA: Insufficient documentation

## 2019-09-22 DIAGNOSIS — T847XXA Infection and inflammatory reaction due to other internal orthopedic prosthetic devices, implants and grafts, initial encounter: Secondary | ICD-10-CM | POA: Diagnosis present

## 2019-09-22 DIAGNOSIS — M86171 Other acute osteomyelitis, right ankle and foot: Secondary | ICD-10-CM | POA: Insufficient documentation

## 2019-09-22 DIAGNOSIS — E11621 Type 2 diabetes mellitus with foot ulcer: Secondary | ICD-10-CM | POA: Diagnosis present

## 2019-09-22 DIAGNOSIS — Z1231 Encounter for screening mammogram for malignant neoplasm of breast: Secondary | ICD-10-CM

## 2019-09-22 LAB — CBC
HCT: 36.9 % (ref 36.0–46.0)
Hemoglobin: 11.4 g/dL — ABNORMAL LOW (ref 12.0–15.0)
MCH: 27.1 pg (ref 26.0–34.0)
MCHC: 30.9 g/dL (ref 30.0–36.0)
MCV: 87.9 fL (ref 80.0–100.0)
Platelets: 133 10*3/uL — ABNORMAL LOW (ref 150–400)
RBC: 4.2 MIL/uL (ref 3.87–5.11)
RDW: 16.4 % — ABNORMAL HIGH (ref 11.5–15.5)
WBC: 6 10*3/uL (ref 4.0–10.5)
nRBC: 0 % (ref 0.0–0.2)

## 2019-09-22 LAB — BASIC METABOLIC PANEL
Anion gap: 10 (ref 5–15)
BUN: 16 mg/dL (ref 8–23)
CO2: 24 mmol/L (ref 22–32)
Calcium: 8.8 mg/dL — ABNORMAL LOW (ref 8.9–10.3)
Chloride: 100 mmol/L (ref 98–111)
Creatinine, Ser: 1.86 mg/dL — ABNORMAL HIGH (ref 0.44–1.00)
GFR calc Af Amer: 33 mL/min — ABNORMAL LOW (ref >60)
GFR calc non Af Amer: 28 mL/min — ABNORMAL LOW (ref >60)
Glucose, Bld: 362 mg/dL — ABNORMAL HIGH (ref 70–99)
Potassium: 5.1 mmol/L (ref 3.5–5.1)
Sodium: 134 mmol/L — ABNORMAL LOW (ref 135–145)

## 2019-09-22 LAB — C-REACTIVE PROTEIN: CRP: <0.8 mg/dL (ref <1.0)

## 2019-09-22 LAB — CK: Total CK: 141 U/L (ref 38–234)

## 2019-09-22 LAB — SEDIMENTATION RATE: Sed Rate: 55 mm/hr — ABNORMAL HIGH (ref 0–22)

## 2019-09-22 MED ORDER — PREGABALIN 150 MG PO CAPS: 150.0000 mg | ORAL_CAPSULE | Freq: Two times a day (BID) | ORAL | 1 refills | Status: DC

## 2019-09-22 NOTE — Progress Notes (Signed)
[name] [name], [name] [name] ([id number]) Visit Report for 09/19/2019 Fall Risk Assessment Details Patient Name: Date of Service: [name] [name], [name] [name] 09/19/2019 3:45 PM Medical Record [id number] Patient Account Number: [id number] Date of Birth/Sex: Treating RN: [birth year] (62 y.o. [name] Primary Care Aspen Deterding: Carollee Herter, Kendrick Fries Other Clinician: Referring Damen Windsor: Treating Timonthy Hovater/Extender:Robson, Enedina Finner Weeks in Treatment: 75 Fall Risk Assessment Items Have you had 2 or more falls in the last 12 monthso 0 Yes Have you had any fall that resulted in injury in the last 12 monthso 0 No FALLS RISK SCREEN History of falling - immediate or within 3 months 25 Yes Secondary diagnosis (Do you have 2 or more medical diagnoseso) 15 Yes Ambulatory aid None/bed rest/wheelchair/nurse 0 Yes Crutches/cane/walker 0 No Furniture 0 No Intravenous therapy Access/Saline/Heparin Lock 0 No Weak (short steps with or without shuffle, stooped but able to lift head 0 No while walking, may seek support from furniture) Impaired (short steps with shuffle, may have difficulty arising from chair, 0 No head down, impaired balance) Mental Status Oriented to own ability 0 Yes Overestimates or forgets limitations 0 No Risk Level: Medium Risk Score: 40 Electronic Signature(s) Signed: 09/22/2019 2:18:43 PM By: [name] RN, BSN Entered By: [name] on 09/19/2019 16:00:00

## 2019-09-22 NOTE — Progress Notes (Addendum)
SCOTTLYN, MCHANEY ([id number]) Visit Report for 09/19/2019 Arrival Information Details Patient Name: Date of Service: [name] [name], [name] [name] 09/19/2019 3:45 PM Medical Record [id number] Patient Account Number: [id number] Date of Birth/Sex: Treating RN: 1956-08-21 (63 y.o. [name] Primary Care Mykle Pascua: Carollee Herter, Kendrick Fries Other Clinician: Referring Zissy Hamlett: Treating Milicent Acheampong/Extender:Robson, Grafton Folk in Treatment: 50 Visit Information History Since Last Visit Added or deleted any medications: No Patient Arrived: Wheel Chair Any new allergies or adverse reactions: No Arrival Time: 15:47 Had a fall or experienced change in Yes Accompanied By: alone activities of daily living that may affect Transfer Assistance: None risk of falls: Patient Identification Verified: Yes Signs or symptoms of abuse/neglect since last No Secondary Verification Process Yes visito Completed: Hospitalized since last visit: No Patient Requires Transmission- No Implantable device outside of the clinic excluding No Based Precautions: cellular tissue based products placed in the center Patient Has Alerts: Yes since last visit: Patient Alerts: R ABI = 1.12, L Has Dressing in Place as Prescribed: Yes ABI = 1.1 Has Compression in Place as Prescribed: Yes Pain Present Now: No Electronic Signature(s) Signed: 09/22/2019 2:18:43 PM By: [name] RN, BSN Entered By: [name] on 09/19/2019 15:59:37 -------------------------------------------------------------------------------- Encounter Discharge Information Details Patient Name: Date of Service: [name] [name] 09/19/2019 3:45 PM Medical Record [id number] Patient Account Number: [id number] Date of Birth/Sex: Treating RN: Aug 20, 1956 (63 y.o. [name] Primary Care Tomoki Lucken: Carollee Herter, Kendrick Fries Other Clinician: Referring Kieon Lawhorn: Treating Eline Geng/Extender:Robson, Grafton Folk in Treatment: 7 Encounter Discharge Information Items Post Procedure Vitals Discharge Condition: Stable Temperature (F): 98.6 Ambulatory Status: Wheelchair Pulse (bpm): 80 Discharge Destination: Home Respiratory Rate (breaths/min): 16 Transportation: Private Auto Blood Pressure (mmHg): 125/47 Accompanied By: self Schedule Follow-up Appointment: Yes Clinical Summary of Care: Electronic Signature(s) Signed: 09/19/2019 6:01:02 PM By: Deon Pilling Entered By: Deon Pilling on 09/19/2019 17:04:34 -------------------------------------------------------------------------------- Lower Extremity Assessment Details Patient Name: Date of Service: [name] [name], [name] [name] 09/19/2019 3:45 PM Medical Record [id number] Patient Account Number: [id number] Date of Birth/Sex: Treating RN: [birth year] (63 y.o. [name] Primary Care Jaidah Lomax: Carollee Herter, Kendrick Fries Other Clinician: Referring Anastassia Noack: Treating Sidney Silberman/Extender:Robson, Tennis Ship, Tora Kindred in Treatment: 44 Edema Assessment Assessed: [Left: No] [Right: No] Edema: [Left: N] [Right: o] Calf Left: Right: Point of Measurement: 28 cm From Medial Instep 35.4 cm cm Ankle Left: Right: Point of Measurement: 10 cm From Medial Instep 21.5 cm cm Vascular Assessment Pulses: Dorsalis Pedis Palpable: [Left:Yes] Electronic Signature(s) Signed: 09/22/2019 2:18:43 PM By: [name] RN, BSN Entered By: [name] on 09/19/2019 15:59:14 -------------------------------------------------------------------------------- Multi Wound Chart Details Patient Name: Date of Service: [name] [name] 09/19/2019 3:45 PM Medical Record [id number] Patient Account Number: [id number] Date of Birth/Sex: Treating RN: Oct 25, 1956 (63 y.o. 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] [6:Lower Extremity] Comorbid History: [3:Cataracts, Anemia, Coronary Artery Disease, Coronary Artery Disease, Hypertension, Myocardial Hypertension, Myocardial Infarction, Type II Diabetes, Infarction, Type II Diabetes, Osteoarthritis, Osteomyelitis, Neuropathy  Osteomyelitis, Neuropathy] [6:Cataracts, Anemia, Osteoarthritis,] [N/A:N/A] Date Acquired: [3:11/12/2016] [6:06/19/2019] [N/A:N/A] Weeks of Treatment: [3:44] [6:13] [N/A:N/A] Wound Status: [3:Open] [6:Open] [N/A:N/A] Measurements  L x W x D 3x5x1 [6:0.3x0.2x0.3] [N/A:N/A] (cm) Area (cm) : [3:11.781] [6:0.047] [N/A:N/A] Volume (cm) : [3:11.781] [6:0.014] [N/A:N/A] % Reduction in Area: [3:-120.00%] [6:-487.50%] [N/A:N/A] % Reduction in Volume: -83.30% [6:-1300.00%] [N/A:N/A] Starting Position 1 6 (o'clock): Ending Position 1 [3:10] (o'clock): Maximum Distance 1 [3:2.5] (cm): Undermining: [3:Yes] [6:No] [N/A:N/A] Classification: [3:Grade 2] [6:Grade 2] [N/A:N/A] Exudate Amount: [3:Medium] [6:Small] [N/A:N/A] Exudate Type: [3:Serosanguineous] [6:Serosanguineous] [N/A:N/A] Exudate Color: [3:red, brown] [6:red, brown] [N/A:N/A] Wound Margin: [3:Flat and Intact] [6:Flat and Intact] [N/A:N/A] Granulation Amount: [3:Large (67-100%)] [6:Large (67-100%)] [N/A:N/A] Granulation Quality: [3:Pink] [6:Red] [N/A:N/A] Necrotic Amount: [3:Small (1-33%)] [6:None Present (0%)] [N/A:N/A] Exposed Structures: [3:Fat Layer (Subcutaneous Fat Layer (Subcutaneous N/A Tissue) Exposed: Yes Fascia: No Tendon: No  Muscle: No Joint: No Bone: No] [6:Tissue) Exposed: Yes Fascia: No Tendon: No Muscle: No Joint: No Bone: No] Epithelialization: [3:Small (1-33%)] [6:Large (67-100%)] [N/A:N/A] Debridement: [3:Debridement - Excisional N/A] [N/A:N/A] Pre-procedure [3:16:38] [6:N/A] [N/A:N/A] Verification/Time Out Taken: Pain Control: [3:Other] [6:N/A N/A] Tissue Debrided: [3:Callus, Subcutaneous, Slough] [6:N/A N/A] Level: [3:Skin/Subcutaneous Tissue] [6:N/A N/A] Debridement Area (sq cm):15 [6:N/A N/A] Instrument: [3:Blade, Curette, Forceps] [6:N/A N/A] Bleeding: [3:Minimum] [6:N/A N/A] Hemostasis Achieved: [3:Pressure] [6:N/A N/A] Procedural Pain: [3:0] [6:N/A N/A] Post Procedural Pain: [3:0] [6:N/A N/A] Debridement Treatment Procedure was tolerated [6:N/A N/A] Response: [3:well] Post Debridement [3:5.5x5x1] [6:N/A N/A] Measurements L x W x D (cm) Post Debridement [3:21.598] [6:N/A N/A] Volume: (cm) Procedures Performed: Debridement [6:N/A N/A] Treatment Notes Wound #3 (Left Calcaneus) 1. Cleanse With Wound Cleanser Soap and water 3. Primary Dressing Applied Calcium Alginate Ag 4. Secondary Dressing ABD Pad Dry Gauze Roll Gauze 5. Secured With Tape 7. Footwear/Offloading device applied Felt/Foam Wedge shoe Notes netting. Wound #6 (Left Toe Great) 1. Cleanse With Wound Cleanser Soap and water 3. Primary Dressing Applied Calcium Alginate Ag 4. Secondary Dressing Dry Gauze Roll Gauze 5. Secured With Medipore tape 7. Footwear/Offloading device applied Felt/Foam Electronic Signature(s) Signed: 09/19/2019 6:05:38 PM By: Linton Ham MD Signed: 09/22/2019 2:17:56 PM By: Kela Millin Entered By: Linton Ham on 09/19/2019 17:34:37 -------------------------------------------------------------------------------- Multi-Disciplinary Care Plan Details Patient Name: Date of Service: [name] [name], [name] [name] 09/19/2019 3:45 PM Medical Record [id number] Patient Account Number:  [id number] Date of Birth/Sex: Treating RN: January 13, 1956 (63 y.o. Clearnce Sorrel Primary Care Wesson Stith: [name] Other Clinician: Referring Reneisha Stilley: Treating Simmone Cape/Extender:Robson, Grafton Folk in Treatment: 82 Active Inactive Abuse / Safety / Falls / Self Care Management Nursing Diagnoses: Potential for injury related to falls Goals: Patient/caregiver will verbalize understanding of skin care regimen Date Initiated: 11/12/2018 Target Resolution Date: 09/26/2019 Goal Status: Active Interventions: Assess Activities of Daily Living upon admission and as needed Assess fall risk on admission and as needed Assess: immobility, friction, shearing, incontinence upon admission and as needed Assess impairment of mobility on admission and as needed per policy Assess personal safety and home safety (as indicated) on admission and as needed Assess self care needs on admission and as needed Notes: Wound/Skin Impairment Nursing Diagnoses: Knowledge deficit related to ulceration/compromised skin integrity Goals: Patient/caregiver will verbalize understanding of skin care regimen Date Initiated: 04/11/2019 Target Resolution Date: 09/26/2019 Goal Status: Active Ulcer/skin breakdown will have a volume reduction of 30% by week 4 Date Initiated: 11/12/2018 Date Inactivated: 01/17/2019 Target Resolution Date: 01/11/2019 Goal Status: Met Ulcer/skin breakdown will have a volume reduction of 50% by week 8 Date Initiated: 01/17/2019 Date Inactivated: 02/14/2019 Target Resolution Date: 02/14/2019 Goal Status: Met Ulcer/skin breakdown will have a volume reduction of 80% by week 12 Date Initiated: 02/14/2019 Date Inactivated:  03/21/2019 Target Resolution Date: 03/14/2019 Unmet Reason: infection in Goal Status: Unmet wound Interventions: Assess patient/caregiver ability to obtain necessary supplies Assess patient/caregiver ability to perform ulcer/skin care regimen upon  admission and as needed Assess ulceration(s) every visit Notes: Electronic Signature(s) Signed: 09/22/2019 2:17:56 PM By: Kela Millin Entered By: Kela Millin on 09/19/2019 16:46:16 -------------------------------------------------------------------------------- Pain Assessment Details Patient Name: Date of Service: [name] [name], [name] [name] 09/19/2019 3:45 PM Medical Record [id number] Patient Account Number: [id number] Date of Birth/Sex: Treating RN: 01-Jan-1956 (63 y.o. [name] Primary Care Renia Mikelson: [name] Other Clinician: Referring Clea Dubach: Treating Fionna Merriott/Extender:Robson, Enedina Finner Weeks in Treatment: 37 Active Problems Location of Pain Severity and Description of Pain Patient Has Paino No Site Locations Pain Management and Medication Current Pain Management: Electronic Signature(s) Signed: 09/22/2019 2:18:43 PM By: [name] RN, BSN Entered By: [name] on 09/19/2019 15:49:26 -------------------------------------------------------------------------------- Patient/Caregiver Education Details Patient Name: [name] [name] 11/20/2020andnbsp3:45 Date of Service: PM Medical Record [id number] Number: Patient Account Number: [id number] Date of Birth/Gender: 04-04-1956 (63 y.o. F) Treating RN: Kela Millin Other Clinician: Primary Care Physician: Carollee Herter, Antionette Poles Physician/Extender: Carollee Herter, Referring Physician: Tora Kindred in Treatment: 75 Education Assessment Education Provided To: Patient Education Topics Provided Wound/Skin Impairment: Methods: Explain/Verbal Responses: State content correctly Electronic Signature(s) Signed: 09/22/2019 2:17:56 PM By: Kela Millin Entered By: Kela Millin on 09/19/2019 16:46:29 -------------------------------------------------------------------------------- Wound Assessment Details Patient Name: Date of Service: [name] [name], [name] [name] 09/19/2019 3:45 PM Medical Record [id number] Patient Account Number: [id number] Date of Birth/Sex: Treating RN: September 09, 1956 (63 y.o. [name] Primary Care Imre Vecchione: Carollee Herter, Kendrick Fries Other Clinician: Referring Keagan Anthis: Treating Mariko Nowakowski/Extender:Robson, Tennis Ship, Tora Kindred in Treatment: 44 Wound Status Wound Number: 3 Primary Diabetic Wound/Ulcer of the Lower Extremity Etiology: Wound Location: Left Calcaneus Wound Open Wounding Event: Trauma Status: Date Acquired: 11/12/2016 Comorbid Cataracts, Anemia, Coronary Artery Disease, Weeks Of Treatment: 44 History: Hypertension, Myocardial Infarction, Type II Clustered Wound: No Diabetes, Osteoarthritis, Osteomyelitis, Neuropathy Photos Wound Measurements Length: (cm) 3 % Reduction in Area Width: (cm) 5 % Reduction in Volu Depth: (cm) 1 Epithelialization: Area: (cm) 11.781 Tunneling: Volume: (cm) 11.781 Undermining: Starting Positio Ending Position Maximum Distance : -120% me: -83.3% Small (1-33%) No Yes n (o'clock): 6 (o'clock): 10 : (cm) 2.5 Wound Description Classification: Grade 2 Foul Odor After Cle Wound Margin: Flat and Intact Slough/Fibrino Exudate Amount: Medium Exudate Type: Serosanguineous Exudate Color: red, brown Wound Bed Granulation Amount: Large (67-100%) Granulation Quality: Pink Fascia Exposed: Necrotic Amount: Small (1-33%) Fat Layer (Subcutan Necrotic Quality: Adherent Slough Tendon Exposed: Muscle Exposed: Joint Exposed: Bone Exposed: ansing: No Yes Exposed Structure No eous Tissue) Exposed: Yes No No No No Treatment Notes Wound #3 (Left Calcaneus) 1. Cleanse With Wound Cleanser Soap and water 3. Primary Dressing Applied Calcium Alginate Ag 4. Secondary Dressing ABD Pad Dry Gauze Roll Gauze 5. Secured With Tape 7. Footwear/Offloading device applied Felt/Foam Wedge shoe Notes netting. Electronic Signature(s) Signed:  09/29/2019 4:19:43 PM By: Mikeal Hawthorne EMT/HBOT Signed: 10/08/2019 12:09:06 PM By: [name] RN, BSN Previous Signature: 09/22/2019 2:18:43 PM Version By: [name] RN, BSN Entered By: Mikeal Hawthorne on 09/29/2019 10:43:36 -------------------------------------------------------------------------------- Wound Assessment Details Patient Name: Date of Service: [name] [name], [name] [name] 09/19/2019 3:45 PM Medical Record [id number] Patient Account Number: [id number] Date of Birth/Sex: Treating RN: [birth year] (63 y.o. [name] Primary Care Saleem Coccia: [name] Other Clinician: Referring Jenine Krisher: Treating [place] Millon/Extender:Robson, Lennie Odor CHASE, YVONNE Weeks in Treatment: 73 Wound Status Wound Number: 6 Primary  Diabetic Wound/Ulcer of the Lower Extremity Etiology: Wound Location: Left Toe Great Wound Open Wounding Event: Blister Status: Date Acquired: 06/19/2019 Comorbid Cataracts, Anemia, Coronary Artery Disease, Weeks Of Treatment: 13 History: Hypertension, Myocardial Infarction, Type II Clustered Wound: No Diabetes, Osteoarthritis, Osteomyelitis, Neuropathy Photos Wound Measurements Length: (cm) 0.3 Width: (cm) 0.2 Depth: (cm) 0.3 Area: (cm) 0.047 Volume: (cm) 0.014 Wound Description Classification: Grade 2 Wound Margin: Flat and Intact Exudate Amount: Small Exudate Type: Serosanguineous Exudate Color: red, brown Wound Bed Granulation Amount: Large (67-100%) Granulation Quality: Red Necrotic Amount: None Present (0%) After Cleansing: No brino No Exposed Structure osed: No (Subcutaneous Tissue) Exposed: Yes osed: No osed: No sed: No ed: No % Reduction in Area: -487.5% % Reduction in Volume: -1300% Epithelialization: Large (67-100%) Tunneling: No Undermining: No Foul Odor Slough/Fi Fascia Exp Fat Layer Tendon Exp Muscle Exp Joint Expo Bone Expos Treatment Notes Wound #6 (Left Toe Great) 1. Cleanse With Wound  Cleanser Soap and water 3. Primary Dressing Applied Calcium Alginate Ag 4. Secondary Dressing Dry Gauze Roll Gauze 5. Secured With Medipore tape 7. Footwear/Offloading device applied Felt/Foam Electronic Signature(s) Signed: 09/29/2019 4:19:43 PM By: Mikeal Hawthorne EMT/HBOT Signed: 10/08/2019 12:09:06 PM By: [name] RN, BSN Previous Signature: 09/22/2019 2:18:43 PM Version By: [name] RN, BSN Entered By: Mikeal Hawthorne on 09/29/2019 10:43:08 -------------------------------------------------------------------------------- Blair Details Patient Name: Date of Service: [name] [name], [name] [name] 09/19/2019 3:45 PM Medical Record [id number] Patient Account Number: [id number] Date of Birth/Sex: Treating RN: 1956-02-20 (63 y.o. [name] Primary Care Dinh Ayotte: Carollee Herter, Kendrick Fries Other Clinician: Referring Jocilyn Trego: Treating Norton Bivins/Extender:Robson, Tennis Ship, Tora Kindred in Treatment: 38 Vital Signs Time Taken: 15:48 Temperature (F): 98.6 Height (in): 62 Pulse (bpm): 80 Weight (lbs): 180 Respiratory Rate (breaths/min): 16 Body Mass Index (BMI): 32.9 Blood Pressure (mmHg): 125/47 Reference Range: 80 - 120 mg / dl Electronic Signature(s) Signed: 09/22/2019 2:18:43 PM By: [name] RN, BSN Entered By: [name] on 09/19/2019 15:49:21

## 2019-09-22 NOTE — Progress Notes (Signed)
RAEDYN, KLINCK ([id number]) Visit Report for 09/19/2019 Debridement Details Patient Name: Date of Service: RIDDHI, GRETHER 09/19/2019 3:45 PM Medical Record [id number] Patient Account Number: [id number] Date of Birth/Sex: Treating RN: Jun 23, 1956 (63 y.o. Clearnce Sorrel Primary Care Provider: Carollee Herter, Kendrick Fries Other Clinician: Referring Provider: Carollee Herter, Kendrick Fries Treating Provider/Extender:Robson, Esperanza Richters in Treatment: 44 Debridement Performed for Wound #3 Left Calcaneus Assessment: Performed By: Physician Ricard Dillon., MD Debridement Type: Debridement Severity of Tissue Pre Fat layer exposed Debridement: Level of Consciousness (Pre- Awake and Alert procedure): Pre-procedure Verification/Time Out Taken: Yes - 16:38 Start Time: 16:38 Pain Control: Other : Benzocaine 20% Total Area Debrided (L x W): 3 (cm) x 5 (cm) = 15 (cm) Tissue and other material Viable, Non-Viable, Callus, Slough, Subcutaneous, Slough debrided: Level: Skin/Subcutaneous Tissue Debridement Description: Excisional Instrument: Blade, Curette, Forceps Bleeding: Minimum Hemostasis Achieved: Pressure End Time: 16:39 Procedural Pain: 0 Post Procedural Pain: 0 Response to Treatment: Procedure was tolerated well Level of Consciousness Awake and Alert (Post-procedure): Post Debridement Measurements of Total Wound Length: (cm) 5.5 Width: (cm) 5 Depth: (cm) 1 Volume: (cm) 21.598 Character of Wound/Ulcer Post Improved Debridement: Severity of Tissue Post Debridement: Fat layer exposed Post Procedure Diagnosis Same as Pre-procedure Electronic Signature(s) Signed: 09/19/2019 6:05:38 PM By: Linton Ham MD Signed: 09/22/2019 2:17:56 PM By: Kela Millin Entered By: Linton Ham on 09/19/2019 17:34:46 -------------------------------------------------------------------------------- HPI Details Patient Name: Date of Service: [name] 09/19/2019 3:45  PM Medical Record [id number] Patient Account Number: [id number] Date of Birth/Sex: Treating RN: [birth year] (63 y.o. Clearnce Sorrel Primary Care Provider: [name] Other Clinician: Referring Provider: Treating Provider/Extender:Robson, Enedina Finner Weeks in Treatment: 8 History of Present Illness HPI Description: ADMISSION 11/12/2018 This is a 63 year old woman with type 2 diabetes and diabetic neuropathy. She has been dealing with a left heel plantar wound for roughly 2 years. She states this started when she pulled some skin off the area and it progressed into a wound. She also has an area on the tip of her left great toe for 1 year. She has been largely followed by Dr. Sharol Given and she had an excision of bone in the left heel in 2017. I do not see microbiology from this excision or pathology. Apparently this wound never really closed. She most recently has been using Silvadene cream. Offloading this with a scooter. The last MRI I see was in June 2018 which did not show osteomyelitis at that time. Apparently this wound is never really progressed towards healing. During her last review by Dr. Sharol Given in October it was recommended that she undergo a left BKA and she refused. She went to see a second orthopedic consult at [facility] who recommended conservative wound care to see if this will close or progress towards closure but also warned that possible surgery may be necessary. An x-ray that was done at [facility] showed an irregular calcaneal body and calcaneal tuberosity. This is probably postprocedural. Previous x-rays at [facility] had suggested heterotrophic calcifications but I do not see this. Apparently a second ointment was added to the Silvadene which the patient thinks is because some improvement. She has not been systemically unwell. No fever or chills. She thinks the second ointment that was given to her at [facility] has helped somewhat. 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 ABIs done in June 2018 showed a ABI of 1.12 on the right and 1.1 on the left she was biphasic bilaterally. ABIs in our clinic were noncompressible today. 11/20/18 on evaluation today patient is seen for her second visit here in the office although this is actually the first visit with me concerning an issue that she's having with her great toe and heel of the left foot. Fortunately she does not appear to be have any discomfort at this time and she does have a scooter in order to offload her foot she also has a boot for offloading. With that being said this is something that appears to have been going on for some time she was seeing Dr. due to his recommendation was that she was going to require a below knee amputation. With that being said she wanted to come to the wound center but according to the patient Dr. Sharol Given told her that "we could not help her". Nonetheless she saw another provider who suggested that it may be worth a shot for [name] to try and help her out if it all possible. Nonetheless she decided that it would be worth a trial since otherwise any the way she's gonna end up with an amputation. Obviously if we can heal the wound then that will not be the case. Again I explained to her that obviously there are no guarantees but we will give this a good try and attempts to get the wound to heal. 1/30; the patient continues to have areas on the left plantar heel and the left plantar great toe. The more worrisome area is the heel. She went for MRI on Saturday but this could not be done because she had silver alginate in the wound bed. I would like to get this rebooked. If  she does not have osteomyelitis she will need a total contact cast. We have been using silver alginate in the wounds 2/7; patient has wounds on her left plantar heel and left plantar great toe. The area on the heel has some depth although it looks about the same today. Her MRI will finally be done tomorrow. If she has osteomyelitis in the heel and then we will need to consider her for IV antibiotics and hyperbaric oxygen. If the MRI is negative she will need a total contact cast although she is wearing a cam boot and using a scooter at present. Will be using silver alginate. She will take it off tomorrow in preparation for the MRI 2/14; the patient's MRI showed extensive surgical changes with a large portion of her calcaneus removed on the left secondary to her previous surgery by Dr. Sharol Given however there is no evidence of osteomyelitis. She would therefore be a candidate for a total contact cast and we applied this for the first time today 2/21; silver collagen total contact cast. The area on the plantar left great toe is "healed" still a lot of callus on this area. Dimensions on the plantar heel not too much different some epithelialization is present however. This is an improvement 12/25/18 on evaluation today patient actually is seen for follow-up concerning issues that she has been having with her cast she states she feels like it's wet and squishy in the bottom of her cast. The reason she does come in to have this evaluated and see what is going on. Fortunately there's no signs  of infection at this time was the cast was removed. 3/6; the patient's area on the tip of her right great toe is callused but there is no open area here. She still has the fairly extensive area in the left heel. We have been using silver collagen in the wound. 01/08/19 on evaluation today patient actually appears to be doing very well in regard to her heel ulcer in my pinion to see if you shown signs of improvement which  is excellent news. There's no evidence of infection. She continues to have quite a bit of drainage but fortunately again this doesn't seem to be hindering her healing. 3/20 -Patient's foot ulcer on the left appears to be doing well, the dimensions appear encouraging, we have been using total contact cast with Prisma and will continue doing that 3/27; patient continues to make nice progress on the left plantar heel. She is using silver collagen under a total contact cast. It is been a while since I have seen this wound and it really looks a lot better. Smaller with healthy granulation 4/3; she continues to make nice progress on the left plantar heel. Using silver collagen under a total contact cast 4/10; left plantar heel. Again skin over the surface of the wound with not much in the way of adherence. This results in undermining. Using silver collagen changed to silver alginate 4/17 left plantar heel. Again not much improvement. There is no undermining today no debridement was required we used silver alginate last time because of excessive moisture 4/24 left plantar heel. Again not as much improvement as I would have liked. About 3 mm of depth. Thick callused skin around the circumference. Using silver alginate 5/1; left plantar heel this is improved this week. Less depth epithelialization is present. Using silver alginate alginate under a total contact cast 5/8; left plantar heel dimensions are about the same. The depth appears to be improved. I use silver collagen starting today under the cast 5/15 left plantar heel. Arrives today with a 2-day history of feeling like something had "slipped" while walking her dog in the cast. Unfortunately extensive area relatively to the small wound of undermining superiorly denuded epithelium. 5/22-Patient returns at 1 week after being taken off the [facility] on account of some fluid collection that was debrided and cultured at last visit from the plantar ulcer on  the left heel. The culture results are polymicrobial with Klebsiella, enterococcus, Proteus growth these organisms are sensitive to Cipro except with enterococcus which is sensitive to ampicillin but patient is highly allergic to penicillin according to her. 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 5/29; the patient arrived last week with a new wound on her left plantar great toe. With regards to the culture that I did 2 weeks ago of her deteriorating heel wound this grew Klebsiella and enterococcus. She was given Cipro however the enterococcus would not be covered well by a quinolone. She is allergic to penicillin. I will give her linezolid 600 twice daily x5 days 6/12; the patient continues to have a wound on her left and after she returns to the beach there is undermining laterally. She has the new wound from this 2 weeks or so ago on the plantar tip of her left great toe. She had a fall today scraping the dorsal surface of the left fifth 7/14; READMISSION since the patient was last here she was hospitalized from 04/15/2019  through 04/22/2019. She had presented to an outside ER after falling and hitting her elbow. She had had previous surgery on the elbow and fractures several years ago. An x-ray was negative and she was sent home. She is readmitted with sepsis and acute renal failure secondary to septic arthritis of the elbow. Cultures apparently showed pansensitive staph aureus however she has a severe beta-lactam allergy. She was treated with vancomycin. Apparently the vancomycin is completed and her PICC line is removed although she is seeing Dr. Megan Salon tomorrow due to continued pain and swelling. In the hospital she had an IandD by Dr. [name] of orthopedics. The original surgery was on 04/17/2019 and it was felt that she had septic arthritis. She is still having a lot of pain and swelling in the right  elbow and apparently is due to see Dr. Megan Salon tomorrow and what she thinks is that she will be restarted on antibiotics. With regards to her heel wounds/left foot wounds. Her arterial studies were checked and her ABIs were within normal limits. X-ray showed plantar foot ulcer negative for osteomyelitis postop resection of the posterior calcaneus. She has been using silver alginate to the heel 8/4-Patient returns after being seen on 7/14, we are using silver alginate to the calcaneal wound, she is continuing to receive care for her right elbow septic arthritis 8/14- Patient returns after 1 week, she is being seen by the surgical group for her right elbow, she is here for the left calcaneal wound for which we are using silver alginate this is about the same 8/21; this is a patient I readmitted to the clinic 5 weeks ago. She is continuing to have difficulties with a septic arthritis of the right elbow she had after a fall and apparently has had surgical IandD's since the last time we saw her. She is also following with Dr. Megan Salon of infectious disease and is apparently on 3 oral antibiotics although at the time of this dictation I am not sure what they are. She comes in today with a necrotic surface on the left great toe with a blister laterally. This was still clearly an open wound. She had purulent drainage coming out of this. The original wound on the plantar aspect of her right heel in the setting of a Charcot foot is deep not open to bone but certainly not any better at all. We have been using silver alginate 8/28; dealing with septic arthritis of the right elbow. May need to go for further surgery here in the second week of September. She had a blister on the left great toe that was purulent [place] draining last week although a culture did not grow anything [already on antibiotics through infectious disease for her elbow]. The punched-out area on her heel is just like it was when she first  came in. This almost closed with a total contact cast there are no options for that now. The patient states she cannot stay off her foot having to do housework X-ray of the foot showed a moderate bunion and severe degenerative changes at the first metatarsal phalangeal joint there was a moderate plantar calcaneal spur. We will need to check the location of this. 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 It is oral. We are using silver collagen to the punched-out area on her left heel and to  the summer superficial area of the left great toe. She is offloading this is best she can although judging by the amount of callus it is not enough. She thinks she has enough strength in her right elbow now to use her scooter. 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 Hale Bogus notes and infectious disease. We are using silver collagen to the punched-out areas on her left heel and to the plantar aspect of her left great toe she is now using her scooter to get around and to help offload these areas 10/2; 2-week follow-up. Still on clindamycin I believe for the left elbow she is going for an MRI of the elbow over the weekend. She is then going to see orthopedics and infectious disease. The area on the left heel is certainly no better. This does not probe to bone however there is green drainage. She has an area on the tip of her toe. The area on the heel is in a wound we almost closed at one point with total contact casting. 10/9; one-week follow-up. Culture I did of the heel last week which was a swab culture admittedly showed moderate Enterococcus faecalis moderate Pseudomonas. The wound  itself looks about the same. There is no palpable bone although the depth of it closely approximates bone. The heel is swollen but not erythematous. Her MRI is booked for next week. She also has an MRI of the right elbow. I've also lifted the last consult from Dr. Megan Salon who is following her for septic arthritis of the right elbow. I did not see him specifically comment on her left heel however the patient states that he is aware of this. I did not specifically address the organisms I cultured last week because of the possible effect of any additional cultures that will be done on the elbow. Additionally these were superficial not bone cultures 10/16; her MRI of the heel was put back to next week. She did have her elbow done. I have been in contact with Dr. Megan Salon about my concerns about osteomyelitis of the left heel. We have been using collagen. She also has a wound on the plantar tip of her first toe 10/23; her MRI of the heel was negative for osteomyelitis but suggested a small abscess. She also had an MRI of her elbow that showed findings compatible with a septic joint. She went to see Dr. Megan Salon and she is going to have both oral and IV antibiotics which I am sure will cover any infection in the heel. I did not actually see his note today. We are using silver collagen offloading the heel with a heel offloading boot 11/6; patient continues with silver collagen to both wound areas on her plantar left heel and plantar left great toe. She remains on daptomycin by Dr. Megan Salon of infectious disease. She complains of diarrhea. Dr. Megan Salon is aware of this. 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 she has been put on oral vancomycin I think a lot of high clinical suspicion for pseudomembranous colitis Because of the right elbow there are no options to  completely off her left foot beyond the heel offloading boot we have now. Fortunately  the left heel ulcer itself has remained static some improvement in the plantar left great toe 11/20; patient arrives for review of her left heel ulcer. I also note she has a difficult time with septic arthritis of the right elbow. She currently is on IV daptomycin which seems to have helped the drainage in her elbow [MSSA]. Because of high concern for pseudomembranous colitis she was also put on vancomycin orally and Flagyl orally. She was on Levaquin but that was discontinued because of the concern about C. difficile. She states her diarrhea is better. She arrives in clinic today with a large area of denuded skin on the lateral part of the heel. This had totally separated. We have been using silver alginate to the wound areas. She arrived in a scooter telling me she is offloading the foot is much as possible. I think there is probably chronic infection here although her recent MRI did not show osteomyelitis Electronic Signature(s) Signed: 09/19/2019 6:05:38 PM By: Linton Ham MD Entered By: Linton Ham on 09/19/2019 17:37:32 -------------------------------------------------------------------------------- Physical Exam Details Patient Name: Date of Service: TIESHIA, RETTINGER 09/19/2019 3:45 PM Medical Record [id number] Patient Account Number: [id number] Date of Birth/Sex: Treating RN: 04-25-1956 (62 y.o. Clearnce Sorrel Primary Care Provider: [name] Other Clinician: Referring Provider: Treating Provider/Extender:Robson, Tennis Ship, Tora Kindred in Treatment: 64 Constitutional Sitting or standing Blood Pressure is within target range for patient.. Pulse regular and within target range for patient.[name] Kitchen Respirations regular, non-labored and within target range.. Temperature is normal and within the target range for the patient.[name] Kitchen Appears in no distress. Notes Wound  exam; punched open wound in the plantar heel. This is clearly down to the muscular layer of the heel at the bottom. The callus around this is separated along with some underlying subcutaneous tissue. All of this removed with a #15 scalpel and pickups. Underlying subcutaneous tissue also debrided to get some sort of surface. Electronic Signature(s) Signed: 09/19/2019 6:05:38 PM By: Linton Ham MD Entered By: Linton Ham on 09/19/2019 17:38:48 -------------------------------------------------------------------------------- Physician Orders Details Patient Name: Date of Service: FIDELA, CIESLAK 09/19/2019 3:45 PM Medical Record [id number] Patient Account Number: [id number] Date of Birth/Sex: Treating RN: [birth year] (63 y.o. 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 Dressing Change Frequency Wound #3 Left Calcaneus Change Dressing every other day. Wound #6 Left Toe Great Change Dressing every other day. Wound Cleansing Wound #3 Left Calcaneus May shower and wash wound with soap and water. Wound #6 Left Toe Great May shower and wash wound with soap and water. Primary Wound Dressing Wound #3 Left Calcaneus Calcium Alginate with Silver Wound #6 Left Toe Great Calcium Alginate with Silver Secondary Dressing Wound #3 Left Calcaneus Kerlix/Rolled Gauze Dry Gauze Other: - felt callous pad Wound #6 Left Toe Great Kerlix/Rolled Gauze Dry Gauze Other: - felt callous pad or foam  donut Edema Control Elevate legs to the level of the heart or above for 30 minutes daily and/or when sitting, a frequency of: - throughout the day Off-Loading Other: - use knee scooter or motorized chair , minimal weight bearing left foot Electronic  Signature(s) Signed: 09/19/2019 6:05:38 PM By: Linton Ham MD Signed: 09/22/2019 2:17:56 PM By: Kela Millin Entered By: Kela Millin on 09/19/2019 16:45:41 -------------------------------------------------------------------------------- Problem List Details Patient Name: Date of Service: FALISA, LAMORA 09/19/2019 3:45 PM Medical Record [id number] Patient Account Number: [id number] Date of Birth/Sex: Treating RN: 08-25-1956 (63 y.o. Clearnce Sorrel Primary Care Provider: Carollee Herter, Kendrick Fries Other Clinician: Referring Provider: Treating Provider/Extender:Robson, Enedina Finner Weeks in Treatment: 71 Active Problems ICD-10 Evaluated Encounter Code Description Active Date Today Diagnosis L97.423 Non-pressure chronic ulcer of left heel and midfoot 11/12/2018 No Yes with necrosis of muscle L97.522 Non-pressure chronic ulcer of other part of left foot 11/12/2018 No Yes with fat layer exposed E11.621 Type 2 diabetes mellitus with foot ulcer 11/12/2018 No Yes Inactive Problems Resolved Problems Electronic Signature(s) Signed: 09/19/2019 6:05:38 PM By: Linton Ham MD Entered By: Linton Ham on 09/19/2019 17:34:30 -------------------------------------------------------------------------------- Progress Note Details Patient Name: Date of Service: [name] 09/19/2019 3:45 PM Medical Record [id number] Patient Account Number: [id number] Date of Birth/Sex: Treating RN: [birth year] (63 y.o. Clearnce Sorrel Primary Care Provider: [name] Other Clinician: Referring Provider: Treating Provider/Extender:Robson, Enedina Finner Weeks in Treatment:  37 Subjective History of Present Illness (HPI) ADMISSION 11/12/2018 This is a 63 year old woman with type 2 diabetes and diabetic neuropathy. She has been dealing with a left heel plantar wound for roughly 2 years. She states this started when she pulled some skin off the area and it progressed into a wound. She also has an area on the tip of her left great toe for 1 year. She has been largely followed by Dr. Sharol Given and she had an excision of bone in the left heel in 2017. I do not see microbiology from this excision or pathology. Apparently this wound never really closed. She most recently has been using Silvadene cream. Offloading this with a scooter. The last MRI I see was in June 2018 which did not show osteomyelitis at that time. Apparently this wound is never really progressed towards healing. During her last review by Dr. Sharol Given in October it was recommended that she undergo a left BKA and she refused. She went to see a second orthopedic consult at [facility] who recommended conservative wound care to see if this will close or progress towards closure but also warned that possible surgery may be necessary. An x-ray that was done at [facility] showed an irregular calcaneal body and calcaneal tuberosity. This is probably postprocedural. Previous x-rays at [facility] had suggested heterotrophic calcifications but I do not see this. Apparently a second ointment was added to the Silvadene which the patient thinks is because some improvement. She has not been systemically unwell. No fever or chills. She thinks the second ointment that was given to her at [facility] has helped somewhat. 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 ABIs done in June 2018 showed a ABI of 1.12 on the right and 1.1 on the left she was biphasic bilaterally. ABIs in our clinic were noncompressible today. 11/20/18 on evaluation today patient is seen for her second visit here in the office although this is actually the first visit with me concerning an issue that she's having with  her great toe and heel of the left foot. Fortunately she does not appear to be have any discomfort at this time and she does have a scooter in order to offload her foot she also has a boot for offloading. With that being said this is something that appears to have been going on for some time she was seeing Dr. due to his recommendation was that she was going to require a below knee amputation. With that being said she wanted to come to the wound center but according to the patient Dr. Sharol Given told her that "we could not help her". Nonetheless she saw another provider who suggested that it may be worth a shot for [name] to try and help her out if it all possible. Nonetheless she decided that it would be worth a trial since otherwise any the way she's gonna end up with an amputation. Obviously if we can heal the wound then that will not be the case. Again I explained to her that obviously there are no guarantees but we will give this a good try and attempts to get the wound to heal. 1/30; the patient continues to have areas on the left plantar heel and the left plantar great toe. The more worrisome area is the heel. She went for MRI on Saturday but this could not be done because she had silver alginate in the wound bed. I would like to get this rebooked. If she does not have osteomyelitis she will need a total contact cast. We have been using silver alginate in the wounds 2/7; patient has wounds on her left plantar heel and left plantar great toe. The area on the heel has some depth although it looks about the same today. Her MRI will finally be done tomorrow. If  she has osteomyelitis in the heel and then we will need to consider her for IV antibiotics and hyperbaric oxygen. If the MRI is negative she will need a total contact cast although she is wearing a cam boot and using a scooter at present. Will be using silver alginate. She will take it off tomorrow in preparation for the MRI 2/14; the patient's MRI showed extensive surgical changes with a large portion of her calcaneus removed on the left secondary to her previous surgery by Dr. Sharol Given however there is no evidence of osteomyelitis. She would therefore be a candidate for a total contact cast and we applied this for the first time today 2/21; silver collagen total contact cast. The area on the plantar left great toe is "healed" still a lot of callus on this area. Dimensions on the plantar heel not too much different some epithelialization is present however. This is an improvement 12/25/18 on evaluation today patient actually is seen for follow-up concerning issues that she has been having with her cast she states she feels like it's wet and squishy in the bottom of her cast. The reason she does come in to have this evaluated and see what is going on. Fortunately there's no signs of infection at this time was the cast was removed. 3/6; the patient's area on the tip of her right great toe is callused but there is no open area here. She still has the fairly extensive area in the left heel. We have been using silver collagen in the wound. 01/08/19 on evaluation today patient actually appears to be doing very well in regard to her heel ulcer in my pinion to see if you shown signs of improvement which is  excellent news. There's no evidence of infection. She continues to have quite a bit of drainage but fortunately again this doesn't seem to be hindering her healing. 3/20 -Patient's foot ulcer on the left appears to be doing well, the dimensions appear encouraging, we have been using total contact cast with  Prisma and will continue doing that 3/27; patient continues to make nice progress on the left plantar heel. She is using silver collagen under a total contact cast. It is been a while since I have seen this wound and it really looks a lot better. Smaller with healthy granulation 4/3; she continues to make nice progress on the left plantar heel. Using silver collagen under a total contact cast 4/10; left plantar heel. Again skin over the surface of the wound with not much in the way of adherence. This results in undermining. Using silver collagen changed to silver alginate 4/17 left plantar heel. Again not much improvement. There is no undermining today no debridement was required we used silver alginate last time because of excessive moisture 4/24 left plantar heel. Again not as much improvement as I would have liked. About 3 mm of depth. Thick callused skin around the circumference. Using silver alginate 5/1; left plantar heel this is improved this week. Less depth epithelialization is present. Using silver alginate alginate under a total contact cast 5/8; left plantar heel dimensions are about the same. The depth appears to be improved. I use silver collagen starting today under the cast 5/15 left plantar heel. Arrives today with a 2-day history of feeling like something had "slipped" while walking her dog in the cast. Unfortunately extensive area relatively to the small wound of undermining superiorly denuded epithelium. 5/22-Patient returns at 1 week after being taken off the [facility] on account of some fluid collection that was debrided and cultured at last visit from the plantar ulcer on the left heel. The culture results are polymicrobial with Klebsiella, enterococcus, Proteus growth these organisms are sensitive to Cipro except with enterococcus which is sensitive to ampicillin but patient is highly allergic to penicillin according to her. This wound appears larger and there is a new small  skin depth wound on the great toe plantar aspect patient does have hammertoes. 5/29; the patient arrived last week with a new wound on her left plantar great toe. With regards to the culture that I did 2 weeks ago of her deteriorating heel wound this grew Klebsiella and enterococcus. She was given Cipro however the enterococcus would not be covered well by a quinolone. She is allergic to penicillin. I will give her linezolid 600 twice daily x5 days 6/12; the patient continues to have a wound on her left and after she returns to the beach there is undermining laterally. She has the new wound from this 2 weeks or so ago on the plantar tip of her left great toe. She had a fall today scraping the dorsal surface of the left fifth 7/14; READMISSION since the patient was last here she was hospitalized from 04/15/2019 through 04/22/2019. She had presented to an outside ER after falling and hitting her elbow. She had had previous surgery on the elbow and fractures several years ago. An x-ray was negative and she was sent home. She is readmitted with sepsis and acute renal failure secondary to septic arthritis of the elbow. Cultures apparently showed pansensitive staph aureus however she has a severe beta-lactam allergy. She was treated with vancomycin. Apparently the vancomycin is completed and her PICC line is removed  although she is seeing Dr. Megan Salon tomorrow due to continued pain and swelling. In the hospital she had an IandD by Dr. [name] of orthopedics. The original surgery was on 04/17/2019 and it was felt that she had septic arthritis. She is still having a lot of pain and swelling in the right elbow and apparently is due to see Dr. Megan Salon tomorrow and what she thinks is that she will be restarted on antibiotics. With regards to her heel wounds/left foot wounds. Her arterial studies were checked and her ABIs were within normal limits. X-ray showed plantar foot ulcer negative for osteomyelitis  postop resection of the posterior calcaneus. She has been using silver alginate to the heel 8/4-Patient returns after being seen on 7/14, we are using silver alginate to the calcaneal wound, she is continuing to receive care for her right elbow septic arthritis 8/14- Patient returns after 1 week, she is being seen by the surgical group for her right elbow, she is here for the left calcaneal wound for which we are using silver alginate this is about the same 8/21; this is a patient I readmitted to the clinic 5 weeks ago. She is continuing to have difficulties with a septic arthritis of the right elbow she had after a fall and apparently has had surgical IandD's since the last time we saw her. She is also following with Dr. Megan Salon of infectious disease and is apparently on 3 oral antibiotics although at the time of this dictation I am not sure what they are. She comes in today with a necrotic surface on the left great toe with a blister laterally. This was still clearly an open wound. She had purulent drainage coming out of this. The original wound on the plantar aspect of her right heel in the setting of a Charcot foot is deep not open to bone but certainly not any better at all. We have been using silver alginate 8/28; dealing with septic arthritis of the right elbow. May need to go for further surgery here in the second week of September. She had a blister on the left great toe that was purulent [place] draining last week although a culture did not grow anything [already on antibiotics through infectious disease for her elbow]. The punched-out area on her heel is just like it was when she first came in. This almost closed with a total contact cast there are no options for that now. The patient states she cannot stay off her foot having to do housework X-ray of the foot showed a moderate bunion and severe degenerative changes at the first metatarsal phalangeal joint there was a moderate plantar  calcaneal spur. We will need to check the location of this. 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 It is oral. We are using silver collagen to the punched-out area on her left heel and to the summer superficial area of the left great toe. She is offloading this is best she can although judging by the amount of callus it is not enough. She thinks she has enough strength in her right elbow now to use her scooter. There is not an option for a cast 9/18; still dealing with septic arthritis of the elbow she is apparently going for an MRI and a possible procedure next week she is on clindamycin and I have reviewed this  with Dr. Hale Bogus notes and infectious disease. We are using silver collagen to the punched-out areas on her left heel and to the plantar aspect of her left great toe she is now using her scooter to get around and to help offload these areas 10/2; 2-week follow-up. Still on clindamycin I believe for the left elbow she is going for an MRI of the elbow over the weekend. She is then going to see orthopedics and infectious disease. The area on the left heel is certainly no better. This does not probe to bone however there is green drainage. She has an area on the tip of her toe. The area on the heel is in a wound we almost closed at one point with total contact casting. 10/9; one-week follow-up. Culture I did of the heel last week which was a swab culture admittedly showed moderate Enterococcus faecalis moderate Pseudomonas. The wound itself looks about the same. There is no palpable bone although the depth of it closely approximates bone. The heel is swollen but not erythematous. Her MRI is booked for next week. She also has an MRI of the right elbow. I've also lifted the last consult from Dr. Megan Salon who is following her for septic  arthritis of the right elbow. I did not see him specifically comment on her left heel however the patient states that he is aware of this. I did not specifically address the organisms I cultured last week because of the possible effect of any additional cultures that will be done on the elbow. Additionally these were superficial not bone cultures 10/16; her MRI of the heel was put back to next week. She did have her elbow done. I have been in contact with Dr. Megan Salon about my concerns about osteomyelitis of the left heel. We have been using collagen. She also has a wound on the plantar tip of her first toe 10/23; her MRI of the heel was negative for osteomyelitis but suggested a small abscess. She also had an MRI of her elbow that showed findings compatible with a septic joint. She went to see Dr. Megan Salon and she is going to have both oral and IV antibiotics which I am sure will cover any infection in the heel. I did not actually see his note today. We are using silver collagen offloading the heel with a heel offloading boot 11/6; patient continues with silver collagen to both wound areas on her plantar left heel and plantar left great toe. She remains on daptomycin by Dr. Megan Salon of infectious disease. She complains of diarrhea. Dr. Megan Salon is aware of this. 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 she has been put on oral vancomycin I think a lot of high clinical suspicion for pseudomembranous colitis Because of the right elbow there are no options to completely off her left foot beyond the heel offloading boot we have now. Fortunately the left heel ulcer itself has remained static some improvement in the plantar left great toe 11/20; patient arrives for review of her left heel ulcer. I also note she has a difficult time with septic arthritis of  the right elbow. She currently is on IV daptomycin which seems to have helped the drainage in her elbow [MSSA]. Because of high concern for pseudomembranous colitis she was also put on vancomycin orally and Flagyl orally. She was on Levaquin but that was  discontinued because of the concern about C. difficile. She states her diarrhea is better. She arrives in clinic today with a large area of denuded skin on the lateral part of the heel. This had totally separated. We have been using silver alginate to the wound areas. She arrived in a scooter telling me she is offloading the foot is much as possible. I think there is probably chronic infection here although her recent MRI did not show osteomyelitis Objective Constitutional Sitting or standing Blood Pressure is within target range for patient.. Pulse regular and within target range for patient.[name] Kitchen Respirations regular, non-labored and within target range.. Temperature is normal and within the target range for the patient.[name] Kitchen Appears in no distress. Vitals Time Taken: 3:48 PM, Height: 62 in, Weight: 180 lbs, BMI: 32.9, Temperature: 98.6 F, Pulse: 80 bpm, Respiratory Rate: 16 breaths/min, Blood Pressure: 125/47 mmHg. General Notes: Wound exam; punched open wound in the plantar heel. This is clearly down to the muscular layer of the heel at the bottom. The callus around this is separated along with some underlying subcutaneous tissue. All of this removed with a #15 scalpel and pickups. Underlying subcutaneous tissue also debrided to get some sort of surface. Integumentary (Hair, Skin) Wound #3 status is Open. Original cause of wound was Trauma. The wound is located on the Left Calcaneus. The wound measures 3cm length x 5cm width x 1cm depth; 11.781cm^2 area and 11.781cm^3 volume. There is Fat Layer (Subcutaneous Tissue) Exposed exposed. There is no tunneling noted, however, there is undermining starting at 6:00 and ending at 10:00 with a maximum  distance of 2.5cm. There is a medium amount of serosanguineous drainage noted. The wound margin is flat and intact. There is large (67-100%) pink granulation within the wound bed. There is a small (1-33%) amount of necrotic tissue within the wound bed including Adherent Slough. Wound #6 status is Open. Original cause of wound was Blister. The wound is located on the Left Toe Great. The wound measures 0.3cm length x 0.2cm width x 0.3cm depth; 0.047cm^2 area and 0.014cm^3 volume. There is Fat Layer (Subcutaneous Tissue) Exposed exposed. There is no tunneling or undermining noted. There is a small amount of serosanguineous drainage noted. The wound margin is flat and intact. There is large (67-100%) red granulation within the wound bed. There is no necrotic tissue within the wound bed. 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 There was a Excisional Skin/Subcutaneous Tissue Debridement with a total area of 15 sq cm performed by Ricard Dillon., MD. With the following instrument(s): Blade, Curette, and Forceps to remove Viable and Non-Viable tissue/material. Material removed includes Callus, Subcutaneous Tissue, and Slough after achieving pain control using Other (Benzocaine 20%). No specimens were taken. A time out was conducted at 16:38, prior to the start of the procedure. A Minimum amount of bleeding was controlled with Pressure. The procedure was tolerated well with a pain level of 0 throughout and a pain level of 0 following the procedure. Post Debridement Measurements: 5.5cm length x 5cm width x 1cm  depth; 21.598cm^3 volume. Character of Wound/Ulcer Post Debridement is improved. Severity of Tissue Post Debridement is: Fat layer exposed. Post  procedure Diagnosis Wound #3: Same as Pre-Procedure Plan Follow-up Appointments: Return Appointment in 2 weeks. Dressing Change Frequency: Wound #3 Left Calcaneus: Change Dressing every other day. Wound #6 Left Toe Great: Change Dressing every other day. Wound Cleansing: Wound #3 Left Calcaneus: May shower and wash wound with soap and water. Wound #6 Left Toe Great: May shower and wash wound with soap and water. Primary Wound Dressing: Wound #3 Left Calcaneus: Calcium Alginate with Silver Wound #6 Left Toe Great: Calcium Alginate with Silver Secondary Dressing: Wound #3 Left Calcaneus: Kerlix/Rolled Gauze Dry Gauze Other: - felt callous pad Wound #6 Left Toe Great: Kerlix/Rolled Gauze Dry Gauze Other: - felt callous pad or foam donut Edema Control: Elevate legs to the level of the heart or above for 30 minutes daily and/or when sitting, a frequency of: - throughout the day Off-Loading: Other: - use knee scooter or motorized chair , minimal weight bearing left foot 1. Continue with silver alginate. 2. Try to offload this is much as possible. When we had a total contact cast on this wound just about healed 3. She is still on daptomycin as well as Flagyl which should help for any concomitant infection in the left foot. Oral vancomycin out of concern for pseudomembranous colitis. 4. The patient looks as though she is losing weight frail. Electronic Signature(s) Signed: 09/19/2019 6:05:38 PM By: Linton Ham MD Entered By: Linton Ham on 09/19/2019 17:39:59 -------------------------------------------------------------------------------- SuperBill Details Patient Name: Date of Service: NICOSHA, STRUVE 09/19/2019 Medical Record [id number] Patient Account Number: [id number] Date of Birth/Sex: Treating RN: 1955/12/10  (63 y.o. 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 L97.522 Non-pressure chronic ulcer of other part of left foot with fat layer exposed E11.621 Type 2 diabetes mellitus with foot ulcer Facility Procedures CPT4 Code Description: [id number] 11042 - DEB SUBQ TISSUE 20 SQ CM/< ICD-10 Diagnosis Description L97.423 Non-pressure chronic ulcer of left heel and midfoot with Modifier: necrosis of Quantity: 1 muscle Physician Procedures CPT4 Code Description: [id number] - WC PHYS SUBQ TISS 20 SQ CM ICD-10 Diagnosis Description L97.423 Non-pressure chronic ulcer of left heel and midfoot with Modifier: necrosis of Quantity: 1 muscle Electronic Signature(s) Signed: 09/19/2019 6:05:38 PM By: Linton Ham MD Entered By: Linton Ham on 09/19/2019 17:40:17

## 2019-09-22 NOTE — Telephone Encounter (Signed)
Requesting: Lyrica Contract: 08/16/2017 UDS: 05/06/2018, low risk Last OV: 09/18/2019 Next OV: N/A Last Refill: 06/20/2019, #180--1 RF Database:   Please advise

## 2019-09-22 NOTE — Telephone Encounter (Signed)
Medication Refill - Medication: pregabalin (LYRICA) 150 MG capsule   Has the patient contacted their pharmacy? Yes.   (Agent: If no, request that the patient contact the pharmacy for the refill.) (Agent: If yes, when and what did the pharmacy advise?)  Preferred Pharmacy (with phone number or street name):  [facility], [place] [phone] (Phone) [phone] (Fax)    Agent: Please be advised that RX refills may take up to 3 business days. We ask that you follow-up with your pharmacy.

## 2019-09-22 NOTE — Progress Notes
Noted, no change in tx.

## 2019-09-24 ENCOUNTER — Other Ambulatory Visit: Payer: Self-pay

## 2019-09-24 ENCOUNTER — Ambulatory Visit: Payer: PRIVATE HEALTH INSURANCE | Admitting: Internal Medicine

## 2019-09-24 ENCOUNTER — Ambulatory Visit (INDEPENDENT_AMBULATORY_CARE_PROVIDER_SITE_OTHER): Payer: PRIVATE HEALTH INSURANCE | Admitting: Internal Medicine

## 2019-09-24 ENCOUNTER — Encounter: Payer: Self-pay | Admitting: Internal Medicine

## 2019-09-24 ENCOUNTER — Telehealth: Payer: Self-pay | Admitting: *Deleted

## 2019-09-24 DIAGNOSIS — R197 Diarrhea, unspecified: Secondary | ICD-10-CM

## 2019-09-24 DIAGNOSIS — M00021 Staphylococcal arthritis, right elbow: Secondary | ICD-10-CM | POA: Diagnosis not present

## 2019-09-24 DIAGNOSIS — L97421 Non-pressure chronic ulcer of left heel and midfoot limited to breakdown of skin: Secondary | ICD-10-CM | POA: Diagnosis not present

## 2019-09-24 NOTE — Telephone Encounter (Signed)
Per Dr Megan Salon called [facility] to advise to extend the patient IV Dapto until appt 10/21/19 at which time we will let them know if we will extend the medication or pull PICC. [name] [name] took order and read it back for confirmation.

## 2019-09-24 NOTE — Progress Notes (Signed)
[facility] for Infectious Disease  Patient Active Problem List   Diagnosis Date Noted   Septic arthritis of elbow, right ([place]) 04/17/2019    Priority: High   Diarrhea 09/04/2019    Priority: Medium   Ulcer of left heel, limited to breakdown of skin ([place]) 02/01/2017    Priority: Medium   Pressure injury of skin 04/17/2019   Retinopathy 04/17/2019   Renal transplant, 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   Sleep apnea 11/16/2017   Diabetic foot infection ([place]) 04/06/2017   Type 2 diabetes mellitus with hyperglycemia, with long-term current use of insulin ([place]) 04/06/2017   Neuropathy 05/05/2015   Diabetic peripheral neuropathy ([place]) 01/12/2015   CKD (chronic kidney disease), stage III ([place]) 05/27/2014   History of MI (myocardial infarction) 10/06/2013   Obesity (BMI 30-39.9) 09/20/2013   Multinodular goiter 04/17/2013   Normocytic anemia 02/03/2013   CAD (coronary artery disease) 01/11/2012   Hepatic cirrhosis ([place]) 07/06/2010   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   Hyperlipidemia 06/03/2008   Essential hypertension 12/23/2006    Patient's Medications  New Prescriptions   No medications on file  Previous Medications   AMINO ACIDS-PROTEIN HYDROLYS (FEEDING SUPPLEMENT, PRO-STAT SUGAR FREE 64,) LIQD    Take 30 mLs by mouth 2 (two) times daily.   ASPIRIN EC 81 MG EC TABLET    Take 1 tablet (81 mg total) by mouth 2 (two) times daily.   ATORVASTATIN (LIPITOR) 10 MG TABLET    Take 1 tablet by mouth once daily   CALCIUM ALGINATE EX    Apply 1 patch topically every other day.   CYCLOBENZAPRINE (FLEXERIL) 10 MG TABLET    Take 1 tablet (10 mg total) by mouth 3 (three) times daily as needed for up to 15 doses for muscle spasms.   DAPTOMYCIN 661 MG IN SODIUM CHLORIDE 0.9 % 100 ML    Inject 661 mg into the vein daily at 8 pm.   DOCUSATE SODIUM (COLACE) 100 MG CAPSULE    Take 100 mg by mouth daily.   DULOXETINE (CYMBALTA) 60 MG CAPSULE    Take 1 capsule by mouth once daily    ENSURE MAX PROTEIN (ENSURE MAX PROTEIN) LIQD    Take 330 mLs (11 oz total) by mouth 2 (two) times daily.   EPINEPHRINE 0.3 MG/0.3 ML IJ SOAJ INJECTION    Inject into the muscle.   FERROUS SULFATE (IRON) 325 (65 FE) MG TABS    Take 1 tablet by mouth daily.    FLUCONAZOLE (DIFLUCAN) 150 MG TABLET    1 po x1, may repeat in 3 days prn   FLUTICASONE (FLONASE) 50 MCG/ACT NASAL SPRAY    Place 2 sprays into both nostrils daily.   GUAIFENESIN (MUCINEX) 600 MG 12 HR TABLET    Take 1 tablet (600 mg total) by mouth 2 (two) times daily.   HYDROCHLOROTHIAZIDE (HYDRODIURIL) 25 MG TABLET    Take 1 tablet (25 mg total) by mouth daily. RESUME AFTER EVALUATED BY PCP   HYDROCODONE-ACETAMINOPHEN (NORCO/VICODIN) 5-325 MG TABLET    Take 1 tablet by mouth every 6 (six) hours as needed for moderate pain.   INSULIN GLARGINE (LANTUS SOLOSTAR) 100 UNIT/ML SOLOSTAR PEN    INJECT 20 UNITS SUBCUTANEOUSLY ONCE DAILY IN THE MORNING AND THEN INJECT 40 UNITS AT BEDTIME   INSULIN LISPRO (HUMALOG KWIKPEN) 200 UNIT/ML SOPN    Inject 13 Units into the skin daily.   METRONIDAZOLE (FLAGYL) 500 MG TABLET    Take 1 tablet (500 mg  total) by mouth 3 (three) times daily.   MULTIPLE VITAMINS-MINERALS (MULTIVITAMIN GUMMIES WOMENS) CHEW    Chew 2 each by mouth daily.   NAFTIFINE HCL 2 % CREA    Apply qd for up to 4 weeks   NUTRITIONAL SUPPLEMENTS (FEEDING SUPPLEMENT, NEPRO CARB STEADY,) LIQD    Take 237 mLs by mouth 3 (three) times daily as needed (Supplement).   NYSTATIN (MYCOSTATIN/NYSTOP) POWDER    Apply topically 3 (three) times daily.   ONDANSETRON (ZOFRAN ODT) 4 MG DISINTEGRATING TABLET    Take 1 tablet (4 mg total) by mouth every 8 (eight) hours as needed for nausea or vomiting.   ONDANSETRON (ZOFRAN) 4 MG TABLET    Take 1 tablet (4 mg total) by mouth every 8 (eight) hours as needed for nausea or vomiting.   POLYETHYLENE GLYCOL (MIRALAX / GLYCOLAX) PACKET    Take 17 g by mouth daily as needed for mild constipation.    PREGABALIN (LYRICA)  150 MG CAPSULE    Take 1 capsule (150 mg total) by mouth 2 (two) times daily.   VANCOMYCIN (VANCOCIN) 125 MG CAPSULE    Take 1 capsule (125 mg total) by mouth 4 (four) times daily.  Modified Medications   No medications on file  Discontinued Medications   No medications on file    Subjective: [name] [name] is in for her routine follow-up visit..  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  Shortly afterwards she began to have daily bouts of watery diarrhea.  I stopped her levofloxacin and added oral vancomycin.  Her diarrhea is a little better.  She is having soft stools that she describes as looking like "mud".  She is also had some mild headache.  She has had nausea and anorexia and is losing weight.  Ondansetron helps her with her nausea.  She has not had any fever.  She has noted improvement in her right elbow swelling and drainage and her husband told her that her left heel wound seems to be getting smaller.    She tells me that she was seen by an orthopedic surgeon at [facility] recently.  I cannot see those records in [facility].  She says that she will be having surgery on her right elbow sometime early next year.  Date has not been set yet.    She says that her husband did bring in a stool specimen on 09/11/2019 but, unfortunately, it never got to our lab.  She did start on oral vancomycin 1 week ago.  She has had some improvement in her diarrhea.  Review of Systems: Review of Systems  Constitutional: Positive for malaise/fatigue and weight loss. Negative for fever.  HENT: Negative for congestion and sore throat.   Respiratory: Negative for cough and shortness of breath.   Cardiovascular: Negative for chest pain.  Gastrointestinal: Positive for abdominal pain, diarrhea and nausea. Negative for vomiting.  Musculoskeletal: Positive for joint pain.   Neurological: Positive for headaches.    Past Medical History:  Diagnosis Date   Acute MI [facility]) 2007   presented to ED & had cardiac cath- but found to have normal coronaries. Since that point in time her PCP cares f or cardiac needs. Dr. [name] - [facility] [place]   Anemia    Anginal pain [facility])    Anxiety    Asthma    Bulging lumbar disc  Cataract    Chronic kidney disease    "had transplant when I was 8; doesn't bother me now" (03/20/2013)   Cirrhosis of liver without mention of alcohol    Constipation    Dehiscence of closure of skin    left partial calcaneal excision   Depression    Diabetes mellitus    insulin dependent, adult onset   Episode of visual loss of left eye    Exertional shortness of breath    Fatty liver    Fibromyalgia    GERD (gastroesophageal reflux disease)    Hepatic steatosis    High cholesterol    Hypertension    MRSA (methicillin resistant Staphylococcus aureus)    Neuropathy    lower legs   Osteoarthritis    hands, hips   Proximal humerus fracture 10/15/12   Left   PTSD (post-traumatic stress disorder)    Renal insufficiency 05/05/2015    Social History   Tobacco Use   Smoking status: Never Smoker   Smokeless tobacco: Never Used  Substance Use Topics   Alcohol use: No    Alcohol/week: 0.0 standard drinks   Drug use: No    Family History  Problem Relation Age of Onset   Heart disease Father    Diabetes Father    Colitis Father    Crohn's disease Father    Cancer Father        leukemia   Leukemia Father    Diabetes Mellitus II Brother    Kidney disease Brother    Heart disease Brother    Diabetes Mother    Hypertension Mother    Mental illness Mother    Irritable bowel syndrome Daughter    Diabetes Mellitus II Brother    Kidney disease Brother    Liver disease Brother    Kidney disease Brother    Heart attack Brother    Diabetes Mellitus II Brother    Heart disease  Brother    Liver disease Brother    Kidney disease Brother    Kidney disease Brother    Diabetes Mellitus II Brother    Diabetes Mellitus I Brother     Allergies  Allergen Reactions   Bee Pollen Anaphylaxis   Fish-Derived Products Hives, Shortness Of Breath, Swelling and Rash    Hives get in throat causing trouble breathing   Mushroom Extract Complex Anaphylaxis   Penicillins Anaphylaxis    Did it involve swelling of the face/tongue/throat, SOB, or low BP? Yes Did it involve sudden or severe rash/hives, skin peeling, or any reaction on the inside of your mouth or nose? No Did you need to seek medical attention at a hospital or doctor's office? Yes When did it last happen?A few months ago If all above answers are NO, may proceed with cephalosporin use.   Rosemary Oil Anaphylaxis   Shellfish Allergy Hives, Shortness Of Breath, Swelling and Rash   Tomato Hives and Shortness Of Breath    Hives in throat causes her trouble breathing   Acetaminophen Other (See Comments)    GI upset   Acyclovir And Related Other (See Comments)    Unknown rxn   Aloe Vera Hives   Broccoli [Brassica Oleracea] Hives   Naproxen Other (See Comments)    Unknown rxn    Objective: Vitals:   09/24/19 1345  BP: (!) 118/57  Pulse: 76   There is no height or weight on file to calculate BMI.  Physical Exam Constitutional:      Comments: She is  pleasant and talkative.  She is seated in her wheelchair.  Cardiovascular:     Rate and Rhythm: Normal rate and regular rhythm.     Heart sounds: No murmur.  Pulmonary:     Effort: Pulmonary effort is normal.     Breath sounds: Normal breath sounds.  Chest:    Abdominal:     Palpations: Abdomen is soft.     Tenderness: There is no abdominal tenderness.  Musculoskeletal:     Comments: She has a clean dry dressings on her right elbow and left foot.  Psychiatric:        Mood and Affect: Mood normal.     Left heel   Right  elbow  Lab Results Sedimentation rate 09/15/2019: 53 C-reactive protein 09/15/2019: 0.9 CK 09/15/2019: Normal at 108 Creatinine 09/15/2019: Stable 1.9 Problem List Items Addressed This Visit      High   Septic arthritis of elbow, right (H[facility])    Right elbow swelling has improved significantly and she is having decreased drainage from her sinus tract.  She is tolerating IV daptomycin.  I will continue it for now and see her back on 10/21/2019.        Medium   Ulcer of left heel, limited to breakdown of skin ([place])    It appears that her left heel wound is improving slowly.  I will continue daptomycin and metronidazole.      Diarrhea    I am still concerned about C. difficile colitis.  She did bring in a stool sample today that I will submit for testing.  We will have her continue empiric oral vancomycin and metronidazole (doing double duty for her left heel infection as well).      Relevant Orders   Clostridium difficile Toxin B, Qualitative, Real-Time PCR([facility])       Michel Bickers, MD [facility] [place] [phone] pager   [phone] cell 09/24/2019, 2:19 PM

## 2019-09-24 NOTE — Assessment & Plan Note (Signed)
Right elbow swelling has improved significantly and she is having decreased drainage from her sinus tract.  She is tolerating IV daptomycin.  I will continue it for now and see her back on 10/21/2019.

## 2019-09-24 NOTE — Assessment & Plan Note (Signed)
It appears that her left heel wound is improving slowly.  I will continue daptomycin and metronidazole.

## 2019-09-24 NOTE — Assessment & Plan Note (Signed)
I am still concerned about C. difficile colitis.  She did bring in a stool sample today that I will submit for testing.  We will have her continue empiric oral vancomycin and metronidazole (doing double duty for her left heel infection as well).

## 2019-09-26 LAB — CLOSTRIDIUM DIFFICILE TOXIN B, QUALITATIVE, REAL-TIME PCR: Toxigenic C. Difficile by PCR: NOT DETECTED

## 2019-09-29 ENCOUNTER — Other Ambulatory Visit (HOSPITAL_COMMUNITY)
Admission: RE | Admit: 2019-09-29 | Discharge: 2019-09-29 | Disposition: A | Discharge status: Home / Self Care | Payer: PRIVATE HEALTH INSURANCE | Source: Other Acute Inpatient Hospital | Attending: Internal Medicine | Admitting: Internal Medicine

## 2019-09-29 DIAGNOSIS — M86171 Other acute osteomyelitis, right ankle and foot: Secondary | ICD-10-CM | POA: Diagnosis not present

## 2019-09-29 DIAGNOSIS — N390 Urinary tract infection, site not specified: Secondary | ICD-10-CM | POA: Insufficient documentation

## 2019-09-29 DIAGNOSIS — E11621 Type 2 diabetes mellitus with foot ulcer: Secondary | ICD-10-CM | POA: Diagnosis present

## 2019-09-29 DIAGNOSIS — T847XXA Infection and inflammatory reaction due to other internal orthopedic prosthetic devices, implants and grafts, initial encounter: Secondary | ICD-10-CM | POA: Insufficient documentation

## 2019-09-29 LAB — URINE CYTOLOGY ANCILLARY ONLY
Bacterial Vaginitis-Urine: NEGATIVE
Candida Urine: POSITIVE — AB
Chlamydia: NEGATIVE
Comment: NEGATIVE
Comment: NEGATIVE
Comment: NORMAL
Neisseria Gonorrhea: NEGATIVE
Trichomonas: NEGATIVE

## 2019-09-29 LAB — BASIC METABOLIC PANEL
Anion gap: 9 (ref 5–15)
BUN: 21 mg/dL (ref 8–23)
CO2: 24 mmol/L (ref 22–32)
Calcium: 8.6 mg/dL — ABNORMAL LOW (ref 8.9–10.3)
Chloride: 102 mmol/L (ref 98–111)
Creatinine, Ser: 1.78 mg/dL — ABNORMAL HIGH (ref 0.44–1.00)
GFR calc Af Amer: 35 mL/min — ABNORMAL LOW (ref >60)
GFR calc non Af Amer: 30 mL/min — ABNORMAL LOW (ref >60)
Glucose, Bld: 468 mg/dL — ABNORMAL HIGH (ref 70–99)
Potassium: 4.4 mmol/L (ref 3.5–5.1)
Sodium: 135 mmol/L (ref 135–145)

## 2019-09-29 LAB — CBC
HCT: 34.6 % — ABNORMAL LOW (ref 36.0–46.0)
Hemoglobin: 10.6 g/dL — ABNORMAL LOW (ref 12.0–15.0)
MCH: 26.8 pg (ref 26.0–34.0)
MCHC: 30.6 g/dL (ref 30.0–36.0)
MCV: 87.6 fL (ref 80.0–100.0)
Platelets: 124 10*3/uL — ABNORMAL LOW (ref 150–400)
RBC: 3.95 MIL/uL (ref 3.87–5.11)
RDW: 16.5 % — ABNORMAL HIGH (ref 11.5–15.5)
WBC: 4.7 10*3/uL (ref 4.0–10.5)
nRBC: 0 % (ref 0.0–0.2)

## 2019-09-29 LAB — C-REACTIVE PROTEIN: CRP: 1.1 mg/dL — ABNORMAL HIGH (ref <1.0)

## 2019-09-29 LAB — CK: Total CK: 191 U/L (ref 38–234)

## 2019-09-30 ENCOUNTER — Telehealth: Payer: Self-pay | Admitting: *Deleted

## 2019-09-30 NOTE — Telephone Encounter (Signed)
Patient called to report that since yesterday 09/29/19 she has had nausea, vomiting, and diarrhea. She also reports headache and numbness in her hands that is new. She was able to eat some toast and applesauce and keep it down about an hour ago. She states she feels real bad and she wants to know what she should do. The Zofran is not working for her at this time. Advised her will let the provider know and someone will give her a call back soon.

## 2019-10-01 NOTE — Telephone Encounter (Signed)
Please see if [name] [name] can come in tomorrow morning for me to evaluate her.

## 2019-10-01 NOTE — Telephone Encounter (Signed)
Called the patient to see about having her come for a visit per Dr Megan Salon and she advised she can only come in the afternoon as her husband has to bring her in. Gave her an office visit for 145 10/02/19.

## 2019-10-02 ENCOUNTER — Ambulatory Visit (INDEPENDENT_AMBULATORY_CARE_PROVIDER_SITE_OTHER): Payer: PRIVATE HEALTH INSURANCE | Admitting: Internal Medicine

## 2019-10-02 ENCOUNTER — Other Ambulatory Visit: Payer: Self-pay

## 2019-10-02 ENCOUNTER — Encounter: Payer: Self-pay | Admitting: Internal Medicine

## 2019-10-02 DIAGNOSIS — M00021 Staphylococcal arthritis, right elbow: Secondary | ICD-10-CM | POA: Diagnosis not present

## 2019-10-02 NOTE — Assessment & Plan Note (Signed)
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  She thinks that her symptoms may be a little bit better since holding her daptomycin 2 days ago.  I will have her also stop oral metronidazole and vancomycin now and see her back in 4 days.

## 2019-10-02 NOTE — Progress Notes (Signed)
[facility] for Infectious Disease  Patient Active Problem List   Diagnosis Date Noted   Septic arthritis of elbow, right ([place]) 04/17/2019    Priority: High   Diarrhea 09/04/2019    Priority: Medium   Ulcer of left heel, limited to breakdown of skin ([place]) 02/01/2017    Priority: Medium   Pressure injury of skin 04/17/2019   Retinopathy 04/17/2019   Renal transplant, 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   Sleep apnea 11/16/2017   Diabetic foot infection ([place]) 04/06/2017   Type 2 diabetes mellitus with hyperglycemia, with long-term current use of insulin ([place]) 04/06/2017   Neuropathy 05/05/2015   Diabetic peripheral neuropathy ([place]) 01/12/2015   CKD (chronic kidney disease), stage III ([place]) 05/27/2014   History of MI (myocardial infarction) 10/06/2013   Obesity (BMI 30-39.9) 09/20/2013   Multinodular goiter 04/17/2013   Normocytic anemia 02/03/2013   CAD (coronary artery disease) 01/11/2012   Hepatic cirrhosis ([place]) 07/06/2010   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   Hyperlipidemia 06/03/2008   Essential hypertension 12/23/2006    Patient's Medications  New Prescriptions   No medications on file  Previous Medications   AMINO ACIDS-PROTEIN HYDROLYS (FEEDING SUPPLEMENT, PRO-STAT SUGAR FREE 64,) LIQD    Take 30 mLs by mouth 2 (two) times daily.   ASPIRIN EC 81 MG EC TABLET    Take 1 tablet (81 mg total) by mouth 2 (two) times daily.   ATORVASTATIN (LIPITOR) 10 MG TABLET    Take 1 tablet by mouth once daily   CALCIUM ALGINATE EX    Apply 1 patch topically every other day.   CYCLOBENZAPRINE (FLEXERIL) 10 MG TABLET    Take 1 tablet (10 mg total) by mouth 3 (three) times daily as needed for up to 15 doses for muscle spasms.   DAPTOMYCIN 661 MG IN SODIUM CHLORIDE 0.9 % 100 ML    Inject 661 mg into the vein daily at 8 pm.   DOCUSATE SODIUM (COLACE) 100 MG CAPSULE    Take 100 mg by mouth daily.   DULOXETINE (CYMBALTA) 60 MG CAPSULE    Take 1 capsule by mouth once daily   ENSURE MAX PROTEIN (ENSURE MAX PROTEIN) LIQD    Take 330 mLs (11 oz total) by mouth 2 (two) times daily.   EPINEPHRINE 0.3 MG/0.3 ML IJ SOAJ INJECTION    Inject into the muscle.   FERROUS SULFATE (IRON) 325 (65 FE) MG TABS    Take 1 tablet by mouth daily.    FLUCONAZOLE (DIFLUCAN) 150 MG TABLET    1 po x1, may repeat in 3 days prn   FLUTICASONE (FLONASE) 50 MCG/ACT NASAL SPRAY    Place 2 sprays into both nostrils daily.   GUAIFENESIN (MUCINEX) 600 MG 12 HR TABLET    Take 1 tablet (600 mg total) by mouth 2 (two) times daily.   HYDROCHLOROTHIAZIDE (HYDRODIURIL) 25 MG TABLET    Take 1 tablet (25 mg total) by mouth daily. RESUME AFTER EVALUATED BY PCP   HYDROCODONE-ACETAMINOPHEN (NORCO/VICODIN) 5-325 MG TABLET    Take 1 tablet by mouth every 6 (six) hours as needed for moderate pain.   INSULIN GLARGINE (LANTUS SOLOSTAR) 100 UNIT/ML SOLOSTAR PEN    INJECT 20 UNITS SUBCUTANEOUSLY ONCE DAILY IN THE MORNING AND THEN INJECT 40 UNITS AT BEDTIME   INSULIN LISPRO (HUMALOG KWIKPEN) 200 UNIT/ML SOPN    Inject 13 Units into the skin daily.   METRONIDAZOLE (FLAGYL) 500 MG TABLET    Take 1 tablet (500 mg total)  by mouth 3 (three) times daily.   MULTIPLE VITAMINS-MINERALS (MULTIVITAMIN GUMMIES WOMENS) CHEW    Chew 2 each by mouth daily.   NAFTIFINE HCL 2 % CREA    Apply qd for up to 4 weeks   NUTRITIONAL SUPPLEMENTS (FEEDING SUPPLEMENT, NEPRO CARB STEADY,) LIQD    Take 237 mLs by mouth 3 (three) times daily as needed (Supplement).   NYSTATIN (MYCOSTATIN/NYSTOP) POWDER    Apply topically 3 (three) times daily.   ONDANSETRON (ZOFRAN ODT) 4 MG DISINTEGRATING TABLET    Take 1 tablet (4 mg total) by mouth every 8 (eight) hours as needed for nausea or vomiting.   ONDANSETRON (ZOFRAN) 4 MG TABLET    Take 1 tablet (4 mg total) by mouth every 8 (eight) hours as needed for nausea or vomiting.   POLYETHYLENE GLYCOL (MIRALAX / GLYCOLAX) PACKET    Take 17 g by mouth daily as needed for mild constipation.    PREGABALIN (LYRICA)  150 MG CAPSULE    Take 1 capsule (150 mg total) by mouth 2 (two) times daily.   VANCOMYCIN (VANCOCIN) 125 MG CAPSULE    Take 1 capsule (125 mg total) by mouth 4 (four) times daily.  Modified Medications   No medications on file  Discontinued Medications   No medications on file    Subjective: [name] [name] is in for an urgent work in visit.  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  Shortly afterwards she began to have daily bouts of watery diarrhea.  I stopped her levofloxacin and added oral vancomycin.  Her diarrhea is a little better.  She is having soft stools that she describes as looking like "mud".  She is also had some mild headache.  She has had nausea and anorexia and is losing weight.  Ondansetron helps her with her nausea.  She has not had any fever.  She has noted improvement in her right elbow swelling and drainage and her husband told her that her left heel wound seems to be getting smaller.    She tells me that she was seen by an orthopedic surgeon at [facility] recently.  I cannot see those records in [facility]where.  She says that she will be having surgery on her right elbow sometime early next year.  She was told to call on 10/17/2019 schedule a time for surgery.  After her visit 1 week ago she did bring in a stool sample which was negative for C. difficile.  She called yesterday to tell us that she was still being bothered by nausea, vomiting and diarrhea.  She says that she also began to develop numbness, tingling and burning pain in her hands shortly after her last visit 1 week ago.  She says that her hands will intermittently sweat and be very dry.  She says that her palms will feel like they are burning.  Her hands are very stiff.  Her husband had her stop the IV daptomycin 2 days ago.  He says that she feels like her hand symptoms may be a little  bit better today.  She has continued to take oral metronidazole and vancomycin.  She reiterates that she never drinks any alcohol.  She feels like her right elbow and left heel are doing significantly better since restarting antibiotics a little over 1 month ago.  Review of Systems: Review of Systems  Constitutional: Positive for  malaise/fatigue and weight loss. Negative for fever.  HENT: Negative for congestion and sore throat.   Respiratory: Negative for cough and shortness of breath.   Cardiovascular: Negative for chest pain.  Gastrointestinal: Positive for abdominal pain, diarrhea and nausea. Negative for vomiting.  Musculoskeletal: Positive for joint pain.       As noted in HPI.  Skin:       Very dry skin on her hands recently.  Neurological: Positive for tingling, sensory change and headaches.    Past Medical History:  Diagnosis Date   Acute MI [facility]) 2007   presented to ED & had cardiac cath- but found to have normal coronaries. Since that point in time her PCP cares f or cardiac needs. Dr. [name] - [facility] [place]   Anemia    Anginal pain ([place])    Anxiety    Asthma    Bulging lumbar disc    Cataract    Chronic kidney disease    "had transplant when I was 13; doesn't bother me now" (03/20/2013)   Cirrhosis of liver without mention of alcohol    Constipation    Dehiscence of closure of skin    left partial calcaneal excision   Depression    Diabetes mellitus    insulin dependent, adult onset   Episode of visual loss of left eye    Exertional shortness of breath    Fatty liver    Fibromyalgia    GERD (gastroesophageal reflux disease)    Hepatic steatosis    High cholesterol    Hypertension    MRSA (methicillin resistant Staphylococcus aureus)    Neuropathy    lower legs   Osteoarthritis    hands, hips   Proximal humerus fracture 10/15/12   Left   PTSD (post-traumatic stress disorder)    Renal insufficiency 05/05/2015    Social  History   Tobacco Use   Smoking status: Never Smoker   Smokeless tobacco: Never Used  Substance Use Topics   Alcohol use: No    Alcohol/week: 0.0 standard drinks   Drug use: No    Family History  Problem Relation Age of Onset   Heart disease Father    Diabetes Father    Colitis Father    Crohn's disease Father    Cancer Father        leukemia   Leukemia Father    Diabetes Mellitus II Brother    Kidney disease Brother    Heart disease Brother    Diabetes Mother    Hypertension Mother    Mental illness Mother    Irritable bowel syndrome Daughter    Diabetes Mellitus II Brother    Kidney disease Brother    Liver disease Brother    Kidney disease Brother    Heart attack Brother    Diabetes Mellitus II Brother    Heart disease Brother    Liver disease Brother    Kidney disease Brother    Kidney disease Brother    Diabetes Mellitus II Brother    Diabetes Mellitus I Brother     Allergies  Allergen Reactions   Bee Pollen Anaphylaxis   Fish-Derived Products Hives, Shortness Of Breath, Swelling and Rash    Hives get in throat causing trouble breathing   Mushroom Extract Complex Anaphylaxis   Penicillins Anaphylaxis    Did it involve swelling of the face/tongue/throat, SOB, or low BP? Yes Did it involve sudden or severe rash/hives, skin peeling, or any reaction on the inside of your mouth  or nose? No Did you need to seek medical attention at a hospital or doctor's office? Yes When did it last happen?A few months ago If all above answers are NO, may proceed with cephalosporin use.   Rosemary Oil Anaphylaxis   Shellfish Allergy Hives, Shortness Of Breath, Swelling and Rash   Tomato Hives and Shortness Of Breath    Hives in throat causes her trouble breathing   Acetaminophen Other (See Comments)    GI upset   Acyclovir And Related Other (See Comments)    Unknown rxn   Aloe Vera Hives   Broccoli [Brassica Oleracea] Hives    Naproxen Other (See Comments)    Unknown rxn    Objective: Vitals:   10/02/19 1341  BP: 132/81  Pulse: 80  Temp: 98.2 F (36.8 C)  TempSrc: Oral   There is no height or weight on file to calculate BMI.  Physical Exam Constitutional:      Comments: She is pleasant, appropriately worried and talkative.  She is seated in her wheelchair.  Cardiovascular:     Rate and Rhythm: Normal rate and regular rhythm.     Heart sounds: No murmur.  Pulmonary:     Effort: Pulmonary effort is normal.     Breath sounds: Normal breath sounds.  Chest:    Abdominal:     Palpations: Abdomen is soft.     Tenderness: There is no abdominal tenderness.  Musculoskeletal:     Comments: She has a clean dry dressings on her right elbow and left foot.  She has very dry skin on her hands.  Her hands seem somewhat stiff but she can form a fist bilaterally.  Her hands seem warm and well perfused.  Psychiatric:        Mood and Affect: Mood normal.     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  She thinks that her symptoms may be a little bit better since holding her daptomycin 2 days ago.  I will have her also stop oral metronidazole and vancomycin now and see her back in 4 days.          Michel Bickers, MD [facility] for Infectious [place] [facility] [phone] pager   [phone] cell 10/02/2019, 2:19 PM

## 2019-10-03 ENCOUNTER — Ambulatory Visit (HOSPITAL_BASED_OUTPATIENT_CLINIC_OR_DEPARTMENT_OTHER): Payer: PRIVATE HEALTH INSURANCE | Admitting: Internal Medicine

## 2019-10-06 ENCOUNTER — Ambulatory Visit (INDEPENDENT_AMBULATORY_CARE_PROVIDER_SITE_OTHER): Payer: PRIVATE HEALTH INSURANCE | Admitting: Internal Medicine

## 2019-10-06 ENCOUNTER — Encounter: Payer: Self-pay | Admitting: Internal Medicine

## 2019-10-06 ENCOUNTER — Other Ambulatory Visit: Payer: Self-pay

## 2019-10-06 ENCOUNTER — Telehealth: Payer: Self-pay

## 2019-10-06 DIAGNOSIS — M00021 Staphylococcal arthritis, right elbow: Secondary | ICD-10-CM | POA: Diagnosis not present

## 2019-10-06 MED ORDER — VANCOMYCIN HCL IN DEXTROSE 1-5 GM/200ML-% IV SOLN: 1000.0000 mg | INTRAVENOUS | Status: DC

## 2019-10-06 NOTE — Telephone Encounter (Signed)
Voicemail:  Please call regarding patient.   Return call: Pharmacy states patient informed them daptomycin was discontinued by her provider.  They have not received any orders and wondered if Dr Megan Salon stopped IV antibiotics. According to last office visit the patient's  husband stopped her daptomycin.   Please advise. Should IV daptomycin be discontinued?   Laverle Patter, RN

## 2019-10-06 NOTE — Progress Notes (Signed)
Received call from Dr. Megan Salon and verified labs CBC, BMP sed rate CRP and vancomycin trough weekly fax labs to [place] office Vancomycin stop date 11/17/19.   LPN called [facility] and spoke with Coretta and made aware. Eugenia Mcalpine

## 2019-10-06 NOTE — Progress Notes (Signed)
[facility] for Infectious Disease  Patient Active Problem List   Diagnosis Date Noted  . Septic arthritis of elbow, right ([place]) 04/17/2019    Priority: High  . Diarrhea 09/04/2019    Priority: Medium  . Ulcer of left heel, limited to breakdown of skin ([place]) 02/01/2017    Priority: Medium  . Pressure injury of skin 04/17/2019  . 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 Sleep apnea 11/16/2017  . Diabetic foot infection ([place]) 04/06/2017  . Type 2 diabetes mellitus with hyperglycemia, with long-term current use of insulin ([place]) 04/06/2017  . Neuropathy 05/05/2015  . Diabetic peripheral neuropathy ([place]) 01/12/2015  . CKD (chronic kidney disease), stage III ([place]) 05/27/2014  . History of MI (myocardial infarction) 10/06/2013  . Obesity (BMI 30-39.9) 09/20/2013  . 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 Normocytic anemia 02/03/2013  . CAD (coronary artery disease) 01/11/2012  . Hepatic cirrhosis ([place]) 07/06/2010  . 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  . Hyperlipidemia 06/03/2008  . Essential hypertension 12/23/2006    Patient's Medications  New Prescriptions   VANCOMYCIN (VANCOCIN) 1-5 GM/200ML-% SOLN    Inject 200 mLs (1,000 mg total) into the vein daily.  Previous Medications   AMINO ACIDS-PROTEIN HYDROLYS (FEEDING SUPPLEMENT, PRO-STAT SUGAR FREE 64,) LIQD    Take 30 mLs by mouth 2 (two) times daily.   ASPIRIN EC 81 MG EC TABLET    Take 1 tablet (81 mg total) by mouth 2 (two) times daily.   ATORVASTATIN (LIPITOR) 10 MG TABLET    Take 1 tablet by mouth once daily   CALCIUM ALGINATE EX    Apply 1 patch topically every other day.   CYCLOBENZAPRINE (FLEXERIL) 10 MG TABLET    Take 1 tablet (10 mg total) by mouth 3 (three) times daily as needed for up to 15 doses for muscle spasms.   DOCUSATE SODIUM (COLACE) 100 MG CAPSULE    Take 100 mg by mouth daily.   DULOXETINE (CYMBALTA) 60 MG CAPSULE    Take 1 capsule by mouth once daily   ENSURE MAX PROTEIN  (ENSURE MAX PROTEIN) LIQD    Take 330 mLs (11 oz total) by mouth 2 (two) times daily.   EPINEPHRINE 0.3 MG/0.3 ML IJ SOAJ INJECTION    Inject into the muscle.   FERROUS SULFATE (IRON) 325 (65 FE) MG TABS    Take 1 tablet by mouth daily.    FLUCONAZOLE (DIFLUCAN) 150 MG TABLET    1 po x1, may repeat in 3 days prn   FLUTICASONE (FLONASE) 50 MCG/ACT NASAL SPRAY    Place 2 sprays into both nostrils daily.   GUAIFENESIN (MUCINEX) 600 MG 12 HR TABLET    Take 1 tablet (600 mg total) by mouth 2 (two) times daily.   HYDROCHLOROTHIAZIDE (HYDRODIURIL) 25 MG TABLET    Take 1 tablet (25 mg total) by mouth daily. RESUME AFTER EVALUATED BY PCP   HYDROCODONE-ACETAMINOPHEN (NORCO/VICODIN) 5-325 MG TABLET    Take 1 tablet by mouth every 6 (six) hours as needed for moderate pain.   INSULIN GLARGINE (LANTUS SOLOSTAR) 100 UNIT/ML SOLOSTAR PEN    INJECT 20 UNITS SUBCUTANEOUSLY ONCE DAILY IN THE MORNING AND THEN INJECT 40 UNITS AT BEDTIME   INSULIN LISPRO (HUMALOG KWIKPEN) 200 UNIT/ML SOPN    Inject 13 Units into the skin daily.   MULTIPLE VITAMINS-MINERALS (MULTIVITAMIN GUMMIES WOMENS) CHEW    Chew 2 each by mouth daily.   NAFTIFINE HCL 2 % CREA  Apply qd for up to 4 weeks   NUTRITIONAL SUPPLEMENTS (FEEDING SUPPLEMENT, NEPRO CARB STEADY,) LIQD    Take 237 mLs by mouth 3 (three) times daily as needed (Supplement).   NYSTATIN (MYCOSTATIN/NYSTOP) POWDER    Apply topically 3 (three) times daily.   ONDANSETRON (ZOFRAN ODT) 4 MG DISINTEGRATING TABLET    Take 1 tablet (4 mg total) by mouth every 8 (eight) hours as needed for nausea or vomiting.   ONDANSETRON (ZOFRAN) 4 MG TABLET    Take 1 tablet (4 mg total) by mouth every 8 (eight) hours as needed for nausea or vomiting.   POLYETHYLENE GLYCOL (MIRALAX / GLYCOLAX) PACKET    Take 17 g by mouth daily as needed for mild constipation.    PREGABALIN (LYRICA) 150 MG CAPSULE    Take 1 capsule (150 mg total) by mouth 2 (two) times daily.  Modified Medications   No medications on  file  Discontinued Medications   DAPTOMYCIN 661 MG IN SODIUM CHLORIDE 0.9 % 100 ML    Inject 661 mg into the vein daily at 8 pm.   METRONIDAZOLE (FLAGYL) 500 MG TABLET    Take 1 tablet (500 mg total) by mouth 3 (three) times daily.   VANCOMYCIN (VANCOCIN) 125 MG CAPSULE    Take 1 capsule (125 mg total) by mouth 4 (four) times daily.    Subjective: [name] [name] is in for her routine follow-up visit.  When she was here last week I had her stop her IV daptomycin, oral metronidazole and oral vancomycin.  She is feeling much better.  Her nausea, vomiting, diarrhea, headache and the burning and numbness of her hands have all improved.  She is starting to eat better.  Her right elbow drainage and left foot wound are unchanged since her last visit.  Review of Systems: Review of Systems  Constitutional: Positive for malaise/fatigue and weight loss. Negative for fever.  Gastrointestinal: Positive for diarrhea and nausea. Negative for vomiting.  Skin: Negative for rash.  Neurological: Positive for sensory change. Negative for headaches.    Past Medical History:  Diagnosis Date  . Acute MI [facility]) 2007   presented to ED & had cardiac cath- but found to have normal coronaries. Since that point in time her PCP cares f or cardiac needs. Dr. [name] - [facility]  . Anemia   . Anginal pain ([place])   . Anxiety   . Asthma   . Bulging lumbar disc   . Cataract   . 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 Cirrhosis of liver without mention of alcohol   . Constipation   . Dehiscence of closure of skin    left partial calcaneal excision  . Depression   . Diabetes mellitus    insulin dependent, adult onset  . Episode of visual loss of left eye   . Exertional shortness of breath   . Fatty liver   . Fibromyalgia   . GERD (gastroesophageal reflux disease)   . Hepatic steatosis   . High cholesterol   . Hypertension   . MRSA (methicillin resistant  Staphylococcus aureus)   . Neuropathy    lower legs  . Osteoarthritis    hands, hips  . Proximal humerus fracture 10/15/12   Left  . PTSD (post-traumatic stress disorder)   . Renal insufficiency 05/05/2015    Social History   Tobacco Use  . Smoking status: Never Smoker  . Smokeless tobacco: Never Used  Substance Use Topics  . Alcohol use: No    Alcohol/week: 0.0 standard drinks  . Drug use: No    Family History  Problem Relation Age of Onset  . Heart disease Father   . Diabetes Father   . Colitis Father   . Crohn's disease Father   . Cancer Father        leukemia  . Leukemia Father   . Diabetes Mellitus II Brother   . Kidney disease Brother   . Heart disease Brother   . Diabetes Mother   . Hypertension Mother   . Mental illness Mother   . Irritable bowel syndrome Daughter   . Diabetes Mellitus II Brother   . Kidney disease Brother   . Liver disease Brother   . Kidney disease Brother   . Heart attack Brother   . Diabetes Mellitus II Brother   . Heart disease Brother   . Liver disease Brother   . Kidney disease Brother   . Kidney disease Brother   . Diabetes Mellitus II Brother   . Diabetes Mellitus I Brother     Allergies  Allergen Reactions  . Bee Pollen Anaphylaxis  . Fish-Derived Products Hives, Shortness Of Breath, Swelling and Rash    Hives get in throat causing trouble breathing  . Mushroom Extract Complex Anaphylaxis  . Penicillins Anaphylaxis    Did it involve swelling of the face/tongue/throat, SOB, or low BP? Yes Did it involve sudden or severe rash/hives, skin peeling, or any reaction on the inside of your mouth or nose? No Did you need to seek medical attention at a hospital or doctor's office? Yes When did it last happen?A few months ago If all above answers are "NO", may proceed with cephalosporin use.  [name] Kitchen Rosemary Oil Anaphylaxis  . Shellfish Allergy Hives, Shortness Of Breath, Swelling and Rash  . Tomato Hives and Shortness Of Breath     Hives in throat causes her trouble breathing  . Acetaminophen Other (See Comments)    GI upset  . Acyclovir And Related Other (See Comments)    Unknown rxn  . Aloe Vera Hives  . Broccoli [Brassica Oleracea] Hives  . Naproxen Other (See Comments)    Unknown rxn    Objective: Vitals:   10/06/19 1357  BP: 109/68  Pulse: 74  Temp: 98 F (36.7 C)   There is no height or weight on file to calculate BMI.  Physical Exam Constitutional:      Comments: She looks much more comfortable today.  She is still seated in her wheelchair.     Lab Results    Problem List Items Addressed This Visit      High   Septic arthritis of elbow, right [facility])    She believes that her recent acute symptoms were all due to IV daptomycin.  I will switch her back to IV vancomycin and see her back in 1 month.  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      Relevant Medications   vancomycin (VANCOCIN) 1-5 GM/200ML-% SOLN       Michel Bickers, MD [facility] for [facility] [phone] pager   [phone] cell 10/06/2019, 2:13 PM

## 2019-10-06 NOTE — Assessment & Plan Note (Signed)
She believes that her recent acute symptoms were all due to IV daptomycin.  I will switch her back to IV vancomycin and see her back in 1 month.  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

## 2019-10-06 NOTE — Telephone Encounter (Signed)
I "held" all of her antibiotics when she was in last week.  I am seeing her back this afternoon to make a decision about whether or not to restart daptomycin.

## 2019-10-06 NOTE — Progress Notes (Signed)
Called [name] and gave verbal orders to [facility] per Dr. Megan Salon to d/c Daptomycin and start vancomycin 1089m IV daily, LPN will verify end date with Dr. CMegan Salonand weekly/biweekly labs needed.  [name] [name]

## 2019-10-07 NOTE — Progress Notes (Signed)
ADRIEN, DIETZMAN ([id number]) Visit Report for 07/04/2019 Arrival Information Details Patient Name: Date of Service: Marialuisa, Basara 07/04/2019 3:30 PM Medical Record [id number] Patient Account Number: [id number] Date of Birth/Sex: Treating RN: 1956-06-12 (63 y.o. [name] Primary Care Asna Muldrow: Carollee Herter, Kendrick Fries Other Clinician: Referring Britiany Silbernagel: Treating Danny Yackley/Extender:Robson, Graft[place] Folk in Treatment: 59 Visit Information History Since Last Visit Added or deleted any medications: No Patient Arrived: Wheel Chair Any new allergies or adverse reactions: No Arrival Time: 16:04 Had a fall or experienced change in No Accompanied By: self activities of daily living that may affect Transfer Assistance: None risk of falls: Patient Identification Verified: Yes Signs or symptoms of abuse/neglect since last No Secondary Verification Process Yes visito Completed: Hospitalized since last visit: No Patient Requires Transmission- No Implantable device outside of the clinic excluding No Based Precautions: cellular tissue based products placed in the center Patient Has Alerts: Yes since last visit: Patient Alerts: R ABI = 1.12, L Has Dressing in Place as Prescribed: Yes ABI = 1.1 Pain Present Now: Yes Electronic Signature(s) Signed: 10/07/2019 3:04:24 PM By: [name] Entered By: [name] on 07/04/2019 16:04:44 -------------------------------------------------------------------------------- Encounter Discharge Information Details Patient Name: Date of Service: [name] 07/04/2019 3:30 PM Medical Record [id number] Patient Account Number: [id number] Date of Birth/Sex: Treating RN: [birth year] (63 y.o. [name] Primary Care Blaike Newburn: Carollee Herter, Kendrick Fries Other Clinician: Referring Seidy Labreck: Treating Shmiel Morton/Extender:Robson, G[place] Folk in Treatment: 38 Encounter Discharge Information  Items Post Procedure Vitals Discharge Condition: Stable Temperature (F): 99.1 Ambulatory Status: Wheelchair Pulse (bpm): 74 Discharge Destination: Home Respiratory Rate (breaths/min): 18 Transportation: Private Auto Blood Pressure (mmHg): 138/40 Accompanied By: self Schedule Follow-up Appointment: Yes Clinical Summary of Care: Electronic Signature(s) Signed: 07/04/2019 6:15:35 PM By: Deon Pilling Entered By: Deon Pilling on 07/04/2019 18:10:51 -------------------------------------------------------------------------------- Lower Extremity Assessment Details Patient Name: Date of Service: Bentlee, Drier 07/04/2019 3:30 PM Medical Record [id number] Patient Account Number: [id number] Date of Birth/Sex: Treating RN: [birth year] (63 y.o. [name] Primary Care Tahlia Deamer: Carollee Herter, Kendrick Fries Other Clinician: Referring Kimo Bancroft: Treating Errica Dutil/Extender:Robson, Tennis Ship, Tora Kindred in Treatment: 33 Edema Assessment Assessed: [Left: No] [Right: No] Edema: [Left: Ye] [Right: s] Calf Left: Right: Point of Measurement: 28 cm From Medial Instep 36.2 cm cm Ankle Left: Right: Point of Measurement: 10 cm From Medial Instep 22.6 cm cm Vascular Assessment Pulses: Dorsalis Pedis Palpable: [Left:Yes] Electronic Signature(s) Signed: 07/09/2019 6:55:12 PM By: [name] RN, BSN Entered By: [name] on 07/04/2019 16:31:48 -------------------------------------------------------------------------------- Multi Wound Chart Details Patient Name: Date of Service: [name] 07/04/2019 3:30 PM Medical Record [id number] Patient Account Number: [id number] Date of Birth/Sex: Treating RN: Jun 04, 1956 (63 y.o. 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] [6:Lower Extremity] Comorbid History: [3:Cataracts, Anemia, Coronary Artery Disease, Coronary Artery Disease, Hypertension, Myocardial Hypertension, Myocardial Infarction, Type II Diabetes, Infarction, Type II Diabetes, Osteoarthritis, Osteomyelitis, Neuropathy  Osteomyelitis, Neuropathy] [6:Cataracts, Anemia, Osteoarthritis,] [N/A:N/A] Date Acquired: [3:11/12/2016] [6:06/19/2019] [N/A:N/A] Weeks of Treatment: [3:33] [6:2] [N/A:N/A] Wound Status: [3:Open] [6:Open] [N/A:N/A] Measurements L x W x D 2.5x2.2x0.8 [6:0.3x0.3x0.2] [N/A:N/A] (cm)  Area (cm) : [3:4.32] [6:0.071] [N/A:N/A] Volume (cm) : [3:3.456] [6:0.014] [N/A:N/A] % Reduction in Area: [3:19.30%] [6:-787.50%] [N/A:N/A] % Reduction in Volume: 46.20% [6:-1300.00%] [N/A:N/A] Classification: [3:Grade 2] [6:Grade 2] [N/A:N/A] Exudate Amount: [3:Medium] [6:Small] [N/A:N/A] Exudate Type: [3:Serosanguineous] [6:Serosanguineous] [N/A:N/A] Exudate Color: [3:red, brown] [6:red, brown] [N/A:N/A] Wound Margin: [3:Flat and Intact] [6:Flat and Intact] [N/A:N/A] Granulation Amount: [3:Medium (34-66%)] [6:Large (67-100%)] [N/A:N/A] Granulation Quality: [3:Red, Pale] [6:Pink] [N/A:N/A] Necrotic Amount: [3:Medium (34-66%)] [6:None Present (0%)] [N/A:N/A] Exposed Structures: [3:Fat Layer (Subcutaneous Fat Layer (Subcutaneous N/A Tissue) Exposed: Yes Fascia: No Tendon: No Muscle: No Joint: No Bone: No] [6:Tissue) Exposed: Yes Fascia: No Tendon: No Muscle: No Joint: No Bone: No] Epithelialization: [3:None] [6:Small (1-33%)] [N/A:N/A] Debridement: [3:Debridement - Selective/Open  Wound] [6:Debridement - Selective/Open Wound] [N/A:N/A] Pre-procedure [3:16:30] [6:16:30] [N/A:N/A] Verification/Time Out Taken: Pain Control: [3:Lidocaine 4% Topical Solution] [6:Lidocaine 4% Topical Solution] [N/A:N/A] Tissue Debrided: [3:Callus] [6:Callus] [N/A:N/A] Level: [3:Skin/Epidermis] [6:Skin/Epidermis] [N/A:N/A] Debridement Area (sq cm):5.5 [6:0.09] [N/A:N/A] Instrument: [3:Blade] [6:Blade] [N/A:N/A] Bleeding: [3:Minimum] [6:Minimum] [N/A:N/A] Hemostasis Achieved: [3:Pressure] [6:Pressure] [N/A:N/A] Procedural Pain: [3:0] [6:0] [N/A:N/A] Post Procedural Pain: [3:0] [6:0] [N/A:N/A] Debridement Treatment [3:Procedure was tolerated] [6:Procedure was tolerated] [N/A:N/A] Response: [3:well] [6:well] Post Debridement [3:2.5x2.2x0.8] [6:0.3x0.3x0.2] [N/A:N/A] Measurements L x W x D (cm) Post Debridement [3:3.456] [1:7.616] [N/A:N/A] Volume: (cm) Procedures Performed: [3:Debridement] [6:Debridement] [N/A:N/A] Treatment Notes Electronic Signature(s) Signed: 07/04/2019 6:28:02 PM By: Linton Ham MD Signed: 07/09/2019 6:55:12 PM By: [name] RN, BSN Entered By: Linton Ham on 07/04/2019 16:46:51 -------------------------------------------------------------------------------- Multi-Disciplinary Care Plan Details Patient Name: Date of Service: [name]. 07/04/2019 3:30 PM Medical Record [id number] Patient Account Number: [id number] Date of Birth/Sex: Treating RN: 1956/07/23 (63 y.o. [name] Primary Care Brynnly Bonet: [name] Other Clinician: Referring Shital Crayton: Treating Sakira Dahmer/Extender:Robson, Grafton Folk in Treatment: 72 Active Inactive Abuse / Safety / Falls / Self Care Management Nursing Diagnoses: Potential for injury related to falls Goals: Patient/caregiver will verbalize understanding of skin care regimen Date Initiated: 11/12/2018 Target Resolution Date: 07/11/2019 Goal Status:  Active Interventions: Assess Activities of Daily Living upon admission and as needed Assess fall risk on admission and as needed Assess: immobility, friction, shearing, incontinence upon admission and as needed Assess impairment of mobility on admission and as needed per policy Assess personal safety and home safety (as indicated) on admission and as needed Assess self care needs on admission and as needed Notes: Electronic Signature(s) Signed: 07/09/2019 6:55:12 PM By: [name] RN, BSN Entered By: [name] on 07/04/2019 18:29:22 -------------------------------------------------------------------------------- Pain Assessment Details Patient Name: Date of Service: [name] 07/04/2019 3:30 PM Medical Record [id number] Patient Account Number: [id number] Date of Birth/Sex: Treating RN: November 07, 1955 (63 y.o. [name] Primary Care Pesach Frisch: [name] Other Clinician: Referring Delayna Sparlin: Treating Nikira Kushnir/Extender:Robson, G[place] Folk in Treatment: 33 Active Problems Location of Pain Severity and Description of Pain Patient Has Paino Yes Site Locations Rate the pain. Current Pain Level: 6 Pain Management and Medication Current Pain Management: Electronic Signature(s) Signed: 07/09/2019 6:55:12 PM By: [name] RN, BSN Signed: 10/07/2019 3:04:24 PM By: [name] Entered By: [name] on 07/04/2019 16:05:10 -------------------------------------------------------------------------------- Patient/Caregiver Education Details Patient Name: Date of Service: [name] 9/4/2020andnbsp3:30 PM Medical Record [phone] Patient Account Number: [id number] Date of Birth/Gender: Treating RN: Jan 07, 1956 (63 y.o. [name] Primary Care Physician: [name] Other Clinician: Referring Physician: [name] Treating Physician/Extender:Robson, Esperanza Richters in Treatment: 65 Education  Assessment Education Provided To: Patient Education Topics Provided Wound/Skin Impairment: Methods: Explain/Verbal Responses:  State content correctly Electronic Signature(s) Signed: 07/09/2019 6:55:12 PM By: [name] RN, BSN Entered By: [name] on 07/04/2019 18:29:37 -------------------------------------------------------------------------------- Wound Assessment Details Patient Name: Date of Service: [name] 07/04/2019 3:30 PM Medical Record [id number] Patient Account Number: [id number] Date of Birth/Sex: Treating RN: 1956-02-20 (63 y.o. [name] Primary Care Layla Kesling: Carollee Herter, Kendrick Fries Other Clinician: Referring Rocio Wolak: Treating Permelia Bamba/Extender:Robson, Tennis Ship, Tora Kindred in Treatment: 33 Wound Status Wound Number: 3 Primary Diabetic Wound/Ulcer of the Lower Extremity Etiology: Wound Location: Left Calcaneus Wound Open Wounding Event: Trauma Status: Date Acquired: 11/12/2016 Comorbid Cataracts, Anemia, Coronary Artery Disease, Weeks Of Treatment: 33 History: Hypertension, Myocardial Infarction, Type II Clustered Wound: No Diabetes, Osteoarthritis, Osteomyelitis, Neuropathy Photos Wound Measurements Length: (cm) 2.5 Width: (cm) 2.2 Depth: (cm) 0.8 Area: (cm) 4.32 Volume: (cm) 3.456 Wound Description Classification: Grade 2 Wound Margin: Flat and Intact Exudate Amount: Medium Exudate Type: Serosanguineous Exudate Color: red, brown Wound Bed Granulation Amount: Medium (34-66%) Granulation Quality: Red, Pale Necrotic Amount: Medium (34-66%) Necrotic Quality: Adherent Slough After Cleansing: No rino Yes Exposed Structure sed: No Subcutaneous Tissue) Exposed: Yes sed: No sed: No ed: No d: No % Reduction in Area: 19.3% % Reduction in Volume: 46.2% Epithelialization: None Tunneling: No Undermining: No Foul Odor Slough/Fib Fascia Expo Fat Layer ( Tendon Expo Muscle Expo Joint Expos Bone  Expose Electronic Signature(s) Signed: 07/09/2019 6:55:12 PM By: [name] RN, BSN Signed: 07/10/2019 10:02:41 AM By: Mikeal Hawthorne EMT/HBOT Entered By: Mikeal Hawthorne on 07/09/2019 14:29:36 -------------------------------------------------------------------------------- Wound Assessment Details Patient Name: Date of Service: [name] 07/04/2019 3:30 PM Medical Record [id number] Patient Account Number: [id number] Date of Birth/Sex: Treating RN: 03/27/1956 (63 y.o. [name] Primary Care Rathana Viveros: Carollee Herter, Kendrick Fries Other Clinician: Referring Raina Sole: Treating Tahnee Cifuentes/Extender:Robson, Tennis Ship, Tora Kindred in Treatment: 33 Wound Status Wound Number: 6 Primary Diabetic Wound/Ulcer of the Lower Extremity Etiology: Wound Location: Left Toe Great Wound Open Wounding Event: Blister Status: Date Acquired: 06/19/2019 Comorbid Cataracts, Anemia, Coronary Artery Disease, Weeks Of Treatment: 2 History: Hypertension, Myocardial Infarction, Type II Clustered Wound: No Diabetes, Osteoarthritis, Osteomyelitis, Neuropathy Photos Wound Measurements Length: (cm) 0.3 Width: (cm) 0.3 Depth: (cm) 0.2 Area: (cm) 0.071 Volume: (cm) 0.014 Wound Description Classification: Grade 2 Wound Margin: Flat and Intact Exudate Amount: Small Exudate Type: Serosanguineous Exudate Color: red, brown Wound Bed Granulation Amount: Large (67-100%) Granulation Quality: Pink Necrotic Amount: None Present (0%) fter Cleansing: No ino Yes Exposed Structure ed: No ubcutaneous Tissue) Exposed: Yes ed: No ed: No d: No : No % Reduction in Area: -787.5% % Reduction in Volume: -1300% Epithelialization: Small (1-33%) Tunneling: No Undermining: No Foul Odor A Slough/Fibr Fascia Expos Fat Layer (S Tendon Expos Muscle Expos Joint Expose Bone Exposed Electronic Signature(s) Signed: 07/09/2019 6:55:12 PM By: [name] RN, BSN Signed: 07/10/2019 10:02:41 AM By:  Mikeal Hawthorne EMT/HBOT Entered By: Mikeal Hawthorne on 07/09/2019 14:28:29 -------------------------------------------------------------------------------- Vitals Details Patient Name: Date of Service: [name]. 07/04/2019 3:30 PM Medical Record [id number] Patient Account Number: [id number] Date of Birth/Sex: Treating RN: [birth year] (63 y.o. [name] Primary Care Ulis Kaps: Carollee Herter, Kendrick Fries Other Clinician: Referring Kaylani Fromme: Treating Amely Voorheis/Extender:Robson, Tennis Ship, Tora Kindred in Treatment: 64 Vital Signs Time Taken: 16:04 Temperature (F): 99.1 Height (in): 62 Pulse (bpm): 74 Weight (lbs): 180 Respiratory Rate (breaths/min): 18 Body Mass Index (BMI): 32.9 Blood Pressure (mmHg): 138/40 Reference Range: 80 - 120 mg / dl Electronic Signature(s) Signed: 10/07/2019 3:04:24 PM By: [name] Entered By: [name] on  07/04/2019 16:05:02 

## 2019-10-07 NOTE — Progress Notes (Signed)
[name] [name], [name] [name] ([id number]) Visit Report for 08/15/2019 Arrival Information Details Patient Name: Date of Service: [name] [name], [name] [name] 08/15/2019 3:15 PM Medical Record [id number] Patient Account Number: [id number] Date of Birth/Sex: Treating RN: 1955-11-20 (63 y.o. Orvan Falconer Primary Care Delaila Nand: Carollee Herter, Kendrick Fries Other Clinician: Referring Gaberial Cada: Treating Kostantinos Tallman/Extender:Robson, Graft[place] Folk in Treatment: 70 Visit Information History Since Last Visit All ordered tests and consults were completed: No Patient Arrived: Knee Scooter Added or deleted any medications: No Arrival Time: 15:27 Any new allergies or adverse reactions: No Accompanied By: self Had a fall or experienced change in No Transfer Assistance: None activities of daily living that may affect Patient Identification Verified: Yes risk of falls: Secondary Verification Process Yes Signs or symptoms of abuse/neglect since last No Completed: visito Patient Requires Transmission- No Hospitalized since last visit: No Based Precautions: Implantable device outside of the clinic excluding No Patient Has Alerts: Yes cellular tissue based products placed in the center Patient Alerts: R ABI = 1.12, L since last visit: ABI = 1.1 Has Dressing in Place as Prescribed: Yes Pain Present Now: No Electronic Signature(s) Signed: 10/07/2019 3:01:15 PM By: Carlene Coria RN Entered By: Carlene Coria on 08/15/2019 15:36:10 -------------------------------------------------------------------------------- Clinic Level of Care Assessment Details Patient Name: Date of Service: [name] [name], [name] [name] 08/15/2019 3:15 PM Medical Record [id number] Patient Account Number: [id number] Date of Birth/Sex: Treating RN: 31-Dec-1955 (63 y.o. [name] Primary Care Eron Goble: Carollee Herter, Kendrick Fries Other Clinician: Referring Yara Tomkinson: Treating Kaitland Lewellyn/Extender:Robson, G[place]on Folk in  Treatment: 39 Clinic Level of Care Assessment Items TOOL 4 Quantity Score X - Use when only an EandM is performed on FOLLOW-UP visit 1 0 ASSESSMENTS - Nursing Assessment / Reassessment X - Reassessment of Co-morbidities (includes updates in patient status) 1 10 X - Reassessment of Adherence to Treatment Plan 1 5 ASSESSMENTS - Wound and Skin Assessment / Reassessment []  - Simple Wound Assessment / Reassessment - one wound 0 X - Complex Wound Assessment / Reassessment - multiple wounds 2 5 []  - Dermatologic / Skin Assessment (not related to wound area) 0 ASSESSMENTS - Focused Assessment []  - Circumferential Edema Measurements - multi extremities 0 []  - Nutritional Assessment / Counseling / Intervention 0 X - Lower Extremity Assessment (monofilament, tuning fork, pulses) 1 5 []  - Peripheral Arterial Disease Assessment (using hand held doppler) 0 ASSESSMENTS - Ostomy and/or Continence Assessment and Care []  - Incontinence Assessment and Management 0 []  - Ostomy Care Assessment and Management (repouching, etc.) 0 PROCESS - Coordination of Care X - Simple Patient / Family Education for ongoing care 1 15 []  - Complex (extensive) Patient / Family Education for ongoing care 0 X - Staff obtains Programmer, systems, Records, Test Results / Process Orders 1 10 []  - Staff telephones HHA, Nursing Homes / Clarify orders / etc 0 []  - Routine Transfer to another Facility (non-emergent condition) 0 []  - Routine Hospital Admission (non-emergent condition) 0 []  - New Admissions / Biomedical engineer / Ordering NPWT, Apligraf, etc. 0 []  - Emergency Hospital Admission (emergent condition) 0 X - Simple Discharge Coordination 1 10 []  - Complex (extensive) Discharge Coordination 0 PROCESS - Special Needs []  - Pediatric / Minor Patient Management 0 []  - Isolation Patient Management 0 []  - Hearing / Language / Visual special needs 0 []  - Assessment of Community assistance (transportation, D/C planning, etc.) 0 []   - Additional assistance / Altered mentation 0 []  - Support Surface(s) Assessment (bed, cushion, seat, etc.) 0 INTERVENTIONS - Wound Cleansing /  Measurement []  - Simple Wound Cleansing - one wound 0 X - Complex Wound Cleansing - multiple wounds 2 5 X - Wound Imaging (photographs - any number of wounds) 1 5 []  - Wound Tracing (instead of photographs) 0 []  - Simple Wound Measurement - one wound 0 X - Complex Wound Measurement - multiple wounds 2 5 INTERVENTIONS - Wound Dressings []  - Small Wound Dressing one or multiple wounds 0 X - Medium Wound Dressing one or multiple wounds 1 15 []  - Large Wound Dressing one or multiple wounds 0 X - Application of Medications - topical 1 5 []  - Application of Medications - injection 0 INTERVENTIONS - Miscellaneous []  - External ear exam 0 []  - Specimen Collection (cultures, biopsies, blood, body fluids, etc.) 0 []  - Specimen(s) / Culture(s) sent or taken to Lab for analysis 0 []  - Patient Transfer (multiple staff / Civil Service fast streamer / Similar devices) 0 []  - Simple Staple / Suture removal (25 or less) 0 []  - Complex Staple / Suture removal (26 or more) 0 []  - Hypo / Hyperglycemic Management (close monitor of Blood Glucose) 0 []  - Ankle / Brachial Index (ABI) - do not check if billed separately 0 X - Vital Signs 1 5 Has the patient been seen at the hospital within the last three years: Yes Total Score: 115 Level Of Care: New/Established - Level 3 Electronic Signature(s) Signed: 08/15/2019 6:00:55 PM By: [name] RN, BSN Entered By: [name] on 08/15/2019 17:58:15 -------------------------------------------------------------------------------- Encounter Discharge Information Details Patient Name: Date of Service: [name] [name] 08/15/2019 3:15 PM Medical Record [id number] Patient Account Number: [id number] Date of Birth/Sex: Treating RN: [birth year] (63 y.o. Orvan Falconer Primary Care Bich Mchaney: [name] Other  Clinician: Referring Winnifred Dufford: Treating Laramie Meissner/Extender:Robson, Grafton Folk in Treatment: 28 Encounter Discharge Information Items Discharge Condition: Stable Discharge Destination: Home Transportation: Private Auto Accompanied By: self Schedule Follow-up Appointment: Yes Clinical Summary of Care: Notes knee scooter Electronic Signature(s) Signed: 10/07/2019 3:01:15 PM By: Carlene Coria RN Entered By: Carlene Coria on 08/15/2019 16:48:46 -------------------------------------------------------------------------------- Lower Extremity Assessment Details Patient Name: Date of Service: [name] [name], [name] [name] 08/15/2019 3:15 PM Medical Record [id number] Patient Account Number: [id number] Date of Birth/Sex: Treating RN: 01/30/1956 (63 y.o. Orvan Falconer Primary Care Itzayana Pardy: Carollee Herter, Kendrick Fries Other Clinician: Referring Savoy Somerville: Treating Trinten Boudoin/Extender:Robson, Lennie Odor CHASE, Tora Kindred in Treatment: 39 Edema Assessment Assessed: [Left: No] [Right: No] Edema: [Left: N] [Right: o] Calf Left: Right: Point of Measurement: 28 cm From Medial Instep 37 cm cm Ankle Left: Right: Point of Measurement: 10 cm From Medial Instep 23 cm cm Electronic Signature(s) Signed: 10/07/2019 3:01:15 PM By: Carlene Coria RN Entered By: Carlene Coria on 08/15/2019 15:37:03 -------------------------------------------------------------------------------- Multi Wound Chart Details Patient Name: Date of Service: [name] [name] 08/15/2019 3:15 PM Medical Record [id number] Patient Account Number: [id number] Date of Birth/Sex: Treating RN: 06-Mar-1956 (63 y.o. 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] [6:Lower Extremity] Comorbid History: [3:Cataracts, Anemia, Coronary Artery Disease, Coronary Artery Disease, Hypertension, Myocardial Hypertension, Myocardial Infarction, Type II Diabetes, Infarction, Type II Diabetes, Osteoarthritis, Osteomyelitis, Neuropathy  Osteomyelitis,  Neuropathy] [6:Cataracts, Anemia, Osteoarthritis,] [N/A:N/A] Date Acquired: [3:11/12/2016] [6:06/19/2019] [N/A:N/A] Weeks of Treatment: [3:39] [6:8] [N/A:N/A] Wound Status: [3:Open] [6:Open] [N/A:N/A] Measurements L x W x D 2.6x2.5x1.2 [6:0.3x0.2x0.2] [N/A:N/A] (cm) Area (cm) : [3:5.105] [6:0.047] [N/A:N/A] Volume (cm) : [3:6.126] [6:0.009] [N/A:N/A] % Reduction in Area: [3:4.70%] [6:-487.50%] [N/A:N/A] % Reduction in Volume: 4.70% [6:-800.00%] [N/A:N/A] Classification: [3:Grade 2] [6:Grade 2] [N/A:N/A] Exudate Amount: [3:Medium] [6:Small] [N/A:N/A] Exudate Type: [3:Serosanguineous] [6:Serosanguineous] [N/A:N/A] Exudate Color: [3:red, brown] [6:red, brown] [N/A:N/A] Wound Margin: [3:Flat and Intact] [6:Flat and Intact] [N/A:N/A] Granulation Amount: [3:Medium (34-66%)] [6:Large (67-100%)] [N/A:N/A] Granulation Quality: [3:Pink] [6:Pink] [N/A:N/A] Necrotic Amount: [3:Medium (34-66%)] [6:None Present (0%)] [N/A:N/A] Exposed Structures: [3:Fat Layer (Subcutaneous Fat Layer (Subcutaneous N/A Tissue) Exposed: Yes Fascia: No Tendon: No Muscle: No Joint: No Bone: No None] [6:Tissue) Exposed: Yes Fascia: No Tendon: No Muscle: No Joint: No Bone: No Small (1-33%)] Treatment Notes Wound #3 (Left Calcaneus) 1. Cleanse With Wound Cleanser 3. Primary Dressing Applied Collagen 4. Secondary Dressing Dry  Gauze Roll Gauze 5. Secured With Tape Notes felt to calcanus, netting Wound #6 (Left Toe Great) 1. Cleanse With Wound Cleanser 3. Primary Dressing Applied Collagen 4. Secondary Dressing Dry Gauze Roll Gauze 5. Secured With Tape Notes felt to calcanus, Horticulturist, commercial) Signed: 08/15/2019 5:33:39 PM By: Linton Ham MD Signed: 08/15/2019 6:00:55 PM By: [name] RN, BSN Entered By: Linton Ham on 08/15/2019 17:21:14 -------------------------------------------------------------------------------- Multi-Disciplinary Care Plan Details Patient Name: Date of Service: [name] [name], [name] [name] 08/15/2019 3:15 PM Medical Record [id number] Patient Account Number: [id number] Date of Birth/Sex: Treating RN: [birth year] (63 y.o. [name] Primary Care Asal Teas: Carollee Herter, Kendrick Fries Other Clinician: Referring Juanelle Trueheart: Treating Leeana Creer/Extender:Robson, Grafton Folk in Treatment: 53 Active Inactive Abuse / Safety / Falls / Self Care Management Nursing Diagnoses: Potential for injury related to falls Goals: Patient/caregiver will verbalize understanding of skin care regimen Date Initiated: 11/12/2018 Target Resolution Date: 08/29/2019 Goal Status: Active Interventions: Assess Activities of Daily Living upon admission and as needed Assess fall risk on admission and as needed Assess: immobility, friction, shearing, incontinence upon admission and as needed Assess impairment of mobility on admission and as needed per policy Assess personal safety and home safety (as indicated) on admission and as needed Assess self care needs on admission and as needed Notes: Wound/Skin Impairment Nursing Diagnoses: Knowledge deficit related to ulceration/compromised skin integrity Goals: Patient/caregiver will verbalize understanding of skin care regimen Date Initiated: 04/11/2019 Target Resolution Date: 08/29/2019 Goal Status:  Active Ulcer/skin breakdown will have a volume reduction of 30% by week 4 Date Initiated: 11/12/2018 Date Inactivated: 01/17/2019 Target Resolution Date: 01/11/2019 Goal Status: Met Ulcer/skin breakdown will have a volume reduction of 50% by week 8 Date Initiated: 01/17/2019 Date Inactivated: 02/14/2019 Target Resolution Date: 02/14/2019 Goal Status: Met Ulcer/skin breakdown will have a volume reduction of 80% by week 12 Date Initiated: 02/14/2019 Date Inactivated: 03/21/2019 Target Resolution Date: 03/14/2019 Unmet Reason: infection in Goal Status: Unmet wound Interventions: Assess patient/caregiver ability to obtain necessary supplies Assess patient/caregiver ability to perform ulcer/skin care regimen upon admission and as needed Assess ulceration(s) every visit Notes: Electronic Signature(s) Signed: 08/15/2019 6:00:55 PM By: [name] RN, BSN Entered By: [name] on 08/15/2019 17:57:26 -------------------------------------------------------------------------------- Pain Assessment Details Patient Name: Date of Service: [name] [name] 08/15/2019 3:15 PM Medical Record [id number] Patient Account Number: [id number] Date of Birth/Sex: Treating RN: 17-Sep-1956 (63 y.o. Orvan Falconer Primary Care Quina Wilbourne: [name] Other Clinician: Referring Lezley Bedgood: Treating Rivka Baune/Extender:Robson, Lennie Odor CHASE, YVONNE Weeks in Treatment: 50  Active Problems Location of Pain Severity and Description of Pain Patient Has Paino No Site Locations Pain Management and Medication Current Pain Management: Electronic Signature(s) Signed: 10/07/2019 3:01:15 PM By: Carlene Coria RN Entered By: Carlene Coria on 08/15/2019 15:36:51 -------------------------------------------------------------------------------- Patient/Caregiver Education Details Patient Name: [name] [name] 10/16/2020andnbsp3:15 Date of Service: PM Medical Record [id number] Number: Patient Account  Number: [id number] Date of Birth/Gender: 1955-12-12 (63 y.o. F) Treating RN: [name] Other Clinician: Carollee Herter, Primary Care Physician: Antionette Poles Physician/Extender: Carollee Herter, Referring Physician: Tora Kindred in Treatment: 15 Education Assessment Education Provided To: Patient Education Topics Provided Wound/Skin Impairment: Methods: Explain/Verbal Responses: State content correctly Electronic Signature(s) Signed: 08/15/2019 6:00:55 PM By: [name] RN, BSN Entered By: [name] on 08/15/2019 17:57:36 -------------------------------------------------------------------------------- Wound Assessment Details Patient Name: Date of Service: [name] [name], [name] [name] 08/15/2019 3:15 PM Medical Record [id number] Patient Account Number: [id number] Date of Birth/Sex: Treating RN: 06/04/1956 (63 y.o. Orvan Falconer Primary Care Dakai Braithwaite: Carollee Herter, Kendrick Fries Other Clinician: Referring Sulaiman Imbert: Treating Xzander Gilham/Extender:Robson, Tennis Ship, Tora Kindred in Treatment: 39 Wound Status Wound Number: 3 Primary Diabetic Wound/Ulcer of the Lower Extremity Etiology: Wound Location: Left Calcaneus Wound Open Wounding Event: Trauma Status: Date Acquired: 11/12/2016 Comorbid Cataracts, Anemia, Coronary Artery Disease, Weeks Of Treatment: 39 History: Hypertension, Myocardial Infarction, Type II Clustered Wound: No Diabetes, Osteoarthritis, Osteomyelitis, Neuropathy Photos Wound Measurements Length: (cm) 2.6 Width: (cm) 2.5 Depth: (cm) 1.2 Area: (cm) 5.105 Volume: (cm) 6.126 Wound Description Classification: Grade 2 Wound Margin: Flat and Intact Exudate Amount: Medium Exudate Type: Serosanguineous Exudate Color: red, brown Wound Bed Granulation Amount: Medium (34-66%) Granulation Quality: Pink Necrotic Amount: Medium (34-66%) Necrotic Quality: Adherent Slough After Cleansing: No rino Yes Exposed Structure sed:  No Subcutaneous Tissue) Exposed: Yes sed: No sed: No ed: No d: No % Reduction in Area: 4.7% % Reduction in Volume: 4.7% Epithelialization: None Tunneling: No Undermining: No Foul Odor Slough/Fib Fascia Expo Fat Layer ( Tendon Expo Muscle Expo Joint Expos Bone Expose Electronic Signature(s) Signed: 08/19/2019 1:32:19 PM By: Mikeal Hawthorne EMT/HBOT Signed: 10/07/2019 3:01:15 PM By: Carlene Coria RN Entered By: Mikeal Hawthorne on 08/19/2019 11:13:04 -------------------------------------------------------------------------------- Wound Assessment Details Patient Name: Date of Service: [name] [name] 08/15/2019 3:15 PM Medical Record [id number] Patient Account Number: [id number] Date of Birth/Sex: Treating RN: 12-21-1955 (63 y.o. Orvan Falconer Primary Care Maral Lampe: Carollee Herter, Kendrick Fries Other Clinician: Referring Kerstyn Coryell: Treating Rudine Rieger/Extender:Robson, Tennis Ship, Tora Kindred in Treatment: 39 Wound Status Wound Number: 6 Primary Diabetic Wound/Ulcer of the Lower Extremity Etiology: Wound Location: Left Toe Great Wound Open Wounding Event: Blister Status: Date Acquired: 06/19/2019 Comorbid Cataracts, Anemia, Coronary Artery Disease, Weeks Of Treatment: 8 History: Hypertension, Myocardial Infarction, Type II Clustered Wound: No Diabetes, Osteoarthritis, Osteomyelitis, Neuropathy Photos Wound Measurements Length: (cm) 0.3 Width: (cm) 0.2 Depth: (cm) 0.2 Area: (cm) 0.047 Volume: (cm) 0.009 Wound Description Classification: Grade 2 Wound Margin: Flat and Intact Exudate Amount: Small Exudate Type: Serosanguineous Exudate Color: red, brown Wound Bed Granulation Amount: Large (67-100%) Granulation Quality: Pink Necrotic Amount: None Present (0%) After Cleansing: No rino No Exposed Structure sed: No Subcutaneous Tissue) Exposed: Yes sed: No sed: No ed: No d: No % Reduction in Area: -487.5% % Reduction in Volume:  -800% Epithelialization: Small (1-33%) Tunneling: No Undermining: No Foul Odor Slough/Fib Fascia Expo Fat Layer ( Tendon Expo Muscle Expo Joint Expos Bone Expose Electronic Signature(s) Signed: 08/19/2019 1:32:19 PM By: Mikeal Hawthorne EMT/HBOT Signed: 10/07/2019 3:01:15 PM By: Carlene Coria RN Entered By: Mikeal Hawthorne on 08/19/2019  11:12:41 -------------------------------------------------------------------------------- Vitals Details Patient Name: Date of Service: [name] [name], [name] [name] 08/15/2019 3:15 PM Medical Record [id number] Patient Account Number: [id number] Date of Birth/Sex: Treating RN: Feb 14, 1956 (63 y.o. Orvan Falconer Primary Care Kahealani Yankovich: Carollee Herter, Kendrick Fries Other Clinician: Referring Debra Colon: Treating Yussuf Sawyers/Extender:Robson, Tennis Ship, Tora Kindred in Treatment: 39 Vital Signs Time Taken: 15:36 Temperature (F): 98.4 Height (in): 62 Pulse (bpm): 81 Weight (lbs): 180 Respiratory Rate (breaths/min): 18 Body Mass Index (BMI): 32.9 Blood Pressure (mmHg): 147/75 Reference Range: 80 - 120 mg / dl Electronic Signature(s) Signed: 10/07/2019 3:01:15 PM By: Carlene Coria RN Entered By: Carlene Coria on 08/15/2019 15:36:40

## 2019-10-07 NOTE — Progress Notes (Signed)
[name], [name] ([id number]) Visit Report for 08/22/2019 Arrival Information Details Patient Name: Date of Service: [name], [name] 08/22/2019 4:00 PM Medical Record [id number] Patient Account Number: [id number] Date of Birth/Sex: Treating RN: 09/23/1956 (63 y.o. [name] Primary Care Rudransh Bellanca: [name] Other Clinician: Referring Rikita Grabert: Treating Radwan Cowley/Extender:Robson, [place] Folk in Treatment: 44 Visit Information History Since Last Visit Added or deleted any medications: No Patient Arrived: Other Any new allergies or adverse reactions: No Arrival Time: 16:58 Had a fall or experienced change in No Accompanied By: self activities of daily living that may affect Transfer Assistance: None risk of falls: Patient Identification Verified: Yes Signs or symptoms of abuse/neglect since last No Secondary Verification Process Yes visito Completed: Hospitalized since last visit: No Patient Requires Transmission- No Implantable device outside of the clinic excluding No Based Precautions: cellular tissue based products placed in the center Patient Has Alerts: Yes since last visit: Patient Alerts: R ABI = 1.12, L Has Dressing in Place as Prescribed: Yes ABI = 1.1 Pain Present Now: Yes Electronic Signature(s) Signed: 10/07/2019 3:02:15 PM By: [name] Entered By: [name] on 08/22/2019 16:59:38 -------------------------------------------------------------------------------- Encounter Discharge Information Details Patient Name: Date of Service: [name] [name]. 08/22/2019 4:00 PM Medical Record [id number] Patient Account Number: [id number] Date of Birth/Sex: Treating RN: Feb 01, 1956 (63 y.o. Clearnce Sorrel Primary Care Syncere Eble: [name] Other Clinician: Referring Phebe Dettmer: Treating Tomasita Beevers/Extender:Robson, [place] Folk in Treatment: 27 Encounter Discharge Information  Items Post Procedure Vitals Discharge Condition: Stable Temperature (F): 98.5 Ambulatory Status: Ambulatory Pulse (bpm): 73 Discharge Destination: Home Respiratory Rate (breaths/min): 18 Transportation: Private Auto Blood Pressure (mmHg): 159/76 Accompanied By: self Schedule Follow-up Appointment: Yes Clinical Summary of Care: Patient Declined Electronic Signature(s) Signed: 08/25/2019 5:21:50 PM By: Kela Millin Entered By: Kela Millin on 08/22/2019 18:12:20 -------------------------------------------------------------------------------- Lower Extremity Assessment Details Patient Name: Date of Service: [name], [name] 08/22/2019 4:00 PM Medical Record [id number] Patient Account Number: [id number] Date of Birth/Sex: Treating RN: [birth year] (63 y.o. Orvan Falconer Primary Care Anvitha Hutmacher: Carollee Herter, Kendrick Fries Other Clinician: Referring Jezebel Pollet: Treating Viraat Vanpatten/Extender:Robson, Lennie Odor CHASE, Tora Kindred in Treatment: 40 Edema Assessment Assessed: [Left: No] [Right: No] Edema: [Left: N] [Right: o] Calf Left: Right: Point of Measurement: 28 cm From Medial Instep 36 cm cm Ankle Left: Right: Point of Measurement: 10 cm From Medial Instep 23 cm cm Electronic Signature(s) Signed: 10/07/2019 3:01:15 PM By: Carlene Coria RN Entered By: Carlene Coria on 08/22/2019 17:03:11 -------------------------------------------------------------------------------- Multi Wound Chart Details Patient Name: Date of Service: [name] [name]. 08/22/2019 4:00 PM Medical Record [id number] Patient Account Number: [id number] Date of Birth/Sex: Treating RN: 1956/08/09 (63 y.o. 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] [6:Lower Extremity] Comorbid History: [3:Cataracts, Anemia, Coronary Artery Disease, Coronary Artery Disease, Hypertension, Myocardial Hypertension, Myocardial Infarction, Type II Diabetes, Infarction, Type II Diabetes, Osteoarthritis, Osteomyelitis, Neuropathy  Osteomyelitis, Neuropathy] [6:Cataracts, Anemia, Osteoarthritis,] [N/A:N/A] Date Acquired: [3:11/12/2016] [6:06/19/2019] [N/A:N/A] Weeks of Treatment: [3:40] [6:9] [N/A:N/A] Wound Status: [3:Open] [6:Open] [N/A:N/A] Measurements L x W x D 2.5x2x0.7 [6:0.2x0.2x0.2] [N/A:N/A] (cm) Area (cm) : [3:3.927] [6:0.031] [N/A:N/A] Volume (  cm) : [3:2.749] [6:0.006] [N/A:N/A] % Reduction in Area: [3:26.70%] [6:-287.50%] [N/A:N/A] % Reduction in Volume: 57.20% [6:-500.00%] [N/A:N/A] Classification: [3:Grade 2] [6:Grade 2] [N/A:N/A] Exudate Amount: [3:Medium] [6:Small] [N/A:N/A] Exudate Type: [3:Serosanguineous] [6:Serosanguineous] [N/A:N/A] Exudate Color: [3:red, brown] [6:red, brown] [N/A:N/A] Wound Margin: [3:Flat and Intact] [6:Flat and Intact] [N/A:N/A] Granulation Amount: [3:Medium (34-66%)] [6:Large (67-100%)] [N/A:N/A] Granulation Quality: [3:Pink] [6:Pink] [N/A:N/A] Necrotic Amount: [3:Medium (34-66%)] [6:None Present (0%)] [N/A:N/A] Exposed Structures: [3:Fat Layer (Subcutaneous Fat Layer (Subcutaneous N/A Tissue) Exposed: Yes Fascia: No Tendon: No Muscle: No Joint: No Bone: No] [6:Tissue) Exposed: Yes Fascia: No Tendon: No Muscle: No Joint: No Bone: No] Epithelialization: [3:None] [6:Small (1-33%)] [N/A:N/A] Debridement: [3:Debridement - Excisional Debridement - Excisional N/A] Pre-procedure [3:17:26] [6:17:26]  [N/A:N/A] Verification/Time Out Taken: Tissue Debrided: [3:Callus, Subcutaneous] [6:Callus, Subcutaneous] [N/A:N/A] Level: [3:Skin/Subcutaneous Tissue Skin/Subcutaneous Tissue N/A] Debridement Area (sq cm):5 [6:0.04] [N/A:N/A] Instrument: [3:Curette] [6:Curette] [N/A:N/A] Bleeding: [3:Minimum] [6:Minimum] [N/A:N/A] Hemostasis Achieved: [3:Pressure] [6:Pressure] [N/A:N/A] Procedural Pain: [3:0] [6:0] [N/A:N/A] Post Procedural Pain: [3:0] [6:0] [N/A:N/A] Debridement Treatment Procedure was tolerated [6:Procedure was tolerated] [N/A:N/A] Response: [3:well] [6:well] Post Debridement [3:2.5x2x0.7] [6:0.2x0.2x0.2] [N/A:N/A] Measurements L x W x D (cm) Post Debridement [3:2.749] [6:0.006] [N/A:N/A] Volume: (cm) Procedures Performed: [3:Debridement] [6:Debridement] [N/A:N/A] Treatment Notes Electronic Signature(s) Signed: 08/22/2019 5:59:58 PM By: [name] RN, BSN Signed: 08/22/2019 6:06:00 PM By: Linton Ham MD Entered By: Linton Ham on 08/22/2019 17:43:31 -------------------------------------------------------------------------------- Multi-Disciplinary Care Plan Details Patient Name: Date of Service: [name], [name] 08/22/2019 4:00 PM Medical Record [id number] Patient Account Number: [id number] Date of Birth/Sex: Treating RN: Nov 24, 1955 (63 y.o. [name] Primary Care Jaasiel Hollyfield: Carollee Herter, Kendrick Fries Other Clinician: Referring Illana Nolting: Treating Puneet Selden/Extender:Robson, Grafton Folk in Treatment: 15 Active Inactive Abuse / Safety / Falls / Self Care Management Nursing Diagnoses: Potential for injury related to falls Goals: Patient/caregiver will verbalize understanding of skin care regimen Date Initiated: 11/12/2018 Target Resolution Date: 08/29/2019 Goal Status: Active Interventions: Assess Activities of Daily Living upon admission and as needed Assess fall risk on admission and as needed Assess: immobility, friction,  shearing, incontinence upon admission and as needed Assess impairment of mobility on admission and as needed per policy Assess personal safety and home safety (as indicated) on admission and as needed Assess self care needs on admission and as needed Notes: Wound/Skin Impairment Nursing Diagnoses: Knowledge deficit related to ulceration/compromised skin integrity Goals: Patient/caregiver will verbalize understanding of skin care regimen Date Initiated: 04/11/2019 Target Resolution Date: 08/29/2019 Goal Status: Active Ulcer/skin breakdown will have a volume reduction of 30% by week 4 Date Initiated: 11/12/2018 Date Inactivated: 01/17/2019 Target Resolution Date: 01/11/2019 Goal Status: Met Ulcer/skin breakdown will have a volume reduction of 50% by week 8 Date Initiated: 01/17/2019 Date Inactivated: 02/14/2019 Target Resolution Date: 02/14/2019 Goal Status: Met Ulcer/skin breakdown will have a volume reduction of 80% by week 12 Date Initiated: 02/14/2019 Date Inactivated: 03/21/2019 Target Resolution Date: 03/14/2019 Unmet Reason: infection in Goal Status: Unmet wound Interventions: Assess patient/caregiver ability to obtain necessary supplies Assess patient/caregiver ability to perform ulcer/skin care regimen upon admission and as needed Assess ulceration(s) every visit Notes: Electronic Signature(s) Signed: 08/22/2019 5:59:58 PM By: [name] RN, BSN Entered By: [name] on 08/22/2019 17:55:21 -------------------------------------------------------------------------------- Pain Assessment Details Patient Name: Date of Service: [name] [name] 08/22/2019 4:00 PM Medical Record [id number] Patient Account Number: [id number] Date of Birth/Sex: Treating RN: [birth year] (63 y.o. [name] Primary Care Caidence Kaseman: [name] Other Clinician: Referring Raizy Auzenne: Treating Hadiya Spoerl/Extender:Robson, Lennie Odor CHASE, Tora Kindred in Treatment: 32 Active  Problems  Location of Pain Severity and Description of Pain Patient Has Paino Yes Site Locations Rate the pain. Current Pain Level: 7 Pain Management and Medication Current Pain Management: Electronic Signature(s) Signed: 08/22/2019 5:59:58 PM By: [name] RN, BSN Signed: 10/07/2019 3:02:15 PM By: [name] Entered By: [name] on 08/22/2019 17:00:02 -------------------------------------------------------------------------------- Patient/Caregiver Education Details Patient Name: [name] [name] 10/23/2020andnbsp4:00 Date of Service: PM Medical Record [id number] Number: Patient Account Number: [id number] Date of Birth/Gender: 1956-02-24 (63 y.o. F) Treating RN: [name] Other Clinician: Primary Care Physician: Carollee Herter, Antionette Poles Physician/Extender: Carollee Herter, Referring Physician: Tora Kindred in Treatment: 50 Education Assessment Education Provided To: Patient Education Topics Provided Wound/Skin Impairment: Methods: Explain/Verbal Responses: State content correctly Electronic Signature(s) Signed: 08/22/2019 5:59:58 PM By: [name] RN, BSN Entered By: [name] on 08/22/2019 17:55:32 -------------------------------------------------------------------------------- Wound Assessment Details Patient Name: Date of Service: [name], [name] 08/22/2019 4:00 PM Medical Record [id number] Patient Account Number: [id number] Date of Birth/Sex: Treating RN: 10-13-1956 (63 y.o. Orvan Falconer Primary Care Donni Oglesby: Carollee Herter, Kendrick Fries Other Clinician: Referring Ceanna Wareing: Treating Arwa Yero/Extender:Robson, Tennis Ship, Tora Kindred in Treatment: 40 Wound Status Wound Number: 3 Primary Diabetic Wound/Ulcer of the Lower Extremity Etiology: Wound Location: Left Calcaneus Wound Open Wounding Event: Trauma Status: Date Acquired: 11/12/2016 Comorbid Cataracts, Anemia, Coronary Artery Disease, Weeks Of  Treatment: 40 History: Hypertension, Myocardial Infarction, Type II Clustered Wound: No Diabetes, Osteoarthritis, Osteomyelitis, Neuropathy Wound Measurements Length: (cm) 2.5 Width: (cm) 2 Depth: (cm) 0.7 Area: (cm) 3.927 Volume: (cm) 2.749 Wound Description Classification: Grade 2 Wound Margin: Flat and Intact Exudate Amount: Medium Exudate Type: Serosanguineous Exudate Color: red, brown Wound Bed Granulation Amount: Medium (34-66%) Granulation Quality: Pink Necrotic Amount: Medium (34-66%) Necrotic Quality: Adherent Slough After Cleansing: No rino Yes Exposed Structure sed: No Subcutaneous Tissue) Exposed: Yes sed: No sed: No ed: No d: No % Reduction in Area: 26.7% % Reduction in Volume: 57.2% Epithelialization: None Tunneling: No Undermining: No Foul Odor Slough/Fib Fascia Expo Fat Layer ( Tendon Expo Muscle Expo Joint Expos Bone Expose Electronic Signature(s) Signed: 10/07/2019 3:01:15 PM By: Carlene Coria RN Entered By: Carlene Coria on 08/22/2019 17:05:20 -------------------------------------------------------------------------------- Wound Assessment Details Patient Name: Date of Service: [name], [name] 08/22/2019 4:00 PM Medical Record [id number] Patient Account Number: [id number] Date of Birth/Sex: Treating RN: Feb 28, 1956 (63 y.o. Orvan Falconer Primary Care Jarmarcus Wambold: Carollee Herter, Kendrick Fries Other Clinician: Referring Patsie Mccardle: Treating Freeman Borba/Extender:Robson, Tennis Ship, Tora Kindred in Treatment: 40 Wound Status Wound Number: 6 Primary Diabetic Wound/Ulcer of the Lower Extremity Etiology: Wound Location: Left Toe Great Wound Open Wounding Event: Blister Status: Date Acquired: 06/19/2019 Comorbid Cataracts, Anemia, Coronary Artery Disease, Weeks Of Treatment: 9 History: Hypertension, Myocardial Infarction, Type II Clustered Wound: No Diabetes, Osteoarthritis, Osteomyelitis, Neuropathy Photos Wound Measurements Length:  (cm) 0.2 Width: (cm) 0.2 Depth: (cm) 0.2 Area: (cm) 0.031 Volume: (cm) 0.006 Wound Description Classification: Grade 2 Wound Margin: Flat and Intact Exudate Amount: Small Exudate Type: Serosanguineous Exudate Color: red, brown Wound Bed Granulation Amount: Large (67-100%) Granulation Quality: Pink Necrotic Amount: None Present (0%) fter Cleansing: No ino No Exposed Structure ed: No ubcutaneous Tissue) Exposed: Yes ed: No ed: No d: No : No % Reduction in Area: -287.5% % Reduction in Volume: -500% Epithelialization: Small (1-33%) Tunneling: No Undermining: No Foul Odor A Slough/Fibr Fascia Expos Fat Layer (S Tendon Expos Muscle Expos Joint Expose Bone Exposed Electronic Signature(s) Signed: 08/26/2019 3:44:28 PM By: Mikeal Hawthorne EMT/HBOT Signed: 10/07/2019 3:01:15 PM By: Carlene Coria RN Entered By:  Mikeal Hawthorne on 08/26/2019 11:04:32 -------------------------------------------------------------------------------- Vitals Details Patient Name: Date of Service: [name], [name] 08/22/2019 4:00 PM Medical Record [id number] Patient Account Number: [id number] Date of Birth/Sex: Treating RN: [birth year] (63 y.o. [name] Primary Care Daijon Wenke: Carollee Herter, Kendrick Fries Other Clinician: Referring Murad Staples: Treating Marrissa Dai/Extender:Robson, Tennis Ship, Tora Kindred in Treatment: 40 Vital Signs Time Taken: 16:59 Temperature (F): 98.5 Height (in): 62 Pulse (bpm): 73 Weight (lbs): 180 Respiratory Rate (breaths/min): 18 Body Mass Index (BMI): 32.9 Blood Pressure (mmHg): 159/76 Reference Range: 80 - 120 mg / dl Electronic Signature(s) Signed: 10/07/2019 3:02:15 PM By: [name] Entered By: [name] on 08/22/2019 16:59:52

## 2019-10-07 NOTE — Progress Notes (Signed)
MAIYAH, GOYNE ([id number]) Visit Report for 09/05/2019 Arrival Information Details Patient Name: Date of Service: ROBBY, PIRANI 09/05/2019 3:30 PM Medical Record [id number] Patient Account Number: [id number] Date of Birth/Sex: Treating RN: January 25, 1956 (63 y.o. [name] Primary Care Amaiyah Nordhoff: [name] Other Clinician: Referring Darroll Bredeson: Treating Kasmira Cacioppo/Extender:Robson, [place] Folk in Treatment: 50 Visit Information History Since Last Visit Added or deleted any medications: No Patient Arrived: Other Any new allergies or adverse reactions: No Arrival Time: 15:58 Had a fall or experienced change in No Accompanied By: self activities of daily living that may affect Transfer Assistance: None risk of falls: Patient Identification Verified: Yes Signs or symptoms of abuse/neglect since last No Secondary Verification Process Yes visito Completed: Hospitalized since last visit: No Patient Requires Transmission- No Implantable device outside of the clinic excluding No Based Precautions: cellular tissue based products placed in the center Patient Has Alerts: Yes since last visit: Patient Alerts: R ABI = 1.12, L Has Dressing in Place as Prescribed: Yes ABI = 1.1 Pain Present Now: Yes Electronic Signature(s) Signed: 10/07/2019 3:00:22 PM By: [name] Entered By: [name] on 09/05/2019 15:58:43 -------------------------------------------------------------------------------- Encounter Discharge Information Details Patient Name: Date of Service: [name]. 09/05/2019 3:30 PM Medical Record [id number] Patient Account Number: [id number] Date of Birth/Sex: Treating RN: 1956/01/10 (63 y.o. [name] Primary Care Jerrell Mangel: Carollee Herter, Kendrick Fries Other Clinician: Referring Marcille Barman: Treating Tatianna Ibbotson/Extender:Robson, [place] Folk in Treatment: 31 Encounter Discharge Information Items  Post Procedure Vitals Discharge Condition: Stable Temperature (F): 98.2 Ambulatory Status: Wheelchair Pulse (bpm): 84 Discharge Destination: Home Respiratory Rate (breaths/min): 18 Transportation: Private Auto Blood Pressure (mmHg): 159/69 Accompanied By: self Schedule Follow-up Appointment: Yes Clinical Summary of Care: Electronic Signature(s) Signed: 09/05/2019 5:26:58 PM By: Deon Pilling Entered By: Deon Pilling on 09/05/2019 17:26:46 -------------------------------------------------------------------------------- Lower Extremity Assessment Details Patient Name: Date of Service: BRETTA, FEES 09/05/2019 3:30 PM Medical Record [id number] Patient Account Number: [id number] Date of Birth/Sex: Treating RN: Dec 13, 1955 (63 y.o. [name] Primary Care Samit Sylve: Carollee Herter, Kendrick Fries Other Clinician: Referring Avagail Whittlesey: Treating Eilan Mcinerny/Extender:Robson, Tennis Ship, Tora Kindred in Treatment: 42 Edema Assessment Assessed: [Left: No] [Right: No] Edema: [Left: N] [Right: o] Calf Left: Right: Point of Measurement: 28 cm From Medial Instep 35.4 cm cm Ankle Left: Right: Point of Measurement: 10 cm From Medial Instep 21.5 cm cm Vascular Assessment Pulses: Dorsalis Pedis Palpable: [Left:Yes] Electronic Signature(s) Signed: 09/08/2019 5:46:53 PM By: [name] RN, BSN Entered By: [name] on 09/05/2019 16:15:45 -------------------------------------------------------------------------------- Multi Wound Chart Details Patient Name: Date of Service: [name] 09/05/2019 3:30 PM Medical Record [id number] Patient Account Number: [id number] Date of Birth/Sex: Treating RN: Jan 05, 1956 (63 y.o. 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] [6:Lower Extremity] Comorbid History: [3:Cataracts, Anemia, Coronary Artery Disease, Coronary Artery Disease, Hypertension, Myocardial Hypertension, Myocardial Infarction, Type II Diabetes, Infarction, Type II Diabetes, Osteoarthritis, Osteomyelitis, Neuropathy  Osteomyelitis, Neuropathy] [6:Cataracts, Anemia, Osteoarthritis,] [N/A:N/A] Date Acquired: [3:11/12/2016] [6:06/19/2019] [N/A:N/A] Weeks of Treatment: [3:42] [6:11] [N/A:N/A] Wound Status: [3:Open] [6:Open] [N/A:N/A] Measurements L x W x D 2.6x2x0.7 [6:0.2x0.2x0.2] [N/A:N/A] (cm) Area (  cm) : [3:4.084] [6:0.031] [N/A:N/A] Volume (cm) : [3:2.859] [6:0.006] [N/A:N/A] % Reduction in Area: [3:23.70%] [6:-287.50%] [N/A:N/A] % Reduction in Volume: 55.50% [6:-500.00%] [N/A:N/A] Classification: [3:Grade 2] [6:Grade 2] [N/A:N/A] Exudate Amount: [3:Medium] [6:Small] [N/A:N/A] Exudate Type: [3:Serosanguineous] [6:Serosanguineous] [N/A:N/A] Exudate Color: [3:red, brown] [6:red, brown] [N/A:N/A] Wound Margin: [3:Flat and Intact] [6:Flat and Intact] [N/A:N/A] Granulation Amount: [3:Large (67-100%)] [6:Large (67-100%)] [N/A:N/A] Granulation Quality: [3:Pink] [6:Red] [N/A:N/A] Necrotic Amount: [3:Small (1-33%)] [6:None Present (0%)] [N/A:N/A] Exposed Structures: [3:Fat Layer (Subcutaneous Fat Layer (Subcutaneous N/A Tissue) Exposed: Yes Fascia: No Tendon: No Muscle: No Joint: No Bone: No] [6:Tissue) Exposed: Yes Fascia: No Tendon: No Muscle: No Joint: No Bone: No] Epithelialization: [3:Small (1-33%)] [6:Large (67-100%)] [N/A:N/A] Debridement: [3:N/A] [6:Debridement -  Selective/Open Wound] [N/A:N/A] Pre-procedure [3:N/A] [6:16:42] [N/A:N/A] Verification/Time Out Taken: Tissue Debrided: [3:N/A] [6:Callus] [N/A:N/A] Level: [3:N/A] [6:Skin/Epidermis] [N/A:N/A] Debridement Area (sq cm):N/A [6:0.25] [N/A:N/A] Instrument: [3:N/A] [6:Curette] [N/A:N/A] Bleeding: [3:N/A] [6:None] [N/A:N/A] Debridement Treatment [3:N/A] [6:Procedure was tolerated] [N/A:N/A] Response: [6:well] Post Debridement [3:N/A] [6:0.2x0.2x0.2] [N/A:N/A] Measurements L x W x D (cm) Post Debridement [3:N/A] [6:0.006] [N/A:N/A] Volume: (cm) Procedures Performed: [3:N/A] [6:Debridement] [N/A:N/A] Treatment Notes Wound #3 (Left Calcaneus) 1. Cleanse With Wound Cleanser 3. Primary Dressing Applied Collegen AG Hydrogel or K-Y Jelly Other primary dressing (specifiy in notes) 4. Secondary Dressing Dry Gauze Roll Gauze 5. Secured With Tape 7. Footwear/Offloading device applied Felt/Foam Wedge shoe Notes moisten hydrogel packed gauze behind prisma. netting. Wound #6 (Left Toe Great) 1. Cleanse With Wound Cleanser 3. Primary Dressing Applied Collegen AG 4. Secondary Dressing Dry Gauze Roll Gauze 5. Secured With Tape 7. Footwear/Offloading device applied Felt/Foam Electronic Signature(s) Signed: 09/07/2019 8:54:13 AM By: Linton Ham MD Signed: 09/09/2019 2:49:59 PM By: Baruch Gouty RN, BSN Entered By: Linton Ham on 09/07/2019 07:00:23 -------------------------------------------------------------------------------- Multi-Disciplinary Care Plan Details Patient Name: Date of Service: NAMIAH, DUNNAVANT 09/05/2019 3:30 PM Medical Record [id number] Patient Account Number: [id number] Date of Birth/Sex: Treating RN: Jul 21, 1956 (63 y.o. F) Kela Millin Primary Care Dorethy Tomey: Other Clinician: [name] Referring Tavin Vernet: Treating Carlos Heber/Extender:Robson, [place] Folk in Treatment: 71 Active Inactive Abuse / Safety / Falls  / Self Care Management Nursing Diagnoses: Potential for injury related to falls Goals: Patient/caregiver will verbalize understanding of skin care regimen Date Initiated: 11/12/2018 Target Resolution Date: 09/26/2019 Goal Status: Active Interventions: Assess Activities of Daily Living upon admission and as needed Assess fall risk on admission and as needed Assess: immobility, friction, shearing, incontinence upon admission and as needed Assess impairment of mobility on admission and as needed per policy Assess personal safety and home safety (as indicated) on admission and as needed Assess self care needs on admission and as needed Notes: Wound/Skin Impairment Nursing Diagnoses: Knowledge deficit related to ulceration/compromised skin integrity Goals: Patient/caregiver will verbalize understanding of skin care regimen Date Initiated: 04/11/2019 Target Resolution Date: 09/26/2019 Goal Status: Active Ulcer/skin breakdown will have a volume reduction of 30% by week 4 Date Initiated: 11/12/2018 Date Inactivated: 01/17/2019 Target Resolution Date: 01/11/2019 Goal Status: Met Ulcer/skin breakdown will have a volume reduction of 50% by week 8 Date Initiated: 01/17/2019 Date Inactivated: 02/14/2019 Target Resolution Date: 02/14/2019 Goal Status: Met Ulcer/skin breakdown will have a volume reduction of 80% by week 12 Date Initiated: 02/14/2019 Date Inactivated: 03/21/2019 Target Resolution Date: 03/14/2019 Unmet Reason: infection in Goal Status: Unmet wound Interventions: Assess patient/caregiver ability to obtain necessary supplies Assess patient/caregiver ability to perform ulcer/skin care regimen upon admission and as needed Assess ulceration(s) every visit Notes: Electronic Signature(s) Signed: 09/05/2019 6:04:56 PM By: Verita Schneiders,  Larene Beach Entered By: Kela Millin on 09/05/2019 16:40:32 -------------------------------------------------------------------------------- Pain Assessment  Details Patient Name: Date of Service: AOLANIS, CRISPEN 09/05/2019 3:30 PM Medical Record [id number] Patient Account Number: [id number] Date of Birth/Sex: Treating RN: Jul 09, 1956 (63 y.o. [name] Primary Care Sony Schlarb: Carollee Herter, Kendrick Fries Other Clinician: Referring Cathi Hazan: Treating Ellanora Rayborn/Extender:Robson, [place] Folk in Treatment: 61 Active Problems Location of Pain Severity and Description of Pain Patient Has Paino Yes Site Locations Rate the pain. Current Pain Level: 8 Pain Management and Medication Current Pain Management: Electronic Signature(s) Signed: 09/05/2019 6:12:06 PM By: Baruch Gouty RN, BSN Signed: 10/07/2019 3:00:22 PM By: [name] Entered By: [name] on 09/05/2019 16:01:01 -------------------------------------------------------------------------------- Patient/Caregiver Education Details Patient Name: Date of Service: [name] 11/6/2020andnbsp3:30 PM Medical Record Patient Account Number: [id number] Number: Treating RN: Kela Millin Date of Birth/Gender: Aug 30, 1956 (63 y.o. F) Other Clinician: Primary Care Physician: Carollee Herter, Kendrick Fries Treating Royal Hawthorn Referring Physician: [name] Physician/Extender: Suella Grove in Treatment: 38 Education Assessment Education Provided To: Patient Education Topics Provided Wound/Skin Impairment: Methods: Explain/Verbal Responses: State content correctly Electronic Signature(s) Signed: 09/05/2019 6:04:56 PM By: Kela Millin Entered By: Kela Millin on 09/05/2019 16:40:46 -------------------------------------------------------------------------------- Wound Assessment Details Patient Name: Date of Service: LEYDI, WINSTEAD 09/05/2019 3:30 PM Medical Record [id number] Patient Account Number: [id number] Date of Birth/Sex: Treating RN: May 26, 1956 (63 y.o. [name] Primary Care  Maxamus Colao: Carollee Herter, Kendrick Fries Other Clinician: Referring Ayleen Mckinstry: Treating Kahlen Morais/Extender:Robson, Tennis Ship, Tora Kindred in Treatment: 42 Wound Status Wound Number: 3 Primary Diabetic Wound/Ulcer of the Lower Extremity Etiology: Wound Location: Left Calcaneus Wound Open Wounding Event: Trauma Status: Date Acquired: 11/12/2016 Comorbid Cataracts, Anemia, Coronary Artery Disease, Weeks Of Treatment: 42 History: Hypertension, Myocardial Infarction, Type II Clustered Wound: No Diabetes, Osteoarthritis, Osteomyelitis, Neuropathy Photos Wound Measurements Length: (cm) 2.6 % Reductio Width: (cm) 2 % Reductio Depth: (cm) 0.7 Epithelial Area: (cm) 4.084 Tunneling Volume: (cm) 2.859 Undermini Wound Description Classification: Grade 2 Wound Margin: Flat and Intact Exudate Amount: Medium Exudate Type: Serosanguineous Exudate Color: red, brown Wound Bed Granulation Amount: Large (67-100%) Granulation Quality: Pink Necrotic Amount: Small (1-33%) Necrotic Quality: Adherent Slough Foul Odor After Cleansing: No Slough/Fibrino Yes Exposed Structure Fascia Exposed: No Fat Layer (Subcutaneous Tissue) Exposed: Yes Tendon Exposed: No Muscle Exposed: No Joint Exposed: No Bone Exposed: No n in Area: 23.7% n in Volume: 55.5% ization: Small (1-33%) : No ng: No Electronic Signature(s) Signed: 09/09/2019 2:49:59 PM By: Baruch Gouty RN, BSN Signed: 09/09/2019 4:05:15 PM By: Mikeal Hawthorne EMT/HBOT Previous Signature: 09/05/2019 6:12:06 PM Version By: Baruch Gouty RN, BSN Previous Signature: 09/08/2019 5:46:53 PM Version By: [name] RN, BSN Entered By: Mikeal Hawthorne on 09/09/2019 08:50:08 -------------------------------------------------------------------------------- Wound Assessment Details Patient Name: Date of Service: WYNELL, HALBERG 09/05/2019 3:30 PM Medical Record [id number] Patient Account Number: [id number] Date of Birth/Sex: Treating  RN: [birth year] (63 y.o. [name] Primary Care Mattew Chriswell: Carollee Herter, Kendrick Fries Other Clinician: Referring Hollace Michelli: Treating Landon Truax/Extender:Robson, Tennis Ship, Tora Kindred in Treatment: 42 Wound Status Wound Number: 6 Primary Diabetic Wound/Ulcer of the Lower Extremity Etiology: Wound Location: Left Toe Great Wound Open Wounding Event: Blister Status: Date Acquired: 06/19/2019 Comorbid Cataracts, Anemia, Coronary Artery Disease, Weeks Of Treatment: 11 History: Hypertension, Myocardial Infarction, Type II Clustered Wound: No Diabetes, Osteoarthritis, Osteomyelitis, Neuropathy Photos Wound Measurements Length: (cm) 0.2 % Reductio Width: (cm) 0.2 % Reductio Depth: (cm) 0.2 Epithelial Area: (cm) 0.031 Tunneling Volume: (cm) 0.006 Undermini Wound Description Classification: Grade 2 Wound Margin:  Flat and Intact Exudate Amount: Small Exudate Type: Serosanguineous Exudate Color: red, brown Wound Bed Granulation Amount: Large (67-100%) Granulation Quality: Red Necrotic Amount: None Present (0%) Foul Odor After Cleansing: No Slough/Fibrino No Exposed Structure Fascia Exposed: No Fat Layer (Subcutaneous Tissue) Exposed: Yes Tendon Exposed: No Muscle Exposed: No Joint Exposed: No Bone Exposed: No n in Area: -287.5% n in Volume: -500% ization: Large (67-100%) : No ng: No Electronic Signature(s) Signed: 09/09/2019 2:49:59 PM By: Baruch Gouty RN, BSN Signed: 09/09/2019 4:05:15 PM By: Mikeal Hawthorne EMT/HBOT Previous Signature: 09/05/2019 6:12:06 PM Version By: Baruch Gouty RN, BSN Previous Signature: 09/08/2019 5:46:53 PM Version By: [name] RN, BSN Entered By: Mikeal Hawthorne on 09/09/2019 08:49:43 -------------------------------------------------------------------------------- [place] Details Patient Name: Date of Service: KESLIE, GRITZ 09/05/2019 3:30 PM Medical Record [id number] Patient Account Number: [id number] Date of  Birth/Sex: Treating RN: December 07, 1955 (63 y.o. [name] Primary Care Asako Saliba: Carollee Herter, Kendrick Fries Other Clinician: Referring Indea Dearman: Treating Emalene Welte/Extender:Robson, Tennis Ship, Tora Kindred in Treatment: 6 Vital Signs Time Taken: 15:58 Temperature (F): 98.2 Height (in): 62 Pulse (bpm): 84 Weight (lbs): 180 Respiratory Rate (breaths/min): 18 Body Mass Index (BMI): 32.9 Blood Pressure (mmHg): 155/69 Reference Range: 80 - 120 mg / dl Electronic Signature(s) Signed: 10/07/2019 3:00:22 PM By: [name] Entered By: [name] on 09/05/2019 16:00:35

## 2019-10-09 ENCOUNTER — Other Ambulatory Visit (HOSPITAL_COMMUNITY)
Admission: RE | Admit: 2019-10-09 | Discharge: 2019-10-09 | Disposition: A | Discharge status: Home / Self Care | Payer: PRIVATE HEALTH INSURANCE | Source: Other Acute Inpatient Hospital | Attending: Internal Medicine | Admitting: Internal Medicine

## 2019-10-09 DIAGNOSIS — N390 Urinary tract infection, site not specified: Secondary | ICD-10-CM | POA: Insufficient documentation

## 2019-10-09 DIAGNOSIS — M0009 Staphylococcal polyarthritis: Secondary | ICD-10-CM | POA: Insufficient documentation

## 2019-10-09 LAB — BASIC METABOLIC PANEL
Anion gap: 9 (ref 5–15)
BUN: 20 mg/dL (ref 8–23)
CO2: 27 mmol/L (ref 22–32)
Calcium: 9.3 mg/dL (ref 8.9–10.3)
Chloride: 99 mmol/L (ref 98–111)
Creatinine, Ser: 1.55 mg/dL — ABNORMAL HIGH (ref 0.44–1.00)
GFR calc Af Amer: 41 mL/min — ABNORMAL LOW (ref >60)
GFR calc non Af Amer: 35 mL/min — ABNORMAL LOW (ref >60)
Glucose, Bld: 343 mg/dL — ABNORMAL HIGH (ref 70–99)
Potassium: 5 mmol/L (ref 3.5–5.1)
Sodium: 135 mmol/L (ref 135–145)

## 2019-10-09 LAB — CBC
HCT: 38 % (ref 36.0–46.0)
Hemoglobin: 12 g/dL (ref 12.0–15.0)
MCH: 27.4 pg (ref 26.0–34.0)
MCHC: 31.6 g/dL (ref 30.0–36.0)
MCV: 86.8 fL (ref 80.0–100.0)
Platelets: 153 10*3/uL (ref 150–400)
RBC: 4.38 MIL/uL (ref 3.87–5.11)
RDW: 16.6 % — ABNORMAL HIGH (ref 11.5–15.5)
WBC: 6.1 10*3/uL (ref 4.0–10.5)
nRBC: 0 % (ref 0.0–0.2)

## 2019-10-09 LAB — VANCOMYCIN, TROUGH: Vancomycin Tr: 14 ug/mL — ABNORMAL LOW (ref 15–20)

## 2019-10-09 LAB — CK: Total CK: 147 U/L (ref 38–234)

## 2019-10-10 ENCOUNTER — Other Ambulatory Visit (HOSPITAL_COMMUNITY)
Admission: RE | Admit: 2019-10-10 | Discharge: 2019-10-10 | Disposition: A | Discharge status: Home / Self Care | Payer: PRIVATE HEALTH INSURANCE | Source: Other Acute Inpatient Hospital | Attending: Internal Medicine | Admitting: Internal Medicine

## 2019-10-10 ENCOUNTER — Encounter (HOSPITAL_BASED_OUTPATIENT_CLINIC_OR_DEPARTMENT_OTHER): Payer: PRIVATE HEALTH INSURANCE | Attending: Internal Medicine | Admitting: Internal Medicine

## 2019-10-10 ENCOUNTER — Other Ambulatory Visit: Payer: Self-pay

## 2019-10-10 DIAGNOSIS — L97423 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: Secondary | ICD-10-CM | POA: Insufficient documentation

## 2019-10-13 NOTE — Progress Notes (Signed)
[name] [name], [name] [name] ([id number]) Visit Report for 10/10/2019 Debridement Details Patient Name: Date of Service: [name] [name], [name] [name] 10/10/2019 3:15 PM Medical Record [id number] Patient Account Number: [id number] Date of Birth/Sex: Treating RN: [birth year] (63 y.o. F) Primary Care Provider: Carollee Herter, Kendrick Fries Other Clinician: Referring Provider: Carollee Herter, Kendrick Fries Treating Provider/Extender:Yarden Manuelito, Esperanza Richters in Treatment: 47 Debridement Performed for Wound #3 Left Calcaneus Assessment: Performed By: Physician Ricard Dillon., MD Debridement Type: Debridement Severity of Tissue Pre Fat layer exposed Debridement: Level of Consciousness (Pre- Awake and Alert procedure): Pre-procedure Verification/Time Out Taken: Yes - 16:55 Start Time: 16:55 Pain Control: Other : benzocaine, 20% Total Area Debrided (L x W): 2 (cm) x 2 (cm) = 4 (cm) Tissue and other material Viable, Non-Viable, Subcutaneous, Skin: Epidermis debrided: Level: Skin/Subcutaneous Tissue Debridement Description: Excisional Instrument: Blade, Forceps Specimen: Swab, Number of Specimens Taken: 1 Bleeding: Minimum Hemostasis Achieved: Pressure End Time: 16:56 Procedural Pain: 0 Post Procedural Pain: 0 Response to Treatment: Procedure was tolerated well Level of Consciousness Awake and Alert (Post-procedure): Post Debridement Measurements of Total Wound Length: (cm) 2.5 Width: (cm) 2.2 Depth: (cm) 0.3 Volume: (cm) 1.296 Character of Wound/Ulcer Post Requires Further Debridement Debridement: Severity of Tissue Post Debridement: Fat layer exposed Post Procedure Diagnosis Same as Pre-procedure Electronic Signature(s) Signed: 10/10/2019 6:34:01 PM By: Linton Ham MD Entered By: Linton Ham on 10/10/2019 18:03:50 -------------------------------------------------------------------------------- HPI Details Patient Name: Date of Service: [name] [name] 10/10/2019 3:15 PM Medical Record  [id number] Patient Account Number: [id number] Date of Birth/Sex: Treating RN: [birth year] (63 y.o. F) Primary Care Provider: [name] Other Clinician: Referring Provider: Treating Provider/Extender:Inri Sobieski, Enedina Finner Weeks in Treatment: 59 History of Present Illness HPI Description: ADMISSION 11/12/2018 This is a 63 year old woman with type 2 diabetes and diabetic neuropathy. She has been dealing with a left heel plantar wound for roughly 2 years. She states this started when she pulled some skin off the area and it progressed into a wound. She also has an area on the tip of her left great toe for 1 year. She has been largely followed by Dr. Sharol Given and she had an excision of bone in the left heel in 2017. I do not see microbiology from this excision or pathology. Apparently this wound never really closed. She most recently has been using Silvadene cream. Offloading this with a scooter. The last MRI I see was in June 2018 which did not show osteomyelitis at that time. Apparently this wound is never really progressed towards healing. During her last review by Dr. Sharol Given in October it was recommended that she undergo a left BKA and she refused. She went to see a second orthopedic consult at [facility] who recommended conservative wound care to see if this will close or progress towards closure but also warned that possible surgery may be necessary. An x-ray that was done at [facility] showed an irregular calcaneal body and calcaneal tuberosity. This is probably postprocedural. Previous x-rays at [facility] had suggested heterotrophic calcifications but I do not see this. Apparently a second ointment was added to the Silvadene which the patient thinks is because some improvement. She has not been systemically unwell. No fever or chills. She thinks the second ointment that was given to her at [facility] has helped somewhat. 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 ABIs done in June 2018 showed a ABI of 1.12 on the right and 1.1 on the left she was biphasic bilaterally. ABIs in our clinic were noncompressible today. 11/20/18 on evaluation today patient is seen for her second visit here in the office although this is actually the first visit with me concerning an issue that she's having with her great toe and heel of the left foot. Fortunately she does not appear to be have any discomfort at this time and she does have a scooter in order to offload her foot she also has a boot for offloading. With that being said this is something that appears to have been going on for some time she was seeing Dr. due to his recommendation was that she was going to require a below knee amputation. With that being said she wanted to come to the wound center but according to the patient Dr. Sharol Given told her that "we could not help her". Nonetheless she saw another provider who suggested that it may be worth a shot for [name] to try and help her out if it all possible. Nonetheless she decided that it would be worth a trial since otherwise any the way she's gonna end up with an amputation. Obviously if we can heal the wound then that will not be the case. Again I explained to her that obviously there are no guarantees but we will give this a good try and attempts to get the wound to heal. 1/30; the patient continues to have areas on the left plantar heel and the left plantar great toe. The more worrisome area is the heel. She went for MRI on Saturday but this could not be done because she had silver alginate in the wound bed. I would like to get this rebooked. If she does not have osteomyelitis she  will need a total contact cast. We have been using silver alginate in the wounds 2/7; patient has wounds on her left plantar heel and left plantar great toe. The area on the heel has some depth although it looks about the same today. Her MRI will finally be done tomorrow. If she has osteomyelitis in the heel and then we will need to consider her for IV antibiotics and hyperbaric oxygen. If the MRI is negative she will need a total contact cast although she is wearing a cam boot and using a scooter at present. Will be using silver alginate. She will take it off tomorrow in preparation for the MRI 2/14; the patient's MRI showed extensive surgical changes with a large portion of her calcaneus removed on the left secondary to her previous surgery by Dr. Sharol Given however there is no evidence of osteomyelitis. She would therefore be a candidate for a total contact cast and we applied this for the first time today 2/21; silver collagen total contact cast. The area on the plantar left great toe is "healed" still a lot of callus on this area. Dimensions on the plantar heel not too much different some epithelialization is present however. This is an improvement 12/25/18 on evaluation today patient actually is seen for follow-up concerning issues that she has been having with her cast she states she feels like it's wet and squishy in the bottom of her cast. The reason she does come in to have this evaluated and see what is going on. Fortunately there's no signs of infection at this  time was the cast was removed. 3/6; the patient's area on the tip of her right great toe is callused but there is no open area here. She still has the fairly extensive area in the left heel. We have been using silver collagen in the wound. 01/08/19 on evaluation today patient actually appears to be doing very well in regard to her heel ulcer in my pinion to see if you shown signs of improvement which is excellent news. There's no  evidence of infection. She continues to have quite a bit of drainage but fortunately again this doesn't seem to be hindering her healing. 3/20 -Patient's foot ulcer on the left appears to be doing well, the dimensions appear encouraging, we have been using total contact cast with Prisma and will continue doing that 3/27; patient continues to make nice progress on the left plantar heel. She is using silver collagen under a total contact cast. It is been a while since I have seen this wound and it really looks a lot better. Smaller with healthy granulation 4/3; she continues to make nice progress on the left plantar heel. Using silver collagen under a total contact cast 4/10; left plantar heel. Again skin over the surface of the wound with not much in the way of adherence. This results in undermining. Using silver collagen changed to silver alginate 4/17 left plantar heel. Again not much improvement. There is no undermining today no debridement was required we used silver alginate last time because of excessive moisture 4/24 left plantar heel. Again not as much improvement as I would have liked. About 3 mm of depth. Thick callused skin around the circumference. Using silver alginate 5/1; left plantar heel this is improved this week. Less depth epithelialization is present. Using silver alginate alginate under a total contact cast 5/8; left plantar heel dimensions are about the same. The depth appears to be improved. I use silver collagen starting today under the cast 5/15 left plantar heel. Arrives today with a 2-day history of feeling like something had "slipped" while walking her dog in the cast. Unfortunately extensive area relatively to the small wound of undermining superiorly denuded epithelium. 5/22-Patient returns at 1 week after being taken off the [facility] on account of some fluid collection that was debrided and cultured at last visit from the plantar ulcer on the left heel. The culture  results are polymicrobial with Klebsiella, enterococcus, Proteus growth these organisms are sensitive to Cipro except with enterococcus which is sensitive to ampicillin but patient is highly allergic to penicillin according to her. 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 5/29; the patient arrived last week with a new wound on her left plantar great toe. With regards to the culture that I did 2 weeks ago of her deteriorating heel wound this grew Klebsiella and enterococcus. She was given Cipro however the enterococcus would not be covered well by a quinolone. She is allergic to penicillin. I will give her linezolid 600 twice daily x5 days 6/12; the patient continues to have a wound on her left and after she returns to the beach there is undermining laterally. She has the new wound from this 2 weeks or so ago on the plantar tip of her left great toe. She had a fall today scraping the dorsal surface of the left fifth 7/14; READMISSION since the patient was last here she was hospitalized from 04/15/2019 through 04/22/2019. She had  presented to an outside ER after falling and hitting her elbow. She had had previous surgery on the elbow and fractures several years ago. An x-ray was negative and she was sent home. She is readmitted with sepsis and acute renal failure secondary to septic arthritis of the elbow. Cultures apparently showed pansensitive staph aureus however she has a severe beta-lactam allergy. She was treated with vancomycin. Apparently the vancomycin is completed and her PICC line is removed although she is seeing Dr. Megan Salon tomorrow due to continued pain and swelling. In the hospital she had an IandD by Dr. [name] of orthopedics. The original surgery was on 04/17/2019 and it was felt that she had septic arthritis. She is still having a lot of pain and swelling in the right elbow and apparently is  due to see Dr. Megan Salon tomorrow and what she thinks is that she will be restarted on antibiotics. With regards to her heel wounds/left foot wounds. Her arterial studies were checked and her ABIs were within normal limits. X-ray showed plantar foot ulcer negative for osteomyelitis postop resection of the posterior calcaneus. She has been using silver alginate to the heel 8/4-Patient returns after being seen on 7/14, we are using silver alginate to the calcaneal wound, she is continuing to receive care for her right elbow septic arthritis 8/14- Patient returns after 1 week, she is being seen by the surgical group for her right elbow, she is here for the left calcaneal wound for which we are using silver alginate this is about the same 8/21; this is a patient I readmitted to the clinic 5 weeks ago. She is continuing to have difficulties with a septic arthritis of the right elbow she had after a fall and apparently has had surgical IandD's since the last time we saw her. She is also following with Dr. Megan Salon of infectious disease and is apparently on 3 oral antibiotics although at the time of this dictation I am not sure what they are. She comes in today with a necrotic surface on the left great toe with a blister laterally. This was still clearly an open wound. She had purulent drainage coming out of this. The original wound on the plantar aspect of her right heel in the setting of a Charcot foot is deep not open to bone but certainly not any better at all. We have been using silver alginate 8/28; dealing with septic arthritis of the right elbow. May need to go for further surgery here in the second week of September. She had a blister on the left great toe that was purulent [place] draining last week although a culture did not grow anything [already on antibiotics through infectious disease for her elbow]. The punched-out area on her heel is just like it was when she first came in. This almost closed  with a total contact cast there are no options for that now. The patient states she cannot stay off her foot having to do housework X-ray of the foot showed a moderate bunion and severe degenerative changes at the first metatarsal phalangeal joint there was a moderate plantar calcaneal spur. We will need to check the location of this. 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 It is oral. We are using silver collagen to the punched-out area on her left heel and to the summer superficial area  of the left great toe. She is offloading this is best she can although judging by the amount of callus it is not enough. She thinks she has enough strength in her right elbow now to use her scooter. 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 Hale Bogus notes and infectious disease. We are using silver collagen to the punched-out areas on her left heel and to the plantar aspect of her left great toe she is now using her scooter to get around and to help offload these areas 10/2; 2-week follow-up. Still on clindamycin I believe for the left elbow she is going for an MRI of the elbow over the weekend. She is then going to see orthopedics and infectious disease. The area on the left heel is certainly no better. This does not probe to bone however there is green drainage. She has an area on the tip of her toe. The area on the heel is in a wound we almost closed at one point with total contact casting. 10/9; one-week follow-up. Culture I did of the heel last week which was a swab culture admittedly showed moderate Enterococcus faecalis moderate Pseudomonas. The wound itself looks about the same.  There is no palpable bone although the depth of it closely approximates bone. The heel is swollen but not erythematous. Her MRI is booked for next week. She also has an MRI of the right elbow. I've also lifted the last consult from Dr. Megan Salon who is following her for septic arthritis of the right elbow. I did not see him specifically comment on her left heel however the patient states that he is aware of this. I did not specifically address the organisms I cultured last week because of the possible effect of any additional cultures that will be done on the elbow. Additionally these were superficial not bone cultures 10/16; her MRI of the heel was put back to next week. She did have her elbow done. I have been in contact with Dr. Megan Salon about my concerns about osteomyelitis of the left heel. We have been using collagen. She also has a wound on the plantar tip of her first toe 10/23; her MRI of the heel was negative for osteomyelitis but suggested a small abscess. She also had an MRI of her elbow that showed findings compatible with a septic joint. She went to see Dr. Megan Salon and she is going to have both oral and IV antibiotics which I am sure will cover any infection in the heel. I did not actually see his note today. We are using silver collagen offloading the heel with a heel offloading boot 11/6; patient continues with silver collagen to both wound areas on her plantar left heel and plantar left great toe. She remains on daptomycin by Dr. Megan Salon of infectious disease. She complains of diarrhea. Dr. Megan Salon is aware of this. 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 she has been put on oral vancomycin I think a lot of high clinical suspicion for pseudomembranous colitis Because of the right elbow there are no options to completely off her left foot  beyond the heel offloading boot we have now. Fortunately the left heel ulcer  itself has remained static some improvement in the plantar left great toe 11/20; patient arrives for review of her left heel ulcer. I also note she has a difficult time with septic arthritis of the right elbow. She currently is on IV daptomycin which seems to have helped the drainage in her elbow [MSSA]. Because of high concern for pseudomembranous colitis she was also put on vancomycin orally and Flagyl orally. She was on Levaquin but that was discontinued because of the concern about C. difficile. She states her diarrhea is better. She arrives in clinic today with a large area of denuded skin on the lateral part of the heel. This had totally separated. We have been using silver alginate to the wound areas. She arrived in a scooter telling me she is offloading the foot is much as possible. 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 Dr. Megan Salon stopped the daptomycin and now has her on vancomycin 1000 mg IV daily. This is supposed to go onto 1/18. She is being referred to [place] for what sounds like a elbow replacement type operation for her MSSA septic arthritis. She arrives in clinic today with a blister on the medial part of the wound on her heel. She still has a lot of callus around the heel although the surface of the wound on the left heel looks somewhat better. Electronic Signature(s) Signed: 10/10/2019 6:34:01 PM By: Linton Ham MD Entered By: Linton Ham on 10/10/2019 18:05:33 -------------------------------------------------------------------------------- Physical Exam Details Patient Name: Date of Service: [name] [name], [name] [name] 10/10/2019 3:15 PM Medical Record [id number] Patient Account Number:  [id number] Date of Birth/Sex: Treating RN: [birth year] (63 y.o. F) Primary Care Provider: [name] Other Clinician: Referring Provider: Treating Provider/Extender:Mahkai Fangman, Enedina Finner Weeks in Treatment: 86 Constitutional Patient is hypertensive.. Pulse regular and within target range for patient.[name] Kitchen Respirations regular, non-labored and within target range.. Temperature is normal and within the target range for the patient.[name] Kitchen Appears in no distress. Notes wound exam; the wound itself on the left plantar heel looks as though it has less depth. There is still a lot of surrounding callus and eschar. I did not remove this today on the medial aspect of the wound towards the Achilles area of the heel there was a raised fluctuation. This had purulent drainage. The abscess was evacuated with pickups and a #15 scalpel hemostasis with direct pressure specimen for culture obtained Electronic Signature(s) Signed: 10/10/2019 6:34:01 PM By: Linton Ham MD Entered By: Linton Ham on 10/10/2019 18:08:10 -------------------------------------------------------------------------------- Physician Orders Details Patient Name: Date of Service: [name] [name], [name] [name] 10/10/2019 3:15 PM Medical Record [id number] Patient Account Number: [id number] Date of Birth/Sex: Treating RN: 1956/01/06 (63 y.o. 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 Dressing Change Frequency Wound #3 Left  Calcaneus Change Dressing every other day. Wound Cleansing Wound #3 Left Calcaneus May shower and wash wound with soap and water. 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 Clearnce Sorrel Primary Care Provider: Carollee Herter, Kendrick Fries Other Clinician: Referring Provider: Treating Provider/Extender:Hershel Corkery, Enedina Finner Weeks in Treatment: 8 Active Problems ICD-10 Evaluated Encounter Code Description Active Date Today Diagnosis L97.423 Non-pressure chronic ulcer of left heel and midfoot 11/12/2018 No Yes with necrosis of muscle L97.522 Non-pressure chronic ulcer of other part of left foot 11/12/2018 No Yes with fat layer exposed E11.621 Type 2 diabetes mellitus with foot ulcer 11/12/2018 No Yes Inactive Problems Resolved Problems Electronic Signature(s) Signed: 10/10/2019 6:34:01 PM  By: Linton Ham MD Entered By: Linton Ham on 10/10/2019 18:03:27 -------------------------------------------------------------------------------- Progress Note Details Patient Name: Date of Service: [name] [name] 10/10/2019 3:15 PM Medical Record [id number] Patient Account Number: [id number] Date of Birth/Sex: Treating RN: 16-Sep-1956 (63 y.o. F) Primary Care Provider: [name] Other Clinician: Referring Provider: Treating Provider/Extender:Jordanne Elsbury, Enedina Finner Weeks in Treatment: 75 Subjective History of Present Illness (HPI) ADMISSION 11/12/2018 This is a 63 year old woman with type 2 diabetes and diabetic neuropathy. She has been dealing with a left heel plantar wound for roughly 2 years. She states this started when she pulled some skin off the area and it progressed into a wound. She also has an area on the tip of her left great toe for 1 year. She has been largely followed by Dr. Sharol Given and she had an excision of bone in the left heel in 2017. I do not see microbiology from this excision or pathology. Apparently this wound never really closed. She most recently has been using Silvadene cream. Offloading this with a scooter. The last MRI I see was in June 2018 which did not show osteomyelitis at that time. Apparently this wound is never really progressed towards healing. During her last review by Dr. Sharol Given in October it was recommended that she undergo a left BKA and she refused. She went to see a second orthopedic consult at [facility] who recommended conservative wound care to see if this will close or progress towards closure but also warned that possible surgery may be necessary. An x-ray that was done at [facility] showed an irregular calcaneal body and calcaneal tuberosity. This is probably postprocedural. Previous x-rays at [facility] had suggested heterotrophic calcifications but I do not see this. Apparently a second ointment was added to the  Silvadene which the patient thinks is because some improvement. She has not been systemically unwell. No fever or chills. She thinks the second ointment that was given to her at [facility] has helped somewhat. 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 ABIs done in June 2018 showed a ABI of 1.12 on the right and 1.1 on the left she was biphasic bilaterally. ABIs in our clinic were noncompressible today. 11/20/18 on evaluation today patient is seen for her second visit here in the office although this is actually the first visit with me concerning an issue that she's having with her great toe and heel of the left foot. Fortunately she does not appear to be have any discomfort at this time and she does have a scooter in order to offload her foot she also has a boot for offloading. With that being said this is something that appears to have been going on for some time she was seeing Dr. due to his recommendation was that she was going to require a below knee amputation. With that being said she wanted to come to the wound center but according to the patient Dr. Sharol Given told her that "we could not help her". Nonetheless she saw another provider who suggested that it may be worth a shot for [name] to try and help her out if it all possible. Nonetheless she decided that it would be worth a trial since otherwise any the way she's gonna end up with an amputation. Obviously if we can heal the wound then that will not be the case. Again I explained to her that obviously there are no guarantees but we will give this a good try and attempts to get the wound to heal. 1/30; the patient continues to have areas on the left  plantar heel and the left plantar great toe. The more worrisome area is the heel. She went for MRI on Saturday but this could not be done because she had silver alginate in the wound bed. I would like to get this rebooked. If she does not have osteomyelitis she will need a total contact cast. We have been using silver alginate in the wounds 2/7; patient has wounds on her left plantar heel and left plantar great toe. The area on the heel has some depth although it looks about the same today. Her MRI will finally be done tomorrow. If she has osteomyelitis in the heel and then we will need to consider her for IV antibiotics and hyperbaric oxygen. If the MRI is negative she will need a total contact cast although she is wearing a cam boot and using a scooter at present. Will be using silver alginate. She will take it off tomorrow in preparation for the MRI 2/14; the patient's MRI showed extensive surgical changes with a large portion of her calcaneus removed on the left secondary to her previous surgery by Dr. Sharol Given however there is no evidence of osteomyelitis. She would therefore be a candidate for a total contact cast and we applied this for the first time today 2/21; silver collagen total contact cast. The area on the plantar left great toe is "healed" still a lot of callus on this area. Dimensions on the plantar heel not too much different some epithelialization is present however. This is an improvement 12/25/18 on evaluation today patient actually is seen for follow-up concerning issues that she has been having with her cast she states she feels like it's wet and squishy in the bottom of her cast. The reason she does come in to have this  evaluated and see what is going on. Fortunately there's no signs of infection at this time was the cast was removed. 3/6; the patient's area on the tip of her right great toe is callused but there is no open area here. She still has the fairly extensive area in  the left heel. We have been using silver collagen in the wound. 01/08/19 on evaluation today patient actually appears to be doing very well in regard to her heel ulcer in my pinion to see if you shown signs of improvement which is excellent news. There's no evidence of infection. She continues to have quite a bit of drainage but fortunately again this doesn't seem to be hindering her healing. 3/20 -Patient's foot ulcer on the left appears to be doing well, the dimensions appear encouraging, we have been using total contact cast with Prisma and will continue doing that 3/27; patient continues to make nice progress on the left plantar heel. She is using silver collagen under a total contact cast. It is been a while since I have seen this wound and it really looks a lot better. Smaller with healthy granulation 4/3; she continues to make nice progress on the left plantar heel. Using silver collagen under a total contact cast 4/10; left plantar heel. Again skin over the surface of the wound with not much in the way of adherence. This results in undermining. Using silver collagen changed to silver alginate 4/17 left plantar heel. Again not much improvement. There is no undermining today no debridement was required we used silver alginate last time because of excessive moisture 4/24 left plantar heel. Again not as much improvement as I would have liked. About 3 mm of depth. Thick callused skin around the circumference. Using silver alginate 5/1; left plantar heel this is improved this week. Less depth epithelialization is present. Using silver alginate alginate under a total contact cast 5/8; left plantar heel dimensions are about the same. The depth appears to be improved. I use silver collagen starting today under the cast 5/15 left plantar heel. Arrives today with a 2-day history of feeling like something had "slipped" while walking her dog in the cast. Unfortunately extensive area relatively to the  small wound of undermining superiorly denuded epithelium. 5/22-Patient returns at 1 week after being taken off the [facility] on account of some fluid collection that was debrided and cultured at last visit from the plantar ulcer on the left heel. The culture results are polymicrobial with Klebsiella, enterococcus, Proteus growth these organisms are sensitive to Cipro except with enterococcus which is sensitive to ampicillin but patient is highly allergic to penicillin according to her. 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 5/29; the patient arrived last week with a new wound on her left plantar great toe. With regards to the culture that I did 2 weeks ago of her deteriorating heel wound this grew Klebsiella and enterococcus. She was given Cipro however the enterococcus would not be covered well by a quinolone. She is allergic to penicillin. I will give her linezolid 600 twice daily x5 days 6/12; the patient continues to have a wound on her left and after she returns to the beach there is undermining laterally. She has the new wound from this 2 weeks or so ago on the plantar tip of her left great toe. She had a fall today scraping the dorsal surface of the left fifth 7/14; READMISSION  since the patient was last here she was hospitalized from 04/15/2019 through 04/22/2019. She had presented to an outside ER after falling and hitting her elbow. She had had previous surgery on the elbow and fractures several years ago. An x-ray was negative and she was sent home. She is readmitted with sepsis and acute renal failure secondary to septic arthritis of the elbow. Cultures apparently showed pansensitive staph aureus however she has a severe beta-lactam allergy. She was treated with vancomycin. Apparently the vancomycin is completed and her PICC line is removed although she is seeing Dr. Megan Salon tomorrow due to continued  pain and swelling. In the hospital she had an IandD by Dr. [name] of orthopedics. The original surgery was on 04/17/2019 and it was felt that she had septic arthritis. She is still having a lot of pain and swelling in the right elbow and apparently is due to see Dr. Megan Salon tomorrow and what she thinks is that she will be restarted on antibiotics. With regards to her heel wounds/left foot wounds. Her arterial studies were checked and her ABIs were within normal limits. X-ray showed plantar foot ulcer negative for osteomyelitis postop resection of the posterior calcaneus. She has been using silver alginate to the heel 8/4-Patient returns after being seen on 7/14, we are using silver alginate to the calcaneal wound, she is continuing to receive care for her right elbow septic arthritis 8/14- Patient returns after 1 week, she is being seen by the surgical group for her right elbow, she is here for the left calcaneal wound for which we are using silver alginate this is about the same 8/21; this is a patient I readmitted to the clinic 5 weeks ago. She is continuing to have difficulties with a septic arthritis of the right elbow she had after a fall and apparently has had surgical IandD's since the last time we saw her. She is also following with Dr. Megan Salon of infectious disease and is apparently on 3 oral antibiotics although at the time of this dictation I am not sure what they are. She comes in today with a necrotic surface on the left great toe with a blister laterally. This was still clearly an open wound. She had purulent drainage coming out of this. The original wound on the plantar aspect of her right heel in the setting of a Charcot foot is deep not open to bone but certainly not any better at all. We have been using silver alginate 8/28; dealing with septic arthritis of the right elbow. May need to go for further surgery here in the second week of September. She had a blister on the left  great toe that was purulent [place] draining last week although a culture did not grow anything [already on antibiotics through infectious disease for her elbow]. The punched-out area on her heel is just like it was when she first came in. This almost closed with a total contact cast there are no options for that now. The patient states she cannot stay off her foot having to do housework X-ray of the foot showed a moderate bunion and severe degenerative changes at the first metatarsal phalangeal joint there was a moderate plantar calcaneal spur. We will need to check the location of this. 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 It is oral. We are using silver  collagen to the punched-out area on her left heel and to the summer superficial area of the left great toe. She is offloading this is best she can although judging by the amount of callus it is not enough. She thinks she has enough strength in her right elbow now to use her scooter. 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 Hale Bogus notes and infectious disease. We are using silver collagen to the punched-out areas on her left heel and to the plantar aspect of her left great toe she is now using her scooter to get around and to help offload these areas 10/2; 2-week follow-up. Still on clindamycin I believe for the left elbow she is going for an MRI of the elbow over the weekend. She is then going to see orthopedics and infectious disease. The area on the left heel is certainly no better. This does not probe to bone however there is green drainage. She has an area on the tip of her toe. The area on the heel is in a wound we  almost closed at one point with total contact casting. 10/9; one-week follow-up. Culture I did of the heel last week which was a swab culture admittedly showed moderate Enterococcus faecalis moderate Pseudomonas. The wound itself looks about the same. There is no palpable bone although the depth of it closely approximates bone. The heel is swollen but not erythematous. Her MRI is booked for next week. She also has an MRI of the right elbow. I've also lifted the last consult from Dr. Megan Salon who is following her for septic arthritis of the right elbow. I did not see him specifically comment on her left heel however the patient states that he is aware of this. I did not specifically address the organisms I cultured last week because of the possible effect of any additional cultures that will be done on the elbow. Additionally these were superficial not bone cultures 10/16; her MRI of the heel was put back to next week. She did have her elbow done. I have been in contact with Dr. Megan Salon about my concerns about osteomyelitis of the left heel. We have been using collagen. She also has a wound on the plantar tip of her first toe 10/23; her MRI of the heel was negative for osteomyelitis but suggested a small abscess. She also had an MRI of her elbow that showed findings compatible with a septic joint. She went to see Dr. Megan Salon and she is going to have both oral and IV antibiotics which I am sure will cover any infection in the heel. I did not actually see his note today. We are using silver collagen offloading the heel with a heel offloading boot 11/6; patient continues with silver collagen to both wound areas on her plantar left heel and plantar left great toe. She remains on daptomycin by Dr. Megan Salon of infectious disease. She complains of diarrhea. Dr. Megan Salon is aware of this. She also complains of vomiting but states that everyone is aware of this as well although there apparently the  limited options to deal with the septic arthritis in the right elbow. she has been put on oral vancomycin I think a lot of high clinical suspicion for pseudomembranous colitis Because of the right elbow there are no options to completely off her  left foot beyond the heel offloading boot we have now. Fortunately the left heel ulcer itself has remained static some improvement in the plantar left great toe 11/20; patient arrives for review of her left heel ulcer. I also note she has a difficult time with septic arthritis of the right elbow. She currently is on IV daptomycin which seems to have helped the drainage in her elbow [MSSA]. Because of high concern for pseudomembranous colitis she was also put on vancomycin orally and Flagyl orally. She was on Levaquin but that was discontinued because of the concern about C. difficile. She states her diarrhea is better. She arrives in clinic today with a large area of denuded skin on the lateral part of the heel. This had totally separated. We have been using silver alginate to the wound areas. She arrived in a scooter telling me she is offloading the foot is much as possible. 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 Dr. Megan Salon stopped the daptomycin and now has her on vancomycin 1000 mg IV daily. This is supposed to go onto 1/18. She is being referred to [place] for what sounds like a elbow replacement type operation for her MSSA septic arthritis. She arrives in clinic today with a blister on the medial part of the wound on her heel. She still has a lot of callus around the heel although the surface of the wound on the left heel looks somewhat better. Objective Constitutional Patient is hypertensive.. Pulse regular and within target range for patient.[name] Kitchen  Respirations regular, non-labored and within target range.. Temperature is normal and within the target range for the patient.[name] Kitchen Appears in no distress. Vitals Time Taken: 4:07 PM, Height: 62 in, Weight: 180 lbs, BMI: 32.9, Temperature: 98.9 F, Pulse: 75 bpm, Respiratory Rate: 18 breaths/min, Blood Pressure: 99/74 mmHg. General Notes: wound exam; the wound itself on the left plantar heel looks as though it has less depth. There is still a lot of surrounding callus and eschar. I did not remove this today on the medial aspect of the wound towards the Achilles area of the heel there was a raised fluctuation. This had purulent drainage. The abscess was evacuated with pickups and a #15 scalpel hemostasis with direct pressure specimen for culture obtained Integumentary (Hair, Skin) Wound #3 status is Open. Original cause of wound was Trauma. The wound is located on the Left Calcaneus. The wound measures 2.5cm length x 2.2cm width x 0.3cm depth; 4.32cm^2 area and 1.296cm^3 volume. There is Fat Layer (Subcutaneous Tissue) Exposed exposed. There is no tunneling or undermining noted. There is a medium amount of serosanguineous drainage noted. The wound margin is flat and intact. There is large (67-100%) pink granulation within the wound bed. There is a small (1-33%) amount of necrotic tissue within the wound bed including Adherent Slough. Wound #6 status is Healed - Epithelialized. Original cause of wound was Blister. The wound is located on the Left Toe Great. The wound measures 0cm length x 0cm width x 0cm depth; 0cm^2 area and 0cm^3 volume. There is no tunneling or undermining noted. There is a none present amount of drainage noted. The wound margin is flat and intact. There is no granulation within the wound bed. There is no necrotic tissue within the wound bed. 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 There was a Excisional Skin/Subcutaneous Tissue Debridement with a total area of 4 sq cm performed by Ricard Dillon., MD. With the following instrument(s): Blade, and Forceps to remove Viable and Non-Viable tissue/material. Material removed includes Subcutaneous Tissue and Skin: Epidermis and after achieving pain control using Other (benzocaine, 20%). 1 specimen was taken by a Swab and sent to the lab per facility protocol. A time out was conducted at 16:55, prior to the start of the procedure. A Minimum amount of bleeding was controlled with Pressure. The procedure was tolerated well with a pain level of 0 throughout and a pain level of 0 following the procedure. Post Debridement Measurements: 2.5cm length x 2.2cm width x 0.3cm depth; 1.296cm^3 volume. Character of Wound/Ulcer Post Debridement requires further debridement. Severity of Tissue Post Debridement is: Fat layer exposed. Post procedure Diagnosis Wound #3: Same as Pre-Procedure Plan Follow-up Appointments: Return appointment in 3 weeks. Dressing Change Frequency: Wound #3 Left Calcaneus: Change Dressing every other day. Wound Cleansing: Wound #3 Left Calcaneus: May shower and wash wound with soap and water. Primary Wound Dressing: Wound #3 Left Calcaneus: Calcium Alginate with Silver Secondary Dressing: Wound #3 Left Calcaneus: Kerlix/Rolled Gauze Dry Gauze Other: - felt callous pad Edema Control: Elevate legs to the level of the heart or above for 30 minutes daily and/or when sitting, a frequency of: - throughout the day Off-Loading: Other: - use knee scooter or motorized chair , minimal weight bearing left foot Laboratory ordered  were: Anerobic culture - Left calcaneous wound 1. I continued with the silver alginate to the heel and the new area of abscess that was evacuated. 2. Specimen for CandS obtained although I suspect this will be MSSA again 3. The patient is on vancomycin instead of daptomycin she feels better her diarrhea has resolved Electronic Signature(s) Signed: 10/10/2019 6:34:01 PM By: Linton Ham MD Entered By: Linton Ham on 10/10/2019 18:09:22 -------------------------------------------------------------------------------- SuperBill Details Patient Name: Date of Service: [name] [name] 10/10/2019 Medical Record [id number] Patient Account Number: [id number] Date of Birth/Sex: Treating RN: [birth year] (63 y.o. 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 L97.522 Non-pressure chronic ulcer of other part of left foot with fat layer exposed E11.621 Type 2 diabetes mellitus with foot ulcer Facility Procedures CPT4 Code Description: [id number] 11042 - DEB SUBQ TISSUE 20 SQ CM/< ICD-10 Diagnosis Description L97.423 Non-pressure chronic ulcer of left heel and midfoot with Modifier: necrosis of Quantity: 1 muscle Physician Procedures CPT4 Code Description: [id number] - WC PHYS SUBQ TISS 20 SQ CM ICD-10 Diagnosis Description L97.423 Non-pressure chronic ulcer of left heel and midfoot with Modifier: necrosis of Quantity: 1 muscle Electronic Signature(s) Signed: 10/10/2019 6:34:01 PM By: Linton Ham MD Entered By: Linton Ham on 10/10/2019 18:09:40

## 2019-10-13 NOTE — Progress Notes (Signed)
This encounter was created in error - please disregard.

## 2019-10-13 NOTE — Progress Notes (Signed)
TENEKA, MALMBERG ([id number]) Visit Report for 10/10/2019 Arrival Information Details Patient Name: Date of Service: KEEGHAN, MCINTIRE 10/10/2019 3:15 PM Medical Record [id number] Patient Account Number: [id number] Date of Birth/Sex: Treating RN: 1956-09-18 (63 y.o. Orvan Falconer Primary Care Reef Achterberg: [name] Other Clinician: Referring Maricarmen Braziel: Treating Juana Haralson/Extender:Robson, Grafton Folk in Treatment: 2 Visit Information History Since Last Visit All ordered tests and consults were completed: No Patient Arrived: Other Added or deleted any medications: No Arrival Time: 16:06 Any new allergies or adverse reactions: No Accompanied By: self Had a fall or experienced change in No Transfer Assistance: None activities of daily living that may affect Patient Identification Verified: Yes risk of falls: Secondary Verification Process Yes Signs or symptoms of abuse/neglect since last No Completed: visito Patient Requires Transmission- No Hospitalized since last visit: No Based Precautions: Implantable device outside of the clinic excluding No Patient Has Alerts: Yes cellular tissue based products placed in the center Patient Alerts: R ABI = 1.12, L since last visit: ABI = 1.1 Has Dressing in Place as Prescribed: Yes Pain Present Now: No Electronic Signature(s) Signed: 10/10/2019 5:42:46 PM By: Carlene Coria RN Entered By: Carlene Coria on 10/10/2019 16:07:21 -------------------------------------------------------------------------------- Encounter Discharge Information Details Patient Name: Date of Service: [name]. 10/10/2019 3:15 PM Medical Record [id number] Patient Account Number: [id number] Date of Birth/Sex: Treating RN: Mar 02, 1956 (63 y.o. [name] Primary Care Everette Dimauro: Carollee Herter, Kendrick Fries Other Clinician: Referring Lanaysia Fritchman: Treating Rutha Melgoza/Extender:Robson, Grafton Folk in  Treatment: 58 Encounter Discharge Information Items Post Procedure Vitals Discharge Condition: Stable Temperature (F): 98.9 Ambulatory Status: Wheelchair Pulse (bpm): 75 Discharge Destination: Home Respiratory Rate (breaths/min): 18 Transportation: Private Auto Blood Pressure (mmHg): 99/74 Accompanied By: self Schedule Follow-up Appointment: Yes Clinical Summary of Care: Electronic Signature(s) Signed: 10/10/2019 6:25:32 PM By: Deon Pilling Entered By: Deon Pilling on 10/10/2019 17:41:04 -------------------------------------------------------------------------------- Lower Extremity Assessment Details Patient Name: Date of Service: TERRILYNN, POSTELL 10/10/2019 3:15 PM Medical Record [id number] Patient Account Number: [id number] Date of Birth/Sex: Treating RN: Jan 25, 1956 (63 y.o. Orvan Falconer Primary Care Paul Trettin: Carollee Herter, Kendrick Fries Other Clinician: Referring Alverda Nazzaro: Treating Taurus Alamo/Extender:Robson, Tennis Ship, Tora Kindred in Treatment: 47 Edema Assessment Assessed: [Left: No] [Right: No] Edema: [Left: N] [Right: o] Calf Left: Right: Point of Measurement: 28 cm From Medial Instep 35.4 cm cm Ankle Left: Right: Point of Measurement: 10 cm From Medial Instep 21.5 cm cm Electronic Signature(s) Signed: 10/10/2019 5:42:46 PM By: Carlene Coria RN Entered By: Carlene Coria on 10/10/2019 16:14:08 -------------------------------------------------------------------------------- Multi Wound Chart Details Patient Name: Date of Service: [name]. 10/10/2019 3:15 PM Medical Record [id number] Patient Account Number: [id number] Date of Birth/Sex: Treating RN: [birth year] (63 y.o. 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] [6:Lower Extremity] Comorbid History: [3:Cataracts, Anemia, Coronary Artery Disease, Coronary Artery Disease, Hypertension, Myocardial Hypertension, Myocardial Infarction, Type II Diabetes, Infarction, Type II Diabetes, Osteoarthritis, Osteomyelitis, Neuropathy  Osteomyelitis, Neuropathy] [6:Cataracts, Anemia, Osteoarthritis,] [N/A:N/A] Date Acquired: [3:11/12/2016] [6:06/19/2019] [N/A:N/A] Weeks of Treatment: [3:47] [6:16] [N/A:N/A] Wound Status: [3:Open] [6:Healed - Epithelialized] [N/A:N/A] Measurements L x W x D 2.5x2.2x0.3 [6:0x0x0] [N/A:N/A] (cm)  Area (cm) : [3:4.32] [6:0] [N/A:N/A] Volume (cm) : [3:1.296] [6:0] [N/A:N/A] % Reduction in Area: [3:19.30%] [6:100.00%] [N/A:N/A] % Reduction in Volume: 79.80% [6:100.00%] [N/A:N/A] Classification: [3:Grade 2] [6:Grade 2] [N/A:N/A] Exudate Amount: [3:Medium] [6:None Present] [N/A:N/A] Exudate Type: [3:Serosanguineous] [6:N/A] [N/A:N/A] Exudate Color: [3:red, brown] [6:N/A] [N/A:N/A] Wound Margin: [3:Flat and Intact] [6:Flat and Intact] [N/A:N/A] Granulation Amount: [3:Large (67-100%)] [6:None Present (0%)] [N/A:N/A] Granulation Quality: [3:Pink] [6:N/A] [N/A:N/A] Necrotic Amount: [3:Small (1-33%)] [6:None Present (0%)] [N/A:N/A] Exposed Structures: [3:Fat Layer (Subcutaneous Fascia: No Tissue) Exposed: Yes Fascia: No Tendon: No Muscle: No Joint: No Bone: No] [6:Fat Layer (Subcutaneous Tissue) Exposed: No Tendon: No Muscle: No Joint: No Bone: No] [N/A:N/A] Epithelialization: [3:Small (1-33%)] [6:Large (67-100%)] [N/A:N/A] Debridement: [3:Debridement - Excisional N/A] [N/A:N/A] Pre-procedure [3:16:55] [6:N/A]  [N/A:N/A] Verification/Time Out Taken: Pain Control: [3:Other] [6:N/A] [N/A:N/A] Tissue Debrided: [3:Subcutaneous] [6:N/A] [N/A:N/A] Level: [3:Skin/Subcutaneous Tissue N/A] [N/A:N/A] Debridement Area (sq cm):4 [6:N/A] [N/A:N/A] Instrument: [3:Blade, Forceps] [6:N/A] [N/A:N/A] Specimen: [3:Swab] [6:N/A] [N/A:N/A] Number of Specimens [3:1] [6:N/A] [N/A:N/A] Taken: Bleeding: [3:Minimum] [6:N/A] [N/A:N/A] Hemostasis Achieved: [3:Pressure] [6:N/A] [N/A:N/A] Procedural Pain: [3:0] [6:N/A] [N/A:N/A] Post Procedural Pain: [3:0] [6:N/A] [N/A:N/A] Debridement Treatment [3:Procedure was tolerated] [6:N/A] [N/A:N/A] Response: [3:well] Post Debridement [3:2.5x2.2x0.3] [6:N/A] [N/A:N/A] Measurements L x W x D (cm) Post Debridement [3:1.296] [6:N/A] [N/A:N/A] Volume: (cm) Procedures Performed: [3:Debridement] [6:N/A] [N/A:N/A] Treatment Notes Wound #3 (Left Calcaneus) 1. Cleanse With Wound Cleanser Soap and water 2. Periwound Care TCA Cream 3. Primary Dressing Applied Calcium Alginate Ag 4. Secondary Dressing ABD Pad Dry Gauze Roll Gauze 5. Secured With Tape 7. Footwear/Offloading device applied Felt/Foam Notes netting. Electronic Signature(s) Signed: 10/10/2019 6:34:01 PM By: Linton Ham MD Entered By: Linton Ham on 10/10/2019 18:03:40 -------------------------------------------------------------------------------- Multi-Disciplinary Care Plan Details Patient Name: Date of Service: ANNALEI, FRIESZ 10/10/2019 3:15 PM Medical Record [id number] Patient Account Number: [id number] Date of Birth/Sex: Treating RN: 08-Sep-1956 (63 y.o. Clearnce Sorrel Primary Care Abia Monaco: Carollee Herter, Kendrick Fries Other Clinician: Referring Mirca Yale: Treating Azlynn Mitnick/Extender:Robson, Grafton Folk in Treatment: 51 Active Inactive Abuse / Safety / Falls / Self Care Management Nursing Diagnoses: Potential for injury related to falls Goals: Patient/caregiver  will verbalize understanding of skin care regimen Date Initiated: 11/12/2018 Target Resolution Date: 11/07/2019 Goal Status: Active Interventions: Assess Activities of Daily Living upon admission and as needed Assess fall risk on admission and as needed Assess: immobility, friction, shearing, incontinence upon admission and as needed Assess impairment of mobility on admission and as needed per policy Assess personal safety and home safety (as indicated) on admission and as needed Assess self care needs on admission and as needed Notes: Wound/Skin Impairment Nursing Diagnoses: Knowledge deficit related to ulceration/compromised skin integrity Goals: Patient/caregiver will verbalize understanding of skin care regimen Date Initiated: 04/11/2019 Target Resolution Date: 11/07/2019 Goal Status: Active Ulcer/skin breakdown will have a volume reduction of 30% by week 4 Date Initiated: 11/12/2018 Date Inactivated: 01/17/2019 Target Resolution Date: 01/11/2019 Goal Status: Met Ulcer/skin breakdown will have a volume reduction of 50% by week 8 Date Initiated: 01/17/2019 Date Inactivated: 02/14/2019 Target Resolution Date: 02/14/2019 Goal Status: Met Ulcer/skin breakdown will have a volume reduction of 80% by week 12 Date Initiated: 02/14/2019 Date Inactivated: 03/21/2019 Target Resolution Date: 03/14/2019 Unmet Reason: infection in Goal Status: Unmet wound Interventions: Assess patient/caregiver ability to obtain necessary supplies Assess patient/caregiver ability to perform ulcer/skin care regimen upon admission and as needed Assess ulceration(s) every visit Notes: Electronic Signature(s) Signed: 10/13/2019 5:56:42 PM By: Kela Millin Entered By: Kela Millin on 10/10/2019 16:44:26 -------------------------------------------------------------------------------- Pain Assessment Details Patient  Name: Date of Service: TIMMY, CLEVERLY 10/10/2019 3:15 PM Medical Record [id number]  Patient Account Number: [id number] Date of Birth/Sex: Treating RN: Nov 03, 1955 (63 y.o. Orvan Falconer Primary Care Briel Gallicchio: Carollee Herter, Kendrick Fries Other Clinician: Referring Skip Litke: Treating Sadik Piascik/Extender:Robson, Enedina Finner Weeks in Treatment: 36 Active Problems Location of Pain Severity and Description of Pain Patient Has Paino No Site Locations Pain Management and Medication Current Pain Management: Electronic Signature(s) Signed: 10/10/2019 5:42:46 PM By: Carlene Coria RN Entered By: Carlene Coria on 10/10/2019 16:08:03 -------------------------------------------------------------------------------- Patient/Caregiver Education Details Patient Name: [name] 12/11/2020andnbsp3:15 Date of Service: PM Medical Record [id number] Number: Patient Account Number: [id number] Date of Birth/Gender: 1955/11/06 (63 y.o. F) Treating RN: Kela Millin Other Clinician: Carollee Herter, Primary Care Physician: Antionette Poles Physician/Extender: Carollee Herter, Referring Physician: Tora Kindred in Treatment: 25 Education Assessment Education Provided To: Patient Education Topics Provided Wound/Skin Impairment: Methods: Explain/Verbal Responses: State content correctly Electronic Signature(s) Signed: 10/13/2019 5:56:42 PM By: Kela Millin Entered By: Kela Millin on 10/10/2019 16:44:37 -------------------------------------------------------------------------------- Wound Assessment Details Patient Name: Date of Service: LYLIAN, SANAGUSTIN 10/10/2019 3:15 PM Medical Record [id number] Patient Account Number: [id number] Date of Birth/Sex: Treating RN: 01-04-1956 (63 y.o. Orvan Falconer Primary Care Corgan Mormile: Carollee Herter, Kendrick Fries Other Clinician: Referring Hercules Hasler: Treating Colon Rueth/Extender:Robson, Grafton Folk in Treatment: 47 Wound Status Wound Number: 3 Primary Diabetic Wound/Ulcer of the Lower  Extremity Etiology: Wound Location: Left Calcaneus Wound Open Wounding Event: Trauma Status: Date Acquired: 11/12/2016 Comorbid Cataracts, Anemia, Coronary Artery Disease, Weeks Of Treatment: 47 History: Hypertension, Myocardial Infarction, Type II Clustered Wound: No Diabetes, Osteoarthritis, Osteomyelitis, Neuropathy Photos Wound Measurements Length: (cm) 2.5 Width: (cm) 2.2 Depth: (cm) 0.3 Area: (cm) 4.32 Volume: (cm) 1.296 Wound Description Classification: Grade 2 Wound Margin: Flat and Intact Exudate Amount: Medium Exudate Type: Serosanguineous Exudate Color: red, brown Wound Bed Granulation Amount: Large (67-100%) Granulation Quality: Pink Necrotic Amount: Small (1-33%) Necrotic Quality: Adherent Slough fter Cleansing: No ino Yes Exposed Structure sed: No (Subcutaneous Tissue) Exposed: Yes posed: No posed: No osed: No sed: No % Reduction in Area: 19.3% % Reduction in Volume: 79.8% Epithelialization: Small (1-33%) Tunneling: No Undermining: No Foul Odor A Slough/Fibr Fascia Expo Fat Layer Tendon Ex Muscle Ex Joint Exp Bone Expo Treatment Notes Wound #3 (Left Calcaneus) 1. Cleanse With Wound Cleanser Soap and water 2. Periwound Care TCA Cream 3. Primary Dressing Applied Calcium Alginate Ag 4. Secondary Dressing ABD Pad Dry Gauze Roll Gauze 5. Secured With Tape 7. Footwear/Offloading device applied Felt/Foam Notes netting. Electronic Signature(s) Signed: 10/13/2019 3:44:56 PM By: Mikeal Hawthorne EMT/HBOT Signed: 10/13/2019 5:55:53 PM By: Carlene Coria RN Previous Signature: 10/10/2019 5:42:46 PM Version By: Carlene Coria RN Entered By: Mikeal Hawthorne on 10/13/2019 11:58:08 -------------------------------------------------------------------------------- Wound Assessment Details Patient Name: Date of Service: TERILYNN, BURESH 10/10/2019 3:15 PM Medical Record [id number] Patient Account Number: [id number] Date of  Birth/Sex: Treating RN: 1955-12-26 (63 y.o. Orvan Falconer Primary Care Emmalin Jaquess: Carollee Herter, Kendrick Fries Other Clinician: Referring Jalynn Betzold: Treating Lenah Messenger/Extender:Robson, Grafton Folk in Treatment: 47 Wound Status Wound Number: 6 Primary Diabetic Wound/Ulcer of the Lower Extremity Etiology: Wound Location: Left Toe Great Wound Healed - Epithelialized Wounding Event: Blister Status: Date Acquired: 06/19/2019 Comorbid Cataracts, Anemia, Coronary Artery Disease, Weeks Of Treatment: 16 History: Hypertension, Myocardial Infarction, Type II Clustered Wound: No Diabetes, Osteoarthritis, Osteomyelitis, Neuropathy Photos Wound Measurements Length: (cm) 0 % Reducti Width: (cm) 0 % Reducti Depth: (cm) 0 Epithelia Area: (cm) 0 Tunnelin Volume: (cm) 0 Undermin  Wound Description Classification: Grade 2 Foul Odor Wound Margin: Flat and Intact Slough/Fi Exudate Amount: None Present Wound Bed Granulation Amount: None Present (0%) Necrotic Amount: None Present (0%) Fascia Ex Fat Layer Tendon Ex Muscle Ex Joint Exp Bone Expo After Cleansing: No brino No Exposed Structure posed: No (Subcutaneous Tissue) Exposed: No posed: No posed: No osed: No sed: No on in Area: 100% on in Volume: 100% lization: Large (67-100%) g: No ing: No Electronic Signature(s) Signed: 10/13/2019 3:44:56 PM By: Mikeal Hawthorne EMT/HBOT Signed: 10/13/2019 5:55:53 PM By: Carlene Coria RN Previous Signature: 10/10/2019 5:42:46 PM Version By: Carlene Coria RN Entered By: Mikeal Hawthorne on 10/13/2019 11:58:40 -------------------------------------------------------------------------------- Vitals Details Patient Name: Date of Service: [name]. 10/10/2019 3:15 PM Medical Record [id number] Patient Account Number: [id number] Date of Birth/Sex: Treating RN: 12/01/1955 (63 y.o. Orvan Falconer Primary Care Tayjon Halladay: Carollee Herter, Kendrick Fries Other Clinician: Referring Lamontae Ricardo:  Treating Emrik Erhard/Extender:Robson, Grafton Folk in Treatment: 9 Vital Signs Time Taken: 16:07 Temperature (F): 98.9 Height (in): 62 Pulse (bpm): 75 Weight (lbs): 180 Respiratory Rate (breaths/min): 18 Body Mass Index (BMI): 32.9 Blood Pressure (mmHg): 99/74 Reference Range: 80 - 120 mg / dl Electronic Signature(s) Signed: 10/10/2019 5:42:46 PM By: Carlene Coria RN Entered By: Carlene Coria on 10/10/2019 16:07:54

## 2019-10-14 ENCOUNTER — Encounter: Admit: 2019-10-14 | Discharge: 2019-10-14 | Payer: MEDICARE

## 2019-10-14 ENCOUNTER — Ambulatory Visit: Admit: 2019-10-14 | Discharge: 2019-10-15 | Payer: MEDICARE

## 2019-10-14 DIAGNOSIS — G9050 Complex regional pain syndrome I, unspecified: Secondary | ICD-10-CM

## 2019-10-14 DIAGNOSIS — K5900 Constipation, unspecified: Secondary | ICD-10-CM

## 2019-10-14 DIAGNOSIS — K2970 Gastritis, unspecified, without bleeding: Secondary | ICD-10-CM

## 2019-10-14 DIAGNOSIS — E119 Type 2 diabetes mellitus without complications: Secondary | ICD-10-CM

## 2019-10-14 DIAGNOSIS — J45909 Unspecified asthma, uncomplicated: Secondary | ICD-10-CM

## 2019-10-14 DIAGNOSIS — I10 Essential (primary) hypertension: Secondary | ICD-10-CM

## 2019-10-14 DIAGNOSIS — G5600 Carpal tunnel syndrome, unspecified upper limb: Secondary | ICD-10-CM

## 2019-10-14 DIAGNOSIS — E559 Vitamin D deficiency, unspecified: Secondary | ICD-10-CM

## 2019-10-14 DIAGNOSIS — M79642 Pain in left hand: Secondary | ICD-10-CM

## 2019-10-14 DIAGNOSIS — E785 Hyperlipidemia, unspecified: Secondary | ICD-10-CM

## 2019-10-14 DIAGNOSIS — D649 Anemia, unspecified: Secondary | ICD-10-CM

## 2019-10-14 DIAGNOSIS — K589 Irritable bowel syndrome without diarrhea: Secondary | ICD-10-CM

## 2019-10-14 DIAGNOSIS — J309 Allergic rhinitis, unspecified: Secondary | ICD-10-CM

## 2019-10-14 LAB — AEROBIC CULTURE W GRAM STAIN (SUPERFICIAL SPECIMEN): Gram Stain: NONE SEEN

## 2019-10-14 MED ORDER — FERRIC CARBOXYMALTOSE IVPB
750 mg | Freq: Once | INTRAVENOUS | 0 refills | Status: CN
Start: 2019-10-14 — End: ?

## 2019-10-14 MED ORDER — FERRIC CARBOXYMALTOSE IVPB
750 mg | Freq: Once | INTRAVENOUS | 0 refills | Status: CP
Start: 2019-10-14 — End: ?
  Administered 2019-10-14 (×2): 750 mg via INTRAVENOUS

## 2019-10-15 DIAGNOSIS — D509 Iron deficiency anemia, unspecified: Principal | ICD-10-CM

## 2019-10-16 ENCOUNTER — Telehealth: Payer: Self-pay | Admitting: *Deleted

## 2019-10-16 ENCOUNTER — Other Ambulatory Visit (HOSPITAL_COMMUNITY)
Admit: 2019-10-16 | Discharge: 2019-10-16 | Disposition: A | Discharge status: Home / Self Care | Payer: PRIVATE HEALTH INSURANCE | Source: Ambulatory Visit | Attending: Internal Medicine | Admitting: Internal Medicine

## 2019-10-16 DIAGNOSIS — N390 Urinary tract infection, site not specified: Secondary | ICD-10-CM | POA: Insufficient documentation

## 2019-10-16 DIAGNOSIS — M0009 Staphylococcal polyarthritis: Secondary | ICD-10-CM | POA: Insufficient documentation

## 2019-10-16 LAB — BASIC METABOLIC PANEL
Anion gap: 10 (ref 5–15)
BUN: 25 mg/dL — ABNORMAL HIGH (ref 8–23)
CO2: 25 mmol/L (ref 22–32)
Calcium: 9 mg/dL (ref 8.9–10.3)
Chloride: 98 mmol/L (ref 98–111)
Creatinine, Ser: 1.65 mg/dL — ABNORMAL HIGH (ref 0.44–1.00)
GFR calc Af Amer: 38 mL/min — ABNORMAL LOW (ref >60)
GFR calc non Af Amer: 33 mL/min — ABNORMAL LOW (ref >60)
Glucose, Bld: 520 mg/dL (ref 70–99)
Potassium: 5 mmol/L (ref 3.5–5.1)
Sodium: 133 mmol/L — ABNORMAL LOW (ref 135–145)

## 2019-10-16 LAB — CK: Total CK: 64 U/L (ref 38–234)

## 2019-10-16 LAB — CBC
HCT: 37.7 % (ref 36.0–46.0)
Hemoglobin: 11.9 g/dL — ABNORMAL LOW (ref 12.0–15.0)
MCH: 27.6 pg (ref 26.0–34.0)
MCHC: 31.6 g/dL (ref 30.0–36.0)
MCV: 87.5 fL (ref 80.0–100.0)
Platelets: 123 10*3/uL — ABNORMAL LOW (ref 150–400)
RBC: 4.31 MIL/uL (ref 3.87–5.11)
RDW: 15.9 % — ABNORMAL HIGH (ref 11.5–15.5)
WBC: 5.3 10*3/uL (ref 4.0–10.5)
nRBC: 0 % (ref 0.0–0.2)

## 2019-10-16 LAB — VANCOMYCIN, TROUGH: Vancomycin Tr: 12 ug/mL — ABNORMAL LOW (ref 15–20)

## 2019-10-16 NOTE — Telephone Encounter (Signed)
Kat at [facility] care called to report critical lab value of glucose = 520, drawn 10/16/19 at 11:30am.  Per Wendelyn Breslow, patient is alert, feeling well.  [facility] has not yet alerted PCP or wound care, they will do so. Aicha has not been taking her injectable insulin due to the cost of needles. She has been out of needles for over 1 month.  Her diabetic supplies are $370+/month. She often has difficulties procuring her supplies, has had issues for several years. At this time, she is also having difficulty affording food, stating she has been turned away from different agencies because her husband is employed.  She does not want to reach out further, because she states that there are people who have greater needs than she does. RN encouraged her that a big piece of her wound healing would be nutrition and diabetic control, that antibiotics can only do so much without the other pieces.  RN encouraged her to let her diabetic doctor know what is going on to see if there are any resources they can connect her to.  Landis Gandy, RN

## 2019-10-16 NOTE — Telephone Encounter (Signed)
Pt sees endo-- she needs to call them

## 2019-10-20 ENCOUNTER — Other Ambulatory Visit: Payer: Self-pay | Admitting: Family Medicine

## 2019-10-20 DIAGNOSIS — S42401A Unspecified fracture of lower end of right humerus, initial encounter for closed fracture: Secondary | ICD-10-CM

## 2019-10-20 DIAGNOSIS — M797 Fibromyalgia: Secondary | ICD-10-CM

## 2019-10-20 DIAGNOSIS — M79672 Pain in left foot: Secondary | ICD-10-CM

## 2019-10-20 MED ORDER — HYDROCODONE-ACETAMINOPHEN 5-325 MG PO TABS: 1.0000 | ORAL_TABLET | Freq: Four times a day (QID) | ORAL | 0 refills | Status: DC | PRN

## 2019-10-20 NOTE — Telephone Encounter (Signed)
Requesting: NORCO Contract:  UDS: 05/06/2018, low risk Last OV: 09/18/2019 Next OV: N/A Last Refill: 09/18/2019, #120--0 RF Database:   Please advise

## 2019-10-20 NOTE — Telephone Encounter (Signed)
HYDROcodone-acetaminophen (NORCO/VICODIN) 5-325 MG tablet    Patient is requesting refill.    Pharmacy:  [id number] - [place], [place] - [address] AT [place] Phone:  [phone]  Fax:  [phone]

## 2019-10-21 ENCOUNTER — Other Ambulatory Visit: Payer: Self-pay | Admitting: Family Medicine

## 2019-10-21 ENCOUNTER — Other Ambulatory Visit (HOSPITAL_COMMUNITY)
Admission: RE | Admit: 2019-10-21 | Discharge: 2019-10-21 | Disposition: A | Discharge status: Home / Self Care | Payer: PRIVATE HEALTH INSURANCE | Source: Ambulatory Visit | Attending: Internal Medicine | Admitting: Internal Medicine

## 2019-10-21 ENCOUNTER — Ambulatory Visit: Payer: PRIVATE HEALTH INSURANCE | Admitting: Internal Medicine

## 2019-10-21 DIAGNOSIS — M0009 Staphylococcal polyarthritis: Secondary | ICD-10-CM | POA: Diagnosis not present

## 2019-10-21 DIAGNOSIS — N390 Urinary tract infection, site not specified: Secondary | ICD-10-CM | POA: Diagnosis present

## 2019-10-21 DIAGNOSIS — M797 Fibromyalgia: Secondary | ICD-10-CM

## 2019-10-21 DIAGNOSIS — S42401A Unspecified fracture of lower end of right humerus, initial encounter for closed fracture: Secondary | ICD-10-CM

## 2019-10-21 DIAGNOSIS — M79672 Pain in left foot: Secondary | ICD-10-CM

## 2019-10-21 LAB — BASIC METABOLIC PANEL
Anion gap: 11 (ref 5–15)
BUN: 25 mg/dL — ABNORMAL HIGH (ref 8–23)
CO2: 20 mmol/L — ABNORMAL LOW (ref 22–32)
Calcium: 9.1 mg/dL (ref 8.9–10.3)
Chloride: 101 mmol/L (ref 98–111)
Creatinine, Ser: 1.56 mg/dL — ABNORMAL HIGH (ref 0.44–1.00)
GFR calc Af Amer: 41 mL/min — ABNORMAL LOW (ref >60)
GFR calc non Af Amer: 35 mL/min — ABNORMAL LOW (ref >60)
Glucose, Bld: 495 mg/dL — ABNORMAL HIGH (ref 70–99)
Potassium: 4.6 mmol/L (ref 3.5–5.1)
Sodium: 132 mmol/L — ABNORMAL LOW (ref 135–145)

## 2019-10-21 LAB — CBC
HCT: 34 % — ABNORMAL LOW (ref 36.0–46.0)
Hemoglobin: 10.9 g/dL — ABNORMAL LOW (ref 12.0–15.0)
MCH: 27.8 pg (ref 26.0–34.0)
MCHC: 32.1 g/dL (ref 30.0–36.0)
MCV: 86.7 fL (ref 80.0–100.0)
Platelets: 128 10*3/uL — ABNORMAL LOW (ref 150–400)
RBC: 3.92 MIL/uL (ref 3.87–5.11)
RDW: 15.9 % — ABNORMAL HIGH (ref 11.5–15.5)
WBC: 5.8 10*3/uL (ref 4.0–10.5)
nRBC: 0 % (ref 0.0–0.2)

## 2019-10-21 LAB — SEDIMENTATION RATE: Sed Rate: 48 mm/hr — ABNORMAL HIGH (ref 0–22)

## 2019-10-21 LAB — C-REACTIVE PROTEIN: CRP: 1.4 mg/dL — ABNORMAL HIGH (ref <1.0)

## 2019-10-21 LAB — CK: Total CK: 113 U/L (ref 38–234)

## 2019-10-21 LAB — VANCOMYCIN, TROUGH: Vancomycin Tr: 7 ug/mL — ABNORMAL LOW (ref 15–20)

## 2019-10-21 MED ORDER — HYDROCODONE-ACETAMINOPHEN 5-325 MG PO TABS: 1.0000 | ORAL_TABLET | Freq: Four times a day (QID) | ORAL | 0 refills | Status: DC | PRN

## 2019-10-21 NOTE — Telephone Encounter (Signed)
Pt says that she received a Rx for HYDROcodone-acetaminophen (NORCO/VICODIN) 5-325 MG tablet that was sent to the incorrect pharmacy. Pt would like to have Rx sent to  [id number] - [place], [place] - [address] AT [address] Phone:  [phone]  Fax:  [phone]     Instead.    Pt would like to pick up today at correct pharmacy if possible.

## 2019-10-21 NOTE — Telephone Encounter (Signed)
Please see request below. Pt would like prescription to go to [facility]. Prescription was previously sent to [facility] on 10/20/19.

## 2019-10-21 NOTE — Telephone Encounter (Signed)
Medication Refill - Medication: HYDROcodone-acetaminophen (NORCO/VICODIN) 5-325 MG tablet [[id number]    Preferred Pharmacy (with phone number or street name):  [facility] #[id number] - [place], [place] - [address]  [address] [place] [address]  Phone: [phone] Fax: [phone]    Agent: Please be advised that RX refills may take up to 3 business days. We ask that you follow-up with your pharmacy.

## 2019-10-21 NOTE — Telephone Encounter (Signed)
Patient husband is calling checking status of medication.  Patient husband states she has to have this medication before the [facility].   Call back [phone]

## 2019-10-25 ENCOUNTER — Other Ambulatory Visit (HOSPITAL_COMMUNITY)
Admission: RE | Admit: 2019-10-25 | Discharge: 2019-10-25 | Disposition: A | Discharge status: Home / Self Care | Payer: PRIVATE HEALTH INSURANCE | Source: Other Acute Inpatient Hospital | Attending: Internal Medicine | Admitting: Internal Medicine

## 2019-10-25 DIAGNOSIS — N390 Urinary tract infection, site not specified: Secondary | ICD-10-CM | POA: Insufficient documentation

## 2019-10-25 DIAGNOSIS — M0009 Staphylococcal polyarthritis: Secondary | ICD-10-CM | POA: Insufficient documentation

## 2019-10-25 LAB — BASIC METABOLIC PANEL
Anion gap: 9 (ref 5–15)
BUN: 25 mg/dL — ABNORMAL HIGH (ref 8–23)
CO2: 23 mmol/L (ref 22–32)
Calcium: 9 mg/dL (ref 8.9–10.3)
Chloride: 105 mmol/L (ref 98–111)
Creatinine, Ser: 1.48 mg/dL — ABNORMAL HIGH (ref 0.44–1.00)
GFR calc Af Amer: 43 mL/min — ABNORMAL LOW (ref >60)
GFR calc non Af Amer: 37 mL/min — ABNORMAL LOW (ref >60)
Glucose, Bld: 326 mg/dL — ABNORMAL HIGH (ref 70–99)
Potassium: 5 mmol/L (ref 3.5–5.1)
Sodium: 137 mmol/L (ref 135–145)

## 2019-10-25 LAB — CBC WITH DIFFERENTIAL/PLATELET
Abs Immature Granulocytes: 0.02 10*3/uL (ref 0.00–0.07)
Basophils Absolute: 0 10*3/uL (ref 0.0–0.1)
Basophils Relative: 1 %
Eosinophils Absolute: 0.1 10*3/uL (ref 0.0–0.5)
Eosinophils Relative: 2 %
HCT: 37.1 % (ref 36.0–46.0)
Hemoglobin: 11.6 g/dL — ABNORMAL LOW (ref 12.0–15.0)
Immature Granulocytes: 0 %
Lymphocytes Relative: 20 %
Lymphs Abs: 1.2 10*3/uL (ref 0.7–4.0)
MCH: 27.5 pg (ref 26.0–34.0)
MCHC: 31.3 g/dL (ref 30.0–36.0)
MCV: 87.9 fL (ref 80.0–100.0)
Monocytes Absolute: 0.6 10*3/uL (ref 0.1–1.0)
Monocytes Relative: 10 %
Neutro Abs: 3.9 10*3/uL (ref 1.7–7.7)
Neutrophils Relative %: 67 %
Platelets: 162 10*3/uL (ref 150–400)
RBC: 4.22 MIL/uL (ref 3.87–5.11)
RDW: 15.6 % — ABNORMAL HIGH (ref 11.5–15.5)
WBC: 5.8 10*3/uL (ref 4.0–10.5)
nRBC: 0 % (ref 0.0–0.2)

## 2019-10-25 LAB — VANCOMYCIN, TROUGH: Vancomycin Tr: 18 ug/mL (ref 15–20)

## 2019-10-27 ENCOUNTER — Telehealth: Payer: Self-pay

## 2019-10-27 NOTE — Telephone Encounter (Signed)
Patient will be advised to go to ER for evaluation.  She says she live closest to [facility] so she will go there.  Patient says the home health nurse was not able to draw her blood in available arm with multiple attempts.    Laverle Patter, RN

## 2019-10-27 NOTE — Telephone Encounter (Signed)
Received call from [facility]) stating patient is having issues with her picc line not flushing and patient declined peripheral blood draw. Patient's insurance does not cover cath flow and out of pocket would be $395.50. LPN attempted to call patient and left voicemail to call [facility] back to possibly have picc line evaluated at ED. Will attempt to call patient again. Eugenia Mcalpine

## 2019-10-28 ENCOUNTER — Encounter (HOSPITAL_BASED_OUTPATIENT_CLINIC_OR_DEPARTMENT_OTHER): Payer: Self-pay | Admitting: *Deleted

## 2019-10-28 ENCOUNTER — Other Ambulatory Visit: Payer: Self-pay

## 2019-10-28 ENCOUNTER — Emergency Department (HOSPITAL_BASED_OUTPATIENT_CLINIC_OR_DEPARTMENT_OTHER)
Admission: EM | Admit: 2019-10-28 | Discharge: 2019-10-28 | Disposition: A | Discharge status: Home / Self Care | Payer: PRIVATE HEALTH INSURANCE | Attending: Emergency Medicine | Admitting: Emergency Medicine

## 2019-10-28 DIAGNOSIS — Z79899 Other long term (current) drug therapy: Secondary | ICD-10-CM | POA: Insufficient documentation

## 2019-10-28 DIAGNOSIS — Z7982 Long term (current) use of aspirin: Secondary | ICD-10-CM | POA: Diagnosis not present

## 2019-10-28 DIAGNOSIS — I2510 Atherosclerotic heart disease of native coronary artery without angina pectoris: Secondary | ICD-10-CM | POA: Diagnosis not present

## 2019-10-28 DIAGNOSIS — N183 Chronic kidney disease, stage 3 unspecified: Secondary | ICD-10-CM | POA: Insufficient documentation

## 2019-10-28 DIAGNOSIS — E1122 Type 2 diabetes mellitus with diabetic chronic kidney disease: Secondary | ICD-10-CM | POA: Insufficient documentation

## 2019-10-28 DIAGNOSIS — I129 Hypertensive chronic kidney disease with stage 1 through stage 4 chronic kidney disease, or unspecified chronic kidney disease: Secondary | ICD-10-CM | POA: Insufficient documentation

## 2019-10-28 DIAGNOSIS — E1140 Type 2 diabetes mellitus with diabetic neuropathy, unspecified: Secondary | ICD-10-CM | POA: Insufficient documentation

## 2019-10-28 DIAGNOSIS — I252 Old myocardial infarction: Secondary | ICD-10-CM | POA: Insufficient documentation

## 2019-10-28 DIAGNOSIS — Z452 Encounter for adjustment and management of vascular access device: Secondary | ICD-10-CM | POA: Insufficient documentation

## 2019-10-28 DIAGNOSIS — Z794 Long term (current) use of insulin: Secondary | ICD-10-CM | POA: Insufficient documentation

## 2019-10-28 MED ORDER — HEPARIN SOD (PORK) LOCK FLUSH 100 UNIT/ML IV SOLN
500.0000 [IU] | Freq: Once | INTRAVENOUS | Status: AC
  Administered 2019-10-28: 500 [IU]
  Filled 2019-10-28: qty 5

## 2019-10-28 NOTE — ED Notes (Signed)
Site is WNL, antimicrobal disc in place, transparent dsg secured and in place. Denies any pain, soreness, aching etc

## 2019-10-28 NOTE — ED Notes (Signed)
Has Portacath in for abx administration. Pt states she had her last infusion this past Monday, states also that medicine was rec, but RN was unable to draw back blood from site, pt states she was told to come to the ED to "unclog port"
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
## 2019-10-28 NOTE — ED Notes (Signed)
Spoke with EDP, in to assess port via flushing with NS, able to flush very easily, but unable to withdraw any blood back into syringe.

## 2019-10-28 NOTE — ED Triage Notes (Signed)
Porta cath right chest. States she was told to come here and we could unclog her porta cath.

## 2019-10-31 ENCOUNTER — Encounter: Admit: 2019-10-31 | Discharge: 2019-10-31 | Payer: MEDICARE

## 2019-11-03 ENCOUNTER — Telehealth: Payer: Self-pay

## 2019-11-03 ENCOUNTER — Telehealth (HOSPITAL_COMMUNITY): Payer: Self-pay

## 2019-11-03 ENCOUNTER — Other Ambulatory Visit: Payer: Self-pay | Admitting: *Deleted

## 2019-11-03 DIAGNOSIS — M00021 Staphylococcal arthritis, right elbow: Secondary | ICD-10-CM

## 2019-11-03 MED ORDER — PANTOPRAZOLE 40 MG PO TBEC
ORAL_TABLET | Freq: Every day | ORAL | 1 refills | 90.00000 days | Status: DC
Start: 2019-11-03 — End: 2020-01-21

## 2019-11-03 NOTE — Telephone Encounter (Signed)
I agree with that plan.

## 2019-11-03 NOTE — Telephone Encounter (Signed)
Relayed orders to have labs done on 1/6 after picc placement to [facility], [place], [place]

## 2019-11-03 NOTE — Telephone Encounter (Signed)
Jeani Hawking called to advise that the patient did go to the ED to have PICC flushed and they were unable to get the PICC working and she has not had labs because she refuses to be stuck veinously. He is concerned because she has had high creat levels in the past. Her last day for IV meds is 11-17-19 and her infusions are running sluggish at this point as well. He wants to know what we would like for the patient to do at this point. Advised him will let the provider know what is going on and someone will give him a call back.  LYNN [phone]

## 2019-11-03 NOTE — Telephone Encounter (Signed)
Received call today from [facility], Pharmacist stating patient has not had labs done in 1 1/2 weeks. [facility] would like to know if office will be doing labs before next refill on antibiotics.  Patient is having picc replaced on 1/5. [name] [name] home health to draw labs on 1/6. Home health will need MD to confirm this is okay. [place]

## 2019-11-03 NOTE — Telephone Encounter (Signed)
Per response and verbal confirmation from Dr Megan Salon called the patient to give her an appointment to have her PICC replaced 11-04-19 at 12 noon. Patient is aware of the appt and [place] is also aware of the PICC change.

## 2019-11-03 NOTE — Telephone Encounter (Signed)
Please see if we can arrange outpatient hip replacement.

## 2019-11-03 NOTE — Telephone Encounter (Signed)
Called to schedule picc replacement, no answer, left vm. AW

## 2019-11-04 ENCOUNTER — Other Ambulatory Visit: Payer: Self-pay | Admitting: Internal Medicine

## 2019-11-04 ENCOUNTER — Ambulatory Visit (HOSPITAL_COMMUNITY)
Admission: RE | Admit: 2019-11-04 | Discharge: 2019-11-04 | Disposition: A | Discharge status: Home / Self Care | Payer: PRIVATE HEALTH INSURANCE | Source: Ambulatory Visit | Attending: Internal Medicine | Admitting: Internal Medicine

## 2019-11-04 ENCOUNTER — Other Ambulatory Visit: Payer: Self-pay

## 2019-11-04 DIAGNOSIS — M00021 Staphylococcal arthritis, right elbow: Secondary | ICD-10-CM | POA: Diagnosis not present

## 2019-11-04 DIAGNOSIS — Z452 Encounter for adjustment and management of vascular access device: Secondary | ICD-10-CM | POA: Insufficient documentation

## 2019-11-04 HISTORY — PX: IR FLUORO GUIDE CV LINE RIGHT: IMG2283

## 2019-11-04 MED ORDER — HEPARIN SOD (PORK) LOCK FLUSH 100 UNIT/ML IV SOLN
INTRAVENOUS | Status: AC
  Filled 2019-11-04: qty 5

## 2019-11-04 MED ORDER — CHLORHEXIDINE GLUCONATE 4 % EX LIQD
CUTANEOUS | Status: AC
  Filled 2019-11-04: qty 15

## 2019-11-04 MED ORDER — LIDOCAINE HCL (PF) 1 % IJ SOLN
INTRAMUSCULAR | Status: AC | PRN
  Administered 2019-11-04: 10 mL

## 2019-11-04 MED ORDER — HEPARIN SOD (PORK) LOCK FLUSH 100 UNIT/ML IV SOLN
INTRAVENOUS | Status: AC | PRN
  Administered 2019-11-04: 500 [IU] via INTRAVENOUS

## 2019-11-04 MED ORDER — LIDOCAINE HCL 1 % IJ SOLN
INTRAMUSCULAR | Status: AC
  Filled 2019-11-04: qty 20

## 2019-11-04 NOTE — Procedures (Signed)
Interventional Radiology Procedure Note  Procedure: Replacement of a right IJ approach single lumen cuffed central venous catheter. Previous 20cm, new catheter is 17cm length.  Tip is positioned at the superior cavoatrial junction and catheter is ready for immediate use.   Complications: None Recommendations:  - Ok to use - Do not submerge - Routine line care   Signed,  Dulcy Fanny. Earleen Newport, DO

## 2019-11-06 ENCOUNTER — Other Ambulatory Visit (HOSPITAL_COMMUNITY)
Admit: 2019-11-06 | Discharge: 2019-11-06 | Disposition: A | Discharge status: Home / Self Care | Payer: PRIVATE HEALTH INSURANCE | Source: Ambulatory Visit | Attending: Internal Medicine | Admitting: Internal Medicine

## 2019-11-06 DIAGNOSIS — M0009 Staphylococcal polyarthritis: Secondary | ICD-10-CM | POA: Insufficient documentation

## 2019-11-06 DIAGNOSIS — N390 Urinary tract infection, site not specified: Secondary | ICD-10-CM | POA: Diagnosis not present

## 2019-11-06 LAB — BASIC METABOLIC PANEL
Anion gap: 9 (ref 5–15)
BUN: 20 mg/dL (ref 8–23)
CO2: 27 mmol/L (ref 22–32)
Calcium: 9.1 mg/dL (ref 8.9–10.3)
Chloride: 100 mmol/L (ref 98–111)
Creatinine, Ser: 1.47 mg/dL — ABNORMAL HIGH (ref 0.44–1.00)
GFR calc Af Amer: 44 mL/min — ABNORMAL LOW (ref >60)
GFR calc non Af Amer: 38 mL/min — ABNORMAL LOW (ref >60)
Glucose, Bld: 322 mg/dL — ABNORMAL HIGH (ref 70–99)
Potassium: 4.6 mmol/L (ref 3.5–5.1)
Sodium: 136 mmol/L (ref 135–145)

## 2019-11-06 LAB — CBC
HCT: 36.5 % (ref 36.0–46.0)
Hemoglobin: 11.8 g/dL — ABNORMAL LOW (ref 12.0–15.0)
MCH: 27.8 pg (ref 26.0–34.0)
MCHC: 32.3 g/dL (ref 30.0–36.0)
MCV: 85.9 fL (ref 80.0–100.0)
Platelets: 163 10*3/uL (ref 150–400)
RBC: 4.25 MIL/uL (ref 3.87–5.11)
RDW: 14.9 % (ref 11.5–15.5)
WBC: 6.8 10*3/uL (ref 4.0–10.5)
nRBC: 0 % (ref 0.0–0.2)

## 2019-11-06 LAB — SEDIMENTATION RATE: Sed Rate: 63 mm/hr — ABNORMAL HIGH (ref 0–22)

## 2019-11-06 LAB — C-REACTIVE PROTEIN: CRP: 0.8 mg/dL (ref <1.0)

## 2019-11-07 ENCOUNTER — Encounter (HOSPITAL_BASED_OUTPATIENT_CLINIC_OR_DEPARTMENT_OTHER): Payer: PRIVATE HEALTH INSURANCE | Admitting: Internal Medicine

## 2019-11-10 ENCOUNTER — Other Ambulatory Visit (HOSPITAL_COMMUNITY)
Admission: RE | Admit: 2019-11-10 | Discharge: 2019-11-10 | Disposition: A | Discharge status: Home / Self Care | Payer: PRIVATE HEALTH INSURANCE | Source: Other Acute Inpatient Hospital | Attending: Internal Medicine | Admitting: Internal Medicine

## 2019-11-10 ENCOUNTER — Other Ambulatory Visit: Payer: Self-pay

## 2019-11-10 DIAGNOSIS — M0009 Staphylococcal polyarthritis: Secondary | ICD-10-CM | POA: Insufficient documentation

## 2019-11-10 DIAGNOSIS — N390 Urinary tract infection, site not specified: Secondary | ICD-10-CM | POA: Insufficient documentation

## 2019-11-10 LAB — BASIC METABOLIC PANEL
Anion gap: 8 (ref 5–15)
BUN: 21 mg/dL (ref 8–23)
CO2: 28 mmol/L (ref 22–32)
Calcium: 8.7 mg/dL — ABNORMAL LOW (ref 8.9–10.3)
Chloride: 97 mmol/L — ABNORMAL LOW (ref 98–111)
Creatinine, Ser: 1.62 mg/dL — ABNORMAL HIGH (ref 0.44–1.00)
GFR calc Af Amer: 39 mL/min — ABNORMAL LOW (ref >60)
GFR calc non Af Amer: 33 mL/min — ABNORMAL LOW (ref >60)
Glucose, Bld: 436 mg/dL — ABNORMAL HIGH (ref 70–99)
Potassium: 4.4 mmol/L (ref 3.5–5.1)
Sodium: 133 mmol/L — ABNORMAL LOW (ref 135–145)

## 2019-11-10 LAB — CBC
HCT: 35.6 % — ABNORMAL LOW (ref 36.0–46.0)
Hemoglobin: 11.5 g/dL — ABNORMAL LOW (ref 12.0–15.0)
MCH: 27.8 pg (ref 26.0–34.0)
MCHC: 32.3 g/dL (ref 30.0–36.0)
MCV: 86 fL (ref 80.0–100.0)
Platelets: 123 10*3/uL — ABNORMAL LOW (ref 150–400)
RBC: 4.14 MIL/uL (ref 3.87–5.11)
RDW: 14.6 % (ref 11.5–15.5)
WBC: 5.6 10*3/uL (ref 4.0–10.5)
nRBC: 0 % (ref 0.0–0.2)

## 2019-11-10 LAB — VANCOMYCIN, TROUGH: Vancomycin Tr: 8 ug/mL — ABNORMAL LOW (ref 15–20)

## 2019-11-10 LAB — CK: Total CK: 109 U/L (ref 38–234)

## 2019-11-11 ENCOUNTER — Other Ambulatory Visit: Payer: Self-pay

## 2019-11-11 ENCOUNTER — Telehealth: Payer: Self-pay | Admitting: Family

## 2019-11-11 ENCOUNTER — Encounter: Payer: Self-pay | Admitting: Family

## 2019-11-11 ENCOUNTER — Ambulatory Visit (HOSPITAL_BASED_OUTPATIENT_CLINIC_OR_DEPARTMENT_OTHER)
Admission: RE | Admit: 2019-11-11 | Discharge: 2019-11-11 | Disposition: A | Discharge status: Home / Self Care | Payer: PRIVATE HEALTH INSURANCE | Source: Ambulatory Visit | Attending: Family | Admitting: Family

## 2019-11-11 ENCOUNTER — Emergency Department (HOSPITAL_BASED_OUTPATIENT_CLINIC_OR_DEPARTMENT_OTHER): Payer: PRIVATE HEALTH INSURANCE

## 2019-11-11 ENCOUNTER — Ambulatory Visit (INDEPENDENT_AMBULATORY_CARE_PROVIDER_SITE_OTHER): Payer: PRIVATE HEALTH INSURANCE | Admitting: Family

## 2019-11-11 ENCOUNTER — Emergency Department (HOSPITAL_BASED_OUTPATIENT_CLINIC_OR_DEPARTMENT_OTHER)
Admission: EM | Admit: 2019-11-11 | Discharge: 2019-11-11 | Disposition: A | Discharge status: Home / Self Care | Payer: PRIVATE HEALTH INSURANCE | Attending: Emergency Medicine | Admitting: Emergency Medicine

## 2019-11-11 ENCOUNTER — Encounter (HOSPITAL_BASED_OUTPATIENT_CLINIC_OR_DEPARTMENT_OTHER): Payer: Self-pay | Admitting: *Deleted

## 2019-11-11 VITALS — BP 182/81 | HR 94 | Temp 97.4°F | Resp 18

## 2019-11-11 DIAGNOSIS — E1122 Type 2 diabetes mellitus with diabetic chronic kidney disease: Secondary | ICD-10-CM | POA: Diagnosis not present

## 2019-11-11 DIAGNOSIS — R110 Nausea: Secondary | ICD-10-CM

## 2019-11-11 DIAGNOSIS — Z794 Long term (current) use of insulin: Secondary | ICD-10-CM

## 2019-11-11 DIAGNOSIS — N183 Chronic kidney disease, stage 3 unspecified: Secondary | ICD-10-CM | POA: Diagnosis not present

## 2019-11-11 DIAGNOSIS — R112 Nausea with vomiting, unspecified: Secondary | ICD-10-CM

## 2019-11-11 DIAGNOSIS — Z940 Kidney transplant status: Secondary | ICD-10-CM | POA: Diagnosis not present

## 2019-11-11 DIAGNOSIS — Y92008 Other place in unspecified non-institutional (private) residence as the place of occurrence of the external cause: Secondary | ICD-10-CM | POA: Diagnosis not present

## 2019-11-11 DIAGNOSIS — S32010A Wedge compression fracture of first lumbar vertebra, initial encounter for closed fracture: Secondary | ICD-10-CM | POA: Diagnosis not present

## 2019-11-11 DIAGNOSIS — E1165 Type 2 diabetes mellitus with hyperglycemia: Secondary | ICD-10-CM

## 2019-11-11 DIAGNOSIS — M545 Low back pain: Secondary | ICD-10-CM | POA: Diagnosis not present

## 2019-11-11 DIAGNOSIS — Y999 Unspecified external cause status: Secondary | ICD-10-CM | POA: Diagnosis not present

## 2019-11-11 DIAGNOSIS — I129 Hypertensive chronic kidney disease with stage 1 through stage 4 chronic kidney disease, or unspecified chronic kidney disease: Secondary | ICD-10-CM | POA: Diagnosis not present

## 2019-11-11 DIAGNOSIS — Z79899 Other long term (current) drug therapy: Secondary | ICD-10-CM | POA: Insufficient documentation

## 2019-11-11 DIAGNOSIS — W01198A Fall on same level from slipping, tripping and stumbling with subsequent striking against other object, initial encounter: Secondary | ICD-10-CM | POA: Diagnosis not present

## 2019-11-11 DIAGNOSIS — R1031 Right lower quadrant pain: Secondary | ICD-10-CM | POA: Insufficient documentation

## 2019-11-11 DIAGNOSIS — Y9301 Activity, walking, marching and hiking: Secondary | ICD-10-CM | POA: Diagnosis not present

## 2019-11-11 DIAGNOSIS — R109 Unspecified abdominal pain: Secondary | ICD-10-CM | POA: Insufficient documentation

## 2019-11-11 DIAGNOSIS — S299XXA Unspecified injury of thorax, initial encounter: Secondary | ICD-10-CM | POA: Diagnosis present

## 2019-11-11 LAB — CBC WITH DIFFERENTIAL/PLATELET
Abs Immature Granulocytes: 0.05 10*3/uL (ref 0.00–0.07)
Basophils Absolute: 0.1 K/uL (ref 0.0–0.1)
Basophils Absolute: 0 10*3/uL (ref 0.0–0.1)
Basophils Relative: 0.7 % (ref 0.0–3.0)
Basophils Relative: 1 %
Eosinophils Absolute: 0.1 K/uL (ref 0.0–0.7)
Eosinophils Absolute: 0.1 10*3/uL (ref 0.0–0.5)
Eosinophils Relative: 1.2 % (ref 0.0–5.0)
Eosinophils Relative: 1 %
HCT: 38.3 % (ref 36.0–46.0)
HCT: 38.8 % (ref 36.0–46.0)
Hemoglobin: 12.6 g/dL (ref 12.0–15.0)
Hemoglobin: 12.6 g/dL (ref 12.0–15.0)
Immature Granulocytes: 1 %
Lymphocytes Relative: 13.4 % (ref 12.0–46.0)
Lymphocytes Relative: 12 %
Lymphs Abs: 1 K/uL (ref 0.7–4.0)
Lymphs Abs: 0.8 10*3/uL (ref 0.7–4.0)
MCH: 27.2 pg (ref 26.0–34.0)
MCHC: 32.9 g/dL (ref 30.0–36.0)
MCHC: 32.5 g/dL (ref 30.0–36.0)
MCV: 84 fl (ref 78.0–100.0)
MCV: 83.8 fL (ref 80.0–100.0)
Monocytes Absolute: 0.5 K/uL (ref 0.1–1.0)
Monocytes Absolute: 0.5 10*3/uL (ref 0.1–1.0)
Monocytes Relative: 7.3 % (ref 3.0–12.0)
Monocytes Relative: 7 %
Neutro Abs: 5.5 K/uL (ref 1.4–7.7)
Neutro Abs: 5.3 10*3/uL (ref 1.7–7.7)
Neutrophils Relative %: 77.4 % — ABNORMAL HIGH (ref 43.0–77.0)
Neutrophils Relative %: 78 %
Platelets: 135 K/uL — ABNORMAL LOW (ref 150.0–400.0)
Platelets: 124 10*3/uL — ABNORMAL LOW (ref 150–400)
RBC: 4.56 Mil/uL (ref 3.87–5.11)
RBC: 4.63 MIL/uL (ref 3.87–5.11)
RDW: 15.7 % — ABNORMAL HIGH (ref 11.5–15.5)
RDW: 14.5 % (ref 11.5–15.5)
WBC: 7.1 K/uL (ref 4.0–10.5)
WBC: 6.7 10*3/uL (ref 4.0–10.5)
nRBC: 0 % (ref 0.0–0.2)

## 2019-11-11 LAB — COMPREHENSIVE METABOLIC PANEL
ALT: 16 U/L (ref 0–35)
ALT: 20 U/L (ref 0–44)
AST: 19 U/L (ref 0–37)
AST: 22 U/L (ref 15–41)
Albumin: 3.7 g/dL (ref 3.5–5.2)
Albumin: 3.5 g/dL (ref 3.5–5.0)
Alkaline Phosphatase: 125 U/L — ABNORMAL HIGH (ref 39–117)
Alkaline Phosphatase: 118 U/L (ref 38–126)
Anion gap: 13 (ref 5–15)
BUN: 20 mg/dL (ref 6–23)
BUN: 22 mg/dL (ref 8–23)
CO2: 27 mEq/L (ref 19–32)
CO2: 22 mmol/L (ref 22–32)
Calcium: 9.5 mg/dL (ref 8.4–10.5)
Calcium: 8.9 mg/dL (ref 8.9–10.3)
Chloride: 97 mEq/L (ref 96–112)
Chloride: 98 mmol/L (ref 98–111)
Creatinine, Ser: 1.59 mg/dL — ABNORMAL HIGH (ref 0.40–1.20)
Creatinine, Ser: 1.68 mg/dL — ABNORMAL HIGH (ref 0.44–1.00)
GFR calc Af Amer: 37 mL/min — ABNORMAL LOW (ref >60)
GFR calc non Af Amer: 32 mL/min — ABNORMAL LOW (ref >60)
GFR: 32.71 mL/min — ABNORMAL LOW (ref >60.00)
Glucose, Bld: 409 mg/dL — ABNORMAL HIGH (ref 70–99)
Glucose, Bld: 423 mg/dL — ABNORMAL HIGH (ref 70–99)
Potassium: 4 mEq/L (ref 3.5–5.1)
Potassium: 3.6 mmol/L (ref 3.5–5.1)
Sodium: 133 mEq/L — ABNORMAL LOW (ref 135–145)
Sodium: 133 mmol/L — ABNORMAL LOW (ref 135–145)
Total Bilirubin: 0.8 mg/dL (ref 0.2–1.2)
Total Bilirubin: 1.2 mg/dL (ref 0.3–1.2)
Total Protein: 8.2 g/dL (ref 6.0–8.3)
Total Protein: 8.2 g/dL — ABNORMAL HIGH (ref 6.5–8.1)

## 2019-11-11 LAB — URINALYSIS, MICROSCOPIC (REFLEX)

## 2019-11-11 LAB — URINALYSIS, ROUTINE W REFLEX MICROSCOPIC
Bilirubin Urine: NEGATIVE
Glucose, UA: >=500 mg/dL — AB
Ketones, ur: 15 mg/dL — AB
Leukocytes,Ua: NEGATIVE
Nitrite: NEGATIVE
Protein, ur: 100 mg/dL — AB
Specific Gravity, Urine: 1.01 (ref 1.005–1.030)
pH: 6 (ref 5.0–8.0)

## 2019-11-11 LAB — TROPONIN I (HIGH SENSITIVITY)
Troponin I (High Sensitivity): 4 ng/L (ref <18)
Troponin I (High Sensitivity): 6 ng/L (ref <18)

## 2019-11-11 LAB — CBG MONITORING, ED: Glucose-Capillary: 404 mg/dL — ABNORMAL HIGH (ref 70–99)

## 2019-11-11 MED ORDER — HYDROCODONE-ACETAMINOPHEN 5-325 MG PO TABS: 1.0000 | ORAL_TABLET | Freq: Four times a day (QID) | ORAL | 0 refills | Status: DC | PRN

## 2019-11-11 MED ORDER — HYDROCODONE-ACETAMINOPHEN 5-325 MG PO TABS
2.0000 | ORAL_TABLET | Freq: Once | ORAL | Status: AC
  Administered 2019-11-11: 2 via ORAL
  Filled 2019-11-11: qty 2

## 2019-11-11 MED ORDER — ONDANSETRON HCL 4 MG/2ML IJ SOLN
INTRAMUSCULAR | Status: AC
  Filled 2019-11-11: qty 2

## 2019-11-11 MED ORDER — ONDANSETRON 4 MG PO TBDP
4.0000 mg | ORAL_TABLET | Freq: Once | ORAL | Status: AC
  Administered 2019-11-11: 4 mg via ORAL
  Filled 2019-11-11: qty 1

## 2019-11-11 MED ORDER — ONDANSETRON HCL 4 MG/2ML IJ SOLN
4.0000 mg | Freq: Once | INTRAMUSCULAR | Status: AC
  Administered 2019-11-11: 4 mg via INTRAVENOUS

## 2019-11-11 MED ORDER — ONDANSETRON 4 MG PO TBDP: 4.0000 mg | ORAL_TABLET | Freq: Three times a day (TID) | ORAL | 0 refills | Status: DC | PRN

## 2019-11-11 MED ORDER — ONDANSETRON 4 MG PO TBDP: 4.0000 mg | ORAL_TABLET | ORAL | 0 refills | Status: DC | PRN

## 2019-11-11 MED ORDER — SODIUM CHLORIDE 0.9 % IV BOLUS
1000.0000 mL | Freq: Once | INTRAVENOUS | Status: AC
  Administered 2019-11-11: 1000 mL via INTRAVENOUS

## 2019-11-11 MED ORDER — MORPHINE SULFATE (PF) 4 MG/ML IV SOLN
4.0000 mg | Freq: Once | INTRAVENOUS | Status: AC
  Administered 2019-11-11: 4 mg via INTRAVENOUS
  Filled 2019-11-11: qty 1

## 2019-11-11 MED ORDER — INSULIN REGULAR HUMAN 100 UNIT/ML IJ SOLN
10.0000 [IU] | Freq: Once | INTRAMUSCULAR | Status: AC
  Administered 2019-11-11: 10 [IU] via SUBCUTANEOUS
  Filled 2019-11-11: qty 1

## 2019-11-11 MED ORDER — ONDANSETRON HCL 4 MG/2ML IJ SOLN
4.0000 mg | Freq: Once | INTRAMUSCULAR | Status: AC
  Administered 2019-11-11: 12:00:00 4 mg via INTRAMUSCULAR

## 2019-11-11 NOTE — Telephone Encounter (Signed)
I see that she went to the ED after her visit. Sugar is very uncontrolled. I have placed a referral to endocrinology.

## 2019-11-11 NOTE — ED Provider Notes (Signed)
UA pending, oral challenge and ambulate. Needs prescription for antiemetic. Physical Exam  BP (!) 158/79 (BP Location: Right Arm)   Pulse 96   Temp 98.3 F (36.8 C) (Oral)   Resp 20   Ht 5' 1"  (1.549 m)   Wt 81.6 kg   SpO2 93%   BMI 34.01 kg/m   Physical Exam  ED Course/Procedures     Procedures  MDM  Patient rechecked.  She is tolerating fluids.  She is alert and appropriate.  CT is identified lumbar compression fracture.  No other acute findings.  Urinalysis without signs of infection.  Will plan to trial Vicodin and Zofran for symptoms control.  Patient is counseled on close follow-up and return precautions.       Charlesetta Shanks, MD 11/11/19 [phone]

## 2019-11-11 NOTE — Telephone Encounter (Signed)
Reviewed x-ray shows non-displaced rib fractures, attempted to reach pt re: her results. Left message requesting that she return our call.  Rod Holler when patient calls tomorrow please let her know that there are 2 ribs that are fractured.  Her lungs look good.  There is nothing special to do for these types of rib fractures.  They should heal on their own.  In the meantime she should continue her regular pain medication and can apply topical lidocaine patches such as over-the-counter Salonpas patches to the area for additional pain relief.

## 2019-11-11 NOTE — ED Triage Notes (Signed)
She tripped and fell 3 days ago. Injury to her right ribs. She is vomiting due to pain. She had a CXR before coming to the ER.
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
## 2019-11-11 NOTE — ED Notes (Signed)
Ambulated in room.  Patient is unsteady, c/o pain to right lower back voiced.  Pain med has been given.

## 2019-11-11 NOTE — ED Notes (Signed)
Tolerated PO intake well.  Patient still c/o being nauseous.

## 2019-11-11 NOTE — Patient Instructions (Signed)
Please complete lab work prior to leaving. You may use zofran as needed for nausea. Go to the ER if you are unable to keep down food/liquid or if you develop worsening pain.

## 2019-11-11 NOTE — ED Notes (Signed)
Pt is calling her husband from her cell phone and giving him an update.

## 2019-11-12 ENCOUNTER — Emergency Department (HOSPITAL_BASED_OUTPATIENT_CLINIC_OR_DEPARTMENT_OTHER): Payer: PRIVATE HEALTH INSURANCE

## 2019-11-12 ENCOUNTER — Other Ambulatory Visit: Payer: Self-pay | Admitting: Pharmacist

## 2019-11-12 ENCOUNTER — Encounter (HOSPITAL_BASED_OUTPATIENT_CLINIC_OR_DEPARTMENT_OTHER): Payer: Self-pay

## 2019-11-12 ENCOUNTER — Other Ambulatory Visit: Payer: Self-pay

## 2019-11-12 ENCOUNTER — Emergency Department (HOSPITAL_BASED_OUTPATIENT_CLINIC_OR_DEPARTMENT_OTHER)
Admission: EM | Admit: 2019-11-12 | Discharge: 2019-11-13 | Disposition: A | Discharge status: Home / Self Care | Payer: PRIVATE HEALTH INSURANCE | Source: Home / Self Care | Attending: Emergency Medicine | Admitting: Emergency Medicine

## 2019-11-12 ENCOUNTER — Encounter: Admit: 2019-11-12 | Discharge: 2019-11-12 | Payer: MEDICARE

## 2019-11-12 DIAGNOSIS — R079 Chest pain, unspecified: Secondary | ICD-10-CM

## 2019-11-12 DIAGNOSIS — R112 Nausea with vomiting, unspecified: Secondary | ICD-10-CM

## 2019-11-12 DIAGNOSIS — E1122 Type 2 diabetes mellitus with diabetic chronic kidney disease: Secondary | ICD-10-CM | POA: Insufficient documentation

## 2019-11-12 DIAGNOSIS — R1084 Generalized abdominal pain: Secondary | ICD-10-CM | POA: Insufficient documentation

## 2019-11-12 DIAGNOSIS — Z794 Long term (current) use of insulin: Secondary | ICD-10-CM | POA: Insufficient documentation

## 2019-11-12 DIAGNOSIS — Z79899 Other long term (current) drug therapy: Secondary | ICD-10-CM | POA: Insufficient documentation

## 2019-11-12 DIAGNOSIS — R0789 Other chest pain: Secondary | ICD-10-CM | POA: Insufficient documentation

## 2019-11-12 DIAGNOSIS — Z7982 Long term (current) use of aspirin: Secondary | ICD-10-CM | POA: Insufficient documentation

## 2019-11-12 DIAGNOSIS — I2510 Atherosclerotic heart disease of native coronary artery without angina pectoris: Secondary | ICD-10-CM | POA: Insufficient documentation

## 2019-11-12 DIAGNOSIS — I129 Hypertensive chronic kidney disease with stage 1 through stage 4 chronic kidney disease, or unspecified chronic kidney disease: Secondary | ICD-10-CM | POA: Insufficient documentation

## 2019-11-12 DIAGNOSIS — E1143 Type 2 diabetes mellitus with diabetic autonomic (poly)neuropathy: Secondary | ICD-10-CM | POA: Diagnosis not present

## 2019-11-12 DIAGNOSIS — N183 Chronic kidney disease, stage 3 unspecified: Secondary | ICD-10-CM | POA: Insufficient documentation

## 2019-11-12 DIAGNOSIS — J45909 Unspecified asthma, uncomplicated: Secondary | ICD-10-CM | POA: Insufficient documentation

## 2019-11-12 LAB — COMPREHENSIVE METABOLIC PANEL
ALT: 19 U/L (ref 0–44)
AST: 20 U/L (ref 15–41)
Albumin: 3.5 g/dL (ref 3.5–5.0)
Alkaline Phosphatase: 111 U/L (ref 38–126)
Anion gap: 13 (ref 5–15)
BUN: 22 mg/dL (ref 8–23)
CO2: 21 mmol/L — ABNORMAL LOW (ref 22–32)
Calcium: 9.2 mg/dL (ref 8.9–10.3)
Chloride: 100 mmol/L (ref 98–111)
Creatinine, Ser: 1.65 mg/dL — ABNORMAL HIGH (ref 0.44–1.00)
GFR calc Af Amer: 38 mL/min — ABNORMAL LOW (ref >60)
GFR calc non Af Amer: 33 mL/min — ABNORMAL LOW (ref >60)
Glucose, Bld: 462 mg/dL — ABNORMAL HIGH (ref 70–99)
Potassium: 3.7 mmol/L (ref 3.5–5.1)
Sodium: 134 mmol/L — ABNORMAL LOW (ref 135–145)
Total Bilirubin: 1.3 mg/dL — ABNORMAL HIGH (ref 0.3–1.2)
Total Protein: 8.1 g/dL (ref 6.5–8.1)

## 2019-11-12 LAB — CBC WITH DIFFERENTIAL/PLATELET
Abs Immature Granulocytes: 0.02 10*3/uL (ref 0.00–0.07)
Basophils Absolute: 0 10*3/uL (ref 0.0–0.1)
Basophils Relative: 0 %
Eosinophils Absolute: 0 10*3/uL (ref 0.0–0.5)
Eosinophils Relative: 0 %
HCT: 37.9 % (ref 36.0–46.0)
Hemoglobin: 12.5 g/dL (ref 12.0–15.0)
Immature Granulocytes: 0 %
Lymphocytes Relative: 10 %
Lymphs Abs: 0.7 10*3/uL (ref 0.7–4.0)
MCH: 27.7 pg (ref 26.0–34.0)
MCHC: 33 g/dL (ref 30.0–36.0)
MCV: 84 fL (ref 80.0–100.0)
Monocytes Absolute: 0.4 10*3/uL (ref 0.1–1.0)
Monocytes Relative: 6 %
Neutro Abs: 5.7 10*3/uL (ref 1.7–7.7)
Neutrophils Relative %: 84 %
Platelets: 146 10*3/uL — ABNORMAL LOW (ref 150–400)
RBC: 4.51 MIL/uL (ref 3.87–5.11)
RDW: 14.2 % (ref 11.5–15.5)
WBC: 6.8 10*3/uL (ref 4.0–10.5)
nRBC: 0 % (ref 0.0–0.2)

## 2019-11-12 LAB — TROPONIN I (HIGH SENSITIVITY)
Troponin I (High Sensitivity): 5 ng/L (ref <18)
Troponin I (High Sensitivity): 6 ng/L (ref <18)

## 2019-11-12 LAB — LIPASE, BLOOD: Lipase: 33 U/L (ref 11–51)

## 2019-11-12 LAB — GLUCOSE, POCT (MANUAL RESULT ENTRY): POC Glucose: 272 mg/dl — AB (ref 70–99)

## 2019-11-12 MED ORDER — MORPHINE SULFATE (PF) 4 MG/ML IV SOLN
4.0000 mg | Freq: Once | INTRAVENOUS | Status: AC
  Administered 2019-11-12: 4 mg via INTRAVENOUS
  Filled 2019-11-12: qty 1

## 2019-11-12 MED ORDER — ONDANSETRON HCL 4 MG/2ML IJ SOLN
4.0000 mg | Freq: Once | INTRAMUSCULAR | Status: AC
  Administered 2019-11-12: 4 mg via INTRAVENOUS
  Filled 2019-11-12: qty 2

## 2019-11-12 MED ORDER — INSULIN ASPART 100 UNIT/ML ~~LOC~~ SOLN
10.0000 [IU] | Freq: Once | SUBCUTANEOUS | Status: AC
  Administered 2019-11-12: 10 [IU] via INTRAVENOUS
  Filled 2019-11-12: qty 1

## 2019-11-12 MED ORDER — SODIUM CHLORIDE 0.9 % IV BOLUS
1000.0000 mL | Freq: Once | INTRAVENOUS | Status: AC
  Administered 2019-11-12: 1000 mL via INTRAVENOUS

## 2019-11-12 MED ORDER — LORAZEPAM 2 MG/ML IJ SOLN
1.0000 mg | Freq: Once | INTRAMUSCULAR | Status: AC
  Administered 2019-11-12: 1 mg via INTRAVENOUS
  Filled 2019-11-12: qty 1

## 2019-11-12 NOTE — Addendum Note (Signed)
Addended by: Jiles Prows on: 11/12/2019 01:24 PM   Modules accepted: Orders
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
## 2019-11-12 NOTE — ED Notes (Signed)
Pt arrives via ems with Portacath accessed right chest. Transparent dressing in place, intact.

## 2019-11-12 NOTE — Discharge Instructions (Addendum)
Make sure you drink plenty of fluids and staying hydrated.  Follow-up with your primary care doctor.  Return the emergency department for any vomiting, abdominal pain, chest pain, or any other worsening or concerning symptoms.

## 2019-11-12 NOTE — Telephone Encounter (Signed)
Lvm for patient to call back

## 2019-11-12 NOTE — Telephone Encounter (Signed)
Patient and husband advised of results and that per Melissa she also has a compression fracture. They understand recommendation and will follow up with [place] next week at scheduled appt.

## 2019-11-12 NOTE — ED Notes (Signed)
Pt found naked attempting to exit room.  Assisted pt back into gown and to restroom.  Pt had urinated all over linen.  Linen changed.  Pt's gown changed.  Warm blankets given and pt resting quietly at this time.

## 2019-11-12 NOTE — ED Notes (Signed)
PT actively Vomiting cannot give urine at this time

## 2019-11-12 NOTE — ED Notes (Signed)
Pt given water, left at bedside, patient asleep

## 2019-11-12 NOTE — ED Notes (Signed)
Pt's O2 sat noted to be low 80's on RA d/t medication. 2L O2 applied via [place] w/ improvement in O2 sat to 94%.

## 2019-11-12 NOTE — ED Triage Notes (Signed)
Pt arrives via ems with c/o n/v, back pain, abdominal pain, tearful on arrival.  Seen yesterday for same, states no improvement in symptoms.

## 2019-11-13 ENCOUNTER — Telehealth: Payer: Self-pay | Admitting: Family Medicine

## 2019-11-13 ENCOUNTER — Other Ambulatory Visit: Payer: Self-pay | Admitting: Family Medicine

## 2019-11-13 ENCOUNTER — Other Ambulatory Visit: Payer: Self-pay

## 2019-11-13 ENCOUNTER — Telehealth: Payer: Self-pay | Admitting: Pharmacist

## 2019-11-13 LAB — URINE CULTURE: Culture: 20000 — AB

## 2019-11-13 MED ORDER — GLIMEPIRIDE 2 MG PO TAB
ORAL_TABLET | Freq: Every day | ORAL | 1 refills | 90.00000 days | Status: AC
Start: 2019-11-13 — End: ?

## 2019-11-13 MED ORDER — ALCOHOL SWABS TP PADM
1 refills | 30.00000 days | Status: AC
Start: 2019-11-13 — End: ?

## 2019-11-13 MED ORDER — PROMETHAZINE HCL 25 MG RE SUPP: 25.0000 mg | Freq: Four times a day (QID) | RECTAL | 0 refills | Status: DC | PRN

## 2019-11-13 NOTE — Telephone Encounter (Signed)
Lets make that decision when she comes into clinic next Monday.

## 2019-11-13 NOTE — Telephone Encounter (Signed)
Per Dr. Carollee Herter patient advised to let medication dissolve in her mouth rather than swallowing and to increase to 2 tabs (8 mg).  Also sent a rx for phenergan suppositories 25 mg patient advised to pick up if not better results with zofran 8 mg.

## 2019-11-13 NOTE — Telephone Encounter (Signed)
Reason for CRM: Pt's husband called to report that patient is still experiencing severe symptoms. Darryl wants call back from Clinic for advice, he says medication that was prescribed in recent visit is not working [phone]

## 2019-11-13 NOTE — Telephone Encounter (Signed)
Good idea! Thank you.

## 2019-11-13 NOTE — Telephone Encounter (Signed)
Spoke with Jeani Hawking, pharmacist at [facility], to discuss patient's OPAT labs and orders.  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 Jeani Hawking has been concerned for low vanc troughs and issues with patient's PICC line over course of therapy.    Patient went to the ED on 12/29 after home health nursing had issues flushing her PICC. They were unable to get the PICC working at the ED so a new PICC was ordered and placed on 1/5. Patient refused to have labs drawn peripherally so no labs were obtained between this time frame. There has also been concerns of adherence to home infusions.  Here are patient's labs since starting vancomycin:   - 11/30 SCr 1.78 - Started vanc on 12/7 at 1 gm IV daily  - 12/10 VT 14, SCr 1.55 - 12/17 VT 12,  SCr 1.65 - 12/22 VT 7, SCr 1.56, CRP 1.4, ESR 48 - 12/26 VT 18, SCr 1.48 - 1/7 VT not done; SCr 1.47, CRP 0.8, ESR 63 - 1/11 VT 8, SCr 1.62  Patient was changed from daily to every other day dosing on 12/11 per Jeani Hawking because of SCr. He started at 1000 mg, then went up to 1250 mg, and now patient is on 1500 mg IV every other day.  Patient is scheduled to stop vanc on Monday 1/18. Jeani Hawking would like to know if Dr. Megan Salon still wants to stop vanc or has another plan since her vanc has not been therapeutic since 12/26.

## 2019-11-13 NOTE — Telephone Encounter (Signed)
Patient was seen by Melissa 2 days ago for broken ribs and compression fracture after falling last [place]. Per husband Zofran not helping with her nausea and vomiting. Not able to keep pain medication down.  Please advise

## 2019-11-14 ENCOUNTER — Encounter (HOSPITAL_BASED_OUTPATIENT_CLINIC_OR_DEPARTMENT_OTHER): Payer: PRIVATE HEALTH INSURANCE | Admitting: Internal Medicine

## 2019-11-14 ENCOUNTER — Telehealth: Payer: Self-pay | Admitting: Emergency Medicine

## 2019-11-14 ENCOUNTER — Encounter: Payer: Self-pay | Admitting: Internal Medicine

## 2019-11-14 NOTE — Telephone Encounter (Signed)
Post ED Visit - Positive Culture Follow-up  Culture report reviewed by antimicrobial stewardship pharmacist: [place] Team []  Elenor Quinones, Pharm.D. []  Heide Guile, Pharm.D., BCPS AQ-ID []  Parks Neptune, Pharm.D., BCPS []  Alycia Rossetti, Pharm.D., BCPS []  [place], Pharm.D., BCPS, AAHIVP []  Legrand Como, Pharm.D., BCPS, AAHIVP []  Salome Arnt, PharmD, BCPS []  Johnnette Gourd, PharmD, BCPS []  Hughes Better, PharmD, BCPS [x]  Richardine Service, PharmD []  Laqueta Linden, PharmD, BCPS []  [name] Parr, PharmD  [place] Team []  Leodis Sias, PharmD []  Lindell Spar, PharmD []  Royetta Asal, PharmD []  Graylin Shiver, Rph []  Rema Fendt) Glennon Mac, PharmD []  Arlyn Dunning, PharmD []  Netta Cedars, PharmD []  [name] Sitter, PharmD []  Leone Haven, PharmD []  Gretta Arab, PharmD []  Theodis Shove, PharmD []  Peggyann Juba, PharmD []  Reuel Boom, PharmD   Positive urine culture no further patient follow-up is required at this time.  Sandi Raveling [name] [name] 11/14/2019, 11:23 AM

## 2019-11-14 NOTE — Progress Notes (Signed)
[name], adult   DOB: May 06, 1956, 64 y.o.   MRN: [id number] [name] called states she broke her back in 3 places and is unable to keep her appointment on 11-17-19 states she is in a lot of pain declined evisit

## 2019-11-15 ENCOUNTER — Inpatient Hospital Stay (HOSPITAL_COMMUNITY)
Admission: EM | Admit: 2019-11-15 | Discharge: 2019-11-18 | DRG: 074 | Disposition: A | Discharge status: Home / Self Care | Payer: PRIVATE HEALTH INSURANCE | Attending: Internal Medicine | Admitting: Internal Medicine

## 2019-11-15 ENCOUNTER — Encounter (HOSPITAL_COMMUNITY): Payer: Self-pay | Admitting: Emergency Medicine

## 2019-11-15 ENCOUNTER — Emergency Department (HOSPITAL_COMMUNITY): Payer: PRIVATE HEALTH INSURANCE

## 2019-11-15 DIAGNOSIS — W1830XA Fall on same level, unspecified, initial encounter: Secondary | ICD-10-CM | POA: Diagnosis present

## 2019-11-15 DIAGNOSIS — M549 Dorsalgia, unspecified: Secondary | ICD-10-CM | POA: Diagnosis present

## 2019-11-15 DIAGNOSIS — E1142 Type 2 diabetes mellitus with diabetic polyneuropathy: Secondary | ICD-10-CM | POA: Diagnosis not present

## 2019-11-15 DIAGNOSIS — I252 Old myocardial infarction: Secondary | ICD-10-CM

## 2019-11-15 DIAGNOSIS — Z818 Family history of other mental and behavioral disorders: Secondary | ICD-10-CM

## 2019-11-15 DIAGNOSIS — K7460 Unspecified cirrhosis of liver: Secondary | ICD-10-CM | POA: Diagnosis present

## 2019-11-15 DIAGNOSIS — Z833 Family history of diabetes mellitus: Secondary | ICD-10-CM

## 2019-11-15 DIAGNOSIS — K219 Gastro-esophageal reflux disease without esophagitis: Secondary | ICD-10-CM | POA: Diagnosis present

## 2019-11-15 DIAGNOSIS — E785 Hyperlipidemia, unspecified: Secondary | ICD-10-CM | POA: Diagnosis present

## 2019-11-15 DIAGNOSIS — R112 Nausea with vomiting, unspecified: Secondary | ICD-10-CM | POA: Diagnosis not present

## 2019-11-15 DIAGNOSIS — Z20822 Contact with and (suspected) exposure to covid-19: Secondary | ICD-10-CM | POA: Diagnosis present

## 2019-11-15 DIAGNOSIS — Z90710 Acquired absence of both cervix and uterus: Secondary | ICD-10-CM

## 2019-11-15 DIAGNOSIS — I2510 Atherosclerotic heart disease of native coronary artery without angina pectoris: Secondary | ICD-10-CM | POA: Diagnosis present

## 2019-11-15 DIAGNOSIS — D509 Iron deficiency anemia, unspecified: Secondary | ICD-10-CM | POA: Diagnosis present

## 2019-11-15 DIAGNOSIS — E7800 Pure hypercholesterolemia, unspecified: Secondary | ICD-10-CM | POA: Diagnosis present

## 2019-11-15 DIAGNOSIS — Z452 Encounter for adjustment and management of vascular access device: Secondary | ICD-10-CM

## 2019-11-15 DIAGNOSIS — S32010A Wedge compression fracture of first lumbar vertebra, initial encounter for closed fracture: Secondary | ICD-10-CM | POA: Diagnosis present

## 2019-11-15 DIAGNOSIS — E1122 Type 2 diabetes mellitus with diabetic chronic kidney disease: Secondary | ICD-10-CM | POA: Diagnosis present

## 2019-11-15 DIAGNOSIS — K7581 Nonalcoholic steatohepatitis (NASH): Secondary | ICD-10-CM | POA: Diagnosis present

## 2019-11-15 DIAGNOSIS — R3129 Other microscopic hematuria: Secondary | ICD-10-CM | POA: Diagnosis present

## 2019-11-15 DIAGNOSIS — T8619 Other complication of kidney transplant: Secondary | ICD-10-CM | POA: Diagnosis present

## 2019-11-15 DIAGNOSIS — M009 Pyogenic arthritis, unspecified: Secondary | ICD-10-CM | POA: Diagnosis present

## 2019-11-15 DIAGNOSIS — Z9049 Acquired absence of other specified parts of digestive tract: Secondary | ICD-10-CM

## 2019-11-15 DIAGNOSIS — E1165 Type 2 diabetes mellitus with hyperglycemia: Secondary | ICD-10-CM | POA: Diagnosis present

## 2019-11-15 DIAGNOSIS — N179 Acute kidney failure, unspecified: Secondary | ICD-10-CM | POA: Diagnosis not present

## 2019-11-15 DIAGNOSIS — L97419 Non-pressure chronic ulcer of right heel and midfoot with unspecified severity: Secondary | ICD-10-CM | POA: Diagnosis present

## 2019-11-15 DIAGNOSIS — S2249XA Multiple fractures of ribs, unspecified side, initial encounter for closed fracture: Secondary | ICD-10-CM | POA: Diagnosis present

## 2019-11-15 DIAGNOSIS — I129 Hypertensive chronic kidney disease with stage 1 through stage 4 chronic kidney disease, or unspecified chronic kidney disease: Secondary | ICD-10-CM | POA: Diagnosis present

## 2019-11-15 DIAGNOSIS — S32019A Unspecified fracture of first lumbar vertebra, initial encounter for closed fracture: Secondary | ICD-10-CM | POA: Diagnosis present

## 2019-11-15 DIAGNOSIS — E1143 Type 2 diabetes mellitus with diabetic autonomic (poly)neuropathy: Principal | ICD-10-CM | POA: Diagnosis present

## 2019-11-15 DIAGNOSIS — Z7982 Long term (current) use of aspirin: Secondary | ICD-10-CM

## 2019-11-15 DIAGNOSIS — K3184 Gastroparesis: Secondary | ICD-10-CM | POA: Diagnosis present

## 2019-11-15 DIAGNOSIS — K760 Fatty (change of) liver, not elsewhere classified: Secondary | ICD-10-CM | POA: Diagnosis present

## 2019-11-15 DIAGNOSIS — G4730 Sleep apnea, unspecified: Secondary | ICD-10-CM | POA: Diagnosis present

## 2019-11-15 DIAGNOSIS — F4310 Post-traumatic stress disorder, unspecified: Secondary | ICD-10-CM | POA: Diagnosis present

## 2019-11-15 DIAGNOSIS — N183 Chronic kidney disease, stage 3 unspecified: Secondary | ICD-10-CM | POA: Diagnosis present

## 2019-11-15 DIAGNOSIS — Y830 Surgical operation with transplant of whole organ as the cause of abnormal reaction of the patient, or of later complication, without mention of misadventure at the time of the procedure: Secondary | ICD-10-CM | POA: Diagnosis present

## 2019-11-15 DIAGNOSIS — M797 Fibromyalgia: Secondary | ICD-10-CM | POA: Diagnosis present

## 2019-11-15 DIAGNOSIS — Z79899 Other long term (current) drug therapy: Secondary | ICD-10-CM

## 2019-11-15 DIAGNOSIS — Z794 Long term (current) use of insulin: Secondary | ICD-10-CM

## 2019-11-15 DIAGNOSIS — E11621 Type 2 diabetes mellitus with foot ulcer: Secondary | ICD-10-CM | POA: Diagnosis present

## 2019-11-15 DIAGNOSIS — I10 Essential (primary) hypertension: Secondary | ICD-10-CM | POA: Diagnosis present

## 2019-11-15 LAB — URINALYSIS, ROUTINE W REFLEX MICROSCOPIC
Bilirubin Urine: NEGATIVE
Glucose, UA: >=500 mg/dL — AB
Ketones, ur: NEGATIVE mg/dL
Leukocytes,Ua: NEGATIVE
Nitrite: NEGATIVE
Protein, ur: 100 mg/dL — AB
Specific Gravity, Urine: 1.016 (ref 1.005–1.030)
pH: 5 (ref 5.0–8.0)

## 2019-11-15 LAB — COMPREHENSIVE METABOLIC PANEL
ALT: 23 U/L (ref 0–44)
AST: 33 U/L (ref 15–41)
Albumin: 2.9 g/dL — ABNORMAL LOW (ref 3.5–5.0)
Alkaline Phosphatase: 120 U/L (ref 38–126)
Anion gap: 10 (ref 5–15)
BUN: 31 mg/dL — ABNORMAL HIGH (ref 8–23)
CO2: 25 mmol/L (ref 22–32)
Calcium: 8.6 mg/dL — ABNORMAL LOW (ref 8.9–10.3)
Chloride: 98 mmol/L (ref 98–111)
Creatinine, Ser: 1.99 mg/dL — ABNORMAL HIGH (ref 0.44–1.00)
GFR calc Af Amer: 30 mL/min — ABNORMAL LOW (ref >60)
GFR calc non Af Amer: 26 mL/min — ABNORMAL LOW (ref >60)
Glucose, Bld: 317 mg/dL — ABNORMAL HIGH (ref 70–99)
Potassium: 3.8 mmol/L (ref 3.5–5.1)
Sodium: 133 mmol/L — ABNORMAL LOW (ref 135–145)
Total Bilirubin: 1.4 mg/dL — ABNORMAL HIGH (ref 0.3–1.2)
Total Protein: 7.3 g/dL (ref 6.5–8.1)

## 2019-11-15 LAB — CBG MONITORING, ED: Glucose-Capillary: 262 mg/dL — ABNORMAL HIGH (ref 70–99)

## 2019-11-15 LAB — CBC
HCT: 41.1 % (ref 36.0–46.0)
Hemoglobin: 13.6 g/dL (ref 12.0–15.0)
MCH: 27.5 pg (ref 26.0–34.0)
MCHC: 33.1 g/dL (ref 30.0–36.0)
MCV: 83 fL (ref 80.0–100.0)
Platelets: 175 10*3/uL (ref 150–400)
RBC: 4.95 MIL/uL (ref 3.87–5.11)
RDW: 14.8 % (ref 11.5–15.5)
WBC: 13.5 10*3/uL — ABNORMAL HIGH (ref 4.0–10.5)
nRBC: 0 % (ref 0.0–0.2)

## 2019-11-15 LAB — LIPASE, BLOOD: Lipase: 26 U/L (ref 11–51)

## 2019-11-15 MED ORDER — MORPHINE SULFATE (PF) 4 MG/ML IV SOLN
4.0000 mg | Freq: Once | INTRAVENOUS | Status: AC
  Administered 2019-11-15: 4 mg via INTRAMUSCULAR
  Filled 2019-11-15: qty 1

## 2019-11-15 MED ORDER — MORPHINE SULFATE (PF) 4 MG/ML IV SOLN
4.0000 mg | Freq: Once | INTRAVENOUS | Status: AC
  Administered 2019-11-15: 4 mg via INTRAVENOUS
  Filled 2019-11-15: qty 1

## 2019-11-15 MED ORDER — SODIUM CHLORIDE 0.9% FLUSH
3.0000 mL | Freq: Once | INTRAVENOUS | Status: AC
  Administered 2019-11-15: 3 mL via INTRAVENOUS

## 2019-11-15 MED ORDER — LORAZEPAM 2 MG/ML IJ SOLN
1.0000 mg | Freq: Once | INTRAMUSCULAR | Status: AC
  Administered 2019-11-15: 1 mg via INTRAVENOUS
  Filled 2019-11-15: qty 1

## 2019-11-15 MED ORDER — SODIUM CHLORIDE 0.9 % IV SOLN: INTRAVENOUS | Status: AC

## 2019-11-15 MED ORDER — INSULIN ASPART 100 UNIT/ML ~~LOC~~ SOLN
0.0000 [IU] | SUBCUTANEOUS | Status: DC
  Administered 2019-11-15: 5 [IU] via SUBCUTANEOUS
  Administered 2019-11-16 (×2): 3 [IU] via SUBCUTANEOUS
  Administered 2019-11-16: 2 [IU] via SUBCUTANEOUS
  Administered 2019-11-16 – 2019-11-17 (×3): 3 [IU] via SUBCUTANEOUS
  Administered 2019-11-17 (×3): 2 [IU] via SUBCUTANEOUS
  Administered 2019-11-17: 5 [IU] via SUBCUTANEOUS
  Administered 2019-11-18: 2 [IU] via SUBCUTANEOUS
  Administered 2019-11-18: 1 [IU] via SUBCUTANEOUS

## 2019-11-15 MED ORDER — LACTATED RINGERS IV BOLUS
2000.0000 mL | Freq: Once | INTRAVENOUS | Status: AC
  Administered 2019-11-15: 2000 mL via INTRAVENOUS

## 2019-11-15 MED ORDER — PROMETHAZINE HCL 25 MG/ML IJ SOLN
6.2500 mg | Freq: Four times a day (QID) | INTRAMUSCULAR | Status: DC | PRN
  Administered 2019-11-15 – 2019-11-16 (×2): 6.25 mg via INTRAVENOUS
  Filled 2019-11-15 (×2): qty 1

## 2019-11-15 MED ORDER — PROCHLORPERAZINE EDISYLATE 10 MG/2ML IJ SOLN
10.0000 mg | Freq: Four times a day (QID) | INTRAMUSCULAR | Status: DC | PRN
  Administered 2019-11-16 (×2): 10 mg via INTRAVENOUS
  Filled 2019-11-15 (×2): qty 2

## 2019-11-15 MED ORDER — SODIUM CHLORIDE 0.9 % IV SOLN
10.0000 mg | INTRAVENOUS | Status: DC
  Administered 2019-11-15 – 2019-11-17 (×3): 10 mg via INTRAVENOUS
  Filled 2019-11-15 (×4): qty 1

## 2019-11-15 MED ORDER — ENOXAPARIN SODIUM 30 MG/0.3ML ~~LOC~~ SOLN
30.0000 mg | SUBCUTANEOUS | Status: DC
  Administered 2019-11-16 – 2019-11-18 (×3): 30 mg via SUBCUTANEOUS
  Filled 2019-11-15 (×3): qty 0.3

## 2019-11-15 MED ORDER — PROMETHAZINE HCL 25 MG/ML IJ SOLN
12.5000 mg | Freq: Once | INTRAMUSCULAR | Status: AC
  Administered 2019-11-15: 12.5 mg via INTRAMUSCULAR
  Filled 2019-11-15: qty 1

## 2019-11-15 MED ORDER — HYDROMORPHONE HCL 1 MG/ML IJ SOLN
0.5000 mg | INTRAMUSCULAR | Status: DC | PRN
  Administered 2019-11-15 – 2019-11-16 (×4): 0.5 mg via INTRAVENOUS
  Filled 2019-11-15 (×4): qty 1

## 2019-11-15 NOTE — ED Notes (Signed)
[name] [name] husband [phone] looking for an update
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
## 2019-11-15 NOTE — ED Triage Notes (Signed)
CBG - 349

## 2019-11-15 NOTE — Progress Notes (Signed)
Orthopedic Tech Progress Note Patient Details:  [name] [name] [birth year] [id number] Called in order to [facility] but I went and applied an LSO brace to patient. Brace is to be worn at all times except when showering. Patient is not alert enough to explain brace Patient ID: [name], adult   DOB: 1956/04/20, 64 y.o.   MRN: [id number]   Janit Pagan 11/15/2019, 9:07 PM

## 2019-11-15 NOTE — ED Triage Notes (Signed)
EMS stated, pt. N/V for 10 days. Takes Vacanmycin for MRSA. Went to [facility]  hospital and given Zofran. Also went 4 days in her rib pain for rib pain form a fall, has a crack rib. Makes her more SOB.
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
## 2019-11-15 NOTE — ED Notes (Signed)
[name] [name] (Husband#[phone]) called for an update.

## 2019-11-16 ENCOUNTER — Other Ambulatory Visit: Payer: Self-pay

## 2019-11-16 DIAGNOSIS — M797 Fibromyalgia: Secondary | ICD-10-CM | POA: Diagnosis present

## 2019-11-16 DIAGNOSIS — K3184 Gastroparesis: Secondary | ICD-10-CM | POA: Diagnosis present

## 2019-11-16 DIAGNOSIS — Z818 Family history of other mental and behavioral disorders: Secondary | ICD-10-CM | POA: Diagnosis not present

## 2019-11-16 DIAGNOSIS — Z794 Long term (current) use of insulin: Secondary | ICD-10-CM | POA: Diagnosis not present

## 2019-11-16 DIAGNOSIS — Z90710 Acquired absence of both cervix and uterus: Secondary | ICD-10-CM | POA: Diagnosis not present

## 2019-11-16 DIAGNOSIS — E785 Hyperlipidemia, unspecified: Secondary | ICD-10-CM | POA: Diagnosis present

## 2019-11-16 DIAGNOSIS — M00021 Staphylococcal arthritis, right elbow: Secondary | ICD-10-CM | POA: Diagnosis not present

## 2019-11-16 DIAGNOSIS — N1832 Chronic kidney disease, stage 3b: Secondary | ICD-10-CM

## 2019-11-16 DIAGNOSIS — R112 Nausea with vomiting, unspecified: Secondary | ICD-10-CM | POA: Diagnosis present

## 2019-11-16 DIAGNOSIS — Y830 Surgical operation with transplant of whole organ as the cause of abnormal reaction of the patient, or of later complication, without mention of misadventure at the time of the procedure: Secondary | ICD-10-CM | POA: Diagnosis present

## 2019-11-16 DIAGNOSIS — F4310 Post-traumatic stress disorder, unspecified: Secondary | ICD-10-CM | POA: Diagnosis present

## 2019-11-16 DIAGNOSIS — M009 Pyogenic arthritis, unspecified: Secondary | ICD-10-CM | POA: Diagnosis present

## 2019-11-16 DIAGNOSIS — Z20822 Contact with and (suspected) exposure to covid-19: Secondary | ICD-10-CM | POA: Diagnosis present

## 2019-11-16 DIAGNOSIS — T8619 Other complication of kidney transplant: Secondary | ICD-10-CM | POA: Diagnosis present

## 2019-11-16 DIAGNOSIS — E1143 Type 2 diabetes mellitus with diabetic autonomic (poly)neuropathy: Secondary | ICD-10-CM | POA: Diagnosis present

## 2019-11-16 DIAGNOSIS — N179 Acute kidney failure, unspecified: Secondary | ICD-10-CM | POA: Diagnosis present

## 2019-11-16 DIAGNOSIS — K7581 Nonalcoholic steatohepatitis (NASH): Secondary | ICD-10-CM | POA: Diagnosis present

## 2019-11-16 DIAGNOSIS — S32019A Unspecified fracture of first lumbar vertebra, initial encounter for closed fracture: Secondary | ICD-10-CM | POA: Diagnosis present

## 2019-11-16 DIAGNOSIS — K7460 Unspecified cirrhosis of liver: Secondary | ICD-10-CM | POA: Diagnosis present

## 2019-11-16 DIAGNOSIS — B9561 Methicillin susceptible Staphylococcus aureus infection as the cause of diseases classified elsewhere: Secondary | ICD-10-CM | POA: Diagnosis not present

## 2019-11-16 DIAGNOSIS — E1122 Type 2 diabetes mellitus with diabetic chronic kidney disease: Secondary | ICD-10-CM | POA: Diagnosis present

## 2019-11-16 DIAGNOSIS — I2510 Atherosclerotic heart disease of native coronary artery without angina pectoris: Secondary | ICD-10-CM | POA: Diagnosis present

## 2019-11-16 DIAGNOSIS — I129 Hypertensive chronic kidney disease with stage 1 through stage 4 chronic kidney disease, or unspecified chronic kidney disease: Secondary | ICD-10-CM | POA: Diagnosis present

## 2019-11-16 DIAGNOSIS — E1142 Type 2 diabetes mellitus with diabetic polyneuropathy: Secondary | ICD-10-CM | POA: Diagnosis present

## 2019-11-16 DIAGNOSIS — N183 Chronic kidney disease, stage 3 unspecified: Secondary | ICD-10-CM | POA: Diagnosis present

## 2019-11-16 DIAGNOSIS — K219 Gastro-esophageal reflux disease without esophagitis: Secondary | ICD-10-CM | POA: Diagnosis present

## 2019-11-16 DIAGNOSIS — K760 Fatty (change of) liver, not elsewhere classified: Secondary | ICD-10-CM | POA: Diagnosis present

## 2019-11-16 DIAGNOSIS — Z833 Family history of diabetes mellitus: Secondary | ICD-10-CM | POA: Diagnosis not present

## 2019-11-16 DIAGNOSIS — W1830XA Fall on same level, unspecified, initial encounter: Secondary | ICD-10-CM | POA: Diagnosis present

## 2019-11-16 DIAGNOSIS — L97419 Non-pressure chronic ulcer of right heel and midfoot with unspecified severity: Secondary | ICD-10-CM | POA: Diagnosis present

## 2019-11-16 LAB — CBC
HCT: 35.6 % — ABNORMAL LOW (ref 36.0–46.0)
Hemoglobin: 11.2 g/dL — ABNORMAL LOW (ref 12.0–15.0)
MCH: 27.3 pg (ref 26.0–34.0)
MCHC: 31.5 g/dL (ref 30.0–36.0)
MCV: 86.6 fL (ref 80.0–100.0)
Platelets: 123 10*3/uL — ABNORMAL LOW (ref 150–400)
RBC: 4.11 MIL/uL (ref 3.87–5.11)
RDW: 14.9 % (ref 11.5–15.5)
WBC: 7.6 10*3/uL (ref 4.0–10.5)
nRBC: 0 % (ref 0.0–0.2)

## 2019-11-16 LAB — COMPREHENSIVE METABOLIC PANEL
ALT: 20 U/L (ref 0–44)
AST: 26 U/L (ref 15–41)
Albumin: 2.3 g/dL — ABNORMAL LOW (ref 3.5–5.0)
Alkaline Phosphatase: 96 U/L (ref 38–126)
Anion gap: 8 (ref 5–15)
BUN: 31 mg/dL — ABNORMAL HIGH (ref 8–23)
CO2: 28 mmol/L (ref 22–32)
Calcium: 8.1 mg/dL — ABNORMAL LOW (ref 8.9–10.3)
Chloride: 101 mmol/L (ref 98–111)
Creatinine, Ser: 1.84 mg/dL — ABNORMAL HIGH (ref 0.44–1.00)
GFR calc Af Amer: 33 mL/min — ABNORMAL LOW (ref >60)
GFR calc non Af Amer: 29 mL/min — ABNORMAL LOW (ref >60)
Glucose, Bld: 226 mg/dL — ABNORMAL HIGH (ref 70–99)
Potassium: 3.5 mmol/L (ref 3.5–5.1)
Sodium: 137 mmol/L (ref 135–145)
Total Bilirubin: 1.2 mg/dL (ref 0.3–1.2)
Total Protein: 6 g/dL — ABNORMAL LOW (ref 6.5–8.1)

## 2019-11-16 LAB — SARS CORONAVIRUS 2 (TAT 6-24 HRS): SARS Coronavirus 2: NEGATIVE

## 2019-11-16 LAB — CBG MONITORING, ED
Glucose-Capillary: 216 mg/dL — ABNORMAL HIGH (ref 70–99)
Glucose-Capillary: 205 mg/dL — ABNORMAL HIGH (ref 70–99)
Glucose-Capillary: 236 mg/dL — ABNORMAL HIGH (ref 70–99)

## 2019-11-16 LAB — GLUCOSE, CAPILLARY
Glucose-Capillary: 202 mg/dL — ABNORMAL HIGH (ref 70–99)
Glucose-Capillary: 199 mg/dL — ABNORMAL HIGH (ref 70–99)

## 2019-11-16 LAB — RAPID URINE DRUG SCREEN, HOSP PERFORMED
Amphetamines: NOT DETECTED
Barbiturates: NOT DETECTED
Benzodiazepines: NOT DETECTED
Cocaine: NOT DETECTED
Opiates: POSITIVE — AB
Tetrahydrocannabinol: NOT DETECTED

## 2019-11-16 LAB — VANCOMYCIN, RANDOM: Vancomycin Rm: 6

## 2019-11-16 MED ORDER — CYCLOBENZAPRINE HCL 10 MG PO TABS
10.0000 mg | ORAL_TABLET | Freq: Three times a day (TID) | ORAL | Status: DC | PRN
  Administered 2019-11-16 – 2019-11-18 (×3): 10 mg via ORAL
  Filled 2019-11-16 (×3): qty 1

## 2019-11-16 MED ORDER — PREGABALIN 75 MG PO CAPS
150.0000 mg | ORAL_CAPSULE | Freq: Two times a day (BID) | ORAL | Status: DC
  Administered 2019-11-16 – 2019-11-18 (×4): 150 mg via ORAL
  Filled 2019-11-16 (×4): qty 2
  Filled 2019-11-16: qty 1

## 2019-11-16 MED ORDER — GUAIFENESIN ER 600 MG PO TB12
600.0000 mg | ORAL_TABLET | Freq: Two times a day (BID) | ORAL | Status: DC
  Administered 2019-11-16 – 2019-11-18 (×3): 600 mg via ORAL
  Filled 2019-11-16 (×5): qty 1

## 2019-11-16 MED ORDER — NEPRO/CARBSTEADY PO LIQD: 237.0000 mL | Freq: Three times a day (TID) | ORAL | Status: DC | PRN

## 2019-11-16 MED ORDER — ADULT MULTIVITAMIN W/MINERALS CH
1.0000 | ORAL_TABLET | Freq: Every day | ORAL | Status: DC
  Administered 2019-11-16 – 2019-11-18 (×3): 1 via ORAL
  Filled 2019-11-16 (×3): qty 1

## 2019-11-16 MED ORDER — DULOXETINE HCL 60 MG PO CPEP
60.0000 mg | ORAL_CAPSULE | Freq: Every day | ORAL | Status: DC
  Administered 2019-11-16 – 2019-11-18 (×3): 60 mg via ORAL
  Filled 2019-11-16 (×3): qty 1

## 2019-11-16 MED ORDER — POLYETHYLENE GLYCOL 3350 17 G PO PACK
17.0000 g | PACK | Freq: Every day | ORAL | Status: DC | PRN
  Administered 2019-11-16 – 2019-11-17 (×2): 17 g via ORAL
  Filled 2019-11-16 (×2): qty 1

## 2019-11-16 MED ORDER — ATORVASTATIN CALCIUM 10 MG PO TABS
10.0000 mg | ORAL_TABLET | Freq: Every day | ORAL | Status: DC
  Administered 2019-11-16 – 2019-11-18 (×3): 10 mg via ORAL
  Filled 2019-11-16 (×3): qty 1

## 2019-11-16 MED ORDER — FLUTICASONE PROPIONATE 50 MCG/ACT NA SUSP: 2.0000 | Freq: Every day | NASAL | Status: DC | PRN

## 2019-11-16 MED ORDER — HYDROCHLOROTHIAZIDE 25 MG PO TABS: 25.0000 mg | ORAL_TABLET | Freq: Every day | ORAL | Status: DC

## 2019-11-16 MED ORDER — INSULIN GLARGINE 100 UNIT/ML ~~LOC~~ SOLN
15.0000 [IU] | Freq: Every day | SUBCUTANEOUS | Status: DC
  Administered 2019-11-17 – 2019-11-18 (×2): 15 [IU] via SUBCUTANEOUS
  Filled 2019-11-16 (×3): qty 0.15

## 2019-11-16 MED ORDER — HYDROCODONE-ACETAMINOPHEN 5-325 MG PO TABS
1.0000 | ORAL_TABLET | Freq: Four times a day (QID) | ORAL | Status: DC | PRN
  Administered 2019-11-16 – 2019-11-18 (×6): 2 via ORAL
  Filled 2019-11-16 (×6): qty 2

## 2019-11-16 MED ORDER — ENSURE MAX PROTEIN PO LIQD: 11.0000 [oz_av] | Freq: Two times a day (BID) | ORAL | Status: DC

## 2019-11-16 MED ORDER — HYDROMORPHONE HCL 1 MG/ML IJ SOLN
1.0000 mg | INTRAMUSCULAR | Status: DC | PRN
  Administered 2019-11-17 (×2): 1 mg via INTRAVENOUS
  Filled 2019-11-16 (×3): qty 1

## 2019-11-16 MED ORDER — ASPIRIN EC 81 MG PO TBEC
81.0000 mg | DELAYED_RELEASE_TABLET | Freq: Two times a day (BID) | ORAL | Status: DC
  Administered 2019-11-16 – 2019-11-18 (×5): 81 mg via ORAL
  Filled 2019-11-16 (×5): qty 1

## 2019-11-16 MED ORDER — ALUM & MAG HYDROXIDE-SIMETH 200-200-20 MG/5ML PO SUSP
30.0000 mL | Freq: Four times a day (QID) | ORAL | Status: DC | PRN
  Administered 2019-11-16: 30 mL via ORAL
  Filled 2019-11-16: qty 30

## 2019-11-16 MED ORDER — ENSURE MAX PROTEIN PO LIQD
11.0000 [oz_av] | Freq: Two times a day (BID) | ORAL | Status: DC
  Administered 2019-11-17 – 2019-11-18 (×3): 11 [oz_av] via ORAL
  Filled 2019-11-16 (×7): qty 330

## 2019-11-16 MED ORDER — DOCUSATE SODIUM 100 MG PO CAPS
100.0000 mg | ORAL_CAPSULE | Freq: Every day | ORAL | Status: DC
  Administered 2019-11-16 – 2019-11-18 (×3): 100 mg via ORAL
  Filled 2019-11-16 (×3): qty 1

## 2019-11-16 MED ORDER — VANCOMYCIN HCL IN DEXTROSE 1-5 GM/200ML-% IV SOLN
1000.0000 mg | INTRAVENOUS | Status: DC
  Administered 2019-11-16: 1000 mg via INTRAVENOUS
  Filled 2019-11-16: qty 200

## 2019-11-16 MED ORDER — METOCLOPRAMIDE HCL 5 MG/ML IJ SOLN
5.0000 mg | Freq: Four times a day (QID) | INTRAMUSCULAR | Status: DC
  Administered 2019-11-16 – 2019-11-18 (×8): 5 mg via INTRAVENOUS
  Filled 2019-11-16 (×8): qty 2

## 2019-11-16 NOTE — ED Notes (Signed)
Gave pt ice water, per Zelphia Cairo - Therapist, sports.

## 2019-11-16 NOTE — ED Notes (Signed)
Re dressed wound on both feet, with clean dry dressing.

## 2019-11-16 NOTE — ED Notes (Signed)
Pt given clear liquid tray.

## 2019-11-16 NOTE — Progress Notes (Signed)
Pharmacy Antibiotic Note  [name] [name] is a 64 y.o. adult admitted on 11/15/2019 with Septic joint .  Pharmacy has been consulted for Vancomycin dosing. Patient was receiving Vancomycin 1011m IV q48h as an outpatient for treatment of her septic joint. Patient was unable to tell me when her last Vancomycin dose was.      Temp (24hrs), Avg:98.6 F (37 C), Min:98.6 F (37 C), Max:98.6 F (37 C)  Recent Labs  Lab 11/10/19 1215 11/10/19 1215 11/11/19 1131 11/11/19 1411 11/12/19 1933 11/15/19 1439 11/16/19 0426 11/16/19 1302  WBC 5.6   < > 7.1 6.7 6.8 13.5* 7.6  --   CREATININE 1.62*   < > 1.59* 1.68* 1.65* 1.99* 1.84*  --   VANCOTROUGH 8*  --   --   --   --   --   --   --   VANCORANDOM  --   --   --   --   --   --   --  6   < > = values in this interval not displayed.    Estimated Creatinine Clearance (by C-G formula based on SCr of 1.84 mg/dL (H)) Female: 30.2 mL/min (A) Female: 37.1 mL/min (A)    Allergies  Allergen Reactions  . Bee Pollen Anaphylaxis  . Fish-Derived Products Hives, Shortness Of Breath, Swelling and Rash    Hives get in throat causing trouble breathing  . Mushroom Extract Complex Anaphylaxis  . Penicillins Anaphylaxis    Did it involve swelling of the face/tongue/throat, SOB, or low BP? Yes Did it involve sudden or severe rash/hives, skin peeling, or any reaction on the inside of your mouth or nose? No Did you need to seek medical attention at a hospital or doctor's office? Yes When did it last happen?A few months ago If all above answers are "NO", may proceed with cephalosporin use.  .[name] KitchenRosemary Oil Anaphylaxis  . Shellfish Allergy Hives, Shortness Of Breath, Swelling and Rash  . Tomato Hives and Shortness Of Breath    Hives in throat causes her trouble breathing  . Acetaminophen Other (See Comments)    GI upset  . Acyclovir And Related Other (See Comments)    Unknown rxn  . Aloe Vera Hives  . Broccoli [Brassica Oleracea] Hives  . Naproxen Other  (See Comments)    Unknown rxn    Antimicrobials this admission: 1/17 Vancomycin >>     Dose adjustments this admission:   Microbiology results: - Has grown Klebsiella, Enterococcus, and Pseudomonas from heel cultures   Plan:  - Random vancomycin level is 8 - Will start Vancomycin 10068mIV q48hr  - Est Calc AUC 514 - Monitor patients renal function and levels as needed   Thank you for allowing pharmacy to be a part of this patient's care.  JaDuanne LimerickPharmD. BCPS  11/16/2019 2:56 PM

## 2019-11-16 NOTE — ED Notes (Signed)
Pt. Requesting something for nausea. And pain

## 2019-11-16 NOTE — ED Notes (Signed)
Pt. Stated, Nausea better

## 2019-11-16 NOTE — Progress Notes (Signed)
Progress Note    [name] [name]  FMB:[id number] DOB: 1956-03-23  DOA: 11/15/2019 PCP: Ann Held, DO    Brief Narrative:     Medical records reviewed and are as summarized below:  [name] [name] is an 64 y.o. 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 Diabetic gastroparesis -IV reglan -Compazine 37m iv q6h prn  Pepcid 138miv qday Clear liquid diet for now  CKD stage IIIb (h/o renal transplant? Not on meds, CT Scan not reflective of transplant) -normal GFR 35 -current 29  Septic arthritis right elbow Last dose of vanco Friday nite per husband,  Will require next dose on Sunday.  -continue vanc with close monitoring of her Cr -has appointment with Dr. CaMegan Salonn Monday PM-- can discuss with him tomorrow if medication needs to be changed  Hypertension Hold HCTZ  Hyperlipidemia Cont Lipitor 104mo qhs  Poor controlled Dm2 due to hyperglycemia w neuropathy SSI -low dose lantus for now Cont Lyrica  Iron deficiency anemia HOLD iron -can use PRN IV Fe  Anxiety/ PTSD -Cont Cymbalta 18m24m qday (note this can sometimes cause nausea but I suspect her N/V is more related to her uncontrolled blood sugars)  Severe protein calorie malutrition -nutritional supplements  Microscopic hematuria -outpatient follow up   Family Communication/Anticipated D/C date and plan/Code Status   DVT  prophylaxis: Lovenox ordered. Code Status: Full Code.  Family Communication:  Disposition Plan: needs IV fluid and control of nausea   Medical Consultants:    None.     Subjective:   Not able to tolerate any food by mouth  Objective:    Vitals:   11/16/19 0600 11/16/19 0615 11/16/19 0645 11/16/19 0700  BP: (!) 174/69 (!) 176/72 (!) 146/61 (!) 144/50  Pulse: 97 98 99 96  Resp: 19 18 (!) 22 17  Temp:      TempSrc:      SpO2: 97% 99% 96% 95%    Intake/Output Summary (Last 24 hours) at 11/16/2019 1104 Last data filed at 11/15/2019 1823 Gross per 24 hour  Intake 2000 ml  Output --  Net 2000 ml   There were no vitals filed for this visit.  Exam: In bed, acute on chronically ill appearing- not toxic Not engaging Not cooperative for neuro exam rrr No increased work of breathing  Data Reviewed:   I have personally reviewed following labs and imaging studies:  Labs: Labs show the following:   Basic Metabolic Panel: Recent Labs  Lab 11/11/19 1131 11/11/19 1131 11/11/19 1411 11/11/19 1411 11/12/19 1933 11/12/19 1933 11/15/19 1439 11/16/19 0426  NA 133*  --  133*  --  134*  --  133* 137  K 4.0   < > 3.6   < > 3.7   < > 3.8 3.5  CL 97  --  98  --  100  --  98 101  CO2 27  --  22  --  21*  --  25 28  GLUCOSE 409*  --  423*  --  462*  --  317* 226*  BUN 20  --  22  --  22  --  31* 31*  CREATININE 1.59*  --  1.68*  --  1.65*  --  1.99* 1.84*  CALCIUM 9.5  --  8.9  --  9.2  --  8.6* 8.1*   < > = values in this interval not displayed.   GFR Estimated Creatinine Clearance (by C-G formula based on SCr of 1.84 mg/dL (H)) Female: 30.2 mL/min (A) Female: 37.1 mL/min (A) Liver Function Tests: Recent Labs  Lab 11/11/19 1131 11/11/19 1411 11/12/19 1933 11/15/19 1439 11/16/19 0426  AST 19 22 20  33 26  ALT 16 20 19 23 20   ALKPHOS 125* 118 111 120 96  BILITOT 0.8 1.2 1.3* 1.4* 1.2  PROT 8.2 8.2* 8.1 7.3 6.0*  ALBUMIN 3.7 3.5 3.5 2.9* 2.3*   Recent Labs   Lab 11/12/19 1933 11/15/19 1439  LIPASE 33 26   No results for input(s): AMMONIA in the last 168 hours. Coagulation profile No results for input(s): INR, PROTIME in the last 168 hours.  CBC: Recent Labs  Lab 11/11/19 1131 11/11/19 1411 11/12/19 1933 11/15/19 1439 11/16/19 0426  WBC 7.1 6.7 6.8 13.5* 7.6  NEUTROABS 5.5 5.3 5.7  --   --   HGB 12.6 12.6 12.5 13.6 11.2*  HCT 38.3 38.8 37.9 41.1 35.6*  MCV 84.0 83.8 84.0 83.0 86.6  PLT 135.0* 124* 146* 175 123*   Cardiac Enzymes: Recent Labs  Lab 11/10/19 1215  CKTOTAL 109   BNP (last 3 results) No results for input(s): PROBNP in the last 8760 hours. CBG: Recent Labs  Lab 11/11/19 1317 11/15/19 2330 11/16/19 0305 11/16/19 0828  GLUCAP 404* 262* 216* 205*   D-Dimer: No results for input(s): DDIMER in the last 72 hours. Hgb A1c: No results for input(s): HGBA1C in the last 72 hours. Lipid Profile: No results for input(s): CHOL, HDL, LDLCALC, TRIG, CHOLHDL, LDLDIRECT in the last 72 hours. Thyroid function studies: No results for input(s): TSH, T4TOTAL, T3FREE, THYROIDAB in the last 72 hours.  Invalid input(s): FREET3 Anemia work up: No results for input(s): VITAMINB12, FOLATE, FERRITIN, TIBC, IRON, RETICCTPCT in the last 72 hours. Sepsis Labs: Recent Labs  Lab 11/11/19 1411 11/12/19 1933 11/15/19 1439 11/16/19 0426  WBC 6.7 6.8 13.5* 7.6    Microbiology Recent Results (from the past 240 hour(s))  Urine culture     Status: Abnormal   Collection Time: 11/11/19  4:55 PM   Specimen: Urine, Random  Result Value Ref Range Status   Specimen Description   Final    URINE, RANDOM Performed at [facility], [address] [place], [place] [address]    Special Requests   Final    NONE Performed at [facility], [address] [place], [place] [address]    Culture 20,000 COLONIES/mL KLEBSIELLA PNEUMONIAE (A)  Final   Report Status 11/13/2019 FINAL  Final   Organism ID, Bacteria  KLEBSIELLA PNEUMONIAE (A)  Final      Susceptibility   Klebsiella pneumoniae - MIC*    AMPICILLIN >=32 RESISTANT Resistant     CEFAZOLIN <=4 SENSITIVE Sensitive     CEFTRIAXONE <=0.25 SENSITIVE Sensitive     CIPROFLOXACIN <=0.25 SENSITIVE Sensitive     GENTAMICIN <=1 SENSITIVE Sensitive     IMIPENEM <=0.25 SENSITIVE Sensitive  NITROFURANTOIN 128 RESISTANT Resistant     TRIMETH/SULFA <=20 SENSITIVE Sensitive     AMPICILLIN/SULBACTAM 8 SENSITIVE Sensitive     PIP/TAZO <=4 SENSITIVE Sensitive     * 20,000 COLONIES/mL KLEBSIELLA PNEUMONIAE  SARS CORONAVIRUS 2 (TAT 6-24 HRS) Nasopharyngeal Nasopharyngeal Swab     Status: None   Collection Time: 11/15/19  9:53 PM   Specimen: Nasopharyngeal Swab  Result Value Ref Range Status   SARS Coronavirus 2 NEGATIVE NEGATIVE Final    Comment: (NOTE) SARS-CoV-2 target nucleic acids are NOT DETECTED. The SARS-CoV-2 RNA is generally detectable in upper and lower respiratory specimens during the acute phase of infection. Negative results do not preclude SARS-CoV-2 infection, do not rule out co-infections with other pathogens, and should not be used as the sole basis for treatment or other patient management decisions. Negative results must be combined with clinical observations, patient history, and epidemiological information. The expected result is Negative. Fact Sheet for Patients: SugarRoll.be Fact Sheet for Healthcare Providers: https://www.woods-mathews.com/ This test is not yet approved or cleared by the [place] FDA and  has been authorized for detection and/or diagnosis of SARS-CoV-2 by FDA under an Emergency Use Authorization (EUA). This EUA will remain  in effect (meaning this test can be used) for the duration of the COVID-19 declaration under Section 56 4(b)(1) of the Act, 21 U.S.C. section 360bbb-3(b)(1), unless the authorization is terminated or revoked sooner. Performed at [facility], [place] [address] [place], [place] [address]     Procedures and diagnostic studies:  CT ABDOMEN PELVIS WO CONTRAST  Result Date: 11/15/2019 CLINICAL DATA:  Nausea and vomiting. Right rib pain EXAM: CT ABDOMEN AND PELVIS WITHOUT CONTRAST TECHNIQUE: Multidetector CT imaging of the abdomen and pelvis was performed following the standard protocol without IV contrast. COMPARISON:  November 11, 2019 FINDINGS: Lower chest: The lung bases are clear. The heart size is normal. Hepatobiliary: Again noted is a cirrhotic appearance of the liver with multiple small calcifications likely related to a prior granulomatous infection. Status post cholecystectomy.There is no biliary ductal dilation. Pancreas: Normal contours without ductal dilatation. No peripancreatic fluid collection. Spleen: The spleen is enlarged. Adrenals/Urinary Tract: --Adrenal glands: No adrenal hemorrhage. --Right kidney/ureter: No hydronephrosis or perinephric hematoma. --Left kidney/ureter: No hydronephrosis or perinephric hematoma. --Urinary bladder: The urinary bladder is moderately distended. Stomach/Bowel: --Stomach/Duodenum: No hiatal hernia or other gastric abnormality. Normal duodenal course and caliber. --Small bowel: No dilatation or inflammation. --Colon: No focal abnormality. --Appendix: Normal. Vascular/Lymphatic: Atherosclerotic calcification is present within the non-aneurysmal abdominal aorta, without hemodynamically significant stenosis. --No retroperitoneal lymphadenopathy. --No mesenteric lymphadenopathy. --again noted is mild pelvic adenopathy, similar to prior study. Reproductive: Status post hysterectomy. No adnexal mass. Other: No ascites or free air. The abdominal wall is normal. Musculoskeletal. Again noted is a fracture of the L1 vertebral body. This has slightly advanced and now involves the inferior endplate and posterior wall with minimal retropulsion. There is no new acute fracture. IMPRESSION: 1. Again noted is an  acute compression fracture of the L1 vertebral body. There has been slight interval worsening of this fracture, which now involves the inferior endplate and extends into the posterior wall. There is minimal retropulsion at this time. 2. Cirrhosis with stigmata of portal hypertension. 3. Additional chronic findings as detailed above. Aortic Atherosclerosis (ICD10-I70.0). Electronically Signed   By: Constance Holster M.D.   On: 11/15/2019 19:08    Medications:   . aspirin EC  81 mg Oral BID  . atorvastatin  10 mg Oral  Daily  . docusate sodium  100 mg Oral Daily  . DULoxetine  60 mg Oral Daily  . enoxaparin (LOVENOX) injection  30 mg Subcutaneous Q24H  . guaiFENesin  600 mg Oral BID  . insulin aspart  0-9 Units Subcutaneous Q4H  . metoCLOPramide (REGLAN) injection  5 mg Intravenous Q6H  . multivitamin with minerals  1 tablet Oral Daily  . pregabalin  150 mg Oral BID  . Ensure Max Protein  11 oz Oral BID BM   Continuous Infusions: . sodium chloride 75 mL/hr at 11/15/19 2303  . famotidine (PEPCID) IV Stopped (11/15/19 2333)     LOS: 0 days   Geradine Girt  [facility]   How to contact the [facility] Attending or Consulting provider [place] or covering provider during after hours [place], for this patient?  1. Check the care team in [facility] and look for a) attending/consulting [facility] provider listed and b) the [facility] team listed 2. Log into [email or web address] and use [place]'s universal password to access. If you do not have the password, please contact the hospital operator. 3. Locate the [facility] provider you are looking for under [facility] and page to a number that you can be directly reached. 4. If you still have difficulty reaching the provider, please page the [facility] (Director on Call) for the Hospitalists listed on amion for assistance.  11/16/2019, 11:04 AM

## 2019-11-17 ENCOUNTER — Encounter (HOSPITAL_COMMUNITY): Payer: Self-pay | Admitting: Internal Medicine

## 2019-11-17 ENCOUNTER — Ambulatory Visit: Payer: PRIVATE HEALTH INSURANCE | Admitting: Internal Medicine

## 2019-11-17 DIAGNOSIS — Z886 Allergy status to analgesic agent status: Secondary | ICD-10-CM

## 2019-11-17 DIAGNOSIS — M00021 Staphylococcal arthritis, right elbow: Secondary | ICD-10-CM

## 2019-11-17 DIAGNOSIS — Z91018 Allergy to other foods: Secondary | ICD-10-CM

## 2019-11-17 DIAGNOSIS — Z888 Allergy status to other drugs, medicaments and biological substances status: Secondary | ICD-10-CM

## 2019-11-17 DIAGNOSIS — L97419 Non-pressure chronic ulcer of right heel and midfoot with unspecified severity: Secondary | ICD-10-CM

## 2019-11-17 DIAGNOSIS — Z91013 Allergy to seafood: Secondary | ICD-10-CM

## 2019-11-17 DIAGNOSIS — Z91030 Bee allergy status: Secondary | ICD-10-CM

## 2019-11-17 DIAGNOSIS — S32010A Wedge compression fracture of first lumbar vertebra, initial encounter for closed fracture: Secondary | ICD-10-CM | POA: Diagnosis present

## 2019-11-17 DIAGNOSIS — Z880 Allergy status to penicillin: Secondary | ICD-10-CM

## 2019-11-17 DIAGNOSIS — M549 Dorsalgia, unspecified: Secondary | ICD-10-CM

## 2019-11-17 DIAGNOSIS — R112 Nausea with vomiting, unspecified: Secondary | ICD-10-CM

## 2019-11-17 DIAGNOSIS — M8448XA Pathological fracture, other site, initial encounter for fracture: Secondary | ICD-10-CM

## 2019-11-17 DIAGNOSIS — K7460 Unspecified cirrhosis of liver: Secondary | ICD-10-CM

## 2019-11-17 DIAGNOSIS — S2249XA Multiple fractures of ribs, unspecified side, initial encounter for closed fracture: Secondary | ICD-10-CM | POA: Diagnosis present

## 2019-11-17 DIAGNOSIS — B9561 Methicillin susceptible Staphylococcus aureus infection as the cause of diseases classified elsewhere: Secondary | ICD-10-CM

## 2019-11-17 HISTORY — DX: Dorsalgia, unspecified: M54.9

## 2019-11-17 LAB — GLUCOSE, CAPILLARY
Glucose-Capillary: 115 mg/dL — ABNORMAL HIGH (ref 70–99)
Glucose-Capillary: 119 mg/dL — ABNORMAL HIGH (ref 70–99)
Glucose-Capillary: 155 mg/dL — ABNORMAL HIGH (ref 70–99)
Glucose-Capillary: 167 mg/dL — ABNORMAL HIGH (ref 70–99)
Glucose-Capillary: 189 mg/dL — ABNORMAL HIGH (ref 70–99)
Glucose-Capillary: 227 mg/dL — ABNORMAL HIGH (ref 70–99)
Glucose-Capillary: 265 mg/dL — ABNORMAL HIGH (ref 70–99)

## 2019-11-17 LAB — HEMOGLOBIN A1C
Hgb A1c MFr Bld: 12.5 % — ABNORMAL HIGH (ref 4.8–5.6)
Mean Plasma Glucose: 312.05 mg/dL

## 2019-11-17 MED ORDER — SODIUM CHLORIDE 0.9 % IV SOLN
100.0000 mg | Freq: Two times a day (BID) | INTRAVENOUS | Status: DC
  Administered 2019-11-17 – 2019-11-18 (×2): 100 mg via INTRAVENOUS
  Filled 2019-11-17 (×4): qty 100

## 2019-11-17 MED ORDER — CHLORHEXIDINE GLUCONATE CLOTH 2 % EX PADS
6.0000 | MEDICATED_PAD | Freq: Every day | CUTANEOUS | Status: DC
  Administered 2019-11-17 – 2019-11-18 (×2): 6 via TOPICAL

## 2019-11-17 NOTE — Consult Note (Signed)
[place] for Infectious Disease       Reason for Consult:  Septic arthritis of right elbow   Referring Physician: Dr. Eliseo Squires  Principal Problem:   Intractable nausea and vomiting Active Problems:   Essential hypertension   Hepatic cirrhosis (HCC)   CAD (coronary artery disease)   CKD (chronic kidney disease), stage III (HCC)   Diabetic peripheral neuropathy (HCC)   Sleep apnea   Septic arthritis of elbow, right ([place])   ARF (acute renal failure) (HCC)   Back pain   Compression fracture of L1 lumbar vertebra (HCC)   Fracture of multiple ribs   . aspirin EC  81 mg Oral BID  . atorvastatin  10 mg Oral Daily  . Chlorhexidine Gluconate Cloth  6 each Topical Daily  . docusate sodium  100 mg Oral Daily  . DULoxetine  60 mg Oral Daily  . enoxaparin (LOVENOX) injection  30 mg Subcutaneous Q24H  . guaiFENesin  600 mg Oral BID  . insulin aspart  0-9 Units Subcutaneous Q4H  . insulin glargine  15 Units Subcutaneous Daily  . metoCLOPramide (REGLAN) injection  5 mg Intravenous Q6H  . multivitamin with minerals  1 tablet Oral Daily  . pregabalin  150 mg Oral BID  . Ensure Max Protein  11 oz Oral BID BM    Recommendations: Stop vancomycin  Doxycycline 100 mg bid - I will use IV therapy for now with her n/v but can be converted to oral at discharge.  Will need for at least one month.   OK from an ID standpoint at discharge to remove the picc line Will arrange follow up with Dr. 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  She has been on long courses of antibiotics made more difficult with intolerance to antibiotics.  At this time, her elbow is closed, no drainage, no swelling, warmth or redness.  Will stop her vancoomycin now and place her on a suppressive oral antibiotic until she is seen by the orthopedic team at [facility].  She unfortunately missed her recent appt with them but tells me she has another follow up scheduled.     Antibiotics: vancomycin  HPI: [name] [name] is a 64 y.o. adult with a history of a chronic right elbow infection and poorly healing right heel ulcer.  Her elbow did grow MSSA and she has had long courses of antibiotics and most recently on daptomycin but did not tolerate.  Recent Hgb A1c was 12.5.  She now has significant back pain and found to have worsening of her compression fracture at L1.  She is also having significant n/v.  She has remained on vancomycin though levels have been low.  She also has a history of cirrhosis.    Review of Systems:  Constitutional: negative for fevers and chills Gastrointestinal: positive for nausea and vomiting Integument/breast: negative for rash All other systems reviewed and are negative    Past Medical History:  Diagnosis Date  . Acute MI [facility]) 2007   presented to ED & had cardiac cath- but found to have normal coronaries. Since that point in time her PCP cares f or cardiac needs. Dr. [name] - [facility]  . Anemia   . Anginal pain ([place])   . Anxiety   . Asthma   . Bulging lumbar disc   . Cataract   . Chronic kidney disease    "had transplant when I was 15; doesn't bother me now" (  03/20/2013)  . Cirrhosis of liver without mention of alcohol   . Constipation   . Dehiscence of closure of skin    left partial calcaneal excision  . Depression   . Diabetes mellitus    insulin dependent, adult onset  . Episode of visual loss of left eye   . Exertional shortness of breath   . Fatty liver   . Fibromyalgia   . GERD (gastroesophageal reflux disease)   . Hepatic steatosis   . High cholesterol   . Hypertension   . MRSA (methicillin resistant Staphylococcus aureus)   . Neuropathy    lower legs  . Osteoarthritis    hands, hips  . Proximal humerus fracture 10/15/12   Left  . PTSD (post-traumatic stress disorder)   . Renal insufficiency 05/05/2015    Social History   Tobacco Use  . Smoking status: Never Smoker  . Smokeless tobacco:  Never Used  Substance Use Topics  . Alcohol use: No    Alcohol/week: 0.0 standard drinks  . Drug use: No    Family History  Problem Relation Age of Onset  . Heart disease Father   . Diabetes Father   . Colitis Father   . Crohn's disease Father   . Cancer Father        leukemia  . Leukemia Father   . Diabetes Mellitus II Brother   . Kidney disease Brother   . Heart disease Brother   . Diabetes Mother   . Hypertension Mother   . Mental illness Mother   . Irritable bowel syndrome Daughter   . Diabetes Mellitus II Brother   . Kidney disease Brother   . Liver disease Brother   . Kidney disease Brother   . Heart attack Brother   . Diabetes Mellitus II Brother   . Heart disease Brother   . Liver disease Brother   . Kidney disease Brother   . Kidney disease Brother   . Diabetes Mellitus II Brother   . Diabetes Mellitus I Brother     Allergies  Allergen Reactions  . Bee Pollen Anaphylaxis  . Fish-Derived Products Hives, Shortness Of Breath, Swelling and Rash    Hives get in throat causing trouble breathing  . Mushroom Extract Complex Anaphylaxis  . Penicillins Anaphylaxis    Did it involve swelling of the face/tongue/throat, SOB, or low BP? Yes Did it involve sudden or severe rash/hives, skin peeling, or any reaction on the inside of your mouth or nose? No Did you need to seek medical attention at a hospital or doctor's office? Yes When did it last happen?A few months ago If all above answers are "NO", may proceed with cephalosporin use.  [name] Kitchen Rosemary Oil Anaphylaxis  . Shellfish Allergy Hives, Shortness Of Breath, Swelling and Rash  . Tomato Hives and Shortness Of Breath    Hives in throat causes her trouble breathing  . Acetaminophen Other (See Comments)    GI upset  . Acyclovir And Related Other (See Comments)    Unknown rxn  . Aloe Vera Hives  . Broccoli [Brassica Oleracea] Hives  . Naproxen Other (See Comments)    Unknown rxn    Physical  Exam: Constitutional: in no apparent distress  Vitals:   11/17/19 0433 11/17/19 1430  BP:  (!) 148/67  Pulse:  82  Resp:  20  Temp:  99.2 F (37.3 C)  SpO2: 95% 95%   EYES: anicteric Cardiovascular: Cor RRR Respiratory: clear; Musculoskeletal: right elbow at the incision site shows a  well-healed incision, no opening, no drainage, no tenderness, no erythema, no warmth or edema.   Leg is wrapped and she did not tolerate short attempts at unwrapping due to her back pain Skin: negatives: no rash  Lab Results  Component Value Date   WBC 7.6 11/16/2019   HGB 11.2 (L) 11/16/2019   HCT 35.6 (L) 11/16/2019   MCV 86.6 11/16/2019   PLT 123 (L) 11/16/2019    Lab Results  Component Value Date   CREATININE 1.84 (H) 11/16/2019   BUN 31 (H) 11/16/2019   NA 137 11/16/2019   K 3.5 11/16/2019   CL 101 11/16/2019   CO2 28 11/16/2019    Lab Results  Component Value Date   ALT 20 11/16/2019   AST 26 11/16/2019   ALKPHOS 96 11/16/2019     Microbiology: Recent Results (from the past 240 hour(s))  Urine culture     Status: Abnormal   Collection Time: 11/11/19  4:55 PM   Specimen: Urine, Random  Result Value Ref Range Status   Specimen Description   Final    URINE, RANDOM Performed at [facility], [address] [place], [place] [address]    Special Requests   Final    NONE Performed at [facility], [address] [place], [place] [address]    Culture 20,000 COLONIES/mL KLEBSIELLA PNEUMONIAE (A)  Final   Report Status 11/13/2019 FINAL  Final   Organism ID, Bacteria KLEBSIELLA PNEUMONIAE (A)  Final      Susceptibility   Klebsiella pneumoniae - MIC*    AMPICILLIN >=32 RESISTANT Resistant     CEFAZOLIN <=4 SENSITIVE Sensitive     CEFTRIAXONE <=0.25 SENSITIVE Sensitive     CIPROFLOXACIN <=0.25 SENSITIVE Sensitive     GENTAMICIN <=1 SENSITIVE Sensitive     IMIPENEM <=0.25 SENSITIVE Sensitive     NITROFURANTOIN 128 RESISTANT Resistant     TRIMETH/SULFA  <=20 SENSITIVE Sensitive     AMPICILLIN/SULBACTAM 8 SENSITIVE Sensitive     PIP/TAZO <=4 SENSITIVE Sensitive     * 20,000 COLONIES/mL KLEBSIELLA PNEUMONIAE  SARS CORONAVIRUS 2 (TAT 6-24 HRS) Nasopharyngeal Nasopharyngeal Swab     Status: None   Collection Time: 11/15/19  9:53 PM   Specimen: Nasopharyngeal Swab  Result Value Ref Range Status   SARS Coronavirus 2 NEGATIVE NEGATIVE Final    Comment: (NOTE) SARS-CoV-2 target nucleic acids are NOT DETECTED. The SARS-CoV-2 RNA is generally detectable in upper and lower respiratory specimens during the acute phase of infection. Negative results do not preclude SARS-CoV-2 infection, do not rule out co-infections with other pathogens, and should not be used as the sole basis for treatment or other patient management decisions. Negative results must be combined with clinical observations, patient history, and epidemiological information. The expected result is Negative. Fact Sheet for Patients: SugarRoll.be Fact Sheet for Healthcare Providers: https://www.woods-mathews.com/ This test is not yet approved or cleared by the [place] FDA and  has been authorized for detection and/or diagnosis of SARS-CoV-2 by FDA under an Emergency Use Authorization (EUA). This EUA will remain  in effect (meaning this test can be used) for the duration of the COVID-19 declaration under Section 56 4(b)(1) of the Act, 21 U.S.C. section 360bbb-3(b)(1), unless the authorization is terminated or revoked sooner. Performed at [facility], [place] [address] [place], [place] [address]     Lindy Garczynski W Katsumi Wisler, [place] for Infectious Disease [facility] [email or web address] 11/17/2019, 2:32 PM

## 2019-11-17 NOTE — Progress Notes (Addendum)
[facility] PROGRESS NOTE  [name] [name] BPZ:[id number] DOB: Mar 14, 1956 DOA: 11/15/2019 PCP: Ann Held, DO  Assessment/Plan:  1.  Intractable nausea and vomiting.  May be related to diabetic gastroparesis.  Provided with scheduled Reglan.  Tolerating clear liquids without issue. -Continue scheduled Reglan -advance diet to full liquids -Compazine as needed -Continue Pepcid -Monitor  #2.  Acute kidney injury superimposed on chronic kidney disease stage III.  History of renal transplant but CT scan not reflective and not on any medications. -creatinine stable at what appears to be baseline  #3.  Back pain/acute compression fracture of L1/right-sided rib fractures dated to a recent fall.  CT reveals slight interval worsening of the L1 fracture.  She clearly has good reason for back pain but given her history septic joint and bacteremia some concern for infectious process.  Currently is undergoing outpatient therapy for septic joint of the right elbow with vancomycin.  She has an appointment with infectious disease today. -Mobilize -physical therapy -Pain control -defer any further imaging of back to ID  #4.  Septic arthritis of the right elbow.  She had a dose of vancomycin yesterday (Sunday 1/17).  She is afebrile nontoxic-appearing. -ID consult requested for evaluation of need to change anti-biotics -continue vanc for now  #5.  Hypertension.  Blood pressure remains on the high end of normal.  Home meds include HCTZ which has been on hold.  -continue to hold HCTZ -monitor closely -resume HCTZ as indicated  6.  Hyperlipidemia -Continue home Lipitor  #7. Anemia. Hg 11.2 yesterday trending down slightly -recheck in am -holding supplemental iron  #8.  Anxiety/PTSD.  Home medications include Cymbalta.  Appears stable at baseline -Continue home meds  #9.  Microscopic hematuria -Outpatient follow-up  #10. Diabetes. humalog TID. Uncontrolled. A1c greater than 12  6 months ago.  -SSI for optimal control -A1c  Code Status: full Family Communication: patient Disposition Plan: home when ready hopefully 24-36 hours   Consultants: [facility]  Procedures:   Antibiotics: Vancomycin (continuation of OP therapy  HPI/Subjective: Lying on side. Complains continued back pain. Reports tolerating clear liquids without problem and wanting to advance diet  Objective: Vitals:   11/17/19 0430 11/17/19 0433  BP: (!) 176/68   Pulse: 89   Resp: 15   Temp: 98.4 F (36.9 C)   SpO2: 91% 95%    Intake/Output Summary (Last 24 hours) at 11/17/2019 1053 Last data filed at 11/17/2019 0715 Gross per 24 hour  Intake 1895 ml  Output 250 ml  Net 1645 ml   Filed Weights   11/17/19 0433  Weight: 87.5 kg    Exam:  General:  Awake alert no acute distress, not interested in talking Cardiovascular: Regular rate and rhythm no murmur gallop or rub no lower extremity edema Respiratory: Normal effort respirations slightly shallow good air movement I hear no wheezes no rhonchi Abdomen: Obese soft positive bowel sounds but sluggish no guarding or rebounding Musculoskeletal: joints without swelling/erythema    Data Reviewed: Basic Metabolic Panel: Recent Labs  Lab 11/11/19 1131 11/11/19 1411 11/12/19 1933 11/15/19 1439 11/16/19 0426  NA 133* 133* 134* 133* 137  K 4.0 3.6 3.7 3.8 3.5  CL 97 98 100 98 101  CO2 27 22 21* 25 28  GLUCOSE 409* 423* 462* 317* 226*  BUN 20 22 22  31* 31*  CREATININE 1.59* 1.68* 1.65* 1.99* 1.84*  CALCIUM 9.5 8.9 9.2 8.6* 8.1*   Liver Function Tests: Recent Labs  Lab 11/11/19 1131 11/11/19 1411  11/12/19 1933 11/15/19 1439 11/16/19 0426  AST 19 22 20  33 26  ALT 16 20 19 23 20   ALKPHOS 125* 118 111 120 96  BILITOT 0.8 1.2 1.3* 1.4* 1.2  PROT 8.2 8.2* 8.1 7.3 6.0*  ALBUMIN 3.7 3.5 3.5 2.9* 2.3*   Recent Labs  Lab 11/12/19 1933 11/15/19 1439  LIPASE 33 26   No results for input(s): AMMONIA in the last 168  hours. CBC: Recent Labs  Lab 11/11/19 1131 11/11/19 1411 11/12/19 1933 11/15/19 1439 11/16/19 0426  WBC 7.1 6.7 6.8 13.5* 7.6  NEUTROABS 5.5 5.3 5.7  --   --   HGB 12.6 12.6 12.5 13.6 11.2*  HCT 38.3 38.8 37.9 41.1 35.6*  MCV 84.0 83.8 84.0 83.0 86.6  PLT 135.0* 124* 146* 175 123*   Cardiac Enzymes: Recent Labs  Lab 11/10/19 1215  CKTOTAL 109   BNP (last 3 results) No results for input(s): BNP in the last 8760 hours.  ProBNP (last 3 results) No results for input(s): PROBNP in the last 8760 hours.  CBG: Recent Labs  Lab 11/16/19 1703 11/16/19 2027 11/17/19 0001 11/17/19 0426 11/17/19 0742  GLUCAP 202* 199* 227* 189* 167*    Recent Results (from the past 240 hour(s))  Urine culture     Status: Abnormal   Collection Time: 11/11/19  4:55 PM   Specimen: Urine, Random  Result Value Ref Range Status   Specimen Description   Final    URINE, RANDOM Performed at [facility], [address] [place], [place] [address]    Special Requests   Final    NONE Performed at [facility], [address] [place], [place] [address]    Culture 20,000 COLONIES/mL KLEBSIELLA PNEUMONIAE (A)  Final   Report Status 11/13/2019 FINAL  Final   Organism ID, Bacteria KLEBSIELLA PNEUMONIAE (A)  Final      Susceptibility   Klebsiella pneumoniae - MIC*    AMPICILLIN >=32 RESISTANT Resistant     CEFAZOLIN <=4 SENSITIVE Sensitive     CEFTRIAXONE <=0.25 SENSITIVE Sensitive     CIPROFLOXACIN <=0.25 SENSITIVE Sensitive     GENTAMICIN <=1 SENSITIVE Sensitive     IMIPENEM <=0.25 SENSITIVE Sensitive     NITROFURANTOIN 128 RESISTANT Resistant     TRIMETH/SULFA <=20 SENSITIVE Sensitive     AMPICILLIN/SULBACTAM 8 SENSITIVE Sensitive     PIP/TAZO <=4 SENSITIVE Sensitive     * 20,000 COLONIES/mL KLEBSIELLA PNEUMONIAE  SARS CORONAVIRUS 2 (TAT 6-24 HRS) Nasopharyngeal Nasopharyngeal Swab     Status: None   Collection Time: 11/15/19  9:53 PM   Specimen: Nasopharyngeal Swab   Result Value Ref Range Status   SARS Coronavirus 2 NEGATIVE NEGATIVE Final    Comment: (NOTE) SARS-CoV-2 target nucleic acids are NOT DETECTED. The SARS-CoV-2 RNA is generally detectable in upper and lower respiratory specimens during the acute phase of infection. Negative results do not preclude SARS-CoV-2 infection, do not rule out co-infections with other pathogens, and should not be used as the sole basis for treatment or other patient management decisions. Negative results must be combined with clinical observations, patient history, and epidemiological information. The expected result is Negative. Fact Sheet for Patients: SugarRoll.be Fact Sheet for Healthcare Providers: https://www.woods-mathews.com/ This test is not yet approved or cleared by the [place] FDA and  has been authorized for detection and/or diagnosis of SARS-CoV-2 by FDA under an Emergency Use Authorization (EUA). This EUA will remain  in effect (meaning this test can be  used) for the duration of the COVID-19 declaration under Section 56 4(b)(1) of the Act, 21 U.S.C. section 360bbb-3(b)(1), unless the authorization is terminated or revoked sooner. Performed at [facility], [place] [address] [place], [place] [address]      Studies: CT ABDOMEN PELVIS WO CONTRAST  Result Date: 11/15/2019 CLINICAL DATA:  Nausea and vomiting. Right rib pain EXAM: CT ABDOMEN AND PELVIS WITHOUT CONTRAST TECHNIQUE: Multidetector CT imaging of the abdomen and pelvis was performed following the standard protocol without IV contrast. COMPARISON:  November 11, 2019 FINDINGS: Lower chest: The lung bases are clear. The heart size is normal. Hepatobiliary: Again noted is a cirrhotic appearance of the liver with multiple small calcifications likely related to a prior granulomatous infection. Status post cholecystectomy.There is no biliary ductal dilation. Pancreas: Normal contours without ductal  dilatation. No peripancreatic fluid collection. Spleen: The spleen is enlarged. Adrenals/Urinary Tract: --Adrenal glands: No adrenal hemorrhage. --Right kidney/ureter: No hydronephrosis or perinephric hematoma. --Left kidney/ureter: No hydronephrosis or perinephric hematoma. --Urinary bladder: The urinary bladder is moderately distended. Stomach/Bowel: --Stomach/Duodenum: No hiatal hernia or other gastric abnormality. Normal duodenal course and caliber. --Small bowel: No dilatation or inflammation. --Colon: No focal abnormality. --Appendix: Normal. Vascular/Lymphatic: Atherosclerotic calcification is present within the non-aneurysmal abdominal aorta, without hemodynamically significant stenosis. --No retroperitoneal lymphadenopathy. --No mesenteric lymphadenopathy. --again noted is mild pelvic adenopathy, similar to prior study. Reproductive: Status post hysterectomy. No adnexal mass. Other: No ascites or free air. The abdominal wall is normal. Musculoskeletal. Again noted is a fracture of the L1 vertebral body. This has slightly advanced and now involves the inferior endplate and posterior wall with minimal retropulsion. There is no new acute fracture. IMPRESSION: 1. Again noted is an acute compression fracture of the L1 vertebral body. There has been slight interval worsening of this fracture, which now involves the inferior endplate and extends into the posterior wall. There is minimal retropulsion at this time. 2. Cirrhosis with stigmata of portal hypertension. 3. Additional chronic findings as detailed above. Aortic Atherosclerosis (ICD10-I70.0). Electronically Signed   By: Constance Holster M.D.   On: 11/15/2019 19:08    Scheduled Meds:  aspirin EC  81 mg Oral BID   atorvastatin  10 mg Oral Daily   Chlorhexidine Gluconate Cloth  6 each Topical Daily   docusate sodium  100 mg Oral Daily   DULoxetine  60 mg Oral Daily   enoxaparin (LOVENOX) injection  30 mg Subcutaneous Q24H   guaiFENesin  600 mg Oral  BID   insulin aspart  0-9 Units Subcutaneous Q4H   insulin glargine  15 Units Subcutaneous Daily   metoCLOPramide (REGLAN) injection  5 mg Intravenous Q6H   multivitamin with minerals  1 tablet Oral Daily   pregabalin  150 mg Oral BID   Ensure Max Protein  11 oz Oral BID BM   Continuous Infusions:  famotidine (PEPCID) IV Stopped (11/16/19 2238)   vancomycin Stopped (11/16/19 1838)    Principal Problem:   Intractable nausea and vomiting Active Problems:   Essential hypertension   CKD (chronic kidney disease), stage III (HCC)   Septic arthritis of elbow, right (HCC)   ARF (acute renal failure) (HCC)   Hepatic cirrhosis (HCC)   CAD (coronary artery disease)   Diabetic peripheral neuropathy (HCC)   Back pain   Compression fracture of L1 lumbar vertebra (HCC)   Fracture of multiple ribs   Sleep apnea    Time spent: 75 minutes    Radene Gunning NP  [facility]  If 7PM-7AM, please contact night-coverage at [email or web address], password [facility] 11/17/2019, 10:53 AM  LOS: 1 day

## 2019-11-18 ENCOUNTER — Inpatient Hospital Stay (HOSPITAL_COMMUNITY): Payer: PRIVATE HEALTH INSURANCE

## 2019-11-18 HISTORY — PX: IR REMOVAL TUN CV CATH W/O FL: IMG2289

## 2019-11-18 LAB — CBC
HCT: 35.4 % — ABNORMAL LOW (ref 36.0–46.0)
Hemoglobin: 11.6 g/dL — ABNORMAL LOW (ref 12.0–15.0)
MCH: 27.6 pg (ref 26.0–34.0)
MCHC: 32.8 g/dL (ref 30.0–36.0)
MCV: 84.1 fL (ref 80.0–100.0)
Platelets: 139 10*3/uL — ABNORMAL LOW (ref 150–400)
RBC: 4.21 MIL/uL (ref 3.87–5.11)
RDW: 14.6 % (ref 11.5–15.5)
WBC: 8 10*3/uL (ref 4.0–10.5)
nRBC: 0 % (ref 0.0–0.2)

## 2019-11-18 LAB — GLUCOSE, CAPILLARY
Glucose-Capillary: 83 mg/dL (ref 70–99)
Glucose-Capillary: 148 mg/dL — ABNORMAL HIGH (ref 70–99)
Glucose-Capillary: 166 mg/dL — ABNORMAL HIGH (ref 70–99)
Glucose-Capillary: 175 mg/dL — ABNORMAL HIGH (ref 70–99)

## 2019-11-18 MED ORDER — METOCLOPRAMIDE HCL 5 MG/ML IJ SOLN
5.0000 mg | Freq: Four times a day (QID) | INTRAMUSCULAR | Status: DC | PRN
  Administered 2019-11-18: 5 mg via INTRAVENOUS
  Filled 2019-11-18: qty 2

## 2019-11-18 MED ORDER — DOXYCYCLINE HYCLATE 50 MG PO CAPS: 100.0000 mg | ORAL_CAPSULE | Freq: Two times a day (BID) | ORAL | 0 refills | Status: AC

## 2019-11-18 MED ORDER — ASPIRIN 81 MG PO TBEC: 81.0000 mg | DELAYED_RELEASE_TABLET | Freq: Two times a day (BID) | ORAL | 0 refills | Status: AC

## 2019-11-18 NOTE — Progress Notes (Signed)
Pt seen by IV team need consult IR to d/c tunnel PICC.

## 2019-11-18 NOTE — Plan of Care (Signed)
Pt for discharge today going home, discontinued peripheral IV line, alert and oriented, ambulates, tolerate her food, no s/s of nausea/vomiting, given health teachings, next appointment, due med explained and understood, also given personal belongings, waiting for her husband to pick her up.

## 2019-11-18 NOTE — Discharge Summary (Signed)
Discharge Summary  [name] [name] EZM:[id number] DOB: 07-01-1956  PCP: Ann Held, DO  Admit date: 11/15/2019 Discharge date: 11/18/2019   Recommendations for Outpatient Follow-up:  1. PCP 2 weeks  2. ID Dr. Megan Salon as arranged.  Discharge Diagnoses:  Active Hospital Problems   Diagnosis Date Noted  . Intractable nausea and vomiting 11/16/2019  . Back pain 11/17/2019  . Compression fracture of L1 lumbar vertebra ([place]) 11/17/2019  . Fracture of multiple ribs 11/17/2019  . ARF (acute renal failure) ([place]) 11/15/2019  . Septic arthritis of elbow, right ([place]) 04/17/2019  . Sleep apnea 11/16/2017  . Diabetic peripheral neuropathy ([place]) 01/12/2015  . CKD (chronic kidney disease), stage III ([place]) 05/27/2014  . CAD (coronary artery disease) 01/11/2012  . Hepatic cirrhosis ([place]) 07/06/2010  . Essential hypertension 12/23/2006    Resolved Hospital Problems  No resolved problems to display.    Discharge Condition: Stable   Diet recommendation: Full liquid diabetic, to be advanced as tolerated by nausea.   Vitals:   11/17/19 1939 11/18/19 0426  BP: (!) 165/74 (!) 153/68  Pulse: 82 81  Resp: 14 14  Temp: 98 F (36.7 C) 98.2 F (36.8 C)  SpO2: 92% 90%    HPI and Brief Hospital Course:  [name] [name] is an 64 y.o. 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 She was treated with scheduled Reglan and doing much better now. Was seen by ID due to her back pain and chronic R elbow infection they recommend PO Doxy at DC and no need for further back imaging or IV abx. She will FU with ID as outpatient.  1.  Intractable nausea and vomiting.  May be related to diabetic gastroparesis.  Provided with scheduled Reglan.  Tolerating full liquids without issue.  #2.  Acute kidney injury  superimposed on chronic kidney disease stage III.  History of renal transplant but CT scan not reflective and not on any medications. Creatinine stable at what appears to be baseline  #3.  Back pain/acute compression fracture of L1/right-sided rib fractures dated to a recent fall.  CT reveals slight interval worsening of the L1 fracture.  She clearly has good reason for back pain but given her history septic joint and bacteremia some concern for infectious process.  Currently is undergoing outpatient therapy for septic joint of the right elbow with vancomycin.  Was seen by ID with no recommendation for further workup.  #4.  Septic arthritis of the right elbow.  ID recommend change to PO doxycycline at DC and will FU with ID as outpatient for further recommendations.  #5.  Hypertension.  Blood pressure remains on the high end of normal.  Home meds include HCTZ which has been on hold.   6.  Hyperlipidemia -Continue home Lipitor  #7. Anemia. Hb has been stable at 11.  #8.  Anxiety/PTSD.  Home medications include Cymbalta.  Appears stable at baseline  #9.  Microscopic hematuria. Outpatient follow-up  #10. Diabetes. humalog TID. Uncontrolled. A1c greater than 12 6 months ago.   Discharge details, plan of care and follow up instructions were discussed with patient and any available family or care providers. Patient and family are in agreement with discharge from the hospital today and all questions were answered to their satisfaction.  Procedures:  None   Consultations:  ID   Discharge Exam: BP [name] Kitchen)  153/68 (BP Location: Left Arm)   Pulse 81   Temp 98.2 F (36.8 C) (Oral)   Resp 14   Ht 5' 1"  (1.549 m)   Wt 84 kg   SpO2 90%   BMI 34.99 kg/m  General:  Alert, oriented, calm, in no acute distress  Eyes: EOMI, clear sclerea Neck: supple, no masses, trachea mildline  Cardiovascular: RRR, no murmurs or rubs, no peripheral edema  Respiratory: clear to auscultation bilaterally,  no wheezes, no crackles  Abdomen: soft, nontender, nondistended, normal bowel tones heard  Skin: dry, no rashes  Musculoskeletal: no joint effusions, normal range of motion  Psychiatric: appropriate affect, normal speech  Neurologic: extraocular muscles intact, clear speech, moving all extremities with intact sensorium    Discharge Instructions You were cared for by a hospitalist during your hospital stay. If you have any questions about your discharge medications or the care you received while you were in the hospital after you are discharged, you can call the unit and asked to speak with the hospitalist on call if the hospitalist that took care of you is not available. Once you are discharged, your primary care physician will handle any further medical issues. Please note that NO REFILLS for any discharge medications will be authorized once you are discharged, as it is imperative that you return to your primary care physician (or establish a relationship with a primary care physician if you do not have one) for your aftercare needs so that they can reassess your need for medications and monitor your lab values.   Allergies as of 11/18/2019      Reactions   Bee Pollen Anaphylaxis   Fish-derived Products Hives, Shortness Of Breath, Swelling, Rash   Hives get in throat causing trouble breathing   Mushroom Extract Complex Anaphylaxis   Penicillins Anaphylaxis   Did it involve swelling of the face/tongue/throat, SOB, or low BP? Yes Did it involve sudden or severe rash/hives, skin peeling, or any reaction on the inside of your mouth or nose? No Did you need to seek medical attention at a hospital or doctor's office? Yes When did it last happen?A few months ago If all above answers are "NO", may proceed with cephalosporin use.   Rosemary Oil Anaphylaxis   Shellfish Allergy Hives, Shortness Of Breath, Swelling, Rash   Tomato Hives, Shortness Of Breath   Hives in throat causes her trouble  breathing   Acetaminophen Other (See Comments)   GI upset   Acyclovir And Related Other (See Comments)   Unknown rxn   Aloe Vera Hives   Broccoli [brassica Oleracea] Hives   Naproxen Other (See Comments)   Unknown rxn      Medication List    STOP taking these medications   fluconazole 150 MG tablet Commonly known as: DIFLUCAN   vancomycin 1-5 GM/200ML-% Soln Commonly known as: VANCOCIN     TAKE these medications   aspirin 81 MG EC tablet Take 1 tablet (81 mg total) by mouth 2 (two) times daily.   atorvastatin 10 MG tablet Commonly known as: LIPITOR Take 1 tablet by mouth once daily   CALCIUM ALGINATE EX Apply 1 patch topically every other day.   docusate sodium 100 MG capsule Commonly known as: COLACE Take 100 mg by mouth daily.   doxycycline 50 MG capsule Commonly known as: VIBRAMYCIN Take 2 capsules (100 mg total) by mouth 2 (two) times daily.   DULoxetine 60 MG capsule Commonly known as: CYMBALTA Take 1 capsule by mouth once daily  Ensure Max Protein Liqd Take 330 mLs (11 oz total) by mouth 2 (two) times daily.   EPINEPHrine 0.3 mg/0.3 mL Soaj injection Commonly known as: EPI-PEN Inject into the muscle.   fluticasone 50 MCG/ACT nasal spray Commonly known as: FLONASE Place 2 sprays into both nostrils daily. What changed:   when to take this  reasons to take this   HYDROcodone-acetaminophen 5-325 MG tablet Commonly known as: NORCO/VICODIN Take 1-2 tablets by mouth every 6 (six) hours as needed for moderate pain or severe pain.   Insulin Lispro 200 UNIT/ML Sopn Commonly known as: HumaLOG KwikPen Inject 13 Units into the skin daily. What changed:   when to take this  additional instructions   Iron 325 (65 Fe) MG Tabs Take 1 tablet by mouth daily.   Lantus SoloStar 100 UNIT/ML Solostar Pen Generic drug: Insulin Glargine INJECT 20 UNITS SUBCUTANEOUSLY ONCE DAILY IN THE MORNING AND THEN INJECT 40 UNITS AT BEDTIME What changed:   how much  to take  how to take this  when to take this   Multivitamin Gummies [employer] 2 each by mouth daily.   Naftifine HCl 2 % Crea Apply qd for up to 4 weeks   ondansetron 4 MG disintegrating tablet Commonly known as: Zofran ODT Take 1 tablet (4 mg total) by mouth every 8 (eight) hours as needed for nausea or vomiting.   ondansetron 4 MG disintegrating tablet Commonly known as: Zofran ODT Take 1 tablet (4 mg total) by mouth every 4 (four) hours as needed for nausea or vomiting.   polyethylene glycol 17 g packet Commonly known as: MIRALAX / GLYCOLAX Take 17 g by mouth daily as needed for mild constipation.   pregabalin 150 MG capsule Commonly known as: LYRICA Take 1 capsule (150 mg total) by mouth 2 (two) times daily.   promethazine 25 MG suppository Commonly known as: Phenergan Place 1 suppository (25 mg total) rectally every 6 (six) hours as needed for nausea or vomiting.      Allergies  Allergen Reactions  . Bee Pollen Anaphylaxis  . Fish-Derived Products Hives, Shortness Of Breath, Swelling and Rash    Hives get in throat causing trouble breathing  . Mushroom Extract Complex Anaphylaxis  . Penicillins Anaphylaxis    Did it involve swelling of the face/tongue/throat, SOB, or low BP? Yes Did it involve sudden or severe rash/hives, skin peeling, or any reaction on the inside of your mouth or nose? No Did you need to seek medical attention at a hospital or doctor's office? Yes When did it last happen?A few months ago If all above answers are "NO", may proceed with cephalosporin use.  [name] Kitchen Rosemary Oil Anaphylaxis  . Shellfish Allergy Hives, Shortness Of Breath, Swelling and Rash  . Tomato Hives and Shortness Of Breath    Hives in throat causes her trouble breathing  . Acetaminophen Other (See Comments)    GI upset  . Acyclovir And Related Other (See Comments)    Unknown rxn  . Aloe Vera Hives  . Broccoli [Brassica Oleracea] Hives  . Naproxen Other (See  Comments)    Unknown rxn      The results of significant diagnostics from this hospitalization (including imaging, microbiology, ancillary and laboratory) are listed below for reference.    Significant Diagnostic Studies: CT ABDOMEN PELVIS WO CONTRAST  Result Date: 11/15/2019 CLINICAL DATA:  Nausea and vomiting. Right rib pain EXAM: CT ABDOMEN AND PELVIS WITHOUT CONTRAST TECHNIQUE: Multidetector CT imaging of the abdomen and pelvis was  performed following the standard protocol without IV contrast. COMPARISON:  November 11, 2019 FINDINGS: Lower chest: The lung bases are clear. The heart size is normal. Hepatobiliary: Again noted is a cirrhotic appearance of the liver with multiple small calcifications likely related to a prior granulomatous infection. Status post cholecystectomy.There is no biliary ductal dilation. Pancreas: Normal contours without ductal dilatation. No peripancreatic fluid collection. Spleen: The spleen is enlarged. Adrenals/Urinary Tract: --Adrenal glands: No adrenal hemorrhage. --Right kidney/ureter: No hydronephrosis or perinephric hematoma. --Left kidney/ureter: No hydronephrosis or perinephric hematoma. --Urinary bladder: The urinary bladder is moderately distended. Stomach/Bowel: --Stomach/Duodenum: No hiatal hernia or other gastric abnormality. Normal duodenal course and caliber. --Small bowel: No dilatation or inflammation. --Colon: No focal abnormality. --Appendix: Normal. Vascular/Lymphatic: Atherosclerotic calcification is present within the non-aneurysmal abdominal aorta, without hemodynamically significant stenosis. --No retroperitoneal lymphadenopathy. --No mesenteric lymphadenopathy. --again noted is mild pelvic adenopathy, similar to prior study. Reproductive: Status post hysterectomy. No adnexal mass. Other: No ascites or free air. The abdominal wall is normal. Musculoskeletal. Again noted is a fracture of the L1 vertebral body. This has slightly advanced and now involves  the inferior endplate and posterior wall with minimal retropulsion. There is no new acute fracture. IMPRESSION: 1. Again noted is an acute compression fracture of the L1 vertebral body. There has been slight interval worsening of this fracture, which now involves the inferior endplate and extends into the posterior wall. There is minimal retropulsion at this time. 2. Cirrhosis with stigmata of portal hypertension. 3. Additional chronic findings as detailed above. Aortic Atherosclerosis (ICD10-I70.0). Electronically Signed   By: Constance Holster M.D.   On: 11/15/2019 19:08   CT Abdomen Pelvis Wo Contrast  Result Date: 11/11/2019 CLINICAL DATA:  Fall 3 days prior with severe right flank and low back pain. EXAM: CT ABDOMEN AND PELVIS WITHOUT CONTRAST TECHNIQUE: Multidetector CT imaging of the abdomen and pelvis was performed following the standard protocol without IV contrast. COMPARISON:  01/12/2017 CT abdomen/pelvis. FINDINGS: Lower chest: No significant pulmonary nodules or acute consolidative airspace disease. Coronary atherosclerosis. Hepatobiliary: Diffusely irregular liver surface with heterogeneous liver parenchyma, compatible with cirrhosis. Diffuse hepatic steatosis. Granulomatous calcifications scattered throughout the liver. No discrete liver mass on this noncontrast scan. Cholecystectomy. Bile ducts are stable and within normal post cholecystectomy limits with CBD diameter 6 mm. Pancreas: Normal, with no mass or duct dilation. Spleen: Granulomatous calcifications scattered in the spleen. Mild splenomegaly (craniocaudal splenic length 13.1 cm), slightly increased. No splenic mass. Adrenals/Urinary Tract: Normal adrenals. No renal stones. No hydronephrosis. No contour deforming renal masses. Normal bladder. Stomach/Bowel: Normal non-distended stomach. Normal caliber small bowel with no small bowel wall thickening. Normal appendix. Normal large bowel with no diverticulosis, large bowel wall thickening  or pericolonic fat stranding. Vascular/Lymphatic: Atherosclerotic nonaneurysmal abdominal aorta. Mild porta hepatis adenopathy up to 1.4 cm (series 2/image 25), stable. Mildly enlarged 1.4 cm left inguinal node (series 2/image 101), not previously imaged. No additional pathologically enlarged abdominopelvic nodes. Reproductive: Status post hysterectomy, with no abnormal findings at the vaginal cuff. No adnexal mass. Other: No pneumoperitoneum, ascites or focal fluid collection. Musculoskeletal: No aggressive appearing focal osseous lesions. Acute mild superior L1 vertebral compression fracture with approximately 10-20% loss of vertebral body height anteriorly. No significant retropulsion. No additional fractures. Moderate thoracolumbar spondylosis. IMPRESSION: 1. Acute mild superior L1 vertebral compression fracture. 2. No additional acute traumatic injuries in the abdomen or pelvis. 3. Cirrhosis.  Mild splenomegaly.  No ascites. 4. Mild porta hepatis adenopathy is stable and probably reactive. 5. Mild  left inguinal lymphadenopathy, not previously imaged, nonspecific. If there are clinical or laboratory findings suspicious for a lymphoproliferative disorder, percutaneous ultrasound-guided biopsy could be obtained. Otherwise follow-up CT abdomen/pelvis with oral and IV contrast recommended in 3 months. This recommendation follows ACR consensus guidelines: White Paper of the ACR Incidental Findings Committee II on Splenic and Nodal Findings. J Am Coll Radiol 2013;10:789-794. 6.  Aortic Atherosclerosis (ICD10-I70.0). Electronically Signed   By: Ilona Sorrel M.D.   On: 11/11/2019 14:11   DG Ribs Unilateral W/Chest Right  Result Date: 11/12/2019 CLINICAL DATA:  63 year old female with recent fall and right posterior rib pain. Right rib fractures. EXAM: RIGHT RIBS AND CHEST - 3+ VIEW COMPARISON:  Chest radiograph dated 11/11/2019 FINDINGS: Right IJ central venous line with tip over central SVC in similar position. The  lungs are clear. There is no pleural effusion or pneumothorax. Stable cardiac silhouette. Coronary vascular calcification. The previously described right-sided rib fractures are better seen on the prior radiograph. IMPRESSION: 1. No acute cardiopulmonary process.  No pneumothorax. 2. Right-sided rib fractures more conspicuous on prior radiograph. Electronically Signed   By: Anner Crete M.D.   On: 11/12/2019 20:26   DG Ribs Unilateral W/Chest Right  Result Date: 11/11/2019 CLINICAL DATA:  Right-sided rib pain after fall 3 days ago EXAM: RIGHT RIBS AND CHEST - 3+ VIEW COMPARISON:  04/20/2019 FINDINGS: Nondisplaced fractures of the posterolateral aspects of the right eighth and ninth ribs. There is no evidence of pneumothorax or pleural effusion. Right-sided tunneled PICC with distal tip terminating in the SVC. Both lungs are clear. Heart size and mediastinal contours are within normal limits. Severe degenerative changes of the left shoulder. Chronic left clavicular fracture deformity. IMPRESSION: 1. Nondisplaced fractures of the posterolateral aspects of the right eighth and ninth ribs. 2. No evidence of pneumothorax. Electronically Signed   By: Davina Poke D.O.   On: 11/11/2019 16:48   CT Head Wo Contrast  Result Date: 11/12/2019 CLINICAL DATA:  Nausea, vomiting, back and abdominal pain EXAM: CT HEAD WITHOUT CONTRAST TECHNIQUE: Contiguous axial images were obtained from the base of the skull through the vertex without intravenous contrast. COMPARISON:  CT head 04/06/2017 FINDINGS: Brain: Enlarged supracerebellar CSF space compatible with a stable arachnoid cyst. No evidence of acute infarction, hemorrhage, hydrocephalus, extra-axial collection or mass lesion/mass effect. Vascular: Atherosclerotic calcification of the carotid siphons. No hyperdense vessel. Skull: No calvarial fracture or suspicious osseous lesion. No scalp swelling or hematoma. Sinuses/Orbits: Paranasal sinuses and mastoid air cells  are predominantly clear. Prior left lens extraction. Orbits are otherwise unremarkable. Other: None IMPRESSION: 1. No acute intracranial abnormality. 2. Stable supracerebellar arachnoid cyst. Electronically Signed   By: Lovena Le M.D.   On: 11/12/2019 20:21   IR Fluoro Guide CV Line Right  Result Date: 11/04/2019 INDICATION: 64 year old female with a history of septic arthritis and prior tunneled central venous catheter malfunction EXAM: TUNNELED PICC LINE WITH  FLUOROSCOPIC GUIDANCE MEDICATIONS: None ANESTHESIA/SEDATION: None FLUOROSCOPY TIME:  Fluoroscopy Time: 0 minutes 30 seconds COMPLICATIONS: None PROCEDURE: Informed written consent was obtained from the patient after a discussion of the risks, benefits, and alternatives to treatment. Questions regarding the procedure were encouraged and answered. The right neck and chest including the indwelling catheter were prepped with chlorhexidine in a sterile fashion, and a sterile drape was applied covering the operative field. Maximum barrier sterile technique with sterile gowns and gloves were used for the procedure. A timeout was performed prior to the initiation of the procedure. Spot image was  acquired. 1% lidocaine was used for local anesthesia at the indwelling catheter site. The indwelling catheter was withdrawn over a Nitrex wire. The catheter was ligated a 20 cm and then passed over the wire, with the tip terminating at the superior cavoatrial junction/upper right atrium. Wire was removed and aspiration was attempted. The catheter remain nonfunctional. A presumed fibrin sheath or narrowing is present at the site of the prior catheter. The catheter was then again withdrawn on the Nitrex wire. Catheter was ligated at 17 cm and then placed again over the Nitrex wire. Aspiration was successful. Final image was stored. Catheter was sutured in position. Patient tolerated the procedure well and remained hemodynamically stable throughout. No complications were  encountered and no significant blood loss. FINDINGS: After catheter placement, the tip lies within the SVC. The catheter aspirates and flushes normally and is ready for immediate use. IMPRESSION: Status post replacement of nonfunctional right IJ tunneled cuffed single-lumen catheter, now measuring 17 cm. Signed, Dulcy Fanny. Dellia Nims, RPVI [facility] Electronically Signed   By: Corrie Mckusick D.O.   On: 11/04/2019 13:35    Microbiology: Recent Results (from the past 240 hour(s))  Urine culture     Status: Abnormal   Collection Time: 11/11/19  4:55 PM   Specimen: Urine, Random  Result Value Ref Range Status   Specimen Description   Final    URINE, RANDOM Performed at [facility], [address] [place], [place] [address]    Special Requests   Final    NONE Performed at [facility], [address] [place], [place] [address]    Culture 20,000 COLONIES/mL KLEBSIELLA PNEUMONIAE (A)  Final   Report Status 11/13/2019 FINAL  Final   Organism ID, Bacteria KLEBSIELLA PNEUMONIAE (A)  Final      Susceptibility   Klebsiella pneumoniae - MIC*    AMPICILLIN >=32 RESISTANT Resistant     CEFAZOLIN <=4 SENSITIVE Sensitive     CEFTRIAXONE <=0.25 SENSITIVE Sensitive     CIPROFLOXACIN <=0.25 SENSITIVE Sensitive     GENTAMICIN <=1 SENSITIVE Sensitive     IMIPENEM <=0.25 SENSITIVE Sensitive     NITROFURANTOIN 128 RESISTANT Resistant     TRIMETH/SULFA <=20 SENSITIVE Sensitive     AMPICILLIN/SULBACTAM 8 SENSITIVE Sensitive     PIP/TAZO <=4 SENSITIVE Sensitive     * 20,000 COLONIES/mL KLEBSIELLA PNEUMONIAE  SARS CORONAVIRUS 2 (TAT 6-24 HRS) Nasopharyngeal Nasopharyngeal Swab     Status: None   Collection Time: 11/15/19  9:53 PM   Specimen: Nasopharyngeal Swab  Result Value Ref Range Status   SARS Coronavirus 2 NEGATIVE NEGATIVE Final    Comment: (NOTE) SARS-CoV-2 target nucleic acids are NOT DETECTED. The SARS-CoV-2 RNA  is generally detectable in upper and lower respiratory specimens during the acute phase of infection. Negative results do not preclude SARS-CoV-2 infection, do not rule out co-infections with other pathogens, and should not be used as the sole basis for treatment or other patient management decisions. Negative results must be combined with clinical observations, patient history, and epidemiological information. The expected result is Negative. Fact Sheet for Patients: SugarRoll.be Fact Sheet for Healthcare Providers: https://www.woods-mathews.com/ This test is not yet approved or cleared by the [place] FDA and  has been authorized for detection and/or diagnosis of SARS-CoV-2 by FDA under an Emergency Use Authorization (EUA). This EUA will remain  in effect (meaning this test can be used) for the duration of the COVID-19 declaration under  Section 56 4(b)(1) of the Act, 21 U.S.C. section 360bbb-3(b)(1), unless the authorization is terminated or revoked sooner. Performed at [facility], [place] [address], [place], [place] [address]      Labs: Basic Metabolic Panel: Recent Labs  Lab 11/11/19 1131 11/11/19 1411 11/12/19 1933 11/15/19 1439 11/16/19 0426  NA 133* 133* 134* 133* 137  K 4.0 3.6 3.7 3.8 3.5  CL 97 98 100 98 101  CO2 27 22 21* 25 28  GLUCOSE 409* 423* 462* 317* 226*  BUN 20 22 22  31* 31*  CREATININE 1.59* 1.68* 1.65* 1.99* 1.84*  CALCIUM 9.5 8.9 9.2 8.6* 8.1*   Liver Function Tests: Recent Labs  Lab 11/11/19 1131 11/11/19 1411 11/12/19 1933 11/15/19 1439 11/16/19 0426  AST 19 22 20  33 26  ALT 16 20 19 23 20   ALKPHOS 125* 118 111 120 96  BILITOT 0.8 1.2 1.3* 1.4* 1.2  PROT 8.2 8.2* 8.1 7.3 6.0*  ALBUMIN 3.7 3.5 3.5 2.9* 2.3*   Recent Labs  Lab 11/12/19 1933 11/15/19 1439  LIPASE 33 26   No results for input(s): AMMONIA in the last 168 hours. CBC: Recent Labs  Lab 11/11/19 1131 11/11/19 1131  11/11/19 1411 11/12/19 1933 11/15/19 1439 11/16/19 0426 11/18/19 0543  WBC 7.1   < > 6.7 6.8 13.5* 7.6 8.0  NEUTROABS 5.5  --  5.3 5.7  --   --   --   HGB 12.6   < > 12.6 12.5 13.6 11.2* 11.6*  HCT 38.3   < > 38.8 37.9 41.1 35.6* 35.4*  MCV 84.0   < > 83.8 84.0 83.0 86.6 84.1  PLT 135.0*   < > 124* 146* 175 123* 139*   < > = values in this interval not displayed.   Cardiac Enzymes: No results for input(s): CKTOTAL, CKMB, CKMBINDEX, TROPONINI in the last 168 hours. BNP: BNP (last 3 results) No results for input(s): BNP in the last 8760 hours.  ProBNP (last 3 results) No results for input(s): PROBNP in the last 8760 hours.  CBG: Recent Labs  Lab 11/17/19 1635 11/17/19 1942 11/17/19 2344 11/18/19 0430 11/18/19 0817  GLUCAP 155* 119* 115* 83 148*    Time spent: 35 minutes were spent in preparing this discharge including medication reconciliation, counseling, and coordination of care.  Signed:  Bunny Kleist Marry Guan, MD  [facility] 11/18/2019, 10:03 AM

## 2019-11-18 NOTE — Progress Notes (Signed)
Discharged home with husband driving patient home. Discharged instructions,personal belongings given. Verbalized understanding of instructions

## 2019-11-18 NOTE — Procedures (Signed)
Pre procedural Dx: Poor venous access Post procedural Dx: Same  Successful removal of tunneled right IJ central venous catheter  EBL: None No immediate complications.  Hemostasis achieved with manual pressure. 4 x 4 gauze + tegaderm applied - dressing to remain x 24H, then routine wound care until healed. May shower after 24H. Do not submerge until healed.   Please see imaging section of Epic for full dictation.  Joaquim Nam PA-C 11/18/2019 1:29 PM

## 2019-11-18 NOTE — Progress Notes (Signed)
Pt waiting for the IV team to discontinue PICC line.

## 2019-11-19 ENCOUNTER — Telehealth: Payer: Self-pay | Admitting: *Deleted

## 2019-11-19 ENCOUNTER — Telehealth: Payer: Self-pay | Admitting: Family Medicine

## 2019-11-19 NOTE — Telephone Encounter (Signed)
Unable to reach pt at this time. Pt has hospital follow up scheduled w/ PCP tomorrow (11/20/19 @3pm ).

## 2019-11-19 NOTE — Telephone Encounter (Signed)
Patient broke her back.  They gave her some hydrocodone and she is in really bad pain.  Will you be able to refill?  Do you want to see her?

## 2019-11-19 NOTE — Telephone Encounter (Signed)
She was seen in the ER she says

## 2019-11-19 NOTE — Telephone Encounter (Signed)
Caller Name: PT Phone: [phone] Pharmacy:  [id number] - [place], [place] - [address] AT [place] Phone:  [phone]  Fax:  [phone]     Pt stated she was only given enough medication until 1/21 from the hospital doctor. She is in a lot of pain from the broken back. (see ER notes). She tried calling sooner but could not get thru and has been having a very hard time.

## 2019-11-19 NOTE — Telephone Encounter (Signed)
Who is she seeing for her back?  They should be able to esp if she needs stronger.

## 2019-11-20 ENCOUNTER — Other Ambulatory Visit: Payer: Self-pay | Admitting: Family Medicine

## 2019-11-20 ENCOUNTER — Ambulatory Visit: Payer: PRIVATE HEALTH INSURANCE | Admitting: Family Medicine

## 2019-11-20 DIAGNOSIS — S32010A Wedge compression fracture of first lumbar vertebra, initial encounter for closed fracture: Secondary | ICD-10-CM

## 2019-11-20 MED ORDER — HYDROCODONE-ACETAMINOPHEN 5-325 MG PO TABS: 1.0000 | ORAL_TABLET | Freq: Four times a day (QID) | ORAL | 0 refills | Status: DC | PRN

## 2019-11-20 NOTE — Telephone Encounter (Signed)
Left message on machine that medication was sent in and to call back let us know does she have a follow up with an orthopedic for her back.

## 2019-11-20 NOTE — Telephone Encounter (Signed)
I sent in a refill---- does she have a f/u with an ortho about her back?

## 2019-11-24 ENCOUNTER — Telehealth: Payer: Self-pay | Admitting: Family Medicine

## 2019-11-24 ENCOUNTER — Other Ambulatory Visit: Payer: Self-pay | Admitting: Family Medicine

## 2019-11-24 DIAGNOSIS — S32010A Wedge compression fracture of first lumbar vertebra, initial encounter for closed fracture: Secondary | ICD-10-CM

## 2019-11-24 MED ORDER — HYDROCODONE-ACETAMINOPHEN 5-325 MG PO TABS: 1.0000 | ORAL_TABLET | Freq: Four times a day (QID) | ORAL | 0 refills | Status: DC | PRN

## 2019-11-24 NOTE — Telephone Encounter (Signed)
done

## 2019-11-24 NOTE — Telephone Encounter (Signed)
HYDROcodone-acetaminophen (NORCO/VICODIN) 5-325 MG tablet  Patient states that she called in last week got Rx only for 20 pills but the prescriptions should have been  #120 pills per ongoing prescription with Dr. Etter Sjogren.  Also this prescription has been sent to the wrong Pharmacy per patient. Per patient Rx needs to go to [facility] off [address]

## 2019-11-24 NOTE — Telephone Encounter (Signed)
Fyi.  Patient husband has called again about meds. I informed him that Dr. Etter Sjogren was seeing patients and once she's able to respond she will respond.

## 2019-11-24 NOTE — Telephone Encounter (Signed)
Please advise 

## 2019-11-25 ENCOUNTER — Ambulatory Visit (INDEPENDENT_AMBULATORY_CARE_PROVIDER_SITE_OTHER): Payer: PRIVATE HEALTH INSURANCE | Admitting: Family Medicine

## 2019-11-25 ENCOUNTER — Other Ambulatory Visit: Payer: Self-pay

## 2019-11-25 ENCOUNTER — Encounter: Payer: Self-pay | Admitting: Family Medicine

## 2019-11-25 DIAGNOSIS — M545 Low back pain, unspecified: Secondary | ICD-10-CM

## 2019-11-25 DIAGNOSIS — M009 Pyogenic arthritis, unspecified: Secondary | ICD-10-CM | POA: Diagnosis not present

## 2019-11-25 DIAGNOSIS — R102 Pelvic and perineal pain: Secondary | ICD-10-CM

## 2019-11-25 DIAGNOSIS — G8929 Other chronic pain: Secondary | ICD-10-CM | POA: Diagnosis not present

## 2019-11-25 MED ORDER — CIPROFLOXACIN HCL 250 MG PO TABS: 250.0000 mg | ORAL_TABLET | Freq: Two times a day (BID) | ORAL | 0 refills | Status: DC

## 2019-11-25 MED ORDER — LIDOCAINE 5 % EX PTCH: 1.0000 | MEDICATED_PATCH | CUTANEOUS | 0 refills | Status: DC

## 2019-11-25 NOTE — Progress Notes (Signed)
Virtual Visit via Video Note  I connected with [name] [name] on 11/25/19 at  3:40 PM EST by a video enabled telemedicine application and verified that I am speaking with the correct person using two identifiers.  Location: Patient: home with her husband Provider: office    I discussed the limitations of evaluation and management by telemedicine and the availability of in person appointments. The patient expressed understanding and agreed to proceed.  History of Present Illness: Pt is home with her husband c/o con't suprapubic pain, nausea and weakness. She is not eating or drinking much at all.  Pt states this is how she felt the day she was d/c but she was d/c anyway.  Pt and her husband were very upset over her hospital stay.   Pt was d/c with doxycycline but she has not been able to hold it down and it has made her very sick-- they have not called ID about this.  They stopped it days ago and pt symptoms are progressing    Pt now c/o burning with urination and frequency as well  They will call ID as well to discuss not being able to take the doxy.     Past Medical History:  Diagnosis Date  . Acute MI [facility]) 2007   presented to ED & had cardiac cath- but found to have normal coronaries. Since that point in time her PCP cares f or cardiac needs. Dr. [name] - [facility]  . Anemia   . Anginal pain ([place])   . Anxiety   . Asthma   . Bulging lumbar disc   . Cataract   . 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 Cirrhosis of liver without mention of alcohol   . Constipation   . Dehiscence of closure of skin    left partial calcaneal excision  . Depression   . Diabetes mellitus    insulin dependent, adult onset  . Episode of visual loss of left eye   . Exertional shortness of breath   . Fatty liver   . Fibromyalgia   . GERD (gastroesophageal reflux disease)   . Hepatic steatosis   . High cholesterol   . Hypertension   .  MRSA (methicillin resistant Staphylococcus aureus)   . Neuropathy    lower legs  . Osteoarthritis    hands, hips  . Proximal humerus fracture 10/15/12   Left  . PTSD (post-traumatic stress disorder)   . Renal insufficiency 05/05/2015   Current Outpatient Medications on File Prior to Visit  Medication Sig Dispense Refill  . aspirin EC 81 MG EC tablet Take 1 tablet (81 mg total) by mouth 2 (two) times daily. 60 tablet 0  . atorvastatin (LIPITOR) 10 MG tablet Take 1 tablet by mouth once daily 90 tablet 1  . CALCIUM ALGINATE EX Apply 1 patch topically every other day.    . docusate sodium (COLACE) 100 MG capsule Take 100 mg by mouth daily.    [name] Kitchen doxycycline (VIBRAMYCIN) 50 MG capsule Take 2 capsules (100 mg total) by mouth 2 (two) times daily. 120 capsule 0  . DULoxetine (CYMBALTA) 60 MG capsule Take 1 capsule by mouth once daily (Patient taking differently: Take 60 mg by mouth daily. ) 90 capsule 1  . Ensure Max Protein (ENSURE MAX PROTEIN) LIQD Take 330 mLs (11 oz total) by mouth 2 (two) times daily. 3300 mL 0  . EPINEPHrine 0.3 mg/0.3 mL IJ SOAJ  injection Inject into the muscle.    . Ferrous Sulfate (IRON) 325 (65 Fe) MG TABS Take 1 tablet by mouth daily.     . fluticasone (FLONASE) 50 MCG/ACT nasal spray Place 2 sprays into both nostrils daily. (Patient taking differently: Place 2 sprays into both nostrils daily as needed for allergies. ) 16 g 6  . HYDROcodone-acetaminophen (NORCO/VICODIN) 5-325 MG tablet Take 1-2 tablets by mouth every 6 (six) hours as needed for moderate pain or severe pain. 120 tablet 0  . Insulin Glargine (LANTUS SOLOSTAR) 100 UNIT/ML Solostar Pen INJECT 20 UNITS SUBCUTANEOUSLY ONCE DAILY IN THE MORNING AND THEN INJECT 40 UNITS AT BEDTIME (Patient taking differently: Inject 20-40 Units into the skin 2 (two) times daily. INJECT 20 UNITS SUBCUTANEOUSLY ONCE DAILY IN THE MORNING AND THEN INJECT 40 UNITS AT BEDTIME) 45 mL 0  . Insulin Lispro (HUMALOG KWIKPEN) 200 UNIT/ML SOPN  Inject 13 Units into the skin daily. (Patient taking differently: Inject 13 Units into the skin 3 (three) times daily. Family states patient takes 13 units before every meal. Doesn't use a "sliding scale") 7 pen 3  . Multiple Vitamins-Minerals (MULTIVITAMIN GUMMIES WOMENS) CHEW Chew 2 each by mouth daily.    . Naftifine HCl 2 % CREA Apply qd for up to 4 weeks 60 g 2  . ondansetron (ZOFRAN ODT) 4 MG disintegrating tablet Take 1 tablet (4 mg total) by mouth every 8 (eight) hours as needed for nausea or vomiting. 20 tablet 0  . ondansetron (ZOFRAN ODT) 4 MG disintegrating tablet Take 1 tablet (4 mg total) by mouth every 4 (four) hours as needed for nausea or vomiting. 20 tablet 0  . polyethylene glycol (MIRALAX / GLYCOLAX) packet Take 17 g by mouth daily as needed for mild constipation.     . pregabalin (LYRICA) 150 MG capsule Take 1 capsule (150 mg total) by mouth 2 (two) times daily. 180 capsule 1  . promethazine (PHENERGAN) 25 MG suppository Place 1 suppository (25 mg total) rectally every 6 (six) hours as needed for nausea or vomiting. 12 each 0  . [DISCONTINUED] metFORMIN (GLUCOPHAGE) 1000 MG tablet Take 1,000 mg by mouth 2 (two) times daily with a meal.      . [DISCONTINUED] omeprazole (PRILOSEC) 20 MG capsule Take 20 mg by mouth daily.       No current facility-administered medications on file prior to visit.   Allergies  Allergen Reactions  . Bee Pollen Anaphylaxis  . Fish-Derived Products Hives, Shortness Of Breath, Swelling and Rash    Hives get in throat causing trouble breathing  . Mushroom Extract Complex Anaphylaxis  . Penicillins Anaphylaxis    Did it involve swelling of the face/tongue/throat, SOB, or low BP? Yes Did it involve sudden or severe rash/hives, skin peeling, or any reaction on the inside of your mouth or nose? No Did you need to seek medical attention at a hospital or doctor's office? Yes When did it last happen?A few months ago If all above answers are "NO", may  proceed with cephalosporin use.  [name] Kitchen Rosemary Oil Anaphylaxis  . Shellfish Allergy Hives, Shortness Of Breath, Swelling and Rash  . Tomato Hives and Shortness Of Breath    Hives in throat causes her trouble breathing  . Acetaminophen Other (See Comments)    GI upset  . Acyclovir And Related Other (See Comments)    Unknown rxn  . Aloe Vera Hives  . Broccoli [Brassica Oleracea] Hives  . Naproxen Other (See Comments)    Unknown rxn  Observations/Objective: There were no vitals filed for this visit.  Pt denies fever.  She is laying in bed and is very uncomfortable  Assessment and Plan: 1. Suprapubic pain tx empirically for uti---- ua and culture done at hosp Unable to repeat today Pt was so uncomfortable during visit -- I actually advised them to go back to ER but they refused  They did agree to go if cipro does not help or symtpoms worsen  - ciprofloxacin (CIPRO) 250 MG tablet; Take 1 tablet (250 mg total) by mouth 2 (two) times daily.  Dispense: 10 tablet; Refill: 0  2. Chronic low back pain, unspecified back pain laterality, unspecified whether sciatica present Pain med refilled  Add lidoderm patch - lidocaine (LIDODERM) 5 %; Place 1 patch onto the skin daily. Remove & Discard patch within 12 hours or as directed by MD  Dispense: 30 patch; Refill: 0  3. Pyogenic arthritis of right elbow, due to unspecified organism [facility]) Per id They will call tomorrow   Follow Up Instructions:    I discussed the assessment and treatment plan with the patient. The patient was provided an opportunity to ask questions and all were answered. The patient agreed with the plan and demonstrated an understanding of the instructions.   The patient was advised to call back or seek an in-person evaluation if the symptoms worsen or if the condition fails to improve as anticipated.  I provided 45 minutes of non-face-to-face time during this encounter. Discussed -- er visits, reviewed hosp records, labs  and discussed going back to ER--- pt and husband refused but would reconsider if symptoms worsen or do not improve Hey will call ID in am   Ann Held, DO

## 2019-11-26 NOTE — Assessment & Plan Note (Signed)
Pt unable to tol doxy They will f/u ID

## 2019-11-27 ENCOUNTER — Telehealth: Payer: Self-pay | Admitting: *Deleted

## 2019-11-27 ENCOUNTER — Telehealth: Payer: Self-pay | Admitting: Family Medicine

## 2019-11-27 DIAGNOSIS — S32010A Wedge compression fracture of first lumbar vertebra, initial encounter for closed fracture: Secondary | ICD-10-CM

## 2019-11-27 DIAGNOSIS — S2249XA Multiple fractures of ribs, unspecified side, initial encounter for closed fracture: Secondary | ICD-10-CM

## 2019-11-27 NOTE — Addendum Note (Signed)
Addended by: Kem Boroughs D on: 11/27/2019 03:54 PM   Modules accepted: Orders

## 2019-11-27 NOTE — Telephone Encounter (Signed)
Advised husband of note.  He agreed to referral to ortho.  Advised that if she is having trouble breathing then she will need to be seen now.  Advise to go to ER or urgent care.  He stated that she cannot move.  I advised him that she will have to be evaluated somehow and somewhere.

## 2019-11-27 NOTE — Telephone Encounter (Signed)
error 

## 2019-11-27 NOTE — Telephone Encounter (Signed)
Patient called and wanted oxygen for patient because she is having a hard time breathing.  Advised that this was not discussed at last visit on 11/25/19  He states that the patient is unable to move much because of her cracked ribs and fracture.  It is hard for her breath when she is lying down.  She has not seen an orthopedic.

## 2019-11-27 NOTE — Telephone Encounter (Signed)
We can not just order ox --- tests have to be done to see if she qualifies --- We can refer to ortho for her fracture If she is having trouble breathing we will need to be seen somewhere

## 2019-11-28 ENCOUNTER — Other Ambulatory Visit: Payer: Self-pay

## 2019-11-28 ENCOUNTER — Telehealth: Payer: Self-pay

## 2019-11-28 ENCOUNTER — Other Ambulatory Visit (INDEPENDENT_AMBULATORY_CARE_PROVIDER_SITE_OTHER): Payer: PRIVATE HEALTH INSURANCE

## 2019-11-28 ENCOUNTER — Other Ambulatory Visit: Payer: Self-pay | Admitting: Family Medicine

## 2019-11-28 DIAGNOSIS — R109 Unspecified abdominal pain: Secondary | ICD-10-CM

## 2019-11-28 NOTE — Telephone Encounter (Signed)
Received call from patient's husband. He just wanted to make Dr. Megan Salon aware of that patient was recently hospitalized and her IV antibiotics were changed to orals.  Patient has follow with ID. Eugenia Mcalpine

## 2019-11-28 NOTE — Addendum Note (Signed)
Addended by: Caffie Pinto on: 11/28/2019 02:42 PM   Modules accepted: Orders

## 2019-11-29 LAB — URINALYSIS, ROUTINE W REFLEX MICROSCOPIC
Bacteria, UA: NONE SEEN /HPF
Bilirubin Urine: NEGATIVE
Hyaline Cast: NONE SEEN /LPF
Ketones, ur: NEGATIVE
Nitrite: NEGATIVE
Specific Gravity, Urine: 1.016 (ref 1.001–1.03)
pH: 6.5 (ref 5.0–8.0)

## 2019-11-29 LAB — URINE CULTURE
MICRO NUMBER:: 10096378
SPECIMEN QUALITY:: ADEQUATE

## 2019-12-01 ENCOUNTER — Other Ambulatory Visit: Payer: Self-pay | Admitting: Family Medicine

## 2019-12-01 ENCOUNTER — Telehealth: Payer: Self-pay

## 2019-12-01 DIAGNOSIS — R102 Pelvic and perineal pain: Secondary | ICD-10-CM

## 2019-12-01 DIAGNOSIS — R109 Unspecified abdominal pain: Secondary | ICD-10-CM

## 2019-12-01 MED ORDER — CIPROFLOXACIN HCL 250 MG PO TABS: 250.0000 mg | ORAL_TABLET | Freq: Two times a day (BID) | ORAL | 0 refills | Status: DC

## 2019-12-01 NOTE — Telephone Encounter (Signed)
Patient called in to see if Dr. Etter Sjogren could refill her prescription (CIPROFLOXACN mg250) Also the patient needs to discuss her Lab Results as well. Please call the patient back at [phone] as soon as possible thanks.

## 2019-12-01 NOTE — Telephone Encounter (Signed)
Rx was sent in and labs were discussed.

## 2019-12-02 ENCOUNTER — Other Ambulatory Visit: Payer: PRIVATE HEALTH INSURANCE

## 2019-12-03 ENCOUNTER — Other Ambulatory Visit (INDEPENDENT_AMBULATORY_CARE_PROVIDER_SITE_OTHER): Payer: PRIVATE HEALTH INSURANCE

## 2019-12-03 ENCOUNTER — Other Ambulatory Visit: Payer: Self-pay

## 2019-12-03 DIAGNOSIS — R109 Unspecified abdominal pain: Secondary | ICD-10-CM

## 2019-12-03 LAB — URINALYSIS, ROUTINE W REFLEX MICROSCOPIC
Bilirubin Urine: NEGATIVE
Ketones, ur: NEGATIVE
Nitrite: NEGATIVE
Specific Gravity, Urine: 1.02 (ref 1.000–1.030)
Total Protein, Urine: >=300 — AB
Urine Glucose: 500 — AB
Urobilinogen, UA: >=8 — AB (ref 0.0–1.0)
pH: 6.5 (ref 5.0–8.0)

## 2019-12-04 LAB — URINE CULTURE
MICRO NUMBER:: 10111687
SPECIMEN QUALITY:: ADEQUATE

## 2019-12-05 ENCOUNTER — Encounter: Admit: 2019-12-05 | Discharge: 2019-12-05 | Payer: MEDICARE

## 2019-12-05 MED ORDER — METFORMIN 1,000 MG PO TAB
ORAL_TABLET | Freq: Two times a day (BID) | 1 refills | Status: DC
Start: 2019-12-05 — End: 2020-06-10

## 2019-12-08 ENCOUNTER — Encounter: Payer: Self-pay | Admitting: Family Medicine

## 2019-12-08 ENCOUNTER — Other Ambulatory Visit: Payer: Self-pay

## 2019-12-08 ENCOUNTER — Ambulatory Visit: Payer: PRIVATE HEALTH INSURANCE | Admitting: Family Medicine

## 2019-12-08 DIAGNOSIS — R1030 Lower abdominal pain, unspecified: Secondary | ICD-10-CM

## 2019-12-08 NOTE — Progress Notes (Signed)
Talked with pt briefly--- pain is worse than previous visit  Pt will go to ER

## 2019-12-10 ENCOUNTER — Inpatient Hospital Stay: Payer: PRIVATE HEALTH INSURANCE | Admitting: Internal Medicine

## 2019-12-15 ENCOUNTER — Telehealth: Payer: Self-pay | Admitting: Family Medicine

## 2019-12-15 NOTE — Telephone Encounter (Signed)
Patient is requesting for more pills of Hydrocone recently had a refill of 120 tables on 11/24/19. She wants to know if Dr. Etter Sjogren is able to send her a few more pills to hold her off until her refill is due. Please Advise.

## 2019-12-15 NOTE — Telephone Encounter (Signed)
Last written: 11/24/19 Last ov: 12/08/19 Next ov: none Contract: need UDS: need

## 2019-12-16 ENCOUNTER — Encounter (HOSPITAL_BASED_OUTPATIENT_CLINIC_OR_DEPARTMENT_OTHER): Payer: PRIVATE HEALTH INSURANCE | Admitting: Internal Medicine

## 2019-12-16 NOTE — Telephone Encounter (Signed)
I can not -- its too soon-----  again if her abd pain is that severe--- she needs to go to ER

## 2019-12-16 NOTE — Telephone Encounter (Signed)
[name] [name] called in again. I advised her of previous note. She was upset that Dr. Etter Sjogren didn't call her and tell her. I tried to inform her that Dr. Etter Sjogren was with patients and that [name] was noted to give her a call today.

## 2019-12-17 NOTE — Telephone Encounter (Signed)
Tried to call pt to advise of below and provider additional explanation and offer possible forms of transportation. No opt to leave a message.

## 2019-12-19 ENCOUNTER — Other Ambulatory Visit: Payer: Self-pay | Admitting: Family Medicine

## 2019-12-19 DIAGNOSIS — S32010A Wedge compression fracture of first lumbar vertebra, initial encounter for closed fracture: Secondary | ICD-10-CM

## 2019-12-19 MED ORDER — HYDROCODONE-ACETAMINOPHEN 5-325 MG PO TABS: 1.0000 | ORAL_TABLET | Freq: Four times a day (QID) | ORAL | 0 refills | Status: DC | PRN

## 2019-12-19 NOTE — Telephone Encounter (Signed)
Medication:HYDROcodone-acetaminophen (NORCO/VICODIN) 5-325 MG tablet    Has the patient contacted their pharmacy? No. (If no, request that the patient contact the pharmacy for the refill.) (If yes, when and what did the pharmacy advise?)  Preferred Pharmacy (with phone number or street name):    Agent: Please be advised that RX refills may take up to 3 business days. We ask that you follow-up with your pharmacy.

## 2019-12-19 NOTE — Telephone Encounter (Signed)
Requesting: NORCO Contract: 08/16/2017 UDS: 05/06/2018 Last OV: 12/08/19 Next OV: N/A Last Refill: 11/24/2019, #120--0 RF Database:   Please advise

## 2019-12-22 ENCOUNTER — Other Ambulatory Visit: Payer: Self-pay

## 2019-12-22 ENCOUNTER — Other Ambulatory Visit: Payer: Self-pay | Admitting: Family Medicine

## 2019-12-22 DIAGNOSIS — S32010A Wedge compression fracture of first lumbar vertebra, initial encounter for closed fracture: Secondary | ICD-10-CM

## 2019-12-22 DIAGNOSIS — R109 Unspecified abdominal pain: Secondary | ICD-10-CM

## 2019-12-22 NOTE — Telephone Encounter (Signed)
Please advice  

## 2019-12-22 NOTE — Telephone Encounter (Signed)
Patient states medication was sent to wrong Pharmacy. Patient states this medication should be sent to   [facility]   [address] [place], [place] [address]

## 2019-12-22 NOTE — Telephone Encounter (Signed)
Pt needs clean catch urine-- she if unable to get here she needs to go to ER If she comes in here and pain is worsening 0---- I will send her to er

## 2019-12-22 NOTE — Telephone Encounter (Signed)
Pt's Spouse is requesting a refill on Cipro.  He stated she is still having pain w/urination and abdominal pain.  Pharmacy [facility] [address].

## 2019-12-22 NOTE — Telephone Encounter (Signed)
disregard

## 2019-12-23 NOTE — Addendum Note (Signed)
Addended by: Wynonia Musty A on: 12/23/2019 11:29 AM   Modules accepted: Orders

## 2019-12-23 NOTE — Telephone Encounter (Signed)
Placed orders as future. Advised patient to bring clean catch sample to [facility] as our lab has no availability. Patients husband will do so.

## 2019-12-24 NOTE — Telephone Encounter (Signed)
Could you please deny this to complete in [facility] basket

## 2019-12-25 NOTE — Addendum Note (Signed)
Addended by: Wynonia Musty A on: 12/25/2019 09:30 AM   Modules accepted: Orders

## 2019-12-31 ENCOUNTER — Other Ambulatory Visit: Payer: Self-pay | Admitting: Orthopedic Surgery

## 2019-12-31 DIAGNOSIS — M25551 Pain in right hip: Secondary | ICD-10-CM

## 2020-01-15 ENCOUNTER — Telehealth: Payer: Self-pay

## 2020-01-15 ENCOUNTER — Other Ambulatory Visit: Payer: Self-pay | Admitting: Family Medicine

## 2020-01-15 DIAGNOSIS — S32010A Wedge compression fracture of first lumbar vertebra, initial encounter for closed fracture: Secondary | ICD-10-CM

## 2020-01-15 MED ORDER — HYDROCODONE-ACETAMINOPHEN 5-325 MG PO TABS: 1.0000 | ORAL_TABLET | Freq: Four times a day (QID) | ORAL | 0 refills | Status: DC | PRN

## 2020-01-15 NOTE — Telephone Encounter (Signed)
Patient called in to see if Dr. Etter Sjogren could send in a prescription for HYDROcodone-acetaminophen (NORCO/VICODIN) 5-325 MG tablet [[id number]]    Please send it to : [facility], [place] - [address]  [address] [place], [place] [address]  Phone:  [phone] Fax:  [phone]  DEA #:  [id number]

## 2020-01-15 NOTE — Telephone Encounter (Signed)
Request already sent to Dr. Etter Sjogren

## 2020-01-15 NOTE — Telephone Encounter (Signed)
Medication: HYDROcodone-acetaminophen (NORCO/VICODIN) 5-325 MG tablet [[id number]]    Has the patient contacted their pharmacy? No. (If no, request that the patient contact the pharmacy for the refill.) (If yes, when and what did the pharmacy advise?)  Preferred Pharmacy (with phone number or street name):  [id number] - [place], [place] - [address] AT [place] Phone:  [phone]  Fax:  [phone]       Agent: Please be advised that RX refills may take up to 3 business days. We ask that you follow-up with your pharmacy.

## 2020-01-15 NOTE — Telephone Encounter (Signed)
Requesting: NORCO Contract: 08/16/2017 UDS: 05/06/2018, low risk Last OV: 12/08/19 Next OV: N/A Last Refill: 12/19/19, #120--0 RF Database:   Please advise

## 2020-01-19 ENCOUNTER — Telehealth: Payer: Self-pay | Admitting: Internal Medicine

## 2020-01-19 ENCOUNTER — Telehealth: Payer: Self-pay | Admitting: Family Medicine

## 2020-01-19 NOTE — Telephone Encounter (Signed)
Pt reported that she is experiencing abdominal pain from constipation.  Her last OV was in 2018.  Pt requested an appt ASAP.

## 2020-01-19 NOTE — Telephone Encounter (Signed)
Good Morning. Patient is calling requesting for a letter/Script for a lift chair. She wants to be able to present this letter to her insurance company. Thanks

## 2020-01-19 NOTE — Telephone Encounter (Signed)
Letter done Pt needs to call endo-- they have tried to call her and sent t letter --- she needs to make an appointment

## 2020-01-19 NOTE — Telephone Encounter (Signed)
Spoke with pt and she reports she had an accident and broke her back, she cannot get out except for MD visits. Reports she is taking pain meds for her back and it has been a while since her last BM and it was very painful. Pt states she is unable to pass anything right now. Discussed with pt that she could be impacted and she would have to be seen in he ER for that. Pt states she will go to the ER to be seen.

## 2020-01-19 NOTE — Telephone Encounter (Signed)
Please advise 

## 2020-01-20 ENCOUNTER — Inpatient Hospital Stay (HOSPITAL_COMMUNITY)
Admission: EM | Admit: 2020-01-20 | Discharge: 2020-01-29 | DRG: 460 | Disposition: A | Discharge status: Rehabilitation Facility | Payer: PRIVATE HEALTH INSURANCE | Attending: Family Medicine | Admitting: Family Medicine

## 2020-01-20 ENCOUNTER — Emergency Department (HOSPITAL_COMMUNITY): Payer: PRIVATE HEALTH INSURANCE

## 2020-01-20 ENCOUNTER — Other Ambulatory Visit: Payer: Self-pay

## 2020-01-20 DIAGNOSIS — Z89431 Acquired absence of right foot: Secondary | ICD-10-CM

## 2020-01-20 DIAGNOSIS — I252 Old myocardial infarction: Secondary | ICD-10-CM

## 2020-01-20 DIAGNOSIS — M4646 Discitis, unspecified, lumbar region: Secondary | ICD-10-CM | POA: Diagnosis present

## 2020-01-20 DIAGNOSIS — Z79899 Other long term (current) drug therapy: Secondary | ICD-10-CM

## 2020-01-20 DIAGNOSIS — K2970 Gastritis, unspecified, without bleeding: Secondary | ICD-10-CM | POA: Diagnosis not present

## 2020-01-20 DIAGNOSIS — N183 Chronic kidney disease, stage 3 unspecified: Secondary | ICD-10-CM | POA: Diagnosis present

## 2020-01-20 DIAGNOSIS — I951 Orthostatic hypotension: Secondary | ICD-10-CM | POA: Diagnosis not present

## 2020-01-20 DIAGNOSIS — E441 Mild protein-calorie malnutrition: Secondary | ICD-10-CM | POA: Diagnosis present

## 2020-01-20 DIAGNOSIS — K5909 Other constipation: Secondary | ICD-10-CM | POA: Diagnosis present

## 2020-01-20 DIAGNOSIS — R338 Other retention of urine: Secondary | ICD-10-CM | POA: Diagnosis not present

## 2020-01-20 DIAGNOSIS — E1165 Type 2 diabetes mellitus with hyperglycemia: Secondary | ICD-10-CM | POA: Diagnosis present

## 2020-01-20 DIAGNOSIS — E7800 Pure hypercholesterolemia, unspecified: Secondary | ICD-10-CM | POA: Diagnosis present

## 2020-01-20 DIAGNOSIS — E1169 Type 2 diabetes mellitus with other specified complication: Secondary | ICD-10-CM | POA: Diagnosis present

## 2020-01-20 DIAGNOSIS — I2510 Atherosclerotic heart disease of native coronary artery without angina pectoris: Secondary | ICD-10-CM | POA: Diagnosis not present

## 2020-01-20 DIAGNOSIS — Z9049 Acquired absence of other specified parts of digestive tract: Secondary | ICD-10-CM

## 2020-01-20 DIAGNOSIS — Z9181 History of falling: Secondary | ICD-10-CM

## 2020-01-20 DIAGNOSIS — N1832 Chronic kidney disease, stage 3b: Secondary | ICD-10-CM | POA: Diagnosis not present

## 2020-01-20 DIAGNOSIS — K219 Gastro-esophageal reflux disease without esophagitis: Secondary | ICD-10-CM | POA: Diagnosis present

## 2020-01-20 DIAGNOSIS — Z91013 Allergy to seafood: Secondary | ICD-10-CM | POA: Diagnosis not present

## 2020-01-20 DIAGNOSIS — E1151 Type 2 diabetes mellitus with diabetic peripheral angiopathy without gangrene: Secondary | ICD-10-CM | POA: Diagnosis present

## 2020-01-20 DIAGNOSIS — Z981 Arthrodesis status: Secondary | ICD-10-CM | POA: Diagnosis not present

## 2020-01-20 DIAGNOSIS — M4625 Osteomyelitis of vertebra, thoracolumbar region: Secondary | ICD-10-CM | POA: Diagnosis present

## 2020-01-20 DIAGNOSIS — Z794 Long term (current) use of insulin: Secondary | ICD-10-CM | POA: Diagnosis not present

## 2020-01-20 DIAGNOSIS — M48061 Spinal stenosis, lumbar region without neurogenic claudication: Secondary | ICD-10-CM | POA: Diagnosis present

## 2020-01-20 DIAGNOSIS — Z8619 Personal history of other infectious and parasitic diseases: Secondary | ICD-10-CM | POA: Diagnosis not present

## 2020-01-20 DIAGNOSIS — K7460 Unspecified cirrhosis of liver: Secondary | ICD-10-CM | POA: Diagnosis present

## 2020-01-20 DIAGNOSIS — I10 Essential (primary) hypertension: Secondary | ICD-10-CM | POA: Diagnosis present

## 2020-01-20 DIAGNOSIS — Z8249 Family history of ischemic heart disease and other diseases of the circulatory system: Secondary | ICD-10-CM

## 2020-01-20 DIAGNOSIS — Z886 Allergy status to analgesic agent status: Secondary | ICD-10-CM | POA: Diagnosis not present

## 2020-01-20 DIAGNOSIS — E1122 Type 2 diabetes mellitus with diabetic chronic kidney disease: Secondary | ICD-10-CM | POA: Diagnosis present

## 2020-01-20 DIAGNOSIS — D649 Anemia, unspecified: Secondary | ICD-10-CM | POA: Diagnosis not present

## 2020-01-20 DIAGNOSIS — M009 Pyogenic arthritis, unspecified: Secondary | ICD-10-CM | POA: Diagnosis present

## 2020-01-20 DIAGNOSIS — M4325 Fusion of spine, thoracolumbar region: Secondary | ICD-10-CM | POA: Diagnosis not present

## 2020-01-20 DIAGNOSIS — K228 Other specified diseases of esophagus: Secondary | ICD-10-CM | POA: Diagnosis not present

## 2020-01-20 DIAGNOSIS — Z90721 Acquired absence of ovaries, unilateral: Secondary | ICD-10-CM

## 2020-01-20 DIAGNOSIS — Z20822 Contact with and (suspected) exposure to covid-19: Secondary | ICD-10-CM | POA: Diagnosis present

## 2020-01-20 DIAGNOSIS — Z90710 Acquired absence of both cervix and uterus: Secondary | ICD-10-CM

## 2020-01-20 DIAGNOSIS — Z8739 Personal history of other diseases of the musculoskeletal system and connective tissue: Secondary | ICD-10-CM | POA: Diagnosis not present

## 2020-01-20 DIAGNOSIS — E11621 Type 2 diabetes mellitus with foot ulcer: Secondary | ICD-10-CM | POA: Diagnosis present

## 2020-01-20 DIAGNOSIS — Z6834 Body mass index (BMI) 34.0-34.9, adult: Secondary | ICD-10-CM

## 2020-01-20 DIAGNOSIS — M6088 Other myositis, other site: Secondary | ICD-10-CM | POA: Diagnosis present

## 2020-01-20 DIAGNOSIS — M797 Fibromyalgia: Secondary | ICD-10-CM | POA: Diagnosis present

## 2020-01-20 DIAGNOSIS — Z880 Allergy status to penicillin: Secondary | ICD-10-CM | POA: Diagnosis not present

## 2020-01-20 DIAGNOSIS — E785 Hyperlipidemia, unspecified: Secondary | ICD-10-CM | POA: Diagnosis not present

## 2020-01-20 DIAGNOSIS — E8770 Fluid overload, unspecified: Secondary | ICD-10-CM | POA: Diagnosis not present

## 2020-01-20 DIAGNOSIS — F419 Anxiety disorder, unspecified: Secondary | ICD-10-CM | POA: Diagnosis present

## 2020-01-20 DIAGNOSIS — N189 Chronic kidney disease, unspecified: Secondary | ICD-10-CM | POA: Diagnosis not present

## 2020-01-20 DIAGNOSIS — L97429 Non-pressure chronic ulcer of left heel and midfoot with unspecified severity: Secondary | ICD-10-CM | POA: Diagnosis not present

## 2020-01-20 DIAGNOSIS — D631 Anemia in chronic kidney disease: Secondary | ICD-10-CM | POA: Diagnosis present

## 2020-01-20 DIAGNOSIS — D696 Thrombocytopenia, unspecified: Secondary | ICD-10-CM | POA: Diagnosis not present

## 2020-01-20 DIAGNOSIS — H548 Legal blindness, as defined in USA: Secondary | ICD-10-CM | POA: Diagnosis present

## 2020-01-20 DIAGNOSIS — M4626 Osteomyelitis of vertebra, lumbar region: Secondary | ICD-10-CM | POA: Diagnosis present

## 2020-01-20 DIAGNOSIS — R222 Localized swelling, mass and lump, trunk: Secondary | ICD-10-CM | POA: Diagnosis present

## 2020-01-20 DIAGNOSIS — B965 Pseudomonas (aeruginosa) (mallei) (pseudomallei) as the cause of diseases classified elsewhere: Secondary | ICD-10-CM | POA: Diagnosis not present

## 2020-01-20 DIAGNOSIS — Z841 Family history of disorders of kidney and ureter: Secondary | ICD-10-CM

## 2020-01-20 DIAGNOSIS — L97421 Non-pressure chronic ulcer of left heel and midfoot limited to breakdown of skin: Secondary | ICD-10-CM | POA: Diagnosis not present

## 2020-01-20 DIAGNOSIS — Z7901 Long term (current) use of anticoagulants: Secondary | ICD-10-CM | POA: Diagnosis not present

## 2020-01-20 DIAGNOSIS — Z419 Encounter for procedure for purposes other than remedying health state, unspecified: Secondary | ICD-10-CM

## 2020-01-20 DIAGNOSIS — G4730 Sleep apnea, unspecified: Secondary | ICD-10-CM | POA: Diagnosis present

## 2020-01-20 DIAGNOSIS — Z91030 Bee allergy status: Secondary | ICD-10-CM | POA: Diagnosis not present

## 2020-01-20 DIAGNOSIS — Z7401 Bed confinement status: Secondary | ICD-10-CM

## 2020-01-20 DIAGNOSIS — R300 Dysuria: Secondary | ICD-10-CM | POA: Diagnosis present

## 2020-01-20 DIAGNOSIS — K7030 Alcoholic cirrhosis of liver without ascites: Secondary | ICD-10-CM | POA: Diagnosis not present

## 2020-01-20 DIAGNOSIS — I129 Hypertensive chronic kidney disease with stage 1 through stage 4 chronic kidney disease, or unspecified chronic kidney disease: Secondary | ICD-10-CM | POA: Diagnosis present

## 2020-01-20 DIAGNOSIS — Z888 Allergy status to other drugs, medicaments and biological substances status: Secondary | ICD-10-CM | POA: Diagnosis not present

## 2020-01-20 DIAGNOSIS — M8580 Other specified disorders of bone density and structure, unspecified site: Secondary | ICD-10-CM | POA: Diagnosis present

## 2020-01-20 DIAGNOSIS — M4856XA Collapsed vertebra, not elsewhere classified, lumbar region, initial encounter for fracture: Secondary | ICD-10-CM | POA: Diagnosis present

## 2020-01-20 DIAGNOSIS — F4310 Post-traumatic stress disorder, unspecified: Secondary | ICD-10-CM | POA: Diagnosis present

## 2020-01-20 DIAGNOSIS — N179 Acute kidney failure, unspecified: Secondary | ICD-10-CM | POA: Diagnosis not present

## 2020-01-20 DIAGNOSIS — E1142 Type 2 diabetes mellitus with diabetic polyneuropathy: Secondary | ICD-10-CM | POA: Diagnosis present

## 2020-01-20 DIAGNOSIS — Z86718 Personal history of other venous thrombosis and embolism: Secondary | ICD-10-CM | POA: Diagnosis not present

## 2020-01-20 DIAGNOSIS — J45909 Unspecified asthma, uncomplicated: Secondary | ICD-10-CM | POA: Diagnosis present

## 2020-01-20 DIAGNOSIS — E669 Obesity, unspecified: Secondary | ICD-10-CM | POA: Diagnosis present

## 2020-01-20 DIAGNOSIS — Z833 Family history of diabetes mellitus: Secondary | ICD-10-CM

## 2020-01-20 DIAGNOSIS — M4640 Discitis, unspecified, site unspecified: Secondary | ICD-10-CM

## 2020-01-20 DIAGNOSIS — Z91018 Allergy to other foods: Secondary | ICD-10-CM | POA: Diagnosis not present

## 2020-01-20 DIAGNOSIS — Z4789 Encounter for other orthopedic aftercare: Secondary | ICD-10-CM | POA: Diagnosis not present

## 2020-01-20 DIAGNOSIS — F329 Major depressive disorder, single episode, unspecified: Secondary | ICD-10-CM | POA: Diagnosis present

## 2020-01-20 DIAGNOSIS — Z09 Encounter for follow-up examination after completed treatment for conditions other than malignant neoplasm: Secondary | ICD-10-CM

## 2020-01-20 DIAGNOSIS — K5900 Constipation, unspecified: Secondary | ICD-10-CM | POA: Diagnosis not present

## 2020-01-20 LAB — COMPREHENSIVE METABOLIC PANEL
ALT: 9 U/L (ref 0–44)
AST: 9 U/L — ABNORMAL LOW (ref 15–41)
Albumin: 2.3 g/dL — ABNORMAL LOW (ref 3.5–5.0)
Alkaline Phosphatase: 99 U/L (ref 38–126)
Anion gap: 9 (ref 5–15)
BUN: 13 mg/dL (ref 8–23)
CO2: 28 mmol/L (ref 22–32)
Calcium: 9 mg/dL (ref 8.9–10.3)
Chloride: 99 mmol/L (ref 98–111)
Creatinine, Ser: 1.35 mg/dL — ABNORMAL HIGH (ref 0.44–1.00)
GFR calc Af Amer: 48 mL/min — ABNORMAL LOW (ref >60)
GFR calc non Af Amer: 41 mL/min — ABNORMAL LOW (ref >60)
Glucose, Bld: 298 mg/dL — ABNORMAL HIGH (ref 70–99)
Potassium: 4.2 mmol/L (ref 3.5–5.1)
Sodium: 136 mmol/L (ref 135–145)
Total Bilirubin: 0.8 mg/dL (ref 0.3–1.2)
Total Protein: 7.7 g/dL (ref 6.5–8.1)

## 2020-01-20 LAB — CBC
HCT: 32.2 % — ABNORMAL LOW (ref 36.0–46.0)
Hemoglobin: 9.8 g/dL — ABNORMAL LOW (ref 12.0–15.0)
MCH: 25.7 pg — ABNORMAL LOW (ref 26.0–34.0)
MCHC: 30.4 g/dL (ref 30.0–36.0)
MCV: 84.3 fL (ref 80.0–100.0)
Platelets: 231 10*3/uL (ref 150–400)
RBC: 3.82 MIL/uL — ABNORMAL LOW (ref 3.87–5.11)
RDW: 14.3 % (ref 11.5–15.5)
WBC: 7.6 10*3/uL (ref 4.0–10.5)
nRBC: 0 % (ref 0.0–0.2)

## 2020-01-20 LAB — SARS CORONAVIRUS 2 (TAT 6-24 HRS): SARS Coronavirus 2: NEGATIVE

## 2020-01-20 LAB — LIPASE, BLOOD: Lipase: 37 U/L (ref 11–51)

## 2020-01-20 MED ORDER — SODIUM CHLORIDE 0.9 % IV BOLUS
1000.0000 mL | Freq: Once | INTRAVENOUS | Status: AC
  Administered 2020-01-20: 1000 mL via INTRAVENOUS

## 2020-01-20 MED ORDER — HYDROMORPHONE HCL 1 MG/ML IJ SOLN
1.0000 mg | Freq: Once | INTRAMUSCULAR | Status: AC
  Administered 2020-01-20: 1 mg via INTRAVENOUS
  Filled 2020-01-20: qty 1

## 2020-01-20 MED ORDER — SODIUM CHLORIDE 0.9% FLUSH
3.0000 mL | Freq: Once | INTRAVENOUS | Status: AC
  Administered 2020-01-20: 3 mL via INTRAVENOUS

## 2020-01-20 MED ORDER — LORAZEPAM 2 MG/ML IJ SOLN
1.0000 mg | Freq: Once | INTRAMUSCULAR | Status: AC
  Administered 2020-01-20: 1 mg via INTRAVENOUS
  Filled 2020-01-20: qty 1

## 2020-01-20 MED ORDER — GADOBUTROL 1 MMOL/ML IV SOLN
8.0000 mL | Freq: Once | INTRAVENOUS | Status: AC | PRN
  Administered 2020-01-20: 8 mL via INTRAVENOUS
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
## 2020-01-20 NOTE — ED Notes (Signed)
Pt transported to CT ?

## 2020-01-20 NOTE — ED Notes (Signed)
Pt c/o constipation, lower abd pain, and lower back pain that radiates down both legs down through legs; also c/o L neck pain, denies injury; c/o bilateral hip swelling; pt states she fell in January and got stress fractures in her back, has had back pain since then; last BM x 2 weeks ago; states she has tried "all kinds of medicines"; states she has fibromyalgia, takes prescribed pain medicine

## 2020-01-20 NOTE — ED Notes (Signed)
Husband, [phone] to be called when pt in room.

## 2020-01-20 NOTE — ED Notes (Signed)
Pt transported to MRI 

## 2020-01-20 NOTE — ED Notes (Signed)
Admitting physician at bedside

## 2020-01-20 NOTE — ED Notes (Signed)
Pt. Alert and oriented x4. Pain reassessed. Repositioning offered. Pt. Declined.

## 2020-01-20 NOTE — ED Notes (Signed)
Husband at bedside.  

## 2020-01-21 ENCOUNTER — Encounter (HOSPITAL_COMMUNITY): Payer: Self-pay | Admitting: Internal Medicine

## 2020-01-21 ENCOUNTER — Encounter: Admit: 2020-01-21 | Discharge: 2020-01-21 | Payer: MEDICARE

## 2020-01-21 DIAGNOSIS — Z888 Allergy status to other drugs, medicaments and biological substances status: Secondary | ICD-10-CM

## 2020-01-21 DIAGNOSIS — L97429 Non-pressure chronic ulcer of left heel and midfoot with unspecified severity: Secondary | ICD-10-CM

## 2020-01-21 DIAGNOSIS — Z8739 Personal history of other diseases of the musculoskeletal system and connective tissue: Secondary | ICD-10-CM

## 2020-01-21 DIAGNOSIS — I10 Essential (primary) hypertension: Secondary | ICD-10-CM

## 2020-01-21 DIAGNOSIS — M4625 Osteomyelitis of vertebra, thoracolumbar region: Secondary | ICD-10-CM | POA: Diagnosis present

## 2020-01-21 DIAGNOSIS — R222 Localized swelling, mass and lump, trunk: Secondary | ICD-10-CM

## 2020-01-21 DIAGNOSIS — Z886 Allergy status to analgesic agent status: Secondary | ICD-10-CM

## 2020-01-21 DIAGNOSIS — M4646 Discitis, unspecified, lumbar region: Principal | ICD-10-CM

## 2020-01-21 DIAGNOSIS — Z794 Long term (current) use of insulin: Secondary | ICD-10-CM

## 2020-01-21 DIAGNOSIS — E1165 Type 2 diabetes mellitus with hyperglycemia: Secondary | ICD-10-CM

## 2020-01-21 DIAGNOSIS — N1832 Chronic kidney disease, stage 3b: Secondary | ICD-10-CM

## 2020-01-21 DIAGNOSIS — M4640 Discitis, unspecified, site unspecified: Secondary | ICD-10-CM

## 2020-01-21 DIAGNOSIS — Z91018 Allergy to other foods: Secondary | ICD-10-CM

## 2020-01-21 DIAGNOSIS — Z91030 Bee allergy status: Secondary | ICD-10-CM

## 2020-01-21 DIAGNOSIS — Z91013 Allergy to seafood: Secondary | ICD-10-CM

## 2020-01-21 DIAGNOSIS — K5900 Constipation, unspecified: Secondary | ICD-10-CM

## 2020-01-21 DIAGNOSIS — Z8619 Personal history of other infectious and parasitic diseases: Secondary | ICD-10-CM

## 2020-01-21 DIAGNOSIS — Z880 Allergy status to penicillin: Secondary | ICD-10-CM

## 2020-01-21 LAB — URINALYSIS, ROUTINE W REFLEX MICROSCOPIC
Bilirubin Urine: NEGATIVE
Glucose, UA: 150 mg/dL — AB
Ketones, ur: NEGATIVE mg/dL
Nitrite: NEGATIVE
Protein, ur: 100 mg/dL — AB
Specific Gravity, Urine: 1.016 (ref 1.005–1.030)
WBC, UA: >50 WBC/hpf — ABNORMAL HIGH (ref 0–5)
pH: 5 (ref 5.0–8.0)

## 2020-01-21 LAB — COMPREHENSIVE METABOLIC PANEL
ALT: 8 U/L (ref 0–44)
AST: 10 U/L — ABNORMAL LOW (ref 15–41)
Albumin: 2.1 g/dL — ABNORMAL LOW (ref 3.5–5.0)
Alkaline Phosphatase: 95 U/L (ref 38–126)
Anion gap: 8 (ref 5–15)
BUN: 14 mg/dL (ref 8–23)
CO2: 26 mmol/L (ref 22–32)
Calcium: 8.7 mg/dL — ABNORMAL LOW (ref 8.9–10.3)
Chloride: 102 mmol/L (ref 98–111)
Creatinine, Ser: 1.55 mg/dL — ABNORMAL HIGH (ref 0.44–1.00)
GFR calc Af Amer: 41 mL/min — ABNORMAL LOW (ref >60)
GFR calc non Af Amer: 35 mL/min — ABNORMAL LOW (ref >60)
Glucose, Bld: 163 mg/dL — ABNORMAL HIGH (ref 70–99)
Potassium: 4.3 mmol/L (ref 3.5–5.1)
Sodium: 136 mmol/L (ref 135–145)
Total Bilirubin: 0.6 mg/dL (ref 0.3–1.2)
Total Protein: 6.9 g/dL (ref 6.5–8.1)

## 2020-01-21 LAB — CBC WITH DIFFERENTIAL/PLATELET
Abs Immature Granulocytes: 0.03 10*3/uL (ref 0.00–0.07)
Basophils Absolute: 0 10*3/uL (ref 0.0–0.1)
Basophils Relative: 0 %
Eosinophils Absolute: 0.1 10*3/uL (ref 0.0–0.5)
Eosinophils Relative: 1 %
HCT: 29.1 % — ABNORMAL LOW (ref 36.0–46.0)
Hemoglobin: 8.8 g/dL — ABNORMAL LOW (ref 12.0–15.0)
Immature Granulocytes: 0 %
Lymphocytes Relative: 20 %
Lymphs Abs: 1.6 10*3/uL (ref 0.7–4.0)
MCH: 25.6 pg — ABNORMAL LOW (ref 26.0–34.0)
MCHC: 30.2 g/dL (ref 30.0–36.0)
MCV: 84.6 fL (ref 80.0–100.0)
Monocytes Absolute: 0.8 10*3/uL (ref 0.1–1.0)
Monocytes Relative: 10 %
Neutro Abs: 5.2 10*3/uL (ref 1.7–7.7)
Neutrophils Relative %: 69 %
Platelets: 211 10*3/uL (ref 150–400)
RBC: 3.44 MIL/uL — ABNORMAL LOW (ref 3.87–5.11)
RDW: 14.6 % (ref 11.5–15.5)
WBC: 7.7 10*3/uL (ref 4.0–10.5)
nRBC: 0 % (ref 0.0–0.2)

## 2020-01-21 LAB — SURGICAL PCR SCREEN
MRSA, PCR: NEGATIVE
Staphylococcus aureus: POSITIVE — AB

## 2020-01-21 LAB — MRSA PCR SCREENING: MRSA by PCR: NEGATIVE

## 2020-01-21 LAB — ABO/RH: ABO/RH(D): A NEG

## 2020-01-21 LAB — GLUCOSE, CAPILLARY
Glucose-Capillary: 176 mg/dL — ABNORMAL HIGH (ref 70–99)
Glucose-Capillary: 132 mg/dL — ABNORMAL HIGH (ref 70–99)
Glucose-Capillary: 105 mg/dL — ABNORMAL HIGH (ref 70–99)
Glucose-Capillary: 178 mg/dL — ABNORMAL HIGH (ref 70–99)
Glucose-Capillary: 145 mg/dL — ABNORMAL HIGH (ref 70–99)

## 2020-01-21 MED ORDER — CYCLOBENZAPRINE 5 MG PO TAB
ORAL_TABLET | Freq: Every evening | ORAL | 3 refills | 30.00000 days | Status: AC
Start: 2020-01-21 — End: ?

## 2020-01-21 MED ORDER — CLONIDINE HCL 0.1 MG PO TAB
ORAL_TABLET | Freq: Three times a day (TID) | 2 refills | Status: AC
Start: 2020-01-21 — End: ?

## 2020-01-21 MED ORDER — PANTOPRAZOLE 40 MG PO TBEC
ORAL_TABLET | Freq: Every day | ORAL | 1 refills | 90.00000 days | Status: AC
Start: 2020-01-21 — End: ?

## 2020-01-21 MED ORDER — BETAMETHASONE VALERATE 0.1 % TP CREA
Freq: Every day | 1 refills | 7.00000 days | Status: DC
Start: 2020-01-21 — End: 2020-04-13

## 2020-01-21 MED ORDER — HYDROMORPHONE HCL 1 MG/ML IJ SOLN
0.5000 mg | INTRAMUSCULAR | Status: DC | PRN
  Administered 2020-01-21 (×3): 0.5 mg via INTRAVENOUS
  Filled 2020-01-21 (×3): qty 1

## 2020-01-21 MED ORDER — SENNA 8.6 MG PO TABS
1.0000 | ORAL_TABLET | Freq: Every day | ORAL | Status: DC
  Administered 2020-01-21: 8.6 mg via ORAL
  Filled 2020-01-21: qty 1

## 2020-01-21 MED ORDER — HYDROMORPHONE HCL 1 MG/ML IJ SOLN
0.5000 mg | INTRAMUSCULAR | Status: DC | PRN
  Administered 2020-01-21 – 2020-01-22 (×2): 0.5 mg via INTRAVENOUS
  Filled 2020-01-21 (×2): qty 1

## 2020-01-21 MED ORDER — ONDANSETRON HCL 4 MG PO TABS: 4.0000 mg | ORAL_TABLET | Freq: Four times a day (QID) | ORAL | Status: DC | PRN

## 2020-01-21 MED ORDER — INSULIN GLARGINE 100 UNIT/ML ~~LOC~~ SOLN
20.0000 [IU] | Freq: Every day | SUBCUTANEOUS | Status: DC
  Administered 2020-01-21 – 2020-01-28 (×8): 20 [IU] via SUBCUTANEOUS
  Filled 2020-01-21 (×9): qty 0.2

## 2020-01-21 MED ORDER — GLUCERNA SHAKE PO LIQD
237.0000 mL | Freq: Three times a day (TID) | ORAL | Status: DC
  Administered 2020-01-21 – 2020-01-29 (×19): 237 mL via ORAL

## 2020-01-21 MED ORDER — PREGABALIN 75 MG PO CAPS
150.0000 mg | ORAL_CAPSULE | Freq: Two times a day (BID) | ORAL | Status: DC
  Administered 2020-01-21 – 2020-01-29 (×16): 150 mg via ORAL
  Filled 2020-01-21 (×16): qty 2

## 2020-01-21 MED ORDER — LIDOCAINE 5 % EX PTCH
1.0000 | MEDICATED_PATCH | CUTANEOUS | Status: DC
  Administered 2020-01-21 – 2020-01-29 (×8): 1 via TRANSDERMAL
  Filled 2020-01-21 (×7): qty 1

## 2020-01-21 MED ORDER — DULOXETINE HCL 60 MG PO CPEP
60.0000 mg | ORAL_CAPSULE | Freq: Every day | ORAL | Status: DC
  Administered 2020-01-21 – 2020-01-29 (×8): 60 mg via ORAL
  Filled 2020-01-21 (×8): qty 1

## 2020-01-21 MED ORDER — BISACODYL 10 MG RE SUPP
10.0000 mg | Freq: Once | RECTAL | Status: AC
  Administered 2020-01-21: 10 mg via RECTAL
  Filled 2020-01-21: qty 1

## 2020-01-21 MED ORDER — ONDANSETRON HCL 4 MG/2ML IJ SOLN: 4.0000 mg | Freq: Four times a day (QID) | INTRAMUSCULAR | Status: DC | PRN

## 2020-01-21 MED ORDER — ATORVASTATIN CALCIUM 10 MG PO TABS
10.0000 mg | ORAL_TABLET | Freq: Every day | ORAL | Status: DC
  Administered 2020-01-21 – 2020-01-29 (×8): 10 mg via ORAL
  Filled 2020-01-21 (×8): qty 1

## 2020-01-21 MED ORDER — ADULT MULTIVITAMIN W/MINERALS CH
1.0000 | ORAL_TABLET | Freq: Every day | ORAL | Status: DC
  Administered 2020-01-21 – 2020-01-29 (×8): 1 via ORAL
  Filled 2020-01-21 (×8): qty 1

## 2020-01-21 MED ORDER — INSULIN GLARGINE 100 UNIT/ML ~~LOC~~ SOLN
30.0000 [IU] | Freq: Every day | SUBCUTANEOUS | Status: DC
  Administered 2020-01-21 – 2020-01-28 (×8): 30 [IU] via SUBCUTANEOUS
  Filled 2020-01-21 (×10): qty 0.3

## 2020-01-21 MED ORDER — INSULIN ASPART 100 UNIT/ML ~~LOC~~ SOLN
0.0000 [IU] | Freq: Three times a day (TID) | SUBCUTANEOUS | Status: DC
  Administered 2020-01-21: 17:00:00 2 [IU] via SUBCUTANEOUS
  Administered 2020-01-21: 1 [IU] via SUBCUTANEOUS
  Administered 2020-01-23 (×3): 3 [IU] via SUBCUTANEOUS
  Administered 2020-01-24 (×3): 2 [IU] via SUBCUTANEOUS
  Administered 2020-01-25 – 2020-01-26 (×4): 1 [IU] via SUBCUTANEOUS
  Administered 2020-01-27: 2 [IU] via SUBCUTANEOUS
  Administered 2020-01-27: 1 [IU] via SUBCUTANEOUS
  Administered 2020-01-28 – 2020-01-29 (×4): 2 [IU] via SUBCUTANEOUS
  Administered 2020-01-29: 3 [IU] via SUBCUTANEOUS

## 2020-01-21 NOTE — H&P (Addendum)
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 Drucilla Schmidt infectious disease consultant at this time requested of holding off antibiotics until we get tissue for culture from the possible osteomyelitis.  Infectious disease will be following.   DVT prophylaxis: We will keep patient SCDs and avoid anticoagulation anticipation of  possible surgical procedure for the spine. Code Status: Full code. Family Communication: Discussed with patient. Disposition Plan: To be determined. Consults called: Neurosurgery. Admission status: Inpatient.   Rise Patience MD [facility] Pager [phone].  If 7PM-7AM, please contact night-coverage [email or web address] Password [facility]  01/21/2020, 12:18 AM

## 2020-01-21 NOTE — Consult Note (Signed)
[place] for Infectious Disease    Date of Admission:  01/20/2020          Reason for Consult: Acute thoracolumbar osteomyelitis    Referring Provider: Dr. Marylu Lund  Assessment: It looks like she has developed acute thoracolumbar osteomyelitis.  I recommend holding off on empiric antibiotic therapy.  She needs a biopsy of L1, either by IR or neurosurgery at the time of debridement and stabilization.  I would like specimens sent for routine pathology, Gram stain and routine culture and AFB stain and culture.  Plan: 1. Recommend L1 biopsy 2. Observe off antibiotics for now 3. Sed rate and C-reactive protein  Principal Problem:   Osteomyelitis of vertebra of thoracolumbar region [facility]) Active Problems:   Paraspinal mass   Ulcer of left heel, limited to breakdown of skin (HCC)   Septic arthritis of elbow, right (HCC)   Essential hypertension   Hepatic cirrhosis (HCC)   CAD (coronary artery disease)   CKD (chronic kidney disease), stage III ([place])   Type 2 diabetes mellitus with hyperglycemia, with long-term current use of insulin (HCC)   Scheduled Meds: . atorvastatin  10 mg Oral Daily  . DULoxetine  60 mg Oral Daily  . insulin aspart  0-9 Units Subcutaneous TID WC  . insulin glargine  20 Units Subcutaneous Daily  . insulin glargine  30 Units Subcutaneous Q2200  . lidocaine  1 patch Transdermal Q24H  . pregabalin  150 mg Oral BID  . senna  1 tablet Oral Daily   Continuous Infusions: PRN Meds:.HYDROmorphone (DILAUDID) injection, ondansetron **OR** ondansetron (ZOFRAN) IV  HPI: [name] [name] is a 65 y.o. adult who is well-known to our service.  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  She received long courses of IV antibiotics throughout the last 6 months of last year.  When hospitalized in January she was switched to oral doxycycline for chronic suppression.  She missed her follow-up visit with me in February.   Unbeknownst to me she could not tolerate doxycycline and stopped it shortly after discharge in January. She has also been treated for polymicrobial left heel infection.  Previous cultures have grown Enterococcus, Proteus, Klebsiella and Pseudomonas.  She began having severe low back pain last month the pain has become progressively severe and she has not been able to get out of bed.  As a result she has missed many doctor appointments.  She came to the ED yesterday.  MRI revealed:  IMPRESSION: Destruction of the L1 vertebral body with partial destruction of the inferior endplates of Q68 and L2 with abnormal enhancing paraspinal and epidural soft tissues at that level. No definable abscess. I suspect this represents atypical infection such as TB or actinomycosis. B-cell lymphoma can also give this appearance. There is moderate compression of the thecal sac at L1 as described above.   Review of Systems: Review of Systems  Constitutional: Positive for malaise/fatigue. Negative for chills, diaphoresis and fever.  HENT: Negative for congestion and sore throat.   Respiratory: Negative for cough and shortness of breath.   Cardiovascular: Negative for chest pain.  Gastrointestinal: Positive for constipation. Negative for abdominal pain, diarrhea, nausea and vomiting.  Genitourinary: Negative for dysuria.  Musculoskeletal: Positive for back pain.  Skin: Negative for rash.    Past Medical History:  Diagnosis Date  . Acute MI [facility]) 2007   presented to ED & had cardiac cath- but found to  have normal coronaries. Since that point in time her PCP cares f or cardiac needs. Dr. [name] - [facility]  . Anemia   . Anginal pain ([place])   . Anxiety   . Asthma   . Bulging lumbar disc   . Cataract   . 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 Cirrhosis of liver without mention of alcohol   . Constipation   . Dehiscence of closure of skin    left  partial calcaneal excision  . Depression   . Diabetes mellitus    insulin dependent, adult onset  . Episode of visual loss of left eye   . Exertional shortness of breath   . Fatty liver   . Fibromyalgia   . GERD (gastroesophageal reflux disease)   . Hepatic steatosis   . High cholesterol   . Hypertension   . MRSA (methicillin resistant Staphylococcus aureus)   . Neuropathy    lower legs  . Osteoarthritis    hands, hips  . Proximal humerus fracture 10/15/12   Left  . PTSD (post-traumatic stress disorder)   . Renal insufficiency 05/05/2015    Social History   Tobacco Use  . Smoking status: Never Smoker  . Smokeless tobacco: Never Used  Substance Use Topics  . Alcohol use: No    Alcohol/week: 0.0 standard drinks  . Drug use: No    Family History  Problem Relation Age of Onset  . Heart disease Father   . Diabetes Father   . Colitis Father   . Crohn's disease Father   . Cancer Father        leukemia  . Leukemia Father   . Diabetes Mellitus II Brother   . Kidney disease Brother   . Heart disease Brother   . Diabetes Mother   . Hypertension Mother   . Mental illness Mother   . Irritable bowel syndrome Daughter   . Diabetes Mellitus II Brother   . Kidney disease Brother   . Liver disease Brother   . Kidney disease Brother   . Heart attack Brother   . Diabetes Mellitus II Brother   . Heart disease Brother   . Liver disease Brother   . Kidney disease Brother   . Kidney disease Brother   . Diabetes Mellitus II Brother   . Diabetes Mellitus I Brother    Allergies  Allergen Reactions  . Bee Pollen Anaphylaxis  . Fish-Derived Products Hives, Shortness Of Breath, Swelling and Rash    Hives get in throat causing trouble breathing  . Mushroom Extract Complex Anaphylaxis  . Penicillins Anaphylaxis    Did it involve swelling of the face/tongue/throat, SOB, or low BP? Yes Did it involve sudden or severe rash/hives, skin peeling, or any reaction on the inside of your  mouth or nose? No Did you need to seek medical attention at a hospital or doctor's office? Yes When did it last happen?A few months ago If all above answers are "NO", may proceed with cephalosporin use.  [name] Kitchen Rosemary Oil Anaphylaxis  . Shellfish Allergy Hives, Shortness Of Breath, Swelling and Rash  . Tomato Hives and Shortness Of Breath    Hives in throat causes her trouble breathing  . Acetaminophen Other (See Comments)    GI upset  . Acyclovir And Related Other (See Comments)    Unknown rxn  . Aloe Vera Hives  . Broccoli [Brassica Oleracea] Hives  . Naproxen Other (See Comments)  Unknown rxn    OBJECTIVE: Blood pressure 135/75, pulse 85, temperature 98.4 F (36.9 C), temperature source Oral, resp. rate 18, height 5' 1"  (1.549 m), weight 81.7 kg, SpO2 95 %.  Physical Exam Constitutional:      Comments: She is resting quietly in bed.  She is tearful when describing her pain.  Cardiovascular:     Rate and Rhythm: Normal rate and regular rhythm.     Heart sounds: No murmur.  Pulmonary:     Effort: Pulmonary effort is normal.     Breath sounds: Normal breath sounds.  Abdominal:     Palpations: Abdomen is soft.     Tenderness: There is no abdominal tenderness.  Musculoskeletal:     Comments: The chronic sinus tract over her right lateral elbow finally healed over.  There is no unusual redness or swelling.  The large chronic plantar ulcer heel.  Skin:    Findings: No rash.  Neurological:     General: No focal deficit present.       Lab Results Lab Results  Component Value Date   WBC 7.7 01/21/2020   HGB 8.8 (L) 01/21/2020   HCT 29.1 (L) 01/21/2020   MCV 84.6 01/21/2020   PLT 211 01/21/2020    Lab Results  Component Value Date   CREATININE 1.55 (H) 01/21/2020   BUN 14 01/21/2020   NA 136 01/21/2020   K 4.3 01/21/2020   CL 102 01/21/2020   CO2 26 01/21/2020    Lab Results  Component Value Date   ALT 8 01/21/2020   AST 10 (L) 01/21/2020   ALKPHOS 95  01/21/2020   BILITOT 0.6 01/21/2020     Microbiology: Recent Results (from the past 240 hour(s))  Blood culture (routine x 2)     Status: None (Preliminary result)   Collection Time: 01/20/20  1:34 PM   Specimen: BLOOD RIGHT HAND  Result Value Ref Range Status   Specimen Description BLOOD RIGHT HAND  Final   Special Requests   Final    BOTTLES DRAWN AEROBIC ONLY Blood Culture results may not be optimal due to an inadequate volume of blood received in culture bottles   Culture   Final    NO GROWTH < 24 HOURS Performed at [facility], [place] [address] [place], [place] [address]    Report Status PENDING  Incomplete  Blood culture (routine x 2)     Status: None (Preliminary result)   Collection Time: 01/20/20  3:28 PM   Specimen: BLOOD  Result Value Ref Range Status   Specimen Description BLOOD RIGHT ANTECUBITAL  Final   Special Requests   Final    BOTTLES DRAWN AEROBIC AND ANAEROBIC Blood Culture adequate volume   Culture   Final    NO GROWTH < 24 HOURS Performed at [facility], [place] [address] [place], [place] [address]    Report Status PENDING  Incomplete  SARS CORONAVIRUS 2 (TAT 6-24 HRS) Nasopharyngeal Nasopharyngeal Swab     Status: None   Collection Time: 01/20/20  3:37 PM   Specimen: Nasopharyngeal Swab  Result Value Ref Range Status   SARS Coronavirus 2 NEGATIVE NEGATIVE Final    Comment: (NOTE) SARS-CoV-2 target nucleic acids are NOT DETECTED. The SARS-CoV-2 RNA is generally detectable in upper and lower respiratory specimens during the acute phase of infection. Negative results do not preclude SARS-CoV-2 infection, do not rule out co-infections with other pathogens, and should not be used as the sole basis for treatment  or other patient management decisions. Negative results must be combined with clinical observations, patient history, and epidemiological information. The expected result is Negative. Fact Sheet for  Patients: SugarRoll.be Fact Sheet for Healthcare Providers: https://www.woods-mathews.com/ This test is not yet approved or cleared by the [place] FDA and  has been authorized for detection and/or diagnosis of SARS-CoV-2 by FDA under an Emergency Use Authorization (EUA). This EUA will remain  in effect (meaning this test can be used) for the duration of the COVID-19 declaration under Section 56 4(b)(1) of the Act, 21 U.S.C. section 360bbb-3(b)(1), unless the authorization is terminated or revoked sooner. Performed at [facility], [place] [address], [place], [place] [address]   MRSA PCR Screening     Status: None   Collection Time: 01/21/20  5:25 AM   Specimen: Nasal Mucosa; Nasopharyngeal  Result Value Ref Range Status   MRSA by PCR NEGATIVE NEGATIVE Final    Comment:        The GeneXpert MRSA Assay (FDA approved for NASAL specimens only), is one component of a comprehensive MRSA colonization surveillance program. It is not intended to diagnose MRSA infection nor to guide or monitor treatment for MRSA infections. Performed at [facility], [place] [address], [place],  [address]     Michel Bickers, [place] [facility] Infectious [place] [facility] [phone] pager   [phone] cell 01/21/2020, 10:16 AM

## 2020-01-21 NOTE — Progress Notes (Signed)
Subjective: Patient reports continued back pain  Objective: Vital signs in last 24 hours: Temp:  [98.4 F (36.9 C)-98.6 F (37 C)] 98.4 F (36.9 C) (03/24 0740) Pulse Rate:  [79-91] 85 (03/24 0740) Resp:  [13-18] 18 (03/24 0740) BP: (122-154)/(56-77) 135/75 (03/24 0740) SpO2:  [87 %-99 %] 95 % (03/24 0740) Weight:  [81.7 kg] 81.7 kg (03/23 2300)  Intake/Output from previous day: 03/23 0701 - 03/24 0700 In: 1082.3 [IV Piggyback:1082.3] Out: 701 [Urine:700; Stool:1] Intake/Output this shift: No intake/output data recorded.  Grossly neurologically intact  Lab Results: Recent Labs    01/20/20 1056 01/21/20 0619  WBC 7.6 7.7  HGB 9.8* 8.8*  HCT 32.2* 29.1*  PLT 231 211   BMET Recent Labs    01/20/20 1056 01/21/20 0619  NA 136 136  K 4.2 4.3  CL 99 102  CO2 28 26  GLUCOSE 298* 163*  BUN 13 14  CREATININE 1.35* 1.55*  CALCIUM 9.0 8.7*    Studies/Results: CT ABDOMEN PELVIS WO CONTRAST  Result Date: 01/20/2020 CLINICAL DATA:  Lower abdominal pain, constipation EXAM: CT ABDOMEN AND PELVIS WITHOUT CONTRAST TECHNIQUE: Multidetector CT imaging of the abdomen and pelvis was performed following the standard protocol without IV contrast. COMPARISON:  11/15/2019 FINDINGS: Lower chest: No acute pleural or parenchymal lung disease. Hepatobiliary: Nodular contour of the liver compatible with cirrhosis, stable. Calcified granulomata are seen within the liver parenchyma. The gallbladder is surgically absent. Pancreas: Unremarkable. No pancreatic ductal dilatation or surrounding inflammatory changes. Spleen: Normal in size without focal abnormality. Adrenals/Urinary Tract: No urinary tract calculi or obstructive uropathy. The bladder is unremarkable. The adrenals are normal. Stomach/Bowel: No bowel obstruction or ileus. Moderate fecal retention. Normal appendix right lower quadrant. No bowel wall thickening or inflammatory changes. Vascular/Lymphatic: Multiple subcentimeter lymph nodes  are seen within the retroperitoneum, increased in number since prior study. No pathologically enlarged lymph nodes. Vascular calcifications are again seen throughout the aorta and its branches. Reproductive: Status post hysterectomy. No adnexal masses. Other: There is no free fluid or free gas within the abdomen or pelvis. Musculoskeletal: Since the prior exam, there is been progression of the compression deformity at L1, with marked bony resorption and development of vertebral plana. There is also cortical erosion within the superior endplate of L2 and inferior endplate of T51. Overall, the appearance is highly concerning for progressive discitis/osteomyelitis. There is mild retropulsion of the L1 vertebral body with at least 50% narrowing of the central canal at the L1 level. MRI is recommended for further evaluation and to better assess to the mass effect upon the central canal and neural foramina. There is no obvious fluid collection or abscess. Effacement of the fat planes between the vertebral bodies and paraspinous musculature. IMPRESSION: 1. Interval development of L1 vertebral plana with marked bony resorption of the L1 vertebral body as well as erosive changes of the inferior T12 endplate and superior L2 endplate. Overall, the appearance and rapid progression is concerning for underlying discitis/osteomyelitis. 2. Slight retropulsion and phlegmon at the L1 level results in at least 50% narrowing of the central canal. MRI is recommended for further evaluation. 3. No evidence of paraspinal abscess on this unenhanced exam. There is effacement of the fat planes between the upper lumbar spine and paraspinous musculature. Again, MRI would be useful for further evaluation. 4. Moderate fecal retention. 5. Cirrhosis. These results were called by telephone at the time of interpretation on 01/20/2020 at 3:13 pm to provider [facility] , who verbally acknowledged these results. Electronically  Signed   By: Randa Ngo M.D.   On: 01/20/2020 15:13   MR THORACIC SPINE W WO CONTRAST  Result Date: 01/20/2020 CLINICAL DATA:  Back pain. Abdominal pain. CT scan demonstrates destruction of the L1 vertebral body. History of benign-appearing compression fracture in January 21. EXAM: MRI THORACIC WITHOUT AND WITH CONTRAST TECHNIQUE: Multiplanar and multiecho pulse sequences of the thoracic spine were obtained without and with intravenous contrast. CONTRAST:  89m GADAVIST GADOBUTROL 1 MMOL/ML IV SOLN COMPARISON:  MRI of the lumbar spine dated 01/20/2020 and CT scans dated 01/20/2020 and 11/11/2019 FINDINGS: MRI THORACIC SPINE FINDINGS Alignment:  Physiologic. Vertebrae: Severe compression fracture of L1 with abnormal signal from the T12 vertebral body with erosion/destruction of a portion of the inferior endplate of TJ82 Edema and enhancement around the facet joints at T12-L1. The remainder of the thoracic spine appears normal. Cord: The tip of the conus is at T12-L1. There is protrusion of bone and soft tissue into the spinal canal at T12-L1 with moderately severe compression of the thecal sac. Paraspinal and other soft tissues: Paraspinal phlegmon extending into the psoas muscles on the right and left with abnormal edema and enhancement of those soft tissues as well as extending into the anterior epidural space at T12-L1 and L1-2. Disc levels: The discs from C7-T1 through T11-12 are normal. T12-L1: There is no disc bulging or protrusion. There is slight edema in the expanded disc space but there is no abnormal enhancement of the disc after contrast administration. There is erosion/destruction of a small segment of the inferior endplate of TN05with destruction of the L1 vertebral body with protrusion of bone and soft tissue into the anterior epidural space as described above. Paraspinal phlegmon. IMPRESSION: 1. Severe compression fracture of T12 with protrusion of bone and soft tissue into the spinal canal with moderately severe  compression of the thecal sac. I suspect this represents an atypical infection such as tuberculosis or actinomycosis. B-cell lymphoma should be considered. The patient is afebrile and has a normal white blood count at this time. 2. Paraspinal phlegmon at T12-L1 and L1-2. Electronically Signed   By: JLorriane ShireM.D.   On: 01/20/2020 17:50   MR Lumbar Spine W Wo Contrast  Result Date: 01/20/2020 CLINICAL DATA:  Progressive compression fracture of L1. CT scan of the abdomen dated 01/20/2020 suggests the possibility of discitis/osteomyelitis. EXAM: MRI LUMBAR SPINE WITHOUT AND WITH CONTRAST TECHNIQUE: Multiplanar and multiecho pulse sequences of the lumbar spine were obtained without and with intravenous contrast. CONTRAST:  847mGADAVIST GADOBUTROL 1 MMOL/ML IV SOLN COMPARISON:  CT scan dated 01/20/2020 and 11/15/2019 FINDINGS: Segmentation:  Standard. Alignment: Severe compression fracture of L1 with protrusion of the posterior margin of the into the spinal canal approximately 6 mm. Vertebrae: There is a severe compression fracture of L1 with fragmentation of the vertebral body. There are new erosions of the inferior endplate of T1L97nd of the superior endplate of L2 with abnormal enhancement of those vertebra as well as abnormal enhancement of the remnants of the L1 vertebral body. There is abnormal soft tissue surrounding the vertebral bodies from T12 through L2 with abnormal enhancement after contrast administration. The process involves the adjacent psoas muscles. There is also protrusion of bone and soft tissue into the spinal canal. There is enhancement of the protruding bone and soft tissue with moderately severe compression of the thecal sac. There is no significant abnormality of the discs at T12-L1 and L1-2. There is slight enhancement around the facet  joints at T12-L1 and L1-2. Conus medullaris: Conus extends to the T12-L1 level. Paraspinal and other soft tissues: There is abnormal edema and  enhancement of the psoas muscles from T12 through L2-3 bilaterally. No discrete paraspinal abscess. There are some bone fragments in the paraspinal soft tissues within the right psoas muscle at T12-L1 level. Disc levels: T11-12: Normal. T12-L1: No disc bulging or disc protrusion. Enhancing epidural soft tissue extends into the spinal canal and compresses the thecal sac without a definable epidural abscess. Destruction of a portion of the inferior endplate of M08. Posterior margin of the fractured and destroyed L1 vertebral body protrudes 6 mm into the spinal canal. L1-2: The disc appears normal. Abnormal enhancement of the L1 vertebral body fragments and of the superior endplate of L2 destruction of a portion of the superior endplate. Abnormal enhancing soft tissue is seen anterior to the thecal sac at L1-2 with slight symmetrical compression of the thecal sac at that level. L2-3: Tiny disc bulges into the neural foramina without neural impingement. Otherwise negative. L3-4: Small broad-based disc bulge with symmetrical slight compression of the thecal sac. The bulges extend into both neural foramina without focal neural impingement. L4-5: Small broad-based disc bulge asymmetric into the right neural foramen, touching right L4 nerve within the neural foramen. L5-S1: Small central disc bulge without neural impingement. Otherwise negative. IMPRESSION: Destruction of the L1 vertebral body with partial destruction of the inferior endplates of Y22 and L2 with abnormal enhancing paraspinal and epidural soft tissues at that level. No definable abscess. I suspect this represents atypical infection such as TB or actinomycosis. B-cell lymphoma can also give this appearance. There is moderate compression of the thecal sac at L1 as described above. The patient has a history of osteomyelitis and MRSA. However, the patient's white blood count and temperature are normal at this time. Electronically Signed   By: Lorriane Shire M.D.    On: 01/20/2020 17:44    Assessment/Plan: 64 yo F with likely severe osteomyelitis of L1 - I had a long discussion with the patient and husband.  Given her spinal destructive changes with erosion of essentially all of L1, she will require spinal stabilization.  I did explain that surgery is to address her spinal stabilization and will not eliminate her infection, and she may need lifelong antibiotics given the need for spinal instrumentation.  Additionally, with involvement of her T12 inferior and L2 superior vertebral bodies, reconstruction will be difficult and tenuous at best, with high chance of eventual breakdown. Nevertheless, it is likely better than the alternative of no surgery which will essentially leave her nonambulatory.  They wish to proceed - continue bedrest -preop for tomorrow   Vallarie Mare 01/21/2020, 2:24 PM

## 2020-01-21 NOTE — Progress Notes (Addendum)
Initial Nutrition Assessment  DOCUMENTATION CODES:   Obesity unspecified  INTERVENTION:   -MVI with minerals daily -Glucerna Shake po TID, each supplement provides 220 kcal and 10 grams of protein  NUTRITION DIAGNOSIS:   Increased nutrient needs related to post-op healing as evidenced by estimated needs.  GOAL:   Patient will meet greater than or equal to 90% of their needs  MONITOR:   PO intake, Supplement acceptance, Labs, Weight trends, Skin, I & O's  REASON FOR ASSESSMENT:   Malnutrition Screening Tool    ASSESSMENT:   [name] [name] is a 64 y.o. adult with history of diabetes mellitus type 2, chronic kidney disease stage III anemia who was admitted in January 2021 for septic arthritis of the elbow at the time patient also had a L1 compression fracture has been having increasing pain in the lower over the last 2 weeks which has progressively worsened to the point that patient is not ambulating at this time.  Denies any weakness of the extremities or incontinence of urine or bowel.  Patient has not moved bowels for last 2 weeks.  Denies vomiting.  Has been eating her food but decreased intake.  Has been using all her medications including insulin.  Pt admitted with lumbar discitis.   Reviewed I/O's: +381 ml x 24 hours   UOP: 700 ml x 24 hours  Per neurosurgery notes, pt will require spinal stabilization with possible L1 corpectomy.   Pt sleeping soundly at visit and did not respond to voice. Observed breakfast tray at bedside, which was unattempted. Per H&P, pt appetite has been decreased over the past 2 weeks.   Reviewed wt hx; noted wt has been stable over the past year.   Lab Results  Component Value Date   HGBA1C 12.5 (H) 11/16/2019  PTA DM medications are 1000 mg metformin BID. 13 units insulin lispro daily, 20 units insulin glargine q AM, and 40 units insulin glargine q PM.   Labs reviewed: CBGS 132-176 (inpatient orders for glycemic control are 0-9 units  insulin aspart TID with meals, 20 units insulin glargine daily, and 30 units insulin glargine daily).   NUTRITION - FOCUSED PHYSICAL EXAM:    Most Recent Value  Orbital Region  No depletion  Upper Arm Region  No depletion  Thoracic and Lumbar Region  No depletion  Buccal Region  No depletion  Temple Region  No depletion  Clavicle Bone Region  No depletion  Clavicle and Acromion Bone Region  No depletion  Scapular Bone Region  No depletion  Dorsal Hand  No depletion  Patellar Region  No depletion  Anterior Thigh Region  No depletion  Posterior Calf Region  No depletion  Edema (RD Assessment)  Mild  Hair  Reviewed  Eyes  Reviewed  Mouth  Reviewed  Skin  Reviewed  Nails  Reviewed       Diet Order:   Diet Order            Diet heart healthy/carb modified Room service appropriate? Yes; Fluid consistency: Thin  Diet effective now              EDUCATION NEEDS:   No education needs have been identified at this time  Skin:  Skin Assessment: Skin Integrity Issues: Skin Integrity Issues:: Other (Comment), Diabetic Ulcer Diabetic Ulcer: lt heel Other: MASD breasts  Last BM:  01/21/20  Height:   Ht Readings from Last 1 Encounters:  01/20/20 5' 1"  (1.549 m)    Weight:   Wt  Readings from Last 1 Encounters:  01/20/20 81.7 kg    Ideal Body Weight:  47.7 kg  BMI:  Body mass index is 34.02 kg/m.  Estimated Nutritional Needs:   Kcal:  1700-1900  Protein:  95-110 grams  Fluid:  > 1.7 L    Loistine Chance, RD, LDN, [place] Registered Dietitian II Certified Diabetes Care and Education Specialist Please refer to [facility] for RD and/or RD on-call/weekend/after hours pager

## 2020-01-21 NOTE — Plan of Care (Signed)

## 2020-01-21 NOTE — Progress Notes (Addendum)
Patient was able to have a bowel movement overnight. Patient able to void this morning on her own, urinalysis sent to lab.  EKG performed, copy placed in chart

## 2020-01-21 NOTE — Progress Notes (Signed)
PROGRESS NOTE    [name] [name]  JJK:[id number] DOB: 05-31-1956 DOA: 01/20/2020 PCP: Ann Held, DO    Brief Narrative:  [id number] with hx DM2, CKD3, hx recent septic elbow as well as L1 compression fx presents with worsening back pain, found to have evidence concerning of L1 osteomyelitis. Neurosurgery following.  Assessment & Plan:   Principal Problem:   Osteomyelitis of vertebra of thoracolumbar region [facility]) Active Problems:   Essential hypertension   Hepatic cirrhosis (HCC)   CAD (coronary artery disease)   CKD (chronic kidney disease), stage III (HCC)   Ulcer of left heel, limited to breakdown of skin ([place])   Type 2 diabetes mellitus with hyperglycemia, with long-term current use of insulin (HCC)   Septic arthritis of elbow, right (HCC)   Paraspinal mass  1. Lumbar discitis involving the L1 vertebra with paraspinal fluid enhancement 1. Neurosurgery following, appreciate input 2. Pending surgery 3/25 3. ID following, recommendation to continue to hold abx until surgical pathology can be obtained 4. Afebrile at this time 5. Pt reports marked back pain, unable to sit up to eat breakfast this AM. Will increase IV dilaudid frequency to q3hrs 2. Constipation 1. Cathartics ordered 2. Stool output documented 3. Diabetes mellitus type 2 uncontrolled 1. Continued on twice daily Lantus insulin with sliding scale coverage. 4. Chronic kidney disease stage III 1. Cr currently 1.55 2. Labs reviewed, renal function stable compared to prior 5. Chronic anemia with mild worsening of hemoglobin 1. Hgb currently 8.8, slowly trending down 2. Hemodynamically stable at this time 3. Repeat CBC in AM 6. CAD 1. Currently without chest pain 7. History of cirrhosis of the liver 1. LFT's reviewed, stable at this time 2. Repeat LFT's in AM 8. Renal transplant as per the history but CAT scan does not show any transplanted kidney.  DVT prophylaxis: SCD's Code Status: Full Family  Communication: Pt in room, family at bedside Disposition Plan: From home, dispo pending per PT/OT recs after back surgery 3/25. Also still needs IV narcotic for pain control. Would d/c when cleared by neurosurgery and ID  Consultants:   ID  Neurosurgery  Procedures:     Antimicrobials: Anti-infectives (From admission, onward)   None       Subjective: Complaining of marked low back pain, unable to even sit up to eat breakfast this AM  Objective: Vitals:   01/20/20 2313 01/21/20 0445 01/21/20 0740 01/21/20 1440  BP: (!) 151/74 (!) 124/58 135/75 (!) 119/58  Pulse: 91 88 85 92  Resp: 18 16 18 16   Temp: 98.5 F (36.9 C) 98.6 F (37 C) 98.4 F (36.9 C) 98 F (36.7 C)  TempSrc: Oral Oral Oral Oral  SpO2: 99% 93% 95% 96%  Weight:      Height:        Intake/Output Summary (Last 24 hours) at 01/21/2020 1619 Last data filed at 01/21/2020 1300 Gross per 24 hour  Intake 360 ml  Output 701 ml  Net -341 ml   Filed Weights   01/20/20 1051 01/20/20 2300  Weight: 81.6 kg 81.7 kg    Examination:  General exam: Appears calm and uncomfortable Respiratory system: Clear to auscultation. Respiratory effort normal. Cardiovascular system: S1 & S2 heard, Regular Gastrointestinal system: Abdomen is nondistended, soft and nontender. No organomegaly or masses felt. Normal bowel sounds heard. Central nervous system: Alert and oriented. No focal neurological deficits. Extremities: Symmetric 5 x 5 power. Skin: No rashes, lesions Psychiatry: Judgement and insight appear normal. Mood & affect  appropriate.   Data Reviewed: I have personally reviewed following labs and imaging studies  CBC: Recent Labs  Lab 01/20/20 1056 01/21/20 0619  WBC 7.6 7.7  NEUTROABS  --  5.2  HGB 9.8* 8.8*  HCT 32.2* 29.1*  MCV 84.3 84.6  PLT 231 350   Basic Metabolic Panel: Recent Labs  Lab 01/20/20 1056 01/21/20 0619  NA 136 136  K 4.2 4.3  CL 99 102  CO2 28 26  GLUCOSE 298* 163*  BUN 13 14    CREATININE 1.35* 1.55*  CALCIUM 9.0 8.7*   GFR: Estimated Creatinine Clearance (by C-G formula based on SCr of 1.55 mg/dL (H)) Female: 35.5 mL/min (A) Female: 43.7 mL/min (A) Liver Function Tests: Recent Labs  Lab 01/20/20 1056 01/21/20 0619  AST 9* 10*  ALT 9 8  ALKPHOS 99 95  BILITOT 0.8 0.6  PROT 7.7 6.9  ALBUMIN 2.3* 2.1*   Recent Labs  Lab 01/20/20 1056  LIPASE 37   No results for input(s): AMMONIA in the last 168 hours. Coagulation Profile: No results for input(s): INR, PROTIME in the last 168 hours. Cardiac Enzymes: No results for input(s): CKTOTAL, CKMB, CKMBINDEX, TROPONINI in the last 168 hours. BNP (last 3 results) No results for input(s): PROBNP in the last 8760 hours. HbA1C: No results for input(s): HGBA1C in the last 72 hours. CBG: Recent Labs  Lab 01/21/20 0103 01/21/20 0721 01/21/20 1124  GLUCAP 176* 132* 105*   Lipid Profile: No results for input(s): CHOL, HDL, LDLCALC, TRIG, CHOLHDL, LDLDIRECT in the last 72 hours. Thyroid Function Tests: No results for input(s): TSH, T4TOTAL, FREET4, T3FREE, THYROIDAB in the last 72 hours. Anemia Panel: No results for input(s): VITAMINB12, FOLATE, FERRITIN, TIBC, IRON, RETICCTPCT in the last 72 hours. Sepsis Labs: No results for input(s): PROCALCITON, LATICACIDVEN in the last 168 hours.  Recent Results (from the past 240 hour(s))  Blood culture (routine x 2)     Status: None (Preliminary result)   Collection Time: 01/20/20  1:34 PM   Specimen: BLOOD RIGHT HAND  Result Value Ref Range Status   Specimen Description BLOOD RIGHT HAND  Final   Special Requests   Final    BOTTLES DRAWN AEROBIC ONLY Blood Culture results may not be optimal due to an inadequate volume of blood received in culture bottles   Culture   Final    NO GROWTH < 24 HOURS Performed at [facility], [place] [address], [place], [place] [address]    Report Status PENDING  Incomplete  Blood culture (routine x 2)     Status: None  (Preliminary result)   Collection Time: 01/20/20  3:28 PM   Specimen: BLOOD  Result Value Ref Range Status   Specimen Description BLOOD RIGHT ANTECUBITAL  Final   Special Requests   Final    BOTTLES DRAWN AEROBIC AND ANAEROBIC Blood Culture adequate volume   Culture   Final    NO GROWTH < 24 HOURS Performed at [facility], [place] [address] [place], [place] [address]    Report Status PENDING  Incomplete  SARS CORONAVIRUS 2 (TAT 6-24 HRS) Nasopharyngeal Nasopharyngeal Swab     Status: None   Collection Time: 01/20/20  3:37 PM   Specimen: Nasopharyngeal Swab  Result Value Ref Range Status   SARS Coronavirus 2 NEGATIVE NEGATIVE Final    Comment: (NOTE) SARS-CoV-2 target nucleic acids are NOT DETECTED. The SARS-CoV-2 RNA is generally detectable in upper and lower respiratory specimens during the acute phase of  infection. Negative results do not preclude SARS-CoV-2 infection, do not rule out co-infections with other pathogens, and should not be used as the sole basis for treatment or other patient management decisions. Negative results must be combined with clinical observations, patient history, and epidemiological information. The expected result is Negative. Fact Sheet for Patients: SugarRoll.be Fact Sheet for Healthcare Providers: https://www.woods-mathews.com/ This test is not yet approved or cleared by the [place] FDA and  has been authorized for detection and/or diagnosis of SARS-CoV-2 by FDA under an Emergency Use Authorization (EUA). This EUA will remain  in effect (meaning this test can be used) for the duration of the COVID-19 declaration under Section 56 4(b)(1) of the Act, 21 U.S.C. section 360bbb-3(b)(1), unless the authorization is terminated or revoked sooner. Performed at [facility], [place] [address] [place], [place] [address]   MRSA PCR Screening     Status: None   Collection Time: 01/21/20  5:25 AM    Specimen: Nasal Mucosa; Nasopharyngeal  Result Value Ref Range Status   MRSA by PCR NEGATIVE NEGATIVE Final    Comment:        The GeneXpert MRSA Assay (FDA approved for NASAL specimens only), is one component of a comprehensive MRSA colonization surveillance program. It is not intended to diagnose MRSA infection nor to guide or monitor treatment for MRSA infections. Performed at [facility], [place] [address], [place], [place] [address]      Radiology Studies: CT ABDOMEN PELVIS WO CONTRAST  Result Date: 01/20/2020 CLINICAL DATA:  Lower abdominal pain, constipation EXAM: CT ABDOMEN AND PELVIS WITHOUT CONTRAST TECHNIQUE: Multidetector CT imaging of the abdomen and pelvis was performed following the standard protocol without IV contrast. COMPARISON:  11/15/2019 FINDINGS: Lower chest: No acute pleural or parenchymal lung disease. Hepatobiliary: Nodular contour of the liver compatible with cirrhosis, stable. Calcified granulomata are seen within the liver parenchyma. The gallbladder is surgically absent. Pancreas: Unremarkable. No pancreatic ductal dilatation or surrounding inflammatory changes. Spleen: Normal in size without focal abnormality. Adrenals/Urinary Tract: No urinary tract calculi or obstructive uropathy. The bladder is unremarkable. The adrenals are normal. Stomach/Bowel: No bowel obstruction or ileus. Moderate fecal retention. Normal appendix right lower quadrant. No bowel wall thickening or inflammatory changes. Vascular/Lymphatic: Multiple subcentimeter lymph nodes are seen within the retroperitoneum, increased in number since prior study. No pathologically enlarged lymph nodes. Vascular calcifications are again seen throughout the aorta and its branches. Reproductive: Status post hysterectomy. No adnexal masses. Other: There is no free fluid or free gas within the abdomen or pelvis. Musculoskeletal: Since the prior exam, there is been progression of the compression deformity at  L1, with marked bony resorption and development of vertebral plana. There is also cortical erosion within the superior endplate of L2 and inferior endplate of I68. Overall, the appearance is highly concerning for progressive discitis/osteomyelitis. There is mild retropulsion of the L1 vertebral body with at least 50% narrowing of the central canal at the L1 level. MRI is recommended for further evaluation and to better assess to the mass effect upon the central canal and neural foramina. There is no obvious fluid collection or abscess. Effacement of the fat planes between the vertebral bodies and paraspinous musculature. IMPRESSION: 1. Interval development of L1 vertebral plana with marked bony resorption of the L1 vertebral body as well as erosive changes of the inferior T12 endplate and superior L2 endplate. Overall, the appearance and rapid progression is concerning for underlying discitis/osteomyelitis. 2. Slight retropulsion and phlegmon at the  L1 level results in at least 50% narrowing of the central canal. MRI is recommended for further evaluation. 3. No evidence of paraspinal abscess on this unenhanced exam. There is effacement of the fat planes between the upper lumbar spine and paraspinous musculature. Again, MRI would be useful for further evaluation. 4. Moderate fecal retention. 5. Cirrhosis. These results were called by telephone at the time of interpretation on 01/20/2020 at 3:13 pm to provider [facility] , who verbally acknowledged these results. Electronically Signed   By: Randa Ngo M.D.   On: 01/20/2020 15:13   MR THORACIC SPINE W WO CONTRAST  Result Date: 01/20/2020 CLINICAL DATA:  Back pain. Abdominal pain. CT scan demonstrates destruction of the L1 vertebral body. History of benign-appearing compression fracture in January 21. EXAM: MRI THORACIC WITHOUT AND WITH CONTRAST TECHNIQUE: Multiplanar and multiecho pulse sequences of the thoracic spine were obtained without and with  intravenous contrast. CONTRAST:  91m GADAVIST GADOBUTROL 1 MMOL/ML IV SOLN COMPARISON:  MRI of the lumbar spine dated 01/20/2020 and CT scans dated 01/20/2020 and 11/11/2019 FINDINGS: MRI THORACIC SPINE FINDINGS Alignment:  Physiologic. Vertebrae: Severe compression fracture of L1 with abnormal signal from the T12 vertebral body with erosion/destruction of a portion of the inferior endplate of TJ85 Edema and enhancement around the facet joints at T12-L1. The remainder of the thoracic spine appears normal. Cord: The tip of the conus is at T12-L1. There is protrusion of bone and soft tissue into the spinal canal at T12-L1 with moderately severe compression of the thecal sac. Paraspinal and other soft tissues: Paraspinal phlegmon extending into the psoas muscles on the right and left with abnormal edema and enhancement of those soft tissues as well as extending into the anterior epidural space at T12-L1 and L1-2. Disc levels: The discs from C7-T1 through T11-12 are normal. T12-L1: There is no disc bulging or protrusion. There is slight edema in the expanded disc space but there is no abnormal enhancement of the disc after contrast administration. There is erosion/destruction of a small segment of the inferior endplate of TU31with destruction of the L1 vertebral body with protrusion of bone and soft tissue into the anterior epidural space as described above. Paraspinal phlegmon. IMPRESSION: 1. Severe compression fracture of T12 with protrusion of bone and soft tissue into the spinal canal with moderately severe compression of the thecal sac. I suspect this represents an atypical infection such as tuberculosis or actinomycosis. B-cell lymphoma should be considered. The patient is afebrile and has a normal white blood count at this time. 2. Paraspinal phlegmon at T12-L1 and L1-2. Electronically Signed   By: JLorriane ShireM.D.   On: 01/20/2020 17:50   MR Lumbar Spine W Wo Contrast  Result Date: 01/20/2020 CLINICAL  DATA:  Progressive compression fracture of L1. CT scan of the abdomen dated 01/20/2020 suggests the possibility of discitis/osteomyelitis. EXAM: MRI LUMBAR SPINE WITHOUT AND WITH CONTRAST TECHNIQUE: Multiplanar and multiecho pulse sequences of the lumbar spine were obtained without and with intravenous contrast. CONTRAST:  821mGADAVIST GADOBUTROL 1 MMOL/ML IV SOLN COMPARISON:  CT scan dated 01/20/2020 and 11/15/2019 FINDINGS: Segmentation:  Standard. Alignment: Severe compression fracture of L1 with protrusion of the posterior margin of the into the spinal canal approximately 6 mm. Vertebrae: There is a severe compression fracture of L1 with fragmentation of the vertebral body. There are new erosions of the inferior endplate of T1S97nd of the superior endplate of L2 with abnormal enhancement of those vertebra as well as abnormal enhancement  of the remnants of the L1 vertebral body. There is abnormal soft tissue surrounding the vertebral bodies from T12 through L2 with abnormal enhancement after contrast administration. The process involves the adjacent psoas muscles. There is also protrusion of bone and soft tissue into the spinal canal. There is enhancement of the protruding bone and soft tissue with moderately severe compression of the thecal sac. There is no significant abnormality of the discs at T12-L1 and L1-2. There is slight enhancement around the facet joints at T12-L1 and L1-2. Conus medullaris: Conus extends to the T12-L1 level. Paraspinal and other soft tissues: There is abnormal edema and enhancement of the psoas muscles from T12 through L2-3 bilaterally. No discrete paraspinal abscess. There are some bone fragments in the paraspinal soft tissues within the right psoas muscle at T12-L1 level. Disc levels: T11-12: Normal. T12-L1: No disc bulging or disc protrusion. Enhancing epidural soft tissue extends into the spinal canal and compresses the thecal sac without a definable epidural abscess. Destruction  of a portion of the inferior endplate of G64. Posterior margin of the fractured and destroyed L1 vertebral body protrudes 6 mm into the spinal canal. L1-2: The disc appears normal. Abnormal enhancement of the L1 vertebral body fragments and of the superior endplate of L2 destruction of a portion of the superior endplate. Abnormal enhancing soft tissue is seen anterior to the thecal sac at L1-2 with slight symmetrical compression of the thecal sac at that level. L2-3: Tiny disc bulges into the neural foramina without neural impingement. Otherwise negative. L3-4: Small broad-based disc bulge with symmetrical slight compression of the thecal sac. The bulges extend into both neural foramina without focal neural impingement. L4-5: Small broad-based disc bulge asymmetric into the right neural foramen, touching right L4 nerve within the neural foramen. L5-S1: Small central disc bulge without neural impingement. Otherwise negative. IMPRESSION: Destruction of the L1 vertebral body with partial destruction of the inferior endplates of Q03 and L2 with abnormal enhancing paraspinal and epidural soft tissues at that level. No definable abscess. I suspect this represents atypical infection such as TB or actinomycosis. B-cell lymphoma can also give this appearance. There is moderate compression of the thecal sac at L1 as described above. The patient has a history of osteomyelitis and MRSA. However, the patient's white blood count and temperature are normal at this time. Electronically Signed   By: Lorriane Shire M.D.   On: 01/20/2020 17:44    Scheduled Meds: . atorvastatin  10 mg Oral Daily  . DULoxetine  60 mg Oral Daily  . feeding supplement (GLUCERNA SHAKE)  237 mL Oral TID BM  . insulin aspart  0-9 Units Subcutaneous TID WC  . insulin glargine  20 Units Subcutaneous Daily  . insulin glargine  30 Units Subcutaneous Q2200  . lidocaine  1 patch Transdermal Q24H  . multivitamin with minerals  1 tablet Oral Daily  .  pregabalin  150 mg Oral BID  . senna  1 tablet Oral Daily   Continuous Infusions:   LOS: 1 day   Marylu Lund, MD [facility] Pager On Amion  If 7PM-7AM, please contact night-coverage 01/21/2020, 4:19 PM

## 2020-01-22 ENCOUNTER — Inpatient Hospital Stay (HOSPITAL_COMMUNITY): Payer: PRIVATE HEALTH INSURANCE | Admitting: Anesthesiology

## 2020-01-22 ENCOUNTER — Inpatient Hospital Stay (HOSPITAL_COMMUNITY): Payer: PRIVATE HEALTH INSURANCE

## 2020-01-22 ENCOUNTER — Encounter (HOSPITAL_COMMUNITY): Payer: Self-pay | Admitting: Internal Medicine

## 2020-01-22 ENCOUNTER — Encounter (HOSPITAL_COMMUNITY)
Admission: EM | Disposition: A | Discharge status: Rehabilitation Facility | Payer: Self-pay | Source: Home / Self Care | Attending: Internal Medicine

## 2020-01-22 ENCOUNTER — Encounter: Admit: 2020-01-22 | Discharge: 2020-01-22 | Payer: MEDICARE

## 2020-01-22 DIAGNOSIS — M48061 Spinal stenosis, lumbar region without neurogenic claudication: Secondary | ICD-10-CM | POA: Diagnosis present

## 2020-01-22 DIAGNOSIS — I2510 Atherosclerotic heart disease of native coronary artery without angina pectoris: Secondary | ICD-10-CM

## 2020-01-22 DIAGNOSIS — K7030 Alcoholic cirrhosis of liver without ascites: Secondary | ICD-10-CM

## 2020-01-22 DIAGNOSIS — Z981 Arthrodesis status: Secondary | ICD-10-CM

## 2020-01-22 HISTORY — PX: ANTERIOR LAT LUMBAR FUSION: SHX1168

## 2020-01-22 HISTORY — PX: POSTERIOR LUMBAR FUSION 4 LEVEL: SHX6037

## 2020-01-22 HISTORY — PX: APPLICATION OF ROBOTIC ASSISTANCE FOR SPINAL PROCEDURE: SHX6753

## 2020-01-22 LAB — CBC
HCT: 29.1 % — ABNORMAL LOW (ref 36.0–46.0)
Hemoglobin: 8.8 g/dL — ABNORMAL LOW (ref 12.0–15.0)
MCH: 25.4 pg — ABNORMAL LOW (ref 26.0–34.0)
MCHC: 30.2 g/dL (ref 30.0–36.0)
MCV: 83.9 fL (ref 80.0–100.0)
Platelets: 209 10*3/uL (ref 150–400)
RBC: 3.47 MIL/uL — ABNORMAL LOW (ref 3.87–5.11)
RDW: 14.5 % (ref 11.5–15.5)
WBC: 6.5 10*3/uL (ref 4.0–10.5)
nRBC: 0 % (ref 0.0–0.2)

## 2020-01-22 LAB — APTT: aPTT: 31 seconds (ref 24–36)

## 2020-01-22 LAB — COMPREHENSIVE METABOLIC PANEL
ALT: 6 U/L (ref 0–44)
AST: 10 U/L — ABNORMAL LOW (ref 15–41)
Albumin: 2 g/dL — ABNORMAL LOW (ref 3.5–5.0)
Alkaline Phosphatase: 90 U/L (ref 38–126)
Anion gap: 8 (ref 5–15)
BUN: 19 mg/dL (ref 8–23)
CO2: 28 mmol/L (ref 22–32)
Calcium: 8.9 mg/dL (ref 8.9–10.3)
Chloride: 103 mmol/L (ref 98–111)
Creatinine, Ser: 1.58 mg/dL — ABNORMAL HIGH (ref 0.44–1.00)
GFR calc Af Amer: 40 mL/min — ABNORMAL LOW (ref >60)
GFR calc non Af Amer: 34 mL/min — ABNORMAL LOW (ref >60)
Glucose, Bld: 127 mg/dL — ABNORMAL HIGH (ref 70–99)
Potassium: 4.5 mmol/L (ref 3.5–5.1)
Sodium: 139 mmol/L (ref 135–145)
Total Bilirubin: 0.7 mg/dL (ref 0.3–1.2)
Total Protein: 6.8 g/dL (ref 6.5–8.1)

## 2020-01-22 LAB — SURGICAL PCR SCREEN
MRSA, PCR: NEGATIVE
Staphylococcus aureus: POSITIVE — AB

## 2020-01-22 LAB — PROTIME-INR
INR: 1.2 (ref 0.8–1.2)
Prothrombin Time: 14.6 seconds (ref 11.4–15.2)

## 2020-01-22 LAB — GLUCOSE, CAPILLARY
Glucose-Capillary: 112 mg/dL — ABNORMAL HIGH (ref 70–99)
Glucose-Capillary: 131 mg/dL — ABNORMAL HIGH (ref 70–99)
Glucose-Capillary: 186 mg/dL — ABNORMAL HIGH (ref 70–99)

## 2020-01-22 LAB — PREPARE RBC (CROSSMATCH)

## 2020-01-22 SURGERY — ANTERIOR LATERAL LUMBAR FUSION 3 LEVELS
Anesthesia: General | Site: Spine Lumbar

## 2020-01-22 MED ORDER — FLUTICASONE PROPIONATE 50 MCG/ACTUATION NA SPSN
NASAL | 3 refills | 60.00000 days | Status: AC
Start: 2020-01-22 — End: ?

## 2020-01-22 MED ORDER — ALBUTEROL SULFATE 90 MCG/ACTUATION IN HFAA
2 | RESPIRATORY_TRACT | 3 refills | Status: DC | PRN
Start: 2020-01-22 — End: 2020-03-03

## 2020-01-22 MED ORDER — SODIUM CHLORIDE 0.9 % IV SOLN
INTRAVENOUS | Status: DC | PRN
  Administered 2020-01-22: 500 mL

## 2020-01-22 MED ORDER — PROMETHAZINE HCL 25 MG/ML IJ SOLN: 6.2500 mg | INTRAMUSCULAR | Status: DC | PRN

## 2020-01-22 MED ORDER — DIAZEPAM 5 MG PO TABS
5.0000 mg | ORAL_TABLET | Freq: Four times a day (QID) | ORAL | Status: DC | PRN
  Administered 2020-01-23 – 2020-01-29 (×3): 5 mg via ORAL
  Filled 2020-01-22 (×3): qty 1

## 2020-01-22 MED ORDER — VANCOMYCIN HCL IN DEXTROSE 1-5 GM/200ML-% IV SOLN
1000.0000 mg | INTRAVENOUS | Status: AC
  Administered 2020-01-22: 1000 mg via INTRAVENOUS
  Filled 2020-01-22: qty 200

## 2020-01-22 MED ORDER — SODIUM CHLORIDE 0.9 % IV SOLN: INTRAVENOUS | Status: DC | PRN

## 2020-01-22 MED ORDER — LEVOFLOXACIN IN D5W 250 MG/50ML IV SOLN
250.0000 mg | INTRAVENOUS | Status: DC
  Administered 2020-01-23: 250 mg via INTRAVENOUS
  Filled 2020-01-22: qty 50

## 2020-01-22 MED ORDER — BUPIVACAINE HCL (PF) 0.5 % IJ SOLN
INTRAMUSCULAR | Status: DC | PRN
  Administered 2020-01-22: 8 mL
  Administered 2020-01-22: 4.5 mL

## 2020-01-22 MED ORDER — ONDANSETRON HCL 4 MG/2ML IJ SOLN: 4.0000 mg | Freq: Four times a day (QID) | INTRAMUSCULAR | Status: DC | PRN

## 2020-01-22 MED ORDER — SUFENTANIL CITRATE 250 MCG/5ML IV SOLN
INTRAVENOUS | Status: DC | PRN
  Administered 2020-01-22: .2 ug/kg/h via INTRAVENOUS

## 2020-01-22 MED ORDER — SODIUM CHLORIDE (PF) 0.9 % IJ SOLN
INTRAMUSCULAR | Status: AC
  Filled 2020-01-22: qty 10

## 2020-01-22 MED ORDER — FENTANYL CITRATE (PF) 250 MCG/5ML IJ SOLN
INTRAMUSCULAR | Status: AC
  Filled 2020-01-22: qty 5

## 2020-01-22 MED ORDER — MUPIROCIN 2 % EX OINT: 1.0000 "application " | TOPICAL_OINTMENT | Freq: Two times a day (BID) | CUTANEOUS | Status: DC

## 2020-01-22 MED ORDER — PHENOL 1.4 % MT LIQD: 1.0000 | OROMUCOSAL | Status: DC | PRN

## 2020-01-22 MED ORDER — MENTHOL 3 MG MT LOZG: 1.0000 | LOZENGE | OROMUCOSAL | Status: DC | PRN

## 2020-01-22 MED ORDER — DEXAMETHASONE SODIUM PHOSPHATE 10 MG/ML IJ SOLN
INTRAMUSCULAR | Status: DC | PRN
  Administered 2020-01-22: 10 mg via INTRAVENOUS

## 2020-01-22 MED ORDER — OXYCODONE HCL 5 MG PO TABS
10.0000 mg | ORAL_TABLET | ORAL | Status: DC | PRN
  Administered 2020-01-23 – 2020-01-29 (×22): 10 mg via ORAL
  Filled 2020-01-22 (×21): qty 2

## 2020-01-22 MED ORDER — MIDAZOLAM HCL 2 MG/2ML IJ SOLN
INTRAMUSCULAR | Status: AC
  Administered 2020-01-22: 2 mg via INTRAVENOUS
  Filled 2020-01-22: qty 2

## 2020-01-22 MED ORDER — SODIUM CHLORIDE 0.9% FLUSH
3.0000 mL | Freq: Two times a day (BID) | INTRAVENOUS | Status: DC
  Administered 2020-01-22 – 2020-01-29 (×11): 3 mL via INTRAVENOUS

## 2020-01-22 MED ORDER — OXYCODONE HCL 5 MG PO TABS: 5.0000 mg | ORAL_TABLET | Freq: Once | ORAL | Status: DC | PRN

## 2020-01-22 MED ORDER — LACTATED RINGERS IV SOLN: INTRAVENOUS | Status: DC | PRN

## 2020-01-22 MED ORDER — SUFENTANIL CITRATE 250 MCG/5ML IV SOLN
0.2500 ug/kg/h | INTRAVENOUS | Status: AC
  Filled 2020-01-22: qty 5

## 2020-01-22 MED ORDER — OXYCODONE HCL 5 MG/5ML PO SOLN: 5.0000 mg | Freq: Once | ORAL | Status: DC | PRN

## 2020-01-22 MED ORDER — HEMOSTATIC AGENTS (NO CHARGE) OPTIME
TOPICAL | Status: DC | PRN
  Administered 2020-01-22: 1 via TOPICAL

## 2020-01-22 MED ORDER — BISACODYL 10 MG RE SUPP: 10.0000 mg | Freq: Every day | RECTAL | Status: DC | PRN

## 2020-01-22 MED ORDER — ENOXAPARIN SODIUM 40 MG/0.4ML ~~LOC~~ SOLN
40.0000 mg | SUBCUTANEOUS | Status: DC
  Administered 2020-01-23 – 2020-01-29 (×7): 40 mg via SUBCUTANEOUS
  Filled 2020-01-22 (×7): qty 0.4

## 2020-01-22 MED ORDER — FENTANYL CITRATE (PF) 100 MCG/2ML IJ SOLN: 50.0000 ug | Freq: Once | INTRAMUSCULAR | Status: AC

## 2020-01-22 MED ORDER — THROMBIN 5000 UNITS EX SOLR
CUTANEOUS | Status: AC
  Filled 2020-01-22: qty 5000

## 2020-01-22 MED ORDER — THROMBIN 5000 UNITS EX SOLR
OROMUCOSAL | Status: DC | PRN
  Administered 2020-01-22 (×2): 5 mL via TOPICAL

## 2020-01-22 MED ORDER — DOCUSATE SODIUM 100 MG PO CAPS
100.0000 mg | ORAL_CAPSULE | Freq: Two times a day (BID) | ORAL | Status: DC
  Administered 2020-01-23 – 2020-01-29 (×13): 100 mg via ORAL
  Filled 2020-01-22 (×13): qty 1

## 2020-01-22 MED ORDER — HYDROMORPHONE HCL 1 MG/ML IJ SOLN
0.2500 mg | INTRAMUSCULAR | Status: DC | PRN
  Administered 2020-01-22: 0.5 mg via INTRAVENOUS

## 2020-01-22 MED ORDER — ALBUMIN HUMAN 5 % IV SOLN: INTRAVENOUS | Status: DC | PRN

## 2020-01-22 MED ORDER — HYDRALAZINE HCL 20 MG/ML IJ SOLN: 10.0000 mg | Freq: Four times a day (QID) | INTRAMUSCULAR | Status: DC | PRN

## 2020-01-22 MED ORDER — PROPOFOL 10 MG/ML IV BOLUS
INTRAVENOUS | Status: DC | PRN
  Administered 2020-01-22: 90 mg via INTRAVENOUS

## 2020-01-22 MED ORDER — LEVOFLOXACIN IN D5W 750 MG/150ML IV SOLN
INTRAVENOUS | Status: DC | PRN
  Administered 2020-01-22: 750 mg via INTRAVENOUS

## 2020-01-22 MED ORDER — ROCURONIUM BROMIDE 10 MG/ML (PF) SYRINGE
PREFILLED_SYRINGE | INTRAVENOUS | Status: DC | PRN
  Administered 2020-01-22: 100 mg via INTRAVENOUS
  Administered 2020-01-22: 50 mg via INTRAVENOUS

## 2020-01-22 MED ORDER — LIDOCAINE 2% (20 MG/ML) 5 ML SYRINGE
INTRAMUSCULAR | Status: DC | PRN
  Administered 2020-01-22: 80 mg via INTRAVENOUS

## 2020-01-22 MED ORDER — FENTANYL CITRATE (PF) 100 MCG/2ML IJ SOLN
INTRAMUSCULAR | Status: DC | PRN
  Administered 2020-01-22: 100 ug via INTRAVENOUS
  Administered 2020-01-22: 50 ug via INTRAVENOUS
  Administered 2020-01-22: 100 ug via INTRAVENOUS
  Administered 2020-01-22: 50 ug via INTRAVENOUS
  Administered 2020-01-22: 100 ug via INTRAVENOUS
  Administered 2020-01-22: 50 ug via INTRAVENOUS

## 2020-01-22 MED ORDER — VANCOMYCIN HCL 500 MG/100ML IV SOLN
500.0000 mg | INTRAVENOUS | Status: DC
  Administered 2020-01-23: 500 mg via INTRAVENOUS
  Filled 2020-01-22 (×3): qty 100

## 2020-01-22 MED ORDER — BUPIVACAINE HCL (PF) 0.5 % IJ SOLN
INTRAMUSCULAR | Status: AC
  Filled 2020-01-22: qty 30

## 2020-01-22 MED ORDER — LIDOCAINE-EPINEPHRINE 1 %-1:100000 IJ SOLN
INTRAMUSCULAR | Status: AC
  Filled 2020-01-22: qty 1

## 2020-01-22 MED ORDER — POLYETHYLENE GLYCOL 3350 17 G PO PACK
17.0000 g | PACK | Freq: Every day | ORAL | Status: DC | PRN
  Administered 2020-01-25: 17 g via ORAL

## 2020-01-22 MED ORDER — SENNA 8.6 MG PO TABS
1.0000 | ORAL_TABLET | Freq: Two times a day (BID) | ORAL | Status: DC
  Administered 2020-01-23 – 2020-01-29 (×13): 8.6 mg via ORAL
  Filled 2020-01-22 (×13): qty 1

## 2020-01-22 MED ORDER — LIDOCAINE 2% (20 MG/ML) 5 ML SYRINGE
INTRAMUSCULAR | Status: AC
  Filled 2020-01-22: qty 5

## 2020-01-22 MED ORDER — HYDROMORPHONE HCL 1 MG/ML IJ SOLN
0.5000 mg | INTRAMUSCULAR | Status: DC | PRN
  Administered 2020-01-23 – 2020-01-28 (×5): 0.5 mg via INTRAVENOUS
  Filled 2020-01-22 (×6): qty 1

## 2020-01-22 MED ORDER — THROMBIN 5000 UNITS EX SOLR
CUTANEOUS | Status: AC
  Filled 2020-01-22: qty 10000

## 2020-01-22 MED ORDER — HYDROMORPHONE HCL 1 MG/ML IJ SOLN
INTRAMUSCULAR | Status: AC
  Filled 2020-01-22: qty 1

## 2020-01-22 MED ORDER — ONDANSETRON HCL 4 MG/2ML IJ SOLN
INTRAMUSCULAR | Status: DC | PRN
  Administered 2020-01-22: 4 mg via INTRAVENOUS

## 2020-01-22 MED ORDER — FENTANYL CITRATE (PF) 100 MCG/2ML IJ SOLN
INTRAMUSCULAR | Status: AC
  Administered 2020-01-22: 50 ug via INTRAVENOUS
  Filled 2020-01-22: qty 2

## 2020-01-22 MED ORDER — ONDANSETRON HCL 4 MG PO TABS: 4.0000 mg | ORAL_TABLET | Freq: Four times a day (QID) | ORAL | Status: DC | PRN

## 2020-01-22 MED ORDER — SUGAMMADEX SODIUM 200 MG/2ML IV SOLN
INTRAVENOUS | Status: DC | PRN
  Administered 2020-01-22: 200 mg via INTRAVENOUS

## 2020-01-22 MED ORDER — VANCOMYCIN HCL IN DEXTROSE 1-5 GM/200ML-% IV SOLN
1000.0000 mg | INTRAVENOUS | Status: AC
  Filled 2020-01-22: qty 200

## 2020-01-22 MED ORDER — POTASSIUM CHLORIDE IN NACL 20-0.9 MEQ/L-% IV SOLN
INTRAVENOUS | Status: DC
  Filled 2020-01-22 (×4): qty 1000

## 2020-01-22 MED ORDER — FENTANYL CITRATE (PF) 100 MCG/2ML IJ SOLN
50.0000 ug | Freq: Once | INTRAMUSCULAR | Status: AC
  Administered 2020-01-22: 50 ug via INTRAVENOUS

## 2020-01-22 MED ORDER — OXYCODONE HCL 5 MG PO TABS
5.0000 mg | ORAL_TABLET | ORAL | Status: DC | PRN
  Administered 2020-01-23 – 2020-01-29 (×3): 5 mg via ORAL
  Filled 2020-01-22 (×3): qty 1

## 2020-01-22 MED ORDER — ROCURONIUM BROMIDE 10 MG/ML (PF) SYRINGE
PREFILLED_SYRINGE | INTRAVENOUS | Status: AC
  Filled 2020-01-22: qty 10

## 2020-01-22 MED ORDER — LIDOCAINE-EPINEPHRINE 1 %-1:100000 IJ SOLN
INTRAMUSCULAR | Status: DC | PRN
  Administered 2020-01-22: 4.5 mL
  Administered 2020-01-22: 8 mL

## 2020-01-22 MED ORDER — THROMBIN 5000 UNITS EX SOLR
CUTANEOUS | Status: DC | PRN
  Administered 2020-01-22 (×2): 5000 [IU] via TOPICAL

## 2020-01-22 MED ORDER — FLEET ENEMA 7-19 GM/118ML RE ENEM: 1.0000 | ENEMA | Freq: Once | RECTAL | Status: DC | PRN

## 2020-01-22 MED ORDER — MIDAZOLAM HCL 2 MG/2ML IJ SOLN: 2.0000 mg | Freq: Once | INTRAMUSCULAR | Status: AC

## 2020-01-22 MED ORDER — PHENYLEPHRINE HCL-NACL 10-0.9 MG/250ML-% IV SOLN
INTRAVENOUS | Status: DC | PRN
  Administered 2020-01-22: 30 ug/min via INTRAVENOUS

## 2020-01-22 MED ORDER — SODIUM CHLORIDE 0.9% FLUSH: 3.0000 mL | INTRAVENOUS | Status: DC | PRN

## 2020-01-22 MED ORDER — AMLODIPINE BESYLATE 5 MG PO TABS
5.0000 mg | ORAL_TABLET | Freq: Every day | ORAL | Status: DC
  Administered 2020-01-23 – 2020-01-28 (×6): 5 mg via ORAL
  Filled 2020-01-22 (×6): qty 1

## 2020-01-22 MED ORDER — 0.9 % SODIUM CHLORIDE (POUR BTL) OPTIME
TOPICAL | Status: DC | PRN
  Administered 2020-01-22: 1000 mL

## 2020-01-22 MED ORDER — SODIUM CHLORIDE 0.9 % IV SOLN: 250.0000 mL | INTRAVENOUS | Status: DC

## 2020-01-22 SURGICAL SUPPLY — 110 items
BAG DECANTER FOR FLEXI CONT (MISCELLANEOUS) ×4 IMPLANT
BENZOIN TINCTURE PRP APPL 2/3 (GAUZE/BANDAGES/DRESSINGS) ×4 IMPLANT
BIT DRILL LONG 3.0X30 (BIT) IMPLANT
BIT DRILL LONG 3.0X30MM (BIT)
BIT DRILL LONG 3X80 (BIT) IMPLANT
BIT DRILL LONG 3X80MM (BIT)
BIT DRILL LONG 4X80 (BIT) IMPLANT
BIT DRILL LONG 4X80MM (BIT)
BIT DRILL SHORT 3.0X30 (BIT) IMPLANT
BIT DRILL SHORT 3.0X30MM (BIT)
BIT DRILL SHORT 3X80 (BIT) IMPLANT
BIT DRILL SHORT 3X80MM (BIT)
BLADE CLIPPER SURG (BLADE) IMPLANT
BLADE SURG 11 STRL SS (BLADE) ×4 IMPLANT
BUR MATCHSTICK NEURO 3.0 LAGG (BURR) ×4 IMPLANT
BUR PRECISION FLUTE 5.0 (BURR) ×4 IMPLANT
CAGE SPINAL CTR O20 H/27-38 (Cage) ×2 IMPLANT
CANISTER SUCT 3000ML PPV (MISCELLANEOUS) ×4 IMPLANT
CARTRIDGE OIL MAESTRO DRILL (MISCELLANEOUS) ×2 IMPLANT
CLOSURE STERI-STRIP 1/2X4 (GAUZE/BANDAGES/DRESSINGS) ×1
CLOSURE WOUND 1/2 X4 (GAUZE/BANDAGES/DRESSINGS) ×1
CLSR STERI-STRIP ANTIMIC 1/2X4 (GAUZE/BANDAGES/DRESSINGS) ×1 IMPLANT
CNTNR URN SCR LID CUP LEK RST (MISCELLANEOUS) ×2 IMPLANT
CONT SPEC 4OZ STRL OR WHT (MISCELLANEOUS) ×4
COVER BACK TABLE 60X90IN (DRAPES) ×6 IMPLANT
DERMABOND ADVANCED (GAUZE/BANDAGES/DRESSINGS) ×2
DERMABOND ADVANCED .7 DNX12 (GAUZE/BANDAGES/DRESSINGS) ×4 IMPLANT
DIFFUSER DRILL AIR PNEUMATIC (MISCELLANEOUS) ×4 IMPLANT
DILATOR INSULATED LLIF 8-13-18 (NEUROSURGERY SUPPLIES) ×2 IMPLANT
DISSECTOR BLUNT TIP ENDO 5MM (MISCELLANEOUS) ×2 IMPLANT
DRAPE C-ARM 42X72 X-RAY (DRAPES) ×6 IMPLANT
DRAPE C-ARMOR (DRAPES) ×6 IMPLANT
DRAPE LAPAROTOMY 100X72X124 (DRAPES) ×4 IMPLANT
DRAPE SHEET LG 3/4 BI-LAMINATE (DRAPES) ×4 IMPLANT
DRAPE SURG 17X23 STRL (DRAPES) ×6 IMPLANT
DRSG OPSITE POSTOP 4X10 (GAUZE/BANDAGES/DRESSINGS) ×4 IMPLANT
DRSG OPSITE POSTOP 4X8 (GAUZE/BANDAGES/DRESSINGS) ×2 IMPLANT
DURAPREP 26ML APPLICATOR (WOUND CARE) ×6 IMPLANT
ELECT BLADE 6.5 EXT (BLADE) ×2 IMPLANT
ELECT REM PT RETURN 9FT ADLT (ELECTROSURGICAL) ×4
ELECTRODE REM PT RTRN 9FT ADLT (ELECTROSURGICAL) ×2 IMPLANT
EXTENDER TAB GUIDE SV 5.5/6.0 (INSTRUMENTS) ×32 IMPLANT
GAUZE 4X4 16PLY RFD (DISPOSABLE) IMPLANT
GAUZE SPONGE 4X4 12PLY STRL (GAUZE/BANDAGES/DRESSINGS) IMPLANT
GAUZE SPONGE 4X4 16PLY XRAY LF (GAUZE/BANDAGES/DRESSINGS) ×2 IMPLANT
GLOVE BIO SURGEON STRL SZ 6.5 (GLOVE) ×1 IMPLANT
GLOVE BIO SURGEON STRL SZ7 (GLOVE) ×2 IMPLANT
GLOVE BIO SURGEON STRL SZ7.5 (GLOVE) ×2 IMPLANT
GLOVE BIO SURGEONS STRL SZ 6.5 (GLOVE) ×1
GLOVE BIOGEL PI IND STRL 7.0 (GLOVE) IMPLANT
GLOVE BIOGEL PI IND STRL 7.5 (GLOVE) ×4 IMPLANT
GLOVE BIOGEL PI IND STRL 8.5 (GLOVE) IMPLANT
GLOVE BIOGEL PI INDICATOR 7.0 (GLOVE)
GLOVE BIOGEL PI INDICATOR 7.5 (GLOVE) ×2
GLOVE BIOGEL PI INDICATOR 8.5 (GLOVE) ×2
GLOVE ECLIPSE 7.0 STRL STRAW (GLOVE) ×8 IMPLANT
GLOVE ECLIPSE 8.5 STRL (GLOVE) ×6 IMPLANT
GLOVE EXAM NITRILE XL STR (GLOVE) IMPLANT
GOWN STRL REUS W/ TWL LRG LVL3 (GOWN DISPOSABLE) ×4 IMPLANT
GOWN STRL REUS W/ TWL XL LVL3 (GOWN DISPOSABLE) ×4 IMPLANT
GOWN STRL REUS W/TWL 2XL LVL3 (GOWN DISPOSABLE) ×2 IMPLANT
GOWN STRL REUS W/TWL LRG LVL3 (GOWN DISPOSABLE) ×12
GOWN STRL REUS W/TWL XL LVL3 (GOWN DISPOSABLE) ×4
GUIDEWIRE BLUNT NT 450 (WIRE) ×16 IMPLANT
GUIDEWIRE LLIF TT 320 (WIRE) ×2 IMPLANT
GUIDEWIRE TROC TIP NIT 18 (WIRE) ×2 IMPLANT
HEMOSTAT POWDER KIT SURGIFOAM (HEMOSTASIS) ×6 IMPLANT
KIT BASIN OR (CUSTOM PROCEDURE TRAY) ×4 IMPLANT
KIT POSITION SURG JACKSON T1 (MISCELLANEOUS) ×4 IMPLANT
KIT PROCEDURE PROTEKDUO DL31 (CATHETERS) IMPLANT
KIT SPINE MAZOR X ROBO DISP (MISCELLANEOUS) ×4 IMPLANT
KIT TURNOVER KIT B (KITS) ×4 IMPLANT
KNIFE ANNULOTOMY RETRAC BLK (BLADE) ×2 IMPLANT
LIF ILLUMINATION SYSTEM STERIL (SYSTAGENIX WOUND MANAGEMENT) ×4
NDL HYPO 18GX1.5 BLUNT FILL (NEEDLE) IMPLANT
NDL SPNL 18GX3.5 QUINCKE PK (NEEDLE) IMPLANT
NEEDLE HYPO 18GX1.5 BLUNT FILL (NEEDLE) IMPLANT
NEEDLE HYPO 22GX1.5 SAFETY (NEEDLE) ×4 IMPLANT
NEEDLE SPNL 18GX3.5 QUINCKE PK (NEEDLE) ×4 IMPLANT
NS IRRIG 1000ML POUR BTL (IV SOLUTION) ×4 IMPLANT
OIL CARTRIDGE MAESTRO DRILL (MISCELLANEOUS) ×4
PACK LAMINECTOMY NEURO (CUSTOM PROCEDURE TRAY) ×4 IMPLANT
PAD ARMBOARD 7.5X6 YLW CONV (MISCELLANEOUS) ×20 IMPLANT
PIN HEAD 2.5X60MM (PIN) IMPLANT
PLATE END OBELISC LAT 45 0D LT (Plate) ×4 IMPLANT
PROTEKDUO KIT-DL31 (CATHETERS) ×4
PUTTY DBM 5CC ×2 IMPLANT
ROD STRT PERCU 5.5X150 TI (Rod) ×4 IMPLANT
SCREW 6.5X40 VOYAGER MAS FNS (Screw) ×8 IMPLANT
SCREW MA FENS 5.5X40 (Screw) ×8 IMPLANT
SCREW OBELISC LOCKING (Screw) ×2 IMPLANT
SCREW SCHANZ SA 4.0MM (MISCELLANEOUS) IMPLANT
SCREW SET 5.5/6.0MM SOLERA (Screw) ×16 IMPLANT
SPONGE LAP 4X18 RFD (DISPOSABLE) IMPLANT
SPONGE SURGIFOAM ABS GEL SZ50 (HEMOSTASIS) ×2 IMPLANT
SPONGE TONSIL TAPE 1 RFD (DISPOSABLE) IMPLANT
STAPLER VISISTAT 35W (STAPLE) ×2 IMPLANT
STRIP CLOSURE SKIN 1/2X4 (GAUZE/BANDAGES/DRESSINGS) ×1 IMPLANT
SUT MNCRL AB 4-0 PS2 18 (SUTURE) ×8 IMPLANT
SUT VIC AB 0 CT1 18XCR BRD8 (SUTURE) ×4 IMPLANT
SUT VIC AB 0 CT1 8-18 (SUTURE) ×4
SUT VIC AB 2-0 CT1 18 (SUTURE) ×4 IMPLANT
SUT VIC AB 3-0 SH 8-18 (SUTURE) ×2 IMPLANT
SUT VICRYL 3-0 RB1 18 ABS (SUTURE) ×2 IMPLANT
SYSTEM ILLUMINATION LIF STERIL (SYSTAGENIX WOUND MANAGEMENT) IMPLANT
TOWEL GREEN STERILE (TOWEL DISPOSABLE) ×4 IMPLANT
TOWEL GREEN STERILE FF (TOWEL DISPOSABLE) ×4 IMPLANT
TRAY FOLEY MTR SLVR 16FR STAT (SET/KITS/TRAYS/PACK) ×6 IMPLANT
TUBE MAZOR SA REDUCTION (TUBING) ×4 IMPLANT
WATER STERILE IRR 1000ML POUR (IV SOLUTION) ×4 IMPLANT

## 2020-01-22 NOTE — Consult Note (Signed)
WOC Nurse Consult Note: Patient receiving care in [facility] 5N25.  Spouse at bedside. Reason for Consult: left heel wound, followed at outpatient wound care center. Wound type: diabetic foot ulcer Pressure Injury POA: Yes/No/NA Measurement: 7 cm x 7 cm x no observable depth Wound bed: entire area is dry, brown, heavily callused Drainage (amount, consistency, odor) none Periwound: intact Dressing procedure/placement/frequency:  Wash left heel wound with soap and water.  Pat dry. Wrap in approximately 1/2 roll of kerlex. Perform daily. Monitor the wound area(s) for worsening of condition such as: Signs/symptoms of infection,  Increase in size,  Development of or worsening of odor, Development of pain, or increased pain at the affected locations.  Notify the medical team if any of these develop.  Thank you for the consult.  Discussed plan of care with the patient.  [place] nurse will not follow at this time.  Please re-consult the [place] team if needed.  Val Riles, RN, MSN, CWOCN, CNS-BC, pager [phone]

## 2020-01-22 NOTE — Anesthesia Procedure Notes (Signed)
Arterial Line Insertion Start/End3/25/2021 12:35 PM, 01/22/2020 12:45 PM Performed by: Babs Bertin, CRNA, CRNA  Preanesthetic checklist: patient identified, IV checked, risks and benefits discussed, surgical consent, monitors and equipment checked, pre-op evaluation and timeout performed Lidocaine 1% used for infiltration and patient sedated Left, radial was placed Catheter size: 20 G Hand hygiene performed , maximum sterile barriers used  and Seldinger technique used Allen's test indicative of satisfactory collateral circulation Attempts: 1 Procedure performed without using ultrasound guided technique. Following insertion, Biopatch and line sutured. Post procedure assessment: normal  Patient tolerated the procedure well with no immediate complications.

## 2020-01-22 NOTE — Op Note (Signed)
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 L1 corpectomy via left lateral retroperitoneal and retropleural approach using AlphaTec lateral retractor 2. Reconstruction with expandable titanium cage 3.Percutaneous segmental instrumentation with pedicle screw fixation at T11, T12, L2, and L3 and interconnecting rods 4. Stereotactic guidance of pedicle screw placement using Med[employer]onic Mazor robot 5.  Harvest of local autograft from 12th rib 6. Use of morselized allograft  SURGEON: Dr. Duffy Rhody, MD  ASSISTANT: Dr. Kristeen Miss. Please note, there were no qualified trainees available to assist with the procedure.  An assistant was required for aid in retraction of the neural elements.   ANESTHESIA: General Endotracheal  EBL: 500 ml  SPECIMENS: L1 vertebra, disc.  Cultures sent.  DRAINS: None  COMPLICATIONS: None  CONDITION: Stable to PCAU  HISTORY: [name] [name] is a 64 year old woman with uncontrolled diabetes and history of multiple infections who presented with severe mid back pain worsening over the past month. She was found to have a severe collapse of her L1 vertebral body suggestive of osteomyelitis. Essentially, her whole body was eroded. She had some phlegmon contained by her PLL causing moderate stenosis but other than some L1 radiculopathy bilaterally, she was neurologically intact. I had a long discussion with the patient and her husband regarding treatment options. Given the severity of her spinal collapse, she would not be able to mobilize out of bed until her spine is stabilized. Additionally, debridement of her vertebral body and disc spaces could afford some benefit for infection control. As such, I recommended a L1 corpectomy and posterior segmental instrumentation for stabilization. I discussed with them in detail that she did have erosion and infection  involving some of the endplates of M35 and L2 as well which could compromise the success of stabilization surgery but this was the only reasonable option despite the numerous risks. Risks, benefits, alternatives, expected, since were discussed with them. Risks discussed included but were not limited to bleeding, pain, infection, continued instability, neurologic deficit, paralysis, pseudoarthrosis, vascular for hollow viscus injury, injury to intra-abdominal organs, coma and death. They wish to proceed with surgery. Informed consent was obtained.  PROCEDURE IN DETAIL: Patient was brought to the operating room. After appropriate lines and changes just lateral to the body. Subsequent dilators and finally the Alphatec retractor was docked. The retractor was then expanded. To get access to the vertebral body, the phlegmon involving the muscle was partially resected with the Bovie. Serum x-ray confirmed appropriate expansion of the retractor exposing the T12-L1 disc space and the L1-L2 disc.. The L1 body was almost fully eroded and replaced with phlegmon. The discs were then separated from the T12 inferior endplate and the T6-R4 disc. Endplate with a Cobb. Care was taken not to intrude into the erosions of the endplate. The disc, L1 body, were placed, patient was intubated by the anesthesia service. She was then positioned in lateral decubitus with the left side up all pressure points padded and eyes protected. C-arm x-ray was used to position patient in a perfectly lateral position. The table was slightly bent to open up the lateral approach space. Using C-arm x-ray, a incision was planned over the L1 body. Area was prepped and draped in sterile fashion. 1% lidocaine with epinephrine injected in the skin. Time was performed. Preoperative antibiotics were given. Incision made with a 10 blade and monopolar cautery was used to incise the subcutaneous tissue. Blunt dissection through the muscle layer was performed  until  the 11th and 12th ribs were encountered. The intercostal muscles were dissected from the 12th rib and the neurovascular bundle was separated from the 12th rib. The 12th rib was then cut and harvested for autograft. The left pleural space and the left peritoneal space were identified. The retroperitoneal space was then dissected bluntly down to the psoas muscle which was inflamed and filled with phlegmon. The peritoneal structures were dissected anteriorly. Initial dilator was then placed on the sulcus at the L1 body and C-arm x-ray confirmed appropriate placement. It was then docked onto the Phlegmonatous psoas muscle adjacent to the L1 body. Subsequent dilators were used to dilate up to the Alphatec retractor. C-arm x-ray confirmed good placement. The retractor was opened up to expose the edge the second endplates. To get access to the disc and vertebral body, the phlegmon mixed with muscle was incised with Bovie. The T12-L1 and the L1-L2 discs were incised and complex used to separate 10 from the superior and inferior endplates. Care was taken not to intrude into the erosions through the endplate. Disc and eroded L1 body removed with combination of pituitary rongeurs and Kerrison rongeurs. The vertebral body resection was carried to the AL L anteriorly. Posteriorly, resection continued until close to the PLL. Meticulous hemostasis was obtained. With the discs and positive body removed, trial was placed and a 20 x 45 mmUlrich Obelisc expandable cage with 0 degree end caps was placed into the space and expanded from 28 mm to approximately 33 mm. C-arm x-ray confirmed excellent placement and good height restoration. The cage was then locked. The cage had been filled with autograft from the 12th rib as well as morselized allograft DBM. The retractors were then removed. There is no evidence of any pleural intrusion and no significant bleeding. The muscle layer was then closed with 0 Vicryl stitches with the dermal  layer closed with 2-0 Vicryl stitches. The skin was closed with 4-0 Monocryl in subcuticular manner followed by Mastisol and Steri-Strips.   Patient was then placed prone on a Jackson table carefully with all pressure points padded and eyes protected. Her lower back was preprepped with alcohol and prepped and draped in sterile fashion. The Mazor robot was then introduced in the field and using C-arm x-ray was used to register the patient after placing a PSIS pin on the left side. Preoperatively, pedicle screw trajectories were planned for T11, T12, L2, and L3. Percutaneous incisions were made through the skin and fascia and the Mazor robot guided the cannula for the drill to cannulate the pedicles of T11-T12 L2 and L3 bilaterally with that K wires placed to hold the position. C-arm x-ray AP and lateral confirmed appropriate cannulation of the pedicles. Size 5.5 x 40 mm pedicle screws were then placed at T11 and T12 bilaterally with 6.5 x 40 mm screws placed at L2 and L3 bilaterally over the K wires which were then removed. C-arm x-rays confirmed excellent positioning of the pedicle screws. 150 mm rods were then passed under the fascia through the towers and secured with screw caps. C-arm x-ray confirmed appropriate locking of the rod with good positioning. The screws were final tightened. The tabs were removed. The wound was was irrigated with irrigation impregnated with antibiotics. The fascial layers were closed with 0 Vicryl stitches. Dermal layer was closed with 2-0 Vicryl suture. The 4 incisions were closed with 4 Monocryl in subcuticular manner followed by Mastisol and Steri-Strips.. The PSIS pin site was closed with 4-0 Monocryl in subcuticular  manner followed by Mastisol and Steri-Strips. Sterile dressings were then placed. Patient was then flipped supine extubated by the anesthesia service. She was following commands and all 4 extremities. All counts were correct at the end of surgery. No complications  were noted.

## 2020-01-22 NOTE — Progress Notes (Signed)
[name], adult   DOB: September 22, 1956, 64 y.o.   MRN: [id number]         [facility]  Date of Admission:  01/20/2020     ASSESSMENT: She will undergo lumbar debridement and hardware stabilization this afternoon.  Once specimens have been obtained for Gram stain and culture I will start IV vancomycin.  I told them that we will probably have a PICC placed tomorrow.  PLAN: 1. Start vancomycin after surgery today pending Gram stain and cultures  Principal Problem:   Osteomyelitis of vertebra of thoracolumbar region [employer]) Active Problems:   Paraspinal mass   Ulcer of left heel, limited to breakdown of skin (HCC)   Septic arthritis of elbow, right (HCC)   Essential hypertension   Hepatic cirrhosis (HCC)   CAD (coronary artery disease)   CKD (chronic kidney disease), stage III ([place])   Type 2 diabetes mellitus with hyperglycemia, with long-term current use of insulin ([place])   Scheduled Meds: . [MAR Hold] amLODipine  5 mg Oral Daily  . [MAR Hold] atorvastatin  10 mg Oral Daily  . [MAR Hold] DULoxetine  60 mg Oral Daily  . [MAR Hold] feeding supplement (GLUCERNA SHAKE)  237 mL Oral TID BM  . fentaNYL (SUBLIMAZE) injection  50 mcg Intravenous Once  . [MAR Hold] insulin aspart  0-9 Units Subcutaneous TID WC  . [MAR Hold] insulin glargine  20 Units Subcutaneous Daily  . [MAR Hold] insulin glargine  30 Units Subcutaneous Q2200  . [MAR Hold] lidocaine  1 patch Transdermal Q24H  . [MAR Hold] multivitamin with minerals  1 tablet Oral Daily  . mupirocin ointment  1 application Topical BID  . [MAR Hold] pregabalin  150 mg Oral BID  . [MAR Hold] senna  1 tablet Oral Daily  . [MAR Hold] SUFentanil (SUFENTA) 250 mcg/NS *250 ml* infusion (1 mcg/ml)  0.25 mcg/kg/hr Intravenous To OR   Continuous Infusions: . vancomycin     PRN Meds:.[MAR Hold] hydrALAZINE, [MAR Hold]  HYDROmorphone (DILAUDID) injection, [MAR Hold] ondansetron **OR** [MAR Hold]  ondansetron (ZOFRAN) IV   SUBJECTIVE: There has been no change in her severe back pain.  Review of Systems: Review of Systems  Constitutional: Negative for fever.  Musculoskeletal: Negative for back pain.    Allergies  Allergen Reactions  . Bee Pollen Anaphylaxis  . Fish-Derived Products Hives, Shortness Of Breath, Swelling and Rash    Hives get in throat causing trouble breathing  . Mushroom Extract Complex Anaphylaxis  . Penicillins Anaphylaxis    Did it involve swelling of the face/tongue/throat, SOB, or low BP? Yes Did it involve sudden or severe rash/hives, skin peeling, or any reaction on the inside of your mouth or nose? No Did you need to seek medical attention at a hospital or doctor's office? Yes When did it last happen?A few months ago If all above answers are "NO", may proceed with cephalosporin use.  [name] Kitchen Rosemary Oil Anaphylaxis  . Shellfish Allergy Hives, Shortness Of Breath, Swelling and Rash  . Tomato Hives and Shortness Of Breath    Hives in throat causes her trouble breathing  . Acetaminophen Other (See Comments)    GI upset  . Acyclovir And Related Other (See Comments)    Unknown rxn  . Aloe Vera Hives  . Broccoli [Brassica Oleracea] Hives  . Naproxen Other (See Comments)    Unknown rxn    OBJECTIVE: Vitals:   01/21/20 1440 01/21/20 2006 01/22/20 0356 01/22/20 0715  BP: [name] Kitchen)  119/58 (!) 146/68 (!) 167/82 (!) 164/79  Pulse: 92 87 89 83  Resp: 16 16 16 16   Temp: 98 F (36.7 C) 98.9 F (37.2 C) 98.3 F (36.8 C) 98.1 F (36.7 C)  TempSrc: Oral Oral Oral Oral  SpO2: 96% 93% 96% 97%  Weight:      Height:       Body mass index is 34.02 kg/m.  Physical Exam Constitutional:      Comments: She is resting quietly in bed.  Her husband is at the bedside.     Lab Results Lab Results  Component Value Date   WBC 6.5 01/22/2020   HGB 8.8 (L) 01/22/2020   HCT 29.1 (L) 01/22/2020   MCV 83.9 01/22/2020   PLT 209 01/22/2020    Lab Results    Component Value Date   CREATININE 1.58 (H) 01/22/2020   BUN 19 01/22/2020   NA 139 01/22/2020   K 4.5 01/22/2020   CL 103 01/22/2020   CO2 28 01/22/2020    Lab Results  Component Value Date   ALT 6 01/22/2020   AST 10 (L) 01/22/2020   ALKPHOS 90 01/22/2020   BILITOT 0.7 01/22/2020     Microbiology: Recent Results (from the past 240 hour(s))  Blood culture (routine x 2)     Status: None (Preliminary result)   Collection Time: 01/20/20  1:34 PM   Specimen: BLOOD RIGHT HAND  Result Value Ref Range Status   Specimen Description BLOOD RIGHT HAND  Final   Special Requests   Final    BOTTLES DRAWN AEROBIC ONLY Blood Culture results may not be optimal due to an inadequate volume of blood received in culture bottles   Culture   Final    NO GROWTH 2 DAYS Performed at [facility], [place] [address], [place], [place] [address]    Report Status PENDING  Incomplete  Blood culture (routine x 2)     Status: None (Preliminary result)   Collection Time: 01/20/20  3:28 PM   Specimen: BLOOD  Result Value Ref Range Status   Specimen Description BLOOD RIGHT ANTECUBITAL  Final   Special Requests   Final    BOTTLES DRAWN AEROBIC AND ANAEROBIC Blood Culture adequate volume   Culture   Final    NO GROWTH 2 DAYS Performed at [facility], [place] [address] [place], [place] [address]    Report Status PENDING  Incomplete  SARS CORONAVIRUS 2 (TAT 6-24 HRS) Nasopharyngeal Nasopharyngeal Swab     Status: None   Collection Time: 01/20/20  3:37 PM   Specimen: Nasopharyngeal Swab  Result Value Ref Range Status   SARS Coronavirus 2 NEGATIVE NEGATIVE Final    Comment: (NOTE) SARS-CoV-2 target nucleic acids are NOT DETECTED. The SARS-CoV-2 RNA is generally detectable in upper and lower respiratory specimens during the acute phase of infection. Negative results do not preclude SARS-CoV-2 infection, do not rule out co-infections with other pathogens, and should not be used as the sole basis  for treatment or other patient management decisions. Negative results must be combined with clinical observations, patient history, and epidemiological information. The expected result is Negative. Fact Sheet for Patients: SugarRoll.be Fact Sheet for Healthcare Providers: https://www.woods-mathews.com/ This test is not yet approved or cleared by the [place] FDA and  has been authorized for detection and/or diagnosis of SARS-CoV-2 by FDA under an Emergency Use Authorization (EUA). This EUA will remain  in effect (meaning this test can be used) for the duration  of the COVID-19 declaration under Section 56 4(b)(1) of the Act, 21 U.S.C. section 360bbb-3(b)(1), unless the authorization is terminated or revoked sooner. Performed at [facility], [place] [address] [place], [place] [address]   MRSA PCR Screening     Status: None   Collection Time: 01/21/20  5:25 AM   Specimen: Nasal Mucosa; Nasopharyngeal  Result Value Ref Range Status   MRSA by PCR NEGATIVE NEGATIVE Final    Comment:        The GeneXpert MRSA Assay (FDA approved for NASAL specimens only), is one component of a comprehensive MRSA colonization surveillance program. It is not intended to diagnose MRSA infection nor to guide or monitor treatment for MRSA infections. Performed at [facility], [place] [address] [place], [place] [address]   Surgical pcr screen     Status: Abnormal   Collection Time: 01/21/20  3:24 PM   Specimen: Nasal Mucosa; Nasal Swab  Result Value Ref Range Status   MRSA, PCR NEGATIVE NEGATIVE Final   Staphylococcus aureus POSITIVE (A) NEGATIVE Final    Comment: (NOTE) The Xpert SA Assay (FDA approved for NASAL specimens in patients 44 years of age and older), is one component of a comprehensive surveillance program. It is not intended to diagnose infection nor to guide or monitor treatment. Performed at [facility], [place] [address], [place], [place] [address]   Surgical pcr screen     Status: Abnormal   Collection Time: 01/22/20  3:46 AM   Specimen: Nasal Mucosa; Nasal Swab  Result Value Ref Range Status   MRSA, PCR NEGATIVE NEGATIVE Final   Staphylococcus aureus POSITIVE (A) NEGATIVE Final    Comment: CRITICAL RESULT CALLED TO, READ BACK BY AND VERIFIED WITH: RN DAVID Mamie Nick [phone] @[phone]  THANEY Performed at [facility], [place] [address], [place], [place] [address]     Michel Bickers, [place] [facility] [place] [facility] [phone] pager   [phone] cell 01/22/2020, 12:25 PM

## 2020-01-22 NOTE — Anesthesia Procedure Notes (Signed)
Procedure Name: Intubation Date/Time: 01/22/2020 1:32 PM Performed by: Kyung Rudd, CRNA Pre-anesthesia Checklist: Patient identified, Emergency Drugs available, Suction available and Patient being monitored Patient Re-evaluated:Patient Re-evaluated prior to induction Oxygen Delivery Method: Circle system utilized Preoxygenation: Pre-oxygenation with 100% oxygen Induction Type: IV induction Ventilation: Mask ventilation without difficulty Laryngoscope Size: Mac and 3 Grade View: Grade I Tube type: Oral Tube size: 7.0 mm Number of attempts: 1 Airway Equipment and Method: Stylet Placement Confirmation: ETT inserted through vocal cords under direct vision,  positive ETCO2 and breath sounds checked- equal and bilateral Secured at: 20 cm Tube secured with: Tape Dental Injury: Teeth and Oropharynx as per pre-operative assessment

## 2020-01-22 NOTE — Anesthesia Postprocedure Evaluation (Signed)
Anesthesia Post Note  Patient: [name] [name]  Procedure(s) Performed: Lumbar One LATERAL CORPECTOMY AND RECONSTRUCTION WITH CAGE; Thoracic Eleven- Lumbar Three posterior instrumented fusion; Mazor Robot (N/A Spine Lumbar) Thoracic Eleven-Lumbar Three POSTERIOR INSTRUMENTED FUSION (N/A Spine Lumbar) APPLICATION OF ROBOTIC ASSISTANCE FOR SPINAL PROCEDURE (N/A )     Patient location during evaluation: PACU Anesthesia Type: General Level of consciousness: awake Pain management: pain level controlled Vital Signs Assessment: post-procedure vital signs reviewed and stable Respiratory status: spontaneous breathing, nonlabored ventilation, respiratory function stable and patient connected to nasal cannula oxygen Cardiovascular status: blood pressure returned to baseline and stable Postop Assessment: no apparent nausea or vomiting Anesthetic complications: no    Last Vitals:  Vitals:   01/22/20 2140 01/22/20 2154  BP: (!) 147/71 (!) 144/68  Pulse: 96 95  Resp: 11 16  Temp:  36.6 C  SpO2: 100% 98%    Last Pain:  Vitals:   01/22/20 2154  TempSrc: Oral  PainSc:                  Karyl Kinnier Sheryl Towell

## 2020-01-22 NOTE — Progress Notes (Signed)
Pharmacy Antibiotic Note  [name] [name] is a 64 y.o. adult admitted on 01/20/2020 with osteomyelitis.  Pharmacy has been consulted for vancomycin dosing.  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 S/p lumbar debridement and hardware stabilization earlier today. No fevers or elevation in wbc noted.   Patient was given a prophylactic dose of vancomycin prior to procedure.   Plan Vancomycin 1g now to equal 2g load today then start 554m IV Q 24 hrs. Goal AUC 400-550. Expected AUC: 453 SCr used: 1.58  Will need to follow renal function closely and adjust doses as indicated.  Will plan on levels as soon as steady state is reached.   Height: 5' 1"  (154.9 cm) Weight: 180 lb 0.8 oz (81.7 kg) IBW/kg (Calculated) : 47.8  Temp (24hrs), Avg:98.1 F (36.7 C), Min:97.8 F (36.6 C), Max:98.3 F (36.8 C)  Recent Labs  Lab 01/20/20 1056 01/21/20 0619 01/22/20 0534  WBC 7.6 7.7 6.5  CREATININE 1.35* 1.55* 1.58*    Estimated Creatinine Clearance (by C-G formula based on SCr of 1.58 mg/dL (H)) Female: 34.9 mL/min (A) Female: 42.8 mL/min (A)    Allergies  Allergen Reactions  . Bee Pollen Anaphylaxis  . Fish-Derived Products Hives, Shortness Of Breath, Swelling and Rash    Hives get in throat causing trouble breathing  . Mushroom Extract Complex Anaphylaxis  . Penicillins Anaphylaxis    Did it involve swelling of the face/tongue/throat, SOB, or low BP? Yes Did it involve sudden or severe rash/hives, skin peeling, or any reaction on the inside of your mouth or nose? No Did you need to seek medical attention at a hospital or doctor's office? Yes When did it last happen?A few months ago If all above answers are "NO", may proceed with cephalosporin use.  .[name] KitchenRosemary Oil Anaphylaxis  . Shellfish Allergy Hives, Shortness Of Breath, Swelling and Rash  . Tomato Hives and Shortness Of Breath    Hives in throat causes her trouble breathing  . Acetaminophen Other  (See Comments)    GI upset  . Acyclovir And Related Other (See Comments)    Unknown rxn  . Aloe Vera Hives  . Broccoli [Brassica Oleracea] Hives  . Naproxen Other (See Comments)    Unknown rxn    Thank you for allowing pharmacy to be a part of this patient's care.  FErin HearingPharmD., BCPS Clinical Pharmacist 01/22/2020 9:59 PM

## 2020-01-22 NOTE — Anesthesia Preprocedure Evaluation (Addendum)
Anesthesia Evaluation  Patient identified by MRN, date of birth, ID band Patient awake    Reviewed: Allergy & Precautions, NPO status , Patient's Chart, lab work & pertinent test results  History of Anesthesia Complications Negative for: history of anesthetic complications  Airway Mallampati: II  TM Distance: >3 FB Neck ROM: Full    Dental  (+) Dental Advisory Given, Edentulous Upper, Poor Dentition, Missing, Chipped   Pulmonary asthma , sleep apnea ,    Pulmonary exam normal breath sounds clear to auscultation       Cardiovascular hypertension, Pt. on medications + angina + CAD and + Past MI  (-) Cardiac Stents Normal cardiovascular exam Rhythm:Regular Rate:Normal     Neuro/Psych PSYCHIATRIC DISORDERS Anxiety Depression L1 osteomyelitis   Neuromuscular disease    GI/Hepatic GERD  ,(+) Cirrhosis       ,   Endo/Other  diabetes, Poorly Controlled, Type 2, Insulin DependentObesity   Renal/GU Renal InsufficiencyRenal disease     Musculoskeletal  (+) Arthritis , Fibromyalgia -  Abdominal   Peds  Hematology  (+) Blood dyscrasia, anemia ,   Anesthesia Other Findings Day of surgery medications reviewed with the patient.  Reproductive/Obstetrics                            Anesthesia Physical Anesthesia Plan  ASA: IV  Anesthesia Plan: General   Post-op Pain Management:    Induction: Intravenous  PONV Risk Score and Plan: 4 or greater and Midazolam, Dexamethasone, Ondansetron and Propofol infusion  Airway Management Planned: Oral ETT  Additional Equipment: Arterial line, CVP and Ultrasound Guidance Line Placement  Intra-op Plan:   Post-operative Plan: Possible Post-op intubation/ventilation  Informed Consent: I have reviewed the patients History and Physical, chart, labs and discussed the procedure including the risks, benefits and alternatives for the proposed anesthesia with the  patient or authorized representative who has indicated his/her understanding and acceptance.     Dental advisory given  Plan Discussed with: CRNA  Anesthesia Plan Comments:        Anesthesia Quick Evaluation

## 2020-01-22 NOTE — Progress Notes (Addendum)
[facility]                                                                              Patient Demographics  [name] [name], is a 64 y.o. adult, DOB - 09/23/1956, VPX:[id number]  Admit date - 01/20/2020   Admitting Physician [name] Patience, MD  Outpatient Primary MD for the patient is [name] [name], [name] Apa, DO  Outpatient specialists:   LOS - 2  days   Medical records reviewed and are as summarized below:    Chief Complaint  Patient presents with  . Constipation       Brief summary   Patient is a 64 year old female with history of diabetes mellitus 2, CKD stage III, history of recent septic elbow, L1 compression fracture presented with worsening back pain, patient was found to have evidence concerning L1 osteomyelitis. Patient was admitted for further work-up.  Assessment & Plan    Principal Problem:   Osteomyelitis of vertebra of thoracolumbar region [facility]), L1 lumbar discitis with paraspinal fluid enhancement -Currently in significant pain, 8/10, worse on movement -Neurosurgery consulted, plan for OR today, continue n.p.o., IV fluids -ID following, appreciate recommendations, recommended L1 biopsy, observe off antibiotics for now until biopsy and culture in OR  Active Problems: Diabetes mellitus type 2, uncontrolled with hyperglycemia -Continue twice daily Lantus dosing with sliding scale coverage -Hemoglobin A1c 12.5    CKD (chronic kidney disease), stage IIIb (HCC) -Baseline creatinine 1.5-1.6 -Currently at baseline    Essential hypertension -BP somewhat elevated, placed on Norvasc 5 mg daily -Added hydralazine IV as needed with parameters    Hepatic cirrhosis (HCC) -No acute issues    CAD (coronary artery disease) -Currently no chest pain or any anginal symptoms  Code Status: Full CODE STATUS DVT Prophylaxis: Hold DVT prophylaxis due to surgery Family Communication: Discussed all imaging results, lab results, explained to the  patient's husband on phone.    Disposition Plan: Patient from home, anticipate discharge home in 2-3 days, will need PICC line and prolonged IV antibiotics postoperatively   Time Spent in minutes   35 minutes  Procedures:  None  Consultants:   Neurosurgery Infectious disease  Antimicrobials:   Anti-infectives (From admission, onward)   Start     Dose/Rate Route Frequency Ordered Stop   01/22/20 0800  vancomycin (VANCOCIN) IVPB 1000 mg/200 mL premix     1,000 mg 200 mL/hr over 60 Minutes Intravenous To [facility] 01/22/20 0330 01/23/20 0800          Medications  Scheduled Meds: . atorvastatin  10 mg Oral Daily  . DULoxetine  60 mg Oral Daily  . feeding supplement (GLUCERNA SHAKE)  237 mL Oral TID BM  . insulin aspart  0-9 Units Subcutaneous TID WC  . insulin glargine  20 Units Subcutaneous Daily  . insulin glargine  30 Units Subcutaneous Q2200  . lidocaine  1 patch Transdermal Q24H  . multivitamin with minerals  1 tablet Oral Daily  . mupirocin ointment  1 application Topical BID  . pregabalin  150 mg Oral BID  . senna  1 tablet Oral Daily   Continuous Infusions: . vancomycin  PRN Meds:.HYDROmorphone (DILAUDID) injection, ondansetron **OR** ondansetron (ZOFRAN) IV      Subjective:   [name] [name] was seen and examined today.  Currently in significant pain, awaiting surgery.  No fevers or chills.  No acute focal neurological deficits. Patient denies dizziness, chest pain, shortness of breath, abdominal pain. No acute events overnight.    Objective:   Vitals:   01/21/20 1440 01/21/20 2006 01/22/20 0356 01/22/20 0715  BP: (!) 119/58 (!) 146/68 (!) 167/82 (!) 164/79  Pulse: 92 87 89 83  Resp: 16 16 16 16   Temp: 98 F (36.7 C) 98.9 F (37.2 C) 98.3 F (36.8 C) 98.1 F (36.7 C)  TempSrc: Oral Oral Oral Oral  SpO2: 96% 93% 96% 97%  Weight:      Height:        Intake/Output Summary (Last 24 hours) at 01/22/2020 0941 Last data filed at  01/22/2020 3536 Gross per 24 hour  Intake 360 ml  Output 200 ml  Net 160 ml     Wt Readings from Last 3 Encounters:  01/20/20 81.7 kg  11/18/19 84 kg  11/12/19 81 kg     Exam  General: Alert and oriented x 3, NAD, uncomfortable with pain  Cardiovascular: S1 S2 auscultated, no murmurs, RRR  Respiratory: Clear to auscultation bilaterally, no wheezing, rales or rhonchi  Gastrointestinal: Soft, nontender, nondistended, + bowel sounds  Ext: no pedal edema bilaterally  Neuro: No new FND's  Musculoskeletal: No digital cyanosis, clubbing  Skin: No rashes  Psych: Normal affect and demeanor, alert and oriented x3    Data Reviewed:  I have personally reviewed following labs and imaging studies  Micro Results Recent Results (from the past 240 hour(s))  Blood culture (routine x 2)     Status: None (Preliminary result)   Collection Time: 01/20/20  1:34 PM   Specimen: BLOOD RIGHT HAND  Result Value Ref Range Status   Specimen Description BLOOD RIGHT HAND  Final   Special Requests   Final    BOTTLES DRAWN AEROBIC ONLY Blood Culture results may not be optimal due to an inadequate volume of blood received in culture bottles   Culture   Final    NO GROWTH 2 DAYS Performed at [facility], [place] [address] [place], [place] [address]    Report Status PENDING  Incomplete  Blood culture (routine x 2)     Status: None (Preliminary result)   Collection Time: 01/20/20  3:28 PM   Specimen: BLOOD  Result Value Ref Range Status   Specimen Description BLOOD RIGHT ANTECUBITAL  Final   Special Requests   Final    BOTTLES DRAWN AEROBIC AND ANAEROBIC Blood Culture adequate volume   Culture   Final    NO GROWTH 2 DAYS Performed at [facility], [place] [address], [place], [place] [address]    Report Status PENDING  Incomplete  SARS CORONAVIRUS 2 (TAT 6-24 HRS) Nasopharyngeal Nasopharyngeal Swab     Status: None   Collection Time: 01/20/20  3:37 PM   Specimen: Nasopharyngeal Swab   Result Value Ref Range Status   SARS Coronavirus 2 NEGATIVE NEGATIVE Final    Comment: (NOTE) SARS-CoV-2 target nucleic acids are NOT DETECTED. The SARS-CoV-2 RNA is generally detectable in upper and lower respiratory specimens during the acute phase of infection. Negative results do not preclude SARS-CoV-2 infection, do not rule out co-infections with other pathogens, and should not be used as the sole basis for treatment or other patient management decisions.  Negative results must be combined with clinical observations, patient history, and epidemiological information. The expected result is Negative. Fact Sheet for Patients: SugarRoll.be Fact Sheet for Healthcare Providers: https://www.woods-mathews.com/ This test is not yet approved or cleared by the [place] FDA and  has been authorized for detection and/or diagnosis of SARS-CoV-2 by FDA under an Emergency Use Authorization (EUA). This EUA will remain  in effect (meaning this test can be used) for the duration of the COVID-19 declaration under Section 56 4(b)(1) of the Act, 21 U.S.C. section 360bbb-3(b)(1), unless the authorization is terminated or revoked sooner. Performed at [facility], [place] [address], [place], [place] [address]   MRSA PCR Screening     Status: None   Collection Time: 01/21/20  5:25 AM   Specimen: Nasal Mucosa; Nasopharyngeal  Result Value Ref Range Status   MRSA by PCR NEGATIVE NEGATIVE Final    Comment:        The GeneXpert MRSA Assay (FDA approved for NASAL specimens only), is one component of a comprehensive MRSA colonization surveillance program. It is not intended to diagnose MRSA infection nor to guide or monitor treatment for MRSA infections. Performed at [facility], [place] [address], [place], [place] [address]   Surgical pcr screen     Status: Abnormal   Collection Time: 01/21/20  3:24 PM   Specimen: Nasal Mucosa; Nasal Swab    Result Value Ref Range Status   MRSA, PCR NEGATIVE NEGATIVE Final   Staphylococcus aureus POSITIVE (A) NEGATIVE Final    Comment: (NOTE) The Xpert SA Assay (FDA approved for NASAL specimens in patients 55 years of age and older), is one component of a comprehensive surveillance program. It is not intended to diagnose infection nor to guide or monitor treatment. Performed at [facility], [place] [address] [place], [place] [address]   Surgical pcr screen     Status: Abnormal   Collection Time: 01/22/20  3:46 AM   Specimen: Nasal Mucosa; Nasal Swab  Result Value Ref Range Status   MRSA, PCR NEGATIVE NEGATIVE Final   Staphylococcus aureus POSITIVE (A) NEGATIVE Final    Comment: CRITICAL RESULT CALLED TO, READ BACK BY AND VERIFIED WITH: RN DAVID Mamie Nick [phone] @0610  THANEY Performed at [facility], [address], [place], [place] [address]     Radiology Reports CT ABDOMEN PELVIS WO CONTRAST  Result Date: 01/20/2020 CLINICAL DATA:  Lower abdominal pain, constipation EXAM: CT ABDOMEN AND PELVIS WITHOUT CONTRAST TECHNIQUE: Multidetector CT imaging of the abdomen and pelvis was performed following the standard protocol without IV contrast. COMPARISON:  11/15/2019 FINDINGS: Lower chest: No acute pleural or parenchymal lung disease. Hepatobiliary: Nodular contour of the liver compatible with cirrhosis, stable. Calcified granulomata are seen within the liver parenchyma. The gallbladder is surgically absent. Pancreas: Unremarkable. No pancreatic ductal dilatation or surrounding inflammatory changes. Spleen: Normal in size without focal abnormality. Adrenals/Urinary Tract: No urinary tract calculi or obstructive uropathy. The bladder is unremarkable. The adrenals are normal. Stomach/Bowel: No bowel obstruction or ileus. Moderate fecal retention. Normal appendix right lower quadrant. No bowel wall thickening or inflammatory changes. Vascular/Lymphatic: Multiple subcentimeter lymph nodes are  seen within the retroperitoneum, increased in number since prior study. No pathologically enlarged lymph nodes. Vascular calcifications are again seen throughout the aorta and its branches. Reproductive: Status post hysterectomy. No adnexal masses. Other: There is no free fluid or free gas within the abdomen or pelvis. Musculoskeletal: Since the prior exam, there is been progression of  the compression deformity at L1, with marked bony resorption and development of vertebral plana. There is also cortical erosion within the superior endplate of L2 and inferior endplate of W23. Overall, the appearance is highly concerning for progressive discitis/osteomyelitis. There is mild retropulsion of the L1 vertebral body with at least 50% narrowing of the central canal at the L1 level. MRI is recommended for further evaluation and to better assess to the mass effect upon the central canal and neural foramina. There is no obvious fluid collection or abscess. Effacement of the fat planes between the vertebral bodies and paraspinous musculature. IMPRESSION: 1. Interval development of L1 vertebral plana with marked bony resorption of the L1 vertebral body as well as erosive changes of the inferior T12 endplate and superior L2 endplate. Overall, the appearance and rapid progression is concerning for underlying discitis/osteomyelitis. 2. Slight retropulsion and phlegmon at the L1 level results in at least 50% narrowing of the central canal. MRI is recommended for further evaluation. 3. No evidence of paraspinal abscess on this unenhanced exam. There is effacement of the fat planes between the upper lumbar spine and paraspinous musculature. Again, MRI would be useful for further evaluation. 4. Moderate fecal retention. 5. Cirrhosis. These results were called by telephone at the time of interpretation on 01/20/2020 at 3:13 pm to provider [facility] , who verbally acknowledged these results. Electronically Signed   By: Randa Ngo  M.D.   On: 01/20/2020 15:13   MR THORACIC SPINE W WO CONTRAST  Result Date: 01/20/2020 CLINICAL DATA:  Back pain. Abdominal pain. CT scan demonstrates destruction of the L1 vertebral body. History of benign-appearing compression fracture in January 21. EXAM: MRI THORACIC WITHOUT AND WITH CONTRAST TECHNIQUE: Multiplanar and multiecho pulse sequences of the thoracic spine were obtained without and with intravenous contrast. CONTRAST:  69m GADAVIST GADOBUTROL 1 MMOL/ML IV SOLN COMPARISON:  MRI of the lumbar spine dated 01/20/2020 and CT scans dated 01/20/2020 and 11/11/2019 FINDINGS: MRI THORACIC SPINE FINDINGS Alignment:  Physiologic. Vertebrae: Severe compression fracture of L1 with abnormal signal from the T12 vertebral body with erosion/destruction of a portion of the inferior endplate of TJ62 Edema and enhancement around the facet joints at T12-L1. The remainder of the thoracic spine appears normal. Cord: The tip of the conus is at T12-L1. There is protrusion of bone and soft tissue into the spinal canal at T12-L1 with moderately severe compression of the thecal sac. Paraspinal and other soft tissues: Paraspinal phlegmon extending into the psoas muscles on the right and left with abnormal edema and enhancement of those soft tissues as well as extending into the anterior epidural space at T12-L1 and L1-2. Disc levels: The discs from C7-T1 through T11-12 are normal. T12-L1: There is no disc bulging or protrusion. There is slight edema in the expanded disc space but there is no abnormal enhancement of the disc after contrast administration. There is erosion/destruction of a small segment of the inferior endplate of TG31with destruction of the L1 vertebral body with protrusion of bone and soft tissue into the anterior epidural space as described above. Paraspinal phlegmon. IMPRESSION: 1. Severe compression fracture of T12 with protrusion of bone and soft tissue into the spinal canal with moderately severe  compression of the thecal sac. I suspect this represents an atypical infection such as tuberculosis or actinomycosis. B-cell lymphoma should be considered. The patient is afebrile and has a normal white blood count at this time. 2. Paraspinal phlegmon at T12-L1 and L1-2. Electronically Signed   By:  Lorriane Shire M.D.   On: 01/20/2020 17:50   MR Lumbar Spine W Wo Contrast  Result Date: 01/20/2020 CLINICAL DATA:  Progressive compression fracture of L1. CT scan of the abdomen dated 01/20/2020 suggests the possibility of discitis/osteomyelitis. EXAM: MRI LUMBAR SPINE WITHOUT AND WITH CONTRAST TECHNIQUE: Multiplanar and multiecho pulse sequences of the lumbar spine were obtained without and with intravenous contrast. CONTRAST:  60m GADAVIST GADOBUTROL 1 MMOL/ML IV SOLN COMPARISON:  CT scan dated 01/20/2020 and 11/15/2019 FINDINGS: Segmentation:  Standard. Alignment: Severe compression fracture of L1 with protrusion of the posterior margin of the into the spinal canal approximately 6 mm. Vertebrae: There is a severe compression fracture of L1 with fragmentation of the vertebral body. There are new erosions of the inferior endplate of TE08and of the superior endplate of L2 with abnormal enhancement of those vertebra as well as abnormal enhancement of the remnants of the L1 vertebral body. There is abnormal soft tissue surrounding the vertebral bodies from T12 through L2 with abnormal enhancement after contrast administration. The process involves the adjacent psoas muscles. There is also protrusion of bone and soft tissue into the spinal canal. There is enhancement of the protruding bone and soft tissue with moderately severe compression of the thecal sac. There is no significant abnormality of the discs at T12-L1 and L1-2. There is slight enhancement around the facet joints at T12-L1 and L1-2. Conus medullaris: Conus extends to the T12-L1 level. Paraspinal and other soft tissues: There is abnormal edema and  enhancement of the psoas muscles from T12 through L2-3 bilaterally. No discrete paraspinal abscess. There are some bone fragments in the paraspinal soft tissues within the right psoas muscle at T12-L1 level. Disc levels: T11-12: Normal. T12-L1: No disc bulging or disc protrusion. Enhancing epidural soft tissue extends into the spinal canal and compresses the thecal sac without a definable epidural abscess. Destruction of a portion of the inferior endplate of TX44 Posterior margin of the fractured and destroyed L1 vertebral body protrudes 6 mm into the spinal canal. L1-2: The disc appears normal. Abnormal enhancement of the L1 vertebral body fragments and of the superior endplate of L2 destruction of a portion of the superior endplate. Abnormal enhancing soft tissue is seen anterior to the thecal sac at L1-2 with slight symmetrical compression of the thecal sac at that level. L2-3: Tiny disc bulges into the neural foramina without neural impingement. Otherwise negative. L3-4: Small broad-based disc bulge with symmetrical slight compression of the thecal sac. The bulges extend into both neural foramina without focal neural impingement. L4-5: Small broad-based disc bulge asymmetric into the right neural foramen, touching right L4 nerve within the neural foramen. L5-S1: Small central disc bulge without neural impingement. Otherwise negative. IMPRESSION: Destruction of the L1 vertebral body with partial destruction of the inferior endplates of TY18and L2 with abnormal enhancing paraspinal and epidural soft tissues at that level. No definable abscess. I suspect this represents atypical infection such as TB or actinomycosis. B-cell lymphoma can also give this appearance. There is moderate compression of the thecal sac at L1 as described above. The patient has a history of osteomyelitis and MRSA. However, the patient's white blood count and temperature are normal at this time. Electronically Signed   By: JLorriane ShireM.D.    On: 01/20/2020 17:44    Lab Data:  CBC: Recent Labs  Lab 01/20/20 1056 01/21/20 0619 01/22/20 0534  WBC 7.6 7.7 6.5  NEUTROABS  --  5.2  --   HGB 9.8*  8.8* 8.8*  HCT 32.2* 29.1* 29.1*  MCV 84.3 84.6 83.9  PLT 231 211 628   Basic Metabolic Panel: Recent Labs  Lab 01/20/20 1056 01/21/20 0619 01/22/20 0534  NA 136 136 139  K 4.2 4.3 4.5  CL 99 102 103  CO2 28 26 28   GLUCOSE 298* 163* 127*  BUN 13 14 19   CREATININE 1.35* 1.55* 1.58*  CALCIUM 9.0 8.7* 8.9   GFR: Estimated Creatinine Clearance (by C-G formula based on SCr of 1.58 mg/dL (H)) Female: 34.9 mL/min (A) Female: 42.8 mL/min (A) Liver Function Tests: Recent Labs  Lab 01/20/20 1056 01/21/20 0619 01/22/20 0534  AST 9* 10* 10*  ALT 9 8 6   ALKPHOS 99 95 90  BILITOT 0.8 0.6 0.7  PROT 7.7 6.9 6.8  ALBUMIN 2.3* 2.1* 2.0*   Recent Labs  Lab 01/20/20 1056  LIPASE 37   No results for input(s): AMMONIA in the last 168 hours. Coagulation Profile: Recent Labs  Lab 01/22/20 0534  INR 1.2   Cardiac Enzymes: No results for input(s): CKTOTAL, CKMB, CKMBINDEX, TROPONINI in the last 168 hours. BNP (last 3 results) No results for input(s): PROBNP in the last 8760 hours. HbA1C: No results for input(s): HGBA1C in the last 72 hours. CBG: Recent Labs  Lab 01/21/20 0721 01/21/20 1124 01/21/20 1632 01/21/20 2029 01/22/20 0629  GLUCAP 132* 105* 178* 145* 112*   Lipid Profile: No results for input(s): CHOL, HDL, LDLCALC, TRIG, CHOLHDL, LDLDIRECT in the last 72 hours. Thyroid Function Tests: No results for input(s): TSH, T4TOTAL, FREET4, T3FREE, THYROIDAB in the last 72 hours. Anemia Panel: No results for input(s): VITAMINB12, FOLATE, FERRITIN, TIBC, IRON, RETICCTPCT in the last 72 hours. Urine analysis:    Component Value Date/Time   COLORURINE YELLOW 01/21/2020 0655   APPEARANCEUR TURBID (A) 01/21/2020 0655   LABSPEC 1.016 01/21/2020 0655   PHURINE 5.0 01/21/2020 0655   GLUCOSEU 150 (A) 01/21/2020  0655   GLUCOSEU 500 (A) 12/03/2019 1309   HGBUR MODERATE (A) 01/21/2020 0655   HGBUR moderate 08/05/2010 1048   BILIRUBINUR NEGATIVE 01/21/2020 0655   BILIRUBINUR neg 11/15/2017 1241   KETONESUR NEGATIVE 01/21/2020 0655   PROTEINUR 100 (A) 01/21/2020 0655   UROBILINOGEN >=8.0 (A) 12/03/2019 1309   NITRITE NEGATIVE 01/21/2020 0655   LEUKOCYTESUR LARGE (A) 01/21/2020 0655     Latamara Melder M.D. [facility] 01/22/2020, 9:41 AM   Call night coverage person covering after 7pm

## 2020-01-22 NOTE — Progress Notes (Signed)
Subjective: NAEs o/n  Objective: Vital signs in last 24 hours: Temp:  [98 F (36.7 C)-98.9 F (37.2 C)] 98.1 F (36.7 C) (03/25 0715) Pulse Rate:  [83-92] 83 (03/25 0715) Resp:  [16] 16 (03/25 0715) BP: (119-167)/(58-82) 164/79 (03/25 0715) SpO2:  [93 %-97 %] 97 % (03/25 0715)  Intake/Output from previous day: 03/24 0701 - 03/25 0700 In: 360 [P.O.:360] Out: 200 [Urine:200] Intake/Output this shift: No intake/output data recorded.  Bedridden Full strength in legs, though proximal strength limited by pain  Lab Results: Recent Labs    01/21/20 0619 01/22/20 0534  WBC 7.7 6.5  HGB 8.8* 8.8*  HCT 29.1* 29.1*  PLT 211 209   BMET Recent Labs    01/21/20 0619 01/22/20 0534  NA 136 139  K 4.3 4.5  CL 102 103  CO2 26 28  GLUCOSE 163* 127*  BUN 14 19  CREATININE 1.55* 1.58*  CALCIUM 8.7* 8.9    Studies/Results: CT ABDOMEN PELVIS WO CONTRAST  Result Date: 01/20/2020 CLINICAL DATA:  Lower abdominal pain, constipation EXAM: CT ABDOMEN AND PELVIS WITHOUT CONTRAST TECHNIQUE: Multidetector CT imaging of the abdomen and pelvis was performed following the standard protocol without IV contrast. COMPARISON:  11/15/2019 FINDINGS: Lower chest: No acute pleural or parenchymal lung disease. Hepatobiliary: Nodular contour of the liver compatible with cirrhosis, stable. Calcified granulomata are seen within the liver parenchyma. The gallbladder is surgically absent. Pancreas: Unremarkable. No pancreatic ductal dilatation or surrounding inflammatory changes. Spleen: Normal in size without focal abnormality. Adrenals/Urinary Tract: No urinary tract calculi or obstructive uropathy. The bladder is unremarkable. The adrenals are normal. Stomach/Bowel: No bowel obstruction or ileus. Moderate fecal retention. Normal appendix right lower quadrant. No bowel wall thickening or inflammatory changes. Vascular/Lymphatic: Multiple subcentimeter lymph nodes are seen within the retroperitoneum, increased in  number since prior study. No pathologically enlarged lymph nodes. Vascular calcifications are again seen throughout the aorta and its branches. Reproductive: Status post hysterectomy. No adnexal masses. Other: There is no free fluid or free gas within the abdomen or pelvis. Musculoskeletal: Since the prior exam, there is been progression of the compression deformity at L1, with marked bony resorption and development of vertebral plana. There is also cortical erosion within the superior endplate of L2 and inferior endplate of D66. Overall, the appearance is highly concerning for progressive discitis/osteomyelitis. There is mild retropulsion of the L1 vertebral body with at least 50% narrowing of the central canal at the L1 level. MRI is recommended for further evaluation and to better assess to the mass effect upon the central canal and neural foramina. There is no obvious fluid collection or abscess. Effacement of the fat planes between the vertebral bodies and paraspinous musculature. IMPRESSION: 1. Interval development of L1 vertebral plana with marked bony resorption of the L1 vertebral body as well as erosive changes of the inferior T12 endplate and superior L2 endplate. Overall, the appearance and rapid progression is concerning for underlying discitis/osteomyelitis. 2. Slight retropulsion and phlegmon at the L1 level results in at least 50% narrowing of the central canal. MRI is recommended for further evaluation. 3. No evidence of paraspinal abscess on this unenhanced exam. There is effacement of the fat planes between the upper lumbar spine and paraspinous musculature. Again, MRI would be useful for further evaluation. 4. Moderate fecal retention. 5. Cirrhosis. These results were called by telephone at the time of interpretation on 01/20/2020 at 3:13 pm to provider [facility] , who verbally acknowledged these results. Electronically Signed   By: Legrand Como  Owens Shark M.D.   On: 01/20/2020 15:13   MR THORACIC  SPINE W WO CONTRAST  Result Date: 01/20/2020 CLINICAL DATA:  Back pain. Abdominal pain. CT scan demonstrates destruction of the L1 vertebral body. History of benign-appearing compression fracture in January 21. EXAM: MRI THORACIC WITHOUT AND WITH CONTRAST TECHNIQUE: Multiplanar and multiecho pulse sequences of the thoracic spine were obtained without and with intravenous contrast. CONTRAST:  38m GADAVIST GADOBUTROL 1 MMOL/ML IV SOLN COMPARISON:  MRI of the lumbar spine dated 01/20/2020 and CT scans dated 01/20/2020 and 11/11/2019 FINDINGS: MRI THORACIC SPINE FINDINGS Alignment:  Physiologic. Vertebrae: Severe compression fracture of L1 with abnormal signal from the T12 vertebral body with erosion/destruction of a portion of the inferior endplate of TJ57 Edema and enhancement around the facet joints at T12-L1. The remainder of the thoracic spine appears normal. Cord: The tip of the conus is at T12-L1. There is protrusion of bone and soft tissue into the spinal canal at T12-L1 with moderately severe compression of the thecal sac. Paraspinal and other soft tissues: Paraspinal phlegmon extending into the psoas muscles on the right and left with abnormal edema and enhancement of those soft tissues as well as extending into the anterior epidural space at T12-L1 and L1-2. Disc levels: The discs from C7-T1 through T11-12 are normal. T12-L1: There is no disc bulging or protrusion. There is slight edema in the expanded disc space but there is no abnormal enhancement of the disc after contrast administration. There is erosion/destruction of a small segment of the inferior endplate of TS17with destruction of the L1 vertebral body with protrusion of bone and soft tissue into the anterior epidural space as described above. Paraspinal phlegmon. IMPRESSION: 1. Severe compression fracture of T12 with protrusion of bone and soft tissue into the spinal canal with moderately severe compression of the thecal sac. I suspect this  represents an atypical infection such as tuberculosis or actinomycosis. B-cell lymphoma should be considered. The patient is afebrile and has a normal white blood count at this time. 2. Paraspinal phlegmon at T12-L1 and L1-2. Electronically Signed   By: JLorriane ShireM.D.   On: 01/20/2020 17:50   MR Lumbar Spine W Wo Contrast  Result Date: 01/20/2020 CLINICAL DATA:  Progressive compression fracture of L1. CT scan of the abdomen dated 01/20/2020 suggests the possibility of discitis/osteomyelitis. EXAM: MRI LUMBAR SPINE WITHOUT AND WITH CONTRAST TECHNIQUE: Multiplanar and multiecho pulse sequences of the lumbar spine were obtained without and with intravenous contrast. CONTRAST:  838mGADAVIST GADOBUTROL 1 MMOL/ML IV SOLN COMPARISON:  CT scan dated 01/20/2020 and 11/15/2019 FINDINGS: Segmentation:  Standard. Alignment: Severe compression fracture of L1 with protrusion of the posterior margin of the into the spinal canal approximately 6 mm. Vertebrae: There is a severe compression fracture of L1 with fragmentation of the vertebral body. There are new erosions of the inferior endplate of [id number] of the superior endplate of L2 with abnormal enhancement of those vertebra as well as abnormal enhancement of the remnants of the L1 vertebral body. There is abnormal soft tissue surrounding the vertebral bodies from T12 through L2 with abnormal enhancement after contrast administration. The process involves the adjacent psoas muscles. There is also protrusion of bone and soft tissue into the spinal canal. There is enhancement of the protruding bone and soft tissue with moderately severe compression of the thecal sac. There is no significant abnormality of the discs at T12-L1 and L1-2. There is slight enhancement around the facet joints at T12-L1 and L1-2. Conus  medullaris: Conus extends to the T12-L1 level. Paraspinal and other soft tissues: There is abnormal edema and enhancement of the psoas muscles from T12 through L2-3  bilaterally. No discrete paraspinal abscess. There are some bone fragments in the paraspinal soft tissues within the right psoas muscle at T12-L1 level. Disc levels: T11-12: Normal. T12-L1: No disc bulging or disc protrusion. Enhancing epidural soft tissue extends into the spinal canal and compresses the thecal sac without a definable epidural abscess. Destruction of a portion of the inferior endplate of E15. Posterior margin of the fractured and destroyed L1 vertebral body protrudes 6 mm into the spinal canal. L1-2: The disc appears normal. Abnormal enhancement of the L1 vertebral body fragments and of the superior endplate of L2 destruction of a portion of the superior endplate. Abnormal enhancing soft tissue is seen anterior to the thecal sac at L1-2 with slight symmetrical compression of the thecal sac at that level. L2-3: Tiny disc bulges into the neural foramina without neural impingement. Otherwise negative. L3-4: Small broad-based disc bulge with symmetrical slight compression of the thecal sac. The bulges extend into both neural foramina without focal neural impingement. L4-5: Small broad-based disc bulge asymmetric into the right neural foramen, touching right L4 nerve within the neural foramen. L5-S1: Small central disc bulge without neural impingement. Otherwise negative. IMPRESSION: Destruction of the L1 vertebral body with partial destruction of the inferior endplates of A30 and L2 with abnormal enhancing paraspinal and epidural soft tissues at that level. No definable abscess. I suspect this represents atypical infection such as TB or actinomycosis. B-cell lymphoma can also give this appearance. There is moderate compression of the thecal sac at L1 as described above. The patient has a history of osteomyelitis and MRSA. However, the patient's white blood count and temperature are normal at this time. Electronically Signed   By: Lorriane Shire M.D.   On: 01/20/2020 17:44    Assessment/Plan: L1  collapse - plan for OR today   Vallarie Mare 01/22/2020, 10:41 AM

## 2020-01-22 NOTE — Transfer of Care (Signed)
Immediate Anesthesia Transfer of Care Note  Patient: Arlys Scatena  Procedure(s) Performed: Lumbar One LATERAL CORPECTOMY AND RECONSTRUCTION WITH CAGE; Thoracic Eleven- Lumbar Three posterior instrumented fusion; Mazor Robot (N/A Spine Lumbar) Thoracic Eleven-Lumbar Three POSTERIOR INSTRUMENTED FUSION (N/A Spine Lumbar) APPLICATION OF ROBOTIC ASSISTANCE FOR SPINAL PROCEDURE (N/A )  Patient Location: PACU  Anesthesia Type:General  Level of Consciousness: awake and alert   Airway & Oxygen Therapy: Patient connected to face mask oxygen  Post-op Assessment: Report given to RN and Post -op Vital signs reviewed and stable  Post vital signs: Reviewed and stable  Last Vitals:  Vitals Value Taken Time  BP 153/98 01/22/20 2027  Temp    Pulse 98 01/22/20 2030  Resp 9 01/22/20 2030  SpO2 100 % 01/22/20 2030  Vitals shown include unvalidated device data.  Last Pain:  Vitals:   01/22/20 1255  TempSrc:   PainSc: 0-No pain      Patients Stated Pain Goal: 2 (71/85/50 1586)  Complications: No apparent anesthesia complications

## 2020-01-23 ENCOUNTER — Inpatient Hospital Stay (HOSPITAL_COMMUNITY): Payer: PRIVATE HEALTH INSURANCE

## 2020-01-23 LAB — BASIC METABOLIC PANEL
Anion gap: 13 (ref 5–15)
BUN: 22 mg/dL (ref 8–23)
CO2: 23 mmol/L (ref 22–32)
Calcium: 8.6 mg/dL — ABNORMAL LOW (ref 8.9–10.3)
Chloride: 100 mmol/L (ref 98–111)
Creatinine, Ser: 1.67 mg/dL — ABNORMAL HIGH (ref 0.44–1.00)
GFR calc Af Amer: 37 mL/min — ABNORMAL LOW (ref >60)
GFR calc non Af Amer: 32 mL/min — ABNORMAL LOW (ref >60)
Glucose, Bld: 302 mg/dL — ABNORMAL HIGH (ref 70–99)
Potassium: 4.5 mmol/L (ref 3.5–5.1)
Sodium: 136 mmol/L (ref 135–145)

## 2020-01-23 LAB — ACID FAST SMEAR (AFB, MYCOBACTERIA)
Acid Fast Smear: NEGATIVE
Acid Fast Smear: NEGATIVE
Source (AFB): 2

## 2020-01-23 LAB — GLUCOSE, CAPILLARY
Glucose-Capillary: 249 mg/dL — ABNORMAL HIGH (ref 70–99)
Glucose-Capillary: 228 mg/dL — ABNORMAL HIGH (ref 70–99)
Glucose-Capillary: 223 mg/dL — ABNORMAL HIGH (ref 70–99)
Glucose-Capillary: 202 mg/dL — ABNORMAL HIGH (ref 70–99)

## 2020-01-23 LAB — POCT I-STAT 7, (LYTES, BLD GAS, ICA,H+H)
Acid-Base Excess: 1 mmol/L (ref 0.0–2.0)
Acid-Base Excess: 2 mmol/L (ref 0.0–2.0)
Acid-Base Excess: 1 mmol/L (ref 0.0–2.0)
Bicarbonate: 26.3 mmol/L (ref 20.0–28.0)
Bicarbonate: 27 mmol/L (ref 20.0–28.0)
Bicarbonate: 25.8 mmol/L (ref 20.0–28.0)
Calcium, Ion: 1.21 mmol/L (ref 1.15–1.40)
Calcium, Ion: 1.22 mmol/L (ref 1.15–1.40)
Calcium, Ion: 1.2 mmol/L (ref 1.15–1.40)
HCT: 24 % — ABNORMAL LOW (ref 36.0–46.0)
HCT: 24 % — ABNORMAL LOW (ref 36.0–46.0)
HCT: 27 % — ABNORMAL LOW (ref 36.0–46.0)
Hemoglobin: 8.2 g/dL — ABNORMAL LOW (ref 12.0–15.0)
Hemoglobin: 8.2 g/dL — ABNORMAL LOW (ref 12.0–15.0)
Hemoglobin: 9.2 g/dL — ABNORMAL LOW (ref 12.0–15.0)
O2 Saturation: 100 %
O2 Saturation: 100 %
O2 Saturation: 100 %
Patient temperature: 36.4
Patient temperature: 36.8
Patient temperature: 36.8
Potassium: 3.8 mmol/L (ref 3.5–5.1)
Potassium: 4.4 mmol/L (ref 3.5–5.1)
Potassium: 4.8 mmol/L (ref 3.5–5.1)
Sodium: 140 mmol/L (ref 135–145)
Sodium: 139 mmol/L (ref 135–145)
Sodium: 139 mmol/L (ref 135–145)
TCO2: 28 mmol/L (ref 22–32)
TCO2: 28 mmol/L (ref 22–32)
TCO2: 27 mmol/L (ref 22–32)
pCO2 arterial: 43.4 mmHg (ref 32.0–48.0)
pCO2 arterial: 41.7 mmHg (ref 32.0–48.0)
pCO2 arterial: 41.9 mmHg (ref 32.0–48.0)
pH, Arterial: 7.388 (ref 7.350–7.450)
pH, Arterial: 7.419 (ref 7.350–7.450)
pH, Arterial: 7.398 (ref 7.350–7.450)
pO2, Arterial: 217 mmHg — ABNORMAL HIGH (ref 83.0–108.0)
pO2, Arterial: 164 mmHg — ABNORMAL HIGH (ref 83.0–108.0)
pO2, Arterial: 204 mmHg — ABNORMAL HIGH (ref 83.0–108.0)

## 2020-01-23 LAB — CBC
HCT: 29.7 % — ABNORMAL LOW (ref 36.0–46.0)
Hemoglobin: 9.2 g/dL — ABNORMAL LOW (ref 12.0–15.0)
MCH: 26.3 pg (ref 26.0–34.0)
MCHC: 31 g/dL (ref 30.0–36.0)
MCV: 84.9 fL (ref 80.0–100.0)
Platelets: 204 10*3/uL (ref 150–400)
RBC: 3.5 MIL/uL — ABNORMAL LOW (ref 3.87–5.11)
RDW: 14.4 % (ref 11.5–15.5)
WBC: 10.6 10*3/uL — ABNORMAL HIGH (ref 4.0–10.5)
nRBC: 0 % (ref 0.0–0.2)

## 2020-01-23 MED ORDER — ACETAMINOPHEN 325 MG PO TABS
650.0000 mg | ORAL_TABLET | Freq: Four times a day (QID) | ORAL | Status: DC | PRN
  Administered 2020-01-23 – 2020-01-24 (×2): 650 mg via ORAL
  Filled 2020-01-23 (×2): qty 2

## 2020-01-23 MED ORDER — RIFAMPIN 300 MG PO CAPS
600.0000 mg | ORAL_CAPSULE | Freq: Every day | ORAL | Status: DC
  Administered 2020-01-23 – 2020-01-24 (×2): 600 mg via ORAL
  Filled 2020-01-23 (×2): qty 2

## 2020-01-23 MED FILL — Thrombin For Soln 5000 Unit: CUTANEOUS | Qty: 5000 | Status: AC

## 2020-01-23 NOTE — Progress Notes (Signed)
Subjective: Patient reports no neurologic symptoms, persistent back pain  Objective: Vital signs in last 24 hours: Temp:  [97.8 F (36.6 C)-98.4 F (36.9 C)] 98.3 F (36.8 C) (03/26 0829) Pulse Rate:  [88-102] 92 (03/26 0829) Resp:  [9-22] 16 (03/26 0829) BP: (140-197)/(65-98) 140/65 (03/26 0829) SpO2:  [89 %-100 %] 100 % (03/26 0829)  Intake/Output from previous day: 03/25 0701 - 03/26 0700 In: 3005 [P.O.:440; I.V.:1800; Blood:315; IV Piggyback:450] Out: 1620 [Urine:1070; Blood:550] Intake/Output this shift: Total I/O In: 120 [P.O.:120] Out: -   5/5 strength DF, PF, KE.  Severe pain limitation in HF   Lab Results: Recent Labs    01/22/20 0534 01/22/20 1503 01/22/20 1822 01/23/20 0323  WBC 6.5  --   --  10.6*  HGB 8.8*   < > 9.2* 9.2*  HCT 29.1*   < > 27.0* 29.7*  PLT 209  --   --  204   < > = values in this interval not displayed.   BMET Recent Labs    01/22/20 0534 01/22/20 1503 01/22/20 1822 01/23/20 0323  NA 139   < > 139 136  K 4.5   < > 4.8 4.5  CL 103  --   --  100  CO2 28  --   --  23  GLUCOSE 127*  --   --  302*  BUN 19  --   --  22  CREATININE 1.58*  --   --  1.67*  CALCIUM 8.9  --   --  8.6*   < > = values in this interval not displayed.    Studies/Results: DG Thoracic Spine 2 View  Result Date: 01/23/2020 CLINICAL DATA:  Fusion of spine, thoraco lung in. EXAM: THORACIC SPINE 2 VIEWS COMPARISON:  Thoracic spine MRI 01/20/2020 FINDINGS: A right IJ approach central venous catheter appears to terminate in the region of the right brachiocephalic vein. Surgical clips within the right upper quadrant of the abdomen. Vertebral body height is maintained. Partially imaged thoracolumbar posterior spinal fusion construct spanning the T11-L3 levels with L1 corpectomy cage. The fusion construct is not demonstrate any adverse features. No new compression deformity. IMPRESSION: T11-L3 posterior spinal fusion construct with L1 corpectomy cage. The fusion construct  demonstrates no adverse features. No new thoracic compression deformity. A right IJ approach central venous catheter appears to terminate in the region of the right brachiocephalic vein. Correlate with desired positioning. Electronically Signed   By: Kellie Simmering DO   On: 01/23/2020 07:42   DG Lumbar Spine 2-3 Views  Result Date: 01/23/2020 CLINICAL DATA:  Fusion of spine, thoracolumbar region. EXAM: LUMBAR SPINE - 2-3 VIEW COMPARISON:  Lumbar spine MRI 01/20/2020 FINDINGS: Surgical clips within the right upper quadrant of the abdomen. Posterior spinal fusion construct spanning the T11-L3 levels with L1 corpectomy cage. The fusion construct demonstrates no adverse features. Redemonstrated irregularity of the L2 superior endplate with mild height loss. No new compression deformity. IMPRESSION: T11-L3 posterior spinal fusion construct with L1 corpectomy cage. The fusion construct demonstrates no adverse features. Redemonstrated irregularity of the L2 superior endplate with mild height loss. No new lumbar compression deformity. Electronically Signed   By: Kellie Simmering DO   On: 01/23/2020 07:43   DG Lumbar Spine 2-3 Views  Result Date: 01/22/2020 CLINICAL DATA:  Lumbar spine surgery. EXAM: LUMBAR SPINE - 2-3 VIEW; DG C-ARM 1-60 MIN COMPARISON:  CT 11/15/2019 and MRI 01/20/2020 FINDINGS: Eleven spot intraoperative images of the lumbar spine demonstrate placement of a spacer over  the previous site of patient's known L1 compression fracture with associated posterior fusion hardware from T11 to at least L3 but possibly extending below the L3 level. Hardware is intact. There is normal alignment of the visualized thoracolumbar spine. IMPRESSION: Intraoperative findings as described with posterior fusion hardware intact from T11 to at least the L3 level bridging a spacer device at the L1 level. Recommend correlation with findings at the time of the procedure. Electronically Signed   By: Marin Olp M.D.   On:  01/22/2020 20:27   DG Chest Port 1 View  Result Date: 01/22/2020 CLINICAL DATA:  64 year old female line placement. Status post thoracolumbar surgery, suspected spinal infection. EXAM: PORTABLE CHEST 1 VIEW COMPARISON:  Chest radiographs 11/12/2019 and earlier. FINDINGS: Portable AP semi upright view at 2040 hours. Right IJ approach central line placed. Tip at the cavoatrial junction. Mediastinal contours remain normal. Low lung volumes. No pneumothorax. Allowing for portable technique the lungs are clear. New lower thoracic and upper lumbar spinal hardware partially visible. Stable cholecystectomy clips. Paucity of bowel gas. Chronic osseous deformities about the left shoulder. IMPRESSION: 1. Right IJ central line placed, tip at the cavoatrial junction. No acute cardiopulmonary abnormality. 2. Partially visible new thoracolumbar spine hardware. Electronically Signed   By: Genevie Ann M.D.   On: 01/22/2020 21:01   DG C-Arm 1-60 Min  Result Date: 01/22/2020 CLINICAL DATA:  Lumbar spine surgery. EXAM: LUMBAR SPINE - 2-3 VIEW; DG C-ARM 1-60 MIN COMPARISON:  CT 11/15/2019 and MRI 01/20/2020 FINDINGS: Eleven spot intraoperative images of the lumbar spine demonstrate placement of a spacer over the previous site of patient's known L1 compression fracture with associated posterior fusion hardware from T11 to at least L3 but possibly extending below the L3 level. Hardware is intact. There is normal alignment of the visualized thoracolumbar spine. IMPRESSION: Intraoperative findings as described with posterior fusion hardware intact from T11 to at least the L3 level bridging a spacer device at the L1 level. Recommend correlation with findings at the time of the procedure. Electronically Signed   By: Marin Olp M.D.   On: 01/22/2020 20:27    Assessment/Plan: POD   POD #1 s/p L1 corpectomy and reconstruction with expandable cage, T11-L3 percutaneous posterior instrumentation - f/u cultures but will need empiric  antibiotics with broad coverage for now - given osteopenia and likely malnutrition, will need vit D/Calcium levels - OOB with TLSO brace, PT with brace on - glucose control - dressings off Saturday or Sunday, steri-strips to remain until they fall off.  Okay to shower Sunday.  [name] [name] 01/23/2020, 11:15 AM

## 2020-01-23 NOTE — Evaluation (Signed)
Physical Therapy Evaluation Patient Details Name: [name] [name] MRN: [id number] DOB: 16-Jul-1956 Today's Date: 01/23/2020   History of Present Illness  [name] [name] is a 64 y.o. adult with PMH signficant for diabetes mellitus type 2, chronic kidney disease stage III,  anemia, HTN, anxiety, GERD, fibromyalgia, depression, PTSD, and is legally blind. She was admitted for L1 osteomyelitis and spinal instability. S/p debridement and stabilization T11-12 & L2-3 (3/25)     Clinical Impression  Patient presented supine in bed, awake, and reporting high level of pain in lumbar region and posterior LE's. PTA, pt required max-total assistance from her husband and/or aide with mobility and ADL's as she reports she is legally blind. Pt lives in a single level home with 2 steps to enter, though required EMS to get in/out. She reports she has not ambulated for about 3 weeks due to back pain. She has been stand-pivot transferring to bedside commode with assistance. She reports having a RW and Hoveround scooter. Today's evaluation was limited due to patients high level of pain. RN administered medication during session. She was able to roll onto her R side twice with minA and use of bed rails. She declined further bed mobility or transfer attempts due to anxiety and pain. Will likely require +2 assistance to get EOB and for transfers. She tolerated HOB at 30deg angle and laying on her R side to slightly relieve pain. Educated verbally on back precautions and importance of moving. Given handout for husband/aide to review. Hopeful that pt will tolerate additional bed mobility and transfers with improved pain control. Currently recommend SNF to address impairments and maximize functional mobility, safety, and independence.   Pt would continue to benefit from skilled physical therapy services at this time while admitted and after d/c to address the below listed limitations in order to improve overall safety and  independence with functional mobility.     Follow Up Recommendations SNF;Supervision/Assistance - 24 hour    Equipment Recommendations  (Pt has DME)    Recommendations for Other Services       Precautions / Restrictions Precautions Precautions: Back;Fall Precaution Booklet Issued: Yes (comment) Precaution Comments: patient cannot read, therefore verbally educated on precautions. Handout left for husband Required Braces or Orthoses: Spinal Brace Spinal Brace: Thoracolumbosacral orthotic Restrictions Weight Bearing Restrictions: No      Mobility  Bed Mobility Overal bed mobility: Needs Assistance Bed Mobility: Rolling Rolling: Min assist         General bed mobility comments: Pt able to slowly roll onto R side with min A and bedrails x2. Pt declined additional bed mobility due to pain  Transfers Overall transfer level: (deferred due to pain)                  Ambulation/Gait                Stairs            Wheelchair Mobility    Modified Rankin (Stroke Patients Only)       Balance                                             Pertinent Vitals/Pain Pain Assessment: Faces Faces Pain Scale: Hurts worst Pain Location: Lumbar region and posterior LE's Pain Descriptors / Indicators: Aching;Discomfort;Operative site guarding;Sharp Pain Intervention(s): Limited activity within patient's tolerance;RN gave pain meds during session;Monitored  during session    [place] expects to be discharged to:: Private residence Living Arrangements: Spouse/significant other Available Help at Discharge: Family;Available PRN/intermittently(Aide available 16hrs/day Mon-Fri) Type of Home: House Home Access: Stairs to enter   [employer] of Steps: 2 Home Layout: One level Home Equipment: Walker - 2 wheels;Bedside commode;Electric scooter      Prior Function Level of Independence: Needs assistance   Gait /  Transfers Assistance Needed: Has not been ambulating for past 3 weeks. Has been doing stand pivot transfers from bed to bedside commode or chair w/ assist from husband and/or aide.   ADL's / Homemaking Assistance Needed: Requires mod-max assistance for feeding, bathing, and dressing.  Comments: Legally blind- per pt     Hand Dominance        Extremity/Trunk Assessment        Lower Extremity Assessment Lower Extremity Assessment: Generalized weakness(Difficult to assess due to pain)       Communication   Communication: No difficulties  Cognition Arousal/Alertness: Awake/alert Behavior During Therapy: Anxious;Agitated Overall Cognitive Status: Within Functional Limits for tasks assessed                                 General Comments: Repeatedly asked about husbands whereabouts and was told he would return at 10am- anxious      General Comments      Exercises     Assessment/Plan    PT Assessment Patient needs continued PT services  PT Problem List Decreased strength;Decreased range of motion;Decreased activity tolerance;Decreased mobility;Decreased coordination;Decreased knowledge of use of DME;Decreased knowledge of precautions       PT Treatment Interventions DME instruction;Gait training;Functional mobility training;Therapeutic activities;Therapeutic exercise;Balance training;Patient/family education;Stair training    PT Goals (Current goals can be found in the Care Plan section)  Acute Rehab PT Goals Patient Stated Goal: to sit up PT Goal Formulation: With patient Time For Goal Achievement: 02/06/20 Potential to Achieve Goals: Fair    Frequency Min 5X/week   Barriers to discharge        Co-evaluation               AM-PAC PT "6 Clicks" Mobility  Outcome Measure Help needed turning from your back to your side while in a flat bed without using bedrails?: A Lot Help needed moving from lying on your back to sitting on the side of a  flat bed without using bedrails?: Total Help needed moving to and from a bed to a chair (including a wheelchair)?: Total Help needed standing up from a chair using your arms (e.g., wheelchair or bedside chair)?: Total Help needed to walk in hospital room?: Total Help needed climbing 3-5 steps with a railing? : Total 6 Click Score: 7    End of Session   Activity Tolerance: Patient limited by pain;Treatment limited secondary to agitation Patient left: in bed;with bed alarm set;with call bell/phone within reach Nurse Communication: Mobility status PT Visit Diagnosis: Muscle weakness (generalized) (M62.81);Other abnormalities of gait and mobility (R26.89);Pain Pain - Right/Left: (Back- lumbar) Pain - part of body: Cipriano Mile)    Time: 9741-6384 PT Time Calculation (min) (ACUTE ONLY): 28 min   Charges:   PT Evaluation $PT Eval Moderate Complexity: 1 Mod PT Treatments $Therapeutic Activity: 8-22 mins      Dequincy Born, SPT Acute Rehab  [phone]   Lashawnda Hancox 01/23/2020, 1:57 PM

## 2020-01-23 NOTE — Progress Notes (Addendum)
Inpatient Diabetes Program Recommendations  AACE/ADA: New Consensus Statement on Inpatient Glycemic Control (2015)  Target Ranges:  Prepandial:   less than 140 mg/dL      Peak postprandial:   less than 180 mg/dL (1-2 hours)      Critically ill patients:  140 - 180 mg/dL   Lab Results  Component Value Date   GLUCAP 249 (H) 01/23/2020   HGBA1C 12.5 (H) 11/16/2019    Review of Glycemic Control Results for [name] [name], [name] [name] (MRN [id number]) as of 01/23/2020 08:51  Ref. Range 01/22/2020 11:30 01/22/2020 20:29 01/23/2020 06:48  Glucose-Capillary Latest Ref Range: 70 - 99 mg/dL 131 (H) 186 (H) 249 (H)   Diabetes history: Type 2 DM Outpatient Diabetes medications: Humalog 13 units QD, Lantus 20 units QAM, 40 units QHS Current orders for Inpatient glycemic control: Lantus 20 units QAM, 30 units QHS, Novolog 0-9 units TID Decadron 10 mg x 1  Inpatient Diabetes Program Recommendations:    Anticipate glucose trends may be increased today as patient missed QHS dose of Lantus yesterday due to OR and also received Decadron 10 mg x 1.  Will plan to see patient today.   Addendum: Spoke and spouse with patient regarding diabetes management. Spouse reports frequently holding Lantus at home because of lack of oral intake and fear of going low. Reviewed patient's current A1c of 12.5%. Explained what a A1c is and what it measures. Also reviewed goal A1c with patient, importance of good glucose control @ home, and blood sugar goals. Reviewed patho of DM, need for insulin, role of pancreas, vascular changes, risk for infection with poor glycemic control, and commorbidities.  Patient has a meter and supplies, however, rarely checks blood sugar. Reason given was because of "poor blood return". Encouraged to be checking at least 2 times per day, especially with poor oral intake and may want to check occasionally 2 hours after a snack/intake to see elevation to blood sugar. Reviewed measures that may help with finger  sticking such as increasing gauge of lancets and warming hands prior. Denies drinking sugary beverages, however, ice cream and other snacks at the bedside. Encouraged the importance of mindfulness and working to ensure she continues to limit beverages. Per husband, "I fear giving her the Lantus because she is not eating well and is on the low side less than 100 mg/dL." Discussed the need to have a sick day plan and discussed role of outpatient endocrinology. List attached. Education provided on long acting insulin, action time and that usually dosages are given irregardless of oral intake if dosages are correct. Reviewed when to call MD and to work on checking CBGs more frequently. However, may need to be followed more closely. Reviewed sick day rules. Patient has no further questions at this time.     Thanks, Bronson Curb, MSN, RNC-OB Diabetes Coordinator [phone] (8a-5p)

## 2020-01-23 NOTE — TOC Initial Note (Signed)
Transition of Care [facility]) - Initial/Assessment Note    Patient Details  Name: [name] [name] MRN: [id number] Date of Birth: 1956/08/28  [facility]) CM/SW Contact:    Curlene Labrum, RN Phone Number: 01/23/2020, 3:02 PM  Clinical Narrative:                  Beverlee Nims underwent debridement and thoracolumbar fusion from T11-L3 yesterday.  Her operative Gram stain shows gram-positive cocci and cultures are pending per Dr. Zachery Conch infectious disease note - he continue IV vancomycin along with rifampin given the presence of new hardware.  Dr. Bridget Hartshorn will discontinue levofloxacin and order PICC placement.  Case Management met with the patient and husband at the bedside and patient lives with her spouse at home in [place]. She currently has all DME equipment at home including a RW and 3:1 commode.  Patient is currently being followed by Carolynn Sayers, RNCM with IV infusion company, [facility].  Patient will either home health RN, PT, OT or SNF workup depending on patient's ability to progress with PT.  Will follow PT note to establish best discharge plan.  Patient's husband has no preference for discharge plans for home at this time considering need for mobility and IV antibiotics.       Patient Goals and CMS Choice        Expected Discharge Plan and Services                                                Prior Living Arrangements/Services                       Activities of Daily Living Home Assistive Devices/Equipment: Electric scooter ADL Screening (condition at time of admission) Patient's cognitive ability adequate to safely complete daily activities?: Yes Is the patient deaf or have difficulty hearing?: No Does the patient have difficulty seeing, even when wearing glasses/contacts?: Yes Does the patient have difficulty concentrating, remembering, or making decisions?: No Patient able to express need for assistance with ADLs?: No Does the  patient have difficulty dressing or bathing?: Yes Independently performs ADLs?: No Communication: Needs assistance Is this a change from baseline?: Pre-admission baseline Does the patient have difficulty walking or climbing stairs?: Yes Weakness of Legs: Both Weakness of Arms/Hands: Both  Permission Sought/Granted                  Emotional Assessment              Admission diagnosis:  Paraspinal mass [R22.2] Discitis, unspecified spinal region [M46.40] Lumbar discitis [M46.46] Spinal stenosis of lumbar region [M48.061] Patient Active Problem List   Diagnosis Date Noted  . Spinal stenosis of lumbar region 01/22/2020  . Osteomyelitis of vertebra of thoracolumbar region ([place]) 01/21/2020  . Paraspinal mass 01/20/2020  . Back pain 11/17/2019  . Compression fracture of L1 lumbar vertebra ([place]) 11/17/2019  . Fracture of multiple ribs 11/17/2019  . Intractable nausea and vomiting 11/16/2019  . ARF (acute renal failure) ([place]) 11/15/2019  . Nausea & vomiting 11/15/2019  . Pressure injury of skin 04/17/2019  . Septic arthritis of elbow, right ([place]) 04/17/2019  . 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 Sleep apnea 11/16/2017  . Diabetic foot infection ([place]) 04/06/2017  . Type 2 diabetes mellitus with hyperglycemia, with long-term current  use of insulin ([place]) 04/06/2017  . Ulcer of left heel, limited to breakdown of skin ([place]) 02/01/2017  . Neuropathy 05/05/2015  . Diabetic peripheral neuropathy ([place]) 01/12/2015  . CKD (chronic kidney disease), stage III ([place]) 05/27/2014  . History of MI (myocardial infarction) 10/06/2013  . Nausea and vomiting 10/06/2013  . Obesity (BMI 30-39.9) 09/20/2013  . 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 Normocytic anemia 02/03/2013  . CAD (coronary artery disease) 01/11/2012  . Hepatic cirrhosis ([place]) 07/06/2010  . 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  . Hyperlipidemia 06/03/2008  . Essential hypertension 12/23/2006   PCP:   Ann Held, DO Pharmacy:   [facility] [name] [place], [place] - [address] [place] [address] Phone: [phone] Fax: [id number] - [place], [place] - [address] [place] [address] Phone: [phone] Fax: [place], [place] [place] [address] Phone: [phone] Fax: [phone]     Social Determinants of Health (SDOH) Interventions    Readmission Risk Interventions Readmission Risk Prevention Plan 01/23/2020 11/18/2019  Transportation Screening Complete Complete  Medication Review Press photographer) Complete Referral to Pharmacy  PCP or Specialist appointment within 3-5 days of discharge Complete Complete  HRI or Home Care Consult Complete Complete  SW Recovery Care/Counseling Consult Complete Complete  Palliative Care Screening Complete Not [place] Complete Not Applicable  Some recent data might be hidden

## 2020-01-23 NOTE — Addendum Note (Signed)
Addendum  created 01/23/20 0659 by Valetta Fuller, CRNA   Intraprocedure Event edited

## 2020-01-23 NOTE — Progress Notes (Signed)
CRITICAL VALUE ALERT  Critical Value:  Gram negative in lumbar tissue  Date & Time Notied:  01/23/20 @ 1735  Provider Notified: Marylu Lund, MD  Orders Received/Actions taken: No new orders received.

## 2020-01-23 NOTE — Progress Notes (Signed)
[name], adult   DOB: 07/24/1956, 64 y.o.   MRN: [id number]          [facility] for Infectious Disease    Date of Admission:  01/20/2020   Day 1 vancomycin        Day 1 levofloxacin  [name] [name] underwent debridement and thoracolumbar fusion from T11-L3 yesterday.  Her operative Gram stain shows gram-positive cocci and cultures are pending.  I will continue IV vancomycin along with rifampin given the presence of new hardware.  I will discontinue levofloxacin and order PICC placement.  [name] [name] is currently sleeping soundly.  I reviewed these plans with her husband.  Please call Dr. Tommy Medal [phone]) for any infectious disease questions this weekend.         Michel Bickers, MD [facility] for [facility] [phone] pager   [phone] cell 01/23/2020, 1:39 PM

## 2020-01-23 NOTE — Evaluation (Signed)
Occupational Therapy Evaluation Patient Details Name: [name] [name] MRN: [id number] DOB: October 19, 1956 Today's Date: 01/23/2020    History of Present Illness [name] [name] is a 64 y.o. adult with PMH signficant for diabetes mellitus type 2, chronic kidney disease stage III,  anemia, HTN, anxiety, GERD, fibromyalgia, depression, PTSD, and is legally blind. She was admitted for L1 osteomyelitis and spinal instability. S/p debridement and stabilization T11-12 & L2-3 (3/25)    Clinical Impression   Pt admitted with above. She demonstrates the below listed deficits and will benefit from continued OT to maximize safety and independence with BADLs.  Pt very limited by 10/10 back pain.  She was only able to tolerate rolling partially in bed with min A.  She requires mod - max A for self feeding and grooming activities and max - total A for the remainder of ADLs.  She moves very cautiously and slowly.  She lives with her spouse who provides a significant amount of assist PTA.  Anticipate she will require SNF level rehab.       Follow Up Recommendations  SNF;Supervision/Assistance - 24 hour    Equipment Recommendations  None recommended by OT    Recommendations for Other Services       Precautions / Restrictions Precautions Precautions: Back;Fall Precaution Booklet Issued: Yes (comment) Precaution Comments: Pt instructed in back precautions  Required Braces or Orthoses: Spinal Brace Spinal Brace: Thoracolumbosacral orthotic Restrictions Weight Bearing Restrictions: No      Mobility Bed Mobility Overal bed mobility: Needs Assistance Bed Mobility: Rolling Rolling: Min assist         General bed mobility comments: Pt positioned in partial Rt sidelying.  With increased time and effort, she was able to roll to her back and back over to her Rt.  She was unable to roll to the Lt   Transfers Overall transfer level: (deferred due to pain)               General transfer comment:  unable to attempt due to pain     Balance                                           ADL either performed or assessed with clinical judgement   ADL Overall ADL's : Needs assistance/impaired Eating/Feeding: Bed level;Maximal assistance Eating/Feeding Details (indicate cue type and reason): limited by pain  Grooming: Wash/dry hands;Wash/dry face;Set up;Bed level   Upper Body Bathing: Maximal assistance;Bed level   Lower Body Bathing: Total assistance;Bed level   Upper Body Dressing : Total assistance;Bed level   Lower Body Dressing: Total assistance;Bed level   Toilet Transfer: Total assistance Toilet Transfer Details (indicate cue type and reason): unable to attempt  Toileting- Clothing Manipulation and Hygiene: Total assistance;Bed level       Functional mobility during ADLs: Total assistance General ADL Comments: Pt significiantly limited by pain      Vision Baseline Vision/History: Legally blind Patient Visual Report: No change from baseline       Perception     Praxis      Pertinent Vitals/Pain Pain Assessment: 0-10 Pain Score: 10-Worst pain ever Pain Location: Lumbar region and posterior LE's Pain Descriptors / Indicators: Aching;Discomfort;Operative site guarding;Sharp Pain Intervention(s): Limited activity within patient's tolerance;Monitored during session;Repositioned     Hand Dominance Left   Extremity/Trunk Assessment Upper Extremity Assessment Upper Extremity Assessment: Generalized weakness   Lower  Extremity Assessment Lower Extremity Assessment: Defer to PT evaluation       Communication Communication Communication: No difficulties   Cognition Arousal/Alertness: Awake/alert Behavior During Therapy: Agitated;WFL for tasks assessed/performed Overall Cognitive Status: Within Functional Limits for tasks assessed                                 General Comments: Cognition WFL for tasks assessed    General  Comments  spouse present     Exercises     Shoulder Instructions      Home Living Family/patient expects to be discharged to:: Private residence Living Arrangements: Spouse/significant other Available Help at Discharge: Family;Available PRN/intermittently;Personal care attendant(spouse indicates aide 3 hours/day ) Type of Home: House Home Access: Stairs to enter [employer] of Steps: 2   Home Layout: One level               Home Equipment: Walker - 2 wheels;Bedside commode;Electric scooter          Prior Functioning/Environment Level of Independence: Needs assistance  Gait / Transfers Assistance Needed: Has not been ambulating for past 3 weeks. Has been doing stand pivot transfers from bed to bedside commode or chair w/ assist from husband and/or aide.  ADL's / Homemaking Assistance Needed: Requires mod-max assistance for feeding, bathing, and dressing.   Comments: Legally blind- per pt        OT Problem List: Decreased strength;Decreased activity tolerance;Impaired balance (sitting and/or standing);Impaired vision/perception;Decreased knowledge of use of DME or AE;Decreased safety awareness;Decreased knowledge of precautions;Pain      OT Treatment/Interventions: Self-care/ADL training;DME and/or AE instruction;Therapeutic activities;Therapeutic exercise;Patient/family education;Balance training    OT Goals(Current goals can be found in the care plan section) Acute Rehab OT Goals Patient Stated Goal: to have less pain  OT Goal Formulation: With patient Time For Goal Achievement: 02/06/20 Potential to Achieve Goals: Good ADL Goals Pt Will Perform Grooming: with set-up;sitting Pt Will Perform Upper Body Bathing: with min assist;sitting Pt Will Transfer to Toilet: with mod assist;stand pivot transfer;bedside commode Pt Will Perform Toileting - Clothing Manipulation and hygiene: with max assist;sit to/from stand  OT Frequency: Min 2X/week   Barriers to  D/C: Decreased caregiver support  spouse unable to provide necessary level of assist        Co-evaluation              AM-PAC OT "6 Clicks" Daily Activity     Outcome Measure Help from another person eating meals?: A Lot Help from another person taking care of personal grooming?: A Lot Help from another person toileting, which includes using toliet, bedpan, or urinal?: Total Help from another person bathing (including washing, rinsing, drying)?: Total Help from another person to put on and taking off regular upper body clothing?: Total Help from another person to put on and taking off regular lower body clothing?: Total 6 Click Score: 8   End of Session Nurse Communication: Mobility status  Activity Tolerance: Patient limited by pain Patient left: in bed;with call bell/phone within reach;with bed alarm set;with SCD's reapplied  OT Visit Diagnosis: Unsteadiness on feet (R26.81);Pain;Low vision, both eyes (H54.2) Pain - part of body: (bacl )                Time: 8841-6606 OT Time Calculation (min): 16 min Charges:  OT General Charges $OT Visit: 1 Visit OT Evaluation $OT Eval Moderate Complexity: 1 Mod  Shakinah Navis C., OTR/L Acute Rehabilitation  Services Pager [phone] Office [phone]   Lucille Passy M 01/23/2020, 2:53 PM

## 2020-01-23 NOTE — Progress Notes (Signed)
Orthopedic Tech Progress Note Patient Details:  [name] [name] 1955/12/30 [id number] Left brace in patient room for [employer]. Patient stated she cannot open her eyes or move. Nurse ok'd her waiting until the morning to apply brace.  Patient ID: [name], adult   DOB: [birth year], 64 y.o.   MRN: [id number]   Staci Righter 01/23/2020, 1:16 AM

## 2020-01-23 NOTE — Progress Notes (Signed)
PROGRESS NOTE    [name] [name]  WEX:[id number] DOB: 10-17-1956 DOA: 01/20/2020 PCP: Ann Held, DO    Brief Narrative:  [phone] with hx DM2, CKD stage 3, hx recent septic elbow with recent L1 compression fracture. Pt presents with worsened back pain, found to have L1 osteo. Neurosurgery and ID consulted  Assessment & Plan:   Principal Problem:   Osteomyelitis of vertebra of thoracolumbar region [facility]) Active Problems:   Essential hypertension   Hepatic cirrhosis (HCC)   CAD (coronary artery disease)   CKD (chronic kidney disease), stage III (HCC)   Ulcer of left heel, limited to breakdown of skin ([place])   Type 2 diabetes mellitus with hyperglycemia, with long-term current use of insulin (HCC)   Septic arthritis of elbow, right (HCC)   Paraspinal mass   Spinal stenosis of lumbar region  Principal Problem:   Osteomyelitis of vertebra of thoracolumbar region [facility]), L1 lumbar discitis with paraspinal fluid enhancement -Currently in significant pain, 8/10, worse on movement -Neurosurgery consulted, pt underwent surgery 3/25 -ID following, awaiting final culture results -ID recs for continued IV vanc with rifampin. Levofloxacin discontinued with PICC ordered  Active Problems: Diabetes mellitus type 2, uncontrolled with hyperglycemia -Will continue with BID Lantus dosing with sliding scale coverage -Glucose trends stable thus far -Hemoglobin A1c 12.5    CKD (chronic kidney disease), stage IIIb (HCC) -Baseline creatinine 1.5-1.6 -Renal function stable    Essential hypertension -BP somewhat elevated, placed on Norvasc 5 mg daily -continue with hydralazine IV as needed with parameters    Hepatic cirrhosis (HCC) -No acute issues    CAD (coronary artery disease) -Currently no chest pain or any anginal symptoms  DVT prophylaxis: Lovenox subq Code Status: Full Family Communication: Pt in room, family at bedside Disposition Plan: From home, plan SNF when  cleared by neurosurgery and ID with abx regimen finalized  Consultants:   Neurosurgery  ID  Procedures:   L1 corpectomy and reconstruction with expandable cage, T11-L3 percutaneous posterior instrumentation 3/25  Antimicrobials: Anti-infectives (From admission, onward)   Start     Dose/Rate Route Frequency Ordered Stop   01/23/20 2200  vancomycin (VANCOREADY) IVPB 500 mg/100 mL     500 mg 100 mL/hr over 60 Minutes Intravenous Every 24 hours 01/22/20 2200     01/23/20 1430  rifampin (RIFADIN) capsule 600 mg     600 mg Oral Daily 01/23/20 1339     01/23/20 1000  Levofloxacin (LEVAQUIN) IVPB 250 mg  Status:  Discontinued     250 mg 50 mL/hr over 60 Minutes Intravenous Every 24 hours 01/22/20 2141 01/23/20 1339   01/22/20 2300  vancomycin (VANCOCIN) IVPB 1000 mg/200 mL premix     1,000 mg 200 mL/hr over 60 Minutes Intravenous To Post Anesthesia Care Unit 01/22/20 2200 01/23/20 2300   01/22/20 1505  bacitracin 50,000 Units in sodium chloride 0.9 % 500 mL irrigation  Status:  Discontinued       As needed 01/22/20 1505 01/22/20 2020   01/22/20 0800  vancomycin (VANCOCIN) IVPB 1000 mg/200 mL premix     1,000 mg 200 mL/hr over 60 Minutes Intravenous To ShortStay Surgical 01/22/20 0330 01/22/20 1435       Subjective: Complains of continued low back pain  Objective: Vitals:   01/23/20 0023 01/23/20 0429 01/23/20 0829 01/23/20 1345  BP: (!) 152/76 (!) 146/66 140/65 (!) 106/52  Pulse: (!) 102 100 92 90  Resp: 16 17 16 16   Temp: 97.9 F (36.6 C) 98.4 F (36.9  C) 98.3 F (36.8 C) (!) 100.9 F (38.3 C)  TempSrc: Oral Oral Oral Oral  SpO2: 99% 97% 100% 98%  Weight:      Height:        Intake/Output Summary (Last 24 hours) at 01/23/2020 1651 Last data filed at 01/23/2020 1600 Gross per 24 hour  Intake 4745 ml  Output 830 ml  Net 3915 ml   Filed Weights   01/20/20 1051 01/20/20 2300  Weight: 81.6 kg 81.7 kg    Examination:  General exam: Appears calm and appears to be  in pain Respiratory system: Clear to auscultation. Respiratory effort normal. Cardiovascular system: S1 & S2 heard, regular Gastrointestinal system: Abdomen is nondistended, soft and nontender. No organomegaly or masses felt. Normal bowel sounds heard. Central nervous system: Alert and oriented. No focal neurological deficits. Extremities: Symmetric 5 x 5 power. Skin: No rashes, lesions Psychiatry: Judgement and insight appear normal. Mood & affect appropriate.   Data Reviewed: I have personally reviewed following labs and imaging studies  CBC: Recent Labs  Lab 01/20/20 1056 01/20/20 1056 01/21/20 0619 01/21/20 0619 01/22/20 0534 01/22/20 1503 01/22/20 1642 01/22/20 1822 01/23/20 0323  WBC 7.6  --  7.7  --  6.5  --   --   --  10.6*  NEUTROABS  --   --  5.2  --   --   --   --   --   --   HGB 9.8*   < > 8.8*   < > 8.8* 8.2* 8.2* 9.2* 9.2*  HCT 32.2*   < > 29.1*   < > 29.1* 24.0* 24.0* 27.0* 29.7*  MCV 84.3  --  84.6  --  83.9  --   --   --  84.9  PLT 231  --  211  --  209  --   --   --  204   < > = values in this interval not displayed.   Basic Metabolic Panel: Recent Labs  Lab 01/20/20 1056 01/20/20 1056 01/21/20 0619 01/21/20 0619 01/22/20 0534 01/22/20 1503 01/22/20 1642 01/22/20 1822 01/23/20 0323  NA 136   < > 136   < > 139 140 139 139 136  K 4.2   < > 4.3   < > 4.5 3.8 4.4 4.8 4.5  CL 99  --  102  --  103  --   --   --  100  CO2 28  --  26  --  28  --   --   --  23  GLUCOSE 298*  --  163*  --  127*  --   --   --  302*  BUN 13  --  14  --  19  --   --   --  22  CREATININE 1.35*  --  1.55*  --  1.58*  --   --   --  1.67*  CALCIUM 9.0  --  8.7*  --  8.9  --   --   --  8.6*   < > = values in this interval not displayed.   GFR: Estimated Creatinine Clearance (by C-G formula based on SCr of 1.67 mg/dL (H)) Female: 33 mL/min (A) Female: 40.5 mL/min (A) Liver Function Tests: Recent Labs  Lab 01/20/20 1056 01/21/20 0619 01/22/20 0534  AST 9* 10* 10*  ALT 9 8 6    ALKPHOS 99 95 90  BILITOT 0.8 0.6 0.7  PROT 7.7 6.9 6.8  ALBUMIN 2.3* 2.1* 2.0*  Recent Labs  Lab 01/20/20 1056  LIPASE 37   No results for input(s): AMMONIA in the last 168 hours. Coagulation Profile: Recent Labs  Lab 01/22/20 0534  INR 1.2   Cardiac Enzymes: No results for input(s): CKTOTAL, CKMB, CKMBINDEX, TROPONINI in the last 168 hours. BNP (last 3 results) No results for input(s): PROBNP in the last 8760 hours. HbA1C: No results for input(s): HGBA1C in the last 72 hours. CBG: Recent Labs  Lab 01/22/20 0629 01/22/20 1130 01/22/20 2029 01/23/20 0648 01/23/20 1125  GLUCAP 112* 131* 186* 249* 228*   Lipid Profile: No results for input(s): CHOL, HDL, LDLCALC, TRIG, CHOLHDL, LDLDIRECT in the last 72 hours. Thyroid Function Tests: No results for input(s): TSH, T4TOTAL, FREET4, T3FREE, THYROIDAB in the last 72 hours. Anemia Panel: No results for input(s): VITAMINB12, FOLATE, FERRITIN, TIBC, IRON, RETICCTPCT in the last 72 hours. Sepsis Labs: No results for input(s): PROCALCITON, LATICACIDVEN in the last 168 hours.  Recent Results (from the past 240 hour(s))  Blood culture (routine x 2)     Status: None (Preliminary result)   Collection Time: 01/20/20  1:34 PM   Specimen: BLOOD RIGHT HAND  Result Value Ref Range Status   Specimen Description BLOOD RIGHT HAND  Final   Special Requests   Final    BOTTLES DRAWN AEROBIC ONLY Blood Culture results may not be optimal due to an inadequate volume of blood received in culture bottles   Culture   Final    NO GROWTH 3 DAYS Performed at [facility], [place] [address], [place], [place] [address]    Report Status PENDING  Incomplete  Blood culture (routine x 2)     Status: None (Preliminary result)   Collection Time: 01/20/20  3:28 PM   Specimen: BLOOD  Result Value Ref Range Status   Specimen Description BLOOD RIGHT ANTECUBITAL  Final   Special Requests   Final    BOTTLES DRAWN AEROBIC AND ANAEROBIC Blood Culture  adequate volume   Culture   Final    NO GROWTH 3 DAYS Performed at [facility], [place] [address], [place], [place] [address]    Report Status PENDING  Incomplete  SARS CORONAVIRUS 2 (TAT 6-24 HRS) Nasopharyngeal Nasopharyngeal Swab     Status: None   Collection Time: 01/20/20  3:37 PM   Specimen: Nasopharyngeal Swab  Result Value Ref Range Status   SARS Coronavirus 2 NEGATIVE NEGATIVE Final    Comment: (NOTE) SARS-CoV-2 target nucleic acids are NOT DETECTED. The SARS-CoV-2 RNA is generally detectable in upper and lower respiratory specimens during the acute phase of infection. Negative results do not preclude SARS-CoV-2 infection, do not rule out co-infections with other pathogens, and should not be used as the sole basis for treatment or other patient management decisions. Negative results must be combined with clinical observations, patient history, and epidemiological information. The expected result is Negative. Fact Sheet for Patients: SugarRoll.be Fact Sheet for Healthcare Providers: https://www.woods-mathews.com/ This test is not yet approved or cleared by the [place] FDA and  has been authorized for detection and/or diagnosis of SARS-CoV-2 by FDA under an Emergency Use Authorization (EUA). This EUA will remain  in effect (meaning this test can be used) for the duration of the COVID-19 declaration under Section 56 4(b)(1) of the Act, 21 U.S.C. section 360bbb-3(b)(1), unless the authorization is terminated or revoked sooner. Performed at [facility], [place] [address], [place], [place] [address]   MRSA PCR Screening  Status: None   Collection Time: 01/21/20  5:25 AM   Specimen: Nasal Mucosa; Nasopharyngeal  Result Value Ref Range Status   MRSA by PCR NEGATIVE NEGATIVE Final    Comment:        The GeneXpert MRSA Assay (FDA approved for NASAL specimens only), is one component of a comprehensive MRSA  colonization surveillance program. It is not intended to diagnose MRSA infection nor to guide or monitor treatment for MRSA infections. Performed at [facility], [place] [address], [place], [place] [address]   Surgical pcr screen     Status: Abnormal   Collection Time: 01/21/20  3:24 PM   Specimen: Nasal Mucosa; Nasal Swab  Result Value Ref Range Status   MRSA, PCR NEGATIVE NEGATIVE Final   Staphylococcus aureus POSITIVE (A) NEGATIVE Final    Comment: (NOTE) The Xpert SA Assay (FDA approved for NASAL specimens in patients 80 years of age and older), is one component of a comprehensive surveillance program. It is not intended to diagnose infection nor to guide or monitor treatment. Performed at [facility], [place] [address], [place], [place] [address]   Surgical pcr screen     Status: Abnormal   Collection Time: 01/22/20  3:46 AM   Specimen: Nasal Mucosa; Nasal Swab  Result Value Ref Range Status   MRSA, PCR NEGATIVE NEGATIVE Final   Staphylococcus aureus POSITIVE (A) NEGATIVE Final    Comment: CRITICAL RESULT CALLED TO, READ BACK BY AND VERIFIED WITH: RN DAVID Mamie Nick [phone] @[phone]  THANEY Performed at [facility], [place] [address] [place], [place] [address]   Aerobic/Anaerobic Culture (surgical/deep wound)     Status: None (Preliminary result)   Collection Time: 01/22/20  3:25 PM   Specimen: PATH Bone biopsy; Tissue  Result Value Ref Range Status   Specimen Description TISSUE  Final   Special Requests LUMBAR 1  Final   Gram Stain   Final    NO WBC SEEN RARE GRAM POSITIVE COCCI Performed at [facility], [address] [place], [place] [address]    Culture RARE GRAM NEGATIVE RODS  Final   Report Status PENDING  Incomplete  Aerobic/Anaerobic Culture (surgical/deep wound)     Status: None (Preliminary result)   Collection Time: 01/22/20  5:06 PM   Specimen: PATH Bone biopsy; Tissue  Result Value Ref Range Status   Specimen Description TISSUE  Final    Special Requests 2 LUMBAR 1  Final   Gram Stain NO WBC SEEN NO ORGANISMS SEEN   Final   Culture   Final    RARE GRAM NEGATIVE RODS CRITICAL RESULT CALLED TO, READ BACK BY AND VERIFIED WITH: RN Lenna Sciara [id number] [phone] FCP Performed at [facility], [address] [place], [place] [address]    Report Status PENDING  Incomplete     Radiology Studies: DG Thoracic Spine 2 View  Result Date: 01/23/2020 CLINICAL DATA:  Fusion of spine, thoraco lung in. EXAM: THORACIC SPINE 2 VIEWS COMPARISON:  Thoracic spine MRI 01/20/2020 FINDINGS: A right IJ approach central venous catheter appears to terminate in the region of the right brachiocephalic vein. Surgical clips within the right upper quadrant of the abdomen. Vertebral body height is maintained. Partially imaged thoracolumbar posterior spinal fusion construct spanning the T11-L3 levels with L1 corpectomy cage. The fusion construct is not demonstrate any adverse features. No new compression deformity. IMPRESSION: T11-L3 posterior spinal fusion construct with L1 corpectomy cage. The fusion construct demonstrates no adverse features. No  new thoracic compression deformity. A right IJ approach central venous catheter appears to terminate in the region of the right brachiocephalic vein. Correlate with desired positioning. Electronically Signed   By: Kellie Simmering DO   On: 01/23/2020 07:42   DG Lumbar Spine 2-3 Views  Result Date: 01/23/2020 CLINICAL DATA:  Fusion of spine, thoracolumbar region. EXAM: LUMBAR SPINE - 2-3 VIEW COMPARISON:  Lumbar spine MRI 01/20/2020 FINDINGS: Surgical clips within the right upper quadrant of the abdomen. Posterior spinal fusion construct spanning the T11-L3 levels with L1 corpectomy cage. The fusion construct demonstrates no adverse features. Redemonstrated irregularity of the L2 superior endplate with mild height loss. No new compression deformity. IMPRESSION: T11-L3 posterior spinal fusion construct with L1 corpectomy cage. The  fusion construct demonstrates no adverse features. Redemonstrated irregularity of the L2 superior endplate with mild height loss. No new lumbar compression deformity. Electronically Signed   By: Kellie Simmering DO   On: 01/23/2020 07:43   DG Lumbar Spine 2-3 Views  Result Date: 01/22/2020 CLINICAL DATA:  Lumbar spine surgery. EXAM: LUMBAR SPINE - 2-3 VIEW; DG C-ARM 1-60 MIN COMPARISON:  CT 11/15/2019 and MRI 01/20/2020 FINDINGS: Eleven spot intraoperative images of the lumbar spine demonstrate placement of a spacer over the previous site of patient's known L1 compression fracture with associated posterior fusion hardware from T11 to at least L3 but possibly extending below the L3 level. Hardware is intact. There is normal alignment of the visualized thoracolumbar spine. IMPRESSION: Intraoperative findings as described with posterior fusion hardware intact from T11 to at least the L3 level bridging a spacer device at the L1 level. Recommend correlation with findings at the time of the procedure. Electronically Signed   By: Marin Olp M.D.   On: 01/22/2020 20:27   DG Chest Port 1 View  Result Date: 01/22/2020 CLINICAL DATA:  64 year old female line placement. Status post thoracolumbar surgery, suspected spinal infection. EXAM: PORTABLE CHEST 1 VIEW COMPARISON:  Chest radiographs 11/12/2019 and earlier. FINDINGS: Portable AP semi upright view at 2040 hours. Right IJ approach central line placed. Tip at the cavoatrial junction. Mediastinal contours remain normal. Low lung volumes. No pneumothorax. Allowing for portable technique the lungs are clear. New lower thoracic and upper lumbar spinal hardware partially visible. Stable cholecystectomy clips. Paucity of bowel gas. Chronic osseous deformities about the left shoulder. IMPRESSION: 1. Right IJ central line placed, tip at the cavoatrial junction. No acute cardiopulmonary abnormality. 2. Partially visible new thoracolumbar spine hardware. Electronically Signed    By: Genevie Ann M.D.   On: 01/22/2020 21:01   DG C-Arm 1-60 Min  Result Date: 01/22/2020 CLINICAL DATA:  Lumbar spine surgery. EXAM: LUMBAR SPINE - 2-3 VIEW; DG C-ARM 1-60 MIN COMPARISON:  CT 11/15/2019 and MRI 01/20/2020 FINDINGS: Eleven spot intraoperative images of the lumbar spine demonstrate placement of a spacer over the previous site of patient's known L1 compression fracture with associated posterior fusion hardware from T11 to at least L3 but possibly extending below the L3 level. Hardware is intact. There is normal alignment of the visualized thoracolumbar spine. IMPRESSION: Intraoperative findings as described with posterior fusion hardware intact from T11 to at least the L3 level bridging a spacer device at the L1 level. Recommend correlation with findings at the time of the procedure. Electronically Signed   By: Marin Olp M.D.   On: 01/22/2020 20:27    Scheduled Meds: . amLODipine  5 mg Oral Daily  . atorvastatin  10 mg Oral Daily  . docusate sodium  100 mg Oral BID  . DULoxetine  60 mg Oral Daily  . enoxaparin (LOVENOX) injection  40 mg Subcutaneous Q24H  . feeding supplement (GLUCERNA SHAKE)  237 mL Oral TID BM  . insulin aspart  0-9 Units Subcutaneous TID WC  . insulin glargine  20 Units Subcutaneous Daily  . insulin glargine  30 Units Subcutaneous Q2200  . lidocaine  1 patch Transdermal Q24H  . multivitamin with minerals  1 tablet Oral Daily  . pregabalin  150 mg Oral BID  . rifampin  600 mg Oral Daily  . senna  1 tablet Oral BID  . sodium chloride flush  3 mL Intravenous Q12H   Continuous Infusions: . sodium chloride Stopped (01/22/20 2326)  . 0.9 % NaCl with KCl 20 mEq / L 100 mL/hr at 01/23/20 0939  . vancomycin    . vancomycin       LOS: 3 days   Marylu Lund, MD [facility] Pager On Amion  If 7PM-7AM, please contact night-coverage 01/23/2020, 4:51 PM

## 2020-01-23 NOTE — Progress Notes (Signed)
PT Progress Note for Charges    01/23/20 1300  PT Visit Information  Last PT Received On 01/23/20  PT General Charges  $$ ACUTE PT VISIT 1 Visit  PT Evaluation  $PT Eval Moderate Complexity 1 Mod  PT Treatments  $Therapeutic Activity 8-22 mins  Anastasio Champion, DPT  Acute Rehabilitation Services Pager [phone] Office [phone]

## 2020-01-24 LAB — GLUCOSE, CAPILLARY
Glucose-Capillary: 163 mg/dL — ABNORMAL HIGH (ref 70–99)
Glucose-Capillary: 177 mg/dL — ABNORMAL HIGH (ref 70–99)
Glucose-Capillary: 191 mg/dL — ABNORMAL HIGH (ref 70–99)
Glucose-Capillary: 189 mg/dL — ABNORMAL HIGH (ref 70–99)

## 2020-01-24 LAB — BASIC METABOLIC PANEL
Anion gap: 7 (ref 5–15)
BUN: 23 mg/dL (ref 8–23)
CO2: 24 mmol/L (ref 22–32)
Calcium: 8.4 mg/dL — ABNORMAL LOW (ref 8.9–10.3)
Chloride: 107 mmol/L (ref 98–111)
Creatinine, Ser: 1.65 mg/dL — ABNORMAL HIGH (ref 0.44–1.00)
GFR calc Af Amer: 38 mL/min — ABNORMAL LOW (ref >60)
GFR calc non Af Amer: 32 mL/min — ABNORMAL LOW (ref >60)
Glucose, Bld: 186 mg/dL — ABNORMAL HIGH (ref 70–99)
Potassium: 4.9 mmol/L (ref 3.5–5.1)
Sodium: 138 mmol/L (ref 135–145)

## 2020-01-24 LAB — CBC
HCT: 25 % — ABNORMAL LOW (ref 36.0–46.0)
Hemoglobin: 7.5 g/dL — ABNORMAL LOW (ref 12.0–15.0)
MCH: 25.8 pg — ABNORMAL LOW (ref 26.0–34.0)
MCHC: 30 g/dL (ref 30.0–36.0)
MCV: 85.9 fL (ref 80.0–100.0)
Platelets: 172 10*3/uL (ref 150–400)
RBC: 2.91 MIL/uL — ABNORMAL LOW (ref 3.87–5.11)
RDW: 14.7 % (ref 11.5–15.5)
WBC: 8.8 10*3/uL (ref 4.0–10.5)
nRBC: 0 % (ref 0.0–0.2)

## 2020-01-24 MED ORDER — SODIUM CHLORIDE 0.9 % IV SOLN: 2.0000 g | Freq: Three times a day (TID) | INTRAVENOUS | Status: DC

## 2020-01-24 MED ORDER — SODIUM CHLORIDE 0.9 % IV SOLN
2.0000 g | Freq: Two times a day (BID) | INTRAVENOUS | Status: DC
  Administered 2020-01-24 – 2020-01-26 (×5): 2 g via INTRAVENOUS
  Filled 2020-01-24 (×5): qty 2

## 2020-01-24 NOTE — Progress Notes (Signed)
Pharmacy Antibiotic Note  [name] [name] is a 64 y.o. adult admitted on 01/20/2020 with osteomyelitis. 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 S/p lumbar debridement and hardware stabilization on 3/25. Bone biopsy Gram stain with GPC, culture growing GNR. Pharmacy has been consulted for vancomycin and cefepime dosing.  Pt was febrile yesterday, Tmax 100.9. WBC down and wnl 8.8. Renal function stable with hx CKD 3, SCr 1.65 (baseline 1.5-1.6), CrCl = 33 ml/min.  Plan Continue vancomycin 500 mg IV q24h (eAUC 471 with SCr 1.65) Vanc levels at steady state if continued Add cefepime 2 gm IV q12h Continue rifampin 600 mg PO daily per MD Watch renal fxn closely and adjust dose as indicated F/u cultures, clinical improvement, and abx narrowing   Height: 5' 1"  (154.9 cm) Weight: 180 lb 0.8 oz (81.7 kg) IBW/kg (Calculated) : 47.8  Temp (24hrs), Avg:99.1 F (37.3 C), Min:97.8 F (36.6 C), Max:100.9 F (38.3 C)  Recent Labs  Lab 01/20/20 1056 01/21/20 0619 01/22/20 0534 01/23/20 0323 01/24/20 0253  WBC 7.6 7.7 6.5 10.6* 8.8  CREATININE 1.35* 1.55* 1.58* 1.67* 1.65*    Estimated Creatinine Clearance (by C-G formula based on SCr of 1.65 mg/dL (H)) Female: 33.4 mL/min (A) Female: 41 mL/min (A)    Allergies  Allergen Reactions  . Bee Pollen Anaphylaxis  . Fish-Derived Products Hives, Shortness Of Breath, Swelling and Rash    Hives get in throat causing trouble breathing  . Mushroom Extract Complex Anaphylaxis  . Penicillins Anaphylaxis    Did it involve swelling of the face/tongue/throat, SOB, or low BP? Yes Did it involve sudden or severe rash/hives, skin peeling, or any reaction on the inside of your mouth or nose? No Did you need to seek medical attention at a hospital or doctor's office? Yes When did it last happen?A few months ago If all above answers are "NO", may proceed with cephalosporin use.  [name] Kitchen Rosemary Oil Anaphylaxis  . Shellfish Allergy  Hives, Shortness Of Breath, Swelling and Rash  . Tomato Hives and Shortness Of Breath    Hives in throat causes her trouble breathing  . Acetaminophen Other (See Comments)    GI upset  . Acyclovir And Related Other (See Comments)    Unknown rxn  . Aloe Vera Hives  . Broccoli [Brassica Oleracea] Hives  . Naproxen Other (See Comments)    Unknown rxn   Antimicrobials this admission: Levofloxacin 3/25 >> 3/26 Vancomycin 3/25 >> Rifampin 3/26 >>  Microbiology this admission: 3/23 BCx >> ngtd x3d 3/23 COVID negative 3/24 Surgical PCR screen MRSA negative, Staph aureus positive 3/25 Bone biopsy >> rare GPC on Gram stain, cx growing rare GNR  Thank you for allowing pharmacy to be a part of this patient's care.  Berenice Bouton, PharmD PGY1 Pharmacy Resident  Please check AMION for all [place] phone numbers After 10:00 PM, call [place] [phone]  01/24/2020 9:47 AM

## 2020-01-24 NOTE — Progress Notes (Signed)
PROGRESS NOTE    [name] [name]  GEX:[id number] DOB: 10-Oct-1956 DOA: 01/20/2020 PCP: Ann Held, DO    Brief Narrative:  [phone] with hx DM2, CKD stage 3, hx recent septic elbow with recent L1 compression fracture. Pt presents with worsened back pain, found to have L1 osteo. Neurosurgery and ID consulted  Assessment & Plan:   Principal Problem:   Osteomyelitis of vertebra of thoracolumbar region [facility]) Active Problems:   Essential hypertension   Hepatic cirrhosis (HCC)   CAD (coronary artery disease)   CKD (chronic kidney disease), stage III (HCC)   Ulcer of left heel, limited to breakdown of skin ([place])   Type 2 diabetes mellitus with hyperglycemia, with long-term current use of insulin (HCC)   Septic arthritis of elbow, right (HCC)   Paraspinal mass   Spinal stenosis of lumbar region  Principal Problem:   Osteomyelitis of vertebra of thoracolumbar region [facility]), L1 lumbar discitis with paraspinal fluid enhancement -Currently in significant pain, 8/10, worse on movement -Neurosurgery consulted, pt underwent surgery 3/25 -ID following, pseudomonas growing in tissue cx -Continued on IV vanc with rifampin. Discussed with pharmacy, cefepime added  Active Problems: Diabetes mellitus type 2, uncontrolled with hyperglycemia -Will continue with BID Lantus dosing with sliding scale coverage -Glucose trends thus far stable -Hemoglobin A1c 12.5    CKD (chronic kidney disease), stage IIIb (HCC) -Baseline creatinine 1.5-1.6 -Renal function remains stable    Essential hypertension -BP somewhat elevated, placed on Norvasc 5 mg daily -continue with hydralazine IV as needed    Hepatic cirrhosis (HCC) -No acute issues    CAD (coronary artery disease) -Currently no chest pain or any anginal symptoms -Stable at this time  DVT prophylaxis: Lovenox subq Code Status: Full Family Communication: Pt in room, family at bedside Disposition Plan: From home, plan SNF when  cleared by neurosurgery and ID with abx regimen finalized  Consultants:   Neurosurgery  ID  Procedures:   L1 corpectomy and reconstruction with expandable cage, T11-L3 percutaneous posterior instrumentation 3/25  Antimicrobials: Anti-infectives (From admission, onward)   Start     Dose/Rate Route Frequency Ordered Stop   01/24/20 1030  ceFEPIme (MAXIPIME) 2 g in sodium chloride 0.9 % 100 mL IVPB     2 g 200 mL/hr over 30 Minutes Intravenous Every 12 hours 01/24/20 1012     01/24/20 1000  ceFEPIme (MAXIPIME) 2 g in sodium chloride 0.9 % 100 mL IVPB  Status:  Discontinued     2 g 200 mL/hr over 30 Minutes Intravenous Every 8 hours 01/24/20 0939 01/24/20 1012   01/23/20 2200  vancomycin (VANCOREADY) IVPB 500 mg/100 mL     500 mg 100 mL/hr over 60 Minutes Intravenous Every 24 hours 01/22/20 2200     01/23/20 1430  rifampin (RIFADIN) capsule 600 mg     600 mg Oral Daily 01/23/20 1339     01/23/20 1000  Levofloxacin (LEVAQUIN) IVPB 250 mg  Status:  Discontinued     250 mg 50 mL/hr over 60 Minutes Intravenous Every 24 hours 01/22/20 2141 01/23/20 1339   01/22/20 2300  vancomycin (VANCOCIN) IVPB 1000 mg/200 mL premix     1,000 mg 200 mL/hr over 60 Minutes Intravenous To Post Anesthesia Care Unit 01/22/20 2200 01/23/20 2300   01/22/20 1505  bacitracin 50,000 Units in sodium chloride 0.9 % 500 mL irrigation  Status:  Discontinued       As needed 01/22/20 1505 01/22/20 2020   01/22/20 0800  vancomycin (VANCOCIN) IVPB 1000 mg/200  mL premix     1,000 mg 200 mL/hr over 60 Minutes Intravenous To ShortStay Surgical 01/22/20 0330 01/22/20 1435      Subjective: Still in pain, but better  Objective: Vitals:   01/23/20 1951 01/24/20 0326 01/24/20 0743 01/24/20 1356  BP: (!) 117/56 (!) 121/58 136/70 (!) 119/59  Pulse: (!) 109 89 92 100  Resp:   17 17  Temp: 99.7 F (37.6 C) 98 F (36.7 C) 97.8 F (36.6 C) 98 F (36.7 C)  TempSrc: Oral Oral Oral Oral  SpO2: 95% 94% 95% 93%  Weight:       Height:        Intake/Output Summary (Last 24 hours) at 01/24/2020 1508 Last data filed at 01/24/2020 0300 Gross per 24 hour  Intake 3189.83 ml  Output 300 ml  Net 2889.83 ml   Filed Weights   01/20/20 1051 01/20/20 2300  Weight: 81.6 kg 81.7 kg    Examination: General exam: Awake, laying in bed, in nad, appears uncomfortable Respiratory system: Normal respiratory effort, no wheezing Cardiovascular system: regular rate, s1, s2 Gastrointestinal system: Soft, nondistended, positive BS Central nervous system: CN2-12 grossly intact, strength intact Extremities: Perfused, no clubbing Skin: Normal skin turgor, no notable skin lesions seen Psychiatry: Mood normal // no visual hallucinations   Data Reviewed: I have personally reviewed following labs and imaging studies  CBC: Recent Labs  Lab 01/20/20 1056 01/20/20 1056 01/21/20 0619 01/21/20 0619 01/22/20 0534 01/22/20 0534 01/22/20 1503 01/22/20 1642 01/22/20 1822 01/23/20 0323 01/24/20 0253  WBC 7.6  --  7.7  --  6.5  --   --   --   --  10.6* 8.8  NEUTROABS  --   --  5.2  --   --   --   --   --   --   --   --   HGB 9.8*   < > 8.8*   < > 8.8*   < > 8.2* 8.2* 9.2* 9.2* 7.5*  HCT 32.2*   < > 29.1*   < > 29.1*   < > 24.0* 24.0* 27.0* 29.7* 25.0*  MCV 84.3  --  84.6  --  83.9  --   --   --   --  84.9 85.9  PLT 231  --  211  --  209  --   --   --   --  204 172   < > = values in this interval not displayed.   Basic Metabolic Panel: Recent Labs  Lab 01/20/20 1056 01/20/20 1056 01/21/20 0619 01/21/20 0619 01/22/20 0534 01/22/20 0534 01/22/20 1503 01/22/20 1642 01/22/20 1822 01/23/20 0323 01/24/20 0253  NA 136   < > 136   < > 139   < > 140 139 139 136 138  K 4.2   < > 4.3   < > 4.5   < > 3.8 4.4 4.8 4.5 4.9  CL 99  --  102  --  103  --   --   --   --  100 107  CO2 28  --  26  --  28  --   --   --   --  23 24  GLUCOSE 298*  --  163*  --  127*  --   --   --   --  302* 186*  BUN 13  --  14  --  19  --   --   --    --  22 23  CREATININE 1.35*  --  1.55*  --  1.58*  --   --   --   --  1.67* 1.65*  CALCIUM 9.0  --  8.7*  --  8.9  --   --   --   --  8.6* 8.4*   < > = values in this interval not displayed.   GFR: Estimated Creatinine Clearance (by C-G formula based on SCr of 1.65 mg/dL (H)) Female: 33.4 mL/min (A) Female: 41 mL/min (A) Liver Function Tests: Recent Labs  Lab 01/20/20 1056 01/21/20 0619 01/22/20 0534  AST 9* 10* 10*  ALT 9 8 6   ALKPHOS 99 95 90  BILITOT 0.8 0.6 0.7  PROT 7.7 6.9 6.8  ALBUMIN 2.3* 2.1* 2.0*   Recent Labs  Lab 01/20/20 1056  LIPASE 37   No results for input(s): AMMONIA in the last 168 hours. Coagulation Profile: Recent Labs  Lab 01/22/20 0534  INR 1.2   Cardiac Enzymes: No results for input(s): CKTOTAL, CKMB, CKMBINDEX, TROPONINI in the last 168 hours. BNP (last 3 results) No results for input(s): PROBNP in the last 8760 hours. HbA1C: No results for input(s): HGBA1C in the last 72 hours. CBG: Recent Labs  Lab 01/23/20 1125 01/23/20 1650 01/23/20 2125 01/24/20 0647 01/24/20 1122  GLUCAP 228* 223* 202* 163* 177*   Lipid Profile: No results for input(s): CHOL, HDL, LDLCALC, TRIG, CHOLHDL, LDLDIRECT in the last 72 hours. Thyroid Function Tests: No results for input(s): TSH, T4TOTAL, FREET4, T3FREE, THYROIDAB in the last 72 hours. Anemia Panel: No results for input(s): VITAMINB12, FOLATE, FERRITIN, TIBC, IRON, RETICCTPCT in the last 72 hours. Sepsis Labs: No results for input(s): PROCALCITON, LATICACIDVEN in the last 168 hours.  Recent Results (from the past 240 hour(s))  Blood culture (routine x 2)     Status: None (Preliminary result)   Collection Time: 01/20/20  1:34 PM   Specimen: BLOOD RIGHT HAND  Result Value Ref Range Status   Specimen Description BLOOD RIGHT HAND  Final   Special Requests   Final    BOTTLES DRAWN AEROBIC ONLY Blood Culture results may not be optimal due to an inadequate volume of blood received in culture bottles    Culture   Final    NO GROWTH 3 DAYS Performed at [facility], [place] [address], [place], [place] [address]    Report Status PENDING  Incomplete  Blood culture (routine x 2)     Status: None (Preliminary result)   Collection Time: 01/20/20  3:28 PM   Specimen: BLOOD  Result Value Ref Range Status   Specimen Description BLOOD RIGHT ANTECUBITAL  Final   Special Requests   Final    BOTTLES DRAWN AEROBIC AND ANAEROBIC Blood Culture adequate volume   Culture   Final    NO GROWTH 3 DAYS Performed at [facility], [place] [address] [place], [place] [address]    Report Status PENDING  Incomplete  SARS CORONAVIRUS 2 (TAT 6-24 HRS) Nasopharyngeal Nasopharyngeal Swab     Status: None   Collection Time: 01/20/20  3:37 PM   Specimen: Nasopharyngeal Swab  Result Value Ref Range Status   SARS Coronavirus 2 NEGATIVE NEGATIVE Final    Comment: (NOTE) SARS-CoV-2 target nucleic acids are NOT DETECTED. The SARS-CoV-2 RNA is generally detectable in upper and lower respiratory specimens during the acute phase of infection. Negative results do not preclude SARS-CoV-2 infection, do not rule out co-infections with other pathogens, and should not be used as the sole basis  for treatment or other patient management decisions. Negative results must be combined with clinical observations, patient history, and epidemiological information. The expected result is Negative. Fact Sheet for Patients: SugarRoll.be Fact Sheet for Healthcare Providers: https://www.woods-mathews.com/ This test is not yet approved or cleared by the [place] FDA and  has been authorized for detection and/or diagnosis of SARS-CoV-2 by FDA under an Emergency Use Authorization (EUA). This EUA will remain  in effect (meaning this test can be used) for the duration of the COVID-19 declaration under Section 56 4(b)(1) of the Act, 21 U.S.C. section 360bbb-3(b)(1), unless the authorization  is terminated or revoked sooner. Performed at [facility], [place] [address], [place], [place] [address]   MRSA PCR Screening     Status: None   Collection Time: 01/21/20  5:25 AM   Specimen: Nasal Mucosa; Nasopharyngeal  Result Value Ref Range Status   MRSA by PCR NEGATIVE NEGATIVE Final    Comment:        The GeneXpert MRSA Assay (FDA approved for NASAL specimens only), is one component of a comprehensive MRSA colonization surveillance program. It is not intended to diagnose MRSA infection nor to guide or monitor treatment for MRSA infections. Performed at [facility], [place] [address], [place], [place] [address]   Surgical pcr screen     Status: Abnormal   Collection Time: 01/21/20  3:24 PM   Specimen: Nasal Mucosa; Nasal Swab  Result Value Ref Range Status   MRSA, PCR NEGATIVE NEGATIVE Final   Staphylococcus aureus POSITIVE (A) NEGATIVE Final    Comment: (NOTE) The Xpert SA Assay (FDA approved for NASAL specimens in patients 33 years of age and older), is one component of a comprehensive surveillance program. It is not intended to diagnose infection nor to guide or monitor treatment. Performed at [facility], [place] [address], [place], [place] [address]   Surgical pcr screen     Status: Abnormal   Collection Time: 01/22/20  3:46 AM   Specimen: Nasal Mucosa; Nasal Swab  Result Value Ref Range Status   MRSA, PCR NEGATIVE NEGATIVE Final   Staphylococcus aureus POSITIVE (A) NEGATIVE Final    Comment: CRITICAL RESULT CALLED TO, READ BACK BY AND VERIFIED WITH: RN DAVID Mamie Nick [phone] @[phone]  THANEY Performed at [facility], [place] [address] [place], [place] [address]   Aerobic/Anaerobic Culture (surgical/deep wound)     Status: None (Preliminary result)   Collection Time: 01/22/20  3:25 PM   Specimen: PATH Bone biopsy; Tissue  Result Value Ref Range Status   Specimen Description TISSUE  Final   Special Requests LUMBAR 1  Final   Gram Stain NO WBC  SEEN RARE GRAM POSITIVE COCCI   Final   Culture   Final    RARE PSEUDOMONAS AERUGINOSA CULTURE REINCUBATED FOR BETTER GROWTH Performed at [facility], [address], [place], [place] [address]    Report Status PENDING  Incomplete   Organism ID, Bacteria PSEUDOMONAS AERUGINOSA  Final      Susceptibility   Pseudomonas aeruginosa - MIC*    CEFTAZIDIME 4 SENSITIVE Sensitive     CIPROFLOXACIN <=0.25 SENSITIVE Sensitive     GENTAMICIN <=1 SENSITIVE Sensitive     IMIPENEM 1 SENSITIVE Sensitive     PIP/TAZO 8 SENSITIVE Sensitive     CEFEPIME 2 SENSITIVE Sensitive     * RARE PSEUDOMONAS AERUGINOSA  Acid Fast Smear (AFB)     Status: None   Collection Time: 01/22/20  3:25 PM   Specimen: PATH Bone biopsy; Tissue  Result Value Ref Range Status   AFB Specimen Processing Comment  Final    Comment: Tissue Grinding and Digestion/Decontamination   Acid Fast Smear Negative  Final    Comment: (NOTE) Performed At: [facility] [place], [place] [id number] Rush Farmer MD [id number]    Source (AFB) TISSUE  Final    Comment: LUMBAR 1 Performed at [facility], [place] [address], [place], [place] [address]   Aerobic/Anaerobic Culture (surgical/deep wound)     Status: None (Preliminary result)   Collection Time: 01/22/20  5:06 PM   Specimen: PATH Bone biopsy; Tissue  Result Value Ref Range Status   Specimen Description TISSUE  Final   Special Requests 2 LUMBAR 1  Final   Gram Stain NO WBC SEEN NO ORGANISMS SEEN   Final   Culture   Final    RARE PSEUDOMONAS AERUGINOSA CRITICAL RESULT CALLED TO, READ BACK BY AND VERIFIED WITH: RN J [id number] FCP CULTURE REINCUBATED FOR BETTER GROWTH Performed at [facility], [place] [address], [place], [place] [address]    Report Status PENDING  Incomplete   Organism ID, Bacteria PSEUDOMONAS AERUGINOSA  Final      Susceptibility   Pseudomonas aeruginosa - MIC*    CEFTAZIDIME 4 SENSITIVE Sensitive      CIPROFLOXACIN 0.5 SENSITIVE Sensitive     GENTAMICIN <=1 SENSITIVE Sensitive     IMIPENEM 1 SENSITIVE Sensitive     PIP/TAZO 8 SENSITIVE Sensitive     CEFEPIME 2 SENSITIVE Sensitive     * RARE PSEUDOMONAS AERUGINOSA  Acid Fast Smear (AFB)     Status: None   Collection Time: 01/22/20  5:06 PM   Specimen: PATH Bone biopsy; Tissue  Result Value Ref Range Status   AFB Specimen Processing Comment  Final    Comment: Tissue Grinding and Digestion/Decontamination   Acid Fast Smear Negative  Final    Comment: (NOTE) Performed At: [facility] [place], [place] [id number] Rush Farmer MD GO:[id number]    Source (AFB) 2  Final    Comment: TISSUE LUMBAR 1 Performed at [facility], [place] [address], [place], [place] [address]      Radiology Studies: DG Thoracic Spine 2 View  Result Date: 01/23/2020 CLINICAL DATA:  Fusion of spine, thoraco lung in. EXAM: THORACIC SPINE 2 VIEWS COMPARISON:  Thoracic spine MRI 01/20/2020 FINDINGS: A right IJ approach central venous catheter appears to terminate in the region of the right brachiocephalic vein. Surgical clips within the right upper quadrant of the abdomen. Vertebral body height is maintained. Partially imaged thoracolumbar posterior spinal fusion construct spanning the T11-L3 levels with L1 corpectomy cage. The fusion construct is not demonstrate any adverse features. No new compression deformity. IMPRESSION: T11-L3 posterior spinal fusion construct with L1 corpectomy cage. The fusion construct demonstrates no adverse features. No new thoracic compression deformity. A right IJ approach central venous catheter appears to terminate in the region of the right brachiocephalic vein. Correlate with desired positioning. Electronically Signed   By: Kellie Simmering DO   On: 01/23/2020 07:42   DG Lumbar Spine 2-3 Views  Result Date: 01/23/2020 CLINICAL DATA:  Fusion of spine, thoracolumbar region. EXAM: LUMBAR SPINE - 2-3 VIEW  COMPARISON:  Lumbar spine MRI 01/20/2020 FINDINGS: Surgical clips within the right upper quadrant of the abdomen. Posterior spinal fusion construct spanning the T11-L3 levels with L1 corpectomy cage. The fusion construct demonstrates no  adverse features. Redemonstrated irregularity of the L2 superior endplate with mild height loss. No new compression deformity. IMPRESSION: T11-L3 posterior spinal fusion construct with L1 corpectomy cage. The fusion construct demonstrates no adverse features. Redemonstrated irregularity of the L2 superior endplate with mild height loss. No new lumbar compression deformity. Electronically Signed   By: Kellie Simmering DO   On: 01/23/2020 07:43   DG Lumbar Spine 2-3 Views  Result Date: 01/22/2020 CLINICAL DATA:  Lumbar spine surgery. EXAM: LUMBAR SPINE - 2-3 VIEW; DG C-ARM 1-60 MIN COMPARISON:  CT 11/15/2019 and MRI 01/20/2020 FINDINGS: Eleven spot intraoperative images of the lumbar spine demonstrate placement of a spacer over the previous site of patient's known L1 compression fracture with associated posterior fusion hardware from T11 to at least L3 but possibly extending below the L3 level. Hardware is intact. There is normal alignment of the visualized thoracolumbar spine. IMPRESSION: Intraoperative findings as described with posterior fusion hardware intact from T11 to at least the L3 level bridging a spacer device at the L1 level. Recommend correlation with findings at the time of the procedure. Electronically Signed   By: Marin Olp M.D.   On: 01/22/2020 20:27   DG Chest Port 1 View  Result Date: 01/22/2020 CLINICAL DATA:  64 year old female line placement. Status post thoracolumbar surgery, suspected spinal infection. EXAM: PORTABLE CHEST 1 VIEW COMPARISON:  Chest radiographs 11/12/2019 and earlier. FINDINGS: Portable AP semi upright view at 2040 hours. Right IJ approach central line placed. Tip at the cavoatrial junction. Mediastinal contours remain normal. Low lung  volumes. No pneumothorax. Allowing for portable technique the lungs are clear. New lower thoracic and upper lumbar spinal hardware partially visible. Stable cholecystectomy clips. Paucity of bowel gas. Chronic osseous deformities about the left shoulder. IMPRESSION: 1. Right IJ central line placed, tip at the cavoatrial junction. No acute cardiopulmonary abnormality. 2. Partially visible new thoracolumbar spine hardware. Electronically Signed   By: Genevie Ann M.D.   On: 01/22/2020 21:01   DG C-Arm 1-60 Min  Result Date: 01/22/2020 CLINICAL DATA:  Lumbar spine surgery. EXAM: LUMBAR SPINE - 2-3 VIEW; DG C-ARM 1-60 MIN COMPARISON:  CT 11/15/2019 and MRI 01/20/2020 FINDINGS: Eleven spot intraoperative images of the lumbar spine demonstrate placement of a spacer over the previous site of patient's known L1 compression fracture with associated posterior fusion hardware from T11 to at least L3 but possibly extending below the L3 level. Hardware is intact. There is normal alignment of the visualized thoracolumbar spine. IMPRESSION: Intraoperative findings as described with posterior fusion hardware intact from T11 to at least the L3 level bridging a spacer device at the L1 level. Recommend correlation with findings at the time of the procedure. Electronically Signed   By: Marin Olp M.D.   On: 01/22/2020 20:27    Scheduled Meds: . amLODipine  5 mg Oral Daily  . atorvastatin  10 mg Oral Daily  . docusate sodium  100 mg Oral BID  . DULoxetine  60 mg Oral Daily  . enoxaparin (LOVENOX) injection  40 mg Subcutaneous Q24H  . feeding supplement (GLUCERNA SHAKE)  237 mL Oral TID BM  . insulin aspart  0-9 Units Subcutaneous TID WC  . insulin glargine  20 Units Subcutaneous Daily  . insulin glargine  30 Units Subcutaneous Q2200  . lidocaine  1 patch Transdermal Q24H  . multivitamin with minerals  1 tablet Oral Daily  . pregabalin  150 mg Oral BID  . rifampin  600 mg Oral Daily  . senna  1 tablet Oral BID  .  sodium chloride flush  3 mL Intravenous Q12H   Continuous Infusions: . sodium chloride Stopped (01/22/20 2326)  . 0.9 % NaCl with KCl 20 mEq / L 100 mL/hr at 01/24/20 8350  . ceFEPime (MAXIPIME) IV 2 g (01/24/20 1153)  . vancomycin Stopped (01/24/20 0001)     LOS: 4 days   Marylu Lund, MD [facility] Pager On Amion  If 7PM-7AM, please contact night-coverage 01/24/2020, 3:08 PM

## 2020-01-24 NOTE — Progress Notes (Signed)
      INFECTIOUS DISEASE ATTENDING ADDENDUM:   Date: 01/24/2020  Patient name: [name] [name]  Medical record number: [id number]  Date of birth: 09-Nov-1955   Called by Monika Salk Pharm D re [place] growing on culture  Also spoke with micro re the [facility] seen on GS  They will re-examine GS but I am now very skeptical of the sig of the [place] seen on GS  Pseudomonas is growing from multiple cultures despite being S to [place] which patient was on  Would plan on 6 weeks of IV cefepime followed by protracted ciprofloxacin  I have DC vancomycin and rifampin       Rhina Brackett [place] 01/24/2020, 3:41 PM

## 2020-01-24 NOTE — Progress Notes (Signed)
Subjective: The patient is alert and pleasant.  Her back is appropriately sore.  Objective: Vital signs in last 24 hours: Temp:  [97.8 F (36.6 C)-100.9 F (38.3 C)] 97.8 F (36.6 C) (03/27 0743) Pulse Rate:  [89-109] 92 (03/27 0743) Resp:  [16-17] 17 (03/27 0743) BP: (106-136)/(52-70) 136/70 (03/27 0743) SpO2:  [94 %-98 %] 95 % (03/27 0743) Estimated body mass index is 34.02 kg/m as calculated from the following:   Height as of this encounter: 5' 1"  (1.549 m).   Weight as of this encounter: 81.7 kg.   Intake/Output from previous day: 03/26 0701 - 03/27 0700 In: 3409.8 [P.O.:580; I.V.:2679.8; IV Piggyback:150] Out: 300 [Urine:300] Intake/Output this shift: No intake/output data recorded.  Physical exam the patient is alert and oriented.  She is moving her lower extremities well.  Lab Results: Recent Labs    01/23/20 0323 01/24/20 0253  WBC 10.6* 8.8  HGB 9.2* 7.5*  HCT 29.7* 25.0*  PLT 204 172   BMET Recent Labs    01/23/20 0323 01/24/20 0253  NA 136 138  K 4.5 4.9  CL 100 107  CO2 23 24  GLUCOSE 302* 186*  BUN 22 23  CREATININE 1.67* 1.65*  CALCIUM 8.6* 8.4*    Studies/Results: DG Thoracic Spine 2 View  Result Date: 01/23/2020 CLINICAL DATA:  Fusion of spine, thoraco lung in. EXAM: THORACIC SPINE 2 VIEWS COMPARISON:  Thoracic spine MRI 01/20/2020 FINDINGS: A right IJ approach central venous catheter appears to terminate in the region of the right brachiocephalic vein. Surgical clips within the right upper quadrant of the abdomen. Vertebral body height is maintained. Partially imaged thoracolumbar posterior spinal fusion construct spanning the T11-L3 levels with L1 corpectomy cage. The fusion construct is not demonstrate any adverse features. No new compression deformity. IMPRESSION: T11-L3 posterior spinal fusion construct with L1 corpectomy cage. The fusion construct demonstrates no adverse features. No new thoracic compression deformity. A right IJ approach  central venous catheter appears to terminate in the region of the right brachiocephalic vein. Correlate with desired positioning. Electronically Signed   By: Kellie Simmering DO   On: 01/23/2020 07:42   DG Lumbar Spine 2-3 Views  Result Date: 01/23/2020 CLINICAL DATA:  Fusion of spine, thoracolumbar region. EXAM: LUMBAR SPINE - 2-3 VIEW COMPARISON:  Lumbar spine MRI 01/20/2020 FINDINGS: Surgical clips within the right upper quadrant of the abdomen. Posterior spinal fusion construct spanning the T11-L3 levels with L1 corpectomy cage. The fusion construct demonstrates no adverse features. Redemonstrated irregularity of the L2 superior endplate with mild height loss. No new compression deformity. IMPRESSION: T11-L3 posterior spinal fusion construct with L1 corpectomy cage. The fusion construct demonstrates no adverse features. Redemonstrated irregularity of the L2 superior endplate with mild height loss. No new lumbar compression deformity. Electronically Signed   By: Kellie Simmering DO   On: 01/23/2020 07:43   DG Lumbar Spine 2-3 Views  Result Date: 01/22/2020 CLINICAL DATA:  Lumbar spine surgery. EXAM: LUMBAR SPINE - 2-3 VIEW; DG C-ARM 1-60 MIN COMPARISON:  CT 11/15/2019 and MRI 01/20/2020 FINDINGS: Eleven spot intraoperative images of the lumbar spine demonstrate placement of a spacer over the previous site of patient's known L1 compression fracture with associated posterior fusion hardware from T11 to at least L3 but possibly extending below the L3 level. Hardware is intact. There is normal alignment of the visualized thoracolumbar spine. IMPRESSION: Intraoperative findings as described with posterior fusion hardware intact from T11 to at least the L3 level bridging a spacer device at the  L1 level. Recommend correlation with findings at the time of the procedure. Electronically Signed   By: Marin Olp M.D.   On: 01/22/2020 20:27   DG Chest Port 1 View  Result Date: 01/22/2020 CLINICAL DATA:  64 year old  female line placement. Status post thoracolumbar surgery, suspected spinal infection. EXAM: PORTABLE CHEST 1 VIEW COMPARISON:  Chest radiographs 11/12/2019 and earlier. FINDINGS: Portable AP semi upright view at 2040 hours. Right IJ approach central line placed. Tip at the cavoatrial junction. Mediastinal contours remain normal. Low lung volumes. No pneumothorax. Allowing for portable technique the lungs are clear. New lower thoracic and upper lumbar spinal hardware partially visible. Stable cholecystectomy clips. Paucity of bowel gas. Chronic osseous deformities about the left shoulder. IMPRESSION: 1. Right IJ central line placed, tip at the cavoatrial junction. No acute cardiopulmonary abnormality. 2. Partially visible new thoracolumbar spine hardware. Electronically Signed   By: Genevie Ann M.D.   On: 01/22/2020 21:01   DG C-Arm 1-60 Min  Result Date: 01/22/2020 CLINICAL DATA:  Lumbar spine surgery. EXAM: LUMBAR SPINE - 2-3 VIEW; DG C-ARM 1-60 MIN COMPARISON:  CT 11/15/2019 and MRI 01/20/2020 FINDINGS: Eleven spot intraoperative images of the lumbar spine demonstrate placement of a spacer over the previous site of patient's known L1 compression fracture with associated posterior fusion hardware from T11 to at least L3 but possibly extending below the L3 level. Hardware is intact. There is normal alignment of the visualized thoracolumbar spine. IMPRESSION: Intraoperative findings as described with posterior fusion hardware intact from T11 to at least the L3 level bridging a spacer device at the L1 level. Recommend correlation with findings at the time of the procedure. Electronically Signed   By: Marin Olp M.D.   On: 01/22/2020 20:27    Assessment/Plan: Postop day #2: The patient seems to be progressing well.  We will mobilize her with PT.  She has a TLSO at the bedside.  LOS: 4 days     Ophelia Charter 01/24/2020, 11:18 AM

## 2020-01-24 NOTE — Progress Notes (Signed)
PT Progress Note for Charges    01/24/20 1600  PT Visit Information  Last PT Received On 01/24/20  PT General Charges  $$ ACUTE PT VISIT 1 Visit  PT Treatments  $Therapeutic Activity 23-37 mins  Anastasio Champion, DPT  Acute Rehabilitation Services Pager [phone] Office [phone]

## 2020-01-24 NOTE — Progress Notes (Signed)
Physical Therapy Treatment Patient Details Name: [name] [name] MRN: [id number] DOB: [birth year] Today's Date: 01/24/2020    History of Present Illness [name] [name] is a 64 y.o. adult with PMH signficant for diabetes mellitus type 2, chronic kidney disease stage III,  anemia, HTN, anxiety, GERD, fibromyalgia, depression, PTSD, and is legally blind. She was admitted for L1 osteomyelitis and spinal instability. S/p debridement and stabilization T11-12 & L2-3 (3/25)     PT Comments    Patient is slowly progressing with functional mobility. She was able to roll both L and R with min A- preferring to do it on her own. She required assistance +2 for donning the brace in supine prior to sitting EOB. She was able to transfer supine to sit while maintaining back precautions with min A to get trunk upright and scoot anteriorly. Upon sitting EOB, brace was ill-fitting, patient was uncomfortable and unable to tolerate additional mobility. Hopeful that patient will continue to progress with continued pain management and proper brace fit. Pt would continue to benefit from skilled physical therapy services at this time while admitted and after d/c to address the below listed limitations in order to improve overall safety and independence with functional mobility.   Follow Up Recommendations  SNF;Supervision/Assistance - 24 hour     Equipment Recommendations       Recommendations for Other Services       Precautions / Restrictions Precautions Precautions: Back;Fall Required Braces or Orthoses: Spinal Brace Spinal Brace: Thoracolumbosacral orthotic Restrictions Other Position/Activity Restrictions: TLSO donned in supine, must be worn when HOB >30deg    Mobility  Bed Mobility Overal bed mobility: Needs Assistance Bed Mobility: Rolling;Supine to Sit Rolling: Min assist;+2 for safety/equipment   Supine to sit: Mod assist     General bed mobility comments: Pt able to assist with rolling.  Min A +2 for rolling to maintain precautions and donning of TLSO in supine. Min A to sit EOB- assist to get trunk upright and scoot anteriorly. Pt requires inc time and explanation of planned movements  Transfers Overall transfer level: (deferred due to pain and brace adjustment)                  Ambulation/Gait                 Stairs             Wheelchair Mobility    Modified Rankin (Stroke Patients Only)       Balance Overall balance assessment: Needs assistance Sitting-balance support: Bilateral upper extremity supported;Feet supported Sitting balance-Leahy Scale: Fair Sitting balance - Comments: Pt able to sit without UE support if requested. Prefers B UE support. Requires VC to avoid posterior lean                                    Cognition Arousal/Alertness: Awake/alert Behavior During Therapy: Anxious;WFL for tasks assessed/performed Overall Cognitive Status: Within Functional Limits for tasks assessed                                 General Comments: Cognition WFL for tasks assessed       Exercises      General Comments General comments (skin integrity, edema, etc.): Spouse present initially      Pertinent Vitals/Pain Pain Assessment: Faces Faces Pain Scale: Hurts whole lot Pain Location:  lumbar region Pain Descriptors / Indicators: Aching;Discomfort;Operative site guarding;Sore Pain Intervention(s): Limited activity within patient's tolerance;Monitored during session;Premedicated before session;Repositioned    Home Living                      Prior Function            PT Goals (current goals can now be found in the care plan section) Acute Rehab PT Goals PT Goal Formulation: With patient Time For Goal Achievement: 02/06/20 Potential to Achieve Goals: Fair Progress towards PT goals: Progressing toward goals    Frequency    Min 5X/week      PT Plan Current plan remains appropriate     Co-evaluation              AM-PAC PT "6 Clicks" Mobility   Outcome Measure  Help needed turning from your back to your side while in a flat bed without using bedrails?: A Lot Help needed moving from lying on your back to sitting on the side of a flat bed without using bedrails?: A Lot Help needed moving to and from a bed to a chair (including a wheelchair)?: Total Help needed standing up from a chair using your arms (e.g., wheelchair or bedside chair)?: Total Help needed to walk in hospital room?: Total Help needed climbing 3-5 steps with a railing? : Total 6 Click Score: 8    End of Session   Activity Tolerance: Patient limited by pain;Patient tolerated treatment well Patient left: in bed;with bed alarm set;with call bell/phone within reach Nurse Communication: Mobility status PT Visit Diagnosis: Muscle weakness (generalized) (M62.81);Other abnormalities of gait and mobility (R26.89);Pain Pain - Right/Left: (lumbar) Pain - part of body: (back)     Time: 6387-5643 PT Time Calculation (min) (ACUTE ONLY): 23 min  Charges:  $Therapeutic Activity: (P) 23-37 mins                     Mechelle Pates, SPT Acute Rehab  [phone]   Yezenia Fredrick 01/24/2020, 4:04 PM

## 2020-01-25 ENCOUNTER — Inpatient Hospital Stay: Payer: Self-pay

## 2020-01-25 DIAGNOSIS — M4325 Fusion of spine, thoracolumbar region: Secondary | ICD-10-CM

## 2020-01-25 LAB — CBC
HCT: 23.7 % — ABNORMAL LOW (ref 36.0–46.0)
Hemoglobin: 7.2 g/dL — ABNORMAL LOW (ref 12.0–15.0)
MCH: 26.2 pg (ref 26.0–34.0)
MCHC: 30.4 g/dL (ref 30.0–36.0)
MCV: 86.2 fL (ref 80.0–100.0)
Platelets: 159 10*3/uL (ref 150–400)
RBC: 2.75 MIL/uL — ABNORMAL LOW (ref 3.87–5.11)
RDW: 15 % (ref 11.5–15.5)
WBC: 7 10*3/uL (ref 4.0–10.5)
nRBC: 0 % (ref 0.0–0.2)

## 2020-01-25 LAB — CULTURE, BLOOD (ROUTINE X 2)
Culture: NO GROWTH
Culture: NO GROWTH
Special Requests: ADEQUATE

## 2020-01-25 LAB — BASIC METABOLIC PANEL
Anion gap: 7 (ref 5–15)
BUN: 18 mg/dL (ref 8–23)
CO2: 24 mmol/L (ref 22–32)
Calcium: 8.2 mg/dL — ABNORMAL LOW (ref 8.9–10.3)
Chloride: 105 mmol/L (ref 98–111)
Creatinine, Ser: 1.42 mg/dL — ABNORMAL HIGH (ref 0.44–1.00)
GFR calc Af Amer: 45 mL/min — ABNORMAL LOW (ref >60)
GFR calc non Af Amer: 39 mL/min — ABNORMAL LOW (ref >60)
Glucose, Bld: 154 mg/dL — ABNORMAL HIGH (ref 70–99)
Potassium: 4.1 mmol/L (ref 3.5–5.1)
Sodium: 136 mmol/L (ref 135–145)

## 2020-01-25 LAB — TYPE AND SCREEN
ABO/RH(D): A NEG
Antibody Screen: NEGATIVE
Unit division: 0
Unit division: 0

## 2020-01-25 LAB — BPAM RBC
Blood Product Expiration Date: 202104112359
Blood Product Expiration Date: 202104122359
ISSUE DATE / TIME: 202103251446
ISSUE DATE / TIME: 202103251446
Unit Type and Rh: 600
Unit Type and Rh: 600

## 2020-01-25 LAB — GLUCOSE, CAPILLARY
Glucose-Capillary: 126 mg/dL — ABNORMAL HIGH (ref 70–99)
Glucose-Capillary: 137 mg/dL — ABNORMAL HIGH (ref 70–99)
Glucose-Capillary: 145 mg/dL — ABNORMAL HIGH (ref 70–99)
Glucose-Capillary: 193 mg/dL — ABNORMAL HIGH (ref 70–99)

## 2020-01-25 MED ORDER — SODIUM CHLORIDE 0.9% FLUSH: 10.0000 mL | Freq: Two times a day (BID) | INTRAVENOUS | Status: DC

## 2020-01-25 MED ORDER — CHLORHEXIDINE GLUCONATE CLOTH 2 % EX PADS
6.0000 | MEDICATED_PAD | Freq: Every day | CUTANEOUS | Status: DC
  Administered 2020-01-26 – 2020-01-29 (×4): 6 via TOPICAL

## 2020-01-25 MED ORDER — SODIUM CHLORIDE 0.9% FLUSH: 10.0000 mL | INTRAVENOUS | Status: DC | PRN

## 2020-01-25 NOTE — Progress Notes (Signed)
   Providing Compassionate, Quality Care - Together   Subjective: Patient reports she worked with physical therapy yesterday. Her husband is at the bedside. We briefly discussed PT's recommendation for skilled nursing facility at discharge.  Objective: Vital signs in last 24 hours: Temp:  [98 F (36.7 C)-98.3 F (36.8 C)] 98 F (36.7 C) (03/28 0816) Pulse Rate:  [86-103] 86 (03/28 0816) Resp:  [16-17] 16 (03/28 0816) BP: (119-153)/(58-72) 153/70 (03/28 0816) SpO2:  [90 %-94 %] 94 % (03/28 0816)  Intake/Output from previous day: 03/27 0701 - 03/28 0700 In: 1233.6 [P.O.:480; I.V.:653.6; IV Piggyback:100] Out: -  Intake/Output this shift: No intake/output data recorded.  Alert and oriented x 4 PERRLA CN II-XII grossly intact MAE, Strength and sensation intact in BUE; Strength and sensation improving in BLE Left heel diabetic foot ulcer covered with dressing Surgical incision at back is covered with Honeycomb dressing and Steri Strips; Dressing is with small amount of dried sanguinous drainage, otherwise dry and intact   Lab Results: Recent Labs    01/24/20 0253 01/25/20 0435  WBC 8.8 7.0  HGB 7.5* 7.2*  HCT 25.0* 23.7*  PLT 172 159   BMET Recent Labs    01/24/20 0253 01/25/20 0435  NA 138 136  K 4.9 4.1  CL 107 105  CO2 24 24  GLUCOSE 186* 154*  BUN 23 18  CREATININE 1.65* 1.42*  CALCIUM 8.4* 8.2*    Studies/Results: No results found.  Assessment/Plan: Patient is three days status post L1 corpectomy with percutaneous segmental instrumentation with pedicle screw fixation at T11, T12, L2, and L3 due to septic arthritis of the L1 vertebra. Cultures grew pseudomonas. ID is following. Right IJ CVC in place, with plan for PICC line. The patient is being treated with IV cefepime now. Vancomycin and rifampin were discontinued yesterday.   LOS: 5 days    -Continue to mobilize with therapies -Currently, recommendation is for SNF at discharge   Viona Gilmore,  [place], AGNP-C Nurse Practitioner  [facility] [place]. [address], [place], [place], [place] [address] P: [phone]    F: [phone]  01/25/2020, 11:13 AM

## 2020-01-25 NOTE — Plan of Care (Signed)
  Problem: Education: Goal: Knowledge of General Education information will improve Description: Including pain rating scale, medication(s)/side effects and non-pharmacologic comfort measures Outcome: Progressing   Problem: Health Behavior/Discharge Planning: Goal: Ability to manage health-related needs will improve Outcome: Progressing   Problem: Clinical Measurements: Goal: Ability to maintain clinical measurements within normal limits will improve Outcome: Progressing   Problem: Activity: Goal: Risk for activity intolerance will decrease Outcome: Progressing   Problem: Nutrition: Goal: Adequate nutrition will be maintained Outcome: Progressing   Problem: Elimination: Goal: Will not experience complications related to bowel motility Outcome: Progressing   Problem: Pain Managment: Goal: General experience of comfort will improve Outcome: Progressing   Problem: Safety: Goal: Ability to remain free from injury will improve Outcome: Progressing   Problem: Skin Integrity: Goal: Risk for impaired skin integrity will decrease Outcome: Progressing

## 2020-01-25 NOTE — Progress Notes (Deleted)
Peripherally Inserted Central Catheter Placement  The IV Nurse has discussed with the patient and/or persons authorized to consent for the patient, the purpose of this procedure and the potential benefits and risks involved with this procedure.  The benefits include less needle sticks, lab draws from the catheter, and the patient may be discharged home with the catheter. Risks include, but not limited to, infection, bleeding, blood clot (thrombus formation), and puncture of an artery; nerve damage and irregular heartbeat and possibility to perform a PICC exchange if needed/ordered by physician.  Alternatives to this procedure were also discussed.  Bard Power PICC patient education guide, fact sheet on infection prevention and patient information card has been provided to patient /or left at bedside. Husband signed consent per patient's request.    PICC Placement Documentation  PICC Single Lumen 04/21/19 PICC Right Other (Comment) 22 cm (Active)     PICC Single Lumen 01/25/20 PICC Right Cephalic 39 cm 0 cm (Active)  Indication for Insertion or Continuance of Line Home intravenous therapies (PICC only) 01/25/20 1700  Exposed Catheter (cm) 0 cm 01/25/20 1700  Site Assessment Clean;Dry;Intact 01/25/20 1700  Line Status Flushed;Blood return noted;Saline locked 01/25/20 1700  Dressing Type Transparent 01/25/20 1700  Dressing Status Clean;Dry;Intact;Antimicrobial disc in place 01/25/20 [place] checked and tightened 01/25/20 1700  Dressing Intervention New dressing 01/25/20 1700  Dressing Change Due 02/01/20 01/25/20 1700       Lorenza Cambridge 01/25/2020, 6:21 PM

## 2020-01-25 NOTE — TOC Progression Note (Signed)
Transition of Care [facility]) - Progression Note    Patient Details  Name: Asjia Berrios MRN: [id number] Date of Birth: 23-Oct-1956  Transition of Care [facility]) CM/SW [place], LCSW Phone Number: 01/25/2020, 10:18 AM  Clinical Narrative:     CSW spoke with patient's husband [name] to discuss patient's dispo plan. PT recommending SNF, however patient and family prefer patient returns home. Prior to this hospitalization patient had an aide from [facility].   Patient will need 4-6 weeks of IV abx at discharge. Carolynn Sayers, with [facility] following for IV abx at dc.   TOC will continue to follow for dispo planning.   Per [employer] is [employer]. Subscriber number is listed in Epic.   Expected Discharge Plan: [place] Barriers to Discharge: Continued Medical Work up  Expected Discharge Plan and Services Expected Discharge Plan: [place] In-house Referral: Clinical Social Work   Post Acute Care Choice: [place] arrangements for the past 2 months: Single Family Home                                       Social Determinants of Health (SDOH) Interventions    Readmission Risk Interventions Readmission Risk Prevention Plan 01/23/2020 11/18/2019  Transportation Screening Complete Complete  Medication Review [employer] photographer) Complete Referral to Pharmacy  PCP or Specialist appointment within 3-5 days of discharge Complete Complete  HRI or Home Care Consult Complete Complete  SW Recovery Care/Counseling Consult Complete Complete  Palliative Care Screening Complete Not [place] Complete Not Applicable  Some recent data might be hidden

## 2020-01-25 NOTE — Progress Notes (Signed)
PROGRESS NOTE    [name] [name]  SVX:[id number] DOB: 12/10/1955 DOA: 01/20/2020 PCP: Ann Held, DO    Brief Narrative:  [phone] with hx DM2, CKD stage 3, hx recent septic elbow with recent L1 compression fracture. Pt presents with worsened back pain, found to have L1 osteo. Neurosurgery and ID consulted  Assessment & Plan:   Principal Problem:   Osteomyelitis of vertebra of thoracolumbar region [facility]) Active Problems:   Essential hypertension   Hepatic cirrhosis (HCC)   CAD (coronary artery disease)   CKD (chronic kidney disease), stage III (HCC)   Ulcer of left heel, limited to breakdown of skin ([place])   Type 2 diabetes mellitus with hyperglycemia, with long-term current use of insulin (HCC)   Septic arthritis of elbow, right (HCC)   Paraspinal mass   Spinal stenosis of lumbar region  Principal Problem:   Osteomyelitis of vertebra of thoracolumbar region [facility]), L1 lumbar discitis with paraspinal fluid enhancement -Currently in significant pain, 8/10, worse on movement -Neurosurgery consulted, pt underwent surgery 3/25 -ID following, pseudomonas growing in tissue cx -Appreciate ID input. Recommendation for Cefepime through 03/05/20 to complete 6 weeks of tx  Active Problems: Diabetes mellitus type 2, uncontrolled with hyperglycemia -Will continue with BID Lantus dosing with sliding scale coverage -Glucose trends remains stable thus far -Hemoglobin A1c 12.5    CKD (chronic kidney disease), stage IIIb (HCC) -Baseline creatinine 1.5-1.6 -Renal function remains stable    Essential hypertension -BP somewhat elevated, placed on Norvasc 5 mg daily -continue with hydralazine IV as tolerated    Hepatic cirrhosis (HCC) -No acute issues currently    CAD (coronary artery disease) -Currently no chest pain or any anginal symptoms -Stable currently  DVT prophylaxis: Lovenox subq Code Status: Full Family Communication: Pt in room, family at bedside Disposition  Plan: From home, plan SNF when cleared by neurosurgery and ID with abx regimen finalized  Consultants:   Neurosurgery  ID  Procedures:   L1 corpectomy and reconstruction with expandable cage, T11-L3 percutaneous posterior instrumentation 3/25  Antimicrobials: Anti-infectives (From admission, onward)   Start     Dose/Rate Route Frequency Ordered Stop   01/24/20 1030  ceFEPIme (MAXIPIME) 2 g in sodium chloride 0.9 % 100 mL IVPB     2 g 200 mL/hr over 30 Minutes Intravenous Every 12 hours 01/24/20 1012     01/24/20 1000  ceFEPIme (MAXIPIME) 2 g in sodium chloride 0.9 % 100 mL IVPB  Status:  Discontinued     2 g 200 mL/hr over 30 Minutes Intravenous Every 8 hours 01/24/20 0939 01/24/20 1012   01/23/20 2200  vancomycin (VANCOREADY) IVPB 500 mg/100 mL  Status:  Discontinued     500 mg 100 mL/hr over 60 Minutes Intravenous Every 24 hours 01/22/20 2200 01/24/20 1540   01/23/20 1430  rifampin (RIFADIN) capsule 600 mg  Status:  Discontinued     600 mg Oral Daily 01/23/20 1339 01/24/20 1540   01/23/20 1000  Levofloxacin (LEVAQUIN) IVPB 250 mg  Status:  Discontinued     250 mg 50 mL/hr over 60 Minutes Intravenous Every 24 hours 01/22/20 2141 01/23/20 1339   01/22/20 2300  vancomycin (VANCOCIN) IVPB 1000 mg/200 mL premix     1,000 mg 200 mL/hr over 60 Minutes Intravenous To Post Anesthesia Care Unit 01/22/20 2200 01/23/20 2300   01/22/20 1505  bacitracin 50,000 Units in sodium chloride 0.9 % 500 mL irrigation  Status:  Discontinued       As needed 01/22/20 1505 01/22/20  2020   01/22/20 0800  vancomycin (VANCOCIN) IVPB 1000 mg/200 mL premix     1,000 mg 200 mL/hr over 60 Minutes Intravenous To ShortStay Surgical 01/22/20 0330 01/22/20 1435      Subjective: Still reporting back pain  Objective: Vitals:   01/24/20 2038 01/25/20 0330 01/25/20 0816 01/25/20 1508  BP: (!) 128/58 (!) 144/72 (!) 153/70 (!) 141/67  Pulse: (!) 103 94 86 89  Resp:   16 18  Temp: 98.3 F (36.8 C) 98 F (36.7  C) 98 F (36.7 C) 98 F (36.7 C)  TempSrc: Oral Oral    SpO2: 90% 92% 94% 96%  Weight:      Height:        Intake/Output Summary (Last 24 hours) at 01/25/2020 1657 Last data filed at 01/25/2020 1300 Gross per 24 hour  Intake 1233.6 ml  Output --  Net 1233.6 ml   Filed Weights   01/20/20 1051 01/20/20 2300  Weight: 81.6 kg 81.7 kg    Examination: General exam: Conversant, in no acute distress Respiratory system: normal chest rise, clear, no audible wheezing Cardiovascular system: regular rhythm, s1-s2 Gastrointestinal system: Nondistended, nontender, pos BS Central nervous system: No seizures, no tremors Extremities: No cyanosis, no joint deformities Skin: No rashes, no pallor Psychiatry: Affect normal // no auditory hallucinations   Data Reviewed: I have personally reviewed following labs and imaging studies  CBC: Recent Labs  Lab 01/21/20 0619 01/21/20 0619 01/22/20 0534 01/22/20 1503 01/22/20 1642 01/22/20 1822 01/23/20 0323 01/24/20 0253 01/25/20 0435  WBC 7.7  --  6.5  --   --   --  10.6* 8.8 7.0  NEUTROABS 5.2  --   --   --   --   --   --   --   --   HGB 8.8*   < > 8.8*   < > 8.2* 9.2* 9.2* 7.5* 7.2*  HCT 29.1*   < > 29.1*   < > 24.0* 27.0* 29.7* 25.0* 23.7*  MCV 84.6  --  83.9  --   --   --  84.9 85.9 86.2  PLT 211  --  209  --   --   --  204 172 159   < > = values in this interval not displayed.   Basic Metabolic Panel: Recent Labs  Lab 01/21/20 0619 01/21/20 0619 01/22/20 0534 01/22/20 1503 01/22/20 1642 01/22/20 1822 01/23/20 0323 01/24/20 0253 01/25/20 0435  NA 136   < > 139   < > 139 139 136 138 136  K 4.3   < > 4.5   < > 4.4 4.8 4.5 4.9 4.1  CL 102  --  103  --   --   --  100 107 105  CO2 26  --  28  --   --   --  23 24 24   GLUCOSE 163*  --  127*  --   --   --  302* 186* 154*  BUN 14  --  19  --   --   --  22 23 18   CREATININE 1.55*  --  1.58*  --   --   --  1.67* 1.65* 1.42*  CALCIUM 8.7*  --  8.9  --   --   --  8.6* 8.4* 8.2*   < >  = values in this interval not displayed.   GFR: Estimated Creatinine Clearance (by C-G formula based on SCr of 1.42 mg/dL (H)) Female: 38.8 mL/min (A) Female: 81.6  mL/min (A) Liver Function Tests: Recent Labs  Lab 01/20/20 1056 01/21/20 0619 01/22/20 0534  AST 9* 10* 10*  ALT 9 8 6   ALKPHOS 99 95 90  BILITOT 0.8 0.6 0.7  PROT 7.7 6.9 6.8  ALBUMIN 2.3* 2.1* 2.0*   Recent Labs  Lab 01/20/20 1056  LIPASE 37   No results for input(s): AMMONIA in the last 168 hours. Coagulation Profile: Recent Labs  Lab 01/22/20 0534  INR 1.2   Cardiac Enzymes: No results for input(s): CKTOTAL, CKMB, CKMBINDEX, TROPONINI in the last 168 hours. BNP (last 3 results) No results for input(s): PROBNP in the last 8760 hours. HbA1C: No results for input(s): HGBA1C in the last 72 hours. CBG: Recent Labs  Lab 01/24/20 1550 01/24/20 2040 01/25/20 0642 01/25/20 1153 01/25/20 1551  GLUCAP 191* 189* 126* 137* 145*   Lipid Profile: No results for input(s): CHOL, HDL, LDLCALC, TRIG, CHOLHDL, LDLDIRECT in the last 72 hours. Thyroid Function Tests: No results for input(s): TSH, T4TOTAL, FREET4, T3FREE, THYROIDAB in the last 72 hours. Anemia Panel: No results for input(s): VITAMINB12, FOLATE, FERRITIN, TIBC, IRON, RETICCTPCT in the last 72 hours. Sepsis Labs: No results for input(s): PROCALCITON, LATICACIDVEN in the last 168 hours.  Recent Results (from the past 240 hour(s))  Blood culture (routine x 2)     Status: None   Collection Time: 01/20/20  1:34 PM   Specimen: BLOOD RIGHT HAND  Result Value Ref Range Status   Specimen Description BLOOD RIGHT HAND  Final   Special Requests   Final    BOTTLES DRAWN AEROBIC ONLY Blood Culture results may not be optimal due to an inadequate volume of blood received in culture bottles   Culture   Final    NO GROWTH 5 DAYS Performed at [facility], [place] [address] [place], [place] [address]    Report Status 01/25/2020 FINAL  Final  Blood culture  (routine x 2)     Status: None   Collection Time: 01/20/20  3:28 PM   Specimen: BLOOD  Result Value Ref Range Status   Specimen Description BLOOD RIGHT ANTECUBITAL  Final   Special Requests   Final    BOTTLES DRAWN AEROBIC AND ANAEROBIC Blood Culture adequate volume   Culture   Final    NO GROWTH 5 DAYS Performed at [facility], [address], [place], [place] [address]    Report Status 01/25/2020 FINAL  Final  SARS CORONAVIRUS 2 (TAT 6-24 HRS) Nasopharyngeal Nasopharyngeal Swab     Status: None   Collection Time: 01/20/20  3:37 PM   Specimen: Nasopharyngeal Swab  Result Value Ref Range Status   SARS Coronavirus 2 NEGATIVE NEGATIVE Final    Comment: (NOTE) SARS-CoV-2 target nucleic acids are NOT DETECTED. The SARS-CoV-2 RNA is generally detectable in upper and lower respiratory specimens during the acute phase of infection. Negative results do not preclude SARS-CoV-2 infection, do not rule out co-infections with other pathogens, and should not be used as the sole basis for treatment or other patient management decisions. Negative results must be combined with clinical observations, patient history, and epidemiological information. The expected result is Negative. Fact Sheet for Patients: SugarRoll.be Fact Sheet for Healthcare Providers: https://www.woods-mathews.com/ This test is not yet approved or cleared by the [place] FDA and  has been authorized for detection and/or diagnosis of SARS-CoV-2 by FDA under an Emergency Use Authorization (EUA). This EUA will remain  in effect (meaning this test can be used) for the duration  of the COVID-19 declaration under Section 56 4(b)(1) of the Act, 21 U.S.C. section 360bbb-3(b)(1), unless the authorization is terminated or revoked sooner. Performed at [facility], [place] [address], [place], [place] [address]   MRSA PCR Screening     Status: None   Collection Time: 01/21/20   5:25 AM   Specimen: Nasal Mucosa; Nasopharyngeal  Result Value Ref Range Status   MRSA by PCR NEGATIVE NEGATIVE Final    Comment:        The GeneXpert MRSA Assay (FDA approved for NASAL specimens only), is one component of a comprehensive MRSA colonization surveillance program. It is not intended to diagnose MRSA infection nor to guide or monitor treatment for MRSA infections. Performed at [facility], [place] [address] [place], [place] [address]   Surgical pcr screen     Status: Abnormal   Collection Time: 01/21/20  3:24 PM   Specimen: Nasal Mucosa; Nasal Swab  Result Value Ref Range Status   MRSA, PCR NEGATIVE NEGATIVE Final   Staphylococcus aureus POSITIVE (A) NEGATIVE Final    Comment: (NOTE) The Xpert SA Assay (FDA approved for NASAL specimens in patients 66 years of age and older), is one component of a comprehensive surveillance program. It is not intended to diagnose infection nor to guide or monitor treatment. Performed at [facility], [place] [address] [place], [place] [address]   Surgical pcr screen     Status: Abnormal   Collection Time: 01/22/20  3:46 AM   Specimen: Nasal Mucosa; Nasal Swab  Result Value Ref Range Status   MRSA, PCR NEGATIVE NEGATIVE Final   Staphylococcus aureus POSITIVE (A) NEGATIVE Final    Comment: CRITICAL RESULT CALLED TO, READ BACK BY AND VERIFIED WITH: RN DAVID Mamie Nick [phone] @0610  THANEY Performed at [facility], [address] [place], [place] [address]   Aerobic/Anaerobic Culture (surgical/deep wound)     Status: None (Preliminary result)   Collection Time: 01/22/20  3:25 PM   Specimen: PATH Bone biopsy; Tissue  Result Value Ref Range Status   Specimen Description TISSUE  Final   Special Requests LUMBAR 1  Final   Gram Stain   Final    NO WBC SEEN RARE GRAM POSITIVE COCCI Performed at [facility], [address] [place], [place] [address]    Culture   Final    RARE PSEUDOMONAS AERUGINOSA NO ANAEROBES  ISOLATED; CULTURE IN PROGRESS FOR 5 DAYS    Report Status PENDING  Incomplete   Organism ID, Bacteria PSEUDOMONAS AERUGINOSA  Final      Susceptibility   Pseudomonas aeruginosa - MIC*    CEFTAZIDIME 4 SENSITIVE Sensitive     CIPROFLOXACIN <=0.25 SENSITIVE Sensitive     GENTAMICIN <=1 SENSITIVE Sensitive     IMIPENEM 1 SENSITIVE Sensitive     PIP/TAZO 8 SENSITIVE Sensitive     CEFEPIME 2 SENSITIVE Sensitive     * RARE PSEUDOMONAS AERUGINOSA  Acid Fast Smear (AFB)     Status: None   Collection Time: 01/22/20  3:25 PM   Specimen: PATH Bone biopsy; Tissue  Result Value Ref Range Status   AFB Specimen Processing Comment  Final    Comment: Tissue Grinding and Digestion/Decontamination   Acid Fast Smear Negative  Final    Comment: (NOTE) Performed At: [facility] [place], [place] [id number] Rush Farmer MD [id number]    Source (AFB) TISSUE  Final    Comment: LUMBAR 1 Performed at [facility], [place] [address], [place], [place] [address]   Aerobic/Anaerobic Culture (surgical/deep wound)     Status: None (Preliminary result)   Collection Time: 01/22/20  5:06 PM   Specimen: PATH Bone biopsy; Tissue  Result Value Ref Range Status   Specimen Description TISSUE  Final   Special Requests 2 LUMBAR 1  Final   Gram Stain   Final    NO WBC SEEN NO ORGANISMS SEEN Performed at [facility], [address] [place], [place] [address]    Culture   Final    RARE PSEUDOMONAS AERUGINOSA CRITICAL RESULT CALLED TO, READ BACK BY AND VERIFIED WITH: RN Lenna Sciara [id number] FCP NO ANAEROBES ISOLATED; CULTURE IN PROGRESS FOR 5 DAYS    Report Status PENDING  Incomplete   Organism ID, Bacteria PSEUDOMONAS AERUGINOSA  Final      Susceptibility   Pseudomonas aeruginosa - MIC*    CEFTAZIDIME 4 SENSITIVE Sensitive     CIPROFLOXACIN 0.5 SENSITIVE Sensitive     GENTAMICIN <=1 SENSITIVE Sensitive     IMIPENEM 1 SENSITIVE Sensitive     PIP/TAZO 8 SENSITIVE Sensitive      CEFEPIME 2 SENSITIVE Sensitive     * RARE PSEUDOMONAS AERUGINOSA  Acid Fast Smear (AFB)     Status: None   Collection Time: 01/22/20  5:06 PM   Specimen: PATH Bone biopsy; Tissue  Result Value Ref Range Status   AFB Specimen Processing Comment  Final    Comment: Tissue Grinding and Digestion/Decontamination   Acid Fast Smear Negative  Final    Comment: (NOTE) Performed At: [facility] [place], [place] [id number] Rush Farmer MD WY:[id number]    Source (AFB) 2  Final    Comment: TISSUE LUMBAR 1 Performed at [facility], [place] [address] [place], [place] [address]      Radiology Studies: [name] EKG SITE RITE  Result Date: 01/25/2020 If [facility] image not attached, placement could not be confirmed due to current cardiac rhythm.   Scheduled Meds: . amLODipine  5 mg Oral Daily  . atorvastatin  10 mg Oral Daily  . docusate sodium  100 mg Oral BID  . DULoxetine  60 mg Oral Daily  . enoxaparin (LOVENOX) injection  40 mg Subcutaneous Q24H  . feeding supplement (GLUCERNA SHAKE)  237 mL Oral TID BM  . insulin aspart  0-9 Units Subcutaneous TID WC  . insulin glargine  20 Units Subcutaneous Daily  . insulin glargine  30 Units Subcutaneous Q2200  . lidocaine  1 patch Transdermal Q24H  . multivitamin with minerals  1 tablet Oral Daily  . pregabalin  150 mg Oral BID  . senna  1 tablet Oral BID  . sodium chloride flush  3 mL Intravenous Q12H   Continuous Infusions: . sodium chloride Stopped (01/22/20 2326)  . 0.9 % NaCl with KCl 20 mEq / L Stopped (01/24/20 2016)  . ceFEPime (MAXIPIME) IV 2 g (01/25/20 0946)     LOS: 5 days   Marylu Lund, MD [facility] Pager On Amion  If 7PM-7AM, please contact night-coverage 01/25/2020, 4:57 PM

## 2020-01-25 NOTE — Progress Notes (Signed)
Peripherally Inserted Central Catheter Placement  The IV Nurse has discussed with the patient and/or persons authorized to consent for the patient, the purpose of this procedure and the potential benefits and risks involved with this procedure.  The benefits include less needle sticks, lab draws from the catheter, and the patient may be discharged home with the catheter. Risks include, but not limited to, infection, bleeding, blood clot (thrombus formation), and puncture of an artery; nerve damage and irregular heartbeat and possibility to perform a PICC exchange if needed/ordered by physician.  Alternatives to this procedure were also discussed.  Bard Power PICC patient education guide, fact sheet on infection prevention and patient information card has been provided to patient /or left at bedside. Consent signed by husband per patient's request.   PICC Placement Documentation  PICC Single Lumen 04/21/19 PICC Right Other (Comment) 22 cm (Active)     PICC Single Lumen 01/25/20 PICC Right Cephalic 39 cm 0 cm (Active)  Indication for Insertion or Continuance of Line Home intravenous therapies (PICC only) 01/25/20 1700  Exposed Catheter (cm) 0 cm 01/25/20 1700  Site Assessment Clean;Dry;Intact 01/25/20 1700  Line Status Flushed;Blood return noted;Saline locked 01/25/20 1700  Dressing Type Transparent 01/25/20 1700  Dressing Status Clean;Dry;Intact;Antimicrobial disc in place 01/25/20 [place] checked and tightened 01/25/20 1700  Dressing Intervention New dressing 01/25/20 1700  Dressing Change Due 02/01/20 01/25/20 1700       Lorenza [place] 01/25/2020, 6:03 PM

## 2020-01-25 NOTE — Plan of Care (Signed)
  Problem: Education: Goal: Knowledge of General Education information will improve Description Including pain rating scale, medication(s)/side effects and non-pharmacologic comfort measures Outcome: Progressing   

## 2020-01-25 NOTE — Progress Notes (Signed)
Spoke with Judeen Hammans, RN c/o PICC order for home antibiotics. No discharge planned at this time. Pt. Has a CVC and PIV for access.

## 2020-01-25 NOTE — Progress Notes (Addendum)
Diagnosis: Hardware associated vertebral infection  Culture Result: Pseudomonas aeruginosa  Allergies  Allergen Reactions  . Bee Pollen Anaphylaxis  . Fish-Derived Products Hives, Shortness Of Breath, Swelling and Rash    Hives get in throat causing trouble breathing  . Mushroom Extract Complex Anaphylaxis  . Penicillins Anaphylaxis    Did it involve swelling of the face/tongue/throat, SOB, or low BP? Yes Did it involve sudden or severe rash/hives, skin peeling, or any reaction on the inside of your mouth or nose? No Did you need to seek medical attention at a hospital or doctor's office? Yes When did it last happen?A few months ago If all above answers are "NO", may proceed with cephalosporin use.  [name] Kitchen Rosemary Oil Anaphylaxis  . Shellfish Allergy Hives, Shortness Of Breath, Swelling and Rash  . Tomato Hives and Shortness Of Breath    Hives in throat causes her trouble breathing  . Acetaminophen Other (See Comments)    GI upset  . Acyclovir And Related Other (See Comments)    Unknown rxn  . Aloe Vera Hives  . Broccoli [Brassica Oleracea] Hives  . Naproxen Other (See Comments)    Unknown rxn    OPAT Orders Discharge antibiotics:  Cefepime Duration:  6 weeks End Date: 03/05/2020   [facility] Care Per Protocol:    Labs  weekly while on IV antibiotics: _x_ CBC with differential _x_ BMP w GFR/CMP _x_ CRP _x_ ESR    __ Please pull PIC at completion of IV antibiotics x__ Please leave PIC in place until doctor has seen patient or been notified  Fax weekly labs to [phone]  Clinic Follow Up Appt:   [name] [name] has an appointment on 02/19/2020 with Dr. Megan Salon at [place] [facility] is located in the [facility] at  [address] in [place].  [address], which is located to the left of the elevators.  Phone: [phone]  Fax: [phone]  [email or web address]  She should  arrive 15 minutes prior to her appt.  We will sign off for now  Please call with further questions.

## 2020-01-25 NOTE — Progress Notes (Signed)
PHARMACY CONSULT NOTE FOR:  OUTPATIENT  PARENTERAL ANTIBIOTIC THERAPY (OPAT)  Indication: Hardware associated vertebral infection Regimen: Cefepime 2g q 12 hrs End date: 03/05/2020  IV antibiotic discharge orders are pended. To discharging provider:  please sign these orders via discharge navigator,  Select New Orders & click on the button choice - Manage This Unsigned Work.     Thank you for allowing pharmacy to be a part of this patient's care.  Marguerite Olea, [facility] Clinical Pharmacist Phone [phone]  01/25/2020 4:26 PM

## 2020-01-26 LAB — COMPREHENSIVE METABOLIC PANEL
ALT: 11 U/L (ref 0–44)
AST: 13 U/L — ABNORMAL LOW (ref 15–41)
Albumin: 1.7 g/dL — ABNORMAL LOW (ref 3.5–5.0)
Alkaline Phosphatase: 69 U/L (ref 38–126)
Anion gap: 8 (ref 5–15)
BUN: 14 mg/dL (ref 8–23)
CO2: 25 mmol/L (ref 22–32)
Calcium: 8.6 mg/dL — ABNORMAL LOW (ref 8.9–10.3)
Chloride: 105 mmol/L (ref 98–111)
Creatinine, Ser: 1.33 mg/dL — ABNORMAL HIGH (ref 0.44–1.00)
GFR calc Af Amer: 49 mL/min — ABNORMAL LOW (ref >60)
GFR calc non Af Amer: 42 mL/min — ABNORMAL LOW (ref >60)
Glucose, Bld: 93 mg/dL (ref 70–99)
Potassium: 3.8 mmol/L (ref 3.5–5.1)
Sodium: 138 mmol/L (ref 135–145)
Total Bilirubin: 0.7 mg/dL (ref 0.3–1.2)
Total Protein: 6.1 g/dL — ABNORMAL LOW (ref 6.5–8.1)

## 2020-01-26 LAB — CBC
HCT: 25.1 % — ABNORMAL LOW (ref 36.0–46.0)
Hemoglobin: 7.8 g/dL — ABNORMAL LOW (ref 12.0–15.0)
MCH: 25.7 pg — ABNORMAL LOW (ref 26.0–34.0)
MCHC: 31.1 g/dL (ref 30.0–36.0)
MCV: 82.6 fL (ref 80.0–100.0)
Platelets: 186 10*3/uL (ref 150–400)
RBC: 3.04 MIL/uL — ABNORMAL LOW (ref 3.87–5.11)
RDW: 15 % (ref 11.5–15.5)
WBC: 6.3 10*3/uL (ref 4.0–10.5)
nRBC: 0 % (ref 0.0–0.2)

## 2020-01-26 LAB — GLUCOSE, CAPILLARY
Glucose-Capillary: 86 mg/dL (ref 70–99)
Glucose-Capillary: 115 mg/dL — ABNORMAL HIGH (ref 70–99)
Glucose-Capillary: 139 mg/dL — ABNORMAL HIGH (ref 70–99)
Glucose-Capillary: 216 mg/dL — ABNORMAL HIGH (ref 70–99)

## 2020-01-26 LAB — SURGICAL PATHOLOGY

## 2020-01-26 MED ORDER — SODIUM CHLORIDE 0.9 % IV SOLN
2.0000 g | Freq: Two times a day (BID) | INTRAVENOUS | Status: DC
  Administered 2020-01-26 – 2020-01-29 (×6): 2 g via INTRAVENOUS
  Filled 2020-01-26 (×7): qty 2

## 2020-01-26 MED ORDER — SODIUM CHLORIDE 0.9 % IV SOLN: 2.0000 g | Freq: Three times a day (TID) | INTRAVENOUS | Status: DC

## 2020-01-26 NOTE — Plan of Care (Signed)
  Problem: Education: Goal: Knowledge of General Education information will improve Description: Including pain rating scale, medication(s)/side effects and non-pharmacologic comfort measures Outcome: Progressing   Problem: Health Behavior/Discharge Planning: Goal: Ability to manage health-related needs will improve Outcome: Progressing   Problem: Clinical Measurements: Goal: Will remain free from infection Outcome: Progressing   Problem: Activity: Goal: Risk for activity intolerance will decrease Outcome: Progressing   Problem: Pain Managment: Goal: General experience of comfort will improve Outcome: Progressing   Problem: Safety: Goal: Ability to remain free from injury will improve Outcome: Progressing   Problem: Skin Integrity: Goal: Risk for impaired skin integrity will decrease Outcome: Progressing

## 2020-01-26 NOTE — NC FL2 (Signed)
[place] LEVEL OF CARE SCREENING TOOL     IDENTIFICATION  Patient Name: [name] [name] Birthdate: Apr 18, 1956 Sex: adult Admission Date (Current Location): 01/20/2020  [facility] and [place] Number:  Herbalist and Address:  [facility] [place], [place] [address], [place], [place] [address]      Provider Number: [id number]  Attending Physician Name and Address:  Donne Hazel, MD  Relative Name and Phone Number:  Trianna Lupien, husband  [phone]    Current Level of Care: Hospital Recommended Level of Care: [place] Prior Approval Number:    Date Approved/Denied:   PASRR Number:    Discharge Plan: SNF    Current Diagnoses: Patient Active Problem List   Diagnosis Date Noted  . Spinal stenosis of lumbar region 01/22/2020  . Osteomyelitis of vertebra of thoracolumbar region ([place]) 01/21/2020  . Paraspinal mass 01/20/2020  . Back pain 11/17/2019  . Compression fracture of L1 lumbar vertebra ([place]) 11/17/2019  . Fracture of multiple ribs 11/17/2019  . Intractable nausea and vomiting 11/16/2019  . ARF (acute renal failure) ([place]) 11/15/2019  . Nausea & vomiting 11/15/2019  . Pressure injury of skin 04/17/2019  . Septic arthritis of elbow, right ([place]) 04/17/2019  . 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 Sleep apnea 11/16/2017  . Diabetic foot infection ([place]) 04/06/2017  . Type 2 diabetes mellitus with hyperglycemia, with long-term current use of insulin ([place]) 04/06/2017  . Ulcer of left heel, limited to breakdown of skin ([place]) 02/01/2017  . Neuropathy 05/05/2015  . Diabetic peripheral neuropathy ([place]) 01/12/2015  . CKD (chronic kidney disease), stage III ([place]) 05/27/2014  . History of MI (myocardial infarction) 10/06/2013  . Nausea and vomiting 10/06/2013  . Obesity (BMI 30-39.9) 09/20/2013  . 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 Normocytic anemia 02/03/2013  . CAD (coronary artery  disease) 01/11/2012  . Hepatic cirrhosis ([place]) 07/06/2010  . 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  . Hyperlipidemia 06/03/2008  . Essential hypertension 12/23/2006    Orientation RESPIRATION BLADDER Height & Weight     Self, Time, Situation  Normal Continent Weight: 81.7 kg Height:  5' 1"  (154.9 cm)  BEHAVIORAL SYMPTOMS/MOOD NEUROLOGICAL BOWEL NUTRITION STATUS      Continent Diet(See discharge summary)  AMBULATORY STATUS COMMUNICATION OF NEEDS Skin   Limited Assist Verbally Surgical wounds                       Personal Care Assistance Level of Assistance  Dressing, Bathing Bathing Assistance: Limited assistance   Dressing Assistance: Limited assistance     Functional Limitations Info             SPECIAL CARE FACTORS FREQUENCY  PT (By licensed PT), OT (By licensed OT)     PT Frequency: 5 times per week OT Frequency: 5 times per week            Contractures Contractures Info: Not present    Additional Factors Info  Allergies, Code Status Code Status Info: Full Allergies Info: bee pollen, fish, mushrooms, PCN, rosemary, shellfish, tomato,acetaminophen, acyclovir, broccoli, naproxen, aloe vera           Current Medications (01/26/2020):  This is the current hospital active medication list Current Facility-Administered Medications  Medication Dose Route Frequency Provider Last Rate Last Admin  . 0.9 %  sodium chloride infusion  250 mL Intravenous Continuous Vallarie Mare, MD   Stopped at 01/22/20 2326  . 0.9 % NaCl  with KCl 20 mEq/ L  infusion   Intravenous Continuous Vallarie Mare, MD   Stopped at 01/24/20 2016  . acetaminophen (TYLENOL) tablet 650 mg  650 mg Oral Q6H PRN Donne Hazel, MD   650 mg at 01/24/20 2030  . amLODipine (NORVASC) tablet 5 mg  5 mg Oral Daily Vallarie Mare, MD   5 mg at 01/26/20 0973  . atorvastatin (LIPITOR) tablet 10 mg  10 mg Oral Daily Vallarie Mare, MD   10 mg at 01/26/20 5329  . bisacodyl (DULCOLAX) suppository  10 mg  10 mg Rectal Daily PRN Vallarie Mare, MD      . ceFEPIme (MAXIPIME) 2 g in sodium chloride 0.9 % 100 mL IVPB  2 g Intravenous [address] [place]      . Chlorhexidine Gluconate Cloth 2 % PADS 6 each  6 each Topical Daily Donne Hazel, MD   6 each at 01/26/20 [phone]  . diazepam (VALIUM) tablet 5 mg  5 mg Oral Q6H PRN Vallarie Mare, MD   5 mg at 01/23/20 1749  . docusate sodium (COLACE) capsule 100 mg  100 mg Oral BID Vallarie Mare, MD   100 mg at 01/26/20 6834  . DULoxetine (CYMBALTA) DR capsule 60 mg  60 mg Oral Daily Vallarie Mare, MD   60 mg at 01/26/20 1962  . enoxaparin (LOVENOX) injection 40 mg  40 mg Subcutaneous Q24H Vallarie Mare, MD   40 mg at 01/26/20 2297  . feeding supplement (GLUCERNA SHAKE) (GLUCERNA SHAKE) liquid 237 mL  237 mL Oral TID BM Vallarie Mare, MD   237 mL at 01/26/20 1238  . hydrALAZINE (APRESOLINE) injection 10 mg  10 mg Intravenous Q6H PRN Vallarie Mare, MD      . HYDROmorphone (DILAUDID) injection 0.5 mg  0.5 mg Intravenous Q2H PRN Vallarie Mare, MD   0.5 mg at 01/24/20 0604  . insulin aspart (novoLOG) injection 0-9 Units  0-9 Units Subcutaneous TID WC Vallarie Mare, MD   1 Units at 01/25/20 1608  . insulin glargine (LANTUS) injection 20 Units  20 Units Subcutaneous Daily Vallarie Mare, MD   20 Units at 01/26/20 [phone]  . insulin glargine (LANTUS) injection 30 Units  30 Units Subcutaneous Q2200 Vallarie Mare, MD   30 Units at 01/25/20 2203  . lidocaine (LIDODERM) 5 % 1 patch  1 patch Transdermal Q24H Vallarie Mare, MD   1 patch at 01/26/20 0029  . menthol-cetylpyridinium (CEPACOL) lozenge 3 mg  1 lozenge Oral PRN Vallarie Mare, MD       Or  . phenol [facility]) mouth spray 1 spray  1 spray Mouth/Throat PRN Vallarie Mare, MD      . multivitamin with minerals tablet 1 tablet  1 tablet Oral Daily Vallarie Mare, MD   1 tablet at 01/26/20 [phone]  . ondansetron (ZOFRAN) tablet 4 mg  4  mg Oral Q6H PRN Vallarie Mare, MD       Or  . ondansetron [facility]) injection 4 mg  4 mg Intravenous Q6H PRN Vallarie Mare, MD      . ondansetron [facility]) tablet 4 mg  4 mg Oral Q6H PRN Vallarie Mare, MD       Or  . ondansetron [facility]) injection 4 mg  4 mg Intravenous Q6H PRN Vallarie Mare, MD      . oxyCODONE (Oxy IR/ROXICODONE) immediate release tablet 10 mg  10 mg Oral Q3H PRN Vallarie Mare, MD   10 mg at 01/26/20 1529  . oxyCODONE (Oxy IR/ROXICODONE) immediate release tablet 5 mg  5 mg Oral Q3H PRN Vallarie Mare, MD   5 mg at 01/23/20 1528  . polyethylene glycol (MIRALAX / GLYCOLAX) packet 17 g  17 g Oral Daily PRN Vallarie Mare, MD   17 g at 01/25/20 1616  . pregabalin (LYRICA) capsule 150 mg  150 mg Oral BID Vallarie Mare, MD   150 mg at 01/26/20 0981  . senna (SENOKOT) tablet 8.6 mg  1 tablet Oral BID Vallarie Mare, MD   8.6 mg at 01/26/20 1914  . sodium chloride flush (NS) 0.9 % injection 10-40 mL  10-40 mL Intracatheter PRN Donne Hazel, MD      . sodium chloride flush (NS) 0.9 % injection 3 mL  3 mL Intravenous Q12H Vallarie Mare, MD   3 mL at 01/26/20 0939  . sodium chloride flush (NS) 0.9 % injection 3 mL  3 mL Intravenous PRN Vallarie Mare, MD      . sodium phosphate (FLEET) 7-19 GM/118ML enema 1 enema  1 enema Rectal Once PRN Vallarie Mare, MD         Discharge Medications: Please see discharge summary for a list of discharge medications.  Relevant Imaging Results:  Relevant Lab Results:   Additional Information SS [id number]  Curlene Labrum, RN

## 2020-01-26 NOTE — Progress Notes (Signed)
Neurosurgery Service Progress Note  Subjective: No acute events overnight, having expected low back / left sided abdominal / pain with hip flexion, no new numbness / paresthesias / weakness in the BLE.   Objective: Vitals:   01/25/20 2114 01/26/20 0239 01/26/20 0835 01/26/20 1622  BP: 136/60 (!) 163/68 (!) 159/72 (!) 143/61  Pulse: 95 91 82 86  Resp:   14 15  Temp: 99.1 F (37.3 C) 98.6 F (37 C) 98.2 F (36.8 C) 98.4 F (36.9 C)  TempSrc: Oral Oral Oral Oral  SpO2: 93% 91% 94% 94%  Weight:      Height:       Temp (24hrs), Avg:98.6 F (37 C), Min:98.2 F (36.8 C), Max:99.1 F (37.3 C)  CBC Latest Ref Rng & Units 01/26/2020 01/25/2020 01/24/2020  WBC 4.0 - 10.5 K/uL 6.3 7.0 8.8  Hemoglobin 12.0 - 15.0 g/dL 7.8(L) 7.2(L) 7.5(L)  Hematocrit 36.0 - 46.0 % 25.1(L) 23.7(L) 25.0(L)  Platelets 150 - 400 K/uL 186 159 172   BMP Latest Ref Rng & Units 01/26/2020 01/25/2020 01/24/2020  Glucose 70 - 99 mg/dL 93 154(H) 186(H)  BUN 8 - 23 mg/dL 14 18 23   Creatinine 0.44 - 1.00 mg/dL 1.33(H) 1.42(H) 1.65(H)  BUN/Creat Ratio 6 - 22 (calc) - - -  Sodium 135 - 145 mmol/L 138 136 138  Potassium 3.5 - 5.1 mmol/L 3.8 4.1 4.9  Chloride 98 - 111 mmol/L 105 105 107  CO2 22 - 32 mmol/L 25 24 24   Calcium 8.9 - 10.3 mg/dL 8.6(L) 8.2(L) 8.4(L)    Intake/Output Summary (Last 24 hours) at 01/26/2020 1821 Last data filed at 01/26/2020 1508 Gross per 24 hour  Intake 1063 ml  Output --  Net 1063 ml    Current Facility-Administered Medications:  .  0.9 %  sodium chloride infusion, 250 mL, Intravenous, Continuous, Vallarie Mare, MD, Stopped at 01/22/20 2326 .  0.9 % NaCl with KCl 20 mEq/ L  infusion, , Intravenous, Continuous, Vallarie Mare, MD, Stopped at 01/24/20 2016 .  acetaminophen (TYLENOL) tablet 650 mg, 650 mg, Oral, Q6H PRN, Donne Hazel, MD, 650 mg at 01/24/20 2030 .  amLODipine (NORVASC) tablet 5 mg, 5 mg, Oral, Daily, Vallarie Mare, MD, 5 mg at 01/26/20 [phone] .  atorvastatin  (LIPITOR) tablet 10 mg, 10 mg, Oral, Daily, Vallarie Mare, MD, 10 mg at 01/26/20 0939 .  bisacodyl (DULCOLAX) suppository 10 mg, 10 mg, Rectal, Daily PRN, Vallarie Mare, MD .  ceFEPIme (MAXIPIME) 2 g in sodium chloride 0.9 % 100 mL IVPB, 2 g, Intravenous, Q12H, Alvira Philips, [place] .  Chlorhexidine Gluconate Cloth 2 % PADS 6 each, 6 each, Topical, Daily, Donne Hazel, MD, 6 each at 01/26/20 [phone] .  diazepam (VALIUM) tablet 5 mg, 5 mg, Oral, Q6H PRN, Vallarie Mare, MD, 5 mg at 01/23/20 1749 .  docusate sodium (COLACE) capsule 100 mg, 100 mg, Oral, BID, Vallarie Mare, MD, 100 mg at 01/26/20 0630 .  DULoxetine (CYMBALTA) DR capsule 60 mg, 60 mg, Oral, Daily, Vallarie Mare, MD, 60 mg at 01/26/20 1601 .  enoxaparin (LOVENOX) injection 40 mg, 40 mg, Subcutaneous, Q24H, Vallarie Mare, MD, 40 mg at 01/26/20 0932 .  feeding supplement (GLUCERNA SHAKE) (GLUCERNA SHAKE) liquid 237 mL, 237 mL, Oral, TID BM, Vallarie Mare, MD, 237 mL at 01/26/20 1238 .  hydrALAZINE (APRESOLINE) injection 10 mg, 10 mg, Intravenous, Q6H PRN, Vallarie Mare, MD .  HYDROmorphone (DILAUDID) injection 0.5  mg, 0.5 mg, Intravenous, Q2H PRN, Vallarie Mare, MD, 0.5 mg at 01/24/20 0604 .  insulin aspart (novoLOG) injection 0-9 Units, 0-9 Units, Subcutaneous, TID WC, Vallarie Mare, MD, 1 Units at 01/25/20 1608 .  insulin glargine (LANTUS) injection 20 Units, 20 Units, Subcutaneous, Daily, Vallarie Mare, MD, 20 Units at 01/26/20 [phone] .  insulin glargine (LANTUS) injection 30 Units, 30 Units, Subcutaneous, Q2200, Vallarie Mare, MD, 30 Units at 01/25/20 2203 .  lidocaine (LIDODERM) 5 % 1 patch, 1 patch, Transdermal, Q24H, Vallarie Mare, MD, 1 patch at 01/26/20 0029 .  menthol-cetylpyridinium (CEPACOL) lozenge 3 mg, 1 lozenge, Oral, PRN **OR** phenol (CHLORASEPTIC) mouth spray 1 spray, 1 spray, Mouth/Throat, PRN, Vallarie Mare, MD .  multivitamin with minerals tablet 1  tablet, 1 tablet, Oral, Daily, Vallarie Mare, MD, 1 tablet at 01/26/20 [phone] .  ondansetron (ZOFRAN) tablet 4 mg, 4 mg, Oral, Q6H PRN **OR** ondansetron (ZOFRAN) injection 4 mg, 4 mg, Intravenous, Q6H PRN, Vallarie Mare, MD .  ondansetron [facility]) tablet 4 mg, 4 mg, Oral, Q6H PRN **OR** ondansetron (ZOFRAN) injection 4 mg, 4 mg, Intravenous, Q6H PRN, Vallarie Mare, MD .  oxyCODONE (Oxy IR/ROXICODONE) immediate release tablet 10 mg, 10 mg, Oral, Q3H PRN, Vallarie Mare, MD, 10 mg at 01/26/20 1529 .  oxyCODONE (Oxy IR/ROXICODONE) immediate release tablet 5 mg, 5 mg, Oral, Q3H PRN, Vallarie Mare, MD, 5 mg at 01/23/20 1528 .  polyethylene glycol (MIRALAX / GLYCOLAX) packet 17 g, 17 g, Oral, Daily PRN, Vallarie Mare, MD, 17 g at 01/25/20 1616 .  pregabalin (LYRICA) capsule 150 mg, 150 mg, Oral, BID, Vallarie Mare, MD, 150 mg at 01/26/20 0867 .  senna (SENOKOT) tablet 8.6 mg, 1 tablet, Oral, BID, Vallarie Mare, MD, 8.6 mg at 01/26/20 6195 .  sodium chloride flush (NS) 0.9 % injection 10-40 mL, 10-40 mL, Intracatheter, PRN, Donne Hazel, MD .  sodium chloride flush (NS) 0.9 % injection 3 mL, 3 mL, Intravenous, Q12H, Vallarie Mare, MD, 3 mL at 01/26/20 0939 .  sodium chloride flush (NS) 0.9 % injection 3 mL, 3 mL, Intravenous, PRN, Vallarie Mare, MD .  sodium phosphate (FLEET) 7-19 GM/118ML enema 1 enema, 1 enema, Rectal, Once PRN, Vallarie Mare, MD   Physical Exam: AOx3, PERRL, EOMI, FS, Strength 5/5 x4 except pain limited 4+/5 in L hip flexors, SILTx4, incisions c/d/i  Assessment & Plan: 64 y.o. woman s/p anterolateral corpectomy with PSIF for osteomyelitis, recovering well.  -no change in neurosurgical plan of care, okay for DVT chemoprophylaxis as needed, activity as tolerated, brace when out of bed  Judith Part  01/26/20 6:21 PM

## 2020-01-26 NOTE — Progress Notes (Signed)
PROGRESS NOTE    [name] [name]  TIW:[id number] DOB: 11-11-1955 DOA: 01/20/2020 PCP: Ann Held, DO    Brief Narrative:  [phone] with hx DM2, CKD stage 3, hx recent septic elbow with recent L1 compression fracture. Pt presents with worsened back pain, found to have L1 osteo. Neurosurgery and ID consulted  Assessment & Plan:   Principal Problem:   Osteomyelitis of vertebra of thoracolumbar region [facility]) Active Problems:   Essential hypertension   Hepatic cirrhosis (HCC)   CAD (coronary artery disease)   CKD (chronic kidney disease), stage III (HCC)   Ulcer of left heel, limited to breakdown of skin ([place])   Type 2 diabetes mellitus with hyperglycemia, with long-term current use of insulin (HCC)   Septic arthritis of elbow, right (HCC)   Paraspinal mass   Spinal stenosis of lumbar region  Principal Problem:   Osteomyelitis of vertebra of thoracolumbar region [facility]), L1 lumbar discitis with paraspinal fluid enhancement -Currently in significant pain, 8/10, worse on movement -Neurosurgery consulted, pt underwent surgery 3/25 -ID following, pseudomonas growing in tissue cx -Appreciate ID input. Recommendation for Cefepime through 03/05/20 to complete 6 weeks of tx -SW following for SNF placement  Active Problems: Diabetes mellitus type 2, uncontrolled with hyperglycemia -Will continue with BID Lantus dosing with sliding scale coverage -Glucose trends remains stable currently -Hemoglobin A1c 12.5    CKD (chronic kidney disease), stage IIIb (HCC) -Baseline creatinine 1.5-1.6 -Renal function remains stable    Essential hypertension -BP somewhat elevated, placed on Norvasc 5 mg daily -continue with hydralazine IV as tolerated -Stable at this time    Hepatic cirrhosis (HCC) -No acute issues currently    CAD (coronary artery disease) -Currently no chest pain or any anginal symptoms -Stable at this time  DVT prophylaxis: Lovenox subq Code Status: Full Family  Communication: Pt in room, family at bedside Disposition Plan: From home, plan SNF when cleared by neurosurgery and ID with abx regimen finalized  Consultants:   Neurosurgery  ID  Procedures:   L1 corpectomy and reconstruction with expandable cage, T11-L3 percutaneous posterior instrumentation 3/25  Antimicrobials: Anti-infectives (From admission, onward)   Start     Dose/Rate Route Frequency Ordered Stop   01/26/20 2200  ceFEPIme (MAXIPIME) 2 g in sodium chloride 0.9 % 100 mL IVPB     2 g 200 mL/hr over 30 Minutes Intravenous Every 12 hours 01/26/20 1149     01/26/20 1800  ceFEPIme (MAXIPIME) 2 g in sodium chloride 0.9 % 100 mL IVPB  Status:  Discontinued     2 g 200 mL/hr over 30 Minutes Intravenous Every 8 hours 01/26/20 1144 01/26/20 1149   01/24/20 1030  ceFEPIme (MAXIPIME) 2 g in sodium chloride 0.9 % 100 mL IVPB  Status:  Discontinued     2 g 200 mL/hr over 30 Minutes Intravenous Every 12 hours 01/24/20 1012 01/26/20 1144   01/24/20 1000  ceFEPIme (MAXIPIME) 2 g in sodium chloride 0.9 % 100 mL IVPB  Status:  Discontinued     2 g 200 mL/hr over 30 Minutes Intravenous Every 8 hours 01/24/20 0939 01/24/20 1012   01/23/20 2200  vancomycin (VANCOREADY) IVPB 500 mg/100 mL  Status:  Discontinued     500 mg 100 mL/hr over 60 Minutes Intravenous Every 24 hours 01/22/20 2200 01/24/20 1540   01/23/20 1430  rifampin (RIFADIN) capsule 600 mg  Status:  Discontinued     600 mg Oral Daily 01/23/20 1339 01/24/20 1540   01/23/20 1000  Levofloxacin (LEVAQUIN)  IVPB 250 mg  Status:  Discontinued     250 mg 50 mL/hr over 60 Minutes Intravenous Every 24 hours 01/22/20 2141 01/23/20 1339   01/22/20 2300  vancomycin (VANCOCIN) IVPB 1000 mg/200 mL premix     1,000 mg 200 mL/hr over 60 Minutes Intravenous To Post Anesthesia Care Unit 01/22/20 2200 01/23/20 2300   01/22/20 1505  bacitracin 50,000 Units in sodium chloride 0.9 % 500 mL irrigation  Status:  Discontinued       As needed 01/22/20 1505  01/22/20 2020   01/22/20 0800  vancomycin (VANCOCIN) IVPB 1000 mg/200 mL premix     1,000 mg 200 mL/hr over 60 Minutes Intravenous To [facility] 01/22/20 0330 01/22/20 1435      Subjective: Feeling better. Eager to start therapy  Objective: Vitals:   01/25/20 2114 01/26/20 0239 01/26/20 0835 01/26/20 1622  BP: 136/60 (!) 163/68 (!) 159/72 (!) 143/61  Pulse: 95 91 82 86  Resp:   14 15  Temp: 99.1 F (37.3 C) 98.6 F (37 C) 98.2 F (36.8 C) 98.4 F (36.9 C)  TempSrc: Oral Oral Oral Oral  SpO2: 93% 91% 94% 94%  Weight:      Height:        Intake/Output Summary (Last 24 hours) at 01/26/2020 1758 Last data filed at 01/26/2020 1508 Gross per 24 hour  Intake 1063 ml  Output --  Net 1063 ml   Filed Weights   01/20/20 1051 01/20/20 2300  Weight: 81.6 kg 81.7 kg    Examination: General exam: Awake, laying in bed, in nad Respiratory system: Normal respiratory effort, no wheezing Cardiovascular system: regular rate, s1, s2 Gastrointestinal system: Soft, nondistended, positive BS Central nervous system: CN2-12 grossly intact, strength intact Extremities: Perfused, no clubbing Skin: Normal skin turgor, no notable skin lesions seen Psychiatry: Mood normal // no visual hallucinations   Data Reviewed: I have personally reviewed following labs and imaging studies  CBC: Recent Labs  Lab 01/21/20 0619 01/21/20 0619 01/22/20 0534 01/22/20 1503 01/22/20 1822 01/23/20 0323 01/24/20 0253 01/25/20 0435 01/26/20 0551  WBC 7.7   < > 6.5  --   --  10.6* 8.8 7.0 6.3  NEUTROABS 5.2  --   --   --   --   --   --   --   --   HGB 8.8*   < > 8.8*   < > 9.2* 9.2* 7.5* 7.2* 7.8*  HCT 29.1*   < > 29.1*   < > 27.0* 29.7* 25.0* 23.7* 25.1*  MCV 84.6   < > 83.9  --   --  84.9 85.9 86.2 82.6  PLT 211   < > 209  --   --  204 172 159 186   < > = values in this interval not displayed.   Basic Metabolic Panel: Recent Labs  Lab 01/22/20 0534 01/22/20 1503 01/22/20 1822  01/23/20 0323 01/24/20 0253 01/25/20 0435 01/26/20 0551  NA 139   < > 139 136 138 136 138  K 4.5   < > 4.8 4.5 4.9 4.1 3.8  CL 103  --   --  100 107 105 105  CO2 28  --   --  23 24 24 25   GLUCOSE 127*  --   --  302* 186* 154* 93  BUN 19  --   --  22 23 18 14   CREATININE 1.58*  --   --  1.67* 1.65* 1.42* 1.33*  CALCIUM 8.9  --   --  8.6* 8.4* 8.2* 8.6*   < > = values in this interval not displayed.   GFR: Estimated Creatinine Clearance (by C-G formula based on SCr of 1.33 mg/dL (H)) Female: 41.4 mL/min (A) Female: 50.9 mL/min (A) Liver Function Tests: Recent Labs  Lab 01/20/20 1056 01/21/20 0619 01/22/20 0534 01/26/20 0551  AST 9* 10* 10* 13*  ALT 9 8 6 11   ALKPHOS 99 95 90 69  BILITOT 0.8 0.6 0.7 0.7  PROT 7.7 6.9 6.8 6.1*  ALBUMIN 2.3* 2.1* 2.0* 1.7*   Recent Labs  Lab 01/20/20 1056  LIPASE 37   No results for input(s): AMMONIA in the last 168 hours. Coagulation Profile: Recent Labs  Lab 01/22/20 0534  INR 1.2   Cardiac Enzymes: No results for input(s): CKTOTAL, CKMB, CKMBINDEX, TROPONINI in the last 168 hours. BNP (last 3 results) No results for input(s): PROBNP in the last 8760 hours. HbA1C: No results for input(s): HGBA1C in the last 72 hours. CBG: Recent Labs  Lab 01/25/20 1551 01/25/20 2116 01/26/20 0639 01/26/20 1142 01/26/20 1635  GLUCAP 145* 193* 86 115* 139*   Lipid Profile: No results for input(s): CHOL, HDL, LDLCALC, TRIG, CHOLHDL, LDLDIRECT in the last 72 hours. Thyroid Function Tests: No results for input(s): TSH, T4TOTAL, FREET4, T3FREE, THYROIDAB in the last 72 hours. Anemia Panel: No results for input(s): VITAMINB12, FOLATE, FERRITIN, TIBC, IRON, RETICCTPCT in the last 72 hours. Sepsis Labs: No results for input(s): PROCALCITON, LATICACIDVEN in the last 168 hours.  Recent Results (from the past 240 hour(s))  Blood culture (routine x 2)     Status: None   Collection Time: 01/20/20  1:34 PM   Specimen: BLOOD RIGHT HAND  Result  Value Ref Range Status   Specimen Description BLOOD RIGHT HAND  Final   Special Requests   Final    BOTTLES DRAWN AEROBIC ONLY Blood Culture results may not be optimal due to an inadequate volume of blood received in culture bottles   Culture   Final    NO GROWTH 5 DAYS Performed at [facility], [place] [address], [place], [place] [address]    Report Status 01/25/2020 FINAL  Final  Blood culture (routine x 2)     Status: None   Collection Time: 01/20/20  3:28 PM   Specimen: BLOOD  Result Value Ref Range Status   Specimen Description BLOOD RIGHT ANTECUBITAL  Final   Special Requests   Final    BOTTLES DRAWN AEROBIC AND ANAEROBIC Blood Culture adequate volume   Culture   Final    NO GROWTH 5 DAYS Performed at [facility], [address], [place], [place] [address]    Report Status 01/25/2020 FINAL  Final  SARS CORONAVIRUS 2 (TAT 6-24 HRS) Nasopharyngeal Nasopharyngeal Swab     Status: None   Collection Time: 01/20/20  3:37 PM   Specimen: Nasopharyngeal Swab  Result Value Ref Range Status   SARS Coronavirus 2 NEGATIVE NEGATIVE Final    Comment: (NOTE) SARS-CoV-2 target nucleic acids are NOT DETECTED. The SARS-CoV-2 RNA is generally detectable in upper and lower respiratory specimens during the acute phase of infection. Negative results do not preclude SARS-CoV-2 infection, do not rule out co-infections with other pathogens, and should not be used as the sole basis for treatment or other patient management decisions. Negative results must be combined with clinical observations, patient history, and epidemiological information. The expected result is Negative. Fact Sheet for Patients: SugarRoll.be Fact Sheet for Healthcare Providers: https://www.woods-mathews.com/ This test is  not yet approved or cleared by the [place] and  has been authorized for detection and/or diagnosis of SARS-CoV-2 by FDA under an Emergency Use  Authorization (EUA). This EUA will remain  in effect (meaning this test can be used) for the duration of the COVID-19 declaration under Section 56 4(b)(1) of the Act, 21 U.S.C. section 360bbb-3(b)(1), unless the authorization is terminated or revoked sooner. Performed at [facility], [place] [address], [place], [place] [address]   MRSA PCR Screening     Status: None   Collection Time: 01/21/20  5:25 AM   Specimen: Nasal Mucosa; Nasopharyngeal  Result Value Ref Range Status   MRSA by PCR NEGATIVE NEGATIVE Final    Comment:        The GeneXpert MRSA Assay (FDA approved for NASAL specimens only), is one component of a comprehensive MRSA colonization surveillance program. It is not intended to diagnose MRSA infection nor to guide or monitor treatment for MRSA infections. Performed at [facility], [place] [address] [place], [place] [address]   Surgical pcr screen     Status: Abnormal   Collection Time: 01/21/20  3:24 PM   Specimen: Nasal Mucosa; Nasal Swab  Result Value Ref Range Status   MRSA, PCR NEGATIVE NEGATIVE Final   Staphylococcus aureus POSITIVE (A) NEGATIVE Final    Comment: (NOTE) The Xpert SA Assay (FDA approved for NASAL specimens in patients 91 years of age and older), is one component of a comprehensive surveillance program. It is not intended to diagnose infection nor to guide or monitor treatment. Performed at [facility], [place] [address] [place], [place] [address]   Surgical pcr screen     Status: Abnormal   Collection Time: 01/22/20  3:46 AM   Specimen: Nasal Mucosa; Nasal Swab  Result Value Ref Range Status   MRSA, PCR NEGATIVE NEGATIVE Final   Staphylococcus aureus POSITIVE (A) NEGATIVE Final    Comment: CRITICAL RESULT CALLED TO, READ BACK BY AND VERIFIED WITH: RN DAVID Mamie Nick [id number] @0610  THANEY Performed at [facility], [address] [place] [address], [place], [place] [address]   Fungus Culture With Stain     Status: None (Preliminary result)     Collection Time: 01/22/20  3:25 PM   Specimen: PATH Bone biopsy; Tissue  Result Value Ref Range Status   Fungus Stain Final report  Final    Comment: (NOTE) Performed At: [facility] [place], [place] [id number] Rush Farmer MD [id number]    Fungus (Mycology) Culture PENDING  Incomplete   Fungal Source TISSUE  Final    Comment: LUMBAR 1 Performed at [facility], [place] [address], [place], [place] [address]   Aerobic/Anaerobic Culture (surgical/deep wound)     Status: None (Preliminary result)   Collection Time: 01/22/20  3:25 PM   Specimen: PATH Bone biopsy; Tissue  Result Value Ref Range Status   Specimen Description TISSUE  Final   Special Requests LUMBAR 1  Final   Gram Stain   Final    NO WBC SEEN RARE GRAM POSITIVE COCCI Performed at [facility], [address], [place],  [address]    Culture   Final    RARE PSEUDOMONAS AERUGINOSA NO ANAEROBES ISOLATED; CULTURE IN PROGRESS FOR 5 DAYS    Report Status PENDING  Incomplete   Organism ID, Bacteria PSEUDOMONAS AERUGINOSA  Final      Susceptibility   Pseudomonas aeruginosa - MIC*    CEFTAZIDIME  4 SENSITIVE Sensitive     CIPROFLOXACIN <=0.25 SENSITIVE Sensitive     GENTAMICIN <=1 SENSITIVE Sensitive     IMIPENEM 1 SENSITIVE Sensitive     PIP/TAZO 8 SENSITIVE Sensitive     CEFEPIME 2 SENSITIVE Sensitive     * RARE PSEUDOMONAS AERUGINOSA  Acid Fast Smear (AFB)     Status: None   Collection Time: 01/22/20  3:25 PM   Specimen: PATH Bone biopsy; Tissue  Result Value Ref Range Status   AFB Specimen Processing Comment  Final    Comment: Tissue Grinding and Digestion/Decontamination   Acid Fast Smear Negative  Final    Comment: (NOTE) Performed At: [facility] [place], [place] [id number] Rush Farmer MD [id number]    Source (AFB) TISSUE  Final    Comment: LUMBAR 1 Performed at [facility], [place] [address] [place], [place] [address]    Fungus Culture Result     Status: None   Collection Time: 01/22/20  3:25 PM  Result Value Ref Range Status   Result 1 Comment  Final    Comment: (NOTE) KOH/Calcofluor preparation:  no fungus observed. Performed At: [facility] [place], [place] [id number] Rush Farmer MD [id number]   Fungus Culture With Stain     Status: None (Preliminary result)   Collection Time: 01/22/20  5:06 PM   Specimen: PATH Bone biopsy; Tissue  Result Value Ref Range Status   Fungus Stain Final report  Final    Comment: (NOTE) Performed At: [facility] [place], [place] [id number] Rush Farmer MD [id number]    Fungus (Mycology) Culture PENDING  Incomplete   Fungal Source 2  Final    Comment: TISSUE LUMBAR 1 Performed at [facility], [place] [address], [place], [place] [address]   Aerobic/Anaerobic Culture (surgical/deep wound)     Status: None (Preliminary result)   Collection Time: 01/22/20  5:06 PM   Specimen: PATH Bone biopsy; Tissue  Result Value Ref Range Status   Specimen Description TISSUE  Final   Special Requests 2 LUMBAR 1  Final   Gram Stain   Final    NO WBC SEEN NO ORGANISMS SEEN Performed at [facility], [address], [place],  [address]    Culture   Final    RARE PSEUDOMONAS AERUGINOSA CRITICAL RESULT CALLED TO, READ BACK BY AND VERIFIED WITH: RN Lenna Sciara [id number] FCP NO ANAEROBES ISOLATED; CULTURE IN PROGRESS FOR 5 DAYS    Report Status PENDING  Incomplete   Organism ID, Bacteria PSEUDOMONAS AERUGINOSA  Final      Susceptibility   Pseudomonas aeruginosa - MIC*    CEFTAZIDIME 4 SENSITIVE Sensitive     CIPROFLOXACIN 0.5 SENSITIVE Sensitive     GENTAMICIN <=1 SENSITIVE Sensitive     IMIPENEM 1 SENSITIVE Sensitive     PIP/TAZO 8 SENSITIVE Sensitive     CEFEPIME 2 SENSITIVE Sensitive     * RARE PSEUDOMONAS AERUGINOSA  Acid Fast Smear (AFB)     Status: None   Collection Time: 01/22/20  5:06 PM    Specimen: PATH Bone biopsy; Tissue  Result Value Ref Range Status   AFB Specimen Processing Comment  Final    Comment: Tissue Grinding and Digestion/Decontamination   Acid Fast Smear Negative  Final    Comment: (NOTE) Performed At: [facility] [place], [place] [id number] Rush Farmer MD [id number]    Source (AFB) 2  Final  Comment: TISSUE LUMBAR 1 Performed at [facility], [place] [address], [place], [place] [address]   Fungus Culture Result     Status: None   Collection Time: 01/22/20  5:06 PM  Result Value Ref Range Status   Result 1 Comment  Final    Comment: (NOTE) KOH/Calcofluor preparation:  no fungus observed. Performed At: [facility] [place], [place] [id number] Rush Farmer MD [id number]      Radiology Studies: [name] EKG SITE RITE  Result Date: 01/25/2020 If Site Rite image not attached, placement could not be confirmed due to current cardiac rhythm.   Scheduled Meds: . amLODipine  5 mg Oral Daily  . atorvastatin  10 mg Oral Daily  . Chlorhexidine Gluconate Cloth  6 each Topical Daily  . docusate sodium  100 mg Oral BID  . DULoxetine  60 mg Oral Daily  . enoxaparin (LOVENOX) injection  40 mg Subcutaneous Q24H  . feeding supplement (GLUCERNA SHAKE)  237 mL Oral TID BM  . insulin aspart  0-9 Units Subcutaneous TID WC  . insulin glargine  20 Units Subcutaneous Daily  . insulin glargine  30 Units Subcutaneous Q2200  . lidocaine  1 patch Transdermal Q24H  . multivitamin with minerals  1 tablet Oral Daily  . pregabalin  150 mg Oral BID  . senna  1 tablet Oral BID  . sodium chloride flush  3 mL Intravenous Q12H   Continuous Infusions: . sodium chloride Stopped (01/22/20 2326)  . 0.9 % NaCl with KCl 20 mEq / L Stopped (01/24/20 2016)  . ceFEPime (MAXIPIME) IV       LOS: 6 days   Marylu Lund, MD [facility] Pager On Amion  If 7PM-7AM, please contact night-coverage 01/26/2020, 5:58 PM

## 2020-01-26 NOTE — Progress Notes (Signed)
Physical Therapy Treatment Patient Details Name: [name] [name] MRN: [id number] DOB: 11/27/1955 Today's Date: 01/26/2020    History of Present Illness [name] [name] is a 64 y.o. adult with PMH signficant for diabetes mellitus type 2, chronic kidney disease stage III,  anemia, HTN, anxiety, GERD, fibromyalgia, depression, PTSD, and is legally blind. She was admitted for L1 osteomyelitis and spinal instability. S/p debridement and stabilization T11-12 & L2-3 (3/25)     PT Comments    Continuing work on functional mobility and activity tolerance;  Session focused on transfers and initial gait training with RW; After ensuring good brace fit, [name] [name] was able to stand to her RW and take a few steps bed to recliner;   I noted in an earlier [facility] note that [name] [name] is preferring to dc home with [facility] services; at her current functional level, she will need 24 hour assist; I continue to recommend post-acute rehab for her to maximize independence and safety with mobility and ADLs prior to dc home -- I'm wondering if she would be more amenable to CIR for rehab (which to a pt can feel more like a simple transfer of rooms); Must consider that she doesn't have 24 hour assist at home, but with intensive therapies at CIR, it is likely she will reach a modified independent functional level to be able to safely dc home -- I have updated the rec to CIR to have Rehab Admissions Coord weigh in.  Follow Up Recommendations  CIR(Still needs post-acute rehab; see above)     Equipment Recommendations  Rolling walker with 5" wheels;3in1 (PT)    Recommendations for Other Services       Precautions / Restrictions Precautions Precautions: Back;Fall Precaution Booklet Issued: Yes (comment) Precaution Comments: Pt instructed in back precautions  Required Braces or Orthoses: Spinal Brace Spinal Brace: Thoracolumbosacral orthotic;Applied in supine position Restrictions Other Position/Activity Restrictions: TLSO  donned in supine, must be worn when HOB >30deg    Mobility  Bed Mobility Overal bed mobility: Needs Assistance Bed Mobility: Rolling;Sidelying to Sit Rolling: Min assist;+2 for safety/equipment Sidelying to sit: Mod assist;+2 for physical assistance       General bed mobility comments: Pt able to assist with rolling. Mod A +2 for rolling to maintain precautions and donning of TLSO in supine. Min A to sit EOB- assist to get trunk upright and scoot anteriorly. Pt requires inc time and explanation of planned movements  Transfers Overall transfer level: Needs assistance Equipment used: Rolling walker (2 wheeled) Transfers: Sit to/from Stand Sit to Stand: Min assist;+2 safety/equipment         General transfer comment: Cues for hand placement and safety and min assist to steady; Good rise form slightly elevated bed  Ambulation/Gait Ambulation/Gait assistance: Min assist;+2 safety/equipment Gait Distance (Feet): 5 Feet Assistive device: Rolling walker (2 wheeled) Gait Pattern/deviations: Shuffle     General Gait Details: A few sidesteps at EOB and turning steps to recliner with RW; cues for directions die to decr vision   Stairs             Wheelchair Mobility    Modified Rankin (Stroke Patients Only)       Balance     Sitting balance-Leahy Scale: Fair       Standing balance-Leahy Scale: Poor                              Cognition Arousal/Alertness: Awake/alert Behavior During Therapy:  Anxious;WFL for tasks assessed/performed Overall Cognitive Status: Within Functional Limits for tasks assessed                                        Exercises      General Comments General comments (skin integrity, edema, etc.): Took extra time to ensure good fit of brace      Pertinent Vitals/Pain Pain Assessment: Faces Pain Score: 10-Worst pain ever Faces Pain Scale: Hurts even more Pain Location: lumbar region Pain Descriptors /  Indicators: Aching;Discomfort;Operative site guarding;Sore Pain Intervention(s): Monitored during session;Premedicated before session    Home Living                      Prior Function            PT Goals (current goals can now be found in the care plan section) Acute Rehab PT Goals Patient Stated Goal: to have less pain  PT Goal Formulation: With patient Time For Goal Achievement: 02/06/20 Potential to Achieve Goals: Fair Progress towards PT goals: Progressing toward goals    Frequency    Min 5X/week      PT Plan Discharge plan needs to be updated    Co-evaluation              AM-PAC PT "6 Clicks" Mobility   Outcome Measure  Help needed turning from your back to your side while in a flat bed without using bedrails?: A Little Help needed moving from lying on your back to sitting on the side of a flat bed without using bedrails?: A Lot Help needed moving to and from a bed to a chair (including a wheelchair)?: A Little Help needed standing up from a chair using your arms (e.g., wheelchair or bedside chair)?: A Little Help needed to walk in hospital room?: A Lot Help needed climbing 3-5 steps with a railing? : Total 6 Click Score: 14    End of Session Equipment Utilized During Treatment: Gait belt;Back brace Activity Tolerance: Patient tolerated treatment well Patient left: in chair;with call bell/phone within reach;with chair alarm set Nurse Communication: Mobility status PT Visit Diagnosis: Muscle weakness (generalized) (M62.81);Other abnormalities of gait and mobility (R26.89);Pain Pain - Right/Left: (Lumbar) Pain - part of body: (Back)     Time: 1000-1034 PT Time Calculation (min) (ACUTE ONLY): 34 min  Charges:  $Gait Training: 8-22 mins $Therapeutic Activity: 8-22 mins                     Roney Marion, PT  Acute Rehabilitation Services Pager [phone] Office [place] 01/26/2020, 3:43 PM

## 2020-01-27 ENCOUNTER — Encounter: Payer: Self-pay | Admitting: *Deleted

## 2020-01-27 LAB — AEROBIC/ANAEROBIC CULTURE W GRAM STAIN (SURGICAL/DEEP WOUND)
Gram Stain: NONE SEEN
Gram Stain: NONE SEEN
Special Requests: 2

## 2020-01-27 LAB — COMPREHENSIVE METABOLIC PANEL
ALT: 9 U/L (ref 0–44)
AST: 12 U/L — ABNORMAL LOW (ref 15–41)
Albumin: 1.6 g/dL — ABNORMAL LOW (ref 3.5–5.0)
Alkaline Phosphatase: 68 U/L (ref 38–126)
Anion gap: 9 (ref 5–15)
BUN: 14 mg/dL (ref 8–23)
CO2: 26 mmol/L (ref 22–32)
Calcium: 8.5 mg/dL — ABNORMAL LOW (ref 8.9–10.3)
Chloride: 103 mmol/L (ref 98–111)
Creatinine, Ser: 1.29 mg/dL — ABNORMAL HIGH (ref 0.44–1.00)
GFR calc Af Amer: 51 mL/min — ABNORMAL LOW (ref >60)
GFR calc non Af Amer: 44 mL/min — ABNORMAL LOW (ref >60)
Glucose, Bld: 208 mg/dL — ABNORMAL HIGH (ref 70–99)
Potassium: 3.7 mmol/L (ref 3.5–5.1)
Sodium: 138 mmol/L (ref 135–145)
Total Bilirubin: 0.6 mg/dL (ref 0.3–1.2)
Total Protein: 5.9 g/dL — ABNORMAL LOW (ref 6.5–8.1)

## 2020-01-27 LAB — CBC
HCT: 24.3 % — ABNORMAL LOW (ref 36.0–46.0)
Hemoglobin: 7.4 g/dL — ABNORMAL LOW (ref 12.0–15.0)
MCH: 25.9 pg — ABNORMAL LOW (ref 26.0–34.0)
MCHC: 30.5 g/dL (ref 30.0–36.0)
MCV: 85 fL (ref 80.0–100.0)
Platelets: 191 10*3/uL (ref 150–400)
RBC: 2.86 MIL/uL — ABNORMAL LOW (ref 3.87–5.11)
RDW: 15.1 % (ref 11.5–15.5)
WBC: 5.8 10*3/uL (ref 4.0–10.5)
nRBC: 0 % (ref 0.0–0.2)

## 2020-01-27 LAB — GLUCOSE, CAPILLARY
Glucose-Capillary: 170 mg/dL — ABNORMAL HIGH (ref 70–99)
Glucose-Capillary: 128 mg/dL — ABNORMAL HIGH (ref 70–99)
Glucose-Capillary: 118 mg/dL — ABNORMAL HIGH (ref 70–99)
Glucose-Capillary: 147 mg/dL — ABNORMAL HIGH (ref 70–99)

## 2020-01-27 NOTE — Progress Notes (Signed)
Inpatient Rehab Admissions:  Inpatient Rehab Consult received.  I met with pt at the bedside for rehabilitation assessment. Pt supine in bed, pleasant, and willing to discuss rehab options. AC discussed recommended IP Rehab program, including expectations, anticipated LOS, and expected functional outcomes. Per pt, she will be home alone for about 5 hours a day, but anticipates she will be able to have her husband or neighbor visit her at least one time during that time period to assist with toileting as needed. Anticipate pt can reach a Mod I/Supervision level through CIR stay. Pt would like to speak with her husband this afternoon before committing to IP Rehab program. She did give permission for [facility] to speak to her husband and to begin insurance authorization process for possible admit. AC will follow up with pt tomorrow for final decision.   Raechel Ache, OTR/L  Rehab Admissions Coordinator  [phone] 01/27/2020 3:42 PM

## 2020-01-27 NOTE — Progress Notes (Signed)
Patient refused CPAP for tonight. RT instructed patient to have RT called if she changes her mind. RT will monitor as needed. ?

## 2020-01-27 NOTE — Progress Notes (Signed)
Physical Therapy Treatment Patient Details Name: [name] [name] MRN: [id number] DOB: [birth year] Today's Date: 01/27/2020    History of Present Illness [name] [name] is a 64 y.o. adult with PMH signficant for diabetes mellitus type 2, chronic kidney disease stage III,  anemia, HTN, anxiety, GERD, fibromyalgia, depression, PTSD, and is legally blind. She was admitted for L1 osteomyelitis and spinal instability. S/p debridement and stabilization T11-12 & L2-3 (3/25)     PT Comments    Continuing work on functional mobility and activity tolerance;   [name] [name] expressed a desire to get home instead of to SNF; She was able to teach back an overview of positioning for donning her back brace correctly; Once up to EOB, she described feeling dizzy; we took things slowly, with lots of cues to self-monitor for activity tolerance; Obtained serial BPs (see general comments below and doc flowsheets) -- notable for a significant SBP drop in conjunction with standing activities, and she was symptomatic for nausea and dizziness; Notified Nursing;   Took time during session to discuss her ultimate goal of getting home to her familiar routine and environment; Recommend post-acute rehab, and she sounded amenable to going to the Inpt Rehab floor here at [facility] for more therapies to maximize independence and safety with mobility prior to getting home  Follow Up Recommendations  CIR(See discussion above)     Equipment Recommendations  Rolling walker with 5" wheels;3in1 (PT)    Recommendations for Other Services       Precautions / Restrictions Precautions Precautions: Back;Fall;Other (comment)(Orthostatic hypotension) Precaution Booklet Issued: Yes (comment) Precaution Comments: Pt instructed in back precautions  Required Braces or Orthoses: Spinal Brace Spinal Brace: Thoracolumbosacral orthotic;Applied in supine position Restrictions Other Position/Activity Restrictions: TLSO donned in supine, must be  worn when HOB >30deg    Mobility  Bed Mobility Overal bed mobility: Needs Assistance Bed Mobility: Rolling;Sidelying to Sit Rolling: Min assist;+2 for safety/equipment Sidelying to sit: Mod assist;+2 for physical assistance;+2 for safety/equipment       General bed mobility comments: Pt able to assist with rolling. Mod A +2 for rolling to maintain precautions and donning of TLSO in supine. Min A to sit EOB- assist to get trunk upright and scoot anteriorly. Pt requires inc time and explanation of planned movements; she was able to describe an overview of how to don her brace to OT  Transfers Overall transfer level: Needs assistance Equipment used: Rolling walker (2 wheeled) Transfers: Sit to/from Stand Sit to Stand: Min assist;+2 safety/equipment         General transfer comment: Cues for hand placement and safety and min assist to steady; Good rise form slightly elevated bed; cues to self-monitor for activity tolerance  Ambulation/Gait Ambulation/Gait assistance: Min assist;+2 safety/equipment Gait Distance (Feet): (Pivotal steps bed to chair) Assistive device: Rolling walker (2 wheeled) Gait Pattern/deviations: Shuffle     General Gait Details: Did not push progressive ambulation due to dizziness with upright activity; limited step to just in front of recliner while we took standing BP, which showed quite a drop, so sat down straightaway   Stairs             Wheelchair Mobility    Modified Rankin (Stroke Patients Only)       Balance     Sitting balance-Leahy Scale: Fair       Standing balance-Leahy Scale: Poor  Cognition Arousal/Alertness: Awake/alert Behavior During Therapy: Anxious;WFL for tasks assessed/performed Overall Cognitive Status: Within Functional Limits for tasks assessed                                        Exercises      General Comments General comments (skin integrity,  edema, etc.):   01/27/20 0945 01/27/20 0948 01/27/20 0950  Vital Signs  Patient Position (if appropriate) Orthostatic Vitals  --   --   Orthostatic Lying   BP- Lying  --   --   --   Pulse- Lying  --   --   --   Orthostatic Sitting  BP- Sitting 110/60 (!) 80/61 130/65 (Reclined near supine)  Pulse- Sitting  --  87 81  Orthostatic Standing at 0 minutes  BP- Standing at 0 minutes (!) 82/47  --   --   Pulse- Standing at 0 minutes 117  --   --     01/27/20 1000 01/27/20 1001 01/27/20 1003  Vital Signs  Patient Position (if appropriate)  --   --   --   Orthostatic Lying   BP- Lying  --  109/67 138/68  Pulse- Lying  --  80 72  Orthostatic Sitting  BP- Sitting 100/65  --   --   Pulse- Sitting 81  --   --   Orthostatic Standing at 0 minutes  BP- Standing at 0 minutes  --   --   --   Pulse- Standing at 0 minutes  --   --   --          Pertinent Vitals/Pain Pain Assessment: 0-10 Pain Score: 9  Faces Pain Scale: Hurts even more Pain Location: lumbar region Pain Descriptors / Indicators: Aching;Discomfort;Operative site guarding;Sore Pain Intervention(s): Monitored during session;Premedicated before session    Home Living                      Prior Function            PT Goals (current goals can now be found in the care plan section) Acute Rehab PT Goals PT Goal Formulation: With patient Time For Goal Achievement: 02/06/20 Potential to Achieve Goals: Fair Progress towards PT goals: Progressing toward goals(though Orthostatic this session)    Frequency    Min 5X/week      PT Plan Current plan remains appropriate    Co-evaluation PT/OT/SLP Co-Evaluation/Treatment: Yes Reason for Co-Treatment: For patient/therapist safety;To address functional/ADL transfers PT goals addressed during session: Mobility/safety with mobility        AM-PAC PT "6 Clicks" Mobility   Outcome Measure  Help needed turning from your back to your side while in a flat bed without  using bedrails?: A Little Help needed moving from lying on your back to sitting on the side of a flat bed without using bedrails?: A Lot Help needed moving to and from a bed to a chair (including a wheelchair)?: A Little Help needed standing up from a chair using your arms (e.g., wheelchair or bedside chair)?: A Little Help needed to walk in hospital room?: A Lot Help needed climbing 3-5 steps with a railing? : Total 6 Click Score: 14    End of Session Equipment Utilized During Treatment: Back brace Activity Tolerance: Other (comment)(limited by orthostatic hypotension) Patient left: in chair;with call bell/phone within reach;Other (comment)(with OT ) Nurse Communication: Mobility status;Other (comment)(BP  drop in standing, requesting TED hose) PT Visit Diagnosis: Muscle weakness (generalized) (M62.81);Other abnormalities of gait and mobility (R26.89);Pain Pain - Right/Left: (low back) Pain - part of body: (low back)     Time: 7741-2878 PT Time Calculation (min) (ACUTE ONLY): 39 min  Charges:  $Therapeutic Activity: 8-22 mins                     Roney Marion, PT  Acute Rehabilitation Services Pager [phone] Office [phone]    [place] 01/27/2020, 12:19 PM

## 2020-01-27 NOTE — Plan of Care (Signed)
  Problem: Education: Goal: Knowledge of General Education information will improve Description: Including pain rating scale, medication(s)/side effects and non-pharmacologic comfort measures Outcome: Progressing   Problem: Health Behavior/Discharge Planning: Goal: Ability to manage health-related needs will improve Outcome: Progressing   Problem: Clinical Measurements: Goal: Will remain free from infection Outcome: Progressing Goal: Respiratory complications will improve Outcome: Progressing   Problem: Activity: Goal: Risk for activity intolerance will decrease Outcome: Progressing   Problem: Coping: Goal: Level of anxiety will decrease Outcome: Progressing   Problem: Elimination: Goal: Will not experience complications related to bowel motility Outcome: Progressing   Problem: Pain Managment: Goal: General experience of comfort will improve Outcome: Progressing   Problem: Safety: Goal: Ability to remain free from injury will improve Outcome: Progressing   Problem: Skin Integrity: Goal: Risk for impaired skin integrity will decrease Outcome: Progressing

## 2020-01-27 NOTE — Progress Notes (Signed)
PROGRESS NOTE    [name] [name]  ERD:[id number] DOB: [birth year] DOA: 01/20/2020 PCP: Ann Held, DO    Brief Narrative:  [phone] with hx DM2, CKD stage 3, hx recent septic elbow with recent L1 compression fracture. Pt presents with worsened back pain, found to have L1 osteo. Neurosurgery and ID consulted  Assessment & Plan:   Principal Problem:   Osteomyelitis of vertebra of thoracolumbar region [facility]) Active Problems:   Essential hypertension   Hepatic cirrhosis (HCC)   CAD (coronary artery disease)   CKD (chronic kidney disease), stage III (HCC)   Ulcer of left heel, limited to breakdown of skin ([place])   Type 2 diabetes mellitus with hyperglycemia, with long-term current use of insulin (HCC)   Septic arthritis of elbow, right (HCC)   Paraspinal mass   Spinal stenosis of lumbar region  Principal Problem:   Osteomyelitis of vertebra of thoracolumbar region [name]), L1 lumbar discitis with paraspinal fluid enhancement -Currently in significant pain, 8/10, worse on movement -Neurosurgery consulted, pt underwent surgery 3/25 -ID following, pseudomonas growing in tissue cx -Appreciate ID input. Recommendation for Cefepime through 03/05/20 to complete 6 weeks of tx -Plan for SNF vs CIR  Active Problems: Diabetes mellitus type 2, uncontrolled with hyperglycemia -Will continue with BID Lantus dosing with sliding scale coverage -Glucose trends remains stable at present -Hemoglobin A1c 12.5    CKD (chronic kidney disease), stage IIIb (HCC) -Baseline creatinine 1.5-1.6 -Renal function remains stable    Essential hypertension -BP somewhat elevated, placed on Norvasc 5 mg daily -continue with hydralazine IV as tolerated -Remains stable    Hepatic cirrhosis (HCC) -No acute issues at present    CAD (coronary artery disease) -Currently no chest pain or any anginal symptoms -Remains stable at this time  Possible OSA -Patient describes heavy snoring and observed  apnea while sleeping prior to admit -Reports some subjective sob upon awakening this AM -Will give trial of CPAP at night while in hospital -Recommend outpatient sleep study when ultimately discharged  DVT prophylaxis: Lovenox subq Code Status: Full Family Communication: Pt in room, family at bedside Disposition Plan: From home, plan SNF vs CIR when bed available   Consultants:   Neurosurgery  ID  Procedures:   L1 corpectomy and reconstruction with expandable cage, T11-L3 percutaneous posterior instrumentation 3/25  Antimicrobials: Anti-infectives (From admission, onward)   Start     Dose/Rate Route Frequency Ordered Stop   01/26/20 2200  ceFEPIme (MAXIPIME) 2 g in sodium chloride 0.9 % 100 mL IVPB     2 g 200 mL/hr over 30 Minutes Intravenous Every 12 hours 01/26/20 1149     01/26/20 1800  ceFEPIme (MAXIPIME) 2 g in sodium chloride 0.9 % 100 mL IVPB  Status:  Discontinued     2 g 200 mL/hr over 30 Minutes Intravenous Every 8 hours 01/26/20 1144 01/26/20 1149   01/24/20 1030  ceFEPIme (MAXIPIME) 2 g in sodium chloride 0.9 % 100 mL IVPB  Status:  Discontinued     2 g 200 mL/hr over 30 Minutes Intravenous Every 12 hours 01/24/20 1012 01/26/20 1144   01/24/20 1000  ceFEPIme (MAXIPIME) 2 g in sodium chloride 0.9 % 100 mL IVPB  Status:  Discontinued     2 g 200 mL/hr over 30 Minutes Intravenous Every 8 hours 01/24/20 0939 01/24/20 1012   01/23/20 2200  vancomycin (VANCOREADY) IVPB 500 mg/100 mL  Status:  Discontinued     500 mg 100 mL/hr over 60 Minutes Intravenous Every 24 hours  01/22/20 2200 01/24/20 1540   01/23/20 1430  rifampin (RIFADIN) capsule 600 mg  Status:  Discontinued     600 mg Oral Daily 01/23/20 1339 01/24/20 1540   01/23/20 1000  Levofloxacin (LEVAQUIN) IVPB 250 mg  Status:  Discontinued     250 mg 50 mL/hr over 60 Minutes Intravenous Every 24 hours 01/22/20 2141 01/23/20 1339   01/22/20 2300  vancomycin (VANCOCIN) IVPB 1000 mg/200 mL premix     1,000 mg 200  mL/hr over 60 Minutes Intravenous To Post Anesthesia Care Unit 01/22/20 2200 01/23/20 2300   01/22/20 1505  bacitracin 50,000 Units in sodium chloride 0.9 % 500 mL irrigation  Status:  Discontinued       As needed 01/22/20 1505 01/22/20 2020   01/22/20 0800  vancomycin (VANCOCIN) IVPB 1000 mg/200 mL premix     1,000 mg 200 mL/hr over 60 Minutes Intravenous To ShortStay Surgical 01/22/20 0330 01/22/20 1435      Subjective: Reports feeling better.  Objective: Vitals:   01/27/20 0324 01/27/20 0824 01/27/20 0826 01/27/20 1424  BP: (!) 144/66 (!) 157/67 (!) 157/67 (!) 126/49  Pulse: 76  75 82  Resp: 17  14 15   Temp: 97.7 F (36.5 C)  98.4 F (36.9 C) (!) 97.4 F (36.3 C)  TempSrc: Oral  Oral Oral  SpO2: 94%  97% 96%  Weight:      Height:        Intake/Output Summary (Last 24 hours) at 01/27/2020 1705 Last data filed at 01/27/2020 1505 Gross per 24 hour  Intake 1283 ml  Output --  Net 1283 ml   Filed Weights   01/20/20 1051 01/20/20 2300  Weight: 81.6 kg 81.7 kg    Examination: General exam: Conversant, in no acute distress Respiratory system: normal chest rise, clear, no audible wheezing Cardiovascular system: regular rhythm, s1-s2 Gastrointestinal system: Nondistended, nontender, pos BS Central nervous system: No seizures, no tremors Extremities: No cyanosis, no joint deformities Skin: No rashes, no pallor Psychiatry: Affect normal // no auditory hallucinations    Data Reviewed: I have personally reviewed following labs and imaging studies  CBC: Recent Labs  Lab 01/21/20 0619 01/22/20 0534 01/23/20 0323 01/24/20 0253 01/25/20 0435 01/26/20 0551 01/27/20 0434  WBC 7.7   < > 10.6* 8.8 7.0 6.3 5.8  NEUTROABS 5.2  --   --   --   --   --   --   HGB 8.8*   < > 9.2* 7.5* 7.2* 7.8* 7.4*  HCT 29.1*   < > 29.7* 25.0* 23.7* 25.1* 24.3*  MCV 84.6   < > 84.9 85.9 86.2 82.6 85.0  PLT 211   < > 204 172 159 186 191   < > = values in this interval not displayed.    Basic Metabolic Panel: Recent Labs  Lab 01/23/20 0323 01/24/20 0253 01/25/20 0435 01/26/20 0551 01/27/20 0434  NA 136 138 136 138 138  K 4.5 4.9 4.1 3.8 3.7  CL 100 107 105 105 103  CO2 23 24 24 25 26   GLUCOSE 302* 186* 154* 93 208*  BUN 22 23 18 14 14   CREATININE 1.67* 1.65* 1.42* 1.33* 1.29*  CALCIUM 8.6* 8.4* 8.2* 8.6* 8.5*   GFR: Estimated Creatinine Clearance (by C-G formula based on SCr of 1.29 mg/dL (H)) Female: 42.7 mL/min (A) Female: 52.5 mL/min (A) Liver Function Tests: Recent Labs  Lab 01/21/20 0619 01/22/20 0534 01/26/20 0551 01/27/20 0434  AST 10* 10* 13* 12*  ALT 8 6 11  9  ALKPHOS 95 90 69 68  BILITOT 0.6 0.7 0.7 0.6  PROT 6.9 6.8 6.1* 5.9*  ALBUMIN 2.1* 2.0* 1.7* 1.6*   No results for input(s): LIPASE, AMYLASE in the last 168 hours. No results for input(s): AMMONIA in the last 168 hours. Coagulation Profile: Recent Labs  Lab 01/22/20 0534  INR 1.2   Cardiac Enzymes: No results for input(s): CKTOTAL, CKMB, CKMBINDEX, TROPONINI in the last 168 hours. BNP (last 3 results) No results for input(s): PROBNP in the last 8760 hours. HbA1C: No results for input(s): HGBA1C in the last 72 hours. CBG: Recent Labs  Lab 01/26/20 1635 01/26/20 2002 01/27/20 0640 01/27/20 1131 01/27/20 1646  GLUCAP 139* 216* 170* 128* 118*   Lipid Profile: No results for input(s): CHOL, HDL, LDLCALC, TRIG, CHOLHDL, LDLDIRECT in the last 72 hours. Thyroid Function Tests: No results for input(s): TSH, T4TOTAL, FREET4, T3FREE, THYROIDAB in the last 72 hours. Anemia Panel: No results for input(s): VITAMINB12, FOLATE, FERRITIN, TIBC, IRON, RETICCTPCT in the last 72 hours. Sepsis Labs: No results for input(s): PROCALCITON, LATICACIDVEN in the last 168 hours.  Recent Results (from the past 240 hour(s))  Blood culture (routine x 2)     Status: None   Collection Time: 01/20/20  1:34 PM   Specimen: BLOOD RIGHT HAND  Result Value Ref Range Status   Specimen Description  BLOOD RIGHT HAND  Final   Special Requests   Final    BOTTLES DRAWN AEROBIC ONLY Blood Culture results may not be optimal due to an inadequate volume of blood received in culture bottles   Culture   Final    NO GROWTH 5 DAYS Performed at [facility], [place] [address], [name], [place] [address]    Report Status 01/25/2020 FINAL  Final  Blood culture (routine x 2)     Status: None   Collection Time: 01/20/20  3:28 PM   Specimen: BLOOD  Result Value Ref Range Status   Specimen Description BLOOD RIGHT ANTECUBITAL  Final   Special Requests   Final    BOTTLES DRAWN AEROBIC AND ANAEROBIC Blood Culture adequate volume   Culture   Final    NO GROWTH 5 DAYS Performed at [facility], [address] [place], [place] [address]    Report Status 01/25/2020 FINAL  Final  SARS CORONAVIRUS 2 (TAT 6-24 HRS) Nasopharyngeal Nasopharyngeal Swab     Status: None   Collection Time: 01/20/20  3:37 PM   Specimen: Nasopharyngeal Swab  Result Value Ref Range Status   SARS Coronavirus 2 NEGATIVE NEGATIVE Final    Comment: (NOTE) SARS-CoV-2 target nucleic acids are NOT DETECTED. The SARS-CoV-2 RNA is generally detectable in upper and lower respiratory specimens during the acute phase of infection. Negative results do not preclude SARS-CoV-2 infection, do not rule out co-infections with other pathogens, and should not be used as the sole basis for treatment or other patient management decisions. Negative results must be combined with clinical observations, patient history, and epidemiological information. The expected result is Negative. Fact Sheet for Patients: SugarRoll.be Fact Sheet for Healthcare Providers: https://www.woods-mathews.com/ This test is not yet approved or cleared by the [place] FDA and  has been authorized for detection and/or diagnosis of SARS-CoV-2 by FDA under an Emergency Use Authorization (EUA). This EUA will remain  in  effect (meaning this test can be used) for the duration of the COVID-19 declaration under Section 56 4(b)(1) of the Act, 21 U.S.C. section 360bbb-3(b)(1), unless the authorization is  terminated or revoked sooner. Performed at [facility], [place] [address], [place], [place] [address]   MRSA PCR Screening     Status: None   Collection Time: 01/21/20  5:25 AM   Specimen: Nasal Mucosa; Nasopharyngeal  Result Value Ref Range Status   MRSA by PCR NEGATIVE NEGATIVE Final    Comment:        The GeneXpert MRSA Assay (FDA approved for NASAL specimens only), is one component of a comprehensive MRSA colonization surveillance program. It is not intended to diagnose MRSA infection nor to guide or monitor treatment for MRSA infections. Performed at [facility], [place] [address], [place], [place] [address]   Surgical pcr screen     Status: Abnormal   Collection Time: 01/21/20  3:24 PM   Specimen: Nasal Mucosa; Nasal Swab  Result Value Ref Range Status   MRSA, PCR NEGATIVE NEGATIVE Final   Staphylococcus aureus POSITIVE (A) NEGATIVE Final    Comment: (NOTE) The Xpert SA Assay (FDA approved for NASAL specimens in patients 46 years of age and older), is one component of a comprehensive surveillance program. It is not intended to diagnose infection nor to guide or monitor treatment. Performed at [facility], [place] [address], [place], [place] [address]   Surgical pcr screen     Status: Abnormal   Collection Time: 01/22/20  3:46 AM   Specimen: Nasal Mucosa; Nasal Swab  Result Value Ref Range Status   MRSA, PCR NEGATIVE NEGATIVE Final   Staphylococcus aureus POSITIVE (A) NEGATIVE Final    Comment: CRITICAL RESULT CALLED TO, READ BACK BY AND VERIFIED WITH: RN DAVID Mamie Nick [id number] @0610  THANEY Performed at [facility], [place] [address] [place], [place] [address]   Fungus Culture With Stain     Status: None (Preliminary result)   Collection Time: 01/22/20  3:25 PM    Specimen: PATH Bone biopsy; Tissue  Result Value Ref Range Status   Fungus Stain Final report  Final    Comment: (NOTE) Performed At: [facility] [place], [place] [id number] Rush Farmer MD OI:[id number]    Fungus (Mycology) Culture PENDING  Incomplete   Fungal Source TISSUE  Final    Comment: LUMBAR 1 Performed at [facility], [place] [address] [place], [place] [address]   Aerobic/Anaerobic Culture (surgical/deep wound)     Status: None   Collection Time: 01/22/20  3:25 PM   Specimen: PATH Bone biopsy; Tissue  Result Value Ref Range Status   Specimen Description TISSUE  Final   Special Requests LUMBAR 1  Final   Gram Stain NO WBC SEEN RARE GRAM POSITIVE COCCI   Final   Culture   Final    RARE PSEUDOMONAS AERUGINOSA NO ANAEROBES ISOLATED Performed at [facility], [address], [place], [place] [address]    Report Status 01/27/2020 FINAL  Final   Organism ID, Bacteria PSEUDOMONAS AERUGINOSA  Final      Susceptibility   Pseudomonas aeruginosa - MIC*    CEFTAZIDIME 4 SENSITIVE Sensitive     CIPROFLOXACIN <=0.25 SENSITIVE Sensitive     GENTAMICIN <=1 SENSITIVE Sensitive     IMIPENEM 1 SENSITIVE Sensitive     PIP/TAZO 8 SENSITIVE Sensitive     CEFEPIME 2 SENSITIVE Sensitive     * RARE PSEUDOMONAS AERUGINOSA  Acid Fast Smear (AFB)     Status: None   Collection Time: 01/22/20  3:25 PM   Specimen: PATH Bone biopsy; Tissue  Result Value Ref Range Status   AFB Specimen Processing Comment  Final    Comment: Tissue Grinding and Digestion/Decontamination   Acid Fast Smear Negative  Final    Comment: (NOTE) Performed At: [facility] [place], [place] [id number] Rush Farmer MD SJ:[id number]    Source (AFB) TISSUE  Final    Comment: LUMBAR 1 Performed at [facility], [place] [address], [place], [place] [address]   Fungus Culture Result     Status: None   Collection Time: 01/22/20  3:25 PM  Result Value Ref  Range Status   Result 1 Comment  Final    Comment: (NOTE) KOH/Calcofluor preparation:  no fungus observed. Performed At: [facility] [place], [place] [id number] Rush Farmer MD EX:[id number]   Fungus Culture With Stain     Status: None (Preliminary result)   Collection Time: 01/22/20  5:06 PM   Specimen: PATH Bone biopsy; Tissue  Result Value Ref Range Status   Fungus Stain Final report  Final    Comment: (NOTE) Performed At: [facility] [place], [place] [id number] Rush Farmer MD WG:[id number]    Fungus (Mycology) Culture PENDING  Incomplete   Fungal Source 2  Final    Comment: TISSUE LUMBAR 1 Performed at [facility], [place] [address] [place], [place] [address]   Aerobic/Anaerobic Culture (surgical/deep wound)     Status: None   Collection Time: 01/22/20  5:06 PM   Specimen: PATH Bone biopsy; Tissue  Result Value Ref Range Status   Specimen Description TISSUE  Final   Special Requests 2 LUMBAR 1  Final   Gram Stain NO WBC SEEN NO ORGANISMS SEEN   Final   Culture   Final    RARE PSEUDOMONAS AERUGINOSA CRITICAL RESULT CALLED TO, READ BACK BY AND VERIFIED WITH: RN J [id number] FCP NO ANAEROBES ISOLATED Performed at [facility], [place] [address], [place], [place] [address]    Report Status 01/27/2020 FINAL  Final   Organism ID, Bacteria PSEUDOMONAS AERUGINOSA  Final      Susceptibility   Pseudomonas aeruginosa - MIC*    CEFTAZIDIME 4 SENSITIVE Sensitive     CIPROFLOXACIN 0.5 SENSITIVE Sensitive     GENTAMICIN <=1 SENSITIVE Sensitive     IMIPENEM 1 SENSITIVE Sensitive     PIP/TAZO 8 SENSITIVE Sensitive     CEFEPIME 2 SENSITIVE Sensitive     * RARE PSEUDOMONAS AERUGINOSA  Acid Fast Smear (AFB)     Status: None   Collection Time: 01/22/20  5:06 PM   Specimen: PATH Bone biopsy; Tissue  Result Value Ref Range Status   AFB Specimen Processing Comment  Final    Comment: Tissue Grinding and  Digestion/Decontamination   Acid Fast Smear Negative  Final    Comment: (NOTE) Performed At: [facility] [place], [place] [id number] Rush Farmer MD DS:[id number]    Source (AFB) 2  Final    Comment: TISSUE LUMBAR 1 Performed at [facility], [place] [address] [place],  [address]   Fungus Culture Result     Status: None   Collection Time: 01/22/20  5:06 PM  Result Value Ref Range Status   Result 1 Comment  Final    Comment: (NOTE) KOH/Calcofluor preparation:  no fungus observed. Performed At: [facility] [place], [place] [id number] Rush Farmer MD TX:[id number]      Radiology Studies: No results found.  Scheduled Meds: .  amLODipine  5 mg Oral Daily  . atorvastatin  10 mg Oral Daily  . Chlorhexidine Gluconate Cloth  6 each Topical Daily  . docusate sodium  100 mg Oral BID  . DULoxetine  60 mg Oral Daily  . enoxaparin (LOVENOX) injection  40 mg Subcutaneous Q24H  . feeding supplement (GLUCERNA SHAKE)  237 mL Oral TID BM  . insulin aspart  0-9 Units Subcutaneous TID WC  . insulin glargine  20 Units Subcutaneous Daily  . insulin glargine  30 Units Subcutaneous Q2200  . lidocaine  1 patch Transdermal Q24H  . multivitamin with minerals  1 tablet Oral Daily  . pregabalin  150 mg Oral BID  . senna  1 tablet Oral BID  . sodium chloride flush  3 mL Intravenous Q12H   Continuous Infusions: . 0.9 % NaCl with KCl 20 mEq / L Stopped (01/24/20 2016)  . ceFEPime (MAXIPIME) IV Stopped (01/27/20 5974)     LOS: 7 days   Marylu Lund, MD [facility] Pager On Amion  If 7PM-7AM, please contact night-coverage 01/27/2020, 5:05 PM

## 2020-01-27 NOTE — PMR Pre-admission (Signed)
PMR Admission Coordinator Pre-Admission Assessment  Patient: [name] [name] is an 64 y.o., adult MRN: [id number] DOB: 11/27/1955 Height: 5' 1"  (154.9 cm) Weight: 81.7 kg  Insurance Information HMO: yes    PPO:      PCP:      IPA:      80/20:      OTHER: PRIMARY: [employer]      Policy#: [id number]      Subscriber: patient CM Name: [name] [name]      Phone#: [phone]     Fax#: [phone] Pre-Cert#: [id number]      Employer:  [employer]m [employer]auf[employer] ([id number]) provided by [name] [name] for admit to CIR. Pt is approved for 1 week with start date 4/1 and last covered date 4/7. Clinical updates due 4/8. No CM assigned for follow up yet. [name] [name]'s (p): [phone]; fax clinicals (f): [phone] Benefits:  Phone #: [phone]     Name: [name] [name]. Date: 12/29/2018 - still active     Deduct: $500 ($500)      Out of Pocket Max: $2,500 ($2,500 met)      Life Max: NA CIR: 80% coverage, 20% co-insurance, limited by medical necessity      SNF: 80% coverage, 20% co-insurance, limited by medical necessity Outpatient: $50/visit co-pay pending service; combined 30 visits PT/OT     Home Health: 80% coverage, 20% co-insruance; limited by medical necessity      DME: 80% coverage, 20% co-insurance    Providers:  SECONDARY: None      Policy#:       Subscriber:  CM Name:       Phone#:      Fax#:  Pre-Cert#:       Employer:  Benefits:  Phone #:      Name:  Eff. Date:      Deduct:      Out of Pocket Max:       Life Max: CIR:       SNF:  Outpatient:      Co-Pay: Home Health:       Co-Pay:  DME:     Co-Pay:   Medicaid Application Date:       Case Manager:  Disability Application Date:       Case Worker:  The "Data Collection Information Summary" for patients in Inpatient Rehabilitation Facilities with attached "Privacy Act [place] Records" was provided and verbally reviewed with: N/A  Emergency Contact Information Contact Information    Name Relation Home Work Mobile   [name] [name] Spouse  [phone]        Current Medical History  Patient Admitting Diagnosis: Osteomyelitis of vertebra of thoracolumbar region, L1 lumbar discitis with paraspinal fluid enhancement   History of Present Illness: [name] [name] is a 64 year old female with history of anxiety, liver cirrhosis, fibromyalgia, PTSD, legally blind, diabetes mellitus, CKD stage III , chronic anemia, 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 Sharol Given as well as multiple I&D's of the right and left foot.  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  Per chart review patient lives with spouse.  1 level home 2 steps to entry.  Has not been ambulating for the past 3 weeks.  She had been doing stand pivot transfers from bed to bedside commode with assistance from her husband as well as requiring assistance for ADLs.  She has an aide 3 hours a day.  Presented 01/20/2020 with abdominal pain and  constipation as well as increasing back pain over the past 3 weeks.  CT abdomen pelvis showed interval development of L1 vertebral plana with marked bony resorption of the L1 vertebral body as well as erosive changes of the inferior T12 endplate and superior L2 endplate.  Overall appearance and rapid progression concerning for underlying discitis osteomyelitis.  No evidence of paraspinal abscess.  CT and MRI thoracic lumbar spine again showed erosive process destruction of much of the L1 vertebral body some involvement of adjacent endplates of G40 and L2.  There was involvement of bilateral psoas muscles with myositis.  There was some mild retropulsion and phlegmon resulting in spinal stenosis but no severe neural element impingement.  Admission chemistries blood cultures no growth to date, SARS coronavirus negative, creatinine 1.55, hemoglobin 8.8, surgical PCR screen positive.  Patient underwent L1 corpectomy via left lateral retroperitoneal  and retropleural approach reconstruction with expandable titanium cage percutaneous segmental instrumentation with pedicle screw fixation T11, T12, L2 and L3 01/22/2020 per Dr. Duffy Rhody.  TLSO back brace recommended applied in supine position follow-up infectious disease Pseudomonas growing in tissue culture recommendations were for cefepime through 03/05/2020 to complete 6-week course.  Subcutaneous Lovenox for DVT prophylaxis.  Hospital course acute on chronic anemia hemoglobin 7.2 and latest creatinine 1.24.  Therapy evaluations completed and patient is to be admitted for a comprehensive rehab program on 01/29/20.     Patient's medical record from [facility] has been reviewed by the rehabilitation admission coordinator and physician.  Past Medical History  Past Medical History:  Diagnosis Date  . Acute MI [facility]) 2007   presented to ED & had cardiac cath- but found to have normal coronaries. Since that point in time her PCP cares f or cardiac needs. Dr. [name] - [facility]  . Anemia   . Anginal pain ([name])   . Anxiety   . Asthma   . Bulging lumbar disc   . Cataract   . 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 Cirrhosis of liver without mention of alcohol   . Constipation   . Dehiscence of closure of skin    left partial calcaneal excision  . Depression   . Diabetes mellitus    insulin dependent, adult onset  . Episode of visual loss of left eye   . Exertional shortness of breath   . Fatty liver   . Fibromyalgia   . GERD (gastroesophageal reflux disease)   . Hepatic steatosis   . High cholesterol   . Hypertension   . MRSA (methicillin resistant Staphylococcus aureus)   . Neuropathy    lower legs  . Osteoarthritis    hands, hips  . Proximal humerus fracture 10/15/12   Left  . PTSD (post-traumatic stress disorder)   . Renal insufficiency 05/05/2015    Family History   family history includes  Cancer in her father; Colitis in her father; Crohn's disease in her father; Diabetes in her father and mother; Diabetes Mellitus I in her brother; Diabetes Mellitus II in her brother, brother, brother, and brother; Heart attack in her brother; Heart disease in her brother, brother, and father; Hypertension in her mother; Irritable bowel syndrome in her daughter; Kidney disease in her brother, brother, brother, brother, and brother; Leukemia in her father; Liver disease in her brother and brother; Mental illness in her mother.  Prior Rehab/Hospitalizations Has the patient had prior rehab or hospitalizations prior to admission? Yes  Has the patient had major surgery during 100 days prior to admission? Yes   Current Medications  Current Facility-Administered Medications:  .  0.9 % NaCl with KCl 20 mEq/ L  infusion, , Intravenous, Continuous, Vallarie Mare, MD, Stopped at 01/24/20 2016 .  acetaminophen (TYLENOL) tablet 650 mg, 650 mg, Oral, Q6H PRN, Donne Hazel, MD, 650 mg at 01/24/20 2030 .  amLODipine (NORVASC) tablet 5 mg, 5 mg, Oral, Daily, Vallarie Mare, MD, 5 mg at 01/27/20 0825 .  atorvastatin (LIPITOR) tablet 10 mg, 10 mg, Oral, Daily, Vallarie Mare, MD, 10 mg at 01/27/20 0825 .  bisacodyl (DULCOLAX) suppository 10 mg, 10 mg, Rectal, Daily PRN, Vallarie Mare, MD .  ceFEPIme (MAXIPIME) 2 g in sodium chloride 0.9 % 100 mL IVPB, 2 g, Intravenous, Q12H, Alvira Philips, [place], Stopped at 01/27/20 [phone]  Chlorhexidine Gluconate Cloth 2 % PADS 6 each, 6 each, Topical, Daily, Donne Hazel, MD, 6 each at 01/27/20 [phone] .  diazepam (VALIUM) tablet 5 mg, 5 mg, Oral, Q6H PRN, Vallarie Mare, MD, 5 mg at 01/23/20 1749 .  docusate sodium (COLACE) capsule 100 mg, 100 mg, Oral, BID, Vallarie Mare, MD, 100 mg at 01/27/20 0258 .  DULoxetine (CYMBALTA) DR capsule 60 mg, 60 mg, Oral, Daily, Vallarie Mare, MD, 60 mg at 01/27/20 5277 .  enoxaparin (LOVENOX) injection 40  mg, 40 mg, Subcutaneous, Q24H, Vallarie Mare, MD, 40 mg at 01/27/20 8242 .  feeding supplement (GLUCERNA SHAKE) (GLUCERNA SHAKE) liquid 237 mL, 237 mL, Oral, TID BM, Vallarie Mare, MD, 237 mL at 01/27/20 0827 .  hydrALAZINE (APRESOLINE) injection 10 mg, 10 mg, Intravenous, Q6H PRN, Vallarie Mare, MD .  HYDROmorphone (DILAUDID) injection 0.5 mg, 0.5 mg, Intravenous, Q2H PRN, Vallarie Mare, MD, 0.5 mg at 01/24/20 0604 .  insulin aspart (novoLOG) injection 0-9 Units, 0-9 Units, Subcutaneous, TID WC, Vallarie Mare, MD, 1 Units at 01/27/20 1253 .  insulin glargine (LANTUS) injection 20 Units, 20 Units, Subcutaneous, Daily, Vallarie Mare, MD, 20 Units at 01/27/20 0827 .  insulin glargine (LANTUS) injection 30 Units, 30 Units, Subcutaneous, Q2200, Vallarie Mare, MD, 30 Units at 01/26/20 2147 .  lidocaine (LIDODERM) 5 % 1 patch, 1 patch, Transdermal, Q24H, Vallarie Mare, MD, 1 patch at 01/26/20 2201 .  menthol-cetylpyridinium (CEPACOL) lozenge 3 mg, 1 lozenge, Oral, PRN **OR** phenol (CHLORASEPTIC) mouth spray 1 spray, 1 spray, Mouth/Throat, PRN, Vallarie Mare, MD .  multivitamin with minerals tablet 1 tablet, 1 tablet, Oral, Daily, Vallarie Mare, MD, 1 tablet at 01/27/20 [phone] .  ondansetron (ZOFRAN) tablet 4 mg, 4 mg, Oral, Q6H PRN **OR** ondansetron (ZOFRAN) injection 4 mg, 4 mg, Intravenous, Q6H PRN, Vallarie Mare, MD .  oxyCODONE (Oxy IR/ROXICODONE) immediate release tablet 10 mg, 10 mg, Oral, Q3H PRN, Vallarie Mare, MD, 10 mg at 01/27/20 1535 .  oxyCODONE (Oxy IR/ROXICODONE) immediate release tablet 5 mg, 5 mg, Oral, Q3H PRN, Vallarie Mare, MD, 5 mg at 01/23/20 1528 .  polyethylene glycol (MIRALAX / GLYCOLAX) packet 17 g, 17 g, Oral, Daily PRN, Vallarie Mare, MD, 17 g at 01/25/20 1616 .  pregabalin (LYRICA) capsule 150 mg, 150 mg, Oral, BID, Vallarie Mare, MD, 150 mg at 01/27/20 1443 .  senna (SENOKOT) tablet 8.6 mg, 1 tablet,  Oral, BID, Vallarie Mare, MD, 8.6 mg at 01/27/20 1540 .  sodium chloride flush (NS) 0.9 % injection 10-40 mL,  10-40 mL, Intracatheter, PRN, Donne Hazel, MD .  sodium chloride flush (NS) 0.9 % injection 3 mL, 3 mL, Intravenous, Q12H, Vallarie Mare, MD, 3 mL at 01/27/20 0829 .  sodium chloride flush (NS) 0.9 % injection 3 mL, 3 mL, Intravenous, PRN, Vallarie Mare, MD .  sodium phosphate (FLEET) 7-19 GM/118ML enema 1 enema, 1 enema, Rectal, Once PRN, Vallarie Mare, MD  Patients Current Diet:  Diet Order            Diet heart healthy/carb modified Room service appropriate? Yes; Fluid consistency: Thin  Diet effective now              Precautions / Restrictions Precautions Precautions: Back, Fall Precaution Booklet Issued: Yes (comment) Precaution Comments: Pt instructed in back precautions  Spinal Brace: Thoracolumbosacral orthotic, Applied in supine position Restrictions Weight Bearing Restrictions: No Other Position/Activity Restrictions: TLSO donned in supine, must be worn when [facility] >30deg   Has the patient had 2 or more falls or a fall with injury in the past year? Yes  Prior Activity Level Limited Community (1-2x/wk): has had a decline since back injury in Jan. limited community ambulation; required bed<>chair transfers due to progressive pain. used scooter at times.   Prior Functional Level Self Care: Did the patient need help bathing, dressing, using the toilet or eating? Needed some help ever since injury in Jan   Indoor Mobility: Did the patient need assistance with walking from room to room (with or without device)? Needed some help ever since injury in Jan-progressing to transfers only  Stairs: Did the patient need assistance with internal or external stairs (with or without device)? Needed some help ever since injury in Jan. Progressed to dependence  Functional Cognition: Did the patient need help planning regular tasks such as shopping or  remembering to take medications? Needed some help  Home Assistive Devices / [place] Devices/Equipment: Transport planner Home Equipment: Environmental consultant - 2 wheels, Bedside commode, Electric scooter  Prior Device Use: Indicate devices/aids used by the patient prior to current illness, exacerbation or injury? no AD use prior to back injury; progressed to scooter and bed <> chair transfers   Current Functional Level Cognition  Overall Cognitive Status: Within Functional Limits for tasks assessed Orientation Level: Oriented X4 General Comments: Cognition WFL for tasks assessed     Extremity Assessment (includes Sensation/Coordination)  Upper Extremity Assessment: RUE deficits/detail, LUE deficits/detail RUE Deficits / Details: provided pt with red tubing for builtup handles for self-feeding. pt demonstrates decreased sensation requiring assistance to located dropped food RUE Sensation: decreased proprioception, decreased light touch RUE Coordination: decreased fine motor, decreased gross motor LUE Deficits / Details: same as RUE LUE Sensation: decreased light touch, decreased proprioception LUE Coordination: decreased fine motor, decreased gross motor  Lower Extremity Assessment: Defer to PT evaluation    ADLs  Overall ADL's : Needs assistance/impaired Eating/Feeding: Sitting, Set up Eating/Feeding Details (indicate cue type and reason): Assist to locate dropped food items and locate items on food tray;pt utilized built up handles for self feeding Grooming: Wash/dry hands, Wash/dry face, Set up, Bed level Upper Body Bathing: Maximal assistance, Bed level Lower Body Bathing: Total assistance, Bed level Upper Body Dressing : Total assistance, Bed level Lower Body Dressing: Total assistance Lower Body Dressing Details (indicate cue type and reason): totalA to don socks Toilet Transfer: Moderate assistance, Buyer, retail Details (indicate cue type and reason):  simulated to/from recliner Toileting- Clothing Manipulation and Hygiene: Total assistance, Bed level Functional  mobility during ADLs: Moderate assistance, +2 for physical assistance, +2 for safety/equipment, Rolling walker General ADL Comments: discussed compensatory strategies for self-feeding due to decreased functional use of UE and decreased vision;    Mobility  Overal bed mobility: Needs Assistance Bed Mobility: Rolling, Sidelying to Sit, Sit to Supine Rolling: Min assist, +2 for safety/equipment Sidelying to sit: Mod assist, +2 for physical assistance, +2 for safety/equipment Supine to sit: Mod assist General bed mobility comments: Pt able to assist with rolling. Mod A +2 for rolling to maintain precautions and donning of TLSO in supine. Min A to sit EOB- assist to get trunk upright and scoot anteriorly. Pt requires inc time and explanation of planned movements; she was able to describe an overview of how to don her brace to OT    Transfers  Overall transfer level: Needs assistance Equipment used: Rolling walker (2 wheeled) Transfers: Sit to/from Stand Sit to Stand: Min assist, +2 safety/equipment General transfer comment: Cues for hand placement and safety and min assist to steady; Good rise form slightly elevated bed; cues to self-monitor for activity tolerance    Ambulation / Gait / Stairs / Wheelchair Mobility  Ambulation/Gait Ambulation/Gait assistance: Min assist, +2 safety/equipment Gait Distance (Feet): (Pivotal steps bed to chair) Assistive device: Rolling walker (2 wheeled) Gait Pattern/deviations: Shuffle General Gait Details: Did not push progressive ambulation due to dizziness with upright activity; limited step to just in front of recliner while we took standing BP, which showed quite a drop, so sat down straightaway    Posture / Balance Dynamic Sitting Balance Sitting balance - Comments: Pt able to sit without UE support if requested. Prefers B UE support. Requires  VC to avoid posterior lean Balance Overall balance assessment: Needs assistance Sitting-balance support: Bilateral upper extremity supported, Feet supported Sitting balance-Leahy Scale: Fair Sitting balance - Comments: Pt able to sit without UE support if requested. Prefers B UE support. Requires VC to avoid posterior lean Standing balance-Leahy Scale: Poor    Special needs/care consideration BiPAP/CPAP : not at home PTA, but there is mention of continuing trial of CPAP while in hospital CPM : no Continuous Drip IV : cefepime  Dialysis : no        Days : no Life Vest : no Oxygen : no, on RA Special Bed : no Trach Size : no Wound Vac (area) : no      Location : no Skin : amputation to right toe, MASD to right, left breast; closed incision to flank, closed incision to back, open diabetic ulcer to left heel.               Bowel mgmt: last BM 01/24/20, continent Bladder mgmt: continent Diabetic mgmt: yes Behavioral consideration : no Chemo/radiation : no   Previous Home Environment (from acute therapy documentation) Living Arrangements: Spouse/significant other Available Help at Discharge: Family, Available PRN/intermittently, Personal care attendant(spouse indicates aide 3 hours/day ) Type of Home: House Home Layout: One level Home Access: Stairs to enter [employer] of Steps: 2 Home Care Services: Yes Type of Home Care Services: Homehealth aide  Discharge Living Setting Plans for Discharge Living Setting: Patient's home, Lives with (comment)(lives with husband) Type of Home at Discharge: House Discharge Home Layout: One level Discharge Home Access: Level entry Discharge Bathroom Shower/Tub: Walk-in shower Discharge Bathroom Toilet: Handicapped height Discharge Bathroom Accessibility: Yes How Accessible: Accessible via wheelchair Does the patient have any problems obtaining your medications?: No  Social/Family/Support Systems Patient Roles: Spouse Contact  Information: husband:  [name] [name] [phone] Anticipated Caregiver: had established aide (53am-12 M-F); husband can be home for lunch 12-1pm, then once again as needed between 1-6pm. Pt will be home alone from 1-6pm (neighbor reportedly can check in during that time as well) Anticipated Caregiver's Contact Information: see above Ability/Limitations of Caregiver: husband: MinA; Aide has been assisting with bathing/dressing at Mod/Max A level Caregiver Availability: Intermittent(Aide (9am-12pm M-F). husband can be home for lunch) Discharge Plan Discussed with Primary Caregiver: Yes(husband) Is Caregiver In Agreement with Plan?: Yes Does Caregiver/Family have Issues with Lodging/Transportation while Pt is in Rehab?: No  Goals/Additional Needs Patient/Family Goal for Rehab: PT/OT: Mod I, intermittant supervision; SLP: NA Expected length of stay: 8-12 days Cultural Considerations: NA Dietary Needs: heart healthy/carb modified thin liquids.  Equipment Needs: TBD Special Service Needs: on IV antibiotics Pt/Family Agrees to Admission and willing to participate: Yes Program Orientation Provided & Reviewed with Pt/Caregiver Including Roles  & Responsibilities: Yes(pt and husband)  Barriers to Discharge: IV antibiotics, Other (comments)  Barriers to Discharge Comments: brace donning TLSO in supine; on IV antoibiotics (per pt, husband is used to assisting with this).   Decrease burden of Care through IP rehab admission: NA  Possible need for SNF placement upon discharge: Not anticipated. Pt has good support her a hired Engineer, production and her husband has a flexible job where he can run home to check on her during the day as needed. Anticipate pt can reach a Mod I/intermittant supervision level after CIR stay.   Patient Condition: I have reviewed medical records from [facility], spoken with OT, and patient and spouse. I met with patient at the bedside for inpatient rehabilitation assessment.  Patient  will benefit from ongoing PT and OT, can actively participate in 3 hours of therapy a day 5 days of the week, and can make measurable gains during the admission.  Patient will also benefit from the coordinated team approach during an Inpatient Acute Rehabilitation admission.  The patient will receive intensive therapy as well as Rehabilitation physician, nursing, social worker, and care management interventions.  Due to safety, skin/wound care, disease management, medication administration, pain management and patient education the patient requires 24 hour a day rehabilitation nursing.  The patient is currently Min A with mobility and basic ADLs.  Discharge setting and therapy post discharge at home with home health is anticipated.  Patient has agreed to participate in the Acute Inpatient Rehabilitation Program and will admit 01/29/20.  Preadmission Screen Completed By:  Raechel Ache, 01/27/2020 5:14 PM ______________________________________________________________________   Discussed status with Dr. Dagoberto Ligas on 01/29/20 at 2:01PM and received approval for admission today.  Admission Coordinator:  Raechel Ache, OT, time 2:01PM/Date 01/29/20   Assessment/Plan: Diagnosis: 1. Does the need for close, 24 hr/day Medical supervision in concert with the patient's rehab needs make it unreasonable for this patient to be served in a less intensive setting? Yes 2. 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 Due to bladder management, bowel management, safety, skin/wound care, disease management, medication administration, pain management and patient education, does the patient require 24 hr/day rehab nursing? Yes 4. Does the patient require coordinated care of a physician, rehab nurse, PT, OT, and SLP to address physical and functional deficits in the context of the above medical diagnosis(es)? Yes Addressing deficits in  the following areas: balance, endurance, locomotion, strength, transferring, bathing, dressing, feeding, grooming and toileting 5. Can the patient actively participate in an intensive  therapy program of at least 3 hrs of therapy 5 days a week? Yes 6. The potential for patient to make measurable gains while on inpatient rehab is fair 7. Anticipated functional outcomes upon discharge from inpatient rehab: supervision PT, supervision OT, n/a SLP 8. Estimated rehab length of stay to reach the above functional goals is: 8-12 days 9. Anticipated discharge destination: Home 10. Overall Rehab/Functional Prognosis: fair   MD Signature:

## 2020-01-27 NOTE — Progress Notes (Signed)
Pharmacy Antibiotic Note  [name] [name] is a 64 y.o. adult admitted on 01/20/2020 with osteomyelitis. 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 S/p lumbar debridement and hardware stabilization on 3/25. Bone biopsy growing Pseudomonas. Pharmacy has been consulted for cefepime dosing.  Afebrile, WBC are normal, and renal function is stable. Plan is to treat 6 weeks through 5/7.   Plan Cefepime 2 gm IV q12h Watch renal fxn closely and adjust dose as indicated OPAT orders completed 3/28    Height: 5' 1"  (154.9 cm) Weight: 180 lb 0.8 oz (81.7 kg) IBW/kg (Calculated) : 47.8  Temp (24hrs), Avg:98.4 F (36.9 C), Min:97.7 F (36.5 C), Max:99 F (37.2 C)  Recent Labs  Lab 01/23/20 0323 01/24/20 0253 01/25/20 0435 01/26/20 0551 01/27/20 0434  WBC 10.6* 8.8 7.0 6.3 5.8  CREATININE 1.67* 1.65* 1.42* 1.33* 1.29*    Estimated Creatinine Clearance (by C-G formula based on SCr of 1.29 mg/dL (H)) Female: 42.7 mL/min (A) Female: 52.5 mL/min (A)    Allergies  Allergen Reactions  . Bee Pollen Anaphylaxis  . Fish-Derived Products Hives, Shortness Of Breath, Swelling and Rash    Hives get in throat causing trouble breathing  . Mushroom Extract Complex Anaphylaxis  . Penicillins Anaphylaxis    Did it involve swelling of the face/tongue/throat, SOB, or low BP? Yes Did it involve sudden or severe rash/hives, skin peeling, or any reaction on the inside of your mouth or nose? No Did you need to seek medical attention at a hospital or doctor's office? Yes When did it last happen?A few months ago If all above answers are "NO", may proceed with cephalosporin use.  [name] Kitchen Rosemary Oil Anaphylaxis  . Shellfish Allergy Hives, Shortness Of Breath, Swelling and Rash  . Tomato Hives and Shortness Of Breath    Hives in throat causes her trouble breathing  . Acetaminophen Other (See Comments)    GI upset  . Acyclovir And Related Other (See Comments)    Unknown rxn  . Aloe  Vera Hives  . Broccoli [Brassica Oleracea] Hives  . Naproxen Other (See Comments)    Unknown rxn   Antimicrobials this admission: Levofloxacin 3/25 >> 3/26 Vancomycin 3/25 >> 3/27 Rifampin 3/26 >> 3/27 Cefepime 3/27 >> (5/7)  Microbiology this admission: 3/23 BCx >> neg 3/23 COVID negative 3/24 MRSA PCR negative 3/25 Bone biopsy >> rare GPC in Gram stain, rare GNR in culture>>rare Pseudomonas, S to cefepime  Thank you for involving pharmacy in this patient's care.  Renold Genta, PharmD, BCPS Clinical Pharmacist Clinical phone for 01/27/2020 until 3p is [phone] 01/27/2020 1:36 PM  **Pharmacist phone directory can be found on [email or web address] listed under [place]**

## 2020-01-27 NOTE — Progress Notes (Signed)
Neurosurgery Service Progress Note  Subjective: No acute events overnight, stable  back / left sided abdominal / pain with hip flexion, no new complaints today  Objective: Vitals:   01/26/20 2001 01/27/20 0324 01/27/20 0824 01/27/20 0826  BP: 122/60 (!) 144/66 (!) 157/67 (!) 157/67  Pulse: 89 76  75  Resp: 17 17  14   Temp: 99 F (37.2 C) 97.7 F (36.5 C)  98.4 F (36.9 C)  TempSrc: Oral Oral  Oral  SpO2: 92% 94%  97%  Weight:      Height:       Temp (24hrs), Avg:98.4 F (36.9 C), Min:97.7 F (36.5 C), Max:99 F (37.2 C)  CBC Latest Ref Rng & Units 01/27/2020 01/26/2020 01/25/2020  WBC 4.0 - 10.5 K/uL 5.8 6.3 7.0  Hemoglobin 12.0 - 15.0 g/dL 7.4(L) 7.8(L) 7.2(L)  Hematocrit 36.0 - 46.0 % 24.3(L) 25.1(L) 23.7(L)  Platelets 150 - 400 K/uL 191 186 159   BMP Latest Ref Rng & Units 01/27/2020 01/26/2020 01/25/2020  Glucose 70 - 99 mg/dL 208(H) 93 154(H)  BUN 8 - 23 mg/dL 14 14 18   Creatinine 0.44 - 1.00 mg/dL 1.29(H) 1.33(H) 1.42(H)  BUN/Creat Ratio 6 - 22 (calc) - - -  Sodium 135 - 145 mmol/L 138 138 136  Potassium 3.5 - 5.1 mmol/L 3.7 3.8 4.1  Chloride 98 - 111 mmol/L 103 105 105  CO2 22 - 32 mmol/L 26 25 24   Calcium 8.9 - 10.3 mg/dL 8.5(L) 8.6(L) 8.2(L)    Intake/Output Summary (Last 24 hours) at 01/27/2020 1136 Last data filed at 01/27/2020 0900 Gross per 24 hour  Intake 1433 ml  Output --  Net 1433 ml    Current Facility-Administered Medications:  .  0.9 %  sodium chloride infusion, 250 mL, Intravenous, Continuous, Vallarie Mare, MD, Stopped at 01/22/20 2326 .  0.9 % NaCl with KCl 20 mEq/ L  infusion, , Intravenous, Continuous, Vallarie Mare, MD, Stopped at 01/24/20 2016 .  acetaminophen (TYLENOL) tablet 650 mg, 650 mg, Oral, Q6H PRN, Donne Hazel, MD, 650 mg at 01/24/20 2030 .  amLODipine (NORVASC) tablet 5 mg, 5 mg, Oral, Daily, Vallarie Mare, MD, 5 mg at 01/27/20 0825 .  atorvastatin (LIPITOR) tablet 10 mg, 10 mg, Oral, Daily, Vallarie Mare,  MD, 10 mg at 01/27/20 0825 .  bisacodyl (DULCOLAX) suppository 10 mg, 10 mg, Rectal, Daily PRN, Vallarie Mare, MD .  ceFEPIme (MAXIPIME) 2 g in sodium chloride 0.9 % 100 mL IVPB, 2 g, Intravenous, Q12H, Alvira Philips, [place], Last Rate: 200 mL/hr at 01/27/20 0819, 2 g at 01/27/20 0819 .  Chlorhexidine Gluconate Cloth 2 % PADS 6 each, 6 each, Topical, Daily, Donne Hazel, MD, 6 each at 01/27/20 [phone] .  diazepam (VALIUM) tablet 5 mg, 5 mg, Oral, Q6H PRN, Vallarie Mare, MD, 5 mg at 01/23/20 1749 .  docusate sodium (COLACE) capsule 100 mg, 100 mg, Oral, BID, Vallarie Mare, MD, 100 mg at 01/27/20 2094 .  DULoxetine (CYMBALTA) DR capsule 60 mg, 60 mg, Oral, Daily, Vallarie Mare, MD, 60 mg at 01/27/20 7096 .  enoxaparin (LOVENOX) injection 40 mg, 40 mg, Subcutaneous, Q24H, Vallarie Mare, MD, 40 mg at 01/27/20 2836 .  feeding supplement (GLUCERNA SHAKE) (GLUCERNA SHAKE) liquid 237 mL, 237 mL, Oral, TID BM, Vallarie Mare, MD, 237 mL at 01/27/20 0827 .  hydrALAZINE (APRESOLINE) injection 10 mg, 10 mg, Intravenous, Q6H PRN, Vallarie Mare, MD .  HYDROmorphone (DILAUDID) injection  0.5 mg, 0.5 mg, Intravenous, Q2H PRN, Vallarie Mare, MD, 0.5 mg at 01/24/20 0604 .  insulin aspart (novoLOG) injection 0-9 Units, 0-9 Units, Subcutaneous, TID WC, Vallarie Mare, MD, 2 Units at 01/27/20 [phone] .  insulin glargine (LANTUS) injection 20 Units, 20 Units, Subcutaneous, Daily, Vallarie Mare, MD, 20 Units at 01/27/20 0827 .  insulin glargine (LANTUS) injection 30 Units, 30 Units, Subcutaneous, Q2200, Vallarie Mare, MD, 30 Units at 01/26/20 2147 .  lidocaine (LIDODERM) 5 % 1 patch, 1 patch, Transdermal, Q24H, Vallarie Mare, MD, 1 patch at 01/26/20 2201 .  menthol-cetylpyridinium (CEPACOL) lozenge 3 mg, 1 lozenge, Oral, PRN **OR** phenol (CHLORASEPTIC) mouth spray 1 spray, 1 spray, Mouth/Throat, PRN, Vallarie Mare, MD .  multivitamin with minerals tablet 1  tablet, 1 tablet, Oral, Daily, Vallarie Mare, MD, 1 tablet at 01/27/20 [phone] .  ondansetron (ZOFRAN) tablet 4 mg, 4 mg, Oral, Q6H PRN **OR** ondansetron (ZOFRAN) injection 4 mg, 4 mg, Intravenous, Q6H PRN, Vallarie Mare, MD .  ondansetron [facility]) tablet 4 mg, 4 mg, Oral, Q6H PRN **OR** ondansetron (ZOFRAN) injection 4 mg, 4 mg, Intravenous, Q6H PRN, Vallarie Mare, MD .  oxyCODONE (Oxy IR/ROXICODONE) immediate release tablet 10 mg, 10 mg, Oral, Q3H PRN, Vallarie Mare, MD, 10 mg at 01/27/20 0825 .  oxyCODONE (Oxy IR/ROXICODONE) immediate release tablet 5 mg, 5 mg, Oral, Q3H PRN, Vallarie Mare, MD, 5 mg at 01/23/20 1528 .  polyethylene glycol (MIRALAX / GLYCOLAX) packet 17 g, 17 g, Oral, Daily PRN, Vallarie Mare, MD, 17 g at 01/25/20 1616 .  pregabalin (LYRICA) capsule 150 mg, 150 mg, Oral, BID, Vallarie Mare, MD, 150 mg at 01/27/20 1950 .  senna (SENOKOT) tablet 8.6 mg, 1 tablet, Oral, BID, Vallarie Mare, MD, 8.6 mg at 01/27/20 9326 .  sodium chloride flush (NS) 0.9 % injection 10-40 mL, 10-40 mL, Intracatheter, PRN, Donne Hazel, MD .  sodium chloride flush (NS) 0.9 % injection 3 mL, 3 mL, Intravenous, Q12H, Vallarie Mare, MD, 3 mL at 01/27/20 0829 .  sodium chloride flush (NS) 0.9 % injection 3 mL, 3 mL, Intravenous, PRN, Vallarie Mare, MD .  sodium phosphate (FLEET) 7-19 GM/118ML enema 1 enema, 1 enema, Rectal, Once PRN, Vallarie Mare, MD   Physical Exam: AOx3, PERRL, EOMI, FS, Strength 5/5 x4 except pain limited 4+/5 in L hip flexors, SILTx4, incisions c/d/i  Assessment & Plan: 64 y.o. woman s/p anterolateral corpectomy with PSIF for osteomyelitis, recovering well.  -no change in neurosurgical plan of care, okay for DVT chemoprophylaxis as needed, activity as tolerated, brace when out of bed  Judith Part  01/27/20 11:36 AM

## 2020-01-27 NOTE — Progress Notes (Signed)
Occupational Therapy Treatment Patient Details Name: Jaysie Benthall MRN: [id number] DOB: Mar 21, 1956 Today's Date: 01/27/2020    History of present illness Elnor Maryrose Colvin is a 64 y.o. adult with PMH signficant for diabetes mellitus type 2, chronic kidney disease stage III,  anemia, HTN, anxiety, GERD, fibromyalgia, depression, PTSD, and is legally blind. She was admitted for L1 osteomyelitis and spinal instability. S/p debridement and stabilization T11-12 & L2-3 (3/25)    OT comments  Session focused on increasing pt's independence with self-feeding. Provided pt with red tubing for built up handles. Pt demonstrated improved control of utensil with feeding. She required assistance to locate items on tray, open containers and find dropped food items. Pt reports double vision and triple vision at times causing pt to have difficulty with depth perception. Educated her on [employer] and provided her with the company's information. Pt will continue to benefit from skilled OT services to maximize safety and independence with ADL/IADL and functional mobility. Will continue to follow acutely and progress as tolerated.    Follow Up Recommendations  Supervision/Assistance - 24 hour;CIR    Equipment Recommendations  None recommended by OT    Recommendations for Other Services      Precautions / Restrictions Precautions Precautions: Back;Fall Precaution Booklet Issued: Yes (comment) Precaution Comments: Pt instructed in back precautions  Required Braces or Orthoses: Spinal Brace Spinal Brace: Thoracolumbosacral orthotic;Applied in supine position Restrictions Weight Bearing Restrictions: No Other Position/Activity Restrictions: TLSO donned in supine, must be worn when HOB >30deg       Mobility Bed Mobility Overal bed mobility: Needs Assistance Bed Mobility: Rolling;Sidelying to Sit;Sit to Supine Rolling: Min assist;+2 for safety/equipment Sidelying to sit: Mod assist;+2 for  physical assistance;+2 for safety/equipment       General bed mobility comments: Pt able to assist with rolling. Mod A +2 for rolling to maintain precautions and donning of TLSO in supine. Min A to sit EOB- assist to get trunk upright and scoot anteriorly. Pt requires inc time and explanation of planned movements; she was able to describe an overview of how to don her brace to OT  Transfers Overall transfer level: Needs assistance Equipment used: Rolling walker (2 wheeled) Transfers: Sit to/from Stand Sit to Stand: Min assist;+2 safety/equipment         General transfer comment: Cues for hand placement and safety and min assist to steady; Good rise form slightly elevated bed; cues to self-monitor for activity tolerance    Balance Overall balance assessment: Needs assistance Sitting-balance support: Bilateral upper extremity supported;Feet supported Sitting balance-Leahy Scale: Fair Sitting balance - Comments: Pt able to sit without UE support if requested. Prefers B UE support. Requires VC to avoid posterior lean     Standing balance-Leahy Scale: Poor                             ADL either performed or assessed with clinical judgement   ADL Overall ADL's : Needs assistance/impaired Eating/Feeding: Sitting;Set up Eating/Feeding Details (indicate cue type and reason): Assist to locate dropped food items and locate items on food tray;pt utilized built up handles for self feeding Grooming: Wash/dry hands;Wash/dry face;Set up;Bed level               Lower Body Dressing: Total assistance Lower Body Dressing Details (indicate cue type and reason): totalA to don socks Toilet Transfer: Moderate assistance;Stand-pivot Toilet Transfer Details (indicate cue type and reason): simulated to/from recliner  Functional mobility during ADLs: Moderate assistance;+2 for physical assistance;+2 for safety/equipment;Rolling walker General ADL Comments: discussed  compensatory strategies for self-feeding due to decreased functional use of UE and decreased vision;     Vision       Perception     Praxis      Cognition Arousal/Alertness: Awake/alert Behavior During Therapy: Anxious;WFL for tasks assessed/performed Overall Cognitive Status: Within Functional Limits for tasks assessed                                          Exercises     Shoulder Instructions       General Comments     Pertinent Vitals/ Pain       Pain Assessment: 0-10 Pain Score: 9  Faces Pain Scale: Hurts even more Pain Location: lumbar region Pain Descriptors / Indicators: Aching;Discomfort;Operative site guarding;Sore Pain Intervention(s): Limited activity within patient's tolerance;Monitored during session;Repositioned  Home Living                                          Prior Functioning/Environment              Frequency  Min 2X/week        Progress Toward Goals  OT Goals(current goals can now be found in the care plan section)  Progress towards OT goals: Progressing toward goals  Acute Rehab OT Goals Patient Stated Goal: to have less pain  OT Goal Formulation: With patient Time For Goal Achievement: 02/06/20 Potential to Achieve Goals: Good ADL Goals Pt Will Perform Grooming: with set-up;sitting Pt Will Perform Upper Body Bathing: with min assist;sitting Pt Will Transfer to Toilet: with mod assist;stand pivot transfer;bedside commode Pt Will Perform Toileting - Clothing Manipulation and hygiene: with max assist;sit to/from stand  Plan Discharge plan remains appropriate    Co-evaluation    PT/OT/SLP Co-Evaluation/Treatment: Yes Reason for Co-Treatment: For patient/therapist safety;To address functional/ADL transfers PT goals addressed during session: Mobility/safety with mobility OT goals addressed during session: ADL's and self-care      AM-PAC OT "6 Clicks" Daily Activity     Outcome  Measure   Help from another person eating meals?: A Little Help from another person taking care of personal grooming?: A Little Help from another person toileting, which includes using toliet, bedpan, or urinal?: Total Help from another person bathing (including washing, rinsing, drying)?: Total Help from another person to put on and taking off regular upper body clothing?: Total Help from another person to put on and taking off regular lower body clothing?: Total 6 Click Score: 10    End of Session Equipment Utilized During Treatment: Other (comment)(built up handles)  OT Visit Diagnosis: Unsteadiness on feet (R26.81);Pain;Low vision, both eyes (H54.2) Pain - part of body: (back)   Activity Tolerance Patient tolerated treatment well   Patient Left in bed;with call bell/phone within reach;with bed alarm set;with SCD's reapplied   Nurse Communication Mobility status        Time: 3300-7622 OT Time Calculation (min): 28 min  Charges: OT General Charges $OT Visit: 1 Visit OT Treatments $Self Care/Home Management : 23-37 mins  Helene Kelp OTR/L Acute Rehabilitation Services Office: [place] 01/27/2020, 1:30 PM

## 2020-01-27 NOTE — TOC Progression Note (Signed)
Transition of Care [facility]) - Progression Note    Patient Details  Name: Kasmira Cacioppo MRN: [id number] Date of Birth: April 21, 1956  Transition of Care [facility]) CM/SW [place], RN Phone Number: 01/27/2020, 8:52 AM  Clinical Narrative:     Case management met with the patient yesterday, 01/26/2020 to offer Choice Medicare for SNF placement for short-term rehab as requested by PT.  Follow-up documentation sent to PASSR review to include FL2, therapy notes, and H&P.  Will follow-up to have FL-2 to include PASSR number once established and approved.    Expected Discharge Plan: [place] Barriers to Discharge: Continued Medical Work up  Expected Discharge Plan and Services Expected Discharge Plan: [place] In-house Referral: Clinical Social Work   Post Acute Care Choice: [place] arrangements for the past 2 months: Single Family Home                                       Social Determinants of Health (SDOH) Interventions    Readmission Risk Interventions Readmission Risk Prevention Plan 01/23/2020 11/18/2019  Transportation Screening Complete Complete  Medication Review [employer]) Complete Referral to Pharmacy  PCP or Specialist appointment within 3-5 days of discharge Complete Complete  HRI or Home Care Consult Complete Complete  SW Recovery Care/Counseling Consult Complete Complete  Palliative Care Screening Complete Not [place] Complete Not Applicable  Some recent data might be hidden

## 2020-01-27 NOTE — Progress Notes (Signed)
PROGRESS NOTE    [name] [name]  QMV:[id number] DOB: 10-Jul-1956 DOA: 01/20/2020 PCP: Ann Held, DO    Brief Narrative:  [phone] with hx DM2, CKD stage 3, hx recent septic elbow with recent L1 compression fracture. Pt presents with worsened back pain, found to have L1 osteo. Neurosurgery and ID consulted  Assessment & Plan:   Principal Problem:   Osteomyelitis of vertebra of thoracolumbar region [facility]) Active Problems:   Essential hypertension   Hepatic cirrhosis (HCC)   CAD (coronary artery disease)   CKD (chronic kidney disease), stage III (HCC)   Ulcer of left heel, limited to breakdown of skin ([place])   Type 2 diabetes mellitus with hyperglycemia, with long-term current use of insulin (HCC)   Septic arthritis of elbow, right (HCC)   Paraspinal mass   Spinal stenosis of lumbar region  Principal Problem:   Osteomyelitis of vertebra of thoracolumbar region [facility]), L1 lumbar discitis with paraspinal fluid enhancement -Currently in significant pain, 8/10, worse on movement -Neurosurgery consulted, pt underwent surgery 3/25 -ID following, pseudomonas growing in tissue cx -Appreciate ID input. Recommendation for Cefepime through 03/05/20 to complete 6 weeks of tx -Plan for SNF vs CIR  Active Problems: Diabetes mellitus type 2, uncontrolled with hyperglycemia -Will continue with BID Lantus dosing with sliding scale coverage -Glucose trends remains stable at present -Hemoglobin A1c 12.5    CKD (chronic kidney disease), stage IIIb (HCC) -Baseline creatinine 1.5-1.6 -Renal function remains stable    Essential hypertension -BP somewhat elevated, placed on Norvasc 5 mg daily -continue with hydralazine IV as tolerated -Remains stable    Hepatic cirrhosis (HCC) -No acute issues at present    CAD (coronary artery disease) -Currently no chest pain or any anginal symptoms -Remains stable at this time  DVT prophylaxis: Lovenox subq Code Status: Full Family  Communication: Pt in room, family at bedside Disposition Plan: From home, plan SNF vs CIR when bed available   Consultants:   Neurosurgery  ID  Procedures:   L1 corpectomy and reconstruction with expandable cage, T11-L3 percutaneous posterior instrumentation 3/25  Antimicrobials: Anti-infectives (From admission, onward)   Start     Dose/Rate Route Frequency Ordered Stop   01/26/20 2200  ceFEPIme (MAXIPIME) 2 g in sodium chloride 0.9 % 100 mL IVPB     2 g 200 mL/hr over 30 Minutes Intravenous Every 12 hours 01/26/20 1149     01/26/20 1800  ceFEPIme (MAXIPIME) 2 g in sodium chloride 0.9 % 100 mL IVPB  Status:  Discontinued     2 g 200 mL/hr over 30 Minutes Intravenous Every 8 hours 01/26/20 1144 01/26/20 1149   01/24/20 1030  ceFEPIme (MAXIPIME) 2 g in sodium chloride 0.9 % 100 mL IVPB  Status:  Discontinued     2 g 200 mL/hr over 30 Minutes Intravenous Every 12 hours 01/24/20 1012 01/26/20 1144   01/24/20 1000  ceFEPIme (MAXIPIME) 2 g in sodium chloride 0.9 % 100 mL IVPB  Status:  Discontinued     2 g 200 mL/hr over 30 Minutes Intravenous Every 8 hours 01/24/20 0939 01/24/20 1012   01/23/20 2200  vancomycin (VANCOREADY) IVPB 500 mg/100 mL  Status:  Discontinued     500 mg 100 mL/hr over 60 Minutes Intravenous Every 24 hours 01/22/20 2200 01/24/20 1540   01/23/20 1430  rifampin (RIFADIN) capsule 600 mg  Status:  Discontinued     600 mg Oral Daily 01/23/20 1339 01/24/20 1540   01/23/20 1000  Levofloxacin (LEVAQUIN) IVPB 250 mg  Status:  Discontinued     250 mg 50 mL/hr over 60 Minutes Intravenous Every 24 hours 01/22/20 2141 01/23/20 1339   01/22/20 2300  vancomycin (VANCOCIN) IVPB 1000 mg/200 mL premix     1,000 mg 200 mL/hr over 60 Minutes Intravenous To Post Anesthesia Care Unit 01/22/20 2200 01/23/20 2300   01/22/20 1505  bacitracin 50,000 Units in sodium chloride 0.9 % 500 mL irrigation  Status:  Discontinued       As needed 01/22/20 1505 01/22/20 2020   01/22/20 0800   vancomycin (VANCOCIN) IVPB 1000 mg/200 mL premix     1,000 mg 200 mL/hr over 60 Minutes Intravenous To ShortStay Surgical 01/22/20 0330 01/22/20 1435      Subjective: Reports feeling better.  Objective: Vitals:   01/27/20 0324 01/27/20 0824 01/27/20 0826 01/27/20 1424  BP: (!) 144/66 (!) 157/67 (!) 157/67 (!) 126/49  Pulse: 76  75 82  Resp: 17  14 15   Temp: 97.7 F (36.5 C)  98.4 F (36.9 C) (!) 97.4 F (36.3 C)  TempSrc: Oral  Oral Oral  SpO2: 94%  97% 96%  Weight:      Height:        Intake/Output Summary (Last 24 hours) at 01/27/2020 1646 Last data filed at 01/27/2020 1505 Gross per 24 hour  Intake 1283 ml  Output --  Net 1283 ml   Filed Weights   01/20/20 1051 01/20/20 2300  Weight: 81.6 kg 81.7 kg    Examination: General exam: Conversant, in no acute distress Respiratory system: normal chest rise, clear, no audible wheezing Cardiovascular system: regular rhythm, s1-s2 Gastrointestinal system: Nondistended, nontender, pos BS Central nervous system: No seizures, no tremors Extremities: No cyanosis, no joint deformities Skin: No rashes, no pallor Psychiatry: Affect normal // no auditory hallucinations    Data Reviewed: I have personally reviewed following labs and imaging studies  CBC: Recent Labs  Lab 01/21/20 0619 01/22/20 0534 01/23/20 0323 01/24/20 0253 01/25/20 0435 01/26/20 0551 01/27/20 0434  WBC 7.7   < > 10.6* 8.8 7.0 6.3 5.8  NEUTROABS 5.2  --   --   --   --   --   --   HGB 8.8*   < > 9.2* 7.5* 7.2* 7.8* 7.4*  HCT 29.1*   < > 29.7* 25.0* 23.7* 25.1* 24.3*  MCV 84.6   < > 84.9 85.9 86.2 82.6 85.0  PLT 211   < > 204 172 159 186 191   < > = values in this interval not displayed.   Basic Metabolic Panel: Recent Labs  Lab 01/23/20 0323 01/24/20 0253 01/25/20 0435 01/26/20 0551 01/27/20 0434  NA 136 138 136 138 138  K 4.5 4.9 4.1 3.8 3.7  CL 100 107 105 105 103  CO2 23 24 24 25 26   GLUCOSE 302* 186* 154* 93 208*  BUN 22 23 18 14 14    CREATININE 1.67* 1.65* 1.42* 1.33* 1.29*  CALCIUM 8.6* 8.4* 8.2* 8.6* 8.5*   GFR: Estimated Creatinine Clearance (by C-G formula based on SCr of 1.29 mg/dL (H)) Female: 42.7 mL/min (A) Female: 52.5 mL/min (A) Liver Function Tests: Recent Labs  Lab 01/21/20 0619 01/22/20 0534 01/26/20 0551 01/27/20 0434  AST 10* 10* 13* 12*  ALT 8 6 11 9   ALKPHOS 95 90 69 68  BILITOT 0.6 0.7 0.7 0.6  PROT 6.9 6.8 6.1* 5.9*  ALBUMIN 2.1* 2.0* 1.7* 1.6*   No results for input(s): LIPASE, AMYLASE in the last 168 hours. No results for  input(s): AMMONIA in the last 168 hours. Coagulation Profile: Recent Labs  Lab 01/22/20 0534  INR 1.2   Cardiac Enzymes: No results for input(s): CKTOTAL, CKMB, CKMBINDEX, TROPONINI in the last 168 hours. BNP (last 3 results) No results for input(s): PROBNP in the last 8760 hours. HbA1C: No results for input(s): HGBA1C in the last 72 hours. CBG: Recent Labs  Lab 01/26/20 1142 01/26/20 1635 01/26/20 2002 01/27/20 0640 01/27/20 1131  GLUCAP 115* 139* 216* 170* 128*   Lipid Profile: No results for input(s): CHOL, HDL, LDLCALC, TRIG, CHOLHDL, LDLDIRECT in the last 72 hours. Thyroid Function Tests: No results for input(s): TSH, T4TOTAL, FREET4, T3FREE, THYROIDAB in the last 72 hours. Anemia Panel: No results for input(s): VITAMINB12, FOLATE, FERRITIN, TIBC, IRON, RETICCTPCT in the last 72 hours. Sepsis Labs: No results for input(s): PROCALCITON, LATICACIDVEN in the last 168 hours.  Recent Results (from the past 240 hour(s))  Blood culture (routine x 2)     Status: None   Collection Time: 01/20/20  1:34 PM   Specimen: BLOOD RIGHT HAND  Result Value Ref Range Status   Specimen Description BLOOD RIGHT HAND  Final   Special Requests   Final    BOTTLES DRAWN AEROBIC ONLY Blood Culture results may not be optimal due to an inadequate volume of blood received in culture bottles   Culture   Final    NO GROWTH 5 DAYS Performed at [facility], [place] [address], [place], [place] [address]    Report Status 01/25/2020 FINAL  Final  Blood culture (routine x 2)     Status: None   Collection Time: 01/20/20  3:28 PM   Specimen: BLOOD  Result Value Ref Range Status   Specimen Description BLOOD RIGHT ANTECUBITAL  Final   Special Requests   Final    BOTTLES DRAWN AEROBIC AND ANAEROBIC Blood Culture adequate volume   Culture   Final    NO GROWTH 5 DAYS Performed at [facility], [address], [place], [place] [address]    Report Status 01/25/2020 FINAL  Final  SARS CORONAVIRUS 2 (TAT 6-24 HRS) Nasopharyngeal Nasopharyngeal Swab     Status: None   Collection Time: 01/20/20  3:37 PM   Specimen: Nasopharyngeal Swab  Result Value Ref Range Status   SARS Coronavirus 2 NEGATIVE NEGATIVE Final    Comment: (NOTE) SARS-CoV-2 target nucleic acids are NOT DETECTED. The SARS-CoV-2 RNA is generally detectable in upper and lower respiratory specimens during the acute phase of infection. Negative results do not preclude SARS-CoV-2 infection, do not rule out co-infections with other pathogens, and should not be used as the sole basis for treatment or other patient management decisions. Negative results must be combined with clinical observations, patient history, and epidemiological information. The expected result is Negative. Fact Sheet for Patients: SugarRoll.be Fact Sheet for Healthcare Providers: https://www.woods-mathews.com/ This test is not yet approved or cleared by the [place] FDA and  has been authorized for detection and/or diagnosis of SARS-CoV-2 by FDA under an Emergency Use Authorization (EUA). This EUA will remain  in effect (meaning this test can be used) for the duration of the COVID-19 declaration under Section 56 4(b)(1) of the Act, 21 U.S.C. section 360bbb-3(b)(1), unless the authorization is terminated or revoked sooner. Performed at [facility], [place] [address]  [place], [place] [address]   MRSA PCR Screening     Status: None   Collection Time: 01/21/20  5:25 AM   Specimen: Nasal Mucosa;  Nasopharyngeal  Result Value Ref Range Status   MRSA by PCR NEGATIVE NEGATIVE Final    Comment:        The GeneXpert MRSA Assay (FDA approved for NASAL specimens only), is one component of a comprehensive MRSA colonization surveillance program. It is not intended to diagnose MRSA infection nor to guide or monitor treatment for MRSA infections. Performed at [facility], [place] [address], [place], [place] [address]   Surgical pcr screen     Status: Abnormal   Collection Time: 01/21/20  3:24 PM   Specimen: Nasal Mucosa; Nasal Swab  Result Value Ref Range Status   MRSA, PCR NEGATIVE NEGATIVE Final   Staphylococcus aureus POSITIVE (A) NEGATIVE Final    Comment: (NOTE) The Xpert SA Assay (FDA approved for NASAL specimens in patients 71 years of age and older), is one component of a comprehensive surveillance program. It is not intended to diagnose infection nor to guide or monitor treatment. Performed at [facility], [place] [address], [place], [place] [address]   Surgical pcr screen     Status: Abnormal   Collection Time: 01/22/20  3:46 AM   Specimen: Nasal Mucosa; Nasal Swab  Result Value Ref Range Status   MRSA, PCR NEGATIVE NEGATIVE Final   Staphylococcus aureus POSITIVE (A) NEGATIVE Final    Comment: CRITICAL RESULT CALLED TO, READ BACK BY AND VERIFIED WITH: RN DAVID Mamie Nick [id number] @0610  THANEY Performed at [facility], [place] [address], [place], [place] [address]   Fungus Culture With Stain     Status: None (Preliminary result)   Collection Time: 01/22/20  3:25 PM   Specimen: PATH Bone biopsy; Tissue  Result Value Ref Range Status   Fungus Stain Final report  Final    Comment: (NOTE) Performed At: [facility] [place], [place] [id number] Rush Farmer MD [id number]    Fungus (Mycology) Culture PENDING   Incomplete   Fungal Source TISSUE  Final    Comment: LUMBAR 1 Performed at [facility], [place] [address], [place], [place] [address]   Aerobic/Anaerobic Culture (surgical/deep wound)     Status: None   Collection Time: 01/22/20  3:25 PM   Specimen: PATH Bone biopsy; Tissue  Result Value Ref Range Status   Specimen Description TISSUE  Final   Special Requests LUMBAR 1  Final   Gram Stain NO WBC SEEN RARE GRAM POSITIVE COCCI   Final   Culture   Final    RARE PSEUDOMONAS AERUGINOSA NO ANAEROBES ISOLATED Performed at [facility], [address] [place], [place] [address]    Report Status 01/27/2020 FINAL  Final   Organism ID, Bacteria PSEUDOMONAS AERUGINOSA  Final      Susceptibility   Pseudomonas aeruginosa - MIC*    CEFTAZIDIME 4 SENSITIVE Sensitive     CIPROFLOXACIN <=0.25 SENSITIVE Sensitive     GENTAMICIN <=1 SENSITIVE Sensitive     IMIPENEM 1 SENSITIVE Sensitive     PIP/TAZO 8 SENSITIVE Sensitive     CEFEPIME 2 SENSITIVE Sensitive     * RARE PSEUDOMONAS AERUGINOSA  Acid Fast Smear (AFB)     Status: None   Collection Time: 01/22/20  3:25 PM   Specimen: PATH Bone biopsy; Tissue  Result Value Ref Range Status   AFB Specimen Processing Comment  Final    Comment: Tissue Grinding and Digestion/Decontamination   Acid Fast Smear Negative  Final    Comment: (NOTE) Performed At: [place] [address]  [place], [place] [id number] Rush Farmer MD XF:[id number]    Source (AFB) TISSUE  Final    Comment: LUMBAR 1 Performed at [facility], [place] [name] [place], [place] [address]   Fungus Culture Result     Status: None   Collection Time: 01/22/20  3:25 PM  Result Value Ref Range Status   Result 1 Comment  Final    Comment: (NOTE) KOH/Calcofluor preparation:  no fungus observed. Performed At: [facility] [place], [place] [id number] Rush Farmer MD [id number]   Fungus Culture With Stain     Status: None  (Preliminary result)   Collection Time: 01/22/20  5:06 PM   Specimen: PATH Bone biopsy; Tissue  Result Value Ref Range Status   Fungus Stain Final report  Final    Comment: (NOTE) Performed At: [facility] [place], [place] [id number] Rush Farmer MD QM:[id number]    Fungus (Mycology) Culture PENDING  Incomplete   Fungal Source 2  Final    Comment: TISSUE LUMBAR 1 Performed at [facility], [place] [address] [place], [place] [address]   Aerobic/Anaerobic Culture (surgical/deep wound)     Status: None   Collection Time: 01/22/20  5:06 PM   Specimen: PATH Bone biopsy; Tissue  Result Value Ref Range Status   Specimen Description TISSUE  Final   Special Requests 2 LUMBAR 1  Final   Gram Stain NO WBC SEEN NO ORGANISMS SEEN   Final   Culture   Final    RARE PSEUDOMONAS AERUGINOSA CRITICAL RESULT CALLED TO, READ BACK BY AND VERIFIED WITH: RN J [id number] FCP NO ANAEROBES ISOLATED Performed at [facility], [place] [address] [place], [place] [address]    Report Status 01/27/2020 FINAL  Final   Organism ID, Bacteria PSEUDOMONAS AERUGINOSA  Final      Susceptibility   Pseudomonas aeruginosa - MIC*    CEFTAZIDIME 4 SENSITIVE Sensitive     CIPROFLOXACIN 0.5 SENSITIVE Sensitive     GENTAMICIN <=1 SENSITIVE Sensitive     IMIPENEM 1 SENSITIVE Sensitive     PIP/TAZO 8 SENSITIVE Sensitive     CEFEPIME 2 SENSITIVE Sensitive     * RARE PSEUDOMONAS AERUGINOSA  Acid Fast Smear (AFB)     Status: None   Collection Time: 01/22/20  5:06 PM   Specimen: PATH Bone biopsy; Tissue  Result Value Ref Range Status   AFB Specimen Processing Comment  Final    Comment: Tissue Grinding and Digestion/Decontamination   Acid Fast Smear Negative  Final    Comment: (NOTE) Performed At: [facility] [place], [place] [id number] Rush Farmer MD OX:[id number]    Source (AFB) 2  Final    Comment: TISSUE LUMBAR 1 Performed at [facility], [place] [address], [place], [place] [address]   Fungus Culture Result     Status: None   Collection Time: 01/22/20  5:06 PM  Result Value Ref Range Status   Result 1 Comment  Final    Comment: (NOTE) KOH/Calcofluor preparation:  no fungus observed. Performed At: [facility] [place], [place] [id number] Rush Farmer MD XQ:[id number]      Radiology Studies: No results found.  Scheduled Meds: . amLODipine  5 mg Oral Daily  . atorvastatin  10 mg Oral Daily  . Chlorhexidine Gluconate Cloth  6 each Topical Daily  . docusate sodium  100 mg Oral BID  . DULoxetine  60 mg  Oral Daily  . enoxaparin (LOVENOX) injection  40 mg Subcutaneous Q24H  . feeding supplement (GLUCERNA SHAKE)  237 mL Oral TID BM  . insulin aspart  0-9 Units Subcutaneous TID WC  . insulin glargine  20 Units Subcutaneous Daily  . insulin glargine  30 Units Subcutaneous Q2200  . lidocaine  1 patch Transdermal Q24H  . multivitamin with minerals  1 tablet Oral Daily  . pregabalin  150 mg Oral BID  . senna  1 tablet Oral BID  . sodium chloride flush  3 mL Intravenous Q12H   Continuous Infusions: . 0.9 % NaCl with KCl 20 mEq / L Stopped (01/24/20 2016)  . ceFEPime (MAXIPIME) IV Stopped (01/27/20 1550)     LOS: 7 days   Marylu Lund, MD [facility] Pager On Amion  If 7PM-7AM, please contact night-coverage 01/27/2020, 4:46 PM

## 2020-01-27 NOTE — Progress Notes (Signed)
Occupational Therapy Treatment Patient Details Name: [name] [name] MRN: [id number] DOB: Jun 04, 1956 Today's Date: 01/27/2020    History of present illness [name] [name] is a 64 y.o. adult with PMH signficant for diabetes mellitus type 2, chronic kidney disease stage III,  anemia, HTN, anxiety, GERD, fibromyalgia, depression, PTSD, and is legally blind. She was admitted for L1 osteomyelitis and spinal instability. S/p debridement and stabilization T11-12 & L2-3 (3/25)    OT comments  Pt limited this session by blood pressure, noting significant drop in BP with standing, pt also with reports of dizziness and nausea. Serial BP taken, see below and doc flowsheets. RN notified. Pt required minA+2 for rolling in bed to don back brace. She required modA+2 to progress to sitting EOB. Educated pt on significance of BP and impact of BP drop. Pt is more open to CIR level therapies to progress toward goals and return home with her husband and dog. Updated d/c recommendation to CIR to maximize pt's independence with mobility and ADL. Will continue to follow acutely.    01/27/20 0945 01/27/20 0948 01/27/20 0950  Vital Signs  Patient Position (if appropriate) Orthostatic Vitals  --   --   Orthostatic Lying   BP- Lying  --   --   --   Pulse- Lying  --   --   --   Orthostatic Sitting  BP- Sitting 110/60 (!) 80/61 130/65 (Reclined near supine)  Pulse- Sitting  --  87 81  Orthostatic Standing at 0 minutes  BP- Standing at 0 minutes (!) 82/47  --   --   Pulse- Standing at 0 minutes 117  --   --     01/27/20 1000 01/27/20 1001 01/27/20 1003  Vital Signs  Patient Position (if appropriate)  --   --   --   Orthostatic Lying   BP- Lying  --  109/67 138/68  Pulse- Lying  --  80 72  Orthostatic Sitting  BP- Sitting 100/65  --   --   Pulse- Sitting 81  --   --   Orthostatic Standing at 0 minutes  BP- Standing at 0 minutes  --   --   --   Pulse- Standing at 0 minutes         Follow Up  Recommendations  Supervision/Assistance - 24 hour;CIR    Equipment Recommendations  None recommended by OT    Recommendations for Other Services      Precautions / Restrictions Precautions Precautions: Back;Fall Precaution Booklet Issued: Yes (comment) Precaution Comments: Pt instructed in back precautions  Required Braces or Orthoses: Spinal Brace Spinal Brace: Thoracolumbosacral orthotic;Applied in supine position Restrictions Weight Bearing Restrictions: No Other Position/Activity Restrictions: TLSO donned in supine, must be worn when HOB >30deg       Mobility Bed Mobility Overal bed mobility: Needs Assistance Bed Mobility: Rolling;Sidelying to Sit;Sit to Supine Rolling: Min assist;+2 for safety/equipment Sidelying to sit: Mod assist;+2 for physical assistance;+2 for safety/equipment       General bed mobility comments: Pt able to assist with rolling. Mod A +2 for rolling to maintain precautions and donning of TLSO in supine. Min A to sit EOB- assist to get trunk upright and scoot anteriorly. Pt requires inc time and explanation of planned movements; she was able to describe an overview of how to don her brace to OT  Transfers Overall transfer level: Needs assistance Equipment used: Rolling walker (2 wheeled) Transfers: Sit to/from Stand Sit to Stand: Min assist;+2 safety/equipment  General transfer comment: Cues for hand placement and safety and min assist to steady; Good rise form slightly elevated bed; cues to self-monitor for activity tolerance    Balance Overall balance assessment: Needs assistance Sitting-balance support: Bilateral upper extremity supported;Feet supported Sitting balance-Leahy Scale: Fair Sitting balance - Comments: Pt able to sit without UE support if requested. Prefers B UE support. Requires VC to avoid posterior lean     Standing balance-Leahy Scale: Poor                             ADL either performed or assessed  with clinical judgement   ADL Overall ADL's : Needs assistance/impaired Eating/Feeding: Sitting;Bed level;Set up                   Lower Body Dressing: Total assistance Lower Body Dressing Details (indicate cue type and reason): totalA to don socks Toilet Transfer: Moderate assistance;Stand-pivot Toilet Transfer Details (indicate cue type and reason): simulated to/from recliner         Functional mobility during ADLs: Moderate assistance;+2 for physical assistance;+2 for safety/equipment;Rolling walker General ADL Comments: limited by BP     Vision       Perception     Praxis      Cognition Arousal/Alertness: Awake/alert Behavior During Therapy: Anxious;WFL for tasks assessed/performed Overall Cognitive Status: Within Functional Limits for tasks assessed                                          Exercises     Shoulder Instructions       General Comments     Pertinent Vitals/ Pain       Pain Assessment: 0-10 Pain Score: 9  Faces Pain Scale: Hurts even more Pain Location: lumbar region Pain Descriptors / Indicators: Aching;Discomfort;Operative site guarding;Sore Pain Intervention(s): Limited activity within patient's tolerance;Monitored during session  Home Living                                          Prior Functioning/Environment              Frequency  Min 2X/week        Progress Toward Goals  OT Goals(current goals can now be found in the care plan section)  Progress towards OT goals: Progressing toward goals  Acute Rehab OT Goals Patient Stated Goal: to have less pain  OT Goal Formulation: With patient Time For Goal Achievement: 02/06/20 Potential to Achieve Goals: Good ADL Goals Pt Will Perform Grooming: with set-up;sitting Pt Will Perform Upper Body Bathing: with min assist;sitting Pt Will Transfer to Toilet: with mod assist;stand pivot transfer;bedside commode Pt Will Perform Toileting -  Clothing Manipulation and hygiene: with max assist;sit to/from stand  Plan Discharge plan remains appropriate    Co-evaluation    PT/OT/SLP Co-Evaluation/Treatment: Yes Reason for Co-Treatment: For patient/therapist safety;To address functional/ADL transfers PT goals addressed during session: Mobility/safety with mobility OT goals addressed during session: ADL's and self-care      AM-PAC OT "6 Clicks" Daily Activity     Outcome Measure   Help from another person eating meals?: A Lot Help from another person taking care of personal grooming?: A Lot Help from another person toileting, which includes using toliet, bedpan,  or urinal?: Total Help from another person bathing (including washing, rinsing, drying)?: Total Help from another person to put on and taking off regular upper body clothing?: Total Help from another person to put on and taking off regular lower body clothing?: Total 6 Click Score: 8    End of Session Equipment Utilized During Treatment: Gait belt;Rolling walker;Back brace  OT Visit Diagnosis: Unsteadiness on feet (R26.81);Pain;Low vision, both eyes (H54.2) Pain - part of body: (back)   Activity Tolerance Treatment limited secondary to medical complications (Comment)(pt limited by BP)   Patient Left in bed;with call bell/phone within reach;with bed alarm set;with SCD's reapplied   Nurse Communication Mobility status        Time: 4784-1282 OT Time Calculation (min): 39 min  Charges: OT General Charges $OT Visit: 1 Visit OT Treatments $Self Care/Home Management : 23-37 mins  Helene Kelp OTR/L Acute Rehabilitation Services Office: [place] 01/27/2020, 1:20 PM

## 2020-01-27 NOTE — Progress Notes (Signed)
Rehab Admissions Coordinator Note:  Per PT request, patient was screened by Bethel Born for appropriateness for an Inpatient Acute Rehab Consult.  At this time, we are recommending Inpatient Rehab consult.  I will place an order per our protocol.  Linnaea Ahn Danford Bad 01/27/2020, 12:26 PM  I can be reached at [phone].

## 2020-01-28 LAB — COMPREHENSIVE METABOLIC PANEL
ALT: 10 U/L (ref 0–44)
AST: 14 U/L — ABNORMAL LOW (ref 15–41)
Albumin: 1.7 g/dL — ABNORMAL LOW (ref 3.5–5.0)
Alkaline Phosphatase: 63 U/L (ref 38–126)
Anion gap: 8 (ref 5–15)
BUN: 13 mg/dL (ref 8–23)
CO2: 24 mmol/L (ref 22–32)
Calcium: 8 mg/dL — ABNORMAL LOW (ref 8.9–10.3)
Chloride: 106 mmol/L (ref 98–111)
Creatinine, Ser: 1.28 mg/dL — ABNORMAL HIGH (ref 0.44–1.00)
GFR calc Af Amer: 51 mL/min — ABNORMAL LOW (ref >60)
GFR calc non Af Amer: 44 mL/min — ABNORMAL LOW (ref >60)
Glucose, Bld: 138 mg/dL — ABNORMAL HIGH (ref 70–99)
Potassium: 3.6 mmol/L (ref 3.5–5.1)
Sodium: 138 mmol/L (ref 135–145)
Total Bilirubin: 0.3 mg/dL (ref 0.3–1.2)
Total Protein: 6.1 g/dL — ABNORMAL LOW (ref 6.5–8.1)

## 2020-01-28 LAB — CBC
HCT: 25 % — ABNORMAL LOW (ref 36.0–46.0)
Hemoglobin: 7.5 g/dL — ABNORMAL LOW (ref 12.0–15.0)
MCH: 25.4 pg — ABNORMAL LOW (ref 26.0–34.0)
MCHC: 30 g/dL (ref 30.0–36.0)
MCV: 84.7 fL (ref 80.0–100.0)
Platelets: 190 10*3/uL (ref 150–400)
RBC: 2.95 MIL/uL — ABNORMAL LOW (ref 3.87–5.11)
RDW: 15.2 % (ref 11.5–15.5)
WBC: 5.3 10*3/uL (ref 4.0–10.5)
nRBC: 0 % (ref 0.0–0.2)

## 2020-01-28 LAB — GLUCOSE, CAPILLARY
Glucose-Capillary: 183 mg/dL — ABNORMAL HIGH (ref 70–99)
Glucose-Capillary: 154 mg/dL — ABNORMAL HIGH (ref 70–99)
Glucose-Capillary: 162 mg/dL — ABNORMAL HIGH (ref 70–99)
Glucose-Capillary: 204 mg/dL — ABNORMAL HIGH (ref 70–99)

## 2020-01-28 LAB — MAGNESIUM: Magnesium: 1.9 mg/dL (ref 1.7–2.4)

## 2020-01-28 NOTE — Progress Notes (Signed)
Neurosurgery Service Progress Note  Subjective: No acute events overnight, stable post-op pain, expected pain w/ L HF movement given trans-psoas approach, no new complaints today  Objective: Vitals:   01/27/20 1424 01/27/20 1949 01/28/20 0424 01/28/20 0736  BP: (!) 126/49 (!) 144/75 (!) 126/56 (!) 168/77  Pulse: 82 79 83 75  Resp: 15 16 20 16   Temp: (!) 97.4 F (36.3 C) 98.4 F (36.9 C) 97.8 F (36.6 C) 98.4 F (36.9 C)  TempSrc: Oral Oral Oral Oral  SpO2: 96% 97% 93% 96%  Weight:      Height:       Temp (24hrs), Avg:98 F (36.7 C), Min:97.4 F (36.3 C), Max:98.4 F (36.9 C)  CBC Latest Ref Rng & Units 01/28/2020 01/27/2020 01/26/2020  WBC 4.0 - 10.5 K/uL 5.3 5.8 6.3  Hemoglobin 12.0 - 15.0 g/dL 7.5(L) 7.4(L) 7.8(L)  Hematocrit 36.0 - 46.0 % 25.0(L) 24.3(L) 25.1(L)  Platelets 150 - 400 K/uL 190 191 186   BMP Latest Ref Rng & Units 01/28/2020 01/27/2020 01/26/2020  Glucose 70 - 99 mg/dL 138(H) 208(H) 93  BUN 8 - 23 mg/dL 13 14 14   Creatinine 0.44 - 1.00 mg/dL 1.28(H) 1.29(H) 1.33(H)  BUN/Creat Ratio 6 - 22 (calc) - - -  Sodium 135 - 145 mmol/L 138 138 138  Potassium 3.5 - 5.1 mmol/L 3.6 3.7 3.8  Chloride 98 - 111 mmol/L 106 103 105  CO2 22 - 32 mmol/L 24 26 25   Calcium 8.9 - 10.3 mg/dL 8.0(L) 8.5(L) 8.6(L)    Intake/Output Summary (Last 24 hours) at 01/28/2020 0952 Last data filed at 01/28/2020 0600 Gross per 24 hour  Intake 1067.62 ml  Output --  Net 1067.62 ml    Current Facility-Administered Medications:  .  0.9 % NaCl with KCl 20 mEq/ L  infusion, , Intravenous, Continuous, Vallarie Mare, MD, Stopped at 01/24/20 2016 .  acetaminophen (TYLENOL) tablet 650 mg, 650 mg, Oral, Q6H PRN, Donne Hazel, MD, 650 mg at 01/24/20 2030 .  amLODipine (NORVASC) tablet 5 mg, 5 mg, Oral, Daily, Vallarie Mare, MD, 5 mg at 01/28/20 0909 .  atorvastatin (LIPITOR) tablet 10 mg, 10 mg, Oral, Daily, Vallarie Mare, MD, 10 mg at 01/28/20 0909 .  bisacodyl (DULCOLAX)  suppository 10 mg, 10 mg, Rectal, Daily PRN, Vallarie Mare, MD .  ceFEPIme (MAXIPIME) 2 g in sodium chloride 0.9 % 100 mL IVPB, 2 g, Intravenous, Q12H, Alvira Philips, [place], Last Rate: 200 mL/hr at 01/28/20 0929, 2 g at 01/28/20 0929 .  Chlorhexidine Gluconate Cloth 2 % PADS 6 each, 6 each, Topical, Daily, Donne Hazel, MD, 6 each at 01/27/20 [phone]  diazepam (VALIUM) tablet 5 mg, 5 mg, Oral, Q6H PRN, Vallarie Mare, MD, 5 mg at 01/23/20 1749 .  docusate sodium (COLACE) capsule 100 mg, 100 mg, Oral, BID, Vallarie Mare, MD, 100 mg at 01/28/20 7858 .  DULoxetine (CYMBALTA) DR capsule 60 mg, 60 mg, Oral, Daily, Vallarie Mare, MD, 60 mg at 01/28/20 0909 .  enoxaparin (LOVENOX) injection 40 mg, 40 mg, Subcutaneous, Q24H, Vallarie Mare, MD, 40 mg at 01/28/20 0905 .  feeding supplement (GLUCERNA SHAKE) (GLUCERNA SHAKE) liquid 237 mL, 237 mL, Oral, TID BM, Vallarie Mare, MD, 237 mL at 01/28/20 8502 .  hydrALAZINE (APRESOLINE) injection 10 mg, 10 mg, Intravenous, Q6H PRN, Vallarie Mare, MD .  HYDROmorphone (DILAUDID) injection 0.5 mg, 0.5 mg, Intravenous, Q2H PRN, Vallarie Mare, MD, 0.5 mg at 01/28/20  [phone] .  insulin aspart (novoLOG) injection 0-9 Units, 0-9 Units, Subcutaneous, TID WC, Vallarie Mare, MD, 2 Units at 01/28/20 [phone] .  insulin glargine (LANTUS) injection 20 Units, 20 Units, Subcutaneous, Daily, Vallarie Mare, MD, 20 Units at 01/28/20 [phone] .  insulin glargine (LANTUS) injection 30 Units, 30 Units, Subcutaneous, Q2200, Vallarie Mare, MD, 30 Units at 01/27/20 2120 .  lidocaine (LIDODERM) 5 % 1 patch, 1 patch, Transdermal, Q24H, Vallarie Mare, MD, 1 patch at 01/28/20 0043 .  menthol-cetylpyridinium (CEPACOL) lozenge 3 mg, 1 lozenge, Oral, PRN **OR** phenol (CHLORASEPTIC) mouth spray 1 spray, 1 spray, Mouth/Throat, PRN, Vallarie Mare, MD .  multivitamin with minerals tablet 1 tablet, 1 tablet, Oral, Daily, Vallarie Mare, MD, 1  tablet at 01/28/20 0908 .  ondansetron (ZOFRAN) tablet 4 mg, 4 mg, Oral, Q6H PRN **OR** ondansetron (ZOFRAN) injection 4 mg, 4 mg, Intravenous, Q6H PRN, Vallarie Mare, MD .  oxyCODONE (Oxy IR/ROXICODONE) immediate release tablet 10 mg, 10 mg, Oral, Q3H PRN, Vallarie Mare, MD, 10 mg at 01/28/20 0453 .  oxyCODONE (Oxy IR/ROXICODONE) immediate release tablet 5 mg, 5 mg, Oral, Q3H PRN, Vallarie Mare, MD, 5 mg at 01/23/20 1528 .  polyethylene glycol (MIRALAX / GLYCOLAX) packet 17 g, 17 g, Oral, Daily PRN, Vallarie Mare, MD, 17 g at 01/25/20 1616 .  pregabalin (LYRICA) capsule 150 mg, 150 mg, Oral, BID, Vallarie Mare, MD, 150 mg at 01/28/20 0909 .  senna (SENOKOT) tablet 8.6 mg, 1 tablet, Oral, BID, Vallarie Mare, MD, 8.6 mg at 01/28/20 0909 .  sodium chloride flush (NS) 0.9 % injection 10-40 mL, 10-40 mL, Intracatheter, PRN, Donne Hazel, MD .  sodium chloride flush (NS) 0.9 % injection 3 mL, 3 mL, Intravenous, Q12H, Vallarie Mare, MD, 3 mL at 01/28/20 0910 .  sodium chloride flush (NS) 0.9 % injection 3 mL, 3 mL, Intravenous, PRN, Vallarie Mare, MD .  sodium phosphate (FLEET) 7-19 GM/118ML enema 1 enema, 1 enema, Rectal, Once PRN, Vallarie Mare, MD   Physical Exam: AOx3, PERRL, EOMI, FS, Strength 5/5 x4 except pain limited 4+/5 in L hip flexors, SILTx4, incisions c/d/i  Assessment & Plan: 64 y.o. woman s/p anterolateral corpectomy with PSIF for osteomyelitis, recovering well.  -okay for DVT chemoprophylaxis as needed, activity as tolerated, brace when out of bed. No change in neurosurgical plan of care, pt recovering as expected without any concerns, will sign off. But our service is available to re-evaluate the patient and answer any questions/concerns should they arise. -pt should follow up with Dr. Marcello Moores in 2 weeks, will place discharge instructions in Epic for wound care / follow up / etc.  Judith Part  01/28/20 9:52 AM

## 2020-01-28 NOTE — Progress Notes (Signed)
Inpatient Rehabilitation-Admissions Coordinator   Pt and her husband want to proceed with CIR if her insurance approves. Awaiting insurance determination for possible CIR admit.   If approved today, I do not have a bed available for this patient today. Will follow up tomorrow, pending insurance approval and bed availability.   Raechel Ache, OTR/L  Rehab Admissions Coordinator  [phone] 01/28/2020 11:18 AM

## 2020-01-28 NOTE — Progress Notes (Signed)
PROGRESS NOTE    [name] [name]  DZH:[id number] DOB: 09-22-1956 DOA: 01/20/2020 PCP: Ann Held, DO    Brief Narrative:  [phone] with hx DM2, CKD stage 3, hx recent septic elbow with recent L1 compression fracture. Pt presents with worsened back pain, found to have L1 osteo. Neurosurgery and ID consulted  Assessment & Plan:   Principal Problem:   Osteomyelitis of vertebra of thoracolumbar region [facility]) Active Problems:   Essential hypertension   Hepatic cirrhosis (HCC)   CAD (coronary artery disease)   CKD (chronic kidney disease), stage III (HCC)   Ulcer of left heel, limited to breakdown of skin ([place])   Type 2 diabetes mellitus with hyperglycemia, with long-term current use of insulin (HCC)   Septic arthritis of elbow, right (HCC)   Paraspinal mass   Spinal stenosis of lumbar region  Osteomyelitis of vertebra of thoracolumbar region [facility]), L1 lumbar discitis with paraspinal fluid enhancement:  -Neurosurgery consulted, pt underwent surgery 3/25 -ID following, pseudomonas growing in tissue cx -Appreciate ID input. Recommendation for Cefepime through 03/05/20 to complete 6 weeks of tx -Plan for CIR  Diabetes mellitus type 2, uncontrolled with hyperglycemia -Will continue with BID Lantus dosing with sliding scale coverage -Glucose trends remains stable at present -Hemoglobin A1c 12.5  CKD (chronic kidney disease), stage IIIb (HCC) -Baseline creatinine 1.5-1.6 -Renal function remains stable    Essential hypertension./Orthostatic hypotension -BP somewhat elevated initially so she was placed on Norvasc 5 mg daily however now she is orthostatic positive so I will discontinue amlodipine and watch for now and will also order TED hose. -continue with hydralazine IV as tolerated    Hepatic cirrhosis (HCC) -No acute issues at present    CAD (coronary artery disease) -Currently no chest pain or any anginal symptoms -Remains stable at this time  Possible OSA  -Patient describes heavy snoring and observed apnea while sleeping prior to admit -Continue trial of CPAP at night while in hospital -Recommend outpatient sleep study after discharge.     DVT prophylaxis: Lovenox subq Code Status: Full Family Communication: Pt in room, no family at bedside Disposition Plan: From home, plan to discharge to CIR when insurance authorization and bed available, likely tomorrow.  Consultants:   Neurosurgery  ID  Procedures:   L1 corpectomy and reconstruction with expandable cage, T11-L3 percutaneous posterior instrumentation 3/25  Antimicrobials: Anti-infectives (From admission, onward)   Start     Dose/Rate Route Frequency Ordered Stop   01/26/20 2200  ceFEPIme (MAXIPIME) 2 g in sodium chloride 0.9 % 100 mL IVPB     2 g 200 mL/hr over 30 Minutes Intravenous Every 12 hours 01/26/20 1149     01/26/20 1800  ceFEPIme (MAXIPIME) 2 g in sodium chloride 0.9 % 100 mL IVPB  Status:  Discontinued     2 g 200 mL/hr over 30 Minutes Intravenous Every 8 hours 01/26/20 1144 01/26/20 1149   01/24/20 1030  ceFEPIme (MAXIPIME) 2 g in sodium chloride 0.9 % 100 mL IVPB  Status:  Discontinued     2 g 200 mL/hr over 30 Minutes Intravenous Every 12 hours 01/24/20 1012 01/26/20 1144   01/24/20 1000  ceFEPIme (MAXIPIME) 2 g in sodium chloride 0.9 % 100 mL IVPB  Status:  Discontinued     2 g 200 mL/hr over 30 Minutes Intravenous Every 8 hours 01/24/20 0939 01/24/20 1012   01/23/20 2200  vancomycin (VANCOREADY) IVPB 500 mg/100 mL  Status:  Discontinued     500 mg 100 mL/hr over  60 Minutes Intravenous Every 24 hours 01/22/20 2200 01/24/20 1540   01/23/20 1430  rifampin (RIFADIN) capsule 600 mg  Status:  Discontinued     600 mg Oral Daily 01/23/20 1339 01/24/20 1540   01/23/20 1000  Levofloxacin (LEVAQUIN) IVPB 250 mg  Status:  Discontinued     250 mg 50 mL/hr over 60 Minutes Intravenous Every 24 hours 01/22/20 2141 01/23/20 1339   01/22/20 2300  vancomycin (VANCOCIN) IVPB  1000 mg/200 mL premix     1,000 mg 200 mL/hr over 60 Minutes Intravenous To Post Anesthesia Care Unit 01/22/20 2200 01/23/20 2300   01/22/20 1505  bacitracin 50,000 Units in sodium chloride 0.9 % 500 mL irrigation  Status:  Discontinued       As needed 01/22/20 1505 01/22/20 2020   01/22/20 0800  vancomycin (VANCOCIN) IVPB 1000 mg/200 mL premix     1,000 mg 200 mL/hr over 60 Minutes Intravenous To ShortStay Surgical 01/22/20 0330 01/22/20 1435      Subjective: Patient seen and examined.  She just finished working with PT.  PT technician was there as well.  Patient complained of shaking while working with PT but denied any dizziness, chest pain or shortness of breath or any other complaint.  Looked tired.  Objective: Vitals:   01/27/20 1949 01/28/20 0424 01/28/20 0736 01/28/20 1241  BP: (!) 144/75 (!) 126/56 (!) 168/77 121/65  Pulse: 79 83 75 85  Resp: 16 20 16 18   Temp: 98.4 F (36.9 C) 97.8 F (36.6 C) 98.4 F (36.9 C) 98.3 F (36.8 C)  TempSrc: Oral Oral Oral Oral  SpO2: 97% 93% 96% 100%  Weight:      Height:        Intake/Output Summary (Last 24 hours) at 01/28/2020 1454 Last data filed at 01/28/2020 1305 Gross per 24 hour  Intake 1307.62 ml  Output -  Net 1307.62 ml   Filed Weights   01/20/20 1051 01/20/20 2300  Weight: 81.6 kg 81.7 kg    Examination:  General exam: Appears calm and comfortable , TLSO brace in place Respiratory system: Clear to auscultation. Respiratory effort normal. Cardiovascular system: S1 & S2 heard, RRR. No JVD, murmurs, rubs, gallops or clicks. No pedal edema. Gastrointestinal system: Abdomen is nondistended, soft and nontender. No organomegaly or masses felt. Normal bowel sounds heard. Central nervous system: Alert and oriented. No focal neurological deficits. Extremities: Symmetric 5 x 5 power. Skin: No rashes, lesions or ulcers.  Psychiatry: Judgement and insight appear normal. Mood & affect appropriate.    Data Reviewed: I have  personally reviewed following labs and imaging studies  CBC: Recent Labs  Lab 01/24/20 0253 01/25/20 0435 01/26/20 0551 01/27/20 0434 01/28/20 0356  WBC 8.8 7.0 6.3 5.8 5.3  HGB 7.5* 7.2* 7.8* 7.4* 7.5*  HCT 25.0* 23.7* 25.1* 24.3* 25.0*  MCV 85.9 86.2 82.6 85.0 84.7  PLT 172 159 186 191 664   Basic Metabolic Panel: Recent Labs  Lab 01/24/20 0253 01/25/20 0435 01/26/20 0551 01/27/20 0434 01/28/20 0356  NA 138 136 138 138 138  K 4.9 4.1 3.8 3.7 3.6  CL 107 105 105 103 106  CO2 24 24 25 26 24   GLUCOSE 186* 154* 93 208* 138*  BUN 23 18 14 14 13   CREATININE 1.65* 1.42* 1.33* 1.29* 1.28*  CALCIUM 8.4* 8.2* 8.6* 8.5* 8.0*  MG  --   --   --   --  1.9   GFR: Estimated Creatinine Clearance (by C-G formula based on SCr of  1.28 mg/dL (H)) Female: 43 mL/min (A) Female: 52.9 mL/min (A) Liver Function Tests: Recent Labs  Lab 01/22/20 0534 01/26/20 0551 01/27/20 0434 01/28/20 0356  AST 10* 13* 12* 14*  ALT 6 11 9 10   ALKPHOS 90 69 68 63  BILITOT 0.7 0.7 0.6 0.3  PROT 6.8 6.1* 5.9* 6.1*  ALBUMIN 2.0* 1.7* 1.6* 1.7*   No results for input(s): LIPASE, AMYLASE in the last 168 hours. No results for input(s): AMMONIA in the last 168 hours. Coagulation Profile: Recent Labs  Lab 01/22/20 0534  INR 1.2   Cardiac Enzymes: No results for input(s): CKTOTAL, CKMB, CKMBINDEX, TROPONINI in the last 168 hours. BNP (last 3 results) No results for input(s): PROBNP in the last 8760 hours. HbA1C: No results for input(s): HGBA1C in the last 72 hours. CBG: Recent Labs  Lab 01/27/20 1131 01/27/20 1646 01/27/20 2107 01/28/20 0646 01/28/20 1143  GLUCAP 128* 118* 147* 183* 154*   Lipid Profile: No results for input(s): CHOL, HDL, LDLCALC, TRIG, CHOLHDL, LDLDIRECT in the last 72 hours. Thyroid Function Tests: No results for input(s): TSH, T4TOTAL, FREET4, T3FREE, THYROIDAB in the last 72 hours. Anemia Panel: No results for input(s): VITAMINB12, FOLATE, FERRITIN, TIBC, IRON,  RETICCTPCT in the last 72 hours. Sepsis Labs: No results for input(s): PROCALCITON, LATICACIDVEN in the last 168 hours.  Recent Results (from the past 240 hour(s))  Blood culture (routine x 2)     Status: None   Collection Time: 01/20/20  1:34 PM   Specimen: BLOOD RIGHT HAND  Result Value Ref Range Status   Specimen Description BLOOD RIGHT HAND  Final   Special Requests   Final    BOTTLES DRAWN AEROBIC ONLY Blood Culture results may not be optimal due to an inadequate volume of blood received in culture bottles   Culture   Final    NO GROWTH 5 DAYS Performed at [facility], [place] [address], [place], [place] [address]    Report Status 01/25/2020 FINAL  Final  Blood culture (routine x 2)     Status: None   Collection Time: 01/20/20  3:28 PM   Specimen: BLOOD  Result Value Ref Range Status   Specimen Description BLOOD RIGHT ANTECUBITAL  Final   Special Requests   Final    BOTTLES DRAWN AEROBIC AND ANAEROBIC Blood Culture adequate volume   Culture   Final    NO GROWTH 5 DAYS Performed at [facility], [address], [place], [place] [address]    Report Status 01/25/2020 FINAL  Final  SARS CORONAVIRUS 2 (TAT 6-24 HRS) Nasopharyngeal Nasopharyngeal Swab     Status: None   Collection Time: 01/20/20  3:37 PM   Specimen: Nasopharyngeal Swab  Result Value Ref Range Status   SARS Coronavirus 2 NEGATIVE NEGATIVE Final    Comment: (NOTE) SARS-CoV-2 target nucleic acids are NOT DETECTED. The SARS-CoV-2 RNA is generally detectable in upper and lower respiratory specimens during the acute phase of infection. Negative results do not preclude SARS-CoV-2 infection, do not rule out co-infections with other pathogens, and should not be used as the sole basis for treatment or other patient management decisions. Negative results must be combined with clinical observations, patient history, and epidemiological information. The expected result is Negative. Fact Sheet for Patients:  SugarRoll.be Fact Sheet for Healthcare Providers: https://www.woods-mathews.com/ This test is not yet approved or cleared by the [place] FDA and  has been authorized for detection and/or diagnosis of SARS-CoV-2 by FDA under an Emergency Use  Authorization (EUA). This EUA will remain  in effect (meaning this test can be used) for the duration of the COVID-19 declaration under Section 56 4(b)(1) of the Act, 21 U.S.C. section 360bbb-3(b)(1), unless the authorization is terminated or revoked sooner. Performed at [facility], [place] [address] [place], [place] [address]   MRSA PCR Screening     Status: None   Collection Time: 01/21/20  5:25 AM   Specimen: Nasal Mucosa; Nasopharyngeal  Result Value Ref Range Status   MRSA by PCR NEGATIVE NEGATIVE Final    Comment:        The GeneXpert MRSA Assay (FDA approved for NASAL specimens only), is one component of a comprehensive MRSA colonization surveillance program. It is not intended to diagnose MRSA infection nor to guide or monitor treatment for MRSA infections. Performed at [facility], [place] [address] [place], [place] [address]   Surgical pcr screen     Status: Abnormal   Collection Time: 01/21/20  3:24 PM   Specimen: Nasal Mucosa; Nasal Swab  Result Value Ref Range Status   MRSA, PCR NEGATIVE NEGATIVE Final   Staphylococcus aureus POSITIVE (A) NEGATIVE Final    Comment: (NOTE) The Xpert SA Assay (FDA approved for NASAL specimens in patients 75 years of age and older), is one component of a comprehensive surveillance program. It is not intended to diagnose infection nor to guide or monitor treatment. Performed at [facility], [place] [address] [place], [place] [address]   Surgical pcr screen     Status: Abnormal   Collection Time: 01/22/20  3:46 AM   Specimen: Nasal Mucosa; Nasal Swab  Result Value Ref Range Status   MRSA, PCR NEGATIVE NEGATIVE Final    Staphylococcus aureus POSITIVE (A) NEGATIVE Final    Comment: CRITICAL RESULT CALLED TO, READ BACK BY AND VERIFIED WITH: RN DAVID Mamie Nick [id number] @0610  THANEY Performed at [facility], [place] [address], [place], [place] [address]   Fungus Culture With Stain     Status: None (Preliminary result)   Collection Time: 01/22/20  3:25 PM   Specimen: PATH Bone biopsy; Tissue  Result Value Ref Range Status   Fungus Stain Final report  Final    Comment: (NOTE) Performed At: [facility] [place], [place] [id number] Rush Farmer MD [id number]    Fungus (Mycology) Culture PENDING  Incomplete   Fungal Source TISSUE  Final    Comment: LUMBAR 1 Performed at [facility], [place] [address] [place], [place] [address]   Aerobic/Anaerobic Culture (surgical/deep wound)     Status: None   Collection Time: 01/22/20  3:25 PM   Specimen: PATH Bone biopsy; Tissue  Result Value Ref Range Status   Specimen Description TISSUE  Final   Special Requests LUMBAR 1  Final   Gram Stain NO WBC SEEN RARE GRAM POSITIVE COCCI   Final   Culture   Final    RARE PSEUDOMONAS AERUGINOSA NO ANAEROBES ISOLATED Performed at [facility], [address], [place], [place] [address]    Report Status 01/27/2020 FINAL  Final   Organism ID, Bacteria PSEUDOMONAS AERUGINOSA  Final      Susceptibility   Pseudomonas aeruginosa - MIC*    CEFTAZIDIME 4 SENSITIVE Sensitive     CIPROFLOXACIN <=0.25 SENSITIVE Sensitive     GENTAMICIN <=1 SENSITIVE Sensitive     IMIPENEM 1 SENSITIVE Sensitive     PIP/TAZO 8 SENSITIVE Sensitive     CEFEPIME  2 SENSITIVE Sensitive     * RARE PSEUDOMONAS AERUGINOSA  Acid Fast Smear (AFB)     Status: None   Collection Time: 01/22/20  3:25 PM   Specimen: PATH Bone biopsy; Tissue  Result Value Ref Range Status   AFB Specimen Processing Comment  Final    Comment: Tissue Grinding and Digestion/Decontamination   Acid Fast Smear Negative  Final    Comment: (NOTE)  Performed At: [facility] [place], [place] [id number] Rush Farmer MD HC:[id number]    Source (AFB) TISSUE  Final    Comment: LUMBAR 1 Performed at [facility], [place] [address], [place], [place] [address]   Fungus Culture Result     Status: None   Collection Time: 01/22/20  3:25 PM  Result Value Ref Range Status   Result 1 Comment  Final    Comment: (NOTE) KOH/Calcofluor preparation:  no fungus observed. Performed At: [facility] [place], [place] [id number] Rush Farmer MD [id number]   Fungus Culture With Stain     Status: None (Preliminary result)   Collection Time: 01/22/20  5:06 PM   Specimen: PATH Bone biopsy; Tissue  Result Value Ref Range Status   Fungus Stain Final report  Final    Comment: (NOTE) Performed At: [facility] [place], [place] [id number] Rush Farmer MD [id number]    Fungus (Mycology) Culture PENDING  Incomplete   Fungal Source 2  Final    Comment: TISSUE LUMBAR 1 Performed at [facility], [place] [address], [place], [place] [address]   Aerobic/Anaerobic Culture (surgical/deep wound)     Status: None   Collection Time: 01/22/20  5:06 PM   Specimen: PATH Bone biopsy; Tissue  Result Value Ref Range Status   Specimen Description TISSUE  Final   Special Requests 2 LUMBAR 1  Final   Gram Stain NO WBC SEEN NO ORGANISMS SEEN   Final   Culture   Final    RARE PSEUDOMONAS AERUGINOSA CRITICAL RESULT CALLED TO, READ BACK BY AND VERIFIED WITH: RN J [id number] FCP NO ANAEROBES ISOLATED Performed at [facility], [place] [address] [place], [place] [address]    Report Status 01/27/2020 FINAL  Final   Organism ID, Bacteria PSEUDOMONAS AERUGINOSA  Final      Susceptibility   Pseudomonas aeruginosa - MIC*    CEFTAZIDIME 4 SENSITIVE Sensitive     CIPROFLOXACIN 0.5 SENSITIVE Sensitive     GENTAMICIN <=1 SENSITIVE Sensitive     IMIPENEM 1 SENSITIVE Sensitive      PIP/TAZO 8 SENSITIVE Sensitive     CEFEPIME 2 SENSITIVE Sensitive     * RARE PSEUDOMONAS AERUGINOSA  Acid Fast Smear (AFB)     Status: None   Collection Time: 01/22/20  5:06 PM   Specimen: PATH Bone biopsy; Tissue  Result Value Ref Range Status   AFB Specimen Processing Comment  Final    Comment: Tissue Grinding and Digestion/Decontamination   Acid Fast Smear Negative  Final    Comment: (NOTE) Performed At: [facility] [place], [place] [id number] Rush Farmer MD [id number]    Source (AFB) 2  Final    Comment: TISSUE LUMBAR 1 Performed at [facility], [place] [address] [place], [place] [address]   Fungus Culture Result     Status: None   Collection Time: 01/22/20  5:06 PM  Result Value Ref Range Status   Result 1 Comment  Final  Comment: (NOTE) KOH/Calcofluor preparation:  no fungus observed. Performed At: [facility] [place], [place] [id number] Rush Farmer MD GE:[id number]      Radiology Studies: No results found.  Scheduled Meds: . atorvastatin  10 mg Oral Daily  . Chlorhexidine Gluconate Cloth  6 each Topical Daily  . docusate sodium  100 mg Oral BID  . DULoxetine  60 mg Oral Daily  . enoxaparin (LOVENOX) injection  40 mg Subcutaneous Q24H  . feeding supplement (GLUCERNA SHAKE)  237 mL Oral TID BM  . insulin aspart  0-9 Units Subcutaneous TID WC  . insulin glargine  20 Units Subcutaneous Daily  . insulin glargine  30 Units Subcutaneous Q2200  . lidocaine  1 patch Transdermal Q24H  . multivitamin with minerals  1 tablet Oral Daily  . pregabalin  150 mg Oral BID  . senna  1 tablet Oral BID  . sodium chloride flush  3 mL Intravenous Q12H   Continuous Infusions: . 0.9 % NaCl with KCl 20 mEq / L Stopped (01/24/20 2016)  . ceFEPime (MAXIPIME) IV 2 g (01/28/20 0929)     LOS: 8 days   Darliss Cheney, MD [facility] Pager On Amion  If 7PM-7AM, please contact night-coverage 01/28/2020, 2:54  PM

## 2020-01-28 NOTE — Plan of Care (Signed)
  Problem: Clinical Measurements: Goal: Will remain free from infection Outcome: Progressing  Patient afebrile this shift.  Problem: Activity: Goal: Risk for activity intolerance will decrease Outcome: Progressing  Patient denies SOB or fatigue when up with walker.  Problem: Nutrition: Goal: Adequate nutrition will be maintained Outcome: Progressing  Patient accepts glucerna between meals.  Problem: Pain Managment: Goal: General experience of comfort will improve Outcome: Progressing  Patient states pain well controlled with prn meds.  Problem: Safety: Goal: Ability to remain free from injury will improve Outcome: Progressing  Patient calls for assistance before getting up.

## 2020-01-28 NOTE — Progress Notes (Signed)
Physical Therapy Treatment Patient Details Name: [name] [name] MRN: [id number] DOB: 1956/01/13 Today's Date: 01/28/2020    History of Present Illness [name] [name] is a 64 y.o. adult with PMH signficant for diabetes mellitus type 2, chronic kidney disease stage III,  anemia, HTN, anxiety, GERD, fibromyalgia, depression, PTSD, and is legally blind. She was admitted for L1 osteomyelitis and spinal instability. S/p debridement and stabilization T11-12 & L2-3 (3/25)     PT Comments    Continuing work on functional mobility and activity tolerance;  Slow progress with activity tolerance; Ms. Pendry was able to stand longer than last session, however we were ultimately unable to obtain a standing BP Doroteo Bradford, RN assisted in trying auscultate a standing BP); Dr. Doristine Bosworth is aware of concerns of orthostasis; BPs as follows:     01/28/20 1030 01/28/20 1047 01/28/20 1059  Orthostatic Lying   BP- Lying 146/64  --   --   Pulse- Lying 77  --   --   Orthostatic Sitting  BP- Sitting 127/61 98/63 (after stnding approx 2-3) 97/62  Pulse- Sitting 84 87 82  Orthostatic Standing at 0 minutes  BP- Standing at 0 minutes  (Attempted Standing BP, unable to obtain)  --   --    Despite above, Ms. Glendenning still is participating well, and shows good understanding of donning and doffing brace, as well as what is needed for her to be able to safely dc home.   Follow Up Recommendations  CIR     Equipment Recommendations  Rolling walker with 5" wheels;3in1 (PT)    Recommendations for Other Services       Precautions / Restrictions Precautions Precautions: Back;Fall Precaution Comments: Pt instructed in back precautions, and to self-monitor for activity tolerance Required Braces or Orthoses: Spinal Brace Spinal Brace: Thoracolumbosacral orthotic;Applied in supine position Restrictions Other Position/Activity Restrictions: TLSO donned in supine, must be worn when HOB >30deg; watch for Orthostatics;  legally blind    Mobility  Bed Mobility Overal bed mobility: Needs Assistance Bed Mobility: Rolling;Sidelying to Sit Rolling: Min assist Sidelying to sit: Mod assist       General bed mobility comments: Min assist for rolling to maintain precautions and donning of TLSO in supine. Light mod assist to sit EOB- assist to get trunk upright and scoot anteriorly. Pt requires inc time and explanation of planned movements; she was able to describe an overview of how to don her brace to [employer] Overall transfer level: Needs assistance Equipment used: Rolling walker (2 wheeled) Transfers: Sit to/from Stand Sit to Stand: [employer] safety/equipment         General transfer comment: Cues for hand placement and safety and min assist to steady; Good rise form slightly elevated bed; cues to self-monitor for activity tolerance  Ambulation/Gait Ambulation/Gait assistance: Min assist;+2 safety/equipment Gait Distance (Feet): (pivotal steps bed to chair) Assistive device: Rolling walker (2 wheeled) Gait Pattern/deviations: Shuffle     General Gait Details: Did not push progressive ambulation due to dizziness with upright activity; limited step to just in front of recliner while we took standing BP, which showed quite a drop, so sat down straightaway   Stairs             Wheelchair Mobility    Modified Rankin (Stroke Patients Only)       Balance     Sitting balance-Leahy Scale: Fair       Standing balance-Leahy Scale: Poor  Cognition Arousal/Alertness: Awake/alert Behavior During Therapy: Anxious;WFL for tasks assessed/performed Overall Cognitive Status: Within Functional Limits for tasks assessed                                 General Comments: Cognition WFL for tasks assessed       Exercises      General Comments       Pertinent Vitals/Pain Pain Assessment: 0-10 Pain Score: 8   Pain Location: lumbar region Pain Descriptors / Indicators: Aching;Discomfort;Operative site guarding;Sore Pain Intervention(s): Monitored during session    Home Living                      Prior Function            PT Goals (current goals can now be found in the care plan section) Acute Rehab PT Goals Patient Stated Goal: to have less pain; is interested in rehab  PT Goal Formulation: With patient Time For Goal Achievement: 02/06/20 Potential to Achieve Goals: Fair Progress towards PT goals: Progressing toward goals    Frequency    Min 5X/week      PT Plan Current plan remains appropriate    Co-evaluation              AM-PAC PT "6 Clicks" Mobility   Outcome Measure  Help needed turning from your back to your side while in a flat bed without using bedrails?: A Little Help needed moving from lying on your back to sitting on the side of a flat bed without using bedrails?: A Lot Help needed moving to and from a bed to a chair (including a wheelchair)?: A Little Help needed standing up from a chair using your arms (e.g., wheelchair or bedside chair)?: A Little Help needed to walk in hospital room?: A Lot Help needed climbing 3-5 steps with a railing? : Total 6 Click Score: 14    End of Session Equipment Utilized During Treatment: Back brace Activity Tolerance: Other (comment)(limited by orthostatic hypotension) Patient left: in chair;with call bell/phone within reach;with chair alarm set Nurse Communication: Mobility status;Other (comment)(BP drop in standing, requesting TED hose) PT Visit Diagnosis: Muscle weakness (generalized) (M62.81);Other abnormalities of gait and mobility (R26.89);Pain Pain - Right/Left: (Lumbar spine) Pain - part of body: (Back)     Time: 0867-6195 PT Time Calculation (min) (ACUTE ONLY): 40 min  Charges:  $Therapeutic Activity: 38-52 mins                     Roney Marion, PT  Acute Rehabilitation Services Pager  [phone] Office [phone]    [place] 01/28/2020, 2:29 PM

## 2020-01-28 NOTE — Progress Notes (Signed)
Nutrition Follow-up  DOCUMENTATION CODES:   Obesity unspecified  INTERVENTION:   -Continue MVI with minerals daily -Continue Glucerna Shake po TID, each supplement provides 220 kcal and 10 grams of protein  NUTRITION DIAGNOSIS:   Increased nutrient needs related to post-op healing as evidenced by estimated needs.  Ongoing  GOAL:   Patient will meet greater than or equal to 90% of their needs  Progressing   MONITOR:   PO intake, Supplement acceptance, Labs, Weight trends, Skin, I & O's  REASON FOR ASSESSMENT:   Malnutrition Screening Tool    ASSESSMENT:   [name] [name] is a 64 y.o. adult with history of diabetes mellitus type 2, chronic kidney disease stage III anemia who was admitted in January 2021 for septic arthritis of the elbow at the time patient also had a L1 compression fracture has been having increasing pain in the lower over the last 2 weeks which has progressively worsened to the point that patient is not ambulating at this time.  Denies any weakness of the extremities or incontinence of urine or bowel.  Patient has not moved bowels for last 2 weeks.  Denies vomiting.  Has been eating her food but decreased intake.  Has been using all her medications including insulin.  3/25- s/p PROCEDURE: 1. L1 corpectomy via left lateral retroperitoneal and retropleural approach using [employer] lateral retractor 2. Reconstruction with expandable titanium cage 3.Percutaneous segmental instrumentation with pedicle screw fixation at T11, T12, L2, and L3 and interconnecting rods 4. Stereotactic guidance of pedicle screw placement using [employer] Mazor robot 5.  Harvest of local autograft from 12th rib 6. Use of morselized allograft  Reviewed I/O's: +1.3 L x 24 hours and +9.8 L since admission  Pt receiving nursing care and working with PT at times of attempted visits.   Pt with good appetite; noted meal completion 75-100%. She is consuming Glucerna shakes.   Per CIR  admissions team note, possible discharge to CIR tomorrow (01/29/20) pending insurance approval and bed availability.   Labs reviewed: CBGS: 118-183 (inpatient orders for glycemic control are 0-9 units insulin aspart TID with meals, 20 units insulin glargine daily, and 30 units insulin glargine daily).   Diet Order:   Diet Order            Diet heart healthy/carb modified Room service appropriate? Yes; Fluid consistency: Thin  Diet effective now              EDUCATION NEEDS:   No education needs have been identified at this time  Skin:  Skin Assessment: Skin Integrity Issues: Skin Integrity Issues:: Other (Comment), Diabetic Ulcer Diabetic Ulcer: lt heel Other: MASD breasts  Last BM:  01/21/20  Height:   Ht Readings from Last 1 Encounters:  01/20/20 5' 1"  (1.549 m)    Weight:   Wt Readings from Last 1 Encounters:  01/20/20 81.7 kg    Ideal Body Weight:  47.7 kg  BMI:  Body mass index is 34.02 kg/m.  Estimated Nutritional Needs:   Kcal:  1700-1900  Protein:  95-110 grams  Fluid:  > 1.7 L    Loistine Chance, RD, LDN, [place] Registered Dietitian II Certified Diabetes Care and Education Specialist Please refer to [facility] for RD and/or RD on-call/weekend/after hours pager

## 2020-01-28 NOTE — Discharge Instructions (Addendum)
Neurosurgery discharge instructions Discharge Instructions  No restriction in activities, slowly increase your activity back to normal. Wear your back brace whenever you are out of bed and walking around. You do not have to wear it when bathing / showering.  Okay to shower on the day of discharge. Use regular soap and water and try to be gentle when cleaning your incision. No need for a dressing on your incision. If your incision begins to itch, rub some bacitracin or neosporin ointment on it instead of scratching it.  Follow up with Dr. Marcello Moores from Neurosurgery 2 weeks after discharge. If you do not already have a discharge appointment, please call his office at [phone] to schedule a follow up appointment. If you have any concerns or questions, please call the office and let us know.   Local Endocrinologists [facility] [phone]) 1. Dr. Philemon Kingdom 2. Dr. Janie Morning [facility] [phone]) 1. Dr. Delrae Rend [facility] [phone]) 1. Dr. Jacelyn Pi 2. Dr. Anda Kraft [facility] [phone]) 1. Dr. Daneil Dolin [facility] [phone]) [[place] office]  [phone]) [[place] office] 1. Dr. Lenna Sciara [place] 2. Dr. Mee Hives [facility]) [phone]) 1. Autumn Hudnall Ronnald Ramp), PA 2. Dr. Amalia Greenhouse 3. Dr. Marsh Dolly. [facility] [phone]) 1. Dr. Glade Lloyd [facility] [phone]) 1. Dr. Orville Govern 2. Dr. Lelon Huh 3. Dr. Jerelene Redden 4. Alwyn Ren, FNP Dr. Carolynn Serve. [facility] in [place] [phone])   Hemoglobin A1c Test Why am I having this test? You may have the hemoglobin A1c test (HbA1c test) done to:  Evaluate your risk for developing diabetes (diabetes mellitus).  Diagnose diabetes.  Monitor long-term control of blood sugar (glucose) in  people who have diabetes and help make treatment decisions. This test may be done with other blood glucose tests, such as fasting blood glucose and oral glucose tolerance tests. What is being tested? Hemoglobin is a type of protein in the blood that carries oxygen. Glucose attaches to hemoglobin to form glycated hemoglobin. This test checks the amount of glycated hemoglobin in your blood, which is a good indicator of the average amount of glucose in your blood during the past 2-3 months. What kind of sample is taken?  A blood sample is required for this test. It is usually collected by inserting a needle into a blood vessel. Tell a health care provider about:  All medicines you are taking, including vitamins, herbs, eye drops, creams, and over-the-counter medicines.  Any blood disorders you have.  Any surgeries you have had.  Any medical conditions you have.  Whether you are pregnant or may be pregnant. How are the results reported? Your results will be reported as a percentage that indicates how much of your hemoglobin has glucose attached to it (is glycated). Your health care provider will compare your results to normal ranges that were established after testing a large group of people (reference ranges). Reference ranges may vary among labs and hospitals. For this test, common reference ranges are:  Adult or child without diabetes: 4-5.6%.  Adult or child with diabetes and good blood glucose control: less than 7%. What do the results mean? If you have diabetes:  A result of less than 7% is considered normal, meaning that your blood glucose is well controlled.  A result higher than 7% means that your blood glucose is not well controlled, and your treatment plan may need to be adjusted. If you  do not have diabetes:  A result within the reference range is considered normal, meaning that you are not at high risk for diabetes.  A result of 5.7-6.4% means that you have a high risk of  developing diabetes, and you may have prediabetes. Prediabetes is the condition of having a blood glucose level that is higher than it should be, but not high enough for you to be diagnosed with diabetes. Having prediabetes puts you at risk for developing type 2 diabetes (type 2 diabetes mellitus). You may have more tests, including a repeat HbA1c test.  Results of 6.5% or higher on two separate HbA1c tests mean that you have diabetes. You may have more tests to confirm the diagnosis. Abnormally low HbA1c values may be caused by:  Pregnancy.  Severe blood loss.  Receiving donated blood (transfusions).  Low red blood cell count (anemia).  Long-term kidney failure.  Some unusual forms (variants) of hemoglobin. Talk with your health care provider about what your results mean. Questions to ask your health care provider Ask your health care provider, or the department that is doing the test:  When will my results be ready?  How will I get my results?  What are my treatment options?  What other tests do I need?  What are my next steps? Summary  The hemoglobin A1c test (HbA1c test) may be done to evaluate your risk for developing diabetes, to diagnose diabetes, and to monitor long-term control of blood sugar (glucose) in people who have diabetes and help make treatment decisions.  Hemoglobin is a type of protein in the blood that carries oxygen. Glucose attaches to hemoglobin to form glycated hemoglobin. This test checks the amount of glycated hemoglobin in your blood, which is a good indicator of the average amount of glucose in your blood during the past 2-3 months.  Talk with your health care provider about what your results mean. This information is not intended to replace advice given to you by your health care provider. Make sure you discuss any questions you have with your health care provider. Document Revised: 09/28/2017 Document Reviewed: 05/29/2017 Elsevier Patient Education   [place]. Hyperglycemia Hyperglycemia occurs when the level of sugar (glucose) in the blood is too high. Glucose is a type of sugar that provides the body's main source of energy. Certain hormones (insulin and glucagon) control the level of glucose in the blood. Insulin lowers blood glucose, and glucagon increases blood glucose. Hyperglycemia can result from having too little insulin in the bloodstream, or from the body not responding normally to insulin. Hyperglycemia occurs most often in people who have diabetes (diabetes mellitus), but it can happen in people who do not have diabetes. It can develop quickly, and it can be life-threatening if it causes you to become severely dehydrated (diabetic ketoacidosis or hyperglycemic hyperosmolar state). Severe hyperglycemia is a medical emergency. What are the causes? If you have diabetes, hyperglycemia may be caused by:  Diabetes medicine.  Medicines that increase blood glucose or affect your diabetes control.  Not eating enough, or not eating often enough.  Changes in physical activity level.  Being sick or having an infection. If you have prediabetes or undiagnosed diabetes:  Hyperglycemia may be caused by those conditions. If you do not have diabetes, hyperglycemia may be caused by:  Certain medicines, including steroid medicines, beta-blockers, epinephrine, and thiazide diuretics.  Stress.  Serious illness.  Surgery.  Diseases of the pancreas.  Infection. What increases the risk? Hyperglycemia is more likely  to develop in people who have risk factors for diabetes, such as:  Having a family member with diabetes.  Having a gene for type 1 diabetes that is passed from parent to child (inherited).  Living in an area with cold weather conditions.  Exposure to certain viruses.  Certain conditions in which the body's disease-fighting (immune) system attacks itself (autoimmune disorders).  Being overweight or  obese.  Having an inactive (sedentary) lifestyle.  Having been diagnosed with insulin resistance.  Having a history of prediabetes, gestational diabetes, or polycystic ovarian syndrome (PCOS).  Being of American-Indian, African-American, Hispanic/Latino, or Asian/Pacific Islander descent. What are the signs or symptoms? Hyperglycemia may not cause any symptoms. If you do have symptoms, they may include early warning signs, such as:  Increased thirst.  Hunger.  Feeling very tired.  Needing to urinate more often than usual.  Blurry vision. Other symptoms may develop if hyperglycemia gets worse, such as:  Dry mouth.  Loss of appetite.  Fruity-smelling breath.  Weakness.  Unexpected or rapid weight gain or weight loss.  Tingling or numbness in the hands or feet.  Headache.  Skin that does not quickly return to normal after being lightly pinched and released (poor skin turgor).  Abdominal pain.  Cuts or bruises that are slow to heal. How is this diagnosed? Hyperglycemia is diagnosed with a blood test to measure your blood glucose level. This blood test is usually done while you are having symptoms. Your health care provider may also do a physical exam and review your medical history. You may have more tests to determine the cause of your hyperglycemia, such as:  A fasting blood glucose (FBG) test. You will not be allowed to eat (you will fast) for at least 8 hours before a blood sample is taken.  An A1c (hemoglobin A1c) blood test. This provides information about blood glucose control over the previous 2-3 months.  An oral glucose tolerance test (OGTT). This measures your blood glucose at two times: ? After fasting. This is your baseline blood glucose level. ? Two hours after drinking a beverage that contains glucose. How is this treated? Treatment depends on the cause of your hyperglycemia. Treatment may include:  Taking medicine to regulate your blood glucose  levels. If you take insulin or other diabetes medicines, your medicine or dosage may be adjusted.  Lifestyle changes, such as exercising more, eating healthier foods, or losing weight.  Treating an illness or infection, if this caused your hyperglycemia.  Checking your blood glucose more often.  Stopping or reducing steroid medicines, if these caused your hyperglycemia. If your hyperglycemia becomes severe and it results in hyperglycemic hyperosmolar state, you must be hospitalized and given IV fluids. Follow these instructions at home:  General instructions  Take over-the-counter and prescription medicines only as told by your health care provider.  Do not use any products that contain nicotine or tobacco, such as cigarettes and e-cigarettes. If you need help quitting, ask your health care provider.  Limit alcohol intake to no more than 1 drink per day for nonpregnant women and 2 drinks per day for men. One drink equals 12 oz of beer, 5 oz of wine, or 1 oz of hard liquor.  Learn to manage stress. If you need help with this, ask your health care provider.  Keep all follow-up visits as told by your health care provider. This is important. Eating and drinking   Maintain a healthy weight.  Exercise regularly, as directed by your health care provider.  Stay hydrated, especially when you exercise, get sick, or spend time in hot temperatures.  Eat healthy foods, such as: ? Lean proteins. ? Complex carbohydrates. ? Fresh fruits and vegetables. ? Low-fat dairy products. ? Healthy fats.  Drink enough fluid to keep your urine clear or pale yellow. If you have diabetes:  Make sure you know the symptoms of hyperglycemia.  Follow your diabetes management plan, as told by your health care provider. Make sure you: ? Take your insulin and medicines as directed. ? Follow your exercise plan. ? Follow your meal plan. Eat on time, and do not skip meals. ? Check your blood glucose as  often as directed. Make sure to check your blood glucose before and after exercise. If you exercise longer or in a different way than usual, check your blood glucose more often. ? Follow your sick day plan whenever you cannot eat or drink normally. Make this plan in advance with your health care provider.  Share your diabetes management plan with people in your workplace, school, and household.  Check your urine for ketones when you are ill and as told by your health care provider.  Carry a medical alert card or wear medical alert jewelry. Contact a health care provider if:  Your blood glucose is at or above 240 mg/dL (13.3 mmol/L) for 2 days in a row.  You have problems keeping your blood glucose in your target range.  You have frequent episodes of hyperglycemia. Get help right away if:  You have difficulty breathing.  You have a change in how you think, feel, or act (mental status).  You have nausea or vomiting that does not go away. These symptoms may represent a serious problem that is an emergency. Do not wait to see if the symptoms will go away. Get medical help right away. Call your local emergency services (911 in the [place] Do not drive yourself to the hospital. Summary  Hyperglycemia occurs when the level of sugar (glucose) in the blood is too high.  Hyperglycemia is diagnosed with a blood test to measure your blood glucose level. This blood test is usually done while you are having symptoms. Your health care provider may also do a physical exam and review your medical history.  If you have diabetes, follow your diabetes management plan as told by your health care provider.  Contact your health care provider if you have problems keeping your blood glucose in your target range. This information is not intended to replace advice given to you by your health care provider. Make sure you discuss any questions you have with your health care provider. Document Revised: 07/03/2016  Document Reviewed: 07/03/2016 Elsevier Patient Education  [place].

## 2020-01-28 NOTE — Progress Notes (Signed)
Pt continues to refuse CPAP. Order discontinued per RT protocol. Advised pt to notify for RT if she changes her mind.

## 2020-01-29 ENCOUNTER — Other Ambulatory Visit: Payer: Self-pay

## 2020-01-29 ENCOUNTER — Inpatient Hospital Stay (HOSPITAL_COMMUNITY)
Admission: RE | Admit: 2020-01-29 | Discharge: 2020-02-18 | DRG: 560 | Disposition: A | Discharge status: Other Acute Inpatient Hospital | Payer: PRIVATE HEALTH INSURANCE | Source: Intra-hospital | Attending: Physical Medicine and Rehabilitation | Admitting: Physical Medicine and Rehabilitation

## 2020-01-29 ENCOUNTER — Encounter (HOSPITAL_COMMUNITY): Payer: Self-pay | Admitting: Physical Medicine and Rehabilitation

## 2020-01-29 DIAGNOSIS — I129 Hypertensive chronic kidney disease with stage 1 through stage 4 chronic kidney disease, or unspecified chronic kidney disease: Secondary | ICD-10-CM | POA: Diagnosis present

## 2020-01-29 DIAGNOSIS — R197 Diarrhea, unspecified: Secondary | ICD-10-CM | POA: Diagnosis not present

## 2020-01-29 DIAGNOSIS — N179 Acute kidney failure, unspecified: Secondary | ICD-10-CM | POA: Diagnosis present

## 2020-01-29 DIAGNOSIS — D696 Thrombocytopenia, unspecified: Secondary | ICD-10-CM | POA: Diagnosis not present

## 2020-01-29 DIAGNOSIS — K5900 Constipation, unspecified: Secondary | ICD-10-CM | POA: Diagnosis present

## 2020-01-29 DIAGNOSIS — Z90710 Acquired absence of both cervix and uterus: Secondary | ICD-10-CM

## 2020-01-29 DIAGNOSIS — K7030 Alcoholic cirrhosis of liver without ascites: Secondary | ICD-10-CM | POA: Diagnosis not present

## 2020-01-29 DIAGNOSIS — J45909 Unspecified asthma, uncomplicated: Secondary | ICD-10-CM | POA: Diagnosis present

## 2020-01-29 DIAGNOSIS — L299 Pruritus, unspecified: Secondary | ICD-10-CM | POA: Diagnosis not present

## 2020-01-29 DIAGNOSIS — H548 Legal blindness, as defined in USA: Secondary | ICD-10-CM | POA: Diagnosis present

## 2020-01-29 DIAGNOSIS — Z6834 Body mass index (BMI) 34.0-34.9, adult: Secondary | ICD-10-CM

## 2020-01-29 DIAGNOSIS — I82441 Acute embolism and thrombosis of right tibial vein: Secondary | ICD-10-CM | POA: Diagnosis not present

## 2020-01-29 DIAGNOSIS — G4700 Insomnia, unspecified: Secondary | ICD-10-CM | POA: Diagnosis not present

## 2020-01-29 DIAGNOSIS — K219 Gastro-esophageal reflux disease without esophagitis: Secondary | ICD-10-CM | POA: Diagnosis present

## 2020-01-29 DIAGNOSIS — D649 Anemia, unspecified: Secondary | ICD-10-CM | POA: Diagnosis not present

## 2020-01-29 DIAGNOSIS — Z940 Kidney transplant status: Secondary | ICD-10-CM | POA: Diagnosis not present

## 2020-01-29 DIAGNOSIS — Z79899 Other long term (current) drug therapy: Secondary | ICD-10-CM | POA: Diagnosis not present

## 2020-01-29 DIAGNOSIS — R0600 Dyspnea, unspecified: Secondary | ICD-10-CM

## 2020-01-29 DIAGNOSIS — M8608 Acute hematogenous osteomyelitis, other sites: Secondary | ICD-10-CM | POA: Diagnosis not present

## 2020-01-29 DIAGNOSIS — E1165 Type 2 diabetes mellitus with hyperglycemia: Secondary | ICD-10-CM | POA: Diagnosis not present

## 2020-01-29 DIAGNOSIS — L97421 Non-pressure chronic ulcer of left heel and midfoot limited to breakdown of skin: Secondary | ICD-10-CM | POA: Diagnosis present

## 2020-01-29 DIAGNOSIS — N3289 Other specified disorders of bladder: Secondary | ICD-10-CM | POA: Diagnosis present

## 2020-01-29 DIAGNOSIS — L97429 Non-pressure chronic ulcer of left heel and midfoot with unspecified severity: Secondary | ICD-10-CM | POA: Diagnosis present

## 2020-01-29 DIAGNOSIS — E1122 Type 2 diabetes mellitus with diabetic chronic kidney disease: Secondary | ICD-10-CM | POA: Diagnosis present

## 2020-01-29 DIAGNOSIS — D6959 Other secondary thrombocytopenia: Secondary | ICD-10-CM | POA: Diagnosis not present

## 2020-01-29 DIAGNOSIS — M797 Fibromyalgia: Secondary | ICD-10-CM | POA: Diagnosis present

## 2020-01-29 DIAGNOSIS — I82451 Acute embolism and thrombosis of right peroneal vein: Secondary | ICD-10-CM | POA: Diagnosis not present

## 2020-01-29 DIAGNOSIS — I824Y3 Acute embolism and thrombosis of unspecified deep veins of proximal lower extremity, bilateral: Secondary | ICD-10-CM | POA: Diagnosis not present

## 2020-01-29 DIAGNOSIS — Z7901 Long term (current) use of anticoagulants: Secondary | ICD-10-CM

## 2020-01-29 DIAGNOSIS — I82442 Acute embolism and thrombosis of left tibial vein: Secondary | ICD-10-CM | POA: Diagnosis not present

## 2020-01-29 DIAGNOSIS — E8770 Fluid overload, unspecified: Secondary | ICD-10-CM | POA: Diagnosis not present

## 2020-01-29 DIAGNOSIS — Z90721 Acquired absence of ovaries, unilateral: Secondary | ICD-10-CM

## 2020-01-29 DIAGNOSIS — Z4789 Encounter for other orthopedic aftercare: Principal | ICD-10-CM

## 2020-01-29 DIAGNOSIS — M4625 Osteomyelitis of vertebra, thoracolumbar region: Secondary | ICD-10-CM

## 2020-01-29 DIAGNOSIS — D500 Iron deficiency anemia secondary to blood loss (chronic): Secondary | ICD-10-CM | POA: Diagnosis present

## 2020-01-29 DIAGNOSIS — K922 Gastrointestinal hemorrhage, unspecified: Secondary | ICD-10-CM | POA: Diagnosis not present

## 2020-01-29 DIAGNOSIS — E669 Obesity, unspecified: Secondary | ICD-10-CM | POA: Diagnosis present

## 2020-01-29 DIAGNOSIS — I10 Essential (primary) hypertension: Secondary | ICD-10-CM | POA: Diagnosis not present

## 2020-01-29 DIAGNOSIS — K760 Fatty (change of) liver, not elsewhere classified: Secondary | ICD-10-CM | POA: Diagnosis present

## 2020-01-29 DIAGNOSIS — M869 Osteomyelitis, unspecified: Secondary | ICD-10-CM | POA: Diagnosis present

## 2020-01-29 DIAGNOSIS — M7989 Other specified soft tissue disorders: Secondary | ICD-10-CM | POA: Diagnosis not present

## 2020-01-29 DIAGNOSIS — M4626 Osteomyelitis of vertebra, lumbar region: Secondary | ICD-10-CM | POA: Diagnosis not present

## 2020-01-29 DIAGNOSIS — I824Y9 Acute embolism and thrombosis of unspecified deep veins of unspecified proximal lower extremity: Secondary | ICD-10-CM | POA: Diagnosis not present

## 2020-01-29 DIAGNOSIS — Z9049 Acquired absence of other specified parts of digestive tract: Secondary | ICD-10-CM

## 2020-01-29 DIAGNOSIS — E1151 Type 2 diabetes mellitus with diabetic peripheral angiopathy without gangrene: Secondary | ICD-10-CM | POA: Diagnosis present

## 2020-01-29 DIAGNOSIS — F329 Major depressive disorder, single episode, unspecified: Secondary | ICD-10-CM | POA: Diagnosis present

## 2020-01-29 DIAGNOSIS — G4733 Obstructive sleep apnea (adult) (pediatric): Secondary | ICD-10-CM | POA: Diagnosis present

## 2020-01-29 DIAGNOSIS — I82452 Acute embolism and thrombosis of left peroneal vein: Secondary | ICD-10-CM | POA: Diagnosis not present

## 2020-01-29 DIAGNOSIS — R339 Retention of urine, unspecified: Secondary | ICD-10-CM

## 2020-01-29 DIAGNOSIS — I252 Old myocardial infarction: Secondary | ICD-10-CM

## 2020-01-29 DIAGNOSIS — Z833 Family history of diabetes mellitus: Secondary | ICD-10-CM | POA: Diagnosis not present

## 2020-01-29 DIAGNOSIS — R338 Other retention of urine: Secondary | ICD-10-CM | POA: Diagnosis not present

## 2020-01-29 DIAGNOSIS — B965 Pseudomonas (aeruginosa) (mallei) (pseudomallei) as the cause of diseases classified elsewhere: Secondary | ICD-10-CM | POA: Diagnosis not present

## 2020-01-29 DIAGNOSIS — D631 Anemia in chronic kidney disease: Secondary | ICD-10-CM | POA: Diagnosis present

## 2020-01-29 DIAGNOSIS — E785 Hyperlipidemia, unspecified: Secondary | ICD-10-CM | POA: Diagnosis present

## 2020-01-29 DIAGNOSIS — I2510 Atherosclerotic heart disease of native coronary artery without angina pectoris: Secondary | ICD-10-CM | POA: Diagnosis present

## 2020-01-29 DIAGNOSIS — E1140 Type 2 diabetes mellitus with diabetic neuropathy, unspecified: Secondary | ICD-10-CM | POA: Diagnosis present

## 2020-01-29 DIAGNOSIS — Z794 Long term (current) use of insulin: Secondary | ICD-10-CM | POA: Diagnosis not present

## 2020-01-29 DIAGNOSIS — M549 Dorsalgia, unspecified: Secondary | ICD-10-CM | POA: Diagnosis not present

## 2020-01-29 DIAGNOSIS — F4310 Post-traumatic stress disorder, unspecified: Secondary | ICD-10-CM | POA: Diagnosis present

## 2020-01-29 DIAGNOSIS — E11649 Type 2 diabetes mellitus with hypoglycemia without coma: Secondary | ICD-10-CM | POA: Diagnosis not present

## 2020-01-29 DIAGNOSIS — F40240 Claustrophobia: Secondary | ICD-10-CM | POA: Diagnosis present

## 2020-01-29 DIAGNOSIS — G8929 Other chronic pain: Secondary | ICD-10-CM | POA: Diagnosis present

## 2020-01-29 DIAGNOSIS — M8628 Subacute osteomyelitis, other site: Secondary | ICD-10-CM | POA: Diagnosis not present

## 2020-01-29 DIAGNOSIS — M4646 Discitis, unspecified, lumbar region: Secondary | ICD-10-CM | POA: Diagnosis not present

## 2020-01-29 DIAGNOSIS — M62838 Other muscle spasm: Secondary | ICD-10-CM | POA: Diagnosis not present

## 2020-01-29 DIAGNOSIS — K7460 Unspecified cirrhosis of liver: Secondary | ICD-10-CM | POA: Diagnosis present

## 2020-01-29 DIAGNOSIS — K7581 Nonalcoholic steatohepatitis (NASH): Secondary | ICD-10-CM | POA: Diagnosis present

## 2020-01-29 DIAGNOSIS — M4620 Osteomyelitis of vertebra, site unspecified: Secondary | ICD-10-CM | POA: Diagnosis not present

## 2020-01-29 DIAGNOSIS — N1831 Chronic kidney disease, stage 3a: Secondary | ICD-10-CM | POA: Diagnosis present

## 2020-01-29 DIAGNOSIS — E11621 Type 2 diabetes mellitus with foot ulcer: Secondary | ICD-10-CM | POA: Diagnosis present

## 2020-01-29 DIAGNOSIS — E7800 Pure hypercholesterolemia, unspecified: Secondary | ICD-10-CM | POA: Diagnosis present

## 2020-01-29 DIAGNOSIS — M8600 Acute hematogenous osteomyelitis, unspecified site: Secondary | ICD-10-CM | POA: Diagnosis not present

## 2020-01-29 LAB — BASIC METABOLIC PANEL
Anion gap: 10 (ref 5–15)
BUN: 13 mg/dL (ref 8–23)
CO2: 25 mmol/L (ref 22–32)
Calcium: 7.9 mg/dL — ABNORMAL LOW (ref 8.9–10.3)
Chloride: 103 mmol/L (ref 98–111)
Creatinine, Ser: 1.24 mg/dL — ABNORMAL HIGH (ref 0.44–1.00)
GFR calc Af Amer: 53 mL/min — ABNORMAL LOW (ref >60)
GFR calc non Af Amer: 46 mL/min — ABNORMAL LOW (ref >60)
Glucose, Bld: 238 mg/dL — ABNORMAL HIGH (ref 70–99)
Potassium: 3.8 mmol/L (ref 3.5–5.1)
Sodium: 138 mmol/L (ref 135–145)

## 2020-01-29 LAB — GLUCOSE, CAPILLARY
Glucose-Capillary: 158 mg/dL — ABNORMAL HIGH (ref 70–99)
Glucose-Capillary: 228 mg/dL — ABNORMAL HIGH (ref 70–99)
Glucose-Capillary: 208 mg/dL — ABNORMAL HIGH (ref 70–99)
Glucose-Capillary: 258 mg/dL — ABNORMAL HIGH (ref 70–99)

## 2020-01-29 LAB — CBC
HCT: 23.7 % — ABNORMAL LOW (ref 36.0–46.0)
Hemoglobin: 7.2 g/dL — ABNORMAL LOW (ref 12.0–15.0)
MCH: 25.9 pg — ABNORMAL LOW (ref 26.0–34.0)
MCHC: 30.4 g/dL (ref 30.0–36.0)
MCV: 85.3 fL (ref 80.0–100.0)
Platelets: 205 10*3/uL (ref 150–400)
RBC: 2.78 MIL/uL — ABNORMAL LOW (ref 3.87–5.11)
RDW: 15.1 % (ref 11.5–15.5)
WBC: 6.5 10*3/uL (ref 4.0–10.5)
nRBC: 0 % (ref 0.0–0.2)

## 2020-01-29 LAB — HEMOGLOBIN AND HEMATOCRIT, BLOOD
HCT: 24.1 % — ABNORMAL LOW (ref 36.0–46.0)
Hemoglobin: 7.3 g/dL — ABNORMAL LOW (ref 12.0–15.0)

## 2020-01-29 MED ORDER — CEFEPIME IV (FOR PTA / DISCHARGE USE ONLY): 2.0000 g | Freq: Two times a day (BID) | INTRAVENOUS | 0 refills | Status: DC

## 2020-01-29 MED ORDER — ONDANSETRON HCL 4 MG/2ML IJ SOLN: 4.0000 mg | Freq: Four times a day (QID) | INTRAMUSCULAR | Status: DC | PRN

## 2020-01-29 MED ORDER — OXYCODONE HCL 5 MG PO TABS
10.0000 mg | ORAL_TABLET | ORAL | Status: DC | PRN
  Administered 2020-01-29 – 2020-02-14 (×35): 10 mg via ORAL
  Filled 2020-01-29 (×37): qty 2

## 2020-01-29 MED ORDER — DOCUSATE SODIUM 100 MG PO CAPS
100.0000 mg | ORAL_CAPSULE | Freq: Two times a day (BID) | ORAL | Status: DC
  Administered 2020-01-29 – 2020-01-30 (×2): 100 mg via ORAL
  Filled 2020-01-29 (×2): qty 1

## 2020-01-29 MED ORDER — POLYETHYLENE GLYCOL 3350 17 G PO PACK
17.0000 g | PACK | Freq: Every day | ORAL | Status: DC | PRN
  Administered 2020-02-11: 13:00:00 17 g via ORAL
  Filled 2020-01-29: qty 1

## 2020-01-29 MED ORDER — DULOXETINE HCL 60 MG PO CPEP
60.0000 mg | ORAL_CAPSULE | Freq: Every day | ORAL | Status: DC
  Administered 2020-01-30 – 2020-02-14 (×16): 60 mg via ORAL
  Filled 2020-01-29 (×16): qty 1

## 2020-01-29 MED ORDER — INSULIN GLARGINE 100 UNIT/ML ~~LOC~~ SOLN
30.0000 [IU] | Freq: Two times a day (BID) | SUBCUTANEOUS | Status: DC
  Administered 2020-01-29: 30 [IU] via SUBCUTANEOUS
  Filled 2020-01-29 (×3): qty 0.3

## 2020-01-29 MED ORDER — LIDOCAINE 5 % EX PTCH
1.0000 | MEDICATED_PATCH | CUTANEOUS | Status: DC
  Administered 2020-01-30 – 2020-02-18 (×17): 1 via TRANSDERMAL
  Filled 2020-01-29 (×19): qty 1

## 2020-01-29 MED ORDER — ADULT MULTIVITAMIN W/MINERALS CH
1.0000 | ORAL_TABLET | Freq: Every day | ORAL | Status: DC
  Administered 2020-01-30 – 2020-02-18 (×20): 1 via ORAL
  Filled 2020-01-29 (×20): qty 1

## 2020-01-29 MED ORDER — ATORVASTATIN CALCIUM 10 MG PO TABS
10.0000 mg | ORAL_TABLET | Freq: Every day | ORAL | Status: DC
  Administered 2020-01-30 – 2020-02-18 (×20): 10 mg via ORAL
  Filled 2020-01-29 (×20): qty 1

## 2020-01-29 MED ORDER — GLUCERNA SHAKE PO LIQD
237.0000 mL | Freq: Three times a day (TID) | ORAL | Status: DC
  Administered 2020-01-29 – 2020-02-17 (×49): 237 mL via ORAL

## 2020-01-29 MED ORDER — PREGABALIN 75 MG PO CAPS
150.0000 mg | ORAL_CAPSULE | Freq: Two times a day (BID) | ORAL | Status: DC
  Administered 2020-01-29 – 2020-02-11 (×26): 150 mg via ORAL
  Filled 2020-01-29 (×28): qty 2

## 2020-01-29 MED ORDER — SODIUM CHLORIDE 0.9 % IV SOLN
2.0000 g | Freq: Two times a day (BID) | INTRAVENOUS | Status: DC
  Administered 2020-01-29 – 2020-02-11 (×26): 2 g via INTRAVENOUS
  Filled 2020-01-29 (×27): qty 2

## 2020-01-29 MED ORDER — ENOXAPARIN SODIUM 40 MG/0.4ML ~~LOC~~ SOLN: 40.0000 mg | SUBCUTANEOUS | Status: DC

## 2020-01-29 MED ORDER — INSULIN GLARGINE 100 UNIT/ML ~~LOC~~ SOLN
30.0000 [IU] | Freq: Every day | SUBCUTANEOUS | Status: DC
  Filled 2020-01-29: qty 0.3

## 2020-01-29 MED ORDER — SENNA 8.6 MG PO TABS
1.0000 | ORAL_TABLET | Freq: Two times a day (BID) | ORAL | Status: DC
  Administered 2020-01-29 – 2020-01-31 (×4): 8.6 mg via ORAL
  Filled 2020-01-29 (×7): qty 1

## 2020-01-29 MED ORDER — OXYCODONE HCL 5 MG PO TABS
5.0000 mg | ORAL_TABLET | ORAL | Status: DC | PRN
  Administered 2020-01-30 – 2020-02-18 (×14): 5 mg via ORAL
  Filled 2020-01-29 (×14): qty 1

## 2020-01-29 MED ORDER — DIAZEPAM 2 MG PO TABS
5.0000 mg | ORAL_TABLET | Freq: Four times a day (QID) | ORAL | Status: DC | PRN
  Administered 2020-01-29 – 2020-02-02 (×5): 5 mg via ORAL
  Filled 2020-01-29 (×5): qty 3

## 2020-01-29 MED ORDER — ENOXAPARIN SODIUM 40 MG/0.4ML ~~LOC~~ SOLN
40.0000 mg | SUBCUTANEOUS | Status: DC
  Administered 2020-01-30: 40 mg via SUBCUTANEOUS
  Filled 2020-01-29: qty 0.4

## 2020-01-29 MED ORDER — ONDANSETRON HCL 4 MG PO TABS: 4.0000 mg | ORAL_TABLET | Freq: Four times a day (QID) | ORAL | Status: DC | PRN

## 2020-01-29 MED ORDER — INSULIN GLARGINE 100 UNIT/ML ~~LOC~~ SOLN
30.0000 [IU] | Freq: Two times a day (BID) | SUBCUTANEOUS | Status: DC
  Administered 2020-01-29 – 2020-01-31 (×4): 30 [IU] via SUBCUTANEOUS
  Filled 2020-01-29 (×6): qty 0.3

## 2020-01-29 MED ORDER — INSULIN ASPART 100 UNIT/ML ~~LOC~~ SOLN
0.0000 [IU] | Freq: Three times a day (TID) | SUBCUTANEOUS | Status: DC
  Administered 2020-01-30: 17:00:00 3 [IU] via SUBCUTANEOUS
  Administered 2020-01-30: 12:00:00 1 [IU] via SUBCUTANEOUS
  Administered 2020-01-31: 18:00:00 2 [IU] via SUBCUTANEOUS
  Administered 2020-01-31: 1 [IU] via SUBCUTANEOUS
  Administered 2020-01-31: 07:00:00 2 [IU] via SUBCUTANEOUS
  Administered 2020-02-01 – 2020-02-02 (×2): 1 [IU] via SUBCUTANEOUS
  Administered 2020-02-02 – 2020-02-03 (×3): 2 [IU] via SUBCUTANEOUS
  Administered 2020-02-03 – 2020-02-05 (×5): 1 [IU] via SUBCUTANEOUS
  Administered 2020-02-07: 2 [IU] via SUBCUTANEOUS
  Administered 2020-02-07: 19:00:00 1 [IU] via SUBCUTANEOUS
  Administered 2020-02-07 – 2020-02-08 (×2): 2 [IU] via SUBCUTANEOUS
  Administered 2020-02-08: 1 [IU] via SUBCUTANEOUS
  Administered 2020-02-08: 3 [IU] via SUBCUTANEOUS
  Administered 2020-02-09: 08:00:00 2 [IU] via SUBCUTANEOUS
  Administered 2020-02-09 (×2): 3 [IU] via SUBCUTANEOUS
  Administered 2020-02-10 (×2): 2 [IU] via SUBCUTANEOUS
  Administered 2020-02-10 – 2020-02-11 (×3): 1 [IU] via SUBCUTANEOUS
  Administered 2020-02-12: 2 [IU] via SUBCUTANEOUS
  Administered 2020-02-12 – 2020-02-14 (×5): 1 [IU] via SUBCUTANEOUS

## 2020-01-29 MED ORDER — SORBITOL 70 % SOLN: 30.0000 mL | Freq: Every day | Status: DC | PRN

## 2020-01-29 MED ORDER — ACETAMINOPHEN 325 MG PO TABS
650.0000 mg | ORAL_TABLET | Freq: Four times a day (QID) | ORAL | Status: DC | PRN
  Administered 2020-02-01 – 2020-02-18 (×10): 650 mg via ORAL
  Filled 2020-01-29 (×10): qty 2

## 2020-01-29 NOTE — Progress Notes (Signed)
Pharmacy Antibiotic Note  [name] [name] is a 64 y.o. 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 S/p lumbar debridement and hardware stabilization on 3/25.  Pharmacy is consulted for cefepime dosing.  Afebrile, WBC are normal, and renal function is stable, est ClCr ~40 ml/min.   Plan Continue cefepime 2 gm IV q12h, end date 03/05/20 Watch renal fxn and adjust dose as indicated Consider consulting [facility] to enter home infusion antibiotic orders    Temp (24hrs), Avg:98.5 F (36.9 C), Min:98 F (36.7 C), Max:99.1 F (37.3 C)  Recent Labs  Lab 01/25/20 0435 01/26/20 0551 01/27/20 0434 01/28/20 0356 01/29/20 0422  WBC 7.0 6.3 5.8 5.3 6.5  CREATININE 1.42* 1.33* 1.29* 1.28* 1.24*    Estimated Creatinine Clearance (by C-G formula based on SCr of 1.24 mg/dL (H)) Female: 44.4 mL/min (A) Female: 54.6 mL/min (A)    Antimicrobials this admission: Levofloxacin 3/25 >> 3/26 Vancomycin 3/25 >> 3/27 Rifampin 3/26 >> 3/27 Cefepime 3/27 >> (5/7)  Microbiology this admission: 3/23 BCx >> neg 3/23 COVID negative 3/24 MRSA PCR negative 3/25 Bone bx > rare Pseudomonas, S to cefepime  Thank you for involving pharmacy in this patient's care.  Benetta Spar, PharmD, BCPS, B[facility] Clinical Pharmacist  Please check AMION for all [facility] phone numbers After 10:00 PM, call [place] [phone]

## 2020-01-29 NOTE — Progress Notes (Signed)
Patient admitted to ip rehab during shift. Patient was teary eyed and uncomfortable  with double rooms, Ac and nurse spoke with patient and she agreed to stay in room. Skin assessment was preformed by this Probation officer and another nurse, patient noted to have wound to left heel and wouldn't allow  Nurse to remove dressing. masd noted to groin area, and under breast. Adria Devon

## 2020-01-29 NOTE — Progress Notes (Signed)
Inpatient Rehabilitation-Admissions Coordinator   Awaiting insurance determination for CIR. Will update as soon as we have an answer.   Raechel Ache, OTR/L  Rehab Admissions Coordinator  [phone] 01/29/2020 11:26 AM
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
## 2020-01-29 NOTE — Progress Notes (Signed)
Inpatient Rehabilitation-Admissions Coordinator   I have received insurance approval and medical clearance from Dr. Doristine Bosworth for admit to CIR today. Pt notified of approval and that she will be admitted to a semi-private room on [facility] Re[facility]. Pt wanting to proceed with admit. We reviewed insurance benefits letter and consent forms. TOC and bedside RN notified of plan for today.   Please call if questions.   Raechel Ache, OTR/L  Rehab Admissions Coordinator  [phone] 01/29/2020 2:28 PM

## 2020-01-29 NOTE — Discharge Summary (Signed)
Physician Discharge Summary  Cathryne Mancebo EGB:[id number] DOB: 23-Aug-1956 DOA: 01/20/2020  PCP: Ann Held, DO  Admit date: 01/20/2020 Discharge date: 01/29/2020  Admitted From: Home Disposition: CIR  Recommendations for Outpatient Follow-up:  1. Follow up with PCP in 1-2 weeks 2. Follow with neurosurgery in 2 weeks after discharge 3. Please obtain BMP/CBC in one week 4. Please follow up on the following pending results:  Home Health: None Equipment/Devices: None  Discharge Condition: Stable CODE STATUS: Full code Diet recommendation: Cardiac  Subjective: Patient seen and examined.  Feels much better.  Pain well controlled.  Brief/Interim Summary: 64yo with hx DM2, CKD stage 3, hx recent septic elbow with recent L1 compression fracture. Pt presented with worsened back pain, found to have L1 osteo. Neurosurgery and ID consulted.  She underwent anterolateral corpectomy with PSIF for osteomyelitis on 01/22/2020 by neurosurgery.  Tissue culture grew Pseudomonas.  ID recommended cefepime through 03/05/2020 to complete 6 weeks of antibiotic therapy.  Patient's other medical issues remained stable.  She was seen by PT OT after the surgery.  They recommended CIR.  She was accepted and insurance was authorized.  In the interim, patient's blood pressure was elevated for which she was started on amlodipine however after that, patient developed orthostatic hypotension so amlodipine was discontinued yesterday.  Patient's blood pressure has been doing fine since then and she has no more orthostatics.  Patient is going to be discharged in stable condition to CIR.  She will continue cefepime until 03/05/2020.  Follow-up with PCP and neurosurgery.  Discharge Diagnoses:  Principal Problem:   Osteomyelitis of vertebra of thoracolumbar region [facility]) Active Problems:   Essential hypertension   Hepatic cirrhosis (HCC)   CAD (coronary artery disease)   CKD (chronic kidney disease), stage III (HCC)    Ulcer of left heel, limited to breakdown of skin ([place])   Type 2 diabetes mellitus with hyperglycemia, with long-term current use of insulin (HCC)   Septic arthritis of elbow, right (HCC)   Paraspinal mass   Spinal stenosis of lumbar region    Discharge Instructions  Discharge Instructions    Discharge patient   Complete by: As directed    Discharge disposition: [facility] Facility   Discharge patient date: 01/29/2020   Home infusion instructions   Complete by: As directed    Instructions: Flushing of vascular access device: 0.9% NaCl pre/post medication administration and prn patency; Heparin 100 u/ml, 33m for implanted ports and Heparin 10u/ml, 532mfor all other central venous catheters.   Incentive spirometry RT   Complete by: As directed      Allergies as of 01/29/2020      Reactions   Bee Pollen Anaphylaxis   Fish-derived Products Hives, Shortness Of Breath, Swelling, Rash   Hives get in throat causing trouble breathing   Mushroom Extract Complex Anaphylaxis   Penicillins Anaphylaxis   Did it involve swelling of the face/tongue/throat, SOB, or low BP? Yes Did it involve sudden or severe rash/hives, skin peeling, or any reaction on the inside of your mouth or nose? No Did you need to seek medical attention at a hospital or doctor's office? Yes When did it last happen?A few months ago If all above answers are "NO", may proceed with cephalosporin use.   Rosemary Oil Anaphylaxis   Shellfish Allergy Hives, Shortness Of Breath, Swelling, Rash   Tomato Hives, Shortness Of Breath   Hives in throat causes her trouble breathing   Acetaminophen Other (See Comments)   GI upset  Acyclovir And Related Other (See Comments)   Unknown rxn   Aloe Vera Hives   Broccoli [brassica Oleracea] Hives   Naproxen Other (See Comments)   Unknown rxn      Medication List    STOP taking these medications   ciprofloxacin 250 MG tablet Commonly known as: Cipro     TAKE these medications    atorvastatin 10 MG tablet Commonly known as: LIPITOR Take 1 tablet by mouth once daily   CALCIUM ALGINATE EX Apply 1 patch topically every other day.   ceFEPime  IVPB Commonly known as: MAXIPIME Inject 2 g into the vein every 12 (twelve) hours. Indication:  Hardware associated vertebral infection Last Day of Therapy:  03/05/2020 Labs - Once weekly:  CBC/D and BMP, Labs - Every other week:  ESR and CRP   DULoxetine 60 MG capsule Commonly known as: CYMBALTA Take 1 capsule by mouth once daily   Ensure Max Protein Liqd Take 330 mLs (11 oz total) by mouth 2 (two) times daily.   EPINEPHrine 0.3 mg/0.3 mL Soaj injection Commonly known as: EPI-PEN Inject into the muscle.   fluticasone 50 MCG/ACT nasal spray Commonly known as: FLONASE Place 2 sprays into both nostrils daily.   HumaLOG KwikPen 200 UNIT/ML KwikPen Generic drug: insulin lispro INJECT 13 UNITS SUBCUTANEOUSLY ONCE DAILY What changed: See the new instructions.   HYDROcodone-acetaminophen 5-325 MG tablet Commonly known as: NORCO/VICODIN Take 1-2 tablets by mouth every 6 (six) hours as needed for moderate pain or severe pain.   Lantus SoloStar 100 UNIT/ML Solostar Pen Generic drug: insulin glargine INJECT 20 UNITS SUBCUTANEOUSLY ONCE DAILY IN THE MORNING AND THEN INJECT 40 UNITS AT BEDTIME What changed:   how much to take  how to take this  when to take this   lidocaine 5 % Commonly known as: Lidoderm Place 1 patch onto the skin daily. Remove & Discard patch within 12 hours or as directed by MD   Multivitamin Gummies [employer] 2 each by mouth daily.   Naftifine HCl 2 % Crea Apply qd for up to 4 weeks   ondansetron 4 MG disintegrating tablet Commonly known as: Zofran ODT Take 1 tablet (4 mg total) by mouth every 8 (eight) hours as needed for nausea or vomiting.   ondansetron 4 MG disintegrating tablet Commonly known as: Zofran ODT Take 1 tablet (4 mg total) by mouth every 4 (four) hours as needed  for nausea or vomiting.   polyethylene glycol 17 g packet Commonly known as: MIRALAX / GLYCOLAX Take 17 g by mouth daily as needed for mild constipation.   pregabalin 150 MG capsule Commonly known as: LYRICA Take 1 capsule (150 mg total) by mouth 2 (two) times daily.   promethazine 25 MG suppository Commonly known as: Phenergan Place 1 suppository (25 mg total) rectally every 6 (six) hours as needed for nausea or vomiting.            Home Infusion Instuctions  (From admission, onward)         Start     Ordered   01/29/20 0000  Home infusion instructions    Question:  Instructions  Answer:  Flushing of vascular access device: 0.9% NaCl pre/post medication administration and prn patency; Heparin 100 u/ml, 7m for implanted ports and Heparin 10u/ml, 540mfor all other central venous catheters.   01/29/20 1332         Follow-up Information    LoAnn HeldDO. Call.   Specialty: Family Medicine  Why: Please make a followup appointment with your primary physician within 1 week of your discharge home for medical consultation and needs. Contact information: [address] [place] [address] [phone]          Allergies  Allergen Reactions  . Bee Pollen Anaphylaxis  . Fish-Derived Products Hives, Shortness Of Breath, Swelling and Rash    Hives get in throat causing trouble breathing  . Mushroom Extract Complex Anaphylaxis  . Penicillins Anaphylaxis    Did it involve swelling of the face/tongue/throat, SOB, or low BP? Yes Did it involve sudden or severe rash/hives, skin peeling, or any reaction on the inside of your mouth or nose? No Did you need to seek medical attention at a hospital or doctor's office? Yes When did it last happen?A few months ago If all above answers are "NO", may proceed with cephalosporin use.  [name] Kitchen Rosemary Oil Anaphylaxis  . Shellfish Allergy Hives, Shortness Of Breath, Swelling and Rash  . Tomato Hives and Shortness  Of Breath    Hives in throat causes her trouble breathing  . Acetaminophen Other (See Comments)    GI upset  . Acyclovir And Related Other (See Comments)    Unknown rxn  . Aloe Vera Hives  . Broccoli [Brassica Oleracea] Hives  . Naproxen Other (See Comments)    Unknown rxn    Consultations: Neurosurgery and ID   Procedures/Studies: CT ABDOMEN PELVIS WO CONTRAST  Result Date: 01/20/2020 CLINICAL DATA:  Lower abdominal pain, constipation EXAM: CT ABDOMEN AND PELVIS WITHOUT CONTRAST TECHNIQUE: Multidetector CT imaging of the abdomen and pelvis was performed following the standard protocol without IV contrast. COMPARISON:  11/15/2019 FINDINGS: Lower chest: No acute pleural or parenchymal lung disease. Hepatobiliary: Nodular contour of the liver compatible with cirrhosis, stable. Calcified granulomata are seen within the liver parenchyma. The gallbladder is surgically absent. Pancreas: Unremarkable. No pancreatic ductal dilatation or surrounding inflammatory changes. Spleen: Normal in size without focal abnormality. Adrenals/Urinary Tract: No urinary tract calculi or obstructive uropathy. The bladder is unremarkable. The adrenals are normal. Stomach/Bowel: No bowel obstruction or ileus. Moderate fecal retention. Normal appendix right lower quadrant. No bowel wall thickening or inflammatory changes. Vascular/Lymphatic: Multiple subcentimeter lymph nodes are seen within the retroperitoneum, increased in number since prior study. No pathologically enlarged lymph nodes. Vascular calcifications are again seen throughout the aorta and its branches. Reproductive: Status post hysterectomy. No adnexal masses. Other: There is no free fluid or free gas within the abdomen or pelvis. Musculoskeletal: Since the prior exam, there is been progression of the compression deformity at L1, with marked bony resorption and development of vertebral plana. There is also cortical erosion within the superior endplate of L2 and  inferior endplate of U23. Overall, the appearance is highly concerning for progressive discitis/osteomyelitis. There is mild retropulsion of the L1 vertebral body with at least 50% narrowing of the central canal at the L1 level. MRI is recommended for further evaluation and to better assess to the mass effect upon the central canal and neural foramina. There is no obvious fluid collection or abscess. Effacement of the fat planes between the vertebral bodies and paraspinous musculature. IMPRESSION: 1. Interval development of L1 vertebral plana with marked bony resorption of the L1 vertebral body as well as erosive changes of the inferior T12 endplate and superior L2 endplate. Overall, the appearance and rapid progression is concerning for underlying discitis/osteomyelitis. 2. Slight retropulsion and phlegmon at the L1 level results in at least 50% narrowing of  the central canal. MRI is recommended for further evaluation. 3. No evidence of paraspinal abscess on this unenhanced exam. There is effacement of the fat planes between the upper lumbar spine and paraspinous musculature. Again, MRI would be useful for further evaluation. 4. Moderate fecal retention. 5. Cirrhosis. These results were called by telephone at the time of interpretation on 01/20/2020 at 3:13 pm to provider [facility] , who verbally acknowledged these results. Electronically Signed   By: Randa Ngo M.D.   On: 01/20/2020 15:13   DG Thoracic Spine 2 View  Result Date: 01/23/2020 CLINICAL DATA:  Fusion of spine, thoraco lung in. EXAM: THORACIC SPINE 2 VIEWS COMPARISON:  Thoracic spine MRI 01/20/2020 FINDINGS: A right IJ approach central venous catheter appears to terminate in the region of the right brachiocephalic vein. Surgical clips within the right upper quadrant of the abdomen. Vertebral body height is maintained. Partially imaged thoracolumbar posterior spinal fusion construct spanning the T11-L3 levels with L1 corpectomy cage. The  fusion construct is not demonstrate any adverse features. No new compression deformity. IMPRESSION: T11-L3 posterior spinal fusion construct with L1 corpectomy cage. The fusion construct demonstrates no adverse features. No new thoracic compression deformity. A right IJ approach central venous catheter appears to terminate in the region of the right brachiocephalic vein. Correlate with desired positioning. Electronically Signed   By: Kellie Simmering DO   On: 01/23/2020 07:42   DG Lumbar Spine 2-3 Views  Result Date: 01/23/2020 CLINICAL DATA:  Fusion of spine, thoracolumbar region. EXAM: LUMBAR SPINE - 2-3 VIEW COMPARISON:  Lumbar spine MRI 01/20/2020 FINDINGS: Surgical clips within the right upper quadrant of the abdomen. Posterior spinal fusion construct spanning the T11-L3 levels with L1 corpectomy cage. The fusion construct demonstrates no adverse features. Redemonstrated irregularity of the L2 superior endplate with mild height loss. No new compression deformity. IMPRESSION: T11-L3 posterior spinal fusion construct with L1 corpectomy cage. The fusion construct demonstrates no adverse features. Redemonstrated irregularity of the L2 superior endplate with mild height loss. No new lumbar compression deformity. Electronically Signed   By: Kellie Simmering DO   On: 01/23/2020 07:43   DG Lumbar Spine 2-3 Views  Result Date: 01/22/2020 CLINICAL DATA:  Lumbar spine surgery. EXAM: LUMBAR SPINE - 2-3 VIEW; DG C-ARM 1-60 MIN COMPARISON:  CT 11/15/2019 and MRI 01/20/2020 FINDINGS: Eleven spot intraoperative images of the lumbar spine demonstrate placement of a spacer over the previous site of patient's known L1 compression fracture with associated posterior fusion hardware from T11 to at least L3 but possibly extending below the L3 level. Hardware is intact. There is normal alignment of the visualized thoracolumbar spine. IMPRESSION: Intraoperative findings as described with posterior fusion hardware intact from T11 to at  least the L3 level bridging a spacer device at the L1 level. Recommend correlation with findings at the time of the procedure. Electronically Signed   By: Marin Olp M.D.   On: 01/22/2020 20:27   MR THORACIC SPINE W WO CONTRAST  Result Date: 01/20/2020 CLINICAL DATA:  Back pain. Abdominal pain. CT scan demonstrates destruction of the L1 vertebral body. History of benign-appearing compression fracture in January 21. EXAM: MRI THORACIC WITHOUT AND WITH CONTRAST TECHNIQUE: Multiplanar and multiecho pulse sequences of the thoracic spine were obtained without and with intravenous contrast. CONTRAST:  45m GADAVIST GADOBUTROL 1 MMOL/ML IV SOLN COMPARISON:  MRI of the lumbar spine dated 01/20/2020 and CT scans dated 01/20/2020 and 11/11/2019 FINDINGS: MRI THORACIC SPINE FINDINGS Alignment:  Physiologic. Vertebrae: Severe compression fracture of  L1 with abnormal signal from the T12 vertebral body with erosion/destruction of a portion of the inferior endplate of D35. Edema and enhancement around the facet joints at T12-L1. The remainder of the thoracic spine appears normal. Cord: The tip of the conus is at T12-L1. There is protrusion of bone and soft tissue into the spinal canal at T12-L1 with moderately severe compression of the thecal sac. Paraspinal and other soft tissues: Paraspinal phlegmon extending into the psoas muscles on the right and left with abnormal edema and enhancement of those soft tissues as well as extending into the anterior epidural space at T12-L1 and L1-2. Disc levels: The discs from C7-T1 through T11-12 are normal. T12-L1: There is no disc bulging or protrusion. There is slight edema in the expanded disc space but there is no abnormal enhancement of the disc after contrast administration. There is erosion/destruction of a small segment of the inferior endplate of T01 with destruction of the L1 vertebral body with protrusion of bone and soft tissue into the anterior epidural space as described  above. Paraspinal phlegmon. IMPRESSION: 1. Severe compression fracture of T12 with protrusion of bone and soft tissue into the spinal canal with moderately severe compression of the thecal sac. I suspect this represents an atypical infection such as tuberculosis or actinomycosis. B-cell lymphoma should be considered. The patient is afebrile and has a normal white blood count at this time. 2. Paraspinal phlegmon at T12-L1 and L1-2. Electronically Signed   By: Lorriane Shire M.D.   On: 01/20/2020 17:50   MR Lumbar Spine W Wo Contrast  Result Date: 01/20/2020 CLINICAL DATA:  Progressive compression fracture of L1. CT scan of the abdomen dated 01/20/2020 suggests the possibility of discitis/osteomyelitis. EXAM: MRI LUMBAR SPINE WITHOUT AND WITH CONTRAST TECHNIQUE: Multiplanar and multiecho pulse sequences of the lumbar spine were obtained without and with intravenous contrast. CONTRAST:  62m GADAVIST GADOBUTROL 1 MMOL/ML IV SOLN COMPARISON:  CT scan dated 01/20/2020 and 11/15/2019 FINDINGS: Segmentation:  Standard. Alignment: Severe compression fracture of L1 with protrusion of the posterior margin of the into the spinal canal approximately 6 mm. Vertebrae: There is a severe compression fracture of L1 with fragmentation of the vertebral body. There are new erosions of the inferior endplate of TX79and of the superior endplate of L2 with abnormal enhancement of those vertebra as well as abnormal enhancement of the remnants of the L1 vertebral body. There is abnormal soft tissue surrounding the vertebral bodies from T12 through L2 with abnormal enhancement after contrast administration. The process involves the adjacent psoas muscles. There is also protrusion of bone and soft tissue into the spinal canal. There is enhancement of the protruding bone and soft tissue with moderately severe compression of the thecal sac. There is no significant abnormality of the discs at T12-L1 and L1-2. There is slight enhancement around  the facet joints at T12-L1 and L1-2. Conus medullaris: Conus extends to the T12-L1 level. Paraspinal and other soft tissues: There is abnormal edema and enhancement of the psoas muscles from T12 through L2-3 bilaterally. No discrete paraspinal abscess. There are some bone fragments in the paraspinal soft tissues within the right psoas muscle at T12-L1 level. Disc levels: T11-12: Normal. T12-L1: No disc bulging or disc protrusion. Enhancing epidural soft tissue extends into the spinal canal and compresses the thecal sac without a definable epidural abscess. Destruction of a portion of the inferior endplate of TT90 Posterior margin of the fractured and destroyed L1 vertebral body protrudes 6 mm into the spinal  canal. L1-2: The disc appears normal. Abnormal enhancement of the L1 vertebral body fragments and of the superior endplate of L2 destruction of a portion of the superior endplate. Abnormal enhancing soft tissue is seen anterior to the thecal sac at L1-2 with slight symmetrical compression of the thecal sac at that level. L2-3: Tiny disc bulges into the neural foramina without neural impingement. Otherwise negative. L3-4: Small broad-based disc bulge with symmetrical slight compression of the thecal sac. The bulges extend into both neural foramina without focal neural impingement. L4-5: Small broad-based disc bulge asymmetric into the right neural foramen, touching right L4 nerve within the neural foramen. L5-S1: Small central disc bulge without neural impingement. Otherwise negative. IMPRESSION: Destruction of the L1 vertebral body with partial destruction of the inferior endplates of P10 and L2 with abnormal enhancing paraspinal and epidural soft tissues at that level. No definable abscess. I suspect this represents atypical infection such as TB or actinomycosis. B-cell lymphoma can also give this appearance. There is moderate compression of the thecal sac at L1 as described above. The patient has a history of  osteomyelitis and MRSA. However, the patient's white blood count and temperature are normal at this time. Electronically Signed   By: Lorriane Shire M.D.   On: 01/20/2020 17:44   DG Chest Port 1 View  Result Date: 01/22/2020 CLINICAL DATA:  64 year old female line placement. Status post thoracolumbar surgery, suspected spinal infection. EXAM: PORTABLE CHEST 1 VIEW COMPARISON:  Chest radiographs 11/12/2019 and earlier. FINDINGS: Portable AP semi upright view at 2040 hours. Right IJ approach central line placed. Tip at the cavoatrial junction. Mediastinal contours remain normal. Low lung volumes. No pneumothorax. Allowing for portable technique the lungs are clear. New lower thoracic and upper lumbar spinal hardware partially visible. Stable cholecystectomy clips. Paucity of bowel gas. Chronic osseous deformities about the left shoulder. IMPRESSION: 1. Right IJ central line placed, tip at the cavoatrial junction. No acute cardiopulmonary abnormality. 2. Partially visible new thoracolumbar spine hardware. Electronically Signed   By: Genevie Ann M.D.   On: 01/22/2020 21:01   DG C-Arm 1-60 Min  Result Date: 01/22/2020 CLINICAL DATA:  Lumbar spine surgery. EXAM: LUMBAR SPINE - 2-3 VIEW; DG C-ARM 1-60 MIN COMPARISON:  CT 11/15/2019 and MRI 01/20/2020 FINDINGS: Eleven spot intraoperative images of the lumbar spine demonstrate placement of a spacer over the previous site of patient's known L1 compression fracture with associated posterior fusion hardware from T11 to at least L3 but possibly extending below the L3 level. Hardware is intact. There is normal alignment of the visualized thoracolumbar spine. IMPRESSION: Intraoperative findings as described with posterior fusion hardware intact from T11 to at least the L3 level bridging a spacer device at the L1 level. Recommend correlation with findings at the time of the procedure. Electronically Signed   By: Marin Olp M.D.   On: 01/22/2020 20:27   [name] EKG SITE  RITE  Result Date: 01/25/2020 If Site Rite image not attached, placement could not be confirmed due to current cardiac rhythm.     Discharge Exam: Vitals:   01/29/20 0419 01/29/20 0752  BP: (!) 118/56 125/61  Pulse: 75 71  Resp: 20 18  Temp: 98.6 F (37 C) 98 F (36.7 C)  SpO2: 96% 98%   Vitals:   01/28/20 1944 01/29/20 0047 01/29/20 0419 01/29/20 0752  BP: (!) 158/66 (!) 146/60 (!) 118/56 125/61  Pulse: 85 86 75 71  Resp: 18 18 20 18   Temp: 98.4 F (36.9 C) 98.3 F (  36.8 C) 98.6 F (37 C) 98 F (36.7 C)  TempSrc: Oral Oral Oral Oral  SpO2: 98% 97% 96% 98%  Weight:      Height:        General: Pt is alert, awake, not in acute distress Cardiovascular: RRR, S1/S2 +, no rubs, no gallops Respiratory: CTA bilaterally, no wheezing, no rhonchi Abdominal: Soft, NT, ND, bowel sounds + Extremities: no edema, no cyanosis    The results of significant diagnostics from this hospitalization (including imaging, microbiology, ancillary and laboratory) are listed below for reference.     Microbiology: Recent Results (from the past 240 hour(s))  Blood culture (routine x 2)     Status: None   Collection Time: 01/20/20  1:34 PM   Specimen: BLOOD RIGHT HAND  Result Value Ref Range Status   Specimen Description BLOOD RIGHT HAND  Final   Special Requests   Final    BOTTLES DRAWN AEROBIC ONLY Blood Culture results may not be optimal due to an inadequate volume of blood received in culture bottles   Culture   Final    NO GROWTH 5 DAYS Performed at [facility], [place] [address], [place], [place] [address]    Report Status 01/25/2020 FINAL  Final  Blood culture (routine x 2)     Status: None   Collection Time: 01/20/20  3:28 PM   Specimen: BLOOD  Result Value Ref Range Status   Specimen Description BLOOD RIGHT ANTECUBITAL  Final   Special Requests   Final    BOTTLES DRAWN AEROBIC AND ANAEROBIC Blood Culture adequate volume   Culture   Final    NO GROWTH 5 DAYS Performed  at [facility], [address], [place], [place] [address]    Report Status 01/25/2020 FINAL  Final  SARS CORONAVIRUS 2 (TAT 6-24 HRS) Nasopharyngeal Nasopharyngeal Swab     Status: None   Collection Time: 01/20/20  3:37 PM   Specimen: Nasopharyngeal Swab  Result Value Ref Range Status   SARS Coronavirus 2 NEGATIVE NEGATIVE Final    Comment: (NOTE) SARS-CoV-2 target nucleic acids are NOT DETECTED. The SARS-CoV-2 RNA is generally detectable in upper and lower respiratory specimens during the acute phase of infection. Negative results do not preclude SARS-CoV-2 infection, do not rule out co-infections with other pathogens, and should not be used as the sole basis for treatment or other patient management decisions. Negative results must be combined with clinical observations, patient history, and epidemiological information. The expected result is Negative. Fact Sheet for Patients: SugarRoll.be Fact Sheet for Healthcare Providers: https://www.woods-mathews.com/ This test is not yet approved or cleared by the [place] FDA and  has been authorized for detection and/or diagnosis of SARS-CoV-2 by FDA under an Emergency Use Authorization (EUA). This EUA will remain  in effect (meaning this test can be used) for the duration of the COVID-19 declaration under Section 56 4(b)(1) of the Act, 21 U.S.C. section 360bbb-3(b)(1), unless the authorization is terminated or revoked sooner. Performed at [facility], [place] [address] [place], [place] [address]   MRSA PCR Screening     Status: None   Collection Time: 01/21/20  5:25 AM   Specimen: Nasal Mucosa; Nasopharyngeal  Result Value Ref Range Status   MRSA by PCR NEGATIVE NEGATIVE Final    Comment:        The GeneXpert MRSA Assay (FDA approved for NASAL specimens only), is one component of a comprehensive MRSA colonization surveillance program. It is not intended  to diagnose  MRSA infection nor to guide or monitor treatment for MRSA infections. Performed at [facility], [place] [address], [place], [place] [address]   Surgical pcr screen     Status: Abnormal   Collection Time: 01/21/20  3:24 PM   Specimen: Nasal Mucosa; Nasal Swab  Result Value Ref Range Status   MRSA, PCR NEGATIVE NEGATIVE Final   Staphylococcus aureus POSITIVE (A) NEGATIVE Final    Comment: (NOTE) The Xpert SA Assay (FDA approved for NASAL specimens in patients 15 years of age and older), is one component of a comprehensive surveillance program. It is not intended to diagnose infection nor to guide or monitor treatment. Performed at [facility], [place] [address], [place], [place] [address]   Surgical pcr screen     Status: Abnormal   Collection Time: 01/22/20  3:46 AM   Specimen: Nasal Mucosa; Nasal Swab  Result Value Ref Range Status   MRSA, PCR NEGATIVE NEGATIVE Final   Staphylococcus aureus POSITIVE (A) NEGATIVE Final    Comment: CRITICAL RESULT CALLED TO, READ BACK BY AND VERIFIED WITH: RN DAVID Mamie Nick [phone] @[phone]  THANEY Performed at [facility], [address] [place], [place] [address]   Fungus Culture With Stain     Status: None (Preliminary result)   Collection Time: 01/22/20  3:25 PM   Specimen: PATH Bone biopsy; Tissue  Result Value Ref Range Status   Fungus Stain Final report  Final    Comment: (NOTE) Performed At: [facility] [place], [place] [id number] Rush Farmer MD XB:[id number]    Fungus (Mycology) Culture PENDING  Incomplete   Fungal Source TISSUE  Final    Comment: LUMBAR 1 Performed at [facility], [place] [address], [place], [place] [address]   Aerobic/Anaerobic Culture (surgical/deep wound)     Status: None   Collection Time: 01/22/20  3:25 PM   Specimen: PATH Bone biopsy; Tissue  Result Value Ref Range Status   Specimen Description TISSUE  Final   Special Requests LUMBAR 1  Final   Gram Stain NO WBC  SEEN RARE GRAM POSITIVE COCCI   Final   Culture   Final    RARE PSEUDOMONAS AERUGINOSA NO ANAEROBES ISOLATED Performed at [facility], [address], [place], [place] [address]    Report Status 01/27/2020 FINAL  Final   Organism ID, Bacteria PSEUDOMONAS AERUGINOSA  Final      Susceptibility   Pseudomonas aeruginosa - MIC*    CEFTAZIDIME 4 SENSITIVE Sensitive     CIPROFLOXACIN <=0.25 SENSITIVE Sensitive     GENTAMICIN <=1 SENSITIVE Sensitive     IMIPENEM 1 SENSITIVE Sensitive     PIP/TAZO 8 SENSITIVE Sensitive     CEFEPIME 2 SENSITIVE Sensitive     * RARE PSEUDOMONAS AERUGINOSA  Acid Fast Smear (AFB)     Status: None   Collection Time: 01/22/20  3:25 PM   Specimen: PATH Bone biopsy; Tissue  Result Value Ref Range Status   AFB Specimen Processing Comment  Final    Comment: Tissue Grinding and Digestion/Decontamination   Acid Fast Smear Negative  Final    Comment: (NOTE) Performed At: [facility] [place], [place] [id number] Rush Farmer MD SL:[id number]    Source (AFB) TISSUE  Final    Comment: LUMBAR 1 Performed at [facility], [place] [address], [place], [place] [address]   Fungus Culture Result     Status: None  Collection Time: 01/22/20  3:25 PM  Result Value Ref Range Status   Result 1 Comment  Final    Comment: (NOTE) KOH/Calcofluor preparation:  no fungus observed. Performed At: [facility] [place], [place] [id number] Rush Farmer MD BP:[id number]   Fungus Culture With Stain     Status: None (Preliminary result)   Collection Time: 01/22/20  5:06 PM   Specimen: PATH Bone biopsy; Tissue  Result Value Ref Range Status   Fungus Stain Final report  Final    Comment: (NOTE) Performed At: [facility] [place], [place] [id number] Rush Farmer MD ER:[id number]    Fungus (Mycology) Culture PENDING  Incomplete   Fungal Source 2  Final    Comment: TISSUE LUMBAR 1 Performed at  [facility], [place] [address] [place], [place] [address]   Aerobic/Anaerobic Culture (surgical/deep wound)     Status: None   Collection Time: 01/22/20  5:06 PM   Specimen: PATH Bone biopsy; Tissue  Result Value Ref Range Status   Specimen Description TISSUE  Final   Special Requests 2 LUMBAR 1  Final   Gram Stain NO WBC SEEN NO ORGANISMS SEEN   Final   Culture   Final    RARE PSEUDOMONAS AERUGINOSA CRITICAL RESULT CALLED TO, READ BACK BY AND VERIFIED WITH: RN J [id number] FCP NO ANAEROBES ISOLATED Performed at [facility], [place] [address], [place], [place] [address]    Report Status 01/27/2020 FINAL  Final   Organism ID, Bacteria PSEUDOMONAS AERUGINOSA  Final      Susceptibility   Pseudomonas aeruginosa - MIC*    CEFTAZIDIME 4 SENSITIVE Sensitive     CIPROFLOXACIN 0.5 SENSITIVE Sensitive     GENTAMICIN <=1 SENSITIVE Sensitive     IMIPENEM 1 SENSITIVE Sensitive     PIP/TAZO 8 SENSITIVE Sensitive     CEFEPIME 2 SENSITIVE Sensitive     * RARE PSEUDOMONAS AERUGINOSA  Acid Fast Smear (AFB)     Status: None   Collection Time: 01/22/20  5:06 PM   Specimen: PATH Bone biopsy; Tissue  Result Value Ref Range Status   AFB Specimen Processing Comment  Final    Comment: Tissue Grinding and Digestion/Decontamination   Acid Fast Smear Negative  Final    Comment: (NOTE) Performed At: [facility] [place], [place] [id number] Rush Farmer MD TK:[id number]    Source (AFB) 2  Final    Comment: TISSUE LUMBAR 1 Performed at [facility], [place] [address] [place], [place] [address]   Fungus Culture Result     Status: None   Collection Time: 01/22/20  5:06 PM  Result Value Ref Range Status   Result 1 Comment  Final    Comment: (NOTE) KOH/Calcofluor preparation:  no fungus observed. Performed At: [facility] [place], [place] [id number] Rush Farmer MD [id number]      Labs: BNP (last 3 results) No results  for input(s): BNP in the last 8760 hours. Basic Metabolic Panel: Recent Labs  Lab 01/25/20 0435 01/26/20 0551 01/27/20 0434 01/28/20 0356 01/29/20 0422  NA 136 138 138 138 138  K 4.1 3.8 3.7 3.6 3.8  CL 105 105 103 106 103  CO2 24 25 26 24 25   GLUCOSE 154* 93 208* 138* 238*  BUN 18 14 14 13 13   CREATININE 1.42* 1.33* 1.29* 1.28* 1.24*  CALCIUM 8.2* 8.6* 8.5* 8.0* 7.9*  MG  --   --   --  1.9  --    Liver Function Tests: Recent Labs  Lab 01/26/20 0551 01/27/20 0434 01/28/20 0356  AST 13* 12* 14*  ALT 11 9 10   ALKPHOS 69 68 63  BILITOT 0.7 0.6 0.3  PROT 6.1* 5.9* 6.1*  ALBUMIN 1.7* 1.6* 1.7*   No results for input(s): LIPASE, AMYLASE in the last 168 hours. No results for input(s): AMMONIA in the last 168 hours. CBC: Recent Labs  Lab 01/25/20 0435 01/26/20 0551 01/27/20 0434 01/28/20 0356 01/29/20 0422  WBC 7.0 6.3 5.8 5.3 6.5  HGB 7.2* 7.8* 7.4* 7.5* 7.2*  HCT 23.7* 25.1* 24.3* 25.0* 23.7*  MCV 86.2 82.6 85.0 84.7 85.3  PLT 159 186 191 190 205   Cardiac Enzymes: No results for input(s): CKTOTAL, CKMB, CKMBINDEX, TROPONINI in the last 168 hours. BNP: Invalid input(s): POCBNP CBG: Recent Labs  Lab 01/28/20 0646 01/28/20 1143 01/28/20 1822 01/28/20 2127 01/29/20 1125  GLUCAP 183* 154* 162* 204* 158*   D-Dimer No results for input(s): DDIMER in the last 72 hours. Hgb A1c No results for input(s): HGBA1C in the last 72 hours. Lipid Profile No results for input(s): CHOL, HDL, LDLCALC, TRIG, CHOLHDL, LDLDIRECT in the last 72 hours. Thyroid function studies No results for input(s): TSH, T4TOTAL, T3FREE, THYROIDAB in the last 72 hours.  Invalid input(s): FREET3 Anemia work up No results for input(s): VITAMINB12, FOLATE, FERRITIN, TIBC, IRON, RETICCTPCT in the last 72 hours. Urinalysis    Component Value Date/Time   COLORURINE YELLOW 01/21/2020 0655   APPEARANCEUR TURBID (A) 01/21/2020 0655   LABSPEC 1.016 01/21/2020 0655   PHURINE 5.0 01/21/2020 0655    GLUCOSEU 150 (A) 01/21/2020 0655   GLUCOSEU 500 (A) 12/03/2019 1309   HGBUR MODERATE (A) 01/21/2020 0655   HGBUR moderate 08/05/2010 1048   BILIRUBINUR NEGATIVE 01/21/2020 0655   BILIRUBINUR neg 11/15/2017 [place] 01/21/2020 0655   PROTEINUR 100 (A) 01/21/2020 0655   UROBILINOGEN >=8.0 (A) 12/03/2019 1309   NITRITE NEGATIVE 01/21/2020 0655   LEUKOCYTESUR LARGE (A) 01/21/2020 0655   Sepsis Labs Invalid input(s): PROCALCITONIN,  WBC,  LACTICIDVEN Microbiology Recent Results (from the past 240 hour(s))  Blood culture (routine x 2)     Status: None   Collection Time: 01/20/20  1:34 PM   Specimen: BLOOD RIGHT HAND  Result Value Ref Range Status   Specimen Description BLOOD RIGHT HAND  Final   Special Requests   Final    BOTTLES DRAWN AEROBIC ONLY Blood Culture results may not be optimal due to an inadequate volume of blood received in culture bottles   Culture   Final    NO GROWTH 5 DAYS Performed at [facility], [place] [address] [place], [place] [address]    Report Status 01/25/2020 FINAL  Final  Blood culture (routine x 2)     Status: None   Collection Time: 01/20/20  3:28 PM   Specimen: BLOOD  Result Value Ref Range Status   Specimen Description BLOOD RIGHT ANTECUBITAL  Final   Special Requests   Final    BOTTLES DRAWN AEROBIC AND ANAEROBIC Blood Culture adequate volume   Culture   Final    NO GROWTH 5 DAYS Performed at [facility], [address], [place], [place] [address]    Report Status 01/25/2020 FINAL  Final  SARS CORONAVIRUS 2 (TAT 6-24 HRS) Nasopharyngeal Nasopharyngeal Swab     Status: None   Collection Time: 01/20/20  3:37 PM   Specimen: Nasopharyngeal Swab  Result Value Ref Range Status   SARS Coronavirus 2 NEGATIVE NEGATIVE Final    Comment: (NOTE) SARS-CoV-2 target nucleic acids are NOT DETECTED. The SARS-CoV-2 RNA is generally detectable in upper and lower respiratory specimens during the acute phase of infection.  Negative results do not preclude SARS-CoV-2 infection, do not rule out co-infections with other pathogens, and should not be used as the sole basis for treatment or other patient management decisions. Negative results must be combined with clinical observations, patient history, and epidemiological information. The expected result is Negative. Fact Sheet for Patients: SugarRoll.be Fact Sheet for Healthcare Providers: https://www.woods-mathews.com/ This test is not yet approved or cleared by the [place] FDA and  has been authorized for detection and/or diagnosis of SARS-CoV-2 by FDA under an Emergency Use Authorization (EUA). This EUA will remain  in effect (meaning this test can be used) for the duration of the COVID-19 declaration under Section 56 4(b)(1) of the Act, 21 U.S.C. section 360bbb-3(b)(1), unless the authorization is terminated or revoked sooner. Performed at [facility], [place] [address] [place], [place] [address]   MRSA PCR Screening     Status: None   Collection Time: 01/21/20  5:25 AM   Specimen: Nasal Mucosa; Nasopharyngeal  Result Value Ref Range Status   MRSA by PCR NEGATIVE NEGATIVE Final    Comment:        The GeneXpert MRSA Assay (FDA approved for NASAL specimens only), is one component of a comprehensive MRSA colonization surveillance program. It is not intended to diagnose MRSA infection nor to guide or monitor treatment for MRSA infections. Performed at [facility], [place] [address] [place], [place] [address]   Surgical pcr screen     Status: Abnormal   Collection Time: 01/21/20  3:24 PM   Specimen: Nasal Mucosa; Nasal Swab  Result Value Ref Range Status   MRSA, PCR NEGATIVE NEGATIVE Final   Staphylococcus aureus POSITIVE (A) NEGATIVE Final    Comment: (NOTE) The Xpert SA Assay (FDA approved for NASAL specimens in patients 30 years of age and older), is one component of a  comprehensive surveillance program. It is not intended to diagnose infection nor to guide or monitor treatment. Performed at [facility], [place] [name] [place], [place] [address]   Surgical pcr screen     Status: Abnormal   Collection Time: 01/22/20  3:46 AM   Specimen: Nasal Mucosa; Nasal Swab  Result Value Ref Range Status   MRSA, PCR NEGATIVE NEGATIVE Final   Staphylococcus aureus POSITIVE (A) NEGATIVE Final    Comment: CRITICAL RESULT CALLED TO, READ BACK BY AND VERIFIED WITH: RN DAVID Mamie Nick [phone] @0610  THANEY Performed at [facility], [place] [address] [place], [place] [address]   Fungus Culture With Stain     Status: None (Preliminary result)   Collection Time: 01/22/20  3:25 PM   Specimen: PATH Bone biopsy; Tissue  Result Value Ref Range Status   Fungus Stain Final report  Final    Comment: (NOTE) Performed At: [facility] [place], [place] [id number] Rush Farmer MD [id number]    Fungus (Mycology) Culture PENDING  Incomplete   Fungal Source TISSUE  Final    Comment: LUMBAR 1 Performed at [facility], [place] [address], [place], [place] [address]   Aerobic/Anaerobic Culture (surgical/deep wound)     Status: None   Collection Time: 01/22/20  3:25 PM   Specimen: PATH Bone biopsy; Tissue  Result Value  Ref Range Status   Specimen Description TISSUE  Final   Special Requests LUMBAR 1  Final   Gram Stain NO WBC SEEN RARE GRAM POSITIVE COCCI   Final   Culture   Final    RARE PSEUDOMONAS AERUGINOSA NO ANAEROBES ISOLATED Performed at [facility], [address], [place], [place] [address]    Report Status 01/27/2020 FINAL  Final   Organism ID, Bacteria PSEUDOMONAS AERUGINOSA  Final      Susceptibility   Pseudomonas aeruginosa - MIC*    CEFTAZIDIME 4 SENSITIVE Sensitive     CIPROFLOXACIN <=0.25 SENSITIVE Sensitive     GENTAMICIN <=1 SENSITIVE Sensitive     IMIPENEM 1 SENSITIVE Sensitive     PIP/TAZO 8 SENSITIVE  Sensitive     CEFEPIME 2 SENSITIVE Sensitive     * RARE PSEUDOMONAS AERUGINOSA  Acid Fast Smear (AFB)     Status: None   Collection Time: 01/22/20  3:25 PM   Specimen: PATH Bone biopsy; Tissue  Result Value Ref Range Status   AFB Specimen Processing Comment  Final    Comment: Tissue Grinding and Digestion/Decontamination   Acid Fast Smear Negative  Final    Comment: (NOTE) Performed At: [facility] [place], [place] [id number] Rush Farmer MD EX:[id number]    Source (AFB) TISSUE  Final    Comment: LUMBAR 1 Performed at [facility], [place] [address], [place], [place] [address]   Fungus Culture Result     Status: None   Collection Time: 01/22/20  3:25 PM  Result Value Ref Range Status   Result 1 Comment  Final    Comment: (NOTE) KOH/Calcofluor preparation:  no fungus observed. Performed At: [facility] [place], [place] [id number] Rush Farmer MD [id number]   Fungus Culture With Stain     Status: None (Preliminary result)   Collection Time: 01/22/20  5:06 PM   Specimen: PATH Bone biopsy; Tissue  Result Value Ref Range Status   Fungus Stain Final report  Final    Comment: (NOTE) Performed At: [facility] [place], [place] [id number] Rush Farmer MD [id number]    Fungus (Mycology) Culture PENDING  Incomplete   Fungal Source 2  Final    Comment: TISSUE LUMBAR 1 Performed at [facility], [place] [address], [place], [place] [address]   Aerobic/Anaerobic Culture (surgical/deep wound)     Status: None   Collection Time: 01/22/20  5:06 PM   Specimen: PATH Bone biopsy; Tissue  Result Value Ref Range Status   Specimen Description TISSUE  Final   Special Requests 2 LUMBAR 1  Final   Gram Stain NO WBC SEEN NO ORGANISMS SEEN   Final   Culture   Final    RARE PSEUDOMONAS AERUGINOSA CRITICAL RESULT CALLED TO, READ BACK BY AND VERIFIED WITH: RN J [id number] FCP NO ANAEROBES  ISOLATED Performed at [facility], [place] [address] [place],  [address]    Report Status 01/27/2020 FINAL  Final   Organism ID, Bacteria PSEUDOMONAS AERUGINOSA  Final      Susceptibility   Pseudomonas aeruginosa - MIC*    CEFTAZIDIME 4 SENSITIVE Sensitive     CIPROFLOXACIN 0.5 SENSITIVE Sensitive     GENTAMICIN <=1 SENSITIVE Sensitive     IMIPENEM 1 SENSITIVE Sensitive     PIP/TAZO 8 SENSITIVE Sensitive     CEFEPIME 2 SENSITIVE Sensitive     * RARE PSEUDOMONAS AERUGINOSA  Acid Fast  Smear (AFB)     Status: None   Collection Time: 01/22/20  5:06 PM   Specimen: PATH Bone biopsy; Tissue  Result Value Ref Range Status   AFB Specimen Processing Comment  Final    Comment: Tissue Grinding and Digestion/Decontamination   Acid Fast Smear Negative  Final    Comment: (NOTE) Performed At: [facility] [place], [place] [id number] Rush Farmer MD [id number]    Source (AFB) 2  Final    Comment: TISSUE LUMBAR 1 Performed at [facility], [place] [address] [place], [place] [address]   Fungus Culture Result     Status: None   Collection Time: 01/22/20  5:06 PM  Result Value Ref Range Status   Result 1 Comment  Final    Comment: (NOTE) KOH/Calcofluor preparation:  no fungus observed. Performed At: [facility] [place], [place] [id number] Rush Farmer MD CB:[id number]      Time coordinating discharge: Over 30 minutes  SIGNED:   Darliss Cheney, MD  [facility] 01/29/2020, 1:34 PM  If 7PM-7AM, please contact night-coverage [email or web address]

## 2020-01-29 NOTE — Progress Notes (Signed)
Courtney Heys, MD  Physician  Physical Medicine and Rehabilitation  PMR Pre-admission      Signed  Date of Service:  01/27/2020  5:14 PM      Related encounter: ED to Hosp-Admission (Discharged) from 01/20/2020 in [facility]        PMR Admission Coordinator Pre-Admission Assessment   Patient: [name] [name] is an 64 y.o., adult MRN: [id number] DOB: Oct 20, 1956 Height: 5' 1"  (154.9 cm) Weight: 81.7 kg   Insurance Information HMO: yes    PPO:      PCP:      IPA:      80/20:      OTHER: PRIMARY: [employer]      Policy#: [id number]      Subscriber: patient CM Name: Crystal      Phone#: [phone]     Fax#: [phone] Pre-Cert#: [id number]      Employer:  Josem Kaufmann ([id number]) provided by Donella Stade for admit to CIR. Pt is approved for 1 week with start date 4/1 and last covered date 4/7. Clinical updates due 4/8. No CM assigned for follow up yet. Crystal's (p): [phone]; fax clinicals (f): [phone] Benefits:  Phone #: [phone]     Name: Marciano Sequin. Date: 12/29/2018 - still active     Deduct: $500 ($500)      Out of Pocket Max: $2,500 ($2,500 met)      Life Max: NA CIR: 80% coverage, 20% co-insurance, limited by medical necessity      SNF: 80% coverage, 20% co-insurance, limited by medical necessity Outpatient: $50/visit co-pay pending service; combined 30 visits PT/OT     Home Health: 80% coverage, 20% co-insruance; limited by medical necessity      DME: 80% coverage, 20% co-insurance    Providers:  SECONDARY: None      Policy#:       Subscriber:  CM Name:       Phone#:      Fax#:  Pre-Cert#:       Employer:  Benefits:  Phone #:      Name:  Eff. Date:      Deduct:      Out of Pocket Max:       Life Max: CIR:       SNF:  Outpatient:      Co-Pay: Home Health:       Co-Pay:  DME:     Co-Pay:    Medicaid Application Date:       Case Manager:  Disability Application Date:       Case Worker:   The "Data  Collection Information Summary" for patients in Inpatient Rehabilitation Facilities with attached "Privacy Act [place] Records" was provided and verbally reviewed with: N/A   Emergency Contact Information         Contact Information     Name Relation Home Work Mobile    Donelan,[name] Spouse [phone]             Current Medical History  Patient Admitting Diagnosis: Osteomyelitis of vertebra of thoracolumbar region, L1 lumbar discitis with paraspinal fluid enhancement    History of Present Illness: [name] [name] is a 64 year old female with history of anxiety, liver cirrhosis, fibromyalgia, PTSD, legally blind, diabetes mellitus, CKD stage III , chronic anemia, 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 Sharol Given as well as multiple I&D's of the right and left foot.  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  Per chart review patient lives with spouse.  1 level home 2 steps to entry.  Has not been ambulating for the past 3 weeks.  She had been doing stand pivot transfers from bed to bedside commode with assistance from her husband as well as requiring assistance for ADLs.  She has an aide 3 hours a day.  Presented 01/20/2020 with abdominal pain and constipation as well as increasing back pain over the past 3 weeks.  CT abdomen pelvis showed interval development of L1 vertebral plana with marked bony resorption of the L1 vertebral body as well as erosive changes of the inferior T12 endplate and superior L2 endplate.  Overall appearance and rapid progression concerning for underlying discitis osteomyelitis.  No evidence of paraspinal abscess.  CT and MRI thoracic lumbar spine again showed erosive process destruction of much of the L1 vertebral body some involvement of adjacent endplates of J82 and L2.  There was involvement of bilateral psoas muscles with myositis.  There was some  mild retropulsion and phlegmon resulting in spinal stenosis but no severe neural element impingement.  Admission chemistries blood cultures no growth to date, SARS coronavirus negative, creatinine 1.55, hemoglobin 8.8, surgical PCR screen positive.  Patient underwent L1 corpectomy via left lateral retroperitoneal and retropleural approach reconstruction with expandable titanium cage percutaneous segmental instrumentation with pedicle screw fixation T11, T12, L2 and L3 01/22/2020 per Dr. Duffy Rhody.  TLSO back brace recommended applied in supine position follow-up infectious disease Pseudomonas growing in tissue culture recommendations were for cefepime through 03/05/2020 to complete 6-week course.  Subcutaneous Lovenox for DVT prophylaxis.  Hospital course acute on chronic anemia hemoglobin 7.2 and latest creatinine 1.24.  Therapy evaluations completed and patient is to be admitted for a comprehensive rehab program on 01/29/20.     Patient's medical record from [facility] has been reviewed by the rehabilitation admission coordinator and physician.   Past Medical History      Past Medical History:  Diagnosis Date  . Acute MI [facility]) 2007    presented to ED & had cardiac cath- but found to have normal coronaries. Since that point in time her PCP cares f or cardiac needs. Dr. [name] - [facility]  . Anemia    . Anginal pain ([place])    . Anxiety    . Asthma    . Bulging lumbar disc    . Cataract    . 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 Cirrhosis of liver without mention of alcohol    . Constipation    . Dehiscence of closure of skin      left partial calcaneal excision  . Depression    . Diabetes mellitus      insulin dependent, adult onset  . Episode of visual loss of left eye    . Exertional shortness of breath    . Fatty liver    . Fibromyalgia    . GERD (gastroesophageal reflux disease)    . Hepatic  steatosis    . High cholesterol    . Hypertension    . MRSA (methicillin resistant Staphylococcus aureus)    . Neuropathy      lower legs  . Osteoarthritis      hands, hips  . Proximal humerus fracture 10/15/12    Left  . PTSD (  post-traumatic stress disorder)    . Renal insufficiency 05/05/2015      Family History   family history includes Cancer in her father; Colitis in her father; Crohn's disease in her father; Diabetes in her father and mother; Diabetes Mellitus I in her brother; Diabetes Mellitus II in her brother, brother, brother, and brother; Heart attack in her brother; Heart disease in her brother, brother, and father; Hypertension in her mother; Irritable bowel syndrome in her daughter; Kidney disease in her brother, brother, brother, brother, and brother; Leukemia in her father; Liver disease in her brother and brother; Mental illness in her mother.   Prior Rehab/Hospitalizations Has the patient had prior rehab or hospitalizations prior to admission? Yes   Has the patient had major surgery during 100 days prior to admission? Yes              Current Medications   Current Facility-Administered Medications:  .  0.9 % NaCl with KCl 20 mEq/ L  infusion, , Intravenous, Continuous, Vallarie Mare, MD, Stopped at 01/24/20 2016 .  acetaminophen (TYLENOL) tablet 650 mg, 650 mg, Oral, Q6H PRN, Donne Hazel, MD, 650 mg at 01/24/20 2030 .  amLODipine (NORVASC) tablet 5 mg, 5 mg, Oral, Daily, Vallarie Mare, MD, 5 mg at 01/27/20 0825 .  atorvastatin (LIPITOR) tablet 10 mg, 10 mg, Oral, Daily, Vallarie Mare, MD, 10 mg at 01/27/20 0825 .  bisacodyl (DULCOLAX) suppository 10 mg, 10 mg, Rectal, Daily PRN, Vallarie Mare, MD .  ceFEPIme (MAXIPIME) 2 g in sodium chloride 0.9 % 100 mL IVPB, 2 g, Intravenous, Q12H, Alvira Philips, [place], Stopped at 01/27/20 [phone] .  Chlorhexidine Gluconate Cloth 2 % PADS 6 each, 6 each, Topical, Daily, Donne Hazel, MD, 6 each at 01/27/20  [phone] .  diazepam (VALIUM) tablet 5 mg, 5 mg, Oral, Q6H PRN, Vallarie Mare, MD, 5 mg at 01/23/20 1749 .  docusate sodium (COLACE) capsule 100 mg, 100 mg, Oral, BID, Vallarie Mare, MD, 100 mg at 01/27/20 8144 .  DULoxetine (CYMBALTA) DR capsule 60 mg, 60 mg, Oral, Daily, Vallarie Mare, MD, 60 mg at 01/27/20 8185 .  enoxaparin (LOVENOX) injection 40 mg, 40 mg, Subcutaneous, Q24H, Vallarie Mare, MD, 40 mg at 01/27/20 6314 .  feeding supplement (GLUCERNA SHAKE) (GLUCERNA SHAKE) liquid 237 mL, 237 mL, Oral, TID BM, Vallarie Mare, MD, 237 mL at 01/27/20 0827 .  hydrALAZINE (APRESOLINE) injection 10 mg, 10 mg, Intravenous, Q6H PRN, Vallarie Mare, MD .  HYDROmorphone (DILAUDID) injection 0.5 mg, 0.5 mg, Intravenous, Q2H PRN, Vallarie Mare, MD, 0.5 mg at 01/24/20 0604 .  insulin aspart (novoLOG) injection 0-9 Units, 0-9 Units, Subcutaneous, TID WC, Vallarie Mare, MD, 1 Units at 01/27/20 1253 .  insulin glargine (LANTUS) injection 20 Units, 20 Units, Subcutaneous, Daily, Vallarie Mare, MD, 20 Units at 01/27/20 0827 .  insulin glargine (LANTUS) injection 30 Units, 30 Units, Subcutaneous, Q2200, Vallarie Mare, MD, 30 Units at 01/26/20 2147 .  lidocaine (LIDODERM) 5 % 1 patch, 1 patch, Transdermal, Q24H, Vallarie Mare, MD, 1 patch at 01/26/20 2201 .  menthol-cetylpyridinium (CEPACOL) lozenge 3 mg, 1 lozenge, Oral, PRN **OR** phenol (CHLORASEPTIC) mouth spray 1 spray, 1 spray, Mouth/Throat, PRN, Vallarie Mare, MD .  multivitamin with minerals tablet 1 tablet, 1 tablet, Oral, Daily, Vallarie Mare, MD, 1 tablet at 01/27/20 [phone] .  ondansetron (ZOFRAN) tablet 4 mg, 4 mg, Oral, Q6H PRN **OR** ondansetron (ZOFRAN)  injection 4 mg, 4 mg, Intravenous, Q6H PRN, Vallarie Mare, MD .  oxyCODONE (Oxy IR/ROXICODONE) immediate release tablet 10 mg, 10 mg, Oral, Q3H PRN, Vallarie Mare, MD, 10 mg at 01/27/20 1535 .  oxyCODONE (Oxy IR/ROXICODONE) immediate  release tablet 5 mg, 5 mg, Oral, Q3H PRN, Vallarie Mare, MD, 5 mg at 01/23/20 1528 .  polyethylene glycol (MIRALAX / GLYCOLAX) packet 17 g, 17 g, Oral, Daily PRN, Vallarie Mare, MD, 17 g at 01/25/20 1616 .  pregabalin (LYRICA) capsule 150 mg, 150 mg, Oral, BID, Vallarie Mare, MD, 150 mg at 01/27/20 3546 .  senna (SENOKOT) tablet 8.6 mg, 1 tablet, Oral, BID, Vallarie Mare, MD, 8.6 mg at 01/27/20 5681 .  sodium chloride flush (NS) 0.9 % injection 10-40 mL, 10-40 mL, Intracatheter, PRN, Donne Hazel, MD .  sodium chloride flush (NS) 0.9 % injection 3 mL, 3 mL, Intravenous, Q12H, Vallarie Mare, MD, 3 mL at 01/27/20 0829 .  sodium chloride flush (NS) 0.9 % injection 3 mL, 3 mL, Intravenous, PRN, Vallarie Mare, MD .  sodium phosphate (FLEET) 7-19 GM/118ML enema 1 enema, 1 enema, Rectal, Once PRN, Vallarie Mare, MD   Patients Current Diet:     Diet Order                      Diet heart healthy/carb modified Room service appropriate? Yes; Fluid consistency: Thin  Diet effective now                   Precautions / Restrictions Precautions Precautions: Back, Fall Precaution Booklet Issued: Yes (comment) Precaution Comments: Pt instructed in back precautions  Spinal Brace: Thoracolumbosacral orthotic, Applied in supine position Restrictions Weight Bearing Restrictions: No Other Position/Activity Restrictions: TLSO donned in supine, must be worn when [facility] >30deg    Has the patient had 2 or more falls or a fall with injury in the past year? Yes   Prior Activity Level Limited Community (1-2x/wk): has had a decline since back injury in Jan. limited community ambulation; required bed<>chair transfers due to progressive pain. used scooter at times.    Prior Functional Level Self Care: Did the patient need help bathing, dressing, using the toilet or eating? Needed some help ever since injury in Jan    Indoor Mobility: Did the patient need assistance with  walking from room to room (with or without device)? Needed some help ever since injury in Jan-progressing to transfers only   Stairs: Did the patient need assistance with internal or external stairs (with or without device)? Needed some help ever since injury in Jan. Progressed to dependence   Functional Cognition: Did the patient need help planning regular tasks such as shopping or remembering to take medications? Needed some help   Home Assistive Devices / [place] Devices/Equipment: Transport planner Home Equipment: Environmental consultant - 2 wheels, Bedside commode, Electric scooter   Prior Device Use: Indicate devices/aids used by the patient prior to current illness, exacerbation or injury? no AD use prior to back injury; progressed to scooter and bed <> chair transfers    Current Functional Level Cognition   Overall Cognitive Status: Within Functional Limits for tasks assessed Orientation Level: Oriented X4 General Comments: Cognition WFL for tasks assessed     Extremity Assessment (includes Sensation/Coordination)   Upper Extremity Assessment: RUE deficits/detail, LUE deficits/detail RUE Deficits / Details: provided pt with red tubing for builtup handles for self-feeding. pt demonstrates decreased sensation requiring  assistance to located dropped food RUE Sensation: decreased proprioception, decreased light touch RUE Coordination: decreased fine motor, decreased gross motor LUE Deficits / Details: same as RUE LUE Sensation: decreased light touch, decreased proprioception LUE Coordination: decreased fine motor, decreased gross motor  Lower Extremity Assessment: Defer to PT evaluation     ADLs   Overall ADL's : Needs assistance/impaired Eating/Feeding: Sitting, Set up Eating/Feeding Details (indicate cue type and reason): Assist to locate dropped food items and locate items on food tray;pt utilized built up handles for self feeding Grooming: Wash/dry hands, Wash/dry face, Set  up, Bed level Upper Body Bathing: Maximal assistance, Bed level Lower Body Bathing: Total assistance, Bed level Upper Body Dressing : Total assistance, Bed level Lower Body Dressing: Total assistance Lower Body Dressing Details (indicate cue type and reason): totalA to don socks Toilet Transfer: Moderate assistance, Buyer, retail Details (indicate cue type and reason): simulated to/from recliner Toileting- Clothing Manipulation and Hygiene: Total assistance, Bed level Functional mobility during ADLs: Moderate assistance, +2 for physical assistance, +2 for safety/equipment, Rolling walker General ADL Comments: discussed compensatory strategies for self-feeding due to decreased functional use of UE and decreased vision;     Mobility   Overal bed mobility: Needs Assistance Bed Mobility: Rolling, Sidelying to Sit, Sit to Supine Rolling: Min assist, +2 for safety/equipment Sidelying to sit: Mod assist, +2 for physical assistance, +2 for safety/equipment Supine to sit: Mod assist General bed mobility comments: Pt able to assist with rolling. Mod A +2 for rolling to maintain precautions and donning of TLSO in supine. Min A to sit EOB- assist to get trunk upright and scoot anteriorly. Pt requires inc time and explanation of planned movements; she was able to describe an overview of how to don her brace to OT     Transfers   Overall transfer level: Needs assistance Equipment used: Rolling walker (2 wheeled) Transfers: Sit to/from Stand Sit to Stand: Min assist, +2 safety/equipment General transfer comment: Cues for hand placement and safety and min assist to steady; Good rise form slightly elevated bed; cues to self-monitor for activity tolerance     Ambulation / Gait / Stairs / Wheelchair Mobility   Ambulation/Gait Ambulation/Gait assistance: Min assist, +2 safety/equipment Gait Distance (Feet): (Pivotal steps bed to chair) Assistive device: Rolling walker (2 wheeled) Gait  Pattern/deviations: Shuffle General Gait Details: Did not push progressive ambulation due to dizziness with upright activity; limited step to just in front of recliner while we took standing BP, which showed quite a drop, so sat down straightaway     Posture / Balance Dynamic Sitting Balance Sitting balance - Comments: Pt able to sit without UE support if requested. Prefers B UE support. Requires VC to avoid posterior lean Balance Overall balance assessment: Needs assistance Sitting-balance support: Bilateral upper extremity supported, Feet supported Sitting balance-Leahy Scale: Fair Sitting balance - Comments: Pt able to sit without UE support if requested. Prefers B UE support. Requires VC to avoid posterior lean Standing balance-Leahy Scale: Poor     Special needs/care consideration BiPAP/CPAP : not at home PTA, but there is mention of continuing trial of CPAP while in hospital CPM : no Continuous Drip IV : cefepime  Dialysis : no        Days : no Life Vest : no Oxygen : no, on RA Special Bed : no Trach Size : no Wound Vac (area) : no      Location : no Skin : amputation to right toe, MASD to right, left  breast; closed incision to flank, closed incision to back, open diabetic ulcer to left heel.               Bowel mgmt: last BM 01/24/20, continent Bladder mgmt: continent Diabetic mgmt: yes Behavioral consideration : no Chemo/radiation : no    Previous Home Environment (from acute therapy documentation) Living Arrangements: Spouse/significant other Available Help at Discharge: Family, Available PRN/intermittently, Personal care attendant(spouse indicates aide 3 hours/day ) Type of Home: House Home Layout: One level Home Access: Stairs to enter Technical brewer of Steps: 2 Home Care Services: Yes Type of Home Care Services: Homehealth aide   Discharge Living Setting Plans for Discharge Living Setting: Patient's home, Lives with (comment)(lives with husband) Type of Home  at Discharge: House Discharge Home Layout: One level Discharge Home Access: Level entry Discharge Bathroom Shower/Tub: Walk-in shower Discharge Bathroom Toilet: Handicapped height Discharge Bathroom Accessibility: Yes How Accessible: Accessible via wheelchair Does the patient have any problems obtaining your medications?: No   Social/Family/Support Systems Patient Roles: Spouse Contact Information: husband: Marguerite Olea [phone] Anticipated Caregiver: had established aide (57am-12 M-F); husband can be home for lunch 12-1pm, then once again as needed between 1-6pm. Pt will be home alone from 1-6pm (neighbor reportedly can check in during that time as well) Anticipated Caregiver's Contact Information: see above Ability/Limitations of Caregiver: husband: MinA; Aide has been assisting with bathing/dressing at Mod/Max A level Caregiver Availability: Intermittent(Aide (9am-12pm M-F). husband can be home for lunch) Discharge Plan Discussed with Primary Caregiver: Yes(husband) Is Caregiver In Agreement with Plan?: Yes Does Caregiver/Family have Issues with Lodging/Transportation while Pt is in Rehab?: No   Goals/Additional Needs Patient/Family Goal for Rehab: PT/OT: Mod I, intermittant supervision; SLP: NA Expected length of stay: 8-12 days Cultural Considerations: NA Dietary Needs: heart healthy/carb modified thin liquids.  Equipment Needs: TBD Special Service Needs: on IV antibiotics Pt/Family Agrees to Admission and willing to participate: Yes Program Orientation Provided & Reviewed with Pt/Caregiver Including Roles  & Responsibilities: Yes(pt and husband)  Barriers to Discharge: IV antibiotics, Other (comments)  Barriers to Discharge Comments: brace donning TLSO in supine; on IV antoibiotics (per pt, husband is used to assisting with this).    Decrease burden of Care through IP rehab admission: NA   Possible need for SNF placement upon discharge: Not anticipated. Pt has good support her a  hired Engineer, production and her husband has a flexible job where he can run home to check on her during the day as needed. Anticipate pt can reach a Mod I/intermittant supervision level after CIR stay.    Patient Condition: I have reviewed medical records from [facility], spoken with OT, and patient and spouse. I met with patient at the bedside for inpatient rehabilitation assessment.  Patient will benefit from ongoing PT and OT, can actively participate in 3 hours of therapy a day 5 days of the week, and can make measurable gains during the admission.  Patient will also benefit from the coordinated team approach during an Inpatient Acute Rehabilitation admission.  The patient will receive intensive therapy as well as Rehabilitation physician, nursing, social worker, and care management interventions.  Due to safety, skin/wound care, disease management, medication administration, pain management and patient education the patient requires 24 hour a day rehabilitation nursing.  The patient is currently Min A with mobility and basic ADLs.  Discharge setting and therapy post discharge at home with home health is anticipated.  Patient has agreed to participate in the Acute Inpatient Rehabilitation Program and  will admit 01/29/20.   Preadmission Screen Completed By:  Raechel Ache, 01/27/2020 5:14 PM ______________________________________________________________________   Discussed status with Dr. Dagoberto Ligas on 01/29/20 at 2:01PM and received approval for admission today.   Admission Coordinator:  Raechel Ache, OT, time 2:01PM/Date 01/29/20    Assessment/Plan: Diagnosis: 1. Does the need for close, 24 hr/day Medical supervision in concert with the patient's rehab needs make it unreasonable for this patient to be served in a less intensive setting? Yes 2. 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 Due to bladder  management, bowel management, safety, skin/wound care, disease management, medication administration, pain management and patient education, does the patient require 24 hr/day rehab nursing? Yes 4. Does the patient require coordinated care of a physician, rehab nurse, PT, OT, and SLP to address physical and functional deficits in the context of the above medical diagnosis(es)? Yes Addressing deficits in the following areas: balance, endurance, locomotion, strength, transferring, bathing, dressing, feeding, grooming and toileting 5. Can the patient actively participate in an intensive therapy program of at least 3 hrs of therapy 5 days a week? Yes 6. The potential for patient to make measurable gains while on inpatient rehab is fair 7. Anticipated functional outcomes upon discharge from inpatient rehab: supervision PT, supervision OT, n/a SLP 8. Estimated rehab length of stay to reach the above functional goals is: 8-12 days 9. Anticipated discharge destination: Home 10. Overall Rehab/Functional Prognosis: fair     MD Signature:          Revision History

## 2020-01-29 NOTE — Progress Notes (Signed)
PT Progress Note for Charges    01/29/20 1100  PT Visit Information  Last PT Received On 01/29/20  PT General Charges  $$ ACUTE PT VISIT 1 Visit  PT Treatments  $Therapeutic Activity 23-37 mins  Anastasio Champion, DPT  Acute Rehabilitation Services Pager [phone] Office [phone]

## 2020-01-29 NOTE — Progress Notes (Signed)
Physical Therapy Treatment Patient Details Name: [name] [name] MRN: [id number] DOB: 02/10/1956 Today's Date: 01/29/2020    History of Present Illness [name] [name] is a 64 y.o. adult with PMH signficant for diabetes mellitus type 2, chronic kidney disease stage III,  anemia, HTN, anxiety, GERD, fibromyalgia, depression, PTSD, and is legally blind. She was admitted for L1 osteomyelitis and spinal instability. S/p debridement and stabilization T11-12 & L2-3 (3/25)     PT Comments    Patient is making slow progress with functional mobility and activity tolerance. She was able to perform bed mobility with Min A for donning brace and min guard to sit EOB. She was able to tolerate standing longer this session with RW and minA for power up. Pt reported slight dizziness in standing and was able to pivot to chair, BP as follows:   - 146/63 supine in bed - 134/62 seated EOB (dizzy) - 92/55 standing (dizzy) - 141/69 seated in chair  Pt is still motivated to participate in therapy and demonstrates good understanding of back precautions and importance of TLSO. Pt would continue to benefit from skilled physical therapy services at this time while admitted and after d/c to address the below listed limitations in order to improve overall safety and independence with functional mobility.    Follow Up Recommendations  CIR     Equipment Recommendations  Rolling walker with 5" wheels;3in1 (PT)    Recommendations for Other Services       Precautions / Restrictions Precautions Precautions: Back;Fall Precaution Booklet Issued: Yes (comment) Required Braces or Orthoses: Spinal Brace Spinal Brace: Thoracolumbosacral orthotic;Applied in supine position Restrictions Weight Bearing Restrictions: No Other Position/Activity Restrictions: TLSO donned in supine, must be worn when HOB >30deg; watch for Orthostatics; legally blind    Mobility  Bed Mobility Overal bed mobility: Needs Assistance Bed  Mobility: Rolling;Sidelying to Sit Rolling: Min assist Sidelying to sit: Min guard       General bed mobility comments: MinA for rolling and donning TLSO to maintain back precautions. No physical assist for sidelying to sit or scooting to EOB. Pt verbalized understanding of importance of TLSO. Pt reported slight dizziness (see BP's)  Transfers Overall transfer level: Needs assistance Equipment used: Rolling walker (2 wheeled) Transfers: Sit to/from Stand Sit to Stand: Min assist         General transfer comment: VC for proper sequencing and hand placement. Min A for power up. Pt reported dizziness BP dropped to 92/55 and pivoted intochair with minA. BP inc to 141/69 upon sitting in chair.   Ambulation/Gait Ambulation/Gait assistance: Min assist Gait Distance (Feet): (pivoted ~3 steps to chair) Assistive device: Rolling walker (2 wheeled) Gait Pattern/deviations: Shuffle     General Gait Details: Did not progress ambulation due to dizziness and orthostatic BP. Able to pivot to chair where BP returned to baseline.    Stairs             Wheelchair Mobility    Modified Rankin (Stroke Patients Only)       Balance Overall balance assessment: Needs assistance Sitting-balance support: No upper extremity supported;Feet supported Sitting balance-Leahy Scale: Fair     Standing balance support: Bilateral upper extremity supported;During functional activity Standing balance-Leahy Scale: Poor                              Cognition Arousal/Alertness: Awake/alert Behavior During Therapy: WFL for tasks assessed/performed Overall Cognitive Status: Within Functional Limits for tasks  assessed                                        Exercises      General Comments General comments (skin integrity, edema, etc.): BP supine in bed 146/63, seated 134/62 (dizzy), standing 92/55 (dizzy), seated in chair 141/69      Pertinent Vitals/Pain Pain  Assessment: Faces Faces Pain Scale: Hurts even more Pain Location: lumbar region Pain Descriptors / Indicators: Aching;Discomfort;Sore Pain Intervention(s): Monitored during session;Repositioned;Limited activity within patient's tolerance    Home Living                      Prior Function            PT Goals (current goals can now be found in the care plan section) Acute Rehab PT Goals PT Goal Formulation: With patient Time For Goal Achievement: 02/06/20 Potential to Achieve Goals: Fair Progress towards PT goals: Progressing toward goals    Frequency    Min 5X/week      PT Plan Current plan remains appropriate    Co-evaluation              AM-PAC PT "6 Clicks" Mobility   Outcome Measure  Help needed turning from your back to your side while in a flat bed without using bedrails?: A Little Help needed moving from lying on your back to sitting on the side of a flat bed without using bedrails?: A Little Help needed moving to and from a bed to a chair (including a wheelchair)?: A Little Help needed standing up from a chair using your arms (e.g., wheelchair or bedside chair)?: A Little Help needed to walk in hospital room?: A Lot Help needed climbing 3-5 steps with a railing? : Total 6 Click Score: 15    End of Session Equipment Utilized During Treatment: Back brace;Gait belt Activity Tolerance: Other (comment)(limited by orthostatic hypertension) Patient left: in chair;with call bell/phone within reach;with chair alarm set Nurse Communication: Mobility status;Other (comment)(BP status) PT Visit Diagnosis: Muscle weakness (generalized) (M62.81);Other abnormalities of gait and mobility (R26.89);Pain Pain - Right/Left: (lumbar spine) Pain - part of body: (back-lumbar)     Time: 0539-7673 PT Time Calculation (min) (ACUTE ONLY): 26 min  Charges:  $Therapeutic Activity: 23-37 mins                     Cathrine Krizan, SPT Acute Rehab   [phone]   Fischer Halley 01/29/2020, 12:48 PM

## 2020-01-30 ENCOUNTER — Inpatient Hospital Stay (HOSPITAL_COMMUNITY): Payer: PRIVATE HEALTH INSURANCE

## 2020-01-30 DIAGNOSIS — M8608 Acute hematogenous osteomyelitis, other sites: Secondary | ICD-10-CM

## 2020-01-30 DIAGNOSIS — M7989 Other specified soft tissue disorders: Secondary | ICD-10-CM

## 2020-01-30 LAB — GLUCOSE, CAPILLARY
Glucose-Capillary: 115 mg/dL — ABNORMAL HIGH (ref 70–99)
Glucose-Capillary: 142 mg/dL — ABNORMAL HIGH (ref 70–99)
Glucose-Capillary: 203 mg/dL — ABNORMAL HIGH (ref 70–99)
Glucose-Capillary: 262 mg/dL — ABNORMAL HIGH (ref 70–99)

## 2020-01-30 MED ORDER — CHLORHEXIDINE GLUCONATE CLOTH 2 % EX PADS
6.0000 | MEDICATED_PAD | Freq: Every day | CUTANEOUS | Status: DC
  Administered 2020-01-30 – 2020-02-18 (×19): 6 via TOPICAL

## 2020-01-30 MED ORDER — RIVAROXABAN 15 MG PO TABS
15.0000 mg | ORAL_TABLET | Freq: Two times a day (BID) | ORAL | Status: DC
  Administered 2020-01-30 – 2020-02-11 (×24): 15 mg via ORAL
  Filled 2020-01-30 (×24): qty 1

## 2020-01-30 MED ORDER — RIVAROXABAN 20 MG PO TABS: 20.0000 mg | ORAL_TABLET | Freq: Every day | ORAL | Status: DC

## 2020-01-30 NOTE — Progress Notes (Signed)
ANTICOAGULATION CONSULT NOTE - Initial Consult  Pharmacy Consult for Xarelto Indication: DVT  Allergies  Allergen Reactions  . Bee Pollen Anaphylaxis  . Fish-Derived Products Hives, Shortness Of Breath, Swelling and Rash    Hives get in throat causing trouble breathing  . Mushroom Extract Complex Anaphylaxis  . Penicillins Anaphylaxis    **Tolerated cefepime March 2021 Did it involve swelling of the face/tongue/throat, SOB, or low BP? Yes Did it involve sudden or severe rash/hives, skin peeling, or any reaction on the inside of your mouth or nose? No Did you need to seek medical attention at a hospital or doctor's office? Yes When did it last happen?A few months ago If all above answers are "NO", may proceed with cephalosporin use.  [name] Kitchen Rosemary Oil Anaphylaxis  . Shellfish Allergy Hives, Shortness Of Breath, Swelling and Rash  . Tomato Hives and Shortness Of Breath    Hives in throat causes her trouble breathing  . Acetaminophen Other (See Comments)    GI upset  . Acyclovir And Related Other (See Comments)    Unknown rxn  . Aloe Vera Hives  . Broccoli [Brassica Oleracea] Hives  . Naproxen Other (See Comments)    Unknown rxn    Patient Measurements: Wt: 81.7 kg   Vital Signs: Temp: 98.1 F (36.7 C) (04/02 1331) BP: 105/53 (04/02 1331) Pulse Rate: 79 (04/02 1331)  Labs: Recent Labs    01/28/20 0356 01/28/20 0356 01/29/20 0422 01/29/20 1644  HGB 7.5*   < > 7.2* 7.3*  HCT 25.0*  --  23.7* 24.1*  PLT 190  --  205  --   CREATININE 1.28*  --  1.24*  --    < > = values in this interval not displayed.    Estimated Creatinine Clearance (by C-G formula based on SCr of 1.24 mg/dL (H)) Female: 44.4 mL/min (A) Female: 54.6 mL/min (A)   Medical History: Past Medical History:  Diagnosis Date  . Acute MI [facility]) 2007   presented to ED & had cardiac cath- but found to have normal coronaries. Since that point in time her PCP cares f or cardiac needs. Dr. [name] - [facility]  . Anemia   . Anginal pain ([place])   . Anxiety   . Asthma   . Bulging lumbar disc   . Cataract   . 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 Cirrhosis of liver without mention of alcohol   . Constipation   . Dehiscence of closure of skin    left partial calcaneal excision  . Depression   . Diabetes mellitus    insulin dependent, adult onset  . Episode of visual loss of left eye   . Exertional shortness of breath   . Fatty liver   . Fibromyalgia   . GERD (gastroesophageal reflux disease)   . Hepatic steatosis   . High cholesterol   . Hypertension   . MRSA (methicillin resistant Staphylococcus aureus)   . Neuropathy    lower legs  . Osteoarthritis    hands, hips  . Proximal humerus fracture 10/15/12   Left  . PTSD (post-traumatic stress disorder)   . Renal insufficiency 05/05/2015    Medications:  Medications Prior to Admission  Medication Sig Dispense Refill Last Dose  . atorvastatin (LIPITOR) 10 MG tablet Take 1 tablet by mouth once daily 90 tablet 1   . CALCIUM ALGINATE EX Apply 1 patch topically every other day.     [name] Kitchen  ceFEPime (MAXIPIME) IVPB Inject 2 g into the vein every 12 (twelve) hours. Indication:  Hardware associated vertebral infection Last Day of Therapy:  03/05/2020 Labs - Once weekly:  CBC/D and BMP, Labs - Every other week:  ESR and CRP 80 Units 0   . DULoxetine (CYMBALTA) 60 MG capsule Take 1 capsule by mouth once daily (Patient taking differently: Take 60 mg by mouth daily. ) 90 capsule 1   . Ensure Max Protein (ENSURE MAX PROTEIN) LIQD Take 330 mLs (11 oz total) by mouth 2 (two) times daily. 3300 mL 0   . EPINEPHrine 0.3 mg/0.3 mL IJ SOAJ injection Inject into the muscle.     . fluticasone (FLONASE) 50 MCG/ACT nasal spray Place 2 sprays into both nostrils daily. (Patient not taking: Reported on 01/20/2020) 16 g 6   . HUMALOG KWIKPEN 200 UNIT/ML SOPN INJECT 13 UNITS SUBCUTANEOUSLY ONCE DAILY (Patient  taking differently: Inject 13 Units into the skin daily. ) 6 mL 2   . HYDROcodone-acetaminophen (NORCO/VICODIN) 5-325 MG tablet Take 1-2 tablets by mouth every 6 (six) hours as needed for moderate pain or severe pain. 120 tablet 0   . Insulin Glargine (LANTUS SOLOSTAR) 100 UNIT/ML Solostar Pen INJECT 20 UNITS SUBCUTANEOUSLY ONCE DAILY IN THE MORNING AND THEN INJECT 40 UNITS AT BEDTIME (Patient taking differently: Inject 20-40 Units into the skin 2 (two) times daily. INJECT 20 UNITS SUBCUTANEOUSLY ONCE DAILY IN THE MORNING AND THEN INJECT 40 UNITS AT BEDTIME) 45 mL 0   . lidocaine (LIDODERM) 5 % Place 1 patch onto the skin daily. Remove & Discard patch within 12 hours or as directed by MD 30 patch 0   . Multiple Vitamins-Minerals (MULTIVITAMIN GUMMIES WOMENS) CHEW Chew 2 each by mouth daily.     . Naftifine HCl 2 % CREA Apply qd for up to 4 weeks (Patient not taking: Reported on 01/20/2020) 60 g 2   . ondansetron (ZOFRAN ODT) 4 MG disintegrating tablet Take 1 tablet (4 mg total) by mouth every 8 (eight) hours as needed for nausea or vomiting. 20 tablet 0   . ondansetron (ZOFRAN ODT) 4 MG disintegrating tablet Take 1 tablet (4 mg total) by mouth every 4 (four) hours as needed for nausea or vomiting. (Patient not taking: Reported on 01/20/2020) 20 tablet 0   . polyethylene glycol (MIRALAX / GLYCOLAX) packet Take 17 g by mouth daily as needed for mild constipation.      . pregabalin (LYRICA) 150 MG capsule Take 1 capsule (150 mg total) by mouth 2 (two) times daily. 180 capsule 1   . promethazine (PHENERGAN) 25 MG suppository Place 1 suppository (25 mg total) rectally every 6 (six) hours as needed for nausea or vomiting. (Patient not taking: Reported on 01/20/2020) 12 each 0     Assessment: 77 YOF with new bilateral DVTs. Pharmacy consulted to start Xarelto. H/H low stable, Plt wnl. Of note, patient was on SQ lovenox 40 mg daily. Last dose was this AM   SCr 1.24    Goal of Therapy:  DVT treatment Monitor  platelets by anticoagulation protocol: Yes   Plan:  -D/c SQ Lovenox  -Start Xarelto 15 mg twice daily x 21 days, then transition to Xarelto 20 mg daily.  -Monitor CBC, renal fx and s/s of bleeding  -Will need Xarelto education prior to discharge.   [name] Parr, PharmD., BCPS Clinical Pharmacist Clinical phone for 01/30/20 until 11pm: [phone] If after 11pm, please refer to [facility] for unit-specific pharmacist

## 2020-01-30 NOTE — Progress Notes (Signed)
[facility] PHYSICAL MEDICINE & REHABILITATION PROGRESS NOTE   Subjective/Complaints: Pt reports had 4 BMs yesterday- feels like needs to decrease bowel meds.  Insisting she needs private room.  Due to her PTSD- feeling claustrophobic.  And her vision is bad, so cannot see well without sunlight.    ROS:  Pt denies SOB, abd pain, CP, N/V/C/D, and vision changes  Objective:   No results found. Recent Labs    01/28/20 0356 01/28/20 0356 01/29/20 0422 01/29/20 1644  WBC 5.3  --  6.5  --   HGB 7.5*   < > 7.2* 7.3*  HCT 25.0*   < > 23.7* 24.1*  PLT 190  --  205  --    < > = values in this interval not displayed.   Recent Labs    01/28/20 0356 01/29/20 0422  NA 138 138  K 3.6 3.8  CL 106 103  CO2 24 25  GLUCOSE 138* 238*  BUN 13 13  CREATININE 1.28* 1.24*  CALCIUM 8.0* 7.9*    Intake/Output Summary (Last 24 hours) at 01/30/2020 1040 Last data filed at 01/30/2020 0700 Gross per 24 hour  Intake 422 ml  Output -  Net 422 ml     Physical Exam: Vital Signs Blood pressure (!) 127/54, pulse 77, temperature 97.9 F (36.6 C), resp. rate 17, SpO2 98 %.  Physical Exam:  Blood pressure (!) 118/56, pulse 75, temperature 98.6 F (37 C), temperature source Oral, resp. rate 20, height 5' 1"  (1.549 m), weight 81.7 kg, SpO2 96 %.  Physical Exam  Nursing note and vitals reviewed.  Constitutional: awake, alert, sitting up in manual w/c in hallway, NAD HENT: stares into space due to legally blind; conjugate gaze ,however  Cardiovascular: RRR  Respiratory: CTA B/L- good air movement  GI: soft, NT; ND, (+)BS  Musculoskeletal:  Cervical back: Normal range of motion and neck supple.  Comments: RUE- biceps 4/5, triceps 4/5, WE 4/5, grip 4-/5, finger abd 3+/5 LUE- biceps 4+/5, triceps 4+/5, WE 5-/5, grip 4+/5, finger abd 4+/5 (hx of R elbow nerve injury/5 surgeries- with associated scars) RLE- HF 4+/5, KE/KF 5-/5, DF/PF 5-/5 LLE_ HF 4+/5, KE/KF 5-/5, DF/PF 5-/5 Missing R 5th ray of  foot- surgical- with scar  Neurological: Ox3;  Skin:  Back incision site clean and dry. No erythema, some dried blood seen around incision L heel ulcer- bandaged C/D/I Healed scars on R elbow and R foot Wearing off the shelf TLSO Psychiatric: slightly anxious   Assessment/Plan: 1. Functional deficits secondary to osteomyelitis of vertebral of thoracolumbar region. Status post L1 corpectomy with pedicle screw fixation T11, 12, L2 and L3 01/22/2020 which require 3+ hours per day of interdisciplinary therapy in a comprehensive inpatient rehab setting.  Physiatrist is providing close team supervision and 24 hour management of active medical problems listed below.  Physiatrist and rehab team continue to assess barriers to discharge/monitor patient progress toward functional and medical goals  Care Tool:  Bathing              Bathing assist       Upper Body Dressing/Undressing Upper body dressing   What is the patient wearing?: Hospital gown only    Upper body assist Assist Level: Minimal Assistance - Patient > 75%    Lower Body Dressing/Undressing Lower body dressing      What is the patient wearing?: Incontinence brief     Lower body assist Assist for lower body dressing: Minimal Assistance - Patient > 75%  Toileting Toileting    Toileting assist Assist for toileting: Minimal Assistance - Patient > 75%     Transfers Chair/bed transfer  Transfers assist           Locomotion Ambulation   Ambulation assist              Walk 10 feet activity   Assist           Walk 50 feet activity   Assist           Walk 150 feet activity   Assist           Walk 10 feet on uneven surface  activity   Assist           Wheelchair     Assist               Wheelchair 50 feet with 2 turns activity    Assist            Wheelchair 150 feet activity     Assist          Blood pressure (!) 127/54, pulse  77, temperature 97.9 F (36.6 C), resp. rate 17, SpO2 98 %.  Medical Problem List and Plan:  1. Decreased functional mobility secondary to osteomyelitis of vertebral of thoracolumbar region. Status post L1 corpectomy with pedicle screw fixation T11, 12, L2 and L3 01/22/2020. TLSO back brace donned and supine.  -patient may Shower- cover incision- might need 2 TLSOs  -ELOS/Goals: ~ 8-12 days- goals supervision to min assist  2. Antithrombotics:  -DVT/anticoagulation: Lovenox. Check vascular study  -antiplatelet therapy: N/A  3. Pain Management: Lyrica 150 mg twice daily, Lidoderm patch, Valium as needed muscle spasms as well as oxycodone as needed  4. Mood: Cymbalta 60 mg daily  -antipsychotic agents: N/A  5. Neuropsych: This patient is capable of making decisions on her own behalf.  6. Skin/Wound Care: Routine skin checks  7. Fluids/Electrolytes/Nutrition: Routine in and outs with follow-up chemistries  8. ID. 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 Follow-up per infectious disease  9. Diabetes mellitus. Lantus insulin as directed.    CBG (last 3)  Recent Labs    01/29/20 1744 01/29/20 2115 01/30/20 0613  GLUCAP 208* 258* 115*    4/2- BGs 115-258- on home lantus 30 BID- will monitor for trend and change as required 10. CKD stage III. Follow-up chemistries  4/2- Cr stable at 1.24- recheck Monday 11. 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 Wound care left foot as directed. Follow-up per Dr. Sharol Given  12. Acute on chronic anemia. Follow-up CBC   4/3- Hb 7.3- might need transfusion- will monitor 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 Patient did have assistance at home. Has "seeing eye dog" and husband  61. Constipation. Continue Senokot. MiraLAX as needed. No nausea vomiting   4/2- 4 BMs yesterday-will stop Colace BID and con't senokot BID 15. R elbow nerve damage/s/p 5 surgeries - will con't to monitor      LOS: 1 days A FACE TO FACE EVALUATION WAS PERFORMED  Samuel Mcpeek 01/30/2020, 10:40 AM
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
## 2020-01-30 NOTE — Plan of Care (Signed)

## 2020-01-30 NOTE — Progress Notes (Signed)
Bilateral lower extremity venous duplex exam completed.  Preliminary results can be found under CV proc under chart review.  01/30/2020 4:14 PM  Jaysha Lasure, K., RDMS, RVT

## 2020-01-30 NOTE — Progress Notes (Signed)
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  Results discussed with neurosurgery/Dr. Ellene Route.  Patient presently on Lovenox 40 mg daily and he does at this time recommend Xarelto which has been initiated.

## 2020-01-30 NOTE — Progress Notes (Signed)
Inpatient Rehabilitation  Patient information reviewed and entered into eRehab system by Marvelle Caudill M. Casyn Becvar, M.A., CCC/SLP, PPS Coordinator.  Information including medical coding, functional ability and quality indicators will be reviewed and updated through discharge.    

## 2020-01-31 ENCOUNTER — Inpatient Hospital Stay (HOSPITAL_COMMUNITY): Payer: PRIVATE HEALTH INSURANCE | Admitting: Physical Therapy

## 2020-01-31 ENCOUNTER — Inpatient Hospital Stay (HOSPITAL_COMMUNITY): Payer: PRIVATE HEALTH INSURANCE

## 2020-01-31 DIAGNOSIS — M4625 Osteomyelitis of vertebra, thoracolumbar region: Secondary | ICD-10-CM

## 2020-01-31 DIAGNOSIS — Z794 Long term (current) use of insulin: Secondary | ICD-10-CM

## 2020-01-31 DIAGNOSIS — M8600 Acute hematogenous osteomyelitis, unspecified site: Secondary | ICD-10-CM

## 2020-01-31 DIAGNOSIS — E1165 Type 2 diabetes mellitus with hyperglycemia: Secondary | ICD-10-CM

## 2020-01-31 LAB — GLUCOSE, CAPILLARY
Glucose-Capillary: 157 mg/dL — ABNORMAL HIGH (ref 70–99)
Glucose-Capillary: 146 mg/dL — ABNORMAL HIGH (ref 70–99)
Glucose-Capillary: 157 mg/dL — ABNORMAL HIGH (ref 70–99)
Glucose-Capillary: 223 mg/dL — ABNORMAL HIGH (ref 70–99)

## 2020-01-31 MED ORDER — SODIUM CHLORIDE 0.9% FLUSH
10.0000 mL | Freq: Two times a day (BID) | INTRAVENOUS | Status: DC
  Administered 2020-02-01: 22:00:00 30 mL
  Administered 2020-02-02 – 2020-02-11 (×9): 10 mL
  Administered 2020-02-14: 30 mL
  Administered 2020-02-15 – 2020-02-17 (×4): 10 mL

## 2020-01-31 MED ORDER — SODIUM CHLORIDE 0.9% FLUSH
10.0000 mL | INTRAVENOUS | Status: DC | PRN
  Administered 2020-01-31 – 2020-02-14 (×3): 10 mL

## 2020-01-31 MED ORDER — INSULIN GLARGINE 100 UNIT/ML ~~LOC~~ SOLN
32.0000 [IU] | Freq: Two times a day (BID) | SUBCUTANEOUS | Status: DC
  Administered 2020-01-31 – 2020-02-03 (×6): 32 [IU] via SUBCUTANEOUS
  Filled 2020-01-31 (×7): qty 0.32

## 2020-01-31 NOTE — Progress Notes (Signed)
Physical Therapy Session Note  Patient Details  Name: [name] [name] MRN: [id number] Date of Birth: 1956/07/13  Today's Date: 01/31/2020 PT Individual Time: 8826-6664 PT Individual Time Calculation (min): 56 min   Short Term Goals: Week 1:  PT Short Term Goal 1 (Week 1): STG=LTGs  Skilled Therapeutic Interventions/Progress Updates:   Pt received sitting in WC and agreeable to PT. Pt transported to rehab gym. Sit<>stand from [facility] with CGA-supervision assist throughout treatment. Pt initially assesed for orthostatic hypotension, but no reports for any s/s throughout treatment. Gait training with RW x 71f +[id number] CGA for safety. Min cues for step length and AD management in turns.   Dynamic balance training to perform static standing with 2-1 UE support x 3 minutes and then cross body/fiorward reaches while standing on airex pad; min assist prevent posterior LOB and moderate cues for ankle strategy to improve anterior weight shift. 2 bouts x 4 Bil and 6 bil. Prolonged rest break between bouts required due to increased anxiety and LE fatigue.   [facility] and all needs met.           Therapy Documentation Precautions:  Precautions Precautions: Back, Fall, Other (comment)(has had issues w/orthostatic hypotension) Precaution Comments: Pt instructed in back precautions, and to self-monitor for activity tolerance, Required Braces or Orthoses: Spinal Brace Spinal Brace: Thoracolumbosacral orthotic, Applied in supine position Restrictions Weight Bearing Restrictions: No Pain: Pain Assessment Pain Scale: 0-10 Pain Score: 7  Pain Type: Surgical pain Pain Location: Back Pain Orientation: Lower;Medial Pain Descriptors / Indicators: Aching;Dull;Discomfort Pain Frequency: Constant Pain Onset: On-going Patients Stated Pain Goal: 3 Pain Intervention(s): Repositioned    Therapy/Group: Individual Therapy  AuLorie Phenix/12/2019, 11:05 AM

## 2020-01-31 NOTE — Progress Notes (Signed)
[facility] PHYSICAL MEDICINE & REHABILITATION PROGRESS NOTE   Subjective/Complaints: No new c/o. Mentioned her DVT. Pain controlled  ROS: Patient denies fever, rash, sore throat, blurred vision, nausea, vomiting, diarrhea, cough, shortness of breath or chest pain,  headache, or mood change.    Objective:   VAS [name] LOWER EXTREMITY VENOUS (DVT)  Result Date: 01/30/2020  Lower Venous DVTStudy Indications: Swelling.  Comparison Study: No prior exam. Performing Technologist: Baldwin Crown ARDMS, RVT  Examination Guidelines: A complete evaluation includes B-mode imaging, spectral Doppler, color Doppler, and power Doppler as needed of all accessible portions of each vessel. Bilateral testing is considered an integral part of a complete examination. Limited examinations for reoccurring indications may be performed as noted. The reflux portion of the exam is performed with the patient in reverse Trendelenburg.  +--------+---------------+---------+-----------+----------+--------------------+ RIGHT   CompressibilityPhasicitySpontaneityPropertiesThrombus Aging       +--------+---------------+---------+-----------+----------+--------------------+ CFV     Full           Yes      Yes                                       +--------+---------------+---------+-----------+----------+--------------------+ SFJ     Full                                                              +--------+---------------+---------+-----------+----------+--------------------+ FV Prox Full                                                              +--------+---------------+---------+-----------+----------+--------------------+ FV Mid  Full                                                              +--------+---------------+---------+-----------+----------+--------------------+ FV      Full                                                              Distal                                                                     +--------+---------------+---------+-----------+----------+--------------------+ PFV     Full                                                              +--------+---------------+---------+-----------+----------+--------------------+  POP     Full           Yes      Yes                                       +--------+---------------+---------+-----------+----------+--------------------+ PTV     None                                         no blood flow                                                             visualized           +--------+---------------+---------+-----------+----------+--------------------+ PERO    None                                         no blood flow                                                             visualized           +--------+---------------+---------+-----------+----------+--------------------+   +--------+---------------+---------+-----------+----------+--------------------+ LEFT    CompressibilityPhasicitySpontaneityPropertiesThrombus Aging       +--------+---------------+---------+-----------+----------+--------------------+ CFV     Full           Yes      Yes                                       +--------+---------------+---------+-----------+----------+--------------------+ SFJ     Full                                                              +--------+---------------+---------+-----------+----------+--------------------+ FV Prox Full                                                              +--------+---------------+---------+-----------+----------+--------------------+ FV Mid  Full                                                              +--------+---------------+---------+-----------+----------+--------------------+ FV      Full  Distal                                                                     +--------+---------------+---------+-----------+----------+--------------------+ PFV     Full                                                              +--------+---------------+---------+-----------+----------+--------------------+ POP     Full           Yes      Yes                                       +--------+---------------+---------+-----------+----------+--------------------+ PTV                                                  no blood flow                                                             visualized           +--------+---------------+---------+-----------+----------+--------------------+ PERO                                                 no blood flow                                                             visualized           +--------+---------------+---------+-----------+----------+--------------------+     Summary: RIGHT: - Findings consistent with acute deep vein thrombosis involving the right peroneal veins, and right posterior tibial veins. - No cystic structure found in the popliteal fossa.  LEFT: - Findings consistent with acute deep vein thrombosis involving the left peroneal veins, and left posterior tibial veins. - No cystic structure found in the popliteal fossa.  *See table(s) above for measurements and observations.    Preliminary    Recent Labs    01/29/20 0422 01/29/20 1644  WBC 6.5  --   HGB 7.2* 7.3*  HCT 23.7* 24.1*  PLT 205  --    Recent Labs    01/29/20 0422  NA 138  K 3.8  CL 103  CO2 25  GLUCOSE 238*  BUN 13  CREATININE 1.24*  CALCIUM 7.9*    Intake/Output Summary (Last 24 hours) at 01/31/2020 1129 Last  data filed at 01/30/2020 1839 Gross per 24 hour  Intake 766 ml  Output --  Net 766 ml     Physical Exam: Vital Signs Blood pressure (!) 94/51, pulse 73, temperature 97.8 F (36.6 C), resp. rate 14, SpO2 97 %.  Constitutional: No distress . Vital signs  reviewed. HEENT: EOMI, oral membranes moist Neck: supple Cardiovascular: RRR without murmur. No JVD    Respiratory/Chest: CTA Bilaterally without wheezes or rales. Normal effort    GI/Abdomen: BS +, non-tender, non-distended Ext: no clubbing, cyanosis, or edema Psych: pleasant and cooperative Musculoskeletal:  Cervical back: Normal range of motion and neck supple.  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 Functional deficits secondary to osteomyelitis of vertebral of thoracolumbar region. Status post L1 corpectomy with pedicle screw fixation T11, 12, L2 and L3 01/22/2020 which require 3+ hours per day of interdisciplinary therapy in a comprehensive inpatient rehab setting.  Physiatrist is providing close team supervision and 24 hour management of active medical problems listed below.  Physiatrist and rehab team continue to assess barriers to discharge/monitor patient progress toward functional and medical goals  Care Tool:  Bathing    Body parts bathed by patient: Right arm, Left arm, Chest, Abdomen, Front perineal area, Right upper leg, Left upper leg, Right lower leg, Left lower leg, Face   Body parts bathed by helper: Buttocks     Bathing assist Assist Level: Set up assist     Upper Body Dressing/Undressing Upper body dressing   What is the patient wearing?: Pull over shirt Orthosis activity level: Performed by helper  Upper body assist Assist Level: Minimal Assistance - Patient > 75%    Lower Body  Dressing/Undressing Lower body dressing      What is the patient wearing?: Underwear/pull up, Pants     Lower body assist Assist for lower body dressing: Minimal Assistance - Patient > 75%     Toileting Toileting    Toileting assist Assist for toileting: Moderate Assistance - Patient 50 - 74%     Transfers Chair/bed transfer  Transfers assist     Chair/bed transfer assist level: Contact Guard/Touching assist     Locomotion Ambulation   Ambulation assist      Assist level: Contact Guard/Touching assist Assistive device: Walker-rolling Max distance: 130   Walk 10 feet activity   Assist     Assist level: Contact Guard/Touching assist Assistive device: Walker-rolling   Walk 50 feet activity   Assist Walk 50 feet with 2 turns activity did not occur: Safety/medical concerns  Assist level: Contact Guard/Touching assist Assistive device: Walker-rolling    Walk 150 feet activity   Assist Walk 150 feet activity did not occur: Safety/medical concerns         Walk 10 feet on uneven surface  activity   Assist     Assist level: Moderate Assistance - Patient - 50 - 74% Assistive device: Aeronautical engineer Will patient use wheelchair at discharge?: No  Wheelchair 50 feet with 2 turns activity    Assist            Wheelchair 150 feet activity     Assist          Blood pressure (!) 94/51, pulse 73, temperature 97.8 F (36.6 C), resp. rate 14, SpO2 97 %.  Medical Problem List and Plan:  1. Decreased functional mobility secondary to osteomyelitis of vertebral of thoracolumbar region. Status post L1 corpectomy with pedicle screw fixation T11, 12, L2 and L3 01/22/2020. TLSO back brace donned and supine.  -patient may Shower- cover incision- might need 2 TLSOs  -ELOS/Goals: ~ 8-12 days- goals supervision to min assist  2. Antithrombotics:  -DVT/anticoagulation: bilateral post-tib and peroneal  DVT's.    -xarelto initiated yesterday   4/3 -reviewed with patient today -antiplatelet therapy: N/A  3. Pain Management: Lyrica 150 mg twice daily, Lidoderm patch, Valium as needed muscle spasms as well as oxycodone as needed  4. Mood: Cymbalta 60 mg daily  -antipsychotic agents: N/A  5. Neuropsych: This patient is capable of making decisions on her own behalf.  6. Skin/Wound Care: Routine skin checks  7. Fluids/Electrolytes/Nutrition: Routine in and outs with follow-up chemistries  8. ID. 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 Follow-up per infectious disease  9. Diabetes mellitus. Lantus insulin as directed.    CBG (last 3)  Recent Labs    01/30/20 1652 01/30/20 2132 01/31/20 0628  GLUCAP 203* 262* 157*    4/2- BGs 115-258- on home lantus 30 BID- will monitor for trend and change as required  4/3 continued elevation   -increase lantus to 32u bid 10. CKD stage III. Follow-up chemistries  4/2- Cr stable at 1.24- recheck Monday 11. 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 Wound care left foot as directed. Follow-up per Dr. Sharol Given  12. Acute on chronic anemia. Follow-up CBC   4/3- Hb 7.3- might need transfusion- will monitor 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 Patient did have assistance at home. Has "seeing eye dog" and husband  63. Constipation. Continue Senokot. MiraLAX as needed. No nausea vomiting   4/2- 4 BMs yesterday-will stop Colace BID and con't senokot BID  4/3 one formed bm yesterday and today so far 15. R elbow nerve damage/s/p 5 surgeries - will con't to monitor     LOS: 2 days A FACE TO FACE EVALUATION WAS PERFORMED  Meredith Staggers 01/31/2020, 11:29 AM

## 2020-01-31 NOTE — Progress Notes (Signed)
Occupational Therapy Session Note  Patient Details  Name: [name] [name] MRN: [id number] Date of Birth: 20-Sep-1956  Today's Date: 01/31/2020 OT Individual Time: 7579-7282 OT Individual Time Calculation (min): 75 min    Short Term Goals: Week 1:  OT Short Term Goal 1 (Week 1): STG=LTG d/t ELOS  Skilled Therapeutic Interventions/Progress Updates:    OT session focused on functional transfers, ADL retraining, and adherence to back precautions. Pt received supine in bed requesting to toilet. Donned TLSO while supine then completed supine>sit and SPT bed<>bedside commode with min A. Pt required mod assist for toileting following BM due to unable to complete hygiene with precautions. Completed sponge bath while supine in bed and sitting EOB with min A/CGA for sit<>stand and assist to complete back and feet. Client required assist for threading clothing with OT educating on crossover technique. At end of session, client left seated in w/c with all needs in reach.   Therapy Documentation Precautions:  Precautions Precautions: Back, Fall, Other (comment)(has had issues w/orthostatic hypotension) Precaution Comments: Pt instructed in back precautions, and to self-monitor for activity tolerance, Required Braces or Orthoses: Spinal Brace Spinal Brace: Thoracolumbosacral orthotic, Applied in supine position Restrictions Weight Bearing Restrictions: No General:   Vital Signs:  Pain: Pain Assessment Pain Scale: 0-10 Pain Score: 7  Pain Type: Surgical pain Pain Location: Back Pain Orientation: Lower;Medial Pain Descriptors / Indicators: Aching;Dull;Discomfort Pain Frequency: Constant Pain Onset: On-going Patients Stated Pain Goal: 3 Pain Intervention(s): Repositioned ADL: ADL Eating: Set up Where Assessed-Eating: Wheelchair Grooming: Setup Where Assessed-Grooming: Edge of bed Upper Body Bathing: Supervision/safety Where Assessed-Upper Body Bathing: Bed level Lower Body Bathing:  Minimal assistance Where Assessed-Lower Body Bathing: Edge of bed Upper Body Dressing: Moderate assistance(shirt and TLSO in supine) Where Assessed-Upper Body Dressing: Bed level Lower Body Dressing: Minimal assistance Where Assessed-Lower Body Dressing: Edge of bed Toileting: Moderate assistance Where Assessed-Toileting: Bedside Commode Toilet Transfer: Minimal assistance Toilet Transfer Method: Stand pivot Vision   Perception    Praxis   Exercises:   Other Treatments:     Therapy/Group: Individual Therapy  Duayne Cal 01/31/2020, 12:16 PM

## 2020-01-31 NOTE — Progress Notes (Signed)
Physical Therapy Session Note  Patient Details  Name: [name] [name] MRN: [id number] Date of Birth: 1956/05/31  Today's Date: 01/31/2020 PT Individual Time: 1635-1720 PT Individual Time Calculation (min): 45 min   Short Term Goals: Week 1:  PT Short Term Goal 1 (Week 1): STG=LTGs  Skilled Therapeutic Interventions/Progress Updates:   Pt received sitting in WC and agreeable to PT. Pt reports mild stiffness in BLE. Pt transported to day room. Stand pivot transfer to nustep. Reciprocal movement training, endurance training, BLE only, level 3>4, x 9 minutes with cues for improved PL positioning and increased ROM as tolerated. Stand pivot transfer to [facility] with supervision assist and cues for UE placement to control descent into WC. Gait training in rehab gym x 150f with RW and CGA for safety. Cues for direction due to visual deficits throughout to avoid doorways and obstacles in the floor.blockewd practice sit<>stand x 10 with supervision assist and min cues for UE placement to improve set up.  [facility] and all needs met.           Therapy Documentation Precautions:  Precautions Precautions: Back, Fall, Other (comment)(has had issues w/orthostatic hypotension) Precaution Comments: Pt instructed in back precautions, and to self-monitor for activity tolerance, Required Braces or Orthoses: Spinal Brace Spinal Brace: Thoracolumbosacral orthotic, Applied in supine position Restrictions Weight Bearing Restrictions: No    Vital Signs: Therapy Vitals Temp: 98.3 F (36.8 C) Temp Source: Oral Pulse Rate: 80 Resp: 20 BP: 122/61 Patient Position (if appropriate): Sitting Oxygen Therapy SpO2: 100 % O2 Device: Room Air Pain: Pain Assessment Pain Score: 4    Therapy/Group: Individual Therapy  ALorie Phenix4/12/2019, 5:23 PM

## 2020-02-01 LAB — GLUCOSE, CAPILLARY
Glucose-Capillary: 118 mg/dL — ABNORMAL HIGH (ref 70–99)
Glucose-Capillary: 96 mg/dL (ref 70–99)
Glucose-Capillary: 149 mg/dL — ABNORMAL HIGH (ref 70–99)
Glucose-Capillary: 197 mg/dL — ABNORMAL HIGH (ref 70–99)

## 2020-02-01 NOTE — IPOC Note (Signed)
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[place])   CAD (coronary artery disease)   Obesity (BMI 30-39.9)   Type 2 diabetes mellitus with hyperglycemia, with long-term current use of insulin (HCC)   Osteomyelitis of vertebra of thoracolumbar region [facility])     Functional Problem List: Nursing    PT Balance, Endurance, Motor, Pain  OT Balance, Cognition, Endurance, Motor, Pain, Safety  SLP    TR         Basic ADL's: OT Grooming, Bathing, Dressing, Toileting     Advanced  ADL's: OT       Transfers: PT Bed Mobility, Bed to Chair, Car, Manufacturing systems engineer, Metallurgist: PT Ambulation, Emergency planning/management officer, Stairs     Additional Impairments: OT None  SLP        TR      Anticipated Outcomes Item Anticipated Outcome  Self Feeding set up  Swallowing      Basic self-care  S dressing (A for brace)  Toileting  MOD I toileting; S shower   Bathroom Transfers MOD I toileting; S shower  Bowel/Bladder     Transfers  mod I  Locomotion  mod I  Communication     Cognition     Pain     Safety/Judgment      Therapy Plan: PT Intensity: Minimum of 1-2 x/day ,45 to 90 minutes PT Frequency: 5 out of 7 days PT Duration Estimated Length of Stay: 10 days OT Intensity: Minimum of 1-2 x/day, 45 to 90 minutes OT Frequency: 5 out of 7 days OT Duration/Estimated Length of Stay: 7-10     Due to the current state of emergency, patients may not be receiving their 3-hours of Medicare-mandated therapy.   Team Interventions: Nursing Interventions    PT interventions Ambulation/gait training, Discharge planning, Functional mobility training, Therapeutic Activities, Psychosocial support, Balance/vestibular training, Disease management/prevention, Neuromuscular  re-education, Therapeutic Exercise, Wheelchair propulsion/positioning, DME/adaptive equipment instruction, Pain management, UE/LE Strength taining/ROM, Patient/family education, Stair training, UE/LE Coordination activities  OT Interventions Balance/vestibular training, Discharge planning, Pain management, Self Care/advanced ADL retraining, Therapeutic Activities, Disease mangement/prevention, Functional mobility training, Patient/family education, Skin care/wound managment, UE/LE Coordination activities, Therapeutic Exercise, Wheelchair propulsion/positioning, Community reintegration, Engineer, drilling, Neuromuscular re-education, Psychosocial support, Splinting/orthotics, UE/LE Strength taining/ROM  SLP Interventions    TR Interventions    SW/CM Interventions Psychosocial Support, Patient/Family Education, Discharge Planning   Barriers to Discharge MD  Medical stability and Behavior  Nursing      PT Other (comments)(spinal precautions)    OT Decreased caregiver support husband works  SLP      SW Decreased caregiver support, Lack of/limited family support     Team Discharge Planning: Destination: PT-Home ,OT- Home , SLP-  Projected Follow-up: PT-Home health PT, OT-  Home health OT, SLP-  Projected Equipment Needs: PT-To be determined, OT- None recommended by OT, SLP-  Equipment Details: PT- , OT-  Patient/family involved in discharge planning: PT- Patient,  OT-Patient, SLP-   MD ELOS: 7-10 days Medical Rehab Prognosis:  Excellent Assessment: The patient has been admitted for CIR therapies with the diagnosis of thoracolumbar osteomyelitis s/p corpectomy/fusion. The team will be addressing functional mobility, strength, stamina, balance, safety, adaptive techniques and equipment, self-care, bowel and bladder mgt, patient and caregiver education, NMR, community reintegration, pain control. Goals have been set  at mod I for mobility and self-care.   Due to the current state  of emergency, patients may not be receiving their 3 hours per day of Medicare-mandated therapy.    Meredith Staggers, MD, FAAPMR      See Team Conference Notes for weekly updates to the plan of care

## 2020-02-01 NOTE — Progress Notes (Signed)
[facility] PHYSICAL MEDICINE & REHABILITATION PROGRESS NOTE   Subjective/Complaints: Feeling fairly well. Had a good night of sleep. Pain seems controlled  ROS: Patient denies fever, rash, sore throat, blurred vision, nausea, vomiting, diarrhea, cough, shortness of breath or chest pain,   headache, or mood change.     Objective:   VAS [name] LOWER EXTREMITY VENOUS (DVT)  Result Date: 02/01/2020  Lower Venous DVTStudy Indications: Swelling.  Comparison Study: No prior exam. Performing Technologist: Baldwin Crown ARDMS, RVT  Examination Guidelines: A complete evaluation includes B-mode imaging, spectral Doppler, color Doppler, and power Doppler as needed of all accessible portions of each vessel. Bilateral testing is considered an integral part of a complete examination. Limited examinations for reoccurring indications may be performed as noted. The reflux portion of the exam is performed with the patient in reverse Trendelenburg.  +--------+---------------+---------+-----------+----------+--------------------+ RIGHT   CompressibilityPhasicitySpontaneityPropertiesThrombus Aging       +--------+---------------+---------+-----------+----------+--------------------+ CFV     Full           Yes      Yes                                       +--------+---------------+---------+-----------+----------+--------------------+ SFJ     Full                                                              +--------+---------------+---------+-----------+----------+--------------------+ FV Prox Full                                                              +--------+---------------+---------+-----------+----------+--------------------+ FV Mid  Full                                                              +--------+---------------+---------+-----------+----------+--------------------+ FV      Full                                                              Distal                                                                     +--------+---------------+---------+-----------+----------+--------------------+ PFV     Full                                                              +--------+---------------+---------+-----------+----------+--------------------+  POP     Full           Yes      Yes                                       +--------+---------------+---------+-----------+----------+--------------------+ PTV     None                                         no blood flow                                                             visualized           +--------+---------------+---------+-----------+----------+--------------------+ PERO    None                                         no blood flow                                                             visualized           +--------+---------------+---------+-----------+----------+--------------------+   +--------+---------------+---------+-----------+----------+--------------------+ LEFT    CompressibilityPhasicitySpontaneityPropertiesThrombus Aging       +--------+---------------+---------+-----------+----------+--------------------+ CFV     Full           Yes      Yes                                       +--------+---------------+---------+-----------+----------+--------------------+ SFJ     Full                                                              +--------+---------------+---------+-----------+----------+--------------------+ FV Prox Full                                                              +--------+---------------+---------+-----------+----------+--------------------+ FV Mid  Full                                                              +--------+---------------+---------+-----------+----------+--------------------+ FV      Full  Distal                                                                     +--------+---------------+---------+-----------+----------+--------------------+ PFV     Full                                                              +--------+---------------+---------+-----------+----------+--------------------+ POP     Full           Yes      Yes                                       +--------+---------------+---------+-----------+----------+--------------------+ PTV                                                  no blood flow                                                             visualized           +--------+---------------+---------+-----------+----------+--------------------+ PERO                                                 no blood flow                                                             visualized           +--------+---------------+---------+-----------+----------+--------------------+     Summary: RIGHT: - Findings consistent with acute deep vein thrombosis involving the right peroneal veins, and right posterior tibial veins. - No cystic structure found in the popliteal fossa.  LEFT: - Findings consistent with acute deep vein thrombosis involving the left peroneal veins, and left posterior tibial veins. - No cystic structure found in the popliteal fossa.  *See table(s) above for measurements and observations. Electronically signed by Curt Jews MD on 02/01/2020 at 8:19:23 AM.    Final    Recent Labs    01/29/20 1644  HGB 7.3*  HCT 24.1*   No results for input(s): NA, K, CL, CO2, GLUCOSE, BUN, CREATININE, CALCIUM in the last 72 hours.  Intake/Output Summary (Last 24 hours) at 02/01/2020 0943 Last data filed at 01/31/2020 1843 Gross per 24 hour  Intake 460 ml  Output --  Net 460 ml  Physical Exam: Vital Signs Blood pressure 133/62, pulse 68, temperature 97.8 F (36.6 C), temperature source Oral, resp. rate 17, SpO2 96 %.  Constitutional: No distress  . Vital signs reviewed. HEENT: EOMI, oral membranes moist Neck: supple Cardiovascular: RRR without murmur. No JVD    Respiratory/Chest: CTA Bilaterally without wheezes or rales. Normal effort    GI/Abdomen: BS +, non-tender, non-distended Ext: no clubbing, cyanosis, or edema Psych: pleasant and cooperative Musculoskeletal:  Cervical back: Normal range of motion and neck supple.  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 Functional deficits secondary to osteomyelitis of vertebral of thoracolumbar region. Status post L1 corpectomy with pedicle screw fixation T11, 12, L2 and L3 01/22/2020 which require 3+ hours per day of interdisciplinary therapy in a comprehensive inpatient rehab setting.  Physiatrist is providing close team supervision and 24 hour management of active medical problems listed below.  Physiatrist and rehab team continue to assess barriers to discharge/monitor patient progress toward functional and medical goals  Care Tool:  Bathing    Body parts bathed by patient: Right arm, Left arm, Chest, Abdomen, Front perineal area, Right upper leg, Left upper leg, Face   Body parts bathed by helper: Buttocks, Left lower leg, Right lower leg     Bathing assist Assist Level: Minimal Assistance - Patient > 75%     Upper Body Dressing/Undressing Upper body dressing   What is the patient wearing?: Pull over shirt, Dress Orthosis activity level: Performed by helper  Upper body assist Assist Level: Minimal Assistance - Patient > 75%     Lower Body Dressing/Undressing Lower body dressing      What is the patient wearing?: Underwear/pull up     Lower body assist Assist for lower body dressing: Minimal Assistance - Patient > 75%     Toileting Toileting    Toileting assist Assist for toileting: Moderate Assistance - Patient 50 - 74%     Transfers Chair/bed transfer  Transfers assist     Chair/bed transfer assist level: Minimal Assistance - Patient > 75%     Locomotion Ambulation   Ambulation assist      Assist level: Contact Guard/Touching assist Assistive device: Walker-rolling Max distance: 130   Walk 10 feet activity   Assist     Assist level: Contact Guard/Touching assist Assistive device: Walker-rolling   Walk 50 feet activity   Assist Walk 50 feet with 2 turns activity did not occur: Safety/medical concerns  Assist level: Contact Guard/Touching assist Assistive device: Walker-rolling    Walk 150 feet activity   Assist Walk 150 feet activity did not occur: Safety/medical concerns         Walk 10 feet on uneven surface  activity   Assist     Assist level: Moderate Assistance - Patient - 50 - 74% Assistive device: Aeronautical engineer Will patient use wheelchair at discharge?: No             Wheelchair 50 feet with 2 turns activity    Assist  Wheelchair 150 feet activity     Assist          Blood pressure 133/62, pulse 68, temperature 97.8 F (36.6 C), temperature source Oral, resp. rate 17, SpO2 96 %.  Medical Problem List and Plan:  1. Decreased functional mobility secondary to osteomyelitis of vertebral of thoracolumbar region. Status post L1 corpectomy with pedicle screw fixation T11, 12, L2 and L3 01/22/2020. TLSO back brace donned and supine.  -patient may Shower- cover incision- might need 2 TLSOs  -ELOS/Goals: ~ 8-12 days- goals supervision to min assist  2. Antithrombotics:  -DVT/anticoagulation:  bilateral post-tib and peroneal DVT's.    -xarelto initiated yesterday   4/3 -reviewed plan/treatment with patient -antiplatelet therapy: N/A  3. Pain Management: Lyrica 150 mg twice daily, Lidoderm patch, Valium as needed muscle spasms as well as oxycodone as needed  4. Mood: Cymbalta 60 mg daily  -antipsychotic agents: N/A  5. Neuropsych: This patient is capable of making decisions on her own behalf.  6. Skin/Wound Care: Routine skin checks  7. Fluids/Electrolytes/Nutrition: Routine in and outs with follow-up chemistries  8. ID. 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 Follow-up per infectious disease  9. Diabetes mellitus. Lantus insulin as directed.    CBG (last 3)  Recent Labs    01/31/20 1752 01/31/20 2148 02/01/20 0631  GLUCAP 157* 223* 118*    4/2- BGs 115-258- on home lantus 30 BID- will monitor for trend and change as required  4/3 continued elevation   -increase lantus to 32u bid  4/4 follow for pattern today, some improvemnt 10. CKD stage III. Follow-up chemistries  4/2- Cr stable at 1.24- recheck Monday 11. 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 Wound care left foot as directed. Follow-up per Dr. Sharol Given  12. Acute on chronic anemia. Follow-up CBC   4/1- Hb 7.3- might need transfusion- will monitor   4/4 Recheck CBC 4/5 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 Patient did have assistance at home. Has "seeing eye dog" and husband  67. Constipation. Continue Senokot. MiraLAX as needed. 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

## 2020-02-01 NOTE — Progress Notes (Signed)
Pharmacy Antibiotic Note  [name] [name] is a 64 y.o. 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 S/p lumbar debridement and hardware stabilization on 3/25.  Pharmacy is consulted for cefepime dosing.  Afebrile, WBC wnl, and renal function is stable, est ClCr ~44 ml/min.    Plan Continue cefepime 2 gm IV q12h, end date 03/05/20 Watch renal fxn and adjust dose as indicated Consider consulting [facility] to enter home infusion antibiotic orders    Temp (24hrs), Avg:98.2 F (36.8 C), Min:97.8 F (36.6 C), Max:98.4 F (36.9 C)  Recent Labs  Lab 01/26/20 0551 01/27/20 0434 01/28/20 0356 01/29/20 0422  WBC 6.3 5.8 5.3 6.5  CREATININE 1.33* 1.29* 1.28* 1.24*    Estimated Creatinine Clearance (by C-G formula based on SCr of 1.24 mg/dL (H)) Female: 44.4 mL/min (A) Female: 54.6 mL/min (A)    Antimicrobials this admission: Levofloxacin 3/25 >> 3/26 Vancomycin 3/25 >> 3/27 Rifampin 3/26 >> 3/27 Cefepime 3/27 >> (5/7)  Microbiology this admission: 3/23 BCx >> neg 3/23 COVID negative 3/24 MRSA PCR negative 3/25 Bone bx > rare Pseudomonas, S to cefepime  Thank you for involving pharmacy in this patient's care.  Lorel Monaco, PharmD PGY1 Ambulatory Care Resident [facility] # [phone]

## 2020-02-02 ENCOUNTER — Inpatient Hospital Stay (HOSPITAL_COMMUNITY): Payer: PRIVATE HEALTH INSURANCE

## 2020-02-02 ENCOUNTER — Inpatient Hospital Stay (HOSPITAL_COMMUNITY): Payer: PRIVATE HEALTH INSURANCE | Admitting: Occupational Therapy

## 2020-02-02 DIAGNOSIS — I2510 Atherosclerotic heart disease of native coronary artery without angina pectoris: Secondary | ICD-10-CM

## 2020-02-02 DIAGNOSIS — E669 Obesity, unspecified: Secondary | ICD-10-CM

## 2020-02-02 DIAGNOSIS — K7030 Alcoholic cirrhosis of liver without ascites: Secondary | ICD-10-CM

## 2020-02-02 DIAGNOSIS — M8628 Subacute osteomyelitis, other site: Secondary | ICD-10-CM

## 2020-02-02 LAB — CBC WITH DIFFERENTIAL/PLATELET
Abs Immature Granulocytes: 0.07 10*3/uL (ref 0.00–0.07)
Basophils Absolute: 0.1 10*3/uL (ref 0.0–0.1)
Basophils Relative: 1 %
Eosinophils Absolute: 0.2 10*3/uL (ref 0.0–0.5)
Eosinophils Relative: 3 %
HCT: 22.9 % — ABNORMAL LOW (ref 36.0–46.0)
Hemoglobin: 6.7 g/dL — CL (ref 12.0–15.0)
Immature Granulocytes: 1 %
Lymphocytes Relative: 26 %
Lymphs Abs: 1.5 10*3/uL (ref 0.7–4.0)
MCH: 25.7 pg — ABNORMAL LOW (ref 26.0–34.0)
MCHC: 29.3 g/dL — ABNORMAL LOW (ref 30.0–36.0)
MCV: 87.7 fL (ref 80.0–100.0)
Monocytes Absolute: 0.5 10*3/uL (ref 0.1–1.0)
Monocytes Relative: 10 %
Neutro Abs: 3.3 10*3/uL (ref 1.7–7.7)
Neutrophils Relative %: 59 %
Platelets: 167 10*3/uL (ref 150–400)
RBC: 2.61 MIL/uL — ABNORMAL LOW (ref 3.87–5.11)
RDW: 15.3 % (ref 11.5–15.5)
WBC: 5.6 10*3/uL (ref 4.0–10.5)
nRBC: 0 % (ref 0.0–0.2)

## 2020-02-02 LAB — COMPREHENSIVE METABOLIC PANEL
ALT: 12 U/L (ref 0–44)
AST: 13 U/L — ABNORMAL LOW (ref 15–41)
Albumin: 1.8 g/dL — ABNORMAL LOW (ref 3.5–5.0)
Alkaline Phosphatase: 62 U/L (ref 38–126)
Anion gap: 7 (ref 5–15)
BUN: 18 mg/dL (ref 8–23)
CO2: 28 mmol/L (ref 22–32)
Calcium: 8.4 mg/dL — ABNORMAL LOW (ref 8.9–10.3)
Chloride: 106 mmol/L (ref 98–111)
Creatinine, Ser: 1.61 mg/dL — ABNORMAL HIGH (ref 0.44–1.00)
GFR calc Af Amer: 39 mL/min — ABNORMAL LOW (ref >60)
GFR calc non Af Amer: 33 mL/min — ABNORMAL LOW (ref >60)
Glucose, Bld: 172 mg/dL — ABNORMAL HIGH (ref 70–99)
Potassium: 4.1 mmol/L (ref 3.5–5.1)
Sodium: 141 mmol/L (ref 135–145)
Total Bilirubin: 0.2 mg/dL — ABNORMAL LOW (ref 0.3–1.2)
Total Protein: 6.3 g/dL — ABNORMAL LOW (ref 6.5–8.1)

## 2020-02-02 LAB — GLUCOSE, CAPILLARY
Glucose-Capillary: 153 mg/dL — ABNORMAL HIGH (ref 70–99)
Glucose-Capillary: 151 mg/dL — ABNORMAL HIGH (ref 70–99)
Glucose-Capillary: 127 mg/dL — ABNORMAL HIGH (ref 70–99)
Glucose-Capillary: 278 mg/dL — ABNORMAL HIGH (ref 70–99)

## 2020-02-02 LAB — PREPARE RBC (CROSSMATCH)

## 2020-02-02 MED ORDER — SENNA 8.6 MG PO TABS
1.0000 | ORAL_TABLET | Freq: Every day | ORAL | Status: DC
  Administered 2020-02-03 – 2020-02-18 (×10): 8.6 mg via ORAL
  Filled 2020-02-02 (×16): qty 1

## 2020-02-02 MED ORDER — BACLOFEN 5 MG HALF TABLET
5.0000 mg | ORAL_TABLET | Freq: Three times a day (TID) | ORAL | Status: DC
  Administered 2020-02-02 – 2020-02-04 (×8): 5 mg via ORAL
  Filled 2020-02-02 (×8): qty 1

## 2020-02-02 MED ORDER — SODIUM CHLORIDE 0.9% IV SOLUTION: Freq: Once | INTRAVENOUS | Status: AC

## 2020-02-02 MED ORDER — ACETAMINOPHEN 325 MG PO TABS
650.0000 mg | ORAL_TABLET | Freq: Once | ORAL | Status: AC
  Administered 2020-02-02: 650 mg via ORAL
  Filled 2020-02-02: qty 2

## 2020-02-02 NOTE — Progress Notes (Signed)
Dan PA notified of Critical Lab 6.7 HGB.
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
## 2020-02-02 NOTE — Progress Notes (Signed)
Occupational Therapy Session Note  Patient Details  Name: [name] [name] MRN: [id number] Date of Birth: June 07, 1956  Today's Date: 02/02/2020 OT Individual Time: 1130-1210 OT Individual Time Calculation (min): 40 min  Attempted tp see patient at 1000 as scheduled but nursing starting blood transfusion at this time.  Re-attempted to make up missed time at 1130 and was able to see patient for 40 minutes.  Missed 20 minutes due to re-schedule.    Short Term Goals: Week 1:  OT Short Term Goal 1 (Week 1): STG=LTG d/t ELOS  Skilled Therapeutic Interventions/Progress Updates:    patient in bed, continues to received blood transfusion but eager to complete L UB and B LB conditioning exercises which she completes with min A.  L UE PROM/shoulder stretches.   Reviewed home set up and DME, visual defict and history of limited use of bilateral UEs.  Completed repositioning bed level with mod A.  She remained in bed at close of session with bed alarm set and call bell in reach.    Therapy Documentation Precautions:  Precautions Precautions: Back, Fall, Other (comment)(has had issues w/orthostatic hypotension) Precaution Comments: Pt instructed in back precautions, and to self-monitor for activity tolerance, Required Braces or Orthoses: Spinal Brace Spinal Brace: Thoracolumbosacral orthotic, Applied in supine position Restrictions Weight Bearing Restrictions: No   Therapy/Group: Individual Therapy  Carlos Levering 02/02/2020, 7:50 AM

## 2020-02-02 NOTE — Progress Notes (Signed)
Transitions of Care Pharmacist Note  [name] [name] is a 64 y.o. female that has been diagnosed with DVT and will be prescribed Xarelto (rivaroxaban) at discharge.   Patient Education: I provided the following education on 02/02/20 to the patient: How to take the medication Described what the medication is Signs of bleeding Signs/symptoms of VTE and stroke  Answered their questions  Discharge Medications Plan: The patient wants to have their discharge medications filled by the Transitions of Care pharmacy rather than their usual pharmacy.  The discharge orders pharmacy has been changed to the Transitions of Care pharmacy, the patient will receive a phone call regarding co-pay, and their medications will be delivered by the Trans[facility] of [facility] pharmacy.    Thank you,   Berenice Bouton, PharmD PGY1 Pharmacy Resident  Please check AMION for all [facility] phone numbers After 10:00 PM, call [place] [phone]  February 02, 2020
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
## 2020-02-02 NOTE — Progress Notes (Addendum)
Physical Therapy Session Note  Patient Details  Name: [name] [name] MRN: [id number] Date of Birth: 1956/05/07  Today's Date: 02/02/2020 PT Individual Time: 0800-0902 and 2992-4268 PT Individual Time Calculation (min): 62 min and 66 min   Short Term Goals: Week 1:  PT Short Term Goal 1 (Week 1): STG=LTGs  Skilled Therapeutic Interventions/Progress Updates:     Session 1: Patient in bed with RN in the room upon PT arrival. Patient alert and agreeable to PT session. Patient reported 7/10 back and LE pain during session, RN aware. PT provided repositioning, rest breaks, and distraction as pain interventions throughout session. Patient with low hemoglobin this morning per chart, RN reported that patient would be receiving 1 unit of blood via transfusion today. PA cleared patient for all mobility while monitoring symptoms per discussion. Donned R TED hose prior to mobility with total A for energy conservation/time management.   Therapeutic Activity: Bed Mobility: Patient performed rolling R/L and supine to/from sit x2 with min A-CGA for trunk support in a flat bed without use of bed rails x1. Provided verbal cues for performing log rolling to maintain spinal precautions. Patient doffed her gown and donned a shirt with supervision in sitting then threaded LEs through pants with min A. Donned TLSO with total A in supine following UB dressing.  Transfers: Patient performed sit to/from stand x3 with CGA using a RW. Provided verbal cues for hand placement to prevent twisting her back with transfers.  Gait Training:  Patient ambulated 12 feet x2 using RW with CGA-min A due to posterior LOB x3. Ambulated with decreased B step length and height, foot flat at initial contact, and minor knee instability into extensor thrust bilaterally. Provided verbal cues for forward weight shift, increased eccentric quad contraction in stance, and navigation around the room due to visual deficits.  Discussed home set  up, prior household mobility as patient used a scooter and walker in the home, visual impairments and home navigation when home alone x4-5 hours in the afternoon, and fall risk throughout session.  Patient in bed with TLSO brace doffed with total A at end of session with breaks locked, bed alarm set, and all needs within reach.   Session 2: Patient in bed in semi-fowlers position upon PT arrival. Patient alert and agreeable to PT session. Patient reported dizziness sitting in the bed, placed the patient in supine and patient reported symptoms resolved. Patient reported 8/10 low back and LE pain during session, RN made aware. PT provided repositioning, rest breaks, and distraction as pain interventions throughout session.   Performed basic visual assessment, no nystagmus with fixed gaze or visual tracking, noted that patient was unable to track R visual field, reports that this is baseline for her. Denied dizziness with eye movement and head turns sitting in the bed.   Orthostatic Vitals: Supine: BP 130/61 HR 75 (asymptomatic) Sitting: 129/49 HR 79 (asymptomatic) Sitting after 10 min: 127/47 HR 77 (asymptomatic) Standing: BP 87/45 HR 80 (asymptomatic) Standing x3 min: BP 72/39 HR 83 (symptomatic) Returned to supine with LE's elevated: BP 141/70 HR 79  Therapeutic Activity: Bed Mobility: Patient performed rolling R/L with min A and use of bed rails to don/doff TLSO in supine with total A. She performed supine to sit with mod A with use of bed rail and sit to supine with CGA for safety. Provided verbal cues for performing log rolling technique to maintain spinal precautions and cues for donning TLSO. She also performed a lateral scoot with CGA for  safety seated EOB before lying down. Patient sat EOB x10 min for increased sitting tolerance. Discussed home transfers while sitting EOB. Patient was performing stand pivots to her scooter without an AD and using her scooter for locomotion in the home when  alone and used a knee walker for her L LE for home locomotion with her husband.   Transfers: Patient performed sit to/from stand x1 and stood x3 min  with CGA without an AD, used back of legs to stabilize against the bed. Provided verbal cues for bending at the hips with a flat back to maintain spinal precautions. She maintained standing balance x3 min with CGA without UE support, noted increased LE "jumping" with fatigue.   Patient in bed at end of session with breaks locked, bed alarm set, and all needs within reach. Educated patient on [place] and signs and symptoms of OH. Discussed options to improve OH with mobility during therapies tomorrow. Patient requires increased time to complete all mobility due to pain and frequent rest breaks. Patient requested to rest following second bout of dizziness in standing. Patient missed 9 min of skilled PT due to dizziness/OH, RN made aware. Will attempt to make-up missed time as able.      Therapy Documentation Precautions:  Precautions Precautions: Back, Fall, Other (comment)(has had issues w/orthostatic hypotension) Precaution Comments: Pt instructed in back precautions, and to self-monitor for activity tolerance, Required Braces or Orthoses: Spinal Brace Spinal Brace: Thoracolumbosacral orthotic, Applied in supine position Restrictions Weight Bearing Restrictions: No General: PT Amount of Missed Time (min): 9 Minutes PT Missed Treatment Reason: Other (Comment)(dizziness, OH)    Therapy/Group: Individual Therapy  Breeana Sawtelle L Dmiyah Liscano PT, DPT  02/02/2020, 3:28 PM

## 2020-02-03 ENCOUNTER — Inpatient Hospital Stay (HOSPITAL_COMMUNITY): Payer: PRIVATE HEALTH INSURANCE | Admitting: Occupational Therapy

## 2020-02-03 ENCOUNTER — Inpatient Hospital Stay (HOSPITAL_COMMUNITY): Payer: PRIVATE HEALTH INSURANCE

## 2020-02-03 LAB — CBC WITH DIFFERENTIAL/PLATELET
Abs Immature Granulocytes: 0.08 10*3/uL — ABNORMAL HIGH (ref 0.00–0.07)
Basophils Absolute: 0.1 10*3/uL (ref 0.0–0.1)
Basophils Relative: 1 %
Eosinophils Absolute: 0.2 10*3/uL (ref 0.0–0.5)
Eosinophils Relative: 3 %
HCT: 26.9 % — ABNORMAL LOW (ref 36.0–46.0)
Hemoglobin: 8.1 g/dL — ABNORMAL LOW (ref 12.0–15.0)
Immature Granulocytes: 1 %
Lymphocytes Relative: 19 %
Lymphs Abs: 1.3 10*3/uL (ref 0.7–4.0)
MCH: 26 pg (ref 26.0–34.0)
MCHC: 30.1 g/dL (ref 30.0–36.0)
MCV: 86.5 fL (ref 80.0–100.0)
Monocytes Absolute: 0.7 10*3/uL (ref 0.1–1.0)
Monocytes Relative: 10 %
Neutro Abs: 4.3 10*3/uL (ref 1.7–7.7)
Neutrophils Relative %: 66 %
Platelets: 179 10*3/uL (ref 150–400)
RBC: 3.11 MIL/uL — ABNORMAL LOW (ref 3.87–5.11)
RDW: 15.2 % (ref 11.5–15.5)
WBC: 6.6 10*3/uL (ref 4.0–10.5)
nRBC: 0 % (ref 0.0–0.2)

## 2020-02-03 LAB — BPAM RBC
Blood Product Expiration Date: 202104082359
ISSUE DATE / TIME: 202104050940
Unit Type and Rh: 9500

## 2020-02-03 LAB — BASIC METABOLIC PANEL
Anion gap: 7 (ref 5–15)
BUN: 18 mg/dL (ref 8–23)
CO2: 27 mmol/L (ref 22–32)
Calcium: 8.3 mg/dL — ABNORMAL LOW (ref 8.9–10.3)
Chloride: 105 mmol/L (ref 98–111)
Creatinine, Ser: 1.48 mg/dL — ABNORMAL HIGH (ref 0.44–1.00)
GFR calc Af Amer: 43 mL/min — ABNORMAL LOW (ref >60)
GFR calc non Af Amer: 37 mL/min — ABNORMAL LOW (ref >60)
Glucose, Bld: 192 mg/dL — ABNORMAL HIGH (ref 70–99)
Potassium: 4.1 mmol/L (ref 3.5–5.1)
Sodium: 139 mmol/L (ref 135–145)

## 2020-02-03 LAB — TYPE AND SCREEN
ABO/RH(D): A NEG
Antibody Screen: NEGATIVE
Unit division: 0

## 2020-02-03 LAB — GLUCOSE, CAPILLARY
Glucose-Capillary: 153 mg/dL — ABNORMAL HIGH (ref 70–99)
Glucose-Capillary: 121 mg/dL — ABNORMAL HIGH (ref 70–99)
Glucose-Capillary: 119 mg/dL — ABNORMAL HIGH (ref 70–99)
Glucose-Capillary: 113 mg/dL — ABNORMAL HIGH (ref 70–99)

## 2020-02-03 MED ORDER — INSULIN GLARGINE 100 UNIT/ML ~~LOC~~ SOLN
34.0000 [IU] | Freq: Two times a day (BID) | SUBCUTANEOUS | Status: DC
  Administered 2020-02-03 – 2020-02-07 (×7): 34 [IU] via SUBCUTANEOUS
  Filled 2020-02-03 (×10): qty 0.34

## 2020-02-03 NOTE — Progress Notes (Signed)
Occupational Therapy Session Note  Patient Details  Name: Jazzlynn Rawe MRN: [id number] Date of Birth: [birth year]  Today's Date: 02/03/2020 OT Individual Time: 1500-1530 OT Individual Time Calculation (min): 30 min    Short Term Goals: Week 1:  OT Short Term Goal 1 (Week 1): STG=LTG d/t ELOS  Skilled Therapeutic Interventions/Progress Updates:    patient in bed, asleep, slow to arouse and having pain in lower back (nursing aware) but agreeable to place brace and complete bed mobility/sitting activities.  She is dependent to place TLSO in supine.  CS with rails to roll in bed.  Side lying to sitting edge of bed with modA.  CS for unsupported sitting.  SPT to w/c with min A.  She tolerated supported sitting for 15 minutes with UB and LB light exercise.  SPT back to bed with CGA.  Sitting to side lying with min A,  Max A to remove brace.  She remained in bed, bed alarm set and call bell in hand, lights on - she is able to demonstrate ability to use nurse call bell.  She reports improved pain level at close of session.    Therapy Documentation Precautions:  Precautions Precautions: Back, Fall, Other (comment)(has had issues w/orthostatic hypotension) Precaution Comments: Pt instructed in back precautions, and to self-monitor for activity tolerance, Required Braces or Orthoses: Spinal Brace Spinal Brace: Thoracolumbosacral orthotic, Applied in supine position Restrictions Weight Bearing Restrictions: No   Therapy/Group: Individual Therapy  Carlos Levering 02/03/2020, 12:08 PM

## 2020-02-03 NOTE — Progress Notes (Signed)
[place] PHYSICAL MEDICINE & REHABILITATION PROGRESS NOTE   Subjective/Complaints:  Pt had fall overnight- ~4am- having back and side pain from fall and headache 8/10 -thinks she hit her head and back- but c/o paraspinals pain and headache more than anything.  Said she "woke up" on the floor"- no alarm went off.    ROS:   Pt denies SOB, abd pain, CP, N/V/C/D, and vision changes   Objective:   No results found. Recent Labs    02/02/20 0324 02/03/20 0409  WBC 5.6 6.6  HGB 6.7* 8.1*  HCT 22.9* 26.9*  PLT 167 179   Recent Labs    02/02/20 0324 02/03/20 0409  NA 141 139  K 4.1 4.1  CL 106 105  CO2 28 27  GLUCOSE 172* 192*  BUN 18 18  CREATININE 1.61* 1.48*  CALCIUM 8.4* 8.3*    Intake/Output Summary (Last 24 hours) at 02/03/2020 0912 Last data filed at 02/02/2020 1818 Gross per 24 hour  Intake 1020 ml  Output -  Net 1020 ml     Physical Exam: Vital Signs Blood pressure (!) 128/50, pulse 79, temperature 98.2 F (36.8 C), resp. rate 16, SpO2 98 %.  Constitutional:laying on L side, appropriate, but crying intermittently due to pain, NAD HEENT: slightly less pale conjunctivae Neck: supple Cardiovascular: RRR; no JVD   Respiratory/Chest: CTA B/L good air movement GI/Abdomen: soft, NT, ND, (+)BS Ext: no clubbing, cyanosis, or edema Psych: flat, tearful affect Musculoskeletal:  TTP over posterior head- no bumps/lesions from fall TTP over thoracic and lumbar paraspinals however not on spine itself- no TTP midline except normal over incision area.  Cervical back: Normal range of motion and neck supple.  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 Functional deficits secondary to osteomyelitis of vertebral of thoracolumbar region. Status post L1 corpectomy with pedicle screw fixation T11, 12, L2 and L3 01/22/2020 which require 3+ hours per day of interdisciplinary therapy in a comprehensive inpatient rehab setting.  Physiatrist is providing close team supervision and 24 hour management of active medical problems listed below.  Physiatrist and rehab team continue to assess barriers to discharge/monitor patient progress toward functional and medical goals  Care Tool:  Bathing    Body parts bathed by patient: Right arm, Left arm, Chest, Abdomen, Front perineal area, Right upper leg, Left upper leg, Face   Body parts bathed by helper: Buttocks, Left lower leg, Right lower leg     Bathing assist Assist Level: Minimal Assistance - Patient > 75%     Upper Body Dressing/Undressing Upper body dressing   What is the patient wearing?: Pull over shirt Orthosis activity level: Performed by helper  Upper body assist Assist Level: Minimal Assistance - Patient > 75%    Lower Body Dressing/Undressing Lower body dressing      What is the patient wearing?: Incontinence brief, Pants     Lower body assist Assist for lower body dressing: Minimal Assistance - Patient > 75%  Toileting Toileting    Toileting assist Assist for toileting: Moderate Assistance - Patient 50 - 74%     Transfers Chair/bed transfer  Transfers assist     Chair/bed transfer assist level: Minimal Assistance - Patient > 75%     Locomotion Ambulation   Ambulation assist      Assist level: Minimal Assistance - Patient > 75% Assistive device: Walker-rolling Max distance: 12'   Walk 10 feet activity   Assist     Assist level: Minimal  Assistance - Patient > 75% Assistive device: Walker-rolling   Walk 50 feet activity   Assist Walk 50 feet with 2 turns activity did not occur: Safety/medical concerns  Assist level: Contact Guard/Touching assist Assistive device: Walker-rolling    Walk 150 feet activity   Assist Walk 150 feet activity did not occur: Safety/medical concerns         Walk 10 feet on uneven surface  activity   Assist     Assist level: Moderate Assistance - Patient - 50 - 74% Assistive device: Aeronautical engineer Will patient use wheelchair at discharge?: No             Wheelchair 50 feet with 2 turns activity    Assist            Wheelchair 150 feet activity     Assist          Blood pressure (!) 128/50, pulse 79, temperature 98.2 F (36.8 C), resp. rate 16, SpO2 98 %.  Medical Problem List and Plan:  1. Decreased functional mobility secondary to osteomyelitis of vertebral of thoracolumbar region. Status post L1 corpectomy with pedicle screw fixation T11, 12, L2 and L3 01/22/2020. TLSO back brace donned and supine.  -patient may Shower- cover incision- might need 2 TLSOs  4/6- had fall last night- has 8/10 headache and paraspinal soreness/pain- will check CT of head -ELOS/Goals: ~ 8-12 days- goals supervision to min assist  2. Antithrombotics:  -DVT/anticoagulation: bilateral post-tib and peroneal DVT's.    -xarelto initiated yesterday   4/3 -reviewed plan/treatment with patient -antiplatelet therapy: N/A  3. Pain Management: Lyrica 150 mg twice daily, Lidoderm patch, Valium as needed muscle spasms as well as oxycodone as needed   4/5- due to descriptions of spasticity- will try Adding Baclofen 5 mg TID 4. Mood: Cymbalta 60 mg daily  -antipsychotic agents: N/A  5. Neuropsych: This patient is capable of making decisions on her own behalf.  6. Skin/Wound Care: Routine skin checks  7. Fluids/Electrolytes/Nutrition: Routine in and outs  with follow-up chemistries  8. ID. 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 Follow-up per infectious disease  9. Diabetes mellitus. Lantus insulin as directed.    CBG (last 3)  Recent Labs    02/02/20 1656 02/02/20 2119 02/03/20 0609  GLUCAP 127* 278* 153*      4/6- change Lantus to 34 units due to elevated BGs 10. CKD stage III. Follow-up chemistries  4/2- Cr stable at 1.24- recheck Monday  4/5- Cr up to 1.61- could be due to being dry- will recheck in AM after 1 unit pRBCs  4/6- Cr down to 1.48- has improved- con't to push fluids 11. 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 Wound care left foot as directed. Follow-up per Dr. Sharol Given  12. Acute on chronic anemia. Follow-up CBC   4/1- Hb 7.3- might need transfusion- will monitor  4/4 Recheck CBC 4/5  4/5- CBC Hb 6.7- s/p 1 unit pRBCs- will recheck in AM  4/6- Hb up to 8.1- con't regimen-  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 Patient did have assistance at home. Has "seeing eye dog" and husband  63. Constipation. Continue Senokot. MiraLAX as needed. 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 Fall overnight  4/6- getting head CT and monitor- strength unchanged- appears in pain and tremors slightly worse- no other changes    LOS: 5 days A FACE TO FACE EVALUATION WAS PERFORMED  Dewaun Kinzler 02/03/2020, 9:12 AM

## 2020-02-03 NOTE — Progress Notes (Signed)
Physical Therapy Session Note  Patient Details  Name: [name] [name] MRN: [id number] Date of Birth: [birth year]  Today's Date: 02/03/2020 PT Individual Time: 1325-1350 PT Individual Time Calculation (min): 25 min   Short Term Goals: Week 1:  PT Short Term Goal 1 (Week 1): STG=LTGs  Skilled Therapeutic Interventions/Progress Updates:    Patient received in bed, very polite but very anxious regarding her fall earlier; begged PT not to take 4 bedrails down as she is very scared she will roll off the side of the bed again. Having quite a bit of pain in her back but had slid fairly far down the bed so assisted her in repositioning in bed with ModA to scoot up with HOB tilted down and patient pushing with legs. Otherwise focused on functional exercises in the bed including ankle pumps, supine hip abduction, SAQs, and BUE flexion. Left in bed positioned to comfort with all needs met, bed alarm active and all 4 rails up per patient request.   Therapy Documentation Precautions:  Precautions Precautions: Back, Fall, Other (comment)(has had issues w/orthostatic hypotension) Precaution Comments: Pt instructed in back precautions, and to self-monitor for activity tolerance, Required Braces or Orthoses: Spinal Brace Spinal Brace: Thoracolumbosacral orthotic, Applied in supine position Restrictions Weight Bearing Restrictions: No Pain: Pain Assessment Pain Scale: Faces Pain Score: 0-No pain(sleeping) Faces Pain Scale: Hurts even more Pain Type: Acute pain Pain Location: Back Pain Orientation: Lower Pain Descriptors / Indicators: Aching;Sharp;Sore Pain Onset: On-going Patients Stated Pain Goal: 0 Pain Intervention(s): Repositioned;Distraction    Therapy/Group: Individual Therapy   Windell Norfolk, DPT, PN1   Supplemental Physical Therapist [place]    Pager [phone] Acute Rehab Office [phone]    02/03/2020, 3:43 PM

## 2020-02-03 NOTE — Progress Notes (Signed)
Upon assessment pt appeared lethargic and disoriented. Pt was unable to tell this nurse the month/location of where she was/ city/ or reason for being at the hospital. Pt was asked various questions regarding where she was and why, but only continued to answer with " here, here, here". Pts vital signs all WNL Pts baseline is A & O x4, at this time pt was A & O x1. Charge RN, Collie Siad notified. Pt displayed the same behaviors. Pt having Repetitive speech, answer " here, [place], [place]" to all questions. This nurse educated the pt on the importance of following the commands being asked so appropriate help can be arranged. Pt was unable to follow commands and repeating the same words over and over. Rapid Response notified due to the AMS of the pt. Once  [name] RN arrived the pt was able to answer all questions appropriately, with no confusion, and the pt stated " I am tired and want to go to bed". Pt denied pain, denied confusion, and able to recall all events. When this nurse and [name] RN left the room, the pt made a phone call  Telling the family member " I do not know why they think I am crazy, I have been answering all of the questions correctly, they are treating me like I have lost my mind".   This nurse returned to the room after the pt called out for pain medication. Upon entering the room the pt began to repeat her words and talk to her self. No questions was to the pt, but the pt continued to say  "[place], [place], [place], [place]". The pt was educated on the medications being administered. Tylenol administered for pain due to the [place] and confusion during the assignment. Pt was upset about getting only the PRN tylenol. Pt resting In bed, call light in reach, bed alarm on, personal cell phone on bedside table.

## 2020-02-03 NOTE — Significant Event (Signed)
Was the fall witnessed: no  Patient condition before and after the fall: Prior to fall the pt was resting in bed. IV team drew blood at 0409, pt was found at 0426. Pt was found lying in the middle of the floor,  bathroom door closed, pts room door closed, lights on, call light on the pts bed.   Patient's reaction to the fall: Pt was tearful and upset that she was going to get hurt. Pt was anxious and shaking. Pt requested to use the [facility] before returning to the bed. Pt c/o 8/10 post surgery lower back pain; no new pain noted.     Name of the doctor that was notified including date and time: Silvestre Mesi, [place]; notified at [phone].   Any interventions and vital signs:   Pt A&O x4 BP- 157/58 Pulse 93 Temp 98.2 RR 20 99% on RA.   Pt assisted by Sophronia Simas, NT; [name], RN; Zaryiah Barz, LPN, to get in the [facility], and transferred to the bed. Call light in reach, bed alarm on, pt resting at this time, no signs of distress noted.    Husband [name] notified [phone]

## 2020-02-03 NOTE — Patient Care Conference (Signed)
Inpatient RehabilitationTeam Conference and Plan of Care Update Date: 02/03/2020   Time: 11:40 AM    Patient Name: [name] [name]      Medical Record Number: [id number]  Date of Birth: [birth year] Sex: Adult         Room/Bed: 4W17C/4W17C-01 Payor Info: Payor: [employer]IC [employer]CIAL / Plan: GENERIC COMMERCIAL / Product Type: *No Product type* /    Admit Date/Time:  01/29/2020  4:50 PM  Primary Diagnosis:  Osteomyelitis ([place])  Patient Active Problem List   Diagnosis Date Noted  . Osteomyelitis ([place]) 01/29/2020  . Spinal stenosis of lumbar region 01/22/2020  . Osteomyelitis of vertebra of thoracolumbar region ([place]) 01/21/2020  . Paraspinal mass 01/20/2020  . Back pain 11/17/2019  . Compression fracture of L1 lumbar vertebra ([place]) 11/17/2019  . Fracture of multiple ribs 11/17/2019  . Intractable nausea and vomiting 11/16/2019  . ARF (acute renal failure) ([place]) 11/15/2019  . Nausea & vomiting 11/15/2019  . Pressure injury of skin 04/17/2019  . Septic arthritis of elbow, right ([place]) 04/17/2019  . 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 Sleep apnea 11/16/2017  . Diabetic foot infection ([place]) 04/06/2017  . Type 2 diabetes mellitus with hyperglycemia, with long-term current use of insulin ([place]) 04/06/2017  . Ulcer of left heel, limited to breakdown of skin ([place]) 02/01/2017  . Neuropathy 05/05/2015  . Diabetic peripheral neuropathy ([place]) 01/12/2015  . CKD (chronic kidney disease), stage III ([place]) 05/27/2014  . History of MI (myocardial infarction) 10/06/2013  . Nausea and vomiting 10/06/2013  . Obesity (BMI 30-39.9) 09/20/2013  . 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 Normocytic anemia 02/03/2013  . CAD (coronary artery disease) 01/11/2012  . Hepatic cirrhosis ([place]) 07/06/2010  . 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  . Hyperlipidemia 06/03/2008  . Essential hypertension 12/23/2006    Expected Discharge Date: Expected Discharge Date: 02/13/20  Team Members  Present: Physician leading conference: Dr. Courtney Heys Care Coodinator Present: Karene Fry, RN, MSN;Auria Barbra Sarks, [place] Nurse Present: Other (comment)(Kristy Lovena Le, RN) PT Present: Apolinar Junes, PT OT Present: Elisabeth Most, OT SLP Present: Jettie Booze, CF-SLP PPS Coordinator present : Ileana Ladd, PT     Current Status/Progress Goal Weekly Team Focus  Bowel/Bladder   Pt is continent of b/b, LBM 02/01/20  Pt will remain contiennt of b/b  Q2h toileting   Swallow/Nutrition/ Hydration             ADL's   Set up/CS UB dressing, dependent TLSO, min A LB dressing, CGA functional transfers.  CS/ mod I  adl training, explore use of assistive devices, transfer/mobility training, family education   Mobility   Min-mod A bed mobility, CGA-supervision transfers with and without AD, gait up to 150 ft with RW, mod A 2 steps B rails  Mod I bed mobility, transfers, and household gait (determine if safe due to visual deficits), supervision community ambulation, CGA 1+1 stairs and 4 steps B rails  Balance, strengthening, NMR UE and LE, functional mobility, gait and stair training, reinforce spinal precautions, donning/doffing TLSO, patient/caregiver education   Communication             Safety/Cognition/ Behavioral Observations            Pain   Pt c/o of 8/10 back pain.  Pt pain will be less  than 3  Assess pain qshift   Skin   Surgical incision to lower back ,honecomb dressing with old/new drainage, MASD under breast  Skin will remain free from further infrection and breakdown  assess  skin Qshift/PRN    Rehab Goals Patient on target to meet rehab goals: Yes *See Care Plan and progress notes for long and short-term goals.     Barriers to Discharge  Current Status/Progress Possible Resolutions Date Resolved   Nursing                  PT  Medical stability;Home environment access/layout;Decreased caregiver support;Lack of/limited family support;Wound Care  Patient will be alone  4-5 hours in the afternoon when her husband is at work, has a HHA in the morning, has 2 STE home without rails, currently has low hemoglobin, OH, and wounds to L heel/foot              OT                  SLP                SW Decreased caregiver support;Lack of/limited family support              Discharge Planning/Teaching Needs:  D/c to home with support from husband (intermittent since he works); pt has paid caregiver 9am-12pm; husband comes home for lunch 12pm-1pm; and a friend will check in/sit with her 1pm-4pm until he arrives home from work.  Family education as recommended by therapy.   Team Discussion: MD CT scan done, back pain, blood transfusion yesterday, monitoring labs, push fluids, neuropsych to see, fell last night, awoke on floor, drsg needs to be changed daily.  RN confusion, keep bed alarm on.  OT tot A with brace, min A mobility, will need S.  PT min/mod bed, total brace, CGA/S transfers RW, amb 12' CGA RW, visual deficits, balance deficits, mod A 4 steps with rail, S goals.  Home alone 1p to 6p, has steps, will need 24/7 S at DC.   Revisions to Treatment Plan: N/A     Medical Summary Current Status: a little more confusion this AM- head CT (-); bed alarm placed on; Weekly Focus/Goal: OT- very little ability to work secondary to transfusion/fall; total assist for brace; min assist mobility- had aides prior- needs 24/7  Barriers to Discharge: Behavior;Decreased family/caregiver support;Weight;Weight bearing restrictions;Wound care;Home enviroment access/layout  Barriers to Discharge Comments: d/c 4/16 Possible Resolutions to Barriers: PT- min-mod assist bed; transfers-CGA-supervision- now min assist for transfers; walked max 150 ft- RW prior- 76f x2 so far CGA-min assist; using knee scooter due to NWB L heel prior.   Continued Need for Acute Rehabilitation Level of Care: The patient requires daily medical management by a physician with specialized training in physical  medicine and rehabilitation for the following reasons: Direction of a multidisciplinary physical rehabilitation program to maximize functional independence : Yes Medical management of patient stability for increased activity during participation in an intensive rehabilitation regime.: Yes Analysis of laboratory values and/or radiology reports with any subsequent need for medication adjustment and/or medical intervention. : Yes   I attest that I was present, lead the team conference, and concur with the assessment and plan of the team.   LRetta Diones4/03/2020, 7:57 PM   Team conference was held via web/ teleconference due to [place]- 19

## 2020-02-03 NOTE — Progress Notes (Signed)
Occupational Therapy Session Note  Patient Details  Name: [name] [name] MRN: [id number] Date of Birth: [birth year]  Today's Date: 02/03/2020 OT Individual Time:  -     Patient missed 60 minute session due to CT being completed at this time, s/p fall   Short Term Goals: Week 1:  OT Short Term Goal 1 (Week 1): STG=LTG d/t ELOS  Skilled Therapeutic Interventions/Progress Updates:    will attempt to make up missed time later today as medically appropriate  Therapy Documentation Precautions:  Precautions Precautions: Back, Fall, Other (comment)(has had issues w/orthostatic hypotension) Precaution Comments: Pt instructed in back precautions, and to self-monitor for activity tolerance, Required Braces or Orthoses: Spinal Brace Spinal Brace: Thoracolumbosacral orthotic, Applied in supine position Restrictions Weight Bearing Restrictions: No  Therapy/Group: Individual Therapy  Carlos Levering 02/03/2020, 7:46 AM

## 2020-02-03 NOTE — Progress Notes (Signed)
Physical Therapy Session Note  Patient Details  Name: [name] [name] MRN: [id number] Date of Birth: August 14, 1956  Today's Date: 02/03/2020 PT Individual Time: 0800-0835 PT Individual Time Calculation (min): 35 min   Short Term Goals: Week 1:  PT Short Term Goal 1 (Week 1): STG=LTGs  Skilled Therapeutic Interventions/Progress Updates:     Patient in seated in w/c without TLSO donned upon PT arrival. Patient alert and agreeable to PT session. Patient reported 10/10 back and R side pain, worse than yesterday, and new posterior L side head pain at beginning of session, RN provided pain medicine and RN and Dr. Dagoberto Ligas, MD made aware of new pain. PT provided repositioning, rest breaks, and distraction as pain interventions throughout session. Patient also reported worse UE and LE "shakes" this morning. Patient wa AO x4. No new bruising to head or trunk, TTP over R ribs and B paraspinals. MD ordering head CT per assessment.   Patient transferred back to bed, as described below, with TLSO donned seated in w/c with total A prior to mobility. Confirmed with MD that TLSO to be donned in supine prior to any sitting or standing mobility, placed sign above patient's bed for staff. Educated Engineer, civil (consulting) about this after.  Therapeutic Activity: Bed Mobility: Patient performed sit to supine with min A for pain managment. Provided verbal cues for performing side-lying then log rolling to her back. Doffed TLSO with patient in supine with total A. Performed rolling R/L during assessment with min A for pain management. Transfers: Patient performed a stand pivot transfer w/c>bed with min A. Provided verbal cues for maintaining spinal precautions.  Patient in resting in bed following relaxation techniques including guided imagery and diaphragmatic breathing for pain and anxiety managment at end of session with breaks locked, bed alarm set, 4 rails up per patient request, and all needs within reach. Patient also  requested to have all lights in the room on at all times due to visual deficits. Patient missed 25 min of skilled PT due to fatigue and pain, RN made aware. Will attempt to make-up missed time as able.     Therapy Documentation Precautions:  Precautions Precautions: Back, Fall, Other (comment)(has had issues w/orthostatic hypotension) Precaution Comments: Pt instructed in back precautions, and to self-monitor for activity tolerance, Required Braces or Orthoses: Spinal Brace Spinal Brace: Thoracolumbosacral orthotic, Applied in supine position Restrictions Weight Bearing Restrictions: No General: PT Amount of Missed Time (min): 25 Minutes PT Missed Treatment Reason: Patient fatigue;Pain    Therapy/Group: Individual Therapy  Peytan Andringa L Linlee Cromie PT, DPT  02/03/2020, 9:06 AM

## 2020-02-03 NOTE — Progress Notes (Signed)
Physical Therapy Session Note  Patient Details  Name: [name] [name] MRN: [id number] Date of Birth: 10-30-1956  Today's Date: 02/03/2020 PT Individual Time: 8250-0370 PT Individual Time Calculation (min): 25 min   Short Term Goals: Week 1:  PT Short Term Goal 1 (Week 1): STG=LTGs  Skilled Therapeutic Interventions/Progress Updates:     Patient in bed with NT completing vitals upon PT arrival. Patient alert and agreeable to PT session. Per NT, patient just completed a transfer to the [facility] with mod A for physical assist and patient was ambulating to the bathroom CGA yesterday. Patient looked distressed lying in the bed. Patient reported 10/10 back and R side pain and 8/10 headache during session, RN made aware and provided pain medicine during session. PT provided repositioning, rest breaks, and distraction as pain interventions throughout session. She reported dizziness in lying that improved once HOB elevated to take medication.  Therapeutic Activity: Bed Mobility: Patient performed rolling R/L with min A using bed rails for PT to assess her back and R side. Provided verbal cues for performing log rolling to maintain spinal precautions. Patient cried out in pain with lowering HOB flat and log rolling to each side. No new changes to skin or incisions, some edema to paraspinals and TTP over R lateral trunk, abdomen, and paraspinals. Patient reports her pain is worse than this morning. PT provided cues for diaphragmatic breathing and guided imagery for pain management with and after mobility. Patient reported that she was in too much pain to continue. Placed patient in comfortable position in the bed with HOB and knees elevated.   Patient in bed at end of session with breaks locked, bed alarm set, and all needs within reach. RN and Linna Hoff, [place] made aware of patient's current pain levels and limitations of mobility due to pain. Patient missed 5 min of skilled PT due to pain, RN made aware. Will attempt to  make-up missed time as able.     Therapy Documentation Precautions:  Precautions Precautions: Back, Fall, Other (comment)(has had issues w/orthostatic hypotension) Precaution Comments: Pt instructed in back precautions, and to self-monitor for activity tolerance, Required Braces or Orthoses: Spinal Brace Spinal Brace: Thoracolumbosacral orthotic, Applied in supine position Restrictions Weight Bearing Restrictions: No  Therapy/Group: Individual Therapy  Natalye Kott L Maigan Bittinger PT, DPT  02/03/2020, 4:17 PM

## 2020-02-04 ENCOUNTER — Inpatient Hospital Stay (HOSPITAL_COMMUNITY): Payer: PRIVATE HEALTH INSURANCE

## 2020-02-04 ENCOUNTER — Inpatient Hospital Stay (HOSPITAL_COMMUNITY): Payer: PRIVATE HEALTH INSURANCE | Admitting: *Deleted

## 2020-02-04 ENCOUNTER — Inpatient Hospital Stay (HOSPITAL_COMMUNITY): Payer: PRIVATE HEALTH INSURANCE | Admitting: Occupational Therapy

## 2020-02-04 ENCOUNTER — Encounter (HOSPITAL_COMMUNITY): Payer: Self-pay | Admitting: *Deleted

## 2020-02-04 ENCOUNTER — Inpatient Hospital Stay (HOSPITAL_COMMUNITY): Payer: PRIVATE HEALTH INSURANCE | Admitting: Physical Therapy

## 2020-02-04 LAB — GLUCOSE, CAPILLARY
Glucose-Capillary: 78 mg/dL (ref 70–99)
Glucose-Capillary: 104 mg/dL — ABNORMAL HIGH (ref 70–99)
Glucose-Capillary: 145 mg/dL — ABNORMAL HIGH (ref 70–99)
Glucose-Capillary: 166 mg/dL — ABNORMAL HIGH (ref 70–99)

## 2020-02-04 MED ORDER — BACLOFEN 5 MG HALF TABLET
5.0000 mg | ORAL_TABLET | Freq: Three times a day (TID) | ORAL | Status: DC | PRN
  Administered 2020-02-09: 08:00:00 5 mg via ORAL
  Filled 2020-02-04: qty 1

## 2020-02-04 NOTE — Progress Notes (Signed)
ANTICOAGULATION CONSULT NOTE - Follow Up Consult  Pharmacy Consult for Xarelto Indication: DVT  Allergies  Allergen Reactions  . Bee Pollen Anaphylaxis  . Fish-Derived Products Hives, Shortness Of Breath, Swelling and Rash    Hives get in throat causing trouble breathing  . Mushroom Extract Complex Anaphylaxis  . Penicillins Anaphylaxis    **Tolerated cefepime March 2021 Did it involve swelling of the face/tongue/throat, SOB, or low BP? Yes Did it involve sudden or severe rash/hives, skin peeling, or any reaction on the inside of your mouth or nose? No Did you need to seek medical attention at a hospital or doctor's office? Yes When did it last happen?A few months ago If all above answers are "NO", may proceed with cephalosporin use.  [name] Kitchen Rosemary Oil Anaphylaxis  . Shellfish Allergy Hives, Shortness Of Breath, Swelling and Rash  . Tomato Hives and Shortness Of Breath    Hives in throat causes her trouble breathing  . Acetaminophen Other (See Comments)    GI upset  . Acyclovir And Related Other (See Comments)    Unknown rxn  . Aloe Vera Hives  . Broccoli [Brassica Oleracea] Hives  . Naproxen Other (See Comments)    Unknown rxn    Patient Measurements: Wt: 81.7 kg   Vital Signs: Temp: 97.6 F (36.4 C) (04/07 0448) Temp Source: Oral (04/07 0448) BP: 165/78 (04/07 0448) Pulse Rate: 69 (04/07 0448)  Labs: Recent Labs    02/02/20 0324 02/03/20 0409  HGB 6.7* 8.1*  HCT 22.9* 26.9*  PLT 167 179  CREATININE 1.61* 1.48*    CrCl cannot be calculated (Unknown ideal weight.).   Medical History: Past Medical History:  Diagnosis Date  . Acute MI [facility]) 2007   presented to ED & had cardiac cath- but found to have normal coronaries. Since that point in time her PCP cares f or cardiac needs. Dr. [name] - [facility]  . Anemia   . Anginal pain ([place])   . Anxiety   . Asthma   . Bulging lumbar disc   . Cataract   . Chronic kidney disease    "had transplant when I  was 15; doesn't bother me now" (03/20/2013)  . Cirrhosis of liver without mention of alcohol   . Constipation   . Dehiscence of closure of skin    left partial calcaneal excision  . Depression   . Diabetes mellitus    insulin dependent, adult onset  . Episode of visual loss of left eye   . Exertional shortness of breath   . Fatty liver   . Fibromyalgia   . GERD (gastroesophageal reflux disease)   . Hepatic steatosis   . High cholesterol   . Hypertension   . MRSA (methicillin resistant Staphylococcus aureus)   . Neuropathy    lower legs  . Osteoarthritis    hands, hips  . Proximal humerus fracture 10/15/12   Left  . PTSD (post-traumatic stress disorder)   . Renal insufficiency 05/05/2015    Medications:  Medications Prior to Admission  Medication Sig Dispense Refill Last Dose  . atorvastatin (LIPITOR) 10 MG tablet Take 1 tablet by mouth once daily 90 tablet 1   . CALCIUM ALGINATE EX Apply 1 patch topically every other day.     . ceFEPime (MAXIPIME) IVPB Inject 2 g into the vein every 12 (twelve) hours. Indication:  Hardware associated vertebral infection Last Day of Therapy:  03/05/2020 Labs - Once weekly:  CBC/D and BMP, Labs - Every other week:  ESR  and CRP 80 Units 0   . DULoxetine (CYMBALTA) 60 MG capsule Take 1 capsule by mouth once daily (Patient taking differently: Take 60 mg by mouth daily. ) 90 capsule 1   . Ensure Max Protein (ENSURE MAX PROTEIN) LIQD Take 330 mLs (11 oz total) by mouth 2 (two) times daily. 3300 mL 0   . EPINEPHrine 0.3 mg/0.3 mL IJ SOAJ injection Inject into the muscle.     . fluticasone (FLONASE) 50 MCG/ACT nasal spray Place 2 sprays into both nostrils daily. (Patient not taking: Reported on 01/20/2020) 16 g 6   . HUMALOG KWIKPEN 200 UNIT/ML SOPN INJECT 13 UNITS SUBCUTANEOUSLY ONCE DAILY (Patient taking differently: Inject 13 Units into the skin daily. ) 6 mL 2   . HYDROcodone-acetaminophen (NORCO/VICODIN) 5-325 MG tablet Take 1-2 tablets by mouth every  6 (six) hours as needed for moderate pain or severe pain. 120 tablet 0   . Insulin Glargine (LANTUS SOLOSTAR) 100 UNIT/ML Solostar Pen INJECT 20 UNITS SUBCUTANEOUSLY ONCE DAILY IN THE MORNING AND THEN INJECT 40 UNITS AT BEDTIME (Patient taking differently: Inject 20-40 Units into the skin 2 (two) times daily. INJECT 20 UNITS SUBCUTANEOUSLY ONCE DAILY IN THE MORNING AND THEN INJECT 40 UNITS AT BEDTIME) 45 mL 0   . lidocaine (LIDODERM) 5 % Place 1 patch onto the skin daily. Remove & Discard patch within 12 hours or as directed by MD 30 patch 0   . Multiple Vitamins-Minerals (MULTIVITAMIN GUMMIES WOMENS) CHEW Chew 2 each by mouth daily.     . Naftifine HCl 2 % CREA Apply qd for up to 4 weeks (Patient not taking: Reported on 01/20/2020) 60 g 2   . ondansetron (ZOFRAN ODT) 4 MG disintegrating tablet Take 1 tablet (4 mg total) by mouth every 8 (eight) hours as needed for nausea or vomiting. 20 tablet 0   . ondansetron (ZOFRAN ODT) 4 MG disintegrating tablet Take 1 tablet (4 mg total) by mouth every 4 (four) hours as needed for nausea or vomiting. (Patient not taking: Reported on 01/20/2020) 20 tablet 0   . polyethylene glycol (MIRALAX / GLYCOLAX) packet Take 17 g by mouth daily as needed for mild constipation.      . pregabalin (LYRICA) 150 MG capsule Take 1 capsule (150 mg total) by mouth 2 (two) times daily. 180 capsule 1   . promethazine (PHENERGAN) 25 MG suppository Place 1 suppository (25 mg total) rectally every 6 (six) hours as needed for nausea or vomiting. (Patient not taking: Reported on 01/20/2020) 12 each 0     Assessment: 29 YOF with new bilateral DVTs. Pharmacy consulted to start Xarelto. Of note, patient fell recently but CT head yesterday was negative.   SCr improved to 1.48. CrCl ~ 60 ml/min. H/H improved since yesterday with transfusion. Plt wnl.     Goal of Therapy:  DVT treatment Monitor platelets by anticoagulation protocol: Yes   Plan:  -Xarelto 15 mg twice daily x 21 days, then  transition to Xarelto 20 mg daily.  -Monitor CBC, renal fx and s/s of bleeding    [name] Parr, PharmD., BCPS Clinical Pharmacist Clinical phone for 02/04/20 until 3:30pm: [phone] If after 3:30pm, please refer to [facility] for unit-specific pharmacist

## 2020-02-04 NOTE — Progress Notes (Signed)
Occupational Therapy Session Note  Patient Details  Name: [name] [name] MRN: [id number] Date of Birth: May 01, 1956  Today's Date: 02/04/2020 OT Individual Time: 1115-1145 OT Individual Time Calculation (min): 30 min    Short Term Goals: Week 1:  OT Short Term Goal 1 (Week 1): STG=LTG d/t ELOS  Skilled Therapeutic Interventions/Progress Updates:    1:1 Pt received from imaging in the room in the w/c. Pt performed grooming at the sink with setup. Pt declined to dress today due to fatigue but was able to recall donning clean underwear this morning. Pt oriented to falling the other day, her recent MRI, herself, place and current situation and talked about her therapy dog that helps at home. However still with limited awareness of why she shouldn't go home today due to safety and decr safe mobility. Pt perform stand step transfer to the toilet with BSC over the top  with bilateral shaking of knees with mobility requiring mod A. Pt able to perform toileting with max A - performing hygiene with setup. Pt then performed bed mobility to get back into bed with min A and supervision to get into supine; demonstrating safe body mechanics and back precautions. Left resting in the bed with bed alarm on.  Therapy Documentation Precautions:  Precautions Precautions: Back, Fall, Other (comment)(has had issues w/orthostatic hypotension) Precaution Comments: Pt instructed in back precautions, and to self-monitor for activity tolerance, Required Braces or Orthoses: Spinal Brace Spinal Brace: Thoracolumbosacral orthotic, Applied in supine position Restrictions Weight Bearing Restrictions: No General: General OT Amount of Missed Time: 30 Minutes; first 15 min of session missed due to being off floor for imaging; last 15 min due to fatigue Vital Signs:  Pain: Ongoing pain in back with bed mobility; once repositioned able to find rest  Therapy/Group: Individual Therapy  Willeen Cass [facility] 02/04/2020,  11:45 AM

## 2020-02-04 NOTE — Progress Notes (Signed)
Physical Therapy Session Note  Patient Details  Name: [name] [name] MRN: [id number] Date of Birth: 05-Apr-1956  Today's Date: 02/04/2020 PT Individual Time: 5638-7564 PT Individual Time Calculation (min): 55 min   Short Term Goals: Week 1:  PT Short Term Goal 1 (Week 1): STG=LTGs  Skilled Therapeutic Interventions/Progress Updates:     Patient in bed asleep with lunch tray untouched upon PT arrival. Patient aroused to tactile stimulation and required increased time to become alert due to lethargy. Once fully awake, she was agreeable to PT session. Patient reported 7-9/10 back pain during session, RN made aware. PT provided repositioning, rest breaks, and distraction as pain interventions throughout session. Patient initially perseverated on "Okay" when first asked how she was doing. With more prompting and redirection she stopped perseverating the remainder of the session. When asked, she was able to recall that she did not sleep well last night and stated "I was confused about where I was." Patient requested to eat lunch, but concerned about missing therapy time. PT suggested eating lunch sitting EOB to work on sitting balance for therapy, patient agreeable. Donned TLSO brace in supine with total A and min A for rolling with use of bed rails. She attempted to sit EOB with the bed flat, however, cried out in pain and was unable to tolerate coming to sitting. Placed HOB elevated ~45 degrees and patient was able to sit EOB from side-lying with min A and use of bed rails. Patient sat EOB x20 min eating lunch. She had increased UE "jumping" causing her to drop some of her food that increased over time with fatigue. Patient required min-total A for feeding throughout, RN made aware. She required close supervision for sitting balance once her feet were on the floor, however, required min A with fatigue around 15 min. She performed a sit to stand EOB using the RW with min A then B knees buckled due to a  "jerk" and she required mod A to remain standing then requested to sit. Stood again with B UE support on PT with min A and transferred stand pivot to the w/c with min-mod A for support. Once in the w/c, patient stated she was very tired and that she wanted to rest. Patient was agreeable to riding in the w/c to the Day room to look at activity options. Encouraged patient to stay awake as able to promote more sleep tonight, patient was agreeable. Once in the Day room the patient became very emotional and tearful about wanting to go home and asked to go back to the room. Patient transported back to the room and PT comforted patient and discussed recovery. Patient no longer tearful and willing to try ambulating back to the bed after. She ambulated 3 ft to the bed with min-mod A with B UE on PT's arms and sat back on the bed with min A. She performed sit to supine with min A for LE support in a flat bed without use of bed rails. Performed log rolling as above to doff TLSO with total A. Once in bed the patient requested to stop therapy so she could rest. Patient in bed with breaks locked, bed alarm set, and all needs in reach. Patient missed 20 min of skilled PT due to fatigue, RN made aware. Will attempt to make-up missed time as able.      Therapy Documentation Precautions:  Precautions Precautions: Back, Fall, Other (comment)(has had issues w/orthostatic hypotension) Precaution Comments: Pt instructed in back precautions, and to  self-monitor for activity tolerance, Required Braces or Orthoses: Spinal Brace Spinal Brace: Thoracolumbosacral orthotic, Applied in supine position Restrictions Weight Bearing Restrictions: No General: PT Amount of Missed Time (min): 20 Minutes    Therapy/Group: Individual Therapy  Kayce Betty L Fredrick Dray PT, DPT  02/04/2020, 4:07 PM

## 2020-02-04 NOTE — Progress Notes (Signed)
Physical Therapy Session Note  Patient Details  Name: [name] [name] MRN: [id number] Date of Birth: 12/09/1955  Today's Date: 02/04/2020 PT Individual Time: 0845-1000 PT Individual Time Calculation (min): 75 min   Short Term Goals: Week 1:  PT Short Term Goal 1 (Week 1): STG=LTGs  Skilled Therapeutic Interventions/Progress Updates: Pt presented in bed agreeable to therapy. Pt states some minimal pain but per nsg was premedicated. Pt performed rolling L/R with supervision and use of bed rail to allow PTA to don TLSO total A. Pt performed supine to sit with minA and use of bed features with increased time due to pain. Pt indicated need for bathroom. Pt performed stand pivot transfer to w/c with minA for STS from EOB. Transported pt to toilet and performed stand pivot with use of wall rail and CGA to sit at toilet (+void/BM). PTA provided total A once completed for peri-care so as to maintain spinal precautions and pt performed stand pivot back to w/c in same manner as prior. Pt performed hand hygiene at sink w/c level and transported to day room total A for energy conservation. Pt performed Stand pivot with RW to NuStep with CGA overall. Pt participated in NuStep L2 x 10 min for global conditioning. Pt returned to w/c in same manner as prior and transported back to room. Pt then participated in [place], hip flexion, and hamstring pulls with level 2 resistance band 2 x 10 bilaterally. Pt agreeable to remain in w/c at end of session with belt alarm placed, call bell within reach and needs met.      Therapy Documentation Precautions:  Precautions Precautions: Back, Fall, Other (comment)(has had issues w/orthostatic hypotension) Precaution Comments: Pt instructed in back precautions, and to self-monitor for activity tolerance, Required Braces or Orthoses: Spinal Brace Spinal Brace: Thoracolumbosacral orthotic, Applied in supine position Restrictions Weight Bearing Restrictions: No    Therapy/Group:  Individual Therapy  [name] [name]  [name] [name], PTA  02/04/2020, 12:59 PM

## 2020-02-04 NOTE — Progress Notes (Signed)
This nurse and [place] LPN assisted the pt to the [facility]. The transfer to the [facility] pt acted as if she could not get her balance. After voiding, the pt began to wipe her self using her underwear. Pt was corrected and given commands to use the tissue provided to wipe. Pt appeared confused and unsure of what to do. Pt began a jerking motion. Pt denied being cold, but stated " I do not know how my body does this jerking, how do I make it stop". Pt stopped the jerking motion after 3 mins. After this nurse cleaned the pt, the pt began to place her hands in the vaginal area. Pt was encouraged to stop and allow staff to clean her hands. Pt was then assisted to standing position, pt appeared to confused and was unable to follow commands to ambulate to the bed. Once sitting on the bed pt states " so glad we are in [facility] now. Can I got to the bathroom?" Pt attempted to lay in the bed the opposite way even after given verbal cues on the proper way to lay down. Once in the supine position, pt continued to mumble to ehr self  "oh yeaa, we are here, oh yea. [place]. Oh yea, oh yea". Pt denies pain, no signs of distress at this time. Pt in bed, call light in reach, bed alarm on.

## 2020-02-04 NOTE — Progress Notes (Signed)
Pharmacy Antibiotic Note  [name] [name] is a 64 y.o. 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 S/p lumbar debridement and hardware stabilization on 3/25.  Pharmacy is consulted for cefepime dosing.  Afebrile, WBC wnl, and renal function has improved some, est nClCr ~44 ml/min.    Of note, patient has had a change in mental status recently. CT head was neg. MRI pending.   Plan Continue cefepime 2 gm IV q12h, end date 03/05/20 Watch renal fxn and adjust dose as indicated Consider consulting OPAT to enter home infusion antibiotic orders    Temp (24hrs), Avg:98.1 F (36.7 C), Min:97.6 F (36.4 C), Max:98.6 F (37 C)  Recent Labs  Lab 01/29/20 0422 02/02/20 0324 02/03/20 0409  WBC 6.5 5.6 6.6  CREATININE 1.24* 1.61* 1.48*    CrCl cannot be calculated (Unknown ideal weight.).    Antimicrobials this admission: Levofloxacin 3/25 >> 3/26 Vancomycin 3/25 >> 3/27 Rifampin 3/26 >> 3/27 Cefepime 3/27 >> (5/7)  Microbiology this admission: 3/23 BCx >> neg 3/23 COVID negative 3/24 MRSA PCR negative 3/25 Bone bx > rare Pseudomonas, S to cefepime  Thank you for involving pharmacy in this patient's care.  [name] Parr, PharmD., BCPS, BCCCP Clinical Pharmacist Clinical phone for 02/04/20 until 3:30pm: [phone] If after 3:30pm, please refer to [facility] for unit-specific pharmacist

## 2020-02-04 NOTE — Progress Notes (Signed)
This nurse and [place] NT took the pts AM vitals and performed CHG bath. Pt has resumed the same behavior as mentioned earlier in the shift. Pt was unable to follow commands when taking the temp and pulse. When communicating with the pt, the pt continued to mumble and repeatedly " [place]" even when the pt was not asked about her location. Pt is oriented person at this time. Pt continues to state "can I get up now, I want to go home, im not suppose to be in [place]". Pt was reoriented to time/place/situation but continued to say "[place]" repeatedly. Pt denies being in pain. No signs of distress at this time.

## 2020-02-04 NOTE — Progress Notes (Signed)
Recreational Therapy Session Note  Patient Details  Name: [name] [name] MRN: [id number] Date of Birth: November 07, 1955 Today's Date: 02/04/2020  Pt declined participation today, stating she was too tired.  Will attempt evaluation at a later date this week.  [place] 02/04/2020, 3:32 PM

## 2020-02-04 NOTE — Progress Notes (Signed)
[place] PHYSICAL MEDICINE & REHABILITATION PROGRESS NOTE   Subjective/Complaints:   Pt reports feeling OK- denies any pain currently- LBM yesterday. Very sing song in her responses and didn't seem to understand what I asking a few times.   Also notes she slept well- although nursing notes say she was confused all night.   When I spoke ot pharmacy regarding pt, he mentioned rare cases of pt's getting confused with Cefipime. MRI of brain was negative for anything acute- will monitor- might call ID.   ROS:   Pt denies SOB, abd pain, CP, N/V/C/D, and vision changes   Objective:   CT HEAD WO CONTRAST  Result Date: 02/03/2020 CLINICAL DATA:  Head trauma, fall EXAM: CT HEAD WITHOUT CONTRAST TECHNIQUE: Contiguous axial images were obtained from the base of the skull through the vertex without intravenous contrast. COMPARISON:  11/12/2019 FINDINGS: Brain: No evidence of acute infarction, hemorrhage, hydrocephalus, extra-axial collection or mass lesion/mass effect. Vascular: No hyperdense vessel or unexpected calcification. Skull: Normal. Negative for fracture or focal lesion. Sinuses/Orbits: No acute finding. Other: None. IMPRESSION: No acute intracranial pathology. Electronically Signed   By: Eddie Candle M.D.   On: 02/03/2020 10:38   Recent Labs    02/02/20 0324 02/03/20 0409  WBC 5.6 6.6  HGB 6.7* 8.1*  HCT 22.9* 26.9*  PLT 167 179   Recent Labs    02/02/20 0324 02/03/20 0409  NA 141 139  K 4.1 4.1  CL 106 105  CO2 28 27  GLUCOSE 172* 192*  BUN 18 18  CREATININE 1.61* 1.48*  CALCIUM 8.4* 8.3*    Intake/Output Summary (Last 24 hours) at 02/04/2020 0911 Last data filed at 02/03/2020 1900 Gross per 24 hour  Intake 300 ml  Output --  Net 300 ml     Physical Exam: Vital Signs Blood pressure (!) 165/78, pulse 69, temperature 97.6 F (36.4 C), temperature source Oral, resp. rate 16, SpO2 96 %.  Constitutional slow to process, laying on L side facing door in bed;  appropriate, but very vague, having difficulties processing simple questions, NAD HEENT: conjugate gaze Neck: supple Cardiovascular: RRR_ no M/R/G   Respiratory/Chest: CTA B/L- no accessory muscle use GI/Abdomen: soft, NT, ND, (+)BS Ext: no clubbing, cyanosis, or edema Psych: flat, tearful affect Musculoskeletal:  TTP over posterior head- no bumps/lesions from fall TTP over thoracic and lumbar paraspinals however not on spine itself- no TTP midline except normal over incision area.  Cervical back: Normal range of motion and neck supple.  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  Skin:  Back incision CDI  L heel ulcer- bandaged C/D/I Healed scars on R elbow and R foot      Assessment/Plan: 1. Functional deficits secondary to osteomyelitis of vertebral of thoracolumbar region. Status post L1 corpectomy with pedicle screw fixation T11, 12, L2 and L3 01/22/2020 which require 3+ hours per day of interdisciplinary therapy in a comprehensive inpatient rehab setting.  Physiatrist is providing close team supervision and 24 hour management of active medical problems listed below.  Physiatrist and rehab team continue to assess barriers to discharge/monitor patient progress toward functional  and medical goals  Care Tool:  Bathing    Body parts bathed by patient: Right arm, Left arm, Chest, Abdomen, Front perineal area, Right upper leg, Left upper leg, Face   Body parts bathed by helper: Buttocks, Left lower leg, Right lower leg     Bathing assist Assist Level: Minimal Assistance - Patient >  75%     Upper Body Dressing/Undressing Upper body dressing   What is the patient wearing?: Pull over shirt Orthosis activity level: Performed by helper  Upper body assist Assist Level: Minimal Assistance - Patient > 75%    Lower Body Dressing/Undressing Lower body dressing      What is the patient wearing?: Incontinence brief, Pants     Lower body assist Assist for lower body dressing: Minimal Assistance - Patient > 75%     Toileting Toileting    Toileting assist Assist for toileting: Moderate Assistance - Patient 50 - 74%     Transfers Chair/bed transfer  Transfers assist     Chair/bed transfer assist level: Minimal Assistance - Patient > 75%     Locomotion Ambulation   Ambulation assist      Assist level: Minimal Assistance - Patient > 75% Assistive device: Walker-rolling Max distance: 12'   Walk 10 feet activity   Assist     Assist level: Minimal Assistance - Patient > 75% Assistive device: Walker-rolling   Walk 50 feet activity   Assist Walk 50 feet with 2 turns activity did not occur: Safety/medical concerns  Assist level: Contact Guard/Touching assist Assistive device: Walker-rolling    Walk 150 feet activity   Assist Walk 150 feet activity did not occur: Safety/medical concerns         Walk 10 feet on uneven surface  activity   Assist     Assist level: Moderate Assistance - Patient - 50 - 74% Assistive device: Aeronautical engineer Will patient use wheelchair at discharge?: No             Wheelchair 50 feet with 2 turns activity    Assist            Wheelchair 150 feet activity     Assist          Blood pressure (!) 165/78, pulse 69, temperature 97.6 F (36.4 C), temperature source Oral, resp. rate 16, SpO2 96 %.  Medical Problem List and Plan:  1. Decreased functional mobility secondary to osteomyelitis of vertebral of thoracolumbar region. Status post L1 corpectomy with  pedicle screw fixation T11, 12, L2 and L3 01/22/2020. TLSO back brace donned and supine.  -patient may Shower- cover incision- might need 2 TLSOs  4/6- had fall last night- has 8/10 headache and paraspinal soreness/pain- will check CT of head -ELOS/Goals: ~ 8-12 days- goals supervision to min assist  2. Antithrombotics:  -DVT/anticoagulation: bilateral post-tib and peroneal DVT's.    -xarelto initiated yesterday   4/3 -reviewed plan/treatment with patient -antiplatelet therapy: N/A  3. Pain Management: Lyrica 150 mg twice daily, Lidoderm patch, Valium as needed muscle spasms as well as oxycodone as needed   4/5- due to descriptions of spasticity- will try Adding Baclofen 5 mg TID  4/7- Will change Baclofen to 5 mg TID prn- since having increased confusion 4. Mood: Cymbalta 60 mg daily  -antipsychotic agents: N/A  5. Neuropsych: This patient is capable of making decisions on her own behalf.  6. Skin/Wound Care: Routine skin checks  7. Fluids/Electrolytes/Nutrition: Routine in and outs  with follow-up chemistries  8. ID. 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 Follow-up per infectious disease  9. Diabetes mellitus. Lantus insulin as directed.    CBG (last 3)  Recent Labs    02/03/20 1705 02/03/20 2051 02/04/20 0611  GLUCAP 119* 113* 78      4/6- change Lantus to 34 units due to elevated BGs  4/7- great BG control- con't regimen 10. CKD stage III. Follow-up chemistries  4/2- Cr stable at 1.24- recheck Monday  4/5- Cr up to 1.61- could be due to being dry- will recheck in AM after 1 unit pRBCs  4/6- Cr down to 1.48- has improved- con't to push fluids 11. 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 Wound care left foot as directed. Follow-up per Dr. Sharol Given  12. Acute on chronic anemia. Follow-up CBC   4/1- Hb 7.3- might need transfusion- will monitor   4/4 Recheck CBC 4/5  4/5- CBC Hb  6.7- s/p 1 unit pRBCs- will recheck in AM  4/6- Hb up to 8.1- con't regimen-  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 Patient did have assistance at home. Has "seeing eye dog" and husband  26. Constipation. Continue Senokot. MiraLAX as needed. 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 Fall overnight with new confusion  4/6- getting head CT and monitor- strength unchanged- appears in pain and tremors slightly worse- no other changes  4/7- MRI of brain negative for anything acute- still confused per staff- will make baclofen prn; also could be from IV ABX per pharmacist- will monitor closely.     LOS: 6 days A FACE TO FACE EVALUATION WAS PERFORMED  Gaynor Ferreras 02/04/2020, 9:11 AM

## 2020-02-04 NOTE — Significant Event (Signed)
Rapid Response Event Note  Overview:Called d/t AMS, repetitive speaking. Pt usually alert and oriented per RN. Pt had an unwitnessed fall last night where it is unknown whether she hit her head or not. CT head this AM was negative. T-97.8, HR-82, BP-178/79, RR-18, SpO2-97% on RA.  Initial Focused Assessment: Pt laying in bed with eyes closed, opens eyes when spoken to. Pt is alert and oriented x4. Pt denies pain. No repetitive speech noted. Pt follows commands and moves all extremities. Pt wants everyone to leave her alone and let her sleep.   Interventions: No RRT interventions Plan of Care (if not transferred): Continue to monitor pt. Call RRT if further assistance needed.  Event Summary: Called:2222 Arrived: 2224 Ended: 2229    Dillard [place]

## 2020-02-05 ENCOUNTER — Inpatient Hospital Stay (HOSPITAL_COMMUNITY): Payer: PRIVATE HEALTH INSURANCE | Admitting: Occupational Therapy

## 2020-02-05 ENCOUNTER — Inpatient Hospital Stay (HOSPITAL_COMMUNITY): Payer: PRIVATE HEALTH INSURANCE

## 2020-02-05 LAB — CBC WITH DIFFERENTIAL/PLATELET
Abs Immature Granulocytes: 0.03 10*3/uL (ref 0.00–0.07)
Basophils Absolute: 0.1 10*3/uL (ref 0.0–0.1)
Basophils Relative: 1 %
Eosinophils Absolute: 0.2 10*3/uL (ref 0.0–0.5)
Eosinophils Relative: 3 %
HCT: 28.4 % — ABNORMAL LOW (ref 36.0–46.0)
Hemoglobin: 8.3 g/dL — ABNORMAL LOW (ref 12.0–15.0)
Immature Granulocytes: 0 %
Lymphocytes Relative: 18 %
Lymphs Abs: 1.4 10*3/uL (ref 0.7–4.0)
MCH: 25.8 pg — ABNORMAL LOW (ref 26.0–34.0)
MCHC: 29.2 g/dL — ABNORMAL LOW (ref 30.0–36.0)
MCV: 88.2 fL (ref 80.0–100.0)
Monocytes Absolute: 0.6 10*3/uL (ref 0.1–1.0)
Monocytes Relative: 8 %
Neutro Abs: 5.4 10*3/uL (ref 1.7–7.7)
Neutrophils Relative %: 70 %
Platelets: 176 10*3/uL (ref 150–400)
RBC: 3.22 MIL/uL — ABNORMAL LOW (ref 3.87–5.11)
RDW: 15.5 % (ref 11.5–15.5)
WBC: 7.7 10*3/uL (ref 4.0–10.5)
nRBC: 0 % (ref 0.0–0.2)

## 2020-02-05 LAB — GLUCOSE, CAPILLARY
Glucose-Capillary: 123 mg/dL — ABNORMAL HIGH (ref 70–99)
Glucose-Capillary: 138 mg/dL — ABNORMAL HIGH (ref 70–99)
Glucose-Capillary: 143 mg/dL — ABNORMAL HIGH (ref 70–99)
Glucose-Capillary: 276 mg/dL — ABNORMAL HIGH (ref 70–99)

## 2020-02-05 LAB — BASIC METABOLIC PANEL
Anion gap: 8 (ref 5–15)
BUN: 17 mg/dL (ref 8–23)
CO2: 27 mmol/L (ref 22–32)
Calcium: 8.2 mg/dL — ABNORMAL LOW (ref 8.9–10.3)
Chloride: 107 mmol/L (ref 98–111)
Creatinine, Ser: 1.59 mg/dL — ABNORMAL HIGH (ref 0.44–1.00)
GFR calc Af Amer: 39 mL/min — ABNORMAL LOW (ref >60)
GFR calc non Af Amer: 34 mL/min — ABNORMAL LOW (ref >60)
Glucose, Bld: 148 mg/dL — ABNORMAL HIGH (ref 70–99)
Potassium: 4 mmol/L (ref 3.5–5.1)
Sodium: 142 mmol/L (ref 135–145)

## 2020-02-05 MED ORDER — SODIUM CHLORIDE 0.9 % IV SOLN: INTRAVENOUS | Status: AC

## 2020-02-05 NOTE — Progress Notes (Signed)
Orthopedic Tech Progress Note Patient Details:  [name] [name] 08/25/1956 [id number]  [name], adult   DOB: Dec 16, 1955, 64 y.o.   MRN: [id number]   [name] [name] 02/05/2020, 2:01 PMRouted brace order to [employer]

## 2020-02-05 NOTE — Progress Notes (Signed)
Physical Therapy Weekly Progress Note  Patient Details  Name: [name] [name] MRN: [id number] Date of Birth: 1955/11/04  Beginning of progress report period: January 30, 2020 End of progress report period: February 05, 2020  Today's Date: 02/05/2020 PT Individual Time: 1157-2620 PT Individual Time Calculation (min): 60 min   Patient was progressing well towards long term goals for the first few days with therapy, then had 2 days of slow progress with decrease in functional mobility following a fall, now has returned to prior level of function today. She currently requires min A-CGA for all mobility with a RW and mod A 2 steps with B rails. She has been emotional about the fall and wanting to go home. Neuropsych is following patient, will continue to provide emotional support and encourage progression with mobility to achieve patient's goals for safe d/c home. Working on determining if patient will be able to have 24/7 supervision at home.  Patient continues to demonstrate the following deficits muscle weakness, decreased cardiorespiratoy endurance, impaired timing and sequencing, unbalanced muscle activation and decreased coordination, decreased visual acuity and decreased visual perceptual skills, decreased awareness and decreased memory and decreased sitting balance, decreased standing balance, decreased postural control, decreased balance strategies and difficulty maintaining precautions and therefore will continue to benefit from skilled PT intervention to increase functional independence with mobility.  Patient progressing toward long term goals..  Continue plan of care.  PT Short Term Goals Week 1:  PT Short Term Goal 1 (Week 1): STG=LTGs Week 2:  PT Short Term Goal 1 (Week 2): STG = LTG due to ELOS.  Skilled Therapeutic Interventions/Progress Updates:     Patient in w/c with TLSO donned upon PT arrival. Patient alert and agreeable to PT session, patient's emotional state and affect much  improved today. Patient reported 6-7/10 back pain during session, RN made aware. PT provided repositioning, rest breaks, and distraction as pain interventions throughout session.   Therapeutic Activity: Transfers: Patient performed sit to/from stand x3 with CGA using a RW. Provided verbal cues for forward weight shift and reaching back to sit for safety.  Gait Training:  Patient ambulated 39 feet and 21 feet using RW with CGA. Ambulated with decreased B step length, mild posterior LOB x3, and decreased step height. Provided verbal cues for directions due to visual deficits and increased step height for safety.  Wheelchair Mobility:  Patient was transported in the w/c with total A throughout session for energy conservation and time management.  Neuromuscular Re-ed: Patient performed the following standing balance and motor control activities: -sit to stands without UE support for LE strengthening and motor control with a functional activity 2x10 -Standing alternating marching 2x10 with standing rest break in between, first trial with B UE support, second trial with 1 UE support for increased balance challenge  Assessed patient's L heel after all mobility. Removed dressing to revele a large wound with significant escar covering unstagable ulcer. RN, Linna Hoff, PA, and Dr. Dagoberto Ligas, MD brought in to assess wound and determine POC for wound and weight bearing restrictions. Wound care to be consulted and orders placed for heel off loading shoe.  Patient in w/c in room at end of session with breaks locked, seat belt alarm set, and all needs within reach.    Therapy Documentation Precautions:  Precautions Precautions: Back, Fall, Other (comment)(has had issues w/orthostatic hypotension) Precaution Comments: Pt instructed in back precautions, and to self-monitor for activity tolerance, Required Braces or Orthoses: Spinal Brace Spinal Brace: Thoracolumbosacral orthotic, Applied in supine  position Restrictions Weight Bearing Restrictions: No   Therapy/Group: Individual Therapy  Jurrell Royster L Morene Cecilio PT, DPT  02/05/2020, 5:48 PM

## 2020-02-05 NOTE — Progress Notes (Signed)
[facility]ICINE [facility]ILITATION PROGRESS NOTE   Subjective/Complaints:   Pt reports had diarrhea 5-6x overnight and was liquid, however per chart, had 1 BM and was soft/mushy, but not liquid.   Previous BMs the night prior x3, but already decreased bowel meds.  Also says feels very uncomfortable with female staff- wants only female nursing/CNAs.   Feels like her cognition has improved and her memory is better.    ROS:   Pt denies SOB, abd pain, CP, N/V/C/D, and vision changes   Objective:   CT HEAD WO CONTRAST  Result Date: 02/03/2020 CLINICAL DATA:  Head trauma, fall EXAM: CT HEAD WITHOUT CONTRAST TECHNIQUE: Contiguous axial images were obtained from the base of the skull through the vertex without intravenous contrast. COMPARISON:  11/12/2019 FINDINGS: Brain: No evidence of acute infarction, hemorrhage, hydrocephalus, extra-axial collection or mass lesion/mass effect. Vascular: No hyperdense vessel or unexpected calcification. Skull: Normal. Negative for fracture or focal lesion. Sinuses/Orbits: No acute finding. Other: None. IMPRESSION: No acute intracranial pathology. Electronically Signed   By: Eddie Candle M.D.   On: 02/03/2020 10:38   MR BRAIN WO CONTRAST  Result Date: 02/04/2020 CLINICAL DATA:  Delirium, fall with headache EXAM: MRI HEAD WITHOUT CONTRAST TECHNIQUE: Multiplanar, multiecho pulse sequences of the brain and surrounding structures were obtained without intravenous contrast. COMPARISON:  2015 FINDINGS: Brain: There is no acute infarction or intracranial hemorrhage. There is no parenchymal mass. 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 Ventricles are normal in size and configuration. There is no edema, hydrocephalus, or extra-axial fluid collection. Minimal small foci of T2 hyperintensity in the supratentorial white matter are nonspecific but may reflect minor chronic microvascular ischemic changes. Vascular: Major  vessel flow voids at the skull base are preserved. Skull and upper cervical spine: Normal marrow signal is preserved. Sinuses/Orbits: Mild ethmoid mucosal thickening. Left lens replacement. Other: Sella is unremarkable. Mild patchy mastoid fluid opacification. Degenerative changes of the temporomandibular joints. IMPRESSION: No acute infarction, hemorrhage, or mass. Stable chronic findings detailed above. Electronically Signed   By: Macy Mis M.D.   On: 02/04/2020 11:08   Recent Labs    02/03/20 0409 02/05/20 0504  WBC 6.6 7.7  HGB 8.1* 8.3*  HCT 26.9* 28.4*  PLT 179 176   Recent Labs    02/03/20 0409 02/05/20 0504  NA 139 142  K 4.1 4.0  CL 105 107  CO2 27 27  GLUCOSE 192* 148*  BUN 18 17  CREATININE 1.48* 1.59*  CALCIUM 8.3* 8.2*    Intake/Output Summary (Last 24 hours) at 02/05/2020 0914 Last data filed at 02/05/2020 0839 Gross per 24 hour  Intake 590 ml  Output --  Net 590 ml     Physical Exam: Vital Signs Blood pressure (!) 153/63, pulse 84, temperature 98.2 F (36.8 C), temperature source Oral, resp. rate 18, SpO2 96 %.  Constitutional : much more awake, alert, more talkative, not slow to process today, NAD; sitting up in bed HEENT: conjugate gaze;  Neck: supple Cardiovascular: RRR; no JVD Respiratory/Chest: CTA B/L- good air movement GI/Abdomen: soft, NT, ND, (+)BS Ext: no clubbing, cyanosis, or edema Psych: calmer; brighter Musculoskeletal:  TTP over posterior head- no bumps/lesions from fall TTP over thoracic and lumbar paraspinals however not on spine itself- no TTP midline except normal over incision area.  Cervical back: Normal range of motion and neck supple.  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 Functional deficits secondary to osteomyelitis of vertebral of thoracolumbar region. Status post L1 corpectomy with pedicle screw fixation T11, 12, L2 and L3 01/22/2020 which require 3+ hours per day of interdisciplinary therapy in a comprehensive inpatient rehab setting.  Physiatrist is providing close team supervision and 24 hour management of active medical problems listed below.  Physiatrist and rehab team continue to assess barriers to discharge/monitor patient progress toward functional and medical goals  Care Tool:  Bathing    Body parts bathed by patient: Right arm, Left arm, Chest, Abdomen, Front perineal area, Right upper leg, Left upper leg, Face   Body parts bathed by helper: Buttocks, Left lower leg, Right lower leg     Bathing assist Assist Level: Minimal Assistance - Patient > 75%     Upper Body Dressing/Undressing Upper body dressing   What is the patient wearing?: Pull over shirt Orthosis activity level: Performed by helper  Upper body assist Assist Level: Minimal Assistance - Patient > 75%    Lower Body Dressing/Undressing Lower body dressing      What is the patient wearing?: Incontinence brief, Pants     Lower body assist Assist for lower body dressing: Minimal Assistance - Patient > 75%     Toileting Toileting    Toileting assist Assist for toileting: Moderate Assistance - Patient 50 - 74%     Transfers Chair/bed transfer  Transfers assist     Chair/bed transfer assist level: Moderate Assistance - Patient 50 - 74%     Locomotion Ambulation   Ambulation assist      Assist level: Minimal Assistance - Patient > 75% Assistive device: Hand held assist(B HHA) Max distance: 3 ft   Walk 10 feet  activity   Assist     Assist level: Minimal Assistance - Patient > 75% Assistive device: Walker-rolling   Walk 50 feet activity   Assist Walk 50 feet with 2 turns activity did not occur: Safety/medical concerns  Assist level: Contact Guard/Touching assist Assistive device: Walker-rolling    Walk 150 feet activity   Assist Walk 150 feet activity did not occur: Safety/medical concerns         Walk 10 feet on uneven surface  activity   Assist     Assist level: Moderate Assistance - Patient - 50 - 74% Assistive device: Aeronautical engineer Will patient use wheelchair at discharge?: No             Wheelchair 50 feet with 2 turns activity    Assist            Wheelchair 150 feet activity     Assist          Blood pressure (!) 153/63, pulse 84, temperature 98.2 F (36.8 C), temperature source Oral, resp. rate 18, SpO2 96 %.  Medical Problem List and Plan:  1. Decreased functional mobility secondary to osteomyelitis of vertebral of thoracolumbar region. Status post L1 corpectomy with pedicle screw fixation T11, 12, L2 and L3 01/22/2020. TLSO  back brace donned and supine.  -patient may Shower- cover incision- might need 2 TLSOs  4/6- had fall last night- has 8/10 headache and paraspinal soreness/pain- will check CT of head  4/8- recovered from fall- HA gone- CT (-) MRI (-) -ELOS/Goals: ~ 8-12 days- goals supervision to min assist  2. Antithrombotics:  -DVT/anticoagulation: bilateral post-tib and peroneal DVT's.    -xarelto initiated yesterday   4/3 -reviewed plan/treatment with patient -antiplatelet therapy: N/A  3. Pain Management: Lyrica 150 mg twice daily, Lidoderm patch, Valium as needed muscle spasms as well as oxycodone as needed   4/5- due to descriptions of spasticity- will try Adding Baclofen 5 mg TID  4/7- Will change Baclofen to 5 mg TID prn- since having increased confusion  4/8- pt much more with it, not  sure if time or baclofen prn that helped 4. Mood: Cymbalta 60 mg daily  -antipsychotic agents: N/A  5. Neuropsych: This patient is capable of making decisions on her own behalf.  6. Skin/Wound Care: Routine skin checks  7. Fluids/Electrolytes/Nutrition: Routine in and outs with follow-up chemistries  8. ID. 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 Follow-up per infectious disease  9. Diabetes mellitus. Lantus insulin as directed.    CBG (last 3)  Recent Labs    02/04/20 1704 02/04/20 2052 02/05/20 0632  GLUCAP 145* 166* 123*       4/8- good control of BGs- con't regimen 10. CKD stage III. Follow-up chemistries  4/2- Cr stable at 1.24- recheck Monday  4/5- Cr up to 1.61- could be due to being dry- will recheck in AM after 1 unit pRBCs  4/6- Cr down to 1.48- has improved- con't to push fluids  4/8- Cr 1.59- over last 2 weeks, running 1.2-1.6- does have CKD Stage III- but could be IV ABX- will give IVFs low dose x 24 hours- 60cc NS 11. 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 Wound care left foot as directed. Follow-up per Dr. Sharol Given  12. Acute on chronic anemia. Follow-up CBC   4/1- Hb 7.3- might need transfusion- will monitor   4/4 Recheck CBC 4/5  4/5- CBC Hb 6.7- s/p 1 unit pRBCs- will recheck in AM  4/6- Hb up to 8.1- con't regimen-  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 Patient did have assistance at home. Has "seeing eye dog" and husband  23. Constipation. Continue Senokot. MiraLAX as needed. 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 Fall overnight with new confusion  4/6-  getting head CT and monitor- strength unchanged- appears in pain and tremors slightly worse- no other changes  4/7- MRI of brain negative for anything acute- still confused per staff- will make baclofen prn; also could be from IV ABX per pharmacist- will monitor closely.   4/8- cognition is better today- MRI (-)   LOS: 7 days A FACE TO FACE EVALUATION WAS PERFORMED  [name] [name] 02/05/2020, 9:14 AM

## 2020-02-05 NOTE — Consult Note (Signed)
WOC Nurse Consult Note: Patient transferred to Rehab [phone]) from Inpatient Acute Care.  Reason for Consult: Left heel unstageable Wound type: Chronic, non-healing Pressure Injury POA: Yes/No/NA Measurement: see flowsheet Wound bed: dry, hard Drainage (amount, consistency, odor) none Periwound: callused Dressing procedure/placement/frequency: Orders are in the record for care of this area.  Please follow these recommendations. Monitor the wound area(s) for worsening of condition such as: Signs/symptoms of infection,  Increase in size,  Development of or worsening of odor, Development of pain, or increased pain at the affected locations.  Notify the medical team if any of these develop.  Thank you for the consult.  [facility] nurse will not follow at this time.  Please re-consult the [facility] team if needed.  Val Riles, RN, MSN, CWOCN, CNS-BC, pager [phone]

## 2020-02-05 NOTE — Progress Notes (Signed)
Occupational Therapy Session Note  Patient Details  Name: [name] [name] MRN: [id number] Date of Birth: October 07, 1956  Today's Date: 02/05/2020 OT Individual Time: 8472-0721 & 1300-1415 OT Individual Time Calculation (min): 60 min & 75 min   Short Term Goals: Week 1:  OT Short Term Goal 1 (Week 1): STG=LTG d/t ELOS  Skilled Therapeutic Interventions/Progress Updates:    AM session:   Patient in bed, alert and pleasant.  No confusion or distress during session.  Applied brace (dependent) via rolling side to side in bed.  Side lying to sitting with CS.  SPT bed to w/c CGA.  SPT w/c to toilet with CGA.  She requires min a for pants down, max A for hygiene and washing buttocks in stance.  Returned to bed CGA.  Siting to side lying with CS.  Removed brace in supine where she completed dressing activities.  She is able to cross legs to reach feet for LB dressing tasks - min a overall.  She is able to remove night gown via log roll and pulling OH with CS and cues.  She completed UB bathing bed level with set up.  Donned OH shirt with set up.  Donned brace via rolling side to side dependent.  Side lying to sitting with CS.  SPT to w/c CGA.  She completed grooming seated in w/c at sink with set up.  Completed standing balance activities with CGA.  Completed seated UB/LB AROM exercises with min cues.  She remained seated in w/c at close of session, seat belt alarm set and call bell in reach.    PM session:  Patient seated in w/c, has finished lunch and denies pain at this time.  She reports that she does not need to void.  Completed short distance ambulation with RW x4 in therapy gym with CGA.  SPT to/from w/c and mat table with CGA.  Completed unsupported sitting balance and UB light activity.  Returned to room, completed hair washing with sink board - she is able to dry her hair with hair dryer and set up.  She completed standing balance activities with RW CGA.  She remained seated in w/c at close of session,  seat belt alarm set and call bell in reach.    Therapy Documentation Precautions:  Precautions Precautions: Back, Fall, Other (comment)(has had issues w/orthostatic hypotension) Precaution Comments: Pt instructed in back precautions, and to self-monitor for activity tolerance, Required Braces or Orthoses: Spinal Brace Spinal Brace: Thoracolumbosacral orthotic, Applied in supine position Restrictions Weight Bearing Restrictions: No  Therapy/Group: Individual Therapy  Carlos Levering 02/05/2020, 7:41 AM

## 2020-02-05 NOTE — Progress Notes (Signed)
Patient is alert and oriented x3 informed of time per rewuest, assisted to [facility] with 2 staff members( nurse/NT) following directions well,when getting OOB and back to bed. Repositioned and po liquids and pain medication provided, monitored, call bell within reach, SR up. and bed alarm on

## 2020-02-06 ENCOUNTER — Inpatient Hospital Stay (HOSPITAL_COMMUNITY): Payer: PRIVATE HEALTH INSURANCE | Admitting: Occupational Therapy

## 2020-02-06 ENCOUNTER — Inpatient Hospital Stay (HOSPITAL_COMMUNITY): Payer: PRIVATE HEALTH INSURANCE | Admitting: *Deleted

## 2020-02-06 ENCOUNTER — Inpatient Hospital Stay (HOSPITAL_COMMUNITY): Payer: PRIVATE HEALTH INSURANCE

## 2020-02-06 LAB — BASIC METABOLIC PANEL
Anion gap: 8 (ref 5–15)
BUN: 18 mg/dL (ref 8–23)
CO2: 26 mmol/L (ref 22–32)
Calcium: 8.1 mg/dL — ABNORMAL LOW (ref 8.9–10.3)
Chloride: 105 mmol/L (ref 98–111)
Creatinine, Ser: 1.49 mg/dL — ABNORMAL HIGH (ref 0.44–1.00)
GFR calc Af Amer: 43 mL/min — ABNORMAL LOW (ref >60)
GFR calc non Af Amer: 37 mL/min — ABNORMAL LOW (ref >60)
Glucose, Bld: 165 mg/dL — ABNORMAL HIGH (ref 70–99)
Potassium: 3.9 mmol/L (ref 3.5–5.1)
Sodium: 139 mmol/L (ref 135–145)

## 2020-02-06 LAB — GLUCOSE, CAPILLARY
Glucose-Capillary: 104 mg/dL — ABNORMAL HIGH (ref 70–99)
Glucose-Capillary: 114 mg/dL — ABNORMAL HIGH (ref 70–99)
Glucose-Capillary: 109 mg/dL — ABNORMAL HIGH (ref 70–99)
Glucose-Capillary: 203 mg/dL — ABNORMAL HIGH (ref 70–99)

## 2020-02-06 MED ORDER — TRAZODONE HCL 50 MG PO TABS
50.0000 mg | ORAL_TABLET | Freq: Every day | ORAL | Status: DC
  Administered 2020-02-06 – 2020-02-17 (×11): 50 mg via ORAL
  Filled 2020-02-06 (×11): qty 1

## 2020-02-06 NOTE — Progress Notes (Signed)
Occupational Therapy Weekly Progress Note  Patient Details  Name: [name] [name] MRN: [id number] Date of Birth: 06/06/1956  Beginning of progress report period: January 30, 2020 End of progress report period: February 06, 2020  Today's Date: 02/06/2020 OT Individual Time: 0272-5366 OT Individual Time Calculation (min): 73 min    Short term goals not set due to estimated length of stay.  Pt is making steady progress towards goals.  She is currently completing UB bathing and dressing with setup assist at bed level due to need to have TLSO donned when sitting EOB or OOB.  Pt requires total assist for donning TLSO in supine.  Pt is able to complete stand pivot and ambulatory transfers with RW with CGA.  She requires assistance with hygiene post toileting secondary to back precautions and body habitus.  Pt is very motivated to progress towards goals.    Patient continues to demonstrate the following deficits: muscle weakness, decreased cardiorespiratoy endurance, impaired timing and sequencing, unbalanced muscle activation and decreased coordination, decreased visual acuity, decreased safety awareness and decreased standing balance, decreased postural control, decreased balance strategies and difficulty maintaining precautions and therefore will continue to benefit from skilled OT intervention to enhance overall performance with BADL and Reduce care partner burden.  See Patient's Care Plan for progression toward long term goals.  Patient progressing toward long term goals..  Continue plan of care.  Skilled Therapeutic Interventions/Progress Updates:    Treatment session with focus on functional transfers and self-care retraining.  Pt received upright on BSC finishing toileting.  Pt required increased time for toileting reporting having frequent BM.  Pt completed sit > stand with CGA with RW, however required assistance with hygiene due to back precautions and body habitus making hygiene challenging.  Pt  transferred back to bed with RW CGA.  Completed UB bathing and dressing at bed level with setup for items and min cues for technique.  Pt donned underwear and shorts at bed level in figure 4 position with increased time and assist to thread LLE.  Total assist to don TLSO in supine prior to exiting bed.  Pt required mod assist sidelying to sit.  Stand pivot transfer back to w/c with RW CGA.  Pt completed grooming tasks seated at sink and then left upright in w/c with seat belt alarm on and all needs in reach.  Therapy Documentation Precautions:  Precautions Precautions: Back, Fall, Other (comment)(has had issues w/orthostatic hypotension) Precaution Comments: Pt instructed in back precautions, and to self-monitor for activity tolerance, Required Braces or Orthoses: Spinal Brace Spinal Brace: Thoracolumbosacral orthotic, Applied in supine position Restrictions Weight Bearing Restrictions: No Pain: Pain Assessment Pain Scale: 0-10 Pain Score: 6  Pain Type: Acute pain Pain Location: Back Pain Orientation: Lower Pain Descriptors / Indicators: Aching;Discomfort Pain Onset: On-going Patients Stated Pain Goal: 3 Pain Intervention(s): Medication (See eMAR);Repositioned(oxycodone given)   Therapy/Group: Individual Therapy  Simonne Come 02/06/2020, 11:02 AM

## 2020-02-06 NOTE — Progress Notes (Signed)
Physical Therapy Session Note  Patient Details  Name: [name] [name] MRN: [id number] Date of Birth: 09/03/1956  Today's Date: 02/06/2020 PT Individual Time: 4643-1427 PT Individual Time Calculation (min): 43 min   Short Term Goals: Week 1:  PT Short Term Goal 1 (Week 1): STG=LTGs Week 2:  PT Short Term Goal 1 (Week 2): STG = LTG due to ELOS. Week 3:     Skilled Therapeutic Interventions/Progress Updates:    PAIN 5/10 back pain, treatment to tolerance, distraction.   Pt initially OOB in wc, agreeable to rx but states she "had a bad night".  Transported to gym for session.  STS from wc w/cga, gait 22f w/RW and cga, narrow base of support, occasional "wobbles" at knees seemingly random nature w/no balance loss, very slow cadence, requires encouragement and distraction to continue.  STS w/cga, gait 258fto NuStep, turn/sit w/cga.  Deviations as above but no "wobbles". Significant paucity of gait.  Pt requires assist from therapist for set up on Nustep. NuStep x 10 min L4 RPE 5/10 performed for global strength and cardiovascular conditioning. Nustep to wc SPT w/RW w/cga.  Pt transported to room at end of session. Pt left oob in wc w/alarm belt set and needs in reach  Therapy Documentation Precautions:  Precautions Precautions: Back, Fall, Other (comment)(has had issues w/orthostatic hypotension) Precaution Comments: Pt instructed in back precautions, and to self-monitor for activity tolerance, Required Braces or Orthoses: Spinal Brace Spinal Brace: Thoracolumbosacral orthotic, Applied in supine position Restrictions Weight Bearing Restrictions: No    Therapy/Group: Individual Therapy  BaCallie FieldingPTScaggsville/06/2020, 1:35 PM

## 2020-02-06 NOTE — Progress Notes (Signed)
[facility] PHYSICAL MEDICINE & REHABILITATION PROGRESS NOTE   Subjective/Complaints:   Pt reports didn't sleep well due to noises and waking up a lot and then once was asleep, staff woke up this AM- started to dry, so depressed about it.   Also, bed alarm kept going off.   Suggested we try something for sleep. Pt agreeable- was able to stop crying by the time left room.   ROS:   Pt denies SOB, abd pain, CP, N/V/C/D, and vision changes    Objective:   MR BRAIN WO CONTRAST  Result Date: 02/04/2020 CLINICAL DATA:  Delirium, fall with headache EXAM: MRI HEAD WITHOUT CONTRAST TECHNIQUE: Multiplanar, multiecho pulse sequences of the brain and surrounding structures were obtained without intravenous contrast. COMPARISON:  2015 FINDINGS: Brain: There is no acute infarction or intracranial hemorrhage. There is no parenchymal mass. 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 Ventricles are normal in size and configuration. There is no edema, hydrocephalus, or extra-axial fluid collection. Minimal small foci of T2 hyperintensity in the supratentorial white matter are nonspecific but may reflect minor chronic microvascular ischemic changes. Vascular: Major vessel flow voids at the skull base are preserved. Skull and upper cervical spine: Normal marrow signal is preserved. Sinuses/Orbits: Mild ethmoid mucosal thickening. Left lens replacement. Other: Sella is unremarkable. Mild patchy mastoid fluid opacification. Degenerative changes of the temporomandibular joints. IMPRESSION: No acute infarction, hemorrhage, or mass. Stable chronic findings detailed above. Electronically Signed   By: Macy Mis M.D.   On: 02/04/2020 11:08   Recent Labs    02/05/20 0504  WBC 7.7  HGB 8.3*  HCT 28.4*  PLT 176   Recent Labs    02/05/20 0504 02/06/20 0410  NA 142 139  K 4.0 3.9  CL 107 105  CO2 27 26  GLUCOSE 148* 165*  BUN 17 18  CREATININE 1.59* 1.49*  CALCIUM  8.2* 8.1*    Intake/Output Summary (Last 24 hours) at 02/06/2020 1050 Last data filed at 02/06/2020 0810 Gross per 24 hour  Intake 2569.3 ml  Output 1450 ml  Net 1119.3 ml     Physical Exam: Vital Signs Blood pressure (!) 146/72, pulse 80, temperature 98.6 F (37 C), temperature source Oral, resp. rate 18, SpO2 97 %.  Constitutional : sitting up at bedside chair, appropriate, tearful somewhat but resolved, NAD HEENT: conjugate gaze;  Neck: supple Cardiovascular: RRR- no JVD Respiratory/Chest: CTA B/L- no W/R/R- good air movement GI/Abdomen: soft, NT, ND, (+)BS- protuberant Ext: no clubbing, cyanosis, or edema Psych: tearful- cleared up and calmed down by time left room Musculoskeletal:  TTP over posterior head- no bumps/lesions from fall TTP over thoracic and lumbar paraspinals however not on spine itself- no TTP midline except normal over incision area.  Cervical back: Normal range of motion and neck supple.  Comments: RUE- biceps 4/5, triceps 4/5, WE 4/5, grip 4-/5, finger abd 3+/5- all strength rechecked and unchanged from last check LUE- biceps 4+/5, triceps 4+/5, WE 5-/5, grip 4+/5, finger abd 4+/5 (hx of R elbow nerve injury/5 surgeries- with associated scars) RLE- HF 4+/5, KE/KF 5-/5, DF/PF 5-/5 LLE_ HF 4+/5, KE/KF 5-/5, DF/PF 5-/5--- Missing R 5th ray of foot- surgical- scar Neurological: Ox3 again Skin:  Back incision CDI  L heel ulcer- bandaged C/D/I Healed scars on R elbow and R foot        Assessment/Plan: 1. Functional deficits secondary to osteomyelitis of vertebral of thoracolumbar region. Status post L1 corpectomy with pedicle screw fixation  T11, 12, L2 and L3 01/22/2020 which require 3+ hours per day of interdisciplinary therapy in a comprehensive inpatient rehab setting.  Physiatrist is providing close team supervision and 24 hour management of active medical problems listed below.  Physiatrist and rehab team continue to assess barriers to  discharge/monitor patient progress toward functional and medical goals  Care Tool:  Bathing    Body parts bathed by patient: Right arm, Left arm, Chest, Abdomen, Front perineal area, Right upper leg, Left upper leg, Face   Body parts bathed by helper: Buttocks, Left lower leg, Right lower leg     Bathing assist Assist Level: Minimal Assistance - Patient > 75%     Upper Body Dressing/Undressing Upper body dressing   What is the patient wearing?: Pull over shirt Orthosis activity level: Performed by helper  Upper body assist Assist Level: Supervision/Verbal cueing    Lower Body Dressing/Undressing Lower body dressing      What is the patient wearing?: Underwear/pull up, Pants     Lower body assist Assist for lower body dressing: Minimal Assistance - Patient > 75%     Toileting Toileting    Toileting assist Assist for toileting: Moderate Assistance - Patient 50 - 74%     Transfers Chair/bed transfer  Transfers assist     Chair/bed transfer assist level: Contact Guard/Touching assist     Locomotion Ambulation   Ambulation assist      Assist level: Contact Guard/Touching assist Assistive device: Walker-rolling Max distance: 38'   Walk 10 feet activity   Assist     Assist level: Contact Guard/Touching assist Assistive device: Walker-rolling   Walk 50 feet activity   Assist Walk 50 feet with 2 turns activity did not occur: Safety/medical concerns  Assist level: Contact Guard/Touching assist Assistive device: Walker-rolling    Walk 150 feet activity   Assist Walk 150 feet activity did not occur: Safety/medical concerns         Walk 10 feet on uneven surface  activity   Assist     Assist level: Moderate Assistance - Patient - 50 - 74% Assistive device: Aeronautical engineer Will patient use wheelchair at discharge?: No             Wheelchair 50 feet with 2 turns activity    Assist             Wheelchair 150 feet activity     Assist          Blood pressure (!) 146/72, pulse 80, temperature 98.6 F (37 C), temperature source Oral, resp. rate 18, SpO2 97 %.  Medical Problem List and Plan:  1. Decreased functional mobility secondary to osteomyelitis of vertebral of thoracolumbar region. Status post L1 corpectomy with pedicle screw fixation T11, 12, L2 and L3 01/22/2020. TLSO back brace donned and supine.  -patient may Shower- cover incision- might need 2 TLSOs  4/6- had fall last night- has 8/10 headache and paraspinal soreness/pain- will check CT of head  4/8- recovered from fall- HA gone- CT (-) MRI (-) -ELOS/Goals: ~ 8-12 days- goals supervision to min assist  2. Antithrombotics:  -DVT/anticoagulation: bilateral post-tib and peroneal DVT's.    -xarelto initiated yesterday   4/3 -reviewed plan/treatment with patient -antiplatelet therapy: N/A  3. Pain Management: Lyrica 150 mg twice daily, Lidoderm patch, Valium as needed muscle spasms as well as oxycodone as needed   4/5- due to descriptions of spasticity- will try Adding Baclofen 5 mg TID  4/7- Will change Baclofen to 5 mg TID prn- since having increased confusion  4/8- pt much more with it, not sure if time or baclofen prn that helped 4. Mood: Cymbalta 60 mg daily  -antipsychotic agents: N/A  5. Neuropsych: This patient is capable of making decisions on her own behalf.  6. Skin/Wound Care: Routine skin checks  7. Fluids/Electrolytes/Nutrition: Routine in and outs with follow-up chemistries  8. ID. 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 Follow-up per infectious disease  9. Diabetes mellitus. Lantus insulin as directed.    CBG (last 3)  Recent Labs    02/05/20 1639 02/05/20 2015 02/06/20 0604  GLUCAP 143* 276* 104*       4/9- good control overall- con't meds 10. CKD stage III. Follow-up chemistries  4/2- Cr stable at 1.24- recheck Monday  4/5- Cr up to 1.61- could be due to being dry- will  recheck in AM after 1 unit pRBCs  4/6- Cr down to 1.48- has improved- con't to push fluids  4/8- Cr 1.59- over last 2 weeks, running 1.2-1.6- does have CKD Stage III- but could be IV ABX- will give IVFs low dose x 24 hours- 60cc NS  4/9- Cr down to 1.49 today after IVFs- now off IVFs- con't regimen and recheck Monday  11. 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 Wound care left foot as directed. Follow-up per Dr. Sharol Given  12. Acute on chronic anemia. Follow-up CBC   4/1- Hb 7.3- might need transfusion- will monitor   4/4 Recheck CBC 4/5  4/5- CBC Hb 6.7- s/p 1 unit pRBCs- will recheck in AM  4/6- Hb up to 8.1- con't regimen-  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 Patient did have assistance at home. Has "seeing eye dog" and husband  69. Constipation. Continue Senokot. MiraLAX as needed. 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 Fall overnight with new confusion  4/6- getting head CT and monitor- strength unchanged- appears in pain and tremors slightly worse- no other changes  4/7- MRI of brain negative for anything acute- still confused per staff- will make baclofen prn; also could be from IV ABX per pharmacist- will monitor closely.   4/8- cognition is better today- MRI (-)  17. L heel wound  4/9- will consult wound care and get healing shoe to take weight off heel and wound care suggestions.     LOS: 8 days A FACE TO FACE EVALUATION WAS PERFORMED  Eloisa Chokshi 02/06/2020, 10:50 AM

## 2020-02-06 NOTE — Progress Notes (Signed)
Physical Therapy Session Note  Patient Details  Name: [name] [name] MRN: [id number] Date of Birth: 1956-10-10  Today's Date: 02/06/2020 PT Individual Time: 9563-8756 PT Individual Time Calculation (min): 72 min   Short Term Goals: Week 1:  PT Short Term Goal 1 (Week 1): STG=LTGs Week 2:  PT Short Term Goal 1 (Week 2): STG = LTG due to ELOS.  Skilled Therapeutic Interventions/Progress Updates:   Received pt sitting in WC, pt agreeable to therapy, and reported pain 7/10 in back. RN notified and administerd pain medication during session. Repositioning, distractions, and rest breaks given to reduce pain levels. TLSO donned throughout session and therapist assisted with adjusting straps for proper fit. Session focused on functional mobility/transfers, LE strength, dynamic standing balance/coordination, ambulation, and improved activity tolerance. Pt required increased time to complete tasks. Pt transferred to rehab apartment in [facility] total assist for time management and energy conservation purposes. Pt ambulated 38f x 2 trials with RW CGA and performed furniture transfer on recliner and couch with RW min A due to low surfaces. [facility]. Pt ambulated 355fwith RW CGA with increased time due to visual deficits, fatigue, and low back pain. Pt easily distracted telling stories and required increased time to complete tasks throughout session. [facility]. Concluded session with pt sitting in WC, needs within reach, and seatbelt alarm on.   Therapy Documentation Precautions:  Precautions Precautions: Back, Fall, Other (comment)(has had issues w/orthostatic hypotension) Precaution Comments: Pt instructed in back precautions, and to self-monitor for activity tolerance, Required Braces or Orthoses: Spinal Brace Spinal Brace: Thoracolumbosacral orthotic, Applied in supine  position Restrictions Weight Bearing Restrictions: No  Therapy/Group: Individual Therapy AnAlfonse AlpersT, DPT   02/06/2020, 7:29 AM

## 2020-02-06 NOTE — Progress Notes (Addendum)
Social Work Patient ID: [name], adult   DOB: Jul 17, 1956, 64 y.o.   MRN: [id number]    SW met with pt in room to provide updates from team conference, and d/c date 02/13/2020 with recommendation of 24/7 care at this time. Pt reported that she would not have 24/7 care as her friend that has been staying with her at [facility] in the afternoon now has a family obligation. SW discussed with pt SNF-short term rehab. Pt was not in agreement with this as an option. SW to provide a Advertising copywriter for their review. Pt intends to speak with her husband further.   SW left message for pt husband Simona Huh [phone]) on above about d/c date and 24/7 care recommendation. SW encouraged follow-up if needed.   SW provided pt with sitter list. Pt husband in pt room. SW discussed above. Pt/husband would not like placement as an option, and a possible extension here in rehab if possible. States pt will not have 24/7 care if still recommended as not able to afford the cost. SW indicated will follow-up on Tuesday after team conference with updates.   Loralee Pacas, MSW, [place] Office: [phone] Cell: [phone] Fax: [phone]
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
## 2020-02-06 NOTE — Consult Note (Signed)
WOC Nurse Consult Note: Patient receiving care in [facility] [phone].  I spoke with Charge Nurse, Lonn Georgia, by phone about this consult. Reason for Consult: Left heel wound-present for years- can patient put weight on foot to walk?  And, wanting wound care suggestions. Wound type: Chronic, non-healing diabetic foot  Pressure Injury POA: Yes/No/NA Measurement: Wound bed: dry, hard, callused Drainage (amount, consistency, odor) none Periwound: flaking Dressing procedure/placement/frequency: see wound care orders in chart.  Please follow those.  As for walking, the note from the wound center physician indicates the patient has a boot she is supposed to wear to prevent pressure to the area.  This is what she needs to have for walking. Monitor the wound area(s) for worsening of condition such as: Signs/symptoms of infection,  Increase in size,  Development of or worsening of odor, Development of pain, or increased pain at the affected locations.  Notify the medical team if any of these develop.  Thank you for the consult.  Discussed plan of care with the charge nurse.  [place] nurse will not follow at this time.  Please re-consult the [place] team if needed.  Val Riles, RN, MSN, CWOCN, CNS-BC, pager [phone]

## 2020-02-07 ENCOUNTER — Inpatient Hospital Stay (HOSPITAL_COMMUNITY): Payer: PRIVATE HEALTH INSURANCE | Admitting: Occupational Therapy

## 2020-02-07 LAB — GLUCOSE, CAPILLARY
Glucose-Capillary: 159 mg/dL — ABNORMAL HIGH (ref 70–99)
Glucose-Capillary: 186 mg/dL — ABNORMAL HIGH (ref 70–99)
Glucose-Capillary: 139 mg/dL — ABNORMAL HIGH (ref 70–99)
Glucose-Capillary: 158 mg/dL — ABNORMAL HIGH (ref 70–99)

## 2020-02-07 LAB — CBC
HCT: 27.1 % — ABNORMAL LOW (ref 36.0–46.0)
Hemoglobin: 8 g/dL — ABNORMAL LOW (ref 12.0–15.0)
MCH: 25.7 pg — ABNORMAL LOW (ref 26.0–34.0)
MCHC: 29.5 g/dL — ABNORMAL LOW (ref 30.0–36.0)
MCV: 87.1 fL (ref 80.0–100.0)
Platelets: 148 10*3/uL — ABNORMAL LOW (ref 150–400)
RBC: 3.11 MIL/uL — ABNORMAL LOW (ref 3.87–5.11)
RDW: 15.5 % (ref 11.5–15.5)
WBC: 6.4 10*3/uL (ref 4.0–10.5)
nRBC: 0 % (ref 0.0–0.2)

## 2020-02-07 MED ORDER — INSULIN GLARGINE 100 UNIT/ML ~~LOC~~ SOLN
35.0000 [IU] | Freq: Two times a day (BID) | SUBCUTANEOUS | Status: DC
  Administered 2020-02-07 – 2020-02-15 (×16): 35 [IU] via SUBCUTANEOUS
  Filled 2020-02-07 (×17): qty 0.35

## 2020-02-07 NOTE — Plan of Care (Signed)
  Problem: RH SKIN INTEGRITY Goal: RH STG SKIN FREE OF INFECTION/BREAKDOWN Description: Patients skin will remain free from further breakdown or infection with mod I assist. Outcome: Progressing Goal: RH STG MAINTAIN SKIN INTEGRITY WITH ASSISTANCE Description: STG Maintain Skin Integrity With min Assistance. Outcome: Progressing Goal: RH STG ABLE TO PERFORM INCISION/WOUND CARE W/ASSISTANCE Description: STG Able To Perform Incision/Wound Care With min Assistance. Outcome: Progressing   Problem: RH SAFETY Goal: RH STG ADHERE TO SAFETY PRECAUTIONS W/ASSISTANCE/DEVICE Description: STG Adhere to Safety Precautions With min Assistance/Device. Outcome: Progressing Goal: RH STG DECREASED RISK OF FALL WITH ASSISTANCE Description: STG Decreased Risk of Fall With min Assistance. Outcome: Progressing   Problem: RH PAIN MANAGEMENT Goal: RH STG PAIN MANAGED AT OR BELOW PT'S PAIN GOAL Description: Pain < 5 Outcome: Progressing   Problem: RH KNOWLEDGE DEFICIT SCI Goal: RH STG INCREASE KNOWLEDGE OF SELF CARE AFTER SCI Outcome: Progressing

## 2020-02-07 NOTE — Progress Notes (Signed)
Pharmacy Antibiotic Note  [name] [name] is a 64 y.o. 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 S/p lumbar debridement and hardware stabilization on 3/25.  Pharmacy is consulted for cefepime dosing.  Patient remains afebrile, WBC wnl, and renal function has improved some, est nClCr ~44 ml/min.    Of note, patient has had a change in mental status recently. CT head was neg. MRI pending.    Plan - Continue cefepime 2 gm IV q12h, end date 03/05/20 - Watch renal fxn and adjust dose as indicated - OPAT orders pended   Temp (24hrs), Avg:97.7 F (36.5 C), Min:97.5 F (36.4 C), Max:97.9 F (36.6 C)  Recent Labs  Lab 02/02/20 0324 02/03/20 0409 02/05/20 0504 02/06/20 0410 02/07/20 0852  WBC 5.6 6.6 7.7  --  6.4  CREATININE 1.61* 1.48* 1.59* 1.49*  --     CrCl cannot be calculated (Unknown ideal weight.).    Antimicrobials this admission: Levofloxacin 3/25 >> 3/26 Vancomycin 3/25 >> 3/27 Rifampin 3/26 >> 3/27 Cefepime 3/27 >> (5/7)  Microbiology this admission: 3/23 BCx >> neg 3/23 COVID negative 3/24 MRSA PCR negative 3/25 Bone bx > rare Pseudomonas, S to cefepime   [name] Kanady L. [name] [name], [name] [name] PGY1 Pharmacy Resident [phone] 02/07/20      9:50 AM  Please check AMION for all [name] [name] phone numbers After 10:00 PM, call the [name] [name] [phone]

## 2020-02-07 NOTE — Progress Notes (Signed)
Occupational Therapy Session Note  Patient Details  Name: [name] [name] MRN: [id number] Date of Birth: [birth year]  Today's Date: 02/07/2020 OT Individual Time: 1350-1510 OT Individual Time Calculation (min): 80 min    Short Term Goals: Week 3:   see eval  Skilled Therapeutic Interventions/Progress Updates: Patient very talkative and friendly and was encouraged to work as she spoke.    Focus as follows:    UB bathing in w/c at sink= Min A to utilize left hand for grip and to cross midline   LB bathing sit to stand at sink  Min A to maintain left leg over right knee for washing and drying .    UB dressing (personal gown) in bed so that husband could complete education for donning doffing TLSO= moderate assistance LB dressing patnties and tredded socks=moderate assistance due to difficulty holding on with right digits and maintaining balance while pulling up panties in standing at sink position.  Sit to stand several times with cues for technique and contact guard assist Dynamic standing balance for ADLs= Min A until fatigued and then moderate assistance Static standing balance= close S to CGA  W/c to bed transfer=CGA stand pivot Bed positioning= moderate assistance.   Patient with difficulty moving self around in bed  Continue OT Plan of Care  Therapy Documentation Precautions:  Precautions Precautions: Back, Fall, Other (comment)(has had issues w/orthostatic hypotension) Precaution Comments: Pt instructed in back precautions, and to self-monitor for activity tolerance, Required Braces or Orthoses: Spinal Brace Spinal Brace: Thoracolumbosacral orthotic, Applied in supine position Restrictions Weight Bearing Restrictions: No  Pain:denied    Therapy/Group: Individual Therapy  Alfredia Ferguson [facility] 02/07/2020, 4:55 PM

## 2020-02-07 NOTE — Progress Notes (Signed)
ANTICOAGULATION CONSULT NOTE - Follow Up Consult  Pharmacy Consult for Xarelto Indication: DVT  Allergies  Allergen Reactions  . Bee Pollen Anaphylaxis  . Fish-Derived Products Hives, Shortness Of Breath, Swelling and Rash    Hives get in throat causing trouble breathing  . Mushroom Extract Complex Anaphylaxis  . Penicillins Anaphylaxis    **Tolerated cefepime March 2021 Did it involve swelling of the face/tongue/throat, SOB, or low BP? Yes Did it involve sudden or severe rash/hives, skin peeling, or any reaction on the inside of your mouth or nose? No Did you need to seek medical attention at a hospital or doctor's office? Yes When did it last happen?A few months ago If all above answers are "NO", may proceed with cephalosporin use.  [name] Kitchen [name] [name] Anaphylaxis  . Shellfish Allergy Hives, Shortness Of Breath, Swelling and Rash  . Tomato Hives and Shortness Of Breath    Hives in throat causes her trouble breathing  . Acetaminophen Other (See Comments)    GI upset  . Acyclovir And Related Other (See Comments)    Unknown rxn  . Aloe Vera Hives  . Broccoli [Brassica Oleracea] Hives  . Naproxen Other (See Comments)    Unknown rxn    Patient Measurements: Wt: 81.7 kg   Vital Signs: Temp: 97.5 F (36.4 C) (04/10 0426) Temp Source: Oral (04/10 0426) BP: 107/59 (04/10 0426) Pulse Rate: 83 (04/10 0426)  Labs: Recent Labs    02/05/20 0504 02/06/20 0410 02/07/20 0852  HGB 8.3*  --  8.0*  HCT 28.4*  --  27.1*  PLT 176  --  148*  CREATININE 1.59* 1.49*  --     CrCl cannot be calculated (Unknown ideal weight.).   Medical History: Past Medical History:  Diagnosis Date  . Acute MI [facility]) 2007   presented to ED & had cardiac cath- but found to have normal coronaries. Since that point in time her PCP cares f or cardiac needs. Dr. [name] - [facility]  . Anemia   . Anginal pain ([place])   . Anxiety   . Asthma   . Bulging lumbar disc   . Cataract   . 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 Cirrhosis of liver without mention of alcohol   . Constipation   . Dehiscence of closure of skin    left partial calcaneal excision  . Depression   . Diabetes mellitus    insulin dependent, adult onset  . Episode of visual loss of left eye   . Exertional shortness of breath   . Fatty liver   . Fibromyalgia   . GERD (gastroesophageal reflux disease)   . Hepatic steatosis   . High cholesterol   . Hypertension   . MRSA (methicillin resistant Staphylococcus aureus)   . Neuropathy    lower legs  . Osteoarthritis    hands, hips  . Proximal humerus fracture 10/15/12   Left  . PTSD (post-traumatic stress disorder)   . Renal insufficiency 05/05/2015    Medications:  Medications Prior to Admission  Medication Sig Dispense Refill Last Dose  . atorvastatin (LIPITOR) 10 MG tablet Take 1 tablet by mouth once daily 90 tablet 1   . CALCIUM ALGINATE EX Apply 1 patch topically every other day.     . ceFEPime (MAXIPIME) IVPB Inject 2 g into the vein every 12 (twelve) hours. Indication:  Hardware associated vertebral infection Last Day of Therapy:  03/05/2020 Labs -  Once weekly:  CBC/D and BMP, Labs - Every other week:  ESR and CRP 80 Units 0   . DULoxetine (CYMBALTA) 60 MG capsule Take 1 capsule by mouth once daily (Patient taking differently: Take 60 mg by mouth daily. ) 90 capsule 1   . Ensure Max Protein (ENSURE MAX PROTEIN) LIQD Take 330 mLs (11 oz total) by mouth 2 (two) times daily. 3300 mL 0   . EPINEPHrine 0.3 mg/0.3 mL IJ SOAJ injection Inject into the muscle.     . fluticasone (FLONASE) 50 MCG/ACT nasal spray Place 2 sprays into both nostrils daily. (Patient not taking: Reported on 01/20/2020) 16 g 6   . HUMALOG KWIKPEN 200 UNIT/ML SOPN INJECT 13 UNITS SUBCUTANEOUSLY ONCE DAILY (Patient taking differently: Inject 13 Units into the skin daily. ) 6 mL 2   . HYDROcodone-acetaminophen (NORCO/VICODIN) 5-325 MG  tablet Take 1-2 tablets by mouth every 6 (six) hours as needed for moderate pain or severe pain. 120 tablet 0   . Insulin Glargine (LANTUS SOLOSTAR) 100 UNIT/ML Solostar Pen INJECT 20 UNITS SUBCUTANEOUSLY ONCE DAILY IN THE MORNING AND THEN INJECT 40 UNITS AT BEDTIME (Patient taking differently: Inject 20-40 Units into the skin 2 (two) times daily. INJECT 20 UNITS SUBCUTANEOUSLY ONCE DAILY IN THE MORNING AND THEN INJECT 40 UNITS AT BEDTIME) 45 mL 0   . lidocaine (LIDODERM) 5 % Place 1 patch onto the skin daily. Remove & Discard patch within 12 hours or as directed by MD 30 patch 0   . Multiple Vitamins-Minerals (MULTIVITAMIN GUMMIES WOMENS) CHEW Chew 2 each by mouth daily.     . Naftifine HCl 2 % CREA Apply qd for up to 4 weeks (Patient not taking: Reported on 01/20/2020) 60 g 2   . ondansetron (ZOFRAN ODT) 4 MG disintegrating tablet Take 1 tablet (4 mg total) by mouth every 8 (eight) hours as needed for nausea or vomiting. 20 tablet 0   . ondansetron (ZOFRAN ODT) 4 MG disintegrating tablet Take 1 tablet (4 mg total) by mouth every 4 (four) hours as needed for nausea or vomiting. (Patient not taking: Reported on 01/20/2020) 20 tablet 0   . polyethylene glycol (MIRALAX / GLYCOLAX) packet Take 17 g by mouth daily as needed for mild constipation.      . pregabalin (LYRICA) 150 MG capsule Take 1 capsule (150 mg total) by mouth 2 (two) times daily. 180 capsule 1   . promethazine (PHENERGAN) 25 MG suppository Place 1 suppository (25 mg total) rectally every 6 (six) hours as needed for nausea or vomiting. (Patient not taking: Reported on 01/20/2020) 12 each 0     Assessment: 39 YOF with new bilateral DVTs. Pharmacy consulted to start Xarelto. Of note, patient fell recently but CT head yesterday was negative.   SCr improved to 1.48. CrCl ~ 60 ml/min. H&H stable at 8.0/27.1, no issues with bleed reported per RN.   Goal of Therapy:  DVT treatment Monitor platelets by anticoagulation protocol: Yes   Plan:  -  Continue xarelto 15 mg twice daily until 4/23,  then transition to Xarelto 20 mg daily with supper on 4/24 - Monitor CBC, renal fx and s/s of bleeding     [name] [name], [place] PGY1 Pharmacy Resident [phone] 02/07/20      9:49 AM  Please check AMION for all [place] phone numbers After 10:00 PM, call the [place] [phone]

## 2020-02-07 NOTE — Progress Notes (Signed)
[place] PHYSICAL MEDICINE & REHABILITATION PROGRESS NOTE   Subjective/Complaints: Reports some pain in her back. Felt cold last night.  Hgb 8.0 this morning.  Husband visiting today.   ROS:  Pt denies SOB, abd pain, CP, N/V/C/D, and vision changes  Objective:   No results found. Recent Labs    02/05/20 0504 02/07/20 0852  WBC 7.7 6.4  HGB 8.3* 8.0*  HCT 28.4* 27.1*  PLT 176 148*   Recent Labs    02/05/20 0504 02/06/20 0410  NA 142 139  K 4.0 3.9  CL 107 105  CO2 27 26  GLUCOSE 148* 165*  BUN 17 18  CREATININE 1.59* 1.49*  CALCIUM 8.2* 8.1*    Intake/Output Summary (Last 24 hours) at 02/07/2020 1642 Last data filed at 02/07/2020 1300 Gross per 24 hour  Intake 360 ml  Output --  Net 360 ml     Physical Exam: Vital Signs Blood pressure 133/66, pulse 83, temperature 98.8 F (37.1 C), temperature source Oral, resp. rate 17, SpO2 97 %. Constitutional : sitting up at bedside chair, appropriate HEENT: conjugate gaze;  Neck: supple Cardiovascular: RRR- no JVD Respiratory/Chest: CTA B/L- no W/R/R- good air movement GI/Abdomen: soft, NT, ND, (+)BS- protuberant Ext: no clubbing, cyanosis, or edema Psych: tearful- cleared up and calmed down by time left room Musculoskeletal: Brace in place TTP over posterior head- no bumps/lesions from fall TTP over thoracic and lumbar paraspinals however not on spine itself- no TTP midline except normal over incision area.  Cervical back: Normal range of motion and neck supple.  Comments: RUE- biceps 4/5, triceps 4/5, WE 4/5, grip 4-/5, finger abd 3+/5- all strength rechecked and unchanged from last check LUE- biceps 4+/5, triceps 4+/5, WE 5-/5, grip 4+/5, finger abd 4+/5 (hx of R elbow nerve injury/5 surgeries- with associated scars) RLE- HF 4+/5, KE/KF 5-/5, DF/PF 5-/5 LLE_ HF 4+/5, KE/KF 5-/5, DF/PF 5-/5--- Missing R 5th ray of foot- surgical- scar Neurological: Ox3 again Skin:  Back incision CDI  L heel ulcer- bandaged  C/D/I Healed scars on R elbow and R foot      Assessment/Plan: 1. Functional deficits secondary to osteomyelitis of vertebral of thoracolumbar region. Status post L1 corpectomy with pedicle screw fixation T11, 12, L2 and L3 01/22/2020 which require 3+ hours per day of interdisciplinary therapy in a comprehensive inpatient rehab setting.  Physiatrist is providing close team supervision and 24 hour management of active medical problems listed below.  Physiatrist and rehab team continue to assess barriers to discharge/monitor patient progress toward functional and medical goals  Care Tool:  Bathing    Body parts bathed by patient: Right arm, Left arm, Chest, Abdomen, Front perineal area, Right upper leg, Left upper leg, Face   Body parts bathed by helper: Buttocks, Left lower leg, Right lower leg     Bathing assist Assist Level: Minimal Assistance - Patient > 75%     Upper Body Dressing/Undressing Upper body dressing   What is the patient wearing?: Pull over shirt, Orthosis Orthosis activity level: Performed by helper  Upper body assist Assist Level: Supervision/Verbal cueing    Lower Body Dressing/Undressing Lower body dressing      What is the patient wearing?: Underwear/pull up, Pants     Lower body assist Assist for lower body dressing: Minimal Assistance - Patient > 75%     Toileting Toileting    Toileting assist Assist for toileting: Moderate Assistance - Patient 50 - 74%     Transfers Chair/bed transfer  Transfers  assist     Chair/bed transfer assist level: Contact Guard/Touching assist     Locomotion Ambulation   Ambulation assist      Assist level: Contact Guard/Touching assist Assistive device: Walker-rolling Max distance: 55f   Walk 10 feet activity   Assist     Assist level: Contact Guard/Touching assist Assistive device: Walker-rolling   Walk 50 feet activity   Assist Walk 50 feet with 2 turns activity did not occur:  Safety/medical concerns  Assist level: Contact Guard/Touching assist Assistive device: Walker-rolling    Walk 150 feet activity   Assist Walk 150 feet activity did not occur: Safety/medical concerns         Walk 10 feet on uneven surface  activity   Assist     Assist level: Moderate Assistance - Patient - 50 - 74% Assistive device: WAeronautical engineerWill patient use wheelchair at discharge?: No             Wheelchair 50 feet with 2 turns activity    Assist            Wheelchair 150 feet activity     Assist          Blood pressure 133/66, pulse 83, temperature 98.8 F (37.1 C), temperature source Oral, resp. rate 17, SpO2 97 %.  Medical Problem List and Plan:  1. Decreased functional mobility secondary to osteomyelitis of vertebral of thoracolumbar region. Status post L1 corpectomy with pedicle screw fixation T11, 12, L2 and L3 01/22/2020. TLSO back brace donned and supine.  -patient may Shower- cover incision- might need 2 TLSOs  4/6- had fall last night- has 8/10 headache and paraspinal soreness/pain- will check CT of head  4/8- recovered from fall- HA gone- CT (-) MRI (-) -ELOS/Goals: ~ 8-12 days- goals supervision to min assist   Continue CIR PT, OT 2. Antithrombotics:  -DVT/anticoagulation: bilateral post-tib and peroneal DVT's.    -xarelto initiated yesterday   4/3 -reviewed plan/treatment with patient -antiplatelet therapy: N/A  3. Pain Management: Lyrica 150 mg twice daily, Lidoderm patch, Valium as needed muscle spasms as well as oxycodone as needed   4/5- due to descriptions of spasticity- will try Adding Baclofen 5 mg TID  4/7- Will change Baclofen to 5 mg TID prn- since having increased confusion  4/8- pt much more with it, not sure if time or baclofen prn that helped  4/10: well controlled 4. Mood: Cymbalta 60 mg daily  -antipsychotic agents: N/A  5. Neuropsych: This patient is capable of making  decisions on her own behalf.  6. Skin/Wound Care: Routine skin checks  7. Fluids/Electrolytes/Nutrition: Routine in and outs with follow-up chemistries  8. ID. 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 Follow-up per infectious disease  9. Diabetes mellitus. Lantus insulin as directed.    CBG (last 3)  Recent Labs    02/06/20 1955 02/07/20 0608 02/07/20 1149  GLUCAP 203* 159* 186*       4/9- good control overall- con't meds   4/10: elevated. Increased Lantus to 35 BID 10. CKD stage III. Follow-up chemistries  4/2- Cr stable at 1.24- recheck Monday  4/5- Cr up to 1.61- could be due to being dry- will recheck in AM after 1 unit pRBCs  4/6- Cr down to 1.48- has improved- con't to push fluids  4/8- Cr 1.59- over last 2 weeks, running 1.2-1.6- does have CKD Stage III- but could be IV ABX- will give IVFs low dose x 24 hours-  60cc NS  4/9- Cr down to 1.49 today after IVFs- now off IVFs- con't regimen and recheck Monday  11. 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 Wound care left foot as directed. Follow-up per Dr. Sharol Given  12. Acute on chronic anemia. Follow-up CBC   4/1- Hb 7.3- might need transfusion- will monitor   4/4 Recheck CBC 4/5  4/5- CBC Hb 6.7- s/p 1 unit pRBCs- will recheck in AM  4/6- Hb up to 8.1- con't regimen-   4/10: Hgb 8.0 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 Patient did have assistance at home. Has "seeing eye dog" and husband  7. Constipation. Continue Senokot. MiraLAX as needed. 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 Fall overnight with new confusion  4/6- getting  head CT and monitor- strength unchanged- appears in pain and tremors slightly worse- no other changes  4/7- MRI of brain negative for anything acute- still confused per staff- will make baclofen prn; also could be from IV ABX per pharmacist- will monitor closely.   4/8- cognition is better today- MRI (-)  17. L heel wound  4/9- will consult wound care and get healing shoe to take weight off heel and wound care suggestions.     LOS: 9 days A FACE TO FACE EVALUATION WAS PERFORMED  Clide Deutscher Heron Pitcock 02/07/2020, 4:42 PM

## 2020-02-08 ENCOUNTER — Inpatient Hospital Stay (HOSPITAL_COMMUNITY): Payer: PRIVATE HEALTH INSURANCE | Admitting: Physical Therapy

## 2020-02-08 LAB — GLUCOSE, CAPILLARY
Glucose-Capillary: 129 mg/dL — ABNORMAL HIGH (ref 70–99)
Glucose-Capillary: 162 mg/dL — ABNORMAL HIGH (ref 70–99)
Glucose-Capillary: 205 mg/dL — ABNORMAL HIGH (ref 70–99)
Glucose-Capillary: 222 mg/dL — ABNORMAL HIGH (ref 70–99)

## 2020-02-08 MED ORDER — HYDROXYZINE HCL 10 MG PO TABS
10.0000 mg | ORAL_TABLET | Freq: Three times a day (TID) | ORAL | Status: DC | PRN
  Administered 2020-02-08: 22:00:00 10 mg via ORAL
  Filled 2020-02-08 (×3): qty 1

## 2020-02-08 NOTE — Plan of Care (Signed)
  Problem: Consults Goal: RH SPINAL CORD INJURY PATIENT EDUCATION Description:  See Patient Education module for education specifics.  Outcome: Progressing Goal: Nutrition Consult-if indicated Outcome: Progressing Goal: Diabetes Guidelines if Diabetic/Glucose > 140 Description: If diabetic or lab glucose is > 140 mg/dl - Initiate Diabetes/Hyperglycemia Guidelines & Document Interventions  Outcome: Progressing   Problem: RH SKIN INTEGRITY Goal: RH STG SKIN FREE OF INFECTION/BREAKDOWN Description: Patients skin will remain free from further breakdown or infection with mod I assist. Outcome: Progressing Goal: RH STG MAINTAIN SKIN INTEGRITY WITH ASSISTANCE Description: STG Maintain Skin Integrity With min Assistance. Outcome: Progressing Goal: RH STG ABLE TO PERFORM INCISION/WOUND CARE W/ASSISTANCE Description: STG Able To Perform Incision/Wound Care With min Assistance. Outcome: Progressing

## 2020-02-08 NOTE — Plan of Care (Signed)
  Problem: Consults Goal: RH SPINAL CORD INJURY PATIENT EDUCATION Description:  See Patient Education module for education specifics.  Outcome: Progressing Goal: Nutrition Consult-if indicated Outcome: Progressing Goal: Diabetes Guidelines if Diabetic/Glucose > 140 Description: If diabetic or lab glucose is > 140 mg/dl - Initiate Diabetes/Hyperglycemia Guidelines & Document Interventions  Outcome: Progressing   Problem: RH SKIN INTEGRITY Goal: RH STG SKIN FREE OF INFECTION/BREAKDOWN Description: Patients skin will remain free from further breakdown or infection with mod I assist. 02/08/2020 1806 by Adria Devon, LPN Outcome: Progressing 02/08/2020 1357 by Adria Devon, LPN Outcome: Progressing Goal: RH STG MAINTAIN SKIN INTEGRITY WITH ASSISTANCE Description: STG Maintain Skin Integrity With min Assistance. 02/08/2020 1806 by Adria Devon, LPN Outcome: Progressing 02/08/2020 1357 by Adria Devon, LPN Outcome: Progressing Goal: RH STG ABLE TO PERFORM INCISION/WOUND CARE W/ASSISTANCE Description: STG Able To Perform Incision/Wound Care With min Assistance. 02/08/2020 1806 by Adria Devon, LPN Outcome: Progressing 02/08/2020 1357 by Ellison Carwin A, LPN Outcome: Progressing   Problem: RH PAIN MANAGEMENT Goal: RH STG PAIN MANAGED AT OR BELOW PT'S PAIN GOAL Description: Pain < 5 Outcome: Progressing

## 2020-02-08 NOTE — Progress Notes (Signed)
Physical Therapy Session Note  Patient Details  Name: [name] [name] MRN: [id number] Date of Birth: [birth year]  Today's Date: 02/08/2020 PT Individual Time: 2518-9842 PT Individual Time Calculation (min): 49 min   Short Term Goals: Week 2:  PT Short Term Goal 1 (Week 2): STG = LTG due to ELOS.  Skilled Therapeutic Interventions/Progress Updates:   First session:  Pt presents supine in bed and requesting a female for assist in dressing.  Will return when dressed.  Pt then agreeable to participate w/ therapy.  Pt performed rolling side to side w/ supervision to don TLSO.  Pt performed log roll transfer w/ min A and verbal cues.  Re-adjusted TLSO for comfort.  Pt amb in room w/ RW and CGA to w/c.  Pt wheeled to gym for energy and time conservation.  Pt amb multiple sit to stand w/ CGA and cues for hand placement.  Pt amb multiple trials w/ RW and CGA in gym  up to 40'.Pt performed Nu-step x 12' at Level 4 w/o c/o to increase UE/LE strength and endurance.  Pt returned to room and remained sitting in w/c w/ chair alarm on and all needs in place.   Second session:  Pt presents sitting in w/c, agreeable to therapy.  Pt wheeled to gym for energy and time conservation.  Pt performed multiple sit to stand transfers w/ CGA and occasional verbal cues for hand placement.  Pt very distractible and requires re-direction to task.  Pt performed standing UE activities to challenge balance w/ occasional slight buckling of knees, but regains w/ only CGA.  Pt performed 3 x 20 toe taps to 6" platform, alternating w/o LOB.  Pt states pain when weight-bearing on left to lateral thigh.  Pt amb multiple trials w/ RW and CGA up to 45'.  Pt performed including turns to return to seat w/ verbal cues for safe turns.  Pt tends to go too fast.  Pt returned to room and left in w/c w/ chair alarm on and all needs in reach.     Therapy Documentation Precautions:  Precautions Precautions: Back, Fall, Other (comment)(has had  issues w/orthostatic hypotension) Precaution Comments: Pt instructed in back precautions, and to self-monitor for activity tolerance, Required Braces or Orthoses: Spinal Brace Spinal Brace: Thoracolumbosacral orthotic, Applied in supine position Restrictions Weight Bearing Restrictions: No General:   Vital Signs:  Pain:  Pt c/o 8/10 back pain, but has received pain meds prior to therapy.       Therapy/Group: Individual Therapy  Ladoris Gene 02/08/2020, 12:50 PM

## 2020-02-08 NOTE — Progress Notes (Signed)
[facility] PHYSICAL MEDICINE & REHABILITATION PROGRESS NOTE   Subjective/Complaints: Reports some pain in her back as well as in her neck and muscles of her upper back. Also has some itching at incision site. Sitting in wheelchair wearing brace. Say her husband plan to take 6 weeks off to help care for her so she will not have to go to SNF.   ROS:  Pt denies SOB, abd pain, CP, N/V/C/D, and vision changes  Objective:   No results found. Recent Labs    02/07/20 0852  WBC 6.4  HGB 8.0*  HCT 27.1*  PLT 148*   Recent Labs    02/06/20 0410  NA 139  K 3.9  CL 105  CO2 26  GLUCOSE 165*  BUN 18  CREATININE 1.49*  CALCIUM 8.1*    Intake/Output Summary (Last 24 hours) at 02/08/2020 1153 Last data filed at 02/08/2020 0845 Gross per 24 hour  Intake 1231.27 ml  Output --  Net 1231.27 ml     Physical Exam: Vital Signs Blood pressure (!) 110/57, pulse 68, temperature 97.7 F (36.5 C), temperature source Oral, resp. rate 16, SpO2 96 %. Constitutional : CBG being checked at bedside- 162. HEENT: conjugate gaze;  Neck: supple Cardiovascular: RRR- no JVD Respiratory/Chest: CTA B/L- no W/R/R- good air movement GI/Abdomen: soft, NT, ND, (+)BS- protuberant Ext: no clubbing, cyanosis, or edema Psych: tearful- cleared up and calmed down by time left room Musculoskeletal: Brace in place TTP over posterior head- no bumps/lesions from fall TTP over thoracic and lumbar paraspinals however not on spine itself- no TTP midline except normal over incision area.  Cervical back: Normal range of motion and neck supple.  Comments: RUE- biceps 4/5, triceps 4/5, WE 4/5, grip 4-/5, finger abd 3+/5- all strength rechecked and unchanged from last check LUE- biceps 4+/5, triceps 4+/5, WE 5-/5, grip 4+/5, finger abd 4+/5 (hx of R elbow nerve injury/5 surgeries- with associated scars) RLE- HF 4+/5, KE/KF 5-/5, DF/PF 5-/5 LLE_ HF 4+/5, KE/KF 5-/5, DF/PF 5-/5--- Missing R 5th ray of foot- surgical-  scar Neurological: Ox3 again Skin:  Back incision CDI  L heel ulcer- bandaged C/D/I Healed scars on R elbow and R foot      Assessment/Plan: 1. Functional deficits secondary to osteomyelitis of vertebral of thoracolumbar region. Status post L1 corpectomy with pedicle screw fixation T11, 12, L2 and L3 01/22/2020 which require 3+ hours per day of interdisciplinary therapy in a comprehensive inpatient rehab setting.  Physiatrist is providing close team supervision and 24 hour management of active medical problems listed below.  Physiatrist and rehab team continue to assess barriers to discharge/monitor patient progress toward functional and medical goals  Care Tool:  Bathing    Body parts bathed by patient: Right arm, Left arm, Chest, Abdomen, Front perineal area, Right upper leg, Left upper leg, Face   Body parts bathed by helper: Buttocks, Left lower leg, Right lower leg     Bathing assist Assist Level: Minimal Assistance - Patient > 75%     Upper Body Dressing/Undressing Upper body dressing   What is the patient wearing?: Pull over shirt, Orthosis Orthosis activity level: Performed by helper  Upper body assist Assist Level: Supervision/Verbal cueing    Lower Body Dressing/Undressing Lower body dressing      What is the patient wearing?: Underwear/pull up, Pants     Lower body assist Assist for lower body dressing: Minimal Assistance - Patient > 75%     Toileting Toileting    Toileting assist  Assist for toileting: Moderate Assistance - Patient 50 - 74%     Transfers Chair/bed transfer  Transfers assist     Chair/bed transfer assist level: Contact Guard/Touching assist     Locomotion Ambulation   Ambulation assist      Assist level: Contact Guard/Touching assist Assistive device: Walker-rolling Max distance: 54f   Walk 10 feet activity   Assist     Assist level: Contact Guard/Touching assist Assistive device: Walker-rolling   Walk 50  feet activity   Assist Walk 50 feet with 2 turns activity did not occur: Safety/medical concerns  Assist level: Contact Guard/Touching assist Assistive device: Walker-rolling    Walk 150 feet activity   Assist Walk 150 feet activity did not occur: Safety/medical concerns         Walk 10 feet on uneven surface  activity   Assist     Assist level: Moderate Assistance - Patient - 50 - 74% Assistive device: WAeronautical engineerWill patient use wheelchair at discharge?: No             Wheelchair 50 feet with 2 turns activity    Assist            Wheelchair 150 feet activity     Assist          Blood pressure (!) 110/57, pulse 68, temperature 97.7 F (36.5 C), temperature source Oral, resp. rate 16, SpO2 96 %.  Medical Problem List and Plan:  1. Decreased functional mobility secondary to osteomyelitis of vertebral of thoracolumbar region. Status post L1 corpectomy with pedicle screw fixation T11, 12, L2 and L3 01/22/2020. TLSO back brace donned and supine.  -patient may Shower- cover incision- might need 2 TLSOs  4/6- had fall last night- has 8/10 headache and paraspinal soreness/pain- will check CT of head  4/8- recovered from fall- HA gone- CT (-) MRI (-) -ELOS/Goals: ~ 8-12 days- goals supervision to min assist   Continue CIR PT, OT 2. Antithrombotics:  -DVT/anticoagulation: bilateral post-tib and peroneal DVT's.    -xarelto initiated yesterday   4/3 -reviewed plan/treatment with patient -antiplatelet therapy: N/A  3. Pain Management: Lyrica 150 mg twice daily, Lidoderm patch, Valium as needed muscle spasms as well as oxycodone as needed   4/5- due to descriptions of spasticity- will try Adding Baclofen 5 mg TID  4/7- Will change Baclofen to 5 mg TID prn- since having increased confusion  4/8- pt much more with it, not sure if time or baclofen prn that helped  4/10: well controlled  4/11: kpad for muscles of neck and  upper back. 4. Mood: Cymbalta 60 mg daily  -antipsychotic agents: N/A  5. Neuropsych: This patient is capable of making decisions on her own behalf.  6. Skin/Wound Care: Routine skin checks  7. Fluids/Electrolytes/Nutrition: Routine in and outs with follow-up chemistries  8. ID. 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 Follow-up per infectious disease  9. Diabetes mellitus. Lantus insulin as directed.    CBG (last 3)  Recent Labs    02/07/20 1646 02/07/20 2057 02/08/20 0606  GLUCAP 139* 158* 129*       4/9- good control overall- con't meds   4/10: elevated. Increased Lantus to 35 BID   4/11: BG well controlled.  10. CKD stage III. Follow-up chemistries  4/2- Cr stable at 1.24- recheck Monday  4/5- Cr up to 1.61- could be due to being dry- will recheck in AM after 1 unit pRBCs  4/6- Cr  down to 1.48- has improved- con't to push fluids  4/8- Cr 1.59- over last 2 weeks, running 1.2-1.6- does have CKD Stage III- but could be IV ABX- will give IVFs low dose x 24 hours- 60cc NS  4/9- Cr down to 1.49 today after IVFs- now off IVFs- con't regimen and recheck Monday  11. 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 Wound care left foot as directed. Follow-up per Dr. Sharol Given  12. Acute on chronic anemia. Follow-up CBC   4/1- Hb 7.3- might need transfusion- will monitor   4/4 Recheck CBC 4/5  4/5- CBC Hb 6.7- s/p 1 unit pRBCs- will recheck in AM  4/6- Hb up to 8.1- con't regimen-   4/10: Hgb 8.0 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 Patient did have assistance at home. Has "seeing eye dog" and husband  27. Constipation. Continue Senokot. MiraLAX as needed. 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 Fall overnight with new confusion  4/6- getting head CT and monitor- strength unchanged- appears in pain and tremors slightly worse- no other changes  4/7- MRI of brain negative for anything acute- still confused per staff- will make baclofen prn; also could be from IV ABX per pharmacist- will monitor closely.   4/8- cognition is better today- MRI (-)  17. L heel wound, unstageable.   4/9- will consult wound care and get healing shoe to take weight off heel and wound care suggestions.  18. Pruritis: Added hydroxyzine 69m TID prn    LOS: 10 days A FACE TO FACE EVALUATION WAS PERFORMED  KMartha ClanP Eleshia Wooley 02/08/2020, 11:53 AM

## 2020-02-09 ENCOUNTER — Inpatient Hospital Stay (HOSPITAL_COMMUNITY): Payer: PRIVATE HEALTH INSURANCE

## 2020-02-09 ENCOUNTER — Inpatient Hospital Stay (HOSPITAL_COMMUNITY): Payer: PRIVATE HEALTH INSURANCE | Admitting: Occupational Therapy

## 2020-02-09 LAB — GLUCOSE, CAPILLARY
Glucose-Capillary: 156 mg/dL — ABNORMAL HIGH (ref 70–99)
Glucose-Capillary: 216 mg/dL — ABNORMAL HIGH (ref 70–99)
Glucose-Capillary: 212 mg/dL — ABNORMAL HIGH (ref 70–99)
Glucose-Capillary: 320 mg/dL — ABNORMAL HIGH (ref 70–99)

## 2020-02-09 MED ORDER — LOPERAMIDE HCL 2 MG PO CAPS: 2.0000 mg | ORAL_CAPSULE | ORAL | Status: DC | PRN

## 2020-02-09 NOTE — Progress Notes (Signed)
[facility] PHYSICAL MEDICINE & REHABILITATION PROGRESS NOTE   Subjective/Complaints:  Pt reports having "constant diarrhea"- however based on what's documented- had 1 BM in last 24 hours that was mushy and 2 BMs in 24 hrs prior to that.   I ordered Imodium prn, however only if pt having diarrhea, as she put it.  Also insists needs a "hoverround"- explained we will get w/c rep in here to assess appropriate type of w/c for pt.    ROS:   Pt denies SOB, abd pain, CP, N/V/C/D, and vision changes   Objective:   No results found. Recent Labs    02/07/20 0852  WBC 6.4  HGB 8.0*  HCT 27.1*  PLT 148*   No results for input(s): NA, K, CL, CO2, GLUCOSE, BUN, CREATININE, CALCIUM in the last 72 hours.  Intake/Output Summary (Last 24 hours) at 02/09/2020 1939 Last data filed at 02/09/2020 1841 Gross per 24 hour  Intake 1084 ml  Output --  Net 1084 ml     Physical Exam: Vital Signs Blood pressure 130/90, pulse 96, temperature 97.7 F (36.5 C), resp. rate 16, SpO2 95 %. Constitutional : sitting up in bed; wearing TLSO brace, NAD HEENT: conjugate gaze;  Neck: supple Cardiovascular: RRR Respiratory/Chest: CTA B/L- no W/R/R- good air movement GI/Abdomen: Soft, NT, ND, (+)BS  Ext: no clubbing, cyanosis, or edema Psych: trying to direct care, but was not correct on timing Musculoskeletal: Brace on/in place TTP over posterior head- no bumps/lesions from fall TTP over thoracic and lumbar paraspinals however not on spine itself- no TTP midline except normal over incision area.  Cervical back: Normal range of motion and neck supple.  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 Functional deficits secondary to osteomyelitis of vertebral of thoracolumbar region. Status post L1 corpectomy with pedicle screw fixation T11, 12, L2 and L3 01/22/2020 which require 3+ hours per day of interdisciplinary therapy in a comprehensive inpatient rehab setting.  Physiatrist is providing close team supervision and 24 hour management of active medical problems listed below.  Physiatrist and rehab team continue to assess barriers to discharge/monitor patient progress toward functional and medical goals  Care Tool:  Bathing    Body parts bathed by patient: Right arm, Left arm, Chest, Abdomen, Front perineal area, Right upper leg, Left upper leg, Face   Body parts bathed by helper: Buttocks, Left lower leg, Right lower leg     Bathing assist Assist Level: Minimal Assistance - Patient > 75%     Upper Body Dressing/Undressing Upper body dressing   What is the patient wearing?: Pull over shirt, Orthosis Orthosis activity level: Performed by helper  Upper body assist Assist Level: Supervision/Verbal cueing    Lower Body Dressing/Undressing Lower body dressing      What is the patient wearing?: Underwear/pull up, Pants     Lower body assist Assist for lower body dressing: Minimal Assistance - Patient > 75%     Toileting Toileting    Toileting assist Assist for toileting:  Moderate Assistance - Patient 50 - 74%     Transfers Chair/bed transfer  Transfers assist     Chair/bed transfer assist level: Contact Guard/Touching assist     Locomotion Ambulation   Ambulation assist      Assist level: Contact Guard/Touching assist Assistive device: Walker-rolling Max distance: 45'   Walk 10 feet activity   Assist     Assist level: Contact Guard/Touching assist Assistive device: Walker-rolling    Walk 50 feet activity   Assist Walk 50 feet with 2 turns activity did not occur: Safety/medical concerns  Assist level: Contact Guard/Touching assist Assistive device: Walker-rolling    Walk 150 feet activity   Assist Walk 150 feet activity did not occur: Safety/medical concerns         Walk 10 feet on uneven surface  activity   Assist     Assist level: Moderate Assistance - Patient - 50 - 74% Assistive device: Aeronautical engineer Will patient use wheelchair at discharge?: No             Wheelchair 50 feet with 2 turns activity    Assist            Wheelchair 150 feet activity     Assist          Blood pressure 130/90, pulse 96, temperature 97.7 F (36.5 C), resp. rate 16, SpO2 95 %.  Medical Problem List and Plan:  1. Decreased functional mobility secondary to osteomyelitis of vertebral of thoracolumbar region. Status post L1 corpectomy with pedicle screw fixation T11, 12, L2 and L3 01/22/2020. TLSO back brace donned and supine.  -patient may Shower- cover incision- might need 2 TLSOs  4/6- had fall last night- has 8/10 headache and paraspinal soreness/pain- will check CT of head  4/8- recovered from fall- HA gone- CT (-) MRI (-) -ELOS/Goals: ~ 8-12 days- goals supervision to min assist   Continue CIR PT, OT 2. Antithrombotics:  -DVT/anticoagulation: bilateral post-tib and peroneal DVT's.    -xarelto initiated yesterday   4/3 -reviewed plan/treatment with patient -antiplatelet therapy: N/A  3. Pain Management: Lyrica 150 mg twice daily, Lidoderm patch, Valium as needed muscle spasms as well as oxycodone as needed   4/5- due to descriptions of spasticity- will try Adding Baclofen 5 mg TID  4/7- Will change Baclofen to 5 mg TID prn- since having increased confusion  4/8- pt much more with it, not sure if time or baclofen prn that helped  4/10: well controlled  4/11: kpad for muscles of neck and upper back.  4/12-  although pt having spasticity, don't want to sedate her 4. Mood: Cymbalta 60 mg daily  -antipsychotic agents: N/A  5. Neuropsych: This patient is capable of making decisions on her own behalf.  6. Skin/Wound Care: Routine skin checks  7. Fluids/Electrolytes/Nutrition: Routine in and outs with follow-up chemistries  8. ID. 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 Follow-up per infectious disease  9. Diabetes mellitus. Lantus insulin as directed.    CBG (last 3)  Recent Labs    02/09/20 0605 02/09/20 1127 02/09/20 1629  GLUCAP 156* 216* 212*       4/9- good control overall- con't meds   4/10: elevated. Increased Lantus to 35 BID   4/11: BG well controlled.  4/12- BGs elevated- pt has snacks at bedside? Will wait to change Lantus  10. CKD stage III. Follow-up chemistries  4/2- Cr stable at 1.24- recheck Monday  4/5- Cr up to  1.61- could be due to being dry- will recheck in AM after 1 unit pRBCs  4/6- Cr down to 1.48- has improved- con't to push fluids  4/8- Cr 1.59- over last 2 weeks, running 1.2-1.6- does have CKD Stage III- but could be IV ABX- will give IVFs low dose x 24 hours- 60cc NS  4/9- Cr down to 1.49 today after IVFs- now off IVFs- con't regimen and recheck Monday  11. 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 Wound care left foot as directed. Follow-up per Dr. Sharol Given  12. Acute on chronic anemia. Follow-up CBC   4/1- Hb 7.3- might need transfusion- will monitor   4/4 Recheck CBC 4/5  4/5- CBC Hb 6.7- s/p 1 unit pRBCs- will recheck in AM  4/6- Hb up to 8.1- con't regimen-   4/10: Hgb 8.0 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 Patient did have assistance at home. Has "seeing eye dog" and husband  27. Constipation. Continue Senokot. MiraLAX as needed. No nausea vomiting   4/2- 4 BMs yesterday-will stop Colace BID and con't senokot BID  4/4 seems to be moving bowels daily now  4/5- 4 BMs  yesterday- will reduce Senokot to 1 tab/day  4/8- says had 5 BM overnight- had 1 documented- will have her refuse senokot if she wants to, and monitor-  4/12- Pt insisting having man BMs/diarrhea- not documented- will talk with her.   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 Fall overnight with new confusion  4/6- getting head CT and monitor- strength unchanged- appears in pain and tremors slightly worse- no other changes  4/7- MRI of brain negative for anything acute- still confused per staff- will make baclofen prn; also could be from IV ABX per pharmacist- will monitor closely.   4/8- cognition is better today- MRI (-)  17. L heel wound, unstageable.   4/9- will consult wound care and get healing shoe to take weight off heel and wound care suggestions.  18. Pruritis: Added hydroxyzine 69m TID prn    LOS: 11 days A FACE TO FACE EVALUATION WAS PERFORMED  [place] 02/09/2020, 7:39 PM

## 2020-02-09 NOTE — Progress Notes (Signed)
Slept good. At HS Cleaned bottom of left foot/heel with soap and water, new kerlix applied. Pin point black spot to tip of left great toe. Left flank with honeycomb dressing in place. Lower back with steris. C/O itching all over, no rash observed, PRN atarax given at 2131. Pain managed with PRN Oxy IR. Patrici Ranks A

## 2020-02-09 NOTE — Plan of Care (Signed)

## 2020-02-09 NOTE — Progress Notes (Addendum)
Physical Therapy Session Note  Patient Details  Name: [name] [name] MRN: [id number] Date of Birth: 1955-11-25  Today's Date: 02/09/2020 PT Individual Time: 6213-0865 and 7846 -9629 PT Individual Time Calculation (min): 75 min and 46  Short Term Goals: Week 1:  PT Short Term Goal 1 (Week 1): STG=LTGs Week 2:  PT Short Term Goal 1 (Week 2): STG = LTG due to ELOS. Week 3:     Skilled Therapeutic Interventions/Progress Updates:    PAIN 9/10 low back pain, nursing provided pain meds, treatment to tolerance. Pt hyperverbal entire session, skips from one topic directly to next.  Pt initially seated in wc in nightshirt and TLSO finishing breakfast.  SPT wc to edge of bed w/cga and cues/transferred  for dressing. Therapist assisted w/removing TLSO, pt removes nightshirt w/min assist, dons clean shirt w/set up assist. Therapist assists w/threading pants due to spinal precs.  Brace reapplied and STS w/cga to RW, therapist assists raising pants/mod assist.  Stand to sit w/cga.  Brace removed per pt request to adjust pants/further raise in sitting performed w/cga for balance.  Brace reapplied by therapist. SPT to wc w/cga w/RW.  Transported to gym for continued session.  Pt expressed need for replacement of current scooter w/more accessibile mode of transport.  Pt requested MD arrange, MD requested  Therapist further explore options w/vendor.  Contacted/ discussed mtg w/vendor to discuss options w/pt and family.  This was then set up by therapist for next date w/vendor/pt/husband .  Pt also agreed to discuss family training availability .  Will discuss w/scheduler.  Pt STS w/cga.  Gait 51f w/cga and cues for increased step length/clearance.  Wobbles at knees w/random bilat "buckling" w/full recovery by pt and no balance loss or true instability noted. Cadence is exceptionally slow.  Stairs: STS w/cga.   Pt ascends/descends 4 stairs w/2 rails w/min assist but very slowly w/maximal encouragement and  verbal assurance required, additional time.   Parallel bars: Sidestepping length of bars x 2 w/bilat UE support, cga STS x 5 w/cga Pt transported to room at end of session. Pt left oob in wc w/alarm belt set and needs in reach    Session 2:  Pt continues to be extremetly hyperverbal throughout session. PAIN 8/10 bilat feet due to fibromyalgia per pt.  Pt initially OOB in wc and agreeable to treatment session with focus on gait endurance and LE strengfth.    Pt transported to gym for session.  STS w/cga.  Gait 42fx 2 w/cga, improved cadence and clearance vs am session.  Rest break provided x 4 min between efforts.  Repeated STS from wc x 1 min for total of 10 reps.w/cga per formed for strengthening.  Balance:  Static stand w/ walker w/cga to close supervision.  Standing w/single UE support initially  Min assist and mild LE instability, able to stabilize w/verbal cues and perfoms then w/cga only.  Progressed to stantic stand no UE support again w/increased LE "wobbles" but improves w/cues/time to cga for up to 10 sec.  Random increased AP sway.  Discussed concerns re: ability to safely steer PWC w/visual deficits.  Pt stated she uses scooter and is able to drive it w/success w/current vision which seems inconsistent w/previous statement that husband did not want her to use.  Also stated that her husband was able to locate a "programmable" wc for visual impairments.  Will need to further discuss w/vendor.  wc propulsion w/bilat UEs x 2524fsing bilat UEs w/max encouragement, propels exceptionaly  slowly stating it was vey difficult due to UE weakness.  Pt left oob in wc w/alarm belt set and needs in reach   Therapy Documentation Precautions:  Precautions Precautions: Back, Fall, Other (comment)(has had issues w/orthostatic hypotension) Precaution Comments: Pt instructed in back precautions, and to self-monitor for activity tolerance, Required Braces or Orthoses: Spinal  Brace Spinal Brace: Thoracolumbosacral orthotic, Applied in supine position Restrictions Weight Bearing Restrictions: No    Therapy/Group: Individual Therapy  [name], [place] 02/09/2020, 12:27 PM

## 2020-02-09 NOTE — Progress Notes (Signed)
Social Work [name], adult   DOB: 27-May-1956, 64 y.o.   MRN: [id number]   SW received a message from [place] (p:[phone]/f:[phone]) Josem Kaufmann #[phone]  approving for an additional 7 days. Covered days 4/1-4/14 with next update due on 02/12/2020.   Loralee Pacas, MSW, [place] Office: [phone] Cell: [phone] Fax: [phone]

## 2020-02-09 NOTE — Progress Notes (Signed)
Occupational Therapy Session Note  Patient Details  Name: Tanajah Boulter MRN: [id number] Date of Birth: Oct 08, 1956  Today's Date: 02/09/2020 OT Individual Time: 1000-1115 OT Individual Time Calculation (min): 75 min    Short Term Goals: Week 2:  OT Short Term Goal 1 (Week 2): STG = LTGs due to remaining LOS  Skilled Therapeutic Interventions/Progress Updates:    Patient seated in w/c, denies pain but states that legs are tired from PT this am.  Reviewed basic reach and transport of items in kitchen environment using RW.  She requires CGA and cues for location of items due to limited vision.  She is unable to propel w.c due to limited reach/ROM in UEs.  Reviewed strategies for basic HM due to low vision and current balance/mobility impairment.  Recommend seated activities and no use of sharps at this time.  Family education session scheduled for tomorrow afternoon at 2:30 Completed short distance ambulation with CGA.  Completed unsupported sitting activities with focus on balance and UB conditioning, standing balance and LB conditioning.  Provided and reviewed theraputty HEP.  She remained seated in w/c at close of session.  Seat belt alarm set and call bell in reach.    Therapy Documentation Precautions:  Precautions Precautions: Back, Fall, Other (comment)(has had issues w/orthostatic hypotension) Precaution Comments: Pt instructed in back precautions, and to self-monitor for activity tolerance, Required Braces or Orthoses: Spinal Brace Spinal Brace: Thoracolumbosacral orthotic, Applied in supine position Restrictions Weight Bearing Restrictions: No General:   Vital Signs: Therapy Vitals Temp: 97.9 F (36.6 C) Pulse Rate: 88 Resp: 20 BP: 137/67 Patient Position (if appropriate): Lying Oxygen Therapy SpO2: 95 % O2 Device: Room Air   Therapy/Group: Individual Therapy  Carlos Levering 02/09/2020, 7:39 AM

## 2020-02-10 ENCOUNTER — Inpatient Hospital Stay (HOSPITAL_COMMUNITY): Payer: PRIVATE HEALTH INSURANCE | Admitting: Occupational Therapy

## 2020-02-10 ENCOUNTER — Inpatient Hospital Stay (HOSPITAL_COMMUNITY): Payer: PRIVATE HEALTH INSURANCE

## 2020-02-10 LAB — GLUCOSE, CAPILLARY
Glucose-Capillary: 141 mg/dL — ABNORMAL HIGH (ref 70–99)
Glucose-Capillary: 168 mg/dL — ABNORMAL HIGH (ref 70–99)
Glucose-Capillary: 179 mg/dL — ABNORMAL HIGH (ref 70–99)
Glucose-Capillary: 232 mg/dL — ABNORMAL HIGH (ref 70–99)
Glucose-Capillary: 382 mg/dL — ABNORMAL HIGH (ref 70–99)

## 2020-02-10 LAB — BASIC METABOLIC PANEL
Anion gap: 7 (ref 5–15)
BUN: 27 mg/dL — ABNORMAL HIGH (ref 8–23)
CO2: 24 mmol/L (ref 22–32)
Calcium: 8.7 mg/dL — ABNORMAL LOW (ref 8.9–10.3)
Chloride: 105 mmol/L (ref 98–111)
Creatinine, Ser: 1.93 mg/dL — ABNORMAL HIGH (ref 0.44–1.00)
GFR calc Af Amer: 31 mL/min — ABNORMAL LOW (ref >60)
GFR calc non Af Amer: 27 mL/min — ABNORMAL LOW (ref >60)
Glucose, Bld: 164 mg/dL — ABNORMAL HIGH (ref 70–99)
Potassium: 4.6 mmol/L (ref 3.5–5.1)
Sodium: 136 mmol/L (ref 135–145)

## 2020-02-10 LAB — SEDIMENTATION RATE: Sed Rate: >140 mm/hr — ABNORMAL HIGH (ref 0–22)

## 2020-02-10 LAB — CBC WITH DIFFERENTIAL/PLATELET
Abs Immature Granulocytes: 0.02 10*3/uL (ref 0.00–0.07)
Basophils Absolute: 0.1 10*3/uL (ref 0.0–0.1)
Basophils Relative: 1 %
Eosinophils Absolute: 0.2 10*3/uL (ref 0.0–0.5)
Eosinophils Relative: 3 %
HCT: 23.3 % — ABNORMAL LOW (ref 36.0–46.0)
Hemoglobin: 6.8 g/dL — CL (ref 12.0–15.0)
Immature Granulocytes: 0 %
Lymphocytes Relative: 17 %
Lymphs Abs: 1.1 10*3/uL (ref 0.7–4.0)
MCH: 25.5 pg — ABNORMAL LOW (ref 26.0–34.0)
MCHC: 29.2 g/dL — ABNORMAL LOW (ref 30.0–36.0)
MCV: 87.3 fL (ref 80.0–100.0)
Monocytes Absolute: 0.7 10*3/uL (ref 0.1–1.0)
Monocytes Relative: 11 %
Neutro Abs: 4.3 10*3/uL (ref 1.7–7.7)
Neutrophils Relative %: 68 %
Platelets: 109 10*3/uL — ABNORMAL LOW (ref 150–400)
RBC: 2.67 MIL/uL — ABNORMAL LOW (ref 3.87–5.11)
RDW: 16 % — ABNORMAL HIGH (ref 11.5–15.5)
WBC: 6.3 10*3/uL (ref 4.0–10.5)
nRBC: 0 % (ref 0.0–0.2)

## 2020-02-10 LAB — PREPARE RBC (CROSSMATCH)

## 2020-02-10 LAB — C-REACTIVE PROTEIN: CRP: 8.9 mg/dL — ABNORMAL HIGH (ref <1.0)

## 2020-02-10 MED ORDER — CEFEPIME IV (FOR PTA / DISCHARGE USE ONLY): 2.0000 g | Freq: Two times a day (BID) | INTRAVENOUS | 0 refills | Status: DC

## 2020-02-10 MED ORDER — SODIUM CHLORIDE 0.9 % NICU IV INFUSION SIMPLE
INJECTION | INTRAVENOUS | Status: AC
  Administered 2020-02-10: 17:00:00 60 mL/h via INTRAVENOUS
  Filled 2020-02-10 (×3): qty 500

## 2020-02-10 MED ORDER — PSYLLIUM 95 % PO PACK
1.0000 | PACK | Freq: Every day | ORAL | Status: DC
  Administered 2020-02-10 – 2020-02-17 (×7): 1 via ORAL
  Filled 2020-02-10 (×10): qty 1

## 2020-02-10 MED ORDER — SODIUM CHLORIDE 0.9% IV SOLUTION: Freq: Once | INTRAVENOUS | Status: AC

## 2020-02-10 NOTE — Progress Notes (Signed)
Occupational Therapy Session Note  Patient Details  Name: [name] [name] MRN: [id number] Date of Birth: January 31, 1956  Today's Date: 02/10/2020 OT Individual Time: 1040-1130  &  1430-1545 OT Individual Time Calculation (min): 50 min & 75 min   Short Term Goals: Week 1:  OT Short Term Goal 1 (Week 1): STG=LTG d/t ELOS OT Short Term Goal 1 - Progress (Week 1): Progressing toward goal  Skilled Therapeutic Interventions/Progress Updates:    AM session:  patient seated in w/c, alert and ready for therapy.  Notes pain "9" in back.  She states that she is tired and shaky and going to have a transfusion later today.  Short distance ambulation with RW CGA w/c to bed.  Sit to side lying to supine with CS.  Max a to remove brace in supine.  Min a to remove night gown.  Able to wash UB and LB with set up excluding buttocks in side lying position.  LB dressing with min A.  UB dressing:   Min A Footwear:  Max A/dep Dependent to place and adjust brace Patient states that pain improved with position change.  Supine to side lying with CS, side lying to sitting min A.  SPT to w.c with RW CGA.  She remained seated in w/c at close of session with seat belt alarm set and call bell in reach.    PM session:  Patient in bed, alert - currently getting blood transfusion.  Husband present for therapy session.  Patient reports mild back pain - relieved with taking off brace as she must remain in bed at this time.  Corene Cornea from [facility] arrived and reviewed request for power mobility with patient and husband.  They discussed insurance justification needs and potential options in the future should patient's mobility impairment worsen from current status.  Due to patient's current ability to walk in therapy environment and ongoing progress, all agreed at this time power mobility is not indicated.   Due to patient having to stay in bed at this time, we verbally reviewed positioning and patient's current functional level of  assistance for toileting, bathing, dressing and grooming tasks.  Reviewed brace doffing and donning and importance of completing in a supine position and having it on at all times OOB.  Reviewed recommendation for 24 hour supervision due to need for min A ADL, CGA transfers and ambulation and dependent brace application.  Reviewed DME:  They have RW, rollator, shower bench, commode, scooter.  Discussed need for manual w/c due to occ limited mobility (added to request for DME with SW).  Reviewed home activity options.   Offered additional family education sessions but husband states that he will not be able to get off additional days of work and states that he has seen her OOB in prior sessions and does not have additional questions at this time.  Patient remained in bed, exited with all current needs met and call bell in hand.    Therapy Documentation Precautions:  Precautions Precautions: Back, Fall, Other (comment)(has had issues w/orthostatic hypotension) Precaution Comments: Pt instructed in back precautions, and to self-monitor for activity tolerance, Required Braces or Orthoses: Spinal Brace Spinal Brace: Thoracolumbosacral orthotic, Applied in supine position Restrictions Weight Bearing Restrictions: No General:   Vital Signs: Therapy Vitals Temp: 98.7 F (37.1 C) Temp Source: Oral Pulse Rate: 82 Resp: 16 BP: (!) 132/56 Patient Position (if appropriate): Lying Oxygen Therapy SpO2: 93 % O2 Device: Room Air   Therapy/Group: Individual Therapy  Bonnita Levan  Sindi Beckworth 02/10/2020, 7:50 AM

## 2020-02-10 NOTE — Patient Care Conference (Signed)
Inpatient RehabilitationTeam Conference and Plan of Care Update Date: 02/10/2020   Time: 11:50 AM    Patient Name: [name] [name]      Medical Record Number: [id number]  Date of Birth: 1956/09/03 Sex: Adult         Room/Bed: 4W17C/4W17C-01 Payor Info: Payor: GENERIC COMMERCIAL / Plan: GENERIC COMMERCIAL / Product Type: *No Product type* /    Admit Date/Time:  01/29/2020  4:50 PM  Primary Diagnosis:  Osteomyelitis ([place])  Patient Active Problem List   Diagnosis Date Noted  . Osteomyelitis ([place]) 01/29/2020  . Spinal stenosis of lumbar region 01/22/2020  . Osteomyelitis of vertebra of thoracolumbar region ([place]) 01/21/2020  . Paraspinal mass 01/20/2020  . Back pain 11/17/2019  . Compression fracture of L1 lumbar vertebra ([place]) 11/17/2019  . Fracture of multiple ribs 11/17/2019  . Intractable nausea and vomiting 11/16/2019  . ARF (acute renal failure) ([place]) 11/15/2019  . Nausea & vomiting 11/15/2019  . Pressure injury of skin 04/17/2019  . Septic arthritis of elbow, right ([place]) 04/17/2019  . 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 Sleep apnea 11/16/2017  . Diabetic foot infection ([place]) 04/06/2017  . Type 2 diabetes mellitus with hyperglycemia, with long-term current use of insulin ([place]) 04/06/2017  . Ulcer of left heel, limited to breakdown of skin ([place]) 02/01/2017  . Neuropathy 05/05/2015  . Diabetic peripheral neuropathy ([place]) 01/12/2015  . CKD (chronic kidney disease), stage III ([place]) 05/27/2014  . History of MI (myocardial infarction) 10/06/2013  . Nausea and vomiting 10/06/2013  . Obesity (BMI 30-39.9) 09/20/2013  . 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 Normocytic anemia 02/03/2013  . CAD (coronary artery disease) 01/11/2012  . Hepatic cirrhosis ([place]) 07/06/2010  . 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  . Hyperlipidemia 06/03/2008  . Essential hypertension 12/23/2006    Expected Discharge Date: Expected Discharge Date: 02/13/20  Team Members  Present: Physician leading conference: Dr. Courtney Heys Care Coodinator Present: Loralee Pacas, LCSWA;Christina Sampson Goon, BSW;Genie Jais Demir, RN, MSN Nurse Present: Isla Pence, RN PT Present: Michaelene Song, PT OT Present: Elisabeth Most, OT SLP Present: Weston Anna, SLP PPS Coordinator present : Gunnar Fusi, SLP     Current Status/Progress Goal Weekly Team Focus  Bowel/Bladder   Patient is continent of bowel/bladder  Pt will continue to be continent of bowel/bladder until discharge   Toileting every 2 hours or as needed    Swallow/Nutrition/ Hydration             ADL's   UB bathing/dressing set up/CS, LB bathing/dressing min A, max A footwear, dependent TLSO, CS functional transfers and short distance ambulation with cues for direction  CS  adl training, transfer training, family education   Mobility   supervision bed mobility, CGA-min assist transfers and gait up to 50 ft with RW, mod assist steps  Mod I bed mobility, transfers, and household gait (determine if safe due to visual deficits), supervision community ambulation, CGA 1+1 stairs and 4 steps B rails  balance, strength, activity tolerance, endurance, functional mobility, stairs, spinal precautions, education and d/c planning   Communication             Safety/Cognition/ Behavioral Observations            Pain   Pt c/o of 7 out of 10 back pain  Pt will maintain pain level of 3 or less  Continue to assess pain q shift or as needed   Skin   Honeycomb dressing with minimal drainage, surgical incision to lower back  Keep skin breakdown from occuring   Assess skin q shift     Rehab Goals Patient on target to meet rehab goals: Yes *See Care Plan and progress notes for long and short-term goals.     Barriers to Discharge  Current Status/Progress Possible Resolutions Date Resolved   Nursing                  PT                    OT                  SLP                SW Decreased caregiver support;Lack  of/limited family support              Discharge Planning/Teaching Needs:  D/c to home with support from husband (intermittent since he works); pt has paid caregiver 9am-12pm; husband comes home for lunch 12pm-1pm; friend who was checking on pt in the afternoon now has family obligation and unable to assist. Pt husband would like to know if pt requires more time here in rehab as he is unable to afford additional sitter services.  Family education as recommended by therapy.   Team Discussion: MD diarrhea, add metamucil, Hgb 6.8, transfusion today, push fluids, on IV fluids, monitoring labs, wound bottom of feet.  RN BM 4/12, tired, sleeps cont B/B.  OT supine bathing min A LB, max A brace, min A bed, CGA transfers, min A toileting, vision deficits.  PT CGA/min A transfers, amb 15', needs 24/7 S.  Has sitter 9-12, husband 12-1, alone until 2 pm, husband works.   Revisions to Treatment Plan: N/A     Medical Summary Current Status: very tired- not drinking a lot of fluids- LBM yesterday; heel the same; Weekly Focus/Goal: OT_ min assist LB and bottom bath/dress; max assist for brace; min assist bed  Barriers to Discharge: Behavior;Other (comments);Home enviroment access/layout;Incontinence;Decreased family/caregiver support;Medical stability;Weight  Barriers to Discharge Comments: legal blindness; blood transfusion; not safe to be along during the day- 24 hr sup. Possible Resolutions to Barriers: PT- CGA-min assist- more fatigued; shaky; walked up to 124f RW in past; power w/c not appropriate per therapy; needs RW   Continued Need for Acute Rehabilitation Level of Care: The patient requires daily medical management by a physician with specialized training in physical medicine and rehabilitation for the following reasons: Direction of a multidisciplinary physical rehabilitation program to maximize functional independence : Yes Medical management of patient stability for increased activity during  participation in an intensive rehabilitation regime.: Yes Analysis of laboratory values and/or radiology reports with any subsequent need for medication adjustment and/or medical intervention. : Yes   I attest that I was present, lead the team conference, and concur with the assessment and plan of the team.   LRetta Diones4/13/2021, 6:27 PM   Team conference was held via web/ teleconference due to [place]- [place]

## 2020-02-10 NOTE — Plan of Care (Signed)

## 2020-02-10 NOTE — Progress Notes (Signed)
Social Work [name], adult   DOB: October 22, 1956, 64 y.o.   MRN: [id number]    SW met with pt in room to provide udpaes from team confernce informing pt that she remains on target for dischage. SW discussed with pt her plans for assitance at home. Pt states that her husband has mentioned possibly taking time off.   SW left message for pt husband [name] [name] to inform on d/c date remaining, and requested return call to discuss discharge plan.   Loralee Pacas, MSW, [place] Office: [phone] Cell: [phone] Fax: [phone]

## 2020-02-10 NOTE — Progress Notes (Signed)
[place] PHYSICAL MEDICINE & REHABILITATION PROGRESS NOTE   Subjective/Complaints:  Pt admits not having diarrhea as she puts it, more than 1x/day, but said at home went every other day- interested in metamucil to bulk up stools.   Concern that Hb is dropping- will check Hemoccult.  Also Willing to get blood for Hb of 6.8. don't think dilutional- due to Cr 1.93.   Plts 109k- dropping was 180k.      ROS:   Pt denies SOB, abd pain, CP, N/V/C/D, and vision changes    Objective:   No results found. Recent Labs    02/10/20 0400  WBC 6.3  HGB 6.8*  HCT 23.3*  PLT 109*   Recent Labs    02/10/20 0400  NA 136  K 4.6  CL 105  CO2 24  GLUCOSE 164*  BUN 27*  CREATININE 1.93*  CALCIUM 8.7*    Intake/Output Summary (Last 24 hours) at 02/10/2020 1020 Last data filed at 02/10/2020 0840 Gross per 24 hour  Intake 642 ml  Output --  Net 642 ml     Physical Exam: Vital Signs Blood pressure (!) 132/56, pulse 82, temperature 98.7 F (37.1 C), temperature source Oral, resp. rate 16, SpO2 93 %. Constitutional : sitting up in bed; appropriate, OT in room, NAD HEENT: conjugate gaze; very pale conjunctiva Neck: supple Cardiovascular: RRR Respiratory/Chest: CTA b/l GI/Abdomen:soft, NT, ND; (+)BS Ext: no clubbing, cyanosis, or edema Psych: trying to direct care, but was not correct on timing Musculoskeletal: Brace on/in place TTP over posterior head- no bumps/lesions from fall TTP over thoracic and lumbar paraspinals however not on spine itself- no TTP midline except normal over incision area.  Cervical back: Normal range of motion and neck supple.  Comments: RUE- biceps 4/5, triceps 4/5, WE 4/5, grip 4-/5, finger abd 3+/5- all strength rechecked and unchanged from last check LUE- biceps 4+/5, triceps 4+/5, WE 5-/5, grip 4+/5, finger abd 4+/5 (hx of R elbow nerve injury/5 surgeries- with associated scars) RLE- HF 4+/5, KE/KF 5-/5, DF/PF 5-/5 LLE_ HF 4+/5, KE/KF 5-/5,  DF/PF 5-/5--- Missing R 5th ray of foot- surgical- scar Neurological: Ox3 Skin:  Back incision CDI  L heel ulcer- bandaged C/D/I Healed scars on R elbow and R foot Psychiatric- appropriate, calm but has UEs tremors which is new      Assessment/Plan: 1. Functional deficits secondary to osteomyelitis of vertebral of thoracolumbar region. Status post L1 corpectomy with pedicle screw fixation T11, 12, L2 and L3 01/22/2020 which require 3+ hours per day of interdisciplinary therapy in a comprehensive inpatient rehab setting.  Physiatrist is providing close team supervision and 24 hour management of active medical problems listed below.  Physiatrist and rehab team continue to assess barriers to discharge/monitor patient progress toward functional and medical goals  Care Tool:  Bathing    Body parts bathed by patient: Right arm, Left arm, Chest, Abdomen, Front perineal area, Right upper leg, Left upper leg, Face   Body parts bathed by helper: Buttocks, Left lower leg, Right lower leg     Bathing assist Assist Level: Minimal Assistance - Patient > 75%     Upper Body Dressing/Undressing Upper body dressing   What is the patient wearing?: Pull over shirt, Orthosis Orthosis activity level: Performed by helper  Upper body assist Assist Level: Supervision/Verbal cueing    Lower Body Dressing/Undressing Lower body dressing      What is the patient wearing?: Underwear/pull up, Pants     Lower body assist Assist for  lower body dressing: Minimal Assistance - Patient > 75%     Toileting Toileting    Toileting assist Assist for toileting: Moderate Assistance - Patient 50 - 74%     Transfers Chair/bed transfer  Transfers assist     Chair/bed transfer assist level: Minimal Assistance - Patient > 75%     Locomotion Ambulation   Ambulation assist      Assist level: Minimal Assistance - Patient > 75% Assistive device: Walker-rolling Max distance: 15 ft   Walk 10 feet  activity   Assist     Assist level: Minimal Assistance - Patient > 75% Assistive device: Walker-rolling   Walk 50 feet activity   Assist Walk 50 feet with 2 turns activity did not occur: Safety/medical concerns  Assist level: Contact Guard/Touching assist Assistive device: Walker-rolling    Walk 150 feet activity   Assist Walk 150 feet activity did not occur: Safety/medical concerns         Walk 10 feet on uneven surface  activity   Assist     Assist level: Moderate Assistance - Patient - 50 - 74% Assistive device: Aeronautical engineer Will patient use wheelchair at discharge?: No             Wheelchair 50 feet with 2 turns activity    Assist            Wheelchair 150 feet activity     Assist          Blood pressure (!) 132/56, pulse 82, temperature 98.7 F (37.1 C), temperature source Oral, resp. rate 16, SpO2 93 %.  Medical Problem List and Plan:  1. Decreased functional mobility secondary to osteomyelitis of vertebral of thoracolumbar region. Status post L1 corpectomy with pedicle screw fixation T11, 12, L2 and L3 01/22/2020. TLSO back brace donned and supine.  -patient may Shower- cover incision- might need 2 TLSOs  4/6- had fall last night- has 8/10 headache and paraspinal soreness/pain- will check CT of head  4/8- recovered from fall- HA gone- CT (-) MRI (-) -ELOS/Goals: ~ 8-12 days- goals supervision to min assist   Continue CIR PT, OT 2. Antithrombotics:  -DVT/anticoagulation: bilateral post-tib and peroneal DVT's.    -xarelto initiated yesterday   4/3 -reviewed plan/treatment with patient -antiplatelet therapy: N/A  3. Pain Management: Lyrica 150 mg twice daily, Lidoderm patch, Valium as needed muscle spasms as well as oxycodone as needed   4/5- due to descriptions of spasticity- will try Adding Baclofen 5 mg TID  4/7- Will change Baclofen to 5 mg TID prn- since having increased confusion  4/8- pt much  more with it, not sure if time or baclofen prn that helped  4/10: well controlled  4/11: kpad for muscles of neck and upper back.  4/12- although pt having spasticity, don't want to sedate her  4/13- stopped baclofen because Cr up to 1.93- don't want to confuse/sedate pt.  4. Mood: Cymbalta 60 mg daily  -antipsychotic agents: N/A  5. Neuropsych: This patient is capable of making decisions on her own behalf.  6. Skin/Wound Care: Routine skin checks  7. Fluids/Electrolytes/Nutrition: Routine in and outs with follow-up chemistries  8. ID. 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 Follow-up per infectious disease   4/13- pt's Cr keeps bumping up- not sure why but only culprit is Cefipime- will check with ID/pharmacy if any other options? 9. Diabetes mellitus. Lantus insulin as directed.    CBG (last 3)  Recent Labs  02/09/20 1629 02/09/20 2111 02/10/20 0603  GLUCAP 212* 320* 141*       4/9- good control overall- con't meds   4/10: elevated. Increased Lantus to 35 BID   4/11: BG well controlled.  4/12- BGs elevated- pt has snacks at bedside? Will wait to change Lantus   4/13- Pts labile today- 141-320-  10. CKD stage III. Follow-up chemistries  4/2- Cr stable at 1.24- recheck Monday  4/5- Cr up to 1.61- could be due to being dry- will recheck in AM after 1 unit pRBCs  4/6- Cr down to 1.48- has improved- con't to push fluids  4/8- Cr 1.59- over last 2 weeks, running 1.2-1.6- does have CKD Stage III- but could be IV ABX- will give IVFs low dose x 24 hours- 60cc NS  4/9- Cr down to 1.49 today after IVFs- now off IVFs- con't regimen and recheck Monday  4/13- Cr 1.93- will give blood today and likely IVFs AFTER blood.  11. 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 Wound care left foot as directed. Follow-up per Dr. Sharol Given  12. Acute on chronic anemia. Follow-up CBC   4/1- Hb 7.3- might  need transfusion- will monitor   4/4 Recheck CBC 4/5  4/5- CBC Hb 6.7- s/p 1 unit pRBCs- will recheck in AM  4/6- Hb up to 8.1- con't regimen-   4/10: Hgb 8.0  4/13- Hb 6.8- give 1 unit pRBCs- and recheck in AM- check hemoccult and monitor plts- down to 109k 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 Patient did have assistance at home. Has "seeing eye dog" and husband  61. Constipation. Continue Senokot. MiraLAX as needed. No nausea vomiting   4/2- 4 BMs yesterday-will stop Colace BID and con't senokot BID  4/4 seems to be moving bowels daily now  4/5- 4 BMs yesterday- will reduce Senokot to 1 tab/day  4/8- says had 5 BM overnight- had 1 documented- will have her refuse senokot if she wants to, and monitor-  4/12- Pt insisting having man BMs/diarrhea- not documented- will talk with her.    4/13- d/c imodium and start Metamucil per pt request- is appropriate- will also check Hemoccult- since dropping Hb.,  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 Fall overnight with new confusion  4/6- getting head CT and monitor- strength unchanged- appears in pain and tremors slightly worse- no other changes  4/7- MRI of brain negative for anything acute- still confused per staff- will make baclofen prn; also could be from IV ABX per pharmacist- will monitor closely.   4/8- cognition is better today- MRI (-)  17. L heel wound, unstageable.   4/9- will consult wound care and get healing shoe to take weight off heel and wound care suggestions.  18. Pruritis: Added hydroxyzine 57m TID prn 19. Thrombocytopenia  4/13- down to 109 from 180- will recheck in AM- Needs Xarelto, so if decreases more, will call IM about plan.     LOS: 12 days A FACE TO FACE EVALUATION WAS PERFORMED  [name] [name] 02/10/2020, 10:20 AM

## 2020-02-10 NOTE — Progress Notes (Addendum)
Consent obtained 04/05 for blood, blood band intact on pt. Follow up labs already in for tomorrow   Called blood bank, blood is ready

## 2020-02-10 NOTE — Progress Notes (Signed)
CRITICAL VALUE ALERT  Critical Value:  6.8  Date & Time Notied: 4/13, 0538  Provider Notified: [phone]  Orders Received/Actions taken: Continue to monitor, provider will put in orders after input from team.

## 2020-02-10 NOTE — Progress Notes (Signed)
Blood completed, vs documented, pt reports that feeling a little better, fluids running. Will request iv team to switch out with new bag of NS so that ABT can be administered and return to NS at 37ms. Pt tolerated well

## 2020-02-10 NOTE — Progress Notes (Signed)
Physical Therapy Session Note  Patient Details  Name: [name] [name] MRN: [id number] Date of Birth: 09/12/1956  Today's Date: 02/10/2020 PT Individual Time: 0805-0900 PT Individual Time Calculation (min): 55 min    Short Term Goals: Week 1:  PT Short Term Goal 1 (Week 1): STG=LTGs  Skilled Therapeutic Interventions/Progress Updates:    Pt supine in bed upon PT arrival, agreeable to therapy tx and reports pain 9/10 in her back, limited activity as needed for pain relief. Pt with low hemoglobin today (6.8) limited activity to patients tolerance. Pt eating breakfast when therapist arrives, pt agreeable to getting up OOB to finish breakfast. Pt transferred to sitting EOB with supervision, therapist assisted with donning TLSO. Stand pivot to w/c with min assist. While patient is finishing breakfast, discussed d/c planning. Pt reports husband is coming in today and on Thursday for family education. Pt reports that she does not have a RW at home and will need one. Pt transported to the gym in w/c. Pt performed stand pivot to the mat with CGA and RW, cues for techniques and RW management. Pt reports shaking and feeling weak this morning. Pt performed short walk this morning x 15 ft with RW and min assist, x 1 "buckling" of LEs but no LOB noted, increased cues for turn to sit in w/c. Pt reports continued weakness/shakiness. Performed seated therex for strengthening, 2 x 10 each - seated marches and seated LAQ. Pt reports having to use the bathroom, transported back to room in w/c. Stand pivot this session w/c<>toilet with min assist and RW, cues for techniques and safety. Standing balance min assist while performing clothing management, continent of bladder and performs pericare while standing. During transfer pt with intermittent buckling/shaking, pt saying "woah, Gweneth Dimitri" requires cues to back up to w/c and to sit when safe. Pt sitting in w/c at the sink to wash hands with assist as needed to reach paper  towels/soap. Pt left in w/c at end of session with needs in reach and chair alarm set.   Therapy Documentation Precautions:  Precautions Precautions: Back, Fall, Other (comment)(has had issues w/orthostatic hypotension) Precaution Comments: Pt instructed in back precautions, and to self-monitor for activity tolerance, Required Braces or Orthoses: Spinal Brace Spinal Brace: Thoracolumbosacral orthotic, Applied in supine position Restrictions Weight Bearing Restrictions: No     Therapy/Group: Individual Therapy  Netta Corrigan, PT, DPT, [facility] 02/10/2020, 8:25 AM

## 2020-02-11 ENCOUNTER — Inpatient Hospital Stay (HOSPITAL_COMMUNITY): Payer: PRIVATE HEALTH INSURANCE

## 2020-02-11 ENCOUNTER — Inpatient Hospital Stay (HOSPITAL_COMMUNITY): Payer: PRIVATE HEALTH INSURANCE | Admitting: Physical Therapy

## 2020-02-11 ENCOUNTER — Inpatient Hospital Stay (HOSPITAL_COMMUNITY): Payer: PRIVATE HEALTH INSURANCE | Admitting: Occupational Therapy

## 2020-02-11 LAB — TYPE AND SCREEN
ABO/RH(D): A NEG
Antibody Screen: NEGATIVE
Unit division: 0

## 2020-02-11 LAB — BASIC METABOLIC PANEL
Anion gap: 7 (ref 5–15)
BUN: 34 mg/dL — ABNORMAL HIGH (ref 8–23)
CO2: 23 mmol/L (ref 22–32)
Calcium: 8.5 mg/dL — ABNORMAL LOW (ref 8.9–10.3)
Chloride: 106 mmol/L (ref 98–111)
Creatinine, Ser: 2.33 mg/dL — ABNORMAL HIGH (ref 0.44–1.00)
GFR calc Af Amer: 25 mL/min — ABNORMAL LOW (ref >60)
GFR calc non Af Amer: 21 mL/min — ABNORMAL LOW (ref >60)
Glucose, Bld: 134 mg/dL — ABNORMAL HIGH (ref 70–99)
Potassium: 4.3 mmol/L (ref 3.5–5.1)
Sodium: 136 mmol/L (ref 135–145)

## 2020-02-11 LAB — BPAM RBC
Blood Product Expiration Date: 202104292359
ISSUE DATE / TIME: 202104131321
Unit Type and Rh: 600

## 2020-02-11 LAB — CBC WITH DIFFERENTIAL/PLATELET
Abs Immature Granulocytes: 0.05 10*3/uL (ref 0.00–0.07)
Basophils Absolute: 0 10*3/uL (ref 0.0–0.1)
Basophils Relative: 1 %
Eosinophils Absolute: 0.2 10*3/uL (ref 0.0–0.5)
Eosinophils Relative: 3 %
HCT: 25.5 % — ABNORMAL LOW (ref 36.0–46.0)
Hemoglobin: 7.7 g/dL — ABNORMAL LOW (ref 12.0–15.0)
Immature Granulocytes: 1 %
Lymphocytes Relative: 20 %
Lymphs Abs: 1.2 10*3/uL (ref 0.7–4.0)
MCH: 26.6 pg (ref 26.0–34.0)
MCHC: 30.2 g/dL (ref 30.0–36.0)
MCV: 87.9 fL (ref 80.0–100.0)
Monocytes Absolute: 0.8 10*3/uL (ref 0.1–1.0)
Monocytes Relative: 14 %
Neutro Abs: 3.8 10*3/uL (ref 1.7–7.7)
Neutrophils Relative %: 61 %
Platelets: 115 10*3/uL — ABNORMAL LOW (ref 150–400)
RBC: 2.9 MIL/uL — ABNORMAL LOW (ref 3.87–5.11)
RDW: 15.9 % — ABNORMAL HIGH (ref 11.5–15.5)
WBC: 6.1 10*3/uL (ref 4.0–10.5)
nRBC: 0 % (ref 0.0–0.2)

## 2020-02-11 LAB — GLUCOSE, CAPILLARY
Glucose-Capillary: 123 mg/dL — ABNORMAL HIGH (ref 70–99)
Glucose-Capillary: 137 mg/dL — ABNORMAL HIGH (ref 70–99)
Glucose-Capillary: 53 mg/dL — ABNORMAL LOW (ref 70–99)
Glucose-Capillary: 53 mg/dL — ABNORMAL LOW (ref 70–99)
Glucose-Capillary: 106 mg/dL — ABNORMAL HIGH (ref 70–99)
Glucose-Capillary: 66 mg/dL — ABNORMAL LOW (ref 70–99)
Glucose-Capillary: 166 mg/dL — ABNORMAL HIGH (ref 70–99)

## 2020-02-11 LAB — APTT: aPTT: 62 seconds — ABNORMAL HIGH (ref 24–36)

## 2020-02-11 LAB — HEPARIN LEVEL (UNFRACTIONATED): Heparin Unfractionated: >2.2 IU/mL — ABNORMAL HIGH (ref 0.30–0.70)

## 2020-02-11 MED ORDER — PREGABALIN 75 MG PO CAPS
150.0000 mg | ORAL_CAPSULE | Freq: Every day | ORAL | Status: DC
  Administered 2020-02-12 – 2020-02-13 (×2): 150 mg via ORAL
  Filled 2020-02-11 (×2): qty 2

## 2020-02-11 MED ORDER — HEPARIN (PORCINE) 25000 UT/250ML-% IV SOLN
1200.0000 [IU]/h | INTRAVENOUS | Status: AC
  Administered 2020-02-11: 950 [IU]/h via INTRAVENOUS
  Administered 2020-02-12 – 2020-02-15 (×4): 1100 [IU]/h via INTRAVENOUS
  Administered 2020-02-17 – 2020-02-18 (×2): 1200 [IU]/h via INTRAVENOUS
  Filled 2020-02-11 (×8): qty 250

## 2020-02-11 MED ORDER — SODIUM CHLORIDE 0.9 % IV SOLN
2.0000 g | INTRAVENOUS | Status: DC
  Administered 2020-02-12 – 2020-02-14 (×3): 2 g via INTRAVENOUS
  Filled 2020-02-11 (×3): qty 2

## 2020-02-11 NOTE — Progress Notes (Signed)
Ordered foley

## 2020-02-11 NOTE — Progress Notes (Signed)
[facility] PHYSICAL MEDICINE & REHABILITATION PROGRESS NOTE   Subjective/Complaints:  Pt reports she's very fatigued today.  Also didn't drink a lot yesterday since was asleep most of day- hasn't been able to pee/void much overnight- last time voided was 10pm- saw at 8:15 am.   LBM 4/12- held fibercon and gave senokot.  Also c/o B/L arm and L neck pain which is new.   Also c/o sternal "tightness" that's reproducible-    ROS:   Pt denies SOB, abd pain, CP, N/V/C/D, and vision changes    Objective:   CT ABDOMEN PELVIS WO CONTRAST  Result Date: 02/11/2020 CLINICAL DATA:  64 year old female with pathologic L1 compression fracture last month with abnormal adjacent T12 and L2 segments, treated with operative corpectomy and posterior fusion, with cultures positive for Pseudomonas. Low back pain, query improvement/hematoma. EXAM: CT ABDOMEN AND PELVIS WITHOUT CONTRAST TECHNIQUE: Multidetector CT imaging of the abdomen and pelvis was performed following the standard protocol without IV contrast. COMPARISON:  Preoperative thoracic and lumbar MRI 01/20/2020 and CT Abdomen and Pelvis that day. FINDINGS: Lower chest: Increased small bilateral pleural effusions with mild lung base atelectasis. No pericardial effusion. Calcified coronary artery atherosclerosis and/or stents. Hepatobiliary: Nodular liver contour suggesting cirrhosis (series 3, image 28). Surgically absent gallbladder. Scattered small calcified granulomas in the liver with no other discrete liver lesion in the absence of IV contrast. Pancreas: Negative. Spleen: Stable mild splenomegaly. Occasional calcified splenic granulomas. Adrenals/Urinary Tract: Normal adrenal glands. Noncontrast kidneys appear stable with no hydronephrosis or hydroureter. The urinary bladder is distended, estimated bladder volume 642 mL. No perivesical stranding. No urinary calculus identified. Stomach/Bowel: No dilated large or small bowel. Redundant large bowel with  retained stool throughout, although not significantly changed from last month. Normal retrocecal appendix on series 3, image 73. Negative stomach. No free air. No free fluid. Vascular/Lymphatic: Vascular patency is not evaluated in the absence of IV contrast. Aortoiliac calcified atherosclerosis. Stable mild reactive appearing retroperitoneal lymph nodes. Reproductive: Surgically absent uterus and diminutive or absent ovaries. Other: No pelvic free fluid. Mild generalized increased flank subcutaneous edema. Musculoskeletal: Sequelae of L1 corpectomy and posterior fusion hardware T11 through L3. Mild erosion of the adjacent T12 and L2 endplates appears stable from the preoperative MRI. No adverse hardware features. There is mild paraspinal soft tissue stranding, and some left psoas muscle gas at the corpectomy level. No discrete postoperative hematoma. But there is new T11 superior endplate compression on series 6, image 13. The T11 pedicle screws do closely approximate the superior endplate. Underlying osteopenia. No other new osseous abnormality. IMPRESSION: 1. Sequelae of L1 corpectomy and posterior fusion with hardware T11 through L3. No hardware loosening identified, but there is a new mild T11 superior endplate compression fracture. Mild erosion of the T12 and L2 endplates appears stable from the preoperative MRI. Mild lumbar paraspinal inflammation with no discrete postoperative hematoma. 2. Increased small bilateral pleural effusions with mild lung base atelectasis. 3. Cirrhosis with mild splenomegaly. 4. Distended urinary bladder, estimated at 642 mL. 5. Aortic Atherosclerosis (ICD10-I70.0). Electronically Signed   By: Genevie Ann M.D.   On: 02/11/2020 13:56   US RENAL  Result Date: 02/11/2020 CLINICAL DATA:  Urinary retention EXAM: RENAL / URINARY TRACT ULTRASOUND COMPLETE COMPARISON:  CT 02/11/2020, ultrasound 04/16/2019 FINDINGS: Right Kidney: Renal measurements: 10.6 x 5.1 x 4.8 cm = volume: 135 mL .  Echogenicity within normal limits. No mass or hydronephrosis visualized. Left Kidney: Renal measurements: 11.0 x 4.8 x 4.8 cm = volume: 132 mL.  Echogenicity within normal limits. No mass or hydronephrosis visualized. Bladder: Moderately distended, but appears otherwise unremarkable. Other: Heterogeneous appearance of the visualized portion of the liver, compatible with known cirrhosis. IMPRESSION: 1. Moderately distended urinary bladder. 2. Unremarkable kidneys without hydronephrosis. 3. Partially visualized cirrhotic liver. Electronically Signed   By: Davina Poke D.O.   On: 02/11/2020 14:20   Recent Labs    02/10/20 0400 02/11/20 0435  WBC 6.3 6.1  HGB 6.8* 7.7*  HCT 23.3* 25.5*  PLT 109* 115*   Recent Labs    02/10/20 0400 02/11/20 0435  NA 136 136  K 4.6 4.3  CL 105 106  CO2 24 23  GLUCOSE 164* 134*  BUN 27* 34*  CREATININE 1.93* 2.33*  CALCIUM 8.7* 8.5*    Intake/Output Summary (Last 24 hours) at 02/11/2020 1919 Last data filed at 02/11/2020 1853 Gross per 24 hour  Intake 1310.65 ml  Output 1200 ml  Net 110.65 ml     Physical Exam: Vital Signs Blood pressure (!) 154/69, pulse 77, temperature 98.2 F (36.8 C), temperature source Oral, resp. rate 16, weight 81.7 kg, SpO2 96 %. Constitutional :laying in bed; nurse at bedside; voice high- like using helium, appropriate otherwise, c/o fatigue, NAD HEENT: conjugate gaze; slightly less pale conjunctivae Neck: supple Cardiovascular: RRR- reproducible sternal pain with palpation on exam Respiratory/Chest: coarse breath sounds B/L- good air movement (nurse reported crackles- did not hearing W/R/R/crackles) GI/Abdomen:soft- somewhat distended; slightly TTP over bladder, (+)BS hypoactive Ext: no clubbing, cyanosis, or edema Psych: fatigued and slightly anxious about urinary retention Musculoskeletal: Brace on/in place TTP over posterior head- no bumps/lesions from fall TTP over thoracic and lumbar paraspinals however not on  spine itself- no TTP midline except normal over incision area.  Cervical back: Normal range of motion and neck supple.  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 Functional deficits secondary to osteomyelitis of vertebral of thoracolumbar region. Status post L1 corpectomy with pedicle screw fixation T11, 12, L2 and L3 01/22/2020 which require 3+ hours per day of interdisciplinary therapy in a comprehensive inpatient rehab setting.  Physiatrist is providing close team supervision and 24 hour management of active medical problems listed below.  Physiatrist and rehab team continue to assess barriers to discharge/monitor patient progress toward functional and medical goals  Care Tool:  Bathing    Body parts bathed by patient: Right arm, Left arm, Chest, Abdomen, Front perineal area, Right upper leg, Left upper leg, Face, Right lower leg, Left lower leg   Body parts bathed by helper: Buttocks     Bathing assist Assist Level: Minimal Assistance - Patient > 75%     Upper Body Dressing/Undressing Upper body dressing   What is the patient wearing?: Pull over shirt, Orthosis Orthosis activity level: Performed by helper  Upper body assist Assist Level: Minimal Assistance - Patient > 75%    Lower Body Dressing/Undressing Lower body dressing      What is the patient wearing?: Underwear/pull up, Pants  Lower body assist Assist for lower body dressing: Minimal Assistance -  Patient > 75%     Toileting Toileting    Toileting assist Assist for toileting: Minimal Assistance - Patient > 75%     Transfers Chair/bed transfer  Transfers assist     Chair/bed transfer assist level: Minimal Assistance - Patient > 75%     Locomotion Ambulation   Ambulation assist      Assist level: Minimal Assistance - Patient > 75% Assistive device: Walker-rolling Max distance: 15 ft   Walk 10 feet activity   Assist     Assist level: Minimal Assistance - Patient > 75% Assistive device: Walker-rolling   Walk 50 feet activity   Assist Walk 50 feet with 2 turns activity did not occur: Safety/medical concerns  Assist level: Contact Guard/Touching assist Assistive device: Walker-rolling    Walk 150 feet activity   Assist Walk 150 feet activity did not occur: Safety/medical concerns         Walk 10 feet on uneven surface  activity   Assist     Assist level: Moderate Assistance - Patient - 50 - 74% Assistive device: Aeronautical engineer Will patient use wheelchair at discharge?: No             Wheelchair 50 feet with 2 turns activity    Assist            Wheelchair 150 feet activity     Assist          Blood pressure (!) 154/69, pulse 77, temperature 98.2 F (36.8 C), temperature source Oral, resp. rate 16, weight 81.7 kg, SpO2 96 %.  Medical Problem List and Plan:  1. Decreased functional mobility secondary to osteomyelitis of vertebral of thoracolumbar region. Status post L1 corpectomy with pedicle screw fixation T11, 12, L2 and L3 01/22/2020. TLSO back brace donned and supine.  -patient may Shower- cover incision- might need 2 TLSOs  4/6- had fall last night- has 8/10 headache and paraspinal soreness/pain- will check CT of head  4/8- recovered from fall- HA gone- CT (-) MRI (-)  4/14- new mild T11 endplate compression fx- no hematoma -ELOS/Goals: ~ 8-12 days- goals supervision to min assist     Continue CIR PT, OT 2. Antithrombotics:  -DVT/anticoagulation: bilateral post-tib and peroneal DVT's.    -xarelto initiated yesterday   4/3 -reviewed plan/treatment with patient   4/14- Changed to Heparin gtt because Cr 2.33- will con't until Cr improves -antiplatelet therapy: N/A  3. Pain Management: Lyrica 150 mg twice daily, Lidoderm patch, Valium as needed muscle spasms as well as oxycodone as needed   4/5- due to descriptions of spasticity- will try Adding Baclofen 5 mg TID  4/7- Will change Baclofen to 5 mg TID prn- since having increased confusion  4/8- pt much more with it, not sure if time or baclofen prn that helped  4/10: well controlled  4/11: kpad for muscles of neck and upper back.  4/12- although pt having spasticity, don't want to sedate her  4/13- stopped baclofen because Cr up to 1.93- don't want to confuse/sedate pt.  4/14- will hold Lyrica to dialysis levels for a few days until Cr improves  4. Mood: Cymbalta 60 mg daily  -antipsychotic agents: N/A  5. Neuropsych: This patient is capable of making decisions on her own behalf.  6. Skin/Wound Care: Routine skin checks  7. Fluids/Electrolytes/Nutrition: Routine in and outs with follow-up chemistries  8. ID. Continue cefepime  through 03/05/2020 for pseduomonas. Follow-up per infectious disease   4/13- pt's Cr keeps bumping up- not sure why but only culprit is Cefipime- will check with ID/pharmacy if any other options?  4/14- ESR >140- con't ABX for now- will consult ID again if needed 9. Diabetes mellitus. Lantus insulin as directed.    CBG (last 3)  Recent Labs    02/11/20 1710 02/11/20 1736 02/11/20 1817  GLUCAP 53* 66* 106*       4/9- good control overall- con't meds   4/10: elevated. Increased Lantus to 35 BID   4/11: BG well controlled.  4/12- BGs elevated- pt has snacks at bedside? Will wait to change Lantus   4/13- Pts labile today- 141-320-  4/14- BG very low today- won't change anything TODAY- likely  not eating so tired, will monitor closely.   10. CKD stage III. Follow-up chemistries  4/2- Cr stable at 1.24- recheck Monday  4/5- Cr up to 1.61- could be due to being dry- will recheck in AM after 1 unit pRBCs  4/6- Cr down to 1.48- has improved- con't to push fluids  4/8- Cr 1.59- over last 2 weeks, running 1.2-1.6- does have CKD Stage III- but could be IV ABX- will give IVFs low dose x 24 hours- 60cc NS  4/9- Cr down to 1.49 today after IVFs- now off IVFs- con't regimen and recheck Monday  4/13- Cr 1.93- will give blood today and likely IVFs AFTER blood.  4/14- Cr up to 2.33- likely due to urinary retention- Foley placed today- called IM early and they made multiples recs, which were followed out- Xarelto stopped- Heparin gtt started  11. 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 Wound care left foot as directed. Follow-up per Dr. Sharol Given  12. Acute on chronic anemia. 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 Patient did have assistance at home. Has "seeing eye dog" and husband  25. Constipation. Continue Senokot. MiraLAX as needed. No nausea vomiting   4/2- 4 BMs yesterday-will stop Colace BID and con't senokot BID  4/4 seems to be moving bowels daily now  4/5- 4 BMs yesterday- will reduce Senokot to 1 tab/day  4/8- says had 5 BM overnight- had 1 documented- will have her refuse senokot if she wants to, and monitor-  4/12- Pt insisting having man BMs/diarrhea- not documented- will talk with her.    4/13- d/c imodium and start Metamucil per pt request- is appropriate- will also check  Hemoccult- since dropping Hb.,   4/14- LBM 2 days ago- nursing to work on.  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 Fall overnight with new confusion  4/6- getting head CT and monitor- strength unchanged- appears in pain and tremors slightly worse- no other changes  4/7- MRI of brain negative for anything acute- still confused per staff- will make baclofen prn; also could be from IV ABX per pharmacist- will monitor closely.   4/8- cognition is better today- MRI (-)  17.  L heel wound, unstageable.   4/9- will consult wound care and get healing shoe to take weight off heel and wound care suggestions.  18. Pruritis: Added hydroxyzine 83m TID prn 19. Thrombocytopenia  4/13- down to 109 from 180- will recheck in AM- Needs Xarelto, so if decreases more, will call IM about plan.   4/14- as above.     LOS: 13 days A FACE TO FACE EVALUATION WAS PERFORMED  Elmore Hyslop 02/11/2020, 7:19 PM

## 2020-02-11 NOTE — Progress Notes (Signed)
Pharmacy Antibiotic Note  [name] [name] is a 64 y.o. 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 S/p lumbar debridement and hardware stabilization on 3/25.  Pharmacy is consulted for cefepime dosing.  Patient remains afebrile, WBC wnl, and renal function has improved some, est nClCr ~44 ml/min.    Of note, patient has had a change in mental status recently. CT head was neg. MRI wnl  Her scr has risen significantly over the last few days. It's not 2.33. It's likely not related to cefepime as the report adverse effect is <1%.   Plan - Change cefepime 2 gm IV q24h, end date 03/05/20 - Watch renal fxn and adjust dose as indicated - OPAT orders pended   Temp (24hrs), Avg:98.4 F (36.9 C), Min:97.8 F (36.6 C), Max:99.2 F (37.3 C)  Recent Labs  Lab 02/05/20 0504 02/06/20 0410 02/07/20 0852 02/10/20 0400 02/11/20 0435  WBC 7.7  --  6.4 6.3 6.1  CREATININE 1.59* 1.49*  --  1.93* 2.33*    Estimated Creatinine Clearance (by C-G formula based on SCr of 2.33 mg/dL (H)) Female: 23.6 mL/min (A) Female: 29 mL/min (A)    Antimicrobials this admission: Levofloxacin 3/25 >> 3/26 Vancomycin 3/25 >> 3/27 Rifampin 3/26 >> 3/27 Cefepime 3/27 >> (5/7)  Microbiology this admission: 3/23 BCx >> neg 3/23 COVID negative 3/24 MRSA PCR negative 3/25 Bone bx > rare Pseudomonas, S to cefepime   Onnie Boer, PharmD, BCIDP, AAHIVP, CPP Infectious Disease Pharmacist 02/11/2020 1:15 PM

## 2020-02-11 NOTE — Progress Notes (Signed)
Occupational Therapy Note  Patient Details  Name: Faiza Bansal MRN: [id number] Date of Birth: 27-Mar-1956  Today's Date: 02/11/2020 OT Missed Time: 44 Minutes Missed Time Reason: Patient fatigue;Patient ill (comment)  Pt missed 60 mins scheduled OT treatment session due to pt not feeling well.  Per previous OT and RN, pt with increased fatigue, difficulty maintaining arousal, and increased shakiness with activity.  Pt asleep upon arrival, and due not reports of not feeling well per pt therapist allowed pt to rest.  Upon return, pt eyes open but still resting.  Therapist encouraged pt to participate in treatment session, however pt stating not feeling well and requesting to rest at this time.  Will continue to follow per POC.  Simonne Come 02/11/2020, 12:22 PM

## 2020-02-11 NOTE — Progress Notes (Signed)
Physical Therapy Session Note  Patient Details  Name: [name] [name] MRN: [id number] Date of Birth: December 26, 1955  Today's Date: 02/11/2020 PT Individual Time: 1120-1200 0919-8022 PT Individual Time Calculation (min): 40 min 40 min   Short Term Goals: Week 2:  PT Short Term Goal 1 (Week 2): STG = LTG due to ELOS.  Skilled Therapeutic Interventions/Progress Updates:   Session 1  Pt received supine in bed and agreeable to PT at bed level due to LBP and urinary urge with inability to void in standing. Supine Therex instructed by PT: SLR, heel slide, LAQ, ankle PF, hip abduction, hip flexion. Performed with level 2 tband and cues for decreased speed to maximize strengthening aspect of movement. Pt left supine in bed with call bell in reach and all needs met.   Session 2  Pt received supine in bed and agreeable to attempt PT. PT spoke with RN and PA given results of CT to verify appropriateness of skilled PT intervention. Both RN and PA cleared pt for PT. Rolling in bed R and L with min assist for LE positioning. Pt reports new pain in L side of back, stating that she was "pulled around" while CT imaging was being performed, resulting in new pain. RN made aware. PT provided therapeutic use of self as pt became emotional with verbalization of new deficits over the past week. RN then requesting time to place Foley catheter. PT returned and preparing to assist pt to don brace, but pt reporting increasing LPB and asked to remain in bed. RN made aware of increasing pain and pt left in bed.      Therapy Documentation Precautions:  Precautions Precautions: Back, Fall, Other (comment)(has had issues w/orthostatic hypotension) Precaution Comments: Pt instructed in back precautions, and to self-monitor for activity tolerance, Required Braces or Orthoses: Spinal Brace Spinal Brace: Thoracolumbosacral orthotic, Applied in supine position Restrictions Weight Bearing Restrictions: No  missed time:  78mn  ( pain) Pain: Pain Assessment Pain Scale: 0-10 Pain Score: 9  Pain Type: Acute pain Pain Location: Generalized Pain Orientation: Right;Left Pain Descriptors / Indicators: Aching;Discomfort;Dull Pain Frequency: Constant Pain Onset: On-going Pain Intervention(s): Medication (See eMAR)    Therapy/Group: Individual Therapy  ALorie Phenix4/14/2021, 1:08 PM

## 2020-02-11 NOTE — Consult Note (Signed)
WOC Nurse Consult Note: Patient receiving care in [facility] [phone].  I spoke with the patient's primary RN, Santiago Glad Winter, via telephone. Consult completed remotely Reason for Consult: left heel ulcer Wound type: chronic wound, non-healing of many months duration Pressure Injury POA: Yes/No/NA Measurement: See photo from today.  The area has not changed since her original acute care admission weeks ago Wound bed: Dry, hard, brown. No erythema, no edema. Drainage (amount, consistency, odor) no odor, no drainage, no pain Periwound: thickened skin, dry, hard, flaking Dressing procedure/placement/frequency: Follow current wound care orders.  IF there is a concern for osteomyelitis, please obtain appropriate imaging studies. Monitor the wound area(s) for worsening of condition such as: Signs/symptoms of infection,  Increase in size,  Development of or worsening of odor, Development of pain, or increased pain at the affected locations.  Notify the medical team if any of these develop.  Thank you for the consult.  Discussed plan of care with the bedside nurse.  [place] nurse will not follow at this time.  Please re-consult the [facility] team if needed.  No new needs identified.  Val Riles, RN, MSN, CWOCN, CNS-BC, pager [phone]

## 2020-02-11 NOTE — Progress Notes (Signed)
Occupational Therapy Session Note  Patient Details  Name: [name] [name] MRN: [id number] Date of Birth: 1956-05-25  Today's Date: 02/11/2020 OT Individual Time: 0277-4128 OT Individual Time Calculation (min): 35 min    Short Term Goals: Week 1:  OT Short Term Goal 1 (Week 1): STG=LTG d/t ELOS OT Short Term Goal 1 - Progress (Week 1): Progressing toward goal  Skilled Therapeutic Interventions/Progress Updates:    1:1. Pt received in bed stating she feels, "bad" but willing to work. TLSO donned. Pt requires MOD A to elevate trunk to EOB after donning TLSO. Pt reporting feeling, "like floating on a cloud" and "L neck hurting/throat sore." RN alerted and vitals assessed as written below. Pt eats EOB with increased tremors in a 4 extremities. Pt eats half of breakfast but still reports feeling bad. OT determined to end session 77mn early d/t pt feeling ill and with decreased arousal-closing eyes while sitting up. Exited session with pt seated in bed, exit alarm on and call light tin reach  Vitals assessed EOB-136/92, pulse 77, O2 95%  Therapy Documentation Precautions:  Precautions Precautions: Back, Fall, Other (comment)(has had issues w/orthostatic hypotension) Precaution Comments: Pt instructed in back precautions, and to self-monitor for activity tolerance, Required Braces or Orthoses: Spinal Brace Spinal Brace: Thoracolumbosacral orthotic, Applied in supine position Restrictions Weight Bearing Restrictions: No General:   Vital Signs: Therapy Vitals Temp: 98.8 F (37.1 C) Temp Source: Oral Pulse Rate: 71 Resp: 18 BP: (!) 127/57 Patient Position (if appropriate): Lying Oxygen Therapy SpO2: 97 % O2 Device: Room Air Pain:   ADL: ADL Eating: Set up Where Assessed-Eating: Wheelchair Grooming: Setup Where Assessed-Grooming: Edge of bed Upper Body Bathing: Supervision/safety Where Assessed-Upper Body Bathing: Bed level Lower Body Bathing: Minimal assistance Where  Assessed-Lower Body Bathing: Edge of bed Upper Body Dressing: Moderate assistance(shirt and TLSO in supine) Where Assessed-Upper Body Dressing: Bed level Lower Body Dressing: Minimal assistance Where Assessed-Lower Body Dressing: Edge of bed Toileting: Moderate assistance Where Assessed-Toileting: Bedside Commode Toilet Transfer: Minimal assistance Toilet Transfer Method: Stand pivot Vision   Perception    Praxis   Exercises:   Other Treatments:     Therapy/Group: Individual Therapy  STonny Branch4/14/2021, 7:02 AM
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
## 2020-02-11 NOTE — Progress Notes (Signed)
Social Work [name], adult   DOB: 1956-01-30, 64 y.o.   MRN: [id number]   SW met with pt in room to follow-up about plan of care for her at d/c. Pt states that she believes that her husband will be taking time off. SW encouraged pt husband to bring in FMLA forms if needed.   SW spoke with pt husband [name] [name] [phone]) to discuss plan of care for pt. He reported that he intends to increase her aide hours, and to adjust his work schedule so pt will have support 99% of the time. SW discussed DME: w/c that will be ordered, and pt requiring IV abx at discharge. Reports preferred home infusion is [facility] as they have used in the past. SW informed that the home infusion company will secure HHA. SW indicated will follow-up once there are more details.  SW spoke with [facility] [phone]) to discuss referral. Referral accepted and will begin to secure HHSN/PT/OT. SW waiting on follow-up to discuss HHA, and provide orders.   *Medical team reports concerns related to pt creatinine levels and states possible delay in pt d/c. SW left message for Pam Chandler/[facility] to inform on above, and SW will follow-up once there is more information on pt d/c.   DME order: w/c sent to [facility] via [facility].   Loralee Pacas, MSW, [place] Office: [phone] Cell: [phone] Fax: [phone]

## 2020-02-11 NOTE — Progress Notes (Signed)
Saw pt during shift change with therapy, pt reported feeling a tightness in chest but over all didn't feel well. Saw pt again and continued to not feel well, reported that is shaking, has pain to left side of neck and bilateral upper extremities, no c/o of SOB all though fine crackles noted with O2 sat 95% on RA, no diaphoresis noted,  had pt cough a few times to see if cleared but no changes. No pitting edema noted to extremities. PA notified and will wait for MD to see pt at this time. Continue with care plan, bed in low position and bed alarm on and call bell in reach.

## 2020-02-11 NOTE — Progress Notes (Signed)
ANTICOAGULATION CONSULT NOTE - Follow Up Consult  Pharmacy Consult for Xarelto>>IV heparin Indication: DVT  Allergies  Allergen Reactions  . Bee Pollen Anaphylaxis  . Fish-Derived Products Hives, Shortness Of Breath, Swelling and Rash    Hives get in throat causing trouble breathing  . Mushroom Extract Complex Anaphylaxis  . Penicillins Anaphylaxis    **Tolerated cefepime March 2021 Did it involve swelling of the face/tongue/throat, SOB, or low BP? Yes Did it involve sudden or severe rash/hives, skin peeling, or any reaction on the inside of your mouth or nose? No Did you need to seek medical attention at a hospital or doctor's office? Yes When did it last happen?A few months ago If all above answers are "NO", may proceed with cephalosporin use.  . Rosemary Oil Anaphylaxis  . Shellfish Allergy Hives, Shortness Of Breath, Swelling and Rash  . Tomato Hives and Shortness Of Breath    Hives in throat causes her trouble breathing  . Acetaminophen Other (See Comments)    GI upset  . Acyclovir And Related Other (See Comments)    Unknown rxn  . Aloe Vera Hives  . Broccoli [Brassica Oleracea] Hives  . Naproxen Other (See Comments)    Unknown rxn    Patient Measurements: Wt: 81.7 kg   Vital Signs: Temp: 98.2 F (36.8 C) (04/14 1408) Temp Source: Oral (04/14 1408) BP: 154/69 (04/14 1408) Pulse Rate: 77 (04/14 1408)  Labs: Recent Labs    02/10/20 0400 02/11/20 0435  HGB 6.8* 7.7*  HCT 23.3* 25.5*  PLT 109* 115*  CREATININE 1.93* 2.33*    Estimated Creatinine Clearance (by C-G formula based on SCr of 2.33 mg/dL (H)) Female: 23.6 mL/min (A) Female: 29 mL/min (A)   Medical History: Past Medical History:  Diagnosis Date  . Acute MI (HCC) 2007   presented to ED & had cardiac cath- but found to have normal coronaries. Since that point in time her PCP cares f or cardiac needs. Dr. Lowen - [place] [facility]  . Anemia   . Anginal pain (HCC)   . Anxiety   . Asthma   .  Bulging lumbar disc   . Cataract   . 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 Cirrhosis of liver without mention of alcohol   . Constipation   . Dehiscence of closure of skin    left partial calcaneal excision  . Depression   . Diabetes mellitus    insulin dependent, adult onset  . Episode of visual loss of left eye   . Exertional shortness of breath   . Fatty liver   . Fibromyalgia   . GERD (gastroesophageal reflux disease)   . Hepatic steatosis   . High cholesterol   . Hypertension   . MRSA (methicillin resistant Staphylococcus aureus)   . Neuropathy    lower legs  . Osteoarthritis    hands, hips  . Proximal humerus fracture 10/15/12   Left  . PTSD (post-traumatic stress disorder)   . Renal insufficiency 05/05/2015    Medications:  Medications Prior to Admission  Medication Sig Dispense Refill Last Dose  . atorvastatin (LIPITOR) 10 MG tablet Take 1 tablet by mouth once daily 90 tablet 1   . CALCIUM ALGINATE EX Apply 1 patch topically every other day.     . ceFEPime (MAXIPIME) IVPB Inject 2 g into the vein every 12 (twelve) hours. Indication:  Hardware associated vertebral infection Last Day of Therapy:  03/05/2020   Labs - Once weekly:  CBC/D and BMP, Labs - Every other week:  ESR and CRP 80 Units 0   . DULoxetine (CYMBALTA) 60 MG capsule Take 1 capsule by mouth once daily (Patient taking differently: Take 60 mg by mouth daily. ) 90 capsule 1   . Ensure Max Protein (ENSURE MAX PROTEIN) LIQD Take 330 mLs (11 oz total) by mouth 2 (two) times daily. 3300 mL 0   . EPINEPHrine 0.3 mg/0.3 mL IJ SOAJ injection Inject into the muscle.     . fluticasone (FLONASE) 50 MCG/ACT nasal spray Place 2 sprays into both nostrils daily. (Patient not taking: Reported on 01/20/2020) 16 g 6   . HUMALOG KWIKPEN 200 UNIT/ML SOPN INJECT 13 UNITS SUBCUTANEOUSLY ONCE DAILY (Patient taking differently: Inject 13 Units into the skin daily. ) 6 mL 2   .  HYDROcodone-acetaminophen (NORCO/VICODIN) 5-325 MG tablet Take 1-2 tablets by mouth every 6 (six) hours as needed for moderate pain or severe pain. 120 tablet 0   . Insulin Glargine (LANTUS SOLOSTAR) 100 UNIT/ML Solostar Pen INJECT 20 UNITS SUBCUTANEOUSLY ONCE DAILY IN THE MORNING AND THEN INJECT 40 UNITS AT BEDTIME (Patient taking differently: Inject 20-40 Units into the skin 2 (two) times daily. INJECT 20 UNITS SUBCUTANEOUSLY ONCE DAILY IN THE MORNING AND THEN INJECT 40 UNITS AT BEDTIME) 45 mL 0   . lidocaine (LIDODERM) 5 % Place 1 patch onto the skin daily. Remove & Discard patch within 12 hours or as directed by MD 30 patch 0   . Multiple Vitamins-Minerals (MULTIVITAMIN GUMMIES WOMENS) CHEW Chew 2 each by mouth daily.     . Naftifine HCl 2 % CREA Apply qd for up to 4 weeks (Patient not taking: Reported on 01/20/2020) 60 g 2   . ondansetron (ZOFRAN ODT) 4 MG disintegrating tablet Take 1 tablet (4 mg total) by mouth every 8 (eight) hours as needed for nausea or vomiting. 20 tablet 0   . ondansetron (ZOFRAN ODT) 4 MG disintegrating tablet Take 1 tablet (4 mg total) by mouth every 4 (four) hours as needed for nausea or vomiting. (Patient not taking: Reported on 01/20/2020) 20 tablet 0   . polyethylene glycol (MIRALAX / GLYCOLAX) packet Take 17 g by mouth daily as needed for mild constipation.      . pregabalin (LYRICA) 150 MG capsule Take 1 capsule (150 mg total) by mouth 2 (two) times daily. 180 capsule 1   . promethazine (PHENERGAN) 25 MG suppository Place 1 suppository (25 mg total) rectally every 6 (six) hours as needed for nausea or vomiting. (Patient not taking: Reported on 01/20/2020) 12 each 0     Assessment: 64 YOF with new bilateral DVTs. Pharmacy consulted to start Xarelto. Of note, patient fell recently but CT head yesterday was negative.   Her scr has worsened over the past few days and it's not at 2.33 (CrCl ~24ml/min). Xarelto is not recommended in this situation. We are going to switch  her to IV heparin instead of lovenox due to her low baseline hgb after discussion with Dr. Raulkar. We will monitor with PTT for now due to her Xarelto.    Goal of Therapy:  Heparin level 0.3-0.7 APTT 66-102 Monitor platelets by anticoagulation protocol: Yes   Plan:   Dc Xarelto Baseline PTT and HL Heparin 950 units/hr starting at 2000  8 hr PTT Daily PTT, HL, CBC  Minh Pham, PharmD, BCIDP, AAHIVP, CPP Infectious Disease Pharmacist 02/11/2020 2:46 PM        

## 2020-02-12 ENCOUNTER — Inpatient Hospital Stay (HOSPITAL_COMMUNITY): Payer: PRIVATE HEALTH INSURANCE

## 2020-02-12 ENCOUNTER — Inpatient Hospital Stay (HOSPITAL_COMMUNITY): Payer: PRIVATE HEALTH INSURANCE | Admitting: Occupational Therapy

## 2020-02-12 ENCOUNTER — Inpatient Hospital Stay (HOSPITAL_COMMUNITY): Payer: PRIVATE HEALTH INSURANCE | Admitting: Physical Therapy

## 2020-02-12 DIAGNOSIS — E785 Hyperlipidemia, unspecified: Secondary | ICD-10-CM

## 2020-02-12 DIAGNOSIS — L97421 Non-pressure chronic ulcer of left heel and midfoot limited to breakdown of skin: Secondary | ICD-10-CM

## 2020-02-12 DIAGNOSIS — N189 Chronic kidney disease, unspecified: Secondary | ICD-10-CM

## 2020-02-12 DIAGNOSIS — D649 Anemia, unspecified: Secondary | ICD-10-CM

## 2020-02-12 DIAGNOSIS — R338 Other retention of urine: Secondary | ICD-10-CM

## 2020-02-12 LAB — GLUCOSE, CAPILLARY
Glucose-Capillary: 77 mg/dL (ref 70–99)
Glucose-Capillary: 141 mg/dL — ABNORMAL HIGH (ref 70–99)
Glucose-Capillary: 163 mg/dL — ABNORMAL HIGH (ref 70–99)
Glucose-Capillary: 142 mg/dL — ABNORMAL HIGH (ref 70–99)

## 2020-02-12 LAB — COMPREHENSIVE METABOLIC PANEL
ALT: 12 U/L (ref 0–44)
AST: 17 U/L (ref 15–41)
Albumin: 1.8 g/dL — ABNORMAL LOW (ref 3.5–5.0)
Alkaline Phosphatase: 101 U/L (ref 38–126)
Anion gap: 8 (ref 5–15)
BUN: 37 mg/dL — ABNORMAL HIGH (ref 8–23)
CO2: 22 mmol/L (ref 22–32)
Calcium: 8.7 mg/dL — ABNORMAL LOW (ref 8.9–10.3)
Chloride: 107 mmol/L (ref 98–111)
Creatinine, Ser: 2.95 mg/dL — ABNORMAL HIGH (ref 0.44–1.00)
GFR calc Af Amer: 19 mL/min — ABNORMAL LOW (ref >60)
GFR calc non Af Amer: 16 mL/min — ABNORMAL LOW (ref >60)
Glucose, Bld: 109 mg/dL — ABNORMAL HIGH (ref 70–99)
Potassium: 4.4 mmol/L (ref 3.5–5.1)
Sodium: 137 mmol/L (ref 135–145)
Total Bilirubin: 0.6 mg/dL (ref 0.3–1.2)
Total Protein: 6.6 g/dL (ref 6.5–8.1)

## 2020-02-12 LAB — RETICULOCYTES
Immature Retic Fract: 25.2 % — ABNORMAL HIGH (ref 2.3–15.9)
RBC.: 2.96 MIL/uL — ABNORMAL LOW (ref 3.87–5.11)
Retic Count, Absolute: 103 10*3/uL (ref 19.0–186.0)
Retic Ct Pct: 3.5 % — ABNORMAL HIGH (ref 0.4–3.1)

## 2020-02-12 LAB — HIV ANTIBODY (ROUTINE TESTING W REFLEX): HIV Screen 4th Generation wRfx: NONREACTIVE

## 2020-02-12 LAB — LACTATE DEHYDROGENASE: LDH: 153 U/L (ref 98–192)

## 2020-02-12 LAB — CBC WITH DIFFERENTIAL/PLATELET
Abs Immature Granulocytes: 0.03 10*3/uL (ref 0.00–0.07)
Basophils Absolute: 0.1 10*3/uL (ref 0.0–0.1)
Basophils Relative: 1 %
Eosinophils Absolute: 0.2 10*3/uL (ref 0.0–0.5)
Eosinophils Relative: 3 %
HCT: 27.1 % — ABNORMAL LOW (ref 36.0–46.0)
Hemoglobin: 8.1 g/dL — ABNORMAL LOW (ref 12.0–15.0)
Immature Granulocytes: 1 %
Lymphocytes Relative: 18 %
Lymphs Abs: 1.1 10*3/uL (ref 0.7–4.0)
MCH: 26.6 pg (ref 26.0–34.0)
MCHC: 29.9 g/dL — ABNORMAL LOW (ref 30.0–36.0)
MCV: 89.1 fL (ref 80.0–100.0)
Monocytes Absolute: 0.7 10*3/uL (ref 0.1–1.0)
Monocytes Relative: 12 %
Neutro Abs: 4.2 10*3/uL (ref 1.7–7.7)
Neutrophils Relative %: 65 %
Platelets: 127 10*3/uL — ABNORMAL LOW (ref 150–400)
RBC: 3.04 MIL/uL — ABNORMAL LOW (ref 3.87–5.11)
RDW: 16.1 % — ABNORMAL HIGH (ref 11.5–15.5)
WBC: 6.3 10*3/uL (ref 4.0–10.5)
nRBC: 0 % (ref 0.0–0.2)

## 2020-02-12 LAB — HEPATITIS PANEL, ACUTE
HCV Ab: NONREACTIVE
Hep A IgM: NONREACTIVE
Hep B C IgM: NONREACTIVE
Hepatitis B Surface Ag: NONREACTIVE

## 2020-02-12 LAB — CREATININE, URINE, RANDOM: Creatinine, Urine: 34.98 mg/dL

## 2020-02-12 LAB — FOLATE: Folate: 9.9 ng/mL (ref >5.9)

## 2020-02-12 LAB — IRON AND TIBC
Iron: 35 ug/dL (ref 28–170)
Saturation Ratios: 16 % (ref 10.4–31.8)
TIBC: 216 ug/dL — ABNORMAL LOW (ref 250–450)
UIBC: 181 ug/dL

## 2020-02-12 LAB — APTT
aPTT: 75 seconds — ABNORMAL HIGH (ref 24–36)
aPTT: 48 seconds — ABNORMAL HIGH (ref 24–36)

## 2020-02-12 LAB — SODIUM, URINE, RANDOM: Sodium, Ur: 44 mmol/L

## 2020-02-12 LAB — HEMOGLOBIN A1C
Hgb A1c MFr Bld: 6.6 % — ABNORMAL HIGH (ref 4.8–5.6)
Mean Plasma Glucose: 142.72 mg/dL

## 2020-02-12 LAB — VITAMIN B12: Vitamin B-12: 231 pg/mL (ref 180–914)

## 2020-02-12 LAB — FERRITIN: Ferritin: 568 ng/mL — ABNORMAL HIGH (ref 11–307)

## 2020-02-12 MED ORDER — BETHANECHOL CHLORIDE 10 MG PO TABS
10.0000 mg | ORAL_TABLET | Freq: Three times a day (TID) | ORAL | Status: DC
  Administered 2020-02-12 – 2020-02-18 (×18): 10 mg via ORAL
  Filled 2020-02-12 (×18): qty 1

## 2020-02-12 MED ORDER — SODIUM CHLORIDE 0.9 % NICU IV INFUSION SIMPLE
INJECTION | INTRAVENOUS | Status: AC
  Administered 2020-02-12 – 2020-02-13 (×2): 75 mL/h via INTRAVENOUS
  Filled 2020-02-12 (×4): qty 500

## 2020-02-12 NOTE — Progress Notes (Signed)
[facility] PROGRESS NOTE   Subjective/Complaints:   Pt reports she's drinking a lot- having worse hand tremors- hard ot eat dinner last night- did better with breakfast.  Felt like couldn't breathe x1 overnight- cou;dn't sleep as result- Sats OK per nursing.   Also thinks abdomen feels swollen.  L neck- new pain- just on side of L neck-    ROS:   Pt denies SOB, abd pain, CP, N/V/C/D, and vision changes   Objective:   CT ABDOMEN PELVIS WO CONTRAST  Result Date: 02/11/2020 CLINICAL DATA:  64 year old female with pathologic L1 compression fracture last month with abnormal adjacent T12 and L2 segments, treated with operative corpectomy and posterior fusion, with cultures positive for Pseudomonas. Low back pain, query improvement/hematoma. EXAM: CT ABDOMEN AND PELVIS WITHOUT CONTRAST TECHNIQUE: Multidetector CT imaging of the abdomen and pelvis was performed following the standard protocol without IV contrast. COMPARISON:  Preoperative thoracic and lumbar MRI 01/20/2020 and CT Abdomen and Pelvis that day. FINDINGS: Lower chest: Increased small bilateral pleural effusions with mild lung base atelectasis. No pericardial effusion. Calcified coronary artery atherosclerosis and/or stents. Hepatobiliary: Nodular liver contour suggesting cirrhosis (series 3, image 28). Surgically absent gallbladder. Scattered small calcified granulomas in the liver with no other discrete liver lesion in the absence of IV contrast. Pancreas: Negative. Spleen: Stable mild splenomegaly. Occasional calcified splenic granulomas. Adrenals/Urinary Tract: Normal adrenal glands. Noncontrast kidneys appear stable with no hydronephrosis or hydroureter. The urinary bladder is distended, estimated bladder volume 642 mL. No perivesical stranding. No urinary calculus identified. Stomach/Bowel: No dilated large or small bowel. Redundant large bowel with retained stool throughout, although not  significantly changed from last month. Normal retrocecal appendix on series 3, image 73. Negative stomach. No free air. No free fluid. Vascular/Lymphatic: Vascular patency is not evaluated in the absence of IV contrast. Aortoiliac calcified atherosclerosis. Stable mild reactive appearing retroperitoneal lymph nodes. Reproductive: Surgically absent uterus and diminutive or absent ovaries. Other: No pelvic free fluid. Mild generalized increased flank subcutaneous edema. Musculoskeletal: Sequelae of L1 corpectomy and posterior fusion hardware T11 through L3. Mild erosion of the adjacent T12 and L2 endplates appears stable from the preoperative MRI. No adverse hardware features. There is mild paraspinal soft tissue stranding, and some left psoas muscle gas at the corpectomy level. No discrete postoperative hematoma. But there is new T11 superior endplate compression on series 6, image 13. The T11 pedicle screws do closely approximate the superior endplate. Underlying osteopenia. No other new osseous abnormality. IMPRESSION: 1. Sequelae of L1 corpectomy and posterior fusion with hardware T11 through L3. No hardware loosening identified, but there is a new mild T11 superior endplate compression fracture. Mild erosion of the T12 and L2 endplates appears stable from the preoperative MRI. Mild lumbar paraspinal inflammation with no discrete postoperative hematoma. 2. Increased small bilateral pleural effusions with mild lung base atelectasis. 3. Cirrhosis with mild splenomegaly. 4. Distended urinary bladder, estimated at 642 mL. 5. Aortic Atherosclerosis (ICD10-I70.0). Electronically Signed   By: Genevie Ann M.D.   On: 02/11/2020 13:56   US RENAL  Result Date: 02/11/2020 CLINICAL DATA:  Urinary retention EXAM: RENAL / URINARY TRACT ULTRASOUND COMPLETE COMPARISON:  CT 02/11/2020, ultrasound 04/16/2019 FINDINGS: Right Kidney: Renal measurements: 10.6 x 5.1 x 4.8 cm = volume: 135 mL . Echogenicity within normal limits. No mass  or hydronephrosis visualized. Left Kidney: Renal measurements: 11.0 x 4.8 x 4.8 cm = volume: 132 mL. Echogenicity within normal limits. No mass or hydronephrosis visualized. Bladder: Moderately  distended, but appears otherwise unremarkable. Other: Heterogeneous appearance of the visualized portion of the liver, compatible with known cirrhosis. IMPRESSION: 1. Moderately distended urinary bladder. 2. Unremarkable kidneys without hydronephrosis. 3. Partially visualized cirrhotic liver. Electronically Signed   By: Davina Poke D.O.   On: 02/11/2020 14:20   Recent Labs    02/11/20 0435 02/12/20 0458  WBC 6.1 6.3  HGB 7.7* 8.1*  HCT 25.5* 27.1*  PLT 115* 127*   Recent Labs    02/11/20 0435 02/12/20 0458  NA 136 137  K 4.3 4.4  CL 106 107  CO2 23 22  GLUCOSE 134* 109*  BUN 34* 37*  CREATININE 2.33* 2.95*  CALCIUM 8.5* 8.7*    Intake/Output Summary (Last 24 hours) at 02/12/2020 1052 Last data filed at 02/12/2020 0956 Gross per 24 hour  Intake 1059.82 ml  Output 2300 ml  Net -1240.18 ml     Physical Exam: Vital Signs Blood pressure (!) 130/105, pulse 78, temperature 98.2 F (36.8 C), temperature source Oral, resp. rate 18, weight 81.7 kg, SpO2 98 %. Constitutional : awake, sitting up in bed, appropriate, UE tremors notable, but appears same as yesterday, OT at bedside, NAD HEENT: conjugate gaze; slightly less pale conjunctivae Neck: supple, but some noted myofascial TTP in L scalenes mainly Cardiovascular: RRR_ no JVD Respiratory/Chest: little coarse, but good air movement B/L GI/Abdomen: soft, but more distended; appears swollen/gas???; slightly TTP- no rebound, (+) hyperactive BS Ext: no clubbing, cyanosis, or edema Psych:tired; also anxious about medical issues Musculoskeletal: Brace on/in place TTP over posterior head- no bumps/lesions from fall TTP over lumbar fusion site; a little over lumbar paraspinals- no other site except L scalenes Cervical back: Normal range of  motion and neck supple.  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 Functional deficits secondary to osteomyelitis of vertebral of thoracolumbar region. Status post L1 corpectomy with pedicle screw fixation T11, 12, L2 and L3 01/22/2020 which require 3+ hours per day of interdisciplinary therapy in a comprehensive inpatient rehab setting.  Physiatrist is providing close team supervision and 24 hour management of active medical problems listed below.  Physiatrist and rehab team continue to assess barriers to discharge/monitor patient progress toward functional and medical goals  Care Tool:  Bathing    Body parts bathed by patient: Right arm, Left arm, Chest, Abdomen, Front perineal area, Right upper leg, Left upper leg, Face, Right lower leg, Left lower leg   Body parts bathed by helper: Buttocks     Bathing assist Assist Level: Minimal Assistance - Patient > 75%     Upper Body Dressing/Undressing Upper body dressing   What is the patient wearing?: Pull over shirt, Orthosis Orthosis activity level: Performed by helper  Upper body assist Assist Level: Minimal Assistance - Patient > 75%    Lower Body Dressing/Undressing Lower body dressing      What is the patient wearing?: Underwear/pull up, Pants     Lower body assist Assist for lower body dressing: Minimal Assistance -  Patient > 75%     Chartered loss adjuster assist Assist for toileting: Minimal Assistance  - Patient > 75%     Transfers Chair/bed transfer  Transfers assist     Chair/bed transfer assist level: Minimal Assistance - Patient > 75%     Locomotion Ambulation   Ambulation assist      Assist level: Minimal Assistance - Patient > 75% Assistive device: Walker-rolling Max distance: 15 ft   Walk 10 feet activity   Assist     Assist level: Minimal Assistance - Patient > 75% Assistive device: Walker-rolling   Walk 50 feet activity   Assist Walk 50 feet with 2 turns activity did not occur: Safety/medical concerns  Assist level: Contact Guard/Touching assist Assistive device: Walker-rolling    Walk 150 feet activity   Assist Walk 150 feet activity did not occur: Safety/medical concerns         Walk 10 feet on uneven surface  activity   Assist     Assist level: Moderate Assistance - Patient - 50 - 74% Assistive device: Aeronautical engineer Will patient use wheelchair at discharge?: No             Wheelchair 50 feet with 2 turns activity    Assist            Wheelchair 150 feet activity     Assist          Blood pressure (!) 130/105, pulse 78, temperature 98.2 F (36.8 C), temperature source Oral, resp. rate 18, weight 81.7 kg, SpO2 98 %.  Medical Problem List and Plan:  1. Decreased functional mobility secondary to osteomyelitis of vertebral of thoracolumbar region. Status post L1 corpectomy with pedicle screw fixation T11, 12, L2 and L3 01/22/2020. TLSO back brace donned and supine.  -patient may Shower- cover incision- might need 2 TLSOs  4/6- had fall last night- has 8/10 headache and paraspinal soreness/pain- will check CT of head  4/8- recovered from fall- HA gone- CT (-) MRI (-)  4/14- new mild T11 endplate compression fx- no hematoma  4/15- will hold d/c due to medical issues -ELOS/Goals: ~ 8-12 days- goals supervision to min assist   Continue CIR PT, OT 2. Antithrombotics:    -DVT/anticoagulation: bilateral post-tib and peroneal DVT's.    -xarelto initiated yesterday   4/3 -reviewed plan/treatment with patient   4/14- Changed to Heparin gtt because Cr 2.33- will con't until Cr improves   4/15- on Heparin gtt- Cr up to 2.93 -antiplatelet therapy: N/A  3. Pain Management: Lyrica 150 mg twice daily, Lidoderm patch, Valium as needed muscle spasms as well as oxycodone as needed   4/5- due to descriptions of spasticity- will try Adding Baclofen 5 mg TID  4/7- Will change Baclofen to 5 mg TID prn- since having increased confusion  4/8- pt much more with it, not sure if time or baclofen prn that helped  4/10: well controlled  4/11: kpad for muscles of neck and upper back.  4/12- although pt having spasticity, don't want to sedate her  4/13- stopped baclofen because Cr up to 1.93- don't want to confuse/sedate pt.  4/14- will hold Lyrica to dialysis levels for a few days until Cr improves  4. Mood: Cymbalta 60 mg daily  -antipsychotic agents: N/A  5. Neuropsych: This patient is capable of making decisions on her own behalf.  6. Skin/Wound Care: Routine skin checks  7. Fluids/Electrolytes/Nutrition: Routine in and outs  with follow-up chemistries  8. ID. 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 Follow-up per infectious disease   4/13- pt's Cr keeps bumping up- not sure why but only culprit is Cefipime- will check with ID/pharmacy if any other options?  4/14- ESR >140- con't ABX for now- will consult ID again if needed  4/15- called ID- agreed with my concerns in setting of acute urinary retention to rescan/MRI thoracic and lumbar spine; cannot do with contrast due ot elevated Cr. Will order 9. Diabetes mellitus. Lantus insulin as directed.    CBG (last 3)  Recent Labs    02/11/20 1817 02/11/20 2129 02/12/20 0611  GLUCAP 106* 166* 77       4/9- good control overall- con't meds   4/10: elevated. Increased Lantus to 35 BID   4/11: BG well  controlled.  4/12- BGs elevated- pt has snacks at bedside? Will wait to change Lantus   4/13- Pts labile today- 141-320-  4/14- BG very low today- won't change anything TODAY- likely not eating so tired, will monitor closely.   4/15- BGs better controlled- con't meds  10. CKD stage III. Follow-up chemistries  4/2- Cr stable at 1.24- recheck Monday  4/5- Cr up to 1.61- could be due to being dry- will recheck in AM after 1 unit pRBCs  4/6- Cr down to 1.48- has improved- con't to push fluids  4/8- Cr 1.59- over last 2 weeks, running 1.2-1.6- does have CKD Stage III- but could be IV ABX- will give IVFs low dose x 24 hours- 60cc NS  4/9- Cr down to 1.49 today after IVFs- now off IVFs- con't regimen and recheck Monday  4/13- Cr 1.93- will give blood today and likely IVFs AFTER blood.  4/14- Cr up to 2.33- likely due to urinary retention- Foley placed today- called IM early and they made multiples recs, which were followed out- Xarelto stopped- Heparin gtt started   4/15- Cr up to 2.93- called renal consult to help- also an issue due to urinary retention- placed foley yesterday 11. 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 Wound care left foot as directed. Follow-up per Dr. Sharol Given  12. Acute on chronic anemia. 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 Patient did have assistance at home. Has "seeing eye dog" and husband  15. Constipation. Continue Senokot. MiraLAX as needed. No nausea vomiting   4/2- 4 BMs yesterday-will stop Colace BID and con't senokot BID  4/4 seems to be moving bowels daily  now  4/5- 4 BMs yesterday- will reduce Senokot to 1 tab/day  4/8- says had 5 BM overnight- had 1 documented- will have her refuse senokot if she wants to, and monitor-  4/12- Pt insisting having man BMs/diarrhea- not documented- will talk with her.    4/13- d/c imodium and start Metamucil per pt request- is appropriate- will also check Hemoccult- since dropping Hb.,   4/14- LBM 2 days ago- nursing to work on.  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 Fall overnight with new confusion  4/6- getting head CT and monitor- strength unchanged- appears in pain and tremors slightly worse- no other changes  4/7- MRI of brain negative for anything acute- still confused per staff- will make baclofen prn; also could be from IV ABX per pharmacist- will monitor closely.   4/8- cognition is better today- MRI (-)  17. L heel wound, unstageable.   4/9- will consult wound care and get healing shoe to take weight off heel and wound care suggestions.  18. Pruritis: Added hydroxyzine 71m TID prn 19. Thrombocytopenia  4/13- down to 109 from 180- will recheck in AM- Needs Xarelto, so if decreases more, will call IM about plan.   4/14- as above.   4/15- Plts up to 127k- will monitor- on Heparin gtt  I spent a total of 40 minutes on care- ordered MRIs, called renal and ID consults and went back to tell pt plan and called husband.     LOS: 14 days A FACE TO FACE EVALUATION WAS PERFORMED  Mendel Binsfeld 02/12/2020, 10:52 AM

## 2020-02-12 NOTE — Progress Notes (Signed)
PROGRESS NOTE  [name] [name] RUE:[id number] DOB: 1955-12-14   PCP: Ann Held, DO  DOA: 01/29/2020 LOS: 75  Brief Narrative / Interim history: 64 year old female with history of PTSD, HTN, fibromyalgia, DM-2, cirrhosis, CKD-3, recent hospitalization for L1 osteomyelitis/epidural abscess with Pseudomonas discharged to CIR with a plan to complete IV cefepime through 5/7.  Hospitalist service consulted on 4/14 for AKI, thrombocytopenia, acute urinary retention and anemia.  AKI continued to get worse despite IV fluid and Foley catheter placement. Renal ultrasound without significant finding other than bladder distention.  She is not on any nephrotoxic agents and was on cefepime which could rarely cause AKI.  CT abdomen and pelvis without contrast with new mild T1 superior endplate compression fracture, mild lumbar paraspinal inflammation with no discrete postoperative hematoma and distended bladder but no acute finding. Nephrology consulted.  MRI of thoracic and lumbar spine ordered.   Subjective: Seen and examined earlier this morning.  No major events overnight of this morning.  Continues to complain lower back pain radiating across left flank.  Back pain has gotten worse over the last 3 days.  She also reports numbness and tingling in the left lower extremity all the way down to her left foot.  No significant change in lower extremity strength.  She denies hematuria or dysuria.  Denies history of kidney stone.  Denies bowel issues.  Objective: Vitals:   02/11/20 1245 02/11/20 1408 02/11/20 2033 02/12/20 0523  BP:  (!) 154/69 134/64 (!) 130/105  Pulse:  77 78 78  Resp:  16 18 18   Temp:  98.2 F (36.8 C) 97.9 F (36.6 C) 98.2 F (36.8 C)  TempSrc:  Oral  Oral  SpO2:  96% 100% 98%  Weight: 81.7 kg       Intake/Output Summary (Last 24 hours) at 02/12/2020 1300 Last data filed at 02/12/2020 0956 Gross per 24 hour  Intake 939.82 ml  Output 2300 ml  Net -1360.18 ml   Filed  Weights   02/11/20 1245  Weight: 81.7 kg    Examination:  GENERAL: No apparent distress. Nontoxic.  HEENT: MMM.  Vision and hearing grossly intact.  NECK: Supple.  No apparent JVD.  RESP: On room air.  No IWOB. Good air movement bilaterally. CVS:  RRR. Heart sounds normal.  ABD/GI/GU: BS present. Soft. Non tender.  Clear looking urine in Foley bag. MSK/EXT:  Moves extremities.  Tenderness mainly over surgical sites in her lower back.  No apparent erythema or signs of infection.  No drainage. SKIN: Chronic wound/ulcer over left heel.  No surrounding skin erythema NEURO: Awake, alert and oriented appropriately.  Motor 4/5 in BLE.  Light sensation grossly intact.  Patellar reflex symmetric. PSYCH: Calm. Normal affect.   Procedures:  None  Microbiology summarized: None  Assessment & Plan: AKI on CKD-3A with mild azotemia: History of renal transplant?  Baseline Cr 1.3-1.5>> 1.93>> 2.95.  Not on nephrotoxic meds other than cefepime which could potentially contribute but rare.  Concern about obstructive etiology given acute urinary retention but no hydronephrosis on renal ultrasound. -Continue indwelling Foley catheter -Obtain FeNa -Nephrology consult  Acute on chronic back pain with left-sided radiculopathy in patient with history of L1 osteomyelitis/epidural abscess status post instrumentation.  Patient underwent anterolateral corpectomy with PSIF for osteomyelitis on 01/22/2020 by neurosurgery and discharged to CIR on 4/1 on cefepime 5/7.  CT abdomen and pelvis without contrast without new significant finding.  Neuro exam reassuring but needs to exclude cauda equina given urinary tension, elevated  CRP and ESR. -Agree with MRI of thoracic and lumbar spine ordered by primary. -Continue PT/OT  Acute urinary retention: Could be due to medications with anticholinergic side effects such as opiates, diazepam, Atarax... Obviously need to exclude cauda equina given history of epidural abscess and  osteo-. -Continue indwelling Foley catheter -MRI thoracic and lumbar spine as above. -Minimize opiates and other medication with anticholinergic side effect if possible -Start low-dose bethanechol.  Normocytic anemia: Baseline Hgb 11-13 before previous admission> 7.3 on discharge on 4/1> 6.7 on 4/5>1u> 8.1> 6.8 on 4/13>1u> 7.7> 8.1.  Could be due to renal disease, cirrhosis or hemolysis. -Check anemia panel, LDH and haptoglobin -May need ESA and IV iron -Check Hemoccult  Thrombocytopenia: Dipped at 109 on 4/13>> 127.  Due to liver cirrhosis? -Continue monitoring  Uncontrolled DM-2 with CKD-3A- most recent A1c 12.5%. Recent Labs    02/11/20 2129 02/12/20 0611 02/12/20 1151  GLUCAP 166* 77 141*  -Continue current regimen and statin.    Recent bilateral lower extremity DVT-diagnosed on 4/2.  She has been on Xarelto. -Agree with IV heparin  Liver cirrhosis: Etiology?  Hepatitis B nonimmune. -Check acute hepatitis panel and HIV  Chronic left heel ulcer: No signs of infection -Appreciate wound care  OSA? -Would benefit from outpatient sleep study  Anxiety/depression/insomnia -On Cymbalta, trazodone, Valium and Atarax.        DVT prophylaxis: On heparin drip for DVT Code Status: Full code Family Communication: No family at bedside.  Final disposition: Per primary  Consultants:  We are   Sch Meds:  Scheduled Meds: . sodium chloride   Intravenous Once  . atorvastatin  10 mg Oral Daily  . Chlorhexidine Gluconate Cloth  6 each Topical Daily  . DULoxetine  60 mg Oral Daily  . feeding supplement (GLUCERNA SHAKE)  237 mL Oral TID BM  . insulin aspart  0-9 Units Subcutaneous TID WC  . insulin glargine  35 Units Subcutaneous BID  . lidocaine  1 patch Transdermal Q24H  . multivitamin with minerals  1 tablet Oral Daily  . pregabalin  150 mg Oral QHS  . psyllium  1 packet Oral Daily  . senna  1 tablet Oral Daily  . sodium chloride flush  10-40 mL Intracatheter Q12H  .  traZODone  50 mg Oral QHS   Continuous Infusions: . ceFEPime (MAXIPIME) IV Stopped (02/12/20 0606)  . heparin 950 Units/hr (02/12/20 8889)  . sodium chloride 0.9 % 75 mL/hr (02/12/20 1235)   PRN Meds:.acetaminophen, diazepam, hydrOXYzine, ondansetron **OR** ondansetron (ZOFRAN) IV, oxyCODONE, oxyCODONE, polyethylene glycol, sodium chloride flush, sorbitol  Antimicrobials: Anti-infectives (From admission, onward)   Start     Dose/Rate Route Frequency Ordered Stop   02/12/20 0600  ceFEPIme (MAXIPIME) 2 g in sodium chloride 0.9 % 100 mL IVPB     2 g 200 mL/hr over 30 Minutes Intravenous Every 24 hours 02/11/20 1255 03/05/20 2359   02/10/20 0000  ceFEPime (MAXIPIME) IVPB     2 g Intravenous Every 12 hours 02/10/20 1450 03/12/20 2359   01/29/20 2000  ceFEPIme (MAXIPIME) 2 g in sodium chloride 0.9 % 100 mL IVPB  Status:  Discontinued     2 g 200 mL/hr over 30 Minutes Intravenous Every 12 hours 01/29/20 1704 02/11/20 1255       I have personally reviewed the following labs and images: CBC: Recent Labs  Lab 02/07/20 0852 02/10/20 0400 02/11/20 0435 02/12/20 0458  WBC 6.4 6.3 6.1 6.3  NEUTROABS  --  4.3 3.8 4.2  HGB 8.0* 6.8* 7.7* 8.1*  HCT 27.1* 23.3* 25.5* 27.1*  MCV 87.1 87.3 87.9 89.1  PLT 148* 109* 115* 127*   BMP &GFR Recent Labs  Lab 02/06/20 0410 02/10/20 0400 02/11/20 0435 02/12/20 0458  NA 139 136 136 137  K 3.9 4.6 4.3 4.4  CL 105 105 106 107  CO2 26 24 23 22   GLUCOSE 165* 164* 134* 109*  BUN 18 27* 34* 37*  CREATININE 1.49* 1.93* 2.33* 2.95*  CALCIUM 8.1* 8.7* 8.5* 8.7*   Estimated Creatinine Clearance (by C-G formula based on SCr of 2.95 mg/dL (H)) Female: 18.7 mL/min (A) Female: 22.9 mL/min (A) Liver & Pancreas: Recent Labs  Lab 02/12/20 0458  AST 17  ALT 12  ALKPHOS 101  BILITOT 0.6  PROT 6.6  ALBUMIN 1.8*   No results for input(s): LIPASE, AMYLASE in the last 168 hours. No results for input(s): AMMONIA in the last 168 hours. Diabetic: No  results for input(s): HGBA1C in the last 72 hours. Recent Labs  Lab 02/11/20 1736 02/11/20 1817 02/11/20 2129 02/12/20 0611 02/12/20 1151  GLUCAP 66* 106* 166* 77 141*   Cardiac Enzymes: No results for input(s): CKTOTAL, CKMB, CKMBINDEX, TROPONINI in the last 168 hours. No results for input(s): PROBNP in the last 8760 hours. Coagulation Profile: No results for input(s): INR, PROTIME in the last 168 hours. Thyroid Function Tests: No results for input(s): TSH, T4TOTAL, FREET4, T3FREE, THYROIDAB in the last 72 hours. Lipid Profile: No results for input(s): CHOL, HDL, LDLCALC, TRIG, CHOLHDL, LDLDIRECT in the last 72 hours. Anemia Panel: No results for input(s): VITAMINB12, FOLATE, FERRITIN, TIBC, IRON, RETICCTPCT in the last 72 hours. Urine analysis:    Component Value Date/Time   COLORURINE YELLOW 01/21/2020 0655   APPEARANCEUR TURBID (A) 01/21/2020 0655   LABSPEC 1.016 01/21/2020 0655   PHURINE 5.0 01/21/2020 0655   GLUCOSEU 150 (A) 01/21/2020 0655   GLUCOSEU 500 (A) 12/03/2019 1309   HGBUR MODERATE (A) 01/21/2020 0655   HGBUR moderate 08/05/2010 1048   BILIRUBINUR NEGATIVE 01/21/2020 0655   BILIRUBINUR neg 11/15/2017 1241   KETONESUR NEGATIVE 01/21/2020 0655   PROTEINUR 100 (A) 01/21/2020 0655   UROBILINOGEN >=8.0 (A) 12/03/2019 1309   NITRITE NEGATIVE 01/21/2020 0655   LEUKOCYTESUR LARGE (A) 01/21/2020 0655   Sepsis Labs: Invalid input(s): PROCALCITONIN, [place]  Microbiology: No results found for this or any previous visit (from the past 240 hour(s)).  Radiology Studies: CT ABDOMEN PELVIS WO CONTRAST  Result Date: 02/11/2020 CLINICAL DATA:  64 year old female with pathologic L1 compression fracture last month with abnormal adjacent T12 and L2 segments, treated with operative corpectomy and posterior fusion, with cultures positive for Pseudomonas. Low back pain, query improvement/hematoma. EXAM: CT ABDOMEN AND PELVIS WITHOUT CONTRAST TECHNIQUE: Multidetector CT  imaging of the abdomen and pelvis was performed following the standard protocol without IV contrast. COMPARISON:  Preoperative thoracic and lumbar MRI 01/20/2020 and CT Abdomen and Pelvis that day. FINDINGS: Lower chest: Increased small bilateral pleural effusions with mild lung base atelectasis. No pericardial effusion. Calcified coronary artery atherosclerosis and/or stents. Hepatobiliary: Nodular liver contour suggesting cirrhosis (series 3, image 28). Surgically absent gallbladder. Scattered small calcified granulomas in the liver with no other discrete liver lesion in the absence of IV contrast. Pancreas: Negative. Spleen: Stable mild splenomegaly. Occasional calcified splenic granulomas. Adrenals/Urinary Tract: Normal adrenal glands. Noncontrast kidneys appear stable with no hydronephrosis or hydroureter. The urinary bladder is distended, estimated bladder volume 642 mL. No perivesical stranding. No urinary calculus identified. Stomach/Bowel: No dilated  large or small bowel. Redundant large bowel with retained stool throughout, although not significantly changed from last month. Normal retrocecal appendix on series 3, image 73. Negative stomach. No free air. No free fluid. Vascular/Lymphatic: Vascular patency is not evaluated in the absence of IV contrast. Aortoiliac calcified atherosclerosis. Stable mild reactive appearing retroperitoneal lymph nodes. Reproductive: Surgically absent uterus and diminutive or absent ovaries. Other: No pelvic free fluid. Mild generalized increased flank subcutaneous edema. Musculoskeletal: Sequelae of L1 corpectomy and posterior fusion hardware T11 through L3. Mild erosion of the adjacent T12 and L2 endplates appears stable from the preoperative MRI. No adverse hardware features. There is mild paraspinal soft tissue stranding, and some left psoas muscle gas at the corpectomy level. No discrete postoperative hematoma. But there is new T11 superior endplate compression on series  6, image 13. The T11 pedicle screws do closely approximate the superior endplate. Underlying osteopenia. No other new osseous abnormality. IMPRESSION: 1. Sequelae of L1 corpectomy and posterior fusion with hardware T11 through L3. No hardware loosening identified, but there is a new mild T11 superior endplate compression fracture. Mild erosion of the T12 and L2 endplates appears stable from the preoperative MRI. Mild lumbar paraspinal inflammation with no discrete postoperative hematoma. 2. Increased small bilateral pleural effusions with mild lung base atelectasis. 3. Cirrhosis with mild splenomegaly. 4. Distended urinary bladder, estimated at 642 mL. 5. Aortic Atherosclerosis (ICD10-I70.0). Electronically Signed   By: Genevie Ann M.D.   On: 02/11/2020 13:56   US RENAL  Result Date: 02/11/2020 CLINICAL DATA:  Urinary retention EXAM: RENAL / URINARY TRACT ULTRASOUND COMPLETE COMPARISON:  CT 02/11/2020, ultrasound 04/16/2019 FINDINGS: Right Kidney: Renal measurements: 10.6 x 5.1 x 4.8 cm = volume: 135 mL . Echogenicity within normal limits. No mass or hydronephrosis visualized. Left Kidney: Renal measurements: 11.0 x 4.8 x 4.8 cm = volume: 132 mL. Echogenicity within normal limits. No mass or hydronephrosis visualized. Bladder: Moderately distended, but appears otherwise unremarkable. Other: Heterogeneous appearance of the visualized portion of the liver, compatible with known cirrhosis. IMPRESSION: 1. Moderately distended urinary bladder. 2. Unremarkable kidneys without hydronephrosis. 3. Partially visualized cirrhotic liver. Electronically Signed   By: Davina Poke D.O.   On: 02/11/2020 14:20      [name] [name] T. [place]  If 7PM-7AM, please contact night-coverage [email or web address] Password [email or web address] 02/12/2020, 1:00 PM

## 2020-02-12 NOTE — Progress Notes (Signed)
Physical Therapy Session Note  Patient Details  Name: [name] [name] MRN: [id number] Date of Birth: 11/02/1955  Today's Date: 02/12/2020 PT Individual Time: 1020-1115 PT Individual Time Calculation (min): 55 min   Short Term Goals: Week 2:  PT Short Term Goal 1 (Week 2): STG = LTG due to ELOS.  Skilled Therapeutic Interventions/Progress Updates:   PT spoke with MD, who cleared pt for limited mobility this AM. Pt received supine in bed and agreeable to PT. Rolling R and L to don TLSO with min assist and cues for maintenance of back precautions, heavy use of bed features.  Supine>sit transfer with min assist at trunk and cues for safety and proper use of bed rail. PT donned pt shoes while sitting EOB with supervision assist. Noted to have myoclonic jerks sporadically in BLE.   Stand pivot transfer to [facility] with CGA-min assist for safety and BUE support on RW. Pt declined to attempt WC mobility due to pain in the RUE with exertion. Pt transported to   Standing balance/tolerance instructed by PT while engaged in dynavision program A, 3 rings. Performed x 2 with min assist throughout from PT for balance and AAROM at shoulder to reach targets above chest level. Pt able to tolerate 45 sec and 1 min standing respectively, then requiring rest break from BLE fatigue and mild knee instability.   Patient returned to room and left sitting in [facility] with call bell in reach and all needs met.          Therapy Documentation Precautions:  Precautions Precautions: Back, Fall, Other (comment)(has had issues w/orthostatic hypotension) Precaution Comments: Pt instructed in back precautions, and to self-monitor for activity tolerance, Required Braces or Orthoses: Spinal Brace Spinal Brace: Thoracolumbosacral orthotic, Applied in supine position Restrictions Weight Bearing Restrictions: No Pain: Pain Assessment Pain Scale: 0-10 Pain Score: 10-Worst pain ever Pain Type: Chronic pain Pain Location:  Back Pain Orientation: Right;Left Pain Descriptors / Indicators: Aching;Discomfort Pain Onset: On-going Patients Stated Pain Goal: 3 Pain Intervention(s): Medication (See eMAR)    Therapy/Group: Individual Therapy  Lorie Phenix 02/12/2020, 11:18 AM

## 2020-02-12 NOTE — Progress Notes (Signed)
ANTICOAGULATION CONSULT NOTE - Follow Up Consult  Pharmacy Consult for Xarelto>>IV heparin Indication: DVT  Allergies  Allergen Reactions  . Bee Pollen Anaphylaxis  . Fish-Derived Products Hives, Shortness Of Breath, Swelling and Rash    Hives get in throat causing trouble breathing  . Mushroom Extract Complex Anaphylaxis  . Penicillins Anaphylaxis    **Tolerated cefepime March 2021 Did it involve swelling of the face/tongue/throat, SOB, or low BP? Yes Did it involve sudden or severe rash/hives, skin peeling, or any reaction on the inside of your mouth or nose? No Did you need to seek medical attention at a hospital or doctor's office? Yes When did it last happen?A few months ago If all above answers are "NO", may proceed with cephalosporin use.  [name] Kitchen Rosemary Oil Anaphylaxis  . Shellfish Allergy Hives, Shortness Of Breath, Swelling and Rash  . Tomato Hives and Shortness Of Breath    Hives in throat causes her trouble breathing  . Acetaminophen Other (See Comments)    GI upset  . Acyclovir And Related Other (See Comments)    Unknown rxn  . Aloe Vera Hives  . Broccoli [Brassica Oleracea] Hives  . Naproxen Other (See Comments)    Unknown rxn    Patient Measurements: Wt: 81.7 kg   Vital Signs: Temp: 98.2 F (36.8 C) (04/15 0523) Temp Source: Oral (04/15 0523) BP: 130/105 (04/15 0523) Pulse Rate: 78 (04/15 0523)  Labs: Recent Labs    02/10/20 0400 02/10/20 0400 02/11/20 0435 02/11/20 1534 02/12/20 0458  HGB 6.8*   < > 7.7*  --  8.1*  HCT 23.3*  --  25.5*  --  27.1*  PLT 109*  --  115*  --  127*  APTT  --   --   --  62* 75*  HEPARINUNFRC  --   --   --  >2.20*  --   CREATININE 1.93*  --  2.33*  --  2.95*   < > = values in this interval not displayed.    Estimated Creatinine Clearance (by C-G formula based on SCr of 2.95 mg/dL (H)) Female: 18.7 mL/min (A) Female: 22.9 mL/min (A)    Assessment: 58 YOF with new bilateral DVTs. Pharmacy consulted to start Xarelto.  Of note, patient fell recently but CT head yesterday was negative.   Her scr has worsened over the past few days and it's not at 2.33 (CrCl ~47m/min). Xarelto is not recommended in this situation. We are going to switch her to IV heparin instead of lovenox due to her low baseline hgb after discussion with Dr. RRanell Patrick We will monitor with PTT for now due to her Xarelto affecting the heparin levels.  PTT 75 sec (therapeutic) on gtt at 950 units/hr. No bleeding noted.   Goal of Therapy:  Heparin level 0.3-0.7 units/ml APTT 66-102 sec Monitor platelets by anticoagulation protocol: Yes   Plan:  Heparin 950 units/hr  Will f/u 6hr PTT to verify therapeutic  CSherlon Handing PharmD, BCPS Please see amion for complete clinical pharmacist phone list 02/12/2020 5:41 AM

## 2020-02-12 NOTE — Progress Notes (Addendum)
Social Work [name], adult   DOB: [birth year], 64 y.o.   MRN: [id number]   Per medical team, pt will not d/c tomorrow. D/c date pending medical concerns.   SW spoke with [facility] [phone]) to inform on above about d/c. No copay for IV abx. Waiting to secure [facility] services for PT/OT.   SW received call from [facility] reporting that pt will be accepted for HHPT/OT.   SW left message for [facility] informing on above.  SW met with pt in room to discuss above. DME: w/c delivered to pt room by [place]. D/c instructions updated.   Loralee Pacas, MSW, [place] Office: [phone] Cell: [phone] Fax: [phone]

## 2020-02-12 NOTE — Consult Note (Signed)
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 as indicated. Patient historically has not tolerated oral iron.   5. Cirrhosis - HIV Ab/Hep panel pending.  - Per primary team.   6. Uncontrolled DMT2 - Most recent HbA1c 12.5% - Continue current regimen per primary team  7. Recent lower extremity DVT - On heparin. Continue per primary team.    Marisa Sprinkles, PA-S2 [facility]

## 2020-02-12 NOTE — Discharge Instructions (Signed)
Inpatient Rehab Discharge Instructions  [place] Discharge date and time: No discharge date for patient encounter.   Activities/Precautions/ Functional Status: Activity: Back brace when out of bed Diet: diabetic diet Wound Care: keep wound clean and dry Functional status:  ___ No restrictions     ___ Walk up steps independently ___ 24/7 supervision/assistance   ___ Walk up steps with assistance ___ Intermittent supervision/assistance  ___ Bathe/dress independently ___ Walk with walker     _x__ Bathe/dress with assistance ___ Walk Independently    ___ Shower independently ___ Walk with assistance    ___ Shower with assistance ___ No alcohol     ___ Return to work/school ________   COMMUNITY REFERRALS UPON DISCHARGE:    [facility] for IV antibiotics (Cefepime until 03/05/2020) [phone]   Home Health: [facility] for skilled nursing (IV abx and PICC care) [phone]   Home Health:   PT     OT                Agency: [facility] Phone: [phone]    Medical Equipment/Items Ordered: wheelchair                                                 Agency/Supplier: [facility] [phone]    Special Instructions:  Continue intravenous cefepime 2 g every 12 hours through 03/05/2020 and stop per infectious disease Dr. Michel Bickers  Wash left heel with soap and water.  Pat dry wrapped in approximately one half roll of Kerlix.  Perform daily  My questions have been answered and I understand these instructions. I will adhere to these goals and the provided educational materials after my discharge from the hospital.  Patient/Caregiver Signature _______________________________ Date __________  Clinician Signature _______________________________________ Date __________  Please bring this form and your medication list with you to all your follow-up doctor's appointments.  Information on my medicine - XARELTO  (rivaroxaban)  This medication education was reviewed with me or my healthcare representative as part of my discharge preparation.  The pharmacist that spoke with me during my hospital stay was:  Yujing Z  [place],, [place]? Xarelto was prescribed to treat blood clots that may have been found in the veins of your legs (deep vein thrombosis) or in your lungs (pulmonary embolism) and to reduce the risk of them occurring again.  What do you need to know about Xarelto? The starting dose is one 15 mg tablet taken TWICE daily with food for the FIRST 21 DAYS then on (enter date) February 21, 2020 the dose is changed to one 20 mg tablet taken ONCE A DAY with your evening meal.  DO NOT stop taking Xarelto without talking to the health care provider who prescribed the medication.  Refill your prescription for 20 mg tablets before you run out.  After discharge, you should have regular check-up appointments with your healthcare provider that is prescribing your Xarelto.  In the future your dose may need to be changed if your kidney function changes by a significant amount.  What do you do if you miss a dose? If you are taking Xarelto TWICE DAILY and you miss a dose, take it as soon as you remember. You may take two 15 mg tablets (total 30 mg) at the same time then resume your  regularly scheduled 15 mg twice daily the next day.  If you are taking Xarelto ONCE DAILY and you miss a dose, take it as soon as you remember on the same day then continue your regularly scheduled once daily regimen the next day. Do not take two doses of Xarelto at the same time.   Important Safety Information Xarelto is a blood thinner medicine that can cause bleeding. You should call your healthcare provider right away if you experience any of the following: ? Bleeding from an injury or your nose that does not stop. ? Unusual colored urine (red or dark brown) or unusual colored stools (red or  black). ? Unusual bruising for unknown reasons. ? A serious fall or if you hit your head (even if there is no bleeding).  Some medicines may interact with Xarelto and might increase your risk of bleeding while on Xarelto. To help avoid this, consult your healthcare provider or pharmacist prior to using any new prescription or non-prescription medications, including herbals, vitamins, non-steroidal anti-inflammatory drugs (NSAIDs) and supplements.  This website has more information on Xarelto: [email or web address]

## 2020-02-12 NOTE — Plan of Care (Signed)
  Problem: Consults Goal: RH SPINAL CORD INJURY PATIENT EDUCATION Description:  See Patient Education module for education specifics.  Outcome: Progressing Goal: Skin Care Protocol Initiated - if Braden Score 18 or less Description: If consults are not indicated, leave blank or document N/A Outcome: Progressing Goal: Nutrition Consult-if indicated Outcome: Progressing Goal: Diabetes Guidelines if Diabetic/Glucose > 140 Description: If diabetic or lab glucose is > 140 mg/dl - Initiate Diabetes/Hyperglycemia Guidelines & Document Interventions  Outcome: Progressing   Problem: RH SKIN INTEGRITY Goal: RH STG SKIN FREE OF INFECTION/BREAKDOWN Description: Patients skin will remain free from further breakdown or infection with mod I assist. Outcome: Progressing Goal: RH STG MAINTAIN SKIN INTEGRITY WITH ASSISTANCE Description: STG Maintain Skin Integrity With min Assistance. Outcome: Progressing Goal: RH STG ABLE TO PERFORM INCISION/WOUND CARE W/ASSISTANCE Description: STG Able To Perform Incision/Wound Care With min Assistance. Outcome: Progressing   Problem: RH SAFETY Goal: RH STG ADHERE TO SAFETY PRECAUTIONS W/ASSISTANCE/DEVICE Description: STG Adhere to Safety Precautions With min Assistance/Device. Outcome: Progressing Goal: RH STG DECREASED RISK OF FALL WITH ASSISTANCE Description: STG Decreased Risk of Fall With min Assistance. Outcome: Progressing   Problem: RH PAIN MANAGEMENT Goal: RH STG PAIN MANAGED AT OR BELOW PT'S PAIN GOAL Description: Pain < 5 Outcome: Progressing

## 2020-02-12 NOTE — Progress Notes (Signed)
ANTICOAGULATION CONSULT NOTE - Follow Up Consult  Pharmacy Consult for Xarelto>>IV heparin Indication: DVT  Allergies  Allergen Reactions  . Bee Pollen Anaphylaxis  . Fish-Derived Products Hives, Shortness Of Breath, Swelling and Rash    Hives get in throat causing trouble breathing  . Mushroom Extract Complex Anaphylaxis  . Penicillins Anaphylaxis    **Tolerated cefepime March 2021 Did it involve swelling of the face/tongue/throat, SOB, or low BP? Yes Did it involve sudden or severe rash/hives, skin peeling, or any reaction on the inside of your mouth or nose? No Did you need to seek medical attention at a hospital or doctor's office? Yes When did it last happen?A few months ago If all above answers are "NO", may proceed with cephalosporin use.  [name] Kitchen Rosemary Oil Anaphylaxis  . Shellfish Allergy Hives, Shortness Of Breath, Swelling and Rash  . Tomato Hives and Shortness Of Breath    Hives in throat causes her trouble breathing  . Acetaminophen Other (See Comments)    GI upset  . Acyclovir And Related Other (See Comments)    Unknown rxn  . Aloe Vera Hives  . Broccoli [Brassica Oleracea] Hives  . Naproxen Other (See Comments)    Unknown rxn    Patient Measurements: Wt: 81.7 kg   Vital Signs: Temp: 98.2 F (36.8 C) (04/15 0523) Temp Source: Oral (04/15 0523) BP: 130/105 (04/15 0523) Pulse Rate: 78 (04/15 0523)  Labs: Recent Labs    02/10/20 0400 02/10/20 0400 02/11/20 0435 02/11/20 1534 02/12/20 0458 02/12/20 1413  HGB 6.8*   < > 7.7*  --  8.1*  --   HCT 23.3*  --  25.5*  --  27.1*  --   PLT 109*  --  115*  --  127*  --   APTT  --   --   --  62* 75* 48*  HEPARINUNFRC  --   --   --  >2.20*  --   --   CREATININE 1.93*  --  2.33*  --  2.95*  --    < > = values in this interval not displayed.    Estimated Creatinine Clearance (by C-G formula based on SCr of 2.95 mg/dL (H)) Female: 18.7 mL/min (A) Female: 22.9 mL/min (A)    Assessment: 72 YOF with new  bilateral DVTs. Pharmacy consulted to start Xarelto. Of note, patient fell recently but CT head yesterday was negative.   Her scr has worsened over the past few days and it's not at 2.33 (CrCl ~80m/min). Xarelto is not recommended in this situation. We are going to switch her to IV heparin instead of lovenox due to her low baseline hgb after discussion with Dr. RRanell Patrick We will monitor with PTT for now due to her Xarelto affecting the heparin levels.  PTT came back subtherapeutic this PM. Rn said the heparin has been running fin. Her plt has trended down over the last 4 days and it's now at 127k. Calculated 4T to be low at 3.    Goal of Therapy:  Heparin level 0.3-0.7 units/ml APTT 66-102 sec Monitor platelets by anticoagulation protocol: Yes   Plan:  Increase heparin to 1100 units/hr  Check 8 hr PTT Daily HL and PTT  MOnnie Boer PharmD, BCIDP, AAHIVP, CPP Infectious Disease Pharmacist 02/12/2020 3:18 PM

## 2020-02-12 NOTE — Progress Notes (Signed)
Occupational Therapy Session Note  Patient Details  Name: [name] [name] MRN: [id number] Date of Birth: 17-Mar-1956  Today's Date: 02/12/2020 OT Individual Time: 5374-8270  &  1330-1430 OT Individual Time Calculation (min): 74 min   & 60 min   Short Term Goals: Week 2:  OT Short Term Goal 1 (Week 2): STG = LTGs due to remaining LOS  Skilled Therapeutic Interventions/Progress Updates:    AM session:  Patient states that she is extremely tired, pain level "8" in right arm, left hip/back.  new foley catheter.   Alert, oriented and aware of needs.  Sponge bath completed bed level with mod A for lower body, min A UB.  LB dressing max A this am due to need to thread catheter and fatigue,  Rolling in bed with CS.  Did not change shirt at this time due to IV running and nursing unavailable. Completed light UB AROM activities and theraputty with rest breaks,   Reviewed bed rails and shampoo (hair washing) tray - provided written information.   She remained in bed this session due to fatigue, adl tasks required increased time this session.  Bed alarm set and call bell in reach.  PM session:  Patient seated in w/c finishing lunch.   States that she is tired but willing to participate in therapy session.  Washed hair with shampoo tray - max A.  She is able to dry hair with mod A, brush hair with min A.  Sit to stand with CGA - she is able to maintain stance with CGA approx 1 minute each attempt.  Completed UB conditioning activities at slow rate.  She remained sitting in w/c at close of session as per her request, seat belt alarm set and call bell in reach.    Therapy Documentation Precautions:  Precautions Precautions: Back, Fall, Other (comment)(has had issues w/orthostatic hypotension) Precaution Comments: Pt instructed in back precautions, and to self-monitor for activity tolerance, Required Braces or Orthoses: Spinal Brace Spinal Brace: Thoracolumbosacral orthotic, Applied in supine  position Restrictions Weight Bearing Restrictions: No General:   Vital Signs: Therapy Vitals Temp: 98.2 F (36.8 C) Temp Source: Oral Pulse Rate: 78 Resp: 18 BP: (!) 130/105 Patient Position (if appropriate): Lying Oxygen Therapy SpO2: 98 % O2 Device: Room Air   Therapy/Group: Individual Therapy  Carlos Levering 02/12/2020, 7:39 AM

## 2020-02-13 ENCOUNTER — Inpatient Hospital Stay (HOSPITAL_COMMUNITY): Payer: PRIVATE HEALTH INSURANCE | Admitting: Physical Therapy

## 2020-02-13 ENCOUNTER — Inpatient Hospital Stay (HOSPITAL_COMMUNITY): Payer: PRIVATE HEALTH INSURANCE

## 2020-02-13 DIAGNOSIS — N179 Acute kidney failure, unspecified: Secondary | ICD-10-CM

## 2020-02-13 DIAGNOSIS — M4626 Osteomyelitis of vertebra, lumbar region: Secondary | ICD-10-CM

## 2020-02-13 DIAGNOSIS — Z981 Arthrodesis status: Secondary | ICD-10-CM

## 2020-02-13 DIAGNOSIS — Z86718 Personal history of other venous thrombosis and embolism: Secondary | ICD-10-CM

## 2020-02-13 DIAGNOSIS — D696 Thrombocytopenia, unspecified: Secondary | ICD-10-CM

## 2020-02-13 DIAGNOSIS — Z7901 Long term (current) use of anticoagulants: Secondary | ICD-10-CM

## 2020-02-13 DIAGNOSIS — M4625 Osteomyelitis of vertebra, thoracolumbar region: Secondary | ICD-10-CM | POA: Diagnosis not present

## 2020-02-13 DIAGNOSIS — M4646 Discitis, unspecified, lumbar region: Secondary | ICD-10-CM

## 2020-02-13 DIAGNOSIS — B965 Pseudomonas (aeruginosa) (mallei) (pseudomallei) as the cause of diseases classified elsewhere: Secondary | ICD-10-CM

## 2020-02-13 LAB — GLUCOSE, CAPILLARY
Glucose-Capillary: 137 mg/dL — ABNORMAL HIGH (ref 70–99)
Glucose-Capillary: 121 mg/dL — ABNORMAL HIGH (ref 70–99)
Glucose-Capillary: 136 mg/dL — ABNORMAL HIGH (ref 70–99)
Glucose-Capillary: 141 mg/dL — ABNORMAL HIGH (ref 70–99)

## 2020-02-13 LAB — URINALYSIS, ROUTINE W REFLEX MICROSCOPIC
Bilirubin Urine: NEGATIVE
Glucose, UA: NEGATIVE mg/dL
Ketones, ur: NEGATIVE mg/dL
Nitrite: NEGATIVE
Protein, ur: 30 mg/dL — AB
Specific Gravity, Urine: 1.008 (ref 1.005–1.030)
pH: 5 (ref 5.0–8.0)

## 2020-02-13 LAB — CBC WITH DIFFERENTIAL/PLATELET
Abs Immature Granulocytes: 0.05 10*3/uL (ref 0.00–0.07)
Basophils Absolute: 0 10*3/uL (ref 0.0–0.1)
Basophils Relative: 1 %
Eosinophils Absolute: 0.3 10*3/uL (ref 0.0–0.5)
Eosinophils Relative: 4 %
HCT: 25.3 % — ABNORMAL LOW (ref 36.0–46.0)
Hemoglobin: 7.5 g/dL — ABNORMAL LOW (ref 12.0–15.0)
Immature Granulocytes: 1 %
Lymphocytes Relative: 19 %
Lymphs Abs: 1.2 10*3/uL (ref 0.7–4.0)
MCH: 26.9 pg (ref 26.0–34.0)
MCHC: 29.6 g/dL — ABNORMAL LOW (ref 30.0–36.0)
MCV: 90.7 fL (ref 80.0–100.0)
Monocytes Absolute: 0.8 10*3/uL (ref 0.1–1.0)
Monocytes Relative: 13 %
Neutro Abs: 3.9 10*3/uL (ref 1.7–7.7)
Neutrophils Relative %: 62 %
Platelets: 121 10*3/uL — ABNORMAL LOW (ref 150–400)
RBC: 2.79 MIL/uL — ABNORMAL LOW (ref 3.87–5.11)
RDW: 16.5 % — ABNORMAL HIGH (ref 11.5–15.5)
WBC: 6.3 10*3/uL (ref 4.0–10.5)
nRBC: 0 % (ref 0.0–0.2)

## 2020-02-13 LAB — C-REACTIVE PROTEIN: CRP: 8.5 mg/dL — ABNORMAL HIGH (ref <1.0)

## 2020-02-13 LAB — LIPID PANEL
Cholesterol: 88 mg/dL (ref 0–200)
HDL: 21 mg/dL — ABNORMAL LOW (ref >40)
LDL Cholesterol: 45 mg/dL (ref 0–99)
Total CHOL/HDL Ratio: 4.2 RATIO
Triglycerides: 112 mg/dL (ref <150)
VLDL: 22 mg/dL (ref 0–40)

## 2020-02-13 LAB — BASIC METABOLIC PANEL
Anion gap: 8 (ref 5–15)
BUN: 40 mg/dL — ABNORMAL HIGH (ref 8–23)
CO2: 21 mmol/L — ABNORMAL LOW (ref 22–32)
Calcium: 8.7 mg/dL — ABNORMAL LOW (ref 8.9–10.3)
Chloride: 107 mmol/L (ref 98–111)
Creatinine, Ser: 3.36 mg/dL — ABNORMAL HIGH (ref 0.44–1.00)
GFR calc Af Amer: 16 mL/min — ABNORMAL LOW (ref >60)
GFR calc non Af Amer: 14 mL/min — ABNORMAL LOW (ref >60)
Glucose, Bld: 135 mg/dL — ABNORMAL HIGH (ref 70–99)
Potassium: 4.7 mmol/L (ref 3.5–5.1)
Sodium: 136 mmol/L (ref 135–145)

## 2020-02-13 LAB — SEDIMENTATION RATE: Sed Rate: 124 mm/hr — ABNORMAL HIGH (ref 0–22)

## 2020-02-13 LAB — APTT: aPTT: 85 seconds — ABNORMAL HIGH (ref 24–36)

## 2020-02-13 LAB — HEPARIN LEVEL (UNFRACTIONATED): Heparin Unfractionated: 1.38 IU/mL — ABNORMAL HIGH (ref 0.30–0.70)

## 2020-02-13 LAB — HAPTOGLOBIN: Haptoglobin: 203 mg/dL (ref 37–355)

## 2020-02-13 MED ORDER — CEFEPIME IV (FOR PTA / DISCHARGE USE ONLY): 2.0000 g | INTRAVENOUS | 0 refills | Status: DC

## 2020-02-13 NOTE — Progress Notes (Signed)
Recreational Therapy Session Note  Patient Details  Name: [name] [name] MRN: [id number] Date of Birth: 07/25/1956 Today's Date: 02/13/2020   Met with pt previously to initiate eval but have been unable to connect with pt since due to low activity tolerance, being off the floor for testing.  Will continue to monitor pt through team for TR intervention, and will complete eval/goals as appropriate.  [name] KitchenPort Monmouth 02/13/2020, 1:04 PM

## 2020-02-13 NOTE — Progress Notes (Signed)
Physical Therapy Session Note  Patient Details  Name: [name] [name] MRN: [id number] Date of Birth: 1955-11-07  Today's Date: 02/13/2020 PT Individual Time: 1660-6004 PT Individual Time Calculation (min): 54 min   Short Term Goals: Week 1:  PT Short Term Goal 1 (Week 1): STG=LTGs Week 2:  PT Short Term Goal 1 (Week 2): STG = LTG due to ELOS.  Skilled Therapeutic Interventions/Progress Updates:    Pt requires encouragement to participate in session as she states she feels like she "died and came back to life- kind of." Therapeutic use of self to gain rapport and encourage mobility in order to assess fit of TLSO (it has been taken apart by nursing and could not get it back together.) Pt agreeable. Min assist and extra time to roll each direction in order to don TLSO once PT adjusted it back together using bed rails for support and cues for use of BLE to assist. Mod assist for supine to sit with log roll technique requiring mod facilitation at trunk and to maintain balance initially at EOB as pt presenting with posterior bias and maintains LLE up in the air. Pt continues to demonstrate jerking movements and reports tremors in her hands if she does not hold onto something. Emotional support and encouragement provided throughout mobility. Once EOB pt able to maintain balance with fluctuating assist from close supervision to mod assist due to posterior lean ~ 8 min with encouragement for upright posture. Declined attempting transfer OOB at this time and request to lay back down due to reports of dizziness and fatigue. Unable to assess BP to determine if orthostasis was a factor. CGA to return to supine, pt managing BLE. Total assist to doff TLSO. Cues for redirection back to task as pt tangential and hyperverbal at times. Cues for bridging technique to assist with scooting up in the bed. Agreeable to bed level LE therex for functional strengthening and ROM due to declining OOB activities. Instructed in  supine SLR, SAQ, and heel slides x 10 reps each x 2 sets with pt difficulty keeping accurate count of activity and continues with jerking and uncoordinated movements in BLE at times. End of session set up with [facility] elevated and all needs in reach.   Therapy Documentation Precautions:  Precautions Precautions: Back, Fall, Other (comment)(has had issues w/orthostatic hypotension) Precaution Comments: Pt instructed in back precautions, and to self-monitor for activity tolerance, Required Braces or Orthoses: Spinal Brace Spinal Brace: Thoracolumbosacral orthotic, Applied in supine position Restrictions Weight Bearing Restrictions: No Pain:  Does rate pain but reports pain in back and generalized. Rest breaks and repositioned as able.   Therapy/Group: Individual Therapy  Canary Brim Ivory Broad, PT, DPT, CBIS  02/13/2020, 2:47 PM

## 2020-02-13 NOTE — Progress Notes (Signed)
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  In discussion with Dr. Lovorn, we felt MRI appropriate for follow up.  The patient reports a recent fall and neck pain and recently diagnosed with a DVT on 4/2 despite being on Xarelto.  Her main complaint is neck pain on the left side of her neck.   Her WBC is wnl and no fever.  ESR > 140 and 124, CRP 8.5, 8.9  Physical Exam: Constitutional:  Vitals:   02/12/20 2319 02/13/20 0520  BP:  135/65  Pulse: 79 84  Resp: 16 19  Temp:  98 F (36.7 C)  SpO2: 96% 94%   patient appears in NAD Respiratory: Normal respiratory effort; CTA B Cardiovascular: RRR GI: soft, nt, nd MS: neck with no spinous tenderness  Review of Systems: Constitutional: negative for fevers and chills Gastrointestinal: negative for nausea and diarrhea Integument/breast: negative for rash  Lab Results  Component Value Date   WBC 6.3 02/13/2020   HGB 7.5 (L) 02/13/2020   HCT 25.3 (L) 02/13/2020   MCV 90.7 02/13/2020   PLT 121 (L) 02/13/2020    Lab Results  Component Value Date   CREATININE 3.36 (H) 02/13/2020   BUN 40 (H) 02/13/2020   NA 136 02/13/2020   K 4.7 02/13/2020   CL 107 02/13/2020   CO2 21 (L) 02/13/2020    Lab Results  Component Value Date   ALT 12 02/12/2020   AST 17 02/12/2020   ALKPHOS 101 02/12/2020     Microbiology: No results found for this or any previous visit (from the past 240 hour(s)).  Impression/Plan:  1. Discitis - MRI in March with L1 vertebral body partial destruction and abnormal enhancement of the soft tissues with a positive cultures for Pseudomonas in her operative cultures.  Repeat MRI with really no visible evidence of discitis/osteomyelitis though somewhat obscured with motion.  Seems to be improving overall and clinically she does  not report any worsening.  Therefore no indication that her surgical area is related to her elevated inflammatory markers.   Continue with cefepime as planned through 5/7, then will transition to prolonged oral cipro.    2.  AKI - cefepime not known to be associated with nephrotoxicity.  Required foley placement.   3.  Thrombocytopenia - likely from underlying cirrhosis, has been intermittently low for a prolonged period.   4.  Elevated inflammatory markers - unknown etiology but does not appear to be related to #1.  Her left foot does not appear infected.  Possibly due to her recent DVT.  Her cervical pain is not spinal and on exam the spinous area is non-tender so not c/w infection there.   

## 2020-02-13 NOTE — Progress Notes (Signed)
PROGRESS NOTE  [name] [name] FFM:[id number] DOB: 1956-08-31   PCP: Ann Held, DO  DOA: 01/29/2020 LOS: 64  Brief Narrative / Interim history: 64 year old female with history of PTSD, HTN, fibromyalgia, DM-2, cirrhosis, CKD-3, recent hospitalization for L1 osteomyelitis/epidural abscess with Pseudomonas discharged to CIR with a plan to complete IV cefepime through 5/7.  Hospitalist service consulted on 4/14 for AKI, thrombocytopenia, acute urinary retention and anemia.  AKI continued to get worse despite IV fluid and Foley catheter placement. Renal ultrasound without significant finding other than bladder distention.  She is not on any nephrotoxic agents and was on cefepime which could rarely cause AKI.  CT abdomen and pelvis without contrast with new mild T1 superior endplate compression fracture, mild lumbar paraspinal inflammation with no discrete postoperative hematoma and distended bladder but no acute finding.  MRI of thoracic and lumbar spine without acute finding though somewhat obscured by motion.  Nephrology consulted and started patient on IV fluid.  Infectious disease consulted and recommended continuing cefepime as previously planned.   Subjective: Seen and examined earlier this morning.  No major events overnight or this morning.  Continues to complain 10/10 lower back pain.  Denies chest pain, dyspnea or GI symptoms.  MRI thoracic and lumbar spine without significant finding.  Renal function uptrending.  Objective: Vitals:   02/12/20 1705 02/12/20 1949 02/12/20 2319 02/13/20 0520  BP:  (!) 109/55  135/65  Pulse:  76 79 84  Resp:  18 16 19   Temp:  98.4 F (36.9 C)  98 F (36.7 C)  TempSrc:      SpO2:  99% 96% 94%  Weight: 81.7 kg   87.4 kg    Intake/Output Summary (Last 24 hours) at 02/13/2020 1411 Last data filed at 02/13/2020 0754 Gross per 24 hour  Intake 742 ml  Output 475 ml  Net 267 ml   Filed Weights   02/11/20 1245 02/12/20 1705 02/13/20 0520    Weight: 81.7 kg 81.7 kg 87.4 kg    Examination:  GENERAL: Appears to be in some distress from pain HEENT: MMM.  Vision and hearing grossly intact.  NECK: Supple.  No apparent JVD.  RESP: On room air.  No IWOB. Good air movement bilaterally. CVS:  RRR. Heart sounds normal.  ABD/GI/GU: Bowel sounds present. Soft. Non tender.  MSK/EXT:  Moves extremities.  Diffuse tenderness mainly over surgical sites in her lower back. SKIN: No apparent erythema or signs of infection over his back.  Left heel ulcer. NEURO: Awake, alert and oriented appropriately.  No apparent focal neuro deficit other than bilateral lower extremity weakness PSYCH: Appears to be in some distress from pain.  Procedures:  None  Microbiology summarized: None  Assessment & Plan: AKI on CKD-3A with mild azotemia: History of renal transplant?  Baseline Cr 1.3-1.5>> 1.93>> 2.95> 3.36. Concern about obstructive etiology given acute urinary retention but no hydronephrosis on renal ultrasound. 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  Patient underwent anterolateral corpectomy with PSIF for osteomyelitis on 01/22/2020 by neurosurgery and discharged to CIR on 4/1 on cefepime 5/7.  CRP and ESR elevated.  Imaging including CT A/P, MRI T/L without contrast nonrevealing.  No leukocytosis or fever. -ID recommends continuing IV cefepime as previously planned. -Pain management per primary -Continue PT/OT  Acute urinary retention: Could be due to medications with anticholinergic side effects such as opiates, diazepam,  Atarax... but difficult situation due to her uncontrolled pain.  MRI of thoracic and lumbar spine without acute significant finding. -Continue indwelling Foley catheter -Started low-dose bethanechol.  Normocytic anemia: Baseline Hgb 11-13 before  previous admission> 7.3 on discharge on 4/1> 6.7 on 4/5>1u> 8.1> 6.8 on 4/13>1u> 7.7> 8.1> 7.5.  Could be due to renal disease, cirrhosis, hemolysis or blood loss.  Patient is on IV heparin for DVT.  Anemia panel and LDH within normal. -Check Hemoccult -Continue monitoring  Thrombocytopenia: Dipped at 109 on 4/13>> 127> 121.  Due to liver cirrhosis?  Relatively stable. -Continue monitoring  Uncontrolled DM-2 with CKD-3A- most recent A1c 12.5%. Recent Labs    02/12/20 2009 02/13/20 0626 02/13/20 1133  GLUCAP 142* 137* 121*  -Continue current regimen and statin.    Recent bilateral lower extremity DVT-diagnosed on 4/2.  She has been on Xarelto. 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  She is nonimmune to hepatitis B  Chronic left heel ulcer: No signs of infection -Appreciate wound care  OSA? -Would benefit from outpatient sleep study  Anxiety/depression/insomnia-could be playing a role into his uncontrolled pain -On Cymbalta, trazodone, Valium and Atarax.        DVT prophylaxis: On heparin drip for DVT Code Status: Full code Family Communication: Updated patient's husband at bedside.  Final disposition: Per primary  Consultants:  TRH, nephrology and ID    Sch Meds:  Scheduled Meds: . sodium chloride   Intravenous Once  . atorvastatin  10 mg Oral Daily  . bethanechol  10 mg Oral TID  . Chlorhexidine Gluconate Cloth  6 each Topical Daily  . DULoxetine  60 mg Oral Daily  . feeding supplement (GLUCERNA SHAKE)  237 mL Oral TID BM  . insulin aspart  0-9 Units Subcutaneous TID WC  . insulin glargine  35 Units Subcutaneous BID  . lidocaine  1 patch Transdermal Q24H  . multivitamin with minerals  1 tablet Oral Daily  . pregabalin  150 mg Oral QHS  . psyllium  1 packet Oral Daily  . senna  1 tablet Oral Daily  . sodium chloride flush  10-40 mL Intracatheter Q12H  . traZODone  50 mg Oral QHS    Continuous Infusions: . ceFEPime (MAXIPIME) IV 2 g (02/13/20 0547)  . heparin 1,100 Units/hr (02/12/20 1901)   PRN Meds:.acetaminophen, diazepam, hydrOXYzine, ondansetron **OR** ondansetron (ZOFRAN) IV, oxyCODONE, oxyCODONE, polyethylene glycol, sodium chloride flush, sorbitol  Antimicrobials: Anti-infectives (From admission, onward)   Start     Dose/Rate Route Frequency Ordered Stop   02/12/20 0600  ceFEPIme (MAXIPIME) 2 g in sodium chloride 0.9 % 100 mL IVPB     2 g 200 mL/hr over 30 Minutes Intravenous Every 24 hours 02/11/20 1255 03/05/20 2359   02/10/20 0000  ceFEPime (MAXIPIME) IVPB     2 g Intravenous Every 12 hours 02/10/20 1450 03/12/20 2359   01/29/20 2000  ceFEPIme (MAXIPIME) 2 g in sodium chloride 0.9 % 100 mL IVPB  Status:  Discontinued     2 g 200 mL/hr over 30 Minutes Intravenous Every 12 hours 01/29/20 1704 02/11/20 1255       I have personally reviewed the following labs and images: CBC: Recent Labs  Lab 02/07/20 0852 02/10/20 0400 02/11/20 0435 02/12/20 0458 02/13/20 0417  WBC 6.4 6.3 6.1 6.3 6.3  NEUTROABS  --  4.3 3.8 4.2 3.9  HGB 8.0* 6.8* 7.7* 8.1* 7.5*  HCT 27.1* 23.3* 25.5* 27.1* 25.3*  MCV 87.1 87.3 87.9 89.1 90.7  PLT 148* 109* 115* 127* 121*   BMP &GFR Recent Labs  Lab 02/10/20 0400 02/11/20 0435 02/12/20 0458 02/13/20 0417  NA 136 136 137 136  K 4.6 4.3 4.4 4.7  CL 105 106 107 107  CO2 24 23 22  21*  GLUCOSE 164* 134* 109* 135*  BUN 27* 34* 37* 40*  CREATININE 1.93* 2.33* 2.95* 3.36*  CALCIUM 8.7* 8.5* 8.7* 8.7*   Estimated Creatinine Clearance (by C-G formula based on SCr of 3.36 mg/dL (H)) Female: 17 mL/min (A) Female: 20.8 mL/min (A) Liver & Pancreas: Recent Labs  Lab 02/12/20 0458  AST 17  ALT 12  ALKPHOS 101  BILITOT 0.6  PROT 6.6  ALBUMIN 1.8*   No results for input(s): LIPASE, AMYLASE in the last 168 hours. No results for input(s): AMMONIA in the last 168 hours. Diabetic: Recent Labs    02/12/20 1419  HGBA1C  6.6*   Recent Labs  Lab 02/12/20 1151 02/12/20 1717 02/12/20 2009 02/13/20 0626 02/13/20 1133  GLUCAP 141* 163* 142* 137* 121*   Cardiac Enzymes: No results for input(s): CKTOTAL, CKMB, CKMBINDEX, TROPONINI in the last 168 hours. No results for input(s): PROBNP in the last 8760 hours. Coagulation Profile: No results for input(s): INR, PROTIME in the last 168 hours. Thyroid Function Tests: No results for input(s): TSH, T4TOTAL, FREET4, T3FREE, THYROIDAB in the last 72 hours. Lipid Profile: Recent Labs    02/13/20 0417  CHOL 88  HDL 21*  LDLCALC 45  TRIG 112  CHOLHDL 4.2   Anemia Panel: Recent Labs    02/12/20 1413  VITAMINB12 231  FOLATE 9.9  FERRITIN 568*  TIBC 216*  IRON 35  RETICCTPCT 3.5*   Urine analysis:    Component Value Date/Time   COLORURINE YELLOW 01/21/2020 0655   APPEARANCEUR TURBID (A) 01/21/2020 0655   LABSPEC 1.016 01/21/2020 0655   PHURINE 5.0 01/21/2020 0655   GLUCOSEU 150 (A) 01/21/2020 0655   GLUCOSEU 500 (A) 12/03/2019 1309   HGBUR MODERATE (A) 01/21/2020 0655   HGBUR moderate 08/05/2010 1048   BILIRUBINUR NEGATIVE 01/21/2020 0655   BILIRUBINUR neg 11/15/2017 [place] 01/21/2020 0655   PROTEINUR 100 (A) 01/21/2020 0655   UROBILINOGEN >=8.0 (A) 12/03/2019 1309   NITRITE NEGATIVE 01/21/2020 0655   LEUKOCYTESUR LARGE (A) 01/21/2020 0655   Sepsis Labs: Invalid input(s): PROCALCITONIN, [place]  Microbiology: No results found for this or any previous visit (from the past 240 hour(s)).  Radiology Studies: MR THORACIC SPINE WO CONTRAST  Result Date: 02/12/2020 CLINICAL DATA:  Thoracic compression fracture. Osteomyelitis. EXAM: MRI THORACIC AND LUMBAR SPINE WITHOUT CONTRAST TECHNIQUE: Multiplanar and multiecho pulse sequences of the thoracic and lumbar spine were obtained without intravenous contrast. COMPARISON:  None. FINDINGS: MRI THORACIC SPINE FINDINGS Alignment:  Normal Vertebrae: Posterior instrumented spinal  fusion beginning at T11. Vertebrae are otherwise normal. Cord:  Normal Paraspinal and other soft tissues: Bilateral pleural effusions Disc levels: No spinal canal or neural foraminal stenosis. MRI LUMBAR SPINE FINDINGS Segmentation:  Standard. Alignment:  Physiologic. Vertebrae: Susceptibility artifact from spinal hardware obscures the T12-L2 levels and to a lesser extent the T11 and L3 levels. No visible bone marrow abnormality. Conus medullaris and cauda equina: Conus extends to the L1 level. Conus and cauda equina appear normal. Paraspinal and other soft tissues: There is edema within the posterior soft tissues. No abscess. Disc levels: Assessment is limited by susceptibility effects and mild motion. L1-2: No spinal canal stenosis. Neural foramina are  obscured. L2-3: Widely patent spinal. No neural foraminal stenosis. L3-4: No spinal canal stenosis. Mild left foraminal stenosis due to asymmetric disc bulge. L4-5: Right asymmetric disc bulge with narrowing of the right lateral recess. No neural foraminal stenosis. L5-S1: No spinal canal or neural foraminal stenosis. IMPRESSION: 1. Posterior instrumented spinal fusion at T11-L3 with corpectomy at L1. Associated susceptibility artifact that obscures the T12-L2 level and to a lesser extent T11-12 levels. Within that limitation, no visible evidence of discitis-osteomyelitis. 2. No thoracic or lumbar spinal canal stenosis. 3. Nonspecific edema within the posterior soft tissues. No abscess. 4. Bilateral pleural effusions. Electronically Signed   By: Ulyses Jarred M.D.   On: 02/12/2020 21:08   MR LUMBAR SPINE WO CONTRAST  Result Date: 02/12/2020 CLINICAL DATA:  Thoracic compression fracture. Osteomyelitis. EXAM: MRI THORACIC AND LUMBAR SPINE WITHOUT CONTRAST TECHNIQUE: Multiplanar and multiecho pulse sequences of the thoracic and lumbar spine were obtained without intravenous contrast. COMPARISON:  None. FINDINGS: MRI THORACIC SPINE FINDINGS Alignment:  Normal  Vertebrae: Posterior instrumented spinal fusion beginning at T11. Vertebrae are otherwise normal. Cord:  Normal Paraspinal and other soft tissues: Bilateral pleural effusions Disc levels: No spinal canal or neural foraminal stenosis. MRI LUMBAR SPINE FINDINGS Segmentation:  Standard. Alignment:  Physiologic. Vertebrae: Susceptibility artifact from spinal hardware obscures the T12-L2 levels and to a lesser extent the T11 and L3 levels. No visible bone marrow abnormality. Conus medullaris and cauda equina: Conus extends to the L1 level. Conus and cauda equina appear normal. Paraspinal and other soft tissues: There is edema within the posterior soft tissues. No abscess. Disc levels: Assessment is limited by susceptibility effects and mild motion. L1-2: No spinal canal stenosis. Neural foramina are obscured. L2-3: Widely patent spinal. No neural foraminal stenosis. L3-4: No spinal canal stenosis. Mild left foraminal stenosis due to asymmetric disc bulge. L4-5: Right asymmetric disc bulge with narrowing of the right lateral recess. No neural foraminal stenosis. L5-S1: No spinal canal or neural foraminal stenosis. IMPRESSION: 1. Posterior instrumented spinal fusion at T11-L3 with corpectomy at L1. Associated susceptibility artifact that obscures the T12-L2 level and to a lesser extent T11-12 levels. Within that limitation, no visible evidence of discitis-osteomyelitis. 2. No thoracic or lumbar spinal canal stenosis. 3. Nonspecific edema within the posterior soft tissues. No abscess. 4. Bilateral pleural effusions. Electronically Signed   By: Ulyses Jarred M.D.   On: 02/12/2020 21:08      Charnese Federici T. [place]  If 7PM-7AM, please contact night-coverage [email or web address] Password [facility] 02/13/2020, 2:11 PM

## 2020-02-13 NOTE — Progress Notes (Signed)
Physical Therapy Session Note  Patient Details  Name: [name] [name] MRN: [id number] Date of Birth: 06-Aug-1956  Today's Date: 02/13/2020 PT Individual Time: 1530-1545 PT Individual Time Calculation (min): 15 min   Short Term Goals: Week 1:  PT Short Term Goal 1 (Week 1): STG=LTGs  Skilled Therapeutic Interventions/Progress Updates:   Pt received supine in bed. PT encouraged OOB activity Pt declines, stating that she felt terrible. Pt noted to be attempting to open ice cream, provied by NT, without success. PT then assisted pt to open as well as aid to provide stability to BUE to improve coordination an success given continues myoclonic jerks. Pt was extremely tangential in treatment, reporting that someone had entered room earlier, requested $1000 dollars, and also continually referring to herself as an experimental cat.  Pt continues to decline any OOB activity and left in bed with all needs met.      Therapy Documentation Precautions:  Precautions Precautions: Back, Fall, Other (comment)(has had issues w/orthostatic hypotension) Precaution Comments: Pt instructed in back precautions, and to self-monitor for activity tolerance, Required Braces or Orthoses: Spinal Brace Spinal Brace: Thoracolumbosacral orthotic, Applied in supine position Restrictions Weight Bearing Restrictions: No General: PT Amount of Missed Time (min): 30 Minutes PT Missed Treatment Reason: Patient unwilling to participate Vital Signs: Therapy Vitals Temp: 97.9 F (36.6 C) Pulse Rate: 92 Resp: 16 BP: (!) 151/64 Patient Position (if appropriate): Lying Oxygen Therapy SpO2: 95 % O2 Device: Room Air Pain: Pain Assessment Pain Scale: 0-10 Pain Score: 9     Therapy/Group: Individual Therapy  Lorie Phenix 02/13/2020, 4:10 PM

## 2020-02-13 NOTE — Progress Notes (Signed)
Physical Therapy Session Note  Patient Details  Name: [name] [name] MRN: [id number] Date of Birth: 1956/09/24  Today's Date: 02/13/2020 PT Individual Time: 1005-1017 PT Individual Time Calculation (min): 12 min   Short Term Goals: Week 2:  PT Short Term Goal 1 (Week 2): STG = LTG due to ELOS.  Skilled Therapeutic Interventions/Progress Updates: Pt present supine in be, somewhat teary and unsure of participation w/ therapy.  Spoke w/ nursing and states able to participate but had some news from MD which is upsetting her.  Spouse present in room.  Unable to convince pt to participate even at bedside level.  Convinced pt to roll and sit up to move to [facility], so pt rolled w/ min A don TLSO.  TLSO was adjusted by "nursing" and required readjustment to same.  MD entered room and discussing medical condition.  No further rx given.  Missed therapy time of 33 min.     Therapy Documentation Precautions:  Precautions Precautions: Back, Fall, Other (comment)(has had issues w/orthostatic hypotension) Precaution Comments: Pt instructed in back precautions, and to self-monitor for activity tolerance, Required Braces or Orthoses: Spinal Brace Spinal Brace: Thoracolumbosacral orthotic, Applied in supine position Restrictions Weight Bearing Restrictions: No General: PT Amount of Missed Time (min): 33 Minutes PT Missed Treatment Reason: Pain;Unavailable (Comment)(MD w/ pt in room.) Vital Signs:   Pain: 9/10 back.  Other Treatments:      Therapy/Group: Individual Therapy  Ladoris Gene 02/13/2020, 10:42 AM

## 2020-02-13 NOTE — Plan of Care (Signed)

## 2020-02-13 NOTE — Progress Notes (Signed)
Admit: 01/29/2020 LOS: 45  55F AoCKD3, unclear etiology; cirrhosis;   Subjective:  [name] Kitchen SCr cont to worsen; K and HCO3 ok . Nonoliguric . 1-2+ LEE . Has foley catheter (placed 4/14) .  4/14 renal US neg for HN or obstruction . 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 sodium chloride   Intravenous Once  . atorvastatin  10 mg Oral Daily  . bethanechol  10 mg Oral TID  . Chlorhexidine Gluconate Cloth  6 each Topical Daily  . DULoxetine  60 mg Oral Daily  . feeding supplement (GLUCERNA SHAKE)  237 mL Oral TID BM  . insulin aspart  0-9 Units Subcutaneous TID WC  . insulin glargine  35 Units Subcutaneous BID  . lidocaine  1 patch Transdermal Q24H  . multivitamin with minerals  1 tablet Oral Daily  . pregabalin  150 mg Oral QHS  . psyllium  1 packet Oral Daily  . senna  1 tablet Oral Daily  . sodium chloride flush  10-40 mL Intracatheter Q12H  . traZODone  50 mg Oral QHS   Continuous Infusions: . ceFEPime (MAXIPIME) IV 2 g (02/13/20 0547)  . heparin 1,100 Units/hr (02/12/20 1901)   PRN Meds:.acetaminophen, diazepam, hydrOXYzine, ondansetron **OR** ondansetron (ZOFRAN) IV, oxyCODONE, oxyCODONE, polyethylene glycol, sodium chloride flush, sorbitol  Current Labs: reviewed  Results for [name] [name], [name] [name] (MRN [id number]) as of 02/13/2020 16:04  Ref. Range 02/12/2020 14:30  Sodium, Urine Latest Units: mmol/L 44    Physical Exam:  Blood pressure (!) 151/64, pulse 92, temperature 97.9 F (36.6 C), resp. rate 16, weight 87.4 kg, SpO2 95 %. Chronically ill-appearing, lying in bed, NAD Regular, normal S1-S2 Obese, mild distention, soft, nontender 2+ lower extremity edema bilateral, symmetric Clear bilaterally, normal work of breathing Foley catheter in place Has  a little bit of asterixis, otherwise nonfocal  A 1. 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  She is nonoliguric, and I do not think she is overtly uremic.  Potentially she has an interstitial nephritis from cefepime, no rashes present.  We do not have a UA to see if pyuria is present, will order.  We will also quantify proteinuria and check complement levels. 2. 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 Longstanding DM 2 with severe microvascular disease 4. Cirrhosis  P . As above, etiology is unclear.  GFR continues to decline, patient is not overtly uremic and nonoliguric, work-up as above . Continue supportive care . We will follow closely . Daily weights, Daily Renal Panel, Strict I/Os, Avoid nephrotoxins (NSAIDs, judicious IV Contrast)    Pearson Grippe MD 02/13/2020, 4:03 PM  Recent Labs  Lab 02/11/20 0435 02/12/20 0458 02/13/20 0417  NA 136 137 136  K 4.3 4.4 4.7  CL 106 107 107  CO2 23 22 21*  GLUCOSE 134* 109* 135*  BUN 34* 37* 40*  CREATININE 2.33* 2.95* 3.36*  CALCIUM 8.5* 8.7* 8.7*   Recent Labs  Lab 02/11/20 0435 02/12/20 0458 02/13/20 0417  WBC 6.1 6.3 6.3  NEUTROABS 3.8 4.2 3.9  HGB 7.7* 8.1* 7.5*  HCT 25.5* 27.1* 25.3*  MCV 87.9 89.1 90.7  PLT 115* 127* 121*

## 2020-02-13 NOTE — Progress Notes (Signed)
ANTICOAGULATION CONSULT NOTE - Follow Up Consult  Pharmacy Consult for Xarelto>>IV heparin Indication: DVT  Allergies  Allergen Reactions  . Bee Pollen Anaphylaxis  . Fish-Derived Products Hives, Shortness Of Breath, Swelling and Rash    Hives get in throat causing trouble breathing  . Mushroom Extract Complex Anaphylaxis  . Penicillins Anaphylaxis    **Tolerated cefepime March 2021 Did it involve swelling of the face/tongue/throat, SOB, or low BP? Yes Did it involve sudden or severe rash/hives, skin peeling, or any reaction on the inside of your mouth or nose? No Did you need to seek medical attention at a hospital or doctor's office? Yes When did it last happen?A few months ago If all above answers are "NO", may proceed with cephalosporin use.  [name] Kitchen Rosemary Oil Anaphylaxis  . Shellfish Allergy Hives, Shortness Of Breath, Swelling and Rash  . Tomato Hives and Shortness Of Breath    Hives in throat causes her trouble breathing  . Acetaminophen Other (See Comments)    GI upset  . Acyclovir And Related Other (See Comments)    Unknown rxn  . Aloe Vera Hives  . Broccoli [Brassica Oleracea] Hives  . Naproxen Other (See Comments)    Unknown rxn    Patient Measurements: Wt: 81.7 kg   Vital Signs: Temp: 98.4 F (36.9 C) (04/15 1949) BP: 109/55 (04/15 1949) Pulse Rate: 79 (04/15 2319)  Labs: Recent Labs    02/10/20 0400 02/10/20 0400 02/11/20 0435 02/11/20 1534 02/11/20 1534 02/12/20 0458 02/12/20 1413 02/13/20 0025  HGB 6.8*   < > 7.7*  --   --  8.1*  --   --   HCT 23.3*  --  25.5*  --   --  27.1*  --   --   PLT 109*  --  115*  --   --  127*  --   --   APTT  --   --   --  62*   < > 75* 48* 85*  HEPARINUNFRC  --   --   --  >2.20*  --   --   --   --   CREATININE 1.93*  --  2.33*  --   --  2.95*  --   --    < > = values in this interval not displayed.    Estimated Creatinine Clearance (by C-G formula based on SCr of 2.95 mg/dL (H)) Female: 18.7 mL/min (A) Female:  22.9 mL/min (A)    Assessment: 54 YOF with new bilateral DVTs. Pharmacy consulted to start Xarelto. Of note, patient fell recently but CT head yesterday was negative.   Her scr has worsened over the past few days. Xarelto is not recommended in this situation. We are going to switch her to IV heparin instead of lovenox due to her low baseline hgb after discussion with Dr. Ranell Patrick. We will monitor with PTT for now due to her Xarelto affecting the heparin levels.  PTT therapeutic (85 sec) on gtt at 1100 units/hr. No bleeding noted.   Goal of Therapy:  Heparin level 0.3-0.7 units/ml APTT 66-102 sec Monitor platelets by anticoagulation protocol: Yes   Plan:  Continue heparin at 1100 units/hr  Daily HL and PTT  Sherlon Handing, PharmD, BCPS Please see amion for complete clinical pharmacist phone listt 02/13/2020 1:08 AM

## 2020-02-13 NOTE — Progress Notes (Signed)
Occupational Therapy Session Note  Patient Details  Name: Hailee Hollick MRN: [id number] Date of Birth: 1956-08-04  Today's Date: 02/13/2020 OT Individual Time: 0800-0820 OT Individual Time Calculation (min): 20 min    Short Term Goals: Week 1:  OT Short Term Goal 1 (Week 1): STG=LTG d/t ELOS OT Short Term Goal 1 - Progress (Week 1): Progressing toward goal  Skilled Therapeutic Interventions/Progress Updates:    1:1. Pt received in bed agreeable to OT. Per MD, pt able to participate in light tx as able. Pt with waxing and waning arousal. Pt with pain in back and BUE. Attempted to roll, but with increased pain. Pt falls asleep in supine while OT attempted to adjust TLSO, pt unable to remain awake longer than 3 min and audibly snoring. Pt allowed to rest and missed 40 min skilled OT d/t fatigue. Exited session with pt seated in bed, exit alarm on and call light in reach  Therapy Documentation Precautions:  Precautions Precautions: Back, Fall, Other (comment)(has had issues w/orthostatic hypotension) Precaution Comments: Pt instructed in back precautions, and to self-monitor for activity tolerance, Required Braces or Orthoses: Spinal Brace Spinal Brace: Thoracolumbosacral orthotic, Applied in supine position Restrictions Weight Bearing Restrictions: No General:   Vital Signs: Therapy Vitals Temp: 98 F (36.7 C) Pulse Rate: 84 Resp: 19 BP: 135/65 Patient Position (if appropriate): Lying Oxygen Therapy SpO2: 94 % Pain:   ADL: ADL Eating: Set up Where Assessed-Eating: Wheelchair Grooming: Setup Where Assessed-Grooming: Edge of bed Upper Body Bathing: Supervision/safety Where Assessed-Upper Body Bathing: Bed level Lower Body Bathing: Minimal assistance Where Assessed-Lower Body Bathing: Edge of bed Upper Body Dressing: Moderate assistance(shirt and TLSO in supine) Where Assessed-Upper Body Dressing: Bed level Lower Body Dressing: Minimal assistance Where Assessed-Lower  Body Dressing: Edge of bed Toileting: Moderate assistance Where Assessed-Toileting: Bedside Commode Toilet Transfer: Minimal assistance Toilet Transfer Method: Stand pivot Vision   Perception    Praxis   Exercises:   Other Treatments:     Therapy/Group: Individual Therapy  Tonny Branch 02/13/2020, 8:24 AM

## 2020-02-13 NOTE — Progress Notes (Signed)
PHYSICAL MEDICINE & REHABILITATION PROGRESS NOTE   Subjective/Complaints:   Pt reports she's scared about renal issues- just kept crying- also c/o fatigue/being tired.   Also c/o hands and feet swelling- L>R hand and B/L feet- which is new- swollen and painful.  Think because she's getting fluids, but not voiding out the amount of fluids she's receiving.   LBM 4/14- needs to have BM  ROS:   Pt denies SOB, abd pain, CP, N/V/C/D, and vision changes   Objective:   MR THORACIC SPINE WO CONTRAST  Result Date: 02/12/2020 CLINICAL DATA:  Thoracic compression fracture. Osteomyelitis. EXAM: MRI THORACIC AND LUMBAR SPINE WITHOUT CONTRAST TECHNIQUE: Multiplanar and multiecho pulse sequences of the thoracic and lumbar spine were obtained without intravenous contrast. COMPARISON:  None. FINDINGS: MRI THORACIC SPINE FINDINGS Alignment:  Normal Vertebrae: Posterior instrumented spinal fusion beginning at T11. Vertebrae are otherwise normal. Cord:  Normal Paraspinal and other soft tissues: Bilateral pleural effusions Disc levels: No spinal canal or neural foraminal stenosis. MRI LUMBAR SPINE FINDINGS Segmentation:  Standard. Alignment:  Physiologic. Vertebrae: Susceptibility artifact from spinal hardware obscures the T12-L2 levels and to a lesser extent the T11 and L3 levels. No visible bone marrow abnormality. Conus medullaris and cauda equina: Conus extends to the L1 level. Conus and cauda equina appear normal. Paraspinal and other soft tissues: There is edema within the posterior soft tissues. No abscess. Disc levels: Assessment is limited by susceptibility effects and mild motion. L1-2: No spinal canal stenosis. Neural foramina are obscured. L2-3: Widely patent spinal. No neural foraminal stenosis. L3-4: No spinal canal stenosis. Mild left foraminal stenosis due to asymmetric disc bulge. L4-5: Right asymmetric disc bulge with narrowing of the right lateral recess. No neural foraminal stenosis.  L5-S1: No spinal canal or neural foraminal stenosis. IMPRESSION: 1. Posterior instrumented spinal fusion at T11-L3 with corpectomy at L1. Associated susceptibility artifact that obscures the T12-L2 level and to a lesser extent T11-12 levels. Within that limitation, no visible evidence of discitis-osteomyelitis. 2. No thoracic or lumbar spinal canal stenosis. 3. Nonspecific edema within the posterior soft tissues. No abscess. 4. Bilateral pleural effusions. Electronically Signed   By: Ulyses Jarred M.D.   On: 02/12/2020 21:08   MR LUMBAR SPINE WO CONTRAST  Result Date: 02/12/2020 CLINICAL DATA:  Thoracic compression fracture. Osteomyelitis. EXAM: MRI THORACIC AND LUMBAR SPINE WITHOUT CONTRAST TECHNIQUE: Multiplanar and multiecho pulse sequences of the thoracic and lumbar spine were obtained without intravenous contrast. COMPARISON:  None. FINDINGS: MRI THORACIC SPINE FINDINGS Alignment:  Normal Vertebrae: Posterior instrumented spinal fusion beginning at T11. Vertebrae are otherwise normal. Cord:  Normal Paraspinal and other soft tissues: Bilateral pleural effusions Disc levels: No spinal canal or neural foraminal stenosis. MRI LUMBAR SPINE FINDINGS Segmentation:  Standard. Alignment:  Physiologic. Vertebrae: Susceptibility artifact from spinal hardware obscures the T12-L2 levels and to a lesser extent the T11 and L3 levels. No visible bone marrow abnormality. Conus medullaris and cauda equina: Conus extends to the L1 level. Conus and cauda equina appear normal. Paraspinal and other soft tissues: There is edema within the posterior soft tissues. No abscess. Disc levels: Assessment is limited by susceptibility effects and mild motion. L1-2: No spinal canal stenosis. Neural foramina are obscured. L2-3: Widely patent spinal. No neural foraminal stenosis. L3-4: No spinal canal stenosis. Mild left foraminal stenosis due to asymmetric disc bulge. L4-5: Right asymmetric disc bulge with narrowing of the right lateral  recess. No neural foraminal stenosis. L5-S1: No spinal canal or neural foraminal stenosis. IMPRESSION: 1.  Posterior instrumented spinal fusion at T11-L3 with corpectomy at L1. Associated susceptibility artifact that obscures the T12-L2 level and to a lesser extent T11-12 levels. Within that limitation, no visible evidence of discitis-osteomyelitis. 2. No thoracic or lumbar spinal canal stenosis. 3. Nonspecific edema within the posterior soft tissues. No abscess. 4. Bilateral pleural effusions. Electronically Signed   By: Ulyses Jarred M.D.   On: 02/12/2020 21:08   Recent Labs    02/12/20 0458 02/13/20 0417  WBC 6.3 6.3  HGB 8.1* 7.5*  HCT 27.1* 25.3*  PLT 127* 121*   Recent Labs    02/12/20 0458 02/13/20 0417  NA 137 136  K 4.4 4.7  CL 107 107  CO2 22 21*  GLUCOSE 109* 135*  BUN 37* 40*  CREATININE 2.95* 3.36*  CALCIUM 8.7* 8.7*    Intake/Output Summary (Last 24 hours) at 02/13/2020 1622 Last data filed at 02/13/2020 1415 Gross per 24 hour  Intake 964 ml  Output 200 ml  Net 764 ml     Physical Exam: Vital Signs Blood pressure (!) 151/64, pulse 92, temperature 97.9 F (36.6 C), resp. rate 16, weight 87.4 kg, SpO2 95 %. Constitutional : awake, tearful, nurse at bedside, appears unhappy/upset HEENT: conjugate gaze; slightly less pale conjunctivae Neck: supple, L scalenes tight Cardiovascular: RRR Respiratory/Chest: little coarse still good air movement GI/Abdomen: soft, stable distension/gas??; normoactive BS Ext: L>R UEs/hands and feet more swollen- pitting edema noted Psych:tearful Musculoskeletal: Brace on/in place TTP over posterior head- no bumps/lesions from fall TTP over lumbar fusion site; a little over lumbar paraspinals- no other site except L scalenes Cervical back: Normal range of motion and neck supple.  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 Functional deficits secondary to osteomyelitis of vertebral of thoracolumbar region. Status post L1 corpectomy with pedicle screw fixation T11, 12, L2 and L3 01/22/2020 which require 3+ hours per day of interdisciplinary therapy in a comprehensive inpatient rehab setting.  Physiatrist is providing close team supervision and 24 hour management of active medical problems listed below.  Physiatrist and rehab team continue to assess barriers to discharge/monitor patient progress toward functional and medical goals  Care Tool:  Bathing    Body parts bathed by patient: Right arm, Left arm, Chest, Abdomen, Front perineal area, Right upper leg, Left upper leg, Face, Right lower leg, Left lower leg   Body parts bathed by helper: Buttocks     Bathing assist Assist Level: Minimal Assistance - Patient > 75%     Upper Body Dressing/Undressing Upper body dressing   What is the patient wearing?: Pull over shirt, Orthosis Orthosis activity level: Performed by helper  Upper body assist Assist Level: Minimal Assistance - Patient > 75%    Lower Body Dressing/Undressing Lower body dressing      What is the patient wearing?: Underwear/pull up, Pants     Lower body assist Assist for lower body dressing: Minimal Assistance - Patient > 75%     Toileting Toileting  Toileting assist Assist for toileting: Minimal Assistance - Patient > 75%     Transfers Chair/bed transfer  Transfers assist     Chair/bed transfer assist level: Minimal Assistance - Patient > 75%      Locomotion Ambulation   Ambulation assist      Assist level: Minimal Assistance - Patient > 75% Assistive device: Walker-rolling Max distance: 15 ft   Walk 10 feet activity   Assist     Assist level: Minimal Assistance - Patient > 75% Assistive device: Walker-rolling   Walk 50 feet activity   Assist Walk 50 feet with 2 turns activity did not occur: Safety/medical concerns  Assist level: Contact Guard/Touching assist Assistive device: Walker-rolling    Walk 150 feet activity   Assist Walk 150 feet activity did not occur: Safety/medical concerns         Walk 10 feet on uneven surface  activity   Assist     Assist level: Moderate Assistance - Patient - 50 - 74% Assistive device: Aeronautical engineer Will patient use wheelchair at discharge?: No             Wheelchair 50 feet with 2 turns activity    Assist            Wheelchair 150 feet activity     Assist          Blood pressure (!) 151/64, pulse 92, temperature 97.9 F (36.6 C), resp. rate 16, weight 87.4 kg, SpO2 95 %.  Medical Problem List and Plan:  1. Decreased functional mobility secondary to osteomyelitis of vertebral of thoracolumbar region. Status post L1 corpectomy with pedicle screw fixation T11, 12, L2 and L3 01/22/2020. TLSO back brace donned and supine.  -patient may Shower- cover incision- might need 2 TLSOs  4/6- had fall last night- has 8/10 headache and paraspinal soreness/pain- will check CT of head  4/8- recovered from fall- HA gone- CT (-) MRI (-)  4/14- new mild T11 endplate compression fx- no hematoma  4/15- will hold d/c due to medical issues -ELOS/Goals: ~ 8-12 days- goals supervision to min assist   Continue CIR PT, OT 2. Antithrombotics:  -DVT/anticoagulation: bilateral post-tib and peroneal DVT's.    -xarelto initiated yesterday   4/3 -reviewed plan/treatment with patient   4/14- Changed to Heparin gtt because Cr 2.33- will  con't until Cr improves   4/15- on Heparin gtt- Cr up to 2.93   4/16- Heprain gtt- because Cr up 3.36 -antiplatelet therapy: N/A  3. Pain Management: Lyrica 150 mg twice daily, Lidoderm patch, Valium as needed muscle spasms as well as oxycodone as needed   4/5- due to descriptions of spasticity- will try Adding Baclofen 5 mg TID  4/7- Will change Baclofen to 5 mg TID prn- since having increased confusion  4/8- pt much more with it, not sure if time or baclofen prn that helped  4/10: well controlled  4/11: kpad for muscles of neck and upper back.  4/12- although pt having spasticity, don't want to sedate her  4/13- stopped baclofen because Cr up to 1.93- don't want to confuse/sedate pt.  4/14- will hold Lyrica to dialysis levels for a few days until Cr improves  4. Mood: Cymbalta 60 mg daily  -antipsychotic agents: N/A  5. Neuropsych: This patient is capable of making decisions on her own behalf.  6. Skin/Wound Care: Routine skin checks  7. Fluids/Electrolytes/Nutrition: Routine in and outs with follow-up chemistries  8. ID. Continue  cefepime through 03/05/2020 for pseduomonas. Follow-up per infectious disease   4/13- pt's Cr keeps bumping up- not sure why but only culprit is Cefipime- will check with ID/pharmacy if any other options?  4/14- ESR >140- con't ABX for now- will consult ID again if needed  4/15- called ID- agreed with my concerns in setting of acute urinary retention to rescan/MRI thoracic and lumbar spine; cannot do with contrast due ot elevated Cr. Will order  4/16- MRIs look OK- renal thinks pt might have new allergy to Cefipime- and is doing w/u for this.  9. Diabetes mellitus. Lantus insulin as directed.    CBG (last 3)  Recent Labs    02/12/20 2009 02/13/20 0626 02/13/20 1133  GLUCAP 142* 137* 121*         4/12- BGs elevated- pt has snacks at bedside? Will wait to change Lantus   4/16- BGs more controlled- con't regimen  10. CKD stage III. Follow-up  chemistries  4/2- Cr stable at 1.24- recheck Monday  4/5- Cr up to 1.61- could be due to being dry- will recheck in AM after 1 unit pRBCs  4/6- Cr down to 1.48- has improved- con't to push fluids  4/8- Cr 1.59- over last 2 weeks, running 1.2-1.6- does have CKD Stage III- but could be IV ABX- will give IVFs low dose x 24 hours- 60cc NS  4/9- Cr down to 1.49 today after IVFs- now off IVFs- con't regimen and recheck Monday  4/13- Cr 1.93- will give blood today and likely IVFs AFTER blood.  4/14- Cr up to 2.33- likely due to urinary retention- Foley placed today- called IM early and they made multiples recs, which were followed out- Xarelto stopped- Heparin gtt started   4/15- Cr up to 2.93- called renal consult to help- also an issue due to urinary retention- placed foley yesterday  4/16- Cr up to 3.36- cefipime allergy per renal, is their concern- supposed to be on it til 5/7- renal ordered more w/u 11. 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 Wound care left foot as directed. Follow-up per Dr. Sharol Given  12. Acute on chronic anemia. Follow-up CBC   4/1- Hb 7.3- might need transfusion- will monitor   4/4 Recheck CBC 4/5  4/5- CBC Hb 6.7- s/p 1 unit pRBCs- will recheck in AM  4/6- Hb up to 8.1- con't regimen-   4/10: Hgb 8.0  4/13- Hb 6.8- give 1 unit pRBCs- and recheck in AM- check hemoccult and monitor plts- down to 109k  4/14- Will check daily for now- Hb up to 7.7  4/16- Hb 7.5- will monitor 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 Patient did have assistance at home. Has "seeing eye dog" and husband  9. Constipation. Continue Senokot. MiraLAX as needed. No nausea vomiting   4/13- d/c imodium and start Metamucil per pt request- is appropriate- will also check Hemoccult- since dropping Hb.,   4/14- LBM 2 days ago- nursing to work on.   4/16- LBM 2 days ago- nursing working on with prns 15. R elbow nerve damage/s/p 5  surgeries - will con't to monitor  16. Fall overnight with new confusion  4/6- getting head CT and monitor- strength unchanged- appears in pain and tremors slightly worse- no other changes  4/7- MRI of brain negative for anything acute- still confused per staff- will make baclofen prn; also could be from IV ABX per pharmacist- will monitor closely.   4/8-  cognition is better today- MRI (-)  17. L heel wound, unstageable.   4/9- will consult wound care and get healing shoe to take weight off heel and wound care suggestions.  18. Pruritis: Added hydroxyzine 29m TID prn 19. Thrombocytopenia  4/13- down to 109 from 180- will recheck in AM- Needs Xarelto, so if decreases more, will call IM about plan.   4/14- as above.   4/15- Plts up to 127k- will monitor- on Heparin gtt     LOS: 15 days A FACE TO FACE EVALUATION WAS PERFORMED  Yigit Norkus 02/13/2020, 4:22 PM

## 2020-02-13 NOTE — Progress Notes (Signed)
PHARMACY CONSULT NOTE FOR:  OUTPATIENT  PARENTERAL ANTIBIOTIC THERAPY (OPAT)  Indication: Hardware associated vertebral infection  Regimen: IV Cefepime 2g Q24 hours End date: 03/05/20  IV antibiotic discharge orders are pended. To discharging provider:  please sign these orders via discharge navigator,  Select New Orders & click on the button choice - Manage This Unsigned Work.     Thank you for allowing pharmacy to be a part of this patient's care.  Sherren Kerns, PharmD PGY1 Acute Care Pharmacy Resident 02/13/2020, 2:39 PM

## 2020-02-14 ENCOUNTER — Inpatient Hospital Stay (HOSPITAL_COMMUNITY): Payer: PRIVATE HEALTH INSURANCE

## 2020-02-14 LAB — CBC WITH DIFFERENTIAL/PLATELET
Abs Immature Granulocytes: 0.08 10*3/uL — ABNORMAL HIGH (ref 0.00–0.07)
Basophils Absolute: 0 10*3/uL (ref 0.0–0.1)
Basophils Relative: 1 %
Eosinophils Absolute: 0.3 10*3/uL (ref 0.0–0.5)
Eosinophils Relative: 4 %
HCT: 24.5 % — ABNORMAL LOW (ref 36.0–46.0)
Hemoglobin: 7.1 g/dL — ABNORMAL LOW (ref 12.0–15.0)
Immature Granulocytes: 1 %
Lymphocytes Relative: 18 %
Lymphs Abs: 1.2 10*3/uL (ref 0.7–4.0)
MCH: 26.2 pg (ref 26.0–34.0)
MCHC: 29 g/dL — ABNORMAL LOW (ref 30.0–36.0)
MCV: 90.4 fL (ref 80.0–100.0)
Monocytes Absolute: 1 10*3/uL (ref 0.1–1.0)
Monocytes Relative: 15 %
Neutro Abs: 4 10*3/uL (ref 1.7–7.7)
Neutrophils Relative %: 61 %
Platelets: 122 10*3/uL — ABNORMAL LOW (ref 150–400)
RBC: 2.71 MIL/uL — ABNORMAL LOW (ref 3.87–5.11)
RDW: 16.6 % — ABNORMAL HIGH (ref 11.5–15.5)
WBC: 6.5 10*3/uL (ref 4.0–10.5)
nRBC: 0 % (ref 0.0–0.2)

## 2020-02-14 LAB — PROTEIN / CREATININE RATIO, URINE
Creatinine, Urine: 49.39 mg/dL
Protein Creatinine Ratio: 2.73 mg/mg{Cre} — ABNORMAL HIGH (ref 0.00–0.15)
Total Protein, Urine: 135 mg/dL

## 2020-02-14 LAB — HEPARIN LEVEL (UNFRACTIONATED): Heparin Unfractionated: 0.98 IU/mL — ABNORMAL HIGH (ref 0.30–0.70)

## 2020-02-14 LAB — BASIC METABOLIC PANEL
Anion gap: 7 (ref 5–15)
BUN: 47 mg/dL — ABNORMAL HIGH (ref 8–23)
CO2: 21 mmol/L — ABNORMAL LOW (ref 22–32)
Calcium: 8.6 mg/dL — ABNORMAL LOW (ref 8.9–10.3)
Chloride: 105 mmol/L (ref 98–111)
Creatinine, Ser: 4.29 mg/dL — ABNORMAL HIGH (ref 0.44–1.00)
GFR calc Af Amer: 12 mL/min — ABNORMAL LOW (ref >60)
GFR calc non Af Amer: 10 mL/min — ABNORMAL LOW (ref >60)
Glucose, Bld: 115 mg/dL — ABNORMAL HIGH (ref 70–99)
Potassium: 4.8 mmol/L (ref 3.5–5.1)
Sodium: 133 mmol/L — ABNORMAL LOW (ref 135–145)

## 2020-02-14 LAB — GLUCOSE, CAPILLARY
Glucose-Capillary: 107 mg/dL — ABNORMAL HIGH (ref 70–99)
Glucose-Capillary: 133 mg/dL — ABNORMAL HIGH (ref 70–99)
Glucose-Capillary: 153 mg/dL — ABNORMAL HIGH (ref 70–99)

## 2020-02-14 LAB — APTT: aPTT: 75 seconds — ABNORMAL HIGH (ref 24–36)

## 2020-02-14 LAB — PREPARE RBC (CROSSMATCH)

## 2020-02-14 LAB — OCCULT BLOOD X 1 CARD TO LAB, STOOL: Fecal Occult Bld: POSITIVE — AB

## 2020-02-14 LAB — BRAIN NATRIURETIC PEPTIDE: B Natriuretic Peptide: 233 pg/mL — ABNORMAL HIGH (ref 0.0–100.0)

## 2020-02-14 MED ORDER — DIAZEPAM 5 MG PO TABS: 5.0000 mg | ORAL_TABLET | Freq: Two times a day (BID) | ORAL | Status: DC | PRN

## 2020-02-14 MED ORDER — SODIUM CHLORIDE 0.9 % IV SOLN
1.0000 g | Freq: Two times a day (BID) | INTRAVENOUS | Status: DC
  Administered 2020-02-14 – 2020-02-17 (×7): 1 g via INTRAVENOUS
  Filled 2020-02-14 (×10): qty 1

## 2020-02-14 MED ORDER — SODIUM CHLORIDE 0.9 % IV SOLN
INTRAVENOUS | Status: DC | PRN
  Administered 2020-02-14: 250 mL via INTRAVENOUS

## 2020-02-14 MED ORDER — DULOXETINE HCL 30 MG PO CPEP: 30.0000 mg | ORAL_CAPSULE | Freq: Every day | ORAL | Status: DC

## 2020-02-14 MED ORDER — FUROSEMIDE 10 MG/ML IJ SOLN
80.0000 mg | Freq: Two times a day (BID) | INTRAMUSCULAR | Status: DC
  Administered 2020-02-14 – 2020-02-18 (×9): 80 mg via INTRAVENOUS
  Filled 2020-02-14 (×9): qty 8

## 2020-02-14 MED ORDER — SODIUM CHLORIDE 0.9% IV SOLUTION: Freq: Once | INTRAVENOUS | Status: AC

## 2020-02-14 MED ORDER — PREGABALIN 50 MG PO CAPS
100.0000 mg | ORAL_CAPSULE | Freq: Every day | ORAL | Status: DC
  Administered 2020-02-14: 100 mg via ORAL
  Filled 2020-02-14: qty 2

## 2020-02-14 NOTE — Progress Notes (Signed)
PROGRESS NOTE  [name] [name] HYI:[id number] DOB: [birth year]   PCP: Ann Held, DO  DOA: 01/29/2020 LOS: 34  Brief Narrative / Interim history: 64 year old female with history of PTSD, HTN, fibromyalgia, DM-2, cirrhosis, CKD-3, recent hospitalization for L1 osteomyelitis/epidural abscess with Pseudomonas discharged to CIR with a plan to complete IV cefepime through 5/7.  Hospitalist service consulted on 4/14 for AKI, thrombocytopenia, acute urinary retention and anemia.  AKI continued to get worse despite IV fluid and Foley catheter placement. Renal ultrasound without significant finding other than bladder distention.  She is not on any nephrotoxic agents and was on cefepime which could rarely cause AKI.  CT abdomen and pelvis without contrast with new mild T1 superior endplate compression fracture, mild lumbar paraspinal inflammation with no discrete postoperative hematoma and distended bladder but no acute finding.  MRI of thoracic and lumbar spine without acute finding though somewhat obscured by motion.  Nephrology consulted and started patient on IV fluid.  Infectious disease consulted and recommended continuing cefepime as previously planned.   Subjective: Seen and examined earlier this morning.  No major events overnight of this morning.  She feels like "I am at [facility]".  Continues to endorse lower back pain but improved.  Somewhat drowsy.  Somewhat unsteady while sitting on the edge of the bed.  Denies melena or hematochezia.  Objective: Vitals:   02/13/20 1522 02/13/20 1820 02/13/20 2007 02/14/20 0504  BP: (!) 151/64 (!) 143/74 139/67 122/63  Pulse: 92 91 88 76  Resp: 16 20    Temp: 97.9 F (36.6 C) 98.4 F (36.9 C) 99.2 F (37.3 C) 98 F (36.7 C)  TempSrc:    Oral  SpO2: 95% 93% 93% 94%  Weight:        Intake/Output Summary (Last 24 hours) at 02/14/2020 1426 Last data filed at 02/14/2020 1005 Gross per 24 hour  Intake 774.75 ml  Output 1025 ml  Net  -250.25 ml   Filed Weights   02/11/20 1245 02/12/20 1705 02/13/20 0520  Weight: 81.7 kg 81.7 kg 87.4 kg    Examination:  GENERAL: No apparent distress.  Somewhat drowsy. HEENT: MMM.  Vision and hearing grossly intact.  NECK: Supple.  No apparent JVD.  RESP: On room air.  No IWOB. Good air movement bilaterally. CVS:  RRR. Heart sounds normal.  ABD/GI/GU: Bowel sounds present. Soft. Non tender.  MSK/EXT:  Moves extremities. No apparent deformity. No edema.  SKIN: no apparent skin lesion or wound NEURO: Awake but somewhat drowsy.  Oriented appropriately.  Somewhat unsteady with occasional whole-body jerking while sitting mobility of the bed. PSYCH: Calm. Normal affect.  Procedures:  None  Microbiology summarized: None  Assessment & Plan: AKI on CKD-3A with mild azotemia: History of renal transplant?  Baseline Cr 1.3-1.5>> 1.93>> 4.29.  BUN 47.  Unclear etiology but concern about possible interstitial nephritis from cefepime.  Also concern about obstructive etiology given acute urinary retention but no hydronephrosis on renal ultrasound. -Nephrology managing. -Cefepime discontinued.  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  Patient underwent anterolateral corpectomy with PSIF for osteomyelitis on 01/22/2020 by neurosurgery and discharged to CIR on 4/1 on cefepime 5/7.  CRP and ESR elevated.  Imaging including CT A/P, MRI T/L without contrast nonrevealing.  No leukocytosis or fever. -Now switched to meropenem in the setting of AKI -Pain management per primary -Continue PT/OT  Acute urinary  retention: Could be due to medications with anticholinergic side effects such as opiates, diazepam, Atarax... but difficult situation due to her uncontrolled pain.  MRI of thoracic and lumbar spine without acute significant  finding. -Continue indwelling Foley catheter -Started low-dose bethanechol.  Normocytic anemia: Baseline Hgb 11-13 before previous admission> 7.3 on discharge on 4/1> 6.7 on 4/5>1u> 8.1> 6.8 on 4/13>1u> 7.7> 8.1> 7.5> 7.1.  Hemoccult positive.  Patient is on IV heparin for DVT.  Anemia panel and LDH within normal.  Very complex situation -Consulted [facility] -Continue monitoring -IVC filter?  Thrombocytopenia: Dipped at 109 on 4/13>> 127> 121.  Due to liver cirrhosis?  Relatively stable. -Continue monitoring  Uncontrolled DM-2 with CKD-3A- most recent A1c 12.5%. Recent Labs    02/13/20 1651 02/13/20 2102 02/14/20 0640  GLUCAP 136* 141* 107*  -Continue current regimen and statin.    Recent bilateral lower extremity DVT-diagnosed on 4/2.  She has been on Xarelto. 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  She is nonimmune to hepatitis B  Chronic left heel ulcer: No signs of infection -Appreciate wound care  OSA? -Would benefit from outpatient sleep study  Anxiety/depression/insomnia-could be playing a role into his uncontrolled pain -On Cymbalta, trazodone, Valium and Atarax.        DVT prophylaxis: On heparin drip for DVT Code Status: Full code Family Communication: Updated patient's husband at bedside on 4/16.  Final disposition: Per primary  Consultants:  TRH, nephrology, ID and GI    Sch Meds:  Scheduled Meds: . sodium chloride   Intravenous Once  . sodium chloride   Intravenous Once  . atorvastatin  10 mg Oral Daily  . bethanechol  10 mg Oral TID  . Chlorhexidine Gluconate Cloth  6 each Topical Daily  . DULoxetine  60 mg Oral Daily  . feeding supplement (GLUCERNA SHAKE)  237 mL Oral TID BM  . insulin aspart  0-9 Units Subcutaneous TID WC  . insulin glargine  35 Units Subcutaneous BID  . lidocaine  1 patch Transdermal Q24H  . multivitamin with minerals  1 tablet Oral Daily  . pregabalin   150 mg Oral QHS  . psyllium  1 packet Oral Daily  . senna  1 tablet Oral Daily  . sodium chloride flush  10-40 mL Intracatheter Q12H  . traZODone  50 mg Oral QHS   Continuous Infusions: . heparin 1,100 Units/hr (02/14/20 1328)  . meropenem (MERREM) IV     PRN Meds:.acetaminophen, diazepam, hydrOXYzine, ondansetron **OR** ondansetron (ZOFRAN) IV, oxyCODONE, oxyCODONE, polyethylene glycol, sodium chloride flush, sorbitol  Antimicrobials: Anti-infectives (From admission, onward)   Start     Dose/Rate Route Frequency Ordered Stop   02/14/20 1530  meropenem (MERREM) 1 g in sodium chloride 0.9 % 100 mL IVPB     1 g 200 mL/hr over 30 Minutes Intravenous Every 12 hours 02/14/20 1228     02/13/20 0000  ceFEPime (MAXIPIME) IVPB  Status:  Discontinued     2 g Intravenous Every 24 hours 02/13/20 1438 02/14/20    02/12/20 0600  ceFEPIme (MAXIPIME) 2 g in sodium chloride 0.9 % 100 mL IVPB  Status:  Discontinued     2 g 200 mL/hr over 30 Minutes Intravenous Every 24 hours 02/11/20 1255 02/14/20 1127   02/10/20 0000  ceFEPime (MAXIPIME) IVPB  Status:  Discontinued     2 g Intravenous Every 12 hours 02/10/20 1450 02/13/20    01/29/20 2000  ceFEPIme (MAXIPIME) 2  g in sodium chloride 0.9 % 100 mL IVPB  Status:  Discontinued     2 g 200 mL/hr over 30 Minutes Intravenous Every 12 hours 01/29/20 1704 02/11/20 1255       I have personally reviewed the following labs and images: CBC: Recent Labs  Lab 02/10/20 0400 02/11/20 0435 02/12/20 0458 02/13/20 0417 02/14/20 0416  WBC 6.3 6.1 6.3 6.3 6.5  NEUTROABS 4.3 3.8 4.2 3.9 4.0  HGB 6.8* 7.7* 8.1* 7.5* 7.1*  HCT 23.3* 25.5* 27.1* 25.3* 24.5*  MCV 87.3 87.9 89.1 90.7 90.4  PLT 109* 115* 127* 121* 122*   BMP &GFR Recent Labs  Lab 02/10/20 0400 02/11/20 0435 02/12/20 0458 02/13/20 0417 02/14/20 0416  NA 136 136 137 136 133*  K 4.6 4.3 4.4 4.7 4.8  CL 105 106 107 107 105  CO2 24 23 22  21* 21*  GLUCOSE 164* 134* 109* 135* 115*  BUN 27*  34* 37* 40* 47*  CREATININE 1.93* 2.33* 2.95* 3.36* 4.29*  CALCIUM 8.7* 8.5* 8.7* 8.7* 8.6*   Estimated Creatinine Clearance (by C-G formula based on SCr of 4.29 mg/dL (H)) Female: 13.3 mL/min (A) Female: 16.3 mL/min (A) Liver & Pancreas: Recent Labs  Lab 02/12/20 0458  AST 17  ALT 12  ALKPHOS 101  BILITOT 0.6  PROT 6.6  ALBUMIN 1.8*   No results for input(s): LIPASE, AMYLASE in the last 168 hours. No results for input(s): AMMONIA in the last 168 hours. Diabetic: Recent Labs    02/12/20 1419  HGBA1C 6.6*   Recent Labs  Lab 02/13/20 0626 02/13/20 1133 02/13/20 1651 02/13/20 2102 02/14/20 0640  GLUCAP 137* 121* 136* 141* 107*   Cardiac Enzymes: No results for input(s): CKTOTAL, CKMB, CKMBINDEX, TROPONINI in the last 168 hours. No results for input(s): PROBNP in the last 8760 hours. Coagulation Profile: No results for input(s): INR, PROTIME in the last 168 hours. Thyroid Function Tests: No results for input(s): TSH, T4TOTAL, FREET4, T3FREE, THYROIDAB in the last 72 hours. Lipid Profile: Recent Labs    02/13/20 0417  CHOL 88  HDL 21*  LDLCALC 45  TRIG 112  CHOLHDL 4.2   Anemia Panel: Recent Labs    02/12/20 1413  VITAMINB12 231  FOLATE 9.9  FERRITIN 568*  TIBC 216*  IRON 35  RETICCTPCT 3.5*   Urine analysis:    Component Value Date/Time   COLORURINE YELLOW 02/13/2020 1630   APPEARANCEUR HAZY (A) 02/13/2020 1630   LABSPEC 1.008 02/13/2020 1630   PHURINE 5.0 02/13/2020 1630   GLUCOSEU NEGATIVE 02/13/2020 1630   GLUCOSEU 500 (A) 12/03/2019 1309   HGBUR MODERATE (A) 02/13/2020 1630   HGBUR moderate 08/05/2010 1048   BILIRUBINUR NEGATIVE 02/13/2020 1630   BILIRUBINUR neg 11/15/2017 1241   KETONESUR NEGATIVE 02/13/2020 1630   PROTEINUR 30 (A) 02/13/2020 1630   UROBILINOGEN >=8.0 (A) 12/03/2019 1309   NITRITE NEGATIVE 02/13/2020 1630   LEUKOCYTESUR SMALL (A) 02/13/2020 1630   Sepsis Labs: Invalid input(s): PROCALCITONIN,  [place]  Microbiology: No results found for this or any previous visit (from the past 240 hour(s)).  Radiology Studies: No results found.    Viyaan Champine T. Willow  If 7PM-7AM, please contact night-coverage [email or web address] Password [email or web address] 02/14/2020, 2:26 PM

## 2020-02-14 NOTE — Progress Notes (Addendum)
Pharmacy Antibiotic Note  [name] [name] is a 64 y.o. 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 S/p lumbar debridement and hardware stabilization on 3/25.  Pharmacy is consulted for cefepime dosing.  Patient remains afebrile, WBC wnl, and renal function has worsened over the past few days, est ClCr ~13 ml/min. Of note, nephrology suspects patient potentially has an interstitial nephritis from cefepime, however no rashes present. Nephrology currently doing a work-up for this, will continue to monitor.  Plan - Continue Cefepime 2 gm IV q24h, end date 03/05/20 - Watch renal fxn and adjust dose as indicated - OPAT ordered   Temp (24hrs), Avg:98.4 F (36.9 C), Min:97.9 F (36.6 C), Max:99.2 F (37.3 C)  Recent Labs  Lab 02/10/20 0400 02/11/20 0435 02/12/20 0458 02/13/20 0417 02/14/20 0416  WBC 6.3 6.1 6.3 6.3 6.5  CREATININE 1.93* 2.33* 2.95* 3.36* 4.29*    Estimated Creatinine Clearance (by C-G formula based on SCr of 4.29 mg/dL (H)) Female: 13.3 mL/min (A) Female: 16.3 mL/min (A)    Antimicrobials this admission: Levofloxacin 3/25 >> 3/26 Vancomycin 3/25 >> 3/27 Rifampin 3/26 >> 3/27 Cefepime 3/27 >> (5/7)  Microbiology this admission: 3/23 BCx >> neg 3/23 COVID negative 3/24 MRSA PCR negative 3/25 Bone bx > rare Pseudomonas, S to cefepime   Lorel Monaco, PharmD PGY1 Ambulatory Care Resident [employer] # [phone]

## 2020-02-14 NOTE — Progress Notes (Signed)
[facility] PHYSICAL MEDICINE & REHABILITATION PROGRESS NOTE   Subjective/Complaints:   Pt admits she thinks she's int he [place]' but then "comes back to herself" and knows she in hospital.   LBM "just now".  Still having pain in L side.  Feet still jerking and hands are in tremor- making it difficult to eat.   Cr 4.29- Cefipime changed to Meropenem by renal.   ROS:   Pt denies SOB, abd pain, CP, N/V/C/D, and vision changes   Objective:   MR THORACIC SPINE WO CONTRAST  Result Date: 02/12/2020 CLINICAL DATA:  Thoracic compression fracture. Osteomyelitis. EXAM: MRI THORACIC AND LUMBAR SPINE WITHOUT CONTRAST TECHNIQUE: Multiplanar and multiecho pulse sequences of the thoracic and lumbar spine were obtained without intravenous contrast. COMPARISON:  None. FINDINGS: MRI THORACIC SPINE FINDINGS Alignment:  Normal Vertebrae: Posterior instrumented spinal fusion beginning at T11. Vertebrae are otherwise normal. Cord:  Normal Paraspinal and other soft tissues: Bilateral pleural effusions Disc levels: No spinal canal or neural foraminal stenosis. MRI LUMBAR SPINE FINDINGS Segmentation:  Standard. Alignment:  Physiologic. Vertebrae: Susceptibility artifact from spinal hardware obscures the T12-L2 levels and to a lesser extent the T11 and L3 levels. No visible bone marrow abnormality. Conus medullaris and cauda equina: Conus extends to the L1 level. Conus and cauda equina appear normal. Paraspinal and other soft tissues: There is edema within the posterior soft tissues. No abscess. Disc levels: Assessment is limited by susceptibility effects and mild motion. L1-2: No spinal canal stenosis. Neural foramina are obscured. L2-3: Widely patent spinal. No neural foraminal stenosis. L3-4: No spinal canal stenosis. Mild left foraminal stenosis due to asymmetric disc bulge. L4-5: Right asymmetric disc bulge with narrowing of the right lateral recess. No neural foraminal stenosis. L5-S1: No spinal canal or neural  foraminal stenosis. IMPRESSION: 1. Posterior instrumented spinal fusion at T11-L3 with corpectomy at L1. Associated susceptibility artifact that obscures the T12-L2 level and to a lesser extent T11-12 levels. Within that limitation, no visible evidence of discitis-osteomyelitis. 2. No thoracic or lumbar spinal canal stenosis. 3. Nonspecific edema within the posterior soft tissues. No abscess. 4. Bilateral pleural effusions. Electronically Signed   By: Ulyses Jarred M.D.   On: 02/12/2020 21:08   MR LUMBAR SPINE WO CONTRAST  Result Date: 02/12/2020 CLINICAL DATA:  Thoracic compression fracture. Osteomyelitis. EXAM: MRI THORACIC AND LUMBAR SPINE WITHOUT CONTRAST TECHNIQUE: Multiplanar and multiecho pulse sequences of the thoracic and lumbar spine were obtained without intravenous contrast. COMPARISON:  None. FINDINGS: MRI THORACIC SPINE FINDINGS Alignment:  Normal Vertebrae: Posterior instrumented spinal fusion beginning at T11. Vertebrae are otherwise normal. Cord:  Normal Paraspinal and other soft tissues: Bilateral pleural effusions Disc levels: No spinal canal or neural foraminal stenosis. MRI LUMBAR SPINE FINDINGS Segmentation:  Standard. Alignment:  Physiologic. Vertebrae: Susceptibility artifact from spinal hardware obscures the T12-L2 levels and to a lesser extent the T11 and L3 levels. No visible bone marrow abnormality. Conus medullaris and cauda equina: Conus extends to the L1 level. Conus and cauda equina appear normal. Paraspinal and other soft tissues: There is edema within the posterior soft tissues. No abscess. Disc levels: Assessment is limited by susceptibility effects and mild motion. L1-2: No spinal canal stenosis. Neural foramina are obscured. L2-3: Widely patent spinal. No neural foraminal stenosis. L3-4: No spinal canal stenosis. Mild left foraminal stenosis due to asymmetric disc bulge. L4-5: Right asymmetric disc bulge with narrowing of the right lateral recess. No neural foraminal  stenosis. L5-S1: No spinal canal or neural foraminal stenosis. IMPRESSION: 1. Posterior  instrumented spinal fusion at T11-L3 with corpectomy at L1. Associated susceptibility artifact that obscures the T12-L2 level and to a lesser extent T11-12 levels. Within that limitation, no visible evidence of discitis-osteomyelitis. 2. No thoracic or lumbar spinal canal stenosis. 3. Nonspecific edema within the posterior soft tissues. No abscess. 4. Bilateral pleural effusions. Electronically Signed   By: Ulyses Jarred M.D.   On: 02/12/2020 21:08   Recent Labs    02/13/20 0417 02/14/20 0416  WBC 6.3 6.5  HGB 7.5* 7.1*  HCT 25.3* 24.5*  PLT 121* 122*   Recent Labs    02/13/20 0417 02/14/20 0416  NA 136 133*  K 4.7 4.8  CL 107 105  CO2 21* 21*  GLUCOSE 135* 115*  BUN 40* 47*  CREATININE 3.36* 4.29*  CALCIUM 8.7* 8.6*    Intake/Output Summary (Last 24 hours) at 02/14/2020 1444 Last data filed at 02/14/2020 1005 Gross per 24 hour  Intake 774.75 ml  Output 1025 ml  Net -250.25 ml     Physical Exam: Vital Signs Blood pressure (!) 96/54, pulse 73, temperature 97.8 F (36.6 C), temperature source Oral, resp. rate 16, weight 87.4 kg, SpO2 94 %. Constitutional : pt more drowsy, sleepy- sitting on on BSC, having BM, NAD HEENT:conjugate gaze; very pale conjunctivae Neck: supple, L scalenes tight Cardiovascular: RRR Respiratory/Chest: less coarse- adequate air movement GI/Abdomen: soft, stable distension- gas? Ext: L>R UEs/hands and feet more swollen- pitting edema noted- no change today Psych:confused today Musculoskeletal: Brace on/in place- no other site except L scalenes Cervical back: Normal range of motion and neck supple.  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 Functional deficits secondary to osteomyelitis of vertebral of thoracolumbar region. Status post L1 corpectomy with pedicle screw fixation T11, 12, L2 and L3 01/22/2020 which require 3+ hours per day of interdisciplinary therapy in a comprehensive inpatient rehab setting.  Physiatrist is providing close team supervision and 24 hour management of active medical problems listed below.  Physiatrist and rehab team continue to assess barriers to discharge/monitor patient progress toward functional and medical goals  Care Tool:  Bathing    Body parts bathed by patient: Right arm, Left arm, Chest, Abdomen, Front perineal area, Right upper leg, Left upper leg, Face, Right lower leg, Left lower leg   Body parts bathed by helper: Buttocks     Bathing assist Assist Level: Minimal Assistance - Patient > 75%     Upper Body Dressing/Undressing Upper body dressing   What is the patient wearing?: Pull over shirt, Orthosis Orthosis activity level: Performed by helper  Upper body assist Assist Level: Minimal Assistance - Patient > 75%    Lower Body Dressing/Undressing Lower body dressing      What is the patient wearing?: Underwear/pull up, Pants     Lower body assist Assist for lower body dressing: Minimal Assistance - Patient > 75%     Toileting Toileting    Toileting assist Assist for toileting: Minimal Assistance - Patient > 75%  Transfers Chair/bed transfer  Transfers assist     Chair/bed transfer assist level: Minimal Assistance - Patient > 75%     Locomotion Ambulation   Ambulation assist      Assist level: Minimal Assistance - Patient > 75% Assistive device: Walker-rolling Max  distance: 15 ft   Walk 10 feet activity   Assist     Assist level: Minimal Assistance - Patient > 75% Assistive device: Walker-rolling   Walk 50 feet activity   Assist Walk 50 feet with 2 turns activity did not occur: Safety/medical concerns  Assist level: Contact Guard/Touching assist Assistive device: Walker-rolling    Walk 150 feet activity   Assist Walk 150 feet activity did not occur: Safety/medical concerns         Walk 10 feet on uneven surface  activity   Assist     Assist level: Moderate Assistance - Patient - 50 - 74% Assistive device: Aeronautical engineer Will patient use wheelchair at discharge?: No             Wheelchair 50 feet with 2 turns activity    Assist            Wheelchair 150 feet activity     Assist          Blood pressure (!) 96/54, pulse 73, temperature 97.8 F (36.6 C), temperature source Oral, resp. rate 16, weight 87.4 kg, SpO2 94 %.  Medical Problem List and Plan:  1. Decreased functional mobility secondary to osteomyelitis of vertebral of thoracolumbar region. Status post L1 corpectomy with pedicle screw fixation T11, 12, L2 and L3 01/22/2020. TLSO back brace donned and supine.  -patient may Shower- cover incision- might need 2 TLSOs  4/6- had fall last night- has 8/10 headache and paraspinal soreness/pain- will check CT of head  4/8- recovered from fall- HA gone- CT (-) MRI (-)  4/14- new mild T11 endplate compression fx- no hematoma  4/15- will hold d/c due to medical issues -ELOS/Goals: ~ 8-12 days- goals supervision to min assist   Continue CIR PT, OT 2. Antithrombotics:  -DVT/anticoagulation: bilateral post-tib and peroneal DVT's.    -xarelto initiated yesterday   4/3 -reviewed plan/treatment with patient   4/14- Changed to Heparin gtt because Cr 2.33- will con't until Cr improves   4/15- on Heparin gtt- Cr up to 2.93   4/16- Heprain gtt- because Cr up 3.36 -antiplatelet  therapy: N/A  3. Pain Management: Lyrica 150 mg twice daily, Lidoderm patch, Valium as needed muscle spasms as well as oxycodone as needed   4/12- although pt having spasticity, don't want to sedate her  4/13- stopped baclofen because Cr up to 1.93- don't want to confuse/sedate pt.  4/14- will hold Lyrica to dialysis levels for a few days until Cr improves   4/15- stop Cymbalta (28m daily) and decrease Lyrica to 100 mg QHS; stop Oxycodone 10 mg and con't 5 mg and change valium to 5 mg q12 hours prn due to Cr up to 4.24 4. Mood: Cymbalta 60 mg daily - 4/17- stopped-  Due to elevated Cr -antipsychotic agents: N/A  5. Neuropsych: This patient is capable of making decisions on her own behalf.  6. Skin/Wound Care: Routine skin checks  7. Fluids/Electrolytes/Nutrition: Routine in and outs with follow-up chemistries  8. ID. 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 Follow-up per infectious disease   4/13- pt's Cr keeps bumping up- not sure why but only culprit is Cefipime- will check  with ID/pharmacy if any other options?  4/14- ESR >140- con't ABX for now- will consult ID again if needed  4/15- called ID- agreed with my concerns in setting of acute urinary retention to rescan/MRI thoracic and lumbar spine; cannot do with contrast due ot elevated Cr. Will order  4/16- MRIs look OK- renal thinks pt might have new allergy to Cefipime- and is doing w/u for this.   4/17- changed Cefipime to Meropenem pharmacy to dose 9. Diabetes mellitus. Lantus insulin as directed.    CBG (last 3)  Recent Labs    02/13/20 1651 02/13/20 2102 02/14/20 0640  GLUCAP 136* 141* 107*         4/17- BGs controlled- con't meds 10. CKD stage III. Follow-up chemistries  4/2- Cr stable at 1.24- recheck Monday  4/5- Cr up to 1.61- could be due to being dry- will recheck in AM after 1 unit pRBCs  4/6- Cr down to 1.48- has improved- con't to push fluids  4/8- Cr 1.59- over last 2 weeks, running 1.2-1.6- does have  CKD Stage III- but could be IV ABX- will give IVFs low dose x 24 hours- 60cc NS  4/9- Cr down to 1.49 today after IVFs- now off IVFs- con't regimen and recheck Monday  4/13- Cr 1.93- will give blood today and likely IVFs AFTER blood.  4/14- Cr up to 2.33- likely due to urinary retention- Foley placed today- called IM early and they made multiples recs, which were followed out- Xarelto stopped- Heparin gtt started   4/15- Cr up to 2.93- called renal consult to help- also an issue due to urinary retention- placed foley yesterday  4/16- Cr up to 3.36- cefipime allergy per renal, is their concern- supposed to be on it til 5/7- renal ordered more w/u  4/17- Changed Cefipime to Meropenem- Cr up to 4.24 today- hopefully will improve with reduction in pain/mood meds and change in ABX.  11. 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 Wound care left foot as directed. Follow-up per Dr. Sharol Given  12. Acute on chronic anemia. Follow-up CBC   4/1- Hb 7.3- might need transfusion- will monitor   4/4 Recheck CBC 4/5  4/5- CBC Hb 6.7- s/p 1 unit pRBCs- will recheck in AM  4/6- Hb up to 8.1- con't regimen-   4/10: Hgb 8.0  4/13- Hb 6.8- give 1 unit pRBCs- and recheck in AM- check hemoccult and monitor plts- down to 109k  4/14- Will check daily for now- Hb up to 7.7  4/16- Hb 7.5- will monitor  4/17- Hb down to 7.1 and hemoccult is (+)- transfused 1 unit pRBCs today- IM called GI for [name] for possible GI bleed, esp in setting of DVTs/on IV Heparin.  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 Patient did have assistance at home. Has "seeing eye dog" and husband  41. Constipation. Continue Senokot. MiraLAX as needed. No nausea vomiting   4/13- d/c imodium and start Metamucil per pt request- is appropriate- will also check Hemoccult- since dropping Hb.,   4/14- LBM 2 days ago- nursing to work on.   4/16- LBM 2 days ago- nursing working on with prns  4/17-  BM this AM 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 Fall overnight with new confusion  4/6- getting head CT and monitor- strength unchanged- appears in pain and tremors slightly worse- no other changes  4/7- MRI of brain  negative for anything acute- still confused per staff- will make baclofen prn; also could be from IV ABX per pharmacist- will monitor closely.   4/8- cognition is better today- MRI (-)  17. L heel wound, unstageable.   4/9- will consult wound care and get healing shoe to take weight off heel and wound care suggestions.  18. Pruritis: Added hydroxyzine 3m TID prn 19. Thrombocytopenia  4/13- down to 109 from 180- will recheck in AM- Needs Xarelto, so if decreases more, will call IM about plan.   4/14- as above.   4/15- Plts up to 127k- will monitor- on Heparin gtt     LOS: 16 days A FACE TO FACE EVALUATION WAS PERFORMED  Jeydan Barner 02/14/2020, 2:44 PM

## 2020-02-14 NOTE — Progress Notes (Signed)
Admit: 01/29/2020 LOS: 2  67F AoCKD3, unclear etiology; cirrhosis;   Subjective:  [name] Kitchen SCr cont to worsen; K and HCO3 ok . Nonoliguric but UOP not recored; at least 357m in foley bag . 1-2+ LEE . 4/14 renal [place] for HN or obstruction . UA  6-10 W/hpf; UP/C 2.7  04/16 0701 - 04/17 0700 In: 976.8 [P.O.:422; I.V.:454.8; IV Piggyback:100] Out: 1025 [Urine:100; Stool:925]  Filed Weights   02/11/20 1245 02/12/20 1705 02/13/20 0520  Weight: 81.7 kg 81.7 kg 87.4 kg    Scheduled Meds: . sodium chloride   Intravenous Once  . sodium chloride   Intravenous Once  . atorvastatin  10 mg Oral Daily  . bethanechol  10 mg Oral TID  . Chlorhexidine Gluconate Cloth  6 each Topical Daily  . DULoxetine  60 mg Oral Daily  . feeding supplement (GLUCERNA SHAKE)  237 mL Oral TID BM  . insulin aspart  0-9 Units Subcutaneous TID WC  . insulin glargine  35 Units Subcutaneous BID  . lidocaine  1 patch Transdermal Q24H  . multivitamin with minerals  1 tablet Oral Daily  . pregabalin  150 mg Oral QHS  . psyllium  1 packet Oral Daily  . senna  1 tablet Oral Daily  . sodium chloride flush  10-40 mL Intracatheter Q12H  . traZODone  50 mg Oral QHS   Continuous Infusions: . heparin 1,100 Units/hr (02/14/20 0208)   PRN Meds:.acetaminophen, diazepam, hydrOXYzine, ondansetron **OR** ondansetron (ZOFRAN) IV, oxyCODONE, oxyCODONE, polyethylene glycol, sodium chloride flush, sorbitol  Current Labs: reviewed  Results for MFRANCE, LUSTY(MRN [id number] as of 02/13/2020 16:04  Ref. Range 02/12/2020 14:30  Sodium, Urine Latest Units: mmol/L 44   Results for MLATRESE, CAROLAN(MRN [id number] as of 02/14/2020 12:15  Ref. Range 02/13/2020 16:30  Protein Creatinine Ratio Latest Ref Range: 0.00 - 0.15 mg/mgCre 2.73 (H)    Physical Exam:  Blood pressure 122/63, pulse 76, temperature 98 F (36.7 C), temperature source Oral, resp. rate 20, weight 87.4 kg, SpO2 94 %. Chronically ill-appearing, lying in bed,  NAD Regular, normal S1-S2 Obese, mild distention, soft, nontender 2+ lower extremity edema bilateral, symmetric Clear bilaterally, normal work of breathing Foley catheter in place Has a little bit of asterixis, otherwise nonfocal  A 1. 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  She is nonoliguric, and I do not think she is overtly uremic.  Potentially she has an interstitial nephritis from cefepime, no rashes present.  D/w RCID, change to meropenem today.  Cont foley for now.   2. 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 Longstanding DM 2 with severe microvascular disease 4. Cirrhosis 5. Anemia, transfuse prn  P . As above, . Continue supportive care . We will follow closely . Daily weights, Daily Renal Panel, Strict I/Os, Avoid nephrotoxins (NSAIDs, judicious IV Contrast)    RPearson GrippeMD 02/14/2020, 12:15 PM  Recent Labs  Lab 02/12/20 0458 02/13/20 0417 02/14/20 0416  NA 137 136 133*  K 4.4 4.7 4.8  CL 107 107 105  CO2 22 21* 21*  GLUCOSE 109* 135* 115*  BUN 37* 40* 47*  CREATININE 2.95* 3.36* 4.29*  CALCIUM 8.7* 8.7* 8.6*   Recent Labs  Lab 02/12/20 0458 02/13/20 0417 02/14/20 0416  WBC 6.3 6.3 6.5  NEUTROABS 4.2 3.9 4.0  HGB 8.1* 7.5* 7.1*  HCT 27.1* 25.3* 24.5*  MCV 89.1 90.7 90.4  PLT 127* 121* 122*

## 2020-02-14 NOTE — Significant Event (Addendum)
Rapid Response Event Note  Overview: "Second Set of Eyes"  Initial Focused Assessment: Called by 4W RN with concerns of patient being more lethargic today and overall not improving. Per nurse, patient has been more awake, has been participating in the therapies, but since Wednesday has slowly declined.   Upon arrival, [name] [name] was drowsy, she would wake and engage in conversation but then would quickly fall asleep. She appears chronically fatigued and ill overall. Her skin is quite pale, +2 edema in BUE, + 3 edema BLE, + asterixis as well - quite a bit at times while I was there. Lung sounds - coarse crackles present - with prolonged conversation, patient's breathing was labored - appeared mildly short of breath as well. Abdomen - quite distended and she stated that she as been hurting overall everywhere but mostly her back/hip region. Not in acute distress. H/H downtrending.MOE X 4 - generalized weakness in all extremities, no sensation loss.   VS --103/56 (74), HR 72, 98% on RA, RR 22, 98.0(O)  I paged TRH MD and Renal MD as well, updated them on the patient's condition. Received orders for Lasix 80 mg BID IV   Interventions: -- NO RRT INTERVENTIONS   Plan of Care: -- Transfuse per MD orders - f/u with H/H labs  -- Lasix per MD orders -- Monitor VS -- Strict I/Os -- Encourage PT/OT -- Incentive Spirometer  Clinical Recommendation: Should the patient's medical plan of care or medical conditions require more ongoing interventions, then the patient would benefit being transferred to the acute medical side.   Event Summary:  Call Time 1344 Arrival Time 1348 End Time 1500   [name] [name] R

## 2020-02-14 NOTE — Progress Notes (Addendum)
Pharmacy Antibiotic Note  Rosangela Breeze Berringer is a 64 y.o. 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 Pharmacy has been consulted for Meropenem dosing. 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 Patient's last dose of cefepime was  4/17 at 0544. Patients renal function declining very rapidly over the past few days. Scr 1.93 (on 4/13) up to 4.29 this morning. Current CrCl at 13.3 ml/min. Will monitor very closely for renal function.   Plan: Stop Cefepime Start Meropenem 1 gram IV every 12 hours (to continue through 5/7) Follow-up renal function / clinical improvement to adjust meropenem administration frequency with changes in renal function. Update OPAT orders with updated meropenem dosing once renal function has stabilized.   Weight: 87.4 kg (192 lb 10.9 oz)  Temp (24hrs), Avg:98.4 F (36.9 C), Min:97.9 F (36.6 C), Max:99.2 F (37.3 C)  Recent Labs  Lab 02/10/20 0400 02/11/20 0435 02/12/20 0458 02/13/20 0417 02/14/20 0416  WBC 6.3 6.1 6.3 6.3 6.5  CREATININE 1.93* 2.33* 2.95* 3.36* 4.29*    Estimated Creatinine Clearance (by C-G formula based on SCr of 4.29 mg/dL (H)) Female: 13.3 mL/min (A) Female: 16.3 mL/min (A)    Allergies  Allergen Reactions  . Bee Pollen Anaphylaxis  . Fish-Derived Products Hives, Shortness Of Breath, Swelling and Rash    Hives get in throat causing trouble breathing  . Mushroom Extract Complex Anaphylaxis  . Penicillins Anaphylaxis    **Tolerated cefepime March 2021 Did it involve swelling of the face/tongue/throat, SOB, or low BP? Yes Did it involve sudden or severe rash/hives, skin peeling, or any reaction on the inside of your mouth or nose? No Did you need to seek medical attention at a hospital or doctor's office? Yes When did it last happen?A few months ago If all above answers are "NO", may proceed with cephalosporin use.  [name] Kitchen Rosemary Oil Anaphylaxis  . Shellfish  Allergy Hives, Shortness Of Breath, Swelling and Rash  . Tomato Hives and Shortness Of Breath    Hives in throat causes her trouble breathing  . Acetaminophen Other (See Comments)    GI upset  . Acyclovir And Related Other (See Comments)    Unknown rxn  . Aloe Vera Hives  . Broccoli [Brassica Oleracea] Hives  . Naproxen Other (See Comments)    Unknown rxn    Antimicrobials this admission: Levofloxacin 3/25 >> 3/26 Vancomycin 3/25 >> 3/27 Rifampin 3/26 >> 3/27 Cefepime 3/27 >> 4/17 Meropenem 4/17 >> (5/7)  Microbiology this admission: 3/23 BCx >> neg 3/23 COVID negative 3/24 MRSA PCR negative 3/25 Bone bx > rare Pseudomonas, no resistance, sensitive to all.   Thank you for the interesting consult and for involving pharmacy in this patient's care.  Tamela Gammon, PharmD, BCPS 02/14/2020 12:23 PM PGY-2 Pharmacy Administration Resident Please check [email or web address] for unit-specific pharmacist phone numbers
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
## 2020-02-14 NOTE — Progress Notes (Signed)
ANTICOAGULATION CONSULT NOTE - Follow Up Consult  Pharmacy Consult for Xarelto>>IV heparin Indication: DVT  Allergies  Allergen Reactions  . Bee Pollen Anaphylaxis  . Fish-Derived Products Hives, Shortness Of Breath, Swelling and Rash    Hives get in throat causing trouble breathing  . Mushroom Extract Complex Anaphylaxis  . Penicillins Anaphylaxis    **Tolerated cefepime March 2021 Did it involve swelling of the face/tongue/throat, SOB, or low BP? Yes Did it involve sudden or severe rash/hives, skin peeling, or any reaction on the inside of your mouth or nose? No Did you need to seek medical attention at a hospital or doctor's office? Yes When did it last happen?A few months ago If all above answers are "NO", may proceed with cephalosporin use.  [name] Kitchen Rosemary Oil Anaphylaxis  . Shellfish Allergy Hives, Shortness Of Breath, Swelling and Rash  . Tomato Hives and Shortness Of Breath    Hives in throat causes her trouble breathing  . Acetaminophen Other (See Comments)    GI upset  . Acyclovir And Related Other (See Comments)    Unknown rxn  . Aloe Vera Hives  . Broccoli [Brassica Oleracea] Hives  . Naproxen Other (See Comments)    Unknown rxn    Patient Measurements: Wt: 81.7 kg   Vital Signs: Temp: 98 F (36.7 C) (04/17 0504) Temp Source: Oral (04/17 0504) BP: 122/63 (04/17 0504) Pulse Rate: 76 (04/17 0504)  Labs: Recent Labs    02/11/20 1534 02/11/20 1534 02/12/20 0458 02/12/20 0458 02/12/20 1413 02/13/20 0025 02/13/20 0417 02/14/20 0416  HGB  --   --  8.1*   < >  --   --  7.5* 7.1*  HCT  --   --  27.1*  --   --   --  25.3* 24.5*  PLT  --   --  127*  --   --   --  121* 122*  APTT 62*   < > 75*   < > 48* 85*  --  75*  HEPARINUNFRC >2.20*  --   --   --   --   --  1.38* 0.98*  CREATININE  --   --  2.95*  --   --   --  3.36* 4.29*   < > = values in this interval not displayed.    Estimated Creatinine Clearance (by C-G formula based on SCr of 4.29 mg/dL  (H)) Female: 13.3 mL/min (A) Female: 16.3 mL/min (A)    Assessment: 80 YOF with new bilateral DVTs. Pharmacy consulted for Xarelto. Of note, patient fell recently but CT head yesterday was negative.   Her scr has worsened over the past few days. Xarelto is not recommended in this situation. We are going to switch her to IV heparin instead of lovenox due to her low baseline hgb after discussion with Dr. Ranell Patrick. We will monitor with PTT for now due to her Xarelto affecting the heparin levels. Last dose of Xarelto 4/14.  PTT therapeutic (75 sec) on gtt at 1100 units/hr. HL remains elevated 0.98. Will continue to follow aPTT. CBC stable. No bleeding reported per RN.   Goal of Therapy:  Heparin level 0.3-0.7 units/ml APTT 66-102 sec Monitor platelets by anticoagulation protocol: Yes   Plan:  Continue heparin at 1100 units/hr  Daily HL and PTT Monitor for s/sx of bleeding  Lorel Monaco, PharmD PGY1 Ambulatory Care Resident [employer] # [phone]

## 2020-02-15 ENCOUNTER — Inpatient Hospital Stay (HOSPITAL_COMMUNITY): Payer: PRIVATE HEALTH INSURANCE

## 2020-02-15 DIAGNOSIS — E8770 Fluid overload, unspecified: Secondary | ICD-10-CM

## 2020-02-15 LAB — BASIC METABOLIC PANEL
Anion gap: 7 (ref 5–15)
BUN: 54 mg/dL — ABNORMAL HIGH (ref 8–23)
CO2: 20 mmol/L — ABNORMAL LOW (ref 22–32)
Calcium: 8.6 mg/dL — ABNORMAL LOW (ref 8.9–10.3)
Chloride: 105 mmol/L (ref 98–111)
Creatinine, Ser: 4.94 mg/dL — ABNORMAL HIGH (ref 0.44–1.00)
GFR calc Af Amer: 10 mL/min — ABNORMAL LOW (ref >60)
GFR calc non Af Amer: 9 mL/min — ABNORMAL LOW (ref >60)
Glucose, Bld: 73 mg/dL (ref 70–99)
Potassium: 5 mmol/L (ref 3.5–5.1)
Sodium: 132 mmol/L — ABNORMAL LOW (ref 135–145)

## 2020-02-15 LAB — TYPE AND SCREEN
ABO/RH(D): A NEG
Antibody Screen: NEGATIVE
Unit division: 0

## 2020-02-15 LAB — CBC WITH DIFFERENTIAL/PLATELET
Abs Immature Granulocytes: 0.13 10*3/uL — ABNORMAL HIGH (ref 0.00–0.07)
Basophils Absolute: 0 10*3/uL (ref 0.0–0.1)
Basophils Relative: 1 %
Eosinophils Absolute: 0.3 10*3/uL (ref 0.0–0.5)
Eosinophils Relative: 5 %
HCT: 27.6 % — ABNORMAL LOW (ref 36.0–46.0)
Hemoglobin: 8.5 g/dL — ABNORMAL LOW (ref 12.0–15.0)
Immature Granulocytes: 2 %
Lymphocytes Relative: 18 %
Lymphs Abs: 1.2 10*3/uL (ref 0.7–4.0)
MCH: 27.2 pg (ref 26.0–34.0)
MCHC: 30.8 g/dL (ref 30.0–36.0)
MCV: 88.5 fL (ref 80.0–100.0)
Monocytes Absolute: 1 10*3/uL (ref 0.1–1.0)
Monocytes Relative: 14 %
Neutro Abs: 4.1 10*3/uL (ref 1.7–7.7)
Neutrophils Relative %: 60 %
Platelets: 121 10*3/uL — ABNORMAL LOW (ref 150–400)
RBC: 3.12 MIL/uL — ABNORMAL LOW (ref 3.87–5.11)
RDW: 16.2 % — ABNORMAL HIGH (ref 11.5–15.5)
WBC: 6.7 10*3/uL (ref 4.0–10.5)
nRBC: 0.3 % — ABNORMAL HIGH (ref 0.0–0.2)

## 2020-02-15 LAB — MAGNESIUM: Magnesium: 2.4 mg/dL (ref 1.7–2.4)

## 2020-02-15 LAB — HEPARIN LEVEL (UNFRACTIONATED): Heparin Unfractionated: 0.47 IU/mL (ref 0.30–0.70)

## 2020-02-15 LAB — C3 COMPLEMENT: C3 Complement: 150 mg/dL (ref 82–167)

## 2020-02-15 LAB — BPAM RBC
Blood Product Expiration Date: 202105012359
ISSUE DATE / TIME: 202104171446
Unit Type and Rh: 600

## 2020-02-15 LAB — GLUCOSE, CAPILLARY
Glucose-Capillary: 71 mg/dL (ref 70–99)
Glucose-Capillary: 67 mg/dL — ABNORMAL LOW (ref 70–99)
Glucose-Capillary: 68 mg/dL — ABNORMAL LOW (ref 70–99)
Glucose-Capillary: 86 mg/dL (ref 70–99)
Glucose-Capillary: 123 mg/dL — ABNORMAL HIGH (ref 70–99)
Glucose-Capillary: 114 mg/dL — ABNORMAL HIGH (ref 70–99)

## 2020-02-15 LAB — C4 COMPLEMENT: Complement C4, Body Fluid: 28 mg/dL (ref 12–38)

## 2020-02-15 LAB — APTT: aPTT: 69 seconds — ABNORMAL HIGH (ref 24–36)

## 2020-02-15 MED ORDER — INSULIN GLARGINE 100 UNIT/ML ~~LOC~~ SOLN
25.0000 [IU] | Freq: Two times a day (BID) | SUBCUTANEOUS | Status: DC
  Filled 2020-02-15: qty 0.25

## 2020-02-15 MED ORDER — SODIUM CHLORIDE 0.9 % NICU IV INFUSION SIMPLE
INJECTION | INTRAVENOUS | Status: DC
  Filled 2020-02-15 (×3): qty 500

## 2020-02-15 MED ORDER — PREGABALIN 75 MG PO CAPS
75.0000 mg | ORAL_CAPSULE | Freq: Every day | ORAL | Status: DC
  Administered 2020-02-15 – 2020-02-17 (×3): 75 mg via ORAL
  Filled 2020-02-15 (×3): qty 1

## 2020-02-15 MED ORDER — INSULIN GLARGINE 100 UNIT/ML ~~LOC~~ SOLN
22.0000 [IU] | Freq: Two times a day (BID) | SUBCUTANEOUS | Status: DC
  Administered 2020-02-15: 22 [IU] via SUBCUTANEOUS
  Filled 2020-02-15 (×3): qty 0.22

## 2020-02-15 NOTE — Progress Notes (Signed)
[facility] PHYSICAL MEDICINE & REHABILITATION PROGRESS NOTE   Subjective/Complaints:   Pt reports feeling less confused today- thought was in [place] yesterday- knows where she is today.  Frustrated because her hands are shaking and makes it difficult to feed herself.   ROS:   Pt denies SOB, abd pain, CP, N/V/C/D, and vision changes  Objective:   DG Chest Port 1 View  Result Date: 02/14/2020 CLINICAL DATA:  Dyspnea. EXAM: PORTABLE CHEST 1 VIEW COMPARISON:  01/22/2020 FINDINGS: Interval enlarged cardiac silhouette, mild increase in prominence of the pulmonary vasculature and minimal increase in prominence of the interstitial markings. Interval small amount of linear density at the left lung base. Minimal fluid in the minor fissure. Thoracic spine degenerative changes and thoracolumbar spine fixation hardware. Old, healed left clavicle fracture. IMPRESSION: 1. Interval cardiomegaly and minimal changes of congestive heart failure. 2. Interval small amount of linear atelectasis at the left lung base. Electronically Signed   By: Claudie Revering M.D.   On: 02/14/2020 16:30   Recent Labs    02/14/20 0416 02/15/20 0353  WBC 6.5 6.7  HGB 7.1* 8.5*  HCT 24.5* 27.6*  PLT 122* 121*   Recent Labs    02/14/20 0416 02/15/20 0353  NA 133* 132*  K 4.8 5.0  CL 105 105  CO2 21* 20*  GLUCOSE 115* 73  BUN 47* 54*  CREATININE 4.29* 4.94*  CALCIUM 8.6* 8.6*    Intake/Output Summary (Last 24 hours) at 02/15/2020 1353 Last data filed at 02/15/2020 1300 Gross per 24 hour  Intake 1114.91 ml  Output 2075 ml  Net -960.09 ml     Physical Exam: Vital Signs Blood pressure (!) 117/59, pulse 70, temperature 97.6 F (36.4 C), temperature source Oral, resp. rate 20, weight 87.4 kg, SpO2 97 %. Constitutional : pt more awake, sitting up in bed; crumbs on chest, nurse in room, NAD HEENT:conjugate gaze;  Neck: supple, L scalenes still tight Cardiovascular: RRR Respiratory/Chest: barely coarse- good air  movement B/L GI/Abdomen: soft, stable distended; gas? Hyperactive BS Ext: L>R UEs/hands and feet still swollen, stable to slightly less-  Psych:was more awake, less confused this AM; frustrated due to interaction with staff/doctor this AM Musculoskeletal: brace off since in bed.  Comments: RUE- biceps 4/5, triceps 4/5, WE 4/5, grip 4-/5, finger abd 3+/5- all strength rechecked and unchanged from last check LUE- biceps 4+/5, triceps 4+/5, WE 5-/5, grip 4+/5, finger abd 4+/5 (hx of R elbow nerve injury/5 surgeries- with associated scars) RLE- HF 4+/5, KE/KF 5-/5, DF/PF 5-/5 LLE_ HF 4+/5, KE/KF 5-/5, DF/PF 5-/5--- Missing R 5th ray of foot- surgical- scar Neurological: Ox3 this AM Skin:  Back incision CDI  L heel ulcer- bandaged C/D/I Healed scars on R elbow and R foot Psychiatric- anxious- tearful at times; more alert, appropriate    Assessment/Plan: 1. Functional deficits secondary to osteomyelitis of vertebral of thoracolumbar region. Status post L1 corpectomy with pedicle screw fixation T11, 12, L2 and L3 01/22/2020 which require 3+ hours per day of interdisciplinary therapy in a comprehensive inpatient rehab setting.  Physiatrist is providing close team supervision and 24 hour management of active medical problems listed below.  Physiatrist and rehab team continue to assess barriers to discharge/monitor patient progress toward functional and medical goals  Care Tool:  Bathing    Body parts bathed by patient: Right arm, Left arm, Chest, Abdomen, Front perineal area, Right upper leg, Left upper leg, Face, Right lower leg, Left lower leg   Body parts bathed by helper:  Buttocks     Bathing assist Assist Level: Minimal Assistance - Patient > 75%     Upper Body Dressing/Undressing Upper body dressing   What is the patient wearing?: Pull over shirt, Orthosis Orthosis activity level: Performed by helper  Upper body assist Assist Level: Minimal Assistance - Patient > 75%    Lower  Body Dressing/Undressing Lower body dressing      What is the patient wearing?: Underwear/pull up, Pants     Lower body assist Assist for lower body dressing: Minimal Assistance - Patient > 75%     Toileting Toileting    Toileting assist Assist for toileting: Minimal Assistance - Patient > 75%     Transfers Chair/bed transfer  Transfers assist     Chair/bed transfer assist level: Minimal Assistance - Patient > 75%     Locomotion Ambulation   Ambulation assist      Assist level: Minimal Assistance - Patient > 75% Assistive device: Walker-rolling Max distance: 15 ft   Walk 10 feet activity   Assist     Assist level: Minimal Assistance - Patient > 75% Assistive device: Walker-rolling   Walk 50 feet activity   Assist Walk 50 feet with 2 turns activity did not occur: Safety/medical concerns  Assist level: Contact Guard/Touching assist Assistive device: Walker-rolling    Walk 150 feet activity   Assist Walk 150 feet activity did not occur: Safety/medical concerns         Walk 10 feet on uneven surface  activity   Assist     Assist level: Moderate Assistance - Patient - 50 - 74% Assistive device: Aeronautical engineer Will patient use wheelchair at discharge?: No             Wheelchair 50 feet with 2 turns activity    Assist            Wheelchair 150 feet activity     Assist          Blood pressure (!) 117/59, pulse 70, temperature 97.6 F (36.4 C), temperature source Oral, resp. rate 20, weight 87.4 kg, SpO2 97 %.  Medical Problem List and Plan:  1. Decreased functional mobility secondary to osteomyelitis of vertebral of thoracolumbar region. Status post L1 corpectomy with pedicle screw fixation T11, 12, L2 and L3 01/22/2020. TLSO back brace donned and supine.  -patient may Shower- cover incision- might need 2 TLSOs  4/6- had fall last night- has 8/10 headache and paraspinal soreness/pain-  will check CT of head  4/8- recovered from fall- HA gone- CT (-) MRI (-)  4/14- new mild T11 endplate compression fx- no hematoma  4/15- will hold d/c due to medical issues -ELOS/Goals: ~ 8-12 days- goals supervision to min assist   Continue CIR PT, OT 2. Antithrombotics:  -DVT/anticoagulation: bilateral post-tib and peroneal DVT's.    -xarelto initiated yesterday   4/3 -reviewed plan/treatment with patient   4/14- Changed to Heparin gtt because Cr 2.33- will con't until Cr improves   4/15- on Heparin gtt- Cr up to 2.93   4/16- Heprain gtt- because Cr up 3.36 -antiplatelet therapy: N/A  3. Pain Management: Lyrica 150 mg twice daily, Lidoderm patch, Valium as needed muscle spasms as well as oxycodone as needed   4/12- although pt having spasticity, don't want to sedate her  4/13- stopped baclofen because Cr up to 1.93- don't want to confuse/sedate pt.  4/14- will hold Lyrica to dialysis levels for a few days until  Cr improves   4/17- stop Cymbalta (21m daily) and decrease Lyrica to 100 mg QHS; stop Oxycodone 10 mg and con't 5 mg and change valium to 5 mg q12 hours prn due to Cr up to 4.24  4/18- more alert, awake today- con't regimen for now 4. Mood: Cymbalta 60 mg daily - 4/17- stopped-  Due to elevated Cr -antipsychotic agents: N/A  5. Neuropsych: This patient is capable of making decisions on her own behalf.  6. Skin/Wound Care: Routine skin checks  7. Fluids/Electrolytes/Nutrition: Routine in and outs with follow-up chemistries  8. ID. 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 Follow-up per infectious disease   4/13- pt's Cr keeps bumping up- not sure why but only culprit is Cefipime- will check with ID/pharmacy if any other options?  4/14- ESR >140- con't ABX for now- will consult ID again if needed  4/15- called ID- agreed with my concerns in setting of acute urinary retention to rescan/MRI thoracic and lumbar spine; cannot do with contrast due ot elevated Cr. Will  order  4/16- MRIs look OK- renal thinks pt might have new allergy to Cefipime- and is doing w/u for this.   4/17- changed Cefipime to Meropenem pharmacy to dose  4/18- Cr still heading up- Cr 4.94- BUN 54-  9. Diabetes mellitus. Lantus insulin as directed.    CBG (last 3)  Recent Labs    02/15/20 1146 02/15/20 1220 02/15/20 1314  GLUCAP 67* 68* 86         4/18- BGs low- will change lantus from 25 to 22 units for now 10. CKD stage III. Follow-up chemistries  4/2- Cr stable at 1.24- recheck Monday  4/5- Cr up to 1.61- could be due to being dry- will recheck in AM after 1 unit pRBCs  4/6- Cr down to 1.48- has improved- con't to push fluids  4/8- Cr 1.59- over last 2 weeks, running 1.2-1.6- does have CKD Stage III- but could be IV ABX- will give IVFs low dose x 24 hours- 60cc NS  4/9- Cr down to 1.49 today after IVFs- now off IVFs- con't regimen and recheck Monday  4/13- Cr 1.93- will give blood today and likely IVFs AFTER blood.  4/14- Cr up to 2.33- likely due to urinary retention- Foley placed today- called IM early and they made multiples recs, which were followed out- Xarelto stopped- Heparin gtt started   4/15- Cr up to 2.93- called renal consult to help- also an issue due to urinary retention- placed foley yesterday  4/16- Cr up to 3.36- cefipime allergy per renal, is their concern- supposed to be on it til 5/7- renal ordered more w/u  4/17- Changed Cefipime to Meropenem- Cr up to 4.24 today- hopefully will improve with reduction in pain/mood meds and change in ABX.   4/18- Cr up to 4.94- BUN 54- per d/w Renal, she's not near dialysis at this time- they are hoping it will turn around soon.  11. 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 Wound care left foot as directed. Follow-up per Dr. DSharol Given 12. Acute on chronic anemia. Follow-up CBC   4/1- Hb 7.3- might need transfusion- will monitor    4/4 Recheck CBC 4/5  4/5- CBC Hb 6.7- s/p 1 unit pRBCs- will recheck in AM  4/6- Hb up to 8.1- con't regimen-   4/10: Hgb 8.0  4/13- Hb 6.8- give 1 unit pRBCs- and recheck in AM-  check hemoccult and monitor plts- down to 109k  4/14- Will check daily for now- Hb up to 7.7  4/16- Hb 7.5- will monitor  4/17- Hb down to 7.1 and hemoccult is (+)- transfused 1 unit pRBCs today- IM called GI for [name] for possible GI bleed, esp in setting of DVTs/on IV Heparin.  4/18- Hb better s/p transfusion today- GI wants to wait for GI EGD, etc  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 Patient did have assistance at home. Has "seeing eye dog" and husband  30. Constipation. Continue Senokot. MiraLAX as needed. No nausea vomiting   4/13- d/c imodium and start Metamucil per pt request- is appropriate- will also check Hemoccult- since dropping Hb.,   4/14- LBM 2 days ago- nursing to work on.   4/16- LBM 2 days ago- nursing working on with prns  4/17- BM this AM  4/18- LBM yesterday 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 Fall overnight with new confusion  4/6- getting head CT and monitor- strength unchanged- appears in pain and tremors slightly worse- no other changes  4/7- MRI of brain negative for anything acute- still confused per staff- will make baclofen prn; also could be from IV ABX per pharmacist- will monitor closely.   4/8- cognition is better today- MRI (-)  17. L heel wound, unstageable.   4/9- will consult wound care and get healing shoe to take weight off heel and wound care suggestions.  18. Pruritis: Added hydroxyzine 50m TID prn 19. Thrombocytopenia  4/13- down to 109 from 180- will recheck in AM- Needs Xarelto, so if decreases more, will call IM about plan.   4/14- as above.   4/15- Plts up to 127k- will monitor- on Heparin gtt  4/18- plts 121k  I spent a total of 30 minutes on care due speaking with renal and prolonged time with pt, calming her down/crying.  LOS: 17 days A FACE  TO FACE EVALUATION WAS PERFORMED  Neira Bentsen 02/15/2020, 1:53 PM

## 2020-02-15 NOTE — Progress Notes (Signed)
PROGRESS NOTE  [name] [name] ZCH:[id number] DOB: 04-22-1956   PCP: Ann Held, DO  DOA: 01/29/2020 LOS: 50  Brief Narrative / Interim history: 64 year old female with history of PTSD, HTN, fibromyalgia, DM-2, cirrhosis, CKD-3, recent hospitalization for L1 osteomyelitis/epidural abscess with Pseudomonas discharged to CIR with a plan to complete IV cefepime through 5/7.  Hospitalist service consulted on 4/14 for AKI, thrombocytopenia, acute urinary retention and anemia.  AKI continued to get worse despite IV fluid and Foley catheter placement. Renal ultrasound without significant finding other than bladder distention.  She is not on any nephrotoxic agents and was on cefepime which could rarely cause AKI.  CT abdomen and pelvis without contrast with new mild T1 superior endplate compression fracture, mild lumbar paraspinal inflammation with no discrete postoperative hematoma and distended bladder but no acute finding.  MRI of thoracic and lumbar spine without acute finding though somewhat obscured by motion.  Nephrology consulted and initially started IV fluid.  Renal function continues to get worse.  Developed some respiratory distress with vascular congestion on CXR.  IV fluid discontinued.  Started on IV Lasix 80 mg twice daily.  Infectious disease consulted.  She was switched from cefepime to meropenem through 5/7. GI consulted for anemia with positive Hemoccult and recommended monitoring with intermittent transfusion as needed.   Subjective: Seen and examined earlier this morning.  No major events overnight of this morning.  Still with pain.  Denies chest pain, shortness of breath or GI symptoms.  Good response to IV Lasix.  Creatinine uptrending.  Transfused 1 unit with appropriate response yesterday. Objective: Vitals:   02/14/20 1515 02/14/20 1821 02/14/20 1955 02/15/20 0506  BP: (!) 105/55 133/74 126/60 (!) 117/59  Pulse: 69 78 84 70  Resp: 12 20    Temp: 98.1 F (36.7  C) 97.7 F (36.5 C) (!) 97.5 F (36.4 C) 97.6 F (36.4 C)  TempSrc: Oral Oral Oral Oral  SpO2: 94% 95% 95% 97%  Weight:        Intake/Output Summary (Last 24 hours) at 02/15/2020 1324 Last data filed at 02/15/2020 0547 Gross per 24 hour  Intake 634.91 ml  Output 1275 ml  Net -640.09 ml   Filed Weights   02/11/20 1245 02/12/20 1705 02/13/20 0520  Weight: 81.7 kg 81.7 kg 87.4 kg    Examination:  GENERAL: No apparent distress.  Nontoxic. HEENT: MMM.  Vision and hearing grossly intact.  NECK: Supple.  No apparent JVD.  RESP:  No IWOB.  Fair aeration bilaterally. CVS:  RRR. Heart sounds normal.  ABD/GI/GU: Bowel sounds present. Soft. Non tender.  MSK/EXT:  Moves extremities. No apparent deformity. No edema.  SKIN: no apparent skin lesion or wound NEURO: Awake, alert and oriented appropriately.  No apparent focal neuro deficit. PSYCH: Calm. Normal affect.  Procedures:  None  Microbiology summarized: None  Assessment & Plan: AKI on CKD-3A with mild azotemia: History of renal transplant?  Baseline Cr 1.3-1.5>> 1.93>> 4.29> 4.94.  BUN 47>> 54.  Unclear etiology but concern about possible interstitial nephritis from cefepime.  Also concern about obstructive etiology given acute urinary retention but no hydronephrosis on renal ultrasound. -Nephrology managing-on IV Lasix 80 mg twice daily.  Creatinine uptrending. -Cefepime discontinued.  Started on meropenem on 4/17 -Continue indwelling Foley catheter   Fluid overload: Likely due to renal failure and IV fluids.  Developed some respiratory distress and wheezing on 4/17.  BNP marginally elevated.  CXR with pulmonary congestion.  No history of CHF.  Echo in  2017 with EF of 55 to 60%.  Diuresed 1.3 L / 24 hours.  However, renal function uptrending -IV Lasix as above per nephrology -Dialysis might be inevitable.  Acute on chronic back pain with left-sided radiculopathy in patient with history of L1 osteomyelitis/epidural abscess  status post instrumentation.  Patient underwent anterolateral corpectomy with PSIF for osteomyelitis on 01/22/2020 by neurosurgery and discharged to CIR on 4/1 on cefepime 5/7.  CRP and ESR elevated.  Imaging including CT A/P, MRI T/L without contrast nonrevealing.  No leukocytosis or fever. -Now switched to meropenem in the setting of AKI -Pain management per primary-I agree with suggesting opiate dose  -Continue PT/OT  Acute urinary retention: Could be due to medications with anticholinergic side effects such as opiates, diazepam, Atarax... but difficult situation due to her uncontrolled pain.  MRI of thoracic and lumbar spine without acute significant finding. -Continue indwelling Foley catheter -Started low-dose bethanechol.  Normocytic anemia: Baseline Hgb 11-13 before previous admission> 7.3 on discharge on 4/1> 6.7 on 4/5>1u> 8.1> 6.8 on 4/13>1u> 7.7> 8.1>> 7.1>1u>8.6.  Hemoccult positive.  Patient is on IV heparin for DVT.  Anemia panel and LDH within normal.  Very complex situation -Appreciate GI help -Continue monitoring -IVC filter?  Thrombocytopenia: Dipped at 109 on 4/13>> 127> 121.  Due to liver cirrhosis?  Relatively stable. -Continue monitoring  Uncontrolled DM-2 with CKD-3A- most recent A1c 12.5%. Recent Labs    02/15/20 1146 02/15/20 1220 02/15/20 1314  GLUCAP 67* 68* 86  -Continue SSI-thin. -Reduce Lantus from 35 to 25 units twice daily. -Continue statin.    Recent bilateral lower extremity DVT-diagnosed on 4/2.  She has been on Xarelto. 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  She is nonimmune to hepatitis B  Chronic left heel ulcer: No signs of infection -Appreciate wound care  OSA? -Would benefit from outpatient sleep study  Anxiety/depression/insomnia-could be playing a role into his uncontrolled pain -On Cymbalta, trazodone, Valium and Atarax.        DVT prophylaxis: On heparin drip  for DVT Code Status: Full code Family Communication: None at bedside.  Updated patient's husband at bedside on 4/16.  Final disposition: Per primary  Consultants:  TRH, nephrology, ID and GI    Sch Meds:  Scheduled Meds: . atorvastatin  10 mg Oral Daily  . bethanechol  10 mg Oral TID  . Chlorhexidine Gluconate Cloth  6 each Topical Daily  . feeding supplement (GLUCERNA SHAKE)  237 mL Oral TID BM  . furosemide  80 mg Intravenous BID  . insulin aspart  0-9 Units Subcutaneous TID WC  . insulin glargine  35 Units Subcutaneous BID  . lidocaine  1 patch Transdermal Q24H  . multivitamin with minerals  1 tablet Oral Daily  . pregabalin  100 mg Oral QHS  . psyllium  1 packet Oral Daily  . senna  1 tablet Oral Daily  . sodium chloride flush  10-40 mL Intracatheter Q12H  . traZODone  50 mg Oral QHS   Continuous Infusions: . sodium chloride 250 mL (02/14/20 1829)  . heparin 1,100 Units/hr (02/14/20 1328)  . meropenem (MERREM) IV 1 g (02/15/20 0858)   PRN Meds:.sodium chloride, acetaminophen, diazepam, hydrOXYzine, ondansetron **OR** ondansetron (ZOFRAN) IV, oxyCODONE, polyethylene glycol, sodium chloride flush, sorbitol  Antimicrobials: Anti-infectives (From admission, onward)   Start     Dose/Rate Route Frequency Ordered Stop   02/14/20 1530  meropenem (MERREM) 1 g in sodium chloride 0.9 % 100  mL IVPB     1 g 200 mL/hr over 30 Minutes Intravenous Every 12 hours 02/14/20 1228     02/13/20 0000  ceFEPime (MAXIPIME) IVPB  Status:  Discontinued     2 g Intravenous Every 24 hours 02/13/20 1438 02/14/20    02/12/20 0600  ceFEPIme (MAXIPIME) 2 g in sodium chloride 0.9 % 100 mL IVPB  Status:  Discontinued     2 g 200 mL/hr over 30 Minutes Intravenous Every 24 hours 02/11/20 1255 02/14/20 1127   02/10/20 0000  ceFEPime (MAXIPIME) IVPB  Status:  Discontinued     2 g Intravenous Every 12 hours 02/10/20 1450 02/13/20    01/29/20 2000  ceFEPIme (MAXIPIME) 2 g in sodium chloride 0.9 % 100 mL  IVPB  Status:  Discontinued     2 g 200 mL/hr over 30 Minutes Intravenous Every 12 hours 01/29/20 1704 02/11/20 1255       I have personally reviewed the following labs and images: CBC: Recent Labs  Lab 02/11/20 0435 02/12/20 0458 02/13/20 0417 02/14/20 0416 02/15/20 0353  WBC 6.1 6.3 6.3 6.5 6.7  NEUTROABS 3.8 4.2 3.9 4.0 4.1  HGB 7.7* 8.1* 7.5* 7.1* 8.5*  HCT 25.5* 27.1* 25.3* 24.5* 27.6*  MCV 87.9 89.1 90.7 90.4 88.5  PLT 115* 127* 121* 122* 121*   BMP &GFR Recent Labs  Lab 02/11/20 0435 02/12/20 0458 02/13/20 0417 02/14/20 0416 02/15/20 0353  NA 136 137 136 133* 132*  K 4.3 4.4 4.7 4.8 5.0  CL 106 107 107 105 105  CO2 23 22 21* 21* 20*  GLUCOSE 134* 109* 135* 115* 73  BUN 34* 37* 40* 47* 54*  CREATININE 2.33* 2.95* 3.36* 4.29* 4.94*  CALCIUM 8.5* 8.7* 8.7* 8.6* 8.6*  MG  --   --   --   --  2.4   Estimated Creatinine Clearance (by C-G formula based on SCr of 4.94 mg/dL (H)) Female: 11.6 mL/min (A) Female: 14.2 mL/min (A) Liver & Pancreas: Recent Labs  Lab 02/12/20 0458  AST 17  ALT 12  ALKPHOS 101  BILITOT 0.6  PROT 6.6  ALBUMIN 1.8*   No results for input(s): LIPASE, AMYLASE in the last 168 hours. No results for input(s): AMMONIA in the last 168 hours. Diabetic: Recent Labs    02/12/20 1419  HGBA1C 6.6*   Recent Labs  Lab 02/14/20 2113 02/15/20 0633 02/15/20 1146 02/15/20 1220 02/15/20 1314  GLUCAP 153* 71 67* 68* 86   Cardiac Enzymes: No results for input(s): CKTOTAL, CKMB, CKMBINDEX, TROPONINI in the last 168 hours. No results for input(s): PROBNP in the last 8760 hours. Coagulation Profile: No results for input(s): INR, PROTIME in the last 168 hours. Thyroid Function Tests: No results for input(s): TSH, T4TOTAL, FREET4, T3FREE, THYROIDAB in the last 72 hours. Lipid Profile: Recent Labs    02/13/20 0417  CHOL 88  HDL 21*  LDLCALC 45  TRIG 112  CHOLHDL 4.2   Anemia Panel: Recent Labs    02/12/20 1413  VITAMINB12 231    FOLATE 9.9  FERRITIN 568*  TIBC 216*  IRON 35  RETICCTPCT 3.5*   Urine analysis:    Component Value Date/Time   COLORURINE YELLOW 02/13/2020 1630   APPEARANCEUR HAZY (A) 02/13/2020 1630   LABSPEC 1.008 02/13/2020 1630   PHURINE 5.0 02/13/2020 1630   GLUCOSEU NEGATIVE 02/13/2020 1630   GLUCOSEU 500 (A) 12/03/2019 1309   HGBUR MODERATE (A) 02/13/2020 1630   HGBUR moderate 08/05/2010 1048   BILIRUBINUR NEGATIVE 02/13/2020  [place] neg 11/15/2017 1241   KETONESUR NEGATIVE 02/13/2020 1630   PROTEINUR 30 (A) 02/13/2020 1630   UROBILINOGEN >=8.0 (A) 12/03/2019 1309   NITRITE NEGATIVE 02/13/2020 1630   LEUKOCYTESUR SMALL (A) 02/13/2020 1630   Sepsis Labs: Invalid input(s): PROCALCITONIN, [place]  Microbiology: No results found for this or any previous visit (from the past 240 hour(s)).  Radiology Studies: DG Chest Port 1 View  Result Date: 02/14/2020 CLINICAL DATA:  Dyspnea. EXAM: PORTABLE CHEST 1 VIEW COMPARISON:  01/22/2020 FINDINGS: Interval enlarged cardiac silhouette, mild increase in prominence of the pulmonary vasculature and minimal increase in prominence of the interstitial markings. Interval small amount of linear density at the left lung base. Minimal fluid in the minor fissure. Thoracic spine degenerative changes and thoracolumbar spine fixation hardware. Old, healed left clavicle fracture. IMPRESSION: 1. Interval cardiomegaly and minimal changes of congestive heart failure. 2. Interval small amount of linear atelectasis at the left lung base. Electronically Signed   By: Claudie Revering M.D.   On: 02/14/2020 16:30      Chanler Mendonca T. Machias  If 7PM-7AM, please contact night-coverage [email or web address] Password [email or web address] 02/15/2020, 1:24 PM

## 2020-02-15 NOTE — Progress Notes (Signed)
Physical Therapy Weekly Progress Note  Patient Details  Name: [name] [name] MRN: [id number] Date of Birth: 13-May-1956  Beginning of progress report period: February 05, 2020 End of progress report period: February 15, 2020  Today's Date: 02/15/2020 PT Individual Time: 1540-0867, 6195-0932, and 6712-4580 PT Individual Time Calculation (min): 57 min, 9 min, and 55 min   Patient has had intermittent progression toward long term goals limited by change in medical status, including AKI and anemia, resulting in several days that the patient was unable to fully participate in therapy due to fatigue.  Patient currently requires min-mod A for transfers, mod A for bed mobility, CGA-min A for gait training up to 50 ft prior to decline in mobility and patient was unable to ambulate on this date due to functional decline. She currently has not met any of her long term goals and will continue to progress with skilled PT while her medical status is monitored in CIR.  Patient continues to demonstrate the following deficits muscle weakness, decreased cardiorespiratoy endurance, abnormal tone, unbalanced muscle activation and decreased coordination, decreased visual acuity and decreased visual perceptual skills, decreased attention, decreased awareness, decreased safety awareness and decreased memory and decreased sitting balance, decreased standing balance, decreased postural control, decreased balance strategies and difficulty maintaining precautions and therefore will continue to benefit from skilled PT intervention to increase functional independence with mobility.  Patient not progressing toward long term goals.  See goal revision..  Plan of care revisions: downgraded goals to min A-CGA for all mobility due to decline in mobility. Will continue to assess patient's progress and adjust goals as appropriate..  PT Short Term Goals Week 2:  PT Short Term Goal 1 (Week 2): STG = LTG due to ELOS. Week 3:  PT Short Term  Goal 1 (Week 3): STG = LTG due to ELOS.  Skilled Therapeutic Interventions/Progress Updates:     Session 1: Patient in bed with RN in room upon PT arrival. Patient alert and agreeable to PT session. Patient reported 6-7/10 back pain during session, RN provided pain medicine during session. PT provided repositioning, rest breaks, and distraction as pain interventions throughout session.   Therapeutic Activity: Bed Mobility: Patient performed rolling in bed with mod-min A using bed rails to doff/donn a night gown and donningTLSO brace in supine with total A. She performed supine to sit with mod A for trunk and LE managment. Provided verbal cues for performing log rolling to maintain spinal precautions. Patient sat EOB x6 min to focus on sitting balance. Initially she required mod-min A to sit EOB due to posterior lean and progressed to CGA-min A throughout. Discussed d/c planning and the need for increased assist/supervision at d/c based on current level of function. Patient stated her husband planned to take some time off, but was unsure of his plan for doing that. Transfers: Patient performed sit to/from stand x1 with a RW and x1 without AD and stand pivot x1 from the bed>BSC with mod A due to B knee buckling due to poor motor control in standing. Performed transfers better without AD today due to LE buckling. Provided verbal cues for erect posture, increased gluteal and quad activation, and sequencing. She required total A for pulling down her panties prior to sitting on the commode.  Educated on relaxation techniques and rocking to encourage BM and reduce straining while patient on the [facility].   Patient on the [facility] with TLSO braced donned with RN in the room at end of session.   Session  2: Returned to assist patient back to bed from the [facility] due to increased assist needed for transfers in previous session. Patient performed a sit to/form stand from the [facility] as above without an AD as RN provided total A  for peri-care. She then rested seated on the BSC due to fatigue before performing a stand pivot back to the bed with mod A without an AD, provided cues as above. Patient was continent of bowl and required total A for pulling up panties from [facility]. She performed sit to supine with mod A for trunk and LE management using the bed rail with cues for performing log rolling while TLSO brace was doffed with total A. Patient in bed with RN in room at end of session.   Session 3: Patient in bed upon PT arrival. Patient alert and agreeable to PT session. Patient reported mild-moderate sternal pain and moderate R UE and hand pain/burning during session, RN and MD made aware. PT provided repositioning, rest breaks, and distraction as pain interventions throughout session. Patient reported sternal pain was "achy" and TTP over L lateral sternum. Vitals: BP 128/66, HR 72, SPO2 96%.   Therapeutic Activity: Bed Mobility: Patient performed rolling to don/doff TLSO and supine to/from sit with mod A as above.  Transfers: Patient performed sit to/from stand x2 with mod-min A without an AD with increased posterior lean, LE buckling, and decreased motor control. Provided verbal cues for focusing on LE gluteal and quad activation and relaxation to prevent anxiety in standing. PT blocked B knees to prevent bucking in standing, patient tolerated standing x5-10 sec and 15 sec.   Neuromuscular Re-ed: Patient performed the following motor control and sitting balance activities sitting EOB: -sitting balance focused on finding midline with external cues for reduced posterior lean and progressing from mod A-close supervision  -seated reaching toward visual target with B UEs within BOS with close supervision, patient required increased time for this task due to UE "jerks" and tremors  Patient in bed at end of session with breaks locked, bed alarm set, and all needs within reach. Patient's husband arrived at end of session. Discussed d/c  planning and decline in function with change in medical status. He reports that he will be taking some time off work in the afternoons to provide assistance when the patient is usually alone during the day. Discussed family education sessions and scheduling another session prior to d/c due to functional decline, patient and her husband in agreement. Dr. Dagoberto Ligas, MD arrived and discussed patient's medical status with the patient and her husband, PT aided in asking questions the patient and her husband had discussed with PT prior to MD arrival.    Therapy Documentation Precautions:  Precautions Precautions: Back, Fall, Other (comment)(has had issues w/orthostatic hypotension) Precaution Comments: Pt instructed in back precautions, and to self-monitor for activity tolerance, Required Braces or Orthoses: Spinal Brace Spinal Brace: Thoracolumbosacral orthotic, Applied in supine position Restrictions Weight Bearing Restrictions: No   Therapy/Group: Individual Therapy  Isack Lavalley L Bevan Disney PT, DPT  02/15/2020, 3:48 PM

## 2020-02-15 NOTE — Progress Notes (Signed)
Admit: 01/29/2020 LOS: 57  46F AoCKD3, unclear etiology; cirrhosis;   Subjective:  [name] Kitchen SCr cont to worsen; K and HCO3 ok . Decent UOP . No acute c/o . GI following for ? GIB, occult + and Hb down-trending . On meropenem  04/17 0701 - 04/18 0700 In: 784.9 [P.O.:240; I.V.:229.9; Blood:315] Out: 1275 [JOINO:[id number]]  Filed Weights   02/11/20 1245 02/12/20 1705 02/13/20 0520  Weight: 81.7 kg 81.7 kg 87.4 kg    Scheduled Meds: . atorvastatin  10 mg Oral Daily  . bethanechol  10 mg Oral TID  . Chlorhexidine Gluconate Cloth  6 each Topical Daily  . feeding supplement (GLUCERNA SHAKE)  237 mL Oral TID BM  . furosemide  80 mg Intravenous BID  . insulin aspart  0-9 Units Subcutaneous TID WC  . insulin glargine  25 Units Subcutaneous BID  . lidocaine  1 patch Transdermal Q24H  . multivitamin with minerals  1 tablet Oral Daily  . pregabalin  100 mg Oral QHS  . psyllium  1 packet Oral Daily  . senna  1 tablet Oral Daily  . sodium chloride flush  10-40 mL Intracatheter Q12H  . traZODone  50 mg Oral QHS   Continuous Infusions: . sodium chloride 250 mL (02/14/20 1829)  . heparin 1,100 Units/hr (02/14/20 1328)  . meropenem (MERREM) IV 1 g (02/15/20 0858)   PRN Meds:.sodium chloride, acetaminophen, diazepam, hydrOXYzine, ondansetron **OR** ondansetron (ZOFRAN) IV, oxyCODONE, polyethylene glycol, sodium chloride flush, sorbitol  Current Labs: reviewed  Results for CERRIA, RANDHAWA (MRN [id number]) as of 02/13/2020 16:04  Ref. Range 02/12/2020 14:30  Sodium, Urine Latest Units: mmol/L 44   Results for MARLENNE, RIDGE (MRN [id number]) as of 02/14/2020 12:15  Ref. Range 02/13/2020 16:30  Protein Creatinine Ratio Latest Ref Range: 0.00 - 0.15 mg/mgCre 2.73 (H)   Pending C3 and C4  Physical Exam:  Blood pressure (!) 117/59, pulse 70, temperature 97.6 F (36.4 C), temperature source Oral, resp. rate 20, weight 87.4 kg, SpO2 97 %. Chronically ill-appearing, lying in bed, NAD Regular, normal  S1-S2 Obese, mild distention, soft, nontender 2+ lower extremity edema bilateral, symmetric Clear bilaterally, normal work of breathing Foley catheter in place Has a little bit of asterixis, otherwise nonfocal  A 1. 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  She is nonoliguric, and I do not think she is overtly uremic.  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  No RRT indicated currenlty 2. 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 Longstanding DM 2 with severe microvascular disease 4. Cirrhosis 5. Anemia, transfuse prn  P . As above, cont supportive care . We will follow closely . Daily weights, Daily Renal Panel, Strict I/Os, Avoid nephrotoxins (NSAIDs, judicious IV Contrast)    Pearson Grippe MD 02/15/2020, 1:38 PM  Recent Labs  Lab 02/13/20 0417 02/14/20 0416 02/15/20 0353  NA 136 133* 132*  K 4.7 4.8 5.0  CL 107 105 105  CO2 21* 21* 20*  GLUCOSE 135* 115* 73  BUN 40* 47* 54*  CREATININE 3.36* 4.29* 4.94*  CALCIUM 8.7* 8.6* 8.6*   Recent Labs  Lab 02/13/20 0417 02/14/20 0416 02/15/20 0353  WBC 6.3 6.5 6.7  NEUTROABS 3.9 4.0 4.1  HGB 7.5* 7.1* 8.5*  HCT 25.3* 24.5* 27.6*  MCV 90.7 90.4 88.5  PLT 121* 122* 121*

## 2020-02-15 NOTE — Progress Notes (Addendum)
ANTICOAGULATION CONSULT NOTE - Follow Up Consult  Pharmacy Consult for Xarelto>>IV heparin Indication: DVT  Allergies  Allergen Reactions  . Bee Pollen Anaphylaxis  . Fish-Derived Products Hives, Shortness Of Breath, Swelling and Rash    Hives get in throat causing trouble breathing  . Mushroom Extract Complex Anaphylaxis  . Penicillins Anaphylaxis    **Tolerated cefepime March 2021 Did it involve swelling of the face/tongue/throat, SOB, or low BP? Yes Did it involve sudden or severe rash/hives, skin peeling, or any reaction on the inside of your mouth or nose? No Did you need to seek medical attention at a hospital or doctor's office? Yes When did it last happen?A few months ago If all above answers are "NO", may proceed with cephalosporin use.  [name] Kitchen Rosemary Oil Anaphylaxis  . Shellfish Allergy Hives, Shortness Of Breath, Swelling and Rash  . Tomato Hives and Shortness Of Breath    Hives in throat causes her trouble breathing  . Acetaminophen Other (See Comments)    GI upset  . Acyclovir And Related Other (See Comments)    Unknown rxn  . Aloe Vera Hives  . Broccoli [Brassica Oleracea] Hives  . Naproxen Other (See Comments)    Unknown rxn    Patient Measurements: Wt: 81.7 kg   Vital Signs: Temp: 97.6 F (36.4 C) (04/18 0506) Temp Source: Oral (04/18 0506) BP: 117/59 (04/18 0506) Pulse Rate: 70 (04/18 0506)  Labs: Recent Labs    02/13/20 0025 02/13/20 0417 02/13/20 0417 02/14/20 0416 02/15/20 0353  HGB  --  7.5*   < > 7.1* 8.5*  HCT  --  25.3*  --  24.5* 27.6*  PLT  --  121*  --  122* 121*  APTT 85*  --   --  75* 69*  HEPARINUNFRC  --  1.38*  --  0.98* 0.47  CREATININE  --  3.36*  --  4.29* 4.94*   < > = values in this interval not displayed.    Estimated Creatinine Clearance (by C-G formula based on SCr of 4.94 mg/dL (H)) Female: 11.6 mL/min (A) Female: 14.2 mL/min (A)    Assessment: 45 YOF with new bilateral DVTs. Pharmacy consulted for Xarelto. Of  note, patient fell recently but CT head yesterday was negative.   Her scr has worsened over the past few days. Xarelto is not recommended in this situation. We are going to switch her to IV heparin instead of lovenox due to her low baseline hgb after discussion with Dr. Ranell Patrick. We will monitor with PTT for now due to her Xarelto affecting the heparin levels. Last dose of Xarelto 4/14.  HL 0.47 and aPTT 69 both therapeutic on gtt at 1100 units/hr. Will follow HL now. Hgb 8.5 s/p 1 unit PRBC on 4/17 and Heme positive stool. Spoke with nurse, no active bleeding and patient unstable for EGD or colonoscopy at the moment due to new onset upper airway wheezing and limited mobility. Monitor Hgb closely.    Goal of Therapy:  Heparin level 0.3-0.7 units/ml Monitor platelets by anticoagulation protocol: Yes   Plan:  Continue heparin at 1100 units/hr  Daily HL and CBC Monitor for s/sx of bleeding  Lorel Monaco, PharmD PGY1 Ambulatory Care Resident [employer] # [phone]

## 2020-02-15 NOTE — Progress Notes (Signed)
Progress Note for [name]  Subjective: No complaints.  Objective: Vital signs in last 24 hours: Temp:  [97.5 F (36.4 C)-98.1 F (36.7 C)] 97.6 F (36.4 C) (04/18 0506) Pulse Rate:  [69-84] 70 (04/18 0506) Resp:  [12-20] 20 (04/17 1821) BP: (96-133)/(54-74) 117/59 (04/18 0506) SpO2:  [94 %-97 %] 97 % (04/18 0506) Last BM Date: 02/14/20  Intake/Output from previous day: 04/17 0701 - 04/18 0700 In: 784.9 [P.O.:240; I.V.:229.9; Blood:315] Out: 6440 [[id number]] Intake/Output this shift: No intake/output data recorded.  General appearance: alert and no distress Resp: clear to auscultation bilaterally Cardio: regular rate and rhythm GI: soft, non-tender; bowel sounds normal; no masses,  no organomegaly Extremities: extremities normal, atraumatic, no cyanosis or edema  Lab Results: Recent Labs    02/13/20 0417 02/14/20 0416 02/15/20 0353  WBC 6.3 6.5 6.7  HGB 7.5* 7.1* 8.5*  HCT 25.3* 24.5* 27.6*  PLT 121* 122* 121*   BMET Recent Labs    02/13/20 0417 02/14/20 0416 02/15/20 0353  NA 136 133* 132*  K 4.7 4.8 5.0  CL 107 105 105  CO2 21* 21* 20*  GLUCOSE 135* 115* 73  BUN 40* 47* 54*  CREATININE 3.36* 4.29* 4.94*  CALCIUM 8.7* 8.6* 8.6*   LFT No results for input(s): PROT, ALBUMIN, AST, ALT, ALKPHOS, BILITOT, BILIDIR, IBILI in the last 72 hours. PT/INR No results for input(s): LABPROT, INR in the last 72 hours. Hepatitis Panel Recent Labs    02/12/20 1418  HEPBSAG NON REACTIVE  HCVAB NON REACTIVE  HEPAIGM NON REACTIVE  HEPBIGM NON REACTIVE   C-Diff No results for input(s): CDIFFTOX in the last 72 hours. Fecal Lactopherrin No results for input(s): FECLLACTOFRN in the last 72 hours.  Studies/Results: DG Chest Port 1 View  Result Date: 02/14/2020 CLINICAL DATA:  Dyspnea. EXAM: PORTABLE CHEST 1 VIEW COMPARISON:  01/22/2020 FINDINGS: Interval enlarged cardiac silhouette, mild increase in prominence of the pulmonary vasculature and minimal increase in  prominence of the interstitial markings. Interval small amount of linear density at the left lung base. Minimal fluid in the minor fissure. Thoracic spine degenerative changes and thoracolumbar spine fixation hardware. Old, healed left clavicle fracture. IMPRESSION: 1. Interval cardiomegaly and minimal changes of congestive heart failure. 2. Interval small amount of linear atelectasis at the left lung base. Electronically Signed   By: Claudie Revering M.D.   On: 02/14/2020 16:30    Medications:  Scheduled: . atorvastatin  10 mg Oral Daily  . bethanechol  10 mg Oral TID  . Chlorhexidine Gluconate Cloth  6 each Topical Daily  . feeding supplement (GLUCERNA SHAKE)  237 mL Oral TID BM  . furosemide  80 mg Intravenous BID  . insulin aspart  0-9 Units Subcutaneous TID WC  . insulin glargine  35 Units Subcutaneous BID  . lidocaine  1 patch Transdermal Q24H  . multivitamin with minerals  1 tablet Oral Daily  . pregabalin  100 mg Oral QHS  . psyllium  1 packet Oral Daily  . senna  1 tablet Oral Daily  . sodium chloride flush  10-40 mL Intracatheter Q12H  . traZODone  50 mg Oral QHS   Continuous: . sodium chloride 250 mL (02/14/20 1829)  . heparin 1,100 Units/hr (02/14/20 1328)  . meropenem (MERREM) IV 1 g (02/15/20 0858)    Assessment/Plan: 1) Anemia. 2) Heme positive stool. 3) AKI. 4) Discitis with limited mobility.   The patient appears to be better clinically today.  Her upper airway wheezing is minimal.  Her  creatinine continues to worsen.  She responded well with the blood transfusion.  An EGD is reasonable, but there is no immediacy for the procedure.  I will discuss with Dr. Lyndel Safe about timing for any endoscopic work up.  LOS: 17 days   [name] [name] D 02/15/2020, 10:13 AM

## 2020-02-15 NOTE — Significant Event (Signed)
Hypoglycemic Event  CBG:67  Treatment: 4 oz juice/soda  Symptoms: None  Follow-up CBG: Time:1220 CBG Result:68  Possible Reasons for Event: Inadequate meal intake  Comments/MD notified: not notified, MD reviewing charts, new lantus order given    Barrett Shell

## 2020-02-15 NOTE — Significant Event (Signed)
Hypoglycemic Event  CBG: 68  Treatment: 4 oz juice/soda  Symptoms: None  Follow-up CBG: Time:1314 CBG Result: 86  Possible Reasons for Event: Inadequate meal intake  Comments/MD notified:not notified, MD on 4W, reviewed chart    [name] [name]

## 2020-02-16 ENCOUNTER — Inpatient Hospital Stay (HOSPITAL_COMMUNITY): Payer: PRIVATE HEALTH INSURANCE

## 2020-02-16 ENCOUNTER — Inpatient Hospital Stay (HOSPITAL_COMMUNITY): Payer: PRIVATE HEALTH INSURANCE | Admitting: Occupational Therapy

## 2020-02-16 DIAGNOSIS — M4625 Osteomyelitis of vertebra, thoracolumbar region: Secondary | ICD-10-CM | POA: Diagnosis not present

## 2020-02-16 DIAGNOSIS — I10 Essential (primary) hypertension: Secondary | ICD-10-CM

## 2020-02-16 DIAGNOSIS — N179 Acute kidney failure, unspecified: Secondary | ICD-10-CM | POA: Diagnosis not present

## 2020-02-16 DIAGNOSIS — E669 Obesity, unspecified: Secondary | ICD-10-CM | POA: Diagnosis not present

## 2020-02-16 LAB — GLUCOSE, CAPILLARY
Glucose-Capillary: 57 mg/dL — ABNORMAL LOW (ref 70–99)
Glucose-Capillary: 65 mg/dL — ABNORMAL LOW (ref 70–99)
Glucose-Capillary: 80 mg/dL (ref 70–99)
Glucose-Capillary: 103 mg/dL — ABNORMAL HIGH (ref 70–99)
Glucose-Capillary: 145 mg/dL — ABNORMAL HIGH (ref 70–99)
Glucose-Capillary: 125 mg/dL — ABNORMAL HIGH (ref 70–99)
Glucose-Capillary: 122 mg/dL — ABNORMAL HIGH (ref 70–99)

## 2020-02-16 LAB — CBC WITH DIFFERENTIAL/PLATELET
Abs Immature Granulocytes: 0.17 10*3/uL — ABNORMAL HIGH (ref 0.00–0.07)
Basophils Absolute: 0.1 10*3/uL (ref 0.0–0.1)
Basophils Relative: 1 %
Eosinophils Absolute: 0.4 10*3/uL (ref 0.0–0.5)
Eosinophils Relative: 6 %
HCT: 27.9 % — ABNORMAL LOW (ref 36.0–46.0)
Hemoglobin: 8.6 g/dL — ABNORMAL LOW (ref 12.0–15.0)
Immature Granulocytes: 3 %
Lymphocytes Relative: 18 %
Lymphs Abs: 1.1 10*3/uL (ref 0.7–4.0)
MCH: 27.3 pg (ref 26.0–34.0)
MCHC: 30.8 g/dL (ref 30.0–36.0)
MCV: 88.6 fL (ref 80.0–100.0)
Monocytes Absolute: 0.9 10*3/uL (ref 0.1–1.0)
Monocytes Relative: 15 %
Neutro Abs: 3.7 10*3/uL (ref 1.7–7.7)
Neutrophils Relative %: 57 %
Platelets: 124 10*3/uL — ABNORMAL LOW (ref 150–400)
RBC: 3.15 MIL/uL — ABNORMAL LOW (ref 3.87–5.11)
RDW: 16.6 % — ABNORMAL HIGH (ref 11.5–15.5)
WBC: 6.3 10*3/uL (ref 4.0–10.5)
nRBC: 0 % (ref 0.0–0.2)

## 2020-02-16 LAB — HEPARIN LEVEL (UNFRACTIONATED)
Heparin Unfractionated: 0.29 IU/mL — ABNORMAL LOW (ref 0.30–0.70)
Heparin Unfractionated: 0.43 IU/mL (ref 0.30–0.70)

## 2020-02-16 LAB — BASIC METABOLIC PANEL
Anion gap: 11 (ref 5–15)
BUN: 59 mg/dL — ABNORMAL HIGH (ref 8–23)
CO2: 20 mmol/L — ABNORMAL LOW (ref 22–32)
Calcium: 8.7 mg/dL — ABNORMAL LOW (ref 8.9–10.3)
Chloride: 103 mmol/L (ref 98–111)
Creatinine, Ser: 5.47 mg/dL — ABNORMAL HIGH (ref 0.44–1.00)
GFR calc Af Amer: 9 mL/min — ABNORMAL LOW (ref >60)
GFR calc non Af Amer: 8 mL/min — ABNORMAL LOW (ref >60)
Glucose, Bld: 63 mg/dL — ABNORMAL LOW (ref 70–99)
Potassium: 4.5 mmol/L (ref 3.5–5.1)
Sodium: 134 mmol/L — ABNORMAL LOW (ref 135–145)

## 2020-02-16 MED ORDER — AMLODIPINE 10 MG PO TAB
10 mg | ORAL_TABLET | Freq: Every day | ORAL | 3 refills | Status: AC
Start: 2020-02-16 — End: ?

## 2020-02-16 MED ORDER — GLUCOSE 40 % PO GEL
ORAL | Status: AC
  Filled 2020-02-16: qty 1

## 2020-02-16 MED ORDER — PANTOPRAZOLE SODIUM 40 MG PO TBEC
40.0000 mg | DELAYED_RELEASE_TABLET | Freq: Every day | ORAL | Status: DC
  Administered 2020-02-16 – 2020-02-18 (×3): 40 mg via ORAL
  Filled 2020-02-16 (×3): qty 1

## 2020-02-16 MED ORDER — INSULIN ASPART 100 UNIT/ML ~~LOC~~ SOLN
0.0000 [IU] | Freq: Three times a day (TID) | SUBCUTANEOUS | Status: DC
  Administered 2020-02-16 – 2020-02-18 (×4): 1 [IU] via SUBCUTANEOUS

## 2020-02-16 MED ORDER — INSULIN ASPART 100 UNIT/ML ~~LOC~~ SOLN: 0.0000 [IU] | Freq: Every day | SUBCUTANEOUS | Status: DC

## 2020-02-16 NOTE — Progress Notes (Addendum)
Physical Therapy Session Note  Patient Details  Name: Akirra Lacerda MRN: [id number] Date of Birth: 11-12-1955  Today's Date: 02/16/2020 PT Individual Time: 0801-0902 and 1310-1420 PT Individual Time Calculation (min): 61 min and 70 min  Short Term Goals: Week 3:  PT Short Term Goal 1 (Week 3): STG = LTG due to ELOS.  Skilled Therapeutic Interventions/Progress Updates:     Session 1: Patient in bed upon PT arrival. Patient alert and agreeable to PT session. Patient reported 8/10 back pain during session, RN made aware. PT provided repositioning, rest breaks, and distraction as pain interventions throughout session. Donned R TED hose and ACE wrap to L foot and calf for edema control and OH management and B non-skid socks with total A with patient in supine at beginning of session.   Therapeutic Activity: Bed Mobility: Patient performed supine to sit with CGA with use of a bed rail and increased time. Provided verbal cues and tactile cues for log roll technique to the R. Transfers: Patient performed stand pivot bed>BSC and BSC>w/c CGA-min A using a RW with L knee buckling x1. Provided verbal cues for sequencing and increased quad and gluteal activation to prevent knee buckling. Patient was unsuccessful to have a BM on BSC, however, she dressed on the [facility] with min A for UB dressing, total A for donning/doffing TLSO, and max A for LB dressing due to threading catheter through underwear and shorts and decreased balance in standing while pulling shorts and underwear up. Provided total A for peri-care after toleting.  Neuromuscular Re-education: Patient performed the following LE motor control activities in supine: -B hip/knee flexion/extension with manual resistance for improved motor control throughout ROM 2x10 -B SLR with manual facilitation and PNF tapping to quad for improved motor control, greater for eccentric lowering  Discussed d/c planning and general education about current medical  concerns prior to d/c, catheter education, and techniques for stimulating bowls, including physical activity and standing while patient on BSC.   Patient in w/c at end of session with breaks locked, seat belt alarm set, and all needs within reach.   Session 2: Patient in w/c upon PT arrival. Patient alert and agreeable to PT session. Patient reported 7/10 back pain during session, RN made aware. PT provided repositioning, rest breaks, and distraction as pain interventions throughout session. Donned L Darco shoe and R tennis shoe with total A at beginning of session. Patient very distressed about having several discussion with various doctors today. She was most concerned about having a colonoscopy procedure due to a friend dying during this procedure in the past. Comforted patient and provided general education about the procudure and benefits of having the procudre based on current medical concerns. Patient receptive and appreciative of all education provided.  Therapeutic Activity: Transfers: Patient performed sit to/from stand x1 and stand pivot w/c>mat table and w/c>BSC with min A-CGA using a RW, x2 bouts of L knee buckling without LOB. Provided verbal cues for hand placement, forward weight shift, and keeping back flat to maintain spinal precautions.  Gait Training:  Patient ambulated 25 feet using RW with min A-CGA. Ambulated with decreased B step length, decreased gait speed, decreased step height, mild forward trunk flexion, and B knee buckling x1 L>R. Provided verbal cues for erect posture, directions to avoid obstacles due to visual deficits, and increased step height for safety.  Wheelchair Mobility:  Patient was transported in the w/c with total A throughout session for energy conservation and safety due to B IV placement.  Patient on the [facility] with call bed in hand and B LEs propped on a side-lying trash can for safety/comfort at end of session, RN made aware and coming to check on patient  following PT departure. Patient with improved activity tolerance today, however, continues to require increased time and significant rest breaks due to tangential speech and general deconditioning.    Therapy Documentation Precautions:  Precautions Precautions: Back, Fall, Other (comment)(has had issues w/orthostatic hypotension) Precaution Comments: Pt instructed in back precautions, and to self-monitor for activity tolerance, Required Braces or Orthoses: Spinal Brace Spinal Brace: Thoracolumbosacral orthotic, Applied in supine position Restrictions Weight Bearing Restrictions: No    Therapy/Group: Individual Therapy  Jackie Littlejohn L Alanya Vukelich PT, DPT  02/16/2020, 4:07 PM

## 2020-02-16 NOTE — Progress Notes (Signed)
Occupational Therapy Weekly Progress Note  Patient Details  Name: [name] [name] MRN: [id number] Date of Birth: 1956/05/25  Beginning of progress report period: February 06, 2020 End of progress report period: February 16, 2020  Today's Date: 02/16/2020 OT Individual Time: 1000-1100 OT Individual Time Calculation (min): 60 min    Patient has met 2 of 8 long term goals.  Short term goals not set due to initial estimated length of stay.  Patient with ongoing medical issues affecting ability to progress in areas of balance, endurance and strength.  3 LTGs downgraded due to limited progress.  LOS has been adjusted due to medical work up.  She continues to participate in therapy sessions with focus on safety, self care, mobility and balance/strength.    Patient continues to demonstrate the following deficits: muscle weakness, decreased cardiorespiratoy endurance, decreased coordination and decreased motor planning and decreased standing balance and decreased postural control and therefore will continue to benefit from skilled OT intervention to enhance overall performance with BADL, iADL and Reduce care partner burden.  See Patient's Care Plan for progression toward long term goals.  Patient not progressing toward long term goals.  See goal revision..  Plan of care revisions:   .as documented above.  Recommend 24 hour supervision upon return home    Skilled Therapeutic Interventions/Progress Updates:    patient seated in w/c, states that pain is a "7" left hip/side which subsides with change of position.   Sit to stand and short distance ambulation with RW w/c to bed with CGA - mild ataxia LEs R>L She tolerated UB AROM/conditioning activities in unsupported sitting for 15 minutes Short distance ambulation with RW back to w/c with CGA. Completed theraputty hand exercises Standing tolerance:  5 minutes with support of RW She remained seated in w/c at close of session, seat belt alarm set and call bell  in reach.    Therapy Documentation Precautions:  Precautions Precautions: Back, Fall, Other (comment)(has had issues w/orthostatic hypotension) Precaution Comments: Pt instructed in back precautions, and to self-monitor for activity tolerance, Required Braces or Orthoses: Spinal Brace Spinal Brace: Thoracolumbosacral orthotic, Applied in supine position Restrictions Weight Bearing Restrictions: No General:   Vital Signs:    Therapy/Group: Individual Therapy  Carlos Levering 02/16/2020, 10:35 AM

## 2020-02-16 NOTE — Plan of Care (Signed)
Problem: RH Balance Goal: LTG Patient will maintain dynamic sitting balance (PT) Description: LTG:  Patient will maintain dynamic sitting balance with assistance during mobility activities (PT) Flowsheets (Taken 01/30/2020 1253 by Jerrilyn Cairo, PT) LTG: Pt will maintain dynamic sitting balance during mobility activities with:: Independent with assistive device  Goal: LTG Patient will maintain dynamic standing balance (PT) Description: LTG:  Patient will maintain dynamic standing balance with assistance during mobility activities (PT) Flowsheets (Taken 02/16/2020 1814) LTG: Pt will maintain dynamic standing balance during mobility activities with:: (downgraded goal) Contact Guard/Touching assist Note: Downgraded goal due to functional decline secondary to change in medical status.   Problem: Sit to Stand Goal: LTG:  Patient will perform sit to stand with assistance level (PT) Description: LTG:  Patient will perform sit to stand with assistance level (PT) Flowsheets (Taken 02/16/2020 1814) LTG: PT will perform sit to stand in preparation for functional mobility with assistance level: (downgraded goal) Contact Guard/Touching assist Note: Downgraded goal due to functional decline secondary to change in medical status.   Problem: RH Bed Mobility Goal: LTG Patient will perform bed mobility with assist (PT) Description: LTG: Patient will perform bed mobility with assistance, with/without cues (PT). Flowsheets (Taken 02/16/2020 1814) LTG: Pt will perform bed mobility with assistance level of: (downgraded goal) Minimal Assistance - Patient > 75% Note: With use of bed rail to simulate home set-up. Downgraded goal due to functional decline secondary to change in medical status.   Problem: RH Bed to Chair Transfers Goal: LTG Patient will perform bed/chair transfers w/assist (PT) Description: LTG: Patient will perform bed to chair transfers with assistance (PT). Flowsheets (Taken 02/16/2020  1814) LTG: Pt will perform Bed to Chair Transfers with assistance level: (downgraded goal) Contact Guard/Touching assist Note: Downgraded goal due to functional decline secondary to change in medical status.   Problem: RH Car Transfers Goal: LTG Patient will perform car transfers with assist (PT) Description: LTG: Patient will perform car transfers with assistance (PT). Flowsheets (Taken 02/16/2020 1814) LTG: Pt will perform car transfers with assist:: (downgraded goal) Contact Guard/Touching assist Note: Downgraded goal due to functional decline secondary to change in medical status.   Problem: RH Furniture Transfers Goal: LTG Patient will perform furniture transfers w/assist (OT/PT) Description: LTG: Patient will perform furniture transfers  with assistance (OT/PT). Flowsheets (Taken 02/16/2020 1814) LTG: Pt will perform furniture transfers with assist:: (downgraded goal) Contact Guard/Touching assist Note: Downgraded goal due to functional decline secondary to change in medical status.   Problem: RH Ambulation Goal: LTG Patient will ambulate in controlled environment (PT) Description: LTG: Patient will ambulate in a controlled environment, # of feet with assistance (PT). Flowsheets (Taken 02/16/2020 1814) LTG: Pt will ambulate in controlled environ  assist needed:: (downgraded goal) Contact Guard/Touching assist LTG: Ambulation distance in controlled environment: 50 ft Note: Downgraded goal due to functional decline secondary to change in medical status. Goal: LTG Patient will ambulate in home environment (PT) Description: LTG: Patient will ambulate in home environment, # of feet with assistance (PT). Flowsheets (Taken 02/16/2020 1814) LTG: Pt will ambulate in home environ  assist needed:: (downgraded goal) Contact Guard/Touching assist LTG: Ambulation distance in home environment: 20 ft Note: Downgraded goal due to functional decline secondary to change in medical status.   Problem:  RH Stairs Goal: LTG Patient will ambulate up and down stairs w/assist (PT) Description: LTG: Patient will ambulate up and down # of stairs with assistance (PT) Flowsheets (Taken 02/16/2020 1814) LTG: Pt will ambulate up/down stairs assist needed:: (  downgraded goal) Minimal Assistance - Patient > 75% LTG: Pt will  ambulate up and down number of stairs: 1+1 steps without rails using LRAD for home entry Note: Downgraded goal due to functional decline secondary to change in medical status.

## 2020-02-16 NOTE — Progress Notes (Signed)
[facility] Progress Note   Subjective:    Patient doing well this morning. Seen while working with occupational therapist, [name] [name]. She is in good spirits but notes some persistent nausea and lower left flank pain. She ate most of her french toast this morning for breakfast after experiencing a drop in her BG. Her SCr continues to worsen from 4.94 to 5.47 today but reassured patient that she had great UOP of 3.8 L yesterday, 02/15/20. She had no acute complaints today.   Objective Vitals:   02/15/20 1513 02/15/20 2042 02/16/20 0344 02/16/20 0507  BP: 132/63 132/64 124/68   Pulse: 79 79 67   Resp: 16 18 17    Temp: 97.7 F (36.5 C) 98.3 F (36.8 C) (!) 97.5 F (36.4 C)   TempSrc: Oral Oral    SpO2: 95% 95% 97%   Weight:    98 kg   Physical Exam General: Ill-appearing though pleasant female sitting up in wheel chair in back brace. Foley catheter in place. NAD.  Heart: RRR, S1/S2 distinct. No rubs.  Lungs: CTA BL. Normal respiratory effort without use of accessory mcucles.  Abdomen: BS +. Soft, mildly distended though nontender.  Extremities: Persistent 1-2+ edema of BL LE. Slight asterixis of the UE.   Filed Weights   02/12/20 1705 02/13/20 0520 02/16/20 0507  Weight: 81.7 kg 87.4 kg 98 kg    Intake/Output Summary (Last 24 hours) at 02/16/2020 1201 Last data filed at 02/16/2020 0721 Gross per 24 hour  Intake 760.65 ml  Output 3025 ml  Net -2264.35 ml    Additional Objective Labs: Basic Metabolic Panel: Recent Labs  Lab 02/14/20 0416 02/15/20 0353 02/16/20 0416  NA 133* 132* 134*  K 4.8 5.0 4.5  CL 105 105 103  CO2 21* 20* 20*  GLUCOSE 115* 73 63*  BUN 47* 54* 59*  CREATININE 4.29* 4.94* 5.47*  CALCIUM 8.6* 8.6* 8.7*   Liver Function Tests: Recent Labs  Lab 02/12/20 0458  AST 17  ALT 12  ALKPHOS 101  BILITOT 0.6  PROT 6.6  ALBUMIN 1.8*   No results for input(s): LIPASE, AMYLASE in the last 168 hours. CBC: Recent Labs  Lab 02/12/20 0458  02/12/20 0458 02/13/20 0417 02/13/20 0417 02/14/20 0416 02/15/20 0353 02/16/20 0416  WBC 6.3   < > 6.3   < > 6.5 6.7 6.3  NEUTROABS 4.2   < > 3.9   < > 4.0 4.1 3.7  HGB 8.1*   < > 7.5*   < > 7.1* 8.5* 8.6*  HCT 27.1*   < > 25.3*   < > 24.5* 27.6* 27.9*  MCV 89.1  --  90.7  --  90.4 88.5 88.6  PLT 127*   < > 121*   < > 122* 121* 124*   < > = values in this interval not displayed.   Blood Culture    Component Value Date/Time   SDES TISSUE 01/22/2020 1706   SPECREQUEST 2 LUMBAR 1 01/22/2020 1706   CULT  01/22/2020 1706    RARE PSEUDOMONAS AERUGINOSA CRITICAL RESULT CALLED TO, READ BACK BY AND VERIFIED WITH: RN [id number] FCP NO ANAEROBES ISOLATED Performed at [facility], [place] [address], [place], [place] [address]    REPTSTATUS 01/27/2020 FINAL 01/22/2020 1706    Cardiac Enzymes: No results for input(s): CKTOTAL, CKMB, CKMBINDEX, TROPONINI in the last 168 hours. CBG: Recent Labs  Lab 02/16/20 0606 02/16/20 0622 02/16/20 0650 02/16/20 0814 02/16/20 1134  GLUCAP 65* 57*  80 103* 145*   Iron Studies: No results for input(s): IRON, TIBC, TRANSFERRIN, FERRITIN in the last 72 hours. Lab Results  Component Value Date   INR 1.2 01/22/2020   INR 1.1 04/19/2019   INR 1.16 04/09/2017   Studies/Results: DG Chest Port 1 View  Result Date: 02/14/2020 CLINICAL DATA:  Dyspnea. EXAM: PORTABLE CHEST 1 VIEW COMPARISON:  01/22/2020 FINDINGS: Interval enlarged cardiac silhouette, mild increase in prominence of the pulmonary vasculature and minimal increase in prominence of the interstitial markings. Interval small amount of linear density at the left lung base. Minimal fluid in the minor fissure. Thoracic spine degenerative changes and thoracolumbar spine fixation hardware. Old, healed left clavicle fracture. IMPRESSION: 1. Interval cardiomegaly and minimal changes of congestive heart failure. 2. Interval small amount of linear atelectasis at the left lung base.  Electronically Signed   By: Claudie Revering M.D.   On: 02/14/2020 16:30    Medications: . sodium chloride 250 mL (02/14/20 1829)  . heparin 1,200 Units/hr (02/16/20 0602)  . meropenem (MERREM) IV 1 g (02/16/20 0926)   . atorvastatin  10 mg Oral Daily  . bethanechol  10 mg Oral TID  . Chlorhexidine Gluconate Cloth  6 each Topical Daily  . dextrose      . feeding supplement (GLUCERNA SHAKE)  237 mL Oral TID BM  . furosemide  80 mg Intravenous BID  . insulin aspart  0-5 Units Subcutaneous QHS  . insulin aspart  0-9 Units Subcutaneous TID WC  . lidocaine  1 patch Transdermal Q24H  . multivitamin with minerals  1 tablet Oral Daily  . pantoprazole  40 mg Oral Q0600  . pregabalin  75 mg Oral QHS  . psyllium  1 packet Oral Daily  . senna  1 tablet Oral Daily  . sodium chloride flush  10-40 mL Intracatheter Q12H  . 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 Underwent  L1 lateral corpectomy/reconstruction with cage 01/22/20 due to L1 OM with pseudomonas on cefepime. Discharged to rehab here 4/1. Noted steady rise of Cr over the past week from 1.6 to 2.95 along with urinary retention. CT A/P as well as renal US ruled out obstruction 02/11/20. Nephrology consulted. Baseline SCr noted at 1.35-1.99. Cefepime DC'd, added meropenum 4/18. UOP improving. Monitoring persistently elevated SCr. Supportive care.   1. Progressive AKI on CKD stage III  - Episode of acute urinary retention 4/14 (bladder scan with 625 mL) though CT showed no signs of obstruction. Required Foley 4/14 -- overall, nonoliguric and subnephrotic (UA 4/16 with 30 protein). - Cr worsened from 4.94 to 5.47 today but reassured by patient's 3.8 L of UOP. FeNa 4/16 was 2.2 suggesting intrinsic causes as below.  - Etiology unclear.  Differential includes acute interstial nephrtitis due to cefepime use as below vs. acute tubular necrosis from intermittent hypotension. Patient switched to meropenum yesterday, 4/18. No signs of overt uremia. Dialysis is not indicated at this time.  - T/L spine MRI ordered 4/15 to rule out CES given urinary retention, acute on chronic back pain, and elevated inflammatory markers. Revealed no thoracic or lumbar stenosis.   Recommendations: - Continue gentle IVFs and supportive care. We will follow closely.  - Continue furosemide 80 mg IV BID. Continue holding home BP meds.  - Maintain  foley until Cr plateaus. Attempt voiding trial with q6 hour bladder scans with I&O cath if >500 mL. - Obtain daily weights, daily BMP to trend SCr and electrolytes, and strict I/Os. - Avoid nephrotoxic agents including NSAIDs and IV contrast.   2. 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 Transitioned to cefepime 3/27-4/16. Recommended d/c considering potential acute interstitial nephritis as cause of kidney failure.  - Meropenum 1 g IV initiated 4/18. Planning to continue through 03/05/20.   4. Anemia of CKD - Baseline Hgb of 10. Stable at 8.6 today.  - Required pRBCs transfusion 02/10/20 when Hgb dropped to 6.8 and BP 102/54. Retic ct mildly elevated at 3.5. On 4/15 Iron 35, TIBC 216, Ferritin 568 -- stable. - Transfuse as indicated. Patient historically has not tolerated oral iron.   5. 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 HIV Ab and Hep panel non-reactive. - Per primary team.   6. Longstanding DMT2 with microvascular disease - Most recent HbA1c 12.5% - Will likely require less insulin while kidney function is poor. Continue current regimen per primary team.  7. Recent lower extremity DVT - On heparin. Continue per primary team.     [name] Sprinkles, PA-S2 [facility]

## 2020-02-16 NOTE — Progress Notes (Addendum)
Daily Rounding Note  02/16/2020, 9:12 AM  LOS: 18 days   SUBJECTIVE:   Chief complaint: Anemia, FOBT positive. Feels ok.  No abd pain.  No nausea  Not sure she wants to have colonoscopy but mostly ok w idea of EGD.  Admits to occasional nausea.     OBJECTIVE:         Vital signs in last 24 hours:    Temp:  [97.5 F (36.4 C)-98.3 F (36.8 C)] 97.5 F (36.4 C) (04/19 0344) Pulse Rate:  [67-79] 67 (04/19 0344) Resp:  [16-18] 17 (04/19 0344) BP: (124-132)/(63-68) 124/68 (04/19 0344) SpO2:  [95 %-97 %] 97 % (04/19 0344) Weight:  [98 kg] 98 kg (04/19 0507) Last BM Date: 02/15/20 Filed Weights   02/12/20 1705 02/13/20 0520 02/16/20 0507  Weight: 81.7 kg 87.4 kg 98 kg   General: looks chronically ill, pale   Heart: RRR Chest: clear.  Excellent BS Abdomen: soft, active BS, NT  Extremities: no CCE Neuro/Psych:  Oriented x 3.  Appropriate.  Slight tremor in upper body as she leaned forward.    Intake/Output from previous day: 04/18 0701 - 04/19 0700 In: 880.7 [P.O.:480; I.V.:265.3; IV Piggyback:135.4] Out: 3825 [Urine:3825]  Intake/Output this shift: Total I/O In: 120 [P.O.:120] Out: -   Lab Results: Recent Labs    02/14/20 0416 02/15/20 0353 02/16/20 0416  WBC 6.5 6.7 6.3  HGB 7.1* 8.5* 8.6*  HCT 24.5* 27.6* 27.9*  PLT 122* 121* 124*   BMET Recent Labs    02/14/20 0416 02/15/20 0353 02/16/20 0416  NA 133* 132* 134*  K 4.8 5.0 4.5  CL 105 105 103  CO2 21* 20* 20*  GLUCOSE 115* 73 63*  BUN 47* 54* 59*  CREATININE 4.29* 4.94* 5.47*  CALCIUM 8.6* 8.6* 8.7*   LFT No results for input(s): PROT, ALBUMIN, AST, ALT, ALKPHOS, BILITOT, BILIDIR, IBILI in the last 72 hours. PT/INR No results for input(s): LABPROT, INR in the last 72 hours. Hepatitis Panel No results for input(s): HEPBSAG, HCVAB, HEPAIGM, HEPBIGM in the last 72 hours.  Studies/Results: DG Chest Port 1 View  Result Date:  02/14/2020 CLINICAL DATA:  Dyspnea. EXAM: PORTABLE CHEST 1 VIEW COMPARISON:  01/22/2020 FINDINGS: Interval enlarged cardiac silhouette, mild increase in prominence of the pulmonary vasculature and minimal increase in prominence of the interstitial markings. Interval small amount of linear density at the left lung base. Minimal fluid in the minor fissure. Thoracic spine degenerative changes and thoracolumbar spine fixation hardware. Old, healed left clavicle fracture. IMPRESSION: 1. Interval cardiomegaly and minimal changes of congestive heart failure. 2. Interval small amount of linear atelectasis at the left lung base. Electronically Signed   By: Claudie Revering M.D.   On: 02/14/2020 16:30    Scheduled Meds: . atorvastatin  10 mg Oral Daily  . bethanechol  10 mg Oral TID  . Chlorhexidine Gluconate Cloth  6 each Topical Daily  . dextrose      . feeding supplement (GLUCERNA SHAKE)  237 mL Oral TID BM  . furosemide  80 mg Intravenous BID  . insulin aspart  0-5 Units Subcutaneous QHS  . insulin aspart  0-9 Units Subcutaneous TID WC  . lidocaine  1 patch Transdermal Q24H  . multivitamin with minerals  1 tablet Oral Daily  . pregabalin  75 mg Oral QHS  . psyllium  1 packet Oral Daily  . senna  1 tablet Oral Daily  . sodium chloride flush  10-40 mL Intracatheter Q12H  . traZODone  50 mg Oral QHS   Continuous Infusions: . sodium chloride 250 mL (02/14/20 1829)  . heparin 1,200 Units/hr (02/16/20 0602)  . meropenem (MERREM) IV 1 g (02/16/20 0926)   PRN Meds:.sodium chloride, acetaminophen, diazepam, hydrOXYzine, ondansetron **OR** ondansetron (ZOFRAN) IV, oxyCODONE, polyethylene glycol, sodium chloride flush, sorbitol   ASSESMENT:   *    Chronic anemia, FOBT positive.  Progressive decline Hgb fro ~ 10 - 11 >> 6.7.  Transfused PRBC x 3 thus far.  Iron and sats ok.  Low TIBC.  Ferritin 568.  B12, Folate ok.   No previous GI procedures per epic.  Saw Dr Henrene Pastor in 2015 but did not follow through w  planned colon/egd.   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  On hold, Heparin gtt in place.    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  No GI doctor per review of epic.  INR 1.2 in 01/22/20.  Hepatitis serologies negative.   CTAP with cirrhosis, mild splenomegaly.  *   Thrombocytopenia.    *     AKI.  *     Discitis T12, L2. Growing pseudomonas fron spine.  01/22/2020 spinal surgery., limited mobility.  Chronic MSSA septic arthritis/osteomyelitis of elbow.  *   Fibromyalgia, PTSD, legally blind, IDDM At baseline requires assist for ADLs.      PLAN   *   ? Timing of endoscopic procedures, ideally a double.  But pt not convinced to undergo colonoscopy.  Let her speak w Dr Lyndel Safe and make decision.  Could do dble or just EGD tmrw.     *   Added daily po Protonix.        Azucena Freed  02/16/2020, 9:12 AM Phone [phone]    Attending physician's note   I have taken an interval history, reviewed the chart and examined the patient. I agree with the Advanced Practitioner's note, impression and recommendations.   FOBT positive Chronic anemia (with anemia of chronic disease) NASH cirrhosis with mild splenomegaly. No ascites or varices on CT 01/2020 (without contrast) AKI on CKD (Cr 5.4 today).  Renal following. B/L DVTs on Xarelto.  Currently on heparin Pseudomonas discitis on meropenem, DM, fibromyalgia/PTSD  Plan: -Trend CBC. Keep Hb>7. -Ideally would need EGD/colonoscopy.  Wants to think about it.  Tentatively 4/21 (if refuses colonoscopy, then just EGD) -Appreciate renal consultation.  I do not think she has HRS in absence of ascites. -Will follow along.   Carmell Austria, MD Velora Heckler Fabienne Bruns [phone].

## 2020-02-16 NOTE — Progress Notes (Signed)
ANTICOAGULATION CONSULT NOTE - Follow Up Consult  Pharmacy Consult for heparin Indication: DVT  Allergies  Allergen Reactions  . Bee Pollen Anaphylaxis  . Fish-Derived Products Hives, Shortness Of Breath, Swelling and Rash    Hives get in throat causing trouble breathing  . Mushroom Extract Complex Anaphylaxis  . Penicillins Anaphylaxis    **Tolerated cefepime March 2021 Did it involve swelling of the face/tongue/throat, SOB, or low BP? Yes Did it involve sudden or severe rash/hives, skin peeling, or any reaction on the inside of your mouth or nose? No Did you need to seek medical attention at a hospital or doctor's office? Yes When did it last happen?A few months ago If all above answers are "NO", may proceed with cephalosporin use.  [name] Kitchen Rosemary Oil Anaphylaxis  . Shellfish Allergy Hives, Shortness Of Breath, Swelling and Rash  . Tomato Hives and Shortness Of Breath    Hives in throat causes her trouble breathing  . Acetaminophen Other (See Comments)    GI upset  . Acyclovir And Related Other (See Comments)    Unknown rxn  . Aloe Vera Hives  . Broccoli [Brassica Oleracea] Hives  . Naproxen Other (See Comments)    Unknown rxn    Patient Measurements: Wt: 81.7 kg   Vital Signs: Temp: 97.5 F (36.4 C) (04/19 0344) BP: 124/68 (04/19 0344) Pulse Rate: 67 (04/19 0344)  Labs: Recent Labs    02/14/20 0416 02/14/20 0416 02/15/20 0353 02/16/20 0416 02/16/20 1229  HGB 7.1*   < > 8.5* 8.6*  --   HCT 24.5*  --  27.6* 27.9*  --   PLT 122*  --  121* 124*  --   APTT 75*  --  69*  --   --   HEPARINUNFRC 0.98*   < > 0.47 0.29* 0.43  CREATININE 4.29*  --  4.94* 5.47*  --    < > = values in this interval not displayed.    Estimated Creatinine Clearance (by C-G formula based on SCr of 5.47 mg/dL (H)) Female: 11.1 mL/min (A) Female: 13.6 mL/min (A)  Assessment: 22 YOF with new bilateral DVTs. Pharmacy consulted for IV heparin; Xarelto discontinued for worsening renal  function. Of note, patient fell recently but CT head was negative.   Heparin level is therapeutic at 0.43 on 1200 units/hr. No bleeding noted, Hgb low stable, platelets up to 124.   Goal of Therapy:  Heparin level 0.3-0.7 units/ml Monitor platelets by anticoagulation protocol: Yes   Plan:  Continue heparin drip at 1200 units/hr  Daily heparin level and CBC Monitor for s/sx of bleeding  Thank you for involving pharmacy in this patient's care.  Renold Genta, PharmD, BCPS Clinical Pharmacist Clinical phone for 02/16/2020 until 3p is [phone] 02/16/2020 1:29 PM  **Pharmacist phone directory can be found on [email or web address] listed under [place]**

## 2020-02-16 NOTE — Progress Notes (Signed)
Spoke with pharmacy this morning Heparin gtt rate changed to 12 ml/hr. Peripheral IV line patent this shift. Blood sugar this morning 65. Will recheck in 15 min. Hypoglycemic protocol started. Will monitor.

## 2020-02-16 NOTE — Progress Notes (Signed)
ANTICOAGULATION CONSULT NOTE - Follow Up Consult  Pharmacy Consult for Xarelto>>IV heparin Indication: DVT  Allergies  Allergen Reactions  . Bee Pollen Anaphylaxis  . Fish-Derived Products Hives, Shortness Of Breath, Swelling and Rash    Hives get in throat causing trouble breathing  . Mushroom Extract Complex Anaphylaxis  . Penicillins Anaphylaxis    **Tolerated cefepime March 2021 Did it involve swelling of the face/tongue/throat, SOB, or low BP? Yes Did it involve sudden or severe rash/hives, skin peeling, or any reaction on the inside of your mouth or nose? No Did you need to seek medical attention at a hospital or doctor's office? Yes When did it last happen?A few months ago If all above answers are "NO", may proceed with cephalosporin use.  [name] Kitchen Rosemary Oil Anaphylaxis  . Shellfish Allergy Hives, Shortness Of Breath, Swelling and Rash  . Tomato Hives and Shortness Of Breath    Hives in throat causes her trouble breathing  . Acetaminophen Other (See Comments)    GI upset  . Acyclovir And Related Other (See Comments)    Unknown rxn  . Aloe Vera Hives  . Broccoli [Brassica Oleracea] Hives  . Naproxen Other (See Comments)    Unknown rxn    Patient Measurements: Wt: 81.7 kg   Vital Signs: Temp: 97.5 F (36.4 C) (04/19 0344) Temp Source: Oral (04/18 2042) BP: 124/68 (04/19 0344) Pulse Rate: 67 (04/19 0344)  Labs: Recent Labs    02/14/20 0416 02/15/20 0353 02/16/20 0416  HGB 7.1* 8.5*  --   HCT 24.5* 27.6*  --   PLT 122* 121*  --   APTT 75* 69*  --   HEPARINUNFRC 0.98* 0.47 0.29*  CREATININE 4.29* 4.94* 5.47*    Estimated Creatinine Clearance (by C-G formula based on SCr of 5.47 mg/dL (H)) Female: 11.1 mL/min (A) Female: 13.6 mL/min (A)    Assessment: 77 YOF with new bilateral DVTs. Pharmacy consulted for Xarelto. Of note, patient fell recently but CT head yesterday was negative.   Her scr has worsened over the past few days. Xarelto is not recommended in  this situation. We are going to switch her to IV heparin instead of lovenox due to her low baseline hgb after discussion with Dr. Ranell Patrick. We will monitor with PTT for now due to her Xarelto affecting the heparin levels. Last dose of Xarelto 4/14.  Heparin level down to slightly subtherapeutic (0.29) on gtt at 1100 units/hr. No issues with line or bleeding reported per RN.    Goal of Therapy:  Heparin level 0.3-0.7 units/ml Monitor platelets by anticoagulation protocol: Yes   Plan:  Increase heparin slightly to 1200 units/hr  F/u 6 hr heparin level Monitor for s/sx of bleeding  Sherlon Handing, PharmD, BCPS Please see amion for complete clinical pharmacist phone list 02/16/2020 5:58 AM

## 2020-02-16 NOTE — Progress Notes (Signed)
Hypoglycemic Event  CBG: 65  Treatment: 8 oz juice/soda  Symptoms: None  Follow-up CBG: Time:0622 CBG Result:57 Possible Reasons for Event: med. regimen  Comments/MD notified: hypoglcemic protocol continued. Will check cbg in 15 min.    [name] [name], [employer]

## 2020-02-16 NOTE — Progress Notes (Addendum)
[facility] PHYSICAL MEDICINE & REHABILITATION PROGRESS NOTE   Subjective/Complaints:   Still c/o hand shaking, pain at night which she attributes to [place]. Feels that she's more oriented  ROS: Patient denies fever, rash, sore throat, blurred vision, nausea, vomiting, diarrhea, cough, shortness of breath or chest pain,  headache, or mood change.  Objective:   DG Chest Port 1 View  Result Date: 02/14/2020 CLINICAL DATA:  Dyspnea. EXAM: PORTABLE CHEST 1 VIEW COMPARISON:  01/22/2020 FINDINGS: Interval enlarged cardiac silhouette, mild increase in prominence of the pulmonary vasculature and minimal increase in prominence of the interstitial markings. Interval small amount of linear density at the left lung base. Minimal fluid in the minor fissure. Thoracic spine degenerative changes and thoracolumbar spine fixation hardware. Old, healed left clavicle fracture. IMPRESSION: 1. Interval cardiomegaly and minimal changes of congestive heart failure. 2. Interval small amount of linear atelectasis at the left lung base. Electronically Signed   By: Claudie Revering M.D.   On: 02/14/2020 16:30   Recent Labs    02/15/20 0353 02/16/20 0416  WBC 6.7 6.3  HGB 8.5* 8.6*  HCT 27.6* 27.9*  PLT 121* 124*   Recent Labs    02/15/20 0353 02/16/20 0416  NA 132* 134*  K 5.0 4.5  CL 105 103  CO2 20* 20*  GLUCOSE 73 63*  BUN 54* 59*  CREATININE 4.94* 5.47*  CALCIUM 8.6* 8.7*    Intake/Output Summary (Last 24 hours) at 02/16/2020 1110 Last data filed at 02/16/2020 0721 Gross per 24 hour  Intake 760.65 ml  Output 3825 ml  Net -3064.35 ml     Physical Exam: Vital Signs Blood pressure 124/68, pulse 67, temperature (!) 97.5 F (36.4 C), resp. rate 17, weight 98 kg, SpO2 97 %. Constitutional: No distress . Vital signs reviewed. obese HEENT: EOMI, oral membranes moist Neck: supple Cardiovascular: RRR without murmur. No JVD    Respiratory/Chest: CTA Bilaterally without wheezes or rales. 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 CN exam intact. Reasonable insight and awareness.  Skin:  Back incision not visualized as she was in brace L heel ulcer- bandaged C/D/I Healed scars on R elbow and R foot      Assessment/Plan: 1. Functional deficits secondary to osteomyelitis of vertebral of thoracolumbar region. Status post L1 corpectomy with pedicle screw fixation T11, 12, L2 and L3 01/22/2020 which require 3+ hours per day of interdisciplinary therapy in a comprehensive inpatient rehab setting.  Physiatrist is providing close team supervision and 24 hour management of active medical problems listed below.  Physiatrist and rehab team continue to assess barriers to discharge/monitor patient progress toward functional and medical goals  Care Tool:  Bathing    Body parts bathed by patient: Right arm, Left arm, Chest, Abdomen, Front perineal area, Right upper leg, Left upper leg, Face, Right lower leg, Left lower leg   Body parts bathed by helper: Buttocks     Bathing assist Assist Level: Minimal  Assistance - Patient > 75%     Upper Body Dressing/Undressing Upper body dressing   What is the patient wearing?: Pull over shirt, Orthosis Orthosis activity level: Performed by helper  Upper body assist Assist Level: Minimal Assistance - Patient > 75%    Lower Body Dressing/Undressing Lower body dressing      What is the patient  wearing?: Underwear/pull up, Pants     Lower body assist Assist for lower body dressing: Minimal Assistance - Patient > 75%     Toileting Toileting    Toileting assist Assist for toileting: Minimal Assistance - Patient > 75%     Transfers Chair/bed transfer  Transfers assist     Chair/bed transfer assist level: Minimal Assistance - Patient > 75%     Locomotion Ambulation   Ambulation assist      Assist level: Minimal Assistance - Patient > 75% Assistive device: Walker-rolling Max distance: 15 ft   Walk 10 feet activity   Assist     Assist level: Minimal Assistance - Patient > 75% Assistive device: Walker-rolling   Walk 50 feet activity   Assist Walk 50 feet with 2 turns activity did not occur: Safety/medical concerns  Assist level: Contact Guard/Touching assist Assistive device: Walker-rolling    Walk 150 feet activity   Assist Walk 150 feet activity did not occur: Safety/medical concerns         Walk 10 feet on uneven surface  activity   Assist     Assist level: Moderate Assistance - Patient - 50 - 74% Assistive device: Aeronautical engineer Will patient use wheelchair at discharge?: No             Wheelchair 50 feet with 2 turns activity    Assist            Wheelchair 150 feet activity     Assist          Blood pressure 124/68, pulse 67, temperature (!) 97.5 F (36.4 C), resp. rate 17, weight 98 kg, SpO2 97 %.  Medical Problem List and Plan:  1. Decreased functional mobility secondary to osteomyelitis of vertebral of thoracolumbar region. Status post L1 corpectomy with pedicle screw fixation T11, 12, L2 and L3 01/22/2020. TLSO back brace donned and supine.  -patient may Shower- cover incision- might need 2 TLSOs  4/6- had fall last night- has 8/10 headache and paraspinal soreness/pain- will check CT of head  4/8- recovered from fall- HA gone- CT (-) MRI (-)  4/14- new mild T11 endplate  compression fx- no hematoma  4/15- will hold d/c due to medical issues -ELOS/Goals: ~ 8-12 days- goals supervision to min assist   Continue CIR PT, OT 2. Antithrombotics:  -DVT/anticoagulation: bilateral post-tib and peroneal DVT's.    -xarelto initiated yesterday   4/3 -reviewed plan/treatment with patient   4/14- Changed to Heparin gtt because Cr 2.33- will con't until Cr improves   4/15- on Heparin gtt- Cr up to 2.93   4/16- Heprain gtt- because Cr up 3.36 -antiplatelet therapy: N/A  3. Pain Management: Lyrica 150 mg twice daily, Lidoderm patch, Valium as needed muscle spasms as well as oxycodone as needed   4/12- although pt having spasticity, don't want to sedate her  4/13- stopped baclofen because Cr up to 1.93- don't want to confuse/sedate pt.  4/14- will hold Lyrica to dialysis levels for a few days until Cr improves   4/17- stop Cymbalta (66m daily) and decrease Lyrica to  100 mg QHS; stop Oxycodone 10 mg and con't 5 mg and change valium to 5 mg q12 hours prn due to Cr up to 4.24  4/18-19- improved arousal/cognition 4. Mood: Cymbalta 60 mg daily - 4/17- stopped-  Due to elevated Cr -antipsychotic agents: N/A  5. Neuropsych: This patient is capable of making decisions on her own behalf.  6. Skin/Wound Care: Routine skin checks  7. Fluids/Electrolytes/Nutrition: Routine in and outs with follow-up chemistries  8. ID. 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 Follow-up per infectious disease   4/13- pt's Cr keeps bumping up- not sure why but only culprit is Cefipime- will check with ID/pharmacy if any other options?  4/14- ESR >140- con't ABX for now- will consult ID again if needed  4/15- called ID- agreed with my concerns in setting of acute urinary retention to rescan/MRI thoracic and lumbar spine; cannot do with contrast due ot elevated Cr. Will order  4/16- MRIs look OK- renal thinks pt might have new allergy to Cefipime- and is doing w/u for this.   4/17- changed  Cefipime to Meropenem pharmacy to dose  4/19 Pt afebrile, WBC's normal, Cr continues to drift up  9. Diabetes mellitus. Lantus insulin as directed.    CBG (last 3)  Recent Labs    02/16/20 0622 02/16/20 0650 02/16/20 0814  GLUCAP 57* 80 103*         4/18- BGs low- will change lantus from 25 to 22 units for now  4/19 CBG's still quite low, it appears that lantus was stopped yesterday. Continue to hold 10. CKD stage III. Follow-up chemistries  4/2- Cr stable at 1.24- recheck Monday  4/5- Cr up to 1.61- could be due to being dry- will recheck in AM after 1 unit pRBCs  4/6- Cr down to 1.48- has improved- con't to push fluids  4/8- Cr 1.59- over last 2 weeks, running 1.2-1.6- does have CKD Stage III- but could be IV ABX- will give IVFs low dose x 24 hours- 60cc NS  4/9- Cr down to 1.49 today after IVFs- now off IVFs- con't regimen and recheck Monday  4/13- Cr 1.93- will give blood today and likely IVFs AFTER blood.  4/14- Cr up to 2.33- likely due to urinary retention- Foley placed today- called IM early and they made multiples recs, which were followed out- Xarelto stopped- Heparin gtt started   4/15- Cr up to 2.93- called renal consult to help- also an issue due to urinary retention- placed foley yesterday  4/16- Cr up to 3.36- cefipime allergy per renal, is their concern- supposed to be on it til 5/7- renal ordered more w/u  4/17- Changed Cefipime to Meropenem- Cr up to 4.24 today- hopefully will improve with reduction in pain/mood meds and change in ABX.   4/18- Cr up to 4.94- BUN 54- per d/w Renal, she's not near dialysis at this time- they are hoping it will turn around soon.   4/19 Cr continues to climb 5.47 today, ?interstitial nephritis, Nephro following 11. 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 Wound care left foot as directed. Follow-up per Dr. Sharol Given  12. Acute on chronic anemia.  Follow-up CBC   4/1- Hb 7.3- might need transfusion- will monitor   4/4 Recheck CBC 4/5  4/5- CBC Hb 6.7- s/p 1 unit pRBCs- will recheck in AM  4/6- Hb up to 8.1- con't regimen-   4/10: Hgb 8.0  4/13-  Hb 6.8- give 1 unit pRBCs- and recheck in AM- check hemoccult and monitor plts- down to 109k  4/14- Will check daily for now- Hb up to 7.7  4/16- Hb 7.5- will monitor  4/17- Hb down to 7.1 and hemoccult is (+)- transfused 1 unit pRBCs today- IM called GI for [name] for possible GI bleed, esp in setting of DVTs/on IV Heparin.  4/19 received tranfusion, hgb up to 8.6 today   -pt discussing EGD/colonoscopy with GI. Pt hesitant to pursue colonoscopy. 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 Patient did have assistance at home. Has "seeing eye dog" and husband  77. Constipation. Continue Senokot. MiraLAX as needed. No nausea vomiting   4/13- d/c imodium and start Metamucil per pt request- is appropriate- will also check Hemoccult- since dropping Hb.,   4/14- LBM 2 days ago- nursing to work on.   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 Fall overnight with new confusion  4/6- getting head CT and monitor- strength unchanged- appears in pain and tremors slightly worse- no other changes  4/7- MRI of brain negative for anything acute- still confused per staff- will make baclofen prn; also could be from IV ABX per pharmacist- will monitor closely.   4/8- cognition is better today- MRI (-)  17. L heel wound, unstageable.   4/9- will consult wound care and get healing shoe to take weight off heel and wound care suggestions.  18. Pruritis: Added hydroxyzine 67m TID prn 19. Thrombocytopenia  4/13- down to 109 from 180- will recheck in AM- Needs Xarelto, so if decreases more, will call IM about plan.   4/14- as above.   4/15- Plts up to 127k- will monitor- on Heparin  gtt  4/19- plts 124k    LOS: 18 days A FACE TO FACE EVALUATION WAS PERFORMED  ZMeredith Staggers4/19/2021, 11:10 AM

## 2020-02-16 NOTE — Progress Notes (Signed)
PROGRESS NOTE  [name] [name] WIO:[id number] DOB: 1956/04/24   PCP: Ann Held, DO  DOA: 01/29/2020 LOS: 68  Brief Narrative / Interim history: 64 year old female with history of PTSD, HTN, fibromyalgia, DM-2, cirrhosis, CKD-3, recent hospitalization for L1 osteomyelitis/epidural abscess with Pseudomonas discharged to CIR with a plan to complete IV cefepime through 5/7.  Hospitalist service consulted on 4/14 for AKI, thrombocytopenia, acute urinary retention and anemia.  AKI continued to get worse despite IV fluid and Foley catheter placement. Renal ultrasound without significant finding other than bladder distention.  She is not on any nephrotoxic agents and was on cefepime which could rarely cause AKI.  CT abdomen and pelvis without contrast with new mild T1 superior endplate compression fracture, mild lumbar paraspinal inflammation with no discrete postoperative hematoma and distended bladder but no acute finding.  MRI of thoracic and lumbar spine without acute finding though somewhat obscured by motion.  Nephrology consulted and initially started IV fluid.  Renal function continues to get worse.  Developed some respiratory distress with vascular congestion on CXR.  IV fluid discontinued.  Started on IV Lasix 80 mg twice daily.  Infectious disease consulted.  She was switched from cefepime to meropenem through 5/7. GI consulted for anemia with positive Hemoccult and recommended monitoring with intermittent transfusion as needed.   Subjective: Seen and examined earlier this morning.  No major events overnight of this morning.  Appears to be in good spirits this morning.  Working with physical therapy.  No complaints.  Objective: Vitals:   02/15/20 1513 02/15/20 2042 02/16/20 0344 02/16/20 0507  BP: 132/63 132/64 124/68   Pulse: 79 79 67   Resp: _0 Temp: 97.7 F (36.5 C) 98.3 F (36.8 C) (!) 97.5 F (36.4 C)   TempSrc: Oral Oral    SpO2: 95% 95% 97%   Weight:     98 kg    Intake/Output Summary (Last 24 hours) at 02/16/2020 1323 Last data filed at 02/16/2020 0721 Gross per 24 hour  Intake 520.65 ml  Output 3025 ml  Net -2504.35 ml   Filed Weights   02/12/20 1705 02/13/20 0520 02/16/20 0507  Weight: 81.7 kg 87.4 kg 98 kg    Examination:  GENERAL: No apparent distress.  Nontoxic. HEENT: MMM.  Vision and hearing grossly intact.  NECK: Supple.  No apparent JVD.  RESP: On room air.  No IWOB.  Fair aeration bilaterally. CVS:  RRR. Heart sounds normal.  ABD/GI/GU: Bowel sounds present. Soft. Non tender.  MSK/EXT:  Moves extremities. No apparent deformity. No edema.  SKIN: no apparent skin lesion or wound NEURO: Awake, alert and oriented appropriately.  No apparent focal neuro deficit. PSYCH: Calm. Normal affect.  Procedures:  None  Microbiology summarized: None  Assessment & Plan: AKI on CKD-3A with mild azotemia: History of renal transplant?  Baseline Cr 1.3-1.5>> 1.93>> 4.29> 4.94> 5.47.[name] Kitchen  BUN 47>> 59.  Unclear etiology but concern about possible interstitial nephritis from cefepime.  Also concern about obstructive etiology given acute urinary retention but no hydronephrosis on renal ultrasound. -Nephrology managing-on IV Lasix 80 mg twice daily.  3.8 L UOP/24 hrs.  Creatinine uptrending.  No uremia. -Cefepime discontinued.  Started on meropenem on 4/17 -Continue indwelling Foley catheter   Fluid overload: Likely due to renal failure and IV fluids.  Developed some respiratory distress and wheezing on 4/17.  BNP marginally elevated.  CXR with pulmonary congestion.  No history of CHF.  Echo in 2017 with EF of 55  to 60%.  Diuresed 3.8 L/ 24 hours.  However, renal function uptrending -IV Lasix as above per nephrology -Dialysis might be inevitable.  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  Patient underwent anterolateral corpectomy with  PSIF for osteomyelitis on 01/22/2020 by neurosurgery and discharged to CIR on 4/1 on cefepime 5/7.  CRP and ESR elevated.  Imaging including CT A/P, MRI T/L without contrast nonrevealing.  No leukocytosis or fever. -Now switched to meropenem in the setting of AKI -Pain management per primary-I agree with reducing opiate dose  -Continue PT/OT  Acute urinary retention: Could be due to medications with anticholinergic side effects such as opiates, diazepam, Atarax... but difficult situation due to her uncontrolled pain.  MRI of thoracic and lumbar spine without acute significant finding. -Continue indwelling Foley catheter -Started low-dose bethanechol.  Normocytic anemia: Baseline Hgb 11-13 before previous admission> 7.3 on discharge on 4/1> 6.7 on 4/5>1u> 8.1> 6.8 on 4/13>1u> 7.7> 8.1>> 7.1>1u>8.6.  Hemoccult positive.  Patient is on IV heparin for DVT.  Anemia panel and LDH within normal.  Very complex situation -Appreciate GI help-added PPI.  EGD? -Continue monitoring -IVC filter?  Thrombocytopenia: Dipped at 109 on 4/13>> 127> 121.  Due to liver cirrhosis?  Relatively stable. -Continue monitoring  Uncontrolled DM-2 with CKD-3A- most recent A1c 12.5%. Recent Labs    02/16/20 0650 02/16/20 0814 02/16/20 1134  GLUCAP 80 103* 145*  -Continue SSI-thin.  Discontinue Lantus due to hypoglycemia. -Continue statin.    Recent bilateral lower extremity DVT-diagnosed on 4/2.  She has been on Xarelto. 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  She is nonimmune to hepatitis B  Chronic left heel ulcer: No signs of infection -Appreciate wound care  OSA? -Would benefit from outpatient sleep study  Anxiety/depression/insomnia-could be playing a role into his uncontrolled pain -On Cymbalta, trazodone, Valium and Atarax.        DVT prophylaxis: On heparin drip for DVT Code Status: Full code Family Communication: None at  bedside.  Updated patient's husband at bedside on 4/16.  Final disposition: Per primary  Consultants:  TRH, nephrology, ID and GI    Sch Meds:  Scheduled Meds: . atorvastatin  10 mg Oral Daily  . bethanechol  10 mg Oral TID  . Chlorhexidine Gluconate Cloth  6 each Topical Daily  . dextrose      . feeding supplement (GLUCERNA SHAKE)  237 mL Oral TID BM  . furosemide  80 mg Intravenous BID  . insulin aspart  0-5 Units Subcutaneous QHS  . insulin aspart  0-9 Units Subcutaneous TID WC  . lidocaine  1 patch Transdermal Q24H  . multivitamin with minerals  1 tablet Oral Daily  . pantoprazole  40 mg Oral Q0600  . pregabalin  75 mg Oral QHS  . psyllium  1 packet Oral Daily  . senna  1 tablet Oral Daily  . sodium chloride flush  10-40 mL Intracatheter Q12H  . traZODone  50 mg Oral QHS   Continuous Infusions: . sodium chloride 250 mL (02/14/20 1829)  . heparin 1,200 Units/hr (02/16/20 0602)  . meropenem (MERREM) IV 1 g (02/16/20 0926)   PRN Meds:.sodium chloride, acetaminophen, diazepam, hydrOXYzine, ondansetron **OR** ondansetron (ZOFRAN) IV, oxyCODONE, polyethylene glycol, sodium chloride flush, sorbitol  Antimicrobials: Anti-infectives (From admission, onward)   Start     Dose/Rate Route Frequency Ordered Stop   02/14/20 1530  meropenem (  MERREM) 1 g in sodium chloride 0.9 % 100 mL IVPB     1 g 200 mL/hr over 30 Minutes Intravenous Every 12 hours 02/14/20 1228     02/13/20 0000  ceFEPime (MAXIPIME) IVPB  Status:  Discontinued     2 g Intravenous Every 24 hours 02/13/20 1438 02/14/20    02/12/20 0600  ceFEPIme (MAXIPIME) 2 g in sodium chloride 0.9 % 100 mL IVPB  Status:  Discontinued     2 g 200 mL/hr over 30 Minutes Intravenous Every 24 hours 02/11/20 1255 02/14/20 1127   02/10/20 0000  ceFEPime (MAXIPIME) IVPB  Status:  Discontinued     2 g Intravenous Every 12 hours 02/10/20 1450 02/13/20    01/29/20 2000  ceFEPIme (MAXIPIME) 2 g in sodium chloride 0.9 % 100 mL IVPB  Status:   Discontinued     2 g 200 mL/hr over 30 Minutes Intravenous Every 12 hours 01/29/20 1704 02/11/20 1255       I have personally reviewed the following labs and images: CBC: Recent Labs  Lab 02/12/20 0458 02/13/20 0417 02/14/20 0416 02/15/20 0353 02/16/20 0416  WBC 6.3 6.3 6.5 6.7 6.3  NEUTROABS 4.2 3.9 4.0 4.1 3.7  HGB 8.1* 7.5* 7.1* 8.5* 8.6*  HCT 27.1* 25.3* 24.5* 27.6* 27.9*  MCV 89.1 90.7 90.4 88.5 88.6  PLT 127* 121* 122* 121* 124*   BMP &GFR Recent Labs  Lab 02/12/20 0458 02/13/20 0417 02/14/20 0416 02/15/20 0353 02/16/20 0416  NA 137 136 133* 132* 134*  K 4.4 4.7 4.8 5.0 4.5  CL 107 107 105 105 103  CO2 22 21* 21* 20* 20*  GLUCOSE 109* 135* 115* 73 63*  BUN 37* 40* 47* 54* 59*  CREATININE 2.95* 3.36* 4.29* 4.94* 5.47*  CALCIUM 8.7* 8.7* 8.6* 8.6* 8.7*  MG  --   --   --  2.4  --    Estimated Creatinine Clearance (by C-G formula based on SCr of 5.47 mg/dL (H)) Female: 11.1 mL/min (A) Female: 13.6 mL/min (A) Liver & Pancreas: Recent Labs  Lab 02/12/20 0458  AST 17  ALT 12  ALKPHOS 101  BILITOT 0.6  PROT 6.6  ALBUMIN 1.8*   No results for input(s): LIPASE, AMYLASE in the last 168 hours. No results for input(s): AMMONIA in the last 168 hours. Diabetic: No results for input(s): HGBA1C in the last 72 hours. Recent Labs  Lab 02/16/20 0606 02/16/20 0622 02/16/20 0650 02/16/20 0814 02/16/20 1134  GLUCAP 65* 57* 80 103* 145*   Cardiac Enzymes: No results for input(s): CKTOTAL, CKMB, CKMBINDEX, TROPONINI in the last 168 hours. No results for input(s): PROBNP in the last 8760 hours. Coagulation Profile: No results for input(s): INR, PROTIME in the last 168 hours. Thyroid Function Tests: No results for input(s): TSH, T4TOTAL, FREET4, T3FREE, THYROIDAB in the last 72 hours. Lipid Profile: No results for input(s): CHOL, HDL, LDLCALC, TRIG, CHOLHDL, LDLDIRECT in the last 72 hours. Anemia Panel: No results for input(s): VITAMINB12, FOLATE, FERRITIN,  TIBC, IRON, RETICCTPCT in the last 72 hours. Urine analysis:    Component Value Date/Time   COLORURINE YELLOW 02/13/2020 1630   APPEARANCEUR HAZY (A) 02/13/2020 1630   LABSPEC 1.008 02/13/2020 1630   PHURINE 5.0 02/13/2020 1630   GLUCOSEU NEGATIVE 02/13/2020 1630   GLUCOSEU 500 (A) 12/03/2019 1309   HGBUR MODERATE (A) 02/13/2020 1630   HGBUR moderate 08/05/2010 1048   BILIRUBINUR NEGATIVE 02/13/2020 1630   BILIRUBINUR neg 11/15/2017 [place] 02/13/2020 1630  PROTEINUR 30 (A) 02/13/2020 1630   UROBILINOGEN >=8.0 (A) 12/03/2019 1309   NITRITE NEGATIVE 02/13/2020 1630   LEUKOCYTESUR SMALL (A) 02/13/2020 1630   Sepsis Labs: Invalid input(s): PROCALCITONIN, [place]  Microbiology: No results found for this or any previous visit (from the past 240 hour(s)).  Radiology Studies: No results found.    Trayden Brandy T. [place]  If 7PM-7AM, please contact night-coverage [email or web address] Password [email or web address] 02/16/2020, 1:23 PM

## 2020-02-16 NOTE — Progress Notes (Signed)
Hypoglycemic Event  CBG: 57  Treatment: 1 tube glucose gel  Symptoms: None  Follow-up CBG: Time:650 CBG Result:80  Possible Reasons for Event: med. regimen  Comments/MD notified:Will notify md    [name] [name], [employer], [employer]

## 2020-02-17 ENCOUNTER — Inpatient Hospital Stay (HOSPITAL_COMMUNITY): Payer: PRIVATE HEALTH INSURANCE

## 2020-02-17 ENCOUNTER — Inpatient Hospital Stay (HOSPITAL_COMMUNITY): Payer: PRIVATE HEALTH INSURANCE | Admitting: Occupational Therapy

## 2020-02-17 DIAGNOSIS — M4625 Osteomyelitis of vertebra, thoracolumbar region: Secondary | ICD-10-CM | POA: Diagnosis not present

## 2020-02-17 LAB — CBC WITH DIFFERENTIAL/PLATELET
Abs Immature Granulocytes: 0.18 10*3/uL — ABNORMAL HIGH (ref 0.00–0.07)
Basophils Absolute: 0 10*3/uL (ref 0.0–0.1)
Basophils Relative: 1 %
Eosinophils Absolute: 0.4 10*3/uL (ref 0.0–0.5)
Eosinophils Relative: 6 %
HCT: 26.6 % — ABNORMAL LOW (ref 36.0–46.0)
Hemoglobin: 8.1 g/dL — ABNORMAL LOW (ref 12.0–15.0)
Immature Granulocytes: 3 %
Lymphocytes Relative: 20 %
Lymphs Abs: 1.2 10*3/uL (ref 0.7–4.0)
MCH: 27.2 pg (ref 26.0–34.0)
MCHC: 30.5 g/dL (ref 30.0–36.0)
MCV: 89.3 fL (ref 80.0–100.0)
Monocytes Absolute: 0.9 10*3/uL (ref 0.1–1.0)
Monocytes Relative: 14 %
Neutro Abs: 3.6 10*3/uL (ref 1.7–7.7)
Neutrophils Relative %: 56 %
Platelets: 123 10*3/uL — ABNORMAL LOW (ref 150–400)
RBC: 2.98 MIL/uL — ABNORMAL LOW (ref 3.87–5.11)
RDW: 16.9 % — ABNORMAL HIGH (ref 11.5–15.5)
WBC: 6.2 10*3/uL (ref 4.0–10.5)
nRBC: 0 % (ref 0.0–0.2)

## 2020-02-17 LAB — GLUCOSE, CAPILLARY
Glucose-Capillary: 71 mg/dL (ref 70–99)
Glucose-Capillary: 60 mg/dL — ABNORMAL LOW (ref 70–99)
Glucose-Capillary: 75 mg/dL (ref 70–99)
Glucose-Capillary: 99 mg/dL (ref 70–99)
Glucose-Capillary: 143 mg/dL — ABNORMAL HIGH (ref 70–99)
Glucose-Capillary: 148 mg/dL — ABNORMAL HIGH (ref 70–99)
Glucose-Capillary: 154 mg/dL — ABNORMAL HIGH (ref 70–99)

## 2020-02-17 LAB — BASIC METABOLIC PANEL
Anion gap: 11 (ref 5–15)
BUN: 64 mg/dL — ABNORMAL HIGH (ref 8–23)
CO2: 20 mmol/L — ABNORMAL LOW (ref 22–32)
Calcium: 8.3 mg/dL — ABNORMAL LOW (ref 8.9–10.3)
Chloride: 104 mmol/L (ref 98–111)
Creatinine, Ser: 5.95 mg/dL — ABNORMAL HIGH (ref 0.44–1.00)
GFR calc Af Amer: 8 mL/min — ABNORMAL LOW (ref >60)
GFR calc non Af Amer: 7 mL/min — ABNORMAL LOW (ref >60)
Glucose, Bld: 81 mg/dL (ref 70–99)
Potassium: 5 mmol/L (ref 3.5–5.1)
Sodium: 135 mmol/L (ref 135–145)

## 2020-02-17 LAB — HEPARIN LEVEL (UNFRACTIONATED): Heparin Unfractionated: 0.36 IU/mL (ref 0.30–0.70)

## 2020-02-17 MED ORDER — GLUCOSE 40 % PO GEL
ORAL | Status: AC
  Administered 2020-02-17: 37.5 g
  Filled 2020-02-17: qty 1

## 2020-02-17 NOTE — Progress Notes (Signed)
Occupational Therapy Session Note  Patient Details  Name: Geniyah Eischeid MRN: [id number] Date of Birth: [birth year]  Today's Date: 02/17/2020 OT Individual Time: 1003-1100 OT Individual Time Calculation (min): 57 min    Short Term Goals: Week 3:   STG = LTGs due to remaining ELOS  Skilled Therapeutic Interventions/Progress Updates:    Treatment session with focus on sit > stand, stand pivot transfers, and activity tolerance.  Pt received upright in w/c agreeable to therapy session.  Pt reports fatigue but feeling better than earlier AM session.  Pt demonstrating frequent "nodding off" episodes with closing eyes and not finishing what she was talking about.  She would quickly re-arouse but would not complete initial thought.  Engaged in short distance ambulation with transfers, pt demonstrating increased knee "jerking" and "shakiness" with increased instability requiring up to Max assist due to instability and jerkiness in knees with mobility with RW.  Pt reports lightheadedness, notified RN who assessed vitals and blood sugar with all WNL.  Pt continued to "nod off", therefore returned to w/c Mod assist stand pivot with RW.  Engaged in therapeutic activity with BUE strengthening for endurance and sustained task.  Pt again noddinf off and would start over when counting repetitions.  Pt remained upright in w/c with seat belt alarm on and all needs in reach.  Notified RN of current status, and therapy concerns.    Therapy Documentation Precautions:  Precautions Precautions: Back, Fall, Other (comment)(has had issues w/orthostatic hypotension) Precaution Comments: Pt instructed in back precautions, and to self-monitor for activity tolerance, Required Braces or Orthoses: Spinal Brace Spinal Brace: Thoracolumbosacral orthotic, Applied in supine position Restrictions Weight Bearing Restrictions: No General:   Vital Signs: Therapy Vitals Temp: 98.1 F (36.7 C) Pulse Rate: 79 Resp: 18 BP:  131/69 Patient Position (if appropriate): Sitting Oxygen Therapy SpO2: 97 % O2 Device: Room Air Pain:  Pt with no c/o pain   Therapy/Group: Individual Therapy  Simonne Come 02/17/2020, 3:11 PM

## 2020-02-17 NOTE — Progress Notes (Signed)
Pt refused cpap

## 2020-02-17 NOTE — Patient Care Conference (Signed)
Inpatient RehabilitationTeam Conference and Plan of Care Update Date: 02/17/2020   Time: 11:40 AM   Patient Name: [name] [name]      Medical Record Number: [id number]  Date of Birth: 1956/06/22 Sex: Adult         Room/Bed: 4W17C/4W17C-01 Payor Info: Payor: [employer] / Plan: [employer]IC [employer]ERCIAL / Product Type: *No Product type* /    Admit Date/Time:  01/29/2020  4:50 PM  Primary Diagnosis:  Osteomyelitis of vertebra of thoracolumbar region [facility])  Patient Active Problem List   Diagnosis Date Noted  . Spinal stenosis of lumbar region 01/22/2020  . Osteomyelitis of vertebra of thoracolumbar region ([place]) 01/21/2020  . Paraspinal mass 01/20/2020  . Back pain 11/17/2019  . Compression fracture of L1 lumbar vertebra ([place]) 11/17/2019  . Fracture of multiple ribs 11/17/2019  . Intractable nausea and vomiting 11/16/2019  . AKI (acute kidney injury) ([place]) 11/15/2019  . Nausea & vomiting 11/15/2019  . Pressure injury of skin 04/17/2019  . Septic arthritis of elbow, right ([place]) 04/17/2019  . 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 Sleep apnea 11/16/2017  . Diabetic foot infection ([place]) 04/06/2017  . Type 2 diabetes mellitus with hyperglycemia, with long-term current use of insulin ([place]) 04/06/2017  . Ulcer of left heel, limited to breakdown of skin ([place]) 02/01/2017  . Neuropathy 05/05/2015  . Diabetic peripheral neuropathy ([place]) 01/12/2015  . CKD (chronic kidney disease), stage III ([place]) 05/27/2014  . History of MI (myocardial infarction) 10/06/2013  . Nausea and vomiting 10/06/2013  . Obesity (BMI 30-39.9) 09/20/2013  . 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 Normocytic anemia 02/03/2013  . CAD (coronary artery disease) 01/11/2012  . Hepatic cirrhosis ([place]) 07/06/2010  . 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  . Hyperlipidemia 06/03/2008  . Essential hypertension 12/23/2006    Expected Discharge Date: Expected Discharge Date: 02/18/20  Team Members  Present: Physician leading conference: Dr. Courtney Heys Care Coodinator Present: Karene Fry, RN, MSN;Auria Barbra Sarks, [place] Nurse Present: Dwaine Gale, RN PT Present: Apolinar Junes, PT OT Present: Elisabeth Most, OT SLP Present: Weston Anna, SLP PPS Coordinator present : Ileana Ladd, Burna Mortimer, SLP     Current Status/Progress Goal Weekly Team Focus  Bowel/Bladder   continent of B/B  remain continent of B/B  toilet prn   Swallow/Nutrition/ Hydration             ADL's   UB bathing/dressing minA, LB bathing/dressing min A, footwear max A, TLSO dependent, CGA SPT  CS  adl training, transfer training, balance, endurance, family education   Mobility   Min-mod A bed mobility, CGA-min A transfers with and without AD, gait 25 ft with RW  Min A bed mobility, CGA transfers and gait 50 ft using LRAD, min A 1+1 steps  Functional mobility, d/c planning, balance, LE and UE NMR, gait and stair training, patient/caregiver education   Communication             Safety/Cognition/ Behavioral Observations            Pain   c/o back pain 7/10  pain 3/10  assess qshift prn medicate as directed   Skin   honeycomb dressing to back intact  skin remain intact free of infection       Rehab Goals Patient on target to meet rehab goals: Yes *See Care Plan and progress notes for long and short-term goals.     Barriers to Discharge  Current Status/Progress Possible Resolutions Date Resolved   Nursing  PT  Home environment access/layout;Lack of/limited family support;Decreased caregiver support;Medical stability  Patient with change in medical status last week with decline in function, 2 single steps to enter home, husband works during the day and the patient may need to be alone for a short time during the day  Delayed d/c due to medical status and functional decline           OT                  SLP                SW Decreased caregiver support;Lack of/limited  family support              Discharge Planning/Teaching Needs:  D/c to home with support from husband (intermittent since he works); pt has paid caregiver 9am-12pm; husband comes home for lunch 12pm-1pm; friend who was checking on pt in the afternoon now has family obligation and unable to assist. Pt intends to increase pt sitter hours and adjust work schedule.  Family education as recommended by therapy.   Team Discussion: MD creatinine 5.95, BS 60 this am, will do EGD tomorrow, needs to go to acute, renal following.  RN BS 99 after eating, falls asleep, jerky movements, foley in.  OT tot LB B/D, transfers min Am mobility declining.  PT min/mod transfers Sunday, min/CGA transfers yesterday, amb 25' min A yesterday.   Revisions to Treatment Plan: N/A     Medical Summary Current Status: BG 99 at 11am- glucose and breakfast; more awake later; fluctuates/falls asleep; jerking movement; foley some output; not partipiciatory Weekly Focus/Goal: total assist LB (very decreased); transfers min assist- almost fell later at10am; little more awake; can't hold fork!  Barriers to Discharge: Behavior;Medical stability;Weight;Weight bearing restrictions;IV antibiotics  Barriers to Discharge Comments: acutely ill Possible Resolutions to Barriers: poor endurance; buckling; yesterday better- worse today   Continued Need for Acute Rehabilitation Level of Care: The patient requires daily medical management by a physician with specialized training in physical medicine and rehabilitation for the following reasons: Direction of a multidisciplinary physical rehabilitation program to maximize functional independence : Yes Medical management of patient stability for increased activity during participation in an intensive rehabilitation regime.: Yes Analysis of laboratory values and/or radiology reports with any subsequent need for medication adjustment and/or medical intervention. : Yes   I attest that I was  present, lead the team conference, and concur with the assessment and plan of the team.   Retta Diones 02/18/2020, 9:38 AM   Team conference was held via web/ teleconference due to [place] - 19

## 2020-02-17 NOTE — Progress Notes (Signed)
Chaplain responded to page for [name] [name] who is fearful of colonoscopy procedure tomorrow because her friend died of the mixture she had to take prior to her colonoscopy when they were 64 years old.  Chaplain offered ministry of presence and clarification around what else [name] [name] be afraid of.  A couple different issues surfaced which we talked about.  Provider entered and attempted to assuage [name] [name]'s fears by assuring her she is in no danger at this time. Provided prayer asking Jesus to remove all fear and hold her tightly in his arms until this procedure is over and to please guide the hands and minds of the medical team who care for her.  De Burrs Chaplain Resident

## 2020-02-17 NOTE — Consult Note (Addendum)
Daily Rounding Note  02/17/2020, 11:30 AM  LOS: 19 days   SUBJECTIVE:   Chief complaint: Anemia, FOBT +    No complaints.  stilll not able to commit to having colonoscopy but ok and consenting to EGD  OBJECTIVE:         Vital signs in last 24 hours:    Temp:  [97.8 F (36.6 C)-98.2 F (36.8 C)] 98.2 F (36.8 C) (04/20 0500) Pulse Rate:  [75-86] 86 (04/20 1035) Resp:  [17-18] 17 (04/20 0500) BP: (114-134)/(57-96) 131/60 (04/20 1035) SpO2:  [91 %-100 %] 97 % (04/20 1035) Weight:  [93.1 kg] 93.1 kg (04/20 0500) Last BM Date: 02/16/20 Filed Weights   02/13/20 0520 02/16/20 0507 02/17/20 0500  Weight: 87.4 kg 98 kg 93.1 kg   General: pale, chronically ill looking,   Heart: RRR Chest: clear  Abdomen: NT, active BS, ND  Extremities: mild LE edema Neuro/Psych:  Oriented x 3.   Intake/Output from previous day: 04/19 0701 - 04/20 0700 In: 710 [P.O.:700; I.V.:10] Out: 2250 [Urine:2250]  Intake/Output this shift: No intake/output data recorded.  Lab Results: Recent Labs    02/15/20 0353 02/16/20 0416 02/17/20 0500  WBC 6.7 6.3 6.2  HGB 8.5* 8.6* 8.1*  HCT 27.6* 27.9* 26.6*  PLT 121* 124* 123*   BMET Recent Labs    02/15/20 0353 02/16/20 0416 02/17/20 0500  NA 132* 134* 135  K 5.0 4.5 5.0  CL 105 103 104  CO2 20* 20* 20*  GLUCOSE 73 63* 81  BUN 54* 59* 64*  CREATININE 4.94* 5.47* 5.95*  CALCIUM 8.6* 8.7* 8.3*   LFT No results for input(s): PROT, ALBUMIN, AST, ALT, ALKPHOS, BILITOT, BILIDIR, IBILI in the last 72 hours. PT/INR No results for input(s): LABPROT, INR in the last 72 hours. Hepatitis Panel No results for input(s): HEPBSAG, HCVAB, HEPAIGM, HEPBIGM in the last 72 hours.  Studies/Results: No results found.  ASSESMENT:   *    Chronic anemia, FOBT positive.  Progressive decline Hgb fro ~ 10 - 11 >> 6.7.  Transfused PRBC x 3 thus far.  Iron and sats ok.  Low TIBC.  Ferritin 568.  B12,  Folate ok.   No previous GI procedures per epic.  Saw Dr Henrene Pastor in 2015 but did not follow through w planned colon/egd.   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  On hold, Heparin gtt in place.    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  No GI doctor per review of epic.  INR 1.2 in 01/22/20.  Hepatitis serologies negative.   CTAP with cirrhosis, mild splenomegaly.  *   Thrombocytopenia.    *     AKI.  *     Discitis T12, L2. Growing pseudomonas fron spine.  01/22/2020 spinal surgery., limited mobility.  Chronic MSSA septic arthritis/osteomyelitis of elbow.  *   Fibromyalgia, PTSD, legally blind, IDDM At baseline requires assist for ADLs.      PLAN   *   EGD tmrw at 1415.   Pt declined colonoscopy. Heparin off at 0900 tmrw, resume after procedure.   Clears in AM tmrw, NPO after 10 AM .        Azucena Freed  02/17/2020, 11:30 AM Phone [phone]    Attending physician's note   I have taken an interval history, reviewed the chart and examined the patient. I agree with the Advanced Practitioner's  note, impression and recommendations.   Anemia d/t blood loss.  Acute on chronic anemia.  Refuses colonoscopy (understands risks of missing colorectal neoplasms) Bilateral DVTs-Xarelto on hold.  Currently on heparin. NASH Cirrhosis with mild splenomegaly. CKD  Plan: -Agreeable to proceed with EGD.  Understands risks and benefits. -Hold heparin 6 hours prior. -Trend CBC.  Carmell Austria, MD Velora Heckler Fabienne Bruns [phone].

## 2020-02-17 NOTE — Progress Notes (Signed)
[facility] Progress Note   Subjective:    Patient evaluated with OT and nursing in the room. More somnolent this morning though oriented to person, place, and month. Slower to respond, but answered all commands. She endorses poor appetite and has yet to eat her breakfast. She denies any nausea or vomiting. Per chart, BG was 60 at 0822. Staff struggled to get patient to take her glucose. BG up to 75 within an hour and 143 most recently.   Objective Vitals:   02/16/20 1434 02/16/20 1944 02/17/20 0500 02/17/20 1035  BP: (!) 123/59 (!) 134/96 (!) 114/57 131/60  Pulse: 75 76 80 86  Resp: 18 18 17    Temp: 97.8 F (36.6 C) 98.1 F (36.7 C) 98.2 F (36.8 C)   TempSrc:   Oral   SpO2: 100% 91% 95% 97%  Weight:   93.1 kg    Physical Exam General: Ill-appearing though pleasant female lying in hospital bed with eyes closed. Somnolent. Foley catheter in place. NAD. A&Ox3. Heart: RRR, S1/S2 distinct. No rubs.  Lungs: Soft crackle in BL LL posteriorly while lying down. CTA BL anteriorly. Slight increase in work of breathing while speaking.  Abdomen: Soft, distended but nontender.  Extremities: 1+ edema of BL LE. Slight asterixis of the UE.    Filed Weights   02/13/20 0520 02/16/20 0507 02/17/20 0500  Weight: 87.4 kg 98 kg 93.1 kg    Intake/Output Summary (Last 24 hours) at 02/17/2020 1144 Last data filed at 02/17/2020 0145 Gross per 24 hour  Intake 590 ml  Output 2250 ml  Net -1660 ml    Additional Objective Labs: Basic Metabolic Panel: Recent Labs  Lab 02/15/20 0353 02/16/20 0416 02/17/20 0500  NA 132* 134* 135  K 5.0 4.5 5.0  CL 105 103 104  CO2 20* 20* 20*  GLUCOSE 73 63* 81  BUN 54* 59* 64*  CREATININE 4.94* 5.47* 5.95*  CALCIUM 8.6* 8.7* 8.3*   Liver Function Tests: Recent Labs  Lab 02/12/20 0458  AST 17  ALT 12  ALKPHOS 101  BILITOT 0.6  PROT 6.6  ALBUMIN 1.8*   No results for input(s): LIPASE, AMYLASE in the last 168 hours. CBC: Recent Labs   Lab 02/13/20 0417 02/13/20 0417 02/14/20 0416 02/14/20 0416 02/15/20 0353 02/16/20 0416 02/17/20 0500  WBC 6.3   < > 6.5   < > 6.7 6.3 6.2  NEUTROABS 3.9   < > 4.0   < > 4.1 3.7 3.6  HGB 7.5*   < > 7.1*   < > 8.5* 8.6* 8.1*  HCT 25.3*   < > 24.5*   < > 27.6* 27.9* 26.6*  MCV 90.7  --  90.4  --  88.5 88.6 89.3  PLT 121*   < > 122*   < > 121* 124* 123*   < > = values in this interval not displayed.   Blood Culture    Component Value Date/Time   SDES TISSUE 01/22/2020 1706   SPECREQUEST 2 LUMBAR 1 01/22/2020 1706   CULT  01/22/2020 1706    RARE PSEUDOMONAS AERUGINOSA CRITICAL RESULT CALLED TO, READ BACK BY AND VERIFIED WITH: RN [id number] FCP NO ANAEROBES ISOLATED Performed at [facility], [place] [address] [place], [place] [address]    REPTSTATUS 01/27/2020 FINAL 01/22/2020 1706    Cardiac Enzymes: No results for input(s): CKTOTAL, CKMB, CKMBINDEX, TROPONINI in the last 168 hours. CBG: Recent Labs  Lab 02/17/20 0610 02/17/20 3016 02/17/20 0918 02/17/20 1034  02/17/20 1130  GLUCAP 71 60* 75 99 143*   Iron Studies: No results for input(s): IRON, TIBC, TRANSFERRIN, FERRITIN in the last 72 hours. Lab Results  Component Value Date   INR 1.2 01/22/2020   INR 1.1 04/19/2019   INR 1.16 04/09/2017   Studies/Results: No results found.  Medications: . sodium chloride 250 mL (02/14/20 1829)  . heparin 1,200 Units/hr (02/17/20 0733)  . meropenem (MERREM) IV 1 g (02/17/20 0743)   . atorvastatin  10 mg Oral Daily  . bethanechol  10 mg Oral TID  . Chlorhexidine Gluconate Cloth  6 each Topical Daily  . feeding supplement (GLUCERNA SHAKE)  237 mL Oral TID BM  . furosemide  80 mg Intravenous BID  . insulin aspart  0-5 Units Subcutaneous QHS  . insulin aspart  0-9 Units Subcutaneous TID WC  . lidocaine  1 patch Transdermal Q24H  . multivitamin with minerals  1 tablet Oral Daily  . pantoprazole  40 mg Oral Q0600  . pregabalin  75 mg Oral QHS  . psyllium  1  packet Oral Daily  . senna  1 tablet Oral Daily  . sodium chloride flush  10-40 mL Intracatheter Q12H  . traZODone  50 mg Oral QHS    Assessment/Plan: [name] [name] is a 64 y.o female and patient of [place] with CKD stage III due to uncontrolled DM with proteinuria with an extensive PMH including HTN and septic arthritis of the R elbow. 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 Discharged to rehab here 4/1. Noted steady rise of Cr over the past week from 1.6 to 2.95 along with urinary retention. CT A/P as well as renal US ruled out obstruction 02/11/20. Nephrology consulted. Baseline SCr noted at 1.35-1.99. Required Foley placement 02/11/20. Cefepime DC'd, added meropenum 4/18. UOP improving. Monitoring persistently elevated SCr. Supportive care.   1. Progressive AKI on CKD stage III  - Nonoliguric and subnephrotic (UA 4/16 with 30 protein). FeNa 2.2. C3/C4 normal. - Cr worsened from 5.47 to 5.95 today but good UOP (2.25 L). K and HCO3 stable. - No apparent reversible issues. Suspect AIN due to cefepime but no improvement with switch to meropenum 4/18.  - No signs of overt uremia. Dialysis is not indicated at this time.  PLAN: - Continue supportive care, furosemide 80 mg IV BID. We will follow closely.  - Maintain foley until Cr plateaus. Attempt voiding trial with q6 hour bladder scans with I&O cath if >500 mL. - Obtain daily weights, daily BMP to trend SCr and electrolytes, and strict I/Os. - Avoid nephrotoxic agents including NSAIDs and IV contrast.   2. 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 Transitioned to cefepime 3/27-4/16. Recommended d/c considering potential acute interstitial nephritis as cause of kidney failure.  - Meropenum 1 g IV initiated 4/18. Planning to continue through 03/05/20.   4.Anemiaof CKD - Baseline Hgb of 10.  Stable at 8.1 today. 02/12/20: Iron 35, TIBC 216, Ferritin 568. - Required pRBC x 3 this admission. GI following. Planning for EGD tomorrow afternoon. Will hold heparin in the AM for procedure.   5. 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 HIV Ab and Hep panel non-reactive. - Per primary team.   6. Longstanding DMT2 with microvascular disease - Most recent HbA1c 12.5%.  - With poor kidney function, patient requiring less insulin. BG dropped to 60 this morning and patient confused. -  Primary team managing. Currently holding lantus. Novolog sliding scale. Monitoring BG QID.  7. Recent lower extremity DVT - On heparin. Continue per primary team.     [name] Sprinkles, PA-S2 [facility]

## 2020-02-17 NOTE — Plan of Care (Signed)
  Problem: Consults Goal: RH SPINAL CORD INJURY PATIENT EDUCATION Description:  See Patient Education module for education specifics.  Outcome: Progressing Goal: Skin Care Protocol Initiated - if Braden Score 18 or less Description: If consults are not indicated, leave blank or document N/A Outcome: Progressing Goal: Nutrition Consult-if indicated Outcome: Progressing Goal: Diabetes Guidelines if Diabetic/Glucose > 140 Description: If diabetic or lab glucose is > 140 mg/dl - Initiate Diabetes/Hyperglycemia Guidelines & Document Interventions  Outcome: Progressing   Problem: RH SKIN INTEGRITY Goal: RH STG SKIN FREE OF INFECTION/BREAKDOWN Description: Patients skin will remain free from further breakdown or infection with mod I assist. Outcome: Progressing Goal: RH STG MAINTAIN SKIN INTEGRITY WITH ASSISTANCE Description: STG Maintain Skin Integrity With mod Assistance. Outcome: Progressing Goal: RH STG ABLE TO PERFORM INCISION/WOUND CARE W/ASSISTANCE Description: STG Able To Perform Incision/Wound Care With min Assistance. Outcome: Progressing   Problem: RH SAFETY Goal: RH STG ADHERE TO SAFETY PRECAUTIONS W/ASSISTANCE/DEVICE Description: STG Adhere to Safety Precautions With min Assistance/Device. Outcome: Progressing Goal: RH STG DECREASED RISK OF FALL WITH ASSISTANCE Description: STG Decreased Risk of Fall With min Assistance. Outcome: Progressing   Problem: RH PAIN MANAGEMENT Goal: RH STG PAIN MANAGED AT OR BELOW PT'S PAIN GOAL Description: Pain < 5 Outcome: Progressing   Problem: RH KNOWLEDGE DEFICIT SCI Goal: RH STG INCREASE KNOWLEDGE OF SELF CARE AFTER SCI Outcome: Progressing

## 2020-02-17 NOTE — Progress Notes (Signed)
Physical Therapy Session Note  Patient Details  Name: [name] [name] MRN: [id number] Date of Birth: [birth year]  Today's Date: 02/17/2020 PT Individual Time: 1451-1545 PT Individual Time Calculation (min): 54 min   Short Term Goals: Week 3:  PT Short Term Goal 1 (Week 3): STG = LTG due to ELOS.  Skilled Therapeutic Interventions/Progress Updates:     Patient in w/c with TLSO brace donned upon PT arrival. Patient alert and eager to start PT session. Patient reported 6-7/10 back pain at incision sites during session, RN made aware. PT provided repositioning, rest breaks, and distraction as pain interventions throughout session.   Therapeutic Activity: Bathing/Dressing: Patient expressed frustration that she was wearing the same shirt as yesterday. RN came in to unhook IV. Doffed TLSO with total A with patient seated in w/c, per medical team orders. Patient doffed her shirt with min A for protecting IV lines. She performed UB bathing with a washcloth with set-up assist, then donned a new shirt with min A for IV line protection. Donned TLSO brace with max A, provided time for patient to assist with placement of brace, difficult due to visual deficits. RN hooked up IV line following.  Bed Mobility: Patient performed sit to supine with CGA-close supervision and rolling R/L with supervision with use of bed rails. Provided verbal cues for keeping hips and shoulders in line during log rolling and while bringing LE's up to the bed to maintain spinal precautions and manage pain. Transfers: Patient performed sit to/from stand x3 and remained standing with B UE support using a RW 20-45 sec each trial with CGA and min A x2 due to B knee buckling/spasticity and stand pivot x1 with CGA-min A. Provided verbal cues for hand placement with hand over hand for pushing up from arm rest to stand and forward weight shift. Patient was very tentative and anxious in standing due to "near fall" with therapy earlier today  due to B knee buckling.   Patient in bed at end of session with breaks locked, bed alarm set, and all needs within reach. Patient initially asking to stay seated in chair at end of session. Patient sitting since therapy this morning, educated on pressure relief for maintaining skin integrity and suggested that patient go back to bed and get up with nursing staff for dinner, patient in agreement.    Therapy Documentation Precautions:  Precautions Precautions: Back, Fall, Other (comment)(has had issues w/orthostatic hypotension) Precaution Comments: Pt instructed in back precautions, and to self-monitor for activity tolerance, Required Braces or Orthoses: Spinal Brace Spinal Brace: Thoracolumbosacral orthotic Restrictions Weight Bearing Restrictions: No    Therapy/Group: Individual Therapy  Johnnell Liou L Zoua Caporaso PT, DPT  02/17/2020, 6:51 PM

## 2020-02-17 NOTE — H&P (View-Only) (Signed)
Daily Rounding Note  02/17/2020, 11:30 AM  LOS: 19 days   SUBJECTIVE:   Chief complaint: Anemia, FOBT +    No complaints.  stilll not able to commit to having colonoscopy but ok and consenting to EGD  OBJECTIVE:         Vital signs in last 24 hours:    Temp:  [97.8 F (36.6 C)-98.2 F (36.8 C)] 98.2 F (36.8 C) (04/20 0500) Pulse Rate:  [75-86] 86 (04/20 1035) Resp:  [17-18] 17 (04/20 0500) BP: (114-134)/(57-96) 131/60 (04/20 1035) SpO2:  [91 %-100 %] 97 % (04/20 1035) Weight:  [93.1 kg] 93.1 kg (04/20 0500) Last BM Date: 02/16/20 Filed Weights   02/13/20 0520 02/16/20 0507 02/17/20 0500  Weight: 87.4 kg 98 kg 93.1 kg   General: pale, chronically ill looking,   Heart: RRR Chest: clear  Abdomen: NT, active BS, ND  Extremities: mild LE edema Neuro/Psych:  Oriented x 3.   Intake/Output from previous day: 04/19 0701 - 04/20 0700 In: 710 [P.O.:700; I.V.:10] Out: 2250 [Urine:2250]  Intake/Output this shift: No intake/output data recorded.  Lab Results: Recent Labs    02/15/20 0353 02/16/20 0416 02/17/20 0500  WBC 6.7 6.3 6.2  HGB 8.5* 8.6* 8.1*  HCT 27.6* 27.9* 26.6*  PLT 121* 124* 123*   BMET Recent Labs    02/15/20 0353 02/16/20 0416 02/17/20 0500  NA 132* 134* 135  K 5.0 4.5 5.0  CL 105 103 104  CO2 20* 20* 20*  GLUCOSE 73 63* 81  BUN 54* 59* 64*  CREATININE 4.94* 5.47* 5.95*  CALCIUM 8.6* 8.7* 8.3*   LFT No results for input(s): PROT, ALBUMIN, AST, ALT, ALKPHOS, BILITOT, BILIDIR, IBILI in the last 72 hours. PT/INR No results for input(s): LABPROT, INR in the last 72 hours. Hepatitis Panel No results for input(s): HEPBSAG, HCVAB, HEPAIGM, HEPBIGM in the last 72 hours.  Studies/Results: No results found.  ASSESMENT:   *    Chronic anemia, FOBT positive.  Progressive decline Hgb fro ~ 10 - 11 >> 6.7.  Transfused PRBC x 3 thus far.  Iron and sats ok.  Low TIBC.  Ferritin 568.  B12,  Folate ok.   No previous GI procedures per epic.  Saw Dr Henrene Pastor in 2015 but did not follow through w planned colon/egd.   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  On hold, Heparin gtt in place.    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  No GI doctor per review of epic.  INR 1.2 in 01/22/20.  Hepatitis serologies negative.   CTAP with cirrhosis, mild splenomegaly.  *   Thrombocytopenia.    *     AKI.  *     Discitis T12, L2. Growing pseudomonas fron spine.  01/22/2020 spinal surgery., limited mobility.  Chronic MSSA septic arthritis/osteomyelitis of elbow.  *   Fibromyalgia, PTSD, legally blind, IDDM At baseline requires assist for ADLs.      PLAN   *   EGD tmrw at 1415.   Pt declined colonoscopy. Heparin off at 0900 tmrw, resume after procedure.   Clears in AM tmrw, NPO after 10 AM .        Azucena Freed  02/17/2020, 11:30 AM Phone [phone]    Attending physician's note   I have taken an interval history, reviewed the chart and examined the patient. I agree with the Advanced Practitioner's  note, impression and recommendations.   Anemia d/t blood loss.  Acute on chronic anemia.  Refuses colonoscopy (understands risks of missing colorectal neoplasms) Bilateral DVTs-Xarelto on hold.  Currently on heparin. NASH Cirrhosis with mild splenomegaly. CKD  Plan: -Agreeable to proceed with EGD.  Understands risks and benefits. -Hold heparin 6 hours prior. -Trend CBC.  Carmell Austria, MD Velora Heckler Fabienne Bruns [phone].

## 2020-02-17 NOTE — Progress Notes (Signed)
PROGRESS NOTE  [name] [name] JME:[id number] DOB: 07-08-1956   PCP: Ann Held, DO  DOA: 01/29/2020 LOS: 24  Brief Narrative / Interim history: 64 year old female with history of PTSD, HTN, fibromyalgia, DM-2, cirrhosis, CKD-3, recent hospitalization for L1 osteomyelitis/epidural abscess with Pseudomonas discharged to CIR with a plan to complete IV cefepime through 5/7.  Hospitalist service consulted on 4/14 for AKI, thrombocytopenia, acute urinary retention and anemia.  AKI continued to get worse despite IV fluid and Foley catheter placement. Renal ultrasound without significant finding other than bladder distention.  She is not on any nephrotoxic agents and was on cefepime which could rarely cause AKI.  CT abdomen and pelvis without contrast with new mild T1 superior endplate compression fracture, mild lumbar paraspinal inflammation with no discrete postoperative hematoma and distended bladder but no acute finding.  MRI of thoracic and lumbar spine without acute finding though somewhat obscured by motion.  Nephrology consulted and initially started IV fluid.  Renal function continues to get worse.  Developed some respiratory distress with vascular congestion on CXR.  IV fluid discontinued.  Started on IV Lasix 80 mg twice daily.  Infectious disease consulted.  She was switched from cefepime to meropenem through 5/7. GI consulted for anemia with positive Hemoccult.  Plan for EGD on 4/21.  Patient declined colonoscopy.   Subjective: Seen and examined this afternoon.  Sitting on a wheelchair.  Appears to be in good spirit.  No major events overnight or this morning other than low blood glucose to 60 despite discontinuing her Lantus.  No complaints at this time.  Objective: Vitals:   02/16/20 1944 02/17/20 0500 02/17/20 1035 02/17/20 1421  BP: (!) 134/96 (!) 114/57 131/60 131/69  Pulse: 76 80 86 79  Resp: 18 17  18   Temp: 98.1 F (36.7 C) 98.2 F (36.8 C)  98.1 F (36.7 C)    TempSrc:  Oral    SpO2: 91% 95% 97% 97%  Weight:  93.1 kg      Intake/Output Summary (Last 24 hours) at 02/17/2020 1503 Last data filed at 02/17/2020 1306 Gross per 24 hour  Intake 830 ml  Output 1850 ml  Net -1020 ml   Filed Weights   02/13/20 0520 02/16/20 0507 02/17/20 0500  Weight: 87.4 kg 98 kg 93.1 kg    Examination:  GENERAL: No apparent distress.  Nontoxic.  Sitting on her wheelchair. HEENT: MMM.  Vision and hearing grossly intact.  NECK: Supple.  No apparent JVD.  RESP: On room air.  No IWOB.  Fair aeration bilaterally. CVS:  RRR. Heart sounds normal.  ABD/GI/GU: Bowel sounds present. Soft. Non tender.  MSK/EXT:  Moves extremities. No apparent deformity.  Trace edema. SKIN: no apparent skin lesion or wound NEURO: Awake, alert and oriented appropriately.  No apparent focal neuro deficit. PSYCH: Calm. Normal affect.   Procedures:  None  Microbiology summarized: None  Assessment & Plan: AKI on CKD-3A with mild azotemia: History of renal transplant?  Baseline Cr 1.3-1.5>> 1.93>> 4.29> 4.94> 5.47> 5.95.  BUN 47>> 59> 68.  Unclear etiology but concern about possible interstitial nephritis from cefepime.  Also concern about obstructive etiology given acute urinary retention but no hydronephrosis on renal ultrasound. -Nephrology managing-on IV Lasix 80 mg twice daily.  2.3 L UOP/24 hrs.  Creatinine uptrending.  No uremia. -Cefepime discontinued.  Started on meropenem on 4/17 -Continue indwelling Foley catheter   Fluid overload: Likely due to renal failure and IV fluids.  Developed some respiratory distress and wheezing on  4/17.  BNP marginally elevated.  CXR with pulmonary congestion.  No history of CHF.  Echo in 2017 with EF of 55 to 60%.  Respiratory symptoms resolved.  Diuresed 2.3 L/ 24 hours.  However, renal function uptrending -IV Lasix as above per nephrology  Acute on chronic back pain with left-sided radiculopathy in patient with history of L1  osteomyelitis/epidural abscess status post instrumentation.  Patient underwent anterolateral corpectomy with PSIF for osteomyelitis on 01/22/2020 by neurosurgery and discharged to CIR on 4/1 on cefepime 5/7.  CRP and ESR elevated.  Imaging including CT A/P, MRI T/L without contrast nonrevealing.  No leukocytosis or fever. -Now switched to meropenem in the setting of AKI.  Will continue antibiotic through 5/7. -Pain management per primary-I agree with reducing opiate dose  -Continue PT/OT  Acute urinary retention: Could be due to medications with anticholinergic side effects such as opiates, diazepam, Atarax... but difficult situation due to her uncontrolled pain.  MRI of thoracic and lumbar spine without acute significant finding. -Continue indwelling Foley catheter -Started low-dose bethanechol.  Normocytic anemia: Baseline Hgb 11-13 before previous admission> 7.3 on discharge on 4/1> 6.7 on 4/5>1u> 8.1> 6.8 on 4/13>1u> 7.7> 8.1>> 7.1>1u>8.6> 8.1.  Hemoccult positive.  Patient is on IV heparin for DVT.  Anemia panel and LDH within normal.  Very complex situation.  -Appreciate GI help-PPI, and EGD on 4/21.  Patient declined colonoscopy. -Continue monitoring -IVC filter?  Thrombocytopenia: Dipped at 109 on 4/13>> 127> 121.  Due to liver cirrhosis?  Relatively stable. -Continue monitoring  Uncontrolled DM-2 with CKD-3A- most recent A1c 12.5%. Recent Labs    02/17/20 0918 02/17/20 1034 02/17/20 1130  GLUCAP 75 99 143*  -Reduce SSI to renal.  -Continue statin.    Recent bilateral lower extremity DVT-diagnosed on 4/2.  She has been on Xarelto. 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  She is nonimmune to hepatitis B  Chronic left heel ulcer: No signs of infection -Appreciate wound care  OSA? -Would benefit from outpatient sleep study  Anxiety/depression/insomnia-could be playing a role into his uncontrolled pain -On  Cymbalta, trazodone, Valium and Atarax.        DVT prophylaxis: On heparin drip for DVT Code Status: Full code Family Communication: None at bedside.  Updated patient's husband at bedside on 4/16.  Final disposition: Per primary  Consultants:  TRH, nephrology, ID and GI    Sch Meds:  Scheduled Meds: . atorvastatin  10 mg Oral Daily  . bethanechol  10 mg Oral TID  . Chlorhexidine Gluconate Cloth  6 each Topical Daily  . feeding supplement (GLUCERNA SHAKE)  237 mL Oral TID BM  . furosemide  80 mg Intravenous BID  . insulin aspart  0-5 Units Subcutaneous QHS  . insulin aspart  0-9 Units Subcutaneous TID WC  . lidocaine  1 patch Transdermal Q24H  . multivitamin with minerals  1 tablet Oral Daily  . pantoprazole  40 mg Oral Q0600  . pregabalin  75 mg Oral QHS  . psyllium  1 packet Oral Daily  . senna  1 tablet Oral Daily  . sodium chloride flush  10-40 mL Intracatheter Q12H  . traZODone  50 mg Oral QHS   Continuous Infusions: . sodium chloride 250 mL (02/14/20 1829)  . heparin 1,200 Units/hr (02/17/20 0733)  . meropenem (MERREM) IV 1 g (02/17/20 0743)   PRN Meds:.sodium chloride, acetaminophen, diazepam, hydrOXYzine, ondansetron **OR** ondansetron (ZOFRAN) IV, oxyCODONE, polyethylene glycol, sodium chloride  flush, sorbitol  Antimicrobials: Anti-infectives (From admission, onward)   Start     Dose/Rate Route Frequency Ordered Stop   02/14/20 1530  meropenem (MERREM) 1 g in sodium chloride 0.9 % 100 mL IVPB     1 g 200 mL/hr over 30 Minutes Intravenous Every 12 hours 02/14/20 1228     02/13/20 0000  ceFEPime (MAXIPIME) IVPB  Status:  Discontinued     2 g Intravenous Every 24 hours 02/13/20 1438 02/14/20    02/12/20 0600  ceFEPIme (MAXIPIME) 2 g in sodium chloride 0.9 % 100 mL IVPB  Status:  Discontinued     2 g 200 mL/hr over 30 Minutes Intravenous Every 24 hours 02/11/20 1255 02/14/20 1127   02/10/20 0000  ceFEPime (MAXIPIME) IVPB  Status:  Discontinued     2 g  Intravenous Every 12 hours 02/10/20 1450 02/13/20    01/29/20 2000  ceFEPIme (MAXIPIME) 2 g in sodium chloride 0.9 % 100 mL IVPB  Status:  Discontinued     2 g 200 mL/hr over 30 Minutes Intravenous Every 12 hours 01/29/20 1704 02/11/20 1255       I have personally reviewed the following labs and images: CBC: Recent Labs  Lab 02/13/20 0417 02/14/20 0416 02/15/20 0353 02/16/20 0416 02/17/20 0500  WBC 6.3 6.5 6.7 6.3 6.2  NEUTROABS 3.9 4.0 4.1 3.7 3.6  HGB 7.5* 7.1* 8.5* 8.6* 8.1*  HCT 25.3* 24.5* 27.6* 27.9* 26.6*  MCV 90.7 90.4 88.5 88.6 89.3  PLT 121* 122* 121* 124* 123*   BMP &GFR Recent Labs  Lab 02/13/20 0417 02/14/20 0416 02/15/20 0353 02/16/20 0416 02/17/20 0500  NA 136 133* 132* 134* 135  K 4.7 4.8 5.0 4.5 5.0  CL 107 105 105 103 104  CO2 21* 21* 20* 20* 20*  GLUCOSE 135* 115* 73 63* 81  BUN 40* 47* 54* 59* 64*  CREATININE 3.36* 4.29* 4.94* 5.47* 5.95*  CALCIUM 8.7* 8.6* 8.6* 8.7* 8.3*  MG  --   --  2.4  --   --    Estimated Creatinine Clearance (by C-G formula based on SCr of 5.95 mg/dL (H)) Female: 9.9 mL/min (A) Female: 12.2 mL/min (A) Liver & Pancreas: Recent Labs  Lab 02/12/20 0458  AST 17  ALT 12  ALKPHOS 101  BILITOT 0.6  PROT 6.6  ALBUMIN 1.8*   No results for input(s): LIPASE, AMYLASE in the last 168 hours. No results for input(s): AMMONIA in the last 168 hours. Diabetic: No results for input(s): HGBA1C in the last 72 hours. Recent Labs  Lab 02/17/20 0610 02/17/20 0822 02/17/20 0918 02/17/20 1034 02/17/20 1130  GLUCAP 71 60* 75 99 143*   Cardiac Enzymes: No results for input(s): CKTOTAL, CKMB, CKMBINDEX, TROPONINI in the last 168 hours. No results for input(s): PROBNP in the last 8760 hours. Coagulation Profile: No results for input(s): INR, PROTIME in the last 168 hours. Thyroid Function Tests: No results for input(s): TSH, T4TOTAL, FREET4, T3FREE, THYROIDAB in the last 72 hours. Lipid Profile: No results for input(s): CHOL,  HDL, LDLCALC, TRIG, CHOLHDL, LDLDIRECT in the last 72 hours. Anemia Panel: No results for input(s): VITAMINB12, FOLATE, FERRITIN, TIBC, IRON, RETICCTPCT in the last 72 hours. Urine analysis:    Component Value Date/Time   COLORURINE YELLOW 02/13/2020 1630   APPEARANCEUR HAZY (A) 02/13/2020 1630   LABSPEC 1.008 02/13/2020 1630   PHURINE 5.0 02/13/2020 1630   GLUCOSEU NEGATIVE 02/13/2020 1630   GLUCOSEU 500 (A) 12/03/2019 1309   HGBUR MODERATE (A) 02/13/2020 1630  HGBUR moderate 08/05/2010 1048   BILIRUBINUR NEGATIVE 02/13/2020 1630   BILIRUBINUR neg 11/15/2017 1241   KETONESUR NEGATIVE 02/13/2020 1630   PROTEINUR 30 (A) 02/13/2020 1630   UROBILINOGEN >=8.0 (A) 12/03/2019 1309   NITRITE NEGATIVE 02/13/2020 1630   LEUKOCYTESUR SMALL (A) 02/13/2020 1630   Sepsis Labs: Invalid input(s): PROCALCITONIN, [place]  Microbiology: No results found for this or any previous visit (from the past 240 hour(s)).  Radiology Studies: No results found.    Annaya Bangert T. Cosmos  If 7PM-7AM, please contact night-coverage [email or web address] Password [facility] 02/17/2020, 3:03 PM

## 2020-02-17 NOTE — Progress Notes (Addendum)
ANTICOAGULATION CONSULT NOTE - Follow Up Consult  Pharmacy Consult for heparin Indication: DVT  Allergies  Allergen Reactions  . Bee Pollen Anaphylaxis  . Fish-Derived Products Hives, Shortness Of Breath, Swelling and Rash    Hives get in throat causing trouble breathing  . Mushroom Extract Complex Anaphylaxis  . Penicillins Anaphylaxis    **Tolerated cefepime March 2021 Did it involve swelling of the face/tongue/throat, SOB, or low BP? Yes Did it involve sudden or severe rash/hives, skin peeling, or any reaction on the inside of your mouth or nose? No Did you need to seek medical attention at a hospital or doctor's office? Yes When did it last happen?A few months ago If all above answers are "NO", may proceed with cephalosporin use.  [name] Kitchen Rosemary Oil Anaphylaxis  . Shellfish Allergy Hives, Shortness Of Breath, Swelling and Rash  . Tomato Hives and Shortness Of Breath    Hives in throat causes her trouble breathing  . Acetaminophen Other (See Comments)    GI upset  . Acyclovir And Related Other (See Comments)    Unknown rxn  . Aloe Vera Hives  . Broccoli [Brassica Oleracea] Hives  . Naproxen Other (See Comments)    Unknown rxn    Patient Measurements: Wt: 81.7 kg   Vital Signs: Temp: 98.2 F (36.8 C) (04/20 0500) Temp Source: Oral (04/20 0500) BP: 114/57 (04/20 0500) Pulse Rate: 80 (04/20 0500)  Labs: Recent Labs    02/15/20 0353 02/15/20 0353 02/16/20 0416 02/16/20 1229 02/17/20 0500  HGB 8.5*   < > 8.6*  --  8.1*  HCT 27.6*  --  27.9*  --  26.6*  PLT 121*  --  124*  --  123*  APTT 69*  --   --   --   --   HEPARINUNFRC 0.47   < > 0.29* 0.43 0.36  CREATININE 4.94*  --  5.47*  --  5.95*   < > = values in this interval not displayed.    Estimated Creatinine Clearance (by C-G formula based on SCr of 5.95 mg/dL (H)) Female: 9.9 mL/min (A) Female: 12.2 mL/min (A)  Assessment: 38 YOF with new bilateral DVTs. Pharmacy consulted for IV heparin; Xarelto  discontinued for worsening renal function. Of note, patient fell recently but CT head was negative.   Heparin level is therapeutic at 0.36 on 1200 units/hr. No bleeding noted, Hgb low stable at 8.1, platelets 123. No infusion issues or s/sx of bleeding.    Goal of Therapy:  Heparin level 0.3-0.7 units/ml Monitor platelets by anticoagulation protocol: Yes   Plan:  Continue heparin drip at 1200 units/hr  Daily heparin level and CBC Monitor for s/sx of bleeding  Thank you for involving pharmacy in this patient's care.  Antonietta Jewel, PharmD, [place] Clinical Pharmacist  Phone: [phone] 02/17/2020 7:41 AM  Please check AMION for all [place] phone numbers After 10:00 PM, call [place] [phone]

## 2020-02-17 NOTE — Progress Notes (Signed)
Social Work [name], adult   DOB: May 02, 1956, 64 y.o.   MRN: [id number]    SW met with pt in room to inform on pt d/c date pending due to medical reasons. Pt reports that her husband would like for a medical team member to provide an update. SW relayed pt concerns to medical team.  Loralee Pacas, MSW, [place] Office: [phone] Cell: [phone] Fax: [phone]

## 2020-02-17 NOTE — Progress Notes (Signed)
[facility] PHYSICAL MEDICINE & REHABILITATION PROGRESS NOTE   Subjective/Complaints:   Pt reports feels confused- not sure where she is- nursing reports pt's BG is 60- and have to get her BG up but not taking glucose like they're asking- OT says been in room 45 minutes, but still in bed.     ROS:  Pt denies SOB, abd pain, CP, N/V/C/D, and vision changes    Objective:   No results found. Recent Labs    02/16/20 0416 02/17/20 0500  WBC 6.3 6.2  HGB 8.6* 8.1*  HCT 27.9* 26.6*  PLT 124* 123*   Recent Labs    02/16/20 0416 02/17/20 0500  NA 134* 135  K 4.5 5.0  CL 103 104  CO2 20* 20*  GLUCOSE 63* 81  BUN 59* 64*  CREATININE 5.47* 5.95*  CALCIUM 8.7* 8.3*    Intake/Output Summary (Last 24 hours) at 02/17/2020 1041 Last data filed at 02/17/2020 0145 Gross per 24 hour  Intake 590 ml  Output 2250 ml  Net -1660 ml     Physical Exam: Vital Signs Blood pressure 131/60, pulse 86, temperature 98.2 F (36.8 C), temperature source Oral, resp. rate 17, weight 93.1 kg, SpO2 97 %. Constitutional: No distress . Vital signs reviewed. Laying in bed; recognized me, but didn't know initially who I was- vision? 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 CN exam intact.  Skin:  Back incision not visualized  as she was in brace L heel ulcer- bandaged C/D/I Healed scars on R elbow and R foot      Assessment/Plan: 1. Functional deficits secondary to osteomyelitis of vertebral of thoracolumbar region. Status post L1 corpectomy with pedicle screw fixation T11, 12, L2 and L3 01/22/2020 which require 3+ hours per day of interdisciplinary therapy in a comprehensive inpatient rehab setting.  Physiatrist is providing close team supervision and 24 hour management of active medical problems listed below.  Physiatrist and rehab team continue to assess barriers to discharge/monitor patient progress toward functional and medical goals  Care Tool:  Bathing    Body parts bathed by patient: Right arm, Left arm, Chest, Abdomen, Front perineal area, Right upper leg, Left upper leg, Face, Right lower leg, Left lower leg   Body parts bathed by helper: Front perineal area, Buttocks     Bathing assist Assist Level: Maximal Assistance - Patient 24 - 49%     Upper Body Dressing/Undressing Upper body dressing   What is the patient wearing?: Pull over shirt, Orthosis Orthosis activity level: Performed by helper  Upper body assist Assist Level: Minimal Assistance - Patient > 75%    Lower Body Dressing/Undressing Lower body dressing      What is the patient wearing?: Underwear/pull up, Pants     Lower body assist Assist for lower body dressing: Maximal Assistance - Patient 25 - 49%  Toileting Toileting    Toileting assist Assist for toileting: Moderate Assistance - Patient 50 - 74%     Transfers Chair/bed transfer  Transfers assist     Chair/bed transfer assist level: Minimal Assistance - Patient > 75% Chair/bed transfer assistive device: Programmer, multimedia   Ambulation assist      Assist level: Minimal Assistance - Patient > 75% Assistive device: Walker-rolling Max distance: 25 ft   Walk 10 feet activity   Assist     Assist level: Minimal Assistance - Patient  > 75% Assistive device: Walker-rolling   Walk 50 feet activity   Assist Walk 50 feet with 2 turns activity did not occur: Safety/medical concerns  Assist level: Contact Guard/Touching assist Assistive device: Walker-rolling    Walk 150 feet activity   Assist Walk 150 feet activity did not occur: Safety/medical concerns         Walk 10 feet on uneven surface  activity   Assist     Assist level: Moderate Assistance - Patient - 50 - 74% Assistive device: Aeronautical engineer Will patient use wheelchair at discharge?: No             Wheelchair 50 feet with 2 turns activity    Assist            Wheelchair 150 feet activity     Assist          Blood pressure 131/60, pulse 86, temperature 98.2 F (36.8 C), temperature source Oral, resp. rate 17, weight 93.1 kg, SpO2 97 %.  Medical Problem List and Plan:  1. Decreased functional mobility secondary to osteomyelitis of vertebral of thoracolumbar region. Status post L1 corpectomy with pedicle screw fixation T11, 12, L2 and L3 01/22/2020. TLSO back brace donned and supine.  -patient may Shower- cover incision- might need 2 TLSOs  4/6- had fall last night- has 8/10 headache and paraspinal soreness/pain- will check CT of head  4/8- recovered from fall- HA gone- CT (-) MRI (-)  4/14- new mild T11 endplate compression fx- no hematoma  4/15- will hold d/c due to medical issues -ELOS/Goals: ~ 8-12 days- goals supervision to min assist   Continue CIR PT, OT 2. Antithrombotics:  -DVT/anticoagulation: bilateral post-tib and peroneal DVT's.    -xarelto initiated yesterday   4/3 -reviewed plan/treatment with patient   4/14- Changed to Heparin gtt because Cr 2.33- will con't until Cr improves   4/15- on Heparin gtt- Cr up to 2.93   4/16- Heprain gtt- because Cr up 3.36 -antiplatelet therapy: N/A  3. Pain Management: Lyrica 150 mg twice daily, Lidoderm patch, Valium as needed muscle spasms  as well as oxycodone as needed   4/12- although pt having spasticity, don't want to sedate her  4/13- stopped baclofen because Cr up to 1.93- don't want to confuse/sedate pt.  4/14- will hold Lyrica to dialysis levels for a few days until Cr improves   4/17- stop Cymbalta (24m daily) and decrease Lyrica to 100 mg QHS; stop Oxycodone 10 mg and con't 5 mg and change valium to 5 mg q12 hours prn due to Cr up to 4.24  4/18-19- improved arousal/cognition  4/20- pt more confused again this AM- not sure if due to low BG of 60.  4. Mood: Cymbalta 60 mg daily - 4/17- stopped-  Due to elevated Cr -antipsychotic agents: N/A  5. Neuropsych: This patient is capable of making decisions on her own behalf.  6. Skin/Wound  Care: Routine skin checks  7. Fluids/Electrolytes/Nutrition: Routine in and outs with follow-up chemistries  8. ID. 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 Follow-up per infectious disease   4/13- pt's Cr keeps bumping up- not sure why but only culprit is Cefipime- will check with ID/pharmacy if any other options?  4/14- ESR >140- con't ABX for now- will consult ID again if needed  4/15- called ID- agreed with my concerns in setting of acute urinary retention to rescan/MRI thoracic and lumbar spine; cannot do with contrast due ot elevated Cr. Will order  4/16- MRIs look OK- renal thinks pt might have new allergy to Cefipime- and is doing w/u for this.   4/17- changed Cefipime to Meropenem pharmacy to dose  4/19 Pt afebrile, WBC's normal, Cr continues to drift up  9. Diabetes mellitus. Lantus insulin as directed.    CBG (last 3)  Recent Labs    02/17/20 0822 02/17/20 0918 02/17/20 1034  GLUCAP 60* 75 99         4/18- BGs low- will change lantus from 25 to 22 units for now  4/19 CBG's still quite low, it appears that lantus was stopped yesterday. Continue to hold  4/20- BGs still low in spite of lantus being stopped- is 60- trying to get it up- might need D5NS, but will  d/w renal 10. CKD stage III. Follow-up chemistries  4/2- Cr stable at 1.24- recheck Monday  4/5- Cr up to 1.61- could be due to being dry- will recheck in AM after 1 unit pRBCs  4/6- Cr down to 1.48- has improved- con't to push fluids  4/8- Cr 1.59- over last 2 weeks, running 1.2-1.6- does have CKD Stage III- but could be IV ABX- will give IVFs low dose x 24 hours- 60cc NS  4/9- Cr down to 1.49 today after IVFs- now off IVFs- con't regimen and recheck Monday  4/13- Cr 1.93- will give blood today and likely IVFs AFTER blood.  4/14- Cr up to 2.33- likely due to urinary retention- Foley placed today- called IM early and they made multiples recs, which were followed out- Xarelto stopped- Heparin gtt started   4/15- Cr up to 2.93- called renal consult to help- also an issue due to urinary retention- placed foley yesterday  4/16- Cr up to 3.36- cefipime allergy per renal, is their concern- supposed to be on it til 5/7- renal ordered more w/u  4/17- Changed Cefipime to Meropenem- Cr up to 4.24 today- hopefully will improve with reduction in pain/mood meds and change in ABX.   4/18- Cr up to 4.94- BUN 54- per d/w Renal, she's not near dialysis at this time- they are hoping it will turn around soon.   4/19 Cr continues to climb 5.47 today, ?interstitial nephritis, Nephro following  4/20- renal on board- Cr up to 5.95- BUN 64- -renal following 11. 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 Wound care left foot as directed. Follow-up per Dr. Sharol Given  12. Acute on chronic anemia. Follow-up CBC   4/1- Hb 7.3- might need transfusion- will monitor   4/4 Recheck CBC 4/5  4/5- CBC Hb 6.7- s/p 1 unit pRBCs- will recheck in AM  4/6- Hb up to 8.1- con't regimen-   4/10: Hgb 8.0  4/13- Hb 6.8- give 1 unit pRBCs- and recheck in AM- check hemoccult and monitor plts- down to 109k  4/14- Will check daily for now- Hb up  to  7.7  4/16- Hb 7.5- will monitor  4/17- Hb down to 7.1 and hemoccult is (+)- transfused 1 unit pRBCs today- IM called GI for [name] for possible GI bleed, esp in setting of DVTs/on IV Heparin.  4/19 received tranfusion, hgb up to 8.6 today  4/20- Hb 8.1-    -pt discussing EGD/colonoscopy with GI. Pt hesitant to pursue colonoscopy. 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 Patient did have assistance at home. Has "seeing eye dog" and husband  68. Constipation. Continue Senokot. MiraLAX as needed. No nausea vomiting   4/13- d/c imodium and start Metamucil per pt request- is appropriate- will also check Hemoccult- since dropping Hb.,   4/14- LBM 2 days ago- nursing to work on.   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 Fall overnight with new confusion  4/6- getting head CT and monitor- strength unchanged- appears in pain and tremors slightly worse- no other changes  4/7- MRI of brain negative for anything acute- still confused per staff- will make baclofen prn; also could be from IV ABX per pharmacist- will monitor closely.   4/8- cognition is better today- MRI (-)  17. L heel wound, unstageable.   4/9- will consult wound care and get healing shoe to take weight off heel and wound care suggestions.  18. Pruritis: Added hydroxyzine 85m TID prn 19. Thrombocytopenia  4/13- down to 109 from 180- will recheck in AM- Needs Xarelto, so if decreases more, will call IM about plan.   4/14- as above.   4/15- Plts up to 127k- will monitor- on Heparin gtt  4/19- plts 124k  4/20 stable at 123k    LOS: 19 days A FACE TO FACE EVALUATION WAS PERFORMED  Zion Ta 02/17/2020, 10:41 AM

## 2020-02-17 NOTE — Progress Notes (Signed)
Occupational Therapy Session Note  Patient Details  Name: [name] [name] MRN: [id number] Date of Birth: January 27, 1956  Today's Date: 02/17/2020 OT Individual Time: 0932-6712 OT Individual Time Calculation (min): 77 min    Short Term Goals: Week 2:  OT Short Term Goal 1 (Week 2): STG = LTGs due to remaining LOS  Skilled Therapeutic Interventions/Progress Updates:    Patient in bed, difficult to arouse this am.  Nursing present and assisting with monitoring / tx for low BS.  MD aware.  Once she was able to follow directions more consistently and move to command, Patient able to roll in bed with min/modA, max A for LB bathing /dressing. Dependent to donn brace.  Side lying to sitting with mod A.  She is able to maintain unsupported sitting with CGA.  SPT to w/c with min/mod A.  Increased difficulty with holding utencils and feeding herself this session.  She remained seated in w/c, seat belt alarm set and call bell/tray table in reach - nursing present at close of session.    Therapy Documentation Precautions:  Precautions Precautions: Back, Fall, Other (comment)(has had issues w/orthostatic hypotension) Precaution Comments: Pt instructed in back precautions, and to self-monitor for activity tolerance, Required Braces or Orthoses: Spinal Brace Spinal Brace: Thoracolumbosacral orthotic, Applied in supine position Restrictions Weight Bearing Restrictions: No General:   Vital Signs: Therapy Vitals Temp: 98.2 F (36.8 C) Temp Source: Oral Pulse Rate: 80 Resp: 17 BP: (!) 114/57 Patient Position (if appropriate): Lying Oxygen Therapy SpO2: 95 % O2 Device: Room Air  Therapy/Group: Individual Therapy  Carlos Levering 02/17/2020, 7:34 AM

## 2020-02-18 ENCOUNTER — Ambulatory Visit (HOSPITAL_COMMUNITY)
Admission: RE | Admit: 2020-02-18 | Payer: PRIVATE HEALTH INSURANCE | Source: Home / Self Care | Admitting: Gastroenterology

## 2020-02-18 ENCOUNTER — Inpatient Hospital Stay (HOSPITAL_COMMUNITY)
Admission: AD | Admit: 2020-02-18 | Discharge: 2020-02-26 | DRG: 982 | Disposition: A | Discharge status: Home / Self Care | Payer: PRIVATE HEALTH INSURANCE | Source: Intra-hospital | Attending: Internal Medicine | Admitting: Internal Medicine

## 2020-02-18 ENCOUNTER — Inpatient Hospital Stay (HOSPITAL_COMMUNITY): Payer: PRIVATE HEALTH INSURANCE

## 2020-02-18 ENCOUNTER — Inpatient Hospital Stay (HOSPITAL_COMMUNITY): Payer: PRIVATE HEALTH INSURANCE | Admitting: Anesthesiology

## 2020-02-18 ENCOUNTER — Encounter (HOSPITAL_COMMUNITY)
Admission: RE | Disposition: A | Discharge status: Other Acute Inpatient Hospital | Payer: Self-pay | Source: Intra-hospital | Attending: Physical Medicine and Rehabilitation

## 2020-02-18 ENCOUNTER — Inpatient Hospital Stay (HOSPITAL_COMMUNITY): Payer: PRIVATE HEALTH INSURANCE | Admitting: Occupational Therapy

## 2020-02-18 DIAGNOSIS — E1122 Type 2 diabetes mellitus with diabetic chronic kidney disease: Secondary | ICD-10-CM | POA: Diagnosis present

## 2020-02-18 DIAGNOSIS — I129 Hypertensive chronic kidney disease with stage 1 through stage 4 chronic kidney disease, or unspecified chronic kidney disease: Secondary | ICD-10-CM | POA: Diagnosis present

## 2020-02-18 DIAGNOSIS — I824Y3 Acute embolism and thrombosis of unspecified deep veins of proximal lower extremity, bilateral: Secondary | ICD-10-CM | POA: Diagnosis not present

## 2020-02-18 DIAGNOSIS — Y830 Surgical operation with transplant of whole organ as the cause of abnormal reaction of the patient, or of later complication, without mention of misadventure at the time of the procedure: Secondary | ICD-10-CM | POA: Diagnosis present

## 2020-02-18 DIAGNOSIS — Z86718 Personal history of other venous thrombosis and embolism: Secondary | ICD-10-CM

## 2020-02-18 DIAGNOSIS — K2970 Gastritis, unspecified, without bleeding: Secondary | ICD-10-CM

## 2020-02-18 DIAGNOSIS — I824Y9 Acute embolism and thrombosis of unspecified deep veins of unspecified proximal lower extremity: Secondary | ICD-10-CM | POA: Diagnosis not present

## 2020-02-18 DIAGNOSIS — T8619 Other complication of kidney transplant: Secondary | ICD-10-CM | POA: Diagnosis present

## 2020-02-18 DIAGNOSIS — K7030 Alcoholic cirrhosis of liver without ascites: Secondary | ICD-10-CM | POA: Diagnosis present

## 2020-02-18 DIAGNOSIS — J45909 Unspecified asthma, uncomplicated: Secondary | ICD-10-CM | POA: Diagnosis present

## 2020-02-18 DIAGNOSIS — Z841 Family history of disorders of kidney and ureter: Secondary | ICD-10-CM

## 2020-02-18 DIAGNOSIS — K5900 Constipation, unspecified: Secondary | ICD-10-CM | POA: Diagnosis present

## 2020-02-18 DIAGNOSIS — D696 Thrombocytopenia, unspecified: Secondary | ICD-10-CM | POA: Diagnosis present

## 2020-02-18 DIAGNOSIS — F329 Major depressive disorder, single episode, unspecified: Secondary | ICD-10-CM | POA: Diagnosis present

## 2020-02-18 DIAGNOSIS — H5462 Unqualified visual loss, left eye, normal vision right eye: Secondary | ICD-10-CM | POA: Diagnosis present

## 2020-02-18 DIAGNOSIS — D500 Iron deficiency anemia secondary to blood loss (chronic): Secondary | ICD-10-CM | POA: Diagnosis present

## 2020-02-18 DIAGNOSIS — Z79891 Long term (current) use of opiate analgesic: Secondary | ICD-10-CM

## 2020-02-18 DIAGNOSIS — M549 Dorsalgia, unspecified: Secondary | ICD-10-CM | POA: Diagnosis not present

## 2020-02-18 DIAGNOSIS — D631 Anemia in chronic kidney disease: Secondary | ICD-10-CM | POA: Diagnosis present

## 2020-02-18 DIAGNOSIS — Z833 Family history of diabetes mellitus: Secondary | ICD-10-CM

## 2020-02-18 DIAGNOSIS — K7581 Nonalcoholic steatohepatitis (NASH): Secondary | ICD-10-CM | POA: Diagnosis present

## 2020-02-18 DIAGNOSIS — Z9049 Acquired absence of other specified parts of digestive tract: Secondary | ICD-10-CM

## 2020-02-18 DIAGNOSIS — R339 Retention of urine, unspecified: Secondary | ICD-10-CM | POA: Diagnosis present

## 2020-02-18 DIAGNOSIS — M47814 Spondylosis without myelopathy or radiculopathy, thoracic region: Secondary | ICD-10-CM | POA: Diagnosis present

## 2020-02-18 DIAGNOSIS — M5126 Other intervertebral disc displacement, lumbar region: Secondary | ICD-10-CM | POA: Diagnosis present

## 2020-02-18 DIAGNOSIS — Z794 Long term (current) use of insulin: Secondary | ICD-10-CM

## 2020-02-18 DIAGNOSIS — Z79899 Other long term (current) drug therapy: Secondary | ICD-10-CM

## 2020-02-18 DIAGNOSIS — K228 Other specified diseases of esophagus: Secondary | ICD-10-CM

## 2020-02-18 DIAGNOSIS — K2100 Gastro-esophageal reflux disease with esophagitis, without bleeding: Secondary | ICD-10-CM | POA: Diagnosis not present

## 2020-02-18 DIAGNOSIS — L97429 Non-pressure chronic ulcer of left heel and midfoot with unspecified severity: Secondary | ICD-10-CM | POA: Diagnosis present

## 2020-02-18 DIAGNOSIS — E785 Hyperlipidemia, unspecified: Secondary | ICD-10-CM | POA: Diagnosis present

## 2020-02-18 DIAGNOSIS — M19021 Primary osteoarthritis, right elbow: Secondary | ICD-10-CM | POA: Diagnosis present

## 2020-02-18 DIAGNOSIS — G629 Polyneuropathy, unspecified: Secondary | ICD-10-CM | POA: Diagnosis present

## 2020-02-18 DIAGNOSIS — G8929 Other chronic pain: Secondary | ICD-10-CM | POA: Diagnosis present

## 2020-02-18 DIAGNOSIS — E11621 Type 2 diabetes mellitus with foot ulcer: Secondary | ICD-10-CM | POA: Diagnosis present

## 2020-02-18 DIAGNOSIS — E8770 Fluid overload, unspecified: Secondary | ICD-10-CM | POA: Diagnosis not present

## 2020-02-18 DIAGNOSIS — Z91013 Allergy to seafood: Secondary | ICD-10-CM

## 2020-02-18 DIAGNOSIS — F4310 Post-traumatic stress disorder, unspecified: Secondary | ICD-10-CM | POA: Diagnosis present

## 2020-02-18 DIAGNOSIS — Z806 Family history of leukemia: Secondary | ICD-10-CM

## 2020-02-18 DIAGNOSIS — Z91030 Bee allergy status: Secondary | ICD-10-CM

## 2020-02-18 DIAGNOSIS — Z888 Allergy status to other drugs, medicaments and biological substances status: Secondary | ICD-10-CM

## 2020-02-18 DIAGNOSIS — Z90710 Acquired absence of both cervix and uterus: Secondary | ICD-10-CM

## 2020-02-18 DIAGNOSIS — N179 Acute kidney failure, unspecified: Secondary | ICD-10-CM | POA: Diagnosis present

## 2020-02-18 DIAGNOSIS — M4620 Osteomyelitis of vertebra, site unspecified: Secondary | ICD-10-CM | POA: Diagnosis not present

## 2020-02-18 DIAGNOSIS — M797 Fibromyalgia: Secondary | ICD-10-CM | POA: Diagnosis present

## 2020-02-18 DIAGNOSIS — M4626 Osteomyelitis of vertebra, lumbar region: Secondary | ICD-10-CM | POA: Diagnosis present

## 2020-02-18 DIAGNOSIS — R338 Other retention of urine: Secondary | ICD-10-CM | POA: Diagnosis not present

## 2020-02-18 DIAGNOSIS — D649 Anemia, unspecified: Secondary | ICD-10-CM | POA: Diagnosis not present

## 2020-02-18 DIAGNOSIS — E7800 Pure hypercholesterolemia, unspecified: Secondary | ICD-10-CM | POA: Diagnosis present

## 2020-02-18 DIAGNOSIS — Z89431 Acquired absence of right foot: Secondary | ICD-10-CM

## 2020-02-18 DIAGNOSIS — Z818 Family history of other mental and behavioral disorders: Secondary | ICD-10-CM

## 2020-02-18 DIAGNOSIS — Z886 Allergy status to analgesic agent status: Secondary | ICD-10-CM

## 2020-02-18 DIAGNOSIS — N1831 Chronic kidney disease, stage 3a: Secondary | ICD-10-CM | POA: Diagnosis present

## 2020-02-18 DIAGNOSIS — G4700 Insomnia, unspecified: Secondary | ICD-10-CM | POA: Diagnosis present

## 2020-02-18 DIAGNOSIS — Z91018 Allergy to other foods: Secondary | ICD-10-CM

## 2020-02-18 DIAGNOSIS — E1165 Type 2 diabetes mellitus with hyperglycemia: Secondary | ICD-10-CM | POA: Diagnosis not present

## 2020-02-18 DIAGNOSIS — Z880 Allergy status to penicillin: Secondary | ICD-10-CM

## 2020-02-18 DIAGNOSIS — Z981 Arthrodesis status: Secondary | ICD-10-CM

## 2020-02-18 DIAGNOSIS — Z8249 Family history of ischemic heart disease and other diseases of the circulatory system: Secondary | ICD-10-CM

## 2020-02-18 HISTORY — PX: BIOPSY: SHX5522

## 2020-02-18 HISTORY — PX: ESOPHAGOGASTRODUODENOSCOPY (EGD) WITH PROPOFOL: SHX5813

## 2020-02-18 LAB — RENAL FUNCTION PANEL
Albumin: 1.8 g/dL — ABNORMAL LOW (ref 3.5–5.0)
Anion gap: 11 (ref 5–15)
BUN: 69 mg/dL — ABNORMAL HIGH (ref 8–23)
CO2: 22 mmol/L (ref 22–32)
Calcium: 8.6 mg/dL — ABNORMAL LOW (ref 8.9–10.3)
Chloride: 103 mmol/L (ref 98–111)
Creatinine, Ser: 6.35 mg/dL — ABNORMAL HIGH (ref 0.44–1.00)
GFR calc Af Amer: 7 mL/min — ABNORMAL LOW (ref >60)
GFR calc non Af Amer: 6 mL/min — ABNORMAL LOW (ref >60)
Glucose, Bld: 125 mg/dL — ABNORMAL HIGH (ref 70–99)
Phosphorus: 3.7 mg/dL (ref 2.5–4.6)
Potassium: 5.2 mmol/L — ABNORMAL HIGH (ref 3.5–5.1)
Sodium: 136 mmol/L (ref 135–145)

## 2020-02-18 LAB — CBC WITH DIFFERENTIAL/PLATELET
Abs Immature Granulocytes: 0.29 10*3/uL — ABNORMAL HIGH (ref 0.00–0.07)
Basophils Absolute: 0 10*3/uL (ref 0.0–0.1)
Basophils Relative: 1 %
Eosinophils Absolute: 0.6 10*3/uL — ABNORMAL HIGH (ref 0.0–0.5)
Eosinophils Relative: 7 %
HCT: 28.3 % — ABNORMAL LOW (ref 36.0–46.0)
Hemoglobin: 8.7 g/dL — ABNORMAL LOW (ref 12.0–15.0)
Immature Granulocytes: 4 %
Lymphocytes Relative: 19 %
Lymphs Abs: 1.4 10*3/uL (ref 0.7–4.0)
MCH: 27.5 pg (ref 26.0–34.0)
MCHC: 30.7 g/dL (ref 30.0–36.0)
MCV: 89.6 fL (ref 80.0–100.0)
Monocytes Absolute: 0.9 10*3/uL (ref 0.1–1.0)
Monocytes Relative: 13 %
Neutro Abs: 4.2 10*3/uL (ref 1.7–7.7)
Neutrophils Relative %: 56 %
Platelets: 131 10*3/uL — ABNORMAL LOW (ref 150–400)
RBC: 3.16 MIL/uL — ABNORMAL LOW (ref 3.87–5.11)
RDW: 17.2 % — ABNORMAL HIGH (ref 11.5–15.5)
WBC: 7.4 10*3/uL (ref 4.0–10.5)
nRBC: 0 % (ref 0.0–0.2)

## 2020-02-18 LAB — BASIC METABOLIC PANEL
Anion gap: 10 (ref 5–15)
BUN: 70 mg/dL — ABNORMAL HIGH (ref 8–23)
CO2: 23 mmol/L (ref 22–32)
Calcium: 8.7 mg/dL — ABNORMAL LOW (ref 8.9–10.3)
Chloride: 106 mmol/L (ref 98–111)
Creatinine, Ser: 6.38 mg/dL — ABNORMAL HIGH (ref 0.44–1.00)
GFR calc Af Amer: 7 mL/min — ABNORMAL LOW (ref >60)
GFR calc non Af Amer: 6 mL/min — ABNORMAL LOW (ref >60)
Glucose, Bld: 96 mg/dL (ref 70–99)
Potassium: 5.1 mmol/L (ref 3.5–5.1)
Sodium: 139 mmol/L (ref 135–145)

## 2020-02-18 LAB — GLUCOSE, CAPILLARY
Glucose-Capillary: 127 mg/dL — ABNORMAL HIGH (ref 70–99)
Glucose-Capillary: 96 mg/dL (ref 70–99)
Glucose-Capillary: 92 mg/dL (ref 70–99)
Glucose-Capillary: 92 mg/dL (ref 70–99)
Glucose-Capillary: 166 mg/dL — ABNORMAL HIGH (ref 70–99)

## 2020-02-18 LAB — MAGNESIUM: Magnesium: 2.4 mg/dL (ref 1.7–2.4)

## 2020-02-18 LAB — HEPARIN LEVEL (UNFRACTIONATED): Heparin Unfractionated: 0.34 IU/mL (ref 0.30–0.70)

## 2020-02-18 SURGERY — ESOPHAGOGASTRODUODENOSCOPY (EGD) WITH PROPOFOL
Anesthesia: Monitor Anesthesia Care

## 2020-02-18 MED ORDER — HEPARIN (PORCINE) 25000 UT/250ML-% IV SOLN
1350.0000 [IU]/h | INTRAVENOUS | Status: DC
  Administered 2020-02-18: 1200 [IU]/h via INTRAVENOUS
  Administered 2020-02-19 – 2020-02-26 (×9): 1350 [IU]/h via INTRAVENOUS
  Filled 2020-02-18 (×10): qty 250

## 2020-02-18 MED ORDER — DIAZEPAM 5 MG PO TABS: 5.0000 mg | ORAL_TABLET | Freq: Two times a day (BID) | ORAL | 0 refills | Status: DC | PRN

## 2020-02-18 MED ORDER — PANTOPRAZOLE SODIUM 40 MG PO TBEC: 40.0000 mg | DELAYED_RELEASE_TABLET | Freq: Every day | ORAL | Status: DC

## 2020-02-18 MED ORDER — INSULIN ASPART 100 UNIT/ML ~~LOC~~ SOLN: 0.0000 [IU] | Freq: Every day | SUBCUTANEOUS | 11 refills | Status: DC

## 2020-02-18 MED ORDER — LIDOCAINE 5 % EX PTCH
1.0000 | MEDICATED_PATCH | CUTANEOUS | Status: DC
  Administered 2020-02-19 – 2020-02-26 (×7): 1 via TRANSDERMAL
  Filled 2020-02-18 (×8): qty 1

## 2020-02-18 MED ORDER — SENNOSIDES-DOCUSATE SODIUM 8.6-50 MG PO TABS
1.0000 | ORAL_TABLET | Freq: Every day | ORAL | Status: DC
  Administered 2020-02-19 – 2020-02-21 (×3): 1 via ORAL
  Filled 2020-02-18 (×3): qty 1

## 2020-02-18 MED ORDER — OXYCODONE HCL 5 MG PO TABS: 5.0000 mg | ORAL_TABLET | ORAL | 0 refills | Status: DC | PRN

## 2020-02-18 MED ORDER — ADULT MULTIVITAMIN W/MINERALS CH: 1.0000 | ORAL_TABLET | Freq: Every day | ORAL | Status: DC

## 2020-02-18 MED ORDER — ACETAMINOPHEN 325 MG PO TABS
650.0000 mg | ORAL_TABLET | ORAL | Status: DC | PRN
  Administered 2020-02-20 – 2020-02-24 (×5): 650 mg via ORAL
  Filled 2020-02-18 (×4): qty 2

## 2020-02-18 MED ORDER — SODIUM CHLORIDE 0.9 % IV SOLN
1.0000 g | INTRAVENOUS | Status: DC
  Administered 2020-02-18 – 2020-02-26 (×8): 1 g via INTRAVENOUS
  Filled 2020-02-18 (×9): qty 1

## 2020-02-18 MED ORDER — PANTOPRAZOLE SODIUM 40 MG PO TBEC
40.0000 mg | DELAYED_RELEASE_TABLET | Freq: Every day | ORAL | Status: DC
  Administered 2020-02-19 – 2020-02-23 (×5): 40 mg via ORAL
  Filled 2020-02-18 (×6): qty 1

## 2020-02-18 MED ORDER — MEROPENEM IV (FOR PTA / DISCHARGE USE ONLY): 1.0000 g | Freq: Two times a day (BID) | INTRAVENOUS | Status: AC

## 2020-02-18 MED ORDER — PROPOFOL 500 MG/50ML IV EMUL
INTRAVENOUS | Status: DC | PRN
  Administered 2020-02-18: 100 ug/kg/min via INTRAVENOUS

## 2020-02-18 MED ORDER — SODIUM CHLORIDE 0.9% FLUSH
10.0000 mL | Freq: Two times a day (BID) | INTRAVENOUS | Status: DC
  Administered 2020-02-18 – 2020-02-26 (×4): 10 mL

## 2020-02-18 MED ORDER — SODIUM CHLORIDE 0.9% FLUSH: 10.0000 mL | INTRAVENOUS | Status: DC | PRN

## 2020-02-18 MED ORDER — HEPARIN (PORCINE) 25000 UT/250ML-% IV SOLN: 1200.0000 [IU]/h | INTRAVENOUS | Status: DC

## 2020-02-18 MED ORDER — PREGABALIN 75 MG PO CAPS: 75.0000 mg | ORAL_CAPSULE | Freq: Every day | ORAL | Status: DC

## 2020-02-18 MED ORDER — INSULIN ASPART 100 UNIT/ML ~~LOC~~ SOLN: 0.0000 [IU] | Freq: Three times a day (TID) | SUBCUTANEOUS | 11 refills | Status: DC

## 2020-02-18 MED ORDER — BETHANECHOL CHLORIDE 10 MG PO TABS: 10.0000 mg | ORAL_TABLET | Freq: Three times a day (TID) | ORAL | Status: DC

## 2020-02-18 MED ORDER — TRAZODONE HCL 50 MG PO TABS: 50.0000 mg | ORAL_TABLET | Freq: Every day | ORAL | Status: DC

## 2020-02-18 MED ORDER — TRAZODONE HCL 50 MG PO TABS
50.0000 mg | ORAL_TABLET | Freq: Every day | ORAL | Status: DC
  Administered 2020-02-18 – 2020-02-23 (×6): 50 mg via ORAL
  Filled 2020-02-18 (×6): qty 1

## 2020-02-18 MED ORDER — PREGABALIN 75 MG PO CAPS
75.0000 mg | ORAL_CAPSULE | Freq: Every day | ORAL | Status: DC
  Administered 2020-02-18 – 2020-02-26 (×8): 75 mg via ORAL
  Filled 2020-02-18 (×8): qty 1

## 2020-02-18 MED ORDER — ATORVASTATIN CALCIUM 10 MG PO TABS
10.0000 mg | ORAL_TABLET | Freq: Every day | ORAL | Status: DC
  Administered 2020-02-19 – 2020-02-26 (×8): 10 mg via ORAL
  Filled 2020-02-18 (×8): qty 1

## 2020-02-18 MED ORDER — FUROSEMIDE 10 MG/ML IJ SOLN: 80.0000 mg | Freq: Two times a day (BID) | INTRAMUSCULAR | 0 refills | Status: DC

## 2020-02-18 MED ORDER — OXYCODONE HCL 5 MG PO TABS
5.0000 mg | ORAL_TABLET | ORAL | Status: DC | PRN
  Administered 2020-02-18 – 2020-02-25 (×21): 5 mg via ORAL
  Filled 2020-02-18 (×22): qty 1

## 2020-02-18 MED ORDER — BETHANECHOL CHLORIDE 10 MG PO TABS
10.0000 mg | ORAL_TABLET | Freq: Three times a day (TID) | ORAL | Status: DC
  Administered 2020-02-19 – 2020-02-26 (×21): 10 mg via ORAL
  Filled 2020-02-18 (×27): qty 1

## 2020-02-18 MED ORDER — ACETAMINOPHEN 325 MG PO TABS: 650.0000 mg | ORAL_TABLET | Freq: Four times a day (QID) | ORAL | Status: DC | PRN

## 2020-02-18 MED ORDER — SODIUM CHLORIDE 0.9 % IV SOLN: 1.0000 g | INTRAVENOUS | Status: DC

## 2020-02-18 MED ORDER — SODIUM CHLORIDE 0.9 % IV SOLN
1.0000 g | INTRAVENOUS | Status: DC
  Filled 2020-02-18: qty 1

## 2020-02-18 MED ORDER — CHLORHEXIDINE GLUCONATE CLOTH 2 % EX PADS
6.0000 | MEDICATED_PAD | Freq: Every day | CUTANEOUS | Status: DC
  Administered 2020-02-18 – 2020-02-26 (×9): 6 via TOPICAL

## 2020-02-18 MED ORDER — INSULIN ASPART 100 UNIT/ML ~~LOC~~ SOLN
0.0000 [IU] | Freq: Three times a day (TID) | SUBCUTANEOUS | Status: DC
  Administered 2020-02-19 – 2020-02-20 (×2): 2 [IU] via SUBCUTANEOUS
  Administered 2020-02-20: 3 [IU] via SUBCUTANEOUS
  Administered 2020-02-21 – 2020-02-22 (×5): 2 [IU] via SUBCUTANEOUS
  Administered 2020-02-22: 5 [IU] via SUBCUTANEOUS
  Administered 2020-02-23: 1 [IU] via SUBCUTANEOUS
  Administered 2020-02-23 (×2): 2 [IU] via SUBCUTANEOUS

## 2020-02-18 MED ORDER — ADULT MULTIVITAMIN W/MINERALS CH
1.0000 | ORAL_TABLET | Freq: Every day | ORAL | Status: DC
  Administered 2020-02-19 – 2020-02-26 (×8): 1 via ORAL
  Filled 2020-02-18 (×8): qty 1

## 2020-02-18 MED ORDER — FUROSEMIDE 10 MG/ML IJ SOLN
80.0000 mg | Freq: Two times a day (BID) | INTRAMUSCULAR | Status: DC
  Administered 2020-02-19 – 2020-02-22 (×7): 80 mg via INTRAVENOUS
  Filled 2020-02-18 (×7): qty 8

## 2020-02-18 MED ORDER — PSYLLIUM 95 % PO PACK
1.0000 | PACK | Freq: Every day | ORAL | Status: DC
  Administered 2020-02-19 – 2020-02-26 (×8): 1 via ORAL
  Filled 2020-02-18 (×9): qty 1

## 2020-02-18 SURGICAL SUPPLY — 14 items

## 2020-02-18 NOTE — Progress Notes (Signed)
Patient transferred to 5[facility]-20 at 8:40 PM. Receiving nurse notified that patient urecholine and lidoderm patch administered prior leaving [facility].

## 2020-02-18 NOTE — Progress Notes (Signed)
Social Work [name], adult   DOB: 07/05/1956, 64 y.o.   MRN: [id number]    Per medical team, pt will transition to acute due to medical reasons.   SW left message for [facility] [phone]) to inform on above.  SW spoke with [facility] [phone]) to inform on above who accepted pt for HHPT/OT.   Loralee Pacas, MSW, [place] Office: [phone] Cell: [phone] Fax: [phone]

## 2020-02-18 NOTE — Progress Notes (Signed)
Report given to [place] on 27M. Patient to be transported to room 20.

## 2020-02-18 NOTE — Progress Notes (Signed)
New Admission Note: ? Arrival Method: via bed Mental Orientation: A/O x 4  Telemetry: Box #10 NSR Assessment: Completed Skin: Refer to flowsheet IV: RUA PICC, and Left FA Pain: 8/10 Tubes: Safety Measures: Safety Fall Prevention Plan discussed with patient. Admission: Completed [facility] [place] Orientation: Patient has been orientated to the room, unit and the staff.  Orders have been reviewed and are being implemented. Will continue to monitor the patient. Call light has been placed within reach and bed alarm has been activated.  ? [employer], [place]

## 2020-02-18 NOTE — Progress Notes (Signed)
Occupational Therapy Session Note  Patient Details  Name: [name] [name] MRN: [id number] Date of Birth: 07/20/1956  Today's Date: 02/18/2020 OT Individual Time: 0700-0710 OT Individual Time Calculation (min): 10 min  and Today's Date: 02/18/2020 OT Missed Time: 50 Minutes Missed Time Reason: Patient fatigue   Short Term Goals: Week 1:  OT Short Term Goal 1 (Week 1): STG=LTG d/t ELOS OT Short Term Goal 1 - Progress (Week 1): Progressing toward goal  Skilled Therapeutic Interventions/Progress Updates:    1:1. Pt difficult to arouse, and requires increased time for processing initially. Once awake pt reporting haning pain in back, legs and stomach. RN alerted. Pt initially agreeable to OT, however after discussing tx options pt reporting too fatigued and declines eating breakfast. Pt wants to drink water and OT scoots pt to [facility] for improved positioning for swallow. Exited session with pt seated in bed, exit alarm on and call light in reach. Pt missed 50 min d/t fatigue. Pt reporting not wanting 7am session and relayed with scheduling  Therapy Documentation Precautions:  Precautions Precautions: Back, Fall, Other (comment)(has had issues w/orthostatic hypotension) Precaution Comments: Pt instructed in back precautions, and to self-monitor for activity tolerance, Required Braces or Orthoses: Spinal Brace Spinal Brace: Thoracolumbosacral orthotic, Applied in supine position Restrictions Weight Bearing Restrictions: No General:   Vital Signs: Therapy Vitals Temp: 98.5 F (36.9 C) Pulse Rate: 78 Resp: 16 BP: 101/67 Patient Position (if appropriate): Sitting Oxygen Therapy SpO2: 100 % O2 Device: Room Air Pain:   ADL: ADL Eating: Set up Where Assessed-Eating: Wheelchair Grooming: Setup Where Assessed-Grooming: Edge of bed Upper Body Bathing: Supervision/safety Where Assessed-Upper Body Bathing: Bed level Lower Body Bathing: Minimal assistance Where Assessed-Lower Body  Bathing: Edge of bed Upper Body Dressing: Moderate assistance(shirt and TLSO in supine) Where Assessed-Upper Body Dressing: Bed level Lower Body Dressing: Minimal assistance Where Assessed-Lower Body Dressing: Edge of bed Toileting: Moderate assistance Where Assessed-Toileting: Bedside Commode Toilet Transfer: Minimal assistance Toilet Transfer Method: Stand pivot Vision   Perception    Praxis   Exercises:   Other Treatments:     Therapy/Group: Individual Therapy  Tonny Branch 02/18/2020, 7:03 AM

## 2020-02-18 NOTE — Anesthesia Procedure Notes (Signed)
Procedure Name: MAC Date/Time: 02/18/2020 4:25 PM Performed by: Griffin Dakin, CRNA Pre-anesthesia Checklist: Patient identified, Emergency Drugs available, Suction available, Patient being monitored and Timeout performed Patient Re-evaluated:Patient Re-evaluated prior to induction Oxygen Delivery Method: Nasal cannula Induction Type: IV induction Placement Confirmation: positive ETCO2 Dental Injury: Teeth and Oropharynx as per pre-operative assessment

## 2020-02-18 NOTE — Progress Notes (Signed)
ANTICOAGULATION CONSULT NOTE - Follow Up Consult  Pharmacy Consult for heparin Indication: DVT  Allergies  Allergen Reactions  . Bee Pollen Anaphylaxis  . Fish-Derived Products Hives, Shortness Of Breath, Swelling and Rash    Hives get in throat causing trouble breathing  . Mushroom Extract Complex Anaphylaxis  . Penicillins Anaphylaxis    **Tolerated cefepime March 2021 Did it involve swelling of the face/tongue/throat, SOB, or low BP? Yes Did it involve sudden or severe rash/hives, skin peeling, or any reaction on the inside of your mouth or nose? No Did you need to seek medical attention at a hospital or doctor's office? Yes When did it last happen?A few months ago If all above answers are "NO", may proceed with cephalosporin use.  [name] Kitchen Rosemary Oil Anaphylaxis  . Shellfish Allergy Hives, Shortness Of Breath, Swelling and Rash  . Tomato Hives and Shortness Of Breath    Hives in throat causes her trouble breathing  . Acetaminophen Other (See Comments)    GI upset  . Acyclovir And Related Other (See Comments)    Unknown rxn  . Aloe Vera Hives  . Broccoli [Brassica Oleracea] Hives  . Naproxen Other (See Comments)    Unknown rxn    Patient Measurements: Wt: 81.7 kg   Vital Signs: Temp: 98.5 F (36.9 C) (04/21 2132) Temp Source: Oral (04/21 2132) BP: 143/68 (04/21 2132) Pulse Rate: 78 (04/21 2132)  Labs: Recent Labs    02/16/20 0416 02/16/20 0416 02/16/20 1229 02/17/20 0500 02/18/20 0411 02/18/20 0412 02/18/20 1752  HGB 8.6*   < >  --  8.1* 8.7*  --   --   HCT 27.9*  --   --  26.6* 28.3*  --   --   PLT 124*  --   --  123* 131*  --   --   HEPARINUNFRC 0.29*   < > 0.43 0.36  --  0.34  --   CREATININE 5.47*   < >  --  5.95* 6.35*  --  6.38*   < > = values in this interval not displayed.    Estimated Creatinine Clearance (by C-G formula based on SCr of 6.38 mg/dL (H)) Female: 8.9 mL/min (A) Female: 10.9 mL/min (A)  Assessment: 60 YOF with new bilateral DVTs.  Pharmacy consulted for IV heparin; Xarelto discontinued for worsening renal function. Of note, patient fell recently but CT head was negative.   Heparin level is therapeutic at 0.34 on 1200 units/hr. No bleeding noted, Hgb low but stable at 8.7, platelets 131. No infusion issues or s/sx of bleeding.   Resume 4 hours post procedure   Goal of Therapy:  Heparin level 0.3-0.7 units/ml Monitor platelets by anticoagulation protocol: Yes   Plan:  Restart heparin drip at 1200 units/hr 4 hr after GI procedure which is at 2045 today Daily heparin level and CBC Monitor for s/sx of bleeding  Thank you for involving pharmacy in this patient's care.  Alanda Slim, PharmD, [facility] Clinical Pharmacist Please see AMION for all Pharmacists' Contact Phone Numbers 02/18/2020, 10:46 PM    ADDENDUM:  Patient transferred from rehab to inpatient. Pharmacy reconsulted to start heparin drip.  Plan:  Restart heparin drip at 1200 units/hr  Draw heparin level in 8 hours Daily heparin level and CBC Monitor for s/sx of bleeding  Thank you for involving pharmacy in this patient's care.  Alanda Slim, PharmD, [facility] Clinical Pharmacist Please see AMION for all Pharmacists' Contact Phone Numbers 02/18/2020, 10:46 PM

## 2020-02-18 NOTE — Progress Notes (Signed)
ANTICOAGULATION CONSULT NOTE - Follow Up Consult  Pharmacy Consult for heparin Indication: DVT  Allergies  Allergen Reactions  . Bee Pollen Anaphylaxis  . Fish-Derived Products Hives, Shortness Of Breath, Swelling and Rash    Hives get in throat causing trouble breathing  . Mushroom Extract Complex Anaphylaxis  . Penicillins Anaphylaxis    **Tolerated cefepime March 2021 Did it involve swelling of the face/tongue/throat, SOB, or low BP? Yes Did it involve sudden or severe rash/hives, skin peeling, or any reaction on the inside of your mouth or nose? No Did you need to seek medical attention at a hospital or doctor's office? Yes When did it last happen?A few months ago If all above answers are "NO", may proceed with cephalosporin use.  [name] Kitchen Rosemary Oil Anaphylaxis  . Shellfish Allergy Hives, Shortness Of Breath, Swelling and Rash  . Tomato Hives and Shortness Of Breath    Hives in throat causes her trouble breathing  . Acetaminophen Other (See Comments)    GI upset  . Acyclovir And Related Other (See Comments)    Unknown rxn  . Aloe Vera Hives  . Broccoli [Brassica Oleracea] Hives  . Naproxen Other (See Comments)    Unknown rxn    Patient Measurements: Wt: 81.7 kg   Vital Signs: Temp: 97.8 F (36.6 C) (04/21 1737) Temp Source: Temporal (04/21 1639) BP: 153/81 (04/21 1737) Pulse Rate: 74 (04/21 1737)  Labs: Recent Labs    02/16/20 0416 02/16/20 0416 02/16/20 1229 02/17/20 0500 02/18/20 0411 02/18/20 0412 02/18/20 1752  HGB 8.6*   < >  --  8.1* 8.7*  --   --   HCT 27.9*  --   --  26.6* 28.3*  --   --   PLT 124*  --   --  123* 131*  --   --   HEPARINUNFRC 0.29*   < > 0.43 0.36  --  0.34  --   CREATININE 5.47*   < >  --  5.95* 6.35*  --  6.38*   < > = values in this interval not displayed.    Estimated Creatinine Clearance (by C-G formula based on SCr of 6.38 mg/dL (H)) Female: 8.9 mL/min (A) Female: 10.9 mL/min (A)  Assessment: 71 YOF with new bilateral  DVTs. Pharmacy consulted for IV heparin; Xarelto discontinued for worsening renal function. Of note, patient fell recently but CT head was negative.   Heparin level is therapeutic at 0.34 on 1200 units/hr. No bleeding noted, Hgb low but stable at 8.7, platelets 131. No infusion issues or s/sx of bleeding.   Resume 4 hours post procedure   Goal of Therapy:  Heparin level 0.3-0.7 units/ml Monitor platelets by anticoagulation protocol: Yes   Plan:  Restart heparin drip at 1200 units/hr 4 hr after GI procedure which is at 2045 today Daily heparin level and CBC Monitor for s/sx of bleeding  Thank you for involving pharmacy in this patient's care.  Alanda Slim, PharmD, [facility] Clinical Pharmacist Please see AMION for all Pharmacists' Contact Phone Numbers 02/18/2020, 6:39 PM
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
## 2020-02-18 NOTE — Progress Notes (Signed)
[facility]IC[facility] REH[facility]ILITATION PROGRESS NOTE   Subjective/Complaints:   Pt reports yesterday/today were the first times she literally COULDN"T transfer OOB with therapy since she's been here, she's so weak.   She doesn't know why, but feels bad- also, it's painful to have BM due to the foley- can't get comfortable- sounds like foley needs to be readjusted.   Asked CNA to help with foley.  Does note had SMALL BM last night and maybe this AM.   ROS:   Pt denies SOB, abd pain, CP, N/V/C/D, and vision changes    Objective:   No results found. Recent Labs    02/17/20 0500 02/18/20 0411  WBC 6.2 7.4  HGB 8.1* 8.7*  HCT 26.6* 28.3*  PLT 123* 131*   Recent Labs    02/17/20 0500 02/18/20 0411  NA 135 136  K 5.0 5.2*  CL 104 103  CO2 20* 22  GLUCOSE 81 125*  BUN 64* 69*  CREATININE 5.95* 6.35*  CALCIUM 8.3* 8.6*    Intake/Output Summary (Last 24 hours) at 02/18/2020 1806 Last data filed at 02/18/2020 1300 Gross per 24 hour  Intake 60 ml  Output 3200 ml  Net -3140 ml     Physical Exam: Vital Signs Blood pressure (!) 153/81, pulse 74, temperature 97.8 F (36.6 C), resp. rate 17, weight 86.5 kg, SpO2 94 %. Constitutional: No distress . Pt laying in bed; near tears most of time was there, more appropriate today- less confused; No acute distress, but looks ill,  HEENT:conjugate gaze Neck: supple Cardiovascular: RRR-   Respiratory/Chest: slightly coarse breath sounds- good air movement B/L GI/Abdomen: soft, but distended, stable for past few days; (+)BS hypoactive Ext: no clubbing, cyanosis, or edema Psych:more alert, oriented, but tearful Musc: not wearing TLSO- laying down Comments: RUE- biceps 4/5, triceps 4/5, WE 4/5, grip 4-/5, finger abd 3+/5- all strength rechecked and unchanged from last check LUE- biceps 4+/5, triceps 4+/5, WE 5-/5, grip 4+/5, finger abd 4+/5 (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 CN exam intact.  Skin:  Back incision not visualized as she was in brace L heel ulcer- bandaged C/D/I Healed scars on R elbow and R foot      Assessment/Plan: 1. Functional deficits secondary to osteomyelitis of vertebral of thoracolumbar region. Status post L1 corpectomy with pedicle screw fixation T11, 12, L2 and L3 01/22/2020 which require 3+ hours per day of interdisciplinary therapy in a comprehensive inpatient rehab setting.  Physiatrist is providing close team supervision and 24 hour management of active medical problems listed below.  Physiatrist and rehab team continue to assess barriers to discharge/monitor patient progress toward functional and medical goals  Care Tool:  Bathing    Body parts bathed by patient: Right arm, Left arm, Chest, Abdomen, Front perineal area, Right upper leg, Left upper leg, Face, Right lower leg, Left lower leg   Body parts bathed by helper: Front perineal area, Buttocks     Bathing assist Assist Level: Maximal Assistance - Patient 24 - 49%     Upper Body Dressing/Undressing Upper body dressing   What is the patient wearing?: Pull over shirt, Orthosis Orthosis activity level: Performed by helper  Upper body assist Assist Level: Minimal Assistance - Patient > 75%    Lower Body Dressing/Undressing Lower body dressing  What is the patient wearing?: Underwear/pull up, Pants     Lower body assist Assist for lower body dressing: Maximal Assistance - Patient 25 - 49%     Toileting Toileting    Toileting assist Assist for toileting: Moderate Assistance - Patient 50 - 74%     Transfers Chair/bed transfer  Transfers assist     Chair/bed transfer assist level: Minimal Assistance - Patient > 75% Chair/bed transfer assistive device: Arboriculturist   Ambulation assist      Assist level: Minimal Assistance - Patient > 75% Assistive device: Walker-rolling Max distance: 25 ft   Walk 10 feet activity   Assist     Assist level: Minimal Assistance - Patient > 75% Assistive device: Walker-rolling   Walk 50 feet activity   Assist Walk 50 feet with 2 turns activity did not occur: Safety/medical concerns  Assist level: Contact Guard/Touching assist Assistive device: Walker-rolling    Walk 150 feet activity   Assist Walk 150 feet activity did not occur: Safety/medical concerns         Walk 10 feet on uneven surface  activity   Assist     Assist level: Moderate Assistance - Patient - 50 - 74% Assistive device: Aeronautical engineer Will patient use wheelchair at discharge?: No             Wheelchair 50 feet with 2 turns activity    Assist            Wheelchair 150 feet activity     Assist          Blood pressure (!) 153/81, pulse 74, temperature 97.8 F (36.6 C), resp. rate 17, weight 86.5 kg, SpO2 94 %.  Medical Problem List and Plan:  1. Decreased functional mobility secondary to osteomyelitis of vertebral of thoracolumbar region. Status post L1 corpectomy with pedicle screw fixation T11, 12, L2 and L3 01/22/2020. TLSO back brace donned and supine.  -patient may Shower- cover incision- might need 2 TLSOs  4/6- had fall last night- has 8/10 headache and paraspinal soreness/pain- will check CT of head  4/8- recovered from fall- HA gone- CT (-) MRI (-)  4/14- new mild T11 endplate compression fx- no hematoma  4/15- will hold d/c due to medical issues  4/21- called Dr from IM- team 10- was told he would accept pt today- however hasn't been moved yet. Will call and check - just bed issues, they are trying ot get her moved- did have EGD today as well- no results so far -ELOS/Goals: ~ 8-12 days- goals supervision to min assist   Continue CIR  PT, OT 2. Antithrombotics:  -DVT/anticoagulation: bilateral post-tib and peroneal DVT's.    -xarelto initiated yesterday   4/3 -reviewed plan/treatment with patient   4/14- Changed to Heparin gtt because Cr 2.33- will con't until Cr improves   4/15- on Heparin gtt- Cr up to 2.93   4/16- Heprain gtt- because Cr up 3.36  4/21- Cr up to 6.36- Heparin gtt on hold for EGD today and then restarted sometimes within 24 hours- per pharmacy -antiplatelet therapy: N/A  3. Pain Management: Lyrica 150 mg twice daily, Lidoderm patch, Valium as needed muscle spasms as well as oxycodone as needed   4/12- although pt having spasticity, don't want to sedate her  4/13- stopped baclofen because Cr up to 1.93- don't want to confuse/sedate pt.  4/14- will hold Lyrica to dialysis levels for a few days  until Cr improves   4/17- stop Cymbalta (27m daily) and decrease Lyrica to 100 mg QHS; stop Oxycodone 10 mg and con't 5 mg and change valium to 5 mg q12 hours prn due to Cr up to 4.24  4/18-19- improved arousal/cognition  4/20- pt more confused again this AM- not sure if due to low BG of 60.   4/21- pt more with it- likely due to low BG 4. Mood: Cymbalta 60 mg daily - 4/17- stopped-  Due to elevated Cr -antipsychotic agents: N/A  5. Neuropsych: This patient is capable of making decisions on her own behalf.  6. Skin/Wound Care: Routine skin checks  7. Fluids/Electrolytes/Nutrition: Routine in and outs with follow-up chemistries  8. ID. 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 Follow-up per infectious disease   4/13- pt's Cr keeps bumping up- not sure why but only culprit is Cefipime- will check with ID/pharmacy if any other options?  4/14- ESR >140- con't ABX for now- will consult ID again if needed  4/15- called ID- agreed with my concerns in setting of acute urinary retention to rescan/MRI thoracic and lumbar spine; cannot do with contrast due ot elevated Cr. Will order  4/16- MRIs look OK- renal  thinks pt might have new allergy to Cefipime- and is doing w/u for this.   4/17- changed Cefipime to Meropenem pharmacy to dose  4/19 Pt afebrile, WBC's normal, Cr continues to drift up  9. Diabetes mellitus. Lantus insulin as directed.    CBG (last 3)  Recent Labs    02/18/20 1137 02/18/20 1442 02/18/20 1742  GLUCAP 96 92 92         4/18- BGs low- will change lantus from 25 to 22 units for now  4/19 CBG's still quite low, it appears that lantus was stopped yesterday. Continue to hold  4/20- BGs still low in spite of lantus being stopped- is 60- trying to get it up- might need D5NS, but will d/w renal  4/21- BGs better ~92- today 10. CKD stage III. Follow-up chemistries  4/2- Cr stable at 1.24- recheck Monday  4/5- Cr up to 1.61- could be due to being dry- will recheck in AM after 1 unit pRBCs  4/6- Cr down to 1.48- has improved- con't to push fluids  4/8- Cr 1.59- over last 2 weeks, running 1.2-1.6- does have CKD Stage III- but could be IV ABX- will give IVFs low dose x 24 hours- 60cc NS  4/9- Cr down to 1.49 today after IVFs- now off IVFs- con't regimen and recheck Monday  4/13- Cr 1.93- will give blood today and likely IVFs AFTER blood.  4/14- Cr up to 2.33- likely due to urinary retention- Foley placed today- called IM early and they made multiples recs, which were followed out- Xarelto stopped- Heparin gtt started   4/15- Cr up to 2.93- called renal consult to help- also an issue due to urinary retention- placed foley yesterday  4/16- Cr up to 3.36- cefipime allergy per renal, is their concern- supposed to be on it til 5/7- renal ordered more w/u  4/17- Changed Cefipime to Meropenem- Cr up to 4.24 today- hopefully will improve with reduction in pain/mood meds and change in ABX.   4/18- Cr up to 4.94- BUN 54- per d/w Renal, she's not near dialysis at this time- they are hoping it will turn around soon.   4/19 Cr continues to climb 5.47 today,  4/21- Cr up to 6.36 today renal still  following.    ?interstitial  nephritis, Nephro following  4/20- renal on board- Cr up to 5.95- BUN 64- -renal following 11. 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 Wound care left foot as directed. Follow-up per Dr. Sharol Given  12. Acute on chronic anemia. Follow-up CBC   4/1- Hb 7.3- might need transfusion- will monitor   4/4 Recheck CBC 4/5  4/5- CBC Hb 6.7- s/p 1 unit pRBCs- will recheck in AM  4/6- Hb up to 8.1- con't regimen-   4/10: Hgb 8.0  4/13- Hb 6.8- give 1 unit pRBCs- and recheck in AM- check hemoccult and monitor plts- down to 109k  4/14- Will check daily for now- Hb up to 7.7  4/16- Hb 7.5- will monitor  4/17- Hb down to 7.1 and hemoccult is (+)- transfused 1 unit pRBCs today- IM called GI for [name] for possible GI bleed, esp in setting of DVTs/on IV Heparin.  4/19 received tranfusion, hgb up to 8.6 today  4/20- Hb 8.1-    -pt discussing EGD/colonoscopy with GI. Pt hesitant to pursue colonoscopy. 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 Patient did have assistance at home. Has "seeing eye dog" and husband  80. Constipation. Continue Senokot. MiraLAX as needed. No nausea vomiting   4/13- d/c imodium and start Metamucil per pt request- is appropriate- will also check Hemoccult- since dropping Hb.,   4/14- LBM 2 days ago- nursing to work on.   4/16- LBM 2 days ago- nursing working on with prns  4/17- BM this AM  4/19 moved bowels 4/18  4/21- pt needs a BM- foley is "bothering her" with having BM- asked nursing to adjust 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 Fall overnight with new confusion  4/6- getting head CT and monitor- strength unchanged- appears in pain and tremors slightly worse- no other changes  4/7- MRI of brain negative for anything acute- still confused per staff- will  make baclofen prn; also could be from IV ABX per pharmacist- will monitor closely.   4/8- cognition is better today- MRI (-)  17. L heel wound, unstageable.   4/9- will consult wound care and get healing shoe to take weight off heel and wound care suggestions.  18. Pruritis: Added hydroxyzine 48m TID prn 19. Thrombocytopenia  4/13- down to 109 from 180- will recheck in AM- Needs Xarelto, so if decreases more, will call IM about plan.   4/14- as above.   4/15- Plts up to 127k- will monitor- on Heparin gtt  4/19- plts 124k  4/20 stable at 123k    LOS: 20 days A FACE TO FACE EVALUATION WAS PERFORMED  Elliemae Braman 02/18/2020, 6:06 PM

## 2020-02-18 NOTE — Discharge Summary (Signed)
Physician Discharge Summary  [name] MRN: [id number] DOB/AGE: [birth year] 64 y.o.  Admit date: 01/29/2020 Discharge date: 02/18/2020  Discharge Diagnoses:  Principal Problem:   Osteomyelitis of vertebra of thoracolumbar region [facility]) Active Problems:   Hyperlipidemia   THROMBOCYTOPENIA   Essential hypertension   Hepatic cirrhosis (HCC)   CAD (coronary artery disease)   Normocytic anemia   Obesity (BMI 30-39.9)   Ulcer of left heel, limited to breakdown of skin ([place])   Type 2 diabetes mellitus with hyperglycemia, with long-term current use of insulin (HCC)   AKI (acute kidney injury) ([place]) Bilateral post tibial and peroneal DVTs Pain management  Discharged Condition: Guarded  Significant Diagnostic Studies: CT ABDOMEN PELVIS WO CONTRAST  Result Date: 02/11/2020 CLINICAL DATA:  64 year old female with pathologic L1 compression fracture last month with abnormal adjacent T12 and L2 segments, treated with operative corpectomy and posterior fusion, with cultures positive for Pseudomonas. Low back pain, query improvement/hematoma. EXAM: CT ABDOMEN AND PELVIS WITHOUT CONTRAST TECHNIQUE: Multidetector CT imaging of the abdomen and pelvis was performed following the standard protocol without IV contrast. COMPARISON:  Preoperative thoracic and lumbar MRI 01/20/2020 and CT Abdomen and Pelvis that day. FINDINGS: Lower chest: Increased small bilateral pleural effusions with mild lung base atelectasis. No pericardial effusion. Calcified coronary artery atherosclerosis and/or stents. Hepatobiliary: Nodular liver contour suggesting cirrhosis (series 3, image 28). Surgically absent gallbladder. Scattered small calcified granulomas in the liver with no other discrete liver lesion in the absence of IV contrast. Pancreas: Negative. Spleen: Stable mild splenomegaly. Occasional calcified splenic granulomas. Adrenals/Urinary Tract: Normal adrenal glands. Noncontrast kidneys appear stable with  no hydronephrosis or hydroureter. The urinary bladder is distended, estimated bladder volume 642 mL. No perivesical stranding. No urinary calculus identified. Stomach/Bowel: No dilated large or small bowel. Redundant large bowel with retained stool throughout, although not significantly changed from last month. Normal retrocecal appendix on series 3, image 73. Negative stomach. No free air. No free fluid. Vascular/Lymphatic: Vascular patency is not evaluated in the absence of IV contrast. Aortoiliac calcified atherosclerosis. Stable mild reactive appearing retroperitoneal lymph nodes. Reproductive: Surgically absent uterus and diminutive or absent ovaries. Other: No pelvic free fluid. Mild generalized increased flank subcutaneous edema. Musculoskeletal: Sequelae of L1 corpectomy and posterior fusion hardware T11 through L3. Mild erosion of the adjacent T12 and L2 endplates appears stable from the preoperative MRI. No adverse hardware features. There is mild paraspinal soft tissue stranding, and some left psoas muscle gas at the corpectomy level. No discrete postoperative hematoma. But there is new T11 superior endplate compression on series 6, image 13. The T11 pedicle screws do closely approximate the superior endplate. Underlying osteopenia. No other new osseous abnormality. IMPRESSION: 1. Sequelae of L1 corpectomy and posterior fusion with hardware T11 through L3. No hardware loosening identified, but there is a new mild T11 superior endplate compression fracture. Mild erosion of the T12 and L2 endplates appears stable from the preoperative MRI. Mild lumbar paraspinal inflammation with no discrete postoperative hematoma. 2. Increased small bilateral pleural effusions with mild lung base atelectasis. 3. Cirrhosis with mild splenomegaly. 4. Distended urinary bladder, estimated at 642 mL. 5. Aortic Atherosclerosis (ICD10-I70.0). Electronically Signed   By: Genevie Ann M.D.   On: 02/11/2020 13:56   CT ABDOMEN PELVIS WO  CONTRAST  Result Date: 01/20/2020 CLINICAL DATA:  Lower abdominal pain, constipation EXAM: CT ABDOMEN AND PELVIS WITHOUT CONTRAST TECHNIQUE: Multidetector CT imaging of the abdomen and pelvis was performed following the standard protocol without IV contrast. COMPARISON:  11/15/2019 FINDINGS: Lower chest: No acute pleural or parenchymal lung disease. Hepatobiliary: Nodular contour of the liver compatible with cirrhosis, stable. Calcified granulomata are seen within the liver parenchyma. The gallbladder is surgically absent. Pancreas: Unremarkable. No pancreatic ductal dilatation or surrounding inflammatory changes. Spleen: Normal in size without focal abnormality. Adrenals/Urinary Tract: No urinary tract calculi or obstructive uropathy. The bladder is unremarkable. The adrenals are normal. Stomach/Bowel: No bowel obstruction or ileus. Moderate fecal retention. Normal appendix right lower quadrant. No bowel wall thickening or inflammatory changes. Vascular/Lymphatic: Multiple subcentimeter lymph nodes are seen within the retroperitoneum, increased in number since prior study. No pathologically enlarged lymph nodes. Vascular calcifications are again seen throughout the aorta and its branches. Reproductive: Status post hysterectomy. No adnexal masses. Other: There is no free fluid or free gas within the abdomen or pelvis. Musculoskeletal: Since the prior exam, there is been progression of the compression deformity at L1, with marked bony resorption and development of vertebral plana. There is also cortical erosion within the superior endplate of L2 and inferior endplate of U93. Overall, the appearance is highly concerning for progressive discitis/osteomyelitis. There is mild retropulsion of the L1 vertebral body with at least 50% narrowing of the central canal at the L1 level. MRI is recommended for further evaluation and to better assess to the mass effect upon the central canal and neural foramina. There is no  obvious fluid collection or abscess. Effacement of the fat planes between the vertebral bodies and paraspinous musculature. IMPRESSION: 1. Interval development of L1 vertebral plana with marked bony resorption of the L1 vertebral body as well as erosive changes of the inferior T12 endplate and superior L2 endplate. Overall, the appearance and rapid progression is concerning for underlying discitis/osteomyelitis. 2. Slight retropulsion and phlegmon at the L1 level results in at least 50% narrowing of the central canal. MRI is recommended for further evaluation. 3. No evidence of paraspinal abscess on this unenhanced exam. There is effacement of the fat planes between the upper lumbar spine and paraspinous musculature. Again, MRI would be useful for further evaluation. 4. Moderate fecal retention. 5. Cirrhosis. These results were called by telephone at the time of interpretation on 01/20/2020 at 3:13 pm to provider [facility] , who verbally acknowledged these results. Electronically Signed   By: Randa Ngo M.D.   On: 01/20/2020 15:13   DG Thoracic Spine 2 View  Result Date: 01/23/2020 CLINICAL DATA:  Fusion of spine, thoraco lung in. EXAM: THORACIC SPINE 2 VIEWS COMPARISON:  Thoracic spine MRI 01/20/2020 FINDINGS: A right IJ approach central venous catheter appears to terminate in the region of the right brachiocephalic vein. Surgical clips within the right upper quadrant of the abdomen. Vertebral body height is maintained. Partially imaged thoracolumbar posterior spinal fusion construct spanning the T11-L3 levels with L1 corpectomy cage. The fusion construct is not demonstrate any adverse features. No new compression deformity. IMPRESSION: T11-L3 posterior spinal fusion construct with L1 corpectomy cage. The fusion construct demonstrates no adverse features. No new thoracic compression deformity. A right IJ approach central venous catheter appears to terminate in the region of the right brachiocephalic  vein. Correlate with desired positioning. Electronically Signed   By: Kellie Simmering DO   On: 01/23/2020 07:42   DG Lumbar Spine 2-3 Views  Result Date: 01/23/2020 CLINICAL DATA:  Fusion of spine, thoracolumbar region. EXAM: LUMBAR SPINE - 2-3 VIEW COMPARISON:  Lumbar spine MRI 01/20/2020 FINDINGS: Surgical clips within the right upper quadrant of the abdomen. Posterior spinal fusion construct spanning  the T11-L3 levels with L1 corpectomy cage. The fusion construct demonstrates no adverse features. Redemonstrated irregularity of the L2 superior endplate with mild height loss. No new compression deformity. IMPRESSION: T11-L3 posterior spinal fusion construct with L1 corpectomy cage. The fusion construct demonstrates no adverse features. Redemonstrated irregularity of the L2 superior endplate with mild height loss. No new lumbar compression deformity. Electronically Signed   By: Kellie Simmering DO   On: 01/23/2020 07:43   DG Lumbar Spine 2-3 Views  Result Date: 01/22/2020 CLINICAL DATA:  Lumbar spine surgery. EXAM: LUMBAR SPINE - 2-3 VIEW; DG C-ARM 1-60 MIN COMPARISON:  CT 11/15/2019 and MRI 01/20/2020 FINDINGS: Eleven spot intraoperative images of the lumbar spine demonstrate placement of a spacer over the previous site of patient's known L1 compression fracture with associated posterior fusion hardware from T11 to at least L3 but possibly extending below the L3 level. Hardware is intact. There is normal alignment of the visualized thoracolumbar spine. IMPRESSION: Intraoperative findings as described with posterior fusion hardware intact from T11 to at least the L3 level bridging a spacer device at the L1 level. Recommend correlation with findings at the time of the procedure. Electronically Signed   By: Marin Olp M.D.   On: 01/22/2020 20:27   CT HEAD WO CONTRAST  Result Date: 02/03/2020 CLINICAL DATA:  Head trauma, fall EXAM: CT HEAD WITHOUT CONTRAST TECHNIQUE: Contiguous axial images were obtained from  the base of the skull through the vertex without intravenous contrast. COMPARISON:  11/12/2019 FINDINGS: Brain: No evidence of acute infarction, hemorrhage, hydrocephalus, extra-axial collection or mass lesion/mass effect. Vascular: No hyperdense vessel or unexpected calcification. Skull: Normal. Negative for fracture or focal lesion. Sinuses/Orbits: No acute finding. Other: None. IMPRESSION: No acute intracranial pathology. Electronically Signed   By: Eddie Candle M.D.   On: 02/03/2020 10:38   MR BRAIN WO CONTRAST  Result Date: 02/04/2020 CLINICAL DATA:  Delirium, fall with headache EXAM: MRI HEAD WITHOUT CONTRAST TECHNIQUE: Multiplanar, multiecho pulse sequences of the brain and surrounding structures were obtained without intravenous contrast. COMPARISON:  2015 FINDINGS: Brain: There is no acute infarction or intracranial hemorrhage. There is no parenchymal mass. 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 Ventricles are normal in size and configuration. There is no edema, hydrocephalus, or extra-axial fluid collection. Minimal small foci of T2 hyperintensity in the supratentorial white matter are nonspecific but may reflect minor chronic microvascular ischemic changes. Vascular: Major vessel flow voids at the skull base are preserved. Skull and upper cervical spine: Normal marrow signal is preserved. Sinuses/Orbits: Mild ethmoid mucosal thickening. Left lens replacement. Other: Sella is unremarkable. Mild patchy mastoid fluid opacification. Degenerative changes of the temporomandibular joints. IMPRESSION: No acute infarction, hemorrhage, or mass. Stable chronic findings detailed above. Electronically Signed   By: Macy Mis M.D.   On: 02/04/2020 11:08   MR THORACIC SPINE WO CONTRAST  Result Date: 02/12/2020 CLINICAL DATA:  Thoracic compression fracture. Osteomyelitis. EXAM: MRI THORACIC AND LUMBAR SPINE WITHOUT CONTRAST TECHNIQUE: Multiplanar and multiecho pulse  sequences of the thoracic and lumbar spine were obtained without intravenous contrast. COMPARISON:  None. FINDINGS: MRI THORACIC SPINE FINDINGS Alignment:  Normal Vertebrae: Posterior instrumented spinal fusion beginning at T11. Vertebrae are otherwise normal. Cord:  Normal Paraspinal and other soft tissues: Bilateral pleural effusions Disc levels: No spinal canal or neural foraminal stenosis. MRI LUMBAR SPINE FINDINGS Segmentation:  Standard. Alignment:  Physiologic. Vertebrae: Susceptibility artifact from spinal hardware obscures the T12-L2 levels and to a lesser extent the  T11 and L3 levels. No visible bone marrow abnormality. Conus medullaris and cauda equina: Conus extends to the L1 level. Conus and cauda equina appear normal. Paraspinal and other soft tissues: There is edema within the posterior soft tissues. No abscess. Disc levels: Assessment is limited by susceptibility effects and mild motion. L1-2: No spinal canal stenosis. Neural foramina are obscured. L2-3: Widely patent spinal. No neural foraminal stenosis. L3-4: No spinal canal stenosis. Mild left foraminal stenosis due to asymmetric disc bulge. L4-5: Right asymmetric disc bulge with narrowing of the right lateral recess. No neural foraminal stenosis. L5-S1: No spinal canal or neural foraminal stenosis. IMPRESSION: 1. Posterior instrumented spinal fusion at T11-L3 with corpectomy at L1. Associated susceptibility artifact that obscures the T12-L2 level and to a lesser extent T11-12 levels. Within that limitation, no visible evidence of discitis-osteomyelitis. 2. No thoracic or lumbar spinal canal stenosis. 3. Nonspecific edema within the posterior soft tissues. No abscess. 4. Bilateral pleural effusions. Electronically Signed   By: Ulyses Jarred M.D.   On: 02/12/2020 21:08   MR LUMBAR SPINE WO CONTRAST  Result Date: 02/12/2020 CLINICAL DATA:  Thoracic compression fracture. Osteomyelitis. EXAM: MRI THORACIC AND LUMBAR SPINE WITHOUT CONTRAST  TECHNIQUE: Multiplanar and multiecho pulse sequences of the thoracic and lumbar spine were obtained without intravenous contrast. COMPARISON:  None. FINDINGS: MRI THORACIC SPINE FINDINGS Alignment:  Normal Vertebrae: Posterior instrumented spinal fusion beginning at T11. Vertebrae are otherwise normal. Cord:  Normal Paraspinal and other soft tissues: Bilateral pleural effusions Disc levels: No spinal canal or neural foraminal stenosis. MRI LUMBAR SPINE FINDINGS Segmentation:  Standard. Alignment:  Physiologic. Vertebrae: Susceptibility artifact from spinal hardware obscures the T12-L2 levels and to a lesser extent the T11 and L3 levels. No visible bone marrow abnormality. Conus medullaris and cauda equina: Conus extends to the L1 level. Conus and cauda equina appear normal. Paraspinal and other soft tissues: There is edema within the posterior soft tissues. No abscess. Disc levels: Assessment is limited by susceptibility effects and mild motion. L1-2: No spinal canal stenosis. Neural foramina are obscured. L2-3: Widely patent spinal. No neural foraminal stenosis. L3-4: No spinal canal stenosis. Mild left foraminal stenosis due to asymmetric disc bulge. L4-5: Right asymmetric disc bulge with narrowing of the right lateral recess. No neural foraminal stenosis. L5-S1: No spinal canal or neural foraminal stenosis. IMPRESSION: 1. Posterior instrumented spinal fusion at T11-L3 with corpectomy at L1. Associated susceptibility artifact that obscures the T12-L2 level and to a lesser extent T11-12 levels. Within that limitation, no visible evidence of discitis-osteomyelitis. 2. No thoracic or lumbar spinal canal stenosis. 3. Nonspecific edema within the posterior soft tissues. No abscess. 4. Bilateral pleural effusions. Electronically Signed   By: Ulyses Jarred M.D.   On: 02/12/2020 21:08   MR THORACIC SPINE W WO CONTRAST  Result Date: 01/20/2020 CLINICAL DATA:  Back pain. Abdominal pain. CT scan demonstrates destruction  of the L1 vertebral body. History of benign-appearing compression fracture in January 21. EXAM: MRI THORACIC WITHOUT AND WITH CONTRAST TECHNIQUE: Multiplanar and multiecho pulse sequences of the thoracic spine were obtained without and with intravenous contrast. CONTRAST:  22m GADAVIST GADOBUTROL 1 MMOL/ML IV SOLN COMPARISON:  MRI of the lumbar spine dated 01/20/2020 and CT scans dated 01/20/2020 and 11/11/2019 FINDINGS: MRI THORACIC SPINE FINDINGS Alignment:  Physiologic. Vertebrae: Severe compression fracture of L1 with abnormal signal from the T12 vertebral body with erosion/destruction of a portion of the inferior endplate of TI62 Edema and enhancement around the facet joints at T12-L1. The remainder of  the thoracic spine appears normal. Cord: The tip of the conus is at T12-L1. There is protrusion of bone and soft tissue into the spinal canal at T12-L1 with moderately severe compression of the thecal sac. Paraspinal and other soft tissues: Paraspinal phlegmon extending into the psoas muscles on the right and left with abnormal edema and enhancement of those soft tissues as well as extending into the anterior epidural space at T12-L1 and L1-2. Disc levels: The discs from C7-T1 through T11-12 are normal. T12-L1: There is no disc bulging or protrusion. There is slight edema in the expanded disc space but there is no abnormal enhancement of the disc after contrast administration. There is erosion/destruction of a small segment of the inferior endplate of G86 with destruction of the L1 vertebral body with protrusion of bone and soft tissue into the anterior epidural space as described above. Paraspinal phlegmon. IMPRESSION: 1. Severe compression fracture of T12 with protrusion of bone and soft tissue into the spinal canal with moderately severe compression of the thecal sac. I suspect this represents an atypical infection such as tuberculosis or actinomycosis. B-cell lymphoma should be considered. The patient is  afebrile and has a normal white blood count at this time. 2. Paraspinal phlegmon at T12-L1 and L1-2. Electronically Signed   By: Lorriane Shire M.D.   On: 01/20/2020 17:50   MR Lumbar Spine W Wo Contrast  Result Date: 01/20/2020 CLINICAL DATA:  Progressive compression fracture of L1. CT scan of the abdomen dated 01/20/2020 suggests the possibility of discitis/osteomyelitis. EXAM: MRI LUMBAR SPINE WITHOUT AND WITH CONTRAST TECHNIQUE: Multiplanar and multiecho pulse sequences of the lumbar spine were obtained without and with intravenous contrast. CONTRAST:  76m GADAVIST GADOBUTROL 1 MMOL/ML IV SOLN COMPARISON:  CT scan dated 01/20/2020 and 11/15/2019 FINDINGS: Segmentation:  Standard. Alignment: Severe compression fracture of L1 with protrusion of the posterior margin of the into the spinal canal approximately 6 mm. Vertebrae: There is a severe compression fracture of L1 with fragmentation of the vertebral body. There are new erosions of the inferior endplate of TP61and of the superior endplate of L2 with abnormal enhancement of those vertebra as well as abnormal enhancement of the remnants of the L1 vertebral body. There is abnormal soft tissue surrounding the vertebral bodies from T12 through L2 with abnormal enhancement after contrast administration. The process involves the adjacent psoas muscles. There is also protrusion of bone and soft tissue into the spinal canal. There is enhancement of the protruding bone and soft tissue with moderately severe compression of the thecal sac. There is no significant abnormality of the discs at T12-L1 and L1-2. There is slight enhancement around the facet joints at T12-L1 and L1-2. Conus medullaris: Conus extends to the T12-L1 level. Paraspinal and other soft tissues: There is abnormal edema and enhancement of the psoas muscles from T12 through L2-3 bilaterally. No discrete paraspinal abscess. There are some bone fragments in the paraspinal soft tissues within the right  psoas muscle at T12-L1 level. Disc levels: T11-12: Normal. T12-L1: No disc bulging or disc protrusion. Enhancing epidural soft tissue extends into the spinal canal and compresses the thecal sac without a definable epidural abscess. Destruction of a portion of the inferior endplate of TP50 Posterior margin of the fractured and destroyed L1 vertebral body protrudes 6 mm into the spinal canal. L1-2: The disc appears normal. Abnormal enhancement of the L1 vertebral body fragments and of the superior endplate of L2 destruction of a portion of the superior endplate. Abnormal enhancing soft  tissue is seen anterior to the thecal sac at L1-2 with slight symmetrical compression of the thecal sac at that level. L2-3: Tiny disc bulges into the neural foramina without neural impingement. Otherwise negative. L3-4: Small broad-based disc bulge with symmetrical slight compression of the thecal sac. The bulges extend into both neural foramina without focal neural impingement. L4-5: Small broad-based disc bulge asymmetric into the right neural foramen, touching right L4 nerve within the neural foramen. L5-S1: Small central disc bulge without neural impingement. Otherwise negative. IMPRESSION: Destruction of the L1 vertebral body with partial destruction of the inferior endplates of K53 and L2 with abnormal enhancing paraspinal and epidural soft tissues at that level. No definable abscess. I suspect this represents atypical infection such as TB or actinomycosis. B-cell lymphoma can also give this appearance. There is moderate compression of the thecal sac at L1 as described above. The patient has a history of osteomyelitis and MRSA. However, the patient's white blood count and temperature are normal at this time. Electronically Signed   By: Lorriane Shire M.D.   On: 01/20/2020 17:44   US RENAL  Result Date: 02/11/2020 CLINICAL DATA:  Urinary retention EXAM: RENAL / URINARY TRACT ULTRASOUND COMPLETE COMPARISON:  CT 02/11/2020,  ultrasound 04/16/2019 FINDINGS: Right Kidney: Renal measurements: 10.6 x 5.1 x 4.8 cm = volume: 135 mL . Echogenicity within normal limits. No mass or hydronephrosis visualized. Left Kidney: Renal measurements: 11.0 x 4.8 x 4.8 cm = volume: 132 mL. Echogenicity within normal limits. No mass or hydronephrosis visualized. Bladder: Moderately distended, but appears otherwise unremarkable. Other: Heterogeneous appearance of the visualized portion of the liver, compatible with known cirrhosis. IMPRESSION: 1. Moderately distended urinary bladder. 2. Unremarkable kidneys without hydronephrosis. 3. Partially visualized cirrhotic liver. Electronically Signed   By: Davina Poke D.O.   On: 02/11/2020 14:20   DG Chest Port 1 View  Result Date: 02/14/2020 CLINICAL DATA:  Dyspnea. EXAM: PORTABLE CHEST 1 VIEW COMPARISON:  01/22/2020 FINDINGS: Interval enlarged cardiac silhouette, mild increase in prominence of the pulmonary vasculature and minimal increase in prominence of the interstitial markings. Interval small amount of linear density at the left lung base. Minimal fluid in the minor fissure. Thoracic spine degenerative changes and thoracolumbar spine fixation hardware. Old, healed left clavicle fracture. IMPRESSION: 1. Interval cardiomegaly and minimal changes of congestive heart failure. 2. Interval small amount of linear atelectasis at the left lung base. Electronically Signed   By: Claudie Revering M.D.   On: 02/14/2020 16:30   DG Chest Port 1 View  Result Date: 01/22/2020 CLINICAL DATA:  64 year old female line placement. Status post thoracolumbar surgery, suspected spinal infection. EXAM: PORTABLE CHEST 1 VIEW COMPARISON:  Chest radiographs 11/12/2019 and earlier. FINDINGS: Portable AP semi upright view at 2040 hours. Right IJ approach central line placed. Tip at the cavoatrial junction. Mediastinal contours remain normal. Low lung volumes. No pneumothorax. Allowing for portable technique the lungs are clear. New  lower thoracic and upper lumbar spinal hardware partially visible. Stable cholecystectomy clips. Paucity of bowel gas. Chronic osseous deformities about the left shoulder. IMPRESSION: 1. Right IJ central line placed, tip at the cavoatrial junction. No acute cardiopulmonary abnormality. 2. Partially visible new thoracolumbar spine hardware. Electronically Signed   By: Genevie Ann M.D.   On: 01/22/2020 21:01   DG C-Arm 1-60 Min  Result Date: 01/22/2020 CLINICAL DATA:  Lumbar spine surgery. EXAM: LUMBAR SPINE - 2-3 VIEW; DG C-ARM 1-60 MIN COMPARISON:  CT 11/15/2019 and MRI 01/20/2020 FINDINGS: Eleven spot intraoperative images of  the lumbar spine demonstrate placement of a spacer over the previous site of patient's known L1 compression fracture with associated posterior fusion hardware from T11 to at least L3 but possibly extending below the L3 level. Hardware is intact. There is normal alignment of the visualized thoracolumbar spine. IMPRESSION: Intraoperative findings as described with posterior fusion hardware intact from T11 to at least the L3 level bridging a spacer device at the L1 level. Recommend correlation with findings at the time of the procedure. Electronically Signed   By: Marin Olp M.D.   On: 01/22/2020 20:27   VAS [name] LOWER EXTREMITY VENOUS (DVT)  Result Date: 02/01/2020  Lower Venous DVTStudy Indications: Swelling.  Comparison Study: No prior exam. Performing Technologist: Baldwin Crown ARDMS, RVT  Examination Guidelines: A complete evaluation includes B-mode imaging, spectral Doppler, color Doppler, and power Doppler as needed of all accessible portions of each vessel. Bilateral testing is considered an integral part of a complete examination. Limited examinations for reoccurring indications may be performed as noted. The reflux portion of the exam is performed with the patient in reverse Trendelenburg.  +--------+---------------+---------+-----------+----------+--------------------+ RIGHT    CompressibilityPhasicitySpontaneityPropertiesThrombus Aging       +--------+---------------+---------+-----------+----------+--------------------+ CFV     Full           Yes      Yes                                       +--------+---------------+---------+-----------+----------+--------------------+ SFJ     Full                                                              +--------+---------------+---------+-----------+----------+--------------------+ FV Prox Full                                                              +--------+---------------+---------+-----------+----------+--------------------+ FV Mid  Full                                                              +--------+---------------+---------+-----------+----------+--------------------+ FV      Full                                                              Distal                                                                    +--------+---------------+---------+-----------+----------+--------------------+  PFV     Full                                                              +--------+---------------+---------+-----------+----------+--------------------+ POP     Full           Yes      Yes                                       +--------+---------------+---------+-----------+----------+--------------------+ PTV     None                                         no blood flow                                                             visualized           +--------+---------------+---------+-----------+----------+--------------------+ PERO    None                                         no blood flow                                                             visualized           +--------+---------------+---------+-----------+----------+--------------------+   +--------+---------------+---------+-----------+----------+--------------------+ LEFT     CompressibilityPhasicitySpontaneityPropertiesThrombus Aging       +--------+---------------+---------+-----------+----------+--------------------+ CFV     Full           Yes      Yes                                       +--------+---------------+---------+-----------+----------+--------------------+ SFJ     Full                                                              +--------+---------------+---------+-----------+----------+--------------------+ FV Prox Full                                                              +--------+---------------+---------+-----------+----------+--------------------+ FV Mid  Full                                                              +--------+---------------+---------+-----------+----------+--------------------+  FV      Full                                                              Distal                                                                    +--------+---------------+---------+-----------+----------+--------------------+ PFV     Full                                                              +--------+---------------+---------+-----------+----------+--------------------+ POP     Full           Yes      Yes                                       +--------+---------------+---------+-----------+----------+--------------------+ PTV                                                  no blood flow                                                             visualized           +--------+---------------+---------+-----------+----------+--------------------+ PERO                                                 no blood flow                                                             visualized           +--------+---------------+---------+-----------+----------+--------------------+     Summary: RIGHT: - Findings consistent with acute deep vein thrombosis involving the right  peroneal veins, and right posterior tibial veins. - No cystic structure found in the popliteal fossa.  LEFT: - Findings consistent with acute deep vein thrombosis involving the left peroneal veins, and left posterior tibial veins. - No cystic structure found in the popliteal fossa.  *See table(s) above for measurements and observations. Electronically signed by Curt Jews MD on 02/01/2020 at 8:19:23 AM.  Final    [name] EKG SITE RITE  Result Date: 01/25/2020 If Site Rite image not attached, placement could not be confirmed due to current cardiac rhythm.   Labs:  Basic Metabolic Panel: Recent Labs  Lab 02/12/20 0458 02/13/20 0417 02/14/20 0416 02/15/20 0353 02/16/20 0416 02/17/20 0500 02/18/20 0411  NA 137 136 133* 132* 134* 135 136  K 4.4 4.7 4.8 5.0 4.5 5.0 5.2*  CL 107 107 105 105 103 104 103  CO2 22 21* 21* 20* 20* 20* 22  GLUCOSE 109* 135* 115* 73 63* 81 125*  BUN 37* 40* 47* 54* 59* 64* 69*  CREATININE 2.95* 3.36* 4.29* 4.94* 5.47* 5.95* 6.35*  CALCIUM 8.7* 8.7* 8.6* 8.6* 8.7* 8.3* 8.6*  MG  --   --   --  2.4  --   --  2.4  PHOS  --   --   --   --   --   --  3.7    CBC: Recent Labs  Lab 02/16/20 0416 02/17/20 0500 02/18/20 0411  WBC 6.3 6.2 7.4  NEUTROABS 3.7 3.6 4.2  HGB 8.6* 8.1* 8.7*  HCT 27.9* 26.6* 28.3*  MCV 88.6 89.3 89.6  PLT 124* 123* 131*    CBG: Recent Labs  Lab 02/17/20 1034 02/17/20 1130 02/17/20 1651 02/17/20 2132 02/18/20 0620  GLUCAP 99 143* 148* 154* 127*    Brief HPI:   [name] [name] is a 64 y.o. right-handed adult with history of anxiety, liver cirrhosis, PTSD, legally blind, diabetes mellitus, CKD stage III, chronic anemia, right partial foot amputation 2014 as well as multiple I&D's of the right left foot.  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  She lives with her spouse.  She is able to stand pivot transfers.  She has a home health aide 3 hours  a day.  Presented 01/20/2020 with abdominal pain constipation increasing back pain over 3 weeks.  CT abdomen pelvis showed interval development of L1 vertebral plana with marked bony resorption of the L1 vertebral body as well as erosive changes of the inferior T12 endplate and superior L2 endplate.  Overall rapid appearance and rapid progression concerning for underlying discitis osteomyelitis.  No evidence of paraspinal abscess.  CT MRI thoracic lumbar spine showed erosive process destruction of much of the L1 vertebral body some involvement of adjacent endplates of D32 and L2.  There was involvement of bilateral psoas muscles with myositis.  There was some mild retropulsion resulting in spinal stenosis but no severe neural element impingement.  Admission chemistries creatinine 1.55 hemoglobin 8.8 surgical PCR screen positive.  Patient underwent L1 corpectomy left lateral retroperitoneal and retropleural approach reconstruction with expandable titanium cage percutaneous segmental instrumentation with pedicle screw fixation 01/22/2020 per Dr. Duffy Rhody.  TLSO back brace.  Pseudomonas growing in tissue cultures recommendations were for cefepime through 03/05/2020.  Subcutaneous Lovenox for DVT prophylaxis.  Hospital course complicated by anemia 7.2.  Patient was admitted for a comprehensive rehab program   Hospital Course: [name] [name] was admitted to rehab 01/29/2020 for inpatient therapies to consist of PT, ST and OT at least three hours five days a week. Past admission physiatrist, therapy team and rehab RN have worked together to provide customized collaborative inpatient rehab.  Patient with long complicated hospital course in regards to osteomyelitis of vertebral and thoracolumbar region she had undergone L1 corpectomy with pedicle screw fixation 01/22/2020 back brace as advised.  Initially maintained on cefepime for Pseudomonas 2 03/05/2020.  Cefepime changed to meropenem 02/14/2020 due to elevated  creatinine felt to be possibly secondary to antibiotic therapy.  Pain management as directed close monitoring for any increased sedation.  Hospital course bilateral posterior tibial and peroneal DVTs patient was cleared to begin Xarelto however due to some ongoing bouts of anemia she was changed to heparin.  Renal service consulted for CKD stage III elevated creatinine to 6.35 no current plan for hemodialysis.  She continued to receive IV diuresis.  Bouts of acute urinary retention a Foley catheter tube was placed she was on low-dose bethanechol.  MRI of thoracic lumbar spine was completed showing no acute findings.  Gastroenterology service was consulted for chronic anemia awaiting plan for EGD patient had declined colonoscopy.  There was concern if she could not tolerate heparin therapy after being transitioned from Xarelto she perhaps may need an IVC filter.   Blood pressures were monitored on TID basis and soft and monitored  Diabetes has been monitored with ac/hs CBG checks and SSI was use prn for tighter BS control.    [name] [name] will continue to receive follow up therapies   after discharge  Rehab course: During patient's stay in rehab weekly team conferences were held to monitor patient's progress, set goals and discuss barriers to discharge. At admission, patient required moderate assist side-lying to sitting mod assist supine to sit minimal assist ambulate pivot steps.  He/She  has had improvement in activity tolerance, balance, postural control as well as ability to compensate for deficits. He/She has had improvement in functional use RUE/LUE  and RLE/LLE as well as improvement in awareness.  Patient was attending therapies with minimal gains due to multiple medical issues.  She performed bed mobility sit to supine contact-guard assist patient performed sit to stand x3 contact-guard assist to minimal assist with fatigue factors.  With ongoing medical issues and inability to participate fully with  therapies patient was discharged to acute care services to acknowledge all medical issues.       Disposition: Discharge to acute care services    Diet: Currently n.p.o.  Special Instructions: All medication changes made with follow-up attendings per [facility] hospitalist  Discharge Instructions    Home infusion instructions   Complete by: As directed    Instructions: Flushing of vascular access device: 0.9% NaCl pre/post medication administration and prn patency; Heparin 100 u/ml, 23m for implanted ports and Heparin 10u/ml, 536mfor all other central venous catheters.      Follow-up Information    [place], MeJinny BlossomMD Follow up.   Specialty: Physical Medicine and Rehabilitation Why: office to call for appointment Contact information: [phone]        DuNewt MinionMD Follow up.   Specialty: Orthopedic Surgery Why: As needed Contact information: [phone]        ThVallarie MareMD Follow up.   Specialty: Neurosurgery Why: Call for appointment Contact information: [phone]        CaMichel BickersMD Follow up.   Specialty: Infectious Diseases Why: Call for appointment Contact information: [address] [phone]           Signed: DaLavon Paganiningiulli 02/18/2020, 9:00 AM

## 2020-02-18 NOTE — Progress Notes (Signed)
Occupational Therapy Note  Patient Details  Name: [place] Throckmorton MRN: [id number] Date of Birth: 1956/04/18  Today's Date: 02/18/2020 OT Missed Time: 67 Minutes Missed Time Reason: Other (comment)  Pt with change in medical status and not seen for scheduled therapy secondary to awaiting acute bed.   Darleen Crocker P 02/18/2020, 2:18 PM

## 2020-02-18 NOTE — Progress Notes (Signed)
[facility] Progress Note   Subjective:   Discussed patient's care briefly with Courtney Heys MD of the Physical Medicine and Rehab team. Will be transferring patient back to medicine service due to poor progress and decreased ability to participate in therapy. The patient notes that 7 AM is too early for her to get going as she worked night shift for many years. Patient is on clears this morning in preparation for her EGD this afternoon. Change in mental status yesterday morning likely due to episode of hypoglycemia. She endorses fatigue but denies anorexia, nausea, vomiting, muscle cramps, or pruritus.   Objective: Vitals:   02/17/20 1421 02/17/20 2020 02/18/20 0422 02/18/20 0600  BP: 131/69 (!) 138/58 101/67   Pulse: 79 85 78   Resp: 18 20 16    Temp: 98.1 F (36.7 C) 97.7 F (36.5 C) 98.5 F (36.9 C)   TempSrc:      SpO2: 97% 97% 100%   Weight:    86.5 kg   Physical Exam: General: Ill-appearing though pleasant female lying in hospital bed. Foley catheter in place. NAD. A&Ox3 though poor insight.  Heart: RRR, S1/S2 distinct. No rubs.  Lungs: CTA BL. Normal respiratory effort without use of accessory mcucles.  Abdomen: BS +. Soft, mildly distended though nontender.  Extremities: Trace edema of BL LE. Asterixis present.  Filed Weights   02/16/20 0507 02/17/20 0500 02/18/20 0600  Weight: 98 kg 93.1 kg 86.5 kg    Intake/Output Summary (Last 24 hours) at 02/18/2020 1133 Last data filed at 02/18/2020 0854 Gross per 24 hour  Intake 300 ml  Output 2800 ml  Net -2500 ml    Additional Objective Labs: Basic Metabolic Panel: Recent Labs  Lab 02/16/20 0416 02/17/20 0500 02/18/20 0411  NA 134* 135 136  K 4.5 5.0 5.2*  CL 103 104 103  CO2 20* 20* 22  GLUCOSE 63* 81 125*  BUN 59* 64* 69*  CREATININE 5.47* 5.95* 6.35*  CALCIUM 8.7* 8.3* 8.6*  PHOS  --   --  3.7   Liver Function Tests: Recent Labs  Lab 02/12/20 0458 02/18/20 0411  AST 17  --   ALT 12  --    ALKPHOS 101  --   BILITOT 0.6  --   PROT 6.6  --   ALBUMIN 1.8* 1.8*   No results for input(s): LIPASE, AMYLASE in the last 168 hours. CBC: Recent Labs  Lab 02/14/20 0416 02/14/20 0416 02/15/20 0353 02/15/20 0353 02/16/20 0416 02/17/20 0500 02/18/20 0411  WBC 6.5   < > 6.7   < > 6.3 6.2 7.4  NEUTROABS 4.0   < > 4.1   < > 3.7 3.6 4.2  HGB 7.1*   < > 8.5*   < > 8.6* 8.1* 8.7*  HCT 24.5*   < > 27.6*   < > 27.9* 26.6* 28.3*  MCV 90.4  --  88.5  --  88.6 89.3 89.6  PLT 122*   < > 121*   < > 124* 123* 131*   < > = values in this interval not displayed.   Blood Culture    Component Value Date/Time   SDES TISSUE 01/22/2020 1706   SPECREQUEST 2 LUMBAR 1 01/22/2020 1706   CULT  01/22/2020 1706    RARE PSEUDOMONAS AERUGINOSA CRITICAL RESULT CALLED TO, READ BACK BY AND VERIFIED WITH: RN J [id number] FCP NO ANAEROBES ISOLATED Performed at [facility], [place] [address] [place], [place] [address]    REPTSTATUS 01/27/2020  FINAL 01/22/2020 1706    Cardiac Enzymes: No results for input(s): CKTOTAL, CKMB, CKMBINDEX, TROPONINI in the last 168 hours. CBG: Recent Labs  Lab 02/17/20 1034 02/17/20 1130 02/17/20 1651 02/17/20 2132 02/18/20 0620  GLUCAP 99 143* 148* 154* 127*   Iron Studies: No results for input(s): IRON, TIBC, TRANSFERRIN, FERRITIN in the last 72 hours. Lab Results  Component Value Date   INR 1.2 01/22/2020   INR 1.1 04/19/2019   INR 1.16 04/09/2017   Studies/Results: No results found.  Medications: . sodium chloride 250 mL (02/14/20 1829)  . meropenem (MERREM) IV     . atorvastatin  10 mg Oral Daily  . bethanechol  10 mg Oral TID  . Chlorhexidine Gluconate Cloth  6 each Topical Daily  . feeding supplement (GLUCERNA SHAKE)  237 mL Oral TID BM  . furosemide  80 mg Intravenous BID  . insulin aspart  0-5 Units Subcutaneous QHS  . insulin aspart  0-9 Units Subcutaneous TID WC  . lidocaine  1 patch Transdermal Q24H  . multivitamin with  minerals  1 tablet Oral Daily  . pantoprazole  40 mg Oral Q0600  . pregabalin  75 mg Oral QHS  . psyllium  1 packet Oral Daily  . senna  1 tablet Oral Daily  . sodium chloride flush  10-40 mL Intracatheter Q12H  . traZODone  50 mg Oral QHS    Assessment/Plan: Ms. Tavenner is a 64 y.o female and patient of [place] with CKD stage III due to uncontrolled DM with proteinuria with an extensive PMH including HTN and septic arthritis of the R elbow. 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 Discharged to rehab here4/1. Noted steady rise of Cr over the past week from 1.6 to 2.95 along with urinary retention. CT A/P as well as renal US ruled out obstruction 02/11/20. Nephrology consulted. BaselineSCr noted at 1.35-1.99.Required Foley placement 02/11/20. Cefepime DC'd, added meropenum 4/18. UOP improving. Monitoring persistently elevated SCr. Supportive care.Patient transitioning to more acute care 02/18/20.   1.ProgressiveAKI on CKD stage III - Nonoliguric and subnephrotic (UA 4/16 with 30 protein). FeNa 2.2. C3/C4 normal. - Cr worsened from 5.95 to 6.35 today but good UOP (2.25 L). K up to 5.2.  - No apparent reversible issues. Suspect AIN due to cefepime but no improvement with switch to meropenum 4/18.  - Not overtly uremic on exam. Dialysis is not indicated at this time.   PLAN: - Increase BMP monitoring to twice daily. If K continues to rise, will administer Lokelma.  - Continue supportive care, furosemide 80 mg IV BID.  - Currently day 5 since cessation of likely offending agent. If no improvement by the end of the week, will discuss renal biopsy for more definitive diagnosis and treatment.  - We will follow closely and reassess with move to more acute floor. - Maintain foley until Cr plateaus.  - Continue daily weights and strict I/Os. - Avoid nephrotoxic agents including NSAIDs and IV contrast.  2.  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 Transitioned to cefepime 3/27-4/16. Recommended d/c considering potential acute interstitial nephritis as cause of kidney failure.  - Meropenum 1 g IV initiated 4/18. Planning to continue through 03/05/20.  4.Anemiaof CKD - Baseline Hgb of 10. Stable at 8.7 today. 4/15/21Delene Loll, [id number], [place].Friday. -Required pRBC x 3 this admission.  - After discussion with chaplain and GI team, patient elected to proceed  with EGD this afternoon.  5. 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 - Per primary team.   6.LongstandingDMT2with microvascular disease - Most recent HbA1c 12.5%.  - With poor kidney function, patient requiring less insulin. BG dropped to 60 yesterday morning and patient confused. 127 this morning.  - Primary team managing. Currently holding lantus. Novolog sliding scale. Monitoring BG QID.  7. Recent lower extremity DVT - On heparin. Continue per primary team.   Nephrology will continue to follow the care of this patient.    Marisa Sprinkles, PA-S2 [facility]

## 2020-02-18 NOTE — Progress Notes (Signed)
Pharmacy Antibiotic Note  Azora Reida Hem is a 64 y.o. 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 Pharmacy has been consulted for Meropenem dosing. 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   Patients renal function declining very rapidly over the past few days. Scr 1.93 (on 4/13) up to 6.35 this morning - nephrology following. Current CrCl at 8.9 ml/min. WBC WNL, afebrile. Will monitor very closely for renal function.   Plan: Stop Cefepime Change to Meropenem 1 gram IV every 24 hours (to continue through 5/7) Follow-up renal function / clinical improvement to adjust meropenem administration frequency with changes in renal function. Update OPAT orders with updated meropenem dosing once renal function has stabilized.   Weight: 86.5 kg (190 lb 9.6 oz)  Temp (24hrs), Avg:98.1 F (36.7 C), Min:97.7 F (36.5 C), Max:98.5 F (36.9 C)  Recent Labs  Lab 02/14/20 0416 02/15/20 0353 02/16/20 0416 02/17/20 0500 02/18/20 0411  WBC 6.5 6.7 6.3 6.2 7.4  CREATININE 4.29* 4.94* 5.47* 5.95* 6.35*    Estimated Creatinine Clearance (by C-G formula based on SCr of 6.35 mg/dL (H)) Female: 8.9 mL/min (A) Female: 11 mL/min (A)    Allergies  Allergen Reactions  . Bee Pollen Anaphylaxis  . Fish-Derived Products Hives, Shortness Of Breath, Swelling and Rash    Hives get in throat causing trouble breathing  . Mushroom Extract Complex Anaphylaxis  . Penicillins Anaphylaxis    **Tolerated cefepime March 2021 Did it involve swelling of the face/tongue/throat, SOB, or low BP? Yes Did it involve sudden or severe rash/hives, skin peeling, or any reaction on the inside of your mouth or nose? No Did you need to seek medical attention at a hospital or doctor's office? Yes When did it last happen?A few months ago If all above answers are "NO", may proceed with cephalosporin use.  [name] Kitchen Rosemary Oil Anaphylaxis  . Shellfish Allergy  Hives, Shortness Of Breath, Swelling and Rash  . Tomato Hives and Shortness Of Breath    Hives in throat causes her trouble breathing  . Acetaminophen Other (See Comments)    GI upset  . Acyclovir And Related Other (See Comments)    Unknown rxn  . Aloe Vera Hives  . Broccoli [Brassica Oleracea] Hives  . Naproxen Other (See Comments)    Unknown rxn    Antimicrobials this admission: Levofloxacin 3/25 >> 3/26 Vancomycin 3/25 >> 3/27 Rifampin 3/26 >> 3/27 Cefepime 3/27 >> 4/17 Meropenem 4/17 >> (5/7)  Microbiology this admission: 3/23 BCx >> neg 3/23 COVID negative 3/24 MRSA PCR negative 3/25 Bone bx > rare Pseudomonas, no resistance, sensitive to all.   Thank you for the interesting consult and for involving pharmacy in this patient's care.  Antonietta Jewel, PharmD, [place] Clinical Pharmacist  Phone: [phone] 02/18/2020 7:45 AM  Please check AMION for all [place] phone numbers After 10:00 PM, call [place] [phone]

## 2020-02-18 NOTE — Progress Notes (Addendum)
ANTICOAGULATION CONSULT NOTE - Follow Up Consult  Pharmacy Consult for heparin Indication: DVT  Allergies  Allergen Reactions  . Bee Pollen Anaphylaxis  . Fish-Derived Products Hives, Shortness Of Breath, Swelling and Rash    Hives get in throat causing trouble breathing  . Mushroom Extract Complex Anaphylaxis  . Penicillins Anaphylaxis    **Tolerated cefepime March 2021 Did it involve swelling of the face/tongue/throat, SOB, or low BP? Yes Did it involve sudden or severe rash/hives, skin peeling, or any reaction on the inside of your mouth or nose? No Did you need to seek medical attention at a hospital or doctor's office? Yes When did it last happen?A few months ago If all above answers are "NO", may proceed with cephalosporin use.  [name] Kitchen Rosemary Oil Anaphylaxis  . Shellfish Allergy Hives, Shortness Of Breath, Swelling and Rash  . Tomato Hives and Shortness Of Breath    Hives in throat causes her trouble breathing  . Acetaminophen Other (See Comments)    GI upset  . Acyclovir And Related Other (See Comments)    Unknown rxn  . Aloe Vera Hives  . Broccoli [Brassica Oleracea] Hives  . Naproxen Other (See Comments)    Unknown rxn    Patient Measurements: Wt: 81.7 kg   Vital Signs: Temp: 98.5 F (36.9 C) (04/21 0422) BP: 101/67 (04/21 0422) Pulse Rate: 78 (04/21 0422)  Labs: Recent Labs    02/16/20 0416 02/16/20 0416 02/16/20 1229 02/17/20 0500 02/18/20 0411 02/18/20 0412  HGB 8.6*   < >  --  8.1* 8.7*  --   HCT 27.9*  --   --  26.6* 28.3*  --   PLT 124*  --   --  123* 131*  --   HEPARINUNFRC 0.29*   < > 0.43 0.36  --  0.34  CREATININE 5.47*  --   --  5.95* 6.35*  --    < > = values in this interval not displayed.    Estimated Creatinine Clearance (by C-G formula based on SCr of 6.35 mg/dL (H)) Female: 8.9 mL/min (A) Female: 11 mL/min (A)  Assessment: 62 YOF with new bilateral DVTs. Pharmacy consulted for IV heparin; Xarelto discontinued for worsening renal  function. Of note, patient fell recently but CT head was negative.   Heparin level is therapeutic at 0.34 on 1200 units/hr. No bleeding noted, Hgb low but stable at 8.7, platelets 131. No infusion issues or s/sx of bleeding.    Goal of Therapy:  Heparin level 0.3-0.7 units/ml Monitor platelets by anticoagulation protocol: Yes   Plan:  Continue heparin drip at 1200 units/hr - plan to stop 6 hr prior to GI procedure today Daily heparin level and CBC Monitor for s/sx of bleeding Will follow up after procedure for heparin restart timing  Thank you for involving pharmacy in this patient's care.  Antonietta Jewel, PharmD, [place] Clinical Pharmacist  Phone: [phone] 02/18/2020 7:34 AM  Please check AMION for all [place] phone numbers After 10:00 PM, call [place] [phone]

## 2020-02-18 NOTE — Interval H&P Note (Signed)
History and Physical Interval Note:  02/18/2020 4:11 PM  [name] [name]  has presented today for surgery, with the diagnosis of anemia, FOBT positve stool.  The various methods of treatment have been discussed with the patient and family. After consideration of risks, benefits and other options for treatment, the patient has consented to  Procedure(s): ESOPHAGOGASTRODUODENOSCOPY (EGD) WITH PROPOFOL (N/A) as a surgical intervention.  The patient's history has been reviewed, patient examined, no change in status, stable for surgery.  I have reviewed the patient's chart and labs.  Questions were answered to the patient's satisfaction.     Jackquline Denmark

## 2020-02-18 NOTE — Anesthesia Preprocedure Evaluation (Addendum)
Anesthesia Evaluation  Patient identified by MRN, date of birth, ID band Patient awake    Reviewed: Allergy & Precautions, NPO status , Patient's Chart, lab work & pertinent test results  History of Anesthesia Complications Negative for: history of anesthetic complications  Airway Mallampati: II  TM Distance: >3 FB Neck ROM: Full    Dental  (+) Dental Advisory Given, Edentulous Upper, Poor Dentition, Missing, Chipped   Pulmonary asthma , sleep apnea ,    Pulmonary exam normal breath sounds clear to auscultation       Cardiovascular hypertension, Pt. on medications (-) angina+ CAD and + Past MI  (-) Cardiac Stents Normal cardiovascular exam Rhythm:Regular Rate:Normal     Neuro/Psych PSYCHIATRIC DISORDERS Anxiety Depression  Neuromuscular disease    GI/Hepatic GERD  Medicated,(+) Cirrhosis       , FOBT positve stool   Endo/Other  diabetes, Poorly Controlled, Type 2, Insulin DependentObesity   Renal/GU Renal InsufficiencyRenal diseaseS/p kidney transplant      Musculoskeletal  (+) Arthritis , Fibromyalgia -  Abdominal   Peds  Hematology  (+) Blood dyscrasia (Thrombocytopenia), anemia ,   Anesthesia Other Findings Day of surgery medications reviewed with the patient.  Reproductive/Obstetrics                            Anesthesia Physical  Anesthesia Plan  ASA: IV  Anesthesia Plan: MAC   Post-op Pain Management:    Induction: Intravenous  PONV Risk Score and Plan: 3 and Propofol infusion and Treatment may vary due to age or medical condition  Airway Management Planned: Natural Airway and Nasal Cannula  Additional Equipment:   Intra-op Plan:   Post-operative Plan:   Informed Consent: I have reviewed the patients History and Physical, chart, labs and discussed the procedure including the risks, benefits and alternatives for the proposed anesthesia with the patient or  authorized representative who has indicated his/her understanding and acceptance.     Dental advisory given  Plan Discussed with: CRNA  Anesthesia Plan Comments:         Anesthesia Quick Evaluation

## 2020-02-18 NOTE — Progress Notes (Signed)
Physical Therapy Session Note  Patient Details  Name: [name] [name] MRN: [id number] Date of Birth: [birth year]  Today's Date: 02/18/2020 PT Individual Time: 3662-9476 PT Individual Time Calculation (min): 60 min   Short Term Goals: Week 3:  PT Short Term Goal 1 (Week 3): STG = LTG due to ELOS.  Skilled Therapeutic Interventions/Progress Updates:     Patient in bed upon PT arrival. Patient alert and agreeable to PT session. Patient reported 8/10 headache, rectum, and back pain during session, RN made aware and provided Tylenol during session. PT provided repositioning, rest breaks, and distraction as pain interventions throughout session. Patient requested to get to the [facility] to try to have a BM to relieve lower abdominal pressure and rectal pain. Reported that she had a suppository this morning. Discussed upcoming procedure and patient reported that she will be going to acute care after to manage her change in medical status. Patient became emotional about having to leave rehab, provided emotional support and encouragement to continue working on her mobility goals with acute therapy. Patient in better spirits after.  Vitals: BP 149/70, HR 82, SPO2 94%, RN made aware.  Therapeutic Activity: Bed Mobility: Patient performed rolling R/[name] and supine to/from sit with min A using bed rails. Provided verbal cues for log roll technique and total A for donning/doffing TLSO in supine. Transfers: Patient performed stand pivot  Bed<>BSC with min A-CGA using a RW. Provided verbal cues for sequencing hand placement on RW, and reaching back to sit. Patient was continent of bowl on [facility] with intermittent reports of pain during BM, RN made aware. Required total A for peri-care and doffing/donning incontinence brief during toileting. Educated patient on utilizing relaxation techniques as opposed to straining during toileting.  Patient in bed at end of session with breaks locked, bed alarm set, and all needs within  reach. Patient required increased time for all mobility due to pain and anxiety about upcoming procedure.   Therapy Documentation Precautions:  Precautions Precautions: Back, Fall, Other (comment)(has had issues w/orthostatic hypotension) Precaution Comments: Pt instructed in back precautions, and to self-monitor for activity tolerance, Required Braces or Orthoses: Spinal Brace Spinal Brace: Thoracolumbosacral orthotic, Applied in supine position Restrictions Weight Bearing Restrictions: No    Therapy/Group: Individual Therapy  [name] [name] Aniela Caniglia PT, DPT  02/18/2020, 12:45 PM

## 2020-02-18 NOTE — Progress Notes (Signed)
PROGRESS NOTE    [name] [name]  MLY:[id number] DOB: 02/17/1956 DOA: 01/29/2020 PCP: [name] Held, DO   No chief complaint on file.   Brief Narrative:  64 year old female with history of PTSD, HTN, fibromyalgia, DM-2, cirrhosis, CKD-3, recent hospitalization for L1 osteomyelitis/epidural abscess with Pseudomonas discharged to CIR with [name] [name] plan to complete IV cefepime through 5/7.  Hospitalist service consulted on 4/14 for AKI, thrombocytopenia, acute urinary retention and anemia.  AKI continued to get worse despite IV fluid and Foley catheter placement. Renal ultrasound without significant finding other than bladder distention.  She is not on any nephrotoxic agents and was on cefepime which could rarely cause AKI.  CT abdomen and pelvis without contrast with new mild T1 superior endplate compression fracture, mild lumbar paraspinal inflammation with no discrete postoperative hematoma and distended bladder but no acute finding.  MRI of thoracic and lumbar spine without acute finding though somewhat obscured by motion.  Nephrology consulted and initially started IV fluid.  Renal function continues to get worse.  Developed some respiratory distress with vascular congestion on CXR.  IV fluid discontinued.  Started on IV Lasix 80 mg twice daily.  Infectious disease consulted.  She was switched from cefepime to meropenem through 5/7. GI consulted for anemia with positive Hemoccult.  Plan for EGD on 4/21.  Patient declined colonoscopy.  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  Baseline Cr 1.3-1.5>> 1.93>> 4.29> 4.94> 5.47> 5.95> 6.38.  Unclear etiology but concern about possible interstitial nephritis from cefepime.  Also concern about obstructive etiology given acute urinary retention but no hydronephrosis on renal ultrasound. -Nephrology managing-on IV Lasix 80 mg twice daily.  2.3 L UOP/24 hrs.  Creatinine uptrending.  No uremia. -Cefepime discontinued.  Started on meropenem on 4/17 -Continue indwelling Foley catheter   Fluid overload: Likely due to renal failure and IV fluids.  Developed some respiratory distress and wheezing on 4/17.  BNP marginally elevated.  CXR with pulmonary congestion.  No history of CHF.  Echo in 2017 with EF of 55 to 60%.  Respiratory symptoms resolved.  Diuresed 2.2 L/ 24 hours.  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  Patient underwent anterolateral corpectomy with PSIF for osteomyelitison 01/22/2020 by neurosurgery and discharged to CIR on 4/1 on cefepime 5/7.  CRP and ESR elevated.  Imaging including CT [name] [name]/P, MRI T/L without contrast nonrevealing.  No leukocytosis or fever. -Now switched to meropenem in the setting of AKI.  Will continue antibiotic through 5/7. -Pain management per primary-I agree with reducing opiate dose  -Continue PT/OT  Acute urinary retention: Could be due to medications with anticholinergic side effects such as opiates, diazepam, Atarax... but difficult situation due to her uncontrolled pain.  MRI of thoracic and lumbar spine without acute significant finding. -Continue indwelling Foley catheter -Started low-dose bethanechol.  Normocytic anemia: Baseline Hgb 11-13 before previous  admission> 7.3 on discharge on 4/1> 6.7 on 4/5>1u> 8.1> 6.8 on 4/13>1u> 7.7> 8.1>>  7.1>1u>8.6> 8.1> 8.7.  Hemoccult positive.  Patient is on IV heparin for DVT.  Anemia panel and LDH within normal.  Very complex situation.  -Appreciate GI help-PPI, and EGD on 4/21 (results pending).  Patient declined colonoscopy. -Continue monitoring -IVC filter?  Thrombocytopenia: Dipped at 109 on 4/13>> 127> 121 > 131.  Due to liver cirrhosis?  Relatively stable. -Continue monitoring  Uncontrolled DM-2 with CKD-3A- most recent A1c 12.5%. -Reduce SSI to renal.  -Continue statin.    Recent bilateral lower extremity DVT-diagnosed on 4/2.  She has been on Xarelto. 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  She is nonimmune to hepatitis B  Chronic left heel ulcer: No signs of infection -Appreciate wound care  OSA? -Would benefit from outpatient sleep study  Anxiety/depression/insomnia-could be playing Kaysan Peixoto role into his uncontrolled pain -On Cymbalta, trazodone, Valium and Atarax.  DVT prophylaxis: heparin gtt Code Status: full Family Communication: husband at bedside Disposition:  Pending transfer to inpatient bed   Consultants:   [facility] taking over for CIR today  Nephrology  GI  ID  Procedures:   EGD 4/21 anterolateral corpectomy with PSIF for osteomyelitison 01/22/2020 by neurosurgery   Antimicrobials:  Anti-infectives (From admission, onward)   Start     Dose/Rate Route Frequency Ordered Stop   02/18/20 2100  meropenem (MERREM) 1 g in sodium chloride 0.9 % 100 mL IVPB     1 g 200 mL/hr over 30 Minutes Intravenous Every 24 hours 02/18/20 0738     02/18/20 0000  meropenem (MERREM) IVPB     1 g Intravenous Every 12 hours 02/18/20 0915 03/09/20 2359   02/18/20 0000  meropenem 1 g in sodium chloride 0.9 % 100 mL     1 g Intravenous Every 24 hours 02/18/20 0916     02/14/20 1530  meropenem (MERREM) 1 g in sodium chloride 0.9 % 100 mL IVPB  Status:  Discontinued     1 g 200 mL/hr over 30 Minutes  Intravenous Every 12 hours 02/14/20 1228 02/18/20 0738   02/13/20 0000  ceFEPime (MAXIPIME) IVPB  Status:  Discontinued     2 g Intravenous Every 24 hours 02/13/20 1438 02/14/20    02/12/20 0600  ceFEPIme (MAXIPIME) 2 g in sodium chloride 0.9 % 100 mL IVPB  Status:  Discontinued     2 g 200 mL/hr over 30 Minutes Intravenous Every 24 hours 02/11/20 1255 02/14/20 1127   02/10/20 0000  ceFEPime (MAXIPIME) IVPB  Status:  Discontinued     2 g Intravenous Every 12 hours 02/10/20 1450 02/13/20    01/29/20 2000  ceFEPIme (MAXIPIME) 2 g in sodium chloride 0.9 % 100 mL IVPB  Status:  Discontinued     2 g 200 mL/hr over 30 Minutes Intravenous Every 12 hours 01/29/20 1704 02/11/20 1255     Subjective: No new complaints  Objective: Vitals:   02/18/20 1639 02/18/20 1649 02/18/20 1700 02/18/20 1737  BP: (!) 109/55 115/61 140/64 (!) 153/81  Pulse: 64 63 68 74  Resp: 10 10 11 17   Temp: (!) 97.2 F (36.2 C)   97.8 F (36.6 C)  TempSrc: Temporal     SpO2: 98% 97% 94% 94%  Weight:        Intake/Output Summary (Last 24 hours) at 02/18/2020 2026 Last data filed at 02/18/2020 1300 Gross per 24 hour  Intake 60 ml  Output 3200 ml  Net -3140 ml   Filed Weights   02/16/20 0507 02/17/20 0500 02/18/20 0600  Weight: 98 kg 93.1 kg 86.5 kg    Examination:  General exam: Appears calm and comfortable  Respiratory system: Clear to auscultation. Respiratory effort normal. Cardiovascular system: S1 & S2 heard, RRR Gastrointestinal system: Abdomen is nondistended, soft and nontender.  Central nervous system: Alert and oriented. No focal neurological deficits. Extremities: moving all extremities Skin: No rashes, lesions or ulcers Psychiatry: Judgement and insight appear normal. Mood & affect appropriate.     Data Reviewed: I have personally reviewed following labs and imaging studies  CBC: Recent Labs  Lab 02/14/20 0416 02/15/20 0353 02/16/20 0416 02/17/20 0500 02/18/20 0411  WBC 6.5 6.7 6.3  6.2 7.4  NEUTROABS 4.0 4.1 3.7 3.6 4.2  HGB 7.1* 8.5* 8.6* 8.1* 8.7*  HCT 24.5* 27.6* 27.9* 26.6* 28.3*  MCV 90.4 88.5 88.6 89.3 89.6  PLT 122* 121* 124* 123* 131*    Basic Metabolic Panel: Recent Labs  Lab 02/15/20 0353 02/16/20 0416 02/17/20 0500 02/18/20 0411 02/18/20 1752  NA 132* 134* 135 136 139  K 5.0 4.5 5.0 5.2* 5.1  CL 105 103 104 103 106  CO2 20* 20* 20* 22 23  GLUCOSE 73 63* 81 125* 96  BUN 54* 59* 64* 69* 70*  CREATININE 4.94* 5.47* 5.95* 6.35* 6.38*  CALCIUM 8.6* 8.7* 8.3* 8.6* 8.7*  MG 2.4  --   --  2.4  --   PHOS  --   --   --  3.7  --     GFR: Estimated Creatinine Clearance (by C-G formula based on SCr of 6.38 mg/dL (H)) Female: 8.9 mL/min (Bastian Andreoli) Female: 10.9 mL/min (Etienne Millward)  Liver Function Tests: Recent Labs  Lab 02/12/20 0458 02/18/20 0411  AST 17  --   ALT 12  --   ALKPHOS 101  --   BILITOT 0.6  --   PROT 6.6  --   ALBUMIN 1.8* 1.8*    CBG: Recent Labs  Lab 02/17/20 2132 02/18/20 0620 02/18/20 1137 02/18/20 1442 02/18/20 1742  GLUCAP 154* 127* 96 92 92     No results found for this or any previous visit (from the past 240 hour(s)).       Radiology Studies: No results found.      Scheduled Meds: . atorvastatin  10 mg Oral Daily  . bethanechol  10 mg Oral TID  . Chlorhexidine Gluconate Cloth  6 each Topical Daily  . feeding supplement (GLUCERNA SHAKE)  237 mL Oral TID BM  . furosemide  80 mg Intravenous BID  . insulin aspart  0-5 Units Subcutaneous QHS  . insulin aspart  0-9 Units Subcutaneous TID WC  . lidocaine  1 patch Transdermal Q24H  . multivitamin with minerals  1 tablet Oral Daily  . pantoprazole  40 mg Oral Q0600  . pregabalin  75 mg Oral QHS  . psyllium  1 packet Oral Daily  . senna  1 tablet Oral Daily  . sodium chloride flush  10-40 mL Intracatheter Q12H  . traZODone  50 mg Oral QHS   Continuous Infusions: . sodium chloride 250 mL (02/14/20 1829)  . heparin    . meropenem (MERREM) IV       LOS: 20 days      Time spent: over 30 min    Fayrene Helper, MD [facility]   To contact the attending provider between 7A-7P or the covering provider during after hours 7P-7A,  please log into the web site [email or web address] and access using universal [place] password for that web site. If you do not have the password, please call the hospital operator.  02/18/2020, 8:26 PM

## 2020-02-18 NOTE — Anesthesia Postprocedure Evaluation (Signed)
Anesthesia Post Note  Patient: [name] [name]  Procedure(s) Performed: ESOPHAGOGASTRODUODENOSCOPY (EGD) WITH PROPOFOL (N/A ) BIOPSY     Patient location during evaluation: Endoscopy Anesthesia Type: MAC Level of consciousness: awake and alert Pain management: pain level controlled Vital Signs Assessment: post-procedure vital signs reviewed and stable Respiratory status: spontaneous breathing, nonlabored ventilation, respiratory function stable and patient connected to nasal cannula oxygen Cardiovascular status: stable and blood pressure returned to baseline Postop Assessment: no apparent nausea or vomiting Anesthetic complications: no    Last Vitals:  Vitals:   02/18/20 1514 02/18/20 1639  BP: (!) 161/70 (!) 109/55  Pulse: 75 64  Resp: 12 10  Temp: 36.7 C (!) 36.2 C  SpO2: 92% 98%    Last Pain:  Vitals:   02/18/20 1639  TempSrc: Temporal  PainSc:                  Catalina Gravel

## 2020-02-18 NOTE — Transfer of Care (Signed)
Immediate Anesthesia Transfer of Care Note  Patient: [name] [name]  Procedure(s) Performed: ESOPHAGOGASTRODUODENOSCOPY (EGD) WITH PROPOFOL (N/A ) BIOPSY  Patient Location: Endoscopy Unit  Anesthesia Type:MAC  Level of Consciousness: drowsy  Airway & Oxygen Therapy: Patient Spontanous Breathing  Post-op Assessment: Report given to RN and Post -op Vital signs reviewed and stable  Post vital signs: Reviewed and stable  Last Vitals:  Vitals Value Taken Time  BP 109/55 02/18/20 1639  Temp 36.2 C 02/18/20 1639  Pulse 65 02/18/20 1641  Resp 11 02/18/20 1641  SpO2 97 % 02/18/20 1641  Vitals shown include unvalidated device data.  Last Pain:  Vitals:   02/18/20 1639  TempSrc: Temporal  PainSc:       Patients Stated Pain Goal: 3 (20/94/70 9628)  Complications: No apparent anesthesia complications

## 2020-02-19 ENCOUNTER — Ambulatory Visit: Payer: PRIVATE HEALTH INSURANCE | Admitting: Internal Medicine

## 2020-02-19 ENCOUNTER — Other Ambulatory Visit: Payer: Self-pay | Admitting: Physician Assistant

## 2020-02-19 LAB — CBC WITH DIFFERENTIAL/PLATELET
Abs Immature Granulocytes: 0.3 10*3/uL — ABNORMAL HIGH (ref 0.00–0.07)
Basophils Absolute: 0.1 10*3/uL (ref 0.0–0.1)
Basophils Relative: 1 %
Eosinophils Absolute: 0.6 10*3/uL — ABNORMAL HIGH (ref 0.0–0.5)
Eosinophils Relative: 7 %
HCT: 29.3 % — ABNORMAL LOW (ref 36.0–46.0)
Hemoglobin: 9.1 g/dL — ABNORMAL LOW (ref 12.0–15.0)
Immature Granulocytes: 3 %
Lymphocytes Relative: 16 %
Lymphs Abs: 1.4 10*3/uL (ref 0.7–4.0)
MCH: 27.7 pg (ref 26.0–34.0)
MCHC: 31.1 g/dL (ref 30.0–36.0)
MCV: 89.3 fL (ref 80.0–100.0)
Monocytes Absolute: 0.9 10*3/uL (ref 0.1–1.0)
Monocytes Relative: 10 %
Neutro Abs: 5.5 10*3/uL (ref 1.7–7.7)
Neutrophils Relative %: 63 %
Platelets: 139 10*3/uL — ABNORMAL LOW (ref 150–400)
RBC: 3.28 MIL/uL — ABNORMAL LOW (ref 3.87–5.11)
RDW: 17 % — ABNORMAL HIGH (ref 11.5–15.5)
WBC: 8.7 10*3/uL (ref 4.0–10.5)
nRBC: 0 % (ref 0.0–0.2)

## 2020-02-19 LAB — GLUCOSE, CAPILLARY
Glucose-Capillary: 115 mg/dL — ABNORMAL HIGH (ref 70–99)
Glucose-Capillary: 153 mg/dL — ABNORMAL HIGH (ref 70–99)
Glucose-Capillary: 91 mg/dL (ref 70–99)
Glucose-Capillary: 119 mg/dL — ABNORMAL HIGH (ref 70–99)

## 2020-02-19 LAB — CBC
HCT: 28.7 % — ABNORMAL LOW (ref 36.0–46.0)
Hemoglobin: 9 g/dL — ABNORMAL LOW (ref 12.0–15.0)
MCH: 27.8 pg (ref 26.0–34.0)
MCHC: 31.4 g/dL (ref 30.0–36.0)
MCV: 88.6 fL (ref 80.0–100.0)
Platelets: 125 10*3/uL — ABNORMAL LOW (ref 150–400)
RBC: 3.24 MIL/uL — ABNORMAL LOW (ref 3.87–5.11)
RDW: 16.9 % — ABNORMAL HIGH (ref 11.5–15.5)
WBC: 7.8 10*3/uL (ref 4.0–10.5)
nRBC: 0 % (ref 0.0–0.2)

## 2020-02-19 LAB — COMPREHENSIVE METABOLIC PANEL
ALT: 9 U/L (ref 0–44)
AST: 17 U/L (ref 15–41)
Albumin: 1.9 g/dL — ABNORMAL LOW (ref 3.5–5.0)
Alkaline Phosphatase: 112 U/L (ref 38–126)
Anion gap: 11 (ref 5–15)
BUN: 72 mg/dL — ABNORMAL HIGH (ref 8–23)
CO2: 23 mmol/L (ref 22–32)
Calcium: 8.6 mg/dL — ABNORMAL LOW (ref 8.9–10.3)
Chloride: 105 mmol/L (ref 98–111)
Creatinine, Ser: 6.82 mg/dL — ABNORMAL HIGH (ref 0.44–1.00)
GFR calc Af Amer: 7 mL/min — ABNORMAL LOW (ref >60)
GFR calc non Af Amer: 6 mL/min — ABNORMAL LOW (ref >60)
Glucose, Bld: 106 mg/dL — ABNORMAL HIGH (ref 70–99)
Potassium: 5.2 mmol/L — ABNORMAL HIGH (ref 3.5–5.1)
Sodium: 139 mmol/L (ref 135–145)
Total Bilirubin: 0.2 mg/dL — ABNORMAL LOW (ref 0.3–1.2)
Total Protein: 7.2 g/dL (ref 6.5–8.1)

## 2020-02-19 LAB — PHOSPHORUS: Phosphorus: 4.5 mg/dL (ref 2.5–4.6)

## 2020-02-19 LAB — MAGNESIUM: Magnesium: 2.3 mg/dL (ref 1.7–2.4)

## 2020-02-19 LAB — HEPARIN LEVEL (UNFRACTIONATED)
Heparin Unfractionated: 0.25 IU/mL — ABNORMAL LOW (ref 0.30–0.70)
Heparin Unfractionated: 0.35 IU/mL (ref 0.30–0.70)

## 2020-02-19 NOTE — Progress Notes (Signed)
ANTICOAGULATION CONSULT NOTE - Follow Up Consult  Pharmacy Consult for Heparin Indication: DVT  Allergies  Allergen Reactions  . Bee Pollen Anaphylaxis  . Fish-Derived Products Hives, Shortness Of Breath, Swelling and Rash    Hives get in throat causing trouble breathing  . Mushroom Extract Complex Anaphylaxis  . Penicillins Anaphylaxis    **Tolerated cefepime March 2021 Did it involve swelling of the face/tongue/throat, SOB, or low BP? Yes Did it involve sudden or severe rash/hives, skin peeling, or any reaction on the inside of your mouth or nose? No Did you need to seek medical attention at a hospital or doctor's office? Yes When did it last happen?A few months ago If all above answers are "NO", may proceed with cephalosporin use.  [name] Kitchen Rosemary Oil Anaphylaxis  . Shellfish Allergy Hives, Shortness Of Breath, Swelling and Rash  . Tomato Hives and Shortness Of Breath    Hives in throat causes her trouble breathing  . Acetaminophen Other (See Comments)    GI upset  . Acyclovir And Related Other (See Comments)    Unknown rxn  . Aloe Vera Hives  . Broccoli [Brassica Oleracea] Hives  . Naproxen Other (See Comments)    Unknown rxn    Patient Measurements: Height: 5' 1"  (154.9 cm)(from 01/20/20 records) Weight: 86.5 kg (190 lb 11.2 oz) IBW/kg (Calculated) : 47.8 Heparin Dosing Weight: 68 kg  Vital Signs: Temp: 99.1 F (37.3 C) (04/22 1021) Temp Source: Oral (04/22 1021) BP: 129/57 (04/22 1021) Pulse Rate: 83 (04/22 1021)  Labs: Recent Labs    02/17/20 0500 02/18/20 0411 02/18/20 0411 02/18/20 0412 02/18/20 1752 02/19/20 0420 02/19/20 0933 02/19/20 1457  HGB  --  8.7*   < >  --   --  9.0* 9.1*  --   HCT  --  28.3*  --   --   --  28.7* 29.3*  --   PLT  --  131*  --   --   --  125* 139*  --   HEPARINUNFRC   < >  --   --  0.34  --  0.25*  --  0.35  CREATININE  --  6.35*  --   --  6.38*  --  6.82*  --    < > = values in this interval not displayed.    Estimated  Creatinine Clearance (by C-G formula based on SCr of 6.82 mg/dL (H)) Female: 8.3 mL/min (A) Female: 10.2 mL/min (A)   Assessment: Anticoag: lovenox 40>>Xarelto for new DVT. Messaged to possibly change to IV heparin for now due to anemia. Probably not HIT, 4T score 3. Transfused 4/17. Xarelto 38m BID started 4/2>heparin 4/14 - 4/2: LE dopplers - right/left acute DVT   -  Hgb 9. Plts 125 stable. -HL 0.35 on IV heparin rate of 1350 units/hr,  Heparin level is therapeutic.  No bleeding reported.   Goal of Therapy:  Heparin level 0.3-0.7 units/ml Monitor platelets by anticoagulation protocol: Yes   Plan:  Continue  IV heparin  1350 units/hr Daily HL and CBC    RNicole Cella RPh Clinical Staff Pharmacist [email or web address] 02/19/2020,3:58 PM

## 2020-02-19 NOTE — Plan of Care (Signed)
  Problem: Pain Managment: Goal: General experience of comfort will improve Outcome: Progressing   

## 2020-02-19 NOTE — Progress Notes (Signed)
[facility] Progress Note   Subjective:    [name] [name] was seen this morning in her new room in the [place] tower. She was unaccompanied and mildly confused this morning. Found attempting to get out of bed to use the restroom. Nursing called. Blood glucose was normal. She is typically found more somnolent in the mornings and perks up throughout the day. EGD performed with propofol yesterday. She is oriented only to person and place this morning. Noted the year was 2023. Currently receiving trazadone 50 mg at night for sleep and oxycodone 5 mg throughout the day/night q4hr prn. Patient's breakfast had arrived but she noted she didn't have an appetite to eat. Endorsed some nausea. She has no concerns other needing to have a bowel movement. Seems ready to have foley removed but reassured patient as why this must be kept in place for now.    Objective: Vitals:   02/18/20 2326 02/19/20 0206 02/19/20 0551  BP:  129/66 138/75  Pulse:  80 77  Resp:  16 15  Temp:  98.7 F (37.1 C) 98.4 F (36.9 C)  TempSrc:  Oral Oral  SpO2:  93% 98%  Weight: 86.5 kg     Physical Exam: General:Ill-appearing though pleasant female lying in hospital bed. Foley catheter in place. NAD.A&Ox2. Poor insight.  Heart:RRR, S1/S2 distinct. No rubs. Lungs:CTA BL. Normal respiratory effort without use of accessory mcucles. Abdomen:BS +. Soft, mildly distended though nontender. Extremities:Trace edema of BL LE. Some myoclonus present.  Filed Weights   02/18/20 2326  Weight: 86.5 kg    Intake/Output Summary (Last 24 hours) at 02/19/2020 1147 Last data filed at 02/19/2020 1058 Gross per 24 hour  Intake 299.38 ml  Output 2650 ml  Net -2350.62 ml    Additional Objective Labs: Basic Metabolic Panel: Recent Labs  Lab 02/18/20 0411 02/18/20 1752 02/19/20 0933  NA 136 139 139  K 5.2* 5.1 5.2*  CL 103 106 105  CO2 22 23 23   GLUCOSE 125* 96 106*  BUN 69* 70* 72*  CREATININE 6.35* 6.38* 6.82*   CALCIUM 8.6* 8.7* 8.6*  PHOS 3.7  --  4.5   Liver Function Tests: Recent Labs  Lab 02/18/20 0411 02/19/20 0933  AST  --  17  ALT  --  9  ALKPHOS  --  112  BILITOT  --  0.2*  PROT  --  7.2  ALBUMIN 1.8* 1.9*   No results for input(s): LIPASE, AMYLASE in the last 168 hours. CBC: Recent Labs  Lab 02/16/20 0416 02/16/20 0416 02/17/20 0500 02/17/20 0500 02/18/20 0411 02/19/20 0420 02/19/20 0933  WBC 6.3   < > 6.2   < > 7.4 7.8 8.7  NEUTROABS 3.7   < > 3.6  --  4.2  --  5.5  HGB 8.6*   < > 8.1*   < > 8.7* 9.0* 9.1*  HCT 27.9*   < > 26.6*   < > 28.3* 28.7* 29.3*  MCV 88.6  --  89.3  --  89.6 88.6 89.3  PLT 124*   < > 123*   < > 131* 125* 139*   < > = values in this interval not displayed.   Blood Culture    Component Value Date/Time   SDES TISSUE 01/22/2020 1706   SPECREQUEST 2 LUMBAR 1 01/22/2020 1706   CULT  01/22/2020 1706    RARE PSEUDOMONAS AERUGINOSA CRITICAL RESULT CALLED TO, READ BACK BY AND VERIFIED WITH: RN J [id number] FCP NO ANAEROBES ISOLATED Performed  at [facility], [place] [address] [place], [place] [address]    REPTSTATUS 01/27/2020 FINAL 01/22/2020 1706    Cardiac Enzymes: No results for input(s): CKTOTAL, CKMB, CKMBINDEX, TROPONINI in the last 168 hours. CBG: Recent Labs  Lab 02/18/20 1442 02/18/20 1742 02/18/20 2134 02/19/20 0702 02/19/20 1118  GLUCAP 92 92 166* 115* 153*   Iron Studies: No results for input(s): IRON, TIBC, TRANSFERRIN, FERRITIN in the last 72 hours. Lab Results  Component Value Date   INR 1.2 01/22/2020   INR 1.1 04/19/2019   INR 1.16 04/09/2017   Studies/Results: No results found.  Medications: . heparin 1,350 Units/hr (02/19/20 0804)  . meropenem (MERREM) IV 1 g (02/18/20 2337)   . atorvastatin  10 mg Oral Daily  . bethanechol  10 mg Oral TID  . Chlorhexidine Gluconate Cloth  6 each Topical Daily  . furosemide  80 mg Intravenous BID  . insulin aspart  0-9 Units Subcutaneous TID AC & HS  .  lidocaine  1 patch Transdermal Q24H  . multivitamin with minerals  1 tablet Oral Daily  . pantoprazole  40 mg Oral Daily  . pregabalin  75 mg Oral QHS  . psyllium  1 packet Oral Daily  . senna-docusate  1 tablet Oral Daily  . sodium chloride flush  10-40 mL Intracatheter Q12H  . traZODone  50 mg Oral QHS    Assessment/Plan: [name] [name] is a 64 y.o female and patient of [facility] with CKD stage III due to uncontrolled DM with proteinuria with an extensive PMH including HTN and septic arthritis of the R elbow. 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 Discharged to rehab here4/1. Noted steady rise of Cr over the past week from 1.6 to 2.95 along with urinary retention. CT A/P as well as renal US ruled out obstruction 02/11/20. Nephrology consulted. BaselineSCr noted at 1.35-1.99.Required Foley placement 02/11/20.Cefepime DC'd, added meropenum 4/18. UOP improving.Patient transitioned from rehab back to medical floor under hospitalist care 02/18/20. Monitoring persistently elevated SCr. Supportive care.  1.ProgressiveAKI on CKD stage III - Nonoliguric and subnephrotic (UA 4/16 with 30 protein).FeNa 2.2. C3/C4 normal. -No apparent reversible issues. Suspect AINdue to cefepimebut no improvement with switch tomeropenum 4/18.  - Cr worsened from 6.35 to 6.82today butgood UOP (1.8 L) on Lasix hopefully suggesting GFR recovery. K stable at 5.1. - Not overtly uremic on exam. Dialysis is not indicated at this time.   PLAN: - Continue twice daily BMP monitoring. If K begins to rise, will administer Lokelma.  - Continue supportive care, furosemide 80 mg IV BID.  - Currently day 6 since cessation of likely offending agent. If no improvement by the end of the week, will discuss renal biopsy for more definitive diagnosis and treatment vs. need for HD.  - Will monitor new onset nausea. Encourage oral intake with  supportive measures.  - Maintain foley until Cr plateaus.  - Continue daily weights and strict I/Os. - Avoid nephrotoxic agents including NSAIDs and IV contrast.  2. 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 Transitioned to cefepime 3/27-4/16. Recommended d/c considering potential acute interstitial nephritis as cause of kidney failure.  - Meropenum 1 g IV initiated 4/18. Planning to continue through 03/05/20.  4.Anemiaof CKD - Baseline Hgb of 10. Stable at 9today. 02/12/20:Iron35, [id number], Zacarías.Friday. -Required pRBC x 3 this admission.  - EGD 02/18/20 without significant findings. No esophageal varices. Various biopsies  obtained - results pending.   5. 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 - Per primary team.   6.LongstandingDMT2with microvascular disease - Most recent HbA1c 12.5%.  - Per primary team.   7. Recent lower extremity DVT - Heparin resumed after EGD. Continue per primary team.   Nephrology will continue to follow the care of this patient.    Marisa Sprinkles, PA-S2 [facility]

## 2020-02-19 NOTE — Plan of Care (Signed)
  Problem: Education: Goal: Knowledge of General Education information will improve Description: Including pain rating scale, medication(s)/side effects and non-pharmacologic comfort measures Outcome: Progressing   Problem: Activity: Goal: Risk for activity intolerance will decrease Outcome: Progressing   Problem: Coping: Goal: Level of anxiety will decrease Outcome: Progressing

## 2020-02-19 NOTE — Progress Notes (Signed)
PROGRESS NOTE    [name] [name]  JSE:[id number] DOB: 1956/03/22 DOA: 02/18/2020 PCP: Ann Held, DO   No chief complaint on file.   Brief Narrative:  Patient is transferred from inpatient rehab.  See progress notes as well dated 02/18/2020 from hospitalist, nephrology, and PM&R.  Briefly, patient is [name] [name] 64 year old Caucasian female with PMH CKD3, DMII, PTSD, neuropathy, hypertension, hyperlipidemia, GERD, fatty liver, cirrhosis, asthma, anemia who was recently hospitalized from 01/20/2020 until 01/29/2020, and was then discharged to inpatient rehab.    She was hospitalized for osteomyelitis of L1 and underwent anterolateral corpectomy with PSIF on 01/22/2020 by neurosurgery.  Tissue cultures grew Pseudomonas and she was also followed by ID.  She had initially been on cefepime which was to be completed on 03/05/2020 for Zain Lankford total of 6 weeks of therapy.    She started developing progressive acute on chronic renal failure and was transitioned to meropenem on 4/17 to finish completing her course.  She was also followed by nephrology as well.  She has had adequate urine output and has also remained on twice daily IV Lasix.  She has not met criteria for initiating dialysis at this time.  Suspicion is for AIN due to cefepime.  She had also been on Daven Montz heparin drip for bilateral lower extremity DVT which was diagnosed on 01/30/2020.  Also, she has had Aster Eckrich downtrending hemoglobin and has been evaluated by GI (positive hemoccult).  She underwent EGD on 02/18/2020 (she had been declining colonoscopy), her EGD report was unable to be reviewed at time of this note.   She was transferred for ongoing work-up and management by nephrology, hospitalist, GI.  Assessment & Plan:   Active Problems:   AKI (acute kidney injury) ([place])  AKI on CKD-3A with mild azotemia: History of renal transplant?  Baseline Cr 1.3-1.5>> Creatinine continues to rise today to 6.82.  Unclear etiology but concern about possible  interstitial nephritis from cefepime.  Also concern about obstructive etiology given acute urinary retention but no hydronephrosis on renal ultrasound. -Nephrology managing-on IV Lasix 80 mg twice daily.  2.3 L UOP/24 hrs.  Creatinine uptrending.  No uremia. -Cefepime discontinued.  Started on meropenem on 4/17 -Continue indwelling Foley catheter   Fluid overload: Likely due to renal failure and IV fluids.  Developed some respiratory distress and wheezing on 4/17.  BNP marginally elevated.  CXR with pulmonary congestion.  No history of CHF.  Echo in 2017 with EF of 55 to 60%.  Respiratory symptoms resolved.  Diuresed 2.2 L/ 24 hours.  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  Patient underwent anterolateral corpectomy with PSIF for osteomyelitison 01/22/2020 by neurosurgery and discharged to CIR on 4/1 on cefepime 5/7.  CRP and ESR elevated.  Imaging including CT Anikah Hogge/P, MRI T/L without contrast nonrevealing.  No leukocytosis or fever. -Now switched to meropenem in the setting of AKI.  Will continue antibiotic through 5/7. -persistent back pain since surgery, ctm with current pain regimen -Continue PT/OT  Acute urinary retention: Could be due to medications with anticholinergic side effects such as opiates, diazepam, Atarax... but difficult situation due to her uncontrolled pain.  MRI of thoracic and lumbar spine without acute significant finding. -Continue indwelling Foley catheter -Started low-dose bethanechol.  Normocytic anemia: Baseline Hgb 11-13 before previous admission> 7.3 on discharge on 4/1> 6.7 on 4/5>1u> 8.1> 6.8 on 4/13>1u> 7.7>  8.1>> 7.1>1u>8.6> 8.1> 8.7>9.0>9.1.  Hemoccult positive.  Patient is on IV heparin for DVT.  Anemia panel and LDH within normal.  Very complex situation.  -Appreciate GI help-PPI, and  EGD on 4/21 (with mild gastritis, no esophageal or fundal varices).  Patient declined colonoscopy.  -Continue monitoring - will continue heparin gtt for now - consider IVC filter if recurrent issus  Thrombocytopenia: Relatively stable.  Due to liver cirrhosis?  Relatively stable. -Continue monitoring  Uncontrolled DM-2 with CKD-3A- most recent A1c 12.5%. -Reduce SSI to renal.  -Continue statin.    Recent bilateral lower extremity DVT-diagnosed on 4/2.  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  She is nonimmune to hepatitis B  Chronic left heel ulcer: No signs of infection -Appreciate wound care  OSA? -Would benefit from outpatient sleep study  Anxiety/depression/insomnia-could be playing Loyce Flaming role in her uncontrolled pain -On Cymbalta, trazodone, Valium and Atarax.  DVT prophylaxis: heparin gtt Code Status: full Family Communication: none at bedside Disposition:  pending   Consultants:   [facility] taking over for CIR today  Nephrology  GI  ID  Procedures:   EGD 4/21 anterolateral corpectomy with PSIF for osteomyelitison 01/22/2020 by neurosurgery   Antimicrobials:  Anti-infectives (From admission, onward)   Start     Dose/Rate Route Frequency Ordered Stop   02/18/20 2330  meropenem (MERREM) 1 g in sodium chloride 0.9 % 100 mL IVPB     1 g 200 mL/hr over 30 Minutes Intravenous Every 24 hours 02/18/20 2241       Subjective: C/o back pain Chronic since surgery  Objective: Vitals:   02/19/20 0206 02/19/20 0551 02/19/20 1500 02/19/20 1624  BP: 129/66 138/75  (!) 122/52  Pulse: 80 77  80  Resp: 16 15  17   Temp: 98.7 F (37.1 C) 98.4 F (36.9 C)  98.8 F (37.1 C)  TempSrc: Oral Oral  Oral  SpO2: 93% 98%  95%  Weight:      Height:   5' 1"  (1.549 m)     Intake/Output Summary (Last 24 hours) at 02/19/2020 1634 Last data filed at 02/19/2020 1058 Gross per 24 hour    Intake 299.38 ml  Output 2650 ml  Net -2350.62 ml   Filed Weights   02/18/20 2326  Weight: 86.5 kg    Examination:  General: No acute distress. Cardiovascular: Heart sounds show Christal Lagerstrom regular rate, and rhythm. Lungs: Clear to auscultation bilaterally  Abdomen: Soft, nontender, nondistended  Neurological: Alert and oriented 3. Moves all extremities 4. Cranial nerves II through XII grossly intact. Skin: Warm and dry. No rashes or lesions. Extremities: No clubbing or cyanosis. No edema.   Data Reviewed: I have personally reviewed following labs and imaging studies  CBC: Recent Labs  Lab 02/15/20 0353 02/15/20 0353 02/16/20 0416 02/17/20 0500 02/18/20 0411 02/19/20 0420 02/19/20 0933  WBC 6.7   < > 6.3 6.2 7.4 7.8 8.7  NEUTROABS 4.1  --  3.7 3.6 4.2  --  5.5  HGB 8.5*   < > 8.6* 8.1* 8.7* 9.0* 9.1*  HCT 27.6*   < > 27.9* 26.6* 28.3* 28.7* 29.3*  MCV 88.5   < > 88.6 89.3 89.6 88.6 89.3  PLT 121*   < > 124* 123* 131* 125* 139*   < > = values in this interval not displayed.    Basic Metabolic Panel: Recent Labs  Lab 02/15/20 0353 02/15/20 0353 02/16/20 0416 02/17/20 0500  02/18/20 0411 02/18/20 1752 02/19/20 0933  NA 132*   < > 134* 135 136 139 139  K 5.0   < > 4.5 5.0 5.2* 5.1 5.2*  CL 105   < > 103 104 103 106 105  CO2 20*   < > 20* 20* 22 23 23   GLUCOSE 73   < > 63* 81 125* 96 106*  BUN 54*   < > 59* 64* 69* 70* 72*  CREATININE 4.94*   < > 5.47* 5.95* 6.35* 6.38* 6.82*  CALCIUM 8.6*   < > 8.7* 8.3* 8.6* 8.7* 8.6*  MG 2.4  --   --   --  2.4  --  2.3  PHOS  --   --   --   --  3.7  --  4.5   < > = values in this interval not displayed.    GFR: Estimated Creatinine Clearance (by C-G formula based on SCr of 6.82 mg/dL (H)) Female: 8.3 mL/min (Waylon Hershey) Female: 10.2 mL/min (Florence Yeung)  Liver Function Tests: Recent Labs  Lab 02/18/20 0411 02/19/20 0933  AST  --  17  ALT  --  9  ALKPHOS  --  112  BILITOT  --  0.2*  PROT  --  7.2  ALBUMIN 1.8* 1.9*    CBG: Recent  Labs  Lab 02/18/20 1742 02/18/20 2134 02/19/20 0702 02/19/20 1118 02/19/20 1621  GLUCAP 92 166* 115* 153* 91     No results found for this or any previous visit (from the past 240 hour(s)).       Radiology Studies: No results found.      Scheduled Meds: . atorvastatin  10 mg Oral Daily  . bethanechol  10 mg Oral TID  . Chlorhexidine Gluconate Cloth  6 each Topical Daily  . furosemide  80 mg Intravenous BID  . insulin aspart  0-9 Units Subcutaneous TID AC & HS  . lidocaine  1 patch Transdermal Q24H  . multivitamin with minerals  1 tablet Oral Daily  . pantoprazole  40 mg Oral Daily  . pregabalin  75 mg Oral QHS  . psyllium  1 packet Oral Daily  . senna-docusate  1 tablet Oral Daily  . sodium chloride flush  10-40 mL Intracatheter Q12H  . traZODone  50 mg Oral QHS   Continuous Infusions: . heparin 1,350 Units/hr (02/19/20 0804)  . meropenem (MERREM) IV 1 g (02/18/20 2337)     LOS: 1 day    Time spent: over 30 min    Fayrene Helper, MD [facility]   To contact the attending provider between 7A-7P or the covering provider during after hours 7P-7A, please log into the web site [email or web address] and access using universal [place] password for that web site. If you do not have the password, please call the hospital operator.  02/19/2020, 4:34 PM

## 2020-02-19 NOTE — Op Note (Signed)
[facility] Patient Name: [name] [name] Procedure Date : 02/18/2020 MRN: [id number] Attending MD: Jackquline Denmark , MD Date of Birth: [birth year] CSN: [id number] Age: 64 Admit Type: Inpatient Procedure:                Upper GI endoscopy Indications:              Anemia d/t blood loss. Acute on chronic anemia.                            Refuses colonoscopy (understands risks of missing                            colorectal neoplasms)                           Bilateral DVTs-Xarelto on hold. Currently on                            heparin.                           NASH Cirrhosis with mild splenomegaly.                           CKD Providers:                Jackquline Denmark, MD, Glori Bickers, RN, Laverda Sorenson,                            Technician, Eugenia Mcalpine, CRNA Referring MD:              Medicines:                Monitored Anesthesia Care Complications:            No immediate complications. Estimated Blood Loss:     Estimated blood loss: none. Procedure:                Pre-Anesthesia Assessment:                           - Prior to the procedure, a History and Physical                            was performed, and patient medications and                            allergies were reviewed. The patient's tolerance of                            previous anesthesia was also reviewed. The risks                            and benefits of the procedure and the sedation                            options and risks were discussed with the patient.  All questions were answered, and informed consent                            was obtained. Prior Anticoagulants: The patient has                            taken heparin, last dose was day of procedure. ASA                            Grade Assessment: IV - A patient with severe                            systemic disease that is a constant threat to life.                            After reviewing the risks  and benefits, the patient                            was deemed in satisfactory condition to undergo the                            procedure.                           After obtaining informed consent, the endoscope was                            passed under direct vision. Throughout the                            procedure, the patient's blood pressure, pulse, and                            oxygen saturations were monitored continuously. The                            GIF-H190 ([id number]) Olympus gastroscope was                            introduced through the mouth, and advanced to the                            second part of duodenum. The upper GI endoscopy was                            accomplished without difficulty. The patient                            tolerated the procedure well. Scope In: Scope Out: Findings:      The examined esophagus was normal. No esophageal varices      The Z-line was irregular and was found 38 cm from the incisors. Biopsies       were taken with a cold forceps for histology, directed by NBI.  Some retained food in the stomach limiting examination. Localized mild       inflammation characterized by erythema was found in the gastric antrum.       Biopsies were taken with a cold forceps for histology. No outlet       obstruction. No fundal varices.      The examined duodenum was normal. Biopsies for histology were taken with       a cold forceps for evaluation of celiac disease. Impression:               - Z-line irregular, 38 cm from the incisors.                            Biopsied.                           - Mild gastritis.                           - Retained food in the stomach limiting examination                            without outlet obstruction.                           - No esophageal or fundal varices. Recommendation:           - Return to hospital ward for ongoing care.                           - Resume previous diet.                            - Continue Protonix 40 mg p.o. once a day.                           - Await pathology results.                           - Resume heparin at prior dose today in 4 hours.                           -Trend CBC.                           -She likes to hold off on colonoscopy.                           -We will sign off for now. Please call us with any                            questions or problems. Procedure Code(s):        --- Professional ---                           [phone], Esophagogastroduodenoscopy, flexible,  transoral; with biopsy, single or multiple Diagnosis Code(s):        --- Professional ---                           K22.8, Other specified diseases of esophagus                           K29.70, Gastritis, unspecified, without bleeding                           D50.9, Iron deficiency anemia, unspecified CPT copyright 2019 American Medical Association. All rights reserved. The codes documented in this report are preliminary and upon coder review may  be revised to meet current compliance requirements. Jackquline Denmark, MD 02/18/2020 4:42:58 PM This report has been signed electronically. Number of Addenda: 0

## 2020-02-19 NOTE — Progress Notes (Signed)
ANTICOAGULATION CONSULT NOTE - Follow Up Consult  Pharmacy Consult for Heeparin Indication: DVT  Allergies  Allergen Reactions  . Bee Pollen Anaphylaxis  . Fish-Derived Products Hives, Shortness Of Breath, Swelling and Rash    Hives get in throat causing trouble breathing  . Mushroom Extract Complex Anaphylaxis  . Penicillins Anaphylaxis    **Tolerated cefepime March 2021 Did it involve swelling of the face/tongue/throat, SOB, or low BP? Yes Did it involve sudden or severe rash/hives, skin peeling, or any reaction on the inside of your mouth or nose? No Did you need to seek medical attention at a hospital or doctor's office? Yes When did it last happen?A few months ago If all above answers are "NO", may proceed with cephalosporin use.  [name] Kitchen Rosemary Oil Anaphylaxis  . Shellfish Allergy Hives, Shortness Of Breath, Swelling and Rash  . Tomato Hives and Shortness Of Breath    Hives in throat causes her trouble breathing  . Acetaminophen Other (See Comments)    GI upset  . Acyclovir And Related Other (See Comments)    Unknown rxn  . Aloe Vera Hives  . Broccoli [Brassica Oleracea] Hives  . Naproxen Other (See Comments)    Unknown rxn    Patient Measurements: Weight: 86.5 kg (190 lb 11.2 oz) Heparin Dosing Weight:   Vital Signs: Temp: 98.4 F (36.9 C) (04/22 0551) Temp Source: Oral (04/22 0551) BP: 138/75 (04/22 0551) Pulse Rate: 77 (04/22 0551)  Labs: Recent Labs    02/17/20 0500 02/17/20 0500 02/18/20 0411 02/18/20 0412 02/18/20 1752 02/19/20 0420  HGB 8.1*   < > 8.7*  --   --  9.0*  HCT 26.6*  --  28.3*  --   --  28.7*  PLT 123*  --  131*  --   --  125*  HEPARINUNFRC 0.36  --   --  0.34  --  0.25*  CREATININE 5.95*  --  6.35*  --  6.38*  --    < > = values in this interval not displayed.    Estimated Creatinine Clearance (by C-G formula based on SCr of 6.38 mg/dL (H)) Female: 8.9 mL/min (A) Female: 10.9 mL/min (A)   Assessment: Anticoag: lovenox 40>>Xarelto  for new DVT. Messaged to possibly change to IV heparin for now due to anemia. Prob not HIT, 4T score 3. Transfused 4/17. Xarelto 50m BID started 4/2>heparin 4/14 - 4/2: LE dopplers - right/left acute DVT   - HL 0.25 slightly low. Hgb 9. Plts 125 stable.  Goal of Therapy:  Heparin level 0.3-0.7 units/ml Monitor platelets by anticoagulation protocol: Yes   Plan:  Increase IV heparin to 1350 units/hr Recheck heparin level in 6 hrs. Daily HL and CBC    Gabriela Giannelli S. RAlford Highland PharmD, BCPS Clinical Staff Pharmacist [email or web address] RAlford Highland Sherrise Liberto Stillinger 02/19/2020,7:26 AM

## 2020-02-20 ENCOUNTER — Encounter: Payer: Self-pay | Admitting: *Deleted

## 2020-02-20 LAB — CBC
HCT: 29.1 % — ABNORMAL LOW (ref 36.0–46.0)
Hemoglobin: 9 g/dL — ABNORMAL LOW (ref 12.0–15.0)
MCH: 27.3 pg (ref 26.0–34.0)
MCHC: 30.9 g/dL (ref 30.0–36.0)
MCV: 88.2 fL (ref 80.0–100.0)
Platelets: 136 10*3/uL — ABNORMAL LOW (ref 150–400)
RBC: 3.3 MIL/uL — ABNORMAL LOW (ref 3.87–5.11)
RDW: 16.9 % — ABNORMAL HIGH (ref 11.5–15.5)
WBC: 9 10*3/uL (ref 4.0–10.5)
nRBC: 0 % (ref 0.0–0.2)

## 2020-02-20 LAB — FUNGUS CULTURE WITH STAIN: Fungal Source: 2

## 2020-02-20 LAB — COMPREHENSIVE METABOLIC PANEL
ALT: 11 U/L (ref 0–44)
AST: 16 U/L (ref 15–41)
Albumin: 2 g/dL — ABNORMAL LOW (ref 3.5–5.0)
Alkaline Phosphatase: 103 U/L (ref 38–126)
Anion gap: 12 (ref 5–15)
BUN: 74 mg/dL — ABNORMAL HIGH (ref 8–23)
CO2: 25 mmol/L (ref 22–32)
Calcium: 8.5 mg/dL — ABNORMAL LOW (ref 8.9–10.3)
Chloride: 101 mmol/L (ref 98–111)
Creatinine, Ser: 6.56 mg/dL — ABNORMAL HIGH (ref 0.44–1.00)
GFR calc Af Amer: 7 mL/min — ABNORMAL LOW (ref >60)
GFR calc non Af Amer: 6 mL/min — ABNORMAL LOW (ref >60)
Glucose, Bld: 103 mg/dL — ABNORMAL HIGH (ref 70–99)
Potassium: 4.5 mmol/L (ref 3.5–5.1)
Sodium: 138 mmol/L (ref 135–145)
Total Bilirubin: 0.7 mg/dL (ref 0.3–1.2)
Total Protein: 7 g/dL (ref 6.5–8.1)

## 2020-02-20 LAB — FUNGAL ORGANISM REFLEX

## 2020-02-20 LAB — GLUCOSE, CAPILLARY
Glucose-Capillary: 97 mg/dL (ref 70–99)
Glucose-Capillary: 109 mg/dL — ABNORMAL HIGH (ref 70–99)
Glucose-Capillary: 175 mg/dL — ABNORMAL HIGH (ref 70–99)
Glucose-Capillary: 236 mg/dL — ABNORMAL HIGH (ref 70–99)

## 2020-02-20 LAB — MAGNESIUM: Magnesium: 2.3 mg/dL (ref 1.7–2.4)

## 2020-02-20 LAB — SURGICAL PATHOLOGY

## 2020-02-20 LAB — FUNGUS CULTURE RESULT

## 2020-02-20 LAB — HEPARIN LEVEL (UNFRACTIONATED): Heparin Unfractionated: 0.36 IU/mL (ref 0.30–0.70)

## 2020-02-20 LAB — PHOSPHORUS: Phosphorus: 4.9 mg/dL — ABNORMAL HIGH (ref 2.5–4.6)

## 2020-02-20 NOTE — Progress Notes (Signed)
PROGRESS NOTE    [name] [name]  VZC:[id number] DOB: 1956-08-08 DOA: 02/18/2020 PCP: Ann Held, DO   No chief complaint on file.   Brief Narrative:  Patient is transferred from inpatient rehab.  See progress notes as well dated 02/18/2020 from hospitalist, nephrology, and PM&R.  Briefly, patient is [name] [name] 64 year old Caucasian female with PMH CKD3, DMII, PTSD, neuropathy, hypertension, hyperlipidemia, GERD, fatty liver, cirrhosis, asthma, anemia who was recently hospitalized from 01/20/2020 until 01/29/2020, and was then discharged to inpatient rehab.    She was hospitalized for osteomyelitis of L1 and underwent anterolateral corpectomy with PSIF on 01/22/2020 by neurosurgery.  Tissue cultures grew Pseudomonas and she was also followed by ID.  She had initially been on cefepime which was to be completed on 03/05/2020 for [name] [name] total of 6 weeks of therapy.    She started developing progressive acute on chronic renal failure and was transitioned to meropenem on 4/17 to finish completing her course.  She was also followed by nephrology as well.  She has had adequate urine output and has also remained on twice daily IV Lasix.  She has not met criteria for initiating dialysis at this time.  Suspicion is for AIN due to cefepime.  She had also been on [name] [name] heparin drip for bilateral lower extremity DVT which was diagnosed on 01/30/2020.  Also, she has had [name] [name] downtrending hemoglobin and has been evaluated by GI (positive hemoccult).  She underwent EGD on 02/18/2020 (she had been declining colonoscopy), her EGD report was unable to be reviewed at time of this note.   She was transferred for ongoing work-up and management by nephrology, hospitalist, GI.  Assessment & Plan:   Active Problems:   AKI (acute kidney injury) ([place])  AKI on CKD-3A with mild azotemia: History of renal transplant?  Baseline Cr 1.3-1.5>> Creatinine slightly improved today to 6.56.  Unclear etiology but concern about possible  interstitial nephritis from cefepime.  Also concern about obstructive etiology given acute urinary retention but no hydronephrosis on renal ultrasound. -Nephrology managing-on IV Lasix 80 mg twice daily.  4.8 L UOP/24 hrs.  Creatinine slightly improved today.  No uremia. -Cefepime discontinued.  Started on meropenem on 4/17 -Continue indwelling Foley catheter   Fluid overload: Likely due to renal failure and IV fluids.  Developed some respiratory distress and wheezing on 4/17.  BNP marginally elevated.  CXR with pulmonary congestion.  No history of CHF.  Echo in 2017 with EF of 55 to 60%.  Respiratory symptoms resolved.  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  Patient underwent anterolateral corpectomy with PSIF for osteomyelitison 01/22/2020 by neurosurgery and discharged to CIR on 4/1 on cefepime 5/7.  CRP and ESR elevated.  Imaging including CT [name] [name]/P, MRI T/L without contrast nonrevealing.  No leukocytosis or fever. -Now switched to meropenem in the setting of AKI.  Will continue antibiotic through 5/7. -persistent back pain since surgery, ctm with current pain regimen -Continue PT/OT  Acute urinary retention: Could be due to medications with anticholinergic side effects such as opiates, diazepam, Atarax... but difficult situation due to her uncontrolled pain.  MRI of thoracic and lumbar spine without acute significant finding. -Continue indwelling Foley catheter -Started low-dose bethanechol.  Normocytic anemia: Baseline Hgb 11-13 before previous admission> 7.3 on discharge on 4/1> 6.7 on 4/5>1u> 8.1> 6.8 on 4/13>1u> 7.7> 8.1>> 7.1>1u>8.6> 8.1> 8.7>9.0>9.1.  Hemoccult positive.  Patient  is on IV heparin for DVT.  Anemia panel and LDH within normal.  Very complex situation.  -Appreciate GI help-PPI, and EGD on 4/21 (with mild gastritis, no esophageal or  fundal varices).  Patient declined colonoscopy.  Follow pathology.  -Continue monitoring - will continue heparin gtt for now - consider IVC filter if recurrent issus  Thrombocytopenia: Relatively stable.  Due to liver cirrhosis?  Relatively stable, mild today. -Continue monitoring  Uncontrolled DM-2 with CKD-3A- most recent A1c 12.5%. -Reduce SSI to renal.  -Continue statin.    Recent bilateral lower extremity DVT-diagnosed on 4/2.  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  She is nonimmune to hepatitis B  Chronic left heel ulcer: No signs of infection -Appreciate wound care  OSA? -Would benefit from outpatient sleep study  Anxiety/depression/insomnia-could be playing [name] role in her uncontrolled pain -On Cymbalta, trazodone, Valium and Atarax.  DVT prophylaxis: heparin gtt Code Status: full Family Communication: none at bedside Disposition:  pending   Consultants:   [facility] taking over for CIR today  Nephrology  GI  ID  Procedures:   EGD 4/21 anterolateral corpectomy with PSIF for osteomyelitison 01/22/2020 by neurosurgery   Antimicrobials:  Anti-infectives (From admission, onward)   Start     Dose/Rate Route Frequency Ordered Stop   02/18/20 2330  meropenem (MERREM) 1 g in sodium chloride 0.9 % 100 mL IVPB     1 g 200 mL/hr over 30 Minutes Intravenous Every 24 hours 02/18/20 2241       Subjective: Continued back pain No new complaints today  Objective: Vitals:   02/19/20 1624 02/19/20 2200 02/20/20 0500 02/20/20 0800  BP: (!) 122/52  (!) 139/54 (!) 136/58  Pulse: 80  76 80  Resp: _0 Temp: 98.8 F (37.1 C)  99.1 F (37.3 C)   TempSrc: Oral  Oral   SpO2: 95%  93% 96%  Weight:  84.4 kg    Height:        Intake/Output Summary (Last 24 hours) at 02/20/2020 1630 Last data filed at 02/20/2020 0600 Gross per 24 hour  Intake 817.82 ml  Output 3100 ml    Net -2282.18 ml   Filed Weights   02/18/20 2326 02/19/20 2200  Weight: 86.5 kg 84.4 kg    Examination:  General: No acute distress. Cardiovascular: Heart sounds show Koki Buxton regular rate, and rhythm.  Lungs: Clear to auscultation bilaterally  Abdomen: Soft, nontender, nondistended  Neurological: Alert and oriented 3. Moves all extremities 4 . Cranial nerves II through XII grossly intact. Skin: Warm and dry. No rashes or lesions. Extremities: No clubbing or cyanosis. No edema.   Data Reviewed: I have personally reviewed following labs and imaging studies  CBC: Recent Labs  Lab 02/15/20 0353 02/15/20 0353 02/16/20 0416 02/16/20 0416 02/17/20 0500 02/18/20 0411 02/19/20 0420 02/19/20 0933 02/20/20 0302  WBC 6.7   < > 6.3   < > 6.2 7.4 7.8 8.7 9.0  NEUTROABS 4.1  --  3.7  --  3.6 4.2  --  5.5  --   HGB 8.5*   < > 8.6*   < > 8.1* 8.7* 9.0* 9.1* 9.0*  HCT 27.6*   < > 27.9*   < > 26.6* 28.3* 28.7* 29.3* 29.1*  MCV 88.5   < > 88.6   < > 89.3 89.6 88.6 89.3 88.2  PLT 121*   < > 124*   < >  123* 131* 125* 139* 136*   < > = values in this interval not displayed.    Basic Metabolic Panel: Recent Labs  Lab 02/15/20 0353 02/16/20 0416 02/17/20 0500 02/18/20 0411 02/18/20 1752 02/19/20 0933 02/20/20 0302  NA 132*   < > 135 136 139 139 138  K 5.0   < > 5.0 5.2* 5.1 5.2* 4.5  CL 105   < > 104 103 106 105 101  CO2 20*   < > 20* 22 23 23 25  GLUCOSE 73   < > 81 125* 96 106* 103*  BUN 54*   < > 64* 69* 70* 72* 74*  CREATININE 4.94*   < > 5.95* 6.35* 6.38* 6.82* 6.56*  CALCIUM 8.6*   < > 8.3* 8.6* 8.7* 8.6* 8.5*  MG 2.4  --   --  2.4  --  2.3 2.3  PHOS  --   --   --  3.7  --  4.5 4.9*   < > = values in this interval not displayed.    GFR: Estimated Creatinine Clearance (by C-G formula based on SCr of 6.56 mg/dL (H)) Female: 8.5 mL/min (A) Female: 10.5 mL/min (A)  Liver Function Tests: Recent Labs  Lab 02/18/20 0411 02/19/20 0933 02/20/20 0302  AST  --  17 16  ALT  --   9 11  ALKPHOS  --  112 103  BILITOT  --  0.2* 0.7  PROT  --  7.2 7.0  ALBUMIN 1.8* 1.9* 2.0*    CBG: Recent Labs  Lab 02/19/20 0702 02/19/20 1118 02/19/20 1621 02/19/20 2142 02/20/20 1140  GLUCAP 115* 153* 91 119* 109*     No results found for this or any previous visit (from the past 240 hour(s)).       Radiology Studies: No results found.      Scheduled Meds: . atorvastatin  10 mg Oral Daily  . bethanechol  10 mg Oral TID  . Chlorhexidine Gluconate Cloth  6 each Topical Daily  . furosemide  80 mg Intravenous BID  . insulin aspart  0-9 Units Subcutaneous TID AC & HS  . lidocaine  1 patch Transdermal Q24H  . multivitamin with minerals  1 tablet Oral Daily  . pantoprazole  40 mg Oral Daily  . pregabalin  75 mg Oral QHS  . psyllium  1 packet Oral Daily  . senna-docusate  1 tablet Oral Daily  . sodium chloride flush  10-40 mL Intracatheter Q12H  . traZODone  50 mg Oral QHS   Continuous Infusions: . heparin 1,350 Units/hr (02/20/20 1333)  . meropenem (MERREM) IV Stopped (02/19/20 2316)     LOS: 2 days    Time spent: over 30 min    Caldwell Powell, MD [facility]   To contact the attending provider between 7A-7P or the covering provider during after hours 7P-7A, please log into the web site [email or web address] and access using universal [place] password for that web site. If you do not have the password, please call the hospital operator.  02/20/2020, 4:30 PM    

## 2020-02-20 NOTE — Progress Notes (Signed)
[facility] Progress Note    Assessment/Plan: [name] [name] is a 64 y.o female and patient of [facility] with CKD stage III due to uncontrolled DM with proteinuria with an extensive PMH including HTN and septic arthritis of the R elbow. 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 Transitioned to CIR here4/1/21 then noted steady rise of Cr over the  from 1.6 to 2.95 along with urinary retention. CT A/P as well as renal US ruled out obstruction 02/11/20. Nephrology consulted. BaselineSCr noted at 1.35-1.99.Required Foley placement 02/11/20.Cefepime DC'd, added meropenum 4/18. UOP great.Patient transitioned from CIR to 27M under hospitalist care 02/18/20 due to medical conditions and decreased capacity to participate in rigorous therapy. Monitoring persistently elevated Scr which is downtrending as of 4/23 to 6.56. Continue supportive care.  1. AKI on CKD stage III, nonoliguric, subnephrotic, likely AIN due to cefepime - UA 4/16 with 30 protein.FeNa 2.2. C3/C4 normal. -No apparent reversible issues. Suspect AINdue to cefepimebut no improvement with switch tomeropenum 4/18. Currently day 7 since discontinuation of likely offending agent.  - Persistent nausea but good appetite. Manage supportively.  - Cr improved slightly from 6.82 to 6.56today. Made great urine yesterday, 4.8 L on furosemide 80 mg IV BID. Kstable at 4.5. Phos up to 4.9. Will monitor.   PLAN: - Hopeful for continued GFR recovery with supportive care. Without overt uremia and with improving Cr, dialysis not indicated at this time.  - Maintain Foley for now. Can discuss voiding trial with q6hr bladder scans if Cr continues to downtrend over the weekend.  -Continuedaily BMP, daily weights, and strict I/Os. - Avoid nephrotoxic agents including NSAIDs and IV contrast.  2. Vertebral OM with pseudomonas s/p L1 corpectomy w/ fixation  - Receivedvancomycin  3/25-3/27 with FQN/rifampin. Transitioned to cefepime 3/27-4/16. Recommended d/c considering potential acute interstitial nephritis as cause of kidney failure.  - Meropenum 1 g IV initiated 4/18. Planning to continue through 03/05/20.  3.Anemiaof CKD - Baseline Hgb of 10. Stable at 9today. 02/12/20:Iron35, [id number], [place].Friday. -Required pRBC x 3 this admission. - EGD 02/18/20 largely negative for evaluation of anemia/GIB.  4. 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 - Per primary team.   5.LongstandingDMT2with microvascular disease - Most recent HbA1c 12.5%.  - Per primary team.   6. Recent lower extremity DVT - Heparin resumed after EGD. Continue per primary team.   Nephrology will continue to follow the care of this patient.   Marisa Sprinkles, PA-S2 [facility]   Subjective:    Patient evaluated this morning. Noting significant pain in back. Examined Foley catheter and low back for superficial concerns like ulcers. None found. Pain likely attributed to recent major lumbar surgery. Nursing in the room to administer medication. Primary team managing incision care. She endorses decent appetite though persistent nausea. She denies vomiting. No acute events overnight. She is happy to hear that her kidney function has not worsened today and that the likelihood of starting dialysis is quite low. She was in good spirits and joking with the nephrology team.   Objective: Vitals:   02/19/20 1624 02/19/20 2200 02/20/20 0500 02/20/20 0800  BP: (!) 122/52  (!) 139/54 (!) 136/58  Pulse: 80  76 80  Resp: 17  20 18   Temp: 98.8 F (37.1 C)  99.1 F (37.3 C)   TempSrc: Oral  Oral   SpO2: 95%  93% 96%  Weight:  84.4 kg    Height:  Physical Exam: General: Pleasant female lying in hospital bed. Foley catheter in place. NAD.A&Ox4.  Heart:RRR, S1/S2 distinct. No rubs. Lungs:CTA BL. Normal respiratory effort  without use of accessory mcucles. Abdomen:BS +. Soft, mildly distended though nontender. Extremities:Traceedema of BL LE.Some asterixis.   Filed Weights   02/18/20 2326 02/19/20 2200  Weight: 86.5 kg 84.4 kg    Intake/Output Summary (Last 24 hours) at 02/20/2020 1239 Last data filed at 02/20/2020 0600 Gross per 24 hour  Intake 817.82 ml  Output 3100 ml  Net -2282.18 ml    Additional Objective Labs: Basic Metabolic Panel: Recent Labs  Lab 02/18/20 0411 02/18/20 0411 02/18/20 1752 02/19/20 0933 02/20/20 0302  NA 136   < > 139 139 138  K 5.2*   < > 5.1 5.2* 4.5  CL 103   < > 106 105 101  CO2 22   < > 23 23 25   GLUCOSE 125*   < > 96 106* 103*  BUN 69*   < > 70* 72* 74*  CREATININE 6.35*   < > 6.38* 6.82* 6.56*  CALCIUM 8.6*   < > 8.7* 8.6* 8.5*  PHOS 3.7  --   --  4.5 4.9*   < > = values in this interval not displayed.   Liver Function Tests: Recent Labs  Lab 02/18/20 0411 02/19/20 0933 02/20/20 0302  AST  --  17 16  ALT  --  9 11  ALKPHOS  --  112 103  BILITOT  --  0.2* 0.7  PROT  --  7.2 7.0  ALBUMIN 1.8* 1.9* 2.0*   No results for input(s): LIPASE, AMYLASE in the last 168 hours. CBC: Recent Labs  Lab 02/17/20 0500 02/17/20 0500 02/18/20 0411 02/18/20 0411 02/19/20 0420 02/19/20 0933 02/20/20 0302  WBC 6.2   < > 7.4   < > 7.8 8.7 9.0  NEUTROABS 3.6  --  4.2  --   --  5.5  --   HGB 8.1*   < > 8.7*   < > 9.0* 9.1* 9.0*  HCT 26.6*   < > 28.3*   < > 28.7* 29.3* 29.1*  MCV 89.3  --  89.6  --  88.6 89.3 88.2  PLT 123*   < > 131*   < > 125* 139* 136*   < > = values in this interval not displayed.   Blood Culture    Component Value Date/Time   SDES TISSUE 01/22/2020 1706   SPECREQUEST 2 LUMBAR 1 01/22/2020 1706   CULT  01/22/2020 1706    RARE PSEUDOMONAS AERUGINOSA CRITICAL RESULT CALLED TO, READ BACK BY AND VERIFIED WITH: RN J [id number] FCP NO ANAEROBES ISOLATED Performed at [facility], [place] [address], [place], [place]  [address]    REPTSTATUS 01/27/2020 FINAL 01/22/2020 1706    Cardiac Enzymes: No results for input(s): CKTOTAL, CKMB, CKMBINDEX, TROPONINI in the last 168 hours. CBG: Recent Labs  Lab 02/19/20 0702 02/19/20 1118 02/19/20 1621 02/19/20 2142 02/20/20 1140  GLUCAP 115* 153* 91 119* 109*   Iron Studies: No results for input(s): IRON, TIBC, TRANSFERRIN, FERRITIN in the last 72 hours. Lab Results  Component Value Date   INR 1.2 01/22/2020   INR 1.1 04/19/2019   INR 1.16 04/09/2017   Studies/Results: No results found.  Medications: . heparin 1,350 Units/hr (02/20/20 0600)  . meropenem (MERREM) IV Stopped (02/19/20 2316)   . atorvastatin  10 mg Oral Daily  . bethanechol  10 mg Oral TID  .  Chlorhexidine Gluconate Cloth  6 each Topical Daily  . furosemide  80 mg Intravenous BID  . insulin aspart  0-9 Units Subcutaneous TID AC & HS  . lidocaine  1 patch Transdermal Q24H  . multivitamin with minerals  1 tablet Oral Daily  . pantoprazole  40 mg Oral Daily  . pregabalin  75 mg Oral QHS  . psyllium  1 packet Oral Daily  . senna-docusate  1 tablet Oral Daily  . sodium chloride flush  10-40 mL Intracatheter Q12H  . traZODone  50 mg Oral QHS

## 2020-02-20 NOTE — Progress Notes (Signed)
ANTICOAGULATION CONSULT NOTE - Follow Up Consult  Pharmacy Consult for Heparin Indication: DVT  Allergies  Allergen Reactions  . Bee Pollen Anaphylaxis  . Fish-Derived Products Hives, Shortness Of Breath, Swelling and Rash    Hives get in throat causing trouble breathing  . Mushroom Extract Complex Anaphylaxis  . Penicillins Anaphylaxis    **Tolerated cefepime March 2021 Did it involve swelling of the face/tongue/throat, SOB, or low BP? Yes Did it involve sudden or severe rash/hives, skin peeling, or any reaction on the inside of your mouth or nose? No Did you need to seek medical attention at a hospital or doctor's office? Yes When did it last happen?A few months ago If all above answers are "NO", may proceed with cephalosporin use.  [name] Kitchen Rosemary Oil Anaphylaxis  . Shellfish Allergy Hives, Shortness Of Breath, Swelling and Rash  . Tomato Hives and Shortness Of Breath    Hives in throat causes her trouble breathing  . Acetaminophen Other (See Comments)    GI upset  . Acyclovir And Related Other (See Comments)    Unknown rxn  . Aloe Vera Hives  . Broccoli [Brassica Oleracea] Hives  . Naproxen Other (See Comments)    Unknown rxn    Patient Measurements: Height: 5' 1"  (154.9 cm)(from 01/20/20 records) Weight: 84.4 kg (186 lb) IBW/kg (Calculated) : 47.8 Heparin Dosing Weight:    Vital Signs: Temp: 99.1 F (37.3 C) (04/23 0500) Temp Source: Oral (04/23 0500) BP: 139/54 (04/23 0500) Pulse Rate: 76 (04/23 0500)  Labs: Recent Labs    02/18/20 0411 02/18/20 1752 02/19/20 0420 02/19/20 0420 02/19/20 0933 02/19/20 1457 02/20/20 0302  HGB   < >  --  9.0*   < > 9.1*  --  9.0*  HCT   < >  --  28.7*  --  29.3*  --  29.1*  PLT   < >  --  125*  --  139*  --  136*  HEPARINUNFRC   < >  --  0.25*  --   --  0.35 0.36  CREATININE  --  6.38*  --   --  6.82*  --  6.56*   < > = values in this interval not displayed.    Estimated Creatinine Clearance (by C-G formula based on SCr  of 6.56 mg/dL (H)) Female: 8.5 mL/min (A) Female: 10.5 mL/min (A)   Assessment:  Anticoag: lovenox 40>>Xarelto for new DVT. Changed to IV heparin for now due to anemia. Prob not HIT, 4T score 3. Transfused 4/17. Xarelto 1m BID started 4/2>heparin 4/14 - 4/2: LE dopplers - right/left acute DVT   - HL 0.36 in goal. Hgb 9 stable, Plts 136 stable.  Goal of Therapy:  Heparin level 0.3-0.7 units/ml Monitor platelets by anticoagulation protocol: Yes   Plan:  Con't IV heparin to 1350 units/hr Daily HL and CBC    Travante Knee S. RAlford Highland PharmD, BCPS Clinical Staff Pharmacist [email or web address] RAlford Highland CEldridge4/23/2021,8:42 AM

## 2020-02-21 LAB — COMPREHENSIVE METABOLIC PANEL
ALT: 9 U/L (ref 0–44)
AST: 16 U/L (ref 15–41)
Albumin: 2.1 g/dL — ABNORMAL LOW (ref 3.5–5.0)
Alkaline Phosphatase: 103 U/L (ref 38–126)
Anion gap: 11 (ref 5–15)
BUN: 77 mg/dL — ABNORMAL HIGH (ref 8–23)
CO2: 28 mmol/L (ref 22–32)
Calcium: 8.6 mg/dL — ABNORMAL LOW (ref 8.9–10.3)
Chloride: 97 mmol/L — ABNORMAL LOW (ref 98–111)
Creatinine, Ser: 6.25 mg/dL — ABNORMAL HIGH (ref 0.44–1.00)
GFR calc Af Amer: 8 mL/min — ABNORMAL LOW (ref >60)
GFR calc non Af Amer: 6 mL/min — ABNORMAL LOW (ref >60)
Glucose, Bld: 154 mg/dL — ABNORMAL HIGH (ref 70–99)
Potassium: 4.2 mmol/L (ref 3.5–5.1)
Sodium: 136 mmol/L (ref 135–145)
Total Bilirubin: 0.4 mg/dL (ref 0.3–1.2)
Total Protein: 7.6 g/dL (ref 6.5–8.1)

## 2020-02-21 LAB — GLUCOSE, CAPILLARY
Glucose-Capillary: 116 mg/dL — ABNORMAL HIGH (ref 70–99)
Glucose-Capillary: 169 mg/dL — ABNORMAL HIGH (ref 70–99)
Glucose-Capillary: 171 mg/dL — ABNORMAL HIGH (ref 70–99)
Glucose-Capillary: 173 mg/dL — ABNORMAL HIGH (ref 70–99)
Glucose-Capillary: 227 mg/dL — ABNORMAL HIGH (ref 70–99)

## 2020-02-21 LAB — CBC
HCT: 30.3 % — ABNORMAL LOW (ref 36.0–46.0)
Hemoglobin: 9.7 g/dL — ABNORMAL LOW (ref 12.0–15.0)
MCH: 27.9 pg (ref 26.0–34.0)
MCHC: 32 g/dL (ref 30.0–36.0)
MCV: 87.1 fL (ref 80.0–100.0)
Platelets: 133 10*3/uL — ABNORMAL LOW (ref 150–400)
RBC: 3.48 MIL/uL — ABNORMAL LOW (ref 3.87–5.11)
RDW: 16.6 % — ABNORMAL HIGH (ref 11.5–15.5)
WBC: 10.4 10*3/uL (ref 4.0–10.5)
nRBC: 0 % (ref 0.0–0.2)

## 2020-02-21 LAB — HEPARIN LEVEL (UNFRACTIONATED): Heparin Unfractionated: 0.34 IU/mL (ref 0.30–0.70)

## 2020-02-21 LAB — MAGNESIUM: Magnesium: 2.3 mg/dL (ref 1.7–2.4)

## 2020-02-21 LAB — PHOSPHORUS: Phosphorus: 5.3 mg/dL — ABNORMAL HIGH (ref 2.5–4.6)

## 2020-02-21 MED ORDER — POLYETHYLENE GLYCOL 3350 17 G PO PACK
17.0000 g | PACK | Freq: Two times a day (BID) | ORAL | Status: DC
  Administered 2020-02-21 – 2020-02-26 (×9): 17 g via ORAL
  Filled 2020-02-21 (×10): qty 1

## 2020-02-21 MED ORDER — SENNOSIDES-DOCUSATE SODIUM 8.6-50 MG PO TABS
2.0000 | ORAL_TABLET | Freq: Every day | ORAL | Status: DC
  Administered 2020-02-21 – 2020-02-26 (×5): 2 via ORAL
  Filled 2020-02-21 (×5): qty 2

## 2020-02-21 NOTE — Progress Notes (Signed)
ANTICOAGULATION CONSULT NOTE - Follow Up Consult  Pharmacy Consult for Heparin Indication: DVT  Allergies  Allergen Reactions  . Bee Pollen Anaphylaxis  . Fish-Derived Products Hives, Shortness Of Breath, Swelling and Rash    Hives get in throat causing trouble breathing  . Mushroom Extract Complex Anaphylaxis  . Penicillins Anaphylaxis    **Tolerated cefepime March 2021 Did it involve swelling of the face/tongue/throat, SOB, or low BP? Yes Did it involve sudden or severe rash/hives, skin peeling, or any reaction on the inside of your mouth or nose? No Did you need to seek medical attention at a hospital or doctor's office? Yes When did it last happen?A few months ago If all above answers are "NO", may proceed with cephalosporin use.  [name] Kitchen Rosemary Oil Anaphylaxis  . Shellfish Allergy Hives, Shortness Of Breath, Swelling and Rash  . Tomato Hives and Shortness Of Breath    Hives in throat causes her trouble breathing  . Acetaminophen Other (See Comments)    GI upset  . Acyclovir And Related Other (See Comments)    Unknown rxn  . Aloe Vera Hives  . Broccoli [Brassica Oleracea] Hives  . Naproxen Other (See Comments)    Unknown rxn    Patient Measurements: Height: 5' 1"  (154.9 cm)(from 01/20/20 records) Weight: 83.2 kg (183 lb 6.8 oz) IBW/kg (Calculated) : 47.8 Heparin Dosing Weight:    Vital Signs: Temp: 98.3 F (36.8 C) (04/24 0546) Temp Source: Oral (04/24 0546) BP: 133/68 (04/24 0546) Pulse Rate: 79 (04/23 2054)  Labs: Recent Labs    02/19/20 0420 02/19/20 0933 02/19/20 0933 02/19/20 1457 02/20/20 0302 02/21/20 0355  HGB  --  9.1*   < >  --  9.0* 9.7*  HCT  --  29.3*  --   --  29.1* 30.3*  PLT  --  139*  --   --  136* 133*  HEPARINUNFRC   < >  --   --  0.35 0.36 0.34  CREATININE  --  6.82*  --   --  6.56* 6.25*   < > = values in this interval not displayed.    Estimated Creatinine Clearance (by C-G formula based on SCr of 6.25 mg/dL (H)) Female: 8.9 mL/min  (A) Female: 10.9 mL/min (A)   Assessment:  Anticoag: lovenox 40>>Xarelto for new DVT. Changed to IV heparin for now due to anemia. Prob not HIT, 4T score 3. Transfused 4/17. Xarelto 65m BID started 4/2>heparin 4/14 - 4/2: LE dopplers - right/left acute DVT   - HL 0.34 in goal. Hgb 9.7 up, Plts 133.  Goal of Therapy:  Heparin level 0.3-0.7 units/ml Monitor platelets by anticoagulation protocol: Yes   Plan:  Con't IV heparin to 1350 units/hr Daily HL and CBC    [name] Searson S. RAlford [name] [name] [name], [name] [name] Clinical Staff Pharmacist [email or web address] RAlford [name] [name] [name] 02/21/2020,7:32 AM

## 2020-02-21 NOTE — Plan of Care (Signed)
?  Problem: Elimination: ?Goal: Will not experience complications related to urinary retention ?Outcome: Progressing ?  ?

## 2020-02-21 NOTE — Progress Notes (Signed)
Admit: 02/18/2020 LOS: 3  75F AoCKD3, unclear etiology; cirrhosis;   Subjective:  . Improving SCr, good UOP . Pt w/o some constipation  04/23 0701 - 04/24 0700 In: 1162.1 [P.O.:920; I.V.:242.1] Out: 1801 [Urine:1800; Stool:1]  Filed Weights   02/18/20 2326 02/19/20 2200 02/20/20 2054  Weight: 86.5 kg 84.4 kg 83.2 kg    Scheduled Meds: . atorvastatin  10 mg Oral Daily  . bethanechol  10 mg Oral TID  . Chlorhexidine Gluconate Cloth  6 each Topical Daily  . furosemide  80 mg Intravenous BID  . insulin aspart  0-9 Units Subcutaneous TID AC & HS  . lidocaine  1 patch Transdermal Q24H  . multivitamin with minerals  1 tablet Oral Daily  . pantoprazole  40 mg Oral Daily  . pregabalin  75 mg Oral QHS  . psyllium  1 packet Oral Daily  . senna-docusate  1 tablet Oral Daily  . sodium chloride flush  10-40 mL Intracatheter Q12H  . traZODone  50 mg Oral QHS   Continuous Infusions: . heparin 1,350 Units/hr (02/21/20 0809)  . meropenem (MERREM) IV 1 g (02/20/20 2356)   PRN Meds:.acetaminophen, oxyCODONE, sodium chloride flush  Current Labs: reviewed  Results for KAILY, WRAGG (MRN [id number]) as of 02/13/2020 16:04  Ref. Range 02/12/2020 14:30  Sodium, Urine Latest Units: mmol/L 44   Results for TATJANA, TURCOTT (MRN [id number]) as of 02/14/2020 12:15  Ref. Range 02/13/2020 16:30  Protein Creatinine Ratio Latest Ref Range: 0.00 - 0.15 mg/mgCre 2.73 (H)   Pending C3 and C4  Physical Exam:  Blood pressure (!) 119/57, pulse 77, temperature (!) 97.5 F (36.4 C), temperature source Oral, resp. rate 18, height 5' 1"  (1.549 m), weight 83.2 kg, SpO2 93 %. Chronically ill-appearing, lying in bed, NAD Regular, normal S1-S2 Obese, mild distention, soft, nontender Trace LEE Clear bilaterally, normal work of breathing Foley catheter in place Has a little bit of asterixis, otherwise nonfocal  A 1. 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  Changed ABX for ? AIN.  Now improving, Cont supportive care, foley catheter at this time 2. 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 Longstanding DM 2 with severe microvascular disease 4. Cirrhosis 5. Anemia, transfuse prn  P . As above, cont supportive care . We will follow closely . Daily weights, Daily Renal Panel, Strict I/Os, Avoid nephrotoxins (NSAIDs, judicious IV Contrast)    Pearson Grippe MD 02/21/2020, 11:07 AM  Recent Labs  Lab 02/19/20 0933 02/20/20 0302 02/21/20 0355  NA 139 138 136  K 5.2* 4.5 4.2  CL 105 101 97*  CO2 23 25 28   GLUCOSE 106* 103* 154*  BUN 72* 74* 77*  CREATININE 6.82* 6.56* 6.25*  CALCIUM 8.6* 8.5* 8.6*  PHOS 4.5 4.9* 5.3*   Recent Labs  Lab 02/17/20 0500 02/17/20 0500 02/18/20 0411 02/19/20 0420 02/19/20 0933 02/20/20 0302 02/21/20 0355  WBC 6.2   < > 7.4   < > 8.7 9.0 10.4  NEUTROABS 3.6  --  4.2  --  5.5  --   --   HGB 8.1*   < > 8.7*   < > 9.1* 9.0* 9.7*  HCT 26.6*   < > 28.3*   < > 29.3* 29.1* 30.3*  MCV 89.3   < > 89.6   < > 89.3 88.2  87.1  PLT 123*   < > 131*   < > 139* 136* 133*   < > = values in this interval not displayed.

## 2020-02-21 NOTE — Progress Notes (Signed)
PROGRESS NOTE    [name] [name]  BWI:[id number] DOB: Jun 29, 1956 DOA: 02/18/2020 PCP: Ann Held, DO   No chief complaint on file.   Brief Narrative:  Patient is transferred from inpatient rehab.  See progress notes as well dated 02/18/2020 from hospitalist, nephrology, and PM&R.  Briefly, patient is [name] [name] 65 year old Caucasian female with PMH CKD3, DMII, PTSD, neuropathy, hypertension, hyperlipidemia, GERD, fatty liver, cirrhosis, asthma, anemia who was recently hospitalized from 01/20/2020 until 01/29/2020, and was then discharged to inpatient rehab.    She was hospitalized for osteomyelitis of L1 and underwent anterolateral corpectomy with PSIF on 01/22/2020 by neurosurgery.  Tissue cultures grew Pseudomonas and she was also followed by ID.  She had initially been on cefepime which was to be completed on 03/05/2020 for Kataleya Zaugg total of 6 weeks of therapy.    She started developing progressive acute on chronic renal failure and was transitioned to meropenem on 4/17 to finish completing her course.  She was also followed by nephrology as well.  She has had adequate urine output and has also remained on twice daily IV Lasix.  She has not met criteria for initiating dialysis at this time.  Suspicion is for AIN due to cefepime.  She had also been on Leena Tiede heparin drip for bilateral lower extremity DVT which was diagnosed on 01/30/2020.  Also, she has had Greogry Goodwyn downtrending hemoglobin and has been evaluated by GI (positive hemoccult).  She underwent EGD on 02/18/2020 (she had been declining colonoscopy), her EGD report was unable to be reviewed at time of this note.   She was transferred for ongoing work-up and management by nephrology, hospitalist, GI.  Assessment & Plan:   Active Problems:   AKI (acute kidney injury) ([place])  AKI on CKD-3A with mild azotemia: History of renal transplant?  Baseline Cr 1.3-1.5>> Creatinine slightly improved today to 6.25.  Unclear etiology but concern about possible  interstitial nephritis from cefepime.  Also concern about obstructive etiology given acute urinary retention but no hydronephrosis on renal ultrasound. -Nephrology managing-on IV Lasix 80 mg twice daily.  1.8 L UOP/24 hrs.  Creatinine slightly improved today.  No uremia. -Cefepime discontinued.  Started on meropenem on 4/17 -Continue indwelling Foley catheter   Fluid overload: Likely due to renal failure and IV fluids.  Developed some respiratory distress and wheezing on 4/17.  BNP marginally elevated.  CXR with pulmonary congestion.  No history of CHF.  Echo in 2017 with EF of 55 to 60%.  Respiratory symptoms resolved.  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  Patient underwent anterolateral corpectomy with PSIF for osteomyelitison 01/22/2020 by neurosurgery and discharged to CIR on 4/1 on cefepime 5/7.  CRP and ESR elevated.  Imaging including CT Aleshka Corney/P, MRI T/L without contrast nonrevealing.  No leukocytosis or fever. -Now switched to meropenem in the setting of AKI.  Will continue antibiotic through 5/7. -persistent back pain since surgery, ctm with current pain regimen -Continue PT/OT  Acute urinary retention: Could be due to medications with anticholinergic side effects such as opiates, diazepam, Atarax... but difficult situation due to her uncontrolled pain.  MRI of thoracic and lumbar spine without acute significant finding - "conus and cauda equina appear normal". -Continue indwelling Foley catheter -Started low-dose bethanechol.  Normocytic anemia: Baseline Hgb 11-13 before previous admission> 7.3 on discharge on 4/1> 6.7 on 4/5>1u> 8.1> 6.8 on 4/13>1u> 7.7> 8.1>> 7.1>1u>8.6>  8.1> 8.7>9.0>9.1.  Hemoccult positive.  Patient is on IV heparin for DVT.  Anemia panel and LDH within normal.  Very complex situation.  -Appreciate GI help-PPI, and EGD on  4/21 (with mild gastritis, no esophageal or fundal varices).  Patient declined colonoscopy.  Follow pathology.  -Continue monitoring - will continue heparin gtt for now - consider IVC filter if recurrent issus  Thrombocytopenia: Relatively stable.  Due to liver cirrhosis?  Relatively stable, mild today. -Continue monitoring  Uncontrolled DM-2 with CKD-3A- most recent A1c 12.5%. -Reduce SSI to renal.  -Continue statin.    Recent bilateral lower extremity DVT-diagnosed on 4/2.  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  She is nonimmune to hepatitis B  Chronic left heel ulcer: No signs of infection -Appreciate wound care  OSA? -Would benefit from outpatient sleep study  Anxiety/depression/insomnia-could be playing Gwenette Wellons role in her uncontrolled pain -On Cymbalta, trazodone, Valium and Atarax.  DVT prophylaxis: heparin gtt Code Status: full Family Communication: none at bedside Disposition:  pending   Consultants:   [facility] taking over for CIR today  Nephrology  GI  ID  Procedures:   EGD 4/21 anterolateral corpectomy with PSIF for osteomyelitison 01/22/2020 by neurosurgery   Antimicrobials:  Anti-infectives (From admission, onward)   Start     Dose/Rate Route Frequency Ordered Stop   02/18/20 2330  meropenem (MERREM) 1 g in sodium chloride 0.9 % 100 mL IVPB     1 g 200 mL/hr over 30 Minutes Intravenous Every 24 hours 02/18/20 2241       Subjective: C/o constipation today  Objective: Vitals:   02/20/20 0800 02/20/20 2054 02/21/20 0546 02/21/20 0859  BP: (!) 136/58 135/60 133/68 (!) 119/57  Pulse: 80 79  77  Resp: 18 20 20 18   Temp:  98.4 F (36.9 C) 98.3 F (36.8 C) (!) 97.5 F (36.4 C)  TempSrc:  Oral Oral Oral  SpO2: 96% 93%    Weight:  83.2 kg    Height:        Intake/Output Summary (Last 24 hours) at 02/21/2020 1422 Last data filed at 02/21/2020 1000 Gross per  24 hour  Intake 1222.08 ml  Output 2351 ml  Net -1128.92 ml   Filed Weights   02/18/20 2326 02/19/20 2200 02/20/20 2054  Weight: 86.5 kg 84.4 kg 83.2 kg    Examination:  General: No acute distress. Cardiovascular: Heart sounds show Dustyn Dansereau regular rate, and rhythm.  Lungs: Clear to auscultation bilaterally Abdomen: Soft, nontender, nondistended Neurological: Alert and oriented 3. Moves all extremities 4. Cranial nerves II through XII grossly intact. Skin: Warm and dry. No rashes or lesions. Extremities: No clubbing or cyanosis. No edema.   Data Reviewed: I have personally reviewed following labs and imaging studies  CBC: Recent Labs  Lab 02/15/20 0353 02/15/20 0353 02/16/20 0416 02/16/20 0416 02/17/20 0500 02/17/20 0500 02/18/20 0411 02/19/20 0420 02/19/20 0933 02/20/20 0302 02/21/20 0355  WBC 6.7   < > 6.3   < > 6.2   < > 7.4 7.8 8.7 9.0 10.4  NEUTROABS 4.1  --  3.7  --  3.6  --  4.2  --  5.5  --   --   HGB 8.5*   < > 8.6*   < > 8.1*   < > 8.7* 9.0* 9.1* 9.0* 9.7*  HCT 27.6*   < > 27.9*   < > 26.6*   < > 28.3* 28.7*  29.3* 29.1* 30.3*  MCV 88.5   < > 88.6   < > 89.3   < > 89.6 88.6 89.3 88.2 87.1  PLT 121*   < > 124*   < > 123*   < > 131* 125* 139* 136* 133*   < > = values in this interval not displayed.    Basic Metabolic Panel: Recent Labs  Lab 02/15/20 0353 02/16/20 0416 02/18/20 0411 02/18/20 1752 02/19/20 0933 02/20/20 0302 02/21/20 0355  NA 132*   < > 136 139 139 138 136  K 5.0   < > 5.2* 5.1 5.2* 4.5 4.2  CL 105   < > 103 106 105 101 97*  CO2 20*   < > 22 23 23 25 28   GLUCOSE 73   < > 125* 96 106* 103* 154*  BUN 54*   < > 69* 70* 72* 74* 77*  CREATININE 4.94*   < > 6.35* 6.38* 6.82* 6.56* 6.25*  CALCIUM 8.6*   < > 8.6* 8.7* 8.6* 8.5* 8.6*  MG 2.4  --  2.4  --  2.3 2.3 2.3  PHOS  --   --  3.7  --  4.5 4.9* 5.3*   < > = values in this interval not displayed.    GFR: Estimated Creatinine Clearance (by C-G formula based on SCr of 6.25 mg/dL  (H)) Female: 8.9 mL/min (Einer Meals) Female: 10.9 mL/min (Ahmya Bernick)  Liver Function Tests: Recent Labs  Lab 02/18/20 0411 02/19/20 0933 02/20/20 0302 02/21/20 0355  AST  --  17 16 16   ALT  --  9 11 9   ALKPHOS  --  112 103 103  BILITOT  --  0.2* 0.7 0.4  PROT  --  7.2 7.0 7.6  ALBUMIN 1.8* 1.9* 2.0* 2.1*    CBG: Recent Labs  Lab 02/20/20 1140 02/20/20 1724 02/20/20 2104 02/21/20 0652 02/21/20 1136  GLUCAP 109* 175* 236* 116* 169*     No results found for this or any previous visit (from the past 240 hour(s)).       Radiology Studies: No results found.      Scheduled Meds: . atorvastatin  10 mg Oral Daily  . bethanechol  10 mg Oral TID  . Chlorhexidine Gluconate Cloth  6 each Topical Daily  . furosemide  80 mg Intravenous BID  . insulin aspart  0-9 Units Subcutaneous TID AC & HS  . lidocaine  1 patch Transdermal Q24H  . multivitamin with minerals  1 tablet Oral Daily  . pantoprazole  40 mg Oral Daily  . pregabalin  75 mg Oral QHS  . psyllium  1 packet Oral Daily  . senna-docusate  1 tablet Oral Daily  . sodium chloride flush  10-40 mL Intracatheter Q12H  . traZODone  50 mg Oral QHS   Continuous Infusions: . heparin 1,350 Units/hr (02/21/20 0809)  . meropenem (MERREM) IV 1 g (02/20/20 2356)     LOS: 3 days    Time spent: over 39 min    Fayrene Helper, MD [facility]   To contact the attending provider between 7A-7P or the covering provider during after hours 7P-7A, please log into the web site [email or web address] and access using universal [place] password for that web site. If you do not have the password, please call the hospital operator.  02/21/2020, 2:22 PM

## 2020-02-21 NOTE — Plan of Care (Signed)
  Problem: Education: Goal: Knowledge of General Education information will improve Description Including pain rating scale, medication(s)/side effects and non-pharmacologic comfort measures Outcome: Progressing   

## 2020-02-22 LAB — CBC
HCT: 28.4 % — ABNORMAL LOW (ref 36.0–46.0)
Hemoglobin: 8.8 g/dL — ABNORMAL LOW (ref 12.0–15.0)
MCH: 27.1 pg (ref 26.0–34.0)
MCHC: 31 g/dL (ref 30.0–36.0)
MCV: 87.4 fL (ref 80.0–100.0)
Platelets: 120 10*3/uL — ABNORMAL LOW (ref 150–400)
RBC: 3.25 MIL/uL — ABNORMAL LOW (ref 3.87–5.11)
RDW: 17.1 % — ABNORMAL HIGH (ref 11.5–15.5)
WBC: 9.4 10*3/uL (ref 4.0–10.5)
nRBC: 0 % (ref 0.0–0.2)

## 2020-02-22 LAB — PHOSPHORUS: Phosphorus: 5.4 mg/dL — ABNORMAL HIGH (ref 2.5–4.6)

## 2020-02-22 LAB — COMPREHENSIVE METABOLIC PANEL
ALT: 9 U/L (ref 0–44)
AST: 17 U/L (ref 15–41)
Albumin: 2 g/dL — ABNORMAL LOW (ref 3.5–5.0)
Alkaline Phosphatase: 90 U/L (ref 38–126)
Anion gap: 12 (ref 5–15)
BUN: 77 mg/dL — ABNORMAL HIGH (ref 8–23)
CO2: 27 mmol/L (ref 22–32)
Calcium: 8.6 mg/dL — ABNORMAL LOW (ref 8.9–10.3)
Chloride: 98 mmol/L (ref 98–111)
Creatinine, Ser: 5.89 mg/dL — ABNORMAL HIGH (ref 0.44–1.00)
GFR calc Af Amer: 8 mL/min — ABNORMAL LOW (ref >60)
GFR calc non Af Amer: 7 mL/min — ABNORMAL LOW (ref >60)
Glucose, Bld: 159 mg/dL — ABNORMAL HIGH (ref 70–99)
Potassium: 4.3 mmol/L (ref 3.5–5.1)
Sodium: 137 mmol/L (ref 135–145)
Total Bilirubin: 0.5 mg/dL (ref 0.3–1.2)
Total Protein: 7.1 g/dL (ref 6.5–8.1)

## 2020-02-22 LAB — HEPARIN LEVEL (UNFRACTIONATED): Heparin Unfractionated: 0.39 IU/mL (ref 0.30–0.70)

## 2020-02-22 LAB — GLUCOSE, CAPILLARY
Glucose-Capillary: 156 mg/dL — ABNORMAL HIGH (ref 70–99)
Glucose-Capillary: 275 mg/dL — ABNORMAL HIGH (ref 70–99)
Glucose-Capillary: 110 mg/dL — ABNORMAL HIGH (ref 70–99)
Glucose-Capillary: 162 mg/dL — ABNORMAL HIGH (ref 70–99)

## 2020-02-22 LAB — MAGNESIUM: Magnesium: 2.2 mg/dL (ref 1.7–2.4)

## 2020-02-22 MED ORDER — FUROSEMIDE 80 MG PO TABS
80.0000 mg | ORAL_TABLET | Freq: Two times a day (BID) | ORAL | Status: DC
  Administered 2020-02-22 – 2020-02-23 (×2): 80 mg via ORAL
  Filled 2020-02-22 (×2): qty 1

## 2020-02-22 NOTE — Plan of Care (Signed)
  Problem: Clinical Measurements: Goal: Diagnostic test results will improve Outcome: Progressing   

## 2020-02-22 NOTE — Progress Notes (Signed)
Admit: 02/18/2020 LOS: 4  86F AoCKD3, unclear etiology; cirrhosis;   Subjective:  . Improving SCr, good UOP . Pt w/o some constipation; has prunes/prune juice at bedside  04/24 0701 - 04/25 0700 In: 1885.8 [P.O.:1280; I.V.:405.8; IV Piggyback:200] Out: 2651 [Urine:2650; Stool:1]  Filed Weights   02/19/20 2200 02/20/20 2054 02/21/20 2123  Weight: 84.4 kg 83.2 kg 83.2 kg    Scheduled Meds: . atorvastatin  10 mg Oral Daily  . bethanechol  10 mg Oral TID  . Chlorhexidine Gluconate Cloth  6 each Topical Daily  . furosemide  80 mg Intravenous BID  . insulin aspart  0-9 Units Subcutaneous TID AC & HS  . lidocaine  1 patch Transdermal Q24H  . multivitamin with minerals  1 tablet Oral Daily  . pantoprazole  40 mg Oral Daily  . polyethylene glycol  17 g Oral BID  . pregabalin  75 mg Oral QHS  . psyllium  1 packet Oral Daily  . senna-docusate  2 tablet Oral QHS  . sodium chloride flush  10-40 mL Intracatheter Q12H  . traZODone  50 mg Oral QHS   Continuous Infusions: . heparin 1,350 Units/hr (02/22/20 0246)  . meropenem (MERREM) IV 1 g (02/21/20 2359)   PRN Meds:.acetaminophen, oxyCODONE, sodium chloride flush  Current Labs: reviewed  Results for [name], [name] (MRN [id number]) as of 02/13/2020 16:04  Ref. Range 02/12/2020 14:30  Sodium, Urine Latest Units: mmol/L 44   Results for [name], [name] (MRN [id number]) as of 02/14/2020 12:15  Ref. Range 02/13/2020 16:30  Protein Creatinine Ratio Latest Ref Range: 0.00 - 0.15 mg/mgCre 2.73 (H)   Pending C3 and C4  Physical Exam:  Blood pressure 122/68, pulse 82, temperature 98.2 F (36.8 C), temperature source Oral, resp. rate 18, height 5' 1"  (1.549 m), weight 83.2 kg, SpO2 94 %. Chronically ill-appearing, lying in bed, NAD Regular, normal S1-S2 Obese, mild distention, soft, nontender Trace LEE Clear bilaterally, normal work of breathing Foley catheter in place Has a little bit of asterixis, otherwise  nonfocal  A 1. 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  Changed ABX for ? AIN.  Now improving, Cont supportive care, foley catheter at this time 2. 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 Longstanding DM 2 with severe microvascular disease 4. Cirrhosis 5. Anemia, transfuse prn 6. Constipation  P . As above, cont supportive care . Change lasix ot PO 80 BID . Advised against prune juice, ordered miralax . We will follow closely . Daily weights, Daily Renal Panel, Strict I/Os, Avoid nephrotoxins (NSAIDs, judicious IV Contrast)    Pearson Grippe MD 02/22/2020, 10:19 AM  Recent Labs  Lab 02/20/20 0302 02/21/20 0355 02/22/20 0408  NA 138 136 137  K 4.5 4.2 4.3  CL 101 97* 98  CO2 25 28 27   GLUCOSE 103* 154* 159*  BUN 74* 77* 77*  CREATININE 6.56* 6.25* 5.89*  CALCIUM 8.5* 8.6* 8.6*  PHOS 4.9* 5.3* 5.4*   Recent Labs  Lab 02/17/20 0500 02/17/20 0500 02/18/20 0411 02/19/20 0420 02/19/20 0933 02/19/20 0933 02/20/20 0302 02/21/20 0355 02/22/20 0408  WBC 6.2   < > 7.4   < > 8.7   < > 9.0 10.4 9.4  NEUTROABS 3.6  --  4.2  --  5.5  --   --   --   --  HGB 8.1*   < > 8.7*   < > 9.1*   < > 9.0* 9.7* 8.8*  HCT 26.6*   < > 28.3*   < > 29.3*   < > 29.1* 30.3* 28.4*  MCV 89.3   < > 89.6   < > 89.3   < > 88.2 87.1 87.4  PLT 123*   < > 131*   < > 139*   < > 136* 133* 120*   < > = values in this interval not displayed.

## 2020-02-22 NOTE — Evaluation (Signed)
Physical Therapy Evaluation Patient Details Name: [name] [name] MRN: [id number] DOB: August 18, 1956 Today's Date: 02/22/2020   History of Present Illness  64 year old Caucasian female with PMH CKD3, DMII, PTSD, neuropathy, hypertension, hyperlipidemia, GERD, fatty liver, cirrhosis, asthma, anemia who was recently hospitalized from 01/20/2020 until 01/29/2020, and was then discharged to inpatient rehab. She was hospitalized for osteomyelitis of L1 and underwent anterolateral corpectomy with PSIF on 01/22/2020 by neurosurgery. started developing progressive acute on chronic renal failure, transferred back to acute Care on 4/21  Clinical Impression   Pt admitted with above diagnosis. Comes from CIR where she was working on increasing functional independence in order to be able to dc home; Presents to PT with decr functional mobility, decr activity tolerance, pain with mobility; Despite pain and discomfort, [name] [name] still participated well, and agreed to get on the [facility] for BM and not use the bedpan; she remains motivated to get better, and hopes to be able to dc home -- we discussed her goal to get home, and based on her performance today, at this time, PT recommends going back to CIR once medically stable;  Pt currently with functional limitations due to the deficits listed below (see PT Problem List). Pt will benefit from skilled PT to increase their independence and safety with mobility to allow discharge to the venue listed below.       Follow Up Recommendations CIR    Equipment Recommendations  Rolling walker with 5" wheels;3in1 (PT)    Recommendations for Other Services       Precautions / Restrictions Precautions Precautions: Back;Fall;Other (comment)(has had issues w/orthostatic hypotension) Precaution Comments: Reviewed Back Precautions Required Braces or Orthoses: Spinal Brace Spinal Brace: Thoracolumbosacral orthotic;Applied in supine position Restrictions Other Position/Activity  Restrictions: TLSO donned in supine, must be worn when HOB >30deg; watch for Orthostatics; legally blind      Mobility  Bed Mobility Overal bed mobility: Needs Assistance Bed Mobility: Rolling;Sidelying to Sit Rolling: Min assist Sidelying to sit: Mod assist       General bed mobility comments: Cues for technique; limited by pain, but still participating fully  Transfers Overall transfer level: Needs assistance Equipment used: Rolling walker (2 wheeled) Transfers: Sit to/from [facility] Sit to Stand: Mod assist Stand pivot transfers: Mod assist;+2 physical assistance       General transfer comment: Cues for hand placement and safety; Mod assist to power up, painful, but, participating  Ambulation/Gait Ambulation/Gait assistance: Mod assist;+2 safety/equipment Gait Distance (Feet): (pivotal steps bed to [facility] to recliner) Assistive device: Rolling walker (2 wheeled) Gait Pattern/deviations: Shuffle     General Gait Details: limited tolerance of being upright  Stairs            Wheelchair Mobility    Modified Rankin (Stroke Patients Only)       Balance                                             Pertinent Vitals/Pain Pain Assessment: Faces Faces Pain Scale: Hurts even more Pain Location: lumbar region; and discomfort moving bowels Pain Descriptors / Indicators: Aching;Discomfort;Sore Pain Intervention(s): Monitored during session    Home Living Family/patient expects to be discharged to:: Private residence Living Arrangements: Spouse/significant other;Other relatives;Non-relatives/Friends Available Help at Discharge: Family;Available PRN/intermittently;Personal care attendant Type of Home: Apartment Home Access: Stairs to enter   Entrance Stairs-Number of Steps: 2 Home Layout: One  level Home Equipment: Walker - 2 wheels;Bedside commode;Electric scooter Additional Comments: scooter    Prior Function Level of  Independence: Needs assistance   Gait / Transfers Assistance Needed: Leading up to the acute admission that lead to CIR stay, had not been ambulating for past 3 weeks. Has been doing stand pivot transfers from bed to bedside commode or chair w/ assist from husband and/or aide.   ADL's / Homemaking Assistance Needed: Requires mod-max assistance for feeding, bathing, and dressing.  Comments: Legally blind, enjoys Teacher, early years/pre, painting birdhouses     Hand Dominance   Dominant Hand: Left    Extremity/Trunk Assessment   Upper Extremity Assessment Upper Extremity Assessment: Generalized weakness    Lower Extremity Assessment Lower Extremity Assessment: Generalized weakness(Difficult to assess due to pain)       Communication   Communication: No difficulties  Cognition Arousal/Alertness: Awake/alert Behavior During Therapy: WFL for tasks assessed/performed Overall Cognitive Status: Within Functional Limits for tasks assessed                                        General Comments General comments (skin integrity, edema, etc.): Reports dizziness with upright activity; consider orthostatics next session    Exercises     Assessment/Plan    PT Assessment Patient needs continued PT services  PT Problem List Decreased strength;Decreased range of motion;Decreased activity tolerance;Decreased mobility;Decreased coordination;Decreased knowledge of use of DME;Decreased knowledge of precautions       PT Treatment Interventions DME instruction;Gait training;Functional mobility training;Therapeutic activities;Therapeutic exercise;Balance training;Patient/family education;Stair training    PT Goals (Current goals can be found in the Care Plan section)  Acute Rehab PT Goals Patient Stated Goal: to have less pain; is interested in rehab  PT Goal Formulation: With patient Time For Goal Achievement: 03/07/20 Potential to Achieve Goals: Good    Frequency Min 3X/week    Barriers to discharge        Co-evaluation               AM-PAC PT "6 Clicks" Mobility  Outcome Measure Help needed turning from your back to your side while in a flat bed without using bedrails?: A Little Help needed moving from lying on your back to sitting on the side of a flat bed without using bedrails?: A Lot Help needed moving to and from a bed to a chair (including a wheelchair)?: A Lot Help needed standing up from a chair using your arms (e.g., wheelchair or bedside chair)?: A Lot Help needed to walk in hospital room?: A Lot Help needed climbing 3-5 steps with a railing? : A Lot 6 Click Score: 13    End of Session Equipment Utilized During Treatment: Back brace;Gait belt Activity Tolerance: Patient tolerated treatment well Patient left: in chair;with call bell/phone within reach;with chair alarm set Nurse Communication: Mobility status PT Visit Diagnosis: Muscle weakness (generalized) (M62.81);Other abnormalities of gait and mobility (R26.89);Pain Pain - Right/Left: (back) Pain - part of body: (back)    Time: 0900-0950(minus approx 8 minutes on BSC) PT Time Calculation (min) (ACUTE ONLY): 50 min   Charges:   PT Evaluation $PT Eval Moderate Complexity: 1 Mod PT Treatments $Therapeutic Activity: 23-37 mins        Roney Marion, PT  Acute Rehabilitation Services Pager [phone] Office [phone]   Colletta [place] 02/22/2020, 5:28 PM

## 2020-02-22 NOTE — Plan of Care (Signed)
  Problem: Education: Goal: Knowledge of General Education information will improve Description: Including pain rating scale, medication(s)/side effects and non-pharmacologic comfort measures Outcome: Progressing   Problem: Coping: Goal: Level of anxiety will decrease Outcome: Progressing   Problem: Pain Managment: Goal: General experience of comfort will improve Outcome: Progressing   

## 2020-02-22 NOTE — Progress Notes (Signed)
PROGRESS NOTE    [name] [name]  MRN:[id number] DOB: 09/04/1956 DOA: 02/18/2020 PCP: Lowne Chase, Yvonne R, DO   No chief complaint on file.   Brief Narrative:  Patient is transferred from inpatient rehab.  See progress notes as well dated 02/18/2020 from hospitalist, nephrology, and PM&R.  Briefly, patient is a 64-year-old Caucasian female with PMH CKD3, DMII, PTSD, neuropathy, hypertension, hyperlipidemia, GERD, fatty liver, cirrhosis, asthma, anemia who was recently hospitalized from 01/20/2020 until 01/29/2020, and was then discharged to inpatient rehab.    She was hospitalized for osteomyelitis of L1 and underwent anterolateral corpectomy with PSIF on 01/22/2020 by neurosurgery.  Tissue cultures grew Pseudomonas and she was also followed by ID.  She had initially been on cefepime which was to be completed on 03/05/2020 for a total of 6 weeks of therapy.    She started developing progressive acute on chronic renal failure and was transitioned to meropenem on 4/17 to finish completing her course.  She was also followed by nephrology as well.  She has had adequate urine output and has also remained on twice daily IV Lasix.  She has not met criteria for initiating dialysis at this time.  Suspicion is for AIN due to cefepime.  She had also been on a heparin drip for bilateral lower extremity DVT which was diagnosed on 01/30/2020.  Also, she has had a downtrending hemoglobin and has been evaluated by GI (positive hemoccult).  She underwent EGD on 02/18/2020 (she had been declining colonoscopy), her EGD report was unable to be reviewed at time of this note.   She was transferred for ongoing work-up and management by nephrology, hospitalist, GI.  Assessment & Plan:   Active Problems:   AKI (acute kidney injury) (HCC)  AKI on CKD-3A with mild azotemia: History of renal transplant?  Baseline Cr 1.3-1.5>> Creatinine slightly improved today to 5.89.  Unclear etiology but concern about possible  interstitial nephritis from cefepime.  Also concern about obstructive etiology given acute urinary retention but no hydronephrosis on renal ultrasound. -Nephrology managing-on PO lasix 80 mg BID.  2.6 L UOP/24 hrs.  Creatinine slightly improved today.  No uremia. -Cefepime discontinued.  Started on meropenem on 4/17 -Continue indwelling Foley catheter   Fluid overload: Likely due to renal failure and IV fluids.  Developed some respiratory distress and wheezing on 4/17.  BNP marginally elevated.  CXR with pulmonary congestion.  No history of CHF.  Echo in 2017 with EF of 55 to 60%.  Respiratory symptoms resolved.  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  Patient underwent anterolateral corpectomy with PSIF for osteomyelitison 01/22/2020 by neurosurgery and discharged to CIR on 4/1 on cefepime 5/7.  CRP and ESR elevated.  Imaging including CT A/P, MRI T/L without contrast nonrevealing.  No leukocytosis or fever. -Now switched to meropenem in the setting of AKI.  Will continue antibiotic through 5/7. -persistent back pain since surgery, ctm with current pain regimen -Continue PT/OT  Acute urinary retention: Could be due to medications with anticholinergic side effects such as opiates, diazepam, Atarax... but difficult situation due to her uncontrolled pain.  MRI of thoracic and lumbar spine without acute significant finding - "conus and cauda equina appear normal". -Continue indwelling Foley catheter -Started low-dose bethanechol.  Normocytic anemia: Baseline Hgb 11-13 before previous admission> 7.3 on discharge on 4/1> 6.7 on 4/5>1u> 8.1> 6.8 on 4/13>1u> 7.7> 8.1>> 7.1>1u>8.6> 8.1>   8.7>9.0>9.1.  Hemoccult positive.  Patient is on IV heparin for DVT.  Anemia panel and LDH within normal.  Very complex situation.  -Appreciate GI help-PPI, and EGD on 4/21 (with  mild gastritis, no esophageal or fundal varices).  Patient declined colonoscopy.  Follow pathology.  -Continue monitoring - will continue heparin gtt for now - consider IVC filter if recurrent issus  Thrombocytopenia: Relatively stable.  Due to liver cirrhosis?  Relatively stable, mild today. -Continue monitoring  Uncontrolled DM-2 with CKD-3A- most recent A1c 12.5%. -Reduce SSI to renal.  -Continue statin.    Recent bilateral lower extremity DVT-diagnosed on 4/2.  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  She is nonimmune to hepatitis B  Chronic left heel ulcer: No signs of infection -Appreciate wound care  OSA? -Would benefit from outpatient sleep study  Anxiety/depression/insomnia-could be playing a role in her uncontrolled pain -On Cymbalta, trazodone, Valium and Atarax.  DVT prophylaxis: heparin gtt Code Status: full Family Communication: none at bedside Disposition:  pending   Consultants:   [facility] taking over for CIR today  Nephrology  GI  ID  Procedures:   EGD 4/21 anterolateral corpectomy with PSIF for osteomyelitison 01/22/2020 by neurosurgery   Antimicrobials:  Anti-infectives (From admission, onward)   Start     Dose/Rate Route Frequency Ordered Stop   02/18/20 2330  meropenem (MERREM) 1 g in sodium chloride 0.9 % 100 mL IVPB     1 g 200 mL/hr over 30 Minutes Intravenous Every 24 hours 02/18/20 2241       Subjective: Asking to get back to chair   Objective: Vitals:   02/21/20 1605 02/21/20 2123 02/22/20 0519 02/22/20 0900  BP: 120/70 112/64 126/72 122/68  Pulse: 78 85 81 82  Resp: 18 17 16 18  Temp: 98.8 F (37.1 C) 98.3 F (36.8 C) 98.4 F (36.9 C) 98.2 F (36.8 C)  TempSrc: Oral Oral  Oral  SpO2: 94% 94% 93% 94%  Weight:  83.2 kg    Height:        Intake/Output Summary (Last 24 hours) at 02/22/2020 1422 Last data filed at 02/22/2020  1300 Gross per 24 hour  Intake 1805.79 ml  Output 2076 ml  Net -270.21 ml   Filed Weights   02/19/20 2200 02/20/20 2054 02/21/20 2123  Weight: 84.4 kg 83.2 kg 83.2 kg    Examination:  General: No acute distress. Cardiovascular: Heart sounds show a regular rate, and rhythm. Lungs: Clear to auscultation bilaterally  Abdomen: Soft, nontender, nondistended  Neurological: Alert and oriented 3. Moves all extremities 4. Cranial nerves II through XII grossly intact. Skin: Warm and dry. No rashes or lesions. Extremities: No clubbing or cyanosis. No edema.   Data Reviewed: I have personally reviewed following labs and imaging studies  CBC: Recent Labs  Lab 02/16/20 0416 02/16/20 0416 02/17/20 0500 02/17/20 0500 02/18/20 0411 02/18/20 0411 02/19/20 0420 02/19/20 0933 02/20/20 0302 02/21/20 0355 02/22/20 0408  WBC 6.3   < > 6.2   < > 7.4   < > 7.8 8.7 9.0 10.4 9.4  NEUTROABS 3.7  --  3.6  --  4.2  --   --  5.5  --   --   --   HGB 8.6*   < > 8.1*   < > 8.7*   < > 9.0* 9.1* 9.0* 9.7* 8.8*  HCT 27.9*   < > 26.6*   < > 28.3*   < >   28.7* 29.3* 29.1* 30.3* 28.4*  MCV 88.6   < > 89.3   < > 89.6   < > 88.6 89.3 88.2 87.1 87.4  PLT 124*   < > 123*   < > 131*   < > 125* 139* 136* 133* 120*   < > = values in this interval not displayed.    Basic Metabolic Panel: Recent Labs  Lab 02/18/20 0411 02/18/20 0411 02/18/20 1752 02/19/20 0933 02/20/20 0302 02/21/20 0355 02/22/20 0408  NA 136   < > 139 139 138 136 137  K 5.2*   < > 5.1 5.2* 4.5 4.2 4.3  CL 103   < > 106 105 101 97* 98  CO2 22   < > 23 23 25 28 27  GLUCOSE 125*   < > 96 106* 103* 154* 159*  BUN 69*   < > 70* 72* 74* 77* 77*  CREATININE 6.35*   < > 6.38* 6.82* 6.56* 6.25* 5.89*  CALCIUM 8.6*   < > 8.7* 8.6* 8.5* 8.6* 8.6*  MG 2.4  --   --  2.3 2.3 2.3 2.2  PHOS 3.7  --   --  4.5 4.9* 5.3* 5.4*   < > = values in this interval not displayed.    GFR: Estimated Creatinine Clearance (by C-G formula based on SCr of  5.89 mg/dL (H)) Female: 9.4 mL/min (A) Female: 11.6 mL/min (A)  Liver Function Tests: Recent Labs  Lab 02/18/20 0411 02/19/20 0933 02/20/20 0302 02/21/20 0355 02/22/20 0408  AST  --  17 16 16 17  ALT  --  9 11 9 9  ALKPHOS  --  112 103 103 90  BILITOT  --  0.2* 0.7 0.4 0.5  PROT  --  7.2 7.0 7.6 7.1  ALBUMIN 1.8* 1.9* 2.0* 2.1* 2.0*    CBG: Recent Labs  Lab 02/21/20 1602 02/21/20 2124 02/21/20 2330 02/22/20 0655 02/22/20 1117  GLUCAP 171* 227* 173* 156* 275*     No results found for this or any previous visit (from the past 240 hour(s)).       Radiology Studies: No results found.      Scheduled Meds: . atorvastatin  10 mg Oral Daily  . bethanechol  10 mg Oral TID  . Chlorhexidine Gluconate Cloth  6 each Topical Daily  . furosemide  80 mg Oral BID  . insulin aspart  0-9 Units Subcutaneous TID AC & HS  . lidocaine  1 patch Transdermal Q24H  . multivitamin with minerals  1 tablet Oral Daily  . pantoprazole  40 mg Oral Daily  . polyethylene glycol  17 g Oral BID  . pregabalin  75 mg Oral QHS  . psyllium  1 packet Oral Daily  . senna-docusate  2 tablet Oral QHS  . sodium chloride flush  10-40 mL Intracatheter Q12H  . traZODone  50 mg Oral QHS   Continuous Infusions: . heparin 1,350 Units/hr (02/22/20 0246)  . meropenem (MERREM) IV 1 g (02/21/20 2359)     LOS: 4 days    Time spent: over 30 min    Caldwell Powell, MD [facility]   To contact the attending provider between 7A-7P or the covering provider during after hours 7P-7A, please log into the web site [email or web address] and access using universal [place] password for that web site. If you do not have the password, please call the hospital operator.  02/22/2020, 2:22 PM    

## 2020-02-22 NOTE — Progress Notes (Signed)
ANTICOAGULATION CONSULT NOTE - Follow Up Consult  Pharmacy Consult for Heparin Indication: DVT  Allergies  Allergen Reactions  . Bee Pollen Anaphylaxis  . Fish-Derived Products Hives, Shortness Of Breath, Swelling and Rash    Hives get in throat causing trouble breathing  . Mushroom Extract Complex Anaphylaxis  . Penicillins Anaphylaxis    **Tolerated cefepime March 2021 Did it involve swelling of the face/tongue/throat, SOB, or low BP? Yes Did it involve sudden or severe rash/hives, skin peeling, or any reaction on the inside of your mouth or nose? No Did you need to seek medical attention at a hospital or doctor's office? Yes When did it last happen?A few months ago If all above answers are "NO", may proceed with cephalosporin use.  [name] Kitchen Rosemary Oil Anaphylaxis  . Shellfish Allergy Hives, Shortness Of Breath, Swelling and Rash  . Tomato Hives and Shortness Of Breath    Hives in throat causes her trouble breathing  . Acetaminophen Other (See Comments)    GI upset  . Acyclovir And Related Other (See Comments)    Unknown rxn  . Aloe Vera Hives  . Broccoli [Brassica Oleracea] Hives  . Naproxen Other (See Comments)    Unknown rxn    Patient Measurements: Height: 5' 1"  (154.9 cm)(from 01/20/20 records) Weight: 83.2 kg (183 lb 7 oz) IBW/kg (Calculated) : 47.8 Heparin Dosing Weight:    Vital Signs: Temp: 98.4 F (36.9 C) (04/25 0519) Temp Source: Oral (04/24 2123) BP: 126/72 (04/25 0519) Pulse Rate: 81 (04/25 0519)  Labs: Recent Labs    02/20/20 0302 02/20/20 0302 02/21/20 0355 02/22/20 0408  HGB 9.0*   < > 9.7* 8.8*  HCT 29.1*  --  30.3* 28.4*  PLT 136*  --  133* 120*  HEPARINUNFRC 0.36  --  0.34 0.39  CREATININE 6.56*  --  6.25* 5.89*   < > = values in this interval not displayed.    Estimated Creatinine Clearance (by C-G formula based on SCr of 5.89 mg/dL (H)) Female: 9.4 mL/min (A) Female: 11.6 mL/min (A)   Assessment:  Anticoag: lovenox 40>>Xarelto for  new DVT. Changed to IV heparin for now due to anemia. Prob not HIT, 4T score 3. Transfused 4/17. Xarelto 37m BID started 4/2>heparin 4/14 - 4/2: LE dopplers - right/left acute DVT   - HL 0.39 in goal. Hgb 8.8 relatively stable., Plts 120 down.  Goal of Therapy:  Heparin level 0.3-0.7 units/ml Monitor platelets by anticoagulation protocol: Yes   Plan:  Con't IV heparin to 1350 units/hr Daily HL and CBC    Nalea Salce S. RAlford Highland PharmD, BCPS Clinical Staff Pharmacist [email or web address] RAlford Highland Eddi Hymes Stillinger 02/22/2020,7:44 AM

## 2020-02-23 LAB — HEPARIN LEVEL (UNFRACTIONATED): Heparin Unfractionated: 0.37 IU/mL (ref 0.30–0.70)

## 2020-02-23 LAB — CBC
HCT: 26.3 % — ABNORMAL LOW (ref 36.0–46.0)
Hemoglobin: 8.1 g/dL — ABNORMAL LOW (ref 12.0–15.0)
MCH: 27.4 pg (ref 26.0–34.0)
MCHC: 30.8 g/dL (ref 30.0–36.0)
MCV: 88.9 fL (ref 80.0–100.0)
Platelets: 111 10*3/uL — ABNORMAL LOW (ref 150–400)
RBC: 2.96 MIL/uL — ABNORMAL LOW (ref 3.87–5.11)
RDW: 17.2 % — ABNORMAL HIGH (ref 11.5–15.5)
WBC: 10.6 10*3/uL — ABNORMAL HIGH (ref 4.0–10.5)
nRBC: 0 % (ref 0.0–0.2)

## 2020-02-23 LAB — COMPREHENSIVE METABOLIC PANEL
ALT: 13 U/L (ref 0–44)
AST: 20 U/L (ref 15–41)
Albumin: 2 g/dL — ABNORMAL LOW (ref 3.5–5.0)
Alkaline Phosphatase: 86 U/L (ref 38–126)
Anion gap: 11 (ref 5–15)
BUN: 86 mg/dL — ABNORMAL HIGH (ref 8–23)
CO2: 28 mmol/L (ref 22–32)
Calcium: 8.7 mg/dL — ABNORMAL LOW (ref 8.9–10.3)
Chloride: 97 mmol/L — ABNORMAL LOW (ref 98–111)
Creatinine, Ser: 6.26 mg/dL — ABNORMAL HIGH (ref 0.44–1.00)
GFR calc Af Amer: 8 mL/min — ABNORMAL LOW (ref >60)
GFR calc non Af Amer: 6 mL/min — ABNORMAL LOW (ref >60)
Glucose, Bld: 129 mg/dL — ABNORMAL HIGH (ref 70–99)
Potassium: 4 mmol/L (ref 3.5–5.1)
Sodium: 136 mmol/L (ref 135–145)
Total Bilirubin: 0.5 mg/dL (ref 0.3–1.2)
Total Protein: 7.1 g/dL (ref 6.5–8.1)

## 2020-02-23 LAB — MAGNESIUM: Magnesium: 2.3 mg/dL (ref 1.7–2.4)

## 2020-02-23 LAB — PHOSPHORUS: Phosphorus: 5.7 mg/dL — ABNORMAL HIGH (ref 2.5–4.6)

## 2020-02-23 LAB — GLUCOSE, CAPILLARY
Glucose-Capillary: 100 mg/dL — ABNORMAL HIGH (ref 70–99)
Glucose-Capillary: 134 mg/dL — ABNORMAL HIGH (ref 70–99)
Glucose-Capillary: 173 mg/dL — ABNORMAL HIGH (ref 70–99)
Glucose-Capillary: 168 mg/dL — ABNORMAL HIGH (ref 70–99)

## 2020-02-23 LAB — HEMOGLOBIN AND HEMATOCRIT, BLOOD
HCT: 26.3 % — ABNORMAL LOW (ref 36.0–46.0)
Hemoglobin: 8.2 g/dL — ABNORMAL LOW (ref 12.0–15.0)

## 2020-02-23 MED ORDER — DOXYCYCLINE MONOHYDRATE 100 MG PO TAB
100 mg | ORAL_TABLET | Freq: Two times a day (BID) | ORAL | 0 refills | 8.00000 days | Status: AC
Start: 2020-02-23 — End: ?

## 2020-02-23 MED ORDER — SODIUM CHLORIDE 0.9 % IV SOLN: INTRAVENOUS | Status: AC

## 2020-02-23 MED ORDER — ONDANSETRON HCL 4 MG/2ML IJ SOLN
4.0000 mg | Freq: Four times a day (QID) | INTRAMUSCULAR | Status: DC | PRN
  Administered 2020-02-23: 4 mg via INTRAVENOUS
  Filled 2020-02-23: qty 2

## 2020-02-23 NOTE — Progress Notes (Signed)
Admit: 02/18/2020 LOS: 5  Subjective:  Note lasix was changed to 80 mg PO BID.  She had 925 mL UOP charted over 4/25.  Feels ok today.  Discouraged about kidneys not any better  Review of systems:  Denies n/v Denies shortness of breath or chest pain  Backside hurts  Had BM   04/25 0701 - 04/26 0700 In: 1094.9 [P.O.:720; I.V.:274.9; IV Piggyback:100] Out: 925 [Urine:925]  Filed Weights   02/19/20 2200 02/20/20 2054 02/21/20 2123  Weight: 84.4 kg 83.2 kg 83.2 kg    Scheduled Meds: . atorvastatin  10 mg Oral Daily  . bethanechol  10 mg Oral TID  . Chlorhexidine Gluconate Cloth  6 each Topical Daily  . furosemide  80 mg Oral BID  . insulin aspart  0-9 Units Subcutaneous TID AC & HS  . lidocaine  1 patch Transdermal Q24H  . multivitamin with minerals  1 tablet Oral Daily  . pantoprazole  40 mg Oral Daily  . polyethylene glycol  17 g Oral BID  . pregabalin  75 mg Oral QHS  . psyllium  1 packet Oral Daily  . senna-docusate  2 tablet Oral QHS  . sodium chloride flush  10-40 mL Intracatheter Q12H  . traZODone  50 mg Oral QHS   Continuous Infusions: . heparin 1,350 Units/hr (02/22/20 2220)  . meropenem (MERREM) IV 1 g (02/22/20 2332)   PRN Meds:.acetaminophen, oxyCODONE, sodium chloride flush  Current Labs: reviewed   Results for JAMANI, ELEY (MRN [id number]) as of 02/14/2020 12:15  Ref. Range 02/13/2020 16:30  Protein Creatinine Ratio Latest Ref Range: 0.00 - 0.15 mg/mgCre 2.73 (H)   C3 and C4 normal   Physical Exam:  Blood pressure 120/63, pulse 81, temperature 97.6 F (36.4 C), resp. rate 16, height 5' 1"  (1.549 m), weight 83.2 kg, SpO2 95 %. Chronically ill-appearing, lying in bed, NAD  Clear lungs and unlabored on room air at rest Regular, normal S1-S2 no rub Obese, mild distention, soft, nontender No lower extremity edema   Foley catheter in place Psych normal mood and affect  Neuro - provides hx and follows commands; alert  A 1. 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  Changed ABX for ? AIN.  Unsure if pre-renal component contributing  - Continue foley and supportive care  - reassess steroids  - stop lasix for now  - Normal saline at 75/hr x 12 hours  2. supportive care, foley catheter at this time 3. Pseudomonal discitis s/p cefepime (moving to meropenem, through 5/7) status post corpectomy, 4. Longstanding DM 2 with severe microvascular disease 5. 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 Anemia normocytic - no acute indication for PRBC's.  transfuse prn. 7. Constipation PRN per primary; please avoid fleet's enemas   Recent Labs  Lab 02/21/20 0355 02/22/20 0408 02/23/20 0356  NA 136 137 136  K 4.2 4.3 4.0  CL 97* 98 97*  CO2 28 27 28   GLUCOSE 154* 159* 129*  BUN 77* 77* 86*  CREATININE 6.25* 5.89* 6.26*  CALCIUM 8.6* 8.6* 8.7*  PHOS 5.3* 5.4* 5.7*   Recent Labs  Lab 02/17/20 0500 02/17/20 0500 02/18/20 0411 02/19/20 0420 02/19/20 0933 02/20/20 0302 02/21/20 0355 02/22/20 0408 02/23/20 0356  WBC 6.2   < > 7.4   < > 8.7   < > 10.4 9.4 10.6*  NEUTROABS 3.6  --  4.2  --  5.5  --   --   --   --   HGB 8.1*   < > 8.7*   < > 9.1*   < > 9.7* 8.8* 8.1*  HCT 26.6*   < > 28.3*   < > 29.3*   < > 30.3* 28.4* 26.3*  MCV 89.3   < > 89.6   < > 89.3   < > 87.1 87.4 88.9  PLT 123*   < > 131*   < > 139*   < > 133* 120* 111*   < > = values in this interval not displayed.     [name] 02/23/2020 8:37 AM

## 2020-02-23 NOTE — Plan of Care (Signed)
  Problem: Education: Goal: Knowledge of General Education information will improve Description: Including pain rating scale, medication(s)/side effects and non-pharmacologic comfort measures Outcome: Completed/Met   Problem: Clinical Measurements: Goal: Diagnostic test results will improve Outcome: Completed/Met   Problem: Nutrition: Goal: Adequate nutrition will be maintained Outcome: Completed/Met   Problem: Coping: Goal: Level of anxiety will decrease Outcome: Completed/Met   Problem: Elimination: Goal: Will not experience complications related to bowel motility Outcome: Completed/Met

## 2020-02-23 NOTE — Plan of Care (Signed)
  Problem: Clinical Measurements: Goal: Will remain free from infection Outcome: Adequate for Discharge

## 2020-02-23 NOTE — Progress Notes (Signed)
PROGRESS NOTE    [name] [name]  VPX:[id number] DOB: [birth year] DOA: 02/18/2020 PCP: Ann Held, DO   No chief complaint on file.   Brief Narrative:  Patient is transferred from inpatient rehab.  See progress notes as well dated 02/18/2020 from hospitalist, nephrology, and PM&R.  Briefly, patient is [name] [name] 64 year old Caucasian female with PMH CKD3, DMII, PTSD, neuropathy, hypertension, hyperlipidemia, GERD, fatty liver, cirrhosis, asthma, anemia who was recently hospitalized from 01/20/2020 until 01/29/2020, and was then discharged to inpatient rehab.    She was hospitalized for osteomyelitis of L1 and underwent anterolateral corpectomy with PSIF on 01/22/2020 by neurosurgery.  Tissue cultures grew Pseudomonas and she was also followed by ID.  She had initially been on cefepime which was to be completed on 03/05/2020 for Azia Toutant total of 6 weeks of therapy.    She started developing progressive acute on chronic renal failure and was transitioned to meropenem on 4/17 to finish completing her course.  She was also followed by nephrology as well.  She has had adequate urine output and has also remained on twice daily IV Lasix.  She has not met criteria for initiating dialysis at this time.  Suspicion is for AIN due to cefepime.  She had also been on Kevaughn Ewing heparin drip for bilateral lower extremity DVT which was diagnosed on 01/30/2020.  Also, she has had Ace Bergfeld downtrending hemoglobin and has been evaluated by GI (positive hemoccult).  She underwent EGD on 02/18/2020 (she had been declining colonoscopy), her EGD report was unable to be reviewed at time of this note.   She was transferred for ongoing work-up and management by nephrology, hospitalist, GI.  Assessment & Plan:   Active Problems:   AKI (acute kidney injury) ([place])  AKI on CKD-3A with mild azotemia: History of renal transplant?  Baseline Cr 1.3-1.5>> Creatinine worsened today to 6.26.  Unclear etiology but concern about possible interstitial  nephritis from cefepime.  Also concern about obstructive etiology given acute urinary retention but no hydronephrosis on renal ultrasound. -Nephrology managing- holding lasix.  Gentle IVF.  Continue to monitor. -Cefepime discontinued.  Started on meropenem on 4/17 -Continue indwelling Foley catheter   Fluid overload: Likely due to renal failure and IV fluids.  Developed some respiratory distress and wheezing on 4/17.  BNP marginally elevated.  CXR with pulmonary congestion.  No history of CHF.  Echo in 2017 with EF of 55 to 60%.  Respiratory symptoms resolved.  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  Patient underwent anterolateral corpectomy with PSIF for osteomyelitison 01/22/2020 by neurosurgery and discharged to CIR on 4/1 on cefepime 5/7.  CRP and ESR elevated.  Imaging including CT [place]ve/P, MRI T/L without contrast nonrevealing.  No leukocytosis or fever. -Now switched to meropenem in the setting of AKI.  Will continue antibiotic through 5/7. -persistent back pain since surgery, ctm with current pain regimen -Continue PT/OT  Acute urinary retention: Could be due to medications with anticholinergic side effects such as opiates, diazepam, Atarax... but difficult situation due to her uncontrolled pain.  MRI of thoracic and lumbar spine without acute significant finding - "conus and cauda equina appear normal". -Continue indwelling Foley catheter -Started low-dose bethanechol.  Normocytic anemia: Baseline Hgb 11-13 before previous admission.  Recently 7-9. Hemoccult positive.  S/p 3 units pRBC. On IV heparin for DVT   Labs suggstive of AOCD, b12 low normal -  follow MMA -Appreciate GI help-PPI, and EGD on 4/21 (with mild gastritis, no esophageal or fundal varices).  Patient declined colonoscopy.  Follow pathology -> small bowel bx negative for increased  intraepithelial lymphocytes or villous architectural changes - gastric antral mucosa with chronic gastritis and reactive change, negative stain for h pylori - esophageal squamous and cardiac mucosa with mild chronic nonspecific cardioesophagitis, negative for intestinal metaplasia or dysplasia .  -Continue monitoring - will continue heparin gtt for now - consider IVC filter if recurrent issus  Thrombocytopenia: Relatively stable.  Due to liver cirrhosis?  Relatively stable, mild today. -Continue monitoring  Uncontrolled DM-2 with CKD-3A- most recent A1c 12.5%. -Reduce SSI to renal.  -Continue statin.    Recent bilateral lower extremity DVT-diagnosed on 4/2.  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  She is nonimmune to hepatitis B  Chronic left heel ulcer: No signs of infection -Appreciate wound care  OSA? -Would benefit from outpatient sleep study  Anxiety/depression/insomnia-could be playing Jader Desai role in her uncontrolled pain -On Cymbalta, trazodone, Valium and Atarax.  DVT prophylaxis: heparin gtt Code Status: full Family Communication: none at bedside Disposition:  pending   Consultants:   TRH taking over for CIR today  Nephrology  GI  ID  Procedures:   EGD 4/21 anterolateral corpectomy with PSIF for osteomyelitison 01/22/2020 by neurosurgery   Antimicrobials:  Anti-infectives (From admission, onward)   Start     Dose/Rate Route Frequency Ordered Stop   02/18/20 2330  meropenem (MERREM) 1 g in sodium chloride 0.9 % 100 mL IVPB     1 g 200 mL/hr over 30 Minutes Intravenous Every 24 hours 02/18/20 2241       Subjective: Working with therapy Pain better today  Objective: Vitals:   02/22/20 1701 02/22/20 2100 02/23/20 0510 02/23/20 0823  BP: (!) 93/55 119/63 120/63 123/64  Pulse: 74 73 81 79  Resp: 18 18 16 18   Temp: 99 F (37.2 C) 98.4 F (36.9 C) 97.6 F  (36.4 C) 97.8 F (36.6 C)  TempSrc: Oral   Oral  SpO2: 94% 93% 95% 98%  Weight:      Height:        Intake/Output Summary (Last 24 hours) at 02/23/2020 1147 Last data filed at 02/23/2020 0600 Gross per 24 hour  Intake 854.92 ml  Output 550 ml  Net 304.92 ml   Filed Weights   02/19/20 2200 02/20/20 2054 02/21/20 2123  Weight: 84.4 kg 83.2 kg 83.2 kg    Examination:  General: No acute distress. Cardiovascular: Heart sounds show Izacc Demeyer regular rate, and rhythm.  Lungs: Clear to auscultation bilaterally Abdomen: Soft, nontender, nondistended Neurological: Alert and oriented 3. Moves all extremities 4 . Cranial nerves II through XII grossly intact. Skin: Warm and dry. No rashes or lesions. Extremities: No clubbing or cyanosis. No edema.    Data Reviewed: I have personally reviewed following labs and imaging studies  CBC: Recent Labs  Lab 02/17/20 0500 02/17/20 0500 02/18/20 0411 02/19/20 0420 02/19/20 0933 02/20/20 0302 02/21/20 0355 02/22/20 0408 02/23/20 0356  WBC 6.2   < > 7.4   < > 8.7 9.0 10.4 9.4 10.6*  NEUTROABS 3.6  --  4.2  --  5.5  --   --   --   --   HGB 8.1*   < > 8.7*   < > 9.1* 9.0* 9.7* 8.8* 8.1*  HCT 26.6*   < >  28.3*   < > 29.3* 29.1* 30.3* 28.4* 26.3*  MCV 89.3   < > 89.6   < > 89.3 88.2 87.1 87.4 88.9  PLT 123*   < > 131*   < > 139* 136* 133* 120* 111*   < > = values in this interval not displayed.    Basic Metabolic Panel: Recent Labs  Lab 02/19/20 0933 02/20/20 0302 02/21/20 0355 02/22/20 0408 02/23/20 0356  NA 139 138 136 137 136  K 5.2* 4.5 4.2 4.3 4.0  CL 105 101 97* 98 97*  CO2 23 25 28 27 28   GLUCOSE 106* 103* 154* 159* 129*  BUN 72* 74* 77* 77* 86*  CREATININE 6.82* 6.56* 6.25* 5.89* 6.26*  CALCIUM 8.6* 8.5* 8.6* 8.6* 8.7*  MG 2.3 2.3 2.3 2.2 2.3  PHOS 4.5 4.9* 5.3* 5.4* 5.7*    GFR: Estimated Creatinine Clearance (by C-G formula based on SCr of 6.26 mg/dL (H)) Female: 8.9 mL/min (Viral Schramm) Female: 10.9 mL/min (Apryl Brymer)  Liver Function  Tests: Recent Labs  Lab 02/19/20 0933 02/20/20 0302 02/21/20 0355 02/22/20 0408 02/23/20 0356  AST 17 16 16 17 20   ALT 9 11 9 9 13   ALKPHOS 112 103 103 90 86  BILITOT 0.2* 0.7 0.4 0.5 0.5  PROT 7.2 7.0 7.6 7.1 7.1  ALBUMIN 1.9* 2.0* 2.1* 2.0* 2.0*    CBG: Recent Labs  Lab 02/22/20 1117 02/22/20 1700 02/22/20 2102 02/23/20 0659 02/23/20 1114  GLUCAP 275* 110* 162* 100* 134*     No results found for this or any previous visit (from the past 240 hour(s)).       Radiology Studies: No results found.      Scheduled Meds: . atorvastatin  10 mg Oral Daily  . bethanechol  10 mg Oral TID  . Chlorhexidine Gluconate Cloth  6 each Topical Daily  . insulin aspart  0-9 Units Subcutaneous TID AC & HS  . lidocaine  1 patch Transdermal Q24H  . multivitamin with minerals  1 tablet Oral Daily  . pantoprazole  40 mg Oral Daily  . polyethylene glycol  17 g Oral BID  . pregabalin  75 mg Oral QHS  . psyllium  1 packet Oral Daily  . senna-docusate  2 tablet Oral QHS  . sodium chloride flush  10-40 mL Intracatheter Q12H  . traZODone  50 mg Oral QHS   Continuous Infusions: . sodium chloride 75 mL/hr at 02/23/20 0854  . heparin 1,350 Units/hr (02/22/20 2220)  . meropenem (MERREM) IV 1 g (02/22/20 2332)     LOS: 5 days    Time spent: over 23 min    Fayrene Helper, MD [facility]   To contact the attending provider between 7A-7P or the covering provider during after hours 7P-7A, please log into the web site [email or web address] and access using universal [place] password for that web site. If you do not have the password, please call the hospital operator.  02/23/2020, 11:47 AM

## 2020-02-23 NOTE — Progress Notes (Signed)
ANTICOAGULATION CONSULT NOTE - Follow Up Consult  Pharmacy Consult for Heparin Indication: DVT  Allergies  Allergen Reactions  . Bee Pollen Anaphylaxis  . Fish-Derived Products Hives, Shortness Of Breath, Swelling and Rash    Hives get in throat causing trouble breathing  . Mushroom Extract Complex Anaphylaxis  . Penicillins Anaphylaxis    **Tolerated cefepime March 2021 Did it involve swelling of the face/tongue/throat, SOB, or low BP? Yes Did it involve sudden or severe rash/hives, skin peeling, or any reaction on the inside of your mouth or nose? No Did you need to seek medical attention at a hospital or doctor's office? Yes When did it last happen?A few months ago If all above answers are "NO", may proceed with cephalosporin use.  [name] Kitchen Rosemary Oil Anaphylaxis  . Shellfish Allergy Hives, Shortness Of Breath, Swelling and Rash  . Tomato Hives and Shortness Of Breath    Hives in throat causes her trouble breathing  . Acetaminophen Other (See Comments)    GI upset  . Acyclovir And Related Other (See Comments)    Unknown rxn  . Aloe Vera Hives  . Broccoli [Brassica Oleracea] Hives  . Naproxen Other (See Comments)    Unknown rxn    Patient Measurements: Height: 5' 1"  (154.9 cm)(from 01/20/20 records) Weight: 83.2 kg (183 lb 7 oz) IBW/kg (Calculated) : 47.8 Heparin Dosing Weight:    Vital Signs: Temp: 97.6 F (36.4 C) (04/26 0510) BP: 120/63 (04/26 0510) Pulse Rate: 81 (04/26 0510)  Labs: Recent Labs    02/21/20 0355 02/21/20 0355 02/22/20 0408 02/23/20 0356  HGB 9.7*   < > 8.8* 8.1*  HCT 30.3*  --  28.4* 26.3*  PLT 133*  --  120* 111*  HEPARINUNFRC 0.34  --  0.39 0.37  CREATININE 6.25*  --  5.89* 6.26*   < > = values in this interval not displayed.    Estimated Creatinine Clearance (by C-G formula based on SCr of 6.26 mg/dL (H)) Female: 8.9 mL/min (A) Female: 10.9 mL/min (A)   Assessment:  Anticoag: lovenox 40>>Xarelto for new DVT. Changed to IV heparin  for now due to anemia. Prob not HIT, 4T score 3. Transfused 4/17. Xarelto 50m BID started 4/2>heparin 4/14 - 4/2: LE dopplers - right/left acute DVT   - HL 0.37 in goal. Hgb 8.1 relatively stable., Plts 111 down.  Goal of Therapy:  Heparin level 0.3-0.7 units/ml Monitor platelets by anticoagulation protocol: Yes   Plan:  Con't IV heparin to 1350 units/hr Daily HL and CBC    Dorwin Fitzhenry A. PLevada Dy PharmD, BCPS, FNKF Clinical Pharmacist [place] Please utilize Amion for appropriate phone number to reach the unit pharmacist (MReid   02/23/2020,7:59 AM

## 2020-02-23 NOTE — Plan of Care (Signed)
  Problem: Education: Goal: Knowledge of General Education information will improve Description: Including pain rating scale, medication(s)/side effects and non-pharmacologic comfort measures Outcome: Progressing   Problem: Activity: Goal: Risk for activity intolerance will decrease Outcome: Progressing   

## 2020-02-23 NOTE — Progress Notes (Signed)
Physical Therapy Treatment Patient Details Name: [name] [name] MRN: [id number] DOB: 07/30/1956 Today's Date: 02/23/2020    History of Present Illness 64 year old Caucasian female with PMH CKD3, DMII, PTSD, neuropathy, hypertension, hyperlipidemia, GERD, fatty liver, cirrhosis, asthma, anemia who was recently hospitalized from 01/20/2020 until 01/29/2020, and was then discharged to inpatient rehab. She was hospitalized for osteomyelitis of L1 and underwent anterolateral corpectomy with PSIF on 01/22/2020 by neurosurgery. started developing progressive acute on chronic renal failure, transferred back to acute Care on 4/21    PT Comments    Continuing work on functional mobility and activity tolerance;  Session focused on functional transfers and discerning optimal ways for staff to assist Ms. Depaula with transfers; Notable improvement in bed mobility and sit to stand transfers; Used the stedy to help with sit to stand and transfer to [facility] very well (had a small BM on BSC);   I'm encouraged with her progress today; Hopeful to do more walking next session; At this point, I'm still recommending CIR to complete her rehab -- She is wondering about being able to dc home, and given today's progress, that is not unreasonable; will monitor progress, and update dc recs as appropriate  Follow Up Recommendations  CIR;Other (comment)(Will monitor for progress; May progress well enough to dc home)     Equipment Recommendations  Rolling walker with 5" wheels;3in1 (PT)    Recommendations for Other Services  OT -- will order per protocol     Precautions / Restrictions Precautions Precautions: Back;Fall;Other (comment)(has had issues w/orthostatic hypotension) Precaution Comments: Reviewed Back Precautions Required Braces or Orthoses: Spinal Brace Spinal Brace: Thoracolumbosacral orthotic;Applied in supine position    Mobility  Bed Mobility Overal bed mobility: Needs Assistance Bed Mobility:  Rolling;Sidelying to Sit Rolling: Min assist Sidelying to sit: Min assist       General bed mobility comments: Very nice technqiue today; less assist needed to push u pto sit  Transfers Overall transfer level: Needs assistance   Transfers: Sit to/from Stand Sit to Stand: Min assist;Min guard         General transfer comment: Stood 4 times, min assist from bed to stedy, then minguard to stand from high steady seat; cues for hand placement  Ambulation/Gait                 Stairs             Wheelchair Mobility    Modified Rankin (Stroke Patients Only)       Balance                                            Cognition Arousal/Alertness: Awake/alert Behavior During Therapy: WFL for tasks assessed/performed Overall Cognitive Status: Within Functional Limits for tasks assessed                                        Exercises      General Comments        Pertinent Vitals/Pain Pain Assessment: Faces Faces Pain Scale: Hurts little more Pain Location: lumbar region; and discomfort moving bowels Pain Descriptors / Indicators: Aching;Discomfort;Sore Pain Intervention(s): Monitored during session    Home Living  Prior Function            PT Goals (current goals can now be found in the care plan section) Acute Rehab PT Goals Patient Stated Goal: to have less pain; is wondering about CIR versus going home PT Goal Formulation: With patient Time For Goal Achievement: 03/07/20 Potential to Achieve Goals: Good Progress towards PT goals: Progressing toward goals    Frequency    Min 3X/week      PT Plan Current plan remains appropriate    Co-evaluation              AM-PAC PT "6 Clicks" Mobility   Outcome Measure  Help needed turning from your back to your side while in a flat bed without using bedrails?: A Little Help needed moving from lying on your back to sitting on  the side of a flat bed without using bedrails?: A Little Help needed moving to and from a bed to a chair (including a wheelchair)?: A Lot Help needed standing up from a chair using your arms (e.g., wheelchair or bedside chair)?: A Little Help needed to walk in hospital room?: A Lot Help needed climbing 3-5 steps with a railing? : Total 6 Click Score: 14    End of Session Equipment Utilized During Treatment: Back brace;Gait belt Activity Tolerance: Patient tolerated treatment well Patient left: in chair;with call bell/phone within reach;with chair alarm set Nurse Communication: Mobility status;Need for lift equipment;Other (comment)(Good use of Stedy) PT Visit Diagnosis: Muscle weakness (generalized) (M62.81);Other abnormalities of gait and mobility (R26.89);Pain Pain - Right/Left: (Back) Pain - part of body: (Back)     Time: 1020-1055 PT Time Calculation (min) (ACUTE ONLY): 35 min  Charges:  $Therapeutic Activity: 23-37 mins                     Roney Marion, PT  Acute Rehabilitation Services Pager [phone] Office [phone]    Colletta [place] 02/23/2020, 3:40 PM

## 2020-02-23 NOTE — TOC Initial Note (Signed)
Transition of Care [facility]) - Initial/Assessment Note    Patient Details  Name: [name] [name] MRN: [id number] Date of Birth: 1955-11-10  [facility] CM/SW Contact:    Pollie Friar, RN Phone Number: 02/23/2020, 3:49 PM  Clinical Narrative:                 CM received a phone call from Waldron Session with [facility]. She is the RN over the home aides. Pt's spouse has contacted them to arrange 24 hour care in the home. Lattie Haw will need to meet with the patient and learn how to don her brace prior to her d/c. She would like this to happen about 2-4 days prior to her d/c home.  If pt d/c home with IV abx pt states she and spouse have done this before.  Pt is interested in [facility] services but has no preference of who she wants to use.  NO issues with transportation or home medications.  TOC following.   Expected Discharge Plan: [place] Barriers to Discharge: Continued Medical Work up   Patient Goals and CMS Choice   CMS Medicare.gov Compare Post Acute Care list provided to:: Patient Choice offered to / list presented to : Patient, Spouse  Expected Discharge Plan and Services Expected Discharge Plan: [place]   Discharge Planning Services: CM Consult Post Acute Care Choice: [place] arrangements for the past 2 months: Apartment                                      Prior Living Arrangements/Services Living arrangements for the past 2 months: Apartment Lives with:: Spouse Patient language and need for interpreter reviewed:: Yes Do you feel safe going back to the place where you live?: Yes      Need for Family Participation in Patient Care: Yes (Comment) Care giver support system in place?: Yes (comment)(spouse has arranged 24 hour caregivers) Current home services: DME(wheelchair/ scooter/ walker/ 3 in 1/ shower seat/ over the bed table) Criminal Activity/Legal Involvement Pertinent to Current Situation/Hospitalization:  No - Comment as needed  Activities of Daily Living      Permission Sought/Granted                  Emotional Assessment Appearance:: Appears stated age Attitude/Demeanor/Rapport: Engaged Affect (typically observed): Accepting Orientation: : Oriented to Self, Oriented to Place, Oriented to  Time, Oriented to Situation   Psych Involvement: No (comment)  Admission diagnosis:  AKI (acute kidney injury) ([place]) [N17.9] Patient Active Problem List   Diagnosis Date Noted  . Spinal stenosis of lumbar region 01/22/2020  . Osteomyelitis of vertebra of thoracolumbar region ([place]) 01/21/2020  . Paraspinal mass 01/20/2020  . Back pain 11/17/2019  . Compression fracture of L1 lumbar vertebra ([place]) 11/17/2019  . Fracture of multiple ribs 11/17/2019  . Intractable nausea and vomiting 11/16/2019  . AKI (acute kidney injury) ([place]) 11/15/2019  . Nausea & vomiting 11/15/2019  . Pressure injury of skin 04/17/2019  . Septic arthritis of elbow, right ([place]) 04/17/2019  . 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 Sleep apnea 11/16/2017  . Diabetic foot infection ([place]) 04/06/2017  . Type 2 diabetes mellitus with hyperglycemia, with long-term current use of insulin ([place]) 04/06/2017  . Ulcer of left heel, limited to breakdown of skin ([place]) 02/01/2017  . Neuropathy 05/05/2015  . Diabetic  peripheral neuropathy ([place]) 01/12/2015  . CKD (chronic kidney disease), stage III ([place]) 05/27/2014  . History of MI (myocardial infarction) 10/06/2013  . Nausea and vomiting 10/06/2013  . Obesity (BMI 30-39.9) 09/20/2013  . 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 Normocytic anemia 02/03/2013  . CAD (coronary artery disease) 01/11/2012  . Hepatic cirrhosis ([place]) 07/06/2010  . 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  . Hyperlipidemia 06/03/2008  . Essential hypertension 12/23/2006   PCP:  Ann Held, DO Pharmacy:   [facility] [address] [place], [place] - [address] [place] [address] Phone: [phone] Fax: [id number] - [place], [place] - [address] [place] [address] Phone: [phone] Fax: [place], [place] [address] [place] [address] Phone: [phone] Fax: [place], [place] [address] [place] [address] Phone: [phone] Fax: [phone]     Social Determinants of Health (SDOH) Interventions    Readmission Risk Interventions Readmission Risk Prevention Plan 01/23/2020 11/18/2019  Transportation Screening Complete Complete  Medication Review (RN Care Manager) Complete Referral to Pharmacy  PCP or Specialist appointment within 3-5 days of discharge Complete Complete  HRI or Home Care Consult Complete Complete  SW Recovery Care/Counseling Consult Complete Complete  Palliative Care Screening Complete Not [place] Complete Not Applicable  Some recent data might be hidden

## 2020-02-24 ENCOUNTER — Telehealth: Payer: Self-pay

## 2020-02-24 LAB — HEMOGLOBIN AND HEMATOCRIT, BLOOD
HCT: 24.4 % — ABNORMAL LOW (ref 36.0–46.0)
HCT: 24.3 % — ABNORMAL LOW (ref 36.0–46.0)
Hemoglobin: 7.5 g/dL — ABNORMAL LOW (ref 12.0–15.0)
Hemoglobin: 7.5 g/dL — ABNORMAL LOW (ref 12.0–15.0)

## 2020-02-24 LAB — COMPREHENSIVE METABOLIC PANEL
ALT: 12 U/L (ref 0–44)
AST: 22 U/L (ref 15–41)
Albumin: 1.9 g/dL — ABNORMAL LOW (ref 3.5–5.0)
Alkaline Phosphatase: 82 U/L (ref 38–126)
Anion gap: 13 (ref 5–15)
BUN: 93 mg/dL — ABNORMAL HIGH (ref 8–23)
CO2: 24 mmol/L (ref 22–32)
Calcium: 8.3 mg/dL — ABNORMAL LOW (ref 8.9–10.3)
Chloride: 97 mmol/L — ABNORMAL LOW (ref 98–111)
Creatinine, Ser: 7.03 mg/dL — ABNORMAL HIGH (ref 0.44–1.00)
GFR calc Af Amer: 7 mL/min — ABNORMAL LOW (ref >60)
GFR calc non Af Amer: 6 mL/min — ABNORMAL LOW (ref >60)
Glucose, Bld: 120 mg/dL — ABNORMAL HIGH (ref 70–99)
Potassium: 4 mmol/L (ref 3.5–5.1)
Sodium: 134 mmol/L — ABNORMAL LOW (ref 135–145)
Total Bilirubin: 0.5 mg/dL (ref 0.3–1.2)
Total Protein: 6.4 g/dL — ABNORMAL LOW (ref 6.5–8.1)

## 2020-02-24 LAB — CBC
HCT: 24 % — ABNORMAL LOW (ref 36.0–46.0)
Hemoglobin: 7.3 g/dL — ABNORMAL LOW (ref 12.0–15.0)
MCH: 27.3 pg (ref 26.0–34.0)
MCHC: 30.4 g/dL (ref 30.0–36.0)
MCV: 89.9 fL (ref 80.0–100.0)
Platelets: 106 10*3/uL — ABNORMAL LOW (ref 150–400)
RBC: 2.67 MIL/uL — ABNORMAL LOW (ref 3.87–5.11)
RDW: 17.1 % — ABNORMAL HIGH (ref 11.5–15.5)
WBC: 9.6 10*3/uL (ref 4.0–10.5)
nRBC: 0 % (ref 0.0–0.2)

## 2020-02-24 LAB — HEPARIN LEVEL (UNFRACTIONATED): Heparin Unfractionated: 0.42 IU/mL (ref 0.30–0.70)

## 2020-02-24 LAB — GLUCOSE, CAPILLARY
Glucose-Capillary: 107 mg/dL — ABNORMAL HIGH (ref 70–99)
Glucose-Capillary: 103 mg/dL — ABNORMAL HIGH (ref 70–99)
Glucose-Capillary: 102 mg/dL — ABNORMAL HIGH (ref 70–99)
Glucose-Capillary: 148 mg/dL — ABNORMAL HIGH (ref 70–99)

## 2020-02-24 LAB — MAGNESIUM: Magnesium: 2.3 mg/dL (ref 1.7–2.4)

## 2020-02-24 LAB — PHOSPHORUS: Phosphorus: 6 mg/dL — ABNORMAL HIGH (ref 2.5–4.6)

## 2020-02-24 MED ORDER — FAMOTIDINE 20 MG PO TABS: 20.0000 mg | ORAL_TABLET | Freq: Every day | ORAL | Status: DC

## 2020-02-24 MED ORDER — TRAZODONE HCL 100 MG PO TABS
100.0000 mg | ORAL_TABLET | Freq: Every day | ORAL | Status: DC
  Administered 2020-02-24 – 2020-02-26 (×2): 100 mg via ORAL
  Filled 2020-02-24 (×2): qty 1

## 2020-02-24 MED ORDER — PANTOPRAZOLE SODIUM 40 MG PO TBEC
40.0000 mg | DELAYED_RELEASE_TABLET | Freq: Every day | ORAL | Status: DC
  Administered 2020-02-24 – 2020-02-26 (×3): 40 mg via ORAL
  Filled 2020-02-24 (×2): qty 1

## 2020-02-24 NOTE — Progress Notes (Signed)
OT Cancellation Note  Patient Details Name: [name] [name] MRN: [id number] DOB: 01-Dec-1955   Cancelled Treatment:    Reason Eval/Treat Not Completed: Patient declined, no reason specified Attempted OT eval x 2 this AM with pt reporting "not doing therapy today", as she believes she is dying after MD reports possible plan to start HD. Time spent educating pt on therapy, DC goals for pt, and providing information from nephrology notes. Pt continued to decline therapy eval. Will re-attempt tomorrow.   Layla Maw 02/24/2020, 10:56 AM

## 2020-02-24 NOTE — Progress Notes (Signed)
Inpatient Rehab Admissions:  Inpatient Rehab Consult received. I am familiar with this patient as she was admitted to [facility] 4/1-4/21 prior to being transferred back to acute due to acute medical concerns. I met with pt bedside this AM to discuss an interest in return to CIR once more medically stable. Pt states "I'm going home to [place]." Pt relayed feeling that her condition is worsening and she would rather spend time at home with family. At this time, pt does not want to return to CIR. Spoke with RN regarding the patient's statements and current understanding of her medical workup so far. RN aware.  AC will follow at a distance in case pt changes her mind.   Raechel Ache, OTR/L  Rehab Admissions Coordinator  [phone] 02/24/2020 10:27 AM

## 2020-02-24 NOTE — Progress Notes (Signed)
PROGRESS NOTE    [name] [name]  GEX:[id number] DOB: [birth year] DOA: 02/18/2020 PCP: Ann Held, DO   No chief complaint on file.   Brief Narrative:  Patient is transferred from inpatient rehab.  See progress notes as well dated 02/18/2020 from hospitalist, nephrology, and PM&R.  Briefly, patient is [name] [name] 64 year old Caucasian female with PMH CKD3, DMII, PTSD, neuropathy, hypertension, hyperlipidemia, GERD, fatty liver, cirrhosis, asthma, anemia who was recently hospitalized from 01/20/2020 until 01/29/2020, and was then discharged to inpatient rehab.    She was hospitalized for osteomyelitis of L1 and underwent anterolateral corpectomy with PSIF on 01/22/2020 by neurosurgery.  Tissue cultures grew Pseudomonas and she was also followed by ID.  She had initially been on cefepime which was to be completed on 03/05/2020 for [name] [name] total of 6 weeks of therapy.    She started developing progressive acute on chronic renal failure and was transitioned to meropenem on 4/17 to finish completing her course.  She was also followed by nephrology as well.  She has had adequate urine output and has also remained on twice daily IV Lasix.  She has not met criteria for initiating dialysis at this time.  Suspicion is for AIN due to cefepime.  She had also been on Malvin Morrish heparin drip for bilateral lower extremity DVT which was diagnosed on 01/30/2020.  Also, she has had Jamaree Hosier downtrending hemoglobin and has been evaluated by GI (positive hemoccult).  She underwent EGD on 02/18/2020 (she had been declining colonoscopy).  Her EGD showed mild gastritis.  Her creatinine has recently worsened.  Renal planning for possible dialysis catheter on 4/28.  Her Hb is slowly downtrending, but no signs of bleeding at this time.  May need transfusion.  Assessment & Plan:   Active Problems:   AKI (acute kidney injury) ([place])  Depressed Mood  Goals of Care: depressed mood today 4/27.  She's frustrated with her long hospital stay and  wants to get home as soon as possible.  It seemed that she thought she was dying after [name] [name] discussion about starting dialysis this AM.  Tried to clarify that at this point, we think that she may need to start dialysis soon, which does not mean we think she's dying.  She notes that she's frustrated with being here and now her goal is to get home.  Encouraged her that for that to happen safely, it's important that her current medical issues are addressed.  She noted concern with the way some staff had treated her (passed this along to nursing).  Hopefully this helps her feel more comfortable with continued hospitalization.   Will continue to follow her mood and address issues as needed regarding her comfort with hospitalization as well as her understanding of her disease processes  AKI on CKD-3A with mild azotemia: History of renal transplant (per [facility] notes, "she reports that she received [name] [name] kidney transplant [name] [name] [name] [name] young age, but there is no physical or radiological evidence of this assertion")...  Baseline Cr 1.3-1.5>> Creatinine worsened today to 7.  Unclear etiology but concern about possible interstitial nephritis from cefepime.  Also concern about obstructive etiology given acute urinary retention but no hydronephrosis on renal ultrasound. -Nephrology managing- holding lasix/fluids at this time. Continue to monitor. - Renal planning for possible dialysis catheter on 4/28 - follow ANCA - pt may need renal bx, per renal, please do not start oral anticoagulation in the event that [name] [name] biopsy is needed -Cefepime discontinued.  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  Patient underwent anterolateral corpectomy with PSIF for osteomyelitison 01/22/2020 by neurosurgery and discharged to CIR on 4/1 on cefepime 5/7.  CRP and ESR elevated.  Imaging  including CT Dilon Lank/P, MRI T/L without contrast nonrevealing.  No leukocytosis or fever. -Now switched to meropenem in the setting of AKI.  Will continue antibiotic through 5/7. -persistent back pain since surgery, ctm with current pain regimen -Continue PT/OT  Normocytic anemia: Baseline Hgb 11-13 before previous admission.  Recently 7-9. Hemoccult positive.  S/p 3 units pRBC. Hb slowly downtrending over last few days -> no obvious signs of overt bleeding - follow PM Hb Labs suggstive of AOCD, b12 low normal - follow MMA (pending) -Appreciate GI help-PPI, and EGD on 4/21 (with mild gastritis, no esophageal or fundal varices).  Patient declined colonoscopy.  Follow pathology -> small bowel bx negative for increased intraepithelial lymphocytes or villous architectural changes - gastric antral mucosa with chronic gastritis and reactive change, negative stain for h pylori - esophageal squamous and cardiac mucosa with mild chronic nonspecific cardioesophagitis, negative for intestinal metaplasia or dysplasia .  -Continue monitoring - will continue heparin gtt for now - consider IVC filter if recurrent issues with anemia - transfuse for Hb < 7   Acute urinary retention: Could be due to medications with anticholinergic side effects such as opiates, diazepam, Atarax... but difficult situation due to her uncontrolled pain.  MRI of thoracic and lumbar spine without acute significant finding - "conus and cauda equina appear normal". -Continue indwelling Foley catheter -Started low-dose bethanechol.  Fluid overload: Likely due to renal failure and IV fluids.  Developed some respiratory distress and wheezing on 4/17.  BNP marginally elevated.  CXR with pulmonary congestion.  No history of CHF.  Echo in 2017 with EF of 55 to 60%.  Respiratory symptoms resolved.  -IV Lasix on hold as noted above  Thrombocytopenia:  Gradual downtrend.  Noted first on 4/10.  Due to liver cirrhosis?   -Continue  monitoring  Uncontrolled DM-2 with CKD-3A- most recent A1c 12.5%. -Reduce SSI to renal.  -Continue statin.    Recent bilateral lower extremity DVT-diagnosed on 4/2.  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  She is nonimmune to hepatitis B  Chronic left heel ulcer: No signs of infection -Appreciate wound care  OSA? -Would benefit from outpatient sleep study  Anxiety/depression/insomnia-could be playing Aubry Rankin role in her uncontrolled pain -On Cymbalta, trazodone, Valium and Atarax.  DVT prophylaxis: heparin gtt Code Status: full Family Communication: none at bedside Disposition:  pending   Consultants:   [facility] taking over for CIR today  Nephrology  GI  ID  Procedures:   EGD 4/21 anterolateral corpectomy with PSIF for osteomyelitison 01/22/2020 by neurosurgery   Antimicrobials:  Anti-infectives (From admission, onward)   Start     Dose/Rate Route Frequency Ordered Stop   02/18/20 2330  meropenem (MERREM) 1 g in sodium chloride 0.9 % 100 mL IVPB     1 g 200 mL/hr over 30 Minutes Intravenous Every 24 hours 02/18/20 2241       Subjective: Thinks she's dying Wants to go home  Objective: Vitals:   02/24/20 0900 02/24/20 1345 02/24/20 1500 02/24/20 1506  BP: (!) 102/48 (!) 78/46 (!) 94/46 (!) 94/46  Pulse: 75 75 75   Resp: 18  17    Temp: (!) 97.4 F (36.3 C) 97.7 F (36.5 C)    TempSrc: Oral Oral    SpO2: 96% 97%    Weight:      Height:        Intake/Output Summary (Last 24 hours) at 02/24/2020 1755 Last data filed at 02/24/2020 0600 Gross per 24 hour  Intake 1667.42 ml  Output 600 ml  Net 1067.42 ml   Filed Weights   02/19/20 2200 02/20/20 2054 02/21/20 2123  Weight: 84.4 kg 83.2 kg 83.2 kg    Examination:  General: No acute distress. Cardiovascular: Heart sounds show Vanita Cannell regular rate, and rhythm Lungs: Clear to auscultation bilaterally  Abdomen: Soft,  nontender, nondistended  Neurological: Alert and oriented 3. Moves all extremities 4 . Cranial nerves II through XII grossly intact. Skin: Warm and dry. No rashes or lesions. Extremities: No clubbing or cyanosis. No edema.   Data Reviewed: I have personally reviewed following labs and imaging studies  CBC: Recent Labs  Lab 02/18/20 0411 02/19/20 0420 02/19/20 0933 02/19/20 0933 02/20/20 0302 02/20/20 0302 02/21/20 0355 02/21/20 0355 02/22/20 0408 02/23/20 0356 02/23/20 1510 02/24/20 0425 02/24/20 0746  WBC 7.4   < > 8.7   < > 9.0  --  10.4  --  9.4 10.6*  --  9.6  --   NEUTROABS 4.2  --  5.5  --   --   --   --   --   --   --   --   --   --   HGB 8.7*   < > 9.1*   < > 9.0*   < > 9.7*   < > 8.8* 8.1* 8.2* 7.3* 7.5*  HCT 28.3*   < > 29.3*   < > 29.1*   < > 30.3*   < > 28.4* 26.3* 26.3* 24.0* 24.4*  MCV 89.6   < > 89.3   < > 88.2  --  87.1  --  87.4 88.9  --  89.9  --   PLT 131*   < > 139*   < > 136*  --  133*  --  120* 111*  --  106*  --    < > = values in this interval not displayed.    Basic Metabolic Panel: Recent Labs  Lab 02/20/20 0302 02/21/20 0355 02/22/20 0408 02/23/20 0356 02/24/20 0425  NA 138 136 137 136 134*  K 4.5 4.2 4.3 4.0 4.0  CL 101 97* 98 97* 97*  CO2 25 28 27 28 24   GLUCOSE 103* 154* 159* 129* 120*  BUN 74* 77* 77* 86* 93*  CREATININE 6.56* 6.25* 5.89* 6.26* 7.03*  CALCIUM 8.5* 8.6* 8.6* 8.7* 8.3*  MG 2.3 2.3 2.2 2.3 2.3  PHOS 4.9* 5.3* 5.4* 5.7* 6.0*    GFR: Estimated Creatinine Clearance (by C-G formula based on SCr of 7.03 mg/dL (H)) Female: 7.9 mL/min (Latayna Ritchie) Female: 9.7 mL/min (Betheny Suchecki)  Liver Function Tests: Recent Labs  Lab 02/20/20 0302 02/21/20 0355 02/22/20 0408 02/23/20 0356 02/24/20 0425  AST 16 16 17 20 22   ALT 11 9 9 13 12   ALKPHOS 103 103 90 86 82  BILITOT 0.7 0.4 0.5 0.5 0.5  PROT 7.0 7.6 7.1 7.1 6.4*  ALBUMIN 2.0* 2.1* 2.0* 2.0* 1.9*    CBG: Recent Labs  Lab 02/23/20 1613 02/23/20 2017 02/24/20 0654 02/24/20 1108  02/24/20 1632  GLUCAP 173* 168* 107* 103* 102*     No results found for this or  any previous visit (from the past 240 hour(s)).       Radiology Studies: No results found.      Scheduled Meds: . atorvastatin  10 mg Oral Daily  . bethanechol  10 mg Oral TID  . Chlorhexidine Gluconate Cloth  6 each Topical Daily  . insulin aspart  0-9 Units Subcutaneous TID AC & HS  . lidocaine  1 patch Transdermal Q24H  . multivitamin with minerals  1 tablet Oral Daily  . pantoprazole  40 mg Oral Daily  . polyethylene glycol  17 g Oral BID  . pregabalin  75 mg Oral QHS  . psyllium  1 packet Oral Daily  . senna-docusate  2 tablet Oral QHS  . sodium chloride flush  10-40 mL Intracatheter Q12H  . traZODone  100 mg Oral QHS   Continuous Infusions: . heparin 1,350 Units/hr (02/24/20 1159)  . meropenem (MERREM) IV 1 g (02/23/20 2316)     LOS: 6 days    Time spent: over 30 min    Fayrene Helper, MD [facility]   To contact the attending provider between 7A-7P or the covering provider during after hours 7P-7A, please log into the web site [email or web address] and access using universal [place] password for that web site. If you do not have the password, please call the hospital operator.  02/24/2020, 5:55 PM

## 2020-02-24 NOTE — Telephone Encounter (Signed)
I called and spoke with Beverlee Nims today.  She remains hospitalized following surgery for lumbar infection.  She remains on IV meropenem.  She is upset about her worsening renal function and tells me that she wants to go home soon.  She is scheduled to continue meropenem through 03/05/2020.  And follow-up in clinic here on 03/04/2020 in case she is discharged prior to that time.

## 2020-02-24 NOTE — Progress Notes (Addendum)
Nephrology Progress Note:  Admit: 02/18/2020 LOS: 6  Subjective:  She had 600 mL UOP charted over 4/26.  S/p normal saline at 75/hr x 12 hours on 4/26.  Spoke with pt and she called her husband who joined via speakerphone.  Discussed risks, benefits, and indications of dialysis and she does consent to dialysis if needed.  She is willing to be NPO after midnight tonight in the event access is needed tomorrow.  She does consent to blood if needed and states that she has received multiple transfusions (she reports three) while here.   Review of systems:  Denies n/v Denies shortness of breath or chest pain  Several BM's yesterday Denies overt blood per rectum or dark stools  04/26 0701 - 04/27 0700 In: 2027.4 [P.O.:720; I.V.:1207.4; IV Piggyback:100] Out: 600 [Urine:600]  Filed Weights   02/19/20 2200 02/20/20 2054 02/21/20 2123  Weight: 84.4 kg 83.2 kg 83.2 kg    Scheduled Meds: . atorvastatin  10 mg Oral Daily  . bethanechol  10 mg Oral TID  . Chlorhexidine Gluconate Cloth  6 each Topical Daily  . insulin aspart  0-9 Units Subcutaneous TID AC & HS  . lidocaine  1 patch Transdermal Q24H  . multivitamin with minerals  1 tablet Oral Daily  . pantoprazole  40 mg Oral Daily  . polyethylene glycol  17 g Oral BID  . pregabalin  75 mg Oral QHS  . psyllium  1 packet Oral Daily  . senna-docusate  2 tablet Oral QHS  . sodium chloride flush  10-40 mL Intracatheter Q12H  . traZODone  50 mg Oral QHS   Continuous Infusions: . heparin 1,350 Units/hr (02/23/20 1709)  . meropenem (MERREM) IV 1 g (02/23/20 2316)   PRN Meds:.acetaminophen, ondansetron (ZOFRAN) IV, oxyCODONE, sodium chloride flush  Current Labs: reviewed   Results for TACHE, BOBST (MRN [id number]) as of 02/14/2020 12:15  Ref. Range 02/13/2020 16:30  Protein Creatinine Ratio Latest Ref Range: 0.00 - 0.15 mg/mgCre 2.73 (H)   C3 and C4 normal   Physical Exam:  Blood pressure 99/76, pulse 69, temperature (!) 97.4 F (36.3  C), temperature source Oral, resp. rate 20, height 5' 1"  (1.549 m), weight 83.2 kg, SpO2 96 %.   Chronically ill-appearing, lying in bed, NAD  Clear lungs and unlabored on room air at rest Regular, normal S1-S2 no rub Obese, mild distention, soft, nontender No lower extremity edema   Foley catheter in place Psych normal mood and affect  Neuro - provides hx and follows commands; alert  A 1. 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  Changed ABX for ? AIN.  Unsure if pre-renal component contributing though no improvement with stopping lasix and gently hydrating on 4/26.  Hx DM with likely reduced renal reserve - Anticipate unfortunately that she is progressing toward a need for HD  - NPO after midnight tonight for possible dialysis catheter on 4/28.   - Continue foley and supportive care - Given her infection would prefer to hold off on any steroids - defer fluids or lasix at this time - FOBT positive -had originally planned for famotidine but will leave PPI - Check ANCA   2. subnephrotic range proteinuria - hx uncontrolled DM with A1c of 12.5 in 10/2019.   3. Urinary retention - foley in place 4. ckd stage 3 - noted hx ckd stage 3 likely  2/2 DM which was uncontrolled 10/2019.  Cr 1.35 on 01/20/20 5. Pseudomonal discitis s/p cefepime (moving to meropenem, through 5/7) status post corpectomy, 6. Longstanding DM 2 with severe microvascular disease 7. 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 Anemia normocytic - she does agree to transfusion if needed and states has received this hospitalization.  Would provide PRBC's if drops further  9. Constipation PRN per primary; please avoid fleet's enemas   Recent Labs  Lab 02/22/20 0408 02/23/20 0356 02/24/20 0425  NA 137 136 134*  K 4.3 4.0 4.0  CL 98 97* 97*  CO2 27  28 24   GLUCOSE 159* 129* 120*  BUN 77* 86* 93*  CREATININE 5.89* 6.26* 7.03*  CALCIUM 8.6* 8.7* 8.3*  PHOS 5.4* 5.7* 6.0*   Recent Labs  Lab 02/18/20 0411 02/19/20 0420 02/19/20 0933 02/20/20 0302 02/22/20 0408 02/22/20 0408 02/23/20 0356 02/23/20 1510 02/24/20 0425  WBC 7.4   < > 8.7   < > 9.4  --  10.6*  --  9.6  NEUTROABS 4.2  --  5.5  --   --   --   --   --   --   HGB 8.7*   < > 9.1*   < > 8.8*   < > 8.1* 8.2* 7.3*  HCT 28.3*   < > 29.3*   < > 28.4*   < > 26.3* 26.3* 24.0*  MCV 89.6   < > 89.3   < > 87.4  --  88.9  --  89.9  PLT 131*   < > 139*   < > 120*  --  111*  --  106*   < > = values in this interval not displayed.    [name] 02/24/2020 8:36 AM   On chart review history of reported renal transplant was noted.  She has seen Dr. Florene Glen and now transitioned to Dr. Carolin Sicks at [facility] after Dr. Abel Presto retirement.  Note per Dr. Abel Presto charting on 04/16/2018 "She reports she received a kidney transplant at a young age but there is no physical or radiological evidence of this assertion".  She has not been on anti-rejection meds.  He does note a history of urologic procedures as a child.    Anticipate may need a renal biopsy if she will agree.  Please do not start oral anticoagulation in the event that biopsy is needed.    [name], MD 5:00 PM 02/24/2020

## 2020-02-24 NOTE — Telephone Encounter (Signed)
Patient called office today requesting she speak with MD. Is currently admitted at [facility]. Would like to discuss care and treatment plan while she is at the hospital.  Advised patient to speak with RN or MD to request to speak with ID doctor who is doing rounds there. Patient hasn't been able to request this at the moment.  Will forward message to MD.  Aundria Rud, [place]

## 2020-02-24 NOTE — Progress Notes (Signed)
ANTICOAGULATION CONSULT NOTE - Follow Up Consult  Pharmacy Consult for Heparin Indication: DVT  Allergies  Allergen Reactions  . Bee Pollen Anaphylaxis  . Fish-Derived Products Hives, Shortness Of Breath, Swelling and Rash    Hives get in throat causing trouble breathing  . Mushroom Extract Complex Anaphylaxis  . Penicillins Anaphylaxis    **Tolerated cefepime March 2021 Did it involve swelling of the face/tongue/throat, SOB, or low BP? Yes Did it involve sudden or severe rash/hives, skin peeling, or any reaction on the inside of your mouth or nose? No Did you need to seek medical attention at a hospital or doctor's office? Yes When did it last happen?A few months ago If all above answers are "NO", may proceed with cephalosporin use.  [name] Kitchen Rosemary Oil Anaphylaxis  . Shellfish Allergy Hives, Shortness Of Breath, Swelling and Rash  . Tomato Hives and Shortness Of Breath    Hives in throat causes her trouble breathing  . Acetaminophen Other (See Comments)    GI upset  . Acyclovir And Related Other (See Comments)    Unknown rxn  . Aloe Vera Hives  . Broccoli [Brassica Oleracea] Hives  . Naproxen Other (See Comments)    Unknown rxn    Patient Measurements: Height: 5' 1"  (154.9 cm)(from 01/20/20 records) Weight: 83.2 kg (183 lb 7 oz) IBW/kg (Calculated) : 47.8 Heparin Dosing Weight:    Vital Signs: Temp: 97.4 F (36.3 C) (04/27 0405) Temp Source: Oral (04/27 0405) BP: 99/76 (04/27 0405) Pulse Rate: 69 (04/27 0405)  Labs: Recent Labs    02/22/20 0408 02/22/20 0408 02/23/20 0356 02/23/20 0356 02/23/20 1510 02/24/20 0425  HGB 8.8*   < > 8.1*   < > 8.2* 7.3*  HCT 28.4*   < > 26.3*  --  26.3* 24.0*  PLT 120*  --  111*  --   --  106*  HEPARINUNFRC 0.39  --  0.37  --   --  0.42  CREATININE 5.89*  --  6.26*  --   --  7.03*   < > = values in this interval not displayed.    Estimated Creatinine Clearance (by C-G formula based on SCr of 7.03 mg/dL (H)) Female: 7.9 mL/min  (A) Female: 9.7 mL/min (A)   Assessment:  Anticoag: lovenox 40>>Xarelto for new DVT. Changed to IV heparin for now due to anemia. Prob not HIT, 4T score 3. Transfused 4/17. Xarelto 74m BID started 4/2>heparin 4/14 - 4/2: LE dopplers - right/left acute DVT   - HL 0.42 in goal. Hgb 8.1>>7.3, Plts 106 down. No overt bleeding noted by RN. No issues with heparin drip. MD informed.   Goal of Therapy:  Heparin level 0.3-0.7 units/ml Monitor platelets by anticoagulation protocol: Yes   Plan:  Con't IV heparin at 1350 units/hr Wait for further directions on drop in hemoglobin Daily HL and CBC    Kani Chauvin A. PLevada Dy PharmD, BCPS, FNKF Clinical Pharmacist [place] Please utilize Amion for appropriate phone number to reach the unit pharmacist ([place]   02/24/2020,7:33 AM

## 2020-02-25 ENCOUNTER — Inpatient Hospital Stay (HOSPITAL_COMMUNITY): Payer: PRIVATE HEALTH INSURANCE

## 2020-02-25 DIAGNOSIS — R338 Other retention of urine: Secondary | ICD-10-CM

## 2020-02-25 DIAGNOSIS — D696 Thrombocytopenia, unspecified: Secondary | ICD-10-CM

## 2020-02-25 DIAGNOSIS — M549 Dorsalgia, unspecified: Secondary | ICD-10-CM

## 2020-02-25 DIAGNOSIS — D649 Anemia, unspecified: Secondary | ICD-10-CM

## 2020-02-25 DIAGNOSIS — I824Y9 Acute embolism and thrombosis of unspecified deep veins of unspecified proximal lower extremity: Secondary | ICD-10-CM

## 2020-02-25 HISTORY — PX: IR FLUORO GUIDE CV LINE RIGHT: IMG2283

## 2020-02-25 HISTORY — PX: IR US GUIDE VASC ACCESS RIGHT: IMG2390

## 2020-02-25 LAB — CBC
HCT: 21.7 % — ABNORMAL LOW (ref 36.0–46.0)
Hemoglobin: 6.7 g/dL — CL (ref 12.0–15.0)
MCH: 28 pg (ref 26.0–34.0)
MCHC: 30.9 g/dL (ref 30.0–36.0)
MCV: 90.8 fL (ref 80.0–100.0)
Platelets: 94 10*3/uL — ABNORMAL LOW (ref 150–400)
RBC: 2.39 MIL/uL — ABNORMAL LOW (ref 3.87–5.11)
RDW: 17 % — ABNORMAL HIGH (ref 11.5–15.5)
WBC: 9.2 10*3/uL (ref 4.0–10.5)
nRBC: 0 % (ref 0.0–0.2)

## 2020-02-25 LAB — URINALYSIS, COMPLETE (UACMP) WITH MICROSCOPIC
Bilirubin Urine: NEGATIVE
Glucose, UA: NEGATIVE mg/dL
Ketones, ur: NEGATIVE mg/dL
Nitrite: NEGATIVE
Protein, ur: 100 mg/dL — AB
Specific Gravity, Urine: 1.011 (ref 1.005–1.030)
WBC, UA: >50 WBC/hpf — ABNORMAL HIGH (ref 0–5)
pH: 5 (ref 5.0–8.0)

## 2020-02-25 LAB — COMPREHENSIVE METABOLIC PANEL
ALT: 11 U/L (ref 0–44)
AST: 20 U/L (ref 15–41)
Albumin: 1.8 g/dL — ABNORMAL LOW (ref 3.5–5.0)
Alkaline Phosphatase: 70 U/L (ref 38–126)
Anion gap: 19 — ABNORMAL HIGH (ref 5–15)
BUN: 98 mg/dL — ABNORMAL HIGH (ref 8–23)
CO2: 16 mmol/L — ABNORMAL LOW (ref 22–32)
Calcium: 8.5 mg/dL — ABNORMAL LOW (ref 8.9–10.3)
Chloride: 99 mmol/L (ref 98–111)
Creatinine, Ser: 7.47 mg/dL — ABNORMAL HIGH (ref 0.44–1.00)
GFR calc Af Amer: 6 mL/min — ABNORMAL LOW (ref >60)
GFR calc non Af Amer: 5 mL/min — ABNORMAL LOW (ref >60)
Glucose, Bld: 98 mg/dL (ref 70–99)
Potassium: 4.3 mmol/L (ref 3.5–5.1)
Sodium: 134 mmol/L — ABNORMAL LOW (ref 135–145)
Total Bilirubin: 0.5 mg/dL (ref 0.3–1.2)
Total Protein: 6.1 g/dL — ABNORMAL LOW (ref 6.5–8.1)

## 2020-02-25 LAB — PROTEIN / CREATININE RATIO, URINE
Creatinine, Urine: 83.91 mg/dL
Protein Creatinine Ratio: 1.29 mg/mg{Cre} — ABNORMAL HIGH (ref 0.00–0.15)
Total Protein, Urine: 108 mg/dL

## 2020-02-25 LAB — GLUCOSE, CAPILLARY
Glucose-Capillary: 87 mg/dL (ref 70–99)
Glucose-Capillary: 78 mg/dL (ref 70–99)
Glucose-Capillary: 84 mg/dL (ref 70–99)
Glucose-Capillary: 152 mg/dL — ABNORMAL HIGH (ref 70–99)

## 2020-02-25 LAB — SURGICAL PCR SCREEN
MRSA, PCR: NEGATIVE
Staphylococcus aureus: POSITIVE — AB

## 2020-02-25 LAB — MAGNESIUM: Magnesium: 2.4 mg/dL (ref 1.7–2.4)

## 2020-02-25 LAB — MPO/PR-3 (ANCA) ANTIBODIES
ANCA Proteinase 3: <3.5 U/mL (ref 0.0–3.5)
Myeloperoxidase Abs: <9 U/mL (ref 0.0–9.0)

## 2020-02-25 LAB — PREPARE RBC (CROSSMATCH)

## 2020-02-25 LAB — PHOSPHORUS: Phosphorus: 6.9 mg/dL — ABNORMAL HIGH (ref 2.5–4.6)

## 2020-02-25 LAB — HEPARIN LEVEL (UNFRACTIONATED): Heparin Unfractionated: 0.45 IU/mL (ref 0.30–0.70)

## 2020-02-25 MED ORDER — CHLORHEXIDINE GLUCONATE CLOTH 2 % EX PADS: 6.0000 | MEDICATED_PAD | Freq: Every day | CUTANEOUS | Status: DC

## 2020-02-25 MED ORDER — MIDAZOLAM HCL 2 MG/2ML IJ SOLN
INTRAMUSCULAR | Status: AC | PRN
  Administered 2020-02-25: 1 mg via INTRAVENOUS

## 2020-02-25 MED ORDER — GELATIN ABSORBABLE 12-7 MM EX MISC
CUTANEOUS | Status: AC
  Filled 2020-02-25: qty 1

## 2020-02-25 MED ORDER — SODIUM CHLORIDE 0.9% IV SOLUTION: Freq: Once | INTRAVENOUS | Status: DC

## 2020-02-25 MED ORDER — VANCOMYCIN HCL 500 MG/100ML IV SOLN
INTRAVENOUS | Status: AC | PRN
  Administered 2020-02-25: 1000 mg via INTRAVENOUS

## 2020-02-25 MED ORDER — FENTANYL CITRATE (PF) 100 MCG/2ML IJ SOLN
INTRAMUSCULAR | Status: AC | PRN
  Administered 2020-02-25: 50 ug via INTRAVENOUS

## 2020-02-25 MED ORDER — FENTANYL CITRATE (PF) 100 MCG/2ML IJ SOLN
INTRAMUSCULAR | Status: AC
  Filled 2020-02-25: qty 2

## 2020-02-25 MED ORDER — HEPARIN SODIUM (PORCINE) 1000 UNIT/ML IJ SOLN
INTRAMUSCULAR | Status: AC
  Administered 2020-02-26: 3200 [IU] via ARTERIOVENOUS_FISTULA
  Filled 2020-02-25: qty 1

## 2020-02-25 MED ORDER — VANCOMYCIN HCL IN DEXTROSE 1-5 GM/200ML-% IV SOLN
INTRAVENOUS | Status: AC
  Filled 2020-02-25: qty 200

## 2020-02-25 MED ORDER — HEPARIN SODIUM (PORCINE) 1000 UNIT/ML IJ SOLN
INTRAMUSCULAR | Status: AC
  Filled 2020-02-25: qty 3

## 2020-02-25 MED ORDER — LIDOCAINE HCL 1 % IJ SOLN
INTRAMUSCULAR | Status: AC
  Filled 2020-02-25: qty 20

## 2020-02-25 MED ORDER — MIDAZOLAM HCL 2 MG/2ML IJ SOLN
INTRAMUSCULAR | Status: AC
  Filled 2020-02-25: qty 2

## 2020-02-25 MED ORDER — LIDOCAINE HCL (PF) 1 % IJ SOLN
INTRAMUSCULAR | Status: AC | PRN
  Administered 2020-02-25: 5 mL

## 2020-02-25 MED ORDER — CHLORHEXIDINE GLUCONATE CLOTH 2 % EX PADS
6.0000 | MEDICATED_PAD | Freq: Every day | CUTANEOUS | Status: DC
  Administered 2020-02-26: 6 via TOPICAL

## 2020-02-25 NOTE — Progress Notes (Signed)
Physical Therapy Treatment Patient Details Name: [name] [name] MRN: [id number] DOB: 1956-03-02 Today's Date: 02/25/2020    History of Present Illness 64 year old Caucasian female with PMH CKD3, DMII, PTSD, neuropathy, hypertension, hyperlipidemia, GERD, fatty liver, cirrhosis, asthma, anemia who was recently hospitalized from 01/20/2020 until 01/29/2020, and was then discharged to inpatient rehab. She was hospitalized for osteomyelitis of L1 and underwent anterolateral corpectomy with PSIF on 01/22/2020 by neurosurgery. started developing progressive acute on chronic renal failure, transferred back to acute Care on 4/21    PT Comments    Continuing work on functional mobility and activity tolerance;  [name] [name]'s Hgb was low today, and she was scheduled for getting an HD catheter placed, so our session focused on getting Orthostatic BPs, gently looking at activity tolerance, and working with OT for continuing discernment of functional independence with ADLs and mobility to help with dc planning; [name] [name] was able to direct her care in donning the back brace, and overall needed min assist for bed mobiltiy, and basic transfers;   She wishes to go home, and that is acute PT's goal as well; with the knowledge that whe will likely need to be up and out of her house at least 3x/week for HD once she is home, it does sway me towards finishing her rehab at [facility] for more work on independence, endurance, and activity tolerance; Still, if she ultimately declines CIR, we can consider going home at more of a wheelchair transfer level;   Recommend undergoing HD in the recliner  Follow Up Recommendations  CIR;Other (comment)(Will monitor for progress; May progress wellenough to dc home with [facility] services)     Equipment Recommendations  Rolling walker with 5" wheels;3in1 (PT);Wheelchair (measurements PT);Wheelchair cushion (measurements PT)    Recommendations for Other Services       Precautions / Restrictions  Precautions Precautions: Back;Fall Precaution Comments: Reviewed Back Precautions Required Braces or Orthoses: Spinal Brace Spinal Brace: Thoracolumbosacral orthotic;Applied in supine position Restrictions Other Position/Activity Restrictions: TLSO donned in supine, must be worn when HOB >30deg; watch for Orthostatics; legally blind    Mobility  Bed Mobility Overal bed mobility: Needs Assistance Bed Mobility: Rolling;Sidelying to Sit Rolling: Min assist Sidelying to sit: Min assist       General bed mobility comments: Rolled to don TLSO; Very nice technqiue today; less assist needed to push u pto sit  Transfers Overall transfer level: Needs assistance Equipment used: Rolling walker (2 wheeled) Transfers: Sit to/from Stand Sit to Stand: Min assist         General transfer comment: Min assist to steady  Ambulation/Gait             General Gait Details: Pt opted not to walk, and to get back to bed in anticipation of procecures; Also, limited upright activity due to decr Hgb   Stairs             Wheelchair Mobility    Modified Rankin (Stroke Patients Only)       Balance     Sitting balance-Leahy Scale: Fair       Standing balance-Leahy Scale: Poor                              Cognition Arousal/Alertness: Awake/alert Behavior During Therapy: WFL for tasks assessed/performed Overall Cognitive Status: Within Functional Limits for tasks assessed  General Comments: Hesitant to work with PT/OT, required encouragement      Exercises      General Comments General comments (skin integrity, edema, etc.): Took orthostatic BPs, with minimal change from supine to sit; reported dizziness in standing, and unable to get a reliable BP while standing (first BP was super high due to very tense arm, second attempt in standing, vitals machine was unable to read -- may hve been low)      Pertinent  Vitals/Pain Pain Assessment: Faces Faces Pain Scale: Hurts little more Pain Location: lumbar region, especially when getting sidelying to sit Pain Descriptors / Indicators: Aching;Discomfort;Sore Pain Intervention(s): Monitored during session    Home Living                      Prior Function            PT Goals (current goals can now be found in the care plan section) Acute Rehab PT Goals Patient Stated Goal: to have less pain; is wondering about CIR versus going home PT Goal Formulation: With patient Time For Goal Achievement: 03/07/20 Potential to Achieve Goals: Good Progress towards PT goals: Progressing toward goals    Frequency    Min 3X/week      PT Plan Current plan remains appropriate    Co-evaluation PT/OT/SLP Co-Evaluation/Treatment: Yes Reason for Co-Treatment: Other (comment)(multi-disc approach to dc planning in cmplicated case) PT goals addressed during session: Mobility/safety with mobility        AM-PAC PT "6 Clicks" Mobility   Outcome Measure  Help needed turning from your back to your side while in a flat bed without using bedrails?: A Little Help needed moving from lying on your back to sitting on the side of a flat bed without using bedrails?: A Little Help needed moving to and from a bed to a chair (including a wheelchair)?: A Little Help needed standing up from a chair using your arms (e.g., wheelchair or bedside chair)?: A Little Help needed to walk in hospital room?: A Lot Help needed climbing 3-5 steps with a railing? : Total 6 Click Score: 15    End of Session Equipment Utilized During Treatment: Back brace Activity Tolerance: Other (comment)(limited activity due to decr Hgb) Patient left: in bed;with call bell/phone within reach;with bed alarm set Nurse Communication: Mobility status PT Visit Diagnosis: Muscle weakness (generalized) (M62.81);Other abnormalities of gait and mobility (R26.89);Pain Pain - Right/Left:  (bacl) Pain - part of body: (back)     Time: 7741-4239 PT Time Calculation (min) (ACUTE ONLY): 40 min  Charges:  $Therapeutic Activity: 8-22 mins                     Roney Marion, PT  Acute Rehabilitation Services Pager [phone] Office [phone]    Colletta [place] 02/25/2020, 3:07 PM

## 2020-02-25 NOTE — H&P (Signed)
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                                                              +--------+---------------+---------+-----------+----------+--------------------+ POP     Full           Yes      Yes                                       +--------+---------------+---------+-----------+----------+--------------------+ PTV     None                                         no blood flow                                                             visualized           +--------+---------------+---------+-----------+----------+--------------------+ PERO    None                                         no blood flow                                                             visualized           +--------+---------------+---------+-----------+----------+--------------------+   +--------+---------------+---------+-----------+----------+--------------------+ LEFT    CompressibilityPhasicitySpontaneityPropertiesThrombus Aging       +--------+---------------+---------+-----------+----------+--------------------+ CFV     Full           Yes      Yes                                       +--------+---------------+---------+-----------+----------+--------------------+ SFJ     Full                                                               +--------+---------------+---------+-----------+----------+--------------------+ FV Prox Full                                                              +--------+---------------+---------+-----------+----------+--------------------+ FV Mid  Full                                                              +--------+---------------+---------+-----------+----------+--------------------+  FV      Full                                                              Distal                                                                    +--------+---------------+---------+-----------+----------+--------------------+ PFV     Full                                                              +--------+---------------+---------+-----------+----------+--------------------+ POP     Full           Yes      Yes                                       +--------+---------------+---------+-----------+----------+--------------------+ PTV                                                  no blood flow                                                             visualized           +--------+---------------+---------+-----------+----------+--------------------+ PERO                                                 no blood flow                                                             visualized           +--------+---------------+---------+-----------+----------+--------------------+     Summary: RIGHT: - Findings consistent with acute deep vein thrombosis involving the right peroneal veins, and right posterior tibial veins. - No cystic structure found in the popliteal fossa.  LEFT: - Findings consistent with acute deep vein thrombosis involving the left peroneal veins, and left posterior tibial veins. - No cystic structure found in the popliteal fossa.  *See table(s) above for measurements and observations. Electronically signed by Curt Jews MD on 02/01/2020 at  8:19:23 AM.  Final     Labs:  CBC: Recent Labs    02/22/20 0408 02/22/20 0408 02/23/20 0356 02/23/20 1510 02/24/20 0425 02/24/20 0746 02/24/20 2019 02/25/20 0250  WBC 9.4  --  10.6*  --  9.6  --   --  9.2  HGB 8.8*   < > 8.1*   < > 7.3* 7.5* 7.5* 6.7*  HCT 28.4*   < > 26.3*   < > 24.0* 24.4* 24.3* 21.7*  PLT 120*  --  111*  --  106*  --   --  94*   < > = values in this interval not displayed.    COAGS: Recent Labs    04/19/19 0854 01/22/20 0534 02/11/20 1534 02/12/20 1413 02/13/20 0025 02/14/20 0416 02/15/20 0353  INR 1.1 1.2  --   --   --   --   --   APTT  --  31   < > 48* 85* 75* 69*   < > = values in this interval not displayed.    BMP: Recent Labs    02/22/20 0408 02/23/20 0356 02/24/20 0425 02/25/20 0250  NA 137 136 134* 134*  K 4.3 4.0 4.0 4.3  CL 98 97* 97* 99  CO2 _0 16*  GLUCOSE 159* 129* 120* 98  BUN 77* 86* 93* 98*  CALCIUM 8.6* 8.7* 8.3* 8.5*  CREATININE 5.89* 6.26* 7.03* 7.47*  GFRNONAA 7* 6* 6* 5*  GFRAA 8* 8* 7* 6*    LIVER FUNCTION TESTS: Recent Labs    02/22/20 0408 02/23/20 0356 02/24/20 0425 02/25/20 0250  BILITOT 0.5 0.5 0.5 0.5  AST _1 ALT _2 ALKPHOS 90 86 82 70  PROT 7.1 7.1 6.4* 6.1*  ALBUMIN 2.0* 2.0* 1.9* 1.8*    TUMOR MARKERS: No results for input(s): AFPTM, CEA, CA199, CHROMGRNA in the last 8760 hours.  Assessment and Plan:  Renal failure with uremic symptoms.  Will proceed with image guided placement of a tunneled hemodialysis catheter today by Dr. Earleen Newport.  Risks and benefits discussed with the patient including, but not limited to bleeding, infection, vascular injury, pneumothorax which may require chest tube placement, air embolism or even death  All of the patient's questions were answered, patient is agreeable to proceed. Consent signed and in chart.  Thank you for this interesting consult.  I greatly enjoyed meeting [name] [name] and look forward to participating in  their care.  A copy of this report was sent to the requesting provider on this date.  Electronically Signed: Murrell Redden, PA-C   02/25/2020, 10:45 AM      I spent a total of 20 Minutes in face to face in clinical consultation, greater than 50% of which was counseling/coordinating care for HD catheter.

## 2020-02-25 NOTE — Procedures (Signed)
Interventional Radiology Procedure Note  Procedure: Placement of a right IJ approach tunneled HD.  19cm tip to cuff.  Tip is positioned at the superior cavoatrial junction and catheter is ready for immediate use.  Complications: None Recommendations:  - Ok to use - Do not submerge - Routine line care   Signed,  Dulcy Fanny. Earleen Newport, DO

## 2020-02-25 NOTE — Progress Notes (Signed)
Results for CHARNESE, FEDERICI (MRN [id number]) as of 02/25/2020 03:24  Ref. Range 02/25/2020 02:50  Hemoglobin Latest Ref Range: 12.0 - 15.0 g/dL 6.7 (LL)  MD on Call paged.

## 2020-02-25 NOTE — Progress Notes (Signed)
PROGRESS NOTE    [name] [name]  OPF:[id number] DOB: [birth year] DOA: 02/18/2020 PCP: Ann Held, DO   Brief Narrative:  HPI On 02/18/2020 by Dr. Dwyane Dee Patient is transferred from inpatient rehab.  See progress notes as well dated 02/18/2020 from hospitalist, nephrology, and PM&R. Briefly, patient is a 64 year old Caucasian female with PMH CKD3, DMII, PTSD, neuropathy, hypertension, hyperlipidemia, GERD, fatty liver, cirrhosis, asthma, anemia who was recently hospitalized from 01/20/2020 until 01/29/2020, and was then discharged to inpatient rehab.  She was hospitalized for osteomyelitis of L1 and underwent anterolateral corpectomy with PSIF on 01/22/2020 by neurosurgery.  Tissue cultures grew Pseudomonas and she was also followed by ID.  She had initially been on cefepime which was to be completed on 03/05/2020 for a total of 6 weeks of therapy.   She started developing progressive acute on chronic renal failure and was transitioned to meropenem on 4/17 to finish completing her course.  She was also followed by nephrology as well.  She has had adequate urine output and has also remained on twice daily IV Lasix.  She has not met criteria for initiating dialysis at this time.  Suspicion is for AIN due to cefepime. She had also been on a heparin drip for bilateral lower extremity DVT which was diagnosed on 01/30/2020.  Also, she has had a downtrending hemoglobin and has been evaluated by GI (positive hemoccult).  She underwent EGD on 02/18/2020 (she had been declining colonoscopy), her EGD report was unable to be reviewed at time of this note.   She was transferred for ongoing work-up and management by nephrology, hospitalist, GI.  Interim history Anemia with down trending hemoglobin, gastroenterology was consulted, status post EGD on 4/21 which showed mild gastritis.  She had declined colonoscopy.  Patient now with worsening creatinine.  Nephrology consulted.  IR consulted for placement  of tunneled hemodialysis catheter. 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  Secondary to medications with anticholinergic side effects such as opiates, diazepam, Atarax -Difficult situation to control her pain -As above, MRI of the thoracic and lumbar spine without any acute findings -" conus and cauda equina appear normal" -Continue indwelling Foley catheter -Continue low-dose bethanechol  Normocytic anemia -Baseline hemoglobin 11-13 prior to previous admission, recently has been 7-9 -FOBT was positive, patient has received 3 units PRBC -No obvious signs of overt bleeding -Suggestive of anemia of chronic disease, B12 is low normal -Patient has declined colonoscopy in the past -Gastroenterology was consulted and appreciated, status post EGD on 421 with mild gastritis, no esophageal or fundal varices.  Pathology showed small bowel biopsy negative for increased  intraepithelial lymphocytes or  villous architectural changes-gastric antral mucosa with chronic gastritis and reactive change, negative stain for H. pylori-esophageal squamous and cardiac mucosa with mild chronic nonspecific cardio esophagitis, negative for intestinal metaplasia or dysplasia -Hemoglobin down to 6.7 today, PRBC ordered for transfusion -Continue to monitor CBC closely  Hypervolemia -Suspect secondary to renal failure and IV fluids -Patient did develop respiratory distress and wheezing on 02/14/2020 -BNP elevated -Chest x-ray with pulmonary congestion -No history of CHF -Patient was given IV Lasix however this is now held due to AKI -Respiratory symptoms have resolved  Thrombocytopenia -platelets down to 94 -?related to liver cirrhosis -may consider hem/onc consult  Controlled diabetes mellitus, type II -With CKD -Most recent hemoglobin A1c was 12.5 -Continue insulin sliding scale CBG monitor -Continue statin  Recent bilateral lower extremity DVT -Diagnosed on 01/30/2020, was on Xarelto however this was discontinued given bleeding and placed on heparin -if platelets and hemoglobin continues to drop, will consider IVC filter  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 -Acute hepatitis panel and HIV negative -She is not immune to hepatitis B  Chronic left heel ulcer -No signs of infection, appreciate wound care assistance  Possible obstructive sleep -Patient would benefit from outpatient sleep study  Anxiety/depression/insomnia -Could be playing a role in her uncontrolled pain -Continue Cymbalta, trazodone, Valium, Atarax  Depressed mood/goals of care -Patient seems to be frustrated with her long hospital stay and wants to go home soon as possible -It seemed that she thought she was dying after discussion regarding dialysis on the morning of 02/24/2020  DVT Prophylaxis  Heparin  Code Status: Full  Family Communication: None at bedside  Disposition Plan:   Status is: Inpatient  Remains inpatient appropriate because:Inpatient level of care appropriate due to severity of illness.  Worsening renal failure, will have tunneled hemodialysis catheter placed today.  Continues to have anemia and requiring blood transfusions.   Dispo: The patient is from: [facility]              Anticipated d/c is to: Home              Anticipated d/c date is: > 3 days              Patient currently is not medically stable to d/c.   Consultants Nephrology Gastroenterology Infectious disease Interventional radiology  Procedures  EGD 4/21 Anterolateral corpectomy with PSIF for osteomyelitison 01/22/2020 by neurosurgery  Antibiotics   Anti-infectives (From admission, onward)   Start     Dose/Rate Route Frequency Ordered Stop   02/18/20 2330  meropenem (MERREM) 1 g in sodium chloride 0.9 % 100 mL IVPB     1 g 200 mL/hr over 30 Minutes Intravenous Every 24 hours 02/18/20 2241        Subjective:   [name] seen and examined today.  Feels that she needs to be cleaned up and needs help from the nurse today.  Denies current chest pain or shortness of breath.  Denies abdominal pain, nausea or vomiting, dizziness or headache.  States that she did have an episode of dizziness yesterday.  Continues to have pain.  Objective:   Vitals:   02/24/20 2045 02/25/20 0439 02/25/20 0955 02/25/20 1200  BP: (!) 135/95 (!) 111/57 (!) 112/55 108/60  Pulse: 85 71 67 77  Resp: _0 Temp: 98.1 F (36.7 C) 97.8 F (36.6 C) (!) 97.4 F (36.3 C) 97.6 F (36.4 C)  TempSrc: Oral  Oral Oral  SpO2: 95% 96% 92% 98%  Weight:  Height:        Intake/Output Summary (Last 24 hours) at 02/25/2020 1226 Last data filed at 02/25/2020 0820 Gross per 24 hour  Intake 654.59 ml  Output 400 ml  Net 254.59 ml   Filed Weights   02/19/20 2200 02/20/20 2054 02/21/20 2123  Weight: 84.4 kg 83.2 kg 83.2 kg    Exam  General: Well developed, chronically ill-appearing,  NAD  HEENT: NCAT,mucous membranes moist.   Cardiovascular: S1 S2 auscultated, RRR, no murmur  Respiratory: Clear to auscultation bilaterally   Abdomen: Soft, nontender, nondistended, + bowel sounds  Extremities: warm dry without cyanosis clubbing or edema  Neuro: AAOx3, nonfocal  Psych: Appropriate mood and affect   Data Reviewed: I have personally reviewed following labs and imaging studies  CBC: Recent Labs  Lab 02/19/20 0933 02/20/20 0302 02/21/20 0355 02/21/20 0355 02/22/20 0408 02/22/20 0408 02/23/20 0356 02/23/20 0356 02/23/20 1510 02/24/20 0425 02/24/20 0746 02/24/20 2019 02/25/20 0250  WBC 8.7   < > 10.4  --  9.4  --  10.6*  --   --  9.6  --   --  9.2  NEUTROABS 5.5  --   --   --   --   --   --   --   --   --   --   --   --   HGB 9.1*   < > 9.7*   < > 8.8*   < > 8.1*   < > 8.2* 7.3* 7.5* 7.5* 6.7*  HCT 29.3*   < > 30.3*   < > 28.4*   < > 26.3*   < > 26.3* 24.0* 24.4* 24.3* 21.7*  MCV 89.3   < > 87.1  --  87.4  --  88.9  --   --  89.9  --   --  90.8  PLT 139*   < > 133*  --  120*  --  111*  --   --  106*  --   --  94*   < > = values in this interval not displayed.   Basic Metabolic Panel: Recent Labs  Lab 02/21/20 0355 02/22/20 0408 02/23/20 0356 02/24/20 0425 02/25/20 0250  NA 136 137 136 134* 134*  K 4.2 4.3 4.0 4.0 4.3  CL 97* 98 97* 97* 99  CO2 _0 16*  GLUCOSE 154* 159* 129* 120* 98  BUN 77* 77* 86* 93* 98*  CREATININE 6.25* 5.89* 6.26* 7.03* 7.47*  CALCIUM 8.6* 8.6* 8.7* 8.3* 8.5*  MG 2.3 2.2 2.3 2.3 2.4  PHOS 5.3* 5.4* 5.7* 6.0* 6.9*   GFR: Estimated Creatinine Clearance (by C-G formula based on SCr of 7.47 mg/dL (H)) Female: 7.4 mL/min (A) Female: 9.1 mL/min (A) Liver Function Tests: Recent Labs  Lab 02/21/20 0355 02/22/20 0408 02/23/20 0356 02/24/20 0425 02/25/20 0250  AST _1 ALT _2 ALKPHOS 103 90 86 82 70  BILITOT 0.4 0.5 0.5 0.5 0.5  PROT 7.6 7.1 7.1 6.4* 6.1*  ALBUMIN 2.1* 2.0* 2.0* 1.9*  1.8*   No results for input(s): LIPASE, AMYLASE in the last 168 hours. No results for input(s): AMMONIA in the last 168 hours. Coagulation Profile: No results for input(s): INR, PROTIME in the last 168 hours. Cardiac Enzymes: No results for input(s): CKTOTAL, CKMB, CKMBINDEX, TROPONINI in the last 168 hours. BNP (last 3 results) No results for input(s): PROBNP in the last 8760 hours. HbA1C: No results for input(s):  HGBA1C in the last 72 hours. CBG: Recent Labs  Lab 02/24/20 1108 02/24/20 1632 02/24/20 2051 02/25/20 0640 02/25/20 1125  GLUCAP 103* 102* 148* 87 78   Lipid Profile: No results for input(s): CHOL, HDL, LDLCALC, TRIG, CHOLHDL, LDLDIRECT in the last 72 hours. Thyroid Function Tests: No results for input(s): TSH, T4TOTAL, FREET4, T3FREE, THYROIDAB in the last 72 hours. Anemia Panel: No results for input(s): VITAMINB12, FOLATE, FERRITIN, TIBC, IRON, RETICCTPCT in the last 72 hours. Urine analysis:    Component Value Date/Time   COLORURINE YELLOW 02/13/2020 1630   APPEARANCEUR HAZY (A) 02/13/2020 1630   LABSPEC 1.008 02/13/2020 1630   PHURINE 5.0 02/13/2020 1630   GLUCOSEU NEGATIVE 02/13/2020 1630   GLUCOSEU 500 (A) 12/03/2019 1309   HGBUR MODERATE (A) 02/13/2020 1630   HGBUR moderate 08/05/2010 1048   BILIRUBINUR NEGATIVE 02/13/2020 1630   BILIRUBINUR neg 11/15/2017 1241   KETONESUR NEGATIVE 02/13/2020 1630   PROTEINUR 30 (A) 02/13/2020 1630   UROBILINOGEN >=8.0 (A) 12/03/2019 1309   NITRITE NEGATIVE 02/13/2020 1630   LEUKOCYTESUR SMALL (A) 02/13/2020 1630   Sepsis Labs: _0 (procalcitonin:4,lacticidven:4)  ) Recent Results (from the past 240 hour(s))  Surgical pcr screen     Status: Abnormal   Collection Time: 02/25/20  1:48 AM   Specimen: Nasal Mucosa; Nasal Swab  Result Value Ref Range Status   MRSA, PCR NEGATIVE NEGATIVE Final   Staphylococcus aureus POSITIVE (A) NEGATIVE Final    Comment: CRITICAL RESULT CALLED TO, READ BACK BY AND VERIFIED  WITH: RN, E. CASTRO [id number] _1  [facility] Performed at [facility] [address], [place], [place] [address]       Radiology Studies: No results found.   Scheduled Meds: . sodium chloride   Intravenous Once  . atorvastatin  10 mg Oral Daily  . bethanechol  10 mg Oral TID  . Chlorhexidine Gluconate Cloth  6 each Topical Daily  . insulin aspart  0-9 Units Subcutaneous TID AC & HS  . lidocaine  1 patch Transdermal Q24H  . multivitamin with minerals  1 tablet Oral Daily  . pantoprazole  40 mg Oral Daily  . polyethylene glycol  17 g Oral BID  . pregabalin  75 mg Oral QHS  . psyllium  1 packet Oral Daily  . senna-docusate  2 tablet Oral QHS  . sodium chloride flush  10-40 mL Intracatheter Q12H  . traZODone  100 mg Oral QHS   Continuous Infusions: . heparin 1,350 Units/hr (02/25/20 1137)  . meropenem (MERREM) IV 1 g (02/24/20 2314)     LOS: 7 days   Time Spent in minutes   45 minutes  Ariah Mower D.O. on 02/25/2020 at 12:26 PM  Between 7am to 7pm - Please see pager noted on [email or web address]  After 7pm go to [email or web address]  And look for the night coverage person covering for me after hours  [facility] Office  [phone]

## 2020-02-25 NOTE — Progress Notes (Signed)
ANTICOAGULATION CONSULT NOTE - Follow Up Consult  Pharmacy Consult for Heparin Indication: DVT  Allergies  Allergen Reactions  . Bee Pollen Anaphylaxis  . Fish-Derived Products Hives, Shortness Of Breath, Swelling and Rash    Hives get in throat causing trouble breathing  . Mushroom Extract Complex Anaphylaxis  . Penicillins Anaphylaxis    **Tolerated cefepime March 2021 Did it involve swelling of the face/tongue/throat, SOB, or low BP? Yes Did it involve sudden or severe rash/hives, skin peeling, or any reaction on the inside of your mouth or nose? No Did you need to seek medical attention at a hospital or doctor's office? Yes When did it last happen?A few months ago If all above answers are "NO", may proceed with cephalosporin use.  [name] Kitchen Rosemary Oil Anaphylaxis  . Shellfish Allergy Hives, Shortness Of Breath, Swelling and Rash  . Tomato Hives and Shortness Of Breath    Hives in throat causes her trouble breathing  . Acetaminophen Other (See Comments)    GI upset  . Acyclovir And Related Other (See Comments)    Unknown rxn  . Aloe Vera Hives  . Broccoli [Brassica Oleracea] Hives  . Naproxen Other (See Comments)    Unknown rxn    Patient Measurements: Height: 5' 1"  (154.9 cm)(from 01/20/20 records) Weight: 83.2 kg (183 lb 7 oz) IBW/kg (Calculated) : 47.8 Heparin Dosing Weight:    Vital Signs: Temp: 97.8 F (36.6 C) (04/28 0439) Temp Source: Oral (04/27 2045) BP: 111/57 (04/28 0439) Pulse Rate: 71 (04/28 0439)  Labs: Recent Labs    02/23/20 0356 02/23/20 1510 02/24/20 0425 02/24/20 0425 02/24/20 0746 02/24/20 0746 02/24/20 2019 02/25/20 0250  HGB 8.1*   < > 7.3*   < > 7.5*   < > 7.5* 6.7*  HCT 26.3*   < > 24.0*   < > 24.4*  --  24.3* 21.7*  PLT 111*  --  106*  --   --   --   --  94*  HEPARINUNFRC 0.37  --  0.42  --   --   --   --  0.45  CREATININE 6.26*  --  7.03*  --   --   --   --  7.47*   < > = values in this interval not displayed.    Estimated  Creatinine Clearance (by C-G formula based on SCr of 7.47 mg/dL (H)) Female: 7.4 mL/min (A) Female: 9.1 mL/min (A)   Assessment:  Anticoag: lovenox 40>>Xarelto for new DVT. Changed to IV heparin for now due to anemia. Prob not HIT, 4T score 3. Transfused 4/17. Xarelto 57m BID started 4/2>heparin 4/14 - 4/2: LE dopplers - right/left acute DVT   - HL 0.45 in goal. Hgb 8.1>>7.3>>6.7, Plts 94 (down). No overt bleeding noted. No issues with heparin drip. MD aware of dropping hemoglobin. Patient will likely be transfused.    Goal of Therapy:  Heparin level 0.3-0.7 units/ml Monitor platelets by anticoagulation protocol: Yes   Plan:  Con't IV heparin at 1350 units/hr F/u need to hold heparin drip for dialysis access placement Daily HL and CBC    Sherena Machorro A. PLevada Dy PharmD, BCPS, FNKF Clinical Pharmacist [place] Please utilize Amion for appropriate phone number to reach the unit pharmacist ([place]   02/25/2020,7:30 AM

## 2020-02-25 NOTE — Progress Notes (Signed)
Occupational Therapy Evaluation Patient Details Name: [name] [name] MRN: [id number] DOB: [birth year] Today's Date: 02/25/2020    History of Present Illness 64 year old Caucasian female with PMH CKD3, DMII, PTSD, neuropathy, hypertension, hyperlipidemia, GERD, fatty liver, cirrhosis, asthma, anemia who was recently hospitalized from 01/20/2020 until 01/29/2020, and was then discharged to inpatient rehab. She was hospitalized for osteomyelitis of L1 and underwent anterolateral corpectomy with PSIF on 01/22/2020 by neurosurgery. started developing progressive acute on chronic renal failure, transferred back to acute Care on 4/21   Clinical Impression   With encouragement and time spent educating/collaborating with pt, pt agreeable to work with therapy. Today's session somewhat limited by low hgb and orthostatic BP readings obtained. PTA, pt lives with spouse who works and has aide that assists as needed. Recently at home, pt limited in independence due to increasing back pain. After recent back surgery, this pain appears to be more controlled. Pt Min A for all bed mobility (rolling to don TLSO brace) and Min A for sit to stand trial with RW. BP readings assessed with every transition (see below) with 3rd reading with questionable accuracy. Pt did endorse dizziness and became unsteady in standing with Min A needed to maintain balance. Extended time spent with pt/PT to establish pt rapport, educate on rehab options, needed support at home, DME options, and pt's personal goals to maximize independence and QOL.   Currently recommended pt to DC back to CIR to maximize independence and strength in light of new transition to HD. If pt declines to return to CIR, pt may be able to progress to home with [facility] given adequate support at home and completion of tasks wheelchair level.    Follow Up Recommendations  CIR;Supervision/Assistance - 24 hour(could progress to home with [facility])    Equipment Recommendations  Other  (comment)(Pt with new wheelchair, may have all DME needs met)    Recommendations for Other Services Rehab consult     Precautions / Restrictions Precautions Precautions: Back;Fall Precaution Comments: Reviewed Back Precautions Required Braces or Orthoses: Spinal Brace Spinal Brace: Thoracolumbosacral orthotic;Applied in supine position Restrictions Weight Bearing Restrictions: No Other Position/Activity Restrictions: TLSO donned in supine, must be worn when HOB >30deg; watch for Orthostatics; legally blind      Mobility Bed Mobility Overal bed mobility: Needs Assistance Bed Mobility: Rolling;Sidelying to Sit Rolling: Min assist Sidelying to sit: Min assist       General bed mobility comments: Rolled to don TLSO; Very nice technqiue today; less assist needed to push u pto sit  Transfers Overall transfer level: Needs assistance Equipment used: Rolling walker (2 wheeled) Transfers: Sit to/from Stand Sit to Stand: Min assist         General transfer comment: Min assist to steady    Balance     Sitting balance-Leahy Scale: Fair       Standing balance-Leahy Scale: Poor                             ADL either performed or assessed with clinical judgement   ADL Overall ADL's : Needs assistance/impaired Eating/Feeding: Sitting;Set up   Grooming: Minimal assistance;Standing   Upper Body Bathing: Moderate assistance;Sitting   Lower Body Bathing: Moderate assistance;Cueing for back precautions   Upper Body Dressing : Moderate assistance;Sitting   Lower Body Dressing: Maximal assistance;Sitting/lateral leans;Sit to/from stand;Cueing for back precautions;Cueing for sequencing;Cueing for compensatory techniques     Toilet Transfer Details (indicate cue type and reason): did not  attempt Toileting- Clothing Manipulation and Hygiene: Maximal assistance;Cueing for back precautions;Sitting/lateral lean;Sit to/from stand         General ADL Comments: Pt  with low hgb and dizziness in standing - unable to fully assess transfers and ADLs in standing on this date. Pt with improving pain     Vision Baseline Vision/History: Legally blind Patient Visual Report: No change from baseline       Perception     Praxis      Pertinent Vitals/Pain Pain Assessment: Faces Faces Pain Scale: Hurts little more Pain Location: lumbar region, especially when getting sidelying to sit Pain Descriptors / Indicators: Aching;Discomfort;Sore Pain Intervention(s): Monitored during session     Hand Dominance Left   Extremity/Trunk Assessment Upper Extremity Assessment Upper Extremity Assessment: Generalized weakness           Communication Communication Communication: No difficulties   Cognition Arousal/Alertness: Awake/alert Behavior During Therapy: WFL for tasks assessed/performed Overall Cognitive Status: Within Functional Limits for tasks assessed                                 General Comments: Hesitant to work with PT/OT, required encouragement   General Comments  Pt with low hgb, assessed orthostatic BPs during session. 110/52 (78bpm) at rest in bed, 106/85 sitting EOB (73bpm), and 123/52 (69) in standing. Unsure of accuracy of standing reading as was taken twice due to increased WB through arm on RW. Increased time to obtain last reading standing, then sitting as pt with unsteadiness and reports of dizziness. Pt's lips appeared to have decreased color as well    Exercises     Shoulder Instructions      Home Living Family/patient expects to be discharged to:: Private residence Living Arrangements: Spouse/significant other;Other relatives;Non-relatives/Friends Available Help at Discharge: Family;Available PRN/intermittently;Personal care attendant Type of Home: Apartment Home Access: Level entry     Home Layout: One level     Bathroom Shower/Tub: Occupational psychologist: Handicapped height Bathroom  Accessibility: Yes   Home Equipment: Environmental consultant - 2 wheels;Bedside commode;Shower seat;Electric scooter      Lives With: Spouse    Prior Functioning/Environment Level of Independence: Needs assistance  Gait / Transfers Assistance Needed: Leading up to the acute admission that lead to CIR stay, had not been ambulating for past 3 weeks. Has been doing stand pivot transfers from bed to bedside commode or chair w/ assist from husband and/or aide.  ADL's / Homemaking Assistance Needed: before pt's recent medical complications over the past month - pt was able to ambulate to bathroom, shower self. Pt may have required assistance with LB dressing tasks. Pt has aide that assists with iADLs.    Comments: Pt mostly limited in daily task independence due to back pain, requiring assistance from husband/aide. Pt with improving pain since back suregry         OT Problem List: Decreased strength;Decreased activity tolerance;Impaired balance (sitting and/or standing);Impaired vision/perception;Decreased knowledge of use of DME or AE;Decreased safety awareness;Decreased knowledge of precautions;Pain      OT Treatment/Interventions: Self-care/ADL training;DME and/or AE instruction;Therapeutic activities;Therapeutic exercise;Patient/family education;Balance training    OT Goals(Current goals can be found in the care plan section) Acute Rehab OT Goals Patient Stated Goal: to have less pain; is wondering about CIR versus going home OT Goal Formulation: With patient Time For Goal Achievement: 03/10/20 Potential to Achieve Goals: Good ADL Goals Pt Will Perform Grooming: with supervision;standing Pt  Will Perform Lower Body Bathing: with mod assist;sit to/from stand;sitting/lateral leans Pt Will Perform Lower Body Dressing: with mod assist;with adaptive equipment;sitting/lateral leans;sit to/from stand Pt Will Transfer to Toilet: with min assist;stand pivot transfer;bedside commode Pt Will Perform Toileting -  Clothing Manipulation and hygiene: with mod assist;sitting/lateral leans;sit to/from stand Additional ADL Goal #1: Pt to Independently verbalize 3/3 back precautions to implement during ADLs in order to maximize safety and independence during recovery  OT Frequency: Min 2X/week   Barriers to D/C:            Co-evaluation PT/OT/SLP Co-Evaluation/Treatment: Yes Reason for Co-Treatment: Complexity of the patient's impairments (multi-system involvement)(DC planning due to complicated case) PT goals addressed during session: Mobility/safety with mobility        AM-PAC OT "6 Clicks" Daily Activity     Outcome Measure Help from another person eating meals?: A Little Help from another person taking care of personal grooming?: A Little Help from another person toileting, which includes using toliet, bedpan, or urinal?: A Lot Help from another person bathing (including washing, rinsing, drying)?: A Lot Help from another person to put on and taking off regular upper body clothing?: A Little Help from another person to put on and taking off regular lower body clothing?: A Lot 6 Click Score: 15   End of Session Equipment Utilized During Treatment: Gait belt;Rolling walker;Back brace Nurse Communication: Mobility status  Activity Tolerance: Treatment limited secondary to medical complications (Comment)(Limited by low hgb) Patient left: in bed;with call bell/phone within reach;with bed alarm set;with SCD's reapplied  OT Visit Diagnosis: Unsteadiness on feet (R26.81);Pain;Low vision, both eyes (H54.2)                Time: 2426-8341 OT Time Calculation (min): 37 min Charges:  OT General Charges $OT Visit: 1 Visit OT Evaluation $OT Eval Moderate Complexity: 1 Mod  Layla Maw, OTR/L  Layla Maw 02/25/2020, 3:42 PM

## 2020-02-25 NOTE — Progress Notes (Signed)
[name] Progress Note   PCP: Ann Held, DO Admit Date: 02/18/2020 Requesting Physician: Cristal Ford, DO Reason for Consult:  AKI Assessment/ Plan:   Assessment & Plan  1. Acute Kidney Injury, oliguric Patient with worsening mental status and myoclonus vs asterixis on exam today. UOP 400cc last 24 hours. Unknown cause, currently suspected to be AIN from cefepime for pseudomonal discitis (changed to merepenem 4/17).  Patient has severe diabetes which is likely involved in some capacity.  Last urine studies on 02/13/20.Will obtain UA, Umicro and UPC today, patient has been off of lasix 4/26. Given worsening Cr and BUN, will plan for short dialysis today.  - orders for IR cath - continue foley & supportive care  - continue PPI with positive FOBT  -ANCA currently in process - patient & husband agreeable for starting dialysis today  2. Subnephrotic range proteinuria -repeat UPC today 3. CKD stage III  History of CKD stage III likely secondary to uncontrolled diabetes.  Creatinine on 01/20/2020 was 1.35. 4. Urinary retention -continue Foley -Continue bethanechol 5. Pseudomonal discitis  L1 compression fracture  status post cefepime transition to meropenem on 02/14/2020.  Continue meropenem through 03/05/2020.   -Pain management per primary team. -Renally dose medications 6. Poorly controlled Diabetes, with severe microvascular disease -per primary 7. Anemia, normocytic  Hgb continues to decline this morning at 6.7 this morning.  Primary team has put in for 1 unit for transfusion. -Follow-up posttransfusion hemoglobin 8. Constipation, per primary.  Patient notes that she has had some loose stools.  -No Fleet enemas 9. 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 Feels "fine".    Objective:   BP (!) 111/57 (BP Location: Left Arm)   Pulse 71   Temp 97.8 F (36.6 C)   Resp 18   Ht 5' 1"  (1.549 m) Comment: from 01/20/20 records   Wt 83.2 kg   SpO2 96%   BMI 34.66 kg/m  Intake/Output      04/27 0701 - 04/28 0700 04/28 0701 - 04/29 0700   P.O. 240 0   I.V. (mL/kg) 314.6 (3.8)    Other 0    IV Piggyback 100    Total Intake(mL/kg) 654.6 (7.9) 0 (0)   Urine (mL/kg/hr) 400 (0.2)    Emesis/NG output 0    Other 0    Stool 0    Blood 0    Total Output 400    Net +254.6 0        Urine Occurrence 0 x    Stool Occurrence 1 x    Emesis Occurrence 0 x     Filed Weights   02/19/20 2200 02/20/20 2054 02/21/20 2123  Weight: 84.4 kg 83.2 kg 83.2 kg   Weight change:   Physical Exam: Gen: Ill-appearing female, no acute distress, on the phone with her husband.  Patient had trouble a facemask that we laid on her abdomen.  She also had trouble using her cell phone.  Noted to have low-frequency tremor versus myoclonus, especially noted while holding phone and arm elevated.  Mild jaundice. Resp: Breathing comfortably on room air Ext: No lower extremity edema, extremities warm and well perfused  Imaging: No results found.  Labs: BMET Recent Labs  Lab 02/19/20 0933 02/20/20 0302 02/21/20 0355 02/22/20 0408 02/23/20 0356 02/24/20 0425 02/25/20 0250  NA 139 138 136 137 136 134* 134*  K 5.2* 4.5 4.2 4.3 4.0 4.0 4.3  CL 105 101 97* 98 97* 97* 99  CO2 23 25 28 27 28 24  16*  GLUCOSE 106* 103* 154* 159* 129* 120* 98  BUN 72* 74* 77* 77* 86* 93* 98*  CREATININE 6.82* 6.56* 6.25* 5.89* 6.26* 7.03* 7.47*  CALCIUM 8.6* 8.5* 8.6* 8.6* 8.7* 8.3* 8.5*  PHOS 4.5 4.9* 5.3* 5.4* 5.7* 6.0* 6.9*   CBC Recent Labs  Lab 02/19/20 0933 02/20/20 0302 02/22/20 0408 02/22/20 0408 02/23/20 0356 02/23/20 1510 02/24/20 0425 02/24/20 0746 02/24/20 2019 02/25/20 0250  WBC 8.7   < > 9.4  --  10.6*  --  9.6  --   --  9.2  NEUTROABS 5.5  --   --   --   --   --   --   --   --   --   HGB 9.1*   < > 8.8*   < > 8.1*   < > 7.3* 7.5* 7.5* 6.7*  HCT 29.3*   < > 28.4*   < > 26.3*   < > 24.0* 24.4* 24.3* 21.7*  MCV 89.3   < > 87.4  --   88.9  --  89.9  --   --  90.8  PLT 139*   < > 120*  --  111*  --  106*  --   --  94*   < > = values in this interval not displayed.    Medications:    . sodium chloride   Intravenous Once  . atorvastatin  10 mg Oral Daily  . bethanechol  10 mg Oral TID  . Chlorhexidine Gluconate Cloth  6 each Topical Daily  . insulin aspart  0-9 Units Subcutaneous TID AC & HS  . lidocaine  1 patch Transdermal Q24H  . multivitamin with minerals  1 tablet Oral Daily  . pantoprazole  40 mg Oral Daily  . polyethylene glycol  17 g Oral BID  . pregabalin  75 mg Oral QHS  . psyllium  1 packet Oral Daily  . senna-docusate  2 tablet Oral QHS  . sodium chloride flush  10-40 mL Intracatheter Q12H  . traZODone  100 mg Oral QHS     Zettie Cooley, MD [place] Resident, PGY2 02/25/2020, 8:30 AM

## 2020-02-26 ENCOUNTER — Inpatient Hospital Stay (HOSPITAL_COMMUNITY): Payer: PRIVATE HEALTH INSURANCE

## 2020-02-26 DIAGNOSIS — K7030 Alcoholic cirrhosis of liver without ascites: Secondary | ICD-10-CM

## 2020-02-26 DIAGNOSIS — E8770 Fluid overload, unspecified: Secondary | ICD-10-CM

## 2020-02-26 DIAGNOSIS — I824Y3 Acute embolism and thrombosis of unspecified deep veins of proximal lower extremity, bilateral: Secondary | ICD-10-CM

## 2020-02-26 HISTORY — PX: IR REMOVAL TUN CV CATH W/O FL: IMG2289

## 2020-02-26 LAB — CBC
HCT: 23.4 % — ABNORMAL LOW (ref 36.0–46.0)
Hemoglobin: 7.4 g/dL — ABNORMAL LOW (ref 12.0–15.0)
MCH: 27.9 pg (ref 26.0–34.0)
MCHC: 31.6 g/dL (ref 30.0–36.0)
MCV: 88.3 fL (ref 80.0–100.0)
Platelets: 97 10*3/uL — ABNORMAL LOW (ref 150–400)
RBC: 2.65 MIL/uL — ABNORMAL LOW (ref 3.87–5.11)
RDW: 17.1 % — ABNORMAL HIGH (ref 11.5–15.5)
WBC: 9.2 10*3/uL (ref 4.0–10.5)
nRBC: 0 % (ref 0.0–0.2)

## 2020-02-26 LAB — RENAL FUNCTION PANEL
Albumin: 1.9 g/dL — ABNORMAL LOW (ref 3.5–5.0)
Anion gap: 12 (ref 5–15)
BUN: 64 mg/dL — ABNORMAL HIGH (ref 8–23)
CO2: 24 mmol/L (ref 22–32)
Calcium: 8.4 mg/dL — ABNORMAL LOW (ref 8.9–10.3)
Chloride: 101 mmol/L (ref 98–111)
Creatinine, Ser: 5.56 mg/dL — ABNORMAL HIGH (ref 0.44–1.00)
GFR calc Af Amer: 9 mL/min — ABNORMAL LOW (ref >60)
GFR calc non Af Amer: 7 mL/min — ABNORMAL LOW (ref >60)
Glucose, Bld: 126 mg/dL — ABNORMAL HIGH (ref 70–99)
Phosphorus: 4.9 mg/dL — ABNORMAL HIGH (ref 2.5–4.6)
Potassium: 3.9 mmol/L (ref 3.5–5.1)
Sodium: 137 mmol/L (ref 135–145)

## 2020-02-26 LAB — HEPATITIS B SURFACE ANTIGEN: Hepatitis B Surface Ag: NONREACTIVE

## 2020-02-26 LAB — METHYLMALONIC ACID, SERUM: Methylmalonic Acid, Quantitative: 1097 nmol/L — ABNORMAL HIGH (ref 0–378)

## 2020-02-26 LAB — GLUCOSE, CAPILLARY
Glucose-Capillary: 113 mg/dL — ABNORMAL HIGH (ref 70–99)
Glucose-Capillary: 92 mg/dL (ref 70–99)

## 2020-02-26 LAB — HEPATITIS B SURFACE ANTIBODY,QUALITATIVE: Hep B S Ab: NONREACTIVE

## 2020-02-26 LAB — HEPARIN LEVEL (UNFRACTIONATED): Heparin Unfractionated: 0.32 IU/mL (ref 0.30–0.70)

## 2020-02-26 LAB — HEPATITIS B CORE ANTIBODY, TOTAL: Hep B Core Total Ab: NONREACTIVE

## 2020-02-26 MED ORDER — PENTAFLUOROPROP-TETRAFLUOROETH EX AERO: 1.0000 "application " | INHALATION_SPRAY | CUTANEOUS | Status: DC | PRN

## 2020-02-26 MED ORDER — ALTEPLASE 2 MG IJ SOLR: 2.0000 mg | Freq: Once | INTRAMUSCULAR | Status: DC | PRN

## 2020-02-26 MED ORDER — ALBUMIN HUMAN 25 % IV SOLN
INTRAVENOUS | Status: AC
  Administered 2020-02-26: 25 g via INTRAVENOUS
  Filled 2020-02-26: qty 100

## 2020-02-26 MED ORDER — LIDOCAINE HCL (PF) 1 % IJ SOLN: 5.0000 mL | INTRAMUSCULAR | Status: DC | PRN

## 2020-02-26 MED ORDER — ALBUMIN HUMAN 25 % IV SOLN: 25.0000 g | Freq: Once | INTRAVENOUS | Status: AC

## 2020-02-26 MED ORDER — LIDOCAINE-PRILOCAINE 2.5-2.5 % EX CREA: 1.0000 "application " | TOPICAL_CREAM | CUTANEOUS | Status: DC | PRN

## 2020-02-26 MED ORDER — HEPARIN SODIUM (PORCINE) 1000 UNIT/ML DIALYSIS: 1000.0000 [IU] | INTRAMUSCULAR | Status: DC | PRN

## 2020-02-26 MED ORDER — SODIUM CHLORIDE 0.9 % IV SOLN: 100.0000 mL | INTRAVENOUS | Status: DC | PRN

## 2020-02-26 MED ORDER — HEPARIN SODIUM (PORCINE) 1000 UNIT/ML IJ SOLN
INTRAMUSCULAR | Status: AC
  Administered 2020-02-26: 3200 [IU]
  Filled 2020-02-26: qty 4

## 2020-02-26 MED ORDER — SODIUM CHLORIDE 0.9 % IV SOLN
500.0000 mg | INTRAVENOUS | Status: DC
  Filled 2020-02-26: qty 0.5

## 2020-02-26 NOTE — TOC Progression Note (Signed)
Transition of Care [facility]) - Progression Note    Patient Details  Name: [name] [name] MRN: [id number] Date of Birth: 1956/03/29  Transition of Care [facility] CM/SW Contact  Bartholomew Crews, RN Phone Number: [phone] 02/26/2020, 5:26 PM  Clinical Narrative:     Spoke with patient and spouse at the bedside about patient leaving the hospital AMA. Patient wanting to be at home stating that she feels like she is going to die and she wants to be with her family. Patient stated that she does want to continue to be treated medically and is not wanting hospice care, but states that she is agreeable to palliative services. Patient states that she will follow up with her PCP on Monday, and ID on Thursday next week 5/6. AD, AC, and VP at bedside to discuss other options with patient, including moving to another room or another department. Patient adamant that home is where she is going. Advised that when leaving against medical advice the doctor will not provide discharge prescriptions or orders, and insurance may not pay for hospitalization. [facility] notified of patient discharge and that PICC had been removed. IR notified of [place] needing to be removed.   Expected Discharge Plan: [place] Barriers to Discharge: Continued Medical Work up  Expected Discharge Plan and Services Expected Discharge Plan: [place]   Discharge Planning Services: CM Consult Post Acute Care Choice: [place] arrangements for the past 2 months: Apartment                                       Social Determinants of Health (SDOH) Interventions    Readmission Risk Interventions Readmission Risk Prevention Plan 01/23/2020 11/18/2019  Transportation Screening Complete Complete  Medication Review Press photographer) Complete Referral to Pharmacy  PCP or Specialist appointment within 3-5 days of discharge Complete Complete  HRI or Home Care Consult Complete Complete  SW  Recovery Care/Counseling Consult Complete Complete  Palliative Care Screening Complete Not [place] Complete Not Applicable  Some recent data might be hidden

## 2020-02-26 NOTE — Progress Notes (Signed)
Received a call at approximately 4 PM that patient wanted to leave [place] from the nurse.  Called and spoke with the patient's husband and strongly urged against the patient leaving [place].  Explained to him that she may not need long-term dialysis as her kidneys may recover but this will take time.  He stated that the patient wanted to go home and that he was not willing to leave her in the hospital against her will.  I explained to him that I was concerned about her leaving [place] given that she currently is not stable and that we are still monitoring her hemoglobin levels as well as other laboratory work.  Husband stated that he understands that that he is not willing to leave her in the hospital.  I offered to have psychiatry come and speak with the patient, he refused.  I called nephrology and asked Dr. Royce Macadamia to speak with the husband and patient as well.  After this, I called and spoke with the husband again, he is still insistent on taking the patient home [place] and states "you are sending her home to die".  He stated this several times.  I explained that that is not true and that we urged the patient to stay in the hospital and allow [name] to continue her medical care.  I stated several times at the patient does not stable for discharge.  I explained to him that the tunneled hemodialysis catheter would have to be removed prior to discharge.  I also explained to him that when leaving [place] no prescriptions would be offered and no instructions would be offered.   I spoke with IR to have HD catheter removed.   I called and spoke with charge nurse as well as case management. It seems that patient is set set up with home health and ID will follow her antibiotics.   Again,  I strongly urged the patient/husband to stay in the hospital an reiterated that she as not safe for discharge.    Alyannah Sanks D.O. [facility]  If 7PM-7AM, please contact night-coverage [email or web address] 02/26/2020, 5:02 PM

## 2020-02-26 NOTE — Telephone Encounter (Signed)
Patient called office requesting to speak with MD. States she is discharging herself from [facility] against medical advice. Was told picc line would have to be removed today since they cannot provide orders to continue IV meds.  Advised patient and her husband outpatient is not able to help her. Would need to have consult by  ID doctor on call. Will have charge nurse reach out. [place]

## 2020-02-26 NOTE — Progress Notes (Signed)
PT Cancellation Note  Patient Details Name: [name] [name] MRN: [id number] DOB: June 11, 1956   Cancelled Treatment:    Reason Eval/Treat Not Completed: Patient at procedure or test/unavailable (HD). PT will continue to f/u with pt acutely as available.    [place] 02/26/2020, 9:47 AM

## 2020-02-26 NOTE — Progress Notes (Signed)
Management's assistance was requested by bedside RN (REN) due to the patient wanting to leave AMA. This RN reviewed patient chart before entering the room . Pt is currently under antibiotic treatment to due bacteremia, is on a continuous heparin drip and has a tunneled HD catheter which was placed on 02/25/2020 for dialysis treatments. Upon entering the room to speak with the  patient and her husband the patient seemed upset stating " I just want to go home to see my dog". This RN explained to the patient that leaving against medical  advice would not be a safe decision. RN explained the need for dialysis, antibiotics and the safety risk of all these treatments being suddenly withdrawn. Pt stated that she understood, but she felt staff was trying to suffocate her by turning  her heat up too high. This RN apologized for the heat being up too high and turned the heat down , I also offered to provide new staff for the remainder of the shift and continuing days, since pt felt the NT was out to get her. Pt and husband are still adamant on leaving. MD Ree Kida was also called, and she spoke with the patient and husband several of  times explaining to them the risk of them leaving and not being a safe decision.  Harrie Jeans (nephrology) was also called by MD Ree Kida to explain to the family about the risk of her leaving. MD Cecille Rubin foster called into the room while I was present and spoke with patient and  husband about the risk of going and not finish her treatments, and he stated to her that he understood the risk , but they are still going home.  Our case manager Quintella Baton also went to speak to the family regarding her continuing need of her  antibiotics and why she needed to remain in the hospital. PT just kept stating that she wanted to go home and that she would get dialysis outpatient, even though the need for outpatient dialysis has not yet been established nor has the patient been CLIPPED for outpatient dialysis  Center; which was all explained to patient and husband. I then called the [facility] for advice about central lines that were in place. The [facility] along with one of our executive's (Tammy Caviness) also came to educate the patient on the risk of leaving and not continuing her treatments. Tammy Caviness also offered the patient and husband the option   to be moved to another unit, since she was having some discomfort on the current unit, and patient and husband stated that they just wanted to get her home and they were fully aware of the risk, and that they had heard all of this  from several  staff members on the floor, but they are still going home. IR was consulted to remove HD cathter along w/ PICC line, pt tolerated well. Foley was also D/C. AMA papers were signed and placed in the chart.

## 2020-02-26 NOTE — Progress Notes (Signed)
PROGRESS NOTE    [name] [name]  WVP:[id number] DOB: [birth year] DOA: 02/18/2020 PCP: Ann Held, DO   Brief Narrative:  HPI On 02/18/2020 by Dr. Dwyane Dee Patient is transferred from inpatient rehab.  See progress notes as well dated 02/18/2020 from hospitalist, nephrology, and PM&R. Briefly, patient is a 64 year old Caucasian female with PMH CKD3, DMII, PTSD, neuropathy, hypertension, hyperlipidemia, GERD, fatty liver, cirrhosis, asthma, anemia who was recently hospitalized from 01/20/2020 until 01/29/2020, and was then discharged to inpatient rehab.  She was hospitalized for osteomyelitis of L1 and underwent anterolateral corpectomy with PSIF on 01/22/2020 by neurosurgery.  Tissue cultures grew Pseudomonas and she was also followed by ID.  She had initially been on cefepime which was to be completed on 03/05/2020 for a total of 6 weeks of therapy.   She started developing progressive acute on chronic renal failure and was transitioned to meropenem on 4/17 to finish completing her course.  She was also followed by nephrology as well.  She has had adequate urine output and has also remained on twice daily IV Lasix.  She has not met criteria for initiating dialysis at this time.  Suspicion is for AIN due to cefepime. She had also been on a heparin drip for bilateral lower extremity DVT which was diagnosed on 01/30/2020.  Also, she has had a downtrending hemoglobin and has been evaluated by GI (positive hemoccult).  She underwent EGD on 02/18/2020 (she had been declining colonoscopy), her EGD report was unable to be reviewed at time of this note.   She was transferred for ongoing work-up and management by nephrology, hospitalist, GI.  Interim history Anemia with down trending hemoglobin, gastroenterology was consulted, status post EGD on 4/21 which showed mild gastritis.  She had declined colonoscopy.  Patient now with worsening creatinine.  Nephrology consulted.  IR consulted for placement  of tunneled hemodialysis catheter. Assessment & Plan   Acute kidney injury on chronic kidney disease, stage IIIa, progressing -Patient noted to have mild azotemia -History of renal transplant (as per CKD notes, she reports that she had received a kidney transplant at a young age but no physical radiological evidence of this assertion)- however this has been refuted  -Baseline creatinine approximate 1.3-1.5, peaked to 7.47 -Creatinine down to 5.56 today -Unclear etiology -Nephrology consulted and appreciated -Interventional radiology consulted for placement of tunneled hemodialysis catheter -Dialysis started on 4/28, and planned for today  Acute on chronic back pain with left-sided radiculopathy -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  Secondary to medications with anticholinergic side effects such as opiates, diazepam, Atarax -Difficult situation to control her pain -As above, MRI of the thoracic and lumbar spine without any acute findings -" conus and cauda equina appear normal" -Continue indwelling Foley catheter -Continue low-dose bethanechol  Normocytic anemia -Baseline hemoglobin 11-13 prior to previous admission, recently has been 7-9 -FOBT was positive, patient has received 3 units PRBC -No obvious signs of overt bleeding -Suggestive of anemia of chronic disease, B12 is low normal -Patient has declined colonoscopy in the past -Gastroenterology was consulted and appreciated, status post EGD on 421 with mild  gastritis,  no esophageal or fundal varices.  Pathology showed small bowel biopsy negative for increased intraepithelial lymphocytes or villous architectural changes-gastric antral mucosa with chronic gastritis and reactive change, negative stain for H. pylori-esophageal squamous and cardiac mucosa with mild chronic nonspecific cardio esophagitis, negative for intestinal metaplasia or dysplasia -Hemoglobin was down to 6.7 on 4/28 and received one unit PRBC  -Continue to monitor CBC closely- pending labs this morning  Hypervolemia -Suspect secondary to renal failure and IV fluids -Patient did develop respiratory distress and wheezing on 02/14/2020 -BNP elevated -Chest x-ray with pulmonary congestion -No history of CHF -Patient was given IV Lasix however this is now held due to AKI -Respiratory symptoms have resolved  Thrombocytopenia -platelets down to 94- pending labs today -?related to liver cirrhosis -may consider hem/onc consult  Controlled diabetes mellitus, type II -With CKD -Most recent hemoglobin A1c was 12.5 -Continue insulin sliding scale CBG monitor -Continue statin  Recent bilateral lower extremity DVT -Diagnosed on 01/30/2020, was on Xarelto however this was discontinued given bleeding and placed on heparin -if platelets and hemoglobin continues to drop, will consider IVC filter  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 -Acute hepatitis panel and HIV negative -She is not immune to hepatitis B  Chronic left heel ulcer -No signs of infection, appreciate wound care assistance  Possible obstructive sleep -Patient would benefit from outpatient sleep study  Anxiety/depression/insomnia -Could be playing a role in her uncontrolled pain -Continue Cymbalta, trazodone, Valium, Atarax  Depressed mood/goals of care -Patient seems to be frustrated with her long hospital stay and wants to go home soon as possible -It seemed that she thought she was dying after discussion  regarding dialysis on the morning of 02/24/2020  DVT Prophylaxis  Heparin  Code Status: Full  Family Communication: None at bedside  Disposition Plan:  Status is: Inpatient  Remains inpatient appropriate because:Inpatient level of care appropriate due to severity of illness.  Worsening renal failure, will have tunneled hemodialysis catheter placed today.  Continues to have anemia and requiring blood transfusions.   Dispo: The patient is from: CIR              Anticipated d/c is to: Home              Anticipated d/c date is: > 3 days              Patient currently is not medically stable to d/c.   Consultants Nephrology Gastroenterology Infectious disease Interventional radiology  Procedures  EGD 4/21 Anterolateral corpectomy with PSIF for osteomyelitison 01/22/2020 by neurosurgery  Antibiotics   Anti-infectives (From admission, onward)   Start     Dose/Rate Route Frequency Ordered Stop   02/25/20 1630  vancomycin (VANCOREADY) IVPB 500 mg/100 mL     over 60 Minutes  Continuous PRN 02/25/20 1639 02/25/20 1630   02/25/20 1624  vancomycin (VANCOCIN) 1-5 GM/200ML-% IVPB    Note to Pharmacy: Tamsen Snider   : cabinet override      02/25/20 1624 02/26/20 0429   02/18/20 2330  meropenem (MERREM) 1 g in sodium chloride 0.9 % 100 mL IVPB     1 g 200 mL/hr over 30 Minutes Intravenous Every 24 hours 02/18/20 2241        Subjective:   [name] seen and examined today.  Patient tells me that she would like a different room and is afraid that she will catch an infection while in the hospital and wants to go home.  She denies current chest pain or shortness  of breath, abdominal pain, nausea or vomiting, diarrhea constipation, dizziness or headache.    Objective:   Vitals:   02/26/20 0845 02/26/20 0900 02/26/20 0930 02/26/20 1000  BP: (!) 92/45 (!) 103/45 (!) 108/52 (!) 122/44  Pulse: 76 77 81 92  Resp:      Temp:      TempSrc:      SpO2:      Weight:      Height:         Intake/Output Summary (Last 24 hours) at 02/26/2020 1031 Last data filed at 02/26/2020 0900 Gross per 24 hour  Intake 477.19 ml  Output 598 ml  Net -120.81 ml   Filed Weights   02/21/20 2123 02/25/20 2154 02/26/20 0818  Weight: 83.2 kg 83.3 kg 85.2 kg   Exam  General: Well developed, chronically ill-appearing, NAD  HEENT: NCAT, mucous membranes moist.   Cardiovascular: S1 S2 auscultated, RRR, no murmur  Respiratory: Clear to auscultation bilaterally  Abdomen: Soft, nontender, nondistended, + bowel sounds  Extremities: warm dry without cyanosis clubbing or edema  Neuro: AAOx3, nonfocal  Psych: Question patient's judgment and insight  Data Reviewed: I have personally reviewed following labs and imaging studies  CBC: Recent Labs  Lab 02/21/20 0355 02/21/20 0355 02/22/20 0408 02/22/20 0408 02/23/20 0356 02/23/20 0356 02/23/20 1510 02/24/20 0425 02/24/20 0746 02/24/20 2019 02/25/20 0250  WBC 10.4  --  9.4  --  10.6*  --   --  9.6  --   --  9.2  HGB 9.7*   < > 8.8*   < > 8.1*   < > 8.2* 7.3* 7.5* 7.5* 6.7*  HCT 30.3*   < > 28.4*   < > 26.3*   < > 26.3* 24.0* 24.4* 24.3* 21.7*  MCV 87.1  --  87.4  --  88.9  --   --  89.9  --   --  90.8  PLT 133*  --  120*  --  111*  --   --  106*  --   --  94*   < > = values in this interval not displayed.   Basic Metabolic Panel: Recent Labs  Lab 02/21/20 0355 02/21/20 0355 02/22/20 0408 02/23/20 0356 02/24/20 0425 02/25/20 0250 02/26/20 0759  NA 136   < > 137 136 134* 134* 137  K 4.2   < > 4.3 4.0 4.0 4.3 3.9  CL 97*   < > 98 97* 97* 99 101  CO2 28   < > _0 16* 24  GLUCOSE 154*   < > 159* 129* 120* 98 126*  BUN 77*   < > 77* 86* 93* 98* 64*  CREATININE 6.25*   < > 5.89* 6.26* 7.03* 7.47* 5.56*  CALCIUM 8.6*   < > 8.6* 8.7* 8.3* 8.5* 8.4*  MG 2.3  --  2.2 2.3 2.3 2.4  --   PHOS 5.3*   < > 5.4* 5.7* 6.0* 6.9* 4.9*   < > = values in this interval not displayed.   GFR: Estimated Creatinine Clearance (by C-G  formula based on SCr of 5.56 mg/dL (H)) Female: 10.1 mL/min (A) Female: 12.4 mL/min (A) Liver Function Tests: Recent Labs  Lab 02/21/20 0355 02/21/20 0355 02/22/20 0408 02/23/20 0356 02/24/20 0425 02/25/20 0250 02/26/20 0759  AST 16  --  _1 --   ALT 9  --  _2 --   ALKPHOS 103  --  90 86 82  70  --   BILITOT 0.4  --  0.5 0.5 0.5 0.5  --   PROT 7.6  --  7.1 7.1 6.4* 6.1*  --   ALBUMIN 2.1*   < > 2.0* 2.0* 1.9* 1.8* 1.9*   < > = values in this interval not displayed.   No results for input(s): LIPASE, AMYLASE in the last 168 hours. No results for input(s): AMMONIA in the last 168 hours. Coagulation Profile: No results for input(s): INR, PROTIME in the last 168 hours. Cardiac Enzymes: No results for input(s): CKTOTAL, CKMB, CKMBINDEX, TROPONINI in the last 168 hours. BNP (last 3 results) No results for input(s): PROBNP in the last 8760 hours. HbA1C: No results for input(s): HGBA1C in the last 72 hours. CBG: Recent Labs  Lab 02/25/20 0640 02/25/20 1125 02/25/20 1755 02/25/20 2029 02/26/20 0652  GLUCAP 87 78 84 152* 113*   Lipid Profile: No results for input(s): CHOL, HDL, LDLCALC, TRIG, CHOLHDL, LDLDIRECT in the last 72 hours. Thyroid Function Tests: No results for input(s): TSH, T4TOTAL, FREET4, T3FREE, THYROIDAB in the last 72 hours. Anemia Panel: No results for input(s): VITAMINB12, FOLATE, FERRITIN, TIBC, IRON, RETICCTPCT in the last 72 hours. Urine analysis:    Component Value Date/Time   COLORURINE AMBER (A) 02/25/2020 1230   APPEARANCEUR TURBID (A) 02/25/2020 1230   LABSPEC 1.011 02/25/2020 1230   PHURINE 5.0 02/25/2020 1230   GLUCOSEU NEGATIVE 02/25/2020 1230   GLUCOSEU 500 (A) 12/03/2019 1309   HGBUR MODERATE (A) 02/25/2020 1230   HGBUR moderate 08/05/2010 1048   BILIRUBINUR NEGATIVE 02/25/2020 1230   BILIRUBINUR neg 11/15/2017 1241   KETONESUR NEGATIVE 02/25/2020 1230   PROTEINUR 100 (A) 02/25/2020 1230   UROBILINOGEN >=8.0 (A)  12/03/2019 1309   NITRITE NEGATIVE 02/25/2020 1230   LEUKOCYTESUR LARGE (A) 02/25/2020 1230   Sepsis Labs: _0 (procalcitonin:4,lacticidven:4)  ) Recent Results (from the past 240 hour(s))  Surgical pcr screen     Status: Abnormal   Collection Time: 02/25/20  1:48 AM   Specimen: Nasal Mucosa; Nasal Swab  Result Value Ref Range Status   MRSA, PCR NEGATIVE NEGATIVE Final   Staphylococcus aureus POSITIVE (A) NEGATIVE Final    Comment: CRITICAL RESULT CALLED TO, READ BACK BY AND VERIFIED WITH: RN, E. CASTRO [id number] _1  [facility] - Inpatient Performed at [facility] [address] [place],  [address]       Radiology Studies: IR Fluoro Guide CV Line Right  Result Date: 02/25/2020 INDICATION: 64 year old female referred for hemodialysis catheter placement EXAM: TUNNELED CENTRAL VENOUS HEMODIALYSIS CATHETER PLACEMENT WITH ULTRASOUND AND FLUOROSCOPIC GUIDANCE MEDICATIONS: 1 g vancomycin ANESTHESIA/SEDATION: Moderate (conscious) sedation was employed during this procedure. A total of Versed 1.0 mg and Fentanyl 50 mcg was administered intravenously. Moderate Sedation Time: 14 minutes. The patient's level of consciousness and vital signs were monitored continuously by radiology nursing throughout the procedure under my direct supervision. FLUOROSCOPY TIME:  Fluoroscopy Time: 0 minutes 6 seconds (1 mGy). COMPLICATIONS: None PROCEDURE: Informed written consent was obtained from the patient after a discussion of the risks, benefits, and alternatives to treatment. Questions regarding the procedure were encouraged and answered. The right neck and chest were prepped with chlorhexidine in a sterile fashion, and a sterile drape was applied covering the operative field. Maximum barrier sterile technique with sterile gowns and gloves were used for the procedure. A timeout was performed prior to the initiation of the procedure. After creating a small venotomy incision, a micropuncture kit was utilized to  access the right internal jugular  vein under direct, real-time ultrasound guidance after the overlying soft tissues were anesthetized with 1% lidocaine with epinephrine. Ultrasound image documentation was performed. The microwire was marked to measure appropriate internal catheter length. External tunneled length was estimated. A total tip to cuff length of 19 cm was selected. Skin and subcutaneous tissues of chest wall below the clavicle were generously infiltrated with 1% lidocaine for local anesthesia. A small stab incision was made with 11 blade scalpel. The selected hemodialysis catheter was tunneled in a retrograde fashion from the anterior chest wall to the venotomy incision. A guidewire was advanced to the level of the IVC and the micropuncture sheath was exchanged for a peel-away sheath. The catheter was then placed through the peel-away sheath with tips ultimately positioned within the superior aspect of the right atrium. Final catheter positioning was confirmed and documented with a spot radiographic image. The catheter aspirates and flushes normally. The catheter was flushed with appropriate volume heparin dwells. Tract was embolized with Gel-Foam slurry. The catheter exit site was secured with a 0-Prolene retention suture. The venotomy incision was closed Derma bond and sterile dressing. Dressings were applied at the chest wall. Patient tolerated the procedure well and remained hemodynamically stable throughout. No complications were encountered and no significant blood loss encountered. IMPRESSION: Status post right IJ tunneled hemodialysis catheter. Signed, Dulcy Fanny. Dellia Nims, RPVI [facility] Radiology Electronically Signed   By: Corrie Mckusick D.O.   On: 02/25/2020 17:28   IR US Guide Vasc Access Right  Result Date: 02/25/2020 INDICATION: 64 year old female referred for hemodialysis catheter placement EXAM: TUNNELED CENTRAL VENOUS HEMODIALYSIS  CATHETER PLACEMENT WITH ULTRASOUND AND FLUOROSCOPIC GUIDANCE MEDICATIONS: 1 g vancomycin ANESTHESIA/SEDATION: Moderate (conscious) sedation was employed during this procedure. A total of Versed 1.0 mg and Fentanyl 50 mcg was administered intravenously. Moderate Sedation Time: 14 minutes. The patient's level of consciousness and vital signs were monitored continuously by radiology nursing throughout the procedure under my direct supervision. FLUOROSCOPY TIME:  Fluoroscopy Time: 0 minutes 6 seconds (1 mGy). COMPLICATIONS: None PROCEDURE: Informed written consent was obtained from the patient after a discussion of the risks, benefits, and alternatives to treatment. Questions regarding the procedure were encouraged and answered. The right neck and chest were prepped with chlorhexidine in a sterile fashion, and a sterile drape was applied covering the operative field. Maximum barrier sterile technique with sterile gowns and gloves were used for the procedure. A timeout was performed prior to the initiation of the procedure. After creating a small venotomy incision, a micropuncture kit was utilized to access the right internal jugular vein under direct, real-time ultrasound guidance after the overlying soft tissues were anesthetized with 1% lidocaine with epinephrine. Ultrasound image documentation was performed. The microwire was marked to measure appropriate internal catheter length. External tunneled length was estimated. A total tip to cuff length of 19 cm was selected. Skin and subcutaneous tissues of chest wall below the clavicle were generously infiltrated with 1% lidocaine for local anesthesia. A small stab incision was made with 11 blade scalpel. The selected hemodialysis catheter was tunneled in a retrograde fashion from the anterior chest wall to the venotomy incision. A guidewire was advanced to the level of the IVC and the micropuncture sheath was exchanged for a peel-away sheath. The catheter was then placed  through the peel-away sheath with tips ultimately positioned within the superior aspect of the right atrium. Final catheter positioning was confirmed and documented with a spot radiographic image. The catheter aspirates and  flushes normally. The catheter was flushed with appropriate volume heparin dwells. Tract was embolized with Gel-Foam slurry. The catheter exit site was secured with a 0-Prolene retention suture. The venotomy incision was closed Derma bond and sterile dressing. Dressings were applied at the chest wall. Patient tolerated the procedure well and remained hemodynamically stable throughout. No complications were encountered and no significant blood loss encountered. IMPRESSION: Status post right IJ tunneled hemodialysis catheter. Signed, Dulcy Fanny. Dellia Nims, RPVI [facility] Inter[facility] Radiology Special[facility] Radiology Electronically Signed   By: Corrie Mckusick D.O.   On: 02/25/2020 17:28     Scheduled Meds: . sodium chloride   Intravenous Once  . atorvastatin  10 mg Oral Daily  . bethanechol  10 mg Oral TID  . Chlorhexidine Gluconate Cloth  6 each Topical Daily  . Chlorhexidine Gluconate Cloth  6 each Topical Q0600  . heparin sodium (porcine)      . heparin sodium (porcine)      . insulin aspart  0-9 Units Subcutaneous TID AC & HS  . lidocaine  1 patch Transdermal Q24H  . multivitamin with minerals  1 tablet Oral Daily  . pantoprazole  40 mg Oral Daily  . polyethylene glycol  17 g Oral BID  . pregabalin  75 mg Oral QHS  . psyllium  1 packet Oral Daily  . senna-docusate  2 tablet Oral QHS  . sodium chloride flush  10-40 mL Intracatheter Q12H  . traZODone  100 mg Oral QHS   Continuous Infusions: . sodium chloride    . sodium chloride    . heparin 1,350 Units/hr (02/25/20 1137)  . meropenem (MERREM) IV 1 g (02/26/20 0058)     LOS: 8 days   Time Spent in minutes   30 minutes  Signe Tackitt D.O. on 02/26/2020 at 10:31 AM  Between 7am to 7pm - Please see  pager noted on [email or web address]  After 7pm go to [email or web address]  And look for the night coverage person covering for me after hours  [facility] Office  [phone]

## 2020-02-26 NOTE — Procedures (Signed)
Seen and examined on dialysis.  Blood pressure 97/47. RIJ tunneled catheter.  Reduced goal by 250 mL and gave bp parameter to keep 419 systolic or greater for today.  Albumin 25 gram IV once given.   [name], MD 02/26/2020 9:33 AM

## 2020-02-26 NOTE — TOC Progression Note (Signed)
Transition of Care [facility]) - Progression Note    Patient Details  Name: Eisa Conaway MRN: [id number] Date of Birth: 12-05-1955  Transition of Care [facility]) CM/SW Contact  Bartholomew Crews, RN Phone Number: [phone] 02/26/2020, 3:24 PM  Clinical Narrative:     Received call from Waldron Session at [place]. Clinical notes requested to work on establishing patient for home care once she discharges home. Faxed clinical notes to [phone]. TOC following for transition needs.   Expected Discharge Plan: [place] Barriers to Discharge: Continued Medical Work up  Expected Discharge Plan and Services Expected Discharge Plan: [place]   Discharge Planning Services: CM Consult Post Acute Care Choice: [place] arrangements for the past 2 months: Apartment                                       Social Determinants of Health (SDOH) Interventions    Readmission Risk Interventions Readmission Risk Prevention Plan 01/23/2020 11/18/2019  Transportation Screening Complete Complete  Medication Review Press photographer) Complete Referral to Pharmacy  PCP or Specialist appointment within 3-5 days of discharge Complete Complete  HRI or Home Care Consult Complete Complete  SW Recovery Care/Counseling Consult Complete Complete  Palliative Care Screening Complete Not [place] Complete Not Applicable  Some recent data might be hidden

## 2020-02-26 NOTE — Progress Notes (Signed)
ANTICOAGULATION CONSULT NOTE - Follow Up Consult  Pharmacy Consult for Heparin Indication: DVT  Allergies  Allergen Reactions  . Bee Pollen Anaphylaxis  . Fish-Derived Products Hives, Shortness Of Breath, Swelling and Rash    Hives get in throat causing trouble breathing  . Mushroom Extract Complex Anaphylaxis  . Penicillins Anaphylaxis    **Tolerated cefepime March 2021 Did it involve swelling of the face/tongue/throat, SOB, or low BP? Yes Did it involve sudden or severe rash/hives, skin peeling, or any reaction on the inside of your mouth or nose? No Did you need to seek medical attention at a hospital or doctor's office? Yes When did it last happen?A few months ago If all above answers are "NO", may proceed with cephalosporin use.  [name] Kitchen Rosemary Oil Anaphylaxis  . Shellfish Allergy Hives, Shortness Of Breath, Swelling and Rash  . Tomato Hives and Shortness Of Breath    Hives in throat causes her trouble breathing  . Acetaminophen Other (See Comments)    GI upset  . Acyclovir And Related Other (See Comments)    Unknown rxn  . Aloe Vera Hives  . Broccoli [Brassica Oleracea] Hives  . Naproxen Other (See Comments)    Unknown rxn    Patient Measurements: Height: 5' 1"  (154.9 cm)(from 01/20/20 records) Weight: 85.2 kg (187 lb 13.3 oz) IBW/kg (Calculated) : 47.8 Heparin Dosing Weight:    Vital Signs: Temp: 97.8 F (36.6 C) (04/29 0818) Temp Source: Oral (04/29 0818) BP: 108/52 (04/29 0930) Pulse Rate: 81 (04/29 0930)  Labs: Recent Labs    02/24/20 0425 02/24/20 0425 02/24/20 0746 02/24/20 0746 02/24/20 2019 02/25/20 0250 02/26/20 0759 02/26/20 0905  HGB 7.3*   < > 7.5*   < > 7.5* 6.7*  --   --   HCT 24.0*   < > 24.4*  --  24.3* 21.7*  --   --   PLT 106*  --   --   --   --  94*  --   --   HEPARINUNFRC 0.42  --   --   --   --  0.45  --  0.32  CREATININE 7.03*  --   --   --   --  7.47* 5.56*  --    < > = values in this interval not displayed.    Estimated  Creatinine Clearance (by C-G formula based on SCr of 5.56 mg/dL (H)) Female: 10.1 mL/min (A) Female: 12.4 mL/min (A)   Assessment:  Anticoag: lovenox 40>>Xarelto for new DVT. Changed to IV heparin for now due to anemia. Prob not HIT, 4T score 3. Transfused 4/17. Xarelto 42m BID started 4/2>heparin 4/14 - 4/2: LE dopplers - right/left acute DVT   Heparin drip off ~1700 yesterday for HD catheter and restarted ~1900 per RN. HL this AM delayed until HD. HL 0.32 (in range). Per lab tech, Hgb 7.4, and PLT 98, however, results will need to be reverified.      Goal of Therapy:  Heparin level 0.3-0.7 units/ml Monitor platelets by anticoagulation protocol: Yes   Plan:  Con't IV heparin at 1350 units/hr Daily HL and CBC    Ikhlas Albo A. PLevada Dy PharmD, BCPS, FNKF Clinical Pharmacist [place] Please utilize Amion for appropriate phone number to reach the unit pharmacist ([place]   02/26/2020,10:08 AM

## 2020-02-26 NOTE — Procedures (Signed)
Patient requesting to leave AMA - primary team requesting removal of tunneled HD catheter placed 02/25/20 in IR and PICC line placed by IV team.  Pre procedural Dx: AKI Post procedural Dx: Same  Successful removal of tunneled HD catheter.  Hemostasis achieved after approximately 10 minutes of manual pressure. Quickclot + 4x4 gauze + tape applied - dressing to remain x 24H, keep clean/dry/dressed until healed.   RUE PICC also removed at bedside today without complication. Dressing applied.   EBL: None No immediate complications.  Please see imaging section of Epic for full dictation.  Joaquim Nam PA-C 02/26/2020 5:12 PM

## 2020-02-26 NOTE — Progress Notes (Signed)
Plan of care note nephrology.  Spoke with the patient's husband via her room phone after I was contacted by the hospitalist who indicated that the patient wanted to leave AMA. 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  She also has acute anemia which required a blood transfusion yesterday.    I strongly recommended that she stay.  Harrie Jeans, MD

## 2020-02-26 NOTE — Discharge Summary (Signed)
Physician Discharge Summary  [name] [name] DUK:[id number] DOB: 1956/05/28 DOA: 02/18/2020  PCP: Ann Held, DO  Admit date: 02/18/2020 Discharge date: 02/27/2020  Time spent: 45 minutes  Recommendations for Outpatient Follow-up:  None, patient left AGAINST MEDICAL ADVICE.    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 Dwyane Dee Patient istransferred from inpatient rehab. See progress notes as well dated 02/18/2020 from hospitalist, nephrology, and PM&R. Briefly,patient is a 64 year old Caucasian femalewith PMH CKD3, DMII, PTSD,neuropathy, hypertension, hyperlipidemia, GERD, fatty liver, cirrhosis, asthma, anemia who was recently hospitalized from 01/20/2020 until 01/29/2020,and was then discharged to inpatient rehab. She was hospitalized for osteomyelitis of L1 and underwent anterolateral corpectomy with PSIF on 01/22/2020 by neurosurgery. Tissue cultures grew Pseudomonas and she was also followed by ID. She had initially been on cefepime which was to be completed on 03/05/2020 for a total of 6 weeks of therapy.  She started developing progressiveacute on chronic renal failureand was transitioned to meropenem on 4/17to finish completing her course. She was also followed by nephrology as well. She has had adequate urine output and has also remained on twice  daily IV Lasix. She has not met criteria for initiating dialysis at this time. Suspicion is for AIN due to cefepime. She had also been on a heparin drip for bilateral lower extremity DVT which was diagnosed on 01/30/2020.  Also, she has had a downtrending hemoglobin and has been evaluated by GI(positive hemoccult). She underwent EGD on 02/18/2020 (she had been declining colonoscopy), her EGD report was unable to be reviewed at time of this note.  She was transferred for ongoing work-up and management by nephrology, hospitalist, GI.  Hospital Course:  Acute kidney injury on chronic kidney disease, stage IIIa, progressing -Patient noted to have mild azotemia -History of renal transplant (as per CKD notes, she reports that she had received a kidney transplant at a young age but no physical radiological evidence of this assertion)- however this has been refuted  -Baseline creatinine approximate 1.3-1.5, peaked to 7.47 -Creatinine down to 5.56 today -Unclear etiology -Nephrology consulted and appreciated -Interventional radiology consulted for placement of tunneled hemodialysis catheter -Dialysis started on 4/28 and dialyzed today  Acute on chronic back pain with left-sided radiculopathy -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  Secondary to medications with anticholinergic side effects such as opiates, diazepam, Atarax -Difficult situation to control her pain -As above, MRI of the thoracic and lumbar spine without any acute  findings -" conus and cauda equina appear normal" -Continue indwelling Foley catheter -Continue low-dose bethanechol  Normocytic anemia -Baseline hemoglobin 11-13 prior to previous admission, recently has been 7-9 -  FOBT was positive, patient has received 3 units PRBC -No obvious signs of overt bleeding -Suggestive of anemia of chronic disease, B12 is low normal -Patient has declined colonoscopy in the past -Gastroenterology was consulted and appreciated, status post EGD on 421 with mild gastritis, no esophageal or fundal varices.  Pathology showed small bowel biopsy negative for increased intraepithelial lymphocytes or villous architectural changes-gastric antral mucosa with chronic gastritis and reactive change, negative stain for H. pylori-esophageal squamous and cardiac mucosa with mild chronic nonspecific cardio esophagitis, negative for intestinal metaplasia or dysplasia -Hemoglobin was down to 6.7 on 4/28 and received one unit PRBC  -Continue to monitor CBC closely- pending labs this morning  Hypervolemia -Suspect secondary to renal failure and IV fluids -Patient did develop respiratory distress and wheezing on 02/14/2020 -BNP elevated -Chest x-ray with pulmonary congestion -No history of CHF -Patient was given IV Lasix however this is now held due to AKI -Respiratory symptoms have resolved  Thrombocytopenia -platelets down to 94- pending labs today -?related to liver cirrhosis -may consider hem/onc consult  Controlled diabetes mellitus, type II -With CKD -Most recent hemoglobin A1c was 12.5 -Continue insulin sliding scale CBG monitor -Continue statin  Recent bilateral lower extremity DVT -Diagnosed on 01/30/2020, was on Xarelto however this was discontinued given bleeding and placed on heparin -if platelets and hemoglobin continues to drop, will consider IVC filter  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 -Acute hepatitis panel and HIV negative -She is not immune  to hepatitis B  Chronic left heel ulcer -No signs of infection, appreciate wound care assistance  Possible obstructive sleep -Patient would benefit from outpatient sleep study  Anxiety/depression/insomnia -Could be playing a role in her uncontrolled pain -Continue Cymbalta, trazodone, Valium, Atarax  Depressed mood/goals of care -Patient seems to be frustrated with her long hospital stay and wants to go home soon as possible -It seemed that she thought she was dying after discussion regarding dialysis on the morning of 02/24/2020 -Had long discussion with patient this morning and urged her to stay in the hospital. I offered to have her moved to and she refused and stated she wanted to go home. -Discussed with husband in the afternoon, he was adamant about taking his wife home and he was not going to leave her in the hospital against her will.   -I strongly urged them to stay and explained that the patient was not safe or stable for discharge. I offered to have psychiatry come and see the patient, husband refused.  Consultants Nephrology Gastroenterology Infectious disease Interventional radiology  Procedures  EGD 4/21 Anterolateral corpectomy with PSIF for osteomyelitison 01/22/2020 by neurosurgery Tunneled HD placement - placed and removed by IR  Discharge Exam: Vitals:   02/26/20 1040 02/26/20 1054  BP: (!) 120/55 (!) 123/58  Pulse: 86 87  Resp:  20  Temp:  98.4 F (36.9 C)  SpO2:  92%    Discharge Instructions  Allergies as of 02/26/2020      Reactions   Bee Pollen Anaphylaxis   Fish-derived Products Hives, Shortness Of Breath, Swelling, Rash   Hives get in throat causing trouble breathing   Mushroom Extract Complex Anaphylaxis   Penicillins Anaphylaxis   **Tolerated cefepime March 2021 Did it involve swelling of the face/tongue/throat, SOB, or low BP? Yes Did it involve sudden or severe rash/hives, skin peeling, or any reaction on the inside of your mouth or  nose? No Did you need to seek medical attention at a hospital or doctor's office? Yes When did it  last happen?A few months ago If all above answers are "NO", may proceed with cephalosporin use.   Rosemary Oil Anaphylaxis   Shellfish Allergy Hives, Shortness Of Breath, Swelling, Rash   Tomato Hives, Shortness Of Breath   Hives in throat causes her trouble breathing   Acetaminophen Other (See Comments)   GI upset   Acyclovir And Related Other (See Comments)   Unknown rxn   Aloe Vera Hives   Broccoli [brassica Oleracea] Hives   Naproxen Other (See Comments)   Unknown rxn      Medication List    ASK your doctor about these medications   acetaminophen 325 MG tablet Commonly known as: TYLENOL Take 2 tablets (650 mg total) by mouth every 6 (six) hours as needed for fever.   atorvastatin 10 MG tablet Commonly known as: LIPITOR Take 1 tablet by mouth once daily   bethanechol 10 MG tablet Commonly known as: URECHOLINE Take 1 tablet (10 mg total) by mouth 3 (three) times daily.   diazepam 5 MG tablet Commonly known as: VALIUM Take 1 tablet (5 mg total) by mouth every 12 (twelve) hours as needed for muscle spasms.   furosemide 10 MG/ML injection Commonly known as: LASIX Inject 8 mLs (80 mg total) into the vein 2 (two) times daily.   heparin 25000-0.45 UT/250ML-% infusion Inject 1,200 Units/hr into the vein continuous.   insulin aspart 100 UNIT/ML injection Commonly known as: novoLOG Inject 0-5 Units into the skin at bedtime.   insulin aspart 100 UNIT/ML injection Commonly known as: novoLOG Inject 0-9 Units into the skin 3 (three) times daily with meals.   lidocaine 5 % Commonly known as: Lidoderm Place 1 patch onto the skin daily. Remove & Discard patch within 12 hours or as directed by MD   meropenem  IVPB Commonly known as: MERREM Inject 1 g into the vein every 12 (twelve) hours for 20 days. Indication:  Hardware Associated Psuedomonal Vertebral Osteomyelitis  Last  Day of Therapy:  03/05/20 Labs - Once weekly:  CBC/D and BMP, Labs - Every other week:  ESR and CRP   meropenem 1 g in sodium chloride 0.9 % 100 mL Inject 1 g into the vein daily.   multivitamin with minerals Tabs tablet Take 1 tablet by mouth daily.   oxyCODONE 5 MG immediate release tablet Commonly known as: Oxy IR/ROXICODONE Take 1 tablet (5 mg total) by mouth every 3 (three) hours as needed for moderate pain ((score 4 to 6)).   pantoprazole 40 MG tablet Commonly known as: PROTONIX Take 1 tablet (40 mg total) by mouth daily at 6 (six) AM.   pregabalin 75 MG capsule Commonly known as: LYRICA Take 1 capsule (75 mg total) by mouth at bedtime.   traZODone 50 MG tablet Commonly known as: DESYREL Take 1 tablet (50 mg total) by mouth at bedtime.      Allergies  Allergen Reactions  . Bee Pollen Anaphylaxis  . Fish-Derived Products Hives, Shortness Of Breath, Swelling and Rash    Hives get in throat causing trouble breathing  . Mushroom Extract Complex Anaphylaxis  . Penicillins Anaphylaxis    **Tolerated cefepime March 2021 Did it involve swelling of the face/tongue/throat, SOB, or low BP? Yes Did it involve sudden or severe rash/hives, skin peeling, or any reaction on the inside of your mouth or nose? No Did you need to seek medical attention at a hospital or doctor's office? Yes When did it last happen?A few months ago If all above answers are "NO",  may proceed with cephalosporin use.  [name] Kitchen Rosemary Oil Anaphylaxis  . Shellfish Allergy Hives, Shortness Of Breath, Swelling and Rash  . Tomato Hives and Shortness Of Breath    Hives in throat causes her trouble breathing  . Acetaminophen Other (See Comments)    GI upset  . Acyclovir And Related Other (See Comments)    Unknown rxn  . Aloe Vera Hives  . Broccoli [Brassica Oleracea] Hives  . Naproxen Other (See Comments)    Unknown rxn      The results of significant diagnostics from this hospitalization (including  imaging, microbiology, ancillary and laboratory) are listed below for reference.    Significant Diagnostic Studies: CT ABDOMEN PELVIS WO CONTRAST  Result Date: 02/11/2020 CLINICAL DATA:  64 year old female with pathologic L1 compression fracture last month with abnormal adjacent T12 and L2 segments, treated with operative corpectomy and posterior fusion, with cultures positive for Pseudomonas. Low back pain, query improvement/hematoma. EXAM: CT ABDOMEN AND PELVIS WITHOUT CONTRAST TECHNIQUE: Multidetector CT imaging of the abdomen and pelvis was performed following the standard protocol without IV contrast. COMPARISON:  Preoperative thoracic and lumbar MRI 01/20/2020 and CT Abdomen and Pelvis that day. FINDINGS: Lower chest: Increased small bilateral pleural effusions with mild lung base atelectasis. No pericardial effusion. Calcified coronary artery atherosclerosis and/or stents. Hepatobiliary: Nodular liver contour suggesting cirrhosis (series 3, image 28). Surgically absent gallbladder. Scattered small calcified granulomas in the liver with no other discrete liver lesion in the absence of IV contrast. Pancreas: Negative. Spleen: Stable mild splenomegaly. Occasional calcified splenic granulomas. Adrenals/Urinary Tract: Normal adrenal glands. Noncontrast kidneys appear stable with no hydronephrosis or hydroureter. The urinary bladder is distended, estimated bladder volume 642 mL. No perivesical stranding. No urinary calculus identified. Stomach/Bowel: No dilated large or small bowel. Redundant large bowel with retained stool throughout, although not significantly changed from last month. Normal retrocecal appendix on series 3, image 73. Negative stomach. No free air. No free fluid. Vascular/Lymphatic: Vascular patency is not evaluated in the absence of IV contrast. Aortoiliac calcified atherosclerosis. Stable mild reactive appearing retroperitoneal lymph nodes. Reproductive: Surgically absent uterus and  diminutive or absent ovaries. Other: No pelvic free fluid. Mild generalized increased flank subcutaneous edema. Musculoskeletal: Sequelae of L1 corpectomy and posterior fusion hardware T11 through L3. Mild erosion of the adjacent T12 and L2 endplates appears stable from the preoperative MRI. No adverse hardware features. There is mild paraspinal soft tissue stranding, and some left psoas muscle gas at the corpectomy level. No discrete postoperative hematoma. But there is new T11 superior endplate compression on series 6, image 13. The T11 pedicle screws do closely approximate the superior endplate. Underlying osteopenia. No other new osseous abnormality. IMPRESSION: 1. Sequelae of L1 corpectomy and posterior fusion with hardware T11 through L3. No hardware loosening identified, but there is a new mild T11 superior endplate compression fracture. Mild erosion of the T12 and L2 endplates appears stable from the preoperative MRI. Mild lumbar paraspinal inflammation with no discrete postoperative hematoma. 2. Increased small bilateral pleural effusions with mild lung base atelectasis. 3. Cirrhosis with mild splenomegaly. 4. Distended urinary bladder, estimated at 642 mL. 5. Aortic Atherosclerosis (ICD10-I70.0). Electronically Signed   By: Genevie Ann M.D.   On: 02/11/2020 13:56   CT HEAD WO CONTRAST  Result Date: 02/03/2020 CLINICAL DATA:  Head trauma, fall EXAM: CT HEAD WITHOUT CONTRAST TECHNIQUE: Contiguous axial images were obtained from the base of the skull through the vertex without intravenous contrast. COMPARISON:  11/12/2019 FINDINGS: Brain: No evidence of acute  infarction, hemorrhage, hydrocephalus, extra-axial collection or mass lesion/mass effect. Vascular: No hyperdense vessel or unexpected calcification. Skull: Normal. Negative for fracture or focal lesion. Sinuses/Orbits: No acute finding. Other: None. IMPRESSION: No acute intracranial pathology. Electronically Signed   By: Eddie Candle M.D.   On:  02/03/2020 10:38   MR BRAIN WO CONTRAST  Result Date: 02/04/2020 CLINICAL DATA:  Delirium, fall with headache EXAM: MRI HEAD WITHOUT CONTRAST TECHNIQUE: Multiplanar, multiecho pulse sequences of the brain and surrounding structures were obtained without intravenous contrast. COMPARISON:  2015 FINDINGS: Brain: There is no acute infarction or intracranial hemorrhage. There is no parenchymal mass. 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 Ventricles are normal in size and configuration. There is no edema, hydrocephalus, or extra-axial fluid collection. Minimal small foci of T2 hyperintensity in the supratentorial white matter are nonspecific but may reflect minor chronic microvascular ischemic changes. Vascular: Major vessel flow voids at the skull base are preserved. Skull and upper cervical spine: Normal marrow signal is preserved. Sinuses/Orbits: Mild ethmoid mucosal thickening. Left lens replacement. Other: Sella is unremarkable. Mild patchy mastoid fluid opacification. Degenerative changes of the temporomandibular joints. IMPRESSION: No acute infarction, hemorrhage, or mass. Stable chronic findings detailed above. Electronically Signed   By: Macy Mis M.D.   On: 02/04/2020 11:08   MR THORACIC SPINE WO CONTRAST  Result Date: 02/12/2020 CLINICAL DATA:  Thoracic compression fracture. Osteomyelitis. EXAM: MRI THORACIC AND LUMBAR SPINE WITHOUT CONTRAST TECHNIQUE: Multiplanar and multiecho pulse sequences of the thoracic and lumbar spine were obtained without intravenous contrast. COMPARISON:  None. FINDINGS: MRI THORACIC SPINE FINDINGS Alignment:  Normal Vertebrae: Posterior instrumented spinal fusion beginning at T11. Vertebrae are otherwise normal. Cord:  Normal Paraspinal and other soft tissues: Bilateral pleural effusions Disc levels: No spinal canal or neural foraminal stenosis. MRI LUMBAR SPINE FINDINGS Segmentation:  Standard. Alignment:  Physiologic.  Vertebrae: Susceptibility artifact from spinal hardware obscures the T12-L2 levels and to a lesser extent the T11 and L3 levels. No visible bone marrow abnormality. Conus medullaris and cauda equina: Conus extends to the L1 level. Conus and cauda equina appear normal. Paraspinal and other soft tissues: There is edema within the posterior soft tissues. No abscess. Disc levels: Assessment is limited by susceptibility effects and mild motion. L1-2: No spinal canal stenosis. Neural foramina are obscured. L2-3: Widely patent spinal. No neural foraminal stenosis. L3-4: No spinal canal stenosis. Mild left foraminal stenosis due to asymmetric disc bulge. L4-5: Right asymmetric disc bulge with narrowing of the right lateral recess. No neural foraminal stenosis. L5-S1: No spinal canal or neural foraminal stenosis. IMPRESSION: 1. Posterior instrumented spinal fusion at T11-L3 with corpectomy at L1. Associated susceptibility artifact that obscures the T12-L2 level and to a lesser extent T11-12 levels. Within that limitation, no visible evidence of discitis-osteomyelitis. 2. No thoracic or lumbar spinal canal stenosis. 3. Nonspecific edema within the posterior soft tissues. No abscess. 4. Bilateral pleural effusions. Electronically Signed   By: Ulyses Jarred M.D.   On: 02/12/2020 21:08   MR LUMBAR SPINE WO CONTRAST  Result Date: 02/12/2020 CLINICAL DATA:  Thoracic compression fracture. Osteomyelitis. EXAM: MRI THORACIC AND LUMBAR SPINE WITHOUT CONTRAST TECHNIQUE: Multiplanar and multiecho pulse sequences of the thoracic and lumbar spine were obtained without intravenous contrast. COMPARISON:  None. FINDINGS: MRI THORACIC SPINE FINDINGS Alignment:  Normal Vertebrae: Posterior instrumented spinal fusion beginning at T11. Vertebrae are otherwise normal. Cord:  Normal Paraspinal and other soft tissues: Bilateral pleural effusions Disc levels: No spinal canal or neural  foraminal stenosis. MRI LUMBAR SPINE FINDINGS Segmentation:   Standard. Alignment:  Physiologic. Vertebrae: Susceptibility artifact from spinal hardware obscures the T12-L2 levels and to a lesser extent the T11 and L3 levels. No visible bone marrow abnormality. Conus medullaris and cauda equina: Conus extends to the L1 level. Conus and cauda equina appear normal. Paraspinal and other soft tissues: There is edema within the posterior soft tissues. No abscess. Disc levels: Assessment is limited by susceptibility effects and mild motion. L1-2: No spinal canal stenosis. Neural foramina are obscured. L2-3: Widely patent spinal. No neural foraminal stenosis. L3-4: No spinal canal stenosis. Mild left foraminal stenosis due to asymmetric disc bulge. L4-5: Right asymmetric disc bulge with narrowing of the right lateral recess. No neural foraminal stenosis. L5-S1: No spinal canal or neural foraminal stenosis. IMPRESSION: 1. Posterior instrumented spinal fusion at T11-L3 with corpectomy at L1. Associated susceptibility artifact that obscures the T12-L2 level and to a lesser extent T11-12 levels. Within that limitation, no visible evidence of discitis-osteomyelitis. 2. No thoracic or lumbar spinal canal stenosis. 3. Nonspecific edema within the posterior soft tissues. No abscess. 4. Bilateral pleural effusions. Electronically Signed   By: Ulyses Jarred M.D.   On: 02/12/2020 21:08   US RENAL  Result Date: 02/11/2020 CLINICAL DATA:  Urinary retention EXAM: RENAL / URINARY TRACT ULTRASOUND COMPLETE COMPARISON:  CT 02/11/2020, ultrasound 04/16/2019 FINDINGS: Right Kidney: Renal measurements: 10.6 x 5.1 x 4.8 cm = volume: 135 mL . Echogenicity within normal limits. No mass or hydronephrosis visualized. Left Kidney: Renal measurements: 11.0 x 4.8 x 4.8 cm = volume: 132 mL. Echogenicity within normal limits. No mass or hydronephrosis visualized. Bladder: Moderately distended, but appears otherwise unremarkable. Other: Heterogeneous appearance of the visualized portion of the liver,  compatible with known cirrhosis. IMPRESSION: 1. Moderately distended urinary bladder. 2. Unremarkable kidneys without hydronephrosis. 3. Partially visualized cirrhotic liver. Electronically Signed   By: Davina Poke D.O.   On: 02/11/2020 14:20   IR Fluoro Guide CV Line Right  Result Date: 02/25/2020 INDICATION: 64 year old female referred for hemodialysis catheter placement EXAM: TUNNELED CENTRAL VENOUS HEMODIALYSIS CATHETER PLACEMENT WITH ULTRASOUND AND FLUOROSCOPIC GUIDANCE MEDICATIONS: 1 g vancomycin ANESTHESIA/SEDATION: Moderate (conscious) sedation was employed during this procedure. A total of Versed 1.0 mg and Fentanyl 50 mcg was administered intravenously. Moderate Sedation Time: 14 minutes. The patient's level of consciousness and vital signs were monitored continuously by radiology nursing throughout the procedure under my direct supervision. FLUOROSCOPY TIME:  Fluoroscopy Time: 0 minutes 6 seconds (1 mGy). COMPLICATIONS: None PROCEDURE: Informed written consent was obtained from the patient after a discussion of the risks, benefits, and alternatives to treatment. Questions regarding the procedure were encouraged and answered. The right neck and chest were prepped with chlorhexidine in a sterile fashion, and a sterile drape was applied covering the operative field. Maximum barrier sterile technique with sterile gowns and gloves were used for the procedure. A timeout was performed prior to the initiation of the procedure. After creating a small venotomy incision, a micropuncture kit was utilized to access the right internal jugular vein under direct, real-time ultrasound guidance after the overlying soft tissues were anesthetized with 1% lidocaine with epinephrine. Ultrasound image documentation was performed. The microwire was marked to measure appropriate internal catheter length. External tunneled length was estimated. A total tip to cuff length of 19 cm was selected. Skin and subcutaneous  tissues of chest wall below the clavicle were generously infiltrated with 1% lidocaine for local anesthesia. A small stab incision was made with 11  blade scalpel. The selected hemodialysis catheter was tunneled in a retrograde fashion from the anterior chest wall to the venotomy incision. A guidewire was advanced to the level of the IVC and the micropuncture sheath was exchanged for a peel-away sheath. The catheter was then placed through the peel-away sheath with tips ultimately positioned within the superior aspect of the right atrium. Final catheter positioning was confirmed and documented with a spot radiographic image. The catheter aspirates and flushes normally. The catheter was flushed with appropriate volume heparin dwells. Tract was embolized with Gel-Foam slurry. The catheter exit site was secured with a 0-Prolene retention suture. The venotomy incision was closed Derma bond and sterile dressing. Dressings were applied at the chest wall. Patient tolerated the procedure well and remained hemodynamically stable throughout. No complications were encountered and no significant blood loss encountered. IMPRESSION: Status post right IJ tunneled hemodialysis catheter. Signed, Dulcy Fanny. Dellia Nims, RPVI [facility] Radiology Electronically Signed   By: Corrie Mckusick D.O.   On: 02/25/2020 17:28   IR Removal Tun Cv Cath W/O FL  Result Date: 02/26/2020 INDICATION: Patient with acute kidney injury status post tunneled HD catheter placement 02/25/2020 in IR. Patient requesting to leave AMA, primary team requesting removal of tunneled HD catheter. EXAM: REMOVAL OF TUNNELED HEMODIALYSIS CATHETER MEDICATIONS: None COMPLICATIONS: None immediate. PROCEDURE: Informed written consent was obtained from the patient following an explanation of the procedure, risks, benefits and alternatives to treatment. A time out was performed prior to the initiation of the procedure. Maximal  barrier sterile technique was utilized including caps, mask, sterile gowns, sterile gloves, large sterile drape, hand hygiene, and Betadine. Utilizing gentle traction, the catheter was removed intact. Hemostasis was obtained with manual compression. A dressing was placed. The patient tolerated the procedure well without immediate post procedural complication. IMPRESSION: Successful removal of tunneled dialysis catheter. Read by Candiss Norse, PA-C Electronically Signed   By: Jacqulynn Cadet M.D.   On: 02/26/2020 17:34   IR US Guide Vasc Access Right  Result Date: 02/25/2020 INDICATION: 64 year old female referred for hemodialysis catheter placement EXAM: TUNNELED CENTRAL VENOUS HEMODIALYSIS CATHETER PLACEMENT WITH ULTRASOUND AND FLUOROSCOPIC GUIDANCE MEDICATIONS: 1 g vancomycin ANESTHESIA/SEDATION: Moderate (conscious) sedation was employed during this procedure. A total of Versed 1.0 mg and Fentanyl 50 mcg was administered intravenously. Moderate Sedation Time: 14 minutes. The patient's level of consciousness and vital signs were monitored continuously by radiology nursing throughout the procedure under my direct supervision. FLUOROSCOPY TIME:  Fluoroscopy Time: 0 minutes 6 seconds (1 mGy). COMPLICATIONS: None PROCEDURE: Informed written consent was obtained from the patient after a discussion of the risks, benefits, and alternatives to treatment. Questions regarding the procedure were encouraged and answered. The right neck and chest were prepped with chlorhexidine in a sterile fashion, and a sterile drape was applied covering the operative field. Maximum barrier sterile technique with sterile gowns and gloves were used for the procedure. A timeout was performed prior to the initiation of the procedure. After creating a small venotomy incision, a micropuncture kit was utilized to access the right internal jugular vein under direct, real-time ultrasound guidance after the overlying soft tissues were  anesthetized with 1% lidocaine with epinephrine. Ultrasound image documentation was performed. The microwire was marked to measure appropriate internal catheter length. External tunneled length was estimated. A total tip to cuff length of 19 cm was selected. Skin and subcutaneous tissues of chest wall below the clavicle were generously infiltrated with 1% lidocaine for local anesthesia. A small  stab incision was made with 11 blade scalpel. The selected hemodialysis catheter was tunneled in a retrograde fashion from the anterior chest wall to the venotomy incision. A guidewire was advanced to the level of the IVC and the micropuncture sheath was exchanged for a peel-away sheath. The catheter was then placed through the peel-away sheath with tips ultimately positioned within the superior aspect of the right atrium. Final catheter positioning was confirmed and documented with a spot radiographic image. The catheter aspirates and flushes normally. The catheter was flushed with appropriate volume heparin dwells. Tract was embolized with Gel-Foam slurry. The catheter exit site was secured with a 0-Prolene retention suture. The venotomy incision was closed Derma bond and sterile dressing. Dressings were applied at the chest wall. Patient tolerated the procedure well and remained hemodynamically stable throughout. No complications were encountered and no significant blood loss encountered. IMPRESSION: Status post right IJ tunneled hemodialysis catheter. Signed, Dulcy Fanny. Dellia Nims, RPVI [facility] Radiology Special[facility] Radiology Electronically Signed   By: Corrie Mckusick D.O.   On: 02/25/2020 17:28   DG Chest Port 1 View  Result Date: 02/14/2020 CLINICAL DATA:  Dyspnea. EXAM: PORTABLE CHEST 1 VIEW COMPARISON:  01/22/2020 FINDINGS: Interval enlarged cardiac silhouette, mild increase in prominence of the pulmonary vasculature and minimal increase in prominence of the interstitial markings.  Interval small amount of linear density at the left lung base. Minimal fluid in the minor fissure. Thoracic spine degenerative changes and thoracolumbar spine fixation hardware. Old, healed left clavicle fracture. IMPRESSION: 1. Interval cardiomegaly and minimal changes of congestive heart failure. 2. Interval small amount of linear atelectasis at the left lung base. Electronically Signed   By: Claudie Revering M.D.   On: 02/14/2020 16:30   VAS [name] LOWER EXTREMITY VENOUS (DVT)  Result Date: 02/01/2020  Lower Venous DVTStudy Indications: Swelling.  Comparison Study: No prior exam. Performing Technologist: Baldwin Crown ARDMS, RVT  Examination Guidelines: A complete evaluation includes B-mode imaging, spectral Doppler, color Doppler, and power Doppler as needed of all accessible portions of each vessel. Bilateral testing is considered an integral part of a complete examination. Limited examinations for reoccurring indications may be performed as noted. The reflux portion of the exam is performed with the patient in reverse Trendelenburg.  +--------+---------------+---------+-----------+----------+--------------------+ RIGHT   CompressibilityPhasicitySpontaneityPropertiesThrombus Aging       +--------+---------------+---------+-----------+----------+--------------------+ CFV     Full           Yes      Yes                                       +--------+---------------+---------+-----------+----------+--------------------+ SFJ     Full                                                              +--------+---------------+---------+-----------+----------+--------------------+ FV Prox Full                                                              +--------+---------------+---------+-----------+----------+--------------------+ FV Mid  Full                                                              +--------+---------------+---------+-----------+----------+--------------------+ FV       Full                                                              Distal                                                                    +--------+---------------+---------+-----------+----------+--------------------+ PFV     Full                                                              +--------+---------------+---------+-----------+----------+--------------------+ POP     Full           Yes      Yes                                       +--------+---------------+---------+-----------+----------+--------------------+ PTV     None                                         no blood flow                                                             visualized           +--------+---------------+---------+-----------+----------+--------------------+ PERO    None                                         no blood flow                                                             visualized           +--------+---------------+---------+-----------+----------+--------------------+   +--------+---------------+---------+-----------+----------+--------------------+ LEFT    CompressibilityPhasicitySpontaneityPropertiesThrombus Aging       +--------+---------------+---------+-----------+----------+--------------------+ CFV     Full  Yes      Yes                                       +--------+---------------+---------+-----------+----------+--------------------+ SFJ     Full                                                              +--------+---------------+---------+-----------+----------+--------------------+ FV Prox Full                                                              +--------+---------------+---------+-----------+----------+--------------------+ FV Mid  Full                                                              +--------+---------------+---------+-----------+----------+--------------------+ FV       Full                                                              Distal                                                                    +--------+---------------+---------+-----------+----------+--------------------+ PFV     Full                                                              +--------+---------------+---------+-----------+----------+--------------------+ POP     Full           Yes      Yes                                       +--------+---------------+---------+-----------+----------+--------------------+ PTV                                                  no blood flow  visualized           +--------+---------------+---------+-----------+----------+--------------------+ PERO                                                 no blood flow                                                             visualized           +--------+---------------+---------+-----------+----------+--------------------+     Summary: RIGHT: - Findings consistent with acute deep vein thrombosis involving the right peroneal veins, and right posterior tibial veins. - No cystic structure found in the popliteal fossa.  LEFT: - Findings consistent with acute deep vein thrombosis involving the left peroneal veins, and left posterior tibial veins. - No cystic structure found in the popliteal fossa.  *See table(s) above for measurements and observations. Electronically signed by Curt Jews MD on 02/01/2020 at 8:19:23 AM.    Final     Microbiology: Recent Results (from the past 240 hour(s))  Surgical pcr screen     Status: Abnormal   Collection Time: 02/25/20  1:48 AM   Specimen: Nasal Mucosa; Nasal Swab  Result Value Ref Range Status   MRSA, PCR NEGATIVE NEGATIVE Final   Staphylococcus aureus POSITIVE (A) NEGATIVE Final    Comment: CRITICAL RESULT CALLED TO, READ BACK BY AND VERIFIED WITH: RN, E. CASTRO  [phone] @0630  THANEY Performed at [facility] [address] [place], [place] [address]      Labs: Basic Metabolic Panel: Recent Labs  Lab 02/21/20 0355 02/21/20 0355 02/22/20 0408 02/23/20 0356 02/24/20 0425 02/25/20 0250 02/26/20 0759  NA 136   < > 137 136 134* 134* 137  K 4.2   < > 4.3 4.0 4.0 4.3 3.9  CL 97*   < > 98 97* 97* 99 101  CO2 28   < > 27 28 24  16* 24  GLUCOSE 154*   < > 159* 129* 120* 98 126*  BUN 77*   < > 77* 86* 93* 98* 64*  CREATININE 6.25*   < > 5.89* 6.26* 7.03* 7.47* 5.56*  CALCIUM 8.6*   < > 8.6* 8.7* 8.3* 8.5* 8.4*  MG 2.3  --  2.2 2.3 2.3 2.4  --   PHOS 5.3*   < > 5.4* 5.7* 6.0* 6.9* 4.9*   < > = values in this interval not displayed.   Liver Function Tests: Recent Labs  Lab 02/21/20 0355 02/21/20 0355 02/22/20 0408 02/23/20 0356 02/24/20 0425 02/25/20 0250 02/26/20 0759  AST 16  --  17 20 22 20   --   ALT 9  --  9 13 12 11   --   ALKPHOS 103  --  90 86 82 70  --   BILITOT 0.4  --  0.5 0.5 0.5 0.5  --   PROT 7.6  --  7.1 7.1 6.4* 6.1*  --   ALBUMIN 2.1*   < > 2.0* 2.0* 1.9* 1.8* 1.9*   < > = values in this interval not displayed.   No results for input(s): LIPASE, AMYLASE in the last 168 hours. No results for input(s):  AMMONIA in the last 168 hours. CBC: Recent Labs  Lab 02/22/20 0408 02/22/20 0408 02/23/20 0356 02/23/20 1510 02/24/20 0425 02/24/20 0746 02/24/20 2019 02/25/20 0250 02/26/20 0759  WBC 9.4  --  10.6*  --  9.6  --   --  9.2 9.2  HGB 8.8*   < > 8.1*   < > 7.3* 7.5* 7.5* 6.7* 7.4*  HCT 28.4*   < > 26.3*   < > 24.0* 24.4* 24.3* 21.7* 23.4*  MCV 87.4  --  88.9  --  89.9  --   --  90.8 88.3  PLT 120*  --  111*  --  106*  --   --  94* 97*   < > = values in this interval not displayed.   Cardiac Enzymes: No results for input(s): CKTOTAL, CKMB, CKMBINDEX, TROPONINI in the last 168 hours. BNP: BNP (last 3 results) Recent Labs    02/14/20 1223  BNP 233.0*    ProBNP (last 3 results) No results for  input(s): PROBNP in the last 8760 hours.  CBG: Recent Labs  Lab 02/25/20 1125 02/25/20 1755 02/25/20 2029 02/26/20 0652 02/26/20 1122  GLUCAP 78 84 152* 113* 92       Signed:  Iyahna Obriant  [facility] 02/27/2020, 3:19 PM

## 2020-02-26 NOTE — Progress Notes (Signed)
PHARMACY NOTE:  ANTIMICROBIAL RENAL DOSAGE ADJUSTMENT  Current antimicrobial regimen includes a mismatch between antimicrobial dosage and estimated renal function.  As per policy approved by the Pharmacy & Therapeutics and Medical Executive Committees, the antimicrobial dosage will be adjusted accordingly.  Current antimicrobial dosage:  Meropenem 1gm Iv q24h  Indication: vertebral pseudomonas  Renal Function:  Estimated Creatinine Clearance (by C-G formula based on SCr of 5.56 mg/dL (H)) Female: 10.1 mL/min (A) Female: 12.4 mL/min (A) []      On intermittent HD, scheduled: []      On CRRT    Antimicrobial dosage has been changed to:  Meropenem 562m IV q24h  Additional comments:   Ramah Langhans A. PLevada Dy PharmD, BCPS, FNKF Clinical Pharmacist [place] Please utilize Amion for appropriate phone number to reach the unit pharmacist (MParker   02/26/2020 1:14 PM

## 2020-02-26 NOTE — Progress Notes (Signed)
[facility] Progress Note   PCP: Ann Held, DO Admit Date: 02/18/2020 Requesting Physician: Cristal Ford, DO Reason for Consult:  AKI Assessment/ Plan:   Assessment & Plan  1. Acute Kidney Injury, oliguric Patient went for right tunneled IJ for dialysis yesterday.  She tolerated the procedure well.  She had her first dialysis session yesterday evening for 2 hours.  Had improved UOP in last 24 hours with 1.6 L. Her labs are much improved this morning with creatinine of 5.56, BUN 64.  Patient is at HD this morning for 2-1/2-hour treatment.  We will continue to monitor. Her vitals were stable overnight. Her ANCA returned negative.  UPC obtained yesterday was 1.29.  Urinalysis obtained yesterday prior to hemodialysis returned with moderate hemoglobin, 100 proteinuria, large leukocytes, greater than 50 white blood cells and bacteria.  -AM RFP -continue foley & supportive care  - reassess dialysis needs daily, tentative treatment on 5/1  - assess biopsy need  -continue PPI with positive FOBT  2. Subnephrotic range proteinuria Repeat UPC yesterday 1.29 3. Anemia, normocytic  S/p 1 unit PRBCs yesterday for hgb of 6.7.  - awaiting today's CBC   4. CKD stage III  History of CKD stage III likely secondary to uncontrolled diabetes.  Creatinine on 01/20/2020 was 1.35. 5. Urinary retention -continue Foley -Continue bethanechol 6. Pseudomonal discitis  L1 compression fracture  status post cefepime transition to meropenem on 02/14/2020.  Continue meropenem through 03/05/2020.   -Pain management per primary team. -Renally dose medications 7. Poorly controlled Diabetes, with severe microvascular disease -per primary 8. Constipation, per primary.  Patient notes that she has had some loose stools.  -No Fleet enemas 9. 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  No complaints   Objective:   BP (!) 103/45   Pulse 77   Temp 97.8 F (36.6 C)  (Oral)   Resp 14   Ht 5' 1"  (1.549 m) Comment: from 01/20/20 records  Wt 85.2 kg   SpO2 92%   BMI 35.49 kg/m  Intake/Output      04/28 0701 - 04/29 0700 04/29 0701 - 04/30 0700   P.O. 0    I.V. (mL/kg) 257.2 (3.1)    Other 0    IV Piggyback 100    Total Intake(mL/kg) 357.2 (4.3)    Urine (mL/kg/hr) 1200 (0.6)    Emesis/NG output 0    Other -602    Stool 0    Blood 0    Total Output 598    Net -240.8         Urine Occurrence 0 x    Stool Occurrence 5 x    Emesis Occurrence 0 x     Filed Weights   02/21/20 2123 02/25/20 2154 02/26/20 0818  Weight: 83.2 kg 83.3 kg 85.2 kg    Physical Exam: General: Ill-appearing female, lying in bed in hemodialysis asleep.  Easily arousable. No tremoring today.  Chest: Regular rate and rhythm, no murmurs, rubs or gallops Lungs: Clear to auscultation bilaterally. Abdomen: Soft, nontender nondistended.  Positive bowel sounds Extremities: No lower extremity edema.  Warm and well perfused.  Moving spontaneously.  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   Labs: BMET Recent Labs  Lab 02/20/20 0302 02/21/20 0355 02/22/20 0408 02/23/20 0356 02/24/20 0425 02/25/20 0250 02/26/20 0759  NA 138 136 137 136 134* 134* 137  K 4.5 4.2 4.3 4.0 4.0 4.3 3.9  CL 101 97* 98 97* 97*  99 101  CO2 25 28 27 28 24  16* 24  GLUCOSE 103* 154* 159* 129* 120* 98 126*  BUN 74* 77* 77* 86* 93* 98* 64*  CREATININE 6.56* 6.25* 5.89* 6.26* 7.03* 7.47* 5.56*  CALCIUM 8.5* 8.6* 8.6* 8.7* 8.3* 8.5* 8.4*  PHOS 4.9* 5.3* 5.4* 5.7* 6.0* 6.9* 4.9*   CBC Recent Labs  Lab 02/19/20 0933 02/20/20 0302 02/22/20 0408 02/22/20 0408 02/23/20 0356 02/23/20 1510 02/24/20 0425 02/24/20 0746 02/24/20 2019 02/25/20 0250  WBC 8.7   < > 9.4  --  10.6*  --  9.6  --   --  9.2  NEUTROABS 5.5  --   --   --   --   --   --   --   --   --   HGB 9.1*   < > 8.8*   < > 8.1*   < > 7.3* 7.5* 7.5* 6.7*  HCT 29.3*   < > 28.4*   < > 26.3*   < > 24.0* 24.4* 24.3*  21.7*  MCV 89.3   < > 87.4  --  88.9  --  89.9  --   --  90.8  PLT 139*   < > 120*  --  111*  --  106*  --   --  94*   < > = values in this interval not displayed.   Imaging: IR Fluoro Guide CV Line Right Result Date: 02/25/2020 EXAM: TUNNELED CENTRAL VENOUS HEMODIALYSIS CATHETER PLACEMENT WITH ULTRASOUND AND FLUOROSCOPIC GUIDANCE MEDICATIONS: IMPRESSION: Status post right IJ tunneled hemodialysis catheter.   Medications:    . heparin sodium (porcine)      . sodium chloride   Intravenous Once  . atorvastatin  10 mg Oral Daily  . bethanechol  10 mg Oral TID  . Chlorhexidine Gluconate Cloth  6 each Topical Daily  . Chlorhexidine Gluconate Cloth  6 each Topical Q0600  . heparin sodium (porcine)      . insulin aspart  0-9 Units Subcutaneous TID AC & HS  . lidocaine  1 patch Transdermal Q24H  . multivitamin with minerals  1 tablet Oral Daily  . pantoprazole  40 mg Oral Daily  . polyethylene glycol  17 g Oral BID  . pregabalin  75 mg Oral QHS  . psyllium  1 packet Oral Daily  . senna-docusate  2 tablet Oral QHS  . sodium chloride flush  10-40 mL Intracatheter Q12H  . traZODone  100 mg Oral QHS     Zettie Cooley, MD [place] Resident, PGY2 02/26/2020, 9:29 AM

## 2020-02-27 ENCOUNTER — Telehealth: Payer: Self-pay | Admitting: *Deleted

## 2020-02-27 LAB — TYPE AND SCREEN
ABO/RH(D): A NEG
Antibody Screen: POSITIVE
DAT, IgG: POSITIVE
Unit division: 0
Unit division: 0

## 2020-02-27 LAB — BPAM RBC
Blood Product Expiration Date: 202105082359
Blood Product Expiration Date: 202105082359
ISSUE DATE / TIME: 202104281207
Unit Type and Rh: 600
Unit Type and Rh: 600

## 2020-02-27 NOTE — Telephone Encounter (Signed)
1st attempt. Unable to reach patient.   
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
## 2020-03-01 ENCOUNTER — Ambulatory Visit (INDEPENDENT_AMBULATORY_CARE_PROVIDER_SITE_OTHER): Payer: PRIVATE HEALTH INSURANCE | Admitting: Family Medicine

## 2020-03-01 ENCOUNTER — Other Ambulatory Visit: Payer: Self-pay

## 2020-03-01 ENCOUNTER — Encounter: Payer: Self-pay | Admitting: Family Medicine

## 2020-03-01 VITALS — BP 179/96 | HR 78 | Temp 97.0°F | Resp 18

## 2020-03-01 DIAGNOSIS — M869 Osteomyelitis, unspecified: Secondary | ICD-10-CM

## 2020-03-01 DIAGNOSIS — E1165 Type 2 diabetes mellitus with hyperglycemia: Secondary | ICD-10-CM

## 2020-03-01 DIAGNOSIS — Z794 Long term (current) use of insulin: Secondary | ICD-10-CM

## 2020-03-01 DIAGNOSIS — Z9189 Other specified personal risk factors, not elsewhere classified: Secondary | ICD-10-CM

## 2020-03-01 DIAGNOSIS — L97421 Non-pressure chronic ulcer of left heel and midfoot limited to breakdown of skin: Secondary | ICD-10-CM

## 2020-03-01 DIAGNOSIS — Z940 Kidney transplant status: Secondary | ICD-10-CM | POA: Diagnosis not present

## 2020-03-01 DIAGNOSIS — Z992 Dependence on renal dialysis: Secondary | ICD-10-CM

## 2020-03-01 DIAGNOSIS — I825Y3 Chronic embolism and thrombosis of unspecified deep veins of proximal lower extremity, bilateral: Secondary | ICD-10-CM

## 2020-03-01 DIAGNOSIS — Z09 Encounter for follow-up examination after completed treatment for conditions other than malignant neoplasm: Secondary | ICD-10-CM

## 2020-03-01 DIAGNOSIS — Z5329 Procedure and treatment not carried out because of patient's decision for other reasons: Secondary | ICD-10-CM

## 2020-03-01 DIAGNOSIS — R5381 Other malaise: Secondary | ICD-10-CM

## 2020-03-01 DIAGNOSIS — N186 End stage renal disease: Secondary | ICD-10-CM

## 2020-03-01 LAB — CBC
HCT: 26 % — ABNORMAL LOW (ref 36.0–46.0)
Hemoglobin: 8.7 g/dL — ABNORMAL LOW (ref 12.0–15.0)
MCHC: 33.4 g/dL (ref 30.0–36.0)
MCV: 86.5 fl (ref 78.0–100.0)
Platelets: 154 10*3/uL (ref 150.0–400.0)
RBC: 3 Mil/uL — ABNORMAL LOW (ref 3.87–5.11)
RDW: 18.6 % — ABNORMAL HIGH (ref 11.5–15.5)
WBC: 7.7 10*3/uL (ref 4.0–10.5)

## 2020-03-01 LAB — COMPREHENSIVE METABOLIC PANEL
ALT: 9 U/L (ref 0–35)
AST: 15 U/L (ref 0–37)
Albumin: 3.2 g/dL — ABNORMAL LOW (ref 3.5–5.2)
Alkaline Phosphatase: 80 U/L (ref 39–117)
BUN: 48 mg/dL — ABNORMAL HIGH (ref 6–23)
CO2: 30 mEq/L (ref 19–32)
Calcium: 8.5 mg/dL (ref 8.4–10.5)
Chloride: 104 mEq/L (ref 96–112)
Creatinine, Ser: 2.73 mg/dL — ABNORMAL HIGH (ref 0.40–1.20)
GFR: 17.51 mL/min — ABNORMAL LOW (ref >60.00)
Glucose, Bld: 109 mg/dL — ABNORMAL HIGH (ref 70–99)
Potassium: 4.4 mEq/L (ref 3.5–5.1)
Sodium: 141 mEq/L (ref 135–145)
Total Bilirubin: 0.6 mg/dL (ref 0.2–1.2)
Total Protein: 7.1 g/dL (ref 6.0–8.3)

## 2020-03-01 LAB — GLUCOSE, POCT (MANUAL RESULT ENTRY): POC Glucose: 107 mg/dl — AB (ref 70–99)

## 2020-03-01 MED ORDER — FREESTYLE LIBRE 14 DAY SENSOR MISC: 1.0000 | Freq: Every day | 6 refills | Status: DC

## 2020-03-01 MED ORDER — FREESTYLE LIBRE 14 DAY READER DEVI: 1.0000 | Freq: Every day | 6 refills | Status: DC

## 2020-03-01 MED ORDER — CIPROFLOXACIN HCL 500 MG PO TABS: 500.0000 mg | ORAL_TABLET | Freq: Every day | ORAL | 0 refills | Status: DC

## 2020-03-01 MED ORDER — RIVAROXABAN 15 MG PO TABS: 15.0000 mg | ORAL_TABLET | Freq: Every day | ORAL | 1 refills | Status: DC

## 2020-03-01 NOTE — Patient Instructions (Signed)
It was nice to see you today, I will be in touch with your labs as soon as possible Once we get more information, we can discuss a blood thinner for clot, and or using Lasix.  We can also hopefully determine if you need to continue dialysis  I will send a message to Dr. Megan Salon in case he wants [name] to start her on any oral antibiotics now  Your glucose seems to be running on the low side.  I would suggest checking your sugar twice daily.  If it starts running higher than 150-200, start on 5 units of Lantus once a day.  We can then gradually titrate this dosage up, please keep me posted  I also prescribed the freestyle libre continuous blood glucose monitoring system if you would like to try this

## 2020-03-02 ENCOUNTER — Telehealth: Payer: Self-pay | Admitting: Family Medicine

## 2020-03-02 ENCOUNTER — Encounter: Admit: 2020-03-02 | Discharge: 2020-03-02 | Payer: MEDICARE

## 2020-03-02 LAB — URINE CULTURE
MICRO NUMBER:: 10431799
Result:: NO GROWTH
SPECIMEN QUALITY:: ADEQUATE

## 2020-03-02 MED ORDER — DIAZEPAM 5 MG PO TABS
5.0000 mg | ORAL_TABLET | Freq: Two times a day (BID) | ORAL | 0 refills | Status: DC | PRN
Start: 2020-03-02 — End: 2020-06-24

## 2020-03-02 NOTE — Telephone Encounter (Signed)
Mr [name] [name] calling in regards to medication re-fill status. Advise patient's spouse Dr [name] [name] nor [name] [name] are in the office today.    Please Advise

## 2020-03-02 NOTE — Telephone Encounter (Signed)
Caller : Samuel Bouche  Call Back # [phone]   Subject : Medication Refill   diazepam (VALIUM) 5 MG tablet [[id number]   Patient's spouse states that patient is having muscle spasms. Patient is in need of a medication that was given to her while in hospital. Patient was seen by Dr Lorelei Pont yesterday for a hospital follow up.   Please advise

## 2020-03-03 ENCOUNTER — Encounter: Admit: 2020-03-03 | Discharge: 2020-03-03 | Payer: MEDICARE

## 2020-03-03 MED ORDER — ALBUTEROL SULFATE 90 MCG/ACTUATION IN HFAA
2 | RESPIRATORY_TRACT | 3 refills | Status: AC | PRN
Start: 2020-03-03 — End: ?

## 2020-03-03 NOTE — Telephone Encounter
Pharmacy change requested for albuterol inhaler Rx. Received fax request from [facility] mail order.

## 2020-03-04 ENCOUNTER — Other Ambulatory Visit: Payer: Self-pay

## 2020-03-04 ENCOUNTER — Ambulatory Visit (INDEPENDENT_AMBULATORY_CARE_PROVIDER_SITE_OTHER): Payer: PRIVATE HEALTH INSURANCE | Admitting: Internal Medicine

## 2020-03-04 ENCOUNTER — Encounter: Payer: Self-pay | Admitting: Internal Medicine

## 2020-03-04 DIAGNOSIS — M4625 Osteomyelitis of vertebra, thoracolumbar region: Secondary | ICD-10-CM

## 2020-03-04 DIAGNOSIS — Z981 Arthrodesis status: Secondary | ICD-10-CM

## 2020-03-04 DIAGNOSIS — N179 Acute kidney failure, unspecified: Secondary | ICD-10-CM | POA: Diagnosis not present

## 2020-03-04 DIAGNOSIS — M869 Osteomyelitis, unspecified: Secondary | ICD-10-CM

## 2020-03-04 DIAGNOSIS — M00021 Staphylococcal arthritis, right elbow: Secondary | ICD-10-CM

## 2020-03-04 DIAGNOSIS — L97421 Non-pressure chronic ulcer of left heel and midfoot limited to breakdown of skin: Secondary | ICD-10-CM

## 2020-03-04 MED ORDER — CIPROFLOXACIN HCL 500 MG PO TABS: 500.0000 mg | ORAL_TABLET | Freq: Every day | ORAL | 5 refills | Status: DC

## 2020-03-04 NOTE — Progress Notes (Signed)
[facility] for Infectious Disease  Patient Active Problem List   Diagnosis Date Noted  . S/P lumbar fusion 03/04/2020    Priority: High  . Osteomyelitis of vertebra of thoracolumbar region [facility]) 01/21/2020    Priority: High  . AKI (acute kidney injury) ([place]) 11/15/2019    Priority: High  . Septic arthritis of elbow, right ([place]) 04/17/2019    Priority: Medium  . Ulcer of left heel, limited to breakdown of skin ([place]) 02/01/2017    Priority: Medium  . Spinal stenosis of lumbar region 01/22/2020  . Back pain 11/17/2019  . Compression fracture of L1 lumbar vertebra ([place]) 11/17/2019  . Fracture of multiple ribs 11/17/2019  . Intractable nausea and vomiting 11/16/2019  . Nausea & vomiting 11/15/2019  . Pressure injury of skin 04/17/2019  . 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 Sleep apnea 11/16/2017  . Diabetic foot infection ([place]) 04/06/2017  . Type 2 diabetes mellitus with hyperglycemia, with long-term current use of insulin ([place]) 04/06/2017  . Neuropathy 05/05/2015  . Diabetic peripheral neuropathy ([place]) 01/12/2015  . CKD (chronic kidney disease), stage III ([place]) 05/27/2014  . History of MI (myocardial infarction) 10/06/2013  . Nausea and vomiting 10/06/2013  . Obesity (BMI 30-39.9) 09/20/2013  . 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 Normocytic anemia 02/03/2013  . CAD (coronary artery disease) 01/11/2012  . Hepatic cirrhosis ([place]) 07/06/2010  . 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  . Hyperlipidemia 06/03/2008  . Essential hypertension 12/23/2006    Patient's Medications  New Prescriptions   No medications on file  Previous Medications   ACETAMINOPHEN (TYLENOL) 325 MG TABLET    Take 2 tablets (650 mg total) by mouth every 6 (six) hours as needed for fever.   ATORVASTATIN (LIPITOR) 10 MG TABLET    Take 1 tablet by mouth once daily   BETHANECHOL (URECHOLINE) 10 MG TABLET    Take 1 tablet (10 mg total) by mouth 3 (three) times daily.   CONTINUOUS BLOOD GLUC RECEIVER (FREESTYLE LIBRE 14 DAY READER) DEVI    1 Device by Does not apply route daily.   CONTINUOUS BLOOD GLUC SENSOR (FREESTYLE LIBRE 14 DAY SENSOR) MISC    1 Device by Does not apply route daily.   DIAZEPAM (VALIUM) 5 MG TABLET    Take 1 tablet (5 mg total) by mouth every 12 (twelve) hours as needed for anxiety.   FUROSEMIDE (LASIX) 10 MG/ML INJECTION    Inject 8 mLs (80 mg total) into the vein 2 (two) times daily.   HEPARIN 25000-0.45 UT/250ML-% INFUSION    Inject 1,200 Units/hr into the vein continuous.   INSULIN ASPART (NOVOLOG) 100 UNIT/ML INJECTION    Inject 0-5 Units into the skin at bedtime.   INSULIN ASPART (NOVOLOG) 100 UNIT/ML INJECTION    Inject 0-9 Units into the skin 3 (three) times daily with meals.   LIDOCAINE (LIDODERM) 5 %    Place 1 patch onto the skin daily. Remove & Discard patch within 12 hours or as directed by MD   MEROPENEM (MERREM) IVPB    Inject 1 g into the vein every 12 (twelve) hours for 20 days. Indication:  Hardware Associated Psuedomonal Vertebral Osteomyelitis  Last Day of Therapy:  03/05/20 Labs - Once weekly:  CBC/D and BMP, Labs - Every other week:  ESR and CRP   MEROPENEM 1 G IN SODIUM CHLORIDE 0.9 % 100 ML    Inject 1 g into the vein daily.   MULTIPLE VITAMIN (MULTIVITAMIN  WITH MINERALS) TABS TABLET    Take 1 tablet by mouth daily.   OXYCODONE (OXY IR/ROXICODONE) 5 MG IMMEDIATE RELEASE TABLET    Take 1 tablet (5 mg total) by mouth every 3 (three) hours as needed for moderate pain ((score 4 to 6)).   PANTOPRAZOLE (PROTONIX) 40 MG TABLET    Take 1 tablet (40 mg total) by mouth daily at 6 (six) AM.   PREGABALIN (LYRICA) 75 MG CAPSULE    Take 1 capsule (75 mg total) by mouth at bedtime.   RIVAROXABAN (XARELTO) 15 MG TABS TABLET    Take 1 tablet (15 mg total) by mouth daily with supper.   TRAZODONE (DESYREL) 50 MG TABLET    Take 1 tablet (50 mg total) by mouth at bedtime.  Modified Medications   Modified Medication Previous Medication    CIPROFLOXACIN (CIPRO) 500 MG TABLET ciprofloxacin (CIPRO) 500 MG tablet      Take 1 tablet (500 mg total) by mouth daily.    Take 1 tablet (500 mg total) by mouth daily.  Discontinued Medications   No medications on file    Subjective: Taneia is in with her husband for her hospital follow-up visit.  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  She received long courses of IV antibiotics throughout the last 6 months of last year.  When hospitalized in January she was switched to oral doxycycline for chronic suppression.  She missed her follow-up visit with me in February.  Unbeknownst to me she could not tolerate doxycycline and stopped it shortly after discharge in January. She has also been treated for polymicrobial left heel infection.  Previous cultures have grown Enterococcus, Proteus, Klebsiella and Pseudomonas.  She began having severe low back pain last month the pain has become progressively severe and she has not been able to get out of bed.  As a result she has missed many doctor appointments.  She came to the ED 01/20/2020.  MRI revealed:  IMPRESSION: Destruction of the L1 vertebral body with partial destruction of the inferior endplates of T01 and L2 with abnormal enhancing paraspinal and epidural soft tissues at that level. No definable abscess. I suspect this represents atypical infection such as TB or actinomycosis. B-cell lymphoma can also give this appearance. There is moderate compression of the thecal sac at L1 as described above.  She underwent operative debridement and T11-L3 fusion by Dr. Duffy Rhody.  Operative cultures grew Pseudomonas.  She was started on IV cefepime.  Her hospital course was complicated by acute kidney injury requiring intermittent hemodialysis.  Her cefepime was changed to meropenem.  Her pain improved after her surgery.  She received a total of 29 days of IV antibiotic therapy before she decided to leave the  hospital [facility] on 02/26/2020.  She was not on any antibiotic therapy until her PCP, Dr. Rossie Muskrat, started her on ciprofloxacin 2 days ago.  Other than her acute kidney injury she has not had any problems tolerating her antibiotics.  Her back pain is slowly improving.  Review of Systems: Review of Systems  Constitutional: Positive for malaise/fatigue. Negative for chills, fever and weight loss.  Respiratory: Negative for cough, sputum production and shortness of breath.   Cardiovascular: Negative for chest pain.  Gastrointestinal: Negative for abdominal pain, diarrhea, nausea and vomiting.  Genitourinary: Negative for dysuria.  Musculoskeletal: Positive for back pain and joint pain.  Skin: Negative for rash.  Neurological: Negative for headaches.  Past Medical History:  Diagnosis Date  . Acute MI [facility]) 2007   presented to ED & had cardiac cath- but found to have normal coronaries. Since that point in time her PCP cares f or cardiac needs. Dr. [name] - [facility]  . Anemia   . Anginal pain ([place])   . Anxiety   . Asthma   . Bulging lumbar disc   . Cataract   . 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 Cirrhosis of liver without mention of alcohol   . Constipation   . Dehiscence of closure of skin    left partial calcaneal excision  . Depression   . Diabetes mellitus    insulin dependent, adult onset  . Episode of visual loss of left eye   . Exertional shortness of breath   . Fatty liver   . Fibromyalgia   . GERD (gastroesophageal reflux disease)   . Hepatic steatosis   . High cholesterol   . Hypertension   . MRSA (methicillin resistant Staphylococcus aureus)   . Neuropathy    lower legs  . Osteoarthritis    hands, hips  . Proximal humerus fracture 10/15/12   Left  . PTSD (post-traumatic stress disorder)   . Renal insufficiency 05/05/2015    Social History   Tobacco Use  . Smoking status:  Never Smoker  . Smokeless tobacco: Never Used  Substance Use Topics  . Alcohol use: No    Alcohol/week: 0.0 standard drinks  . Drug use: No    Family History  Problem Relation Age of Onset  . Heart disease Father   . Diabetes Father   . Colitis Father   . Crohn's disease Father   . Cancer Father        leukemia  . Leukemia Father   . Diabetes Mellitus II Brother   . Kidney disease Brother   . Heart disease Brother   . Diabetes Mother   . Hypertension Mother   . Mental illness Mother   . Irritable bowel syndrome Daughter   . Diabetes Mellitus II Brother   . Kidney disease Brother   . Liver disease Brother   . Kidney disease Brother   . Heart attack Brother   . Diabetes Mellitus II Brother   . Heart disease Brother   . Liver disease Brother   . Kidney disease Brother   . Kidney disease Brother   . Diabetes Mellitus II Brother   . Diabetes Mellitus I Brother     Allergies  Allergen Reactions  . Bee Pollen Anaphylaxis  . Fish-Derived Products Hives, Shortness Of Breath, Swelling and Rash    Hives get in throat causing trouble breathing  . Mushroom Extract Complex Anaphylaxis  . Penicillins Anaphylaxis    **Tolerated cefepime March 2021 Did it involve swelling of the face/tongue/throat, SOB, or low BP? Yes Did it involve sudden or severe rash/hives, skin peeling, or any reaction on the inside of your mouth or nose? No Did you need to seek medical attention at a hospital or doctor's office? Yes When did it last happen?A few months ago If all above answers are "NO", may proceed with cephalosporin use.  [name] Kitchen Rosemary Oil Anaphylaxis  . Shellfish Allergy Hives, Shortness Of Breath, Swelling and Rash  . Tomato Hives and Shortness Of Breath    Hives in throat causes her trouble breathing  . Acetaminophen Other (See Comments)    GI upset  .  Acyclovir And Related Other (See Comments)    Unknown rxn  . Aloe Vera Hives  . Broccoli [Brassica Oleracea] Hives  . Naproxen  Other (See Comments)    Unknown rxn    Objective: Vitals:   03/04/20 1608  BP: (!) 166/90  Pulse: 79  Temp: 98.1 F (36.7 C)  TempSrc: Oral  SpO2: 94%   There is no height or weight on file to calculate BMI.  Physical Exam Constitutional:      Comments: She is seated in a wheelchair.  She is obviously feeling much better than when I last saw her in the hospital.  Cardiovascular:     Rate and Rhythm: Normal rate and regular rhythm.     Heart sounds: No murmur.  Pulmonary:     Effort: Pulmonary effort is normal.     Breath sounds: Normal breath sounds.  Chest:    Abdominal:     Palpations: Abdomen is soft.     Tenderness: There is no abdominal tenderness.  Musculoskeletal:     Comments: She has Steri-Strips on her back incisions.  Incisions are healing nicely.  She no longer has a draining sinus on her right elbow.  She has a shallow ulcer and very dry skin on her left heel without drainage or malodor.  Skin:    Findings: No rash.  Psychiatric:        Mood and Affect: Mood normal.     Lab Results CMP     Component Value Date/Time   NA 141 03/01/2020 1345   K 4.4 03/01/2020 1345   CL 104 03/01/2020 1345   CO2 30 03/01/2020 1345   GLUCOSE 109 (H) 03/01/2020 1345   BUN 48 (H) 03/01/2020 1345   CREATININE 2.73 (H) 03/01/2020 1345   CREATININE 1.83 (H) 08/19/2019 1701   CALCIUM 8.5 03/01/2020 1345   PROT 7.1 03/01/2020 1345   ALBUMIN 3.2 (L) 03/01/2020 1345   AST 15 03/01/2020 1345   ALT 9 03/01/2020 1345   ALKPHOS 80 03/01/2020 1345   BILITOT 0.6 03/01/2020 1345   GFRNONAA 7 (L) 02/26/2020 0759   GFRAA 9 (L) 02/26/2020 0759   Sed Rate (mm/hr)  Date Value  02/13/2020 124 (H)  02/10/2020 >140 (H)  11/06/2019 63 (H)   CRP (mg/dL)  Date Value  02/13/2020 8.5 (H)  02/10/2020 8.9 (H)  11/06/2019 0.8     Problem List Items Addressed This Visit      High   S/P lumbar fusion   Osteomyelitis of vertebra of thoracolumbar region [facility])    She is much  improved but at this point I certainly recommend continuing ciprofloxacin for her Pseudomonas vertebral infection.  We will recheck her sed rate and C-reactive protein today and see her back in 6 weeks.      Relevant Orders   C-reactive protein   Sedimentation rate   AKI (acute kidney injury) ([place])    Her acute kidney injury, probably caused by cefepime, is resolving.        Medium   Ulcer of left heel, limited to breakdown of skin ([place])    It appears that her left heel infection has been cured.      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       Other Visit Diagnoses    Osteomyelitis of other site, unspecified type [facility])       Relevant Medications  ciprofloxacin (CIPRO) 500 MG tablet       Michel Bickers, MD [facility] [phone] pager   [phone] cell 03/04/2020, 4:38 PM

## 2020-03-04 NOTE — Assessment & Plan Note (Signed)
Her acute kidney injury, probably caused by cefepime, is resolving.

## 2020-03-04 NOTE — Assessment & Plan Note (Signed)
She is much improved but at this point I certainly recommend continuing ciprofloxacin for her Pseudomonas vertebral infection.  We will recheck her sed rate and C-reactive protein today and see her back in 6 weeks.

## 2020-03-04 NOTE — Assessment & Plan Note (Signed)
It appears that her left heel infection has been cured.

## 2020-03-04 NOTE — Assessment & Plan Note (Signed)
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

## 2020-03-05 LAB — ACID FAST CULTURE WITH REFLEXED SENSITIVITIES (MYCOBACTERIA)
Acid Fast Culture: NEGATIVE
Acid Fast Culture: NEGATIVE
Source of Sample: 2

## 2020-03-05 LAB — SEDIMENTATION RATE: Sed Rate: 91 mm/h — ABNORMAL HIGH (ref 0–30)

## 2020-03-05 LAB — C-REACTIVE PROTEIN: CRP: 40.8 mg/L — ABNORMAL HIGH (ref <8.0)

## 2020-03-08 ENCOUNTER — Other Ambulatory Visit: Payer: Self-pay

## 2020-03-08 ENCOUNTER — Ambulatory Visit (HOSPITAL_BASED_OUTPATIENT_CLINIC_OR_DEPARTMENT_OTHER)
Admission: RE | Admit: 2020-03-08 | Discharge: 2020-03-08 | Disposition: A | Discharge status: Home / Self Care | Payer: PRIVATE HEALTH INSURANCE | Source: Ambulatory Visit | Attending: Family Medicine | Admitting: Family Medicine

## 2020-03-08 DIAGNOSIS — I825Y3 Chronic embolism and thrombosis of unspecified deep veins of proximal lower extremity, bilateral: Secondary | ICD-10-CM | POA: Diagnosis present

## 2020-03-10 ENCOUNTER — Telehealth: Payer: Self-pay | Admitting: Family Medicine

## 2020-03-10 DIAGNOSIS — I825Y3 Chronic embolism and thrombosis of unspecified deep veins of proximal lower extremity, bilateral: Secondary | ICD-10-CM

## 2020-03-10 NOTE — Telephone Encounter (Signed)
Patient to discuss her recent ultrasound report and addendum to report, see below  ADDENDUM REPORT: 03/08/2020 18:32  ADDENDUM: As the submitted images for the questionable soleal vein DVT demonstrate an arterial waveform, the imaging findings may represent artifact as opposed to a true DVT. Consider a short interval repeat DVT study for further evaluation.  These results will be called to the ordering clinician or representative by the Radiologist Assistant, and communication documented in the PACS or [employer].   Electronically Signed   By: Constance Holster M.D.   On: 03/08/2020 18:32   Signed by Amado Coe, MD on 03/08/2020 18:35    Narrative & Impression    CLINICAL DATA:  Calf DVT.  EXAM: BILATERAL LOWER EXTREMITY VENOUS DOPPLER ULTRASOUND  TECHNIQUE: Gray-scale sonography with compression, as well as color and duplex ultrasound, were performed to evaluate the deep venous system(s) from the level of the common femoral vein through the popliteal and proximal calf veins.  COMPARISON:  November 26, 2018  FINDINGS: VENOUS  Normal compressibility of the common femoral, superficial femoral, and popliteal veins, as well as the visualized calf veins. Visualized portions of profunda femoral vein and great saphenous vein unremarkable. No filling defects to suggest DVT on grayscale or color Doppler imaging. Doppler waveforms show normal direction of venous flow, normal respiratory plasticity and response to augmentation.  The left posterior tibial vein was poorly evaluated. A DVT at this level cannot be excluded. There is a questionable nonocclusive DVT at the level of the left soleal vein.  OTHER  None.  Limitations: none  IMPRESSION: 1. There is a questionable nonocclusive DVT at the level of the left soleal vein. No other left-sided DVT identified. 2. No right-sided DVT. 3. Suboptimal evaluation of the left posterior tibial  vein. A DVT at this level cannot be excluded.     I explained that there is a questionable DVT on the left, but this may actually be artifact.  She is currently taking her Xarelto without any issues.  We will plan to repeat her ultrasound at a 10-day interval, if negative that time she may stop Xarelto.  In the interim she will continue Xarelto, and watch carefully for any signs of bleeding

## 2020-03-15 ENCOUNTER — Ambulatory Visit (INDEPENDENT_AMBULATORY_CARE_PROVIDER_SITE_OTHER): Payer: PRIVATE HEALTH INSURANCE | Admitting: Family Medicine

## 2020-03-15 ENCOUNTER — Encounter: Payer: Self-pay | Admitting: Family Medicine

## 2020-03-15 VITALS — BP 146/80 | HR 84 | Temp 98.5°F

## 2020-03-15 DIAGNOSIS — M549 Dorsalgia, unspecified: Secondary | ICD-10-CM

## 2020-03-15 DIAGNOSIS — N183 Chronic kidney disease, stage 3 unspecified: Secondary | ICD-10-CM | POA: Diagnosis not present

## 2020-03-15 DIAGNOSIS — Z981 Arthrodesis status: Secondary | ICD-10-CM

## 2020-03-15 DIAGNOSIS — E1165 Type 2 diabetes mellitus with hyperglycemia: Secondary | ICD-10-CM

## 2020-03-15 DIAGNOSIS — M8610 Other acute osteomyelitis, unspecified site: Secondary | ICD-10-CM | POA: Diagnosis not present

## 2020-03-15 DIAGNOSIS — G894 Chronic pain syndrome: Secondary | ICD-10-CM | POA: Diagnosis not present

## 2020-03-15 DIAGNOSIS — G8929 Other chronic pain: Secondary | ICD-10-CM

## 2020-03-15 DIAGNOSIS — I82503 Chronic embolism and thrombosis of unspecified deep veins of lower extremity, bilateral: Secondary | ICD-10-CM

## 2020-03-15 DIAGNOSIS — L03119 Cellulitis of unspecified part of limb: Secondary | ICD-10-CM

## 2020-03-15 DIAGNOSIS — Z794 Long term (current) use of insulin: Secondary | ICD-10-CM

## 2020-03-15 DIAGNOSIS — L02619 Cutaneous abscess of unspecified foot: Secondary | ICD-10-CM

## 2020-03-15 DIAGNOSIS — N179 Acute kidney failure, unspecified: Secondary | ICD-10-CM

## 2020-03-15 DIAGNOSIS — M4625 Osteomyelitis of vertebra, thoracolumbar region: Secondary | ICD-10-CM

## 2020-03-15 MED ORDER — HYDROCODONE-ACETAMINOPHEN 5-325 MG PO TABS: 1.0000 | ORAL_TABLET | ORAL | 0 refills | Status: AC | PRN

## 2020-03-15 MED ORDER — DULOXETINE HCL 60 MG PO CPEP: 60.0000 mg | ORAL_CAPSULE | Freq: Every day | ORAL | 3 refills | Status: DC

## 2020-03-15 MED ORDER — HYDROCODONE-ACETAMINOPHEN 5-325 MG PO TABS: 1.0000 | ORAL_TABLET | Freq: Four times a day (QID) | ORAL | 0 refills | Status: DC | PRN

## 2020-03-15 NOTE — Progress Notes (Signed)
Patient ID: [name], adult    DOB: [birth year]  Age: 64 y.o. MRN: [id number]    Subjective:  Subjective  HPI [name] [name] presents for f/u from hosp with her husband.  She is doing much better -----  Wounds are all healing  [facility]--- she has see ID and they were please with how everthing was healing    She was admitted with osteomyelitis spine.   She sees nephrology tomorrow  She was also anemic but it was slowly improving  She does need to f/u with GI as well [name] [name] is a 64 year old Caucasian femalewith PMH CKD3, DMII, PTSD,neuropathy, hypertension, hyperlipidemia, GERD, fatty liver, cirrhosis, asthma, anemia who was recently hospitalized from 01/20/2020 until 01/29/2020,and was then discharged to inpatient rehab. She was hospitalized for osteomyelitis of L1 and underwent anterolateral corpectomy with PSIF on 01/22/2020 by neurosurgery. Tissue cultures grew Pseudomonas and she was also followed by ID. She had initially been on cefepime which was to be completed on 03/05/2020 for a total of 6 weeks of therapy.  She started developing progressiveacute on chronic renal failureand was transitioned to meropenem on 4/17to finish completing her course. She was also followed by nephrology as well. She has had adequate urine output and has also remained on twice daily IV Lasix. She has not met criteria for initiating dialysis at this time. Suspicion is for AIN due to cefepime. She had also been on a heparin drip for bilateral lower extremity DVT which was diagnosed on 01/30/2020.  Also, she has had a downtrending hemoglobin and has been evaluated by GI(positive hemoccult). She underwent EGD on 02/18/2020 (she had been declining colonoscopy), her EGD report was unable to be reviewed at time of this note.  I have reviewed the discharge summry--- she [facility] with several unstable problems at the time--- -She started dialysis on April 28 -Was receiving IV  Meropenem for osteomyelitis and epidural abscess -Had a Foley catheter in place due to urinary retention -Significant anemia with hemoglobin from 7-9 status post transfusion.  Patient had EGD on 4/21 with mild gastritis.  Patient received another transfusion on 4/28 -Thrombocytopenia, thought related to cirrhosis possibly -Recently diagnosed bilateral lower extremity DVTs.  Patient was on Xarelto but this was stopped due to bleeding, she was on a heparin drip -Cirrhosis  Labs on 4/29 show creatinine 5.5, GFR of 7 Hemoglobin 7.4/HCT 23/platelets 97  Lab Results  Component Value Date   HGB 8.7 Repeated and verified X2. (L) 03/01/2020    She is not doing dialysis right now as it was not arranged by time of [facility] discarge She was using xarelto but this seemed to possibly cause her GI bleed so this was stopped  She is seeing ID Dr Megan Salon on Thursday- she is not currently on antibiotics  She reports good urine output and wonders if she should be back on lasix-she was using Lasix at some point during her hospital stay, but this was not prescribed on [facility] discharge  She has novolog and lantus at home- she is not using any insulin now,no metformin She was using 20 lantus am and 40 pm = total 60 She was using novolog 13 with each meal She has not been checking her glucose at home right now She is interested in getting a CBG monitor system  Review of Systems  Constitutional: Negative.   HENT: Negative for congestion, ear pain, hearing loss, nosebleeds, postnasal drip, rhinorrhea, sinus pressure, sneezing and tinnitus.   Eyes:  Negative for photophobia, discharge, itching and visual disturbance.  Respiratory: Negative.   Cardiovascular: Negative.   Gastrointestinal: Negative for abdominal distention, abdominal pain, anal bleeding, blood in stool and constipation.  Endocrine: Negative.   Genitourinary: Negative.   Musculoskeletal: Positive for back pain and gait problem.  Skin: Negative.     Allergic/Immunologic: Negative.   Neurological: Negative for dizziness, weakness, light-headedness, numbness and headaches.  Psychiatric/Behavioral: Negative for agitation, confusion, decreased concentration, dysphoric mood, sleep disturbance and suicidal ideas. The patient is not nervous/anxious.     History Past Medical History:  Diagnosis Date  . Acute MI [facility]) 2007   presented to ED & had cardiac cath- but found to have normal coronaries. Since that point in time her PCP cares f or cardiac needs. Dr. [name] - [facility]  . Anemia   . Anginal pain ([place])   . Anxiety   . Asthma   . Bulging lumbar disc   . Cataract   . 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 Cirrhosis of liver without mention of alcohol   . Constipation   . Dehiscence of closure of skin    left partial calcaneal excision  . Depression   . Diabetes mellitus    insulin dependent, adult onset  . Episode of visual loss of left eye   . Exertional shortness of breath   . Fatty liver   . Fibromyalgia   . GERD (gastroesophageal reflux disease)   . Hepatic steatosis   . High cholesterol   . Hypertension   . MRSA (methicillin resistant Staphylococcus aureus)   . Neuropathy    lower legs  . Osteoarthritis    hands, hips  . Proximal humerus fracture 10/15/12   Left  . PTSD (post-traumatic stress disorder)   . 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); Cardiac catheterization (2007); Skin graft split thickness leg / foot (Right, 03/19/2013); Cholecystectomy (1995); Abdominal hysterectomy (1979); Dilation and curettage of uterus (1977); I & D extremity (Right, 03/19/2013); I & D extremity (Left, 09/08/2016); I & D extremity (Left,  09/29/2016); Incision and drainage (Right, 04/17/2019); IR US Guide Vasc Access Right (04/21/2019); IR Fluoro Guide CV Line Right (04/21/2019); IR Fluoro Guide CV Line Right (08/28/2019); IR US Guide Vasc Access Right (08/28/2019); IR Fluoro Guide CV Line Right (11/04/2019); IR Removal Tun Cv Cath W/O FL (11/18/2019); Anterior lat lumbar fusion (N/A, 01/22/2020); Posterior lumbar fusion 4 level (N/A, 01/22/2020); Application of robotic assistance for spinal procedure (N/A, 01/22/2020); Esophagogastroduodenoscopy (egd) with propofol (N/A, 02/18/2020); biopsy (02/18/2020); IR US Guide Vasc Access Right (02/25/2020); IR Fluoro Guide CV Line Right (02/25/2020); and IR Removal Tun Cv Cath W/O FL (02/26/2020).   Her family history includes Cancer in her father; Colitis in her father; Crohn's disease in her father; Diabetes in her father and mother; Diabetes Mellitus I in her brother; Diabetes Mellitus II in her brother, brother, brother, and brother; Heart attack in her brother; Heart disease in her brother, brother, and father; Hypertension in her mother; Irritable bowel syndrome in her daughter; Kidney disease in her brother, brother, brother, brother, and brother; Leukemia in her father; Liver disease in her brother and brother; Mental illness in her mother.She reports that she has never smoked. She has never used smokeless tobacco. She  reports that she does not drink alcohol or use drugs.  Current Outpatient Medications on File Prior to Visit  Medication Sig Dispense Refill  . acetaminophen (TYLENOL) 325 MG tablet Take 2 tablets (650 mg total) by mouth every 6 (six) hours as needed for fever.    [name] Kitchen atorvastatin (LIPITOR) 10 MG tablet Take 1 tablet by mouth once daily 90 tablet 1  . bethanechol (URECHOLINE) 10 MG tablet Take 1 tablet (10 mg total) by mouth 3 (three) times daily.    . ciprofloxacin (CIPRO) 500 MG tablet Take 1 tablet (500 mg total) by mouth daily. 30 tablet 5  . Continuous Blood Gluc Receiver (FREESTYLE LIBRE  14 DAY READER) DEVI 1 Device by Does not apply route daily. 2 each 6  . Continuous Blood Gluc Sensor (FREESTYLE LIBRE 14 DAY SENSOR) MISC 1 Device by Does not apply route daily. 2 each 6  . diazepam (VALIUM) 5 MG tablet Take 1 tablet (5 mg total) by mouth every 12 (twelve) hours as needed for anxiety. 30 tablet 0  . furosemide (LASIX) 10 MG/ML injection Inject 8 mLs (80 mg total) into the vein 2 (two) times daily. 4 mL 0  . heparin 25000-0.45 UT/250ML-% infusion Inject 1,200 Units/hr into the vein continuous.    . insulin aspart (NOVOLOG) 100 UNIT/ML injection Inject 0-5 Units into the skin at bedtime. 10 mL 11  . insulin aspart (NOVOLOG) 100 UNIT/ML injection Inject 0-9 Units into the skin 3 (three) times daily with meals. 10 mL 11  . lidocaine (LIDODERM) 5 % Place 1 patch onto the skin daily. Remove & Discard patch within 12 hours or as directed by MD 30 patch 0  . meropenem 1 g in sodium chloride 0.9 % 100 mL Inject 1 g into the vein daily. (Patient not taking: Reported on 03/04/2020)    . Multiple Vitamin (MULTIVITAMIN WITH MINERALS) TABS tablet Take 1 tablet by mouth daily.    [name] Kitchen oxyCODONE (OXY IR/ROXICODONE) 5 MG immediate release tablet Take 1 tablet (5 mg total) by mouth every 3 (three) hours as needed for moderate pain ((score 4 to 6)). 30 tablet 0  . pantoprazole (PROTONIX) 40 MG tablet Take 1 tablet (40 mg total) by mouth daily at 6 (six) AM.    . pregabalin (LYRICA) 75 MG capsule Take 1 capsule (75 mg total) by mouth at bedtime.    . Rivaroxaban (XARELTO) 15 MG TABS tablet Take 1 tablet (15 mg total) by mouth daily with supper. 30 tablet 1  . traZODone (DESYREL) 50 MG tablet Take 1 tablet (50 mg total) by mouth at bedtime.    . [DISCONTINUED] metFORMIN (GLUCOPHAGE) 1000 MG tablet Take 1,000 mg by mouth 2 (two) times daily with a meal.      . [DISCONTINUED] omeprazole (PRILOSEC) 20 MG capsule Take 20 mg by mouth daily.       No current facility-administered medications on file prior to  visit.     Objective:  Objective  Physical Exam Vitals and nursing note reviewed.  Constitutional:      Appearance: She is well-developed.  HENT:     Head: Normocephalic and atraumatic.  Eyes:     Conjunctiva/sclera: Conjunctivae normal.  Neck:     Thyroid: No thyromegaly.     Vascular: No carotid bruit or JVD.  Cardiovascular:     Rate and Rhythm: Normal rate and regular rhythm.     Heart sounds: Normal heart sounds. No murmur.  Pulmonary:     Effort: Pulmonary effort is  normal. No respiratory distress.     Breath sounds: Normal breath sounds. No wheezing or rales.  Chest:     Chest wall: No tenderness.  Musculoskeletal:     Cervical back: Normal range of motion and neck supple.  Neurological:     Mental Status: She is alert and oriented to person, place, and time.    BP (!) 146/80 (BP Location: Left Arm, Patient Position: Sitting, Cuff Size: Normal)   Pulse 84   Temp 98.5 F (36.9 C) (Oral)   SpO2 94%  Wt Readings from Last 3 Encounters:  02/26/20 187 lb 13.3 oz (85.2 kg)  02/18/20 190 lb 9.6 oz (86.5 kg)  01/20/20 180 lb 0.8 oz (81.7 kg)     Lab Results  Component Value Date   WBC 7.7 03/01/2020   HGB 8.7 Repeated and verified X2. (L) 03/01/2020   HCT 26.0 (L) 03/01/2020   PLT 154.0 03/01/2020   GLUCOSE 109 (H) 03/01/2020   CHOL 88 02/13/2020   TRIG 112 02/13/2020   HDL 21 (L) 02/13/2020   LDLDIRECT 115.8 01/23/2012   LDLCALC 45 02/13/2020   ALT 9 03/01/2020   AST 15 03/01/2020   NA 141 03/01/2020   K 4.4 03/01/2020   CL 104 03/01/2020   CREATININE 2.73 (H) 03/01/2020   BUN 48 (H) 03/01/2020   CO2 30 03/01/2020   TSH 1.65 01/03/2016   INR 1.2 01/22/2020   HGBA1C 6.6 (H) 02/12/2020   MICROALBUR 26.3 (H) 11/26/2018    US Venous Img Lower Bilateral (DVT)  Addendum Date: 03/08/2020   ADDENDUM REPORT: 03/08/2020 18:32 ADDENDUM: As the submitted images for the questionable soleal vein DVT demonstrate an arterial waveform, the imaging findings may  represent artifact as opposed to a true DVT. Consider a short interval repeat DVT study for further evaluation. These results will be called to the ordering clinician or representative by the Radiologist Assistant, and communication documented in the PACS or [employer]. Electronically Signed   By: Constance Holster M.D.   On: 03/08/2020 18:32   Result Date: 03/08/2020 CLINICAL DATA:  Calf DVT. EXAM: BILATERAL LOWER EXTREMITY VENOUS DOPPLER ULTRASOUND TECHNIQUE: Gray-scale sonography with compression, as well as color and duplex ultrasound, were performed to evaluate the deep venous system(s) from the level of the common femoral vein through the popliteal and proximal calf veins. COMPARISON:  November 26, 2018 FINDINGS: VENOUS Normal compressibility of the common femoral, superficial femoral, and popliteal veins, as well as the visualized calf veins. Visualized portions of profunda femoral vein and great saphenous vein unremarkable. No filling defects to suggest DVT on grayscale or color Doppler imaging. Doppler waveforms show normal direction of venous flow, normal respiratory plasticity and response to augmentation. The left posterior tibial vein was poorly evaluated. A DVT at this level cannot be excluded. There is a questionable nonocclusive DVT at the level of the left soleal vein. OTHER None. Limitations: none IMPRESSION: 1. There is a questionable nonocclusive DVT at the level of the left soleal vein. No other left-sided DVT identified. 2. No right-sided DVT. 3. Suboptimal evaluation of the left posterior tibial vein. A DVT at this level cannot be excluded. Electronically Signed: By: Constance Holster M.D. On: 03/08/2020 18:24     Assessment & Plan:  Plan  I have changed [name] [name]'s Cymbalta to DULoxetine. I have also changed her HYDROcodone-acetaminophen. I am also having her start on HYDROcodone-acetaminophen. Additionally, I am having her maintain her atorvastatin, lidocaine,  acetaminophen, bethanechol, furosemide, heparin, insulin aspart,  insulin aspart, meropenem 1 g in sodium chloride 0.9 % 100 mL, multivitamin with minerals, oxyCODONE, pantoprazole, pregabalin, traZODone, FreeStyle Libre 14 Day Reader, [employer] 14 Day Sensor, Rivaroxaban, diazepam, and ciprofloxacin.  Meds ordered this encounter  Medications  . DULoxetine (CYMBALTA) 60 MG capsule    Sig: Take 1 capsule (60 mg total) by mouth daily.    Dispense:  90 capsule    Refill:  3  . HYDROcodone-acetaminophen (NORCO/VICODIN) 5-325 MG tablet    Sig: Take 1-2 tablets by mouth every 4 (four) hours as needed for up to 10 days.    Dispense:  30 tablet    Refill:  0  . HYDROcodone-acetaminophen (NORCO/VICODIN) 5-325 MG tablet    Sig: Take 1 tablet by mouth every 6 (six) hours as needed for moderate pain.    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 ([place])    Repeat US pending ----  She will go to radiology and schedule [name]  con't xaralto       CKD (chronic kidney disease), stage III ([place])   Relevant Orders   Comprehensive metabolic panel   Osteomyelitis of vertebra of thoracolumbar region [facility])    Healing well  F/u ID Off  abx now       Type 2 diabetes mellitus with hyperglycemia, with long-term current use of insulin ([place])    Lab Results  Component Value Date   NA 141 03/01/2020   K 4.4 03/01/2020   CO2 30 03/01/2020   GLUCOSE 109 (H) 03/01/2020   BUN 48 (H) 03/01/2020   CREATININE 2.73 (H) 03/01/2020   CALCIUM 8.5 03/01/2020   GFRNONAA 7  (L) 02/26/2020   GFRAA 9 (L) 02/26/2020   F/u endo       Other Visit Diagnoses    Other acute osteomyelitis, unspecified site ([place])    -  Primary   Relevant Medications   HYDROcodone-acetaminophen (NORCO/VICODIN) 5-325 MG tablet   Other Relevant Orders   CBC with Differential/Platelet   Ambulatory referral to [place] metabolic panel   S/P spinal fusion       Relevant Medications   DULoxetine (CYMBALTA) 60 MG capsule   HYDROcodone-acetaminophen (NORCO/VICODIN) 5-325 MG tablet   Other Relevant Orders   Ambulatory referral to Neurosurgery   Chronic pain syndrome       Relevant Medications   DULoxetine (CYMBALTA) 60 MG capsule   HYDROcodone-acetaminophen (NORCO/VICODIN) 5-325 MG tablet   HYDROcodone-acetaminophen (NORCO/VICODIN) 5-325 MG tablet   Cellulitis and abscess of foot       Relevant Medications   HYDROcodone-acetaminophen (NORCO/VICODIN) 5-325 MG tablet        Home health referral placed due to inc pain and difficulty with mobility   Follow-up: Return in about 3 months (around 06/15/2020), or if symptoms worsen or fail to improve, for f/u.  Ann Held, DO

## 2020-03-15 NOTE — Patient Instructions (Signed)
Chronic Kidney Disease, Adult Chronic kidney disease (CKD) occurs when the kidneys become damaged slowly over a long period of time. The kidneys are a pair of organs that do many important jobs in the body, including:  Removing waste and extra fluid from the blood to make urine.  Making hormones that maintain the amount of fluid in tissues and blood vessels.  Maintaining the right amount of fluids and chemicals in the body. A small amount of kidney damage may not cause problems, but a large amount of damage may make it hard or impossible for the kidneys to work the way they should. If steps are not taken to slow down kidney damage or to stop it from getting worse, the kidneys may stop working permanently (end-stage renal disease or ESRD). Most of the time, CKD does not go away, but it can often be controlled. People who have CKD are usually able to live normal lives. What are the causes? The most common causes of this condition are diabetes and high blood pressure (hypertension). Other causes include:  Heart and blood vessel (cardiovascular) disease.  Kidney diseases, such as: ? Glomerulonephritis. ? Interstitial nephritis. ? Polycystic kidney disease. ? Renal vascular disease.  Diseases that affect the immune system.  Genetic diseases.  Medicines that damage the kidneys, such as anti-inflammatory medicines.  Being around or being in contact with poisonous (toxic) substances.  A kidney or urinary infection that occurs again and again (recurs).  Vasculitis. This is swelling or inflammation of the blood vessels.  A problem with urine flow that may be caused by: ? Cancer. ? Having kidney stones more than one time. ? An enlarged prostate, in males. What increases the risk? You are more likely to develop this condition if you:  Are older than age 63.  Are female.  Are African-American, Hispanic, Asian, [place], or American [place].  Are a current or former smoker.   Are obese.  Have a family history of kidney disease or failure.  Often take medicines that are damaging to the kidneys. What are the signs or symptoms? Symptoms of this condition include:  Swelling (edema) of the face, legs, ankles, or feet.  Tiredness (lethargy) and having less energy.  Nausea or vomiting.  Confusion or trouble concentrating.  Problems with urination, such as: ? Painful or burning feeling during urination. ? Decreased urine production. ? Frequent urination, especially at night. ? Bloody urine.  Muscle twitches and cramps, especially in the legs.  Shortness of breath.  Weakness.  Loss of appetite.  Metallic taste in the mouth.  Trouble sleeping.  Dry, itchy skin.  A low blood count (anemia).  Pale lining of the eyelids and surface of the eye (conjunctiva). Symptoms develop slowly and may not be obvious until the kidney damage becomes severe. It is possible to have kidney disease for years without having any symptoms. How is this diagnosed? This condition may be diagnosed based on:  Blood tests.  Urine tests.  Imaging tests, such as an ultrasound or CT scan.  A test in which a sample of tissue is removed from the kidneys to be examined under a microscope (kidney biopsy). These test results will help your health care provider determine how serious the CKD is. How is this treated? There is no cure for most cases of this condition, but treatment usually relieves symptoms and prevents or slows the progression of the disease. Treatment may include:  Making diet changes, which may require you to avoid alcohol, salty foods (sodium),  and foods that are high in potassium, calcium, and protein.  Medicines: ? To lower blood pressure. ? To control blood glucose. ? To relieve anemia. ? To relieve swelling. ? To protect your bones. ? To improve the balance of electrolytes in your blood.  Removing toxic waste from the body through types of dialysis, if  the kidneys can no longer do their job (kidney failure).  Managing any other conditions that are causing your CKD or making it worse. Follow these instructions at home: Medicines  Take over-the-counter and prescription medicines only as told by your health care provider. The dose of some medicines that you take may need to be adjusted.  Do not take any new medicines unless approved by your health care provider. Many medicines can worsen your kidney damage.  Do not take any vitamin and mineral supplements unless approved by your health care provider. Many nutritional supplements can worsen your kidney damage. General instructions  Follow your prescribed diet as told by your health care provider.  Do not use any products that contain nicotine or tobacco, such as cigarettes and e-cigarettes. If you need help quitting, ask your health care provider.  Monitor and track your blood pressure at home. Report changes in your blood pressure as told by your health care provider.  If you are being treated for diabetes, monitor and track your blood sugar (blood glucose) levels as told by your health care provider.  Maintain a healthy weight. If you need help with this, ask your health care provider.  Start or continue an exercise plan. Exercise at least 30 minutes a day, 5 days a week.  Keep your immunizations up to date as told by your health care provider.  Keep all follow-up visits as told by your health care provider. This is important. Where to find more information  American Association of Kidney Patients: BombTimer.gl  National Kidney Foundation: www.kidney.[place]: https://mathis.com/  Life [facility]: www.lifeoptions.org and www.kidneyschool.org Contact a health care provider if:  Your symptoms get worse.  You develop new symptoms. Get help right away if:  You develop symptoms of ESRD, which include: ? Headaches. ? Numbness in the hands or  feet. ? Easy bruising. ? Frequent hiccups. ? Chest pain. ? Shortness of breath. ? Lack of menstruation, in women.  You have a fever.  You have decreased urine production.  You have pain or bleeding when you urinate. Summary  Chronic kidney disease (CKD) occurs when the kidneys become damaged slowly over a long period of time.  The most common causes of this condition are diabetes and high blood pressure (hypertension).  There is no cure for most cases of this condition, but treatment usually relieves symptoms and prevents or slows the progression of the disease. Treatment may include a combination of medicines and lifestyle changes. This information is not intended to replace advice given to you by your health care provider. Make sure you discuss any questions you have with your health care provider. Document Revised: 09/28/2017 Document Reviewed: 11/23/2016 Elsevier Patient Education  2020 [employer].

## 2020-03-16 DIAGNOSIS — I82503 Chronic embolism and thrombosis of unspecified deep veins of lower extremity, bilateral: Secondary | ICD-10-CM | POA: Insufficient documentation

## 2020-03-16 HISTORY — DX: Chronic embolism and thrombosis of unspecified deep veins of lower extremity, bilateral: I82.503

## 2020-03-16 NOTE — Assessment & Plan Note (Signed)
Lab Results  Component Value Date   NA 141 03/01/2020   K 4.4 03/01/2020   CO2 30 03/01/2020   GLUCOSE 109 (H) 03/01/2020   BUN 48 (H) 03/01/2020   CREATININE 2.73 (H) 03/01/2020   CALCIUM 8.5 03/01/2020   GFRNONAA 7 (L) 02/26/2020   GFRAA 9 (L) 02/26/2020   F/u endo

## 2020-03-16 NOTE — Assessment & Plan Note (Signed)
Repeat US pending ----  She will go to radiology and schedule [name]  con't xaralto

## 2020-03-16 NOTE — Assessment & Plan Note (Signed)
Healing well  F/u ID Off  abx now

## 2020-03-16 NOTE — Assessment & Plan Note (Signed)
Chronic-- she did have a fusion  Osteomyelitis healing well per ID

## 2020-03-16 NOTE — Assessment & Plan Note (Signed)
Need to repeat labs  She has nephrology f/u tomorrow

## 2020-03-18 ENCOUNTER — Other Ambulatory Visit: Payer: Self-pay

## 2020-03-18 ENCOUNTER — Ambulatory Visit (HOSPITAL_BASED_OUTPATIENT_CLINIC_OR_DEPARTMENT_OTHER)
Admission: RE | Admit: 2020-03-18 | Discharge: 2020-03-18 | Disposition: A | Discharge status: Home / Self Care | Payer: PRIVATE HEALTH INSURANCE | Source: Ambulatory Visit | Attending: Family Medicine | Admitting: Family Medicine

## 2020-03-18 DIAGNOSIS — I825Y3 Chronic embolism and thrombosis of unspecified deep veins of proximal lower extremity, bilateral: Secondary | ICD-10-CM | POA: Diagnosis present

## 2020-03-19 ENCOUNTER — Telehealth: Payer: Self-pay | Admitting: Family Medicine

## 2020-03-19 ENCOUNTER — Encounter (HOSPITAL_BASED_OUTPATIENT_CLINIC_OR_DEPARTMENT_OTHER): Payer: PRIVATE HEALTH INSURANCE | Attending: Internal Medicine | Admitting: Internal Medicine

## 2020-03-19 DIAGNOSIS — Z89421 Acquired absence of other right toe(s): Secondary | ICD-10-CM | POA: Diagnosis not present

## 2020-03-19 DIAGNOSIS — E11621 Type 2 diabetes mellitus with foot ulcer: Secondary | ICD-10-CM | POA: Diagnosis not present

## 2020-03-19 DIAGNOSIS — I10 Essential (primary) hypertension: Secondary | ICD-10-CM | POA: Diagnosis not present

## 2020-03-19 DIAGNOSIS — I252 Old myocardial infarction: Secondary | ICD-10-CM | POA: Diagnosis not present

## 2020-03-19 DIAGNOSIS — E11319 Type 2 diabetes mellitus with unspecified diabetic retinopathy without macular edema: Secondary | ICD-10-CM | POA: Insufficient documentation

## 2020-03-19 DIAGNOSIS — L97528 Non-pressure chronic ulcer of other part of left foot with other specified severity: Secondary | ICD-10-CM | POA: Diagnosis present

## 2020-03-19 DIAGNOSIS — E1142 Type 2 diabetes mellitus with diabetic polyneuropathy: Secondary | ICD-10-CM | POA: Diagnosis not present

## 2020-03-19 NOTE — Telephone Encounter (Signed)
Call patient and let her know that her repeat lower extremity ultrasound is negative for DVT.  Given recent GI bleed, at this time I would recommend stopping anticoagulation with Xarelto.  I will contact her PCP to make sure no disagreement with this plan  She was diagnosed with DVT on April 2 on ultrasound-was treated with anticoagulation through now-a total of about 7 weeks  [name] 01/30/2020 - Findings consistent with acute deep vein thrombosis involving the right  peroneal veins, and right posterior tibial veins.  - No cystic structure found in the popliteal fossa.    LEFT:  - Findings consistent with acute deep vein thrombosis involving the left  peroneal veins, and left posterior tibial veins.  - No cystic structure found in the popliteal fossa.   [name] 5/10: IMPRESSION: 1. There is a questionable nonocclusive DVT at the level of the left soleal vein. No other left-sided DVT identified. 2. No right-sided DVT. 3. Suboptimal evaluation of the left posterior tibial vein. A DVT at this level cannot be excluded.  [name] 5/20:  IMPRESSION: Negative. The questionable left soleal vein thrombus was not appreciated on today's exam.

## 2020-03-23 NOTE — Progress Notes (Signed)
VILLA, BURGIN ([id number]) Visit Report for 03/19/2020 Allergy List Details Patient Name: Date of Service: [name] [name] [name], [name] 03/19/2020 2:45 PM Medical Record Number: [id number] Patient Account Number: [id number] Date of Birth/Sex: Treating RN: 24-Aug-1956 (64 y.o. Orvan Falconer Primary Care Lyncoln Maskell: LO WNE CHA SE, YV O NNE Other Clinician: Referring Shron Ozer: Treating Kristan Votta/Extender: Linton Ham LO WNE CHA SE, YV O NNE Weeks in Treatment: 0 Allergies Active Allergies Fish Containing Products Reaction: hives, swelling mushroom Reaction: anaphylaxis Severity: Severe penicillin Reaction: throat swelling Severity: Severe tomato, raw Reaction: hives rosemary Reaction: anaphylaxis Severity: Severe Shellfish Containing Products Reaction: hives, swelling Severity: Severe aloe vera Reaction: rash bee venom protein (honey bee) Reaction: hives, swelling Severity: Moderate Allergy Notes Electronic Signature(s) Signed: 03/23/2020 5:17:39 PM By: Carlene Coria RN Entered By: Carlene Coria on 03/19/2020 15:30:48 -------------------------------------------------------------------------------- Arrival Information Details Patient Name: Date of Service: [name] [name], [name] [name] 03/19/2020 2:45 PM Medical Record Number: [id number] Patient Account Number: [id number] Date of Birth/Sex: Treating RN: 1956/04/02 (64 y.o. F) [name], [name] Primary Care Dilcia Rybarczyk: LO WNE CHA SE, YV O NNE Other Clinician: Referring Emmalina Espericueta: Treating Deklen Popelka/Extender: Linton Ham LO WNE CHA SE, YV O NNE Weeks in Treatment: 0 Visit Information Patient Arrived: Walker Arrival Time: 15:26 Accompanied By: husband Transfer Assistance: None Patient Identification Verified: Yes Secondary Verification Process Completed: Yes Patient Requires Transmission-Based Precautions: No Patient Has Alerts: No History Since Last Visit All ordered tests and consults were completed: No Added or deleted any medications:  No Any new allergies or adverse reactions: No Had a fall or experienced change in activities of daily living that may affect risk of falls: No Signs or symptoms of abuse/neglect since last visito No Hospitalized since last visit: No Implantable device outside of the clinic excluding cellular tissue based products placed in the center since last visit: No Electronic Signature(s) Signed: 03/23/2020 5:17:39 PM By: Carlene Coria RN Entered By: Carlene Coria on 03/19/2020 15:29:57 -------------------------------------------------------------------------------- Clinic Level of Care Assessment Details Patient Name: Date of Service: [name] [name] [name], [name] [place] R. 03/19/2020 2:45 PM Medical Record Number: [id number] Patient Account Number: [id number] Date of Birth/Sex: Treating RN: 19-Feb-1956 (63 y.o. F) [name] [name] Primary Care Shaul Trautman: LO WNE CHA SE, YV O NNE Other Clinician: Referring Malli Falotico: Treating Monike Bragdon/Extender: Linton Ham LO WNE CHA SE, YV O NNE Weeks in Treatment: 0 Clinic Level of Care Assessment Items TOOL 2 Quantity Score X- 1 0 Use when only an EandM is performed on the INITIAL visit ASSESSMENTS - Nursing Assessment / Reassessment X- 1 20 General Physical Exam (combine w/ comprehensive assessment (listed just below) when performed on new pt. evals) X- 1 25 Comprehensive Assessment (HX, ROS, Risk Assessments, Wounds Hx, etc.) ASSESSMENTS - Wound and Skin A ssessment / Reassessment []  - 0 Simple Wound Assessment / Reassessment - one wound []  - 0 Complex Wound Assessment / Reassessment - multiple wounds []  - 0 Dermatologic / Skin Assessment (not related to wound area) ASSESSMENTS - Ostomy and/or Continence Assessment and Care []  - 0 Incontinence Assessment and Management []  - 0 Ostomy Care Assessment and Management (repouching, etc.) PROCESS - Coordination of Care X - Simple Patient / Family Education for ongoing care 1 15 []  - 0 Complex (extensive) Patient / Family  Education for ongoing care X- 1 10 Staff obtains Programmer, systems, Records, T Results / Process Orders est []  - 0 Staff telephones HHA, Nursing Homes / Clarify orders / etc []  - 0 Routine Transfer to another Facility (non-emergent condition) []  -  0 Routine [name] Admission (non-emergent condition) X- 1 15 New Admissions / Biomedical engineer / Ordering NPWT Apligraf, etc. , []  - 0 Emergency [name] Admission (emergent condition) X- 1 10 Simple Discharge Coordination []  - 0 Complex (extensive) Discharge Coordination PROCESS - Special Needs []  - 0 Pediatric / Minor Patient Management []  - 0 Isolation Patient Management []  - 0 Hearing / Language / Visual special needs []  - 0 Assessment of Community assistance (transportation, D/C planning, etc.) []  - 0 Additional assistance / Altered mentation []  - 0 Support Surface(s) Assessment (bed, cushion, seat, etc.) INTERVENTIONS - Wound Cleansing / Measurement []  - 0 Wound Imaging (photographs - any number of wounds) []  - 0 Wound Tracing (instead of photographs) []  - 0 Simple Wound Measurement - one wound []  - 0 Complex Wound Measurement - multiple wounds []  - 0 Simple Wound Cleansing - one wound []  - 0 Complex Wound Cleansing - multiple wounds INTERVENTIONS - Wound Dressings []  - 0 Small Wound Dressing one or multiple wounds []  - 0 Medium Wound Dressing one or multiple wounds []  - 0 Large Wound Dressing one or multiple wounds []  - 0 Application of Medications - injection INTERVENTIONS - Miscellaneous []  - 0 External ear exam []  - 0 Specimen Collection (cultures, biopsies, blood, body fluids, etc.) []  - 0 Specimen(s) / Culture(s) sent or taken to Lab for analysis []  - 0 Patient Transfer (multiple staff / Civil Service fast streamer / Similar devices) []  - 0 Simple Staple / Suture removal (25 or less) []  - 0 Complex Staple / Suture removal (26 or more) []  - 0 Hypo / Hyperglycemic Management (close monitor of Blood Glucose) []  -  0 Ankle / Brachial Index (ABI) - do not check if billed separately Has the patient been seen at the [name] within the last three years: Yes Total Score: 95 Level Of Care: New/Established - Level 3 Electronic Signature(s) Signed: 03/19/2020 5:37:38 PM By: Kela Millin Entered By: Kela Millin on 03/19/2020 15:59:15 -------------------------------------------------------------------------------- Encounter Discharge Information Details Patient Name: Date of Service: [name] [name], [name] [name] 03/19/2020 2:45 PM Medical Record Number: [id number] Patient Account Number: [id number] Date of Birth/Sex: Treating RN: 08-09-1956 (64 y.o. [name] Primary Care Deanza Upperman: LO WNE CHA SE, YV O NNE Other Clinician: Referring Zamyiah Tino: Treating Javier Mamone/Extender: Linton Ham LO WNE CHA SE, YV O NNE Weeks in Treatment: 0 Encounter Discharge Information Items Discharge Condition: Stable Ambulatory Status: Walker Discharge Destination: Home Transportation: Private Auto Accompanied By: spouse Schedule Follow-up Appointment: Yes Clinical Summary of Care: Patient Declined Electronic Signature(s) Signed: 03/19/2020 5:34:55 PM By: Baruch Gouty RN, BSN Entered By: Baruch Gouty on 03/19/2020 16:16:47 -------------------------------------------------------------------------------- Lower Extremity Assessment Details Patient Name: Date of Service: [name] [name], [name] [name] 03/19/2020 2:45 PM Medical Record Number: [id number] Patient Account Number: [id number] Date of Birth/Sex: Treating RN: 03/04/1956 (64 y.o. Orvan Falconer Primary Care Irania Durell: LO WNE CHA SE, YV O NNE Other Clinician: Referring Rudell Ortman: Treating Sherwood Castilla/Extender: Linton Ham LO WNE CHA SE, YV O NNE Weeks in Treatment: 0 Edema Assessment Assessed: [Left: No] [Right: No] E[Left: dema] [Right: :] Calf Left: Right: Point of Measurement: 36 cm From Medial Instep 35 cm cm Ankle Left: Right: Point of Measurement:  9 cm From Medial Instep 22 cm cm Electronic Signature(s) Signed: 03/23/2020 5:17:39 PM By: Carlene Coria RN Entered By: Carlene Coria on 03/19/2020 15:36:43 -------------------------------------------------------------------------------- Pain Assessment Details Patient Name: Date of Service: [name] [name] [name], [name] [name] 03/19/2020 2:45 PM Medical Record Number: [id number] Patient Account Number:  [id number] Date of Birth/Sex: Treating RN: August 17, 1956 (64 y.o. Orvan Falconer Primary Care Verlyn Dannenberg: LO WNE CHA SE, YV O NNE Other Clinician: Referring Adreyan Carbajal: Treating Wilian Kwong/Extender: Linton Ham LO WNE CHA SE, YV O NNE Weeks in Treatment: 0 Active Problems Location of Pain Severity and Description of Pain Patient Has Paino No Site Locations Pain Management and Medication Current Pain Management: Electronic Signature(s) Signed: 03/23/2020 5:17:39 PM By: Carlene Coria RN Entered By: Carlene Coria on 03/19/2020 15:36:54 -------------------------------------------------------------------------------- Vitals Details Patient Name: Date of Service: [name] [name], [name] [name] 03/19/2020 2:45 PM Medical Record Number: [id number] Patient Account Number: [id number] Date of Birth/Sex: Treating RN: [birth year] (64 y.o. F) [name], Payette Primary Care Tinsleigh Slovacek: LO WNE CHA SE, YV O NNE Other Clinician: Referring Ahniyah Giancola: Treating Khambrel Amsden/Extender: Linton Ham LO WNE CHA SE, YV O NNE Weeks in Treatment: 0 Vital Signs Time Taken: 15:30 Temperature (F): 98.4 Height (in): 61 Pulse (bpm): 89 Source: Stated Respiratory Rate (breaths/min): 18 Weight (lbs): 175 Blood Pressure (mmHg): 131/58 Source: Stated Reference Range: 80 - 120 mg / dl Body Mass Index (BMI): 33.1 Electronic Signature(s) Signed: 03/23/2020 5:17:39 PM By: Carlene Coria RN Entered By: Carlene Coria on 03/19/2020 15:30:30

## 2020-03-23 NOTE — Telephone Encounter (Signed)
Refer to hematology for dvt

## 2020-03-23 NOTE — Progress Notes (Signed)
[name] [name] [name] [name] ([id number]) Visit Report for 03/19/2020 Abuse/Suicide Risk Screen Details Patient Name: Date of Service: [name] [name] [name] [name]. 03/19/2020 2:45 PM Medical Record Number: [id number] Patient Account Number: [id number] Date of Birth/Sex: Treating RN: [birth year] (64 y.o. [name] [name] Primary Care Aarti Mankowski: LO WNE CHA SE, YV O NNE Other Clinician: Referring Phoenix Riesen: Treating Dagen Beevers/Extender: Linton Ham LO WNE CHA SE, YV O NNE Weeks in Treatment: 0 Abuse/Suicide Risk Screen Items Answer ABUSE RISK SCREEN: Has anyone close to you tried to hurt or harm you recentlyo No Do you feel uncomfortable with anyone in your familyo No Has anyone forced you do things that you didnt want to doo No Electronic Signature(s) Signed: 03/23/2020 5:17:39 PM By: Carlene Coria RN Entered By: Carlene Coria on 03/19/2020 15:32:21 -------------------------------------------------------------------------------- Activities of Daily Living Details Patient Name: Date of Service: [name] [name] [name] [name]. 03/19/2020 2:45 PM Medical Record Number: [id number] Patient Account Number: [id number] Date of Birth/Sex: Treating RN: 01-Apr-1956 (64 y.o. [name] [name] Primary Care Xzavion Doswell: LO WNE CHA SE, YV O NNE Other Clinician: Referring Montgomery Favor: Treating Letha Mirabal/Extender: Linton Ham LO WNE CHA SE, YV O NNE Weeks in Treatment: 0 Activities of Daily Living Items Answer Activities of Daily Living (Please select one for each item) Drive Automobile Completely Able T Medications ake Completely Able Use T elephone Completely Able Care for Appearance Completely Able Use T oilet Completely Able Bath / Shower Completely Able Dress Self Completely Able Feed Self Completely Able Walk Completely Able Get In / Out Bed Completely Able Housework Completely Able Prepare Meals Completely [place] Completely Able Shop for Self Completely Able Electronic Signature(s) Signed: 03/23/2020 5:17:39 PM By:  Carlene Coria RN Entered By: Carlene Coria on 03/19/2020 15:32:50 -------------------------------------------------------------------------------- Education Screening Details Patient Name: Date of Service: [name] [name] [name] [name]. 03/19/2020 2:45 PM Medical Record Number: [id number] Patient Account Number: [id number] Date of Birth/Sex: Treating RN: 07/29/1956 (64 y.o. [name] [name] Primary Care Reta Norgren: LO WNE CHA SE, YV O NNE Other Clinician: Referring Annikah Lovins: Treating Becka Lagasse/Extender: Linton Ham LO WNE CHA SE, YV O NNE Weeks in Treatment: 0 Primary Learner Assessed: Patient Learning Preferences/Education Level/Primary Language Learning Preference: Explanation Highest Education Level: College or Above Preferred Language: English Cognitive Barrier Language Barrier: No Translator Needed: No Memory Deficit: No Emotional Barrier: No Cultural/Religious Beliefs Affecting Medical Care: No Physical Barrier Impaired Vision: No Impaired Hearing: No Decreased Hand dexterity: No Knowledge/Comprehension Knowledge Level: Medium Comprehension Level: High Ability to understand written instructions: High Ability to understand verbal instructions: High Motivation Anxiety Level: Anxious Cooperation: Cooperative Education Importance: Acknowledges Need Interest in Health Problems: Asks Questions Perception: Coherent Willingness to Engage in Self-Management High Activities: Readiness to Engage in Self-Management High Activities: Electronic Signature(s) Signed: 03/23/2020 5:17:39 PM By: Carlene Coria RN Entered By: Carlene Coria on 03/19/2020 15:33:29 -------------------------------------------------------------------------------- Fall Risk Assessment Details Patient Name: Date of Service: [name] [name] [name] [name]. 03/19/2020 2:45 PM Medical Record Number: [id number] Patient Account Number: [id number] Date of Birth/Sex: Treating RN: 05/01/1956 (64 y.o. F) [name] [name] Primary Care  Kiaria Quinnell: LO WNE CHA SE, YV O NNE Other Clinician: Referring Murl Zogg: Treating Jaquelyn Sakamoto/Extender: Linton Ham LO WNE CHA SE, YV O NNE Weeks in Treatment: 0 Fall Risk Assessment Items Have you had 2 or more falls in the last 12 monthso 0 Yes Have you had any fall that resulted in injury in the last 12 monthso 0 Yes FALLS RISK SCREEN History of falling - immediate or within 3 months  0 No Secondary diagnosis (Do you have 2 or more medical diagnoseso) 0 No Ambulatory aid None/bed [name]/wheelchair/nurse 0 No Crutches/cane/walker 0 No Furniture 0 No Intravenous therapy Access/Saline/Heparin Lock 0 No Gait/Transferring Normal/ bed [name]/ wheelchair 0 No Weak (short steps with or without shuffle, stooped but able to lift head while walking, may seek 0 No support from furniture) Impaired (short steps with shuffle, may have difficulty arising from chair, head down, impaired 0 No balance) Mental Status Oriented to own ability 0 No Electronic Signature(s) Signed: 03/23/2020 5:17:39 PM By: Carlene Coria RN Entered By: Carlene Coria on 03/19/2020 15:33:54 -------------------------------------------------------------------------------- Foot Assessment Details Patient Name: Date of Service: [name] [name] [name] [name]. 03/19/2020 2:45 PM Medical Record Number: [id number] Patient Account Number: [id number] Date of Birth/Sex: Treating RN: 07/15/1956 (64 y.o. [name] [name] Primary Care Raeann Offner: LO WNE CHA SE, YV O NNE Other Clinician: Referring Maclovio Henson: Treating Soraida Vickers/Extender: Linton Ham LO WNE CHA SE, YV O NNE Weeks in Treatment: 0 Foot Assessment Items Site Locations + = Sensation present, - = Sensation absent, C = Callus, U = Ulcer [name] = Redness, W = Warmth, M = Maceration, PU = Pre-ulcerative lesion F = Fissure, S = Swelling, D = Dryness Assessment Right: Left: Other Deformity: No No Prior Foot Ulcer: No No Prior Amputation: No No Charcot Joint: No No Ambulatory Status:  Ambulatory Without Help Gait: Unsteady Electronic Signature(s) Signed: 03/23/2020 5:17:39 PM By: Carlene Coria RN Entered By: Carlene Coria on 03/19/2020 15:35:43 -------------------------------------------------------------------------------- Nutrition Risk Screening Details Patient Name: Date of Service: [name] [name] [name] [name]. 03/19/2020 2:45 PM Medical Record Number: [id number] Patient Account Number: [id number] Date of Birth/Sex: Treating RN: [birth year] (64 y.o. F) [name] [name] [name] Primary Care Laelia Angelo: LO WNE CHA SE, YV O NNE Other Clinician: Referring Bladyn Tipps: Treating Shamecca Whitebread/Extender: Linton Ham LO WNE CHA SE, YV O NNE Weeks in Treatment: 0 Height (in): 61 Weight (lbs): 175 Body Mass Index (BMI): 33.1 Nutrition Risk Screening Items Score Screening NUTRITION RISK SCREEN: I have an illness or condition that made me change the kind and/or amount of food I eat 0 No I eat fewer than two meals per day 0 No I eat few fruits and vegetables, or milk products 0 No I have three or more drinks of beer, liquor or wine almost every day 0 No I have tooth or mouth problems that make it hard for me to eat 0 No I don't always have enough money to buy the food I need 0 No I eat alone most of the time 0 No I take three or more different prescribed or over-the-counter drugs a day 1 Yes Without wanting to, I have lost or gained 10 pounds in the last six months 2 Yes I am not always physically able to shop, cook and/or feed myself 0 No Nutrition Protocols Good Risk Protocol 0 No interventions needed Moderate Risk Protocol High Risk Proctocol Risk Level: Moderate Risk Score: 3 Electronic Signature(s) Signed: 03/23/2020 5:17:39 PM By: Carlene Coria RN Entered By: Carlene Coria on 03/19/2020 15:34:22

## 2020-03-23 NOTE — Progress Notes (Signed)
DAFNE, NIELD ([id number]) Visit Report for 03/19/2020 Chief Complaint Document Details Patient Name: Date of Service: [name] [name] [name], [name] 03/19/2020 2:45 PM Medical Record Number: [id number] Patient Account Number: [id number] Date of Birth/Sex: Treating RN: 10-15-1956 (64 y.o. F) [name], [place] Primary Care Provider: LO WNE CHA SE, YV O NNE Other Clinician: Referring Provider: Treating Provider/Extender: Linton Ham LO WNE CHA SE, YV O NNE Weeks in Treatment: 0 Information Obtained from: Patient Chief Complaint 11/12/2018; patient is here for review of wound on her left plantar heel and the tip of her left great toe. 03/19/2020; patient return to clinic for review of wound on the left plantar heel Electronic Signature(s) Signed: 03/19/2020 6:00:23 PM By: Linton Ham MD Entered By: Linton Ham on 03/19/2020 16:19:25 -------------------------------------------------------------------------------- HPI Details Patient Name: Date of Service: [name] [name], [name] [name] 03/19/2020 2:45 PM Medical Record Number: [id number] Patient Account Number: [id number] Date of Birth/Sex: Treating RN: 12/03/1955 (64 y.o. F) Kela Millin Primary Care Provider: LO WNE CHA SE, YV O NNE Other Clinician: Referring Provider: Treating Provider/Extender: Linton Ham LO WNE CHA SE, YV O NNE Weeks in Treatment: 0 History of Present Illness HPI Description: ADMISSION 11/12/2018 This is a 64 year old woman with type 2 diabetes and diabetic neuropathy. She has been dealing with a left heel plantar wound for roughly 2 years. She states this started when she pulled some skin off the area and it progressed into a wound. She also has an area on the tip of her left great toe for 1 year. She has been largely followed by Dr. Sharol Given and she had an excision of bone in the left heel in 2017. I do not see microbiology from this excision or pathology. Apparently this wound never really closed. She most recently has  been using Silvadene cream. Offloading this with a scooter. The last MRI I see was in June 2018 which did not show osteomyelitis at that time. Apparently this wound is never really progressed towards healing. During her last review by Dr. Sharol Given in October it was recommended that she undergo a left BKA and she refused. She went to see a second orthopedic consult at [facility] who am recommended conservative wound care to see if this will close or progress towards closure but also warned that possible surgery may be necessary. An x-ray that was done at [facility] showed an irregular calcaneal body and calcaneal tuberosity. This is probably postprocedural. Previous x-rays at [facility] had suggested heterotrophic calcifications but I do not see this. Apparently a second ointment was added to the Silvadene which the patient thinks is because some improvement. She has not been systemically unwell. No fever or chills. She thinks the second ointment that was given to her at [facility] has helped somewhat. 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 ABIs done in June 2018 showed a ABI of 1.12 on the right and 1.1 on the left she was biphasic bilaterally. ABIs in our clinic were noncompressible today. 11/20/18 on evaluation today patient is seen for her second visit here in the office although this is actually the first visit with me concerning an issue that she's having with her great toe and heel of the left  foot. Fortunately she does not appear to be have any discomfort at this time and she does have a scooter in order to offload her foot she also has a boot for offloading. With that being said this is something that  appears to have been going on for some time she was seeing Dr. due to his recommendation was that she was going to require a below knee amputation. With that being said she wanted to come to the wound center but according to the patient Dr. Sharol Given told her that "we could not help her". Nonetheless she saw another provider who suggested that it may be worth a shot for [name] to try and help her out if it all possible. Nonetheless she decided that it would be worth a trial since otherwise any the way she's gonna end up with an amputation. Obviously if we can heal the wound then that will not be the case. Again I explained to her that obviously there are no guarantees but we will give this a good try and attempts to get the wound to heal. 1/30; the patient continues to have areas on the left plantar heel and the left plantar great toe. The more worrisome area is the heel. She went for MRI on Saturday but this could not be done because she had silver alginate in the wound bed. I would like to get this rebooked. If she does not have osteomyelitis she will need a total contact cast. We have been using silver alginate in the wounds 2/7; patient has wounds on her left plantar heel and left plantar great toe. The area on the heel has some depth although it looks about the same today. Her MRI will finally be done tomorrow. If she has osteomyelitis in the heel and then we will need to consider her for IV antibiotics and hyperbaric oxygen. If the MRI is negative she will need a total contact cast although she is wearing a cam boot and using a scooter at present. Will be using silver alginate. She will take it off tomorrow in preparation for the MRI 2/14; the patient's MRI showed extensive surgical changes with a large portion of her calcaneus removed on the left secondary to her previous surgery by Dr. Sharol Given however there is no evidence of osteomyelitis. She would therefore be a candidate for a total contact cast and we  applied this for the first time today 2/21; silver collagen total contact cast. The area on the plantar left great toe is "healed" still a lot of callus on this area. Dimensions on the plantar heel not too much different some epithelialization is present however. This is an improvement 12/25/18 on evaluation today patient actually is seen for follow-up concerning issues that she has been having with her cast she states she feels like it's wet and squishy in the bottom of her cast. The reason she does come in to have this evaluated and see what is going on. Fortunately there's no signs of infection at this time was the cast was removed. 3/6; the patient's area on the tip of her right great toe is callused but there is no open area here. She still has the fairly extensive area in the left heel. We have been using silver collagen in the wound. 01/08/19 on evaluation today patient actually appears to be doing very well in regard to her heel ulcer in my pinion to see if you shown signs of improvement which is excellent news. There's no evidence of infection.  She continues to have quite a bit of drainage but fortunately again this doesn't seem to be hindering her healing. 3/20 -Patient's foot ulcer on the left appears to be doing well, the dimensions appear encouraging, we have been using total contact cast with Prisma and will continue doing that 3/27; patient continues to make nice progress on the left plantar heel. She is using silver collagen under a total contact cast. It is been a while since I have seen this wound and it really looks a lot better. Smaller with healthy granulation 4/3; she continues to make nice progress on the left plantar heel. Using silver collagen under a total contact cast 4/10; left plantar heel. Again skin over the surface of the wound with not much in the way of adherence. This results in undermining. Using silver collagen changed to silver alginate 4/17 left plantar heel.  Again not much improvement. There is no undermining today no debridement was required we used silver alginate last time because of excessive moisture 4/24 left plantar heel. Again not as much improvement as I would have liked. About 3 mm of depth. Thick callused skin around the circumference. Using silver alginate 5/1; left plantar heel this is improved this week. Less depth epithelialization is present. Using silver alginate alginate under a total contact cast 5/8; left plantar heel dimensions are about the same. The depth appears to be improved. I use silver collagen starting today under the cast 5/15 left plantar heel. Arrives today with a 2-day history of feeling like something had "slipped" while walking her dog in the cast. Unfortunately extensive area relatively to the small wound of undermining superiorly denuded epithelium. 5/22-Patient returns at 1 week after being taken off the TCC on account of some fluid collection that was debrided and cultured at last visit from the plantar ulcer on the left heel. The culture results are polymicrobial with Klebsiella, enterococcus, Proteus growth these organisms are sensitive to Cipro except with enterococcus which is sensitive to ampicillin but patient is highly allergic to penicillin according to her. 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 5/29; the patient arrived last week with a new wound on her left plantar great toe. With regards to the culture that I did 2 weeks ago of her deteriorating heel wound this grew Klebsiella and enterococcus. She was given Cipro however the enterococcus would not be covered well by a quinolone. She is allergic to penicillin. I will give her linezolid 600 twice daily x5 days 6/12; the patient continues to have a wound on her left and after [place] to the beach there is undermining laterally. She has the new wound from this  2 weeks or so ago on the plantar tip of her left great toe. She had a fall today scraping the dorsal surface of the left fifth 7/14; READMISSION since the patient was last here she was hospitalized from 04/15/2019 through 04/22/2019. She had presented to an outside ER after falling and hitting her elbow. She had had previous surgery on the elbow and fractures several years ago. An x-ray was negative and she was sent home. She is readmitted with sepsis and acute renal failure secondary to septic arthritis of the elbow. Cultures apparently showed pansensitive staph aureus however she has a severe beta-lactam allergy. She was treated with vancomycin. Apparently the vancomycin is completed and her PICC line is removed although she is seeing Dr. Megan Salon tomorrow  due to continued pain and swelling. In the hospital she had an IandD by Dr. [name] of orthopedics. The original surgery was on 04/17/2019 and it was felt that she had septic arthritis. She is still having a lot of pain and swelling in the right elbow and apparently is due to see Dr. Megan Salon tomorrow and what she thinks is that she will be restarted on antibiotics. With regards to her heel wounds/left foot wounds. Her arterial studies were checked and her ABIs were within normal limits. X-ray showed plantar foot ulcer negative for osteomyelitis postop resection of the posterior calcaneus. She has been using silver alginate to the heel 8/4-Patient returns after being seen on 7/14, we are using silver alginate to the calcaneal wound, she is continuing to receive care for her right elbow septic arthritis 8/14- Patient returns after 1 week, she is being seen by the surgical group for her right elbow, she is here for the left calcaneal wound for which we are using silver alginate this is about the same 8/21; this is a patient I readmitted to the clinic 5 weeks ago. She is continuing to have difficulties with a septic arthritis of the right elbow she had  after a fall and apparently has had surgical IandD's since the last time we saw her. She is also following with Dr. Megan Salon of infectious disease and is apparently on 3 oral antibiotics although at the time of this dictation I am not sure what they are. She comes in today with a necrotic surface on the left great toe with a blister laterally. This was still clearly an open wound. She had purulent drainage coming out of this. The original wound on the plantar aspect of her right heel in the setting of a Charcot foot is deep not open to bone but certainly not any better at all. We have been using silver alginate 8/28; dealing with septic arthritis of the right elbow. May need to go for further surgery here in the second week of September. She had a blister on the left great toe that was purulent [place] draining last week although a culture did not grow anything [already on antibiotics through infectious disease for her elbow]. The punched-out area on her heel is just like it was when she first came in. This almost closed with a total contact cast there are no options for that now. The patient states she cannot stay off her foot having to do housework X-ray of the foot showed a moderate bunion and severe degenerative changes at the first metatarsal phalangeal joint there was a moderate plantar calcaneal spur. We will need to check the location of this. 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 It is oral. We are using silver collagen to the punched-out area on her left heel and to the summer superficial area of the left great toe. She is offloading this is best she can although judging by the amount of callus it is not enough. She thinks she has enough strength in her right elbow now to use her scooter. There is not an option for a cast 9/18; still dealing  with septic arthritis of the elbow she is apparently going for an MRI and a possible procedure next week she is on clindamycin and I have reviewed this with Dr. Hale Bogus notes and infectious disease.  We are using silver collagen to the punched-out areas on her left heel and to the plantar aspect of her left great toe she is now using her scooter to get around and to help offload these areas 10/2; 2-week follow-up. Still on clindamycin I believe for the left elbow she is going for an MRI of the elbow over the weekend. She is then going to see orthopedics and infectious disease. The area on the left heel is certainly no better. This does not probe to bone however there is green drainage. She has an area on the tip of her toe. The area on the heel is in a wound we almost closed at one point with total contact casting. 10/9; one-week follow-up. Culture I did of the heel last week which was a swab culture admittedly showed moderate Enterococcus faecalis moderate Pseudomonas. The wound itself looks about the same. There is no palpable bone although the depth of it closely approximates bone. The heel is swollen but not erythematous. Her MRI is booked for next week. She also has an MRI of the right elbow. I've also lifted the last consult from Dr. Megan Salon who is following her for septic arthritis of the right elbow. I did not see him specifically comment on her left heel however the patient states that he is aware of this. I did not specifically address the organisms I cultured last week because of the possible effect of any additional cultures that will be done on the elbow. Additionally these were superficial not bone cultures 10/16; her MRI of the heel was put back to next week. She did have her elbow done. I have been in contact with Dr. Megan Salon about my concerns about osteomyelitis of the left heel. We have been using collagen. She also has a wound on the plantar tip of her first toe 10/23; her MRI  of the heel was negative for osteomyelitis but suggested a small abscess. She also had an MRI of her elbow that showed findings compatible with a septic joint. She went to see Dr. Megan Salon and she is going to have both oral and IV antibiotics which I am sure will cover any infection in the heel. I did not actually see his note today. We are using silver collagen offloading the heel with a heel offloading boot 11/6; patient continues with silver collagen to both wound areas on her plantar left heel and plantar left great toe. She remains on daptomycin by Dr. Megan Salon of infectious disease. She complains of diarrhea. Dr. Megan Salon is aware of this. 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 she has been put on oral vancomycin I think a lot of high clinical suspicion for pseudomembranous colitis Because of the right elbow there are no options to completely off her left foot beyond the heel offloading boot we have now. Fortunately the left heel ulcer itself has remained static some improvement in the plantar left great toe 11/20; patient arrives for review of her left heel ulcer. I also note she has a difficult time with septic arthritis of the right elbow. She currently is on IV daptomycin which seems to have helped the drainage in her elbow [MSSA]. Because of high concern for pseudomembranous colitis she was also put on vancomycin orally and Flagyl orally. She was on Levaquin but that was discontinued because of the concern about C. difficile.  She states her diarrhea is better. She arrives in clinic today with a large area of denuded skin on the lateral part of the heel. This had totally separated. We have been using silver alginate to the wound areas. She arrived in a scooter telling me she is offloading the foot is much as possible. 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 Dr. Megan Salon stopped the daptomycin and now has her on vancomycin 1000 mg IV daily. This is supposed to go onto 1/18. She is being referred to [facility] for what sounds like a elbow replacement type operation for her MSSA septic arthritis. She arrives in clinic today with a blister on the medial part of the wound on her heel. She still has a lot of callus around the heel although the surface of the wound on the left heel looks somewhat better. READMISSION 03/19/2020 Mrs. Dessert is a 64 year old woman we have followed for almost all of 2020 with a neuropathic wound on her left heel and the tip of her left great toe.. We she had previously had a partial calcanectomy by Dr. Sharol Given because of underlying osteomyelitis. She also had a history of MRSA even when we admitted her to the clinic. An MRI when she first came into our clinic did not show osteomyelitis. We put her in a total contact cast and gradually this contracted to the point it was almost closed however in that same timeframe she had a fall. 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 We had to stop putting her in a cast partially because of infection and partially because she could not support herself with the elbow. Things went progressively downhill. When we last saw her at the end of December she had a large open wound on the left heel. She had had recurrent infections in the right elbow. It was felt that either the heel lift: Ice her elbow or vice versa. We never had MRSA I do not believe cultured in the left heel however. 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 She underwent an operative debridement and a T11-L3 fusion. An operative culture grew Pseudomonas.  She was treated with cefepime and required readmission to the hospital with acute kidney failure requiring temporary dialysis. She was discharged to rehab I believe she signed herself out [place]. He has been following with her primary doctor and she comes in the clinic today with miraculously the left heel totally closed. She is followed up with Dr. Megan Salon of infectious disease he does not feel she has any evidence of infection in her elbow or her heel for that matter and the heel is actually fully epithelialized. She is on ciprofloxacin 500 twice daily I think mostly directed at Pseudomonas. The patient is convinced that it was the treatment of the Pseudomonas in her back that ultimately led to closure of her heel and that certainly possible or could have been because she was nonambulatory in the hospital for 3 months according to her. She is still using her heel offloading boot and she says she is "learning to walk againLoss adjuster, chartered) Signed: 03/19/2020 6:00:23 PM By: Linton Ham MD Entered By: Linton Ham on 03/19/2020 18:84:16 -------------------------------------------------------------------------------- Physical Exam Details Patient Name: Date of Service: [name] [name]  [name], [name] [name] 03/19/2020 2:45 PM Medical Record Number: [id number] Patient Account Number: [id number] Date of Birth/Sex: Treating RN: September 09, 1956 (64 y.o. F) [name], [facility] Primary Care Provider: LO WNE CHA SE, YV O NNE Other Clinician: Referring Provider: Treating Provider/Extender: Linton Ham LO WNE CHA SE, YV O NNE Weeks in Treatment: 0 Constitutional Sitting or standing Blood Pressure is within target range for patient.. Pulse regular and within target range for patient.[name] Kitchen Respirations regular, non-labored and within target range.. Temperature is normal and within the target range for the patient.[name] Kitchen Appears in no distress. Cardiovascular Pedal pulses are palpable. Notes The area on  her plantar left heel is completely closed. Fully epithelialized nontender no erythema no drainage. She has very significant neuropathy I do not know that she has a lot of sensation in this area but nothing is present that really looks particularly ominous. Electronic Signature(s) Signed: 03/19/2020 6:00:23 PM By: Linton Ham MD Signed: 03/19/2020 6:00:23 PM By: Linton Ham MD Entered By: Linton Ham on 03/19/2020 16:29:32 -------------------------------------------------------------------------------- Physician Orders Details Patient Name: Date of Service: [name] [name], [name] [name] 03/19/2020 2:45 PM Medical Record Number: [id number] Patient Account Number: [id number] Date of Birth/Sex: Treating RN: 25-Jan-1956 (64 y.o. F) [name], [facility] Primary Care Provider: LO WNE CHA SE, YV O NNE Other Clinician: Referring Provider: Treating Provider/Extender: Linton Ham LO WNE CHA SE, YV O NNE Weeks in Treatment: 0 Verbal / Phone Orders: No Diagnosis Coding Discharge From [facility] Services Discharge from [place] - call if wound [name]-opens Primary Wound Dressing Other: - can use a heel cup to cushion and continue to wear offloading heel shoe for another month Electronic Signature(s) Signed: 03/19/2020 5:37:38 PM By: Kela Millin Signed: 03/19/2020 6:00:23 PM By: Linton Ham MD Entered By: Kela Millin on 03/19/2020 16:06:05 -------------------------------------------------------------------------------- Problem List Details Patient Name: Date of Service: [name] [name], [name] [name] 03/19/2020 2:45 PM Medical Record Number: [id number] Patient Account Number: [id number] Date of Birth/Sex: Treating RN: 1956/05/21 (64 y.o. F) Kela Millin Primary Care Provider: LO WNE CHA SE, YV O NNE Other Clinician: Referring Provider: Treating Provider/Extender: Linton Ham LO WNE CHA SE, YV O NNE Weeks in Treatment: 0 Active Problems ICD-10 Encounter Code Description Active  Date MDM Diagnosis E11.621 Type 2 diabetes mellitus with foot ulcer 03/19/2020 No Yes L97.528 Non-pressure chronic ulcer of other part of left foot with other specified 03/19/2020 No Yes severity E11.42 Type 2 diabetes mellitus with diabetic polyneuropathy 03/19/2020 No Yes Inactive Problems Resolved Problems Electronic Signature(s) Signed: 03/19/2020 5:37:38 PM By: Kela Millin Signed: 03/19/2020 6:00:23 PM By: Linton Ham MD Entered By: Kela Millin on 03/19/2020 16:19:44 -------------------------------------------------------------------------------- Progress Note Details Patient Name: Date of Service: [name] [name], [name] [name] 03/19/2020 2:45 PM Medical Record Number: [id number] Patient Account Number: [id number] Date of Birth/Sex: Treating RN: 1956-05-27 (64 y.o. F) [name], Larene Beach Primary Care Provider: LO WNE CHA SE, YV O NNE Other Clinician: Referring Provider: Treating Provider/Extender: Linton Ham LO WNE CHA SE, YV O NNE Weeks in Treatment: 0 Subjective Chief Complaint Information obtained from Patient 11/12/2018; patient is here for review of wound on her left plantar heel and the tip of her left great toe. 03/19/2020; patient return to clinic for review of wound on the left plantar heel History of Present Illness (HPI) ADMISSION 11/12/2018 This is a 64 year old woman with type 2 diabetes and diabetic neuropathy. She has been dealing with a left heel plantar wound for roughly 2 years. She states this started when she pulled some  skin off the area and it progressed into a wound. She also has an area on the tip of her left great toe for 1 year. She has been largely followed by Dr. Sharol Given and she had an excision of bone in the left heel in 2017. I do not see microbiology from this excision or pathology. Apparently this wound never really closed. She most recently has been using Silvadene cream. Offloading this with a scooter. The last MRI I see was in June 2018 which did  not show osteomyelitis at that time. Apparently this wound is never really progressed towards healing. During her last review by Dr. Sharol Given in October it was recommended that she undergo a left BKA and she refused. She went to see a second orthopedic consult at [facility] who am recommended conservative wound care to see if this will close or progress towards closure but also warned that possible surgery may be necessary. An x-ray that was done at [facility] showed an irregular calcaneal body and calcaneal tuberosity. This is probably postprocedural. Previous x-rays at [facility] had suggested heterotrophic calcifications but I do not see this. Apparently a second ointment was added to the Silvadene which the patient thinks is because some improvement. She has not been systemically unwell. No fever or chills. She thinks the second ointment that was given to her at [facility] has helped somewhat. 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 ABIs done in June 2018 showed a ABI of 1.12 on the right and 1.1 on the left she was biphasic bilaterally. ABIs in our clinic were noncompressible today. 11/20/18 on evaluation today patient is seen for her second visit here in the office although this is actually the first visit with me concerning an issue that she's having with her great toe and heel of the left foot. Fortunately she does not appear to be have any discomfort at this time and she does have a scooter in order to offload her foot she also has a boot for offloading. With that being said this is something that appears to have been going on for some time she was seeing Dr. due to his recommendation was that she was going  to require a below knee amputation. With that being said she wanted to come to the wound center but according to the patient Dr. Sharol Given told her that "we could not help her". Nonetheless she saw another provider who suggested that it may be worth a shot for [name] to try and help her out if it all possible. Nonetheless she decided that it would be worth a trial since otherwise any the way she's gonna end up with an amputation. Obviously if we can heal the wound then that will not be the case. Again I explained to her that obviously there are no guarantees but we will give this a good try and attempts to get the wound to heal. 1/30; the patient continues to have areas on the left plantar heel and the left plantar great toe. The more worrisome area is the heel. She went for MRI on Saturday but this could not be done because she had silver alginate in the wound bed. I would like to get this rebooked.  If she does not have osteomyelitis she will need a total contact cast. We have been using silver alginate in the wounds 2/7; patient has wounds on her left plantar heel and left plantar great toe. The area on the heel has some depth although it looks about the same today. Her MRI will finally be done tomorrow. If she has osteomyelitis in the heel and then we will need to consider her for IV antibiotics and hyperbaric oxygen. If the MRI is negative she will need a total contact cast although she is wearing a cam boot and using a scooter at present. Will be using silver alginate. She will take it off tomorrow in preparation for the MRI 2/14; the patient's MRI showed extensive surgical changes with a large portion of her calcaneus removed on the left secondary to her previous surgery by Dr. Sharol Given however there is no evidence of osteomyelitis. She would therefore be a candidate for a total contact cast and we applied this for the first time today 2/21; silver collagen total contact cast. The area on the plantar left  great toe is "healed" still a lot of callus on this area. Dimensions on the plantar heel not too much different some epithelialization is present however. This is an improvement 12/25/18 on evaluation today patient actually is seen for follow-up concerning issues that she has been having with her cast she states she feels like it's wet and squishy in the bottom of her cast. The reason she does come in to have this evaluated and see what is going on. Fortunately there's no signs of infection at this time was the cast was removed. 3/6; the patient's area on the tip of her right great toe is callused but there is no open area here. She still has the fairly extensive area in the left heel. We have been using silver collagen in the wound. 01/08/19 on evaluation today patient actually appears to be doing very well in regard to her heel ulcer in my pinion to see if you shown signs of improvement which is excellent news. There's no evidence of infection. She continues to have quite a bit of drainage but fortunately again this doesn't seem to be hindering her healing. 3/20 -Patient's foot ulcer on the left appears to be doing well, the dimensions appear encouraging, we have been using total contact cast with Prisma and will continue doing that 3/27; patient continues to make nice progress on the left plantar heel. She is using silver collagen under a total contact cast. It is been a while since I have seen this wound and it really looks a lot better. Smaller with healthy granulation 4/3; she continues to make nice progress on the left plantar heel. Using silver collagen under a total contact cast 4/10; left plantar heel. Again skin over the surface of the wound with not much in the way of adherence. This results in undermining. Using silver collagen changed to silver alginate 4/17 left plantar heel. Again not much improvement. There is no undermining today no debridement was required we used silver alginate  last time because of excessive moisture 4/24 left plantar heel. Again not as much improvement as I would have liked. About 3 mm of depth. Thick callused skin around the circumference. Using silver alginate 5/1; left plantar heel this is improved this week. Less depth epithelialization is present. Using silver alginate alginate under a total contact cast 5/8; left plantar heel dimensions are about the same. The depth appears to be improved.  I use silver collagen starting today under the cast 5/15 left plantar heel. Arrives today with a 2-day history of feeling like something had "slipped" while walking her dog in the cast. Unfortunately extensive area relatively to the small wound of undermining superiorly denuded epithelium. 5/22-Patient returns at 1 week after being taken off the TCC on account of some fluid collection that was debrided and cultured at last visit from the plantar ulcer on the left heel. The culture results are polymicrobial with Klebsiella, enterococcus, Proteus growth these organisms are sensitive to Cipro except with enterococcus which is sensitive to ampicillin but patient is highly allergic to penicillin according to her. 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 5/29; the patient arrived last week with a new wound on her left plantar great toe. With regards to the culture that I did 2 weeks ago of her deteriorating heel wound this grew Klebsiella and enterococcus. She was given Cipro however the enterococcus would not be covered well by a quinolone. She is allergic to penicillin. I will give her linezolid 600 twice daily x5 days 6/12; the patient continues to have a wound on her left and after [place] to the beach there is undermining laterally. She has the new wound from this 2 weeks or so ago on the plantar tip of her left great toe. She had a fall today scraping the dorsal surface of  the left fifth 7/14; READMISSION since the patient was last here she was hospitalized from 04/15/2019 through 04/22/2019. She had presented to an outside ER after falling and hitting her elbow. She had had previous surgery on the elbow and fractures several years ago. An x-ray was negative and she was sent home. She is readmitted with sepsis and acute renal failure secondary to septic arthritis of the elbow. Cultures apparently showed pansensitive staph aureus however she has a severe beta-lactam allergy. She was treated with vancomycin. Apparently the vancomycin is completed and her PICC line is removed although she is seeing Dr. Megan Salon tomorrow due to continued pain and swelling. In the hospital she had an IandD by Dr. [name] of orthopedics. The original surgery was on 04/17/2019 and it was felt that she had septic arthritis. She is still having a lot of pain and swelling in the right elbow and apparently is due to see Dr. Megan Salon tomorrow and what she thinks is that she will be restarted on antibiotics. With regards to her heel wounds/left foot wounds. Her arterial studies were checked and her ABIs were within normal limits. X-ray showed plantar foot ulcer negative for osteomyelitis postop resection of the posterior calcaneus. She has been using silver alginate to the heel 8/4-Patient returns after being seen on 7/14, we are using silver alginate to the calcaneal wound, she is continuing to receive care for her right elbow septic arthritis 8/14- Patient returns after 1 week, she is being seen by the surgical group for her right elbow, she is here for the left calcaneal wound for which we are using silver alginate this is about the same 8/21; this is a patient I readmitted to the clinic 5 weeks ago. She is continuing to have difficulties with a septic arthritis of the right elbow she had after a fall and apparently has had surgical IandD's since the last time we saw her. She is also following with  Dr. Megan Salon of infectious disease and is apparently on 3 oral  antibiotics although at the time of this dictation I am not sure what they are. She comes in today with a necrotic surface on the left great toe with a blister laterally. This was still clearly an open wound. She had purulent drainage coming out of this. The original wound on the plantar aspect of her right heel in the setting of a Charcot foot is deep not open to bone but certainly not any better at all. We have been using silver alginate 8/28; dealing with septic arthritis of the right elbow. May need to go for further surgery here in the second week of September. She had a blister on the left great toe that was purulent [place] draining last week although a culture did not grow anything [already on antibiotics through infectious disease for her elbow]. The punched-out area on her heel is just like it was when she first came in. This almost closed with a total contact cast there are no options for that now. The patient states she cannot stay off her foot having to do housework X-ray of the foot showed a moderate bunion and severe degenerative changes at the first metatarsal phalangeal joint there was a moderate plantar calcaneal spur. We will need to check the location of this. 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 It is oral. We are using silver collagen to the punched-out area on her left heel and to the summer superficial area of the left great toe. She is offloading this is best she can although judging by the amount of callus it is not enough. She thinks she has enough strength in her right elbow now to use her scooter. There is not an option for a cast 9/18; still dealing with septic arthritis of the elbow she is apparently going for an MRI and a possible procedure next week she is on  clindamycin and I have reviewed this with Dr. Hale Bogus notes and infectious disease. We are using silver collagen to the punched-out areas on her left heel and to the plantar aspect of her left great toe she is now using her scooter to get around and to help offload these areas 10/2; 2-week follow-up. Still on clindamycin I believe for the left elbow she is going for an MRI of the elbow over the weekend. She is then going to see orthopedics and infectious disease. The area on the left heel is certainly no better. This does not probe to bone however there is green drainage. She has an area on the tip of her toe. The area on the heel is in a wound we almost closed at one point with total contact casting. 10/9; one-week follow-up. Culture I did of the heel last week which was a swab culture admittedly showed moderate Enterococcus faecalis moderate Pseudomonas. The wound itself looks about the same. There is no palpable bone although the depth of it closely approximates bone. The heel is swollen but not erythematous. Her MRI is booked for next week. She also has an MRI of the right elbow. I've also lifted the last consult from Dr. Megan Salon who is following her for septic arthritis of the right elbow. I did not see him specifically comment on her left heel however the patient states that he is aware of this. I did not specifically address the organisms I cultured last  week because of the possible effect of any additional cultures that will be done on the elbow. Additionally these were superficial not bone cultures 10/16; her MRI of the heel was put back to next week. She did have her elbow done. I have been in contact with Dr. Megan Salon about my concerns about osteomyelitis of the left heel. We have been using collagen. She also has a wound on the plantar tip of her first toe 10/23; her MRI of the heel was negative for osteomyelitis but suggested a small abscess. She also had an MRI of her elbow that  showed findings compatible with a septic joint. She went to see Dr. Megan Salon and she is going to have both oral and IV antibiotics which I am sure will cover any infection in the heel. I did not actually see his note today. We are using silver collagen offloading the heel with a heel offloading boot 11/6; patient continues with silver collagen to both wound areas on her plantar left heel and plantar left great toe. She remains on daptomycin by Dr. Megan Salon of infectious disease. She complains of diarrhea. Dr. Megan Salon is aware of this. 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 she has been put on oral vancomycin I think a lot of high clinical suspicion for pseudomembranous colitis Because of the right elbow there are no options to completely off her left foot beyond the heel offloading boot we have now. Fortunately the left heel ulcer itself has remained static some improvement in the plantar left great toe 11/20; patient arrives for review of her left heel ulcer. I also note she has a difficult time with septic arthritis of the right elbow. She currently is on IV daptomycin which seems to have helped the drainage in her elbow [MSSA]. Because of high concern for pseudomembranous colitis she was also put on vancomycin orally and Flagyl orally. She was on Levaquin but that was discontinued because of the concern about C. difficile. She states her diarrhea is better. She arrives in clinic today with a large area of denuded skin on the lateral part of the heel. This had totally separated. We have been using silver alginate to the wound areas. She arrived in a scooter telling me she is offloading the foot is much as possible. 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 Dr. Megan Salon stopped the daptomycin and now has her on vancomycin 1000 mg IV daily. This is supposed to go onto 1/18. She is being referred to [facility] for what sounds like a elbow replacement type operation for her MSSA septic arthritis. She arrives in clinic today with a blister on the medial part of the wound on her heel. She still has a lot of callus around the heel although the surface of the wound on the left heel looks somewhat better. READMISSION 03/19/2020 Mrs. Chiao is a 64 year old woman we have followed for almost all of 2020 with a neuropathic wound on her left heel and the tip of her left great toe.. We she had previously had a partial calcanectomy by Dr. Sharol Given because of underlying osteomyelitis. She also had a history of MRSA even when we admitted her to the clinic. An  MRI when she first came into our clinic did not show osteomyelitis. We put her in a total contact cast and gradually this contracted to the point it was almost closed however in that same timeframe she had a fall. 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 We had to stop putting her in a cast partially because of infection and partially because she could not support herself with the elbow. Things went progressively downhill. When we last saw her at the end of December she had a large open wound on the left heel. She had had recurrent infections in the right elbow. It was felt that either the heel lift: Ice her elbow or vice versa. We never had MRSA I do not believe cultured in the left heel however. 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 She underwent an operative debridement and a T11-L3 fusion. An operative culture grew Pseudomonas. She was treated with cefepime and required readmission to the hospital with acute kidney failure  requiring temporary dialysis. She was discharged to rehab I believe she signed herself out [place]. He has been following with her primary doctor and she comes in the clinic today with miraculously the left heel totally closed. She is followed up with Dr. Megan Salon of infectious disease he does not feel she has any evidence of infection in her elbow or her heel for that matter and the heel is actually fully epithelialized. She is on ciprofloxacin 500 twice daily I think mostly directed at Pseudomonas. The patient is convinced that it was the treatment of the Pseudomonas in her back that ultimately led to closure of her heel and that certainly possible or could have been because she was nonambulatory in the hospital for 3 months according to her. She is still using her heel offloading boot and she says she is "learning to walk again" Patient History Information obtained from Patient. Allergies Fish Containing Products (Reaction: hives, swelling), mushroom (Severity: Severe, Reaction: anaphylaxis), penicillin (Severity: Severe, Reaction: throat swelling), tomato, raw (Reaction: hives), rosemary (Severity: Severe, Reaction: anaphylaxis), Shellfish Containing Products (Severity: Severe, Reaction: hives, swelling), aloe vera (Reaction: rash), bee venom protein (honey bee) (Severity: Moderate, Reaction: hives, swelling) Family History Diabetes - Maternal Grandparents,Paternal Grandparents,Father,Siblings, Heart Disease - Mother,Father,Siblings, Hypertension - Father, No family history of Cancer, Hereditary Spherocytosis, Kidney Disease, Lung Disease, Seizures, Stroke, Thyroid Problems, Tuberculosis. Social History Never smoker, Marital Status - Married, Alcohol Use - Never, Drug Use - No History, Caffeine Use - Moderate - tea. Medical History Eyes Patient has history of Cataracts - left eye removed, mild right eye Denies history of Glaucoma, Optic Neuritis Ear/Nose/Mouth/Throat Denies  history of Chronic sinus problems/congestion, Middle ear problems Hematologic/Lymphatic Patient has history of Anemia Denies history of Hemophilia, Human Immunodeficiency Virus, Lymphedema, Sickle Cell Disease Respiratory Denies history of Aspiration, Asthma, Chronic Obstructive Pulmonary Disease (COPD), Pneumothorax, Sleep Apnea, Tuberculosis Cardiovascular Patient has history of Coronary Artery Disease, Hypertension, Myocardial Infarction Gastrointestinal Denies history of Cirrhosis , Colitis, Crohnoos, Hepatitis A, Hepatitis B, Hepatitis C Endocrine Patient has history of Type II Diabetes Denies history of Type I Diabetes Immunological Denies history of Lupus Erythematosus, Raynaudoos, Scleroderma Integumentary (Skin) Denies history of History of Burn Musculoskeletal Patient has history of Osteoarthritis, Osteomyelitis - kx left calcaneus Denies history of Gout, Rheumatoid Arthritis Neurologic Patient has history of Neuropathy Denies history of Dementia, Quadriplegia, Paraplegia, Seizure Disorder Oncologic Denies history of Received Chemotherapy, Received Radiation Psychiatric  Denies history of Anorexia/bulimia, Confinement Anxiety Hospitalization/Surgery History - left heel surgery. - hysterectomy. - cholecystectomy. - right elbow surgery x4. Medical A Surgical History Notes nd Constitutional Symptoms (General Health) morbid obesity, fibromyalgia Eyes macular degeneration left eye Ear/Nose/Mouth/Throat seasonal allergies Cardiovascular hyperlipidemia Gastrointestinal fatty liver Genitourinary CKD stage 3 Neurologic cervical ruptured disc Review of Systems (ROS) Constitutional Symptoms (General Health) Denies complaints or symptoms of Fatigue, Fever, Chills, Marked Weight Change. Eyes Denies complaints or symptoms of Dry Eyes, Vision Changes, Glasses / Contacts. Ear/Nose/Mouth/Throat Denies complaints or symptoms of Chronic sinus problems or  rhinitis. Respiratory Denies complaints or symptoms of Chronic or frequent coughs, Shortness of Breath. Cardiovascular Denies complaints or symptoms of Chest pain. Gastrointestinal Denies complaints or symptoms of Frequent diarrhea, Nausea, Vomiting. Endocrine Denies complaints or symptoms of Heat/cold intolerance. Integumentary (Skin) Denies complaints or symptoms of Wounds. Musculoskeletal Denies complaints or symptoms of Muscle Pain, Muscle Weakness. Neurologic Denies complaints or symptoms of Numbness/parasthesias. Psychiatric Denies complaints or symptoms of Claustrophobia, Suicidal. Objective Constitutional Sitting or standing Blood Pressure is within target range for patient.. Pulse regular and within target range for patient.[name] Kitchen Respirations regular, non-labored and within target range.. Temperature is normal and within the target range for the patient.[name] Kitchen Appears in no distress. Vitals Time Taken: 3:30 PM, Height: 61 in, Source: Stated, Weight: 175 lbs, Source: Stated, BMI: 33.1, Temperature: 98.4 F, Pulse: 89 bpm, Respiratory Rate: 18 breaths/min, Blood Pressure: 131/58 mmHg. Cardiovascular Pedal pulses are palpable. General Notes: The area on her plantar left heel is completely closed. Fully epithelialized nontender no erythema no drainage. She has very significant neuropathy I do not know that she has a lot of sensation in this area but nothing is present that really looks particularly ominous. 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 I had told her I think it makes sense to me to wear  the heel offloading boot for another month to make sure everything stays intact. If everything is stable at that time she can transition to her own footwear perhaps with a heel cup to offload the areas. She can certainly buy these online or in certain drugstores. 2. The patient and her husband are quite convinced that it was the treatment of the gram-negative Pseudomonas infection in her back that ultimately led to closure in her heel. We did have gram-negative's in this area by culture in late 2020 although we always gave her empiric oral treatment and then her treatment was more focused on the MRSA in her elbow. I wonder if it was the prolonged lack of ambulation while she was in the hospital that ultimately led to closure here perhaps both. 3. Very excited for this woman who went through an awful lot in 2020. She looks really quite healthy. I am hopeful that the infection in all 1 is kept under control and she is able to get up to a fully functional lifestyle. Electronic Signature(s) Signed: 03/19/2020 6:00:23 PM By: Linton Ham MD Entered By: Linton Ham on 03/19/2020 16:32:19 -------------------------------------------------------------------------------- HxROS Details Patient Name: Date of Service: [name] [name], [name] [name] 03/19/2020 2:45 PM Medical Record Number: [id number] Patient Account Number: [id number] Date  of Birth/Sex: Treating RN: July 06, 1956 (64 y.o. Orvan Falconer Primary Care Provider: LO WNE CHA SE, YV O NNE Other Clinician: Referring Provider: Treating Provider/Extender: Linton Ham LO WNE CHA SE, YV O NNE Weeks in Treatment: 0 Information Obtained From Patient Constitutional Symptoms (General Health) Complaints and Symptoms: Negative for: Fatigue; Fever; Chills; Marked Weight Change Medical History: Past Medical History Notes: morbid obesity, fibromyalgia Eyes Complaints and Symptoms: Negative for: Dry Eyes; Vision Changes; Glasses / Contacts Medical  History: Positive for: Cataracts - left eye removed, mild right eye Negative for: Glaucoma; Optic Neuritis Past Medical History Notes: macular degeneration left eye Ear/Nose/Mouth/Throat Complaints and Symptoms: Negative for: Chronic sinus problems or rhinitis Medical History: Negative for: Chronic sinus problems/congestion; Middle ear problems Past Medical History Notes: seasonal allergies Respiratory Complaints and Symptoms: Negative for: Chronic or frequent coughs; Shortness of Breath Medical History: Negative for: Aspiration; Asthma; Chronic Obstructive Pulmonary Disease (COPD); Pneumothorax; Sleep Apnea; Tuberculosis Cardiovascular Complaints and Symptoms: Negative for: Chest pain Medical History: Positive for: Coronary Artery Disease; Hypertension; Myocardial Infarction Past Medical History Notes: hyperlipidemia Gastrointestinal Complaints and Symptoms: Negative for: Frequent diarrhea; Nausea; Vomiting Medical History: Negative for: Cirrhosis ; Colitis; Crohns; Hepatitis A; Hepatitis B; Hepatitis C Past Medical History Notes: fatty liver Endocrine Complaints and Symptoms: Negative for: Heat/cold intolerance Medical History: Positive for: Type II Diabetes Negative for: Type I Diabetes Time with diabetes: 9 yrs Treated with: Insulin Blood sugar tested every day: Yes T ested : 3 times per day Blood sugar testing results: Breakfast: 100; Lunch: 120; Dinner: 120-135 Integumentary (Skin) Complaints and Symptoms: Negative for: Wounds Medical History: Negative for: History of Burn Musculoskeletal Complaints and Symptoms: Negative for: Muscle Pain; Muscle Weakness Medical History: Positive for: Osteoarthritis; Osteomyelitis - kx left calcaneus Negative for: Gout; Rheumatoid Arthritis Neurologic Complaints and Symptoms: Negative for: Numbness/parasthesias Medical History: Positive for: Neuropathy Negative for: Dementia; Quadriplegia; Paraplegia; Seizure  Disorder Past Medical History Notes: cervical ruptured disc Psychiatric Complaints and Symptoms: Negative for: Claustrophobia; Suicidal Medical History: Negative for: Anorexia/bulimia; Confinement Anxiety Hematologic/Lymphatic Medical History: Positive for: Anemia Negative for: Hemophilia; Human Immunodeficiency Virus; Lymphedema; Sickle Cell Disease Genitourinary Medical History: Past Medical History Notes: CKD stage 3 Immunological Medical History: Negative for: Lupus Erythematosus; Raynauds; Scleroderma Oncologic Medical History: Negative for: Received Chemotherapy; Received Radiation HBO Extended History Items Eyes: Cataracts Immunizations Pneumococcal Vaccine: Received Pneumococcal Vaccination: Yes Implantable Devices No devices added Hospitalization / Surgery History Type of Hospitalization/Surgery left heel surgery hysterectomy cholecystectomy right elbow surgery x4 Family and Social History Cancer: No; Diabetes: Yes - Maternal Grandparents,Paternal Grandparents,Father,Siblings; Heart Disease: Yes - Mother,Father,Siblings; Hereditary Spherocytosis: No; Hypertension: Yes - Father; Kidney Disease: No; Lung Disease: No; Seizures: No; Stroke: No; Thyroid Problems: No; Tuberculosis: No; Never smoker; Marital Status - Married; Alcohol Use: Never; Drug Use: No History; Caffeine Use: Moderate - tea; Financial Concerns: No; Food, Clothing or Shelter Needs: No; Support System Lacking: No; Transportation Concerns: No Engineer, maintenance) Signed: 03/19/2020 6:00:23 PM By: Linton Ham MD Signed: 03/23/2020 5:17:39 PM By: Carlene Coria RN Entered By: Carlene Coria on 03/19/2020 15:31:39 -------------------------------------------------------------------------------- SuperBill Details Patient Name: Date of Service: [name] [name], [name] [name] 03/19/2020 Medical Record Number: [id number] Patient Account Number: [id number] Date of Birth/Sex: Treating RN: 1956/10/25 (64 y.o. Clearnce Sorrel Primary Care Provider: LO WNE CHA SE, YV O NNE Other Clinician: Referring Provider: Treating Provider/Extender: Linton Ham LO WNE CHA SE, YV O NNE Weeks in Treatment: 0 Diagnosis Coding ICD-10 Codes Code Description E11.621 Type 2 diabetes mellitus with foot  ulcer L97.528 Non-pressure chronic ulcer of other part of left foot with other specified severity E11.42 Type 2 diabetes mellitus with diabetic polyneuropathy Facility Procedures CPT4 Code: [id number] Description: 92780 - WOUND CARE VISIT-LEV 3 EST PT Modifier: Quantity: 1 Physician Procedures : CPT4 Code Description Modifier [id number] - WC PHYS LEVEL 3 - EST PT ICD-10 Diagnosis Description E11.621 Type 2 diabetes mellitus with foot ulcer L97.528 Non-pressure chronic ulcer of other part of left foot with other specified severity E11.42  Type 2 diabetes mellitus with diabetic polyneuropathy Quantity: 1 Electronic Signature(s) Signed: 03/19/2020 6:00:23 PM By: Linton Ham MD Entered By: Linton Ham on 03/19/2020 16:32:36

## 2020-03-24 ENCOUNTER — Other Ambulatory Visit: Payer: Self-pay

## 2020-03-24 DIAGNOSIS — I82503 Chronic embolism and thrombosis of unspecified deep veins of lower extremity, bilateral: Secondary | ICD-10-CM

## 2020-03-24 NOTE — Telephone Encounter (Signed)
Referral placed.

## 2020-04-05 ENCOUNTER — Encounter: Payer: Self-pay | Admitting: Internal Medicine

## 2020-04-05 ENCOUNTER — Ambulatory Visit (INDEPENDENT_AMBULATORY_CARE_PROVIDER_SITE_OTHER): Payer: PRIVATE HEALTH INSURANCE | Admitting: Internal Medicine

## 2020-04-05 ENCOUNTER — Other Ambulatory Visit: Payer: Self-pay

## 2020-04-05 DIAGNOSIS — M00021 Staphylococcal arthritis, right elbow: Secondary | ICD-10-CM

## 2020-04-05 DIAGNOSIS — M4625 Osteomyelitis of vertebra, thoracolumbar region: Secondary | ICD-10-CM

## 2020-04-05 DIAGNOSIS — L97421 Non-pressure chronic ulcer of left heel and midfoot limited to breakdown of skin: Secondary | ICD-10-CM

## 2020-04-05 DIAGNOSIS — R197 Diarrhea, unspecified: Secondary | ICD-10-CM

## 2020-04-05 NOTE — Assessment & Plan Note (Signed)
She has no evidence of relapsed infection.

## 2020-04-05 NOTE — Assessment & Plan Note (Signed)
I will screen her for C. difficile toxin.

## 2020-04-05 NOTE — Progress Notes (Signed)
[facility] Infectious [facility]  Patient Active Problem List   Diagnosis Date Noted  . S/P lumbar fusion 03/04/2020    Priority: High  . Osteomyelitis of vertebra of thoracolumbar region [facility]) 01/21/2020    Priority: High  . AKI (acute kidney injury) ([place]) 11/15/2019    Priority: High  . Diarrhea 09/04/2019    Priority: Medium  . Septic arthritis of elbow, right ([place]) 04/17/2019    Priority: Medium  . Ulcer of left heel, limited to breakdown of skin ([place]) 02/01/2017    Priority: Medium  . 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 Spinal stenosis of lumbar region 01/22/2020  . Back pain 11/17/2019  . Compression fracture of L1 lumbar vertebra ([place]) 11/17/2019  . Fracture of multiple ribs 11/17/2019  . Intractable nausea and vomiting 11/16/2019  . Nausea & vomiting 11/15/2019  . Pressure injury of skin 04/17/2019  . 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 Sleep apnea 11/16/2017  . Diabetic foot infection ([place]) 04/06/2017  . Type 2 diabetes mellitus with hyperglycemia, with long-term current use of insulin ([place]) 04/06/2017  . Neuropathy 05/05/2015  . Diabetic peripheral neuropathy ([place]) 01/12/2015  . CKD (chronic kidney disease), stage III ([place]) 05/27/2014  . History of MI (myocardial infarction) 10/06/2013  . Nausea and vomiting 10/06/2013  . Obesity (BMI 30-39.9) 09/20/2013  . 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 Normocytic anemia 02/03/2013  . CAD (coronary artery disease) 01/11/2012  . Hepatic cirrhosis ([place]) 07/06/2010  . 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  . Hyperlipidemia 06/03/2008  . Essential hypertension 12/23/2006    Patient's Medications  New Prescriptions   No medications on file  Previous Medications   ACETAMINOPHEN (TYLENOL) 325 MG TABLET    Take 2 tablets (650 mg total) by mouth every 6 (six) hours as needed for fever.   ATORVASTATIN (LIPITOR) 10 MG TABLET    Take 1 tablet by  mouth once daily   BETHANECHOL (URECHOLINE) 10 MG TABLET    Take 1 tablet (10 mg total) by mouth 3 (three) times daily.   CIPROFLOXACIN (CIPRO) 500 MG TABLET    Take 1 tablet (500 mg total) by mouth daily.   CONTINUOUS BLOOD GLUC RECEIVER (FREESTYLE LIBRE 14 DAY READER) DEVI    1 Device by Does not apply route daily.   CONTINUOUS BLOOD GLUC SENSOR (FREESTYLE LIBRE 14 DAY SENSOR) MISC    1 Device by Does not apply route daily.   DIAZEPAM (VALIUM) 5 MG TABLET    Take 1 tablet (5 mg total) by mouth every 12 (twelve) hours as needed for anxiety.   DULOXETINE (CYMBALTA) 60 MG CAPSULE    Take 1 capsule (60 mg total) by mouth daily.   FUROSEMIDE (LASIX) 10 MG/ML INJECTION    Inject 8 mLs (80 mg total) into the vein 2 (two) times daily.   HEPARIN 25000-0.45 UT/250ML-% INFUSION    Inject 1,200 Units/hr into the vein continuous.   HYDROCODONE-ACETAMINOPHEN (NORCO/VICODIN) 5-325 MG TABLET    Take 1 tablet by mouth every 6 (six) hours as needed for moderate pain.   INSULIN ASPART (NOVOLOG) 100 UNIT/ML INJECTION    Inject 0-5 Units into the skin at bedtime.   INSULIN ASPART (NOVOLOG) 100 UNIT/ML INJECTION    Inject 0-9 Units into the skin 3 (three) times daily with meals.   LIDOCAINE (LIDODERM) 5 %    Place 1 patch onto the skin daily. Remove & Discard patch within 12  hours or as directed by MD   MEROPENEM 1 G IN SODIUM CHLORIDE 0.9 % 100 ML    Inject 1 g into the vein daily.   MULTIPLE VITAMIN (MULTIVITAMIN WITH MINERALS) TABS TABLET    Take 1 tablet by mouth daily.   OXYCODONE (OXY IR/ROXICODONE) 5 MG IMMEDIATE RELEASE TABLET    Take 1 tablet (5 mg total) by mouth every 3 (three) hours as needed for moderate pain ((score 4 to 6)).   PANTOPRAZOLE (PROTONIX) 40 MG TABLET    Take 1 tablet (40 mg total) by mouth daily at 6 (six) AM.   PREGABALIN (LYRICA) 75 MG CAPSULE    Take 1 capsule (75 mg total) by mouth at bedtime.   RIVAROXABAN (XARELTO) 15 MG TABS TABLET    Take 1 tablet (15 mg total) by mouth daily with  supper.   TRAZODONE (DESYREL) 50 MG TABLET    Take 1 tablet (50 mg total) by mouth at bedtime.  Modified Medications   No medications on file  Discontinued Medications   No medications on file    Subjective: [name] [name] is in with her husband for her hospital follow-up visit.  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  She received long courses of IV antibiotics throughout the last 6 months of last year.  When hospitalized in January she was switched to oral doxycycline for chronic suppression.  She missed her follow-up visit with me in February.  Unbeknownst to me she could not tolerate doxycycline and stopped it shortly after discharge in January. She has also been treated for polymicrobial left heel infection.  Previous cultures have grown Enterococcus, Proteus, Klebsiella and Pseudomonas.  She began having severe low back pain last month the pain has become progressively severe and she has not been able to get out of bed.  As a result she has missed many doctor appointments.  She came to the ED 01/20/2020.  MRI revealed:  IMPRESSION: Destruction of the L1 vertebral body with partial destruction of the inferior endplates of S01 and L2 with abnormal enhancing paraspinal and epidural soft tissues at that level. No definable abscess. I suspect this represents atypical infection such as TB or actinomycosis. B-cell lymphoma can also give this appearance. There is moderate compression of the thecal sac at L1 as described above.  She underwent operative debridement and T11-L3 fusion by Dr. Duffy Rhody.  Operative cultures grew Pseudomonas.  She was started on IV cefepime.  Her hospital course was complicated by acute kidney injury requiring intermittent hemodialysis.  Her cefepime was changed to meropenem.  Her pain improved after her surgery.  She received a total of 29 days of IV antibiotic therapy before she decided to leave the hospital [place] on 02/26/2020.  She was not on any antibiotic therapy until her PCP, Dr. Rossie Muskrat, started her on ciprofloxacin on 03/02/2020.    She has developed watery diarrhea and some nausea and vomiting over the past 2 to 3 weeks.  As a result she stopped taking ciprofloxacin 5 days ago.  She had 3 bouts of diarrhea this morning but feels like she is feeling better.  She says that her back pain is getting worse and she feels like there is no swelling on her right lower back.  She is scheduled to see Dr. Marcello Moores in 1 week.  Review of Systems: Review of Systems  Constitutional: Positive for malaise/fatigue. Negative for chills, fever and weight loss.  Respiratory: Negative for cough, sputum production and shortness of breath.   Cardiovascular: Negative for chest pain.  Gastrointestinal: Positive for diarrhea, nausea and vomiting. Negative for abdominal pain.  Genitourinary: Negative for dysuria.  Musculoskeletal: Positive for back pain and joint pain.  Skin: Negative for rash.  Neurological: Negative for headaches.    Past Medical History:  Diagnosis Date  . Acute MI [facility]) 2007   presented to ED & had cardiac cath- but found to have normal coronaries. Since that point in time her PCP cares f or cardiac needs. Dr. [name] - [facility]  . Anemia   . Anginal pain ([place])   . Anxiety   . Asthma   . Bulging lumbar disc   . Cataract   . 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 Cirrhosis of liver without mention of alcohol   . Constipation   . Dehiscence of closure of skin    left partial calcaneal excision  . Depression   . Diabetes mellitus    insulin dependent, adult onset  . Episode of visual loss of left eye   . Exertional shortness of breath   . Fatty liver   . Fibromyalgia   . GERD (gastroesophageal reflux disease)   . Hepatic steatosis   . High cholesterol   . Hypertension   . MRSA (methicillin resistant Staphylococcus  aureus)   . Neuropathy    lower legs  . Osteoarthritis    hands, hips  . Proximal humerus fracture 10/15/12   Left  . PTSD (post-traumatic stress disorder)   . Renal insufficiency 05/05/2015    Social History   Tobacco Use  . Smoking status: Never Smoker  . Smokeless tobacco: Never Used  Substance Use Topics  . Alcohol use: No    Alcohol/week: 0.0 standard drinks  . Drug use: No    Family History  Problem Relation Age of Onset  . Heart disease Father   . Diabetes Father   . Colitis Father   . Crohn's disease Father   . Cancer Father        leukemia  . Leukemia Father   . Diabetes Mellitus II Brother   . Kidney disease Brother   . Heart disease Brother   . Diabetes Mother   . Hypertension Mother   . Mental illness Mother   . Irritable bowel syndrome Daughter   . Diabetes Mellitus II Brother   . Kidney disease Brother   . Liver disease Brother   . Kidney disease Brother   . Heart attack Brother   . Diabetes Mellitus II Brother   . Heart disease Brother   . Liver disease Brother   . Kidney disease Brother   . Kidney disease Brother   . Diabetes Mellitus II Brother   . Diabetes Mellitus I Brother     Allergies  Allergen Reactions  . Bee Pollen Anaphylaxis  . Fish-Derived Products Hives, Shortness Of Breath, Swelling and Rash    Hives get in throat causing trouble breathing  . Mushroom Extract Complex Anaphylaxis  . Penicillins Anaphylaxis    **Tolerated cefepime March 2021 Did it involve swelling of the face/tongue/throat, SOB, or low BP? Yes Did it involve sudden or severe rash/hives, skin peeling, or any reaction on the inside of your mouth or nose? No Did you need to seek medical attention at a hospital or doctor's office? Yes When did it last happen?A few months ago If all  above answers are "NO", may proceed with cephalosporin use.  [name] Kitchen Rosemary Oil Anaphylaxis  . Shellfish Allergy Hives, Shortness Of Breath, Swelling and Rash  . Tomato Hives and  Shortness Of Breath    Hives in throat causes her trouble breathing  . Acetaminophen Other (See Comments)    GI upset  . Acyclovir And Related Other (See Comments)    Unknown rxn  . Aloe Vera Hives  . Broccoli [Brassica Oleracea] Hives  . Naproxen Other (See Comments)    Unknown rxn    Objective: Vitals:   04/05/20 1610  BP: (!) 166/75  Pulse: 70  Temp: 97.8 F (36.6 C)  SpO2: 98%   There is no height or weight on file to calculate BMI. She is no longer in a wheelchair.  She is accompanied by her husband Her cardiac exam reveals a regular S1 and S2 and no murmurs Her lungs are clear All back incisions have healed nicely.  I do not detect any asymmetry of her lower back although she is tender to palpation just to the right of midline. The right elbow has healed with scarring.  There is no evidence of active infection She has no evidence of active infection of her left heel  Lab Results CMP     Component Value Date/Time   NA 141 03/01/2020 1345   K 4.4 03/01/2020 1345   CL 104 03/01/2020 1345   CO2 30 03/01/2020 1345   GLUCOSE 109 (H) 03/01/2020 1345   BUN 48 (H) 03/01/2020 1345   CREATININE 2.73 (H) 03/01/2020 1345   CREATININE 1.83 (H) 08/19/2019 1701   CALCIUM 8.5 03/01/2020 1345   PROT 7.1 03/01/2020 1345   ALBUMIN 3.2 (L) 03/01/2020 1345   AST 15 03/01/2020 1345   ALT 9 03/01/2020 1345   ALKPHOS 80 03/01/2020 1345   BILITOT 0.6 03/01/2020 1345   GFRNONAA 7 (L) 02/26/2020 0759   GFRAA 9 (L) 02/26/2020 0759   Sed Rate  Date Value  03/04/2020 91 mm/h (H)  02/13/2020 124 mm/hr (H)  02/10/2020 >140 mm/hr (H)   CRP  Date Value  03/04/2020 40.8 mg/L (H)  02/13/2020 8.5 mg/dL (H)  02/10/2020 8.9 mg/dL (H)     Problem List Items Addressed This Visit      High   Osteomyelitis of vertebra of thoracolumbar region [facility])    This is a very difficult situation.  She has no other oral options to treat her Pseudomonas spine infection other than ciprofloxacin but  has not been tolerating it well and recently stopped it.  This is following an abbreviated course of IV antibiotic therapy.  Her inflammatory markers remain quite elevated and I suspect that there is a very strong likelihood that she will really relapse quickly if we stop antibiotic therapy now.  I will check a C. difficile screen before deciding whether or not to restart ciprofloxacin.      Relevant Orders   C-reactive protein   Sedimentation rate     Medium   Ulcer of left heel, limited to breakdown of skin ([place])    She has no evidence of relapsed infection.      Septic arthritis of elbow, right ([place])    She has no evidence of relapsed infection of her right elbow      Diarrhea    I will screen her for C. difficile toxin.      Relevant Orders   Clostridium Difficile by PCR([facility])       Michel Bickers, MD  [facility] [place] [phone] pager   [phone] cell 04/05/2020, 5:22 PM

## 2020-04-05 NOTE — Assessment & Plan Note (Signed)
She has no evidence of relapsed infection of her right elbow

## 2020-04-05 NOTE — Assessment & Plan Note (Signed)
This is a very difficult situation.  She has no other oral options to treat her Pseudomonas spine infection other than ciprofloxacin but has not been tolerating it well and recently stopped it.  This is following an abbreviated course of IV antibiotic therapy.  Her inflammatory markers remain quite elevated and I suspect that there is a very strong likelihood that she will really relapse quickly if we stop antibiotic therapy now.  I will check a C. difficile screen before deciding whether or not to restart ciprofloxacin.

## 2020-04-06 LAB — SEDIMENTATION RATE

## 2020-04-06 LAB — SED RATE MANUAL WEST RFLX: SED RATE BY MODIFIED WESTERGREN,MANUAL: 90 mm/h — ABNORMAL HIGH (ref 0–30)

## 2020-04-06 LAB — C-REACTIVE PROTEIN: CRP: 63.4 mg/L — ABNORMAL HIGH (ref <8.0)

## 2020-04-12 ENCOUNTER — Telehealth: Payer: Self-pay | Admitting: Internal Medicine

## 2020-04-12 ENCOUNTER — Encounter: Admit: 2020-04-12 | Discharge: 2020-04-12 | Payer: MEDICARE

## 2020-04-12 NOTE — Telephone Encounter (Signed)
I called and left a phone message for [name] [name].  I asked her to call us to let us know how she is doing.  She has not brought in a stool sample for C. difficile testing.  She had stopped her Cipro prior to her visit 1 week ago.  Her inflammatory markers remain quite elevated.

## 2020-04-13 MED ORDER — BETAMETHASONE VALERATE 0.1 % TP CREA
Freq: Every day | 1 refills | 7.00000 days | Status: DC
Start: 2020-04-13 — End: 2020-06-01

## 2020-04-19 ENCOUNTER — Other Ambulatory Visit: Payer: Self-pay | Admitting: Internal Medicine

## 2020-04-19 DIAGNOSIS — L97421 Non-pressure chronic ulcer of left heel and midfoot limited to breakdown of skin: Secondary | ICD-10-CM

## 2020-04-19 MED ORDER — METRONIDAZOLE 500 MG PO TABS: 500.0000 mg | ORAL_TABLET | Freq: Three times a day (TID) | ORAL | 1 refills | Status: DC

## 2020-04-19 NOTE — Telephone Encounter (Signed)
Patient returned call from Dr. Campbell. States her diarrhea has improved since off antibiotics, but is still ongoing. Is requesting new stool kit. States she left previous one in freezer for three days.  Will provide new stool kit for patient to pick up later this week.Understands that sample should be kept at room temp.  Patient is having some concerns regarding her foot. States left foot is having smelly discharge where her foot had operation done. Has noticed swelling and foot pain since Saturday.  Patient would like to start antibiotics. Jose L Maldonado, CMA  

## 2020-04-19 NOTE — Telephone Encounter (Signed)
I have sent in a prescription for metronidazole and I would like her to take that and restart ciprofloxacin now.

## 2020-04-20 ENCOUNTER — Other Ambulatory Visit: Payer: Self-pay | Admitting: Family

## 2020-04-20 DIAGNOSIS — I82503 Chronic embolism and thrombosis of unspecified deep veins of lower extremity, bilateral: Secondary | ICD-10-CM

## 2020-04-20 DIAGNOSIS — D6859 Other primary thrombophilia: Secondary | ICD-10-CM

## 2020-04-21 ENCOUNTER — Inpatient Hospital Stay: Payer: PRIVATE HEALTH INSURANCE | Attending: Hematology & Oncology

## 2020-04-21 ENCOUNTER — Other Ambulatory Visit: Payer: Self-pay

## 2020-04-21 ENCOUNTER — Encounter: Payer: Self-pay | Admitting: Family

## 2020-04-21 ENCOUNTER — Inpatient Hospital Stay (HOSPITAL_BASED_OUTPATIENT_CLINIC_OR_DEPARTMENT_OTHER): Payer: PRIVATE HEALTH INSURANCE | Admitting: Family

## 2020-04-21 VITALS — BP 151/76 | HR 81 | Temp 98.1°F | Resp 18 | Ht 62.0 in

## 2020-04-21 DIAGNOSIS — Z7901 Long term (current) use of anticoagulants: Secondary | ICD-10-CM

## 2020-04-21 DIAGNOSIS — K219 Gastro-esophageal reflux disease without esophagitis: Secondary | ICD-10-CM | POA: Insufficient documentation

## 2020-04-21 DIAGNOSIS — Z90710 Acquired absence of both cervix and uterus: Secondary | ICD-10-CM

## 2020-04-21 DIAGNOSIS — I10 Essential (primary) hypertension: Secondary | ICD-10-CM

## 2020-04-21 DIAGNOSIS — I82503 Chronic embolism and thrombosis of unspecified deep veins of lower extremity, bilateral: Secondary | ICD-10-CM

## 2020-04-21 DIAGNOSIS — Z8249 Family history of ischemic heart disease and other diseases of the circulatory system: Secondary | ICD-10-CM

## 2020-04-21 DIAGNOSIS — Z794 Long term (current) use of insulin: Secondary | ICD-10-CM | POA: Diagnosis not present

## 2020-04-21 DIAGNOSIS — N189 Chronic kidney disease, unspecified: Secondary | ICD-10-CM | POA: Diagnosis not present

## 2020-04-21 DIAGNOSIS — Z79899 Other long term (current) drug therapy: Secondary | ICD-10-CM

## 2020-04-21 DIAGNOSIS — Z833 Family history of diabetes mellitus: Secondary | ICD-10-CM | POA: Diagnosis not present

## 2020-04-21 DIAGNOSIS — Z841 Family history of disorders of kidney and ureter: Secondary | ICD-10-CM | POA: Diagnosis not present

## 2020-04-21 DIAGNOSIS — D649 Anemia, unspecified: Secondary | ICD-10-CM

## 2020-04-21 DIAGNOSIS — Z806 Family history of leukemia: Secondary | ICD-10-CM | POA: Insufficient documentation

## 2020-04-21 DIAGNOSIS — E7800 Pure hypercholesterolemia, unspecified: Secondary | ICD-10-CM

## 2020-04-21 DIAGNOSIS — F419 Anxiety disorder, unspecified: Secondary | ICD-10-CM | POA: Insufficient documentation

## 2020-04-21 DIAGNOSIS — G629 Polyneuropathy, unspecified: Secondary | ICD-10-CM | POA: Diagnosis not present

## 2020-04-21 DIAGNOSIS — F329 Major depressive disorder, single episode, unspecified: Secondary | ICD-10-CM | POA: Diagnosis not present

## 2020-04-21 DIAGNOSIS — D6859 Other primary thrombophilia: Secondary | ICD-10-CM

## 2020-04-21 DIAGNOSIS — R7982 Elevated C-reactive protein (CRP): Secondary | ICD-10-CM | POA: Diagnosis not present

## 2020-04-21 DIAGNOSIS — Z90721 Acquired absence of ovaries, unilateral: Secondary | ICD-10-CM | POA: Insufficient documentation

## 2020-04-21 DIAGNOSIS — J45909 Unspecified asthma, uncomplicated: Secondary | ICD-10-CM

## 2020-04-21 DIAGNOSIS — E119 Type 2 diabetes mellitus without complications: Secondary | ICD-10-CM

## 2020-04-21 LAB — CBC WITH DIFFERENTIAL (CANCER CENTER ONLY)
Abs Immature Granulocytes: 0.01 10*3/uL (ref 0.00–0.07)
Basophils Absolute: 0.1 10*3/uL (ref 0.0–0.1)
Basophils Relative: 1 %
Eosinophils Absolute: 0.2 10*3/uL (ref 0.0–0.5)
Eosinophils Relative: 3 %
HCT: 26.4 % — ABNORMAL LOW (ref 36.0–46.0)
Hemoglobin: 7.9 g/dL — ABNORMAL LOW (ref 12.0–15.0)
Immature Granulocytes: 0 %
Lymphocytes Relative: 24 %
Lymphs Abs: 1.6 10*3/uL (ref 0.7–4.0)
MCH: 25.5 pg — ABNORMAL LOW (ref 26.0–34.0)
MCHC: 29.9 g/dL — ABNORMAL LOW (ref 30.0–36.0)
MCV: 85.2 fL (ref 80.0–100.0)
Monocytes Absolute: 0.5 10*3/uL (ref 0.1–1.0)
Monocytes Relative: 7 %
Neutro Abs: 4.2 10*3/uL (ref 1.7–7.7)
Neutrophils Relative %: 65 %
Platelet Count: 201 10*3/uL (ref 150–400)
RBC: 3.1 MIL/uL — ABNORMAL LOW (ref 3.87–5.11)
RDW: 16.1 % — ABNORMAL HIGH (ref 11.5–15.5)
WBC Count: 6.5 10*3/uL (ref 4.0–10.5)
nRBC: 0 % (ref 0.0–0.2)

## 2020-04-21 LAB — CMP (CANCER CENTER ONLY)
ALT: 7 U/L (ref 0–44)
AST: 13 U/L — ABNORMAL LOW (ref 15–41)
Albumin: 3.4 g/dL — ABNORMAL LOW (ref 3.5–5.0)
Alkaline Phosphatase: 102 U/L (ref 38–126)
Anion gap: 7 (ref 5–15)
BUN: 23 mg/dL (ref 8–23)
CO2: 26 mmol/L (ref 22–32)
Calcium: 9.4 mg/dL (ref 8.9–10.3)
Chloride: 107 mmol/L (ref 98–111)
Creatinine: 1.91 mg/dL — ABNORMAL HIGH (ref 0.44–1.00)
GFR, Est AFR Am: 32 mL/min — ABNORMAL LOW (ref >60)
GFR, Estimated: 27 mL/min — ABNORMAL LOW (ref >60)
Glucose, Bld: 116 mg/dL — ABNORMAL HIGH (ref 70–99)
Potassium: 4.6 mmol/L (ref 3.5–5.1)
Sodium: 140 mmol/L (ref 135–145)
Total Bilirubin: 0.4 mg/dL (ref 0.3–1.2)
Total Protein: 8.2 g/dL — ABNORMAL HIGH (ref 6.5–8.1)

## 2020-04-21 LAB — D-DIMER, QUANTITATIVE: D-Dimer, Quant: 3.58 ug/mL-FEU — ABNORMAL HIGH (ref 0.00–0.50)

## 2020-04-21 LAB — SAVE SMEAR(SSMR), FOR PROVIDER SLIDE REVIEW

## 2020-04-21 LAB — ANTITHROMBIN III: AntiThromb III Func: 92 % (ref 75–120)
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
## 2020-04-22 ENCOUNTER — Telehealth: Payer: Self-pay | Admitting: Family Medicine

## 2020-04-22 ENCOUNTER — Telehealth: Payer: Self-pay | Admitting: Family

## 2020-04-22 ENCOUNTER — Other Ambulatory Visit: Payer: Self-pay | Admitting: Family Medicine

## 2020-04-22 ENCOUNTER — Encounter: Admit: 2020-04-22 | Discharge: 2020-04-22 | Payer: MEDICARE

## 2020-04-22 DIAGNOSIS — G894 Chronic pain syndrome: Secondary | ICD-10-CM

## 2020-04-22 DIAGNOSIS — Z981 Arthrodesis status: Secondary | ICD-10-CM

## 2020-04-22 DIAGNOSIS — M8610 Other acute osteomyelitis, unspecified site: Secondary | ICD-10-CM

## 2020-04-22 LAB — LACTATE DEHYDROGENASE: LDH: 261 U/L — ABNORMAL HIGH (ref 98–192)

## 2020-04-22 LAB — RETICULOCYTES
Immature Retic Fract: 21.9 % — ABNORMAL HIGH (ref 2.3–15.9)
RBC.: 3.07 MIL/uL — ABNORMAL LOW (ref 3.87–5.11)
Retic Count, Absolute: 84.4 10*3/uL (ref 19.0–186.0)
Retic Ct Pct: 2.8 % (ref 0.4–3.1)

## 2020-04-22 LAB — FERRITIN: Ferritin: 429 ng/mL — ABNORMAL HIGH (ref 11–307)

## 2020-04-22 LAB — IRON AND TIBC
Iron: 32 ug/dL — ABNORMAL LOW (ref 41–142)
Saturation Ratios: 14 % — ABNORMAL LOW (ref 21–57)
TIBC: 228 ug/dL — ABNORMAL LOW (ref 236–444)
UIBC: 196 ug/dL (ref 120–384)

## 2020-04-22 MED ORDER — HYDROCODONE-ACETAMINOPHEN 5-325 MG PO TABS: 1.0000 | ORAL_TABLET | Freq: Four times a day (QID) | ORAL | 0 refills | Status: DC | PRN

## 2020-04-22 NOTE — Telephone Encounter (Signed)
Oxycodone refill.   Last OV: 03/15/2020 Last Fill: 02/18/2020 #30 and 7QO Pt sig: 1 tab tid prn UDS: 05/06/2018 Low risk

## 2020-04-22 NOTE — Telephone Encounter (Signed)
Medication: oxyCODONE (OXY IR/ROXICODONE) 5 MG immediate release tablet [[id number]]    Has the patient contacted their pharmacy? No. (If no, request that the patient contact the pharmacy for the refill.) (If yes, when and what did the pharmacy advise?)  Preferred Pharmacy (with phone number or street name):  [facility] - [address] [place], [place] - [address] Phone:  [phone]  Fax:  [phone]       Agent: Please be advised that RX refills may take up to 3 business days. We ask that you follow-up with your pharmacy.

## 2020-04-22 NOTE — Telephone Encounter (Signed)
No los 6/23

## 2020-04-23 LAB — BETA-2-GLYCOPROTEIN I ABS, IGG/M/A
Beta-2 Glyco I IgG: <9 GPI IgG units (ref 0–20)
Beta-2-Glycoprotein I IgA: <9 GPI IgA units (ref 0–25)
Beta-2-Glycoprotein I IgM: <9 GPI IgM units (ref 0–32)

## 2020-04-23 LAB — CARDIOLIPIN ANTIBODIES, IGG, IGM, IGA
Anticardiolipin IgA: <9 APL U/mL (ref 0–11)
Anticardiolipin IgG: 17 GPL U/mL — ABNORMAL HIGH (ref 0–14)
Anticardiolipin IgM: <9 MPL U/mL (ref 0–12)

## 2020-04-23 LAB — ERYTHROPOIETIN: Erythropoietin: 23.7 m[IU]/mL — ABNORMAL HIGH (ref 2.6–18.5)

## 2020-04-23 LAB — HOMOCYSTEINE: Homocysteine: 21.4 umol/L — ABNORMAL HIGH (ref 0.0–17.2)

## 2020-04-23 MED ORDER — FAMOTIDINE 20 MG PO TAB
ORAL_TABLET | Freq: Two times a day (BID) | ORAL | 2 refills | 90.00000 days | Status: AC
Start: 2020-04-23 — End: ?

## 2020-04-24 LAB — PROTEIN C, TOTAL: Protein C, Total: 83 % (ref 60–150)

## 2020-04-26 LAB — PROTEIN S ACTIVITY: Protein S Activity: 115 % (ref 63–140)

## 2020-04-26 LAB — LUPUS ANTICOAGULANT PANEL
DRVVT: 46.4 s (ref 0.0–47.0)
PTT Lupus Anticoagulant: 36.8 s (ref 0.0–51.9)

## 2020-04-26 LAB — PROTEIN C ACTIVITY: Protein C Activity: 99 % (ref 73–180)

## 2020-04-26 LAB — PROTEIN S, TOTAL: Protein S Ag, Total: 114 % (ref 60–150)

## 2020-04-26 LAB — PROTHROMBIN GENE MUTATION

## 2020-04-27 ENCOUNTER — Ambulatory Visit: Payer: PRIVATE HEALTH INSURANCE | Admitting: Family

## 2020-04-27 ENCOUNTER — Other Ambulatory Visit: Payer: PRIVATE HEALTH INSURANCE

## 2020-05-04 ENCOUNTER — Telehealth: Payer: Self-pay | Admitting: Family

## 2020-05-04 ENCOUNTER — Telehealth: Payer: Self-pay | Admitting: *Deleted

## 2020-05-04 ENCOUNTER — Other Ambulatory Visit: Payer: Self-pay | Admitting: Family

## 2020-05-04 DIAGNOSIS — D509 Iron deficiency anemia, unspecified: Secondary | ICD-10-CM | POA: Insufficient documentation

## 2020-05-04 DIAGNOSIS — D6859 Other primary thrombophilia: Secondary | ICD-10-CM

## 2020-05-04 DIAGNOSIS — D508 Other iron deficiency anemias: Secondary | ICD-10-CM

## 2020-05-04 LAB — FACTOR 5 LEIDEN

## 2020-05-04 NOTE — Telephone Encounter (Signed)
I left a message with call back number to go over results and treatment plan on patients voicemail. Will await call back.

## 2020-05-04 NOTE — Telephone Encounter (Signed)
Patient called and left a message stating,"I had labs drawn two weeks ago, and I haven't heard anything. Return number is [phone]

## 2020-05-10 ENCOUNTER — Other Ambulatory Visit: Payer: Self-pay

## 2020-05-10 ENCOUNTER — Inpatient Hospital Stay: Payer: PRIVATE HEALTH INSURANCE | Attending: Hematology & Oncology

## 2020-05-10 VITALS — BP 129/60 | HR 77 | Temp 98.7°F | Resp 17

## 2020-05-10 DIAGNOSIS — Z86718 Personal history of other venous thrombosis and embolism: Secondary | ICD-10-CM | POA: Insufficient documentation

## 2020-05-10 DIAGNOSIS — D508 Other iron deficiency anemias: Secondary | ICD-10-CM

## 2020-05-10 DIAGNOSIS — D509 Iron deficiency anemia, unspecified: Secondary | ICD-10-CM | POA: Insufficient documentation

## 2020-05-10 MED ORDER — SODIUM CHLORIDE 0.9 % IV SOLN
Freq: Once | INTRAVENOUS | Status: AC
  Filled 2020-05-10: qty 250

## 2020-05-10 MED ORDER — SODIUM CHLORIDE 0.9 % IV SOLN
200.0000 mg | Freq: Once | INTRAVENOUS | Status: AC
  Administered 2020-05-10: 200 mg via INTRAVENOUS
  Filled 2020-05-10: qty 200

## 2020-05-10 NOTE — Patient Instructions (Signed)

## 2020-05-12 ENCOUNTER — Encounter: Payer: Self-pay | Admitting: Internal Medicine

## 2020-05-12 ENCOUNTER — Ambulatory Visit (INDEPENDENT_AMBULATORY_CARE_PROVIDER_SITE_OTHER): Payer: PRIVATE HEALTH INSURANCE | Admitting: Internal Medicine

## 2020-05-12 ENCOUNTER — Other Ambulatory Visit: Payer: Self-pay

## 2020-05-12 DIAGNOSIS — L089 Local infection of the skin and subcutaneous tissue, unspecified: Secondary | ICD-10-CM

## 2020-05-12 DIAGNOSIS — M869 Osteomyelitis, unspecified: Secondary | ICD-10-CM

## 2020-05-12 DIAGNOSIS — R197 Diarrhea, unspecified: Secondary | ICD-10-CM

## 2020-05-12 DIAGNOSIS — E11628 Type 2 diabetes mellitus with other skin complications: Secondary | ICD-10-CM | POA: Diagnosis not present

## 2020-05-12 DIAGNOSIS — M4625 Osteomyelitis of vertebra, thoracolumbar region: Secondary | ICD-10-CM

## 2020-05-12 DIAGNOSIS — L97421 Non-pressure chronic ulcer of left heel and midfoot limited to breakdown of skin: Secondary | ICD-10-CM | POA: Diagnosis not present

## 2020-05-12 MED ORDER — METRONIDAZOLE 500 MG PO TABS: 500.0000 mg | ORAL_TABLET | Freq: Three times a day (TID) | ORAL | 1 refills | Status: DC

## 2020-05-12 MED ORDER — CIPROFLOXACIN HCL 500 MG PO TABS: 500.0000 mg | ORAL_TABLET | Freq: Every day | ORAL | 5 refills | Status: DC

## 2020-05-12 NOTE — Assessment & Plan Note (Signed)
I am concerned that her worsening back pain could be due to persistent Pseudomonas infection.  She is willing to restart ciprofloxacin now and see how she tolerates it.  I will check her inflammatory markers today.

## 2020-05-12 NOTE — Progress Notes (Signed)
[facility] for Infectious Disease  Patient Active Problem List   Diagnosis Date Noted  . Diabetic infection of left foot ([place]) 05/12/2020    Priority: High  . S/P lumbar fusion 03/04/2020    Priority: High  . Osteomyelitis of vertebra of thoracolumbar region [facility]) 01/21/2020    Priority: High  . Diarrhea 09/04/2019    Priority: High  . Ulcer of left heel, limited to breakdown of skin ([place]) 02/01/2017    Priority: High  . IDA (iron deficiency anemia) 05/04/2020  . 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 Spinal stenosis of lumbar region 01/22/2020  . Back pain 11/17/2019  . Compression fracture of L1 lumbar vertebra ([place]) 11/17/2019  . Fracture of multiple ribs 11/17/2019  . Intractable nausea and vomiting 11/16/2019  . AKI (acute kidney injury) ([place]) 11/15/2019  . Nausea & vomiting 11/15/2019  . Pressure injury of skin 04/17/2019  . Septic arthritis of elbow, right ([place]) 04/17/2019  . 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 Sleep apnea 11/16/2017  . Diabetic foot infection ([place]) 04/06/2017  . Type 2 diabetes mellitus with hyperglycemia, with long-term current use of insulin ([place]) 04/06/2017  . Neuropathy 05/05/2015  . Diabetic peripheral neuropathy ([place]) 01/12/2015  . CKD (chronic kidney disease), stage III ([place]) 05/27/2014  . History of MI (myocardial infarction) 10/06/2013  . Nausea and vomiting 10/06/2013  . Obesity (BMI 30-39.9) 09/20/2013  . 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 Normocytic anemia 02/03/2013  . CAD (coronary artery disease) 01/11/2012  . Hepatic cirrhosis ([place]) 07/06/2010  . 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  . Hyperlipidemia 06/03/2008  . Essential hypertension 12/23/2006    Patient's Medications  New Prescriptions   No medications on file  Previous Medications   ACETAMINOPHEN (TYLENOL) 325 MG TABLET    Take 2 tablets (650 mg total) by mouth every 6 (six) hours as  needed for fever.   ATORVASTATIN (LIPITOR) 10 MG TABLET    Take 1 tablet by mouth once daily   BETHANECHOL (URECHOLINE) 10 MG TABLET    Take 1 tablet (10 mg total) by mouth 3 (three) times daily.   CONTINUOUS BLOOD GLUC RECEIVER (FREESTYLE LIBRE 14 DAY READER) DEVI    1 Device by Does not apply route daily.   CONTINUOUS BLOOD GLUC SENSOR (FREESTYLE LIBRE 14 DAY SENSOR) MISC    1 Device by Does not apply route daily.   DIAZEPAM (VALIUM) 5 MG TABLET    Take 1 tablet (5 mg total) by mouth every 12 (twelve) hours as needed for anxiety.   DULOXETINE (CYMBALTA) 60 MG CAPSULE    Take 1 capsule (60 mg total) by mouth daily.   FUROSEMIDE (LASIX) 10 MG/ML INJECTION    Inject 8 mLs (80 mg total) into the vein 2 (two) times daily.   HEPARIN 25000-0.45 UT/250ML-% INFUSION    Inject 1,200 Units/hr into the vein continuous.   HYDROCODONE-ACETAMINOPHEN (NORCO/VICODIN) 5-325 MG TABLET    Take 1 tablet by mouth every 6 (six) hours as needed for moderate pain.   INSULIN ASPART (NOVOLOG) 100 UNIT/ML INJECTION    Inject 0-5 Units into the skin at bedtime.   INSULIN ASPART (NOVOLOG) 100 UNIT/ML INJECTION    Inject 0-9 Units into the skin 3 (three) times daily with meals.   LIDOCAINE (LIDODERM) 5 %    Place 1 patch onto the skin daily. Remove & Discard patch within 12 hours or as directed by MD  LISINOPRIL (ZESTRIL) 10 MG TABLET    Take 10 mg by mouth daily.   MEROPENEM 1 G IN SODIUM CHLORIDE 0.9 % 100 ML    Inject 1 g into the vein daily.   MULTIPLE VITAMIN (MULTIVITAMIN WITH MINERALS) TABS TABLET    Take 1 tablet by mouth daily.   OXYCODONE (OXY IR/ROXICODONE) 5 MG IMMEDIATE RELEASE TABLET    Take 1 tablet (5 mg total) by mouth every 3 (three) hours as needed for moderate pain ((score 4 to 6)).   PANTOPRAZOLE (PROTONIX) 40 MG TABLET    Take 1 tablet (40 mg total) by mouth daily at 6 (six) AM.   PREGABALIN (LYRICA) 75 MG CAPSULE    Take 1 capsule (75 mg total) by mouth at bedtime.   RIVAROXABAN (XARELTO) 15 MG TABS  TABLET    Take 1 tablet (15 mg total) by mouth daily with supper.   TRAZODONE (DESYREL) 50 MG TABLET    Take 1 tablet (50 mg total) by mouth at bedtime.  Modified Medications   Modified Medication Previous Medication   CIPROFLOXACIN (CIPRO) 500 MG TABLET ciprofloxacin (CIPRO) 500 MG tablet      Take 1 tablet (500 mg total) by mouth daily.    Take 1 tablet (500 mg total) by mouth daily.   METRONIDAZOLE (FLAGYL) 500 MG TABLET metroNIDAZOLE (FLAGYL) 500 MG tablet      Take 1 tablet (500 mg total) by mouth 3 (three) times daily.    Take 1 tablet (500 mg total) by mouth 3 (three) times daily.  Discontinued Medications   No medications on file    Subjective: Yoselyn is in with her husband for her follow-up visit.  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  She received long courses of IV antibiotics throughout the last 6 months of last year.  When hospitalized in January she was switched to oral doxycycline for chronic suppression.  She missed her follow-up visit with me in February.  Unbeknownst to me she could not tolerate doxycycline and stopped it shortly after discharge in January. She has also been treated for polymicrobial left heel infection.  Previous cultures have grown Enterococcus, Proteus, Klebsiella and Pseudomonas.  She began having severe low back pain last month the pain has become progressively severe and she has not been able to get out of bed.  As a result she has missed many doctor appointments.  She came to the ED 01/20/2020.  MRI revealed:  IMPRESSION: Destruction of the L1 vertebral body with partial destruction of the inferior endplates of L89 and L2 with abnormal enhancing paraspinal and epidural soft tissues at that level. No definable abscess. I suspect this represents atypical infection such as TB or actinomycosis. B-cell lymphoma can also give this appearance. There is moderate compression of the thecal sac at L1 as described  above.  She underwent operative debridement and T11-L3 fusion by Dr. Duffy Rhody.  Operative cultures grew Pseudomonas.  She was started on IV cefepime.  Her hospital course was complicated by acute kidney injury requiring intermittent hemodialysis.  Her cefepime was changed to meropenem.  Her pain improved after her surgery.  She received a total of 29 days of IV antibiotic therapy before she decided to leave the hospital [place] on 02/26/2020.  She was not on any antibiotic therapy until her PCP, Dr. Rossie Muskrat, started her on ciprofloxacin on 03/02/2020.    She developed watery diarrhea and some nausea and vomiting.  As a result she stopped taking ciprofloxacin on 03/31/2020.  I ordered C. difficile testing when I last saw her on 04/05/2020 but her diarrhea stopped and no sample was ever brought in.  She called on 04/19/2020 and informed us that she had developed recurrent foul-smelling drainage from her left heel.  I asked her to restart ciprofloxacin and added metronidazole.  She has been taking the metronidazole but never restarted the ciprofloxacin because of concerns that her diarrhea will come back.  She is having persistent left heel drainage and says that her back pain has gotten worse over the past month.  She is now back to 10 out of 10 back pain.  Review of Systems: Review of Systems  Constitutional: Positive for diaphoresis and malaise/fatigue. Negative for chills, fever and weight loss.  Respiratory: Negative for cough, sputum production and shortness of breath.   Cardiovascular: Negative for chest pain.  Gastrointestinal: Negative for abdominal pain, diarrhea, nausea and vomiting.  Genitourinary: Negative for dysuria.  Musculoskeletal: Positive for back pain and joint pain.  Skin: Negative for rash.  Neurological: Negative for headaches.    Past Medical History:  Diagnosis Date  . Acute MI [facility]) 2007   presented to ED & had cardiac cath- but found to have normal  coronaries. Since that point in time her PCP cares f or cardiac needs. Dr. [name] - [facility]  . Anemia   . Anginal pain ([place])   . Anxiety   . Asthma   . Bulging lumbar disc   . Cataract   . 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 Cirrhosis of liver without mention of alcohol   . Constipation   . Dehiscence of closure of skin    left partial calcaneal excision  . Depression   . Diabetes mellitus    insulin dependent, adult onset  . Episode of visual loss of left eye   . Exertional shortness of breath   . Fatty liver   . Fibromyalgia   . GERD (gastroesophageal reflux disease)   . Hepatic steatosis   . High cholesterol   . Hypertension   . MRSA (methicillin resistant Staphylococcus aureus)   . Neuropathy    lower legs  . Osteoarthritis    hands, hips  . Proximal humerus fracture 10/15/12   Left  . PTSD (post-traumatic stress disorder)   . Renal insufficiency 05/05/2015    Social History   Tobacco Use  . Smoking status: Never Smoker  . Smokeless tobacco: Never Used  Vaping Use  . Vaping Use: Never used  Substance Use Topics  . Alcohol use: No    Alcohol/week: 0.0 standard drinks  . Drug use: No    Family History  Problem Relation Age of Onset  . Heart disease Father   . Diabetes Father   . Colitis Father   . Crohn's disease Father   . Cancer Father        leukemia  . Leukemia Father   . Diabetes Mellitus II Brother   . Kidney disease Brother   . Heart disease Brother   . Diabetes Mother   . Hypertension Mother   . Mental illness Mother   . Irritable bowel syndrome Daughter   . Diabetes Mellitus II Brother   . Kidney disease Brother   . Liver disease Brother   . Kidney disease Brother   . Heart attack Brother   . Diabetes Mellitus II Brother   .  Heart disease Brother   . Liver disease Brother   . Kidney disease Brother   . Kidney disease Brother   . Diabetes Mellitus II Brother   .  Diabetes Mellitus I Brother     Allergies  Allergen Reactions  . Bee Pollen Anaphylaxis  . Fish-Derived Products Hives, Shortness Of Breath, Swelling and Rash    Hives get in throat causing trouble breathing  . Mushroom Extract Complex Anaphylaxis  . Penicillins Anaphylaxis    **Tolerated cefepime March 2021 Did it involve swelling of the face/tongue/throat, SOB, or low BP? Yes Did it involve sudden or severe rash/hives, skin peeling, or any reaction on the inside of your mouth or nose? No Did you need to seek medical attention at a hospital or doctor's office? Yes When did it last happen?A few months ago If all above answers are "NO", may proceed with cephalosporin use.  [name] Kitchen Rosemary Oil Anaphylaxis  . Shellfish Allergy Hives, Shortness Of Breath, Swelling and Rash  . Tomato Hives and Shortness Of Breath    Hives in throat causes her trouble breathing  . Acetaminophen Other (See Comments)    GI upset  . Acyclovir And Related Other (See Comments)    Unknown rxn  . Aloe Vera Hives  . Broccoli [Brassica Oleracea] Hives  . Naproxen Other (See Comments)    Unknown rxn    Objective: Vitals:   05/12/20 1607  BP: 106/68  Pulse: 79  SpO2: 100%   There is no height or weight on file to calculate BMI. She is no longer in a wheelchair.  She is accompanied by her husband Her cardiac exam reveals a regular S1 and S2 and no murmurs Her lungs are clear All back incisions have healed nicely.  I do not detect any asymmetry of her lower back although she is tender to palpation just to the right of midline. The right elbow has healed with scarring.  There is no evidence of active infection She has developed a recurrent ulcer on the plantar surface of her left heel      Lab Results CMP     Component Value Date/Time   NA 140 04/21/2020 1450   K 4.6 04/21/2020 1450   CL 107 04/21/2020 1450   CO2 26 04/21/2020 1450   GLUCOSE 116 (H) 04/21/2020 1450   BUN 23 04/21/2020 1450    CREATININE 1.91 (H) 04/21/2020 1450   CREATININE 1.83 (H) 08/19/2019 1701   CALCIUM 9.4 04/21/2020 1450   PROT 8.2 (H) 04/21/2020 1450   ALBUMIN 3.4 (L) 04/21/2020 1450   AST 13 (L) 04/21/2020 1450   ALT 7 04/21/2020 1450   ALKPHOS 102 04/21/2020 1450   BILITOT 0.4 04/21/2020 1450   GFRNONAA 27 (L) 04/21/2020 1450   GFRAA 32 (L) 04/21/2020 1450   Sed Rate  Date Value  04/05/2020 CANCELED  03/04/2020 91 mm/h (H)  02/13/2020 124 mm/hr (H)   CRP  Date Value  04/05/2020 63.4 mg/L (H)  03/04/2020 40.8 mg/L (H)  02/13/2020 8.5 mg/dL (H)     Problem List Items Addressed This Visit      High   Ulcer of left heel, limited to breakdown of skin (HCC)   Relevant Medications   metroNIDAZOLE (FLAGYL) 500 MG tablet   Osteomyelitis of vertebra of thoracolumbar region ([place])    I am concerned that her worsening back pain could be due to persistent Pseudomonas infection.  She is willing to restart ciprofloxacin now and see how she tolerates it.  I will check her inflammatory markers today.      Relevant Medications   metroNIDAZOLE (FLAGYL) 500 MG tablet   Other Relevant Orders   Sedimentation rate   C-reactive protein   Diarrhea    She knows to call right away if her diarrhea recurs on ciprofloxacin.      Diabetic infection of left foot ([place])    She has recurrent left heel infection.  I will refer her back to the wound center.  We will need to consider repeat MRI.      Relevant Medications   metroNIDAZOLE (FLAGYL) 500 MG tablet   Other Relevant Orders   Ambulatory referral to [name]    Other Visit Diagnoses    Osteomyelitis of other site, unspecified type ([place])       Relevant Medications   ciprofloxacin (CIPRO) 500 MG tablet   metroNIDAZOLE (FLAGYL) 500 MG tablet       Michel Bickers, MD [facility] [place] [facility] [phone] pager   [phone] cell 05/12/2020, 5:04 PM

## 2020-05-12 NOTE — Assessment & Plan Note (Signed)
She knows to call right away if her diarrhea recurs on ciprofloxacin.

## 2020-05-12 NOTE — Assessment & Plan Note (Signed)
She has recurrent left heel infection.  I will refer her back to the wound center.  We will need to consider repeat MRI.

## 2020-05-13 ENCOUNTER — Inpatient Hospital Stay: Payer: PRIVATE HEALTH INSURANCE

## 2020-05-13 ENCOUNTER — Telehealth: Payer: Self-pay | Admitting: Family Medicine

## 2020-05-13 ENCOUNTER — Ambulatory Visit (INDEPENDENT_AMBULATORY_CARE_PROVIDER_SITE_OTHER): Payer: PRIVATE HEALTH INSURANCE | Admitting: Family Medicine

## 2020-05-13 ENCOUNTER — Other Ambulatory Visit: Payer: Self-pay | Admitting: Family Medicine

## 2020-05-13 ENCOUNTER — Ambulatory Visit (HOSPITAL_BASED_OUTPATIENT_CLINIC_OR_DEPARTMENT_OTHER)
Admission: RE | Admit: 2020-05-13 | Discharge: 2020-05-13 | Disposition: A | Discharge status: Home / Self Care | Payer: PRIVATE HEALTH INSURANCE | Source: Ambulatory Visit | Attending: Family Medicine | Admitting: Family Medicine

## 2020-05-13 VITALS — BP 130/68 | HR 71 | Temp 98.2°F | Resp 18

## 2020-05-13 DIAGNOSIS — Z981 Arthrodesis status: Secondary | ICD-10-CM | POA: Insufficient documentation

## 2020-05-13 DIAGNOSIS — G894 Chronic pain syndrome: Secondary | ICD-10-CM | POA: Insufficient documentation

## 2020-05-13 DIAGNOSIS — M4625 Osteomyelitis of vertebra, thoracolumbar region: Secondary | ICD-10-CM

## 2020-05-13 DIAGNOSIS — M25559 Pain in unspecified hip: Secondary | ICD-10-CM | POA: Diagnosis not present

## 2020-05-13 DIAGNOSIS — M8610 Other acute osteomyelitis, unspecified site: Secondary | ICD-10-CM

## 2020-05-13 MED ORDER — HYDROCODONE-ACETAMINOPHEN 5-325 MG PO TABS: 1.0000 | ORAL_TABLET | Freq: Four times a day (QID) | ORAL | 0 refills | Status: DC | PRN

## 2020-05-13 MED ORDER — PREGABALIN 75 MG PO CAPS: 75.0000 mg | ORAL_CAPSULE | Freq: Three times a day (TID) | ORAL | 1 refills | Status: DC

## 2020-05-13 NOTE — Telephone Encounter (Signed)
Please advise 

## 2020-05-13 NOTE — Patient Instructions (Addendum)
Hip Pain The hip is the joint between the upper legs and the lower pelvis. The bones, cartilage, tendons, and muscles of your hip joint support your body and allow you to move around. Hip pain can range from a minor ache to severe pain in one or both of your hips. The pain may be felt on the inside of the hip joint near the groin, or on the outside near the buttocks and upper thigh. You may also have swelling or stiffness in your hip area. Follow these instructions at home: Managing pain, stiffness, and swelling      If directed, put ice on the painful area. To do this: ? Put ice in a plastic bag. ? Place a towel between your skin and the bag. ? Leave the ice on for 20 minutes, 2-3 times a day.  If directed, apply heat to the affected area as often as told by your health care provider. Use the heat source that your health care provider recommends, such as a moist heat pack or a heating pad. ? Place a towel between your skin and the heat source. ? Leave the heat on for 20-30 minutes. ? Remove the heat if your skin turns bright red. This is especially important if you are unable to feel pain, heat, or cold. You may have a greater risk of getting burned. Activity  Do exercises as told by your health care provider.  Avoid activities that cause pain. General instructions   Take over-the-counter and prescription medicines only as told by your health care provider.  Keep a journal of your symptoms. Write down: ? How often you have hip pain. ? The location of your pain. ? What the pain feels like. ? What makes the pain worse.  Sleep with a pillow between your legs on your most comfortable side.  Keep all follow-up visits as told by your health care provider. This is important. Contact a health care provider if:  You cannot put weight on your leg.  Your pain or swelling continues or gets worse after one week.  It gets harder to walk.  You have a fever. Get help right away  if:  You fall.  You have a sudden increase in pain and swelling in your hip.  Your hip is red or swollen or very tender to touch. Summary  Hip pain can range from a minor ache to severe pain in one or both of your hips.  The pain may be felt on the inside of the hip joint near the groin, or on the outside near the buttocks and upper thigh.  Avoid activities that cause pain.  Write down how often you have hip pain, the location of the pain, what makes it worse, and what it feels like. This information is not intended to replace advice given to you by your health care provider. Make sure you discuss any questions you have with your health care provider. Document Revised: 03/03/2019 Document Reviewed: 03/03/2019 Elsevier Patient Education  [employer]. --

## 2020-05-13 NOTE — Progress Notes (Signed)
[name], adult    DOB: 1956/09/13  Age: 64 y.o. MRN: [id number]    Subjective:  Subjective   HPI [name] [name] presents for L hip pain   No new injury.   Pt is concerned about infection coming back.  Her surgeon asked her to come here first for xray and lab and then f/u with him.    Review of Systems  Constitutional: Negative for appetite change, diaphoresis, fatigue and unexpected weight change.  Eyes: Negative for pain, redness and visual disturbance.  Respiratory: Negative for cough, chest tightness, shortness of breath and wheezing.   Cardiovascular: Negative for chest pain, palpitations and leg swelling.  Endocrine: Negative for cold intolerance, heat intolerance, polydipsia, polyphagia and polyuria.  Genitourinary: Negative for difficulty urinating, dysuria and frequency.  Musculoskeletal: Positive for arthralgias and gait problem.  Neurological: Negative for dizziness, light-headedness, numbness and headaches.    History Past Medical History:  Diagnosis Date  . Acute MI [facility]) 2007   presented to ED & had cardiac cath- but found to have normal coronaries. Since that point in time her PCP cares f or cardiac needs. Dr. [name] - [facility]  . Anemia   . Anginal pain ([place])   . Anxiety   . Asthma   . Bulging lumbar disc   . Cataract   . 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 Cirrhosis of liver without mention of alcohol   . Constipation   . Dehiscence of closure of skin    left partial calcaneal excision  . Depression   . Diabetes mellitus    insulin dependent, adult onset  . Episode of visual loss of left eye   . Exertional shortness of breath   . Fatty liver   . Fibromyalgia   . GERD (gastroesophageal reflux disease)   . Hepatic steatosis   . High cholesterol   . Hypertension   . MRSA (methicillin resistant Staphylococcus aureus)   . Neuropathy    lower legs  . Osteoarthritis      hands, hips  . Proximal humerus fracture 10/15/12   Left  . PTSD (post-traumatic stress disorder)   . 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); Cardiac catheterization (2007); Skin graft split thickness leg / foot (Right, 03/19/2013); Cholecystectomy (1995); Abdominal hysterectomy (1979); Dilation and curettage of uterus (1977); I & D extremity (Right, 03/19/2013); I & D extremity (Left, 09/08/2016); I & D extremity (Left, 09/29/2016); Incision and drainage (Right, 04/17/2019); IR US Guide Vasc Access Right (04/21/2019); IR Fluoro Guide CV Line Right (04/21/2019); IR Fluoro Guide CV Line Right (08/28/2019); IR US Guide Vasc Access Right (08/28/2019); IR Fluoro Guide CV Line Right (11/04/2019); IR Removal Tun Cv Cath W/O FL (11/18/2019); Anterior lat lumbar fusion (N/A, 01/22/2020); Posterior lumbar fusion 4 level (N/A, 01/22/2020); Application of robotic assistance for spinal procedure (N/A, 01/22/2020); Esophagogastroduodenoscopy (egd) with propofol (N/A, 02/18/2020); biopsy (02/18/2020); IR US Guide Vasc Access Right (02/25/2020); IR Fluoro Guide CV Line Right (02/25/2020); and IR Removal Tun Cv Cath W/O FL (02/26/2020).   Her family history includes Cancer in her father; Colitis in her father; Crohn's disease in her father; Diabetes in her father and mother; Diabetes Mellitus I in her brother; Diabetes Mellitus II in her brother,  brother, brother, and brother; Heart attack in her brother; Heart disease in her brother, brother, and father; Hypertension in her mother; Irritable bowel syndrome in her daughter; Kidney disease in her brother, brother, brother, brother, and brother; Leukemia in her father; Liver disease in her brother and brother; Mental illness in her mother.She reports that she  has never smoked. She has never used smokeless tobacco. She reports that she does not drink alcohol and does not use drugs.  Current Outpatient Medications on File Prior to Visit  Medication Sig Dispense Refill  . acetaminophen (TYLENOL) 325 MG tablet Take 2 tablets (650 mg total) by mouth every 6 (six) hours as needed for fever.    [name] Kitchen atorvastatin (LIPITOR) 10 MG tablet Take 1 tablet by mouth once daily 90 tablet 1  . ciprofloxacin (CIPRO) 500 MG tablet Take 1 tablet (500 mg total) by mouth daily. 30 tablet 5  . Continuous Blood Gluc Receiver (FREESTYLE LIBRE 14 DAY READER) DEVI 1 Device by Does not apply route daily. 2 each 6  . Continuous Blood Gluc Sensor (FREESTYLE LIBRE 14 DAY SENSOR) MISC 1 Device by Does not apply route daily. 2 each 6  . diazepam (VALIUM) 5 MG tablet Take 1 tablet (5 mg total) by mouth every 12 (twelve) hours as needed for anxiety. 30 tablet 0  . DULoxetine (CYMBALTA) 60 MG capsule Take 1 capsule (60 mg total) by mouth daily. 90 capsule 3  . lidocaine (LIDODERM) 5 % Place 1 patch onto the skin daily. Remove & Discard patch within 12 hours or as directed by MD 30 patch 0  . lisinopril (ZESTRIL) 10 MG tablet Take 10 mg by mouth daily.    . metroNIDAZOLE (FLAGYL) 500 MG tablet Take 1 tablet (500 mg total) by mouth 3 (three) times daily. 90 tablet 1  . Multiple Vitamin (MULTIVITAMIN WITH MINERALS) TABS tablet Take 1 tablet by mouth daily.    [name] Kitchen oxyCODONE (OXY IR/ROXICODONE) 5 MG immediate release tablet Take 1 tablet (5 mg total) by mouth every 3 (three) hours as needed for moderate pain ((score 4 to 6)). 30 tablet 0  . pantoprazole (PROTONIX) 40 MG tablet Take 1 tablet (40 mg total) by mouth daily at 6 (six) AM.    . Rivaroxaban (XARELTO) 15 MG TABS tablet Take 1 tablet (15 mg total) by mouth daily with supper. 30 tablet 1  . bethanechol (URECHOLINE) 10 MG tablet Take 1 tablet (10 mg total) by mouth 3 (three) times daily. (Patient not taking: Reported on 04/05/2020)    .  [DISCONTINUED] metFORMIN (GLUCOPHAGE) 1000 MG tablet Take 1,000 mg by mouth 2 (two) times daily with a meal.      . [DISCONTINUED] omeprazole (PRILOSEC) 20 MG capsule Take 20 mg by mouth daily.       No current facility-administered medications on file prior to visit.     Objective:  Objective  Physical Exam Vitals and nursing note reviewed.  Constitutional:      Appearance: She is well-developed.  HENT:     Head: Normocephalic and atraumatic.  Eyes:     Conjunctiva/sclera: Conjunctivae normal.  Neck:     Thyroid: No thyromegaly.     Vascular: No carotid bruit or JVD.  Cardiovascular:     Rate and Rhythm: Normal rate and regular rhythm.     Heart sounds: Normal heart sounds. No murmur heard.   Pulmonary:     Effort: Pulmonary effort is normal. No respiratory distress.     Breath sounds: Normal breath sounds.  No wheezing or rales.  Chest:     Chest wall: No tenderness.  Musculoskeletal:        General: Tenderness present. No swelling.     Cervical back: Normal range of motion and neck supple.  Neurological:     Mental Status: She is alert and oriented to person, place, and time.     Motor: Weakness present.     Gait: Gait abnormal.     Comments: Pt is in power scooter  Unable to put weight on leg    BP 130/68 (BP Location: Left Arm, Patient Position: Sitting, Cuff Size: Normal)   Pulse 71   Temp 98.2 F (36.8 C) (Oral)   Resp 18   SpO2 97%  Wt Readings from Last 3 Encounters:  02/26/20 187 lb 13.3 oz (85.2 kg)  02/18/20 190 lb 9.6 oz (86.5 kg)  01/20/20 180 lb 0.8 oz (81.7 kg)     Lab Results  Component Value Date   WBC 6.8 05/13/2020   HGB 8.8 (L) 05/13/2020   HCT 27.8 (L) 05/13/2020   PLT 216.0 05/13/2020   GLUCOSE 116 (H) 04/21/2020   CHOL 88 02/13/2020   TRIG 112 02/13/2020   HDL 21 (L) 02/13/2020   LDLDIRECT 115.8 01/23/2012   LDLCALC 45 02/13/2020   ALT 7 04/21/2020   AST 13 (L) 04/21/2020   NA 140 04/21/2020   K 4.6 04/21/2020   CL 107  04/21/2020   CREATININE 1.91 (H) 04/21/2020   BUN 23 04/21/2020   CO2 26 04/21/2020   TSH 1.65 01/03/2016   INR 1.2 01/22/2020   HGBA1C 6.6 (H) 02/12/2020   MICROALBUR 26.3 (H) 11/26/2018    US Venous Img Lower Unilateral Left  Result Date: 03/18/2020 CLINICAL DATA:  Questionable DVT on prior study.  Follow-up exam. EXAM: LEFT LOWER EXTREMITY VENOUS DOPPLER ULTRASOUND TECHNIQUE: Gray-scale sonography with compression, as well as color and duplex ultrasound, were performed to evaluate the deep venous system(s) from the level of the common femoral vein through the popliteal and proximal calf veins. COMPARISON:  Mar 08, 2020 FINDINGS: VENOUS Normal compressibility of the common femoral, superficial femoral, and popliteal veins, as well as the visualized calf veins. Visualized portions of profunda femoral vein and great saphenous vein unremarkable. No filling defects to suggest DVT on grayscale or color Doppler imaging. Doppler waveforms show normal direction of venous flow, normal respiratory plasticity and response to augmentation. Limited views of the contralateral common femoral vein are unremarkable. OTHER None. Limitations: none IMPRESSION: Negative. The questionable left soleal vein thrombus was not appreciated on today's exam. Electronically Signed   By: Constance Holster M.D.   On: 03/18/2020 18:53     Assessment & Plan:  Plan  I have discontinued Solomia R. Christiano's HYDROcodone-acetaminophen. I have also changed her pregabalin. Additionally, I am having her maintain her atorvastatin, lidocaine, acetaminophen, bethanechol, multivitamin with minerals, oxyCODONE, pantoprazole, FreeStyle Libre 14 Day Reader, FreeStyle Libre 14 Day Sensor, Rivaroxaban, diazepam, DULoxetine, lisinopril, ciprofloxacin, and metroNIDAZOLE.  Meds ordered this encounter  Medications  . DISCONTD: HYDROcodone-acetaminophen (NORCO/VICODIN) 5-325 MG tablet    Sig: Take 1 tablet by mouth every 6 (six) hours as needed for  moderate pain.    Dispense:  30 tablet    Refill:  0  . pregabalin (LYRICA) 75 MG capsule    Sig: Take 1 capsule (75 mg total) by mouth 3 (three) times daily.    Dispense:  270 capsule    Refill:  1    Problem List  Items Addressed This Visit      Unprioritized   Hip pain - Primary    Check xray and labs F/u surgeon      Relevant Orders   CBC with Differential/Platelet (Completed)   Osteomyelitis of vertebra of thoracolumbar region [facility])    Pt concerned about re occurrence of infection Check labs and xray --- r/u surgeon       Other Visit Diagnoses    Other acute osteomyelitis, unspecified site [facility])       Relevant Orders   DG Lumbar Spine Complete (Completed)   S/P spinal fusion       Relevant Orders   DG Lumbar Spine Complete (Completed)   Chronic pain syndrome       Relevant Medications   pregabalin (LYRICA) 75 MG capsule   Other Relevant Orders   DG Lumbar Spine Complete (Completed)      Follow-up: Return in about 3 months (around 08/13/2020), or if symptoms worsen or fail to improve, for hyperlipidemia, fasting.  Ann Held, DO

## 2020-05-13 NOTE — Telephone Encounter (Signed)
Patient states she is supposed to get 120 pills not 30 of Hydrocodone-acetaminophen.

## 2020-05-13 NOTE — Telephone Encounter (Signed)
Did she leave it there---  ?  I changed the rx.

## 2020-05-14 LAB — CBC WITH DIFFERENTIAL/PLATELET
Basophils Absolute: 0.1 10*3/uL (ref 0.0–0.1)
Basophils Relative: 0.9 % (ref 0.0–3.0)
Eosinophils Absolute: 0.1 10*3/uL (ref 0.0–0.7)
Eosinophils Relative: 1.7 % (ref 0.0–5.0)
HCT: 27.8 % — ABNORMAL LOW (ref 36.0–46.0)
Hemoglobin: 8.8 g/dL — ABNORMAL LOW (ref 12.0–15.0)
Lymphocytes Relative: 16.8 % (ref 12.0–46.0)
Lymphs Abs: 1.1 10*3/uL (ref 0.7–4.0)
MCHC: 31.7 g/dL (ref 30.0–36.0)
MCV: 83 fl (ref 78.0–100.0)
Monocytes Absolute: 0.7 10*3/uL (ref 0.1–1.0)
Monocytes Relative: 9.6 % (ref 3.0–12.0)
Neutro Abs: 4.8 10*3/uL (ref 1.4–7.7)
Neutrophils Relative %: 71 % (ref 43.0–77.0)
Platelets: 216 10*3/uL (ref 150.0–400.0)
RBC: 3.35 Mil/uL — ABNORMAL LOW (ref 3.87–5.11)
RDW: 18.2 % — ABNORMAL HIGH (ref 11.5–15.5)
WBC: 6.8 10*3/uL (ref 4.0–10.5)

## 2020-05-14 NOTE — Telephone Encounter (Signed)
New Rx sent to pharmacy

## 2020-05-15 LAB — C-REACTIVE PROTEIN: CRP: 21.4 mg/L — ABNORMAL HIGH (ref <8.0)

## 2020-05-15 LAB — SEDIMENTATION RATE: Sed Rate: 119 mm/h — ABNORMAL HIGH (ref 0–30)

## 2020-05-16 ENCOUNTER — Encounter: Payer: Self-pay | Admitting: Family Medicine

## 2020-05-16 DIAGNOSIS — M25559 Pain in unspecified hip: Secondary | ICD-10-CM | POA: Insufficient documentation

## 2020-05-16 NOTE — Assessment & Plan Note (Signed)
Check xray and labs F/u surgeon

## 2020-05-16 NOTE — Assessment & Plan Note (Signed)
Pt concerned about re occurrence of infection Check labs and xray --- r/u surgeon

## 2020-05-17 ENCOUNTER — Other Ambulatory Visit: Payer: Self-pay

## 2020-05-17 ENCOUNTER — Inpatient Hospital Stay: Payer: PRIVATE HEALTH INSURANCE

## 2020-05-17 VITALS — BP 175/78 | HR 84 | Temp 97.5°F | Resp 17

## 2020-05-17 DIAGNOSIS — D509 Iron deficiency anemia, unspecified: Secondary | ICD-10-CM | POA: Diagnosis not present

## 2020-05-17 DIAGNOSIS — D508 Other iron deficiency anemias: Secondary | ICD-10-CM

## 2020-05-17 MED ORDER — SODIUM CHLORIDE 0.9 % IV SOLN
Freq: Once | INTRAVENOUS | Status: AC
  Filled 2020-05-17: qty 250

## 2020-05-17 MED ORDER — SODIUM CHLORIDE 0.9 % IV SOLN
200.0000 mg | Freq: Once | INTRAVENOUS | Status: AC
  Administered 2020-05-17: 200 mg via INTRAVENOUS
  Filled 2020-05-17: qty 200

## 2020-05-17 NOTE — Patient Instructions (Signed)

## 2020-05-20 ENCOUNTER — Inpatient Hospital Stay: Payer: PRIVATE HEALTH INSURANCE

## 2020-05-20 ENCOUNTER — Other Ambulatory Visit: Payer: Self-pay

## 2020-05-20 VITALS — BP 130/67 | HR 76 | Temp 99.6°F | Resp 18

## 2020-05-20 DIAGNOSIS — D509 Iron deficiency anemia, unspecified: Secondary | ICD-10-CM | POA: Diagnosis not present

## 2020-05-20 DIAGNOSIS — D508 Other iron deficiency anemias: Secondary | ICD-10-CM

## 2020-05-20 MED ORDER — SODIUM CHLORIDE 0.9 % IV SOLN
Freq: Once | INTRAVENOUS | Status: AC
  Filled 2020-05-20: qty 250

## 2020-05-20 MED ORDER — SODIUM CHLORIDE 0.9 % IV SOLN
200.0000 mg | Freq: Once | INTRAVENOUS | Status: AC
  Administered 2020-05-20: 200 mg via INTRAVENOUS
  Filled 2020-05-20: qty 10

## 2020-05-20 NOTE — Patient Instructions (Signed)

## 2020-05-27 ENCOUNTER — Encounter: Admit: 2020-05-27 | Discharge: 2020-05-27 | Payer: MEDICARE

## 2020-05-28 MED ORDER — TRAZODONE 50 MG PO TAB
ORAL_TABLET | Freq: Every evening | 3 refills | Status: AC | PRN
Start: 2020-05-28 — End: ?

## 2020-05-31 ENCOUNTER — Encounter: Admit: 2020-05-31 | Discharge: 2020-05-31 | Payer: MEDICARE

## 2020-06-01 MED ORDER — BETAMETHASONE VALERATE 0.1 % TP CREA
Freq: Every day | 1 refills | 7.00000 days | Status: AC
Start: 2020-06-01 — End: ?

## 2020-06-09 ENCOUNTER — Encounter (HOSPITAL_BASED_OUTPATIENT_CLINIC_OR_DEPARTMENT_OTHER): Payer: PRIVATE HEALTH INSURANCE | Attending: Physician Assistant | Admitting: Physician Assistant

## 2020-06-09 ENCOUNTER — Encounter: Admit: 2020-06-09 | Discharge: 2020-06-09 | Payer: MEDICARE

## 2020-06-09 DIAGNOSIS — E1142 Type 2 diabetes mellitus with diabetic polyneuropathy: Secondary | ICD-10-CM | POA: Insufficient documentation

## 2020-06-09 DIAGNOSIS — L97429 Non-pressure chronic ulcer of left heel and midfoot with unspecified severity: Secondary | ICD-10-CM | POA: Diagnosis present

## 2020-06-09 DIAGNOSIS — L97522 Non-pressure chronic ulcer of other part of left foot with fat layer exposed: Secondary | ICD-10-CM | POA: Diagnosis not present

## 2020-06-09 DIAGNOSIS — E11621 Type 2 diabetes mellitus with foot ulcer: Secondary | ICD-10-CM | POA: Diagnosis not present

## 2020-06-09 DIAGNOSIS — E1122 Type 2 diabetes mellitus with diabetic chronic kidney disease: Secondary | ICD-10-CM | POA: Insufficient documentation

## 2020-06-09 DIAGNOSIS — E11319 Type 2 diabetes mellitus with unspecified diabetic retinopathy without macular edema: Secondary | ICD-10-CM | POA: Insufficient documentation

## 2020-06-09 DIAGNOSIS — N183 Chronic kidney disease, stage 3 unspecified: Secondary | ICD-10-CM | POA: Insufficient documentation

## 2020-06-10 MED ORDER — METFORMIN 1,000 MG PO TAB
ORAL_TABLET | Freq: Two times a day (BID) | 1 refills | Status: AC
Start: 2020-06-10 — End: ?

## 2020-06-11 NOTE — Progress Notes (Signed)
LYNDSIE, WALLMAN ([id number]) Visit Report for 06/09/2020 Chief Complaint Document Details Patient Name: Date of Service: [name] [name] RE, [name] 06/09/2020 1:15 PM Medical Record Number: [id number] Patient Account Number: [id number] Date of Birth/Sex: Treating RN: [birth year] (64 y.o. [name] Primary Care Provider: LO WNE CHA SE, YV O NNE Other Clinician: Referring Provider: Treating Provider/Extender: Worthy Keeler LO WNE CHA SE, YV O NNE Weeks in Treatment: 0 Information Obtained from: Patient Chief Complaint Left heel ulcer Electronic Signature(s) Signed: 06/09/2020 2:18:43 PM By: Worthy Keeler PA-C Entered By: Worthy Keeler on 06/09/2020 14:18:43 -------------------------------------------------------------------------------- Debridement Details Patient Name: Date of Service: [name], [name] 06/09/2020 1:15 PM Medical Record Number: [id number] Patient Account Number: [id number] Date of Birth/Sex: Treating RN: [birth year] (64 y.o. F) Baruch Gouty Primary Care Provider: LO WNE CHA SE, YV O NNE Other Clinician: Referring Provider: Treating Provider/Extender: Worthy Keeler LO WNE CHA SE, YV O NNE Weeks in Treatment: 0 Debridement Performed for Assessment: Wound #7 Left Calcaneus Performed By: Physician Worthy Keeler, PA Debridement Type: Debridement Severity of Tissue Pre Debridement: Fat layer exposed Level of Consciousness (Pre-procedure): Awake and Alert Pre-procedure Verification/Time Out Yes - 14:20 Taken: Start Time: 14:22 Pain Control: Lidocaine 4% T opical Solution T Area Debrided (L x W): otal 3.5 (cm) x 3 (cm) = 10.5 (cm) Tissue and other material debrided: Viable, Non-Viable, Callus, Slough, Subcutaneous, Skin: Epidermis, Slough Level: Skin/Subcutaneous Tissue Debridement Description: Excisional Instrument: Curette Bleeding: Minimum Hemostasis Achieved: Pressure End Time: 14:33 Procedural Pain: 0 Post Procedural Pain: 0 Response to  Treatment: Procedure was tolerated well Level of Consciousness (Post- Awake and Alert procedure): Post Debridement Measurements of Total Wound Length: (cm) 2.1 Width: (cm) 2.5 Depth: (cm) 0.2 Volume: (cm) 0.825 Character of Wound/Ulcer Post Debridement: Improved Severity of Tissue Post Debridement: Fat layer exposed Post Procedure Diagnosis Same as Pre-procedure Electronic Signature(s) Signed: 06/09/2020 6:14:05 PM By: Baruch Gouty RN, BSN Signed: 06/10/2020 10:06:45 AM By: Worthy Keeler PA-C Entered By: Baruch Gouty on 06/09/2020 14:34:09 -------------------------------------------------------------------------------- HPI Details Patient Name: Date of Service: [name], [name] 06/09/2020 1:15 PM Medical Record Number: [id number] Patient Account Number: [id number] Date of Birth/Sex: Treating RN: 08-30-1956 (64 y.o. [name] Primary Care Provider: LO WNE CHA SE, YV O NNE Other Clinician: Referring Provider: Treating Provider/Extender: Worthy Keeler LO WNE CHA SE, YV O NNE Weeks in Treatment: 0 History of Present Illness HPI Description: ADMISSION 11/12/2018 This is a 64 year old woman with type 2 diabetes and diabetic neuropathy. She has been dealing with a left heel plantar wound for roughly 2 years. She states this started when she pulled some skin off the area and it progressed into a wound. She also has an area on the tip of her left great toe for 1 year. She has been largely followed by Dr. Sharol Given and she had an excision of bone in the left heel in 2017. I do not see microbiology from this excision or pathology. Apparently this wound never really closed. She most recently has been using Silvadene cream. Offloading this with a scooter. The last MRI I see was in June 2018 which did not show osteomyelitis at that time. Apparently this wound is never really progressed towards healing. During her last review by Dr. Sharol Given in October it was recommended that she undergo  a left BKA and she refused. She went to see a second orthopedic consult at [facility] who am recommended conservative wound  care to see if this will close or progress towards closure but also warned that possible surgery may be necessary. An x-ray that was done at [facility] showed an irregular calcaneal body and calcaneal tuberosity. This is probably postprocedural. Previous x-rays at [facility] had suggested heterotrophic calcifications but I do not see this. Apparently a second ointment was added to the Silvadene which the patient thinks is because some improvement. She has not been systemically unwell. No fever or chills. She thinks the second ointment that was given to her at [facility] has helped somewhat. 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 ABIs done in June 2018 showed a ABI of 1.12 on the right and 1.1 on the left she was biphasic bilaterally. ABIs in our clinic were noncompressible today. 11/20/18 on evaluation today patient is seen for her second visit here in the office although this is actually the first visit with me concerning an issue that she's having with her great toe and heel of the left foot. Fortunately she does not appear to be have any discomfort at this time and she does have a scooter in order to offload her foot she also has a boot for offloading. With that being said this is something that appears to have been going on for some time she was seeing Dr. due to his recommendation was that she was going to require a below knee amputation. With that being said she wanted to come to the wound center but according to the patient Dr. Sharol Given told her that "we could not help her". Nonetheless  she saw another provider who suggested that it may be worth a shot for [name] to try and help her out if it all possible. Nonetheless she decided that it would be worth a trial since otherwise any the way she's gonna end up with an amputation. Obviously if we can heal the wound then that will not be the case. Again I explained to her that obviously there are no guarantees but we will give this a good try and attempts to get the wound to heal. 1/30; the patient continues to have areas on the left plantar heel and the left plantar great toe. The more worrisome area is the heel. She went for MRI on Saturday but this could not be done because she had silver alginate in the wound bed. I would like to get this rebooked. If she does not have osteomyelitis she will need a total contact cast. We have been using silver alginate in the wounds 2/7; patient has wounds on her left plantar heel and left plantar great toe. The area on the heel has some depth although it looks about the same today. Her MRI will finally be done tomorrow. If she has osteomyelitis in the heel and then we will need to consider her for IV antibiotics and hyperbaric oxygen. If the MRI is negative she will need a total contact cast although she is wearing a cam boot and using a scooter at present. Will be using silver alginate. She will take it off tomorrow in preparation for the MRI 2/14; the patient's MRI showed extensive surgical changes with a large portion of her calcaneus removed on the left secondary to  her previous surgery by Dr. Sharol Given however there is no evidence of osteomyelitis. She would therefore be a candidate for a total contact cast and we applied this for the first time today 2/21; silver collagen total contact cast. The area on the plantar left great toe is "healed" still a lot of callus on this area. Dimensions on the plantar heel not too much different some epithelialization is present however. This is an improvement 12/25/18  on evaluation today patient actually is seen for follow-up concerning issues that she has been having with her cast she states she feels like it's wet and squishy in the bottom of her cast. The reason she does come in to have this evaluated and see what is going on. Fortunately there's no signs of infection at this time was the cast was removed. 3/6; the patient's area on the tip of her right great toe is callused but there is no open area here. She still has the fairly extensive area in the left heel. We have been using silver collagen in the wound. 01/08/19 on evaluation today patient actually appears to be doing very well in regard to her heel ulcer in my pinion to see if you shown signs of improvement which is excellent news. There's no evidence of infection. She continues to have quite a bit of drainage but fortunately again this doesn't seem to be hindering her healing. 3/20 -Patient's foot ulcer on the left appears to be doing well, the dimensions appear encouraging, we have been using total contact cast with Prisma and will continue doing that 3/27; patient continues to make nice progress on the left plantar heel. She is using silver collagen under a total contact cast. It is been a while since I have seen this wound and it really looks a lot better. Smaller with healthy granulation 4/3; she continues to make nice progress on the left plantar heel. Using silver collagen under a total contact cast 4/10; left plantar heel. Again skin over the surface of the wound with not much in the way of adherence. This results in undermining. Using silver collagen changed to silver alginate 4/17 left plantar heel. Again not much improvement. There is no undermining today no debridement was required we used silver alginate last time because of excessive moisture 4/24 left plantar heel. Again not as much improvement as I would have liked. About 3 mm of depth. Thick callused skin around the circumference.  Using silver alginate 5/1; left plantar heel this is improved this week. Less depth epithelialization is present. Using silver alginate alginate under a total contact cast 5/8; left plantar heel dimensions are about the same. The depth appears to be improved. I use silver collagen starting today under the cast 5/15 left plantar heel. Arrives today with a 2-day history of feeling like something had "slipped" while walking her dog in the cast. Unfortunately extensive area relatively to the small wound of undermining superiorly denuded epithelium. 5/22-Patient returns at 1 week after being taken off the TCC on account of some fluid collection that was debrided and cultured at last visit from the plantar ulcer on the left heel. The culture results are polymicrobial with Klebsiella, enterococcus, Proteus growth these organisms are sensitive to Cipro except with enterococcus which is sensitive to ampicillin but patient is highly allergic to penicillin according to her. 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 5/29; the patient arrived last week  with a new wound on her left plantar great toe. With regards to the culture that I did 2 weeks ago of her deteriorating heel wound this grew Klebsiella and enterococcus. She was given Cipro however the enterococcus would not be covered well by a quinolone. She is allergic to penicillin. I will give her linezolid 600 twice daily x5 days 6/12; the patient continues to have a wound on her left and after she returns to the beach there is undermining laterally. She has the new wound from this 2 weeks or so ago on the plantar tip of her left great toe. She had a fall today scraping the dorsal surface of the left fifth 7/14; READMISSION since the patient was last here she was hospitalized from 04/15/2019 through 04/22/2019. She had presented to an outside ER after falling and hitting her  elbow. She had had previous surgery on the elbow and fractures several years ago. An x-ray was negative and she was sent home. She is readmitted with sepsis and acute renal failure secondary to septic arthritis of the elbow. Cultures apparently showed pansensitive staph aureus however she has a severe beta-lactam allergy. She was treated with vancomycin. Apparently the vancomycin is completed and her PICC line is removed although she is seeing Dr. Megan Salon tomorrow due to continued pain and swelling. In the hospital she had an IandD by Dr. [name] of orthopedics. The original surgery was on 04/17/2019 and it was felt that she had septic arthritis. She is still having a lot of pain and swelling in the right elbow and apparently is due to see Dr. Megan Salon tomorrow and what she thinks is that she will be restarted on antibiotics. With regards to her heel wounds/left foot wounds. Her arterial studies were checked and her ABIs were within normal limits. X-ray showed plantar foot ulcer negative for osteomyelitis postop resection of the posterior calcaneus. She has been using silver alginate to the heel 8/4-Patient returns after being seen on 7/14, we are using silver alginate to the calcaneal wound, she is continuing to receive care for her right elbow septic arthritis 8/14- Patient returns after 1 week, she is being seen by the surgical group for her right elbow, she is here for the left calcaneal wound for which we are using silver alginate this is about the same 8/21; this is a patient I readmitted to the clinic 5 weeks ago. She is continuing to have difficulties with a septic arthritis of the right elbow she had after a fall and apparently has had surgical IandD's since the last time we saw her. She is also following with Dr. Megan Salon of infectious disease and is apparently on 3 oral antibiotics although at the time of this dictation I am not sure what they are. She comes in today with a necrotic  surface on the left great toe with a blister laterally. This was still clearly an open wound. She had purulent drainage coming out of this. The original wound on the plantar aspect of her right heel in the setting of a Charcot foot is deep not open to bone but certainly not any better at all. We have been using silver alginate 8/28; dealing with septic arthritis of the right elbow. May need to go for further surgery here in the second week of September. She had a blister on the left great toe that was purulent [place] draining last week although a culture did not grow anything [already on antibiotics through infectious disease for her elbow]. The punched-out  area on her heel is just like it was when she first came in. This almost closed with a total contact cast there are no options for that now. The patient states she cannot stay off her foot having to do housework X-ray of the foot showed a moderate bunion and severe degenerative changes at the first metatarsal phalangeal joint there was a moderate plantar calcaneal spur. We will need to check the location of this. 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 It is oral. We are using silver collagen to the punched-out area on her left heel and to the summer superficial area of the left great toe. She is offloading this is best she can although judging by the amount of callus it is not enough. She thinks she has enough strength in her right elbow now to use her scooter. 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 Hale Bogus notes and infectious disease. We are using silver collagen to the punched-out areas on her left heel and to the plantar  aspect of her left great toe she is now using her scooter to get around and to help offload these areas 10/2; 2-week follow-up. Still on clindamycin I believe for the left elbow she is going for an MRI of the elbow over the weekend. She is then going to see orthopedics and infectious disease. The area on the left heel is certainly no better. This does not probe to bone however there is green drainage. She has an area on the tip of her toe. The area on the heel is in a wound we almost closed at one point with total contact casting. 10/9; one-week follow-up. Culture I did of the heel last week which was a swab culture admittedly showed moderate Enterococcus faecalis moderate Pseudomonas. The wound itself looks about the same. There is no palpable bone although the depth of it closely approximates bone. The heel is swollen but not erythematous. Her MRI is booked for next week. She also has an MRI of the right elbow. I've also lifted the last consult from Dr. Megan Salon who is following her for septic arthritis of the right elbow. I did not see him specifically comment on her left heel however the patient states that he is aware of this. I did not specifically address the organisms I cultured last week because of the possible effect of any additional cultures that will be done on the elbow. Additionally these were superficial not bone cultures 10/16; her MRI of the heel was put back to next week. She did have her elbow done. I have been in contact with Dr. Megan Salon about my concerns about osteomyelitis of the left heel. We have been using collagen. She also has a wound on the plantar tip of her first toe 10/23; her MRI of the heel was negative for osteomyelitis but suggested a small abscess. She also had an MRI of her elbow that showed findings compatible with a septic joint. She went to see Dr. Megan Salon and she is going to have both oral and IV antibiotics which I am sure will cover any infection in the  heel. I did not actually  see his note today. We are using silver collagen offloading the heel with a heel offloading boot 11/6; patient continues with silver collagen to both wound areas on her plantar left heel and plantar left great toe. She remains on daptomycin by Dr. Megan Salon of infectious disease. She complains of diarrhea. Dr. Megan Salon is aware of this. 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 she has been put on oral vancomycin I think a lot of high clinical suspicion for pseudomembranous colitis Because of the right elbow there are no options to completely off her left foot beyond the heel offloading boot we have now. Fortunately the left heel ulcer itself has remained static some improvement in the plantar left great toe 11/20; patient arrives for review of her left heel ulcer. I also note she has a difficult time with septic arthritis of the right elbow. She currently is on IV daptomycin which seems to have helped the drainage in her elbow [MSSA]. Because of high concern for pseudomembranous colitis she was also put on vancomycin orally and Flagyl orally. She was on Levaquin but that was discontinued because of the concern about C. difficile. She states her diarrhea is better. She arrives in clinic today with a large area of denuded skin on the lateral part of the heel. This had totally separated. We have been using silver alginate to the wound areas. She arrived in a scooter telling me she is offloading the foot is much as possible. 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 Dr. Megan Salon stopped the daptomycin and now has her on vancomycin 1000 mg IV daily. This  is supposed to go onto 1/18. She is being referred to [facility] for what sounds like a elbow replacement type operation for her MSSA septic arthritis. She arrives in clinic today with a blister on the medial part of the wound on her heel. She still has a lot of callus around the heel although the surface of the wound on the left heel looks somewhat better. READMISSION 03/19/2020 [name] [name] is a 64 year old woman we have followed for almost all of 2020 with a neuropathic wound on her left heel and the tip of her left great toe.. We she had previously had a partial calcanectomy by Dr. Sharol Given because of underlying osteomyelitis. She also had a history of MRSA even when we admitted her to the clinic. An MRI when she first came into our clinic did not show osteomyelitis. We put her in a total contact cast and gradually this contracted to the point it was almost closed however in that same timeframe she had a fall. 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 We had to stop putting her in a cast partially because of infection and partially because she could not support herself with the elbow. Things went progressively downhill. When we last saw her at the end of December she had a large open wound on the left heel. She had had recurrent infections in the right elbow. It was felt that either the heel lift: Ice her elbow or vice versa. We never had MRSA I do not believe cultured in the left heel however. 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 She underwent an operative debridement and a T11-L3 fusion. An operative culture grew Pseudomonas. She was treated with cefepime and required readmission to the hospital with acute kidney failure requiring temporary dialysis. She was discharged to rehab I believe she signed herself out [place]. He has been following with her primary doctor and she comes in the clinic  today with miraculously the left heel totally closed. She is followed up with Dr. Megan Salon of infectious disease he does not feel she has any evidence of infection in her elbow or her heel for that matter and the heel is actually fully epithelialized. She is on ciprofloxacin 500 twice daily I think mostly directed at Pseudomonas. The patient is convinced that it was the treatment of the Pseudomonas in her back that ultimately led to closure of her heel and that certainly possible or could have been because she was nonambulatory in the hospital for 3 months according to her. She is still using her heel offloading boot and she says she is "learning to walk again" Readmission: 06/09/2020 patient presents today for reevaluation here in the clinic following a reopening of her heel ulcer. She tells [name] that this actually occurred about 2 weeks after she was last seen in May of this year though it is now the beginning of August and she has not come back until now to see [name]. Nonetheless she does have an open wound there is been no x-rays at this time infectious disease has been monitoring him following this up until this point. They made the recommendation that she come to see [name]. Fortunately there is no signs of active infection systemically though she does tell me that she had Pseudomonas in the spine when she had her back surgery she is currently on antibiotics for that. Electronic Signature(s) Signed: 06/09/2020 3:24:50 PM By: Worthy Keeler PA-C Entered By: Worthy Keeler on 06/09/2020 15:24:49 -------------------------------------------------------------------------------- Physical Exam Details Patient Name: Date of Service: [name], [name] 06/09/2020 1:15 PM Medical Record Number: [id number] Patient Account Number: [id number] Date of Birth/Sex: Treating RN: Jul 02, 1956 (64 y.o. [name] Primary Care Provider: LO WNE CHA SE, YV O NNE Other Clinician: Referring Provider: Treating  Provider/Extender: Worthy Keeler LO WNE CHA SE, YV O NNE Weeks in Treatment: 0 Constitutional patient is hypertensive.. pulse regular and within target range for patient.[name] Kitchen respirations regular, non-labored and within target range for patient.[name] Kitchen temperature within target range for patient.. Well-nourished and well-hydrated in no acute distress. Eyes conjunctiva clear no eyelid edema noted. pupils equal round and reactive to light and accommodation. Ears, Nose, Mouth, and Throat no gross abnormality of ear auricles or external auditory canals. normal hearing noted during conversation. mucus membranes moist. Respiratory normal breathing without difficulty. Cardiovascular no clubbing, cyanosis, significant edema, <3 sec cap refill. Musculoskeletal Patient unable to walk without assistance. no significant deformity or arthritic changes, no loss or range of motion, no clubbing. Psychiatric this patient is able to make decisions and demonstrates good insight into disease process. Alert and Oriented x 3. pleasant and cooperative. Notes Upon inspection patient's wound bed actually showed signs of significant callus buildup the patient actually tells me she is not really ambulating that often and therefore really does not think she should have much in the way of callus. Nonetheless she definitely does and I am not exactly sure what is causing that in that regard. Fortunately there is no  evidence of active infection at this time locally which is good news. I did perform sharp debridement to clear away the necrotic tissue and debris from the surface of the wound she tolerated all that today without complication and post debridement the wound bed appears to be doing much better which is great news. Again she tells me that she is ambulating very little at this point. Electronic Signature(s) Signed: 06/09/2020 3:25:33 PM By: Worthy Keeler PA-C Entered By: Worthy Keeler on 06/09/2020  15:25:32 -------------------------------------------------------------------------------- Physician Orders Details Patient Name: Date of Service: [name], [name] 06/09/2020 1:15 PM Medical Record Number: [id number] Patient Account Number: [id number] Date of Birth/Sex: Treating RN: 12-25-1955 (64 y.o. F) [name], [name] Primary Care Provider: LO WNE CHA SE, YV O NNE Other Clinician: Referring Provider: Treating Provider/Extender: Worthy Keeler LO WNE CHA SE, YV O NNE Weeks in Treatment: 0 Verbal / Phone Orders: No Diagnosis Coding ICD-10 Coding Code Description E11.621 Type 2 diabetes mellitus with foot ulcer L97.522 Non-pressure chronic ulcer of other part of left foot with fat layer exposed E11.42 Type 2 diabetes mellitus with diabetic polyneuropathy Follow-up Appointments Return Appointment in 1 week. Dressing Change Frequency Wound #7 Left Calcaneus Change Dressing every other day. Skin Barriers/Peri-Wound Care Moisturizing lotion - to foot with dressing changes Wound Cleansing Wound #7 Left Calcaneus May shower and wash wound with soap and water. Primary Wound Dressing Wound #7 Left Calcaneus Hydrofera Blue - ready Secondary Dressing Wound #7 Left Calcaneus Kerlix/Rolled Gauze Dry Gauze Off-Loading Other: - minimal weight bearing left foot Consults Podiatry - for diabetic toe nail care Electronic Signature(s) Signed: 06/09/2020 6:14:05 PM By: Baruch Gouty RN, BSN Signed: 06/10/2020 10:06:45 AM By: Worthy Keeler PA-C Entered By: Baruch Gouty on 06/09/2020 14:38:32 Prescription 06/09/2020 -------------------------------------------------------------------------------- [name] R. Worthy Keeler [place] Patient Name: Provider: 12/25/1955 [id number] Date of Birth: NPI#: [id number] Sex: DEA #: [phone] Phone #: License #: [place] [facility] Patient Address: [facility] [address] [place]  [address] [place], [place] [address] [place], [place] [address] [phone] Allergies mushroom; penicillin; rosemary; Shellfish Containing Products; bee venom protein (honey bee); Fish Containing Products; tomato, raw; aloe vera; acetaminophen; acyclovir; naproxen Provider's Orders Podiatry - for diabetic toe nail care Hand Signature: Date(s): Electronic Signature(s) Signed: 06/09/2020 6:14:05 PM By: Baruch Gouty RN, BSN Signed: 06/10/2020 10:06:45 AM By: Worthy Keeler PA-C Entered By: Baruch Gouty on 06/09/2020 14:38:33 -------------------------------------------------------------------------------- Problem List Details Patient Name: Date of Service: [name], [name] 06/09/2020 1:15 PM Medical Record Number: [id number] Patient Account Number: [id number] Date of Birth/Sex: Treating RN: [birth year] (64 y.o. [name] Primary Care Provider: LO WNE CHA SE, YV O NNE Other Clinician: Referring Provider: Treating Provider/Extender: Worthy Keeler LO WNE CHA SE, YV O NNE Weeks in Treatment: 0 Active Problems ICD-10 Encounter Code Description Active Date MDM Diagnosis E11.621 Type 2 diabetes mellitus with foot ulcer 06/09/2020 No Yes L97.522 Non-pressure chronic ulcer of other part of left foot with fat layer exposed 06/09/2020 No Yes E11.42 Type 2 diabetes mellitus with diabetic polyneuropathy 06/09/2020 No Yes Inactive Problems Resolved Problems Electronic Signature(s) Signed: 06/09/2020 2:18:27 PM By: Worthy Keeler PA-C Entered By: Worthy Keeler on 06/09/2020 14:18:26 -------------------------------------------------------------------------------- Progress Note Details Patient Name: Date of Service: [name], [name] 06/09/2020 1:15 PM Medical Record Number: [id number] Patient Account Number: [id number] Date of Birth/Sex: Treating RN: 1956-02-05 (64 y.o.  F) Baruch Gouty Primary Care Provider: LO WNE CHA SE, YV O NNE Other Clinician: Referring  Provider: Treating Provider/Extender: Worthy Keeler LO WNE CHA SE, YV O NNE Weeks in Treatment: 0 Subjective Chief Complaint Information obtained from Patient Left heel ulcer History of Present Illness (HPI) ADMISSION 11/12/2018 This is a 64 year old woman with type 2 diabetes and diabetic neuropathy. She has been dealing with a left heel plantar wound for roughly 2 years. She states this started when she pulled some skin off the area and it progressed into a wound. She also has an area on the tip of her left great toe for 1 year. She has been largely followed by Dr. Sharol Given and she had an excision of bone in the left heel in 2017. I do not see microbiology from this excision or pathology. Apparently this wound never really closed. She most recently has been using Silvadene cream. Offloading this with a scooter. The last MRI I see was in June 2018 which did not show osteomyelitis at that time. Apparently this wound is never really progressed towards healing. During her last review by Dr. Sharol Given in October it was recommended that she undergo a left BKA and she refused. She went to see a second orthopedic consult at [facility] who am recommended conservative wound care to see if this will close or progress towards closure but also warned that possible surgery may be necessary. An x-ray that was done at [facility] showed an irregular calcaneal body and calcaneal tuberosity. This is probably postprocedural. Previous x-rays at [facility] had suggested heterotrophic calcifications but I do not see this. Apparently a second ointment was added to the Silvadene which the patient thinks is because some improvement. She has not been systemically unwell. No fever or chills. She thinks the second ointment that was given to her at [facility] has helped somewhat. 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 ABIs done in June 2018 showed a ABI of 1.12 on the right and 1.1 on the left she was biphasic bilaterally. ABIs in our clinic were noncompressible today. 11/20/18 on evaluation today patient is seen for her second visit here in the office although this is actually the first visit with me concerning an issue that she's having with her great toe and heel of the left foot. Fortunately she does not appear to be have any discomfort at this time and she does have a scooter in order to offload her foot she also has a boot for offloading. With that being said this is something that appears to have been going on for some time she was seeing Dr. due to his recommendation was that she was going to require a below knee amputation. With that being said she wanted to come to the wound center but according to the patient Dr. Sharol Given told her that "we could not help her". Nonetheless she saw another provider who suggested that it may be worth a shot for [name] to try and help her out if it all possible. Nonetheless she decided that it would be worth a trial since otherwise any the way she's gonna end up with an amputation. Obviously if we can heal the wound then that will  not be the case. Again I explained to her that obviously there are no guarantees but we will give this a good try and attempts to get the wound to heal. 1/30; the patient continues to have areas on the left plantar heel and the left plantar great toe. The more worrisome area is the heel. She went for MRI on Saturday but this could not be done because she had silver alginate in the wound bed. I would like to get this rebooked. If she does not have osteomyelitis she will need a total contact cast. We have been using silver alginate in the wounds 2/7; patient has wounds  on her left plantar heel and left plantar great toe. The area on the heel has some depth although it looks about the same today. Her MRI will finally be done tomorrow. If she has osteomyelitis in the heel and then we will need to consider her for IV antibiotics and hyperbaric oxygen. If the MRI is negative she will need a total contact cast although she is wearing a cam boot and using a scooter at present. Will be using silver alginate. She will take it off tomorrow in preparation for the MRI 2/14; the patient's MRI showed extensive surgical changes with a large portion of her calcaneus removed on the left secondary to her previous surgery by Dr. Sharol Given however there is no evidence of osteomyelitis. She would therefore be a candidate for a total contact cast and we applied this for the first time today 2/21; silver collagen total contact cast. The area on the plantar left great toe is "healed" still a lot of callus on this area. Dimensions on the plantar heel not too much different some epithelialization is present however. This is an improvement 12/25/18 on evaluation today patient actually is seen for follow-up concerning issues that she has been having with her cast she states she feels like it's wet and squishy in the bottom of her cast. The reason she does come in to have this evaluated and see what is going on. Fortunately there's no signs of infection at this time was the cast was removed. 3/6; the patient's area on the tip of her right great toe is callused but there is no open area here. She still has the fairly extensive area in the left heel. We have been using silver collagen in the wound. 01/08/19 on evaluation today patient actually appears to be doing very well in regard to her heel ulcer in my pinion to see if you shown signs of improvement which is excellent news. There's no evidence of infection. She continues to have quite a bit of drainage but fortunately again this doesn't seem to be  hindering her healing. 3/20 -Patient's foot ulcer on the left appears to be doing well, the dimensions appear encouraging, we have been using total contact cast with Prisma and will continue doing that 3/27; patient continues to make nice progress on the left plantar heel. She is using silver collagen under a total contact cast. It is been a while since I have seen this wound and it really looks a lot better. Smaller with healthy granulation 4/3; she continues to make nice progress on the left plantar heel. Using silver collagen under a total contact cast 4/10; left plantar heel. Again skin over the surface of the wound with not much in the way of adherence. This results in undermining. Using silver collagen changed to silver alginate 4/17 left plantar heel. Again not much improvement. There  is no undermining today no debridement was required we used silver alginate last time because of excessive moisture 4/24 left plantar heel. Again not as much improvement as I would have liked. About 3 mm of depth. Thick callused skin around the circumference. Using silver alginate 5/1; left plantar heel this is improved this week. Less depth epithelialization is present. Using silver alginate alginate under a total contact cast 5/8; left plantar heel dimensions are about the same. The depth appears to be improved. I use silver collagen starting today under the cast 5/15 left plantar heel. Arrives today with a 2-day history of feeling like something had "slipped" while walking her dog in the cast. Unfortunately extensive area relatively to the small wound of undermining superiorly denuded epithelium. 5/22-Patient returns at 1 week after being taken off the TCC on account of some fluid collection that was debrided and cultured at last visit from the plantar ulcer on the left heel. The culture results are polymicrobial with Klebsiella, enterococcus, Proteus growth these organisms are sensitive to Cipro except  with enterococcus which is sensitive to ampicillin but patient is highly allergic to penicillin according to her. 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 5/29; the patient arrived last week with a new wound on her left plantar great toe. With regards to the culture that I did 2 weeks ago of her deteriorating heel wound this grew Klebsiella and enterococcus. She was given Cipro however the enterococcus would not be covered well by a quinolone. She is allergic to penicillin. I will give her linezolid 600 twice daily x5 days 6/12; the patient continues to have a wound on her left and after she returns to the beach there is undermining laterally. She has the new wound from this 2 weeks or so ago on the plantar tip of her left great toe. She had a fall today scraping the dorsal surface of the left fifth 7/14; READMISSION since the patient was last here she was hospitalized from 04/15/2019 through 04/22/2019. She had presented to an outside ER after falling and hitting her elbow. She had had previous surgery on the elbow and fractures several years ago. An x-ray was negative and she was sent home. She is readmitted with sepsis and acute renal failure secondary to septic arthritis of the elbow. Cultures apparently showed pansensitive staph aureus however she has a severe beta-lactam allergy. She was treated with vancomycin. Apparently the vancomycin is completed and her PICC line is removed although she is seeing Dr. Megan Salon tomorrow due to continued pain and swelling. In the hospital she had an IandD by Dr. [name] of orthopedics. The original surgery was on 04/17/2019 and it was felt that she had septic arthritis. She is still having a lot of pain and swelling in the right elbow and apparently is due to see Dr. Megan Salon tomorrow and what she thinks is that she will be restarted on antibiotics. With regards to her heel  wounds/left foot wounds. Her arterial studies were checked and her ABIs were within normal limits. X-ray showed plantar foot ulcer negative for osteomyelitis postop resection of the posterior calcaneus. She has been using silver alginate to the heel 8/4-Patient returns after being seen on 7/14, we are using silver alginate to the calcaneal wound, she is continuing to receive care for her right elbow septic arthritis 8/14- Patient returns after 1 week, she is being seen by the surgical group for  her right elbow, she is here for the left calcaneal wound for which we are using silver alginate this is about the same 8/21; this is a patient I readmitted to the clinic 5 weeks ago. She is continuing to have difficulties with a septic arthritis of the right elbow she had after a fall and apparently has had surgical IandD's since the last time we saw her. She is also following with Dr. Megan Salon of infectious disease and is apparently on 3 oral antibiotics although at the time of this dictation I am not sure what they are. She comes in today with a necrotic surface on the left great toe with a blister laterally. This was still clearly an open wound. She had purulent drainage coming out of this. The original wound on the plantar aspect of her right heel in the setting of a Charcot foot is deep not open to bone but certainly not any better at all. We have been using silver alginate 8/28; dealing with septic arthritis of the right elbow. May need to go for further surgery here in the second week of September. She had a blister on the left great toe that was purulent [place] draining last week although a culture did not grow anything [already on antibiotics through infectious disease for her elbow]. The punched-out area on her heel is just like it was when she first came in. This almost closed with a total contact cast there are no options for that now. The patient states she cannot stay off her foot having to do  housework X-ray of the foot showed a moderate bunion and severe degenerative changes at the first metatarsal phalangeal joint there was a moderate plantar calcaneal spur. We will need to check the location of this. 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 It is oral. We are using silver collagen to the punched-out area on her left heel and to the summer superficial area of the left great toe. She is offloading this is best she can although judging by the amount of callus it is not enough. She thinks she has enough strength in her right elbow now to use her scooter. 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 Hale Bogus notes and infectious disease. We are using silver collagen to the punched-out areas on her left heel and to the plantar aspect of her left great toe she is now using her scooter to get around and to help offload these areas 10/2; 2-week follow-up. Still on clindamycin I believe for the left elbow she is going for an MRI of the elbow over the weekend. She is then going to see orthopedics and infectious disease. The area on the left heel is certainly no better. This does not probe to bone however there is green drainage. She has an area on the tip of her toe. The area on the heel is in a wound we almost closed at one point with total contact casting. 10/9; one-week follow-up. Culture I did of the heel last week which was a swab culture admittedly showed moderate Enterococcus faecalis moderate Pseudomonas. The wound itself looks about the same. There is no  palpable bone although the depth of it closely approximates bone. The heel is swollen but not erythematous. Her MRI is  booked for next week. She also has an MRI of the right elbow. I've also lifted the last consult from Dr. Megan Salon who is following her for septic arthritis of the right elbow. I did not see him specifically comment on her left heel however the patient states that he is aware of this. I did not specifically address the organisms I cultured last week because of the possible effect of any additional cultures that will be done on the elbow. Additionally these were superficial not bone cultures 10/16; her MRI of the heel was put back to next week. She did have her elbow done. I have been in contact with Dr. Megan Salon about my concerns about osteomyelitis of the left heel. We have been using collagen. She also has a wound on the plantar tip of her first toe 10/23; her MRI of the heel was negative for osteomyelitis but suggested a small abscess. She also had an MRI of her elbow that showed findings compatible with a septic joint. She went to see Dr. Megan Salon and she is going to have both oral and IV antibiotics which I am sure will cover any infection in the heel. I did not actually see his note today. We are using silver collagen offloading the heel with a heel offloading boot 11/6; patient continues with silver collagen to both wound areas on her plantar left heel and plantar left great toe. She remains on daptomycin by Dr. Megan Salon of infectious disease. She complains of diarrhea. Dr. Megan Salon is aware of this. 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 she has been put on oral vancomycin I think a lot of high clinical suspicion for pseudomembranous colitis Because of the right elbow there are no options to completely off her left foot beyond the heel offloading boot we have now. Fortunately the left heel ulcer itself has remained static some improvement in the plantar  left great toe 11/20; patient arrives for review of her left heel ulcer. I also note she has a difficult time with septic arthritis of the right elbow. She currently is on IV daptomycin which seems to have helped the drainage in her elbow [MSSA]. Because of high concern for pseudomembranous colitis she was also put on vancomycin orally and Flagyl orally. She was on Levaquin but that was discontinued because of the concern about C. difficile. She states her diarrhea is better. She arrives in clinic today with a large area of denuded skin on the lateral part of the heel. This had totally separated. We have been using silver alginate to the wound areas. She arrived in a scooter telling me she is offloading the foot is much as possible. 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 Dr. Megan Salon stopped the daptomycin and now has her on vancomycin 1000 mg IV daily. This is supposed to go onto 1/18. She is being referred to [place] for what sounds like a elbow replacement type operation for her MSSA septic arthritis. She arrives in clinic today with a blister on the medial part of the wound on her heel. She still  has a lot of callus around the heel although the surface of the wound on the left heel looks somewhat better. READMISSION 03/19/2020 [name] [name] is a 64 year old woman we have followed for almost all of 2020 with a neuropathic wound on her left heel and the tip of her left great toe.. We she had previously had a partial calcanectomy by Dr. Sharol Given because of underlying osteomyelitis. She also had a history of MRSA even when we admitted her to the clinic. An MRI when she first came into our clinic did not show osteomyelitis. We put her in a total contact cast and gradually this contracted to the point it was  almost closed however in that same timeframe she had a fall. 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 We had to stop putting her in a cast partially because of infection and partially because she could not support herself with the elbow. Things went progressively downhill. When we last saw her at the end of December she had a large open wound on the left heel. She had had recurrent infections in the right elbow. It was felt that either the heel lift: Ice her elbow or vice versa. We never had MRSA I do not believe cultured in the left heel however. 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 She underwent an operative debridement and a T11-L3 fusion. An operative culture grew Pseudomonas. She was treated with cefepime and required readmission to the hospital with acute kidney failure requiring temporary dialysis. She was discharged to rehab I believe she signed herself out [place]. He has been following with her primary doctor and she comes in the clinic today with miraculously the left heel totally closed. She is followed up with Dr. Megan Salon of infectious disease he does not feel she has any evidence of infection in her elbow or her heel for that matter and the heel is actually fully epithelialized. She is on ciprofloxacin 500 twice daily I think mostly directed at Pseudomonas. The patient is convinced that it was the treatment of the Pseudomonas in her back that ultimately led to closure of her heel and that certainly possible or could have been because she was nonambulatory in the hospital for 3 months according to her. She is still using her heel offloading boot and she says she is "learning to walk again" Readmission: 06/09/2020 patient presents today for reevaluation here in the clinic following a reopening of her heel ulcer. She tells [name] that this actually occurred about 2 weeks after she was last  seen in May of this year though it is now the beginning of August and she has not come back until now to see [name]. Nonetheless she does have an open wound there is been no x-rays at this time infectious disease has been monitoring him following this up until this point. They made the recommendation that she come to see [name]. Fortunately there is no signs of active infection systemically though she does tell me that she had Pseudomonas in the spine when she had her back surgery she is currently on antibiotics for that. Patient History Information obtained from Patient. Allergies Fish Containing Products (Reaction: hives, swelling), mushroom (Severity: Severe, Reaction: anaphylaxis), penicillin (Severity: Severe, Reaction: throat swelling), tomato, raw (Reaction: hives), rosemary (Severity: Severe, Reaction: anaphylaxis), Shellfish Containing Products (Severity: Severe, Reaction: hives, swelling), aloe vera (Reaction: rash), bee venom protein (honey bee) (Severity: Moderate, Reaction: hives,  swelling), acetaminophen (Reaction: GI upset), acyclovir (Reaction: unknown), naproxen (Reaction: unknown) Family History Diabetes - Maternal Grandparents,Paternal Grandparents,Father,Siblings, Heart Disease - Mother,Father,Siblings, Hypertension - Father, No family history of Cancer, Hereditary Spherocytosis, Kidney Disease, Lung Disease, Seizures, Stroke, Thyroid Problems, Tuberculosis. Social History Never smoker, Marital Status - Married, Alcohol Use - Never, Drug Use - No History, Caffeine Use - Moderate - tea. Medical History Eyes Patient has history of Cataracts - left eye removed, mild right eye Denies history of Glaucoma, Optic Neuritis Ear/Nose/Mouth/Throat Denies history of Chronic sinus problems/congestion, Middle ear problems Hematologic/Lymphatic Patient has history of Anemia Denies history of Hemophilia, Human Immunodeficiency Virus, Lymphedema, Sickle Cell Disease Respiratory Patient has  history of Sleep Apnea Denies history of Aspiration, Asthma, Chronic Obstructive Pulmonary Disease (COPD), Pneumothorax, Tuberculosis Cardiovascular Patient has history of Coronary Artery Disease, Hypertension, Myocardial Infarction - 2014 Gastrointestinal Denies history of Cirrhosis , Colitis, Crohnoos, Hepatitis A, Hepatitis B, Hepatitis C Endocrine Patient has history of Type II Diabetes Denies history of Type I Diabetes Immunological Denies history of Lupus Erythematosus, Raynaudoos, Scleroderma Integumentary (Skin) Denies history of History of Burn Musculoskeletal Patient has history of Osteoarthritis, Osteomyelitis - left calcaneus and spine Denies history of Gout, Rheumatoid Arthritis Neurologic Patient has history of Neuropathy Denies history of Dementia, Quadriplegia, Paraplegia, Seizure Disorder Oncologic Denies history of Received Chemotherapy, Received Radiation Psychiatric Denies history of Anorexia/bulimia, Confinement Anxiety Hospitalization/Surgery History - left heel surgery. - hysterectomy. - cholecystectomy. - right elbow surgery x4. Medical A Surgical History Notes nd Constitutional Symptoms (General Health) morbid obesity, fibromyalgia Eyes macular degeneration left eye Ear/Nose/Mouth/Throat seasonal allergies Cardiovascular hyperlipidemia Gastrointestinal fatty liver Genitourinary CKD stage 3 Neurologic cervical ruptured disc Review of Systems (ROS) Constitutional Symptoms (General Health) Denies complaints or symptoms of Fatigue, Fever, Chills, Marked Weight Change. Ear/Nose/Mouth/Throat Denies complaints or symptoms of Chronic sinus problems or rhinitis. Gastrointestinal Denies complaints or symptoms of Frequent diarrhea, Nausea, Vomiting. Integumentary (Skin) Complains or has symptoms of Wounds - left heel wound. Psychiatric Denies complaints or symptoms of Claustrophobia, Suicidal. Objective Constitutional patient is hypertensive..  pulse regular and within target range for patient.[name] Kitchen respirations regular, non-labored and within target range for patient.[name] Kitchen temperature within target range for patient.. Well-nourished and well-hydrated in no acute distress. Vitals Time Taken: 12:59 PM, Height: 61 in, Source: Stated, Weight: 170 lbs, Source: Stated, BMI: 32.1, Temperature: 97.9 F, Pulse: 86 bpm, Respiratory Rate: 18 breaths/min, Blood Pressure: 162/93 mmHg. Eyes conjunctiva clear no eyelid edema noted. pupils equal round and reactive to light and accommodation. Ears, Nose, Mouth, and Throat no gross abnormality of ear auricles or external auditory canals. normal hearing noted during conversation. mucus membranes moist. Respiratory normal breathing without difficulty. Cardiovascular no clubbing, cyanosis, significant edema, Musculoskeletal Patient unable to walk without assistance. no significant deformity or arthritic changes, no loss or range of motion, no clubbing. Psychiatric this patient is able to make decisions and demonstrates good insight into disease process. Alert and Oriented x 3. pleasant and cooperative. General Notes: Upon inspection patient's wound bed actually showed signs of significant callus buildup the patient actually tells me she is not really ambulating that often and therefore really does not think she should have much in the way of callus. Nonetheless she definitely does and I am not exactly sure what is causing that in that regard. Fortunately there is no evidence of active infection at this time locally which is good news. I did perform sharp debridement to clear away the necrotic tissue and debris from the surface of the wound she  tolerated all that today without complication and post debridement the wound bed appears to be doing much better which is great news. Again she tells me that she is ambulating very little at this point. Integumentary (Hair, Skin) Wound #7 status is Open. Original cause of  wound was Gradually Appeared. The wound is located on the Left Calcaneus. The wound measures 1.7cm length x 0.9cm width x 0.6cm depth; 1.202cm^2 area and 0.721cm^3 volume. There is Fat Layer (Subcutaneous Tissue) Exposed exposed. There is no tunneling noted, however, there is undermining starting at 6:00 and ending at 10:00 with a maximum distance of 0.7cm. There is a medium amount of serosanguineous drainage noted. The wound margin is well defined and not attached to the wound base. There is large (67-100%) pink granulation within the wound bed. There is a small (1- 33%) amount of necrotic tissue within the wound bed including Adherent Slough. 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 There was a Excisional Skin/Subcutaneous Tissue Debridement with a total area of 10.5 sq cm performed by Worthy Keeler, PA. With the following instrument(s): Curette to remove Viable and Non-Viable tissue/material. Material removed includes Callus, Subcutaneous Tissue, Slough, and Skin: Epidermis after achieving pain control using Lidocaine 4% Topical Solution. No specimens were taken. A time out was conducted at 14:20, prior to the start of the procedure. A Minimum amount of bleeding was controlled with Pressure. The procedure was tolerated well with a pain level of 0 throughout and a pain level of 0 following the procedure. Post Debridement Measurements: 2.1cm length x 2.5cm width x 0.2cm depth; 0.825cm^3 volume. Character of Wound/Ulcer Post Debridement is improved. Severity of Tissue Post Debridement is: Fat layer exposed. Post procedure Diagnosis Wound #7:  Same as Pre-Procedure Plan Follow-up Appointments: Return Appointment in 1 week. Dressing Change Frequency: Wound #7 Left Calcaneus: Change Dressing every other day. Skin Barriers/Peri-Wound Care: Moisturizing lotion - to foot with dressing changes Wound Cleansing: Wound #7 Left Calcaneus: May shower and wash wound with soap and water. Primary Wound Dressing: Wound #7 Left Calcaneus: Hydrofera Blue - ready Secondary Dressing: Wound #7 Left Calcaneus: Kerlix/Rolled Gauze Dry Gauze Off-Loading: Other: - minimal weight bearing left foot Consults ordered were: Podiatry - for diabetic toe nail care 1. I would recommend currently that she needs to try to keep pressure off of the heel is much as possible I would be careful even activity as that skin to be a big issue as well for the heel and keeping this more open. I do not see any signs of active infection and I do not believe an x-ray is necessarily warranted yet though things do not get better than I would recommend an x-ray as well as potential MRI depend on the x-ray findings. 2. We can initiate treatment with a Hydrofera Blue dressing secured with roll gauze. 3. I would also recommend that she see podiatry for nail care as she has a toenail that is actually digging into the toe at the site of it I think that is of utmost importance. We will see patient back for reevaluation in 1 week here in the clinic. If anything worsens or changes patient will contact our office for additional  recommendations. Electronic Signature(s) Signed: 06/09/2020 3:26:14 PM By: Worthy Keeler PA-C Entered By: Worthy Keeler on 06/09/2020 15:26:13 -------------------------------------------------------------------------------- HxROS Details Patient Name: Date of Service: [name], [name] 06/09/2020 1:15 PM Medical Record Number: [id number] Patient Account Number: [id number] Date of Birth/Sex: Treating RN: Oct 17, 1956 (64 y.o. [name] Primary  Care Provider: LO WNE CHA SE, YV O NNE Other Clinician: Referring Provider: Treating Provider/Extender: Worthy Keeler LO WNE CHA SE, YV O NNE Weeks in Treatment: 0 Information Obtained From Patient Constitutional Symptoms (General Health) Complaints and Symptoms: Negative for: Fatigue; Fever; Chills; Marked Weight Change Medical History: Past Medical History Notes: morbid obesity, fibromyalgia Ear/Nose/Mouth/Throat Complaints and Symptoms: Negative for: Chronic sinus problems or rhinitis Medical History: Negative for: Chronic sinus problems/congestion; Middle ear problems Past Medical History Notes: seasonal allergies Gastrointestinal Complaints and Symptoms: Negative for: Frequent diarrhea; Nausea; Vomiting Medical History: Negative for: Cirrhosis ; Colitis; Crohns; Hepatitis A; Hepatitis B; Hepatitis C Past Medical History Notes: fatty liver Integumentary (Skin) Complaints and Symptoms: Positive for: Wounds - left heel wound Medical History: Negative for: History of Burn Psychiatric Complaints and Symptoms: Negative for: Claustrophobia; Suicidal Medical History: Negative for: Anorexia/bulimia; Confinement Anxiety Eyes Medical History: Positive for: Cataracts - left eye removed, mild right eye Negative for: Glaucoma; Optic Neuritis Past Medical History Notes: macular degeneration left eye Hematologic/Lymphatic Medical History: Positive for: Anemia Negative for: Hemophilia; Human Immunodeficiency Virus; Lymphedema; Sickle Cell Disease Respiratory Medical History: Positive for: Sleep Apnea Negative for: Aspiration; Asthma; Chronic Obstructive Pulmonary Disease (COPD); Pneumothorax; Tuberculosis Cardiovascular Medical History: Positive for: Coronary Artery Disease; Hypertension; Myocardial Infarction - 2014 Past Medical History Notes: hyperlipidemia Endocrine Medical History: Positive for: Type II Diabetes Negative for: Type I Diabetes Time with diabetes: 9  yrs Treated with: Insulin Blood sugar tested every day: Yes T ested : 3 times per day Blood sugar testing results: Breakfast: 100; Lunch: 120; Dinner: 120-135 Genitourinary Medical History: Past Medical History Notes: CKD stage 3 Immunological Medical History: Negative for: Lupus Erythematosus; Raynauds; Scleroderma Musculoskeletal Medical History: Positive for: Osteoarthritis; Osteomyelitis - left calcaneus and spine Negative for: Gout; Rheumatoid Arthritis Neurologic Medical History: Positive for: Neuropathy Negative for: Dementia; Quadriplegia; Paraplegia; Seizure Disorder Past Medical History Notes: cervical ruptured disc Oncologic Medical History: Negative for: Received Chemotherapy; Received Radiation HBO Extended History Items Eyes: Cataracts Immunizations Pneumococcal Vaccine: Received Pneumococcal Vaccination: Yes Implantable Devices None Hospitalization / Surgery History Type of Hospitalization/Surgery left heel surgery hysterectomy cholecystectomy right elbow surgery x4 Family and Social History Cancer: No; Diabetes: Yes - Maternal Grandparents,Paternal Grandparents,Father,Siblings; Heart Disease: Yes - Mother,Father,Siblings; Hereditary Spherocytosis: No; Hypertension: Yes - Father; Kidney Disease: No; Lung Disease: No; Seizures: No; Stroke: No; Thyroid Problems: No; Tuberculosis: No; Never smoker; Marital Status - Married; Alcohol Use: Never; Drug Use: No History; Caffeine Use: Moderate - tea; Financial Concerns: No; Food, Clothing or Shelter Needs: No; Support System Lacking: No; Transportation Concerns: No Electronic Signature(s) Signed: 06/10/2020 10:06:45 AM By: Worthy Keeler PA-C Signed: 06/11/2020 5:19:54 PM By: [name] RN, BSN Entered By: [name] on 06/09/2020 13:14:53 -------------------------------------------------------------------------------- SuperBill Details Patient Name: Date of Service: [name], [name]  06/09/2020 Medical Record Number: [id number] Patient Account Number: [id number] Date of Birth/Sex: Treating RN: [birth year] (64 y.o. [name] Primary Care Provider: LO WNE CHA SE, YV O NNE Other Clinician: Referring Provider: Treating Provider/Extender: Worthy Keeler LO WNE CHA SE, YV O NNE Weeks in Treatment: 0 Diagnosis Coding ICD-10 Codes Code Description E11.621 Type 2  diabetes mellitus with foot ulcer L97.522 Non-pressure chronic ulcer of other part of left foot with fat layer exposed E11.42 Type 2 diabetes mellitus with diabetic polyneuropathy Facility Procedures CPT4 Code: [id number] Description: [place] VISIT-LEV 3 EST PT Modifier: 25 Quantity: 1 CPT4 Code: [id number] Description: 03704 - DEB SUBQ TISSUE 20 SQ CM/< ICD-10 Diagnosis Description L97.522 Non-pressure chronic ulcer of other part of left foot with fat layer exposed Modifier: Quantity: 1 Physician Procedures : CPT4 Code Description Modifier [id number] - WC PHYS LEVEL 3 - EST PT 25 ICD-10 Diagnosis Description E11.621 Type 2 diabetes mellitus with foot ulcer L97.522 Non-pressure chronic ulcer of other part of left foot with fat layer exposed E11.42 Type 2  diabetes mellitus with diabetic polyneuropathy Quantity: 1 : [id number] 11042 - WC PHYS SUBQ TISS 20 SQ CM ICD-10 Diagnosis Description L97.522 Non-pressure chronic ulcer of other part of left foot with fat layer exposed Quantity: 1 Electronic Signature(s) Signed: 06/09/2020 3:26:31 PM By: Worthy Keeler PA-C Entered By: Worthy Keeler on 06/09/2020 15:26:30

## 2020-06-11 NOTE — Progress Notes (Signed)
[name], [name] ([id number]) Visit Report for 06/09/2020 Allergy List Details Patient Name: Date of Service: [name], [name] 06/09/2020 1:15 PM Medical Record Number: [id number] Patient Account Number: [id number] Date of Birth/Sex: Treating RN: 1956/08/03 (64 y.o. [name] Primary Care Shiana Rappleye: LO WNE CHA SE, YV O NNE Other Clinician: Referring Keilyn Nadal: Treating Hobson Lax/Extender: Worthy Keeler LO WNE CHA SE, YV O NNE Weeks in Treatment: 0 Allergies Active Allergies Fish Containing Products Reaction: hives, swelling mushroom Reaction: anaphylaxis Severity: Severe penicillin Reaction: throat swelling Severity: Severe tomato, raw Reaction: hives rosemary Reaction: anaphylaxis Severity: Severe Shellfish Containing Products Reaction: hives, swelling Severity: Severe aloe vera Reaction: rash bee venom protein (honey bee) Reaction: hives, swelling Severity: Moderate acetaminophen Reaction: GI upset acyclovir Reaction: unknown naproxen Reaction: unknown Allergy Notes Electronic Signature(s) Signed: 06/11/2020 5:19:54 PM By: [name] RN, BSN Entered By: [name] on 06/09/2020 13:11:11 -------------------------------------------------------------------------------- Arrival Information Details Patient Name: Date of Service: [name], [name] 06/09/2020 1:15 PM Medical Record Number: [id number] Patient Account Number: [id number] Date of Birth/Sex: Treating RN: [birth year] (64 y.o. [name] [name] Primary Care Dezzie Badilla: LO WNE CHA SE, YV O NNE Other Clinician: Referring Yashika Mask: Treating Juliann Olesky/Extender: Worthy Keeler LO WNE CHA SE, YV O NNE Weeks in Treatment: 0 Visit Information Patient Arrived: Other Arrival Time: 12:59 Accompanied By: self Transfer Assistance: None Patient Identification Verified: Yes Secondary Verification Process Completed: Yes History Since Last Visit Added or deleted any medications: No Any new allergies  or adverse reactions: No Had a fall or experienced change in activities of daily living that may affect risk of falls: No Signs or symptoms of abuse/neglect since last visito No Hospitalized since last visit: No Implantable device outside of the clinic excluding cellular tissue based products placed in the center since last visit: No Has Dressing in Place as Prescribed: Yes Electronic Signature(s) Signed: 06/11/2020 1:13:21 PM By: [name] Entered By: [name] on 06/09/2020 12:59:47 -------------------------------------------------------------------------------- Lower Extremity Assessment Details Patient Name: Date of Service: [name], [name] 06/09/2020 1:15 PM Medical Record Number: [id number] Patient Account Number: [id number] Date of Birth/Sex: Treating RN: 05-13-1956 (64 y.o. [name] Primary Care Korben Carcione: LO WNE CHA SE, YV O NNE Other Clinician: Referring Cejay Cambre: Treating Bralin Garry/Extender: Worthy Keeler LO WNE CHA SE, YV O NNE Weeks in Treatment: 0 Edema Assessment Assessed: [Left: No] [Right: No] Edema: [Left: Ye] [Right: s] Calf Left: Right: Point of Measurement: 29 cm From Medial Instep 33 cm cm Ankle Left: Right: Point of Measurement: 9 cm From Medial Instep 20.4 cm cm Vascular Assessment Pulses: Dorsalis Pedis Palpable: [Left:Yes] Blood Pressure: Brachial: [Left:162] Ankle: [Left:Dorsalis Pedis: 180 1.11] Electronic Signature(s) Signed: 06/11/2020 5:19:54 PM By: [name] RN, BSN Entered By: [name] on 06/09/2020 13:27:56 -------------------------------------------------------------------------------- Multi-Disciplinary Care Plan Details Patient Name: Date of Service: [name], [name] 06/09/2020 1:15 PM Medical Record Number: [id number] Patient Account Number: [id number] Date of Birth/Sex: Treating RN: 06-03-1956 (64 y.o. F) Baruch Gouty Primary Care Lacey Wallman: LO WNE CHA SE, YV O NNE Other  Clinician: Referring Marietta Sikkema: Treating Isidor Bromell/Extender: Worthy Keeler LO WNE CHA SE, YV O NNE Weeks in Treatment: 0 Active Inactive Abuse / Safety / Falls / Self Care Management Nursing Diagnoses: Potential for falls Goals: Patient/caregiver will verbalize/demonstrate measures taken to prevent injury and/or falls Date Initiated: 06/09/2020 Target Resolution Date: 07/07/2020 Goal Status: Active Interventions: Assess: immobility, friction, shearing, incontinence upon admission and as needed Assess impairment of mobility  on admission and as needed per policy Notes: Nutrition Nursing Diagnoses: Impaired glucose control: actual or potential Potential for alteratiion in Nutrition/Potential for imbalanced nutrition Goals: Patient/caregiver verbalizes understanding of need to maintain therapeutic glucose control per primary care physician Date Initiated: 06/09/2020 Target Resolution Date: 07/07/2020 Goal Status: Active Patient/caregiver will maintain therapeutic glucose control Date Initiated: 06/09/2020 Target Resolution Date: 07/07/2020 Goal Status: Active Interventions: Provide education on elevated blood sugars and impact on wound healing Treatment Activities: Patient referred to Primary Care Physician for further nutritional evaluation : 06/09/2020 Notes: Wound/Skin Impairment Nursing Diagnoses: Impaired tissue integrity Knowledge deficit related to ulceration/compromised skin integrity Goals: Patient/caregiver will verbalize understanding of skin care regimen Date Initiated: 06/09/2020 Target Resolution Date: 07/07/2020 Goal Status: Active Ulcer/skin breakdown will have a volume reduction of 30% by week 4 Date Initiated: 06/09/2020 Target Resolution Date: 07/07/2020 Goal Status: Active Interventions: Assess patient/caregiver ability to obtain necessary supplies Assess patient/caregiver ability to perform ulcer/skin care regimen upon admission and as needed Assess ulceration(s)  every visit Provide education on ulcer and skin care Treatment Activities: Skin care regimen initiated : 06/09/2020 Topical wound management initiated : 06/09/2020 Notes: Electronic Signature(s) Signed: 06/09/2020 6:14:05 PM By: Baruch Gouty RN, BSN Entered By: Baruch Gouty on 06/09/2020 14:28:15 -------------------------------------------------------------------------------- Pain Assessment Details Patient Name: Date of Service: [name], [name] 06/09/2020 1:15 PM Medical Record Number: [id number] Patient Account Number: [id number] Date of Birth/Sex: Treating RN: [birth year] (64 y.o. [name] Primary Care Alacia Rehmann: LO WNE CHA SE, YV O NNE Other Clinician: Referring Jaleea Alesi: Treating Marikay Roads/Extender: Worthy Keeler LO WNE CHA SE, YV O NNE Weeks in Treatment: 0 Active Problems Location of Pain Severity and Description of Pain Patient Has Paino No Site Locations Pain Management and Medication Current Pain Management: Electronic Signature(s) Signed: 06/11/2020 5:19:54 PM By: [name] RN, BSN Entered By: [name] on 06/09/2020 13:22:35 -------------------------------------------------------------------------------- Patient/Caregiver Education Details Patient Name: Date of Service: [name], [name] 8/11/2021andnbsp1:15 PM Medical Record Number: [id number] Patient Account Number: [id number] Date of Birth/Gender: Treating RN: 1956-01-11 (64 y.o. [name] [name] Primary Care Physician: LO WNE CHA SE, YV O NNE Other Clinician: Referring Physician: Treating Physician/Extender: Worthy Keeler LO WNE CHA SE, YV O NNE Weeks in Treatment: 0 Education Assessment Education Provided To: Patient Education Topics Provided Elevated Blood Sugar/ Impact on Healing: Methods: Explain/Verbal Responses: Reinforcements needed, State content correctly Offloading: Methods: Explain/Verbal Responses: Reinforcements needed, State content correctly Wound/Skin  Impairment: Methods: Explain/Verbal Responses: Reinforcements needed, State content correctly Electronic Signature(s) Signed: 06/09/2020 6:14:05 PM By: Baruch Gouty RN, BSN Entered By: Baruch Gouty on 06/09/2020 14:30:53 -------------------------------------------------------------------------------- Wound Assessment Details Patient Name: Date of Service: [name], [name] 06/09/2020 1:15 PM Medical Record Number: [id number] Patient Account Number: [id number] Date of Birth/Sex: Treating RN: 1956/06/13 (64 y.o. [name] Primary Care Katlynn Naser: LO WNE CHA SE, YV O NNE Other Clinician: Referring Tysin Salada: Treating Sonna Lipsky/Extender: Worthy Keeler LO WNE CHA SE, YV O NNE Weeks in Treatment: 0 Wound Status Wound Number: 7 Primary Diabetic Wound/Ulcer of the Lower Extremity Etiology: Wound Location: Left Calcaneus Wound Open Wounding Event: Gradually Appeared Status: Date Acquired: 03/30/2020 Comorbid Cataracts, Anemia, Sleep Apnea, Coronary Artery Disease, Weeks Of Treatment: 0 History: Hypertension, Myocardial Infarction, Type II Diabetes, Clustered Wound: No Osteoarthritis, Osteomyelitis, Neuropathy Photos Photo Uploaded By: Mikeal Hawthorne on 06/11/2020 11:28:41 Wound Measurements Length: (cm) 1.7 Width: (cm) 0.9 Depth: (cm) 0.6 Area: (cm) 1.202 Volume: (cm) 0.721 % Reduction in Area: % Reduction  in Volume: Epithelialization: None Tunneling: No Undermining: Yes Starting Position (o'clock): 6 Ending Position (o'clock): 10 Maximum Distance: (cm) 0.7 Wound Description Classification: Grade 2 Wound Margin: Well defined, not attached Exudate Amount: Medium Exudate Type: Serosanguineous Exudate Color: red, brown Foul Odor After Cleansing: No Slough/Fibrino Yes Wound Bed Granulation Amount: Large (67-100%) Exposed Structure Granulation Quality: Pink Fascia Exposed: No Necrotic Amount: Small (1-33%) Fat Layer (Subcutaneous Tissue) Exposed:  Yes Necrotic Quality: Adherent Slough Tendon Exposed: No Muscle Exposed: No Joint Exposed: No Bone Exposed: No Treatment Notes Wound #7 (Left Calcaneus) 1. Cleanse With Wound Cleanser 3. Primary Dressing Applied Hydrofera Blue 4. Secondary Dressing Dry Gauze Roll Gauze 5. Secured With Recruitment consultant) Signed: 06/11/2020 5:19:54 PM By: [name] RN, BSN Entered By: [name] on 06/09/2020 13:22:25 -------------------------------------------------------------------------------- Vitals Details Patient Name: Date of Service: [name], [name] 06/09/2020 1:15 PM Medical Record Number: [id number] Patient Account Number: [id number] Date of Birth/Sex: Treating RN: 07-15-1956 (64 y.o. F) Baruch Gouty Primary Care Denver Harder: LO WNE CHA SE, YV O NNE Other Clinician: Referring Evoleth Nordmeyer: Treating Kaleem Sartwell/Extender: Worthy Keeler LO WNE CHA SE, YV O NNE Weeks in Treatment: 0 Vital Signs Time Taken: 12:59 Temperature (F): 97.9 Height (in): 61 Pulse (bpm): 86 Source: Stated Respiratory Rate (breaths/min): 18 Weight (lbs): 170 Blood Pressure (mmHg): 162/93 Source: Stated Reference Range: 80 - 120 mg / dl Body Mass Index (BMI): 32.1 Electronic Signature(s) Signed: 06/11/2020 1:13:21 PM By: [name] Entered By: [name] on 06/09/2020 13:00:20

## 2020-06-11 NOTE — Progress Notes (Signed)
ALBERTO, SCHOCH ([id number]) Visit Report for 06/09/2020 Abuse/Suicide Risk Screen Details Patient Name: Date of Service: [name], [name] 06/09/2020 1:15 PM Medical Record Number: [id number] Patient Account Number: [id number] Date of Birth/Sex: Treating RN: 10/05/1956 (64 y.o. [name] Primary Care Miami Latulippe: LO WNE CHA SE, YV O NNE Other Clinician: Referring Skyla Champagne: Treating Emmali Karow/Extender: Worthy Keeler LO WNE CHA SE, YV O NNE Weeks in Treatment: 0 Abuse/Suicide Risk Screen Items Answer ABUSE RISK SCREEN: Has anyone close to you tried to hurt or harm you recentlyo No Do you feel uncomfortable with anyone in your familyo No Has anyone forced you do things that you didnt want to doo No Electronic Signature(s) Signed: 06/11/2020 5:19:54 PM By: [name] RN, BSN Entered By: [name] on 06/09/2020 13:15:02 -------------------------------------------------------------------------------- Activities of Daily Living Details Patient Name: Date of Service: [name], [name] 06/09/2020 1:15 PM Medical Record Number: [id number] Patient Account Number: [id number] Date of Birth/Sex: Treating RN: [birth year] (64 y.o. [name] Primary Care Stephaie Dardis: LO WNE CHA SE, YV O NNE Other Clinician: Referring Gurdeep Keesey: Treating Shihab States/Extender: Worthy Keeler LO WNE CHA SE, YV O NNE Weeks in Treatment: 0 Activities of Daily Living Items Answer Activities of Daily Living (Please select one for each item) Drive Automobile Not Able T Medications ake Need Assistance Use T elephone Need Assistance Care for Appearance Need Assistance Use T oilet Need Assistance Bath / Shower Need Assistance Dress Self Need Assistance Feed Self Completely Able Walk Completely Able Get In / Out Bed Completely Able Housework Need Assistance Prepare Meals Need Assistance Handle Money Completely Able Shop for Self Need Assistance Electronic Signature(s) Signed: 06/11/2020 5:19:54 PM  By: [name] RN, BSN Entered By: [name] on 06/09/2020 13:15:54 -------------------------------------------------------------------------------- Education Screening Details Patient Name: Date of Service: [name], [name] 06/09/2020 1:15 PM Medical Record Number: [id number] Patient Account Number: [id number] Date of Birth/Sex: Treating RN: Apr 08, 1956 (64 y.o. [name] Primary Care Suresh Audi: LO WNE CHA SE, YV O NNE Other Clinician: Referring Gracelynne Benedict: Treating Elody Kleinsasser/Extender: Worthy Keeler LO WNE CHA SE, YV O NNE Weeks in Treatment: 0 Primary Learner Assessed: Patient Learning Preferences/Education Level/Primary Language Learning Preference: Explanation, Demonstration, Printed Material Highest Education Level: College or Above Preferred Language: Diplomatic Services operational officer Language Barrier: No Translator Needed: No Memory Deficit: No Emotional Barrier: No Cultural/Religious Beliefs Affecting Medical Care: No Physical Barrier Impaired Vision: No Impaired Hearing: No Decreased Hand dexterity: No Knowledge/Comprehension Knowledge Level: High Comprehension Level: High Ability to understand written instructions: High Ability to understand verbal instructions: High Motivation Anxiety Level: Calm Cooperation: Cooperative Education Importance: Acknowledges Need Interest in Health Problems: Asks Questions Perception: Coherent Willingness to Engage in Self-Management High Activities: Readiness to Engage in Self-Management High Activities: Electronic Signature(s) Signed: 06/11/2020 5:19:54 PM By: [name] RN, BSN Entered By: [name] on 06/09/2020 13:16:36 -------------------------------------------------------------------------------- Fall Risk Assessment Details Patient Name: Date of Service: [name], [name] 06/09/2020 1:15 PM Medical Record Number: [id number] Patient Account Number: [id number] Date of Birth/Sex: Treating  RN: December 30, 1955 (64 y.o. [name] Primary Care Shavonta Gossen: LO WNE CHA SE, YV O NNE Other Clinician: Referring Verdine Grenfell: Treating Amiyrah Lamere/Extender: Worthy Keeler LO WNE CHA SE, YV O NNE Weeks in Treatment: 0 Fall Risk Assessment Items Have you had 2 or more falls in the last 12 monthso 0 No Have you had any fall that resulted in injury in the last 12 monthso 0 No FALLS RISK  SCREEN History of falling - immediate or within 3 months 0 No Secondary diagnosis (Do you have 2 or more medical diagnoseso) 15 Yes Ambulatory aid None/bed rest/wheelchair/nurse 0 Yes Crutches/cane/walker 0 No Furniture 0 No Intravenous therapy Access/Saline/Heparin Lock 0 No Gait/Transferring Normal/ bed rest/ wheelchair 0 Yes Weak (short steps with or without shuffle, stooped but able to lift head while walking, may seek 0 No support from furniture) Impaired (short steps with shuffle, may have difficulty arising from chair, head down, impaired 0 No balance) Mental Status Oriented to own ability 0 Yes Electronic Signature(s) Signed: 06/11/2020 5:19:54 PM By: [name] RN, BSN Entered By: [name] on 06/09/2020 13:17:10 -------------------------------------------------------------------------------- Foot Assessment Details Patient Name: Date of Service: [name], [name] 06/09/2020 1:15 PM Medical Record Number: [id number] Patient Account Number: [id number] Date of Birth/Sex: Treating RN: 02-24-1956 (64 y.o. [name] Primary Care Deyjah Kindel: LO WNE CHA SE, YV O NNE Other Clinician: Referring Rubye Strohmeyer: Treating Brittnie Lewey/Extender: Worthy Keeler LO WNE CHA SE, YV O NNE Weeks in Treatment: 0 Foot Assessment Items Site Locations + = Sensation present, - = Sensation absent, C = Callus, U = Ulcer R = Redness, W = Warmth, M = Maceration, PU = Pre-ulcerative lesion F = Fissure, S = Swelling, D = Dryness Assessment Right: Left: Other Deformity: No No Prior Foot Ulcer: No  No Prior Amputation: No No Charcot Joint: No No Ambulatory Status: Ambulatory Without Help Gait: Steady Electronic Signature(s) Signed: 06/11/2020 5:19:54 PM By: [name] RN, BSN Entered By: [name] on 06/09/2020 13:23:10 -------------------------------------------------------------------------------- Nutrition Risk Screening Details Patient Name: Date of Service: [name], [name] 06/09/2020 1:15 PM Medical Record Number: [id number] Patient Account Number: [id number] Date of Birth/Sex: Treating RN: 1956/09/26 (64 y.o. [name] Primary Care Miracle Mongillo: LO WNE CHA SE, YV O NNE Other Clinician: Referring Havoc Sanluis: Treating Rainier Feuerborn/Extender: Worthy Keeler LO WNE CHA SE, YV O NNE Weeks in Treatment: 0 Height (in): 61 Weight (lbs): 170 Body Mass Index (BMI): 32.1 Nutrition Risk Screening Items Score Screening NUTRITION RISK SCREEN: I have an illness or condition that made me change the kind and/or amount of food I eat 2 Yes I eat fewer than two meals per day 0 No I eat few fruits and vegetables, or milk products 0 No I have three or more drinks of beer, liquor or wine almost every day 0 No I have tooth or mouth problems that make it hard for me to eat 0 No I don't always have enough money to buy the food I need 0 No I eat alone most of the time 0 No I take three or more different prescribed or over-the-counter drugs a day 1 Yes Without wanting to, I have lost or gained 10 pounds in the last six months 0 No I am not always physically able to shop, cook and/or feed myself 2 Yes Nutrition Protocols Good Risk Protocol Moderate Risk Protocol 0 Provide education on nutrition High Risk Proctocol Risk Level: Moderate Risk Score: 5 Electronic Signature(s) Signed: 06/11/2020 5:19:54 PM By: [name] RN, BSN Entered By: [name] on 06/09/2020 13:17:18

## 2020-06-14 ENCOUNTER — Encounter: Admit: 2020-06-14 | Discharge: 2020-06-14 | Payer: MEDICARE

## 2020-06-14 MED ORDER — GLIMEPIRIDE 2 MG PO TAB
ORAL_TABLET | Freq: Every day | 1 refills | Status: AC
Start: 2020-06-14 — End: ?

## 2020-06-15 ENCOUNTER — Ambulatory Visit (INDEPENDENT_AMBULATORY_CARE_PROVIDER_SITE_OTHER): Payer: PRIVATE HEALTH INSURANCE | Admitting: Internal Medicine

## 2020-06-15 ENCOUNTER — Other Ambulatory Visit: Payer: Self-pay

## 2020-06-15 ENCOUNTER — Encounter: Payer: Self-pay | Admitting: Internal Medicine

## 2020-06-15 DIAGNOSIS — M4625 Osteomyelitis of vertebra, thoracolumbar region: Secondary | ICD-10-CM | POA: Diagnosis not present

## 2020-06-15 DIAGNOSIS — L089 Local infection of the skin and subcutaneous tissue, unspecified: Secondary | ICD-10-CM | POA: Diagnosis not present

## 2020-06-15 DIAGNOSIS — R197 Diarrhea, unspecified: Secondary | ICD-10-CM | POA: Diagnosis not present

## 2020-06-15 DIAGNOSIS — E11628 Type 2 diabetes mellitus with other skin complications: Secondary | ICD-10-CM

## 2020-06-15 NOTE — Assessment & Plan Note (Signed)
Her back pain has improved.  I will repeat her inflammatory markers today and continue her on ciprofloxacin with metronidazole.

## 2020-06-15 NOTE — Assessment & Plan Note (Signed)
I am not sure what caused the transient drainage of fluid from her posterior heel and while it cleared spontaneously but overall it seems that her foot infection has improved.  She will continue on antibiotics for now.

## 2020-06-15 NOTE — Assessment & Plan Note (Signed)
Fortunately, she has been able to tolerate her antibiotics without a recurrence of diarrhea.

## 2020-06-15 NOTE — Progress Notes (Signed)
[facility] for Infectious [facility]  Patient Active Problem List   Diagnosis Date Noted  . Diabetic infection of left foot ([place]) 05/12/2020    Priority: High  . S/P lumbar fusion 03/04/2020    Priority: High  . Osteomyelitis of vertebra of thoracolumbar region [facility]) 01/21/2020    Priority: High  . Diarrhea 09/04/2019    Priority: High  . Ulcer of left heel, limited to breakdown of skin ([place]) 02/01/2017    Priority: High  . Hip pain 05/16/2020  . IDA (iron deficiency anemia) 05/04/2020  . 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 Spinal stenosis of lumbar region 01/22/2020  . Back pain 11/17/2019  . Compression fracture of L1 lumbar vertebra ([place]) 11/17/2019  . Fracture of multiple ribs 11/17/2019  . Intractable nausea and vomiting 11/16/2019  . AKI (acute kidney injury) ([place]) 11/15/2019  . Nausea & vomiting 11/15/2019  . Pressure injury of skin 04/17/2019  . Septic arthritis of elbow, right ([place]) 04/17/2019  . 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 Sleep apnea 11/16/2017  . Diabetic foot infection ([place]) 04/06/2017  . Type 2 diabetes mellitus with hyperglycemia, with long-term current use of insulin ([place]) 04/06/2017  . Neuropathy 05/05/2015  . Diabetic peripheral neuropathy ([place]) 01/12/2015  . CKD (chronic kidney disease), stage III ([place]) 05/27/2014  . History of MI (myocardial infarction) 10/06/2013  . Nausea and vomiting 10/06/2013  . Obesity (BMI 30-39.9) 09/20/2013  . 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 Normocytic anemia 02/03/2013  . CAD (coronary artery disease) 01/11/2012  . Hepatic cirrhosis ([place]) 07/06/2010  . 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  . Hyperlipidemia 06/03/2008  . Essential hypertension 12/23/2006    Patient's Medications  New Prescriptions   No medications on file  Previous Medications   ACETAMINOPHEN (TYLENOL) 325 MG TABLET    Take 2 tablets (650 mg total) by mouth  every 6 (six) hours as needed for fever.   ATORVASTATIN (LIPITOR) 10 MG TABLET    Take 1 tablet by mouth once daily   BETHANECHOL (URECHOLINE) 10 MG TABLET    Take 1 tablet (10 mg total) by mouth 3 (three) times daily.   CIPROFLOXACIN (CIPRO) 500 MG TABLET    Take 1 tablet (500 mg total) by mouth daily.   CONTINUOUS BLOOD GLUC RECEIVER (FREESTYLE LIBRE 14 DAY READER) DEVI    1 Device by Does not apply route daily.   CONTINUOUS BLOOD GLUC SENSOR (FREESTYLE LIBRE 14 DAY SENSOR) MISC    1 Device by Does not apply route daily.   DIAZEPAM (VALIUM) 5 MG TABLET    Take 1 tablet (5 mg total) by mouth every 12 (twelve) hours as needed for anxiety.   DULOXETINE (CYMBALTA) 60 MG CAPSULE    Take 1 capsule (60 mg total) by mouth daily.   HYDROCODONE-ACETAMINOPHEN (NORCO/VICODIN) 5-325 MG TABLET    Take 1 tablet by mouth every 6 (six) hours as needed for moderate pain.   LIDOCAINE (LIDODERM) 5 %    Place 1 patch onto the skin daily. Remove & Discard patch within 12 hours or as directed by MD   LISINOPRIL (ZESTRIL) 10 MG TABLET    Take 10 mg by mouth daily.   METRONIDAZOLE (FLAGYL) 500 MG TABLET    Take 1 tablet (500 mg total) by mouth 3 (three) times daily.   MULTIPLE VITAMIN (MULTIVITAMIN WITH MINERALS) TABS TABLET    Take 1 tablet by mouth daily.   OXYCODONE (  OXY IR/ROXICODONE) 5 MG IMMEDIATE RELEASE TABLET    Take 1 tablet (5 mg total) by mouth every 3 (three) hours as needed for moderate pain ((score 4 to 6)).   PANTOPRAZOLE (PROTONIX) 40 MG TABLET    Take 1 tablet (40 mg total) by mouth daily at 6 (six) AM.   PREGABALIN (LYRICA) 75 MG CAPSULE    Take 1 capsule (75 mg total) by mouth 3 (three) times daily.   RIVAROXABAN (XARELTO) 15 MG TABS TABLET    Take 1 tablet (15 mg total) by mouth daily with supper.  Modified Medications   No medications on file  Discontinued Medications   No medications on file    Subjective: [name] [name] is in with her husband for her follow-up visit.  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  She received long courses of IV antibiotics throughout the last 6 months of last year.  When hospitalized in January she was switched to oral doxycycline for chronic suppression.  She missed her follow-up visit with me in February. Unbeknownst to me she could not tolerate doxycycline and stopped it shortly after discharge in January. She has also been treated for polymicrobial left heel infection.  Previous cultures have grown Enterococcus, Proteus, Klebsiella and Pseudomonas.  She began having severe low back pain last month the pain has become progressively severe and she has not been able to get out of bed.  As a result she has missed many doctor appointments.  She came to the ED 01/20/2020.  MRI revealed:  IMPRESSION: Destruction of the L1 vertebral body with partial destruction of the inferior endplates of K74 and L2 with abnormal enhancing paraspinal and epidural soft tissues at that level. No definable abscess. I suspect this represents atypical infection such as TB or actinomycosis. B-cell lymphoma can also give this appearance. There is moderate compression of the thecal sac at L1 as described above.  She underwent operative debridement and T11-L3 fusion by Dr. Duffy Rhody.  Operative cultures grew Pseudomonas.  She was started on IV cefepime.  Her hospital course was complicated by acute kidney injury requiring intermittent hemodialysis.  Her cefepime was changed to meropenem.  Her pain improved after her surgery.  She received a total of 29 days of IV antibiotic therapy before she decided to leave the hospital [facility] on 02/26/2020.  She was not on any antibiotic therapy until her PCP, Dr. Rossie Muskrat, started her on ciprofloxacin on 03/02/2020.    She developed watery diarrhea and some nausea and vomiting.  As a result she stopped taking ciprofloxacin on 03/31/2020.  I ordered C. difficile testing when I last saw her on  04/05/2020 but her diarrhea stopped and no sample was ever brought in.  She called on 04/19/2020 and informed us that she had developed recurrent foul-smelling drainage from her left heel.    She restarted ciprofloxacin and metronidazole 1 month ago.  She has not had any recurrent diarrhea.  She is feeling better.  Her back pain has improved some.  She rates her pain as 8 out of 10 now.  She says that several weeks ago she started draining thin black fluid from her posterior left heel.  It stopped spontaneously after 3 days.  Review of Systems: Review of Systems  Constitutional: Positive for malaise/fatigue. Negative for chills, diaphoresis, fever and weight loss.  Respiratory: Negative for cough, sputum production and shortness of breath.   Cardiovascular: Negative for chest pain.  Gastrointestinal: Negative for abdominal pain, diarrhea, nausea and vomiting.  Genitourinary: Negative for dysuria.  Musculoskeletal: Positive for back pain and joint pain.  Skin: Negative for rash.  Neurological: Negative for headaches.    Past Medical History:  Diagnosis Date  . Acute MI [facility]) 2007   presented to ED & had cardiac cath- but found to have normal coronaries. Since that point in time her PCP cares f or cardiac needs. Dr. [name] - [facility]  . Anemia   . Anginal pain ([place])   . Anxiety   . Asthma   . Bulging lumbar disc   . Cataract   . 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 Cirrhosis of liver without mention of alcohol   . Constipation   . Dehiscence of closure of skin    left partial calcaneal excision  . Depression   . Diabetes mellitus    insulin dependent, adult onset  . Episode of visual loss of left eye   . Exertional shortness of breath   . Fatty liver   . Fibromyalgia   . GERD (gastroesophageal reflux disease)   . Hepatic steatosis   . High cholesterol   . Hypertension   . MRSA (methicillin resistant Staphylococcus  aureus)   . Neuropathy    lower legs  . Osteoarthritis    hands, hips  . Proximal humerus fracture 10/15/12   Left  . PTSD (post-traumatic stress disorder)   . Renal insufficiency 05/05/2015    Social History   Tobacco Use  . Smoking status: Never Smoker  . Smokeless tobacco: Never Used  Vaping Use  . Vaping Use: Never used  Substance Use Topics  . Alcohol use: No    Alcohol/week: 0.0 standard drinks  . Drug use: No    Family History  Problem Relation Age of Onset  . Heart disease Father   . Diabetes Father   . Colitis Father   . Crohn's disease Father   . Cancer Father        leukemia  . Leukemia Father   . Diabetes Mellitus II Brother   . Kidney disease Brother   . Heart disease Brother   . Diabetes Mother   . Hypertension Mother   . Mental illness Mother   . Irritable bowel syndrome Daughter   . Diabetes Mellitus II Brother   . Kidney disease Brother   . Liver disease Brother   . Kidney disease Brother   . Heart attack Brother   . Diabetes Mellitus II Brother   . Heart disease Brother   . Liver disease Brother   . Kidney disease Brother   . Kidney disease Brother   . Diabetes Mellitus II Brother   . Diabetes Mellitus I Brother     Allergies  Allergen Reactions  . Bee Pollen Anaphylaxis  . Fish-Derived Products Hives, Shortness Of Breath, Swelling and Rash    Hives get in throat causing trouble breathing  . Mushroom Extract Complex Anaphylaxis  . Penicillins Anaphylaxis    **Tolerated cefepime March 2021 Did it involve swelling of the face/tongue/throat, SOB, or low BP? Yes Did it involve sudden or severe rash/hives, skin peeling, or any reaction on the inside of your mouth or nose? No Did you need to seek medical attention at a hospital or doctor's office? Yes When did it last happen?A few months ago If all above answers are "NO", may proceed with cephalosporin use.  [name] Kitchen  Rosemary Oil Anaphylaxis  . Shellfish Allergy Hives, Shortness Of Breath,  Swelling and Rash  . Tomato Hives and Shortness Of Breath    Hives in throat causes her trouble breathing  . Acetaminophen Other (See Comments)    GI upset  . Acyclovir And Related Other (See Comments)    Unknown rxn  . Aloe Vera Hives  . Broccoli [Brassica Oleracea] Hives  . Naproxen Other (See Comments)    Unknown rxn    Objective: Vitals:   06/15/20 1615  BP: (!) 167/92  Pulse: 75  Temp: 98 F (36.7 C)  TempSrc: Oral  Weight: 176 lb (79.8 kg)   Body mass index is 32.19 kg/m. She is no longer in a wheelchair.   Her cardiac exam reveals a regular S1 and S2 and no murmurs Her lungs are clear All back incisions have healed nicely.  I do not detect any asymmetry of her lower back although she is tender to palpation just to the right of midline. The right elbow has healed with scarring.  There is no evidence of active infection She has a superficial ulcer on the plantar surface of her left heel        Lab Results CMP     Component Value Date/Time   NA 140 04/21/2020 1450   K 4.6 04/21/2020 1450   CL 107 04/21/2020 1450   CO2 26 04/21/2020 1450   GLUCOSE 116 (H) 04/21/2020 1450   BUN 23 04/21/2020 1450   CREATININE 1.91 (H) 04/21/2020 1450   CREATININE 1.83 (H) 08/19/2019 1701   CALCIUM 9.4 04/21/2020 1450   PROT 8.2 (H) 04/21/2020 1450   ALBUMIN 3.4 (L) 04/21/2020 1450   AST 13 (L) 04/21/2020 1450   ALT 7 04/21/2020 1450   ALKPHOS 102 04/21/2020 1450   BILITOT 0.4 04/21/2020 1450   GFRNONAA 27 (L) 04/21/2020 1450   GFRAA 32 (L) 04/21/2020 1450   Sed Rate  Date Value  05/12/2020 119 mm/h (H)  04/05/2020 CANCELED  03/04/2020 91 mm/h (H)   CRP (mg/L)  Date Value  05/12/2020 21.4 (H)  04/05/2020 63.4 (H)  03/04/2020 40.8 (H)     Problem List Items Addressed This Visit      High   Osteomyelitis of vertebra of thoracolumbar region [facility])    Her back pain has improved.  I will repeat her inflammatory markers today and continue her on ciprofloxacin  with metronidazole.      Relevant Orders   C-reactive protein   Sedimentation rate   Diarrhea    Fortunately, she has been able to tolerate her antibiotics without a recurrence of diarrhea.      Diabetic infection of left foot ([place])    I am not sure what caused the transient drainage of fluid from her posterior heel and while it cleared spontaneously but overall it seems that her foot infection has improved.  She will continue on antibiotics for now.          Michel Bickers, MD [facility] for [facility] [phone] pager   [phone] cell 06/15/2020, 5:07 PM

## 2020-06-16 ENCOUNTER — Inpatient Hospital Stay: Payer: PRIVATE HEALTH INSURANCE | Attending: Hematology & Oncology | Admitting: Family

## 2020-06-16 ENCOUNTER — Inpatient Hospital Stay: Payer: PRIVATE HEALTH INSURANCE

## 2020-06-16 VITALS — BP 147/75 | HR 77 | Resp 17

## 2020-06-16 DIAGNOSIS — D6859 Other primary thrombophilia: Secondary | ICD-10-CM | POA: Diagnosis not present

## 2020-06-16 DIAGNOSIS — Z86718 Personal history of other venous thrombosis and embolism: Secondary | ICD-10-CM | POA: Diagnosis not present

## 2020-06-16 DIAGNOSIS — E7211 Homocystinuria: Secondary | ICD-10-CM

## 2020-06-16 DIAGNOSIS — Z7901 Long term (current) use of anticoagulants: Secondary | ICD-10-CM | POA: Diagnosis not present

## 2020-06-16 DIAGNOSIS — D500 Iron deficiency anemia secondary to blood loss (chronic): Secondary | ICD-10-CM

## 2020-06-16 DIAGNOSIS — D509 Iron deficiency anemia, unspecified: Secondary | ICD-10-CM | POA: Diagnosis not present

## 2020-06-16 DIAGNOSIS — D649 Anemia, unspecified: Secondary | ICD-10-CM

## 2020-06-16 DIAGNOSIS — Z79899 Other long term (current) drug therapy: Secondary | ICD-10-CM | POA: Insufficient documentation

## 2020-06-16 DIAGNOSIS — I82503 Chronic embolism and thrombosis of unspecified deep veins of lower extremity, bilateral: Secondary | ICD-10-CM

## 2020-06-16 LAB — CBC WITH DIFFERENTIAL (CANCER CENTER ONLY)
Abs Immature Granulocytes: 0.02 10*3/uL (ref 0.00–0.07)
Basophils Absolute: 0 10*3/uL (ref 0.0–0.1)
Basophils Relative: 1 %
Eosinophils Absolute: 0.1 10*3/uL (ref 0.0–0.5)
Eosinophils Relative: 2 %
HCT: 32 % — ABNORMAL LOW (ref 36.0–46.0)
Hemoglobin: 9.9 g/dL — ABNORMAL LOW (ref 12.0–15.0)
Immature Granulocytes: 0 %
Lymphocytes Relative: 20 %
Lymphs Abs: 1 10*3/uL (ref 0.7–4.0)
MCH: 26.7 pg (ref 26.0–34.0)
MCHC: 30.9 g/dL (ref 30.0–36.0)
MCV: 86.3 fL (ref 80.0–100.0)
Monocytes Absolute: 0.3 10*3/uL (ref 0.1–1.0)
Monocytes Relative: 7 %
Neutro Abs: 3.6 10*3/uL (ref 1.7–7.7)
Neutrophils Relative %: 70 %
Platelet Count: 172 10*3/uL (ref 150–400)
RBC: 3.71 MIL/uL — ABNORMAL LOW (ref 3.87–5.11)
RDW: 17.2 % — ABNORMAL HIGH (ref 11.5–15.5)
WBC Count: 5.1 10*3/uL (ref 4.0–10.5)
nRBC: 0 % (ref 0.0–0.2)

## 2020-06-16 LAB — SEDIMENTATION RATE: Sed Rate: 104 mm/h — ABNORMAL HIGH (ref 0–30)

## 2020-06-16 LAB — CMP (CANCER CENTER ONLY)
ALT: 6 U/L (ref 0–44)
AST: 12 U/L — ABNORMAL LOW (ref 15–41)
Albumin: 3.4 g/dL — ABNORMAL LOW (ref 3.5–5.0)
Alkaline Phosphatase: 73 U/L (ref 38–126)
Anion gap: 6 (ref 5–15)
BUN: 32 mg/dL — ABNORMAL HIGH (ref 8–23)
CO2: 24 mmol/L (ref 22–32)
Calcium: 9.2 mg/dL (ref 8.9–10.3)
Chloride: 104 mmol/L (ref 98–111)
Creatinine: 1.94 mg/dL — ABNORMAL HIGH (ref 0.44–1.00)
GFR, Est AFR Am: 31 mL/min — ABNORMAL LOW (ref >60)
GFR, Estimated: 27 mL/min — ABNORMAL LOW (ref >60)
Glucose, Bld: 271 mg/dL — ABNORMAL HIGH (ref 70–99)
Potassium: 5.3 mmol/L — ABNORMAL HIGH (ref 3.5–5.1)
Sodium: 134 mmol/L — ABNORMAL LOW (ref 135–145)
Total Bilirubin: 0.3 mg/dL (ref 0.3–1.2)
Total Protein: 8 g/dL (ref 6.5–8.1)

## 2020-06-16 LAB — C-REACTIVE PROTEIN: CRP: 8.8 mg/L — ABNORMAL HIGH (ref <8.0)

## 2020-06-16 LAB — D-DIMER, QUANTITATIVE: D-Dimer, Quant: 1.55 ug/mL-FEU — ABNORMAL HIGH (ref 0.00–0.50)

## 2020-06-16 MED ORDER — FOLIC ACID 1 MG PO TABS: 2.0000 mg | ORAL_TABLET | Freq: Every day | ORAL | 6 refills | Status: DC

## 2020-06-16 NOTE — Progress Notes (Signed)
Hematology and Oncology Follow Up Visit  [name] [name] [id number] 09-28-1956 64 y.o. 06/16/2020   Principle Diagnosis:  Acute DVT of both the right and left peroneal and posterior tibial veins  Hyperhomocystinemia   Iron deficiency anemia   Current Therapy: Xarelto 15 mg PO daily  Folic acid 2 mg PO daily IV iron as indicated    Interim History:  [name] [name] is here today for follow-up. She is symptomatic with fatigue. She states that she does feel better since receiving the IV iron.  She has not noted any blood loss on Xarelto. No petechiae. She does bruise easily but not in excess.  At her initial visit in June, Homocystine level was 21.4 and anticardiolipin antibody IgG level as slightly elevated at 17 U/mL.  She has noted swelling in her left foot, ankle and calf and is concerned about a new thrombus. She still has the left heel ulcer that has opened back up. She was able to see  [name] Cos, PA with wound care for debridement last week on 8/11.  The is some mild swelling in the left foot and ankle. No redness or pitting edema at this time. Pedal pulses are 1+.  She still has back discomfort and states that Dr. Megan Salon thinks she may still have a little osteomyelitis left in her back. She is still on Cipro and Flagyl.  She does have neuropathy in her hands and feet that is unchanged.  No fever, chills, n/v, cough, rash, dizziness, SOB, palpitations, abdominal pain or changes in bowel or bladder habits.  No falls or syncopal episodes to report.  She states that she has a good appetite and is staying hydrated. Her weight is stable.   ECOG Performance Status: 2 - Symptomatic, <50% confined to bed  Medications:  Allergies as of 06/16/2020      Reactions   Bee Pollen Anaphylaxis   Fish-derived Products Hives, Shortness Of Breath, Swelling, Rash   Hives get in throat causing trouble breathing   Mushroom Extract Complex Anaphylaxis   Penicillins Anaphylaxis   **Tolerated cefepime  March 2021 Did it involve swelling of the face/tongue/throat, SOB, or low BP? Yes Did it involve sudden or severe rash/hives, skin peeling, or any reaction on the inside of your mouth or nose? No Did you need to seek medical attention at a hospital or doctor's office? Yes When did it last happen?A few months ago If all above answers are "NO", may proceed with cephalosporin use.   Rosemary Oil Anaphylaxis   Shellfish Allergy Hives, Shortness Of Breath, Swelling, Rash   Tomato Hives, Shortness Of Breath   Hives in throat causes her trouble breathing   Acetaminophen Other (See Comments)   GI upset   Acyclovir And Related Other (See Comments)   Unknown rxn   Aloe Vera Hives   Broccoli [brassica Oleracea] Hives   Naproxen Other (See Comments)   Unknown rxn      Medication List       Accurate as of June 16, 2020  2:26 PM. If you have any questions, ask your nurse or doctor.        acetaminophen 325 MG tablet Commonly known as: TYLENOL Take 2 tablets (650 mg total) by mouth every 6 (six) hours as needed for fever.   atorvastatin 10 MG tablet Commonly known as: LIPITOR Take 1 tablet by mouth once daily   bethanechol 10 MG tablet Commonly known as: URECHOLINE Take 1 tablet (10 mg total) by mouth 3 (three) times daily.  ciprofloxacin 500 MG tablet Commonly known as: Cipro Take 1 tablet (500 mg total) by mouth daily.   diazepam 5 MG tablet Commonly known as: VALIUM Take 1 tablet (5 mg total) by mouth every 12 (twelve) hours as needed for anxiety.   DULoxetine 60 MG capsule Commonly known as: Cymbalta Take 1 capsule (60 mg total) by mouth daily.   FreeStyle Libre 14 Day Reader Kerrin Mo 1 Device by Does not apply route daily.   FreeStyle Libre 14 Day Sensor Misc 1 Device by Does not apply route daily.   HYDROcodone-acetaminophen 5-325 MG tablet Commonly known as: NORCO/VICODIN Take 1 tablet by mouth every 6 (six) hours as needed for moderate pain.   lidocaine 5  % Commonly known as: Lidoderm Place 1 patch onto the skin daily. Remove & Discard patch within 12 hours or as directed by MD   lisinopril 10 MG tablet Commonly known as: ZESTRIL Take 10 mg by mouth daily.   metroNIDAZOLE 500 MG tablet Commonly known as: Flagyl Take 1 tablet (500 mg total) by mouth 3 (three) times daily.   multivitamin with minerals Tabs tablet Take 1 tablet by mouth daily.   oxyCODONE 5 MG immediate release tablet Commonly known as: Oxy IR/ROXICODONE Take 1 tablet (5 mg total) by mouth every 3 (three) hours as needed for moderate pain ((score 4 to 6)).   pantoprazole 40 MG tablet Commonly known as: PROTONIX Take 1 tablet (40 mg total) by mouth daily at 6 (six) AM.   pregabalin 75 MG capsule Commonly known as: LYRICA Take 1 capsule (75 mg total) by mouth 3 (three) times daily.   Rivaroxaban 15 MG Tabs tablet Commonly known as: XARELTO Take 1 tablet (15 mg total) by mouth daily with supper.       Allergies:  Allergies  Allergen Reactions  . Bee Pollen Anaphylaxis  . Fish-Derived Products Hives, Shortness Of Breath, Swelling and Rash    Hives get in throat causing trouble breathing  . Mushroom Extract Complex Anaphylaxis  . Penicillins Anaphylaxis    **Tolerated cefepime March 2021 Did it involve swelling of the face/tongue/throat, SOB, or low BP? Yes Did it involve sudden or severe rash/hives, skin peeling, or any reaction on the inside of your mouth or nose? No Did you need to seek medical attention at a hospital or doctor's office? Yes When did it last happen?A few months ago If all above answers are "NO", may proceed with cephalosporin use.  [name] Kitchen Rosemary Oil Anaphylaxis  . Shellfish Allergy Hives, Shortness Of Breath, Swelling and Rash  . Tomato Hives and Shortness Of Breath    Hives in throat causes her trouble breathing  . Acetaminophen Other (See Comments)    GI upset  . Acyclovir And Related Other (See Comments)    Unknown rxn  . Aloe Vera  Hives  . Broccoli [Brassica Oleracea] Hives  . Naproxen Other (See Comments)    Unknown rxn    Past Medical History, Surgical history, Social history, and Family History were reviewed and updated.  Review of Systems: All other 10 point review of systems is negative.   Physical Exam:  vitals were not taken for this visit.   Wt Readings from Last 3 Encounters:  06/15/20 176 lb (79.8 kg)  02/26/20 187 lb 13.3 oz (85.2 kg)  02/18/20 190 lb 9.6 oz (86.5 kg)    Ocular: Sclerae unicteric, pupils equal, round and reactive to light Ear-nose-throat: Oropharynx clear, dentition fair Lymphatic: No cervical or supraclavicular adenopathy Lungs no rales  or rhonchi, good excursion bilaterally Heart regular rate and rhythm, no murmur appreciated Abd soft, nontender, positive bowel sounds, no liver or spleen tip palpated on exam, no fluid wave  MSK no focal spinal tenderness, no joint edema Neuro: non-focal, well-oriented, appropriate affect Breasts: Deferred   Lab Results  Component Value Date   WBC 5.1 06/16/2020   HGB 9.9 (L) 06/16/2020   HCT 32.0 (L) 06/16/2020   MCV 86.3 06/16/2020   PLT 172 06/16/2020   Lab Results  Component Value Date   FERRITIN 429 (H) 04/21/2020   IRON 32 (L) 04/21/2020   TIBC 228 (L) 04/21/2020   UIBC 196 04/21/2020   IRONPCTSAT 14 (L) 04/21/2020   Lab Results  Component Value Date   RETICCTPCT 2.8 04/21/2020   RBC 3.71 (L) 06/16/2020   No results found for: KPAFRELGTCHN, LAMBDASER, KAPLAMBRATIO No results found for: IGGSERUM, IGA, IGMSERUM No results found for: Ronnald Ramp, A1GS, A2GS, Violet Baldy, MSPIKE, SPEI   Chemistry      Component Value Date/Time   NA 140 04/21/2020 1450   K 4.6 04/21/2020 1450   CL 107 04/21/2020 1450   CO2 26 04/21/2020 1450   BUN 23 04/21/2020 1450   CREATININE 1.91 (H) 04/21/2020 1450   CREATININE 1.83 (H) 08/19/2019 1701      Component Value Date/Time   CALCIUM 9.4 04/21/2020 1450    ALKPHOS 102 04/21/2020 1450   AST 13 (L) 04/21/2020 1450   ALT 7 04/21/2020 1450   BILITOT 0.4 04/21/2020 1450       Impression and Plan: Ms. Thurow is a very pleasant 64 yo caucasian female with long standing history of anemia as well as recent diagnosis of DVT's in both the right and left peroneal and posterior tibial veins while she was bed bound in the hospital being treated for osteomyelitis.  She had an elevated homocystine level, mildly elevated anticardiolipin antibody IgG level. D-dimer was also high at 3.53. These levels were drawn again today and results are pending.  We will get an US of the left lower extremity to rule out recurrent thrombus.  We will go ahead and have her start folic acid 2 mg PO daily for the elevated Homocystine level.  We will go ahead and plan to see her again in 6 weeks and will keep an eye out for her [name] result.  She can contact our office with any questions or concerns. We can certainly see her sooner if needed.  Greater than 50% of her visit was spent counseling and coordinating care.   Laverna Peace, NP 8/18/20212:26 PM

## 2020-06-17 ENCOUNTER — Ambulatory Visit: Payer: PRIVATE HEALTH INSURANCE | Admitting: Family Medicine

## 2020-06-17 ENCOUNTER — Telehealth: Payer: Self-pay | Admitting: Family

## 2020-06-17 ENCOUNTER — Other Ambulatory Visit: Payer: Self-pay | Admitting: Family Medicine

## 2020-06-17 DIAGNOSIS — Z981 Arthrodesis status: Secondary | ICD-10-CM

## 2020-06-17 DIAGNOSIS — M8610 Other acute osteomyelitis, unspecified site: Secondary | ICD-10-CM

## 2020-06-17 DIAGNOSIS — G894 Chronic pain syndrome: Secondary | ICD-10-CM

## 2020-06-17 LAB — CARDIOLIPIN ANTIBODIES, IGG, IGM, IGA
Anticardiolipin IgA: <9 APL U/mL (ref 0–11)
Anticardiolipin IgG: 18 GPL U/mL — ABNORMAL HIGH (ref 0–14)
Anticardiolipin IgM: 9 MPL U/mL (ref 0–12)

## 2020-06-17 LAB — HOMOCYSTEINE: Homocysteine: 22 umol/L — ABNORMAL HIGH (ref 0.0–17.2)

## 2020-06-17 MED ORDER — HYDROCODONE-ACETAMINOPHEN 5-325 MG PO TABS: 1.0000 | ORAL_TABLET | Freq: Four times a day (QID) | ORAL | 0 refills | Status: DC | PRN

## 2020-06-17 NOTE — Telephone Encounter (Signed)
Medication: HYDROcodone-acetaminophen (NORCO/VICODIN) 5-325 MG tablet [[id number]]       Has the patient contacted their pharmacy?  (If no, request that the patient contact the pharmacy for the refill.) (If yes, when and what did the pharmacy advise?)     Preferred Pharmacy (with phone number or street name):   [id number] - [place],  - [address] AT [place] Phone:  [phone]  Fax:  [phone]          Agent: Please be advised that RX refills may take up to 3 business days. We ask that you follow-up with your pharmacy.

## 2020-06-17 NOTE — Telephone Encounter (Signed)
Requesting: NORCO Contract: 08/16/2017 UDS: 05/06/2018 Last OV: 05/13/2020 Next OV: 06/24/2020 Last Refill: 05/13/2020, #[id number] RF Database:   Please advise

## 2020-06-17 NOTE — Telephone Encounter (Signed)
Appointments scheduled calendar printed & mailed per 8/18 los

## 2020-06-18 ENCOUNTER — Encounter (HOSPITAL_BASED_OUTPATIENT_CLINIC_OR_DEPARTMENT_OTHER): Payer: PRIVATE HEALTH INSURANCE | Admitting: Internal Medicine

## 2020-06-18 DIAGNOSIS — E11621 Type 2 diabetes mellitus with foot ulcer: Secondary | ICD-10-CM | POA: Diagnosis not present

## 2020-06-21 NOTE — Progress Notes (Signed)
[name], [name] ([id number]) Visit Report for 06/18/2020 Debridement Details Patient Name: Date of Service: [name] [name] RE, [name] 06/18/2020 3:30 PM Medical Record Number: [id number] Patient Account Number: [id number] Date of Birth/Sex: Treating RN: 1956-08-26 (64 y.o. F) [name] [name] Primary Care Provider: LO WNE CHA SE, YV O NNE Other Clinician: Referring Provider: Treating Provider/Extender: [name] [name] LO WNE CHA SE, YV O NNE Weeks in Treatment: 1 Debridement Performed for Assessment: Wound #7 Left Calcaneus Performed By: Physician [name] [name]., MD Debridement Type: Debridement Severity of Tissue Pre Debridement: Fat layer exposed Level of Consciousness (Pre-procedure): Awake and Alert Pre-procedure Verification/Time Out Yes - 16:42 Taken: Start Time: 16:42 Pain Control: Other : benzocaine, 20% T Area Debrided (L x W): otal 2.1 (cm) x 1.5 (cm) = 3.15 (cm) Tissue and other material debrided: Viable, Non-Viable, Subcutaneous Level: Skin/Subcutaneous Tissue Debridement Description: Excisional Instrument: Curette Bleeding: Moderate Hemostasis Achieved: Silver Nitrate End Time: 16:42 Procedural Pain: 0 Post Procedural Pain: 0 Response to Treatment: Procedure was tolerated well Level of Consciousness (Post- Awake and Alert procedure): Post Debridement Measurements of Total Wound Length: (cm) 2.1 Width: (cm) 1.5 Depth: (cm) 0.2 Volume: (cm) 0.495 Character of Wound/Ulcer Post Debridement: Improved Severity of Tissue Post Debridement: Fat layer exposed Post Procedure Diagnosis Same as Pre-procedure Electronic Signature(s) Signed: 06/18/2020 5:19:06 PM By: [name] [name] Signed: 06/21/2020 7:15:26 AM By: [name] Ham MD Previous Signature: 06/18/2020 4:56:28 PM Version By: [name] [name] Entered By: [name] [name] on 06/18/2020 17:07:41 -------------------------------------------------------------------------------- HPI Details Patient Name: Date of  Service: [name] [name], [name] 06/18/2020 3:30 PM Medical Record Number: [id number] Patient Account Number: [id number] Date of Birth/Sex: Treating RN: 10/25/1956 (64 y.o. F) [name] [name] Primary Care Provider: LO WNE CHA SE, YV O NNE Other Clinician: Referring Provider: Treating Provider/Extender: [name] [name] LO WNE CHA SE, YV O NNE Weeks in Treatment: 1 History of Present Illness HPI Description: ADMISSION 11/12/2018 This is a 64 year old woman with type 2 diabetes and diabetic neuropathy. She has been dealing with a left heel plantar wound for roughly 2 years. She states this started when she pulled some skin off the area and it progressed into a wound. She also has an area on the tip of her left great toe for 1 year. She has been largely followed by Dr. Sharol Given and she had an excision of bone in the left heel in 2017. I do not see microbiology from this excision or pathology. Apparently this wound never really closed. She most recently has been using Silvadene cream. Offloading this with a scooter. The last MRI I see was in June 2018 which did not show osteomyelitis at that time. Apparently this wound is never really progressed towards healing. During her last review by Dr. Sharol Given in October it was recommended that she undergo a left BKA and she refused. She went to see a second orthopedic consult at [facility] who am recommended conservative wound care to see if this will close or progress towards closure but also warned that possible surgery may be necessary. An x-ray that was done at [facility] showed an irregular calcaneal body and calcaneal tuberosity. This is probably postprocedural. Previous x-rays at [facility] had suggested heterotrophic calcifications but I do not see this. Apparently a second ointment was added to the Silvadene which the patient thinks is because some improvement. She has not been systemically unwell. No fever or chills. She thinks the second ointment that was given to her  at [facility] has helped somewhat. 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 ABIs done in June 2018 showed a ABI of 1.12 on the right and 1.1 on the left she was biphasic bilaterally. ABIs in our clinic were noncompressible today. 11/20/18 on evaluation today patient is seen for her second visit here in the office although this is actually the first visit with me concerning an issue that she's having with her great toe and heel of the left foot. Fortunately she does not appear to be have any discomfort at this time and she does have a scooter in order to offload her foot she also has a boot for offloading. With that being said this is something that appears to have been going on for some time she was seeing Dr. due to his recommendation was that she was going to require a below knee amputation. With that being said she wanted to come to the wound center but according to the patient Dr. Sharol Given told her that "we could not help her". Nonetheless she saw another provider who suggested that it may be worth a shot for [name] to try and help her out if it all possible. Nonetheless she decided that it would be worth a trial since otherwise any the way she's gonna end up with an amputation. Obviously if we can heal the wound then that will not be the case. Again I explained to her that obviously there are no guarantees but we will give this a good try and attempts to get the wound to heal. 1/30; the patient continues to have areas on the left plantar heel and the left plantar great toe. The more worrisome area is the heel. She went for MRI on Saturday but this could not be done because she had silver alginate in the  wound bed. I would like to get this rebooked. If she does not have osteomyelitis she will need a total contact cast. We have been using silver alginate in the wounds 2/7; patient has wounds on her left plantar heel and left plantar great toe. The area on the heel has some depth although it looks about the same today. Her MRI will finally be done tomorrow. If she has osteomyelitis in the heel and then we will need to consider her for IV antibiotics and hyperbaric oxygen. If the MRI is negative she will need a total contact cast although she is wearing a cam boot and using a scooter at present. Will be using silver alginate. She will take it off tomorrow in preparation for the MRI 2/14; the patient's MRI showed extensive surgical changes with a large portion of her calcaneus removed on the left secondary to her previous surgery by Dr. Sharol Given however there is no evidence of osteomyelitis. She would therefore be a candidate for a total contact cast and we applied this for the first time today 2/21; silver collagen total contact cast. The area on the plantar left great toe is "healed" still a lot of callus on this area. Dimensions on the plantar heel not too much different some epithelialization is present however. This is an improvement 12/25/18 on evaluation today patient actually is seen for follow-up concerning issues that she has been having with her cast she states  she feels like it's wet and squishy in the bottom of her cast. The reason she does come in to have this evaluated and see what is going on. Fortunately there's no signs of infection at this time was the cast was removed. 3/6; the patient's area on the tip of her right great toe is callused but there is no open area here. She still has the fairly extensive area in the left heel. We have been using silver collagen in the wound. 01/08/19 on evaluation today patient actually appears to be doing very well in regard to her heel ulcer in my pinion to  see if you shown signs of improvement which is excellent news. There's no evidence of infection. She continues to have quite a bit of drainage but fortunately again this doesn't seem to be hindering her healing. 3/20 -Patient's foot ulcer on the left appears to be doing well, the dimensions appear encouraging, we have been using total contact cast with Prisma and will continue doing that 3/27; patient continues to make nice progress on the left plantar heel. She is using silver collagen under a total contact cast. It is been a while since I have seen this wound and it really looks a lot better. Smaller with healthy granulation 4/3; she continues to make nice progress on the left plantar heel. Using silver collagen under a total contact cast 4/10; left plantar heel. Again skin over the surface of the wound with not much in the way of adherence. This results in undermining. Using silver collagen changed to silver alginate 4/17 left plantar heel. Again not much improvement. There is no undermining today no debridement was required we used silver alginate last time because of excessive moisture 4/24 left plantar heel. Again not as much improvement as I would have liked. About 3 mm of depth. Thick callused skin around the circumference. Using silver alginate 5/1; left plantar heel this is improved this week. Less depth epithelialization is present. Using silver alginate alginate under a total contact cast 5/8; left plantar heel dimensions are about the same. The depth appears to be improved. I use silver collagen starting today under the cast 5/15 left plantar heel. Arrives today with a 2-day history of feeling like something had "slipped" while walking her dog in the cast. Unfortunately extensive area relatively to the small wound of undermining superiorly denuded epithelium. 5/22-Patient returns at 1 week after being taken off the [facility] on account of some fluid collection that was debrided and cultured  at last visit from the plantar ulcer on the left heel. The culture results are polymicrobial with Klebsiella, enterococcus, Proteus growth these organisms are sensitive to Cipro except with enterococcus which is sensitive to ampicillin but patient is highly allergic to penicillin according to her. 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 5/29; the patient arrived last week with a new wound on her left plantar great toe. With regards to the culture that I did 2 weeks ago of her deteriorating heel wound this grew Klebsiella and enterococcus. She was given Cipro however the enterococcus would not be covered well by a quinolone. She is allergic to penicillin. I will give her linezolid 600 twice daily x5 days 6/12; the patient continues to have a wound on her left and after she returns to the [name] there is undermining laterally. She has the new wound from this 2 weeks or so ago on the  plantar tip of her left great toe. She had a fall today scraping the dorsal surface of the left fifth 7/14; READMISSION since the patient was last here she was hospitalized from 04/15/2019 through 04/22/2019. She had presented to an outside ER after falling and hitting her elbow. She had had previous surgery on the elbow and fractures several years ago. An x-ray was negative and she was sent home. She is readmitted with sepsis and acute renal failure secondary to septic arthritis of the elbow. Cultures apparently showed pansensitive staph aureus however she has a severe beta-lactam allergy. She was treated with vancomycin. Apparently the vancomycin is completed and her PICC line is removed although she is seeing Dr. Megan Salon tomorrow due to continued pain and swelling. In the hospital she had an IandD by Dr. [name] of orthopedics. The original surgery was on 04/17/2019 and it was felt that she had septic arthritis. She is still having a  lot of pain and swelling in the right elbow and apparently is due to see Dr. Megan Salon tomorrow and what she thinks is that she will be restarted on antibiotics. With regards to her heel wounds/left foot wounds. Her arterial studies were checked and her ABIs were within normal limits. X-ray showed plantar foot ulcer negative for osteomyelitis postop resection of the posterior calcaneus. She has been using silver alginate to the heel 8/4-Patient returns after being seen on 7/14, we are using silver alginate to the calcaneal wound, she is continuing to receive care for her right elbow septic arthritis 8/14- Patient returns after 1 week, she is being seen by the surgical group for her right elbow, she is here for the left calcaneal wound for which we are using silver alginate this is about the same 8/21; this is a patient I readmitted to the clinic 5 weeks ago. She is continuing to have difficulties with a septic arthritis of the right elbow she had after a fall and apparently has had surgical IandD's since the last time we saw her. She is also following with Dr. Megan Salon of infectious disease and is apparently on 3 oral antibiotics although at the time of this dictation I am not sure what they are. She comes in today with a necrotic surface on the left great toe with a blister laterally. This was still clearly an open wound. She had purulent drainage coming out of this. The original wound on the plantar aspect of her right heel in the setting of a Charcot foot is deep not open to bone but certainly not any better at all. We have been using silver alginate 8/28; dealing with septic arthritis of the right elbow. May need to go for further surgery here in the second week of September. She had a blister on the left great toe that was purulent [place] draining last week although a culture did not grow anything [already on antibiotics through infectious disease for her elbow]. The punched-out area on her heel is  just like it was when she first came in. This almost closed with a total contact cast there are no options for that now. The patient states she cannot stay off her foot having to do housework X-ray of the foot showed a moderate bunion and severe degenerative changes at the first metatarsal phalangeal joint there was a moderate plantar calcaneal spur. We will need to check the location of this. 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 She is apparently on a  3 times daily medication for her MRSA I will need to see what that is. It is oral. We are using silver collagen to the punched-out area on her left heel and to the summer superficial area of the left great toe. She is offloading this is best she can although judging by the amount of callus it is not enough. She thinks she has enough strength in her right elbow now to use her scooter. 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 Hale Bogus notes and infectious disease. We are using silver collagen to the punched-out areas on her left heel and to the plantar aspect of her left great toe she is now using her scooter to get around and to help offload these areas 10/2; 2-week follow-up. Still on clindamycin I believe for the left elbow she is going for an MRI of the elbow over the weekend. She is then going to see orthopedics and infectious disease. The area on the left heel is certainly no better. This does not probe to bone however there is green drainage. She has an area on the tip of her toe. The area on the heel is in a wound we almost closed at one point with total contact casting. 10/9; one-week follow-up. Culture I did of the heel last week which was a swab culture admittedly showed moderate Enterococcus faecalis  moderate Pseudomonas. The wound itself looks about the same. There is no palpable bone although the depth of it closely approximates bone. The heel is swollen but not erythematous. Her MRI is booked for next week. She also has an MRI of the right elbow. I've also lifted the last consult from Dr. Megan Salon who is following her for septic arthritis of the right elbow. I did not see him specifically comment on her left heel however the patient states that he is aware of this. I did not specifically address the organisms I cultured last week because of the possible effect of any additional cultures that will be done on the elbow. Additionally these were superficial not bone cultures 10/16; her MRI of the heel was put back to next week. She did have her elbow done. I have been in contact with Dr. Megan Salon about my concerns about osteomyelitis of the left heel. We have been using collagen. She also has a wound on the plantar tip of her first toe 10/23; her MRI of the heel was negative for osteomyelitis but suggested a small abscess. She also had an MRI of her elbow that showed findings compatible with a septic joint. She went to see Dr. Megan Salon and she is going to have both oral and IV antibiotics which I am sure will cover any infection in the heel. I did not actually see his note today. We are using silver collagen offloading the heel with a heel offloading boot 11/6; patient continues with silver collagen to both wound areas on her plantar left heel and plantar left great toe. She remains on daptomycin by Dr. Megan Salon of infectious disease. She complains of diarrhea. Dr. Megan Salon is aware of this. 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 she has been put on oral vancomycin I think a  lot of high clinical suspicion for pseudomembranous colitis Because of the right  elbow there are no options to completely off her left foot beyond the heel offloading boot we have now. Fortunately the left heel ulcer itself has remained static some improvement in the plantar left great toe 11/20; patient arrives for review of her left heel ulcer. I also note she has a difficult time with septic arthritis of the right elbow. She currently is on IV daptomycin which seems to have helped the drainage in her elbow [MSSA]. Because of high concern for pseudomembranous colitis she was also put on vancomycin orally and Flagyl orally. She was on Levaquin but that was discontinued because of the concern about C. difficile. She states her diarrhea is better. She arrives in clinic today with a large area of denuded skin on the lateral part of the heel. This had totally separated. We have been using silver alginate to the wound areas. She arrived in a scooter telling me she is offloading the foot is much as possible. 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 Dr. Megan Salon stopped the daptomycin and now has her on vancomycin 1000 mg IV daily. This is supposed to go onto 1/18. She is being referred to [facility] for what sounds like a elbow replacement type operation for her MSSA septic arthritis. She arrives in clinic today with a blister on the medial part of the wound on her heel. She still has a lot of callus around the heel although the surface of the wound on the left heel looks somewhat better. READMISSION 03/19/2020 [name] [name] is a 64 year old woman we have followed for almost all of 2020 with a neuropathic wound on her left heel and the tip of her left great toe.. We she had previously had a partial calcanectomy by Dr. Sharol Given because of underlying osteomyelitis. She also had a history of MRSA  even when we admitted her to the clinic. An MRI when she first came into our clinic did not show osteomyelitis. We put her in a total contact cast and gradually this contracted to the point it was almost closed however in that same timeframe she had a fall. 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 We had to stop putting her in a cast partially because of infection and partially because she could not support herself with the elbow. Things went progressively downhill. When we last saw her at the end of December she had a large open wound on the left heel. She had had recurrent infections in the right elbow. It was felt that either the heel lift: Ice her elbow or vice versa. We never had MRSA I do not believe cultured in the left heel however. 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 She underwent an operative debridement and a T11-L3 fusion. An operative culture grew Pseudomonas. She was treated with cefepime and required readmission to the hospital with acute kidney failure requiring temporary dialysis. She was discharged to rehab I believe she signed herself out [place]. He has been following with her primary doctor and she comes in the clinic today with miraculously the left heel totally closed. She is followed up with Dr. Megan Salon of infectious disease he does not feel she has  any evidence of infection in her elbow or her heel for that matter and the heel is actually fully epithelialized. She is on ciprofloxacin 500 twice daily I think mostly directed at Pseudomonas. The patient is convinced that it was the treatment of the Pseudomonas in her back that ultimately led to closure of her heel and that certainly possible or could have been because she was nonambulatory in the hospital for 3 months according to her. She is still using her heel offloading boot and she says she is "learning to walk  again" Readmission: 06/09/2020 patient presents today for reevaluation here in the clinic following a reopening of her heel ulcer. She tells [name] that this actually occurred about 2 weeks after she was last seen in May of this year though it is now the beginning of August and she has not come back until now to see [name]. Nonetheless she does have an open wound there is been no x-rays at this time infectious disease has been monitoring him following this up until this point. They made the recommendation that she come to see [name]. Fortunately there is no signs of active infection systemically though she does tell me that she had Pseudomonas in the spine when she had her back surgery she is currently on antibiotics for that. 8/20; this is a patient who I saw 1 time in late May. Apparently shortly after she was here the area on her plantar left heel started to drain again and she had an open wound. She has been going to her primary physician and has been following with Dr. Megan Salon. 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 She is currently on 3 times a day prophylactic ciprofloxacin. She is admitted to the clinic last week for review of the wound on the left heel. Notable for the fact that she had a partial calcanectomy by Dr. Sharol Given 3 or 4 years ago. She says she is using her motorized wheelchair again as an off loader but she came in in regular shoes. Electronic Signature(s) Signed: 06/21/2020 7:15:26 AM By: [name] Ham MD Entered By: [name] [name] on 06/18/2020 17:11:02 -------------------------------------------------------------------------------- Physical Exam Details Patient Name: Date of Service: [name] [name] RE, [name] 06/18/2020 3:30 PM Medical Record Number: [id number] Patient Account Number: [id number] Date of Birth/Sex: Treating RN: Mar 17, 1956 (64 y.o. [name] [name] Primary Care Provider: LO  WNE CHA SE, YV O NNE Other Clinician: Referring Provider: Treating Provider/Extender: [name] [name] LO WNE CHA SE, YV O NNE Weeks in Treatment: 1 Notes Wound exam; plantar left heel. This does not appear to be too much different from the pictures when Dr. Megan Salon has seen her. She has thick areas of skin and subcutaneous tissue around the wound bed this would not allow for epithelialization but truthfully the wound bed does not look that healthy either. Considerable debridement done of both the circumference and the wound surface. Hemostasis with silver nitrate. There is no erythema around the wound and no exposed bone. Electronic Signature(s) Signed: 06/21/2020 7:15:26 AM By: [name] Ham MD Entered By: [name] [name] on 06/18/2020 17:11:59 -------------------------------------------------------------------------------- Physician Orders Details Patient Name: Date of Service: [name] [name], [name] 06/18/2020 3:30 PM Medical Record Number: [id number] Patient Account Number: [id number] Date of Birth/Sex: Treating RN: Dec 19, 1955 (64 y.o. F) [name], [facility] Primary Care Provider: LO WNE CHA SE, YV O NNE Other Clinician: Referring Provider: Treating Provider/Extender: Dellia Nims,  Legrand Como LO WNE CHA SE, YV O NNE Weeks in Treatment: 1 Verbal / Phone Orders: No Diagnosis Coding ICD-10 Coding Code Description E11.621 Type 2 diabetes mellitus with foot ulcer L97.522 Non-pressure chronic ulcer of other part of left foot with fat layer exposed E11.42 Type 2 diabetes mellitus with diabetic polyneuropathy Follow-up Appointments Return Appointment in 1 week. Dressing Change Frequency Wound #7 Left Calcaneus Change Dressing every other day. Skin Barriers/Peri-Wound Care Moisturizing lotion - to foot with dressing changes Wound Cleansing Wound #7 Left Calcaneus May shower and wash wound with soap and water. Primary Wound Dressing Wound #7 Left Calcaneus Hydrofera Blue - ready Secondary  Dressing Wound #7 Left Calcaneus Kerlix/Rolled Gauze Dry Gauze Off-Loading Other: - Heel offloader shoe Electronic Signature(s) Signed: 06/18/2020 4:56:28 PM By: [name] [name] Signed: 06/21/2020 7:15:26 AM By: [name] Ham MD Entered By: [name] [name] on 06/18/2020 16:44:53 -------------------------------------------------------------------------------- Problem List Details Patient Name: Date of Service: [name] [name], [name] 06/18/2020 3:30 PM Medical Record Number: [id number] Patient Account Number: [id number] Date of Birth/Sex: Treating RN: 1955-11-12 (64 y.o. F) [name] [name] Primary Care Provider: LO WNE CHA SE, YV O NNE Other Clinician: Referring Provider: Treating Provider/Extender: [name] [name] LO WNE CHA SE, YV O NNE Weeks in Treatment: 1 Active Problems ICD-10 Encounter Code Description Active Date MDM Diagnosis E11.621 Type 2 diabetes mellitus with foot ulcer 06/09/2020 No Yes L97.522 Non-pressure chronic ulcer of other part of left foot with fat layer exposed 06/09/2020 No Yes E11.42 Type 2 diabetes mellitus with diabetic polyneuropathy 06/09/2020 No Yes Inactive Problems Resolved Problems Electronic Signature(s) Signed: 06/21/2020 7:15:26 AM By: [name] Ham MD Previous Signature: 06/18/2020 4:56:28 PM Version By: [name] [name] Entered By: [name] [name] on 06/18/2020 17:07:14 -------------------------------------------------------------------------------- Progress Note Details Patient Name: Date of Service: [name] [name], [name] 06/18/2020 3:30 PM Medical Record Number: [id number] Patient Account Number: [id number] Date of Birth/Sex: Treating RN: [birth year] (64 y.o. F) [name] [name] Primary Care Provider: LO WNE CHA SE, YV O NNE Other Clinician: Referring Provider: Treating Provider/Extender: [name] [name] LO WNE CHA SE, YV O NNE Weeks in Treatment: 1 Subjective History of Present Illness (HPI) ADMISSION 11/12/2018 This is a  64 year old woman with type 2 diabetes and diabetic neuropathy. She has been dealing with a left heel plantar wound for roughly 2 years. She states this started when she pulled some skin off the area and it progressed into a wound. She also has an area on the tip of her left great toe for 1 year. She has been largely followed by Dr. Sharol Given and she had an excision of bone in the left heel in 2017. I do not see microbiology from this excision or pathology. Apparently this wound never really closed. She most recently has been using Silvadene cream. Offloading this with a scooter. The last MRI I see was in June 2018 which did not show osteomyelitis at that time. Apparently this wound is never really progressed towards healing. During her last review by Dr. Sharol Given in October it was recommended that she undergo a left BKA and she refused. She went to see a second orthopedic consult at [facility] who am recommended conservative wound care to see if this will close or progress towards closure but also warned that possible surgery may be necessary. An x-ray that was done at [facility] showed an irregular calcaneal body and calcaneal tuberosity. This is probably postprocedural. Previous x-rays at [facility] had suggested heterotrophic calcifications but I do not see this. Apparently a second  ointment was added to the Silvadene which the patient thinks is because some improvement. She has not been systemically unwell. No fever or chills. She thinks the second ointment that was given to her at [facility] has helped somewhat. 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 ABIs done in June 2018 showed a ABI of 1.12 on the right and 1.1  on the left she was biphasic bilaterally. ABIs in our clinic were noncompressible today. 11/20/18 on evaluation today patient is seen for her second visit here in the office although this is actually the first visit with me concerning an issue that she's having with her great toe and heel of the left foot. Fortunately she does not appear to be have any discomfort at this time and she does have a scooter in order to offload her foot she also has a boot for offloading. With that being said this is something that appears to have been going on for some time she was seeing Dr. due to his recommendation was that she was going to require a below knee amputation. With that being said she wanted to come to the wound center but according to the patient Dr. Sharol Given told her that "we could not help her". Nonetheless she saw another provider who suggested that it may be worth a shot for [name] to try and help her out if it all possible. Nonetheless she decided that it would be worth a trial since otherwise any the way she's gonna end up with an amputation. Obviously if we can heal the wound then that will not be the case. Again I explained to her that obviously there are no guarantees but we will give this a good try and attempts to get the wound to heal. 1/30; the patient continues to have areas on the left plantar heel and the left plantar great toe. The more worrisome area is the heel. She went for MRI on Saturday but this could not be done because she had silver alginate in the wound bed. I would like to get this rebooked. If she does not have osteomyelitis she will need a total contact cast. We have been using silver alginate in the wounds 2/7; patient has wounds on her left plantar heel and left plantar great toe. The area on the heel has some depth although it looks about the same today. Her MRI will finally be done tomorrow. If she has osteomyelitis in the heel and then we will need to consider her for IV  antibiotics and hyperbaric oxygen. If the MRI is negative she will need a total contact cast although she is wearing a cam boot and using a scooter at present. Will be using silver alginate. She will take it off tomorrow in preparation for the MRI 2/14; the patient's MRI showed extensive surgical changes with a large portion of her calcaneus removed on the left secondary to her previous surgery by Dr. Sharol Given however there is no evidence of osteomyelitis. She would therefore be a candidate for a total contact cast and we applied this for the first time today 2/21; silver collagen total contact cast. The area on the plantar left great toe is "healed" still a lot of callus on  this area. Dimensions on the plantar heel not too much different some epithelialization is present however. This is an improvement 12/25/18 on evaluation today patient actually is seen for follow-up concerning issues that she has been having with her cast she states she feels like it's wet and squishy in the bottom of her cast. The reason she does come in to have this evaluated and see what is going on. Fortunately there's no signs of infection at this time was the cast was removed. 3/6; the patient's area on the tip of her right great toe is callused but there is no open area here. She still has the fairly extensive area in the left heel. We have been using silver collagen in the wound. 01/08/19 on evaluation today patient actually appears to be doing very well in regard to her heel ulcer in my pinion to see if you shown signs of improvement which is excellent news. There's no evidence of infection. She continues to have quite a bit of drainage but fortunately again this doesn't seem to be hindering her healing. 3/20 -Patient's foot ulcer on the left appears to be doing well, the dimensions appear encouraging, we have been using total contact cast with Prisma and will continue doing that 3/27; patient continues to make nice progress  on the left plantar heel. She is using silver collagen under a total contact cast. It is been a while since I have seen this wound and it really looks a lot better. Smaller with healthy granulation 4/3; she continues to make nice progress on the left plantar heel. Using silver collagen under a total contact cast 4/10; left plantar heel. Again skin over the surface of the wound with not much in the way of adherence. This results in undermining. Using silver collagen changed to silver alginate 4/17 left plantar heel. Again not much improvement. There is no undermining today no debridement was required we used silver alginate last time because of excessive moisture 4/24 left plantar heel. Again not as much improvement as I would have liked. About 3 mm of depth. Thick callused skin around the circumference. Using silver alginate 5/1; left plantar heel this is improved this week. Less depth epithelialization is present. Using silver alginate alginate under a total contact cast 5/8; left plantar heel dimensions are about the same. The depth appears to be improved. I use silver collagen starting today under the cast 5/15 left plantar heel. Arrives today with a 2-day history of feeling like something had "slipped" while walking her dog in the cast. Unfortunately extensive area relatively to the small wound of undermining superiorly denuded epithelium. 5/22-Patient returns at 1 week after being taken off the TCC on account of some fluid collection that was debrided and cultured at last visit from the plantar ulcer on the left heel. The culture results are polymicrobial with Klebsiella, enterococcus, Proteus growth these organisms are sensitive to Cipro except with enterococcus which is sensitive to ampicillin but patient is highly allergic to penicillin according to her. 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 5/29; the  patient arrived last week with a new wound on her left plantar great toe. With regards to the culture that I did 2 weeks ago of her deteriorating heel wound this grew Klebsiella and enterococcus. She was given Cipro however the enterococcus would not be covered well by a quinolone. She is allergic to penicillin. I will give her  linezolid 600 twice daily x5 days 6/12; the patient continues to have a wound on her left and after she returns to the [name] there is undermining laterally. She has the new wound from this 2 weeks or so ago on the plantar tip of her left great toe. She had a fall today scraping the dorsal surface of the left fifth 7/14; READMISSION since the patient was last here she was hospitalized from 04/15/2019 through 04/22/2019. She had presented to an outside ER after falling and hitting her elbow. She had had previous surgery on the elbow and fractures several years ago. An x-ray was negative and she was sent home. She is readmitted with sepsis and acute renal failure secondary to septic arthritis of the elbow. Cultures apparently showed pansensitive staph aureus however she has a severe beta-lactam allergy. She was treated with vancomycin. Apparently the vancomycin is completed and her PICC line is removed although she is seeing Dr. Megan Salon tomorrow due to continued pain and swelling. In the hospital she had an IandD by Dr. [name] of orthopedics. The original surgery was on 04/17/2019 and it was felt that she had septic arthritis. She is still having a lot of pain and swelling in the right elbow and apparently is due to see Dr. Megan Salon tomorrow and what she thinks is that she will be restarted on antibiotics. With regards to her heel wounds/left foot wounds. Her arterial studies were checked and her ABIs were within normal limits. X-ray showed plantar foot ulcer negative for osteomyelitis postop resection of the posterior calcaneus. She has been using silver alginate to the  heel 8/4-Patient returns after being seen on 7/14, we are using silver alginate to the calcaneal wound, she is continuing to receive care for her right elbow septic arthritis 8/14- Patient returns after 1 week, she is being seen by the surgical group for her right elbow, she is here for the left calcaneal wound for which we are using silver alginate this is about the same 8/21; this is a patient I readmitted to the clinic 5 weeks ago. She is continuing to have difficulties with a septic arthritis of the right elbow she had after a fall and apparently has had surgical IandD's since the last time we saw her. She is also following with Dr. Megan Salon of infectious disease and is apparently on 3 oral antibiotics although at the time of this dictation I am not sure what they are. She comes in today with a necrotic surface on the left great toe with a blister laterally. This was still clearly an open wound. She had purulent drainage coming out of this. The original wound on the plantar aspect of her right heel in the setting of a Charcot foot is deep not open to bone but certainly not any better at all. We have been using silver alginate 8/28; dealing with septic arthritis of the right elbow. May need to go for further surgery here in the second week of September. She had a blister on the left great toe that was purulent [place] draining last week although a culture did not grow anything [already on antibiotics through infectious disease for her elbow]. The punched-out area on her heel is just like it was when she first came in. This almost closed with a total contact cast there are no options for that now. The patient states she cannot stay off her foot having to do housework X-ray of the foot showed a moderate bunion and severe degenerative changes at the  first metatarsal phalangeal joint there was a moderate plantar calcaneal spur. We will need to check the location of this. No other comments on bone  destruction 9/4; still dealing with septic arthritis of the left elbow. 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 It is oral. We are using silver collagen to the punched-out area on her left heel and to the summer superficial area of the left great toe. She is offloading this is best she can although judging by the amount of callus it is not enough. She thinks she has enough strength in her right elbow now to use her scooter. 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 Hale Bogus notes and infectious disease. We are using silver collagen to the punched-out areas on her left heel and to the plantar aspect of her left great toe she is now using her scooter to get around and to help offload these areas 10/2; 2-week follow-up. Still on clindamycin I believe for the left elbow she is going for an MRI of the elbow over the weekend. She is then going to see orthopedics and infectious disease. The area on the left heel is certainly no better. This does not probe to bone however there is green drainage. She has an area on the tip of her toe. The area on the heel is in a wound we almost closed at one point with total contact casting. 10/9; one-week follow-up. Culture I did of the heel last week which was a swab culture admittedly showed moderate Enterococcus faecalis moderate Pseudomonas. The wound itself looks about the same. There is no palpable bone although the depth of it closely approximates bone. The heel is swollen but not erythematous. Her MRI is booked for next week. She also has an MRI of the right elbow. I've also lifted the last consult from Dr. Megan Salon who is following her for septic arthritis of the right elbow. I did not see him specifically comment on her left heel however  the patient states that he is aware of this. I did not specifically address the organisms I cultured last week because of the possible effect of any additional cultures that will be done on the elbow. Additionally these were superficial not bone cultures 10/16; her MRI of the heel was put back to next week. She did have her elbow done. I have been in contact with Dr. Megan Salon about my concerns about osteomyelitis of the left heel. We have been using collagen. She also has a wound on the plantar tip of her first toe 10/23; her MRI of the heel was negative for osteomyelitis but suggested a small abscess. She also had an MRI of her elbow that showed findings compatible with a septic joint. She went to see Dr. Megan Salon and she is going to have both oral and IV antibiotics which I am sure will cover any infection in the heel. I did not actually see his note today. We are using silver collagen offloading the heel with a heel offloading boot 11/6; patient continues with silver collagen to both wound areas on her plantar left heel and plantar left great toe. She remains on daptomycin by Dr. Megan Salon of infectious disease. She complains of diarrhea. Dr. Megan Salon is aware of  this. 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 she has been put on oral vancomycin I think a lot of high clinical suspicion for pseudomembranous colitis Because of the right elbow there are no options to completely off her left foot beyond the heel offloading boot we have now. Fortunately the left heel ulcer itself has remained static some improvement in the plantar left great toe 11/20; patient arrives for review of her left heel ulcer. I also note she has a difficult time with septic arthritis of the right elbow. She currently is on IV daptomycin which seems to have helped the drainage in her elbow  [MSSA]. Because of high concern for pseudomembranous colitis she was also put on vancomycin orally and Flagyl orally. She was on Levaquin but that was discontinued because of the concern about C. difficile. She states her diarrhea is better. She arrives in clinic today with a large area of denuded skin on the lateral part of the heel. This had totally separated. We have been using silver alginate to the wound areas. She arrived in a scooter telling me she is offloading the foot is much as possible. 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 Dr. Megan Salon stopped the daptomycin and now has her on vancomycin 1000 mg IV daily. This is supposed to go onto 1/18. She is being referred to [place] for what sounds like a elbow replacement type operation for her MSSA septic arthritis. She arrives in clinic today with a blister on the medial part of the wound on her heel. She still has a lot of callus around the heel although the surface of the wound on the left heel looks somewhat better. READMISSION 03/19/2020 [name] [name] is a 64 year old woman we have followed for almost all of 2020 with a neuropathic wound on her left heel and the tip of her left great toe.. We she had previously had a partial calcanectomy by Dr. Sharol Given because of underlying osteomyelitis. She also had a history of MRSA even when we admitted her to the clinic. An MRI when she first came into our clinic did not show osteomyelitis. We put her in a total contact cast and gradually this contracted to the point it was almost closed however in that same timeframe she had a fall. 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 We had to stop putting her in a cast partially because of infection and partially because she could not  support herself with the elbow. Things went progressively downhill. When we last saw her at the end of December she had a large open wound on the left heel. She had had recurrent infections in the right elbow. It was felt that either the heel lift: Ice her elbow or vice versa. We never had MRSA I do not believe cultured in the left heel however. 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 She underwent an operative debridement and a T11-L3 fusion. An operative culture grew Pseudomonas. She was treated with cefepime and required readmission to the hospital with acute kidney failure requiring temporary dialysis. She was discharged to rehab I believe she signed herself  out [place]. He has been following with her primary doctor and she comes in the clinic today with miraculously the left heel totally closed. She is followed up with Dr. Megan Salon of infectious disease he does not feel she has any evidence of infection in her elbow or her heel for that matter and the heel is actually fully epithelialized. She is on ciprofloxacin 500 twice daily I think mostly directed at Pseudomonas. The patient is convinced that it was the treatment of the Pseudomonas in her back that ultimately led to closure of her heel and that certainly possible or could have been because she was nonambulatory in the hospital for 3 months according to her. She is still using her heel offloading boot and she says she is "learning to walk again" Readmission: 06/09/2020 patient presents today for reevaluation here in the clinic following a reopening of her heel ulcer. She tells [name] that this actually occurred about 2 weeks after she was last seen in May of this year though it is now the beginning of August and she has not come back until now to see [name]. Nonetheless she does have an open wound there is been no x-rays at this time infectious disease has been monitoring him  following this up until this point. They made the recommendation that she come to see [name]. Fortunately there is no signs of active infection systemically though she does tell me that she had Pseudomonas in the spine when she had her back surgery she is currently on antibiotics for that. 8/20; this is a patient who I saw 1 time in late May. Apparently shortly after she was here the area on her plantar left heel started to drain again and she had an open wound. She has been going to her primary physician and has been following with Dr. Megan Salon. 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 She is currently on 3 times a day prophylactic ciprofloxacin. She is admitted to the clinic last week for review of the wound on the left heel. Notable for the fact that she had a partial calcanectomy by Dr. Sharol Given 3 or 4 years ago. She says she is using her motorized wheelchair again as an off loader but she came in in regular shoes. Objective Constitutional Vitals Time Taken: 4:17 PM, Height: 61 in, Weight: 170 lbs, BMI: 32.1, Temperature: 98.3 F, Pulse: 80 bpm, Respiratory Rate: 18 breaths/min, Blood Pressure: 150/82 mmHg. Integumentary (Hair, Skin) Wound #7 status is Open. Original cause of wound was Gradually Appeared. The wound is located on the Left Calcaneus. The wound measures 2.1cm length x 1.5cm width x 0.2cm depth; 2.474cm^2 area and 0.495cm^3 volume. There is Fat Layer (Subcutaneous Tissue) Exposed exposed. There is no tunneling or undermining noted. There is a medium amount of serosanguineous drainage noted. The wound margin is well defined and not attached to the wound base. There is large (67-100%) pink granulation within the wound bed. There is a small (1-33%) amount of necrotic tissue within the wound bed including Adherent Slough. 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  There was a Excisional Skin/Subcutaneous Tissue Debridement with a total area of 3.15 sq cm performed by [name] [name]., MD. With the following instrument(s): Curette to remove Viable and Non-Viable tissue/material. Material removed includes Subcutaneous Tissue after achieving pain control using Other (benzocaine, 20%). No specimens were taken. A time out was conducted at 16:42, prior to the start of the procedure. A Moderate amount of bleeding was controlled with Silver Nitrate. The procedure was tolerated well with a pain level of 0 throughout and a pain level of 0 following the procedure. Post Debridement Measurements: 2.1cm length x 1.5cm width x 0.2cm depth; 0.495cm^3 volume. Character of Wound/Ulcer Post Debridement is improved. Severity of Tissue Post Debridement is: Fat layer exposed. Post procedure Diagnosis Wound #7: Same as Pre-Procedure Plan Follow-up Appointments: Return Appointment in 1 week. Dressing Change Frequency: Wound #7 Left Calcaneus: Change Dressing every other day. Skin Barriers/Peri-Wound Care: Moisturizing lotion - to foot with dressing changes Wound Cleansing: Wound #7 Left Calcaneus: May shower and wash wound with soap and water. Primary Wound Dressing: Wound #7 Left Calcaneus: Hydrofera Blue - ready Secondary Dressing: Wound #7 Left Calcaneus: Kerlix/Rolled Gauze Dry Gauze Off-Loading: Other: - Heel offloader shoe 1. always a worrisome issue for this patient. She has had osteomyelitis in this area in the past and has had surgery. 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 At this point  I do not know that I have really any major suspicions that this is osteomyelitis or infected. She tells me that Dr. Megan Salon recently did blood work on her. 2. Debrided as noted. I am going to continue the [facility] ready. Gave her a heel off loader 3. No cultures or additional antibiotics Electronic Signature(s) Signed: 06/21/2020 7:15:26 AM By: [name] Ham MD Entered By: [name] [name] on 06/18/2020 17:21:16 -------------------------------------------------------------------------------- SuperBill Details Patient Name: Date of Service: [name] [name] RE, [name] 06/18/2020 Medical Record Number: [id number] Patient Account Number: [id number] Date of Birth/Sex: Treating RN: [birth year] (64 y.o. F) [name], [facility] Primary Care Provider: LO WNE CHA SE, YV O NNE Other Clinician: Referring Provider: Treating Provider/Extender: [name] [name] LO WNE CHA SE, YV O NNE Weeks in Treatment: 1 Diagnosis Coding ICD-10 Codes Code Description E11.621 Type 2 diabetes mellitus with foot ulcer L97.522 Non-pressure chronic ulcer of other part of left foot with fat layer exposed E11.42 Type 2 diabetes mellitus with diabetic polyneuropathy Facility Procedures CPT4 Code: [id number] Description: 25003 - DEB SUBQ TISSUE 20 SQ CM/< ICD-10 Diagnosis Description L97.522 Non-pressure chronic ulcer of other part of left foot with fat layer exposed Modifier: Quantity: 1 Physician Procedures : CPT4 Code Description Modifier [id number] 11042 - WC PHYS SUBQ TISS 20 SQ CM ICD-10 Diagnosis Description L97.522 Non-pressure chronic ulcer of other part of left foot with fat layer exposed Quantity: 1 Electronic Signature(s) Signed: 06/21/2020 7:15:26 AM By: [name] Ham MD Previous Signature: 06/18/2020 4:56:28 PM Version By: [name] [name] Entered By: [name] [name] on 06/18/2020 17:21:30

## 2020-06-24 ENCOUNTER — Other Ambulatory Visit: Payer: Self-pay

## 2020-06-24 ENCOUNTER — Encounter: Payer: Self-pay | Admitting: Family Medicine

## 2020-06-24 ENCOUNTER — Ambulatory Visit (INDEPENDENT_AMBULATORY_CARE_PROVIDER_SITE_OTHER): Payer: PRIVATE HEALTH INSURANCE | Admitting: Family Medicine

## 2020-06-24 VITALS — BP 110/80 | HR 82 | Temp 98.6°F | Resp 18 | Ht 62.0 in

## 2020-06-24 DIAGNOSIS — I10 Essential (primary) hypertension: Secondary | ICD-10-CM

## 2020-06-24 DIAGNOSIS — Z794 Long term (current) use of insulin: Secondary | ICD-10-CM

## 2020-06-24 DIAGNOSIS — F418 Other specified anxiety disorders: Secondary | ICD-10-CM | POA: Diagnosis not present

## 2020-06-24 DIAGNOSIS — Z23 Encounter for immunization: Secondary | ICD-10-CM | POA: Diagnosis not present

## 2020-06-24 DIAGNOSIS — E785 Hyperlipidemia, unspecified: Secondary | ICD-10-CM

## 2020-06-24 DIAGNOSIS — N1832 Chronic kidney disease, stage 3b: Secondary | ICD-10-CM

## 2020-06-24 DIAGNOSIS — M48061 Spinal stenosis, lumbar region without neurogenic claudication: Secondary | ICD-10-CM

## 2020-06-24 DIAGNOSIS — E1165 Type 2 diabetes mellitus with hyperglycemia: Secondary | ICD-10-CM | POA: Diagnosis not present

## 2020-06-24 DIAGNOSIS — Z79899 Other long term (current) drug therapy: Secondary | ICD-10-CM

## 2020-06-24 MED ORDER — DIAZEPAM 5 MG PO TABS: 5.0000 mg | ORAL_TABLET | Freq: Two times a day (BID) | ORAL | 0 refills | Status: DC | PRN

## 2020-06-24 NOTE — Progress Notes (Signed)
Patient ID: [name], adult    DOB: Nov 29, 1955  Age: 64 y.o. MRN: [id number]    Subjective:  Subjective  HPI [name] [name] presents for f/u pain meds, chol and bp.  She needs f/u with endo but needs something closer to home   Review of Systems  Constitutional: Negative for appetite change, diaphoresis, fatigue and unexpected weight change.  Eyes: Negative for pain, redness and visual disturbance.  Respiratory: Negative for cough, chest tightness, shortness of breath and wheezing.   Cardiovascular: Negative for chest pain, palpitations and leg swelling.  Endocrine: Negative for cold intolerance, heat intolerance, polydipsia, polyphagia and polyuria.  Genitourinary: Negative for difficulty urinating, dysuria and frequency.  Neurological: Negative for dizziness, light-headedness, numbness and headaches.    History Past Medical History:  Diagnosis Date  . Acute MI [facility]) 2007   presented to ED & had cardiac cath- but found to have normal coronaries. Since that point in time her PCP cares f or cardiac needs. Dr. [name] - [facility]  . Anemia   . Anginal pain ([place])   . Anxiety   . Asthma   . Bulging lumbar disc   . Cataract   . 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 Cirrhosis of liver without mention of alcohol   . Constipation   . Dehiscence of closure of skin    left partial calcaneal excision  . Depression   . Diabetes mellitus    insulin dependent, adult onset  . Episode of visual loss of left eye   . Exertional shortness of breath   . Fatty liver   . Fibromyalgia   . GERD (gastroesophageal reflux disease)   . Hepatic steatosis   . High cholesterol   . Hypertension   . MRSA (methicillin resistant Staphylococcus aureus)   . Neuropathy    lower legs  . Osteoarthritis    hands, hips  . Proximal humerus fracture 10/15/12   Left  . PTSD (post-traumatic stress disorder)   . 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); Cardiac catheterization (2007); Skin graft split thickness leg / foot (Right, 03/19/2013); Cholecystectomy (1995); Abdominal hysterectomy (1979); Dilation and curettage of uterus (1977); I & D extremity (Right, 03/19/2013); I & D extremity (Left, 09/08/2016); I & D extremity (Left, 09/29/2016); Incision and drainage (Right, 04/17/2019); IR US Guide Vasc Access Right (04/21/2019); IR Fluoro Guide CV Line Right (04/21/2019); IR Fluoro Guide CV Line Right (08/28/2019); IR US Guide Vasc Access Right (08/28/2019); IR Fluoro Guide CV Line Right (11/04/2019); IR Removal Tun Cv Cath W/O FL (11/18/2019); Esophagogastroduodenoscopy (egd) with propofol (N/A, 02/18/2020); biopsy (02/18/2020); IR US Guide Vasc Access Right (02/25/2020); IR Fluoro Guide CV Line Right (02/25/2020); IR Removal Tun Cv Cath W/O FL (02/26/2020); Anterior lat lumbar fusion (N/A, 01/22/2020); Posterior lumbar fusion 4 level (N/A, 9/56/2130); and Application of robotic assistance for spinal procedure (N/A, 01/22/2020).   Her family history includes Cancer in her father; Colitis in her father; Crohn's disease in her father; Diabetes in her father and mother; Diabetes Mellitus I in her brother; Diabetes Mellitus II in her brother, brother, brother, and brother; Heart attack in her brother; Heart disease in her brother, brother, and father; Hypertension in her mother; Irritable bowel syndrome in her daughter; Kidney  disease in her brother, brother, brother, brother, and brother; Leukemia in her father; Liver disease in her brother and brother; Mental illness in her mother.She reports that she has never smoked. She has never used smokeless tobacco. She reports that she does not drink alcohol and does not use  drugs.  Current Outpatient Medications on File Prior to Visit  Medication Sig Dispense Refill  . acetaminophen (TYLENOL) 325 MG tablet Take 2 tablets (650 mg total) by mouth every 6 (six) hours as needed for fever.    [name] Kitchen atorvastatin (LIPITOR) 10 MG tablet Take 1 tablet by mouth once daily 90 tablet 1  . bethanechol (URECHOLINE) 10 MG tablet Take 1 tablet (10 mg total) by mouth 3 (three) times daily.    . ciprofloxacin (CIPRO) 500 MG tablet Take 1 tablet (500 mg total) by mouth daily. 30 tablet 5  . Continuous Blood Gluc Receiver (FREESTYLE LIBRE 14 DAY READER) DEVI 1 Device by Does not apply route daily. 2 each 6  . Continuous Blood Gluc Sensor (FREESTYLE LIBRE 14 DAY SENSOR) MISC 1 Device by Does not apply route daily. 2 each 6  . DULoxetine (CYMBALTA) 60 MG capsule Take 1 capsule (60 mg total) by mouth daily. 90 capsule 3  . folic acid (FOLVITE) 1 MG tablet Take 2 tablets (2 mg total) by mouth daily. 60 tablet 6  . HYDROcodone-acetaminophen (NORCO/VICODIN) 5-325 MG tablet Take 1 tablet by mouth every 6 (six) hours as needed for moderate pain. 120 tablet 0  . lidocaine (LIDODERM) 5 % Place 1 patch onto the skin daily. Remove & Discard patch within 12 hours or as directed by MD 30 patch 0  . lisinopril (ZESTRIL) 10 MG tablet Take 10 mg by mouth daily.    . metroNIDAZOLE (FLAGYL) 500 MG tablet Take 1 tablet (500 mg total) by mouth 3 (three) times daily. 90 tablet 1  . Multiple Vitamin (MULTIVITAMIN WITH MINERALS) TABS tablet Take 1 tablet by mouth daily.    [name] Kitchen oxyCODONE (OXY IR/ROXICODONE) 5 MG immediate release tablet Take 1 tablet (5 mg total) by mouth every 3 (three) hours as needed for moderate pain ((score 4 to 6)). 30 tablet 0  . pantoprazole (PROTONIX) 40 MG tablet Take 1 tablet (40 mg total) by mouth daily at 6 (six) AM.    . pregabalin (LYRICA) 75 MG capsule Take 1 capsule (75 mg total) by mouth 3 (three) times daily. 270 capsule 1  . Rivaroxaban (XARELTO) 15 MG TABS tablet Take 1 tablet  (15 mg total) by mouth daily with supper. 30 tablet 1  . [DISCONTINUED] metFORMIN (GLUCOPHAGE) 1000 MG tablet Take 1,000 mg by mouth 2 (two) times daily with a meal.      . [DISCONTINUED] omeprazole (PRILOSEC) 20 MG capsule Take 20 mg by mouth daily.       No current facility-administered medications on file prior to visit.     Objective:  Objective  Physical Exam Vitals and nursing note reviewed.  Constitutional:      Appearance: She is well-developed.  HENT:     Head: Normocephalic and atraumatic.  Eyes:     Conjunctiva/sclera: Conjunctivae normal.  Neck:     Thyroid: No thyromegaly.     Vascular: No carotid bruit or JVD.  Cardiovascular:     Rate and Rhythm: Normal rate and regular rhythm.     Heart sounds: Normal heart sounds. No murmur heard.   Pulmonary:     Effort: Pulmonary effort is normal. No respiratory distress.  Breath sounds: Normal breath sounds. No wheezing or rales.  Chest:     Chest wall: No tenderness.  Musculoskeletal:     Cervical back: Normal range of motion and neck supple.  Neurological:     Mental Status: She is alert and oriented to person, place, and time.    BP 110/80 (BP Location: Left Arm, Patient Position: Sitting, Cuff Size: Normal)   Pulse 82   Temp 98.6 F (37 C) (Oral)   Resp 18   Ht 5' 2"  (1.575 m)   SpO2 97%   BMI 32.19 kg/m  Wt Readings from Last 3 Encounters:  06/15/20 176 lb (79.8 kg)  02/26/20 187 lb 13.3 oz (85.2 kg)  02/18/20 190 lb 9.6 oz (86.5 kg)     Lab Results  Component Value Date   WBC 5.1 06/16/2020   HGB 9.9 (L) 06/16/2020   HCT 32.0 (L) 06/16/2020   PLT 172 06/16/2020   GLUCOSE 147 (H) 06/24/2020   CHOL 135 06/24/2020   TRIG 151 (H) 06/24/2020   HDL 31 (L) 06/24/2020   LDLDIRECT 115.8 01/23/2012   LDLCALC 79 06/24/2020   ALT 8 06/24/2020   AST 14 06/24/2020   NA 136 06/24/2020   K 5.7 (H) 06/24/2020   CL 105 06/24/2020   CREATININE 1.97 (H) 06/24/2020   BUN 33 (H) 06/24/2020   CO2 24  06/24/2020   TSH 1.65 01/03/2016   INR 1.2 01/22/2020   HGBA1C 8.1 (H) 06/24/2020   MICROALBUR 26.3 (H) 11/26/2018    DG Lumbar Spine Complete  Result Date: 05/13/2020 CLINICAL DATA:  Pain EXAM: LUMBAR SPINE - COMPLETE 4+ VIEW COMPARISON:  January 23, 2020 FINDINGS: The patient is status post prior multilevel thoracolumbar fusion. And L1 corpectomy cage is again noted. The hardware appears grossly intact where visualized. Multilevel degenerative changes are noted of the lumbar spine. There is osteopenia. There is no evidence for an acute compression fracture. Stable height loss is noted of the L2 vertebral body. The alignment is essentially stable from prior studies. IMPRESSION: No acute abnormality.  Chronic findings as detailed above. Electronically Signed   By: Constance Holster M.D.   On: 05/13/2020 16:33   DG HIP UNILAT WITH PELVIS 2-3 VIEWS LEFT  Result Date: 05/13/2020 CLINICAL DATA:  Left hip pain. EXAM: DG HIP (WITH OR WITHOUT PELVIS) 2-3V LEFT COMPARISON:  None. FINDINGS: There is no evidence of hip fracture or dislocation. There is no evidence of arthropathy or other focal bone abnormality. IMPRESSION: Negative. Electronically Signed   By: Constance Holster M.D.   On: 05/13/2020 16:26     Assessment & Plan:  Plan  I am having [name] [name] maintain her atorvastatin, lidocaine, acetaminophen, bethanechol, multivitamin with minerals, oxyCODONE, pantoprazole, FreeStyle Libre 14 Day Reader, FreeStyle Libre 14 Day Sensor, Rivaroxaban, DULoxetine, lisinopril, ciprofloxacin, metroNIDAZOLE, pregabalin, folic acid, HYDROcodone-acetaminophen, and diazepam.  Meds ordered this encounter  Medications  . diazepam (VALIUM) 5 MG tablet    Sig: Take 1 tablet (5 mg total) by mouth every 12 (twelve) hours as needed for anxiety.    Dispense:  30 tablet    Refill:  0    Problem List Items Addressed This Visit      Unprioritized   Essential hypertension    Well controlled, no changes to meds.  Encouraged heart healthy diet such as the DASH diet and exercise as tolerated.       Relevant Orders   Comprehensive metabolic panel (Completed)   Lipid panel (Completed)  Hyperlipidemia    Encouraged heart healthy diet, increase exercise, avoid trans fats, consider a krill oil cap daily       Other Visit Diagnoses    Depression with anxiety    -  Primary   Relevant Medications   diazepam (VALIUM) 5 MG tablet   Dyslipidemia       Relevant Orders   Comprehensive metabolic panel (Completed)   Lipid panel (Completed)   Uncontrolled type 2 diabetes mellitus with hyperglycemia ([place])       Relevant Orders   Ambulatory referral to Endocrinology   Hemoglobin A1c (Completed)   High risk medication use       Relevant Orders   Drug Tox Monitor 1 w/Conf, Oral Fld   Need for influenza vaccination       Relevant Orders   Flu Vaccine QUAD 36+ mos IM (Fluarix & Fluzone Quad PF (Completed)      Follow-up: Return in about 3 months (around 09/24/2020), or if symptoms worsen or fail to improve, for f/u pain meds.  Ann Held, DO

## 2020-06-24 NOTE — Patient Instructions (Signed)
Cholesterol Content in Foods Cholesterol is a waxy, fat-like substance that helps to carry fat in the blood. The body needs cholesterol in small amounts, but too much cholesterol can cause damage to the arteries and heart. Most people should eat less than 200 milligrams (mg) of cholesterol a day. Foods with cholesterol  Cholesterol is found in animal-based foods, such as meat, seafood, and dairy. Generally, low-fat dairy and lean meats have less cholesterol than full-fat dairy and fatty meats. The milligrams of cholesterol per serving (mg per serving) of common cholesterol-containing foods are listed below. Meat and other proteins  Egg -- one large whole egg has 186 mg.  Veal shank -- 4 oz has 141 mg.  Lean ground [place] (93% lean) -- 4 oz has 118 mg.  Fat-trimmed lamb loin -- 4 oz has 106 mg.  Lean ground beef (90% lean) -- 4 oz has 100 mg.  Lobster -- 3.5 oz has 90 mg.  Pork loin chops -- 4 oz has 86 mg.  Canned salmon -- 3.5 oz has 83 mg.  Fat-trimmed beef top loin -- 4 oz has 78 mg.  Frankfurter -- 1 frank (3.5 oz) has 77 mg.  Crab -- 3.5 oz has 71 mg.  Roasted chicken without skin, white meat -- 4 oz has 66 mg.  Light bologna -- 2 oz has 45 mg.  Deli-cut [place] -- 2 oz has 31 mg.  Canned tuna -- 3.5 oz has 31 mg.  Berniece Salines -- 1 oz has 29 mg.  Oysters and mussels (raw) -- 3.5 oz has 25 mg.  Mackerel -- 1 oz has 22 mg.  Trout -- 1 oz has 20 mg.  Pork sausage -- 1 link (1 oz) has 17 mg.  Salmon -- 1 oz has 16 mg.  Tilapia -- 1 oz has 14 mg. Dairy  Soft-serve ice cream --  cup (4 oz) has 103 mg.  Whole-milk yogurt -- 1 cup (8 oz) has 29 mg.  Cheddar cheese -- 1 oz has 28 mg.  American cheese -- 1 oz has 28 mg.  Whole milk -- 1 cup (8 oz) has 23 mg.  2% milk -- 1 cup (8 oz) has 18 mg.  Cream cheese -- 1 tablespoon (Tbsp) has 15 mg.  Cottage cheese --  cup (4 oz) has 14 mg.  Low-fat (1%) milk -- 1 cup (8 oz) has 10 mg.  Sour cream -- 1 Tbsp has 8.5  mg.  Low-fat yogurt -- 1 cup (8 oz) has 8 mg.  Nonfat Greek yogurt -- 1 cup (8 oz) has 7 mg.  Half-and-half cream -- 1 Tbsp has 5 mg. Fats and oils  Cod liver oil -- 1 tablespoon (Tbsp) has 82 mg.  Butter -- 1 Tbsp has 15 mg.  Lard -- 1 Tbsp has 14 mg.  Bacon grease -- 1 Tbsp has 14 mg.  Mayonnaise -- 1 Tbsp has 5-10 mg.  Margarine -- 1 Tbsp has 3-10 mg. Exact amounts of cholesterol in these foods may vary depending on specific ingredients and brands. Foods without cholesterol Most plant-based foods do not have cholesterol unless you combine them with a food that has cholesterol. Foods without cholesterol include:  Grains and cereals.  Vegetables.  Fruits.  Vegetable oils, such as olive, canola, and sunflower oil.  Legumes, such as peas, beans, and lentils.  Nuts and seeds.  Egg whites. Summary  The body needs cholesterol in small amounts, but too much cholesterol can cause damage to the arteries and heart.  Most people should eat less than 200 milligrams (mg) of cholesterol a day. This information is not intended to replace advice given to you by your health care provider. Make sure you discuss any questions you have with your health care provider. Document Revised: 09/28/2017 Document Reviewed: 06/12/2017 Elsevier Patient Education  [place].

## 2020-06-24 NOTE — Assessment & Plan Note (Signed)
Encouraged heart healthy diet, increase exercise, avoid trans fats, consider a krill oil cap daily 

## 2020-06-24 NOTE — Assessment & Plan Note (Signed)
Well controlled, no changes to meds. Encouraged heart healthy diet such as the DASH diet and exercise as tolerated.  °

## 2020-06-25 ENCOUNTER — Encounter (HOSPITAL_BASED_OUTPATIENT_CLINIC_OR_DEPARTMENT_OTHER): Payer: PRIVATE HEALTH INSURANCE | Admitting: Internal Medicine

## 2020-06-25 LAB — LIPID PANEL
Cholesterol: 135 mg/dL (ref <200)
HDL: 31 mg/dL — ABNORMAL LOW (ref >=50)
LDL Cholesterol (Calc): 79 mg/dL (calc)
Non-HDL Cholesterol (Calc): 104 mg/dL (calc) (ref <130)
Total CHOL/HDL Ratio: 4.4 (calc) (ref <5.0)
Triglycerides: 151 mg/dL — ABNORMAL HIGH (ref <150)

## 2020-06-25 LAB — COMPREHENSIVE METABOLIC PANEL
AG Ratio: 0.8 (calc) — ABNORMAL LOW (ref 1.0–2.5)
ALT: 8 U/L (ref 6–29)
AST: 14 U/L (ref 10–35)
Albumin: 3.4 g/dL — ABNORMAL LOW (ref 3.6–5.1)
Alkaline phosphatase (APISO): 78 U/L (ref 37–153)
BUN/Creatinine Ratio: 17 (calc) (ref 6–22)
BUN: 33 mg/dL — ABNORMAL HIGH (ref 7–25)
CO2: 24 mmol/L (ref 20–32)
Calcium: 8.6 mg/dL (ref 8.6–10.4)
Chloride: 105 mmol/L (ref 98–110)
Creat: 1.97 mg/dL — ABNORMAL HIGH (ref 0.50–0.99)
Globulin: 4.4 g/dL (calc) — ABNORMAL HIGH (ref 1.9–3.7)
Glucose, Bld: 147 mg/dL — ABNORMAL HIGH (ref 65–99)
Potassium: 5.7 mmol/L — ABNORMAL HIGH (ref 3.5–5.3)
Sodium: 136 mmol/L (ref 135–146)
Total Bilirubin: 0.4 mg/dL (ref 0.2–1.2)
Total Protein: 7.8 g/dL (ref 6.1–8.1)

## 2020-06-25 LAB — HEMOGLOBIN A1C
Hgb A1c MFr Bld: 8.1 % of total Hgb — ABNORMAL HIGH (ref <5.7)
Mean Plasma Glucose: 186 (calc)
eAG (mmol/L): 10.3 (calc)

## 2020-06-26 NOTE — Progress Notes (Signed)
SHEILLA, MARIS ([id number]) Visit Report for 06/18/2020 Arrival Information Details Patient Name: Date of Service: [name], [name] 06/18/2020 3:30 PM Medical Record Number: [id number] Patient Account Number: [id number] Date of Birth/Sex: Treating RN: December 08, 1955 (64 y.o. F) [name], [facility] Lillias Difrancesco: LO WNE CHA SE, YV O NNE Other Clinician: Referring Vera Wishart: Treating Izel Eisenhardt/Extender: Linton Ham LO WNE CHA SE, YV O NNE Weeks in Treatment: 1 Visit Information History Since Last Visit Added or deleted any medications: No Patient Arrived: Other Any new allergies or adverse reactions: No Arrival Time: 16:15 Had a fall or experienced change in No Accompanied By: self activities of daily living that may affect Transfer Assistance: None risk of falls: Patient Identification Verified: Yes Signs or symptoms of abuse/neglect since last visito No Secondary Verification Process Completed: Yes Hospitalized since last visit: No Implantable device outside of the clinic excluding No cellular tissue based products placed in the center since last visit: Has Dressing in Place as Prescribed: Yes Pain Present Now: Yes Electronic Signature(s) Signed: 06/21/2020 2:53:33 PM By: [name] Entered By: [name] on 06/18/2020 16:17:13 -------------------------------------------------------------------------------- Encounter Discharge Information Details Patient Name: Date of Service: [name], [name] 06/18/2020 3:30 PM Medical Record Number: [id number] Patient Account Number: [id number] Date of Birth/Sex: Treating RN: October 08, 1956 (64 y.o. Orvan Falconer Primary Care Esteen Delpriore: LO WNE CHA SE, YV O NNE Other Clinician: Referring Future Yeldell: Treating Sharmon Cheramie/Extender: Linton Ham LO WNE CHA SE, YV O NNE Weeks in Treatment: 1 Encounter Discharge Information Items Post Procedure Vitals Discharge Condition: Stable Temperature (F): 98.3 Ambulatory Status:  Wheelchair Pulse (bpm): 80 Discharge Destination: Home Respiratory Rate (breaths/min): 18 Transportation: Private Auto Blood Pressure (mmHg): 150/82 Accompanied By: self Schedule Follow-up Appointment: Yes Clinical Summary of Care: Patient Declined Electronic Signature(s) Signed: 06/25/2020 5:50:16 PM By: Carlene Coria RN Entered By: Carlene Coria on 06/18/2020 17:08:25 -------------------------------------------------------------------------------- Lower Extremity Assessment Details Patient Name: Date of Service: [name], [name] 06/18/2020 3:30 PM Medical Record Number: [id number] Patient Account Number: [id number] Date of Birth/Sex: Treating RN: 08-Jul-1956 (64 y.o. Orvan Falconer Primary Care Alizaya Oshea: LO WNE CHA SE, YV O NNE Other Clinician: Referring Davian Hanshaw: Treating Richardo Popoff/Extender: Linton Ham LO WNE CHA SE, YV O NNE Weeks in Treatment: 1 Edema Assessment Assessed: [Left: No] [Right: No] Edema: [Left: Ye] [Right: s] Calf Left: Right: Point of Measurement: 29 cm From Medial Instep 34 cm cm Ankle Left: Right: Point of Measurement: 9 cm From Medial Instep 22 cm cm Electronic Signature(s) Signed: 06/25/2020 5:50:16 PM By: Carlene Coria RN Entered By: Carlene Coria on 06/18/2020 16:30:47 -------------------------------------------------------------------------------- Multi Wound Chart Details Patient Name: Date of Service: [name], [name] 06/18/2020 3:30 PM Medical Record Number: [id number] Patient Account Number: [id number] Date of Birth/Sex: Treating RN: 02/20/1956 (64 y.o. F) [name], [facility] Primary Care Hunt Zajicek: LO WNE CHA SE, YV O NNE Other Clinician: Referring Kimi Bordeau: Treating Qianna Clagett/Extender: Linton Ham LO WNE CHA SE, YV O NNE Weeks in Treatment: 1 Vital Signs Height(in): 61 Pulse(bpm): 66 Weight(lbs): 170 Blood Pressure(mmHg): 150/82 Body Mass Index(BMI): 32 Temperature(F): 98.3 Respiratory Rate(breaths/min): 18 Photos: [7:No  Photos Left Calcaneus] [N/A:N/A N/A] Wound Location: [7:Gradually Appeared] [N/A:N/A] Wounding Event: [7:Diabetic Wound/Ulcer of the Lower] [N/A:N/A] Primary Etiology: [7:Extremity Cataracts, Anemia, Sleep Apnea,] [N/A:N/A] Comorbid History: [7:Coronary Artery Disease, Hypertension, Myocardial Infarction, Type II Diabetes, Osteoarthritis, Osteomyelitis, Neuropathy 03/30/2020] [N/A:N/A] Date Acquired: [7:1] [N/A:N/A] Weeks of Treatment: [7:Open] [N/A:N/A] Wound Status: [7:2.1x1.5x0.2] [N/A:N/A] Measurements L x W x D (cm) [7:2.474] [N/A:N/A]  A (cm) : rea [7:0.495] [N/A:N/A] Volume (cm) : [7:-105.80%] [N/A:N/A] % Reduction in Area: [7:31.30%] [N/A:N/A] % Reduction in Volume: [7:Grade 2] [N/A:N/A] Classification: [7:Medium] [N/A:N/A] Exudate A mount: [7:Serosanguineous] [N/A:N/A] Exudate Type: [7:red, brown] [N/A:N/A] Exudate Color: [7:Well defined, not attached] [N/A:N/A] Wound Margin: [7:Large (67-100%)] [N/A:N/A] Granulation A mount: [7:Pink] [N/A:N/A] Granulation Quality: [7:Small (1-33%)] [N/A:N/A] Necrotic A mount: [7:Fat Layer (Subcutaneous Tissue): Yes N/A] Exposed Structures: [7:Fascia: No Tendon: No Muscle: No Joint: No Bone: No None] [N/A:N/A] Epithelialization: [7:Debridement - Excisional] [N/A:N/A] Debridement: Pre-procedure Verification/Time Out 16:42 [N/A:N/A] Taken: [7:Other] [N/A:N/A] Pain Control: [7:Subcutaneous] [N/A:N/A] Tissue Debrided: [7:Skin/Subcutaneous Tissue] [N/A:N/A] Level: [7:3.15] [N/A:N/A] Debridement A (sq cm): [7:rea Curette] [N/A:N/A] Instrument: [7:Moderate] [N/A:N/A] Bleeding: [7:Silver Nitrate] [N/A:N/A] Hemostasis A chieved: [7:0] [N/A:N/A] Procedural Pain: [7:0] [N/A:N/A] Post Procedural Pain: [7:Procedure was tolerated well] [N/A:N/A] Debridement Treatment Response: [7:2.1x1.5x0.2] [N/A:N/A] Post Debridement Measurements L x W x D (cm) [7:0.495] [N/A:N/A] Post Debridement Volume: (cm) [7:Debridement] [N/A:N/A] Treatment Notes Wound #7  (Left Calcaneus) 1. Cleanse With Other cleanser (specify in notes) 3. Primary Dressing Applied Hydrofera Blue 4. Secondary Dressing Dry Gauze Roll Gauze 5. Secured With Tape 7. Footwear/Offloading device applied Other footwear/offloading device (specify in notes) Notes offloader Electronic Signature(s) Signed: 06/18/2020 5:19:06 PM By: Kela Millin Signed: 06/21/2020 7:15:26 AM By: Linton Ham MD Entered By: Linton Ham on 06/18/2020 17:07:22 -------------------------------------------------------------------------------- Multi-Disciplinary Care Plan Details Patient Name: Date of Service: [name], [name] 06/18/2020 3:30 PM Medical Record Number: [id number] Patient Account Number: [id number] Date of Birth/Sex: Treating RN: 1956-07-06 (64 y.o. F) [name], [facility] Primary Care Shlomie Romig: LO WNE CHA SE, YV O NNE Other Clinician: Referring Jaylan Duggar: Treating Terry Bolotin/Extender: Linton Ham LO WNE CHA SE, YV O NNE Weeks in Treatment: 1 Active Inactive Abuse / Safety / Falls / Self Care Management Nursing Diagnoses: Potential for falls Goals: Patient/caregiver will verbalize/demonstrate measures taken to prevent injury and/or falls Date Initiated: 06/09/2020 Target Resolution Date: 07/07/2020 Goal Status: Active Interventions: Assess: immobility, friction, shearing, incontinence upon admission and as needed Assess impairment of mobility on admission and as needed per policy Notes: Nutrition Nursing Diagnoses: Impaired glucose control: actual or potential Potential for alteratiion in Nutrition/Potential for imbalanced nutrition Goals: Patient/caregiver verbalizes understanding of need to maintain therapeutic glucose control per primary care physician Date Initiated: 06/09/2020 Target Resolution Date: 07/07/2020 Goal Status: Active Patient/caregiver will maintain therapeutic glucose control Date Initiated: 06/09/2020 Target Resolution Date: 07/07/2020 Goal  Status: Active Interventions: Provide education on elevated blood sugars and impact on wound healing Treatment Activities: Patient referred to Primary Care Physician for further nutritional evaluation : 06/09/2020 Notes: Wound/Skin Impairment Nursing Diagnoses: Impaired tissue integrity Knowledge deficit related to ulceration/compromised skin integrity Goals: Patient/caregiver will verbalize understanding of skin care regimen Date Initiated: 06/09/2020 Target Resolution Date: 07/07/2020 Goal Status: Active Ulcer/skin breakdown will have a volume reduction of 30% by week 4 Date Initiated: 06/09/2020 Target Resolution Date: 07/07/2020 Goal Status: Active Interventions: Assess patient/caregiver ability to obtain necessary supplies Assess patient/caregiver ability to perform ulcer/skin care regimen upon admission and as needed Assess ulceration(s) every visit Provide education on ulcer and skin care Treatment Activities: Skin care regimen initiated : 06/09/2020 Topical wound management initiated : 06/09/2020 Notes: Electronic Signature(s) Signed: 06/18/2020 4:56:28 PM By: Kela Millin Entered By: Kela Millin on 06/18/2020 16:41:47 -------------------------------------------------------------------------------- Pain Assessment Details Patient Name: Date of Service: [name], [name] 06/18/2020 3:30 PM Medical Record Number: [id number] Patient Account Number: [id number] Date of Birth/Sex: Treating RN: Jun 01, 1956 (64 y.o. Clearnce Sorrel Primary Care Aynsley Fleet:  LO WNE CHA SE, YV O NNE Other Clinician: Referring Trenisha Lafavor: Treating Cookie Pore/Extender: Linton Ham LO WNE CHA SE, YV O NNE Weeks in Treatment: 1 Active Problems Location of Pain Severity and Description of Pain Patient Has Paino Yes Site Locations Rate the pain. Current Pain Level: 6 Pain Management and Medication Current Pain Management: Electronic Signature(s) Signed: 06/18/2020 4:56:28 PM By:  Kela Millin Signed: 06/21/2020 2:53:33 PM By: [name] Entered By: [name] on 06/18/2020 16:17:36 -------------------------------------------------------------------------------- Patient/Caregiver Education Details Patient Name: Date of Service: [name], [name] 8/20/2021andnbsp3:30 PM Medical Record Number: [id number] Patient Account Number: [id number] Date of Birth/Gender: Treating RN: Jul 20, 1956 (64 y.o. F) Kela Millin Primary Care Physician: LO WNE CHA SE, YV O NNE Other Clinician: Referring Physician: Treating Physician/Extender: Linton Ham LO WNE CHA SE, YV O NNE Weeks in Treatment: 1 Education Assessment Education Provided To: Patient Education Topics Provided Elevated Blood Sugar/ Impact on Healing: Handouts: Elevated Blood Sugars: How Do They Affect Wound Healing Methods: Explain/Verbal Responses: State content correctly Wound/Skin Impairment: Handouts: Caring for Your Ulcer Methods: Explain/Verbal Responses: State content correctly Electronic Signature(s) Signed: 06/18/2020 4:56:28 PM By: Kela Millin Entered By: Kela Millin on 06/18/2020 16:42:05 -------------------------------------------------------------------------------- Wound Assessment Details Patient Name: Date of Service: [name], [name] 06/18/2020 3:30 PM Medical Record Number: [id number] Patient Account Number: [id number] Date of Birth/Sex: Treating RN: 08-28-1956 (64 y.o. F) [name], [facility] Primary Care Eyden Dobie: LO WNE CHA SE, YV O NNE Other Clinician: Referring Tameka Hoiland: Treating Marithza Malachi/Extender: Linton Ham LO WNE CHA SE, YV O NNE Weeks in Treatment: 1 Wound Status Wound Number: 7 Primary Diabetic Wound/Ulcer of the Lower Extremity Etiology: Wound Location: Left Calcaneus Wound Open Wounding Event: Gradually Appeared Status: Date Acquired: 03/30/2020 Comorbid Cataracts, Anemia, Sleep Apnea, Coronary Artery Disease, Weeks Of  Treatment: 1 History: Hypertension, Myocardial Infarction, Type II Diabetes, Clustered Wound: No Osteoarthritis, Osteomyelitis, Neuropathy Photos Photo Uploaded By: Mikeal Hawthorne on 06/22/2020 15:20:03 Wound Measurements Length: (cm) 2.1 Width: (cm) 1.5 Depth: (cm) 0.2 Area: (cm) 2.474 Volume: (cm) 0.495 % Reduction in Area: -105.8% % Reduction in Volume: 31.3% Epithelialization: None Tunneling: No Undermining: No Wound Description Classification: Grade 2 Wound Margin: Well defined, not attached Exudate Amount: Medium Exudate Type: Serosanguineous Exudate Color: red, brown Foul Odor After Cleansing: No Slough/Fibrino Yes Wound Bed Granulation Amount: Large (67-100%) Exposed Structure Granulation Quality: Pink Fascia Exposed: No Necrotic Amount: Small (1-33%) Fat Layer (Subcutaneous Tissue) Exposed: Yes Necrotic Quality: Adherent Slough Tendon Exposed: No Muscle Exposed: No Joint Exposed: No Bone Exposed: No Treatment Notes Wound #7 (Left Calcaneus) 1. Cleanse With Other cleanser (specify in notes) 3. Primary Dressing Applied Hydrofera Blue 4. Secondary Dressing Dry Gauze Roll Gauze 5. Secured With Tape 7. Footwear/Offloading device applied Other footwear/offloading device (specify in notes) Notes offloader Electronic Signature(s) Signed: 06/18/2020 4:56:28 PM By: Kela Millin Signed: 06/25/2020 5:50:16 PM By: Carlene Coria RN Entered By: Carlene Coria on 06/18/2020 16:31:08 -------------------------------------------------------------------------------- Vitals Details Patient Name: Date of Service: [name], [name] 06/18/2020 3:30 PM Medical Record Number: [id number] Patient Account Number: [id number] Date of Birth/Sex: Treating RN: [birth year] (64 y.o. F) [name], [facility] Primary Care Amel Gianino: LO WNE CHA SE, YV O NNE Other Clinician: Referring Kerrigan Gombos: Treating Leialoha Hanna/Extender: Linton Ham LO WNE CHA SE, YV O NNE Weeks in Treatment:  1 Vital Signs Time Taken: 16:17 Temperature (F): 98.3 Height (in): 61 Pulse (bpm): 80 Weight (lbs): 170 Respiratory Rate (breaths/min): 18 Body Mass Index (BMI): 32.1 Blood Pressure (mmHg): 150/82 Reference Range: 80 -  120 mg / dl Electronic Signature(s) Signed: 06/21/2020 2:53:33 PM By: [name] Entered By: [name] on 06/18/2020 16:17:29

## 2020-06-27 NOTE — Assessment & Plan Note (Signed)
Per endo Pt would like to transfer to [facility] office

## 2020-06-27 NOTE — Assessment & Plan Note (Signed)
Stable uds and contract updated Database reviewed

## 2020-06-27 NOTE — Assessment & Plan Note (Signed)
Per nephrology 

## 2020-07-01 ENCOUNTER — Encounter (HOSPITAL_BASED_OUTPATIENT_CLINIC_OR_DEPARTMENT_OTHER): Payer: PRIVATE HEALTH INSURANCE | Attending: Internal Medicine | Admitting: Internal Medicine

## 2020-07-01 DIAGNOSIS — I10 Essential (primary) hypertension: Secondary | ICD-10-CM | POA: Insufficient documentation

## 2020-07-01 DIAGNOSIS — E11621 Type 2 diabetes mellitus with foot ulcer: Secondary | ICD-10-CM | POA: Insufficient documentation

## 2020-07-01 DIAGNOSIS — Z8614 Personal history of Methicillin resistant Staphylococcus aureus infection: Secondary | ICD-10-CM | POA: Insufficient documentation

## 2020-07-01 DIAGNOSIS — M797 Fibromyalgia: Secondary | ICD-10-CM | POA: Insufficient documentation

## 2020-07-01 DIAGNOSIS — E1142 Type 2 diabetes mellitus with diabetic polyneuropathy: Secondary | ICD-10-CM | POA: Insufficient documentation

## 2020-07-01 DIAGNOSIS — L97522 Non-pressure chronic ulcer of other part of left foot with fat layer exposed: Secondary | ICD-10-CM | POA: Insufficient documentation

## 2020-07-01 NOTE — Progress Notes (Signed)
RYIN, SCHILLO ([id number]) Visit Report for 07/01/2020 Debridement Details Patient Name: Date of Service: [name] [name] RE, [name] 07/01/2020 3:15 PM Medical Record Number: [id number] Patient Account Number: [id number] Date of Birth/Sex: Treating RN: February 08, 1956 (64 y.o. [name] Primary Care Provider: LO WNE CHA SE, YV O NNE Other Clinician: Referring Provider: Treating Provider/Extender: Linton Ham LO WNE CHA SE, YV O NNE Weeks in Treatment: 3 Debridement Performed for Assessment: Wound #7 Left Calcaneus Performed By: Physician Ricard Dillon., MD Debridement Type: Debridement Severity of Tissue Pre Debridement: Fat layer exposed Level of Consciousness (Pre-procedure): Awake and Alert Pre-procedure Verification/Time Out Yes - 15:52 Taken: Start Time: 15:52 T Area Debrided (L x W): otal 2 (cm) x 1.2 (cm) = 2.4 (cm) Tissue and other material debrided: Viable, Non-Viable, Callus, Subcutaneous Level: Skin/Subcutaneous Tissue Debridement Description: Excisional Instrument: Curette Bleeding: Moderate Hemostasis Achieved: Silver Nitrate End Time: 15:53 Procedural Pain: 0 Post Procedural Pain: 0 Response to Treatment: Procedure was tolerated well Level of Consciousness (Post- Awake and Alert procedure): Post Debridement Measurements of Total Wound Length: (cm) 2 Width: (cm) 1.2 Depth: (cm) 0.2 Volume: (cm) 0.377 Character of Wound/Ulcer Post Debridement: Improved Severity of Tissue Post Debridement: Fat layer exposed Post Procedure Diagnosis Same as Pre-procedure Electronic Signature(s) Signed: 07/01/2020 5:08:15 PM By: [name] RN, BSN Signed: 07/01/2020 5:13:24 PM By: Linton Ham MD Entered By: Linton Ham on 07/01/2020 16:27:08 -------------------------------------------------------------------------------- HPI Details Patient Name: Date of Service: [name], [name] 07/01/2020 3:15 PM Medical Record Number: [id number] Patient Account Number:  [id number] Date of Birth/Sex: Treating RN: 30-Dec-1955 (63 y.o. [name] Primary Care Provider: LO WNE CHA SE, YV O NNE Other Clinician: Referring Provider: Treating Provider/Extender: Linton Ham LO WNE CHA SE, YV O NNE Weeks in Treatment: 3 History of Present Illness HPI Description: ADMISSION 11/12/2018 This is a 64 year old woman with type 2 diabetes and diabetic neuropathy. She has been dealing with a left heel plantar wound for roughly 2 years. She states this started when she pulled some skin off the area and it progressed into a wound. She also has an area on the tip of her left great toe for 1 year. She has been largely followed by Dr. Sharol Given and she had an excision of bone in the left heel in 2017. I do not see microbiology from this excision or pathology. Apparently this wound never really closed. She most recently has been using Silvadene cream. Offloading this with a scooter. The last MRI I see was in June 2018 which did not show osteomyelitis at that time. Apparently this wound is never really progressed towards healing. During her last review by Dr. Sharol Given in October it was recommended that she undergo a left BKA and she refused. She went to see a second orthopedic consult at [facility] who am recommended conservative wound care to see if this will close or progress towards closure but also warned that possible surgery may be necessary. An x-ray that was done at [facility] showed an irregular calcaneal body and calcaneal tuberosity. This is probably postprocedural. Previous x-rays at [facility] had suggested heterotrophic calcifications but I do not see this. Apparently a second ointment was added to the Silvadene which the patient thinks is because some improvement. She has not been systemically unwell. No fever or chills. She thinks the second ointment that was given to her at [facility] has helped somewhat. 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 ABIs done in June 2018 showed a ABI of 1.12 on the right and 1.1 on the left she was biphasic bilaterally. ABIs in our clinic were noncompressible today. 11/20/18 on evaluation today patient is seen for her second visit here in the office although this is actually the first visit with me concerning an issue that she's having with her great toe and heel of the left foot. Fortunately she does not appear to be have any discomfort at this time and she does have a scooter in order to offload her foot she also has a boot for offloading. With that being said this is something that appears to have been going on for some time she was seeing Dr. due to his recommendation was that she was going to require a below knee amputation. With that being said she wanted to come to the wound center but according to the patient Dr. Sharol Given told her that "we could not help her". Nonetheless she saw another provider who suggested that it may be worth a shot for [name] to try and help her out if it all possible. Nonetheless she decided that it would be worth a trial since otherwise any the way she's gonna end up with an amputation. Obviously if we can heal the wound then that will not be the case. Again I explained to her that obviously there are no guarantees but we will give this a good try and attempts to get the wound to heal. 1/30; the patient continues to have areas on the left plantar heel and the left plantar great toe. The more worrisome area is the heel. She went for MRI on Saturday but this could not be done because she had silver alginate in the wound bed. I would like to get this rebooked. If she does not have osteomyelitis she will need a total contact  cast. We have been using silver alginate in the wounds 2/7; patient has wounds on her left plantar heel and left plantar great toe. The area on the heel has some depth although it looks about the same today. Her MRI will finally be done tomorrow. If she has osteomyelitis in the heel and then we will need to consider her for IV antibiotics and hyperbaric oxygen. If the MRI is negative she will need a total contact cast although she is wearing a cam boot and using a scooter at present. Will be using silver alginate. She will take it off tomorrow in preparation for the MRI 2/14; the patient's MRI showed extensive surgical changes with a large portion of her calcaneus removed on the left secondary to her previous surgery by Dr. Sharol Given however there is no evidence of osteomyelitis. She would therefore be a candidate for a total contact cast and we applied this for the first time today 2/21; silver collagen total contact cast. The area on the plantar left great toe is "healed" still a lot of callus on this area. Dimensions on the plantar heel not too much different some epithelialization is present however. This is an improvement 12/25/18 on evaluation today patient actually is seen for follow-up concerning issues that she has been having with her cast she states she feels like it's wet and squishy in the bottom of her  cast. The reason she does come in to have this evaluated and see what is going on. Fortunately there's no signs of infection at this time was the cast was removed. 3/6; the patient's area on the tip of her right great toe is callused but there is no open area here. She still has the fairly extensive area in the left heel. We have been using silver collagen in the wound. 01/08/19 on evaluation today patient actually appears to be doing very well in regard to her heel ulcer in my pinion to see if you shown signs of improvement which is excellent news. There's no evidence of infection. She continues  to have quite a bit of drainage but fortunately again this doesn't seem to be hindering her healing. 3/20 -Patient's foot ulcer on the left appears to be doing well, the dimensions appear encouraging, we have been using total contact cast with Prisma and will continue doing that 3/27; patient continues to make nice progress on the left plantar heel. She is using silver collagen under a total contact cast. It is been a while since I have seen this wound and it really looks a lot better. Smaller with healthy granulation 4/3; she continues to make nice progress on the left plantar heel. Using silver collagen under a total contact cast 4/10; left plantar heel. Again skin over the surface of the wound with not much in the way of adherence. This results in undermining. Using silver collagen changed to silver alginate 4/17 left plantar heel. Again not much improvement. There is no undermining today no debridement was required we used silver alginate last time because of excessive moisture 4/24 left plantar heel. Again not as much improvement as I would have liked. About 3 mm of depth. Thick callused skin around the circumference. Using silver alginate 5/1; left plantar heel this is improved this week. Less depth epithelialization is present. Using silver alginate alginate under a total contact cast 5/8; left plantar heel dimensions are about the same. The depth appears to be improved. I use silver collagen starting today under the cast 5/15 left plantar heel. Arrives today with a 2-day history of feeling like something had "slipped" while walking her dog in the cast. Unfortunately extensive area relatively to the small wound of undermining superiorly denuded epithelium. 5/22-Patient returns at 1 week after being taken off the TCC on account of some fluid collection that was debrided and cultured at last visit from the plantar ulcer on the left heel. The culture results are polymicrobial with Klebsiella,  enterococcus, Proteus growth these organisms are sensitive to Cipro except with enterococcus which is sensitive to ampicillin but patient is highly allergic to penicillin according to her. 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 5/29; the patient arrived last week with a new wound on her left plantar great toe. With regards to the culture that I did 2 weeks ago of her deteriorating heel wound this grew Klebsiella and enterococcus. She was given Cipro however the enterococcus would not be covered well by a quinolone. She is allergic to penicillin. I will give her linezolid 600 twice daily x5 days 6/12; the patient continues to have a wound on her left and after she returns to the beach there is undermining laterally. She has the new wound from this 2 weeks or so ago on the plantar tip of her left great toe. She had a fall today  scraping the dorsal surface of the left fifth 7/14; READMISSION since the patient was last here she was hospitalized from 04/15/2019 through 04/22/2019. She had presented to an outside ER after falling and hitting her elbow. She had had previous surgery on the elbow and fractures several years ago. An x-ray was negative and she was sent home. She is readmitted with sepsis and acute renal failure secondary to septic arthritis of the elbow. Cultures apparently showed pansensitive staph aureus however she has a severe beta-lactam allergy. She was treated with vancomycin. Apparently the vancomycin is completed and her PICC line is removed although she is seeing Dr. Megan Salon tomorrow due to continued pain and swelling. In the hospital she had an IandD by Dr. [name] of orthopedics. The original surgery was on 04/17/2019 and it was felt that she had septic arthritis. She is still having a lot of pain and swelling in the right elbow and apparently is due to see Dr. Megan Salon tomorrow and what she  thinks is that she will be restarted on antibiotics. With regards to her heel wounds/left foot wounds. Her arterial studies were checked and her ABIs were within normal limits. X-ray showed plantar foot ulcer negative for osteomyelitis postop resection of the posterior calcaneus. She has been using silver alginate to the heel 8/4-Patient returns after being seen on 7/14, we are using silver alginate to the calcaneal wound, she is continuing to receive care for her right elbow septic arthritis 8/14- Patient returns after 1 week, she is being seen by the surgical group for her right elbow, she is here for the left calcaneal wound for which we are using silver alginate this is about the same 8/21; this is a patient I readmitted to the clinic 5 weeks ago. She is continuing to have difficulties with a septic arthritis of the right elbow she had after a fall and apparently has had surgical IandD's since the last time we saw her. She is also following with Dr. Megan Salon of infectious disease and is apparently on 3 oral antibiotics although at the time of this dictation I am not sure what they are. She comes in today with a necrotic surface on the left great toe with a blister laterally. This was still clearly an open wound. She had purulent drainage coming out of this. The original wound on the plantar aspect of her right heel in the setting of a Charcot foot is deep not open to bone but certainly not any better at all. We have been using silver alginate 8/28; dealing with septic arthritis of the right elbow. May need to go for further surgery here in the second week of September. She had a blister on the left great toe that was purulent [place] draining last week although a culture did not grow anything [already on antibiotics through infectious disease for her elbow]. The punched-out area on her heel is just like it was when she first came in. This almost closed with a total contact cast there are no options  for that now. The patient states she cannot stay off her foot having to do housework X-ray of the foot showed a moderate bunion and severe degenerative changes at the first metatarsal phalangeal joint there was a moderate plantar calcaneal spur. We will need to check the location of this. 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 She is apparently on a 3 times daily medication for her MRSA I will need to see  what that is. It is oral. We are using silver collagen to the punched-out area on her left heel and to the summer superficial area of the left great toe. She is offloading this is best she can although judging by the amount of callus it is not enough. She thinks she has enough strength in her right elbow now to use her scooter. 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 Hale Bogus notes and infectious disease. We are using silver collagen to the punched-out areas on her left heel and to the plantar aspect of her left great toe she is now using her scooter to get around and to help offload these areas 10/2; 2-week follow-up. Still on clindamycin I believe for the left elbow she is going for an MRI of the elbow over the weekend. She is then going to see orthopedics and infectious disease. The area on the left heel is certainly no better. This does not probe to bone however there is green drainage. She has an area on the tip of her toe. The area on the heel is in a wound we almost closed at one point with total contact casting. 10/9; one-week follow-up. Culture I did of the heel last week which was a swab culture admittedly showed moderate Enterococcus faecalis moderate Pseudomonas. The wound itself looks about the same. There is no palpable bone although the depth of it  closely approximates bone. The heel is swollen but not erythematous. Her MRI is booked for next week. She also has an MRI of the right elbow. I've also lifted the last consult from Dr. Megan Salon who is following her for septic arthritis of the right elbow. I did not see him specifically comment on her left heel however the patient states that he is aware of this. I did not specifically address the organisms I cultured last week because of the possible effect of any additional cultures that will be done on the elbow. Additionally these were superficial not bone cultures 10/16; her MRI of the heel was put back to next week. She did have her elbow done. I have been in contact with Dr. Megan Salon about my concerns about osteomyelitis of the left heel. We have been using collagen. She also has a wound on the plantar tip of her first toe 10/23; her MRI of the heel was negative for osteomyelitis but suggested a small abscess. She also had an MRI of her elbow that showed findings compatible with a septic joint. She went to see Dr. Megan Salon and she is going to have both oral and IV antibiotics which I am sure will cover any infection in the heel. I did not actually see his note today. We are using silver collagen offloading the heel with a heel offloading boot 11/6; patient continues with silver collagen to both wound areas on her plantar left heel and plantar left great toe. She remains on daptomycin by Dr. Megan Salon of infectious disease. She complains of diarrhea. Dr. Megan Salon is aware of this. 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 she has been put on oral vancomycin I think a lot of high clinical suspicion for pseudomembranous colitis Because of the right  elbow there are no options to completely off her left foot beyond the heel offloading boot we have now. Fortunately  the left heel ulcer itself has remained static some improvement in the plantar left great toe 11/20; patient arrives for review of her left heel ulcer. I also note she has a difficult time with septic arthritis of the right elbow. She currently is on IV daptomycin which seems to have helped the drainage in her elbow [MSSA]. Because of high concern for pseudomembranous colitis she was also put on vancomycin orally and Flagyl orally. She was on Levaquin but that was discontinued because of the concern about C. difficile. She states her diarrhea is better. She arrives in clinic today with a large area of denuded skin on the lateral part of the heel. This had totally separated. We have been using silver alginate to the wound areas. She arrived in a scooter telling me she is offloading the foot is much as possible. 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 Dr. Megan Salon stopped the daptomycin and now has her on vancomycin 1000 mg IV daily. This is supposed to go onto 1/18. She is being referred to [place] for what sounds like a elbow replacement type operation for her MSSA septic arthritis. She arrives in clinic today with a blister on the medial part of the wound on her heel. She still has a lot of callus around the heel although the surface of the wound on the left heel looks somewhat better. READMISSION 03/19/2020 Mrs. Beth is a 64 year old woman we have followed for almost all of 2020 with a neuropathic wound on her left heel and the tip of her left great toe.. We she had previously had a partial calcanectomy by Dr. Sharol Given because of underlying osteomyelitis. She also had a history of MRSA even when we admitted her to the clinic. An MRI when she first came into our clinic did not show osteomyelitis. We put  her in a total contact cast and gradually this contracted to the point it was almost closed however in that same timeframe she had a fall. 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 We had to stop putting her in a cast partially because of infection and partially because she could not support herself with the elbow. Things went progressively downhill. When we last saw her at the end of December she had a large open wound on the left heel. She had had recurrent infections in the right elbow. It was felt that either the heel lift: Ice her elbow or vice versa. We never had MRSA I do not believe cultured in the left heel however. 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 She underwent an operative debridement and a T11-L3 fusion. An operative culture grew Pseudomonas. She was treated with cefepime and required readmission to the hospital with acute kidney failure requiring temporary dialysis. She was discharged to rehab I believe she signed herself out [place]. He has been following with her primary doctor and she comes in the clinic today with miraculously the left heel totally closed. She is followed up with Dr. Megan Salon of infectious disease he does not feel she has any evidence of infection in her elbow or her heel for that  matter and the heel is actually fully epithelialized. She is on ciprofloxacin 500 twice daily I think mostly directed at Pseudomonas. The patient is convinced that it was the treatment of the Pseudomonas in her back that ultimately led to closure of her heel and that certainly possible or could have been because she was nonambulatory in the hospital for 3 months according to her. She is still using her heel offloading boot and she says she is "learning to walk again" Readmission: 06/09/2020 patient presents today for reevaluation here in the clinic following a reopening of her heel ulcer.  She tells [name] that this actually occurred about 2 weeks after she was last seen in May of this year though it is now the beginning of August and she has not come back until now to see [name]. Nonetheless she does have an open wound there is been no x-rays at this time infectious disease has been monitoring him following this up until this point. They made the recommendation that she come to see [name]. Fortunately there is no signs of active infection systemically though she does tell me that she had Pseudomonas in the spine when she had her back surgery she is currently on antibiotics for that. 8/20; this is a patient who I saw 1 time in late May. Apparently shortly after she was here the area on her plantar left heel started to drain again and she had an open wound. She has been going to her primary physician and has been following with Dr. Megan Salon. 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 She is currently on 3 times a day prophylactic ciprofloxacin. She is admitted to the clinic last week for review of the wound on the left heel. Notable for the fact that she had a partial calcanectomy by Dr. Sharol Given 3 or 4 years ago. She says she is using her motorized wheelchair again as an off loader but she came in in regular shoes. 9/2; 2-week follow-up. Plantar left heel. In spite of debridement last week she had thick very adherent callus around the circumference of the wound and a nonviable gray surface. We have been using Hydrofera Blue. We gave her a heel offloading boot last week. She says she is off her foot except for she needs to walk her dog. Electronic Signature(s) Signed: 07/01/2020 5:13:24 PM By: Linton Ham MD Entered By: Linton Ham on 07/01/2020 16:30:45 -------------------------------------------------------------------------------- Physical Exam Details Patient Name: Date of Service: [name], [name]  [name] 07/01/2020 3:15 PM Medical Record Number: [id number] Patient Account Number: [id number] Date of Birth/Sex: Treating RN: Sep 14, 1956 (64 y.o. [name] Primary Care Provider: LO WNE CHA SE, YV O NNE Other Clinician: Referring Provider: Treating Provider/Extender: Linton Ham LO WNE CHA SE, YV O NNE Weeks in Treatment: 3 Constitutional Patient is hypertensive.. Pulse regular and within target range for patient.[name] Kitchen Respirations regular, non-labored and within target range.. Temperature is normal and within the target range for the patient.[name] Kitchen Appears in no distress. Notes Wound exam; plantar left heel. Still with thick areas of callus and thick skin around the wound margins. There is no way to epithelialize this in this condition. The surface of the wound has a often gray color. There is no surrounding erythema no purulence. Using a #5 curette extensive debridement of the circumference of this wound and the surface to healthier looking tissue and a better looking wound  edge. Hemostasis with silver nitrate and direct pressure Electronic Signature(s) Signed: 07/01/2020 5:13:24 PM By: Linton Ham MD Entered By: Linton Ham on 07/01/2020 16:32:02 -------------------------------------------------------------------------------- Physician Orders Details Patient Name: Date of Service: [name], [name] 07/01/2020 3:15 PM Medical Record Number: [id number] Patient Account Number: [id number] Date of Birth/Sex: Treating RN: 1956-09-06 (64 y.o. [name] Primary Care Provider: LO WNE CHA SE, YV O NNE Other Clinician: Referring Provider: Treating Provider/Extender: Linton Ham LO WNE CHA SE, YV O NNE Weeks in Treatment: 3 Verbal / Phone Orders: No Diagnosis Coding ICD-10 Coding Code Description E11.621 Type 2 diabetes mellitus with foot ulcer L97.522 Non-pressure chronic ulcer of other part of left foot with fat layer exposed E11.42 Type 2 diabetes mellitus with diabetic  polyneuropathy Follow-up Appointments Return Appointment in 2 weeks. Dressing Change Frequency Wound #7 Left Calcaneus Change Dressing every other day. Skin Barriers/Peri-Wound Care Moisturizing lotion - to foot with dressing changes Wound Cleansing Wound #7 Left Calcaneus May shower and wash wound with soap and water. Primary Wound Dressing Wound #7 Left Calcaneus Hydrofera Blue - ready Secondary Dressing Wound #7 Left Calcaneus Kerlix/Rolled Gauze Dry Gauze Off-Loading Other: - Heel offloader shoe Electronic Signature(s) Signed: 07/01/2020 5:08:15 PM By: [name] RN, BSN Signed: 07/01/2020 5:13:24 PM By: Linton Ham MD Entered By: [name] on 07/01/2020 15:58:16 -------------------------------------------------------------------------------- Problem List Details Patient Name: Date of Service: [name], [name] 07/01/2020 3:15 PM Medical Record Number: [id number] Patient Account Number: [id number] Date of Birth/Sex: Treating RN: 12/11/1955 (64 y.o. [name] Primary Care Provider: LO WNE CHA SE, YV O NNE Other Clinician: Referring Provider: Treating Provider/Extender: Linton Ham LO WNE CHA SE, YV O NNE Weeks in Treatment: 3 Active Problems ICD-10 Encounter Code Description Active Date MDM Diagnosis E11.621 Type 2 diabetes mellitus with foot ulcer 06/09/2020 No Yes L97.522 Non-pressure chronic ulcer of other part of left foot with fat layer exposed 06/09/2020 No Yes E11.42 Type 2 diabetes mellitus with diabetic polyneuropathy 06/09/2020 No Yes Inactive Problems Resolved Problems Electronic Signature(s) Signed: 07/01/2020 5:13:24 PM By: Linton Ham MD Entered By: Linton Ham on 07/01/2020 16:26:15 -------------------------------------------------------------------------------- Progress Note Details Patient Name: Date of Service: [name], [name] 07/01/2020 3:15 PM Medical Record Number: [id number] Patient Account Number:  [id number] Date of Birth/Sex: Treating RN: 1956-01-11 (64 y.o. [name] Primary Care Provider: LO WNE CHA SE, YV O NNE Other Clinician: Referring Provider: Treating Provider/Extender: Linton Ham LO WNE CHA SE, YV O NNE Weeks in Treatment: 3 Subjective History of Present Illness (HPI) ADMISSION 11/12/2018 This is a 64 year old woman with type 2 diabetes and diabetic neuropathy. She has been dealing with a left heel plantar wound for roughly 2 years. She states this started when she pulled some skin off the area and it progressed into a wound. She also has an area on the tip of her left great toe for 1 year. She has been largely followed by Dr. Sharol Given and she had an excision of bone in the left heel in 2017. I do not see microbiology from this excision or pathology. Apparently this wound never really closed. She most recently has been using Silvadene cream. Offloading this with a scooter. The last MRI I see was in June 2018 which did not show osteomyelitis at that time. Apparently this wound is never really progressed towards healing. During her last review by Dr. Sharol Given in October it was recommended that she undergo a left BKA and she refused. She  went to see a second orthopedic consult at [facility] who am recommended conservative wound care to see if this will close or progress towards closure but also warned that possible surgery may be necessary. An x-ray that was done at [facility] showed an irregular calcaneal body and calcaneal tuberosity. This is probably postprocedural. Previous x-rays at [facility] had suggested heterotrophic calcifications but I do not see this. Apparently a second ointment was added to the Silvadene which the patient thinks is because some improvement. She has not been systemically unwell. No fever or chills. She thinks the second ointment that was given to her at [facility] has helped somewhat. 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 ABIs done in June 2018 showed a ABI of 1.12 on the right and 1.1 on the left she was biphasic bilaterally. ABIs in our clinic were noncompressible today. 11/20/18 on evaluation today patient is seen for her second visit here in the office although this is actually the first visit with me concerning an issue that she's having with her great toe and heel of the left foot. Fortunately she does not appear to be have any discomfort at this time and she does have a scooter in order to offload her foot she also has a boot for offloading. With that being said this is something that appears to have been going on for some time she was seeing Dr. due to his recommendation was that she was going to require a below knee amputation. With that being said she wanted to come to the wound center but according to the patient Dr. Sharol Given told her that "we could not help her". Nonetheless she saw another provider who suggested that it may be worth a shot for [name] to try and help her out if it all possible. Nonetheless she decided that it would be worth a trial since otherwise any the way she's gonna end up with an amputation. Obviously if we can heal the wound then that will not be the case. Again I explained to her that obviously there are no guarantees but we will give this a good try and attempts to get the wound to heal. 1/30; the patient continues to have areas on the left plantar heel and the left plantar great toe. The more worrisome area is the heel. She went for MRI on Saturday but this could not be done because she had silver alginate in the wound bed. I would like to get this rebooked. If she does not have osteomyelitis she will need a total contact  cast. We have been using silver alginate in the wounds 2/7; patient has wounds on her left plantar heel and left plantar great toe. The area on the heel has some depth although it looks about the same today. Her MRI will finally be done tomorrow. If she has osteomyelitis in the heel and then we will need to consider her for IV antibiotics and hyperbaric oxygen. If the MRI is negative she will need a total contact cast although she is wearing a cam boot and using a scooter at present. Will be using silver alginate. She will take it off tomorrow in preparation for the MRI 2/14; the patient's MRI  showed extensive surgical changes with a large portion of her calcaneus removed on the left secondary to her previous surgery by Dr. Sharol Given however there is no evidence of osteomyelitis. She would therefore be a candidate for a total contact cast and we applied this for the first time today 2/21; silver collagen total contact cast. The area on the plantar left great toe is "healed" still a lot of callus on this area. Dimensions on the plantar heel not too much different some epithelialization is present however. This is an improvement 12/25/18 on evaluation today patient actually is seen for follow-up concerning issues that she has been having with her cast she states she feels like it's wet and squishy in the bottom of her cast. The reason she does come in to have this evaluated and see what is going on. Fortunately there's no signs of infection at this time was the cast was removed. 3/6; the patient's area on the tip of her right great toe is callused but there is no open area here. She still has the fairly extensive area in the left heel. We have been using silver collagen in the wound. 01/08/19 on evaluation today patient actually appears to be doing very well in regard to her heel ulcer in my pinion to see if you shown signs of improvement which is excellent news. There's no evidence of infection. She continues  to have quite a bit of drainage but fortunately again this doesn't seem to be hindering her healing. 3/20 -Patient's foot ulcer on the left appears to be doing well, the dimensions appear encouraging, we have been using total contact cast with Prisma and will continue doing that 3/27; patient continues to make nice progress on the left plantar heel. She is using silver collagen under a total contact cast. It is been a while since I have seen this wound and it really looks a lot better. Smaller with healthy granulation 4/3; she continues to make nice progress on the left plantar heel. Using silver collagen under a total contact cast 4/10; left plantar heel. Again skin over the surface of the wound with not much in the way of adherence. This results in undermining. Using silver collagen changed to silver alginate 4/17 left plantar heel. Again not much improvement. There is no undermining today no debridement was required we used silver alginate last time because of excessive moisture 4/24 left plantar heel. Again not as much improvement as I would have liked. About 3 mm of depth. Thick callused skin around the circumference. Using silver alginate 5/1; left plantar heel this is improved this week. Less depth epithelialization is present. Using silver alginate alginate under a total contact cast 5/8; left plantar heel dimensions are about the same. The depth appears to be improved. I use silver collagen starting today under the cast 5/15 left plantar heel. Arrives today with a 2-day history of feeling like something had "slipped" while walking her dog in the cast. Unfortunately extensive area relatively to the small wound of undermining superiorly denuded epithelium. 5/22-Patient returns at 1 week after being taken off the TCC on account of some fluid collection that was debrided and cultured at last visit from the plantar ulcer on the left heel. The culture results are polymicrobial with Klebsiella,  enterococcus, Proteus growth these organisms are sensitive to Cipro except with enterococcus which is sensitive to ampicillin but patient is highly allergic to penicillin according to her. This wound appears larger and there is a new small skin depth  wound on the great toe plantar aspect patient does have hammertoes. 5/29; the patient arrived last week with a new wound on her left plantar great toe. With regards to the culture that I did 2 weeks ago of her deteriorating heel wound this grew Klebsiella and enterococcus. She was given Cipro however the enterococcus would not be covered well by a quinolone. She is allergic to penicillin. I will give her linezolid 600 twice daily x5 days 6/12; the patient continues to have a wound on her left and after she returns to the beach there is undermining laterally. She has the new wound from this 2 weeks or so ago on the plantar tip of her left great toe. She had a fall today scraping the dorsal surface of the left fifth 7/14; READMISSION since the patient was last here she was hospitalized from 04/15/2019 through 04/22/2019. She had presented to an outside ER after falling and hitting her elbow. She had had previous surgery on the elbow and fractures several years ago. An x-ray was negative and she was sent home. She is readmitted with sepsis and acute renal failure secondary to septic arthritis of the elbow. Cultures apparently showed pansensitive staph aureus however she has a severe beta-lactam allergy. She was treated with vancomycin. Apparently the vancomycin is completed and her PICC line is removed although she is seeing Dr. Megan Salon tomorrow due to continued pain and swelling. In the hospital she had an IandD by Dr. [name] of orthopedics. The original surgery was on 04/17/2019 and it was felt that she had septic arthritis. She is still having a lot of pain and swelling in the right elbow and apparently is due to see Dr. Megan Salon tomorrow and what she  thinks is that she will be restarted on antibiotics. With regards to her heel wounds/left foot wounds. Her arterial studies were checked and her ABIs were within normal limits. X-ray showed plantar foot ulcer negative for osteomyelitis postop resection of the posterior calcaneus. She has been using silver alginate to the heel 8/4-Patient returns after being seen on 7/14, we are using silver alginate to the calcaneal wound, she is continuing to receive care for her right elbow septic arthritis 8/14- Patient returns after 1 week, she is being seen by the surgical group for her right elbow, she is here for the left calcaneal wound for which we are using silver alginate this is about the same 8/21; this is a patient I readmitted to the clinic 5 weeks ago. She is continuing to have difficulties with a septic arthritis of the right elbow she had after a fall and apparently has had surgical IandD's since the last time we saw her. She is also following with Dr. Megan Salon of infectious disease and is apparently on 3 oral antibiotics although at the time of this dictation I am not sure what they are. She comes in today with a necrotic surface on the left great toe with a blister laterally. This was still clearly an open wound. She had purulent drainage coming out of this. The original wound on the plantar aspect of her right heel in the setting of a Charcot foot is deep not open to bone but certainly not any better at all. We have been using silver alginate 8/28; dealing with septic arthritis of the right elbow. May need to go for further surgery here in the second week of September. She had a blister on the left great toe that was purulent [place] draining last week although a  culture did not grow anything [already on antibiotics through infectious disease for her elbow]. The punched-out area on her heel is just like it was when she first came in. This almost closed with a total contact cast there are no options  for that now. The patient states she cannot stay off her foot having to do housework X-ray of the foot showed a moderate bunion and severe degenerative changes at the first metatarsal phalangeal joint there was a moderate plantar calcaneal spur. We will need to check the location of this. 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 It is oral. We are using silver collagen to the punched-out area on her left heel and to the summer superficial area of the left great toe. She is offloading this is best she can although judging by the amount of callus it is not enough. She thinks she has enough strength in her right elbow now to use her scooter. 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 Hale Bogus notes and infectious disease. We are using silver collagen to the punched-out areas on her left heel and to the plantar aspect of her left great toe she is now using her scooter to get around and to help offload these areas 10/2; 2-week follow-up. Still on clindamycin I believe for the left elbow she is going for an MRI of the elbow over the weekend. She is then going to see orthopedics and infectious disease. The area on the left heel is certainly no better. This does not probe to bone however there is green drainage. She has an area on the tip of her toe. The area on the heel is in a wound we almost closed at one point with total contact casting. 10/9; one-week follow-up. Culture I did of the heel last week which was a swab culture admittedly showed moderate Enterococcus faecalis moderate Pseudomonas. The wound itself looks about the same. There is no palpable bone although the depth of it  closely approximates bone. The heel is swollen but not erythematous. Her MRI is booked for next week. She also has an MRI of the right elbow. I've also lifted the last consult from Dr. Megan Salon who is following her for septic arthritis of the right elbow. I did not see him specifically comment on her left heel however the patient states that he is aware of this. I did not specifically address the organisms I cultured last week because of the possible effect of any additional cultures that will be done on the elbow. Additionally these were superficial not bone cultures 10/16; her MRI of the heel was put back to next week. She did have her elbow done. I have been in contact with Dr. Megan Salon about my concerns about osteomyelitis of the left heel. We have been using collagen. She also has a wound on the plantar tip of her first toe 10/23; her MRI of the heel was negative for osteomyelitis but suggested a small abscess. She also had an MRI of her elbow that showed findings compatible with a septic joint. She went to see Dr. Megan Salon and she is going to have both oral and IV  antibiotics which I am sure will cover any infection in the heel. I did not actually see his note today. We are using silver collagen offloading the heel with a heel offloading boot 11/6; patient continues with silver collagen to both wound areas on her plantar left heel and plantar left great toe. She remains on daptomycin by Dr. Megan Salon of infectious disease. She complains of diarrhea. Dr. Megan Salon is aware of this. 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 she has been put on oral vancomycin I think a lot of high clinical suspicion for pseudomembranous colitis Because of the right elbow there are no options to completely off her left foot beyond the heel offloading boot we have now. Fortunately  the left heel ulcer itself has remained static some improvement in the plantar left great toe 11/20; patient arrives for review of her left heel ulcer. I also note she has a difficult time with septic arthritis of the right elbow. She currently is on IV daptomycin which seems to have helped the drainage in her elbow [MSSA]. Because of high concern for pseudomembranous colitis she was also put on vancomycin orally and Flagyl orally. She was on Levaquin but that was discontinued because of the concern about C. difficile. She states her diarrhea is better. She arrives in clinic today with a large area of denuded skin on the lateral part of the heel. This had totally separated. We have been using silver alginate to the wound areas. She arrived in a scooter telling me she is offloading the foot is much as possible. 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 Dr. Megan Salon stopped the daptomycin and now has her on vancomycin 1000 mg IV daily. This is supposed to go onto 1/18. She is being referred to [facility] for what sounds like a elbow replacement type operation for her MSSA septic arthritis. She arrives in clinic today with a blister on the medial part of the wound on her heel. She still has a lot of callus around the heel although the surface of the wound on the left heel looks somewhat better. READMISSION 03/19/2020 Mrs. Youngren is a 64 year old woman we have followed for almost all of 2020 with a neuropathic wound on her left heel and the tip of her left great toe.. We she had previously had a partial calcanectomy by Dr. Sharol Given because of underlying osteomyelitis. She also had a history of MRSA even when we admitted her to the clinic. An MRI when she first came into our clinic did not show osteomyelitis. We put  her in a total contact cast and gradually this contracted to the point it was almost closed however in that same timeframe she had a fall. 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 We had to stop putting her in a cast partially because of infection and partially because she could not support herself with the elbow. Things went progressively downhill. When we last saw her at the end of December she had a large open wound on the left heel. She had had recurrent infections in the right elbow. It was felt that either the heel lift: Ice her elbow or vice versa. We never  had MRSA I do not believe cultured in the left heel however. 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 She underwent an operative debridement and a T11-L3 fusion. An operative culture grew Pseudomonas. She was treated with cefepime and required readmission to the hospital with acute kidney failure requiring temporary dialysis. She was discharged to rehab I believe she signed herself out [place]. He has been following with her primary doctor and she comes in the clinic today with miraculously the left heel totally closed. She is followed up with Dr. Megan Salon of infectious disease he does not feel she has any evidence of infection in her elbow or her heel for that matter and the heel is actually fully epithelialized. She is on ciprofloxacin 500 twice daily I think mostly directed at Pseudomonas. The patient is convinced that it was the treatment of the Pseudomonas in her back that ultimately led to closure of her heel and that certainly possible or could have been because she was nonambulatory in the hospital for 3 months according to her. She is still using her heel offloading boot and she says she is "learning to walk again" Readmission: 06/09/2020 patient presents today for reevaluation here in the clinic following a reopening of her heel ulcer.  She tells [name] that this actually occurred about 2 weeks after she was last seen in May of this year though it is now the beginning of August and she has not come back until now to see [name]. Nonetheless she does have an open wound there is been no x-rays at this time infectious disease has been monitoring him following this up until this point. They made the recommendation that she come to see [name]. Fortunately there is no signs of active infection systemically though she does tell me that she had Pseudomonas in the spine when she had her back surgery she is currently on antibiotics for that. 8/20; this is a patient who I saw 1 time in late May. Apparently shortly after she was here the area on her plantar left heel started to drain again and she had an open wound. She has been going to her primary physician and has been following with Dr. Megan Salon. 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 She is currently on 3 times a day prophylactic ciprofloxacin. She is admitted to the clinic last week for review of the wound on the left heel. Notable for the fact that she had a partial calcanectomy by Dr. Sharol Given 3 or 4 years ago. She says she is using her motorized wheelchair again as an off loader but she came in in regular shoes. 9/2; 2-week follow-up. Plantar left heel. In spite of debridement last week she had thick very adherent callus around the circumference of the wound and a nonviable gray surface. We have been using Hydrofera Blue. We gave her a heel offloading boot last week. She says she is off her foot except for she needs to walk her dog. Objective Constitutional Patient is hypertensive.. Pulse regular and within target range for patient.[name] Kitchen Respirations regular, non-labored and within target range.. Temperature is normal and within the target range for the patient.[name] Kitchen Appears in no distress. Vitals Time Taken:  3:24 PM, Height: 61 in, Source: Stated, Weight: 170 lbs, Source: Stated, BMI: 32.1, Temperature: 98.4 F, Pulse: 98 bpm, Respiratory Rate: 18  breaths/min, Blood Pressure: 151/81 mmHg, Capillary Blood Glucose: 121 mg/dl. General Notes: glucose per pt report this am General Notes: Wound exam; plantar left heel. Still with thick areas of callus and thick skin around the wound margins. There is no way to epithelialize this in this condition. The surface of the wound has a often gray color. There is no surrounding erythema no purulence. Using a #5 curette extensive debridement of the circumference of this wound and the surface to healthier looking tissue and a better looking wound edge. Hemostasis with silver nitrate and direct pressure Integumentary (Hair, Skin) Wound #7 status is Open. Original cause of wound was Gradually Appeared. The wound is located on the Left Calcaneus. The wound measures 2cm length x 1.2cm width x 0.2cm depth; 1.885cm^2 area and 0.377cm^3 volume. There is Fat Layer (Subcutaneous Tissue) exposed. There is no tunneling or undermining noted. There is a small amount of serosanguineous drainage noted. The wound margin is thickened. There is no granulation within the wound bed. There is a small (1-33%) amount of necrotic tissue within the wound bed including Adherent Slough. 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 There was a Excisional Skin/Subcutaneous Tissue Debridement with a total area of 2.4 sq cm performed by Ricard Dillon., MD. With the following instrument(s): Curette to remove Viable and Non-Viable  tissue/material. Material removed includes Callus and Subcutaneous Tissue and. No specimens were taken. A time out was conducted at 15:52, prior to the start of the procedure. A Moderate amount of bleeding was controlled with Silver Nitrate. The procedure was tolerated well with a pain level of 0 throughout and a pain level of 0 following the procedure. Post Debridement Measurements: 2cm length x 1.2cm width x 0.2cm depth; 0.377cm^3 volume. Character of Wound/Ulcer Post Debridement is improved. Severity of Tissue Post Debridement is: Fat layer exposed. Post procedure Diagnosis Wound #7: Same as Pre-Procedure Plan Follow-up Appointments: Return Appointment in 2 weeks. Dressing Change Frequency: Wound #7 Left Calcaneus: Change Dressing every other day. Skin Barriers/Peri-Wound Care: Moisturizing lotion - to foot with dressing changes Wound Cleansing: Wound #7 Left Calcaneus: May shower and wash wound with soap and water. Primary Wound Dressing: Wound #7 Left Calcaneus: Hydrofera Blue - ready Secondary Dressing: Wound #7 Left Calcaneus: Kerlix/Rolled Gauze Dry Gauze Off-Loading: Other: - Heel offloader shoe 1. I am continue with Hydrofera Blue/kerlix and gauze. Continue with the heel offloading boot 2. Cautioned about excessive time on this foot. She claims to be offloading this by using her motorized wheelchair etc. however the callus would dictate otherwise Electronic Signature(s) Signed: 07/01/2020 5:13:24 PM By: Linton Ham MD Entered By: Linton Ham on 07/01/2020 16:33:11 -------------------------------------------------------------------------------- SuperBill Details Patient Name: Date of Service: [name], [name] 07/01/2020 Medical Record Number: [id number] Patient Account Number: [id number] Date of Birth/Sex: Treating RN: 1956/07/22 (64 y.o. [name] Primary Care Provider: LO WNE CHA SE, YV O NNE Other Clinician: Referring Provider: Treating  Provider/Extender: Linton Ham LO WNE CHA SE, YV O NNE Weeks in Treatment: 3 Diagnosis Coding ICD-10 Codes Code Description E11.621 Type 2 diabetes mellitus with foot ulcer L97.522 Non-pressure chronic ulcer of other part of left foot with fat layer exposed E11.42  Type 2 diabetes mellitus with diabetic polyneuropathy Facility Procedures CPT4 Code: [id number] Description: 49702 - DEB SUBQ TISSUE 20 SQ CM/< ICD-10 Diagnosis Description L97.522 Non-pressure chronic ulcer of other part of left foot with fat layer exposed Modifier: Quantity: 1 Physician Procedures : CPT4 Code Description Modifier [id number] 50277 - WC PHYS SUBQ TISS 20 SQ CM ICD-10 Diagnosis Description L97.522 Non-pressure chronic ulcer of other part of left foot with fat layer exposed Quantity: 1 Electronic Signature(s) Signed: 07/01/2020 5:13:24 PM By: Linton Ham MD Entered By: Linton Ham on 07/01/2020 16:33:27

## 2020-07-07 NOTE — Progress Notes (Signed)
[name] [name], [name] [name] ([id number]) Visit Report for 07/01/2020 Arrival Information Details Patient Name: Date of Service: [name] [name] [name], [name] 07/01/2020 3:15 PM Medical Record Number: [id number] Patient Account Number: [id number] Date of Birth/Sex: Treating RN: 11-11-1955 (64 y.o. [name] Primary Care Joelene Barriere: LO WNE CHA SE, YV O NNE Other Clinician: Referring Rolf Fells: Treating Huntley Knoop/Extender: Linton Ham LO WNE CHA SE, YV O NNE Weeks in Treatment: 3 Visit Information History Since Last Visit Added or deleted any medications: No Patient Arrived: Gilford Rile Any new allergies or adverse reactions: No Arrival Time: 15:23 Had a fall or experienced change in No Accompanied By: self activities of daily living that may affect Transfer Assistance: None risk of falls: Patient Identification Verified: Yes Signs or symptoms of abuse/neglect since last visito No Secondary Verification Process Completed: Yes Hospitalized since last visit: No Patient Requires Transmission-Based Precautions: No Implantable device outside of the clinic excluding No Patient Has Alerts: No cellular tissue based products placed in the center since last visit: Has Dressing in Place as Prescribed: Yes Has Footwear/Offloading in Place as Prescribed: Yes Left: Other:heel offloading shoe Pain Present Now: Yes Electronic Signature(s) Signed: 07/02/2020 2:45:56 PM By: Baruch Gouty RN, BSN Entered By: Baruch Gouty on 07/01/2020 15:23:59 -------------------------------------------------------------------------------- Encounter Discharge Information Details Patient Name: Date of Service: [name] [name], [name] 07/01/2020 3:15 PM Medical Record Number: [id number] Patient Account Number: [id number] Date of Birth/Sex: Treating RN: 25-Feb-1956 (64 y.o. Orvan Falconer Primary Care Whitten Andreoni: LO WNE CHA SE, YV O NNE Other Clinician: Referring Aryianna Earwood: Treating Fawnda Vitullo/Extender: Linton Ham LO WNE CHA SE, YV O  NNE Weeks in Treatment: 3 Encounter Discharge Information Items Post Procedure Vitals Discharge Condition: Stable Temperature (F): 98.4 Ambulatory Status: Wheelchair Pulse (bpm): 98 Discharge Destination: Home Respiratory Rate (breaths/min): 18 Transportation: Private Auto Blood Pressure (mmHg): 151/81 Accompanied By: self Schedule Follow-up Appointment: Yes Clinical Summary of Care: Patient Declined Electronic Signature(s) Signed: 07/07/2020 5:14:09 PM By: Carlene Coria RN Entered By: Carlene Coria on 07/01/2020 16:20:23 -------------------------------------------------------------------------------- Lower Extremity Assessment Details Patient Name: Date of Service: [name] [name] [name] [name], [name] 07/01/2020 3:15 PM Medical Record Number: [id number] Patient Account Number: [id number] Date of Birth/Sex: Treating RN: 1956-01-16 (64 y.o. [name] Primary Care Salif Tay: LO WNE CHA SE, YV O NNE Other Clinician: Referring Aarthi Uyeno: Treating Quay Simkin/Extender: Linton Ham LO WNE CHA SE, YV O NNE Weeks in Treatment: 3 Edema Assessment Assessed: [Left: No] [Right: No] Edema: [Left: Ye] [Right: s] Calf Left: Right: Point of Measurement: 29 cm From Medial Instep 31.7 cm cm Ankle Left: Right: Point of Measurement: 9 cm From Medial Instep 20.5 cm cm Vascular Assessment Pulses: Dorsalis Pedis Palpable: [Left:Yes] Electronic Signature(s) Signed: 07/02/2020 2:45:56 PM By: Baruch Gouty RN, BSN Entered By: Baruch Gouty on 07/01/2020 15:28:49 -------------------------------------------------------------------------------- Multi Wound Chart Details Patient Name: Date of Service: [name] [name], [name] 07/01/2020 3:15 PM Medical Record Number: [id number] Patient Account Number: [id number] Date of Birth/Sex: Treating RN: 06-Mar-1956 (64 y.o. [name] [name] Primary Care Danne Vasek: LO WNE CHA SE, YV O NNE Other Clinician: Referring Mercer Peifer: Treating Kolson Chovanec/Extender: Linton Ham LO  WNE CHA SE, YV O NNE Weeks in Treatment: 3 Vital Signs Height(in): 61 Capillary Blood Glucose(mg/dl): 121 Weight(lbs): 170 Pulse(bpm): 28 Body Mass Index(BMI): 85 Blood Pressure(mmHg): 151/81 Temperature(F): 98.4 Respiratory Rate(breaths/min): 18 Photos: [7:No Photos Left Calcaneus] [N/A:N/A N/A] Wound Location: [7:Gradually Appeared] [N/A:N/A] Wounding Event: [7:Diabetic Wound/Ulcer of the Lower] [N/A:N/A] Primary Etiology: [7:Extremity Cataracts, Anemia, Sleep Apnea,] [N/A:N/A] Comorbid History: [7:Coronary Artery  Disease, Hypertension, Myocardial Infarction, Type II Diabetes, Osteoarthritis, Osteomyelitis, Neuropathy 03/30/2020] [N/A:N/A] Date Acquired: [7:3] [N/A:N/A] Weeks of Treatment: [7:Open] [N/A:N/A] Wound Status: [7:2x1.2x0.2] [N/A:N/A] Measurements L x W x D (cm) [7:1.885] [N/A:N/A] A (cm) : rea [7:0.377] [N/A:N/A] Volume (cm) : [7:-56.80%] [N/A:N/A] % Reduction in A rea: [7:47.70%] [N/A:N/A] % Reduction in Volume: [7:Grade 2] [N/A:N/A] Classification: [7:Small] [N/A:N/A] Exudate A mount: [7:Serosanguineous] [N/A:N/A] Exudate Type: [7:red, brown] [N/A:N/A] Exudate Color: [7:Thickened] [N/A:N/A] Wound Margin: [7:None Present (0%)] [N/A:N/A] Granulation A mount: [7:Small (1-33%)] [N/A:N/A] Necrotic A mount: [7:Fat Layer (Subcutaneous Tissue): Yes N/A] Exposed Structures: [7:Fascia: No Tendon: No Muscle: No Joint: No Bone: No None] [N/A:N/A] Epithelialization: [7:Debridement - Excisional] [N/A:N/A] Debridement: Pre-procedure Verification/Time Out 15:52 [N/A:N/A] Taken: [7:Callus, Subcutaneous] [N/A:N/A] Tissue Debrided: [7:Skin/Subcutaneous Tissue] [N/A:N/A] Level: [7:2.4] [N/A:N/A] Debridement A (sq cm): [7:rea Curette] [N/A:N/A] Instrument: [7:Moderate] [N/A:N/A] Bleeding: [7:Silver Nitrate] [N/A:N/A] Hemostasis A chieved: [7:0] [N/A:N/A] Procedural Pain: [7:0] [N/A:N/A] Post Procedural Pain: [7:Procedure was tolerated well] [N/A:N/A] Debridement Treatment  Response: [7:2x1.2x0.2] [N/A:N/A] Post Debridement Measurements L x W x D (cm) [7:0.377] [N/A:N/A] Post Debridement Volume: (cm) [7:Debridement] [N/A:N/A] Treatment Notes Wound #7 (Left Calcaneus) 1. Cleanse With Wound Cleanser 3. Primary Dressing Applied Hydrofera Blue 4. Secondary Dressing Dry Gauze Roll Gauze 5. Secured With Optometrist) Signed: 07/01/2020 5:08:15 PM By: [name] RN, BSN Signed: 07/01/2020 5:13:24 PM By: Linton Ham MD Entered By: Linton Ham on 07/01/2020 16:26:54 -------------------------------------------------------------------------------- Multi-Disciplinary Care Plan Details Patient Name: Date of Service: [name] [name], [name] 07/01/2020 3:15 PM Medical Record Number: [id number] Patient Account Number: [id number] Date of Birth/Sex: Treating RN: Jun 22, 1956 (64 y.o. [name] [name] Primary Care Jaki Hammerschmidt: LO WNE CHA SE, YV O NNE Other Clinician: Referring Charmayne Odell: Treating Rogena Deupree/Extender: Linton Ham LO WNE CHA SE, YV O NNE Weeks in Treatment: 3 Active Inactive Abuse / Safety / Falls / Self Care Management Nursing Diagnoses: Potential for falls Goals: Patient/caregiver will verbalize/demonstrate measures taken to prevent injury and/or falls Date Initiated: 06/09/2020 Target Resolution Date: 07/07/2020 Goal Status: Active Interventions: Assess: immobility, friction, shearing, incontinence upon admission and as needed Assess impairment of mobility on admission and as needed per policy Notes: Nutrition Nursing Diagnoses: Impaired glucose control: actual or potential Potential for alteratiion in Nutrition/Potential for imbalanced nutrition Goals: Patient/caregiver verbalizes understanding of need to maintain therapeutic glucose control per primary care physician Date Initiated: 06/09/2020 Target Resolution Date: 07/07/2020 Goal Status: Active Patient/caregiver will maintain therapeutic glucose  control Date Initiated: 06/09/2020 Target Resolution Date: 07/07/2020 Goal Status: Active Interventions: Provide education on elevated blood sugars and impact on wound healing Treatment Activities: Patient referred to Primary Care Physician for further nutritional evaluation : 06/09/2020 Notes: Wound/Skin Impairment Nursing Diagnoses: Impaired tissue integrity Knowledge deficit related to ulceration/compromised skin integrity Goals: Patient/caregiver will verbalize understanding of skin care regimen Date Initiated: 06/09/2020 Target Resolution Date: 07/07/2020 Goal Status: Active Ulcer/skin breakdown will have a volume reduction of 30% by week 4 Date Initiated: 06/09/2020 Target Resolution Date: 07/07/2020 Goal Status: Active Interventions: Assess patient/caregiver ability to obtain necessary supplies Assess patient/caregiver ability to perform ulcer/skin care regimen upon admission and as needed Assess ulceration(s) every visit Provide education on ulcer and skin care Treatment Activities: Skin care regimen initiated : 06/09/2020 Topical wound management initiated : 06/09/2020 Notes: Electronic Signature(s) Signed: 07/01/2020 5:08:15 PM By: [name] RN, BSN Entered By: [name] on 07/01/2020 15:38:35 -------------------------------------------------------------------------------- Pain Assessment Details Patient Name: Date of Service: [name] [name], [name] 07/01/2020 3:15 PM Medical Record Number: [id number] Patient  Account Number: [id number] Date of Birth/Sex: Treating RN: 20-Aug-1956 (64 y.o. [name] Primary Care Lannie Heaps: LO WNE CHA SE, YV O NNE Other Clinician: Referring Dejha King: Treating Lundy Cozart/Extender: Linton Ham LO WNE CHA SE, YV O NNE Weeks in Treatment: 3 Active Problems Location of Pain Severity and Description of Pain Patient Has Paino Yes Site Locations Pain Location: Pain in Ulcers With Dressing Change: No Duration of the Pain. Constant  / Intermittento Constant Rate the pain. Current Pain Level: 6 Worst Pain Level: 10 Least Pain Level: 5 Character of Pain Describe the Pain: Aching, Burning, Throbbing Pain Management and Medication Current Pain Management: Medication: Yes Is the Current Pain Management Adequate: Adequate How does your wound impact your activities of daily livingo Sleep: Yes Bathing: No Appetite: No Relationship With Others: No Bladder Continence: No Emotions: Yes Bowel Continence: No Work: No Toileting: No Drive: No Dressing: No Hobbies: No Electronic Signature(s) Signed: 07/02/2020 2:45:56 PM By: Baruch Gouty RN, BSN Entered By: Baruch Gouty on 07/01/2020 15:25:50 -------------------------------------------------------------------------------- Patient/Caregiver Education Details Patient Name: Date of Service: [name] [name], [name] 9/2/2021andnbsp3:15 PM Medical Record Number: [id number] Patient Account Number: [id number] Date of Birth/Gender: Treating RN: 07-15-1956 (64 y.o. [name] [name] Primary Care Physician: LO WNE CHA SE, YV O NNE Other Clinician: Referring Physician: Treating Physician/Extender: Linton Ham LO WNE CHA SE, YV O NNE Weeks in Treatment: 3 Education Assessment Education Provided To: Patient Education Topics Provided Wound/Skin Impairment: Methods: Explain/Verbal Responses: State content correctly Electronic Signature(s) Signed: 07/01/2020 5:08:15 PM By: [name] RN, BSN Entered By: [name] on 07/01/2020 15:39:30 -------------------------------------------------------------------------------- Wound Assessment Details Patient Name: Date of Service: [name] [name], [name] 07/01/2020 3:15 PM Medical Record Number: [id number] Patient Account Number: [id number] Date of Birth/Sex: Treating RN: 03-28-1956 (64 y.o. F) Baruch Gouty Primary Care Telesia Ates: LO WNE CHA SE, YV O NNE Other Clinician: Referring Hue Frick: Treating Shewanda Sharpe/Extender:  Linton Ham LO WNE CHA SE, YV O NNE Weeks in Treatment: 3 Wound Status Wound Number: 7 Primary Diabetic Wound/Ulcer of the Lower Extremity Etiology: Wound Location: Left Calcaneus Wound Open Wounding Event: Gradually Appeared Status: Date Acquired: 03/30/2020 Comorbid Cataracts, Anemia, Sleep Apnea, Coronary Artery Disease, Weeks Of Treatment: 3 History: Hypertension, Myocardial Infarction, Type II Diabetes, Clustered Wound: No Osteoarthritis, Osteomyelitis, Neuropathy Photos Photo Uploaded By: Mikeal Hawthorne on 07/06/2020 13:49:22 Wound Measurements Length: (cm) 2 Width: (cm) 1.2 Depth: (cm) 0.2 Area: (cm) 1.885 Volume: (cm) 0.377 % Reduction in Area: -56.8% % Reduction in Volume: 47.7% Epithelialization: None Tunneling: No Undermining: No Wound Description Classification: Grade 2 Wound Margin: Thickened Exudate Amount: Medium Exudate Type: Serosanguineous Exudate Color: red, brown Foul Odor After Cleansing: No Slough/Fibrino Yes Wound Bed Granulation Amount: None Present (0%) Exposed Structure Necrotic Amount: Small (1-33%) Fascia Exposed: No Necrotic Quality: Adherent Slough Fat Layer (Subcutaneous Tissue) Exposed: Yes Tendon Exposed: No Muscle Exposed: No Joint Exposed: No Bone Exposed: No Treatment Notes Wound #7 (Left Calcaneus) 1. Cleanse With Wound Cleanser 3. Primary Dressing Applied Hydrofera Blue 4. Secondary Dressing Dry Gauze Roll Gauze 5. Secured With Optometrist) Signed: 07/01/2020 5:08:15 PM By: [name] RN, BSN Signed: 07/02/2020 2:45:56 PM By: Baruch Gouty RN, BSN Entered By: [name] on 07/01/2020 16:34:05 -------------------------------------------------------------------------------- [place] Details Patient Name: Date of Service: [name] [name], [name] 07/01/2020 3:15 PM Medical Record Number: [id number] Patient Account Number: [id number] Date of Birth/Sex: Treating RN: [birth year] (64  y.o. [name] Primary Care Reed Dady: LO WNE CHA SE, YV [name] [name]  NNE Other Clinician: Referring Makinzee Durley: Treating Hassel Uphoff/Extender: Linton Ham LO WNE CHA SE, YV O NNE Weeks in Treatment: 3 Vital Signs Time Taken: 15:24 Temperature (F): 98.4 Height (in): 61 Pulse (bpm): 98 Source: Stated Respiratory Rate (breaths/min): 18 Weight (lbs): 170 Blood Pressure (mmHg): 151/81 Source: Stated Capillary Blood Glucose (mg/dl): 121 Body Mass Index (BMI): 32.1 Reference Range: 80 - 120 mg / dl Notes glucose per pt report this am Electronic Signature(s) Signed: 07/02/2020 2:45:56 PM By: Baruch Gouty RN, BSN Entered By: Baruch Gouty on 07/01/2020 15:24:54

## 2020-07-12 ENCOUNTER — Ambulatory Visit (HOSPITAL_BASED_OUTPATIENT_CLINIC_OR_DEPARTMENT_OTHER): Admission: RE | Admit: 2020-07-12 | Payer: PRIVATE HEALTH INSURANCE | Source: Ambulatory Visit

## 2020-07-15 ENCOUNTER — Ambulatory Visit (HOSPITAL_BASED_OUTPATIENT_CLINIC_OR_DEPARTMENT_OTHER)
Admission: RE | Admit: 2020-07-15 | Discharge: 2020-07-15 | Disposition: A | Discharge status: Home / Self Care | Payer: PRIVATE HEALTH INSURANCE | Source: Ambulatory Visit | Attending: Family | Admitting: Family

## 2020-07-15 ENCOUNTER — Other Ambulatory Visit: Payer: Self-pay

## 2020-07-15 DIAGNOSIS — D6859 Other primary thrombophilia: Secondary | ICD-10-CM | POA: Diagnosis present

## 2020-07-16 ENCOUNTER — Telehealth: Payer: Self-pay | Admitting: *Deleted

## 2020-07-16 ENCOUNTER — Encounter (HOSPITAL_BASED_OUTPATIENT_CLINIC_OR_DEPARTMENT_OTHER): Payer: PRIVATE HEALTH INSURANCE | Admitting: Internal Medicine

## 2020-07-16 DIAGNOSIS — E11621 Type 2 diabetes mellitus with foot ulcer: Secondary | ICD-10-CM | POA: Diagnosis not present

## 2020-07-16 NOTE — Telephone Encounter (Signed)
As noted below by Laverna Peace, NP, I left a message informing her that she doesn't have a DVT. Instructed her to call if she had any questions or concerns.

## 2020-07-16 NOTE — Telephone Encounter (Signed)
-----   Message from Eliezer Bottom, NP sent at 07/16/2020 12:02 PM EDT ----- Phebe Colla!!! No DVT!!!   ----- Message ----- From: Interface, Rad Results In Sent: 07/15/2020   2:59 PM EDT To: Eliezer Bottom, NP

## 2020-07-19 ENCOUNTER — Other Ambulatory Visit: Payer: Self-pay | Admitting: Family Medicine

## 2020-07-19 DIAGNOSIS — M8610 Other acute osteomyelitis, unspecified site: Secondary | ICD-10-CM

## 2020-07-19 DIAGNOSIS — Z981 Arthrodesis status: Secondary | ICD-10-CM

## 2020-07-19 DIAGNOSIS — G894 Chronic pain syndrome: Secondary | ICD-10-CM

## 2020-07-19 MED ORDER — HYDROCODONE-ACETAMINOPHEN 5-325 MG PO TABS: 1.0000 | ORAL_TABLET | Freq: Four times a day (QID) | ORAL | 0 refills | Status: DC | PRN

## 2020-07-19 NOTE — Telephone Encounter (Signed)
Medication: HYDROcodone-acetaminophen (NORCO/VICODIN) 5-325 MG tablet [[id number]   Has the patient contacted their pharmacy? No. (If no, request that the patient contact the pharmacy for the refill.) (If yes, when and what did the pharmacy advise?)  Preferred Pharmacy (with phone number or street name): [facility] [address] [place], [place] - [address]  [address], [place] [address]  Phone:  [phone] Fax:  [phone]  DEA #:  --  Agent: Please be advised that RX refills may take up to 3 business days. We ask that you follow-up with your pharmacy.

## 2020-07-19 NOTE — Telephone Encounter (Signed)
Requesting: NORCO Contract: 07/02/2020 UDS: 05/06/2018 Last OV: 06/24/20 Next OV: N/A Last Refill: 06/17/20, #120--0 RF Database:   Please advise

## 2020-07-19 NOTE — Progress Notes (Signed)
AVANA, KREISER ([id number]) Visit Report for 07/16/2020 Debridement Details Patient Name: Date of Service: [name], [name] 07/16/2020 3:00 PM Medical Record Number: [id number] Patient Account Number: [id number] Date of Birth/Sex: Treating RN: Jun 26, 1956 (64 y.o. F) [name], [place]rene [place] Primary Care Provider: LO WNE CHA SE, YV O NNE Other Clinician: Referring Provider: Treating Provider/Extender: Linton Ham LO WNE CHA SE, YV O NNE Weeks in Treatment: 5 Debridement Performed for Assessment: Wound #7 Left Calcaneus Performed By: Physician Ricard Dillon., MD Debridement Type: Debridement Severity of Tissue Pre Debridement: Fat layer exposed Level of Consciousness (Pre-procedure): Awake and Alert Pre-procedure Verification/Time Out Yes - 17:00 Taken: Start Time: 17:00 Pain Control: Other : benzocaine, 20% T Area Debrided (L x W): otal 2 (cm) x 1.4 (cm) = 2.8 (cm) Tissue and other material debrided: Viable, Non-Viable, Callus, Subcutaneous Level: Skin/Subcutaneous Tissue Debridement Description: Excisional Instrument: Curette Bleeding: Moderate Hemostasis Achieved: Pressure End Time: 17:01 Procedural Pain: 0 Post Procedural Pain: 0 Response to Treatment: Procedure was tolerated well Level of Consciousness (Post- Awake and Alert procedure): Post Debridement Measurements of Total Wound Length: (cm) 2 Width: (cm) 1.4 Depth: (cm) 0.3 Volume: (cm) 0.66 Character of Wound/Ulcer Post Debridement: Improved Severity of Tissue Post Debridement: Fat layer exposed Post Procedure Diagnosis Same as Pre-procedure Electronic Signature(s) Signed: 07/19/2020 8:08:43 AM By: Linton Ham MD Signed: 07/19/2020 1:33:30 PM By: Kela Millin Entered By: Linton Ham on 07/18/2020 12:51:27 -------------------------------------------------------------------------------- HPI Details Patient Name: Date of Service: [place], [name] 07/16/2020 3:00 PM Medical Record Number:  [id number] Patient Account Number: [id number] Date of Birth/Sex: Treating RN: 03-Aug-1956 (64 y.o. F) Kela Millin Primary Care Provider: LO WNE CHA SE, YV O NNE Other Clinician: Referring Provider: Treating Provider/Extender: Linton Ham LO WNE CHA SE, YV O NNE Weeks in Treatment: 5 History of Present Illness HPI Description: ADMISSION 11/12/2018 This is a 64 year old woman with type 2 diabetes and diabetic neuropathy. She has been dealing with a left heel plantar wound for roughly 2 years. She states this started when she pulled some skin off the area and it progressed into a wound. She also has an area on the tip of her left great toe for 1 year. She has been largely followed by Dr. Sharol Given and she had an excision of bone in the left heel in 2017. I do not see microbiology from this excision or pathology. Apparently this wound never really closed. She most recently has been using Silvadene cream. Offloading this with a scooter. The last MRI I see was in June 2018 which did not show osteomyelitis at that time. Apparently this wound is never really progressed towards healing. During her last review by Dr. Sharol Given in October it was recommended that she undergo a left BKA and she refused. She went to see a second orthopedic consult at [facility] who am recommended conservative wound care to see if this will close or progress towards closure but also warned that possible surgery may be necessary. An x-ray that was done at [facility] showed an irregular calcaneal body and calcaneal tuberosity. This is probably postprocedural. Previous x-rays at [facility] had suggested heterotrophic calcifications but I do not see this. Apparently a second ointment was added to the Silvadene which the patient thinks is because some improvement. She has not been systemically unwell. No fever or chills. She thinks the second ointment that was given to her at [facility] has helped somewhat. 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 ABIs done in June 2018 showed a ABI of 1.12 on the right and 1.1 on the left she was biphasic bilaterally. ABIs in our clinic were noncompressible today. 11/20/18 on evaluation today patient is seen for her second visit here in the office although this is actually the first visit with me concerning an issue that she's having with her great toe and heel of the left foot. Fortunately she does not appear to be have any discomfort at this time and she does have a scooter in order to offload her foot she also has a boot for offloading. With that being said this is something that appears to have been going on for some time she was seeing Dr. due to his recommendation was that she was going to require a below knee amputation. With that being said she wanted to come to the wound center but according to the patient Dr. Sharol Given told her that "we could not help her". Nonetheless she saw another provider who suggested that it may be worth a shot for [name] to try and help her out if it all possible. Nonetheless she decided that it would be worth a trial since otherwise any the way she's gonna end up with an amputation. Obviously if we can heal the wound then that will not be the case. Again I explained to her that obviously there are no guarantees but we will give this a good try and attempts to get the wound to heal. 1/30; the patient continues to have areas on the left plantar heel and the left plantar great toe. The more worrisome area is the heel. She went for MRI on Saturday but this could not be done because she had silver alginate in the wound bed. I would like to get this rebooked. If she does not have  osteomyelitis she will need a total contact cast. We have been using silver alginate in the wounds 2/7; patient has wounds on her left plantar heel and left plantar great toe. The area on the heel has some depth although it looks about the same today. Her MRI will finally be done tomorrow. If she has osteomyelitis in the heel and then we will need to consider her for IV antibiotics and hyperbaric oxygen. If the MRI is negative she will need a total contact cast although she is wearing a cam boot and using a scooter at present. Will be using silver alginate. She will take it off tomorrow in preparation for the MRI 2/14; the patient's MRI showed extensive surgical changes with a large portion of her calcaneus removed on the left secondary to her previous surgery by Dr. Sharol Given however there is no evidence of osteomyelitis. She would therefore be a candidate for a total contact cast and we applied this for the first time today 2/21; silver collagen total contact cast. The area on the plantar left great toe is "healed" still a lot of callus on this area. Dimensions on the plantar heel not too much different some epithelialization is present however. This is an improvement 12/25/18 on evaluation today patient actually is seen for follow-up concerning issues that she has been having with her cast she states she feels like it's wet and squishy in the  bottom of her cast. The reason she does come in to have this evaluated and see what is going on. Fortunately there's no signs of infection at this time was the cast was removed. 3/6; the patient's area on the tip of her right great toe is callused but there is no open area here. She still has the fairly extensive area in the left heel. We have been using silver collagen in the wound. 01/08/19 on evaluation today patient actually appears to be doing very well in regard to her heel ulcer in my pinion to see if you shown signs of improvement which is excellent news.  There's no evidence of infection. She continues to have quite a bit of drainage but fortunately again this doesn't seem to be hindering her healing. 3/20 -Patient's foot ulcer on the left appears to be doing well, the dimensions appear encouraging, we have been using total contact cast with Prisma and will continue doing that 3/27; patient continues to make nice progress on the left plantar heel. She is using silver collagen under a total contact cast. It is been a while since I have seen this wound and it really looks a lot better. Smaller with healthy granulation 4/3; she continues to make nice progress on the left plantar heel. Using silver collagen under a total contact cast 4/10; left plantar heel. Again skin over the surface of the wound with not much in the way of adherence. This results in undermining. Using silver collagen changed to silver alginate 4/17 left plantar heel. Again not much improvement. There is no undermining today no debridement was required we used silver alginate last time because of excessive moisture 4/24 left plantar heel. Again not as much improvement as I would have liked. About 3 mm of depth. Thick callused skin around the circumference. Using silver alginate 5/1; left plantar heel this is improved this week. Less depth epithelialization is present. Using silver alginate alginate under a total contact cast 5/8; left plantar heel dimensions are about the same. The depth appears to be improved. I use silver collagen starting today under the cast 5/15 left plantar heel. Arrives today with a 2-day history of feeling like something had "slipped" while walking her dog in the cast. Unfortunately extensive area relatively to the small wound of undermining superiorly denuded epithelium. 5/22-Patient returns at 1 week after being taken off the TCC on account of some fluid collection that was debrided and cultured at last visit from the plantar ulcer on the left heel. The  culture results are polymicrobial with Klebsiella, enterococcus, Proteus growth these organisms are sensitive to Cipro except with enterococcus which is sensitive to ampicillin but patient is highly allergic to penicillin according to her. 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 5/29; the patient arrived last week with a new wound on her left plantar great toe. With regards to the culture that I did 2 weeks ago of her deteriorating heel wound this grew Klebsiella and enterococcus. She was given Cipro however the enterococcus would not be covered well by a quinolone. She is allergic to penicillin. I will give her linezolid 600 twice daily x5 days 6/12; the patient continues to have a wound on her left and after she returns to the beach there is undermining laterally. She has the new wound from this 2 weeks or so ago on the plantar tip of her left great toe. She had  a fall today scraping the dorsal surface of the left fifth 7/14; READMISSION since the patient was last here she was hospitalized from 04/15/2019 through 04/22/2019. She had presented to an outside ER after falling and hitting her elbow. She had had previous surgery on the elbow and fractures several years ago. An x-ray was negative and she was sent home. She is readmitted with sepsis and acute renal failure secondary to septic arthritis of the elbow. Cultures apparently showed pansensitive staph aureus however she has a severe beta-lactam allergy. She was treated with vancomycin. Apparently the vancomycin is completed and her PICC line is removed although she is seeing Dr. Megan Salon tomorrow due to continued pain and swelling. In the hospital she had an IandD by Dr. [name] of orthopedics. The original surgery was on 04/17/2019 and it was felt that she had septic arthritis. She is still having a lot of pain and swelling in the right elbow and apparently is  due to see Dr. Megan Salon tomorrow and what she thinks is that she will be restarted on antibiotics. With regards to her heel wounds/left foot wounds. Her arterial studies were checked and her ABIs were within normal limits. X-ray showed plantar foot ulcer negative for osteomyelitis postop resection of the posterior calcaneus. She has been using silver alginate to the heel 8/4-Patient returns after being seen on 7/14, we are using silver alginate to the calcaneal wound, she is continuing to receive care for her right elbow septic arthritis 8/14- Patient returns after 1 week, she is being seen by the surgical group for her right elbow, she is here for the left calcaneal wound for which we are using silver alginate this is about the same 8/21; this is a patient I readmitted to the clinic 5 weeks ago. She is continuing to have difficulties with a septic arthritis of the right elbow she had after a fall and apparently has had surgical IandD's since the last time we saw her. She is also following with Dr. Megan Salon of infectious disease and is apparently on 3 oral antibiotics although at the time of this dictation I am not sure what they are. She comes in today with a necrotic surface on the left great toe with a blister laterally. This was still clearly an open wound. She had purulent drainage coming out of this. The original wound on the plantar aspect of her right heel in the setting of a Charcot foot is deep not open to bone but certainly not any better at all. We have been using silver alginate 8/28; dealing with septic arthritis of the right elbow. May need to go for further surgery here in the second week of September. She had a blister on the left great toe that was purulent [place] draining last week although a culture did not grow anything [already on antibiotics through infectious disease for her elbow]. The punched-out area on her heel is just like it was when she first came in. This almost closed  with a total contact cast there are no options for that now. The patient states she cannot stay off her foot having to do housework X-ray of the foot showed a moderate bunion and severe degenerative changes at the first metatarsal phalangeal joint there was a moderate plantar calcaneal spur. We will need to check the location of this. 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 She is apparently on a 3 times daily medication for her MRSA I will  need to see what that is. It is oral. We are using silver collagen to the punched-out area on her left heel and to the summer superficial area of the left great toe. She is offloading this is best she can although judging by the amount of callus it is not enough. She thinks she has enough strength in her right elbow now to use her scooter. 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 Hale Bogus notes and infectious disease. We are using silver collagen to the punched-out areas on her left heel and to the plantar aspect of her left great toe she is now using her scooter to get around and to help offload these areas 10/2; 2-week follow-up. Still on clindamycin I believe for the left elbow she is going for an MRI of the elbow over the weekend. She is then going to see orthopedics and infectious disease. The area on the left heel is certainly no better. This does not probe to bone however there is green drainage. She has an area on the tip of her toe. The area on the heel is in a wound we almost closed at one point with total contact casting. 10/9; one-week follow-up. Culture I did of the heel last week which was a swab culture admittedly showed moderate Enterococcus faecalis moderate Pseudomonas. The wound itself looks about the same. There is  no palpable bone although the depth of it closely approximates bone. The heel is swollen but not erythematous. Her MRI is booked for next week. She also has an MRI of the right elbow. I've also lifted the last consult from Dr. Megan Salon who is following her for septic arthritis of the right elbow. I did not see him specifically comment on her left heel however the patient states that he is aware of this. I did not specifically address the organisms I cultured last week because of the possible effect of any additional cultures that will be done on the elbow. Additionally these were superficial not bone cultures 10/16; her MRI of the heel was put back to next week. She did have her elbow done. I have been in contact with Dr. Megan Salon about my concerns about osteomyelitis of the left heel. We have been using collagen. She also has a wound on the plantar tip of her first toe 10/23; her MRI of the heel was negative for osteomyelitis but suggested a small abscess. She also had an MRI of her elbow that showed findings compatible with a septic joint. She went to see Dr. Megan Salon and she is going to have both oral and IV antibiotics which I am sure will cover any infection in the heel. I did not actually see his note today. We are using silver collagen offloading the heel with a heel offloading boot 11/6; patient continues with silver collagen to both wound areas on her plantar left heel and plantar left great toe. She remains on daptomycin by Dr. Megan Salon of infectious disease. She complains of diarrhea. Dr. Megan Salon is aware of this. 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 she has been put on oral vancomycin I think a lot of high clinical suspicion for pseudomembranous colitis Because  of the right elbow there are no options to completely off her left foot beyond the heel  offloading boot we have now. Fortunately the left heel ulcer itself has remained static some improvement in the plantar left great toe 11/20; patient arrives for review of her left heel ulcer. I also note she has a difficult time with septic arthritis of the right elbow. She currently is on IV daptomycin which seems to have helped the drainage in her elbow [MSSA]. Because of high concern for pseudomembranous colitis she was also put on vancomycin orally and Flagyl orally. She was on Levaquin but that was discontinued because of the concern about C. difficile. She states her diarrhea is better. She arrives in clinic today with a large area of denuded skin on the lateral part of the heel. This had totally separated. We have been using silver alginate to the wound areas. She arrived in a scooter telling me she is offloading the foot is much as possible. 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 Dr. Megan Salon stopped the daptomycin and now has her on vancomycin 1000 mg IV daily. This is supposed to go onto 1/18. She is being referred to [place] for what sounds like a elbow replacement type operation for her MSSA septic arthritis. She arrives in clinic today with a blister on the medial part of the wound on her heel. She still has a lot of callus around the heel although the surface of the wound on the left heel looks somewhat better. READMISSION 03/19/2020 Mrs. Beitler is a 64 year old woman we have followed for almost all of 2020 with a neuropathic wound on her left heel and the tip of her left great toe.. We she had previously had a partial calcanectomy by Dr. Sharol Given because of underlying osteomyelitis. She also had a history of MRSA even when we admitted her to the clinic. An MRI when she first came into our  clinic did not show osteomyelitis. We put her in a total contact cast and gradually this contracted to the point it was almost closed however in that same timeframe she had a fall. 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 We had to stop putting her in a cast partially because of infection and partially because she could not support herself with the elbow. Things went progressively downhill. When we last saw her at the end of December she had a large open wound on the left heel. She had had recurrent infections in the right elbow. It was felt that either the heel lift: Ice her elbow or vice versa. We never had MRSA I do not believe cultured in the left heel however. 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 She underwent an operative debridement and a T11-L3 fusion. An operative culture grew Pseudomonas. She was treated with cefepime and required readmission to the hospital with acute kidney failure requiring temporary dialysis. She was discharged to rehab I believe she signed herself out [place]. He has been following with her primary doctor and she comes in the clinic today with miraculously the left heel totally closed. She is followed up with Dr. Megan Salon of infectious disease he does not feel she has any evidence of infection in her elbow or her  heel for that matter and the heel is actually fully epithelialized. She is on ciprofloxacin 500 twice daily I think mostly directed at Pseudomonas. The patient is convinced that it was the treatment of the Pseudomonas in her back that ultimately led to closure of her heel and that certainly possible or could have been because she was nonambulatory in the hospital for 3 months according to her. She is still using her heel offloading boot and she says she is "learning to walk again" Readmission: 06/09/2020 patient presents today for reevaluation here in the  clinic following a reopening of her heel ulcer. She tells [name] that this actually occurred about 2 weeks after she was last seen in May of this year though it is now the beginning of August and she has not come back until now to see [name]. Nonetheless she does have an open wound there is been no x-rays at this time infectious disease has been monitoring him following this up until this point. They made the recommendation that she come to see [name]. Fortunately there is no signs of active infection systemically though she does tell me that she had Pseudomonas in the spine when she had her back surgery she is currently on antibiotics for that. 8/20; this is a patient who I saw 1 time in late May. Apparently shortly after she was here the area on her plantar left heel started to drain again and she had an open wound. She has been going to her primary physician and has been following with Dr. Megan Salon. 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 She is currently on 3 times a day prophylactic ciprofloxacin. She is admitted to the clinic last week for review of the wound on the left heel. Notable for the fact that she had a partial calcanectomy by Dr. Sharol Given 3 or 4 years ago. She says she is using her motorized wheelchair again as an off loader but she came in in regular shoes. 9/2; 2-week follow-up. Plantar left heel. In spite of debridement last week she had thick very adherent callus around the circumference of the wound and a nonviable gray surface. We have been using Hydrofera Blue. We gave her a heel offloading boot last week. She says she is off her foot except for she needs to walk her dog. 9/17; 2 week follow-up. Lives with a heel ulcer in roughly the same condition as a month ago. Though requiring debridement and revision using Hydrofera Blue. She says she is often except for times that she needs to walk her dog and  she claims to be using the heel offloading shoe. She has a new injury which she says was a burn on the medial part of her fifth metacarpophalangeal on the right. Electronic Signature(s) Signed: 07/19/2020 8:08:43 AM By: Linton Ham MD Entered By: Linton Ham on 07/18/2020 12:53:15 -------------------------------------------------------------------------------- Dressings and/or debridement of burns; small Details Patient Name: Date of Service: [name], [name] 07/16/2020 3:00 PM Medical Record Number: [id number] Patient Account Number: [id number] Date of Birth/Sex: Treating RN: 03/27/1956 (64 y.o. [place] [place] Primary Care Provider: LO WNE CHA SE, YV

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
## 2020-07-23 ENCOUNTER — Encounter: Admit: 2020-07-23 | Discharge: 2020-07-23 | Payer: MEDICARE

## 2020-07-23 DIAGNOSIS — E1129 Type 2 diabetes mellitus with other diabetic kidney complication: Secondary | ICD-10-CM

## 2020-07-28 ENCOUNTER — Ambulatory Visit: Payer: PRIVATE HEALTH INSURANCE | Admitting: Family

## 2020-07-28 ENCOUNTER — Other Ambulatory Visit: Payer: PRIVATE HEALTH INSURANCE

## 2020-07-30 ENCOUNTER — Encounter (HOSPITAL_BASED_OUTPATIENT_CLINIC_OR_DEPARTMENT_OTHER): Payer: PRIVATE HEALTH INSURANCE | Admitting: Internal Medicine

## 2020-08-03 ENCOUNTER — Inpatient Hospital Stay: Payer: PRIVATE HEALTH INSURANCE | Attending: Hematology & Oncology

## 2020-08-03 ENCOUNTER — Inpatient Hospital Stay (HOSPITAL_BASED_OUTPATIENT_CLINIC_OR_DEPARTMENT_OTHER): Payer: PRIVATE HEALTH INSURANCE | Admitting: Family

## 2020-08-03 ENCOUNTER — Other Ambulatory Visit: Payer: Self-pay

## 2020-08-03 ENCOUNTER — Encounter: Payer: Self-pay | Admitting: Family

## 2020-08-03 ENCOUNTER — Encounter: Admit: 2020-08-03 | Discharge: 2020-08-03 | Payer: MEDICARE

## 2020-08-03 VITALS — BP 142/85 | HR 97 | Temp 98.5°F | Resp 18 | Ht 62.0 in

## 2020-08-03 DIAGNOSIS — E538 Deficiency of other specified B group vitamins: Secondary | ICD-10-CM

## 2020-08-03 DIAGNOSIS — Z86718 Personal history of other venous thrombosis and embolism: Secondary | ICD-10-CM | POA: Insufficient documentation

## 2020-08-03 DIAGNOSIS — D6859 Other primary thrombophilia: Secondary | ICD-10-CM

## 2020-08-03 DIAGNOSIS — E7211 Homocystinuria: Secondary | ICD-10-CM

## 2020-08-03 DIAGNOSIS — D500 Iron deficiency anemia secondary to blood loss (chronic): Secondary | ICD-10-CM

## 2020-08-03 DIAGNOSIS — R7983 Abnormal findings of blood amino-acid level: Secondary | ICD-10-CM

## 2020-08-03 DIAGNOSIS — D509 Iron deficiency anemia, unspecified: Secondary | ICD-10-CM | POA: Diagnosis present

## 2020-08-03 DIAGNOSIS — Z7901 Long term (current) use of anticoagulants: Secondary | ICD-10-CM | POA: Diagnosis not present

## 2020-08-03 DIAGNOSIS — I82503 Chronic embolism and thrombosis of unspecified deep veins of lower extremity, bilateral: Secondary | ICD-10-CM

## 2020-08-03 DIAGNOSIS — Z79899 Other long term (current) drug therapy: Secondary | ICD-10-CM | POA: Diagnosis not present

## 2020-08-03 LAB — CBC WITH DIFFERENTIAL (CANCER CENTER ONLY)
Abs Immature Granulocytes: 0.01 10*3/uL (ref 0.00–0.07)
Basophils Absolute: 0 10*3/uL (ref 0.0–0.1)
Basophils Relative: 1 %
Eosinophils Absolute: 0.1 10*3/uL (ref 0.0–0.5)
Eosinophils Relative: 1 %
HCT: 30.5 % — ABNORMAL LOW (ref 36.0–46.0)
Hemoglobin: 9.6 g/dL — ABNORMAL LOW (ref 12.0–15.0)
Immature Granulocytes: 0 %
Lymphocytes Relative: 13 %
Lymphs Abs: 0.7 10*3/uL (ref 0.7–4.0)
MCH: 28 pg (ref 26.0–34.0)
MCHC: 31.5 g/dL (ref 30.0–36.0)
MCV: 88.9 fL (ref 80.0–100.0)
Monocytes Absolute: 0.3 10*3/uL (ref 0.1–1.0)
Monocytes Relative: 5 %
Neutro Abs: 4.3 10*3/uL (ref 1.7–7.7)
Neutrophils Relative %: 80 %
Platelet Count: 154 10*3/uL (ref 150–400)
RBC: 3.43 MIL/uL — ABNORMAL LOW (ref 3.87–5.11)
RDW: 15.1 % (ref 11.5–15.5)
WBC Count: 5.4 10*3/uL (ref 4.0–10.5)
nRBC: 0 % (ref 0.0–0.2)

## 2020-08-03 LAB — CMP (CANCER CENTER ONLY)
ALT: 6 U/L (ref 0–44)
AST: 10 U/L — ABNORMAL LOW (ref 15–41)
Albumin: 3.2 g/dL — ABNORMAL LOW (ref 3.5–5.0)
Alkaline Phosphatase: 82 U/L (ref 38–126)
Anion gap: 6 (ref 5–15)
BUN: 27 mg/dL — ABNORMAL HIGH (ref 8–23)
CO2: 25 mmol/L (ref 22–32)
Calcium: 9.3 mg/dL (ref 8.9–10.3)
Chloride: 99 mmol/L (ref 98–111)
Creatinine: 1.95 mg/dL — ABNORMAL HIGH (ref 0.44–1.00)
GFR, Estimated: 27 mL/min — ABNORMAL LOW (ref >60)
Glucose, Bld: 496 mg/dL — ABNORMAL HIGH (ref 70–99)
Potassium: 5.2 mmol/L — ABNORMAL HIGH (ref 3.5–5.1)
Sodium: 130 mmol/L — ABNORMAL LOW (ref 135–145)
Total Bilirubin: 0.4 mg/dL (ref 0.3–1.2)
Total Protein: 7.6 g/dL (ref 6.5–8.1)

## 2020-08-03 LAB — VITAMIN B12: Vitamin B-12: 244 pg/mL (ref 180–914)

## 2020-08-03 LAB — D-DIMER, QUANTITATIVE: D-Dimer, Quant: 1.64 ug/mL-FEU — ABNORMAL HIGH (ref 0.00–0.50)

## 2020-08-03 MED ORDER — BETAMETHASONE VALERATE 0.1 % TP CREA
Freq: Every day | 1 refills
Start: 2020-08-03 — End: ?

## 2020-08-03 NOTE — Progress Notes (Addendum)
Hematology and Oncology Follow Up Visit  [name] [name] [id number] 02-15-1956 64 y.o. 08/03/2020   Principle Diagnosis:  Acute DVT ofboth the right and left peroneal and posterior tibial veins Hyperhomocystinemia   Iron deficiency anemia   Current Therapy: Xarelto 15 mg PO daily  Folic acid 2 mg PO daily IV iron as indicated    Interim History:  [name] [name] is here today for follow-up. She is doing fairly well but still notes fatigue.  She is tolerating Xarelto nicely and has not noted any blood loss. No abnormal bruising or petechiae.  Homocystine level in August was 22, anticardiolipin IgG level was 18 and d-dimer level was improved at 1.55.  She has occasional chest discomfort. No fever, chills, n/v, cough, rash, dizziness, SOB< chest pain, palpitations, abdominal pain or changes in bowel or bladder habits.  Her left foot ulcer continues to heal. She states that her wound care PA may switch her to a cast the week on Friday to reduce pressure.  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  No falls or syncopal episodes to report. She gets around on her motorized scooter nicely.  Her appetite comes and goes. She does her best to stay well hydrated.   ECOG Performance Status: 2 - Symptomatic, <50% confined to bed  Medications:  Allergies as of 08/03/2020      Reactions   Bee Pollen Anaphylaxis   Fish-derived Products Hives, Shortness Of Breath, Swelling, Rash   Hives get in throat causing trouble breathing   Mushroom Extract Complex Anaphylaxis   Penicillins Anaphylaxis   **Tolerated cefepime March 2021 Did it involve swelling of the face/tongue/throat, SOB, or low BP? Yes Did it involve sudden or severe rash/hives, skin peeling, or any reaction on the inside of your mouth or nose? No Did you need to seek medical attention at a hospital or doctor's office? Yes When did it last happen?A few months ago If all above answers are "NO", may proceed  with cephalosporin use.   Rosemary Oil Anaphylaxis   Shellfish Allergy Hives, Shortness Of Breath, Swelling, Rash   Tomato Hives, Shortness Of Breath   Hives in throat causes her trouble breathing   Acetaminophen Other (See Comments)   GI upset   Acyclovir And Related Other (See Comments)   Unknown rxn   Aloe Vera Hives   Broccoli [brassica Oleracea] Hives   Naproxen Other (See Comments)   Unknown rxn      Medication List       Accurate as of August 03, 2020  1:27 PM. If you have any questions, ask your nurse or doctor.        acetaminophen 325 MG tablet Commonly known as: TYLENOL Take 2 tablets (650 mg total) by mouth every 6 (six) hours as needed for fever.   atorvastatin 10 MG tablet Commonly known as: LIPITOR Take 1 tablet by mouth once daily   bethanechol 10 MG tablet Commonly known as: URECHOLINE Take 1 tablet (10 mg total) by mouth 3 (three) times daily.   ciprofloxacin 500 MG tablet Commonly known as: Cipro Take 1 tablet (500 mg total) by mouth daily.   diazepam 5 MG tablet Commonly known as: VALIUM Take 1 tablet (5 mg total) by mouth every 12 (twelve) hours as needed for anxiety.   DULoxetine 60 MG capsule Commonly known as: Cymbalta Take 1 capsule (60 mg total) by mouth daily.   folic acid 1 MG tablet Commonly known as: FOLVITE Take 2 tablets (2 mg  total) by mouth daily.   FreeStyle Libre 14 Day Reader Kerrin Mo 1 Device by Does not apply route daily.   FreeStyle Libre 14 Day Sensor Misc 1 Device by Does not apply route daily.   HYDROcodone-acetaminophen 5-325 MG tablet Commonly known as: NORCO/VICODIN Take 1 tablet by mouth every 6 (six) hours as needed for moderate pain.   lidocaine 5 % Commonly known as: Lidoderm Place 1 patch onto the skin daily. Remove & Discard patch within 12 hours or as directed by MD   lisinopril 10 MG tablet Commonly known as: ZESTRIL Take 10 mg by mouth daily.   metroNIDAZOLE 500 MG tablet Commonly known as:  Flagyl Take 1 tablet (500 mg total) by mouth 3 (three) times daily.   multivitamin with minerals Tabs tablet Take 1 tablet by mouth daily.   oxyCODONE 5 MG immediate release tablet Commonly known as: Oxy IR/ROXICODONE Take 1 tablet (5 mg total) by mouth every 3 (three) hours as needed for moderate pain ((score 4 to 6)).   pantoprazole 40 MG tablet Commonly known as: PROTONIX Take 1 tablet (40 mg total) by mouth daily at 6 (six) AM.   pregabalin 75 MG capsule Commonly known as: LYRICA Take 1 capsule (75 mg total) by mouth 3 (three) times daily.   Rivaroxaban 15 MG Tabs tablet Commonly known as: XARELTO Take 1 tablet (15 mg total) by mouth daily with supper.       Allergies:  Allergies  Allergen Reactions  . Bee Pollen Anaphylaxis  . Fish-Derived Products Hives, Shortness Of Breath, Swelling and Rash    Hives get in throat causing trouble breathing  . Mushroom Extract Complex Anaphylaxis  . Penicillins Anaphylaxis    **Tolerated cefepime March 2021 Did it involve swelling of the face/tongue/throat, SOB, or low BP? Yes Did it involve sudden or severe rash/hives, skin peeling, or any reaction on the inside of your mouth or nose? No Did you need to seek medical attention at a hospital or doctor's office? Yes When did it last happen?A few months ago If all above answers are "NO", may proceed with cephalosporin use.  [name] Kitchen Rosemary Oil Anaphylaxis  . Shellfish Allergy Hives, Shortness Of Breath, Swelling and Rash  . Tomato Hives and Shortness Of Breath    Hives in throat causes her trouble breathing  . Acetaminophen Other (See Comments)    GI upset  . Acyclovir And Related Other (See Comments)    Unknown rxn  . Aloe Vera Hives  . Broccoli [Brassica Oleracea] Hives  . Naproxen Other (See Comments)    Unknown rxn    Past Medical History, Surgical history, Social history, and Family History were reviewed and updated.  Review of Systems: All other 10 point review of systems  is negative.   Physical Exam:  vitals were not taken for this visit.   Wt Readings from Last 3 Encounters:  06/15/20 176 lb (79.8 kg)  02/26/20 187 lb 13.3 oz (85.2 kg)  02/18/20 190 lb 9.6 oz (86.5 kg)    Ocular: Sclerae unicteric, pupils equal, round and reactive to light Ear-nose-throat: Oropharynx clear, dentition fair Lymphatic: No cervical or supraclavicular adenopathy Lungs no rales or rhonchi, good excursion bilaterally Heart regular rate and rhythm, no murmur appreciated Abd soft, nontender, positive bowel sounds MSK no joint edema Neuro: non-focal, well-oriented, appropriate affect Breasts: Deferred   Lab Results  Component Value Date   WBC 5.4 08/03/2020   HGB 9.6 (L) 08/03/2020   HCT 30.5 (L) 08/03/2020   MCV  88.9 08/03/2020   PLT 154 08/03/2020   Lab Results  Component Value Date   FERRITIN 429 (H) 04/21/2020   IRON 32 (L) 04/21/2020   TIBC 228 (L) 04/21/2020   UIBC 196 04/21/2020   IRONPCTSAT 14 (L) 04/21/2020   Lab Results  Component Value Date   RETICCTPCT 2.8 04/21/2020   RBC 3.43 (L) 08/03/2020   No results found for: KPAFRELGTCHN, LAMBDASER, KAPLAMBRATIO No results found for: IGGSERUM, IGA, IGMSERUM No results found for: Odetta Pink, SPEI   Chemistry      Component Value Date/Time   NA 136 06/24/2020 1431   K 5.7 (H) 06/24/2020 1431   CL 105 06/24/2020 1431   CO2 24 06/24/2020 1431   BUN 33 (H) 06/24/2020 1431   CREATININE 1.97 (H) 06/24/2020 1431      Component Value Date/Time   CALCIUM 8.6 06/24/2020 1431   ALKPHOS 73 06/16/2020 1403   AST 14 06/24/2020 1431   AST 12 (L) 06/16/2020 1403   ALT 8 06/24/2020 1431   ALT 6 06/16/2020 1403   BILITOT 0.4 06/24/2020 1431   BILITOT 0.3 06/16/2020 1403       Impression and Plan: [name] [name] is a very pleasant 64 yo caucasian female withlong standing history of anemia as well as recent diagnosis of DVT's in both the right and left  peroneal and posterior tibial veins while she was bed bound in the hospital being treated for osteomyelitis.  She had an elevated homocystine level, mildly elevated anticardiolipin antibody IgG level and D-dimer was also high at that time.  She will continue her same regimen with Xarelto.  She is taking her folic acid daily as prescribed.  Iron studies and B 12 level are pending. We will replace if needed.  Follow-up in 6 weeks.  She can contact our office with any questions or concerns.   Laverna Peace, NP 10/5/20211:27 PM    Addendum: 08/03/2020 4:53 pm I called [name] [name] to let her know her blood glucose was 496. She states that she had lunch and a lollipop before coming in today and that her blood sugar this morning was 109. She is currently on both Humalog and Lantus and will be checking her blood sugar again this evening. She states that she feels fine and is asymptomatic at this time. She will continue to monitor this and avoid sugary treats. In the event of an emergency she will go to the ED.

## 2020-08-04 ENCOUNTER — Telehealth: Payer: Self-pay | Admitting: Family

## 2020-08-04 ENCOUNTER — Ambulatory Visit: Payer: PRIVATE HEALTH INSURANCE | Admitting: Internal Medicine

## 2020-08-04 LAB — FERRITIN: Ferritin: 490 ng/mL — ABNORMAL HIGH (ref 11–307)

## 2020-08-04 LAB — CARDIOLIPIN ANTIBODIES, IGG, IGM, IGA
Anticardiolipin IgA: <9 APL U/mL (ref 0–11)
Anticardiolipin IgG: 22 GPL U/mL — ABNORMAL HIGH (ref 0–14)
Anticardiolipin IgM: 10 MPL U/mL (ref 0–12)

## 2020-08-04 LAB — IRON AND TIBC
Iron: 42 ug/dL (ref 41–142)
Saturation Ratios: 24 % (ref 21–57)
TIBC: 171 ug/dL — ABNORMAL LOW (ref 236–444)
UIBC: 130 ug/dL (ref 120–384)

## 2020-08-04 LAB — HOMOCYSTEINE: Homocysteine: 18.5 umol/L — ABNORMAL HIGH (ref 0.0–17.2)

## 2020-08-04 NOTE — Telephone Encounter (Signed)
Appointments scheduled LMVM & calendar printed & mailed per 10/5 los

## 2020-08-06 ENCOUNTER — Telehealth: Payer: Self-pay | Admitting: *Deleted

## 2020-08-06 ENCOUNTER — Encounter (HOSPITAL_BASED_OUTPATIENT_CLINIC_OR_DEPARTMENT_OTHER): Payer: PRIVATE HEALTH INSURANCE | Admitting: Internal Medicine

## 2020-08-06 ENCOUNTER — Encounter: Admit: 2020-08-06 | Discharge: 2020-08-06 | Payer: MEDICARE

## 2020-08-06 MED ORDER — BD ALCOHOL SWABS TP PADM
0 refills
Start: 2020-08-06 — End: ?

## 2020-08-06 NOTE — Telephone Encounter (Signed)
Called pt, unable to lmovm as pt's phone is disconnected or out of service.

## 2020-08-06 NOTE — Telephone Encounter (Signed)
-----   Message from Eliezer Bottom, NP sent at 08/04/2020  1:52 PM EDT ----- Her iron studies look good! B 12 level is on low end of normal. She can start taking a daily sublingual supplement. Take as directed on the packaging.   Sarah  ----- Message ----- From: Buel Ream, [facility] [place] Sent: 08/03/2020   6:47 PM EDT To: Eliezer Bottom, NP

## 2020-08-10 ENCOUNTER — Ambulatory Visit: Payer: PRIVATE HEALTH INSURANCE | Admitting: Internal Medicine

## 2020-08-11 ENCOUNTER — Encounter: Admit: 2020-08-11 | Discharge: 2020-08-11 | Payer: MEDICARE

## 2020-08-11 MED ORDER — PANTOPRAZOLE 40 MG PO TBEC
ORAL_TABLET | Freq: Every day | 1 refills
Start: 2020-08-11 — End: ?

## 2020-08-13 ENCOUNTER — Other Ambulatory Visit: Payer: Self-pay

## 2020-08-13 ENCOUNTER — Encounter (HOSPITAL_BASED_OUTPATIENT_CLINIC_OR_DEPARTMENT_OTHER): Payer: PRIVATE HEALTH INSURANCE | Attending: Internal Medicine | Admitting: Internal Medicine

## 2020-08-13 DIAGNOSIS — I10 Essential (primary) hypertension: Secondary | ICD-10-CM | POA: Insufficient documentation

## 2020-08-13 DIAGNOSIS — Z8614 Personal history of Methicillin resistant Staphylococcus aureus infection: Secondary | ICD-10-CM | POA: Insufficient documentation

## 2020-08-13 DIAGNOSIS — E11319 Type 2 diabetes mellitus with unspecified diabetic retinopathy without macular edema: Secondary | ICD-10-CM | POA: Insufficient documentation

## 2020-08-13 DIAGNOSIS — I219 Acute myocardial infarction, unspecified: Secondary | ICD-10-CM | POA: Insufficient documentation

## 2020-08-13 DIAGNOSIS — M797 Fibromyalgia: Secondary | ICD-10-CM | POA: Diagnosis not present

## 2020-08-13 DIAGNOSIS — L97522 Non-pressure chronic ulcer of other part of left foot with fat layer exposed: Secondary | ICD-10-CM | POA: Diagnosis not present

## 2020-08-13 DIAGNOSIS — E11621 Type 2 diabetes mellitus with foot ulcer: Secondary | ICD-10-CM | POA: Diagnosis present

## 2020-08-13 DIAGNOSIS — E1142 Type 2 diabetes mellitus with diabetic polyneuropathy: Secondary | ICD-10-CM | POA: Insufficient documentation

## 2020-08-13 DIAGNOSIS — I252 Old myocardial infarction: Secondary | ICD-10-CM | POA: Insufficient documentation

## 2020-08-13 NOTE — Progress Notes (Signed)
[name] [name], [name] [name] ([id number]) Visit Report for 08/13/2020 Debridement Details Patient Name: Date of Service: [name] [name] [name], [place] [facility] [name]. 08/13/2020 3:00 PM Medical Record Number: [id number] Patient Account Number: [id number] Date of Birth/Sex: Treating RN: 1956/10/22 (64 y.o. [name] [name] Primary Care Provider: [name] Other Clinician: Referring Provider: Treating Provider/Extender: [name] in Treatment: 9 Debridement Performed for Assessment: Wound #7 Left Calcaneus Performed By: Physician [name] [name]., MD Debridement Type: Debridement Severity of Tissue Pre Debridement: Fat layer exposed Level of Consciousness (Pre-procedure): Awake and Alert Pre-procedure Verification/Time Out Yes - 16:50 Taken: Start Time: 16:52 Pain Control: Other : Benzocaine 20% T Area Debrided (L x W): otal 2.5 (cm) x 1.9 (cm) = 4.75 (cm) Tissue and other material debrided: Viable, Non-Viable, Callus, Slough, Subcutaneous, Skin: Epidermis, Slough Level: Skin/Subcutaneous Tissue Debridement Description: Excisional Instrument: Curette Bleeding: Minimum Hemostasis Achieved: Pressure End Time: 16:58 Procedural Pain: 0 Post Procedural Pain: 0 Response to Treatment: Procedure was tolerated well Level of Consciousness (Post- Awake and Alert procedure): Post Debridement Measurements of Total Wound Length: (cm) 2.5 Width: (cm) 1.7 Depth: (cm) 0.7 Volume: (cm) 2.337 Character of Wound/Ulcer Post Debridement: Improved Severity of Tissue Post Debridement: Fat layer exposed Post Procedure Diagnosis Same as Pre-procedure Electronic Signature(s) Signed: 08/13/2020 6:16:54 PM By: Linton Ham MD Signed: 08/13/2020 6:20:53 PM By: Baruch Gouty RN, BSN Entered By: Linton Ham on 08/13/2020 18:02:18 -------------------------------------------------------------------------------- HPI Details Patient Name: Date of Service: [name] [name], [name] [name].  08/13/2020 3:00 PM Medical Record Number: [id number] Patient Account Number: [id number] Date of Birth/Sex: Treating RN: Mar 04, 1956 (64 y.o. [name] [name] Primary Care Provider: [name] Other Clinician: Referring Provider: Treating Provider/Extender: [name] in Treatment: 9 History of Present Illness HPI Description: ADMISSION 11/12/2018 This is a 64 year old woman with type 2 diabetes and diabetic neuropathy. She has been dealing with a left heel plantar wound for roughly 2 years. She states this started when she pulled some skin off the area and it progressed into a wound. She also has an area on the tip of her left great toe for 1 year. She has been largely followed by Dr. Sharol [name] and she had an excision of bone in the left heel in 2017. I do not see microbiology from this excision or pathology. Apparently this wound never really closed. She most recently has been using Silvadene cream. Offloading this with a scooter. The last MRI I see was in June 2018 which did not show osteomyelitis at that time. Apparently this wound is never really progressed towards healing. During her last review by Dr. Sharol [name] in October it was recommended that she undergo a left BKA and she refused. She went to see a second orthopedic consult at [facility] who am recommended conservative wound care to see if this will close or progress towards closure but also warned that possible surgery may be necessary. An x-ray that was done at [facility] showed an irregular calcaneal body and calcaneal tuberosity. This is probably postprocedural. Previous x-rays at [facility] had suggested heterotrophic calcifications but I do not see this. Apparently a second ointment was added to the Silvadene which the patient thinks is because some improvement. She has not been systemically unwell. No fever or chills. She thinks the second ointment that was [name] to her at [facility] has helped somewhat. 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 ABIs done in June 2018 showed a ABI of 1.12 on the right and 1.1 on the left she was biphasic bilaterally. ABIs in our clinic were noncompressible today. 11/20/18 on evaluation today patient is seen for her second visit here in the office although this is actually the first visit with me concerning an issue that she's having with her great toe and heel of the left foot. Fortunately she does not appear to be have any discomfort at this time and she does have a scooter in order to offload her foot she also has a boot for offloading. With that being said this is something that appears to have been going on for some time she was seeing Dr. due to his recommendation was that she was going to require a below knee amputation. With that being said she wanted to come to the wound center but according to the patient Dr. Sharol [name] told her that "we could not help her". Nonetheless she saw another provider who suggested that it may be worth a shot for [name] to try and help her out if it all possible. Nonetheless she decided that it would be worth a trial since otherwise any the way she's gonna end up with an amputation. Obviously if we can heal the wound then that will not be the case. Again I explained to her that obviously there are no guarantees but we will give this a good try and attempts to get the wound to heal. 1/30; the patient continues to have areas on the left plantar heel and the left plantar great toe. The more worrisome area is the heel. She went for MRI on Saturday but this could not be done because she had silver alginate in the wound bed. I would like to get this  rebooked. If she does not have osteomyelitis she will need a total contact cast. We have been using silver alginate in the wounds 2/7; patient has wounds on her left plantar heel and left plantar great toe. The area on the heel has some depth although it looks about the same today. Her MRI will finally be done tomorrow. If she has osteomyelitis in the heel and then we will need to consider her for IV antibiotics and hyperbaric oxygen. If the MRI is negative she will need a total contact cast although she is wearing a cam boot and using a scooter at present. Will be using silver alginate. She will take it off tomorrow in preparation for the MRI 2/14; the patient's MRI showed extensive surgical changes with a large portion of her calcaneus removed on the left secondary to her previous surgery by Dr. Sharol [name] however there is no evidence of osteomyelitis. She would therefore be a candidate for a total contact cast and we applied this for the first time today 2/21; silver collagen total contact cast. The area on the plantar left great toe is "healed" still a lot of callus on this area. Dimensions on the plantar heel not too much different some epithelialization is present however. This is an improvement 12/25/18 on evaluation today patient actually is seen for follow-up concerning issues that she has been having with her cast she states she feels like it's wet and squishy in the bottom of her cast. The reason she does come in to have this evaluated  and see what is going on. Fortunately there's no signs of infection at this time was the cast was removed. 3/6; the patient's area on the tip of her right great toe is callused but there is no open area here. She still has the fairly extensive area in the left heel. We have been using silver collagen in the wound. 01/08/19 on evaluation today patient actually appears to be doing very well in regard to her heel ulcer in my pinion to see if you shown signs of  improvement which is excellent news. There's no evidence of infection. She continues to have quite a bit of drainage but fortunately again this doesn't seem to be hindering her healing. 3/20 -Patient's foot ulcer on the left appears to be doing well, the dimensions appear encouraging, we have been using total contact cast with Prisma and will continue doing that 3/27; patient continues to make nice progress on the left plantar heel. She is using silver collagen under a total contact cast. It is been a while since I have seen this wound and it really looks a lot better. Smaller with healthy granulation 4/3; she continues to make nice progress on the left plantar heel. Using silver collagen under a total contact cast 4/10; left plantar heel. Again skin over the surface of the wound with not much in the way of adherence. This results in undermining. Using silver collagen changed to silver alginate 4/17 left plantar heel. Again not much improvement. There is no undermining today no debridement was required we used silver alginate last time because of excessive moisture 4/24 left plantar heel. Again not as much improvement as I would have liked. About 3 mm of depth. Thick callused skin around the circumference. Using silver alginate 5/1; left plantar heel this is improved this week. Less depth epithelialization is present. Using silver alginate alginate under a total contact cast 5/8; left plantar heel dimensions are about the same. The depth appears to be improved. I use silver collagen starting today under the cast 5/15 left plantar heel. Arrives today with a 2-day history of feeling like something had "slipped" while walking her dog in the cast. Unfortunately extensive area relatively to the small wound of undermining superiorly denuded epithelium. 5/22-Patient returns at 1 week after being taken off the [facility] on account of some fluid collection that was debrided and cultured at last visit from the  plantar ulcer on the left heel. The culture results are polymicrobial with Klebsiella, enterococcus, Proteus growth these organisms are sensitive to Cipro except with enterococcus which is sensitive to ampicillin but patient is highly allergic to penicillin according to her. 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 5/29; the patient arrived last week with a new wound on her left plantar great toe. With regards to the culture that I did 2 weeks ago of her deteriorating heel wound this grew Klebsiella and enterococcus. She was [name] Cipro however the enterococcus would not be covered well by a quinolone. She is allergic to penicillin. I will give her linezolid 600 twice daily x5 days 6/12; the patient continues to have a wound on her left and after she returns to the beach there is undermining laterally. She has the new wound from this 2 weeks or so ago on the plantar tip of her left great toe. She had a fall today scraping the dorsal surface of the left fifth 7/14; READMISSION since  the patient was last here she was hospitalized from 04/15/2019 through 04/22/2019. She had presented to an outside ER after falling and hitting her elbow. She had had previous surgery on the elbow and fractures several years ago. An x-ray was negative and she was sent home. She is readmitted with sepsis and acute renal failure secondary to septic arthritis of the elbow. Cultures apparently showed pansensitive staph aureus however she has a severe beta-lactam allergy. She was treated with vancomycin. Apparently the vancomycin is completed and her PICC line is removed although she is seeing Dr. Megan Salon tomorrow due to continued pain and swelling. In the hospital she had an IandD by Dr. [name] of orthopedics. The original surgery was on 04/17/2019 and it was felt that she had septic arthritis. She is still having a lot of pain and swelling  in the right elbow and apparently is due to see Dr. Megan Salon tomorrow and what she thinks is that she will be restarted on antibiotics. With regards to her heel wounds/left foot wounds. Her arterial studies were checked and her ABIs were within normal limits. X-ray showed plantar foot ulcer negative for osteomyelitis postop resection of the posterior calcaneus. She has been using silver alginate to the heel 8/4-Patient returns after being seen on 7/14, we are using silver alginate to the calcaneal wound, she is continuing to receive care for her right elbow septic arthritis 8/14- Patient returns after 1 week, she is being seen by the surgical group for her right elbow, she is here for the left calcaneal wound for which we are using silver alginate this is about the same 8/21; this is a patient I readmitted to the clinic 5 weeks ago. She is continuing to have difficulties with a septic arthritis of the right elbow she had after a fall and apparently has had surgical IandD's since the last time we saw her. She is also following with Dr. Megan Salon of infectious disease and is apparently on 3 oral antibiotics although at the time of this dictation I am not sure what they are. She comes in today with a necrotic surface on the left great toe with a blister laterally. This was still clearly an open wound. She had purulent drainage coming out of this. The original wound on the plantar aspect of her right heel in the setting of a Charcot foot is deep not open to bone but certainly not any better at all. We have been using silver alginate 8/28; dealing with septic arthritis of the right elbow. May need to go for further surgery here in the second week of September. She had a blister on the left great toe that was purulent [place] draining last week although a culture did not grow anything [already on antibiotics through infectious disease for her elbow]. The punched-out area on her heel is just like it was when she  first came in. This almost closed with a total contact cast there are no options for that now. The patient states she cannot stay off her foot having to do housework X-ray of the foot showed a moderate bunion and severe degenerative changes at the first metatarsal phalangeal joint there was a moderate plantar calcaneal spur. We will need to check the location of this. 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 It is oral. We are using silver collagen  to the punched-out area on her left heel and to the summer superficial area of the left great toe. She is offloading this is best she can although judging by the amount of callus it is not enough. She thinks she has enough strength in her right elbow now to use her scooter. 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 Hale Bogus notes and infectious disease. We are using silver collagen to the punched-out areas on her left heel and to the plantar aspect of her left great toe she is now using her scooter to get around and to help offload these areas 10/2; 2-week follow-up. Still on clindamycin I believe for the left elbow she is going for an MRI of the elbow over the weekend. She is then going to see orthopedics and infectious disease. The area on the left heel is certainly no better. This does not probe to bone however there is green drainage. She has an area on the tip of her toe. The area on the heel is in a wound we almost closed at one point with total contact casting. 10/9; one-week follow-up. Culture I did of the heel last week which was a swab culture admittedly showed moderate Enterococcus faecalis moderate Pseudomonas. The wound  itself looks about the same. There is no palpable bone although the depth of it closely approximates bone. The heel is swollen but not erythematous. Her MRI is booked for next week. She also has an MRI of the right elbow. I've also lifted the last consult from Dr. Megan Salon who is following her for septic arthritis of the right elbow. I did not see him specifically comment on her left heel however the patient states that he is aware of this. I did not specifically address the organisms I cultured last week because of the possible effect of any additional cultures that will be done on the elbow. Additionally these were superficial not bone cultures 10/16; her MRI of the heel was put back to next week. She did have her elbow done. I have been in contact with Dr. Megan Salon about my concerns about osteomyelitis of the left heel. We have been using collagen. She also has a wound on the plantar tip of her first toe 10/23; her MRI of the heel was negative for osteomyelitis but suggested a small abscess. She also had an MRI of her elbow that showed findings compatible with a septic joint. She went to see Dr. Megan Salon and she is going to have both oral and IV antibiotics which I am sure will cover any infection in the heel. I did not actually see his note today. We are using silver collagen offloading the heel with a heel offloading boot 11/6; patient continues with silver collagen to both wound areas on her plantar left heel and plantar left great toe. She remains on daptomycin by Dr. Megan Salon of infectious disease. She complains of diarrhea. Dr. Megan Salon is aware of this. 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 she has been put on oral vancomycin I think a lot of high clinical suspicion for pseudomembranous colitis Because of the right elbow there are no options to  completely off her left  foot beyond the heel offloading boot we have now. Fortunately the left heel ulcer itself has remained static some improvement in the plantar left great toe 11/20; patient arrives for review of her left heel ulcer. I also note she has a difficult time with septic arthritis of the right elbow. She currently is on IV daptomycin which seems to have helped the drainage in her elbow [MSSA]. Because of high concern for pseudomembranous colitis she was also put on vancomycin orally and Flagyl orally. She was on Levaquin but that was discontinued because of the concern about C. difficile. She states her diarrhea is better. She arrives in clinic today with a large area of denuded skin on the lateral part of the heel. This had totally separated. We have been using silver alginate to the wound areas. She arrived in a scooter telling me she is offloading the foot is much as possible. 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 Dr. Megan Salon stopped the daptomycin and now has her on vancomycin 1000 mg IV daily. This is supposed to go onto 1/18. She is being referred to [facility] for what sounds like a elbow replacement type operation for her MSSA septic arthritis. She arrives in clinic today with a blister on the medial part of the wound on her heel. She still has a lot of callus around the heel although the surface of the wound on the left heel looks somewhat better. READMISSION 03/19/2020 [name] [name] is a 64 year old woman we have followed for almost all of 2020 with a neuropathic wound on her left heel and the tip of her left great toe.. We she had previously had a partial calcanectomy by Dr. Sharol [name] because of underlying osteomyelitis. She also had a history of MRSA even when we admitted her to the  clinic. An MRI when she first came into our clinic did not show osteomyelitis. We put her in a total contact cast and gradually this contracted to the point it was almost closed however in that same timeframe she had a fall. 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 We had to stop putting her in a cast partially because of infection and partially because she could not support herself with the elbow. Things went progressively downhill. When we last saw her at the end of December she had a large open wound on the left heel. She had had recurrent infections in the right elbow. It was felt that either the heel lift: Ice her elbow or vice versa. We never had MRSA I do not believe cultured in the left heel however. 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 She underwent an operative debridement and a T11-L3 fusion. An operative culture grew Pseudomonas. She was treated with cefepime and required readmission to the hospital with acute kidney failure requiring temporary dialysis. She was discharged to rehab I believe she signed herself out Gary. He has been following with her primary doctor and she comes in the clinic today with miraculously the left heel totally closed. She is followed up with Dr. Megan Salon of infectious disease he does not feel she has any evidence of infection in her elbow or her heel for that matter and the heel is actually fully epithelialized. She is on  ciprofloxacin 500 twice daily I think mostly directed at Pseudomonas. The patient is convinced that it was the treatment of the Pseudomonas in her back that ultimately led to closure of her heel and that certainly possible or could have been because she was nonambulatory in the hospital for 3 months according to her. She is still using her heel offloading boot and she says she is "learning to walk again" Readmission: 06/09/2020 patient  presents today for reevaluation here in the clinic following a reopening of her heel ulcer. She tells [name] that this actually occurred about 2 weeks after she was last seen in May of this year though it is now the beginning of August and she has not come back until now to see [name]. Nonetheless she does have an open wound there is been no x-rays at this time infectious disease has been monitoring him following this up until this point. They made the recommendation that she come to see [name]. Fortunately there is no signs of active infection systemically though she does tell me that she had Pseudomonas in the spine when she had her back surgery she is currently on antibiotics for that. 8/20; this is a patient who I saw 1 time in late May. Apparently shortly after she was here the area on her plantar left heel started to drain again and she had an open wound. She has been going to her primary physician and has been following with Dr. Megan Salon. 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 She is currently on 3 times a day prophylactic ciprofloxacin. She is admitted to the clinic last week for review of the wound on the left heel. Notable for the fact that she had a partial calcanectomy by Dr. Sharol [name] 3 or 4 years ago. She says she is using her motorized wheelchair again as an off loader but she came in in regular shoes. 9/2; 2-week follow-up. Plantar left heel. In spite of debridement last week she had thick very adherent callus around the circumference of the wound and a nonviable gray surface. We have been using Hydrofera Blue. We gave her a heel offloading boot last week. She says she is off her foot except for she needs to walk her dog. 9/17; 2 week follow-up. Lives with a heel ulcer in roughly the same condition as a month ago. Though requiring debridement and revision using Hydrofera Blue. She says she is often except  for times that she needs to walk her dog and she claims to be using the heel offloading shoe. She has a new injury which she says was a burn on the medial part of her fifth metacarpophalangeal on the right. 10/15; almost 1 month follow-up. She arrives with the plantar left heel ulcer and not a very good condition. She has thick callus surrounding the wound and a necrotic base over the top of this. I am not sure anybody is paying close attention to this. She says she walks minimally but I highly doubt this. She is supposed to be using Hydrofera Blue. She says she has plenty of supplies. She tells me that she is traveling to [place] next week for a 10-day trip. We will see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

## 2020-08-16 ENCOUNTER — Other Ambulatory Visit: Payer: Self-pay | Admitting: Family Medicine

## 2020-08-16 DIAGNOSIS — Z981 Arthrodesis status: Secondary | ICD-10-CM

## 2020-08-16 DIAGNOSIS — M8610 Other acute osteomyelitis, unspecified site: Secondary | ICD-10-CM

## 2020-08-16 DIAGNOSIS — G894 Chronic pain syndrome: Secondary | ICD-10-CM

## 2020-08-16 MED ORDER — HYDROCODONE-ACETAMINOPHEN 5-325 MG PO TABS: 1.0000 | ORAL_TABLET | Freq: Four times a day (QID) | ORAL | 0 refills | Status: DC | PRN

## 2020-08-16 NOTE — Telephone Encounter (Signed)
Requesting: NORCO Contract: 08/16/2017 UDS: 05/06/2018, low risk Last OV: 06/24/20 Next OV: N/A Last Refill: 07/19/20, #120--0 RF Database:   Please advise

## 2020-08-16 NOTE — Telephone Encounter (Signed)
Medication: HYDROcodone-acetaminophen (NORCO/VICODIN) 5-325 MG tablet [[id number]]    Has the patient contacted their pharmacy? Yes -Medication is a controlled substance; Needed to contact PCP   Preferred Pharmacy (with phone number or street name):  -[facility]  [address] [place], [place] [address] Phone: [phone]  Agent: Please be advised that RX refills may take up to 3 business days. We ask that you follow-up with your pharmacy.

## 2020-08-16 NOTE — Addendum Note (Signed)
Addended by: Sanda Linger on: 08/16/2020 11:34 AM   Modules accepted: Orders
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
## 2020-08-24 ENCOUNTER — Ambulatory Visit: Admit: 2020-08-24 | Discharge: 2020-08-25 | Payer: MEDICARE

## 2020-08-24 ENCOUNTER — Encounter: Admit: 2020-08-24 | Discharge: 2020-08-24 | Payer: MEDICARE

## 2020-08-24 DIAGNOSIS — E785 Hyperlipidemia, unspecified: Secondary | ICD-10-CM

## 2020-08-24 DIAGNOSIS — E119 Type 2 diabetes mellitus without complications: Secondary | ICD-10-CM

## 2020-08-24 DIAGNOSIS — M79642 Pain in left hand: Secondary | ICD-10-CM

## 2020-08-24 DIAGNOSIS — G9050 Complex regional pain syndrome I, unspecified: Secondary | ICD-10-CM

## 2020-08-24 DIAGNOSIS — I10 Essential (primary) hypertension: Secondary | ICD-10-CM

## 2020-08-24 DIAGNOSIS — J309 Allergic rhinitis, unspecified: Secondary | ICD-10-CM

## 2020-08-24 DIAGNOSIS — K2970 Gastritis, unspecified, without bleeding: Secondary | ICD-10-CM

## 2020-08-24 DIAGNOSIS — J45909 Unspecified asthma, uncomplicated: Secondary | ICD-10-CM

## 2020-08-24 DIAGNOSIS — G5600 Carpal tunnel syndrome, unspecified upper limb: Secondary | ICD-10-CM

## 2020-08-24 DIAGNOSIS — D649 Anemia, unspecified: Secondary | ICD-10-CM

## 2020-08-24 DIAGNOSIS — K589 Irritable bowel syndrome without diarrhea: Secondary | ICD-10-CM

## 2020-08-24 DIAGNOSIS — Z789 Other specified health status: Secondary | ICD-10-CM

## 2020-08-24 DIAGNOSIS — E559 Vitamin D deficiency, unspecified: Secondary | ICD-10-CM

## 2020-08-24 DIAGNOSIS — K5900 Constipation, unspecified: Secondary | ICD-10-CM

## 2020-08-24 LAB — BASIC METABOLIC PANEL
Lab: 1.5 mg/dL — ABNORMAL HIGH (ref 0.4–1.00)
Lab: 10 mg/dL (ref 8.5–10.6)
Lab: 106 MMOL/L (ref 98–110)
Lab: 133 mg/dL — ABNORMAL HIGH (ref 70–100)
Lab: 143 MMOL/L (ref 137–147)
Lab: 17 10*3/uL — ABNORMAL HIGH (ref 3–12)
Lab: 18 mg/dL (ref 7–25)
Lab: 20 MMOL/L — ABNORMAL LOW (ref 21–30)
Lab: 33 mL/min — ABNORMAL LOW (ref >60)
Lab: 4.3 MMOL/L (ref 3.5–5.1)
Lab: 40 mL/min — ABNORMAL LOW (ref >60)

## 2020-08-24 LAB — CBC AND DIFF
Lab: 0 10*3/uL (ref 0–0.20)
Lab: 0.2 10*3/uL (ref 0–0.45)
Lab: 0.6 10*3/uL (ref 0–0.80)
Lab: 12 g/dL — ABNORMAL HIGH (ref 12.0–15.0)
Lab: 2.3 10*3/uL (ref 1.0–4.8)
Lab: 39 % (ref 36–45)
Lab: 4.6 M/UL (ref 4.0–5.0)
Lab: 5.9 10*3/uL (ref 1.8–7.0)
Lab: 9.3 K/UL (ref 4.5–11.0)

## 2020-08-24 LAB — IRON + BINDING CAPACITY + %SAT+ FERRITIN: Lab: 119 ug/dL (ref 50–160)

## 2020-08-24 MED ORDER — CLONIDINE HCL 0.2 MG PO TAB
.2 mg | ORAL_TABLET | Freq: Three times a day (TID) | ORAL | 3 refills | Status: AC
Start: 2020-08-24 — End: ?

## 2020-08-24 MED ORDER — AZELASTINE 137 MCG (0.1 %) NA SPRA
2 | Freq: Two times a day (BID) | NASAL | 3 refills | 50.00000 days | Status: AC
Start: 2020-08-24 — End: ?

## 2020-08-24 NOTE — Progress Notes
Date of Service: 08/24/2020    Rhonda Fletcher is a 64 y.o. female.  DOB: 11/29/1955  MRN: [id number]     Subjective:             History of Present Illness    Chief Complaint   Patient presents with   ? Follow Up     DM     Last seen in April for a Medicare AWV.  Lab looked good/stable at that time - A1C 6.1%.    Has had problems with pressure in her right ear and ringing in her right ear at night.  No ringing in her left ear.  Does have allergies, not well controlled despite Zyrtec and Flonase.  Discussed a change to azelastine nasal spray.    Exercising at the gym, 4 times a week.  Does a treadmill for 15 minutes, then a crunch or machine, and a twisting machine for trunk.    Sugars higher.  She reports that her diet consists mostly of [place] fries lately.  We discussed that this is not a good food choice for her diabetes.  I have recommended that she meet with a dietitian to discuss better food choices, she is willing to do this.    She reports that her first great grandson was born in July, very premature, only a little over 1 pound when born.  He is still in the neonatal ICU.  His name is [employer].    Her blood pressure is elevated, discussed and will increase clonidine.  She had a friend who recently had a stroke and so she is motivated to get her blood pressure in better control.    Has chronic iron def anemia.  Had iron infusions in Oct, Nov and Dec 2020.     Has diabetes, treated with Metformin and Glimepiride.  A1C in 07/2018 was 7.5%.  A1C in 01/2019 ws higher at 7.7%.  A1C in 07/2019 was improved at 6.4%.  01/2020: A1C 6.1%     Has HTN, treated with Amlodipine and Clonidine.  Has not tolerated ACEI or ARB.     Has Vit D def; taking supplement.     Has microalbuminuria but has not been able to tolerate ACEI or ARB.  Also has not tolerated HCTZ or Spironolactone.     Has reflex symp dystrophy of arms/hands x years, with chronic pain.  Has not tolerated Savella, Gabapentin, or Lyrica in the past. Has GERD, treated with Protonix and Pepcid.     Has hyperlipidemia, diet tx'd.  Has not tolerated statin medications - have tried several.     Has IBS, stable.       Has chronic dermatitis.  Betamethasone diproprionate was too expensive, but she states that betamethason valerate is reasonable in cost.     Has had B12 def; taking supplement.     Has a h/o colon polyp; next colonoscopy due in 2025.    Due:  [employer] booster - today  Flu shot - today  Tdap  Shingrix     Review of Systems      Objective:         ? albuterol sulfate (PROAIR HFA) 90 mcg/actuation HFA aerosol inhaler Inhale two puffs by mouth into the lungs every 6 hours as needed.   ? alcohol swabs (BD SINGLE USE SWABS REGULAR) USE ONE A DAY TO CLEAN SKIN BEFORE FINGERSTICK   ? amLODIPine (NORVASC) 10 mg tablet Take one tablet by mouth daily.   ? azelastine (  ASTELIN) 137 mcg (0.1 %) nasal spray Apply two sprays to each nostril as directed twice daily. Use in each nostril as directed   ? betamethasone valerate (VALISONE) 0.1 % topical cream APPLY  TOPICALLY TO AFFECTED AREA DAILY.   ? betamethasone valerate (VALISONE) 0.1 % topical ointment APPLY  TOPICALLY TO AFFECTED AREA DAILY.   ? Blood Glucose Control, Normal soln Use in glucose meter, Accu-check Aviva   ? blood sugar diagnostic test strip Use one strip as directed daily before breakfast. accucheck aviva test strips DX E11.29   ? 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 cetirizine (ZYRTEC) 10 mg tablet Take 10 mg by mouth daily.     ? cholecalciferol(+) (VITAMIN D-3) 2,000 unit tablet Take 1 Tab by mouth daily.   ? cloNIDine HCL (CATAPRES) 0.2 mg tablet Take one tablet by mouth three times daily.   ? cyclobenzaprine (FLEXERIL) 5 mg tablet TAKE 1 TABLET AT BEDTIME   ? doxycycline (ADOXA) 100 mg tablet Take one tablet by mouth twice daily.   ? famotidine (PEPCID) 20 mg tablet TAKE 1 TABLET TWICE DAILY   ? fluconazole (DIFLUCAN) 150 mg tablet Take one tablet by mouth daily.   ? fluticasone propionate (FLONASE) 50 mcg/actuation nasal spray, suspension Shake bottle gently before using.  Indications: Use 2 SPRAYS IN EACH NOSTRIL AS DIRECTED DAILY   ? glimepiride (AMARYL) 2 mg tablet TAKE 1/2 TABLET EVERY DAY WITH BREAKFAST   ? lancets MISC Use one each as directed daily before breakfast. Diag: E11.29   ? metFORMIN (GLUCOPHAGE) 1,000 mg tablet TAKE 1 TABLET TWICE DAILY WITH MEALS   ? nortriptyline (PAMELOR) 25 mg capsule TAKE 1 CAPSULE BY MOUTH AT BEDTIME   ? pantoprazole DR (PROTONIX) 40 mg tablet TAKE 1 TABLET EVERY DAY   ? traZODone (DESYREL) 50 mg tablet TAKE 1 TABLET AT BEDTIME AS NEEDED FOR SLEEP     Vitals:    08/24/20 0905 08/24/20 0931   BP: (!) 152/89 (!) 155/91   BP Source: Arm, Right Upper Arm, Left Upper   Patient Position: Sitting Sitting   Pulse: 101    SpO2: 100%    Weight: 74 kg (163 lb 3.2 oz)    Height: 166 cm (65.35)    PainSc: Zero      Body mass index is 26.87 kg/m?[name] Kitchen     Physical Exam    Alert, no distress  Ears -TMs are dull bilaterally, no erythema.  External canals are negative.       Assessment and Plan:  [name] was seen today for follow up.    Diagnoses and all orders for this visit:    Type 2 diabetes mellitus with microalbuminuria, without long-term current use of insulin (HCC)  -     BASIC METABOLIC PANEL; Future  -     HEMOGLOBIN A1C; Future  -     INTRACLINIC REFERRAL TO DIETICIAN    Iron deficiency anemia, unspecified iron deficiency anemia type  -     CBC AND DIFF; Future  -     IRON + BINDING CAPACITY + %SAT+ FERRITIN; Future    Essential hypertension    Statin intolerance    Hyperlipidemia, unspecified hyperlipidemia type  -     INTRACLINIC REFERRAL TO DIETICIAN    Other orders  -     FLU VACCINE =>6 MONTHS QUADRIVALENT PF  -     COVID-19 (PFIZER), MRNA, LNP-S, 30 MCG/0.3 ML (PF)  -  azelastine (ASTELIN) 137 mcg (0.1 %) nasal spray; Apply two sprays to each nostril as directed twice daily. Use in each nostril as directed  -     cloNIDine HCL (CATAPRES) 0.2 mg tablet; Take one tablet by mouth three times daily.    Lab work today.  Flu shot given today.  [employer] booster given today.  We will change from Zyrtec to Astelin nasal spray.  Continue Flonase  Increase clonidine to 0.2 mg 3 times daily.  Referred to dietitian for a diabetic diet refresher.  Follow-up in 6 months for an annual wellness visit.    Time spent: 30 min; greater than 50%of time spent in counseling and coordination of care; see above.    Problem   Statin Intolerance    Has tried 4 statins - none tolerated

## 2020-08-24 NOTE — Patient Instructions
Talk with dietician about your diet.    Increase Clonidine to 0.2mg  three times a day for blood pressure    Change from Zyrtec to Azelastine nose spray for ear

## 2020-08-25 DIAGNOSIS — R809 Proteinuria, unspecified: Secondary | ICD-10-CM

## 2020-08-25 DIAGNOSIS — E1129 Type 2 diabetes mellitus with other diabetic kidney complication: Secondary | ICD-10-CM

## 2020-08-25 DIAGNOSIS — D509 Iron deficiency anemia, unspecified: Secondary | ICD-10-CM

## 2020-08-26 ENCOUNTER — Encounter: Admit: 2020-08-26 | Discharge: 2020-08-26 | Payer: MEDICARE

## 2020-08-27 ENCOUNTER — Encounter (HOSPITAL_BASED_OUTPATIENT_CLINIC_OR_DEPARTMENT_OTHER): Payer: PRIVATE HEALTH INSURANCE | Admitting: Internal Medicine

## 2020-08-27 ENCOUNTER — Encounter: Admit: 2020-08-27 | Discharge: 2020-08-27 | Payer: MEDICARE

## 2020-08-27 NOTE — Telephone Encounter
Received referral with a request to schedule nutrition counseling for this patient. Called patient and left message to schedule appointment. Contact information provided. MyChart message sent.  Pearlene Teat, MS RD LD  Voicemail Line [phone]

## 2020-08-31 ENCOUNTER — Other Ambulatory Visit: Payer: Self-pay

## 2020-08-31 ENCOUNTER — Encounter: Payer: Self-pay | Admitting: Internal Medicine

## 2020-08-31 ENCOUNTER — Ambulatory Visit (INDEPENDENT_AMBULATORY_CARE_PROVIDER_SITE_OTHER): Payer: PRIVATE HEALTH INSURANCE | Admitting: Internal Medicine

## 2020-08-31 DIAGNOSIS — M4625 Osteomyelitis of vertebra, thoracolumbar region: Secondary | ICD-10-CM

## 2020-08-31 DIAGNOSIS — L97421 Non-pressure chronic ulcer of left heel and midfoot limited to breakdown of skin: Secondary | ICD-10-CM

## 2020-08-31 NOTE — Progress Notes (Signed)
[facility]  Patient Active Problem List   Diagnosis Date Noted  . Diabetic infection of left foot ([place]) 05/12/2020    Priority: High  . S/P lumbar fusion 03/04/2020    Priority: High  . Osteomyelitis of vertebra of thoracolumbar region [facility]) 01/21/2020    Priority: High  . Diarrhea 09/04/2019    Priority: High  . Ulcer of left heel, limited to breakdown of skin ([place]) 02/01/2017    Priority: High  . Hip pain 05/16/2020  . IDA (iron deficiency anemia) 05/04/2020  . 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 Spinal stenosis of lumbar region 01/22/2020  . Back pain 11/17/2019  . Compression fracture of L1 lumbar vertebra ([place]) 11/17/2019  . Fracture of multiple ribs 11/17/2019  . Intractable nausea and vomiting 11/16/2019  . AKI (acute kidney injury) ([place]) 11/15/2019  . Nausea & vomiting 11/15/2019  . Pressure injury of skin 04/17/2019  . Septic arthritis of elbow, right ([place]) 04/17/2019  . 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 Sleep apnea 11/16/2017  . Diabetic foot infection ([name]) 04/06/2017  . Type 2 diabetes mellitus with hyperglycemia, with long-term current use of insulin ([place]) 04/06/2017  . Neuropathy 05/05/2015  . Diabetic peripheral neuropathy ([place]) 01/12/2015  . CKD (chronic kidney disease), stage III ([place]) 05/27/2014  . History of MI (myocardial infarction) 10/06/2013  . Nausea and vomiting 10/06/2013  . Obesity (BMI 30-39.9) 09/20/2013  . 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 Normocytic anemia 02/03/2013  . CAD (coronary artery disease) 01/11/2012  . Hepatic cirrhosis ([place]) 07/06/2010  . 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  . Hyperlipidemia 06/03/2008  . Essential hypertension 12/23/2006    Patient's Medications  New Prescriptions   No medications on file  Previous Medications   ACETAMINOPHEN (TYLENOL) 325 MG TABLET    Take 2 tablets (650 mg total) by mouth  every 6 (six) hours as needed for fever.   ATORVASTATIN (LIPITOR) 10 MG TABLET    Take 1 tablet by mouth once daily   BETHANECHOL (URECHOLINE) 10 MG TABLET    Take 1 tablet (10 mg total) by mouth 3 (three) times daily.   CIPROFLOXACIN (CIPRO) 500 MG TABLET    Take 500 mg by mouth daily.   CONTINUOUS BLOOD GLUC RECEIVER (FREESTYLE LIBRE 14 DAY READER) DEVI    1 Device by Does not apply route daily.   CONTINUOUS BLOOD GLUC SENSOR (FREESTYLE LIBRE 14 DAY SENSOR) MISC    1 Device by Does not apply route daily.   DIAZEPAM (VALIUM) 5 MG TABLET    Take 1 tablet (5 mg total) by mouth every 12 (twelve) hours as needed for anxiety.   DULOXETINE (CYMBALTA) 60 MG CAPSULE    Take 1 capsule (60 mg total) by mouth daily.   FOLIC ACID (FOLVITE) 1 MG TABLET    Take 2 tablets (2 mg total) by mouth daily.   HUMALOG KWIKPEN 200 UNIT/ML KWIKPEN    15 units sub-q twice daily   HYDROCODONE-ACETAMINOPHEN (NORCO/VICODIN) 5-325 MG TABLET    Take 1 tablet by mouth every 6 (six) hours as needed for moderate pain.   LIDOCAINE (LIDODERM) 5 %    Place 1 patch onto the skin daily. Remove & Discard patch within 12 hours or as directed by MD   LISINOPRIL (ZESTRIL) 10 MG TABLET    Take 10 mg by mouth daily.   MULTIPLE VITAMIN (MULTIVITAMIN WITH MINERALS) TABS TABLET  Take 1 tablet by mouth daily.   OXYCODONE (OXY IR/ROXICODONE) 5 MG IMMEDIATE RELEASE TABLET    Take 1 tablet (5 mg total) by mouth every 3 (three) hours as needed for moderate pain ((score 4 to 6)).   PANTOPRAZOLE (PROTONIX) 40 MG TABLET    Take 1 tablet (40 mg total) by mouth daily at 6 (six) AM.   PREGABALIN (LYRICA) 75 MG CAPSULE    Take 1 capsule (75 mg total) by mouth 3 (three) times daily.   RIVAROXABAN (XARELTO) 15 MG TABS TABLET    Take 1 tablet (15 mg total) by mouth daily with supper.  Modified Medications   No medications on file  Discontinued Medications   METRONIDAZOLE (FLAGYL) 500 MG TABLET    Take 1 tablet (500 mg total) by mouth 3 (three) times  daily.    Subjective: Wenona is in with her husband for her follow-up visit.  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  She received long courses of IV antibiotics throughout the last 6 months of last year.  When hospitalized in January she was switched to oral doxycycline for chronic suppression.  She missed her follow-up visit with me in February. Unbeknownst to me she could not tolerate doxycycline and stopped it shortly after discharge in January. She has also been treated for polymicrobial left heel infection.  Previous cultures have grown Enterococcus, Proteus, Klebsiella and Pseudomonas.  She began having severe low back pain last month the pain has become progressively severe and she has not been able to get out of bed.  As a result she has missed many doctor appointments.  She came to the ED 01/20/2020.  MRI revealed:  IMPRESSION: Destruction of the L1 vertebral body with partial destruction of the inferior endplates of N62 and L2 with abnormal enhancing paraspinal and epidural soft tissues at that level. No definable abscess. I suspect this represents atypical infection such as TB or actinomycosis. B-cell lymphoma can also give this appearance. There is moderate compression of the thecal sac at L1 as described above.  She underwent operative debridement and T11-L3 fusion by Dr. Duffy Rhody.  Operative cultures grew Pseudomonas.  She was started on IV cefepime.  Her hospital course was complicated by acute kidney injury requiring intermittent hemodialysis.  Her cefepime was changed to meropenem.  Her pain improved after her surgery.  She received a total of 29 days of IV antibiotic therapy before she decided to leave the hospital [place] on 02/26/2020.  She was not on any antibiotic therapy until her PCP, Dr. Rossie Muskrat, started her on ciprofloxacin on 03/02/2020.    She developed watery diarrhea and some nausea and vomiting.  As  a result she stopped taking ciprofloxacin on 03/31/2020.  I ordered C. difficile testing when I last saw her on 04/05/2020 but her diarrhea stopped and no sample was ever brought in.  She called on 04/19/2020 and informed us that she had developed recurrent foul-smelling drainage from her left heel.    She restarted ciprofloxacin and metronidazole in July.  She has had worsening dysphagia with pills and solid food.  She has had problems swallowing metronidazole in particular.  She says that it seems to get stuck in her throat and leaves a very bad taste.  She stopped taking it several weeks ago.  She continues to take ciprofloxacin she has not had any recurrent diarrhea.  She continues to have intermittent drainage from her left heel  and it appears that Dr. Dellia Nims plans on putting her in a full contact cast soon.  Her back pain continues to improve.  Review of Systems: Review of Systems  Constitutional: Positive for malaise/fatigue. Negative for chills, diaphoresis, fever and weight loss.  Respiratory: Negative for cough, sputum production and shortness of breath.   Cardiovascular: Negative for chest pain.  Gastrointestinal: Negative for abdominal pain, diarrhea, nausea and vomiting.  Genitourinary: Negative for dysuria.  Musculoskeletal: Positive for back pain and joint pain.  Skin: Negative for rash.  Neurological: Negative for headaches.    Past Medical History:  Diagnosis Date  . Acute MI [facility]) 2007   presented to ED & had cardiac cath- but found to have normal coronaries. Since that point in time her PCP cares f or cardiac needs. Dr. [name] - [facility]  . Anemia   . Anginal pain ([place])   . Anxiety   . Asthma   . Bulging lumbar disc   . Cataract   . 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 Cirrhosis of liver without mention of alcohol   . Constipation   . Dehiscence of closure of skin    left partial calcaneal excision  .  Depression   . Diabetes mellitus    insulin dependent, adult onset  . Episode of visual loss of left eye   . Exertional shortness of breath   . Fatty liver   . Fibromyalgia   . GERD (gastroesophageal reflux disease)   . Hepatic steatosis   . High cholesterol   . Hypertension   . MRSA (methicillin resistant Staphylococcus aureus)   . Neuropathy    lower legs  . Osteoarthritis    hands, hips  . Proximal humerus fracture 10/15/12   Left  . PTSD (post-traumatic stress disorder)   . Renal insufficiency 05/05/2015    Social History   Tobacco Use  . Smoking status: Never Smoker  . Smokeless tobacco: Never Used  Vaping Use  . Vaping Use: Never used  Substance Use Topics  . Alcohol use: No    Alcohol/week: 0.0 standard drinks  . Drug use: No    Family History  Problem Relation Age of Onset  . Heart disease Father   . Diabetes Father   . Colitis Father   . Crohn's disease Father   . Cancer Father        leukemia  . Leukemia Father   . Diabetes Mellitus II Brother   . Kidney disease Brother   . Heart disease Brother   . Diabetes Mother   . Hypertension Mother   . Mental illness Mother   . Irritable bowel syndrome Daughter   . Diabetes Mellitus II Brother   . Kidney disease Brother   . Liver disease Brother   . Kidney disease Brother   . Heart attack Brother   . Diabetes Mellitus II Brother   . Heart disease Brother   . Liver disease Brother   . Kidney disease Brother   . Kidney disease Brother   . Diabetes Mellitus II Brother   . Diabetes Mellitus I Brother     Allergies  Allergen Reactions  . Bee Pollen Anaphylaxis  . Fish-Derived Products Hives, Shortness Of Breath, Swelling and Rash    Hives get in throat causing trouble breathing  . Mushroom Extract Complex Anaphylaxis  . Penicillins Anaphylaxis    **Tolerated cefepime March 2021 Did it involve swelling of the  face/tongue/throat, SOB, or low BP? Yes Did it involve sudden or severe rash/hives, skin  peeling, or any reaction on the inside of your mouth or nose? No Did you need to seek medical attention at a hospital or doctor's office? Yes When did it last happen?A few months ago If all above answers are "NO", may proceed with cephalosporin use.  [name] Kitchen Rosemary Oil Anaphylaxis  . Shellfish Allergy Hives, Shortness Of Breath, Swelling and Rash  . Tomato Hives and Shortness Of Breath    Hives in throat causes her trouble breathing  . Acetaminophen Other (See Comments)    GI upset  . Aloe Vera Hives  . Broccoli [Brassica Oleracea] Hives  . Acyclovir And Related Other (See Comments)    Unknown reaction  . Naproxen Other (See Comments)    Unknown reaction    Objective: Vitals:   08/31/20 1623  SpO2: 99%   There is no height or weight on file to calculate BMI. She is seated in her motorized scooter.     All back incisions have healed nicely.  I do not detect any asymmetry of her lower back although she is tender to palpation just to the right of midline. The right elbow has healed with scarring.  There is no evidence of active infection   Lab Results Sed Rate  Date Value  06/15/2020 104 mm/h (H)  05/12/2020 119 mm/h (H)  04/05/2020 CANCELED   CRP (mg/L)  Date Value  06/15/2020 8.8 (H)  05/12/2020 21.4 (H)  04/05/2020 63.4 (H)    Sed Rate  Date Value  06/15/2020 104 mm/h (H)  05/12/2020 119 mm/h (H)  04/05/2020 CANCELED   CRP (mg/L)  Date Value  06/15/2020 8.8 (H)  05/12/2020 21.4 (H)  04/05/2020 63.4 (H)     Problem List Items Addressed This Visit      High   Ulcer of left heel, limited to breakdown of skin ([place])    Dr. Trellis Moment last note indicated that there was no clear evidence of infection.      Osteomyelitis of vertebra of thoracolumbar region [facility])    Her back pain is improving but her inflammatory markers remain elevated 3 months ago.  I had another discussion reminding her that there is only 1 way to determine if her back infection has been  cured.  That would be to stop ciprofloxacin and wait several months to see how she does.  I told her that there is some risk of significant side effects with long-term ciprofloxacin but she prefers to continue it as she is not willing to take much risk of relapse of her vertebral infection.  She will continue ciprofloxacin and follow-up in 3 months.          Michel Bickers, MD [facility] [phone] pager   [phone] cell 08/31/2020, 4:49 PM

## 2020-08-31 NOTE — Assessment & Plan Note (Signed)
Her back pain is improving but her inflammatory markers remain elevated 3 months ago.  I had another discussion reminding her that there is only 1 way to determine if her back infection has been cured.  That would be to stop ciprofloxacin and wait several months to see how she does.  I told her that there is some risk of significant side effects with long-term ciprofloxacin but she prefers to continue it as she is not willing to take much risk of relapse of her vertebral infection.  She will continue ciprofloxacin and follow-up in 3 months.

## 2020-08-31 NOTE — Assessment & Plan Note (Signed)
Dr. Trellis Moment last note indicated that there was no clear evidence of infection.

## 2020-09-12 ENCOUNTER — Other Ambulatory Visit: Payer: Self-pay | Admitting: Family Medicine

## 2020-09-12 DIAGNOSIS — B354 Tinea corporis: Secondary | ICD-10-CM

## 2020-09-13 ENCOUNTER — Other Ambulatory Visit: Payer: Self-pay

## 2020-09-13 DIAGNOSIS — M8610 Other acute osteomyelitis, unspecified site: Secondary | ICD-10-CM

## 2020-09-13 DIAGNOSIS — G894 Chronic pain syndrome: Secondary | ICD-10-CM

## 2020-09-13 DIAGNOSIS — Z981 Arthrodesis status: Secondary | ICD-10-CM

## 2020-09-13 MED ORDER — HYDROCODONE-ACETAMINOPHEN 5-325 MG PO TABS: 1.0000 | ORAL_TABLET | Freq: Four times a day (QID) | ORAL | 0 refills | Status: DC | PRN

## 2020-09-13 NOTE — Telephone Encounter (Signed)
Requesting: NORCO Contract: 08/16/2017 UDS: 05/06/2018 Last OV: 06/24/20 Next OV: N/A Last Refill: 08/16/2020, #[id number] RF  Database:   Please advise

## 2020-09-13 NOTE — Telephone Encounter (Signed)
Patient called requesting a refill on hydrocodone-acetaminophen 5-325 MG tabs.  Pharm[facility] is [facility]

## 2020-09-14 ENCOUNTER — Other Ambulatory Visit: Payer: Self-pay

## 2020-09-14 ENCOUNTER — Inpatient Hospital Stay (HOSPITAL_BASED_OUTPATIENT_CLINIC_OR_DEPARTMENT_OTHER): Payer: PRIVATE HEALTH INSURANCE | Admitting: Family

## 2020-09-14 ENCOUNTER — Telehealth: Payer: Self-pay

## 2020-09-14 ENCOUNTER — Inpatient Hospital Stay: Payer: PRIVATE HEALTH INSURANCE | Attending: Hematology & Oncology

## 2020-09-14 ENCOUNTER — Encounter: Payer: Self-pay | Admitting: Family

## 2020-09-14 VITALS — BP 151/67 | HR 75 | Temp 98.3°F | Resp 18 | Ht 62.0 in

## 2020-09-14 DIAGNOSIS — E7211 Homocystinuria: Secondary | ICD-10-CM

## 2020-09-14 DIAGNOSIS — Z7901 Long term (current) use of anticoagulants: Secondary | ICD-10-CM | POA: Insufficient documentation

## 2020-09-14 DIAGNOSIS — D500 Iron deficiency anemia secondary to blood loss (chronic): Secondary | ICD-10-CM

## 2020-09-14 DIAGNOSIS — I82503 Chronic embolism and thrombosis of unspecified deep veins of lower extremity, bilateral: Secondary | ICD-10-CM

## 2020-09-14 DIAGNOSIS — Z86718 Personal history of other venous thrombosis and embolism: Secondary | ICD-10-CM | POA: Diagnosis not present

## 2020-09-14 DIAGNOSIS — D509 Iron deficiency anemia, unspecified: Secondary | ICD-10-CM | POA: Diagnosis not present

## 2020-09-14 DIAGNOSIS — D6859 Other primary thrombophilia: Secondary | ICD-10-CM | POA: Diagnosis not present

## 2020-09-14 DIAGNOSIS — Z79899 Other long term (current) drug therapy: Secondary | ICD-10-CM | POA: Diagnosis not present

## 2020-09-14 DIAGNOSIS — E538 Deficiency of other specified B group vitamins: Secondary | ICD-10-CM

## 2020-09-14 LAB — CBC WITH DIFFERENTIAL (CANCER CENTER ONLY)
Abs Immature Granulocytes: 0.02 10*3/uL (ref 0.00–0.07)
Basophils Absolute: 0 10*3/uL (ref 0.0–0.1)
Basophils Relative: 1 %
Eosinophils Absolute: 0.1 10*3/uL (ref 0.0–0.5)
Eosinophils Relative: 2 %
HCT: 31.4 % — ABNORMAL LOW (ref 36.0–46.0)
Hemoglobin: 10 g/dL — ABNORMAL LOW (ref 12.0–15.0)
Immature Granulocytes: 0 %
Lymphocytes Relative: 17 %
Lymphs Abs: 1 10*3/uL (ref 0.7–4.0)
MCH: 28.1 pg (ref 26.0–34.0)
MCHC: 31.8 g/dL (ref 30.0–36.0)
MCV: 88.2 fL (ref 80.0–100.0)
Monocytes Absolute: 0.4 10*3/uL (ref 0.1–1.0)
Monocytes Relative: 7 %
Neutro Abs: 4.3 10*3/uL (ref 1.7–7.7)
Neutrophils Relative %: 73 %
Platelet Count: 180 10*3/uL (ref 150–400)
RBC: 3.56 MIL/uL — ABNORMAL LOW (ref 3.87–5.11)
RDW: 13.7 % (ref 11.5–15.5)
WBC Count: 5.9 10*3/uL (ref 4.0–10.5)
nRBC: 0 % (ref 0.0–0.2)

## 2020-09-14 LAB — CMP (CANCER CENTER ONLY)
ALT: 5 U/L (ref 0–44)
AST: 11 U/L — ABNORMAL LOW (ref 15–41)
Albumin: 3.3 g/dL — ABNORMAL LOW (ref 3.5–5.0)
Alkaline Phosphatase: 94 U/L (ref 38–126)
Anion gap: 5 (ref 5–15)
BUN: 37 mg/dL — ABNORMAL HIGH (ref 8–23)
CO2: 27 mmol/L (ref 22–32)
Calcium: 9.4 mg/dL (ref 8.9–10.3)
Chloride: 99 mmol/L (ref 98–111)
Creatinine: 2.11 mg/dL — ABNORMAL HIGH (ref 0.44–1.00)
GFR, Estimated: 26 mL/min — ABNORMAL LOW (ref >60)
Glucose, Bld: 335 mg/dL — ABNORMAL HIGH (ref 70–99)
Potassium: 4.8 mmol/L (ref 3.5–5.1)
Sodium: 131 mmol/L — ABNORMAL LOW (ref 135–145)
Total Bilirubin: 0.3 mg/dL (ref 0.3–1.2)
Total Protein: 7.8 g/dL (ref 6.5–8.1)

## 2020-09-14 LAB — D-DIMER, QUANTITATIVE: D-Dimer, Quant: 2.24 ug/mL-FEU — ABNORMAL HIGH (ref 0.00–0.50)

## 2020-09-14 LAB — VITAMIN B12: Vitamin B-12: 292 pg/mL (ref 180–914)

## 2020-09-14 NOTE — Telephone Encounter (Signed)
Called and left a message with 09/14/20 LOS appts.... AOM

## 2020-09-14 NOTE — Telephone Encounter (Signed)
Pt had called needing to verify todays appts, pt has been called and I left a vm for her... aom

## 2020-09-14 NOTE — Progress Notes (Signed)
Hematology and Oncology Follow Up Visit  [name] [name] [id number] 1956-01-17 64 y.o. 09/14/2020   Principle Diagnosis:  Acute DVT ofboth the right and left peroneal and posterior tibial veins Hyperhomocystinemia  Iron deficiency anemia   Current Therapy: Xarelto 15 mg PO daily Folic acid 2 mg PO daily IV iron as indicated   Interim History:  [name] is here today for follow-up. She is symptomatic with fatigue.  Her blood glucose is 335 and she states that she forgot to take her insulin today. She states that she is under some stress at home and sometimes she eats cake and other sugary treats to cope. Hgb A1c in August was 8.1.  She has neuropathy in her hands and feet which is unchanged.  She has swelling and tenderness in her left foot due to her healing ulcer. Wound care continues to keep this clean and bandaged. She has an ankle boot on today and states that they plan to put a cast on next week.  She has intermittent swelling behind her knees. No swelling noted at this time. Pedal pulses are 2+.  She also states that she has swelling in her back and right side that comes and goes since having the infection in her back.  Homocystine level last month was 18.5, anticardiolipin IgG level was 22 and D-dimer 1.64.  No fever, chills, n/v, cough, dizziness, SOB, palpitations, abdominal pain or changes in bowel or bladder habits.  She has occasional episodes of chest pain and states that her PCP is aware and plans to refer her to cardiology.  She is also seeing dermatology for a rash on her skin effecting her head, face, shoulders and breast. It is itchy. There is no visible red or raised rash at this time. She states that it is felt to be bacterial.  She has had HTN and states that she has had a couple of nose bleeds that she was able to stop on her own with a cold rag. No other blood loss noted.  No petechiae.  Platelets 180, Hgb 10.0 and WBC count 5.9.  No falls or syncopal  episodes to report.  Appetite comes and goes. She is doing her best to stay hydrated.  ECOG Performance Status: 2 - Symptomatic, <50% confined to bed  Medications:  Allergies as of 09/14/2020      Reactions   Bee Pollen Anaphylaxis   Fish-derived Products Hives, Shortness Of Breath, Swelling, Rash   Hives get in throat causing trouble breathing   Mushroom Extract Complex Anaphylaxis   Penicillins Anaphylaxis   **Tolerated cefepime March 2021 Did it involve swelling of the face/tongue/throat, SOB, or low BP? Yes Did it involve sudden or severe rash/hives, skin peeling, or any reaction on the inside of your mouth or nose? No Did you need to seek medical attention at a hospital or doctor's office? Yes When did it last happen?A few months ago If all above answers are "NO", may proceed with cephalosporin use.   Rosemary Oil Anaphylaxis   Shellfish Allergy Hives, Shortness Of Breath, Swelling, Rash   Tomato Hives, Shortness Of Breath   Hives in throat causes her trouble breathing   Acetaminophen Other (See Comments)   GI upset   Aloe Vera Hives   Broccoli [brassica Oleracea] Hives   Acyclovir And Related Other (See Comments)   Unknown reaction   Naproxen Other (See Comments)   Unknown reaction      Medication List       Accurate as of  September 14, 2020  3:12 PM. If you have any questions, ask your nurse or doctor.        acetaminophen 325 MG tablet Commonly known as: TYLENOL Take 2 tablets (650 mg total) by mouth every 6 (six) hours as needed for fever.   atorvastatin 10 MG tablet Commonly known as: LIPITOR Take 1 tablet by mouth once daily   bethanechol 10 MG tablet Commonly known as: URECHOLINE Take 1 tablet (10 mg total) by mouth 3 (three) times daily.   ciprofloxacin 500 MG tablet Commonly known as: CIPRO Take 500 mg by mouth daily.   diazepam 5 MG tablet Commonly known as: VALIUM Take 1 tablet (5 mg total) by mouth every 12 (twelve) hours as needed for  anxiety.   DULoxetine 60 MG capsule Commonly known as: Cymbalta Take 1 capsule (60 mg total) by mouth daily.   folic acid 1 MG tablet Commonly known as: FOLVITE Take 2 tablets (2 mg total) by mouth daily.   FreeStyle Libre 14 Day Reader Kerrin Mo 1 Device by Does not apply route daily.   FreeStyle Libre 14 Day Sensor Misc 1 Device by Does not apply route daily.   HumaLOG KwikPen 200 UNIT/ML KwikPen Generic drug: insulin lispro 15 units sub-q twice daily   HYDROcodone-acetaminophen 5-325 MG tablet Commonly known as: NORCO/VICODIN Take 1 tablet by mouth every 6 (six) hours as needed for moderate pain.   lidocaine 5 % Commonly known as: Lidoderm Place 1 patch onto the skin daily. Remove & Discard patch within 12 hours or as directed by MD   lisinopril 10 MG tablet Commonly known as: ZESTRIL Take 10 mg by mouth daily.   multivitamin with minerals Tabs tablet Take 1 tablet by mouth daily.   nystatin powder Generic drug: nystatin APPLY  POWDER TOPICALLY THREE TIMES DAILY   oxyCODONE 5 MG immediate release tablet Commonly known as: Oxy IR/ROXICODONE Take 1 tablet (5 mg total) by mouth every 3 (three) hours as needed for moderate pain ((score 4 to 6)).   pantoprazole 40 MG tablet Commonly known as: PROTONIX Take 1 tablet (40 mg total) by mouth daily at 6 (six) AM.   pregabalin 75 MG capsule Commonly known as: LYRICA Take 1 capsule (75 mg total) by mouth 3 (three) times daily.   Rivaroxaban 15 MG Tabs tablet Commonly known as: XARELTO Take 1 tablet (15 mg total) by mouth daily with supper.       Allergies:  Allergies  Allergen Reactions   Bee Pollen Anaphylaxis   Fish-Derived Products Hives, Shortness Of Breath, Swelling and Rash    Hives get in throat causing trouble breathing   Mushroom Extract Complex Anaphylaxis   Penicillins Anaphylaxis    **Tolerated cefepime March 2021 Did it involve swelling of the face/tongue/throat, SOB, or low BP? Yes Did it  involve sudden or severe rash/hives, skin peeling, or any reaction on the inside of your mouth or nose? No Did you need to seek medical attention at a hospital or doctor's office? Yes When did it last happen?A few months ago If all above answers are "NO", may proceed with cephalosporin use.   Rosemary Oil Anaphylaxis   Shellfish Allergy Hives, Shortness Of Breath, Swelling and Rash   Tomato Hives and Shortness Of Breath    Hives in throat causes her trouble breathing   Acetaminophen Other (See Comments)    GI upset   Aloe Vera Hives   Broccoli [Brassica Oleracea] Hives   Acyclovir And Related Other (See Comments)  Unknown reaction   Naproxen Other (See Comments)    Unknown reaction    Past Medical History, Surgical history, Social history, and Family History were reviewed and updated.  Review of Systems: All other 10 point review of systems is negative.   Physical Exam:  vitals were not taken for this visit.   Wt Readings from Last 3 Encounters:  06/15/20 176 lb (79.8 kg)  02/26/20 187 lb 13.3 oz (85.2 kg)  02/18/20 190 lb 9.6 oz (86.5 kg)    Ocular: Sclerae unicteric, pupils equal, round and reactive to light Ear-nose-throat: Oropharynx clear, dentition fair Lymphatic: No cervical or supraclavicular adenopathy Lungs no rales or rhonchi, good excursion bilaterally Heart regular rate and rhythm, no murmur appreciated Abd soft, nontender, positive bowel sounds MSK no focal spinal tenderness, no joint edema Neuro: non-focal, well-oriented, appropriate affect Breasts: Deferred   Lab Results  Component Value Date   WBC 5.9 09/14/2020   HGB 10.0 (L) 09/14/2020   HCT 31.4 (L) 09/14/2020   MCV 88.2 09/14/2020   PLT 180 09/14/2020   Lab Results  Component Value Date   FERRITIN 490 (H) 08/03/2020   IRON 42 08/03/2020   TIBC 171 (L) 08/03/2020   UIBC 130 08/03/2020   IRONPCTSAT 24 08/03/2020   Lab Results  Component Value Date   RETICCTPCT 2.8 04/21/2020     RBC 3.56 (L) 09/14/2020   No results found for: KPAFRELGTCHN, LAMBDASER, KAPLAMBRATIO No results found for: IGGSERUM, IGA, IGMSERUM No results found for: TOTALPROTELP, ALBUMINELP, A1GS, A2GS, BETS, BETA2SER, GAMS, MSPIKE, SPEI   Chemistry      Component Value Date/Time   NA 131 (L) 09/14/2020 1309   K 4.8 09/14/2020 1309   CL 99 09/14/2020 1309   CO2 27 09/14/2020 1309   BUN 37 (H) 09/14/2020 1309   CREATININE 2.11 (H) 09/14/2020 1309   CREATININE 1.97 (H) 06/24/2020 1431      Component Value Date/Time   CALCIUM 9.4 09/14/2020 1309   ALKPHOS 94 09/14/2020 1309   AST 11 (L) 09/14/2020 1309   ALT 5 09/14/2020 1309   BILITOT 0.3 09/14/2020 1309       Impression and Plan: Ms. Belmar is a very pleasant 64 yo caucasian female withlong standing history of anemia as well as recent diagnosis of DVT's in both the right and left peroneal and posterior tibial veins while she was bed bound in the hospital being treated for osteomyelitis. She had an elevated homocystine level, mildly elevatedanticardiolipin antibody IgG level and elevated D-dimer at that time.  She will continue her same regimen with Xarelto as well as folic acid. Iron studies are pending. We will replace if needed.  Follow-up in 8 weeks.  She was encouraged to contact our office with any questions or concerns.   Laverna Peace, NP 11/16/20213:12 PM

## 2020-09-15 ENCOUNTER — Encounter: Admit: 2020-09-15 | Discharge: 2020-09-15 | Payer: MEDICARE

## 2020-09-15 DIAGNOSIS — Z9189 Other specified personal risk factors, not elsewhere classified: Secondary | ICD-10-CM

## 2020-09-15 LAB — FERRITIN: Ferritin: 467 ng/mL — ABNORMAL HIGH (ref 11–307)

## 2020-09-15 LAB — IRON AND TIBC
Iron: 41 ug/dL (ref 41–142)
Saturation Ratios: 18 % — ABNORMAL LOW (ref 21–57)
TIBC: 223 ug/dL — ABNORMAL LOW (ref 236–444)
UIBC: 182 ug/dL (ref 120–384)

## 2020-09-15 LAB — HOMOCYSTEINE: Homocysteine: 18.4 umol/L — ABNORMAL HIGH (ref 0.0–17.2)

## 2020-09-16 ENCOUNTER — Telehealth: Payer: Self-pay

## 2020-09-16 LAB — CARDIOLIPIN ANTIBODIES, IGG, IGM, IGA
Anticardiolipin IgA: <9 APL U/mL (ref 0–11)
Anticardiolipin IgG: <9 GPL U/mL (ref 0–14)
Anticardiolipin IgM: 9 MPL U/mL (ref 0–12)

## 2020-09-16 NOTE — Telephone Encounter (Signed)
Called and left vm per inbasket message to sch iv iron x 3 doses... AOM

## 2020-09-17 ENCOUNTER — Encounter: Admit: 2020-09-17 | Discharge: 2020-09-17 | Payer: MEDICARE

## 2020-09-17 NOTE — Telephone Encounter
Attempted to contact Ella Bodo to schedule an appointment with the primary care pharmacist per their primary care provider's request. Left voicemail asking patient to contact us at [phone] to set up an appointment.    [name]

## 2020-09-20 ENCOUNTER — Telehealth: Payer: Self-pay

## 2020-09-20 NOTE — Telephone Encounter (Signed)
S/W pt and she is awar of her iron tx per inbasket message.... AOM

## 2020-09-21 ENCOUNTER — Encounter: Admit: 2020-09-21 | Discharge: 2020-09-21 | Payer: MEDICARE

## 2020-09-21 NOTE — Telephone Encounter
Attempted to contact Rhonda Fletcher to schedule an appointment with the primary care pharmacist per their primary care provider's request. Left voicemail and sent MyChart message  asking patient to contact us at [phone] to set up an appointment.    [name]

## 2020-09-24 ENCOUNTER — Encounter: Admit: 2020-09-24 | Discharge: 2020-09-24 | Payer: MEDICARE

## 2020-09-24 DIAGNOSIS — E1129 Type 2 diabetes mellitus with other diabetic kidney complication: Secondary | ICD-10-CM

## 2020-09-24 MED ORDER — BETAMETHASONE VALERATE 0.1 % TP CREA
Freq: Every day | 1 refills
Start: 2020-09-24 — End: ?

## 2020-09-24 MED ORDER — ACCU-CHEK AVIVA PLUS TEST STRP MISC STRP
ORAL_STRIP | Freq: Every day | 0 refills
Start: 2020-09-24 — End: ?

## 2020-09-27 ENCOUNTER — Encounter: Admit: 2020-09-27 | Discharge: 2020-09-27 | Payer: MEDICARE

## 2020-09-28 ENCOUNTER — Inpatient Hospital Stay: Payer: PRIVATE HEALTH INSURANCE

## 2020-09-28 ENCOUNTER — Other Ambulatory Visit: Payer: Self-pay

## 2020-09-28 ENCOUNTER — Ambulatory Visit: Admit: 2020-09-28 | Discharge: 2020-09-29 | Payer: MEDICARE

## 2020-09-28 VITALS — BP 172/64 | HR 80 | Temp 98.9°F | Resp 18

## 2020-09-28 DIAGNOSIS — D509 Iron deficiency anemia, unspecified: Secondary | ICD-10-CM | POA: Diagnosis not present

## 2020-09-28 DIAGNOSIS — D508 Other iron deficiency anemias: Secondary | ICD-10-CM

## 2020-09-28 MED ORDER — SODIUM CHLORIDE 0.9 % IV SOLN
Freq: Once | INTRAVENOUS | Status: AC
  Filled 2020-09-28: qty 250

## 2020-09-28 MED ORDER — SODIUM CHLORIDE 0.9 % IV SOLN
200.0000 mg | Freq: Once | INTRAVENOUS | Status: AC
  Administered 2020-09-28: 200 mg via INTRAVENOUS
  Filled 2020-09-28: qty 200

## 2020-09-28 NOTE — Patient Instructions (Signed)

## 2020-09-28 NOTE — Progress Notes (Signed)
1350, patient started complaining of dizziness. Iron infusion stopped. NS started and placed in supine position, BP 158/70. States she hasnt eaten much today and her blood sugars do get out of range sometimes.  1355 Provided graham crackers and ginger ale. Patient states she is feeling better.   Iron infusion was already completed when patient stated she was feeling dizzy. Patient will stay for post observation period today with hydration   Pt discharged in no apparent distress at time of discharge. VS WNL, patient states she feels a lot better.  Pt left via motorized wheelchair . Pt aware of discharge instructions and verbalized understanding and had no further questions.

## 2020-09-28 NOTE — Progress Notes
An initial comprehensive medication management visit was completed today via telephone.    Referral reason: Medication Adherence  Referring provider: Dr. Johny Shears    Assessment & Plan     Hypertension  Patient's blood pressure is uncontrolled per clinic readings. Their goal blood pressure is < 130/80 mmHg    - Current regimen: amlodipine 10mg  daily, clonidine 0.2mg  TID (was increased from BID following last PCP appnt)   - Midday clonidine dose is causing significant drowsiness, has to lay down after taking it. This has limited her ability to adhere to TID dosing based on what she has to do that day   - Daughter gave her BP cuff recently, has not used yet    Plan  Check blood pressure 2 time(s) per day.  - continue BP regimen as prescribed, checking BP 2x/day with focus on checking when she feels fatigued/tired to rule out hypotension from increased clonidine dose  - will follow up in 2 weeks to review BP readings and assess regimen accordingly     Comprehensive Medication Review   - no medication related problems identified besides fatigue from clonidine (see above)   - no adherence gaps noted       Plan  - medication list updated  - outside medication reconciled      Follow-up  The patient will continue to follow up with the pharmacist. Return to pharmacist in 2 weeks via telephone. The return visit was scheduled during today's visit.    Subjective & Objective       Adherence  Refill and adherence history were reviewed with the patient. The patient was educated on the importance of adherence.    Patient is adherent with refills: yes  Patient is meeting refill adherence goal: yes  Patient is meeting reported adherence goal: yes  The missed doses were considered insignificant.    Adherence support provided: n/a    90-day supply of maintenance medication(s): yes  Affordability concern: no    Adverse Effects  Adverse effects were reviewed with the patient.    Significant adverse effect(s) were identified  see above (clonidine)    Laboratory Values  BP Readings from Last 3 Encounters:  08/24/20 : (!) 155/91  02/23/20 : (!) 143/77  10/14/19 : (!) (P) 153/72      Medication History  Medication history was completed and the patient's medication list was updated to reflect what they are currently taking.    Drug-drug interactions were evaluated. There were not clinically significant drug-drug interactions.    Drug-food interactions were evaluated. There were not clinically significant drug-food interactions.    The patient is filling their medications through one pharmacy.    Refills needed: no    Home Medications    Medication Sig   ACCU-CHEK AVIVA PLUS TEST STRP test strip TEST EVERY DAY BEFORE BREAKFAST AS DIRECTED   albuterol sulfate (PROAIR HFA) 90 mcg/actuation HFA aerosol inhaler Inhale two puffs by mouth into the lungs every 6 hours as needed.   alcohol swabs (BD SINGLE USE SWABS REGULAR) USE ONE A DAY TO CLEAN SKIN BEFORE FINGERSTICK   amLODIPine (NORVASC) 10 mg tablet Take one tablet by mouth daily.   betamethasone valerate (VALISONE) 0.1 % topical cream APPLY  TOPICALLY TO AFFECTED AREA DAILY.   betamethasone valerate (VALISONE) 0.1 % topical ointment APPLY  TOPICALLY TO AFFECTED AREA DAILY.   Blood Glucose Control, Normal soln Use in glucose meter, Accu-check Aviva   Blood-Glucose Meter kit Use as directed to check blood sugars daily  before breakfast DX E11.29   cetirizine (ZYRTEC) 10 mg tablet Take 10 mg by mouth daily.     cholecalciferol(+) (VITAMIN D-3) 2,000 unit tablet Take 1 Tab by mouth daily.   cloNIDine HCL (CATAPRES) 0.2 mg tablet Take one tablet by mouth three times daily.   cyanocobalamin (VITAMIN B-12) 500 mcg tablet Take 500 mcg by mouth daily.   cyclobenzaprine (FLEXERIL) 5 mg tablet TAKE 1 TABLET AT BEDTIME  Patient taking differently: Sometimes takes twice daily   famotidine (PEPCID) 20 mg tablet TAKE 1 TABLET TWICE DAILY  Patient taking differently: Take 20 mg by mouth daily.   ferrous sulfate (FEOSOL) 325 mg (65 mg iron) tablet Take 325 mg by mouth every 48 hours.   fluconazole (DIFLUCAN) 150 mg tablet Take one tablet by mouth daily.   fluticasone propionate (FLONASE) 50 mcg/actuation nasal spray, suspension Shake bottle gently before using.  Indications: Use 2 SPRAYS IN EACH NOSTRIL AS DIRECTED DAILY   glimepiride (AMARYL) 2 mg tablet TAKE 1/2 TABLET EVERY DAY WITH BREAKFAST  Patient taking differently: Take 2 mg by mouth daily.   lancets MISC Use one each as directed daily before breakfast. Diag: E11.29   metFORMIN (GLUCOPHAGE) 1,000 mg tablet TAKE 1 TABLET TWICE DAILY WITH MEALS   pantoprazole DR (PROTONIX) 40 mg tablet TAKE 1 TABLET EVERY DAY      Education  Education provided: yes    Hypertension: encouraged patient to monitor BP at home    Time spent with patient: 20 minutes    Rhonda Fletcher, PharmD, BCPS   Clinical Pharmacist - [facility]   [facility]  [phone]

## 2020-09-29 DIAGNOSIS — Z9189 Other specified personal risk factors, not elsewhere classified: Secondary | ICD-10-CM

## 2020-10-01 ENCOUNTER — Inpatient Hospital Stay: Payer: PRIVATE HEALTH INSURANCE | Attending: Hematology & Oncology

## 2020-10-01 DIAGNOSIS — D509 Iron deficiency anemia, unspecified: Secondary | ICD-10-CM | POA: Insufficient documentation

## 2020-10-05 ENCOUNTER — Other Ambulatory Visit: Payer: Self-pay

## 2020-10-05 ENCOUNTER — Inpatient Hospital Stay: Payer: PRIVATE HEALTH INSURANCE

## 2020-10-05 VITALS — BP 143/67 | HR 88 | Temp 98.8°F | Resp 18

## 2020-10-05 DIAGNOSIS — D508 Other iron deficiency anemias: Secondary | ICD-10-CM

## 2020-10-05 DIAGNOSIS — D509 Iron deficiency anemia, unspecified: Secondary | ICD-10-CM | POA: Diagnosis not present

## 2020-10-05 MED ORDER — SODIUM CHLORIDE 0.9 % IV SOLN
200.0000 mg | Freq: Once | INTRAVENOUS | Status: AC
  Administered 2020-10-05: 200 mg via INTRAVENOUS
  Filled 2020-10-05: qty 200

## 2020-10-05 MED ORDER — SODIUM CHLORIDE 0.9 % IV SOLN
Freq: Once | INTRAVENOUS | Status: AC
  Filled 2020-10-05: qty 250

## 2020-10-05 NOTE — Patient Instructions (Signed)

## 2020-10-05 NOTE — Progress Notes (Signed)
Pt discharged in no apparent distress. Pt left in electric wheelchair. Pt aware of discharge instructions and verbalized understanding and had no further questions.

## 2020-10-08 ENCOUNTER — Ambulatory Visit: Payer: PRIVATE HEALTH INSURANCE | Admitting: Family Medicine

## 2020-10-08 DIAGNOSIS — Z0289 Encounter for other administrative examinations: Secondary | ICD-10-CM

## 2020-10-11 ENCOUNTER — Other Ambulatory Visit: Payer: Self-pay

## 2020-10-11 ENCOUNTER — Ambulatory Visit (INDEPENDENT_AMBULATORY_CARE_PROVIDER_SITE_OTHER): Payer: PRIVATE HEALTH INSURANCE | Admitting: Family Medicine

## 2020-10-11 ENCOUNTER — Ambulatory Visit (HOSPITAL_BASED_OUTPATIENT_CLINIC_OR_DEPARTMENT_OTHER)
Admission: RE | Admit: 2020-10-11 | Discharge: 2020-10-11 | Disposition: A | Discharge status: Home / Self Care | Payer: PRIVATE HEALTH INSURANCE | Source: Ambulatory Visit | Attending: Family Medicine | Admitting: Family Medicine

## 2020-10-11 ENCOUNTER — Encounter: Payer: Self-pay | Admitting: Family Medicine

## 2020-10-11 ENCOUNTER — Encounter: Admit: 2020-10-11 | Discharge: 2020-10-11 | Payer: MEDICARE

## 2020-10-11 ENCOUNTER — Ambulatory Visit: Admit: 2020-10-11 | Discharge: 2020-10-11 | Payer: MEDICARE

## 2020-10-11 VITALS — BP 108/78 | HR 85 | Temp 98.2°F | Resp 18 | Ht 62.0 in

## 2020-10-11 DIAGNOSIS — B350 Tinea barbae and tinea capitis: Secondary | ICD-10-CM

## 2020-10-11 DIAGNOSIS — B354 Tinea corporis: Secondary | ICD-10-CM | POA: Diagnosis not present

## 2020-10-11 DIAGNOSIS — E785 Hyperlipidemia, unspecified: Secondary | ICD-10-CM

## 2020-10-11 DIAGNOSIS — F419 Anxiety disorder, unspecified: Secondary | ICD-10-CM

## 2020-10-11 DIAGNOSIS — I10 Essential (primary) hypertension: Secondary | ICD-10-CM

## 2020-10-11 DIAGNOSIS — I2510 Atherosclerotic heart disease of native coronary artery without angina pectoris: Secondary | ICD-10-CM | POA: Diagnosis not present

## 2020-10-11 DIAGNOSIS — R0602 Shortness of breath: Secondary | ICD-10-CM | POA: Insufficient documentation

## 2020-10-11 HISTORY — DX: Shortness of breath: R06.02

## 2020-10-11 MED ORDER — NAFTIFINE HCL 1 % EX CREA: TOPICAL_CREAM | Freq: Every day | CUTANEOUS | 0 refills | Status: DC

## 2020-10-11 MED ORDER — CICLOPIROX 1 % EX SHAM
MEDICATED_SHAMPOO | CUTANEOUS | 0 refills | Status: DC
Start: 2020-10-11 — End: 2021-10-17

## 2020-10-11 NOTE — Progress Notes (Signed)
[name], adult    DOB: 1955/11/18  Age: 64 y.o. MRN: [id number]    Subjective:  Subjective  HPI Michaella Nyeema Want presents for rash on her scalp and under her breasts that is itchy --- the powder did not work  She is also c/o doe since she was in the hospital.  No cp, no palpitations    She admits to her anxiety being bad and would like to see the counselor here again  Review of Systems  Constitutional: Negative for appetite change, diaphoresis, fatigue and unexpected weight change.  Eyes: Negative for pain, redness and visual disturbance.  Respiratory: Positive for shortness of breath. Negative for cough, chest tightness and wheezing.   Cardiovascular: Negative for chest pain, palpitations and leg swelling.  Gastrointestinal: Negative.   Endocrine: Negative for cold intolerance, heat intolerance, polydipsia, polyphagia and polyuria.  Genitourinary: Negative for difficulty urinating, dysuria and frequency.  Skin: Positive for rash.  Neurological: Negative for dizziness, light-headedness, numbness and headaches.  Psychiatric/Behavioral: Positive for sleep disturbance. Negative for self-injury and suicidal ideas. The patient is nervous/anxious.     History Past Medical History:  Diagnosis Date  . Acute MI [facility]) 2007   presented to ED & had cardiac cath- but found to have normal coronaries. Since that point in time her PCP cares f or cardiac needs. Dr. [name] - [facility]  . Anemia   . Anginal pain ([place])   . Anxiety   . Asthma   . Bulging lumbar disc   . Cataract   . 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 Cirrhosis of liver without mention of alcohol   . Constipation   . Dehiscence of closure of skin    left partial calcaneal excision  . Depression   . Diabetes mellitus    insulin dependent, adult onset  . Episode of visual loss of left eye   . Exertional shortness of breath   . Fatty liver   .  Fibromyalgia   . GERD (gastroesophageal reflux disease)   . Hepatic steatosis   . High cholesterol   . Hypertension   . MRSA (methicillin resistant Staphylococcus aureus)   . Neuropathy    lower legs  . Osteoarthritis    hands, hips  . Proximal humerus fracture 10/15/12   Left  . PTSD (post-traumatic stress disorder)   . 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); Cardiac catheterization (2007); Skin graft split thickness leg / foot (Right, 03/19/2013); Cholecystectomy (1995); Abdominal hysterectomy (1979); Dilation and curettage of uterus (1977); I & D extremity (Right, 03/19/2013); I & D extremity (Left, 09/08/2016); I & D extremity (Left, 09/29/2016); Incision and drainage (Right, 04/17/2019); IR US Guide Vasc Access Right (04/21/2019); IR Fluoro Guide CV Line Right (04/21/2019); IR Fluoro Guide CV Line Right (08/28/2019); IR US Guide Vasc Access Right (08/28/2019); IR Fluoro Guide CV Line Right (11/04/2019); IR Removal Tun Cv Cath W/O FL (11/18/2019); Esophagogastroduodenoscopy (egd) with propofol (N/A, 02/18/2020); biopsy (02/18/2020); IR US Guide Vasc Access Right (02/25/2020); IR Fluoro Guide CV Line Right (02/25/2020); IR Removal Tun Cv Cath W/O FL (02/26/2020); Anterior lat lumbar fusion (N/A, 01/22/2020); Posterior lumbar fusion 4 level (N/A, 5/62/5638); and Application of robotic assistance for spinal procedure (N/A, 01/22/2020).   Her  family history includes Cancer in her father; Colitis in her father; Crohn's disease in her father; Diabetes in her father and mother; Diabetes Mellitus I in her brother; Diabetes Mellitus II in her brother, brother, brother, and brother; Heart attack in her brother; Heart disease in her brother, brother, and father; Hypertension in her mother;  Irritable bowel syndrome in her daughter; Kidney disease in her brother, brother, brother, brother, and brother; Leukemia in her father; Liver disease in her brother and brother; Mental illness in her mother.She reports that she has never smoked. She has never used smokeless tobacco. She reports that she does not drink alcohol and does not use drugs.  Current Outpatient Medications on File Prior to Visit  Medication Sig Dispense Refill  . acetaminophen (TYLENOL) 325 MG tablet Take 2 tablets (650 mg total) by mouth every 6 (six) hours as needed for fever.    [name] Kitchen atorvastatin (LIPITOR) 10 MG tablet Take 1 tablet by mouth once daily 90 tablet 1  . bethanechol (URECHOLINE) 10 MG tablet Take 1 tablet (10 mg total) by mouth 3 (three) times daily.    . ciprofloxacin (CIPRO) 500 MG tablet Take 500 mg by mouth daily.    . Continuous Blood Gluc Receiver (FREESTYLE LIBRE 14 DAY READER) DEVI 1 Device by Does not apply route daily. 2 each 6  . Continuous Blood Gluc Sensor (FREESTYLE LIBRE 14 DAY SENSOR) MISC 1 Device by Does not apply route daily. 2 each 6  . diazepam (VALIUM) 5 MG tablet Take 1 tablet (5 mg total) by mouth every 12 (twelve) hours as needed for anxiety. 30 tablet 0  . DULoxetine (CYMBALTA) 60 MG capsule Take 1 capsule (60 mg total) by mouth daily. 90 capsule 3  . folic acid (FOLVITE) 1 MG tablet Take 2 tablets (2 mg total) by mouth daily. 60 tablet 6  . HUMALOG KWIKPEN 200 UNIT/ML KwikPen 15 units sub-q twice daily    . HYDROcodone-acetaminophen (NORCO/VICODIN) 5-325 MG tablet Take 1 tablet by mouth every 6 (six) hours as needed for moderate pain. 120 tablet 0  . lidocaine (LIDODERM) 5 % Place 1 patch onto the skin daily. Remove & Discard patch within 12 hours or as directed by MD 30 patch 0  . lisinopril (ZESTRIL) 10 MG tablet Take 10 mg by mouth daily.    . Multiple Vitamin (MULTIVITAMIN WITH MINERALS) TABS tablet Take 1 tablet by mouth daily.    . NYSTATIN powder APPLY  POWDER TOPICALLY THREE  TIMES DAILY 15 g 0  . oxyCODONE (OXY IR/ROXICODONE) 5 MG immediate release tablet Take 1 tablet (5 mg total) by mouth every 3 (three) hours as needed for moderate pain ((score 4 to 6)). 30 tablet 0  . pantoprazole (PROTONIX) 40 MG tablet Take 1 tablet (40 mg total) by mouth daily at 6 (six) AM.    . pregabalin (LYRICA) 75 MG capsule Take 1 capsule (75 mg total) by mouth 3 (three) times daily. 270 capsule 1  . Rivaroxaban (XARELTO) 15 MG TABS tablet Take 1 tablet (15 mg total) by mouth daily with supper. 30 tablet 1  . [DISCONTINUED] metFORMIN (GLUCOPHAGE) 1000 MG tablet Take 1,000 mg by mouth 2 (two) times daily with a meal.      . [DISCONTINUED] omeprazole (PRILOSEC) 20 MG capsule Take 20 mg by mouth daily.       No current facility-administered medications on file prior to visit.     Objective:  Objective  Physical Exam Vitals and nursing note reviewed.  Constitutional:  Appearance: She is well-developed and well-nourished.  HENT:     Head: Normocephalic and atraumatic.  Eyes:     Extraocular Movements: EOM normal.     Conjunctiva/sclera: Conjunctivae normal.  Neck:     Thyroid: No thyromegaly.     Vascular: No carotid bruit or JVD.  Cardiovascular:     Rate and Rhythm: Normal rate and regular rhythm.     Heart sounds: Normal heart sounds. No murmur heard.   Pulmonary:     Effort: Pulmonary effort is normal. No respiratory distress.     Breath sounds: Normal breath sounds. No wheezing or rales.  Chest:     Chest wall: No tenderness.  Musculoskeletal:        General: No edema.     Cervical back: Normal range of motion and neck supple.  Skin:    General: Skin is dry.     Findings: Rash present.     Comments: Scalp scaly ---  Hair thinning  Under breast-- red , c/w tinea   Neurological:     Mental Status: She is alert and oriented to person, place, and time.  Psychiatric:        Mood and Affect: Mood and affect normal.    BP 108/78 (BP Location: Left Arm, Patient  Position: Sitting, Cuff Size: Normal)   Pulse 85   Temp 98.2 F (36.8 C) (Oral)   Resp 18   Ht 5' 2"  (1.575 m)   SpO2 98%   BMI 32.19 kg/m  Wt Readings from Last 3 Encounters:  06/15/20 176 lb (79.8 kg)  02/26/20 187 lb 13.3 oz (85.2 kg)  02/18/20 190 lb 9.6 oz (86.5 kg)     Lab Results  Component Value Date   WBC 5.9 09/14/2020   HGB 10.0 (L) 09/14/2020   HCT 31.4 (L) 09/14/2020   PLT 180 09/14/2020   GLUCOSE 335 (H) 09/14/2020   CHOL 135 06/24/2020   TRIG 151 (H) 06/24/2020   HDL 31 (L) 06/24/2020   LDLDIRECT 115.8 01/23/2012   LDLCALC 79 06/24/2020   ALT 5 09/14/2020   AST 11 (L) 09/14/2020   NA 131 (L) 09/14/2020   K 4.8 09/14/2020   CL 99 09/14/2020   CREATININE 2.11 (H) 09/14/2020   BUN 37 (H) 09/14/2020   CO2 27 09/14/2020   TSH 1.65 01/03/2016   INR 1.2 01/22/2020   HGBA1C 8.1 (H) 06/24/2020   MICROALBUR 26.3 (H) 11/26/2018    US Venous Img Lower Unilateral Left (DVT)  Result Date: 07/15/2020 CLINICAL DATA:  64 year old female with pain and swelling EXAM: LEFT LOWER EXTREMITY VENOUS DOPPLER ULTRASOUND TECHNIQUE: Gray-scale sonography with graded compression, as well as color Doppler and duplex ultrasound were performed to evaluate the lower extremity deep venous systems from the level of the common femoral vein and including the common femoral, femoral, profunda femoral, popliteal and calf veins including the posterior tibial, peroneal and gastrocnemius veins when visible. The superficial great saphenous vein was also interrogated. Spectral Doppler was utilized to evaluate flow at rest and with distal augmentation maneuvers in the common femoral, femoral and popliteal veins. COMPARISON:  None. FINDINGS: Contralateral Common Femoral Vein: Respiratory phasicity is normal and symmetric with the symptomatic side. No evidence of thrombus. Normal compressibility. Common Femoral Vein: No evidence of thrombus. Normal compressibility, respiratory phasicity and response to  augmentation. Saphenofemoral Junction: No evidence of thrombus. Normal compressibility and flow on color Doppler imaging. Profunda Femoral Vein: No evidence of thrombus. Normal compressibility and flow on color Doppler  imaging. Femoral Vein: No evidence of thrombus. Normal compressibility, respiratory phasicity and response to augmentation. Popliteal Vein: No evidence of thrombus. Normal compressibility, respiratory phasicity and response to augmentation. Calf Veins: Calf veins not visualized. Superficial Great Saphenous Vein: No evidence of thrombus. Normal compressibility and flow on color Doppler imaging. Other Findings:  None. IMPRESSION: Sonographic survey of the left lower extremity negative for DVT Electronically Signed   By: Corrie Mckusick D.O.   On: 07/15/2020 14:56     Assessment & Plan:  Plan  I am having Skylin R. Shadduck start on Ciclopirox and naftifine. I am also having her maintain her atorvastatin, lidocaine, acetaminophen, bethanechol, multivitamin with minerals, oxyCODONE, pantoprazole, FreeStyle Libre 14 Day Reader, FreeStyle Libre 14 Day Sensor, Rivaroxaban, DULoxetine, lisinopril, pregabalin, folic acid, diazepam, HumaLOG KwikPen, ciprofloxacin, nystatin, and HYDROcodone-acetaminophen.  Meds ordered this encounter  Medications  . Ciclopirox 1 % shampoo    Sig: Massage in scalp and let sit 3 min and rinse 2 x weekly    Dispense:  120 mL    Refill:  0  . naftifine (NAFTIN) 1 % cream    Sig: Apply topically daily.    Dispense:  30 g    Refill:  0    Problem List Items Addressed This Visit      Unprioritized   CAD (coronary artery disease)    Inc sob----  ekg -- no acute changes Refer to cardiology Check cxr and echo       Relevant Orders   Ambulatory referral to Cardiology   ECHOCARDIOGRAM COMPLETE   Essential hypertension    Well controlled, no changes to meds. Encouraged heart healthy diet such as the DASH diet and exercise as tolerated.       Hyperlipidemia     Encouraged heart healthy diet, increase exercise, avoid trans fats, consider a krill oil cap daily Lab Results  Component Value Date   CHOL 135 06/24/2020   HDL 31 (L) 06/24/2020   LDLCALC 79 06/24/2020   LDLDIRECT 115.8 01/23/2012   TRIG 151 (H) 06/24/2020   CHOLHDL 4.4 06/24/2020         SOB (shortness of breath)    Check cxr and echo Refer cardiology      Relevant Orders   DG Chest 2 View   EKG 12-Lead (Completed)   Ambulatory referral to Cardiology   ECHOCARDIOGRAM COMPLETE   Tinea capitis - Primary    Shampoo per orders 2x a week  If no improvement 2-3 weeks -- refer to derm      Relevant Medications   Ciclopirox 1 % shampoo   naftifine (NAFTIN) 1 % cream   Tinea corporis    naftin qd x 7-10 days  Derm if no better       Relevant Medications   Ciclopirox 1 % shampoo   naftifine (NAFTIN) 1 % cream    Other Visit Diagnoses    Anxiety       Relevant Orders   Ambulatory referral to Psychology      Follow-up: Return in about 3 months (around 01/09/2021).  Ann Held, DO

## 2020-10-11 NOTE — Assessment & Plan Note (Signed)
Well controlled, no changes to meds. Encouraged heart healthy diet such as the DASH diet and exercise as tolerated.  °

## 2020-10-11 NOTE — Assessment & Plan Note (Signed)
Check cxr and echo Refer cardiology

## 2020-10-11 NOTE — Assessment & Plan Note (Signed)
Shampoo per orders 2x a week  If no improvement 2-3 weeks -- refer to derm

## 2020-10-11 NOTE — Assessment & Plan Note (Signed)
Inc sob----  ekg -- no acute changes Refer to cardiology Check cxr and echo

## 2020-10-11 NOTE — Assessment & Plan Note (Signed)
Encouraged heart healthy diet, increase exercise, avoid trans fats, consider a krill oil cap daily Lab Results  Component Value Date   CHOL 135 06/24/2020   HDL 31 (L) 06/24/2020   LDLCALC 79 06/24/2020   LDLDIRECT 115.8 01/23/2012   TRIG 151 (H) 06/24/2020   CHOLHDL 4.4 06/24/2020

## 2020-10-11 NOTE — Progress Notes
An initial comprehensive medication management visit was completed today via telephone.    Referral reason: Diabetes Management and Hypertension Management  Referring provider: Dr. Johny Shears    Assessment & Plan     Diabetes  Patient's diabetes is controlled as reflected by home blood sugars and A1C. Their most recent A1C was 6.9% on 08/24/2020 with a goal of < 7%    Date FBG PM  7-Dec 154 133   155 112  9-Dec 151 142  10-Dec 151 105  11-Dec 174 X  12-Dec 151 121  13-Dec 140 X  Average 154 123    - PM sugars taken after she comes home from gym   - 1 incidence of hypogylcemia symptoms   - Current regimen: glimiperide 1mg  qAM before breakfast, metformin 1000mg  BID       Plan  - Last A1c controlled, but increased from 6.1% --> 6.9%. Has been making very positive lifestyle changes, going to gym 3-4x/week. Encouraged her to continue these. In addition, meeting with dietician for 1st appointment on 12/15  - PM sugars well controlled, but fastings running a little high. However, overall BG average still within goal range. Do not feel need for additional medication at this time. With continued exercise and diet tweaks, she may be able to stay at goal.   - Will check-in ~4 weeks to assess BG trend  - Continue glimeperide 1mg  daily + metformin 1000mg  BID     Hypertension  Patient's blood pressure is controlled per. Their goal blood pressure is < 130/80 mmHg    - Current regimen: clonidine 1mg  TID, amlodipine 10mg  daily   - reports less sedation with 2nd clonidine dose (was increased at last PCP visit)   - overall, BP is trending downward closer to goal (see below)   - has noticed big change in BP since starting to go to gym     Plan  - Do not see need for medication changes given home readings, still slightly above goal, but trending in the right directions, will continue to monitor  - Encouraged and congratulated on sticking with exercise routine  - Continue clonidine 1mg  TID, amlodipine 10mg  qday      Date Systolic  Diastolic Notes   5-Dec 132 79    6-Dec 146 83     103 80 gym   7-Dec 138 80     102 75 gym,   8-Dec 154 81     140 88 no gym   9-Dec 151 89     102 71 gym   10-Dec 136 74     125 72    11-Dec 158 90     151 82 no gym   12-Dec 153 86     119 78 no gym   13-Dec 159 86          Follow-up  The patient will continue to follow up with the pharmacist. Return to pharmacist in 5 weeks The return visit was scheduled during today's visit.    Subjective & Objective   Primary Care - Subjective/Objective    Laboratory Values  N/A    Primary Care - Medication History  Home Medications    Medication Sig   ACCU-CHEK AVIVA PLUS TEST STRP test strip TEST EVERY DAY BEFORE BREAKFAST AS DIRECTED   albuterol sulfate (PROAIR HFA) 90 mcg/actuation HFA aerosol inhaler Inhale two puffs by mouth into the lungs every 6 hours as needed.   alcohol swabs (BD SINGLE USE SWABS REGULAR) USE ONE  A DAY TO CLEAN SKIN BEFORE FINGERSTICK   amLODIPine (NORVASC) 10 mg tablet Take one tablet by mouth daily.   betamethasone valerate (VALISONE) 0.1 % topical cream APPLY  TOPICALLY TO AFFECTED AREA DAILY.   betamethasone valerate (VALISONE) 0.1 % topical ointment APPLY  TOPICALLY TO AFFECTED AREA DAILY.   Blood Glucose Control, Normal soln Use in glucose meter, Accu-check Aviva   Blood-Glucose Meter kit Use as directed to check blood sugars daily before breakfast DX E11.29   cetirizine (ZYRTEC) 10 mg tablet Take 10 mg by mouth daily.     cholecalciferol(+) (VITAMIN D-3) 2,000 unit tablet Take 1 Tab by mouth daily.   cloNIDine HCL (CATAPRES) 0.2 mg tablet Take one tablet by mouth three times daily.   cyanocobalamin (VITAMIN B-12) 500 mcg tablet Take 500 mcg by mouth daily.   cyclobenzaprine (FLEXERIL) 5 mg tablet TAKE 1 TABLET AT BEDTIME  Patient taking differently: Sometimes takes twice daily   famotidine (PEPCID) 20 mg tablet TAKE 1 TABLET TWICE DAILY  Patient taking differently: Take 20 mg by mouth daily.   ferrous sulfate (FEOSOL) 325 mg (65 mg iron) tablet Take 325 mg by mouth every 48 hours.   fluconazole (DIFLUCAN) 150 mg tablet Take one tablet by mouth daily.   fluticasone propionate (FLONASE) 50 mcg/actuation nasal spray, suspension Shake bottle gently before using.  Indications: Use 2 SPRAYS IN EACH NOSTRIL AS DIRECTED DAILY   glimepiride (AMARYL) 2 mg tablet TAKE 1/2 TABLET EVERY DAY WITH BREAKFAST  Patient taking differently: Take 2 mg by mouth daily.   lancets MISC Use one each as directed daily before breakfast. Diag: E11.29   metFORMIN (GLUCOPHAGE) 1,000 mg tablet TAKE 1 TABLET TWICE DAILY WITH MEALS   pantoprazole DR (PROTONIX) 40 mg tablet TAKE 1 TABLET EVERY DAY      Education  Education provided: yes    Diabetes: goals of therapy, management of hypoglycemia and lifestyle management    Hypertension: BP goal and disease risk factors, encouraged patient to monitor BP at home and lifestyle modifications    Time spent with patient: 15 minutes    Noelle Penner, PharmD, BCPS   Clinical Pharmacist - [facility]   [facility]  [phone]

## 2020-10-11 NOTE — Patient Instructions (Signed)
Shortness of Breath, Adult Shortness of breath is when a person has trouble breathing enough air or when a person feels like she or he is having trouble breathing in enough air. Shortness of breath could be a sign of a medical problem. Follow these instructions at home:   Pay attention to any changes in your symptoms.  Do not use any products that contain nicotine or tobacco, such as cigarettes, e-cigarettes, and chewing tobacco.  Do not smoke. Smoking is a common cause of shortness of breath. If you need help quitting, ask your health care provider.  Avoid things that can irritate your airways, such as: ? Mold. ? Dust. ? Air pollution. ? Chemical fumes. ? Things that can cause allergy symptoms (allergens), if you have allergies.  Keep your living space clean and free of mold and dust.  Rest as needed. Slowly return to your usual activities.  Take over-the-counter and prescription medicines only as told by your health care provider. This includes oxygen therapy and inhaled medicines.  Keep all follow-up visits as told by your health care provider. This is important. Contact a health care provider if:  Your condition does not improve as soon as expected.  You have a hard time doing your normal activities, even after you rest.  You have new symptoms. Get help right away if:  Your shortness of breath gets worse.  You have shortness of breath when you are resting.  You feel light-headed or you faint.  You have a cough that is not controlled with medicines.  You cough up blood.  You have pain with breathing.  You have pain in your chest, arms, shoulders, or abdomen.  You have a fever.  You cannot walk up stairs or exercise the way that you normally do. These symptoms may represent a serious problem that is an emergency. Do not wait to see if the symptoms will go away. Get medical help right away. Call your local emergency services (911 in the [place] Do not drive yourself  to the hospital. Summary  Shortness of breath is when a person has trouble breathing enough air. It can be a sign of a medical problem.  Avoid things that irritate your lungs, such as smoking, pollution, mold, and dust.  Pay attention to changes in your symptoms and contact your health care provider if you have a hard time completing daily activities because of shortness of breath. This information is not intended to replace advice given to you by your health care provider. Make sure you discuss any questions you have with your health care provider. Document Revised: 03/18/2018 Document Reviewed: 03/18/2018 Elsevier Patient Education  2020 Elsevier Inc.  

## 2020-10-11 NOTE — Assessment & Plan Note (Signed)
naftin qd x 7-10 days  Derm if no better

## 2020-10-12 ENCOUNTER — Other Ambulatory Visit: Payer: Self-pay | Admitting: Family Medicine

## 2020-10-12 ENCOUNTER — Inpatient Hospital Stay: Payer: PRIVATE HEALTH INSURANCE

## 2020-10-12 DIAGNOSIS — I509 Heart failure, unspecified: Secondary | ICD-10-CM

## 2020-10-13 ENCOUNTER — Ambulatory Visit: Admit: 2020-10-13 | Discharge: 2020-10-14 | Payer: MEDICARE

## 2020-10-14 ENCOUNTER — Telehealth: Payer: Self-pay

## 2020-10-14 ENCOUNTER — Other Ambulatory Visit: Payer: Self-pay | Admitting: Family Medicine

## 2020-10-14 DIAGNOSIS — I209 Angina pectoris, unspecified: Secondary | ICD-10-CM | POA: Insufficient documentation

## 2020-10-14 DIAGNOSIS — F32A Depression, unspecified: Secondary | ICD-10-CM | POA: Insufficient documentation

## 2020-10-14 DIAGNOSIS — H53122 Transient visual loss, left eye: Secondary | ICD-10-CM | POA: Insufficient documentation

## 2020-10-14 DIAGNOSIS — M797 Fibromyalgia: Secondary | ICD-10-CM | POA: Insufficient documentation

## 2020-10-14 DIAGNOSIS — K219 Gastro-esophageal reflux disease without esophagitis: Secondary | ICD-10-CM | POA: Insufficient documentation

## 2020-10-14 DIAGNOSIS — M5136 Other intervertebral disc degeneration, lumbar region: Secondary | ICD-10-CM | POA: Insufficient documentation

## 2020-10-14 DIAGNOSIS — M1990 Unspecified osteoarthritis, unspecified site: Secondary | ICD-10-CM | POA: Insufficient documentation

## 2020-10-14 DIAGNOSIS — H269 Unspecified cataract: Secondary | ICD-10-CM | POA: Insufficient documentation

## 2020-10-14 DIAGNOSIS — M8610 Other acute osteomyelitis, unspecified site: Secondary | ICD-10-CM

## 2020-10-14 DIAGNOSIS — I10 Essential (primary) hypertension: Secondary | ICD-10-CM | POA: Insufficient documentation

## 2020-10-14 DIAGNOSIS — A4902 Methicillin resistant Staphylococcus aureus infection, unspecified site: Secondary | ICD-10-CM | POA: Insufficient documentation

## 2020-10-14 DIAGNOSIS — Z981 Arthrodesis status: Secondary | ICD-10-CM

## 2020-10-14 DIAGNOSIS — K760 Fatty (change of) liver, not elsewhere classified: Secondary | ICD-10-CM | POA: Insufficient documentation

## 2020-10-14 DIAGNOSIS — N189 Chronic kidney disease, unspecified: Secondary | ICD-10-CM | POA: Insufficient documentation

## 2020-10-14 DIAGNOSIS — F4310 Post-traumatic stress disorder, unspecified: Secondary | ICD-10-CM | POA: Insufficient documentation

## 2020-10-14 DIAGNOSIS — D649 Anemia, unspecified: Secondary | ICD-10-CM | POA: Insufficient documentation

## 2020-10-14 DIAGNOSIS — J45909 Unspecified asthma, uncomplicated: Secondary | ICD-10-CM | POA: Insufficient documentation

## 2020-10-14 DIAGNOSIS — T8131XA Disruption of external operation (surgical) wound, not elsewhere classified, initial encounter: Secondary | ICD-10-CM | POA: Insufficient documentation

## 2020-10-14 DIAGNOSIS — R0602 Shortness of breath: Secondary | ICD-10-CM | POA: Insufficient documentation

## 2020-10-14 DIAGNOSIS — G894 Chronic pain syndrome: Secondary | ICD-10-CM

## 2020-10-14 DIAGNOSIS — K5900 Constipation, unspecified: Secondary | ICD-10-CM | POA: Insufficient documentation

## 2020-10-14 DIAGNOSIS — F419 Anxiety disorder, unspecified: Secondary | ICD-10-CM | POA: Insufficient documentation

## 2020-10-14 DIAGNOSIS — I509 Heart failure, unspecified: Secondary | ICD-10-CM

## 2020-10-14 DIAGNOSIS — E7800 Pure hypercholesterolemia, unspecified: Secondary | ICD-10-CM | POA: Insufficient documentation

## 2020-10-14 MED ORDER — HYDROCODONE-ACETAMINOPHEN 5-325 MG PO TABS: 1.0000 | ORAL_TABLET | Freq: Four times a day (QID) | ORAL | 0 refills | Status: DC | PRN

## 2020-10-14 MED ORDER — FUROSEMIDE 20 MG PO TABS: 20.0000 mg | ORAL_TABLET | Freq: Every day | ORAL | 2 refills | Status: DC

## 2020-10-14 NOTE — Telephone Encounter (Signed)
Lab results viewed , medication sent.

## 2020-10-14 NOTE — Telephone Encounter (Signed)
Medication: HYDROcodone-acetaminophen (NORCO/VICODIN) 5-325 MG tablet [[id number]]    Has the patient contacted their pharmacy? No. (If no, request that the patient contact the pharmacy for the refill.) (If yes, when and what did the pharmacy advise?)  Preferred Pharmacy (with phone number or street name):  [facility] [address] [place], [place] - [address]  [address] [place] [address]  Phone:  [phone] Fax:  [phone]  DEA #:  --  DAW Reason: --     Agent: Please be advised that RX refills may take up to 3 business days. We ask that you follow-up with your pharmacy.
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
## 2020-10-14 NOTE — Telephone Encounter (Signed)
Requesting: NORCO Contract:  UDS: 05/06/2018 Last OV: 10/11/20 Next OV: N/A Last Refill: 09/13/20, #120--0 RF Database:   Please advise

## 2020-10-15 ENCOUNTER — Encounter: Payer: Self-pay | Admitting: Cardiology

## 2020-10-15 ENCOUNTER — Ambulatory Visit: Payer: PRIVATE HEALTH INSURANCE | Admitting: Cardiology

## 2020-10-15 ENCOUNTER — Other Ambulatory Visit: Payer: Self-pay

## 2020-10-15 VITALS — BP 162/84 | HR 86 | Ht 61.0 in | Wt 184.0 lb

## 2020-10-15 DIAGNOSIS — I119 Hypertensive heart disease without heart failure: Secondary | ICD-10-CM | POA: Diagnosis not present

## 2020-10-15 DIAGNOSIS — I252 Old myocardial infarction: Secondary | ICD-10-CM

## 2020-10-15 DIAGNOSIS — R0602 Shortness of breath: Secondary | ICD-10-CM | POA: Diagnosis not present

## 2020-10-15 DIAGNOSIS — E7800 Pure hypercholesterolemia, unspecified: Secondary | ICD-10-CM

## 2020-10-15 DIAGNOSIS — N184 Chronic kidney disease, stage 4 (severe): Secondary | ICD-10-CM

## 2020-10-15 MED ORDER — FUROSEMIDE 40 MG PO TABS: 40.0000 mg | ORAL_TABLET | Freq: Every day | ORAL | 3 refills | Status: DC

## 2020-10-15 NOTE — Patient Instructions (Addendum)
Medication Instructions:  Your physician has recommended you make the following change in your medication:  START: Furosemide 40 mg take one tablet by mouth daily. Take an extra tablet of furosemide this afternoon 10/15/20 *If you need a refill on your cardiac medications before your next appointment, please call your pharmacy*   Lab Work: None If you have labs (blood work) drawn today and your tests are completely normal, you will receive your results only by: [name] Kitchen MyChart Message (if you have MyChart) OR . A paper copy in the mail If you have any lab test that is abnormal or we need to change your treatment, we will call you to review the results.   Testing/Procedures: Your physician has requested that you have an echocardiogram. Echocardiography is a painless test that uses sound waves to create images of your heart. It provides your doctor with information about the size and shape of your heart and how well your heart's chambers and valves are working. This procedure takes approximately one hour. There are no restrictions for this procedure.     Follow-Up: At [facility], you and your health needs are our priority.  As part of our continuing mission to provide you with exceptional heart care, we have created designated Provider Care Teams.  These Care Teams include your primary Cardiologist (physician) and Advanced Practice Providers (APPs -  Physician Assistants and Nurse Practitioners) who all work together to provide you with the care you need, when you need it.  We recommend signing up for the patient portal called "MyChart".  Sign up information is provided on this After Visit Summary.  MyChart is used to connect with patients for Virtual Visits (Telemedicine).  Patients are able to view lab/test results, encounter notes, upcoming appointments, etc.  Non-urgent messages can be sent to your provider as well.   To learn more about what you can do with MyChart, go to  [email or web address].    Your next appointment:   4 week(s)  The format for your next appointment:   In Person  Provider:   Shirlee More, MD   Other Instructions

## 2020-10-20 ENCOUNTER — Other Ambulatory Visit: Payer: Self-pay

## 2020-10-20 ENCOUNTER — Other Ambulatory Visit (INDEPENDENT_AMBULATORY_CARE_PROVIDER_SITE_OTHER): Payer: PRIVATE HEALTH INSURANCE

## 2020-10-20 ENCOUNTER — Encounter: Admit: 2020-10-20 | Discharge: 2020-10-20 | Payer: MEDICARE

## 2020-10-20 DIAGNOSIS — I509 Heart failure, unspecified: Secondary | ICD-10-CM

## 2020-10-20 MED ORDER — GLIMEPIRIDE 2 MG PO TAB
ORAL_TABLET | Freq: Every day | 1 refills
Start: 2020-10-20 — End: ?

## 2020-10-21 LAB — BASIC METABOLIC PANEL
BUN: 37 mg/dL — ABNORMAL HIGH (ref 6–23)
CO2: 26 mEq/L (ref 19–32)
Calcium: 8.7 mg/dL (ref 8.4–10.5)
Chloride: 104 mEq/L (ref 96–112)
Creatinine, Ser: 2.46 mg/dL — ABNORMAL HIGH (ref 0.40–1.20)
GFR: 20.18 mL/min — ABNORMAL LOW (ref >60.00)
Glucose, Bld: 166 mg/dL — ABNORMAL HIGH (ref 70–99)
Potassium: 4 mEq/L (ref 3.5–5.1)
Sodium: 138 mEq/L (ref 135–145)

## 2020-10-27 ENCOUNTER — Inpatient Hospital Stay: Payer: PRIVATE HEALTH INSURANCE

## 2020-10-27 ENCOUNTER — Encounter: Admit: 2020-10-27 | Discharge: 2020-10-27 | Payer: MEDICARE

## 2020-10-27 MED ORDER — METFORMIN 1,000 MG PO TAB
ORAL_TABLET | Freq: Two times a day (BID) | 1 refills
Start: 2020-10-27 — End: ?

## 2020-11-11 ENCOUNTER — Other Ambulatory Visit: Payer: Self-pay | Admitting: Family Medicine

## 2020-11-11 DIAGNOSIS — G894 Chronic pain syndrome: Secondary | ICD-10-CM

## 2020-11-11 DIAGNOSIS — Z981 Arthrodesis status: Secondary | ICD-10-CM

## 2020-11-11 DIAGNOSIS — M8610 Other acute osteomyelitis, unspecified site: Secondary | ICD-10-CM

## 2020-11-11 MED ORDER — HYDROCODONE-ACETAMINOPHEN 5-325 MG PO TABS: 1.0000 | ORAL_TABLET | Freq: Four times a day (QID) | ORAL | 0 refills | Status: DC | PRN

## 2020-11-11 NOTE — Telephone Encounter (Signed)
Medication:   HYDROcodone-acetaminophen (NORCO/VICODIN) 5-325 MG tablet [[id number]]    Has the patient contacted their pharmacy?  (If no, request that the patient contact the pharmacy for the refill.) (If yes, when and what did the pharmacy advise?)     Preferred Pharmacy (with phone number or street name):    [facility] [address] [place], [place] - [address]  [address] [place] [address]  Phone:  [phone] Fax:  [phone]  Agent: Please be advised that RX refills may take up to 3 business days. We ask that you follow-up with your pharmacy.

## 2020-11-11 NOTE — Telephone Encounter (Signed)
Requesting: hydrocodone 5/330m Contract: 06/24/2020 UDS: 06/24/2020 Last Visit: 10/11/2020 Next Visit: None Last Refill: 10/14/2020 #120 and 0RF Pt sig: 1 tab q6h prn  Please Advise

## 2020-11-15 ENCOUNTER — Inpatient Hospital Stay: Payer: PRIVATE HEALTH INSURANCE | Attending: Hematology & Oncology

## 2020-11-15 ENCOUNTER — Inpatient Hospital Stay: Payer: PRIVATE HEALTH INSURANCE | Admitting: Family

## 2020-11-15 ENCOUNTER — Encounter: Admit: 2020-11-15 | Discharge: 2020-11-15 | Payer: MEDICARE

## 2020-11-15 DIAGNOSIS — D509 Iron deficiency anemia, unspecified: Secondary | ICD-10-CM | POA: Insufficient documentation

## 2020-11-15 DIAGNOSIS — Z79899 Other long term (current) drug therapy: Secondary | ICD-10-CM | POA: Insufficient documentation

## 2020-11-15 DIAGNOSIS — Z86718 Personal history of other venous thrombosis and embolism: Secondary | ICD-10-CM | POA: Insufficient documentation

## 2020-11-15 DIAGNOSIS — Z7901 Long term (current) use of anticoagulants: Secondary | ICD-10-CM | POA: Insufficient documentation

## 2020-11-16 ENCOUNTER — Telehealth: Payer: Self-pay

## 2020-11-16 NOTE — Telephone Encounter (Signed)
S/w pt and she is aware of her r/s appt to 1-25 from 1/17 no show/weather    aom

## 2020-11-17 ENCOUNTER — Ambulatory Visit (HOSPITAL_BASED_OUTPATIENT_CLINIC_OR_DEPARTMENT_OTHER)
Admission: RE | Admit: 2020-11-17 | Discharge: 2020-11-17 | Disposition: A | Discharge status: Home / Self Care | Payer: PRIVATE HEALTH INSURANCE | Source: Ambulatory Visit | Attending: Cardiology | Admitting: Cardiology

## 2020-11-17 ENCOUNTER — Other Ambulatory Visit: Payer: Self-pay

## 2020-11-17 DIAGNOSIS — I252 Old myocardial infarction: Secondary | ICD-10-CM | POA: Diagnosis not present

## 2020-11-17 DIAGNOSIS — I119 Hypertensive heart disease without heart failure: Secondary | ICD-10-CM

## 2020-11-17 DIAGNOSIS — R0602 Shortness of breath: Secondary | ICD-10-CM

## 2020-11-17 DIAGNOSIS — N184 Chronic kidney disease, stage 4 (severe): Secondary | ICD-10-CM | POA: Diagnosis present

## 2020-11-17 DIAGNOSIS — E7800 Pure hypercholesterolemia, unspecified: Secondary | ICD-10-CM

## 2020-11-17 LAB — ECHOCARDIOGRAM COMPLETE
Area-P 1/2: 3.16 cm2
S' Lateral: 3.36 cm

## 2020-11-22 ENCOUNTER — Ambulatory Visit: Admit: 2020-11-22 | Discharge: 2020-11-22 | Payer: MEDICARE

## 2020-11-22 NOTE — Progress Notes
A follow-up comprehensive medication management visit was completed today via telephone.    Referral reason: Diabetes Management  Referring provider: Dr. Johny Shears    Assessment & Plan     Diabetes. Their goal glucose is pre-prandial 80-130 and post-prandial <180.    - current regimen: glimiperide 2mg  daily, metformin 1g BID   - multiple incidences of hypoglycemia, usually in 70s, happens usually on days she works out, feels shaky, corrects with apple.   - sugars ranging from 70s - 150s, highest recalled was 192.   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 Likely due to combination of glimepride + exercising on empty stomach (ie taking at noon, then not eating again until after evening workout). Recommended eating something prior to workouts to prevent lows afterwards (bananna + spoonful of PB). Hopefully this helps prevent those lows. Have to balance this with higher sugars in the AMs she does not exercise.  - if hypoglycemia persists, may need to consider agent with lower incidence of hypo (SGLT2, DPP4-I) may want updated A1c to help guide this (more A1c lowering with SGLT2 vs DPP4). Another option is to have her halve her glimeperide dose on days she exercises.   - Follow-up in 4 weeks, will go over BG readings in depth and discuss alternative options if hypoglycemia continues      Follow-up  The patient will continue to follow up with the pharmacist. Return to pharmacist in 4 weeks    Subjective & Objective   Primary Care - Subjective/Objective    Laboratory Values  Lab Draw:  Lab Results       Component                Value               Date/Time                  HGBA1C                   6.9 (H)             08/24/2020 09:30 AM        HGBA1C                   6.1 (H)             02/23/2020 09:24 AM        HGBA1C                   6.4 (H)             08/21/2019 11:06 AM   POC:  Rhonda results found for: A1C        Primary Care - Medication History  Home Medications    Medication Sig   ACCU-CHEK AVIVA PLUS TEST STRP test strip TEST EVERY DAY BEFORE BREAKFAST AS DIRECTED   albuterol sulfate (PROAIR HFA) 90 mcg/actuation HFA aerosol inhaler Inhale two puffs by mouth into the lungs every 6 hours as needed.   alcohol swabs (BD SINGLE USE SWABS REGULAR) USE ONE A DAY TO CLEAN SKIN BEFORE FINGERSTICK   amLODIPine (NORVASC) 10 mg tablet Take one tablet by mouth daily.   betamethasone valerate (VALISONE) 0.1 % topical cream APPLY  TOPICALLY TO AFFECTED AREA DAILY.   betamethasone valerate (VALISONE) 0.1 % topical ointment APPLY  TOPICALLY TO AFFECTED AREA DAILY.   Blood Glucose Control, Normal soln Use in glucose meter, Accu-check Aviva   Blood-Glucose Meter kit Use as directed to check blood sugars daily before breakfast DX E11.29   cetirizine (ZYRTEC) 10 mg tablet Take 10 mg by mouth daily.     cholecalciferol(+) (VITAMIN D-3) 2,000 unit tablet Take 1 Tab by mouth daily.   cloNIDine HCL (CATAPRES) 0.2 mg tablet Take one tablet by mouth three times daily.   cyanocobalamin (VITAMIN B-12) 500 mcg tablet Take 500 mcg by mouth daily.   cyclobenzaprine (FLEXERIL) 5 mg tablet TAKE 1 TABLET AT BEDTIME  Patient taking differently: Sometimes takes twice daily   famotidine (PEPCID) 20 mg tablet TAKE 1 TABLET TWICE DAILY  Patient taking differently: Take 20 mg by mouth daily.   ferrous sulfate (FEOSOL) 325 mg (65 mg iron) tablet Take 325 mg by mouth every 48 hours.   fluconazole (DIFLUCAN) 150 mg tablet Take one tablet by mouth daily.   fluticasone propionate (FLONASE) 50 mcg/actuation nasal spray, suspension Shake bottle gently before using.  Indications: Use 2 SPRAYS IN EACH NOSTRIL AS DIRECTED DAILY   glimepiride (AMARYL) 2 mg tablet Take one tablet by mouth daily.   lancets MISC Use one each as directed daily before breakfast. Diag: E11.29   metFORMIN (GLUCOPHAGE) 1,000 mg tablet TAKE 1 TABLET TWICE DAILY WITH MEALS pantoprazole DR (PROTONIX) 40 mg tablet TAKE 1 TABLET EVERY DAY      Education  Education provided: yes    Diabetes: goals of therapy, management of hypoglycemia and lifestyle management    Time spent with patient: 20 minutes    Noelle Penner, [facility]

## 2020-11-23 ENCOUNTER — Ambulatory Visit: Payer: PRIVATE HEALTH INSURANCE | Admitting: Cardiology

## 2020-11-23 ENCOUNTER — Encounter: Payer: Self-pay | Admitting: Family

## 2020-11-23 ENCOUNTER — Encounter: Payer: Self-pay | Admitting: Cardiology

## 2020-11-23 ENCOUNTER — Other Ambulatory Visit: Payer: Self-pay

## 2020-11-23 ENCOUNTER — Inpatient Hospital Stay: Payer: PRIVATE HEALTH INSURANCE

## 2020-11-23 ENCOUNTER — Inpatient Hospital Stay (HOSPITAL_BASED_OUTPATIENT_CLINIC_OR_DEPARTMENT_OTHER): Payer: PRIVATE HEALTH INSURANCE | Admitting: Family

## 2020-11-23 ENCOUNTER — Encounter: Admit: 2020-11-23 | Discharge: 2020-11-23 | Payer: MEDICARE

## 2020-11-23 VITALS — BP 138/62 | HR 78 | Ht 61.0 in | Wt 169.0 lb

## 2020-11-23 VITALS — BP 151/64 | HR 73 | Temp 98.0°F | Resp 19 | Ht 65.0 in | Wt 169.0 lb

## 2020-11-23 DIAGNOSIS — D6859 Other primary thrombophilia: Secondary | ICD-10-CM | POA: Diagnosis not present

## 2020-11-23 DIAGNOSIS — D500 Iron deficiency anemia secondary to blood loss (chronic): Secondary | ICD-10-CM | POA: Diagnosis not present

## 2020-11-23 DIAGNOSIS — N184 Chronic kidney disease, stage 4 (severe): Secondary | ICD-10-CM | POA: Diagnosis not present

## 2020-11-23 DIAGNOSIS — E7211 Homocystinuria: Secondary | ICD-10-CM | POA: Diagnosis not present

## 2020-11-23 DIAGNOSIS — I82503 Chronic embolism and thrombosis of unspecified deep veins of lower extremity, bilateral: Secondary | ICD-10-CM | POA: Diagnosis not present

## 2020-11-23 DIAGNOSIS — I5032 Chronic diastolic (congestive) heart failure: Secondary | ICD-10-CM | POA: Diagnosis not present

## 2020-11-23 DIAGNOSIS — I110 Hypertensive heart disease with heart failure: Secondary | ICD-10-CM

## 2020-11-23 DIAGNOSIS — Z86718 Personal history of other venous thrombosis and embolism: Secondary | ICD-10-CM | POA: Diagnosis not present

## 2020-11-23 DIAGNOSIS — Z79899 Other long term (current) drug therapy: Secondary | ICD-10-CM | POA: Diagnosis not present

## 2020-11-23 DIAGNOSIS — D509 Iron deficiency anemia, unspecified: Secondary | ICD-10-CM | POA: Diagnosis not present

## 2020-11-23 DIAGNOSIS — Z7901 Long term (current) use of anticoagulants: Secondary | ICD-10-CM | POA: Diagnosis not present

## 2020-11-23 LAB — CBC WITH DIFFERENTIAL (CANCER CENTER ONLY)
Abs Immature Granulocytes: 0.03 10*3/uL (ref 0.00–0.07)
Basophils Absolute: 0.1 10*3/uL (ref 0.0–0.1)
Basophils Relative: 1 %
Eosinophils Absolute: 0.3 10*3/uL (ref 0.0–0.5)
Eosinophils Relative: 4 %
HCT: 31 % — ABNORMAL LOW (ref 36.0–46.0)
Hemoglobin: 9.6 g/dL — ABNORMAL LOW (ref 12.0–15.0)
Immature Granulocytes: 1 %
Lymphocytes Relative: 17 %
Lymphs Abs: 1.1 10*3/uL (ref 0.7–4.0)
MCH: 27.3 pg (ref 26.0–34.0)
MCHC: 31 g/dL (ref 30.0–36.0)
MCV: 88.1 fL (ref 80.0–100.0)
Monocytes Absolute: 0.4 10*3/uL (ref 0.1–1.0)
Monocytes Relative: 7 %
Neutro Abs: 4.6 10*3/uL (ref 1.7–7.7)
Neutrophils Relative %: 70 %
Platelet Count: 181 10*3/uL (ref 150–400)
RBC: 3.52 MIL/uL — ABNORMAL LOW (ref 3.87–5.11)
RDW: 15.4 % (ref 11.5–15.5)
WBC Count: 6.5 10*3/uL (ref 4.0–10.5)
nRBC: 0 % (ref 0.0–0.2)

## 2020-11-23 LAB — RETICULOCYTES
Immature Retic Fract: 11.7 % (ref 2.3–15.9)
RBC.: 3.52 MIL/uL — ABNORMAL LOW (ref 3.87–5.11)
Retic Count, Absolute: 73.2 10*3/uL (ref 19.0–186.0)
Retic Ct Pct: 2.1 % (ref 0.4–3.1)

## 2020-11-23 LAB — CMP (CANCER CENTER ONLY)
ALT: 8 U/L (ref 0–44)
AST: 13 U/L — ABNORMAL LOW (ref 15–41)
Albumin: 3.6 g/dL (ref 3.5–5.0)
Alkaline Phosphatase: 91 U/L (ref 38–126)
Anion gap: 7 (ref 5–15)
BUN: 50 mg/dL — ABNORMAL HIGH (ref 8–23)
CO2: 25 mmol/L (ref 22–32)
Calcium: 9.9 mg/dL (ref 8.9–10.3)
Chloride: 106 mmol/L (ref 98–111)
Creatinine: 2.55 mg/dL — ABNORMAL HIGH (ref 0.44–1.00)
GFR, Estimated: 20 mL/min — ABNORMAL LOW (ref >60)
Glucose, Bld: 128 mg/dL — ABNORMAL HIGH (ref 70–99)
Potassium: 5.3 mmol/L — ABNORMAL HIGH (ref 3.5–5.1)
Sodium: 138 mmol/L (ref 135–145)
Total Bilirubin: 0.3 mg/dL (ref 0.3–1.2)
Total Protein: 8.6 g/dL — ABNORMAL HIGH (ref 6.5–8.1)

## 2020-11-23 LAB — D-DIMER, QUANTITATIVE: D-Dimer, Quant: 1.59 ug/mL-FEU — ABNORMAL HIGH (ref 0.00–0.50)

## 2020-11-23 NOTE — Progress Notes (Signed)
Hematology and Oncology Follow Up Visit  [name] [name] [id number] 1956/02/03 65 y.o. 11/23/2020   Principle Diagnosis:  Acute DVT ofboth the right and left peroneal and posterior tibial veins Hyperhomocystinemia  Iron deficiency anemia   Current Therapy: Xarelto 15 mg PO daily Folic acid 2 mg PO daily IV iron as indicated   Interim History:  [name] [name] is here today for follow-up. She is feeling a good but better but still notes fatigue at times. She states that she has been treated for renal insufficiency with lasix over the last month and follows up with her urologist later this week on Friday.  Her weight is down 15 lbs since we saw her last month. She states that most of this was from fluid.  The swelling in her feet and ankles seems a little improved.  She still has wounds on both feet and goes to the wound care clinic to have them cleaned and wrapped. She states that she has an infection in the right heel and is keeping a prescription cream on it. She feels that her surgeon will need to go back in and clean it out at some point.  No falls or syncope. She gets around on a motorized scooter.  She has a good appetite and is staying properly hydrated throughout the day.  No fever, chills, n/v, cough, rash, dizziness, SOB, chest pain, palpitations, abdominal pain or changes in bowel or bladder habits.  She has not noted any frank blood loss. No abnormal bruising, no petechiae.   ECOG Performance Status: 1 - Symptomatic but completely ambulatory  Medications:  Allergies as of 11/23/2020      Reactions   Bee Pollen Anaphylaxis   Fish-derived Products Hives, Shortness Of Breath, Swelling, Rash   Hives get in throat causing trouble breathing   Mushroom Extract Complex Anaphylaxis   Penicillins Anaphylaxis   **Tolerated cefepime March 2021 Did it involve swelling of the face/tongue/throat, SOB, or low BP? Yes Did it involve sudden or severe rash/hives, skin peeling, or any  reaction on the inside of your mouth or nose? No Did you need to seek medical attention at a hospital or doctor's office? Yes When did it last happen?A few months ago If all above answers are "NO", may proceed with cephalosporin use.   Rosemary Oil Anaphylaxis   Shellfish Allergy Hives, Shortness Of Breath, Swelling, Rash   Tomato Hives, Shortness Of Breath   Hives in throat causes her trouble breathing   Acetaminophen Other (See Comments)   GI upset   Aloe Vera Hives   Broccoli [brassica Oleracea] Hives   Acyclovir And Related Other (See Comments)   Unknown reaction   Naproxen Other (See Comments)   Unknown reaction      Medication List       Accurate as of November 23, 2020  2:51 PM. If you have any questions, ask your nurse or doctor.        acetaminophen 325 MG tablet Commonly known as: TYLENOL Take 2 tablets (650 mg total) by mouth every 6 (six) hours as needed for fever.   atorvastatin 10 MG tablet Commonly known as: LIPITOR Take 1 tablet by mouth once daily   Ciclopirox 1 % shampoo Massage in scalp and let sit 3 min and rinse 2 x weekly   diazepam 5 MG tablet Commonly known as: VALIUM Take 1 tablet (5 mg total) by mouth every 12 (twelve) hours as needed for anxiety.   DULoxetine 60 MG capsule Commonly known as:  Cymbalta Take 1 capsule (60 mg total) by mouth daily.   folic acid 1 MG tablet Commonly known as: FOLVITE Take 2 tablets (2 mg total) by mouth daily.   FreeStyle Libre 14 Day Reader Kerrin Mo 1 Device by Does not apply route daily.   FreeStyle Libre 14 Day Sensor Misc 1 Device by Does not apply route daily.   furosemide 20 MG tablet Commonly known as: LASIX Take 20 mg by mouth daily. What changed: Another medication with the same name was removed. Continue taking this medication, and follow the directions you see here. Changed by: Shirlee More, MD   HumaLOG KwikPen 200 UNIT/ML KwikPen Generic drug: insulin lispro 15 units sub-q twice daily    HYDROcodone-acetaminophen 5-325 MG tablet Commonly known as: NORCO/VICODIN Take 1 tablet by mouth every 6 (six) hours as needed for moderate pain.   lidocaine 5 % Commonly known as: Lidoderm Place 1 patch onto the skin daily. Remove & Discard patch within 12 hours or as directed by MD   multivitamin Chew chewable tablet Chew 1 tablet by mouth daily.   multivitamin with minerals Tabs tablet Take 1 tablet by mouth daily.   naftifine 1 % cream Commonly known as: NAFTIN Apply topically daily.   nystatin powder Generic drug: nystatin APPLY  POWDER TOPICALLY THREE TIMES DAILY   pregabalin 75 MG capsule Commonly known as: LYRICA Take 1 capsule (75 mg total) by mouth 3 (three) times daily.       Allergies:  Allergies  Allergen Reactions  . Bee Pollen Anaphylaxis  . Fish-Derived Products Hives, Shortness Of Breath, Swelling and Rash    Hives get in throat causing trouble breathing  . Mushroom Extract Complex Anaphylaxis  . Penicillins Anaphylaxis    **Tolerated cefepime March 2021 Did it involve swelling of the face/tongue/throat, SOB, or low BP? Yes Did it involve sudden or severe rash/hives, skin peeling, or any reaction on the inside of your mouth or nose? No Did you need to seek medical attention at a hospital or doctor's office? Yes When did it last happen?A few months ago If all above answers are "NO", may proceed with cephalosporin use.  [name] Kitchen Rosemary Oil Anaphylaxis  . Shellfish Allergy Hives, Shortness Of Breath, Swelling and Rash  . Tomato Hives and Shortness Of Breath    Hives in throat causes her trouble breathing  . Acetaminophen Other (See Comments)    GI upset  . Aloe Vera Hives  . Broccoli [Brassica Oleracea] Hives  . Acyclovir And Related Other (See Comments)    Unknown reaction  . Naproxen Other (See Comments)    Unknown reaction    Past Medical History, Surgical history, Social history, and Family History were reviewed and updated.  Review of  Systems: All other 10 point review of systems is negative.   Physical Exam:  height is 5' 5"  (1.651 m) and weight is 169 lb (76.7 kg). Her oral temperature is 98 F (36.7 C). Her blood pressure is 151/64 (abnormal) and her pulse is 73. Her respiration is 19 and oxygen saturation is 100%.   Wt Readings from Last 3 Encounters:  11/23/20 169 lb (76.7 kg)  11/23/20 169 lb (76.7 kg)  10/15/20 184 lb (83.5 kg)    Ocular: Sclerae unicteric, pupils equal, round and reactive to light Ear-nose-throat: Oropharynx clear, dentition fair Lymphatic: No cervical or supraclavicular adenopathy Lungs no rales or rhonchi, good excursion bilaterally Heart regular rate and rhythm, no murmur appreciated Abd soft, nontender, positive bowel sounds MSK no focal  spinal tenderness, no joint edema Neuro: non-focal, well-oriented, appropriate affect Breasts: Deferred   Lab Results  Component Value Date   WBC 6.5 11/23/2020   HGB 9.6 (L) 11/23/2020   HCT 31.0 (L) 11/23/2020   MCV 88.1 11/23/2020   PLT 181 11/23/2020   Lab Results  Component Value Date   FERRITIN 467 (H) 09/14/2020   IRON 41 09/14/2020   TIBC 223 (L) 09/14/2020   UIBC 182 09/14/2020   IRONPCTSAT 18 (L) 09/14/2020   Lab Results  Component Value Date   RETICCTPCT 2.1 11/23/2020   RBC 3.52 (L) 11/23/2020   RBC 3.52 (L) 11/23/2020   No results found for: KPAFRELGTCHN, LAMBDASER, KAPLAMBRATIO No results found for: IGGSERUM, IGA, IGMSERUM No results found for: Ronnald Ramp, A1GS, Nelida Meuse, SPEI   Chemistry      Component Value Date/Time   NA 138 11/23/2020 1338   K 5.3 (H) 11/23/2020 1338   CL 106 11/23/2020 1338   CO2 25 11/23/2020 1338   BUN 50 (H) 11/23/2020 1338   CREATININE 2.55 (H) 11/23/2020 1338   CREATININE 1.97 (H) 06/24/2020 1431      Component Value Date/Time   CALCIUM 9.9 11/23/2020 1338   ALKPHOS 91 11/23/2020 1338   AST 13 (L) 11/23/2020 1338   ALT 8 11/23/2020 1338    BILITOT 0.3 11/23/2020 1338       Impression and Plan: Ms. Kolodny is a very pleasant 65 yo caucasian female withlong standing history of anemia as well as recent diagnosis of DVT's in both the right and left peroneal and posterior tibial veins while she was bed bound in the hospital being treated for osteomyelitis. She had an elevated homocystine level, mildly elevatedanticardiolipin antibody IgG leveland elevatedD-dimer at that time.  D-dimer today is 1.59.  Iron studies are pending. We can replace if needed.  She is taking her folic acid daily.  She is tolerating Xarelto nicely and will continue her same regimen.  Follow-up in 2 months.  She was encouraged to contact our office with any questions or concerns. We can certainly see her sooner if needed.   Laverna Peace, NP 1/25/20222:51 PM

## 2020-11-23 NOTE — Patient Instructions (Signed)
Medication Instructions:  Your physician recommends that you continue on your current medications as directed. Please refer to the Current Medication list given to you today.  *If you need a refill on your cardiac medications before your next appointment, please call your pharmacy*   Lab Work: None If you have labs (blood work) drawn today and your tests are completely normal, you will receive your results only by: . MyChart Message (if you have MyChart) OR . A paper copy in the mail If you have any lab test that is abnormal or we need to change your treatment, we will call you to review the results.   Testing/Procedures: None   Follow-Up: At [facility], you and your health needs are our priority.  As part of our continuing mission to provide you with exceptional heart care, we have created designated Provider Care Teams.  These Care Teams include your primary Cardiologist (physician) and Advanced Practice Providers (APPs -  Physician Assistants and Nurse Practitioners) who all work together to provide you with the care you need, when you need it.  We recommend signing up for the patient portal called "MyChart".  Sign up information is provided on this After Visit Summary.  MyChart is used to connect with patients for Virtual Visits (Telemedicine).  Patients are able to view lab/test results, encounter notes, upcoming appointments, etc.  Non-urgent messages can be sent to your provider as well.   To learn more about what you can do with MyChart, go to [email or web address].    Your next appointment:   As needed  The format for your next appointment:   In Person  Provider:   Brian Munley, MD   Other Instructions   
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
## 2020-11-24 ENCOUNTER — Telehealth: Payer: Self-pay

## 2020-11-24 LAB — IRON AND TIBC
Iron: 49 ug/dL (ref 41–142)
Saturation Ratios: 20 % — ABNORMAL LOW (ref 21–57)
TIBC: 238 ug/dL (ref 236–444)
UIBC: 189 ug/dL (ref 120–384)

## 2020-11-24 LAB — FERRITIN: Ferritin: 459 ng/mL — ABNORMAL HIGH (ref 11–307)

## 2020-11-24 LAB — HOMOCYSTEINE: Homocysteine: 20.3 umol/L — ABNORMAL HIGH (ref 0.0–17.2)

## 2020-11-24 NOTE — Telephone Encounter (Signed)
Called pt with appt for 12/2020 appts per 11/23/20 los   [name] [name]

## 2020-11-25 ENCOUNTER — Telehealth: Payer: Self-pay | Admitting: Family

## 2020-11-25 LAB — CARDIOLIPIN ANTIBODIES, IGG, IGM, IGA
Anticardiolipin IgA: <9 APL U/mL (ref 0–11)
Anticardiolipin IgG: 18 GPL U/mL — ABNORMAL HIGH (ref 0–14)
Anticardiolipin IgM: <9 MPL U/mL (ref 0–12)

## 2020-11-25 NOTE — Telephone Encounter (Signed)
Called and LMVM for patient with dates & times for iron infusion appointments that have been added. Per 1/26 sch msg

## 2020-11-30 ENCOUNTER — Inpatient Hospital Stay: Payer: PRIVATE HEALTH INSURANCE | Attending: Hematology & Oncology

## 2020-11-30 ENCOUNTER — Other Ambulatory Visit: Payer: Self-pay

## 2020-11-30 ENCOUNTER — Ambulatory Visit: Payer: PRIVATE HEALTH INSURANCE | Admitting: Internal Medicine

## 2020-11-30 VITALS — BP 168/68 | HR 72 | Temp 98.0°F | Resp 17

## 2020-11-30 DIAGNOSIS — D509 Iron deficiency anemia, unspecified: Secondary | ICD-10-CM | POA: Diagnosis present

## 2020-11-30 DIAGNOSIS — D508 Other iron deficiency anemias: Secondary | ICD-10-CM

## 2020-11-30 MED ORDER — SODIUM CHLORIDE 0.9 % IV SOLN
200.0000 mg | Freq: Once | INTRAVENOUS | Status: AC
  Administered 2020-11-30: 200 mg via INTRAVENOUS
  Filled 2020-11-30: qty 200

## 2020-11-30 MED ORDER — SODIUM CHLORIDE 0.9 % IV SOLN
Freq: Once | INTRAVENOUS | Status: AC
  Filled 2020-11-30: qty 250

## 2020-11-30 NOTE — Patient Instructions (Signed)

## 2020-12-02 ENCOUNTER — Telehealth: Payer: Self-pay

## 2020-12-02 NOTE — Telephone Encounter (Signed)
Attempted to contact patient to office phone/Mychart visit. Left patient a voicemail to contact [facility] to reschedule.  [name] [name]

## 2020-12-06 ENCOUNTER — Telehealth: Payer: Self-pay | Admitting: Family Medicine

## 2020-12-06 DIAGNOSIS — E11621 Type 2 diabetes mellitus with foot ulcer: Secondary | ICD-10-CM

## 2020-12-06 DIAGNOSIS — IMO0002 Reserved for concepts with insufficient information to code with codable children: Secondary | ICD-10-CM

## 2020-12-06 NOTE — Telephone Encounter (Signed)
Please advise. Med not med list

## 2020-12-06 NOTE — Telephone Encounter (Signed)
She was supposed to be seeing endo  They left her messages to call back and schedule appointment

## 2020-12-06 NOTE — Telephone Encounter (Signed)
Patient states she take 20 unit in the morning And 40 unit at night   Medication: insulin glargine (LANTUS) injection 20 Units  [[id number]      Has the patient contacted their pharmacy? no (If no, request that the patient contact the pharmacy for the refill.) (If yes, when and what did the pharmacy advise?)    Preferred Pharmacy (with phone number or street name):    [facility] - [address] [place], [place] - [address] Phone:  [phone]  Fax:  [phone]         Agent: Please be advised that RX refills may take up to 3 business days. We ask that you follow-up with your pharmacy.

## 2020-12-07 MED ORDER — LANTUS SOLOSTAR 100 UNIT/ML ~~LOC~~ SOPN: PEN_INJECTOR | SUBCUTANEOUS | 1 refills | Status: DC

## 2020-12-07 NOTE — Telephone Encounter (Signed)
Spoke with patient and notified that we will be referring her out and sending in refill.

## 2020-12-07 NOTE — Telephone Encounter (Signed)
Patient stated that she has not seen endo in a while.  She did not like who she had saw [facility] is who she saw last) and think they couldn't tell her anything.  She willing to see another provider (maybe outside of [facility]).  Can we send in refill and get her another referral or do you want to manage here?

## 2020-12-07 NOTE — Telephone Encounter (Signed)
Ok to refill meds  And refer to [facility]

## 2020-12-08 ENCOUNTER — Other Ambulatory Visit: Payer: Self-pay

## 2020-12-08 ENCOUNTER — Inpatient Hospital Stay: Payer: PRIVATE HEALTH INSURANCE

## 2020-12-08 VITALS — BP 145/66 | HR 75 | Temp 98.8°F | Resp 18

## 2020-12-08 DIAGNOSIS — D509 Iron deficiency anemia, unspecified: Secondary | ICD-10-CM | POA: Diagnosis not present

## 2020-12-08 DIAGNOSIS — D508 Other iron deficiency anemias: Secondary | ICD-10-CM

## 2020-12-08 MED ORDER — SODIUM CHLORIDE 0.9 % IV SOLN
200.0000 mg | Freq: Once | INTRAVENOUS | Status: AC
  Administered 2020-12-08: 200 mg via INTRAVENOUS
  Filled 2020-12-08: qty 200

## 2020-12-08 MED ORDER — SODIUM CHLORIDE 0.9 % IV SOLN
Freq: Once | INTRAVENOUS | Status: AC
Start: 2020-12-08 — End: 2020-12-08
  Filled 2020-12-08: qty 250

## 2020-12-08 NOTE — Patient Instructions (Signed)

## 2020-12-10 MED ORDER — BETAMETHASONE VALERATE 0.1 % TP CREA
Freq: Every day | 1 refills
Start: 2020-12-10 — End: ?

## 2020-12-15 MED ORDER — FAMOTIDINE 20 MG PO TAB
ORAL_TABLET | Freq: Two times a day (BID) | 2 refills
Start: 2020-12-15 — End: ?

## 2020-12-16 ENCOUNTER — Other Ambulatory Visit: Payer: Self-pay | Admitting: Family Medicine

## 2020-12-16 DIAGNOSIS — Z981 Arthrodesis status: Secondary | ICD-10-CM

## 2020-12-16 DIAGNOSIS — M8610 Other acute osteomyelitis, unspecified site: Secondary | ICD-10-CM

## 2020-12-16 DIAGNOSIS — G894 Chronic pain syndrome: Secondary | ICD-10-CM

## 2020-12-16 MED ORDER — HYDROCODONE-ACETAMINOPHEN 5-325 MG PO TABS
1.0000 | ORAL_TABLET | Freq: Four times a day (QID) | ORAL | 0 refills | Status: DC | PRN
Start: 2020-12-16 — End: 2021-01-13

## 2020-12-16 NOTE — Telephone Encounter (Signed)
Medication:  HYDROcodone-acetaminophen (NORCO/VICODIN) 5-325 MG tablet   Has the patient contacted their pharmacy? No. (If no, request that the patient contact the pharmacy for the refill.) (If yes, when and what did the pharmacy advise?)  Preferred Pharmacy (with phone number or street name): [facility] [address] [place], [place] - [address]  [address] [place] [address]  Phone:  [phone] Fax:  [phone]   Agent: Please be advised that RX refills may take up to 3 business days. We ask that you follow-up with your pharmacy.

## 2020-12-16 NOTE — Telephone Encounter (Signed)
Requesting: NORCO Contract: 08/16/17 UDS: 05/06/2018 Last OV:  10/11/2020 Next OV: N/A Last Refill: 11/11/2020, #120--0 RF Database:   Please advise

## 2020-12-20 MED ORDER — CLONIDINE HCL 0.2 MG PO TAB
.3 mg | ORAL_TABLET | Freq: Three times a day (TID) | ORAL | 3 refills | Status: AC
Start: 2020-12-20 — End: ?
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
## 2020-12-22 ENCOUNTER — Encounter: Admit: 2020-12-22 | Discharge: 2020-12-22 | Payer: MEDICARE

## 2020-12-22 DIAGNOSIS — I10 Essential (primary) hypertension: Secondary | ICD-10-CM

## 2020-12-24 ENCOUNTER — Encounter: Admit: 2020-12-24 | Discharge: 2020-12-24 | Payer: MEDICARE
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
## 2020-12-27 ENCOUNTER — Encounter: Admit: 2020-12-27 | Discharge: 2020-12-27 | Payer: MEDICARE

## 2020-12-27 MED ORDER — BD ALCOHOL SWABS TP PADM
0 refills
Start: 2020-12-27 — End: ?

## 2020-12-27 MED ORDER — PANTOPRAZOLE 40 MG PO TBEC
ORAL_TABLET | Freq: Every day | 1 refills
Start: 2020-12-27 — End: ?

## 2020-12-27 NOTE — Telephone Encounter
Call from [place] w/ [facility] that pt has ben DC from [facility] and needs to be sure that Dr Paula Libra will follow pt for [facility].   Informed that Dr Paula Libra will follow for [facility] and has upcoming appt.

## 2020-12-27 NOTE — Telephone Encounter
Hospital Discharge Follow Up      Reached Patient:Yes     Admission Information:     Hospital Name: [facility]  Admission Date: 12/22/20  Discharge Date: 12/24/20  Admission Diagnosis: stomach pain, head was pounding, weakness. Feels like I was poisoned  Discharge Diagnosis: Dehydration s/t diarrhea per patient  Has there been a discharge within the last 30 days? No  If yes, reason:   Hospital Services: Unplanned  Today's call is 1(business) days post discharge      Discharge Instruction Review   Did patient receive and understand discharge instructions? Yes    Home Health ordered? No                 Agency name/telephone number:    Has Home Health agency contacted patient? No   Caregiver assistance in the home? Patient is independent w/adl's. Family is nearby   Are there concerns regarding the patient's ADL'S? No  Is patient a fall risk? Yes    Special diet? heart healthy, consistent carb If yes, type:       Medication Reconciliation    Changes to pre-hospital medications? No  Were new prescriptions filled?Yes 'an antibiotic'.     Meds reviewed and reconciled?Patient is using Tylenol PRN  ? ACCU-CHEK AVIVA PLUS TEST STRP test strip TEST EVERY DAY BEFORE BREAKFAST AS DIRECTED   ? albuterol sulfate (PROAIR HFA) 90 mcg/actuation HFA aerosol inhaler Inhale two puffs by mouth into the lungs every 6 hours as needed.   ? alcohol swabs (BD SINGLE USE SWABS REGULAR) USE ONE A DAY TO CLEAN SKIN BEFORE FINGERSTICK   ? amLODIPine (NORVASC) 10 mg tablet Take one tablet by mouth daily.   ? betamethasone valerate (VALISONE) 0.1 % topical cream APPLY  TOPICALLY TO AFFECTED AREA DAILY.   ? betamethasone valerate (VALISONE) 0.1 % topical ointment APPLY  TOPICALLY TO AFFECTED AREA DAILY.   ? Blood Glucose Control, Normal soln Use in glucose meter, Accu-check Aviva   ? 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 cetirizine (ZYRTEC) 10 mg tablet Take 10 mg by mouth daily.     ? cholecalciferol(+) (VITAMIN D-3) 2,000 unit tablet Take 1 Tab by mouth daily.   ? cloNIDine HCL (CATAPRES) 0.2 mg tablet Take 1.5 tablets by mouth three times daily.   ? cyanocobalamin (VITAMIN B-12) 500 mcg tablet Take 500 mcg by mouth daily.   ? cyclobenzaprine (FLEXERIL) 5 mg tablet TAKE 1 TABLET AT BEDTIME (Patient taking differently: Sometimes takes twice daily)   ? famotidine (PEPCID) 20 mg tablet TAKE 1 TABLET TWICE DAILY   ? ferrous sulfate (FEOSOL) 325 mg (65 mg iron) tablet Take 325 mg by mouth every 48 hours.   ? fluconazole (DIFLUCAN) 150 mg tablet Take one tablet by mouth daily.   ? fluticasone propionate (FLONASE) 50 mcg/actuation nasal spray, suspension Shake bottle gently before using.  Indications: Use 2 SPRAYS IN EACH NOSTRIL AS DIRECTED DAILY   ? glimepiride (AMARYL) 2 mg tablet Take one tablet by mouth daily.   ? lancets MISC Use one each as directed daily before breakfast. Diag: E11.29   ? metFORMIN (GLUCOPHAGE) 1,000 mg tablet TAKE 1 TABLET TWICE DAILY WITH MEALS   ? pantoprazole DR (PROTONIX) 40 mg tablet TAKE 1 TABLET EVERY DAY         Understanding Condition   Having any current symptoms? Yes, Patient denies chest, abdominal, head pain, diarrhea. She has none of  the symptoms that took her to the hospital. She fell down steps yesturday and sprained an ankle. She reports a misstep, and denies lightheadedness, presyncope. There is swelling on one side of the ankle. She denies numbness, tingling at that foot. She is elevating it and using a cold pack to manage pain. She no longer is having diarrhea, and she is taking an antibiotic per discharge instruction. She has not measured her blood pressure since returning home, but feels it's ok. She has not measured her blood sugar due to broken glucose monitor. [employer] will be replacing the monitor per patient.   Patient understands when to seek additional medical care? Yes   Other instructions provided : Resources reviewed. Report worsening or new symptoms      Scheduling Follow-up Appointment   Upcoming appointment date and time and with whom scheduled:   Future Appointments   Date Time Provider Department Center   12/31/2020  3:00 PM Shela Leff, MD [facility]   02/25/2021 10:00 AM Shela Leff, MD [facility]     PCP appointment scheduled?Yes, Date: Dr. Johny Shears 12/31/20   PCP primary location: [facility]  Specialist appointment scheduled? No  Both PCP and Specialist appointment scheduled: No  Is assistance with transportation needed?No   MyChart message sent? Active in MyChart. No message sent.     Danae Chen, RN

## 2020-12-30 ENCOUNTER — Encounter: Payer: Self-pay | Admitting: Family Medicine

## 2020-12-30 ENCOUNTER — Telehealth: Payer: Self-pay | Admitting: Family Medicine

## 2020-12-30 ENCOUNTER — Other Ambulatory Visit: Payer: Self-pay

## 2020-12-30 ENCOUNTER — Ambulatory Visit (INDEPENDENT_AMBULATORY_CARE_PROVIDER_SITE_OTHER): Payer: PRIVATE HEALTH INSURANCE | Admitting: Family Medicine

## 2020-12-30 ENCOUNTER — Encounter: Admit: 2020-12-30 | Discharge: 2020-12-30 | Payer: MEDICARE

## 2020-12-30 VITALS — BP 150/84 | HR 79 | Temp 98.9°F | Resp 16 | Ht 65.0 in

## 2020-12-30 DIAGNOSIS — Z23 Encounter for immunization: Secondary | ICD-10-CM

## 2020-12-30 DIAGNOSIS — L97512 Non-pressure chronic ulcer of other part of right foot with fat layer exposed: Secondary | ICD-10-CM | POA: Diagnosis not present

## 2020-12-30 MED ORDER — FLUCONAZOLE 150 MG PO TAB
150 mg | ORAL_TABLET | Freq: Every day | ORAL | 0 refills | 3.00000 days | Status: AC
Start: 2020-12-30 — End: ?

## 2020-12-30 MED ORDER — DOXYCYCLINE HYCLATE 100 MG PO TABS: 100.0000 mg | ORAL_TABLET | Freq: Two times a day (BID) | ORAL | 0 refills | Status: DC

## 2020-12-30 NOTE — Telephone Encounter
Pt notified.

## 2020-12-30 NOTE — Telephone Encounter
I sent a Rx for Diflucan to her [facility] pharmacy.

## 2020-12-30 NOTE — Telephone Encounter
Pt LM that she was given an antibiotic while in the hospital and now has a yeast infection and would like to get something called in for this.  Pt does have F/U appt scheduled for tomorrow also.

## 2020-12-30 NOTE — Telephone Encounter (Signed)
Patient came in for appt and had paperwork for [place] to fill out  Paperwork given to [place] to fill out

## 2020-12-30 NOTE — Telephone Encounter (Signed)
Noted  

## 2020-12-30 NOTE — Progress Notes (Signed)
[name], adult    DOB: 07-Jun-1956  Age: 65 y.o. MRN: [id number]    Subjective:  Subjective  HPI [name] [name] presents for ulceration bottom R foot since December.    Review of Systems  Constitutional: Negative for appetite change, diaphoresis, fatigue and unexpected weight change.  Eyes: Negative for pain, redness and visual disturbance.  Respiratory: Negative for cough, chest tightness, shortness of breath and wheezing.   Cardiovascular: Negative for chest pain, palpitations and leg swelling.  Endocrine: Negative for cold intolerance, heat intolerance, polydipsia, polyphagia and polyuria.  Genitourinary: Negative for difficulty urinating, dysuria and frequency.  Skin: Positive for wound.  Neurological: Negative for dizziness, light-headedness, numbness and headaches.    History Past Medical History:  Diagnosis Date  . Acute MI [facility]) 2007   presented to ED & had cardiac cath- but found to have normal coronaries. Since that point in time her PCP cares f or cardiac needs. Dr. [name] - [facility]  . Anemia   . Anginal pain ([place])   . Anxiety   . Asthma   . Back pain 11/17/2019  . Bulging lumbar disc   . CAD (coronary artery disease) 01/11/2012  . Cataract   . 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 ([place]) 03/16/2020  . 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 Cirrhosis of liver without mention of alcohol   . Constipation   . Dehiscence of closure of skin    left partial calcaneal excision  . Depression   . Diabetes mellitus    insulin dependent, adult onset  . Diarrhea 09/04/2019  . Episode of visual loss of left eye   . Essential hypertension 12/23/2006   Qualifier: Diagnosis of  By: Larose Kells MD, [place] Exertional shortness of breath   . Fatty liver   . Fibromyalgia   . GERD (gastroesophageal reflux disease)   . Hepatic steatosis   . High cholesterol   . History of MI  (myocardial infarction) 10/06/2013  . Hyperlipidemia 06/03/2008   Qualifier: Diagnosis of  By: Larose Kells MD, [place] Hypertension   . MRSA (methicillin resistant Staphylococcus aureus)   . Neuropathy    lower legs  . Osteoarthritis    hands, hips  . Proximal humerus fracture 10/15/12   Left  . PTSD (post-traumatic stress disorder)   . Renal insufficiency 05/05/2015  . 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 Sleep apnea 11/16/2017  . SOB (shortness of breath) 10/11/2020  . 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   Qualifier: Diagnosis of  By: Charlott Holler CMA, [place]    . 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); Cardiac catheterization (2007); Skin graft split thickness leg / foot (Right, 03/19/2013); Cholecystectomy (1995); Abdominal hysterectomy (1979); Dilation and curettage of uterus (1977); I & D extremity (Right, 03/19/2013); I & D extremity (Left, 09/08/2016); I & D extremity (Left, 09/29/2016); Incision and drainage (Right, 04/17/2019); IR US Guide Vasc Access Right (04/21/2019); IR Fluoro Guide CV Line Right (04/21/2019); IR Fluoro Guide CV Line Right (08/28/2019); IR US Guide Vasc Access Right (08/28/2019); IR Fluoro Guide CV Line Right (11/04/2019); IR Removal Tun Cv Cath W/O FL (  11/18/2019); Esophagogastroduodenoscopy (egd) with propofol (N/A, 02/18/2020); biopsy (02/18/2020); IR US Guide Vasc Access Right (02/25/2020); IR Fluoro Guide CV Line Right (02/25/2020); IR Removal Tun Cv Cath W/O FL (02/26/2020); Anterior lat lumbar fusion (N/A, 01/22/2020); Posterior lumbar fusion 4 level (N/A, 2/40/9735); and Application of robotic assistance for spinal procedure (N/A, 01/22/2020).   Her family history  includes Cancer in her father; Colitis in her father; Crohn's disease in her father; Diabetes in her father and mother; Diabetes Mellitus I in her brother; Diabetes Mellitus II in her brother, brother, brother, and brother; Heart attack in her brother; Heart disease in her brother, brother, and father; Hypertension in her mother; Irritable bowel syndrome in her daughter; Kidney disease in her brother, brother, brother, brother, and brother; Leukemia in her father; Liver disease in her brother and brother; Mental illness in her mother.She reports that she has never smoked. She has never used smokeless tobacco. She reports that she does not drink alcohol and does not use drugs.  Current Outpatient Medications on File Prior to Visit  Medication Sig Dispense Refill  . acetaminophen (TYLENOL) 325 MG tablet Take 2 tablets (650 mg total) by mouth every 6 (six) hours as needed for fever.    [name] Kitchen atorvastatin (LIPITOR) 10 MG tablet Take 1 tablet by mouth once daily 90 tablet 1  . Ciclopirox 1 % shampoo Massage in scalp and let sit 3 min and rinse 2 x weekly 120 mL 0  . Continuous Blood Gluc Receiver (FREESTYLE LIBRE 14 DAY READER) DEVI 1 Device by Does not apply route daily. 2 each 6  . Continuous Blood Gluc Sensor (FREESTYLE LIBRE 14 DAY SENSOR) MISC 1 Device by Does not apply route daily. 2 each 6  . diazepam (VALIUM) 5 MG tablet Take 1 tablet (5 mg total) by mouth every 12 (twelve) hours as needed for anxiety. 30 tablet 0  . DULoxetine (CYMBALTA) 60 MG capsule Take 1 capsule (60 mg total) by mouth daily. 90 capsule 3  . folic acid (FOLVITE) 1 MG tablet Take 2 tablets (2 mg total) by mouth daily. 60 tablet 6  . furosemide (LASIX) 20 MG tablet Take 20 mg by mouth daily.    [name] Kitchen HUMALOG KWIKPEN 200 UNIT/ML KwikPen 15 units sub-q twice daily    . HYDROcodone-acetaminophen (NORCO/VICODIN) 5-325 MG tablet Take 1 tablet by mouth every 6 (six) hours as needed for moderate pain. 120 tablet 0  . insulin glargine (LANTUS  SOLOSTAR) 100 UNIT/ML Solostar Pen Injection 20 units in the morning and 40 units at night 15 mL 1  . lidocaine (LIDODERM) 5 % Place 1 patch onto the skin daily. Remove & Discard patch within 12 hours or as directed by MD 30 patch 0  . Multiple Vitamin (MULTIVITAMIN WITH MINERALS) TABS tablet Take 1 tablet by mouth daily.    . multivitamin (VIT W/EXTRA C) CHEW chewable tablet Chew 1 tablet by mouth daily.    . naftifine (NAFTIN) 1 % cream Apply topically daily. 30 g 0  . NYSTATIN powder APPLY  POWDER TOPICALLY THREE TIMES DAILY 15 g 0  . pregabalin (LYRICA) 75 MG capsule Take 1 capsule (75 mg total) by mouth 3 (three) times daily. 270 capsule 1  . [DISCONTINUED] metFORMIN (GLUCOPHAGE) 1000 MG tablet Take 1,000 mg by mouth 2 (two) times daily with a meal.      . [DISCONTINUED] omeprazole (PRILOSEC) 20 MG capsule Take 20 mg by mouth daily.       No current facility-administered medications on file prior to  visit.     Objective:  Objective  Physical Exam Vitals and nursing note reviewed.  Constitutional:      Appearance: She is well-developed and well-nourished.  HENT:     Head: Normocephalic and atraumatic.  Eyes:     Extraocular Movements: EOM normal.     Conjunctiva/sclera: Conjunctivae normal.  Neck:     Thyroid: No thyromegaly.     Vascular: No carotid bruit or JVD.  Cardiovascular:     Rate and Rhythm: Normal rate and regular rhythm.     Heart sounds: Normal heart sounds. No murmur heard.   Pulmonary:     Effort: Pulmonary effort is normal. No respiratory distress.     Breath sounds: Normal breath sounds. No wheezing or rales.  Chest:     Chest wall: No tenderness.  Musculoskeletal:        General: No edema.     Cervical back: Normal range of motion and neck supple.  Skin:    Findings: Lesion present.  Neurological:     Mental Status: She is alert and oriented to person, place, and time.  Psychiatric:        Mood and Affect: Mood and affect normal.    BP (!) 150/84  (BP Location: Right Arm, Patient Position: Sitting, Cuff Size: Normal)   Pulse 79   Temp 98.9 F (37.2 C) (Oral)   Resp 16   Ht 5' 5"  (1.651 m)   SpO2 99%   BMI 28.12 kg/m  Wt Readings from Last 3 Encounters:  11/23/20 169 lb (76.7 kg)  11/23/20 169 lb (76.7 kg)  10/15/20 184 lb (83.5 kg)      -  Lab Results  Component Value Date   WBC 6.5 11/23/2020   HGB 9.6 (L) 11/23/2020   HCT 31.0 (L) 11/23/2020   PLT 181 11/23/2020   GLUCOSE 128 (H) 11/23/2020   CHOL 135 06/24/2020   TRIG 151 (H) 06/24/2020   HDL 31 (L) 06/24/2020   LDLDIRECT 115.8 01/23/2012   LDLCALC 79 06/24/2020   ALT 8 11/23/2020   AST 13 (L) 11/23/2020   NA 138 11/23/2020   K 5.3 (H) 11/23/2020   CL 106 11/23/2020   CREATININE 2.55 (H) 11/23/2020   BUN 50 (H) 11/23/2020   CO2 25 11/23/2020   TSH 1.65 01/03/2016   INR 1.2 01/22/2020   HGBA1C 8.1 (H) 06/24/2020   MICROALBUR 26.3 (H) 11/26/2018    ECHOCARDIOGRAM COMPLETE  Result Date: 11/17/2020    ECHOCARDIOGRAM REPORT   Patient Name:   [name] [name] Date of Exam: 11/17/2020 Medical Rec #:  [id number]         Height:       61.0 in Accession #:    [id number]        Weight:       184.0 lb Date of Birth:  Apr 19, 1956         BSA:          1.823 m Patient Age:    65 years          BP:           162/84 mmHg Patient Gender: F                 HR:           76 bpm. Exam Location:  [place] Procedure: 2D Echo, Cardiac Doppler and Color Doppler Indications:    R06.02 SOB; R07.9* Chest pain, unspecified; I10 Hypertension  History:  Patient has prior history of Echocardiogram examinations, most                 recent 04/06/2016. Previous Myocardial Infarction, CKD,                 Signs/Symptoms:Shortness of Breath; Risk Factors:Hypertension,                 Diabetes and Dyslipidemia.  Sonographer:    Geradine Girt Referring Phys: [phone] [place]  1. Left ventricular ejection fraction, by estimation, is 55 to 60%. The left ventricle has normal  function. The left ventricle has no regional wall motion abnormalities. There is mild concentric left ventricular hypertrophy. Left ventricular diastolic parameters are consistent with Grade I diastolic dysfunction (impaired relaxation).  2. Right ventricular systolic function is normal. The right ventricular size is normal.  3. The mitral valve is normal in structure. Mild mitral valve regurgitation. No evidence of mitral stenosis.  4. The aortic valve is normal in structure. Aortic valve regurgitation is not visualized. No aortic stenosis is present.  5. Unable to determine Pulmonary artery systolic pressure, No TR jet spectral display. FINDINGS  Left Ventricle: Left ventricular ejection fraction, by estimation, is 55 to 60%. The left ventricle has normal function. The left ventricle has no regional wall motion abnormalities. The left ventricular internal cavity size was normal in size. There is  mild concentric left ventricular hypertrophy. Left ventricular diastolic parameters are consistent with Grade I diastolic dysfunction (impaired relaxation). Right Ventricle: The right ventricular size is normal. No increase in right ventricular wall thickness. Right ventricular systolic function is normal. Left Atrium: Left atrial size was normal in size. Right Atrium: Right atrial size was normal in size. Pericardium: There is no evidence of pericardial effusion. Mitral Valve: The mitral valve is normal in structure. Mild mitral valve regurgitation. No evidence of mitral valve stenosis. Tricuspid Valve: The tricuspid valve is normal in structure. Tricuspid valve regurgitation is not demonstrated. No evidence of tricuspid stenosis. Aortic Valve: The aortic valve is normal in structure. Aortic valve regurgitation is not visualized. No aortic stenosis is present. Pulmonic Valve: The pulmonic valve was normal in structure. Pulmonic valve regurgitation is not visualized. No evidence of pulmonic stenosis. Aorta: The aortic  root is normal in size and structure. Venous: The inferior vena cava is normal in size with greater than 50% respiratory variability, suggesting right atrial pressure of 3 mmHg. IAS/Shunts: No atrial level shunt detected by color flow Doppler.  LEFT VENTRICLE PLAX 2D LVIDd:         4.54 cm  Diastology LVIDs:         3.36 cm  LV e' medial:    5.55 cm/s LV PW:         1.19 cm  LV E/e' medial:  15.1 LV IVS:        1.47 cm  LV e' lateral:   9.68 cm/s LVOT diam:     2.00 cm  LV E/e' lateral: 8.6 LV SV:         60 LV SV Index:   33 LVOT Area:     3.14 cm  RIGHT VENTRICLE RV S prime:     12.80 cm/s TAPSE (M-mode): 1.8 cm LEFT ATRIUM             Index       RIGHT ATRIUM          Index LA diam:        4.00 cm  2.19 cm/m  RA Area:     7.75 cm LA Vol (A2C):   43.5 ml 23.86 ml/m RA Volume:   9.43 ml  5.17 ml/m LA Vol (A4C):   41.6 ml 22.82 ml/m LA Biplane Vol: 43.5 ml 23.86 ml/m  AORTIC VALVE LVOT Vmax:   83.60 cm/s LVOT Vmean:  57.100 cm/s LVOT VTI:    0.190 m  AORTA Ao Root diam: 2.70 cm Ao Asc diam:  2.90 cm MITRAL VALVE MV Area (PHT): 3.16 cm     SHUNTS MV Decel Time: 240 msec     Systemic VTI:  0.19 m MV E velocity: 83.60 cm/s   Systemic Diam: 2.00 cm MV A velocity: 121.00 cm/s MV E/A ratio:  0.69 Kardie Tobb DO Electronically signed by Berniece Salines DO Signature Date/Time: 11/17/2020/3:57:11 PM    Final      Assessment & Plan:  Plan  I am having [name] [name] start on doxycycline. I am also having her maintain her atorvastatin, lidocaine, acetaminophen, multivitamin with minerals, FreeStyle Libre 14 Day Reader, FreeStyle Libre 14 Day Sensor, DULoxetine, pregabalin, folic acid, diazepam, HumaLOG KwikPen, nystatin, Ciclopirox, naftifine, multivitamin, furosemide, Lantus SoloStar, and HYDROcodone-acetaminophen.  Meds ordered this encounter  Medications  . doxycycline (VIBRA-TABS) 100 MG tablet    Sig: Take 1 tablet (100 mg total) by mouth 2 (two) times daily.    Dispense:  20 tablet    Refill:  0     Problem List Items Addressed This Visit   None   Visit Diagnoses    Right foot ulcer, with fat layer exposed ([place])    -  Primary   Relevant Medications   doxycycline (VIBRA-TABS) 100 MG tablet   Other Relevant Orders   Ambulatory referral to Orthopedic Surgery   DG Foot Complete Right   Td vaccine greater than or equal to 7yo preservative free IM (Completed)   Ambulatory referral to Wound Clinic      Follow-up: Return in about 1 week (around 01/06/2021), or if symptoms worsen or fail to improve.  Ann Held, DO

## 2020-12-31 ENCOUNTER — Encounter: Admit: 2020-12-31 | Discharge: 2020-12-31 | Payer: MEDICARE

## 2020-12-31 ENCOUNTER — Ambulatory Visit: Admit: 2020-12-31 | Discharge: 2021-01-01 | Payer: MEDICARE

## 2020-12-31 DIAGNOSIS — E1129 Type 2 diabetes mellitus with other diabetic kidney complication: Secondary | ICD-10-CM

## 2020-12-31 DIAGNOSIS — Z09 Encounter for follow-up examination after completed treatment for conditions other than malignant neoplasm: Secondary | ICD-10-CM

## 2020-12-31 DIAGNOSIS — F22 Delusional disorders: Secondary | ICD-10-CM

## 2020-12-31 LAB — URINALYSIS DIPSTICK
Lab: 1 (ref 1.005–1.030)
Lab: 6 (ref 5.0–8.0)
Lab: NEGATIVE
Lab: NEGATIVE
Lab: NEGATIVE
Lab: NEGATIVE
Lab: NEGATIVE
Lab: NEGATIVE
Lab: NEGATIVE

## 2020-12-31 LAB — CBC AND DIFF
Lab: 0 K/UL (ref 0–0.20)
Lab: 0.2 K/UL (ref 0–0.45)
Lab: 0.7 K/UL (ref 0–0.80)
Lab: 1 % (ref 0–2)
Lab: 11 g/dL — ABNORMAL LOW (ref 12.0–15.0)
Lab: 13 % (ref 11–15)
Lab: 2.3 K/UL (ref 1.0–4.8)
Lab: 26 % (ref 24–44)
Lab: 3 % (ref 0–5)
Lab: 3.9 M/UL — ABNORMAL LOW (ref 4.0–5.0)
Lab: 32 % — ABNORMAL LOW (ref 36–45)
Lab: 327 K/UL — ABNORMAL LOW (ref >60)
Lab: 33 g/dL — ABNORMAL HIGH (ref 32.0–36.0)
Lab: 5.7 K/UL (ref 1.8–7.0)
Lab: 62 % (ref 41–77)
Lab: 8 % (ref 4–12)
Lab: 8.3 FL (ref 7–11)
Lab: 82 FL — ABNORMAL HIGH (ref 80–100)
Lab: 9.1 K/UL (ref 4.5–11.0)

## 2020-12-31 LAB — BASIC METABOLIC PANEL: Lab: 139 MMOL/L (ref 137–147)

## 2020-12-31 MED ORDER — BLOOD-GLUCOSE METER MISC KIT
PACK | Freq: Every morning | 0 refills | 50.00000 days | Status: AC
Start: 2020-12-31 — End: ?

## 2020-12-31 MED ORDER — ACCU-CHEK AVIVA PLUS TEST STRP MISC STRP
1 | ORAL_STRIP | Freq: Two times a day (BID) | 3 refills | 90.00000 days | Status: AC
Start: 2020-12-31 — End: ?

## 2021-01-01 ENCOUNTER — Encounter: Admit: 2021-01-01 | Discharge: 2021-01-01 | Payer: MEDICARE

## 2021-01-01 DIAGNOSIS — R809 Proteinuria, unspecified: Secondary | ICD-10-CM

## 2021-01-01 DIAGNOSIS — D649 Anemia, unspecified: Secondary | ICD-10-CM

## 2021-01-01 DIAGNOSIS — K2970 Gastritis, unspecified, without bleeding: Secondary | ICD-10-CM

## 2021-01-01 DIAGNOSIS — G5600 Carpal tunnel syndrome, unspecified upper limb: Secondary | ICD-10-CM

## 2021-01-01 DIAGNOSIS — J309 Allergic rhinitis, unspecified: Secondary | ICD-10-CM

## 2021-01-01 DIAGNOSIS — G9050 Complex regional pain syndrome I, unspecified: Secondary | ICD-10-CM

## 2021-01-01 DIAGNOSIS — E559 Vitamin D deficiency, unspecified: Secondary | ICD-10-CM

## 2021-01-01 DIAGNOSIS — K5900 Constipation, unspecified: Secondary | ICD-10-CM

## 2021-01-01 DIAGNOSIS — K589 Irritable bowel syndrome without diarrhea: Secondary | ICD-10-CM

## 2021-01-01 DIAGNOSIS — J45909 Unspecified asthma, uncomplicated: Secondary | ICD-10-CM

## 2021-01-01 DIAGNOSIS — I10 Essential (primary) hypertension: Secondary | ICD-10-CM

## 2021-01-01 DIAGNOSIS — N3000 Acute cystitis without hematuria: Secondary | ICD-10-CM

## 2021-01-01 DIAGNOSIS — M79642 Pain in left hand: Secondary | ICD-10-CM

## 2021-01-01 DIAGNOSIS — E119 Type 2 diabetes mellitus without complications: Secondary | ICD-10-CM

## 2021-01-01 DIAGNOSIS — E785 Hyperlipidemia, unspecified: Secondary | ICD-10-CM

## 2021-01-03 ENCOUNTER — Encounter: Admit: 2021-01-03 | Discharge: 2021-01-03 | Payer: MEDICARE

## 2021-01-03 NOTE — Telephone Encounter
LMTCB

## 2021-01-03 NOTE — Telephone Encounter
Call Rhonda Fletcher about her lab results-    -Her kidney blood test is still abnormal.  I want her to have an ultrasound of her kidneys to make sure they look ok - please send order for a Renal US to the facility of her choice, since she doesn't live near [place].    -The Urine test did not show a bladder infection, but did show yeast.  She has already been treated with Diflucan and this should  Clear it up.

## 2021-01-04 ENCOUNTER — Encounter: Admit: 2021-01-04 | Discharge: 2021-01-04 | Payer: MEDICARE

## 2021-01-04 NOTE — Telephone Encounter
Ok, noted.

## 2021-01-04 NOTE — Telephone Encounter
Call from pts dtr that she had a bad episode at home last Sat and EMS was called and she has been inpt at [facility]. 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 She is going to be transferred to rehab in [facility] for awhile. States pt was admitted involuntarily and that Jodel is very upset with her family at this point.   She does have a F/U here the end of April.

## 2021-01-06 ENCOUNTER — Ambulatory Visit: Payer: PRIVATE HEALTH INSURANCE | Admitting: Family Medicine

## 2021-01-06 ENCOUNTER — Encounter: Admit: 2021-01-06 | Discharge: 2021-01-06 | Payer: MEDICARE

## 2021-01-07 NOTE — Telephone Encounter (Signed)
Patient checking the status of paperwork

## 2021-01-10 ENCOUNTER — Other Ambulatory Visit: Payer: Self-pay

## 2021-01-10 ENCOUNTER — Encounter (HOSPITAL_BASED_OUTPATIENT_CLINIC_OR_DEPARTMENT_OTHER): Payer: PRIVATE HEALTH INSURANCE | Attending: Internal Medicine | Admitting: Internal Medicine

## 2021-01-10 ENCOUNTER — Encounter: Admit: 2021-01-10 | Discharge: 2021-01-10 | Payer: MEDICARE

## 2021-01-10 DIAGNOSIS — L97423 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: Secondary | ICD-10-CM | POA: Insufficient documentation

## 2021-01-10 DIAGNOSIS — Z91030 Bee allergy status: Secondary | ICD-10-CM | POA: Insufficient documentation

## 2021-01-10 DIAGNOSIS — Z880 Allergy status to penicillin: Secondary | ICD-10-CM | POA: Insufficient documentation

## 2021-01-10 DIAGNOSIS — E1122 Type 2 diabetes mellitus with diabetic chronic kidney disease: Secondary | ICD-10-CM | POA: Diagnosis not present

## 2021-01-10 DIAGNOSIS — I509 Heart failure, unspecified: Secondary | ICD-10-CM | POA: Insufficient documentation

## 2021-01-10 DIAGNOSIS — E11319 Type 2 diabetes mellitus with unspecified diabetic retinopathy without macular edema: Secondary | ICD-10-CM | POA: Diagnosis not present

## 2021-01-10 DIAGNOSIS — N184 Chronic kidney disease, stage 4 (severe): Secondary | ICD-10-CM | POA: Insufficient documentation

## 2021-01-10 DIAGNOSIS — I130 Hypertensive heart and chronic kidney disease with heart failure and stage 1 through stage 4 chronic kidney disease, or unspecified chronic kidney disease: Secondary | ICD-10-CM | POA: Diagnosis not present

## 2021-01-10 DIAGNOSIS — E11621 Type 2 diabetes mellitus with foot ulcer: Secondary | ICD-10-CM | POA: Diagnosis present

## 2021-01-10 DIAGNOSIS — E1142 Type 2 diabetes mellitus with diabetic polyneuropathy: Secondary | ICD-10-CM | POA: Insufficient documentation

## 2021-01-10 DIAGNOSIS — Z886 Allergy status to analgesic agent status: Secondary | ICD-10-CM | POA: Insufficient documentation

## 2021-01-10 DIAGNOSIS — Z91018 Allergy to other foods: Secondary | ICD-10-CM | POA: Diagnosis not present

## 2021-01-10 DIAGNOSIS — Z881 Allergy status to other antibiotic agents status: Secondary | ICD-10-CM | POA: Diagnosis not present

## 2021-01-10 DIAGNOSIS — L97514 Non-pressure chronic ulcer of other part of right foot with necrosis of bone: Secondary | ICD-10-CM | POA: Insufficient documentation

## 2021-01-10 DIAGNOSIS — Z91013 Allergy to seafood: Secondary | ICD-10-CM | POA: Diagnosis not present

## 2021-01-10 NOTE — Progress Notes (Addendum)
[name] [name] ([id number]) Visit Report for 01/10/2021 Allergy List Details Patient Name: Date of Service: [name] [name], [name] [name] [name] R. 01/10/2021 2:45 PM Medical Record Number: [id number] Patient Account Number: [id number] Date of Birth/Sex: Treating RN: [birth year] (65 y.o. [name] [name] Primary Care [name] [name]: [name] [name] [name] Clinician: Referring [name] [name]: Treating [name] [name]/Extender: [name] [name] in Treatment: 0 Allergies Active Allergies Fish Containing Products Reaction: hives, swelling mushroom Reaction: anaphylaxis Severity: Severe penicillin Reaction: throat swelling Severity: Severe tomato, raw Reaction: hives rosemary Reaction: anaphylaxis Severity: Severe Shellfish Containing Products Reaction: hives, swelling Severity: Severe aloe vera Reaction: rash bee venom protein (honey bee) Reaction: hives, swelling Severity: Moderate acetaminophen Reaction: GI upset acyclovir Reaction: unknown naproxen Reaction: unknown doxycycline Allergy Notes Electronic Signature(s) Signed: 01/10/2021 5:40:01 PM By: [name] Hammock RN Entered By: [name] [name] on 01/10/2021 14:50:22 -------------------------------------------------------------------------------- Arrival Information Details Patient Name: Date of Service: [name] [name], [name] [name] R. 01/10/2021 2:45 PM Medical Record Number: [id number] Patient Account Number: [id number] Date of Birth/Sex: Treating RN: [name] [name] (65 y.o. [name] [name] Primary Care [name] [name]: [name] [name] [name] Clinician: Referring [name] [name]: Treating [name] [name]: [name] [name] in Treatment: 0 Visit Information Patient Arrived: Wheel Chair Arrival Time: 14:45 Accompanied By: self Transfer Assistance: None Patient Identification Verified: Yes Secondary Verification Process Completed: Yes Patient Requires Transmission-Based Precautions: No Patient Has Alerts:  No History Since Last Visit Added or deleted any medications: No Any new allergies or adverse reactions: No Had a fall or experienced change in activities of daily living that [name] affect risk of falls: No Signs or symptoms of abuse/neglect since last visito No Hospitalized since last visit: No Implantable device outside of the clinic excluding cellular tissue based products placed in the center since last visit: No Has Dressing in Place as Prescribed: Yes Pain Present Now: No Electronic Signature(s) Signed: 01/10/2021 5:40:01 PM By: [name] Hammock RN Entered By: [name] [name] on 01/10/2021 14:46:34 -------------------------------------------------------------------------------- Clinic Level of Care Assessment Details Patient Name: Date of Service: [name] [name], [name] [name] R. 01/10/2021 2:45 PM Medical Record Number: [id number] Patient Account Number: [id number] Date of Birth/Sex: Treating RN: [birth year] (65 y.o. F) Primary Care [name] [name]: [name] [name] [name] Clinician: Referring [name] [name]: Treating [name] [name]/Extender: [name] [name] in Treatment: 0 Clinic Level of Care Assessment Items TOOL 1 Quantity Score X- 1 0 Use when EandM and Procedure is performed on INITIAL visit ASSESSMENTS - Nursing Assessment / Reassessment X- 1 20 General Physical Exam (combine w/ comprehensive assessment (listed just below) when performed on new pt. evals) X- 1 25 Comprehensive Assessment (HX, ROS, Risk Assessments, Wounds Hx, etc.) ASSESSMENTS - Wound and Skin Assessment / Reassessment []  - 0 Dermatologic / Skin Assessment (not related to wound area) ASSESSMENTS - Ostomy and/or Continence Assessment and Care []  - 0 Incontinence Assessment and Management []  - 0 Ostomy Care Assessment and Management (repouching, etc.) PROCESS - Coordination of Care []  - 0 Simple Patient / Family Education for ongoing care X- 1 20 Complex (extensive) Patient / Family Education for  ongoing care X- 1 10 Staff obtains Programmer, systems, Records, [name] [name] est []  - 0 Staff telephones HHA, Nursing Homes / Clarify [name] / etc []  - 0 Routine Transfer to another Facility (non-emergent condition) []  - 0 Routine Hospital Admission (non-emergent condition) X- 1 15 New Admissions / Biomedical engineer / Ordering NPWT Apligraf, etc. , []  - 0 Emergency Hospital Admission (emergent condition) PROCESS - Special Needs []  - 0 Pediatric / Minor Patient Management []  -  0 Isolation Patient Management []  - 0 Hearing / Language / Visual special needs []  - 0 Assessment of Community assistance (transportation, D/C planning, etc.) []  - 0 Additional assistance / Altered mentation X- 1 15 Support Surface(s) Assessment (bed, cushion, seat, etc.) INTERVENTIONS - Miscellaneous []  - 0 External ear exam []  - 0 Patient Transfer (multiple staff / Civil Service fast streamer / Similar devices) []  - 0 Simple Staple / Suture removal (25 or less) []  - 0 Complex Staple / Suture removal (26 or more) []  - 0 Hypo/Hyperglycemic Management (do not check if billed separately) X- 1 15 Ankle / Brachial Index (ABI) - do not check if billed separately Has the patient been seen at the hospital within the last three years: Yes Total Score: 120 Level Of Care: New/Established - Level 4 Electronic Signature(s) Signed: 01/10/2021 5:53:54 PM By: [name] [name] Entered By: [name] [name] on 01/10/2021 16:21:34 -------------------------------------------------------------------------------- Encounter Discharge Information Details Patient Name: Date of Service: [name] [name], [name] [name] R. 01/10/2021 2:45 PM Medical Record Number: [id number] Patient Account Number: [id number] Date of Birth/Sex: Treating RN: [name] (65 y.o. [name] [name], [name] [name] Primary Care Malikah Lakey: [name] [name] [name] Clinician: Referring [name] [name]: Treating [name] [name]: [name] [name] in Treatment:  0 Encounter Discharge Information Items Post Procedure Vitals Discharge Condition: Stable Temperature (F): 98.4 Ambulatory Status: Wheelchair Pulse (bpm): 74 Discharge Destination: Home Respiratory Rate (breaths/min): 17 Transportation: Private Auto Blood Pressure (mmHg): 160/84 Accompanied By: self Schedule Follow-up Appointment: Yes Clinical Summary of Care: Electronic Signature(s) Signed: 01/10/2021 6:12:14 PM By: Deon Pilling Entered By: Deon Pilling on 01/10/2021 18:05:01 -------------------------------------------------------------------------------- Lower Extremity Assessment Details Patient Name: Date of Service: [name] [name], [name] [name] R. 01/10/2021 2:45 PM Medical Record Number: [id number] Patient Account Number: [id number] Date of Birth/Sex: Treating RN: [name] (65 y.o. [name] [name] Primary Care Jrake Rodriquez: [name] [name] [name] Clinician: Referring Shamar Kracke: Treating Levada Bowersox/Extender: [name] [name] in Treatment: 0 Edema Assessment Assessed: Shirlyn Goltz: Yes] Patrice Paradise: Yes] Edema: [Left: Yes] [Right: Yes] Calf Left: Right: Point of Measurement: 35 cm From Medial Instep 34 cm 34 cm Ankle Left: Right: Point of Measurement: 8 cm From Medial Instep 21.5 cm 21.5 cm Vascular Assessment Pulses: Dorsalis Pedis Palpable: [Left:Yes] [Right:Yes] Posterior Tibial Palpable: [Left:Yes] [Right:Yes] Blood Pressure: Brachial: [Left:160] [Right:160] Ankle: [Left:Dorsalis Pedis: 184 1.15] [Right:Dorsalis Pedis: 180 1.13] Electronic Signature(s) Signed: 01/10/2021 5:40:01 PM By: [name] Hammock RN Entered By: [name] [name] on 01/10/2021 15:24:07 -------------------------------------------------------------------------------- Multi Wound Chart Details Patient Name: Date of Service: [name] [name], [name] [name] R. 01/10/2021 2:45 PM Medical Record Number: [id number] Patient Account Number: [id number] Date of Birth/Sex: Treating RN: [name] (65  y.o. F) Primary Care Topanga Alvelo: [name] [name] [name] Clinician: Referring Infant Doane: Treating Aireona Torelli/Extender: [name] [name] in Treatment: 0 Vital Signs Height(in): 61 Capillary Blood Glucose(mg/dl): 117 Weight(lbs): 162 Pulse(bpm): 80 Body Mass Index(BMI): 31 Blood Pressure(mmHg): 160/84 Temperature(F): 98.4 Respiratory Rate(breaths/min): 17 Photos: [10:No Photos Right Calcaneus] [9:No Photos Left Calcaneus] [N/A:N/A N/A] Wound Location: [10:Gradually Appeared] [9:Gradually Appeared] [N/A:N/A] Wounding Event: [10:Diabetic Wound/Ulcer of the Lower] [9:Diabetic Wound/Ulcer of the Lower] [N/A:N/A] Primary Etiology: [10:Extremity Cataracts, Anemia, Sleep Apnea,] [9:Extremity Cataracts, Anemia, Sleep Apnea,] [N/A:N/A] Comorbid History: [10:Coronary Artery Disease, Hypertension, Myocardial Infarction, Hypertension, Myocardial Infarction, Type II Diabetes, Osteoarthritis, Osteomyelitis, Neuropathy 09/29/2020] [9:Coronary Artery Disease, Type II Diabetes, Osteoarthritis,  Osteomyelitis, Neuropathy 01/11/2019] [N/A:N/A] Date Acquired: [10:0] [9:0] [N/A:N/A] Weeks of Treatment: [10:Open] [9:Open] [N/A:N/A] Wound Status: [10:3x4.5x1.4] [9:4x3.7x2.2] [N/A:N/A] Measurements L x W x D (cm) [10:10.603] [9:11.624] [N/A:N/A] A (cm) : rea [10:14.844] [9:25.573] [  N/A:N/A] Volume (cm) : [10:N/A] [9:0.00%] [N/A:N/A] % Reduction in A [10:rea: N/A] [9:0.00%] [N/A:N/A] % Reduction in Volume: [10:12] [9:12] Starting Position 1 (o'clock): [10:12] [9:4] Ending Position 1 (o'clock): [10:0.4] [9:1.7] Maximum Distance 1 (cm): [10:Yes] [9:Yes] [N/A:N/A] Undermining: [10:Grade 2] [9:Grade 2] [N/A:N/A] Classification: [10:Medium] [9:Medium] [N/A:N/A] Exudate A mount: [10:Serosanguineous] [9:Serosanguineous] [N/A:N/A] Exudate Type: [10:red, brown] [9:red, brown] [N/A:N/A] Exudate Color: [10:Distinct, outline attached] [9:Distinct, outline attached] [N/A:N/A] Wound Margin:  [10:Small (1-33%)] [9:Medium (34-66%)] [N/A:N/A] Granulation A mount: [10:Red, Pink] [9:Red, Pink] [N/A:N/A] Granulation Quality: [10:Large (67-100%)] [9:Medium (34-66%)] [N/A:N/A] Necrotic A mount: [10:Eschar, Adherent Slough] [9:Eschar, Adherent Slough] [N/A:N/A] Necrotic Tissue: [10:Fat Layer (Subcutaneous Tissue): Yes Fascia: No] [N/A:N/A] Exposed Structures: [10:Fascia: No Tendon: No Muscle: No Joint: No Bone: No None] [9:Fat Layer (Subcutaneous Tissue): No Tendon: No Muscle: No Joint: No Bone: No None] [N/A:N/A] Epithelialization: [10:Debridement - Excisional] [9:Debridement - Excisional] [N/A:N/A] Debridement: Pre-procedure Verification/Time Out 16:00 [9:16:00] [N/A:N/A] Taken: [10:[name]] [9:[name]] [N/A:N/A] Pain Control: [10:Bone, Subcutaneous] [9:Muscle, Subcutaneous] [N/A:N/A] Tissue Debrided: [10:Skin/Subcutaneous] [9:Skin/Subcutaneous Tissue/Muscle] [N/A:N/A] Level: [10:Tissue/Muscle/Bone 13.5] [9:14.8] [N/A:N/A] Debridement A (sq cm): [10:rea Blade, Forceps, Scissors] [9:Blade, Curette] [N/A:N/A] Instrument: [10:Moderate] [9:Moderate] [N/A:N/A] Bleeding: [10:Silver Nitrate] [9:Silver Nitrate] [N/A:N/A] Hemostasis Achieved: Debridement Treatment Response: Procedure was tolerated well [9:Procedure was tolerated well] [N/A:N/A] Post Debridement Measurements L x 3x4.5x1.4 [9:4x3.7x2.2] [N/A:N/A] W x D (cm) [10:14.844] [9:25.573] [N/A:N/A] Post Debridement Volume: (cm) [10:Debridement] [9:Debridement] [N/A:N/A] Treatment Notes Electronic Signature(s) Signed: 01/10/2021 5:58:13 PM By: Linton Ham MD Entered By: Linton Ham on 01/10/2021 17:24:54 -------------------------------------------------------------------------------- Multi-Disciplinary Care Plan Details Patient Name: Date of Service: [name] [name], [name] [name] R. 01/10/2021 2:45 PM Medical Record Number: [id number] Patient Account Number: [id number] Date of Birth/Sex: Treating RN: [birth year][name] (65 y.o. F) Primary Care  Deloros Beretta: [name] [name] [name] Clinician: Referring Rogerick Baldwin: Treating Jeanett Antonopoulos/Extender: [name] [name] in Treatment: 0 Active Inactive Pressure Nursing Diagnoses: Knowledge deficit related to management of pressures ulcers Goals: Patient/caregiver will verbalize understanding of pressure ulcer management Date Initiated: 01/10/2021 Target Resolution Date: 02/14/2021 Goal Status: Active Interventions: Assess offloading mechanisms upon admission and as needed Assess potential for pressure ulcer upon admission and as needed Provide education on pressure ulcers Notes: Wound/Skin Impairment Nursing Diagnoses: Impaired tissue integrity Goals: Patient/caregiver will verbalize understanding of skin care regimen Date Initiated: 01/10/2021 Target Resolution Date: 02/14/2021 Goal Status: Active Ulcer/skin breakdown will have a volume reduction of 30% by week 4 Date Initiated: 01/10/2021 Target Resolution Date: 02/14/2021 Goal Status: Active Interventions: Assess patient/caregiver ability to obtain necessary supplies Assess patient/caregiver ability to perform ulcer/skin care regimen upon admission and as needed Assess ulceration(s) every visit Provide education on ulcer and skin care Treatment Activities: Skin care regimen initiated : 01/10/2021 Topical wound management initiated : 01/10/2021 Notes: Electronic Signature(s) Signed: 01/10/2021 5:53:54 PM By: [name] [name] Entered By: [name] [name] on 01/10/2021 16:01:59 -------------------------------------------------------------------------------- Pain Assessment Details Patient Name: Date of Service: [name] [name] [name], [name] [name] R. 01/10/2021 2:45 PM Medical Record Number: [id number] Patient Account Number: [id number] Date of Birth/Sex: Treating RN: [name] (65 y.o. [name] [name] Primary Care Yaminah Clayborn: [name] [name] [name] Clinician: Referring Zedrick Springsteen: Treating Lamis Behrmann/Extender: [name] [name] in Treatment: 0 Active Problems Location of Pain Severity and Description of Pain Patient Has Paino No Site Locations Pain Management and Medication Current Pain Management: Electronic Signature(s) Signed: 01/10/2021 5:40:01 PM By: [name] Hammock RN Entered By: [name] [name] on 01/10/2021 15:02:14 -------------------------------------------------------------------------------- Patient/Caregiver Education Details Patient Name: Date of Service: [name] [name], [name] [name] R. 3/14/2022andnbsp2:45 PM Medical Record Number: [id number] Patient Account  Number: [id number] Date of Birth/Gender: Treating RN: [name] (65 y.o. F) Primary Care Physician: [name] [name] [name] Clinician: Referring Physician: Treating Physician/Extender: [name] [name] in Treatment: 0 Education Assessment Education Provided To: Patient Education Topics Provided Offloading: Methods: Demonstration, Explain/Verbal, Printed Responses: State content correctly Pressure: Methods: Explain/Verbal, Printed Responses: State content correctly Wound/Skin Impairment: Methods: Explain/Verbal, Printed Responses: State content correctly Electronic Signature(s) Signed: 01/10/2021 5:53:54 PM By: [name] [name] Entered By: [name] [name] on 01/10/2021 16:03:20 -------------------------------------------------------------------------------- Wound Assessment Details Patient Name: Date of Service: [name] [name], [name] [name] R. 01/10/2021 2:45 PM Medical Record Number: [id number] Patient Account Number: [id number] Date of Birth/Sex: Treating RN: [birth year] [name] (65 y.o. F) Primary Care Kinneth Fujiwara: [name] [name] [name] Clinician: Referring Gerald Honea: Treating Shafiq Larch/Extender: [name] [name] in Treatment: 0 Wound Status Wound Number: 10 Primary Diabetic Wound/Ulcer of the Lower Extremity Etiology: Wound Location: Right, Lateral Foot Wound  Open Wounding Event: Gradually Appeared Status: Date Acquired: 09/29/2020 Comorbid Cataracts, Anemia, Sleep Apnea, Coronary Artery Disease, Weeks Of Treatment: 0 History: Hypertension, Myocardial Infarction, Type II Diabetes, Clustered Wound: No Osteoarthritis, Osteomyelitis, Neuropathy Photos Wound Measurements Length: (cm) 3 Width: (cm) 4.5 Depth: (cm) 1.4 Area: (cm) 10.603 Volume: (cm) 14.844 % Reduction in Area: 0% % Reduction in Volume: 0% Epithelialization: None Tunneling: No Undermining: Yes Starting Position (o'clock): 12 Ending Position (o'clock): 12 Maximum Distance: (cm) 0.4 Wound Description Classification: Grade 2 Wound Margin: Distinct, outline attached Exudate Amount: Medium Exudate Type: Serosanguineous Exudate Color: red, brown Foul Odor After Cleansing: No Slough/Fibrino Yes Wound Bed Granulation Amount: Small (1-33%) Exposed Structure Granulation Quality: Red, Pink Fascia Exposed: No Necrotic Amount: Large (67-100%) Fat Layer (Subcutaneous Tissue) Exposed: Yes Necrotic Quality: Eschar Tendon Exposed: No Muscle Exposed: No Joint Exposed: No Bone Exposed: No Treatment Notes Wound #10 (Foot) Wound Laterality: Right, Lateral Cleanser Soap and Water Discharge Instruction: [name] shower and wash wound with dial antibacterial soap and water prior to dressing change. Peri-Wound Care Topical Primary Dressing KerraCel Ag Gelling Fiber Dressing, 4x5 in (silver alginate) Discharge Instruction: Apply silver alginate to wound bed as instructed Secondary Dressing Woven Gauze Sponge, Non-Sterile 4x4 in Discharge Instruction: Apply over primary dressing as directed. ABD Pad, 5x9 Discharge Instruction: Apply over primary dressing as directed. Secured With [employer], 4.5x3.1 (in/yd) Discharge Instruction: Secure with Kerlix as directed. Transpore Surgical Tape, 2x10 (in/yd) Discharge Instruction: Secure dressing with tape as directed. Compression  Wrap Compression Stockings Add-Ons Electronic Signature(s) Signed: 01/11/2021 5:27:17 PM By: [name] Previous Signature: 01/10/2021 5:40:01 PM Version By: [name] Hammock RN Entered By: [name] on 01/11/2021 17:12:17 -------------------------------------------------------------------------------- Wound Assessment Details Patient Name: Date of Service: [name] [name], [name] [name] R. 01/10/2021 2:45 PM Medical Record Number: [id number] Patient Account Number: [id number] Date of Birth/Sex: Treating RN: [name] (65 y.o. [name] [name] Primary Care Janaysha Depaulo: [name] [name] [name] Clinician: Referring Rainna Nearhood: Treating Justin Buechner/Extender: [name] [name] in Treatment: 0 Wound Status Wound Number: 9 Primary Diabetic Wound/Ulcer of the Lower Extremity Etiology: Wound Location: Left Calcaneus Wound Open Wounding Event: Gradually Appeared Status: Date Acquired: 01/11/2019 Comorbid Cataracts, Anemia, Sleep Apnea, Coronary Artery Disease, Weeks Of Treatment: 0 History: Hypertension, Myocardial Infarction, Type II Diabetes, Clustered Wound: No Osteoarthritis, Osteomyelitis, Neuropathy Photos Wound Measurements Length: (cm) 4 Width: (cm) 3.7 Depth: (cm) 2.2 Area: (cm) 11.624 Volume: (cm) 25.573 % Reduction in Area: 0% % Reduction in Volume: 0% Epithelialization: None Tunneling: No Undermining: Yes Starting Position (o'clock): 12 Ending Position (o'clock): 4 Maximum Distance: (cm) 1.7 Wound Description Classification: Grade  2 Wound Margin: Distinct, outline attached Exudate Amount: Medium Exudate Type: Serosanguineous Exudate Color: red, brown Foul Odor After Cleansing: No Slough/Fibrino Yes Wound Bed Granulation Amount: Medium (34-66%) Exposed Structure Granulation Quality: Red, Pink Fascia Exposed: No Necrotic Amount: Medium (34-66%) Fat Layer (Subcutaneous Tissue) Exposed: No Necrotic Quality: Eschar, Adherent  Slough Tendon Exposed: No Muscle Exposed: No Joint Exposed: No Bone Exposed: No Treatment Notes Wound #9 (Calcaneus) Wound Laterality: Left Cleanser Soap and Water Discharge Instruction: [name] shower and wash wound with dial antibacterial soap and water prior to dressing change. Peri-Wound Care Topical Primary Dressing KerraCel Ag Gelling Fiber Dressing, 4x5 in (silver alginate) Discharge Instruction: Apply silver alginate to wound bed as instructed Secondary Dressing Woven Gauze Sponge, Non-Sterile 4x4 in Discharge Instruction: Apply over primary dressing as directed. ABD Pad, 5x9 Discharge Instruction: Apply over primary dressing as directed. Secured With [employer], 4.5x3.1 (in/yd) Discharge Instruction: Secure with Kerlix as directed. Transpore Surgical Tape, 2x10 (in/yd) Discharge Instruction: Secure dressing with tape as directed. Compression Wrap Compression Stockings Add-Ons Electronic Signature(s) Signed: 01/11/2021 5:27:17 PM By: [name] Signed: 01/12/2021 5:17:38 PM By: [name] Hammock RN Previous Signature: 01/10/2021 5:40:01 PM Version By: [name] Hammock RN Entered By: [name] on 01/11/2021 17:11:52 -------------------------------------------------------------------------------- Vitals Details Patient Name: Date of Service: [name] [name], [name] [name] R. 01/10/2021 2:45 PM Medical Record Number: [id number] Patient Account Number: [id number] Date of Birth/Sex: Treating RN: [birth year][name] (66 y.o. [name] [name] Primary Care Jameila Keeny: [name] [name] [name] Clinician: Referring Trenace Coughlin: Treating Kenetha Cozza/Extender: [name] [name] in Treatment: 0 Vital Signs Time Taken: 14:46 Temperature (F): 98.4 Height (in): 61 Pulse (bpm): 74 Source: Stated Respiratory Rate (breaths/min): 17 Weight (lbs): 162 Blood Pressure (mmHg): 160/84 Source: Stated Capillary Blood Glucose (mg/dl): 117 Body Mass Index (BMI):  30.6 Reference Range: 80 - 120 mg / dl Electronic Signature(s) Signed: 01/10/2021 5:40:01 PM By: [name] Hammock RN Entered By: [name] [name] on 01/10/2021 14:50:05

## 2021-01-10 NOTE — Progress Notes (Signed)
DELPHIA, KAYLOR ([id number]) Visit Report for 01/10/2021 Abuse/Suicide Risk Screen Details Patient Name: Date of Service: [name] [name] RE, [name] 01/10/2021 2:45 PM Medical Record Number: [id number] Patient Account Number: [id number] Date of Birth/Sex: Treating RN: [name] (65 y.o. [name] [name], [name] [name] Primary Care [name] [name]: [name] [name] Other Clinician: Referring [name] [name]: Treating [name] [name]/Extender: [name] [name] in Treatment: 0 Abuse/Suicide Risk Screen Items Answer ABUSE RISK SCREEN: Has anyone close to you tried to hurt or harm you recentlyo No Do you feel uncomfortable with anyone in your familyo No Has anyone forced you do things that you didnt want to doo No Electronic Signature(s) Signed: 01/10/2021 5:40:01 PM By: [name] Hammock RN Entered By: [name] [name] on 01/10/2021 14:58:20 -------------------------------------------------------------------------------- Activities of Daily Living Details Patient Name: Date of Service: [name] [name] RE, [name] 01/10/2021 2:45 PM Medical Record Number: [id number] Patient Account Number: [id number] Date of Birth/Sex: Treating RN: [birth year] (65 y.o. [name] [name], [name] [name] Primary Care [name] [name]: [name] [name] Other Clinician: Referring Brizeida Mcmurry: Treating Laticha Ferrucci/Extender: [name] [name] in Treatment: 0 Activities of Daily Living Items Answer Activities of Daily Living (Please select one for each item) Drive Automobile Not Able T Medications ake Completely Able Use T elephone Completely Able Care for Appearance Completely Able Use T oilet Completely Able Bath / Shower Completely Able Dress Self Completely Able Feed Self Completely Able Walk Need Assistance Get In / Out Bed Completely Able Housework Need Assistance Prepare Meals Completely [place] Need Assistance Shop for Self Need Assistance Electronic Signature(s) Signed: 01/10/2021 5:40:01 PM By:  [name] Hammock RN Entered By: [name] [name] on 01/10/2021 14:58:49 -------------------------------------------------------------------------------- Education Screening Details Patient Name: Date of Service: [name], [name] 01/10/2021 2:45 PM Medical Record Number: [id number] Patient Account Number: [id number] Date of Birth/Sex: Treating RN: [birth year] (65 y.o. [name] [name], [name] [name] Primary Care [name] [name]: [name] [name] Other Clinician: Referring [name] [name]: Treating Kelbi Renstrom/Extender: [name] [name] in Treatment: 0 Primary Learner Assessed: Patient Learning Preferences/Education Level/Primary Language Learning Preference: Explanation, Demonstration, Communication Board, Printed Material Highest Education Level: College or Above Preferred Language: English Cognitive Barrier Language Barrier: No Translator Needed: No Memory Deficit: No Emotional Barrier: No Cultural/Religious Beliefs Affecting Medical Care: No Physical Barrier Impaired Vision: No Impaired Hearing: No Decreased Hand dexterity: No Knowledge/Comprehension Knowledge Level: High Comprehension Level: High Ability to understand written instructions: High Ability to understand verbal instructions: High Motivation Anxiety Level: Calm Cooperation: Cooperative Education Importance: Denies Need Interest in Health Problems: Asks Questions Perception: Coherent Willingness to Engage in Self-Management High Activities: Readiness to Engage in Self-Management High Activities: Electronic Signature(s) Signed: 01/10/2021 5:40:01 PM By: [name] Hammock RN Entered By: [name] [name] on 01/10/2021 14:59:37 -------------------------------------------------------------------------------- Fall Risk Assessment Details Patient Name: Date of Service: [name], [name] 01/10/2021 2:45 PM Medical Record Number: [id number] Patient Account Number: [id number] Date of Birth/Sex: Treating  RN: [name] [name] (64 y.o. [name] [name], [name] [name] Primary Care [name] [name]: [name] [name] Other Clinician: Referring Shaquon Gropp: Treating [name] [name]/Extender: [name] [name] in Treatment: 0 Fall Risk Assessment Items Have you had 2 or more falls in the last 12 monthso 0 No Have you had any fall that resulted in injury in the last 12 monthso 0 No FALLS RISK SCREEN History of falling - immediate or within 3 months 0 No Secondary diagnosis (Do you have 2 or more medical diagnoseso) 0 No Ambulatory aid None/bed rest/wheelchair/nurse 0 No Crutches/cane/walker 0 No Furniture 0 No Intravenous therapy Access/Saline/Heparin Lock 0 No Gait/Transferring Normal/ bed  rest/ wheelchair 0 No Weak (short steps with or without shuffle, stooped but able to lift head while walking, may seek 0 No support from furniture) Impaired (short steps with shuffle, may have difficulty arising from chair, head down, impaired 0 No balance) Mental Status Oriented to own ability 0 No Electronic Signature(s) Signed: 01/10/2021 5:40:01 PM By: [name] Hammock RN Entered By: [name] [name] on 01/10/2021 15:01:12 -------------------------------------------------------------------------------- Foot Assessment Details Patient Name: Date of Service: [name], [name] 01/10/2021 2:45 PM Medical Record Number: [id number] Patient Account Number: [id number] Date of Birth/Sex: Treating RN: [birth year] (65 y.o. [name] [name], [name] [name] Primary Care [name] [name]: [name] [name] Other Clinician: Referring [name] [name]: Treating [name] [name]/Extender: [name] [name] in Treatment: 0 Foot Assessment Items Site Locations + = Sensation present, - = Sensation absent, C = Callus, U = Ulcer R = Redness, W = Warmth, M = Maceration, PU = Pre-ulcerative lesion F = Fissure, S = Swelling, D = Dryness Assessment Right: Left: Other Deformity: No No Prior Foot Ulcer: No No Prior Amputation:  No No Charcot Joint: No No Ambulatory Status: Ambulatory With Help Assistance Device: Wheelchair Gait: Steady Electronic Signature(s) Signed: 01/10/2021 5:40:01 PM By: [name] Hammock RN Entered By: [name] [name] on 01/10/2021 15:15:04 -------------------------------------------------------------------------------- Nutrition Risk Screening Details Patient Name: Date of Service: [name], [name] 01/10/2021 2:45 PM Medical Record Number: [id number] Patient Account Number: [id number] Date of Birth/Sex: Treating RN: [name] (65 y.o. [name] [name], [name] [name] Primary Care Niti Leisure: [name] [name] Other Clinician: Referring Enriqueta Augusta: Treating Nora Rooke/Extender: [name] [name] in Treatment: 0 Height (in): 61 Weight (lbs): 162 Body Mass Index (BMI): 30.6 Nutrition Risk Screening Items Score Screening NUTRITION RISK SCREEN: I have an illness or condition that made me change the kind and/or amount of food I eat 0 No I eat fewer than two meals per day 0 No I eat few fruits and vegetables, or milk products 0 No I have three or more drinks of beer, liquor or wine almost every day 0 No I have tooth or mouth problems that make it hard for me to eat 0 No I don't always have enough money to buy the food I need 0 No I eat alone most of the time 0 No I take three or more different prescribed or over-the-counter drugs a day 0 No Without wanting to, I have lost or gained 10 pounds in the last six months 0 No I am not always physically able to shop, cook and/or feed myself 0 No Nutrition Protocols Good Risk Protocol 0 No interventions needed Moderate Risk Protocol High Risk Proctocol Risk Level: Good Risk Score: 0 Electronic Signature(s) Signed: 01/10/2021 5:40:01 PM By: [name] Hammock RN Entered By: [name] [name] on 01/10/2021 15:01:21

## 2021-01-10 NOTE — Telephone Encounter
ED Discharge Follow Up  Reached patient: No, pt transferred to [facility]    Admission Information  [facility] Name : Rhonda Fletcher  ED Admission Date: 01/02/21 ED Discharge Date: 01/04/21   Admission Diagnosis: Paranoia  Discharge Diagnosis: [facility] Services: Unplanned  Today's call is 6 (calendar) days post discharge    Medication Reconciliation  Changes to pre-ED visit medications? No    ? albuterol sulfate (PROAIR HFA) 90 mcg/actuation HFA aerosol inhaler Inhale two puffs by mouth into the lungs every 6 hours as needed.   ? alcohol swabs (BD SINGLE USE SWABS REGULAR) USE TO CLEAN SKIN BEFORE FINGERSTICK   ? amLODIPine (NORVASC) 10 mg tablet Take one tablet by mouth daily.   ? betamethasone valerate (VALISONE) 0.1 % topical cream APPLY  TOPICALLY TO AFFECTED AREA DAILY.   ? betamethasone valerate (VALISONE) 0.1 % topical ointment APPLY  TOPICALLY TO AFFECTED AREA DAILY.   ? Blood Glucose Control, Normal soln Use in glucose meter, Accu-check Aviva   ? blood sugar diagnostic (ACCU-CHEK AVIVA PLUS TEST STRP) test strip Use one strip as directed twice daily before meals. 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 cetirizine (ZYRTEC) 10 mg tablet Take 10 mg by mouth daily.     ? cholecalciferol(+) (VITAMIN D-3) 2,000 unit tablet Take 1 Tab by mouth daily.   ? cloNIDine HCL (CATAPRES) 0.2 mg tablet Take 1.5 tablets by mouth three times daily.   ? cyanocobalamin (VITAMIN B-12) 500 mcg tablet Take 500 mcg by mouth daily.   ? cyclobenzaprine (FLEXERIL) 5 mg tablet TAKE 1 TABLET AT BEDTIME (Patient taking differently: Sometimes takes twice daily)   ? famotidine (PEPCID) 20 mg tablet TAKE 1 TABLET TWICE DAILY   ? ferrous sulfate (FEOSOL) 325 mg (65 mg iron) tablet Take 325 mg by mouth every 48 hours.   ? fluconazole (DIFLUCAN) 150 mg tablet Take one tablet by mouth daily.   ? fluticasone propionate (FLONASE) 50 mcg/actuation nasal spray, suspension Shake bottle gently before using.  Indications: Use 2 SPRAYS IN EACH NOSTRIL AS DIRECTED DAILY   ? glimepiride (AMARYL) 2 mg tablet Take one tablet by mouth daily.   ? lancets MISC Use one each as directed daily before breakfast. Diag: E11.29   ? metFORMIN (GLUCOPHAGE) 1,000 mg tablet TAKE 1 TABLET TWICE DAILY WITH MEALS   ? pantoprazole DR (PROTONIX) 40 mg tablet TAKE 1 TABLET EVERY DAY       Scheduling Follow-up Appointment  Upcoming appointment date and time and with whom scheduled:   Future Appointments   Date Time Provider Department Center   01/11/2021  3:00 PM Shela Leff, MD [facility]   02/25/2021 10:00 AM Shela Leff, MD [facility]     When was patient?s last PCP visit: 12/31/2020  PCP primary location: [facility]  PCP appointment scheduled? Yes, Date: 01/11/21   Specialist appointment scheduled? No  Both PCP and Specialist appointment scheduled: No  MyChart message sent? Active in MyChart. No message sent.     Staci Acosta, RN

## 2021-01-10 NOTE — Telephone Encounter
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 No    Hospital Services: Unplanned  Today's call is 1(business) days post discharge    Discharge Instruction Review   Did patient receive and understand discharge instructions? Yes    Home Health ordered? No                 Agency name/telephone number: n/a   Has Home Health agency contacted patient? No   Caregiver assistance in the home? No   Are there concerns regarding the patient's ADL'S? No  Is patient a fall risk? No    Special diet? Yes If yes, type: Consistent carb      Medication Reconciliation    Changes to pre-hospital medications? Yes     Ordered Prescriptions  Prescription Sig Dispensed Refills Start Date End Date   risperiDONE (RisperDAL) 1 MG/ML oral solution?  Take 0.5 mL (0.5 mg total) by mouth 2 (two) times a day for 30 days 30 mL?  0 01/07/2021 02/06/2021   linagliptin (TRADJENTA) 5 MG?  Take 1 tablet (5 mg total) by mouth daily for 30 days 30 tablet?  0 01/08/2021 02/07/2021   cyclobenzaprine (FLEXERIL) 10 MG tablet?  Take 1 tablet (10 mg total) by mouth every 6 (six) hours as needed for muscle spasms (pain in left atrm and shoulder) for up to 30 days 30 tablet?  0 01/07/2021 02/06/2021       Were new prescriptions filled?No,  due to Concerns about new medications - pt declines to take risperdal as there is nothing wrong with me. When asked about linagliptin pt states she is back on her Metformin.     Meds reviewed and reconciled?No, some medications individually reviewed but not entire list. Pt denies any other questions or concerns regarding medications.      ? albuterol sulfate (PROAIR HFA) 90 mcg/actuation HFA aerosol inhaler Inhale two puffs by mouth into the lungs every 6 hours as needed.   ? alcohol swabs (BD SINGLE USE SWABS REGULAR) USE TO CLEAN SKIN BEFORE FINGERSTICK   ? amLODIPine (NORVASC) 10 mg tablet Take one tablet by mouth daily.   ? betamethasone valerate (VALISONE) 0.1 % topical cream APPLY  TOPICALLY TO AFFECTED AREA DAILY.   ? betamethasone valerate (VALISONE) 0.1 % topical ointment APPLY  TOPICALLY TO AFFECTED AREA DAILY.   ? Blood Glucose Control, Normal soln Use in glucose meter, Accu-check Aviva   ? blood sugar diagnostic (ACCU-CHEK AVIVA PLUS TEST STRP) test strip Use one strip as directed twice daily before meals. 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 cetirizine (ZYRTEC) 10 mg tablet Take 10 mg by mouth daily.     ? cholecalciferol(+) (VITAMIN D-3) 2,000 unit tablet Take 1 Tab by mouth daily.   ? cloNIDine HCL (CATAPRES) 0.2 mg tablet Take 1.5 tablets by mouth three times daily.   ? cyanocobalamin (VITAMIN B-12) 500 mcg tablet Take 500 mcg by mouth daily.   ? cyclobenzaprine (FLEXERIL) 5 mg tablet TAKE 1 TABLET AT BEDTIME (Patient taking differently: Sometimes takes twice daily)   ?  famotidine (PEPCID) 20 mg tablet TAKE 1 TABLET TWICE DAILY   ? ferrous sulfate (FEOSOL) 325 mg (65 mg iron) tablet Take 325 mg by mouth every 48 hours.   ? fluconazole (DIFLUCAN) 150 mg tablet Take one tablet by mouth daily.   ? fluticasone propionate (FLONASE) 50 mcg/actuation nasal spray, suspension Shake bottle gently before using.  Indications: Use 2 SPRAYS IN EACH NOSTRIL AS DIRECTED DAILY   ? glimepiride (AMARYL) 2 mg tablet Take one tablet by mouth daily.   ? lancets MISC Use one each as directed daily before breakfast. Diag: E11.29   ? metFORMIN (GLUCOPHAGE) 1,000 mg tablet TAKE 1 TABLET TWICE DAILY WITH MEALS   ? pantoprazole DR (PROTONIX) 40 mg tablet TAKE 1 TABLET EVERY DAY         Understanding Condition   Having any current symptoms? No, reports she is overall feeling much better.  Denies any symptoms of high/low sugar but reports it has been running higher than normal since discharge. Denies CP, HA, or any other symptoms at this time.     Fasting CBG during TOC call: 88  BP today: 136/82    Patient understands when to seek additional medical care? Yes   Other instructions provided : Pt asked about lab results she received a phone call for. Reviewed note from Dr. Johny Shears from those results that read:  Pt notified - see telephone call and letter to pt:  -Hgb 11.0  -Creat higher at 2.10 - recommend Renal US.  -Urine culture showed yeast but no bacteria (already treated with Diflucan).    Pt verbalizes understanding and has no questions at this time. Will discuss any concerns she thinks of at appt tomorrow.      Scheduling Follow-up Appointment   Upcoming appointment date and time and with whom scheduled:   Future Appointments   Date Time Provider Department Center   01/11/2021  3:00 PM Shela Leff, MD [facility]   02/25/2021 10:00 AM Shela Leff, MD [facility]     PCP appointment scheduled?Yes, Date: 01/11/21   PCP primary location: [facility]  Specialist appointment scheduled? No  Both PCP and Specialist appointment scheduled: No  Is assistance with transportation needed?No   MyChart message sent? Active in MyChart. No message sent.     Staci Acosta, RN

## 2021-01-10 NOTE — Progress Notes (Signed)
IRVA, LOSER ([id number]) Visit Report for 01/10/2021 Chief Complaint Document Details Patient Name: Date of Service: [name] [name] [name], [place] NNA [name]. 01/10/2021 2:45 PM Medical Record Number: [id number] Patient Account Number: [id number] Date of Birth/Sex: Treating RN: [birth year] (65 y.o. F) Primary Care Provider: [name] Other Clinician: Referring Provider: Treating Provider/Extender: [name] in Treatment: 0 Information Obtained from: Patient Chief Complaint Left heel ulcer 01/10/2021; patient returns to clinic with 2 large wounds on her bilateral heels Electronic Signature(s) Signed: 01/10/2021 5:58:13 PM By: Linton Ham MD Entered By: Linton Ham on 01/10/2021 17:26:29 -------------------------------------------------------------------------------- Debridement Details Patient Name: Date of Service: [name] [name], [name] [name]. 01/10/2021 2:45 PM Medical Record Number: [id number] Patient Account Number: [id number] Date of Birth/Sex: Treating RN: 11-14-1955 (65 y.o. F) Primary Care Provider: [name] Other Clinician: Referring Provider: Treating Provider/Extender: [name] in Treatment: 0 Debridement Performed for Assessment: Wound #10 Right,Lateral Foot Performed By: Physician Ricard Dillon., MD Debridement Type: Debridement Severity of Tissue Pre Debridement: Bone involvement without necrosis Level of Consciousness (Pre-procedure): Awake and Alert Pre-procedure Verification/Time Out Yes - 16:00 Taken: Start Time: 16:01 Pain Control: Other : Benzocaine T Area Debrided (L x W): otal 3 (cm) x 4.5 (cm) = 13.5 (cm) Tissue and other material debrided: Eschar, Subcutaneous, Skin: Dermis Level: Skin/Subcutaneous Tissue Debridement Description: Excisional Instrument: Blade, Forceps, Scissors Bleeding: Moderate Hemostasis Achieved: Silver Nitrate End Time: 16:06 Response to Treatment: Procedure  was tolerated well Level of Consciousness (Post- Awake and Alert procedure): Post Debridement Measurements of Total Wound Length: (cm) 3 Width: (cm) 4.5 Depth: (cm) 1.4 Volume: (cm) 14.844 Character of Wound/Ulcer Post Debridement: Stable Severity of Tissue Post Debridement: Muscle involvement without necrosis Post Procedure Diagnosis Same as Pre-procedure Electronic Signature(s) Signed: 01/10/2021 5:58:13 PM By: Linton Ham MD Entered By: Linton Ham on 01/10/2021 17:25:30 -------------------------------------------------------------------------------- Debridement Details Patient Name: Date of Service: [place], [name] [name]. 01/10/2021 2:45 PM Medical Record Number: [id number] Patient Account Number: [id number] Date of Birth/Sex: Treating RN: [birth year] (65 y.o. F) Primary Care Provider: [name] Other Clinician: Referring Provider: Treating Provider/Extender: [name] in Treatment: 0 Debridement Performed for Assessment: Wound #9 Left Calcaneus Performed By: Physician Ricard Dillon., MD Debridement Type: Debridement Severity of Tissue Pre Debridement: Fat layer exposed Level of Consciousness (Pre-procedure): Awake and Alert Pre-procedure Verification/Time Out Yes - 16:00 Taken: Start Time: 16:06 Pain Control: Other : Benzocaine T Area Debrided (L x W): otal 4 (cm) x 3.7 (cm) = 14.8 (cm) Tissue and other material debrided: Callus, Slough, Subcutaneous, Skin: Dermis , Slough Level: Skin/Subcutaneous Tissue Debridement Description: Excisional Instrument: Blade, Curette Bleeding: Moderate Hemostasis Achieved: Silver Nitrate End Time: 16:11 Response to Treatment: Procedure was tolerated well Level of Consciousness (Post- Awake and Alert procedure): Post Debridement Measurements of Total Wound Length: (cm) 4 Width: (cm) 3.7 Depth: (cm) 2.2 Volume: (cm) 25.573 Character of Wound/Ulcer Post Debridement:  Stable Severity of Tissue Post Debridement: Fat layer exposed Post Procedure Diagnosis Same as Pre-procedure Electronic Signature(s) Signed: 01/10/2021 5:58:13 PM By: Linton Ham MD Entered By: Linton Ham on 01/10/2021 17:38:51 -------------------------------------------------------------------------------- HPI Details Patient Name: Date of Service: [name] [name], [name] [name]. 01/10/2021 2:45 PM Medical Record Number: [id number] Patient Account Number: [id number] Date of Birth/Sex: Treating RN: [birth year] (65 y.o. F) Primary Care Provider: [name] Other Clinician: Referring Provider: Treating Provider/Extender: [name] in Treatment: 0 History of Present Illness HPI Description: ADMISSION 11/12/2018 This is a 65 year old  woman with type 2 diabetes and diabetic neuropathy. She has been dealing with a left heel plantar wound for roughly 2 years. She states this started when she pulled some skin off the area and it progressed into a wound. She also has an area on the tip of her left great toe for 1 year. She has been largely followed by Dr. Sharol Given and she had an excision of bone in the left heel in 2017. I do not see microbiology from this excision or pathology. Apparently this wound never really closed. She most recently has been using Silvadene cream. Offloading this with a scooter. The last MRI I see was in June 2018 which did not show osteomyelitis at that time. Apparently this wound is never really progressed towards healing. During her last review by Dr. Sharol Given in October it was recommended that she undergo a left BKA and she refused. She went to see a second orthopedic consult at [facility] who am recommended conservative wound care to see if this will close or progress towards closure but also warned that possible surgery may be necessary. An x-ray that was done at [facility] showed an irregular calcaneal body and calcaneal tuberosity. This is  probably postprocedural. Previous x-rays at [facility] had suggested heterotrophic calcifications but I do not see this. Apparently a second ointment was added to the Silvadene which the patient thinks is because some improvement. She has not been systemically unwell. No fever or chills. She thinks the second ointment that was given to her at [facility] has helped somewhat. 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 ABIs done in June 2018 showed a ABI of 1.12 on the right and 1.1 on the left she was biphasic bilaterally. ABIs in our clinic were noncompressible today. 11/20/18 on evaluation today patient is seen for her second visit here in the office although this is actually the first visit with me concerning an issue that she's having with her great toe and heel of the left foot. Fortunately she does not appear to be have any discomfort at this time and she does have a scooter in order to offload her foot she also has a boot for offloading. With that being said this is something that appears to have been going on for some time she was seeing Dr. due to his recommendation was that she was going to require a below knee amputation. With that being said she wanted to come to the wound center but according to the patient Dr. Sharol Given told her that "we could not help her". Nonetheless she saw another provider who suggested that it may be worth a shot for [name] to try and help her out if it all possible. Nonetheless she decided that it would be worth a trial since otherwise any the way she's gonna end up with an amputation. Obviously if we can heal the wound then that will not be the case. Again I explained to her that  obviously there are no guarantees but we will give this a good try and attempts to get the wound to heal. 1/30; the patient continues to have areas on the left plantar heel and the left plantar great toe. The more worrisome area is  the heel. She went for MRI on Saturday but this could not be done because she had silver alginate in the wound bed. I would like to get this rebooked. If she does not have osteomyelitis she will need a total contact cast. We have been using silver alginate in the wounds 2/7; patient has wounds on her left plantar heel and left plantar great toe. The area on the heel has some depth although it looks about the same today. Her MRI will finally be done tomorrow. If she has osteomyelitis in the heel and then we will need to consider her for IV antibiotics and hyperbaric oxygen. If the MRI is negative she will need a total contact cast although she is wearing a cam boot and using a scooter at present. Will be using silver alginate. She will take it off tomorrow in preparation for the MRI 2/14; the patient's MRI showed extensive surgical changes with a large portion of her calcaneus removed on the left secondary to her previous surgery by Dr. Sharol Given however there is no evidence of osteomyelitis. She would therefore be a candidate for a total contact cast and we applied this for the first time today 2/21; silver collagen total contact cast. The area on the plantar left great toe is "healed" still a lot of callus on this area. Dimensions on the plantar heel not too much different some epithelialization is present however. This is an improvement 12/25/18 on evaluation today patient actually is seen for follow-up concerning issues that she has been having with her cast she states she feels like it's wet and squishy in the bottom of her cast. The reason she does come in to have this evaluated and see what is going on. Fortunately there's no signs of infection at this time was the cast was  removed. 3/6; the patient's area on the tip of her right great toe is callused but there is no open area here. She still has the fairly extensive area in the left heel. We have been using silver collagen in the wound. 01/08/19 on evaluation today patient actually appears to be doing very well in regard to her heel ulcer in my pinion to see if you shown signs of improvement which is excellent news. There's no evidence of infection. She continues to have quite a bit of drainage but fortunately again this doesn't seem to be hindering her healing. 3/20 -Patient's foot ulcer on the left appears to be doing well, the dimensions appear encouraging, we have been using total contact cast with Prisma and will continue doing that 3/27; patient continues to make nice progress on the left plantar heel. She is using silver collagen under a total contact cast. It is been a while since I have seen this wound and it really looks a lot better. Smaller with healthy granulation 4/3; she continues to make nice progress on the left plantar heel. Using silver collagen under a total contact cast 4/10; left plantar heel. Again skin over the surface of the wound with not much in the way of adherence. This results in undermining. Using silver collagen changed to silver alginate 4/17 left plantar heel. Again not much improvement. There is no undermining today no debridement was required we used silver alginate last time because of excessive moisture 4/24 left plantar heel. Again not as much improvement as I would have liked. About 3 mm of depth. Thick callused skin around the circumference. Using silver alginate 5/1; left plantar heel this is improved this week.  Less depth epithelialization is present. Using silver alginate alginate under a total contact cast 5/8; left plantar heel dimensions are about the same. The depth appears to be improved. I use silver collagen starting today under the cast 5/15 left plantar heel.  Arrives today with a 2-day history of feeling like something had "slipped" while walking her dog in the cast. Unfortunately extensive area relatively to the small wound of undermining superiorly denuded epithelium. 5/22-Patient returns at 1 week after being taken off the TCC on account of some fluid collection that was debrided and cultured at last visit from the plantar ulcer on the left heel. The culture results are polymicrobial with Klebsiella, enterococcus, Proteus growth these organisms are sensitive to Cipro except with enterococcus which is sensitive to ampicillin but patient is highly allergic to penicillin according to her. 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 5/29; the patient arrived last week with a new wound on her left plantar great toe. With regards to the culture that I did 2 weeks ago of her deteriorating heel wound this grew Klebsiella and enterococcus. She was given Cipro however the enterococcus would not be covered well by a quinolone. She is allergic to penicillin. I will give her linezolid 600 twice daily x5 days 6/12; the patient continues to have a wound on her left and after she returns to the beach there is undermining laterally. She has the new wound from this 2 weeks or so ago on the plantar tip of her left great toe. She had a fall today scraping the dorsal surface of the left fifth 7/14; READMISSION since the patient was last here she was hospitalized from 04/15/2019 through 04/22/2019. She had presented to an outside ER after falling and hitting her elbow. She had had previous surgery on the elbow and fractures several years ago. An x-ray was negative and she was sent home. She is readmitted with sepsis and acute renal failure secondary to septic arthritis of the elbow. Cultures apparently showed pansensitive staph aureus however she has a severe beta-lactam allergy. She was  treated with vancomycin. Apparently the vancomycin is completed and her PICC line is removed although she is seeing Dr. Megan Salon tomorrow due to continued pain and swelling. In the hospital she had an IandD by Dr. [name] of orthopedics. The original surgery was on 04/17/2019 and it was felt that she had septic arthritis. She is still having a lot of pain and swelling in the right elbow and apparently is due to see Dr. Megan Salon tomorrow and what she thinks is that she will be restarted on antibiotics. With regards to her heel wounds/left foot wounds. Her arterial studies were checked and her ABIs were within normal limits. X-ray showed plantar foot ulcer negative for osteomyelitis postop resection of the posterior calcaneus. She has been using silver alginate to the heel 8/4-Patient returns after being seen on 7/14, we are using silver alginate to the calcaneal wound, she is continuing to receive care for her right elbow septic arthritis 8/14- Patient returns after 1 week, she is being seen by the surgical group for her right elbow, she is here for the left calcaneal wound for which we are using silver alginate this is about the same 8/21; this is a patient I readmitted to the clinic 5 weeks ago. She is continuing to have difficulties with a septic arthritis of the right elbow she had after a fall  and apparently has had surgical IandD's since the last time we saw her. She is also following with Dr. Megan Salon of infectious disease and is apparently on 3 oral antibiotics although at the time of this dictation I am not sure what they are. She comes in today with a necrotic surface on the left great toe with a blister laterally. This was still clearly an open wound. She had purulent drainage coming out of this. The original wound on the plantar aspect of her right heel in the setting of a Charcot foot is deep not open to bone but certainly not any better at all. We have been using silver alginate 8/28;  dealing with septic arthritis of the right elbow. May need to go for further surgery here in the second week of September. She had a blister on the left great toe that was purulent [place] draining last week although a culture did not grow anything [already on antibiotics through infectious disease for her elbow]. The punched-out area on her heel is just like it was when she first came in. This almost closed with a total contact cast there are no options for that now. The patient states she cannot stay off her foot having to do housework X-ray of the foot showed a moderate bunion and severe degenerative changes at the first metatarsal phalangeal joint there was a moderate plantar calcaneal spur. We will need to check the location of this. 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 It is oral. We are using silver collagen to the punched-out area on her left heel and to the summer superficial area of the left great toe. She is offloading this is best she can although judging by the amount of callus it is not enough. She thinks she has enough strength in her right elbow now to use her scooter. 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 Hale Bogus notes and infectious disease. We are using silver collagen to the punched-out areas on her left heel and to the plantar aspect of her left great toe she is now using her scooter to get around and to help offload these areas 10/2; 2-week follow-up. Still on clindamycin I believe for the left elbow she is going for an MRI of the elbow over the weekend. She is then going to see orthopedics and infectious disease. The area on the left heel is certainly no  better. This does not probe to bone however there is green drainage. She has an area on the tip of her toe. The area on the heel is in a wound we almost closed at one point with total contact casting. 10/9; one-week follow-up. Culture I did of the heel last week which was a swab culture admittedly showed moderate Enterococcus faecalis moderate Pseudomonas. The wound itself looks about the same. There is no palpable bone although the depth of it closely approximates bone. The heel is swollen but not erythematous. Her MRI is booked for next week. She also has an MRI of the right elbow. I've also lifted the last consult from Dr. Megan Salon who is following her for septic arthritis of the right elbow. I  did not see him specifically comment on her left heel however the patient states that he is aware of this. I did not specifically address the organisms I cultured last week because of the possible effect of any additional cultures that will be done on the elbow. Additionally these were superficial not bone cultures 10/16; her MRI of the heel was put back to next week. She did have her elbow done. I have been in contact with Dr. Megan Salon about my concerns about osteomyelitis of the left heel. We have been using collagen. She also has a wound on the plantar tip of her first toe 10/23; her MRI of the heel was negative for osteomyelitis but suggested a small abscess. She also had an MRI of her elbow that showed findings compatible with a septic joint. She went to see Dr. Megan Salon and she is going to have both oral and IV antibiotics which I am sure will cover any infection in the heel. I did not actually see his note today. We are using silver collagen offloading the heel with a heel offloading boot 11/6; patient continues with silver collagen to both wound areas on her plantar left heel and plantar left great toe. She remains on daptomycin by Dr. Megan Salon of infectious disease. She complains of diarrhea. Dr.  Megan Salon is aware of this. 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 she has been put on oral vancomycin I think a lot of high clinical suspicion for pseudomembranous colitis Because of the right elbow there are no options to completely off her left foot beyond the heel offloading boot we have now. Fortunately the left heel ulcer itself has remained static some improvement in the plantar left great toe 11/20; patient arrives for review of her left heel ulcer. I also note she has a difficult time with septic arthritis of the right elbow. She currently is on IV daptomycin which seems to have helped the drainage in her elbow [MSSA]. Because of high concern for pseudomembranous colitis she was also put on vancomycin orally and Flagyl orally. She was on Levaquin but that was discontinued because of the concern about C. difficile. She states her diarrhea is better. She arrives in clinic today with a large area of denuded skin on the lateral part of the heel. This had totally separated. We have been using silver alginate to the wound areas. She arrived in a scooter telling me she is offloading the foot is much as possible. 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 Dr. Megan Salon stopped the daptomycin and now has her on vancomycin 1000 mg IV daily. This is supposed to go onto 1/18. She is being referred to [facility] for what sounds like a elbow replacement type operation for her MSSA septic arthritis. She arrives in clinic today with a blister on the medial part of the wound on her heel. She still has a lot of callus around the heel although the surface of the wound on the left  heel looks somewhat better. READMISSION 03/19/2020 [name] [name] is a 65 year old woman we have followed for almost all of 2020 with a neuropathic wound on her left heel and the tip of her left great toe.. We  she had previously had a partial calcanectomy by Dr. Sharol Given because of underlying osteomyelitis. She also had a history of MRSA even when we admitted her to the clinic. An MRI when she first came into our clinic did not show osteomyelitis. We put her in a total contact cast and gradually this contracted to the point it was almost closed however in that same timeframe she had a fall. 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 We had to stop putting her in a cast partially because of infection and partially because she could not support herself with the elbow. Things went progressively downhill. When we last saw her at the end of December she had a large open wound on the left heel. She had had recurrent infections in the right elbow. It was felt that either the heel lift: Ice her elbow or vice versa. We never had MRSA I do not believe cultured in the left heel however. 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 She underwent an operative debridement and a T11-L3 fusion. An operative culture grew Pseudomonas. She was treated with cefepime and required readmission to the hospital with acute kidney failure requiring temporary dialysis. She was discharged to rehab I believe she signed herself out [place]. He has been following with her primary doctor and she comes in the clinic today with miraculously the left heel totally closed. She is followed up with Dr. Megan Salon of infectious disease he does not feel she has any evidence of infection in her elbow or her heel for that matter and the heel is actually fully epithelialized. She is on ciprofloxacin 500 twice daily I think mostly directed at Pseudomonas. The  patient is convinced that it was the treatment of the Pseudomonas in her back that ultimately led to closure of her heel and that certainly possible or could have been because she was nonambulatory in the hospital for 3 months according to her. She is still using her heel offloading boot and she says she is "learning to walk again" Readmission: 06/09/2020 patient presents today for reevaluation here in the clinic following a reopening of her heel ulcer. She tells [name] that this actually occurred about 2 weeks after she was last seen in May of this year though it is now the beginning of August and she has not come back until now to see [name]. Nonetheless she does have an open wound there is been no x-rays at this time infectious disease has been monitoring him following this up until this point. They made the recommendation that she come to see [name]. Fortunately there is no signs of active infection systemically though she does tell me that she had Pseudomonas in the spine when she had her back surgery she is currently on antibiotics for that. 8/20; this is a patient who I saw 1 time in late May. Apparently shortly after she was here the area on her plantar left heel started to drain again and she had an open wound. She has been going to her primary physician and has been following with Dr. Megan Salon. 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 She is currently on 3 times a day prophylactic ciprofloxacin. She is admitted to the clinic last week for review of the wound on the left heel. Notable for the fact that  she had a partial calcanectomy by Dr. Sharol Given 3 or 4 years ago. She says she is using her motorized wheelchair again as an off loader but she came in in regular shoes. 9/2; 2-week follow-up. Plantar left heel. In spite of debridement last week she had thick very adherent callus around the circumference of the wound  and a nonviable gray surface. We have been using Hydrofera Blue. We gave her a heel offloading boot last week. She says she is off her foot except for she needs to walk her dog. 9/17; 2 week follow-up. Lives with a heel ulcer in roughly the same condition as a month ago. Though requiring debridement and revision using Hydrofera Blue. She says she is often except for times that she needs to walk her dog and she claims to be using the heel offloading shoe. She has a new injury which she says was a burn on the medial part of her fifth metacarpophalangeal on the right. 10/15; almost 1 month follow-up. She arrives with the plantar left heel ulcer and not a very good condition. She has thick callus surrounding the wound and a necrotic base over the top of this. I am not sure anybody is paying close attention to this. She says she walks minimally but I highly doubt this. She is supposed to be using Hydrofera Blue. She says she has plenty of supplies. She tells me that she is traveling to [place] next week for a 10-day trip. We will see her back after that She has both been approved for Dermagraft however I am only willing to attempt this if she is going to agree to a total contact cast. I do not think there is any point in applying this advance dressing when she is walking on this excessively READMISSION 01/10/2021 The patient was last here in October at which time we were dealing with a very difficult wound on the left plantar heel. It was not in very good condition at the time. She tells me she went to [place] on vacation. Shortly after she arrived back her husband was in an accident and then after that she had congestive heart failure secondary to her advanced chronic renal failure. I thought she told me she was admitted to hospital but I certainly do not see this at [facility] health. In any case while she was markedly fluid overloaded she developed an open area on the right lateral heel to go  along with the original wound on the left. Both of these completely covered with necrotic surface. I do not know that she is been dressing these at all. She has a heel offloading boot on the left and an ordinary shoe on the right. Before she left last time I did an MRI of the left heel that did not show distinctly osteomyelitis. She was approved for Dermagraft but the wounds are certainly not in a state for application of an advanced treatment product at this point Besides this her past medical history is largely unchanged. 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 Culture at that point showed Pseudomonas. She is not currently on antibiotics. She was on suppressive ciprofloxacin for the spine osteomyelitis but she says this  was stopped by Dr. Megan Salon. Her primary doctor recently gave her a course of doxycycline but she could not tolerate it because of nausea and a rash ABIs in our clinic were 1.13 on the right and 1.15 on the left Electronic Signature(s) Signed: 01/10/2021 5:58:13 PM By: Linton Ham MD Entered By: Linton Ham on 01/10/2021 17:30:59 -------------------------------------------------------------------------------- Physical Exam Details Patient Name: Date of Service: [name] [name], [name] [name]. 01/10/2021 2:45 PM Medical Record Number: [id number] Patient Account Number: [id number] Date of Birth/Sex: Treating RN: 1956-04-30 (65 y.o. F) Primary Care Provider: [name] Other Clinician: Referring Provider: Treating Provider/Extender: [name] in Treatment: 0 Constitutional Patient is hypertensive.. Pulse regular and within target range for  patient.[name] Kitchen Respirations regular, non-labored and within target range.. Temperature is normal and within the target range for the patient.[name] Kitchen Appears in no distress. Respiratory work of breathing is normal. Cardiovascular Pedal pulses are easily palpable bilaterally. Notes Wound exam; the patient has her original wound on the plantar left heel. This does not look strangely enough too much different from the last time I saw this. Completely necrotic circumference nonviable wound surface. This is deep down to the muscle layer of her heel but does not probe to bone. I debrided the callus skin and subcutaneous tissue from the wound margin as well as necrotic debris on the wound surface hemostasis with silver nitrate and a pressure dressing On the right lateral heel a very similar looking wound again completely necrotic surface tissue which underwent an aggressive debridement with pickups and a #10 scalpel. Hemostasis with silver nitrate and a pressure dressing. Unfortunately this area does probe to bone in the center part of the wound Electronic Signature(s) Signed: 01/10/2021 5:58:13 PM By: Linton Ham MD Signed: 01/10/2021 5:58:13 PM By: Linton Ham MD Entered By: Linton Ham on 01/10/2021 17:33:13 -------------------------------------------------------------------------------- Physician Orders Details Patient Name: Date of Service: [name] [name], [name] [name]. 01/10/2021 2:45 PM Medical Record Number: [id number] Patient Account Number: [id number] Date of Birth/Sex: Treating RN: [birth year] (64 y.o. F) Primary Care Provider: [name] Other Clinician: Referring Provider: Treating Provider/Extender: [name] in Treatment: 0 Verbal / Phone Orders: No Diagnosis Coding Follow-up Appointments Return Appointment in 1 week. Bathing/ Shower/ Hygiene May shower and wash wound with soap and water. Off-Loading Open toe surgical shoe to: - Right Foot Heel  suspension boot to: - Gloped Heel Offloader to left foot Additional Orders / Instructions Follow Nutritious Diet Wound Treatment Wound #10 - Foot Wound Laterality: Right, Lateral Cleanser: Soap and Water 1 x Per Day/15 Days Discharge Instructions: May shower and wash wound with dial antibacterial soap and water prior to dressing change. Prim Dressing: KerraCel Ag Gelling Fiber Dressing, 4x5 in (silver alginate) 1 x Per Day/15 Days ary Discharge Instructions: Apply silver alginate to wound bed as instructed Secondary Dressing: Woven Gauze Sponge, Non-Sterile 4x4 in 1 x Per Day/15 Days Discharge Instructions: Apply over primary dressing as directed. Secondary Dressing: ABD Pad, 5x9 1 x Per Day/15 Days Discharge Instructions: Apply over primary dressing as directed. Secured With: [employer], 4.5x3.1 (in/yd) 1 x Per Day/15 Days Discharge Instructions: Secure with Kerlix as directed. Secured With: Transpore Surgical Tape, 2x10 (in/yd) 1 x Per Day/15 Days Discharge Instructions: Secure dressing with tape as directed. Wound #9 - Calcaneus Wound Laterality: Left Cleanser: Soap and Water 1 x Per SNK/53 Days Discharge Instructions: May shower and wash wound with dial antibacterial soap and water prior to dressing change. Prim Dressing: Paulina Fusi  Ag Gelling Fiber Dressing, 4x5 in (silver alginate) 1 x Per Day/15 Days ary Discharge Instructions: Apply silver alginate to wound bed as instructed Secondary Dressing: Woven Gauze Sponge, Non-Sterile 4x4 in 1 x Per Day/15 Days Discharge Instructions: Apply over primary dressing as directed. Secondary Dressing: ABD Pad, 5x9 1 x Per Day/15 Days Discharge Instructions: Apply over primary dressing as directed. Secured With: [employer], 4.5x3.1 (in/yd) 1 x Per Day/15 Days Discharge Instructions: Secure with Kerlix as directed. Secured With: Transpore Surgical Tape, 2x10 (in/yd) 1 x Per Day/15 Days Discharge Instructions: Secure dressing with  tape as directed. Radiology X-ray, foot, Left and Right Foot - Non-healing bilateral foot wounds ICD E11.621 Electronic Signature(s) Signed: 01/10/2021 5:53:54 PM By: Lorrin Jackson Signed: 01/10/2021 5:58:13 PM By: Linton Ham MD Previous Signature: 01/10/2021 2:56:03 PM Version By: Lorrin Jackson Entered By: Lorrin Jackson on 01/10/2021 16:20:09 Prescription 01/10/2021 -------------------------------------------------------------------------------- Osvaldo Shipper MD Patient Name: Provider: [birth year] [id number] Date of Birth: NPI#Wanda Plump [id number] Sex: DEA #: [id number] Phone #: License #: [place] Patient Address: [address] [place], [place] [address] [place], [place] [address] [phone] Allergies mushroom; penicillin; rosemary; Shellfish Containing Products; bee venom protein (honey bee); Fish Containing Products; tomato, raw; aloe vera; acetaminophen; acyclovir; naproxen; doxycycline Provider's Orders X-ray, foot, Left and Right Foot - Non-healing bilateral foot wounds ICD E11.621 Hand Signature: Date(s): Electronic Signature(s) Signed: 01/10/2021 5:53:54 PM By: Lorrin Jackson Signed: 01/10/2021 5:58:13 PM By: Linton Ham MD Entered By: Lorrin Jackson on 01/10/2021 16:20:09 -------------------------------------------------------------------------------- Problem List Details Patient Name: Date of Service: [name] [name], [name] [name]. 01/10/2021 2:45 PM Medical Record Number: [id number] Patient Account Number: [id number] Date of Birth/Sex: Treating RN: 05-Oct-1956 (65 y.o. 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 01/10/2021 2:45 PM Medical Record Number: [id number] Patient Account Number: [id number] Date of Birth/Sex: Treating RN: [birth year] (65 y.o. F) Primary Care Provider: [name] Other Clinician: Referring Provider: Treating Provider/Extender: [name] in Treatment: 0 Subjective Chief Complaint Information obtained from Patient Left heel ulcer 01/10/2021; patient returns to clinic with 2 large wounds on her bilateral heels History of Present Illness (HPI) ADMISSION 11/12/2018 This is a 65 year old woman with type 2 diabetes and diabetic neuropathy. She has been dealing with a left heel plantar wound for roughly 2 years. She states this started when she pulled some skin off the area and it progressed into a wound. She also has an area on the tip of her left great toe for 1 year. She has been largely followed by Dr. Sharol Given and she had an excision of bone in the left heel in 2017. I do not see microbiology from this excision or pathology. Apparently  this wound never really closed. She most recently has been using Silvadene cream. Offloading this with a scooter. The last MRI I see was in June 2018 which did not show osteomyelitis at that time. Apparently this wound is never really progressed towards  healing. During her last review by Dr. Sharol Given in October it was recommended that she undergo a left BKA and she refused. She went to see a second orthopedic consult at [facility] who am recommended conservative wound care to see if this will close or progress towards closure but also warned that possible surgery may be necessary. An x-ray that was done at [facility] showed an irregular calcaneal body and calcaneal tuberosity. This is probably postprocedural. Previous x-rays at [facility] had suggested heterotrophic calcifications but I do not see this. Apparently a second ointment was added to the Silvadene which the patient thinks is because some improvement. She has not been systemically unwell. No fever or chills. She thinks the second ointment that was given to her at [facility] has helped somewhat. 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 ABIs done in June 2018 showed a ABI of 1.12 on the right and 1.1 on the left she was biphasic bilaterally. ABIs in our clinic were noncompressible today. 11/20/18 on evaluation today patient is seen for her second visit here in the office although this is actually the first visit with me concerning an issue that she's having with her great toe and heel of the left foot. Fortunately she does not appear to be have any discomfort at this time and she does have a scooter in order to offload her foot she also has a boot for offloading. With that being said this is something that appears to have been going on for some time she was seeing Dr. due to his recommendation was that she was going to require a below knee amputation. With that being said she wanted to come to the wound  center but according to the patient Dr. Sharol Given told her that "we could not help her". Nonetheless she saw another provider who suggested that it may be worth a shot for [name] to try and help her out if it all possible. Nonetheless she decided that it would be worth a trial since otherwise any the way she's gonna end up with an amputation. Obviously if we can heal the wound then that will not be the case. Again I explained to her that obviously there are no guarantees but we will give this a good try and attempts to get the wound to heal. 1/30; the patient continues to have areas on the left plantar heel and the left plantar great toe. The more worrisome area is the heel. She went for MRI on Saturday but this could not be done because she had silver alginate in the wound bed. I would like to get this rebooked. If she does not have osteomyelitis she will need a total contact cast. We have been using silver alginate in the wounds 2/7; patient has wounds on her left plantar heel and left plantar great toe. The area on the heel has some depth although it looks about the same today. Her MRI will finally be done tomorrow.  If she has osteomyelitis in the heel and then we will need to consider her for IV antibiotics and hyperbaric oxygen. If the MRI is negative she will need a total contact cast although she is wearing a cam boot and using a scooter at present. Will be using silver alginate. She will take it off tomorrow in preparation for the MRI 2/14; the patient's MRI showed extensive surgical changes with a large portion of her calcaneus removed on the left secondary to her previous surgery by Dr. Sharol Given however there is no evidence of osteomyelitis. She would therefore be a candidate for a total contact cast and we applied this for the first time today 2/21; silver collagen total contact cast. The area on the plantar left great toe is "healed" still a lot of callus on this area. Dimensions on the plantar heel not  too much different some epithelialization is present however. This is an improvement 12/25/18 on evaluation today patient actually is seen for follow-up concerning issues that she has been having with her cast she states she feels like it's wet and squishy in the bottom of her cast. The reason she does come in to have this evaluated and see what is going on. Fortunately there's no signs of infection at this time was the cast was removed. 3/6; the patient's area on the tip of her right great toe is callused but there is no open area here. She still has the fairly extensive area in the left heel. We have been using silver collagen in the wound. 01/08/19 on evaluation today patient actually appears to be doing very well in regard to her heel ulcer in my pinion to see if you shown signs of improvement which is excellent news. There's no evidence of infection. She continues to have quite a bit of drainage but fortunately again this doesn't seem to be hindering her healing. 3/20 -Patient's foot ulcer on the left appears to be doing well, the dimensions appear encouraging, we have been using total contact cast with Prisma and will continue doing that 3/27; patient continues to make nice progress on the left plantar heel. She is using silver collagen under a total contact cast. It is been a while since I have seen this wound and it really looks a lot better. Smaller with healthy granulation 4/3; she continues to make nice progress on the left plantar heel. Using silver collagen under a total contact cast 4/10; left plantar heel. Again skin over the surface of the wound with not much in the way of adherence. This results in undermining. Using silver collagen changed to silver alginate 4/17 left plantar heel. Again not much improvement. There is no undermining today no debridement was required we used silver alginate last time because of excessive moisture 4/24 left plantar heel. Again not as much improvement  as I would have liked. About 3 mm of depth. Thick callused skin around the circumference. Using silver alginate 5/1; left plantar heel this is improved this week. Less depth epithelialization is present. Using silver alginate alginate under a total contact cast 5/8; left plantar heel dimensions are about the same. The depth appears to be improved. I use silver collagen starting today under the cast 5/15 left plantar heel. Arrives today with a 2-day history of feeling like something had "slipped" while walking her dog in the cast. Unfortunately extensive area relatively to the small wound of undermining superiorly denuded epithelium. 5/22-Patient returns at 1 week after being taken off the TCC on account  of some fluid collection that was debrided and cultured at last visit from the plantar ulcer on the left heel. The culture results are polymicrobial with Klebsiella, enterococcus, Proteus growth these organisms are sensitive to Cipro except with enterococcus which is sensitive to ampicillin but patient is highly allergic to penicillin according to her. 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 5/29; the patient arrived last week with a new wound on her left plantar great toe. With regards to the culture that I did 2 weeks ago of her deteriorating heel wound this grew Klebsiella and enterococcus. She was given Cipro however the enterococcus would not be covered well by a quinolone. She is allergic to penicillin. I will give her linezolid 600 twice daily x5 days 6/12; the patient continues to have a wound on her left and after she returns to the beach there is undermining laterally. She has the new wound from this 2 weeks or so ago on the plantar tip of her left great toe. She had a fall today scraping the dorsal surface of the left fifth 7/14; READMISSION since the patient was last here she was hospitalized from  04/15/2019 through 04/22/2019. She had presented to an outside ER after falling and hitting her elbow. She had had previous surgery on the elbow and fractures several years ago. An x-ray was negative and she was sent home. She is readmitted with sepsis and acute renal failure secondary to septic arthritis of the elbow. Cultures apparently showed pansensitive staph aureus however she has a severe beta-lactam allergy. She was treated with vancomycin. Apparently the vancomycin is completed and her PICC line is removed although she is seeing Dr. Megan Salon tomorrow due to continued pain and swelling. In the hospital she had an IandD by Dr. [name] of orthopedics. The original surgery was on 04/17/2019 and it was felt that she had septic arthritis. She is still having a lot of pain and swelling in the right elbow and apparently is due to see Dr. Megan Salon tomorrow and what she thinks is that she will be restarted on antibiotics. With regards to her heel wounds/left foot wounds. Her arterial studies were checked and her ABIs were within normal limits. X-ray showed plantar foot ulcer negative for osteomyelitis postop resection of the posterior calcaneus. She has been using silver alginate to the heel 8/4-Patient returns after being seen on 7/14, we are using silver alginate to the calcaneal wound, she is continuing to receive care for her right elbow septic arthritis 8/14- Patient returns after 1 week, she is being seen by the surgical group for her right elbow, she is here for the left calcaneal wound for which we are using silver alginate this is about the same 8/21; this is a patient I readmitted to the clinic 5 weeks ago. She is continuing to have difficulties with a septic arthritis of the right elbow she had after a fall and apparently has had surgical IandD's since the last time we saw her. She is also following with Dr. Megan Salon of infectious disease and is apparently on 3 oral antibiotics although at the  time of this dictation I am not sure what they are. She comes in today with a necrotic surface on the left great toe with a blister laterally. This was still clearly an open wound. She had purulent drainage coming out of this. The original wound on the plantar aspect of her right heel  in the setting of a Charcot foot is deep not open to bone but certainly not any better at all. We have been using silver alginate 8/28; dealing with septic arthritis of the right elbow. May need to go for further surgery here in the second week of September. She had a blister on the left great toe that was purulent [place] draining last week although a culture did not grow anything [already on antibiotics through infectious disease for her elbow]. The punched-out area on her heel is just like it was when she first came in. This almost closed with a total contact cast there are no options for that now. The patient states she cannot stay off her foot having to do housework X-ray of the foot showed a moderate bunion and severe degenerative changes at the first metatarsal phalangeal joint there was a moderate plantar calcaneal spur. We will need to check the location of this. 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 It is oral. We are using silver collagen to the punched-out area on her left heel and to the summer superficial area of the left great toe. She is offloading this is best she can although judging by the amount of callus it is not enough. She thinks she has enough strength in her right elbow now to use her scooter. 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 Hale Bogus notes and infectious disease. We  are using silver collagen to the punched-out areas on her left heel and to the plantar aspect of her left great toe she is now using her scooter to get around and to help offload these areas 10/2; 2-week follow-up. Still on clindamycin I believe for the left elbow she is going for an MRI of the elbow over the weekend. She is then going to see orthopedics and infectious disease. The area on the left heel is certainly no better. This does not probe to bone however there is green drainage. She has an area on the tip of her toe. The area on the heel is in a wound we almost closed at one point with total contact casting. 10/9; one-week follow-up. Culture I did of the heel last week which was a swab culture admittedly showed moderate Enterococcus faecalis moderate Pseudomonas. The wound itself looks about the same. There is no palpable bone although the depth of it closely approximates bone. The heel is swollen but not erythematous. Her MRI is booked for next week. She also has an MRI of the right elbow. I've also lifted the last consult from Dr. Megan Salon who is following her for septic arthritis of the right elbow. I did not see him specifically comment on her left heel however the patient states that he is aware of this. I did not specifically address the organisms I cultured last week because of the possible effect of any additional cultures that will be done on the elbow. Additionally these were superficial not bone cultures 10/16; her MRI of the heel was put back to next week. She did have her elbow done. I have been in contact with Dr. Megan Salon about my concerns about osteomyelitis of the left  heel. We have been using collagen. She also has a wound on the plantar tip of her first toe 10/23; her MRI of the heel was negative for osteomyelitis but suggested a small abscess. She also had an MRI of her elbow that showed findings compatible with a septic joint. She went to see Dr. Megan Salon and she is going  to have both oral and IV antibiotics which I am sure will cover any infection in the heel. I did not actually see his note today. We are using silver collagen offloading the heel with a heel offloading boot 11/6; patient continues with silver collagen to both wound areas on her plantar left heel and plantar left great toe. She remains on daptomycin by Dr. Megan Salon of infectious disease. She complains of diarrhea. Dr. Megan Salon is aware of this. 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 she has been put on oral vancomycin I think a lot of high clinical suspicion for pseudomembranous colitis Because of the right elbow there are no options to completely off her left foot beyond the heel offloading boot we have now. Fortunately the left heel ulcer itself has remained static some improvement in the plantar left great toe 11/20; patient arrives for review of her left heel ulcer. I also note she has a difficult time with septic arthritis of the right elbow. She currently is on IV daptomycin which seems to have helped the drainage in her elbow [MSSA]. Because of high concern for pseudomembranous colitis she was also put on vancomycin orally and Flagyl orally. She was on Levaquin but that was discontinued because of the concern about C. difficile. She states her diarrhea is better. She arrives in clinic today with a large area of denuded skin on the lateral part of the heel. This had totally separated. We have been using silver alginate to the wound areas. She arrived in a scooter telling me she is offloading the foot is much as possible. 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 Dr.  Megan Salon stopped the daptomycin and now has her on vancomycin 1000 mg IV daily. This is supposed to go onto 1/18. She is being referred to [facility] for what sounds like a elbow replacement type operation for her MSSA septic arthritis. She arrives in clinic today with a blister on the medial part of the wound on her heel. She still has a lot of callus around the heel although the surface of the wound on the left heel looks somewhat better. READMISSION 03/19/2020 Mrs. Havel is a 65 year old woman we have followed for almost all of 2020 with a neuropathic wound on her left heel and the tip of her left great toe.. We she had previously had a partial calcanectomy by Dr. Sharol Given because of underlying osteomyelitis. She also had a history of MRSA even when we admitted her to the clinic. An MRI when she first came into our clinic did not show osteomyelitis. We put her in a total contact cast and gradually this contracted to the point it was almost closed however in that same timeframe she had a fall. 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 We had to stop putting her in  a cast partially because of infection and partially because she could not support herself with the elbow. Things went progressively downhill. When we last saw her at the end of December she had a large open wound on the left heel. She had had recurrent infections in the right elbow. It was felt that either the heel lift: Ice her elbow or vice versa. We never had MRSA I do not believe cultured in the left heel however. 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 She underwent an operative debridement and a T11-L3 fusion. An operative culture grew Pseudomonas. She was treated with cefepime and required readmission to the hospital with acute kidney failure requiring temporary dialysis. She was discharged to rehab I believe she signed herself out [place]. He has been following with her primary doctor and she comes in the clinic today with miraculously the left heel totally closed. She is followed up with Dr. Megan Salon of infectious disease he does not feel she has any evidence of infection in her elbow or her heel for that matter and the heel is actually fully epithelialized. She is on ciprofloxacin 500 twice daily I think mostly directed at Pseudomonas. The patient is convinced that it was the treatment of the Pseudomonas in her back that ultimately led to closure of her heel and that certainly possible or could have been because she was nonambulatory in the hospital for 3 months according to her. She is still using her heel offloading boot and she says she is "learning to walk again" Readmission: 06/09/2020 patient presents today for reevaluation here in the clinic following a reopening of her heel ulcer. She tells [name] that this actually occurred about 2 weeks after she was last seen in May of this year though it is now the beginning of August and she has not come back until now to see [name]. Nonetheless she does have an open wound there is been no x-rays at this time infectious disease has been monitoring him following this up until this point. They made the recommendation that she come to see [name]. Fortunately there is no signs of active infection systemically though she does tell me that she had Pseudomonas in the spine when she had her back surgery she is currently on antibiotics for that. 8/20; this is a patient who I saw 1 time in late May. Apparently shortly after she was here the area on her plantar left heel started to drain again and she had an open wound. She has been going to her primary physician and has been following with Dr. Megan Salon. 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 She is currently on 3 times a day prophylactic ciprofloxacin. She  is admitted to the clinic last week for review of the wound on the left heel. Notable for the fact that she had a partial calcanectomy by Dr. Sharol Given 3 or 4 years ago. She says she is using her motorized wheelchair again as an off loader but she came in in regular shoes. 9/2; 2-week follow-up. Plantar left heel. In spite of debridement last week she had thick very adherent callus around the circumference of the wound and a nonviable gray surface. We have been using Hydrofera Blue. We gave her a heel offloading boot last week. She says she is off her foot except for she  needs to walk her dog. 9/17; 2 week follow-up. Lives with a heel ulcer in roughly the same condition as a month ago. Though requiring debridement and revision using Hydrofera Blue. She says she is often except for times that she needs to walk her dog and she claims to be using the heel offloading shoe. She has a new injury which she says was a burn on the medial part of her fifth metacarpophalangeal on the right. 10/15; almost 1 month follow-up. She arrives with the plantar left heel ulcer and not a very good condition. She has thick callus surrounding the wound and a necrotic base over the top of this. I am not sure anybody is paying close attention to this. She says she walks minimally but I highly doubt this. She is supposed to be using Hydrofera Blue. She says she has plenty of supplies. She tells me that she is traveling to [place] next week for a 10-day trip. We will see her back after that She has both been approved for Dermagraft however I am only willing to attempt this if she is going to agree to a total contact cast. I do not think there is any point in applying this advance dressing when she is walking on this excessively READMISSION 01/10/2021 The patient was last here in October at which time we were dealing with a very difficult wound on the left plantar heel. It was not in very good condition at the time. She  tells me she went to [place] on vacation. Shortly after she arrived back her husband was in an accident and then after that she had congestive heart failure secondary to her advanced chronic renal failure. I thought she told me she was admitted to hospital but I certainly do not see this at [facility] health. In any case while she was markedly fluid overloaded she developed an open area on the right lateral heel to go along with the original wound on the left. Both of these completely covered with necrotic surface. I do not know that she is been dressing these at all. She has a heel offloading boot on the left and an ordinary shoe on the right. Before she left last time I did an MRI of the left heel that did not show distinctly osteomyelitis. She was approved for Dermagraft but the wounds are certainly not in a state for application of an advanced treatment product at this point Besides this her past medical history is largely unchanged. 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 Culture at that point showed Pseudomonas. She is not currently on antibiotics. She was on suppressive ciprofloxacin for the spine osteomyelitis but she says this was stopped by Dr. Megan Salon. Her primary doctor recently gave her a course of doxycycline but she could not tolerate it because of nausea and a rash ABIs in our clinic were 1.13 on the right and 1.15 on the left Patient History Information obtained from Patient. Allergies Fish Containing Products (Reaction: hives, swelling), mushroom (Severity: Severe, Reaction: anaphylaxis), penicillin (Severity: Severe, Reaction: throat swelling), tomato, raw (Reaction: hives),  rosemary (Severity: Severe, Reaction: anaphylaxis), Shellfish Containing Products (Severity: Severe, Reaction: hives, swelling), aloe vera (Reaction: rash), bee  venom protein (honey bee) (Severity: Moderate, Reaction: hives, swelling), acetaminophen (Reaction: GI upset), acyclovir (Reaction: unknown), naproxen (Reaction: unknown), doxycycline Family History Diabetes - Maternal Grandparents,Paternal Grandparents,Father,Siblings, Heart Disease - Mother,Father,Siblings, Hypertension - Father, No family history of Cancer, Hereditary Spherocytosis, Kidney Disease, Lung Disease, Seizures, Stroke, Thyroid Problems, Tuberculosis. Social History Never smoker, Marital Status - Married, Alcohol Use - Never, Drug Use - No History, Caffeine Use - Moderate - tea. Medical History Eyes Patient has history of Cataracts - left eye removed, mild right eye Denies history of Glaucoma, Optic Neuritis Ear/Nose/Mouth/Throat Denies history of Chronic sinus problems/congestion, Middle ear problems Hematologic/Lymphatic Patient has history of Anemia Denies history of Hemophilia, Human Immunodeficiency Virus, Lymphedema, Sickle Cell Disease Respiratory Patient has history of Sleep Apnea Denies history of Aspiration, Asthma, Chronic Obstructive Pulmonary Disease (COPD), Pneumothorax, Tuberculosis Cardiovascular Patient has history of Coronary Artery Disease, Hypertension, Myocardial Infarction - 2014 Gastrointestinal Denies history of Cirrhosis , Colitis, Crohnoos, Hepatitis A, Hepatitis B, Hepatitis C Endocrine Patient has history of Type II Diabetes Denies history of Type I Diabetes Immunological Denies history of Lupus Erythematosus, Raynaudoos, Scleroderma Integumentary (Skin) Denies history of History of Burn Musculoskeletal Patient has history of Osteoarthritis, Osteomyelitis - left calcaneus and spine Denies history of Gout, Rheumatoid Arthritis Neurologic Patient has history of Neuropathy Denies  history of Dementia, Quadriplegia, Paraplegia, Seizure Disorder Oncologic Denies history of Received Chemotherapy, Received Radiation Psychiatric Denies history of Anorexia/bulimia, Confinement Anxiety Hospitalization/Surgery History - left heel surgery. - hysterectomy. - cholecystectomy. - right elbow surgery x4. - December 2021. Medical A Surgical History Notes nd Constitutional Symptoms (General Health) morbid obesity, fibromyalgia Eyes macular degeneration left eye Ear/Nose/Mouth/Throat seasonal allergies Cardiovascular hyperlipidemia Gastrointestinal fatty liver Genitourinary CKD stage 3 Neurologic cervical ruptured disc Review of Systems (ROS) Constitutional Symptoms (General Health) Denies complaints or symptoms of Fatigue, Fever, Chills, Marked Weight Change. Eyes Denies complaints or symptoms of Dry Eyes, Vision Changes, Glasses / Contacts. Ear/Nose/Mouth/Throat Denies complaints or symptoms of Chronic sinus problems or rhinitis. Respiratory Denies complaints or symptoms of Chronic or frequent coughs, Shortness of Breath. Cardiovascular Denies complaints or symptoms of Chest pain. Gastrointestinal Denies complaints or symptoms of Frequent diarrhea, Nausea, Vomiting. Endocrine Denies complaints or symptoms of Heat/cold intolerance. Genitourinary Denies complaints or symptoms of Frequent urination. Integumentary (Skin) Denies complaints or symptoms of Wounds. Musculoskeletal Denies complaints or symptoms of Muscle Pain, Muscle Weakness. Neurologic Denies complaints or symptoms of Numbness/parasthesias. Psychiatric Denies complaints or symptoms of Claustrophobia, Suicidal. Objective Constitutional Patient is hypertensive.. Pulse regular and within target range for patient.[name] Kitchen Respirations regular, non-labored and within target range.. Temperature is normal and within the target range for the patient.[name] Kitchen Appears in no distress. Vitals Time Taken: 2:46 PM, Height:  61 in, Source: Stated, Weight: 162 lbs, Source: Stated, BMI: 30.6, Temperature: 98.4 F, Pulse: 74 bpm, Respiratory Rate: 17 breaths/min, Blood Pressure: 160/84 mmHg, Capillary Blood Glucose: 117 mg/dl. Respiratory work of breathing is normal. Cardiovascular Pedal pulses are easily palpable bilaterally. General Notes: Wound exam; the patient has her original wound on the plantar left heel. This does not look strangely enough too much different from the last time I saw this. Completely necrotic circumference nonviable wound surface. This is deep down to the muscle layer of her heel but does not probe to bone. I debrided the callus skin and subcutaneous tissue from the wound margin as well as necrotic debris on the wound surface hemostasis with silver nitrate and a pressure dressing ooOn the right lateral heel a very similar looking wound again completely  necrotic surface tissue which underwent an aggressive debridement with pickups and a #10 scalpel. Hemostasis with silver nitrate and a pressure dressing. Unfortunately this area does probe to bone in the center part of the wound Integumentary (Hair, Skin) Wound #10 status is Open. Original cause of wound was Gradually Appeared. The date acquired was: 09/29/2020. The wound is located on the Right,Lateral Foot. The wound measures 3cm length x 4.5cm width x 1.4cm depth; 10.603cm^2 area and 14.844cm^3 volume. There is Fat Layer (Subcutaneous Tissue) exposed. There is no tunneling noted, however, there is undermining starting at 12:00 and ending at 12:00 with a maximum distance of 0.4cm. There is a medium amount of serosanguineous drainage noted. The wound margin is distinct with the outline attached to the wound base. There is small (1-33%) red, pink granulation within the wound bed. There is a large (67-100%) amount of necrotic tissue within the wound bed including Eschar and Adherent Slough. Wound #9 status is Open. Original cause of wound was  Gradually Appeared. The date acquired was: 01/11/2019. The wound is located on the Left Calcaneus. The wound measures 4cm length x 3.7cm width x 2.2cm depth; 11.624cm^2 area and 25.573cm^3 volume. There is no tunneling noted, however, there is undermining starting at 12:00 and ending at 4:00 with a maximum distance of 1.7cm. There is a medium amount of serosanguineous drainage noted. The wound margin is distinct with the outline attached to the wound base. There is medium (34-66%) red, pink granulation within the wound bed. There is a medium (34-66%) amount of necrotic tissue within the wound bed including Eschar and Adherent Slough. 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 There was a Excisional Skin/Subcutaneous Tissue Debridement with a total area of 13.5 sq cm performed by Ricard Dillon., MD. With the following instrument(s): Blade, Forceps, and Scissors Material removed includes Eschar, Subcutaneous Tissue, and Skin: Dermis after achieving pain control using Other (Benzocaine). No specimens were taken. A time out was conducted at 16:00, prior to the start of the procedure. A Moderate amount of bleeding was controlled with Silver Nitrate. The procedure was tolerated well. Post Debridement Measurements: 3cm length x 4.5cm width x 1.4cm depth; 14.844cm^3 volume. Character of Wound/Ulcer Post Debridement is stable. Severity of Tissue Post Debridement is: Muscle involvement without necrosis. Post procedure Diagnosis Wound #10: Same as  Pre-Procedure Wound #9 Pre-procedure diagnosis of Wound #9 is a Diabetic Wound/Ulcer of the Lower Extremity located on the Left Calcaneus .Severity of Tissue Pre Debridement is: Fat layer exposed. There was a Excisional Skin/Subcutaneous Tissue/Muscle Debridement with a total area of 14.8 sq cm performed by Ricard Dillon., MD. With the following instrument(s): Blade, and Curette Material removed includes Muscle and Subcutaneous Tissue and after achieving pain control using Other (Benzocaine). No specimens were taken. A time out was conducted at 16:00, prior to the start of the procedure. A Moderate amount of bleeding was controlled with Silver Nitrate. The procedure was tolerated well. Post Debridement Measurements: 4cm length x 3.7cm width x 2.2cm depth; 25.573cm^3 volume. Character of Wound/Ulcer Post Debridement is stable. Severity of Tissue Post Debridement is: Fat layer exposed.  Post procedure Diagnosis Wound #9: Same as Pre-Procedure Plan Follow-up Appointments: Return Appointment in 1 week. Bathing/ Shower/ Hygiene: May shower and wash wound with soap and water. Off-Loading: Open toe surgical shoe to: - Right Foot Heel suspension boot to: - Gloped Heel Offloader to left foot Additional Orders / Instructions: Follow Nutritious Diet Radiology ordered were: X-ray, foot, Left and Right Foot - Non-healing bilateral foot wounds ICD E11.621 WOUND #10: - Foot Wound Laterality: Right, Lateral Cleanser: Soap and Water 1 x Per Day/15 Days Discharge Instructions: May shower and wash wound with dial antibacterial soap and water prior to dressing change. Prim Dressing: KerraCel Ag Gelling Fiber Dressing, 4x5 in (silver alginate) 1 x Per Day/15 Days ary Discharge Instructions: Apply silver alginate to wound bed as instructed Secondary Dressing: Woven Gauze Sponge, Non-Sterile 4x4 in 1 x Per Day/15 Days Discharge Instructions: Apply over primary dressing as directed. Secondary Dressing: ABD  Pad, 5x9 1 x Per Day/15 Days Discharge Instructions: Apply over primary dressing as directed. Secured With: [employer], 4.5x3.1 (in/yd) 1 x Per Day/15 Days Discharge Instructions: Secure with Kerlix as directed. Secured With: [employer] T ape, 2x10 (in/yd) 1 x Per Day/15 Days Discharge Instructions: Secure dressing with tape as directed. WOUND #9: - Calcaneus Wound Laterality: Left Cleanser: Soap and Water 1 x Per IWL/79 Days Discharge Instructions: May shower and wash wound with dial antibacterial soap and water prior to dressing change. Prim Dressing: KerraCel Ag Gelling Fiber Dressing, 4x5 in (silver alginate) 1 x Per Day/15 Days ary Discharge Instructions: Apply silver alginate to wound bed as instructed Secondary Dressing: Woven Gauze Sponge, Non-Sterile 4x4 in 1 x Per Day/15 Days Discharge Instructions: Apply over primary dressing as directed. Secondary Dressing: ABD Pad, 5x9 1 x Per Day/15 Days Discharge Instructions: Apply over primary dressing as directed. Secured With: [employer], 4.5x3.1 (in/yd) 1 x Per Day/15 Days Discharge Instructions: Secure with Kerlix as directed. Secured With: [employer] T ape, 2x10 (in/yd) 1 x Per Day/15 Days Discharge Instructions: Secure dressing with tape as directed. 1. Aggressive debridement bilaterally we are going to use silver alginate change daily 2. The likelihood of underlying osteomyelitis in 1 or both of these areas is high I am going to start with plain x-rays of both heels 3. My sense of things is that this patient has been noncompliant with offloading and also wound center follow-up. I have told her that she is at high risk of amputation if infection spreads out of these areas. During the original septic episode I believe she had MRSA but it was never clear whether this came from her heel or the septic arthritis and osteomyelitis in her right elbow. #4 if the plain x-ray does not show osteomyelitis she is  likely to require bilateral MRIs 5. I have warned her to stay off these areas. Her compliance with this instruction in the past has been less than ideal. 6. I did not do any cultures of either area I would like to see the imaging first 7. Of note I think the patient told me that she was admitted to hospital with congestive heart failure secondary to chronic renal failure and had 17 pounds of diuresis although I do not see a note or a discharge summary. I may have been missed took what she was saying. I spent 45 minutes in review of this patient's past medical history, face-to-face evaluation and preparation of this record Electronic Signature(s) Signed: 01/10/2021 5:58:13 PM By: Linton Ham MD Entered By: Dellia Nims,  Marne Meline on 01/10/2021 17:37:42 -------------------------------------------------------------------------------- HxROS Details Patient Name: Date of Service: [name] [name] [name], [place] NNA [name]. 01/10/2021 2:45 PM Medical Record Number: [id number] Patient Account Number: [id number] Date of Birth/Sex: Treating RN: [birth year] (65 y.o. [name], [name] Primary Care Provider: [name] Other Clinician: Referring Provider: Treating Provider/Extender: [name] in Treatment: 0 Information Obtained From Patient Constitutional Symptoms (General Health) Complaints and Symptoms: Negative for: Fatigue; Fever; Chills; Marked Weight Change Medical History: Past Medical History Notes: morbid obesity, fibromyalgia Eyes Complaints and Symptoms: Negative for: Dry Eyes; Vision Changes; Glasses / Contacts Medical History: Positive for: Cataracts - left eye removed, mild right eye Negative for: Glaucoma; Optic Neuritis Past Medical History Notes: macular degeneration left eye Ear/Nose/Mouth/Throat Complaints and Symptoms: Negative for: Chronic sinus problems or rhinitis Medical History: Negative for: Chronic sinus problems/congestion; Middle ear  problems Past Medical History Notes: seasonal allergies Respiratory Complaints and Symptoms: Negative for: Chronic or frequent coughs; Shortness of Breath Medical History: Positive for: Sleep Apnea Negative for: Aspiration; Asthma; Chronic Obstructive Pulmonary Disease (COPD); Pneumothorax; Tuberculosis Cardiovascular Complaints and Symptoms: Negative for: Chest pain Medical History: Positive for: Coronary Artery Disease; Hypertension; Myocardial Infarction - 2014 Past Medical History Notes: hyperlipidemia Gastrointestinal Complaints and Symptoms: Negative for: Frequent diarrhea; Nausea; Vomiting Medical History: Negative for: Cirrhosis ; Colitis; Crohns; Hepatitis A; Hepatitis B; Hepatitis C Past Medical History Notes: fatty liver Endocrine Complaints and Symptoms: Negative for: Heat/cold intolerance Medical History: Positive for: Type II Diabetes Negative for: Type I Diabetes Time with diabetes: 9 yrs Treated with: Insulin Blood sugar tested every day: Yes T ested : 3 times per day Blood sugar testing results: Breakfast: 100; Lunch: 120; Dinner: 120-135 Genitourinary Complaints and Symptoms: Negative for: Frequent urination Medical History: Past Medical History Notes: CKD stage 3 Integumentary (Skin) Complaints and Symptoms: Negative for: Wounds Medical History: Negative for: History of Burn Musculoskeletal Complaints and Symptoms: Negative for: Muscle Pain; Muscle Weakness Medical History: Positive for: Osteoarthritis; Osteomyelitis - left calcaneus and spine Negative for: Gout; Rheumatoid Arthritis Neurologic Complaints and Symptoms: Negative for: Numbness/parasthesias Medical History: Positive for: Neuropathy Negative for: Dementia; Quadriplegia; Paraplegia; Seizure Disorder Past Medical History Notes: cervical ruptured disc Psychiatric Complaints and Symptoms: Negative for: Claustrophobia; Suicidal Medical History: Negative for: Anorexia/bulimia;  Confinement Anxiety Hematologic/Lymphatic Medical History: Positive for: Anemia Negative for: Hemophilia; Human Immunodeficiency Virus; Lymphedema; Sickle Cell Disease Immunological Medical History: Negative for: Lupus Erythematosus; Raynauds; Scleroderma Oncologic Medical History: Negative for: Received Chemotherapy; Received Radiation HBO Extended History Items Eyes: Cataracts Immunizations Pneumococcal Vaccine: Received Pneumococcal Vaccination: Yes Implantable Devices None Hospitalization / Surgery History Type of Hospitalization/Surgery left heel surgery hysterectomy cholecystectomy right elbow surgery x4 December 2021 Family and Social History Cancer: No; Diabetes: Yes - Maternal Grandparents,Paternal Grandparents,Father,Siblings; Heart Disease: Yes - Mother,Father,Siblings; Hereditary Spherocytosis: No; Hypertension: Yes - Father; Kidney Disease: No; Lung Disease: No; Seizures: No; Stroke: No; Thyroid Problems: No; Tuberculosis: No; Never smoker; Marital Status - Married; Alcohol Use: Never; Drug Use: No History; Caffeine Use: Moderate - tea; Financial Concerns: No; Food, Clothing or Shelter Needs: No; Support System Lacking: No; Transportation Concerns: No Electronic Signature(s) Signed: 01/10/2021 5:40:01 PM By: Rhae Hammock RN Signed: 01/10/2021 5:58:13 PM By: Linton Ham MD Entered By: Rhae Hammock on 01/10/2021 14:58:13 -------------------------------------------------------------------------------- SuperBill Details Patient Name: Date of Service: [name] [name], [name] [name]. 01/10/2021 Medical Record Number: [id number] Patient Account Number: [id number] Date of Birth/Sex: Treating RN: Nov 02, 1955 (66 y.o. 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 with diabetic polyneuropathy Facility Procedures CPT4 Code: 77939688 Description: 64847 - WOUND CARE VISIT-LEV 4 EST PT Modifier: Quantity: 1 CPT4 Code: 20721828 Description: 83374 - DEB SUBQ TISSUE 20 SQ CM/< ICD-10 Diagnosis Description 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 Modifier: Quantity: 1 CPT4 Code: [id number] Description: 99872 - DEB SUBQ TISS EA ADDL 20CM ICD-10 Diagnosis Description 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 Modifier: Quantity: 1 Physician Procedures : CPT4 Code Description Modifier [id number] 18485 - WC PHYS SUBQ TISS 20 SQ CM ICD-10 Diagnosis Description 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

## 2021-01-11 ENCOUNTER — Encounter: Admit: 2021-01-11 | Discharge: 2021-01-11 | Payer: MEDICARE

## 2021-01-11 ENCOUNTER — Ambulatory Visit: Admit: 2021-01-11 | Discharge: 2021-01-12 | Payer: MEDICARE

## 2021-01-11 DIAGNOSIS — E119 Type 2 diabetes mellitus without complications: Secondary | ICD-10-CM

## 2021-01-11 DIAGNOSIS — D509 Iron deficiency anemia, unspecified: Secondary | ICD-10-CM

## 2021-01-11 DIAGNOSIS — J309 Allergic rhinitis, unspecified: Secondary | ICD-10-CM

## 2021-01-11 DIAGNOSIS — F22 Delusional disorders: Secondary | ICD-10-CM

## 2021-01-11 DIAGNOSIS — G5600 Carpal tunnel syndrome, unspecified upper limb: Secondary | ICD-10-CM

## 2021-01-11 DIAGNOSIS — K2970 Gastritis, unspecified, without bleeding: Secondary | ICD-10-CM

## 2021-01-11 DIAGNOSIS — E559 Vitamin D deficiency, unspecified: Secondary | ICD-10-CM

## 2021-01-11 DIAGNOSIS — K589 Irritable bowel syndrome without diarrhea: Secondary | ICD-10-CM

## 2021-01-11 DIAGNOSIS — D649 Anemia, unspecified: Secondary | ICD-10-CM

## 2021-01-11 DIAGNOSIS — G9050 Complex regional pain syndrome I, unspecified: Secondary | ICD-10-CM

## 2021-01-11 DIAGNOSIS — E785 Hyperlipidemia, unspecified: Secondary | ICD-10-CM

## 2021-01-11 DIAGNOSIS — K5900 Constipation, unspecified: Secondary | ICD-10-CM

## 2021-01-11 DIAGNOSIS — Z09 Encounter for follow-up examination after completed treatment for conditions other than malignant neoplasm: Secondary | ICD-10-CM

## 2021-01-11 DIAGNOSIS — I10 Essential (primary) hypertension: Secondary | ICD-10-CM

## 2021-01-11 DIAGNOSIS — J45909 Unspecified asthma, uncomplicated: Secondary | ICD-10-CM

## 2021-01-11 DIAGNOSIS — M79642 Pain in left hand: Secondary | ICD-10-CM

## 2021-01-11 LAB — BASIC METABOLIC PANEL
Lab: 10 mg/dL (ref 8.5–10.6)
Lab: 106 MMOL/L — ABNORMAL LOW (ref 98–110)
Lab: 13 pg — ABNORMAL HIGH (ref 3–12)
Lab: 139 MMOL/L — ABNORMAL LOW (ref 137–147)
Lab: 2.1 mg/dL — ABNORMAL HIGH (ref >60)
Lab: 20 MMOL/L — ABNORMAL LOW (ref 21–30)
Lab: 219 mg/dL — ABNORMAL HIGH (ref 70–100)
Lab: 26 mL/min — ABNORMAL LOW (ref >60)
Lab: 30 mg/dL — ABNORMAL HIGH (ref 7–25)
Lab: 4.8 MMOL/L — ABNORMAL LOW (ref 3.5–5.1)

## 2021-01-11 LAB — CBC AND DIFF
Lab: 0.2 K/UL (ref 0–0.45)
Lab: 0.6 K/UL (ref 0–0.80)
Lab: 1 % (ref 0–2)
Lab: 1.6 K/UL (ref 1.0–4.8)
Lab: 20 % — ABNORMAL LOW (ref 24–44)
Lab: 3 % (ref 0–5)
Lab: 5.8 K/UL (ref 1.8–7.0)
Lab: 8 % (ref 4–12)
Lab: 8.4 K/UL (ref 4.5–11.0)

## 2021-01-11 MED ORDER — CLONIDINE HCL 0.2 MG PO TAB
.2 mg | ORAL_TABLET | Freq: Three times a day (TID) | ORAL | 3 refills | Status: AC
Start: 2021-01-11 — End: ?

## 2021-01-11 NOTE — Telephone Encounter (Signed)
Spoke with patient. Pt has appt on Friday and will pick up then

## 2021-01-12 ENCOUNTER — Encounter: Admit: 2021-01-12 | Discharge: 2021-01-12 | Payer: MEDICARE

## 2021-01-12 DIAGNOSIS — N183 Stage 3 chronic kidney disease, unspecified whether stage 3a or 3b CKD (HCC): Secondary | ICD-10-CM

## 2021-01-12 DIAGNOSIS — D509 Iron deficiency anemia, unspecified: Secondary | ICD-10-CM

## 2021-01-12 MED ORDER — BETAMETHASONE VALERATE 0.1 % TP CREA
Freq: Every day | 1 refills
Start: 2021-01-12 — End: ?

## 2021-01-12 NOTE — Telephone Encounter
Call Rhonda Fletcher about her lab results-    Her kidney function was better - it's still not back to her normal, but it's better than when she was in the hospital.  She can stay on the Metformin and hold off on taking the new diabetes medication (Tradjenta).    Her Hemoglobin was a little bit lower.  Make sure she is taking an iron supplement regularly - recommend  325mg  daily.    I would like to recheck her lab in 2 weeks to check on the anemia and also her kidney function - I will place orders.

## 2021-01-12 NOTE — Telephone Encounter
Pt notified.

## 2021-01-13 ENCOUNTER — Other Ambulatory Visit: Payer: Self-pay | Admitting: Family Medicine

## 2021-01-13 DIAGNOSIS — Z981 Arthrodesis status: Secondary | ICD-10-CM

## 2021-01-13 DIAGNOSIS — M8610 Other acute osteomyelitis, unspecified site: Secondary | ICD-10-CM

## 2021-01-13 DIAGNOSIS — G894 Chronic pain syndrome: Secondary | ICD-10-CM

## 2021-01-13 MED ORDER — HYDROCODONE-ACETAMINOPHEN 5-325 MG PO TABS: 1.0000 | ORAL_TABLET | Freq: Four times a day (QID) | ORAL | 0 refills | Status: DC | PRN

## 2021-01-13 NOTE — Telephone Encounter (Signed)
Requesting: NORCO Contract: 05/06/2018 UDS: 05/06/2018 Last OV: 12/30/2020 Next OV: 01/14/21 Last Refill: 12/16/2020, #120--0 RF Database:   Please advise

## 2021-01-13 NOTE — Telephone Encounter (Signed)
Medication: HYDROcodone-acetaminophen (NORCO/VICODIN) 5-325 MG tablet    Has the patient contacted their pharmacy? No. (If no, request that the patient contact the pharmacy for the refill.) (If yes, when and what did the pharmacy advise?)  Preferred Pharmacy (with phone number or street name): [facility] [address] [place], [place] - [address]  [address], [place] [address]  Phone:  [phone] Fax:  [phone]  DEA #:  --  Agent: Please be advised that RX refills may take up to 3 business days. We ask that you follow-up with your pharmacy.

## 2021-01-14 ENCOUNTER — Ambulatory Visit: Payer: PRIVATE HEALTH INSURANCE | Admitting: Family Medicine

## 2021-01-14 DIAGNOSIS — Z0289 Encounter for other administrative examinations: Secondary | ICD-10-CM

## 2021-01-18 ENCOUNTER — Ambulatory Visit (HOSPITAL_BASED_OUTPATIENT_CLINIC_OR_DEPARTMENT_OTHER)
Admission: RE | Admit: 2021-01-18 | Discharge: 2021-01-18 | Disposition: A | Discharge status: Home / Self Care | Payer: PRIVATE HEALTH INSURANCE | Source: Ambulatory Visit | Attending: Family Medicine | Admitting: Family Medicine

## 2021-01-18 ENCOUNTER — Other Ambulatory Visit (HOSPITAL_BASED_OUTPATIENT_CLINIC_OR_DEPARTMENT_OTHER): Payer: Self-pay

## 2021-01-18 ENCOUNTER — Ambulatory Visit (INDEPENDENT_AMBULATORY_CARE_PROVIDER_SITE_OTHER): Payer: PRIVATE HEALTH INSURANCE | Admitting: Family Medicine

## 2021-01-18 ENCOUNTER — Other Ambulatory Visit: Payer: Self-pay

## 2021-01-18 DIAGNOSIS — L089 Local infection of the skin and subcutaneous tissue, unspecified: Secondary | ICD-10-CM | POA: Diagnosis not present

## 2021-01-18 DIAGNOSIS — E11621 Type 2 diabetes mellitus with foot ulcer: Secondary | ICD-10-CM | POA: Insufficient documentation

## 2021-01-18 DIAGNOSIS — L97529 Non-pressure chronic ulcer of other part of left foot with unspecified severity: Secondary | ICD-10-CM | POA: Diagnosis present

## 2021-01-18 DIAGNOSIS — E11628 Type 2 diabetes mellitus with other skin complications: Secondary | ICD-10-CM | POA: Diagnosis not present

## 2021-01-18 DIAGNOSIS — L97512 Non-pressure chronic ulcer of other part of right foot with fat layer exposed: Secondary | ICD-10-CM | POA: Insufficient documentation

## 2021-01-18 DIAGNOSIS — N1832 Chronic kidney disease, stage 3b: Secondary | ICD-10-CM | POA: Diagnosis not present

## 2021-01-18 NOTE — Assessment & Plan Note (Signed)
Per nephrology 

## 2021-01-18 NOTE — Progress Notes (Signed)
[name], adult    DOB: 17-Nov-1955  Age: 65 y.o. MRN: [id number]    Subjective:  Subjective  HPI [name] [name] presents for office visit today. [name] [name] came today to fill out clinical paperwork for reimbursements from past medical treatments. [name] [name] denies chest pain, SOB, fever, abdominal pain, cough, chills, sore throat, dysuria, urinary incontinence, back pain, HA, or N/VD. [name] [name] saw the wound clinic and is under tx.    Review of Systems  Constitutional: Negative for chills, fatigue and fever.  HENT: Negative for congestion, rhinorrhea, sinus pressure, sinus pain and sore throat.   Eyes: Negative for pain and visual disturbance.  Respiratory: Negative for cough and shortness of breath.   Cardiovascular: Negative for chest pain, palpitations and leg swelling.  Gastrointestinal: Negative for abdominal pain, blood in stool, diarrhea, nausea and vomiting.  Genitourinary: Negative for flank pain, frequency, vaginal bleeding, vaginal discharge and vaginal pain.  Musculoskeletal: Negative for back pain and myalgias.  Neurological: Negative for headaches.    History Past Medical History:  Diagnosis Date  . Acute MI [facility]) 2007   presented to ED & had cardiac cath- but found to have normal coronaries. Since that point in time her PCP cares f or cardiac needs. Dr. Archie [name] - [facility]  . Anemia   . Anginal pain ([place])   . Anxiety   . Asthma   . Back pain 11/17/2019  . Bulging lumbar disc   . CAD (coronary artery disease) 01/11/2012  . Cataract   . 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 Cirrhosis of liver without mention of alcohol   . Constipation   . Dehiscence of closure of skin    left partial calcaneal excision  . Depression   . Diabetes mellitus    insulin dependent, adult onset  . Diarrhea 09/04/2019  . Episode of visual  loss of left eye   . Essential hypertension 12/23/2006   Qualifier: Diagnosis of  By: [name] Kells MD, [place] Exertional shortness of breath   . Fatty liver   . Fibromyalgia   . GERD (gastroesophageal reflux disease)   . Hepatic steatosis   . High cholesterol   . History of MI (myocardial infarction) 10/06/2013  . Hyperlipidemia 06/03/2008   Qualifier: Diagnosis of  By: [name] Kells MD, [place] Hypertension   . MRSA (methicillin resistant Staphylococcus aureus)   . Neuropathy    lower legs  . Osteoarthritis    hands, hips  . Proximal humerus fracture 10/15/12   Left  . PTSD (post-traumatic stress disorder)   . Renal insufficiency 05/05/2015  . 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 Sleep apnea 11/16/2017  . SOB (shortness of breath) 10/11/2020  . 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   Qualifier: Diagnosis of  By: [name] [name] CMA, [place]    . 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); Cardiac catheterization (2007); Skin graft split thickness leg / foot (Right, 03/19/2013); Cholecystectomy (1995); Abdominal hysterectomy (1979); Dilation and curettage of uterus (1977); I & D extremity (Right, 03/19/2013); I & D extremity (Left, 09/08/2016); I &  D extremity (Left, 09/29/2016); Incision and drainage (Right, 04/17/2019); IR US Guide Vasc Access Right (04/21/2019); IR Fluoro Guide CV Line Right (04/21/2019); IR Fluoro Guide CV Line Right (08/28/2019); IR US Guide Vasc Access Right (08/28/2019); IR Fluoro Guide CV Line Right (11/04/2019); IR Removal Tun Cv Cath W/O FL (11/18/2019); Esophagogastroduodenoscopy (egd) with propofol (N/A, 02/18/2020); biopsy (02/18/2020); IR US Guide Vasc Access Right (02/25/2020);  IR Fluoro Guide CV Line Right (02/25/2020); IR Removal Tun Cv Cath W/O FL (02/26/2020); Anterior lat lumbar fusion (N/A, 01/22/2020); Posterior lumbar fusion 4 level (N/A, 04/20/2978); and Application of robotic assistance for spinal procedure (N/A, 01/22/2020).   Her family history includes Cancer in her father; Colitis in her father; Crohn's disease in her father; Diabetes in her father and mother; Diabetes Mellitus I in her brother; Diabetes Mellitus II in her brother, brother, brother, and brother; Heart attack in her brother; Heart disease in her brother, brother, and father; Hypertension in her mother; Irritable bowel syndrome in her daughter; Kidney disease in her brother, brother, brother, brother, and brother; Leukemia in her father; Liver disease in her brother and brother; Mental illness in her mother.[name] [name] reports that [name] [name] has never smoked. [name] [name] has never used smokeless tobacco. [name] [name] reports that [name] [name] does not drink alcohol and does not use drugs.  Current Outpatient Medications on File Prior to Visit  Medication Sig Dispense Refill  . acetaminophen (TYLENOL) 325 MG tablet Take 2 tablets (650 mg total) by mouth every 6 (six) hours as needed for fever.    [name] Kitchen atorvastatin (LIPITOR) 10 MG tablet Take 1 tablet by mouth once daily 90 tablet 1  . Ciclopirox 1 % shampoo Massage in scalp and let sit 3 min and rinse 2 x weekly 120 mL 0  . Continuous Blood Gluc Receiver (FREESTYLE LIBRE 14 DAY READER) DEVI 1 Device by Does not apply route daily. 2 each 6  . Continuous Blood Gluc Sensor (FREESTYLE LIBRE 14 DAY SENSOR) MISC 1 Device by Does not apply route daily. 2 each 6  . diazepam (VALIUM) 5 MG tablet Take 1 tablet (5 mg total) by mouth every 12 (twelve) hours as needed for anxiety. 30 tablet 0  . doxycycline (VIBRA-TABS) 100 MG tablet Take 1 tablet (100 mg total) by mouth 2 (two) times daily. 20 tablet 0  . DULoxetine (CYMBALTA) 60 MG capsule Take 1 capsule (60 mg total) by mouth daily. 90 capsule 3  . folic  acid (FOLVITE) 1 MG tablet Take 2 tablets (2 mg total) by mouth daily. 60 tablet 6  . furosemide (LASIX) 20 MG tablet Take 20 mg by mouth daily.    [name] Kitchen HUMALOG KWIKPEN 200 UNIT/ML KwikPen 15 units sub-q twice daily    . HYDROcodone-acetaminophen (NORCO/VICODIN) 5-325 MG tablet Take 1 tablet by mouth every 6 (six) hours as needed for moderate pain. 120 tablet 0  . insulin glargine (LANTUS SOLOSTAR) 100 UNIT/ML Solostar Pen Injection 20 units in the morning and 40 units at night 15 mL 1  . lidocaine (LIDODERM) 5 % Place 1 patch onto the skin daily. Remove & Discard patch within 12 hours or as directed by MD 30 patch 0  . Multiple Vitamin (MULTIVITAMIN WITH MINERALS) TABS tablet Take 1 tablet by mouth daily.    . multivitamin (VIT W/EXTRA C) CHEW chewable tablet Chew 1 tablet by mouth daily.    . naftifine (NAFTIN) 1 % cream Apply topically daily. 30 g 0  . NYSTATIN powder APPLY  POWDER TOPICALLY THREE TIMES DAILY 15 g 0  .  pregabalin (LYRICA) 75 MG capsule Take 1 capsule (75 mg total) by mouth 3 (three) times daily. 270 capsule 1  . rivaroxaban (XARELTO) 2.5 MG TABS tablet Take by mouth.    . [DISCONTINUED] metFORMIN (GLUCOPHAGE) 1000 MG tablet Take 1,000 mg by mouth 2 (two) times daily with a meal.      . [DISCONTINUED] omeprazole (PRILOSEC) 20 MG capsule Take 20 mg by mouth daily.       No current facility-administered medications on file prior to visit.     Objective:  Objective  Physical Exam Constitutional:      General: [name] [name] is not in acute distress.    Appearance: Normal appearance. [name] [name] is well-developed. [name] [name] is not ill-appearing, toxic-appearing or diaphoretic.  HENT:     Head: Normocephalic and atraumatic.     Right Ear: External ear normal.     Left Ear: External ear normal.  Pulmonary:     Effort: Pulmonary effort is normal.  Musculoskeletal:     Cervical back: Normal range of motion.  Neurological:     Mental Status: [name] [name] is alert and oriented to person, place, and time.   Psychiatric:        Behavior: Behavior normal.    BP 122/70 (BP Location: Left Arm, Patient Position: Sitting, Cuff Size: Normal)   Pulse 74   Temp 97.9 F (36.6 C) (Oral)   Resp 18   SpO2 99%  Wt Readings from Last 3 Encounters:  11/23/20 169 lb (76.7 kg)  11/23/20 169 lb (76.7 kg)  10/15/20 184 lb (83.5 kg)     Lab Results  Component Value Date   WBC 6.5 11/23/2020   HGB 9.6 (L) 11/23/2020   HCT 31.0 (L) 11/23/2020   PLT 181 11/23/2020   GLUCOSE 128 (H) 11/23/2020   CHOL 135 06/24/2020   TRIG 151 (H) 06/24/2020   HDL 31 (L) 06/24/2020   LDLDIRECT 115.8 01/23/2012   LDLCALC 79 06/24/2020   ALT 8 11/23/2020   AST 13 (L) 11/23/2020   NA 138 11/23/2020   K 5.3 (H) 11/23/2020   CL 106 11/23/2020   CREATININE 2.55 (H) 11/23/2020   BUN 50 (H) 11/23/2020   CO2 25 11/23/2020   TSH 1.65 01/03/2016   INR 1.2 01/22/2020   HGBA1C 8.1 (H) 06/24/2020   MICROALBUR 26.3 (H) 11/26/2018    No results found.   Assessment & Plan:  Plan    No orders of the defined types were placed in this encounter.   Problem List Items Addressed This Visit      Unprioritized   CKD (chronic kidney disease), stage III [facility])    Per nephrology      Diabetic infection of left foot ([place])    Pt seeing wound clnic         voya paperwork filled out   Follow-up: No follow-ups on file.  I,Amarius Toto R Lowne Chase,acting as a scribe for [employer], DO.,have documented all relevant documentation on the behalf of Ann Held, DO,as directed by  Ann Held, DO while in the presence of [place], DO, have reviewed all documentation for this visit. The documentation on 01/18/21 for the exam, diagnosis, procedures, and orders are all accurate and complete.  Ann Held, DO

## 2021-01-18 NOTE — Assessment & Plan Note (Signed)
Pt seeing wound clnic

## 2021-01-19 ENCOUNTER — Inpatient Hospital Stay: Payer: PRIVATE HEALTH INSURANCE | Admitting: Family

## 2021-01-19 ENCOUNTER — Inpatient Hospital Stay: Payer: PRIVATE HEALTH INSURANCE

## 2021-01-21 ENCOUNTER — Other Ambulatory Visit (HOSPITAL_COMMUNITY): Payer: Self-pay | Admitting: Internal Medicine

## 2021-01-21 ENCOUNTER — Encounter (HOSPITAL_BASED_OUTPATIENT_CLINIC_OR_DEPARTMENT_OTHER): Payer: PRIVATE HEALTH INSURANCE | Admitting: Internal Medicine

## 2021-01-21 ENCOUNTER — Other Ambulatory Visit: Payer: Self-pay

## 2021-01-21 DIAGNOSIS — E11621 Type 2 diabetes mellitus with foot ulcer: Secondary | ICD-10-CM | POA: Diagnosis not present

## 2021-01-21 DIAGNOSIS — L97423 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: Secondary | ICD-10-CM

## 2021-01-21 NOTE — Progress Notes (Addendum)
MABRY, SANTARELLI ([id number]) Visit Report for 01/21/2021 Arrival Information Details Patient Name: Date of Service: [name] [name] [name], [name] 01/21/2021 3:00 PM Medical Record Number: [id number] Patient Account Number: [id number] Date of Birth/Sex: Treating RN: 15-Jun-1956 (65 y.o. [name] [name], Tammi Klippel Primary Care Niguel Moure: [name] Other Clinician: Referring Yurani Fettes: Treating Mannie Ohlin/Extender: [name] in Treatment: 1 Visit Information History Since Last Visit All ordered tests and consults were completed: Yes Patient Arrived: Wheel Chair Added or deleted any medications: No Arrival Time: 16:00 Any new allergies or adverse reactions: No Accompanied By: self Had a fall or experienced change in No Transfer Assistance: None activities of daily living that may affect Patient Identification Verified: Yes risk of falls: Secondary Verification Process Completed: Yes Signs or symptoms of abuse/neglect since No Patient Requires Transmission-Based Precautions: No last visito Patient Has Alerts: No Hospitalized since last visit: No Implantable device outside of the clinic No excluding cellular tissue based products placed in the center since last visit: Has Dressing in Place as Prescribed: Yes Has Footwear/Offloading in Place as Yes Prescribed: Left: Wedge [name] Right: Surgical [name] with Pressure Relief Insole Pain Present Now: No Electronic Signature(s) Signed: 01/21/2021 6:38:23 PM By: Deon Pilling Entered By: Deon Pilling on 01/21/2021 16:11:36 -------------------------------------------------------------------------------- Encounter Discharge Information Details Patient Name: Date of Service: [name] [name], [name] [name] 01/21/2021 3:00 PM Medical Record Number: [id number] Patient Account Number: [id number] Date of Birth/Sex: Treating RN: Jan 22, 1956 (65 y.o. [name] [name], Tammi Klippel Primary Care Willim Turnage: [name] Other Clinician: Referring  Keyleen Cerrato: Treating Kishana Battey/Extender: [name] in Treatment: 1 Encounter Discharge Information Items Post Procedure Vitals Discharge Condition: Stable Temperature (F): 97.9 Ambulatory Status: Wheelchair Pulse (bpm): 85 Discharge Destination: Home Respiratory Rate (breaths/min): 16 Transportation: Private Auto Blood Pressure (mmHg): 102/64 Accompanied By: self Schedule Follow-up Appointment: Yes Clinical Summary of Care: Electronic Signature(s) Signed: 01/21/2021 6:38:23 PM By: Deon Pilling Signed: 01/21/2021 6:38:23 PM By: Deon Pilling Entered By: Deon Pilling on 01/21/2021 18:24:55 -------------------------------------------------------------------------------- Lower Extremity Assessment Details Patient Name: Date of Service: [name] [name] [name] 01/21/2021 3:00 PM Medical Record Number: [id number] Patient Account Number: [id number] Date of Birth/Sex: Treating RN: January 23, 1956 (65 y.o. [name] [name] Primary Care Bernice Mcauliffe: [name] Other Clinician: Referring Darris Staiger: Treating Taurus Alamo/Extender: [name] in Treatment: 1 Edema Assessment Assessed: Shirlyn Goltz: Yes] Patrice Paradise: No] Edema: [Left: Yes] [Right: Yes] Calf Left: Right: Point of Measurement: 35 cm From Medial Instep 34 cm 35 cm Ankle Left: Right: Point of Measurement: 8 cm From Medial Instep 22 cm 23 cm Vascular Assessment Pulses: Dorsalis Pedis Palpable: [Left:Yes] [Right:Yes] Electronic Signature(s) Signed: 01/21/2021 6:38:23 PM By: Deon Pilling Entered By: Deon Pilling on 01/21/2021 16:12:49 -------------------------------------------------------------------------------- Multi Wound Chart Details Patient Name: Date of Service: [name] [name] [name] 01/21/2021 3:00 PM Medical Record Number: [id number] Patient Account Number: [id number] Date of Birth/Sex: Treating RN: [birth year] (65 y.o. Sue Lush Primary Care Larren Copes:  [name] Other Clinician: Referring Rejoice Heatwole: Treating Caoimhe Damron/Extender: [name] in Treatment: 1 Vital Signs Height(in): 61 Capillary Blood Glucose(mg/dl): 109 Weight(lbs): 162 Pulse(bpm): 44 Body Mass Index(BMI): 31 Blood Pressure(mmHg): 102/64 Temperature(F): 97.9 Respiratory Rate(breaths/min): 16 Photos: [10:Right, Lateral Foot] [9:Left Calcaneus] [N/A:N/A N/A] Wound Location: [10:Gradually Appeared] [9:Gradually Appeared] [N/A:N/A] Wounding Event: [10:Diabetic Wound/Ulcer of the Lower] [9:Diabetic Wound/Ulcer of the Lower] [N/A:N/A] Primary Etiology: [10:Extremity Cataracts, Anemia, Sleep Apnea,] [9:Extremity Cataracts, Anemia, Sleep Apnea,] [N/A:N/A] Comorbid History: [10:Coronary Artery Disease, Hypertension, Myocardial Infarction, Hypertension, Myocardial Infarction, Type II  Diabetes, Osteoarthritis, Osteomyelitis, Neuropathy 09/29/2020] [9:Coronary Artery Disease, Type II Diabetes, Osteoarthritis,  Osteomyelitis, Neuropathy 01/11/2019] [N/A:N/A] Date Acquired: [10:1] [9:1] [N/A:N/A] Weeks of Treatment: [10:Open] [9:Open] [N/A:N/A] Wound Status: [10:3x4.5x0.5] [9:4.7x4.2x0.9] [N/A:N/A] Measurements L x W x D (cm) [10:10.603] [9:15.504] [N/A:N/A] A (cm) : rea [10:5.301] [9:13.953] [N/A:N/A] Volume (cm) : [10:0.00%] [9:-33.40%] [N/A:N/A] % Reduction in A [10:rea: 64.30%] [9:45.40%] [N/A:N/A] % Reduction in Volume: [10:Grade 2] [9:Grade 2] [N/A:N/A] Classification: [10:Medium] [9:Medium] [N/A:N/A] Exudate A mount: [10:Serosanguineous] [9:Serosanguineous] [N/A:N/A] Exudate Type: [10:red, brown] [9:red, brown] [N/A:N/A] Exudate Color: [10:Distinct, outline attached] [9:Distinct, outline attached] [N/A:N/A] Wound Margin: [10:Large (67-100%)] [9:Large (67-100%)] [N/A:N/A] Granulation A mount: [10:Red, Pink] [9:Red, Pink] [N/A:N/A] Granulation Quality: [10:Small (1-33%)] [9:Small (1-33%)] [N/A:N/A] Necrotic A mount: [10:Fat Layer  (Subcutaneous Tissue): Yes Fat Layer (Subcutaneous Tissue): Yes N/A] Exposed Structures: [10:Fascia: No Tendon: No Muscle: No Joint: No Bone: No Small (1-33%)] [9:Fascia: No Tendon: No Muscle: No Joint: No Bone: No None] [N/A:N/A] Epithelialization: [10:Debridement - Excisional] [9:Debridement - Excisional] [N/A:N/A] Debridement: Pre-procedure Verification/Time Out 16:25 [9:16:25] [N/A:N/A] Taken: [10:Other] [9:Other] [N/A:N/A] Pain Control: [10:Callus, Subcutaneous] [9:Callus, Subcutaneous] [N/A:N/A] Tissue Debrided: [10:Skin/Subcutaneous Tissue] [9:Skin/Subcutaneous Tissue] [N/A:N/A] Level: [10:13.5] [9:19.74] [N/A:N/A] Debridement A (sq cm): [10:rea Curette] [9:Curette] [N/A:N/A] Instrument: [10:Moderate] [9:Moderate] [N/A:N/A] Bleeding: [10:Silver Nitrate] [9:Pressure] [N/A:N/A] Hemostasis A chieved: [10:Procedure was tolerated well] [9:Procedure was tolerated well] [N/A:N/A] Debridement Treatment Response: [10:3x4.5x0.5] [9:4.7x4.2x0.9] [N/A:N/A] Post Debridement Measurements L x W x D (cm) [10:5.301] [9:13.953] [N/A:N/A] Post Debridement Volume: (cm) [10:callous periowund.] [9:callous periwound.] [N/A:N/A] Assessment Notes: [10:Debridement] [9:Debridement] [N/A:N/A] Treatment Notes Wound #10 (Foot) Wound Laterality: Right, Lateral Cleanser Soap and Water Discharge Instruction: May shower and wash wound with dial antibacterial soap and water prior to dressing change. Peri-Wound Care Topical Primary Dressing KerraCel Ag Gelling Fiber Dressing, 4x5 in (silver alginate) Discharge Instruction: Apply silver alginate to wound bed as instructed Secondary Dressing Woven Gauze Sponge, Non-Sterile 4x4 in Discharge Instruction: Apply over primary dressing as directed. ABD Pad, 5x9 Discharge Instruction: Apply over primary dressing as directed. Secured With [employer], 4.5x3.1 (in/yd) Discharge Instruction: Secure with Kerlix as directed. Transpore Surgical Tape, 2x10  (in/yd) Discharge Instruction: Secure dressing with tape as directed. Compression Wrap Compression Stockings Add-Ons Wound #9 (Calcaneus) Wound Laterality: Left Cleanser Soap and Water Discharge Instruction: May shower and wash wound with dial antibacterial soap and water prior to dressing change. Peri-Wound Care Topical Primary Dressing KerraCel Ag Gelling Fiber Dressing, 4x5 in (silver alginate) Discharge Instruction: Apply silver alginate to wound bed as instructed Secondary Dressing Woven Gauze Sponge, Non-Sterile 4x4 in Discharge Instruction: Apply over primary dressing as directed. ABD Pad, 5x9 Discharge Instruction: Apply over primary dressing as directed. Secured With [employer], 4.5x3.1 (in/yd) Discharge Instruction: Secure with Kerlix as directed. Transpore Surgical Tape, 2x10 (in/yd) Discharge Instruction: Secure dressing with tape as directed. Compression Wrap Compression Stockings Add-Ons Electronic Signature(s) Signed: 01/24/2021 9:50:14 AM By: Linton Ham MD Signed: 01/24/2021 5:20:59 PM By: Lorrin Jackson Entered By: Linton Ham on 01/23/2021 08:27:49 -------------------------------------------------------------------------------- Multi-Disciplinary Care Plan Details Patient Name: Date of Service: [name] [name], [name] [name] 01/21/2021 3:00 PM Medical Record Number: [id number] Patient Account Number: [id number] Date of Birth/Sex: Treating RN: [birth year] (65 y.o. Sue Lush Primary Care Taelyn Nemes: [name] Other Clinician: Referring Jusiah Aguayo: Treating Dantre Yearwood/Extender: [name] in Treatment: 1 Active Inactive Pressure Nursing Diagnoses: Knowledge deficit related to management of pressures ulcers Goals: Patient/caregiver will verbalize understanding of pressure ulcer management Date Initiated: 01/10/2021 Target Resolution Date: 02/14/2021 Goal Status: Active Interventions: Assess offloading  mechanisms upon admission and as needed Assess potential for pressure ulcer upon admission and as needed Provide education on pressure ulcers Notes: Wound/Skin Impairment Nursing Diagnoses: Impaired tissue integrity Goals: Patient/caregiver will verbalize understanding of skin care regimen Date Initiated: 01/10/2021 Target Resolution Date: 02/14/2021 Goal Status: Active Ulcer/skin breakdown will have a volume reduction of 30% by week 4 Date Initiated: 01/10/2021 Target Resolution Date: 02/14/2021 Goal Status: Active Interventions: Assess patient/caregiver ability to obtain necessary supplies Assess patient/caregiver ability to perform ulcer/skin care regimen upon admission and as needed Assess ulceration(s) every visit Provide education on ulcer and skin care Treatment Activities: Skin care regimen initiated : 01/10/2021 Topical wound management initiated : 01/10/2021 Notes: Electronic Signature(s) Signed: 01/21/2021 3:45:17 PM By: Lorrin Jackson Entered By: Lorrin Jackson on 01/21/2021 15:45:16 -------------------------------------------------------------------------------- Pain Assessment Details Patient Name: Date of Service: [name] [name], [name] [name] 01/21/2021 3:00 PM Medical Record Number: [id number] Patient Account Number: [id number] Date of Birth/Sex: Treating RN: 1956/03/13 (65 y.o. [name] [name] Primary Care Dustie Brittle: [name] Other Clinician: Referring Alvia Tory: Treating Kariyah Baugh/Extender: [name] in Treatment: 1 Active Problems Location of Pain Severity and Description of Pain Patient Has Paino No Site Locations Rate the pain. Current Pain Level: 0 Pain Management and Medication Current Pain Management: Medication: No Cold Application: No Rest: No Massage: No Activity: No T.E.N.S.: No Heat Application: No Leg drop or elevation: No Is the Current Pain Management Adequate: Adequate How does your wound impact your  activities of daily livingo Sleep: No Bathing: No Appetite: No Relationship With Others: No Bladder Continence: No Emotions: No Bowel Continence: No Work: No Toileting: No Drive: No Dressing: No Hobbies: No Electronic Signature(s) Signed: 01/21/2021 6:38:23 PM By: Deon Pilling Entered By: Deon Pilling on 01/21/2021 16:12:06 -------------------------------------------------------------------------------- Patient/Caregiver Education Details Patient Name: Date of Service: [name] [name], [name] [name] 3/25/2022andnbsp3:00 PM Medical Record Number: [id number] Patient Account Number: [id number] Date of Birth/Gender: Treating RN: [birth year] (65 y.o. Sue Lush Primary Care Physician: [name] Other Clinician: Referring Physician: Treating Physician/Extender: [name] in Treatment: 1 Education Assessment Education Provided To: Patient Education Topics Provided Offloading: Methods: Explain/Verbal, Printed Responses: State content correctly Wound/Skin Impairment: Methods: Demonstration, Explain/Verbal, Printed Responses: State content correctly Electronic Signature(s) Signed: 01/21/2021 6:16:52 PM By: Lorrin Jackson Entered By: Lorrin Jackson on 01/21/2021 15:47:58 -------------------------------------------------------------------------------- Wound Assessment Details Patient Name: Date of Service: [name] [name], [name] [name] 01/21/2021 3:00 PM Medical Record Number: [id number] Patient Account Number: [id number] Date of Birth/Sex: Treating RN: June 04, 1956 (65 y.o. [name] [name], [name] Primary Care Ginia Rudell: [name] Other Clinician: Referring Marguis Mathieson: Treating Iyonnah Ferrante/Extender: [name] in Treatment: 1 Wound Status Wound Number: 10 Primary Diabetic Wound/Ulcer of the Lower Extremity Etiology: Wound Location: Right, Lateral Foot Wound Open Wounding Event: Gradually Appeared Status: Date  Acquired: 09/29/2020 Comorbid Cataracts, Anemia, Sleep Apnea, Coronary Artery Disease, Weeks Of Treatment: 1 History: Hypertension, Myocardial Infarction, Type II Diabetes, Clustered Wound: No Osteoarthritis, Osteomyelitis, Neuropathy Photos Wound Measurements Length: (cm) 3 Width: (cm) 4.5 Depth: (cm) 0.5 Area: (cm) 10.603 Volume: (cm) 5.301 % Reduction in Area: 0% % Reduction in Volume: 64.3% Epithelialization: Small (1-33%) Tunneling: No Undermining: No Wound Description Classification: Grade 2 Wound Margin: Distinct, outline attached Exudate Amount: Medium Exudate Type: Serosanguineous Exudate Color: red, brown Foul Odor After Cleansing: No Slough/Fibrino Yes Wound Bed Granulation Amount: Large (67-100%) Exposed Structure Granulation Quality: Red, Pink Fascia Exposed: No Necrotic Amount: Small (1-33%) Fat Layer (Subcutaneous Tissue) Exposed: Yes Necrotic Quality: Adherent  Slough Tendon Exposed: No Muscle Exposed: No Joint Exposed: No Bone Exposed: No Assessment Notes callous periowund. Treatment Notes Wound #10 (Foot) Wound Laterality: Right, Lateral Cleanser Soap and Water Discharge Instruction: May shower and wash wound with dial antibacterial soap and water prior to dressing change. Peri-Wound Care Topical Primary Dressing KerraCel Ag Gelling Fiber Dressing, 4x5 in (silver alginate) Discharge Instruction: Apply silver alginate to wound bed as instructed Secondary Dressing Woven Gauze Sponge, Non-Sterile 4x4 in Discharge Instruction: Apply over primary dressing as directed. ABD Pad, 5x9 Discharge Instruction: Apply over primary dressing as directed. Secured With [employer], 4.5x3.1 (in/yd) Discharge Instruction: Secure with Kerlix as directed. Transpore Surgical Tape, 2x10 (in/yd) Discharge Instruction: Secure dressing with tape as directed. Compression Wrap Compression Stockings Add-Ons Electronic Signature(s) Signed: 01/21/2021 6:38:23 PM  By: Deon Pilling Signed: 01/26/2021 9:05:34 AM By: [name] Entered By: [name] on 01/21/2021 17:14:09 -------------------------------------------------------------------------------- Wound Assessment Details Patient Name: Date of Service: [name] [name], [name] [name] 01/21/2021 3:00 PM Medical Record Number: [id number] Patient Account Number: [id number] Date of Birth/Sex: Treating RN: 16-Apr-1956 (65 y.o. [name] [name], [name] Primary Care Mikeala Girdler: [name] Other Clinician: Referring Nat Lowenthal: Treating Danijah Noh/Extender: [name] in Treatment: 1 Wound Status Wound Number: 9 Primary Diabetic Wound/Ulcer of the Lower Extremity Etiology: Wound Location: Left Calcaneus Wound Open Wounding Event: Gradually Appeared Status: Date Acquired: 01/11/2019 Comorbid Cataracts, Anemia, Sleep Apnea, Coronary Artery Disease, Weeks Of Treatment: 1 History: Hypertension, Myocardial Infarction, Type II Diabetes, Clustered Wound: No Osteoarthritis, Osteomyelitis, Neuropathy Photos Wound Measurements Length: (cm) 4.7 Width: (cm) 4.2 Depth: (cm) 0.9 Area: (cm) 15.504 Volume: (cm) 13.953 % Reduction in Area: -33.4% % Reduction in Volume: 45.4% Epithelialization: None Tunneling: No Undermining: No Wound Description Classification: Grade 2 Wound Margin: Distinct, outline attached Exudate Amount: Medium Exudate Type: Serosanguineous Exudate Color: red, brown Foul Odor After Cleansing: No Slough/Fibrino Yes Wound Bed Granulation Amount: Large (67-100%) Exposed Structure Granulation Quality: Red, Pink Fascia Exposed: No Necrotic Amount: Small (1-33%) Fat Layer (Subcutaneous Tissue) Exposed: Yes Necrotic Quality: Adherent Slough Tendon Exposed: No Muscle Exposed: No Joint Exposed: No Bone Exposed: No Assessment Notes callous periwound. Treatment Notes Wound #9 (Calcaneus) Wound Laterality: Left Cleanser Soap and Water Discharge  Instruction: May shower and wash wound with dial antibacterial soap and water prior to dressing change. Peri-Wound Care Topical Primary Dressing KerraCel Ag Gelling Fiber Dressing, 4x5 in (silver alginate) Discharge Instruction: Apply silver alginate to wound bed as instructed Secondary Dressing Woven Gauze Sponge, Non-Sterile 4x4 in Discharge Instruction: Apply over primary dressing as directed. ABD Pad, 5x9 Discharge Instruction: Apply over primary dressing as directed. Secured With [employer], 4.5x3.1 (in/yd) Discharge Instruction: Secure with Kerlix as directed. Transpore Surgical Tape, 2x10 (in/yd) Discharge Instruction: Secure dressing with tape as directed. Compression Wrap Compression Stockings Add-Ons Electronic Signature(s) Signed: 01/21/2021 6:38:23 PM By: Deon Pilling Signed: 01/26/2021 9:05:34 AM By: [name] Entered By: [name] on 01/21/2021 17:15:09 -------------------------------------------------------------------------------- Vitals Details Patient Name: Date of Service: [name] [name], [name] [name] 01/21/2021 3:00 PM Medical Record Number: [id number] Patient Account Number: [id number] Date of Birth/Sex: Treating RN: August 02, 1956 (66 y.o. [name] [name], Tammi Klippel Primary Care Jolina Symonds: [name] Other Clinician: Referring Yaris Ferrell: Treating Jesslyn Viglione/Extender: [name] in Treatment: 1 Vital Signs Time Taken: 16:00 Temperature (F): 97.9 Height (in): 61 Pulse (bpm): 85 Weight (lbs): 162 Respiratory Rate (breaths/min): 16 Body Mass Index (BMI): 30.6 Blood Pressure (mmHg): 102/64 Capillary Blood Glucose (mg/dl): 109 Reference Range: 80 - 120 mg /  dl Electronic Signature(s) Signed: 01/21/2021 6:38:23 PM By: Deon Pilling Entered By: Deon Pilling on 01/21/2021 16:12:53

## 2021-01-24 NOTE — Progress Notes (Signed)
JUSTYCE, YEATER ([id number]) Visit Report for 01/21/2021 Debridement Details Patient Name: Date of Service: [name], [name] 01/21/2021 3:00 PM Medical Record Number: [id number] Patient Account Number: [id number] Date of Birth/Sex: Treating RN: 1956-09-28 (65 y.o. Sue Lush Primary Care Provider: [name] Other Clinician: Referring Provider: Treating Provider/Extender: [name] in Treatment: 1 Debridement Performed for Assessment: Wound #10 Right,Lateral Foot Performed By: Physician Ricard Dillon., MD Debridement Type: Debridement Severity of Tissue Pre Debridement: Fat layer exposed Level of Consciousness (Pre-procedure): Awake and Alert Pre-procedure Verification/Time Out Yes - 16:25 Taken: Start Time: 16:26 Pain Control: Other : Benzocaine T Area Debrided (L x W): otal 3 (cm) x 4.5 (cm) = 13.5 (cm) Tissue and other material debrided: Non-Viable, Callus, Subcutaneous Level: Skin/Subcutaneous Tissue Debridement Description: Excisional Instrument: Curette Bleeding: Moderate Hemostasis Achieved: Silver Nitrate End Time: 16:31 Response to Treatment: Procedure was tolerated well Level of Consciousness (Post- Awake and Alert procedure): Post Debridement Measurements of Total Wound Length: (cm) 3 Width: (cm) 4.5 Depth: (cm) 0.5 Volume: (cm) 5.301 Character of Wound/Ulcer Post Debridement: Stable Severity of Tissue Post Debridement: Fat layer exposed Post Procedure Diagnosis Same as Pre-procedure Electronic Signature(s) Signed: 01/24/2021 9:50:14 AM By: Linton Ham MD Signed: 01/24/2021 5:20:59 PM By: Lorrin Jackson Previous Signature: 01/21/2021 6:16:52 PM Version By: Lorrin Jackson Entered By: Linton Ham on 01/23/2021 08:28:04 -------------------------------------------------------------------------------- Debridement Details Patient Name: Date of Service: [name], [name] 01/21/2021 3:00 PM Medical  Record Number: [id number] Patient Account Number: [id number] Date of Birth/Sex: Treating RN: 1956/02/12 (65 y.o. Sue Lush Primary Care Provider: [name] Other Clinician: Referring Provider: Treating Provider/Extender: [name] in Treatment: 1 Debridement Performed for Assessment: Wound #9 Left Calcaneus Performed By: Physician Ricard Dillon., MD Debridement Type: Debridement Severity of Tissue Pre Debridement: Fat layer exposed Level of Consciousness (Pre-procedure): Awake and Alert Pre-procedure Verification/Time Out Yes - 16:25 Taken: Start Time: 16:31 Pain Control: Other : Benzocaine T Area Debrided (L x W): otal 4.7 (cm) x 4.2 (cm) = 19.74 (cm) Tissue and other material debrided: Non-Viable, Callus, Subcutaneous Level: Skin/Subcutaneous Tissue Debridement Description: Excisional Instrument: Curette Bleeding: Moderate Hemostasis Achieved: Pressure End Time: 16:37 Response to Treatment: Procedure was tolerated well Level of Consciousness (Post- Awake and Alert procedure): Post Debridement Measurements of Total Wound Length: (cm) 4.7 Width: (cm) 4.2 Depth: (cm) 0.9 Volume: (cm) 13.953 Character of Wound/Ulcer Post Debridement: Stable Severity of Tissue Post Debridement: Fat layer exposed Post Procedure Diagnosis Same as Pre-procedure Electronic Signature(s) Signed: 01/24/2021 9:50:14 AM By: Linton Ham MD Signed: 01/24/2021 5:20:59 PM By: Lorrin Jackson Previous Signature: 01/21/2021 6:16:52 PM Version By: Lorrin Jackson Entered By: Linton Ham on 01/23/2021 08:28:18 -------------------------------------------------------------------------------- HPI Details Patient Name: Date of Service: [name], [name] 01/21/2021 3:00 PM Medical Record Number: [id number] Patient Account Number: [id number] Date of Birth/Sex: Treating RN: May 10, 1956 (65 y.o. Sue Lush Primary Care Provider: [name]  Other Clinician: Referring Provider: Treating Provider/Extender: [name] in Treatment: 1 History of Present Illness HPI Description: ADMISSION 11/12/2018 This is a 65 year old woman with type 2 diabetes and diabetic neuropathy. She has been dealing with a left heel plantar wound for roughly 2 years. She states this started when she pulled some skin off the area and it progressed into a wound. She also has an area on the tip of her left great toe for 1 year. She has been largely followed by Dr. Sharol Given and she had  an excision of bone in the left heel in 2017. I do not see microbiology from this excision or pathology. Apparently this wound never really closed. She most recently has been using Silvadene cream. Offloading this with a scooter. The last MRI I see was in June 2018 which did not show osteomyelitis at that time. Apparently this wound is never really progressed towards healing. During her last review by Dr. Sharol Given in October it was recommended that she undergo a left BKA and she refused. She went to see a second orthopedic consult at [facility] who am recommended conservative wound care to see if this will close or progress towards closure but also warned that possible surgery may be necessary. An x-ray that was done at [facility] showed an irregular calcaneal body and calcaneal tuberosity. This is probably postprocedural. Previous x-rays at [facility] had suggested heterotrophic calcifications but I do not see this. Apparently a second ointment was added to the Silvadene which the patient thinks is because some improvement. She has not been systemically unwell. No fever or chills. She thinks the second ointment that was given to her at [facility] has helped somewhat. 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 ABIs done in June 2018 showed a ABI of 1.12 on the right and 1.1 on the left she was biphasic bilaterally. ABIs in our clinic were noncompressible today. 11/20/18 on evaluation today patient is seen for her second visit here in the office although this is actually the first visit with me concerning an issue that she's having with her great toe and heel of the left foot. Fortunately she does not appear to be have any discomfort at this time and she does have a scooter in order to offload her foot she also has a boot for offloading. With that being said this is something that appears to have been going on for some time she was seeing Dr. due to his recommendation was that she was going to require a below knee amputation. With that being said she wanted to come to the wound center but according to the patient Dr. Sharol Given told her that "we could not help her". Nonetheless she saw another provider who suggested that it may be worth a shot for [name] to try and help her out if it all possible. Nonetheless she decided that it would be worth a trial since otherwise any the way she's gonna end up with an amputation. Obviously if we can heal the wound then that will not be the case. Again I explained to her that obviously there are no guarantees but we will give this a good try and attempts to get the wound to heal. 1/30; the patient continues to have areas on the left plantar heel and the left plantar great toe. The more worrisome area is the heel. She went for MRI on Saturday but this could not be done because she had silver alginate in the wound bed. I would like to get this rebooked. If she does not have osteomyelitis she will need a total contact cast. We have been using silver alginate in the wounds 2/7; patient has wounds on her left plantar heel and left plantar great toe.  The  area on the heel has some depth although it looks about the same today. Her MRI will finally be done tomorrow. If she has osteomyelitis in the heel and then we will need to consider her for IV antibiotics and hyperbaric oxygen. If the MRI is negative she will need a total contact cast although she is wearing a cam boot and using a scooter at present. Will be using silver alginate. She will take it off tomorrow in preparation for the MRI 2/14; the patient's MRI showed extensive surgical changes with a large portion of her calcaneus removed on the left secondary to her previous surgery by Dr. Sharol Given however there is no evidence of osteomyelitis. She would therefore be a candidate for a total contact cast and we applied this for the first time today 2/21; silver collagen total contact cast. The area on the plantar left great toe is "healed" still a lot of callus on this area. Dimensions on the plantar heel not too much different some epithelialization is present however. This is an improvement 12/25/18 on evaluation today patient actually is seen for follow-up concerning issues that she has been having with her cast she states she feels like it's wet and squishy in the bottom of her cast. The reason she does come in to have this evaluated and see what is going on. Fortunately there's no signs of infection at this time was the cast was removed. 3/6; the patient's area on the tip of her right great toe is callused but there is no open area here. She still has the fairly extensive area in the left heel. We have been using silver collagen in the wound. 01/08/19 on evaluation today patient actually appears to be doing very well in regard to her heel ulcer in my pinion to see if you shown signs of improvement which is excellent news. There's no evidence of infection. She continues to have quite a bit of drainage but fortunately again this doesn't seem to be hindering her healing. 3/20 -Patient's foot ulcer on  the left appears to be doing well, the dimensions appear encouraging, we have been using total contact cast with Prisma and will continue doing that 3/27; patient continues to make nice progress on the left plantar heel. She is using silver collagen under a total contact cast. It is been a while since I have seen this wound and it really looks a lot better. Smaller with healthy granulation 4/3; she continues to make nice progress on the left plantar heel. Using silver collagen under a total contact cast 4/10; left plantar heel. Again skin over the surface of the wound with not much in the way of adherence. This results in undermining. Using silver collagen changed to silver alginate 4/17 left plantar heel. Again not much improvement. There is no undermining today no debridement was required we used silver alginate last time because of excessive moisture 4/24 left plantar heel. Again not as much improvement as I would have liked. About 3 mm of depth. Thick callused skin around the circumference. Using silver alginate 5/1; left plantar heel this is improved this week. Less depth epithelialization is present. Using silver alginate alginate under a total contact cast 5/8; left plantar heel dimensions are about the same. The depth appears to be improved. I use silver collagen starting today under the cast 5/15 left plantar heel. Arrives today with a 2-day history of feeling like something had "slipped" while walking her dog in the cast. Unfortunately extensive area relatively to the  small wound of undermining superiorly denuded epithelium. 5/22-Patient returns at 1 week after being taken off the [facility] on account of some fluid collection that was debrided and cultured at last visit from the plantar ulcer on the left heel. The culture results are polymicrobial with Klebsiella, enterococcus, Proteus growth these organisms are sensitive to Cipro except with enterococcus which is sensitive to ampicillin but  patient is highly allergic to penicillin according to her. 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 5/29; the patient arrived last week with a new wound on her left plantar great toe. With regards to the culture that I did 2 weeks ago of her deteriorating heel wound this grew Klebsiella and enterococcus. She was given Cipro however the enterococcus would not be covered well by a quinolone. She is allergic to penicillin. I will give her linezolid 600 twice daily x5 days 6/12; the patient continues to have a wound on her left and after she returns to the beach there is undermining laterally. She has the new wound from this 2 weeks or so ago on the plantar tip of her left great toe. She had a fall today scraping the dorsal surface of the left fifth 7/14; READMISSION since the patient was last here she was hospitalized from 04/15/2019 through 04/22/2019. She had presented to an outside ER after falling and hitting her elbow. She had had previous surgery on the elbow and fractures several years ago. An x-ray was negative and she was sent home. She is readmitted with sepsis and acute renal failure secondary to septic arthritis of the elbow. Cultures apparently showed pansensitive staph aureus however she has a severe beta-lactam allergy. She was treated with vancomycin. Apparently the vancomycin is completed and her PICC line is removed although she is seeing Dr. Megan Salon tomorrow due to continued pain and swelling. In the hospital she had an IandD by Dr. [name] of orthopedics. The original surgery was on 04/17/2019 and it was felt that she had septic arthritis. She is still having a lot of pain and swelling in the right elbow and apparently is due to see Dr. Megan Salon tomorrow and what she thinks is that she will be restarted on antibiotics. With regards to her heel wounds/left foot wounds. Her arterial studies were  checked and her ABIs were within normal limits. X-ray showed plantar foot ulcer negative for osteomyelitis postop resection of the posterior calcaneus. She has been using silver alginate to the heel 8/4-Patient returns after being seen on 7/14, we are using silver alginate to the calcaneal wound, she is continuing to receive care for her right elbow septic arthritis 8/14- Patient returns after 1 week, she is being seen by the surgical group for her right elbow, she is here for the left calcaneal wound for which we are using silver alginate this is about the same 8/21; this is a patient I readmitted to the clinic 5 weeks ago. She is continuing to have difficulties with a septic arthritis of the right elbow she had after a fall and apparently has had surgical IandD's since the last time we saw her. She is also following with Dr. Megan Salon of infectious disease and is apparently on 3 oral antibiotics although at the time of this dictation I am not sure what they are. She comes in today with a necrotic surface on the left great toe with a blister laterally. This was still clearly an open  wound. She had purulent drainage coming out of this. The original wound on the plantar aspect of her right heel in the setting of a Charcot foot is deep not open to bone but certainly not any better at all. We have been using silver alginate 8/28; dealing with septic arthritis of the right elbow. May need to go for further surgery here in the second week of September. She had a blister on the left great toe that was purulent [place] draining last week although a culture did not grow anything [already on antibiotics through infectious disease for her elbow]. The punched-out area on her heel is just like it was when she first came in. This almost closed with a total contact cast there are no options for that now. The patient states she cannot stay off her foot having to do housework X-ray of the foot showed a moderate bunion  and severe degenerative changes at the first metatarsal phalangeal joint there was a moderate plantar calcaneal spur. We will need to check the location of this. 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 It is oral. We are using silver collagen to the punched-out area on her left heel and to the summer superficial area of the left great toe. She is offloading this is best she can although judging by the amount of callus it is not enough. She thinks she has enough strength in her right elbow now to use her scooter. 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 Hale Bogus notes and infectious disease. We are using silver collagen to the punched-out areas on her left heel and to the plantar aspect of her left great toe she is now using her scooter to get around and to help offload these areas 10/2; 2-week follow-up. Still on clindamycin I believe for the left elbow she is going for an MRI of the elbow over the weekend. She is then going to see orthopedics and infectious disease. The area on the left heel is certainly no better. This does not probe to bone however there is green drainage. She has an area on the tip of her toe. The area on the heel is in a wound we almost closed at one point with total contact casting. 10/9; one-week follow-up. Culture I did of the heel last week which was a swab culture admittedly showed moderate Enterococcus faecalis moderate Pseudomonas. The wound itself looks about the same. There is no palpable bone although the depth of it closely approximates bone. The heel is swollen but not erythematous. Her MRI is booked for next week. She also has an MRI of the  right elbow. I've also lifted the last consult from Dr. Megan Salon who is following her for septic arthritis of the right elbow. I did not see him specifically comment on her left heel however the patient states that he is aware of this. I did not specifically address the organisms I cultured last week because of the possible effect of any additional cultures that will be done on the elbow. Additionally these were superficial not bone cultures 10/16; her MRI of the heel was put back to next week. She  did have her elbow done. I have been in contact with Dr. Megan Salon about my concerns about osteomyelitis of the left heel. We have been using collagen. She also has a wound on the plantar tip of her first toe 10/23; her MRI of the heel was negative for osteomyelitis but suggested a small abscess. She also had an MRI of her elbow that showed findings compatible with a septic joint. She went to see Dr. Megan Salon and she is going to have both oral and IV antibiotics which I am sure will cover any infection in the heel. I did not actually see his note today. We are using silver collagen offloading the heel with a heel offloading boot 11/6; patient continues with silver collagen to both wound areas on her plantar left heel and plantar left great toe. She remains on daptomycin by Dr. Megan Salon of infectious disease. She complains of diarrhea. Dr. Megan Salon is aware of this. 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 she has been put on oral vancomycin I think a lot of high clinical suspicion for pseudomembranous colitis Because of the right elbow there are no options to completely off her left foot beyond the heel offloading boot we have now. Fortunately the left heel ulcer itself has remained static some improvement in the plantar left great toe 11/20; patient arrives for review  of her left heel ulcer. I also note she has a difficult time with septic arthritis of the right elbow. She currently is on IV daptomycin which seems to have helped the drainage in her elbow [MSSA]. Because of high concern for pseudomembranous colitis she was also put on vancomycin orally and Flagyl orally. She was on Levaquin but that was discontinued because of the concern about C. difficile. She states her diarrhea is better. She arrives in clinic today with a large area of denuded skin on the lateral part of the heel. This had totally separated. We have been using silver alginate to the wound areas. She arrived in a scooter telling me she is offloading the foot is much as possible. 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 Dr. Megan Salon stopped the daptomycin and now has her on vancomycin 1000 mg IV daily. This is supposed to go onto 1/18. She is being referred to [facility] for what sounds like a elbow replacement type operation for her MSSA septic arthritis. She arrives in clinic today with a blister on the medial part of the wound on her heel. She still has a lot of callus around the heel although the surface of the wound on the left heel looks somewhat better. READMISSION 03/19/2020 Mrs. Shadrick is a 65 year old woman we have followed for almost all of 2020 with a neuropathic wound on her left heel and the tip of her left great toe.. We she had previously had a partial calcanectomy by Dr. Sharol Given because of underlying osteomyelitis. She also had a history of MRSA even when we admitted her to the clinic. An MRI when she first came into our clinic did not show osteomyelitis. We put her in a total contact cast and gradually this contracted to the point it was almost closed however in that same timeframe she  had a fall. 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 We had to stop putting her in a cast partially because of infection and partially because she could not support herself with the elbow. Things went progressively downhill. When we last saw her at the end of December she had a large open wound on the left heel. She had had recurrent infections in the right elbow. It was felt that either the heel lift: Ice her elbow or vice versa. We never had MRSA I do not believe cultured in the left heel however. 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 She underwent an operative debridement and a T11-L3 fusion. An operative culture grew Pseudomonas. She was treated with cefepime and required readmission to the hospital with acute kidney failure requiring temporary dialysis. She was discharged to rehab I believe she signed herself out [place]. He has been following with her primary doctor and she comes in the clinic today with miraculously the left heel totally closed. She is followed up with Dr. Megan Salon of infectious disease he does not feel she has any evidence of infection in her elbow or her heel for that matter and the heel is actually fully epithelialized. She is on ciprofloxacin 500 twice daily I think mostly directed at Pseudomonas. The patient is convinced that it was the treatment of the Pseudomonas in her back that ultimately led to closure of her heel and that certainly possible or could have been because she was nonambulatory in the hospital for 3 months according to her. She is still using her heel offloading boot and she says she is "learning to walk again" Readmission: 06/09/2020 patient presents today for reevaluation here in the clinic following a reopening of her heel ulcer. She tells [name] that this actually occurred about 2 weeks after she was last seen in May of this year though it is now the  beginning of August and she has not come back until now to see [name]. Nonetheless she does have an open wound there is been no x-rays at this time infectious disease has been monitoring him following this up until this point. They made the recommendation that she come to see [name]. Fortunately there is no signs of active infection systemically though she does tell me that she had Pseudomonas in the spine when she had her back surgery she is currently on antibiotics for that. 8/20; this is a patient who I saw 1 time in late May. Apparently shortly after she was here the area on her plantar left heel started to drain again and she had an open wound. She has been going to her primary physician and has been following with Dr. Megan Salon. 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 She is currently on 3 times a day prophylactic ciprofloxacin. She is admitted to the clinic last week for review of the wound on the left heel. Notable for the fact that she had a partial calcanectomy by Dr. Sharol Given 3 or 4 years ago. She says she is using her motorized wheelchair again as an off loader but she came in in regular shoes. 9/2; 2-week follow-up. Plantar left heel. In spite of debridement last week she had thick very adherent callus around the circumference of the wound and a nonviable gray surface. We have been using Hydrofera  Blue. We gave her a heel offloading boot last week. She says she is off her foot except for she needs to walk her dog. 9/17; 2 week follow-up. Lives with a heel ulcer in roughly the same condition as a month ago. Though requiring debridement and revision using Hydrofera Blue. She says she is often except for times that she needs to walk her dog and she claims to be using the heel offloading shoe. She has a new injury which she says was a burn on the medial part of her fifth metacarpophalangeal on the  right. 10/15; almost 1 month follow-up. She arrives with the plantar left heel ulcer and not a very good condition. She has thick callus surrounding the wound and a necrotic base over the top of this. I am not sure anybody is paying close attention to this. She says she walks minimally but I highly doubt this. She is supposed to be using Hydrofera Blue. She says she has plenty of supplies. She tells me that she is traveling to [place] next week for a 10-day trip. We will see her back after that She has both been approved for Dermagraft however I am only willing to attempt this if she is going to agree to a total contact cast. I do not think there is any point in applying this advance dressing when she is walking on this excessively READMISSION 01/10/2021 The patient was last here in October at which time we were dealing with a very difficult wound on the left plantar heel. It was not in very good condition at the time. She tells me she went to [place] on vacation. Shortly after she arrived back her husband was in an accident and then after that she had congestive heart failure secondary to her advanced chronic renal failure. I thought she told me she was admitted to hospital but I certainly do not see this at [facility] health. In any case while she was markedly fluid overloaded she developed an open area on the right lateral heel to go along with the original wound on the left. Both of these completely covered with necrotic surface. I do not know that she is been dressing these at all. She has a heel offloading boot on the left and an ordinary shoe on the right. Before she left last time I did an MRI of the left heel that did not show distinctly osteomyelitis. She was approved for Dermagraft but the wounds are certainly not in a state for application of an advanced treatment product at this point Besides this her past medical history is largely unchanged. 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 Culture at that point showed Pseudomonas. She is not currently on antibiotics. She was on suppressive ciprofloxacin for the spine osteomyelitis but she says this was stopped by Dr. Megan Salon. Her primary doctor recently gave her a course of doxycycline but she could not tolerate it because of nausea and a rash ABIs in our clinic were 1.13 on the right and 1.15 on the left 3/25; patient comes back to see [name] after re-presenting last week with large ulcers on her bilateral heels. 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 She  also has chronic renal failure. 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 She presents spent a prolonged period of time on antibiotics. Culture of the lumbar area I believes had Pseudomonas whereas peripheral cultures of the elbow showed MRSA. She presented last week with deep ulcers on her bilateral plantar heel and the lateral part of her right heel. She has never offloaded this properly. PLAIN X-rays of both heels did not show osteomyelitisin either heel.we've been using silver alginate while we work through the possibilities of coexistent infection/osteomyelitis. She is wearing bilateral heel offloading boots although I've never been certain about the adequacy of her offloading. She also has a Chief Operating Officer) Signed: 01/24/2021 9:50:14 AM By: Linton Ham MD Entered By: Linton Ham on 01/23/2021  08:33:01 -------------------------------------------------------------------------------- Physical Exam Details Patient Name: Date of Service: [name], [name] 01/21/2021 3:00 PM Medical Record Number: [id number] Patient Account Number: [id number] Date of Birth/Sex: Treating RN: [birth year] (65 y.o. Sue Lush Primary Care Provider: [name] Other Clinician: Referring Provider: Treating Provider/Extender: [name] in Treatment: 1 Constitutional Sitting or standing Blood Pressure is within target range for patient.. Pulse regular and within target range for patient.[name] Kitchen Respirations regular, non-labored and within target range.. Temperature is normal and within the target range for the patient.[name] Kitchen Appears in no distress. Notes wound exam; Her original wound on the left plantar heel which I don't think is ever closed still with necrotic circumference and I believe the wound probably extends up towards the insertion point of her Achilles although I did not debride this far. Removing callus and nonviable subcutaneous tissue and finally A. fib and S debris from the wound surface on the left On the right heel wound is similar and requires similar debridements. Neither wound has exposed bone however deep. No evidence of surrounding infection Electronic Signature(s) Signed: 01/24/2021 9:50:14 AM By: Linton Ham MD Entered By: Linton Ham on 01/23/2021 08:35:43 -------------------------------------------------------------------------------- Physician Orders Details Patient Name: Date of Service: [name], [name] 01/21/2021 3:00 PM Medical Record Number: [id number] Patient Account Number: [id number] Date of Birth/Sex: Treating RN: 1956-02-14 (65 y.o. 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 Bathing/ Shower/ Hygiene May shower and wash wound with soap and water. Off-Loading Open toe surgical shoe to: - Right Foot Heel suspension boot to: - Gloped Heel Offloader to left foot Additional Orders / Instructions Follow Nutritious Diet Wound Treatment Wound #10 - Foot Wound Laterality: Right, Lateral Cleanser: Soap and Water 1 x Per Day/15 Days Discharge Instructions: May shower and wash wound with dial antibacterial soap and water prior to dressing change. Prim Dressing: KerraCel Ag Gelling Fiber Dressing, 4x5 in (silver alginate) 1 x Per Day/15 Days ary Discharge Instructions: Apply silver alginate to wound bed as instructed Secondary Dressing: Woven Gauze Sponge, Non-Sterile 4x4 in 1 x Per Day/15 Days Discharge Instructions: Apply over primary dressing as directed. Secondary  Dressing: ABD Pad, 5x9 1 x Per Day/15 Days Discharge Instructions: Apply over primary dressing as directed. Secured With: [employer], 4.5x3.1 (in/yd) 1 x Per Day/15 Days Discharge Instructions: Secure with Kerlix as directed. Secured With: Transpore Surgical Tape, 2x10 (in/yd) 1 x Per Day/15 Days Discharge Instructions: Secure dressing with tape as directed. Wound #9 - Calcaneus Wound Laterality: Left Cleanser: Soap and Water 1 x Per FIE/33 Days Discharge Instructions: May shower and wash wound with dial antibacterial soap and water prior to dressing change. Prim Dressing: KerraCel Ag Gelling Fiber Dressing, 4x5 in (silver alginate) 1 x Per Day/15 Days ary Discharge Instructions: Apply silver  alginate to wound bed as instructed Secondary Dressing: Woven Gauze Sponge, Non-Sterile 4x4 in 1 x Per Day/15 Days Discharge Instructions: Apply over primary dressing as directed. Secondary Dressing: ABD Pad, 5x9 1 x Per Day/15 Days Discharge Instructions: Apply over primary dressing as directed. Secured With: [employer], 4.5x3.1 (in/yd) 1 x Per Day/15 Days Discharge Instructions: Secure with Kerlix as directed. Secured With: Transpore Surgical Tape, 2x10 (in/yd) 1 x Per Day/15 Days Discharge Instructions: Secure dressing with tape as directed. Radiology Magnetic Resonance Imaging (MRI)-Left Heel - CPT - (ICD10 [id number] - Non-pressure chronic ulcer of left heel and midfoot with necrosis of : muscle) Electronic Signature(s) Signed: 01/21/2021 6:16:52 PM By: Lorrin Jackson Signed: 01/24/2021 9:50:14 AM By: Linton Ham MD Previous Signature: 01/21/2021 3:44:47 PM Version By: Lorrin Jackson Entered By: Lorrin Jackson on 01/21/2021 16:43:33 Prescription 01/21/2021 -------------------------------------------------------------------------------- Osvaldo Shipper MD Patient Name: Provider: 02-28-1956 [id number] Date of Birth: NPI#Wanda Plump [id number] Sex: DEA #: [phone] Phone #: License #: [place] Patient Address: [address] [place], [place] [address] [place], [place] [address] [phone] Allergies mushroom; penicillin; rosemary; Shellfish Containing Products; bee venom protein (honey bee); Fish Containing Products; tomato, raw; aloe vera; acetaminophen; acyclovir; naproxen; doxycycline Provider's Orders Magnetic Resonance Imaging (MRI)-Left Heel - ICD10: L97.423 - CPT: Hand Signature: Date(s): Electronic Signature(s) Signed: 01/21/2021 6:16:52 PM By: Lorrin Jackson Signed: 01/24/2021 9:50:14 AM By: Linton Ham MD Entered By: Lorrin Jackson on 01/21/2021  16:43:34 -------------------------------------------------------------------------------- Problem List Details Patient Name: Date of Service: [name], [name] 01/21/2021 3:00 PM Medical Record Number: [id number] Patient Account Number: [id number] Date of Birth/Sex: Treating RN: 07-Nov-1955 (65 y.o. 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 01/21/2021 3:00 PM Medical Record Number: [id number] Patient Account Number: [id number] Date of Birth/Sex: Treating RN: 02-Mar-1956 (65 y.o. Sue Lush Primary Care Provider: [name] Other Clinician: Referring Provider: Treating Provider/Extender: [name] in Treatment: 1 Subjective History of Present Illness (HPI) ADMISSION 11/12/2018 This is a 65 year old woman with type 2  diabetes and diabetic neuropathy. She has been dealing with a left heel plantar wound for roughly 2 years. She states this started when she pulled some skin off the area and it  progressed into a wound. She also has an area on the tip of her left great toe for 1 year. She has been largely followed by Dr. Sharol Given and she had an excision of bone in the left heel in 2017. I do not see microbiology from this excision or pathology. Apparently this wound never really closed. She most recently has been using Silvadene cream. Offloading this with a scooter. The last MRI I see was in June 2018 which did not show osteomyelitis at that time. Apparently this wound is never really progressed towards healing. During her last review by Dr. Sharol Given in October it was recommended that she undergo a left BKA and she refused. She went to see a second orthopedic consult at [facility] who am recommended conservative wound care to see if this will close or progress towards closure but also warned that possible surgery may be necessary. An x-ray that was done at [facility] showed an irregular calcaneal body and calcaneal tuberosity. This is probably postprocedural. Previous x-rays at [facility] had suggested heterotrophic calcifications but I do not see this. Apparently a second ointment was added to the Silvadene which the patient thinks is because some improvement. She has not been systemically unwell. No fever or chills. She thinks the second ointment that was given to her at [facility] has helped somewhat. 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 ABIs done in June 2018 showed a ABI of 1.12 on the right and 1.1 on the left she was biphasic bilaterally. ABIs in our clinic were noncompressible today. 11/20/18 on evaluation today patient is seen for her second visit here in the office although this is actually the  first visit with me concerning an issue that she's having with her great toe and heel of the left foot. Fortunately she does not appear to be have any discomfort at this time and she does have a scooter in order to offload her foot she also has a boot for offloading. With that being said this is something that appears to have been going on for some time she was seeing Dr. due to his recommendation was that she was going to require a below knee amputation. With that being said she wanted to come to the wound center but according to the patient Dr. Sharol Given told her that "we could not help her". Nonetheless she saw another provider who suggested that it may be worth a shot for [name] to try and help her out if it all possible. Nonetheless she decided that it would be worth a trial since otherwise any the way she's gonna end up with an amputation. Obviously if we can heal the wound then that will not be the case. Again I explained to her that obviously there are no guarantees but we will give this a good try and attempts to get the wound to heal. 1/30; the patient continues to have areas on the left plantar heel and the left plantar great toe. The more worrisome area is the heel. She went  for MRI on Saturday but this could not be done because she had silver alginate in the wound bed. I would like to get this rebooked. If she does not have osteomyelitis she will need a total contact cast. We have been using silver alginate in the wounds 2/7; patient has wounds on her left plantar heel and left plantar great toe. The area on the heel has some depth although it looks about the same today. Her MRI will finally be done tomorrow. If she has osteomyelitis in the heel and then we will need to consider her for IV antibiotics and hyperbaric oxygen. If the MRI is negative she will need a total contact cast although she is wearing a cam boot and using a scooter at present. Will be using silver alginate. She will take it off  tomorrow in preparation for the MRI 2/14; the patient's MRI showed extensive surgical changes with a large portion of her calcaneus removed on the left secondary to her previous surgery by Dr. Sharol Given however there is no evidence of osteomyelitis. She would therefore be a candidate for a total contact cast and we applied this for the first time today 2/21; silver collagen total contact cast. The area on the plantar left great toe is "healed" still a lot of callus on this area. Dimensions on the plantar heel not too much different some epithelialization is present however. This is an improvement 12/25/18 on evaluation today patient actually is seen for follow-up concerning issues that she has been having with her cast she states she feels like it's wet and squishy in the bottom of her cast. The reason she does come in to have this evaluated and see what is going on. Fortunately there's no signs of infection at this time was the cast was removed. 3/6; the patient's area on the tip of her right great toe is callused but there is no open area here. She still has the fairly extensive area in the left heel. We have been using silver collagen in the wound. 01/08/19 on evaluation today patient actually appears to be doing very well in regard to her heel ulcer in my pinion to see if you shown signs of improvement which is excellent news. There's no evidence of infection. She continues to have quite a bit of drainage but fortunately again this doesn't seem to be hindering her healing. 3/20 -Patient's foot ulcer on the left appears to be doing well, the dimensions appear encouraging, we have been using total contact cast with Prisma and will continue doing that 3/27; patient continues to make nice progress on the left plantar heel. She is using silver collagen under a total contact cast. It is been a while since I have seen this wound and it really looks a lot better. Smaller with healthy granulation 4/3; she  continues to make nice progress on the left plantar heel. Using silver collagen under a total contact cast 4/10; left plantar heel. Again skin over the surface of the wound with not much in the way of adherence. This results in undermining. Using silver collagen changed to silver alginate 4/17 left plantar heel. Again not much improvement. There is no undermining today no debridement was required we used silver alginate last time because of excessive moisture 4/24 left plantar heel. Again not as much improvement as I would have liked. About 3 mm of depth. Thick callused skin around the circumference. Using silver alginate 5/1; left plantar heel this is improved this week. Less depth epithelialization  is present. Using silver alginate alginate under a total contact cast 5/8; left plantar heel dimensions are about the same. The depth appears to be improved. I use silver collagen starting today under the cast 5/15 left plantar heel. Arrives today with a 2-day history of feeling like something had "slipped" while walking her dog in the cast. Unfortunately extensive area relatively to the small wound of undermining superiorly denuded epithelium. 5/22-Patient returns at 1 week after being taken off the TCC on account of some fluid collection that was debrided and cultured at last visit from the plantar ulcer on the left heel. The culture results are polymicrobial with Klebsiella, enterococcus, Proteus growth these organisms are sensitive to Cipro except with enterococcus which is sensitive to ampicillin but patient is highly allergic to penicillin according to her. 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 5/29; the patient arrived last week with a new wound on her left plantar great toe. With regards to the culture that I did 2 weeks ago of her deteriorating heel wound this grew Klebsiella and enterococcus. She was  given Cipro however the enterococcus would not be covered well by a quinolone. She is allergic to penicillin. I will give her linezolid 600 twice daily x5 days 6/12; the patient continues to have a wound on her left and after she returns to the beach there is undermining laterally. She has the new wound from this 2 weeks or so ago on the plantar tip of her left great toe. She had a fall today scraping the dorsal surface of the left fifth 7/14; READMISSION since the patient was last here she was hospitalized from 04/15/2019 through 04/22/2019. She had presented to an outside ER after falling and hitting her elbow. She had had previous surgery on the elbow and fractures several years ago. An x-ray was negative and she was sent home. She is readmitted with sepsis and acute renal failure secondary to septic arthritis of the elbow. Cultures apparently showed pansensitive staph aureus however she has a severe beta-lactam allergy. She was treated with vancomycin. Apparently the vancomycin is completed and her PICC line is removed although she is seeing Dr. Megan Salon tomorrow due to continued pain and swelling. In the hospital she had an IandD by Dr. [name] of orthopedics. The original surgery was on 04/17/2019 and it was felt that she had septic arthritis. She is still having a lot of pain and swelling in the right elbow and apparently is due to see Dr. Megan Salon tomorrow and what she thinks is that she will be restarted on antibiotics. With regards to her heel wounds/left foot wounds. Her arterial studies were checked and her ABIs were within normal limits. X-ray showed plantar foot ulcer negative for osteomyelitis postop resection of the posterior calcaneus. She has been using silver alginate to the heel 8/4-Patient returns after being seen on 7/14, we are using silver alginate to the calcaneal wound, she is continuing to receive care for her right elbow septic arthritis 8/14- Patient returns after 1 week,  she is being seen by the surgical group for her right elbow, she is here for the left calcaneal wound for which we are using silver alginate this is about the same 8/21; this is a patient I readmitted to the clinic 5 weeks ago. She is continuing to have difficulties with a septic arthritis of the right elbow she had after a fall and apparently has  had surgical IandD's since the last time we saw her. She is also following with Dr. Megan Salon of infectious disease and is apparently on 3 oral antibiotics although at the time of this dictation I am not sure what they are. She comes in today with a necrotic surface on the left great toe with a blister laterally. This was still clearly an open wound. She had purulent drainage coming out of this. The original wound on the plantar aspect of her right heel in the setting of a Charcot foot is deep not open to bone but certainly not any better at all. We have been using silver alginate 8/28; dealing with septic arthritis of the right elbow. May need to go for further surgery here in the second week of September. She had a blister on the left great toe that was purulent [place] draining last week although a culture did not grow anything [already on antibiotics through infectious disease for her elbow]. The punched-out area on her heel is just like it was when she first came in. This almost closed with a total contact cast there are no options for that now. The patient states she cannot stay off her foot having to do housework X-ray of the foot showed a moderate bunion and severe degenerative changes at the first metatarsal phalangeal joint there was a moderate plantar calcaneal spur. We will need to check the location of this. 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 It is oral. We are using silver collagen to the  punched-out area on her left heel and to the summer superficial area of the left great toe. She is offloading this is best she can although judging by the amount of callus it is not enough. She thinks she has enough strength in her right elbow now to use her scooter. 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 Hale Bogus notes and infectious disease. We are using silver collagen to the punched-out areas on her left heel and to the plantar aspect of her left great toe she is now using her scooter to get around and to help offload these areas 10/2; 2-week follow-up. Still on clindamycin I believe for the left elbow she is going for an MRI of the elbow over the weekend. She is then going to see orthopedics and infectious disease. The area on the left heel is certainly no better. This does not probe to bone however there is green drainage. She has an area on the tip of her toe. The area on the heel is in a wound we almost closed at one point with total contact casting. 10/9; one-week follow-up. Culture I did of the heel last week which was a swab culture admittedly showed moderate Enterococcus faecalis moderate Pseudomonas. The wound itself looks about the same. There is no palpable bone although the depth of it closely approximates bone. The heel is swollen but not erythematous. Her MRI is booked for next week. She also has an MRI of the right elbow. I've also lifted the last consult from Dr. Megan Salon who is following her for septic arthritis of the right elbow. I did not see him  specifically comment on her left heel however the patient states that he is aware of this. I did not specifically address the organisms I cultured last week because of the possible effect of any additional cultures that will be done on the elbow.  Additionally these were superficial not bone cultures 10/16; her MRI of the heel was put back to next week. She did have her elbow done. I have been in contact with Dr. Megan Salon about my concerns about osteomyelitis of the left heel. We have been using collagen. She also has a wound on the plantar tip of her first toe 10/23; her MRI of the heel was negative for osteomyelitis but suggested a small abscess. She also had an MRI of her elbow that showed findings compatible with a septic joint. She went to see Dr. Megan Salon and she is going to have both oral and IV antibiotics which I am sure will cover any infection in the heel. I did not actually see his note today. We are using silver collagen offloading the heel with a heel offloading boot 11/6; patient continues with silver collagen to both wound areas on her plantar left heel and plantar left great toe. She remains on daptomycin by Dr. Megan Salon of infectious disease. She complains of diarrhea. Dr. Megan Salon is aware of this. 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 she has been put on oral vancomycin I think a lot of high clinical suspicion for pseudomembranous colitis Because of the right elbow there are no options to completely off her left foot beyond the heel offloading boot we have now. Fortunately the left heel ulcer itself has remained static some improvement in the plantar left great toe 11/20; patient arrives for review of her left heel ulcer. I also note she has a difficult time with septic arthritis of the right elbow. She currently is on IV daptomycin which seems to have helped the drainage in her elbow [MSSA]. Because of high concern for pseudomembranous colitis she was also put on vancomycin orally and Flagyl orally. She was on Levaquin but that was discontinued because of the concern about C.  difficile. She states her diarrhea is better. She arrives in clinic today with a large area of denuded skin on the lateral part of the heel. This had totally separated. We have been using silver alginate to the wound areas. She arrived in a scooter telling me she is offloading the foot is much as possible. 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 Dr. Megan Salon stopped the daptomycin and now has her on vancomycin 1000 mg IV daily. This is supposed to go onto 1/18. She is being referred to [place] for what sounds like a elbow replacement type operation for her MSSA septic arthritis. She arrives in clinic today with a blister on the medial part of the wound on her heel. She still has a lot of callus around the heel although the surface of the wound on the left heel looks somewhat better. READMISSION 03/19/2020 Mrs. Bevel is a 65 year old woman we have followed for almost all of 2020 with a neuropathic wound on her left heel and the tip of her left great toe.. We she had previously had  a partial calcanectomy by Dr. Sharol Given because of underlying osteomyelitis. She also had a history of MRSA even when we admitted her to the clinic. An MRI when she first came into our clinic did not show osteomyelitis. We put her in a total contact cast and gradually this contracted to the point it was almost closed however in that same timeframe she had a fall. 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 We had to stop putting her in a cast partially because of infection and partially because she could not support herself with the elbow. Things went progressively downhill. When we last saw her at the end of December she had a large open wound on the left heel. She had had recurrent infections in the  right elbow. It was felt that either the heel lift: Ice her elbow or vice versa. We never had MRSA I do not believe cultured in the left heel however. 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 She underwent an operative debridement and a T11-L3 fusion. An operative culture grew Pseudomonas. She was treated with cefepime and required readmission to the hospital with acute kidney failure requiring temporary dialysis. She was discharged to rehab I believe she signed herself out [place]. He has been following with her primary doctor and she comes in the clinic today with miraculously the left heel totally closed. She is followed up with Dr. Megan Salon of infectious disease he does not feel she has any evidence of infection in her elbow or her heel for that matter and the heel is actually fully epithelialized. She is on ciprofloxacin 500 twice daily I think mostly directed at Pseudomonas. The patient is convinced that it was the treatment of the Pseudomonas in her back that ultimately led to closure of her heel and that certainly possible or could have been because she was nonambulatory in the hospital for 3 months according to her. She is still using her heel offloading boot and she says she is "learning to walk again" Readmission: 06/09/2020 patient presents today for reevaluation here in the clinic following a reopening of her heel ulcer. She tells [name] that this actually occurred about 2 weeks after she was last seen in May of this year though it is now the beginning of August and she has not come back until now to see [name]. Nonetheless she does have an open wound there is been no x-rays at this time infectious disease has been monitoring him following this up until this point. They made the recommendation that she come to see [name]. Fortunately there is no signs of active infection systemically though she does tell me that she had Pseudomonas  in the spine when she had her back surgery she is currently on antibiotics for that. 8/20; this is a patient who I saw 1 time in late May. Apparently shortly after she was here the area on her plantar left heel started to drain again and she had an open wound. She has been going to her primary physician and has been following with Dr. Megan Salon. 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 She is currently on 3 times a day prophylactic ciprofloxacin. She is admitted to the clinic last week for review of the wound on the left heel. Notable for the fact that she had a  partial calcanectomy by Dr. Sharol Given 3 or 4 years ago. She says she is using her motorized wheelchair again as an off loader but she came in in regular shoes. 9/2; 2-week follow-up. Plantar left heel. In spite of debridement last week she had thick very adherent callus around the circumference of the wound and a nonviable gray surface. We have been using Hydrofera Blue. We gave her a heel offloading boot last week. She says she is off her foot except for she needs to walk her dog. 9/17; 2 week follow-up. Lives with a heel ulcer in roughly the same condition as a month ago. Though requiring debridement and revision using Hydrofera Blue. She says she is often except for times that she needs to walk her dog and she claims to be using the heel offloading shoe. She has a new injury which she says was a burn on the medial part of her fifth metacarpophalangeal on the right. 10/15; almost 1 month follow-up. She arrives with the plantar left heel ulcer and not a very good condition. She has thick callus surrounding the wound and a necrotic base over the top of this. I am not sure anybody is paying close attention to this. She says she walks minimally but I highly doubt this. She is supposed to be using Hydrofera Blue. She says she has plenty of supplies. She  tells me that she is traveling to [place] next week for a 10-day trip. We will see her back after that She has both been approved for Dermagraft however I am only willing to attempt this if she is going to agree to a total contact cast. I do not think there is any point in applying this advance dressing when she is walking on this excessively READMISSION 01/10/2021 The patient was last here in October at which time we were dealing with a very difficult wound on the left plantar heel. It was not in very good condition at the time. She tells me she went to [place] on vacation. Shortly after she arrived back her husband was in an accident and then after that she had congestive heart failure secondary to her advanced chronic renal failure. I thought she told me she was admitted to hospital but I certainly do not see this at [facility]. In any case while she was markedly fluid overloaded she developed an open area on the right lateral heel to go along with the original wound on the left. Both of these completely covered with necrotic surface. I do not know that she is been dressing these at all. She has a heel offloading boot on the left and an ordinary shoe on the right. Before she left last time I did an MRI of the left heel that did not show distinctly osteomyelitis. She was approved for Dermagraft but the wounds are certainly not in a state for application of an advanced treatment product at this point Besides this her past medical history is largely unchanged. 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 Culture at that point showed Pseudomonas. She is not currently on antibiotics. She was  on suppressive ciprofloxacin for the spine osteomyelitis but she says this was stopped by  Dr. Megan Salon. Her primary doctor recently gave her a course of doxycycline but she could not tolerate it because of nausea and a rash ABIs in our clinic were 1.13 on the right and 1.15 on the left 3/25; patient comes back to see [name] after re-presenting last week with large ulcers on her bilateral heels. 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 She also has chronic renal failure. 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 She presents spent a prolonged period of time on antibiotics. Culture of the lumbar area I believes had Pseudomonas whereas peripheral cultures of the elbow showed MRSA. She presented last week with deep ulcers on her bilateral plantar heel and the lateral part of her right heel. She has never offloaded this properly. PLAIN X-rays of both heels did not show osteomyelitisin either heel.we've been using silver alginate while we work through the possibilities of coexistent infection/osteomyelitis. She is wearing bilateral heel offloading boots although I've never been certain about the adequacy of her offloading. She also has a scooter Objective Constitutional Sitting or standing Blood Pressure is within target range for patient.. Pulse regular and within target range for patient.[name] Kitchen Respirations regular, non-labored and within target range.. Temperature is normal and within the target range for the patient.[name] Kitchen Appears in no distress. Vitals Time Taken: 4:00 PM, Height: 61 in, Weight: 162 lbs, BMI: 30.6, Temperature: 97.9 F, Pulse: 85 bpm, Respiratory Rate: 16 breaths/min, Blood Pressure: 102/64 mmHg, Capillary Blood Glucose: 109 mg/dl. General Notes: wound exam; ooHer original wound on the left plantar heel which I don't think is  ever closed still with necrotic circumference and I believe the wound probably extends up towards the insertion point of her Achilles although I did not debride this far. Removing callus and nonviable subcutaneous tissue and finally A. fib and S debris from the wound surface on the left ooOn the right heel wound is similar and requires similar debridements. ooNeither wound has exposed bone however deep. No evidence of surrounding infection Integumentary (Hair, Skin) Wound #10 status is Open. Original cause of wound was Gradually Appeared. The date acquired was: 09/29/2020. The wound has been in treatment 1 weeks. The wound is located on the Right,Lateral Foot. The wound measures 3cm length x 4.5cm width x 0.5cm depth; 10.603cm^2 area and 5.301cm^3 volume. There is Fat Layer (Subcutaneous Tissue) exposed. There is no tunneling or undermining noted. There is a medium amount of serosanguineous drainage noted. The wound margin is distinct with the outline attached to the wound base. There is large (67-100%) red, pink granulation within the wound bed. There is a small (1- 33%) amount of necrotic tissue within the wound bed including Adherent Slough. General Notes: callous periowund. Wound #9 status is Open. Original cause of wound was Gradually Appeared. The date acquired was: 01/11/2019. The wound has been in treatment 1 weeks. The wound is located on the Left Calcaneus. The wound measures 4.7cm length x 4.2cm width x 0.9cm depth; 15.504cm^2 area and 13.953cm^3 volume. There is Fat Layer (Subcutaneous Tissue) exposed. There is no tunneling or undermining noted. There is a medium amount of serosanguineous drainage noted. The wound margin is distinct with the outline attached to the wound base. There is large (67-100%) red, pink granulation within the wound bed. There is a small (1- 33%) amount of necrotic tissue within the wound bed including Adherent Slough. General  Notes: callous  periwound. 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 There was a Excisional Skin/Subcutaneous Tissue Debridement with a total area of 13.5 sq cm performed by Ricard Dillon., MD. With the following instrument(s): Curette to remove Non-Viable tissue/material. Material removed includes Callus and Subcutaneous Tissue and after achieving pain control using Other (Benzocaine). No specimens were taken. A time out was conducted at 16:25, prior to the start of the procedure. A Moderate amount of bleeding was controlled with Silver Nitrate. The procedure was tolerated well. Post Debridement Measurements: 3cm length x 4.5cm width x 0.5cm depth; 5.301cm^3 volume. Character of Wound/Ulcer Post Debridement is stable. Severity of Tissue Post Debridement is: Fat layer exposed. Post procedure Diagnosis Wound #10: Same as Pre-Procedure Wound #9 Pre-procedure diagnosis of Wound #9 is a Diabetic Wound/Ulcer of the Lower Extremity located on the Left Calcaneus .Severity of Tissue Pre Debridement is: Fat layer exposed. There was a Excisional Skin/Subcutaneous Tissue Debridement with a total area of 19.74 sq cm performed by Ricard Dillon., MD. With the following instrument(s): Curette to remove Non-Viable tissue/material. Material removed includes Callus and Subcutaneous Tissue and after achieving pain control using Other (Benzocaine). No specimens were taken. A time out was conducted at 16:25, prior to the start of the procedure. A Moderate amount of bleeding was controlled  with Pressure. The procedure was tolerated well. Post Debridement Measurements: 4.7cm length x 4.2cm width x 0.9cm depth; 13.953cm^3 volume. Character of Wound/Ulcer Post Debridement is stable. Severity of Tissue Post Debridement is: Fat layer exposed. Post procedure Diagnosis Wound #9: Same as Pre-Procedure Plan Follow-up Appointments: Return Appointment in 1 week. Bathing/ Shower/ Hygiene: May shower and wash wound with soap and water. Off-Loading: Open toe surgical shoe to: - Right Foot Heel suspension boot to: - Gloped Heel Offloader to left foot Additional Orders / Instructions: Follow Nutritious Diet Radiology ordered were: Magnetic Resonance Imaging (MRI)-Left Heel - CPT: WOUND #10: - Foot Wound Laterality: Right, Lateral Cleanser: Soap and Water 1 x Per ULA/45 Days Discharge Instructions: May shower and wash wound with dial antibacterial soap and water prior to dressing change. Prim Dressing: KerraCel Ag Gelling Fiber Dressing, 4x5 in (silver alginate) 1 x Per Day/15 Days ary Discharge Instructions: Apply silver alginate to wound bed as instructed Secondary Dressing: Woven Gauze Sponge, Non-Sterile 4x4 in 1 x Per Day/15 Days Discharge Instructions: Apply over primary dressing as directed. Secondary Dressing: ABD Pad, 5x9 1 x Per Day/15 Days Discharge Instructions: Apply over primary dressing as directed. Secured With: [employer], 4.5x3.1 (in/yd) 1 x Per Day/15 Days Discharge Instructions: Secure with Kerlix as directed. Secured With: [employer] T ape, 2x10 (in/yd) 1 x Per Day/15 Days Discharge Instructions: Secure dressing with tape as directed. WOUND #9: - Calcaneus Wound Laterality: Left Cleanser: Soap and Water 1 x Per XMI/68 Days Discharge Instructions: May shower and wash wound with dial antibacterial soap and water prior to dressing change. Prim Dressing: KerraCel Ag Gelling Fiber Dressing, 4x5 in (silver alginate) 1 x Per Day/15 Days ary Discharge  Instructions: Apply silver alginate to wound bed as instructed Secondary Dressing: Woven Gauze Sponge, Non-Sterile  4x4 in 1 x Per Day/15 Days Discharge Instructions: Apply over primary dressing as directed. Secondary Dressing: ABD Pad, 5x9 1 x Per Day/15 Days Discharge Instructions: Apply over primary dressing as directed. Secured With: [employer], 4.5x3.1 (in/yd) 1 x Per Day/15 Days Discharge Instructions: Secure with Kerlix as directed. Secured With: [employer] T ape, 2x10 (in/yd) 1 x Per Day/15 Days Discharge Instructions: Secure dressing with tape as directed. #1 I'm going to repeat the MRI of the left heel. This is been her chronic wound area and was present during the problem with her elbow. Probably MRSA. I don't know that her previous MRI showed osteomyelitis not checked this. 2 if there is not active Infection in the left area she'll need to go back into a total contact cast. When she first came here we almost closed as using a total contact cast. Although she has always claims she is off this I don't know that she never really has been off to the point that this would actually progress towards healing #3 I did not MRI of the right heel although I'll reserve this for later if necessary. Electronic Signature(s) Signed: 01/24/2021 9:50:14 AM By: Linton Ham MD Entered By: Linton Ham on 01/23/2021 08:42:17 -------------------------------------------------------------------------------- SuperBill Details Patient Name: Date of Service: [name], [name] 01/21/2021 Medical Record Number: [id number] Patient Account Number: [id number] Date of Birth/Sex: Treating RN: 1956/10/22 (65 y.o. 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 E11.42 Type 2 diabetes mellitus with diabetic polyneuropathy Facility Procedures CPT4 Code: [id number] Description: 33612 - DEB SUBQ TISSUE 20 SQ CM/< ICD-10 Diagnosis Description E11.621 Type 2 diabetes mellitus with foot ulcer L97.423 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 L97.514 Non-pressure chronic ulcer of other  part of right foot with necrosis of bone Modifier: Quantity: 1 CPT4 Code: [id number] E L L Description: 05110 - DEB SUBQ TISS EA ADDL 20CM ICD-10 Diagnosis Description E11.621 Type 2 diabetes mellitus with foot ulcer 11.621 Type 2 diabetes mellitus with foot ulcer 97.423 Non-pressure chronic ulcer of left heel and midfoot with necrosis of  muscle 97.514 Non-pressure chronic ulcer of other part of right foot with necrosis of bone Modifier: Quantity: 1 Physician Procedures : CPT4 Code Description Modifier [id number] - WC PHYS SUBQ TISS 20 SQ CM ICD-10 Diagnosis Description E11.621 Type 2 diabetes mellitus with foot ulcer L97.423 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

## 2021-01-25 ENCOUNTER — Inpatient Hospital Stay: Payer: PRIVATE HEALTH INSURANCE

## 2021-01-25 ENCOUNTER — Inpatient Hospital Stay: Payer: PRIVATE HEALTH INSURANCE | Attending: Hematology & Oncology | Admitting: Family

## 2021-01-25 ENCOUNTER — Other Ambulatory Visit: Payer: Self-pay

## 2021-01-25 ENCOUNTER — Telehealth: Payer: Self-pay | Admitting: *Deleted

## 2021-01-25 ENCOUNTER — Other Ambulatory Visit: Payer: Self-pay | Admitting: Family Medicine

## 2021-01-25 ENCOUNTER — Encounter: Payer: Self-pay | Admitting: Family

## 2021-01-25 ENCOUNTER — Other Ambulatory Visit: Payer: Self-pay | Admitting: *Deleted

## 2021-01-25 ENCOUNTER — Encounter: Admit: 2021-01-25 | Discharge: 2021-01-25 | Payer: MEDICARE

## 2021-01-25 ENCOUNTER — Ambulatory Visit: Admit: 2021-01-25 | Discharge: 2021-01-25 | Payer: MEDICARE

## 2021-01-25 VITALS — BP 186/77 | HR 75 | Temp 98.0°F | Resp 18 | Ht 65.0 in | Wt 165.0 lb

## 2021-01-25 DIAGNOSIS — D508 Other iron deficiency anemias: Secondary | ICD-10-CM

## 2021-01-25 DIAGNOSIS — N189 Chronic kidney disease, unspecified: Secondary | ICD-10-CM

## 2021-01-25 DIAGNOSIS — I82503 Chronic embolism and thrombosis of unspecified deep veins of lower extremity, bilateral: Secondary | ICD-10-CM

## 2021-01-25 DIAGNOSIS — D6859 Other primary thrombophilia: Secondary | ICD-10-CM | POA: Diagnosis not present

## 2021-01-25 DIAGNOSIS — R7983 Abnormal findings of blood amino-acid level: Secondary | ICD-10-CM

## 2021-01-25 DIAGNOSIS — Z7901 Long term (current) use of anticoagulants: Secondary | ICD-10-CM | POA: Diagnosis not present

## 2021-01-25 DIAGNOSIS — E7211 Homocystinuria: Secondary | ICD-10-CM

## 2021-01-25 DIAGNOSIS — Z86718 Personal history of other venous thrombosis and embolism: Secondary | ICD-10-CM | POA: Insufficient documentation

## 2021-01-25 DIAGNOSIS — D509 Iron deficiency anemia, unspecified: Secondary | ICD-10-CM | POA: Diagnosis not present

## 2021-01-25 DIAGNOSIS — D500 Iron deficiency anemia secondary to blood loss (chronic): Secondary | ICD-10-CM

## 2021-01-25 DIAGNOSIS — Z79899 Other long term (current) drug therapy: Secondary | ICD-10-CM | POA: Diagnosis not present

## 2021-01-25 DIAGNOSIS — N183 Stage 3 chronic kidney disease, unspecified whether stage 3a or 3b CKD (HCC): Secondary | ICD-10-CM

## 2021-01-25 LAB — BASIC METABOLIC PANEL
Lab: 1.8 mg/dL — ABNORMAL HIGH (ref 0.4–1.00)
Lab: 12 K/UL — ABNORMAL LOW (ref 3–12)
Lab: 140 MMOL/L — ABNORMAL HIGH (ref 137–147)
Lab: 25 mg/dL (ref 7–25)
Lab: 4.4 MMOL/L (ref 3.5–5.1)
Lab: 94 mg/dL — ABNORMAL LOW (ref 70–100)

## 2021-01-25 LAB — CBC AND DIFF
Lab: 0 K/UL (ref 0–0.20)
Lab: 0.2 K/UL (ref 0–0.45)
Lab: 0.5 K/UL (ref 0–0.80)
Lab: 1 % (ref 0–2)
Lab: 14 % — ABNORMAL LOW (ref >60)
Lab: 2 K/UL (ref 1.0–4.8)
Lab: 26 % (ref 24–44)
Lab: 3 % (ref 0–5)
Lab: 3.9 M/UL — ABNORMAL LOW (ref 4.0–5.0)
Lab: 305 K/UL (ref 150–400)
Lab: 33 g/dL (ref 32.0–36.0)
Lab: 5 K/UL (ref 1.8–7.0)
Lab: 63 % (ref 41–77)
Lab: 7 % (ref 4–12)
Lab: 8 K/UL (ref 4.5–11.0)
Lab: 8.6 FL (ref 7–11)

## 2021-01-25 LAB — CBC WITH DIFFERENTIAL (CANCER CENTER ONLY)
Abs Immature Granulocytes: 0.03 10*3/uL (ref 0.00–0.07)
Basophils Absolute: 0 10*3/uL (ref 0.0–0.1)
Basophils Relative: 1 %
Eosinophils Absolute: 0.2 10*3/uL (ref 0.0–0.5)
Eosinophils Relative: 4 %
HCT: 29.2 % — ABNORMAL LOW (ref 36.0–46.0)
Hemoglobin: 9 g/dL — ABNORMAL LOW (ref 12.0–15.0)
Immature Granulocytes: 1 %
Lymphocytes Relative: 18 %
Lymphs Abs: 1 10*3/uL (ref 0.7–4.0)
MCH: 27.8 pg (ref 26.0–34.0)
MCHC: 30.8 g/dL (ref 30.0–36.0)
MCV: 90.1 fL (ref 80.0–100.0)
Monocytes Absolute: 0.4 10*3/uL (ref 0.1–1.0)
Monocytes Relative: 8 %
Neutro Abs: 3.8 10*3/uL (ref 1.7–7.7)
Neutrophils Relative %: 68 %
Platelet Count: 158 10*3/uL (ref 150–400)
RBC: 3.24 MIL/uL — ABNORMAL LOW (ref 3.87–5.11)
RDW: 15.7 % — ABNORMAL HIGH (ref 11.5–15.5)
WBC Count: 5.5 10*3/uL (ref 4.0–10.5)
nRBC: 0 % (ref 0.0–0.2)

## 2021-01-25 LAB — CMP (CANCER CENTER ONLY)
ALT: 12 U/L (ref 0–44)
AST: 17 U/L (ref 15–41)
Albumin: 3.1 g/dL — ABNORMAL LOW (ref 3.5–5.0)
Alkaline Phosphatase: 87 U/L (ref 38–126)
Anion gap: 6 (ref 5–15)
BUN: 44 mg/dL — ABNORMAL HIGH (ref 8–23)
CO2: 25 mmol/L (ref 22–32)
Calcium: 8.8 mg/dL — ABNORMAL LOW (ref 8.9–10.3)
Chloride: 104 mmol/L (ref 98–111)
Creatinine: 3.22 mg/dL (ref 0.44–1.00)
GFR, Estimated: 15 mL/min — ABNORMAL LOW (ref >60)
Glucose, Bld: 118 mg/dL — ABNORMAL HIGH (ref 70–99)
Potassium: 5.6 mmol/L — ABNORMAL HIGH (ref 3.5–5.1)
Sodium: 135 mmol/L (ref 135–145)
Total Bilirubin: 0.3 mg/dL (ref 0.3–1.2)
Total Protein: 8.2 g/dL — ABNORMAL HIGH (ref 6.5–8.1)

## 2021-01-25 LAB — RETICULOCYTES
Immature Retic Fract: 14.6 % (ref 2.3–15.9)
RBC.: 3.2 MIL/uL — ABNORMAL LOW (ref 3.87–5.11)
Retic Count, Absolute: 81.9 10*3/uL (ref 19.0–186.0)
Retic Ct Pct: 2.6 % (ref 0.4–3.1)

## 2021-01-25 LAB — D-DIMER, QUANTITATIVE: D-Dimer, Quant: 1.66 ug/mL-FEU — ABNORMAL HIGH (ref 0.00–0.50)

## 2021-01-25 MED ORDER — SODIUM CHLORIDE 0.9 % IV SOLN
Freq: Once | INTRAVENOUS | Status: AC
  Filled 2021-01-25: qty 250

## 2021-01-25 MED ORDER — FOLIC ACID 1 MG PO TABS: 2.0000 mg | ORAL_TABLET | Freq: Every day | ORAL | 6 refills | Status: DC

## 2021-01-25 MED ORDER — SODIUM CHLORIDE 0.9 % IV SOLN
200.0000 mg | Freq: Once | INTRAVENOUS | Status: AC
  Administered 2021-01-25: 200 mg via INTRAVENOUS
  Filled 2021-01-25: qty 200

## 2021-01-25 NOTE — Progress Notes (Addendum)
Hematology and Oncology Follow Up Visit  Deni Berti [id number] [birth year] 65 y.o. 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 Callins is here today for follow-up. She is feeling fatigued. She states that she still has sores on both feet and is going to the wound care clinical weekly. She has on support boots today and uses a Rolator at home when ambulating for support.  No falls or syncope to report.  She notes some minimal blood loss with the wound on her left foot. No other blood loss noted.  No abnormal bruising, no petechiae noted.  Hgb is 9.0, MCV 90, platelets 158 and WBC count 5.5.  She has some nausea and dizziness at times with headaches.  No fever, chills, cough, rash, SOB, chest pain, palpitations, abdominal pain or changes in bowel or bladder habits.  Her appetite is ok. She is doing her best to stay well hydrated.  ECOG Performance Status: 1 - Symptomatic but completely ambulatory  Medications:  Allergies as of 01/25/2021      Reactions   Bee Pollen Anaphylaxis   Fish-derived Products Hives, Shortness Of Breath, Swelling, Rash   Hives get in throat causing trouble breathing   Mushroom Extract Complex Anaphylaxis   Penicillins Anaphylaxis   **Tolerated cefepime March 2021 Did it involve swelling of the face/tongue/throat, SOB, or low BP? Yes Did it involve sudden or severe rash/hives, skin peeling, or any reaction on the inside of your mouth or nose? No Did you need to seek medical attention at a hospital or doctor's office? Yes When did it last happen?A few months ago If all above answers are "NO", may proceed with cephalosporin use.   Rosemary Oil Anaphylaxis   Shellfish Allergy Hives, Shortness Of Breath, Swelling, Rash    Tomato Hives, Shortness Of Breath   Hives in throat causes her trouble breathing   Acetaminophen Other (See Comments)   GI upset   Aloe Vera Hives   Broccoli [brassica Oleracea] Hives   Acyclovir And Related Other (See Comments)   Unknown reaction   Naproxen Other (See Comments)   Unknown reaction      Medication List       Accurate as of January 25, 2021  2:23 PM. If you have any questions, ask your nurse or doctor.        STOP taking these medications   doxycycline 100 MG tablet Commonly known as: VIBRA-TABS Stopped by: Laverna Peace, NP     TAKE these medications   acetaminophen 325 MG tablet Commonly known as: TYLENOL Take 2 tablets (650 mg total) by mouth every 6 (six) hours as needed for fever.   atorvastatin 10 MG tablet Commonly known as: LIPITOR Take 1 tablet by mouth once daily   Ciclopirox 1 % shampoo Massage in scalp and let sit 3 min and rinse 2 x weekly   diazepam 5 MG tablet Commonly known as: VALIUM Take 1 tablet (5 mg total) by mouth every 12 (twelve) hours as needed for anxiety.   DULoxetine 60 MG capsule Commonly known as: Cymbalta Take 1 capsule (60 mg total) by mouth daily.   folic acid 1 MG tablet Commonly known as: FOLVITE Take 2 tablets (2 mg total) by mouth daily.   FreeStyle Libre 14 Day  Reader Kerrin Mo 1 Device by Does not apply route daily.   FreeStyle Libre 14 Day Sensor Misc 1 Device by Does not apply route daily.   furosemide 20 MG tablet Commonly known as: LASIX Take 20 mg by mouth daily.   HumaLOG KwikPen 200 UNIT/ML KwikPen Generic drug: insulin lispro 15 units sub-q twice daily   HYDROcodone-acetaminophen 5-325 MG tablet Commonly known as: NORCO/VICODIN Take 1 tablet by mouth every 6 (six) hours as needed for moderate pain.   Lantus SoloStar 100 UNIT/ML Solostar Pen Generic drug: insulin glargine Injection 20 units in the morning and 40 units at night   lidocaine 5 % Commonly known as: Lidoderm Place 1 patch onto  the skin daily. Remove & Discard patch within 12 hours or as directed by MD   multivitamin Chew chewable tablet Chew 1 tablet by mouth daily.   multivitamin with minerals Tabs tablet Take 1 tablet by mouth daily.   naftifine 1 % cream Commonly known as: NAFTIN Apply topically daily.   nystatin powder Generic drug: nystatin APPLY  POWDER TOPICALLY THREE TIMES DAILY   pregabalin 75 MG capsule Commonly known as: LYRICA Take 1 capsule (75 mg total) by mouth 3 (three) times daily.   Xarelto 2.5 MG Tabs tablet Generic drug: rivaroxaban Take by mouth.       Allergies:  Allergies  Allergen Reactions  . Bee Pollen Anaphylaxis  . Fish-Derived Products Hives, Shortness Of Breath, Swelling and Rash    Hives get in throat causing trouble breathing  . Mushroom Extract Complex Anaphylaxis  . Penicillins Anaphylaxis    **Tolerated cefepime March 2021 Did it involve swelling of the face/tongue/throat, SOB, or low BP? Yes Did it involve sudden or severe rash/hives, skin peeling, or any reaction on the inside of your mouth or nose? No Did you need to seek medical attention at a hospital or doctor's office? Yes When did it last happen?A few months ago If all above answers are "NO", may proceed with cephalosporin use.  [name] Kitchen Rosemary Oil Anaphylaxis  . Shellfish Allergy Hives, Shortness Of Breath, Swelling and Rash  . Tomato Hives and Shortness Of Breath    Hives in throat causes her trouble breathing  . Acetaminophen Other (See Comments)    GI upset  . Aloe Vera Hives  . Broccoli [Brassica Oleracea] Hives  . Acyclovir And Related Other (See Comments)    Unknown reaction  . Naproxen Other (See Comments)    Unknown reaction    Past Medical History, Surgical history, Social history, and Family History were reviewed and updated.  Review of Systems: All other 10 point review of systems is negative.   Physical Exam:  height is 5' 5"  (1.651 m) and weight is 165 lb (74.8 kg). Her oral  temperature is 98 F (36.7 C). Her blood pressure is 186/77 (abnormal) and her pulse is 75. Her respiration is 18 and oxygen saturation is 100%.   Wt Readings from Last 3 Encounters:  01/25/21 165 lb (74.8 kg)  11/23/20 169 lb (76.7 kg)  11/23/20 169 lb (76.7 kg)    Ocular: Sclerae unicteric, pupils equal, round and reactive to light Ear-nose-throat: Oropharynx clear, dentition fair Lymphatic: No cervical or supraclavicular adenopathy Lungs no rales or rhonchi, good excursion bilaterally Heart regular rate and rhythm, no murmur appreciated Abd soft, nontender, positive bowel sounds MSK no focal spinal tenderness, no joint edema Neuro: non-focal, well-oriented, appropriate affect Breasts: Deferred    Lab Results  Component Value Date   WBC 5.5 01/25/2021  HGB 9.0 (L) 01/25/2021   HCT 29.2 (L) 01/25/2021   MCV 90.1 01/25/2021   PLT 158 01/25/2021   Lab Results  Component Value Date   FERRITIN 459 (H) 11/23/2020   IRON 49 11/23/2020   TIBC 238 11/23/2020   UIBC 189 11/23/2020   IRONPCTSAT 20 (L) 11/23/2020   Lab Results  Component Value Date   RETICCTPCT 2.6 01/25/2021   RBC 3.20 (L) 01/25/2021   No results found for: KPAFRELGTCHN, LAMBDASER, KAPLAMBRATIO No results found for: IGGSERUM, IGA, IGMSERUM No results found for: Ronnald Ramp, A1GS, Nelida Meuse, SPEI   Chemistry      Component Value Date/Time   NA 138 11/23/2020 1338   K 5.3 (H) 11/23/2020 1338   CL 106 11/23/2020 1338   CO2 25 11/23/2020 1338   BUN 50 (H) 11/23/2020 1338   CREATININE 2.55 (H) 11/23/2020 1338   CREATININE 1.97 (H) 06/24/2020 1431      Component Value Date/Time   CALCIUM 9.9 11/23/2020 1338   ALKPHOS 91 11/23/2020 1338   AST 13 (L) 11/23/2020 1338   ALT 8 11/23/2020 1338   BILITOT 0.3 11/23/2020 1338       Impression and Plan: Ms. Robak is a very pleasant 65 yo caucasian female withlong standing history of iron deficiency anemia as well as  history of DVT's in both the right and left peroneal and posterior tibial veins while she was bed bound in the hospital being treated for osteomyelitis. 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 D-dimer today is 1.66.  We will give IV iron today for low Hgb and symptomatic anemia.  She will continue her same regimen with Xarelto.  Follow-up in 8 weeks.  She was encouraged to contact our office with any questions or concerns.   Laverna Peace, NP 3/29/20222:23 PM   Addendum: Patient called regarding elevated Creatinine and potassium level. She denies taking a potassium supplement. She has no complaints and is not in any distress at this time. We advised she call EMS if she becomes symptomatic with SOB, chest discomfort or palpitations. Labs were sent to her PCP Dr. Etter Sjogren as well as Dr. Carolin Sicks her nephrologist.

## 2021-01-25 NOTE — Telephone Encounter (Signed)
Called pt with results, instructed pt to call Dr. Abbe Amsterdam at [facility] regarding her lab results.  A copy of labs will be faxed to Dr. Langston Reusing office. Fax# [phone] Pt verbalized understanding, no further concerns.

## 2021-01-25 NOTE — Patient Instructions (Signed)

## 2021-01-26 ENCOUNTER — Telehealth: Payer: Self-pay | Admitting: *Deleted

## 2021-01-26 ENCOUNTER — Encounter: Admit: 2021-01-26 | Discharge: 2021-01-26 | Payer: MEDICARE

## 2021-01-26 LAB — IRON AND TIBC
Iron: 59 ug/dL (ref 41–142)
Saturation Ratios: 22 % (ref 21–57)
TIBC: 269 ug/dL (ref 236–444)
UIBC: 209 ug/dL (ref 120–384)

## 2021-01-26 LAB — CARDIOLIPIN ANTIBODIES, IGG, IGM, IGA
Anticardiolipin IgA: <9 APL U/mL (ref 0–11)
Anticardiolipin IgG: 16 GPL U/mL — ABNORMAL HIGH (ref 0–14)
Anticardiolipin IgM: <9 MPL U/mL (ref 0–12)

## 2021-01-26 LAB — HOMOCYSTEINE: Homocysteine: 22.3 umol/L — ABNORMAL HIGH (ref 0.0–17.2)

## 2021-01-26 LAB — FERRITIN: Ferritin: 532 ng/mL — ABNORMAL HIGH (ref 11–307)

## 2021-01-26 NOTE — Telephone Encounter
Pt notified.

## 2021-01-26 NOTE — Telephone Encounter
Call Butch Penny -     Her lab drawn yesterday looked good:    -Her kidney function is better -Creatinine improved at 1.82 (was 2.11 two weeks ago)    -Her anemia is stable.    Ask her to keep up the great work with her water intake.  We will recheck her lab next month, when she is here for her Annual Wellness Visit.

## 2021-01-26 NOTE — Telephone Encounter (Signed)
Per los 01/25/21 called and lvm of upcoming appointments

## 2021-01-28 ENCOUNTER — Other Ambulatory Visit: Payer: Self-pay

## 2021-01-28 ENCOUNTER — Encounter (HOSPITAL_BASED_OUTPATIENT_CLINIC_OR_DEPARTMENT_OTHER): Payer: PRIVATE HEALTH INSURANCE | Attending: Internal Medicine | Admitting: Internal Medicine

## 2021-01-28 ENCOUNTER — Encounter: Admit: 2021-01-28 | Discharge: 2021-01-28 | Payer: MEDICARE

## 2021-01-28 DIAGNOSIS — E11621 Type 2 diabetes mellitus with foot ulcer: Secondary | ICD-10-CM | POA: Insufficient documentation

## 2021-01-28 DIAGNOSIS — L97423 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: Secondary | ICD-10-CM | POA: Insufficient documentation

## 2021-01-28 DIAGNOSIS — Z833 Family history of diabetes mellitus: Secondary | ICD-10-CM | POA: Diagnosis not present

## 2021-01-28 DIAGNOSIS — Z8249 Family history of ischemic heart disease and other diseases of the circulatory system: Secondary | ICD-10-CM | POA: Insufficient documentation

## 2021-01-28 DIAGNOSIS — E1122 Type 2 diabetes mellitus with diabetic chronic kidney disease: Secondary | ICD-10-CM | POA: Insufficient documentation

## 2021-01-28 DIAGNOSIS — L97514 Non-pressure chronic ulcer of other part of right foot with necrosis of bone: Secondary | ICD-10-CM | POA: Diagnosis not present

## 2021-01-28 DIAGNOSIS — E1142 Type 2 diabetes mellitus with diabetic polyneuropathy: Secondary | ICD-10-CM | POA: Diagnosis not present

## 2021-01-28 DIAGNOSIS — I129 Hypertensive chronic kidney disease with stage 1 through stage 4 chronic kidney disease, or unspecified chronic kidney disease: Secondary | ICD-10-CM | POA: Diagnosis not present

## 2021-01-28 DIAGNOSIS — N184 Chronic kidney disease, stage 4 (severe): Secondary | ICD-10-CM | POA: Insufficient documentation

## 2021-01-28 MED ORDER — AMLODIPINE 10 MG PO TAB
ORAL_TABLET | Freq: Every day | 3 refills
Start: 2021-01-28 — End: ?

## 2021-01-28 MED ORDER — CYCLOBENZAPRINE 5 MG PO TAB
ORAL_TABLET | Freq: Every evening | 3 refills
Start: 2021-01-28 — End: ?

## 2021-01-28 MED ORDER — FLUTICASONE PROPIONATE 50 MCG/ACTUATION NA SPSN
Freq: Every day | 3 refills
Start: 2021-01-28 — End: ?

## 2021-01-28 NOTE — Progress Notes (Signed)
[name] [name] ([id number]) Visit Report for 01/28/2021 Debridement Details Patient Name: Date of Service: [name] [name] RE, [name] 01/28/2021 2:15 PM Medical Record Number: [id number] Patient Account Number: [id number] Date of Birth/Sex: Treating RN: 11-Aug-1956 (65 y.o. [name] [name] Primary Care Provider: Roma [name] Other Clinician: Referring Provider: Treating Provider/Extender: [name] [name] in Treatment: 2 Debridement Performed for Assessment: Wound #10 Right,Lateral Foot Performed By: Physician [name] [name]., MD Debridement Type: Debridement Severity of Tissue Pre Debridement: Fat layer exposed Level of Consciousness (Pre-procedure): Awake and Alert Pre-procedure Verification/Time Out Yes - 15:11 Taken: Start Time: 15:12 Pain Control: Other : Benzocaine T Area Debrided (L x W): otal 3 (cm) x 4 (cm) = 12 (cm) Tissue and other material debrided: Non-Viable, Callus, Subcutaneous, Skin: Dermis Level: Skin/Subcutaneous Tissue Debridement Description: Excisional Instrument: Blade Bleeding: Minimum Hemostasis Achieved: Pressure End Time: 15:16 Response to Treatment: Procedure was tolerated well Level of Consciousness (Post- Awake and Alert procedure): Post Debridement Measurements of Total Wound Length: (cm) 3 Width: (cm) 4 Depth: (cm) 0.5 Volume: (cm) 4.712 Character of Wound/Ulcer Post Debridement: Stable Severity of Tissue Post Debridement: Fat layer exposed Post Procedure Diagnosis Same as Pre-procedure Electronic Signature(s) Signed: 01/28/2021 5:01:47 PM By: Linton Ham MD Signed: 01/28/2021 5:18:29 PM By: Lorrin Jackson Entered By: Linton Ham on 01/28/2021 16:37:41 -------------------------------------------------------------------------------- HPI Details Patient Name: Date of Service: [name] [name], [name] 01/28/2021 2:15 PM Medical Record Number: [id number] Patient Account Number: [id number] Date of Birth/Sex:  Treating RN: October 01, 1956 (65 y.o. [name] [name] Primary Care Provider: Roma [name] Other Clinician: Referring Provider: Treating Provider/Extender: [name] [name] in Treatment: 2 History of Present Illness HPI Description: ADMISSION 11/12/2018 This is a 65 year old woman with type 2 diabetes and diabetic neuropathy. She has been dealing with a left heel plantar wound for roughly 2 years. She states this started when she pulled some skin off the area and it progressed into a wound. She also has an area on the tip of her left great toe for 1 year. She has been largely followed by Dr. Sharol Given and she had an excision of bone in the left heel in 2017. I do not see microbiology from this excision or pathology. Apparently this wound never really closed. She most recently has been using Silvadene cream. Offloading this with a scooter. The last MRI I see was in June 2018 which did not show osteomyelitis at that time. Apparently this wound is never really progressed towards healing. During her last review by Dr. Sharol Given in October it was recommended that she undergo a left BKA and she refused. She went to see a second orthopedic consult at [facility] who am recommended conservative wound care to see if this will close or progress towards closure but also warned that possible surgery may be necessary. An x-ray that was done at [facility] showed an irregular calcaneal body and calcaneal tuberosity. This is probably postprocedural. Previous x-rays at [facility] had suggested heterotrophic calcifications but I do not see this. Apparently a second ointment was added to the Silvadene which the patient thinks is because some improvement. She has not been systemically unwell. No fever or chills. She thinks the second ointment that was given to her at [facility] has helped somewhat. 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 ABIs done in June 2018 showed a ABI of 1.12 on the right and 1.1 on the left she was biphasic bilaterally. ABIs in our clinic were noncompressible today. 11/20/18 on evaluation today patient is seen for her second visit here in the office although this is actually the first visit with me concerning an issue that she's having with her great toe and heel of the left foot. Fortunately she does not appear to be have any discomfort at this time and she does have a scooter in order to offload her foot she also has a boot for offloading. With that being said this is something that appears to have been going on for some time she was seeing Dr. due to his recommendation was that she was going to require a below knee amputation. With that being said she wanted to come to the wound center but according to the patient Dr. Sharol Given told her that "we could not help her". Nonetheless she saw another provider who suggested that it may be worth a shot for [name] to try and help her out if it all possible. Nonetheless she decided that it would be worth a trial since otherwise any the way she's gonna end up with an amputation. Obviously if we can heal the wound then that will not be the case. Again I explained to her that obviously there are no guarantees but we will give this a good try and attempts to get the wound to heal. 1/30; the patient continues to have areas on the left plantar heel and the left plantar great toe. The more worrisome area is the heel. She went for MRI on Saturday but this could not be done because she had silver alginate in the wound bed. I would like to get this rebooked. If she does not have osteomyelitis she will need a total contact cast. We have been using silver  alginate in the wounds 2/7; patient has wounds on her left plantar heel and left plantar great toe. The area on the heel has some depth although it looks about the same today. Her MRI will finally be done tomorrow. If she has osteomyelitis in the heel and then we will need to consider her for IV antibiotics and hyperbaric oxygen. If the MRI is negative she will need a total contact cast although she is wearing a cam boot and using a scooter at present. Will be using silver alginate. She will take it off tomorrow in preparation for the MRI 2/14; the patient's MRI showed extensive surgical changes with a large portion of her calcaneus removed on the left secondary to her previous surgery by Dr. Sharol Given however there is no evidence of osteomyelitis. She would therefore be a candidate for a total contact cast and we applied this for the first time today 2/21; silver collagen total contact cast. The area on the plantar left great toe is "healed" still a lot of callus on this area. Dimensions on the plantar heel not too much different some epithelialization is present however. This is an improvement 12/25/18 on evaluation today patient actually is seen for follow-up concerning issues that she has been having with her cast she states she feels like it's wet and squishy in the bottom of her cast. The reason she does come in to have this evaluated and see what is going on. Fortunately there's no signs of infection at  this time was the cast was removed. 3/6; the patient's area on the tip of her right great toe is callused but there is no open area here. She still has the fairly extensive area in the left heel. We have been using silver collagen in the wound. 01/08/19 on evaluation today patient actually appears to be doing very well in regard to her heel ulcer in my pinion to see if you shown signs of improvement which is excellent news. There's no evidence of infection. She continues to have quite a bit of drainage  but fortunately again this doesn't seem to be hindering her healing. 3/20 -Patient's foot ulcer on the left appears to be doing well, the dimensions appear encouraging, we have been using total contact cast with Prisma and will continue doing that 3/27; patient continues to make nice progress on the left plantar heel. She is using silver collagen under a total contact cast. It is been a while since I have seen this wound and it really looks a lot better. Smaller with healthy granulation 4/3; she continues to make nice progress on the left plantar heel. Using silver collagen under a total contact cast 4/10; left plantar heel. Again skin over the surface of the wound with not much in the way of adherence. This results in undermining. Using silver collagen changed to silver alginate 4/17 left plantar heel. Again not much improvement. There is no undermining today no debridement was required we used silver alginate last time because of excessive moisture 4/24 left plantar heel. Again not as much improvement as I would have liked. About 3 mm of depth. Thick callused skin around the circumference. Using silver alginate 5/1; left plantar heel this is improved this week. Less depth epithelialization is present. Using silver alginate alginate under a total contact cast 5/8; left plantar heel dimensions are about the same. The depth appears to be improved. I use silver collagen starting today under the cast 5/15 left plantar heel. Arrives today with a 2-day history of feeling like something had "slipped" while walking her dog in the cast. Unfortunately extensive area relatively to the small wound of undermining superiorly denuded epithelium. 5/22-Patient returns at 1 week after being taken off the T[facility] on account of some fluid collection that was debrided and cultured at last visit from the plantar ulcer on the left heel. The culture results are polymicrobial with Klebsiella, enterococcus, Proteus growth  these organisms are sensitive to Cipro except with enterococcus which is sensitive to ampicillin but patient is highly allergic to penicillin according to her. 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 5/29; the patient arrived last week with a new wound on her left plantar great toe. With regards to the culture that I did 2 weeks ago of her deteriorating heel wound this grew Klebsiella and enterococcus. She was given Cipro however the enterococcus would not be covered well by a quinolone. She is allergic to penicillin. I will give her linezolid 600 twice daily x5 days 6/12; the patient continues to have a wound on her left and after she returns to the beach there is undermining laterally. She has the new wound from this 2 weeks or so ago on the plantar tip of her left great toe. She had a fall today scraping the dorsal surface of the left fifth 7/14; READMISSION since the patient was last here she was hospitalized from 04/15/2019 through 04/22/2019. She  had presented to an outside ER after falling and hitting her elbow. She had had previous surgery on the elbow and fractures several years ago. An x-ray was negative and she was sent home. She is readmitted with sepsis and acute renal failure secondary to septic arthritis of the elbow. Cultures apparently showed pansensitive staph aureus however she has a severe beta-lactam allergy. She was treated with vancomycin. Apparently the vancomycin is completed and her PICC line is removed although she is seeing Dr. Megan Salon tomorrow due to continued pain and swelling. In the hospital she had an IandD by Dr. [name] of orthopedics. The original surgery was on 04/17/2019 and it was felt that she had septic arthritis. She is still having a lot of pain and swelling in the right elbow and apparently is due to see Dr. Megan Salon tomorrow and what she thinks is that she will be  restarted on antibiotics. With regards to her heel wounds/left foot wounds. Her arterial studies were checked and her ABIs were within normal limits. X-ray showed plantar foot ulcer negative for osteomyelitis postop resection of the posterior calcaneus. She has been using silver alginate to the heel 8/4-Patient returns after being seen on 7/14, we are using silver alginate to the calcaneal wound, she is continuing to receive care for her right elbow septic arthritis 8/14- Patient returns after 1 week, she is being seen by the surgical group for her right elbow, she is here for the left calcaneal wound for which we are using silver alginate this is about the same 8/21; this is a patient I readmitted to the clinic 5 weeks ago. She is continuing to have difficulties with a septic arthritis of the right elbow she had after a fall and apparently has had surgical IandD's since the last time we saw her. She is also following with Dr. Megan Salon of infectious disease and is apparently on 3 oral antibiotics although at the time of this dictation I am not sure what they are. She comes in today with a necrotic surface on the left great toe with a blister laterally. This was still clearly an open wound. She had purulent drainage coming out of this. The original wound on the plantar aspect of her right heel in the setting of a Charcot foot is deep not open to bone but certainly not any better at all. We have been using silver alginate 8/28; dealing with septic arthritis of the right elbow. May need to go for further surgery here in the second week of September. She had a blister on the left great toe that was purulent [place] draining last week although a culture did not grow anything [already on antibiotics through infectious disease for her elbow]. The punched-out area on her heel is just like it was when she first came in. This almost closed with a total contact cast there are no options for that now. The patient  states she cannot stay off her foot having to do housework X-ray of the foot showed a moderate bunion and severe degenerative changes at the first metatarsal phalangeal joint there was a moderate plantar calcaneal spur. We will need to check the location of this. 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 It is oral. We are using silver collagen to the punched-out area on her left heel and to the summer superficial  area of the left great toe. She is offloading this is best she can although judging by the amount of callus it is not enough. She thinks she has enough strength in her right elbow now to use her scooter. 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 Hale Bogus notes and infectious disease. We are using silver collagen to the punched-out areas on her left heel and to the plantar aspect of her left great toe she is now using her scooter to get around and to help offload these areas 10/2; 2-week follow-up. Still on clindamycin I believe for the left elbow she is going for an MRI of the elbow over the weekend. She is then going to see orthopedics and infectious disease. The area on the left heel is certainly no better. This does not probe to bone however there is green drainage. She has an area on the tip of her toe. The area on the heel is in a wound we almost closed at one point with total contact casting. 10/9; one-week follow-up. Culture I did of the heel last week which was a swab culture admittedly showed moderate Enterococcus faecalis moderate Pseudomonas. The wound itself looks about the same. There is no palpable bone although the depth of it closely approximates bone. The  heel is swollen but not erythematous. Her MRI is booked for next week. She also has an MRI of the right elbow. I've also lifted the last consult from Dr. Megan Salon who is following her for septic arthritis of the right elbow. I did not see him specifically comment on her left heel however the patient states that he is aware of this. I did not specifically address the organisms I cultured last week because of the possible effect of any additional cultures that will be done on the elbow. Additionally these were superficial not bone cultures 10/16; her MRI of the heel was put back to next week. She did have her elbow done. I have been in contact with Dr. Megan Salon about my concerns about osteomyelitis of the left heel. We have been using collagen. She also has a wound on the plantar tip of her first toe 10/23; her MRI of the heel was negative for osteomyelitis but suggested a small abscess. She also had an MRI of her elbow that showed findings compatible with a septic joint. She went to see Dr. Megan Salon and she is going to have both oral and IV antibiotics which I am sure will cover any infection in the heel. I did not actually see his note today. We are using silver collagen offloading the heel with a heel offloading boot 11/6; patient continues with silver collagen to both wound areas on her plantar left heel and plantar left great toe. She remains on daptomycin by Dr. Megan Salon of infectious disease. She complains of diarrhea. Dr. Megan Salon is aware of this. 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 she has been put on oral vancomycin I think a lot of high clinical suspicion for pseudomembranous colitis Because of the right elbow there are no options to completely off her left foot beyond the heel offloading boot we have now. Fortunately the left heel ulcer  itself has  remained static some improvement in the plantar left great toe 11/20; patient arrives for review of her left heel ulcer. I also note she has a difficult time with septic arthritis of the right elbow. She currently is on IV daptomycin which seems to have helped the drainage in her elbow [MSSA]. Because of high concern for pseudomembranous colitis she was also put on vancomycin orally and Flagyl orally. She was on Levaquin but that was discontinued because of the concern about C. difficile. She states her diarrhea is better. She arrives in clinic today with a large area of denuded skin on the lateral part of the heel. This had totally separated. We have been using silver alginate to the wound areas. She arrived in a scooter telling me she is offloading the foot is much as possible. 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 Dr. Megan Salon stopped the daptomycin and now has her on vancomycin 1000 mg IV daily. This is supposed to go onto 1/18. She is being referred to [place] for what sounds like a elbow replacement type operation for her MSSA septic arthritis. She arrives in clinic today with a blister on the medial part of the wound on her heel. She still has a lot of callus around the heel although the surface of the wound on the left heel looks somewhat better. READMISSION 03/19/2020 Mrs. Dehaas is a 65 year old woman we have followed for almost all of 2020 with a neuropathic wound on her left heel and the tip of her left great toe.. We she had previously had a partial calcanectomy by Dr. Sharol Given because of underlying osteomyelitis. She also had a history of MRSA even when we admitted her to the clinic. An MRI when she first came into our clinic did not show osteomyelitis. We put her in a total contact cast and  gradually this contracted to the point it was almost closed however in that same timeframe she had a fall. 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 We had to stop putting her in a cast partially because of infection and partially because she could not support herself with the elbow. Things went progressively downhill. When we last saw her at the end of December she had a large open wound on the left heel. She had had recurrent infections in the right elbow. It was felt that either the heel lift: Ice her elbow or vice versa. We never had MRSA I do not believe cultured in the left heel however. 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 She underwent an operative debridement and a T11-L3 fusion. An operative culture grew Pseudomonas. She was treated with cefepime and required readmission to the hospital with acute kidney failure requiring temporary dialysis. She was discharged to rehab I believe she signed herself out [place]. He has been following with her primary doctor and she comes in the clinic today with miraculously the left heel totally closed. She is followed up with Dr. Megan Salon of infectious disease he does not feel she has any evidence of infection in her elbow or her heel for that matter and the heel is actually fully epithelialized. She is on ciprofloxacin 500 twice daily I think mostly directed at Pseudomonas. The patient is  convinced that it was the treatment of the Pseudomonas in her back that ultimately led to closure of her heel and that certainly possible or could have been because she was nonambulatory in the hospital for 3 months according to her. She is still using her heel offloading boot and she says she is "learning to walk again" Readmission: 06/09/2020 patient presents today for reevaluation here in the clinic following a reopening of her heel ulcer. She tells [name] that this actually  occurred about 2 weeks after she was last seen in May of this year though it is now the beginning of August and she has not come back until now to see [name]. Nonetheless she does have an open wound there is been no x-rays at this time infectious disease has been monitoring him following this up until this point. They made the recommendation that she come to see [name]. Fortunately there is no signs of active infection systemically though she does tell me that she had Pseudomonas in the spine when she had her back surgery she is currently on antibiotics for that. 8/20; this is a patient who I saw 1 time in late May. Apparently shortly after she was here the area on her plantar left heel started to drain again and she had an open wound. She has been going to her primary physician and has been following with Dr. Megan Salon. 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 She is currently on 3 times a day prophylactic ciprofloxacin. She is admitted to the clinic last week for review of the wound on the left heel. Notable for the fact that she had a partial calcanectomy by Dr. Sharol Given 3 or 4 years ago. She says she is using her motorized wheelchair again as an off loader but she came in in regular shoes. 9/2; 2-week follow-up. Plantar left heel. In spite of debridement last week she had thick very adherent callus around the circumference of the wound and a nonviable gray surface. We have been using Hydrofera Blue. We gave her a heel offloading boot last week. She says she is off her foot except for she needs to walk her dog. 9/17; 2 week follow-up. Lives with a heel ulcer in roughly the same condition as a month ago. Though requiring debridement and revision using Hydrofera Blue. She says she is often except for times that she needs to walk her dog and she claims to be using the heel offloading shoe. She has a new injury which  she says was a burn on the medial part of her fifth metacarpophalangeal on the right. 10/15; almost 1 month follow-up. She arrives with the plantar left heel ulcer and not a very good condition. She has thick callus surrounding the wound and a necrotic base over the top of this. I am not sure anybody is paying close attention to this. She says she walks minimally but I highly doubt this. She is supposed to be using Hydrofera Blue. She says she has plenty of supplies. She tells me that she is traveling to [place] next week for a 10-day trip. We will see her back after that She has both been approved for Dermagraft however I am only willing to attempt this if she is going to agree to a total contact cast. I do not think there is any point in applying this advance dressing when she is walking on  this excessively READMISSION 01/10/2021 The patient was last here in October at which time we were dealing with a very difficult wound on the left plantar heel. It was not in very good condition at the time. She tells me she went to [place] on vacation. Shortly after she arrived back her husband was in an accident and then after that she had congestive heart failure secondary to her advanced chronic renal failure. I thought she told me she was admitted to hospital but I certainly do not see this at [facility]. In any case while she was markedly fluid overloaded she developed an open area on the right lateral heel to go along with the original wound on the left. Both of these completely covered with necrotic surface. I do not know that she is been dressing these at all. She has a heel offloading boot on the left and an ordinary shoe on the right. Before she left last time I did an MRI of the left heel that did not show distinctly osteomyelitis. She was approved for Dermagraft but the wounds are certainly not in a state for application of an advanced treatment product at this point Besides this her  past medical history is largely unchanged. 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 Culture at that point showed Pseudomonas. She is not currently on antibiotics. She was on suppressive ciprofloxacin for the spine osteomyelitis but she says this was stopped by Dr. Megan Salon. Her primary doctor recently gave her a course of doxycycline but she could not tolerate it because of nausea and a rash ABIs in our clinic were 1.13 on the right and 1.15 on the left 3/25; patient comes back to see [name] after re-presenting last week with large ulcers on her bilateral heels. 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 She also has chronic renal failure. 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 She presents spent a prolonged period of time on antibiotics. Culture of the lumbar area I believes had Pseudomonas whereas peripheral cultures of the elbow showed MRSA. She presented last week with deep ulcers on her bilateral plantar heel and the lateral part of her right heel. She has never offloaded this properly. PLAIN X-rays of both heels did not show osteomyelitisin either heel.we've been using silver alginate while we work through the possibilities of coexistent infection/osteomyelitis. She is wearing bilateral heel offloading boots although I've never been certain about the adequacy of her offloading. She also has a scooter 4/1; bilateral heel ulcers the area on the left very deep but does not go to bone. Nevertheless the wound itself is somewhat  senescent and lifeless looking. She has her MRI booked for next week we should be able to go over this in a week's time. The area on the right has thick skin and callus and fibrinous debris on the surface. She is offloading with a scooter claims that she is being a lot more careful about offloading than she was in the past. She does not have an arterial issue by clinical exam or ABIs  Electronic Signature(s) Signed: 01/28/2021 5:01:47 PM By: Linton Ham MD Entered By: Linton Ham on 01/28/2021 16:38:42 -------------------------------------------------------------------------------- Physical Exam Details Patient Name: Date of Service: [name] [name], [name] 01/28/2021 2:15 PM Medical Record Number: [id number] Patient Account Number: [id number] Date of Birth/Sex: Treating RN: [birth year] (65 y.o. [name] [name] Primary Care Provider: Roma [name] Other Clinician: Referring Provider: Treating Provider/Extender: [name] [name] in Treatment: 2 Constitutional Patient is hypertensive.. Pulse regular and within target range for patient.[name] Kitchen Respirations regular, non-labored and within target range.. Temperature is normal and within the target range for the patient.[name] Kitchen Appears in no distress. Notes Wound exam Original area on the left plantar heel looks better in terms of her circumference. Somewhat of a lifeless surface however. I did not debride this The left plantar heel had callus thick skin around the edge I knocked this back with a #10 scalpel and then fibrinous debris on the surface hemostasis with a pressure dressing Electronic Signature(s) Signed: 01/28/2021 5:01:47 PM By: Linton Ham MD Entered By: Linton Ham on 01/28/2021 16:39:37 -------------------------------------------------------------------------------- Physician Orders Details Patient Name: Date of Service: [name] [name], [name] 01/28/2021 2:15 PM Medical Record Number: [id number] Patient  Account Number: [id number] Date of Birth/Sex: Treating RN: 1956-10-06 (65 y.o. 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 Bathing/ Shower/ Hygiene May shower and wash wound with soap and water. Off-Loading Open toe surgical shoe to: - Right Foot Heel suspension boot to: - Gloped Heel Offloader to left foot Additional Orders / Instructions Follow Nutritious Diet Wound Treatment Wound #10 - Foot Wound Laterality: Right, Lateral Cleanser: Soap and Water 1 x Per Day/15 Days Discharge Instructions: May shower and wash wound with dial antibacterial soap and water prior to dressing change. Prim Dressing: KerraCel Ag Gelling Fiber Dressing, 4x5 in (silver alginate) 1 x Per Day/15 Days ary Discharge Instructions: Apply silver alginate to wound bed as instructed Secondary Dressing: Woven Gauze Sponge, Non-Sterile 4x4 in 1 x Per Day/15 Days Discharge Instructions: Apply over primary dressing as directed. Secondary Dressing: ABD Pad, 5x9 1 x Per Day/15 Days Discharge Instructions: Apply over primary dressing as directed. Secured With: [employer], 4.5x3.1 (in/yd) 1 x Per Day/15 Days Discharge Instructions: Secure with Kerlix as directed. Secured With: Transpore Surgical Tape, 2x10 (in/yd) 1 x Per Day/15 Days Discharge Instructions: Secure dressing with tape as directed. Wound #9 - Calcaneus Wound Laterality: Left Cleanser: Soap and Water 1 x  Per TIR/44 Days Discharge Instructions: May shower and wash wound with dial antibacterial soap and water prior to dressing change. Prim Dressing: KerraCel Ag Gelling Fiber Dressing, 4x5 in (silver alginate) 1 x Per Day/15 Days ary Discharge Instructions: Apply silver alginate to wound bed as instructed Secondary Dressing: Woven Gauze Sponge, Non-Sterile 4x4 in 1 x Per Day/15 Days Discharge Instructions: Apply over primary dressing as directed. Secondary Dressing: ABD Pad, 5x9 1 x Per Day/15 Days Discharge Instructions: Apply over primary dressing as directed. Secured With: [employer], 4.5x3.1 (in/yd) 1 x Per Day/15 Days Discharge Instructions: Secure with Kerlix as directed.  Secured With: Transpore Surgical Tape, 2x10 (in/yd) 1 x Per Day/15 Days Discharge Instructions: Secure dressing with tape as directed. Notes MRI of Left Foot scheduled for 02/02/21 Electronic Signature(s) Signed: 01/28/2021 5:01:47 PM By: Linton Ham MD Signed: 01/28/2021 5:18:29 PM By: Lorrin Jackson Previous Signature: 01/28/2021 3:00:18 PM Version By: Lorrin Jackson Entered By: Lorrin Jackson on 01/28/2021 15:22:06 -------------------------------------------------------------------------------- Problem List Details Patient Name: Date of Service: [name] [name], [name] 01/28/2021 2:15 PM Medical Record Number: [id number] Patient Account Number: [id number] Date of Birth/Sex: Treating RN: Jul 12, 1956 (65 y.o. 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 01/28/2021 2:15 PM Medical Record Number: [id number] Patient Account Number: [id number] Date of Birth/Sex: Treating RN: Dec 16, 1955 (65 y.o. [name] [name] Primary Care Provider: Other Clinician: Roma [name] Referring Provider: Treating Provider/Extender: [name] [name] in Treatment: 2 Subjective History of Present Illness (HPI) ADMISSION 11/12/2018 This is a 65 year old woman with type 2 diabetes and diabetic neuropathy. She has been dealing with a left heel plantar wound for roughly 2 years. She states this started when she pulled some skin off the area and it progressed into a wound. She also has an area on the tip of her left great toe for 1 year. She has been largely followed by Dr. Sharol Given and she had an excision of bone in the left heel in 2017. I do not see microbiology from this excision or pathology. Apparently this wound never really closed. She most recently has been using Silvadene cream. Offloading this with a scooter. The last MRI I see was in June 2018 which did not show osteomyelitis at that time. Apparently this wound is never really progressed towards healing. During her last review by Dr. Sharol Given in October it was recommended that she undergo a left BKA and she refused. She went to see a second orthopedic consult at [facility] who am recommended conservative wound care to see if this will close or progress towards closure but also warned that possible surgery  may be necessary. An x-ray that was done at [facility] showed an irregular calcaneal body and calcaneal tuberosity. This is probably postprocedural. Previous x-rays at [facility] had suggested heterotrophic calcifications but I do not see this. Apparently a second ointment was added to the Silvadene which the patient thinks is because some improvement. She has not been systemically unwell. No fever or chills. She thinks the second ointment that was given to her at [facility] has helped somewhat. 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 ABIs done in June 2018 showed a ABI of 1.12 on the right and 1.1 on the left she was biphasic bilaterally. ABIs in our clinic were noncompressible today. 11/20/18 on evaluation today patient is seen for her second visit here in the office although this is actually the first visit with me concerning an issue that she's having with her great toe and heel of the left foot. Fortunately she does not appear to be have any discomfort at this time and she does have a scooter in order to offload her foot she also has a boot for offloading. With that being said this is something that appears to have been going on for some time she was seeing Dr. due to his recommendation was that she was going to require a below knee amputation. With that being said she wanted to come to the wound center but according to the patient Dr. Sharol Given told her that "we could not help her". Nonetheless she saw another provider who suggested that it may be worth a shot for [name] to try and help her out if it all possible. Nonetheless she decided that it would be worth a trial since otherwise any the way she's gonna end up with  an amputation. Obviously if we can heal the wound then that will not be the case. Again I explained to her that obviously there are no guarantees but we will give this a good try and attempts to get the wound to heal. 1/30; the patient continues to have areas on the left plantar heel and the left plantar great toe. The more worrisome area is the heel. She went for MRI on Saturday but this could not be done because she had silver alginate in the wound bed. I would like to get this rebooked. If she does not have osteomyelitis she will need a total contact cast. We have been using silver alginate in the wounds 2/7; patient has wounds on her left plantar heel and left plantar great toe. The area on the heel has some depth although it looks about the same today. Her MRI will finally be done tomorrow. If she has osteomyelitis in the heel and then we will need to consider her for IV antibiotics and hyperbaric oxygen. If the MRI is negative she will need a total contact cast although she is wearing a cam boot and using a scooter at present. Will be using silver alginate. She will take it off tomorrow in preparation for the MRI 2/14; the patient's MRI showed extensive surgical changes with a large portion of her calcaneus removed on the left secondary to her previous surgery by Dr. Sharol Given however there is no evidence of osteomyelitis. She would therefore be a candidate for a total contact cast and we applied this for the first time today 2/21; silver collagen total contact cast. The area on the plantar left great toe is "healed" still a lot of callus on this area. Dimensions on the plantar heel not too much different some epithelialization is present however. This is an improvement 12/25/18 on evaluation today patient actually is seen for follow-up concerning issues that she has been having with her cast she states she feels like it's wet and squishy in the bottom of her cast. The reason she does come in to have  this evaluated and see what is going on. Fortunately there's no signs of infection at this time was the cast was removed. 3/6; the patient's area on the tip  of her right great toe is callused but there is no open area here. She still has the fairly extensive area in the left heel. We have been using silver collagen in the wound. 01/08/19 on evaluation today patient actually appears to be doing very well in regard to her heel ulcer in my pinion to see if you shown signs of improvement which is excellent news. There's no evidence of infection. She continues to have quite a bit of drainage but fortunately again this doesn't seem to be hindering her healing. 3/20 -Patient's foot ulcer on the left appears to be doing well, the dimensions appear encouraging, we have been using total contact cast with Prisma and will continue doing that 3/27; patient continues to make nice progress on the left plantar heel. She is using silver collagen under a total contact cast. It is been a while since I have seen this wound and it really looks a lot better. Smaller with healthy granulation 4/3; she continues to make nice progress on the left plantar heel. Using silver collagen under a total contact cast 4/10; left plantar heel. Again skin over the surface of the wound with not much in the way of adherence. This results in undermining. Using silver collagen changed to silver alginate 4/17 left plantar heel. Again not much improvement. There is no undermining today no debridement was required we used silver alginate last time because of excessive moisture 4/24 left plantar heel. Again not as much improvement as I would have liked. About 3 mm of depth. Thick callused skin around the circumference. Using silver alginate 5/1; left plantar heel this is improved this week. Less depth epithelialization is present. Using silver alginate alginate under a total contact cast 5/8; left plantar heel dimensions are about the same.  The depth appears to be improved. I use silver collagen starting today under the cast 5/15 left plantar heel. Arrives today with a 2-day history of feeling like something had "slipped" while walking her dog in the cast. Unfortunately extensive area relatively to the small wound of undermining superiorly denuded epithelium. 5/22-Patient returns at 1 week after being taken off the [facility] on account of some fluid collection that was debrided and cultured at last visit from the plantar ulcer on the left heel. The culture results are polymicrobial with Klebsiella, enterococcus, Proteus growth these organisms are sensitive to Cipro except with enterococcus which is sensitive to ampicillin but patient is highly allergic to penicillin according to her. 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 5/29; the patient arrived last week with a new wound on her left plantar great toe. With regards to the culture that I did 2 weeks ago of her deteriorating heel wound this grew Klebsiella and enterococcus. She was given Cipro however the enterococcus would not be covered well by a quinolone. She is allergic to penicillin. I will give her linezolid 600 twice daily x5 days 6/12; the patient continues to have a wound on her left and after she returns to the beach there is undermining laterally. She has the new wound from this 2 weeks or so ago on the plantar tip of her left great toe. She had a fall today scraping the dorsal surface of the left fifth 7/14; READMISSION since the patient was last here she was hospitalized from 04/15/2019 through 04/22/2019. She had presented to an outside ER after falling and hitting her elbow. She had had  previous surgery on the elbow and fractures several years ago. An x-ray was negative and she was sent home. She is readmitted with sepsis and acute renal failure secondary to septic arthritis of the elbow.  Cultures apparently showed pansensitive staph aureus however she has a severe beta-lactam allergy. She was treated with vancomycin. Apparently the vancomycin is completed and her PICC line is removed although she is seeing Dr. Megan Salon tomorrow due to continued pain and swelling. In the hospital she had an IandD by Dr. [name] of orthopedics. The original surgery was on 04/17/2019 and it was felt that she had septic arthritis. She is still having a lot of pain and swelling in the right elbow and apparently is due to see Dr. Megan Salon tomorrow and what she thinks is that she will be restarted on antibiotics. With regards to her heel wounds/left foot wounds. Her arterial studies were checked and her ABIs were within normal limits. X-ray showed plantar foot ulcer negative for osteomyelitis postop resection of the posterior calcaneus. She has been using silver alginate to the heel 8/4-Patient returns after being seen on 7/14, we are using silver alginate to the calcaneal wound, she is continuing to receive care for her right elbow septic arthritis 8/14- Patient returns after 1 week, she is being seen by the surgical group for her right elbow, she is here for the left calcaneal wound for which we are using silver alginate this is about the same 8/21; this is a patient I readmitted to the clinic 5 weeks ago. She is continuing to have difficulties with a septic arthritis of the right elbow she had after a fall and apparently has had surgical IandD's since the last time we saw her. She is also following with Dr. Megan Salon of infectious disease and is apparently on 3 oral antibiotics although at the time of this dictation I am not sure what they are. She comes in today with a necrotic surface on the left great toe with a blister laterally. This was still clearly an open wound. She had purulent drainage coming out of this. The original wound on the plantar aspect of her right heel in the setting of a Charcot foot  is deep not open to bone but certainly not any better at all. We have been using silver alginate 8/28; dealing with septic arthritis of the right elbow. May need to go for further surgery here in the second week of September. She had a blister on the left great toe that was purulent [place] draining last week although a culture did not grow anything [already on antibiotics through infectious disease for her elbow]. The punched-out area on her heel is just like it was when she first came in. This almost closed with a total contact cast there are no options for that now. The patient states she cannot stay off her foot having to do housework X-ray of the foot showed a moderate bunion and severe degenerative changes at the first metatarsal phalangeal joint there was a moderate plantar calcaneal spur. We will need to check the location of this. 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 It is oral. We are using silver collagen to the punched-out area on her left heel and to the summer superficial area of the left great toe. She is offloading this is best she can although  judging by the amount of callus it is not enough. She thinks she has enough strength in her right elbow now to use her scooter. 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 Hale Bogus notes and infectious disease. We are using silver collagen to the punched-out areas on her left heel and to the plantar aspect of her left great toe she is now using her scooter to get around and to help offload these areas 10/2; 2-week follow-up. Still on clindamycin I believe for the left elbow she is going for an MRI of the elbow over the weekend.  She is then going to see orthopedics and infectious disease. The area on the left heel is certainly no better. This does not probe to bone however there is green drainage. She has an area on the tip of her toe. The area on the heel is in a wound we almost closed at one point with total contact casting. 10/9; one-week follow-up. Culture I did of the heel last week which was a swab culture admittedly showed moderate Enterococcus faecalis moderate Pseudomonas. The wound itself looks about the same. There is no palpable bone although the depth of it closely approximates bone. The heel is swollen but not erythematous. Her MRI is booked for next week. She also has an MRI of the right elbow. I've also lifted the last consult from Dr. Megan Salon who is following her for septic arthritis of the right elbow. I did not see him specifically comment on her left heel however the patient states that he is aware of this. I did not specifically address the organisms I cultured last week because of the possible effect of any additional cultures that will be done on the elbow. Additionally these were superficial not bone cultures 10/16; her MRI of the heel was put back to next week. She did have her elbow done. I have been in contact with Dr. Megan Salon about my concerns about osteomyelitis of the left heel. We have been using collagen. She also has a wound on the plantar tip of her first toe 10/23; her MRI of the heel was negative for osteomyelitis but suggested a small abscess. She also had an MRI of her elbow that showed findings compatible with a septic joint. She went to see Dr. Megan Salon and she is going to have both oral and IV antibiotics which I am sure will cover any infection in the heel. I did not actually see his note today. We are using silver collagen offloading the heel with a heel offloading boot 11/6; patient continues with silver collagen to both wound areas on her plantar left heel and plantar left great  toe. She remains on daptomycin by Dr. Megan Salon of infectious disease. She complains of diarrhea. Dr. Megan Salon is aware of this. 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 she has been put on oral vancomycin I think a lot of high clinical suspicion for pseudomembranous colitis Because of the right elbow there are no options to completely off her left foot beyond the heel offloading boot we have now. Fortunately the left heel ulcer itself has remained static some improvement in the plantar left great toe 11/20; patient  arrives for review of her left heel ulcer. I also note she has a difficult time with septic arthritis of the right elbow. She currently is on IV daptomycin which seems to have helped the drainage in her elbow [MSSA]. Because of high concern for pseudomembranous colitis she was also put on vancomycin orally and Flagyl orally. She was on Levaquin but that was discontinued because of the concern about C. difficile. She states her diarrhea is better. She arrives in clinic today with a large area of denuded skin on the lateral part of the heel. This had totally separated. We have been using silver alginate to the wound areas. She arrived in a scooter telling me she is offloading the foot is much as possible. 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 Dr. Megan Salon stopped the daptomycin and now has her on vancomycin 1000 mg IV daily. This is supposed to go onto 1/18. She is being referred to [place] for what sounds like a elbow replacement type operation for her MSSA septic arthritis. She arrives in clinic today with a blister on the medial part of the wound on  her heel. She still has a lot of callus around the heel although the surface of the wound on the left heel looks somewhat better. READMISSION 03/19/2020 Mrs. Conyer is a 65 year old woman we have followed for almost all of 2020 with a neuropathic wound on her left heel and the tip of her left great toe.. We she had previously had a partial calcanectomy by Dr. Sharol Given because of underlying osteomyelitis. She also had a history of MRSA even when we admitted her to the clinic. An MRI when she first came into our clinic did not show osteomyelitis. We put her in a total contact cast and gradually this contracted to the point it was almost closed however in that same timeframe she had a fall. 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 We had to stop putting her in a cast partially because of infection and partially because she could not support herself with the elbow. Things went progressively downhill. When we last saw her at the end of December she had a large open wound on the left heel. She had had recurrent infections in the right elbow. It was felt that either the heel lift: Ice her elbow or vice versa. We never had MRSA I do not believe cultured in the left heel however. 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 She underwent an operative debridement and a T11-L3 fusion. An operative culture grew Pseudomonas. She was treated with cefepime and required readmission to the hospital with acute kidney failure requiring temporary dialysis. She was discharged to rehab I believe she signed herself out [place]. He has been following with her primary doctor and she comes in the clinic today with miraculously the left heel totally closed. She is followed up with Dr. Megan Salon of infectious disease he does not feel she has any evidence of infection in her elbow or her heel for that matter and the heel is actually fully  epithelialized. She is on ciprofloxacin 500 twice daily I think mostly directed at Pseudomonas. The patient is convinced that it was the treatment of the Pseudomonas in her back that ultimately  led to closure of her heel and that certainly possible or could have been because she was nonambulatory in the hospital for 3 months according to her. She is still using her heel offloading boot and she says she is "learning to walk again" Readmission: 06/09/2020 patient presents today for reevaluation here in the clinic following a reopening of her heel ulcer. She tells [name] that this actually occurred about 2 weeks after she was last seen in May of this year though it is now the beginning of August and she has not come back until now to see [name]. Nonetheless she does have an open wound there is been no x-rays at this time infectious disease has been monitoring him following this up until this point. They made the recommendation that she come to see [name]. Fortunately there is no signs of active infection systemically though she does tell me that she had Pseudomonas in the spine when she had her back surgery she is currently on antibiotics for that. 8/20; this is a patient who I saw 1 time in late May. Apparently shortly after she was here the area on her plantar left heel started to drain again and she had an open wound. She has been going to her primary physician and has been following with Dr. Megan Salon. 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 She is currently on 3 times a day prophylactic ciprofloxacin. She is admitted to the clinic last week for review of the wound on the left heel. Notable for the fact that she had a partial calcanectomy by Dr. Sharol Given 3 or 4 years ago. She says she is using her motorized wheelchair again as an off loader but she came in in regular shoes. 9/2; 2-week follow-up. Plantar left heel. In  spite of debridement last week she had thick very adherent callus around the circumference of the wound and a nonviable gray surface. We have been using Hydrofera Blue. We gave her a heel offloading boot last week. She says she is off her foot except for she needs to walk her dog. 9/17; 2 week follow-up. Lives with a heel ulcer in roughly the same condition as a month ago. Though requiring debridement and revision using Hydrofera Blue. She says she is often except for times that she needs to walk her dog and she claims to be using the heel offloading shoe. She has a new injury which she says was a burn on the medial part of her fifth metacarpophalangeal on the right. 10/15; almost 1 month follow-up. She arrives with the plantar left heel ulcer and not a very good condition. She has thick callus surrounding the wound and a necrotic base over the top of this. I am not sure anybody is paying close attention to this. She says she walks minimally but I highly doubt this. She is supposed to be using Hydrofera Blue. She says she has plenty of supplies. She tells me that she is traveling to [place] next week for a 10-day trip. We will see her back after that She has both been approved for Dermagraft however I am only willing to attempt this if she is going to agree to a total contact cast. I do not think there is any point in applying this advance dressing when she is walking on this excessively READMISSION 01/10/2021 The patient was last here in October at which time we  were dealing with a very difficult wound on the left plantar heel. It was not in very good condition at the time. She tells me she went to [place] on vacation. Shortly after she arrived back her husband was in an accident and then after that she had congestive heart failure secondary to her advanced chronic renal failure. I thought she told me she was admitted to hospital but I certainly do not see this at [facility] health. In any case  while she was markedly fluid overloaded she developed an open area on the right lateral heel to go along with the original wound on the left. Both of these completely covered with necrotic surface. I do not know that she is been dressing these at all. She has a heel offloading boot on the left and an ordinary shoe on the right. Before she left last time I did an MRI of the left heel that did not show distinctly osteomyelitis. She was approved for Dermagraft but the wounds are certainly not in a state for application of an advanced treatment product at this point Besides this her past medical history is largely unchanged. 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 Culture at that point showed Pseudomonas. She is not currently on antibiotics. She was on suppressive ciprofloxacin for the spine osteomyelitis but she says this was stopped by Dr. Megan Salon. Her primary doctor recently gave her a course of doxycycline but she could not tolerate it because of nausea and a rash ABIs in our clinic were 1.13 on the right and 1.15 on the left 3/25; patient comes back to see [name] after re-presenting last week with large ulcers on her bilateral heels. 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 She also has chronic renal failure. 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 She presents spent a prolonged period of time on antibiotics. Culture of the lumbar area I believes had Pseudomonas whereas peripheral cultures of the elbow showed MRSA. She presented  last week with deep ulcers on her bilateral plantar heel and the lateral part of her right heel. She has never offloaded this properly. PLAIN X-rays of both heels did not show osteomyelitisin either heel.we've been using silver alginate while we work through the possibilities of coexistent infection/osteomyelitis. She is wearing bilateral heel offloading boots although I've never been certain about the adequacy of her offloading. She also has a scooter 4/1; bilateral heel ulcers the area on the left very deep but does not go to bone. Nevertheless the wound itself is somewhat senescent and lifeless looking. She has her MRI booked for next week we should be able to go over this in a week's time. The area on the right has thick skin and callus and fibrinous debris on the surface. She is offloading with a scooter claims that she is being a lot more careful about offloading than she was in the past. She does not have an arterial issue by clinical exam or ABIs Objective Constitutional Patient is hypertensive.. Pulse regular and within target range for patient.[name] Kitchen Respirations regular,  non-labored and within target range.. Temperature is normal and within the target range for the patient.[name] Kitchen Appears in no distress. Vitals Time Taken: 2:50 PM, Height: 61 in, Source: Stated, Weight: 162 lbs, Source: Stated, BMI: 30.6, Temperature: 97.5 F, Pulse: 82 bpm, Respiratory Rate: 18 breaths/min, Blood Pressure: 171/94 mmHg, Capillary Blood Glucose: 114 mg/dl. General Notes: glucose per pt report this am General Notes: Wound exam ooOriginal area on the left plantar heel looks better in terms of her circumference. Somewhat of a lifeless surface however. I did not debride this ooThe left plantar heel had callus thick skin around the edge I knocked this back with a #10 scalpel and then fibrinous debris on the surface hemostasis with a pressure dressing Integumentary (Hair, Skin) Wound #10 status is Open. Original cause  of wound was Gradually Appeared. The date acquired was: 09/29/2020. The wound has been in treatment 2 weeks. The wound is located on the Right,Lateral Foot. The wound measures 3cm length x 4cm width x 0.5cm depth; 9.425cm^2 area and 4.712cm^3 volume. There is Fat Layer (Subcutaneous Tissue) exposed. There is no tunneling or undermining noted. There is a medium amount of serosanguineous drainage noted. The wound margin is distinct with the outline attached to the wound base. There is large (67-100%) red, pink, pale granulation within the wound bed. There is a small (1-33%) amount of necrotic tissue within the wound bed including Adherent Slough. Wound #9 status is Open. Original cause of wound was Gradually Appeared. The date acquired was: 01/11/2019. The wound has been in treatment 2 weeks. The wound is located on the Left Calcaneus. The wound measures 5cm length x 4.2cm width x 1cm depth; 16.493cm^2 area and 16.493cm^3 volume. There is Fat Layer (Subcutaneous Tissue) exposed. There is no tunneling or undermining noted. There is a medium amount of serosanguineous drainage noted. The wound margin is distinct with the outline attached to the wound base. There is large (67-100%) red, pink, pale granulation within the wound bed. There is a small (1-33%) amount of necrotic tissue within the wound bed including Adherent Slough. 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 There was a Excisional Skin/Subcutaneous Tissue Debridement with a total area of 12 sq cm performed by  [name] [name]., MD. With the following instrument(s): Blade to remove Non-Viable tissue/material. Material removed includes Callus, Subcutaneous Tissue, and Skin: Dermis after achieving pain control using Other (Benzocaine). No specimens were taken. A time out was conducted at 15:11, prior to the start of the procedure. A Minimum amount of bleeding was controlled with Pressure. The procedure was tolerated well. Post Debridement Measurements: 3cm length x 4cm width x 0.5cm depth; 4.712cm^3 volume. Character of Wound/Ulcer Post Debridement is stable. Severity of Tissue Post Debridement is: Fat layer exposed. Post procedure Diagnosis Wound #10: Same as Pre-Procedure Plan Follow-up Appointments: Return Appointment in 1 week. Bathing/ Shower/ Hygiene: May shower and wash wound with soap and water. Off-Loading: Open toe surgical shoe to: - Right Foot Heel suspension boot to: - Gloped Heel Offloader to left foot Additional Orders / Instructions: Follow Nutritious Diet General Notes: MRI of Left Foot scheduled for 02/02/21 WOUND #  10: - Foot Wound Laterality: Right, Lateral Cleanser: Soap and Water 1 x Per Day/15 Days Discharge Instructions: May shower and wash wound with dial antibacterial soap and water prior to dressing change. Prim Dressing: KerraCel Ag Gelling Fiber Dressing, 4x5 in (silver alginate) 1 x Per Day/15 Days ary Discharge Instructions: Apply silver alginate to wound bed as instructed Secondary Dressing: Woven Gauze Sponge, Non-Sterile 4x4 in 1 x Per Day/15 Days Discharge Instructions: Apply over primary dressing as directed. Secondary Dressing: ABD Pad, 5x9 1 x Per Day/15 Days Discharge Instructions: Apply over primary dressing as directed. Secured With: [employer], 4.5x3.1 (in/yd) 1 x Per Day/15 Days Discharge Instructions: Secure with Kerlix as directed. Secured With: [employer] T ape, 2x10 (in/yd) 1 x Per Day/15 Days Discharge Instructions: Secure  dressing with tape as directed. WOUND #9: - Calcaneus Wound Laterality: Left Cleanser: Soap and Water 1 x Per ZOX/09 Days Discharge Instructions: May shower and wash wound with dial antibacterial soap and water prior to dressing change. Prim Dressing: KerraCel Ag Gelling Fiber Dressing, 4x5 in (silver alginate) 1 x Per Day/15 Days ary Discharge Instructions: Apply silver alginate to wound bed as instructed Secondary Dressing: Woven Gauze Sponge, Non-Sterile 4x4 in 1 x Per Day/15 Days Discharge Instructions: Apply over primary dressing as directed. Secondary Dressing: ABD Pad, 5x9 1 x Per Day/15 Days Discharge Instructions: Apply over primary dressing as directed. Secured With: [employer], 4.5x3.1 (in/yd) 1 x Per Day/15 Days Discharge Instructions: Secure with Kerlix as directed. Secured With: [employer] T ape, 2x10 (in/yd) 1 x Per Day/15 Days Discharge Instructions: Secure dressing with tape as directed. 1. We are still using silver alginate 2. ABDs, kerlix 3. Her MRI is next week if this is positive she will probably need inflammatory markers and another trip back to see Dr. Megan Salon of infectious disease. If however it is negative then I am probably going to cast 1 of these wounds. The area that might have the best chance of healing the quickest is on the right and that may be the way I go. We do not have to worry about her driving Electronic Signature(s) Signed: 01/28/2021 5:01:47 PM By: Linton Ham MD Entered By: Linton Ham on 01/28/2021 16:41:08 -------------------------------------------------------------------------------- SuperBill Details Patient Name: Date of Service: [name] [name], [name] 01/28/2021 Medical Record Number: [id number] Patient Account Number: [id number] Date of Birth/Sex: Treating RN: [id number] (65 y.o. 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 E11.42 Type 2 diabetes mellitus with diabetic polyneuropathy Facility Procedures CPT4 Code: [id number] Description: 86578 - DEB SUBQ TISSUE 20 SQ CM/< ICD-10 Diagnosis Description L97.514 Non-pressure chronic ulcer of other part of right foot with necrosis of bone Modifier: Quantity: 1 Physician Procedures : CPT4 Code Description Modifier [id number] - WC PHYS SUBQ TISS 20 SQ CM ICD-10 Diagnosis Description L97.514 Non-pressure chronic ulcer of other part of right foot with necrosis of bone Quantity: 1 Electronic Signature(s) Signed: 01/28/2021 5:01:47 PM By: Linton Ham MD Entered By: Linton Ham on 01/28/2021 16:41:34

## 2021-01-31 NOTE — Progress Notes (Signed)
OZZIE, REMMERS ([id number]) Visit Report for 01/28/2021 Arrival Information Details Patient Name: Date of Service: [name], [name] 01/28/2021 2:15 PM Medical Record Number: [id number] Patient Account Number: [id number] Date of Birth/Sex: Treating RN: [birth year] (65 y.o. [name] Primary Care Gerry Blanchfield: [name] Other Clinician: Referring Karrie Fluellen: Treating Christoph Copelan/Extender: [name] in Treatment: 2 Visit Information History Since Last Visit Added or deleted any medications: No Patient Arrived: Wheel Chair Any new allergies or adverse reactions: No Arrival Time: 14:48 Had a fall or experienced change in No Accompanied By: self activities of daily living that may affect Transfer Assistance: None risk of falls: Patient Identification Verified: Yes Signs or symptoms of abuse/neglect since No Secondary Verification Process Completed: Yes last visito Patient Requires Transmission-Based Precautions: No Hospitalized since last visit: No Patient Has Alerts: No Implantable device outside of the clinic No excluding cellular tissue based products placed in the center since last visit: Has Dressing in Place as Prescribed: Yes Has Footwear/Offloading in Place as Yes Prescribed: Left: Other:globoped Right: Surgical Shoe with Pressure Relief Insole Pain Present Now: Yes Electronic Signature(s) Signed: 01/28/2021 5:19:58 PM By: Baruch Gouty RN, BSN Entered By: Baruch Gouty on 01/28/2021 14:50:05 -------------------------------------------------------------------------------- Encounter Discharge Information Details Patient Name: Date of Service: [name], [name] 01/28/2021 2:15 PM Medical Record Number: [id number] Patient Account Number: [id number] Date of Birth/Sex: Treating RN: 08-19-1956 (65 y.o. [name], Tammi Klippel Primary Care Berenis Corter: [name] Other Clinician: Referring Gal Smolinski: Treating Von Inscoe/Extender:  [name] in Treatment: 2 Encounter Discharge Information Items Post Procedure Vitals Discharge Condition: Stable Temperature (F): 97.5 Ambulatory Status: Wheelchair Pulse (bpm): 82 Discharge Destination: Home Respiratory Rate (breaths/min): 18 Transportation: Private Auto Blood Pressure (mmHg): 171/94 Accompanied By: self Schedule Follow-up Appointment: Yes Clinical Summary of Care: Electronic Signature(s) Signed: 01/28/2021 5:39:41 PM By: Deon Pilling Entered By: Deon Pilling on 01/28/2021 17:15:38 -------------------------------------------------------------------------------- Lower Extremity Assessment Details Patient Name: Date of Service: [place], [name] 01/28/2021 2:15 PM Medical Record Number: [id number] Patient Account Number: [id number] Date of Birth/Sex: Treating RN: 02-Dec-1955 (65 y.o. [name] Primary Care Zareth Rippetoe: [name] Other Clinician: Referring Luci Bellucci: Treating Martie Muhlbauer/Extender: [name] in Treatment: 2 Edema Assessment Assessed: Shirlyn Goltz: No] [Right: No] Edema: [Left: Yes] [Right: Yes] Calf Left: Right: Point of Measurement: 35 cm From Medial Instep 33.5 cm 35.4 cm Ankle Left: Right: Point of Measurement: 8 cm From Medial Instep 21.8 cm 24 cm Vascular Assessment Pulses: Dorsalis Pedis Palpable: [Left:Yes] [Right:Yes] Electronic Signature(s) Signed: 01/28/2021 5:19:58 PM By: Baruch Gouty RN, BSN Entered By: Baruch Gouty on 01/28/2021 14:54:05 -------------------------------------------------------------------------------- Multi Wound Chart Details Patient Name: Date of Service: [name], [name] 01/28/2021 2:15 PM Medical Record Number: [id number] Patient Account Number: [id number] Date of Birth/Sex: Treating RN: 1956-09-04 (65 y.o. Sue Lush Primary Care Quinnlan Abruzzo: [name] Other Clinician: Referring Folashade Gamboa: Treating  Marylen Zuk/Extender: [name] in Treatment: 2 Vital Signs Height(in): 56 Capillary Blood Glucose(mg/dl): 114 Weight(lbs): 162 Pulse(bpm): 59 Body Mass Index(BMI): 31 Blood Pressure(mmHg): 171/94 Temperature(F): 97.5 Respiratory Rate(breaths/min): 18 Photos: [10:Right, Lateral Foot] [9:No Photos Left Calcaneus] [N/A:N/A N/A] Wound Location: [10:Gradually Appeared] [9:Gradually Appeared] [N/A:N/A] Wounding Event: [10:Diabetic Wound/Ulcer of the Lower] [9:Diabetic Wound/Ulcer of the Lower] [N/A:N/A] Primary Etiology: [10:Extremity Cataracts, Anemia, Sleep Apnea,] [9:Extremity Cataracts, Anemia, Sleep Apnea,] [N/A:N/A] Comorbid History: [10:Coronary Artery Disease, Hypertension, Myocardial Infarction, Hypertension, Myocardial Infarction, Type II Diabetes, Osteoarthritis, Osteomyelitis, Neuropathy 09/29/2020] [9:Coronary Artery Disease, Type II  Diabetes, Osteoarthritis,  Osteomyelitis, Neuropathy 01/11/2019] [N/A:N/A] Date Acquired: [10:2] [9:2] [N/A:N/A] Weeks of Treatment: [10:Open] [9:Open] [N/A:N/A] Wound Status: [10:3x4x0.5] [9:5x4.2x1] [N/A:N/A] Measurements L x W x D (cm) [10:9.425] [9:16.493] [N/A:N/A] A (cm) : rea [10:4.712] [9:16.493] [N/A:N/A] Volume (cm) : [10:11.10%] [9:-41.90%] [N/A:N/A] % Reduction in A [10:rea: 68.30%] [9:35.50%] [N/A:N/A] % Reduction in Volume: [10:Grade 2] [9:Grade 2] [N/A:N/A] Classification: [10:Medium] [9:Medium] [N/A:N/A] Exudate A mount: [10:Serosanguineous] [9:Serosanguineous] [N/A:N/A] Exudate Type: [10:red, brown] [9:red, brown] [N/A:N/A] Exudate Color: [10:Distinct, outline attached] [9:Distinct, outline attached] [N/A:N/A] Wound Margin: [10:Large (67-100%)] [9:Large (67-100%)] [N/A:N/A] Granulation A mount: [10:Red, Pink, Pale] [9:Red, Pink, Pale] [N/A:N/A] Granulation Quality: [10:Small (1-33%)] [9:Small (1-33%)] [N/A:N/A] Necrotic A mount: [10:Fat Layer (Subcutaneous Tissue): Yes Fat Layer (Subcutaneous  Tissue): Yes N/A] Exposed Structures: [10:Fascia: No Tendon: No Muscle: No Joint: No Bone: No Small (1-33%)] [9:Fascia: No Tendon: No Muscle: No Joint: No Bone: No Small (1-33%)] [N/A:N/A] Epithelialization: [10:Debridement - Selective/Open Wound N/A] [N/A:N/A] Debridement: Pre-procedure Verification/Time Out 15:11 [9:N/A] [N/A:N/A] Taken: [10:Other] [9:N/A] [N/A:N/A] Pain Control: [10:Callus] [9:N/A] [N/A:N/A] Tissue Debrided: [10:Skin/Dermis] [9:N/A] [N/A:N/A] Level: [10:12] [9:N/A] [N/A:N/A] Debridement A (sq cm): [10:rea Blade] [9:N/A] [N/A:N/A] Instrument: [10:Minimum] [9:N/A] [N/A:N/A] Bleeding: [10:Pressure] [9:N/A] [N/A:N/A] Hemostasis A chieved: [10:Procedure was tolerated well] [9:N/A] [N/A:N/A] Debridement Treatment Response: [10:3x4x0.5] [9:N/A] [N/A:N/A] Post Debridement Measurements L x W x D (cm) [10:4.712] [9:N/A] [N/A:N/A] Post Debridement Volume: (cm) [10:Debridement] [9:N/A] [N/A:N/A] Treatment Notes Electronic Signature(s) Signed: 01/28/2021 5:01:47 PM By: Linton Ham MD Signed: 01/28/2021 5:18:29 PM By: Lorrin Jackson Entered By: Linton Ham on 01/28/2021 16:37:24 -------------------------------------------------------------------------------- Multi-Disciplinary Care Plan Details Patient Name: Date of Service: [name], [name] 01/28/2021 2:15 PM Medical Record Number: [id number] Patient Account Number: [id number] Date of Birth/Sex: Treating RN: 02/26/1956 (65 y.o. Sue Lush Primary Care Jamil Armwood: [name] Other Clinician: Referring Jamille Fisher: Treating Erianna Jolly/Extender: [name] in Treatment: 2 Active Inactive Pressure Nursing Diagnoses: Knowledge deficit related to management of pressures ulcers Goals: Patient/caregiver will verbalize understanding of pressure ulcer management Date Initiated: 01/10/2021 Target Resolution Date: 02/14/2021 Goal Status: Active Interventions: Assess offloading  mechanisms upon admission and as needed Assess potential for pressure ulcer upon admission and as needed Provide education on pressure ulcers Notes: Wound/Skin Impairment Nursing Diagnoses: Impaired tissue integrity Goals: Patient/caregiver will verbalize understanding of skin care regimen Date Initiated: 01/10/2021 Target Resolution Date: 02/14/2021 Goal Status: Active Ulcer/skin breakdown will have a volume reduction of 30% by week 4 Date Initiated: 01/10/2021 Target Resolution Date: 02/14/2021 Goal Status: Active Interventions: Assess patient/caregiver ability to obtain necessary supplies Assess patient/caregiver ability to perform ulcer/skin care regimen upon admission and as needed Assess ulceration(s) every visit Provide education on ulcer and skin care Treatment Activities: Skin care regimen initiated : 01/10/2021 Topical wound management initiated : 01/10/2021 Notes: Electronic Signature(s) Signed: 01/28/2021 3:13:47 PM By: Lorrin Jackson Entered By: Lorrin Jackson on 01/28/2021 15:13:47 -------------------------------------------------------------------------------- Pain Assessment Details Patient Name: Date of Service: [name], [name] 01/28/2021 2:15 PM Medical Record Number: [id number] Patient Account Number: [id number] Date of Birth/Sex: Treating RN: 07/10/1956 (65 y.o. [name] Primary Care Jasiri Hanawalt: [name] Other Clinician: Referring Kadey Mihalic: Treating Kal Chait/Extender: [name] in Treatment: 2 Active Problems Location of Pain Severity and Description of Pain Patient Has Paino Yes Site Locations Pain Location: Pain Location: Generalized Pain With Dressing Change: No Duration of the Pain. Constant / Intermittento Constant Rate the pain. Current Pain Level: 7 Worst Pain Level: 3 Character of Pain Describe the Pain: Aching Pain Management and  Medication Current Pain Management: Notes reports pain  with knidneys Electronic Signature(s) Signed: 01/28/2021 5:19:58 PM By: Baruch Gouty RN, BSN Entered By: Baruch Gouty on 01/28/2021 14:52:03 -------------------------------------------------------------------------------- Patient/Caregiver Education Details Patient Name: Date of Service: [name], [name] 4/1/2022andnbsp2:15 PM Medical Record Number: [id number] Patient Account Number: [id number] Date of Birth/Gender: Treating RN: [birth year] (65 y.o. Sue Lush Primary Care Physician: [name] Other Clinician: Referring Physician: Treating Physician/Extender: [name] in Treatment: 2 Education Assessment Education Provided To: Patient Education Topics Provided Offloading: Methods: Explain/Verbal, Printed Responses: State content correctly Pressure: Methods: Explain/Verbal, Printed Responses: State content correctly Wound/Skin Impairment: Methods: Explain/Verbal, Printed Responses: State content correctly Electronic Signature(s) Signed: 01/28/2021 5:18:29 PM By: Lorrin Jackson Entered By: Lorrin Jackson on 01/28/2021 15:23:28 -------------------------------------------------------------------------------- Wound Assessment Details Patient Name: Date of Service: [name], [name] 01/28/2021 2:15 PM Medical Record Number: [id number] Patient Account Number: [id number] Date of Birth/Sex: Treating RN: 1956-07-23 (65 y.o. [name] Primary Care Lorien Shingler: [name] Other Clinician: Referring Deigo Alonso: Treating Netanel Yannuzzi/Extender: [name] in Treatment: 2 Wound Status Wound Number: 10 Primary Diabetic Wound/Ulcer of the Lower Extremity Etiology: Wound Location: Right, Lateral Foot Wound Open Wounding Event: Gradually Appeared Status: Date Acquired: 09/29/2020 Comorbid Cataracts, Anemia, Sleep Apnea, Coronary Artery Disease, Weeks Of Treatment: 2 History: Hypertension,  Myocardial Infarction, Type II Diabetes, Clustered Wound: No Osteoarthritis, Osteomyelitis, Neuropathy Photos Wound Measurements Length: (cm) 3 Width: (cm) 4 Depth: (cm) 0.5 Area: (cm) 9.425 Volume: (cm) 4.712 % Reduction in Area: 11.1% % Reduction in Volume: 68.3% Epithelialization: Small (1-33%) Tunneling: No Undermining: No Wound Description Classification: Grade 2 Wound Margin: Distinct, outline attached Exudate Amount: Medium Exudate Type: Serosanguineous Exudate Color: red, brown Foul Odor After Cleansing: No Slough/Fibrino Yes Wound Bed Granulation Amount: Large (67-100%) Exposed Structure Granulation Quality: Red, Pink, Pale Fascia Exposed: No Necrotic Amount: Small (1-33%) Fat Layer (Subcutaneous Tissue) Exposed: Yes Necrotic Quality: Adherent Slough Tendon Exposed: No Muscle Exposed: No Joint Exposed: No Bone Exposed: No Treatment Notes Wound #10 (Foot) Wound Laterality: Right, Lateral Cleanser Soap and Water Discharge Instruction: May shower and wash wound with dial antibacterial soap and water prior to dressing change. Peri-Wound Care Topical Primary Dressing KerraCel Ag Gelling Fiber Dressing, 4x5 in (silver alginate) Discharge Instruction: Apply silver alginate to wound bed as instructed Secondary Dressing Woven Gauze Sponge, Non-Sterile 4x4 in Discharge Instruction: Apply over primary dressing as directed. ABD Pad, 5x9 Discharge Instruction: Apply over primary dressing as directed. Secured With [employer], 4.5x3.1 (in/yd) Discharge Instruction: Secure with Kerlix as directed. Transpore Surgical Tape, 2x10 (in/yd) Discharge Instruction: Secure dressing with tape as directed. Compression Wrap Compression Stockings Add-Ons Electronic Signature(s) Signed: 01/28/2021 5:19:58 PM By: Baruch Gouty RN, BSN Signed: 01/31/2021 10:23:55 AM By: [name] Entered By: [name] on 01/28/2021  16:37:17 -------------------------------------------------------------------------------- Wound Assessment Details Patient Name: Date of Service: [place], [name] 01/28/2021 2:15 PM Medical Record Number: [id number] Patient Account Number: [id number] Date of Birth/Sex: Treating RN: 1955/11/24 (65 y.o. [name] Primary Care Jackelyn Illingworth: [name] Other Clinician: Referring Briella Hobday: Treating Jermario Kalmar/Extender: [name] in Treatment: 2 Wound Status Wound Number: 9 Primary Diabetic Wound/Ulcer of the Lower Extremity Etiology: Wound Location: Left Calcaneus Wound Open Wounding Event: Gradually Appeared Status: Date Acquired: 01/11/2019 Comorbid Cataracts, Anemia, Sleep Apnea, Coronary Artery Disease, Weeks Of Treatment: 2 History: Hypertension, Myocardial Infarction, Type II Diabetes, Clustered Wound: No Osteoarthritis, Osteomyelitis, Neuropathy Photos Wound Measurements Length: (cm) 5  Width: (cm) 4.2 Depth: (cm) 1 Area: (cm) 16.493 Volume: (cm) 16.493 % Reduction in Area: -41.9% % Reduction in Volume: 35.5% Epithelialization: Small (1-33%) Tunneling: No Undermining: No Wound Description Classification: Grade 2 Wound Margin: Distinct, outline attached Exudate Amount: Medium Exudate Type: Serosanguineous Exudate Color: red, brown Foul Odor After Cleansing: No Slough/Fibrino Yes Wound Bed Granulation Amount: Large (67-100%) Exposed Structure Granulation Quality: Red, Pink, Pale Fascia Exposed: No Necrotic Amount: Small (1-33%) Fat Layer (Subcutaneous Tissue) Exposed: Yes Necrotic Quality: Adherent Slough Tendon Exposed: No Muscle Exposed: No Joint Exposed: No Bone Exposed: No Treatment Notes Wound #9 (Calcaneus) Wound Laterality: Left Cleanser Soap and Water Discharge Instruction: May shower and wash wound with dial antibacterial soap and water prior to dressing change. Peri-Wound Care Topical Primary  Dressing KerraCel Ag Gelling Fiber Dressing, 4x5 in (silver alginate) Discharge Instruction: Apply silver alginate to wound bed as instructed Secondary Dressing Woven Gauze Sponge, Non-Sterile 4x4 in Discharge Instruction: Apply over primary dressing as directed. ABD Pad, 5x9 Discharge Instruction: Apply over primary dressing as directed. Secured With [employer], 4.5x3.1 (in/yd) Discharge Instruction: Secure with Kerlix as directed. Transpore Surgical Tape, 2x10 (in/yd) Discharge Instruction: Secure dressing with tape as directed. Compression Wrap Compression Stockings Add-Ons Electronic Signature(s) Signed: 01/28/2021 5:19:58 PM By: Baruch Gouty RN, BSN Signed: 01/31/2021 10:23:55 AM By: [name] Entered By: [name] on 01/28/2021 16:37:45 -------------------------------------------------------------------------------- Vitals Details Patient Name: Date of Service: [name], [name] 01/28/2021 2:15 PM Medical Record Number: [id number] Patient Account Number: [id number] Date of Birth/Sex: Treating RN: 1956-08-26 (65 y.o. [name] Primary Care Dashayla Theissen: [name] Other Clinician: Referring Henley Boettner: Treating Jiovani Mccammon/Extender: [name] in Treatment: 2 Vital Signs Time Taken: 14:50 Temperature (F): 97.5 Height (in): 61 Pulse (bpm): 82 Source: Stated Respiratory Rate (breaths/min): 18 Weight (lbs): 162 Blood Pressure (mmHg): 171/94 Source: Stated Capillary Blood Glucose (mg/dl): 114 Body Mass Index (BMI): 30.6 Reference Range: 80 - 120 mg / dl Notes glucose per pt report this am Electronic Signature(s) Signed: 01/28/2021 5:19:58 PM By: Baruch Gouty RN, BSN Entered By: Baruch Gouty on 01/28/2021 14:50:52

## 2021-02-01 ENCOUNTER — Other Ambulatory Visit: Payer: Self-pay

## 2021-02-01 ENCOUNTER — Ambulatory Visit (INDEPENDENT_AMBULATORY_CARE_PROVIDER_SITE_OTHER): Payer: PRIVATE HEALTH INSURANCE | Admitting: Family Medicine

## 2021-02-01 VITALS — BP 142/80 | HR 83 | Temp 98.4°F | Resp 18

## 2021-02-01 DIAGNOSIS — N184 Chronic kidney disease, stage 4 (severe): Secondary | ICD-10-CM

## 2021-02-01 DIAGNOSIS — J301 Allergic rhinitis due to pollen: Secondary | ICD-10-CM

## 2021-02-01 MED ORDER — AZELASTINE HCL 0.1 % NA SOLN: 1.0000 | Freq: Two times a day (BID) | NASAL | 12 refills | Status: DC

## 2021-02-01 MED ORDER — FLUTICASONE PROPIONATE 50 MCG/ACT NA SUSP: 2.0000 | Freq: Every day | NASAL | 6 refills | Status: DC

## 2021-02-01 NOTE — Progress Notes (Signed)
[name], adult    DOB: [birth year]  Age: 65 y.o. MRN: [id number]    Subjective:  Subjective  HPI [name] [name] presents for seasonal allergies --- she has a hx of this   She is not taking anything otc   No fevers, no sinus pressure  She also needs her kidney function repeated .     No other complaints  Review of Systems  Constitutional: Negative for appetite change, diaphoresis, fatigue and unexpected weight change.  HENT: Positive for congestion, postnasal drip, rhinorrhea and sneezing. Negative for ear pain, sinus pressure, sinus pain, sore throat, tinnitus, trouble swallowing and voice change.   Eyes: Negative for pain, redness and visual disturbance.  Respiratory: Negative for cough, chest tightness, shortness of breath and wheezing.   Cardiovascular: Negative for chest pain, palpitations and leg swelling.  Endocrine: Negative for cold intolerance, heat intolerance, polydipsia, polyphagia and polyuria.  Genitourinary: Negative for difficulty urinating, dysuria and frequency.  Neurological: Negative for dizziness, light-headedness, numbness and headaches.    History Past Medical History:  Diagnosis Date  . Acute MI [facility]) 2007   presented to ED & had cardiac cath- but found to have normal coronaries. Since that point in time her PCP cares f or cardiac needs. Dr. [name] - [facility]  . Anemia   . Anginal pain ([place])   . Anxiety   . Asthma   . Back pain 11/17/2019  . Bulging lumbar disc   . CAD (coronary artery disease) 01/11/2012  . Cataract   . 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 ([place]) 03/16/2020  . 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 Cirrhosis of liver without mention of alcohol   . Constipation   . Dehiscence of closure of skin    left partial calcaneal excision  . Depression   . Diabetes mellitus    insulin dependent, adult onset  . Diarrhea 09/04/2019  .  Episode of visual loss of left eye   . Essential hypertension 12/23/2006   Qualifier: Diagnosis of  By: Larose Kells MD, [place] Exertional shortness of breath   . Fatty liver   . Fibromyalgia   . GERD (gastroesophageal reflux disease)   . Hepatic steatosis   . High cholesterol   . History of MI (myocardial infarction) 10/06/2013  . Hyperlipidemia 06/03/2008   Qualifier: Diagnosis of  By: Larose Kells MD, [place] Hypertension   . MRSA (methicillin resistant Staphylococcus aureus)   . Neuropathy    lower legs  . Osteoarthritis    hands, hips  . Proximal humerus fracture 10/15/12   Left  . PTSD (post-traumatic stress disorder)   . Renal insufficiency 05/05/2015  . 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 Sleep apnea 11/16/2017  . SOB (shortness of breath) 10/11/2020  . 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   Qualifier: Diagnosis of  By: Charlott Holler CMA, [place]    . 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); Cardiac catheterization (2007); Skin graft split thickness leg / foot (Right, 03/19/2013); Cholecystectomy (1995); Abdominal hysterectomy (1979); Dilation and curettage of uterus (1977); I & D extremity (Right, 03/19/2013); I & D extremity (Left,  09/08/2016); I & D extremity (Left, 09/29/2016); Incision and drainage (Right, 04/17/2019); IR US Guide Vasc Access Right (04/21/2019); IR Fluoro Guide CV Line Right (04/21/2019); IR Fluoro Guide CV Line Right (08/28/2019); IR US Guide Vasc Access Right (08/28/2019); IR Fluoro Guide CV Line Right (11/04/2019); IR Removal Tun Cv Cath W/O FL (11/18/2019); Esophagogastroduodenoscopy (egd) with propofol (N/A, 02/18/2020); biopsy (02/18/2020); IR US Guide Vasc Access  Right (02/25/2020); IR Fluoro Guide CV Line Right (02/25/2020); IR Removal Tun Cv Cath W/O FL (02/26/2020); Anterior lat lumbar fusion (N/A, 01/22/2020); Posterior lumbar fusion 4 level (N/A, 04/17/5092); and Application of robotic assistance for spinal procedure (N/A, 01/22/2020).   Her family history includes Cancer in her father; Colitis in her father; Crohn's disease in her father; Diabetes in her father and mother; Diabetes Mellitus I in her brother; Diabetes Mellitus II in her brother, brother, brother, and brother; Heart attack in her brother; Heart disease in her brother, brother, and father; Hypertension in her mother; Irritable bowel syndrome in her daughter; Kidney disease in her brother, brother, brother, brother, and brother; Leukemia in her father; Liver disease in her brother and brother; Mental illness in her mother.She reports that she has never smoked. She has never used smokeless tobacco. She reports that she does not drink alcohol and does not use drugs.  Current Outpatient Medications on File Prior to Visit  Medication Sig Dispense Refill  . acetaminophen (TYLENOL) 325 MG tablet Take 2 tablets (650 mg total) by mouth every 6 (six) hours as needed for fever.    [name] Kitchen atorvastatin (LIPITOR) 10 MG tablet Take 1 tablet by mouth once daily 90 tablet 1  . Ciclopirox 1 % shampoo Massage in scalp and let sit 3 min and rinse 2 x weekly 120 mL 0  . Continuous Blood Gluc Receiver (FREESTYLE LIBRE 14 DAY READER) DEVI 1 Device by Does not apply route daily. 2 each 6  . Continuous Blood Gluc Sensor (FREESTYLE LIBRE 14 DAY SENSOR) MISC 1 Device by Does not apply route daily. 2 each 6  . diazepam (VALIUM) 5 MG tablet Take 1 tablet (5 mg total) by mouth every 12 (twelve) hours as needed for anxiety. 30 tablet 0  . DULoxetine (CYMBALTA) 60 MG capsule Take 1 capsule (60 mg total) by mouth daily. 90 capsule 3  . folic acid (FOLVITE) 1 MG tablet Take 2 tablets (2 mg total) by mouth daily. 60 tablet 6  .  furosemide (LASIX) 20 MG tablet Take 20 mg by mouth daily.    [name] Kitchen HUMALOG KWIKPEN 200 UNIT/ML KwikPen 15 units sub-q twice daily    . HYDROcodone-acetaminophen (NORCO/VICODIN) 5-325 MG tablet Take 1 tablet by mouth every 6 (six) hours as needed for moderate pain. 120 tablet 0  . insulin glargine (LANTUS SOLOSTAR) 100 UNIT/ML Solostar Pen Injection 20 units in the morning and 40 units at night 15 mL 1  . lidocaine (LIDODERM) 5 % Place 1 patch onto the skin daily. Remove & Discard patch within 12 hours or as directed by MD 30 patch 0  . Multiple Vitamin (MULTIVITAMIN WITH MINERALS) TABS tablet Take 1 tablet by mouth daily.    . multivitamin (VIT W/EXTRA C) CHEW chewable tablet Chew 1 tablet by mouth daily.    . naftifine (NAFTIN) 1 % cream Apply topically daily. 30 g 0  . NYSTATIN powder APPLY  POWDER TOPICALLY THREE TIMES DAILY 15 g 0  . pregabalin (LYRICA) 75 MG capsule Take 1 capsule (75 mg total) by mouth 3 (three) times daily. [place]  capsule 1  . rivaroxaban (XARELTO) 2.5 MG TABS tablet Take by mouth.    . [DISCONTINUED] metFORMIN (GLUCOPHAGE) 1000 MG tablet Take 1,000 mg by mouth 2 (two) times daily with a meal.      . [DISCONTINUED] omeprazole (PRILOSEC) 20 MG capsule Take 20 mg by mouth daily.       No current facility-administered medications on file prior to visit.     Objective:  Objective  Physical Exam Vitals and nursing note reviewed.  Constitutional:      Appearance: She is well-developed.  HENT:     Right Ear: External ear normal.     Left Ear: External ear normal.     Nose: Congestion and rhinorrhea present.  Eyes:     General:        Right eye: No discharge.        Left eye: No discharge.     Conjunctiva/sclera: Conjunctivae normal.  Cardiovascular:     Rate and Rhythm: Normal rate and regular rhythm.     Heart sounds: Normal heart sounds. No murmur heard.   Pulmonary:     Effort: Pulmonary effort is normal. No respiratory distress.     Breath sounds: Normal breath  sounds. No wheezing or rales.  Chest:     Chest wall: No tenderness.  Lymphadenopathy:     Cervical: No cervical adenopathy.  Neurological:     Mental Status: She is alert and oriented to person, place, and time.    BP (!) 142/80 (BP Location: Left Arm, Patient Position: Sitting, Cuff Size: Normal)   Pulse 83   Temp 98.4 F (36.9 C) (Oral)   Resp 18   SpO2 100%  Wt Readings from Last 3 Encounters:  01/25/21 165 lb (74.8 kg)  11/23/20 169 lb (76.7 kg)  11/23/20 169 lb (76.7 kg)     Lab Results  Component Value Date   WBC 5.5 01/25/2021   HGB 9.0 (L) 01/25/2021   HCT 29.2 (L) 01/25/2021   PLT 158 01/25/2021   GLUCOSE 101 (H) 02/01/2021   CHOL 135 06/24/2020   TRIG 151 (H) 06/24/2020   HDL 31 (L) 06/24/2020   LDLDIRECT 115.8 01/23/2012   LDLCALC 79 06/24/2020   ALT 7 02/01/2021   AST 13 02/01/2021   NA 136 02/01/2021   K 5.6 No hemolysis seen (H) 02/01/2021   CL 105 02/01/2021   CREATININE 2.96 (H) 02/01/2021   BUN 47 (H) 02/01/2021   CO2 24 02/01/2021   TSH 1.65 01/03/2016   INR 1.2 01/22/2020   HGBA1C 8.1 (H) 06/24/2020   MICROALBUR 26.3 (H) 11/26/2018    DG Foot Complete Left  Result Date: 01/18/2021 CLINICAL DATA:  Left foot ulcer. EXAM: LEFT FOOT - COMPLETE 3+ VIEW COMPARISON:  June 21, 2019. FINDINGS: Postsurgical changes are seen involving the posterior calcaneus. No acute fracture or dislocation is noted. 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 No definite evidence of acute osteomyelitis is noted. Large ulceration is seen involving the posterior plantar tissues. IMPRESSION: Large ulceration is seen involving the posterior plantar tissues. No definite evidence of acute osteomyelitis. No acute fracture or dislocation is noted. Postsurgical changes are seen involving the posterior calcaneus. Electronically Signed   By: Marijo Conception M.D.   On: 01/18/2021 14:21   DG Foot Complete Right  Result Date:  01/18/2021 CLINICAL DATA:  Diabetic foot ulcer EXAM: RIGHT FOOT COMPLETE - 3+ VIEW COMPARISON:  02/03/2013 FINDINGS: Interval amputation of the fifth  ray. There is soft tissue ulceration lateral to the metatarsophalangeal joint. There is a small nubbin of residual fifth metatarsal proximally. Negative for osteomyelitis.  Negative for fracture. IMPRESSION: Negative for osteomyelitis. Postop fifth ray amputation. Lateral foot ulcer. Electronically Signed   By: Franchot Gallo M.D.   On: 01/18/2021 14:20     Assessment & Plan:  Plan  I am having [name] [name] start on fluticasone and azelastine. I am also having her maintain her atorvastatin, lidocaine, acetaminophen, multivitamin with minerals, FreeStyle Libre 14 Day Reader, FreeStyle Libre 14 Day Sensor, DULoxetine, pregabalin, diazepam, HumaLOG KwikPen, nystatin, Ciclopirox, naftifine, multivitamin, furosemide, Lantus SoloStar, HYDROcodone-acetaminophen, Xarelto, and folic acid.  Meds ordered this encounter  Medications  . fluticasone (FLONASE) 50 MCG/ACT nasal spray    Sig: Place 2 sprays into both nostrils daily.    Dispense:  16 g    Refill:  6  . azelastine (ASTELIN) 0.1 % nasal spray    Sig: Place 1 spray into both nostrils 2 (two) times daily. Use in each nostril as directed    Dispense:  30 mL    Refill:  12    Problem List Items Addressed This Visit      Unprioritized   CRF (chronic renal failure), stage 4 (severe) ([place])    Recheck labs today  F/u nephrology       Relevant Orders   Comprehensive metabolic panel (Completed)   Seasonal allergic rhinitis due to pollen - Primary    Avoid antihistamines due to kidney function  Use flonase and astelin and f/u prn       Relevant Medications   fluticasone (FLONASE) 50 MCG/ACT nasal spray   azelastine (ASTELIN) 0.1 % nasal spray      Follow-up: Return in about 6 months (around 08/03/2021), or if symptoms worsen or fail to improve.  Ann Held, DO

## 2021-02-01 NOTE — Patient Instructions (Signed)
Allergies, Adult An allergy is a condition in which the body's defense system (immune system) comes in contact with an allergen and reacts to it. An allergen is anything that causes an allergic reaction. Allergens cause the immune system to make proteins for fighting infections (antibodies). These antibodies cause cells to release chemicals called histamines that set off the symptoms of an allergic reaction. Allergies often affect the nasal passages (allergic rhinitis), eyes (allergic conjunctivitis), skin (atopic dermatitis), and stomach. Allergies can be mild, moderate, or severe. They cannot spread from person to person. Allergies can develop at any age and may be outgrown. What are the causes? This condition is caused by allergens. Common allergens include:  Outdoor allergens, such as pollen, car fumes, and mold.  Indoor allergens, such as dust, smoke, mold, and pet dander.  Other allergens, such as foods, medicines, scents, insect bites or stings, and other skin irritants. What increases the risk? You are more likely to develop this condition if you have:  Family members with allergies.  Family members who have any condition that may be caused by allergens, such as asthma. This may make you more likely to have other allergies. What are the signs or symptoms? Symptoms of this condition depend on the severity of the allergy. Mild to moderate symptoms  Runny nose, stuffy nose (nasal congestion), or sneezing.  Itchy mouth, ears, or throat.  A feeling of mucus dripping down the back of your throat (postnasal drip).  Sore throat.  Itchy, red, watery, or puffy eyes.  Skin rash, or itchy, red, swollen areas of skin (hives).  Stomach cramps or bloating. Severe symptoms Severe allergies to food, medicine, or insect bites may cause anaphylaxis, which can be life-threatening. Symptoms include:  A red (flushed) face.  Wheezing or coughing.  Swollen lips, tongue, or mouth.  Tight or  swollen throat.  Chest pain or tightness, or rapid heartbeat.  Trouble breathing or shortness of breath.  Pain in the abdomen, vomiting, or diarrhea.  Dizziness or fainting. How is this diagnosed? This condition is diagnosed based on your symptoms, your family and medical history, and a physical exam. You may also have tests, including:  Skin tests to see how your skin reacts to allergens that may be causing your symptoms. Tests include: ? Skin prick test. For this test, an allergen is introduced to your body through a small opening in the skin. ? Intradermal skin test. For this test, a small amount of allergen is injected under the first layer of your skin. ? Patch test. For this test, a small amount of allergen is placed on your skin. The area is covered and then checked after a few days.  Blood tests.  A challenge test. For this test, you will eat or breathe in a small amount of allergen to see if you have an allergic reaction. You may also be asked to:  Keep a food diary. This is a record of all the foods, drinks, and symptoms you have in a day.  Try an elimination diet. To do this: ? Remove certain foods from your diet. ? Add those foods back one by one to find out if any foods cause an allergic reaction. How is this treated? Treatment for allergies depends on your symptoms. Treatment may include:  Cold, wet cloths (cold compresses) to soothe itching and swelling.  Eye drops or nasal sprays.  Nasal irrigation to help clear your mucus or keep the nasal passages moist.  A humidifier to add moisture to the  air.  Skin creams to treat rashes or itching.  Oral antihistamines or other medicines to block the reaction or to treat inflammation.  Diet changes to remove foods that cause allergies.  Being exposed again and again to tiny amounts of allergens to help you build a defense against it (tolerance). This is called immunotherapy. Examples include: ? Allergy shot. You  receive an injection that contains an allergen. ? Sublingual immunotherapy. You take a small dose of allergen under your tongue.  Emergency injection for anaphylaxis. You give yourself a shot using a syringe (auto-injector) that contains the amount of medicine you need. Your health care provider will teach you how to give yourself an injection.      Follow these instructions at home: Medicines  Take or apply over-the-counter and prescription medicines only as told by your health care provider.  Always carry your auto-injector pen if you are at risk of anaphylaxis. Give yourself an injection as told by your health care provider.   Eating and drinking  Follow instructions from your health care provider about eating or drinking restrictions.  Drink enough fluid to keep your urine pale yellow. General instructions  Wear a medical alert bracelet or necklace to let others know that you have had anaphylaxis before.  Avoid known allergens whenever possible.  Keep all follow-up visits as told by your health care provider. This is important. Contact a health care provider if:  Your symptoms do not get better with treatment. Get help right away if:  You have symptoms of anaphylaxis. These include: ? Swollen mouth, tongue, or throat. ? Pain or tightness in your chest. ? Trouble breathing or shortness of breath. ? Dizziness or fainting. ? Severe abdominal pain, vomiting, or diarrhea. These symptoms may represent a serious problem that is an emergency. Do not wait to see if the symptoms will go away. Get medical help right away. Call your local emergency services (911 in the [place] Do not drive yourself to the hospital. Summary  Take or apply over-the-counter and prescription medicines only as told by your health care provider.  Avoid known allergens when possible.  Always carry your auto-injector pen if you are at risk of anaphylaxis. Give yourself an injection as told by your health  care provider.  Wear a medical alert bracelet or necklace to let others know that you have had anaphylaxis before.  Anaphylaxis is a life-threatening emergency. Get help right away. This information is not intended to replace advice given to you by your health care provider. Make sure you discuss any questions you have with your health care provider. Document Revised: 06/14/2020 Document Reviewed: 08/27/2019 Elsevier Patient Education  2021 [employer].

## 2021-02-02 ENCOUNTER — Ambulatory Visit (HOSPITAL_COMMUNITY): Payer: PRIVATE HEALTH INSURANCE

## 2021-02-02 ENCOUNTER — Encounter: Payer: Self-pay | Admitting: Family Medicine

## 2021-02-02 DIAGNOSIS — J301 Allergic rhinitis due to pollen: Secondary | ICD-10-CM | POA: Insufficient documentation

## 2021-02-02 DIAGNOSIS — N184 Chronic kidney disease, stage 4 (severe): Secondary | ICD-10-CM | POA: Insufficient documentation

## 2021-02-02 LAB — COMPREHENSIVE METABOLIC PANEL
ALT: 7 U/L (ref 0–35)
AST: 13 U/L (ref 0–37)
Albumin: 3.4 g/dL — ABNORMAL LOW (ref 3.5–5.2)
Alkaline Phosphatase: 92 U/L (ref 39–117)
BUN: 47 mg/dL — ABNORMAL HIGH (ref 6–23)
CO2: 24 mEq/L (ref 19–32)
Calcium: 8.8 mg/dL (ref 8.4–10.5)
Chloride: 105 mEq/L (ref 96–112)
Creatinine, Ser: 2.96 mg/dL — ABNORMAL HIGH (ref 0.40–1.20)
GFR: 16.13 mL/min — ABNORMAL LOW (ref >60.00)
Glucose, Bld: 101 mg/dL — ABNORMAL HIGH (ref 70–99)
Potassium: 5.6 mEq/L — ABNORMAL HIGH (ref 3.5–5.1)
Sodium: 136 mEq/L (ref 135–145)
Total Bilirubin: 0.3 mg/dL (ref 0.2–1.2)
Total Protein: 7.5 g/dL (ref 6.0–8.3)

## 2021-02-02 NOTE — Assessment & Plan Note (Signed)
Avoid antihistamines due to kidney function  Use flonase and astelin and f/u prn

## 2021-02-02 NOTE — Assessment & Plan Note (Signed)
Recheck labs today  F/u nephrology

## 2021-02-04 ENCOUNTER — Other Ambulatory Visit: Payer: Self-pay

## 2021-02-04 ENCOUNTER — Encounter (HOSPITAL_BASED_OUTPATIENT_CLINIC_OR_DEPARTMENT_OTHER): Payer: PRIVATE HEALTH INSURANCE | Admitting: Internal Medicine

## 2021-02-04 DIAGNOSIS — E11621 Type 2 diabetes mellitus with foot ulcer: Secondary | ICD-10-CM | POA: Diagnosis not present

## 2021-02-04 NOTE — Progress Notes (Addendum)
[name] [name], [name] [name] ([id number]) Visit Report for 02/04/2021 Arrival Information Details Patient Name: Date of Service: [name] [name] [name], [place] [name] R. 02/04/2021 3:00 [name] Medical Record Number: [id number] Patient Account Number: [id number] Date of Birth/Sex: Treating RN: May 11, 1956 (65 y.o. [name] [name] Primary Care [name] [name]: [name] [name] Other Clinician: Referring [name] [name]: Treating [name] [name]/Extender: [name] [name] in [name]: 3 Visit Information History Since Last Visit Added or deleted any medications: No Patient Arrived: Other Any new allergies or adverse reactions: No Arrival Time: 15:20 Had a fall or experienced change in No Accompanied By: self activities of daily living that may affect Transfer Assistance: None risk of falls: Patient Identification Verified: Yes Signs or symptoms of abuse/neglect since last visito No Secondary Verification Process Completed: Yes Hospitalized since last visit: No Patient Requires Transmission-Based Precautions: No Implantable device outside of the clinic excluding No Patient Has Alerts: No cellular tissue based products placed in the center since last visit: Has Dressing in Place as Prescribed: Yes Pain Present Now: Yes Electronic Signature(s) Signed: 03/21/2021 2:30:03 [name] By: [name] [name] Previous Signature: 02/07/2021 7:58:59 AM Version By: [name] [name] Entered By: [name] [name] on 03/21/2021 14:30:02 -------------------------------------------------------------------------------- Clinic Level of Care Assessment Details Patient Name: Date of Service: [name] [name] Medical Record Number: [id number] Patient Account Number: [id number] Date of Birth/Sex: Treating RN: [birth year] (65 y.o. [name] [name] Primary Care [name] [name]: [name] [name] Other Clinician: Referring Bentzion Dauria: Treating [name] [name]/Extender: [name] [name]: 3 Clinic Level  of Care Assessment Items TOOL 4 Quantity Score X- 1 0 Use when only an EandM is performed on FOLLOW-UP visit ASSESSMENTS - Nursing Assessment / Reassessment X- 1 10 Reassessment of Co-morbidities (includes updates in patient status) X- 1 5 Reassessment of Adherence to [name] Plan ASSESSMENTS - Wound and Skin A ssessment / Reassessment []  - 0 Simple Wound Assessment / Reassessment - one wound X- 2 5 Complex Wound Assessment / Reassessment - multiple wounds []  - 0 Dermatologic / Skin Assessment (not related to wound area) ASSESSMENTS - Focused Assessment []  - 0 Circumferential Edema Measurements - multi extremities []  - 0 Nutritional Assessment / Counseling / Intervention []  - 0 Lower Extremity Assessment (monofilament, tuning fork, pulses) []  - 0 Peripheral Arterial Disease Assessment (using hand held doppler) ASSESSMENTS - Ostomy and/or Continence Assessment and Care []  - 0 Incontinence Assessment and Management []  - 0 Ostomy Care Assessment and Management (repouching, etc.) PROCESS - Coordination of Care []  - 0 Simple Patient / Family Education for ongoing care X- 1 20 Complex (extensive) Patient / Family Education for ongoing care X- 1 10 Staff obtains Programmer, systems, Records, T Results / Process Orders est []  - 0 Staff telephones HHA, Nursing Homes / Clarify orders / etc []  - 0 Routine Transfer to another Facility (non-emergent condition) []  - 0 Routine Hospital Admission (non-emergent condition) []  - 0 New Admissions / Biomedical engineer / Ordering NPWT Apligraf, etc. , []  - 0 Emergency Hospital Admission (emergent condition) []  - 0 Simple Discharge Coordination []  - 0 Complex (extensive) Discharge Coordination PROCESS - Special Needs []  - 0 Pediatric / Minor Patient Management []  - 0 Isolation Patient Management []  - 0 Hearing / Language / Visual special needs []  - 0 Assessment of Community assistance (transportation, D/C planning, etc.) []  -  0 Additional assistance / Altered mentation []  - 0 Support Surface(s) Assessment (bed, cushion, seat, etc.) INTERVENTIONS - Wound Cleansing / Measurement []  - 0 Simple Wound Cleansing - one wound X-  2 5 Complex Wound Cleansing - multiple wounds X- 1 5 Wound Imaging (photographs - any number of wounds) []  - 0 Wound Tracing (instead of photographs) []  - 0 Simple Wound Measurement - one wound X- 2 5 Complex Wound Measurement - multiple wounds INTERVENTIONS - Wound Dressings []  - 0 Small Wound Dressing one or multiple wounds X- 2 15 Medium Wound Dressing one or multiple wounds []  - 0 Large Wound Dressing one or multiple wounds X- 1 5 Application of Medications - topical []  - 0 Application of Medications - injection INTERVENTIONS - Miscellaneous []  - 0 External ear exam []  - 0 Specimen Collection (cultures, biopsies, blood, body fluids, etc.) []  - 0 Specimen(s) / Culture(s) sent or taken to Lab for analysis []  - 0 Patient Transfer (multiple staff / Civil Service fast streamer / Similar devices) []  - 0 Simple Staple / Suture removal (25 or less) []  - 0 Complex Staple / Suture removal (26 or more) []  - 0 Hypo / Hyperglycemic Management (close monitor of Blood Glucose) []  - 0 Ankle / Brachial Index (ABI) - do not check if billed separately X- 1 5 Vital Signs Has the patient been seen at the hospital within the last three years: Yes Total Score: 120 Level Of Care: New/Established - Level 4 Electronic Signature(s) Signed: 02/04/2021 5:03:53 [name] By: [name] [name] Entered By: [name] [name] on 02/04/2021 16:02:29 -------------------------------------------------------------------------------- Encounter Discharge Information Details Patient Name: Date of Service: [name] [name] Medical Record Number: [id number] Patient Account Number: [id number] Date of Birth/Sex: Treating RN: 15-Jan-1956 (65 y.o. [name] [name], [name] [name] Primary Care [name] [name]: [name] [name] Other  Clinician: Referring [name] [name]: Treating [name] [name]/Extender: [name] [name]: 3 Encounter Discharge Information Items Discharge Condition: Stable Ambulatory Status: Ambulatory Discharge Destination: Home Transportation: Private Auto Accompanied By: self Schedule Follow-up Appointment: Yes Clinical Summary of Care: Patient Declined Electronic Signature(s) Signed: 02/07/2021 5:44:40 [name] By: Rhae Hammock RN Entered By: Rhae Hammock on 02/04/2021 16:00:02 -------------------------------------------------------------------------------- Lower Extremity Assessment Details Patient Name: Date of Service: [name] [name] Medical Record Number: [id number] Patient Account Number: [id number] Date of Birth/Sex: Treating RN: 11-Nov-1955 (65 y.o. [name] [name], [name] [name] Primary Care Hridaan Bouse: [name] [name] Other Clinician: Referring Jaxen Samples: Treating Kolston Lacount/Extender: [name] [name]: 3 Edema Assessment Assessed: Shirlyn Goltz: Yes] Patrice Paradise: Yes] Edema: [Left: Yes] [Right: Yes] Calf Left: Right: Point of Measurement: 35 cm From Medial Instep 33.5 cm 35.4 cm Ankle Left: Right: Point of Measurement: 8 cm From Medial Instep 21.8 cm 24 cm Vascular Assessment Pulses: Dorsalis Pedis Palpable: [Left:Yes] [Right:Yes] Posterior Tibial Palpable: [Left:Yes] [Right:Yes] Electronic Signature(s) Signed: 02/07/2021 5:44:40 [name] By: Rhae Hammock RN Entered By: Rhae Hammock on 02/04/2021 15:30:56 -------------------------------------------------------------------------------- Multi Wound Chart Details Patient Name: Date of Service: [name] [name] Medical Record Number: [id number] Patient Account Number: [id number] Date of Birth/Sex: Treating RN: May 27, 1956 (65 y.o. [name] [name] Primary Care Noraa Pickeral: [name] [name] Other Clinician: Referring  Levar Fayson: Treating Ellamae Lybeck/Extender: [name] [name]: 3 Vital Signs Height(in): 61 Capillary Blood Glucose(mg/dl): 109 Weight(lbs): 162 Pulse(bpm): 58 Body Mass Index(BMI): 31 Blood Pressure(mmHg): 146/71 Temperature(F): 97.7 Respiratory Rate(breaths/min): 18 Photos: [10:No Photos Right, Lateral Foot] [9:No Photos Left Calcaneus] [N/A:N/A N/A] Wound Location: [10:Gradually Appeared] [9:Gradually Appeared] [N/A:N/A] Wounding Event: [10:Diabetic Wound/Ulcer of the Lower] [9:Diabetic Wound/Ulcer of the Lower] [N/A:N/A] Primary Etiology: [10:Extremity Cataracts, Anemia, Sleep Apnea,] [9:Extremity Cataracts, Anemia, Sleep Apnea,] [N/A:N/A] Comorbid History: [10:Coronary Artery Disease,  Hypertension, Myocardial Infarction, Hypertension, Myocardial Infarction, Type II Diabetes, Osteoarthritis, Osteomyelitis, Neuropathy 09/29/2020] [9:Coronary Artery Disease, Type II Diabetes, Osteoarthritis,  Osteomyelitis, Neuropathy 01/11/2019] [N/A:N/A] Date Acquired: [10:3] [9:3] [N/A:N/A] Weeks of [name]: [10:Open] [9:Open] [N/A:N/A] Wound Status: [10:2.9x3.6x0.3] [9:4.4x4x1] [N/A:N/A] Measurements L x W x D (cm) [10:8.2] [9:13.823] [N/A:N/A] A (cm) : rea [10:2.46] [9:13.823] [N/A:N/A] Volume (cm) : [10:22.70%] [9:-18.90%] [N/A:N/A] % Reduction in A rea: [10:83.40%] [9:45.90%] [N/A:N/A] % Reduction in Volume: [10:Grade 2] [9:Grade 2] [N/A:N/A] Classification: [10:Medium] [9:Medium] [N/A:N/A] Exudate A mount: [10:Serosanguineous] [9:Serosanguineous] [N/A:N/A] Exudate Type: [10:red, brown] [9:red, brown] [N/A:N/A] Exudate Color: [10:Distinct, outline attached] [9:Distinct, outline attached] [N/A:N/A] Wound Margin: [10:Large (67-100%)] [9:Large (67-100%)] [N/A:N/A] Granulation A mount: [10:Red, Pink, Pale] [9:Red, Pink, Pale] [N/A:N/A] Granulation Quality: [10:Small (1-33%)] [9:Small (1-33%)] [N/A:N/A] Necrotic A mount: [10:Fat Layer (Subcutaneous Tissue):  Yes Fat Layer (Subcutaneous Tissue): Yes N/A] Exposed Structures: [10:Fascia: No Tendon: No Muscle: No Joint: No Bone: No Small (1-33%)] [9:Fascia: No Tendon: No Muscle: No Joint: No Bone: No Small (1-33%)] [N/A:N/A] [name] Notes Wound #10 (Foot) Wound Laterality: Right, Lateral Cleanser Soap and Water Discharge Instruction: May shower and wash wound with dial antibacterial soap and water prior to dressing change. Peri-Wound Care Topical Primary Dressing KerraCel Ag Gelling Fiber Dressing, 4x5 in (silver alginate) Discharge Instruction: Apply silver alginate to wound bed as instructed Secondary Dressing Woven Gauze Sponge, Non-Sterile 4x4 in Discharge Instruction: Apply over primary dressing as directed. ABD Pad, 5x9 Discharge Instruction: Apply over primary dressing as directed. Secured With [employer], 4.5x3.1 (in/yd) Discharge Instruction: Secure with Kerlix as directed. Transpore Surgical Tape, 2x10 (in/yd) Discharge Instruction: Secure dressing with tape as directed. Compression Wrap Compression Stockings Add-Ons Wound #9 (Calcaneus) Wound Laterality: Left Cleanser Soap and Water Discharge Instruction: May shower and wash wound with dial antibacterial soap and water prior to dressing change. Peri-Wound Care Topical Primary Dressing KerraCel Ag Gelling Fiber Dressing, 4x5 in (silver alginate) Discharge Instruction: Apply silver alginate to wound bed as instructed Secondary Dressing Woven Gauze Sponge, Non-Sterile 4x4 in Discharge Instruction: Apply over primary dressing as directed. ABD Pad, 5x9 Discharge Instruction: Apply over primary dressing as directed. Secured With [employer], 4.5x3.1 (in/yd) Discharge Instruction: Secure with Kerlix as directed. Transpore Surgical Tape, 2x10 (in/yd) Discharge Instruction: Secure dressing with tape as directed. Compression Wrap Compression Stockings Add-Ons Electronic Signature(s) Signed: 02/04/2021  4:45:31 [name] By: Linton Ham MD Signed: 02/04/2021 5:03:53 [name] By: [name] [name] Entered By: Linton Ham on 02/04/2021 16:34:15 -------------------------------------------------------------------------------- Multi-Disciplinary Care Plan Details Patient Name: Date of Service: [name] [name] Medical Record Number: [id number] Patient Account Number: [id number] Date of Birth/Sex: Treating RN: February 23, 1956 (65 y.o. [name] [name] Primary Care Javonne Dorko: [name] [name] Other Clinician: Referring Sreya Froio: Treating Kerigan Narvaez/Extender: [name] [name]: 3 Active Inactive Pressure Nursing Diagnoses: Knowledge deficit related to management of pressures ulcers Goals: Patient/caregiver will verbalize understanding of pressure ulcer management Date Initiated: 01/10/2021 Target Resolution Date: 02/14/2021 Goal Status: Active Interventions: Assess offloading mechanisms upon admission and as needed Assess potential for pressure ulcer upon admission and as needed Provide education on pressure ulcers Notes: Wound/Skin Impairment Nursing Diagnoses: Impaired tissue integrity Goals: Patient/caregiver will verbalize understanding of skin care regimen Date Initiated: 01/10/2021 Target Resolution Date: 02/14/2021 Goal Status: Active Ulcer/skin breakdown will have a volume reduction of 30% by week 4 Date Initiated: 01/10/2021 Target Resolution Date: 02/14/2021 Goal Status: Active Interventions: Assess patient/caregiver ability to obtain necessary supplies Assess patient/caregiver ability to perform ulcer/skin care regimen upon  admission and as needed Assess ulceration(s) every visit Provide education on ulcer and skin care [name] Activities: Skin care regimen initiated : 01/10/2021 Topical wound management initiated : 01/10/2021 Notes: Electronic Signature(s) Signed: 02/04/2021 3:02:02 [name] By: [name] [name] Entered By:  [name] [name] on 02/04/2021 15:02:01 -------------------------------------------------------------------------------- Pain Assessment Details Patient Name: Date of Service: [name] [name] Medical Record Number: [id number] Patient Account Number: [id number] Date of Birth/Sex: Treating RN: 07/11/1956 (65 y.o. [name] [name] Primary Care Teanna Elem: [name] [name] Other Clinician: Referring Irelynd Zumstein: Treating Cuyler Vandyken/Extender: [name] [name] in [name]: 3 Active Problems Location of Pain Severity and Description of Pain Patient Has Paino Yes Site Locations Rate the pain. Current Pain Level: 8 Pain Management and Medication Current Pain Management: Electronic Signature(s) Signed: 03/21/2021 2:30:24 [name] By: [name] [name] Previous Signature: 02/04/2021 5:03:53 [name] Version By: [name] [name] Previous Signature: 02/07/2021 7:58:59 AM Version By: [name] [name] Entered By: [name] [name] on 03/21/2021 14:30:24 -------------------------------------------------------------------------------- Patient/Caregiver Education Details Patient Name: Date of Service: [name] [name], [name] [name] R. 4/8/2022andnbsp3:00 [name] Medical Record Number: [id number] Patient Account Number: [id number] Date of Birth/Gender: Treating RN: [birth year] (65 y.o. [name] [name] Primary Care Physician: [name] [name] Other Clinician: Referring Physician: Treating Physician/Extender: [name] [name]: 3 Education Assessment Education Provided To: Patient Education Topics Provided Offloading: Methods: Explain/Verbal Responses: State content correctly Wound/Skin Impairment: Methods: Explain/Verbal, Printed Responses: State content correctly Electronic Signature(s) Signed: 02/04/2021 5:03:53 [name] By: [name] [name] Entered By: [name] [name] on 02/04/2021  15:12:23 -------------------------------------------------------------------------------- Wound Assessment Details Patient Name: Date of Service: [name] [name] Medical Record Number: [id number] Patient Account Number: [id number] Date of Birth/Sex: Treating RN: [birth year] (65 y.o. [name] [name], [name] [name] Primary Care Elmor Kost: [name] [name] Other Clinician: Referring Tanishka Drolet: Treating Bertha Earwood/Extender: [name] [name]: 3 Wound Status Wound Number: 10 Primary Diabetic Wound/Ulcer of the Lower Extremity Etiology: Wound Location: Right, Lateral Foot Wound Open Wounding Event: Gradually Appeared Status: Date Acquired: 09/29/2020 Comorbid Cataracts, Anemia, Sleep Apnea, Coronary Artery Disease, Weeks Of [name]: 3 History: Hypertension, Myocardial Infarction, Type II Diabetes, Clustered Wound: No Osteoarthritis, Osteomyelitis, Neuropathy Photos Wound Measurements Length: (cm) 2.9 Width: (cm) 3.6 Depth: (cm) 0.3 Area: (cm) 8.2 Volume: (cm) 2.46 % Reduction in Area: 22.7% % Reduction in Volume: 83.4% Epithelialization: Small (1-33%) Tunneling: No Undermining: No Wound Description Classification: Grade 2 Wound Margin: Distinct, outline attached Exudate Amount: Medium Exudate Type: Serosanguineous Exudate Color: red, brown Foul Odor After Cleansing: No Slough/Fibrino Yes Wound Bed Granulation Amount: Large (67-100%) Exposed Structure Granulation Quality: Red, Pink, Pale Fascia Exposed: No Necrotic Amount: Small (1-33%) Fat Layer (Subcutaneous Tissue) Exposed: Yes Necrotic Quality: Adherent Slough Tendon Exposed: No Muscle Exposed: No Joint Exposed: No Bone Exposed: No Electronic Signature(s) Signed: 02/07/2021 7:58:59 AM By: [name] [name] Signed: 02/07/2021 5:44:40 [name] By: Rhae Hammock RN Entered By: [name] [name] on 02/04/2021  16:38:50 -------------------------------------------------------------------------------- Wound Assessment Details Patient Name: Date of Service: [name] [name] Medical Record Number: [id number] Patient Account Number: [id number] Date of Birth/Sex: Treating RN: June 27, 1956 (65 y.o. [name] [name], [name] [name] Primary Care Sire Poet: [name] [name] Other Clinician: Referring Akilah Cureton: Treating Mohab Ashby/Extender: [name] [name]: 3 Wound Status Wound Number: 9 Primary Diabetic Wound/Ulcer of the Lower Extremity Etiology: Wound Location: Left Calcaneus Wound Open Wounding Event: Gradually Appeared Status: Date Acquired: 01/11/2019 Comorbid Cataracts, Anemia, Sleep Apnea, Coronary Artery Disease, Weeks Of [name]: 3 History: Hypertension, Myocardial  Infarction, Type II Diabetes, Clustered Wound: No Osteoarthritis, Osteomyelitis, Neuropathy Photos Wound Measurements Length: (cm) 4.4 Width: (cm) 4 Depth: (cm) 1 Area: (cm) 13.823 Volume: (cm) 13.823 % Reduction in Area: -18.9% % Reduction in Volume: 45.9% Epithelialization: Small (1-33%) Tunneling: No Undermining: No Wound Description Classification: Grade 2 Wound Margin: Distinct, outline attached Exudate Amount: Medium Exudate Type: Serosanguineous Exudate Color: red, brown Foul Odor After Cleansing: No Slough/Fibrino Yes Wound Bed Granulation Amount: Large (67-100%) Exposed Structure Granulation Quality: Red, Pink, Pale Fascia Exposed: No Necrotic Amount: Small (1-33%) Fat Layer (Subcutaneous Tissue) Exposed: Yes Necrotic Quality: Adherent Slough Tendon Exposed: No Muscle Exposed: No Joint Exposed: No Bone Exposed: No Electronic Signature(s) Signed: 02/07/2021 7:58:59 AM By: [name] [name] Signed: 02/07/2021 5:44:40 [name] By: Rhae Hammock RN Entered By: [name] [name] on 02/04/2021  16:40:02 -------------------------------------------------------------------------------- Vitals Details Patient Name: Date of Service: [name] [name] Medical Record Number: [id number] Patient Account Number: [id number] Date of Birth/Sex: Treating RN: [birth year] (65 y.o. [name] [name] Primary Care Nivin Braniff: [name] [name] Other Clinician: Referring Scotti Kosta: Treating Angline Schweigert/Extender: [name] [name] in [name]: 3 Vital Signs Time Taken: 15:21 Temperature (F): 97.7 Height (in): 61 Pulse (bpm): 79 Weight (lbs): 162 Respiratory Rate (breaths/min): 18 Body Mass Index (BMI): 30.6 Blood Pressure (mmHg): 146/71 Capillary Blood Glucose (mg/dl): 109 Reference Range: 80 - 120 mg / dl Electronic Signature(s) Signed: 03/21/2021 2:30:13 [name] By: [name] [name] Previous Signature: 02/07/2021 7:58:59 AM Version By: [name] [name] Entered By: [name] [name] on 03/21/2021 14:30:13

## 2021-02-05 ENCOUNTER — Ambulatory Visit (HOSPITAL_COMMUNITY): Admission: RE | Admit: 2021-02-05 | Payer: PRIVATE HEALTH INSURANCE | Source: Ambulatory Visit

## 2021-02-08 ENCOUNTER — Telehealth: Payer: Self-pay | Admitting: Family Medicine

## 2021-02-08 NOTE — Telephone Encounter (Signed)
Medication: HYDROcodone-acetaminophen (NORCO/VICODIN) 5-325 MG tablet [[id number]]      Has the patient contacted their pharmacy? no (If no, request that the patient contact the pharmacy for the refill.) (If yes, when and what did the pharmacy advise?)    Preferred Pharmacy (with phone number or street name): [id number] - [place], [place] - [address] AT [address] Phone:  [phone]  Fax:  [phone]         Agent: Please be advised that RX refills may take up to 3 business days. We ask that you follow-up with your pharmacy.

## 2021-02-09 ENCOUNTER — Encounter (HOSPITAL_COMMUNITY): Payer: Self-pay

## 2021-02-10 ENCOUNTER — Other Ambulatory Visit: Payer: Self-pay

## 2021-02-10 ENCOUNTER — Encounter (HOSPITAL_BASED_OUTPATIENT_CLINIC_OR_DEPARTMENT_OTHER): Payer: PRIVATE HEALTH INSURANCE | Admitting: Internal Medicine

## 2021-02-10 ENCOUNTER — Other Ambulatory Visit: Payer: Self-pay | Admitting: Family Medicine

## 2021-02-10 DIAGNOSIS — G894 Chronic pain syndrome: Secondary | ICD-10-CM

## 2021-02-10 DIAGNOSIS — Z981 Arthrodesis status: Secondary | ICD-10-CM

## 2021-02-10 DIAGNOSIS — M8610 Other acute osteomyelitis, unspecified site: Secondary | ICD-10-CM

## 2021-02-10 DIAGNOSIS — E11621 Type 2 diabetes mellitus with foot ulcer: Secondary | ICD-10-CM | POA: Diagnosis not present

## 2021-02-10 MED ORDER — HYDROCODONE-ACETAMINOPHEN 5-325 MG PO TABS: 1.0000 | ORAL_TABLET | Freq: Four times a day (QID) | ORAL | 0 refills | Status: DC | PRN

## 2021-02-10 NOTE — Telephone Encounter (Signed)
Sent in

## 2021-02-10 NOTE — Telephone Encounter (Signed)
Requesting: Hydrocodone/ acetaminophen Contract: 06/24/20 UDS: 06/24/20 Last Visit: 02/01/21 Next Visit: none Last Refill: 01/14/20  Please Advise

## 2021-02-10 NOTE — Progress Notes (Signed)
[name], [name] ([id number]) Visit Report for 02/10/2021 HPI Details Patient Name: Date of Service: [name] [name] RE, [name] 02/10/2021 4:00 PM Medical Record Number: [id number] Patient Account Number: [id number] Date of Birth/Sex: Treating RN: [birth year] (65 y.o. [name] [name], [name] [name] Primary Care Provider: Roma [name] Other Clinician: Referring Provider: Treating Provider/Extender: [name] [name] in Treatment: 4 History of Present Illness HPI Description: ADMISSION 11/12/2018 This is a 65 year old woman with type 2 diabetes and diabetic neuropathy. She has been dealing with a left heel plantar wound for roughly 2 years. She states this started when she pulled some skin off the area and it progressed into a wound. She also has an area on the tip of her left great toe for 1 year. She has been largely followed by Dr. Sharol [name] and she had an excision of bone in the left heel in 2017. I do not see microbiology from this excision or pathology. Apparently this wound never really closed. She most recently has been using Silvadene cream. Offloading this with a scooter. The last MRI I see was in June 2018 which did not show osteomyelitis at that time. Apparently this wound is never really progressed towards healing. During her last review by Dr. Sharol [name] in October it was recommended that she undergo a left BKA and she refused. She went to see a second orthopedic consult at [facility] who am recommended conservative wound care to see if this will close or progress towards closure but also warned that possible surgery may be necessary. An x-ray that was done at [facility] showed an irregular calcaneal body and calcaneal tuberosity. This is probably postprocedural. Previous x-rays at [facility] had suggested heterotrophic calcifications but I do not see this. Apparently a second ointment was added to the Silvadene which the patient thinks is because some improvement. She has not been  systemically unwell. No fever or chills. She thinks the second ointment that was [name] to her at [facility] has helped somewhat. 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 ABIs done in June 2018 showed a ABI of 1.12 on the right and 1.1 on the left she was biphasic bilaterally. ABIs in our clinic were noncompressible today. 11/20/18 on evaluation today patient is seen for her second visit here in the office although this is actually the first visit with me concerning an issue that she's having with her great toe and heel of the left foot. Fortunately she does not appear to be have any discomfort at this time and she does have a scooter in order to offload her foot she also has a boot for offloading. With that being said this is something that appears to have been going on for some time she was seeing Dr. due to his recommendation was that she was going to require a below knee amputation. With that being said she wanted to come to the wound center but according to the patient Dr. Sharol [name] told her that "we could not help her". Nonetheless she saw another provider who suggested that it may be worth a shot for [name] to try and help her out if it all possible. Nonetheless she decided that it would be worth a trial since otherwise any  the way she's gonna end up with an amputation. Obviously if we can heal the wound then that will not be the case. Again I explained to her that obviously there are no guarantees but we will give this a good try and attempts to get the wound to heal. 1/30; the patient continues to have areas on the left plantar heel and the left plantar great toe. The more worrisome area is the heel. She  went for MRI on Saturday but this could not be done because she had silver alginate in the wound bed. I would like to get this rebooked. If she does not have osteomyelitis she will need a total contact cast. We have been using silver alginate in the wounds 2/7; patient has wounds on her left plantar heel and left plantar great toe. The area on the heel has some depth although it looks about the same today. Her MRI will finally be done tomorrow. If she has osteomyelitis in the heel and then we will need to consider her for IV antibiotics and hyperbaric oxygen. If the MRI is negative she will need a total contact cast although she is wearing a cam boot and using a scooter at present. Will be using silver alginate. She will take it off tomorrow in preparation for the MRI 2/14; the patient's MRI showed extensive surgical changes with a large portion of her calcaneus removed on the left secondary to her previous surgery by Dr. Sharol [name] however there is no evidence of osteomyelitis. She would therefore be a candidate for a total contact cast and we applied this for the first time today 2/21; silver collagen total contact cast. The area on the plantar left great toe is "healed" still a lot of callus on this area. Dimensions on the plantar heel not too much different some epithelialization is present however. This is an improvement 12/25/18 on evaluation today patient actually is seen for follow-up concerning issues that she has been having with her cast she states she feels like it's wet and squishy in the bottom of her cast. The reason she does come in to have this evaluated and see what is going on. Fortunately there's no signs of infection at this time was the cast was removed. 3/6; the patient's area on the tip of her right great toe is callused but there is no open area here. She still has the fairly extensive area in the left heel. We have been using silver collagen in the wound. 01/08/19 on evaluation  today patient actually appears to be doing very well in regard to her heel ulcer in my pinion to see if you shown signs of improvement which is excellent news. There's no evidence of infection. She continues to have quite a bit of drainage but fortunately again this doesn't seem to be hindering her healing. 3/20 -Patient's foot ulcer on the left appears to be doing well, the dimensions appear encouraging, we have been using total contact cast with Prisma and will continue doing that 3/27; patient continues to make nice progress on the left plantar heel. She is using silver collagen under a total contact cast. It is been a while since I have seen this wound and it really looks a lot better. Smaller with healthy granulation 4/3; she continues to make nice progress on the left plantar heel. Using silver collagen under a total contact cast 4/10; left plantar heel. Again skin over the surface of the wound with not much in the way of adherence. This  results in undermining. Using silver collagen changed to silver alginate 4/17 left plantar heel. Again not much improvement. There is no undermining today no debridement was required we used silver alginate last time because of excessive moisture 4/24 left plantar heel. Again not as much improvement as I would have liked. About 3 mm of depth. Thick callused skin around the circumference. Using silver alginate 5/1; left plantar heel this is improved this week. Less depth epithelialization is present. Using silver alginate alginate under a total contact cast 5/8; left plantar heel dimensions are about the same. The depth appears to be improved. I use silver collagen starting today under the cast 5/15 left plantar heel. Arrives today with a 2-day history of feeling like something had "slipped" while walking her dog in the cast. Unfortunately extensive area relatively to the small wound of undermining superiorly denuded epithelium. 5/22-Patient returns at 1 week  after being taken off the TCC on account of some fluid collection that was debrided and cultured at last visit from the plantar ulcer on the left heel. The culture results are polymicrobial with Klebsiella, enterococcus, Proteus growth these organisms are sensitive to Cipro except with enterococcus which is sensitive to ampicillin but patient is highly allergic to penicillin according to her. 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 5/29; the patient arrived last week with a new wound on her left plantar great toe. With regards to the culture that I did 2 weeks ago of her deteriorating heel wound this grew Klebsiella and enterococcus. She was [name] Cipro however the enterococcus would not be covered well by a quinolone. She is allergic to penicillin. I will give her linezolid 600 twice daily x5 days 6/12; the patient continues to have a wound on her left and after she returns to the beach there is undermining laterally. She has the new wound from this 2 weeks or so ago on the plantar tip of her left great toe. She had a fall today scraping the dorsal surface of the left fifth 7/14; READMISSION since the patient was last here she was hospitalized from 04/15/2019 through 04/22/2019. She had presented to an outside ER after falling and hitting her elbow. She had had previous surgery on the elbow and fractures several years ago. An x-ray was negative and she was sent home. She is readmitted with sepsis and acute renal failure secondary to septic arthritis of the elbow. Cultures apparently showed pansensitive staph aureus however she has a severe beta-lactam allergy. She was treated with vancomycin. Apparently the vancomycin is completed and her PICC line is removed although she is seeing Dr. Megan Salon tomorrow due to continued pain and swelling. In the hospital she had an IandD by Dr. [name] of orthopedics. The original  surgery was on 04/17/2019 and it was felt that she had septic arthritis. She is still having a lot of pain and swelling in the right elbow and apparently is due to see Dr. Megan Salon tomorrow and what she thinks is that she will be restarted on antibiotics. With regards to her heel wounds/left foot wounds. Her arterial studies were checked and her ABIs were within normal limits. X-ray showed plantar foot ulcer negative for osteomyelitis postop resection of the posterior calcaneus. She has been using silver alginate to the heel 8/4-Patient returns after being seen on 7/14, we are using silver alginate to the calcaneal wound, she is continuing to receive care for her  right elbow septic arthritis 8/14- Patient returns after 1 week, she is being seen by the surgical group for her right elbow, she is here for the left calcaneal wound for which we are using silver alginate this is about the same 8/21; this is a patient I readmitted to the clinic 5 weeks ago. She is continuing to have difficulties with a septic arthritis of the right elbow she had after a fall and apparently has had surgical IandD's since the last time we saw her. She is also following with Dr. Megan Salon of infectious disease and is apparently on 3 oral antibiotics although at the time of this dictation I am not sure what they are. She comes in today with a necrotic surface on the left great toe with a blister laterally. This was still clearly an open wound. She had purulent drainage coming out of this. The original wound on the plantar aspect of her right heel in the setting of a Charcot foot is deep not open to bone but certainly not any better at all. We have been using silver alginate 8/28; dealing with septic arthritis of the right elbow. May need to go for further surgery here in the second week of September. She had a blister on the left great toe that was purulent [place] draining last week although a culture did not grow anything [already  on antibiotics through infectious disease for her elbow]. The punched-out area on her heel is just like it was when she first came in. This almost closed with a total contact cast there are no options for that now. The patient states she cannot stay off her foot having to do housework X-ray of the foot showed a moderate bunion and severe degenerative changes at the first metatarsal phalangeal joint there was a moderate plantar calcaneal spur. We will need to check the location of this. 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 It is oral. We are using silver collagen to the punched-out area on her left heel and to the summer superficial area of the left great toe. She is offloading this is best she can although judging by the amount of callus it is not enough. She thinks she has enough strength in her right elbow now to use her scooter. 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 Hale Bogus notes and infectious disease. We are using silver collagen to the punched-out areas on her left heel and to the plantar aspect of her left great toe she is now using her scooter to get around and to help offload these areas 10/2; 2-week follow-up. Still on clindamycin I believe for the left elbow she is going for an MRI of the elbow over the weekend. She is then going to see orthopedics and infectious disease. The area on the left heel is certainly no better. This does not probe to bone however there is green drainage. She has an area on the tip of her toe. The area on the heel is in a wound we almost closed at one point with total contact casting. 10/9; one-week follow-up. Culture I did of the heel  last week which was  a swab culture admittedly showed moderate Enterococcus faecalis moderate Pseudomonas. The wound itself looks about the same. There is no palpable bone although the depth of it closely approximates bone. The heel is swollen but not erythematous. Her MRI is booked for next week. She also has an MRI of the right elbow. I've also lifted the last consult from Dr. Megan Salon who is following her for septic arthritis of the right elbow. I did not see him specifically comment on her left heel however the patient states that he is aware of this. I did not specifically address the organisms I cultured last week because of the possible effect of any additional cultures that will be done on the elbow. Additionally these were superficial not bone cultures 10/16; her MRI of the heel was put back to next week. She did have her elbow done. I have been in contact with Dr. Megan Salon about my concerns about osteomyelitis of the left heel. We have been using collagen. She also has a wound on the plantar tip of her first toe 10/23; her MRI of the heel was negative for osteomyelitis but suggested a small abscess. She also had an MRI of her elbow that showed findings compatible with a septic joint. She went to see Dr. Megan Salon and she is going to have both oral and IV antibiotics which I am sure will cover any infection in the heel. I did not actually see his note today. We are using silver collagen offloading the heel with a heel offloading boot 11/6; patient continues with silver collagen to both wound areas on her plantar left heel and plantar left great toe. She remains on daptomycin by Dr. Megan Salon of infectious disease. She complains of diarrhea. Dr. Megan Salon is aware of this. 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 she has been put on oral vancomycin I think a  lot of high clinical suspicion for pseudomembranous colitis Because of the right elbow there are no options to completely off her left foot beyond the heel offloading boot we have now. Fortunately the left heel ulcer itself has remained static some improvement in the plantar left great toe 11/20; patient arrives for review of her left heel ulcer. I also note she has a difficult time with septic arthritis of the right elbow. She currently is on IV daptomycin which seems to have helped the drainage in her elbow [MSSA]. Because of high concern for pseudomembranous colitis she was also put on vancomycin orally and Flagyl orally. She was on Levaquin but that was discontinued because of the concern about C. difficile. She states her diarrhea is better. She arrives in clinic today with a large area of denuded skin on the lateral part of the heel. This had totally separated. We have been using silver alginate to the wound areas. She arrived in a scooter telling me she is offloading the foot is much as possible. 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 Dr. Megan Salon stopped the daptomycin and now has her on vancomycin 1000 mg IV daily. This is supposed to go onto 1/18. She is being referred to [place] for what sounds like a elbow replacement type operation for her MSSA septic arthritis. She  arrives in clinic today with a blister on the medial part of the wound on her heel. She still has a lot of callus around the heel although the surface of the wound on the left heel looks somewhat better. READMISSION 03/19/2020 [name] [name] is a 65 year old woman we have followed for almost all of 2020 with a neuropathic wound on her left heel and the tip of her left great toe.. We she had previously had a partial calcanectomy  by Dr. Sharol [name] because of underlying osteomyelitis. She also had a history of MRSA even when we admitted her to the clinic. An MRI when she first came into our clinic did not show osteomyelitis. We put her in a total contact cast and gradually this contracted to the point it was almost closed however in that same timeframe she had a fall. 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 We had to stop putting her in a cast partially because of infection and partially because she could not support herself with the elbow. Things went progressively downhill. When we last saw her at the end of December she had a large open wound on the left heel. She had had recurrent infections in the right elbow. It was felt that either the heel lift: Ice her elbow or vice versa. We never had MRSA I do not believe cultured in the left heel however. 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 She underwent an operative debridement and a T11-L3 fusion. An operative culture grew Pseudomonas. She was treated with cefepime and required readmission to the hospital with acute kidney failure requiring temporary dialysis. She was discharged to rehab I believe she signed herself out [place]. He has been following with her primary doctor and she comes in the clinic today with miraculously the left heel totally closed. She is followed up with Dr. Megan Salon of infectious disease he does not feel she has any evidence of infection in her elbow or her heel for that matter and the heel is actually fully epithelialized. She is on ciprofloxacin 500 twice daily I think mostly directed at Pseudomonas. The patient is convinced that it was the treatment of the Pseudomonas in her back that ultimately led to closure of her heel and that certainly possible or could have been because she was nonambulatory in the hospital for 3 months according to her. She is still  using her heel offloading boot and she says she is "learning to walk again" Readmission: 06/09/2020 patient presents today for reevaluation here in the clinic following a reopening of her heel ulcer. She tells [name] that this actually occurred about 2 weeks after she was last seen in May of this year though it is now the beginning of August and she has not come back until now to see [name]. Nonetheless she does have an open wound there is been no x-rays at this time infectious disease has been monitoring him following this up until this point. They made the recommendation that she come to see [name]. Fortunately there is no signs of active infection systemically though she does tell me that she had Pseudomonas in the spine when she had her back surgery she is currently on antibiotics for that. 8/20; this is a patient who I saw 1 time in late May. Apparently shortly after she was here the area on her plantar left heel started to drain again and she had an open  wound. She has been going to her primary physician and has been following with Dr. Megan Salon. 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 She is currently on 3 times a day prophylactic ciprofloxacin. She is admitted to the clinic last week for review of the wound on the left heel. Notable for the fact that she had a partial calcanectomy by Dr. Sharol [name] 3 or 4 years ago. She says she is using her motorized wheelchair again as an off loader but she came in in regular shoes. 9/2; 2-week follow-up. Plantar left heel. In spite of debridement last week she had thick very adherent callus around the circumference of the wound and a nonviable gray surface. We have been using Hydrofera Blue. We gave her a heel offloading boot last week. She says she is off her foot except for she needs to walk her dog. 9/17; 2 week follow-up. Lives with a heel ulcer in roughly the same condition  as a month ago. Though requiring debridement and revision using Hydrofera Blue. She says she is often except for times that she needs to walk her dog and she claims to be using the heel offloading shoe. She has a new injury which she says was a burn on the medial part of her fifth metacarpophalangeal on the right. 10/15; almost 1 month follow-up. She arrives with the plantar left heel ulcer and not a very good condition. She has thick callus surrounding the wound and a necrotic base over the top of this. I am not sure anybody is paying close attention to this. She says she walks minimally but I highly doubt this. She is supposed to be using Hydrofera Blue. She says she has plenty of supplies. She tells me that she is traveling to [place] next week for a 10-day trip. We will see her back after that She has both been approved for Dermagraft however I am only willing to attempt this if she is going to agree to a total contact cast. I do not think there is any point in applying this advance dressing when she is walking on this excessively READMISSION 01/10/2021 The patient was last here in October at which time we were dealing with a very difficult wound on the left plantar heel. It was not in very good condition at the time. She tells me she went to [place] on vacation. Shortly after she arrived back her husband was in an accident and then after that she had congestive heart failure secondary to her advanced chronic renal failure. I thought she told me she was admitted to hospital but I certainly do not see this at [facility] health. In any case while she was markedly fluid overloaded she developed an open area on the right lateral heel to go along with the original wound on the left. Both of these completely covered with necrotic surface. I do not know that she is been dressing these at all. She has a heel offloading boot on the left and an ordinary shoe on the right. Before she left last time I  did an MRI of the left heel that did not show distinctly osteomyelitis. She was approved for Dermagraft but the wounds are certainly not in a state for application of an advanced treatment product at this point Besides this her past medical history is largely unchanged. She has type 2 diabetes with peripheral neuropathy with stage IV  chronic renal failure last estimated GFR at 20. 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 Culture at that point showed Pseudomonas. She is not currently on antibiotics. She was on suppressive ciprofloxacin for the spine osteomyelitis but she says this was stopped by Dr. Megan Salon. Her primary doctor recently gave her a course of doxycycline but she could not tolerate it because of nausea and a rash ABIs in our clinic were 1.13 on the right and 1.15 on the left 3/25; patient comes back to see [name] after re-presenting last week with large ulcers on her bilateral heels. 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 She also has chronic renal failure. 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 She presents spent a prolonged period of time on antibiotics. Culture of the lumbar area I believes had Pseudomonas whereas peripheral cultures of the elbow showed MRSA. She presented last week with deep ulcers on her bilateral plantar heel and the lateral part of her right heel. She has never offloaded this properly. PLAIN X-rays of both heels did not show osteomyelitisin either heel.we've been using silver alginate while we work through the possibilities of coexistent infection/osteomyelitis. She is wearing bilateral heel offloading  boots although I've never been certain about the adequacy of her offloading. She also has a scooter 4/1; bilateral heel ulcers the area on the left very deep but does not go to bone. Nevertheless the wound itself is somewhat senescent and lifeless looking. She has her MRI booked for next week we should be able to go over this in a week's time. The area on the right has thick skin and callus and fibrinous debris on the surface. She is offloading with a scooter claims that she is being a lot more careful about offloading than she was in the past. She does not have an arterial issue by clinical exam or ABIs 4/8; bilateral heel ulcers. Both of them look about the same although less callus around the edges. I am still going to continue the silver alginate as there is very little dressing alternative that would cover the area here perhaps Hydrofera Blue or Sorbact. Her MRI on the left is not till tomorrow. If the MRI was -1 of these areas is going in a total contact cast probably on the right for now 4/14; she did not get her MRI of the left heel because she had silver alginate in it rescheduled for this coming Saturday in 2 days. We will use Hydrofera Blue in both of these wounds the area on the right heel is smaller. If we get the MRI and there is no osteomyelitis the left heel is going to require debridement of a nonviable surface. Probably Iodoflex or Sorbact. When the surface becomes viable then I would consider a total contact cast plus or minus a skin substitute question Apligraf Electronic Signature(s) Signed: 02/10/2021 5:23:41 PM By: Linton Ham MD Entered By: Linton Ham on 02/10/2021 17:18:40 -------------------------------------------------------------------------------- Physical Exam Details Patient Name: Date of Service: [name], [name] 02/10/2021 4:00 PM Medical Record Number: [id number] Patient Account Number: [id number] Date of Birth/Sex: Treating RN: 12/07/1955 (65 y.o.  Benjaman Lobe Primary Care  Provider: Roma [name] Other Clinician: Referring Provider: Treating Provider/Extender: [name] [name] in Treatment: 4 Cardiovascular Pedal pulses palpable and strong bilaterally.. Notes Wound exam The original wound on the left plantar heel looks the same. It is dry the surface looks somewhat lifeless. However there is no exposed bone. I been waiting for the MRI before proceeding with an aggressive debridement of the surface of this to see if we can get more viable tissue. If the MRI is negative then I think she will require perhaps Sorbact or Iodoflex to get to a more viable surface. Then a total contact cast plus or minus a skin substitute. The wound on the right heel actually is contracting she has a lot of callus in this area. Surface of the wound looks quite viable Electronic Signature(s) Signed: 02/10/2021 5:23:41 PM By: Linton Ham MD Entered By: Linton Ham on 02/10/2021 17:21:45 -------------------------------------------------------------------------------- Physician Orders Details Patient Name: Date of Service: [name], [name] 02/10/2021 4:00 PM Medical Record Number: [id number] Patient Account Number: [id number] Date of Birth/Sex: Treating RN: 26-Sep-1956 (65 y.o. [name] [name], [name] [name] Primary Care Provider: Roma [name] Other Clinician: Referring Provider: Treating Provider/Extender: [name] [name] in Treatment: 4 Verbal / Phone Orders: No Diagnosis Coding Follow-up Appointments Return Appointment in 1 week. Bathing/ Shower/ Hygiene May shower and wash wound with soap and water. Off-Loading Open toe surgical shoe to: - Right Foot Heel suspension boot to: - Gloped Heel Offloader to left foot Additional Orders / Instructions Follow Nutritious Diet Wound Treatment Wound #10 - Foot Wound Laterality: Right, Lateral Cleanser: Soap and Water 1 x Per Day/15  Days Discharge Instructions: May shower and wash wound with dial antibacterial soap and water prior to dressing change. Prim Dressing: Hydrofera Blue Classic Foam, 4x4 in (Generic) 1 x Per Day/15 Days ary Discharge Instructions: Moisten with saline prior to applying to wound bed Secondary Dressing: Woven Gauze Sponge, Non-Sterile 4x4 in 1 x Per Day/15 Days Discharge Instructions: Apply over primary dressing as directed. Secondary Dressing: ABD Pad, 5x9 1 x Per Day/15 Days Discharge Instructions: Apply over primary dressing as directed. Secured With: [employer], 4.5x3.1 (in/yd) 1 x Per Day/15 Days Discharge Instructions: Secure with Kerlix as directed. Secured With: Transpore Surgical Tape, 2x10 (in/yd) 1 x Per Day/15 Days Discharge Instructions: Secure dressing with tape as directed. Wound #9 - Calcaneus Wound Laterality: Left Cleanser: Soap and Water 1 x Per DHR/41 Days Discharge Instructions: May shower and wash wound with dial antibacterial soap and water prior to dressing change. Prim Dressing: Hydrofera Blue Classic Foam, 4x4 in 1 x Per Day/15 Days ary Discharge Instructions: Moisten with saline prior to applying to wound bed Secondary Dressing: Woven Gauze Sponge, Non-Sterile 4x4 in 1 x Per Day/15 Days Discharge Instructions: Apply over primary dressing as directed. Secondary Dressing: ABD Pad, 5x9 1 x Per Day/15 Days Discharge Instructions: Apply over primary dressing as directed. Secured With: [employer], 4.5x3.1 (in/yd) 1 x Per Day/15 Days Discharge Instructions: Secure with Kerlix as directed. Secured With: Transpore Surgical Tape, 2x10 (in/yd) 1 x Per Day/15 Days Discharge Instructions: Secure dressing with tape as directed. Electronic Signature(s) Signed: 02/10/2021 5:23:41 PM By: Linton Ham MD Signed: 02/10/2021 5:45:48 PM By: Rhae Hammock RN Entered By: Rhae Hammock on 02/10/2021  17:00:37 -------------------------------------------------------------------------------- Problem List Details Patient Name: Date of Service: [name], [name] 02/10/2021 4:00 PM Medical Record Number: [id number] Patient Account Number: [id number] Date of Birth/Sex: Treating RN: 12-09-1955 (65 y.o. 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 02/10/2021 4:00 PM Medical Record Number: [id number] Patient Account Number: [id number] Date of Birth/Sex: Treating RN: 1956-10-29 (65 y.o. [name] [name], [name] [name] Primary Care Provider: Roma [name] Other Clinician: Referring Provider: Treating Provider/Extender: [name] [name] in Treatment: 4 Subjective History of Present Illness (HPI) ADMISSION 11/12/2018 This is a 65 year old woman with type 2 diabetes and diabetic neuropathy. She has been dealing with a left heel plantar wound for roughly 2 years. She states this started when she pulled some skin off the area and it progressed into a wound. She also has an area on the tip of her left  great toe for 1 year. She has been largely followed by Dr. Sharol [name] and she had an excision of bone in the left heel in 2017. I do not see microbiology from this excision or pathology. Apparently this wound never really closed. She most recently has been using Silvadene cream. Offloading this with a scooter. The last MRI I see was in June 2018 which did not show osteomyelitis at that time. Apparently this wound is never really progressed towards healing. During her last review by Dr. Sharol [name] in October it was recommended that she undergo a left BKA and she refused. She went to see a second orthopedic consult at [facility] who am recommended conservative wound care to see if this will close or progress towards closure but also warned that possible surgery may be necessary. An x-ray that was done at [facility] showed an irregular calcaneal body and calcaneal tuberosity. This is probably postprocedural. Previous x-rays at [facility] had suggested heterotrophic calcifications but I do not see this. Apparently a second ointment was added to the Silvadene which the patient thinks is because some improvement. She has not been systemically unwell. No fever or chills. She thinks the second ointment that was [name] to her at [facility] has helped somewhat. 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 ABIs done in June 2018 showed a ABI of 1.12 on the right and 1.1 on the left she was biphasic bilaterally. ABIs in our clinic were noncompressible today. 11/20/18 on evaluation today patient is seen for her second visit here in the office although this is actually the first visit with me concerning an issue that she's having with her  great toe and heel of the left foot. Fortunately she does not appear to be have any discomfort at this time and she does have a scooter in order to offload her foot she also has a boot for offloading.  With that being said this is something that appears to have been going on for some time she was seeing Dr. due to his recommendation was that she was going to require a below knee amputation. With that being said she wanted to come to the wound center but according to the patient Dr. Sharol [name] told her that "we could not help her". Nonetheless she saw another provider who suggested that it may be worth a shot for [name] to try and help her out if it all possible. Nonetheless she decided that it would be worth a trial since otherwise any the way she's gonna end up with an amputation. Obviously if we can heal the wound then that will not be the case. Again I explained to her that obviously there are no guarantees but we will give this a good try and attempts to get the wound to heal. 1/30; the patient continues to have areas on the left plantar heel and the left plantar great toe. The more worrisome area is the heel. She went for MRI on Saturday but this could not be done because she had silver alginate in the wound bed. I would like to get this rebooked. If she does not have osteomyelitis she will need a total contact cast. We have been using silver alginate in the wounds 2/7; patient has wounds on her left plantar heel and left plantar great toe. The area on the heel has some depth although it looks about the same today. Her MRI will finally be done tomorrow. If she has osteomyelitis in the heel and then we will need to consider her for IV antibiotics and hyperbaric oxygen. If the MRI is negative she will need a total contact cast although she is wearing a cam boot and using a scooter at present. Will be using silver alginate. She will take it off tomorrow in preparation for the MRI 2/14; the patient's MRI showed  extensive surgical changes with a large portion of her calcaneus removed on the left secondary to her previous surgery by Dr. Sharol [name] however there is no evidence of osteomyelitis. She would therefore be a candidate for a total contact cast and we applied this for the first time today 2/21; silver collagen total contact cast. The area on the plantar left great toe is "healed" still a lot of callus on this area. Dimensions on the plantar heel not too much different some epithelialization is present however. This is an improvement 12/25/18 on evaluation today patient actually is seen for follow-up concerning issues that she has been having with her cast she states she feels like it's wet and squishy in the bottom of her cast. The reason she does come in to have this evaluated and see what is going on. Fortunately there's no signs of infection at this time was the cast was removed. 3/6; the patient's area on the tip of her right great toe is callused but there is no open area here. She still has the fairly extensive area in the left heel. We have been using silver collagen in the wound. 01/08/19 on evaluation today patient actually appears to be doing very well in regard to her heel ulcer in my pinion to see if you shown signs of improvement which is excellent news. There's no evidence of infection. She continues to have quite a bit of drainage but fortunately again this doesn't seem to be hindering her healing. 3/20 -Patient's foot ulcer on the left appears to be doing well, the dimensions  appear encouraging, we have been using total contact cast with Prisma and will continue doing that 3/27; patient continues to make nice progress on the left plantar heel. She is using silver collagen under a total contact cast. It is been a while since I have seen this wound and it really looks a lot better. Smaller with healthy granulation 4/3; she continues to make nice progress on the left plantar heel. Using silver  collagen under a total contact cast 4/10; left plantar heel. Again skin over the surface of the wound with not much in the way of adherence. This results in undermining. Using silver collagen changed to silver alginate 4/17 left plantar heel. Again not much improvement. There is no undermining today no debridement was required we used silver alginate last time because of excessive moisture 4/24 left plantar heel. Again not as much improvement as I would have liked. About 3 mm of depth. Thick callused skin around the circumference. Using silver alginate 5/1; left plantar heel this is improved this week. Less depth epithelialization is present. Using silver alginate alginate under a total contact cast 5/8; left plantar heel dimensions are about the same. The depth appears to be improved. I use silver collagen starting today under the cast 5/15 left plantar heel. Arrives today with a 2-day history of feeling like something had "slipped" while walking her dog in the cast. Unfortunately extensive area relatively to the small wound of undermining superiorly denuded epithelium. 5/22-Patient returns at 1 week after being taken off the TCC on account of some fluid collection that was debrided and cultured at last visit from the plantar ulcer on the left heel. The culture results are polymicrobial with Klebsiella, enterococcus, Proteus growth these organisms are sensitive to Cipro except with enterococcus which is sensitive to ampicillin but patient is highly allergic to penicillin according to her. 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 5/29; the patient arrived last week with a new wound on her left plantar great toe. With regards to the culture that I did 2 weeks ago of her deteriorating heel wound this grew Klebsiella and enterococcus. She was [name] Cipro however the enterococcus would not be covered well by a  quinolone. She is allergic to penicillin. I will give her linezolid 600 twice daily x5 days 6/12; the patient continues to have a wound on her left and after she returns to the beach there is undermining laterally. She has the new wound from this 2 weeks or so ago on the plantar tip of her left great toe. She had a fall today scraping the dorsal surface of the left fifth 7/14; READMISSION since the patient was last here she was hospitalized from 04/15/2019 through 04/22/2019. She had presented to an outside ER after falling and hitting her elbow. She had had previous surgery on the elbow and fractures several years ago. An x-ray was negative and she was sent home. She is readmitted with sepsis and acute renal failure secondary to septic arthritis of the elbow. Cultures apparently showed pansensitive staph aureus however she has a severe beta-lactam allergy. She was treated with vancomycin. Apparently the vancomycin is completed and her PICC line is removed although she is seeing Dr. Megan Salon tomorrow due to continued pain and swelling. In the hospital she had an IandD by Dr. [name] of orthopedics. The original surgery was on 04/17/2019 and it was felt that she had septic arthritis. She  is still having a lot of pain and swelling in the right elbow and apparently is due to see Dr. Megan Salon tomorrow and what she thinks is that she will be restarted on antibiotics. With regards to her heel wounds/left foot wounds. Her arterial studies were checked and her ABIs were within normal limits. X-ray showed plantar foot ulcer negative for osteomyelitis postop resection of the posterior calcaneus. She has been using silver alginate to the heel 8/4-Patient returns after being seen on 7/14, we are using silver alginate to the calcaneal wound, she is continuing to receive care for her right elbow septic arthritis 8/14- Patient returns after 1 week, she is being seen by the surgical group for her right elbow, she is  here for the left calcaneal wound for which we are using silver alginate this is about the same 8/21; this is a patient I readmitted to the clinic 5 weeks ago. She is continuing to have difficulties with a septic arthritis of the right elbow she had after a fall and apparently has had surgical IandD's since the last time we saw her. She is also following with Dr. Megan Salon of infectious disease and is apparently on 3 oral antibiotics although at the time of this dictation I am not sure what they are. She comes in today with a necrotic surface on the left great toe with a blister laterally. This was still clearly an open wound. She had purulent drainage coming out of this. The original wound on the plantar aspect of her right heel in the setting of a Charcot foot is deep not open to bone but certainly not any better at all. We have been using silver alginate 8/28; dealing with septic arthritis of the right elbow. May need to go for further surgery here in the second week of September. She had a blister on the left great toe that was purulent [place] draining last week although a culture did not grow anything [already on antibiotics through infectious disease for her elbow]. The punched-out area on her heel is just like it was when she first came in. This almost closed with a total contact cast there are no options for that now. The patient states she cannot stay off her foot having to do housework X-ray of the foot showed a moderate bunion and severe degenerative changes at the first metatarsal phalangeal joint there was a moderate plantar calcaneal spur. We will need to check the location of this. 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 It is oral. We are using silver collagen to the punched-out area on her left heel and to the summer superficial area of  the left great toe. She is offloading this is best she can although judging by the amount of callus it is not enough. She thinks she has enough strength in her right elbow now to use her scooter. 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 Hale Bogus notes and infectious disease. We are using silver collagen to the punched-out areas on her left heel and to the plantar aspect of her left great toe she is now using her scooter to get around and to  help offload these areas 10/2; 2-week follow-up. Still on clindamycin I believe for the left elbow she is going for an MRI of the elbow over the weekend. She is then going to see orthopedics and infectious disease. The area on the left heel is certainly no better. This does not probe to bone however there is green drainage. She has an area on the tip of her toe. The area on the heel is in a wound we almost closed at one point with total contact casting. 10/9; one-week follow-up. Culture I did of the heel last week which was a swab culture admittedly showed moderate Enterococcus faecalis moderate Pseudomonas. The wound itself looks about the same. There is no palpable bone although the depth of it closely approximates bone. The heel is swollen but not erythematous. Her MRI is booked for next week. She also has an MRI of the right elbow. I've also lifted the last consult from Dr. Megan Salon who is following her for septic arthritis of the right elbow. I did not see him specifically comment on her left heel however the patient states that he is aware of this. I did not specifically address the organisms I cultured last week because of the possible effect of any additional cultures that will be done on the elbow. Additionally these were superficial not bone cultures 10/16; her MRI of the heel was  put back to next week. She did have her elbow done. I have been in contact with Dr. Megan Salon about my concerns about osteomyelitis of the left heel. We have been using collagen. She also has a wound on the plantar tip of her first toe 10/23; her MRI of the heel was negative for osteomyelitis but suggested a small abscess. She also had an MRI of her elbow that showed findings compatible with a septic joint. She went to see Dr. Megan Salon and she is going to have both oral and IV antibiotics which I am sure will cover any infection in the heel. I did not actually see his note today. We are using silver collagen offloading the heel with a heel offloading boot 11/6; patient continues with silver collagen to both wound areas on her plantar left heel and plantar left great toe. She remains on daptomycin by Dr. Megan Salon of infectious disease. She complains of diarrhea. Dr. Megan Salon is aware of this. 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 she has been put on oral vancomycin I think a lot of high clinical suspicion for pseudomembranous colitis Because of the right elbow there are no options to completely off her left foot beyond the heel offloading boot we have now. Fortunately the left heel ulcer itself has remained static some improvement in the plantar left great toe 11/20; patient arrives for review of her left heel ulcer. I also note she has a difficult time with septic arthritis of the right elbow. She currently is on IV daptomycin which seems to have helped the drainage in her elbow [MSSA]. Because of high concern for pseudomembranous colitis she was also put on vancomycin orally and Flagyl orally. She was on Levaquin but that was discontinued because of the concern about C. difficile. She states her diarrhea is better. She arrives in clinic today with a large area  of denuded skin on the lateral part of the heel. This had totally separated. We have been using  silver alginate to the wound areas. She arrived in a scooter telling me she is offloading the foot is much as possible. 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 Dr. Megan Salon stopped the daptomycin and now has her on vancomycin 1000 mg IV daily. This is supposed to go onto 1/18. She is being referred to [place] for what sounds like a elbow replacement type operation for her MSSA septic arthritis. She arrives in clinic today with a blister on the medial part of the wound on her heel. She still has a lot of callus around the heel although the surface of the wound on the left heel looks somewhat better. READMISSION 03/19/2020 [name] [name] is a 65 year old woman we have followed for almost all of 2020 with a neuropathic wound on her left heel and the tip of her left great toe.. We she had previously had a partial calcanectomy by Dr. Sharol [name] because of underlying osteomyelitis. She also had a history of MRSA even when we admitted her to the clinic. An MRI when she first came into our clinic did not show osteomyelitis. We put her in a total contact cast and gradually this contracted to the point it was almost closed however in that same timeframe she had a fall. 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 We had to stop putting her in a cast partially because of infection and partially because she could not support herself with the elbow. Things went progressively downhill. When we last saw her at the end of December she had a large open wound on the left heel. She had had recurrent infections in the right elbow. It was felt that either the heel lift: Ice her elbow or vice versa. We never had  MRSA I do not believe cultured in the left heel however. 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 She underwent an operative debridement and a T11-L3 fusion. An operative culture grew Pseudomonas. She was treated with cefepime and required readmission to the hospital with acute kidney failure requiring temporary dialysis. She was discharged to rehab I believe she signed herself out [place]. He has been following with her primary doctor and she comes in the clinic today with miraculously the left heel totally closed. She is followed up with Dr. Megan Salon of infectious disease he does not feel she has any evidence of infection in her elbow or her heel for that matter and the heel is actually fully epithelialized. She is on ciprofloxacin 500 twice daily I think mostly directed at Pseudomonas. The patient is convinced that it was the treatment of the Pseudomonas in her back that ultimately led to closure of her heel and that certainly possible or could have been because she was nonambulatory in the hospital for 3 months according to her. She is still using her heel offloading boot and she says she is "learning to walk again" Readmission: 06/09/2020 patient presents today for reevaluation here in the clinic following a reopening of her heel ulcer. She tells [name] that this actually occurred about 2 weeks after she was last seen in May of this year though it is now the beginning of August and she has not come back until now to see [name]. Nonetheless she does have an  open wound there is been no x-rays at this time infectious disease has been monitoring him following this up until this point. They made the recommendation that she come to see [name]. Fortunately there is no signs of active infection systemically though she does tell me that she had Pseudomonas in the spine when she had her back surgery she is currently on antibiotics for that. 8/20;  this is a patient who I saw 1 time in late May. Apparently shortly after she was here the area on her plantar left heel started to drain again and she had an open wound. She has been going to her primary physician and has been following with Dr. Megan Salon. 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 She is currently on 3 times a day prophylactic ciprofloxacin. She is admitted to the clinic last week for review of the wound on the left heel. Notable for the fact that she had a partial calcanectomy by Dr. Sharol [name] 3 or 4 years ago. She says she is using her motorized wheelchair again as an off loader but she came in in regular shoes. 9/2; 2-week follow-up. Plantar left heel. In spite of debridement last week she had thick very adherent callus around the circumference of the wound and a nonviable gray surface. We have been using Hydrofera Blue. We gave her a heel offloading boot last week. She says she is off her foot except for she needs to walk her dog. 9/17; 2 week follow-up. Lives with a heel ulcer in roughly the same condition as a month ago. Though requiring debridement and revision using Hydrofera Blue. She says she is often except for times that she needs to walk her dog and she claims to be using the heel offloading shoe. She has a new injury which she says was a burn on the medial part of her fifth metacarpophalangeal on the right. 10/15; almost 1 month follow-up. She arrives with the plantar left heel ulcer and not a very good condition. She has thick callus surrounding the wound and a necrotic base over the top of this. I am not sure anybody is paying close attention to this. She says she walks minimally but I highly doubt this. She is supposed to be using Hydrofera Blue. She says she has plenty of supplies. She tells me that she is traveling to [place] next week for a 10-day trip. We will see her  back after that She has both been approved for Dermagraft however I am only willing to attempt this if she is going to agree to a total contact cast. I do not think there is any point in applying this advance dressing when she is walking on this excessively READMISSION 01/10/2021 The patient was last here in October at which time we were dealing with a very difficult wound on the left plantar heel. It was not in very good condition at the time. She tells me she went to [place] on vacation. Shortly after she arrived back her husband was in an accident and then after that she had congestive heart failure secondary to her advanced chronic renal failure. I thought she told me she was admitted to hospital but I certainly do not see this at [facility]. In any case while she was markedly fluid overloaded she developed an open area on the right lateral heel to go along with the original wound on  the left. Both of these completely covered with necrotic surface. I do not know that she is been dressing these at all. She has a heel offloading boot on the left and an ordinary shoe on the right. Before she left last time I did an MRI of the left heel that did not show distinctly osteomyelitis. She was approved for Dermagraft but the wounds are certainly not in a state for application of an advanced treatment product at this point Besides this her past medical history is largely unchanged. 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 Culture at that point showed Pseudomonas. She is not currently on antibiotics. She was on suppressive ciprofloxacin for the spine osteomyelitis but she says this was stopped by Dr.  Megan Salon. Her primary doctor recently gave her a course of doxycycline but she could not tolerate it because of nausea and a rash ABIs in our clinic were 1.13 on the right and 1.15 on the left 3/25; patient comes back to see [name] after re-presenting last week with large ulcers on her bilateral heels. 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 She also has chronic renal failure. 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 She presents spent a prolonged period of time on antibiotics. Culture of the lumbar area I believes had Pseudomonas whereas peripheral cultures of the elbow showed MRSA. She presented last week with deep ulcers on her bilateral plantar heel and the lateral part of her right heel. She has never offloaded this properly. PLAIN X-rays of both heels did not show osteomyelitisin either heel.we've been using silver alginate while we work through the possibilities of coexistent infection/osteomyelitis. She is wearing bilateral heel offloading boots although I've never been certain about the adequacy of her offloading. She also has a scooter 4/1; bilateral heel ulcers the area on the left very deep but does not go to bone. Nevertheless the wound itself is somewhat senescent and lifeless looking. She has her MRI booked for next week we should be able to go over this in a week's time. The area on the right has thick skin and callus and fibrinous debris on the surface. She is offloading with a scooter claims that she is being a lot more careful about offloading than she was in the past. She does not have an arterial issue by clinical exam or ABIs 4/8; bilateral heel ulcers. Both of them look about the same although less callus around the edges. I am still going to continue the silver alginate as there is very little dressing  alternative that would cover the area here perhaps Hydrofera Blue or Sorbact. Her MRI on the left is not till tomorrow. If the MRI was -1 of these areas is going in a total contact cast probably on the right for now 4/14; she did not get her MRI of the left heel because she had silver alginate in it rescheduled for this coming Saturday in 2 days. We will use Hydrofera Blue in both of these wounds the area on the right heel is smaller. If we  get the MRI and there is no osteomyelitis the left heel is going to require debridement of a nonviable surface. Probably Iodoflex or Sorbact. When the surface becomes viable then I would consider a total contact cast plus or minus a skin substitute question Apligraf Objective Constitutional Vitals Time Taken: 4:11 AM, Height: 61 in, Weight: 162 lbs, BMI: 30.6, Temperature: 98.7 F, Pulse: 73 bpm, Respiratory Rate: 18 breaths/min, Blood Pressure: 144/76 mmHg, Capillary Blood Glucose: 109 mg/dl. Cardiovascular Pedal pulses palpable and strong bilaterally.. General Notes: Wound exam ooThe original wound on the left plantar heel looks the same. It is dry the surface looks somewhat lifeless. However there is no exposed bone. I been waiting for the MRI before proceeding with an aggressive debridement of the surface of this to see if we can get more viable tissue. If the MRI is negative then I think she will require perhaps Sorbact or Iodoflex to get to a more viable surface. Then a total contact cast plus or minus a skin substitute. ooThe wound on the right heel actually is contracting she has a lot of callus in this area. Surface of the wound looks quite viable Integumentary (Hair, Skin) Wound #10 status is Open. Original cause of wound was Gradually Appeared. The date acquired was: 09/29/2020. The wound has been in treatment 4 weeks. The wound is located on the Right,Lateral Foot. The wound measures 2.9cm length x 3.1cm width x 0.2cm depth; 7.061cm^2 area and  1.412cm^3 volume. There is Fat Layer (Subcutaneous Tissue) exposed. There is no tunneling or undermining noted. There is a medium amount of serosanguineous drainage noted. The wound margin is distinct with the outline attached to the wound base. There is large (67-100%) red, pink, pale granulation within the wound bed. There is a small (1-33%) amount of necrotic tissue within the wound bed including Adherent Slough. General Notes: Calloused Periwound Wound #9 status is Open. Original cause of wound was Gradually Appeared. The date acquired was: 01/11/2019. The wound has been in treatment 4 weeks. The wound is located on the Left Calcaneus. The wound measures 4.5cm length x 4cm width x 1cm depth; 14.137cm^2 area and 14.137cm^3 volume. There is Fat Layer (Subcutaneous Tissue) exposed. There is no tunneling or undermining noted. There is a medium amount of serosanguineous drainage noted. The wound margin is distinct with the outline attached to the wound base. There is large (67-100%) red, pink, pale granulation within the wound bed. There is a small (1-33%) amount of necrotic tissue within the wound bed including Adherent Slough. 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 Bathing/ Shower/ Hygiene: May shower and wash wound with soap and water. Off-Loading: Open toe surgical shoe to: - Right Foot Heel suspension boot to: - Gloped Heel Offloader to left foot Additional Orders / Instructions: Follow Nutritious Diet WOUND #10: - Foot Wound Laterality: Right, Lateral Cleanser: Soap and Water 1 x Per OEV/03 Days Discharge Instructions: May shower and wash wound with  dial antibacterial soap and water prior to dressing change. Prim Dressing: Hydrofera Blue Classic Foam, 4x4 in (Generic) 1 x Per Day/15 Days ary Discharge Instructions: Moisten with saline prior to applying to wound bed Secondary Dressing: Woven Gauze Sponge, Non-Sterile 4x4 in 1 x Per Day/15  Days Discharge Instructions: Apply over primary dressing as directed. Secondary Dressing: ABD Pad, 5x9 1 x Per Day/15 Days Discharge Instructions: Apply over primary dressing as directed. Secured With: [employer], 4.5x3.1 (in/yd) 1 x Per Day/15 Days Discharge Instructions: Secure with Kerlix as directed. Secured With: [employer] T ape, 2x10 (in/yd) 1 x Per Day/15 Days Discharge Instructions: Secure dressing with tape as directed. WOUND #9: - Calcaneus Wound Laterality: Left Cleanser: Soap and Water 1 x Per VQX/45 Days Discharge Instructions: May shower and wash wound with dial antibacterial soap and water prior to dressing change. Prim Dressing: Hydrofera Blue Classic Foam, 4x4 in 1 x Per Day/15 Days ary Discharge Instructions: Moisten with saline prior to applying to wound bed Secondary Dressing: Woven Gauze Sponge, Non-Sterile 4x4 in 1 x Per Day/15 Days Discharge Instructions: Apply over primary dressing as directed. Secondary Dressing: ABD Pad, 5x9 1 x Per Day/15 Days Discharge Instructions: Apply over primary dressing as directed. Secured With: [employer], 4.5x3.1 (in/yd) 1 x Per Day/15 Days Discharge Instructions: Secure with Kerlix as directed. Secured With: [employer] T ape, 2x10 (in/yd) 1 x Per Day/15 Days Discharge Instructions: Secure dressing with tape as directed. #1 I change the orders to [facility] 2. MRI this Saturday if she has osteomyelitis she will need to go to infectious disease. I do not think she is a candidate for further surgery in this area. Electronic Signature(s) Signed: 02/10/2021 5:23:41 PM By: Linton Ham MD Entered By: Linton Ham on 02/10/2021 17:22:29 -------------------------------------------------------------------------------- SuperBill Details Patient Name: Date of Service: [name], [name] 02/10/2021 Medical Record Number: [id number] Patient Account Number: [id number] Date of Birth/Sex: Treating RN: [birth year] (65 y.o. 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 E11.42 Type 2 diabetes mellitus with diabetic polyneuropathy Facility Procedures CPT4 Code: [id number] Description: 99214 - WOUND CARE VISIT-LEV 4 EST PT Modifier: Quantity: 1 Physician Procedures : CPT4 Code Description Modifier [id number] - WC PHYS LEVEL 3 - EST PT ICD-10 Diagnosis Description L97.423 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

## 2021-02-12 ENCOUNTER — Ambulatory Visit (HOSPITAL_COMMUNITY): Admission: RE | Admit: 2021-02-12 | Payer: PRIVATE HEALTH INSURANCE | Source: Ambulatory Visit

## 2021-02-12 ENCOUNTER — Encounter (HOSPITAL_COMMUNITY): Payer: Self-pay

## 2021-02-15 NOTE — Progress Notes (Signed)
ARDYS, [name] ([id number]) Visit Report for 02/10/2021 Arrival Information Details Patient Name: Date of Service: [name] [name], [name] 02/10/2021 4:00 PM Medical Record Number: [id number] Patient Account Number: [id number] Date of Birth/Sex: Treating RN: [name] (65 y.o. [name] [name], [name] [name] Primary Care [name] [name]: [name] [name] Other Clinician: Referring [name] [name]: Treating [name] [name]/Extender: [name] [name] in Treatment: 4 Visit Information History Since Last Visit Added or deleted any medications: No Patient Arrived: Other Any new allergies or adverse reactions: No Arrival Time: 16:10 Had a fall or experienced change in No Accompanied By: self activities of daily living that may affect Transfer Assistance: None risk of falls: Patient Identification Verified: Yes Signs or symptoms of abuse/neglect since last visito No Secondary Verification Process Completed: Yes Hospitalized since last visit: No Patient Requires Transmission-Based Precautions: No Implantable device outside of the clinic excluding No Patient Has Alerts: No cellular tissue based products placed in the center since last visit: Has Dressing in Place as Prescribed: Yes Pain Present Now: Yes Electronic Signature(s) Signed: 02/15/2021 8:07:32 AM By: [name] [name] Entered By: [name] [name] on 02/10/2021 16:11:19 -------------------------------------------------------------------------------- Clinic Level of Care Assessment Details Patient Name: Date of Service: [name] [name], [name] [name] 02/10/2021 4:00 PM Medical Record Number: [id number] Patient Account Number: [id number] Date of Birth/Sex: Treating RN: [name] [name] (65 y.o. [name] [name], [name] [name] Primary Care [name] [name]: [name] [name] Other Clinician: Referring Coltyn Hanning: Treating Wood Novacek/Extender: [name] [name] in Treatment: 4 Clinic Level of Care Assessment Items TOOL 4 Quantity Score X- 1 0 Use  when only an EandM is performed on FOLLOW-UP visit ASSESSMENTS - Nursing Assessment / Reassessment X- 1 10 Reassessment of Co-morbidities (includes updates in patient status) X- 1 5 Reassessment of Adherence to Treatment Plan ASSESSMENTS - Wound and Skin A ssessment / Reassessment X - Simple Wound Assessment / Reassessment - one wound 1 5 []  - 0 Complex Wound Assessment / Reassessment - multiple wounds X- 1 10 Dermatologic / Skin Assessment (not related to wound area) ASSESSMENTS - Focused Assessment X- 1 5 Circumferential Edema Measurements - multi extremities []  - 0 Nutritional Assessment / Counseling / Intervention []  - 0 Lower Extremity Assessment (monofilament, tuning fork, pulses) []  - 0 Peripheral Arterial Disease Assessment (using hand held doppler) ASSESSMENTS - Ostomy and/or Continence Assessment and Care []  - 0 Incontinence Assessment and Management []  - 0 Ostomy Care Assessment and Management (repouching, etc.) PROCESS - Coordination of Care X - Simple Patient / Family Education for ongoing care 1 15 []  - 0 Complex (extensive) Patient / Family Education for ongoing care X- 1 10 Staff obtains Programmer, systems, Records, T Results / Process Orders est []  - 0 Staff telephones HHA, Nursing Homes / Clarify orders / etc []  - 0 Routine Transfer to another Facility (non-emergent condition) []  - 0 Routine Hospital Admission (non-emergent condition) []  - 0 New Admissions / Biomedical engineer / Ordering NPWT Apligraf, etc. , []  - 0 Emergency Hospital Admission (emergent condition) X- 1 10 Simple Discharge Coordination []  - 0 Complex (extensive) Discharge Coordination PROCESS - Special Needs []  - 0 Pediatric / Minor Patient Management []  - 0 Isolation Patient Management []  - 0 Hearing / Language / Visual special needs []  - 0 Assessment of Community assistance (transportation, D/C planning, etc.) []  - 0 Additional assistance / Altered mentation []  - 0 Support  Surface(s) Assessment (bed, cushion, seat, etc.) INTERVENTIONS - Wound Cleansing / Measurement []  - 0 Simple Wound Cleansing - one wound X- 2 5 Complex Wound Cleansing - multiple  wounds X- 1 5 Wound Imaging (photographs - any number of wounds) []  - 0 Wound Tracing (instead of photographs) []  - 0 Simple Wound Measurement - one wound X- 2 5 Complex Wound Measurement - multiple wounds INTERVENTIONS - Wound Dressings []  - 0 Small Wound Dressing one or multiple wounds X- 2 15 Medium Wound Dressing one or multiple wounds []  - 0 Large Wound Dressing one or multiple wounds []  - 0 Application of Medications - topical []  - 0 Application of Medications - injection INTERVENTIONS - Miscellaneous []  - 0 External ear exam []  - 0 Specimen Collection (cultures, biopsies, blood, body fluids, etc.) []  - 0 Specimen(s) / Culture(s) sent or taken to Lab for analysis []  - 0 Patient Transfer (multiple staff / Civil Service fast streamer / Similar devices) []  - 0 Simple Staple / Suture removal (25 or less) []  - 0 Complex Staple / Suture removal (26 or more) []  - 0 Hypo / Hyperglycemic Management (close monitor of Blood Glucose) []  - 0 Ankle / Brachial Index (ABI) - do not check if billed separately X- 1 5 Vital Signs Has the patient been seen at the hospital within the last three years: Yes Total Score: 130 Level Of Care: New/Established - Level 4 Electronic Signature(s) Signed: 02/10/2021 5:45:48 PM By: [name] Hammock RN Entered By: [name] [name] on 02/10/2021 17:02:10 -------------------------------------------------------------------------------- Encounter Discharge Information Details Patient Name: Date of Service: [name] [name], [name] [name] 02/10/2021 4:00 PM Medical Record Number: [id number] Patient Account Number: [id number] Date of Birth/Sex: Treating RN: [name] (64 y.o. [name] [name] Primary Care [name] [name]: [name] [name] Other Clinician: Referring [name] [name]: Treating  [name] [name]/Extender: [name] [name] in Treatment: 4 Encounter Discharge Information Items Discharge Condition: Stable Ambulatory Status: Wheelchair Discharge Destination: Home Transportation: Private Auto Accompanied By: alone Schedule Follow-up Appointment: Yes Clinical Summary of Care: Patient Declined Electronic Signature(s) Signed: 02/10/2021 6:17:09 PM By: [name] RN, BSN Entered By: [name] on 02/10/2021 18:16:42 -------------------------------------------------------------------------------- Lower Extremity Assessment Details Patient Name: Date of Service: [name] [name], [name] [name] 02/10/2021 4:00 PM Medical Record Number: [id number] Patient Account Number: [id number] Date of Birth/Sex: Treating RN: [name] (65 y.o. Sue Lush Primary Care Renny Remer: [name] [name] Other Clinician: Referring Trusten Hume: Treating Najeh Credit/Extender: [name] [name] in Treatment: 4 Edema Assessment Assessed: Shirlyn Goltz: Yes] Patrice Paradise: Yes] Edema: [Left: Yes] [Right: Yes] Calf Left: Right: Point of Measurement: 35 cm From Medial Instep 35 cm 35 cm Ankle Left: Right: Point of Measurement: 8 cm From Medial Instep 22 cm 23.5 cm Vascular Assessment Pulses: Dorsalis Pedis Palpable: [Left:Yes] [Right:Yes] Electronic Signature(s) Signed: 02/10/2021 6:07:55 PM By: Lorrin Jackson Entered By: Lorrin Jackson on 02/10/2021 16:22:54 -------------------------------------------------------------------------------- Multi Wound Chart Details Patient Name: Date of Service: [name] [name], [name] [name] 02/10/2021 4:00 PM Medical Record Number: [id number] Patient Account Number: [id number] Date of Birth/Sex: Treating RN: [name] (66 y.o. [name] [name], [name] [name] Primary Care Jeweliana Dudgeon: [name] [name] Other Clinician: Referring Taijah Macrae: Treating Jarl Sellitto/Extender: [name] [name] in Treatment: 4 Vital  Signs Height(in): 61 Capillary Blood Glucose(mg/dl): 109 Weight(lbs): 162 Pulse(bpm): 31 Body Mass Index(BMI): 31 Blood Pressure(mmHg): 144/76 Temperature(F): 98.7 Respiratory Rate(breaths/min): 18 Photos: [10:No Photos] [N/A:N/A] Right, Lateral Foot Left Calcaneus N/A Wound Location: Gradually Appeared Gradually Appeared N/A Wounding Event: Diabetic Wound/Ulcer of the Lower Diabetic Wound/Ulcer of the Lower N/A Primary Etiology: Extremity Extremity Cataracts, Anemia, Sleep Apnea, Cataracts, Anemia, Sleep Apnea, N/A Comorbid History: Coronary Artery Disease, Coronary Artery Disease, Hypertension, Myocardial Infarction, Hypertension, Myocardial Infarction, Type II Diabetes, Osteoarthritis,  Type II Diabetes, Osteoarthritis, Osteomyelitis, Neuropathy Osteomyelitis, Neuropathy 09/29/2020 01/11/2019 N/A Date Acquired: 4 4 N/A Weeks of Treatment: Open Open N/A Wound Status: 2.9x3.1x0.2 4.5x4x1 N/A Measurements L x W x D (cm) 7.061 14.137 N/A A (cm) : rea 1.412 14.137 N/A Volume (cm) : 33.40% -21.60% N/A % Reduction in A rea: 90.50% 44.70% N/A % Reduction in Volume: Grade 2 Grade 2 N/A Classification: Medium Medium N/A Exudate A mount: Serosanguineous Serosanguineous N/A Exudate Type: red, brown red, brown N/A Exudate Color: Distinct, outline attached Distinct, outline attached N/A Wound Margin: Large (67-100%) Large (67-100%) N/A Granulation A mount: Red, Pink, Pale Red, Pink, Pale N/A Granulation Quality: Small (1-33%) Small (1-33%) N/A Necrotic A mount: Fat Layer (Subcutaneous Tissue): Yes Fat Layer (Subcutaneous Tissue): Yes N/A Exposed Structures: Fascia: No Fascia: No Tendon: No Tendon: No Muscle: No Muscle: No Joint: No Joint: No Bone: No Bone: No Small (1-33%) Small (1-33%) N/A Epithelialization: Calloused Periwound Callused Periwound N/A Assessment Notes: Treatment Notes Electronic Signature(s) Signed: 02/10/2021 5:23:41 PM By: Linton Ham MD Signed: 02/10/2021 5:45:48 PM By: [name] Hammock RN Entered By: Linton Ham on 02/10/2021 17:16:48 -------------------------------------------------------------------------------- Multi-Disciplinary Care Plan Details Patient Name: Date of Service: [name] [name], [name] [name] 02/10/2021 4:00 PM Medical Record Number: [id number] Patient Account Number: [id number] Date of Birth/Sex: Treating RN: [birth year][name] (65 y.o. [name] [name], [name] [name] Primary Care Liborio Saccente: [name] [name] Other Clinician: Referring Antwione Picotte: Treating Wash Nienhaus/Extender: [name] [name] in Treatment: 4 Active Inactive Pressure Nursing Diagnoses: Knowledge deficit related to management of pressures ulcers Goals: Patient/caregiver will verbalize understanding of pressure ulcer management Date Initiated: 01/10/2021 Target Resolution Date: 02/14/2021 Goal Status: Active Interventions: Assess offloading mechanisms upon admission and as needed Assess potential for pressure ulcer upon admission and as needed Provide education on pressure ulcers Notes: Wound/Skin Impairment Nursing Diagnoses: Impaired tissue integrity Goals: Patient/caregiver will verbalize understanding of skin care regimen Date Initiated: 01/10/2021 Target Resolution Date: 02/14/2021 Goal Status: Active Ulcer/skin breakdown will have a volume reduction of 30% by week 4 Date Initiated: 01/10/2021 Target Resolution Date: 02/14/2021 Goal Status: Active Interventions: Assess patient/caregiver ability to obtain necessary supplies Assess patient/caregiver ability to perform ulcer/skin care regimen upon admission and as needed Assess ulceration(s) every visit Provide education on ulcer and skin care Treatment Activities: Skin care regimen initiated : 01/10/2021 Topical wound management initiated : 01/10/2021 Notes: Electronic Signature(s) Signed: 02/10/2021 5:45:48 PM By: [name] Hammock RN Entered By: [name] [name] on 02/10/2021 17:00:55 -------------------------------------------------------------------------------- Pain Assessment Details Patient Name: Date of Service: [name] [name], [name] [name] 02/10/2021 4:00 PM Medical Record Number: [id number] Patient Account Number: [id number] Date of Birth/Sex: Treating RN: [birth year][name] (65 y.o. [name] [name], [name] [name] Primary Care Netty Sullivant: [name] [name] Other Clinician: Referring Mariachristina Holle: Treating Yehudah Standing/Extender: [name] [name] in Treatment: 4 Active Problems Location of Pain Severity and Description of Pain Patient Has Paino Yes Site Locations Rate the pain. Current Pain Level: 8 Pain Management and Medication Current Pain Management: Electronic Signature(s) Signed: 02/10/2021 5:45:48 PM By: [name] Hammock RN Signed: 02/15/2021 8:07:32 AM By: [name] [name] Entered By: [name] [name] on 02/10/2021 16:12:10 -------------------------------------------------------------------------------- Patient/Caregiver Education Details Patient Name: Date of Service: [name] [name], [name] [name] 4/14/2022andnbsp4:00 PM Medical Record Number: [id number] Patient Account Number: [id number] Date of Birth/Gender: Treating RN: [name] (65 y.o. Benjaman Lobe Primary Care Physician: [name] [name] Other Clinician: Referring Physician: Treating Physician/Extender: [name] [name] in Treatment: 4 Education Assessment Education Provided To: Patient Education Topics Provided Wound/Skin Impairment: Methods: Explain/Verbal Responses:  State content correctly Electronic Signature(s) Signed: 02/10/2021 5:45:48 PM By: [name] Hammock RN Entered By: [name] [name] on 02/10/2021 17:01:08 -------------------------------------------------------------------------------- Wound Assessment Details Patient Name: Date of Service: [name] [name], [name] [name] 02/10/2021 4:00 PM Medical Record Number:  [id number] Patient Account Number: [id number] Date of Birth/Sex: Treating RN: [name] (65 y.o. [name] [name], [name] [name] Primary Care Ethon Wymer: [name] [name] Other Clinician: Referring Treasure Ochs: Treating Herrick Hartog/Extender: [name] [name] in Treatment: 4 Wound Status Wound Number: 10 Primary Diabetic Wound/Ulcer of the Lower Extremity Etiology: Wound Location: Right, Lateral Foot Wound Open Wounding Event: Gradually Appeared Status: Date Acquired: 09/29/2020 Comorbid Cataracts, Anemia, Sleep Apnea, Coronary Artery Disease, Weeks Of Treatment: 4 History: Hypertension, Myocardial Infarction, Type II Diabetes, Clustered Wound: No Osteoarthritis, Osteomyelitis, Neuropathy Wound Measurements Length: (cm) 2.9 Width: (cm) 3.1 Depth: (cm) 0.2 Area: (cm) 7.061 Volume: (cm) 1.412 % Reduction in Area: 33.4% % Reduction in Volume: 90.5% Epithelialization: Small (1-33%) Tunneling: No Undermining: No Wound Description Classification: Grade 2 Wound Margin: Distinct, outline attached Exudate Amount: Medium Exudate Type: Serosanguineous Exudate Color: red, brown Foul Odor After Cleansing: No Slough/Fibrino Yes Wound Bed Granulation Amount: Large (67-100%) Exposed Structure Granulation Quality: Red, Pink, Pale Fascia Exposed: No Necrotic Amount: Small (1-33%) Fat Layer (Subcutaneous Tissue) Exposed: Yes Necrotic Quality: Adherent Slough Tendon Exposed: No Muscle Exposed: No Joint Exposed: No Bone Exposed: No Assessment Notes Calloused Periwound Treatment Notes Wound #10 (Foot) Wound Laterality: Right, Lateral Cleanser Soap and Water Discharge Instruction: May shower and wash wound with dial antibacterial soap and water prior to dressing change. Peri-Wound Care Topical Primary Dressing Hydrofera Blue Classic Foam, 4x4 in Discharge Instruction: Moisten with saline prior to applying to wound bed Secondary Dressing Woven Gauze Sponge,  Non-Sterile 4x4 in Discharge Instruction: Apply over primary dressing as directed. ABD Pad, 5x9 Discharge Instruction: Apply over primary dressing as directed. Secured With [employer], 4.5x3.1 (in/yd) Discharge Instruction: Secure with Kerlix as directed. Transpore Surgical Tape, 2x10 (in/yd) Discharge Instruction: Secure dressing with tape as directed. Compression Wrap Compression Stockings Add-Ons Electronic Signature(s) Signed: 02/10/2021 5:45:48 PM By: [name] Hammock RN Signed: 02/10/2021 6:07:55 PM By: Lorrin Jackson Entered By: Lorrin Jackson on 02/10/2021 16:23:32 -------------------------------------------------------------------------------- Wound Assessment Details Patient Name: Date of Service: [name] [name], [name] [name] 02/10/2021 4:00 PM Medical Record Number: [id number] Patient Account Number: [id number] Date of Birth/Sex: Treating RN: [name] (65 y.o. [name] [name], [name] [name] Primary Care Jenascia Bumpass: [name] [name] Other Clinician: Referring Nataleigh Griffin: Treating Meshelle Holness/Extender: [name] [name] in Treatment: 4 Wound Status Wound Number: 9 Primary Diabetic Wound/Ulcer of the Lower Extremity Etiology: Wound Location: Left Calcaneus Wound Open Wounding Event: Gradually Appeared Status: Date Acquired: 01/11/2019 Comorbid Cataracts, Anemia, Sleep Apnea, Coronary Artery Disease, Weeks Of Treatment: 4 History: Hypertension, Myocardial Infarction, Type II Diabetes, Clustered Wound: No Osteoarthritis, Osteomyelitis, Neuropathy Photos Wound Measurements Length: (cm) 4.5 Width: (cm) 4 Depth: (cm) 1 Area: (cm) 14.137 Volume: (cm) 14.137 % Reduction in Area: -21.6% % Reduction in Volume: 44.7% Epithelialization: Small (1-33%) Tunneling: No Undermining: No Wound Description Classification: Grade 2 Wound Margin: Distinct, outline attached Exudate Amount: Medium Exudate Type: Serosanguineous Exudate Color: red, brown Foul Odor  After Cleansing: No Slough/Fibrino Yes Wound Bed Granulation Amount: Large (67-100%) Exposed Structure Granulation Quality: Red, Pink, Pale Fascia Exposed: No Necrotic Amount: Small (1-33%) Fat Layer (Subcutaneous Tissue) Exposed: Yes Necrotic Quality: Adherent Slough Tendon Exposed: No Muscle Exposed: No Joint Exposed: No Bone Exposed: No Assessment Notes Callused Periwound Treatment Notes Wound #9 (Calcaneus) Wound Laterality: Left Cleanser Soap  and Water Discharge Instruction: May shower and wash wound with dial antibacterial soap and water prior to dressing change. Peri-Wound Care Topical Primary Dressing Hydrofera Blue Classic Foam, 4x4 in Discharge Instruction: Moisten with saline prior to applying to wound bed Secondary Dressing Woven Gauze Sponge, Non-Sterile 4x4 in Discharge Instruction: Apply over primary dressing as directed. ABD Pad, 5x9 Discharge Instruction: Apply over primary dressing as directed. Secured With [employer], 4.5x3.1 (in/yd) Discharge Instruction: Secure with Kerlix as directed. Transpore Surgical Tape, 2x10 (in/yd) Discharge Instruction: Secure dressing with tape as directed. Compression Wrap Compression Stockings Add-Ons Electronic Signature(s) Signed: 02/10/2021 5:45:48 PM By: [name] Hammock RN Signed: 02/15/2021 8:07:32 AM By: [name] [name] Entered By: [name] [name] on 02/10/2021 16:45:45 -------------------------------------------------------------------------------- Vitals Details Patient Name: Date of Service: [name] [name], [name] [name] 02/10/2021 4:00 PM Medical Record Number: [id number] Patient Account Number: [id number] Date of Birth/Sex: Treating RN: [name] (65 y.o. [name] [name], [name] [name] Primary Care Angelli Baruch: [name] [name] Other Clinician: Referring Obryan Radu: Treating Aamori Mcmasters/Extender: [name] [name] in Treatment: 4 Vital Signs Time Taken: 04:11 Temperature (F): 98.7 Height  (in): 61 Pulse (bpm): 73 Weight (lbs): 162 Respiratory Rate (breaths/min): 18 Body Mass Index (BMI): 30.6 Blood Pressure (mmHg): 144/76 Capillary Blood Glucose (mg/dl): 109 Reference Range: 80 - 120 mg / dl Electronic Signature(s) Signed: 02/15/2021 8:07:32 AM By: [name] [name] Entered By: [name] [name] on 02/10/2021 16:11:56

## 2021-02-17 ENCOUNTER — Encounter (HOSPITAL_BASED_OUTPATIENT_CLINIC_OR_DEPARTMENT_OTHER): Payer: PRIVATE HEALTH INSURANCE | Admitting: Internal Medicine

## 2021-02-24 ENCOUNTER — Other Ambulatory Visit: Payer: Self-pay

## 2021-02-24 ENCOUNTER — Encounter (HOSPITAL_BASED_OUTPATIENT_CLINIC_OR_DEPARTMENT_OTHER): Payer: PRIVATE HEALTH INSURANCE | Admitting: Internal Medicine

## 2021-02-24 DIAGNOSIS — L97514 Non-pressure chronic ulcer of other part of right foot with necrosis of bone: Secondary | ICD-10-CM | POA: Diagnosis not present

## 2021-02-24 DIAGNOSIS — L97423 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: Secondary | ICD-10-CM | POA: Diagnosis not present

## 2021-02-24 DIAGNOSIS — E1142 Type 2 diabetes mellitus with diabetic polyneuropathy: Secondary | ICD-10-CM

## 2021-02-24 DIAGNOSIS — E11621 Type 2 diabetes mellitus with foot ulcer: Secondary | ICD-10-CM

## 2021-02-24 NOTE — Progress Notes (Signed)
LIRIO, BACH ([id number]) Visit Report for 02/24/2021 Chief Complaint Document Details Patient Name: Date of Service: [name] [name] RE, [name] 02/24/2021 3:45 PM Medical Record Number: [id number] Patient Account Number: [id number] Date of Birth/Sex: Treating RN: 05/10/1956 (65 y.o. [name] [name] Primary Care Provider: Roma [name] Other Clinician: Referring Provider: Treating Provider/Extender: [name] [name] in Treatment: 6 Information Obtained from: Patient Chief Complaint Left heel ulcer 01/10/2021; patient returns to clinic with 2 large wounds on her bilateral heels Electronic Signature(s) Signed: 02/24/2021 5:24:19 PM By: [name] Shan DO Entered By: [name] [name] on 02/24/2021 17:14:49 -------------------------------------------------------------------------------- Debridement Details Patient Name: Date of Service: [name], [name] 02/24/2021 3:45 PM Medical Record Number: [id number] Patient Account Number: [id number] Date of Birth/Sex: Treating RN: 1955-11-21 (65 y.o. [name] [name], [name] Primary Care Provider: Roma [name] Other Clinician: Referring Provider: Treating Provider/Extender: [name] [name] in Treatment: 6 Debridement Performed for Assessment: Wound #10 Right,Lateral Foot Performed By: Physician [name] Shan, DO Debridement Type: Debridement Severity of Tissue Pre Debridement: Fat layer exposed Level of Consciousness (Pre-procedure): Awake and Alert Pre-procedure Verification/Time Out Yes - 16:28 Taken: Start Time: 16:29 Pain Control: Lidocaine 4% T opical Solution T Area Debrided (L x W): otal 3 (cm) x 3 (cm) = 9 (cm) Tissue and other material debrided: Viable, Non-Viable, Callus, Slough, Subcutaneous, Skin: Dermis , Skin: Epidermis, Fibrin/Exudate, Slough Level: Skin/Subcutaneous Tissue Debridement Description: Excisional Instrument: Curette Bleeding: Minimum Hemostasis  Achieved: Pressure End Time: 16:36 Procedural Pain: 0 Post Procedural Pain: 0 Response to Treatment: Procedure was tolerated well Level of Consciousness (Post- Awake and Alert procedure): Post Debridement Measurements of Total Wound Length: (cm) 2.9 Width: (cm) 2.6 Depth: (cm) 0.2 Volume: (cm) 1.184 Character of Wound/Ulcer Post Debridement: Improved Severity of Tissue Post Debridement: Fat layer exposed Post Procedure Diagnosis Same as Pre-procedure Electronic Signature(s) Signed: 02/24/2021 5:24:19 PM By: [name] Shan DO Signed: 02/24/2021 6:31:57 PM By: [name] [name] Entered By: [name] [name] on 02/24/2021 16:36:33 -------------------------------------------------------------------------------- Debridement Details Patient Name: Date of Service: [name], [name] 02/24/2021 3:45 PM Medical Record Number: [id number] Patient Account Number: [id number] Date of Birth/Sex: Treating RN: 02/09/1956 (65 y.o. [name] [name], [name] Primary Care Provider: Roma [name] Other Clinician: Referring Provider: Treating Provider/Extender: [name] [name] in Treatment: 6 Debridement Performed for Assessment: Wound #9 Left Calcaneus Performed By: Physician [name] Shan, DO Debridement Type: Debridement Severity of Tissue Pre Debridement: Fat layer exposed Level of Consciousness (Pre-procedure): Awake and Alert Pre-procedure Verification/Time Out Yes - 16:28 Taken: Start Time: 16:29 Pain Control: Lidocaine 4% T opical Solution T Area Debrided (L x W): otal 4.8 (cm) x 4 (cm) = 19.2 (cm) Tissue and other material debrided: Viable, Non-Viable, Callus, Slough, Subcutaneous, Skin: Dermis , Skin: Epidermis, Fibrin/Exudate, Slough Level: Skin/Subcutaneous Tissue Debridement Description: Excisional Instrument: Curette Bleeding: Minimum Hemostasis Achieved: Pressure End Time: 16:36 Procedural Pain: 0 Post Procedural Pain: 0 Response to Treatment:  Procedure was tolerated well Level of Consciousness (Post- Awake and Alert procedure): Post Debridement Measurements of Total Wound Length: (cm) 4.5 Width: (cm) 3.8 Depth: (cm) 0.8 Volume: (cm) 10.744 Character of Wound/Ulcer Post Debridement: Improved Severity of Tissue Post Debridement: Fat layer exposed Post Procedure Diagnosis Same as Pre-procedure Electronic Signature(s) Signed: 02/24/2021 5:24:19 PM By: [name] Shan DO Signed: 02/24/2021 6:31:57 PM By: [name] [name] Entered By: [name] [name] on 02/24/2021 16:37:03 -------------------------------------------------------------------------------- HPI Details Patient Name: Date of Service: [name], [name] 02/24/2021 3:45 PM Medical Record Number: [id number] Patient Account  Number: [id number] Date of Birth/Sex: Treating RN: 1956-03-21 (65 y.o. [name] [name] Primary Care Provider: Roma [name] Other Clinician: Referring Provider: Treating Provider/Extender: [name] [name] in Treatment: 6 History of Present Illness HPI Description: ADMISSION 11/12/2018 This is a 65 year old woman with type 2 diabetes and diabetic neuropathy. She has been dealing with a left heel plantar wound for roughly 2 years. She states this started when she pulled some skin off the area and it progressed into a wound. She also has an area on the tip of her left great toe for 1 year. She has been largely followed by Dr. Sharol [name] and she had an excision of bone in the left heel in 2017. I do not see microbiology from this excision or pathology. Apparently this wound never really closed. She most recently has been using Silvadene cream. Offloading this with a scooter. The last MRI I see was in June 2018 which did not show osteomyelitis at that time. Apparently this wound is never really progressed towards healing. During her last review by Dr. Sharol [name] in October it was recommended that she undergo a left BKA and she refused. She  went to see a second orthopedic consult at [facility] who am recommended conservative wound care to see if this will close or progress towards closure but also warned that possible surgery may be necessary. An x-ray that was done at [facility] showed an irregular calcaneal body and calcaneal tuberosity. This is probably postprocedural. Previous x-rays at [facility] had suggested heterotrophic calcifications but I do not see this. Apparently a second ointment was added to the Silvadene which the patient thinks is because some improvement. She has not been systemically unwell. No fever or chills. She thinks the second ointment that was [name] to her at [facility] has helped somewhat. 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 ABIs done in June 2018 showed a ABI of 1.12 on the right and 1.1 on the left she was biphasic bilaterally. ABIs in our clinic were noncompressible today. 11/20/18 on evaluation today patient is seen for her second visit here in the office although this is actually the first visit with me concerning an issue that she's having with her great toe and heel of the left foot. Fortunately she does not appear to be have any discomfort at this time and she does have a scooter in order to offload her foot she also has a boot for offloading. With that being said this is something that appears to have been going on for some time she was seeing Dr. due to his recommendation was that she was going to require a below knee amputation. With that being said she wanted to come to the wound center but according to the patient Dr. Sharol [name] told her that "we could not help her". Nonetheless she saw another provider who  suggested that it may be worth a shot for [name] to try and help her out if it all possible. Nonetheless she decided that it would be worth a trial since otherwise any the way she's gonna end up with an amputation. Obviously if we can heal the wound then that will not be the case. Again I explained to her that  obviously there are no guarantees but we will give this a good try and attempts to get the wound to heal. 1/30; the patient continues to have areas on the left plantar heel and the left plantar great toe. The more worrisome area is the heel. She went for MRI on Saturday but this could not be done because she had silver alginate in the wound bed. I would like to get this rebooked. If she does not have osteomyelitis she will need a total contact cast. We have been using silver alginate in the wounds 2/7; patient has wounds on her left plantar heel and left plantar great toe. The area on the heel has some depth although it looks about the same today. Her MRI will finally be done tomorrow. If she has osteomyelitis in the heel and then we will need to consider her for IV antibiotics and hyperbaric oxygen. If the MRI is negative she will need a total contact cast although she is wearing a cam boot and using a scooter at present. Will be using silver alginate. She will take it off tomorrow in preparation for the MRI 2/14; the patient's MRI showed extensive surgical changes with a large portion of her calcaneus removed on the left secondary to her previous surgery by Dr. Sharol [name] however there is no evidence of osteomyelitis. She would therefore be a candidate for a total contact cast and we applied this for the first time today 2/21; silver collagen total contact cast. The area on the plantar left great toe is "healed" still a lot of callus on this area. Dimensions on the plantar heel not too much different some epithelialization is present however. This is an improvement 12/25/18 on evaluation today patient  actually is seen for follow-up concerning issues that she has been having with her cast she states she feels like it's wet and squishy in the bottom of her cast. The reason she does come in to have this evaluated and see what is going on. Fortunately there's no signs of infection at this time was the cast was removed. 3/6; the patient's area on the tip of her right great toe is callused but there is no open area here. She still has the fairly extensive area in the left heel. We have been using silver collagen in the wound. 01/08/19 on evaluation today patient actually appears to be doing very well in regard to her heel ulcer in my pinion to see if you shown signs of improvement which is excellent news. There's no evidence of infection. She continues to have quite a bit of drainage but fortunately again this doesn't seem to be hindering her healing. 3/20 -Patient's foot ulcer on the left appears to be doing well, the dimensions appear encouraging, we have been using total contact cast with Prisma and will continue doing that 3/27; patient continues to make nice progress on the left plantar heel. She is using silver collagen under a total contact cast. It is been a while since I have seen this wound and it really looks a lot better. Smaller with healthy granulation 4/3; she continues to make nice progress on the left plantar heel. Using silver collagen under a total contact cast 4/10; left plantar heel. Again skin over the surface of the wound with not much in the way of adherence. This results in undermining. Using silver collagen changed to silver alginate 4/17 left plantar heel. Again not much improvement. There is no undermining today no debridement was required we used silver  alginate last time because of excessive moisture 4/24 left plantar heel. Again not as much improvement as I would have liked. About 3 mm of depth. Thick callused skin around the circumference. Using silver alginate 5/1;  left plantar heel this is improved this week. Less depth epithelialization is present. Using silver alginate alginate under a total contact cast 5/8; left plantar heel dimensions are about the same. The depth appears to be improved. I use silver collagen starting today under the cast 5/15 left plantar heel. Arrives today with a 2-day history of feeling like something had "slipped" while walking her dog in the cast. Unfortunately extensive area relatively to the small wound of undermining superiorly denuded epithelium. 5/22-Patient returns at 1 week after being taken off the TCC on account of some fluid collection that was debrided and cultured at last visit from the plantar ulcer on the left heel. The culture results are polymicrobial with Klebsiella, enterococcus, Proteus growth these organisms are sensitive to Cipro except with enterococcus which is sensitive to ampicillin but patient is highly allergic to penicillin according to her. 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 5/29; the patient arrived last week with a new wound on her left plantar great toe. With regards to the culture that I did 2 weeks ago of her deteriorating heel wound this grew Klebsiella and enterococcus. She was [name] Cipro however the enterococcus would not be covered well by a quinolone. She is allergic to penicillin. I will give her linezolid 600 twice daily x5 days 6/12; the patient continues to have a wound on her left and after she returns to the beach there is undermining laterally. She has the new wound from this 2 weeks or so ago on the plantar tip of her left great toe. She had a fall today scraping the dorsal surface of the left fifth 7/14; READMISSION since the patient was last here she was hospitalized from 04/15/2019 through 04/22/2019. She had presented to an outside ER after falling and hitting her elbow. She had had previous  surgery on the elbow and fractures several years ago. An x-ray was negative and she was sent home. She is readmitted with sepsis and acute renal failure secondary to septic arthritis of the elbow. Cultures apparently showed pansensitive staph aureus however she has a severe beta-lactam allergy. She was treated with vancomycin. Apparently the vancomycin is completed and her PICC line is removed although she is seeing Dr. Megan Salon tomorrow due to continued pain and swelling. In the hospital she had an IandD by Dr. [name] of orthopedics. The original surgery was on 04/17/2019 and it was felt that she had septic arthritis. She is still having a lot of pain and swelling in the right elbow and apparently is due to see Dr. Megan Salon tomorrow and what she thinks is that she will be restarted on antibiotics. With regards to her heel wounds/left foot wounds. Her arterial studies were checked and her ABIs were within normal limits. X-ray showed plantar foot ulcer negative for osteomyelitis postop resection of the posterior calcaneus. She has been using silver alginate to the heel 8/4-Patient returns after being seen on 7/14, we are using silver alginate to the calcaneal wound, she is continuing to receive care for her right elbow septic arthritis 8/14- Patient returns after 1 week, she is being seen by the surgical group for her right elbow, she is here for the left calcaneal wound  for which we are using silver alginate this is about the same 8/21; this is a patient I readmitted to the clinic 5 weeks ago. She is continuing to have difficulties with a septic arthritis of the right elbow she had after a fall and apparently has had surgical IandD's since the last time we saw her. She is also following with Dr. Megan Salon of infectious disease and is apparently on 3 oral antibiotics although at the time of this dictation I am not sure what they are. She comes in today with a necrotic surface on the left great toe with  a blister laterally. This was still clearly an open wound. She had purulent drainage coming out of this. The original wound on the plantar aspect of her right heel in the setting of a Charcot foot is deep not open to bone but certainly not any better at all. We have been using silver alginate 8/28; dealing with septic arthritis of the right elbow. May need to go for further surgery here in the second week of September. She had a blister on the left great toe that was purulent [place] draining last week although a culture did not grow anything [already on antibiotics through infectious disease for her elbow]. The punched-out area on her heel is just like it was when she first came in. This almost closed with a total contact cast there are no options for that now. The patient states she cannot stay off her foot having to do housework X-ray of the foot showed a moderate bunion and severe degenerative changes at the first metatarsal phalangeal joint there was a moderate plantar calcaneal spur. We will need to check the location of this. 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 It is oral. We are using silver collagen to the punched-out area on her left heel and to the summer superficial area of the left great toe. She is offloading this is best she can although judging by the amount of callus it is not enough. She thinks she has enough strength in her right elbow now to use her scooter. 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 Hale Bogus notes and infectious disease. We are using silver collagen to the punched-out areas on her left heel and to the plantar aspect of her left great toe she is  now using her scooter to get around and to help offload these areas 10/2; 2-week follow-up. Still on clindamycin I believe for the left elbow she is going for an MRI of the elbow over the weekend. She is then going to see orthopedics and infectious disease. The area on the left heel is certainly no better. This does not probe to bone however there is green drainage. She has an area on the tip of her toe. The area on the heel is in a wound we almost closed at one point with total contact casting. 10/9; one-week follow-up. Culture I did of the heel last week which was a swab culture admittedly showed moderate Enterococcus faecalis moderate Pseudomonas. The wound itself looks about the same. There is no palpable bone although the depth of it closely approximates bone.  The heel is swollen but not erythematous. Her MRI is booked for next week. She also has an MRI of the right elbow. I've also lifted the last consult from Dr. Megan Salon who is following her for septic arthritis of the right elbow. I did not see him specifically comment on her left heel however the patient states that he is aware of this. I did not specifically address the organisms I cultured last week because of the possible effect of any additional cultures that will be done on the elbow. Additionally these were superficial not bone cultures 10/16; her MRI of the heel was put back to next week. She did have her elbow done. I have been in contact with Dr. Megan Salon about my concerns about osteomyelitis of the left heel. We have been using collagen. She also has a wound on the plantar tip of her first toe 10/23; her MRI of the heel was negative for osteomyelitis but suggested a small abscess. She also had an MRI of her elbow that showed findings compatible with a septic joint. She went to see Dr. Megan Salon and she is going to have both oral and IV antibiotics which I am sure will cover any infection in the heel. I did not actually see his note  today. We are using silver collagen offloading the heel with a heel offloading boot 11/6; patient continues with silver collagen to both wound areas on her plantar left heel and plantar left great toe. She remains on daptomycin by Dr. Megan Salon of infectious disease. She complains of diarrhea. Dr. Megan Salon is aware of this. 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 she has been put on oral vancomycin I think a lot of high clinical suspicion for pseudomembranous colitis Because of the right elbow there are no options to completely off her left foot beyond the heel offloading boot we have now. Fortunately the left heel ulcer itself has remained static some improvement in the plantar left great toe 11/20; patient arrives for review of her left heel ulcer. I also note she has a difficult time with septic arthritis of the right elbow. She currently is on IV daptomycin which seems to have helped the drainage in her elbow [MSSA]. Because of high concern for pseudomembranous colitis she was also put on vancomycin orally and Flagyl orally. She was on Levaquin but that was discontinued because of the concern about C. difficile. She states her diarrhea is better. She arrives in clinic today with a large area of denuded skin on the lateral part of the heel. This had totally separated. We have been using silver alginate to the wound areas. She arrived in a scooter telling me she is offloading the foot is much as possible. 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 Dr. Megan Salon stopped the daptomycin and now has her on vancomycin 1000 mg IV daily. This is supposed to go onto 1/18. She is  being referred to [facility] for what sounds like a elbow replacement type operation for her MSSA septic arthritis. She arrives in clinic today with a blister on the medial part of the wound on her heel. She still has a lot of callus around the heel although the  surface of the wound on the left heel looks somewhat better. READMISSION 03/19/2020 Mrs. Mogle is a 65 year old woman we have followed for almost all of 2020 with a neuropathic wound on her left heel and the tip of her left great toe.. We she had previously had a partial calcanectomy by Dr. Sharol [name] because of underlying osteomyelitis. She also had a history of MRSA even when we admitted her to the clinic. An MRI when she first came into our clinic did not show osteomyelitis. We put her in a total contact cast and gradually this contracted to the point it was almost closed however in that same timeframe she had a fall. 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 We had to stop putting her in a cast partially because of infection and partially because she could not support herself with the elbow. Things went progressively downhill. When we last saw her at the end of December she had a large open wound on the left heel. She had had recurrent infections in the right elbow. It was felt that either the heel lift: Ice her elbow or vice versa. We never had MRSA I do not believe cultured in the left heel however. 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 She underwent an operative debridement and a T11-L3 fusion. An operative culture grew Pseudomonas. She was treated with cefepime and required readmission to the hospital with acute kidney failure requiring temporary dialysis. She was discharged to rehab I believe she signed herself out [place]. He has been following with her primary doctor and she comes in the clinic today with miraculously the left heel  totally closed. She is followed up with Dr. Megan Salon of infectious disease he does not feel she has any evidence of infection in her elbow or her heel for that matter and the heel is actually fully epithelialized. She is on ciprofloxacin 500 twice daily I think mostly directed at Pseudomonas. The patient is convinced that it was the treatment of the Pseudomonas in her back that ultimately led to closure of her heel and that certainly possible or could have been because she was nonambulatory in the hospital for 3 months according to her. She is still using her heel offloading boot and she says she is "learning to walk again" Readmission: 06/09/2020 patient presents today for reevaluation here in the clinic following a reopening of her heel ulcer. She tells [name] that this actually occurred about 2 weeks after she was last seen in May of this year though it is now the beginning of August and she has not come back until now to see [name]. Nonetheless she does have an open wound there is been no x-rays at this time infectious disease has been monitoring him following this up until this point. They made the recommendation that she come to see [name]. Fortunately there is no signs of active infection systemically though she does tell me that she had Pseudomonas in the spine when she had her back surgery she is currently on antibiotics for that. 8/20; this is a patient who I saw 1 time in late May. Apparently shortly after she was here the area on her plantar left heel started to drain again and she had an open wound. She has been going to her primary physician and has been following with Dr. Megan Salon. She also has a history of a septic right elbow with MSSA I believe  as well as a Pseudomonas infection in her lower thoracic lumbar spine. She is currently on 3 times a day prophylactic ciprofloxacin. She is admitted to the clinic last week for review of the wound on the left heel. Notable for the fact that she had a  partial calcanectomy by Dr. Sharol [name] 3 or 4 years ago. She says she is using her motorized wheelchair again as an off loader but she came in in regular shoes. 9/2; 2-week follow-up. Plantar left heel. In spite of debridement last week she had thick very adherent callus around the circumference of the wound and a nonviable gray surface. We have been using Hydrofera Blue. We gave her a heel offloading boot last week. She says she is off her foot except for she needs to walk her dog. 9/17; 2 week follow-up. Lives with a heel ulcer in roughly the same condition as a month ago. Though requiring debridement and revision using Hydrofera Blue. She says she is often except for times that she needs to walk her dog and she claims to be using the heel offloading [name]. She has a new injury which she says was a burn on the medial part of her fifth metacarpophalangeal on the right. 10/15; almost 1 month follow-up. She arrives with the plantar left heel ulcer and not a very good condition. She has thick callus surrounding the wound and a necrotic base over the top of this. I am not sure anybody is paying close attention to this. She says she walks minimally but I highly doubt this. She is supposed to be using Hydrofera Blue. She says she has plenty of supplies. She tells me that she is traveling to [place] next week for a 10-day trip. We will see her back after that She has both been approved for Dermagraft however I am only willing to attempt this if she is going to agree to a total contact cast. I do not think there is any point in applying this advance dressing when she is walking on this excessively READMISSION 01/10/2021 The patient was last here in October at which time we were dealing with a very difficult wound on the left plantar heel. It was not in very good condition at the time. She tells me she went to [employer] on vacation. Shortly after she arrived back her husband was in an accident and then  after that she had congestive heart failure secondary to her advanced chronic renal failure. I thought she told me she was admitted to hospital but I certainly do not see this at [facility]. In any case while she was markedly fluid overloaded she developed an open area on the right lateral heel to go along with the original wound on the left. Both of these completely covered with necrotic surface. I do not know that she is been dressing these at all. She has a heel offloading boot on the left and an ordinary [name] on the right. Before she left last time I did an MRI of the left heel that did not show distinctly osteomyelitis. She was approved for Dermagraft but the wounds are certainly not in a state for application of an advanced treatment product at this point Besides this her past medical history is largely unchanged. 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 Culture at that point showed Pseudomonas. She is not currently on antibiotics. She was on suppressive ciprofloxacin for the spine osteomyelitis but she says this was stopped by Dr. Megan Salon. Her primary doctor recently gave her a course of doxycycline but she could not tolerate it because of nausea and a rash ABIs in our clinic were 1.13 on the right and 1.15 on the left 3/25; patient comes back to see [name] after re-presenting last week with large ulcers on her bilateral heels. 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 She also has chronic renal failure. 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 Finally she had  osteomyelitis of T11-L3. She presents spent a prolonged period of time on antibiotics. Culture of the lumbar area I believes had Pseudomonas whereas peripheral cultures of the elbow showed MRSA. She presented last week with deep ulcers on her bilateral plantar heel and the lateral part of her right heel. She has never offloaded this properly. PLAIN X-rays of both heels did not show osteomyelitisin either heel.we've been using silver alginate while we work through the possibilities of coexistent infection/osteomyelitis. She is wearing bilateral heel offloading boots although I've never been certain about the adequacy of her offloading. She also has a scooter 4/1; bilateral heel ulcers the area on the left very deep but does not go to bone. Nevertheless the wound itself is somewhat senescent and lifeless looking. She has her MRI booked for next week we should be able to go over this in a week's time. The area on the right has thick skin and callus and fibrinous debris on the surface. She is offloading with a scooter claims that she is being a lot more careful about offloading than she was in the past. She does not have an arterial issue by clinical exam or ABIs 4/8; bilateral heel ulcers. Both of them look about the same although less callus around the edges. I am still going to continue the silver alginate as there is very little dressing alternative that would cover the area here perhaps Hydrofera Blue or Sorbact. Her MRI on the left is not till tomorrow. If the MRI was -1 of these areas is going in a total contact cast probably on the right for now 4/14; she did not get her MRI of the left heel because she had silver alginate in it rescheduled for this coming Saturday in 2 days. We will use Hydrofera Blue in both of these wounds the area on the right heel is smaller. If we get the MRI and there is no osteomyelitis the left heel is going to require debridement of a nonviable surface. Probably Iodoflex  or Sorbact. When the surface becomes viable then I would consider a total contact cast plus or minus a skin substitute question Apligraf 4/28; patient presents for 2-week follow-up. Her MRI had to be rescheduled because the machine was not working when she went to get the imaging study. It is scheduled for 5/4. She has worked on staying off her heels. She has no issues or complaints today. Electronic Signature(s) Signed: 02/24/2021 5:24:19 PM By: [name] Shan DO Entered By: [name] [name] on 02/24/2021 17:16:20 -------------------------------------------------------------------------------- Physical Exam Details Patient Name: Date of Service: [name], [name] 02/24/2021 3:45 PM Medical Record Number: [id number] Patient  Account Number: [id number] Date of Birth/Sex: Treating RN: 1956/08/21 (65 y.o. [name] [name] Primary Care Provider: Other Clinician: Roma [name] Referring Provider: Treating Provider/Extender: [name] [name] in Treatment: 6 Constitutional respirations regular, non-labored and within target range for patient.. Cardiovascular 2+ dorsalis pedis/posterior tibialis pulses. Psychiatric pleasant and cooperative. Notes Right and left calcaneus with granulation tissue present to the wound bed with circumferential callus. No signs of infection. Electronic Signature(s) Signed: 02/24/2021 5:24:19 PM By: [name] Shan DO Entered By: [name] [name] on 02/24/2021 17:16:38 -------------------------------------------------------------------------------- Physician Orders Details Patient Name: Date of Service: [name], [name] 02/24/2021 3:45 PM Medical Record Number: [id number] Patient Account Number: [id number] Date of Birth/Sex: Treating RN: 1955/12/29 (65 y.o. 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 - Dr. Dellia Nims Return A Bathing/ Shower/ Hygiene May shower and wash wound with soap and water. Edema Control - Lymphedema / SCD / Other Elevate legs to the level of the heart or above for 30 minutes daily and/or when sitting, a frequency of: - throughout the day. Avoid standing for long periods of time. Moisturize legs daily. - both legs every night. Off-Loading Open toe surgical [name] to: - Right Foot Heel suspension boot to: - Gloped Heel Offloader to left foot Additional Orders / Instructions Follow Nutritious Diet Wound Treatment Wound #10 - Foot Wound Laterality: Right, Lateral Cleanser: Soap and Water 1 x Per Day/15 Days Discharge Instructions: May shower and wash wound with dial antibacterial soap and water prior to dressing change. Prim Dressing: Hydrofera Blue Classic Foam, 4x4 in (Generic) 1 x Per Day/15 Days ary Discharge Instructions: Moisten with saline prior to applying to wound bed Secondary Dressing: Woven Gauze Sponge, Non-Sterile 4x4 in 1 x Per Day/15 Days Discharge Instructions: Apply over primary dressing as directed. Secondary Dressing: ABD Pad, 5x9 1 x Per Day/15 Days Discharge Instructions: Apply over primary dressing as directed. Secured With: [employer], 4.5x3.1 (in/yd) 1 x Per Day/15 Days Discharge Instructions: Secure with Kerlix as directed. Secured With: Transpore Surgical Tape, 2x10 (in/yd) 1 x Per Day/15 Days Discharge Instructions: Secure dressing with tape as directed. Wound #9 - Calcaneus Wound Laterality: Left Cleanser: Soap and  Water 1 x Per XIH/03 Days Discharge Instructions: May shower and wash wound with dial antibacterial soap and water prior to dressing change. Prim Dressing: Hydrofera Blue Classic Foam, 4x4 in 1 x Per Day/15 Days ary Discharge Instructions: Moisten with saline prior to applying to wound bed Secondary Dressing: Woven Gauze Sponge, Non-Sterile 4x4 in 1 x Per Day/15 Days Discharge Instructions: Apply over primary dressing as directed. Secondary Dressing: ABD Pad, 5x9 1 x Per Day/15 Days Discharge Instructions: Apply over primary dressing as directed. Secured With: [employer], 4.5x3.1 (in/yd) 1 x Per Day/15 Days Discharge Instructions: Secure with Kerlix as directed. Secured With: Transpore Surgical Tape, 2x10 (in/yd) 1 x Per Day/15 Days Discharge Instructions: Secure dressing with tape as directed. Electronic Signature(s) Signed: 02/24/2021 5:24:19 PM By: [name] Shan DO Entered By: [name] [name] on 02/24/2021 17:16:52 -------------------------------------------------------------------------------- Problem List Details Patient Name: Date of  Service: [name], [name] 02/24/2021 3:45 PM Medical Record Number: [id number] Patient Account Number: [id number] Date of Birth/Sex: Treating RN: 1956/07/07 (65 y.o. 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 02/24/2021 3:45 PM Medical Record Number: [id number] Patient Account Number: [id number] Date of Birth/Sex: Treating RN: 01-13-1956 (65 y.o. [name] [name] Primary Care Provider: Roma [name] Other Clinician: Referring Provider: Treating Provider/Extender: [name] [name] in Treatment: 6 Subjective Chief Complaint Information obtained from Patient Left heel ulcer 01/10/2021; patient returns to clinic with 2 large wounds on her bilateral heels History of Present Illness (HPI) ADMISSION 11/12/2018 This is a 65 year old woman with type 2 diabetes and diabetic neuropathy. She has been dealing with a left heel plantar wound for roughly 2 years. She states this started when she pulled some skin off the area and it progressed into a wound. She also has an area on the tip of her left great toe for 1 year. She has been largely followed by Dr. Sharol [name] and she had an excision of bone in the left heel in 2017. I do not see microbiology from this excision or pathology. Apparently this wound never really closed. She most recently has been using Silvadene cream. Offloading this with a scooter. The last MRI I see was in June 2018 which did not show osteomyelitis at that time. Apparently this wound is never really progressed towards healing. During her last review by Dr. Sharol [name] in October it was recommended that she undergo a left BKA and she refused. She went to see a second orthopedic consult at [facility] who am recommended conservative wound care to see if this will close or progress towards closure but also warned that possible surgery may be necessary. An x-ray that was done at [facility] showed an irregular  calcaneal body and calcaneal tuberosity. This is probably postprocedural. Previous x-rays at [facility] had suggested heterotrophic calcifications but I do not see this. Apparently a second ointment was added to the Silvadene which the patient thinks is because some improvement. She has not been systemically unwell. No fever or chills. She thinks the second ointment that was [name] to her at [facility] has helped somewhat. 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 ABIs done in June 2018 showed a ABI of 1.12 on the right and 1.1 on the left she was biphasic bilaterally. ABIs in our clinic were noncompressible today. 11/20/18 on evaluation today patient is seen for her second visit here in the office although this is actually the first visit with  me concerning an issue that she's having with her great toe and heel of the left foot. Fortunately she does not appear to be have any discomfort at this time and she does have a scooter in order to offload her foot she also has a boot for offloading. With that being said this is something that appears to have been going on for some time she was seeing Dr. due to his recommendation was that she was going to require a below knee amputation. With that being said she wanted to come to the wound center but according to the patient Dr. Sharol [name] told her that "we could not help her". Nonetheless she saw another provider who suggested that it may be worth a shot for [name] to try and help her out if it all possible. Nonetheless she decided that it would be worth a trial since otherwise any the way she's gonna end up with an amputation. Obviously if we can heal the wound then that will not  be the case. Again I explained to her that obviously there are no guarantees but we will give this a good try and attempts to get the wound to heal. 1/30; the patient continues to have areas on the left plantar heel and the left plantar great toe. The more worrisome area is the heel. She went for MRI on Saturday but this could not be done because she had silver alginate in the wound bed. I would like to get this rebooked. If she does not have osteomyelitis she will need a total contact cast. We have been using silver alginate in the wounds 2/7; patient has wounds on her left plantar heel and left plantar great toe. The area on the heel has some depth although it looks about the same today. Her MRI will finally be done tomorrow. If she has osteomyelitis in the heel and then we will need to consider her for IV antibiotics and hyperbaric oxygen. If the MRI is negative she will need a total contact cast although she is wearing a cam boot and using a scooter at present. Will be using silver alginate. She will take it off tomorrow in preparation for the MRI 2/14; the patient's MRI showed extensive surgical changes with a large portion of her calcaneus removed on the left secondary to her previous surgery by Dr. Sharol [name] however there is no evidence of osteomyelitis. She would therefore be a candidate for a total contact cast and we applied this for the first time today 2/21; silver collagen total contact cast. The area on the plantar left great toe is "healed" still a lot of callus on this area. Dimensions on the plantar heel not too much different some epithelialization is present however. This is an improvement 12/25/18 on evaluation today patient actually is seen for follow-up concerning issues that she has been having with her cast she states she feels like it's wet and squishy in the bottom of her cast. The reason she does come in to have this evaluated and see what is going on. Fortunately there's no signs  of infection at this time was the cast was removed. 3/6; the patient's area on the tip of her right great toe is callused but there is no open area here. She still has the fairly extensive area in the left heel. We have been using silver collagen in the wound. 01/08/19 on evaluation today patient actually appears to be doing very well in regard to her heel ulcer in my pinion to  see if you shown signs of improvement which is excellent news. There's no evidence of infection. She continues to have quite a bit of drainage but fortunately again this doesn't seem to be hindering her healing. 3/20 -Patient's foot ulcer on the left appears to be doing well, the dimensions appear encouraging, we have been using total contact cast with Prisma and will continue doing that 3/27; patient continues to make nice progress on the left plantar heel. She is using silver collagen under a total contact cast. It is been a while since I have seen this wound and it really looks a lot better. Smaller with healthy granulation 4/3; she continues to make nice progress on the left plantar heel. Using silver collagen under a total contact cast 4/10; left plantar heel. Again skin over the surface of the wound with not much in the way of adherence. This results in undermining. Using silver collagen changed to silver alginate 4/17 left plantar heel. Again not much improvement. There is no undermining today no debridement was required we used silver alginate last time because of excessive moisture 4/24 left plantar heel. Again not as much improvement as I would have liked. About 3 mm of depth. Thick callused skin around the circumference. Using silver alginate 5/1; left plantar heel this is improved this week. Less depth epithelialization is present. Using silver alginate alginate under a total contact cast 5/8; left plantar heel dimensions are about the same. The depth appears to be improved. I use silver collagen starting today  under the cast 5/15 left plantar heel. Arrives today with a 2-day history of feeling like something had "slipped" while walking her dog in the cast. Unfortunately extensive area relatively to the small wound of undermining superiorly denuded epithelium. 5/22-Patient returns at 1 week after being taken off the TCC on account of some fluid collection that was debrided and cultured at last visit from the plantar ulcer on the left heel. The culture results are polymicrobial with Klebsiella, enterococcus, Proteus growth these organisms are sensitive to Cipro except with enterococcus which is sensitive to ampicillin but patient is highly allergic to penicillin according to her. 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 5/29; the patient arrived last week with a new wound on her left plantar great toe. With regards to the culture that I did 2 weeks ago of her deteriorating heel wound this grew Klebsiella and enterococcus. She was [name] Cipro however the enterococcus would not be covered well by a quinolone. She is allergic to penicillin. I will give her linezolid 600 twice daily x5 days 6/12; the patient continues to have a wound on her left and after she returns to the beach there is undermining laterally. She has the new wound from this 2 weeks or so ago on the plantar tip of her left great toe. She had a fall today scraping the dorsal surface of the left fifth 7/14; READMISSION since the patient was last here she was hospitalized from 04/15/2019 through 04/22/2019. She had presented to an outside ER after falling and hitting her elbow. She had had previous surgery on the elbow and fractures several years ago. An x-ray was negative and she was sent home. She is readmitted with sepsis and acute renal failure secondary to septic arthritis of the elbow. Cultures apparently showed pansensitive staph aureus however she has a  severe beta-lactam allergy. She was treated with vancomycin. Apparently the  vancomycin is completed and her PICC line is removed although she is seeing Dr. Megan Salon tomorrow due to continued pain and swelling. In the hospital she had an IandD by Dr. [name] of orthopedics. The original surgery was on 04/17/2019 and it was felt that she had septic arthritis. She is still having a lot of pain and swelling in the right elbow and apparently is due to see Dr. Megan Salon tomorrow and what she thinks is that she will be restarted on antibiotics. With regards to her heel wounds/left foot wounds. Her arterial studies were checked and her ABIs were within normal limits. X-ray showed plantar foot ulcer negative for osteomyelitis postop resection of the posterior calcaneus. She has been using silver alginate to the heel 8/4-Patient returns after being seen on 7/14, we are using silver alginate to the calcaneal wound, she is continuing to receive care for her right elbow septic arthritis 8/14- Patient returns after 1 week, she is being seen by the surgical group for her right elbow, she is here for the left calcaneal wound for which we are using silver alginate this is about the same 8/21; this is a patient I readmitted to the clinic 5 weeks ago. She is continuing to have difficulties with a septic arthritis of the right elbow she had after a fall and apparently has had surgical IandD's since the last time we saw her. She is also following with Dr. Megan Salon of infectious disease and is apparently on 3 oral antibiotics although at the time of this dictation I am not sure what they are. She comes in today with a necrotic surface on the left great toe with a blister laterally. This was still clearly an open wound. She had purulent drainage coming out of this. The original wound on the plantar aspect of her right heel in the setting of a Charcot foot is deep not open to bone but certainly not any better at all. 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 May need to go for further surgery here in the second week of September. She had a blister on the left great toe that was purulent [place] draining last week although a culture did not grow anything [already on antibiotics through infectious disease for her elbow]. The punched-out area on her heel is just like it was when she first came in. This almost closed with a total contact cast there are no options for that now. The patient states she cannot stay off her foot having to do housework X-ray of the foot showed a moderate bunion and severe degenerative changes at the first metatarsal phalangeal joint there was a moderate plantar calcaneal spur. We will need to check the location of this. 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 It is oral. We are using silver collagen to the punched-out area on her left heel and to the summer superficial area of the left great toe. She is offloading this is best she can although judging by the amount of callus it is not enough. She thinks she has enough strength in her right elbow now to use her scooter. There is not an option for a cast 9/18; still dealing with septic arthritis of the elbow she is apparently going for an MRI and a possible procedure next week she  is on clindamycin and I have reviewed this with Dr. Hale Bogus notes and infectious disease. We are using silver collagen to the punched-out areas on her left heel and to the plantar aspect of her left great toe she is now using her scooter to get around and to help offload these areas 10/2; 2-week follow-up. Still on clindamycin I believe for the left elbow she is going for an MRI of the elbow over the weekend. She is then going to see orthopedics and infectious disease. The  area on the left heel is certainly no better. This does not probe to bone however there is green drainage. She has an area on the tip of her toe. The area on the heel is in a wound we almost closed at one point with total contact casting. 10/9; one-week follow-up. Culture I did of the heel last week which was a swab culture admittedly showed moderate Enterococcus faecalis moderate Pseudomonas. The wound itself looks about the same. There is no palpable bone although the depth of it closely approximates bone. The heel is swollen but not erythematous. Her MRI is booked for next week. She also has an MRI of the right elbow. I've also lifted the last consult from Dr. Megan Salon who is following her for septic arthritis of the right elbow. I did not see him specifically comment on her left heel however the patient states that he is aware of this. I did not specifically address the organisms I cultured last week because of the possible effect of any additional cultures that will be done on the elbow. Additionally these were superficial not bone cultures 10/16; her MRI of the heel was put back to next week. She did have her elbow done. I have been in contact with Dr. Megan Salon about my concerns about osteomyelitis of the left heel. We have been using collagen. She also has a wound on the plantar tip of her first toe 10/23; her MRI of the heel was negative for osteomyelitis but suggested a small abscess. She also had an MRI of her elbow that showed findings compatible with a septic joint. She went to see Dr. Megan Salon and she is going to have both oral and IV antibiotics which I am sure will cover any infection in the heel. I did not actually see his note today. We are using silver collagen offloading the heel with a heel offloading boot 11/6; patient continues with silver collagen to both wound areas on her plantar left heel and plantar left great toe. She remains on daptomycin by Dr. Megan Salon of infectious  disease. She complains of diarrhea. Dr. Megan Salon is aware of this. 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 she has been put on oral vancomycin I think a lot of high clinical suspicion for pseudomembranous colitis Because of the right elbow there are no options to completely off her left foot beyond the heel offloading boot we have now. Fortunately the left heel ulcer itself has remained static some improvement in the plantar left great toe 11/20; patient arrives for review of her left heel ulcer. I also note she has a difficult time with septic arthritis of the right elbow. She currently is on IV daptomycin which seems to have helped the drainage in her elbow [MSSA]. Because of high concern for pseudomembranous colitis she was also put on vancomycin orally and Flagyl  orally. She was on Levaquin but that was discontinued because of the concern about C. difficile. She states her diarrhea is better. She arrives in clinic today with a large area of denuded skin on the lateral part of the heel. This had totally separated. We have been using silver alginate to the wound areas. She arrived in a scooter telling me she is offloading the foot is much as possible. 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 Dr. Megan Salon stopped the daptomycin and now has her on vancomycin 1000 mg IV daily. This is supposed to go onto 1/18. She is being referred to [facility] for what sounds like a elbow replacement type operation for her MSSA septic arthritis. She arrives in clinic today with a blister on the medial part of the wound on her heel. She still has a lot of callus around the heel  although the surface of the wound on the left heel looks somewhat better. READMISSION 03/19/2020 Mrs. Mcgirr is a 65 year old woman we have followed for almost all of 2020 with a neuropathic wound on her left heel and the tip of her left great toe.. We she had previously had a partial calcanectomy by Dr. Sharol [name] because of underlying osteomyelitis. She also had a history of MRSA even when we admitted her to the clinic. An MRI when she first came into our clinic did not show osteomyelitis. We put her in a total contact cast and gradually this contracted to the point it was almost closed however in that same timeframe she had a fall. 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 We had to stop putting her in a cast partially because of infection and partially because she could not support herself with the elbow. Things went progressively downhill. When we last saw her at the end of December she had a large open wound on the left heel. She had had recurrent infections in the right elbow. It was felt that either the heel lift: Ice her elbow or vice versa. We never had MRSA I do not believe cultured in the left heel however. 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 She underwent an operative debridement and a T11-L3 fusion. An operative culture grew Pseudomonas. She was treated with cefepime and required readmission to the hospital with acute kidney failure requiring temporary dialysis. She was discharged to rehab I believe she signed herself out [place]. He has been following with her primary doctor and she comes in the clinic today with miraculously the left heel totally closed. She is followed up with Dr. Megan Salon of infectious disease he does not feel she has any evidence of infection in her elbow or her heel for that matter and the heel is actually fully epithelialized. She is on ciprofloxacin 500 twice daily I  think mostly directed at Pseudomonas. The patient is convinced that it was the treatment of the Pseudomonas in her back that ultimately led to closure of her heel and that certainly possible or could have been because she was nonambulatory in the hospital for 3 months according to her. She is still using her heel offloading boot and she says she is "learning to walk again" Readmission: 06/09/2020 patient presents today for reevaluation here in the clinic following  a reopening of her heel ulcer. She tells [name] that this actually occurred about 2 weeks after she was last seen in May of this year though it is now the beginning of August and she has not come back until now to see [name]. Nonetheless she does have an open wound there is been no x-rays at this time infectious disease has been monitoring him following this up until this point. They made the recommendation that she come to see [name]. Fortunately there is no signs of active infection systemically though she does tell me that she had Pseudomonas in the spine when she had her back surgery she is currently on antibiotics for that. 8/20; this is a patient who I saw 1 time in late May. Apparently shortly after she was here the area on her plantar left heel started to drain again and she had an open wound. She has been going to her primary physician and has been following with Dr. Megan Salon. 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 She is currently on 3 times a day prophylactic ciprofloxacin. She is admitted to the clinic last week for review of the wound on the left heel. Notable for the fact that she had a partial calcanectomy by Dr. Sharol [name] 3 or 4 years ago. She says she is using her motorized wheelchair again as an off loader but she came in in regular shoes. 9/2; 2-week follow-up. Plantar left heel. In spite of debridement last week she had thick very adherent  callus around the circumference of the wound and a nonviable gray surface. We have been using Hydrofera Blue. We gave her a heel offloading boot last week. She says she is off her foot except for she needs to walk her dog. 9/17; 2 week follow-up. Lives with a heel ulcer in roughly the same condition as a month ago. Though requiring debridement and revision using Hydrofera Blue. She says she is often except for times that she needs to walk her dog and she claims to be using the heel offloading [name]. She has a new injury which she says was a burn on the medial part of her fifth metacarpophalangeal on the right. 10/15; almost 1 month follow-up. She arrives with the plantar left heel ulcer and not a very good condition. She has thick callus surrounding the wound and a necrotic base over the top of this. I am not sure anybody is paying close attention to this. She says she walks minimally but I highly doubt this. She is supposed to be using Hydrofera Blue. She says she has plenty of supplies. She tells me that she is traveling to [place] next week for a 10-day trip. We will see her back after that She has both been approved for Dermagraft however I am only willing to attempt this if she is going to agree to a total contact cast. I do not think there is any point in applying this advance dressing when she is walking on this excessively READMISSION 01/10/2021 The patient was last here in October at which time we were dealing with a very difficult wound on the left plantar heel. It was not in very good condition at the time. She tells me she went to [place] on vacation. Shortly after she arrived back her husband was in an accident and then after that she had congestive heart failure secondary to her advanced chronic  renal failure. I thought she told me she was admitted to hospital but I certainly do not see this at [facility]. In any case while she was markedly fluid overloaded she developed an  open area on the right lateral heel to go along with the original wound on the left. Both of these completely covered with necrotic surface. I do not know that she is been dressing these at all. She has a heel offloading boot on the left and an ordinary [name] on the right. Before she left last time I did an MRI of the left heel that did not show distinctly osteomyelitis. She was approved for Dermagraft but the wounds are certainly not in a state for application of an advanced treatment product at this point Besides this her past medical history is largely unchanged. 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 Culture at that point showed Pseudomonas. She is not currently on antibiotics. She was on suppressive ciprofloxacin for the spine osteomyelitis but she says this was stopped by Dr. Megan Salon. Her primary doctor recently gave her a course of doxycycline but she could not tolerate it because of nausea and a rash ABIs in our clinic were 1.13 on the right and 1.15 on the left 3/25; patient comes back to see [name] after re-presenting last week with large ulcers on her bilateral heels. 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 She also has chronic renal failure. 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 She presents spent a prolonged period of time on antibiotics. Culture of the lumbar area I believes had Pseudomonas whereas peripheral cultures of the elbow showed MRSA. She presented last week with deep ulcers on her bilateral plantar heel  and the lateral part of her right heel. She has never offloaded this properly. PLAIN X-rays of both heels did not show osteomyelitisin either heel.we've been using silver alginate while we work through the possibilities of coexistent infection/osteomyelitis. She is wearing bilateral heel offloading boots although I've never been certain about the adequacy of her offloading. She also has a scooter 4/1; bilateral heel ulcers the area on the left very deep but does not go to bone. Nevertheless the wound itself is somewhat senescent and lifeless looking. She has her MRI booked for next week we should be able to go over this in a week's time. The area on the right has thick skin and callus and fibrinous debris on the surface. She is offloading with a scooter claims that she is being a lot more careful about offloading than she was in the past. She does not have an arterial issue by clinical exam or ABIs 4/8; bilateral heel ulcers. Both of them look about the same although less callus around the edges. I am still going to continue the silver alginate as there is very little dressing alternative that would cover the area here perhaps Hydrofera Blue or Sorbact. Her MRI on the left is not till tomorrow. If the MRI was -1 of these areas is going in a total contact cast probably on the  right for now 4/14; she did not get her MRI of the left heel because she had silver alginate in it rescheduled for this coming Saturday in 2 days. We will use Hydrofera Blue in both of these wounds the area on the right heel is smaller. If we get the MRI and there is no osteomyelitis the left heel is going to require debridement of a nonviable surface. Probably Iodoflex or Sorbact. When the surface becomes viable then I would consider a total contact cast plus or minus a skin substitute question Apligraf 4/28; patient presents for 2-week follow-up. Her MRI had to be rescheduled because the machine was not working when she went to  get the imaging study. It is scheduled for 5/4. She has worked on staying off her heels. She has no issues or complaints today. Patient History Information obtained from Patient. Family History Diabetes - Maternal Grandparents,Paternal Grandparents,Father,Siblings, Heart Disease - [place], Hypertension - Father, No family history of Cancer, Hereditary Spherocytosis, Kidney Disease, Lung Disease, Seizures, Stroke, Thyroid Problems, Tuberculosis. Social History Never smoker, Marital Status - Married, Alcohol Use - Never, Drug Use - No History, Caffeine Use - Moderate - tea. Medical History Eyes Patient has history of Cataracts - left eye removed, mild right eye Denies history of Glaucoma, Optic Neuritis Ear/Nose/Mouth/Throat Denies history of Chronic sinus problems/congestion, Middle ear problems Hematologic/Lymphatic Patient has history of Anemia Denies history of Hemophilia, Human Immunodeficiency Virus, Lymphedema, Sickle Cell Disease Respiratory Patient has history of Sleep Apnea Denies history of Aspiration, Asthma, Chronic Obstructive Pulmonary Disease (COPD), Pneumothorax, Tuberculosis Cardiovascular Patient has history of Coronary Artery Disease, Hypertension, Myocardial Infarction - 2014 Gastrointestinal Denies history of Cirrhosis , Colitis, Crohnoos, Hepatitis A, Hepatitis B, Hepatitis C Endocrine Patient has history of Type II Diabetes Denies history of Type I Diabetes Immunological Denies history of Lupus Erythematosus, Raynaudoos, Scleroderma Integumentary (Skin) Denies history of History of Burn Musculoskeletal Patient has history of Osteoarthritis, Osteomyelitis - left calcaneus and spine Denies history of Gout, Rheumatoid Arthritis Neurologic Patient has history of Neuropathy Denies history of Dementia, Quadriplegia, Paraplegia, Seizure Disorder Oncologic Denies history of Received Chemotherapy, Received Radiation Psychiatric Denies history of  Anorexia/bulimia, Confinement Anxiety Hospitalization/Surgery History - left heel surgery. - hysterectomy. - cholecystectomy. - right elbow surgery x4. - December 2021. Medical A Surgical History Notes nd Constitutional Symptoms (General Health) morbid obesity, fibromyalgia Eyes macular degeneration left eye Ear/Nose/Mouth/Throat seasonal allergies Cardiovascular hyperlipidemia Gastrointestinal fatty liver Genitourinary CKD stage 3 Neurologic cervical ruptured disc Objective Constitutional respirations regular, non-labored and within target range for patient.. Vitals Time Taken: 3:55 PM, Height: 61 in, Weight: 162 lbs, BMI: 30.6, Temperature: 99.1 F, Pulse: 87 bpm, Respiratory Rate: 18 breaths/min, Blood Pressure: 148/79 mmHg, Capillary Blood Glucose: 114 mg/dl. Cardiovascular 2+ dorsalis pedis/posterior tibialis pulses. Psychiatric pleasant and cooperative. General Notes: Right and left calcaneus with granulation tissue present to the wound bed with circumferential callus. No signs of infection. Integumentary (Hair, Skin) Wound #10 status is Open. Original cause of wound was Gradually Appeared. The date acquired was: 09/29/2020. The wound has been in treatment 6 weeks. The wound is located on the Right,Lateral Foot. The wound measures 2.9cm length x 2.6cm width x 0.2cm depth; 5.922cm^2 area and 1.184cm^3 volume. There is Fat Layer (Subcutaneous Tissue) exposed. There is no tunneling or undermining noted. There is a medium amount of serosanguineous drainage noted. The wound margin is distinct with the outline attached to the wound base. There is large (67-100%) red, pink granulation within the wound bed. There is a  small (1- 33%) amount of necrotic tissue within the wound bed including Adherent Slough. General Notes: Calloused Periwound Wound #9 status is Open. Original cause of wound was Gradually Appeared. The date acquired was: 01/11/2019. The wound has been in treatment 6  weeks. The wound is located on the Left Calcaneus. The wound measures 4.5cm length x 3.8cm width x 0.8cm depth; 13.43cm^2 area and 10.744cm^3 volume. There is Fat Layer (Subcutaneous Tissue) exposed. There is no tunneling or undermining noted. There is a medium amount of serosanguineous drainage noted. The wound margin is distinct with the outline attached to the wound base. There is large (67-100%) red, pink, pale granulation within the wound bed. There is a small (1-33%) amount of necrotic tissue within the wound bed including Adherent Slough. 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 There is granulation tissue with surrounding callus and this was debrided. We will continue with Hydrofera Blue. MRI is scheduled for next week. She will follow-up in 1 week with Dr. Dellia Nims Procedures Wound #10 Pre-procedure diagnosis of Wound #10 is a Diabetic Wound/Ulcer of the Lower Extremity located on the Right,Lateral Foot .Severity of Tissue Pre Debridement is: Fat layer exposed. There was a Excisional Skin/Subcutaneous Tissue Debridement with a total area of 9 sq cm performed by [name] Shan, DO. With the following instrument(s): Curette to remove Viable and Non-Viable tissue/material. Material removed includes Callus, Subcutaneous Tissue, Slough, Skin: Dermis, Skin: Epidermis, and Fibrin/Exudate after achieving pain control using Lidocaine 4% Topical Solution. A time out was conducted at 16:28, prior to the start of the procedure. A Minimum amount of bleeding was controlled with Pressure. The procedure was tolerated well with a pain level of 0 throughout and a  pain level of 0 following the procedure. Post Debridement Measurements: 2.9cm length x 2.6cm width x 0.2cm depth; 1.184cm^3 volume. Character of Wound/Ulcer Post Debridement is improved. Severity of Tissue Post Debridement is: Fat layer exposed. Post procedure Diagnosis Wound #10: Same as Pre-Procedure Wound #9 Pre-procedure diagnosis of Wound #9 is a Diabetic Wound/Ulcer of the Lower Extremity located on the Left Calcaneus .Severity of Tissue Pre Debridement is: Fat layer exposed. There was a Excisional Skin/Subcutaneous Tissue Debridement with a total area of 19.2 sq cm performed by [name] Shan, DO. With the following instrument(s): Curette to remove Viable and Non-Viable tissue/material. Material removed includes Callus, Subcutaneous Tissue, Slough, Skin: Dermis, Skin: Epidermis, and Fibrin/Exudate after achieving pain control using Lidocaine 4% T opical Solution. A time out was conducted at 16:28, prior to the start of the procedure. A Minimum amount of bleeding was controlled with Pressure. The procedure was tolerated well with a pain level of 0 throughout and a pain level of 0 following the procedure. Post Debridement Measurements: 4.5cm length x 3.8cm width x 0.8cm depth; 10.744cm^3 volume. Character of Wound/Ulcer Post Debridement is improved. Severity of Tissue Post Debridement is: Fat layer exposed. Post procedure Diagnosis Wound #9: Same as Pre-Procedure Plan Follow-up Appointments: Return Appointment in 1 week. - Dr. Dellia Nims Bathing/ Shower/ Hygiene: May shower and wash wound with soap and water. Edema Control - Lymphedema / SCD / Other: Elevate legs to the level of the heart or above for 30 minutes daily and/or when sitting,  a frequency of: - throughout the day. Avoid standing for long periods of time. Moisturize legs daily. - both legs every night. Off-Loading: Open toe surgical [name] to: - Right Foot Heel suspension boot to: - Gloped Heel Offloader to left foot Additional  Orders / Instructions: Follow Nutritious Diet WOUND #10: - Foot Wound Laterality: Right, Lateral Cleanser: Soap and Water 1 x Per FBP/10 Days Discharge Instructions: May shower and wash wound with dial antibacterial soap and water prior to dressing change. Prim Dressing: Hydrofera Blue Classic Foam, 4x4 in (Generic) 1 x Per Day/15 Days ary Discharge Instructions: Moisten with saline prior to applying to wound bed Secondary Dressing: Woven Gauze Sponge, Non-Sterile 4x4 in 1 x Per Day/15 Days Discharge Instructions: Apply over primary dressing as directed. Secondary Dressing: ABD Pad, 5x9 1 x Per Day/15 Days Discharge Instructions: Apply over primary dressing as directed. Secured With: [employer], 4.5x3.1 (in/yd) 1 x Per Day/15 Days Discharge Instructions: Secure with Kerlix as directed. Secured With: [employer] T ape, 2x10 (in/yd) 1 x Per Day/15 Days Discharge Instructions: Secure dressing with tape as directed. WOUND #9: - Calcaneus Wound Laterality: Left Cleanser: Soap and Water 1 x Per CHE/52 Days Discharge Instructions: May shower and wash wound with dial antibacterial soap and water prior to dressing change. Prim Dressing: Hydrofera Blue Classic Foam, 4x4 in 1 x Per Day/15 Days ary Discharge Instructions: Moisten with saline prior to applying to wound bed Secondary Dressing: Woven Gauze Sponge, Non-Sterile 4x4 in 1 x Per Day/15 Days Discharge Instructions: Apply over primary dressing as directed. Secondary Dressing: ABD Pad, 5x9 1 x Per Day/15 Days Discharge Instructions: Apply over primary dressing as directed. Secured With: [employer], 4.5x3.1 (in/yd) 1 x Per Day/15 Days Discharge Instructions: Secure with Kerlix as directed. Secured With: [employer] T ape, 2x10 (in/yd) 1 x Per Day/15 Days Discharge Instructions: Secure dressing with tape as directed. 1. Hydrofera Blue 2. Aggressive offloading 3. Follow-up with Dr. Dellia Nims in 1 week Electronic  Signature(s) Signed: 02/24/2021 5:24:19 PM By: [name] Shan DO Entered By: [name] [name] on 02/24/2021 17:19:18 -------------------------------------------------------------------------------- HxROS Details Patient Name: Date of Service: [place], [name] 02/24/2021 3:45 PM Medical Record Number: [id number] Patient Account Number: [id number] Date of Birth/Sex: Treating RN: 07/31/1956 (65 y.o. [name] [name], Tammi Klippel Primary Care Provider: Roma [name] Other Clinician: Referring Provider: Treating Provider/Extender: [name] [name] in Treatment: 6 Information Obtained From Patient Constitutional Symptoms (General Health) Medical History: Past Medical History Notes: morbid obesity, fibromyalgia Eyes Medical History: Positive for: Cataracts - left eye removed, mild right eye Negative for: Glaucoma; Optic Neuritis Past Medical History Notes: macular degeneration left eye Ear/Nose/Mouth/Throat Medical History: Negative for: Chronic sinus problems/congestion; Middle ear problems Past Medical History Notes: seasonal allergies Hematologic/Lymphatic Medical History: Positive for: Anemia Negative for: Hemophilia; Human Immunodeficiency Virus; Lymphedema; Sickle Cell Disease Respiratory Medical History: Positive for: Sleep Apnea Negative for: Aspiration; Asthma; Chronic Obstructive Pulmonary Disease (COPD); Pneumothorax; Tuberculosis Cardiovascular Medical History: Positive for: Coronary Artery Disease; Hypertension; Myocardial Infarction - 2014 Past Medical History Notes: hyperlipidemia Gastrointestinal Medical History: Negative for: Cirrhosis ; Colitis; Crohns; Hepatitis A; Hepatitis B; Hepatitis C Past Medical History Notes: fatty liver Endocrine Medical History: Positive for: Type II Diabetes Negative for: Type I Diabetes Time with diabetes: 9 yrs Treated with: Insulin Blood sugar tested every day: Yes T ested : 3 times per  day Blood sugar testing results: Breakfast: 100; Lunch: 120; Dinner: 120-135 Genitourinary Medical History: Past Medical History Notes: CKD stage  3 Immunological Medical History: Negative for: Lupus Erythematosus; Raynauds; Scleroderma Integumentary (Skin) Medical History: Negative for: History of Burn Musculoskeletal Medical History: Positive for: Osteoarthritis; Osteomyelitis - left calcaneus and spine Negative for: Gout; Rheumatoid Arthritis Neurologic Medical History: Positive for: Neuropathy Negative for: Dementia; Quadriplegia; Paraplegia; Seizure Disorder Past Medical History Notes: cervical ruptured disc Oncologic Medical History: Negative for: Received Chemotherapy; Received Radiation Psychiatric Medical History: Negative for: Anorexia/bulimia; Confinement Anxiety HBO Extended History Items Eyes: Cataracts Immunizations Pneumococcal Vaccine: Received Pneumococcal Vaccination: Yes Implantable Devices None Hospitalization / Surgery History Type of Hospitalization/Surgery left heel surgery hysterectomy cholecystectomy right elbow surgery x4 December 2021 Family and Social History Cancer: No; Diabetes: Yes - Maternal Grandparents,Paternal Grandparents,Father,Siblings; Heart Disease: Yes - Mother,Father,Siblings; Hereditary Spherocytosis: No; Hypertension: Yes - Father; Kidney Disease: No; Lung Disease: No; Seizures: No; Stroke: No; Thyroid Problems: No; Tuberculosis: No; Never smoker; Marital Status - Married; Alcohol Use: Never; Drug Use: No History; Caffeine Use: Moderate - tea; Financial Concerns: No; Food, Clothing or Shelter Needs: No; Support System Lacking: No; Transportation Concerns: No Electronic Signature(s) Signed: 02/24/2021 5:24:19 PM By: [name] Shan DO Signed: 02/24/2021 6:31:57 PM By: [name] [name] Entered By: [name] [name] on 02/24/2021 17:16:30 -------------------------------------------------------------------------------- SuperBill  Details Patient Name: Date of Service: [name], [name] 02/24/2021 Medical Record Number: [id number] Patient Account Number: [id number] Date of Birth/Sex: Treating RN: 11/18/1955 (65 y.o. 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

## 2021-02-25 ENCOUNTER — Ambulatory Visit: Admit: 2021-02-25 | Discharge: 2021-02-26 | Payer: MEDICARE

## 2021-02-25 ENCOUNTER — Encounter: Admit: 2021-02-25 | Discharge: 2021-02-25 | Payer: MEDICARE

## 2021-02-25 DIAGNOSIS — G5600 Carpal tunnel syndrome, unspecified upper limb: Secondary | ICD-10-CM

## 2021-02-25 DIAGNOSIS — E785 Hyperlipidemia, unspecified: Secondary | ICD-10-CM

## 2021-02-25 DIAGNOSIS — K580 Irritable bowel syndrome with diarrhea: Secondary | ICD-10-CM

## 2021-02-25 DIAGNOSIS — E119 Type 2 diabetes mellitus without complications: Secondary | ICD-10-CM

## 2021-02-25 DIAGNOSIS — N183 Stage 3 chronic kidney disease, unspecified whether stage 3a or 3b CKD (HCC): Secondary | ICD-10-CM

## 2021-02-25 DIAGNOSIS — M79642 Pain in left hand: Secondary | ICD-10-CM

## 2021-02-25 DIAGNOSIS — K2970 Gastritis, unspecified, without bleeding: Secondary | ICD-10-CM

## 2021-02-25 DIAGNOSIS — E559 Vitamin D deficiency, unspecified: Secondary | ICD-10-CM

## 2021-02-25 DIAGNOSIS — I10 Essential (primary) hypertension: Secondary | ICD-10-CM

## 2021-02-25 DIAGNOSIS — G9050 Complex regional pain syndrome I, unspecified: Secondary | ICD-10-CM

## 2021-02-25 DIAGNOSIS — J309 Allergic rhinitis, unspecified: Secondary | ICD-10-CM

## 2021-02-25 DIAGNOSIS — D649 Anemia, unspecified: Secondary | ICD-10-CM

## 2021-02-25 DIAGNOSIS — Z789 Other specified health status: Secondary | ICD-10-CM

## 2021-02-25 DIAGNOSIS — Z1231 Encounter for screening mammogram for malignant neoplasm of breast: Secondary | ICD-10-CM

## 2021-02-25 DIAGNOSIS — K589 Irritable bowel syndrome without diarrhea: Secondary | ICD-10-CM

## 2021-02-25 DIAGNOSIS — D125 Benign neoplasm of sigmoid colon: Secondary | ICD-10-CM

## 2021-02-25 DIAGNOSIS — K5900 Constipation, unspecified: Secondary | ICD-10-CM

## 2021-02-25 DIAGNOSIS — Z0000 Encounter for general adult medical examination without abnormal findings: Secondary | ICD-10-CM

## 2021-02-25 DIAGNOSIS — J45909 Unspecified asthma, uncomplicated: Secondary | ICD-10-CM

## 2021-02-25 DIAGNOSIS — K219 Gastro-esophageal reflux disease without esophagitis: Secondary | ICD-10-CM

## 2021-02-25 LAB — COMPREHENSIVE METABOLIC PANEL
ALBUMIN: 4.5 g/dL (ref 3.5–5.0)
ALK PHOSPHATASE: 79 U/L (ref 25–110)
ALT: 19 U/L (ref 7–56)
ANION GAP: 10 K/UL (ref 3–12)
AST: 16 U/L (ref 7–40)
CO2: 24 MMOL/L (ref 21–30)
EGFR: 34 mL/min — ABNORMAL LOW (ref >60)
SODIUM: 141 MMOL/L (ref 137–147)

## 2021-02-25 LAB — CBC AND DIFF
ABSOLUTE BASO COUNT: 0 K/UL (ref 0–0.20)
ABSOLUTE EOS COUNT: 0.2 K/UL (ref 0–0.45)
ABSOLUTE MONO COUNT: 0.5 K/UL (ref 0–0.80)
WBC COUNT: 7.7 K/UL (ref 4.5–11.0)

## 2021-02-25 LAB — IRON + BINDING CAPACITY + %SAT+ FERRITIN
% SATURATION: 22 % — ABNORMAL LOW (ref 28–42)
IRON BINDING: 396 ug/dL — ABNORMAL HIGH (ref <30)
IRON: 87 ug/dL — ABNORMAL HIGH (ref 50–160)

## 2021-02-25 LAB — VITAMIN B12: VITAMIN B12: 535 pg/mL (ref 180–914)

## 2021-02-25 LAB — MICROALB/CR RATIO-URINE RANDOM: MICROALBUMIN, RAN: 8.6 ug/mL — ABNORMAL HIGH (ref <100)

## 2021-02-25 LAB — THYROID STIMULATING HORMONE-TSH: TSH: 2 uU/mL — ABNORMAL LOW (ref <150)

## 2021-02-25 LAB — LIPID PROFILE: CHOLESTEROL: 236 mg/dL — ABNORMAL HIGH (ref <200)

## 2021-02-25 LAB — HEMOGLOBIN A1C: HEMOGLOBIN A1C: 7.7 % — ABNORMAL HIGH (ref >40)

## 2021-02-25 NOTE — Progress Notes (Signed)
LISSA, ROWLES ([id number]) Visit Report for 02/24/2021 Arrival Information Details Patient Name: Date of Service: [name], [name] 02/24/2021 3:45 PM Medical Record Number: [id number] Patient Account Number: [id number] Date of Birth/Sex: Treating RN: 1956/10/07 (65 y.o. [name] Primary Care Emmerson Taddei: [name] Other Clinician: Referring Chelbi Herber: Treating Tyshan Enderle/Extender: Tharon Aquas in Treatment: 6 Visit Information History Since Last Visit Added or deleted any medications: No Patient Arrived: Other Any new allergies or adverse reactions: No Arrival Time: 15:55 Had a fall or experienced change in No Accompanied By: self activities of daily living that may affect Transfer Assistance: None risk of falls: Patient Identification Verified: Yes Signs or symptoms of abuse/neglect since last visito No Secondary Verification Process Completed: Yes Hospitalized since last visit: No Patient Requires Transmission-Based Precautions: No Implantable device outside of the clinic excluding No Patient Has Alerts: No cellular tissue based products placed in the center since last visit: Has Dressing in Place as Prescribed: Yes Pain Present Now: Yes Electronic Signature(s) Signed: 02/25/2021 4:35:52 PM By: [name] Entered By: [name] on 02/24/2021 15:55:37 -------------------------------------------------------------------------------- Encounter Discharge Information Details Patient Name: Date of Service: [name], [name] 02/24/2021 3:45 PM Medical Record Number: [id number] Patient Account Number: [id number] Date of Birth/Sex: Treating RN: [birth year] (65 y.o. Sue Lush Primary Care Darrel Gloss: [name] Other Clinician: Referring Marlei Glomski: Treating Talin Feister/Extender: Tharon Aquas in Treatment: 6 Encounter Discharge Information Items Post Procedure Vitals Discharge  Condition: Stable Temperature (F): 99.1 Ambulatory Status: Wheelchair Pulse (bpm): 87 Discharge Destination: Home Respiratory Rate (breaths/min): 18 Transportation: Private Auto Blood Pressure (mmHg): 148/79 Schedule Follow-up Appointment: Yes Clinical Summary of Care: Provided on 02/24/2021 Form Type Recipient Paper Patient Patient Electronic Signature(s) Signed: 02/24/2021 4:57:53 PM By: Lorrin Jackson Entered By: Lorrin Jackson on 02/24/2021 16:57:53 -------------------------------------------------------------------------------- Lower Extremity Assessment Details Patient Name: Date of Service: [place], [name] 02/24/2021 3:45 PM Medical Record Number: [id number] Patient Account Number: [id number] Date of Birth/Sex: Treating RN: [birth year] (65 y.o. Sue Lush Primary Care Wrigley Winborne: [name] Other Clinician: Referring Jasneet Schobert: Treating Courtnie Brenes/Extender: Tharon Aquas in Treatment: 6 Edema Assessment Assessed: Shirlyn Goltz: Yes] Patrice Paradise: Yes] Edema: [Left: Yes] [Right: Yes] Calf Left: Right: Point of Measurement: 35 cm From Medial Instep 35 cm 35 cm Ankle Left: Right: Point of Measurement: 8 cm From Medial Instep 23 cm 22.5 cm Vascular Assessment Pulses: Dorsalis Pedis Palpable: [Left:Yes] [Right:Yes] Electronic Signature(s) Signed: 02/24/2021 5:13:18 PM By: Lorrin Jackson Entered By: Lorrin Jackson on 02/24/2021 16:10:50 -------------------------------------------------------------------------------- Multi Wound Chart Details Patient Name: Date of Service: [name], [name] 02/24/2021 3:45 PM Medical Record Number: [id number] Patient Account Number: [id number] Date of Birth/Sex: Treating RN: Aug 03, 1956 (65 y.o. [name], [name] Primary Care Dinari Stgermaine: [name] Other Clinician: Referring Kayceon Oki: Treating Latrisha Coiro/Extender: Tharon Aquas in Treatment: 6 Vital Signs Height(in):  61 Capillary Blood Glucose(mg/dl): 114 Weight(lbs): 162 Pulse(bpm): 12 Body Mass Index(BMI): 31 Blood Pressure(mmHg): 148/79 Temperature(F): 99.1 Respiratory Rate(breaths/min): 18 Photos: [10:Right, Lateral Foot] [9:Left Calcaneus] [N/A:N/A N/A] Wound Location: [10:Gradually Appeared] [9:Gradually Appeared] [N/A:N/A] Wounding Event: [10:Diabetic Wound/Ulcer of the Lower] [9:Diabetic Wound/Ulcer of the Lower] [N/A:N/A] Primary Etiology: [10:Extremity Cataracts, Anemia, Sleep Apnea,] [9:Extremity Cataracts, Anemia, Sleep Apnea,] [N/A:N/A] Comorbid History: [10:Coronary Artery Disease, Hypertension, Myocardial Infarction, Hypertension, Myocardial Infarction, Type II Diabetes, Osteoarthritis, Osteomyelitis, Neuropathy 09/29/2020] [9:Coronary Artery Disease, Type II Diabetes, Osteoarthritis,  Osteomyelitis, Neuropathy 01/11/2019] [N/A:N/A] Date Acquired: [10:6] [9:6] [N/A:N/A] Weeks of Treatment: [10:Open] [9:Open] [  N/A:N/A] Wound Status: [10:2.9x2.6x0.2] [9:4.5x3.8x0.8] [N/A:N/A] Measurements L x W x D (cm) [10:5.922] [9:13.43] [N/A:N/A] A (cm) : rea [10:1.184] [9:10.744] [N/A:N/A] Volume (cm) : [10:44.10%] [9:-15.50%] [N/A:N/A] % Reduction in A [10:rea: 92.00%] [9:58.00%] [N/A:N/A] % Reduction in Volume: [10:Grade 2] [9:Grade 2] [N/A:N/A] Classification: [10:Medium] [9:Medium] [N/A:N/A] Exudate A mount: [10:Serosanguineous] [9:Serosanguineous] [N/A:N/A] Exudate Type: [10:red, brown] [9:red, brown] [N/A:N/A] Exudate Color: [10:Distinct, outline attached] [9:Distinct, outline attached] [N/A:N/A] Wound Margin: [10:Large (67-100%)] [9:Large (67-100%)] [N/A:N/A] Granulation A mount: [10:Red, Pink] [9:Red, Pink, Pale] [N/A:N/A] Granulation Quality: [10:Small (1-33%)] [9:Small (1-33%)] [N/A:N/A] Necrotic A mount: [10:Fat Layer (Subcutaneous Tissue): Yes Fat Layer (Subcutaneous Tissue): Yes N/A] Exposed Structures: [10:Fascia: No Tendon: No Muscle: No Joint: No Bone: No Small (1-33%)]  [9:Fascia: No Tendon: No Muscle: No Joint: No Bone: No Small (1-33%)] [N/A:N/A] Epithelialization: [10:Debridement - Excisional] [9:Debridement - Excisional] [N/A:N/A] Debridement: Pre-procedure Verification/Time Out 16:28 [9:16:28] [N/A:N/A] Taken: [10:Lidocaine 4% Topical Solution] [9:Lidocaine 4% Topical Solution] [N/A:N/A] Pain Control: [10:Callus, Subcutaneous, Slough] [9:Callus, Subcutaneous, Slough] [N/A:N/A] Tissue Debrided: [10:Skin/Subcutaneous Tissue] [9:Skin/Subcutaneous Tissue] [N/A:N/A] Level: [10:9] [9:19.2] [N/A:N/A] Debridement A (sq cm): [10:rea Curette] [9:Curette] [N/A:N/A] Instrument: [10:Minimum] [9:Minimum] [N/A:N/A] Bleeding: [10:Pressure] [9:Pressure] [N/A:N/A] Hemostasis A chieved: [10:0] [9:0] [N/A:N/A] Procedural Pain: [10:0] [9:0] [N/A:N/A] Post Procedural Pain: [10:Procedure was tolerated well] [9:Procedure was tolerated well] [N/A:N/A] Debridement Treatment Response: [10:2.9x2.6x0.2] [9:4.5x3.8x0.8] [N/A:N/A] Post Debridement Measurements L x W x D (cm) [10:1.184] [9:10.744] [N/A:N/A] Post Debridement Volume: (cm) [10:Calloused Periwound] [9:Calloused Periwound] [N/A:N/A] Assessment Notes: [10:Debridement] [9:Debridement] [N/A:N/A] Treatment Notes Wound #10 (Foot) Wound Laterality: Right, Lateral Cleanser Soap and Water Discharge Instruction: May shower and wash wound with dial antibacterial soap and water prior to dressing change. Peri-Wound Care Topical Primary Dressing Hydrofera Blue Classic Foam, 4x4 in Discharge Instruction: Moisten with saline prior to applying to wound bed Secondary Dressing Woven Gauze Sponge, Non-Sterile 4x4 in Discharge Instruction: Apply over primary dressing as directed. ABD Pad, 5x9 Discharge Instruction: Apply over primary dressing as directed. Secured With [employer], 4.5x3.1 (in/yd) Discharge Instruction: Secure with Kerlix as directed. Transpore Surgical Tape, 2x10 (in/yd) Discharge Instruction: Secure  dressing with tape as directed. Compression Wrap Compression Stockings Add-Ons Wound #9 (Calcaneus) Wound Laterality: Left Cleanser Soap and Water Discharge Instruction: May shower and wash wound with dial antibacterial soap and water prior to dressing change. Peri-Wound Care Topical Primary Dressing Hydrofera Blue Classic Foam, 4x4 in Discharge Instruction: Moisten with saline prior to applying to wound bed Secondary Dressing Woven Gauze Sponge, Non-Sterile 4x4 in Discharge Instruction: Apply over primary dressing as directed. ABD Pad, 5x9 Discharge Instruction: Apply over primary dressing as directed. Secured With [employer], 4.5x3.1 (in/yd) Discharge Instruction: Secure with Kerlix as directed. Transpore Surgical Tape, 2x10 (in/yd) Discharge Instruction: Secure dressing with tape as directed. Compression Wrap Compression Stockings Add-Ons Electronic Signature(s) Signed: 02/24/2021 5:24:19 PM By: Kalman Shan DO Signed: 02/24/2021 6:31:57 PM By: Deon Pilling Entered By: Kalman Shan on 02/24/2021 17:14:36 -------------------------------------------------------------------------------- Multi-Disciplinary Care Plan Details Patient Name: Date of Service: [name], [name] 02/24/2021 3:45 PM Medical Record Number: [id number] Patient Account Number: [id number] Date of Birth/Sex: Treating RN: 1956/05/29 (65 y.o. [name] Primary Care Rufino Staup: [name] Other Clinician: Referring Adrienne Trombetta: Treating Allannah Kempen/Extender: Tharon Aquas in Treatment: 6 Active Inactive Pressure Nursing Diagnoses: Knowledge deficit related to management of pressures ulcers Goals: Patient/caregiver will verbalize understanding of pressure ulcer management Date Initiated: 01/10/2021 Target Resolution Date: 03/25/2021 Goal Status: Active Interventions: Assess offloading mechanisms upon admission and as needed Assess potential for  pressure  ulcer upon admission and as needed Provide education on pressure ulcers Notes: Wound/Skin Impairment Nursing Diagnoses: Impaired tissue integrity Goals: Patient/caregiver will verbalize understanding of skin care regimen Date Initiated: 01/10/2021 Target Resolution Date: 03/24/2021 Goal Status: Active Ulcer/skin breakdown will have a volume reduction of 30% by week 4 Date Initiated: 01/10/2021 Date Inactivated: 02/24/2021 Target Resolution Date: 02/14/2021 Unmet Reason: no change. see wound Goal Status: Unmet measurement. Interventions: Assess patient/caregiver ability to obtain necessary supplies Assess patient/caregiver ability to perform ulcer/skin care regimen upon admission and as needed Assess ulceration(s) every visit Provide education on ulcer and skin care Treatment Activities: Skin care regimen initiated : 01/10/2021 Topical wound management initiated : 01/10/2021 Notes: Electronic Signature(s) Signed: 02/24/2021 6:31:57 PM By: Deon Pilling Entered By: Deon Pilling on 02/24/2021 16:33:57 -------------------------------------------------------------------------------- Pain Assessment Details Patient Name: Date of Service: [name], [name] 02/24/2021 3:45 PM Medical Record Number: [id number] Patient Account Number: [id number] Date of Birth/Sex: Treating RN: 1955-11-17 (65 y.o. [name] Primary Care Oluwasemilore Pascuzzi: [name] Other Clinician: Referring Jovanne Riggenbach: Treating Rett Stehlik/Extender: Tharon Aquas in Treatment: 6 Active Problems Location of Pain Severity and Description of Pain Patient Has Paino Yes Site Locations Rate the pain. Current Pain Level: 6 Pain Management and Medication Current Pain Management: Electronic Signature(s) Signed: 02/24/2021 6:31:57 PM By: Deon Pilling Signed: 02/25/2021 4:35:52 PM By: [name] Entered By: [name] on 02/24/2021  15:56:09 -------------------------------------------------------------------------------- Patient/Caregiver Education Details Patient Name: Date of Service: [name], [name] 4/28/2022andnbsp3:45 PM Medical Record Number: [id number] Patient Account Number: [id number] Date of Birth/Gender: Treating RN: [birth year] (65 y.o. [name] Primary Care Physician: [name] Other Clinician: Referring Physician: Treating Physician/Extender: Tharon Aquas in Treatment: 6 Education Assessment Education Provided To: Patient Education Topics Provided Wound/Skin Impairment: Handouts: Skin Care Do's and Dont's Methods: Explain/Verbal Responses: Reinforcements needed Electronic Signature(s) Signed: 02/24/2021 6:31:57 PM By: Deon Pilling Entered By: Deon Pilling on 02/24/2021 16:34:10 -------------------------------------------------------------------------------- Wound Assessment Details Patient Name: Date of Service: [name], [name] 02/24/2021 3:45 PM Medical Record Number: [id number] Patient Account Number: [id number] Date of Birth/Sex: Treating RN: Sep 22, 1956 (65 y.o. [name], [name] Primary Care Joaquim Tolen: [name] Other Clinician: Referring Appollonia Klee: Treating Earnstine Meinders/Extender: Tharon Aquas in Treatment: 6 Wound Status Wound Number: 10 Primary Diabetic Wound/Ulcer of the Lower Extremity Etiology: Wound Location: Right, Lateral Foot Wound Open Wounding Event: Gradually Appeared Status: Date Acquired: 09/29/2020 Comorbid Cataracts, Anemia, Sleep Apnea, Coronary Artery Disease, Weeks Of Treatment: 6 History: Hypertension, Myocardial Infarction, Type II Diabetes, Clustered Wound: No Osteoarthritis, Osteomyelitis, Neuropathy Photos Wound Measurements Length: (cm) 2.9 Width: (cm) 2.6 Depth: (cm) 0.2 Area: (cm) 5.922 Volume: (cm) 1.184 % Reduction in Area: 44.1% % Reduction in Volume:  92% Epithelialization: Small (1-33%) Tunneling: No Undermining: No Wound Description Classification: Grade 2 Wound Margin: Distinct, outline attached Exudate Amount: Medium Exudate Type: Serosanguineous Exudate Color: red, brown Foul Odor After Cleansing: No Slough/Fibrino Yes Wound Bed Granulation Amount: Large (67-100%) Exposed Structure Granulation Quality: Red, Pink Fascia Exposed: No Necrotic Amount: Small (1-33%) Fat Layer (Subcutaneous Tissue) Exposed: Yes Necrotic Quality: Adherent Slough Tendon Exposed: No Muscle Exposed: No Joint Exposed: No Bone Exposed: No Assessment Notes Calloused Periwound Treatment Notes Wound #10 (Foot) Wound Laterality: Right, Lateral Cleanser Soap and Water Discharge Instruction: May shower and wash wound with dial antibacterial soap and water prior to dressing change. Peri-Wound Care Topical Primary Dressing Hydrofera Blue Classic Foam, 4x4 in Discharge Instruction: Moisten with saline prior  to applying to wound bed Secondary Dressing Woven Gauze Sponge, Non-Sterile 4x4 in Discharge Instruction: Apply over primary dressing as directed. ABD Pad, 5x9 Discharge Instruction: Apply over primary dressing as directed. Secured With [employer], 4.5x3.1 (in/yd) Discharge Instruction: Secure with Kerlix as directed. Transpore Surgical Tape, 2x10 (in/yd) Discharge Instruction: Secure dressing with tape as directed. Compression Wrap Compression Stockings Add-Ons Electronic Signature(s) Signed: 02/24/2021 6:31:57 PM By: Deon Pilling Signed: 02/25/2021 4:35:52 PM By: [name] Entered By: [name] on 02/24/2021 16:34:49 -------------------------------------------------------------------------------- Wound Assessment Details Patient Name: Date of Service: [name], [name] 02/24/2021 3:45 PM Medical Record Number: [id number] Patient Account Number: [id number] Date of Birth/Sex: Treating RN: Mar 20, 1956 (65 y.o. [name], [name] Primary Care Kriste Broman: [name] Other Clinician: Referring Javiel Canepa: Treating Lorine Iannaccone/Extender: Tharon Aquas in Treatment: 6 Wound Status Wound Number: 9 Primary Diabetic Wound/Ulcer of the Lower Extremity Etiology: Wound Location: Left Calcaneus Wound Open Wounding Event: Gradually Appeared Status: Date Acquired: 01/11/2019 Comorbid Cataracts, Anemia, Sleep Apnea, Coronary Artery Disease, Weeks Of Treatment: 6 History: Hypertension, Myocardial Infarction, Type II Diabetes, Clustered Wound: No Osteoarthritis, Osteomyelitis, Neuropathy Photos Wound Measurements Length: (cm) 4.5 Width: (cm) 3.8 Depth: (cm) 0.8 Area: (cm) 13.43 Volume: (cm) 10.744 % Reduction in Area: -15.5% % Reduction in Volume: 58% Epithelialization: Small (1-33%) Tunneling: No Undermining: No Wound Description Classification: Grade 2 Wound Margin: Distinct, outline attached Exudate Amount: Medium Exudate Type: Serosanguineous Exudate Color: red, brown Foul Odor After Cleansing: No Slough/Fibrino Yes Wound Bed Granulation Amount: Large (67-100%) Exposed Structure Granulation Quality: Red, Pink, Pale Fascia Exposed: No Necrotic Amount: Small (1-33%) Fat Layer (Subcutaneous Tissue) Exposed: Yes Necrotic Quality: Adherent Slough Tendon Exposed: No Muscle Exposed: No Joint Exposed: No Bone Exposed: No Assessment Notes Calloused Periwound Treatment Notes Wound #9 (Calcaneus) Wound Laterality: Left Cleanser Soap and Water Discharge Instruction: May shower and wash wound with dial antibacterial soap and water prior to dressing change. Peri-Wound Care Topical Primary Dressing Hydrofera Blue Classic Foam, 4x4 in Discharge Instruction: Moisten with saline prior to applying to wound bed Secondary Dressing Woven Gauze Sponge, Non-Sterile 4x4 in Discharge Instruction: Apply over primary dressing as directed. ABD Pad, 5x9 Discharge  Instruction: Apply over primary dressing as directed. Secured With [employer], 4.5x3.1 (in/yd) Discharge Instruction: Secure with Kerlix as directed. Transpore Surgical Tape, 2x10 (in/yd) Discharge Instruction: Secure dressing with tape as directed. Compression Wrap Compression Stockings Add-Ons Electronic Signature(s) Signed: 02/24/2021 6:31:57 PM By: Deon Pilling Signed: 02/25/2021 4:35:52 PM By: [name] Entered By: [name] on 02/24/2021 16:34:29 -------------------------------------------------------------------------------- Vitals Details Patient Name: Date of Service: [name], [name] 02/24/2021 3:45 PM Medical Record Number: [id number] Patient Account Number: [id number] Date of Birth/Sex: Treating RN: 1956-03-10 (65 y.o. [name], Tammi Klippel Primary Care Royale Swamy: [name] Other Clinician: Referring Caidence Higashi: Treating Melessia Kaus/Extender: Tharon Aquas in Treatment: 6 Vital Signs Time Taken: 15:55 Temperature (F): 99.1 Height (in): 61 Pulse (bpm): 87 Weight (lbs): 162 Respiratory Rate (breaths/min): 18 Body Mass Index (BMI): 30.6 Blood Pressure (mmHg): 148/79 Capillary Blood Glucose (mg/dl): 114 Reference Range: 80 - 120 mg / dl Electronic Signature(s) Signed: 02/25/2021 4:35:52 PM By: [name] Entered By: [name] on 02/24/2021 15:55:59
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
## 2021-02-26 DIAGNOSIS — D509 Iron deficiency anemia, unspecified: Secondary | ICD-10-CM

## 2021-02-26 DIAGNOSIS — E2839 Other primary ovarian failure: Secondary | ICD-10-CM

## 2021-02-26 DIAGNOSIS — E785 Hyperlipidemia, unspecified: Secondary | ICD-10-CM

## 2021-02-26 DIAGNOSIS — R809 Proteinuria, unspecified: Secondary | ICD-10-CM

## 2021-02-26 DIAGNOSIS — E559 Vitamin D deficiency, unspecified: Secondary | ICD-10-CM

## 2021-02-26 DIAGNOSIS — E538 Deficiency of other specified B group vitamins: Secondary | ICD-10-CM

## 2021-02-26 DIAGNOSIS — E1129 Type 2 diabetes mellitus with other diabetic kidney complication: Secondary | ICD-10-CM

## 2021-02-26 DIAGNOSIS — R5383 Other fatigue: Secondary | ICD-10-CM

## 2021-02-27 ENCOUNTER — Encounter: Admit: 2021-02-27 | Discharge: 2021-02-27 | Payer: MEDICARE

## 2021-03-04 ENCOUNTER — Encounter (HOSPITAL_BASED_OUTPATIENT_CLINIC_OR_DEPARTMENT_OTHER): Payer: PRIVATE HEALTH INSURANCE | Attending: Internal Medicine | Admitting: Internal Medicine

## 2021-03-11 ENCOUNTER — Other Ambulatory Visit: Payer: Self-pay | Admitting: Family Medicine

## 2021-03-11 ENCOUNTER — Encounter: Admit: 2021-03-11 | Discharge: 2021-03-11 | Payer: MEDICARE

## 2021-03-11 DIAGNOSIS — M8610 Other acute osteomyelitis, unspecified site: Secondary | ICD-10-CM

## 2021-03-11 DIAGNOSIS — Z981 Arthrodesis status: Secondary | ICD-10-CM

## 2021-03-11 DIAGNOSIS — G894 Chronic pain syndrome: Secondary | ICD-10-CM

## 2021-03-11 DIAGNOSIS — Z1231 Encounter for screening mammogram for malignant neoplasm of breast: Secondary | ICD-10-CM

## 2021-03-11 MED ORDER — HYDROCODONE-ACETAMINOPHEN 5-325 MG PO TABS: 1.0000 | ORAL_TABLET | Freq: Four times a day (QID) | ORAL | 0 refills | Status: DC | PRN

## 2021-03-11 NOTE — Telephone Encounter (Signed)
RequestingLebron [name] Contract: 2019 UDS: 2019 Last OV: 02/01/2021 Next OV: N/A Last Refill:  02/10/2021,#120--0 RF Database:   Please advise

## 2021-03-11 NOTE — Progress Notes
Results noted; benign.

## 2021-03-11 NOTE — Telephone Encounter (Signed)
Medication: HYDROcodone-acetaminophen (NORCO/VICODIN) 5-325 MG tablet [[id number]]       Has the patient contacted their pharmacy? No  (If no, request that the patient contact the pharmacy for the refill.) (If yes, when and what did the pharmacy advise?)   narco  Preferred Pharmacy (with phone number or street name):  [facility] [address] [place], [place] - [address]  [address], [place] [address]  Phone:  [phone] Fax:  [phone]     Agent: Please be advised that RX refills may take up to 3 business days. We ask that you follow-up with your pharmacy.

## 2021-03-13 ENCOUNTER — Encounter: Admit: 2021-03-13 | Discharge: 2021-03-13 | Payer: MEDICARE

## 2021-03-14 ENCOUNTER — Encounter: Admit: 2021-03-14 | Discharge: 2021-03-14 | Payer: MEDICARE

## 2021-03-14 DIAGNOSIS — E2839 Other primary ovarian failure: Secondary | ICD-10-CM

## 2021-03-14 NOTE — Progress Notes
Letter to pt re: results:  bone density normal - repeat in 5 years.

## 2021-03-19 ENCOUNTER — Other Ambulatory Visit: Payer: Self-pay | Admitting: Family Medicine

## 2021-03-19 DIAGNOSIS — G894 Chronic pain syndrome: Secondary | ICD-10-CM

## 2021-03-19 DIAGNOSIS — Z981 Arthrodesis status: Secondary | ICD-10-CM

## 2021-03-24 ENCOUNTER — Encounter: Payer: Self-pay | Admitting: Family Medicine

## 2021-03-24 ENCOUNTER — Other Ambulatory Visit: Payer: Self-pay

## 2021-03-24 ENCOUNTER — Ambulatory Visit (INDEPENDENT_AMBULATORY_CARE_PROVIDER_SITE_OTHER): Payer: PRIVATE HEALTH INSURANCE | Admitting: Family Medicine

## 2021-03-24 VITALS — BP 160/80 | HR 72 | Temp 98.6°F | Resp 20 | Ht 65.0 in

## 2021-03-24 DIAGNOSIS — R232 Flushing: Secondary | ICD-10-CM | POA: Diagnosis not present

## 2021-03-24 DIAGNOSIS — T7840XD Allergy, unspecified, subsequent encounter: Secondary | ICD-10-CM | POA: Diagnosis not present

## 2021-03-24 DIAGNOSIS — M1990 Unspecified osteoarthritis, unspecified site: Secondary | ICD-10-CM | POA: Diagnosis not present

## 2021-03-24 DIAGNOSIS — M2550 Pain in unspecified joint: Secondary | ICD-10-CM | POA: Diagnosis not present

## 2021-03-24 MED ORDER — AZELASTINE HCL 0.05 % OP SOLN: 1.0000 [drp] | Freq: Two times a day (BID) | OPHTHALMIC | 12 refills | Status: DC

## 2021-03-24 MED ORDER — MONTELUKAST SODIUM 10 MG PO TABS: 10.0000 mg | ORAL_TABLET | Freq: Every day | ORAL | 3 refills | Status: DC
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
## 2021-03-24 NOTE — Patient Instructions (Signed)
Allergies, Adult An allergy is a condition in which the body's defense system (immune system) comes in contact with an allergen and reacts to it. An allergen is anything that causes an allergic reaction. Allergens cause the immune system to make proteins for fighting infections (antibodies). These antibodies cause cells to release chemicals called histamines that set off the symptoms of an allergic reaction. Allergies often affect the nasal passages (allergic rhinitis), eyes (allergic conjunctivitis), skin (atopic dermatitis), and stomach. Allergies can be mild, moderate, or severe. They cannot spread from person to person. Allergies can develop at any age and may be outgrown. What are the causes? This condition is caused by allergens. Common allergens include:  Outdoor allergens, such as pollen, car fumes, and mold.  Indoor allergens, such as dust, smoke, mold, and pet dander.  Other allergens, such as foods, medicines, scents, insect bites or stings, and other skin irritants. What increases the risk? You are more likely to develop this condition if you have:  Family members with allergies.  Family members who have any condition that may be caused by allergens, such as asthma. This may make you more likely to have other allergies. What are the signs or symptoms? Symptoms of this condition depend on the severity of the allergy. Mild to moderate symptoms  Runny nose, stuffy nose (nasal congestion), or sneezing.  Itchy mouth, ears, or throat.  A feeling of mucus dripping down the back of your throat (postnasal drip).  Sore throat.  Itchy, red, watery, or puffy eyes.  Skin rash, or itchy, red, swollen areas of skin (hives).  Stomach cramps or bloating. Severe symptoms Severe allergies to food, medicine, or insect bites may cause anaphylaxis, which can be life-threatening. Symptoms include:  A red (flushed) face.  Wheezing or coughing.  Swollen lips, tongue, or mouth.  Tight or  swollen throat.  Chest pain or tightness, or rapid heartbeat.  Trouble breathing or shortness of breath.  Pain in the abdomen, vomiting, or diarrhea.  Dizziness or fainting. How is this diagnosed? This condition is diagnosed based on your symptoms, your family and medical history, and a physical exam. You may also have tests, including:  Skin tests to see how your skin reacts to allergens that may be causing your symptoms. Tests include: ? Skin prick test. For this test, an allergen is introduced to your body through a small opening in the skin. ? Intradermal skin test. For this test, a small amount of allergen is injected under the first layer of your skin. ? Patch test. For this test, a small amount of allergen is placed on your skin. The area is covered and then checked after a few days.  Blood tests.  A challenge test. For this test, you will eat or breathe in a small amount of allergen to see if you have an allergic reaction. You may also be asked to:  Keep a food diary. This is a record of all the foods, drinks, and symptoms you have in a day.  Try an elimination diet. To do this: ? Remove certain foods from your diet. ? Add those foods back one by one to find out if any foods cause an allergic reaction. How is this treated? Treatment for allergies depends on your symptoms. Treatment may include:  Cold, wet cloths (cold compresses) to soothe itching and swelling.  Eye drops or nasal sprays.  Nasal irrigation to help clear your mucus or keep the nasal passages moist.  A humidifier to add moisture to the  air.  Skin creams to treat rashes or itching.  Oral antihistamines or other medicines to block the reaction or to treat inflammation.  Diet changes to remove foods that cause allergies.  Being exposed again and again to tiny amounts of allergens to help you build a defense against it (tolerance). This is called immunotherapy. Examples include: ? Allergy shot. You  receive an injection that contains an allergen. ? Sublingual immunotherapy. You take a small dose of allergen under your tongue.  Emergency injection for anaphylaxis. You give yourself a shot using a syringe (auto-injector) that contains the amount of medicine you need. Your health care provider will teach you how to give yourself an injection.      Follow these instructions at home: Medicines  Take or apply over-the-counter and prescription medicines only as told by your health care provider.  Always carry your auto-injector pen if you are at risk of anaphylaxis. Give yourself an injection as told by your health care provider.   Eating and drinking  Follow instructions from your health care provider about eating or drinking restrictions.  Drink enough fluid to keep your urine pale yellow. General instructions  Wear a medical alert bracelet or necklace to let others know that you have had anaphylaxis before.  Avoid known allergens whenever possible.  Keep all follow-up visits as told by your health care provider. This is important. Contact a health care provider if:  Your symptoms do not get better with treatment. Get help right away if:  You have symptoms of anaphylaxis. These include: ? Swollen mouth, tongue, or throat. ? Pain or tightness in your chest. ? Trouble breathing or shortness of breath. ? Dizziness or fainting. ? Severe abdominal pain, vomiting, or diarrhea. These symptoms may represent a serious problem that is an emergency. Do not wait to see if the symptoms will go away. Get medical help right away. Call your local emergency services (911 in the [place] Do not drive yourself to the hospital. Summary  Take or apply over-the-counter and prescription medicines only as told by your health care provider.  Avoid known allergens when possible.  Always carry your auto-injector pen if you are at risk of anaphylaxis. Give yourself an injection as told by your health  care provider.  Wear a medical alert bracelet or necklace to let others know that you have had anaphylaxis before.  Anaphylaxis is a life-threatening emergency. Get help right away. This information is not intended to replace advice given to you by your health care provider. Make sure you discuss any questions you have with your health care provider. Document Revised: 06/14/2020 Document Reviewed: 08/27/2019 Elsevier Patient Education  2021 [employer].

## 2021-03-25 ENCOUNTER — Encounter: Payer: Self-pay | Admitting: Family

## 2021-03-25 ENCOUNTER — Inpatient Hospital Stay: Payer: PRIVATE HEALTH INSURANCE | Attending: Hematology & Oncology

## 2021-03-25 ENCOUNTER — Other Ambulatory Visit: Payer: Self-pay | Admitting: Family Medicine

## 2021-03-25 ENCOUNTER — Inpatient Hospital Stay: Payer: PRIVATE HEALTH INSURANCE | Admitting: Family

## 2021-03-25 VITALS — BP 160/66 | HR 71 | Temp 98.0°F | Resp 18

## 2021-03-25 DIAGNOSIS — Z7901 Long term (current) use of anticoagulants: Secondary | ICD-10-CM | POA: Diagnosis not present

## 2021-03-25 DIAGNOSIS — E7211 Homocystinuria: Secondary | ICD-10-CM | POA: Diagnosis not present

## 2021-03-25 DIAGNOSIS — D6859 Other primary thrombophilia: Secondary | ICD-10-CM

## 2021-03-25 DIAGNOSIS — D500 Iron deficiency anemia secondary to blood loss (chronic): Secondary | ICD-10-CM | POA: Diagnosis not present

## 2021-03-25 DIAGNOSIS — Z79899 Other long term (current) drug therapy: Secondary | ICD-10-CM | POA: Insufficient documentation

## 2021-03-25 DIAGNOSIS — I82503 Chronic embolism and thrombosis of unspecified deep veins of lower extremity, bilateral: Secondary | ICD-10-CM

## 2021-03-25 DIAGNOSIS — D509 Iron deficiency anemia, unspecified: Secondary | ICD-10-CM | POA: Diagnosis not present

## 2021-03-25 DIAGNOSIS — Z86718 Personal history of other venous thrombosis and embolism: Secondary | ICD-10-CM | POA: Diagnosis not present

## 2021-03-25 DIAGNOSIS — D508 Other iron deficiency anemias: Secondary | ICD-10-CM

## 2021-03-25 DIAGNOSIS — G894 Chronic pain syndrome: Secondary | ICD-10-CM

## 2021-03-25 LAB — COMPREHENSIVE METABOLIC PANEL
ALT: 8 U/L (ref 0–35)
AST: 12 U/L (ref 0–37)
Albumin: 3.7 g/dL (ref 3.5–5.2)
Alkaline Phosphatase: 108 U/L (ref 39–117)
BUN: 44 mg/dL — ABNORMAL HIGH (ref 6–23)
CO2: 22 mEq/L (ref 19–32)
Calcium: 9 mg/dL (ref 8.4–10.5)
Chloride: 107 mEq/L (ref 96–112)
Creatinine, Ser: 2.95 mg/dL — ABNORMAL HIGH (ref 0.40–1.20)
GFR: 16.18 mL/min — ABNORMAL LOW (ref >60.00)
Glucose, Bld: 113 mg/dL — ABNORMAL HIGH (ref 70–99)
Potassium: 5.4 mEq/L — ABNORMAL HIGH (ref 3.5–5.1)
Sodium: 137 mEq/L (ref 135–145)
Total Bilirubin: 0.4 mg/dL (ref 0.2–1.2)
Total Protein: 7.5 g/dL (ref 6.0–8.3)

## 2021-03-25 LAB — D-DIMER, QUANTITATIVE: D-Dimer, Quant: 1.42 ug/mL-FEU — ABNORMAL HIGH (ref 0.00–0.50)

## 2021-03-25 LAB — CBC WITH DIFFERENTIAL/PLATELET
Basophils Absolute: 0.1 10*3/uL (ref 0.0–0.1)
Basophils Relative: 1 % (ref 0.0–3.0)
Eosinophils Absolute: 0.1 10*3/uL (ref 0.0–0.7)
Eosinophils Relative: 2.2 % (ref 0.0–5.0)
HCT: 29.7 % — ABNORMAL LOW (ref 36.0–46.0)
Hemoglobin: 9.8 g/dL — ABNORMAL LOW (ref 12.0–15.0)
Lymphocytes Relative: 14.9 % (ref 12.0–46.0)
Lymphs Abs: 1 10*3/uL (ref 0.7–4.0)
MCHC: 32.9 g/dL (ref 30.0–36.0)
MCV: 87 fl (ref 78.0–100.0)
Monocytes Absolute: 0.6 10*3/uL (ref 0.1–1.0)
Monocytes Relative: 8.3 % (ref 3.0–12.0)
Neutro Abs: 5 10*3/uL (ref 1.4–7.7)
Neutrophils Relative %: 73.6 % (ref 43.0–77.0)
Platelets: 183 10*3/uL (ref 150.0–400.0)
RBC: 3.41 Mil/uL — ABNORMAL LOW (ref 3.87–5.11)
RDW: 15 % (ref 11.5–15.5)
WBC: 6.8 10*3/uL (ref 4.0–10.5)

## 2021-03-25 LAB — CBC WITH DIFFERENTIAL (CANCER CENTER ONLY)
Abs Immature Granulocytes: 0.01 10*3/uL (ref 0.00–0.07)
Basophils Absolute: 0.1 10*3/uL (ref 0.0–0.1)
Basophils Relative: 1 %
Eosinophils Absolute: 0.2 10*3/uL (ref 0.0–0.5)
Eosinophils Relative: 3 %
HCT: 29 % — ABNORMAL LOW (ref 36.0–46.0)
Hemoglobin: 9.2 g/dL — ABNORMAL LOW (ref 12.0–15.0)
Immature Granulocytes: 0 %
Lymphocytes Relative: 19 %
Lymphs Abs: 1.1 10*3/uL (ref 0.7–4.0)
MCH: 28.3 pg (ref 26.0–34.0)
MCHC: 31.7 g/dL (ref 30.0–36.0)
MCV: 89.2 fL (ref 80.0–100.0)
Monocytes Absolute: 0.5 10*3/uL (ref 0.1–1.0)
Monocytes Relative: 8 %
Neutro Abs: 4 10*3/uL (ref 1.7–7.7)
Neutrophils Relative %: 69 %
Platelet Count: 156 10*3/uL (ref 150–400)
RBC: 3.25 MIL/uL — ABNORMAL LOW (ref 3.87–5.11)
RDW: 14.7 % (ref 11.5–15.5)
WBC Count: 5.8 10*3/uL (ref 4.0–10.5)
nRBC: 0 % (ref 0.0–0.2)

## 2021-03-25 LAB — THYROID PANEL WITH TSH
Free Thyroxine Index: 1.7 (ref 1.4–3.8)
T3 Uptake: 33 % (ref 22–35)
T4, Total: 5.2 ug/dL (ref 5.1–11.9)
TSH: 6.9 mIU/L — ABNORMAL HIGH (ref 0.40–4.50)

## 2021-03-25 LAB — CMP (CANCER CENTER ONLY)
ALT: 8 U/L (ref 0–44)
AST: 13 U/L — ABNORMAL LOW (ref 15–41)
Albumin: 3.6 g/dL (ref 3.5–5.0)
Alkaline Phosphatase: 89 U/L (ref 38–126)
Anion gap: 6 (ref 5–15)
BUN: 44 mg/dL — ABNORMAL HIGH (ref 8–23)
CO2: 23 mmol/L (ref 22–32)
Calcium: 9.3 mg/dL (ref 8.9–10.3)
Chloride: 108 mmol/L (ref 98–111)
Creatinine: 2.95 mg/dL — ABNORMAL HIGH (ref 0.44–1.00)
GFR, Estimated: 17 mL/min — ABNORMAL LOW (ref >60)
Glucose, Bld: 151 mg/dL — ABNORMAL HIGH (ref 70–99)
Potassium: 5.5 mmol/L — ABNORMAL HIGH (ref 3.5–5.1)
Sodium: 137 mmol/L (ref 135–145)
Total Bilirubin: 0.4 mg/dL (ref 0.3–1.2)
Total Protein: 7.4 g/dL (ref 6.5–8.1)

## 2021-03-25 LAB — RETICULOCYTES
Immature Retic Fract: 9.7 % (ref 2.3–15.9)
RBC.: 3.26 MIL/uL — ABNORMAL LOW (ref 3.87–5.11)
Retic Count, Absolute: 81.5 10*3/uL (ref 19.0–186.0)
Retic Ct Pct: 2.5 % (ref 0.4–3.1)

## 2021-03-25 LAB — RHEUMATOID FACTOR: Rheumatoid fact SerPl-aCnc: <14 IU/mL (ref <14)

## 2021-03-25 NOTE — Progress Notes (Signed)
Hematology and Oncology Follow Up Visit  [name] [name] [id number] [birth year] 65 y.o. 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 She notes fatigue and feels that her iron may be low.  She has not noted any blood loss. No abnormal bruising, no petechiae.  Hgb 9.2, MCV 89, platelets 156 and WBC count 5.8.  She has occasional episodes of nausea.  No fever, chills, n/v, cough, rash, dizziness, SOB, chest pain, palpitations, abdominal pain or changes in bowel or bladder habits. The mild swelling in her lower extremities is unchanged.  No falls or syncope to report.  He appetite is good and she is doing her best to stay well hydrated. She is avoiding sodium.   ECOG Performance Status: 2 - Symptomatic, <50% confined to bed  Medications:  Allergies as of 03/25/2021      Reactions   Bee Pollen Anaphylaxis   Fish-derived Products Hives, Shortness Of Breath, Swelling, Rash   Hives get in throat causing trouble breathing   Mushroom Extract Complex Anaphylaxis   Penicillins Anaphylaxis   **Tolerated cefepime March 2021 Did it involve swelling of the face/tongue/throat, SOB, or low BP? Yes Did it involve sudden or severe rash/hives, skin peeling, or any reaction on the inside of your mouth or nose? No Did you need to seek medical attention at a hospital or doctor's office? Yes When did it last happen?A few months ago If all above answers are "NO", may proceed with cephalosporin use.   Rosemary Oil Anaphylaxis   Shellfish Allergy Hives, Shortness Of Breath, Swelling, Rash   Tomato Hives, Shortness Of Breath   Hives in throat causes her trouble breathing   Acetaminophen Other (See Comments)   GI upset   Aloe Vera Hives    Broccoli [brassica Oleracea] Hives   Acyclovir And Related Other (See Comments)   Unknown reaction   Naproxen Other (See Comments)   Unknown reaction      Medication List       Accurate as of Mar 25, 2021 11:59 PM. If you have any questions, ask your nurse or doctor.        acetaminophen 325 MG tablet Commonly known as: TYLENOL Take 2 tablets (650 mg total) by mouth every 6 (six) hours as needed for fever.   atorvastatin 10 MG tablet Commonly known as: LIPITOR Take 1 tablet by mouth once daily   azelastine 0.05 % ophthalmic solution Commonly known as: OPTIVAR Place 1 drop into both eyes 2 (two) times daily.   azelastine 0.1 % nasal spray Commonly known as: ASTELIN Place 1 spray into both nostrils 2 (two) times daily. Use in each nostril as directed   Ciclopirox 1 % shampoo Massage in scalp and let sit 3 min and rinse 2 x weekly   ciprofloxacin 500 MG tablet Commonly known as: CIPRO Take 500 mg by mouth daily.   diazepam 5 MG tablet Commonly known as: VALIUM Take 1 tablet (5 mg total) by mouth every 12 (twelve) hours as needed for anxiety.   DULoxetine 60 MG capsule Commonly known as: CYMBALTA Take 1 capsule by mouth once daily   fluticasone 50 MCG/ACT nasal spray Commonly known as: FLONASE Place 2 sprays into both nostrils daily.   folic acid  1 MG tablet Commonly known as: FOLVITE Take 2 tablets (2 mg total) by mouth daily.   FreeStyle Libre 14 Day Reader Kerrin Mo 1 Device by Does not apply route daily.   FreeStyle Libre 14 Day Sensor Misc 1 Device by Does not apply route daily.   furosemide 20 MG tablet Commonly known as: LASIX Take 20 mg by mouth daily.   HumaLOG KwikPen 200 UNIT/ML KwikPen Generic drug: insulin lispro 15 units sub-q twice daily   HYDROcodone-acetaminophen 5-325 MG tablet Commonly known as: NORCO/VICODIN Take 1 tablet by mouth every 6 (six) hours as needed for moderate pain.   Lantus SoloStar 100 UNIT/ML Solostar Pen Generic  drug: insulin glargine Injection 20 units in the morning and 40 units at night   lidocaine 5 % Commonly known as: Lidoderm Place 1 patch onto the skin daily. Remove & Discard patch within 12 hours or as directed by MD   montelukast 10 MG tablet Commonly known as: SINGULAIR Take 1 tablet (10 mg total) by mouth at bedtime.   multivitamin Chew chewable tablet Chew 1 tablet by mouth daily.   multivitamin with minerals Tabs tablet Take 1 tablet by mouth daily.   naftifine 1 % cream Commonly known as: NAFTIN Apply topically daily.   nystatin powder Generic drug: nystatin APPLY  POWDER TOPICALLY THREE TIMES DAILY   pregabalin 75 MG capsule Commonly known as: LYRICA TAKE 1 CAPSULE BY MOUTH THREE TIMES DAILY   Xarelto 2.5 MG Tabs tablet Generic drug: rivaroxaban Take by mouth.       Allergies:  Allergies  Allergen Reactions  . Bee Pollen Anaphylaxis  . Fish-Derived Products Hives, Shortness Of Breath, Swelling and Rash    Hives get in throat causing trouble breathing  . Mushroom Extract Complex Anaphylaxis  . Penicillins Anaphylaxis    **Tolerated cefepime March 2021 Did it involve swelling of the face/tongue/throat, SOB, or low BP? Yes Did it involve sudden or severe rash/hives, skin peeling, or any reaction on the inside of your mouth or nose? No Did you need to seek medical attention at a hospital or doctor's office? Yes When did it last happen?A few months ago If all above answers are "NO", may proceed with cephalosporin use.  [name] Kitchen Rosemary Oil Anaphylaxis  . Shellfish Allergy Hives, Shortness Of Breath, Swelling and Rash  . Tomato Hives and Shortness Of Breath    Hives in throat causes her trouble breathing  . Acetaminophen Other (See Comments)    GI upset  . Aloe Vera Hives  . Broccoli [Brassica Oleracea] Hives  . Acyclovir And Related Other (See Comments)    Unknown reaction  . Naproxen Other (See Comments)    Unknown reaction    Past Medical History,  Surgical history, Social history, and Family History were reviewed and updated.  Review of Systems: All other 10 point review of systems is negative.   Physical Exam:  oral temperature is 98 F (36.7 C). Her blood pressure is 160/66 (abnormal) and her pulse is 71. Her respiration is 18 and oxygen saturation is 100%.   Wt Readings from Last 3 Encounters:  01/25/21 165 lb (74.8 kg)  11/23/20 169 lb (76.7 kg)  11/23/20 169 lb (76.7 kg)    Ocular: Sclerae unicteric, pupils equal, round and reactive to light Ear-nose-throat: Oropharynx clear, dentition fair Lymphatic: No cervical or supraclavicular adenopathy Lungs no rales or rhonchi, good excursion bilaterally Heart regular rate and rhythm, no murmur appreciated Abd soft, nontender, positive bowel sounds MSK no focal spinal tenderness,  no joint edema Neuro: non-focal, well-oriented, appropriate affect Breasts: Deferred   Lab Results  Component Value Date   WBC 5.8 03/25/2021   HGB 9.2 (L) 03/25/2021   HCT 29.0 (L) 03/25/2021   MCV 89.2 03/25/2021   PLT 156 03/25/2021   Lab Results  Component Value Date   FERRITIN 532 (H) 01/25/2021   IRON 59 01/25/2021   TIBC 269 01/25/2021   UIBC 209 01/25/2021   IRONPCTSAT 22 01/25/2021   Lab Results  Component Value Date   RETICCTPCT 2.5 03/25/2021   RBC 3.26 (L) 03/25/2021   No results found for: KPAFRELGTCHN, LAMBDASER, KAPLAMBRATIO No results found for: IGGSERUM, IGA, IGMSERUM No results found for: Ronnald Ramp, A1GS, A2GS, Violet Baldy, MSPIKE, SPEI   Chemistry      Component Value Date/Time   NA 137 03/25/2021 1332   K 5.5 (H) 03/25/2021 1332   CL 108 03/25/2021 1332   CO2 23 03/25/2021 1332   BUN 44 (H) 03/25/2021 1332   CREATININE 2.95 (H) 03/25/2021 1332   CREATININE 1.97 (H) 06/24/2020 1431      Component Value Date/Time   CALCIUM 9.3 03/25/2021 1332   ALKPHOS 89 03/25/2021 1332   AST 13 (L) 03/25/2021 1332   ALT 8 03/25/2021 1332   BILITOT  0.4 03/25/2021 1332       Impression and Plan: [name] [name] is a very pleasant 65 yo caucasian female withlong standing history of iron deficiency anemia as well as history of DVT's in both the right and left peroneal and posterior tibial veins while she was bed bound in the hospital being treated for osteomyelitis. 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 D-dimer is 1.42. She continues to do well on Xarelto and will continue her same regimen.  Iron studies are pending. We wll replace if needed.  Follow-up in another 6 weeks.  She can contact our office with any questions   Laverna Peace, NP 5/28/202211:58 AM

## 2021-03-25 NOTE — Telephone Encounter (Signed)
Requesting: [facility] Contract: 05/06/2018 UDS: 05/06/2018 Last OV: 03/24/21 Next OV: N/A Last Refill: 05/13/2020, #[id number] RF Database:   Please advise

## 2021-03-26 ENCOUNTER — Encounter: Payer: Self-pay | Admitting: Family

## 2021-03-29 LAB — IRON AND TIBC
Iron: 55 ug/dL (ref 41–142)
Saturation Ratios: 23 % (ref 21–57)
TIBC: 243 ug/dL (ref 236–444)
UIBC: 188 ug/dL (ref 120–384)

## 2021-03-29 LAB — FERRITIN: Ferritin: 475 ng/mL — ABNORMAL HIGH (ref 11–307)

## 2021-03-29 LAB — HOMOCYSTEINE: Homocysteine: 19.3 umol/L — ABNORMAL HIGH (ref 0.0–17.2)

## 2021-03-30 ENCOUNTER — Telehealth: Payer: Self-pay | Admitting: *Deleted

## 2021-03-30 LAB — CARDIOLIPIN ANTIBODIES, IGG, IGM, IGA
Anticardiolipin IgA: <9 APL U/mL (ref 0–11)
Anticardiolipin IgG: 21 GPL U/mL — ABNORMAL HIGH (ref 0–14)
Anticardiolipin IgM: <9 MPL U/mL (ref 0–12)

## 2021-03-30 NOTE — Telephone Encounter (Signed)
03/25/21 los - called and lvm of upcoming appointment - mailed calendar

## 2021-03-31 ENCOUNTER — Other Ambulatory Visit: Payer: Self-pay

## 2021-03-31 ENCOUNTER — Encounter (HOSPITAL_BASED_OUTPATIENT_CLINIC_OR_DEPARTMENT_OTHER): Payer: PRIVATE HEALTH INSURANCE | Attending: Internal Medicine | Admitting: Internal Medicine

## 2021-03-31 DIAGNOSIS — I130 Hypertensive heart and chronic kidney disease with heart failure and stage 1 through stage 4 chronic kidney disease, or unspecified chronic kidney disease: Secondary | ICD-10-CM | POA: Insufficient documentation

## 2021-03-31 DIAGNOSIS — E11319 Type 2 diabetes mellitus with unspecified diabetic retinopathy without macular edema: Secondary | ICD-10-CM | POA: Diagnosis not present

## 2021-03-31 DIAGNOSIS — E11621 Type 2 diabetes mellitus with foot ulcer: Secondary | ICD-10-CM | POA: Diagnosis not present

## 2021-03-31 DIAGNOSIS — L97423 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: Secondary | ICD-10-CM | POA: Insufficient documentation

## 2021-03-31 DIAGNOSIS — N184 Chronic kidney disease, stage 4 (severe): Secondary | ICD-10-CM | POA: Insufficient documentation

## 2021-03-31 DIAGNOSIS — L97514 Non-pressure chronic ulcer of other part of right foot with necrosis of bone: Secondary | ICD-10-CM | POA: Insufficient documentation

## 2021-03-31 DIAGNOSIS — M797 Fibromyalgia: Secondary | ICD-10-CM | POA: Insufficient documentation

## 2021-03-31 DIAGNOSIS — E1122 Type 2 diabetes mellitus with diabetic chronic kidney disease: Secondary | ICD-10-CM | POA: Insufficient documentation

## 2021-03-31 DIAGNOSIS — I509 Heart failure, unspecified: Secondary | ICD-10-CM | POA: Diagnosis not present

## 2021-03-31 DIAGNOSIS — E1142 Type 2 diabetes mellitus with diabetic polyneuropathy: Secondary | ICD-10-CM | POA: Diagnosis not present

## 2021-03-31 NOTE — Progress Notes (Signed)
[name] [name] ([id number]) Visit Report for 03/31/2021 Debridement Details Patient Name: Date of Service: [name] [name] RE, [name] 03/31/2021 3:15 PM Medical Record Number: [id number] Patient Account Number: [id number] Date of Birth/Sex: Treating RN: 05-Jun-1956 (65 y.o. [name] [name], [name] [name] Primary Care Provider: [name] Other Clinician: Referring Provider: Treating Provider/Extender: [name] in Treatment: 11 Debridement Performed for Assessment: Wound #10 Right,Lateral Foot Performed By: Physician Ricard Dillon., MD Debridement Type: Debridement Severity of Tissue Pre Debridement: Fat layer exposed Level of Consciousness (Pre-procedure): Awake and Alert Pre-procedure Verification/Time Out Yes - 15:55 Taken: Start Time: 15:56 Pain Control: Lidocaine 4% T opical Solution T Area Debrided (L x W): otal 2 (cm) x 2.1 (cm) = 4.2 (cm) Tissue and other material debrided: Viable, Non-Viable, Callus, Slough, Subcutaneous, Skin: Dermis , Fibrin/Exudate, Slough Level: Skin/Subcutaneous Tissue Debridement Description: Excisional Instrument: Blade Bleeding: Moderate Hemostasis Achieved: Silver Nitrate End Time: 16:00 Procedural Pain: 0 Post Procedural Pain: 0 Response to Treatment: Procedure was tolerated well Level of Consciousness (Post- Awake and Alert procedure): Post Debridement Measurements of Total Wound Length: (cm) 2 Width: (cm) 2.1 Depth: (cm) 0.2 Volume: (cm) 0.66 Character of Wound/Ulcer Post Debridement: Improved Severity of Tissue Post Debridement: Fat layer exposed Post Procedure Diagnosis Same as Pre-procedure Electronic Signature(s) Signed: 03/31/2021 4:50:31 PM By: Linton Ham MD Signed: 03/31/2021 5:16:10 PM By: Deon Pilling Entered By: Linton Ham on 03/31/2021 16:35:56 -------------------------------------------------------------------------------- HPI Details Patient Name: Date of Service: [name] [name], [name]  03/31/2021 3:15 PM Medical Record Number: [id number] Patient Account Number: [id number] Date of Birth/Sex: Treating RN: Aug 01, 1956 (65 y.o. [name] [name] Primary Care Provider: [name] Other Clinician: Referring Provider: Treating Provider/Extender: [name] in Treatment: 11 History of Present Illness HPI Description: ADMISSION 11/12/2018 This is a 65 year old woman with type 2 diabetes and diabetic neuropathy. She has been dealing with a left heel plantar wound for roughly 2 years. She states this started when she pulled some skin off the area and it progressed into a wound. She also has an area on the tip of her left great toe for 1 year. She has been largely followed by Dr. Sharol Given and she had an excision of bone in the left heel in 2017. I do not see microbiology from this excision or pathology. Apparently this wound never really closed. She most recently has been using Silvadene cream. Offloading this with a scooter. The last MRI I see was in June 2018 which did not show osteomyelitis at that time. Apparently this wound is never really progressed towards healing. During her last review by Dr. Sharol Given in October it was recommended that she undergo a left BKA and she refused. She went to see a second orthopedic consult at [facility] who am recommended conservative wound care to see if this will close or progress towards closure but also warned that possible surgery may be necessary. An x-ray that was done at [facility] showed an irregular calcaneal body and calcaneal tuberosity. This is probably postprocedural. Previous x-rays at [facility] had suggested heterotrophic calcifications but I do not see this. Apparently a second ointment was added to the Silvadene which the patient thinks is because some improvement. She has not been systemically unwell. No fever or chills. She thinks the second ointment that was given to her at [facility] has helped somewhat. 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 ABIs done in June 2018 showed a ABI of 1.12 on the right and 1.1 on the left she was biphasic bilaterally. ABIs in our clinic were noncompressible today. 11/20/18 on evaluation today patient is seen for her second visit here in the office although this is actually the first visit with me concerning an issue that she's having with her great toe and heel of the left foot. Fortunately she does not appear to be have any discomfort at this time and she does have a scooter in order to offload her foot she also has a boot for offloading. With that being said this is something that appears to have been going on for some time she was seeing Dr. due to his recommendation was that she was going to require a below knee amputation. With that being said she wanted to come to the wound center but according to the patient Dr. Sharol Given told her that "we could not help her". Nonetheless she saw another provider who suggested that it may be worth a shot for [name] to try and help her out if it all possible. Nonetheless she decided that it would be worth a trial since otherwise any the way she's gonna end up with an amputation. Obviously if we can heal the wound then that will not be the case. Again I explained to her that obviously there are no guarantees but we will give this a good try and attempts to get the wound to heal. 1/30; the patient continues to have areas on the left plantar heel and the left plantar great toe. The more worrisome area is the heel. She went for MRI on Saturday but this could not be done because she had silver alginate in the wound bed. I would like to get this rebooked.  If she does not have osteomyelitis she will need a total contact cast. We have been using silver alginate in the wounds 2/7; patient has wounds on her left plantar heel and left plantar great toe. The area on the heel has some depth although it looks about the same today. Her MRI will finally be done tomorrow. If she has osteomyelitis in the heel and then we will need to consider her for IV antibiotics and hyperbaric oxygen. If the MRI is negative she will need a total contact cast although she is wearing a cam boot and using a scooter at present. Will be using silver alginate. She will take it off tomorrow in preparation for the MRI 2/14; the patient's MRI showed extensive surgical changes with a large portion of her calcaneus removed on the left secondary to her previous surgery by Dr. Sharol Given however there is no evidence of osteomyelitis. She would therefore be a candidate for a total contact cast and we applied this for the first time today 2/21; silver collagen total contact cast. The area on the plantar left great toe is "healed" still a lot of callus on this area. Dimensions on the plantar heel not too much different some epithelialization is present however. This is an improvement 12/25/18 on evaluation today patient actually is seen for follow-up concerning issues that she has been having with her cast she states she feels like it's wet and squishy in the bottom of her cast. The reason she does come in to have  this evaluated and see what is going on. Fortunately there's no signs of infection at this time was the cast was removed. 3/6; the patient's area on the tip of her right great toe is callused but there is no open area here. She still has the fairly extensive area in the left heel. We have been using silver collagen in the wound. 01/08/19 on evaluation today patient actually appears to be doing very well in regard to her heel ulcer in my pinion to see if you shown signs of improvement which  is excellent news. There's no evidence of infection. She continues to have quite a bit of drainage but fortunately again this doesn't seem to be hindering her healing. 3/20 -Patient's foot ulcer on the left appears to be doing well, the dimensions appear encouraging, we have been using total contact cast with Prisma and will continue doing that 3/27; patient continues to make nice progress on the left plantar heel. She is using silver collagen under a total contact cast. It is been a while since I have seen this wound and it really looks a lot better. Smaller with healthy granulation 4/3; she continues to make nice progress on the left plantar heel. Using silver collagen under a total contact cast 4/10; left plantar heel. Again skin over the surface of the wound with not much in the way of adherence. This results in undermining. Using silver collagen changed to silver alginate 4/17 left plantar heel. Again not much improvement. There is no undermining today no debridement was required we used silver alginate last time because of excessive moisture 4/24 left plantar heel. Again not as much improvement as I would have liked. About 3 mm of depth. Thick callused skin around the circumference. Using silver alginate 5/1; left plantar heel this is improved this week. Less depth epithelialization is present. Using silver alginate alginate under a total contact cast 5/8; left plantar heel dimensions are about the same. The depth appears to be improved. I use silver collagen starting today under the cast 5/15 left plantar heel. Arrives today with a 2-day history of feeling like something had "slipped" while walking her dog in the cast. Unfortunately extensive area relatively to the small wound of undermining superiorly denuded epithelium. 5/22-Patient returns at 1 week after being taken off the [facility] on account of some fluid collection that was debrided and cultured at last visit from the plantar ulcer on the  left heel. The culture results are polymicrobial with Klebsiella, enterococcus, Proteus growth these organisms are sensitive to Cipro except with enterococcus which is sensitive to ampicillin but patient is highly allergic to penicillin according to her. 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 5/29; the patient arrived last week with a new wound on her left plantar great toe. With regards to the culture that I did 2 weeks ago of her deteriorating heel wound this grew Klebsiella and enterococcus. She was given Cipro however the enterococcus would not be covered well by a quinolone. She is allergic to penicillin. I will give her linezolid 600 twice daily x5 days 6/12; the patient continues to have a wound on her left and after she returns to the beach there is undermining laterally. She has the new wound from this 2 weeks or so ago on the plantar tip of her left great toe. She had a fall today scraping the dorsal surface of the left fifth 7/14;  READMISSION since the patient was last here she was hospitalized from 04/15/2019 through 04/22/2019. She had presented to an outside ER after falling and hitting her elbow. She had had previous surgery on the elbow and fractures several years ago. An x-ray was negative and she was sent home. She is readmitted with sepsis and acute renal failure secondary to septic arthritis of the elbow. Cultures apparently showed pansensitive staph aureus however she has a severe beta-lactam allergy. She was treated with vancomycin. Apparently the vancomycin is completed and her PICC line is removed although she is seeing Dr. Megan Salon tomorrow due to continued pain and swelling. In the hospital she had an IandD by Dr. [name] of orthopedics. The original surgery was on 04/17/2019 and it was felt that she had septic arthritis. She is still having a lot of pain and swelling in the right elbow and  apparently is due to see Dr. Megan Salon tomorrow and what she thinks is that she will be restarted on antibiotics. With regards to her heel wounds/left foot wounds. Her arterial studies were checked and her ABIs were within normal limits. X-ray showed plantar foot ulcer negative for osteomyelitis postop resection of the posterior calcaneus. She has been using silver alginate to the heel 8/4-Patient returns after being seen on 7/14, we are using silver alginate to the calcaneal wound, she is continuing to receive care for her right elbow septic arthritis 8/14- Patient returns after 1 week, she is being seen by the surgical group for her right elbow, she is here for the left calcaneal wound for which we are using silver alginate this is about the same 8/21; this is a patient I readmitted to the clinic 5 weeks ago. She is continuing to have difficulties with a septic arthritis of the right elbow she had after a fall and apparently has had surgical IandD's since the last time we saw her. She is also following with Dr. Megan Salon of infectious disease and is apparently on 3 oral antibiotics although at the time of this dictation I am not sure what they are. She comes in today with a necrotic surface on the left great toe with a blister laterally. This was still clearly an open wound. She had purulent drainage coming out of this. The original wound on the plantar aspect of her right heel in the setting of a Charcot foot is deep not open to bone but certainly not any better at all. We have been using silver alginate 8/28; dealing with septic arthritis of the right elbow. May need to go for further surgery here in the second week of September. She had a blister on the left great toe that was purulent [place] draining last week although a culture did not grow anything [already on antibiotics through infectious disease for her elbow]. The punched-out area on her heel is just like it was when she first came in. This  almost closed with a total contact cast there are no options for that now. The patient states she cannot stay off her foot having to do housework X-ray of the foot showed a moderate bunion and severe degenerative changes at the first metatarsal phalangeal joint there was a moderate plantar calcaneal spur. We will need to check the location of this. 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 It is oral. We are using  silver collagen to the punched-out area on her left heel and to the summer superficial area of the left great toe. She is offloading this is best she can although judging by the amount of callus it is not enough. She thinks she has enough strength in her right elbow now to use her scooter. 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 Hale Bogus notes and infectious disease. We are using silver collagen to the punched-out areas on her left heel and to the plantar aspect of her left great toe she is now using her scooter to get around and to help offload these areas 10/2; 2-week follow-up. Still on clindamycin I believe for the left elbow she is going for an MRI of the elbow over the weekend. She is then going to see orthopedics and infectious disease. The area on the left heel is certainly no better. This does not probe to bone however there is green drainage. She has an area on the tip of her toe. The area on the heel is in a wound we almost closed at one point with total contact casting. 10/9; one-week follow-up. Culture I did of the heel last week which was a swab culture admittedly showed moderate Enterococcus faecalis moderate Pseudomonas. The wound itself looks about the  same. There is no palpable bone although the depth of it closely approximates bone. The heel is swollen but not erythematous. Her MRI is booked for next week. She also has an MRI of the right elbow. I've also lifted the last consult from Dr. Megan Salon who is following her for septic arthritis of the right elbow. I did not see him specifically comment on her left heel however the patient states that he is aware of this. I did not specifically address the organisms I cultured last week because of the possible effect of any additional cultures that will be done on the elbow. Additionally these were superficial not bone cultures 10/16; her MRI of the heel was put back to next week. She did have her elbow done. I have been in contact with Dr. Megan Salon about my concerns about osteomyelitis of the left heel. We have been using collagen. She also has a wound on the plantar tip of her first toe 10/23; her MRI of the heel was negative for osteomyelitis but suggested a small abscess. She also had an MRI of her elbow that showed findings compatible with a septic joint. She went to see Dr. Megan Salon and she is going to have both oral and IV antibiotics which I am sure will cover any infection in the heel. I did not actually see his note today. We are using silver collagen offloading the heel with a heel offloading boot 11/6; patient continues with silver collagen to both wound areas on her plantar left heel and plantar left great toe. She remains on daptomycin by Dr. Megan Salon of infectious disease. She complains of diarrhea. Dr. Megan Salon is aware of this. 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 she has been put on oral vancomycin I think a lot of high clinical suspicion for pseudomembranous colitis Because of the right elbow there are no options to completely off her  left foot  beyond the heel offloading boot we have now. Fortunately the left heel ulcer itself has remained static some improvement in the plantar left great toe 11/20; patient arrives for review of her left heel ulcer. I also note she has a difficult time with septic arthritis of the right elbow. She currently is on IV daptomycin which seems to have helped the drainage in her elbow [MSSA]. Because of high concern for pseudomembranous colitis she was also put on vancomycin orally and Flagyl orally. She was on Levaquin but that was discontinued because of the concern about C. difficile. She states her diarrhea is better. She arrives in clinic today with a large area of denuded skin on the lateral part of the heel. This had totally separated. We have been using silver alginate to the wound areas. She arrived in a scooter telling me she is offloading the foot is much as possible. 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 Dr. Megan Salon stopped the daptomycin and now has her on vancomycin 1000 mg IV daily. This is supposed to go onto 1/18. She is being referred to [place] for what sounds like a elbow replacement type operation for her MSSA septic arthritis. She arrives in clinic today with a blister on the medial part of the wound on her heel. She still has a lot of callus around the heel although the surface of the wound on the left heel looks somewhat better. READMISSION 03/19/2020 Mrs. Baldridge is a 65 year old woman we have followed for almost all of 2020 with a neuropathic wound on her left heel and the tip of her left great toe.. We she had previously had a partial calcanectomy by Dr. Sharol Given because of underlying osteomyelitis. She also had a history of MRSA even when we admitted her to the clinic. An MRI when she  first came into our clinic did not show osteomyelitis. We put her in a total contact cast and gradually this contracted to the point it was almost closed however in that same timeframe she had a fall. 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 We had to stop putting her in a cast partially because of infection and partially because she could not support herself with the elbow. Things went progressively downhill. When we last saw her at the end of December she had a large open wound on the left heel. She had had recurrent infections in the right elbow. It was felt that either the heel lift: Ice her elbow or vice versa. We never had MRSA I do not believe cultured in the left heel however. 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 She underwent an operative debridement and a T11-L3 fusion. An operative culture grew Pseudomonas. She was treated with cefepime and required readmission to the hospital with acute kidney failure requiring temporary dialysis. She was discharged to rehab I believe she signed herself out [place]. He has been following with her primary doctor and she comes in the clinic today with miraculously the left heel totally closed. She is followed up with Dr. Megan Salon of infectious disease he does not feel she has any evidence of infection in her elbow or her heel for that matter and the heel is actually fully epithelialized. She  is on ciprofloxacin 500 twice daily I think mostly directed at Pseudomonas. The patient is convinced that it was the treatment of the Pseudomonas in her back that ultimately led to closure of her heel and that certainly possible or could have been because she was nonambulatory in the hospital for 3 months according to her. She is still using her heel offloading boot and she says she is "learning to walk again" Readmission: 06/09/2020 patient presents today for  reevaluation here in the clinic following a reopening of her heel ulcer. She tells [name] that this actually occurred about 2 weeks after she was last seen in May of this year though it is now the beginning of August and she has not come back until now to see [name]. Nonetheless she does have an open wound there is been no x-rays at this time infectious disease has been monitoring him following this up until this point. They made the recommendation that she come to see [name]. Fortunately there is no signs of active infection systemically though she does tell me that she had Pseudomonas in the spine when she had her back surgery she is currently on antibiotics for that. 8/20; this is a patient who I saw 1 time in late May. Apparently shortly after she was here the area on her plantar left heel started to drain again and she had an open wound. She has been going to her primary physician and has been following with Dr. Megan Salon. 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 She is currently on 3 times a day prophylactic ciprofloxacin. She is admitted to the clinic last week for review of the wound on the left heel. Notable for the fact that she had a partial calcanectomy by Dr. Sharol Given 3 or 4 years ago. She says she is using her motorized wheelchair again as an off loader but she came in in regular shoes. 9/2; 2-week follow-up. Plantar left heel. In spite of debridement last week she had thick very adherent callus around the circumference of the wound and a nonviable gray surface. We have been using Hydrofera Blue. We gave her a heel offloading boot last week. She says she is off her foot except for she needs to walk her dog. 9/17; 2 week follow-up. Lives with a heel ulcer in roughly the same condition as a month ago. Though requiring debridement and revision using Hydrofera Blue. She says she is often except for times that she  needs to walk her dog and she claims to be using the heel offloading [name]. She has a new injury which she says was a burn on the medial part of her fifth metacarpophalangeal on the right. 10/15; almost 1 month follow-up. She arrives with the plantar left heel ulcer and not a very good condition. She has thick callus surrounding the wound and a necrotic base over the top of this. I am not sure anybody is paying close attention to this. She says she walks minimally but I highly doubt this. She is supposed to be using Hydrofera Blue. She says she has plenty of supplies. She tells me that she is traveling to [place] next week for a 10-day trip. We will see her back after that She has both been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

## 2021-04-04 NOTE — Progress Notes (Addendum)
[name], [name] ([id number]) Visit Report for 03/31/2021 Arrival Information Details Patient Name: Date of Service: [name] [name] [name], [name] 03/31/2021 3:15 PM Medical Record Number: [id number] Patient Account Number: [id number] Date of Birth/Sex: Treating RN: 1956-09-03 (65 y.o. [name] [name] Primary Care Abijah Roussel: [name] Other Clinician: Referring Keithan Dileonardo: Treating Dominic Mahaney/Extender: [name] in Treatment: 11 Visit Information History Since Last Visit Added or deleted any medications: No Patient Arrived: Other Any new allergies or adverse reactions: No Arrival Time: 15:23 Had a fall or experienced change in No Accompanied By: self activities of daily living that may affect Transfer Assistance: None risk of falls: Patient Identification Verified: Yes Signs or symptoms of abuse/neglect since last visito No Secondary Verification Process Completed: Yes Hospitalized since last visit: No Patient Requires Transmission-Based Precautions: No Implantable device outside of the clinic excluding No Patient Has Alerts: No cellular tissue based products placed in the center since last visit: Has Dressing in Place as Prescribed: Yes Pain Present Now: Yes Electronic Signature(s) Signed: 04/01/2021 5:48:41 PM By: [name] Entered By: [name] on 03/31/2021 15:24:05 -------------------------------------------------------------------------------- Lower Extremity Assessment Details Patient Name: Date of Service: [name] [name] [name] [name], [name] [name] 03/31/2021 3:15 PM Medical Record Number: [id number] Patient Account Number: [id number] Date of Birth/Sex: Treating RN: 08-16-1956 (65 y.o. [name] Primary Care [name] [name]: [name] Other Clinician: Referring Tamatha Gadbois: Treating Jesten Cappuccio/Extender: [name] in Treatment: 11 Edema Assessment Assessed: Shirlyn Goltz: No] Patrice Paradise: No] Edema: [Left: Yes] [Right:  Yes] Calf Left: Right: Point of Measurement: 35 cm From Medial Instep 35 cm 34.5 cm Ankle Left: Right: Point of Measurement: 8 cm From Medial Instep 22 cm 22.5 cm Vascular Assessment Pulses: Dorsalis Pedis Palpable: [Left:Yes] [Right:Yes] Electronic Signature(s) Signed: 03/31/2021 5:38:56 PM By: Baruch Gouty RN, BSN Entered By: Baruch Gouty on 03/31/2021 15:40:59 -------------------------------------------------------------------------------- Multi Wound Chart Details Patient Name: Date of Service: [name] [name], [name] [name] 03/31/2021 3:15 PM Medical Record Number: [id number] Patient Account Number: [id number] Date of Birth/Sex: Treating RN: 08/25/1956 (65 y.o. [name] [name], [name] Primary Care Lionardo Haze: [name] Other Clinician: Referring [name] [name]: Treating [name]/Extender: [name] in Treatment: 11 Vital Signs Height(in): 61 Capillary Blood Glucose(mg/dl): 111 Weight(lbs): 162 Pulse(bpm): 31 Body Mass Index(BMI): 31 Blood Pressure(mmHg): 151/77 Temperature(F): 99.1 Respiratory Rate(breaths/min): 18 Photos: [10:No Photos Right, Lateral Foot] [9:No Photos Left Calcaneus] [N/A:N/A N/A] Wound Location: [10:Gradually Appeared] [9:Gradually Appeared] [N/A:N/A] Wounding Event: [10:Diabetic Wound/Ulcer of the Lower] [9:Diabetic Wound/Ulcer of the Lower] [N/A:N/A] Primary Etiology: [10:Extremity Cataracts, Anemia, Sleep Apnea,] [9:Extremity Cataracts, Anemia, Sleep Apnea,] [N/A:N/A] Comorbid History: [10:Coronary Artery Disease, Hypertension, Myocardial Infarction, Hypertension, Myocardial Infarction, Type II Diabetes, Osteoarthritis, Osteomyelitis, Neuropathy 09/29/2020] [9:Coronary Artery Disease, Type II Diabetes, Osteoarthritis,  Osteomyelitis, Neuropathy 01/11/2019] [N/A:N/A] Date Acquired: [10:11] [9:11] [N/A:N/A] Weeks of Treatment: [10:Open] [9:Open] [N/A:N/A] Wound Status: [10:2x2.1x0.2] [9:4x3x0.7] [N/A:N/A] Measurements L x W x D  (cm) [10:3.299] [9:9.425] [N/A:N/A] A (cm) : rea [10:0.66] [9:6.597] [N/A:N/A] Volume (cm) : [10:68.90%] [9:18.90%] [N/A:N/A] % Reduction in A [10:rea: 95.60%] [9:74.20%] [N/A:N/A] % Reduction in Volume: [10:Grade 2] [9:Grade 2] [N/A:N/A] Classification: [10:Medium] [9:Medium] [N/A:N/A] Exudate A mount: [10:Serosanguineous] [9:Serosanguineous] [N/A:N/A] Exudate Type: [10:red, brown] [9:red, brown] [N/A:N/A] Exudate Color: [10:Thickened] [9:Thickened] [N/A:N/A] Wound Margin: [10:Large (67-100%)] [9:Large (67-100%)] [N/A:N/A] Granulation A mount: [10:Red] [9:Red] [N/A:N/A] Granulation Quality: [10:None Present (0%)] [9:None Present (0%)] [N/A:N/A] Necrotic A mount: [10:Fat Layer (Subcutaneous Tissue): Yes Fat Layer (Subcutaneous Tissue): Yes N/A] Exposed Structures: [10:Fascia: No Tendon: No Muscle: No Joint: No Bone: No Small (  1-33%)] [9:Fascia: No Tendon: No Muscle: No Joint: No Bone: No Small (1-33%)] [N/A:N/A] Epithelialization: [10:Debridement - Excisional] [9:N/A] [N/A:N/A] Debridement: Pre-procedure Verification/Time Out 15:55 [9:N/A] [N/A:N/A] Taken: [10:Lidocaine 4% T opical Solution] [9:N/A] [N/A:N/A] Pain Control: [10:Callus, Subcutaneous, Slough] [9:N/A] [N/A:N/A] Tissue Debrided: [10:Skin/Subcutaneous Tissue] [9:N/A] [N/A:N/A] Level: [10:4.2] [9:N/A] [N/A:N/A] Debridement A (sq cm): [10:rea Blade] [9:N/A] [N/A:N/A] Instrument: [10:Moderate] [9:N/A] [N/A:N/A] Bleeding: [10:Silver Nitrate] [9:N/A] [N/A:N/A] Hemostasis A chieved: [10:0] [9:N/A] [N/A:N/A] Procedural Pain: [10:0] [9:N/A] [N/A:N/A] Post Procedural Pain: [10:Procedure was tolerated well] [9:N/A] [N/A:N/A] Debridement Treatment Response: [10:2x2.1x0.2] [9:N/A] [N/A:N/A] Post Debridement Measurements L x W x D (cm) [10:0.66] [9:N/A] [N/A:N/A] Post Debridement Volume: (cm) [10:calloused periwound] [9:calloused periwound] [N/A:N/A] Assessment Notes: [10:Debridement] [9:N/A] [N/A:N/A] Treatment Notes Wound #10  (Foot) Wound Laterality: Right, Lateral Cleanser Normal Saline Discharge Instruction: Cleanse the wound with Normal Saline prior to applying a clean dressing using gauze sponges, not tissue or cotton balls. Soap and Water Discharge Instruction: May shower and wash wound with dial antibacterial soap and water prior to dressing change. Peri-Wound Care Topical Primary Dressing Promogran Prisma Matrix, 4.34 (sq in) (silver collagen) Discharge Instruction: Moisten collagen with saline or hydrogel Secondary Dressing Woven Gauze Sponge, Non-Sterile 4x4 in Discharge Instruction: Apply over primary dressing as directed. ABD Pad, 5x9 Discharge Instruction: Apply over primary dressing as directed. Secured With [employer], 4.5x3.1 (in/yd) Discharge Instruction: Secure with Kerlix as directed. Transpore Surgical Tape, 2x10 (in/yd) Discharge Instruction: Secure dressing with tape as directed. Compression Wrap Compression Stockings Add-Ons Wound #9 (Calcaneus) Wound Laterality: Left Cleanser Normal Saline Discharge Instruction: Cleanse the wound with Normal Saline prior to applying a clean dressing using gauze sponges, not tissue or cotton balls. Soap and Water Discharge Instruction: May shower and wash wound with dial antibacterial soap and water prior to dressing change. Peri-Wound Care Topical Primary Dressing Promogran Prisma Matrix, 4.34 (sq in) (silver collagen) Discharge Instruction: Moisten collagen with saline or hydrogel Secondary Dressing Woven Gauze Sponge, Non-Sterile 4x4 in Discharge Instruction: Apply over primary dressing as directed. ABD Pad, 5x9 Discharge Instruction: Apply over primary dressing as directed. Secured With [employer], 4.5x3.1 (in/yd) Discharge Instruction: Secure with Kerlix as directed. Transpore Surgical Tape, 2x10 (in/yd) Discharge Instruction: Secure dressing with tape as directed. Compression Wrap Compression  Stockings Add-Ons Electronic Signature(s) Signed: 03/31/2021 4:50:31 PM By: Linton Ham MD Signed: 03/31/2021 5:16:10 PM By: Deon Pilling Entered By: Linton Ham on 03/31/2021 16:35:41 -------------------------------------------------------------------------------- Multi-Disciplinary Care Plan Details Patient Name: Date of Service: [name] [name], [name] [name] 03/31/2021 3:15 PM Medical Record Number: [id number] Patient Account Number: [id number] Date of Birth/Sex: Treating RN: 1955-11-10 (65 y.o. [name] [name] Primary Care Willmar Stockinger: [name] Other Clinician: Referring Jeremia Groot: Treating Loyd Salvador/Extender: [name] in Treatment: 11 Active Inactive Electronic Signature(s) Signed: 05/17/2021 6:08:27 PM By: Deon Pilling Previous Signature: 03/31/2021 5:16:10 PM Version By: Deon Pilling Entered By: Deon Pilling on 05/17/2021 18:08:27 -------------------------------------------------------------------------------- Pain Assessment Details Patient Name: Date of Service: [name] [name] [name] [name], [name] [name] 03/31/2021 3:15 PM Medical Record Number: [id number] Patient Account Number: [id number] Date of Birth/Sex: Treating RN: [birth year] (65 y.o. [name] [name] Primary Care Cameryn Schum: [name] Other Clinician: Referring Tulani Kidney: Treating Geoffrey Mankin/Extender: [name] in Treatment: 11 Active Problems Location of Pain Severity and Description of Pain Patient Has Paino Yes Site Locations Pain Location: Generalized Pain Rate the pain. Current Pain Level: 6 Worst Pain Level: 6 Least Pain Level: 0 Character of Pain Describe the Pain: Aching Pain Management and Medication Current Pain  Management: Medication: Yes Is the Current Pain Management Adequate: Adequate How does your wound impact your activities of daily livingo Sleep: Yes Bathing: No Appetite: No Relationship With Others: No Bladder Continence:  No Emotions: No Bowel Continence: No Work: No Toileting: No Drive: No Dressing: No Hobbies: No Electronic Signature(s) Signed: 03/31/2021 5:16:10 PM By: Deon Pilling Signed: 03/31/2021 5:38:56 PM By: Baruch Gouty RN, BSN Entered By: Baruch Gouty on 03/31/2021 15:39:53 -------------------------------------------------------------------------------- Patient/Caregiver Education Details Patient Name: Date of Service: [name] [name], [name] [name] 6/2/2022andnbsp3:15 PM Medical Record Number: [id number] Patient Account Number: [id number] Date of Birth/Gender: Treating RN: 1956/08/01 (65 y.o. [name] [name] Primary Care Physician: [name] Other Clinician: Referring Physician: Treating Physician/Extender: [name] in Treatment: 11 Education Assessment Education Provided To: Patient Education Topics Provided Wound/Skin Impairment: Handouts: Skin Care Do's and Dont's Methods: Explain/Verbal Responses: Reinforcements needed Electronic Signature(s) Signed: 03/31/2021 5:16:10 PM By: Deon Pilling Entered By: Deon Pilling on 03/31/2021 15:48:21 -------------------------------------------------------------------------------- Wound Assessment Details Patient Name: Date of Service: [name] [name] [name] [name], [name] [name] 03/31/2021 3:15 PM Medical Record Number: [id number] Patient Account Number: [id number] Date of Birth/Sex: Treating RN: 14-Mar-1956 (65 y.o. [name] [name], [name] Primary Care Rosanne Wohlfarth: [name] Other Clinician: Referring Emoree Sasaki: Treating Kimbely Whiteaker/Extender: [name] in Treatment: 11 Wound Status Wound Number: 10 Primary Diabetic Wound/Ulcer of the Lower Extremity Etiology: Wound Location: Right, Lateral Foot Wound Open Wounding Event: Gradually Appeared Status: Date Acquired: 09/29/2020 Comorbid Cataracts, Anemia, Sleep Apnea, Coronary Artery Disease, Weeks Of Treatment: 11 Weeks Of Treatment:  11 History: Hypertension, Myocardial Infarction, Type II Diabetes, Clustered Wound: No Osteoarthritis, Osteomyelitis, Neuropathy Photos Wound Measurements Length: (cm) 2 Width: (cm) 2.1 Depth: (cm) 0.2 Area: (cm) 3.299 Volume: (cm) 0.66 % Reduction in Area: 68.9% % Reduction in Volume: 95.6% Epithelialization: Small (1-33%) Tunneling: No Undermining: No Wound Description Classification: Grade 2 Wound Margin: Thickened Exudate Amount: Medium Exudate Type: Serosanguineous Exudate Color: red, brown Foul Odor After Cleansing: No Slough/Fibrino Yes Wound Bed Granulation Amount: Large (67-100%) Exposed Structure Granulation Quality: Red Fascia Exposed: No Necrotic Amount: None Present (0%) Fat Layer (Subcutaneous Tissue) Exposed: Yes Tendon Exposed: No Muscle Exposed: No Joint Exposed: No Bone Exposed: No Assessment Notes calloused periwound Electronic Signature(s) Signed: 04/01/2021 5:48:41 PM By: [name] Signed: 04/04/2021 5:44:33 PM By: Deon Pilling Previous Signature: 03/31/2021 5:16:10 PM Version By: Deon Pilling Previous Signature: 03/31/2021 5:38:56 PM Version By: Baruch Gouty RN, BSN Entered By: [name] on 04/01/2021 17:24:04 -------------------------------------------------------------------------------- Wound Assessment Details Patient Name: Date of Service: [name] [name], [name] [name] 03/31/2021 3:15 PM Medical Record Number: [id number] Patient Account Number: [id number] Date of Birth/Sex: Treating RN: [birth year] (65 y.o. [name] [name], [name] Primary Care Mykira Hofmeister: [name] Other Clinician: Referring Gregg Winchell: Treating Uno Esau/Extender: [name] in Treatment: 11 Wound Status Wound Number: 9 Primary Diabetic Wound/Ulcer of the Lower Extremity Etiology: Wound Location: Left Calcaneus Wound Open Wounding Event: Gradually Appeared Status: Date Acquired: 01/11/2019 Comorbid Cataracts, Anemia, Sleep Apnea,  Coronary Artery Disease, Weeks Of Treatment: 11 History: Hypertension, Myocardial Infarction, Type II Diabetes, Clustered Wound: No Osteoarthritis, Osteomyelitis, Neuropathy Photos Wound Measurements Length: (cm) 4 Width: (cm) 3 Depth: (cm) 0.7 Area: (cm) 9.425 Volume: (cm) 6.597 % Reduction in Area: 18.9% % Reduction in Volume: 74.2% Epithelialization: Small (1-33%) Tunneling: No Undermining: No Wound Description Classification: Grade 2 Wound Margin: Thickened Exudate Amount: Medium Exudate Type: Serosanguineous Exudate Color: red, brown Foul Odor After Cleansing: No Slough/Fibrino Yes Wound Bed Granulation Amount: Large (67-100%)  Exposed Structure Granulation Quality: Red Fascia Exposed: No Necrotic Amount: None Present (0%) Fat Layer (Subcutaneous Tissue) Exposed: Yes Tendon Exposed: No Muscle Exposed: No Joint Exposed: No Bone Exposed: No Assessment Notes calloused periwound Electronic Signature(s) Signed: 04/01/2021 5:48:41 PM By: [name] Signed: 04/04/2021 5:44:33 PM By: Deon Pilling Previous Signature: 03/31/2021 5:16:10 PM Version By: Deon Pilling Previous Signature: 03/31/2021 5:38:56 PM Version By: Baruch Gouty RN, BSN Entered By: [name] on 04/01/2021 17:23:12 -------------------------------------------------------------------------------- [place] Details Patient Name: Date of Service: [name] [name], [name] [name] 03/31/2021 3:15 PM Medical Record Number: [id number] Patient Account Number: [id number] Date of Birth/Sex: Treating RN: May 27, 1956 (65 y.o. [name] [name], Tammi Klippel Primary Care Floye Fesler: [name] Other Clinician: Referring Kristee Angus: Treating Sahmya Arai/Extender: [name] in Treatment: 11 Vital Signs Time Taken: 15:24 Temperature (F): 99.1 Height (in): 61 Pulse (bpm): 86 Weight (lbs): 162 Respiratory Rate (breaths/min): 18 Body Mass Index (BMI): 30.6 Blood Pressure (mmHg): 151/77 Capillary  Blood Glucose (mg/dl): 111 Reference Range: 80 - 120 mg / dl Electronic Signature(s) Signed: 04/01/2021 5:48:41 PM By: [name] Entered By: [name] on 03/31/2021 15:25:07

## 2021-04-09 ENCOUNTER — Ambulatory Visit (HOSPITAL_COMMUNITY): Payer: PRIVATE HEALTH INSURANCE

## 2021-04-10 ENCOUNTER — Ambulatory Visit (HOSPITAL_COMMUNITY): Payer: PRIVATE HEALTH INSURANCE

## 2021-04-11 ENCOUNTER — Other Ambulatory Visit: Payer: Self-pay | Admitting: Family Medicine

## 2021-04-11 ENCOUNTER — Ambulatory Visit (HOSPITAL_COMMUNITY): Payer: PRIVATE HEALTH INSURANCE

## 2021-04-11 DIAGNOSIS — Z981 Arthrodesis status: Secondary | ICD-10-CM

## 2021-04-11 DIAGNOSIS — M8610 Other acute osteomyelitis, unspecified site: Secondary | ICD-10-CM

## 2021-04-11 DIAGNOSIS — G894 Chronic pain syndrome: Secondary | ICD-10-CM

## 2021-04-11 MED ORDER — HYDROCODONE-ACETAMINOPHEN 5-325 MG PO TABS
1.0000 | ORAL_TABLET | Freq: Four times a day (QID) | ORAL | 0 refills | Status: DC | PRN
Start: 2021-04-11 — End: 2021-05-10

## 2021-04-11 NOTE — Telephone Encounter (Signed)
Medication: HYDROcodone-acetaminophen (NORCO/VICODIN) 5-325 MG tablet   Has the patient contacted their pharmacy? Yes.   (If no, request that the patient contact the pharmacy for the refill.) (If yes, when and what did the pharmacy advise?)  Preferred Pharmacy (with phone number or street name):  [facility] [address] [place], [place] - [address]  [address] [place] [address]  Phone:  [phone]  Fax:  [place] [facility]: Please be advised that RX refills may take up to 3 business days. We ask that you follow-up with your pharmacy.

## 2021-04-11 NOTE — Telephone Encounter (Signed)
Requesting: NORCO Contract: 05/06/18 UDS: 05/06/18 Last OV: 03/24/21 Next OV: N/A Last Refill: 03/11/21, #120--0 RF Database:   Please advise

## 2021-04-14 ENCOUNTER — Encounter (HOSPITAL_BASED_OUTPATIENT_CLINIC_OR_DEPARTMENT_OTHER): Payer: PRIVATE HEALTH INSURANCE | Admitting: Internal Medicine

## 2021-04-15 ENCOUNTER — Ambulatory Visit (HOSPITAL_COMMUNITY)
Admission: RE | Admit: 2021-04-15 | Discharge: 2021-04-15 | Disposition: A | Discharge status: Home / Self Care | Payer: PRIVATE HEALTH INSURANCE | Source: Ambulatory Visit | Attending: Internal Medicine | Admitting: Internal Medicine

## 2021-04-15 DIAGNOSIS — L97423 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: Secondary | ICD-10-CM | POA: Insufficient documentation

## 2021-04-15 MED ORDER — GADOBUTROL 1 MMOL/ML IV SOLN
7.5000 mL | Freq: Once | INTRAVENOUS | Status: AC | PRN
  Administered 2021-04-15: 7.5 mL via INTRAVENOUS

## 2021-04-21 ENCOUNTER — Encounter (HOSPITAL_BASED_OUTPATIENT_CLINIC_OR_DEPARTMENT_OTHER): Payer: PRIVATE HEALTH INSURANCE | Admitting: Internal Medicine

## 2021-04-29 ENCOUNTER — Telehealth: Payer: Self-pay | Admitting: *Deleted

## 2021-04-29 NOTE — Telephone Encounter (Signed)
Called patient about rescheduling appointment from 05/04/21 to 05/05/21 with Judson Roch - Patient confirmed

## 2021-05-04 ENCOUNTER — Inpatient Hospital Stay: Payer: PRIVATE HEALTH INSURANCE

## 2021-05-04 ENCOUNTER — Telehealth: Payer: Self-pay | Admitting: *Deleted

## 2021-05-04 ENCOUNTER — Inpatient Hospital Stay: Payer: PRIVATE HEALTH INSURANCE | Admitting: Family

## 2021-05-04 NOTE — Telephone Encounter (Signed)
Called and lvm of appoint being rescheduled from 05/05/21 to 05/19/21 - request call back to confirm.

## 2021-05-05 ENCOUNTER — Other Ambulatory Visit: Payer: PRIVATE HEALTH INSURANCE

## 2021-05-05 ENCOUNTER — Ambulatory Visit: Payer: PRIVATE HEALTH INSURANCE | Admitting: Family

## 2021-05-09 ENCOUNTER — Ambulatory Visit: Payer: PRIVATE HEALTH INSURANCE | Admitting: Allergy & Immunology

## 2021-05-10 ENCOUNTER — Other Ambulatory Visit: Payer: Self-pay | Admitting: Family Medicine

## 2021-05-10 DIAGNOSIS — G894 Chronic pain syndrome: Secondary | ICD-10-CM

## 2021-05-10 DIAGNOSIS — Z981 Arthrodesis status: Secondary | ICD-10-CM

## 2021-05-10 DIAGNOSIS — M8610 Other acute osteomyelitis, unspecified site: Secondary | ICD-10-CM

## 2021-05-10 MED ORDER — HYDROCODONE-ACETAMINOPHEN 5-325 MG PO TABS: 1.0000 | ORAL_TABLET | Freq: Four times a day (QID) | ORAL | 0 refills | Status: DC | PRN

## 2021-05-10 NOTE — Telephone Encounter (Signed)
Medication: HYDROcodone-acetaminophen (NORCO/VICODIN) 5-325 MG   Has the patient contacted their pharmacy? No. (If no, request that the patient contact the pharmacy for the refill.) (If yes, when and what did the pharmacy advise?)  Preferred Pharmacy (with phone number or street name): [facility] [address] [place], [place] - [address]  [address], [place] [address]  Phone:  [phone]  Fax:  [phone]   Agent: Please be advised that RX refills may take up to 3 business days. We ask that you follow-up with your pharmacy.

## 2021-05-10 NOTE — Telephone Encounter (Signed)
RequestingLebron [name] Contract: 2019 UDS: 2019 Last OV: 03/24/21 Next OV: N/A Last Refill: 04/11/2021, #120--0 RF Database:   Please advise

## 2021-05-18 ENCOUNTER — Other Ambulatory Visit: Payer: Self-pay | Admitting: Family Medicine

## 2021-05-18 ENCOUNTER — Ambulatory Visit: Payer: PRIVATE HEALTH INSURANCE | Admitting: Internal Medicine

## 2021-05-18 DIAGNOSIS — IMO0002 Reserved for concepts with insufficient information to code with codable children: Secondary | ICD-10-CM

## 2021-05-18 DIAGNOSIS — E1165 Type 2 diabetes mellitus with hyperglycemia: Secondary | ICD-10-CM

## 2021-05-19 ENCOUNTER — Inpatient Hospital Stay: Payer: PRIVATE HEALTH INSURANCE | Admitting: Family

## 2021-05-19 ENCOUNTER — Inpatient Hospital Stay: Payer: PRIVATE HEALTH INSURANCE | Attending: Hematology & Oncology

## 2021-05-26 ENCOUNTER — Telehealth: Payer: Self-pay | Admitting: Family Medicine

## 2021-05-26 ENCOUNTER — Other Ambulatory Visit: Payer: Self-pay | Admitting: Family Medicine

## 2021-05-26 MED ORDER — ONDANSETRON HCL 8 MG PO TABS: 8.0000 mg | ORAL_TABLET | Freq: Three times a day (TID) | ORAL | 0 refills | Status: DC | PRN

## 2021-05-26 NOTE — Telephone Encounter (Signed)
Patient would like a refill on nausea meds. States she is unsure of the name of the one she normally gets. She just know it not the chewable one. Please advise if appt is needed

## 2021-05-26 NOTE — Telephone Encounter (Signed)
Don't see Zofran on patient's list. Okay to send?

## 2021-05-27 ENCOUNTER — Encounter: Admit: 2021-05-27 | Discharge: 2021-05-27 | Payer: MEDICARE

## 2021-05-27 DIAGNOSIS — I10 Essential (primary) hypertension: Secondary | ICD-10-CM

## 2021-05-27 DIAGNOSIS — E559 Vitamin D deficiency, unspecified: Secondary | ICD-10-CM

## 2021-05-27 DIAGNOSIS — K5900 Constipation, unspecified: Secondary | ICD-10-CM

## 2021-05-27 DIAGNOSIS — G9050 Complex regional pain syndrome I, unspecified: Secondary | ICD-10-CM

## 2021-05-27 DIAGNOSIS — D509 Iron deficiency anemia, unspecified: Secondary | ICD-10-CM

## 2021-05-27 DIAGNOSIS — E785 Hyperlipidemia, unspecified: Secondary | ICD-10-CM

## 2021-05-27 DIAGNOSIS — D649 Anemia, unspecified: Secondary | ICD-10-CM

## 2021-05-27 DIAGNOSIS — G5600 Carpal tunnel syndrome, unspecified upper limb: Secondary | ICD-10-CM

## 2021-05-27 DIAGNOSIS — M79642 Pain in left hand: Secondary | ICD-10-CM

## 2021-05-27 DIAGNOSIS — E119 Type 2 diabetes mellitus without complications: Secondary | ICD-10-CM

## 2021-05-27 DIAGNOSIS — Z789 Other specified health status: Secondary | ICD-10-CM

## 2021-05-27 DIAGNOSIS — K589 Irritable bowel syndrome without diarrhea: Secondary | ICD-10-CM

## 2021-05-27 DIAGNOSIS — J309 Allergic rhinitis, unspecified: Secondary | ICD-10-CM

## 2021-05-27 DIAGNOSIS — J45909 Unspecified asthma, uncomplicated: Secondary | ICD-10-CM

## 2021-05-27 DIAGNOSIS — K2970 Gastritis, unspecified, without bleeding: Secondary | ICD-10-CM

## 2021-05-27 LAB — CBC AND DIFF
ABSOLUTE BASO COUNT: 0 K/UL (ref 0–0.20)
ABSOLUTE EOS COUNT: 0.1 K/UL (ref 0–0.45)
ABSOLUTE LYMPH COUNT: 1.8 K/UL (ref 1.0–4.8)
ABSOLUTE MONO COUNT: 0.4 K/UL (ref 0–0.80)
ABSOLUTE NEUTROPHIL: 6.8 K/UL (ref 1.8–7.0)
BASOPHILS %: 1 % (ref 0–2)
EOSINOPHILS %: 2 % (ref 0–5)
HEMATOCRIT: 31 % — ABNORMAL LOW (ref 36–45)
HEMOGLOBIN: 10 g/dL — ABNORMAL LOW (ref 12.0–15.0)
LYMPHOCYTES %: 19 % — ABNORMAL LOW (ref 24–44)
MCH: 26 pg — ABNORMAL HIGH (ref 26–34)
MCHC: 33 g/dL — ABNORMAL HIGH (ref 32.0–36.0)
MCV: 81 FL — ABNORMAL HIGH (ref 80–100)
MONOCYTES %: 5 % (ref 4–12)
MPV: 8.4 FL (ref 7–11)
NEUTROPHILS %: 73 % (ref 41–77)
PLATELET COUNT: 315 K/UL — ABNORMAL LOW (ref >60)
RBC COUNT: 3.8 M/UL — ABNORMAL LOW (ref 4.0–5.0)
RDW: 13 % (ref 11–15)
WBC COUNT: 9.3 K/UL — ABNORMAL HIGH (ref 4.5–11.0)

## 2021-05-27 LAB — BASIC METABOLIC PANEL: SODIUM: 134 MMOL/L — ABNORMAL LOW (ref 137–147)
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
## 2021-05-28 ENCOUNTER — Ambulatory Visit: Admit: 2021-05-27 | Discharge: 2021-05-28 | Payer: MEDICARE

## 2021-05-28 DIAGNOSIS — R809 Proteinuria, unspecified: Secondary | ICD-10-CM

## 2021-05-28 DIAGNOSIS — E1129 Type 2 diabetes mellitus with other diabetic kidney complication: Secondary | ICD-10-CM

## 2021-05-28 DIAGNOSIS — N183 Chronic kidney disease, stage 3 unspecified (HCC): Secondary | ICD-10-CM

## 2021-05-30 ENCOUNTER — Encounter: Admit: 2021-05-30 | Discharge: 2021-05-30 | Payer: MEDICARE

## 2021-05-30 DIAGNOSIS — N1832 Stage 3b chronic kidney disease (HCC): Secondary | ICD-10-CM

## 2021-05-30 DIAGNOSIS — R7989 Other specified abnormal findings of blood chemistry: Secondary | ICD-10-CM

## 2021-05-30 NOTE — Telephone Encounter (Signed)
Rx was sent on 05/26/21

## 2021-05-30 NOTE — Telephone Encounter
Call Secily about her lab results -     Her kidney test was worse.  Her Creatinine was 1.66 in April, but was up to 2.49 on her lab work last week.  I want her to have an ultrasound of her kidneys, and also to see a kidney specialist.  We will fax an order for a kidney ultrasound to [facility], and I will place a referral to one of the [facility] kidney specialists.    She needs to avoid all over the counter anti-inflammatory medications, but can take Tylenol as needed.    Also, her A1C was higher at 8.3% (had been 7.7% in April).   She needs to keep working on more exercise, and work on her diet.  Will keep medications the same for now.

## 2021-05-30 NOTE — Telephone Encounter
Pt notified.She has already scheduled with [facility] for the [name] of her kidneys.

## 2021-05-30 NOTE — Telephone Encounter
LMTCB on cell phone.

## 2021-06-09 ENCOUNTER — Other Ambulatory Visit: Payer: Self-pay | Admitting: Family Medicine

## 2021-06-09 DIAGNOSIS — M8610 Other acute osteomyelitis, unspecified site: Secondary | ICD-10-CM

## 2021-06-09 DIAGNOSIS — G894 Chronic pain syndrome: Secondary | ICD-10-CM

## 2021-06-09 DIAGNOSIS — Z981 Arthrodesis status: Secondary | ICD-10-CM

## 2021-06-09 MED ORDER — HYDROCODONE-ACETAMINOPHEN 5-325 MG PO TABS: 1.0000 | ORAL_TABLET | Freq: Four times a day (QID) | ORAL | 0 refills | Status: DC | PRN

## 2021-06-09 NOTE — Telephone Encounter (Signed)
Pt is needing rx for pain meds, sent to [facility] on [address] rx hydrocodone 5-325.

## 2021-06-09 NOTE — Telephone Encounter (Signed)
RequestingLebron Quam Contract: 2019 UDS: 2019 Last OV: 03/24/21 Next OV: N/A Last Refill: 05/10/21, #120, 0 RF Database:   Please advise

## 2021-06-15 ENCOUNTER — Encounter: Admit: 2021-06-15 | Discharge: 2021-06-15 | Payer: MEDICARE

## 2021-06-15 NOTE — Telephone Encounter
Pt calls that she has not heard from nephrology. Pt given contact info for nephrology.

## 2021-06-16 ENCOUNTER — Encounter: Admit: 2021-06-16 | Discharge: 2021-06-16 | Payer: MEDICARE

## 2021-06-17 ENCOUNTER — Encounter: Admit: 2021-06-17 | Discharge: 2021-06-17 | Payer: MEDICARE

## 2021-06-17 NOTE — Telephone Encounter
Call Rhonda Fletcher-    The ultrasound of her kidneys was normal, which is great news.    I will mail her a copy of the report.  I still want her to see the kidney specialist.

## 2021-06-17 NOTE — Telephone Encounter
Pt notified.

## 2021-06-19 ENCOUNTER — Other Ambulatory Visit: Payer: Self-pay | Admitting: Family Medicine

## 2021-06-19 DIAGNOSIS — G894 Chronic pain syndrome: Secondary | ICD-10-CM

## 2021-06-19 DIAGNOSIS — Z981 Arthrodesis status: Secondary | ICD-10-CM

## 2021-06-20 ENCOUNTER — Encounter: Admit: 2021-06-20 | Discharge: 2021-06-20 | Payer: MEDICARE

## 2021-06-20 DIAGNOSIS — R7989 Other specified abnormal findings of blood chemistry: Secondary | ICD-10-CM

## 2021-06-20 DIAGNOSIS — N1832 Stage 3b chronic kidney disease (HCC): Secondary | ICD-10-CM

## 2021-06-20 NOTE — Progress Notes
Pt notified - see telephone call.    The ultrasound of her kidneys was normal, which is great news.    I will mail her a copy of the report.  I still want her to see the kidney specialist.

## 2021-06-22 ENCOUNTER — Ambulatory Visit: Payer: PRIVATE HEALTH INSURANCE | Admitting: Internal Medicine

## 2021-06-22 ENCOUNTER — Encounter: Admit: 2021-06-22 | Discharge: 2021-06-22 | Payer: MEDICARE

## 2021-06-22 MED ORDER — METFORMIN 1,000 MG PO TAB
ORAL_TABLET | Freq: Two times a day (BID) | 1 refills
Start: 2021-06-22 — End: ?

## 2021-06-24 ENCOUNTER — Ambulatory Visit: Payer: PRIVATE HEALTH INSURANCE | Admitting: Family Medicine
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
## 2021-06-27 ENCOUNTER — Ambulatory Visit (INDEPENDENT_AMBULATORY_CARE_PROVIDER_SITE_OTHER): Payer: PRIVATE HEALTH INSURANCE | Admitting: Family Medicine

## 2021-06-27 ENCOUNTER — Other Ambulatory Visit: Payer: Self-pay

## 2021-06-27 ENCOUNTER — Encounter: Payer: Self-pay | Admitting: Family Medicine

## 2021-06-27 VITALS — BP 144/80 | HR 81 | Temp 98.7°F | Resp 20 | Ht 65.0 in

## 2021-06-27 DIAGNOSIS — L299 Pruritus, unspecified: Secondary | ICD-10-CM | POA: Diagnosis not present

## 2021-06-27 DIAGNOSIS — E039 Hypothyroidism, unspecified: Secondary | ICD-10-CM | POA: Diagnosis not present

## 2021-06-27 DIAGNOSIS — L309 Dermatitis, unspecified: Secondary | ICD-10-CM

## 2021-06-27 DIAGNOSIS — L089 Local infection of the skin and subcutaneous tissue, unspecified: Secondary | ICD-10-CM

## 2021-06-27 DIAGNOSIS — E11628 Type 2 diabetes mellitus with other skin complications: Secondary | ICD-10-CM | POA: Diagnosis not present

## 2021-06-27 NOTE — Assessment & Plan Note (Signed)
Check labs  Take antihistamine daily

## 2021-06-27 NOTE — Assessment & Plan Note (Addendum)
Referral placed for ortho Pt will get med records sent

## 2021-06-27 NOTE — Patient Instructions (Signed)
Pruritus Pruritus is an itchy feeling on the skin. One of the most common causes is dry skin, but many different things can cause itching. Most cases of itching do notrequire medical attention. Sometimes itchy skin can turn into a rash. Follow these instructions at home: Skin care  Apply moisturizing lotion to your skin as needed. Lotion that contains petroleum jelly is best. Take medicines or apply medicated creams only as told by your health care provider. This may include: Corticosteroid cream. Anti-itch lotions. Oral antihistamines. Apply a cool, wet cloth (cool compress) to the affected areas. Take baths with one of the following: Epsom salts. You can get these at your local pharmacy or grocery store. Follow the instructions on the packaging. Baking soda. Pour a small amount into the bath as told by your health care provider. Colloidal oatmeal. You can get this at your local pharmacy or grocery store. Follow the instructions on the packaging. Apply baking soda paste to your skin. To make the paste, stir water into a small amount of baking soda until it reaches a paste-like consistency. Do not scratch your skin. Do not take hot showers or baths, which can make itching worse. A cool shower may help with itching as long as you apply moisturizing lotion after the shower. Do not use scented soaps, detergents, perfumes, and cosmetic products. Instead, use gentle, unscented versions of these items.  General instructions Avoid wearing tight clothes. Keep a journal to help find out what is causing your itching. Write down: What you eat and drink. What cosmetic products you use. What soaps or detergents you use. What you wear, including jewelry. Use a humidifier. This keeps the air moist, which helps to prevent dry skin. Be aware of any changes in your itchiness. Contact a health care provider if: The itching does not go away after several days. You are unusually thirsty or urinating more  than normal. Your skin tingles or feels numb. Your skin or the white parts of your eyes turn yellow (jaundice). You feel weak. You have any of the following: Night sweats. Tiredness (fatigue). Weight loss. Abdominal pain. Summary Pruritus is an itchy feeling on the skin. One of the most common causes is dry skin, but many different conditions and factors can cause itching. Apply moisturizing lotion to your skin as needed. Lotion that contains petroleum jelly is best. Take medicines or apply medicated creams only as told by your health care provider. Do not take hot showers or baths. Do not use scented soaps, detergents, perfumes, or cosmetic products. This information is not intended to replace advice given to you by your health care provider. Make sure you discuss any questions you have with your healthcare provider. Document Revised: 10/30/2017 Document Reviewed: 10/30/2017 Elsevier Patient Education  2022 [employer].
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
## 2021-06-28 ENCOUNTER — Telehealth: Payer: Self-pay | Admitting: *Deleted

## 2021-06-28 LAB — COMPREHENSIVE METABOLIC PANEL
ALT: 7 U/L (ref 0–35)
AST: 12 U/L (ref 0–37)
Albumin: 3.1 g/dL — ABNORMAL LOW (ref 3.5–5.2)
Alkaline Phosphatase: 93 U/L (ref 39–117)
BUN: 53 mg/dL — ABNORMAL HIGH (ref 6–23)
CO2: 22 mEq/L (ref 19–32)
Calcium: 8 mg/dL — ABNORMAL LOW (ref 8.4–10.5)
Chloride: 106 mEq/L (ref 96–112)
Creatinine, Ser: 3.5 mg/dL — ABNORMAL HIGH (ref 0.40–1.20)
GFR: 13.15 mL/min — CL (ref >60.00)
Glucose, Bld: 220 mg/dL — ABNORMAL HIGH (ref 70–99)
Potassium: 5.4 mEq/L — ABNORMAL HIGH (ref 3.5–5.1)
Sodium: 135 mEq/L (ref 135–145)
Total Bilirubin: 0.3 mg/dL (ref 0.2–1.2)
Total Protein: 6.9 g/dL (ref 6.0–8.3)

## 2021-06-28 LAB — THYROID PANEL WITH TSH
Free Thyroxine Index: 1.6 (ref 1.4–3.8)
T3 Uptake: 34 % (ref 22–35)
T4, Total: 4.6 ug/dL — ABNORMAL LOW (ref 5.1–11.9)
TSH: 2.01 mIU/L (ref 0.40–4.50)

## 2021-06-28 NOTE — Telephone Encounter (Signed)
Lab faxed

## 2021-06-28 NOTE — Telephone Encounter (Signed)
Per patient request - called and scheduled appointment with Sarah for 07/29/21 @ 8:45 a.m. - Requested call back to confirm - mailed calendar

## 2021-06-28 NOTE — Telephone Encounter (Signed)
CRITICAL VALUE STICKER  CRITICAL VALUE: GFR  13.15  RECEIVER (on-site recipient of call): Kelle Darting, [place] NOTIFIED: 06/28/21 @ 12pm  MESSENGER (representative from lab): Roger Kill  MD NOTIFIED: Carollee Herter  TIME OF NOTIFICATION:12:03pm  RESPONSE:

## 2021-07-01 ENCOUNTER — Other Ambulatory Visit: Payer: Self-pay | Admitting: Nephrology

## 2021-07-01 DIAGNOSIS — N184 Chronic kidney disease, stage 4 (severe): Secondary | ICD-10-CM

## 2021-07-07 ENCOUNTER — Other Ambulatory Visit: Payer: Self-pay | Admitting: Family Medicine

## 2021-07-07 DIAGNOSIS — Z981 Arthrodesis status: Secondary | ICD-10-CM

## 2021-07-07 DIAGNOSIS — G894 Chronic pain syndrome: Secondary | ICD-10-CM

## 2021-07-07 DIAGNOSIS — M8610 Other acute osteomyelitis, unspecified site: Secondary | ICD-10-CM

## 2021-07-07 NOTE — Telephone Encounter (Signed)
Medication: HYDROcodone-acetaminophen (NORCO/VICODIN) 5-325 MG tablet  Has the patient contacted their pharmacy? No. (If no, request that the patient contact the pharmacy for the refill.) (If yes, when and what did the pharmacy advise?)  Preferred Pharmacy (with phone number or street name):  [facility] [address] [place], [place] - [address]  [address] [place] [address]  Phone:  [phone]  Fax:  [phone]  Agent: Please be advised that RX refills may take up to 3 business days. We ask that you follow-up with your pharmacy.

## 2021-07-08 MED ORDER — HYDROCODONE-ACETAMINOPHEN 5-325 MG PO TABS: 1.0000 | ORAL_TABLET | Freq: Four times a day (QID) | ORAL | 0 refills | Status: DC | PRN

## 2021-07-08 NOTE — Telephone Encounter (Signed)
Requesting: Hydrocodone Contract: none UDS: none Last Visit: 06/27/2021 Next Visit: none Last Refill: 06/09/2021  Please Advise

## 2021-07-12 ENCOUNTER — Other Ambulatory Visit: Payer: Self-pay | Admitting: Family Medicine

## 2021-07-12 DIAGNOSIS — T7840XD Allergy, unspecified, subsequent encounter: Secondary | ICD-10-CM

## 2021-07-14 ENCOUNTER — Other Ambulatory Visit: Payer: PRIVATE HEALTH INSURANCE

## 2021-07-22 ENCOUNTER — Other Ambulatory Visit: Payer: Self-pay | Admitting: Family

## 2021-07-22 ENCOUNTER — Ambulatory Visit: Admit: 2021-07-22 | Discharge: 2021-07-22 | Payer: MEDICARE

## 2021-07-22 ENCOUNTER — Encounter: Admit: 2021-07-22 | Discharge: 2021-07-22 | Payer: MEDICARE

## 2021-07-22 DIAGNOSIS — K5900 Constipation, unspecified: Secondary | ICD-10-CM

## 2021-07-22 DIAGNOSIS — R82998 Other abnormal findings in urine: Secondary | ICD-10-CM

## 2021-07-22 DIAGNOSIS — E559 Vitamin D deficiency, unspecified: Secondary | ICD-10-CM

## 2021-07-22 DIAGNOSIS — J45909 Unspecified asthma, uncomplicated: Secondary | ICD-10-CM

## 2021-07-22 DIAGNOSIS — M79642 Pain in left hand: Secondary | ICD-10-CM

## 2021-07-22 DIAGNOSIS — G9050 Complex regional pain syndrome I, unspecified: Secondary | ICD-10-CM

## 2021-07-22 DIAGNOSIS — D649 Anemia, unspecified: Secondary | ICD-10-CM

## 2021-07-22 DIAGNOSIS — N1832 Chronic kidney disease, stage 3b (HCC): Secondary | ICD-10-CM

## 2021-07-22 DIAGNOSIS — E119 Type 2 diabetes mellitus without complications: Secondary | ICD-10-CM

## 2021-07-22 DIAGNOSIS — I10 Essential (primary) hypertension: Secondary | ICD-10-CM

## 2021-07-22 DIAGNOSIS — K589 Irritable bowel syndrome without diarrhea: Secondary | ICD-10-CM

## 2021-07-22 DIAGNOSIS — R7989 Other specified abnormal findings of blood chemistry: Secondary | ICD-10-CM

## 2021-07-22 DIAGNOSIS — N184 Chronic kidney disease, stage 4 (severe): Secondary | ICD-10-CM

## 2021-07-22 DIAGNOSIS — G5600 Carpal tunnel syndrome, unspecified upper limb: Secondary | ICD-10-CM

## 2021-07-22 DIAGNOSIS — J309 Allergic rhinitis, unspecified: Secondary | ICD-10-CM

## 2021-07-22 DIAGNOSIS — K2970 Gastritis, unspecified, without bleeding: Secondary | ICD-10-CM

## 2021-07-22 DIAGNOSIS — E785 Hyperlipidemia, unspecified: Secondary | ICD-10-CM

## 2021-07-22 LAB — HEPATITIS C ANTIBODY W REFLEX HCV PCR QUANT

## 2021-07-22 LAB — URINALYSIS, MICROSCOPIC

## 2021-07-22 LAB — COMPREHENSIVE METABOLIC PANEL
ALBUMIN: 4.6 g/dL (ref 3.5–5.0)
ALK PHOSPHATASE: 80 U/L (ref 25–110)
ALT: 14 U/L (ref 7–56)
ANION GAP: 14 — ABNORMAL HIGH (ref 3–12)
BLD UREA NITROGEN: 33 mg/dL — ABNORMAL HIGH (ref 7–25)
CALCIUM: 9.5 mg/dL (ref 8.5–10.6)
CHLORIDE: 110 MMOL/L (ref 98–110)
CO2: 16 MMOL/L — ABNORMAL LOW (ref 21–30)
CREATININE: 2.6 mg/dL — ABNORMAL HIGH (ref 0.4–1.00)
EGFR: 20 mL/min — ABNORMAL LOW (ref >60)
GLUCOSE,PANEL: 74 mg/dL (ref 70–100)
POTASSIUM: 4.6 MMOL/L — ABNORMAL HIGH (ref 3.5–5.1)
SODIUM: 140 MMOL/L (ref 137–147)
TOTAL BILIRUBIN: 0.4 mg/dL (ref 0.3–1.2)
TOTAL PROTEIN: 7.2 g/dL (ref 6.0–8.0)

## 2021-07-22 LAB — PARATHYROID HORMONE: PTH HORMONE: 110 pg/mL — ABNORMAL HIGH (ref 10–65)

## 2021-07-22 LAB — PROTIME INR (PT)
INR: 0.9 (ref 0.8–1.2)
PROTIME: 9.5 s (ref 9.5–14.2)

## 2021-07-22 LAB — CBC AND DIFF
HEMATOCRIT: 32 % — ABNORMAL LOW (ref 36–45)
MCH: 26 pg (ref 26–34)
RBC COUNT: 4 M/UL (ref 4.0–5.0)
WBC COUNT: 9 K/UL (ref 4.5–11.0)

## 2021-07-22 LAB — 25-OH VITAMIN D (D2 + D3): VITAMIN D (25-OH) TOTAL: 33 ng/mL (ref 30–80)

## 2021-07-22 LAB — C3 COMPLEMENT 3: C3: 98 mg/dL (ref 88–200)

## 2021-07-22 LAB — PHOSPHORUS: PHOSPHORUS: 2.9 mg/dL (ref 2.0–4.5)

## 2021-07-22 LAB — HEPATITIS B SURFACE AG

## 2021-07-22 LAB — C4 COMPLEMENT 4: C4: 62 mg/dL — ABNORMAL HIGH (ref 10–49)

## 2021-07-22 NOTE — Progress Notes
History      CC: Chronic kidney disease    HPI: Rhonda Fletcher is a 65 y.o. female with past medical history of diabetes mellitus, hypertension.    Known diabetic for the past 25 years without documented retinopathy.   Known hypertensive for the past 10 years. She does not assess her blood pressure at home.  Denies taking NSAIDS. Denies history of nephrolithiasis.  Denies new rash or joint swelling. Denies chronic diarrhea. She has no changes in her urine.      Past Medical History   Diabetes mellitus  Hypertension    Family History  No known family history of kidney disease    Social History  Retired since 2003.    Medication    HOME MEDS  ? albuterol sulfate (PROAIR HFA) 90 mcg/actuation HFA aerosol inhaler Inhale two puffs by mouth into the lungs every 6 hours as needed.   ? alcohol swabs (BD SINGLE USE SWABS REGULAR) USE TO CLEAN SKIN BEFORE FINGERSTICK   ? amLODIPine (NORVASC) 10 mg tablet TAKE 1 TABLET EVERY DAY   ? betamethasone valerate (VALISONE) 0.1 % topical cream APPLY  TOPICALLY TO AFFECTED AREA DAILY.   ? betamethasone valerate (VALISONE) 0.1 % topical ointment APPLY  TOPICALLY TO AFFECTED AREA DAILY.   ? Blood Glucose Control, Normal soln Use in glucose meter, Accu-check Aviva   ? blood sugar diagnostic (ACCU-CHEK AVIVA PLUS TEST STRP) test strip Use one strip as directed twice daily before meals. 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 cetirizine (ZYRTEC) 10 mg tablet Take 10 mg by mouth daily.     ? cholecalciferol(+) (VITAMIN D-3) 2,000 unit tablet Take 1 Tab by mouth daily.   ? cloNIDine HCL (CATAPRES) 0.2 mg tablet Take one tablet by mouth three times daily.   ? cyanocobalamin (VITAMIN B-12) 500 mcg tablet Take 500 mcg by mouth daily.   ? cyclobenzaprine (FLEXERIL) 5 mg tablet TAKE 1 TABLET AT BEDTIME   ? famotidine (PEPCID) 20 mg tablet TAKE 1 TABLET TWICE DAILY   ? ferrous sulfate (FEOSOL) 325 mg (65 mg iron) tablet Take 325 mg by mouth every 48 hours.   ? fluticasone propionate (FLONASE) 50 mcg/actuation nasal spray, suspension USE 2 SPRAYS IN EACH NOSTRIL AS DIRECTED DAILY. SHAKE BOTTLE GENTLY BEFORE USING   ? glimepiride (AMARYL) 2 mg tablet Take one tablet by mouth daily.   ? lancets MISC Use one each as directed daily before breakfast. Diag: E11.29   ? metFORMIN (GLUCOPHAGE) 1,000 mg tablet TAKE 1 TABLET TWICE DAILY WITH MEALS   ? pantoprazole DR (PROTONIX) 40 mg tablet TAKE 1 TABLET EVERY DAY          Review of Systems  Constitutional: negative  Eyes: negative  Ears, nose, mouth, throat, and face: negative  Respiratory: negative  Cardiovascular: negative  Gastrointestinal: negative  Genitourinary:negative  Integument/breast: negative  Hematologic/lymphatic: negative  Musculoskeletal:negative  Neurological: negative  Endocrine: negative      Physical Exam        Vitals:    07/22/21 0940 07/22/21 0943   BP: 126/70 118/69   BP Source: Arm, Left Upper Arm, Left Upper   Pulse: 78 83   PainSc: Zero    Weight: 72.1 kg (159 lb)    Height: 166.4 cm (5' 5.5)      Body mass index is 26.06 kg/m?.    Gen: Alert and Oriented, No Acute  Distress   HEENT: Sclera normal; MMM  CV:  S1 and S2 normal, no rubs, murmurs or gallops   Pulm: Clear to Auscultation bilateral   GI: BS+ x4, non-tender to palpation  Neuro: Grossly normal, moving all extremities, speech intact  Ext: no edema, clubbing or cyanosis   Skin: no rash     Assessment and Plan        Rhonda Fletcher is a 65 y.o. female with past medical history of diabetes mellitus and hypertension.      -Chronic kidney disease  The patient has had CKD since at least 2019.  Her baseline serum creatinine has fluctuated between 1.5 to 2 mg/dL.  She had some microalbuminuria on her previous urine test but on the latest from April 2022 she did not have significant microalbuminuria.  Her renal ultrasound from August 2022 did not show any gross lesions in the kidney but the signs of the kidney were on the smaller side.  She has metabolic acidosis and anemia.  Episodes of metabolic acidosis have been present since at least 2019 and was out of proportion to her kidney function.  This suggests a prominent tubulointerstitial process.  Her most recent eye exam did not show evidence of retinopathy.  Together, the absence of significant albuminuria, absence of retinopathy on eye exam, small size kidneys makes diabetes nephropathy less likely in this patient.  For the reasons stated above prominent glomerulopathy is also unlikely.  It is possible that previous episodes of acute tubular injury have progressed to CKD.  I do not think that work-up for GN and renal biopsy would be helpful in guiding management of this patient.However renal biopsy may prove useful during subsequent work-up for kidney transplant in terms of knowing the etiology and predicting recurrence.  I also discussed with the patient that she has advanced kidney disease at this point and I will send her to see NP Harvin Hazel to discuss renal replacement therapies.  Request hepatitis serologies, HIV, complements, urinalysis, UPCR.  I will order a PT/INR in preparation for for kidney biopsy as well.  I reviewed the labs from today and and placed an order for a kidney biopsy.    --Metabolic acidosis: This was present on labs from July 2022  Repeat BMP today and if persistently acidotic will start sodium bicarbonate.      --Hyperkalemia: This was also present on labs from July 2022.  From dietary recall we identified identified sweet potatoes could be a contributor.  I have given the patient a list on low potassium diet.  Follow-up BMP from today.    - Hypertension  As per Dr. Cherrie Distance note she has not tolerated ACE inhibitor or ARB in the past.  Blood pressure control in the clinic is adequate.    - Leukocyturia  Patient is asymptomatic.  Continue to monitor.      - Diabetes mellitus  She has difficulties with her diabetes control and her last A1c was 8.3% from 05/27/2021.  I will reach out to her primary regarding discontinuing metformin on account of advanced kidney disease.    Rhonda Durand, MD      Patient Instructions     Please reduce your potassium intake. Tips include:    ? Avoid salt substitutes: No Salt, Nu Salt, Lite Salt. (These may contain potassium)  ? Avoid sports drinks and energy drinks. (These contain added potassium and sodium)  ? Limit dairy like cow's milk, soy milk, or yogurt to 1 cup (total) daily. Un-enriched  rice milk and almond milk are good options (No potassium or phosphorous on the ingredient list!)  ? Limit foods with potassium in the ingredient list.    *Talk with your dietitian for personalized tips for reducing potassium in your diet.    Choose fruits and vegetables from the ?Low? category and make sure to pay attention to serving sizes.      Low Potassium Fruits High Potassium Fruits   Apple, 1 small, dried, 4 rings  Apple juice, ? cup  Applesauce, ? cup  Blackberries, ? cup  Blueberries, ? cup  Boysenberries, frozen, 1 cup  Cherries, 12, canned, ? cup, dried, ? cup  Cranberries, 1 ? cup, dried, ? cup  Cranberry juice cocktail, ? cup  Dates, dried, 3, fresh, deglet, 3, medjool, 1  Figs, fresh, 2 small  Fruit cocktail, ? cup  Grapefruit, ? medium  Gooseberries, canned, ? cup  Grape juice, ? cup  [place], 5  Lemon/Lime, 1 medium  Lychee, 10  Mango, chunks, ? cup  Nectar: apricot, mango, papaya, peach, pear, ? cup  Peaches, canned or frozen, ? cup  Pear, 1 small, canned, ? cup  Persimmon, 2 medium  Pineapple, ? cup or 1 ring, canned, ? cup  Pineapple juice, ? cup  Plums, canned, 3  Raspberries, fresh, 1 cup Apricots, 4 whole, canned, ? cup, dried, 8 halves  Avocado, ? medium  Banana, 1 extra small  Cantaloupe, cubed, 1 cup  Figs, dried, 2  Gooseberries, fresh, 1 cup  Grapefruit juice, ? cup  Grapefruit sections, ? cup  Grapes, small, 1 cup  Guava, 1 medium  Honeydew, cubed, 1 cup  Kiwi, 1 medium  Mandarin oranges, ? cup  Nectarine, 1 small  Orange, 1 small  Orange juice, ? cup  Papaya, cubed, 1 cup  Peach, fresh, 1 medium  Plums, fresh, 2 small  Pomegranate juice, ? cup  Pomegranate seeds, ? cup  Prune juice, ? cup  Prunes, 3  Raisins, 2 Tablespoons  Raspberries, frozen, ? cup  Starfruit 3 .? long, 2 medium  Strawberries, 1 ? cup  Tangerine, 2 small  Watermelon, cubed, 1 ? cup   Low Potassium Vegetables High Potassium Vegetables   Asparagus, ? cup or 6 small spears  Bamboo shoots, canned, ? cup  Bean sprouts, ? cup  Beans: green,  yellow/wax, ? cup  Beets, canned, ? cup  Bitter melon, ? cup  Broccoli, raw, ? cup  Cactus (Nopales), ? cup  Cabbage, all types raw, red, green, or napa  varieties cooked, ? cup  Carrots, ? cup  Cauliflower, ? cup  Celery, raw, 1 stalk  Chayote ([place]), ? cup  Cilantro, ? cup  Corn, all varieties, ? cup  Cucumber, ? cup  Eggplant, ? cup  Endive, ? cup  Greens: collard, kale, mustard, turnip, ? cup  Jicama, ? cup  Leeks, chopped, ? cup  Lettuce, all types, 1 cup  Mixed vegetables, ? cup  Mushrooms, raw, ? cup  Okra, ? cup  Onions, cooked/raw, ? cup  Peas, green or edible-podded, ? cup  Peppers: green, poblano, red, or jalapeno, ? cup  Radishes, 5 large  Squash, yellow/summer, long squash ([place]) ? cup  Spinach, raw, 1 cup  Swiss chard, raw, 1 cup  Tomatillo, raw, ? cup  Turnips, ? cup  Water chestnuts, ? cup Artichoke, 1 medium  Bamboo shoots, cooked/raw, ? cup  Beans: baked, black, kidney, lima, pinto,  refried, white, ? cup  Beets, cooked/raw, ? cup  Broccoli, cooked, ?  cup  Brussels sprouts, ? cup  *Cassava (yuca), ? cup  Celery, cooked, ? cup  Chinese cabbage ([place]ok [place]oy), cooked, ? cup  Garbanzo beans ([place]peas), ? cup  Greens, beet, ? cup  Kohlrabi, ? cup  Lentils, ? cup  Mushrooms, cooked ? cup  Onions, dehydrated, ? cup  Parsnips, ? cup  Peas: black-eyed, cream, purple hull, split, ? cup  Peppers, chili, ? cup  Plantains, sliced, ? cup  *Potatoes, all types, ? cup  Pumpkin, ? cup  [place], ? cup  Spinach, canned/cooked, ? cup  Squash: winter, acorn, butternut, zucchini, ? cup  Succotash, ? cup  *Sweet potatoes or *yams, ? cup  Swiss chard cooked ? cup  Taro, ? cup  Tomato/vegetable juice, ? cup  Tomato, ? cup  Tomato salsa, sauce, or puree, ? cup

## 2021-07-22 NOTE — Patient Instructions
Please reduce your potassium intake. Tips include:     Avoid salt substitutes: No Salt, Nu Salt, Lite Salt. (These may contain potassium)   Avoid sports drinks and energy drinks. (These contain added potassium and sodium)   Limit dairy like cow's milk, soy milk, or yogurt to 1 cup (total) daily. Un-enriched rice milk and almond milk are good options (No potassium or phosphorous on the ingredient list!)   Limit foods with potassium in the ingredient list.    *Talk with your dietitian for personalized tips for reducing potassium in your diet.    Choose fruits and vegetables from the ?Low? category and make sure to pay attention to serving sizes.      Low Potassium Fruits High Potassium Fruits   Apple, 1 small, dried, 4 rings  Apple juice, ? cup  Applesauce, ? cup  Blackberries, ? cup  Blueberries, ? cup  Boysenberries, frozen, 1 cup  Cherries, 12, canned, ? cup, dried, ? cup  Cranberries, 1 ? cup, dried, ? cup  Cranberry juice cocktail, ? cup  Dates, dried, 3, fresh, deglet, 3, medjool, 1  Figs, fresh, 2 small  Fruit cocktail, ? cup  Grapefruit, ? medium  Gooseberries, canned, ? cup  Grape juice, ? cup  [place], 5  Lemon/Lime, 1 medium  Lychee, 10  Mango, chunks, ? cup  Nectar: apricot, mango, papaya, peach, pear, ? cup  Peaches, canned or frozen, ? cup  Pear, 1 small, canned, ? cup  Persimmon, 2 medium  Pineapple, ? cup or 1 ring, canned, ? cup  Pineapple juice, ? cup  Plums, canned, 3  Raspberries, fresh, 1 cup Apricots, 4 whole, canned, ? cup, dried, 8 halves  Avocado, ? medium  Banana, 1 extra small  Cantaloupe, cubed, 1 cup  Figs, dried, 2  Gooseberries, fresh, 1 cup  Grapefruit juice, ? cup  Grapefruit sections, ? cup  Grapes, small, 1 cup  Guava, 1 medium  Honeydew, cubed, 1 cup  Kiwi, 1 medium  Mandarin oranges, ? cup  Nectarine, 1 small  Orange, 1 small  Orange juice, ? cup  Papaya, cubed, 1 cup  Peach, fresh, 1 medium  Plums, fresh, 2 small  Pomegranate juice, ? cup  Pomegranate seeds, ? cup  Prune juice, ? cup  Prunes, 3  Raisins, 2 Tablespoons  Raspberries, frozen, ? cup  Starfruit 3 .? long, 2 medium  Strawberries, 1 ? cup  Tangerine, 2 small  Watermelon, cubed, 1 ? cup   Low Potassium Vegetables High Potassium Vegetables   Asparagus, ? cup or 6 small spears  Bamboo shoots, canned, ? cup  Bean sprouts, ? cup  Beans: green,  yellow/wax, ? cup  Beets, canned, ? cup  Bitter melon, ? cup  Broccoli, raw, ? cup  Cactus (Nopales), ? cup  Cabbage, all types raw, red, green, or napa  varieties cooked, ? cup  Carrots, ? cup  Cauliflower, ? cup  Celery, raw, 1 stalk  Chayote ([place]), ? cup  Cilantro, ? cup  Corn, all varieties, ? cup  Cucumber, ? cup  Eggplant, ? cup  Endive, ? cup  Greens: collard, kale, mustard, turnip, ? cup  Jicama, ? cup  Leeks, chopped, ? cup  Lettuce, all types, 1 cup  Mixed vegetables, ? cup  Mushrooms, raw, ? cup  Okra, ? cup  Onions, cooked/raw, ? cup  Peas, green or edible-podded, ? cup  Peppers: green, poblano, red, or jalapeno, ? cup  Radishes, 5 large  Squash, yellow/summer, long squash ([place]) ?  cup  Spinach, raw, 1 cup  Swiss chard, raw, 1 cup  Tomatillo, raw, ? cup  Turnips, ? cup  Water chestnuts, ? cup Artichoke, 1 medium  Bamboo shoots, cooked/raw, ? cup  Beans: baked, black, kidney, lima, pinto,  refried, white, ? cup  Beets, cooked/raw, ? cup  Broccoli, cooked, ? cup  Brussels sprouts, ? cup  *Cassava (yuca), ? cup  Celery, cooked, ? cup  Chinese cabbage ([place]ok [place]oy), cooked, ? cup  Garbanzo beans ([place]peas), ? cup  Greens, beet, ? cup  Kohlrabi, ? cup  Lentils, ? cup  Mushrooms, cooked ? cup  Onions, dehydrated, ? cup  Parsnips, ? cup  Peas: black-eyed, cream, purple hull, split, ? cup  Peppers, chili, ? cup  Plantains, sliced, ? cup  *Potatoes, all types, ? cup  Pumpkin, ? cup  [place], ? cup  Spinach, canned/cooked, ? cup  Squash: winter, acorn, butternut, zucchini, ? cup  Succotash, ? cup  *Sweet potatoes or *yams, ? cup  Swiss chard cooked ? cup  Taro, ? cup  Tomato/vegetable juice, ? cup  Tomato, ? cup  Tomato salsa, sauce, or puree, ? cup

## 2021-07-26 ENCOUNTER — Telehealth: Payer: Self-pay

## 2021-07-26 NOTE — Telephone Encounter (Signed)
Patient called asking if she was supposed to get iron this Friday when she comes in to see Judson Roch. Called patient back and informed her that her Iron study from her kidney specialist looked good but she will be getting a new injection Judson Roch will be discussing with her when she comes in. Patient agreed and declines any further questions at this time.

## 2021-07-27 ENCOUNTER — Encounter: Admit: 2021-07-27 | Discharge: 2021-07-27 | Payer: MEDICARE

## 2021-07-27 MED ORDER — SODIUM BICARBONATE 650 MG PO TAB
1300 mg | ORAL_TABLET | Freq: Three times a day (TID) | ORAL | 3 refills | 30.00000 days | Status: AC
Start: 2021-07-27 — End: ?

## 2021-07-27 NOTE — Telephone Encounter
I called and spoke to the patient regarding her recent labs which showed eGFR of 17m/min.  I discussed with her the benefits of a kidney biopsy at this point which would be to know for sure what caused the kidney dysfunction but would be unlikely to influence her therapy at this point.  I discussed with her that  this becomes relevant during planning for transplant.  We also discussed the risk associated with kidney biopsy.  The patient has elected to forego a kidney biopsy at this point.  She has persistent metabolic acidosis.  Have ordered sodium bicarbonate 1300 mg 3 times daily.  Patient to follow-up with NP TCarmell Austriato discuss renal replacement therapies.  Next appointment with me on 10/19/2021.    EJulious Payer MD

## 2021-07-28 ENCOUNTER — Other Ambulatory Visit: Payer: PRIVATE HEALTH INSURANCE

## 2021-07-28 ENCOUNTER — Encounter: Admit: 2021-07-28 | Discharge: 2021-07-28 | Payer: MEDICARE

## 2021-07-28 MED ORDER — PANTOPRAZOLE 40 MG PO TBEC
ORAL_TABLET | Freq: Every day | ORAL | 1 refills | 90.00000 days | Status: AC
Start: 2021-07-28 — End: ?

## 2021-07-29 ENCOUNTER — Encounter: Payer: Self-pay | Admitting: Family

## 2021-07-29 ENCOUNTER — Other Ambulatory Visit: Payer: Self-pay

## 2021-07-29 ENCOUNTER — Inpatient Hospital Stay (HOSPITAL_BASED_OUTPATIENT_CLINIC_OR_DEPARTMENT_OTHER): Payer: PRIVATE HEALTH INSURANCE | Admitting: Family

## 2021-07-29 ENCOUNTER — Inpatient Hospital Stay: Payer: PRIVATE HEALTH INSURANCE

## 2021-07-29 ENCOUNTER — Inpatient Hospital Stay: Payer: PRIVATE HEALTH INSURANCE | Attending: Hematology & Oncology

## 2021-07-29 ENCOUNTER — Telehealth: Payer: Self-pay | Admitting: *Deleted

## 2021-07-29 VITALS — BP 140/61 | HR 73 | Temp 98.8°F | Resp 19 | Wt 162.0 lb

## 2021-07-29 DIAGNOSIS — E7211 Homocystinuria: Secondary | ICD-10-CM

## 2021-07-29 DIAGNOSIS — Z86718 Personal history of other venous thrombosis and embolism: Secondary | ICD-10-CM | POA: Diagnosis not present

## 2021-07-29 DIAGNOSIS — I82503 Chronic embolism and thrombosis of unspecified deep veins of lower extremity, bilateral: Secondary | ICD-10-CM

## 2021-07-29 DIAGNOSIS — D500 Iron deficiency anemia secondary to blood loss (chronic): Secondary | ICD-10-CM | POA: Diagnosis not present

## 2021-07-29 DIAGNOSIS — D508 Other iron deficiency anemias: Secondary | ICD-10-CM

## 2021-07-29 DIAGNOSIS — D6859 Other primary thrombophilia: Secondary | ICD-10-CM

## 2021-07-29 DIAGNOSIS — Z7901 Long term (current) use of anticoagulants: Secondary | ICD-10-CM | POA: Insufficient documentation

## 2021-07-29 DIAGNOSIS — D509 Iron deficiency anemia, unspecified: Secondary | ICD-10-CM | POA: Diagnosis not present

## 2021-07-29 DIAGNOSIS — R7983 Abnormal findings of blood amino-acid level: Secondary | ICD-10-CM

## 2021-07-29 DIAGNOSIS — N183 Chronic kidney disease, stage 3 unspecified: Secondary | ICD-10-CM

## 2021-07-29 DIAGNOSIS — D631 Anemia in chronic kidney disease: Secondary | ICD-10-CM | POA: Diagnosis present

## 2021-07-29 DIAGNOSIS — Z79899 Other long term (current) drug therapy: Secondary | ICD-10-CM | POA: Insufficient documentation

## 2021-07-29 LAB — CBC WITH DIFFERENTIAL (CANCER CENTER ONLY)
Abs Immature Granulocytes: 0.1 10*3/uL — ABNORMAL HIGH (ref 0.00–0.07)
Basophils Absolute: 0.1 10*3/uL (ref 0.0–0.1)
Basophils Relative: 1 %
Eosinophils Absolute: 0.3 10*3/uL (ref 0.0–0.5)
Eosinophils Relative: 4 %
HCT: 27.7 % — ABNORMAL LOW (ref 36.0–46.0)
Hemoglobin: 8.7 g/dL — ABNORMAL LOW (ref 12.0–15.0)
Immature Granulocytes: 2 %
Lymphocytes Relative: 21 %
Lymphs Abs: 1.4 10*3/uL (ref 0.7–4.0)
MCH: 27.4 pg (ref 26.0–34.0)
MCHC: 31.4 g/dL (ref 30.0–36.0)
MCV: 87.4 fL (ref 80.0–100.0)
Monocytes Absolute: 0.6 10*3/uL (ref 0.1–1.0)
Monocytes Relative: 9 %
Neutro Abs: 4.3 10*3/uL (ref 1.7–7.7)
Neutrophils Relative %: 63 %
Platelet Count: 196 10*3/uL (ref 150–400)
RBC: 3.17 MIL/uL — ABNORMAL LOW (ref 3.87–5.11)
RDW: 14.1 % (ref 11.5–15.5)
WBC Count: 6.8 10*3/uL (ref 4.0–10.5)
nRBC: 0 % (ref 0.0–0.2)

## 2021-07-29 LAB — CMP (CANCER CENTER ONLY)
ALT: 7 U/L (ref 0–44)
AST: 11 U/L — ABNORMAL LOW (ref 15–41)
Albumin: 3.5 g/dL (ref 3.5–5.0)
Alkaline Phosphatase: 109 U/L (ref 38–126)
Anion gap: 7 (ref 5–15)
BUN: 54 mg/dL — ABNORMAL HIGH (ref 8–23)
CO2: 23 mmol/L (ref 22–32)
Calcium: 9.1 mg/dL (ref 8.9–10.3)
Chloride: 106 mmol/L (ref 98–111)
Creatinine: 3.11 mg/dL (ref 0.44–1.00)
GFR, Estimated: 16 mL/min — ABNORMAL LOW (ref >60)
Glucose, Bld: 125 mg/dL — ABNORMAL HIGH (ref 70–99)
Potassium: 5.2 mmol/L — ABNORMAL HIGH (ref 3.5–5.1)
Sodium: 136 mmol/L (ref 135–145)
Total Bilirubin: 0.3 mg/dL (ref 0.3–1.2)
Total Protein: 7.5 g/dL (ref 6.5–8.1)

## 2021-07-29 LAB — IRON AND TIBC
Iron: 40 ug/dL — ABNORMAL LOW (ref 41–142)
Saturation Ratios: 16 % — ABNORMAL LOW (ref 21–57)
TIBC: 243 ug/dL (ref 236–444)
UIBC: 203 ug/dL (ref 120–384)

## 2021-07-29 LAB — RETICULOCYTES
Immature Retic Fract: 14.2 % (ref 2.3–15.9)
RBC.: 3.15 MIL/uL — ABNORMAL LOW (ref 3.87–5.11)
Retic Count, Absolute: 66.5 10*3/uL (ref 19.0–186.0)
Retic Ct Pct: 2.1 % (ref 0.4–3.1)

## 2021-07-29 LAB — D-DIMER, QUANTITATIVE: D-Dimer, Quant: 1.5 ug/mL-FEU — ABNORMAL HIGH (ref 0.00–0.50)

## 2021-07-29 LAB — FERRITIN: Ferritin: 414 ng/mL — ABNORMAL HIGH (ref 11–307)

## 2021-07-29 MED ORDER — EPOETIN ALFA-EPBX 40000 UNIT/ML IJ SOLN
40000.0000 [IU] | Freq: Once | INTRAMUSCULAR | Status: AC
  Administered 2021-07-29: 40000 [IU] via SUBCUTANEOUS
  Filled 2021-07-29: qty 1

## 2021-07-29 NOTE — Telephone Encounter (Signed)
Jory Ee NP notified of creat-3.11.  No new orders received at this time.

## 2021-07-29 NOTE — Progress Notes (Signed)
Hematology and Oncology Follow Up Visit  [name] [name] [id number] 10-Sep-1956 65 y.o. 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 Hgb is 8.7. Her nephrologist reached out to our office last week to discuss starting her on injections to help with her persistent anemia.   She is symptomatic with fatigue, weakness and SOB with exertion.  She has not noted any blood loss. No abnormal bruising, no petechiae.  She denies fever, chills, cough, rash, dizziness, palpitations, abdominal pain or changes in bowel or bladder habits.  She has episodes of nausea and states she recently started an antiemetic which has been effective.  She goes to the wound care clinic once every 2 weeks to have the ulcers on her feet assessed and re bandaged.  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  No falls or syncope to report. She has been eating well and doing her best to stay well hydrated. Her weight is stable at 205 lbs.   ECOG Performance Status: 2 - Symptomatic, <50% confined to bed  Medications:  Allergies as of 07/29/2021       Reactions   Bee Pollen Anaphylaxis   Fish-derived Products Hives, Shortness Of Breath, Swelling, Rash   Hives get in throat causing trouble breathing   Mushroom Extract Complex Anaphylaxis   Penicillins Anaphylaxis   **Tolerated cefepime March 2021 Did it involve swelling of the face/tongue/throat, SOB, or low BP? Yes Did it involve sudden or severe rash/hives, skin peeling, or any reaction on the inside of your mouth or nose? No Did you need to seek medical  attention at a hospital or doctor's office? Yes When did it last happen?  A few months ago If all above answers are "NO", may proceed with cephalosporin use.   Rosemary Oil Anaphylaxis   Shellfish Allergy Hives, Shortness Of Breath, Swelling, Rash   Tomato Hives, Shortness Of Breath   Hives in throat causes her trouble breathing   Acetaminophen Other (See Comments)   GI upset   Aloe Vera Hives   Broccoli [brassica Oleracea] Hives   Acyclovir And Related Other (See Comments)   Unknown reaction   Naproxen Other (See Comments)   Unknown reaction        Medication List        Accurate as of July 29, 2021  8:21 AM. If you have any questions, ask your nurse or doctor.          acetaminophen 325 MG tablet Commonly known as: TYLENOL Take 2 tablets (650 mg total) by mouth every 6 (six) hours as needed for fever.   atorvastatin 10 MG tablet Commonly known as: LIPITOR Take 1 tablet by mouth once daily   azelastine 0.05 % ophthalmic solution Commonly known as: OPTIVAR Place 1 drop into both eyes 2 (two) times daily.   azelastine 0.1 % nasal spray Commonly known as: ASTELIN Place 1 spray into both nostrils 2 (two) times daily. Use in each nostril as directed   Ciclopirox 1 % shampoo Massage in scalp and let sit 3 min and rinse 2  x weekly   ciprofloxacin 500 MG tablet Commonly known as: CIPRO Take 500 mg by mouth daily.   diazepam 5 MG tablet Commonly known as: VALIUM Take 1 tablet (5 mg total) by mouth every 12 (twelve) hours as needed for anxiety.   DULoxetine 60 MG capsule Commonly known as: CYMBALTA Take 1 capsule by mouth once daily   fluticasone 50 MCG/ACT nasal spray Commonly known as: FLONASE Place 2 sprays into both nostrils daily.   folic acid 1 MG tablet Commonly known as: FOLVITE Take 2 tablets (2 mg total) by mouth daily.   FreeStyle Libre 14 Day Reader Kerrin Mo 1 Device by Does not apply route daily.   FreeStyle Libre 14 Day Sensor Misc 1  Device by Does not apply route daily.   furosemide 20 MG tablet Commonly known as: LASIX Take 20 mg by mouth daily.   HumaLOG KwikPen 200 UNIT/ML KwikPen Generic drug: insulin lispro 15 units sub-q twice daily   HYDROcodone-acetaminophen 5-325 MG tablet Commonly known as: NORCO/VICODIN Take 1 tablet by mouth every 6 (six) hours as needed for moderate pain.   Lantus SoloStar 100 UNIT/ML Solostar Pen Generic drug: insulin glargine INJECT 20 UNITS SUBCUTANEOUSLY IN THE MORNING AND 40 AT NIGHT   lidocaine 5 % Commonly known as: Lidoderm Place 1 patch onto the skin daily. Remove & Discard patch within 12 hours or as directed by MD   montelukast 10 MG tablet Commonly known as: SINGULAIR TAKE 1 TABLET BY MOUTH AT BEDTIME   multivitamin Chew chewable tablet Chew 1 tablet by mouth daily.   multivitamin with minerals Tabs tablet Take 1 tablet by mouth daily.   naftifine 1 % cream Commonly known as: NAFTIN Apply topically daily.   nystatin powder Generic drug: nystatin APPLY  POWDER TOPICALLY THREE TIMES DAILY   ondansetron 8 MG tablet Commonly known as: Zofran Take 1 tablet (8 mg total) by mouth every 8 (eight) hours as needed for nausea or vomiting.   pregabalin 75 MG capsule Commonly known as: LYRICA TAKE 1 CAPSULE BY MOUTH THREE TIMES DAILY   Xarelto 2.5 MG Tabs tablet Generic drug: rivaroxaban Take by mouth.        Allergies:  Allergies  Allergen Reactions   Bee Pollen Anaphylaxis   Fish-Derived Products Hives, Shortness Of Breath, Swelling and Rash    Hives get in throat causing trouble breathing   Mushroom Extract Complex Anaphylaxis   Penicillins Anaphylaxis    **Tolerated cefepime March 2021 Did it involve swelling of the face/tongue/throat, SOB, or low BP? Yes Did it involve sudden or severe rash/hives, skin peeling, or any reaction on the inside of your mouth or nose? No Did you need to seek medical attention at a hospital or doctor's office?  Yes When did it last happen?  A few months ago If all above answers are "NO", may proceed with cephalosporin use.   Rosemary Oil Anaphylaxis   Shellfish Allergy Hives, Shortness Of Breath, Swelling and Rash   Tomato Hives and Shortness Of Breath    Hives in throat causes her trouble breathing   Acetaminophen Other (See Comments)    GI upset   Aloe Vera Hives   Broccoli [Brassica Oleracea] Hives   Acyclovir And Related Other (See Comments)    Unknown reaction   Naproxen Other (See Comments)    Unknown reaction    Past Medical History, Surgical history, Social history, and Family History were reviewed and updated.  Review of Systems: All other 10 point review of systems  is negative.   Physical Exam:  vitals were not taken for this visit.   Wt Readings from Last 3 Encounters:  01/25/21 165 lb (74.8 kg)  11/23/20 169 lb (76.7 kg)  11/23/20 169 lb (76.7 kg)    Ocular: Sclerae unicteric, pupils equal, round and reactive to light Ear-nose-throat: Oropharynx clear, dentition fair Lymphatic: No cervical or supraclavicular adenopathy Lungs no rales or rhonchi, good excursion bilaterally Heart regular rate and rhythm, no murmur appreciated Abd soft, nontender, positive bowel sounds MSK no focal spinal tenderness, no joint edema Neuro: non-focal, well-oriented, appropriate affect Breasts: Deferred   Lab Results  Component Value Date   WBC 6.8 07/29/2021   HGB 8.7 (L) 07/29/2021   HCT 27.7 (L) 07/29/2021   MCV 87.4 07/29/2021   PLT 196 07/29/2021   Lab Results  Component Value Date   FERRITIN 475 (H) 03/25/2021   IRON 55 03/25/2021   TIBC 243 03/25/2021   UIBC 188 03/25/2021   IRONPCTSAT 23 03/25/2021   Lab Results  Component Value Date   RETICCTPCT 2.1 07/29/2021   RBC 3.15 (L) 07/29/2021   RBC 3.17 (L) 07/29/2021   No results found for: KPAFRELGTCHN, LAMBDASER, KAPLAMBRATIO No results found for: IGGSERUM, IGA, IGMSERUM No results found for: Odetta Pink, SPEI   Chemistry      Component Value Date/Time   NA 135 06/27/2021 1609   K 5.4 (H) 06/27/2021 1609   CL 106 06/27/2021 1609   CO2 22 06/27/2021 1609   BUN 53 (H) 06/27/2021 1609   CREATININE 3.50 (H) 06/27/2021 1609   CREATININE 2.95 (H) 03/25/2021 1332   CREATININE 1.97 (H) 06/24/2020 1431      Component Value Date/Time   CALCIUM 8.0 (L) 06/27/2021 1609   ALKPHOS 93 06/27/2021 1609   AST 12 06/27/2021 1609   AST 13 (L) 03/25/2021 1332   ALT 7 06/27/2021 1609   ALT 8 03/25/2021 1332   BILITOT 0.3 06/27/2021 1609   BILITOT 0.4 03/25/2021 1332       Impression and Plan: [name] [name] is a very pleasant 65 yo caucasian female with long standing history of iron deficiency anemia as well as history of DVT's in both the right and left peroneal and posterior tibial veins while she was bed bound in the hospital being treated for osteomyelitis.  She had an elevated homocystine level, mildly elevated anticardiolipin antibody IgG level and elevated D-dimer at that time as well as on repeat lab work.  She is doing well on Xarelto and will continue her same regimen.  She received Retacrit today for Hgb 8.7.  Iron studies are pending. We will replace next week if needed.  Lab and injection again in 3 weeks, follow-up in 6 weeks.  She can contact our office with any questions or concerns.   Lottie Dawson, NP 9/30/20228:21 AM

## 2021-07-29 NOTE — Patient Instructions (Signed)
Epoetin Alfa injection What is this medication? EPOETIN ALFA (e POE e tin AL fa) helps your body make more red blood cells. This medicine is used to treat anemia caused by chronic kidney disease, cancer chemotherapy, or HIV-therapy. It may also be used before surgery if you have anemia. This medicine may be used for other purposes; ask your health care provider or pharmacist if you have questions. COMMON BRAND NAME(S): Epogen, Procrit, Retacrit What should I tell my care team before I take this medication? They need to know if you have any of these conditions: cancer heart disease high blood pressure history of blood clots history of stroke low levels of folate, iron, or vitamin B12 in the blood seizures an unusual or allergic reaction to erythropoietin, albumin, benzyl alcohol, hamster proteins, other medicines, foods, dyes, or preservatives pregnant or trying to get pregnant breast-feeding How should I use this medication? This medicine is for injection into a vein or under the skin. It is usually given by a health care professional in a hospital or clinic setting. If you get this medicine at home, you will be taught how to prepare and give this medicine. Use exactly as directed. Take your medicine at regular intervals. Do not take your medicine more often than directed. It is important that you put your used needles and syringes in a special sharps container. Do not put them in a trash can. If you do not have a sharps container, call your pharmacist or healthcare provider to get one. A special MedGuide will be given to you by the pharmacist with each prescription and refill. Be sure to read this information carefully each time. Talk to your pediatrician regarding the use of this medicine in children. While this drug may be prescribed for selected conditions, precautions do apply. Overdosage: If you think you have taken too much of this medicine contact a poison control center or emergency  room at once. NOTE: This medicine is only for you. Do not share this medicine with others. What if I miss a dose? If you miss a dose, take it as soon as you can. If it is almost time for your next dose, take only that dose. Do not take double or extra doses. What may interact with this medication? Interactions have not been studied. This list may not describe all possible interactions. Give your health care provider a list of all the medicines, herbs, non-prescription drugs, or dietary supplements you use. Also tell them if you smoke, drink alcohol, or use illegal drugs. Some items may interact with your medicine. What should I watch for while using this medication? Your condition will be monitored carefully while you are receiving this medicine. You may need blood work done while you are taking this medicine. This medicine may cause a decrease in vitamin B6. You should make sure that you get enough vitamin B6 while you are taking this medicine. Discuss the foods you eat and the vitamins you take with your health care professional. What side effects may I notice from receiving this medication? Side effects that you should report to your doctor or health care professional as soon as possible: allergic reactions like skin rash, itching or hives, swelling of the face, lips, or tongue seizures signs and symptoms of a blood clot such as breathing problems; changes in vision; chest pain; severe, sudden headache; pain, swelling, warmth in the leg; trouble speaking; sudden numbness or weakness of the face, arm or leg signs and symptoms of a stroke like   changes in vision; confusion; trouble speaking or understanding; severe headaches; sudden numbness or weakness of the face, arm or leg; trouble walking; dizziness; loss of balance or coordination Side effects that usually do not require medical attention (report to your doctor or health care professional if they continue or are  bothersome): chills cough dizziness fever headaches joint pain muscle cramps muscle pain nausea, vomiting pain, redness, or irritation at site where injected This list may not describe all possible side effects. Call your doctor for medical advice about side effects. You may report side effects to FDA at 1-800-FDA-1088. Where should I keep my medication? Keep out of the reach of children. Store in a refrigerator between 2 and 8 degrees C (36 and 46 degrees F). Do not freeze or shake. Throw away any unused portion if using a single-dose vial. Multi-dose vials can be kept in the refrigerator for up to 21 days after the initial dose. Throw away unused medicine. NOTE: This sheet is a summary. It may not cover all possible information. If you have questions about this medicine, talk to your doctor, pharmacist, or health care provider.  2022 Elsevier/Gold Standard (2017-05-25 08:35:19)  

## 2021-07-30 LAB — HOMOCYSTEINE: Homocysteine: 19.5 umol/L — ABNORMAL HIGH (ref 0.0–17.2)

## 2021-08-01 ENCOUNTER — Ambulatory Visit: Payer: PRIVATE HEALTH INSURANCE | Admitting: Allergy & Immunology

## 2021-08-01 LAB — CARDIOLIPIN ANTIBODIES, IGG, IGM, IGA
Anticardiolipin IgA: <9 APL U/mL (ref 0–11)
Anticardiolipin IgG: <9 GPL U/mL (ref 0–14)
Anticardiolipin IgM: <9 MPL U/mL (ref 0–12)

## 2021-08-03 ENCOUNTER — Encounter: Admit: 2021-08-03 | Discharge: 2021-08-03 | Payer: MEDICARE

## 2021-08-03 DIAGNOSIS — E1129 Type 2 diabetes mellitus with other diabetic kidney complication: Secondary | ICD-10-CM

## 2021-08-04 ENCOUNTER — Ambulatory Visit
Admission: RE | Admit: 2021-08-04 | Discharge: 2021-08-04 | Disposition: A | Discharge status: Home / Self Care | Payer: PRIVATE HEALTH INSURANCE | Source: Ambulatory Visit | Attending: Nephrology | Admitting: Nephrology

## 2021-08-04 DIAGNOSIS — N184 Chronic kidney disease, stage 4 (severe): Secondary | ICD-10-CM

## 2021-08-05 ENCOUNTER — Inpatient Hospital Stay: Payer: PRIVATE HEALTH INSURANCE

## 2021-08-08 ENCOUNTER — Other Ambulatory Visit: Payer: Self-pay

## 2021-08-08 ENCOUNTER — Inpatient Hospital Stay: Payer: PRIVATE HEALTH INSURANCE | Attending: Hematology & Oncology

## 2021-08-08 ENCOUNTER — Other Ambulatory Visit: Payer: Self-pay | Admitting: Family Medicine

## 2021-08-08 VITALS — BP 127/42 | HR 68 | Temp 98.8°F | Resp 17

## 2021-08-08 DIAGNOSIS — D631 Anemia in chronic kidney disease: Secondary | ICD-10-CM | POA: Insufficient documentation

## 2021-08-08 DIAGNOSIS — M8610 Other acute osteomyelitis, unspecified site: Secondary | ICD-10-CM

## 2021-08-08 DIAGNOSIS — N183 Chronic kidney disease, stage 3 unspecified: Secondary | ICD-10-CM | POA: Insufficient documentation

## 2021-08-08 DIAGNOSIS — D508 Other iron deficiency anemias: Secondary | ICD-10-CM

## 2021-08-08 DIAGNOSIS — Z981 Arthrodesis status: Secondary | ICD-10-CM

## 2021-08-08 DIAGNOSIS — G894 Chronic pain syndrome: Secondary | ICD-10-CM

## 2021-08-08 DIAGNOSIS — D509 Iron deficiency anemia, unspecified: Secondary | ICD-10-CM | POA: Insufficient documentation

## 2021-08-08 MED ORDER — SODIUM CHLORIDE 0.9 % IV SOLN
200.0000 mg | Freq: Once | INTRAVENOUS | Status: AC
  Administered 2021-08-08: 200 mg via INTRAVENOUS
  Filled 2021-08-08: qty 200

## 2021-08-08 MED ORDER — SODIUM CHLORIDE 0.9% FLUSH: 10.0000 mL | Freq: Once | INTRAVENOUS | Status: DC | PRN

## 2021-08-08 MED ORDER — HYDROCODONE-ACETAMINOPHEN 5-325 MG PO TABS: 1.0000 | ORAL_TABLET | Freq: Four times a day (QID) | ORAL | 0 refills | Status: DC | PRN

## 2021-08-08 MED ORDER — SODIUM CHLORIDE 0.9% FLUSH: 3.0000 mL | Freq: Once | INTRAVENOUS | Status: DC | PRN

## 2021-08-08 MED ORDER — SODIUM CHLORIDE 0.9 % IV SOLN: Freq: Once | INTRAVENOUS | Status: AC

## 2021-08-08 NOTE — Telephone Encounter (Signed)
Medication: HYDROcodone-acetaminophen (NORCO/VICODIN) 5-325 MG tablet [[id number]]     Has the patient contacted their pharmacy? No. (If no, request that the patient contact the pharmacy for the refill.) (If yes, when and what did the pharmacy advise?)    Preferred Pharmacy (with phone number or street name): [facility] [address]     Agent: Please be advised that RX refills may take up to 3 business days. We ask that you follow-up with your pharmacy.

## 2021-08-08 NOTE — Telephone Encounter (Signed)
RequestingLebron Quam Contract: 2019 UDS: 2019 Last OV: 06/27/21 Next OV: N/A Last Refill: 07/08/2021, #120-0 RF Database:   Please advise

## 2021-08-08 NOTE — Patient Instructions (Signed)

## 2021-08-09 ENCOUNTER — Encounter: Admit: 2021-08-09 | Discharge: 2021-08-09 | Payer: MEDICARE

## 2021-08-09 NOTE — Telephone Encounter
Rec'd call from pt. Having problems with the sodium bicarb. On it for 2 weeks before she started to develop "burning in my bowels" and feeling upset stomach/nauseated. Routing to Dr. Kirby Funk to advise.

## 2021-08-12 ENCOUNTER — Inpatient Hospital Stay: Payer: PRIVATE HEALTH INSURANCE

## 2021-08-15 ENCOUNTER — Inpatient Hospital Stay: Payer: PRIVATE HEALTH INSURANCE

## 2021-08-15 ENCOUNTER — Other Ambulatory Visit: Payer: Self-pay

## 2021-08-15 VITALS — BP 188/76 | HR 79 | Temp 98.8°F | Resp 17

## 2021-08-15 DIAGNOSIS — D509 Iron deficiency anemia, unspecified: Secondary | ICD-10-CM | POA: Diagnosis not present

## 2021-08-15 DIAGNOSIS — D508 Other iron deficiency anemias: Secondary | ICD-10-CM

## 2021-08-15 MED ORDER — SODIUM CHLORIDE 0.9 % IV SOLN: Freq: Once | INTRAVENOUS | Status: AC

## 2021-08-15 MED ORDER — SODIUM CHLORIDE 0.9 % IV SOLN
200.0000 mg | Freq: Once | INTRAVENOUS | Status: AC
  Administered 2021-08-15: 200 mg via INTRAVENOUS
  Filled 2021-08-15: qty 200

## 2021-08-16 ENCOUNTER — Ambulatory Visit (INDEPENDENT_AMBULATORY_CARE_PROVIDER_SITE_OTHER): Payer: PRIVATE HEALTH INSURANCE | Admitting: Family Medicine

## 2021-08-16 VITALS — BP 142/86 | HR 76 | Temp 98.0°F | Resp 18 | Ht 65.0 in

## 2021-08-16 DIAGNOSIS — E1165 Type 2 diabetes mellitus with hyperglycemia: Secondary | ICD-10-CM

## 2021-08-16 DIAGNOSIS — Z23 Encounter for immunization: Secondary | ICD-10-CM | POA: Diagnosis not present

## 2021-08-16 DIAGNOSIS — Z794 Long term (current) use of insulin: Secondary | ICD-10-CM

## 2021-08-16 DIAGNOSIS — N186 End stage renal disease: Secondary | ICD-10-CM | POA: Insufficient documentation

## 2021-08-16 DIAGNOSIS — R1012 Left upper quadrant pain: Secondary | ICD-10-CM

## 2021-08-16 DIAGNOSIS — R112 Nausea with vomiting, unspecified: Secondary | ICD-10-CM | POA: Diagnosis not present

## 2021-08-16 DIAGNOSIS — E1121 Type 2 diabetes mellitus with diabetic nephropathy: Secondary | ICD-10-CM | POA: Diagnosis not present

## 2021-08-16 DIAGNOSIS — R110 Nausea: Secondary | ICD-10-CM | POA: Diagnosis not present

## 2021-08-16 DIAGNOSIS — E1169 Type 2 diabetes mellitus with other specified complication: Secondary | ICD-10-CM

## 2021-08-16 DIAGNOSIS — M4625 Osteomyelitis of vertebra, thoracolumbar region: Secondary | ICD-10-CM

## 2021-08-16 DIAGNOSIS — I209 Angina pectoris, unspecified: Secondary | ICD-10-CM

## 2021-08-16 DIAGNOSIS — Z992 Dependence on renal dialysis: Secondary | ICD-10-CM

## 2021-08-16 DIAGNOSIS — M48061 Spinal stenosis, lumbar region without neurogenic claudication: Secondary | ICD-10-CM

## 2021-08-16 DIAGNOSIS — E785 Hyperlipidemia, unspecified: Secondary | ICD-10-CM | POA: Diagnosis not present

## 2021-08-16 DIAGNOSIS — I10 Essential (primary) hypertension: Secondary | ICD-10-CM

## 2021-08-16 DIAGNOSIS — K766 Portal hypertension: Secondary | ICD-10-CM | POA: Insufficient documentation

## 2021-08-16 MED ORDER — ONDANSETRON HCL 8 MG PO TABS: 8.0000 mg | ORAL_TABLET | Freq: Three times a day (TID) | ORAL | 0 refills | Status: DC | PRN

## 2021-08-16 NOTE — Assessment & Plan Note (Signed)
Well controlled, no changes to meds. Encouraged heart healthy diet such as the DASH diet and exercise as tolerated.  °
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
## 2021-08-16 NOTE — Assessment & Plan Note (Signed)
F.u endo Pt requested that we order her a1c

## 2021-08-16 NOTE — Assessment & Plan Note (Signed)
Encourage heart healthy diet such as MIND or DASH diet, increase exercise, avoid trans fats, simple carbohydrates and processed foods, consider a krill or fish or flaxseed oil cap daily.  °

## 2021-08-16 NOTE — Patient Instructions (Signed)
Abdominal Pain, Adult Pain in the abdomen (abdominal pain) can be caused by many things. Often, abdominal pain is not serious and it gets better with no treatment or by being treated at home. However, sometimes abdominal pain is serious. Your health care provider will ask questions about your medical history and do a physical exam to try to determine the cause of your abdominal pain. Follow these instructions at home: Medicines Take over-the-counter and prescription medicines only as told by your health care provider. Do not take a laxative unless told by your health care provider. General instructions  Watch your condition for any changes. Drink enough fluid to keep your urine pale yellow. Keep all follow-up visits as told by your health care provider. This is important. Contact a health care provider if: Your abdominal pain changes or gets worse. You are not hungry or you lose weight without trying. You are constipated or have diarrhea for more than 2-3 days. You have pain when you urinate or have a bowel movement. Your abdominal pain wakes you up at night. Your pain gets worse with meals, after eating, or with certain foods. You are vomiting and cannot keep anything down. You have a fever. You have blood in your urine. Get help right away if: Your pain does not go away as soon as your health care provider told you to expect. You cannot stop vomiting. Your pain is only in areas of the abdomen, such as the right side or the left lower portion of the abdomen. Pain on the right side could be caused by appendicitis. You have bloody or black stools, or stools that look like tar. You have severe pain, cramping, or bloating in your abdomen. You have signs of dehydration, such as: Dark urine, very little urine, or no urine. Cracked lips. Dry mouth. Sunken eyes. Sleepiness. Weakness. You have trouble breathing or chest pain. Summary Often, abdominal pain is not serious and it gets  better with no treatment or by being treated at home. However, sometimes abdominal pain is serious. Watch your condition for any changes. Take over-the-counter and prescription medicines only as told by your health care provider. Contact a health care provider if your abdominal pain changes or gets worse. Get help right away if you have severe pain, cramping, or bloating in your abdomen. This information is not intended to replace advice given to you by your health care provider. Make sure you discuss any questions you have with your health care provider. Document Revised: 12/05/2019 Document Reviewed: 02/24/2019 Elsevier Patient Education  [place].

## 2021-08-16 NOTE — Assessment & Plan Note (Signed)
Chronic pain  Dds / contract utd

## 2021-08-17 ENCOUNTER — Telehealth: Payer: Self-pay

## 2021-08-17 LAB — COMPREHENSIVE METABOLIC PANEL
ALT: 8 U/L (ref 0–35)
AST: 15 U/L (ref 0–37)
Albumin: 3.5 g/dL (ref 3.5–5.2)
Alkaline Phosphatase: 101 U/L (ref 39–117)
BUN: 51 mg/dL — ABNORMAL HIGH (ref 6–23)
CO2: 23 mEq/L (ref 19–32)
Calcium: 8.5 mg/dL (ref 8.4–10.5)
Chloride: 107 mEq/L (ref 96–112)
Creatinine, Ser: 3.54 mg/dL — ABNORMAL HIGH (ref 0.40–1.20)
GFR: 12.96 mL/min — CL (ref >60.00)
Glucose, Bld: 83 mg/dL (ref 70–99)
Potassium: 5.5 mEq/L — ABNORMAL HIGH (ref 3.5–5.1)
Sodium: 137 mEq/L (ref 135–145)
Total Bilirubin: 0.4 mg/dL (ref 0.2–1.2)
Total Protein: 7.3 g/dL (ref 6.0–8.3)

## 2021-08-17 LAB — LIPID PANEL
Cholesterol: 143 mg/dL (ref 0–200)
HDL: 30.9 mg/dL — ABNORMAL LOW (ref >39.00)
LDL Cholesterol: 87 mg/dL (ref 0–99)
NonHDL: 112.2
Total CHOL/HDL Ratio: 5
Triglycerides: 127 mg/dL (ref 0.0–149.0)
VLDL: 25.4 mg/dL (ref 0.0–40.0)

## 2021-08-17 LAB — HEMOGLOBIN A1C: Hgb A1c MFr Bld: 7 % — ABNORMAL HIGH (ref 4.6–6.5)

## 2021-08-17 NOTE — Telephone Encounter (Signed)
CRITICAL VALUE STICKER  CRITICAL VALUE:GFR 12.96  RECEIVER (on-site recipient of call): Marilouise Densmore  DATE & TIME NOTIFIED: 08/17/21  MESSENGER (representative from lab):Saa  MD NOTIFIED:  TIME OF NOTIFICATION:  RESPONSE:

## 2021-08-18 ENCOUNTER — Encounter: Payer: Self-pay | Admitting: Family Medicine

## 2021-08-18 ENCOUNTER — Encounter: Payer: Self-pay | Admitting: *Deleted

## 2021-08-18 ENCOUNTER — Telehealth: Payer: Self-pay | Admitting: *Deleted

## 2021-08-18 NOTE — Telephone Encounter (Signed)
Labs sent

## 2021-08-18 NOTE — Telephone Encounter (Signed)
I called patient about lab results and she asked about her urine test.  I advised that there was no urine ordered.  There was a urine in the lab that I remembered but no orders.  Urine was tossed.  Looks like we needed UDS at that time (was not in uds cup).  Advised pt we will recollect at next visit.

## 2021-08-19 ENCOUNTER — Inpatient Hospital Stay: Payer: PRIVATE HEALTH INSURANCE

## 2021-08-19 ENCOUNTER — Telehealth: Payer: Self-pay | Admitting: *Deleted

## 2021-08-19 ENCOUNTER — Other Ambulatory Visit: Payer: Self-pay

## 2021-08-19 ENCOUNTER — Ambulatory Visit: Payer: PRIVATE HEALTH INSURANCE

## 2021-08-19 VITALS — BP 170/69 | HR 72 | Temp 98.2°F | Resp 18

## 2021-08-19 DIAGNOSIS — D508 Other iron deficiency anemias: Secondary | ICD-10-CM

## 2021-08-19 DIAGNOSIS — D631 Anemia in chronic kidney disease: Secondary | ICD-10-CM

## 2021-08-19 DIAGNOSIS — D509 Iron deficiency anemia, unspecified: Secondary | ICD-10-CM | POA: Diagnosis not present

## 2021-08-19 DIAGNOSIS — N183 Chronic kidney disease, stage 3 unspecified: Secondary | ICD-10-CM

## 2021-08-19 LAB — CMP (CANCER CENTER ONLY)
ALT: 8 U/L (ref 0–44)
AST: 14 U/L — ABNORMAL LOW (ref 15–41)
Albumin: 3.6 g/dL (ref 3.5–5.0)
Alkaline Phosphatase: 100 U/L (ref 38–126)
Anion gap: 7 (ref 5–15)
BUN: 58 mg/dL — ABNORMAL HIGH (ref 8–23)
CO2: 22 mmol/L (ref 22–32)
Calcium: 9.3 mg/dL (ref 8.9–10.3)
Chloride: 108 mmol/L (ref 98–111)
Creatinine: 3.23 mg/dL (ref 0.44–1.00)
GFR, Estimated: 15 mL/min — ABNORMAL LOW (ref >60)
Glucose, Bld: 136 mg/dL — ABNORMAL HIGH (ref 70–99)
Potassium: 5.7 mmol/L — ABNORMAL HIGH (ref 3.5–5.1)
Sodium: 137 mmol/L (ref 135–145)
Total Bilirubin: 0.3 mg/dL (ref 0.3–1.2)
Total Protein: 7.9 g/dL (ref 6.5–8.1)

## 2021-08-19 LAB — CBC WITH DIFFERENTIAL (CANCER CENTER ONLY)
Abs Immature Granulocytes: 0.02 10*3/uL (ref 0.00–0.07)
Basophils Absolute: 0.1 10*3/uL (ref 0.0–0.1)
Basophils Relative: 1 %
Eosinophils Absolute: 0.2 10*3/uL (ref 0.0–0.5)
Eosinophils Relative: 3 %
HCT: 29.5 % — ABNORMAL LOW (ref 36.0–46.0)
Hemoglobin: 9.1 g/dL — ABNORMAL LOW (ref 12.0–15.0)
Immature Granulocytes: 0 %
Lymphocytes Relative: 17 %
Lymphs Abs: 1.1 10*3/uL (ref 0.7–4.0)
MCH: 27.2 pg (ref 26.0–34.0)
MCHC: 30.8 g/dL (ref 30.0–36.0)
MCV: 88.3 fL (ref 80.0–100.0)
Monocytes Absolute: 0.6 10*3/uL (ref 0.1–1.0)
Monocytes Relative: 8 %
Neutro Abs: 4.8 10*3/uL (ref 1.7–7.7)
Neutrophils Relative %: 71 %
Platelet Count: 202 10*3/uL (ref 150–400)
RBC: 3.34 MIL/uL — ABNORMAL LOW (ref 3.87–5.11)
RDW: 14.7 % (ref 11.5–15.5)
WBC Count: 6.7 10*3/uL (ref 4.0–10.5)
nRBC: 0 % (ref 0.0–0.2)

## 2021-08-19 MED ORDER — EPOETIN ALFA-EPBX 40000 UNIT/ML IJ SOLN
40000.0000 [IU] | Freq: Once | INTRAMUSCULAR | Status: AC
  Administered 2021-08-19: 40000 [IU] via SUBCUTANEOUS

## 2021-08-19 NOTE — Telephone Encounter (Signed)
Dr. Marin Olp notified of creat-3.23.  No new orders received at this time.

## 2021-08-22 ENCOUNTER — Ambulatory Visit: Payer: PRIVATE HEALTH INSURANCE

## 2021-08-22 ENCOUNTER — Inpatient Hospital Stay: Payer: PRIVATE HEALTH INSURANCE

## 2021-08-23 ENCOUNTER — Other Ambulatory Visit: Payer: Self-pay

## 2021-08-23 ENCOUNTER — Ambulatory Visit (HOSPITAL_BASED_OUTPATIENT_CLINIC_OR_DEPARTMENT_OTHER)
Admission: RE | Admit: 2021-08-23 | Discharge: 2021-08-23 | Disposition: A | Discharge status: Home / Self Care | Payer: PRIVATE HEALTH INSURANCE | Source: Ambulatory Visit | Attending: Family Medicine | Admitting: Family Medicine

## 2021-08-23 DIAGNOSIS — R112 Nausea with vomiting, unspecified: Secondary | ICD-10-CM | POA: Insufficient documentation

## 2021-08-30 ENCOUNTER — Other Ambulatory Visit: Payer: Self-pay

## 2021-08-30 ENCOUNTER — Encounter: Admit: 2021-08-30 | Discharge: 2021-08-30 | Payer: MEDICARE

## 2021-08-30 DIAGNOSIS — K7460 Unspecified cirrhosis of liver: Secondary | ICD-10-CM

## 2021-08-30 DIAGNOSIS — K429 Umbilical hernia without obstruction or gangrene: Secondary | ICD-10-CM

## 2021-08-30 MED ORDER — CLONIDINE HCL 0.2 MG PO TAB
ORAL_TABLET | Freq: Three times a day (TID) | 3 refills | Status: AC
Start: 2021-08-30 — End: ?

## 2021-08-30 NOTE — Progress Notes
Patient chart reviewed to assess for adherence.    Upon further review:   Metformin: 90-DS was last filled on 06/23/21, therefore, not yet due for a refill    Estimated proportion of days covered: 100%. No further action required.

## 2021-09-01 ENCOUNTER — Other Ambulatory Visit: Payer: Self-pay | Admitting: Family Medicine

## 2021-09-01 DIAGNOSIS — G894 Chronic pain syndrome: Secondary | ICD-10-CM

## 2021-09-02 NOTE — Telephone Encounter (Signed)
Requesting: Ly[place] Contract: 2019 UDS: 2019 Last OV: 08/16/2021 Next OV: N/A Last Refill: 03/25/2021, #270--0 RF Database:   Please advise

## 2021-09-06 ENCOUNTER — Other Ambulatory Visit: Payer: Self-pay | Admitting: Family Medicine

## 2021-09-06 DIAGNOSIS — M8610 Other acute osteomyelitis, unspecified site: Secondary | ICD-10-CM

## 2021-09-06 DIAGNOSIS — Z981 Arthrodesis status: Secondary | ICD-10-CM

## 2021-09-06 DIAGNOSIS — G894 Chronic pain syndrome: Secondary | ICD-10-CM

## 2021-09-06 NOTE — Telephone Encounter (Signed)
RequestingLebron Quam Contract: 2018 UDS: 2018  Last OV: 08/16/21 Next OV: N/A Last Refill: 08/08/21, #120--0 RF Database:   Please advise

## 2021-09-06 NOTE — Telephone Encounter (Signed)
Medication: HYDROcodone-acetaminophen (NORCO/VICODIN) 5-325 MG tablet  Has the patient contacted their pharmacy? No.  Preferred Pharmacy: [place], [place]  [address], [place] [address]  Phone:  [phone]  Fax:  [phone]

## 2021-09-07 ENCOUNTER — Inpatient Hospital Stay: Payer: PRIVATE HEALTH INSURANCE | Admitting: Family

## 2021-09-07 ENCOUNTER — Inpatient Hospital Stay: Payer: PRIVATE HEALTH INSURANCE

## 2021-09-07 MED ORDER — HYDROCODONE-ACETAMINOPHEN 5-325 MG PO TABS: 1.0000 | ORAL_TABLET | Freq: Four times a day (QID) | ORAL | 0 refills | Status: DC | PRN

## 2021-09-12 ENCOUNTER — Encounter: Admit: 2021-09-12 | Discharge: 2021-09-12 | Payer: MEDICARE

## 2021-09-12 MED ORDER — GLIMEPIRIDE 2 MG PO TAB
ORAL_TABLET | Freq: Every day | 1 refills | Status: AC
Start: 2021-09-12 — End: ?

## 2021-09-13 ENCOUNTER — Encounter: Admit: 2021-09-13 | Discharge: 2021-09-13 | Payer: MEDICARE

## 2021-09-13 ENCOUNTER — Ambulatory Visit: Admit: 2021-09-13 | Discharge: 2021-09-13 | Payer: MEDICARE

## 2021-09-13 DIAGNOSIS — I10 Essential (primary) hypertension: Secondary | ICD-10-CM

## 2021-09-13 DIAGNOSIS — N184 Chronic kidney disease, stage 4 (severe): Secondary | ICD-10-CM

## 2021-09-13 DIAGNOSIS — E119 Type 2 diabetes mellitus without complications: Secondary | ICD-10-CM

## 2021-09-13 DIAGNOSIS — J309 Allergic rhinitis, unspecified: Secondary | ICD-10-CM

## 2021-09-13 DIAGNOSIS — E1129 Type 2 diabetes mellitus with other diabetic kidney complication: Secondary | ICD-10-CM

## 2021-09-13 DIAGNOSIS — E785 Hyperlipidemia, unspecified: Secondary | ICD-10-CM

## 2021-09-13 DIAGNOSIS — K5900 Constipation, unspecified: Secondary | ICD-10-CM

## 2021-09-13 DIAGNOSIS — G5600 Carpal tunnel syndrome, unspecified upper limb: Secondary | ICD-10-CM

## 2021-09-13 DIAGNOSIS — J45909 Unspecified asthma, uncomplicated: Secondary | ICD-10-CM

## 2021-09-13 DIAGNOSIS — K589 Irritable bowel syndrome without diarrhea: Secondary | ICD-10-CM

## 2021-09-13 DIAGNOSIS — E559 Vitamin D deficiency, unspecified: Secondary | ICD-10-CM

## 2021-09-13 DIAGNOSIS — K2970 Gastritis, unspecified, without bleeding: Secondary | ICD-10-CM

## 2021-09-13 DIAGNOSIS — G9050 Complex regional pain syndrome I, unspecified: Secondary | ICD-10-CM

## 2021-09-13 DIAGNOSIS — D649 Anemia, unspecified: Secondary | ICD-10-CM

## 2021-09-13 DIAGNOSIS — M79642 Pain in left hand: Secondary | ICD-10-CM

## 2021-09-13 MED ORDER — DROPSAFE ALCOHOL PREP PADS TP PADM
1 refills | 100.00000 days | Status: AC
Start: 2021-09-13 — End: ?

## 2021-09-13 MED ORDER — ACCU-CHEK GUIDE TEST STRIPS MISC STRP
ORAL_STRIP | Freq: Two times a day (BID) | SUBCUTANEOUS | 1 refills | 30.00000 days | Status: AC
Start: 2021-09-13 — End: ?

## 2021-09-13 MED ORDER — BETAMETHASONE VALERATE 0.1 % TP CREA
Freq: Every day | 1 refills | 30.00000 days | Status: AC
Start: 2021-09-13 — End: ?

## 2021-09-20 ENCOUNTER — Ambulatory Visit: Payer: PRIVATE HEALTH INSURANCE | Admitting: Internal Medicine

## 2021-09-20 ENCOUNTER — Other Ambulatory Visit: Payer: Self-pay | Admitting: Family

## 2021-09-20 DIAGNOSIS — E7211 Homocystinuria: Secondary | ICD-10-CM

## 2021-09-21 ENCOUNTER — Other Ambulatory Visit: Payer: Self-pay

## 2021-09-21 ENCOUNTER — Encounter: Payer: Self-pay | Admitting: Family

## 2021-09-21 ENCOUNTER — Inpatient Hospital Stay: Payer: PRIVATE HEALTH INSURANCE | Attending: Hematology & Oncology

## 2021-09-21 ENCOUNTER — Inpatient Hospital Stay (HOSPITAL_BASED_OUTPATIENT_CLINIC_OR_DEPARTMENT_OTHER): Payer: PRIVATE HEALTH INSURANCE | Admitting: Family

## 2021-09-21 ENCOUNTER — Inpatient Hospital Stay: Payer: PRIVATE HEALTH INSURANCE

## 2021-09-21 VITALS — BP 143/69 | HR 72 | Temp 98.0°F | Resp 18 | Ht 65.0 in | Wt 164.0 lb

## 2021-09-21 DIAGNOSIS — D508 Other iron deficiency anemias: Secondary | ICD-10-CM

## 2021-09-21 DIAGNOSIS — D500 Iron deficiency anemia secondary to blood loss (chronic): Secondary | ICD-10-CM

## 2021-09-21 DIAGNOSIS — E611 Iron deficiency: Secondary | ICD-10-CM | POA: Diagnosis not present

## 2021-09-21 DIAGNOSIS — N183 Chronic kidney disease, stage 3 unspecified: Secondary | ICD-10-CM

## 2021-09-21 DIAGNOSIS — D631 Anemia in chronic kidney disease: Secondary | ICD-10-CM | POA: Diagnosis not present

## 2021-09-21 LAB — CMP (CANCER CENTER ONLY)
ALT: 9 U/L (ref 0–44)
AST: 14 U/L — ABNORMAL LOW (ref 15–41)
Albumin: 3.4 g/dL — ABNORMAL LOW (ref 3.5–5.0)
Alkaline Phosphatase: 103 U/L (ref 38–126)
Anion gap: 8 (ref 5–15)
BUN: 54 mg/dL — ABNORMAL HIGH (ref 8–23)
CO2: 22 mmol/L (ref 22–32)
Calcium: 9 mg/dL (ref 8.9–10.3)
Chloride: 108 mmol/L (ref 98–111)
Creatinine: 3.6 mg/dL (ref 0.44–1.00)
GFR, Estimated: 13 mL/min — ABNORMAL LOW (ref >60)
Glucose, Bld: 94 mg/dL (ref 70–99)
Potassium: 5.4 mmol/L — ABNORMAL HIGH (ref 3.5–5.1)
Sodium: 138 mmol/L (ref 135–145)
Total Bilirubin: 0.3 mg/dL (ref 0.3–1.2)
Total Protein: 7.5 g/dL (ref 6.5–8.1)

## 2021-09-21 LAB — CBC WITH DIFFERENTIAL (CANCER CENTER ONLY)
Abs Immature Granulocytes: 0.02 10*3/uL (ref 0.00–0.07)
Basophils Absolute: 0.1 10*3/uL (ref 0.0–0.1)
Basophils Relative: 1 %
Eosinophils Absolute: 0.2 10*3/uL (ref 0.0–0.5)
Eosinophils Relative: 3 %
HCT: 30.2 % — ABNORMAL LOW (ref 36.0–46.0)
Hemoglobin: 9.3 g/dL — ABNORMAL LOW (ref 12.0–15.0)
Immature Granulocytes: 0 %
Lymphocytes Relative: 18 %
Lymphs Abs: 1.2 10*3/uL (ref 0.7–4.0)
MCH: 27.4 pg (ref 26.0–34.0)
MCHC: 30.8 g/dL (ref 30.0–36.0)
MCV: 89.1 fL (ref 80.0–100.0)
Monocytes Absolute: 0.6 10*3/uL (ref 0.1–1.0)
Monocytes Relative: 10 %
Neutro Abs: 4.3 10*3/uL (ref 1.7–7.7)
Neutrophils Relative %: 68 %
Platelet Count: 149 10*3/uL — ABNORMAL LOW (ref 150–400)
RBC: 3.39 MIL/uL — ABNORMAL LOW (ref 3.87–5.11)
RDW: 15.7 % — ABNORMAL HIGH (ref 11.5–15.5)
WBC Count: 6.4 10*3/uL (ref 4.0–10.5)
nRBC: 0 % (ref 0.0–0.2)

## 2021-09-21 MED ORDER — EPOETIN ALFA-EPBX 40000 UNIT/ML IJ SOLN
40000.0000 [IU] | Freq: Once | INTRAMUSCULAR | Status: AC
  Administered 2021-09-21: 40000 [IU] via SUBCUTANEOUS
  Filled 2021-09-21: qty 1

## 2021-09-21 NOTE — Patient Instructions (Signed)
Epoetin Alfa injection °What is this medication? °EPOETIN ALFA (e POE e tin AL fa) helps your body make more red blood cells. This medicine is used to treat anemia caused by chronic kidney disease, cancer chemotherapy, or HIV-therapy. It may also be used before surgery if you have anemia. °This medicine may be used for other purposes; ask your health care provider or pharmacist if you have questions. °COMMON BRAND NAME(S): Epogen, Procrit, Retacrit °What should I tell my care team before I take this medication? °They need to know if you have any of these conditions: °cancer °heart disease °high blood pressure °history of blood clots °history of stroke °low levels of folate, iron, or vitamin B12 in the blood °seizures °an unusual or allergic reaction to erythropoietin, albumin, benzyl alcohol, hamster proteins, other medicines, foods, dyes, or preservatives °pregnant or trying to get pregnant °breast-feeding °How should I use this medication? °This medicine is for injection into a vein or under the skin. It is usually given by a health care professional in a hospital or clinic setting. °If you get this medicine at home, you will be taught how to prepare and give this medicine. Use exactly as directed. Take your medicine at regular intervals. Do not take your medicine more often than directed. °It is important that you put your used needles and syringes in a special sharps container. Do not put them in a trash can. If you do not have a sharps container, call your pharmacist or healthcare provider to get one. °A special MedGuide will be given to you by the pharmacist with each prescription and refill. Be sure to read this information carefully each time. °Talk to your pediatrician regarding the use of this medicine in children. While this drug may be prescribed for selected conditions, precautions do apply. °Overdosage: If you think you have taken too much of this medicine contact a poison control center or emergency  room at once. °NOTE: This medicine is only for you. Do not share this medicine with others. °What if I miss a dose? °If you miss a dose, take it as soon as you can. If it is almost time for your next dose, take only that dose. Do not take double or extra doses. °What may interact with this medication? °Interactions have not been studied. °This list may not describe all possible interactions. Give your health care provider a list of all the medicines, herbs, non-prescription drugs, or dietary supplements you use. Also tell them if you smoke, drink alcohol, or use illegal drugs. Some items may interact with your medicine. °What should I watch for while using this medication? °Your condition will be monitored carefully while you are receiving this medicine. °You may need blood work done while you are taking this medicine. °This medicine may cause a decrease in vitamin B6. You should make sure that you get enough vitamin B6 while you are taking this medicine. Discuss the foods you eat and the vitamins you take with your health care professional. °What side effects may I notice from receiving this medication? °Side effects that you should report to your doctor or health care professional as soon as possible: °allergic reactions like skin rash, itching or hives, swelling of the face, lips, or tongue °seizures °signs and symptoms of a blood clot such as breathing problems; changes in vision; chest pain; severe, sudden headache; pain, swelling, warmth in the leg; trouble speaking; sudden numbness or weakness of the face, arm or leg °signs and symptoms of a stroke like   changes in vision; confusion; trouble speaking or understanding; severe headaches; sudden numbness or weakness of the face, arm or leg; trouble walking; dizziness; loss of balance or coordination °Side effects that usually do not require medical attention (report to your doctor or health care professional if they continue or are  bothersome): °chills °cough °dizziness °fever °headaches °joint pain °muscle cramps °muscle pain °nausea, vomiting °pain, redness, or irritation at site where injected °This list may not describe all possible side effects. Call your doctor for medical advice about side effects. You may report side effects to FDA at 1-800-FDA-1088. °Where should I keep my medication? °Keep out of the reach of children. °Store in a refrigerator between 2 and 8 degrees C (36 and 46 degrees F). Do not freeze or shake. Throw away any unused portion if using a single-dose vial. Multi-dose vials can be kept in the refrigerator for up to 21 days after the initial dose. Throw away unused medicine. °NOTE: This sheet is a summary. It may not cover all possible information. If you have questions about this medicine, talk to your doctor, pharmacist, or health care provider. °© 2022 Elsevier/Gold Standard (2017-06-19 00:00:00) ° °

## 2021-09-21 NOTE — Progress Notes (Signed)
Hematology and Oncology Follow Up Visit  [name] [name] [id number] 01-29-1956 65 y.o. 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 She is symptomatic with fatigue.  No blood loss noted. No abnormal bruising, no petechiae.  Neuropathy is unchanged from baseline.  No falls or syncope reported.  No fever, chills, cough, rash, dizziness, chest pain, abdominal pain or changes in bowel or bladder habits.  Her appetite comes and goes. She is doing her best to hydrate properly. Weight is stable at 164 lbs.   ECOG Performance Status: 1 - Symptomatic but completely ambulatory  Medications:  Allergies as of 09/21/2021       Reactions   Bee Pollen Anaphylaxis   Fish-derived Products Hives, Shortness Of Breath, Swelling, Rash   Hives get in throat causing trouble breathing   Mushroom Extract Complex Anaphylaxis   Penicillins Anaphylaxis   **Tolerated cefepime March 2021 Did it involve swelling of the face/tongue/throat, SOB, or low BP? Yes Did it involve sudden or severe rash/hives, skin peeling, or any reaction on the inside of your mouth or nose? No Did you need to seek medical attention at a hospital or doctor's office? Yes When did it last happen?  A few months ago If all above answers are "NO", may proceed with cephalosporin use.   Rosemary Oil Anaphylaxis   Shellfish Allergy Hives, Shortness Of Breath, Swelling, Rash   Tomato Hives, Shortness Of Breath   Hives in throat causes her trouble breathing   Acetaminophen Other (See Comments)   GI upset   Aloe Vera Hives   Broccoli [brassica Oleracea] Hives    Acyclovir And Related Other (See Comments)   Unknown reaction   Naproxen Other (See Comments)   Unknown reaction        Medication List        Accurate as of September 21, 2021  1:23 PM. If you have any questions, ask your nurse or doctor.          acetaminophen 325 MG tablet Commonly known as: TYLENOL Take 2 tablets (650 mg total) by mouth every 6 (six) hours as needed for fever.   atorvastatin 10 MG tablet Commonly known as: LIPITOR Take 1 tablet by mouth once daily   azelastine 0.05 % ophthalmic solution Commonly known as: OPTIVAR Place 1 drop into both eyes 2 (two) times daily.   azelastine 0.1 % nasal spray Commonly known as: ASTELIN Place 1 spray into both nostrils 2 (two) times daily. Use in each nostril as directed   Ciclopirox 1 % shampoo Massage in scalp and let sit 3 min and rinse 2 x weekly   ciprofloxacin 500 MG tablet Commonly known as: CIPRO Take 500 mg by mouth daily.   diazepam 5 MG tablet Commonly known as: VALIUM Take 1 tablet (5 mg total) by mouth every 12 (twelve) hours as needed for anxiety.   DULoxetine 60 MG capsule Commonly known as: CYMBALTA Take 1 capsule by mouth once daily   fluticasone 50 MCG/ACT nasal spray Commonly known as: FLONASE Place 2 sprays into both nostrils daily.   folic acid 1  MG tablet Commonly known as: FOLVITE Take 2 tablets by mouth once daily   FreeStyle Libre 14 Day Reader Kerrin Mo 1 Device by Does not apply route daily.   FreeStyle Libre 14 Day Sensor Misc 1 Device by Does not apply route daily.   furosemide 20 MG tablet Commonly known as: LASIX Take 20 mg by mouth daily.   HumaLOG KwikPen 200 UNIT/ML KwikPen Generic drug: insulin lispro 15 units sub-q twice daily   HYDROcodone-acetaminophen 5-325 MG tablet Commonly known as: NORCO/VICODIN Take 1 tablet by mouth every 6 (six) hours as needed for moderate pain.   Lantus SoloStar 100 UNIT/ML Solostar Pen Generic drug: insulin glargine INJECT 20  UNITS SUBCUTANEOUSLY IN THE MORNING AND 40 AT NIGHT   lidocaine 5 % Commonly known as: Lidoderm Place 1 patch onto the skin daily. Remove & Discard patch within 12 hours or as directed by MD   montelukast 10 MG tablet Commonly known as: SINGULAIR TAKE 1 TABLET BY MOUTH AT BEDTIME   multivitamin Chew chewable tablet Chew 1 tablet by mouth daily.   multivitamin with minerals Tabs tablet Take 1 tablet by mouth daily.   naftifine 1 % cream Commonly known as: NAFTIN Apply topically daily.   nystatin powder Generic drug: nystatin APPLY  POWDER TOPICALLY THREE TIMES DAILY   ondansetron 8 MG tablet Commonly known as: Zofran Take 1 tablet (8 mg total) by mouth every 8 (eight) hours as needed for nausea or vomiting.   pregabalin 75 MG capsule Commonly known as: LYRICA TAKE 1 CAPSULE BY MOUTH THREE TIMES DAILY   Xarelto 2.5 MG Tabs tablet Generic drug: rivaroxaban Take by mouth.        Allergies:  Allergies  Allergen Reactions   Bee Pollen Anaphylaxis   Fish-Derived Products Hives, Shortness Of Breath, Swelling and Rash    Hives get in throat causing trouble breathing   Mushroom Extract Complex Anaphylaxis   Penicillins Anaphylaxis    **Tolerated cefepime March 2021 Did it involve swelling of the face/tongue/throat, SOB, or low BP? Yes Did it involve sudden or severe rash/hives, skin peeling, or any reaction on the inside of your mouth or nose? No Did you need to seek medical attention at a hospital or doctor's office? Yes When did it last happen?  A few months ago If all above answers are "NO", may proceed with cephalosporin use.   Rosemary Oil Anaphylaxis   Shellfish Allergy Hives, Shortness Of Breath, Swelling and Rash   Tomato Hives and Shortness Of Breath    Hives in throat causes her trouble breathing   Acetaminophen Other (See Comments)    GI upset   Aloe Vera Hives   Broccoli [Brassica Oleracea] Hives   Acyclovir And Related Other (See Comments)    Unknown  reaction   Naproxen Other (See Comments)    Unknown reaction    Past Medical History, Surgical history, Social history, and Family History were reviewed and updated.  Review of Systems: All other 10 point review of systems is negative.   Physical Exam:  vitals were not taken for this visit.   Wt Readings from Last 3 Encounters:  07/29/21 162 lb (73.5 kg)  01/25/21 165 lb (74.8 kg)  11/23/20 169 lb (76.7 kg)    Ocular: Sclerae unicteric, pupils equal, round and reactive to light Ear-nose-throat: Oropharynx clear, dentition fair Lymphatic: No cervical or supraclavicular adenopathy Lungs no rales or rhonchi, good excursion bilaterally Heart regular rate and rhythm, no murmur appreciated Abd soft, nontender, positive bowel sounds  MSK no focal spinal tenderness, no joint edema Neuro: non-focal, well-oriented, appropriate affect Breasts: Deferred   Lab Results  Component Value Date   WBC 6.4 09/21/2021   HGB 9.3 (L) 09/21/2021   HCT 30.2 (L) 09/21/2021   MCV 89.1 09/21/2021   PLT 149 (L) 09/21/2021   Lab Results  Component Value Date   FERRITIN 414 (H) 07/29/2021   IRON 40 (L) 07/29/2021   TIBC 243 07/29/2021   UIBC 203 07/29/2021   IRONPCTSAT 16 (L) 07/29/2021   Lab Results  Component Value Date   RETICCTPCT 2.1 07/29/2021   RBC 3.39 (L) 09/21/2021   No results found for: KPAFRELGTCHN, LAMBDASER, KAPLAMBRATIO No results found for: IGGSERUM, IGA, IGMSERUM No results found for: Ronnald Ramp, A1GS, Gilford Silvius, MSPIKE, SPEI   Chemistry      Component Value Date/Time   NA 137 08/19/2021 1423   K 5.7 (H) 08/19/2021 1423   CL 108 08/19/2021 1423   CO2 22 08/19/2021 1423   BUN 58 (H) 08/19/2021 1423   CREATININE 3.23 (HH) 08/19/2021 1423   CREATININE 1.97 (H) 06/24/2020 1431      Component Value Date/Time   CALCIUM 9.3 08/19/2021 1423   ALKPHOS 100 08/19/2021 1423   AST 14 (L) 08/19/2021 1423   ALT 8 08/19/2021 1423   BILITOT 0.3  08/19/2021 1423       Impression and Plan: [name] [name] is a very pleasant 65 yo caucasian female with long standing history of iron deficiency anemia as well as history of DVT's in both the right and left peroneal and posterior tibial veins while she was bed bound in the hospital being treated for osteomyelitis.  She had an elevated homocystine level, mildly elevated anticardiolipin antibody IgG level and elevated D-dimer at that time as well as on repeat lab work.  ESA given for Hgb 9.3.  Iron studies are pending. We will replace if needed.  Lab and injection every 3 weeks follow-up in 9 weeks.  She can contact our office with any questions or concerns.   Lottie Dawson, NP 11/23/20221:23 PM

## 2021-09-23 LAB — IRON AND TIBC
Iron: 49 ug/dL (ref 41–142)
Saturation Ratios: 23 % (ref 21–57)
TIBC: 216 ug/dL — ABNORMAL LOW (ref 236–444)
UIBC: 168 ug/dL (ref 120–384)

## 2021-09-23 LAB — FERRITIN: Ferritin: 450 ng/mL — ABNORMAL HIGH (ref 11–307)

## 2021-09-29 ENCOUNTER — Ambulatory Visit: Admit: 2021-09-29 | Discharge: 2021-09-29 | Payer: MEDICARE

## 2021-09-29 ENCOUNTER — Encounter: Admit: 2021-09-29 | Discharge: 2021-09-29 | Payer: MEDICARE

## 2021-09-29 DIAGNOSIS — J309 Allergic rhinitis, unspecified: Secondary | ICD-10-CM

## 2021-09-29 DIAGNOSIS — E119 Type 2 diabetes mellitus without complications: Secondary | ICD-10-CM

## 2021-09-29 DIAGNOSIS — M79642 Pain in left hand: Secondary | ICD-10-CM

## 2021-09-29 DIAGNOSIS — J45909 Unspecified asthma, uncomplicated: Secondary | ICD-10-CM

## 2021-09-29 DIAGNOSIS — E785 Hyperlipidemia, unspecified: Secondary | ICD-10-CM

## 2021-09-29 DIAGNOSIS — I10 Essential (primary) hypertension: Secondary | ICD-10-CM

## 2021-09-29 DIAGNOSIS — E559 Vitamin D deficiency, unspecified: Secondary | ICD-10-CM

## 2021-09-29 DIAGNOSIS — D649 Anemia, unspecified: Secondary | ICD-10-CM

## 2021-09-29 DIAGNOSIS — K589 Irritable bowel syndrome without diarrhea: Secondary | ICD-10-CM

## 2021-09-29 DIAGNOSIS — K2970 Gastritis, unspecified, without bleeding: Secondary | ICD-10-CM

## 2021-09-29 DIAGNOSIS — G5600 Carpal tunnel syndrome, unspecified upper limb: Secondary | ICD-10-CM

## 2021-09-29 DIAGNOSIS — N184 Chronic kidney disease, stage 4 (severe): Secondary | ICD-10-CM

## 2021-09-29 DIAGNOSIS — T466X5A Adverse effect of antihyperlipidemic and antiarteriosclerotic drugs, initial encounter: Secondary | ICD-10-CM

## 2021-09-29 DIAGNOSIS — E1129 Type 2 diabetes mellitus with other diabetic kidney complication: Secondary | ICD-10-CM

## 2021-09-29 DIAGNOSIS — K5900 Constipation, unspecified: Secondary | ICD-10-CM

## 2021-09-29 DIAGNOSIS — G9050 Complex regional pain syndrome I, unspecified: Secondary | ICD-10-CM

## 2021-09-29 MED ORDER — PIOGLITAZONE 15 MG PO TAB
15 mg | ORAL_TABLET | Freq: Every day | ORAL | 3 refills | 90.00000 days | Status: AC
Start: 2021-09-29 — End: ?
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
## 2021-10-03 ENCOUNTER — Other Ambulatory Visit: Payer: Self-pay | Admitting: Cardiology

## 2021-10-04 ENCOUNTER — Other Ambulatory Visit: Payer: Self-pay | Admitting: Family Medicine

## 2021-10-04 DIAGNOSIS — G894 Chronic pain syndrome: Secondary | ICD-10-CM

## 2021-10-04 DIAGNOSIS — M8610 Other acute osteomyelitis, unspecified site: Secondary | ICD-10-CM

## 2021-10-04 DIAGNOSIS — Z981 Arthrodesis status: Secondary | ICD-10-CM

## 2021-10-04 MED ORDER — HYDROCODONE-ACETAMINOPHEN 5-325 MG PO TABS: 1.0000 | ORAL_TABLET | Freq: Four times a day (QID) | ORAL | 0 refills | Status: DC | PRN

## 2021-10-04 NOTE — Telephone Encounter (Signed)
Requesting: [facility] Contract:  UDS: Last OV: 08/16/2021 Next OV: N/A Last Refill: 09/07/2021, #[id number] RF Database:   Please advise

## 2021-10-04 NOTE — Telephone Encounter (Signed)
Medication:  Hydrocodone-acetaminophen (norco/vicodin) 5-325 mg tablet     Has the patient contacted their pharmacy? No. (If no, request that the patient contact the pharmacy for the refill.) (If yes, when and what did the pharmacy advise?)     Preferred Pharmacy (with phone number or street name):  [facility] [address] [place], [place] - [address]  [address], [place] [address]  Phone:  [phone]  Fax:  [phone]     Agent: Please be advised that RX refills may take up to 3 business days. We ask that you follow-up with your pharmacy.

## 2021-10-07 ENCOUNTER — Telehealth: Payer: Self-pay | Admitting: Cardiology

## 2021-10-07 ENCOUNTER — Other Ambulatory Visit: Payer: Self-pay | Admitting: Surgery

## 2021-10-07 MED ORDER — FUROSEMIDE 20 MG PO TABS: 20.0000 mg | ORAL_TABLET | Freq: Every day | ORAL | 3 refills | Status: DC

## 2021-10-07 NOTE — Telephone Encounter (Signed)
*  STAT* If patient is at the pharmacy, call can be transferred to refill team.   1. Which medications need to be refilled? (please list name of each medication and dose if known)  furosemide (LASIX) 20 MG tablet  2. Which pharmacy/location (including street and city if local pharmacy) is medication to be sent to? [facility] - [place], [place] - [address]  3. Do they need a 30 day or 90 day supply?   90 day supply  Patient states she is completely out of medication.

## 2021-10-07 NOTE — Telephone Encounter (Signed)
Refill sent in per request.  

## 2021-10-11 ENCOUNTER — Other Ambulatory Visit: Payer: PRIVATE HEALTH INSURANCE

## 2021-10-11 ENCOUNTER — Ambulatory Visit: Payer: PRIVATE HEALTH INSURANCE

## 2021-10-16 ENCOUNTER — Emergency Department (HOSPITAL_COMMUNITY): Payer: PRIVATE HEALTH INSURANCE

## 2021-10-16 ENCOUNTER — Encounter (HOSPITAL_COMMUNITY): Payer: Self-pay | Admitting: Emergency Medicine

## 2021-10-16 ENCOUNTER — Inpatient Hospital Stay (HOSPITAL_COMMUNITY)
Admission: EM | Admit: 2021-10-16 | Discharge: 2021-10-22 | DRG: 872 | Disposition: A | Discharge status: Home Health | Payer: PRIVATE HEALTH INSURANCE | Attending: Internal Medicine | Admitting: Internal Medicine

## 2021-10-16 ENCOUNTER — Encounter: Payer: Self-pay | Admitting: Family

## 2021-10-16 ENCOUNTER — Other Ambulatory Visit: Payer: Self-pay

## 2021-10-16 ENCOUNTER — Inpatient Hospital Stay (HOSPITAL_COMMUNITY): Payer: PRIVATE HEALTH INSURANCE

## 2021-10-16 DIAGNOSIS — L97519 Non-pressure chronic ulcer of other part of right foot with unspecified severity: Secondary | ICD-10-CM | POA: Diagnosis present

## 2021-10-16 DIAGNOSIS — E1140 Type 2 diabetes mellitus with diabetic neuropathy, unspecified: Secondary | ICD-10-CM | POA: Diagnosis present

## 2021-10-16 DIAGNOSIS — I129 Hypertensive chronic kidney disease with stage 1 through stage 4 chronic kidney disease, or unspecified chronic kidney disease: Secondary | ICD-10-CM | POA: Diagnosis present

## 2021-10-16 DIAGNOSIS — N189 Chronic kidney disease, unspecified: Secondary | ICD-10-CM

## 2021-10-16 DIAGNOSIS — Z20822 Contact with and (suspected) exposure to covid-19: Secondary | ICD-10-CM | POA: Diagnosis present

## 2021-10-16 DIAGNOSIS — L02611 Cutaneous abscess of right foot: Secondary | ICD-10-CM | POA: Diagnosis present

## 2021-10-16 DIAGNOSIS — Z981 Arthrodesis status: Secondary | ICD-10-CM

## 2021-10-16 DIAGNOSIS — R531 Weakness: Secondary | ICD-10-CM

## 2021-10-16 DIAGNOSIS — E11621 Type 2 diabetes mellitus with foot ulcer: Secondary | ICD-10-CM | POA: Diagnosis present

## 2021-10-16 DIAGNOSIS — L03116 Cellulitis of left lower limb: Secondary | ICD-10-CM | POA: Diagnosis present

## 2021-10-16 DIAGNOSIS — A419 Sepsis, unspecified organism: Principal | ICD-10-CM | POA: Diagnosis present

## 2021-10-16 DIAGNOSIS — E1165 Type 2 diabetes mellitus with hyperglycemia: Secondary | ICD-10-CM | POA: Diagnosis present

## 2021-10-16 DIAGNOSIS — R7881 Bacteremia: Secondary | ICD-10-CM

## 2021-10-16 DIAGNOSIS — L97429 Non-pressure chronic ulcer of left heel and midfoot with unspecified severity: Secondary | ICD-10-CM | POA: Diagnosis present

## 2021-10-16 DIAGNOSIS — E1122 Type 2 diabetes mellitus with diabetic chronic kidney disease: Secondary | ICD-10-CM | POA: Diagnosis present

## 2021-10-16 DIAGNOSIS — K7460 Unspecified cirrhosis of liver: Secondary | ICD-10-CM | POA: Diagnosis present

## 2021-10-16 DIAGNOSIS — L97509 Non-pressure chronic ulcer of other part of unspecified foot with unspecified severity: Secondary | ICD-10-CM

## 2021-10-16 DIAGNOSIS — G8929 Other chronic pain: Secondary | ICD-10-CM | POA: Diagnosis present

## 2021-10-16 DIAGNOSIS — N184 Chronic kidney disease, stage 4 (severe): Secondary | ICD-10-CM | POA: Diagnosis present

## 2021-10-16 DIAGNOSIS — M797 Fibromyalgia: Secondary | ICD-10-CM | POA: Diagnosis present

## 2021-10-16 DIAGNOSIS — D631 Anemia in chronic kidney disease: Secondary | ICD-10-CM | POA: Diagnosis present

## 2021-10-16 DIAGNOSIS — E1169 Type 2 diabetes mellitus with other specified complication: Secondary | ICD-10-CM | POA: Diagnosis present

## 2021-10-16 DIAGNOSIS — I252 Old myocardial infarction: Secondary | ICD-10-CM

## 2021-10-16 DIAGNOSIS — E871 Hypo-osmolality and hyponatremia: Secondary | ICD-10-CM | POA: Diagnosis present

## 2021-10-16 DIAGNOSIS — E7800 Pure hypercholesterolemia, unspecified: Secondary | ICD-10-CM | POA: Diagnosis present

## 2021-10-16 DIAGNOSIS — Z806 Family history of leukemia: Secondary | ICD-10-CM

## 2021-10-16 DIAGNOSIS — M86171 Other acute osteomyelitis, right ankle and foot: Secondary | ICD-10-CM | POA: Diagnosis present

## 2021-10-16 DIAGNOSIS — E860 Dehydration: Secondary | ICD-10-CM | POA: Diagnosis present

## 2021-10-16 DIAGNOSIS — R6520 Severe sepsis without septic shock: Secondary | ICD-10-CM | POA: Diagnosis not present

## 2021-10-16 DIAGNOSIS — T8619 Other complication of kidney transplant: Secondary | ICD-10-CM | POA: Diagnosis present

## 2021-10-16 DIAGNOSIS — M86672 Other chronic osteomyelitis, left ankle and foot: Secondary | ICD-10-CM | POA: Diagnosis present

## 2021-10-16 DIAGNOSIS — Z90721 Acquired absence of ovaries, unilateral: Secondary | ICD-10-CM

## 2021-10-16 DIAGNOSIS — Z86718 Personal history of other venous thrombosis and embolism: Secondary | ICD-10-CM

## 2021-10-16 DIAGNOSIS — L03115 Cellulitis of right lower limb: Secondary | ICD-10-CM | POA: Diagnosis present

## 2021-10-16 DIAGNOSIS — L97414 Non-pressure chronic ulcer of right heel and midfoot with necrosis of bone: Secondary | ICD-10-CM | POA: Diagnosis not present

## 2021-10-16 DIAGNOSIS — Z8249 Family history of ischemic heart disease and other diseases of the circulatory system: Secondary | ICD-10-CM

## 2021-10-16 DIAGNOSIS — Z90710 Acquired absence of both cervix and uterus: Secondary | ICD-10-CM

## 2021-10-16 DIAGNOSIS — L97424 Non-pressure chronic ulcer of left heel and midfoot with necrosis of bone: Secondary | ICD-10-CM | POA: Diagnosis not present

## 2021-10-16 DIAGNOSIS — Y830 Surgical operation with transplant of whole organ as the cause of abnormal reaction of the patient, or of later complication, without mention of misadventure at the time of the procedure: Secondary | ICD-10-CM | POA: Diagnosis present

## 2021-10-16 DIAGNOSIS — R110 Nausea: Secondary | ICD-10-CM

## 2021-10-16 DIAGNOSIS — I10 Essential (primary) hypertension: Secondary | ICD-10-CM | POA: Diagnosis present

## 2021-10-16 DIAGNOSIS — Z833 Family history of diabetes mellitus: Secondary | ICD-10-CM

## 2021-10-16 DIAGNOSIS — E872 Acidosis, unspecified: Secondary | ICD-10-CM | POA: Diagnosis present

## 2021-10-16 DIAGNOSIS — M86472 Chronic osteomyelitis with draining sinus, left ankle and foot: Secondary | ICD-10-CM | POA: Diagnosis not present

## 2021-10-16 DIAGNOSIS — L089 Local infection of the skin and subcutaneous tissue, unspecified: Secondary | ICD-10-CM | POA: Diagnosis not present

## 2021-10-16 DIAGNOSIS — M869 Osteomyelitis, unspecified: Secondary | ICD-10-CM | POA: Diagnosis present

## 2021-10-16 DIAGNOSIS — E875 Hyperkalemia: Secondary | ICD-10-CM | POA: Diagnosis not present

## 2021-10-16 DIAGNOSIS — N179 Acute kidney failure, unspecified: Secondary | ICD-10-CM | POA: Diagnosis present

## 2021-10-16 DIAGNOSIS — I2510 Atherosclerotic heart disease of native coronary artery without angina pectoris: Secondary | ICD-10-CM | POA: Diagnosis present

## 2021-10-16 DIAGNOSIS — E1129 Type 2 diabetes mellitus with other diabetic kidney complication: Secondary | ICD-10-CM

## 2021-10-16 DIAGNOSIS — S2242XA Multiple fractures of ribs, left side, initial encounter for closed fracture: Secondary | ICD-10-CM

## 2021-10-16 DIAGNOSIS — N185 Chronic kidney disease, stage 5: Secondary | ICD-10-CM | POA: Diagnosis not present

## 2021-10-16 DIAGNOSIS — Z9049 Acquired absence of other specified parts of digestive tract: Secondary | ICD-10-CM

## 2021-10-16 DIAGNOSIS — Z7901 Long term (current) use of anticoagulants: Secondary | ICD-10-CM

## 2021-10-16 DIAGNOSIS — E43 Unspecified severe protein-calorie malnutrition: Secondary | ICD-10-CM | POA: Diagnosis not present

## 2021-10-16 DIAGNOSIS — Z79899 Other long term (current) drug therapy: Secondary | ICD-10-CM

## 2021-10-16 DIAGNOSIS — Z794 Long term (current) use of insulin: Secondary | ICD-10-CM

## 2021-10-16 DIAGNOSIS — D72829 Elevated white blood cell count, unspecified: Secondary | ICD-10-CM

## 2021-10-16 DIAGNOSIS — K760 Fatty (change of) liver, not elsewhere classified: Secondary | ICD-10-CM | POA: Diagnosis present

## 2021-10-16 DIAGNOSIS — M86372 Chronic multifocal osteomyelitis, left ankle and foot: Secondary | ICD-10-CM

## 2021-10-16 DIAGNOSIS — E11628 Type 2 diabetes mellitus with other skin complications: Secondary | ICD-10-CM | POA: Diagnosis not present

## 2021-10-16 DIAGNOSIS — K219 Gastro-esophageal reflux disease without esophagitis: Secondary | ICD-10-CM | POA: Diagnosis present

## 2021-10-16 DIAGNOSIS — E119 Type 2 diabetes mellitus without complications: Secondary | ICD-10-CM

## 2021-10-16 DIAGNOSIS — F4310 Post-traumatic stress disorder, unspecified: Secondary | ICD-10-CM | POA: Diagnosis present

## 2021-10-16 LAB — CBC WITH DIFFERENTIAL/PLATELET
Abs Immature Granulocytes: 0 10*3/uL (ref 0.00–0.07)
Basophils Absolute: 0 10*3/uL (ref 0.0–0.1)
Basophils Relative: 0 %
Eosinophils Absolute: 0 10*3/uL (ref 0.0–0.5)
Eosinophils Relative: 0 %
HCT: 25.6 % — ABNORMAL LOW (ref 36.0–46.0)
Hemoglobin: 8.2 g/dL — ABNORMAL LOW (ref 12.0–15.0)
Lymphocytes Relative: 0 %
Lymphs Abs: 0 10*3/uL — ABNORMAL LOW (ref 0.7–4.0)
MCH: 26.5 pg (ref 26.0–34.0)
MCHC: 32 g/dL (ref 30.0–36.0)
MCV: 82.8 fL (ref 80.0–100.0)
Monocytes Absolute: 1.3 10*3/uL — ABNORMAL HIGH (ref 0.1–1.0)
Monocytes Relative: 3 %
Neutro Abs: 40.8 10*3/uL — ABNORMAL HIGH (ref 1.7–7.7)
Neutrophils Relative %: 97 %
Platelets: 215 10*3/uL (ref 150–400)
RBC: 3.09 MIL/uL — ABNORMAL LOW (ref 3.87–5.11)
RDW: 18.6 % — ABNORMAL HIGH (ref 11.5–15.5)
WBC: 42.1 10*3/uL — ABNORMAL HIGH (ref 4.0–10.5)
nRBC: 0 % (ref 0.0–0.2)
nRBC: 0 /100 WBC

## 2021-10-16 LAB — COMPREHENSIVE METABOLIC PANEL
ALT: 28 U/L (ref 0–44)
AST: 65 U/L — ABNORMAL HIGH (ref 15–41)
Albumin: 1.9 g/dL — ABNORMAL LOW (ref 3.5–5.0)
Alkaline Phosphatase: 144 U/L — ABNORMAL HIGH (ref 38–126)
Anion gap: 12 (ref 5–15)
BUN: 141 mg/dL — ABNORMAL HIGH (ref 8–23)
CO2: 15 mmol/L — ABNORMAL LOW (ref 22–32)
Calcium: 7.8 mg/dL — ABNORMAL LOW (ref 8.9–10.3)
Chloride: 93 mmol/L — ABNORMAL LOW (ref 98–111)
Creatinine, Ser: 8.24 mg/dL — ABNORMAL HIGH (ref 0.44–1.00)
GFR, Estimated: 5 mL/min — ABNORMAL LOW (ref >60)
Glucose, Bld: 181 mg/dL — ABNORMAL HIGH (ref 70–99)
Potassium: 6.6 mmol/L (ref 3.5–5.1)
Sodium: 120 mmol/L — ABNORMAL LOW (ref 135–145)
Total Bilirubin: 0.7 mg/dL (ref 0.3–1.2)
Total Protein: 7 g/dL (ref 6.5–8.1)

## 2021-10-16 LAB — LACTIC ACID, PLASMA: Lactic Acid, Venous: 1.1 mmol/L (ref 0.5–1.9)

## 2021-10-16 LAB — LIPASE, BLOOD: Lipase: 42 U/L (ref 11–51)

## 2021-10-16 LAB — RESP PANEL BY RT-PCR (FLU A&B, COVID) ARPGX2
Influenza A by PCR: NEGATIVE
Influenza B by PCR: NEGATIVE
SARS Coronavirus 2 by RT PCR: NEGATIVE

## 2021-10-16 MED ORDER — SODIUM CHLORIDE 0.9 % IV BOLUS
2500.0000 mL | Freq: Once | INTRAVENOUS | Status: AC
  Administered 2021-10-16: 2500 mL via INTRAVENOUS

## 2021-10-16 MED ORDER — SODIUM CHLORIDE 0.9 % IV BOLUS
1000.0000 mL | Freq: Once | INTRAVENOUS | Status: AC
  Administered 2021-10-16: 20:00:00 1000 mL via INTRAVENOUS

## 2021-10-16 MED ORDER — SODIUM CHLORIDE 0.9 % IV SOLN
1.0000 g | INTRAVENOUS | Status: DC
  Administered 2021-10-16 – 2021-10-19 (×3): 1 g via INTRAVENOUS
  Filled 2021-10-16 (×6): qty 1

## 2021-10-16 MED ORDER — CLINDAMYCIN PHOSPHATE 600 MG/50ML IV SOLN
600.0000 mg | Freq: Once | INTRAVENOUS | Status: AC
  Administered 2021-10-16: 20:00:00 600 mg via INTRAVENOUS
  Filled 2021-10-16: qty 50

## 2021-10-16 MED ORDER — SODIUM ZIRCONIUM CYCLOSILICATE 10 G PO PACK
10.0000 g | PACK | Freq: Three times a day (TID) | ORAL | Status: AC
  Administered 2021-10-17 (×3): 10 g via ORAL
  Filled 2021-10-16 (×3): qty 1

## 2021-10-16 MED ORDER — SODIUM BICARBONATE 8.4 % IV SOLN
INTRAVENOUS | Status: DC
  Filled 2021-10-16 (×4): qty 1000

## 2021-10-16 MED ORDER — SODIUM CHLORIDE 0.9 % IV SOLN
8.0000 mg/kg | INTRAVENOUS | Status: DC
  Filled 2021-10-16: qty 12

## 2021-10-16 MED ORDER — CIPROFLOXACIN IN D5W 400 MG/200ML IV SOLN
400.0000 mg | Freq: Once | INTRAVENOUS | Status: AC
  Administered 2021-10-16: 21:00:00 400 mg via INTRAVENOUS
  Filled 2021-10-16: qty 200

## 2021-10-16 MED ORDER — SODIUM CHLORIDE 0.9 % IV SOLN
8.0000 mg/kg | Freq: Once | INTRAVENOUS | Status: AC
  Administered 2021-10-17: 02:00:00 600 mg via INTRAVENOUS
  Filled 2021-10-16: qty 12

## 2021-10-16 MED ORDER — SODIUM BICARBONATE 8.4 % IV SOLN
50.0000 meq | Freq: Once | INTRAVENOUS | Status: AC
  Administered 2021-10-16: 21:00:00 50 meq via INTRAVENOUS
  Filled 2021-10-16: qty 50

## 2021-10-16 MED ORDER — SODIUM ZIRCONIUM CYCLOSILICATE 10 G PO PACK
10.0000 g | PACK | Freq: Once | ORAL | Status: AC
  Administered 2021-10-16: 21:00:00 10 g via ORAL
  Filled 2021-10-16: qty 1

## 2021-10-16 MED ORDER — VANCOMYCIN HCL 1500 MG/300ML IV SOLN
1500.0000 mg | Freq: Once | INTRAVENOUS | Status: DC
  Filled 2021-10-16: qty 300

## 2021-10-16 MED ORDER — INSULIN ASPART 100 UNIT/ML IV SOLN
6.0000 [IU] | Freq: Once | INTRAVENOUS | Status: AC
  Administered 2021-10-16: 21:00:00 6 [IU] via INTRAVENOUS

## 2021-10-16 MED ORDER — CALCIUM GLUCONATE 10 % IV SOLN
1.0000 g | Freq: Once | INTRAVENOUS | Status: AC
  Administered 2021-10-16: 21:00:00 1 g via INTRAVENOUS
  Filled 2021-10-16: qty 10

## 2021-10-16 MED ORDER — DEXTROSE 50 % IV SOLN
25.0000 g | Freq: Once | INTRAVENOUS | Status: AC
  Administered 2021-10-16: 21:00:00 25 g via INTRAVENOUS
  Filled 2021-10-16: qty 50

## 2021-10-16 MED ORDER — SODIUM CHLORIDE 0.9 % IV SOLN: INTRAVENOUS | Status: DC

## 2021-10-16 NOTE — ED Provider Notes (Addendum)
Signed out by Dr Rogene Houston at 2120 that patient with osteo/cellulits/diabetic foot infection, AKI, high K, low Na, and that he is awaiting call back from hospitalists for admission.  Discussed w hospitalist - will admit. They request renal and ortho consults - called, and MRI right foot - ordered.   Discussed pt, labs, etc. With Dr Jonnie Finner from renal - he will see in consult.   DIscussed pt, radiology impression specifically of right foot films, exam, labs with Dr Alvan Dame (ortho) - he agrees with MR, and states someone from his service or Dr Sharol Given will see patient in AM in consult.   On recheck pt, pt awake, no resp distress/breathing comfortably. Bp is normal. No crepitus of feet/lower legs, +foot ulcers with associated cellulitis of foot/lower leg.       Lajean Saver, MD 10/16/21 2140

## 2021-10-16 NOTE — ED Notes (Signed)
Patient transported to CT 

## 2021-10-16 NOTE — ED Notes (Signed)
Potassium 6.6, no hemolysis Critical value- communicated to MD [facility]
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
## 2021-10-16 NOTE — Progress Notes (Signed)
Pharmacy Antibiotic Note  [name] [name] is a 65 y.o. adult admitted on 10/16/2021 with diabetic foot wound, cellulitis, possible osteo.  Patient also noted to have acute on chronic renal disease with SCr 8 (baseline 3).  Pharmacy has been consulted for Cefepime and Daptomycin dosing.  Plan for ID consult in the AM for formal Daptomycin approval.  Plan: Daptomycin 657m IV q48h (840mkg using TBW) Cefepime 1gm IV q24h Follow-up ID consult, ortho consult, renal consult Ordered CK with AM labs.  Consider D/C statin if Daptomycin therapy to continue.  Weight: 75.8 kg (167 lb)  Temp (24hrs), Avg:98.3 F (36.8 C), Min:98.3 F (36.8 C), Max:98.3 F (36.8 C)  Recent Labs  Lab 10/16/21 1956  WBC 42.1*  CREATININE 8.24*  LATICACIDVEN 1.1    Estimated Creatinine Clearance (by C-G formula based on SCr of 8.24 mg/dL (H)) Female: 6.9 mL/min (A) Female: 8.5 mL/min (A)    Allergies  Allergen Reactions   Bee Pollen Anaphylaxis   Fish-Derived Products Hives, Shortness Of Breath, Swelling and Rash    Hives get in throat causing trouble breathing   Mushroom Extract Complex Anaphylaxis   Penicillins Anaphylaxis    **Tolerated cefepime March 2021 Did it involve swelling of the face/tongue/throat, SOB, or low BP? Yes Did it involve sudden or severe rash/hives, skin peeling, or any reaction on the inside of your mouth or nose? No Did you need to seek medical attention at a hospital or doctor's office? Yes When did it last happen?  A few months ago If all above answers are "NO", may proceed with cephalosporin use.   Rosemary Oil Anaphylaxis   Shellfish Allergy Hives, Shortness Of Breath, Swelling and Rash   Tomato Hives and Shortness Of Breath    Hives in throat causes her trouble breathing   Acetaminophen Other (See Comments)    GI upset   Aloe Vera Hives   Broccoli [Brassica Oleracea] Hives   Acyclovir And Related Other (See Comments)    Unknown reaction   Naproxen Other (See Comments)     Unknown reaction    Antimicrobials this admission: Cipro x 1 in ED Clinda x 1 in ED Dapto 12/18 >> Cefepime 12/18 >>   Dose adjustments this admission:  Microbiology results: 12/18 BCx:    Thank you for allowing pharmacy to be a part of this patients care.  Ki[employer] IncPharm.D., BCPS Clinical Pharmacist  **Pharmacist phone directory can be found on [email or web address] listed under [email or web address] 10/16/2021 10:42 PM

## 2021-10-16 NOTE — ED Notes (Signed)
Pt returnj from MRI

## 2021-10-16 NOTE — ED Provider Notes (Addendum)
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 No definite solid intrahepatic mass identified. No intrahepatic biliary ductal dilation. There is mild extrahepatic biliary ductal dilation, progressive since prior examination, with the extrahepatic bile duct measuring up to 10 mm in diameter. Pancreas: Unremarkable Spleen: The spleen is mildly enlarged measuring 14.1 cm in greatest dimension, stable since prior examination. No intrasplenic masses are seen. Adrenals/Urinary Tract: Adrenal glands are unremarkable. Kidneys are normal, without renal calculi, focal lesion, or hydronephrosis. Bladder is unremarkable. Stomach/Bowel: Stomach is within normal limits. Appendix appears normal. No evidence of bowel wall thickening, distention, or inflammatory changes. Vascular/Lymphatic: Aortic atherosclerosis. No enlarged abdominal or pelvic lymph nodes. Reproductive: Status post hysterectomy. No adnexal masses. Other: Tiny fat containing umbilical hernia.  No ascites. Musculoskeletal: L1  corpectomy and T11-L3 posterior lumbar fusion with instrumentation has been performed. No definite bridging callus or incorporation of interbody bone graft is identified along the fused segment. Additionally, there is periprosthetic lucency involving the pedicle screws of T11, L2, and L3 as well as fracture of the left pedicle screw of T11 and migration of the proximal aspects of the T11 pedicle screws into the T10-11 disc space. Together, the findings suggest motion along the fused segment. No acute fracture identified. IMPRESSION: Acute mildly displaced fractures of the left ninth and tenth ribs posteriorly. No pneumothorax. Extensive multi-vessel coronary artery calcification. Cirrhosis. 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 L1 corpectomy and T11-L3 posterior lumbar fusion procedure with findings suggesting motion along the fused segment as noted above. This may be better assessed with dedicated CT imaging, if indicated. Aortic Atherosclerosis (ICD10-I70.0). Electronically Signed   By: Fidela Salisbury M.D.   On: 10/16/2021 19:50    Procedures Procedures   Medications Ordered in ED Medications  0.9 %  sodium chloride infusion (has no administration in time range)  sodium chloride 0.9 % bolus 1,000 mL (has no administration in time range)    ED Course  I have reviewed the triage vital signs and the nursing notes.  Pertinent labs & imaging results that were available during my care of the patient were reviewed by me and considered in my medical decision making (see chart for details).    MDM Rules/Calculators/A&P                          CRITICAL CARE Performed by: Fredia Sorrow Total critical care time: 60 minutes Critical care time was exclusive of separately billable procedures and treating other patients. Critical care was necessary to treat or prevent imminent or life-threatening deterioration. Critical care was time spent personally by me on the  following activities: development of treatment plan with patient and/or surrogate as well as nursing, discussions with consultants, evaluation of patient's response to  treatment, examination of patient, obtaining history from patient or surrogate, ordering and performing treatments and interventions, ordering and review of laboratory studies, ordering and review of radiographic studies, pulse oximetry and re-evaluation of patient's condition.   Patient will get a broad work-up.  There is the trauma fall.  Appears the patient has a significant shellfish allergy.  Patient without fever here but that could be of concern.  Patient has bilateral wounds to the foot.  Peers to be a degree of cellulitis to the right foot and distal right leg.  Patient's blood pressures here seem to be reasonable.  Were getting systolics as high as 035.  Not hypotensive.  Patient's mucous brains are very dry here.  Patient clinically appears to be very dry.  To be interesting to see what her renal function is like.  Labs still pending.  But CT and x-rays are back.  And they are showing concerns for significant diabetic foot infections bilaterally.  The left foot heel ulcer raising concerns for osteomyelitis.  The right forefoot infection is showing some evidence of gas and raising some concern for significant infection there.  Based on this and patient's severe penicillin allergy she will be started on vancomycin Cipro and clindamycin IV.  Still possibility of significant dehydration and perhaps acute kidney injury.  Labs important for that COVID and influenza also pending.  CT head neck without any acute findings.  CT chest and chest x-ray showed evidence of ninth and 10th left-sided rib fractures without any underlying lung abnormalities.  CT of the abdomen without any acute findings.  Some question of may be some laxity of the lumbar previous repair.  But probably not clinically significant for today.  Patient's blood  pressures had improved we need to continue to follow them.  Patient has not received any IV fluid since she has been here.  Patient was brought back with 2 L of oxygen.  But currently not on.  The patient not being monitored.  Have spoken with nurse and work on to get these things back in place and get things moving along.  Inappropriate at this time to call for admission because we have no labs at all.  Patient CBC significant for white count of 42,000.  Hemoglobin 8.2.  Patient's been in the 8 range in the past.  Most recently 9.  Type and screen done.   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

## 2021-10-16 NOTE — ED Notes (Signed)
Pt transported to MRI 

## 2021-10-16 NOTE — ED Provider Notes (Signed)
Emergency Medicine Provider Triage Evaluation Note  [name] [name] , a 65 y.o. adult  was evaluated in triage.  Family reports pt is less responsive today  Review of Systems  Positive: weakness Negative: fever  Physical Exam  BP (!) 112/57 (BP Location: Right Arm)    Pulse 75    Temp 98.3 F (36.8 C) (Oral)    Resp 20    SpO2 100%  Gen:   Awake, starring off  Resp:  Normal effort  MSK:   Moves extremities without difficulty  Other:    Medical Decision Making  Medically screening exam initiated at 5:07 PM.  Appropriate orders placed.  [name] [name] was informed that the remainder of the evaluation will be completed by another provider, this initial triage assessment does not replace that evaluation, and the importance of remaining in the ED until their evaluation is complete.     [name] [name], [place] 10/16/21 1708    [name] Sorrow, MD 10/19/21 1332

## 2021-10-16 NOTE — ED Triage Notes (Signed)
Patient arrived by [facility] from home with increasing weakness the past 7-10 days. Patient normally ambulatory and per spouse has become bed bound the past week. Fell last night and stayed on floor all night. Pale with abdominal distention. CBG 173, 90/50, hr 78

## 2021-10-16 NOTE — H&P (Signed)
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 pulmonary arteries are of normal caliber. Mild atherosclerotic calcification within the thoracic aorta. No aortic aneurysm. Mediastinum/Nodes: No enlarged mediastinal, hilar, or axillary lymph nodes. Thyroid gland, trachea, and esophagus demonstrate no significant findings. Lungs/Pleura: Mild bibasilar atelectasis. Minimal left basilar pleural thickening superficial to acute left posterior rib fractures. The lungs are otherwise clear. No pneumothorax or pleural effusion. Central airways are widely patent. Musculoskeletal: There are acute mildly displaced fractures of the left ninth and tenth ribs posteriorly. CT ABDOMEN PELVIS FINDINGS Hepatobiliary: The liver contour is nodular in keeping with changes of cirrhosis. 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 No definite solid intrahepatic mass identified. No intrahepatic biliary ductal dilation. There is mild  extrahepatic biliary ductal dilation, progressive since prior examination, with the extrahepatic bile duct measuring up to 10 mm in diameter. Pancreas: Unremarkable Spleen: The spleen is mildly enlarged measuring 14.1 cm in greatest dimension, stable since prior examination. No intrasplenic masses are seen. Adrenals/Urinary Tract: Adrenal glands are unremarkable. Kidneys are normal, without renal calculi, focal lesion, or hydronephrosis. Bladder is unremarkable. Stomach/Bowel: Stomach is within normal limits. Appendix appears normal. No evidence of bowel wall thickening, distention, or inflammatory changes. Vascular/Lymphatic: Aortic atherosclerosis. No enlarged abdominal or pelvic lymph nodes. Reproductive: Status post hysterectomy. No adnexal masses. Other: Tiny fat containing umbilical hernia.  No ascites. Musculoskeletal: L1 corpectomy and T11-L3 posterior lumbar fusion with instrumentation has been performed. No definite bridging callus or incorporation of interbody bone graft is identified along the fused segment. Additionally, there is periprosthetic lucency involving the pedicle screws of T11, L2, and L3 as well as fracture of the left pedicle screw of T11 and migration of the proximal aspects of the T11 pedicle screws into the T10-11 disc space. Together, the findings suggest motion along the fused segment. No acute fracture identified. IMPRESSION: Acute mildly displaced fractures of the left ninth and tenth ribs posteriorly. No pneumothorax. Extensive multi-vessel coronary artery calcification. Cirrhosis. 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 L1 corpectomy and T11-L3 posterior lumbar fusion procedure with findings suggesting motion along the fused segment as noted above. This may be better assessed with dedicated CT imaging, if indicated. Aortic Atherosclerosis (ICD10-I70.0). Electronically Signed   By: Fidela Salisbury M.D.   On: 10/16/2021 19:50    EKG: Independently  reviewed.  EKG shows normal sinus rhythm with no acute ST elevation or depression.  QTc 447  Assessment/Plan Principal Problem:   Osteomyelitis of left foot [name] [name] is admitted to Progressive care bed.  Started on Daptomycin and Cefepime for antibiotic coverage. Consulted with pharmacy on most appropriate antibiotic course. Will [facility] pharmacy did not recommend the cipro, clindamycin and vancomycin that was ordered in the ER.  Will need ID consult in am Ortho consulted to evaluate and will see in am  Active Problems:   Cellulitis of right foot Antibiotic with Daptomycin and cefepime as above.  Obtain MRI right foot to further evaluate for possible abscess, osteomyelitis.     Acute kidney injury superimposed on CKD  Nephrology consulted and will see patient. Appreciate their assistance with this patients care    Hyponatremia Gentle IVF hydration. Pt appears dehydrated.  Check urine sodium and serum osmolarity Check electrolytes Q4 hours    Hyperkalemia Pt given insulin, D50, calcium gluconate and sodium bicarb in ER. No EKG changes of hyperkalemia noted.  Monitor electrolytes.     DM (diabetes mellitus), type 2 with renal complications Continue basal insulin. Monitor blood sugar with meals and bedtime. Corrective insulin provided for glycemic control.  Check HgbA1c    Leukocytosis Recheck CBC in am.    Diabetic foot ulcers Wound care consulted to evaluate in am.  Ortho to see in consultation    Generalized weakness Fall precautions.     DVT prophylaxis: Heparin for DVT prophylaxis.   Code Status:   Full Code  Family Communication:  Diagnosis and plan discussed with patient.  Verbalizes understanding and agrees with plan.  Further recommendations to follow as clinically indicated Disposition Plan:   Patient is from:  Home  Anticipated DC to:  To be determined  Anticipated DC date:  Anticipate 2 midnight or more stay in hospital   Consults called:  Orthopedics and  Nephrology consulted by ER physician  Admission status:  Inpatient  Yevonne Aline Kaleigh Spiegelman MD [facility]  How to contact the [facility] Attending or Consulting provider [place] or covering provider during after hours [place], for this patient?   Check the care team in [facility] and look for a) attending/consulting [facility] provider listed and b) the [facility] team listed Log into [email or web address] and use [place]'s universal password to access. If you do not have the password, please contact the hospital operator. Locate the [facility] provider you are looking for under [facility] and page to a number that you can be directly reached. If you still have difficulty reaching the provider, please page the [facility] (Director on Call) for the Hospitalists listed on amion for assistance.  10/16/2021, 10:18 PM

## 2021-10-16 NOTE — ED Triage Notes (Signed)
Pt with AMS x 2 weeks.  Reports back pain.  Fall 1 week ago.  Charge RN notified pt needs room.  Lethargic.

## 2021-10-17 DIAGNOSIS — E11621 Type 2 diabetes mellitus with foot ulcer: Secondary | ICD-10-CM

## 2021-10-17 DIAGNOSIS — N179 Acute kidney failure, unspecified: Secondary | ICD-10-CM

## 2021-10-17 DIAGNOSIS — Z794 Long term (current) use of insulin: Secondary | ICD-10-CM

## 2021-10-17 DIAGNOSIS — L97424 Non-pressure chronic ulcer of left heel and midfoot with necrosis of bone: Secondary | ICD-10-CM

## 2021-10-17 DIAGNOSIS — N189 Chronic kidney disease, unspecified: Secondary | ICD-10-CM

## 2021-10-17 DIAGNOSIS — L97414 Non-pressure chronic ulcer of right heel and midfoot with necrosis of bone: Secondary | ICD-10-CM

## 2021-10-17 DIAGNOSIS — L089 Local infection of the skin and subcutaneous tissue, unspecified: Secondary | ICD-10-CM

## 2021-10-17 DIAGNOSIS — A419 Sepsis, unspecified organism: Secondary | ICD-10-CM | POA: Insufficient documentation

## 2021-10-17 DIAGNOSIS — E1122 Type 2 diabetes mellitus with diabetic chronic kidney disease: Secondary | ICD-10-CM

## 2021-10-17 DIAGNOSIS — E875 Hyperkalemia: Secondary | ICD-10-CM

## 2021-10-17 DIAGNOSIS — E871 Hypo-osmolality and hyponatremia: Secondary | ICD-10-CM

## 2021-10-17 DIAGNOSIS — R6520 Severe sepsis without septic shock: Secondary | ICD-10-CM | POA: Insufficient documentation

## 2021-10-17 DIAGNOSIS — E43 Unspecified severe protein-calorie malnutrition: Secondary | ICD-10-CM | POA: Insufficient documentation

## 2021-10-17 DIAGNOSIS — E11628 Type 2 diabetes mellitus with other skin complications: Secondary | ICD-10-CM

## 2021-10-17 DIAGNOSIS — L03115 Cellulitis of right lower limb: Secondary | ICD-10-CM

## 2021-10-17 DIAGNOSIS — N185 Chronic kidney disease, stage 5: Secondary | ICD-10-CM

## 2021-10-17 LAB — COMPREHENSIVE METABOLIC PANEL
ALT: 25 U/L (ref 0–44)
AST: 63 U/L — ABNORMAL HIGH (ref 15–41)
Albumin: 1.6 g/dL — ABNORMAL LOW (ref 3.5–5.0)
Alkaline Phosphatase: 126 U/L (ref 38–126)
Anion gap: 10 (ref 5–15)
BUN: 135 mg/dL — ABNORMAL HIGH (ref 8–23)
CO2: 18 mmol/L — ABNORMAL LOW (ref 22–32)
Calcium: 7 mg/dL — ABNORMAL LOW (ref 8.9–10.3)
Chloride: 97 mmol/L — ABNORMAL LOW (ref 98–111)
Creatinine, Ser: 7.05 mg/dL — ABNORMAL HIGH (ref 0.44–1.00)
GFR, Estimated: 6 mL/min — ABNORMAL LOW (ref >60)
Glucose, Bld: 153 mg/dL — ABNORMAL HIGH (ref 70–99)
Potassium: 5.4 mmol/L — ABNORMAL HIGH (ref 3.5–5.1)
Sodium: 125 mmol/L — ABNORMAL LOW (ref 135–145)
Total Bilirubin: 1 mg/dL (ref 0.3–1.2)
Total Protein: 6.1 g/dL — ABNORMAL LOW (ref 6.5–8.1)

## 2021-10-17 LAB — OSMOLALITY: Osmolality: 317 mOsm/kg — ABNORMAL HIGH (ref 275–295)

## 2021-10-17 LAB — BLOOD CULTURE ID PANEL (REFLEXED) - BCID2
A.calcoaceticus-baumannii: NOT DETECTED
Bacteroides fragilis: NOT DETECTED
Candida albicans: NOT DETECTED
Candida auris: NOT DETECTED
Candida glabrata: NOT DETECTED
Candida krusei: NOT DETECTED
Candida parapsilosis: NOT DETECTED
Candida tropicalis: NOT DETECTED
Cryptococcus neoformans/gattii: NOT DETECTED
Enterobacter cloacae complex: NOT DETECTED
Enterobacterales: NOT DETECTED
Enterococcus Faecium: NOT DETECTED
Enterococcus faecalis: NOT DETECTED
Escherichia coli: NOT DETECTED
Haemophilus influenzae: NOT DETECTED
Klebsiella aerogenes: NOT DETECTED
Klebsiella oxytoca: NOT DETECTED
Klebsiella pneumoniae: NOT DETECTED
Listeria monocytogenes: NOT DETECTED
Neisseria meningitidis: NOT DETECTED
Proteus species: NOT DETECTED
Pseudomonas aeruginosa: NOT DETECTED
Salmonella species: NOT DETECTED
Serratia marcescens: NOT DETECTED
Staphylococcus aureus (BCID): NOT DETECTED
Staphylococcus epidermidis: NOT DETECTED
Staphylococcus lugdunensis: NOT DETECTED
Staphylococcus species: NOT DETECTED
Stenotrophomonas maltophilia: NOT DETECTED
Streptococcus agalactiae: NOT DETECTED
Streptococcus pneumoniae: NOT DETECTED
Streptococcus pyogenes: NOT DETECTED
Streptococcus species: DETECTED — AB

## 2021-10-17 LAB — URINALYSIS, MICROSCOPIC (REFLEX): RBC / HPF: >50 RBC/hpf (ref 0–5)

## 2021-10-17 LAB — URINALYSIS, ROUTINE W REFLEX MICROSCOPIC
Bilirubin Urine: NEGATIVE
Glucose, UA: NEGATIVE mg/dL
Ketones, ur: NEGATIVE mg/dL
Leukocytes,Ua: NEGATIVE
Nitrite: NEGATIVE
Protein, ur: NEGATIVE mg/dL
Specific Gravity, Urine: 1.02 (ref 1.005–1.030)
pH: 5 (ref 5.0–8.0)

## 2021-10-17 LAB — BASIC METABOLIC PANEL
Anion gap: 11 (ref 5–15)
BUN: 129 mg/dL — ABNORMAL HIGH (ref 8–23)
CO2: 20 mmol/L — ABNORMAL LOW (ref 22–32)
Calcium: 7.1 mg/dL — ABNORMAL LOW (ref 8.9–10.3)
Chloride: 94 mmol/L — ABNORMAL LOW (ref 98–111)
Creatinine, Ser: 6.55 mg/dL — ABNORMAL HIGH (ref 0.44–1.00)
GFR, Estimated: 7 mL/min — ABNORMAL LOW (ref >60)
Glucose, Bld: 139 mg/dL — ABNORMAL HIGH (ref 70–99)
Potassium: 4.4 mmol/L (ref 3.5–5.1)
Sodium: 125 mmol/L — ABNORMAL LOW (ref 135–145)

## 2021-10-17 LAB — HEPARIN LEVEL (UNFRACTIONATED)
Heparin Unfractionated: <0.1 IU/mL — ABNORMAL LOW (ref 0.30–0.70)
Heparin Unfractionated: <0.1 IU/mL — ABNORMAL LOW (ref 0.30–0.70)
Heparin Unfractionated: <0.1 IU/mL — ABNORMAL LOW (ref 0.30–0.70)

## 2021-10-17 LAB — APTT
aPTT: 31 seconds (ref 24–36)
aPTT: 43 seconds — ABNORMAL HIGH (ref 24–36)

## 2021-10-17 LAB — CBG MONITORING, ED
Glucose-Capillary: 147 mg/dL — ABNORMAL HIGH (ref 70–99)
Glucose-Capillary: 156 mg/dL — ABNORMAL HIGH (ref 70–99)

## 2021-10-17 LAB — CREATININE, URINE, RANDOM: Creatinine, Urine: 123.28 mg/dL

## 2021-10-17 LAB — MAGNESIUM: Magnesium: 2 mg/dL (ref 1.7–2.4)

## 2021-10-17 LAB — SODIUM, URINE, RANDOM: Sodium, Ur: <10 mmol/L

## 2021-10-17 LAB — HIV ANTIBODY (ROUTINE TESTING W REFLEX): HIV Screen 4th Generation wRfx: NONREACTIVE

## 2021-10-17 LAB — HEMOGLOBIN A1C
Hgb A1c MFr Bld: 7.1 % — ABNORMAL HIGH (ref 4.8–5.6)
Mean Plasma Glucose: 157.07 mg/dL

## 2021-10-17 LAB — MRSA NEXT GEN BY PCR, NASAL: MRSA by PCR Next Gen: NOT DETECTED

## 2021-10-17 LAB — PROTIME-INR
INR: 1.4 — ABNORMAL HIGH (ref 0.8–1.2)
Prothrombin Time: 16.7 seconds — ABNORMAL HIGH (ref 11.4–15.2)

## 2021-10-17 LAB — GLUCOSE, CAPILLARY: Glucose-Capillary: 151 mg/dL — ABNORMAL HIGH (ref 70–99)

## 2021-10-17 LAB — CK: Total CK: 697 U/L — ABNORMAL HIGH (ref 38–234)

## 2021-10-17 LAB — PHOSPHORUS: Phosphorus: 8.3 mg/dL — ABNORMAL HIGH (ref 2.5–4.6)

## 2021-10-17 MED ORDER — INSULIN ASPART 100 UNIT/ML IJ SOLN: 0.0000 [IU] | Freq: Three times a day (TID) | INTRAMUSCULAR | Status: DC

## 2021-10-17 MED ORDER — PANTOPRAZOLE SODIUM 40 MG PO TBEC
40.0000 mg | DELAYED_RELEASE_TABLET | Freq: Every day | ORAL | Status: DC
  Administered 2021-10-17 – 2021-10-22 (×5): 40 mg via ORAL
  Filled 2021-10-17 (×5): qty 1

## 2021-10-17 MED ORDER — HEPARIN (PORCINE) 25000 UT/250ML-% IV SOLN
1850.0000 [IU]/h | INTRAVENOUS | Status: AC
  Administered 2021-10-17: 15:00:00 1300 [IU]/h via INTRAVENOUS
  Administered 2021-10-18: 21:00:00 1750 [IU]/h via INTRAVENOUS
  Administered 2021-10-18: 10:00:00 1550 [IU]/h via INTRAVENOUS
  Filled 2021-10-17 (×3): qty 250

## 2021-10-17 MED ORDER — ONDANSETRON HCL 4 MG PO TABS
4.0000 mg | ORAL_TABLET | Freq: Four times a day (QID) | ORAL | Status: DC | PRN
  Administered 2021-10-20: 18:00:00 4 mg via ORAL
  Filled 2021-10-17 (×2): qty 1

## 2021-10-17 MED ORDER — ONDANSETRON HCL 4 MG/2ML IJ SOLN
4.0000 mg | Freq: Four times a day (QID) | INTRAMUSCULAR | Status: DC | PRN
  Administered 2021-10-20 – 2021-10-22 (×3): 4 mg via INTRAVENOUS
  Filled 2021-10-17 (×3): qty 2

## 2021-10-17 MED ORDER — ATORVASTATIN CALCIUM 10 MG PO TABS
10.0000 mg | ORAL_TABLET | Freq: Every day | ORAL | Status: DC
  Administered 2021-10-17: 11:00:00 10 mg via ORAL
  Filled 2021-10-17: qty 1

## 2021-10-17 MED ORDER — FOLIC ACID 1 MG PO TABS
2.0000 mg | ORAL_TABLET | Freq: Every day | ORAL | Status: DC
  Administered 2021-10-17 – 2021-10-22 (×5): 2 mg via ORAL
  Filled 2021-10-17 (×5): qty 2

## 2021-10-17 MED ORDER — CHOLECALCIFEROL 10 MCG (400 UNIT) PO TABS
400.0000 [IU] | ORAL_TABLET | Freq: Every day | ORAL | Status: DC
  Administered 2021-10-19 – 2021-10-22 (×4): 400 [IU] via ORAL
  Filled 2021-10-17 (×9): qty 1

## 2021-10-17 MED ORDER — SENNOSIDES-DOCUSATE SODIUM 8.6-50 MG PO TABS: 1.0000 | ORAL_TABLET | Freq: Every evening | ORAL | Status: DC | PRN

## 2021-10-17 MED ORDER — LACTATED RINGERS IV SOLN: INTRAVENOUS | Status: DC

## 2021-10-17 MED ORDER — INSULIN ASPART 100 UNIT/ML IJ SOLN
0.0000 [IU] | Freq: Four times a day (QID) | INTRAMUSCULAR | Status: DC
  Administered 2021-10-17: 23:00:00 1 [IU] via SUBCUTANEOUS

## 2021-10-17 MED ORDER — HYDROMORPHONE HCL 1 MG/ML IJ SOLN
0.5000 mg | INTRAMUSCULAR | Status: DC | PRN
  Administered 2021-10-17 (×2): 0.5 mg via INTRAVENOUS
  Filled 2021-10-17 (×2): qty 1

## 2021-10-17 MED ORDER — TRAMADOL HCL 50 MG PO TABS
50.0000 mg | ORAL_TABLET | Freq: Two times a day (BID) | ORAL | Status: DC | PRN
  Administered 2021-10-17 – 2021-10-19 (×3): 50 mg via ORAL
  Filled 2021-10-17 (×3): qty 1

## 2021-10-17 MED ORDER — ACETAMINOPHEN 325 MG PO TABS
650.0000 mg | ORAL_TABLET | Freq: Four times a day (QID) | ORAL | Status: DC | PRN
  Administered 2021-10-19 – 2021-10-22 (×3): 650 mg via ORAL
  Filled 2021-10-17 (×2): qty 2

## 2021-10-17 MED ORDER — HEPARIN SODIUM (PORCINE) 5000 UNIT/ML IJ SOLN
5000.0000 [IU] | Freq: Three times a day (TID) | INTRAMUSCULAR | Status: DC
  Administered 2021-10-17: 06:00:00 5000 [IU] via SUBCUTANEOUS
  Filled 2021-10-17: qty 1

## 2021-10-17 MED ORDER — HYDROMORPHONE HCL 1 MG/ML IJ SOLN
0.5000 mg | Freq: Three times a day (TID) | INTRAMUSCULAR | Status: DC | PRN
  Administered 2021-10-17 – 2021-10-18 (×4): 0.5 mg via INTRAVENOUS
  Filled 2021-10-17 (×5): qty 0.5

## 2021-10-17 MED ORDER — INSULIN GLARGINE-YFGN 100 UNIT/ML ~~LOC~~ SOLN
20.0000 [IU] | Freq: Every day | SUBCUTANEOUS | Status: DC
  Administered 2021-10-17 – 2021-10-18 (×2): 20 [IU] via SUBCUTANEOUS
  Filled 2021-10-17 (×3): qty 0.2

## 2021-10-17 NOTE — Consult Note (Signed)
[facility] for [facility]ious Disease    Date of Admission:  10/16/2021     Total days of antibiotics 2               Reason for Consult: Osteomyelitis   Referring Provider: Sharol Given Primary Care Provider: Carollee Herter, Alferd Apa, DO   ASSESSMENT:  [name] [name] is a 65 y/o caucasian female with poorly controlled Type 2 diabetes complicated by neuropathy arriving to the hospital with worsening generalized weakness and found to have chronic osteomyelitis of the left foot and acute osteomyelitis and abscess of the right foot. Course complicated with acute on chronic kidney injury. Orthopedic Surgery recommending bilateral transtibial amputation. Discussed with [name] [name] that I agree with Dr. Sharol Given that antibiotics alone would not be able resolve this infection and surgery is the best course of action. Continue with broad spectrum coverage with daptomycin and cefepime. Monitor CK levels while on Daptomycin. Continue to monitor blood cultures. Consider Neurosurgery review for lucency and fracture of pedicle screws. Nephrology following for AKI. Most recent A1c's have been improved with latest of 7.1. Continue remaining medical and supportive care per primary team. ID will continue to follow pending surgery.   PLAN:  Continue daptomycin and cefepime. Monitor CK levels per protocol Monitor blood cultures for bacteremia and narrow antibiotics as able. Pending bilateral transtibial amputation per Orthopedic Surgery. Consider Neurosurgery evaluation of pedicle screws.  AKI per Nephrology. Remaining medical and supportive care per primary team.    Principal Problem:   Osteomyelitis of left foot (HCC) Active Problems:   Essential hypertension   Cellulitis of right foot   Hyponatremia   Hyperkalemia   Acute kidney injury superimposed on CKD (HCC)   Leukocytosis   Diabetic foot ulcers (HCC)   DM (diabetes mellitus), type 2 with renal complications (HCC)   Generalized weakness     atorvastatin  10 mg Oral Daily   cholecalciferol  400 Units Oral Daily   folic acid  2 mg Oral Daily   insulin aspart  0-6 Units Subcutaneous Q6H   insulin glargine-yfgn  20 Units Subcutaneous Q2200   pantoprazole  40 mg Oral Daily   sodium zirconium cyclosilicate  10 g Oral TID     HPI: [name] [name] is a 65 y.o. adult previous medical history of poorly controlled Type 2 diabetes complicated by neuropathy, hypertension, and chronic kidney disease s/p renal transplant in 1972, presenting to the ED with increasing weakness.  [name] [name] experienced a fall approximately 1 week ago and had been having increasing weakness. Noted to have bilateral diabetic ulcers . CT head and cervical spine with no acute abnormalities. CT chest with acute mildly displaced fractures of the ninth and tenth ribs posteriorly; cirrhosis, periprosthetic lucency involving pedicle screws T11, L2, and L3 and fracture of left pedicle screw of T11. X-ray right foot with soft tissue ulceration involving the lateral, plantar, right midfoot; and subcutaneous gas within the right forefoot and midfoot remote from ulcer. X-ray left foot with enlarging, large soft tissue ulcer involving the plantar aspect of the left hindfoot with subadjacent sclerosis and subtle periosteal reaction of the calcaneus suspicious for osteomyelitis. MRI right foot with acute osteomyelitis of the fourth metatarsal base and proximal diaphysis and adjacent cuboid with fluid within the fourth TMT joint suggesting septic arthritis; Soft tissue ulcerations at the dorsolateral aspect of the forefoot overlying the level of the fourth metatarsal diaphysis. Additional deep soft tissue ulceration along the plantar aspect of the forefoot underlying the  level of the fourth metatarsal base with tract extending to the underlying bone. Two small abscesses within the soft tissues near the level of the fourth TMT joint measuring up to 1.3 cm; and Numerous foci of  susceptibility within the soft tissues in the region of the midfoot compatible with subcutaneous emphysema seen radiographically.  Afebrile since admission with leukocytosis with WBC count of 42.1. Currently on broad spectrum coverage with Daptomycin (secondary to renal function) and cefepime. Currently with acute on chronic kidney injury with creatinine of 7.05 (baseline around 3.5). Blood cultures have been without growth to date. Seen by Dr. Sharol Given  with recommendation for bilateral  transtibial amputation.    Review of Systems: Review of Systems  Constitutional:  Positive for malaise/fatigue. Negative for chills, fever and weight loss.  Respiratory:  Negative for cough, shortness of breath and wheezing.   Cardiovascular:  Negative for chest pain and leg swelling.  Gastrointestinal:  Negative for abdominal pain, constipation, diarrhea, nausea and vomiting.  Musculoskeletal:        Positive for bilateral leg pain.   Skin:  Negative for rash.  Neurological:  Positive for weakness.    Past Medical History:  Diagnosis Date   Acute MI [facility]) 2007   presented to ED & had cardiac cath- but found to have normal coronaries. Since that point in time her PCP cares f or cardiac needs. Dr. 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    Exertional  shortness of breath    Fatty liver    Fibromyalgia    GERD (gastroesophageal reflux disease)    Hepatic steatosis    High cholesterol    History of MI (myocardial infarction) 10/06/2013   Hyperlipidemia 06/03/2008   Qualifier: Diagnosis of  By: Larose Kells MD, [place]    Hypertension    MRSA (methicillin resistant Staphylococcus aureus)    Neuropathy    lower legs   Osteoarthritis    hands, hips   Proximal humerus fracture 10/15/12   Left   PTSD (post-traumatic stress disorder)    Renal insufficiency 05/05/2015   Renal transplant, 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   Retinopathy 04/17/2019   Sleep apnea 11/16/2017   SOB (shortness of breath) 10/11/2020   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   Qualifier: Diagnosis of  By: Charlott Holler CMA, [place]     Type 2 diabetes mellitus with hyperglycemia, with long-term current use of insulin ([place]) 04/06/2017    Social History   Tobacco Use   Smoking status: Never   Smokeless tobacco: Never  Vaping Use   Vaping Use: Never used  Substance Use Topics   Alcohol use: No    Alcohol/week: 0.0 standard drinks   Drug use: No  Family History  Problem Relation Age of Onset   Heart disease Father    Diabetes Father    Colitis Father    Crohn's disease Father    Cancer Father        leukemia   Leukemia Father    Diabetes Mellitus II Brother    Kidney disease Brother    Heart disease Brother    Diabetes Mother    Hypertension Mother    Mental illness Mother    Irritable bowel syndrome Daughter    Diabetes Mellitus II Brother    Kidney disease Brother    Liver disease Brother    Kidney disease Brother    Heart attack Brother    Diabetes Mellitus II Brother    Heart disease Brother    Liver disease Brother    Kidney disease Brother    Kidney disease Brother    Diabetes Mellitus II Brother    Diabetes Mellitus I Brother     Allergies  Allergen Reactions   Bee Pollen Anaphylaxis   Fish-Derived Products Hives, Shortness Of Breath, Swelling and Rash    Hives get  in throat causing trouble breathing   Mushroom Extract Complex Anaphylaxis   Penicillins Anaphylaxis    **Tolerated cefepime March 2021 Did it involve swelling of the face/tongue/throat, SOB, or low BP? Yes Did it involve sudden or severe rash/hives, skin peeling, or any reaction on the inside of your mouth or nose? No Did you need to seek medical attention at a hospital or doctor's office? Yes When did it last happen?  A few months ago If all above answers are "NO", may proceed with cephalosporin use.   Rosemary Oil Anaphylaxis   Shellfish Allergy Hives, Shortness Of Breath, Swelling and Rash   Tomato Hives and Shortness Of Breath    Hives in throat causes her trouble breathing   Acetaminophen Other (See Comments)    GI upset   Aloe Vera Hives   Broccoli [Brassica Oleracea] Hives   Acyclovir And Related Other (See Comments)    Unknown reaction   Naproxen Other (See Comments)    Unknown reaction    OBJECTIVE: Blood pressure (!) 111/52, pulse 61, temperature 98.3 F (36.8 C), temperature source Oral, resp. rate 10, height 5' 5"  (1.651 m), weight 75.8 kg, SpO2 98 %.  Physical Exam Constitutional:      General: She is not in acute distress.    Appearance: She is well-developed. She is ill-appearing.  Cardiovascular:     Rate and Rhythm: Normal rate and regular rhythm.     Heart sounds: Normal heart sounds.  Pulmonary:     Effort: Pulmonary effort is normal.     Breath sounds: Normal breath sounds.  Skin:    General: Skin is dry.     Comments: See previous pictures under media tab. Purulent drainage present.   Neurological:     Mental Status: She is alert and oriented to person, place, and time.  Psychiatric:        Mood and Affect: Mood normal.     Lab Results Lab Results  Component Value Date   WBC 42.1 (H) 10/16/2021   HGB 8.2 (L) 10/16/2021   HCT 25.6 (L) 10/16/2021   MCV 82.8 10/16/2021   PLT 215 10/16/2021    Lab Results  Component Value Date   CREATININE  7.05 (H) 10/17/2021   BUN 135 (H) 10/17/2021   NA 125 (L) 10/17/2021   K 5.4 (H) 10/17/2021   CL 97 (L)  10/17/2021   CO2 18 (L) 10/17/2021    Lab Results  Component Value Date   ALT 25 10/17/2021   AST 63 (H) 10/17/2021   ALKPHOS 126 10/17/2021   BILITOT 1.0 10/17/2021     Microbiology: Recent Results (from the past 240 hour(s))  Culture, blood (Routine X 2) w Reflex to ID Panel     Status: None (Preliminary result)   Collection Time: 10/16/21  7:56 PM   Specimen: BLOOD  Result Value Ref Range Status   Specimen Description BLOOD RIGHT ANTECUBITAL  Final   Special Requests   Final    BOTTLES DRAWN AEROBIC AND ANAEROBIC Blood Culture adequate volume   Culture   Final    NO GROWTH < 12 HOURS Performed at [facility], [place] [address] [place], [place] [address]    Report Status PENDING  Incomplete  Resp Panel by RT-PCR (Flu A&B, Covid) Nasopharyngeal Swab     Status: None   Collection Time: 10/16/21  8:34 PM   Specimen: Nasopharyngeal Swab; Nasopharyngeal(NP) swabs in vial transport medium  Result Value Ref Range Status   SARS Coronavirus 2 by RT PCR NEGATIVE NEGATIVE Final    Comment: (NOTE) SARS-CoV-2 target nucleic acids are NOT DETECTED.  The SARS-CoV-2 RNA is generally detectable in upper respiratory specimens during the acute phase of infection. The lowest concentration of SARS-CoV-2 viral copies this assay can detect is 138 copies/mL. A negative result does not preclude SARS-Cov-2 infection and should not be used as the sole basis for treatment or other patient management decisions. A negative result may occur with  improper specimen collection/handling, submission of specimen other than nasopharyngeal swab, presence of viral mutation(s) within the areas targeted by this assay, and inadequate number of viral copies(<138 copies/mL). A negative result must be combined with clinical observations, patient history, and epidemiological information. The expected  result is Negative.  Fact Sheet for Patients:  EntrepreneurPulse.com.au  Fact Sheet for Healthcare Providers:  IncredibleEmployment.be  This test is no t yet approved or cleared by the [place] FDA and  has been authorized for detection and/or diagnosis of SARS-CoV-2 by FDA under an Emergency Use Authorization (EUA). This EUA will remain  in effect (meaning this test can be used) for the duration of the COVID-19 declaration under Section 564(b)(1) of the Act, 21 U.S.C.section 360bbb-3(b)(1), unless the authorization is terminated  or revoked sooner.       Influenza A by PCR NEGATIVE NEGATIVE Final   Influenza B by PCR NEGATIVE NEGATIVE Final    Comment: (NOTE) The Xpert Xpress SARS-CoV-2/FLU/RSV plus assay is intended as an aid in the diagnosis of influenza from Nasopharyngeal swab specimens and should not be used as a sole basis for treatment. Nasal washings and aspirates are unacceptable for Xpert Xpress SARS-CoV-2/FLU/RSV testing.  Fact Sheet for Patients: EntrepreneurPulse.com.au  Fact Sheet for Healthcare Providers: IncredibleEmployment.be  This test is not yet approved or cleared by the [place] FDA and has been authorized for detection and/or diagnosis of SARS-CoV-2 by FDA under an Emergency Use Authorization (EUA). This EUA will remain in effect (meaning this test can be used) for the duration of the COVID-19 declaration under Section 564(b)(1) of the Act, 21 U.S.C. section 360bbb-3(b)(1), unless the authorization is terminated or revoked.  Performed at [facility], [place] [address], [place],  [address]      Terri Piedra, [place] for Infectious Disease [facility]  10/17/2021  1:59 PM

## 2021-10-17 NOTE — ED Notes (Signed)
Pt eyes closed, NAD. A/ox4, has mumbling speech. Pt states she has been feeling weak x 15 days with n/v and wounds to feet which she states are old. Pt denies SOB,although RA [id number]%. -CP.

## 2021-10-17 NOTE — Consult Note (Signed)
[facility] Nurse Consult Note: Reason for Consult: WOC Nursing is simultaneously consulted with Orthopedics and Dr. Sharol Given has seen. He has seen this patient previously and recommended surgical intervention that she declines. He recommending transtibial amputation and at this time that surgery is planned for Wednesday in the event the patient can be stabilized. [place] Nursing will provided guidance for Nursing in the way of dry dressings (nonadherent) applied twice daily after cleansing. A sacral foam is to be placed prophylactically for PI prevention.  [place] nursing team will not follow, but will remain available to this patient, the nursing and medical teams.  Please re-consult if needed. Thanks, Maudie Flakes, MSN, RN, [place], Arther Abbott  Pager# [phone]

## 2021-10-17 NOTE — Consult Note (Signed)
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 neutrophil count is 40.8 and she has 0 lymphocytes.  Patient has an albumin of 1.9 BUN 141 creatinine 8.2.  Assessment: Assessment: Multi organ failure with abscess and osteomyelitis of the right foot and chronic osteomyelitis of the left foot.  Plan: Patient will need bilateral transtibial amputations.  If she can be  stabilized I can proceed with surgery on Wednesday.  Patient has multisystem life-threatening failure.  Thank you for the consult and the opportunity to see Ms. Billee Cashing, MD [facility] [phone] 7:33 AM

## 2021-10-17 NOTE — Progress Notes (Signed)
ANTICOAGULATION CONSULT NOTE - Initial Consult  Pharmacy Consult for Heparin Indication: Hx of DVT  Allergies  Allergen Reactions   Bee Pollen Anaphylaxis   Fish-Derived Products Hives, Shortness Of Breath, Swelling and Rash    Hives get in throat causing trouble breathing   Mushroom Extract Complex Anaphylaxis   Penicillins Anaphylaxis    **Tolerated cefepime March 2021 Did it involve swelling of the face/tongue/throat, SOB, or low BP? Yes Did it involve sudden or severe rash/hives, skin peeling, or any reaction on the inside of your mouth or nose? No Did you need to seek medical attention at a hospital or doctor's office? Yes When did it last happen?  A few months ago If all above answers are "NO", may proceed with cephalosporin use.   Rosemary Oil Anaphylaxis   Shellfish Allergy Hives, Shortness Of Breath, Swelling and Rash   Tomato Hives and Shortness Of Breath    Hives in throat causes her trouble breathing   Acetaminophen Other (See Comments)    GI upset   Aloe Vera Hives   Broccoli [Brassica Oleracea] Hives   Acyclovir And Related Other (See Comments)    Unknown reaction   Naproxen Other (See Comments)    Unknown reaction    Patient Measurements: Height: 5' 5"  (165.1 cm) Weight: 75.8 kg (167 lb) IBW/kg (Calculated) : 57 Heparin Dosing Weight: 72.6 kg  Vital Signs: BP: 116/62 (12/19 1200) Pulse Rate: 65 (12/19 1200)  Labs: Recent Labs    10/16/21 1956 10/17/21 0617  HGB 8.2*  --   HCT 25.6*  --   PLT 215  --   CREATININE 8.24* 7.05*  CKTOTAL  --  697*    Estimated Creatinine Clearance (by C-G formula based on SCr of 7.05 mg/dL (H)) Female: 8.1 mL/min (A) Female: 9.9 mL/min (A)   Medical History: Past Medical History:  Diagnosis Date   Acute MI ([place]) 2007   presented to ED & had cardiac cath- but found to have normal coronaries. Since that point in time her PCP cares f or cardiac needs. Dr. 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   Retinopathy 04/17/2019   Sleep apnea 11/16/2017   SOB (shortness of breath) 10/11/2020   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   Qualifier: Diagnosis of  By: Charlott Holler CMA, [place]     Type 2 diabetes mellitus with hyperglycemia, with long-term current use of insulin ([place]) 04/06/2017    Medications:  (Not in a hospital admission)  Scheduled:   atorvastatin  10 mg Oral Daily   cholecalciferol  400 Units Oral Daily   folic acid  2 mg Oral Daily   heparin  5,000 Units Subcutaneous Q8H   insulin aspart  0-6 Units Subcutaneous Q6H   insulin glargine-yfgn  20 Units Subcutaneous Q2200   pantoprazole  40 mg Oral Daily   sodium zirconium cyclosilicate  10 g Oral TID   Infusions:   ceFEPime (MAXIPIME) IV  Stopped (10/17/21 0030)   [START ON 10/18/2021] DAPTOmycin (CUBICIN)  IV     lactated ringers 50 mL/hr at 10/17/21 1962   sodium bicarbonate 150 mEq in D5W infusion 100 mL/hr at 10/17/21 1157   PRN: acetaminophen, HYDROmorphone (DILAUDID) injection, ondansetron **OR** ondansetron (ZOFRAN) IV, senna-docusate, traMADol  Assessment: 53 yof with a history of CKD 4, DMT2, CAD, HTN, fibromyalgia, neuropathy of legs, chronic ulcers of feet, history of DVTs, history of renal transplant, history of cirrhosis. Patient is presenting with AMS. Heparin per pharmacy consult placed for hx of DVT.  There is a question of whether or not patient is taking xarelto or not. Xarelto 2.5 mg tablets previously on medication list, but patient is not sure if she is taking or not. I also see 15 mg dosing in Care Everywhere but this seems to be from 2021?  Hgb 8.2 - at BL; plt 215  Goal of Therapy:  Heparin level 0.3-0.7 units/ml Monitor platelets by anticoagulation protocol: Yes   Plan:  Given unknown DOAC status will obtain baseline aPTT and heparin levels to help direct monitoring and will forego an initial heparin bolus Start heparin infusion at 1300 units/hr Check anti-Xa level in 8 hours and daily while on heparin Continue to monitor H&H and platelets  Lorelei Pont, PharmD, BCPS 10/17/2021 1:21 PM ED Clinical Pharmacist -  [phone]

## 2021-10-17 NOTE — Progress Notes (Signed)
PT Cancellation Note  Patient Details Name: Julann Mcgilvray MRN: [id number] DOB: Sep 20, 1956   Cancelled Treatment:    Reason Eval/Treat Not Completed: Other (comment) Per notes, pt awaiting bilateral BKA. Will sign off and hold on evaluation until after sx.   Lou Miner, DPT  Acute Rehabilitation Services  Pager: [phone] Office: [phone]    Rudean Hitt 10/17/2021, 1:55 PM

## 2021-10-17 NOTE — ED Notes (Signed)
Breakfast orders placed 

## 2021-10-17 NOTE — Progress Notes (Signed)
OT Cancellation Note  Patient Details Name: [name] [name] MRN: [id number] DOB: 07-21-1956   Cancelled Treatment:    Reason Eval/Treat Not Completed: Medical issues which prohibited therapy. Per notes, pt awaiting bilateral BKA. Will sign off and hold on evaluation until after sx.  Helane Gunther, OT/L  Acute [facility] [place] 10/17/2021, 2:15 PM

## 2021-10-17 NOTE — Progress Notes (Signed)
PHARMACY - PHYSICIAN COMMUNICATION CRITICAL VALUE ALERT - BLOOD CULTURE IDENTIFICATION (BCID)  [name] [name] is an 65 y.o. adult who presented to [facility] on 10/16/2021 with a chief complaint of AMS  Assessment:  osteomyelitis  Name of physician (or Provider) Contacted: [facility]  Current antibiotics: cefepime + daptomycin  Changes to prescribed antibiotics recommended:  Patient is on recommended antibiotics - No changes needed  Results for orders placed or performed during the hospital encounter of 10/16/21  Blood Culture ID Panel (Reflexed) (Collected: 10/16/2021  7:56 PM)  Result Value Ref Range   Enterococcus faecalis NOT DETECTED NOT DETECTED   Enterococcus Faecium NOT DETECTED NOT DETECTED   Listeria monocytogenes NOT DETECTED NOT DETECTED   Staphylococcus species NOT DETECTED NOT DETECTED   Staphylococcus aureus (BCID) NOT DETECTED NOT DETECTED   Staphylococcus epidermidis NOT DETECTED NOT DETECTED   Staphylococcus lugdunensis NOT DETECTED NOT DETECTED   Streptococcus species DETECTED (A) NOT DETECTED   Streptococcus agalactiae NOT DETECTED NOT DETECTED   Streptococcus pneumoniae NOT DETECTED NOT DETECTED   Streptococcus pyogenes NOT DETECTED NOT DETECTED   A.calcoaceticus-baumannii NOT DETECTED NOT DETECTED   Bacteroides fragilis NOT DETECTED NOT DETECTED   Enterobacterales NOT DETECTED NOT DETECTED   Enterobacter cloacae complex NOT DETECTED NOT DETECTED   Escherichia coli NOT DETECTED NOT DETECTED   Klebsiella aerogenes NOT DETECTED NOT DETECTED   Klebsiella oxytoca NOT DETECTED NOT DETECTED   Klebsiella pneumoniae NOT DETECTED NOT DETECTED   Proteus species NOT DETECTED NOT DETECTED   Salmonella species NOT DETECTED NOT DETECTED   Serratia marcescens NOT DETECTED NOT DETECTED   Haemophilus influenzae NOT DETECTED NOT DETECTED   Neisseria meningitidis NOT DETECTED NOT DETECTED   Pseudomonas aeruginosa NOT DETECTED NOT DETECTED   Stenotrophomonas maltophilia NOT  DETECTED NOT DETECTED   Candida albicans NOT DETECTED NOT DETECTED   Candida auris NOT DETECTED NOT DETECTED   Candida glabrata NOT DETECTED NOT DETECTED   Candida krusei NOT DETECTED NOT DETECTED   Candida parapsilosis NOT DETECTED NOT DETECTED   Candida tropicalis NOT DETECTED NOT DETECTED   Cryptococcus neoformans/gattii NOT DETECTED NOT DETECTED    Einar Grad 10/17/2021  9:09 PM

## 2021-10-17 NOTE — Progress Notes (Signed)
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  Chest diabetic foot ulcer right foot with changes consistent with possible osteomyelitis in the left heel.  Baseline creatinine 3.5 mg/dL.  Increased to 8 mg/dL.  She was also hyperkalemic with a potassium of 6.5 and hyponatremic with a sodium of 120.  She follows with Dr. Carolin Sicks at [facility].  Clinical evaluation in the emergency room was consistent with volume depletion.  She was started on IV fluids.  No urgent indication for renal replacement therapy.  Temporizing measures for her hyperkalemia and treatment of her hyponatremia with IV saline.  Blood pressure 123/60 pulse 67 temperature afebrile O2 sats 99% room air  IV fluids 2500 cc  Sodium 125 potassium 5.4 chloride 97 CO2 18 BUN 135 creatinine 7.05 glucose 153 phosphorus 8.3 albumin 1.6 calcium 7.  CPK 697.  Urinalysis greater than 50 RBCs per high-power field less than 5 WBCs per high-powered film negative for proteinuria  Medications: Lipitor 10 mg daily, insulin, Lokelma 10 g 3 times daily  IV Maxipime IV Cipro  IV clindamycin IV daptomycin  IV sodium bicarbonate at 100 cc an hour  Urethral catheter in place  Objective:  Vital signs in last 24 hours:  Temp:  [98.3 F (36.8 C)] 98.3 F (36.8 C) (12/18 1534) Pulse Rate:  [58-75] 63 (12/19 1000) Resp:  [7-20] 10 (12/19 1000) BP: (92-136)/(45-74) 110/74 (12/19 1000) SpO2:  [92 %-100 %] 100 % (12/19 1000) Weight:  [75.8 kg] 75.8 kg (12/19 1053)  Weight change:  Filed Weights   10/16/21 2223 10/17/21 1053  Weight: 75.8 kg 75.8 kg    Intake/Output: No intake/output data recorded.   Intake/Output this shift:  Total I/O In: 2500 [IV Piggyback:2500] Out: -   CVS- RRR no murmurs rubs gallops RS- CTA no wheezes or  rales ABD- BS present soft non-distended no ascites or masses EXT- no edema right foot with large ulcer   Basic Metabolic Panel: Recent Labs  Lab 10/16/21 1956 10/17/21 0617 10/17/21 0852  NA 120* 125*  --   K 6.6* 5.4*  --   CL 93* 97*  --   CO2 15* 18*  --   GLUCOSE 181* 153*  --   BUN 141* 135*  --   CREATININE 8.24* 7.05*  --   CALCIUM 7.8* 7.0*  --   MG  --   --  2.0  PHOS  --  8.3*  --     Liver Function Tests: Recent Labs  Lab 10/16/21 1956 10/17/21 0617  AST 65* 63*  ALT 28 25  ALKPHOS 144* 126  BILITOT 0.7 1.0  PROT 7.0 6.1*  ALBUMIN 1.9* 1.6*   Recent Labs  Lab 10/16/21 1956  LIPASE 42   No results for input(s): AMMONIA in the last 168 hours.  CBC: Recent Labs  Lab 10/16/21 1956  WBC 42.1*  NEUTROABS 40.8*  HGB 8.2*  HCT 25.6*  MCV 82.8  PLT 215    Cardiac Enzymes: Recent Labs  Lab 10/17/21 0617  CKTOTAL 697*    BNP: Invalid input(s): POCBNP  CBG: Recent Labs  Lab 10/17/21 0851  GLUCAP 147*    Microbiology: Results for orders placed or performed during the hospital encounter of 10/16/21  Culture, blood (Routine X 2) w Reflex to ID Panel     Status: None (Preliminary result)  Collection Time: 10/16/21  7:56 PM   Specimen: BLOOD  Result Value Ref Range Status   Specimen Description BLOOD RIGHT ANTECUBITAL  Final   Special Requests   Final    BOTTLES DRAWN AEROBIC AND ANAEROBIC Blood Culture adequate volume   Culture   Final    NO GROWTH < 12 HOURS Performed at [facility], [place] [address], [place], [place] [address]    Report Status PENDING  Incomplete  Resp Panel by RT-PCR (Flu A&B, Covid) Nasopharyngeal Swab     Status: None   Collection Time: 10/16/21  8:34 PM   Specimen: Nasopharyngeal Swab; Nasopharyngeal(NP) swabs in vial transport medium  Result Value Ref Range Status   SARS Coronavirus 2 by RT PCR NEGATIVE NEGATIVE Final    Comment: (NOTE) SARS-CoV-2 target nucleic acids are NOT DETECTED.  The  SARS-CoV-2 RNA is generally detectable in upper respiratory specimens during the acute phase of infection. The lowest concentration of SARS-CoV-2 viral copies this assay can detect is 138 copies/mL. A negative result does not preclude SARS-Cov-2 infection and should not be used as the sole basis for treatment or other patient management decisions. A negative result may occur with  improper specimen collection/handling, submission of specimen other than nasopharyngeal swab, presence of viral mutation(s) within the areas targeted by this assay, and inadequate number of viral copies(<138 copies/mL). A negative result must be combined with clinical observations, patient history, and epidemiological information. The expected result is Negative.  Fact Sheet for Patients:  EntrepreneurPulse.com.au  Fact Sheet for Healthcare Providers:  IncredibleEmployment.be  This test is no t yet approved or cleared by the [place] FDA and  has been authorized for detection and/or diagnosis of SARS-CoV-2 by FDA under an Emergency Use Authorization (EUA). This EUA will remain  in effect (meaning this test can be used) for the duration of the COVID-19 declaration under Section 564(b)(1) of the Act, 21 U.S.C.section 360bbb-3(b)(1), unless the authorization is terminated  or revoked sooner.       Influenza A by PCR NEGATIVE NEGATIVE Final   Influenza B by PCR NEGATIVE NEGATIVE Final    Comment: (NOTE) The Xpert Xpress SARS-CoV-2/FLU/RSV plus assay is intended as an aid in the diagnosis of influenza from Nasopharyngeal swab specimens and should not be used as a sole basis for treatment. Nasal washings and aspirates are unacceptable for Xpert Xpress SARS-CoV-2/FLU/RSV testing.  Fact Sheet for Patients: EntrepreneurPulse.com.au  Fact Sheet for Healthcare Providers: IncredibleEmployment.be  This test is not yet approved or  cleared by the [place] FDA and has been authorized for detection and/or diagnosis of SARS-CoV-2 by FDA under an Emergency Use Authorization (EUA). This EUA will remain in effect (meaning this test can be used) for the duration of the COVID-19 declaration under Section 564(b)(1) of the Act, 21 U.S.C. section 360bbb-3(b)(1), unless the authorization is terminated or revoked.  Performed at [facility], [place] [address], [place], [place] [address]    *Note: Due to a large number of results and/or encounters for the requested time period, some results have not been displayed. A complete set of results can be found in Results Review.    Coagulation Studies: No results for input(s): LABPROT, INR in the last 72 hours.  Urinalysis: Recent Labs    10/17/21 0623  COLORURINE YELLOW  LABSPEC 1.020  PHURINE 5.0  GLUCOSEU NEGATIVE  HGBUR LARGE*  BILIRUBINUR NEGATIVE  KETONESUR NEGATIVE  PROTEINUR NEGATIVE  NITRITE NEGATIVE  LEUKOCYTESUR NEGATIVE  Imaging: DG Chest 1 View  Result Date: 10/16/2021 CLINICAL DATA:  Weakness, known left rib fractures EXAM: CHEST  1 VIEW COMPARISON:  None. FINDINGS: Lungs are well expanded, symmetric, and clear. No pneumothorax or pleural effusion. Cardiac size within normal limits. Pulmonary vascularity is normal. Osseous structures are age-appropriate. Known left ninth and tenth rib fractures is not well visualized on this examination. Healed left clavicular mid-diaphyseal fracture noted. IMPRESSION: No active disease. Known left ninth and tenth rib fractures are not well visualized on this exam. Electronically Signed   By: Fidela Salisbury M.D.   On: 10/16/2021 19:52   CT Head Wo Contrast  Result Date: 10/16/2021 CLINICAL DATA:  Increasing weakness over the past week with subsequent fall, initial encounter EXAM: CT HEAD WITHOUT CONTRAST CT CERVICAL SPINE WITHOUT CONTRAST TECHNIQUE: Multidetector CT imaging of the head and cervical spine was  performed following the standard protocol without intravenous contrast. Multiplanar CT image reconstructions of the cervical spine were also generated. COMPARISON:  02/13/2020 FINDINGS: CT HEAD FINDINGS Brain: No evidence of acute infarction, hemorrhage, hydrocephalus, extra-axial collection or mass lesion/mass effect. Vascular: No hyperdense vessel or unexpected calcification. Skull: Normal. Negative for fracture or focal lesion. Sinuses/Orbits: No acute finding. Other: None. CT CERVICAL SPINE FINDINGS Alignment: Within normal limits. Skull base and vertebrae: 7 cervical segments are well visualized. Vertebral body height is well maintained. Disc space narrowing with osteophytic changes are noted at C5-6. Mild facet hypertrophic changes are noted. No acute fracture or acute facet abnormality is noted. Soft tissues and spinal canal: No prevertebral fluid or swelling. No visible canal hematoma. Upper chest: Visualized lung apices are within normal limits Other: None IMPRESSION: CT of the head: No acute intracranial abnormality noted. CT of the cervical spine: Mild degenerative changes without acute abnormality. Electronically Signed   By: Inez Catalina M.D.   On: 10/16/2021 19:58   CT Cervical Spine Wo Contrast  Result Date: 10/16/2021 CLINICAL DATA:  Increasing weakness over the past week with subsequent fall, initial encounter EXAM: CT HEAD WITHOUT CONTRAST CT CERVICAL SPINE WITHOUT CONTRAST TECHNIQUE: Multidetector CT imaging of the head and cervical spine was performed following the standard protocol without intravenous contrast. Multiplanar CT image reconstructions of the cervical spine were also generated. COMPARISON:  02/13/2020 FINDINGS: CT HEAD FINDINGS Brain: No evidence of acute infarction, hemorrhage, hydrocephalus, extra-axial collection or mass lesion/mass effect. Vascular: No hyperdense vessel or unexpected calcification. Skull: Normal. Negative for fracture or focal lesion. Sinuses/Orbits: No  acute finding. Other: None. CT CERVICAL SPINE FINDINGS Alignment: Within normal limits. Skull base and vertebrae: 7 cervical segments are well visualized. Vertebral body height is well maintained. Disc space narrowing with osteophytic changes are noted at C5-6. Mild facet hypertrophic changes are noted. No acute fracture or acute facet abnormality is noted. Soft tissues and spinal canal: No prevertebral fluid or swelling. No visible canal hematoma. Upper chest: Visualized lung apices are within normal limits Other: None IMPRESSION: CT of the head: No acute intracranial abnormality noted. CT of the cervical spine: Mild degenerative changes without acute abnormality. Electronically Signed   By: Inez Catalina M.D.   On: 10/16/2021 19:58   MR FOOT RIGHT WO CONTRAST  Result Date: 10/17/2021 CLINICAL DATA:  Diabetic foot ulcer EXAM: MRI OF THE RIGHT FOREFOOT WITHOUT CONTRAST TECHNIQUE: Multiplanar, multisequence MR imaging of the right forefoot was performed. No intravenous contrast was administered. COMPARISON:  X-ray 10/16/2021 FINDINGS: Bones/Joint/Cartilage Postsurgical changes of prior fifth ray resection. Extensive bone marrow edema with intermediate-low T1 signal changes at  the fourth metatarsal base and proximal diaphysis as well as throughout the adjacent cuboid, compatible with acute osteomyelitis. Fluid within the fourth TMT joint space suggests septic arthritis. Patchy marrow edema throughout the remaining bones of the midfoot including the cuneiform bones and lateral portion of the navicular bone. Similar patchy marrow edema involves the bases of the first, second, and third metatarsals. There are areas of intermediate T1 signal intensity within these bones which favor a component of early acute osteomyelitis. No acute fracture or dislocation. Ligaments Lisfranc ligament is poorly defined. Collateral ligaments of the MTP joints are intact. Muscles and Tendons Marked edematous changes of the visualized  foot musculature likely reflecting a combination of myositis and denervation. No focal tenosynovial fluid collection. Soft tissues Soft tissue ulceration at the dorso lateral aspect of the forefoot overlying the level of the fourth metatarsal diaphysis. Additional deep soft tissue ulceration along the plantar aspect of the forefoot underlying the level of the fourth metatarsal base with tract extending to the underlying bone. Two small fluid collections within the soft tissues near the level of the fourth TMT joint measuring up to 1.3 cm. Diffuse circumferential soft tissue edema. Numerous foci of susceptibility within the soft tissues in the region of the midfoot compatible with subcutaneous emphysema seen radiographically. IMPRESSION: 1. Acute osteomyelitis of the fourth metatarsal base and proximal diaphysis as well as throughout the adjacent cuboid. Fluid within the fourth TMT joint suggests septic arthritis. 2. Patchy marrow edema throughout the remaining bones of the midfoot as well as the metatarsal bases. Findings favor a component of early acute osteomyelitis. 3. Soft tissue ulcerations at the dorsolateral aspect of the forefoot overlying the level of the fourth metatarsal diaphysis. Additional deep soft tissue ulceration along the plantar aspect of the forefoot underlying the level of the fourth metatarsal base with tract extending to the underlying bone. Two small abscesses within the soft tissues near the level of the fourth TMT joint measuring up to 1.3 cm. 4. Numerous foci of susceptibility within the soft tissues in the region of the midfoot compatible with subcutaneous emphysema seen radiographically. 5. Marked edematous changes of the visualized foot musculature likely reflecting a combination of myositis and denervation. Electronically Signed   By: Davina Poke D.O.   On: 10/17/2021 08:20   DG Foot Complete Left  Result Date: 10/16/2021 CLINICAL DATA:  Bilateral foot wound EXAM: LEFT FOOT  - COMPLETE 3+ VIEW COMPARISON:  None. FINDINGS: Posttraumatic or postsurgical change involving the left calcaneus is again noted with truncation of the posterior calcaneus. No acute fracture or dislocation. Large soft tissue ulcer is seen subjacent to the calcaneus, increased in size since prior examination. There is extensive soft tissue swelling of the left hindfoot. There has developed subtle periosteal reaction and sclerosis involving the inferior aspect of the calcaneus which may reflect changes of a chronic osteomyelitis in this location. IMPRESSION: Enlarging, large soft tissue ulcer involving the plantar aspect of the left hindfoot with subjacent sclerosis and subtle periosteal reaction involving the calcaneus suspicious for changes of chronic osteomyelitis in this location. Extensive surrounding soft tissue swelling. Electronically Signed   By: Fidela Salisbury M.D.   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 There has developed a large soft tissue ulceration involving the lateral, plantar aspect of the right midfoot subjacent to the a  cuboid. No associated osseous erosion to suggest osteomyelitis. There is extensive soft tissue swelling of the right mid and forefoot. Subcutaneous edema noted within the right ankle. There are multiple foci of gas noted within the subcutaneous soft tissues of the right forefoot dorsally and midfoot medially, possibly related to changes of direct trauma or aggressive infection. No acute fracture or dislocation. Moderate plantar and superior calcaneal spurs are noted. IMPRESSION: Soft tissue ulcer involving the lateral, plantar right midfoot. Extensive soft tissue swelling of the right foot and ankle. Subcutaneous gas within the right forefoot and midfoot remote from the ulcer may reflect changes of  direct trauma or aggressive infection. Electronically Signed   By: Fidela Salisbury M.D.   On: 10/16/2021 19:55   CT CHEST ABDOMEN PELVIS WO CONTRAST  Result Date: 10/16/2021 CLINICAL DATA:  Altered mental status, fall, found down, abdominal distension. Abdominal pain, acute, nonlocalized Abdominal trauma, blunt. EXAM: CT CHEST, ABDOMEN AND PELVIS WITHOUT CONTRAST TECHNIQUE: Multidetector CT imaging of the chest, abdomen and pelvis was performed following the standard protocol without IV contrast. COMPARISON:  08/23/2021 FINDINGS: CT CHEST FINDINGS Cardiovascular: Extensive multi-vessel coronary artery calcification. Global cardiac size within normal limits. No pericardial effusion. Central pulmonary arteries are of normal caliber. Mild atherosclerotic calcification within the thoracic aorta. No aortic aneurysm. Mediastinum/Nodes: No enlarged mediastinal, hilar, or axillary lymph nodes. Thyroid gland, trachea, and esophagus demonstrate no significant findings. Lungs/Pleura: Mild bibasilar atelectasis. Minimal left basilar pleural thickening superficial to acute left posterior rib fractures. The lungs are otherwise clear. No pneumothorax or pleural effusion. Central airways are widely patent. Musculoskeletal: There are acute mildly displaced fractures of the left ninth and tenth ribs posteriorly. CT ABDOMEN PELVIS FINDINGS Hepatobiliary: The liver contour is nodular in keeping with changes of cirrhosis. 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 No definite solid intrahepatic mass identified. No intrahepatic biliary ductal dilation. There is mild extrahepatic biliary ductal dilation, progressive since prior examination, with the extrahepatic bile duct measuring up to 10 mm in diameter. Pancreas: Unremarkable Spleen: The spleen is mildly enlarged measuring 14.1 cm in greatest dimension, stable since prior examination. No intrasplenic masses  are seen. Adrenals/Urinary Tract: Adrenal glands are unremarkable. Kidneys are normal, without renal calculi, focal lesion, or hydronephrosis. Bladder is unremarkable. Stomach/Bowel: Stomach is within normal limits. Appendix appears normal. No evidence of bowel wall thickening, distention, or inflammatory changes. Vascular/Lymphatic: Aortic atherosclerosis. No enlarged abdominal or pelvic lymph nodes. Reproductive: Status post hysterectomy. No adnexal masses. Other: Tiny fat containing umbilical hernia.  No ascites. Musculoskeletal: L1 corpectomy and T11-L3 posterior lumbar fusion with instrumentation has been performed. No definite bridging callus or incorporation of interbody bone graft is identified along the fused segment. Additionally, there is periprosthetic lucency involving the pedicle screws of T11, L2, and L3 as well as fracture of the left pedicle screw of T11 and migration of the proximal aspects of the T11 pedicle screws into the T10-11 disc space. Together, the findings suggest motion along the fused segment. No acute fracture identified. IMPRESSION: Acute mildly displaced fractures of the left ninth and tenth ribs posteriorly. No pneumothorax. Extensive multi-vessel coronary artery calcification. Cirrhosis. 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 L1 corpectomy and T11-L3 posterior lumbar fusion procedure with findings suggesting motion along the fused segment as noted above. This may be better assessed with dedicated CT imaging, if indicated. Aortic Atherosclerosis (ICD10-I70.0). Electronically Signed   By: Fidela Salisbury M.D.   On: 10/16/2021 19:50  Medications:    ceFEPime (MAXIPIME) IV Stopped (10/17/21 0030)   [START ON 10/18/2021] DAPTOmycin (CUBICIN)  IV     lactated ringers 50 mL/hr at 10/17/21 0622   sodium bicarbonate 150 mEq in D5W infusion 100 mL/hr at 10/17/21 0018    atorvastatin  10 mg Oral Daily   heparin  5,000 Units Subcutaneous Q8H    insulin aspart  0-6 Units Subcutaneous TID WC & HS   insulin glargine-yfgn  20 Units Subcutaneous Q2200   sodium zirconium cyclosilicate  10 g Oral TID   acetaminophen, HYDROmorphone (DILAUDID) injection, ondansetron **OR** ondansetron (ZOFRAN) IV, senna-docusate, traMADol  Assessment/ Plan:  Acute on chronic kidney injury.  Baseline creatinine about 3.5.  Creatinine seems to be slowly improving with IV hydration.  She also has a Foley catheter placed to monitor I's and O's.  It appears unclear whether she had some bladder outlet obstruction although no hydronephrosis was seen on CT scan.  We will continue to avoid nephrotoxins no ACE inhibitor's no ARB's no nonsteroidal anti-inflammatory drugs avoid IV contrast.  Renally adjust medications follow renal panel on daily basis monitor I's and O's ANEMIA-does not appear to be an issue at this point.  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 Hyperkalemia improved with Lokelma Hyponatremia improved with isotonic solution Metabolic acidosis improved with sodium bicarbonate Diabetic foot ulcers followed by primary service IV antibiotics per primary service    LOS: [place] @TODAY @10 :58 AM

## 2021-10-17 NOTE — Progress Notes (Signed)
PROGRESS NOTE                                                                                                                                                                                                             Patient Demographics:    [name] [name], is a 65 y.o. adult, DOB - 06/25/1956, GBE:[id number]  Outpatient Primary MD for the patient is Carollee Herter, Alferd Apa, DO    LOS - 1  Admit date - 10/16/2021    Chief Complaint  Patient presents with   Altered Mental Status       Brief Narrative (HPI from H&P)    [name] [name] is a 65 y.o. adult with medical history significant for CKD 4, DMT2, CAD, HTN, fibromyalgia, neuropathy of legs, chronic ulcers of feet, history of DVTs, history of renal transplant, history of cirrhosis who presents for generalized weakness that is progressively worsened gradually over the last 2 weeks prior to hospitalization in the ER she was diagnosed with severe sepsis due to bilateral foot cellulitis, AKI on CKD 4, severe dehydration with hyponatremia.  She was admitted for further care   Subjective:    [name] [name] today has, No headache, No chest pain, No abdominal pain - No Nausea, No new weakness tingling or numbness, no shortness of breath.   Assessment  & Plan :     Sepsis due to bilateral foot cellulitis with right foot abscess and draining ulcer, left foot heel ulcer. - her symptoms have been ongoing for the last 10 to 14 days prior to hospitalization, she appears extremely sick, being hydrated with IV fluids on empiric IV antibiotics.  ID and Ortho are seeing.  She most likely will end up losing both her feet to amputation.  Continue supportive care and monitor closely follow cultures.  2.  AKI on CKD/renal transplant with severe dehydration and hyponatremia.  Hydrate, Foley, renal on board.  Defer management of this issue to nephrology.  3.  Dyslipidemia.  Replaced on  statin.  4.  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  For now heparin drip.  5.  DM type II.  On Lantus and sliding scale will monitor and adjust   Lab Results  Component Value Date   HGBA1C 7.1 (H) 10/17/2021   CBG (last 3)  Recent Labs    10/17/21 [place]  147*         Condition - Extremely Guarded  Family Communication  : Husband Marguerite Olea [phone] called 10/17/2021    Code Status :  Full  Consults  : Orthopedics, nephrology, ID  PUD Prophylaxis : PPI   Procedures  :            Disposition Plan  :    Status is: Inpatient  Remains inpatient appropriate because: Sepsis  DVT Prophylaxis  :    heparin injection 5,000 Units Start: 10/17/21 0600    Lab Results  Component Value Date   PLT 215 10/16/2021    Diet :  Diet Order             Diet heart healthy/carb modified Room service appropriate? Yes; Fluid consistency: Thin  Diet effective now                    Inpatient Medications  Scheduled Meds:  atorvastatin  10 mg Oral Daily   heparin  5,000 Units Subcutaneous Q8H   insulin aspart  0-6 Units Subcutaneous TID WC & HS   insulin glargine-yfgn  20 Units Subcutaneous Q2200   sodium zirconium cyclosilicate  10 g Oral TID   Continuous Infusions:  ceFEPime (MAXIPIME) IV Stopped (10/17/21 0030)   [START ON 10/18/2021] DAPTOmycin (CUBICIN)  IV     lactated ringers 50 mL/hr at 10/17/21 0622   sodium bicarbonate 150 mEq in D5W infusion 100 mL/hr at 10/17/21 1157   PRN Meds:.acetaminophen, HYDROmorphone (DILAUDID) injection, ondansetron **OR** ondansetron (ZOFRAN) IV, senna-docusate, traMADol  Antibiotics  :    Anti-infectives (From admission, onward)    Start     Dose/Rate Route Frequency Ordered Stop   10/18/21 2000  DAPTOmycin (CUBICIN) 600 mg in sodium chloride 0.9 % IVPB        8 mg/kg  75.8 kg 124 mL/hr over 30 Minutes Intravenous Every 48 hours 10/16/21 2245     10/16/21 2330  DAPTOmycin (CUBICIN) 600 mg in sodium chloride 0.9 % IVPB         8 mg/kg  75.8 kg 124 mL/hr over 30 Minutes Intravenous  Once 10/16/21 2239 10/17/21 0358   10/16/21 2300  ceFEPIme (MAXIPIME) 1 g in sodium chloride 0.9 % 100 mL IVPB        1 g 200 mL/hr over 30 Minutes Intravenous Every 24 hours 10/16/21 2239     10/16/21 2045  vancomycin (VANCOREADY) IVPB 1500 mg/300 mL  Status:  Discontinued        1,500 mg 150 mL/hr over 120 Minutes Intravenous  Once 10/16/21 2006 10/16/21 2218   10/16/21 2015  ciprofloxacin (CIPRO) IVPB 400 mg        400 mg 200 mL/hr over 60 Minutes Intravenous  Once 10/16/21 2006 10/16/21 2223   10/16/21 2015  clindamycin (CLEOCIN) IVPB 600 mg        600 mg 100 mL/hr over 30 Minutes Intravenous  Once 10/16/21 2006 10/16/21 2116        Time Spent in minutes  30   Lala Lund M.D on 10/17/2021 at 12:10 PM  To page go to [email or web address]   [facility] -  Office  [phone]  See all Orders from today for further details    Objective:   Vitals:   10/17/21 0945 10/17/21 1000 10/17/21 1053 10/17/21 1200  BP:  110/74  116/62  Pulse: 62 63  65  Resp: (!) 9 10  13   Temp:      TempSrc:  SpO2: 99% 100%  98%  Weight:   75.8 kg   Height:   5' 5"  (1.651 m)     Wt Readings from Last 3 Encounters:  10/17/21 75.8 kg  09/21/21 74.4 kg  07/29/21 73.5 kg     Intake/Output Summary (Last 24 hours) at 10/17/2021 1210 Last data filed at 10/17/2021 1155 Gross per 24 hour  Intake 3650 ml  Output --  Net 3650 ml     Physical Exam  Awake but somnolent, No new F.N deficits, dry oral mucosa, Foley catheter in place [place].AT,PERRAL Supple Neck, No JVD,   Symmetrical Chest wall movement, Good air movement bilaterally, CTAB RRR,No Gallops,Rubs or new Murmurs,  +ve B.Sounds, Abd Soft, No tenderness,   No Cyanosis, Clubbing or edema   Foot pictures from 10/17/2021 placed after consent       RN pressure injury documentation: Pressure Injury 04/16/19 Toe (Comment  which one) Left Deep Tissue Injury -  Purple or maroon localized area of discolored intact skin or blood-filled blister due to damage of underlying soft tissue from pressure and/or shear. maroon coloring to tip of to (Active)  04/16/19 1500  Location: Toe (Comment  which one) (L great toe)  Location Orientation: Left  Staging: Deep Tissue Injury - Purple or maroon localized area of discolored intact skin or blood-filled blister due to damage of underlying soft tissue from pressure and/or shear.  Wound Description (Comments): maroon coloring to tip of toe  Present on Admission: Yes     Pressure Injury 04/16/19 Toe (Comment  which one) Anterior;Left Stage II -  Partial thickness loss of dermis presenting as a shallow open ulcer with a red, pink wound bed without slough. appears as opened blister - rubbed from cast (Active)  04/16/19 1500  Location: Toe (Comment  which one) (5th toe - pinky)  Location Orientation: Anterior;Left  Staging: Stage II -  Partial thickness loss of dermis presenting as a shallow open ulcer with a red, pink wound bed without slough.  Wound Description (Comments): appears as opened blister - rubbed from cast  Present on Admission: Yes     Data Review:    CBC Recent Labs  Lab 10/16/21 1956  WBC 42.1*  HGB 8.2*  HCT 25.6*  PLT 215  MCV 82.8  MCH 26.5  MCHC 32.0  RDW 18.6*  LYMPHSABS 0.0*  MONOABS 1.3*  EOSABS 0.0  BASOSABS 0.0    Electrolytes Recent Labs  Lab 10/16/21 1956 10/17/21 0617 10/17/21 0852  NA 120* 125*  --   K 6.6* 5.4*  --   CL 93* 97*  --   CO2 15* 18*  --   GLUCOSE 181* 153*  --   BUN 141* 135*  --   CREATININE 8.24* 7.05*  --   CALCIUM 7.8* 7.0*  --   AST 65* 63*  --   ALT 28 25  --   ALKPHOS 144* 126  --   BILITOT 0.7 1.0  --   ALBUMIN 1.9* 1.6*  --   MG  --   --  2.0  LATICACIDVEN 1.1  --   --   HGBA1C  --  7.1*  --     ------------------------------------------------------------------------------------------------------------------ No results for  input(s): CHOL, HDL, LDLCALC, TRIG, CHOLHDL, LDLDIRECT in the last 72 hours.  Lab Results  Component Value Date   HGBA1C 7.1 (H) 10/17/2021    No results for input(s): TSH, T4TOTAL, T3FREE, THYROIDAB in the last 72 hours.  Invalid input(s): FREET3 ------------------------------------------------------------------------------------------------------------------ ID Labs  Recent Labs  Lab 10/16/21 1956 10/17/21 0617  WBC 42.1*  --   PLT 215  --   LATICACIDVEN 1.1  --   CREATININE 8.24* 7.05*   Cardiac Enzymes No results for input(s): CKMB, TROPONINI, MYOGLOBIN in the last 168 hours.  Invalid input(s): CK       Micro Results Recent Results (from the past 240 hour(s))  Culture, blood (Routine X 2) w Reflex to ID Panel     Status: None (Preliminary result)   Collection Time: 10/16/21  7:56 PM   Specimen: BLOOD  Result Value Ref Range Status   Specimen Description BLOOD RIGHT ANTECUBITAL  Final   Special Requests   Final    BOTTLES DRAWN AEROBIC AND ANAEROBIC Blood Culture adequate volume   Culture   Final    NO GROWTH < 12 HOURS Performed at [facility], [address] [place], [place] [address]    Report Status PENDING  Incomplete  Resp Panel by RT-PCR (Flu A&B, Covid) Nasopharyngeal Swab     Status: None   Collection Time: 10/16/21  8:34 PM   Specimen: Nasopharyngeal Swab; Nasopharyngeal(NP) swabs in vial transport medium  Result Value Ref Range Status   SARS Coronavirus 2 by RT PCR NEGATIVE NEGATIVE Final    Comment: (NOTE) SARS-CoV-2 target nucleic acids are NOT DETECTED.  The SARS-CoV-2 RNA is generally detectable in upper respiratory specimens during the acute phase of infection. The lowest concentration of SARS-CoV-2 viral copies this assay can detect is 138 copies/mL. A negative result does not preclude SARS-Cov-2 infection and should not be used as the sole basis for treatment or other patient management decisions. A negative result may occur with   improper specimen collection/handling, submission of specimen other than nasopharyngeal swab, presence of viral mutation(s) within the areas targeted by this assay, and inadequate number of viral copies(<138 copies/mL). A negative result must be combined with clinical observations, patient history, and epidemiological information. The expected result is Negative.  Fact Sheet for Patients:  EntrepreneurPulse.com.au  Fact Sheet for Healthcare Providers:  IncredibleEmployment.be  This test is no t yet approved or cleared by the [place] FDA and  has been authorized for detection and/or diagnosis of SARS-CoV-2 by FDA under an Emergency Use Authorization (EUA). This EUA will remain  in effect (meaning this test can be used) for the duration of the COVID-19 declaration under Section 564(b)(1) of the Act, 21 U.S.C.section 360bbb-3(b)(1), unless the authorization is terminated  or revoked sooner.       Influenza A by PCR NEGATIVE NEGATIVE Final   Influenza B by PCR NEGATIVE NEGATIVE Final    Comment: (NOTE) The Xpert Xpress SARS-CoV-2/FLU/RSV plus assay is intended as an aid in the diagnosis of influenza from Nasopharyngeal swab specimens and should not be used as a sole basis for treatment. Nasal washings and aspirates are unacceptable for Xpert Xpress SARS-CoV-2/FLU/RSV testing.  Fact Sheet for Patients: EntrepreneurPulse.com.au  Fact Sheet for Healthcare Providers: IncredibleEmployment.be  This test is not yet approved or cleared by the [place] FDA and has been authorized for detection and/or diagnosis of SARS-CoV-2 by FDA under an Emergency Use Authorization (EUA). This EUA will remain in effect (meaning this test can be used) for the duration of the COVID-19 declaration under Section 564(b)(1) of the Act, 21 U.S.C. section 360bbb-3(b)(1), unless the authorization is terminated  or revoked.  Performed at [facility], [place] [address] [place], [place] [address]     Radiology Reports DG Chest 1  View  Result Date: 10/16/2021 CLINICAL DATA:  Weakness, known left rib fractures EXAM: CHEST  1 VIEW COMPARISON:  None. FINDINGS: Lungs are well expanded, symmetric, and clear. No pneumothorax or pleural effusion. Cardiac size within normal limits. Pulmonary vascularity is normal. Osseous structures are age-appropriate. Known left ninth and tenth rib fractures is not well visualized on this examination. Healed left clavicular mid-diaphyseal fracture noted. IMPRESSION: No active disease. Known left ninth and tenth rib fractures are not well visualized on this exam. Electronically Signed   By: Fidela Salisbury M.D.   On: 10/16/2021 19:52   CT Head Wo Contrast  Result Date: 10/16/2021 CLINICAL DATA:  Increasing weakness over the past week with subsequent fall, initial encounter EXAM: CT HEAD WITHOUT CONTRAST CT CERVICAL SPINE WITHOUT CONTRAST TECHNIQUE: Multidetector CT imaging of the head and cervical spine was performed following the standard protocol without intravenous contrast. Multiplanar CT image reconstructions of the cervical spine were also generated. COMPARISON:  02/13/2020 FINDINGS: CT HEAD FINDINGS Brain: No evidence of acute infarction, hemorrhage, hydrocephalus, extra-axial collection or mass lesion/mass effect. Vascular: No hyperdense vessel or unexpected calcification. Skull: Normal. Negative for fracture or focal lesion. Sinuses/Orbits: No acute finding. Other: None. CT CERVICAL SPINE FINDINGS Alignment: Within normal limits. Skull base and vertebrae: 7 cervical segments are well visualized. Vertebral body height is well maintained. Disc space narrowing with osteophytic changes are noted at C5-6. Mild facet hypertrophic changes are noted. No acute fracture or acute facet abnormality is noted. Soft tissues and spinal canal: No prevertebral fluid or swelling. No  visible canal hematoma. Upper chest: Visualized lung apices are within normal limits Other: None IMPRESSION: CT of the head: No acute intracranial abnormality noted. CT of the cervical spine: Mild degenerative changes without acute abnormality. Electronically Signed   By: Inez Catalina M.D.   On: 10/16/2021 19:58   CT Cervical Spine Wo Contrast  Result Date: 10/16/2021 CLINICAL DATA:  Increasing weakness over the past week with subsequent fall, initial encounter EXAM: CT HEAD WITHOUT CONTRAST CT CERVICAL SPINE WITHOUT CONTRAST TECHNIQUE: Multidetector CT imaging of the head and cervical spine was performed following the standard protocol without intravenous contrast. Multiplanar CT image reconstructions of the cervical spine were also generated. COMPARISON:  02/13/2020 FINDINGS: CT HEAD FINDINGS Brain: No evidence of acute infarction, hemorrhage, hydrocephalus, extra-axial collection or mass lesion/mass effect. Vascular: No hyperdense vessel or unexpected calcification. Skull: Normal. Negative for fracture or focal lesion. Sinuses/Orbits: No acute finding. Other: None. CT CERVICAL SPINE FINDINGS Alignment: Within normal limits. Skull base and vertebrae: 7 cervical segments are well visualized. Vertebral body height is well maintained. Disc space narrowing with osteophytic changes are noted at C5-6. Mild facet hypertrophic changes are noted. No acute fracture or acute facet abnormality is noted. Soft tissues and spinal canal: No prevertebral fluid or swelling. No visible canal hematoma. Upper chest: Visualized lung apices are within normal limits Other: None IMPRESSION: CT of the head: No acute intracranial abnormality noted. CT of the cervical spine: Mild degenerative changes without acute abnormality. Electronically Signed   By: Inez Catalina M.D.   On: 10/16/2021 19:58   MR FOOT RIGHT WO CONTRAST  Result Date: 10/17/2021 CLINICAL DATA:  Diabetic foot ulcer EXAM: MRI OF THE RIGHT FOREFOOT WITHOUT CONTRAST  TECHNIQUE: Multiplanar, multisequence MR imaging of the right forefoot was performed. No intravenous contrast was administered. COMPARISON:  X-ray 10/16/2021 FINDINGS: Bones/Joint/Cartilage Postsurgical changes of prior fifth ray resection. Extensive bone marrow edema with intermediate-low T1 signal changes at the fourth metatarsal base  and proximal diaphysis as well as throughout the adjacent cuboid, compatible with acute osteomyelitis. Fluid within the fourth TMT joint space suggests septic arthritis. Patchy marrow edema throughout the remaining bones of the midfoot including the cuneiform bones and lateral portion of the navicular bone. Similar patchy marrow edema involves the bases of the first, second, and third metatarsals. There are areas of intermediate T1 signal intensity within these bones which favor a component of early acute osteomyelitis. No acute fracture or dislocation. Ligaments Lisfranc ligament is poorly defined. Collateral ligaments of the MTP joints are intact. Muscles and Tendons Marked edematous changes of the visualized foot musculature likely reflecting a combination of myositis and denervation. No focal tenosynovial fluid collection. Soft tissues Soft tissue ulceration at the dorso lateral aspect of the forefoot overlying the level of the fourth metatarsal diaphysis. Additional deep soft tissue ulceration along the plantar aspect of the forefoot underlying the level of the fourth metatarsal base with tract extending to the underlying bone. Two small fluid collections within the soft tissues near the level of the fourth TMT joint measuring up to 1.3 cm. Diffuse circumferential soft tissue edema. Numerous foci of susceptibility within the soft tissues in the region of the midfoot compatible with subcutaneous emphysema seen radiographically. IMPRESSION: 1. Acute osteomyelitis of the fourth metatarsal base and proximal diaphysis as well as throughout the adjacent cuboid. Fluid within the fourth  TMT joint suggests septic arthritis. 2. Patchy marrow edema throughout the remaining bones of the midfoot as well as the metatarsal bases. Findings favor a component of early acute osteomyelitis. 3. Soft tissue ulcerations at the dorsolateral aspect of the forefoot overlying the level of the fourth metatarsal diaphysis. Additional deep soft tissue ulceration along the plantar aspect of the forefoot underlying the level of the fourth metatarsal base with tract extending to the underlying bone. Two small abscesses within the soft tissues near the level of the fourth TMT joint measuring up to 1.3 cm. 4. Numerous foci of susceptibility within the soft tissues in the region of the midfoot compatible with subcutaneous emphysema seen radiographically. 5. Marked edematous changes of the visualized foot musculature likely reflecting a combination of myositis and denervation. Electronically Signed   By: Davina Poke D.O.   On: 10/17/2021 08:20   DG Foot Complete Left  Result Date: 10/16/2021 CLINICAL DATA:  Bilateral foot wound EXAM: LEFT FOOT - COMPLETE 3+ VIEW COMPARISON:  None. FINDINGS: Posttraumatic or postsurgical change involving the left calcaneus is again noted with truncation of the posterior calcaneus. No acute fracture or dislocation. Large soft tissue ulcer is seen subjacent to the calcaneus, increased in size since prior examination. There is extensive soft tissue swelling of the left hindfoot. There has developed subtle periosteal reaction and sclerosis involving the inferior aspect of the calcaneus which may reflect changes of a chronic osteomyelitis in this location. IMPRESSION: Enlarging, large soft tissue ulcer involving the plantar aspect of the left hindfoot with subjacent sclerosis and subtle periosteal reaction involving the calcaneus suspicious for changes of chronic osteomyelitis in this location. Extensive surrounding soft tissue swelling. Electronically Signed   By: Fidela Salisbury M.D.    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 There has developed a large soft tissue ulceration involving the lateral, plantar aspect of the right midfoot subjacent to the a cuboid. No associated osseous  erosion to suggest osteomyelitis. There is extensive soft tissue swelling of the right mid and forefoot. Subcutaneous edema noted within the right ankle. There are multiple foci of gas noted within the subcutaneous soft tissues of the right forefoot dorsally and midfoot medially, possibly related to changes of direct trauma or aggressive infection. No acute fracture or dislocation. Moderate plantar and superior calcaneal spurs are noted. IMPRESSION: Soft tissue ulcer involving the lateral, plantar right midfoot. Extensive soft tissue swelling of the right foot and ankle. Subcutaneous gas within the right forefoot and midfoot remote from the ulcer may reflect changes of direct trauma or aggressive infection. Electronically Signed   By: Fidela Salisbury M.D.   On: 10/16/2021 19:55   CT CHEST ABDOMEN PELVIS WO CONTRAST  Result Date: 10/16/2021 CLINICAL DATA:  Altered mental status, fall, found down, abdominal distension. Abdominal pain, acute, nonlocalized Abdominal trauma, blunt. EXAM: CT CHEST, ABDOMEN AND PELVIS WITHOUT CONTRAST TECHNIQUE: Multidetector CT imaging of the chest, abdomen and pelvis was performed following the standard protocol without IV contrast. COMPARISON:  08/23/2021 FINDINGS: CT CHEST FINDINGS Cardiovascular: Extensive multi-vessel coronary artery calcification. Global cardiac size within normal limits. No pericardial effusion. Central pulmonary arteries are of normal caliber. Mild atherosclerotic calcification within the thoracic aorta. No aortic aneurysm. Mediastinum/Nodes: No enlarged mediastinal, hilar, or  axillary lymph nodes. Thyroid gland, trachea, and esophagus demonstrate no significant findings. Lungs/Pleura: Mild bibasilar atelectasis. Minimal left basilar pleural thickening superficial to acute left posterior rib fractures. The lungs are otherwise clear. No pneumothorax or pleural effusion. Central airways are widely patent. Musculoskeletal: There are acute mildly displaced fractures of the left ninth and tenth ribs posteriorly. CT ABDOMEN PELVIS FINDINGS Hepatobiliary: The liver contour is nodular in keeping with changes of cirrhosis. 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 No definite solid intrahepatic mass identified. No intrahepatic biliary ductal dilation. There is mild extrahepatic biliary ductal dilation, progressive since prior examination, with the extrahepatic bile duct measuring up to 10 mm in diameter. Pancreas: Unremarkable Spleen: The spleen is mildly enlarged measuring 14.1 cm in greatest dimension, stable since prior examination. No intrasplenic masses are seen. Adrenals/Urinary Tract: Adrenal glands are unremarkable. Kidneys are normal, without renal calculi, focal lesion, or hydronephrosis. Bladder is unremarkable. Stomach/Bowel: Stomach is within normal limits. Appendix appears normal. No evidence of bowel wall thickening, distention, or inflammatory changes. Vascular/Lymphatic: Aortic atherosclerosis. No enlarged abdominal or pelvic lymph nodes. Reproductive: Status post hysterectomy. No adnexal masses. Other: Tiny fat containing umbilical hernia.  No ascites. Musculoskeletal: L1 corpectomy and T11-L3 posterior lumbar fusion with instrumentation has been performed. No definite bridging callus or incorporation of interbody bone graft is identified along the fused segment. Additionally, there is periprosthetic lucency involving the pedicle screws of T11, L2, and L3 as well as fracture of the left pedicle screw of  T11 and migration of the proximal aspects of the T11 pedicle screws into the T10-11 disc space. Together, the findings suggest motion along the fused segment. No acute fracture identified. IMPRESSION: Acute mildly displaced fractures of the left ninth and tenth ribs posteriorly. No pneumothorax. Extensive multi-vessel coronary artery calcification. Cirrhosis. 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 L1 corpectomy and T11-L3 posterior lumbar fusion procedure with findings suggesting motion along the fused segment as noted above. This may be better assessed with dedicated CT imaging, if indicated. Aortic Atherosclerosis (ICD10-I70.0). Electronically Signed   By: Fidela Salisbury M.D.   On: 10/16/2021 19:50

## 2021-10-18 ENCOUNTER — Inpatient Hospital Stay (HOSPITAL_COMMUNITY): Payer: PRIVATE HEALTH INSURANCE

## 2021-10-18 DIAGNOSIS — B955 Unspecified streptococcus as the cause of diseases classified elsewhere: Secondary | ICD-10-CM

## 2021-10-18 DIAGNOSIS — R7881 Bacteremia: Secondary | ICD-10-CM

## 2021-10-18 DIAGNOSIS — M86472 Chronic osteomyelitis with draining sinus, left ankle and foot: Secondary | ICD-10-CM

## 2021-10-18 LAB — PREPARE RBC (CROSSMATCH)

## 2021-10-18 LAB — CBC WITH DIFFERENTIAL/PLATELET
Abs Immature Granulocytes: 0.13 10*3/uL — ABNORMAL HIGH (ref 0.00–0.07)
Basophils Absolute: 0 10*3/uL (ref 0.0–0.1)
Basophils Relative: 0 %
Eosinophils Absolute: 0.1 10*3/uL (ref 0.0–0.5)
Eosinophils Relative: 0 %
HCT: 21.3 % — ABNORMAL LOW (ref 36.0–46.0)
Hemoglobin: 7.1 g/dL — ABNORMAL LOW (ref 12.0–15.0)
Immature Granulocytes: 1 %
Lymphocytes Relative: 5 %
Lymphs Abs: 0.9 10*3/uL (ref 0.7–4.0)
MCH: 26.1 pg (ref 26.0–34.0)
MCHC: 33.3 g/dL (ref 30.0–36.0)
MCV: 78.3 fL — ABNORMAL LOW (ref 80.0–100.0)
Monocytes Absolute: 1.4 10*3/uL — ABNORMAL HIGH (ref 0.1–1.0)
Monocytes Relative: 9 %
Neutro Abs: 13.7 10*3/uL — ABNORMAL HIGH (ref 1.7–7.7)
Neutrophils Relative %: 85 %
Platelets: 181 10*3/uL (ref 150–400)
RBC: 2.72 MIL/uL — ABNORMAL LOW (ref 3.87–5.11)
RDW: 18.7 % — ABNORMAL HIGH (ref 11.5–15.5)
WBC: 16.1 10*3/uL — ABNORMAL HIGH (ref 4.0–10.5)
nRBC: 0 % (ref 0.0–0.2)

## 2021-10-18 LAB — ECHOCARDIOGRAM COMPLETE
Area-P 1/2: 2.69 cm2
Calc EF: 59.4 %
Height: 65 in
S' Lateral: 3.1 cm
Single Plane A2C EF: 59 %
Single Plane A4C EF: 61.9 %
Weight: 2659.63 oz

## 2021-10-18 LAB — C-REACTIVE PROTEIN: CRP: 17.4 mg/dL — ABNORMAL HIGH (ref <1.0)

## 2021-10-18 LAB — GLUCOSE, CAPILLARY
Glucose-Capillary: 105 mg/dL — ABNORMAL HIGH (ref 70–99)
Glucose-Capillary: 120 mg/dL — ABNORMAL HIGH (ref 70–99)
Glucose-Capillary: 131 mg/dL — ABNORMAL HIGH (ref 70–99)
Glucose-Capillary: 133 mg/dL — ABNORMAL HIGH (ref 70–99)
Glucose-Capillary: 136 mg/dL — ABNORMAL HIGH (ref 70–99)

## 2021-10-18 LAB — COMPREHENSIVE METABOLIC PANEL
ALT: 26 U/L (ref 0–44)
AST: 48 U/L — ABNORMAL HIGH (ref 15–41)
Albumin: 1.7 g/dL — ABNORMAL LOW (ref 3.5–5.0)
Alkaline Phosphatase: 99 U/L (ref 38–126)
Anion gap: 10 (ref 5–15)
BUN: 121 mg/dL — ABNORMAL HIGH (ref 8–23)
CO2: 23 mmol/L (ref 22–32)
Calcium: 6.9 mg/dL — ABNORMAL LOW (ref 8.9–10.3)
Chloride: 92 mmol/L — ABNORMAL LOW (ref 98–111)
Creatinine, Ser: 5.92 mg/dL — ABNORMAL HIGH (ref 0.44–1.00)
GFR, Estimated: 7 mL/min — ABNORMAL LOW (ref >60)
Glucose, Bld: 130 mg/dL — ABNORMAL HIGH (ref 70–99)
Potassium: 4.8 mmol/L (ref 3.5–5.1)
Sodium: 125 mmol/L — ABNORMAL LOW (ref 135–145)
Total Bilirubin: 0.9 mg/dL (ref 0.3–1.2)
Total Protein: 6.1 g/dL — ABNORMAL LOW (ref 6.5–8.1)

## 2021-10-18 LAB — HEPARIN LEVEL (UNFRACTIONATED)
Heparin Unfractionated: 0.11 IU/mL — ABNORMAL LOW (ref 0.30–0.70)
Heparin Unfractionated: <0.1 IU/mL — ABNORMAL LOW (ref 0.30–0.70)

## 2021-10-18 LAB — HEMOGLOBIN AND HEMATOCRIT, BLOOD
HCT: 24.5 % — ABNORMAL LOW (ref 36.0–46.0)
Hemoglobin: 8 g/dL — ABNORMAL LOW (ref 12.0–15.0)

## 2021-10-18 LAB — BRAIN NATRIURETIC PEPTIDE: B Natriuretic Peptide: 423.7 pg/mL — ABNORMAL HIGH (ref 0.0–100.0)

## 2021-10-18 LAB — PHOSPHORUS: Phosphorus: 7.1 mg/dL — ABNORMAL HIGH (ref 2.5–4.6)

## 2021-10-18 LAB — MAGNESIUM: Magnesium: 1.8 mg/dL (ref 1.7–2.4)

## 2021-10-18 MED ORDER — CHLORHEXIDINE GLUCONATE CLOTH 2 % EX PADS
6.0000 | MEDICATED_PAD | Freq: Every day | CUTANEOUS | Status: DC
  Administered 2021-10-18 – 2021-10-22 (×5): 6 via TOPICAL

## 2021-10-18 MED ORDER — METRONIDAZOLE 500 MG/100ML IV SOLN
500.0000 mg | Freq: Two times a day (BID) | INTRAVENOUS | Status: DC
Start: 2021-10-18 — End: 2021-10-19
  Administered 2021-10-18 – 2021-10-19 (×3): 500 mg via INTRAVENOUS
  Filled 2021-10-18 (×3): qty 100

## 2021-10-18 MED ORDER — SODIUM CHLORIDE 0.9% IV SOLUTION: Freq: Once | INTRAVENOUS | Status: DC

## 2021-10-18 MED ORDER — HEPARIN BOLUS VIA INFUSION
2000.0000 [IU] | Freq: Once | INTRAVENOUS | Status: AC
  Administered 2021-10-18: 11:00:00 2000 [IU] via INTRAVENOUS
  Filled 2021-10-18: qty 2000

## 2021-10-18 MED ORDER — PERFLUTREN LIPID MICROSPHERE
1.0000 mL | INTRAVENOUS | Status: AC | PRN
Start: 2021-10-18 — End: 2021-10-18
  Administered 2021-10-18: 16:00:00 2 mL via INTRAVENOUS
  Filled 2021-10-18: qty 10

## 2021-10-18 MED ORDER — FERROUS SULFATE 325 (65 FE) MG PO TABS
325.0000 mg | ORAL_TABLET | Freq: Two times a day (BID) | ORAL | Status: DC
  Administered 2021-10-18 – 2021-10-22 (×8): 325 mg via ORAL
  Filled 2021-10-18 (×8): qty 1

## 2021-10-18 MED ORDER — SODIUM CHLORIDE 0.9 % IV SOLN: INTRAVENOUS | Status: DC

## 2021-10-18 MED ORDER — MAGNESIUM SULFATE 2 GM/50ML IV SOLN
2.0000 g | Freq: Once | INTRAVENOUS | Status: AC
  Administered 2021-10-18: 11:00:00 2 g via INTRAVENOUS
  Filled 2021-10-18: qty 50

## 2021-10-18 MED ORDER — SODIUM CHLORIDE 0.9% IV SOLUTION: Freq: Once | INTRAVENOUS | Status: AC

## 2021-10-18 NOTE — Progress Notes (Signed)
[place] for Heparin Indication: Hx of DVT Brief A/P: Heparin level subtherapeutic Increase Heparin rate Follow-up am labs.  Allergies  Allergen Reactions   Bee Pollen Anaphylaxis   Fish-Derived Products Hives, Shortness Of Breath, Swelling and Rash    Hives get in throat causing trouble breathing   Mushroom Extract Complex Anaphylaxis   Penicillins Anaphylaxis    **Tolerated cefepime March 2021 Did it involve swelling of the face/tongue/throat, SOB, or low BP? Yes Did it involve sudden or severe rash/hives, skin peeling, or any reaction on the inside of your mouth or nose? No Did you need to seek medical attention at a hospital or doctor's office? Yes When did it last happen?  A few months ago If all above answers are "NO", may proceed with cephalosporin use.   Rosemary Oil Anaphylaxis   Shellfish Allergy Hives, Shortness Of Breath, Swelling and Rash   Tomato Hives and Shortness Of Breath    Hives in throat causes her trouble breathing   Acetaminophen Other (See Comments)    GI upset   Aloe Vera Hives   Broccoli [Brassica Oleracea] Hives   Acyclovir And Related Other (See Comments)    Unknown reaction   Naproxen Other (See Comments)    Unknown reaction    Patient Measurements: Height: 5' 5"  (165.1 cm) Weight: 75.8 kg (167 lb) IBW/kg (Calculated) : 57 Heparin Dosing Weight: 72.6 kg  Vital Signs: Temp: 98.3 F (36.8 C) (12/19 1725) Temp Source: Oral (12/19 1725) BP: 141/53 (12/19 2045) Pulse Rate: 68 (12/19 2045)  Labs: Recent Labs    10/16/21 1956 10/17/21 0617 10/17/21 1403 10/17/21 1804 10/17/21 2318  HGB 8.2*  --   --   --   --   HCT 25.6*  --   --   --   --   PLT 215  --   --   --   --   APTT  --   --  31 43*  --   LABPROT  --   --  16.7*  --   --   INR  --   --  1.4*  --   --   HEPARINUNFRC  --   --  <0.10* <0.10* <0.10*  CREATININE 8.24* 7.05*  --  6.55*  --   CKTOTAL  --  697*  --   --   --      Estimated  Creatinine Clearance (by C-G formula based on SCr of 6.55 mg/dL (H)) Female: 8.7 mL/min (A) Female: 10.7 mL/min (A)   Assessment: 65 y.o. adult with h/o DVT, oral anticoagulation on hold for surgery later this week, for heparin  Goal of Therapy:  Heparin level 0.3-0.7 units/ml Monitor platelets by anticoagulation protocol: Yes   Plan:  Increase Heparin 1550 units/hr Follow-up am labs.  Phillis Knack, PharmD, BCPS

## 2021-10-18 NOTE — Progress Notes (Signed)
°  Echocardiogram 2D Echocardiogram has been performed.  [name] [name] 10/18/2021, 3:39 PM

## 2021-10-18 NOTE — Progress Notes (Signed)
PROGRESS NOTE                                                                                                                                                                                                             Patient Demographics:    [name] [name], is a 65 y.o. adult, DOB - 12-26-1955, BWG:[id number]  Outpatient Primary MD for the patient is Carollee Herter, Alferd Apa, DO    LOS - 2  Admit date - 10/16/2021    Chief Complaint  Patient presents with   Altered Mental Status       Brief Narrative (HPI from H&P)    [name] [name] is a 65 y.o. adult with medical history significant for CKD 4, DMT2, CAD, HTN, fibromyalgia, neuropathy of legs, chronic ulcers of feet, history of DVTs, history of renal transplant, history of cirrhosis who presents for generalized weakness that is progressively worsened gradually over the last 2 weeks prior to hospitalization in the ER she was diagnosed with severe sepsis due to bilateral foot cellulitis, AKI on CKD 4, severe dehydration with hyponatremia.  She was admitted for further care   Subjective:   Patient in bed, appears comfortable, denies any headache, no fever, no chest pain or pressure, no shortness of breath , no abdominal pain. No new focal weakness.    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  Had detailed discussion with patient's husband on 10/18/2021, he is resistant for surgery and understands the risks and benefits.  He will talk to  the patient 1 more time today.  For now continue antibiotics.  2.  AKI on CKD/renal transplant with severe dehydration and hyponatremia.  Hydrate, Foley, renal on board.  Defer management of this issue to nephrology.  3.  Dyslipidemia.  Replaced on statin.  4.  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  For now heparin drip.  5.  Anemia of chronic disease worse  with heme dilution due to IV fluids.  After consent 1 unit of packed RBC placement on 10/18/2021, placed on ferrous sulfate and folic acid and monitor.    6. DM type II.  On Lantus and sliding scale will monitor and adjust   Lab Results  Component Value Date   HGBA1C 7.1 (H) 10/17/2021   CBG (last 3)  Recent Labs    10/18/21 0011 10/18/21 0445 10/18/21 0654  GLUCAP 133* 136* 131*         Condition - Extremely Guarded  Family Communication  : Husband [name] [phone] called 10/17/2021, 10/18/2021  Code Status :  Full  Consults  : Orthopedics, nephrology, ID  PUD Prophylaxis : PPI   Procedures  :            Disposition Plan  :    Status is: Inpatient  Remains inpatient appropriate because: Sepsis  DVT Prophylaxis  :        Lab Results  Component Value Date   PLT 181 10/18/2021    Diet :  Diet Order             Diet NPO time specified  Diet effective ____           DIET SOFT Room service appropriate? Yes; Fluid consistency: Thin  Diet effective now                    Inpatient Medications  Scheduled Meds:  sodium chloride   Intravenous Once   atorvastatin  10 mg Oral Daily   Chlorhexidine Gluconate Cloth  6 each Topical Daily   cholecalciferol  400 Units Oral Daily   folic acid  2 mg Oral Daily   insulin aspart  0-6 Units Subcutaneous Q6H   insulin glargine-yfgn  20 Units Subcutaneous Q2200   pantoprazole  40 mg Oral Daily   Continuous Infusions:  ceFEPime (MAXIPIME) IV 1 g (10/18/21 0206)   DAPTOmycin (CUBICIN)  IV     heparin 1,550 Units/hr (10/18/21 0038)   lactated ringers 50  mL/hr at 10/17/21 2252   sodium bicarbonate 150 mEq in D5W infusion 100 mL/hr at 10/17/21 2239   PRN Meds:.acetaminophen, HYDROmorphone (DILAUDID) injection, ondansetron **OR** ondansetron (ZOFRAN) IV, senna-docusate, traMADol  Antibiotics  :    Anti-infectives (From admission, onward)    Start     Dose/Rate Route Frequency Ordered Stop   10/18/21 2000  DAPTOmycin (CUBICIN) 600 mg in sodium chloride 0.9 % IVPB        8 mg/kg  75.8 kg 124 mL/hr over 30 Minutes Intravenous Every 48 hours 10/16/21 2245     10/16/21 2330  DAPTOmycin (CUBICIN) 600 mg in sodium chloride 0.9 % IVPB        8 mg/kg  75.8 kg 124 mL/hr over 30 Minutes Intravenous  Once 10/16/21 2239 10/17/21 0358   10/16/21 2300  ceFEPIme (MAXIPIME) 1 g in sodium chloride 0.9 % 100 mL IVPB        1 g 200 mL/hr over 30 Minutes Intravenous Every 24 hours 10/16/21 2239     10/16/21 2045  vancomycin (VANCOREADY) IVPB 1500 mg/300 mL  Status:  Discontinued        1,500 mg 150 mL/hr over 120 Minutes Intravenous  Once 10/16/21 2006 10/16/21 2218   10/16/21 2015  ciprofloxacin (CIPRO) IVPB 400 mg        400 mg 200 mL/hr over 60 Minutes Intravenous  Once 10/16/21 2006 10/16/21 2223   10/16/21 2015  clindamycin (CLEOCIN)  IVPB 600 mg        600 mg 100 mL/hr over 30 Minutes Intravenous  Once 10/16/21 2006 10/16/21 2116        Time Spent in minutes  30   Lala Lund M.D on 10/18/2021 at 9:31 AM  To page go to [email or web address]   [facility] -  Office  [phone]  See all Orders from today for further details    Objective:   Vitals:   10/18/21 0005 10/18/21 0445 10/18/21 0500 10/18/21 0806  BP: (!) 159/65 (!) 114/54  (!) 133/55  Pulse: 95 81    Resp: 15 14    Temp: 97.6 F (36.4 C) 98.5 F (36.9 C)  98.4 F (36.9 C)  TempSrc: Oral Oral  Oral  SpO2: (!) 88% 97%    Weight:   75.4 kg   Height:        Wt Readings from Last 3 Encounters:  10/18/21 75.4 kg  09/21/21 74.4 kg  07/29/21 73.5 kg      Intake/Output Summary (Last 24 hours) at 10/18/2021 0931 Last data filed at 10/18/2021 0440 Gross per 24 hour  Intake 3950 ml  Output 3000 ml  Net 950 ml     Physical Exam  Awake Alert x2, No new F.N deficits,   [place].AT,PERRAL Supple Neck, No JVD,   Symmetrical Chest wall movement, Good air movement bilaterally, CTAB RRR,No Gallops, Rubs or new Murmurs,  +ve B.Sounds, Abd Soft, No tenderness,     Foot pictures from 10/17/2021 placed after consent       RN pressure injury documentation: Pressure Injury 04/16/19 Toe (Comment  which one) Left Deep Tissue Injury - Purple or maroon localized area of discolored intact skin or blood-filled blister due to damage of underlying soft tissue from pressure and/or shear. maroon coloring to tip of to (Active)  04/16/19 1500  Location: Toe (Comment  which one) (L great toe)  Location Orientation: Left  Staging: Deep Tissue Injury - Purple or maroon localized area of discolored intact skin or blood-filled blister due to damage of underlying soft tissue from pressure and/or shear.  Wound Description (Comments): maroon coloring to tip of toe  Present on Admission: Yes     Pressure Injury 04/16/19 Toe (Comment  which one) Anterior;Left Stage II -  Partial thickness loss of dermis presenting as a shallow open ulcer with a red, pink wound bed without slough. appears as opened blister - rubbed from cast (Active)  04/16/19 1500  Location: Toe (Comment  which one) (5th toe - pinky)  Location Orientation: Anterior;Left  Staging: Stage II -  Partial thickness loss of dermis presenting as a shallow open ulcer with a red, pink wound bed without slough.  Wound Description (Comments): appears as opened blister - rubbed from cast  Present on Admission: Yes     Data Review:    CBC Recent Labs  Lab 10/16/21 1956 10/18/21 0719  WBC 42.1* 16.1*  HGB 8.2* 7.1*  HCT 25.6* 21.3*  PLT 215 181  MCV 82.8 78.3*  MCH 26.5 26.1  MCHC 32.0 33.3   RDW 18.6* 18.7*  LYMPHSABS 0.0* 0.9  MONOABS 1.3* 1.4*  EOSABS 0.0 0.1  BASOSABS 0.0 0.0    Electrolytes Recent Labs  Lab 10/16/21 1956 10/17/21 0617 10/17/21 0852 10/17/21 1403 10/17/21 1804 10/18/21 0719  NA 120* 125*  --   --  125* 125*  K 6.6* 5.4*  --   --  4.4 4.8  CL 93* 97*  --   --  94* 92*  CO2 15* 18*  --   --  20* 23  GLUCOSE 181* 153*  --   --  139* 130*  BUN 141* 135*  --   --  129* 121*  CREATININE 8.24* 7.05*  --   --  6.55* 5.92*  CALCIUM 7.8* 7.0*  --   --  7.1* 6.9*  AST 65* 63*  --   --   --  48*  ALT 28 25  --   --   --  26  ALKPHOS 144* 126  --   --   --  99  BILITOT 0.7 1.0  --   --   --  0.9  ALBUMIN 1.9* 1.6*  --   --   --  1.7*  MG  --   --  2.0  --   --  1.8  LATICACIDVEN 1.1  --   --   --   --   --   INR  --   --   --  1.4*  --   --   HGBA1C  --  7.1*  --   --   --   --   BNP  --   --   --   --   --  423.7*    ------------------------------------------------------------------------------------------------------------------ No results for input(s): CHOL, HDL, LDLCALC, TRIG, CHOLHDL, LDLDIRECT in the last 72 hours.  Lab Results  Component Value Date   HGBA1C 7.1 (H) 10/17/2021    No results for input(s): TSH, T4TOTAL, T3FREE, THYROIDAB in the last 72 hours.  Invalid input(s): FREET3 ------------------------------------------------------------------------------------------------------------------ ID Labs Recent Labs  Lab 10/16/21 1956 10/17/21 0617 10/17/21 1804 10/18/21 0719  WBC 42.1*  --   --  16.1*  PLT 215  --   --  181  LATICACIDVEN 1.1  --   --   --   CREATININE 8.24* 7.05* 6.55* 5.92*   Cardiac Enzymes No results for input(s): CKMB, TROPONINI, MYOGLOBIN in the last 168 hours.  Invalid input(s): CK   Radiology Reports DG Chest 1 View  Result Date: 10/16/2021 CLINICAL DATA:  Weakness, known left rib fractures EXAM: CHEST  1 VIEW COMPARISON:  None. FINDINGS: Lungs are well expanded, symmetric, and clear. No  pneumothorax or pleural effusion. Cardiac size within normal limits. Pulmonary vascularity is normal. Osseous structures are age-appropriate. Known left ninth and tenth rib fractures is not well visualized on this examination. Healed left clavicular mid-diaphyseal fracture noted. IMPRESSION: No active disease. Known left ninth and tenth rib fractures are not well visualized on this exam. Electronically Signed   By: Fidela Salisbury M.D.   On: 10/16/2021 19:52   CT Head Wo Contrast  Result Date: 10/16/2021 CLINICAL DATA:  Increasing weakness over the past week with subsequent fall, initial encounter EXAM: CT HEAD WITHOUT CONTRAST CT CERVICAL SPINE WITHOUT CONTRAST TECHNIQUE: Multidetector CT imaging of the head and cervical spine was performed following the standard protocol without intravenous contrast. Multiplanar CT image reconstructions of the cervical spine were also generated. COMPARISON:  02/13/2020 FINDINGS: CT HEAD FINDINGS Brain: No evidence of acute infarction, hemorrhage, hydrocephalus, extra-axial collection or mass lesion/mass effect. Vascular: No hyperdense vessel or unexpected calcification. Skull: Normal. Negative for fracture or focal lesion. Sinuses/Orbits: No acute finding. Other: None. CT CERVICAL SPINE FINDINGS Alignment: Within normal limits. Skull base and vertebrae: 7 cervical segments are well visualized. Vertebral body height is well maintained. Disc space narrowing with osteophytic changes are noted at C5-6. Mild facet hypertrophic changes are noted. No acute  fracture or acute facet abnormality is noted. Soft tissues and spinal canal: No prevertebral fluid or swelling. No visible canal hematoma. Upper chest: Visualized lung apices are within normal limits Other: None IMPRESSION: CT of the head: No acute intracranial abnormality noted. CT of the cervical spine: Mild degenerative changes without acute abnormality. Electronically Signed   By: Inez Catalina M.D.   On: 10/16/2021 19:58   CT  Cervical Spine Wo Contrast  Result Date: 10/16/2021 CLINICAL DATA:  Increasing weakness over the past week with subsequent fall, initial encounter EXAM: CT HEAD WITHOUT CONTRAST CT CERVICAL SPINE WITHOUT CONTRAST TECHNIQUE: Multidetector CT imaging of the head and cervical spine was performed following the standard protocol without intravenous contrast. Multiplanar CT image reconstructions of the cervical spine were also generated. COMPARISON:  02/13/2020 FINDINGS: CT HEAD FINDINGS Brain: No evidence of acute infarction, hemorrhage, hydrocephalus, extra-axial collection or mass lesion/mass effect. Vascular: No hyperdense vessel or unexpected calcification. Skull: Normal. Negative for fracture or focal lesion. Sinuses/Orbits: No acute finding. Other: None. CT CERVICAL SPINE FINDINGS Alignment: Within normal limits. Skull base and vertebrae: 7 cervical segments are well visualized. Vertebral body height is well maintained. Disc space narrowing with osteophytic changes are noted at C5-6. Mild facet hypertrophic changes are noted. No acute fracture or acute facet abnormality is noted. Soft tissues and spinal canal: No prevertebral fluid or swelling. No visible canal hematoma. Upper chest: Visualized lung apices are within normal limits Other: None IMPRESSION: CT of the head: No acute intracranial abnormality noted. CT of the cervical spine: Mild degenerative changes without acute abnormality. Electronically Signed   By: Inez Catalina M.D.   On: 10/16/2021 19:58   MR FOOT RIGHT WO CONTRAST  Result Date: 10/17/2021 CLINICAL DATA:  Diabetic foot ulcer EXAM: MRI OF THE RIGHT FOREFOOT WITHOUT CONTRAST TECHNIQUE: Multiplanar, multisequence MR imaging of the right forefoot was performed. No intravenous contrast was administered. COMPARISON:  X-ray 10/16/2021 FINDINGS: Bones/Joint/Cartilage Postsurgical changes of prior fifth ray resection. Extensive bone marrow edema with intermediate-low T1 signal changes at the fourth  metatarsal base and proximal diaphysis as well as throughout the adjacent cuboid, compatible with acute osteomyelitis. Fluid within the fourth TMT joint space suggests septic arthritis. Patchy marrow edema throughout the remaining bones of the midfoot including the cuneiform bones and lateral portion of the navicular bone. Similar patchy marrow edema involves the bases of the first, second, and third metatarsals. There are areas of intermediate T1 signal intensity within these bones which favor a component of early acute osteomyelitis. No acute fracture or dislocation. Ligaments Lisfranc ligament is poorly defined. Collateral ligaments of the MTP joints are intact. Muscles and Tendons Marked edematous changes of the visualized foot musculature likely reflecting a combination of myositis and denervation. No focal tenosynovial fluid collection. Soft tissues Soft tissue ulceration at the dorso lateral aspect of the forefoot overlying the level of the fourth metatarsal diaphysis. Additional deep soft tissue ulceration along the plantar aspect of the forefoot underlying the level of the fourth metatarsal base with tract extending to the underlying bone. Two small fluid collections within the soft tissues near the level of the fourth TMT joint measuring up to 1.3 cm. Diffuse circumferential soft tissue edema. Numerous foci of susceptibility within the soft tissues in the region of the midfoot compatible with subcutaneous emphysema seen radiographically. IMPRESSION: 1. Acute osteomyelitis of the fourth metatarsal base and proximal diaphysis as well as throughout the adjacent cuboid. Fluid within the fourth TMT joint suggests septic arthritis. 2. Patchy marrow edema  throughout the remaining bones of the midfoot as well as the metatarsal bases. Findings favor a component of early acute osteomyelitis. 3. Soft tissue ulcerations at the dorsolateral aspect of the forefoot overlying the level of the fourth metatarsal diaphysis.  Additional deep soft tissue ulceration along the plantar aspect of the forefoot underlying the level of the fourth metatarsal base with tract extending to the underlying bone. Two small abscesses within the soft tissues near the level of the fourth TMT joint measuring up to 1.3 cm. 4. Numerous foci of susceptibility within the soft tissues in the region of the midfoot compatible with subcutaneous emphysema seen radiographically. 5. Marked edematous changes of the visualized foot musculature likely reflecting a combination of myositis and denervation. Electronically Signed   By: Davina Poke D.O.   On: 10/17/2021 08:20   DG Foot Complete Left  Result Date: 10/16/2021 CLINICAL DATA:  Bilateral foot wound EXAM: LEFT FOOT - COMPLETE 3+ VIEW COMPARISON:  None. FINDINGS: Posttraumatic or postsurgical change involving the left calcaneus is again noted with truncation of the posterior calcaneus. No acute fracture or dislocation. Large soft tissue ulcer is seen subjacent to the calcaneus, increased in size since prior examination. There is extensive soft tissue swelling of the left hindfoot. There has developed subtle periosteal reaction and sclerosis involving the inferior aspect of the calcaneus which may reflect changes of a chronic osteomyelitis in this location. IMPRESSION: Enlarging, large soft tissue ulcer involving the plantar aspect of the left hindfoot with subjacent sclerosis and subtle periosteal reaction involving the calcaneus suspicious for changes of chronic osteomyelitis in this location. Extensive surrounding soft tissue swelling. Electronically Signed   By: Fidela Salisbury M.D.   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 There has developed a large soft tissue ulceration involving the lateral, plantar aspect of the  right midfoot subjacent to the a cuboid. No associated osseous erosion to suggest osteomyelitis. There is extensive soft tissue swelling of the right mid and forefoot. Subcutaneous edema noted within the right ankle. There are multiple foci of gas noted within the subcutaneous soft tissues of the right forefoot dorsally and midfoot medially, possibly related to changes of direct trauma or aggressive infection. No acute fracture or dislocation. Moderate plantar and superior calcaneal spurs are noted. IMPRESSION: Soft tissue ulcer involving the lateral, plantar right midfoot. Extensive soft tissue swelling of the right foot and ankle. Subcutaneous gas within the right forefoot and midfoot remote from the ulcer may reflect changes of direct trauma or aggressive infection. Electronically Signed   By: Fidela Salisbury M.D.   On: 10/16/2021 19:55   CT CHEST ABDOMEN PELVIS WO CONTRAST  Result Date: 10/16/2021 CLINICAL DATA:  Altered mental status, fall, found down, abdominal distension. Abdominal pain, acute, nonlocalized Abdominal trauma, blunt. EXAM: CT CHEST, ABDOMEN AND PELVIS WITHOUT CONTRAST TECHNIQUE: Multidetector CT imaging of the chest, abdomen and pelvis was performed following the standard protocol without IV contrast. COMPARISON:  08/23/2021 FINDINGS: CT CHEST FINDINGS Cardiovascular: Extensive multi-vessel coronary artery calcification. Global cardiac size within normal limits. No pericardial effusion. Central pulmonary arteries are of normal caliber. Mild atherosclerotic calcification within the thoracic aorta. No aortic aneurysm. Mediastinum/Nodes: No enlarged mediastinal, hilar, or axillary lymph nodes. Thyroid gland, trachea, and esophagus demonstrate no significant findings. Lungs/Pleura: Mild bibasilar atelectasis. Minimal left basilar pleural thickening superficial to acute  left posterior rib fractures. The lungs are otherwise clear. No pneumothorax or pleural effusion. Central airways are widely  patent. Musculoskeletal: There are acute mildly displaced fractures of the left ninth and tenth ribs posteriorly. CT ABDOMEN PELVIS FINDINGS Hepatobiliary: The liver contour is nodular in keeping with changes of cirrhosis. 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 No definite solid intrahepatic mass identified. No intrahepatic biliary ductal dilation. There is mild extrahepatic biliary ductal dilation, progressive since prior examination, with the extrahepatic bile duct measuring up to 10 mm in diameter. Pancreas: Unremarkable Spleen: The spleen is mildly enlarged measuring 14.1 cm in greatest dimension, stable since prior examination. No intrasplenic masses are seen. Adrenals/Urinary Tract: Adrenal glands are unremarkable. Kidneys are normal, without renal calculi, focal lesion, or hydronephrosis. Bladder is unremarkable. Stomach/Bowel: Stomach is within normal limits. Appendix appears normal. No evidence of bowel wall thickening, distention, or inflammatory changes. Vascular/Lymphatic: Aortic atherosclerosis. No enlarged abdominal or pelvic lymph nodes. Reproductive: Status post hysterectomy. No adnexal masses. Other: Tiny fat containing umbilical hernia.  No ascites. Musculoskeletal: L1 corpectomy and T11-L3 posterior lumbar fusion with instrumentation has been performed. No definite bridging callus or incorporation of interbody bone graft is identified along the fused segment. Additionally, there is periprosthetic lucency involving the pedicle screws of T11, L2, and L3 as well as fracture of the left pedicle screw of T11 and migration of the proximal aspects of the T11 pedicle screws into the T10-11 disc space. Together, the findings suggest motion along the fused segment. No acute fracture identified. IMPRESSION: Acute mildly displaced fractures of the left ninth and tenth ribs posteriorly. No pneumothorax. Extensive multi-vessel  coronary artery calcification. Cirrhosis. 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 L1 corpectomy and T11-L3 posterior lumbar fusion procedure with findings suggesting motion along the fused segment as noted above. This may be better assessed with dedicated CT imaging, if indicated. Aortic Atherosclerosis (ICD10-I70.0). Electronically Signed   By: Fidela Salisbury M.D.   On: 10/16/2021 19:50

## 2021-10-18 NOTE — TOC Initial Note (Incomplete)
Transition of Care [facility]) - Initial/Assessment Note    Patient Details  Name: [name] [name] MRN: [id number] Date of Birth: 1956-10-08  Transition of Care [facility]) CM/SW Contact:    Sharin Mons, RN Phone Number: 10/18/2021, 1:46 PM  Clinical Narrative:                   Expected Discharge Plan: [place] Barriers to Discharge: Continued Medical Work up   Patient Goals and CMS Choice        Expected Discharge Plan and Services Expected Discharge Plan: [place]   Discharge Planning Services: CM Consult                                 [facility] Agency: [place] Date [facility] Agency Contacted: 10/18/21 Time HH Agency Contacted: [phone] Representative spoke with at [place]: Tommi Rumps  Prior Living Arrangements/Services     Patient language and need for interpreter reviewed:: Yes Do you feel safe going back to the place where you live?: Yes      Need for Family Participation in Patient Care: Yes (Comment) Care giver support system in place?: Yes (comment) Current home services: DME Criminal Activity/Legal Involvement Pertinent to Current Situation/Hospitalization: No - Comment as needed  Activities of Daily Living      Permission Sought/Granted      Share Information with NAME: Sandrika Schwinn (Spouse) [phone]           Emotional Assessment Appearance:: Appears stated age Attitude/Demeanor/Rapport: Engaged Affect (typically observed): Pleasant Orientation: : Oriented to Self, Oriented to Place, Oriented to  Time, Oriented to Situation Alcohol / Substance Use: Not Applicable Psych Involvement: No (comment)  Admission diagnosis:  Dehydration [E86.0] Weakness [R53.1] Osteomyelitis of left foot (HCC) [M86.9] Diabetic infection of left foot ([place]) [S56.812, L08.9] Diabetic infection of right foot ([place]) [X51.700, L08.9] Closed fracture of multiple ribs of left side, initial encounter [S22.42XA] Patient Active  Problem List   Diagnosis Date Noted   Severe protein-calorie malnutrition ([place])    Severe sepsis ([place])    Osteomyelitis of left foot ([place]) 10/16/2021   Cellulitis of right foot 10/16/2021   Hyponatremia 10/16/2021   Hyperkalemia 10/16/2021   Acute kidney injury superimposed on CKD ([place]) 10/16/2021   Leukocytosis 10/16/2021   Diabetic foot ulcers ([place]) 10/16/2021   DM (diabetes mellitus), type 2 with renal complications ([place]) 17/49/4496   Generalized weakness 10/16/2021   Portal hypertension ([place]) 08/16/2021   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[place]) 08/16/2021   Eczema of scalp 06/27/2021   Hypothyroidism 06/27/2021   Pruritus 06/27/2021   Seasonal allergic rhinitis due to pollen 02/02/2021   CRF (chronic renal failure), stage 4 (severe) ([place]) 02/02/2021   PTSD (post-traumatic stress disorder)    Osteoarthritis    MRSA (methicillin resistant Staphylococcus aureus)    Hypertension    High cholesterol    Hepatic steatosis    GERD (gastroesophageal reflux disease)    Fibromyalgia    Fatty liver    Exertional shortness of breath    Episode of visual loss of left eye    Depression    Dehiscence of closure of skin    Constipation    Chronic kidney disease    Cataract    Bulging lumbar disc    Asthma    Anxiety    Anginal pain (HCC)    Anemia    Tinea  capitis 10/11/2020   Tinea corporis 10/11/2020   SOB (shortness of breath) 10/11/2020   Hip pain 05/16/2020   Diabetic infection of left foot ([place]) 05/12/2020   IDA (iron deficiency anemia) 05/04/2020   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 ([place]) 03/16/2020   S/P lumbar fusion 03/04/2020   Spinal stenosis of lumbar region 01/22/2020   Osteomyelitis of vertebra of thoracolumbar region ([place]) 01/21/2020   Back pain 11/17/2019   Compression fracture of L1 lumbar vertebra ([place]) 11/17/2019   Fracture of multiple ribs 11/17/2019   Intractable nausea and vomiting 11/16/2019   AKI (acute  kidney injury) ([place]) 11/15/2019   Nausea & vomiting 11/15/2019   Diarrhea 09/04/2019   Pressure injury of skin 04/17/2019   Septic arthritis of elbow, right ([place]) 04/17/2019   Retinopathy 04/17/2019   Renal transplant, 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   Sleep apnea 11/16/2017   Diabetic foot infection ([place]) 04/06/2017   Type 2 diabetes mellitus with hyperglycemia, with long-term current use of insulin ([place]) 04/06/2017   Ulcer of left heel, limited to breakdown of skin ([place]) 02/01/2017   Neuropathy 05/05/2015   Renal insufficiency 05/05/2015   Diabetic peripheral neuropathy ([place]) 01/12/2015   CKD (chronic kidney disease), stage III ([place]) 05/27/2014   History of MI (myocardial infarction) 10/06/2013   Nausea and vomiting 10/06/2013   Obesity (BMI 30-39.9) 09/20/2013   Multinodular goiter 04/17/2013   Normocytic anemia 02/03/2013   Proximal humerus fracture 10/15/2012   CAD (coronary artery disease) 01/11/2012   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 [address] [place] [address] Phone: [phone] Fax: [phone]  [id number] - [place], [place] [address] [name] [address] Phone: [phone] Fax: [phone]     Social Determinants of Health (SDOH) Interventions    Readmission Risk Interventions Readmission Risk Prevention Plan 01/23/2020 11/18/2019  Transportation Screening Complete Complete  Medication Review Press photographer) Complete Referral to Pharmacy  PCP or Specialist appointment within 3-5  days of discharge Complete Complete  HRI or Home Care Consult Complete Complete  SW Recovery Care/Counseling Consult Complete Complete  Palliative Care Screening Complete Not Applicable  [place] Complete Not Applicable  Some recent data might be hidden

## 2021-10-18 NOTE — Progress Notes (Signed)
ANTICOAGULATION CONSULT NOTE- follow-up  Pharmacy Consult for Heparin Indication: hx of DVT  Allergies  Allergen Reactions   Bee Pollen Anaphylaxis   Fish-Derived Products Hives, Shortness Of Breath, Swelling and Rash    Hives get in throat causing trouble breathing   Mushroom Extract Complex Anaphylaxis   Penicillins Anaphylaxis    **Tolerated cefepime March 2021 Did it involve swelling of the face/tongue/throat, SOB, or low BP? Yes Did it involve sudden or severe rash/hives, skin peeling, or any reaction on the inside of your mouth or nose? No Did you need to seek medical attention at a hospital or doctor's office? Yes When did it last happen?  A few months ago If all above answers are "NO", may proceed with cephalosporin use.   Rosemary Oil Anaphylaxis   Shellfish Allergy Hives, Shortness Of Breath, Swelling and Rash   Tomato Hives and Shortness Of Breath    Hives in throat causes her trouble breathing   Acetaminophen Other (See Comments)    GI upset   Aloe Vera Hives   Broccoli [Brassica Oleracea] Hives   Acyclovir And Related Other (See Comments)    Unknown reaction   Naproxen Other (See Comments)    Unknown reaction    Patient Measurements: Height: 5' 5"  (165.1 cm) Weight: 75.4 kg (166 lb 3.6 oz) IBW/kg (Calculated) : 57  Heparin Dosing Weight: 72.6 kg  Vital Signs: Temp: 98.6 F (37 C) (12/20 1500) Temp Source: Oral (12/20 1500) BP: 138/58 (12/20 1500) Pulse Rate: 78 (12/20 1500)  Labs: Recent Labs    10/16/21 1956 10/17/21 0617 10/17/21 1403 10/17/21 1403 10/17/21 1804 10/17/21 2318 10/18/21 0719 10/18/21 1830  HGB 8.2*  --   --   --   --   --  7.1* 8.0*  HCT 25.6*  --   --   --   --   --  21.3* 24.5*  PLT 215  --   --   --   --   --  181  --   APTT  --   --  31  --  43*  --   --   --   LABPROT  --   --  16.7*  --   --   --   --   --   INR  --   --  1.4*  --   --   --   --   --   HEPARINUNFRC  --   --  <0.10*   < > <0.10* <0.10* 0.11* <0.10*   CREATININE 8.24* 7.05*  --   --  6.55*  --  5.92*  --   CKTOTAL  --  697*  --   --   --   --   --   --    < > = values in this interval not displayed.     Estimated Creatinine Clearance (by C-G formula based on SCr of 5.92 mg/dL (H)) Female: 9.6 mL/min (A) Female: 11.8 mL/min (A)  Assessment: 65 y.o. adult with a PMH significant for CKD 4 and hx of DVTs who presented with AMS. Heparin per pharmacy consult placed for hx of DVT.  On 10/18/2021 @ 20:07 this [place] spoke with the nurse providing care to this patient. It was explained to me that the heparin had leaked from her IV site and went all over her bed. She was unable to provide a timeframe for this event. Given this information, I have doubts to the accuracy of this level.  Goal of Therapy:  Heparin level 0.3-0.7 units/ml Monitor platelets by anticoagulation protocol: Yes   Plan:  Given the above assessment regarding the heparin leaking, I will do the following:  Continue heparin infusion to 1750 units/hr Check heparin level in 8 hours and daily while on heparin Continue to monitor H&H and platelets    Thank you for allowing pharmacy to be a part of this patients care.  Vaughan Basta BS, PharmD, BCPS Clinical Pharmacist 10/18/2021 20:13

## 2021-10-18 NOTE — Progress Notes (Signed)
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  Chest diabetic foot ulcer right foot with changes consistent with possible osteomyelitis in the left heel.  Baseline creatinine 3.5 mg/dL.  Increased to 8 mg/dL.  She was also hyperkalemic with a potassium of 6.5 and hyponatremic with a sodium of 120.  She follows with Dr. Carolin Sicks at [facility].  Clinical evaluation in the emergency room was consistent with volume depletion.  She was started on IV fluids.  No urgent indication for renal replacement therapy.  Temporizing measures for her hyperkalemia and treatment of her hyponatremia with IV saline.  Blood pressure 133/55 pulse 75 temperature 98.4 O2 sats 97% nasal cannula  Urine output 2.1 L 10/17/2021  Sodium 125 potassium 4.8 chloride 92 CO2 23 BUN 121 creatinine 5.92 glucose 130 calcium 6.9 phosphorus 7.1 magnesium 1.8 albumin 1.7 hemoglobin 7.1  Medications: Lipitor 10 mg daily, insulin, Lokelma 10 g 3 times daily   IV Maxipime IV Cipro  IV clindamycin IV daptomycin  IV sodium bicarbonate at 100 cc an hour  Urethral catheter in place  Objective:  Vital signs in last 24 hours:  Temp:  [97.6 F (36.4 C)-98.5 F (36.9 C)] 98.4 F (36.9 C) (12/20 0806) Pulse Rate:  [59-95] 81 (12/20 0445) Resp:  [10-18] 14 (12/20 0445) BP: (111-159)/(52-68) 133/55 (12/20 0806) SpO2:  [88 %-99 %] 97 % (12/20 0445) Weight:  [75.4 kg-75.8 kg] 75.4 kg (12/20 0500)  Weight change: 0 kg Filed Weights   10/16/21 2223 10/17/21 1053 10/18/21 0500  Weight: 75.8 kg 75.8 kg 75.4 kg    Intake/Output: I/O last 3 completed shifts: In: 2297 [P.O.:300; I.V.:1150; IV Piggyback:2500] Out: 3000 [Urine:3000]   Intake/Output this shift:  No intake/output data recorded.  CVS- RRR no murmurs rubs gallops RS-  CTA no wheezes or rales ABD- BS present soft non-distended no ascites or masses EXT- no edema right foot with large ulcer   Basic Metabolic Panel: Recent Labs  Lab 10/16/21 1956 10/17/21 0617 10/17/21 0852 10/17/21 1804 10/18/21 0719  NA 120* 125*  --  125* 125*  K 6.6* 5.4*  --  4.4 4.8  CL 93* 97*  --  94* 92*  CO2 15* 18*  --  20* 23  GLUCOSE 181* 153*  --  139* 130*  BUN 141* 135*  --  129* 121*  CREATININE 8.24* 7.05*  --  6.55* 5.92*  CALCIUM 7.8* 7.0*  --  7.1* 6.9*  MG  --   --  2.0  --  1.8  PHOS  --  8.3*  --   --  7.1*     Liver Function Tests: Recent Labs  Lab 10/16/21 1956 10/17/21 0617 10/18/21 0719  AST 65* 63* 48*  ALT 28 25 26   ALKPHOS 144* 126 99  BILITOT 0.7 1.0 0.9  PROT 7.0 6.1* 6.1*  ALBUMIN 1.9* 1.6* 1.7*    Recent Labs  Lab 10/16/21 1956  LIPASE 42    No results for input(s): AMMONIA in the last 168 hours.  CBC: Recent Labs  Lab 10/16/21 1956 10/18/21 0719  WBC 42.1* 16.1*  NEUTROABS 40.8* 13.7*  HGB 8.2* 7.1*  HCT 25.6* 21.3*  MCV 82.8 78.3*  PLT 215 181     Cardiac Enzymes: Recent Labs  Lab 10/17/21 0617  CKTOTAL 697*     BNP: Invalid input(s): POCBNP  CBG: Recent Labs  Lab 10/17/21 1326 10/17/21 2245 10/18/21 0011 10/18/21 0445 10/18/21 0654  GLUCAP 156* 151* 133* 136* 131*     Microbiology: Results for orders placed or performed during the hospital encounter of 10/16/21  Culture, blood (Routine X 2) w Reflex to ID Panel     Status: None (Preliminary result)   Collection Time: 10/16/21  7:56 PM   Specimen: BLOOD  Result Value Ref Range Status   Specimen Description BLOOD RIGHT ANTECUBITAL  Final   Special Requests   Final    BOTTLES DRAWN AEROBIC AND ANAEROBIC Blood Culture adequate volume   Culture  Setup Time   Final    GRAM POSITIVE COCCI IN CHAINS ANAEROBIC BOTTLE ONLY Organism ID to follow CRITICAL RESULT CALLED TO, READ BACK BY AND VERIFIED WITH: Salome Holmes [facility] 2106 10/17/21 A  BROWNING IN BOTH AEROBIC AND ANAEROBIC BOTTLES    Culture   Final    GRAM POSITIVE COCCI IDENTIFICATION TO FOLLOW Performed at [facility], [place] [address] [place], [place] [address]    Report Status PENDING  Incomplete  Blood Culture ID Panel (Reflexed)     Status: Abnormal   Collection Time: 10/16/21  7:56 PM  Result Value Ref Range Status   Enterococcus faecalis NOT DETECTED NOT DETECTED Final   Enterococcus Faecium NOT DETECTED NOT DETECTED Final   Listeria monocytogenes NOT DETECTED NOT DETECTED Final   Staphylococcus species NOT DETECTED NOT DETECTED Final   Staphylococcus aureus (BCID) NOT DETECTED NOT DETECTED Final   Staphylococcus epidermidis NOT DETECTED NOT DETECTED Final   Staphylococcus lugdunensis NOT DETECTED NOT DETECTED Final   Streptococcus species DETECTED (A) NOT DETECTED Final    Comment: Not Enterococcus species, Streptococcus agalactiae, Streptococcus pyogenes, or Streptococcus pneumoniae. CRITICAL RESULT CALLED TO, READ BACK BY AND VERIFIED WITH: Salome Holmes [facility] 2106 10/17/21 A BROWNING    Streptococcus agalactiae NOT DETECTED NOT DETECTED Final   Streptococcus pneumoniae NOT DETECTED NOT DETECTED Final   Streptococcus pyogenes NOT DETECTED NOT DETECTED Final   A.calcoaceticus-baumannii NOT DETECTED NOT DETECTED Final   Bacteroides fragilis NOT DETECTED NOT DETECTED Final   Enterobacterales NOT DETECTED NOT DETECTED Final   Enterobacter cloacae complex NOT DETECTED NOT DETECTED Final   Escherichia coli NOT DETECTED NOT DETECTED Final   Klebsiella aerogenes NOT DETECTED NOT DETECTED Final   Klebsiella oxytoca NOT DETECTED NOT DETECTED Final   Klebsiella pneumoniae NOT DETECTED NOT DETECTED Final   Proteus species NOT DETECTED NOT DETECTED Final   Salmonella species NOT DETECTED NOT DETECTED Final   Serratia marcescens NOT DETECTED NOT DETECTED Final   Haemophilus influenzae NOT DETECTED NOT DETECTED Final   Neisseria meningitidis NOT DETECTED NOT  DETECTED Final   Pseudomonas aeruginosa NOT DETECTED NOT DETECTED Final   Stenotrophomonas maltophilia NOT DETECTED NOT DETECTED Final   Candida albicans NOT DETECTED NOT DETECTED Final   Candida auris NOT DETECTED NOT DETECTED Final   Candida glabrata NOT DETECTED NOT DETECTED Final   Candida krusei NOT DETECTED NOT DETECTED Final   Candida parapsilosis NOT DETECTED NOT DETECTED Final   Candida tropicalis NOT DETECTED NOT DETECTED Final   Cryptococcus neoformans/gattii NOT DETECTED NOT DETECTED Final    Comment: Performed at [facility], [address], [place], [place] [address]  Resp Panel by RT-PCR (Flu A&B, Covid) Nasopharyngeal Swab     Status: None   Collection Time: 10/16/21  8:34 PM   Specimen: Nasopharyngeal Swab; Nasopharyngeal(NP) swabs in vial transport medium  Result Value  Ref Range Status   SARS Coronavirus 2 by RT PCR NEGATIVE NEGATIVE Final    Comment: (NOTE) SARS-CoV-2 target nucleic acids are NOT DETECTED.  The SARS-CoV-2 RNA is generally detectable in upper respiratory specimens during the acute phase of infection. The lowest concentration of SARS-CoV-2 viral copies this assay can detect is 138 copies/mL. A negative result does not preclude SARS-Cov-2 infection and should not be used as the sole basis for treatment or other patient management decisions. A negative result may occur with  improper specimen collection/handling, submission of specimen other than nasopharyngeal swab, presence of viral mutation(s) within the areas targeted by this assay, and inadequate number of viral copies(<138 copies/mL). A negative result must be combined with clinical observations, patient history, and epidemiological information. The expected result is Negative.  Fact Sheet for Patients:  EntrepreneurPulse.com.au  Fact Sheet for Healthcare Providers:  IncredibleEmployment.be  This test is no t yet approved or cleared by the [place] FDA and  has been authorized for detection and/or diagnosis of SARS-CoV-2 by FDA under an Emergency Use Authorization (EUA). This EUA will remain  in effect (meaning this test can be used) for the duration of the COVID-19 declaration under Section 564(b)(1) of the Act, 21 U.S.C.section 360bbb-3(b)(1), unless the authorization is terminated  or revoked sooner.       Influenza A by PCR NEGATIVE NEGATIVE Final   Influenza B by PCR NEGATIVE NEGATIVE Final    Comment: (NOTE) The Xpert Xpress SARS-CoV-2/FLU/RSV plus assay is intended as an aid in the diagnosis of influenza from Nasopharyngeal swab specimens and should not be used as a sole basis for treatment. Nasal washings and aspirates are unacceptable for Xpert Xpress SARS-CoV-2/FLU/RSV testing.  Fact Sheet for Patients: EntrepreneurPulse.com.au  Fact Sheet for Healthcare Providers: IncredibleEmployment.be  This test is not yet approved or cleared by the [place] FDA and has been authorized for detection and/or diagnosis of SARS-CoV-2 by FDA under an Emergency Use Authorization (EUA). This EUA will remain in effect (meaning this test can be used) for the duration of the COVID-19 declaration under Section 564(b)(1) of the Act, 21 U.S.C. section 360bbb-3(b)(1), unless the authorization is terminated or revoked.  Performed at [facility], [place] [address] [place], [place] [address]   MRSA Next Gen by PCR, Nasal     Status: None   Collection Time: 10/17/21  1:16 PM   Specimen: Nasal Mucosa; Nasal Swab  Result Value Ref Range Status   MRSA by PCR Next Gen NOT DETECTED NOT DETECTED Final    Comment: (NOTE) The GeneXpert MRSA Assay (FDA approved for NASAL specimens only), is one component of a comprehensive MRSA colonization surveillance program. It is not intended to diagnose MRSA infection nor to guide or monitor treatment for MRSA infections. Test performance is not FDA  approved in patients less than 79 years old. Performed at [facility], [place] [address], [place], [place] [address]    *Note: Due to a large number of results and/or encounters for the requested time period, some results have not been displayed. A complete set of results can be found in Results Review.    Coagulation Studies: Recent Labs    10/17/21 1403  LABPROT 16.7*  INR 1.4*    Urinalysis: Recent Labs    10/17/21 0623  COLORURINE YELLOW  LABSPEC 1.020  PHURINE 5.0  GLUCOSEU NEGATIVE  HGBUR LARGE*  BILIRUBINUR NEGATIVE  KETONESUR NEGATIVE  PROTEINUR NEGATIVE  NITRITE NEGATIVE  LEUKOCYTESUR NEGATIVE  Imaging: DG Chest 1 View  Result Date: 10/16/2021 CLINICAL DATA:  Weakness, known left rib fractures EXAM: CHEST  1 VIEW COMPARISON:  None. FINDINGS: Lungs are well expanded, symmetric, and clear. No pneumothorax or pleural effusion. Cardiac size within normal limits. Pulmonary vascularity is normal. Osseous structures are age-appropriate. Known left ninth and tenth rib fractures is not well visualized on this examination. Healed left clavicular mid-diaphyseal fracture noted. IMPRESSION: No active disease. Known left ninth and tenth rib fractures are not well visualized on this exam. Electronically Signed   By: Fidela Salisbury M.D.   On: 10/16/2021 19:52   CT Head Wo Contrast  Result Date: 10/16/2021 CLINICAL DATA:  Increasing weakness over the past week with subsequent fall, initial encounter EXAM: CT HEAD WITHOUT CONTRAST CT CERVICAL SPINE WITHOUT CONTRAST TECHNIQUE: Multidetector CT imaging of the head and cervical spine was performed following the standard protocol without intravenous contrast. Multiplanar CT image reconstructions of the cervical spine were also generated. COMPARISON:  02/13/2020 FINDINGS: CT HEAD FINDINGS Brain: No evidence of acute infarction, hemorrhage, hydrocephalus, extra-axial collection or mass lesion/mass effect. Vascular: No hyperdense  vessel or unexpected calcification. Skull: Normal. Negative for fracture or focal lesion. Sinuses/Orbits: No acute finding. Other: None. CT CERVICAL SPINE FINDINGS Alignment: Within normal limits. Skull base and vertebrae: 7 cervical segments are well visualized. Vertebral body height is well maintained. Disc space narrowing with osteophytic changes are noted at C5-6. Mild facet hypertrophic changes are noted. No acute fracture or acute facet abnormality is noted. Soft tissues and spinal canal: No prevertebral fluid or swelling. No visible canal hematoma. Upper chest: Visualized lung apices are within normal limits Other: None IMPRESSION: CT of the head: No acute intracranial abnormality noted. CT of the cervical spine: Mild degenerative changes without acute abnormality. Electronically Signed   By: Inez Catalina M.D.   On: 10/16/2021 19:58   CT Cervical Spine Wo Contrast  Result Date: 10/16/2021 CLINICAL DATA:  Increasing weakness over the past week with subsequent fall, initial encounter EXAM: CT HEAD WITHOUT CONTRAST CT CERVICAL SPINE WITHOUT CONTRAST TECHNIQUE: Multidetector CT imaging of the head and cervical spine was performed following the standard protocol without intravenous contrast. Multiplanar CT image reconstructions of the cervical spine were also generated. COMPARISON:  02/13/2020 FINDINGS: CT HEAD FINDINGS Brain: No evidence of acute infarction, hemorrhage, hydrocephalus, extra-axial collection or mass lesion/mass effect. Vascular: No hyperdense vessel or unexpected calcification. Skull: Normal. Negative for fracture or focal lesion. Sinuses/Orbits: No acute finding. Other: None. CT CERVICAL SPINE FINDINGS Alignment: Within normal limits. Skull base and vertebrae: 7 cervical segments are well visualized. Vertebral body height is well maintained. Disc space narrowing with osteophytic changes are noted at C5-6. Mild facet hypertrophic changes are noted. No acute fracture or acute facet abnormality  is noted. Soft tissues and spinal canal: No prevertebral fluid or swelling. No visible canal hematoma. Upper chest: Visualized lung apices are within normal limits Other: None IMPRESSION: CT of the head: No acute intracranial abnormality noted. CT of the cervical spine: Mild degenerative changes without acute abnormality. Electronically Signed   By: Inez Catalina M.D.   On: 10/16/2021 19:58   MR FOOT RIGHT WO CONTRAST  Result Date: 10/17/2021 CLINICAL DATA:  Diabetic foot ulcer EXAM: MRI OF THE RIGHT FOREFOOT WITHOUT CONTRAST TECHNIQUE: Multiplanar, multisequence MR imaging of the right forefoot was performed. No intravenous contrast was administered. COMPARISON:  X-ray 10/16/2021 FINDINGS: Bones/Joint/Cartilage Postsurgical changes of prior fifth ray resection. Extensive bone marrow edema with intermediate-low T1 signal changes at  the fourth metatarsal base and proximal diaphysis as well as throughout the adjacent cuboid, compatible with acute osteomyelitis. Fluid within the fourth TMT joint space suggests septic arthritis. Patchy marrow edema throughout the remaining bones of the midfoot including the cuneiform bones and lateral portion of the navicular bone. Similar patchy marrow edema involves the bases of the first, second, and third metatarsals. There are areas of intermediate T1 signal intensity within these bones which favor a component of early acute osteomyelitis. No acute fracture or dislocation. Ligaments Lisfranc ligament is poorly defined. Collateral ligaments of the MTP joints are intact. Muscles and Tendons Marked edematous changes of the visualized foot musculature likely reflecting a combination of myositis and denervation. No focal tenosynovial fluid collection. Soft tissues Soft tissue ulceration at the dorso lateral aspect of the forefoot overlying the level of the fourth metatarsal diaphysis. Additional deep soft tissue ulceration along the plantar aspect of the forefoot underlying the  level of the fourth metatarsal base with tract extending to the underlying bone. Two small fluid collections within the soft tissues near the level of the fourth TMT joint measuring up to 1.3 cm. Diffuse circumferential soft tissue edema. Numerous foci of susceptibility within the soft tissues in the region of the midfoot compatible with subcutaneous emphysema seen radiographically. IMPRESSION: 1. Acute osteomyelitis of the fourth metatarsal base and proximal diaphysis as well as throughout the adjacent cuboid. Fluid within the fourth TMT joint suggests septic arthritis. 2. Patchy marrow edema throughout the remaining bones of the midfoot as well as the metatarsal bases. Findings favor a component of early acute osteomyelitis. 3. Soft tissue ulcerations at the dorsolateral aspect of the forefoot overlying the level of the fourth metatarsal diaphysis. Additional deep soft tissue ulceration along the plantar aspect of the forefoot underlying the level of the fourth metatarsal base with tract extending to the underlying bone. Two small abscesses within the soft tissues near the level of the fourth TMT joint measuring up to 1.3 cm. 4. Numerous foci of susceptibility within the soft tissues in the region of the midfoot compatible with subcutaneous emphysema seen radiographically. 5. Marked edematous changes of the visualized foot musculature likely reflecting a combination of myositis and denervation. Electronically Signed   By: Davina Poke D.O.   On: 10/17/2021 08:20   DG Foot Complete Left  Result Date: 10/16/2021 CLINICAL DATA:  Bilateral foot wound EXAM: LEFT FOOT - COMPLETE 3+ VIEW COMPARISON:  None. FINDINGS: Posttraumatic or postsurgical change involving the left calcaneus is again noted with truncation of the posterior calcaneus. No acute fracture or dislocation. Large soft tissue ulcer is seen subjacent to the calcaneus, increased in size since prior examination. There is extensive soft tissue swelling  of the left hindfoot. There has developed subtle periosteal reaction and sclerosis involving the inferior aspect of the calcaneus which may reflect changes of a chronic osteomyelitis in this location. IMPRESSION: Enlarging, large soft tissue ulcer involving the plantar aspect of the left hindfoot with subjacent sclerosis and subtle periosteal reaction involving the calcaneus suspicious for changes of chronic osteomyelitis in this location. Extensive surrounding soft tissue swelling. Electronically Signed   By: Fidela Salisbury M.D.   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 There has developed a large soft tissue ulceration involving the lateral, plantar aspect of the right midfoot subjacent to the a  cuboid. No associated osseous erosion to suggest osteomyelitis. There is extensive soft tissue swelling of the right mid and forefoot. Subcutaneous edema noted within the right ankle. There are multiple foci of gas noted within the subcutaneous soft tissues of the right forefoot dorsally and midfoot medially, possibly related to changes of direct trauma or aggressive infection. No acute fracture or dislocation. Moderate plantar and superior calcaneal spurs are noted. IMPRESSION: Soft tissue ulcer involving the lateral, plantar right midfoot. Extensive soft tissue swelling of the right foot and ankle. Subcutaneous gas within the right forefoot and midfoot remote from the ulcer may reflect changes of direct trauma or aggressive infection. Electronically Signed   By: Fidela Salisbury M.D.   On: 10/16/2021 19:55   CT CHEST ABDOMEN PELVIS WO CONTRAST  Result Date: 10/16/2021 CLINICAL DATA:  Altered mental status, fall, found down, abdominal distension. Abdominal pain, acute, nonlocalized Abdominal trauma, blunt. EXAM: CT CHEST, ABDOMEN AND  PELVIS WITHOUT CONTRAST TECHNIQUE: Multidetector CT imaging of the chest, abdomen and pelvis was performed following the standard protocol without IV contrast. COMPARISON:  08/23/2021 FINDINGS: CT CHEST FINDINGS Cardiovascular: Extensive multi-vessel coronary artery calcification. Global cardiac size within normal limits. No pericardial effusion. Central pulmonary arteries are of normal caliber. Mild atherosclerotic calcification within the thoracic aorta. No aortic aneurysm. Mediastinum/Nodes: No enlarged mediastinal, hilar, or axillary lymph nodes. Thyroid gland, trachea, and esophagus demonstrate no significant findings. Lungs/Pleura: Mild bibasilar atelectasis. Minimal left basilar pleural thickening superficial to acute left posterior rib fractures. The lungs are otherwise clear. No pneumothorax or pleural effusion. Central airways are widely patent. Musculoskeletal: There are acute mildly displaced fractures of the left ninth and tenth ribs posteriorly. CT ABDOMEN PELVIS FINDINGS Hepatobiliary: The liver contour is nodular in keeping with changes of cirrhosis. 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 No definite solid intrahepatic mass identified. No intrahepatic biliary ductal dilation. There is mild extrahepatic biliary ductal dilation, progressive since prior examination, with the extrahepatic bile duct measuring up to 10 mm in diameter. Pancreas: Unremarkable Spleen: The spleen is mildly enlarged measuring 14.1 cm in greatest dimension, stable since prior examination. No intrasplenic masses are seen. Adrenals/Urinary Tract: Adrenal glands are unremarkable. Kidneys are normal, without renal calculi, focal lesion, or hydronephrosis. Bladder is unremarkable. Stomach/Bowel: Stomach is within normal limits. Appendix appears normal. No evidence of bowel wall thickening, distention, or inflammatory changes. Vascular/Lymphatic: Aortic  atherosclerosis. No enlarged abdominal or pelvic lymph nodes. Reproductive: Status post hysterectomy. No adnexal masses. Other: Tiny fat containing umbilical hernia.  No ascites. Musculoskeletal: L1 corpectomy and T11-L3 posterior lumbar fusion with instrumentation has been performed. No definite bridging callus or incorporation of interbody bone graft is identified along the fused segment. Additionally, there is periprosthetic lucency involving the pedicle screws of T11, L2, and L3 as well as fracture of the left pedicle screw of T11 and migration of the proximal aspects of the T11 pedicle screws into the T10-11 disc space. Together, the findings suggest motion along the fused segment. No acute fracture identified. IMPRESSION: Acute mildly displaced fractures of the left ninth and tenth ribs posteriorly. No pneumothorax. Extensive multi-vessel coronary artery calcification. Cirrhosis. 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 L1 corpectomy and T11-L3 posterior lumbar fusion procedure with findings suggesting motion along the fused segment as noted above. This may be better assessed with dedicated CT imaging, if indicated. Aortic Atherosclerosis (ICD10-I70.0). Electronically Signed   By: Fidela Salisbury M.D.   On: 10/16/2021 19:50  Medications:    ceFEPime (MAXIPIME) IV 1 g (10/18/21 0206)   DAPTOmycin (CUBICIN)  IV     heparin 1,550 Units/hr (10/18/21 0944)   lactated ringers 50 mL/hr at 10/17/21 2252   magnesium sulfate bolus IVPB     metronidazole 500 mg (10/18/21 0949)   sodium bicarbonate 150 mEq in D5W infusion 100 mL/hr at 10/17/21 2239    sodium chloride   Intravenous Once   sodium chloride   Intravenous Once   atorvastatin  10 mg Oral Daily   Chlorhexidine Gluconate Cloth  6 each Topical Daily   cholecalciferol  400 Units Oral Daily   ferrous sulfate  325 mg Oral BID WC   folic acid  2 mg Oral Daily   insulin aspart  0-6 Units Subcutaneous Q6H   insulin  glargine-yfgn  20 Units Subcutaneous Q2200   pantoprazole  40 mg Oral Daily   acetaminophen, HYDROmorphone (DILAUDID) injection, ondansetron **OR** ondansetron (ZOFRAN) IV, senna-docusate, traMADol  Assessment/ Plan:  Acute on chronic kidney injury.  Baseline creatinine about 3.5.  Creatinine seems to be slowly improving with IV hydration.  She also has a Foley catheter placed to monitor I's and O's.  It appears unclear whether she had some bladder outlet obstruction although no hydronephrosis was seen on CT scan.  We will continue to avoid nephrotoxins no ACE inhibitor's no ARB's no nonsteroidal anti-inflammatory drugs avoid IV contrast.  Renally adjust medications follow renal panel on daily basis monitor I's and O's ANEMIA-does not appear to be an issue at this point.  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

## 2021-10-18 NOTE — Progress Notes (Signed)
[facility] for Infectious Disease  Date of Admission:  10/16/2021     Total days of antibiotics 3         ASSESSMENT:  [name] [name]'s blood culture is positive for Streptococcus intermedius. Improved today and leukocytosis is down to 16 with improvement in her creatinine. [name] and husband are currently declining surgical intervention. Discussed that antibiotics alone are unlikely to resolve this infection given the burden of infection that is present and which ultimately may be life threatening. Will continue daptomcyin and metronidazole and change cefepime to ceftriaxone.  Recheck blood cultures and check TTE to evaluate for endocarditis. May need to consider CT abdomen/pelvis to check for abscesses given Streptococcus intermedius. Will require a prolonged course of treatment. Remaining medical and supportive care per primary team.   PLAN:  Continue Daptomycin and metronidazole.  Change cefepime to ceftriaxone.  Repeat blood culture.  TTE to evaluate for endocarditis.  Remaining medical and supportive care per primary team.   Principal Problem:   Osteomyelitis of left foot (HCC) Active Problems:   Essential hypertension   Cellulitis of right foot   Hyponatremia   Hyperkalemia   Acute kidney injury superimposed on CKD (HCC)   Leukocytosis   Diabetic foot ulcers (HCC)   DM (diabetes mellitus), type 2 with renal complications (HCC)   Generalized weakness    sodium chloride   Intravenous Once   Chlorhexidine Gluconate Cloth  6 each Topical Daily   cholecalciferol  400 Units Oral Daily   ferrous sulfate  325 mg Oral BID WC   folic acid  2 mg Oral Daily   insulin aspart  0-6 Units Subcutaneous Q6H   insulin glargine-yfgn  20 Units Subcutaneous Q2200   pantoprazole  40 mg Oral Daily    SUBJECTIVE:  Afebrile overnight with no acute events. Leukocytosis improved. Blood cultures growing Streptococcus species. No no concerns/complaints.   Allergies  Allergen  Reactions   Bee Pollen Anaphylaxis   Fish-Derived Products Hives, Shortness Of Breath, Swelling and Rash    Hives get in throat causing trouble breathing   Mushroom Extract Complex Anaphylaxis   Penicillins Anaphylaxis    **Tolerated cefepime March 2021 Did it involve swelling of the face/tongue/throat, SOB, or low BP? Yes Did it involve sudden or severe rash/hives, skin peeling, or any reaction on the inside of your mouth or nose? No Did you need to seek medical attention at a hospital or doctor's office? Yes When did it last happen?  A few months ago If all above answers are "NO", may proceed with cephalosporin use.   Rosemary Oil Anaphylaxis   Shellfish Allergy Hives, Shortness Of Breath, Swelling and Rash   Tomato Hives and Shortness Of Breath    Hives in throat causes her trouble breathing   Acetaminophen Other (See Comments)    GI upset   Aloe Vera Hives   Broccoli [Brassica Oleracea] Hives   Acyclovir And Related Other (See Comments)    Unknown reaction   Naproxen Other (See Comments)    Unknown reaction     Review of Systems: Review of Systems  Constitutional:  Negative for chills, fever and weight loss.  Respiratory:  Negative for cough, shortness of breath and wheezing.   Cardiovascular:  Negative for chest pain and leg swelling.  Gastrointestinal:  Negative for abdominal pain, constipation, diarrhea, nausea and vomiting.  Skin:  Negative for rash.     OBJECTIVE: Vitals:   10/18/21 0500 10/18/21 0806 10/18/21 1204 10/18/21 1238  BP:  [name] Kitchen)  133/55 (!) 121/49 (!) 127/55  Pulse:   77 74  Resp:   14 16  Temp:  98.4 F (36.9 C) 98.6 F (37 C) 98.9 F (37.2 C)  TempSrc:  Oral Oral Oral  SpO2:   96% 94%  Weight: 75.4 kg     Height:       Body mass index is 27.66 kg/m.  Physical Exam Constitutional:      General: She is not in acute distress.    Appearance: She is well-developed.     Comments: Lying in bed with head of bed elevated.   Cardiovascular:      Rate and Rhythm: Normal rate and regular rhythm.     Heart sounds: Normal heart sounds.  Pulmonary:     Effort: Pulmonary effort is normal.     Breath sounds: Normal breath sounds.  Skin:    General: Skin is warm and dry.  Neurological:     Mental Status: She is alert.    Lab Results Lab Results  Component Value Date   WBC 16.1 (H) 10/18/2021   HGB 7.1 (L) 10/18/2021   HCT 21.3 (L) 10/18/2021   MCV 78.3 (L) 10/18/2021   PLT 181 10/18/2021    Lab Results  Component Value Date   CREATININE 5.92 (H) 10/18/2021   BUN 121 (H) 10/18/2021   NA 125 (L) 10/18/2021   K 4.8 10/18/2021   CL 92 (L) 10/18/2021   CO2 23 10/18/2021    Lab Results  Component Value Date   ALT 26 10/18/2021   AST 48 (H) 10/18/2021   ALKPHOS 99 10/18/2021   BILITOT 0.9 10/18/2021     Microbiology: Recent Results (from the past 240 hour(s))  Culture, blood (Routine X 2) w Reflex to ID Panel     Status: Abnormal (Preliminary result)   Collection Time: 10/16/21  7:56 PM   Specimen: BLOOD  Result Value Ref Range Status   Specimen Description BLOOD RIGHT ANTECUBITAL  Final   Special Requests   Final    BOTTLES DRAWN AEROBIC AND ANAEROBIC Blood Culture adequate volume   Culture  Setup Time   Final    GRAM POSITIVE COCCI IN CHAINS ANAEROBIC BOTTLE ONLY Organism ID to follow CRITICAL RESULT CALLED TO, READ BACK BY AND VERIFIED WITH: Salome Holmes [name] 2106 10/17/21 A BROWNING IN BOTH AEROBIC AND ANAEROBIC BOTTLES    Culture (A)  Final    STREPTOCOCCUS INTERMEDIUS CULTURE REINCUBATED FOR BETTER GROWTH Performed at [facility], [place] [address], [place], [place] [address]    Report Status PENDING  Incomplete  Blood Culture ID Panel (Reflexed)     Status: Abnormal   Collection Time: 10/16/21  7:56 PM  Result Value Ref Range Status   Enterococcus faecalis NOT DETECTED NOT DETECTED Final   Enterococcus Faecium NOT DETECTED NOT DETECTED Final   Listeria monocytogenes NOT DETECTED NOT DETECTED Final    Staphylococcus species NOT DETECTED NOT DETECTED Final   Staphylococcus aureus (BCID) NOT DETECTED NOT DETECTED Final   Staphylococcus epidermidis NOT DETECTED NOT DETECTED Final   Staphylococcus lugdunensis NOT DETECTED NOT DETECTED Final   Streptococcus species DETECTED (A) NOT DETECTED Final    Comment: Not Enterococcus species, Streptococcus agalactiae, Streptococcus pyogenes, or Streptococcus pneumoniae. CRITICAL RESULT CALLED TO, READ BACK BY AND VERIFIED WITH: Salome Holmes [facility] 2106 10/17/21 A BROWNING    Streptococcus agalactiae NOT DETECTED NOT DETECTED Final   Streptococcus pneumoniae NOT DETECTED NOT DETECTED Final   Streptococcus pyogenes NOT DETECTED NOT  DETECTED Final   A.calcoaceticus-baumannii NOT DETECTED NOT DETECTED Final   Bacteroides fragilis NOT DETECTED NOT DETECTED Final   Enterobacterales NOT DETECTED NOT DETECTED Final   Enterobacter cloacae complex NOT DETECTED NOT DETECTED Final   Escherichia coli NOT DETECTED NOT DETECTED Final   Klebsiella aerogenes NOT DETECTED NOT DETECTED Final   Klebsiella oxytoca NOT DETECTED NOT DETECTED Final   Klebsiella pneumoniae NOT DETECTED NOT DETECTED Final   Proteus species NOT DETECTED NOT DETECTED Final   Salmonella species NOT DETECTED NOT DETECTED Final   Serratia marcescens NOT DETECTED NOT DETECTED Final   Haemophilus influenzae NOT DETECTED NOT DETECTED Final   Neisseria meningitidis NOT DETECTED NOT DETECTED Final   Pseudomonas aeruginosa NOT DETECTED NOT DETECTED Final   Stenotrophomonas maltophilia NOT DETECTED NOT DETECTED Final   Candida albicans NOT DETECTED NOT DETECTED Final   Candida auris NOT DETECTED NOT DETECTED Final   Candida glabrata NOT DETECTED NOT DETECTED Final   Candida krusei NOT DETECTED NOT DETECTED Final   Candida parapsilosis NOT DETECTED NOT DETECTED Final   Candida tropicalis NOT DETECTED NOT DETECTED Final   Cryptococcus neoformans/gattii NOT DETECTED NOT DETECTED Final    Comment:  Performed at [facility], [address], [place], [place] [address]  Resp Panel by RT-PCR (Flu A&B, Covid) Nasopharyngeal Swab     Status: None   Collection Time: 10/16/21  8:34 PM   Specimen: Nasopharyngeal Swab; Nasopharyngeal(NP) swabs in vial transport medium  Result Value Ref Range Status   SARS Coronavirus 2 by RT PCR NEGATIVE NEGATIVE Final    Comment: (NOTE) SARS-CoV-2 target nucleic acids are NOT DETECTED.  The SARS-CoV-2 RNA is generally detectable in upper respiratory specimens during the acute phase of infection. The lowest concentration of SARS-CoV-2 viral copies this assay can detect is 138 copies/mL. A negative result does not preclude SARS-Cov-2 infection and should not be used as the sole basis for treatment or other patient management decisions. A negative result may occur with  improper specimen collection/handling, submission of specimen other than nasopharyngeal swab, presence of viral mutation(s) within the areas targeted by this assay, and inadequate number of viral copies(<138 copies/mL). A negative result must be combined with clinical observations, patient history, and epidemiological information. The expected result is Negative.  Fact Sheet for Patients:  EntrepreneurPulse.com.au  Fact Sheet for Healthcare Providers:  IncredibleEmployment.be  This test is no t yet approved or cleared by the [place] FDA and  has been authorized for detection and/or diagnosis of SARS-CoV-2 by FDA under an Emergency Use Authorization (EUA). This EUA will remain  in effect (meaning this test can be used) for the duration of the COVID-19 declaration under Section 564(b)(1) of the Act, 21 U.S.C.section 360bbb-3(b)(1), unless the authorization is terminated  or revoked sooner.       Influenza A by PCR NEGATIVE NEGATIVE Final   Influenza B by PCR NEGATIVE NEGATIVE Final    Comment: (NOTE) The Xpert Xpress SARS-CoV-2/FLU/RSV  plus assay is intended as an aid in the diagnosis of influenza from Nasopharyngeal swab specimens and should not be used as a sole basis for treatment. Nasal washings and aspirates are unacceptable for Xpert Xpress SARS-CoV-2/FLU/RSV testing.  Fact Sheet for Patients: EntrepreneurPulse.com.au  Fact Sheet for Healthcare Providers: IncredibleEmployment.be  This test is not yet approved or cleared by the [place] FDA and has been authorized for detection and/or diagnosis of SARS-CoV-2 by FDA under an Emergency Use Authorization (EUA). This EUA will remain in effect (meaning this  test can be used) for the duration of the COVID-19 declaration under Section 564(b)(1) of the Act, 21 U.S.C. section 360bbb-3(b)(1), unless the authorization is terminated or revoked.  Performed at [facility], [place] [address], [place], [place] [address]   MRSA Next Gen by PCR, Nasal     Status: None   Collection Time: 10/17/21  1:16 PM   Specimen: Nasal Mucosa; Nasal Swab  Result Value Ref Range Status   MRSA by PCR Next Gen NOT DETECTED NOT DETECTED Final    Comment: (NOTE) The GeneXpert MRSA Assay (FDA approved for NASAL specimens only), is one component of a comprehensive MRSA colonization surveillance program. It is not intended to diagnose MRSA infection nor to guide or monitor treatment for MRSA infections. Test performance is not FDA approved in patients less than 75 years old. Performed at [facility], [place] [address] [place], [place] [address]      Terri Piedra, [facility] for Infectious Disease [facility]  10/18/2021  1:49 PM

## 2021-10-18 NOTE — Progress Notes (Signed)
[facility] for Heparin Indication: hx of DVT  Allergies  Allergen Reactions   Bee Pollen Anaphylaxis   Fish-Derived Products Hives, Shortness Of Breath, Swelling and Rash    Hives get in throat causing trouble breathing   Mushroom Extract Complex Anaphylaxis   Penicillins Anaphylaxis    **Tolerated cefepime March 2021 Did it involve swelling of the face/tongue/throat, SOB, or low BP? Yes Did it involve sudden or severe rash/hives, skin peeling, or any reaction on the inside of your mouth or nose? No Did you need to seek medical attention at a hospital or doctor's office? Yes When did it last happen?  A few months ago If all above answers are "NO", may proceed with cephalosporin use.   Rosemary Oil Anaphylaxis   Shellfish Allergy Hives, Shortness Of Breath, Swelling and Rash   Tomato Hives and Shortness Of Breath    Hives in throat causes her trouble breathing   Acetaminophen Other (See Comments)    GI upset   Aloe Vera Hives   Broccoli [Brassica Oleracea] Hives   Acyclovir And Related Other (See Comments)    Unknown reaction   Naproxen Other (See Comments)    Unknown reaction    Patient Measurements: Height: 5' 5"  (165.1 cm) Weight: 75.4 kg (166 lb 3.6 oz) IBW/kg (Calculated) : 57  Heparin Dosing Weight: 72.6 kg  Vital Signs: Temp: 98.4 F (36.9 C) (12/20 0806) Temp Source: Oral (12/20 0806) BP: 133/55 (12/20 0806) Pulse Rate: 81 (12/20 0445)  Labs: Recent Labs    10/16/21 1956 10/17/21 0617 10/17/21 1403 10/17/21 1403 10/17/21 1804 10/17/21 2318 10/18/21 0719  HGB 8.2*  --   --   --   --   --  7.1*  HCT 25.6*  --   --   --   --   --  21.3*  PLT 215  --   --   --   --   --  181  APTT  --   --  31  --  43*  --   --   LABPROT  --   --  16.7*  --   --   --   --   INR  --   --  1.4*  --   --   --   --   HEPARINUNFRC  --   --  <0.10*   < > <0.10* <0.10* 0.11*  CREATININE 8.24* 7.05*  --   --  6.55*  --  5.92*  CKTOTAL  --   697*  --   --   --   --   --    < > = values in this interval not displayed.    Estimated Creatinine Clearance (by C-G formula based on SCr of 5.92 mg/dL (H)) Female: 9.6 mL/min (A) Female: 11.8 mL/min (A)  Assessment: 65 y.o. adult with a PMH significant for CKD 4 and hx of DVTs who presented with AMS. Heparin per pharmacy consult placed for hx of DVT.   Unsure if patient is taking Xarelto PTA. Xarelto 2.5 mg tablets previously on medication list, but patient is not sure if she is taking or not. I also see 15 mg dosing in Care Everywhere in 2021 with nothing more recent. aPTT and HL both subtherapeutic and appears to suggest she was not previously taking Xarelto PTA.   CBC trending down, hgb decreased from 8.2 > 7.1, plts 215 > 181. No s/sx bleeding noted. Heparin level remains subtherapeutic at 0.11. Will  rebolus and increase rate of infusion.   Goal of Therapy:  Heparin level 0.3-0.7 units/ml Monitor platelets by anticoagulation protocol: Yes   Plan:  Give heparin IV 2000 units bolus x1 Increase heparin infusion to 1750 units/hr Check heparin level in 8 hours and daily while on heparin Continue to monitor H&H and platelets    Thank you for allowing pharmacy to be a part of this patients care.  Ardyth Harps, PharmD Clinical Pharmacist

## 2021-10-18 NOTE — Progress Notes (Signed)
[name], adult   DOB: 12-11-1955, 65 y.o.   MRN: [id number] I called the patient's husband this morning and had a discussion regarding her medical conditions.  With the purulent abscess of the right foot and chronic osteomyelitis of the left foot I discussed with him the recommendation to proceed with bilateral below-knee amputations.  Patient's husband states that in no way would he agree to the amputation surgery.  I discussed that patient could die from these infections. He states he understands and will not consider amputation surgery.  I will cancel her surgery for tomorrow.  I reinforced that this is AGAINST MEDICAL ADVICE.

## 2021-10-18 NOTE — Plan of Care (Signed)
°  Problem: Coping: °Goal: Level of anxiety will decrease °Outcome: Progressing °  °

## 2021-10-18 NOTE — Progress Notes (Signed)
Patient ID: [name], adult   DOB: 09/29/1956, 65 y.o.   MRN: [id number] Patient's vital signs have improved she is still not very alert.  I discussed with her the recommendation to proceed with bilateral transtibial amputations.  I will call the patient's husband to review this with him as well.  Plan for surgery tomorrow.  I have ordered 1 unit of packed red blood cells to be received preoperatively.  Hemoglobin was 8.2 on admission.

## 2021-10-19 ENCOUNTER — Encounter (HOSPITAL_COMMUNITY)
Admission: EM | Disposition: A | Discharge status: Home Health | Payer: Self-pay | Source: Home / Self Care | Attending: Internal Medicine

## 2021-10-19 ENCOUNTER — Ambulatory Visit: Admit: 2021-10-19 | Discharge: 2021-10-19 | Payer: MEDICARE

## 2021-10-19 ENCOUNTER — Encounter: Admit: 2021-10-19 | Discharge: 2021-10-19 | Payer: MEDICARE

## 2021-10-19 DIAGNOSIS — K2970 Gastritis, unspecified, without bleeding: Secondary | ICD-10-CM

## 2021-10-19 DIAGNOSIS — G5600 Carpal tunnel syndrome, unspecified upper limb: Secondary | ICD-10-CM

## 2021-10-19 DIAGNOSIS — E119 Type 2 diabetes mellitus without complications: Secondary | ICD-10-CM

## 2021-10-19 DIAGNOSIS — N184 Chronic kidney disease, stage 4 (severe): Secondary | ICD-10-CM

## 2021-10-19 DIAGNOSIS — J309 Allergic rhinitis, unspecified: Secondary | ICD-10-CM

## 2021-10-19 DIAGNOSIS — K5900 Constipation, unspecified: Secondary | ICD-10-CM

## 2021-10-19 DIAGNOSIS — E785 Hyperlipidemia, unspecified: Secondary | ICD-10-CM

## 2021-10-19 DIAGNOSIS — D649 Anemia, unspecified: Secondary | ICD-10-CM

## 2021-10-19 DIAGNOSIS — K589 Irritable bowel syndrome without diarrhea: Secondary | ICD-10-CM

## 2021-10-19 DIAGNOSIS — M79642 Pain in left hand: Secondary | ICD-10-CM

## 2021-10-19 DIAGNOSIS — J45909 Unspecified asthma, uncomplicated: Secondary | ICD-10-CM

## 2021-10-19 DIAGNOSIS — G9050 Complex regional pain syndrome I, unspecified: Secondary | ICD-10-CM

## 2021-10-19 DIAGNOSIS — E559 Vitamin D deficiency, unspecified: Secondary | ICD-10-CM

## 2021-10-19 DIAGNOSIS — I10 Essential (primary) hypertension: Secondary | ICD-10-CM

## 2021-10-19 LAB — COMPREHENSIVE METABOLIC PANEL
ALBUMIN: 4.4 g/dL (ref 3.5–5.0)
ALK PHOSPHATASE: 93 U/L (ref 25–110)
ALT: 23 U/L (ref 0–44)
ALT: 15 U/L (ref 7–56)
ANION GAP: 10 K/UL (ref 3–12)
AST: 35 U/L (ref 15–41)
AST: 18 U/L (ref 7–40)
Albumin: 1.6 g/dL — ABNORMAL LOW (ref 3.5–5.0)
Alkaline Phosphatase: 101 U/L (ref 38–126)
Anion gap: 10 (ref 5–15)
BLD UREA NITROGEN: 24 mg/dL (ref 7–25)
BUN: 112 mg/dL — ABNORMAL HIGH (ref 8–23)
CALCIUM: 9.7 mg/dL (ref 8.5–10.6)
CHLORIDE: 107 MMOL/L (ref 98–110)
CO2: 23 mmol/L (ref 22–32)
CO2: 23 MMOL/L (ref 21–30)
CREATININE: 1.5 mg/dL — ABNORMAL HIGH (ref 0.4–1.00)
Calcium: 7.2 mg/dL — ABNORMAL LOW (ref 8.9–10.3)
Chloride: 93 mmol/L — ABNORMAL LOW (ref 98–111)
Creatinine, Ser: 5.22 mg/dL — ABNORMAL HIGH (ref 0.44–1.00)
EGFR: 37 mL/min — ABNORMAL LOW (ref >60)
GFR, Estimated: 9 mL/min — ABNORMAL LOW (ref >60)
GLUCOSE,PANEL: 217 mg/dL — ABNORMAL HIGH (ref 70–100)
Glucose, Bld: 90 mg/dL (ref 70–99)
POTASSIUM: 4.3 MMOL/L (ref 3.5–5.1)
Potassium: 4.5 mmol/L (ref 3.5–5.1)
SODIUM: 140 MMOL/L (ref 137–147)
Sodium: 126 mmol/L — ABNORMAL LOW (ref 135–145)
TOTAL BILIRUBIN: 0.4 mg/dL (ref 0.3–1.2)
TOTAL PROTEIN: 7.2 g/dL — ABNORMAL LOW (ref 6.0–8.0)
Total Bilirubin: 0.9 mg/dL (ref 0.3–1.2)
Total Protein: 6 g/dL — ABNORMAL LOW (ref 6.5–8.1)

## 2021-10-19 LAB — CBC AND DIFF
HEMATOCRIT: 33 % — ABNORMAL LOW (ref 36–45)
HEMOGLOBIN: 11 g/dL — ABNORMAL LOW (ref 12.0–15.0)
RBC COUNT: 4 M/UL (ref 4.0–5.0)
WBC COUNT: 7.7 K/UL (ref 4.5–11.0)

## 2021-10-19 LAB — KAPPA/LAMBDA FREE LIGHT CHAINS
KAPPA FLC: 3.3 mg/dL — ABNORMAL HIGH (ref 0.33–1.94)
KAPPA/LAMBDA FLC: 1.3 (ref 0.26–1.65)
LAMBDA FLC: 2.3 mg/dL (ref 0.57–2.63)

## 2021-10-19 LAB — HEPATITIS B SURFACE AG

## 2021-10-19 LAB — URINALYSIS, MICROSCOPIC

## 2021-10-19 LAB — PROTEIN/CR RATIO,UR RAN
PROT CREAT RAT/CAL: 0.2 — ABNORMAL HIGH (ref <0.15)
UR CREATININE, RAN: 72 mg/dL
UR TOTAL PROTEIN,RAN: 14 mg/dL

## 2021-10-19 LAB — HEPATITIS C ANTIBODY W REFLEX HCV PCR QUANT

## 2021-10-19 LAB — CBC WITH DIFFERENTIAL/PLATELET
Abs Immature Granulocytes: 0.15 10*3/uL — ABNORMAL HIGH (ref 0.00–0.07)
Basophils Absolute: 0 10*3/uL (ref 0.0–0.1)
Basophils Relative: 0 %
Eosinophils Absolute: 0.1 10*3/uL (ref 0.0–0.5)
Eosinophils Relative: 1 %
HCT: 23.8 % — ABNORMAL LOW (ref 36.0–46.0)
Hemoglobin: 7.8 g/dL — ABNORMAL LOW (ref 12.0–15.0)
Immature Granulocytes: 1 %
Lymphocytes Relative: 8 %
Lymphs Abs: 1.1 10*3/uL (ref 0.7–4.0)
MCH: 26.5 pg (ref 26.0–34.0)
MCHC: 32.8 g/dL (ref 30.0–36.0)
MCV: 81 fL (ref 80.0–100.0)
Monocytes Absolute: 1.4 10*3/uL — ABNORMAL HIGH (ref 0.1–1.0)
Monocytes Relative: 10 %
Neutro Abs: 11.7 10*3/uL — ABNORMAL HIGH (ref 1.7–7.7)
Neutrophils Relative %: 80 %
Platelets: 161 10*3/uL (ref 150–400)
RBC: 2.94 MIL/uL — ABNORMAL LOW (ref 3.87–5.11)
RDW: 17.6 % — ABNORMAL HIGH (ref 11.5–15.5)
WBC: 14.5 10*3/uL — ABNORMAL HIGH (ref 4.0–10.5)
nRBC: 0 % (ref 0.0–0.2)

## 2021-10-19 LAB — C-REACTIVE PROTEIN: CRP: 17.6 mg/dL — ABNORMAL HIGH (ref <1.0)

## 2021-10-19 LAB — HEPARIN LEVEL (UNFRACTIONATED): Heparin Unfractionated: 0.17 IU/mL — ABNORMAL LOW (ref 0.30–0.70)

## 2021-10-19 LAB — GLUCOSE, CAPILLARY
Glucose-Capillary: 71 mg/dL (ref 70–99)
Glucose-Capillary: 80 mg/dL (ref 70–99)
Glucose-Capillary: 98 mg/dL (ref 70–99)

## 2021-10-19 LAB — MAGNESIUM: Magnesium: 1.9 mg/dL (ref 1.7–2.4)

## 2021-10-19 LAB — BRAIN NATRIURETIC PEPTIDE: B Natriuretic Peptide: 410.7 pg/mL — ABNORMAL HIGH (ref 0.0–100.0)

## 2021-10-19 SURGERY — AMPUTATION BELOW KNEE
Anesthesia: Choice | Site: Knee | Laterality: Bilateral

## 2021-10-19 MED ORDER — CARVEDILOL 12.5 MG PO TAB
12.5 mg | ORAL_TABLET | Freq: Two times a day (BID) | ORAL | 3 refills | 90.00000 days | Status: AC
Start: 2021-10-19 — End: ?

## 2021-10-19 MED ORDER — METRONIDAZOLE 500 MG PO TABS
500.0000 mg | ORAL_TABLET | Freq: Two times a day (BID) | ORAL | Status: DC
  Administered 2021-10-19 – 2021-10-22 (×6): 500 mg via ORAL
  Filled 2021-10-19 (×6): qty 1

## 2021-10-19 MED ORDER — DOXYCYCLINE HYCLATE 100 MG PO TABS
100.0000 mg | ORAL_TABLET | Freq: Two times a day (BID) | ORAL | Status: DC
  Administered 2021-10-19 – 2021-10-22 (×7): 100 mg via ORAL
  Filled 2021-10-19 (×7): qty 1

## 2021-10-19 MED ORDER — APIXABAN 5 MG PO TABS
5.0000 mg | ORAL_TABLET | Freq: Two times a day (BID) | ORAL | Status: DC
  Administered 2021-10-19 – 2021-10-22 (×7): 5 mg via ORAL
  Filled 2021-10-19 (×8): qty 1

## 2021-10-19 MED ORDER — TRAMADOL HCL 50 MG PO TABS
50.0000 mg | ORAL_TABLET | Freq: Four times a day (QID) | ORAL | Status: DC | PRN
  Administered 2021-10-19 – 2021-10-22 (×9): 50 mg via ORAL
  Filled 2021-10-19 (×10): qty 1

## 2021-10-19 MED ORDER — CARVEDILOL 6.25 MG PO TABS
6.2500 mg | ORAL_TABLET | Freq: Two times a day (BID) | ORAL | Status: DC
  Administered 2021-10-19 – 2021-10-22 (×6): 6.25 mg via ORAL
  Filled 2021-10-19 (×6): qty 1

## 2021-10-19 MED ORDER — INSULIN GLARGINE-YFGN 100 UNIT/ML ~~LOC~~ SOLN
15.0000 [IU] | Freq: Every day | SUBCUTANEOUS | Status: DC
  Administered 2021-10-19: 21:00:00 15 [IU] via SUBCUTANEOUS
  Filled 2021-10-19 (×2): qty 0.15

## 2021-10-19 MED ORDER — LACTATED RINGERS IV SOLN: INTRAVENOUS | Status: DC

## 2021-10-19 MED ORDER — SODIUM CHLORIDE 0.9 % IV SOLN
2.0000 g | INTRAVENOUS | Status: DC
  Administered 2021-10-20 – 2021-10-22 (×3): 2 g via INTRAVENOUS
  Filled 2021-10-19 (×3): qty 20

## 2021-10-19 MED ORDER — HYDROMORPHONE HCL 1 MG/ML IJ SOLN
0.5000 mg | Freq: Three times a day (TID) | INTRAMUSCULAR | Status: DC | PRN
  Administered 2021-10-19 – 2021-10-21 (×4): 0.5 mg via INTRAVENOUS
  Filled 2021-10-19 (×4): qty 0.5

## 2021-10-19 NOTE — Progress Notes (Signed)
PROGRESS NOTE                                                                                                                                                                                                             Patient Demographics:    [name] [name], is a 65 y.o. adult, DOB - Oct 08, 1956, VZC:[id number]  Outpatient Primary MD for the patient is Carollee Herter, Alferd Apa, DO    LOS - 3  Admit date - 10/16/2021    Chief Complaint  Patient presents with   Altered Mental Status       Brief Narrative (HPI from H&P)    [name] [name] is a 65 y.o. adult with medical history significant for CKD 4, DMT2, CAD, HTN, fibromyalgia, neuropathy of legs, chronic ulcers of feet, history of DVTs, history of renal transplant, history of cirrhosis who presents for generalized weakness that is progressively worsened gradually over the last 2 weeks prior to hospitalization in the ER she was diagnosed with severe sepsis due to bilateral foot cellulitis, AKI on CKD 4, severe dehydration with hyponatremia.  She was admitted for further care   Subjective:   Patient in bed, appears comfortable, denies any headache, no fever, no chest pain or pressure, no shortness of breath , no abdominal pain. No new focal weakness.  Does have bilateral foot pain right more than left.   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  Had detailed discussion with patient's husband on 10/18/2021 and patient on 10/19/2021 they are  still holding off on surgery despite being adequately warmed, completely understand the risk of sepsis, death and greater then expected limb loss, case discussed with ID on 10/20/2019 today we will readdress the antibiotics course and duration.  2.  AKI on CKD/renal transplant with severe dehydration and hyponatremia.  Hydrate, Foley, renal on board.  Defer management of this issue to nephrology.  3.  Dyslipidemia.  Replaced on statin.  4.  History of DVTs was on Xarelto.  To compromise renal function will place on Eliquis right now, since no surgery is planned stop heparin drip.  5.  Anemia of chronic disease worse with heme dilution due to IV fluids.  After consent 1 unit of packed RBC placement on 10/18/2021, placed on ferrous sulfate and folic acid and monitor.    6. HTN - Coreg.  7. DM type II.  On Lantus and sliding scale will monitor and adjust   Lab Results  Component Value Date   HGBA1C 7.1 (H) 10/17/2021   CBG (last 3)  Recent Labs    10/18/21 1854 10/19/21 0016 10/19/21 0556  GLUCAP 120* 98 80         Condition - Extremely Guarded  Family Communication  : Husband [name] [phone] called 10/17/2021, 10/18/2021  Code Status :  Full  Consults  : Orthopedics, nephrology, ID  PUD Prophylaxis : PPI   Procedures  :     TTE - 1. Left ventricular ejection fraction, by estimation, is 55 to 60%. The left ventricle has normal function. The left ventricle has no regional wall motion abnormalities. There is mild concentric left ventricular hypertrophy. Left ventricular diastolic parameters were normal.  2. Right ventricular systolic function is normal. The right ventricular size is mildly enlarged. There is moderately elevated pulmonary artery systolic pressure. The estimated right ventricular systolic pressure is 35.4 mmHg.  3. Left atrial size was mildly dilated.  4. The mitral valve is normal in structure. Trivial mitral valve regurgitation.  5. The aortic valve is  grossly normal. Aortic valve regurgitation is not visualized. No aortic stenosis is present.  6. The inferior vena cava is dilated in size with <50% respiratory variability, suggesting right atrial pressure of 15 mmHg. Comparison(s): Changes from prior study are noted. RV slightly enlarged compared to prior study. Conclusion(s)/Recommendation(s): No evidence of valvular vegetations on this transthoracic echocardiogram. Consider a transesophageal echocardiogram to exclude infective endocarditis if clinically indicated.   MRI - 1. Acute osteomyelitis of the fourth metatarsal base and proximal diaphysis as well as throughout the adjacent cuboid. Fluid within the fourth TMT joint suggests septic arthritis. 2. Patchy marrow edema throughout the remaining bones of the midfoot as well as the metatarsal bases. Findings favor a component of early acute osteomyelitis. 3. Soft tissue ulcerations at the dorsolateral aspect of the forefoot overlying the level of the fourth metatarsal diaphysis. Additional deep soft tissue ulceration along the plantar aspect of the forefoot underlying the level of the fourth metatarsal base with tract extending to the underlying bone. Two small abscesses within the soft tissues near the level of the fourth TMT joint measuring up to 1.3 cm. 4. Numerous foci of susceptibility within the soft tissues in the region of the midfoot compatible with subcutaneous emphysema seen radiographically. 5. Marked edematous changes of the visualized foot musculature likely reflecting a combination of myositis and denervation.      Disposition Plan  :    Status is: Inpatient  Remains inpatient appropriate because: Sepsis  DVT Prophylaxis  :        Lab Results  Component Value Date   PLT 161 10/19/2021    Diet :  Diet Order             DIET SOFT Room service appropriate? Yes; Fluid consistency: Thin  Diet effective now  Inpatient Medications  Scheduled Meds:   Chlorhexidine Gluconate Cloth  6 each Topical Daily   cholecalciferol  400 Units Oral Daily   ferrous sulfate  325 mg Oral BID WC   folic acid  2 mg Oral Daily   insulin aspart  0-6 Units Subcutaneous Q6H   insulin glargine-yfgn  20 Units Subcutaneous Q2200   pantoprazole  40 mg Oral Daily   Continuous Infusions:  sodium chloride 125 mL/hr at 10/18/21 1641   ceFEPime (MAXIPIME) IV 1 g (10/19/21 0202)   DAPTOmycin (CUBICIN)  IV     heparin 1,750 Units/hr (10/18/21 2112)   metronidazole 500 mg (10/19/21 0833)   PRN Meds:.acetaminophen, ondansetron **OR** ondansetron (ZOFRAN) IV, senna-docusate, traMADol  Antibiotics  :    Anti-infectives (From admission, onward)    Start     Dose/Rate Route Frequency Ordered Stop   10/18/21 2000  DAPTOmycin (CUBICIN) 600 mg in sodium chloride 0.9 % IVPB        8 mg/kg  75.8 kg 124 mL/hr over 30 Minutes Intravenous Every 48 hours 10/16/21 2245     10/18/21 1030  metroNIDAZOLE (FLAGYL) IVPB 500 mg        500 mg 100 mL/hr over 60 Minutes Intravenous Every 12 hours 10/18/21 0941     10/16/21 2330  DAPTOmycin (CUBICIN) 600 mg in sodium chloride 0.9 % IVPB        8 mg/kg  75.8 kg 124 mL/hr over 30 Minutes Intravenous  Once 10/16/21 2239 10/17/21 0358   10/16/21 2300  ceFEPIme (MAXIPIME) 1 g in sodium chloride 0.9 % 100 mL IVPB        1 g 200 mL/hr over 30 Minutes Intravenous Every 24 hours 10/16/21 2239     10/16/21 2045  vancomycin (VANCOREADY) IVPB 1500 mg/300 mL  Status:  Discontinued        1,500 mg 150 mL/hr over 120 Minutes Intravenous  Once 10/16/21 2006 10/16/21 2218   10/16/21 2015  ciprofloxacin (CIPRO) IVPB 400 mg        400 mg 200 mL/hr over 60 Minutes Intravenous  Once 10/16/21 2006 10/16/21 2223   10/16/21 2015  clindamycin (CLEOCIN) IVPB 600 mg        600 mg 100 mL/hr over 30 Minutes Intravenous  Once 10/16/21 2006 10/16/21 2116        Time Spent in minutes  30   Lala Lund M.D on 10/19/2021 at 11:13 AM  To page go to  [email or web address]   [facility] -  Office  [phone]  See all Orders from today for further details    Objective:   Vitals:   10/18/21 2000 10/19/21 0000 10/19/21 0400 10/19/21 0845  BP: (!) 145/67 (!) 150/68 (!) 146/72 (!) 151/69  Pulse: 74 68 68 70  Resp: 11 14 13 16   Temp:   98.1 F (36.7 C) (!) 97.3 F (36.3 C)  TempSrc:   Axillary   SpO2: 99% 99% 100% 92%  Weight:      Height:        Wt Readings from Last 3 Encounters:  10/18/21 75.4 kg  09/21/21 74.4 kg  07/29/21 73.5 kg     Intake/Output Summary (Last 24 hours) at 10/19/2021 1113 Last data filed at 10/19/2021 0015 Gross per 24 hour  Intake 1075.87 ml  Output 1700 ml  Net -624.13 ml     Physical Exam  Awake Alert, No new F.N deficits,   [place].AT,PERRAL Supple Neck, No JVD,   Symmetrical Chest wall movement, Good  air movement bilaterally, CTAB RRR,No Gallops, Rubs or new Murmurs,  +ve B.Sounds, Abd Soft, No tenderness,   Both Feet are under bandage, pictures from the day of admission below    Foot pictures from 10/17/2021 placed after consent       RN pressure injury documentation: Pressure Injury 04/16/19 Toe (Comment  which one) Left Deep Tissue Injury - Purple or maroon localized area of discolored intact skin or blood-filled blister due to damage of underlying soft tissue from pressure and/or shear. maroon coloring to tip of to (Active)  04/16/19 1500  Location: Toe (Comment  which one) (L great toe)  Location Orientation: Left  Staging: Deep Tissue Injury - Purple or maroon localized area of discolored intact skin or blood-filled blister due to damage of underlying soft tissue from pressure and/or shear.  Wound Description (Comments): maroon coloring to tip of toe  Present on Admission: Yes     Pressure Injury 04/16/19 Toe (Comment  which one) Anterior;Left Stage II -  Partial thickness loss of dermis presenting as a shallow open ulcer with a red, pink wound bed without slough. appears as  opened blister - rubbed from cast (Active)  04/16/19 1500  Location: Toe (Comment  which one) (5th toe - pinky)  Location Orientation: Anterior;Left  Staging: Stage II -  Partial thickness loss of dermis presenting as a shallow open ulcer with a red, pink wound bed without slough.  Wound Description (Comments): appears as opened blister - rubbed from cast  Present on Admission: Yes     Data Review:    CBC Recent Labs  Lab 10/16/21 1956 10/18/21 0719 10/18/21 1830 10/19/21 0331  WBC 42.1* 16.1*  --  14.5*  HGB 8.2* 7.1* 8.0* 7.8*  HCT 25.6* 21.3* 24.5* 23.8*  PLT 215 181  --  161  MCV 82.8 78.3*  --  81.0  MCH 26.5 26.1  --  26.5  MCHC 32.0 33.3  --  32.8  RDW 18.6* 18.7*  --  17.6*  LYMPHSABS 0.0* 0.9  --  1.1  MONOABS 1.3* 1.4*  --  1.4*  EOSABS 0.0 0.1  --  0.1  BASOSABS 0.0 0.0  --  0.0    Electrolytes Recent Labs  Lab 10/16/21 1956 10/17/21 0617 10/17/21 0852 10/17/21 1403 10/17/21 1804 10/18/21 0719 10/19/21 0331  NA 120* 125*  --   --  125* 125* 126*  K 6.6* 5.4*  --   --  4.4 4.8 4.5  CL 93* 97*  --   --  94* 92* 93*  CO2 15* 18*  --   --  20* 23 23  GLUCOSE 181* 153*  --   --  139* 130* 90  BUN 141* 135*  --   --  129* 121* 112*  CREATININE 8.24* 7.05*  --   --  6.55* 5.92* 5.22*  CALCIUM 7.8* 7.0*  --   --  7.1* 6.9* 7.2*  AST 65* 63*  --   --   --  48* 35  ALT 28 25  --   --   --  26 23  ALKPHOS 144* 126  --   --   --  99 101  BILITOT 0.7 1.0  --   --   --  0.9 0.9  ALBUMIN 1.9* 1.6*  --   --   --  1.7* 1.6*  MG  --   --  2.0  --   --  1.8 1.9  CRP  --   --   --   --   --  17.4* 17.6*  LATICACIDVEN 1.1  --   --   --   --   --   --   INR  --   --   --  1.4*  --   --   --   HGBA1C  --  7.1*  --   --   --   --   --   BNP  --   --   --   --   --  423.7* 410.7*    ------------------------------------------------------------------------------------------------------------------ No results for input(s): CHOL, HDL, LDLCALC, TRIG, CHOLHDL, LDLDIRECT  in the last 72 hours.  Lab Results  Component Value Date   HGBA1C 7.1 (H) 10/17/2021    No results for input(s): TSH, T4TOTAL, T3FREE, THYROIDAB in the last 72 hours.  Invalid input(s): FREET3 ------------------------------------------------------------------------------------------------------------------ ID Labs Recent Labs  Lab 10/16/21 1956 10/17/21 0617 10/17/21 1804 10/18/21 0719 10/19/21 0331  WBC 42.1*  --   --  16.1* 14.5*  PLT 215  --   --  181 161  CRP  --   --   --  17.4* 17.6*  LATICACIDVEN 1.1  --   --   --   --   CREATININE 8.24* 7.05* 6.55* 5.92* 5.22*   Cardiac Enzymes No results for input(s): CKMB, TROPONINI, MYOGLOBIN in the last 168 hours.  Invalid input(s): CK   Radiology Reports DG Chest 1 View  Result Date: 10/16/2021 CLINICAL DATA:  Weakness, known left rib fractures EXAM: CHEST  1 VIEW COMPARISON:  None. FINDINGS: Lungs are well expanded, symmetric, and clear. No pneumothorax or pleural effusion. Cardiac size within normal limits. Pulmonary vascularity is normal. Osseous structures are age-appropriate. Known left ninth and tenth rib fractures is not well visualized on this examination. Healed left clavicular mid-diaphyseal fracture noted. IMPRESSION: No active disease. Known left ninth and tenth rib fractures are not well visualized on this exam. Electronically Signed   By: Fidela Salisbury M.D.   On: 10/16/2021 19:52   CT Head Wo Contrast  Result Date: 10/16/2021 CLINICAL DATA:  Increasing weakness over the past week with subsequent fall, initial encounter EXAM: CT HEAD WITHOUT CONTRAST CT CERVICAL SPINE WITHOUT CONTRAST TECHNIQUE: Multidetector CT imaging of the head and cervical spine was performed following the standard protocol without intravenous contrast. Multiplanar CT image reconstructions of the cervical spine were also generated. COMPARISON:  02/13/2020 FINDINGS: CT HEAD FINDINGS Brain: No evidence of acute infarction, hemorrhage,  hydrocephalus, extra-axial collection or mass lesion/mass effect. Vascular: No hyperdense vessel or unexpected calcification. Skull: Normal. Negative for fracture or focal lesion. Sinuses/Orbits: No acute finding. Other: None. CT CERVICAL SPINE FINDINGS Alignment: Within normal limits. Skull base and vertebrae: 7 cervical segments are well visualized. Vertebral body height is well maintained. Disc space narrowing with osteophytic changes are noted at C5-6. Mild facet hypertrophic changes are noted. No acute fracture or acute facet abnormality is noted. Soft tissues and spinal canal: No prevertebral fluid or swelling. No visible canal hematoma. Upper chest: Visualized lung apices are within normal limits Other: None IMPRESSION: CT of the head: No acute intracranial abnormality noted. CT of the cervical spine: Mild degenerative changes without acute abnormality. Electronically Signed   By: Inez Catalina M.D.   On: 10/16/2021 19:58   CT Cervical Spine Wo Contrast  Result Date: 10/16/2021 CLINICAL DATA:  Increasing weakness over the past week with subsequent fall, initial encounter EXAM: CT HEAD WITHOUT CONTRAST CT CERVICAL SPINE WITHOUT CONTRAST TECHNIQUE: Multidetector CT imaging of the head and cervical spine was performed  following the standard protocol without intravenous contrast. Multiplanar CT image reconstructions of the cervical spine were also generated. COMPARISON:  02/13/2020 FINDINGS: CT HEAD FINDINGS Brain: No evidence of acute infarction, hemorrhage, hydrocephalus, extra-axial collection or mass lesion/mass effect. Vascular: No hyperdense vessel or unexpected calcification. Skull: Normal. Negative for fracture or focal lesion. Sinuses/Orbits: No acute finding. Other: None. CT CERVICAL SPINE FINDINGS Alignment: Within normal limits. Skull base and vertebrae: 7 cervical segments are well visualized. Vertebral body height is well maintained. Disc space narrowing with osteophytic changes are noted at  C5-6. Mild facet hypertrophic changes are noted. No acute fracture or acute facet abnormality is noted. Soft tissues and spinal canal: No prevertebral fluid or swelling. No visible canal hematoma. Upper chest: Visualized lung apices are within normal limits Other: None IMPRESSION: CT of the head: No acute intracranial abnormality noted. CT of the cervical spine: Mild degenerative changes without acute abnormality. Electronically Signed   By: Inez Catalina M.D.   On: 10/16/2021 19:58   MR FOOT RIGHT WO CONTRAST  Result Date: 10/17/2021 CLINICAL DATA:  Diabetic foot ulcer EXAM: MRI OF THE RIGHT FOREFOOT WITHOUT CONTRAST TECHNIQUE: Multiplanar, multisequence MR imaging of the right forefoot was performed. No intravenous contrast was administered. COMPARISON:  X-ray 10/16/2021 FINDINGS: Bones/Joint/Cartilage Postsurgical changes of prior fifth ray resection. Extensive bone marrow edema with intermediate-low T1 signal changes at the fourth metatarsal base and proximal diaphysis as well as throughout the adjacent cuboid, compatible with acute osteomyelitis. Fluid within the fourth TMT joint space suggests septic arthritis. Patchy marrow edema throughout the remaining bones of the midfoot including the cuneiform bones and lateral portion of the navicular bone. Similar patchy marrow edema involves the bases of the first, second, and third metatarsals. There are areas of intermediate T1 signal intensity within these bones which favor a component of early acute osteomyelitis. No acute fracture or dislocation. Ligaments Lisfranc ligament is poorly defined. Collateral ligaments of the MTP joints are intact. Muscles and Tendons Marked edematous changes of the visualized foot musculature likely reflecting a combination of myositis and denervation. No focal tenosynovial fluid collection. Soft tissues Soft tissue ulceration at the dorso lateral aspect of the forefoot overlying the level of the fourth metatarsal diaphysis.  Additional deep soft tissue ulceration along the plantar aspect of the forefoot underlying the level of the fourth metatarsal base with tract extending to the underlying bone. Two small fluid collections within the soft tissues near the level of the fourth TMT joint measuring up to 1.3 cm. Diffuse circumferential soft tissue edema. Numerous foci of susceptibility within the soft tissues in the region of the midfoot compatible with subcutaneous emphysema seen radiographically. IMPRESSION: 1. Acute osteomyelitis of the fourth metatarsal base and proximal diaphysis as well as throughout the adjacent cuboid. Fluid within the fourth TMT joint suggests septic arthritis. 2. Patchy marrow edema throughout the remaining bones of the midfoot as well as the metatarsal bases. Findings favor a component of early acute osteomyelitis. 3. Soft tissue ulcerations at the dorsolateral aspect of the forefoot overlying the level of the fourth metatarsal diaphysis. Additional deep soft tissue ulceration along the plantar aspect of the forefoot underlying the level of the fourth metatarsal base with tract extending to the underlying bone. Two small abscesses within the soft tissues near the level of the fourth TMT joint measuring up to 1.3 cm. 4. Numerous foci of susceptibility within the soft tissues in the region of the midfoot compatible with subcutaneous emphysema seen radiographically. 5. Marked edematous changes of the  visualized foot musculature likely reflecting a combination of myositis and denervation. Electronically Signed   By: Davina Poke D.O.   On: 10/17/2021 08:20   DG Foot Complete Left  Result Date: 10/16/2021 CLINICAL DATA:  Bilateral foot wound EXAM: LEFT FOOT - COMPLETE 3+ VIEW COMPARISON:  None. FINDINGS: Posttraumatic or postsurgical change involving the left calcaneus is again noted with truncation of the posterior calcaneus. No acute fracture or dislocation. Large soft tissue ulcer is seen subjacent to  the calcaneus, increased in size since prior examination. There is extensive soft tissue swelling of the left hindfoot. There has developed subtle periosteal reaction and sclerosis involving the inferior aspect of the calcaneus which may reflect changes of a chronic osteomyelitis in this location. IMPRESSION: Enlarging, large soft tissue ulcer involving the plantar aspect of the left hindfoot with subjacent sclerosis and subtle periosteal reaction involving the calcaneus suspicious for changes of chronic osteomyelitis in this location. Extensive surrounding soft tissue swelling. Electronically Signed   By: Fidela Salisbury M.D.   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 There has developed a large soft tissue ulceration involving the lateral, plantar aspect of the right midfoot subjacent to the a cuboid. No associated osseous erosion to suggest osteomyelitis. There is extensive soft tissue swelling of the right mid and forefoot. Subcutaneous edema noted within the right ankle. There are multiple foci of gas noted within the subcutaneous soft tissues of the right forefoot dorsally and midfoot medially, possibly related to changes of direct trauma or aggressive infection. No acute fracture or dislocation. Moderate plantar and superior calcaneal spurs are noted. IMPRESSION: Soft tissue ulcer involving the lateral, plantar right midfoot. Extensive soft tissue swelling of the right foot and ankle. Subcutaneous gas within the right forefoot and midfoot remote from the ulcer may reflect changes of direct trauma or aggressive infection. Electronically Signed   By: Fidela Salisbury M.D.   On: 10/16/2021 19:55   ECHOCARDIOGRAM COMPLETE  Result Date: 10/18/2021    ECHOCARDIOGRAM REPORT   Patient Name:   SOLA MARGOLIS Date of Exam: 10/18/2021  Medical Rec #:  [id number]         Height:       65.0 in Accession #:    [id number]        Weight:       166.2 lb Date of Birth:  1956/07/21         BSA:          1.829 m Patient Age:    73 years          BP:           127/55 mmHg Patient Gender: F                 HR:           78 bpm. Exam Location:  Inpatient Procedure: 2D Echo, Cardiac Doppler, Color Doppler and Intracardiac            Opacification Agent Indications:    Bacteremia  History:        Patient has prior history of Echocardiogram examinations, most                 recent 11/17/2020. Previous Myocardial Infarction,  Signs/Symptoms:Bacteremia and Dyspnea; Risk                 Factors:Dyslipidemia, Diabetes, Hypertension and Sleep Apnea.  Sonographer:    Roseanna Rainbow RDCS Referring Phys: [id number] GREGORY D CALONE  Sonographer Comments: Technically difficult study due to poor echo windows and patient is morbidly obese. Image acquisition challenging due to patient body habitus. Patient states she has broken ribs. IMPRESSIONS  1. Left ventricular ejection fraction, by estimation, is 55 to 60%. The left ventricle has normal function. The left ventricle has no regional wall motion abnormalities. There is mild concentric left ventricular hypertrophy. Left ventricular diastolic parameters were normal.  2. Right ventricular systolic function is normal. The right ventricular size is mildly enlarged. There is moderately elevated pulmonary artery systolic pressure. The estimated right ventricular systolic pressure is 34.2 mmHg.  3. Left atrial size was mildly dilated.  4. The mitral valve is normal in structure. Trivial mitral valve regurgitation.  5. The aortic valve is grossly normal. Aortic valve regurgitation is not visualized. No aortic stenosis is present.  6. The inferior vena cava is dilated in size with <50% respiratory variability, suggesting right atrial pressure of 15 mmHg. Comparison(s): Changes from prior study are noted. RV slightly enlarged  compared to prior study. Conclusion(s)/Recommendation(s): No evidence of valvular vegetations on this transthoracic echocardiogram. Consider a transesophageal echocardiogram to exclude infective endocarditis if clinically indicated. FINDINGS  Left Ventricle: Left ventricular ejection fraction, by estimation, is 55 to 60%. The left ventricle has normal function. The left ventricle has no regional wall motion abnormalities. The left ventricular internal cavity size was normal in size. There is  mild concentric left ventricular hypertrophy. Left ventricular diastolic parameters were normal. Right Ventricle: The right ventricular size is mildly enlarged. No increase in right ventricular wall thickness. Right ventricular systolic function is normal. There is moderately elevated pulmonary artery systolic pressure. The tricuspid regurgitant velocity is 2.94 m/s, and with an assumed right atrial pressure of 15 mmHg, the estimated right ventricular systolic pressure is 87.6 mmHg. Left Atrium: Left atrial size was mildly dilated. Right Atrium: Right atrial size was normal in size. Pericardium: There is no evidence of pericardial effusion. Mitral Valve: The mitral valve is normal in structure. Trivial mitral valve regurgitation. Tricuspid Valve: The tricuspid valve is normal in structure. Tricuspid valve regurgitation is mild . No evidence of tricuspid stenosis. Aortic Valve: The aortic valve is grossly normal. Aortic valve regurgitation is not visualized. No aortic stenosis is present. Pulmonic Valve: The pulmonic valve was not well visualized. Pulmonic valve regurgitation is not visualized. No evidence of pulmonic stenosis. Aorta: The aortic root, ascending aorta and aortic arch are all structurally normal, with no evidence of dilitation or obstruction. Venous: The inferior vena cava is dilated in size with less than 50% respiratory variability, suggesting right atrial pressure of 15 mmHg. IAS/Shunts: The atrial septum is  grossly normal.  LEFT VENTRICLE PLAX 2D LVIDd:         4.50 cm      Diastology LVIDs:         3.10 cm      LV e' medial:    9.03 cm/s LV PW:         1.30 cm      LV E/e' medial:  10.4 LV IVS:        1.30 cm      LV e' lateral:   8.81 cm/s LVOT diam:     2.00 cm  LV E/e' lateral: 10.7 LV SV:         73 LV SV Index:   40 LVOT Area:     3.14 cm  LV Volumes (MOD) LV vol d, MOD A2C: 119.0 ml LV vol d, MOD A4C: 102.0 ml LV vol s, MOD A2C: 48.8 ml LV vol s, MOD A4C: 38.9 ml LV SV MOD A2C:     70.2 ml LV SV MOD A4C:     102.0 ml LV SV MOD BP:      65.8 ml RIGHT VENTRICLE             IVC RV S prime:     15.40 cm/s  IVC diam: 2.50 cm TAPSE (M-mode): 2.2 cm LEFT ATRIUM             Index        RIGHT ATRIUM           Index LA diam:        4.00 cm 2.19 cm/m   RA Area:     14.00 cm LA Vol (A2C):   54.8 ml 29.97 ml/m  RA Volume:   33.70 ml  18.43 ml/m LA Vol (A4C):   47.9 ml 26.20 ml/m LA Biplane Vol: 53.7 ml 29.37 ml/m  AORTIC VALVE LVOT Vmax:   126.00 cm/s LVOT Vmean:  80.700 cm/s LVOT VTI:    0.232 m  AORTA Ao Root diam: 3.00 cm Ao Asc diam:  3.30 cm MITRAL VALVE                TRICUSPID VALVE MV Area (PHT): 2.69 cm     TR Peak grad:   34.6 mmHg MV Decel Time: 282 msec     TR Vmax:        294.00 cm/s MV E velocity: 94.30 cm/s MV A velocity: 104.00 cm/s  SHUNTS MV E/A ratio:  0.91         Systemic VTI:  0.23 m                             Systemic Diam: 2.00 cm Buford Dresser MD Electronically signed by Buford Dresser MD Signature Date/Time: 10/18/2021/5:28:19 PM    Final    CT CHEST ABDOMEN PELVIS WO CONTRAST  Result Date: 10/16/2021 CLINICAL DATA:  Altered mental status, fall, found down, abdominal distension. Abdominal pain, acute, nonlocalized Abdominal trauma, blunt. EXAM: CT CHEST, ABDOMEN AND PELVIS WITHOUT CONTRAST TECHNIQUE: Multidetector CT imaging of the chest, abdomen and pelvis was performed following the standard protocol without IV contrast. COMPARISON:  08/23/2021 FINDINGS: CT CHEST  FINDINGS Cardiovascular: Extensive multi-vessel coronary artery calcification. Global cardiac size within normal limits. No pericardial effusion. Central pulmonary arteries are of normal caliber. Mild atherosclerotic calcification within the thoracic aorta. No aortic aneurysm. Mediastinum/Nodes: No enlarged mediastinal, hilar, or axillary lymph nodes. Thyroid gland, trachea, and esophagus demonstrate no significant findings. Lungs/Pleura: Mild bibasilar atelectasis. Minimal left basilar pleural thickening superficial to acute left posterior rib fractures. The lungs are otherwise clear. No pneumothorax or pleural effusion. Central airways are widely patent. Musculoskeletal: There are acute mildly displaced fractures of the left ninth and tenth ribs posteriorly. CT ABDOMEN PELVIS FINDINGS Hepatobiliary: The liver contour is nodular in keeping with changes of cirrhosis. 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 No definite solid intrahepatic mass identified. No intrahepatic biliary ductal dilation. There is mild extrahepatic biliary ductal dilation, progressive since prior  examination, with the extrahepatic bile duct measuring up to 10 mm in diameter. Pancreas: Unremarkable Spleen: The spleen is mildly enlarged measuring 14.1 cm in greatest dimension, stable since prior examination. No intrasplenic masses are seen. Adrenals/Urinary Tract: Adrenal glands are unremarkable. Kidneys are normal, without renal calculi, focal lesion, or hydronephrosis. Bladder is unremarkable. Stomach/Bowel: Stomach is within normal limits. Appendix appears normal. No evidence of bowel wall thickening, distention, or inflammatory changes. Vascular/Lymphatic: Aortic atherosclerosis. No enlarged abdominal or pelvic lymph nodes. Reproductive: Status post hysterectomy. No adnexal masses. Other: Tiny fat containing umbilical hernia.  No ascites. Musculoskeletal: L1  corpectomy and T11-L3 posterior lumbar fusion with instrumentation has been performed. No definite bridging callus or incorporation of interbody bone graft is identified along the fused segment. Additionally, there is periprosthetic lucency involving the pedicle screws of T11, L2, and L3 as well as fracture of the left pedicle screw of T11 and migration of the proximal aspects of the T11 pedicle screws into the T10-11 disc space. Together, the findings suggest motion along the fused segment. No acute fracture identified. IMPRESSION: Acute mildly displaced fractures of the left ninth and tenth ribs posteriorly. No pneumothorax. Extensive multi-vessel coronary artery calcification. Cirrhosis. 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 L1 corpectomy and T11-L3 posterior lumbar fusion procedure with findings suggesting motion along the fused segment as noted above. This may be better assessed with dedicated CT imaging, if indicated. Aortic Atherosclerosis (ICD10-I70.0). Electronically Signed   By: Fidela Salisbury M.D.   On: 10/16/2021 19:50

## 2021-10-19 NOTE — Progress Notes (Signed)
Patient ID: [name], adult   DOB: Aug 10, 1956, 65 y.o.   MRN: [id number] Patient is much more alert and oriented this morning.  She can talk.  I reviewed the laboratory studies, imaging, and discussed the recommendation to proceed with bilateral below-knee amputations.  Patient states that she has to do what her husband wants her to do and she states that her husband does not want her to have an amputation.  I discussed this is life-threatening and potentially life ending infection.  Patient states she understands but cannot go against the wishes of her husband.  I discussed that I would be out of town for 5 days and if there are any changes in her desire for surgery I could proceed with surgery in 1 week.

## 2021-10-19 NOTE — Progress Notes (Signed)
[facility]  Date of Admission:  10/16/2021     Total days of antibiotics 4         ASSESSMENT:  [name] [name]'s blood cultures from 10/18/21 are without growth in 24 hours. TTE without vegetations or evidence of endocarditis. [name] [name] continues to decline surgical intervention despite education regarding increased risk of failure, worsening infection or possibly death. Will treat with 6 weeks of antibiotics for now with Ceftriaxone and oral doxycycline with end date of 11/29/21. Continue metronidazole for 2 weeks through 11/01/21 and then discontinue. Okay for PICC line once cultures clear for 48 hours. OPAT and Home Health below. Remaining medical and supportive care per primary team.   ID will sign off - please re-consult if needed.   PLAN:  Continue current dose of ceftriaxone and metronidazole.  Change daptomycin to oral doxycycline. Continue wound care per Wound RN recommendations.  Follow up with Orthopedics as directed.  Home Health / OPAT orders  PICC line once cultures clear for 48 hours Follow up in ID office.  Remaining medical and supportive care per primary team.   Diagnosis: Osteomyelitis   Culture Result: Streptococcus intermedius  Allergies  Allergen Reactions   Bee Pollen Anaphylaxis   Fish-Derived Products Hives, Shortness Of Breath, Swelling and Rash    Hives get in throat causing trouble breathing   Mushroom Extract Complex Anaphylaxis   Penicillins Anaphylaxis    **Tolerated cefepime March 2021 Did it involve swelling of the face/tongue/throat, SOB, or low BP? Yes Did it involve sudden or severe rash/hives, skin peeling, or any reaction on the inside of your mouth or nose? No Did you need to seek medical attention at a hospital or doctor's office? Yes When did it last happen?  A few months ago If all above answers are "NO", may proceed with cephalosporin use.   Rosemary Oil Anaphylaxis   Shellfish Allergy Hives, Shortness Of  Breath, Swelling and Rash   Tomato Hives and Shortness Of Breath    Hives in throat causes her trouble breathing   Acetaminophen Other (See Comments)    GI upset   Aloe Vera Hives   Broccoli [Brassica Oleracea] Hives   Acyclovir And Related Other (See Comments)    Unknown reaction   Naproxen Other (See Comments)    Unknown reaction    OPAT Orders Discharge antibiotics to be given via PICC line Discharge antibiotics:Ceftriaxone  Per pharmacy protocol  Duration: 6 weeks  End Date: 11/29/21  [facility] Care Per Protocol:  Home health RN for IV administration and teaching; PICC line care and labs.    Labs weekly while on IV antibiotics: _X_ CBC with differential _X_ BMP __ CMP _X_ CRP __X ESR __ Vancomycin trough __ CK  _X_ Please pull PIC at completion of IV antibiotics __ Please leave PIC in place until doctor has seen patient or been notified  Fax weekly labs to [phone]  Clinic Follow Up Appt:  11/09/21 at 3:30 with Dr. Gale Journey     Principal Problem:   Osteomyelitis of left foot [facility]) Active Problems:   Essential hypertension   Cellulitis of right foot   Hyponatremia   Hyperkalemia   Acute kidney injury superimposed on CKD (HCC)   Leukocytosis   Diabetic foot ulcers (HCC)   DM (diabetes mellitus), type 2 with renal complications (HCC)   Generalized weakness   Bacteremia    apixaban  5 mg Oral BID   carvedilol  6.25 mg Oral BID WC  Chlorhexidine Gluconate Cloth  6 each Topical Daily   cholecalciferol  400 Units Oral Daily   ferrous sulfate  325 mg Oral BID WC   folic acid  2 mg Oral Daily   insulin aspart  0-6 Units Subcutaneous Q6H   insulin glargine-yfgn  15 Units Subcutaneous Q2200   pantoprazole  40 mg Oral Daily    SUBJECTIVE:  Afebrile overnight with no acute events. More awake today. Continues to have pain in bilateral feet.   Allergies  Allergen Reactions   Bee Pollen Anaphylaxis   Fish-Derived Products Hives, Shortness Of Breath,  Swelling and Rash    Hives get in throat causing trouble breathing   Mushroom Extract Complex Anaphylaxis   Penicillins Anaphylaxis    **Tolerated cefepime March 2021 Did it involve swelling of the face/tongue/throat, SOB, or low BP? Yes Did it involve sudden or severe rash/hives, skin peeling, or any reaction on the inside of your mouth or nose? No Did you need to seek medical attention at a hospital or doctor's office? Yes When did it last happen?  A few months ago If all above answers are "NO", may proceed with cephalosporin use.   Rosemary Oil Anaphylaxis   Shellfish Allergy Hives, Shortness Of Breath, Swelling and Rash   Tomato Hives and Shortness Of Breath    Hives in throat causes her trouble breathing   Acetaminophen Other (See Comments)    GI upset   Aloe Vera Hives   Broccoli [Brassica Oleracea] Hives   Acyclovir And Related Other (See Comments)    Unknown reaction   Naproxen Other (See Comments)    Unknown reaction     Review of Systems: Review of Systems  Constitutional:  Negative for chills, fever and weight loss.  Respiratory:  Negative for cough, shortness of breath and wheezing.   Cardiovascular:  Negative for chest pain and leg swelling.  Gastrointestinal:  Negative for abdominal pain, constipation, diarrhea, nausea and vomiting.  Musculoskeletal:        Bilateral lower extremity pain  Skin:  Negative for rash.     OBJECTIVE: Vitals:   10/18/21 2000 10/19/21 0000 10/19/21 0400 10/19/21 0845  BP: (!) 145/67 (!) 150/68 (!) 146/72 (!) 151/69  Pulse: 74 68 68 70  Resp: 11 14 13 16   Temp:   98.1 F (36.7 C) (!) 97.3 F (36.3 C)  TempSrc:   Axillary   SpO2: 99% 99% 100% 92%  Weight:      Height:       Body mass index is 27.66 kg/m.  Physical Exam Constitutional:      General: She is not in acute distress.    Appearance: She is well-developed.  Cardiovascular:     Rate and Rhythm: Normal rate and regular rhythm.     Heart sounds: Normal heart  sounds.  Pulmonary:     Effort: Pulmonary effort is normal.     Breath sounds: Normal breath sounds.  Skin:    General: Skin is warm and dry.  Neurological:     Mental Status: She is alert and oriented to person, place, and time.  Psychiatric:        Mood and Affect: Mood normal.    Lab Results Lab Results  Component Value Date   WBC 14.5 (H) 10/19/2021   HGB 7.8 (L) 10/19/2021   HCT 23.8 (L) 10/19/2021   MCV 81.0 10/19/2021   PLT 161 10/19/2021    Lab Results  Component Value Date   CREATININE 5.22 (H) 10/19/2021  BUN 112 (H) 10/19/2021   NA 126 (L) 10/19/2021   K 4.5 10/19/2021   CL 93 (L) 10/19/2021   CO2 23 10/19/2021    Lab Results  Component Value Date   ALT 23 10/19/2021   AST 35 10/19/2021   ALKPHOS 101 10/19/2021   BILITOT 0.9 10/19/2021     Microbiology: Recent Results (from the past 240 hour(s))  Culture, blood (Routine X 2) w Reflex to ID Panel     Status: Abnormal (Preliminary result)   Collection Time: 10/16/21  7:56 PM   Specimen: BLOOD  Result Value Ref Range Status   Specimen Description BLOOD RIGHT ANTECUBITAL  Final   Special Requests   Final    BOTTLES DRAWN AEROBIC AND ANAEROBIC Blood Culture adequate volume   Culture  Setup Time   Final    GRAM POSITIVE COCCI IN CHAINS Organism ID to follow CRITICAL RESULT CALLED TO, READ BACK BY AND VERIFIED WITH: Salome Holmes [facility] 2106 10/17/21 A BROWNING IN BOTH AEROBIC AND ANAEROBIC BOTTLES    Culture (A)  Final    STREPTOCOCCUS INTERMEDIUS SUSCEPTIBILITIES TO FOLLOW Performed at [facility], [place] [address] [place], [place] [address]    Report Status PENDING  Incomplete  Blood Culture ID Panel (Reflexed)     Status: Abnormal   Collection Time: 10/16/21  7:56 PM  Result Value Ref Range Status   Enterococcus faecalis NOT DETECTED NOT DETECTED Final   Enterococcus Faecium NOT DETECTED NOT DETECTED Final   Listeria monocytogenes NOT DETECTED NOT DETECTED Final   Staphylococcus species NOT  DETECTED NOT DETECTED Final   Staphylococcus aureus (BCID) NOT DETECTED NOT DETECTED Final   Staphylococcus epidermidis NOT DETECTED NOT DETECTED Final   Staphylococcus lugdunensis NOT DETECTED NOT DETECTED Final   Streptococcus species DETECTED (A) NOT DETECTED Final    Comment: Not Enterococcus species, Streptococcus agalactiae, Streptococcus pyogenes, or Streptococcus pneumoniae. CRITICAL RESULT CALLED TO, READ BACK BY AND VERIFIED WITH: Salome Holmes [facility] 2106 10/17/21 A BROWNING    Streptococcus agalactiae NOT DETECTED NOT DETECTED Final   Streptococcus pneumoniae NOT DETECTED NOT DETECTED Final   Streptococcus pyogenes NOT DETECTED NOT DETECTED Final   A.calcoaceticus-baumannii NOT DETECTED NOT DETECTED Final   Bacteroides fragilis NOT DETECTED NOT DETECTED Final   Enterobacterales NOT DETECTED NOT DETECTED Final   Enterobacter cloacae complex NOT DETECTED NOT DETECTED Final   Escherichia coli NOT DETECTED NOT DETECTED Final   Klebsiella aerogenes NOT DETECTED NOT DETECTED Final   Klebsiella oxytoca NOT DETECTED NOT DETECTED Final   Klebsiella pneumoniae NOT DETECTED NOT DETECTED Final   Proteus species NOT DETECTED NOT DETECTED Final   Salmonella species NOT DETECTED NOT DETECTED Final   Serratia marcescens NOT DETECTED NOT DETECTED Final   Haemophilus influenzae NOT DETECTED NOT DETECTED Final   Neisseria meningitidis NOT DETECTED NOT DETECTED Final   Pseudomonas aeruginosa NOT DETECTED NOT DETECTED Final   Stenotrophomonas maltophilia NOT DETECTED NOT DETECTED Final   Candida albicans NOT DETECTED NOT DETECTED Final   Candida auris NOT DETECTED NOT DETECTED Final   Candida glabrata NOT DETECTED NOT DETECTED Final   Candida krusei NOT DETECTED NOT DETECTED Final   Candida parapsilosis NOT DETECTED NOT DETECTED Final   Candida tropicalis NOT DETECTED NOT DETECTED Final   Cryptococcus neoformans/gattii NOT DETECTED NOT DETECTED Final    Comment: Performed at [facility]  Lab, [address], [place], [place] [address]  Resp Panel by RT-PCR (Flu A&B, Covid) Nasopharyngeal Swab  Status: None   Collection Time: 10/16/21  8:34 PM   Specimen: Nasopharyngeal Swab; Nasopharyngeal(NP) swabs in vial transport medium  Result Value Ref Range Status   SARS Coronavirus 2 by RT PCR NEGATIVE NEGATIVE Final    Comment: (NOTE) SARS-CoV-2 target nucleic acids are NOT DETECTED.  The SARS-CoV-2 RNA is generally detectable in upper respiratory specimens during the acute phase of infection. The lowest concentration of SARS-CoV-2 viral copies this assay can detect is 138 copies/mL. A negative result does not preclude SARS-Cov-2 infection and should not be used as the sole basis for treatment or other patient management decisions. A negative result may occur with  improper specimen collection/handling, submission of specimen other than nasopharyngeal swab, presence of viral mutation(s) within the areas targeted by this assay, and inadequate number of viral copies(<138 copies/mL). A negative result must be combined with clinical observations, patient history, and epidemiological information. The expected result is Negative.  Fact Sheet for Patients:  EntrepreneurPulse.com.au  Fact Sheet for Healthcare Providers:  IncredibleEmployment.be  This test is no t yet approved or cleared by the [place] FDA and  has been authorized for detection and/or diagnosis of SARS-CoV-2 by FDA under an Emergency Use Authorization (EUA). This EUA will remain  in effect (meaning this test can be used) for the duration of the COVID-19 declaration under Section 564(b)(1) of the Act, 21 U.S.C.section 360bbb-3(b)(1), unless the authorization is terminated  or revoked sooner.       Influenza A by PCR NEGATIVE NEGATIVE Final   Influenza B by PCR NEGATIVE NEGATIVE Final    Comment: (NOTE) The Xpert Xpress SARS-CoV-2/FLU/RSV plus assay is intended as an  aid in the diagnosis of influenza from Nasopharyngeal swab specimens and should not be used as a sole basis for treatment. Nasal washings and aspirates are unacceptable for Xpert Xpress SARS-CoV-2/FLU/RSV testing.  Fact Sheet for Patients: EntrepreneurPulse.com.au  Fact Sheet for Healthcare Providers: IncredibleEmployment.be  This test is not yet approved or cleared by the [place] FDA and has been authorized for detection and/or diagnosis of SARS-CoV-2 by FDA under an Emergency Use Authorization (EUA). This EUA will remain in effect (meaning this test can be used) for the duration of the COVID-19 declaration under Section 564(b)(1) of the Act, 21 U.S.C. section 360bbb-3(b)(1), unless the authorization is terminated or revoked.  Performed at [facility], [place] [address], [place], [place] [address]   MRSA Next Gen by PCR, Nasal     Status: None   Collection Time: 10/17/21  1:16 PM   Specimen: Nasal Mucosa; Nasal Swab  Result Value Ref Range Status   MRSA by PCR Next Gen NOT DETECTED NOT DETECTED Final    Comment: (NOTE) The GeneXpert MRSA Assay (FDA approved for NASAL specimens only), is one component of a comprehensive MRSA colonization surveillance program. It is not intended to diagnose MRSA infection nor to guide or monitor treatment for MRSA infections. Test performance is not FDA approved in patients less than 18 years old. Performed at [facility], [place] [address] [place], [place] [address]   Culture, blood (routine x 2)     Status: None (Preliminary result)   Collection Time: 10/18/21  6:30 PM   Specimen: Site Not Specified; Blood  Result Value Ref Range Status   Specimen Description SITE NOT SPECIFIED  Final   Special Requests   Final    BOTTLES DRAWN AEROBIC AND ANAEROBIC Blood Culture adequate volume   Culture  Setup Time   Final  NO ORGANISMS SEEN AEROBIC BOTTLE ONLY reincubated 10/19/21@2 :06 by TW     Culture   Final    NO GROWTH < 24 HOURS Performed at [facility] [address] [place], [place] [address]    Report Status PENDING  Incomplete  Culture, blood (routine x 2)     Status: None (Preliminary result)   Collection Time: 10/18/21  6:30 PM   Specimen: Site Not Specified; Blood  Result Value Ref Range Status   Specimen Description SITE NOT SPECIFIED  Final   Special Requests   Final    BOTTLES DRAWN AEROBIC AND ANAEROBIC Blood Culture results may not be optimal due to an excessive volume of blood received in culture bottles   Culture   Final    NO GROWTH < 24 HOURS Performed at [facility] [address] [place], [place] [address]    Report Status PENDING  Incomplete     Terri Piedra, [facility]  10/19/2021  1:04 PM

## 2021-10-19 NOTE — Progress Notes (Signed)
PHARMACY CONSULT NOTE FOR:  OUTPATIENT  PARENTERAL ANTIBIOTIC THERAPY (OPAT)  Indication: B/L foot osteomyelitis Regimen: Rocephin 2g IV every 24 hours + Doxy 100 mg po every 12 hours for 6 weeks, Flagyl 500 mg po twice daily for 2 weeks End date: 11/29/21 (Rocephin + Doxy), 11/01/21 (Metronidazole)  IV antibiotic discharge orders are pended. To discharging provider:  please sign these orders via discharge navigator,  Select New Orders & click on the button choice - Manage This Unsigned Work.     Thank you for allowing pharmacy to be a part of this patients care.  Alycia Rossetti, PharmD, BCPS Clinical Pharmacist 10/19/2021 2:03 PM   **Pharmacist phone directory can now be found on [email or web address] ([email or web address]).  Listed under [place].

## 2021-10-19 NOTE — Care Management Important Message (Signed)
Important Message  Patient Details  Name: [name] [name] MRN: [id number] Date of Birth: Apr 28, 1956   Medicare Important Message Given:  Yes     Arley Garant 10/19/2021, 1:45 PM

## 2021-10-19 NOTE — Progress Notes (Signed)
ANTICOAGULATION CONSULT NOTE- follow-up  Pharmacy Consult for Heparin Indication: hx of DVT  Allergies  Allergen Reactions   Bee Pollen Anaphylaxis   Fish-Derived Products Hives, Shortness Of Breath, Swelling and Rash    Hives get in throat causing trouble breathing   Mushroom Extract Complex Anaphylaxis   Penicillins Anaphylaxis    **Tolerated cefepime March 2021 Did it involve swelling of the face/tongue/throat, SOB, or low BP? Yes Did it involve sudden or severe rash/hives, skin peeling, or any reaction on the inside of your mouth or nose? No Did you need to seek medical attention at a hospital or doctor's office? Yes When did it last happen?  A few months ago If all above answers are "NO", may proceed with cephalosporin use.   Rosemary Oil Anaphylaxis   Shellfish Allergy Hives, Shortness Of Breath, Swelling and Rash   Tomato Hives and Shortness Of Breath    Hives in throat causes her trouble breathing   Acetaminophen Other (See Comments)    GI upset   Aloe Vera Hives   Broccoli [Brassica Oleracea] Hives   Acyclovir And Related Other (See Comments)    Unknown reaction   Naproxen Other (See Comments)    Unknown reaction    Patient Measurements: Height: 5' 5"  (165.1 cm) Weight: 75.4 kg (166 lb 3.6 oz) IBW/kg (Calculated) : 57  Heparin Dosing Weight: 72.6 kg  Vital Signs: BP: 150/68 (12/21 0000) Pulse Rate: 68 (12/21 0000)  Labs: Recent Labs    10/16/21 1956 10/17/21 0617 10/17/21 1403 10/17/21 1403 10/17/21 1804 10/17/21 2318 10/18/21 0719 10/18/21 1830 10/19/21 0331  HGB 8.2*  --   --   --   --   --  7.1* 8.0* 7.8*  HCT 25.6*  --   --   --   --   --  21.3* 24.5* 23.8*  PLT 215  --   --   --   --   --  181  --  161  APTT  --   --  31  --  43*  --   --   --   --   LABPROT  --   --  16.7*  --   --   --   --   --   --   INR  --   --  1.4*  --   --   --   --   --   --   HEPARINUNFRC  --   --  <0.10*   < > <0.10*   < > 0.11* <0.10* 0.17*  CREATININE 8.24*  7.05*  --   --  6.55*  --  5.92*  --   --   CKTOTAL  --  697*  --   --   --   --   --   --   --    < > = values in this interval not displayed.     Estimated Creatinine Clearance (by C-G formula based on SCr of 5.92 mg/dL (H)) Female: 9.6 mL/min (A) Female: 11.8 mL/min (A)  Assessment: 65 y.o. adult with a PMH significant for CKD 4 and hx of DVTs who presented with AMS. Heparin per pharmacy consult placed for hx of DVT.  12/21 AM update:  Heparin level low  Goal of Therapy:  Heparin level 0.3-0.7 units/ml Monitor platelets by anticoagulation protocol: Yes   Plan:  Inc heparin to 1850 units/hr 1300 heparin level    [name], PharmD, BCPS Clinical Pharmacist Phone: [phone]

## 2021-10-19 NOTE — Patient Instructions
Wean off clonidine as follows:  Take 0.3mg  at night for 1 week.  Then take 0.2mg   at night for 3 days.  Then take 0.1mg  at night for 3days  Then take 0.1mg  at night every other day for one week.  Then take 0.1mg  every 2 days for 1 week and then stop taking clonidine.    Start carvedilol 12.5mg   twice daily  Continue amlodipine 10mg  daily  Check your blood pressure daily whilst weaning off clonidine.  Goal blood pressure <130/4mmHg.  If  the top number of your blood pressure >159mmHg, you may need to be seen in the ER.  If the top number of your blood pressure is <124mmHg; hold your pressure medications, reassess 2-3 hours later and if higher resume medicaitons.  Please monitor your blood pressure 2-3 times a week.  Keep a log of your blood pressure measurements and bring along this log to your clinic appointment.    Keep to a low-salt diet.  Please take the blood pressure medications as prescribed .

## 2021-10-19 NOTE — Progress Notes
History      CC: Chronic kidney disease    HPI: Rhonda Fletcher is a 65 y.o. female with past medical history of diabetes mellitus, hypertension.    Known diabetic for the past 25 years without documented retinopathy.   Known hypertensive for the past 10 years.  I first saw her on 07/27/2021.  At that time she id not assess her blood pressure at home.  Denied taking NSAIDS. Denies history of nephrolithiasis.  Denied new rash or joint swelling. Denie chronic diarrhea. She had no changes in her urine.  On 07/27/2021 we addressed hyperkalemia with dietary changes.    07/20/2021 Interval history: Episodes of episodes . Now reports constipation. She had a colonoscopy last year which was normal.  She admits she has not been adherent.  Home BP : SBP peak is around 120.  No changes in urine; denies dysuria.      Past Medical History   Diabetes mellitus  Hypertension    Family History  No known family history of kidney disease    Social History  Retired since 2003.    Medication    HOME MEDS  ? ACCU-CHEK GUIDE TEST STRIPS test strip USE AS DIRECTED TO TEST BLOOD SUGAR TWICE DAILY BEFORE MEALS. (Patient taking differently: Checking roughly once weekly)   ? albuterol sulfate (PROAIR HFA) 90 mcg/actuation HFA aerosol inhaler Inhale two puffs by mouth into the lungs every 6 hours as needed.   ? amLODIPine (NORVASC) 10 mg tablet TAKE 1 TABLET EVERY DAY   ? betamethasone valerate (VALISONE) 0.1 % topical cream APPLY TOPICALLY TO AFFECTED AREA DAILY   ? Blood Glucose Control, Normal soln Use in glucose meter, Accu-check Aviva   ? 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 carvediloL (COREG) 12.5 mg tablet Take one tablet by mouth twice daily with meals. Take with food.   ? cetirizine (ZYRTEC) 10 mg tablet Take 10 mg by mouth daily.     ? cholecalciferol(+) (VITAMIN D-3) 2,000 unit tablet Take 1 Tab by mouth daily.   ? cloNIDine HCL (CATAPRES) 0.1 mg tablet Take 0.1 mg by mouth as Needed.   ? cloNIDine HCL (CATAPRES) 0.2 mg tablet TAKE 1 TABLET THREE TIMES DAILY (INCREASING DOSE)   ? cyanocobalamin (VITAMIN B-12) 500 mcg tablet Take 500 mcg by mouth daily.   ? cyclobenzaprine (FLEXERIL) 5 mg tablet TAKE 1 TABLET AT BEDTIME   ? DROPSAFE ALCOHOL PREP PADS USE TO CLEAN SKIN BEFORE FINGERSTICK   ? famotidine (PEPCID) 20 mg tablet TAKE 1 TABLET TWICE DAILY   ? ferrous sulfate (FEOSOL) 325 mg (65 mg iron) tablet Take 325 mg by mouth every 48 hours.   ? fluticasone propionate (FLONASE) 50 mcg/actuation nasal spray, suspension USE 2 SPRAYS IN EACH NOSTRIL AS DIRECTED DAILY. SHAKE BOTTLE GENTLY BEFORE USING   ? glimepiride (AMARYL) 2 mg tablet TAKE 1 TABLET EVERY DAY   ? lancets MISC Use one each as directed daily before breakfast. Diag: E11.29   ? pantoprazole DR (PROTONIX) 40 mg tablet TAKE 1 TABLET EVERY DAY   ? pioglitazone (ACTOS) 15 mg tablet Take one tablet by mouth daily.          Review of Systems  Constitutional: negative  Eyes: negative  Ears, nose, mouth, throat, and face: negative  Respiratory: negative  Cardiovascular: negative  Gastrointestinal: negative  Genitourinary:negative  Integument/breast: negative  Hematologic/lymphatic: negative  Musculoskeletal:negative  Neurological: negative  Endocrine: negative  Physical Exam        Vitals:    10/19/21 0851 10/19/21 0859   BP: 139/72 (!) 140/83  Comment: light headed and dizzy   BP Source: Arm, Right Upper Arm, Right Upper   Pulse: 82 92   Temp: Comment: afebrile per pt.    PainSc: Two    Weight: 73.7 kg (162 lb 8 oz)    Height: 166.4 cm (5' 5.5)      Body mass index is 26.63 kg/m?[name] Kitchen     Repeat BP 144/36mmHg    Gen: Alert and Oriented, No Acute Distress   HEENT: Sclera normal; MMM  CV:  S1 and S2 normal, no rubs, murmurs or gallops   Pulm: Clear to Auscultation bilateral   GI: BS+ x4, non-tender to palpation  Neuro: Grossly normal, moving all extremities, speech intact  Ext: no edema, clubbing or cyanosis   Skin: no rash Assessment and Plan        Rhonda Fletcher is a 65 y.o. female with past medical history of diabetes mellitus and hypertension.      -Chronic kidney disease  The patient has had CKD since at least 2019.  Her baseline serum creatinine has fluctuated between 1.5 to 2 mg/dL.  She had some microalbuminuria on her previous urine test but in April 2022 she did not have significant microalbuminuria.  Her renal ultrasound from August 2022 did not show any gross lesions in the kidney but the signs of the kidney were on the smaller side.  She has metabolic acidosis and anemia.  Episodes of metabolic acidosis have been present since at least 2019 and was out of proportion to her kidney function.  This suggests a prominent tubulointerstitial process.  Her most recent eye exam did not show evidence of retinopathy.  Together, the absence of significant albuminuria, absence of retinopathy on eye exam, small size kidneys makes diabetes nephropathy less likely in this patient.  For the reasons stated above prominent glomerulopathy is also unlikely.  It is possible that previous episodes of acute tubular injury have progressed to CKD.  I do not think that work-up for GN and renal biopsy would be helpful in guiding management of this patient.However renal biopsy may prove useful during subsequent work-up for kidney transplant in terms of knowing the etiology and predicting recurrence.      Patient prefers home dialysis; send her to see NP Harvin Hazel to discuss renal replacement therapies.    Did not tolerate statins in the past    I placed kidney Transplant referral      --Metabolic acidosis: Patient could not tolerate sodium bicarbonate tablets. She finds it difficult to drink baking soda solution.  I discussed with her to add baking soda to  Her food    --Hyperkalemia: This was also present on labs from July 2022.  From dietary recall we identified identified sweet potatoes could be a contributor.  I have given the patient a list on low potassium diet.  Follow-up BMP from today.    - Hypertension  As per Dr. Cherrie Distance note she has not tolerated ACE inhibitor or ARB in the past.  Blood pressure  Is not well controlled.  I discussed with her about weaning her off clonidine and starting carvedilol and she is agreeable.      - Leukocyturia  Patient is asymptomatic.  Continue to monitor.      - Diabetes mellitus  She has difficulties with her diabetes control and her last A1c was 8.3% from  05/27/2021.  Follow up with primary.    Doran Durand, MD      Patient Instructions     Wean off clonidine as follows:  Take 0.3mg  at night for 1 week.  Then take 0.2mg   at night for 3 days.  Then take 0.1mg  at night for 3days  Then take 0.1mg  at night every other day for one week.  Then take 0.1mg  every 2 days for 1 week and then stop taking clonidine.    Start carvedilol 12.5mg   twice daily  Continue amlodipine 10mg  daily  Check your blood pressure daily whilst weaning off clonidine.  Goal blood pressure <130/74mmHg.  If  the top number of your blood pressure >18mmHg, you may need to be seen in the ER.  If the top number of your blood pressure is <120mmHg; hold your pressure medications, reassess 2-3 hours later and if higher resume medicaitons.  Please monitor your blood pressure 2-3 times a week.  Keep a log of your blood pressure measurements and bring along this log to your clinic appointment.    Keep to a low-salt diet.  Please take the blood pressure medications as prescribed .

## 2021-10-19 NOTE — Progress Notes (Signed)
ANTICOAGULATION CONSULT NOTE - Initial Consult  Pharmacy Consult for apixaban Indication: h/o of DVT  Allergies  Allergen Reactions   Bee Pollen Anaphylaxis   Fish-Derived Products Hives, Shortness Of Breath, Swelling and Rash    Hives get in throat causing trouble breathing   Mushroom Extract Complex Anaphylaxis   Penicillins Anaphylaxis    **Tolerated cefepime March 2021 Did it involve swelling of the face/tongue/throat, SOB, or low BP? Yes Did it involve sudden or severe rash/hives, skin peeling, or any reaction on the inside of your mouth or nose? No Did you need to seek medical attention at a hospital or doctor's office? Yes When did it last happen?  A few months ago If all above answers are "NO", may proceed with cephalosporin use.   Rosemary Oil Anaphylaxis   Shellfish Allergy Hives, Shortness Of Breath, Swelling and Rash   Tomato Hives and Shortness Of Breath    Hives in throat causes her trouble breathing   Acetaminophen Other (See Comments)    GI upset   Aloe Vera Hives   Broccoli [Brassica Oleracea] Hives   Acyclovir And Related Other (See Comments)    Unknown reaction   Naproxen Other (See Comments)    Unknown reaction    Patient Measurements: Height: 5' 5"  (165.1 cm) Weight: 75.4 kg (166 lb 3.6 oz) IBW/kg (Calculated) : 57   Vital Signs: Temp: 97.3 F (36.3 C) (12/21 0845) Temp Source: Axillary (12/21 0400) BP: 151/69 (12/21 0845) Pulse Rate: 70 (12/21 0845)  Labs: Recent Labs    10/16/21 1956 10/17/21 0617 10/17/21 1403 10/17/21 1403 10/17/21 1804 10/17/21 2318 10/18/21 0719 10/18/21 1830 10/19/21 0331  HGB 8.2*  --   --   --   --   --  7.1* 8.0* 7.8*  HCT 25.6*  --   --   --   --   --  21.3* 24.5* 23.8*  PLT 215  --   --   --   --   --  181  --  161  APTT  --   --  31  --  43*  --   --   --   --   LABPROT  --   --  16.7*  --   --   --   --   --   --   INR  --   --  1.4*  --   --   --   --   --   --   HEPARINUNFRC  --   --  <0.10*   < >  <0.10*   < > 0.11* <0.10* 0.17*  CREATININE 8.24* 7.05*  --   --  6.55*  --  5.92*  --  5.22*  CKTOTAL  --  697*  --   --   --   --   --   --   --    < > = values in this interval not displayed.    Estimated Creatinine Clearance (by C-G formula based on SCr of 5.22 mg/dL (H)) Female: 10.9 mL/min (A) Female: 13.4 mL/min (A)   Medical History: Past Medical History:  Diagnosis Date   Acute MI ([place]) 2007   presented to ED & had cardiac cath- but found to have normal coronaries. Since that point in time her PCP cares f or cardiac needs. Dr. 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   unknown   atorvastatin (LIPITOR) 10 MG tablet Take 1 tablet by mouth once daily (Patient taking differently: Take 10 mg by mouth daily.) 90 tablet 1 10/15/2021   diazepam (VALIUM) 5 MG tablet Take 1 tablet (5 mg total) by mouth every 12 (twelve) hours as needed for anxiety. 30 tablet 0 10/15/2021   DULoxetine (CYMBALTA) 60 MG capsule Take 1 capsule by mouth once daily (Patient taking differently: Take 60 mg by mouth daily.) 90 capsule 1 62/70/3500   folic acid (FOLVITE) 1 MG tablet Take 2 tablets by mouth once daily (Patient taking differently: Take 2 mg by mouth daily.) 60 tablet 0 10/15/2021   furosemide (LASIX) 20 MG tablet Take 1 tablet (20 mg total) by mouth daily. (Patient taking differently: Take 40 mg by mouth daily.) 90 tablet 3 10/15/2021   HYDROcodone-acetaminophen (NORCO/VICODIN) 5-325 MG tablet Take 1 tablet by mouth every 6 (six) hours as needed for moderate pain. 120 tablet 0 10/16/2021   insulin glargine (LANTUS SOLOSTAR) 100 UNIT/ML Solostar Pen INJECT 20 UNITS SUBCUTANEOUSLY IN THE MORNING AND 40 AT NIGHT (Patient taking differently: 15-18 Units at bedtime.) 15 mL 3 10/15/2021   montelukast (SINGULAIR) 10 MG tablet TAKE 1 TABLET BY MOUTH AT BEDTIME (Patient taking differently: Take 10 mg by mouth at bedtime.) 30 tablet 2 10/15/2021   pregabalin (LYRICA) 75 MG capsule TAKE 1 CAPSULE BY MOUTH THREE TIMES DAILY (Patient taking differently: Take 75 mg by mouth 2 (two) times daily.) 270 capsule 0    VITAMIN D PO Take 2 capsules by mouth daily.   10/15/2021   Continuous Blood Gluc Receiver (FREESTYLE LIBRE 14 DAY  READER) DEVI 1 Device by Does not apply route daily. 2 each 6    Continuous Blood Gluc Sensor (FREESTYLE LIBRE 14 DAY SENSOR) MISC 1 Device by Does not apply route daily. 2 each 6    Multiple Vitamin (MULTIVITAMIN WITH MINERALS) TABS tablet Take 1 tablet by mouth daily. (Patient not taking: Reported on 10/17/2021)   Not Taking   ondansetron (ZOFRAN) 8 MG tablet Take 1 tablet (8 mg total) by mouth every 8 (eight) hours as needed for nausea or vomiting. (Patient not taking: Reported on 10/17/2021) 20 tablet 0 Not Taking    Assessment: 65 yo female with h/o of DVT on xarelto PTA. Due to compromised renal function, MD wishes to switch patient from xarelto to apixaban. Patient currently on heparin drip. Will start apixaban at 49m BID  Goal of Therapy:   Monitor platelets by anticoagulation protocol: Yes   Plan:  D/c heparin drip Start apixaban 533mPO BID Do not continue xarelto at discharge  Jamyron Redd A. PiLevada DyPharmD, BCPS, FNKF Clinical Pharmacist [place] Please utilize Amion for appropriate phone number to reach the unit pharmacist ([place] 10/19/2021,11:27 AM

## 2021-10-19 NOTE — Progress Notes (Signed)
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  Chest diabetic foot ulcer right foot with changes consistent with possible osteomyelitis in the left heel.  Baseline creatinine 3.5 mg/dL.  Increased to 8 mg/dL.  She was also hyperkalemic with a potassium of 6.5 and hyponatremic with a sodium of 120.  She follows with Dr. Carolin Sicks at [facility].  Clinical evaluation in the emergency room was consistent with volume depletion.  She was started on IV fluids.  No urgent indication for renal replacement therapy.  Temporizing measures for her hyperkalemia and treatment of her hyponatremia with IV saline.  Blood pressure 151/69 pulse 72 temperature 97.3 O2 sats 93% nasal cannula  Urine output 2.1 L 10/17/2021  Sodium 126 potassium 4.5 chloride 93 CO2 23 BUN 112 creatinine 5.22 glucose 90 calcium 7.2 albumin 1.6 hemoglobin 7.8  Medications: Lipitor 10 mg daily, insulin, Lokelma 10 g 3 times daily   IV Maxipime IV Cipro  IV clindamycin IV daptomycin  IV sodium bicarbonate at 100 cc an hour  Urethral catheter in place  Objective:  Vital signs in last 24 hours:  Temp:  [97.3 F (36.3 C)-98.9 F (37.2 C)] 97.3 F (36.3 C) (12/21 0845) Pulse Rate:  [68-78] 70 (12/21 0845) Resp:  [11-16] 16 (12/21 0845) BP: (121-151)/(49-72) 151/69 (12/21 0845) SpO2:  [92 %-100 %] 92 % (12/21 0845)  Weight change:  Filed Weights   10/16/21 2223 10/17/21 1053 10/18/21 0500  Weight: 75.8 kg 75.8 kg 75.4 kg    Intake/Output: I/O last 3 completed shifts: In: 1075.9 [I.V.:445.9; Blood:330; IV Piggyback:300] Out: 3700 [Urine:3700]   Intake/Output this shift:  No intake/output data recorded.  CVS- RRR no murmurs rubs gallops RS- CTA no wheezes or rales ABD- BS present soft non-distended no ascites or  masses EXT- no edema right foot with large ulcer   Basic Metabolic Panel: Recent Labs  Lab 10/16/21 1956 10/17/21 0617 10/17/21 0852 10/17/21 1804 10/18/21 0719 10/19/21 0331  NA 120* 125*  --  125* 125* 126*  K 6.6* 5.4*  --  4.4 4.8 4.5  CL 93* 97*  --  94* 92* 93*  CO2 15* 18*  --  20* 23 23  GLUCOSE 181* 153*  --  139* 130* 90  BUN 141* 135*  --  129* 121* 112*  CREATININE 8.24* 7.05*  --  6.55* 5.92* 5.22*  CALCIUM 7.8* 7.0*  --  7.1* 6.9* 7.2*  MG  --   --  2.0  --  1.8 1.9  PHOS  --  8.3*  --   --  7.1*  --      Liver Function Tests: Recent Labs  Lab 10/16/21 1956 10/17/21 0617 10/18/21 0719 10/19/21 0331  AST 65* 63* 48* 35  ALT 28 25 26 23   ALKPHOS 144* 126 99 101  BILITOT 0.7 1.0 0.9 0.9  PROT 7.0 6.1* 6.1* 6.0*  ALBUMIN 1.9* 1.6* 1.7* 1.6*    Recent Labs  Lab 10/16/21 1956  LIPASE 42    No results for input(s): AMMONIA in the last 168 hours.  CBC: Recent Labs  Lab 10/16/21 1956 10/18/21 0719 10/18/21 1830 10/19/21 0331  WBC 42.1* 16.1*  --  14.5*  NEUTROABS 40.8* 13.7*  --  11.7*  HGB 8.2* 7.1* 8.0* 7.8*  HCT 25.6* 21.3* 24.5* 23.8*  MCV 82.8 78.3*  --  81.0  PLT 215 181  --  161     Cardiac Enzymes: Recent Labs  Lab 10/17/21 0617  CKTOTAL 697*     BNP: Invalid input(s): POCBNP  CBG: Recent Labs  Lab 10/18/21 0654 10/18/21 1303 10/18/21 1854 10/19/21 0016 10/19/21 0556  GLUCAP 131* 105* 120* 98 80     Microbiology: Results for orders placed or performed during the hospital encounter of 10/16/21  Culture, blood (Routine X 2) w Reflex to ID Panel     Status: Abnormal (Preliminary result)   Collection Time: 10/16/21  7:56 PM   Specimen: BLOOD  Result Value Ref Range Status   Specimen Description BLOOD RIGHT ANTECUBITAL  Final   Special Requests   Final    BOTTLES DRAWN AEROBIC AND ANAEROBIC Blood Culture adequate volume   Culture  Setup Time   Final    GRAM POSITIVE COCCI IN CHAINS Organism ID to  follow CRITICAL RESULT CALLED TO, READ BACK BY AND VERIFIED WITH: Salome Holmes [facility] 2106 10/17/21 A BROWNING IN BOTH AEROBIC AND ANAEROBIC BOTTLES    Culture (A)  Final    STREPTOCOCCUS INTERMEDIUS SUSCEPTIBILITIES TO FOLLOW Performed at [facility], [place] [address] [place], [place] [address]    Report Status PENDING  Incomplete  Blood Culture ID Panel (Reflexed)     Status: Abnormal   Collection Time: 10/16/21  7:56 PM  Result Value Ref Range Status   Enterococcus faecalis NOT DETECTED NOT DETECTED Final   Enterococcus Faecium NOT DETECTED NOT DETECTED Final   Listeria monocytogenes NOT DETECTED NOT DETECTED Final   Staphylococcus species NOT DETECTED NOT DETECTED Final   Staphylococcus aureus (BCID) NOT DETECTED NOT DETECTED Final   Staphylococcus epidermidis NOT DETECTED NOT DETECTED Final   Staphylococcus lugdunensis NOT DETECTED NOT DETECTED Final   Streptococcus species DETECTED (A) NOT DETECTED Final    Comment: Not Enterococcus species, Streptococcus agalactiae, Streptococcus pyogenes, or Streptococcus pneumoniae. CRITICAL RESULT CALLED TO, READ BACK BY AND VERIFIED WITH: Salome Holmes [facility] 2106 10/17/21 A BROWNING    Streptococcus agalactiae NOT DETECTED NOT DETECTED Final   Streptococcus pneumoniae NOT DETECTED NOT DETECTED Final   Streptococcus pyogenes NOT DETECTED NOT DETECTED Final   A.calcoaceticus-baumannii NOT DETECTED NOT DETECTED Final   Bacteroides fragilis NOT DETECTED NOT DETECTED Final   Enterobacterales NOT DETECTED NOT DETECTED Final   Enterobacter cloacae complex NOT DETECTED NOT DETECTED Final   Escherichia coli NOT DETECTED NOT DETECTED Final   Klebsiella aerogenes NOT DETECTED NOT DETECTED Final   Klebsiella oxytoca NOT DETECTED NOT DETECTED Final   Klebsiella pneumoniae NOT DETECTED NOT DETECTED Final   Proteus species NOT DETECTED NOT DETECTED Final   Salmonella species NOT DETECTED NOT DETECTED Final   Serratia marcescens NOT DETECTED NOT  DETECTED Final   Haemophilus influenzae NOT DETECTED NOT DETECTED Final   Neisseria meningitidis NOT DETECTED NOT DETECTED Final   Pseudomonas aeruginosa NOT DETECTED NOT DETECTED Final   Stenotrophomonas maltophilia NOT DETECTED NOT DETECTED Final   Candida albicans NOT DETECTED NOT DETECTED Final   Candida auris NOT DETECTED NOT DETECTED Final   Candida glabrata NOT DETECTED NOT DETECTED Final   Candida krusei NOT DETECTED NOT DETECTED Final   Candida parapsilosis NOT DETECTED NOT DETECTED Final   Candida tropicalis NOT DETECTED NOT DETECTED Final   Cryptococcus neoformans/gattii NOT DETECTED NOT DETECTED Final    Comment: Performed at [facility], [address], [place], [place] [address]  Resp Panel by RT-PCR (Flu A&B, Covid) Nasopharyngeal  Swab     Status: None   Collection Time: 10/16/21  8:34 PM   Specimen: Nasopharyngeal Swab; Nasopharyngeal(NP) swabs in vial transport medium  Result Value Ref Range Status   SARS Coronavirus 2 by RT PCR NEGATIVE NEGATIVE Final    Comment: (NOTE) SARS-CoV-2 target nucleic acids are NOT DETECTED.  The SARS-CoV-2 RNA is generally detectable in upper respiratory specimens during the acute phase of infection. The lowest concentration of SARS-CoV-2 viral copies this assay can detect is 138 copies/mL. A negative result does not preclude SARS-Cov-2 infection and should not be used as the sole basis for treatment or other patient management decisions. A negative result may occur with  improper specimen collection/handling, submission of specimen other than nasopharyngeal swab, presence of viral mutation(s) within the areas targeted by this assay, and inadequate number of viral copies(<138 copies/mL). A negative result must be combined with clinical observations, patient history, and epidemiological information. The expected result is Negative.  Fact Sheet for Patients:  EntrepreneurPulse.com.au  Fact Sheet for Healthcare  Providers:  IncredibleEmployment.be  This test is no t yet approved or cleared by the [place] FDA and  has been authorized for detection and/or diagnosis of SARS-CoV-2 by FDA under an Emergency Use Authorization (EUA). This EUA will remain  in effect (meaning this test can be used) for the duration of the COVID-19 declaration under Section 564(b)(1) of the Act, 21 U.S.C.section 360bbb-3(b)(1), unless the authorization is terminated  or revoked sooner.       Influenza A by PCR NEGATIVE NEGATIVE Final   Influenza B by PCR NEGATIVE NEGATIVE Final    Comment: (NOTE) The Xpert Xpress SARS-CoV-2/FLU/RSV plus assay is intended as an aid in the diagnosis of influenza from Nasopharyngeal swab specimens and should not be used as a sole basis for treatment. Nasal washings and aspirates are unacceptable for Xpert Xpress SARS-CoV-2/FLU/RSV testing.  Fact Sheet for Patients: EntrepreneurPulse.com.au  Fact Sheet for Healthcare Providers: IncredibleEmployment.be  This test is not yet approved or cleared by the [place] FDA and has been authorized for detection and/or diagnosis of SARS-CoV-2 by FDA under an Emergency Use Authorization (EUA). This EUA will remain in effect (meaning this test can be used) for the duration of the COVID-19 declaration under Section 564(b)(1) of the Act, 21 U.S.C. section 360bbb-3(b)(1), unless the authorization is terminated or revoked.  Performed at [facility], [place] [address], [place], [place] [address]   MRSA Next Gen by PCR, Nasal     Status: None   Collection Time: 10/17/21  1:16 PM   Specimen: Nasal Mucosa; Nasal Swab  Result Value Ref Range Status   MRSA by PCR Next Gen NOT DETECTED NOT DETECTED Final    Comment: (NOTE) The GeneXpert MRSA Assay (FDA approved for NASAL specimens only), is one component of a comprehensive MRSA colonization surveillance program. It is not intended to  diagnose MRSA infection nor to guide or monitor treatment for MRSA infections. Test performance is not FDA approved in patients less than 20 years old. Performed at [facility], [place] [address] [place], [place] [address]   Culture, blood (routine x 2)     Status: None (Preliminary result)   Collection Time: 10/18/21  6:30 PM   Specimen: Site Not Specified; Blood  Result Value Ref Range Status   Specimen Description SITE NOT SPECIFIED  Final   Special Requests   Final    BOTTLES DRAWN AEROBIC AND ANAEROBIC Blood Culture adequate volume   Culture  Setup Time   Final    NO ORGANISMS SEEN AEROBIC BOTTLE ONLY reincubated 10/19/21@2 :06 by TW Performed at [facility], [place] [address], [place], [place] [address]    Culture PENDING  Incomplete   Report Status PENDING  Incomplete   *Note: Due to a large number of results and/or encounters for the requested time period, some results have not been displayed. A complete set of results can be found in Results Review.    Coagulation Studies: Recent Labs    10/17/21 1403  LABPROT 16.7*  INR 1.4*     Urinalysis: Recent Labs    10/17/21 0623  COLORURINE YELLOW  LABSPEC 1.020  PHURINE 5.0  GLUCOSEU NEGATIVE  HGBUR LARGE*  BILIRUBINUR NEGATIVE  KETONESUR NEGATIVE  PROTEINUR NEGATIVE  NITRITE NEGATIVE  LEUKOCYTESUR NEGATIVE       Imaging: ECHOCARDIOGRAM COMPLETE  Result Date: 10/18/2021    ECHOCARDIOGRAM REPORT   Patient Name:   [name] [name] Date of Exam: 10/18/2021 Medical Rec #:  [id number]         Height:       65.0 in Accession #:    [id number]        Weight:       166.2 lb Date of Birth:  Aug 11, 1956         BSA:          1.829 m Patient Age:    32 years          BP:           127/55 mmHg Patient Gender: F                 HR:           78 bpm. Exam Location:  Inpatient Procedure: 2D Echo, Cardiac Doppler, Color Doppler and Intracardiac            Opacification Agent Indications:    Bacteremia  History:         Patient has prior history of Echocardiogram examinations, most                 recent 11/17/2020. Previous Myocardial Infarction,                 Signs/Symptoms:Bacteremia and Dyspnea; Risk                 Factors:Dyslipidemia, Diabetes, Hypertension and Sleep Apnea.  Sonographer:    Roseanna Rainbow RDCS Referring Phys: [id number] GREGORY D CALONE  Sonographer Comments: Technically difficult study due to poor echo windows and patient is morbidly obese. Image acquisition challenging due to patient body habitus. Patient states she has broken ribs. IMPRESSIONS  1. Left ventricular ejection fraction, by estimation, is 55 to 60%. The left ventricle has normal function. The left ventricle has no regional wall motion abnormalities. There is mild concentric left ventricular hypertrophy. Left ventricular diastolic parameters were normal.  2. Right ventricular systolic function is normal. The right ventricular size is mildly enlarged. There is moderately elevated pulmonary artery systolic pressure. The estimated right ventricular systolic pressure is 74.1 mmHg.  3. Left atrial size was mildly dilated.  4. The mitral valve is normal in structure. Trivial mitral valve regurgitation.  5. The aortic valve is grossly normal. Aortic valve regurgitation is not visualized. No aortic stenosis is present.  6. The inferior vena cava is dilated in size with <50% respiratory variability, suggesting right atrial pressure of 15 mmHg. Comparison(s): Changes from prior study are noted. RV slightly enlarged  compared to prior study. Conclusion(s)/Recommendation(s): No evidence of valvular vegetations on this transthoracic echocardiogram. Consider a transesophageal echocardiogram to exclude infective endocarditis if clinically indicated. FINDINGS  Left Ventricle: Left ventricular ejection fraction, by estimation, is 55 to 60%. The left ventricle has normal function. The left ventricle has no regional wall motion abnormalities. The left ventricular  internal cavity size was normal in size. There is  mild concentric left ventricular hypertrophy. Left ventricular diastolic parameters were normal. Right Ventricle: The right ventricular size is mildly enlarged. No increase in right ventricular wall thickness. Right ventricular systolic function is normal. There is moderately elevated pulmonary artery systolic pressure. The tricuspid regurgitant velocity is 2.94 m/s, and with an assumed right atrial pressure of 15 mmHg, the estimated right ventricular systolic pressure is 36.6 mmHg. Left Atrium: Left atrial size was mildly dilated. Right Atrium: Right atrial size was normal in size. Pericardium: There is no evidence of pericardial effusion. Mitral Valve: The mitral valve is normal in structure. Trivial mitral valve regurgitation. Tricuspid Valve: The tricuspid valve is normal in structure. Tricuspid valve regurgitation is mild . No evidence of tricuspid stenosis. Aortic Valve: The aortic valve is grossly normal. Aortic valve regurgitation is not visualized. No aortic stenosis is present. Pulmonic Valve: The pulmonic valve was not well visualized. Pulmonic valve regurgitation is not visualized. No evidence of pulmonic stenosis. Aorta: The aortic root, ascending aorta and aortic arch are all structurally normal, with no evidence of dilitation or obstruction. Venous: The inferior vena cava is dilated in size with less than 50% respiratory variability, suggesting right atrial pressure of 15 mmHg. IAS/Shunts: The atrial septum is grossly normal.  LEFT VENTRICLE PLAX 2D LVIDd:         4.50 cm      Diastology LVIDs:         3.10 cm      LV e' medial:    9.03 cm/s LV PW:         1.30 cm      LV E/e' medial:  10.4 LV IVS:        1.30 cm      LV e' lateral:   8.81 cm/s LVOT diam:     2.00 cm      LV E/e' lateral: 10.7 LV SV:         73 LV SV Index:   40 LVOT Area:     3.14 cm  LV Volumes (MOD) LV vol d, MOD A2C: 119.0 ml LV vol d, MOD A4C: 102.0 ml LV vol s, MOD A2C: 48.8 ml  LV vol s, MOD A4C: 38.9 ml LV SV MOD A2C:     70.2 ml LV SV MOD A4C:     102.0 ml LV SV MOD BP:      65.8 ml RIGHT VENTRICLE             IVC RV S prime:     15.40 cm/s  IVC diam: 2.50 cm TAPSE (M-mode): 2.2 cm LEFT ATRIUM             Index        RIGHT ATRIUM           Index LA diam:        4.00 cm 2.19 cm/m   RA Area:     14.00 cm LA Vol (A2C):   54.8 ml 29.97 ml/m  RA Volume:   33.70 ml  18.43 ml/m LA Vol (A4C):   47.9 ml 26.20 ml/m LA Biplane Vol:  53.7 ml 29.37 ml/m  AORTIC VALVE LVOT Vmax:   126.00 cm/s LVOT Vmean:  80.700 cm/s LVOT VTI:    0.232 m  AORTA Ao Root diam: 3.00 cm Ao Asc diam:  3.30 cm MITRAL VALVE                TRICUSPID VALVE MV Area (PHT): 2.69 cm     TR Peak grad:   34.6 mmHg MV Decel Time: 282 msec     TR Vmax:        294.00 cm/s MV E velocity: 94.30 cm/s MV A velocity: 104.00 cm/s  SHUNTS MV E/A ratio:  0.91         Systemic VTI:  0.23 m                             Systemic Diam: 2.00 cm Buford Dresser MD Electronically signed by Buford Dresser MD Signature Date/Time: 10/18/2021/5:28:19 PM    Final      Medications:    sodium chloride 125 mL/hr at 10/18/21 1641   ceFEPime (MAXIPIME) IV 1 g (10/19/21 0202)   DAPTOmycin (CUBICIN)  IV     heparin 1,750 Units/hr (10/18/21 2112)   metronidazole 500 mg (10/19/21 4742)    Chlorhexidine Gluconate Cloth  6 each Topical Daily   cholecalciferol  400 Units Oral Daily   ferrous sulfate  325 mg Oral BID WC   folic acid  2 mg Oral Daily   insulin aspart  0-6 Units Subcutaneous Q6H   insulin glargine-yfgn  20 Units Subcutaneous Q2200   pantoprazole  40 mg Oral Daily   acetaminophen, ondansetron **OR** ondansetron (ZOFRAN) IV, senna-docusate, traMADol  Assessment/ Plan:  Acute on chronic kidney injury.  Baseline creatinine about 3.5.  Creatinine seems to be slowly improving with IV hydration.  She also has a Foley catheter placed to monitor I's and O's.  It appears unclear whether she had some bladder outlet  obstruction although no hydronephrosis was seen on CT scan.  We will continue to avoid nephrotoxins no ACE inhibitor's no ARB's no nonsteroidal anti-inflammatory drugs avoid IV contrast.  Renally adjust medications follow renal panel on daily basis monitor I's and O's ANEMIA-does not appear to be an issue at this point.  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 Hyperkalemia improved discontinue Lokelma Hyponatremia improved with isotonic solution Metabolic acidosis continues on sodium chloride at 125 cc an hour Diabetic foot ulcers followed by primary service IV antibiotics per primary service  Will sign off patient at this point.  Continue IV hydration with anticipate return of renal function to baseline.  She can follow-up with [facility] regular nephrologist on discharge.  This will be Dr. Carolin Sicks.    LOS: Hardy @TODAY @10 :11 AM

## 2021-10-20 ENCOUNTER — Encounter (HOSPITAL_COMMUNITY): Payer: Self-pay | Admitting: Interventional Radiology

## 2021-10-20 ENCOUNTER — Inpatient Hospital Stay (HOSPITAL_COMMUNITY): Payer: PRIVATE HEALTH INSURANCE

## 2021-10-20 ENCOUNTER — Encounter: Admit: 2021-10-20 | Discharge: 2021-10-20 | Payer: MEDICARE

## 2021-10-20 HISTORY — PX: IR FLUORO GUIDE CV LINE RIGHT: IMG2283

## 2021-10-20 HISTORY — PX: IR US GUIDE VASC ACCESS RIGHT: IMG2390

## 2021-10-20 LAB — CULTURE, BLOOD (ROUTINE X 2): Special Requests: ADEQUATE

## 2021-10-20 LAB — TYPE AND SCREEN
ABO/RH(D): A NEG
Antibody Screen: NEGATIVE
Unit division: 0
Unit division: 0

## 2021-10-20 LAB — BPAM RBC
Blood Product Expiration Date: 202301022359
Blood Product Expiration Date: 202301082359
ISSUE DATE / TIME: 202212201209
Unit Type and Rh: 600
Unit Type and Rh: 600

## 2021-10-20 LAB — COMPREHENSIVE METABOLIC PANEL
ALT: 19 U/L (ref 0–44)
AST: 26 U/L (ref 15–41)
Albumin: 1.5 g/dL — ABNORMAL LOW (ref 3.5–5.0)
Alkaline Phosphatase: 100 U/L (ref 38–126)
Anion gap: 9 (ref 5–15)
BUN: 97 mg/dL — ABNORMAL HIGH (ref 8–23)
CO2: 23 mmol/L (ref 22–32)
Calcium: 7.5 mg/dL — ABNORMAL LOW (ref 8.9–10.3)
Chloride: 95 mmol/L — ABNORMAL LOW (ref 98–111)
Creatinine, Ser: 4.53 mg/dL — ABNORMAL HIGH (ref 0.44–1.00)
GFR, Estimated: 10 mL/min — ABNORMAL LOW (ref >60)
Glucose, Bld: 115 mg/dL — ABNORMAL HIGH (ref 70–99)
Potassium: 4.4 mmol/L (ref 3.5–5.1)
Sodium: 127 mmol/L — ABNORMAL LOW (ref 135–145)
Total Bilirubin: 0.5 mg/dL (ref 0.3–1.2)
Total Protein: 6 g/dL — ABNORMAL LOW (ref 6.5–8.1)

## 2021-10-20 LAB — CBC WITH DIFFERENTIAL/PLATELET
Abs Immature Granulocytes: 0.09 10*3/uL — ABNORMAL HIGH (ref 0.00–0.07)
Basophils Absolute: 0 10*3/uL (ref 0.0–0.1)
Basophils Relative: 0 %
Eosinophils Absolute: 0.1 10*3/uL (ref 0.0–0.5)
Eosinophils Relative: 1 %
HCT: 24.8 % — ABNORMAL LOW (ref 36.0–46.0)
Hemoglobin: 8 g/dL — ABNORMAL LOW (ref 12.0–15.0)
Immature Granulocytes: 1 %
Lymphocytes Relative: 6 %
Lymphs Abs: 0.7 10*3/uL (ref 0.7–4.0)
MCH: 26.4 pg (ref 26.0–34.0)
MCHC: 32.3 g/dL (ref 30.0–36.0)
MCV: 81.8 fL (ref 80.0–100.0)
Monocytes Absolute: 1.1 10*3/uL — ABNORMAL HIGH (ref 0.1–1.0)
Monocytes Relative: 8 %
Neutro Abs: 10.5 10*3/uL — ABNORMAL HIGH (ref 1.7–7.7)
Neutrophils Relative %: 84 %
Platelets: 158 10*3/uL (ref 150–400)
RBC: 3.03 MIL/uL — ABNORMAL LOW (ref 3.87–5.11)
RDW: 17.8 % — ABNORMAL HIGH (ref 11.5–15.5)
WBC: 12.5 10*3/uL — ABNORMAL HIGH (ref 4.0–10.5)
nRBC: 0 % (ref 0.0–0.2)

## 2021-10-20 LAB — C-REACTIVE PROTEIN: CRP: 18 mg/dL — ABNORMAL HIGH (ref <1.0)

## 2021-10-20 LAB — GLUCOSE, CAPILLARY
Glucose-Capillary: 109 mg/dL — ABNORMAL HIGH (ref 70–99)
Glucose-Capillary: 204 mg/dL — ABNORMAL HIGH (ref 70–99)
Glucose-Capillary: 83 mg/dL (ref 70–99)
Glucose-Capillary: 84 mg/dL (ref 70–99)

## 2021-10-20 LAB — MAGNESIUM: Magnesium: 1.8 mg/dL (ref 1.7–2.4)

## 2021-10-20 LAB — BRAIN NATRIURETIC PEPTIDE: B Natriuretic Peptide: 892.6 pg/mL — ABNORMAL HIGH (ref 0.0–100.0)

## 2021-10-20 MED ORDER — INSULIN ASPART 100 UNIT/ML IJ SOLN
0.0000 [IU] | Freq: Three times a day (TID) | INTRAMUSCULAR | Status: DC
  Administered 2021-10-20: 18:00:00 1 [IU] via SUBCUTANEOUS

## 2021-10-20 MED ORDER — HEPARIN SOD (PORK) LOCK FLUSH 100 UNIT/ML IV SOLN
INTRAVENOUS | Status: AC
  Administered 2021-10-20: 17:00:00 500 [IU]
  Filled 2021-10-20: qty 5

## 2021-10-20 MED ORDER — INSULIN GLARGINE-YFGN 100 UNIT/ML ~~LOC~~ SOLN
12.0000 [IU] | Freq: Every day | SUBCUTANEOUS | Status: DC
Start: 2021-10-20 — End: 2021-10-22
  Administered 2021-10-20 – 2021-10-21 (×2): 12 [IU] via SUBCUTANEOUS
  Filled 2021-10-20 (×3): qty 0.12

## 2021-10-20 MED ORDER — LACTATED RINGERS IV SOLN: INTRAVENOUS | Status: DC

## 2021-10-20 MED ORDER — ATORVASTATIN CALCIUM 10 MG PO TABS
10.0000 mg | ORAL_TABLET | Freq: Every day | ORAL | Status: DC
  Administered 2021-10-20 – 2021-10-22 (×3): 10 mg via ORAL
  Filled 2021-10-20 (×3): qty 1

## 2021-10-20 MED ORDER — LIDOCAINE HCL 1 % IJ SOLN
INTRAMUSCULAR | Status: AC
  Administered 2021-10-20: 17:00:00 10 mL
  Filled 2021-10-20: qty 20

## 2021-10-20 NOTE — Procedures (Signed)
Interventional Radiology Procedure Note  Procedure: Placement of right IJ dual lumen tunneled PowerLine. Cath tips in upper RA and ready for use.   Complications: None  Estimated Blood Loss: None  Recommendations: - Routine line care    Signed,  Criselda Peaches, MD

## 2021-10-20 NOTE — Progress Notes (Addendum)
PROGRESS NOTE                                                                                                                                                                                                             Patient Demographics:    [name] [name], is a 65 y.o. adult, DOB - Feb 18, 1956, GYI:[id number]  Outpatient Primary MD for the patient is Carollee Herter, Alferd Apa, DO    LOS - 4  Admit date - 10/16/2021    Chief Complaint  Patient presents with   Altered Mental Status       Brief Narrative (HPI from H&P)    [name] [name] is a 65 y.o. adult with medical history significant for CKD 4, DMT2, CAD, HTN, fibromyalgia, neuropathy of legs, chronic ulcers of feet, history of DVTs, history of renal transplant, history of cirrhosis who presents for generalized weakness that is progressively worsened gradually over the last 2 weeks prior to hospitalization in the ER she was diagnosed with severe sepsis due to bilateral foot cellulitis, AKI on CKD 4, severe dehydration with hyponatremia.  She was admitted for further care   Subjective:   Patient in bed, appears comfortable, denies any headache, no fever, no chest pain or pressure, no shortness of breath , no abdominal pain. No focal weakness.   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  Had detailed discussion with patient's husband on 10/18/2021 and patient on 10/19/2021, again updated both on 10/20/2021, they are still holding off on  surgery despite being adequately warned, will proceed with IJ PICC line.  Strep intermedius growing from 10/16/2021 blood cultures.  Antibiotics per ID have been updated.   -2 weeks bid metronidazole until 10/31/21 -6 weeks bid doxycycline until 11/28/21 -6 weeks iv ceftriaxone 2 gram daily until 11/28/21   2.  AKI on CKD/renal transplant with severe dehydration and hyponatremia.  Currently being hydrated with IV fluids,  still has Foley catheter on board, nephrology following.  Will give trial of Foley removal in the next day or so.  3.  Dyslipidemia.  Replaced on statin.  4.  History of DVTs was on Xarelto.  To compromise renal function will place on Eliquis right now, since no surgery is planned stop heparin drip.  5.  Anemia of chronic disease worse with heme dilution due to IV fluids.  After consent 1 unit of packed RBC placement on 10/18/2021, placed on ferrous sulfate and folic acid and monitor.    6. HTN - on Coreg monitor and adjust as needed.  7. DM type II.  On Lantus and sliding scale, dose adjusted again on 10/20/2021 for better control.   Lab Results  Component Value Date   HGBA1C 7.1 (H) 10/17/2021   CBG (last 3)  Recent Labs    10/20/21 0000 10/20/21 0548 10/20/21 0803  GLUCAP 109* 84 83         Condition - Extremely Guarded  Family Communication  : Husband [name] [phone] called 10/17/2021, 10/18/2021  Code Status :  Full  Consults  : Orthopedics, nephrology, ID  PUD Prophylaxis : PPI   Procedures  :     TTE - 1. Left ventricular ejection fraction, by estimation, is 55 to 60%. The left ventricle has normal function. The left ventricle has no regional wall motion abnormalities. There is mild concentric left ventricular hypertrophy. Left ventricular diastolic parameters were normal.  2. Right ventricular systolic function is normal. The right ventricular size is mildly enlarged. There is moderately elevated pulmonary artery systolic pressure. The  estimated right ventricular systolic pressure is 16.0 mmHg.  3. Left atrial size was mildly dilated.  4. The mitral valve is normal in structure. Trivial mitral valve regurgitation.  5. The aortic valve is grossly normal. Aortic valve regurgitation is not visualized. No aortic stenosis is present.  6. The inferior vena cava is dilated in size with <50% respiratory variability, suggesting right atrial pressure of 15 mmHg. Comparison(s): Changes from prior study are noted. RV slightly enlarged compared to prior study. Conclusion(s)/Recommendation(s): No evidence of valvular vegetations on this transthoracic echocardiogram. Consider a transesophageal echocardiogram to exclude infective endocarditis if clinically indicated.   MRI - 1. Acute osteomyelitis of the fourth metatarsal base and proximal diaphysis as well as throughout the adjacent cuboid. Fluid within the fourth TMT joint suggests septic arthritis. 2. Patchy marrow edema throughout the remaining bones of the midfoot as well as the metatarsal bases. Findings favor a component of early acute osteomyelitis. 3. Soft tissue ulcerations at the dorsolateral aspect of the forefoot overlying the level of the fourth metatarsal diaphysis. Additional deep soft tissue ulceration along the plantar aspect of the forefoot underlying the level of the fourth metatarsal base with tract extending to the underlying bone. Two small abscesses within the soft tissues near the level of the fourth TMT joint measuring up to 1.3 cm. 4. Numerous foci of susceptibility within the soft tissues in the region of the midfoot compatible with subcutaneous emphysema seen radiographically. 5. Marked edematous changes of the visualized foot musculature likely reflecting a combination of myositis and denervation.      Disposition Plan  :    Status is: Inpatient  Remains inpatient appropriate because: Sepsis  DVT Prophylaxis  : Eliquis    Lab Results  Component Value Date   PLT 158  10/20/2021    Diet :  Diet Order  DIET SOFT Room service appropriate? Yes; Fluid consistency: Thin  Diet effective now                    Inpatient Medications  Scheduled Meds:  apixaban  5 mg Oral BID   carvedilol  6.25 mg Oral BID WC   Chlorhexidine Gluconate Cloth  6 each Topical Daily   cholecalciferol  400 Units Oral Daily   doxycycline  100 mg Oral Q12H   ferrous sulfate  325 mg Oral BID WC   folic acid  2 mg Oral Daily   insulin aspart  0-6 Units Subcutaneous Q6H   insulin glargine-yfgn  15 Units Subcutaneous Q2200   metroNIDAZOLE  500 mg Oral Q12H   pantoprazole  40 mg Oral Daily   Continuous Infusions:  cefTRIAXone (ROCEPHIN)  IV 2 g (10/20/21 0144)   lactated ringers Stopped (10/20/21 0907)   PRN Meds:.acetaminophen, HYDROmorphone (DILAUDID) injection, ondansetron **OR** ondansetron (ZOFRAN) IV, senna-docusate, traMADol  Antibiotics  :    Anti-infectives (From admission, onward)    Start     Dose/Rate Route Frequency Ordered Stop   10/20/21 0200  cefTRIAXone (ROCEPHIN) 2 g in sodium chloride 0.9 % 100 mL IVPB        2 g 200 mL/hr over 30 Minutes Intravenous Every 24 hours 10/19/21 1401     10/19/21 2200  metroNIDAZOLE (FLAGYL) tablet 500 mg        500 mg Oral Every 12 hours 10/19/21 1401     10/19/21 1500  doxycycline (VIBRA-TABS) tablet 100 mg        100 mg Oral Every 12 hours 10/19/21 1401     10/18/21 2000  DAPTOmycin (CUBICIN) 600 mg in sodium chloride 0.9 % IVPB  Status:  Discontinued        8 mg/kg  75.8 kg 124 mL/hr over 30 Minutes Intravenous Every 48 hours 10/16/21 2245 10/19/21 1401   10/18/21 1030  metroNIDAZOLE (FLAGYL) IVPB 500 mg  Status:  Discontinued        500 mg 100 mL/hr over 60 Minutes Intravenous Every 12 hours 10/18/21 0941 10/19/21 1401   10/16/21 2330  DAPTOmycin (CUBICIN) 600 mg in sodium chloride 0.9 % IVPB        8 mg/kg  75.8 kg 124 mL/hr over 30 Minutes Intravenous  Once 10/16/21 2239 10/17/21 0358    10/16/21 2300  ceFEPIme (MAXIPIME) 1 g in sodium chloride 0.9 % 100 mL IVPB  Status:  Discontinued        1 g 200 mL/hr over 30 Minutes Intravenous Every 24 hours 10/16/21 2239 10/19/21 1401   10/16/21 2045  vancomycin (VANCOREADY) IVPB 1500 mg/300 mL  Status:  Discontinued        1,500 mg 150 mL/hr over 120 Minutes Intravenous  Once 10/16/21 2006 10/16/21 2218   10/16/21 2015  ciprofloxacin (CIPRO) IVPB 400 mg        400 mg 200 mL/hr over 60 Minutes Intravenous  Once 10/16/21 2006 10/16/21 2223   10/16/21 2015  clindamycin (CLEOCIN) IVPB 600 mg        600 mg 100 mL/hr over 30 Minutes Intravenous  Once 10/16/21 2006 10/16/21 2116        Time Spent in minutes  30   Lala Lund M.D on 10/20/2021 at 10:03 AM  To page go to [email or web address]   [facility] -  Office  [phone]  See all Orders from today for further details    Objective:   Vitals:  10/19/21 1915 10/19/21 2000 10/20/21 0000 10/20/21 0400  BP:  (!) 152/70 (!) 159/62 (!) 154/61  Pulse:  82 80   Resp:  14 15 15   Temp:  98 F (36.7 C) 98.7 F (37.1 C) 97.6 F (36.4 C)  TempSrc:  Axillary Axillary Axillary  SpO2: 98% 97% 99% 91%  Weight:      Height:        Wt Readings from Last 3 Encounters:  10/18/21 75.4 kg  09/21/21 74.4 kg  07/29/21 73.5 kg     Intake/Output Summary (Last 24 hours) at 10/20/2021 1003 Last data filed at 10/20/2021 0555 Gross per 24 hour  Intake 1391.01 ml  Output 2500 ml  Net -1108.99 ml     Physical Exam  Awake Alert, No new F.N deficits, Normal affect [place].AT,PERRAL Supple Neck, No JVD,   Symmetrical Chest wall movement, Good air movement bilaterally, CTAB RRR,No Gallops, Rubs or new Murmurs,  +ve B.Sounds, Abd Soft, No tenderness,      Both Feet are under bandage, pictures from the day of admission below    Foot pictures from 10/17/2021 placed after consent       RN pressure injury documentation: Pressure Injury 04/16/19 Toe (Comment  which one)  Left Deep Tissue Injury - Purple or maroon localized area of discolored intact skin or blood-filled blister due to damage of underlying soft tissue from pressure and/or shear. maroon coloring to tip of to (Active)  04/16/19 1500  Location: Toe (Comment  which one) (L great toe)  Location Orientation: Left  Staging: Deep Tissue Injury - Purple or maroon localized area of discolored intact skin or blood-filled blister due to damage of underlying soft tissue from pressure and/or shear.  Wound Description (Comments): maroon coloring to tip of toe  Present on Admission: Yes     Pressure Injury 04/16/19 Toe (Comment  which one) Anterior;Left Stage II -  Partial thickness loss of dermis presenting as a shallow open ulcer with a red, pink wound bed without slough. appears as opened blister - rubbed from cast (Active)  04/16/19 1500  Location: Toe (Comment  which one) (5th toe - pinky)  Location Orientation: Anterior;Left  Staging: Stage II -  Partial thickness loss of dermis presenting as a shallow open ulcer with a red, pink wound bed without slough.  Wound Description (Comments): appears as opened blister - rubbed from cast  Present on Admission: Yes     Data Review:    CBC Recent Labs  Lab 10/16/21 1956 10/18/21 0719 10/18/21 1830 10/19/21 0331 10/20/21 0334  WBC 42.1* 16.1*  --  14.5* 12.5*  HGB 8.2* 7.1* 8.0* 7.8* 8.0*  HCT 25.6* 21.3* 24.5* 23.8* 24.8*  PLT 215 181  --  161 158  MCV 82.8 78.3*  --  81.0 81.8  MCH 26.5 26.1  --  26.5 26.4  MCHC 32.0 33.3  --  32.8 32.3  RDW 18.6* 18.7*  --  17.6* 17.8*  LYMPHSABS 0.0* 0.9  --  1.1 0.7  MONOABS 1.3* 1.4*  --  1.4* 1.1*  EOSABS 0.0 0.1  --  0.1 0.1  BASOSABS 0.0 0.0  --  0.0 0.0    Electrolytes Recent Labs  Lab 10/16/21 1956 10/17/21 0617 10/17/21 0852 10/17/21 1403 10/17/21 1804 10/18/21 0719 10/19/21 0331 10/20/21 0334  NA 120* 125*  --   --  125* 125* 126* 127*  K 6.6* 5.4*  --   --  4.4 4.8 4.5 4.4  CL 93* 97*   --   --  94* 92* 93* 95*  CO2 15* 18*  --   --  20* 23 23 23   GLUCOSE 181* 153*  --   --  139* 130* 90 115*  BUN 141* 135*  --   --  129* 121* 112* 97*  CREATININE 8.24* 7.05*  --   --  6.55* 5.92* 5.22* 4.53*  CALCIUM 7.8* 7.0*  --   --  7.1* 6.9* 7.2* 7.5*  AST 65* 63*  --   --   --  48* 35 26  ALT 28 25  --   --   --  26 23 19   ALKPHOS 144* 126  --   --   --  99 101 100  BILITOT 0.7 1.0  --   --   --  0.9 0.9 0.5  ALBUMIN 1.9* 1.6*  --   --   --  1.7* 1.6* 1.5*  MG  --   --  2.0  --   --  1.8 1.9 1.8  CRP  --   --   --   --   --  17.4* 17.6* 18.0*  LATICACIDVEN 1.1  --   --   --   --   --   --   --   INR  --   --   --  1.4*  --   --   --   --   HGBA1C  --  7.1*  --   --   --   --   --   --   BNP  --   --   --   --   --  423.7* 410.7* 892.6*    ------------------------------------------------------------------------------------------------------------------ No results for input(s): CHOL, HDL, LDLCALC, TRIG, CHOLHDL, LDLDIRECT in the last 72 hours.  Lab Results  Component Value Date   HGBA1C 7.1 (H) 10/17/2021    No results for input(s): TSH, T4TOTAL, T3FREE, THYROIDAB in the last 72 hours.  Invalid input(s): FREET3 ------------------------------------------------------------------------------------------------------------------ ID Labs Recent Labs  Lab 10/16/21 1956 10/17/21 0617 10/17/21 1804 10/18/21 0719 10/19/21 0331 10/20/21 0334  WBC 42.1*  --   --  16.1* 14.5* 12.5*  PLT 215  --   --  181 161 158  CRP  --   --   --  17.4* 17.6* 18.0*  LATICACIDVEN 1.1  --   --   --   --   --   CREATININE 8.24* 7.05* 6.55* 5.92* 5.22* 4.53*   Cardiac Enzymes No results for input(s): CKMB, TROPONINI, MYOGLOBIN in the last 168 hours.  Invalid input(s): CK   Radiology Reports DG Chest 1 View  Result Date: 10/16/2021 CLINICAL DATA:  Weakness, known left rib fractures EXAM: CHEST  1 VIEW COMPARISON:  None. FINDINGS: Lungs are well expanded, symmetric, and clear. No  pneumothorax or pleural effusion. Cardiac size within normal limits. Pulmonary vascularity is normal. Osseous structures are age-appropriate. Known left ninth and tenth rib fractures is not well visualized on this examination. Healed left clavicular mid-diaphyseal fracture noted. IMPRESSION: No active disease. Known left ninth and tenth rib fractures are not well visualized on this exam. Electronically Signed   By: Fidela Salisbury M.D.   On: 10/16/2021 19:52   CT Head Wo Contrast  Result Date: 10/16/2021 CLINICAL DATA:  Increasing weakness over the past week with subsequent fall, initial encounter EXAM: CT HEAD WITHOUT CONTRAST CT CERVICAL SPINE WITHOUT CONTRAST TECHNIQUE: Multidetector CT imaging of the head and cervical spine was performed following the standard protocol without intravenous contrast. Multiplanar  CT image reconstructions of the cervical spine were also generated. COMPARISON:  02/13/2020 FINDINGS: CT HEAD FINDINGS Brain: No evidence of acute infarction, hemorrhage, hydrocephalus, extra-axial collection or mass lesion/mass effect. Vascular: No hyperdense vessel or unexpected calcification. Skull: Normal. Negative for fracture or focal lesion. Sinuses/Orbits: No acute finding. Other: None. CT CERVICAL SPINE FINDINGS Alignment: Within normal limits. Skull base and vertebrae: 7 cervical segments are well visualized. Vertebral body height is well maintained. Disc space narrowing with osteophytic changes are noted at C5-6. Mild facet hypertrophic changes are noted. No acute fracture or acute facet abnormality is noted. Soft tissues and spinal canal: No prevertebral fluid or swelling. No visible canal hematoma. Upper chest: Visualized lung apices are within normal limits Other: None IMPRESSION: CT of the head: No acute intracranial abnormality noted. CT of the cervical spine: Mild degenerative changes without acute abnormality. Electronically Signed   By: Inez Catalina M.D.   On: 10/16/2021 19:58   CT  Cervical Spine Wo Contrast  Result Date: 10/16/2021 CLINICAL DATA:  Increasing weakness over the past week with subsequent fall, initial encounter EXAM: CT HEAD WITHOUT CONTRAST CT CERVICAL SPINE WITHOUT CONTRAST TECHNIQUE: Multidetector CT imaging of the head and cervical spine was performed following the standard protocol without intravenous contrast. Multiplanar CT image reconstructions of the cervical spine were also generated. COMPARISON:  02/13/2020 FINDINGS: CT HEAD FINDINGS Brain: No evidence of acute infarction, hemorrhage, hydrocephalus, extra-axial collection or mass lesion/mass effect. Vascular: No hyperdense vessel or unexpected calcification. Skull: Normal. Negative for fracture or focal lesion. Sinuses/Orbits: No acute finding. Other: None. CT CERVICAL SPINE FINDINGS Alignment: Within normal limits. Skull base and vertebrae: 7 cervical segments are well visualized. Vertebral body height is well maintained. Disc space narrowing with osteophytic changes are noted at C5-6. Mild facet hypertrophic changes are noted. No acute fracture or acute facet abnormality is noted. Soft tissues and spinal canal: No prevertebral fluid or swelling. No visible canal hematoma. Upper chest: Visualized lung apices are within normal limits Other: None IMPRESSION: CT of the head: No acute intracranial abnormality noted. CT of the cervical spine: Mild degenerative changes without acute abnormality. Electronically Signed   By: Inez Catalina M.D.   On: 10/16/2021 19:58   MR FOOT RIGHT WO CONTRAST  Result Date: 10/17/2021 CLINICAL DATA:  Diabetic foot ulcer EXAM: MRI OF THE RIGHT FOREFOOT WITHOUT CONTRAST TECHNIQUE: Multiplanar, multisequence MR imaging of the right forefoot was performed. No intravenous contrast was administered. COMPARISON:  X-ray 10/16/2021 FINDINGS: Bones/Joint/Cartilage Postsurgical changes of prior fifth ray resection. Extensive bone marrow edema with intermediate-low T1 signal changes at the fourth  metatarsal base and proximal diaphysis as well as throughout the adjacent cuboid, compatible with acute osteomyelitis. Fluid within the fourth TMT joint space suggests septic arthritis. Patchy marrow edema throughout the remaining bones of the midfoot including the cuneiform bones and lateral portion of the navicular bone. Similar patchy marrow edema involves the bases of the first, second, and third metatarsals. There are areas of intermediate T1 signal intensity within these bones which favor a component of early acute osteomyelitis. No acute fracture or dislocation. Ligaments Lisfranc ligament is poorly defined. Collateral ligaments of the MTP joints are intact. Muscles and Tendons Marked edematous changes of the visualized foot musculature likely reflecting a combination of myositis and denervation. No focal tenosynovial fluid collection. Soft tissues Soft tissue ulceration at the dorso lateral aspect of the forefoot overlying the level of the fourth metatarsal diaphysis. Additional deep soft tissue ulceration along the plantar aspect of the  forefoot underlying the level of the fourth metatarsal base with tract extending to the underlying bone. Two small fluid collections within the soft tissues near the level of the fourth TMT joint measuring up to 1.3 cm. Diffuse circumferential soft tissue edema. Numerous foci of susceptibility within the soft tissues in the region of the midfoot compatible with subcutaneous emphysema seen radiographically. IMPRESSION: 1. Acute osteomyelitis of the fourth metatarsal base and proximal diaphysis as well as throughout the adjacent cuboid. Fluid within the fourth TMT joint suggests septic arthritis. 2. Patchy marrow edema throughout the remaining bones of the midfoot as well as the metatarsal bases. Findings favor a component of early acute osteomyelitis. 3. Soft tissue ulcerations at the dorsolateral aspect of the forefoot overlying the level of the fourth metatarsal diaphysis.  Additional deep soft tissue ulceration along the plantar aspect of the forefoot underlying the level of the fourth metatarsal base with tract extending to the underlying bone. Two small abscesses within the soft tissues near the level of the fourth TMT joint measuring up to 1.3 cm. 4. Numerous foci of susceptibility within the soft tissues in the region of the midfoot compatible with subcutaneous emphysema seen radiographically. 5. Marked edematous changes of the visualized foot musculature likely reflecting a combination of myositis and denervation. Electronically Signed   By: Davina Poke D.O.   On: 10/17/2021 08:20   DG Foot Complete Left  Result Date: 10/16/2021 CLINICAL DATA:  Bilateral foot wound EXAM: LEFT FOOT - COMPLETE 3+ VIEW COMPARISON:  None. FINDINGS: Posttraumatic or postsurgical change involving the left calcaneus is again noted with truncation of the posterior calcaneus. No acute fracture or dislocation. Large soft tissue ulcer is seen subjacent to the calcaneus, increased in size since prior examination. There is extensive soft tissue swelling of the left hindfoot. There has developed subtle periosteal reaction and sclerosis involving the inferior aspect of the calcaneus which may reflect changes of a chronic osteomyelitis in this location. IMPRESSION: Enlarging, large soft tissue ulcer involving the plantar aspect of the left hindfoot with subjacent sclerosis and subtle periosteal reaction involving the calcaneus suspicious for changes of chronic osteomyelitis in this location. Extensive surrounding soft tissue swelling. Electronically Signed   By: Fidela Salisbury M.D.   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 There has developed a large soft tissue ulceration involving the lateral, plantar aspect of the  right midfoot subjacent to the a cuboid. No associated osseous erosion to suggest osteomyelitis. There is extensive soft tissue swelling of the right mid and forefoot. Subcutaneous edema noted within the right ankle. There are multiple foci of gas noted within the subcutaneous soft tissues of the right forefoot dorsally and midfoot medially, possibly related to changes of direct trauma or aggressive infection. No acute fracture or dislocation. Moderate plantar and superior calcaneal spurs are noted. IMPRESSION: Soft tissue ulcer involving the lateral, plantar right midfoot. Extensive soft tissue swelling of the right foot and ankle. Subcutaneous gas within the right forefoot and midfoot remote from the ulcer may reflect changes of direct trauma or aggressive infection. Electronically Signed   By: Fidela Salisbury M.D.   On: 10/16/2021 19:55   ECHOCARDIOGRAM COMPLETE  Result Date: 10/18/2021    ECHOCARDIOGRAM REPORT   Patient Name:   CHARLYNE ROBERTSHAW Date of Exam: 10/18/2021 Medical Rec #:  [id number]  Height:       65.0 in Accession #:    [id number]        Weight:       166.2 lb Date of Birth:  1956/06/14         BSA:          1.829 m Patient Age:    13 years          BP:           127/55 mmHg Patient Gender: F                 HR:           78 bpm. Exam Location:  Inpatient Procedure: 2D Echo, Cardiac Doppler, Color Doppler and Intracardiac            Opacification Agent Indications:    Bacteremia  History:        Patient has prior history of Echocardiogram examinations, most                 recent 11/17/2020. Previous Myocardial Infarction,                 Signs/Symptoms:Bacteremia and Dyspnea; Risk                 Factors:Dyslipidemia, Diabetes, Hypertension and Sleep Apnea.  Sonographer:    Roseanna Rainbow RDCS Referring Phys: [id number] GREGORY D CALONE  Sonographer Comments: Technically difficult study due to poor echo windows and patient is morbidly obese. Image acquisition challenging due to patient body  habitus. Patient states she has broken ribs. IMPRESSIONS  1. Left ventricular ejection fraction, by estimation, is 55 to 60%. The left ventricle has normal function. The left ventricle has no regional wall motion abnormalities. There is mild concentric left ventricular hypertrophy. Left ventricular diastolic parameters were normal.  2. Right ventricular systolic function is normal. The right ventricular size is mildly enlarged. There is moderately elevated pulmonary artery systolic pressure. The estimated right ventricular systolic pressure is 29.4 mmHg.  3. Left atrial size was mildly dilated.  4. The mitral valve is normal in structure. Trivial mitral valve regurgitation.  5. The aortic valve is grossly normal. Aortic valve regurgitation is not visualized. No aortic stenosis is present.  6. The inferior vena cava is dilated in size with <50% respiratory variability, suggesting right atrial pressure of 15 mmHg. Comparison(s): Changes from prior study are noted. RV slightly enlarged compared to prior study. Conclusion(s)/Recommendation(s): No evidence of valvular vegetations on this transthoracic echocardiogram. Consider a transesophageal echocardiogram to exclude infective endocarditis if clinically indicated. FINDINGS  Left Ventricle: Left ventricular ejection fraction, by estimation, is 55 to 60%. The left ventricle has normal function. The left ventricle has no regional wall motion abnormalities. The left ventricular internal cavity size was normal in size. There is  mild concentric left ventricular hypertrophy. Left ventricular diastolic parameters were normal. Right Ventricle: The right ventricular size is mildly enlarged. No increase in right ventricular wall thickness. Right ventricular systolic function is normal. There is moderately elevated pulmonary artery systolic pressure. The tricuspid regurgitant velocity is 2.94 m/s, and with an assumed right atrial pressure of 15 mmHg, the estimated right  ventricular systolic pressure is 76.5 mmHg. Left Atrium: Left atrial size was mildly dilated. Right Atrium: Right atrial size was normal in size. Pericardium: There is no evidence of pericardial effusion. Mitral Valve: The mitral valve is normal in structure. Trivial mitral valve regurgitation. Tricuspid Valve: The tricuspid valve is normal in structure. Tricuspid  valve regurgitation is mild . No evidence of tricuspid stenosis. Aortic Valve: The aortic valve is grossly normal. Aortic valve regurgitation is not visualized. No aortic stenosis is present. Pulmonic Valve: The pulmonic valve was not well visualized. Pulmonic valve regurgitation is not visualized. No evidence of pulmonic stenosis. Aorta: The aortic root, ascending aorta and aortic arch are all structurally normal, with no evidence of dilitation or obstruction. Venous: The inferior vena cava is dilated in size with less than 50% respiratory variability, suggesting right atrial pressure of 15 mmHg. IAS/Shunts: The atrial septum is grossly normal.  LEFT VENTRICLE PLAX 2D LVIDd:         4.50 cm      Diastology LVIDs:         3.10 cm      LV e' medial:    9.03 cm/s LV PW:         1.30 cm      LV E/e' medial:  10.4 LV IVS:        1.30 cm      LV e' lateral:   8.81 cm/s LVOT diam:     2.00 cm      LV E/e' lateral: 10.7 LV SV:         73 LV SV Index:   40 LVOT Area:     3.14 cm  LV Volumes (MOD) LV vol d, MOD A2C: 119.0 ml LV vol d, MOD A4C: 102.0 ml LV vol s, MOD A2C: 48.8 ml LV vol s, MOD A4C: 38.9 ml LV SV MOD A2C:     70.2 ml LV SV MOD A4C:     102.0 ml LV SV MOD BP:      65.8 ml RIGHT VENTRICLE             IVC RV S prime:     15.40 cm/s  IVC diam: 2.50 cm TAPSE (M-mode): 2.2 cm LEFT ATRIUM             Index        RIGHT ATRIUM           Index LA diam:        4.00 cm 2.19 cm/m   RA Area:     14.00 cm LA Vol (A2C):   54.8 ml 29.97 ml/m  RA Volume:   33.70 ml  18.43 ml/m LA Vol (A4C):   47.9 ml 26.20 ml/m LA Biplane Vol: 53.7 ml 29.37 ml/m  AORTIC VALVE  LVOT Vmax:   126.00 cm/s LVOT Vmean:  80.700 cm/s LVOT VTI:    0.232 m  AORTA Ao Root diam: 3.00 cm Ao Asc diam:  3.30 cm MITRAL VALVE                TRICUSPID VALVE MV Area (PHT): 2.69 cm     TR Peak grad:   34.6 mmHg MV Decel Time: 282 msec     TR Vmax:        294.00 cm/s MV E velocity: 94.30 cm/s MV A velocity: 104.00 cm/s  SHUNTS MV E/A ratio:  0.91         Systemic VTI:  0.23 m                             Systemic Diam: 2.00 cm Buford Dresser MD Electronically signed by Buford Dresser MD Signature Date/Time: 10/18/2021/5:28:19 PM    Final    CT CHEST ABDOMEN PELVIS WO CONTRAST  Result  Date: 10/16/2021 CLINICAL DATA:  Altered mental status, fall, found down, abdominal distension. Abdominal pain, acute, nonlocalized Abdominal trauma, blunt. EXAM: CT CHEST, ABDOMEN AND PELVIS WITHOUT CONTRAST TECHNIQUE: Multidetector CT imaging of the chest, abdomen and pelvis was performed following the standard protocol without IV contrast. COMPARISON:  08/23/2021 FINDINGS: CT CHEST FINDINGS Cardiovascular: Extensive multi-vessel coronary artery calcification. Global cardiac size within normal limits. No pericardial effusion. Central pulmonary arteries are of normal caliber. Mild atherosclerotic calcification within the thoracic aorta. No aortic aneurysm. Mediastinum/Nodes: No enlarged mediastinal, hilar, or axillary lymph nodes. Thyroid gland, trachea, and esophagus demonstrate no significant findings. Lungs/Pleura: Mild bibasilar atelectasis. Minimal left basilar pleural thickening superficial to acute left posterior rib fractures. The lungs are otherwise clear. No pneumothorax or pleural effusion. Central airways are widely patent. Musculoskeletal: There are acute mildly displaced fractures of the left ninth and tenth ribs posteriorly. CT ABDOMEN PELVIS FINDINGS Hepatobiliary: The liver contour is nodular in keeping with changes of cirrhosis. Scattered parenchymal calcifications are noted throughout the  liver likely the sequela of old granulomatous disease. No definite solid intrahepatic mass identified. No intrahepatic biliary ductal dilation. There is mild extrahepatic biliary ductal dilation, progressive since prior examination, with the extrahepatic bile duct measuring up to 10 mm in diameter. Pancreas: Unremarkable Spleen: The spleen is mildly enlarged measuring 14.1 cm in greatest dimension, stable since prior examination. No intrasplenic masses are seen. Adrenals/Urinary Tract: Adrenal glands are unremarkable. Kidneys are normal, without renal calculi, focal lesion, or hydronephrosis. Bladder is unremarkable. Stomach/Bowel: Stomach is within normal limits. Appendix appears normal. No evidence of bowel wall thickening, distention, or inflammatory changes. Vascular/Lymphatic: Aortic atherosclerosis. No enlarged abdominal or pelvic lymph nodes. Reproductive: Status post hysterectomy. No adnexal masses. Other: Tiny fat containing umbilical hernia.  No ascites. Musculoskeletal: L1 corpectomy and T11-L3 posterior lumbar fusion with instrumentation has been performed. No definite bridging callus or incorporation of interbody bone graft is identified along the fused segment. Additionally, there is periprosthetic lucency involving the pedicle screws of T11, L2, and L3 as well as fracture of the left pedicle screw of T11 and migration of the proximal aspects of the T11 pedicle screws into the T10-11 disc space. Together, the findings suggest motion along the fused segment. No acute fracture identified. IMPRESSION: Acute mildly displaced fractures of the left ninth and tenth ribs posteriorly. No pneumothorax. Extensive multi-vessel coronary artery calcification. Cirrhosis. 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 L1 corpectomy and T11-L3 posterior lumbar fusion procedure with findings suggesting motion along the fused segment as noted above. This may be better assessed with dedicated CT  imaging, if indicated. Aortic Atherosclerosis (ICD10-I70.0). Electronically Signed   By: Fidela Salisbury M.D.   On: 10/16/2021 19:50

## 2021-10-20 NOTE — Discharge Instructions (Addendum)
Follow with Primary MD Carollee Herter, Alferd Apa, DO in 7 days   Get CBC, CMP, 2 view Chest X ray -  checked next visit within 1 week by Primary MD    Activity: As tolerated with Full fall precautions use walker/cane & assistance as needed  Disposition Home     Diet: Heart Healthy Low Carb  Accuchecks 4 times/day, Once in AM empty stomach and then before each meal. Log in all results and show them to your Prim.MD in 3 days. If any glucose reading is under 80 or above 300 call your Prim MD immidiately. Follow Low glucose instructions for glucose under 80 as instructed.  Special Instructions: If you have smoked or chewed Tobacco  in the last 2 yrs please stop smoking, stop any regular Alcohol  and or any Recreational drug use.  On your next visit with your primary care physician please Get Medicines reviewed and adjusted.  Please request your Prim.MD to go over all Hospital Tests and Procedure/Radiological results at the follow up, please get all Hospital records sent to your Prim MD by signing hospital release before you go home.  If you experience worsening of your admission symptoms, develop shortness of breath, life threatening emergency, suicidal or homicidal thoughts you must seek medical attention immediately by calling 911 or calling your MD immediately  if symptoms less severe.  You Must read complete instructions/literature along with all the possible adverse reactions/side effects for all the Medicines you take and that have been prescribed to you. Take any new Medicines after you have completely understood and accpet all the possible adverse reactions/side effects.          Information on my medicine - ELIQUIS (apixaban)  This medication education was reviewed with me or my healthcare representative as part of my discharge preparation.    Why was Eliquis prescribed for you? Eliquis was prescribed to treat blood clots that may have been found in the veins of your legs  (deep vein thrombosis) or in your lungs (pulmonary embolism) and to reduce the risk of them occurring again.  What do You need to know about Eliquis ? The dose is ONE 5 mg tablet taken TWICE daily.  Eliquis may be taken with or without food.   Try to take the dose about the same time in the morning and in the evening. If you have difficulty swallowing the tablet whole please discuss with your pharmacist how to take the medication safely.  Take Eliquis exactly as prescribed and DO NOT stop taking Eliquis without talking to the doctor who prescribed the medication.  Stopping may increase your risk of developing a new blood clot.  Refill your prescription before you run out.  After discharge, you should have regular check-up appointments with your healthcare provider that is prescribing your Eliquis.    What do you do if you miss a dose? If a dose of ELIQUIS is not taken at the scheduled time, take it as soon as possible on the same day and twice-daily administration should be resumed. The dose should not be doubled to make up for a missed dose.  Important Safety Information A possible side effect of Eliquis is bleeding. You should call your healthcare provider right away if you experience any of the following: Bleeding from an injury or your nose that does not stop. Unusual colored urine (red or dark brown) or unusual colored stools (red or black). Unusual bruising for unknown reasons. A serious fall or if you hit  your head (even if there is no bleeding).  Some medicines may interact with Eliquis and might increase your risk of bleeding or clotting while on Eliquis. To help avoid this, consult your healthcare provider or pharmacist prior to using any new prescription or non-prescription medications, including herbals, vitamins, non-steroidal anti-inflammatory drugs (NSAIDs) and supplements.  This website has more information on Eliquis (apixaban): http://[email or web address].com/eliquis/home

## 2021-10-20 NOTE — Plan of Care (Signed)
°  Problem: Education: Goal: Knowledge of General Education information will improve Description: Including pain rating scale, medication(s)/side effects and non-pharmacologic comfort measures Outcome: Not Progressing   Problem: Health Behavior/Discharge Planning: Goal: Ability to manage health-related needs will improve Outcome: Not Progressing   Problem: Clinical Measurements: Goal: Ability to maintain clinical measurements within normal limits will improve Outcome: Not Progressing Goal: Will remain free from infection Outcome: Not Progressing Goal: Diagnostic test results will improve Outcome: Not Progressing Goal: Respiratory complications will improve Outcome: Not Progressing Goal: Cardiovascular complication will be avoided Outcome: Not Progressing   Problem: Activity: Goal: Risk for activity intolerance will decrease Outcome: Not Progressing   Problem: Nutrition: Goal: Adequate nutrition will be maintained Outcome: Not Progressing   Problem: Coping: Goal: Level of anxiety will decrease Outcome: Not Progressing   Problem: Elimination: Goal: Will not experience complications related to bowel motility Outcome: Not Progressing Goal: Will not experience complications related to urinary retention Outcome: Not Progressing   Problem: Pain Managment: Goal: General experience of comfort will improve Outcome: Not Progressing   Problem: Safety: Goal: Ability to remain free from injury will improve Outcome: Not Progressing

## 2021-10-20 NOTE — Telephone Encounter
New Referral Intake    TO PATIENT:  Thank you for contacting us, how did  you hear about our transplant program? Neph    Is English your primary language? Yes   If no, do you or your support person need an interpreter?      (HARD STOPS)  *Do you have a support person who will be with you before, during, and after a transplant? Yes        Name and Relationship of Support Person: Daughters, Awilda Bill and [place], able to drive, local   (over 56 yoa, can/will drive & lives locally to you?)  *Would you accept a lifesaving blood transfusion? Yes (IF NO-stop!)  *Any active infections? No  (IF YES- stop!)  *Open wounds or sores? No  (IF YES- stop!)  *Do you live in a Nursing Facility? No (If YES, at what level? Independent Living, Assisted Living or Skilled/Rehab?)    *Have you had your COVID vaccination series? Yes What brand? Pfizer How many? Four shots   If YES, please send or bring a copy of your vacc card.  If NO, referral nurse will address     *Primary Insurance Company: [employer]  Policy #: [id number]   Group ID:     Secondary Insurance Company:   Policy #:   Group ID:     Who is your kidney doctor?  Dr. Philomena Course  Who is your primary doctor?  Dr. Johny Shears     Have you had a hospitalization in the past 6 months?  No If YES, ask what hospital, what date and why?     Past Medical History:  (if answer is yes, please specify)    How tall are you?  5 6  How much do you weigh? 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 No  If yes, where/when?   Have you ever been diagnosed with Diabetes?  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 Age 65 Do you see an Endocrinologist? No  (IF Type 1, BMI is 30 or below, diagnosed under 50 yoa and current age is under 60--> ask if interested in [facility])     Are you currently on dialysis?  No (If No, skip the next 4 questions)   Type/ Start date (chronic)/ Schedule/ Time:     What center:                Do you receive [employer] ([employer]) premium assistance?   If yes, how much per month?                Have you had your Hepatitis B vaccination series?     Have you been denied or listed by another transplant center? No IF denied, why?   Are you currently working with any other transplant centers to become listed? No  Have you had a previous organ transplant? No  If YES, when and where?     History of:  Heart disease?  High blood pressure   Do you have a heart doctor? No  History of heart stents?  No   Are you on medication to keep your blood thin (ex. Coumadin, Plavix, Brilinta- do not include Heparin for dialysis)? No   Are you on medication to help raise your blood pressure (  Midodrine)? No     Lung disease? Asthma, If yes, do you see a Pulmonologist? No   Are you on oxygen? No  If Yes, how many liters?  with dialysis or all the time?    Do you wear CPAP (breathing machine at night to help you sleep)? No               Do you use any marijuana products ? Yes, smoke - advised to quit and switch to edibles                  Do you use illegal substances or have you in the past? No   Do you use tobacco or nicotine products or have you in the past? No  (this includes vaping and patches)   Packs/cans per day?  for years?   When did you quit?    (TO PATIENT: If you currently use tobacco or nicotine products, you need to quit to be considered a transplant candidate.)                 Liver disease?  No                            If YES, what kind, when diagnosed?   History of HIV or Hepatitis? No    If yes, who treated and where?  Cancer? No     If yes, what kind, when diagnosed, where treated?    Psychiatric illness (Depression/Anxiety/Bipolar Disorder)?  No    Do you see a psychiatrist or counselor for any reason?  No               Do you use a cane/walker/wheelchair to get around? No    Past Surgical History:    Have you had any abdominal surgeries? Gall bladder, tubes tied   Have you had any surgeries on blood vessels (not including a fistula)? No  Have you had any amputations?  No    Demographic and Social History:  (Please do not turn these into a leading questions, ask them as they are written)  What gender were you assigned at birth (female or female)?  female    Do you still identify as a_____? female  What race do you identify with (White, Black, Hispanic, Asian, Pacific Islander)?  Black       Has anyone shown interest in being your living donor?  (Not applicable with [facility]) No   If so, they are welcome to come to evaluation with you (up to two persons)  If patient has possible living donor - have them call:  ? [place] - [phone]  ? Lelon Mast [phone]      If not already in MyChart.[name] KitchenMarland KitchenWe may need you to sign a Release of Information Authorization Form. Do you have access to a fax machine or an e-mail address we can send it to? In chart     Note to staff -   Obtain the following records:  ? nephrology note  ? hospital discharge summary within 6 month (or most recent)   ? pathology report    Notify Dr. Curt Bears if delay in scheduling due to lack of records

## 2021-10-21 LAB — CBC WITH DIFFERENTIAL/PLATELET
Abs Immature Granulocytes: 0.07 10*3/uL (ref 0.00–0.07)
Basophils Absolute: 0 10*3/uL (ref 0.0–0.1)
Basophils Relative: 0 %
Eosinophils Absolute: 0.1 10*3/uL (ref 0.0–0.5)
Eosinophils Relative: 1 %
HCT: 25.4 % — ABNORMAL LOW (ref 36.0–46.0)
Hemoglobin: 7.9 g/dL — ABNORMAL LOW (ref 12.0–15.0)
Immature Granulocytes: 1 %
Lymphocytes Relative: 5 %
Lymphs Abs: 0.5 10*3/uL — ABNORMAL LOW (ref 0.7–4.0)
MCH: 25.9 pg — ABNORMAL LOW (ref 26.0–34.0)
MCHC: 31.1 g/dL (ref 30.0–36.0)
MCV: 83.3 fL (ref 80.0–100.0)
Monocytes Absolute: 0.8 10*3/uL (ref 0.1–1.0)
Monocytes Relative: 7 %
Neutro Abs: 9.7 10*3/uL — ABNORMAL HIGH (ref 1.7–7.7)
Neutrophils Relative %: 86 %
Platelets: 139 10*3/uL — ABNORMAL LOW (ref 150–400)
RBC: 3.05 MIL/uL — ABNORMAL LOW (ref 3.87–5.11)
RDW: 17.8 % — ABNORMAL HIGH (ref 11.5–15.5)
WBC: 11.3 10*3/uL — ABNORMAL HIGH (ref 4.0–10.5)
nRBC: 0 % (ref 0.0–0.2)

## 2021-10-21 LAB — COMPREHENSIVE METABOLIC PANEL
ALT: 18 U/L (ref 0–44)
AST: 24 U/L (ref 15–41)
Albumin: <1.5 g/dL — ABNORMAL LOW (ref 3.5–5.0)
Alkaline Phosphatase: 93 U/L (ref 38–126)
Anion gap: 9 (ref 5–15)
BUN: 84 mg/dL — ABNORMAL HIGH (ref 8–23)
CO2: 24 mmol/L (ref 22–32)
Calcium: 7.8 mg/dL — ABNORMAL LOW (ref 8.9–10.3)
Chloride: 98 mmol/L (ref 98–111)
Creatinine, Ser: 4.02 mg/dL — ABNORMAL HIGH (ref 0.44–1.00)
GFR, Estimated: 12 mL/min — ABNORMAL LOW (ref >60)
Glucose, Bld: 103 mg/dL — ABNORMAL HIGH (ref 70–99)
Potassium: 4.3 mmol/L (ref 3.5–5.1)
Sodium: 131 mmol/L — ABNORMAL LOW (ref 135–145)
Total Bilirubin: 0.6 mg/dL (ref 0.3–1.2)
Total Protein: 6 g/dL — ABNORMAL LOW (ref 6.5–8.1)

## 2021-10-21 LAB — MAGNESIUM: Magnesium: 1.7 mg/dL (ref 1.7–2.4)

## 2021-10-21 LAB — BRAIN NATRIURETIC PEPTIDE: B Natriuretic Peptide: 1149.3 pg/mL — ABNORMAL HIGH (ref 0.0–100.0)

## 2021-10-21 LAB — C-REACTIVE PROTEIN: CRP: 14.8 mg/dL — ABNORMAL HIGH (ref <1.0)

## 2021-10-21 LAB — GLUCOSE, CAPILLARY
Glucose-Capillary: 80 mg/dL (ref 70–99)
Glucose-Capillary: 72 mg/dL (ref 70–99)
Glucose-Capillary: 66 mg/dL — ABNORMAL LOW (ref 70–99)
Glucose-Capillary: 137 mg/dL — ABNORMAL HIGH (ref 70–99)
Glucose-Capillary: 110 mg/dL — ABNORMAL HIGH (ref 70–99)

## 2021-10-21 MED ORDER — DOXYCYCLINE HYCLATE 100 MG PO CAPS: 100.0000 mg | ORAL_CAPSULE | Freq: Two times a day (BID) | ORAL | 0 refills | Status: DC

## 2021-10-21 MED ORDER — CEFTRIAXONE IV (FOR PTA / DISCHARGE USE ONLY): 2.0000 g | INTRAVENOUS | 0 refills | Status: DC

## 2021-10-21 MED ORDER — ONDANSETRON HCL 4 MG/2ML IJ SOLN
4.0000 mg | Freq: Once | INTRAMUSCULAR | Status: AC
  Administered 2021-10-21: 16:00:00 4 mg via INTRAVENOUS
  Filled 2021-10-21: qty 2

## 2021-10-21 MED ORDER — LACTATED RINGERS IV SOLN: INTRAVENOUS | Status: AC

## 2021-10-21 MED ORDER — METRONIDAZOLE 500 MG PO TABS: 500.0000 mg | ORAL_TABLET | Freq: Two times a day (BID) | ORAL | 0 refills | Status: AC

## 2021-10-21 NOTE — TOC Initial Note (Signed)
Transition of Care [facility]) - Initial/Assessment Note    Patient Details  Name: [name] [name] MRN: [id number] Date of Birth: 1956-09-20  [facility]) CM/SW Contact:    Sharin Mons, RN Phone Number: 10/21/2021, 2:21 PM  Clinical Narrative:                 NCM spoke with pt  and pt's husband regarding d/c planning. Pt presents with sepsis due to bilateral foot cellulitis. 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 ID following ... pt will require 6 wks IV ABX therapy. Pt agreeable to home health services. Choice provided. Pt without preference. Referral made with [facility] (ABX therapy) and [facility] for RN only.  Both accepted . Teaching done with  husband by [facility] regarding home infusion.   NCM was unable to secure a home health agency which provides more disciplines 2/2 to generic insurance, MD made aware.   Husband states can manage pt wounds. Per MD bedside nurse is to teach husband how to do drsg change and document teaching, NCM made bedside nurse aware. D/C plan set for 12/24. Pt is to f/u with PCP regarding wounds.  TOC team will continue to monitor and assist with TOC needs.  Expected Discharge Plan: [place] Barriers to Discharge: Continued Medical Work up   Patient Goals and CMS Choice        Expected Discharge Plan and Services Expected Discharge Plan: [place]   Discharge Planning Services: CM Consult                                          Prior Living Arrangements/Services     Patient language and need for interpreter reviewed:: Yes Do you feel safe going back to the place where you live?: Yes      Need for Family Participation in Patient Care: Yes (Comment) Care giver support system in place?: Yes (comment) Current home services: DME Criminal Activity/Legal Involvement Pertinent to Current Situation/Hospitalization: No - Comment as  needed  Activities of Daily Living Home Assistive Devices/Equipment: None ADL Screening (condition at time of admission) Patient's cognitive ability adequate to safely complete daily activities?: Yes Is the patient deaf or have difficulty hearing?: No Does the patient have difficulty seeing, even when wearing glasses/contacts?: Yes Does the patient have difficulty concentrating, remembering, or making decisions?: No Patient able to express need for assistance with ADLs?: Yes Does the patient have difficulty dressing or bathing?: Yes Independently performs ADLs?: No Communication: Independent Dressing (OT): Needs assistance Is this a change from baseline?: Pre-admission baseline Grooming: Needs assistance Is this a change from baseline?: Pre-admission baseline Feeding: Independent with device (comment) Bathing: Independent Toileting: Independent In/Out Bed: Independent Walks in Home: Independent Does the patient have difficulty walking or climbing stairs?: No Weakness of Legs: Both Weakness of Arms/Hands: None  Permission Sought/Granted      Share Information with NAME: Mirjana Tarleton (Spouse) [phone]           Emotional Assessment Appearance:: Appears stated age Attitude/Demeanor/Rapport: Engaged Affect (typically observed): Pleasant Orientation: : Oriented to Self, Oriented to Place, Oriented to  Time, Oriented to Situation Alcohol / Substance Use: Not Applicable Psych Involvement: No (comment)  Admission diagnosis:  Dehydration [E86.0] Weakness [R53.1] Osteomyelitis of left foot (HCC) [M86.9] Diabetic infection of left foot (HCC) [N98.921, L08.9] Diabetic infection  of right foot ([place]) [X73.532, L08.9] Closed fracture of multiple ribs of left side, initial encounter [S22.42XA] Patient Active Problem List   Diagnosis Date Noted   Bacteremia    Severe protein-calorie malnutrition ([place])    Severe sepsis ([place])    Osteomyelitis of left foot ([place]) 10/16/2021    Cellulitis of right foot 10/16/2021   Hyponatremia 10/16/2021   Hyperkalemia 10/16/2021   Acute kidney injury superimposed on CKD ([place]) 10/16/2021   Leukocytosis 10/16/2021   Diabetic foot ulcers ([place]) 10/16/2021   DM (diabetes mellitus), type 2 with renal complications ([place]) 99/24/2683   Generalized weakness 10/16/2021   Portal hypertension ([place]) 08/16/2021   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[place]) 08/16/2021   Eczema of scalp 06/27/2021   Hypothyroidism 06/27/2021   Pruritus 06/27/2021   Seasonal allergic rhinitis due to pollen 02/02/2021   CRF (chronic renal failure), stage 4 (severe) ([place]) 02/02/2021   PTSD (post-traumatic stress disorder)    Osteoarthritis    MRSA (methicillin resistant Staphylococcus aureus)    Hypertension    High cholesterol    Hepatic steatosis    GERD (gastroesophageal reflux disease)    Fibromyalgia    Fatty liver    Exertional shortness of breath    Episode of visual loss of left eye    Depression    Dehiscence of closure of skin    Constipation    Chronic kidney disease    Cataract    Bulging lumbar disc    Asthma    Anxiety    Anginal pain ([place])    Anemia    Tinea capitis 10/11/2020   Tinea corporis 10/11/2020   SOB (shortness of breath) 10/11/2020   Hip pain 05/16/2020   Diabetic infection of left foot ([place]) 05/12/2020   IDA (iron deficiency anemia) 05/04/2020   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 ([place]) 03/16/2020   S/P lumbar fusion 03/04/2020   Spinal stenosis of lumbar region 01/22/2020   Osteomyelitis of vertebra of thoracolumbar region ([place]) 01/21/2020   Back pain 11/17/2019   Compression fracture of L1 lumbar vertebra ([place]) 11/17/2019   Fracture of multiple ribs 11/17/2019   Intractable nausea and vomiting 11/16/2019   AKI (acute kidney injury) ([place]) 11/15/2019   Nausea & vomiting 11/15/2019   Diarrhea 09/04/2019   Pressure injury of skin 04/17/2019   Septic arthritis of elbow, right  ([place]) 04/17/2019   Retinopathy 04/17/2019   Renal transplant, 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   Sleep apnea 11/16/2017   Diabetic foot infection ([place]) 04/06/2017   Type 2 diabetes mellitus with hyperglycemia, with long-term current use of insulin ([place]) 04/06/2017   Ulcer of left heel, limited to breakdown of skin ([place]) 02/01/2017   Neuropathy 05/05/2015   Renal insufficiency 05/05/2015   Diabetic peripheral neuropathy ([place]) 01/12/2015   CKD (chronic kidney disease), stage III ([place]) 05/27/2014   History of MI (myocardial infarction) 10/06/2013   Nausea and vomiting 10/06/2013   Obesity (BMI 30-39.9) 09/20/2013   Multinodular goiter 04/17/2013   Normocytic anemia 02/03/2013   Proximal humerus fracture 10/15/2012   CAD (coronary artery disease) 01/11/2012   Hepatic cirrhosis ([place]) 07/06/2010   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   Hyperlipidemia 06/03/2008   Essential hypertension 12/23/2006   Acute MI ([place]) 2007   PCP:  Ann Held, DO Pharmacy:   [facility], [place] [address] Phone: [phone] Fax: [facility] #[id number] - [place], [place] -  [address] [place] [address] Phone: [phone] Fax: [phone]  [id number] - [place], [place] - [address] [place] [address] Phone: [phone] Fax: [phone]     Social Determinants of Health (SDOH) Interventions    Readmission Risk Interventions Readmission Risk Prevention Plan 01/23/2020 11/18/2019  Transportation Screening Complete Complete  Medication Review Press photographer) Complete Referral to Pharmacy  PCP or Specialist appointment within 3-5 days of discharge Complete Complete  HRI or Home Care Consult Complete Complete  SW Recovery Care/Counseling Consult Complete Complete  Palliative Care  Screening Complete Not [place] Complete Not Applicable  Some recent data might be hidden

## 2021-10-21 NOTE — Progress Notes (Signed)
PROGRESS NOTE                                                                                                                                                                                                             Patient Demographics:    [name] [name], is a 65 y.o. adult, DOB - Feb 05, 1956, YQI:[id number]  Outpatient Primary MD for the patient is Carollee Herter, Alferd Apa, DO    LOS - 5  Admit date - 10/16/2021    Chief Complaint  Patient presents with   Altered Mental Status       Brief Narrative (HPI from H&P)    [name] [name] is a 65 y.o. adult with medical history significant for CKD 4, DMT2, CAD, HTN, fibromyalgia, neuropathy of legs, chronic ulcers of feet, history of DVTs, history of renal transplant, history of cirrhosis who presents for generalized weakness that is progressively worsened gradually over the last 2 weeks prior to hospitalization in the ER she was diagnosed with severe sepsis due to bilateral foot cellulitis, AKI on CKD 4, severe dehydration with hyponatremia.  She was admitted for further care   Subjective:   Patient in bed, appears comfortable, denies any headache, no fever, no chest pain or pressure, no shortness of breath , no abdominal pain. No new focal weakness.   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  Had detailed discussion with patient's husband on 10/18/2021 and patient on 10/19/2021, again updated both on 10/20/2021, they are still holding off  on surgery despite being adequately warned, will proceed with IJ PICC line.  Strep intermedius growing from 10/16/2021 blood cultures.  Antibiotics per ID have been updated.   -2 weeks bid metronidazole until 10/31/21 -6 weeks bid doxycycline until 11/28/21 -6 weeks iv ceftriaxone 2 gram daily until 11/28/21   2.  AKI on CKD/renal transplant with severe dehydration and hyponatremia.  Function improved with IV fluids  for hydration which will be continued, discontinue Foley catheter on 10/21/2021 and monitor.  3.  Dyslipidemia.  Replaced on statin.  4.  History of DVTs was on Xarelto.  To compromise renal function will place on Eliquis right now, since no surgery is planned stop heparin drip.  5.  Anemia of chronic disease worse with heme dilution due to IV fluids.  After consent 1 unit of packed RBC placement on 10/18/2021, placed on ferrous sulfate and folic acid and monitor.    6. HTN - on Coreg monitor and adjust as needed.  7. DM type II.  On Lantus and sliding scale, dose adjusted again on 10/20/2021 for better control.   Lab Results  Component Value Date   HGBA1C 7.1 (H) 10/17/2021   CBG (last 3)  Recent Labs    10/20/21 0803 10/20/21 2053 10/21/21 0503  GLUCAP 83 204* 80         Condition - Extremely Guarded  Family Communication  : Husband [name] [phone] called 10/17/2021, 10/18/2021  Code Status :  Full  Consults  : Orthopedics, nephrology, ID  PUD Prophylaxis : PPI   Procedures  :     TTE - 1. Left ventricular ejection fraction, by estimation, is 55 to 60%. The left ventricle has normal function. The left ventricle has no regional wall motion abnormalities. There is mild concentric left ventricular hypertrophy. Left ventricular diastolic parameters were normal.  2. Right ventricular systolic function is normal. The right ventricular size is mildly enlarged. There is moderately elevated pulmonary artery systolic pressure. The estimated right ventricular  systolic pressure is 60.1 mmHg.  3. Left atrial size was mildly dilated.  4. The mitral valve is normal in structure. Trivial mitral valve regurgitation.  5. The aortic valve is grossly normal. Aortic valve regurgitation is not visualized. No aortic stenosis is present.  6. The inferior vena cava is dilated in size with <50% respiratory variability, suggesting right atrial pressure of 15 mmHg. Comparison(s): Changes from prior study are noted. RV slightly enlarged compared to prior study. Conclusion(s)/Recommendation(s): No evidence of valvular vegetations on this transthoracic echocardiogram. Consider a transesophageal echocardiogram to exclude infective endocarditis if clinically indicated.   MRI - 1. Acute osteomyelitis of the fourth metatarsal base and proximal diaphysis as well as throughout the adjacent cuboid. Fluid within the fourth TMT joint suggests septic arthritis. 2. Patchy marrow edema throughout the remaining bones of the midfoot as well as the metatarsal bases. Findings favor a component of early acute osteomyelitis. 3. Soft tissue ulcerations at the dorsolateral aspect of the forefoot overlying the level of the fourth metatarsal diaphysis. Additional deep soft tissue ulceration along the plantar aspect of the forefoot underlying the level of the fourth metatarsal base with tract extending to the underlying bone. Two small abscesses within the soft tissues near the level of the fourth TMT joint measuring up to 1.3 cm. 4. Numerous foci of susceptibility within the soft tissues in the region of the midfoot compatible with subcutaneous emphysema seen radiographically. 5. Marked edematous changes of the visualized foot musculature likely reflecting a combination of myositis and denervation.      Disposition Plan  :    Status is: Inpatient  Remains inpatient appropriate because: Sepsis  DVT Prophylaxis  : Eliquis    Lab Results  Component Value Date   PLT 139 (L) 10/21/2021    Diet :   Diet Order             DIET SOFT Room service appropriate?  Yes; Fluid consistency: Thin  Diet effective now                    Inpatient Medications  Scheduled Meds:  apixaban  5 mg Oral BID   atorvastatin  10 mg Oral Daily   carvedilol  6.25 mg Oral BID WC   Chlorhexidine Gluconate Cloth  6 each Topical Daily   cholecalciferol  400 Units Oral Daily   doxycycline  100 mg Oral Q12H   ferrous sulfate  325 mg Oral BID WC   folic acid  2 mg Oral Daily   insulin aspart  0-6 Units Subcutaneous TID WC   insulin glargine-yfgn  12 Units Subcutaneous Q2200   metroNIDAZOLE  500 mg Oral Q12H   pantoprazole  40 mg Oral Daily   Continuous Infusions:  cefTRIAXone (ROCEPHIN)  IV 2 g (10/21/21 0159)   lactated ringers 50 mL/hr at 10/21/21 0801   PRN Meds:.acetaminophen, ondansetron **OR** ondansetron (ZOFRAN) IV, senna-docusate, traMADol  Antibiotics  :    Anti-infectives (From admission, onward)    Start     Dose/Rate Route Frequency Ordered Stop   10/21/21 0000  cefTRIAXone (ROCEPHIN) IVPB        2 g Intravenous Every 24 hours 10/21/21 1123 12/01/21 2359   10/21/21 0000  doxycycline (VIBRAMYCIN) 100 MG capsule       Note to Pharmacy: Stop date 11/29/21   100 mg Oral 2 times daily 10/21/21 1123 12/01/21 2359   10/21/21 0000  metroNIDAZOLE (FLAGYL) 500 MG tablet        500 mg Oral 2 times daily 10/21/21 1123 11/03/21 2359   10/20/21 0200  cefTRIAXone (ROCEPHIN) 2 g in sodium chloride 0.9 % 100 mL IVPB        2 g 200 mL/hr over 30 Minutes Intravenous Every 24 hours 10/19/21 1401 11/28/21 2359   10/19/21 2200  metroNIDAZOLE (FLAGYL) tablet 500 mg        500 mg Oral Every 12 hours 10/19/21 1401 10/31/21 2359   10/19/21 1500  doxycycline (VIBRA-TABS) tablet 100 mg        100 mg Oral Every 12 hours 10/19/21 1401 11/28/21 2359   10/18/21 2000  DAPTOmycin (CUBICIN) 600 mg in sodium chloride 0.9 % IVPB  Status:  Discontinued        8 mg/kg  75.8 kg 124 mL/hr over 30 Minutes  Intravenous Every 48 hours 10/16/21 2245 10/19/21 1401   10/18/21 1030  metroNIDAZOLE (FLAGYL) IVPB 500 mg  Status:  Discontinued        500 mg 100 mL/hr over 60 Minutes Intravenous Every 12 hours 10/18/21 0941 10/19/21 1401   10/16/21 2330  DAPTOmycin (CUBICIN) 600 mg in sodium chloride 0.9 % IVPB        8 mg/kg  75.8 kg 124 mL/hr over 30 Minutes Intravenous  Once 10/16/21 2239 10/17/21 0358   10/16/21 2300  ceFEPIme (MAXIPIME) 1 g in sodium chloride 0.9 % 100 mL IVPB  Status:  Discontinued        1 g 200 mL/hr over 30 Minutes Intravenous Every 24 hours 10/16/21 2239 10/19/21 1401   10/16/21 2045  vancomycin (VANCOREADY) IVPB 1500 mg/300 mL  Status:  Discontinued        1,500 mg 150 mL/hr over 120 Minutes Intravenous  Once 10/16/21 2006 10/16/21 2218   10/16/21 2015  ciprofloxacin (CIPRO) IVPB 400 mg        400 mg 200 mL/hr over 60 Minutes Intravenous  Once 10/16/21 2006  10/16/21 2223   10/16/21 2015  clindamycin (CLEOCIN) IVPB 600 mg        600 mg 100 mL/hr over 30 Minutes Intravenous  Once 10/16/21 2006 10/16/21 2116        Time Spent in minutes  30   Lala Lund M.D on 10/21/2021 at 11:24 AM  To page go to [email or web address]   [facility] -  Office  [phone]  See all Orders from today for further details    Objective:   Vitals:   10/20/21 0400 10/20/21 1727 10/21/21 0500 10/21/21 0800  BP: (!) 154/61 (!) 146/70  134/60  Pulse:  70  66  Resp: 15 14  10   Temp: 97.6 F (36.4 C) 98.3 F (36.8 C)  97.9 F (36.6 C)  TempSrc: Axillary Oral  Oral  SpO2: 91% 97%  93%  Weight:   93.7 kg   Height:        Wt Readings from Last 3 Encounters:  10/21/21 93.7 kg  09/21/21 74.4 kg  07/29/21 73.5 kg     Intake/Output Summary (Last 24 hours) at 10/21/2021 1124 Last data filed at 10/21/2021 1013 Gross per 24 hour  Intake 1129.17 ml  Output 4500 ml  Net -3370.83 ml     Physical Exam  Awake Alert, No new F.N deficits, Normal affect [place].AT,PERRAL Supple  Neck, No JVD,   Symmetrical Chest wall movement, Good air movement bilaterally, CTAB RRR,No Gallops, Rubs or new Murmurs,  +ve B.Sounds, Abd Soft, No tenderness,     Both Feet are under bandage, pictures from the day of admission below Foot pictures from 10/17/2021 placed after consent       RN pressure injury documentation: Pressure Injury 04/16/19 Toe (Comment  which one) Left Deep Tissue Injury - Purple or maroon localized area of discolored intact skin or blood-filled blister due to damage of underlying soft tissue from pressure and/or shear. maroon coloring to tip of to (Active)  04/16/19 1500  Location: Toe (Comment  which one) (L great toe)  Location Orientation: Left  Staging: Deep Tissue Injury - Purple or maroon localized area of discolored intact skin or blood-filled blister due to damage of underlying soft tissue from pressure and/or shear.  Wound Description (Comments): maroon coloring to tip of toe  Present on Admission: Yes     Pressure Injury 04/16/19 Toe (Comment  which one) Anterior;Left Stage II -  Partial thickness loss of dermis presenting as a shallow open ulcer with a red, pink wound bed without slough. appears as opened blister - rubbed from cast (Active)  04/16/19 1500  Location: Toe (Comment  which one) (5th toe - pinky)  Location Orientation: Anterior;Left  Staging: Stage II -  Partial thickness loss of dermis presenting as a shallow open ulcer with a red, pink wound bed without slough.  Wound Description (Comments): appears as opened blister - rubbed from cast  Present on Admission: Yes     Data Review:    CBC Recent Labs  Lab 10/16/21 1956 10/18/21 0719 10/18/21 1830 10/19/21 0331 10/20/21 0334 10/21/21 0226  WBC 42.1* 16.1*  --  14.5* 12.5* 11.3*  HGB 8.2* 7.1* 8.0* 7.8* 8.0* 7.9*  HCT 25.6* 21.3* 24.5* 23.8* 24.8* 25.4*  PLT 215 181  --  161 158 139*  MCV 82.8 78.3*  --  81.0 81.8 83.3  MCH 26.5 26.1  --  26.5 26.4 25.9*  MCHC 32.0 33.3   --  32.8 32.3 31.1  RDW 18.6* 18.7*  --  17.6*  17.8* 17.8*  LYMPHSABS 0.0* 0.9  --  1.1 0.7 0.5*  MONOABS 1.3* 1.4*  --  1.4* 1.1* 0.8  EOSABS 0.0 0.1  --  0.1 0.1 0.1  BASOSABS 0.0 0.0  --  0.0 0.0 0.0    Electrolytes Recent Labs  Lab 10/16/21 1956 10/17/21 0617 10/17/21 0852 10/17/21 1403 10/17/21 1804 10/18/21 0719 10/19/21 0331 10/20/21 0334 10/21/21 0226  NA 120* 125*  --   --  125* 125* 126* 127* 131*  K 6.6* 5.4*  --   --  4.4 4.8 4.5 4.4 4.3  CL 93* 97*  --   --  94* 92* 93* 95* 98  CO2 15* 18*  --   --  20* 23 23 23 24   GLUCOSE 181* 153*  --   --  139* 130* 90 115* 103*  BUN 141* 135*  --   --  129* 121* 112* 97* 84*  CREATININE 8.24* 7.05*  --   --  6.55* 5.92* 5.22* 4.53* 4.02*  CALCIUM 7.8* 7.0*  --   --  7.1* 6.9* 7.2* 7.5* 7.8*  AST 65* 63*  --   --   --  48* 35 26 24  ALT 28 25  --   --   --  26 23 19 18   ALKPHOS 144* 126  --   --   --  99 101 100 93  BILITOT 0.7 1.0  --   --   --  0.9 0.9 0.5 0.6  ALBUMIN 1.9* 1.6*  --   --   --  1.7* 1.6* 1.5* <1.5*  MG  --   --  2.0  --   --  1.8 1.9 1.8 1.7  CRP  --   --   --   --   --  17.4* 17.6* 18.0* 14.8*  LATICACIDVEN 1.1  --   --   --   --   --   --   --   --   INR  --   --   --  1.4*  --   --   --   --   --   HGBA1C  --  7.1*  --   --   --   --   --   --   --   BNP  --   --   --   --   --  423.7* 410.7* 892.6* 1,149.3*    ------------------------------------------------------------------------------------------------------------------ No results for input(s): CHOL, HDL, LDLCALC, TRIG, CHOLHDL, LDLDIRECT in the last 72 hours.  Lab Results  Component Value Date   HGBA1C 7.1 (H) 10/17/2021    No results for input(s): TSH, T4TOTAL, T3FREE, THYROIDAB in the last 72 hours.  Invalid input(s): FREET3 ------------------------------------------------------------------------------------------------------------------ ID Labs Recent Labs  Lab 10/16/21 1956 10/17/21 0617 10/17/21 1804 10/18/21 0719  10/19/21 0331 10/20/21 0334 10/21/21 0226  WBC 42.1*  --   --  16.1* 14.5* 12.5* 11.3*  PLT 215  --   --  181 161 158 139*  CRP  --   --   --  17.4* 17.6* 18.0* 14.8*  LATICACIDVEN 1.1  --   --   --   --   --   --   CREATININE 8.24*   < > 6.55* 5.92* 5.22* 4.53* 4.02*   < > = values in this interval not displayed.   Cardiac Enzymes No results for input(s): CKMB, TROPONINI, MYOGLOBIN in the last 168 hours.  Invalid input(s): CK   Radiology Reports IR Fluoro  Guide CV Line Right  Result Date: 10/20/2021 Criselda Peaches, MD     10/20/2021  5:16 PM Interventional Radiology Procedure Note Procedure: Placement of right IJ dual lumen tunneled PowerLine. Cath tips in upper RA and ready for use. Complications: None Estimated Blood Loss: None Recommendations: - Routine line care Signed, Criselda Peaches, MD   IR US Guide Vasc Access Right  Result Date: 10/20/2021 Criselda Peaches, MD     10/20/2021  5:16 PM Interventional Radiology Procedure Note Procedure: Placement of right IJ dual lumen tunneled PowerLine. Cath tips in upper RA and ready for use. Complications: None Estimated Blood Loss: None Recommendations: - Routine line care Signed, Criselda Peaches, MD   ECHOCARDIOGRAM COMPLETE  Result Date: 10/18/2021    ECHOCARDIOGRAM REPORT   Patient Name:   [name] [name] Date of Exam: 10/18/2021 Medical Rec #:  [id number]         Height:       65.0 in Accession #:    [id number]        Weight:       166.2 lb Date of Birth:  11/11/1955         BSA:          1.829 m Patient Age:    7 years          BP:           127/55 mmHg Patient Gender: F                 HR:           78 bpm. Exam Location:  Inpatient Procedure: 2D Echo, Cardiac Doppler, Color Doppler and Intracardiac            Opacification Agent Indications:    Bacteremia  History:        Patient has prior history of Echocardiogram examinations, most                 recent 11/17/2020. Previous Myocardial Infarction,                  Signs/Symptoms:Bacteremia and Dyspnea; Risk                 Factors:Dyslipidemia, Diabetes, Hypertension and Sleep Apnea.  Sonographer:    Roseanna Rainbow RDCS Referring Phys: [id number] GREGORY D CALONE  Sonographer Comments: Technically difficult study due to poor echo windows and patient is morbidly obese. Image acquisition challenging due to patient body habitus. Patient states she has broken ribs. IMPRESSIONS  1. Left ventricular ejection fraction, by estimation, is 55 to 60%. The left ventricle has normal function. The left ventricle has no regional wall motion abnormalities. There is mild concentric left ventricular hypertrophy. Left ventricular diastolic parameters were normal.  2. Right ventricular systolic function is normal. The right ventricular size is mildly enlarged. There is moderately elevated pulmonary artery systolic pressure. The estimated right ventricular systolic pressure is 81.0 mmHg.  3. Left atrial size was mildly dilated.  4. The mitral valve is normal in structure. Trivial mitral valve regurgitation.  5. The aortic valve is grossly normal. Aortic valve regurgitation is not visualized. No aortic stenosis is present.  6. The inferior vena cava is dilated in size with <50% respiratory variability, suggesting right atrial pressure of 15 mmHg. Comparison(s): Changes from prior study are noted. RV slightly enlarged compared to prior study. Conclusion(s)/Recommendation(s): No evidence of valvular vegetations on this transthoracic echocardiogram. Consider a transesophageal echocardiogram to exclude infective endocarditis if clinically  indicated. FINDINGS  Left Ventricle: Left ventricular ejection fraction, by estimation, is 55 to 60%. The left ventricle has normal function. The left ventricle has no regional wall motion abnormalities. The left ventricular internal cavity size was normal in size. There is  mild concentric left ventricular hypertrophy. Left ventricular diastolic parameters were normal.  Right Ventricle: The right ventricular size is mildly enlarged. No increase in right ventricular wall thickness. Right ventricular systolic function is normal. There is moderately elevated pulmonary artery systolic pressure. The tricuspid regurgitant velocity is 2.94 m/s, and with an assumed right atrial pressure of 15 mmHg, the estimated right ventricular systolic pressure is 53.6 mmHg. Left Atrium: Left atrial size was mildly dilated. Right Atrium: Right atrial size was normal in size. Pericardium: There is no evidence of pericardial effusion. Mitral Valve: The mitral valve is normal in structure. Trivial mitral valve regurgitation. Tricuspid Valve: The tricuspid valve is normal in structure. Tricuspid valve regurgitation is mild . No evidence of tricuspid stenosis. Aortic Valve: The aortic valve is grossly normal. Aortic valve regurgitation is not visualized. No aortic stenosis is present. Pulmonic Valve: The pulmonic valve was not well visualized. Pulmonic valve regurgitation is not visualized. No evidence of pulmonic stenosis. Aorta: The aortic root, ascending aorta and aortic arch are all structurally normal, with no evidence of dilitation or obstruction. Venous: The inferior vena cava is dilated in size with less than 50% respiratory variability, suggesting right atrial pressure of 15 mmHg. IAS/Shunts: The atrial septum is grossly normal.  LEFT VENTRICLE PLAX 2D LVIDd:         4.50 cm      Diastology LVIDs:         3.10 cm      LV e' medial:    9.03 cm/s LV PW:         1.30 cm      LV E/e' medial:  10.4 LV IVS:        1.30 cm      LV e' lateral:   8.81 cm/s LVOT diam:     2.00 cm      LV E/e' lateral: 10.7 LV SV:         73 LV SV Index:   40 LVOT Area:     3.14 cm  LV Volumes (MOD) LV vol d, MOD A2C: 119.0 ml LV vol d, MOD A4C: 102.0 ml LV vol s, MOD A2C: 48.8 ml LV vol s, MOD A4C: 38.9 ml LV SV MOD A2C:     70.2 ml LV SV MOD A4C:     102.0 ml LV SV MOD BP:      65.8 ml RIGHT VENTRICLE             IVC RV S  prime:     15.40 cm/s  IVC diam: 2.50 cm TAPSE (M-mode): 2.2 cm LEFT ATRIUM             Index        RIGHT ATRIUM           Index LA diam:        4.00 cm 2.19 cm/m   RA Area:     14.00 cm LA Vol (A2C):   54.8 ml 29.97 ml/m  RA Volume:   33.70 ml  18.43 ml/m LA Vol (A4C):   47.9 ml 26.20 ml/m LA Biplane Vol: 53.7 ml 29.37 ml/m  AORTIC VALVE LVOT Vmax:   126.00 cm/s LVOT Vmean:  80.700 cm/s LVOT VTI:    0.232  m  AORTA Ao Root diam: 3.00 cm Ao Asc diam:  3.30 cm MITRAL VALVE                TRICUSPID VALVE MV Area (PHT): 2.69 cm     TR Peak grad:   34.6 mmHg MV Decel Time: 282 msec     TR Vmax:        294.00 cm/s MV E velocity: 94.30 cm/s MV A velocity: 104.00 cm/s  SHUNTS MV E/A ratio:  0.91         Systemic VTI:  0.23 m                             Systemic Diam: 2.00 cm Buford Dresser MD Electronically signed by Buford Dresser MD Signature Date/Time: 10/18/2021/5:28:19 PM    Final

## 2021-10-21 NOTE — Progress Notes (Signed)
Verbal order from Lala Lund MD to discontinue foley catheter at approximately 0800. Foley has been discontinued by this nurse.

## 2021-10-21 NOTE — Progress Notes (Addendum)
Wound and dressing change education and teaching was given to husband [name] [name] at bedside by this nurse.

## 2021-10-22 LAB — GLUCOSE, CAPILLARY
Glucose-Capillary: 89 mg/dL (ref 70–99)
Glucose-Capillary: 114 mg/dL — ABNORMAL HIGH (ref 70–99)

## 2021-10-22 MED ORDER — FERROUS SULFATE 325 (65 FE) MG PO TABS: 325.0000 mg | ORAL_TABLET | Freq: Two times a day (BID) | ORAL | 0 refills | Status: DC

## 2021-10-22 MED ORDER — APIXABAN 5 MG PO TABS: 5.0000 mg | ORAL_TABLET | Freq: Two times a day (BID) | ORAL | 0 refills | Status: DC

## 2021-10-22 MED ORDER — HEPARIN SOD (PORK) LOCK FLUSH 100 UNIT/ML IV SOLN
250.0000 [IU] | INTRAVENOUS | Status: AC | PRN
  Administered 2021-10-22: 13:00:00 250 [IU]
  Filled 2021-10-22: qty 3

## 2021-10-22 MED ORDER — NONFORMULARY OR COMPOUNDED ITEM: 0 refills | Status: DC

## 2021-10-22 MED ORDER — CARVEDILOL 6.25 MG PO TABS: 6.2500 mg | ORAL_TABLET | Freq: Two times a day (BID) | ORAL | 0 refills | Status: DC

## 2021-10-22 MED ORDER — ONDANSETRON HCL 8 MG PO TABS: 4.0000 mg | ORAL_TABLET | Freq: Three times a day (TID) | ORAL | 0 refills | Status: DC | PRN

## 2021-10-22 NOTE — Discharge Summary (Signed)
[name] [name] RXY:[id number] DOB: 16-Jun-1956 DOA: 10/16/2021  PCP: Ann Held, DO  Admit date: 10/16/2021  Discharge date: 10/22/2021  Admitted From: Home   Disposition:  Home   Recommendations for Outpatient Follow-up:   Follow up with PCP in 1-2 weeks  PCP Please obtain BMP/CBC, 2 view CXR in 1week,  (see Discharge instructions)   PCP Please follow up on the following pending results: Check CBC, BMP in 3 to 4 days post discharge.  Monitor lower extremity wounds closely.   Home Health: PT, RN   Equipment/Devices: As below Consultations: ID, orthopedics, Reggie Discharge Condition: Guarded CODE STATUS: Full  Diet Recommendation: Heart Healthy low carbohydrate    Chief Complaint  Patient presents with   Altered Mental Status     Brief history of present illness from the day of admission and additional interim summary     [name] [name] is a 65 y.o. adult with medical history significant for CKD 4, DMT2, CAD, HTN, fibromyalgia, neuropathy of legs, chronic ulcers of feet, history of DVTs, history of renal transplant, history of cirrhosis who presents for generalized weakness that is progressively worsened gradually over the last 2 weeks prior to hospitalization in the ER she was diagnosed with severe sepsis due to bilateral foot cellulitis, AKI on CKD 4, severe dehydration with hyponatremia.  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  Had detailed discussion with patient's husband on 10/18/2021 and patient on 10/19/2021, again updated both on 10/20/2021, they are still holding off on surgery despite being adequately warned, final discussion again on 10/22/2021 and again refused, PICC line placed.  Will be discharged home on  below dictated antibiotic regimen with outpatient ID and orthopedics follow-up along with PCP follow-up.  Prognosis is very poor she has been clearly told that she can end up dying because of this infection getting out of hand and much worse, she says she accepts the responsibility of her actions, husband also in agreement with patient's plan.     -2 weeks bid metronidazole until 10/31/21 -6 weeks bid doxycycline until 11/28/21 -6 weeks iv ceftriaxone 2 gram daily until 11/28/21     2.  AKI on CKD/renal transplant with severe dehydration and hyponatremia.  Function improved with IV fluids for hydration which will be continued, discontinue Foley catheter on 10/21/2021 , renal function now close to baseline follow with PCP.   3.  Dyslipidemia.  Replaced on statin.   4.  History of DVTs was on Xarelto.  Due to compromised renal function placed on Eliquis and statin now.   5.  Anemia of chronic disease worse with heme dilution due to IV fluids.  After consent 1 unit of packed RBC placement on 10/18/2021, placed on ferrous sulfate and folic acid and monitor.     6. HTN -has been placed on Coreg monitor and adjust as needed.   7. DM type II.  To new home regimen.  Check CBGs q. ACH S at home.      Discharge diagnosis     Principal Problem:   Osteomyelitis of left foot (HCC) Active Problems:   Essential hypertension   Cellulitis of right foot   Hyponatremia   Hyperkalemia   Acute kidney injury superimposed on CKD  (HCC)   Leukocytosis   Diabetic foot ulcers (HCC)   DM (diabetes mellitus), type 2 with renal complications [facility])   Generalized weakness   Bacteremia    Discharge instructions    Discharge Instructions     [facility] pharmacist to adjust dose for Vancomycin, Aminoglycosides and other anti-infective therapies as requested by physician.   Complete by: As directed    [facility] to provide Cath Flo 76m   Complete by: As directed    Administer for PICC line occlusion and as ordered by physician for other access device issues.   Anaphylaxis Kit: Provided to treat any anaphylactic reaction to the medication being provided to the patient if First Dose or when requested by physician   Complete by: As directed    Epinephrine 130mml vial / amp: Administer 0.26m30m0.26ml44mubcutaneously once for moderate to severe anaphylaxis, nurse to call physician and pharmacy when reaction occurs and call 911 if needed for immediate care   Diphenhydramine 50mg19mIV vial: Administer 25-50mg 22mM PRN for first dose reaction, rash, itching, mild reaction, nurse to call physician and pharmacy when reaction occurs   Sodium Chloride 0.9% NS 500ml [id number] if needed for hypovolemic blood pressure drop or as ordered by physician after call to physician with anaphylactic reaction   Change dressing on IV access line weekly and PRN   Complete by: As directed    Discharge instructions   Complete by: As directed    Follow with Primary MD Lowne CCarollee Hertere Alferd Apa 7 days   Get CBC, CMP, 2 view Chest X ray -  checked next visit within 1 week by Primary MD    Activity: As tolerated with Full fall precautions use walker/cane & assistance as needed  Disposition Home     Diet: Heart Healthy Low Carb  Accuchecks 4 times/day, Once in AM empty stomach and then before  each meal. Log in all results and show them to your Prim.MD in 3 days. If any glucose reading is under 80 or above 300 call  your Prim MD immidiately. Follow Low glucose instructions for glucose under 80 as instructed.   Special Instructions: If you have smoked or chewed Tobacco  in the last 2 yrs please stop smoking, stop any regular Alcohol  and or any Recreational drug use.  On your next visit with your primary care physician please Get Medicines reviewed and adjusted.  Please request your Prim.MD to go over all Hospital Tests and Procedure/Radiological results at the follow up, please get all Hospital records sent to your Prim MD by signing hospital release before you go home.  If you experience worsening of your admission symptoms, develop shortness of breath, life threatening emergency, suicidal or homicidal thoughts you must seek medical attention immediately by calling 911 or calling your MD immediately  if symptoms less severe.  You Must read complete instructions/literature along with all the possible adverse reactions/side effects for all the Medicines you take and that have been prescribed to you. Take any new Medicines after you have completely understood and accpet all the possible adverse reactions/side effects.   Discharge wound care:   Complete by: As directed    Wound care to bilateral foot wound:  Cleanse with Normal Saline, pat dry. Cover with dry dressings (ABD pads) and secure with Kerlix roll gauze/paper tape. Change twice daily and PRN drainage strike-through or dressing dislodgement.   Flush IV access with Sodium Chloride 0.9% and Heparin 10 units/ml or 100 units/ml   Complete by: As directed    Home infusion instructions - [facility]   Complete by: As directed    Instructions: Flush IV access with Sodium Chloride 0.9% and Heparin 10units/ml or 100units/ml   Change dressing on IV access line: Weekly and PRN   Instructions Cath Flo 27m: Administer for PICC Line occlusion and as ordered by physician for other access device   [facility] pharmacist to adjust dose for:  Vancomycin, Aminoglycosides and other anti-infective therapies as requested by physician   Increase activity slowly   Complete by: As directed    Method of administration may be changed at the discretion of home infusion pharmacist based upon assessment of the patient and/or caregivers ability to self-administer the medication ordered   Complete by: As directed        Discharge Medications   Allergies as of 10/22/2021       Reactions   Bee Pollen Anaphylaxis   Fish-derived Products Hives, Shortness Of Breath, Swelling, Rash   Hives get in throat causing trouble breathing   Mushroom Extract Complex Anaphylaxis   Penicillins Anaphylaxis   **Tolerated cefepime March 2021 Did it involve swelling of the face/tongue/throat, SOB, or low BP? Yes Did it involve sudden or severe rash/hives, skin peeling, or any reaction on the inside of your mouth or nose? No Did you need to seek medical attention at a hospital or doctor's office? Yes When did it last happen?  A few months ago If all above answers are "NO", may proceed with cephalosporin use.   Rosemary Oil Anaphylaxis   Shellfish Allergy Hives, Shortness Of Breath, Swelling, Rash   Tomato Hives, Shortness Of Breath   Hives in throat causes her trouble breathing   Acetaminophen Other (See Comments)   GI upset   Aloe Vera Hives   Broccoli [brassica Oleracea] Hives   Acyclovir And Related Other (See Comments)  Unknown reaction   Naproxen Other (See Comments)   Unknown reaction        Medication List     TAKE these medications    acetaminophen 500 MG tablet Commonly known as: TYLENOL Take 1,000 mg by mouth every 6 (six) hours as needed for headache.   apixaban 5 MG Tabs tablet Commonly known as: ELIQUIS Take 1 tablet (5 mg total) by mouth 2 (two) times daily.   atorvastatin 10 MG tablet Commonly known as: LIPITOR Take 1 tablet by mouth once daily   carvedilol 6.25 MG tablet Commonly known as: COREG Take 1 tablet (6.25  mg total) by mouth 2 (two) times daily with a meal.   cefTRIAXone  IVPB Commonly known as: ROCEPHIN Inject 2 g into the vein daily. Indication:  B/L Foot Osteo First Dose: Yes Last Day of Therapy:  11/29/21 Labs - Once weekly:  CBC/D and BMP, Labs - Every other week:  ESR and CRP Method of administration: IV Push Method of administration may be changed at the discretion of home infusion pharmacist based upon assessment of the patient and/or caregiver's ability to self-administer the medication ordered.   diazepam 5 MG tablet Commonly known as: VALIUM Take 1 tablet (5 mg total) by mouth every 12 (twelve) hours as needed for anxiety.   doxycycline 100 MG capsule Commonly known as: Vibramycin Take 1 capsule (100 mg total) by mouth 2 (two) times daily.   DULoxetine 60 MG capsule Commonly known as: CYMBALTA Take 1 capsule by mouth once daily   ferrous sulfate 325 (65 FE) MG tablet Take 1 tablet (325 mg total) by mouth 2 (two) times daily with a meal.   folic acid 1 MG tablet Commonly known as: FOLVITE Take 2 tablets by mouth once daily   FreeStyle Libre 14 Day Reader Kerrin Mo 1 Device by Does not apply route daily.   FreeStyle Libre 14 Day Sensor Misc 1 Device by Does not apply route daily.   furosemide 20 MG tablet Commonly known as: LASIX Take 1 tablet (20 mg total) by mouth daily. What changed: how much to take   HYDROcodone-acetaminophen 5-325 MG tablet Commonly known as: NORCO/VICODIN Take 1 tablet by mouth every 6 (six) hours as needed for moderate pain.   Lantus SoloStar 100 UNIT/ML Solostar Pen Generic drug: insulin glargine INJECT 20 UNITS SUBCUTANEOUSLY IN THE MORNING AND 40 AT NIGHT What changed:  how much to take when to take this additional instructions   metroNIDAZOLE 500 MG tablet Commonly known as: Flagyl Take 1 tablet (500 mg total) by mouth 2 (two) times daily for 13 days. Stop date 11/01/21   montelukast 10 MG tablet Commonly known as: SINGULAIR TAKE  1 TABLET BY MOUTH AT BEDTIME   multivitamin with minerals Tabs tablet Take 1 tablet by mouth daily.   NONFORMULARY OR COMPOUNDED ITEM Please dispense 1 L border of normal saline, ABD pads, Kerlix roll, 30 gauze pieces, 1 roll of paper tape for twice a day following dressing change, gently clean lower extremity wounds with Normal Saline, pat dry. Cover with dry dressings (ABD pads) and secure with Kerlix roll gauze/paper tape   ondansetron 8 MG tablet Commonly known as: Zofran Take 0.5 tablets (4 mg total) by mouth every 8 (eight) hours as needed for nausea or vomiting. What changed: how much to take   pregabalin 75 MG capsule Commonly known as: LYRICA TAKE 1 CAPSULE BY MOUTH THREE TIMES DAILY What changed: when to take this   VITAMIN D PO Take  2 capsules by mouth daily.               Discharge Care Instructions  (From admission, onward)           Start     Ordered   10/22/21 0000  Discharge wound care:       Comments: Wound care to bilateral foot wound:  Cleanse with Normal Saline, pat dry. Cover with dry dressings (ABD pads) and secure with Kerlix roll gauze/paper tape. Change twice daily and PRN drainage strike-through or dressing dislodgement.   10/22/21 1225   10/21/21 0000  Change dressing on IV access line weekly and PRN  (Home infusion instructions - [facility] )        10/21/21 1123             Follow-up Information     Vu, Rockey Situ, MD Follow up.   Specialty: Infectious Diseases Why: 11/09/21 at 3:30 pm. Please call if you are not able to make this appointment. Contact information: [address] [place] [address] [phone]         Ann Held, DO. Schedule an appointment as soon as possible for a visit in 1 week(s).   Specialty: Family Medicine Contact information: [address] [place] [address] [phone]         [name], MD. Schedule an appointment as soon as  possible for a visit in 1 week(s).   Specialty: Orthopedic Surgery Contact information: [address] [place] [address] [phone]                 Major procedures and Radiology Reports - PLEASE review detailed and final reports thoroughly  -      DG Chest 1 View  Result Date: 10/16/2021 CLINICAL DATA:  Weakness, known left rib fractures EXAM: CHEST  1 VIEW COMPARISON:  None. FINDINGS: Lungs are well expanded, symmetric, and clear. No pneumothorax or pleural effusion. Cardiac size within normal limits. Pulmonary vascularity is normal. Osseous structures are age-appropriate. Known left ninth and tenth rib fractures is not well visualized on this examination. Healed left clavicular mid-diaphyseal fracture noted. IMPRESSION: No active disease. Known left ninth and tenth rib fractures are not well visualized on this exam. Electronically Signed   By: Fidela Salisbury M.D.   On: 10/16/2021 19:52   CT Head Wo Contrast  Result Date: 10/16/2021 CLINICAL DATA:  Increasing weakness over the past week with subsequent fall, initial encounter EXAM: CT HEAD WITHOUT CONTRAST CT CERVICAL SPINE WITHOUT CONTRAST TECHNIQUE: Multidetector CT imaging of the head and cervical spine was performed following the standard protocol without intravenous contrast. Multiplanar CT image reconstructions of the cervical spine were also generated. COMPARISON:  02/13/2020 FINDINGS: CT HEAD FINDINGS Brain: No evidence of acute infarction, hemorrhage, hydrocephalus, extra-axial collection or mass lesion/mass effect. Vascular: No hyperdense vessel or unexpected calcification. Skull: Normal. Negative for fracture or focal lesion. Sinuses/Orbits: No acute finding. Other: None. CT CERVICAL SPINE FINDINGS Alignment: Within normal limits. Skull base and vertebrae: 7 cervical segments are well visualized. Vertebral body height is well maintained. Disc space narrowing with osteophytic changes are noted at C5-6. Mild facet  hypertrophic changes are noted. No acute fracture or acute facet abnormality is noted. Soft tissues and spinal canal: No prevertebral fluid or swelling. No visible canal hematoma. Upper chest: Visualized lung apices are within normal limits Other: None IMPRESSION: CT of the head: No acute intracranial abnormality noted. CT  of the cervical spine: Mild degenerative changes without acute abnormality. Electronically Signed   By: Inez Catalina M.D.   On: 10/16/2021 19:58   CT Cervical Spine Wo Contrast  Result Date: 10/16/2021 CLINICAL DATA:  Increasing weakness over the past week with subsequent fall, initial encounter EXAM: CT HEAD WITHOUT CONTRAST CT CERVICAL SPINE WITHOUT CONTRAST TECHNIQUE: Multidetector CT imaging of the head and cervical spine was performed following the standard protocol without intravenous contrast. Multiplanar CT image reconstructions of the cervical spine were also generated. COMPARISON:  02/13/2020 FINDINGS: CT HEAD FINDINGS Brain: No evidence of acute infarction, hemorrhage, hydrocephalus, extra-axial collection or mass lesion/mass effect. Vascular: No hyperdense vessel or unexpected calcification. Skull: Normal. Negative for fracture or focal lesion. Sinuses/Orbits: No acute finding. Other: None. CT CERVICAL SPINE FINDINGS Alignment: Within normal limits. Skull base and vertebrae: 7 cervical segments are well visualized. Vertebral body height is well maintained. Disc space narrowing with osteophytic changes are noted at C5-6. Mild facet hypertrophic changes are noted. No acute fracture or acute facet abnormality is noted. Soft tissues and spinal canal: No prevertebral fluid or swelling. No visible canal hematoma. Upper chest: Visualized lung apices are within normal limits Other: None IMPRESSION: CT of the head: No acute intracranial abnormality noted. CT of the cervical spine: Mild degenerative changes without acute abnormality. Electronically Signed   By: Inez Catalina M.D.   On:  10/16/2021 19:58   MR FOOT RIGHT WO CONTRAST  Result Date: 10/17/2021 CLINICAL DATA:  Diabetic foot ulcer EXAM: MRI OF THE RIGHT FOREFOOT WITHOUT CONTRAST TECHNIQUE: Multiplanar, multisequence MR imaging of the right forefoot was performed. No intravenous contrast was administered. COMPARISON:  X-ray 10/16/2021 FINDINGS: Bones/Joint/Cartilage Postsurgical changes of prior fifth ray resection. Extensive bone marrow edema with intermediate-low T1 signal changes at the fourth metatarsal base and proximal diaphysis as well as throughout the adjacent cuboid, compatible with acute osteomyelitis. Fluid within the fourth TMT joint space suggests septic arthritis. Patchy marrow edema throughout the remaining bones of the midfoot including the cuneiform bones and lateral portion of the navicular bone. Similar patchy marrow edema involves the bases of the first, second, and third metatarsals. There are areas of intermediate T1 signal intensity within these bones which favor a component of early acute osteomyelitis. No acute fracture or dislocation. Ligaments Lisfranc ligament is poorly defined. Collateral ligaments of the MTP joints are intact. Muscles and Tendons Marked edematous changes of the visualized foot musculature likely reflecting a combination of myositis and denervation. No focal tenosynovial fluid collection. Soft tissues Soft tissue ulceration at the dorso lateral aspect of the forefoot overlying the level of the fourth metatarsal diaphysis. Additional deep soft tissue ulceration along the plantar aspect of the forefoot underlying the level of the fourth metatarsal base with tract extending to the underlying bone. Two small fluid collections within the soft tissues near the level of the fourth TMT joint measuring up to 1.3 cm. Diffuse circumferential soft tissue edema. Numerous foci of susceptibility within the soft tissues in the region of the midfoot compatible with subcutaneous emphysema seen  radiographically. IMPRESSION: 1. Acute osteomyelitis of the fourth metatarsal base and proximal diaphysis as well as throughout the adjacent cuboid. Fluid within the fourth TMT joint suggests septic arthritis. 2. Patchy marrow edema throughout the remaining bones of the midfoot as well as the metatarsal bases. Findings favor a component of early acute osteomyelitis. 3. Soft tissue ulcerations at the dorsolateral aspect of the forefoot overlying the level of the fourth metatarsal diaphysis. Additional deep soft  tissue ulceration along the plantar aspect of the forefoot underlying the level of the fourth metatarsal base with tract extending to the underlying bone. Two small abscesses within the soft tissues near the level of the fourth TMT joint measuring up to 1.3 cm. 4. Numerous foci of susceptibility within the soft tissues in the region of the midfoot compatible with subcutaneous emphysema seen radiographically. 5. Marked edematous changes of the visualized foot musculature likely reflecting a combination of myositis and denervation. Electronically Signed   By: Davina Poke D.O.   On: 10/17/2021 08:20   IR Fluoro Guide CV Line Right  Result Date: 10/20/2021 Criselda Peaches, MD     10/20/2021  5:16 PM Interventional Radiology Procedure Note Procedure: Placement of right IJ dual lumen tunneled PowerLine. Cath tips in upper RA and ready for use. Complications: None Estimated Blood Loss: None Recommendations: - Routine line care Signed, Criselda Peaches, MD   IR US Guide Vasc Access Right  Result Date: 10/20/2021 Criselda Peaches, MD     10/20/2021  5:16 PM Interventional Radiology Procedure Note Procedure: Placement of right IJ dual lumen tunneled PowerLine. Cath tips in upper RA and ready for use. Complications: None Estimated Blood Loss: None Recommendations: - Routine line care Signed, Criselda Peaches, MD   DG Foot Complete Left  Result Date: 10/16/2021 CLINICAL DATA:  Bilateral  foot wound EXAM: LEFT FOOT - COMPLETE 3+ VIEW COMPARISON:  None. FINDINGS: Posttraumatic or postsurgical change involving the left calcaneus is again noted with truncation of the posterior calcaneus. No acute fracture or dislocation. Large soft tissue ulcer is seen subjacent to the calcaneus, increased in size since prior examination. There is extensive soft tissue swelling of the left hindfoot. There has developed subtle periosteal reaction and sclerosis involving the inferior aspect of the calcaneus which may reflect changes of a chronic osteomyelitis in this location. IMPRESSION: Enlarging, large soft tissue ulcer involving the plantar aspect of the left hindfoot with subjacent sclerosis and subtle periosteal reaction involving the calcaneus suspicious for changes of chronic osteomyelitis in this location. Extensive surrounding soft tissue swelling. Electronically Signed   By: Fidela Salisbury M.D.   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 There has developed a large soft tissue ulceration involving the lateral, plantar aspect of the right midfoot subjacent to the a cuboid. No associated osseous erosion to suggest osteomyelitis. There is extensive soft tissue swelling of the right mid and forefoot. Subcutaneous edema noted within the right ankle. There are multiple foci of gas noted within the subcutaneous soft tissues of the right forefoot dorsally and midfoot medially, possibly related to changes of direct trauma or aggressive infection. No acute fracture or dislocation. Moderate plantar and superior calcaneal spurs are noted. IMPRESSION: Soft tissue ulcer involving the lateral, plantar right midfoot. Extensive soft tissue swelling of the right foot and ankle. Subcutaneous gas within the right forefoot and midfoot remote from the  ulcer may reflect changes of direct trauma or aggressive infection. Electronically Signed   By: Fidela Salisbury M.D.   On: 10/16/2021 19:55   ECHOCARDIOGRAM COMPLETE  Result Date: 10/18/2021    ECHOCARDIOGRAM REPORT   Patient Name:   [name] [name] Date of Exam: 10/18/2021 Medical Rec #:  [id number]         Height:  65.0 in Accession #:    [id number]        Weight:       166.2 lb Date of Birth:  1956/03/31         BSA:          1.829 m Patient Age:    81 years          BP:           127/55 mmHg Patient Gender: F                 HR:           78 bpm. Exam Location:  Inpatient Procedure: 2D Echo, Cardiac Doppler, Color Doppler and Intracardiac            Opacification Agent Indications:    Bacteremia  History:        Patient has prior history of Echocardiogram examinations, most                 recent 11/17/2020. Previous Myocardial Infarction,                 Signs/Symptoms:Bacteremia and Dyspnea; Risk                 Factors:Dyslipidemia, Diabetes, Hypertension and Sleep Apnea.  Sonographer:    Roseanna Rainbow RDCS Referring Phys: [id number] GREGORY D CALONE  Sonographer Comments: Technically difficult study due to poor echo windows and patient is morbidly obese. Image acquisition challenging due to patient body habitus. Patient states she has broken ribs. IMPRESSIONS  1. Left ventricular ejection fraction, by estimation, is 55 to 60%. The left ventricle has normal function. The left ventricle has no regional wall motion abnormalities. There is mild concentric left ventricular hypertrophy. Left ventricular diastolic parameters were normal.  2. Right ventricular systolic function is normal. The right ventricular size is mildly enlarged. There is moderately elevated pulmonary artery systolic pressure. The estimated right ventricular systolic pressure is 50.0 mmHg.  3. Left atrial size was mildly dilated.  4. The mitral valve is normal in structure. Trivial mitral valve regurgitation.  5. The aortic valve is grossly  normal. Aortic valve regurgitation is not visualized. No aortic stenosis is present.  6. The inferior vena cava is dilated in size with <50% respiratory variability, suggesting right atrial pressure of 15 mmHg. Comparison(s): Changes from prior study are noted. RV slightly enlarged compared to prior study. Conclusion(s)/Recommendation(s): No evidence of valvular vegetations on this transthoracic echocardiogram. Consider a transesophageal echocardiogram to exclude infective endocarditis if clinically indicated. FINDINGS  Left Ventricle: Left ventricular ejection fraction, by estimation, is 55 to 60%. The left ventricle has normal function. The left ventricle has no regional wall motion abnormalities. The left ventricular internal cavity size was normal in size. There is  mild concentric left ventricular hypertrophy. Left ventricular diastolic parameters were normal. Right Ventricle: The right ventricular size is mildly enlarged. No increase in right ventricular wall thickness. Right ventricular systolic function is normal. There is moderately elevated pulmonary artery systolic pressure. The tricuspid regurgitant velocity is 2.94 m/s, and with an assumed right atrial pressure of 15 mmHg, the estimated right ventricular systolic pressure is 93.8 mmHg. Left Atrium: Left atrial size was mildly dilated. Right Atrium: Right atrial size was normal in size. Pericardium: There is no evidence of pericardial effusion. Mitral Valve: The mitral valve is normal in structure. Trivial mitral valve regurgitation. Tricuspid Valve: The tricuspid valve is normal in structure. Tricuspid valve regurgitation is mild . No evidence of  tricuspid stenosis. Aortic Valve: The aortic valve is grossly normal. Aortic valve regurgitation is not visualized. No aortic stenosis is present. Pulmonic Valve: The pulmonic valve was not well visualized. Pulmonic valve regurgitation is not visualized. No evidence of pulmonic stenosis. Aorta: The aortic root,  ascending aorta and aortic arch are all structurally normal, with no evidence of dilitation or obstruction. Venous: The inferior vena cava is dilated in size with less than 50% respiratory variability, suggesting right atrial pressure of 15 mmHg. IAS/Shunts: The atrial septum is grossly normal.  LEFT VENTRICLE PLAX 2D LVIDd:         4.50 cm      Diastology LVIDs:         3.10 cm      LV e' medial:    9.03 cm/s LV PW:         1.30 cm      LV E/e' medial:  10.4 LV IVS:        1.30 cm      LV e' lateral:   8.81 cm/s LVOT diam:     2.00 cm      LV E/e' lateral: 10.7 LV SV:         73 LV SV Index:   40 LVOT Area:     3.14 cm  LV Volumes (MOD) LV vol d, MOD A2C: 119.0 ml LV vol d, MOD A4C: 102.0 ml LV vol s, MOD A2C: 48.8 ml LV vol s, MOD A4C: 38.9 ml LV SV MOD A2C:     70.2 ml LV SV MOD A4C:     102.0 ml LV SV MOD BP:      65.8 ml RIGHT VENTRICLE             IVC RV S prime:     15.40 cm/s  IVC diam: 2.50 cm TAPSE (M-mode): 2.2 cm LEFT ATRIUM             Index        RIGHT ATRIUM           Index LA diam:        4.00 cm 2.19 cm/m   RA Area:     14.00 cm LA Vol (A2C):   54.8 ml 29.97 ml/m  RA Volume:   33.70 ml  18.43 ml/m LA Vol (A4C):   47.9 ml 26.20 ml/m LA Biplane Vol: 53.7 ml 29.37 ml/m  AORTIC VALVE LVOT Vmax:   126.00 cm/s LVOT Vmean:  80.700 cm/s LVOT VTI:    0.232 m  AORTA Ao Root diam: 3.00 cm Ao Asc diam:  3.30 cm MITRAL VALVE                TRICUSPID VALVE MV Area (PHT): 2.69 cm     TR Peak grad:   34.6 mmHg MV Decel Time: 282 msec     TR Vmax:        294.00 cm/s MV E velocity: 94.30 cm/s MV A velocity: 104.00 cm/s  SHUNTS MV E/A ratio:  0.91         Systemic VTI:  0.23 m                             Systemic Diam: 2.00 cm Buford Dresser MD Electronically signed by Buford Dresser MD Signature Date/Time: 10/18/2021/5:28:19 PM    Final    CT CHEST ABDOMEN PELVIS WO CONTRAST  Result Date: 10/16/2021 CLINICAL DATA:  Altered mental status,  fall, found down, abdominal distension. Abdominal  pain, acute, nonlocalized Abdominal trauma, blunt. EXAM: CT CHEST, ABDOMEN AND PELVIS WITHOUT CONTRAST TECHNIQUE: Multidetector CT imaging of the chest, abdomen and pelvis was performed following the standard protocol without IV contrast. COMPARISON:  08/23/2021 FINDINGS: CT CHEST FINDINGS Cardiovascular: Extensive multi-vessel coronary artery calcification. Global cardiac size within normal limits. No pericardial effusion. Central pulmonary arteries are of normal caliber. Mild atherosclerotic calcification within the thoracic aorta. No aortic aneurysm. Mediastinum/Nodes: No enlarged mediastinal, hilar, or axillary lymph nodes. Thyroid gland, trachea, and esophagus demonstrate no significant findings. Lungs/Pleura: Mild bibasilar atelectasis. Minimal left basilar pleural thickening superficial to acute left posterior rib fractures. The lungs are otherwise clear. No pneumothorax or pleural effusion. Central airways are widely patent. Musculoskeletal: There are acute mildly displaced fractures of the left ninth and tenth ribs posteriorly. CT ABDOMEN PELVIS FINDINGS Hepatobiliary: The liver contour is nodular in keeping with changes of cirrhosis. 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 No definite solid intrahepatic mass identified. No intrahepatic biliary ductal dilation. There is mild extrahepatic biliary ductal dilation, progressive since prior examination, with the extrahepatic bile duct measuring up to 10 mm in diameter. Pancreas: Unremarkable Spleen: The spleen is mildly enlarged measuring 14.1 cm in greatest dimension, stable since prior examination. No intrasplenic masses are seen. Adrenals/Urinary Tract: Adrenal glands are unremarkable. Kidneys are normal, without renal calculi, focal lesion, or hydronephrosis. Bladder is unremarkable. Stomach/Bowel: Stomach is within normal limits. Appendix appears normal. No evidence of bowel wall  thickening, distention, or inflammatory changes. Vascular/Lymphatic: Aortic atherosclerosis. No enlarged abdominal or pelvic lymph nodes. Reproductive: Status post hysterectomy. No adnexal masses. Other: Tiny fat containing umbilical hernia.  No ascites. Musculoskeletal: L1 corpectomy and T11-L3 posterior lumbar fusion with instrumentation has been performed. No definite bridging callus or incorporation of interbody bone graft is identified along the fused segment. Additionally, there is periprosthetic lucency involving the pedicle screws of T11, L2, and L3 as well as fracture of the left pedicle screw of T11 and migration of the proximal aspects of the T11 pedicle screws into the T10-11 disc space. Together, the findings suggest motion along the fused segment. No acute fracture identified. IMPRESSION: Acute mildly displaced fractures of the left ninth and tenth ribs posteriorly. No pneumothorax. Extensive multi-vessel coronary artery calcification. Cirrhosis. 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 L1 corpectomy and T11-L3 posterior lumbar fusion procedure with findings suggesting motion along the fused segment as noted above. This may be better assessed with dedicated CT imaging, if indicated. Aortic Atherosclerosis (ICD10-I70.0). Electronically Signed   By: Fidela Salisbury M.D.   On: 10/16/2021 19:50      Today   Subjective    [name] [name] today has no headache,no chest abdominal pain,no new weakness tingling or numbness, feels much better wants to go home today.     Objective   Blood pressure (!) 154/70, pulse (!) 59, temperature 98 F (36.7 C), temperature source Oral, resp. rate 17, height 5' 5" (1.651 m), weight 93.7 kg, SpO2 95 %.   Intake/Output Summary (Last 24 hours) at 10/22/2021 1225 Last data filed at 10/21/2021 1300 Gross per 24 hour  Intake 200 ml  Output --  Net 200 ml    Exam  Awake Alert, No new F.N deficits, Normal  affect [place].AT,PERRAL Supple Neck,No JVD, No cervical lymphadenopathy appriciated.  Symmetrical Chest wall movement, Good air movement bilaterally, CTAB RRR,No Gallops,Rubs or new Murmurs, No Parasternal Heave +ve B.Sounds,  Abd Soft, Non tender, No organomegaly appriciated, No rebound -guarding or rigidity. No Cyanosis, both feet under bandage   Data Review   CBC w Diff:  Lab Results  Component Value Date   WBC 11.3 (H) 10/21/2021   HGB 7.9 (L) 10/21/2021   HGB 9.3 (L) 09/21/2021   HGB 15.0 11/30/2008   HCT 25.4 (L) 10/21/2021   HCT 43.8 11/30/2008   PLT 139 (L) 10/21/2021   PLT 149 (L) 09/21/2021   PLT 133 (L) 11/30/2008   LYMPHOPCT 5 10/21/2021   LYMPHOPCT 23.3 11/30/2008   MONOPCT 7 10/21/2021   MONOPCT 5.1 11/30/2008   EOSPCT 1 10/21/2021   EOSPCT 1.1 11/30/2008   BASOPCT 0 10/21/2021   BASOPCT 0.3 11/30/2008    CMP:  Lab Results  Component Value Date   NA 131 (L) 10/21/2021   K 4.3 10/21/2021   CL 98 10/21/2021   CO2 24 10/21/2021   BUN 84 (H) 10/21/2021   CREATININE 4.02 (H) 10/21/2021   CREATININE 3.60 (HH) 09/21/2021   CREATININE 1.97 (H) 06/24/2020   PROT 6.0 (L) 10/21/2021   ALBUMIN <1.5 (L) 10/21/2021   BILITOT 0.6 10/21/2021   BILITOT 0.3 09/21/2021   ALKPHOS 93 10/21/2021   AST 24 10/21/2021   AST 14 (L) 09/21/2021   ALT 18 10/21/2021   ALT 9 09/21/2021  .   Total Time in preparing paper work, data evaluation and todays exam - 43 minutes  Lala Lund M.D on 10/22/2021 at 12:25 PM  [facility]

## 2021-10-22 NOTE — Progress Notes (Signed)
Medications reviewed with list of what was given today Already has [facility] and CNA services set up with antibiotics for PICC arriving today at their home. Spouse states he feels competent with infusing antibiotics and has done this before. He is familiar with dressing changes. He observed dressings being changed today and verbalizes he can continue this in the home Reviewed the symptoms he must be watching for in the home which were signs of sepsis and worsening infection Spouse and patient verbalize complete understanding of discharge orders.  Patient and spouse have refused surgical intervention and skilled nursing placement.

## 2021-10-22 NOTE — Plan of Care (Signed)
°  Problem: Education: Goal: Knowledge of General Education information will improve Description: Including pain rating scale, medication(s)/side effects and non-pharmacologic comfort measures Outcome: Adequate for Discharge   Problem: Health Behavior/Discharge Planning: Goal: Ability to manage health-related needs will improve Outcome: Adequate for Discharge   Problem: Clinical Measurements: Goal: Ability to maintain clinical measurements within normal limits will improve Outcome: Adequate for Discharge Goal: Will remain free from infection Outcome: Adequate for Discharge Goal: Diagnostic test results will improve Outcome: Adequate for Discharge Goal: Respiratory complications will improve Outcome: Adequate for Discharge Goal: Cardiovascular complication will be avoided Outcome: Adequate for Discharge   Problem: Clinical Measurements: Goal: Will remain free from infection Outcome: Adequate for Discharge   Problem: Clinical Measurements: Goal: Respiratory complications will improve Outcome: Adequate for Discharge   Problem: Clinical Measurements: Goal: Cardiovascular complication will be avoided Outcome: Adequate for Discharge   Problem: Activity: Goal: Risk for activity intolerance will decrease Outcome: Adequate for Discharge   Problem: Pain Managment: Goal: General experience of comfort will improve Outcome: Adequate for Discharge   Problem: Skin Integrity: Goal: Risk for impaired skin integrity will decrease Outcome: Adequate for Discharge

## 2021-10-22 NOTE — Evaluation (Signed)
Physical Therapy Evaluation Patient Details Name: [name] [name] MRN: [id number] DOB: 09-18-1956 Today's Date: 10/22/2021  History of Present Illness  Pt is a 65 y.o. female admitted 10/16/21 with generalized weakness. Workup for sepsis due to bilateral foot cellulitis with R foot abscess and L foot ulcer. Pt declining recommendation for bilateral BKA. PMH includes CKD4, DM2, CAD, HTN, fibromyalgia, neuropathy, chronic feet ulcers, DVTs, renal transplant, cirrhosis.   Clinical Impression  Pt presents with an overall decrease in functional mobility secondary to above. PTA, pt requires assist +1 for wheelchair-level transfers and majority of ADL tasks; pt lives with husband who works during day, reports also having assist from nurse and neighbor (pt becoming frustrated when asked more details about PLOF; pt reports, "They help me with whatever I need, ok?"). Today, pt requiring modA for bed mobility, becoming tearful when encouraged to perform tasks as independently as possible, stating, "I can't, I need help.").  Pt ultimately declining attempts at OOB mobility. Pt would benefit from continued acute PT services to maximize functional mobility and independence prior to return home.   At end of session, pt reports, "I just want them to give me something stronger for this pain, let me be comfortable, then let me die." Based on pt's commentary and decreased desire to participate/regain independence, expect poor outcomes from a mobility standpoint if pt to proceed with bilateral BKA sx   Recommendations for follow up therapy are one component of a multi-disciplinary discharge planning process, led by the attending physician.  Recommendations may be updated based on patient status, additional functional criteria and insurance authorization.  Follow Up Recommendations Home health PT (pt reports not interested in rehab facility; suspect she would need post-acute rehab if she pursues bilateral BKA sx)     Assistance Recommended at Discharge Frequent or constant Supervision/Assistance  Functional Status Assessment Patient has had a recent decline in their functional status and demonstrates the ability to make significant improvements in function in a reasonable and predictable amount of time.  Equipment Recommendations   (TBD)    Recommendations for Other Services       Precautions / Restrictions Precautions Precautions: Fall;Other (comment) Precaution Comments: bilateral foot wounds wrapped in gauze      Mobility  Bed Mobility Overal bed mobility: Needs Assistance Bed Mobility: Supine to Sit     Supine to sit: Mod assist;HOB elevated     General bed mobility comments: max encouragement to attempt to perform independently, pt reports she has to have help due to back pain; cued to use bed rail, modA for trunk elevation from sidelying    Transfers                   General transfer comment: pt initially declining standing attempts or transfer to recliner; then states she needs to have BM and would like assist to pivot to [facility]; pt then becoming frustrated and ultimately declining; request to remain sitting EOB (RN in agreement)    Ambulation/Gait                  Stairs            Wheelchair Mobility    Modified Rankin (Stroke Patients Only)       Balance Overall balance assessment: Needs assistance   Sitting balance-Leahy Scale: Fair Sitting balance - Comments: pt starting to cry when asked to don socks herself  Pertinent Vitals/Pain Pain Assessment: Faces Faces Pain Scale: Hurts little more Pain Location: back Pain Descriptors / Indicators: Discomfort;Grimacing Pain Intervention(s): Monitored during session;Limited activity within patient's tolerance    Home Living Family/patient expects to be discharged to:: Private residence Living Arrangements: Spouse/significant other Available Help at  Discharge: Family;Personal care attendant;Neighbor;Available PRN/intermittently Type of Home: Apartment Home Access: Level entry       Home Layout: One level Home Equipment: Conservation officer, nature (2 wheels);Electric scooter;Shower seat Additional Comments: Husband works 3:30A-12P, home for lunch, then 1P-3:30P. When husband gone, "I have a nurse who helps me... I don't know when they're there, they're just there whenever I need them." Pt also reports neighbor helps "whenever I need"    Prior Function Prior Level of Function : Needs assist       Physical Assist : Mobility (physical);ADLs (physical)     Mobility Comments: Pt reports assist needed and available for all aspects of mobility; pt inconsistent with report of how she transfers, at one point saying she uses slide board, then states she scoots or uses walker; pt later states, "I don't know how to get it through y'alls head that I do not stand on my feet even though I use a walker" ADLs Comments: Pt reports sometime bird bathing, sometime getting into accessible shower, assist from husband for bathing. Pt reports typically independent with dressing. Requires assist for toileting; reports she may start wearing Depends     Hand Dominance        Extremity/Trunk Assessment   Upper Extremity Assessment Upper Extremity Assessment: Generalized weakness    Lower Extremity Assessment Lower Extremity Assessment: Generalized weakness (bilateral foot wounds wrapped in gauze)    Cervical / Trunk Assessment Cervical / Trunk Assessment: Other exceptions Cervical / Trunk Exceptions: pt reports chronic pain from h/o back sx  Communication      Cognition Arousal/Alertness: Awake/alert Behavior During Therapy: WFL for tasks assessed/performed;Anxious Overall Cognitive Status: No family/caregiver present to determine baseline cognitive functioning Area of Impairment: Attention;Following commands;Safety/judgement;Awareness;Problem solving                    Current Attention Level: Selective   Following Commands: Follows one step commands consistently Safety/Judgement: Decreased awareness of safety;Decreased awareness of deficits Awareness: Emergent Problem Solving: Requires verbal cues General Comments: pt very focused on having to talk to husband (assist pt to call him at beginning of session); pt seemingly becoming frustrated when encouraged to perform tasks as independently as possible, pt reports, "I can't... I'm too overwhelmed... I can't see, you have to do it for me." Pt does not appear motivated to attempt tasks as indep as possible. fluctuating between pleasant and tearful/frustrated. pt reports wanting to get on [facility] for BM, then states, "I don't like therapy because they're pushy" referring to this writer setting up [facility] for plan to transfer per pt request. Difficult to reason with pt regarding importance of OOB mobility        General Comments General comments (skin integrity, edema, etc.): SpO2 >/94% on RA    Exercises     Assessment/Plan    PT Assessment Patient needs continued PT services  PT Problem List Decreased strength;Decreased activity tolerance;Decreased balance;Decreased mobility;Decreased cognition;Decreased knowledge of use of DME;Decreased safety awareness;Pain       PT Treatment Interventions DME instruction;Gait training;Functional mobility training;Therapeutic activities;Therapeutic exercise;Balance training;Cognitive remediation;Patient/family education;Wheelchair mobility training    PT Goals (Current goals can be found in the Care Plan section)  Acute Rehab PT Goals Patient  Stated Goal: "I'm overwhelmed... I don't want to do this.[name] KitchenMarland Kitchen I want to go home, let me die" - when asked, pt ultimately deciding she still wants to work with acute PT while admitted PT Goal Formulation: With patient Time For Goal Achievement: 11/05/21 Potential to Achieve Goals: Fair    Frequency Min 3X/week    Barriers to discharge        Co-evaluation               AM-PAC PT "6 Clicks" Mobility  Outcome Measure Help needed turning from your back to your side while in a flat bed without using bedrails?: A Lot Help needed moving from lying on your back to sitting on the side of a flat bed without using bedrails?: A Lot Help needed moving to and from a bed to a chair (including a wheelchair)?: A Lot Help needed standing up from a chair using your arms (e.g., wheelchair or bedside chair)?: A Lot Help needed to walk in hospital room?: A Lot Help needed climbing 3-5 steps with a railing? : Total 6 Click Score: 11    End of Session   Activity Tolerance: Other (comment) (pt limited by cognition/desire to participate) Patient left: in bed;with call bell/phone within reach;with bed alarm set Nurse Communication: Mobility status;Other (comment) (RN ok with pt to stay sitting EOB) PT Visit Diagnosis: Other abnormalities of gait and mobility (R26.89);Pain    Time: [id number] PT Time Calculation (min) (ACUTE ONLY): 22 min   Charges:   PT Evaluation $PT Eval Moderate Complexity: [place], PT, DPT Acute Rehabilitation Services  Pager [phone] Office [place] 10/22/2021, 10:57 AM

## 2021-10-23 ENCOUNTER — Encounter: Admit: 2021-10-23 | Discharge: 2021-10-23 | Payer: MEDICARE

## 2021-10-23 LAB — CULTURE, BLOOD (ROUTINE X 2)
Culture: NO GROWTH
Culture: NO GROWTH
Special Requests: ADEQUATE

## 2021-10-25 ENCOUNTER — Telehealth: Payer: Self-pay

## 2021-10-25 NOTE — Telephone Encounter (Signed)
Transition Care Management Unsuccessful Follow-up Telephone Call  Date of discharge and from where:  10/22/2021-[place]  Attempts:  1st Attempt  Reason for unsuccessful TCM follow-up call:  No answer/busy

## 2021-10-26 ENCOUNTER — Encounter: Payer: Self-pay | Admitting: Family

## 2021-10-26 ENCOUNTER — Telehealth: Payer: Self-pay | Admitting: Orthopedic Surgery

## 2021-10-26 NOTE — Telephone Encounter (Signed)
I spoke with pt, she says she is doing very well. No more fever in her feet. Left wound is closing up. No more pain. Able to walk a little more (to bathroom by herself). She has home health nursing which was at her home at the time of the call. She said that she cannot come in at all this week for a follow up, as her husband is back at work filling in for someone. She would like for [name] to call her on Monday to get her in office early next week.

## 2021-10-27 ENCOUNTER — Encounter: Admit: 2021-10-27 | Discharge: 2021-10-27 | Payer: MEDICARE

## 2021-10-27 NOTE — Telephone Encounter (Signed)
Transition Care Management Unsuccessful Follow-up Telephone Call  Date of discharge and from where:  10/22/2021-[place]  Attempts:  2nd Attempt  Reason for unsuccessful TCM follow-up call:  Unable to leave message

## 2021-10-28 ENCOUNTER — Encounter: Admit: 2021-10-28 | Discharge: 2021-10-28 | Payer: MEDICARE

## 2021-10-28 NOTE — Telephone Encounter
Reviewed records in [place]. Noted elevated eGFR. TC to patient re: denial. V/u  Denial letter e-faxed to providers and to patient via [place].

## 2021-11-01 ENCOUNTER — Other Ambulatory Visit: Payer: PRIVATE HEALTH INSURANCE

## 2021-11-01 ENCOUNTER — Ambulatory Visit: Payer: PRIVATE HEALTH INSURANCE

## 2021-11-02 NOTE — Telephone Encounter (Signed)
Called pt this morning to check on her and get her in this week for a f/u. Pt didn't answer and VM is full.

## 2021-11-03 ENCOUNTER — Encounter: Admit: 2021-11-03 | Discharge: 2021-11-03 | Payer: MEDICARE

## 2021-11-03 MED ORDER — FLUCONAZOLE 150 MG PO TAB
150 mg | ORAL_TABLET | Freq: Once | ORAL | 1 refills | 3.00000 days | Status: AC
Start: 2021-11-03 — End: ?

## 2021-11-03 NOTE — Telephone Encounter
I sent a prescription for Diflucan to her [facility] in [place].

## 2021-11-03 NOTE — Telephone Encounter
LM on VM for pt.

## 2021-11-04 ENCOUNTER — Other Ambulatory Visit: Payer: Self-pay | Admitting: Family Medicine

## 2021-11-04 ENCOUNTER — Ambulatory Visit: Admit: 2021-11-04 | Discharge: 2021-11-05 | Payer: MEDICARE

## 2021-11-04 ENCOUNTER — Encounter: Admit: 2021-11-04 | Discharge: 2021-11-04 | Payer: MEDICARE

## 2021-11-04 DIAGNOSIS — Z981 Arthrodesis status: Secondary | ICD-10-CM

## 2021-11-04 DIAGNOSIS — G894 Chronic pain syndrome: Secondary | ICD-10-CM

## 2021-11-04 DIAGNOSIS — M8610 Other acute osteomyelitis, unspecified site: Secondary | ICD-10-CM

## 2021-11-04 DIAGNOSIS — M79642 Pain in left hand: Secondary | ICD-10-CM

## 2021-11-04 DIAGNOSIS — E785 Hyperlipidemia, unspecified: Secondary | ICD-10-CM

## 2021-11-04 DIAGNOSIS — E559 Vitamin D deficiency, unspecified: Secondary | ICD-10-CM

## 2021-11-04 DIAGNOSIS — N1832 Stage 3b chronic kidney disease (HCC): Secondary | ICD-10-CM

## 2021-11-04 DIAGNOSIS — G5600 Carpal tunnel syndrome, unspecified upper limb: Secondary | ICD-10-CM

## 2021-11-04 DIAGNOSIS — G9050 Complex regional pain syndrome I, unspecified: Secondary | ICD-10-CM

## 2021-11-04 DIAGNOSIS — J45909 Unspecified asthma, uncomplicated: Secondary | ICD-10-CM

## 2021-11-04 DIAGNOSIS — K2970 Gastritis, unspecified, without bleeding: Secondary | ICD-10-CM

## 2021-11-04 DIAGNOSIS — I10 Essential (primary) hypertension: Secondary | ICD-10-CM

## 2021-11-04 DIAGNOSIS — J309 Allergic rhinitis, unspecified: Secondary | ICD-10-CM

## 2021-11-04 DIAGNOSIS — D649 Anemia, unspecified: Secondary | ICD-10-CM

## 2021-11-04 DIAGNOSIS — K589 Irritable bowel syndrome without diarrhea: Secondary | ICD-10-CM

## 2021-11-04 DIAGNOSIS — E119 Type 2 diabetes mellitus without complications: Secondary | ICD-10-CM

## 2021-11-04 DIAGNOSIS — K5900 Constipation, unspecified: Secondary | ICD-10-CM

## 2021-11-04 MED ORDER — HYDROCODONE-ACETAMINOPHEN 5-325 MG PO TABS: 1.0000 | ORAL_TABLET | Freq: Four times a day (QID) | ORAL | 0 refills | Status: DC | PRN

## 2021-11-04 NOTE — Telephone Encounter (Signed)
RequestingLebron [name] Contract: 2019 UDS: 2019 Last OV: 08/16/2021 Next OV: N/A Last Refill: 10/04/2021, #120--0 RF Database:   Please advise

## 2021-11-04 NOTE — Telephone Encounter (Signed)
Medication: HYDROcodone-acetaminophen (NORCO/VICODIN) 5-325 MG tablet  Has the patient contacted their pharmacy? No.   Preferred Pharmacy: [place], [place]  [address] [place] [address]  Phone:  [phone]  Fax:  [phone]

## 2021-11-04 NOTE — Progress Notes
?   CKD Education NP Visit  ?  ?  ?  ?  ?  11/04/21  Visit start time: 0748  Visit end time:  0900  ?  ?  Rhonda Fletcher  [id number]  1956-01-01   ?  Obtained patient's verbal consent to treat them and their agreement to [facility] financial policy and NPP via this telehealth visit during the [employer] [facility]  ?  ?  CC:  CKD Stage 3-4, here for CKD education  ?  ?  ?  Dear Dr. Philomena Course,  ?  I had the pleasure of seeing Rhonda Fletcher via Tax [employer] today for CKD Education.  Thank you for the referral of this patient.   ?  ?  History of Present Illness  ?  Rhonda Fletcher is a 66 y.o. 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  ?  Subjective:  Pt. reports feeling well overall.  She has been going to the gym and working up her time on the treadmill. She is up to 54 minutes for 3+ times each week.  Her blood sugars are under better control.  She reports that her BPs are 160-170s/90s.  She is taking the coreg, but completely stopped the clonidine as she was getting terrible hot flashes with taking both meds. Blood sugars are variable and sometimes low on the days she works out.  The patient reports to be able to obtain and take medications as prescribed.  She reports itching at times.  ?  ?  Comprehensive ROS:  General:  As above.  HEENT:  Negative  Chest/CVS: Negative  ABD: No nausea, vomiting, diarrhea, constipation, or abdominal pain.  Denies any changes in taste of food.  No metallic taste in the mouth.  No loss of appetite.  GU: Negative  EXT: Negative  M/S: Negative  SKIN: Negative  CNS: Negative  Hematology: Negative    ?  A review of allergies, PMH, and current medications to follow  ?        Allergies   Allergen Reactions   ? Cozaar [Losartan] UNKNOWN   ? Crestor [Rosuvastatin] DIZZINESS   ? Hydrochlorothiazide NAUSEA AND VOMITING   ? Ibuprofen SEE COMMENTS   ? ? Causes asthma exacerbation   ? Lipitor [Atorvastatin] MUSCLE PAIN   ? Lisinopril COUGH   ? Mevacor [Lovastatin] MUSCLE PAIN   ? Pcn [Penicillins] UNKNOWN   ? Spironolactone NAUSEA AND VOMITING   ? Zocor [Simvastatin] MUSCLE PAIN   ?  ?       Medical History:   Diagnosis Date   ? Allergic rhinitis, cause unspecified ?   ? Anemia, unspecified ?   ? Carpal tunnel syndrome ?   ? Hand pain, left ?   ? Helicobacter pylori gastritis 02/2007   ? IBS (irritable bowel syndrome) ?   ? Other and unspecified hyperlipidemia ?   ? Reflex sympathetic dystrophy 05/2002   ? by pain management   ? Type II or unspecified type diabetes mellitus without mention of complication, not stated as uncontrolled ?   ? Unspecified asthma(493.90) ?   ? Unspecified constipation ?   ? Unspecified essential hypertension ?   ? Unspecified vitamin D deficiency ?   ?  ?        Surgical History:   Procedure Laterality Date   ? COLONOSCOPY ? 10/30/2006   ? HX CHOLECYSTECTOMY ? 2015   ? BREAST REDUCTION ? ?   ?  bilateral   ? CARPAL TUNNEL RELEASE ? left 07/12/2001, right08/09/2001   ? FOOT SURGERY ? ?   ? bilateral, Dr. Sedalia Muta   ? TUBAL LIGATION ? ?   ?  ?        Family History   Problem Relation Age of Onset   ? Other Mother ?   ?     muscular dystrophy   ? Diabetes Father ?   ? Other Brother ?   ?     liver disease   ? Diabetes Brother ?   ? Cancer Maternal Grandmother ?   ?     colon   ?  ?  Social History   ?        Socioeconomic History   ? Marital status: Single   Tobacco Use   ? Smoking status: Never   ? Smokeless tobacco: Never   Substance and Sexual Activity   ? Alcohol use: Yes   ? ? Comment: occasionaly   ? Drug use: No      ?  Objective:      ?  Laboratory studies: We reviewed most recent lab results dated 10/19/21 and ranging back to 02/11/2014.  We discussed the importance of looking at trends.  As of the most recent labs on 10/19/21 creatinine is 1.55 mg/dL with a eGFR of 37 mL/min.  According to MDRD, this is stage 3B of chronic kidney disease.  At this level of kidney function and potential for further decline, pt. is likely to need intervention in the future.  She would also like to be able to maintain this level for as long as possible.  Review of her labs shows that she has been in Cr. range of 1.5 - 2.1 for the most part 04/2019 - 12/2020 before she jumped up to 2.49 and 2.69 mg/dL.  7/22 - 06/2021.  She is now returned to her baseline on most recent labs.    ?  Physical Exam:  Vitals:  BP check by pt. during visit 174/101.  HR 81.  She took coreg this AM, but no clonidine.  General: A+Ox3, calm, conversant, and pleasant.  In no acute distress.  HEENT: Normocephalic, atraumatic.  No scleral icterus.    Neck:  Normal ROM  Lungs: Speaking in full sentences without difficulty.  Neuro:  No confusion, answers questions appropriately.  No tremors  M/S:  Visibly moving upper extremities.  Psych: Mood stable with normal affect.    Remainder of exam deferred r/t telehealth visit during COVID19    ?  Current Outpatient Medications:   ?  ACCU-CHEK GUIDE TEST STRIPS test strip, USE AS DIRECTED TO TEST BLOOD SUGAR TWICE DAILY BEFORE MEALS. (Patient taking differently: Checking roughly once weekly), Disp: 200 strip, Rfl: 1  ?  albuterol sulfate (PROAIR HFA) 90 mcg/actuation HFA aerosol inhaler, Inhale two puffs by mouth into the lungs every 6 hours as needed., Disp: 6.7 g, Rfl: 3  ?  amLODIPine (NORVASC) 10 mg tablet, TAKE 1 TABLET EVERY DAY, Disp: 90 tablet, Rfl: 3  ?  betamethasone valerate (VALISONE) 0.1 % topical cream, APPLY TOPICALLY TO AFFECTED AREA DAILY, Disp: 45 g, Rfl: 1  ?  Blood Glucose Control, Normal soln, Use in glucose meter, Accu-check Aviva, Disp: 90 each, Rfl: 3  ?  Blood-Glucose Meter kit, Use as directed to check blood sugars daily before breakfast DX E11.29, Disp: 1 kit, Rfl: 0  ?  carvediloL (COREG) 12.5 mg tablet, Take one tablet by mouth twice daily  with meals. Take with food., Disp: 120 tablet, Rfl: 3  ?  cetirizine (ZYRTEC) 10 mg tablet, Take 10 mg by mouth daily.  , Disp: , Rfl:   ?  cholecalciferol(+) (VITAMIN D-3) 2,000 unit tablet, Take 1 Tab by mouth daily., Disp: , Rfl:   ?  cloNIDine HCL (CATAPRES) 0.1 mg tablet, Take 0.1 mg by mouth as Needed., Disp: , Rfl:   ?  cloNIDine HCL (CATAPRES) 0.2 mg tablet, TAKE 1 TABLET THREE TIMES DAILY (INCREASING DOSE), Disp: 270 tablet, Rfl: 3  ?  cyanocobalamin (VITAMIN B-12) 500 mcg tablet, Take 500 mcg by mouth daily., Disp: , Rfl:   ?  cyclobenzaprine (FLEXERIL) 5 mg tablet, TAKE 1 TABLET AT BEDTIME, Disp: 90 tablet, Rfl: 3  ?  DROPSAFE ALCOHOL PREP PADS, USE TO CLEAN SKIN BEFORE FINGERSTICK, Disp: 100 each, Rfl: 1  ?  famotidine (PEPCID) 20 mg tablet, TAKE 1 TABLET TWICE DAILY, Disp: 180 tablet, Rfl: 2  ?  ferrous sulfate (FEOSOL) 325 mg (65 mg iron) tablet, Take 325 mg by mouth every 48 hours., Disp: , Rfl:   ?  fluticasone propionate (FLONASE) 50 mcg/actuation nasal spray, suspension, USE 2 SPRAYS IN EACH NOSTRIL AS DIRECTED DAILY. SHAKE BOTTLE GENTLY BEFORE USING, Disp: 48 g, Rfl: 3  ?  glimepiride (AMARYL) 2 mg tablet, TAKE 1 TABLET EVERY DAY, Disp: 90 tablet, Rfl: 1  ?  lancets MISC, Use one each as directed daily before breakfast. Diag: E11.29, Disp: 300 each, Rfl: 3  ?  pantoprazole DR (PROTONIX) 40 mg tablet, TAKE 1 TABLET EVERY DAY, Disp: 90 tablet, Rfl: 1  ?  pioglitazone (ACTOS) 15 mg tablet, Take one tablet by mouth daily., Disp: 90 tablet, Rfl: 3  ?  ?  ?  Assessment and Plan:  ?  Impression:  CKD Stage 3-4 likely r/t DM and HTN  ?  Plan:  80 minute visit with greater than 90% of time spent counseling and education as follows.  ?  Education:  ? We had a lengthy discussion regarding statistics related to CKD, causes of CKD, how the kidneys work, reasons why they stop working, and what happens when they fail.  ? We discussed important measures to take in order to slow the progression of CKD, including how to manage health conditions such as high blood pressure, heart disease, HLD, and/or diabetes to help maintain current kidney function along with the importance of exercise.  ? We discussed diet choices (depending on labs and current stage of CKD). A pre-dialysis diet is low in protein, phosphorus, sodium, and potassium.  In contrast, a hemodialysis diet requires more protein than predialysis and adequate calories to maintain weight, a low potassium, phosphorus, and sodium diet remain.  A low fluid diet (depending on UOP) is started.    ? We reviewed a low sodium diet with a goal of < 2000 mg sodium daily.  Handout:  NKDEP How to read a food label, Tips for people with Chronic Kidney Disease (CKD) sent to pt.  ? Discussed importance of maintaining adequate hydration and to notify you or go to the ER for evaluation if he is losing hydration through vomiting or diarrhea.    ? Medication review with what to take, how they work, importance of keeping a current med list at all times, what to avoid.  Discussed medications and herbal supplements that are initiated by another healthcare provider or self for (OTC medicines) should be reviewed with pharmacist and/or nephrology team to be sure that it  is not harmful to the kidneys.  Handout Avoiding NSAIDs and other potential nephrotoxic agents sent to pt.  ? Discussion of signs and symptoms of kidney failure (uremia), management of symptoms, and when to contact the nephrology team. Handout:  NKF: About Chronic Kidney Disease:  A Guide for Patients sent to pt.  ? Discussed importance of frequent follow up when nearing or in CKD V.   Pt. is to keep follow up visit with you and to call sooner if new symptoms develop.    ? Discussed renal replacement options and what to expect if kidney function declines:    ? Discussed in detail the advantages and disadvantages of kidney transplant (living donor and deceased donor), hemodialysis (in-center and at home), and peritoneal dialysis.  We also discussed the right to choose not to get a kidney transplant or dialysis and what support is available in that instance.    ? Discussed access options for hemodialysis and peritoneal dialysis.  Discussed need for a vascular access for hemodialysis.    ? Discussed members of the nephrology team (patient, family, nephrologist, nurse practitioner, surgeon, nurse, dietician, social worker, and technician).  Also discussed that pt. should keep PCP informed of any new s/s that may arise.  ? Review of test results and trends and what each result means was discussed.  Specifically we discussed anemia in the setting of CKD.  ESA and or possible need for IV iron infusions in the future if the iron stores do not remain at goal.  ? We discussed calcium/phosphorus, and the role of vitamin D.     ? Discussed that patients must decide what works for them and their family as what works for each patient, how it affects family and social life, work and finances, and patients mental health all vary from person to person. Handout:  NKF:  Coping  Managing Your Kidney Disease was given to pt.  ?     Rhonda Fletcher was active in the discussion of her lab trends, asking questions to gain knowledge, and taking handouts for further review.  She was also interested in the online kidney school link for access to further review.  [email or web address]    ?  ?  Rhonda Fletcher was able to verbalize the following:         1.  Blood pressure should be checked daily and recorded.  The goal is to maintain stable levels at or below 130/80.         2.  Specific diet changes to lower blood pressure including low sodium diet.           3.  That a vascular access is a special vessel created by the surgeon for nurses/technicians to use for hemodialysis and that this takes up to 12 weeks to heal and mature before it can be accessed.  That a peritoneal dialysis catheter is placed in the abdomen by the surgeon and is not used for immediate dialysis.  Ms. also verbalized that a hemodialysis catheter [facility] CVC) is placed for emergent start dialysis if there is no mature access ready for use at the time dialysis start is needed.   Ms. understands the risks of infection with these are high and that Ms. would like to avoid them.          4.  When kidneys fail, the body can fill with extra fluid and waste productions and this is a condition called uremia.  This can be a slow process  and noticeable to others around daily before noticeable to patient.         5.  Able to verbalize current stage of CKD.         6.  Able to verbalize importance of looking at all medication labels and avoiding NSAIDs and other medications need to be checked to see if they are kidney friendly.         7.  Ms. is interested most in pursuing ways to stay off of dialysis as long as possible.  She understands that she needs improved BP control and to keep exercising and making healthy decisions.  She thinks she would prefer a home option when the time comes that she needs dialysis.    HTN:  Her BP was elevated at time of our visit.  I had her take Clonidine 0.1 mg that she had on hand at home.  She is to recheck BP and notify us of the reading in 2 hours.  I encouraged her to check BP at home daily 2 hours after taking her AM meds.  If the SBP is > 150 to go ahead and take 0.1 mg clonidine and record all of these findings to review with you at her next apt.  She is agreeable.  ?  ?    Inda Coke, APRN, NP-C, [facility]  Nephrology Nurse Practitioner  ?  ?  CC: Dr. Philomena Course  CC: Shela Leff

## 2021-11-04 NOTE — Telephone Encounter (Signed)
Tried to call pt no answer and mail box is full will hold and continue to try and reach pt.

## 2021-11-07 NOTE — Telephone Encounter (Signed)
Multiple attempts to reach pt for hospital follow up unsuccessful trying to schedule appt. Will sign off on this message at the time. Dr aware.

## 2021-11-08 ENCOUNTER — Ambulatory Visit: Payer: PRIVATE HEALTH INSURANCE | Admitting: Family

## 2021-11-09 ENCOUNTER — Ambulatory Visit (INDEPENDENT_AMBULATORY_CARE_PROVIDER_SITE_OTHER): Payer: PRIVATE HEALTH INSURANCE | Admitting: Internal Medicine

## 2021-11-09 ENCOUNTER — Other Ambulatory Visit: Payer: Self-pay

## 2021-11-09 ENCOUNTER — Telehealth: Payer: Self-pay

## 2021-11-09 VITALS — Temp 98.4°F

## 2021-11-09 DIAGNOSIS — L02619 Cutaneous abscess of unspecified foot: Secondary | ICD-10-CM

## 2021-11-09 DIAGNOSIS — E11628 Type 2 diabetes mellitus with other skin complications: Secondary | ICD-10-CM

## 2021-11-09 DIAGNOSIS — L03119 Cellulitis of unspecified part of limb: Secondary | ICD-10-CM

## 2021-11-09 DIAGNOSIS — D649 Anemia, unspecified: Secondary | ICD-10-CM

## 2021-11-09 DIAGNOSIS — M869 Osteomyelitis, unspecified: Secondary | ICD-10-CM

## 2021-11-09 DIAGNOSIS — L089 Local infection of the skin and subcutaneous tissue, unspecified: Secondary | ICD-10-CM

## 2021-11-09 DIAGNOSIS — R7881 Bacteremia: Secondary | ICD-10-CM | POA: Diagnosis not present

## 2021-11-09 DIAGNOSIS — L97403 Non-pressure chronic ulcer of unspecified heel and midfoot with necrosis of muscle: Secondary | ICD-10-CM

## 2021-11-09 NOTE — Telephone Encounter (Signed)
CRITICAL VALUE STICKER  CRITICAL VALUE: Hgb 6.5 and RBC 2.52 (collected on 11/08/21)  RECEIVER (on-site recipient of call): Tonita Cong, RN  [place] NOTIFIED: 11/09/21 @ 1434  MESSENGER (representative from lab): Jeani Hawking, pharmacist with [facility]   MD NOTIFIED: Prudencio Pair   TIME OF NOTIFICATION: 11/09/21 @ 1435  RESPONSE:    Patient has appointment with provider today at 1530. Per Dr. Gale Journey, repeat labs can be collected then. RN called patient at 1439 to assess for symptoms, no answer and voicemail box full.  Beryle Flock, RN

## 2021-11-09 NOTE — Patient Instructions (Signed)
See you hematologist/nephrolgist within 1 week to check on blood count and restart infusion   See me end of this month or 1st week next month for follow up   Continue oral doxycycline and iv ceftriaxone  I have placed referal for podiatrist as well

## 2021-11-09 NOTE — Progress Notes (Signed)
[facility]  Patient Active Problem List   Diagnosis Date Noted   Bacteremia    Severe protein-calorie malnutrition ([place])    Severe sepsis (HCC)    Osteomyelitis of left foot ([place]) 10/16/2021   Cellulitis of right foot 10/16/2021   Hyponatremia 10/16/2021   Hyperkalemia 10/16/2021   Acute kidney injury superimposed on CKD ([place]) 10/16/2021   Leukocytosis 10/16/2021   Diabetic foot ulcers ([place]) 10/16/2021   DM (diabetes mellitus), type 2 with renal complications ([place]) 17/79/3903   Generalized weakness 10/16/2021   Portal hypertension ([place]) 08/16/2021   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[place]) 08/16/2021   Eczema of scalp 06/27/2021   Hypothyroidism 06/27/2021   Pruritus 06/27/2021   Seasonal allergic rhinitis due to pollen 02/02/2021   CRF (chronic renal failure), stage 4 (severe) ([place]) 02/02/2021   PTSD (post-traumatic stress disorder)    Osteoarthritis    MRSA (methicillin resistant Staphylococcus aureus)    Hypertension    High cholesterol    Hepatic steatosis    GERD (gastroesophageal reflux disease)    Fibromyalgia    Fatty liver    Exertional shortness of breath    Episode of visual loss of left eye    Depression    Dehiscence of closure of skin    Constipation    Chronic kidney disease    Cataract    Bulging lumbar disc    Asthma    Anxiety    Anginal pain ([place])    Anemia    Tinea capitis 10/11/2020   Tinea corporis 10/11/2020   SOB (shortness of breath) 10/11/2020   Hip pain 05/16/2020   Diabetic infection of left foot ([place]) 05/12/2020   IDA (iron deficiency anemia) 05/04/2020   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 ([place]) 03/16/2020   S/P lumbar fusion 03/04/2020   Spinal stenosis of lumbar region 01/22/2020   Osteomyelitis of vertebra of thoracolumbar region ([place]) 01/21/2020   Back pain 11/17/2019   Compression fracture of L1 lumbar vertebra ([place]) 11/17/2019   Fracture of  multiple ribs 11/17/2019   Intractable nausea and vomiting 11/16/2019   AKI (acute kidney injury) ([place]) 11/15/2019   Nausea & vomiting 11/15/2019   Diarrhea 09/04/2019   Pressure injury of skin 04/17/2019   Septic arthritis of elbow, right ([employer]) 04/17/2019   Retinopathy 04/17/2019   Renal transplant, 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   Sleep apnea 11/16/2017   Diabetic foot infection ([place]) 04/06/2017   Type 2 diabetes mellitus with hyperglycemia, with long-term current use of insulin ([employer]) 04/06/2017   Ulcer of left heel, limited to breakdown of skin ([place]) 02/01/2017   Neuropathy 05/05/2015   Renal insufficiency 05/05/2015   Diabetic peripheral neuropathy ([place]) 01/12/2015   CKD (chronic kidney disease), stage III ([place]) 05/27/2014   History of MI (myocardial infarction) 10/06/2013   Nausea and vomiting 10/06/2013   Obesity (BMI 30-39.9) 09/20/2013   Multinodular goiter 04/17/2013   Normocytic anemia 02/03/2013   Proximal humerus fracture 10/15/2012   CAD (coronary artery disease) 01/11/2012   Hepatic cirrhosis ([place]) 07/06/2010   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   Hyperlipidemia 06/03/2008   Essential hypertension 12/23/2006   Acute MI ([place]) 2007      Subjective:    [name], adult    DOB: Feb 06, 1956, 66 y.o.   MRN: [id number]  Cc: diabetic foot chronic ulcers/infection  HPI:  [name] [name] is a 66 y.o. adult with failed distant renal transplant, ckd4, dm2,  chronic bilateral feet ulcer, here for hospital f/u bilateral LE cellulitis/abscess/OM  Patient admitted mid 09/2021 with severe sepsis and bilateral LE cellulitis in setting chronic heel ulcers  Bcx strep intermedius. 12/20 tte negative for vegetation  Had mri right foot that showed imaging acute OM 4th MT base and proximal diaphysis and adjacent cuboid, 4th TMT septic arthritis, patchy edema throughout midfoot bones and 2 small abscesses  Dr Sharol Given evaluated and recommended bilateral BKA. I had seen her as  well and agreed. However her husband refused to have this done. In this setting given high failure of abx alone, I had suggested for her to go on 6 weeks ceftriaxone and oral doxy and 2 week flagyl   11/09/21 She remains on doxy and iv ceftriaxone planned 6 weeks until 11/28/21 She continues to have significant drainage outside of the ulcers. The cellulitis had resolved No n/v/diarrhea  She is not yet on dialysis Seeing wound care/non-weight bearing  Not seeing foot doctor and not following up with dr Sharol Given She thinks the left heel ulcer is smaller  Discussed her low hb 6.5 with her. She runs in 7. No blood per rectum/melena. Getting iron and infusion of epo periodically, and due for another soon  She also follow a nephrologist   Allergies  Allergen Reactions   Bee Pollen Anaphylaxis   Fish-Derived Products Hives, Shortness Of Breath, Swelling and Rash    Hives get in throat causing trouble breathing   Mushroom Extract Complex Anaphylaxis   Penicillins Anaphylaxis    **Tolerated cefepime March 2021 Did it involve swelling of the face/tongue/throat, SOB, or low BP? Yes Did it involve sudden or severe rash/hives, skin peeling, or any reaction on the inside of your mouth or nose? No Did you need to seek medical attention at a hospital or doctor's office? Yes When did it last happen?  A few months ago If all above answers are "NO", may proceed with cephalosporin use.   Rosemary Oil Anaphylaxis   Shellfish Allergy Hives, Shortness Of Breath, Swelling and Rash   Tomato Hives and Shortness Of Breath    Hives in throat causes her trouble breathing   Acetaminophen Other (See Comments)    GI upset   Aloe Vera Hives   Broccoli [Brassica Oleracea] Hives   Acyclovir And Related Other (See Comments)    Unknown reaction   Naproxen Other (See Comments)    Unknown reaction      Outpatient Medications Prior to Visit  Medication Sig Dispense Refill   acetaminophen (TYLENOL) 500 MG tablet  Take 1,000 mg by mouth every 6 (six) hours as needed for headache.     apixaban (ELIQUIS) 5 MG TABS tablet Take 1 tablet (5 mg total) by mouth 2 (two) times daily. 60 tablet 0   carvedilol (COREG) 6.25 MG tablet Take 1 tablet (6.25 mg total) by mouth 2 (two) times daily with a meal. 60 tablet 0   cefTRIAXone (ROCEPHIN) IVPB Inject 2 g into the vein daily. Indication:  B/L Foot Osteo First Dose: Yes Last Day of Therapy:  11/29/21 Labs - Once weekly:  CBC/D and BMP, Labs - Every other week:  ESR and CRP Method of administration: IV Push Method of administration may be changed at the discretion of home infusion pharmacist based upon assessment of the patient and/or caregiver's ability to self-administer the medication ordered. 41 Units 0   Continuous Blood Gluc Receiver (FREESTYLE LIBRE 14 DAY READER) DEVI 1 Device by Does not apply route daily. 2 each  6   Continuous Blood Gluc Sensor (FREESTYLE LIBRE 14 DAY SENSOR) MISC 1 Device by Does not apply route daily. 2 each 6   diazepam (VALIUM) 5 MG tablet Take 1 tablet (5 mg total) by mouth every 12 (twelve) hours as needed for anxiety. 30 tablet 0   doxycycline (VIBRAMYCIN) 100 MG capsule Take 1 capsule (100 mg total) by mouth 2 (two) times daily. 82 capsule 0   DULoxetine (CYMBALTA) 60 MG capsule Take 1 capsule by mouth once daily (Patient taking differently: Take 60 mg by mouth daily.) 90 capsule 1   ferrous sulfate 325 (65 FE) MG tablet Take 1 tablet (325 mg total) by mouth 2 (two) times daily with a meal. 60 tablet 0   folic acid (FOLVITE) 1 MG tablet Take 2 tablets by mouth once daily (Patient taking differently: Take 2 mg by mouth daily.) 60 tablet 0   furosemide (LASIX) 20 MG tablet Take 1 tablet (20 mg total) by mouth daily. (Patient taking differently: Take 40 mg by mouth daily.) 90 tablet 3   HYDROcodone-acetaminophen (NORCO/VICODIN) 5-325 MG tablet Take 1 tablet by mouth every 6 (six) hours as needed for moderate pain. 120 tablet 0   insulin  glargine (LANTUS SOLOSTAR) 100 UNIT/ML Solostar Pen INJECT 20 UNITS SUBCUTANEOUSLY IN THE MORNING AND 40 AT NIGHT (Patient taking differently: 15-18 Units at bedtime.) 15 mL 3   montelukast (SINGULAIR) 10 MG tablet TAKE 1 TABLET BY MOUTH AT BEDTIME (Patient taking differently: Take 10 mg by mouth at bedtime.) 30 tablet 2   NONFORMULARY OR COMPOUNDED ITEM Please dispense 1 L border of normal saline, ABD pads, Kerlix roll, 30 gauze pieces, 1 roll of paper tape for twice a day following dressing change, gently clean lower extremity wounds with Normal Saline, pat dry. Cover with dry dressings (ABD pads) and secure with Kerlix roll gauze/paper tape 1 each 0   ondansetron (ZOFRAN) 8 MG tablet Take 0.5 tablets (4 mg total) by mouth every 8 (eight) hours as needed for nausea or vomiting. 20 tablet 0   pregabalin (LYRICA) 75 MG capsule TAKE 1 CAPSULE BY MOUTH THREE TIMES DAILY (Patient taking differently: Take 75 mg by mouth 2 (two) times daily.) 270 capsule 0   VITAMIN D PO Take 2 capsules by mouth daily.     atorvastatin (LIPITOR) 10 MG tablet Take 1 tablet by mouth once daily (Patient not taking: Reported on 11/09/2021) 90 tablet 1   Multiple Vitamin (MULTIVITAMIN WITH MINERALS) TABS tablet Take 1 tablet by mouth daily. (Patient not taking: Reported on 10/17/2021)     No facility-administered medications prior to visit.     Social History   Socioeconomic History   Marital status: Married    Spouse name: Not on file   Number of children: 1   Years of education: bachelors   Highest education level: Not on file  Occupational History   Occupation: transports organs for transplantation    Employer: [employer]  Tobacco Use   Smoking status: Never   Smokeless tobacco: Never  Vaping Use   Vaping Use: Never used  Substance and Sexual Activity   Alcohol use: No    Alcohol/week: 0.0 standard drinks   Drug use: No   Sexual activity: Not on file  Other Topics Concern   Not on file  Social  History Narrative   Widowed once,and divorced once   Lives with husband.  She had one living child and three who had deceased (one killed by drunk driver, one died  at age of 57-days due to heart problems, and other at the age of 5 due to heart problems)   She is a homemaker currently.  She was previously working as a Armed forces training and education officer.   She lost one child in the 20's   Daily caffeine   Social Determinants of Health   Financial Resource Strain: Not on file  Food Insecurity: Not on file  Transportation Needs: Not on file  Physical Activity: Not on file  Stress: Not on file  Social Connections: Not on file  Intimate Partner Violence: Not on file      Review of Systems    All other ros negative  Objective:    Temp 98.4 F (36.9 C) (Oral)  Nursing note and vital signs reviewed.  Physical Exam     General/constitutional: no distress, pleasant; here with husband HEENT: Normocephalic, PER, Conj Clear, EOMI, Oropharynx clear Neck supple CV: rrr no mrg Lungs: clear to auscultation, normal respiratory effort Abd: Soft, Nontender Ext: bilateral LE edema 1-2+; see picture for wounds Skin: see picture -- swelling/fluctuance/purulence right foot ulcers Neuro: nonfocal MSK: no peripheral joint swelling/tenderness/warmth; back spines nontender   Central line presence: right chest central line site no erythena/purulence  11/09/21 pictures            Labs:  Micro:  Serology:  Imaging:  Assessment & Plan:   Problem List Items Addressed This Visit       Endocrine   Diabetic foot infection ([place])     Other   Anemia   Bacteremia - Primary   Other Visit Diagnoses     Osteomyelitis, unspecified site, unspecified type ([place])       Cellulitis and abscess of foot, except toes       Chronic heel ulcer, unspecified laterality, with necrosis of muscle ([place])             No orders of the defined types were placed in this encounter.    We discussed  the grim nature of diabetic foot infection especially with heel involvement and to the extent of involvement on her right foot, and chronic LE edema/dm2/ckd4-5 I am not optimistic about success of abx treatment  We can go until 11/28/2021 and see if her infection will relapse. I do not see the advantage of ongoing abx beyond that without surgical intervention   With regard to her chronic anemia, no symptoms and no active blood loss. Suspect ckd related. I advised her to f/u heme/nephrology next week to get epo and potentially transfusion if needed  Follow up with me end of 10/2021 or early 11/2021 Continue cefttriaxone and oral doxycycline Will also refer to podiatry Continue wound care clinic visit Remove picc 11/28/21 when done with the abx course   See me on 12/02/21   Follow-up: No follow-ups on file.      Jabier Mutton, [facility] [phone]  pager   [phone] cell 11/09/2021, 3:33 PM

## 2021-11-10 ENCOUNTER — Encounter: Payer: Self-pay | Admitting: Family

## 2021-11-10 ENCOUNTER — Telehealth: Payer: Self-pay

## 2021-11-10 NOTE — Telephone Encounter (Signed)
Per Dr. Gale Journey reached out to [facility] IR to have central line removed after last dose on 11/28/21. Appt is scheduled for 1/30 at 9 AM. Relayed information to patient who requested IR appt be canceled. States she refuses to have cental line removed until her visit with Dr. Megan Salon on 12/06/21.  Will relay orders to [facility] to maintain picc until follow up appointment.  Leatrice Jewels, RMA

## 2021-11-10 NOTE — Telephone Encounter (Signed)
Called [facility] to relay verbal order to maintain central line after completion of antbx through follow up appt on 2/7. Relayed orders to [place], Pharmacist Leatrice Jewels, [place]

## 2021-11-10 NOTE — Addendum Note (Signed)
Addended byPrudencio Pair T on: 11/10/2021 09:40 AM   Modules accepted: Orders

## 2021-11-11 ENCOUNTER — Telehealth: Payer: Self-pay

## 2021-11-11 ENCOUNTER — Other Ambulatory Visit: Payer: Self-pay | Admitting: Hematology & Oncology

## 2021-11-11 DIAGNOSIS — E7211 Homocystinuria: Secondary | ICD-10-CM

## 2021-11-11 DIAGNOSIS — R197 Diarrhea, unspecified: Secondary | ICD-10-CM

## 2021-11-11 NOTE — Telephone Encounter (Signed)
Patient called stating that she has had diarrhea x 2 days due to Ceftriaxone and Doxycycline. Patient stated that it has been worse today - having to go to the bathroom to constantly wipe. Patient is requesting a medication to be sent to [facility] on [address] in [facility].

## 2021-11-11 NOTE — Telephone Encounter (Signed)
Spoke with Maryjo about her diarrhea. States she has been on extended courses of antibiotics before and never experienced diarrhea this severe before. Reviewed Dr. Hale Bogus notes from 2020 stating concern for CDI; however, patient's CDI PCR returned negative at that time. Dr. Megan Salon, are you concerned for CDI? Stated she should watch her symptoms over the weekend and that we would be in contact with her next week about need for a sooner appointment/stool sample.  Patient is taking doxycycline appropriately with food and avoiding dairy. Recommended she try more food with the doxycycline (only eating a biscuit or bread with it right now), but she states she is also nauseous and has limited appetite. She already has Zofran prescribed and has been using it as well. Patient is currently using Lomotil liquid OTC; unsure of the dose she states as her husband gives it to her. I discussed that if she is using the standard dose of it, that it can be doubled without an issue. If there is concern for CDI, this should be held. Recommended trying Pepto-Bismol as well. Stated this needs to be separated from her doxycycline; she said she would try it right in the morning and then take her doxycycline 2 hours later with breakfast.   Let me know if there is anything else I can do; thank you!  Alfonse Spruce, PharmD, CPP Clinical Pharmacist Practitioner [facility]

## 2021-11-14 ENCOUNTER — Encounter: Payer: Self-pay | Admitting: Family

## 2021-11-14 ENCOUNTER — Inpatient Hospital Stay (HOSPITAL_BASED_OUTPATIENT_CLINIC_OR_DEPARTMENT_OTHER): Payer: PRIVATE HEALTH INSURANCE | Admitting: Family

## 2021-11-14 ENCOUNTER — Other Ambulatory Visit: Payer: Self-pay

## 2021-11-14 ENCOUNTER — Inpatient Hospital Stay: Payer: PRIVATE HEALTH INSURANCE

## 2021-11-14 ENCOUNTER — Telehealth: Payer: Self-pay

## 2021-11-14 ENCOUNTER — Inpatient Hospital Stay: Payer: PRIVATE HEALTH INSURANCE | Attending: Hematology & Oncology

## 2021-11-14 VITALS — BP 154/61 | HR 77 | Temp 98.1°F | Resp 19

## 2021-11-14 DIAGNOSIS — N183 Chronic kidney disease, stage 3 unspecified: Secondary | ICD-10-CM | POA: Insufficient documentation

## 2021-11-14 DIAGNOSIS — D500 Iron deficiency anemia secondary to blood loss (chronic): Secondary | ICD-10-CM | POA: Diagnosis not present

## 2021-11-14 DIAGNOSIS — D508 Other iron deficiency anemias: Secondary | ICD-10-CM

## 2021-11-14 DIAGNOSIS — D649 Anemia, unspecified: Secondary | ICD-10-CM | POA: Diagnosis not present

## 2021-11-14 DIAGNOSIS — D631 Anemia in chronic kidney disease: Secondary | ICD-10-CM | POA: Insufficient documentation

## 2021-11-14 LAB — CBC WITH DIFFERENTIAL (CANCER CENTER ONLY)
Abs Immature Granulocytes: 0.06 10*3/uL (ref 0.00–0.07)
Basophils Absolute: 0.1 10*3/uL (ref 0.0–0.1)
Basophils Relative: 1 %
Eosinophils Absolute: 0.2 10*3/uL (ref 0.0–0.5)
Eosinophils Relative: 4 %
HCT: 20 % — ABNORMAL LOW (ref 36.0–46.0)
Hemoglobin: 6 g/dL — CL (ref 12.0–15.0)
Immature Granulocytes: 1 %
Lymphocytes Relative: 19 %
Lymphs Abs: 1.2 10*3/uL (ref 0.7–4.0)
MCH: 26.4 pg (ref 26.0–34.0)
MCHC: 30 g/dL (ref 30.0–36.0)
MCV: 88.1 fL (ref 80.0–100.0)
Monocytes Absolute: 0.6 10*3/uL (ref 0.1–1.0)
Monocytes Relative: 9 %
Neutro Abs: 4.3 10*3/uL (ref 1.7–7.7)
Neutrophils Relative %: 66 %
Platelet Count: 140 10*3/uL — ABNORMAL LOW (ref 150–400)
RBC: 2.27 MIL/uL — ABNORMAL LOW (ref 3.87–5.11)
RDW: 18.6 % — ABNORMAL HIGH (ref 11.5–15.5)
WBC Count: 6.4 10*3/uL (ref 4.0–10.5)
nRBC: 0 % (ref 0.0–0.2)

## 2021-11-14 LAB — PREPARE RBC (CROSSMATCH)

## 2021-11-14 LAB — CMP (CANCER CENTER ONLY)
ALT: 5 U/L (ref 0–44)
AST: 12 U/L — ABNORMAL LOW (ref 15–41)
Albumin: 2.6 g/dL — ABNORMAL LOW (ref 3.5–5.0)
Alkaline Phosphatase: 102 U/L (ref 38–126)
Anion gap: 8 (ref 5–15)
BUN: 54 mg/dL — ABNORMAL HIGH (ref 8–23)
CO2: 25 mmol/L (ref 22–32)
Calcium: 8.6 mg/dL — ABNORMAL LOW (ref 8.9–10.3)
Chloride: 103 mmol/L (ref 98–111)
Creatinine: 3.44 mg/dL (ref 0.44–1.00)
GFR, Estimated: 14 mL/min — ABNORMAL LOW (ref >60)
Glucose, Bld: 81 mg/dL (ref 70–99)
Potassium: 4.5 mmol/L (ref 3.5–5.1)
Sodium: 136 mmol/L (ref 135–145)
Total Bilirubin: 0.4 mg/dL (ref 0.3–1.2)
Total Protein: 7.5 g/dL (ref 6.5–8.1)

## 2021-11-14 MED ORDER — EPOETIN ALFA-EPBX 40000 UNIT/ML IJ SOLN
40000.0000 [IU] | Freq: Once | INTRAMUSCULAR | Status: AC
  Administered 2021-11-14: 40000 [IU] via SUBCUTANEOUS
  Filled 2021-11-14: qty 1

## 2021-11-14 NOTE — Telephone Encounter (Signed)
C. Diff test ordered. Patient aware to come pick up stool sample kit for testing.     [place], CMA

## 2021-11-14 NOTE — Telephone Encounter (Signed)
Received critical creatinine of 3.44 from [facility] in lab, NP aware.

## 2021-11-14 NOTE — Progress Notes (Signed)
Hematology and Oncology Follow Up Visit  [name] [name] [id number] [birth year] 66 y.o. 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 She was in the hospital in December with severe sepsis and bilateral lower extremity cellulitis with chronic heel ulcers. She has dressings to both feet and is now home receiving IV antibiotics from a central line with home health daily.  She is having multiple episodes of diarrhea daily and is picking up her kit for CDiff testing later today.  She has not noted any obvious blood loss. No petechiae. Hgb is down to 6.0, MCV 88, platelets 140 and WBC count 6.4.  No fever, chills, n/v, cough, rash, dizziness, palpitations, abdominal pain or changes in bowel or bladder habits.  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  No falls or syncope to report. She uses her motorized scooter while out and a walker at home.  Her appetite comes and goes. She is supplementing with Boost daily. She is doing her best to stay well hydrated. She could not stand for weight today.   ECOG Performance Status: 1 - Symptomatic but completely ambulatory  Medications:  Allergies as of 11/14/2021       Reactions   Bee Pollen Anaphylaxis   Fish-derived Products Hives, Shortness Of Breath, Swelling, Rash   Hives get in throat causing trouble breathing   Mushroom Extract Complex Anaphylaxis   Penicillins Anaphylaxis   **Tolerated cefepime March 2021 Did it involve swelling of the face/tongue/throat, SOB, or low BP? Yes Did it involve sudden or severe rash/hives, skin peeling, or any reaction on the inside of  your mouth or nose? No Did you need to seek medical attention at a hospital or doctor's office? Yes When did it last happen?  A few months ago If all above answers are "NO", may proceed with cephalosporin use.   Rosemary Oil Anaphylaxis   Shellfish Allergy Hives, Shortness Of Breath, Swelling, Rash   Tomato Hives, Shortness Of Breath   Hives in throat causes her trouble breathing   Acetaminophen Other (See Comments)   GI upset   Aloe Vera Hives   Broccoli [brassica Oleracea] Hives   Acyclovir And Related Other (See Comments)   Unknown reaction   Naproxen Other (See Comments)   Unknown reaction        Medication List        Accurate as of November 14, 2021  3:41 PM. If you have any questions, ask your nurse or doctor.          acetaminophen 500 MG tablet Commonly known as: TYLENOL Take 1,000 mg by mouth every 6 (six) hours as needed for headache.   apixaban 5 MG Tabs tablet Commonly known as: ELIQUIS Take 1 tablet (5 mg total) by mouth 2 (two) times daily.   atorvastatin 10 MG tablet Commonly known as: LIPITOR Take 1 tablet by mouth once daily   carvedilol 6.25 MG tablet Commonly known as: COREG Take 1 tablet (6.25 mg total) by mouth 2 (two) times daily with a meal.   cefTRIAXone  IVPB Commonly known as: ROCEPHIN Inject 2 g into  the vein daily. Indication:  B/L Foot Osteo First Dose: Yes Last Day of Therapy:  11/29/21 Labs - Once weekly:  CBC/D and BMP, Labs - Every other week:  ESR and CRP Method of administration: IV Push Method of administration may be changed at the discretion of home infusion pharmacist based upon assessment of the patient and/or caregiver's ability to self-administer the medication ordered.   diazepam 5 MG tablet Commonly known as: VALIUM Take 1 tablet (5 mg total) by mouth every 12 (twelve) hours as needed for anxiety.   doxycycline 100 MG capsule Commonly known as: Vibramycin Take 1 capsule (100 mg total) by mouth 2 (two) times  daily.   DULoxetine 60 MG capsule Commonly known as: CYMBALTA Take 1 capsule by mouth once daily   ferrous sulfate 325 (65 FE) MG tablet Take 1 tablet (325 mg total) by mouth 2 (two) times daily with a meal.   folic acid 1 MG tablet Commonly known as: FOLVITE Take 2 tablets by mouth once daily   FreeStyle Libre 14 Day Reader Kerrin Mo 1 Device by Does not apply route daily.   FreeStyle Libre 14 Day Sensor Misc 1 Device by Does not apply route daily.   furosemide 20 MG tablet Commonly known as: LASIX Take 1 tablet (20 mg total) by mouth daily. What changed: how much to take   HYDROcodone-acetaminophen 5-325 MG tablet Commonly known as: NORCO/VICODIN Take 1 tablet by mouth every 6 (six) hours as needed for moderate pain.   Lantus SoloStar 100 UNIT/ML Solostar Pen Generic drug: insulin glargine INJECT 20 UNITS SUBCUTANEOUSLY IN THE MORNING AND 40 AT NIGHT What changed:  how much to take when to take this additional instructions   montelukast 10 MG tablet Commonly known as: SINGULAIR TAKE 1 TABLET BY MOUTH AT BEDTIME   multivitamin with minerals Tabs tablet Take 1 tablet by mouth daily.   NONFORMULARY OR COMPOUNDED ITEM Please dispense 1 L border of normal saline, ABD pads, Kerlix roll, 30 gauze pieces, 1 roll of paper tape for twice a day following dressing change, gently clean lower extremity wounds with Normal Saline, pat dry. Cover with dry dressings (ABD pads) and secure with Kerlix roll gauze/paper tape   ondansetron 8 MG tablet Commonly known as: Zofran Take 0.5 tablets (4 mg total) by mouth every 8 (eight) hours as needed for nausea or vomiting.   pregabalin 75 MG capsule Commonly known as: LYRICA TAKE 1 CAPSULE BY MOUTH THREE TIMES DAILY What changed: when to take this   VITAMIN D PO Take 2 capsules by mouth daily.        Allergies:  Allergies  Allergen Reactions   Bee Pollen Anaphylaxis   Fish-Derived Products Hives, Shortness Of Breath, Swelling and  Rash    Hives get in throat causing trouble breathing   Mushroom Extract Complex Anaphylaxis   Penicillins Anaphylaxis    **Tolerated cefepime March 2021 Did it involve swelling of the face/tongue/throat, SOB, or low BP? Yes Did it involve sudden or severe rash/hives, skin peeling, or any reaction on the inside of your mouth or nose? No Did you need to seek medical attention at a hospital or doctor's office? Yes When did it last happen?  A few months ago If all above answers are "NO", may proceed with cephalosporin use.   Rosemary Oil Anaphylaxis   Shellfish Allergy Hives, Shortness Of Breath, Swelling and Rash   Tomato Hives and Shortness Of Breath    Hives in throat causes her trouble breathing  Acetaminophen Other (See Comments)    GI upset   Aloe Vera Hives   Broccoli [Brassica Oleracea] Hives   Acyclovir And Related Other (See Comments)    Unknown reaction   Naproxen Other (See Comments)    Unknown reaction    Past Medical History, Surgical history, Social history, and Family History were reviewed and updated.  Review of Systems: All other 10 point review of systems is negative.   Physical Exam:  oral temperature is 98.1 F (36.7 C). Her blood pressure is 154/61 (abnormal) and her pulse is 77. Her respiration is 19 and oxygen saturation is 97%.   Wt Readings from Last 3 Encounters:  10/22/21 206 lb 9.1 oz (93.7 kg)  09/21/21 164 lb (74.4 kg)  07/29/21 162 lb (73.5 kg)    Ocular: Sclerae unicteric, pupils equal, round and reactive to light Ear-nose-throat: Oropharynx clear, dentition fair Lymphatic: No cervical or supraclavicular adenopathy Lungs no rales or rhonchi, good excursion bilaterally Heart regular rate and rhythm, no murmur appreciated Abd soft, nontender, positive bowel sounds MSK no focal spinal tenderness, no joint edema Neuro: non-focal, well-oriented, appropriate affect Breasts: Deferred   Lab Results  Component Value Date   WBC 6.4 11/14/2021    HGB 6.0 (LL) 11/14/2021   HCT 20.0 (L) 11/14/2021   MCV 88.1 11/14/2021   PLT 140 (L) 11/14/2021   Lab Results  Component Value Date   FERRITIN 450 (H) 09/21/2021   IRON 49 09/21/2021   TIBC 216 (L) 09/21/2021   UIBC 168 09/21/2021   IRONPCTSAT 23 09/21/2021   Lab Results  Component Value Date   RETICCTPCT 2.1 07/29/2021   RBC 2.27 (L) 11/14/2021   No results found for: KPAFRELGTCHN, LAMBDASER, KAPLAMBRATIO No results found for: IGGSERUM, IGA, IGMSERUM No results found for: Ronnald Ramp, A1GS, A2GS, Violet Baldy, MSPIKE, SPEI   Chemistry      Component Value Date/Time   NA 136 11/14/2021 1424   K 4.5 11/14/2021 1424   CL 103 11/14/2021 1424   CO2 25 11/14/2021 1424   BUN 54 (H) 11/14/2021 1424   CREATININE 3.44 (HH) 11/14/2021 1424   CREATININE 1.97 (H) 06/24/2020 1431      Component Value Date/Time   CALCIUM 8.6 (L) 11/14/2021 1424   ALKPHOS 102 11/14/2021 1424   AST 12 (L) 11/14/2021 1424   ALT 5 11/14/2021 1424   BILITOT 0.4 11/14/2021 1424       Impression and Plan: [name] [name] is a very pleasant 66 yo caucasian female with long standing history of iron deficiency anemia as well as history of DVT's in both the right and left peroneal and posterior tibial veins while she was bed bound in the hospital being treated for osteomyelitis.  She had an elevated homocystine level, mildly elevated anticardiolipin antibody IgG level and elevated D-dimer at that time as well as on repeat lab work.  ESA given, Hgb 6. She will also get 2 units of blood tomorrow.  Iron studies are pending. We will replace if needed.  Lab and injection in 3 weeks. Follow-up in 6 weeks.   Lottie Dawson, NP 1/16/20233:41 PM

## 2021-11-14 NOTE — Addendum Note (Signed)
Addended by: Tomi Bamberger on: 11/14/2021 08:39 AM   Modules accepted: Orders

## 2021-11-15 ENCOUNTER — Ambulatory Visit: Payer: Medicare Other | Admitting: Podiatry

## 2021-11-15 ENCOUNTER — Inpatient Hospital Stay: Payer: PRIVATE HEALTH INSURANCE

## 2021-11-15 DIAGNOSIS — D500 Iron deficiency anemia secondary to blood loss (chronic): Secondary | ICD-10-CM

## 2021-11-15 DIAGNOSIS — N183 Chronic kidney disease, stage 3 unspecified: Secondary | ICD-10-CM | POA: Diagnosis not present

## 2021-11-15 DIAGNOSIS — D649 Anemia, unspecified: Secondary | ICD-10-CM

## 2021-11-15 LAB — IRON AND IRON BINDING CAPACITY (CC-WL,HP ONLY)
Iron: 31 ug/dL (ref 28–170)
Saturation Ratios: 19 % (ref 10.4–31.8)
TIBC: 162 ug/dL — ABNORMAL LOW (ref 250–450)
UIBC: 131 ug/dL — ABNORMAL LOW (ref 148–442)

## 2021-11-15 LAB — FERRITIN: Ferritin: 652 ng/mL — ABNORMAL HIGH (ref 11–307)

## 2021-11-15 MED ORDER — DIPHENHYDRAMINE HCL 25 MG PO CAPS
25.0000 mg | ORAL_CAPSULE | Freq: Once | ORAL | Status: AC
  Administered 2021-11-15: 25 mg via ORAL
  Filled 2021-11-15: qty 1

## 2021-11-15 MED ORDER — ACETAMINOPHEN 325 MG PO TABS
650.0000 mg | ORAL_TABLET | Freq: Once | ORAL | Status: AC
  Administered 2021-11-15: 650 mg via ORAL
  Filled 2021-11-15: qty 2

## 2021-11-15 MED ORDER — SODIUM CHLORIDE 0.9% FLUSH
3.0000 mL | INTRAVENOUS | Status: AC | PRN
  Administered 2021-11-15: 3 mL

## 2021-11-15 MED ORDER — SODIUM CHLORIDE 0.9% IV SOLUTION
250.0000 mL | Freq: Once | INTRAVENOUS | Status: AC
  Administered 2021-11-15: 250 mL via INTRAVENOUS

## 2021-11-15 MED ORDER — FUROSEMIDE 10 MG/ML IJ SOLN
20.0000 mg | Freq: Once | INTRAMUSCULAR | Status: AC
  Administered 2021-11-15: 20 mg via INTRAVENOUS
  Filled 2021-11-15: qty 4

## 2021-11-15 MED ORDER — HEPARIN SOD (PORK) LOCK FLUSH 100 UNIT/ML IV SOLN
250.0000 [IU] | INTRAVENOUS | Status: AC | PRN
  Administered 2021-11-15: 250 [IU]

## 2021-11-15 NOTE — Patient Instructions (Signed)
Blood Transfusion, Adult A blood transfusion is a procedure in which you receive blood through an IV tube. You may need this procedure because of: A bleeding disorder. An illness. An injury. A surgery. The blood may come from someone else (a donor). You may also be able to donate blood for yourself. The blood given in a transfusion is made up of different types of cells. You may get: Red blood cells. These carry oxygen to the cells in the body. White blood cells. These help you fight infections. Platelets. These help your blood to clot. Plasma. This is the liquid part of your blood. It carries proteins and other substances through the body. If you have a clotting disorder, you may also get other types of blood products. Tell your doctor about: Any blood disorders you have. Any reactions you have had during a blood transfusion in the past. Any allergies you have. All medicines you are taking, including vitamins, herbs, eye drops, creams, and over-the-counter medicines. Any surgeries you have had. Any medical conditions you have. This includes any recent fever or cold symptoms. Whether you are pregnant or may be pregnant. What are the risks? Generally, this is a safe procedure. However, problems may occur. The most common problems include: A mild allergic reaction. This includes red, swollen areas of skin (hives) and itching. Fever or chills. This may be the body's response to new blood cells received. This may happen during or up to 4 hours after the transfusion. More serious problems may include: Too much fluid in the lungs. This may cause breathing problems. A serious allergic reaction. This includes breathing trouble or swelling around the face and lips. Lung injury. This causes breathing trouble and low oxygen in the blood. This can happen within hours of the transfusion or days later. Too much iron. This can happen after getting many blood transfusions over a period of time. An  infection or virus passed through the blood. This is rare. Donated blood is carefully tested before it is given. Your body's defense system (immune system) trying to attack the new blood cells. This is rare. Symptoms may include fever, chills, nausea, low blood pressure, and low back or chest pain. Donated cells attacking healthy tissues. This is rare. What happens before the procedure? Medicines Ask your doctor about: Changing or stopping your normal medicines. This is important. Taking aspirin and ibuprofen. Do not take these medicines unless your doctor tells you to take them. Taking over-the-counter medicines, vitamins, herbs, and supplements. General instructions Follow instructions from your doctor about what you cannot eat or drink. You will have a blood test to find out your blood type. The test also finds out what type of blood your body will accept and matches it to the donor type. If you are going to have a planned surgery, you may be able to donate your own blood. This may be done in case you need a transfusion. You will have your temperature, blood pressure, and pulse checked. You may receive medicine to help prevent an allergic reaction. This may be done if you have had a reaction to a transfusion before. This medicine may be given to you by mouth or through an IV tube. This procedure lasts about 1-4 hours. Plan for the time you need. What happens during the procedure?  An IV tube will be put into one of your veins. The bag of donated blood will be attached to your IV tube. Then, the blood will enter through your vein. Your temperature,  blood pressure, and pulse will be checked often. This is done to find early signs of a transfusion reaction. Tell your nurse right away if you have any of these symptoms: Shortness of breath or trouble breathing. Chest or back pain. Fever or chills. Red, swollen areas of skin or itching. If you have any signs or symptoms of a reaction, your  transfusion will be stopped. You may also be given medicine. When the transfusion is finished, your IV tube will be taken out. Pressure may be put on the IV site for a few minutes. A bandage (dressing) will be put on the IV site. The procedure may vary among doctors and hospitals. What happens after the procedure? You will be monitored until you leave the hospital or clinic. This includes checking your temperature, blood pressure, pulse, breathing rate, and blood oxygen level. Your blood may be tested to see how you are responding to the transfusion. You may be warmed with fluids or blankets. This is done to keep the temperature of your body normal. If you have your procedure in an outpatient setting, you will be told whom to contact to report any reactions. Where to find more information To learn more, visit the [employer]: redcross.org Summary A blood transfusion is a procedure in which you are given blood through an IV tube. The blood may come from someone else (a donor). You may also be able to donate blood for yourself. The blood you are given is made up of different blood cells. You may receive red blood cells, platelets, plasma, or white blood cells. Your temperature, blood pressure, and pulse will be checked often. After the procedure, your blood may be tested to see how you are responding. This information is not intended to replace advice given to you by your health care provider. Make sure you discuss any questions you have with your health care provider. Document Revised: 04/10/2019 Document Reviewed: 04/10/2019 Elsevier Patient Education  [employer].

## 2021-11-15 NOTE — Progress Notes (Signed)
At 1345 pt. stated " I am dizzy". VS WNL (see flowsheet). Hydration and snacks offered and Lottie Dawson, NP informed. After eating, patient stated that she "feels better". Will continue to monitor.

## 2021-11-16 ENCOUNTER — Telehealth: Payer: Self-pay | Admitting: *Deleted

## 2021-11-16 LAB — BPAM RBC
Blood Product Expiration Date: 202301312359
Blood Product Expiration Date: 202301312359
ISSUE DATE / TIME: 202301170759
ISSUE DATE / TIME: 202301170759
Unit Type and Rh: 600
Unit Type and Rh: 600

## 2021-11-16 LAB — TYPE AND SCREEN
ABO/RH(D): A NEG
Antibody Screen: NEGATIVE
Unit division: 0
Unit division: 0

## 2021-11-16 NOTE — Telephone Encounter (Signed)
No 11/14/21 LOS

## 2021-11-16 NOTE — Telephone Encounter (Signed)
Per 11/14/21 los - called and lvm of upcoming appointments - requested call back to confirm

## 2021-11-20 ENCOUNTER — Other Ambulatory Visit: Payer: Self-pay | Admitting: Family Medicine

## 2021-11-20 DIAGNOSIS — T7840XD Allergy, unspecified, subsequent encounter: Secondary | ICD-10-CM

## 2021-11-21 ENCOUNTER — Other Ambulatory Visit: Payer: PRIVATE HEALTH INSURANCE

## 2021-11-21 ENCOUNTER — Other Ambulatory Visit: Payer: Self-pay

## 2021-11-21 ENCOUNTER — Other Ambulatory Visit: Payer: Self-pay | Admitting: Internal Medicine

## 2021-11-21 DIAGNOSIS — R197 Diarrhea, unspecified: Secondary | ICD-10-CM

## 2021-11-22 ENCOUNTER — Other Ambulatory Visit: Payer: PRIVATE HEALTH INSURANCE

## 2021-11-22 ENCOUNTER — Telehealth: Payer: Self-pay | Admitting: *Deleted

## 2021-11-22 ENCOUNTER — Ambulatory Visit: Payer: PRIVATE HEALTH INSURANCE

## 2021-11-22 ENCOUNTER — Ambulatory Visit: Payer: PRIVATE HEALTH INSURANCE | Admitting: Family

## 2021-11-22 LAB — C. DIFFICILE GDH AND TOXIN A/B
GDH ANTIGEN: NOT DETECTED
MICRO NUMBER:: 12906680
SPECIMEN QUALITY:: ADEQUATE
TOXIN A AND B: NOT DETECTED

## 2021-11-22 NOTE — Telephone Encounter (Signed)
Turned pts call lmovm - pt does not need iron at this time, F/u as scheduled.

## 2021-11-24 ENCOUNTER — Encounter: Admit: 2021-11-24 | Discharge: 2021-11-24 | Payer: MEDICARE

## 2021-11-24 DIAGNOSIS — E1129 Type 2 diabetes mellitus with other diabetic kidney complication: Secondary | ICD-10-CM

## 2021-11-24 MED ORDER — CYCLOBENZAPRINE 5 MG PO TAB
ORAL_TABLET | Freq: Every evening | ORAL | 3 refills | 30.00000 days | Status: AC
Start: 2021-11-24 — End: ?

## 2021-11-24 MED ORDER — ACCU-CHEK AVIVA PLUS TEST STRP MISC STRP
ORAL_STRIP | Freq: Every day | SUBCUTANEOUS | 1 refills | 90.00000 days | Status: AC
Start: 2021-11-24 — End: ?

## 2021-11-24 MED ORDER — DROPSAFE ALCOHOL PREP PADS TP PADM
1 refills | 100.00000 days | Status: AC
Start: 2021-11-24 — End: ?

## 2021-11-24 MED ORDER — FAMOTIDINE 20 MG PO TAB
ORAL_TABLET | Freq: Two times a day (BID) | ORAL | 2 refills | 90.00000 days | Status: AC
Start: 2021-11-24 — End: ?

## 2021-11-28 ENCOUNTER — Telehealth: Payer: Self-pay

## 2021-11-28 ENCOUNTER — Ambulatory Visit (HOSPITAL_COMMUNITY): Payer: PRIVATE HEALTH INSURANCE

## 2021-11-28 DIAGNOSIS — D649 Anemia, unspecified: Secondary | ICD-10-CM

## 2021-11-28 NOTE — Telephone Encounter (Signed)
Received VM from pt stating that she is experiencing GI bleeding. Reports she was seen in ED over the weekend and was told to contact our office.  Contacted pt for more information. Pt reports she was at [facility] ED over the weekend. No records found from [facility] or in Care Everywhere. Pt insists she was at [facility] Reports she did have blood drawn. Pt states did not receive blood in ED but "they stopped the bleeding." Pt has appt with Dr Megan Salon tomorrow. Pt does not have a GI doctor. Pt has not notified PCP. Pt does have dizziness, palpitation, SOB on exertion and says she is pale.   Per Judson Roch, NP offered for pt to come in for labwork to determine if she needs a blood transfusion. Pt states she is unable to come in until Wednesday due to other appts this week & transportation issues. Aware to return to ED if symptoms worsen before then.

## 2021-11-29 ENCOUNTER — Encounter (HOSPITAL_BASED_OUTPATIENT_CLINIC_OR_DEPARTMENT_OTHER): Payer: PRIVATE HEALTH INSURANCE | Attending: Internal Medicine | Admitting: Internal Medicine

## 2021-11-29 ENCOUNTER — Other Ambulatory Visit: Payer: Self-pay

## 2021-11-29 ENCOUNTER — Ambulatory Visit (INDEPENDENT_AMBULATORY_CARE_PROVIDER_SITE_OTHER): Payer: PRIVATE HEALTH INSURANCE | Admitting: Internal Medicine

## 2021-11-29 ENCOUNTER — Telehealth: Payer: Self-pay

## 2021-11-29 DIAGNOSIS — R197 Diarrhea, unspecified: Secondary | ICD-10-CM | POA: Diagnosis not present

## 2021-11-29 DIAGNOSIS — E11628 Type 2 diabetes mellitus with other skin complications: Secondary | ICD-10-CM | POA: Diagnosis not present

## 2021-11-29 DIAGNOSIS — E1122 Type 2 diabetes mellitus with diabetic chronic kidney disease: Secondary | ICD-10-CM | POA: Insufficient documentation

## 2021-11-29 DIAGNOSIS — I130 Hypertensive heart and chronic kidney disease with heart failure and stage 1 through stage 4 chronic kidney disease, or unspecified chronic kidney disease: Secondary | ICD-10-CM | POA: Diagnosis not present

## 2021-11-29 DIAGNOSIS — L97514 Non-pressure chronic ulcer of other part of right foot with necrosis of bone: Secondary | ICD-10-CM | POA: Diagnosis present

## 2021-11-29 DIAGNOSIS — E11621 Type 2 diabetes mellitus with foot ulcer: Secondary | ICD-10-CM | POA: Insufficient documentation

## 2021-11-29 DIAGNOSIS — L97524 Non-pressure chronic ulcer of other part of left foot with necrosis of bone: Secondary | ICD-10-CM | POA: Insufficient documentation

## 2021-11-29 DIAGNOSIS — M86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, right ankle and foot: Secondary | ICD-10-CM | POA: Diagnosis not present

## 2021-11-29 DIAGNOSIS — Z794 Long term (current) use of insulin: Secondary | ICD-10-CM | POA: Diagnosis not present

## 2021-11-29 DIAGNOSIS — M86672 Other chronic osteomyelitis, left ankle and foot: Secondary | ICD-10-CM | POA: Diagnosis not present

## 2021-11-29 DIAGNOSIS — I509 Heart failure, unspecified: Secondary | ICD-10-CM | POA: Insufficient documentation

## 2021-11-29 DIAGNOSIS — R7881 Bacteremia: Secondary | ICD-10-CM

## 2021-11-29 DIAGNOSIS — L089 Local infection of the skin and subcutaneous tissue, unspecified: Secondary | ICD-10-CM

## 2021-11-29 DIAGNOSIS — B955 Unspecified streptococcus as the cause of diseases classified elsewhere: Secondary | ICD-10-CM

## 2021-11-29 DIAGNOSIS — N183 Chronic kidney disease, stage 3 unspecified: Secondary | ICD-10-CM | POA: Insufficient documentation

## 2021-11-29 MED ORDER — CEFUROXIME AXETIL 250 MG PO TABS: 250.0000 mg | ORAL_TABLET | Freq: Two times a day (BID) | ORAL | 1 refills | Status: DC

## 2021-11-29 MED ORDER — METRONIDAZOLE 500 MG PO TABS: 500.0000 mg | ORAL_TABLET | Freq: Three times a day (TID) | ORAL | 1 refills | Status: DC

## 2021-11-29 NOTE — Progress Notes (Signed)
[name] [name] for Infectious Disease  Patient Active Problem List   Diagnosis Date Noted   Streptococcal bacteremia     Priority: High   Diarrhea 09/04/2019    Priority: High   Diabetic foot infection ([place]) 04/06/2017    Priority: High   Severe protein-calorie malnutrition ([place])    Severe sepsis ([place])    Osteomyelitis of left foot ([place]) 10/16/2021   Cellulitis of right foot 10/16/2021   Hyponatremia 10/16/2021   Hyperkalemia 10/16/2021   Acute kidney injury superimposed on CKD ([place]) 10/16/2021   Leukocytosis 10/16/2021   Diabetic foot ulcers ([place]) 10/16/2021   DM (diabetes mellitus), type 2 with renal complications ([place]) 30/04/6225   Generalized weakness 10/16/2021   Portal hypertension ([place]) 08/16/2021   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[place]) 08/16/2021   Eczema of scalp 06/27/2021   Hypothyroidism 06/27/2021   Pruritus 06/27/2021   Seasonal allergic rhinitis due to pollen 02/02/2021   CRF (chronic renal failure), stage 4 (severe) ([place]) 02/02/2021   PTSD (post-traumatic stress disorder)    Osteoarthritis    MRSA (methicillin resistant Staphylococcus aureus)    Hypertension    High cholesterol    Hepatic steatosis    GERD (gastroesophageal reflux disease)    Fibromyalgia    Fatty liver    Exertional shortness of breath    Episode of visual loss of left eye    Depression    Dehiscence of closure of skin    Constipation    Chronic kidney disease    Cataract    Bulging lumbar disc    Asthma    Anxiety    Anginal pain (HCC)    Anemia    Tinea capitis 10/11/2020   Tinea corporis 10/11/2020   SOB (shortness of breath) 10/11/2020   Hip pain 05/16/2020   IDA (iron deficiency anemia) 05/04/2020   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 ([place]) 03/16/2020   S/P lumbar fusion 03/04/2020   Spinal stenosis of lumbar region 01/22/2020   Osteomyelitis of vertebra of thoracolumbar region ([place]) 01/21/2020   Back pain  11/17/2019   Compression fracture of L1 lumbar vertebra ([place]) 11/17/2019   Fracture of multiple ribs 11/17/2019   Intractable nausea and vomiting 11/16/2019   AKI (acute kidney injury) ([place]) 11/15/2019   Nausea & vomiting 11/15/2019   Pressure injury of skin 04/17/2019   Septic arthritis of elbow, right ([place]) 04/17/2019   Retinopathy 04/17/2019   Renal transplant, 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   Sleep apnea 11/16/2017   Type 2 diabetes mellitus with hyperglycemia, with long-term current use of insulin ([place]) 04/06/2017   Neuropathy 05/05/2015   Renal insufficiency 05/05/2015   Diabetic peripheral neuropathy ([place]) 01/12/2015   CKD (chronic kidney disease), stage III ([place]) 05/27/2014   History of MI (myocardial infarction) 10/06/2013   Nausea and vomiting 10/06/2013   Obesity (BMI 30-39.9) 09/20/2013   Multinodular goiter 04/17/2013   Normocytic anemia 02/03/2013   Proximal humerus fracture 10/15/2012   CAD (coronary artery disease) 01/11/2012   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   METRONIDAZOLE (FLAGYL) 500 MG TABLET    Take 1 tablet (500 mg total) by mouth 3 (three) times daily.  Previous Medications   ACETAMINOPHEN (TYLENOL) 500 MG TABLET    Take 1,000 mg by mouth every 6 (six) hours as needed for headache.   APIXABAN (ELIQUIS) 5 MG TABS TABLET    Take 1 tablet (5 mg total) by mouth 2 (two) times daily.   ATORVASTATIN (LIPITOR) 10 MG TABLET    Take 1 tablet by mouth once daily   CARVEDILOL (COREG) 6.25 MG TABLET    Take 1 tablet (6.25 mg total) by mouth 2 (two) times daily with a meal.   CONTINUOUS BLOOD GLUC RECEIVER (FREESTYLE LIBRE 14 DAY READER) DEVI    1 Device by Does not apply route daily.   CONTINUOUS BLOOD  GLUC SENSOR (FREESTYLE LIBRE 14 DAY SENSOR) MISC    1 Device by Does not apply route daily.   DIAZEPAM (VALIUM) 5 MG TABLET    Take 1 tablet (5 mg total) by mouth every 12 (twelve) hours as needed for anxiety.   DULOXETINE (CYMBALTA) 60 MG CAPSULE    Take 1 capsule by mouth once daily   FERROUS SULFATE 325 (65 FE) MG TABLET    Take 1 tablet (325 mg total) by mouth 2 (two) times daily with a meal.   FOLIC ACID (FOLVITE) 1 MG TABLET    Take 2 tablets by mouth once daily   FUROSEMIDE (LASIX) 20 MG TABLET    Take 1 tablet (20 mg total) by mouth daily.   HYDROCODONE-ACETAMINOPHEN (NORCO/VICODIN) 5-325 MG TABLET    Take 1 tablet by mouth every 6 (six) hours as needed for moderate pain.   INSULIN GLARGINE (LANTUS SOLOSTAR) 100 UNIT/ML SOLOSTAR PEN    INJECT 20 UNITS SUBCUTANEOUSLY IN THE MORNING AND 40 AT NIGHT   MONTELUKAST (SINGULAIR) 10 MG TABLET    TAKE 1 TABLET BY MOUTH AT BEDTIME   MULTIPLE VITAMIN (MULTIVITAMIN WITH MINERALS) TABS TABLET    Take 1 tablet by mouth daily.   NONFORMULARY OR COMPOUNDED ITEM    Please dispense 1 L border of normal saline, ABD pads, Kerlix roll, 30 gauze pieces, 1 roll of paper tape for twice a day following dressing change, gently clean lower extremity wounds with Normal Saline, pat dry. Cover with dry dressings (ABD pads) and secure with Kerlix roll gauze/paper tape   ONDANSETRON (ZOFRAN) 8 MG TABLET    Take 0.5 tablets (4 mg total) by mouth every 8 (eight) hours as needed for nausea or vomiting.   PREGABALIN (LYRICA) 75 MG CAPSULE    TAKE 1 CAPSULE BY MOUTH THREE TIMES DAILY   PROPARACAINE (ALCAINE) 0.5 % OPHTHALMIC SOLUTION       VITAMIN D PO    Take 2 capsules by mouth daily.  Modified Medications   No medications on file  Discontinued Medications   CEFTRIAXONE (ROCEPHIN) 10 G INJECTION       CEFTRIAXONE (ROCEPHIN) IVPB    Inject 2 g into the vein daily. Indication:  B/L Foot Osteo First Dose: Yes Last Day of Therapy:  11/29/21 Labs - Once weekly:  CBC/D and  BMP, Labs - Every other week:  ESR and CRP Method of administration: IV Push Method of administration may be changed at the discretion of home infusion pharmacist based upon assessment of the patient and/or caregiver's ability to self-administer the medication ordered.   DOXYCYCLINE (VIBRAMYCIN) 100 MG CAPSULE    Take 1 capsule (100 mg total) by mouth 2 (two) times daily.    Subjective: I am is in for her routine follow-up visit.  I followed her for  many years for a variety of different infections.  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  [name] [name] developed a Pseudomonas vertebral infection that appears to have been cured.  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  [name] [name] has been treated for infection there on multiple occasions.  Her last visit with me was in November 2021.  [name] [name] was readmitted to the hospital in December with sepsis and streptococcal intermedius bacteremia.  Plain x-rays suggested deep infection of both feet.  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  [name] [name] refused bilateral BKA's.  [name] [name] was seen by my partners and discharged on IV ceftriaxone and oral doxycycline.  [name] [name] completed that yesterday.  [name] [name] has developed watery diarrhea since discharge and recurrent GI bleeding.  C. difficile testing was negative on 11/21/2021.  [name] [name] tells me that [name] [name] required 1 unit of blood in the hospital and 2 more units of blood about 2 weeks ago.  [name] [name] is scheduled to be reevaluated tomorrow at [facility] to see if [name] [name] needs further transfusions.  [name] [name] saw Dr. Dossie Der today at the wound center.  [name] [name] tells me that her husband had been encouraged but they felt like Dr. Dellia Nims did not feel like the wounds on her right foot looked very good.  [name] [name] is scheduled to follow-up with him weekly.  [name] [name] also has an appointment scheduled with podiatry on 12/15/2021.  Review of Systems: Review  of Systems  Constitutional:  Negative for chills, diaphoresis and fever.  Gastrointestinal:  Positive for abdominal pain, blood in stool, diarrhea and nausea. Negative for vomiting.  Musculoskeletal:  Negative for joint pain.   Past Medical History:  Diagnosis Date   Acute MI [facility]) 2007   presented to ED & had cardiac cath- but found to have normal coronaries. Since that point in time her PCP cares f or cardiac needs. Dr. 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   Retinopathy 04/17/2019   Sleep apnea 11/16/2017   SOB (shortness of breath) 10/11/2020   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   Qualifier: Diagnosis of  By: Charlott Holler CMA, [place]     Type 2 diabetes mellitus with  hyperglycemia, with long-term current use of insulin ([place]) 04/06/2017    Social History   Tobacco Use   Smoking status: Never   Smokeless tobacco: Never  Vaping Use   Vaping Use: Never used  Substance Use Topics   Alcohol use: No    Alcohol/week: 0.0 standard drinks   Drug use: No    Family History  Problem Relation Age of Onset   Heart disease Father    Diabetes Father    Colitis Father    Crohn's disease Father    Cancer Father        leukemia   Leukemia Father    Diabetes Mellitus II Brother    Kidney disease Brother    Heart disease Brother    Diabetes Mother    Hypertension Mother    Mental illness Mother    Irritable bowel syndrome Daughter    Diabetes Mellitus II Brother    Kidney disease Brother    Liver disease Brother    Kidney disease Brother    Heart attack Brother    Diabetes Mellitus II Brother    Heart disease Brother    Liver disease Brother    Kidney disease Brother    Kidney disease Brother    Diabetes Mellitus II Brother    Diabetes Mellitus I Brother     Allergies  Allergen Reactions   Bee Pollen Anaphylaxis   Fish-Derived Products Hives, Shortness Of Breath, Swelling and Rash    Hives get in throat causing trouble breathing   Mushroom Extract Complex Anaphylaxis   Penicillins Anaphylaxis    **Tolerated cefepime March 2021 Did it involve swelling of the face/tongue/throat, SOB, or low BP? Yes Did it involve sudden or severe rash/hives, skin peeling, or any reaction on the inside of your mouth or nose? No Did you need to seek medical attention at a hospital or doctor's office? Yes When did it last happen?  A few months ago If all above answers are "NO", may proceed with cephalosporin use.   Rosemary Oil Anaphylaxis   Shellfish Allergy Hives, Shortness Of Breath, Swelling and Rash   Tomato Hives and Shortness Of Breath    Hives in throat causes her trouble breathing   Acetaminophen Other (See Comments)    GI upset   Aloe Vera Hives    Broccoli [Brassica Oleracea] Hives   Acyclovir And Related Other (See Comments)    Unknown reaction   Naproxen Other (See Comments)    Unknown reaction    Objective: Vitals:   11/29/21 1518  BP: 124/71  Pulse: 66  Resp: 16  SpO2: 98%  Weight: 206 lb 11.2 oz (93.8 kg)  Height: 5' 5"  (1.651 m)   Body mass index is 34.4 kg/m.  Physical Exam Constitutional:      Comments: [name] [name] is seated in a wheelchair.  [name] [name] is accompanied by her husband who has pictures of her feet taken at the wound center today.  [name] [name] became tearful when talking about the possibility of BKA.  [name] [name] told me that [name] [name] is of [employer] and does not want to die without all of her body parts.  Cardiovascular:     Rate and Rhythm: Normal rate.  Pulmonary:     Effort:  Pulmonary effort is normal.  Musculoskeletal:     Comments: Both feet are wrapped with new dressings placed today at the wound center.    Lab Results    Problem List Items Addressed This Visit       High   Diabetic foot infection ([place])    I reviewed pictures taken today at the wound center.  [name] [name] has a large plantar ulcer under her left heel.  [name] [name] has a very shaggy ulcer on her right lateral foot that does not look very healthy.  I talked to them about the extreme difficulty of curing her complicated deep foot infections with antibiotics and wound care alone.  I also talked to her about the risk of not undergoing BKA including rehospitalization, worsening infection, need for amputations at higher levels and/or death.  [name] [name] has had multiple antibiotic allergies and intolerances limiting what might be effective in suppressing her current infection.  We will treat her with oral cefuroxime and metronidazole.  I will leave her central line in for now since [name] [name] may need further red blood cell transfusions in the near future.      Relevant Medications   cefUROXime (CEFTIN) 250 MG tablet   metroNIDAZOLE (FLAGYL) 500 MG tablet   Diarrhea    [name] [name] has  had C. difficile negative diarrhea while on antibiotics on multiple occasions in the past.      Streptococcal bacteremia    Her streptococcal bacteremia has been cured.  Blood cultures became negative while [name] [name] was in the hospital on antibiotic therapy.        Michel Bickers, MD [facility] for Infectious [place] [facility] [phone] pager   [phone] cell 11/29/2021, 4:32 PM

## 2021-11-29 NOTE — Assessment & Plan Note (Signed)
She has had C. difficile negative diarrhea while on antibiotics on multiple occasions in the past.

## 2021-11-29 NOTE — Assessment & Plan Note (Signed)
Her streptococcal bacteremia has been cured.  Blood cultures became negative while she was in the hospital on antibiotic therapy.

## 2021-11-29 NOTE — Progress Notes (Signed)
YADIRA, HADA ([id number]) Visit Report for 11/29/2021 Abuse Risk Screen Details Patient Name: Date of Service: [name], [name] 11/29/2021 1:15 PM Medical Record Number: [id number] Patient Account Number: [id number] Date of Birth/Sex: Treating RN: 10/16/1956 (66 y.o. Female) [name] Primary Care Elden Brucato: [name] Other Clinician: Referring Yanai Hobson: Treating Ludie Hudon/Extender: Cheree Ditto in Treatment: 0 Abuse Risk Screen Items Answer ABUSE RISK SCREEN: Has anyone close to you tried to hurt or harm you recentlyo No Do you feel uncomfortable with anyone in your familyo No Has anyone forced you do things that you didnt want to doo No Electronic Signature(s) Signed: 11/29/2021 5:37:15 PM By: [name] RN, BSN Entered By: [name] on 11/29/2021 13:24:27 -------------------------------------------------------------------------------- Activities of Daily Living Details Patient Name: Date of Service: [name], [name] 11/29/2021 1:15 PM Medical Record Number: [id number] Patient Account Number: [id number] Date of Birth/Sex: Treating RN: 20-Nov-1955 (66 y.o. Female) [name] Primary Care Nadine Ryle: [name] Other Clinician: Referring Anella Nakata: Treating Jaiya Mooradian/Extender: Cheree Ditto in Treatment: 0 Activities of Daily Living Items Answer Activities of Daily Living (Please select one for each item) Drive Automobile Not Able T Medications ake Completely Able Use T elephone Completely Able Care for Appearance Need Assistance Use T oilet Need Assistance Bath / Shower Need Assistance Dress Self Need Assistance Feed Self Completely Able Walk Need Assistance Get In / Out Bed Need Assistance Housework Need Assistance Prepare Meals Need Assistance Handle Money Need Assistance Shop for Self Need Assistance Electronic Signature(s) Signed: 11/29/2021 5:37:15 PM By: [name] RN, BSN Entered By: [name] on  11/29/2021 13:25:28 -------------------------------------------------------------------------------- Education Screening Details Patient Name: Date of Service: [name], [name] 11/29/2021 1:15 PM Medical Record Number: [id number] Patient Account Number: [id number] Date of Birth/Sex: Treating RN: [birth year] (66 y.o. Female) [name] Primary Care Aneesh Faller: [name] Other Clinician: Referring Parsa Rickett: Treating Ahan Eisenberger/Extender: Cheree Ditto in Treatment: 0 Primary Learner Assessed: Patient Learning Preferences/Education Level/Primary Language Learning Preference: Explanation, Demonstration, Printed Material Highest Education Level: College or Above Preferred Language: English Cognitive Barrier Language Barrier: No Translator Needed: No Memory Deficit: No Emotional Barrier: No Cultural/Religious Beliefs Affecting Medical Care: No Physical Barrier Impaired Vision: No Impaired Hearing: No Decreased Hand dexterity: No Knowledge/Comprehension Knowledge Level: High Comprehension Level: High Ability to understand written instructions: High Ability to understand verbal instructions: High Motivation Anxiety Level: Calm Cooperation: Cooperative Education Importance: Acknowledges Need Interest in Health Problems: Asks Questions Perception: Coherent Willingness to Engage in Self-Management High Activities: Readiness to Engage in Self-Management High Activities: Electronic Signature(s) Signed: 11/29/2021 5:37:15 PM By: [name] RN, BSN Entered By: [name] on 11/29/2021 13:25:56 -------------------------------------------------------------------------------- Fall Risk Assessment Details Patient Name: Date of Service: [name], [name] 11/29/2021 1:15 PM Medical Record Number: [id number] Patient Account Number: [id number] Date of Birth/Sex: Treating RN: 1956/04/15 (66 y.o. Female) [name] Primary Care Eunice Winecoff: [name] Other Clinician: Referring Zykira Matlack: Treating Elvis Laufer/Extender: Cheree Ditto in Treatment: 0 Fall Risk Assessment Items Have you had 2 or more falls in the last 12 monthso 0 Yes Have you had any fall that resulted in injury in the last 12 monthso 0 Yes FALLS RISK SCREEN History of falling - immediate or within 3 months 25 Yes Secondary diagnosis (Do you have 2 or more medical diagnoseso) 0 No Ambulatory aid None/bed rest/wheelchair/nurse 0 Yes Crutches/cane/walker 0 No Furniture 0 No Intravenous therapy Access/Saline/Heparin Lock 0 No Gait/Transferring Normal/ bed rest/ wheelchair 0 No Weak (short steps  with or without shuffle, stooped but able to lift head while walking, may seek 10 Yes support from furniture) Impaired (short steps with shuffle, may have difficulty arising from chair, head down, impaired 0 No balance) Mental Status Oriented to own ability 0 Yes Electronic Signature(s) Signed: 11/29/2021 5:37:15 PM By: [name] RN, BSN Entered By: [name] on 11/29/2021 13:26:13 -------------------------------------------------------------------------------- Foot Assessment Details Patient Name: Date of Service: [name], [name] 11/29/2021 1:15 PM Medical Record Number: [id number] Patient Account Number: [id number] Date of Birth/Sex: Treating RN: 1955/12/04 (66 y.o. Female) [name] Primary Care Walker Sitar: [name] Other Clinician: Referring Achillies Buehl: Treating Rick Warnick/Extender: Cheree Ditto in Treatment: 0 Foot Assessment Items Site Locations + = Sensation present, - = Sensation absent, C = Callus, U = Ulcer R = Redness, W = Warmth, M = Maceration, PU = Pre-ulcerative lesion F = Fissure, S = Swelling, D = Dryness Assessment Right: Left: Other Deformity: No No Prior Foot Ulcer: No No Prior Amputation: No No Charcot Joint: No No Ambulatory Status: Ambulatory With Help Assistance Device: Wheelchair Gait:  Administrator, arts) Signed: 11/29/2021 5:37:15 PM By: [name] RN, BSN Entered By: [name] on 11/29/2021 13:27:15 -------------------------------------------------------------------------------- Nutrition Risk Screening Details Patient Name: Date of Service: [name], [name] 11/29/2021 1:15 PM Medical Record Number: [id number] Patient Account Number: [id number] Date of Birth/Sex: Treating RN: Aug 26, 1956 (66 y.o. Female) [name] Primary Care Carmelia Tiner: [name] Other Clinician: Referring Ali Mohl: Treating Fidela Cieslak/Extender: Cheree Ditto in Treatment: 0 Height (in): 61 Weight (lbs): 169 Body Mass Index (BMI): 31.9 Nutrition Risk Screening Items Score Screening NUTRITION RISK SCREEN: I have an illness or condition that made me change the kind and/or amount of food I eat 0 No I eat fewer than two meals per day 0 No I eat few fruits and vegetables, or milk products 0 No I have three or more drinks of beer, liquor or wine almost every day 0 No I have tooth or mouth problems that make it hard for me to eat 0 No I don't always have enough money to buy the food I need 0 No I eat alone most of the time 0 No I take three or more different prescribed or over-the-counter drugs a day 1 Yes Without wanting to, I have lost or gained 10 pounds in the last six months 0 No I am not always physically able to shop, cook and/or feed myself 2 Yes Nutrition Protocols Good Risk Protocol Moderate Risk Protocol 0 Provide education on nutrition High Risk Proctocol Risk Level: Moderate Risk Score: 3 Electronic Signature(s) Signed: 11/29/2021 5:37:15 PM By: [name] RN, BSN Entered By: [name] on 11/29/2021 13:26:23

## 2021-11-29 NOTE — Assessment & Plan Note (Signed)
I reviewed pictures taken today at the wound center.  She has a large plantar ulcer under her left heel.  She has a very shaggy ulcer on her right lateral foot that does not look very healthy.  I talked to them about the extreme difficulty of curing her complicated deep foot infections with antibiotics and wound care alone.  I also talked to her about the risk of not undergoing BKA including rehospitalization, worsening infection, need for amputations at higher levels and/or death.  She has had multiple antibiotic allergies and intolerances limiting what might be effective in suppressing her current infection.  We will treat her with oral cefuroxime and metronidazole.  I will leave her central line in for now since she may need further red blood cell transfusions in the near future.

## 2021-11-29 NOTE — Telephone Encounter (Signed)
Notified [facility] that IV abx have been stopped. Per Dr Megan Salon patient will start oral abx. Picc line will remain as she is to get blood transfusion tomorrow. I notified Cassie of this change as well. Patient will call [facility] to updat eif she had picc pulled after transfusion. If not we will contact [place].

## 2021-11-30 ENCOUNTER — Inpatient Hospital Stay: Payer: PRIVATE HEALTH INSURANCE | Attending: Hematology & Oncology

## 2021-11-30 ENCOUNTER — Telehealth: Payer: Self-pay

## 2021-11-30 ENCOUNTER — Telehealth: Payer: Self-pay | Admitting: *Deleted

## 2021-11-30 ENCOUNTER — Other Ambulatory Visit: Payer: Self-pay | Admitting: *Deleted

## 2021-11-30 DIAGNOSIS — D631 Anemia in chronic kidney disease: Secondary | ICD-10-CM | POA: Diagnosis present

## 2021-11-30 DIAGNOSIS — E119 Type 2 diabetes mellitus without complications: Secondary | ICD-10-CM | POA: Insufficient documentation

## 2021-11-30 DIAGNOSIS — E8771 Transfusion associated circulatory overload: Secondary | ICD-10-CM | POA: Insufficient documentation

## 2021-11-30 DIAGNOSIS — D500 Iron deficiency anemia secondary to blood loss (chronic): Secondary | ICD-10-CM

## 2021-11-30 DIAGNOSIS — K769 Liver disease, unspecified: Secondary | ICD-10-CM | POA: Diagnosis not present

## 2021-11-30 DIAGNOSIS — I10 Essential (primary) hypertension: Secondary | ICD-10-CM | POA: Diagnosis not present

## 2021-11-30 DIAGNOSIS — D509 Iron deficiency anemia, unspecified: Secondary | ICD-10-CM | POA: Diagnosis not present

## 2021-11-30 DIAGNOSIS — N183 Chronic kidney disease, stage 3 unspecified: Secondary | ICD-10-CM | POA: Insufficient documentation

## 2021-11-30 DIAGNOSIS — Z86718 Personal history of other venous thrombosis and embolism: Secondary | ICD-10-CM | POA: Insufficient documentation

## 2021-11-30 DIAGNOSIS — D649 Anemia, unspecified: Secondary | ICD-10-CM

## 2021-11-30 DIAGNOSIS — R0602 Shortness of breath: Secondary | ICD-10-CM | POA: Insufficient documentation

## 2021-11-30 LAB — CBC WITH DIFFERENTIAL (CANCER CENTER ONLY)
Abs Immature Granulocytes: 0.02 10*3/uL (ref 0.00–0.07)
Basophils Absolute: 0 10*3/uL (ref 0.0–0.1)
Basophils Relative: 0 %
Eosinophils Absolute: 0.1 10*3/uL (ref 0.0–0.5)
Eosinophils Relative: 2 %
HCT: 21.9 % — ABNORMAL LOW (ref 36.0–46.0)
Hemoglobin: 6.6 g/dL — CL (ref 12.0–15.0)
Immature Granulocytes: 1 %
Lymphocytes Relative: 18 %
Lymphs Abs: 0.5 10*3/uL — ABNORMAL LOW (ref 0.7–4.0)
MCH: 26.6 pg (ref 26.0–34.0)
MCHC: 30.1 g/dL (ref 30.0–36.0)
MCV: 88.3 fL (ref 80.0–100.0)
Monocytes Absolute: 0.2 10*3/uL (ref 0.1–1.0)
Monocytes Relative: 8 %
Neutro Abs: 2 10*3/uL (ref 1.7–7.7)
Neutrophils Relative %: 71 %
Platelet Count: 114 10*3/uL — ABNORMAL LOW (ref 150–400)
RBC: 2.48 MIL/uL — ABNORMAL LOW (ref 3.87–5.11)
RDW: 17.6 % — ABNORMAL HIGH (ref 11.5–15.5)
WBC Count: 2.9 10*3/uL — ABNORMAL LOW (ref 4.0–10.5)
nRBC: 0 % (ref 0.0–0.2)

## 2021-11-30 LAB — SAMPLE TO BLOOD BANK

## 2021-11-30 LAB — CMP (CANCER CENTER ONLY)
ALT: 7 U/L (ref 0–44)
AST: 21 U/L (ref 15–41)
Albumin: 2.5 g/dL — ABNORMAL LOW (ref 3.5–5.0)
Alkaline Phosphatase: 87 U/L (ref 38–126)
Anion gap: 9 (ref 5–15)
BUN: 56 mg/dL — ABNORMAL HIGH (ref 8–23)
CO2: 23 mmol/L (ref 22–32)
Calcium: 8 mg/dL — ABNORMAL LOW (ref 8.9–10.3)
Chloride: 106 mmol/L (ref 98–111)
Creatinine: 3.58 mg/dL (ref 0.44–1.00)
GFR, Estimated: 14 mL/min — ABNORMAL LOW (ref >60)
Glucose, Bld: 83 mg/dL (ref 70–99)
Potassium: 4.5 mmol/L (ref 3.5–5.1)
Sodium: 138 mmol/L (ref 135–145)
Total Bilirubin: 0.5 mg/dL (ref 0.3–1.2)
Total Protein: 7.2 g/dL (ref 6.5–8.1)

## 2021-11-30 LAB — PREPARE RBC (CROSSMATCH)

## 2021-11-30 LAB — RETICULOCYTES
Immature Retic Fract: 15.2 % (ref 2.3–15.9)
RBC.: 2.47 MIL/uL — ABNORMAL LOW (ref 3.87–5.11)
Retic Count, Absolute: 37.1 10*3/uL (ref 19.0–186.0)
Retic Ct Pct: 1.5 % (ref 0.4–3.1)

## 2021-11-30 NOTE — Progress Notes (Signed)
[name] [name] ([id number]) Visit Report for 11/29/2021 Biopsy Details Patient Name: Date of Service: [name] [name], [name] 11/29/2021 1:15 PM Medical Record Number: [id number] Patient Account Number: [id number] Date of Birth/Sex: Treating RN: 1956-10-25 (66 y.o. Female) [name] Primary Care Provider: [name] Other Clinician: Referring Provider: Treating Provider/Extender: Cheree Ditto in Treatment: 0 Biopsy Performed for: Wound #13 Right, Medial Foot Location(s): Wound Bed Performed By: Physician Ricard Dillon., MD Tissue Punch: No Number of Specimens T aken: 1 Specimen Sent T Pathology: o Yes Level of Consciousness (Pre-procedure): Awake and Alert Pre-procedure Verification/Time-Out Taken: Yes - 13:55 Instrument: Forceps, Scissors Bleeding: Moderate Hemostasis Achieved: Silver Nitrate Procedural Pain: 0 Post Procedural Pain: 0 Response to Treatment: Procedure was tolerated well Level of Consciousness (Post-procedure): Awake and Alert Post Procedure Diagnosis Same as Pre-procedure Electronic Signature(s) Signed: 11/29/2021 4:28:46 PM By: Linton Ham MD Entered By: Linton Ham on 11/29/2021 14:13:04 -------------------------------------------------------------------------------- Chief Complaint Document Details Patient Name: Date of Service: [name] [name], [name] 11/29/2021 1:15 PM Medical Record Number: [id number] Patient Account Number: [id number] Date of Birth/Sex: Treating RN: [birth year] (66 y.o. Female) [name] Primary Care Provider: [name] Other Clinician: Referring Provider: Treating Provider/Extender: Cheree Ditto in Treatment: 0 Information Obtained from: Patient Chief Complaint Left heel ulcer 01/10/2021; patient returns to clinic with 2 large wounds on her bilateral heels 11/29/2021; patient returns to clinic with substantial wounds on the right foot and left heel. Electronic Signature(s) Signed:  11/29/2021 4:28:46 PM By: Linton Ham MD Entered By: Linton Ham on 11/29/2021 14:12:26 -------------------------------------------------------------------------------- Debridement Details Patient Name: Date of Service: [name] [name] RE, [name] 11/29/2021 1:15 PM Medical Record Number: [id number] Patient Account Number: [id number] Date of Birth/Sex: Treating RN: [birth year] (66 y.o. Female) [name] Primary Care Provider: [name] Other Clinician: Referring Provider: Treating Provider/Extender: Cheree Ditto in Treatment: 0 Debridement Performed for Assessment: Wound #11 Left Calcaneus Performed By: Physician Ricard Dillon., MD Debridement Type: Debridement Severity of Tissue Pre Debridement: Fat layer exposed Level of Consciousness (Pre-procedure): Awake and Alert Pre-procedure Verification/Time Out Yes - 13:55 Taken: Start Time: 13:55 T Area Debrided (L x W): otal 4.4 (cm) x 3.2 (cm) = 14.08 (cm) Tissue and other material debrided: Viable, Non-Viable, Slough, Subcutaneous, Slough Level: Skin/Subcutaneous Tissue Debridement Description: Excisional Instrument: Curette Bleeding: Minimum Hemostasis Achieved: Pressure End Time: 13:57 Procedural Pain: 0 Post Procedural Pain: 0 Response to Treatment: Procedure was tolerated well Level of Consciousness (Post- Awake and Alert procedure): Post Debridement Measurements of Total Wound Length: (cm) 4.4 Width: (cm) 3.2 Depth: (cm) 1 Volume: (cm) 11.058 Character of Wound/Ulcer Post Debridement: Requires Further Debridement Severity of Tissue Post Debridement: Fat layer exposed Post Procedure Diagnosis Same as Pre-procedure Electronic Signature(s) Signed: 11/29/2021 4:28:46 PM By: Linton Ham MD Signed: 11/29/2021 5:37:15 PM By: [name] RN, BSN Entered By: Linton Ham on 11/29/2021 14:12:40 -------------------------------------------------------------------------------- Debridement  Details Patient Name: Date of Service: [name] [name], [name] 11/29/2021 1:15 PM Medical Record Number: [id number] Patient Account Number: [id number] Date of Birth/Sex: Treating RN: [birth year] (66 y.o. Female) [name] Primary Care Provider: [name] Other Clinician: Referring Provider: Treating Provider/Extender: Cheree Ditto in Treatment: 0 Debridement Performed for Assessment: Wound #12 Right,Plantar Foot Performed By: Physician Ricard Dillon., MD Debridement Type: Debridement Severity of Tissue Pre Debridement: Fat layer exposed Level of Consciousness (Pre-procedure): Awake and Alert Pre-procedure Verification/Time Out Yes - 13:55 Taken: Start Time: 13:55 T Area Debrided (L x W): otal 1.5 (cm)  x 1.5 (cm) = 2.25 (cm) Tissue and other material debrided: Viable, Non-Viable, Slough, Subcutaneous, Slough Level: Skin/Subcutaneous Tissue Debridement Description: Excisional Instrument: Curette Bleeding: Minimum Hemostasis Achieved: Pressure End Time: 13:57 Procedural Pain: 0 Post Procedural Pain: 0 Response to Treatment: Procedure was tolerated well Level of Consciousness (Post- Awake and Alert procedure): Post Debridement Measurements of Total Wound Length: (cm) 1.5 Width: (cm) 1.5 Depth: (cm) 1.2 Volume: (cm) 2.121 Character of Wound/Ulcer Post Debridement: Requires Further Debridement Severity of Tissue Post Debridement: Fat layer exposed Post Procedure Diagnosis Same as Pre-procedure Electronic Signature(s) Signed: 11/29/2021 4:28:46 PM By: Linton Ham MD Signed: 11/29/2021 5:37:15 PM By: [name] RN, BSN Entered By: Linton Ham on 11/29/2021 14:12:49 -------------------------------------------------------------------------------- Debridement Details Patient Name: Date of Service: [name] [name], [name] 11/29/2021 1:15 PM Medical Record Number: [id number] Patient Account Number: [id number] Date of Birth/Sex: Treating  RN: [birth year] (66 y.o. Female) [name] Primary Care Provider: [name] Other Clinician: Referring Provider: Treating Provider/Extender: Cheree Ditto in Treatment: 0 Debridement Performed for Assessment: Wound #14 Right,Proximal,Lateral Foot Performed By: Physician Ricard Dillon., MD Debridement Type: Debridement Severity of Tissue Pre Debridement: Fat layer exposed Level of Consciousness (Pre-procedure): Awake and Alert Pre-procedure Verification/Time Out Yes - 13:55 Taken: Start Time: 13:55 T Area Debrided (L x W): otal 0.7 (cm) x 1.6 (cm) = 1.12 (cm) Tissue and other material debrided: Viable, Non-Viable, Slough, Subcutaneous, Slough Level: Skin/Subcutaneous Tissue Debridement Description: Excisional Instrument: Curette Bleeding: Minimum Hemostasis Achieved: Pressure End Time: 13:57 Procedural Pain: 0 Post Procedural Pain: 0 Response to Treatment: Procedure was tolerated well Level of Consciousness (Post- Awake and Alert procedure): Post Debridement Measurements of Total Wound Length: (cm) 0.7 Width: (cm) 1.6 Depth: (cm) 0.2 Volume: (cm) 0.176 Character of Wound/Ulcer Post Debridement: Requires Further Debridement Severity of Tissue Post Debridement: Fat layer exposed Post Procedure Diagnosis Same as Pre-procedure Electronic Signature(s) Signed: 11/29/2021 4:28:46 PM By: Linton Ham MD Signed: 11/29/2021 5:37:15 PM By: [name] RN, BSN Entered By: Linton Ham on 11/29/2021 14:13:14 -------------------------------------------------------------------------------- Debridement Details Patient Name: Date of Service: [name] [name], [name] 11/29/2021 1:15 PM Medical Record Number: [id number] Patient Account Number: [id number] Date of Birth/Sex: Treating RN: 09-Oct-1956 (66 y.o. Female) [name] Primary Care Provider: [name] Other Clinician: Referring Provider: Treating Provider/Extender: Cheree Ditto  in Treatment: 0 Debridement Performed for Assessment: Wound #15 Right,Distal,Lateral Foot Performed By: Physician Ricard Dillon., MD Debridement Type: Debridement Severity of Tissue Pre Debridement: Fat layer exposed Level of Consciousness (Pre-procedure): Awake and Alert Pre-procedure Verification/Time Out Yes - 13:55 Taken: Start Time: 13:55 T Area Debrided (L x W): otal 1.6 (cm) x 2.2 (cm) = 3.52 (cm) Tissue and other material debrided: Viable, Non-Viable, Slough, Subcutaneous, Slough Level: Skin/Subcutaneous Tissue Debridement Description: Excisional Instrument: Curette Bleeding: Minimum Hemostasis Achieved: Pressure End Time: 13:57 Procedural Pain: 0 Post Procedural Pain: 0 Response to Treatment: Procedure was tolerated well Level of Consciousness (Post- Awake and Alert procedure): Post Debridement Measurements of Total Wound Length: (cm) 1.6 Width: (cm) 2.2 Depth: (cm) 1 Volume: (cm) 2.765 Character of Wound/Ulcer Post Debridement: Requires Further Debridement Severity of Tissue Post Debridement: Fat layer exposed Post Procedure Diagnosis Same as Pre-procedure Electronic Signature(s) Signed: 11/29/2021 4:28:46 PM By: Linton Ham MD Signed: 11/29/2021 5:37:15 PM By: [name] RN, BSN Entered By: Linton Ham on 11/29/2021 14:13:29 -------------------------------------------------------------------------------- HPI Details Patient Name: Date of Service: [name] [name], [name] 11/29/2021 1:15 PM Medical Record Number: [id number] Patient Account Number: [id number] Date of Birth/Sex:  Treating RN: 05-08-1956 (66 y.o. Female) [name] Primary Care Provider: [name] Other Clinician: Referring Provider: Treating Provider/Extender: Cheree Ditto in Treatment: 0 History of Present Illness HPI Description: ADMISSION 11/12/2018 This is a 66 year old woman with type 2 diabetes and diabetic neuropathy. She has been dealing with a left heel  plantar wound for roughly 2 years. She states this started when she pulled some skin off the area and it progressed into a wound. She also has an area on the tip of her left great toe for 1 year. She has been largely followed by Dr. Sharol Given and she had an excision of bone in the left heel in 2017. I do not see microbiology from this excision or pathology. Apparently this wound never really closed. She most recently has been using Silvadene cream. Offloading this with a scooter. The last MRI I see was in June 2018 which did not show osteomyelitis at that time. Apparently this wound is never really progressed towards healing. During her last review by Dr. Sharol Given in October it was recommended that she undergo a left BKA and she refused. She went to see a second orthopedic consult at [facility] who am recommended conservative wound care to see if this will close or progress towards closure but also warned that possible surgery may be necessary. An x-ray that was done at [facility] showed an irregular calcaneal body and calcaneal tuberosity. This is probably postprocedural. Previous x-rays at [facility] had suggested heterotrophic calcifications but I do not see this. Apparently a second ointment was added to the Silvadene which the patient thinks is because some improvement. She has not been systemically unwell. No fever or chills. She thinks the second ointment that was given to her at [facility] has helped somewhat. 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 ABIs done in June 2018 showed a ABI of 1.12 on the right and 1.1 on the left she was biphasic bilaterally. ABIs in our clinic were noncompressible today. 11/20/18 on  evaluation today patient is seen for her second visit here in the office although this is actually the first visit with me concerning an issue that she's having with her great toe and heel of the left foot. Fortunately she does not appear to be have any discomfort at this time and she does have a scooter in order to offload her foot she also has a boot for offloading. With that being said this is something that appears to have been going on for some time she was seeing Dr. due to his recommendation was that she was going to require a below knee amputation. With that being said she wanted to come to the wound center but according to the patient Dr. Sharol Given told her that "we could not help her". Nonetheless she saw another provider who suggested that it may be worth a shot for [name] to try and help her out if it all possible. Nonetheless she decided that it would be worth a trial since otherwise any the way she's gonna end up with an amputation. Obviously if we can heal the wound then that will not be the case. Again I explained to her that obviously there are no guarantees but we  will give this a good try and attempts to get the wound to heal. 1/30; the patient continues to have areas on the left plantar heel and the left plantar great toe. The more worrisome area is the heel. She went for MRI on Saturday but this could not be done because she had silver alginate in the wound bed. I would like to get this rebooked. If she does not have osteomyelitis she will need a total contact cast. We have been using silver alginate in the wounds 2/7; patient has wounds on her left plantar heel and left plantar great toe. The area on the heel has some depth although it looks about the same today. Her MRI will finally be done tomorrow. If she has osteomyelitis in the heel and then we will need to consider her for IV antibiotics and hyperbaric oxygen. If the MRI is negative she will need a total contact cast although she is  wearing a cam boot and using a scooter at present. Will be using silver alginate. She will take it off tomorrow in preparation for the MRI 2/14; the patient's MRI showed extensive surgical changes with a large portion of her calcaneus removed on the left secondary to her previous surgery by Dr. Sharol Given however there is no evidence of osteomyelitis. She would therefore be a candidate for a total contact cast and we applied this for the first time today 2/21; silver collagen total contact cast. The area on the plantar left great toe is "healed" still a lot of callus on this area. Dimensions on the plantar heel not too much different some epithelialization is present however. This is an improvement 12/25/18 on evaluation today patient actually is seen for follow-up concerning issues that she has been having with her cast she states she feels like it's wet and squishy in the bottom of her cast. The reason she does come in to have this evaluated and see what is going on. Fortunately there's no signs of infection at this time was the cast was removed. 3/6; the patient's area on the tip of her right great toe is callused but there is no open area here. She still has the fairly extensive area in the left heel. We have been using silver collagen in the wound. 01/08/19 on evaluation today patient actually appears to be doing very well in regard to her heel ulcer in my pinion to see if you shown signs of improvement which is excellent news. There's no evidence of infection. She continues to have quite a bit of drainage but fortunately again this doesn't seem to be hindering her healing. 3/20 -Patient's foot ulcer on the left appears to be doing well, the dimensions appear encouraging, we have been using total contact cast with Prisma and will continue doing that 3/27; patient continues to make nice progress on the left plantar heel. She is using silver collagen under a total contact cast. It is been a while since I  have seen this wound and it really looks a lot better. Smaller with healthy granulation 4/3; she continues to make nice progress on the left plantar heel. Using silver collagen under a total contact cast 4/10; left plantar heel. Again skin over the surface of the wound with not much in the way of adherence. This results in undermining. Using silver collagen changed to silver alginate 4/17 left plantar heel. Again not much improvement. There is no undermining today no debridement was required we used silver alginate last time because of excessive  moisture 4/24 left plantar heel. Again not as much improvement as I would have liked. About 3 mm of depth. Thick callused skin around the circumference. Using silver alginate 5/1; left plantar heel this is improved this week. Less depth epithelialization is present. Using silver alginate alginate under a total contact cast 5/8; left plantar heel dimensions are about the same. The depth appears to be improved. I use silver collagen starting today under the cast 5/15 left plantar heel. Arrives today with a 2-day history of feeling like something had "slipped" while walking her dog in the cast. Unfortunately extensive area relatively to the small wound of undermining superiorly denuded epithelium. 5/22-Patient returns at 1 week after being taken off the TCC on account of some fluid collection that was debrided and cultured at last visit from the plantar ulcer on the left heel. The culture results are polymicrobial with Klebsiella, enterococcus, Proteus growth these organisms are sensitive to Cipro except with enterococcus which is sensitive to ampicillin but patient is highly allergic to penicillin according to her. 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 5/29; the patient arrived last week with a new wound on her left plantar great toe. With regards to the culture that I  did 2 weeks ago of her deteriorating heel wound this grew Klebsiella and enterococcus. She was given Cipro however the enterococcus would not be covered well by a quinolone. She is allergic to penicillin. I will give her linezolid 600 twice daily x5 days 6/12; the patient continues to have a wound on her left and after she returns to the beach there is undermining laterally. She has the new wound from this 2 weeks or so ago on the plantar tip of her left great toe. She had a fall today scraping the dorsal surface of the left fifth 7/14; READMISSION since the patient was last here she was hospitalized from 04/15/2019 through 04/22/2019. She had presented to an outside ER after falling and hitting her elbow. She had had previous surgery on the elbow and fractures several years ago. An x-ray was negative and she was sent home. She is readmitted with sepsis and acute renal failure secondary to septic arthritis of the elbow. Cultures apparently showed pansensitive staph aureus however she has a severe beta-lactam allergy. She was treated with vancomycin. Apparently the vancomycin is completed and her PICC line is removed although she is seeing Dr. Megan Salon tomorrow due to continued pain and swelling. In the hospital she had an IandD by Dr. [name] of orthopedics. The original surgery was on 04/17/2019 and it was felt that she had septic arthritis. She is still having a lot of pain and swelling in the right elbow and apparently is due to see Dr. Megan Salon tomorrow and what she thinks is that she will be restarted on antibiotics. With regards to her heel wounds/left foot wounds. Her arterial studies were checked and her ABIs were within normal limits. X-ray showed plantar foot ulcer negative for osteomyelitis postop resection of the posterior calcaneus. She has been using silver alginate to the heel 8/4-Patient returns after being seen on 7/14, we are using silver alginate to the calcaneal wound, she is  continuing to receive care for her right elbow septic arthritis 8/14- Patient returns after 1 week, she is being seen by the surgical group for her right elbow, she is here for the left calcaneal wound for which we are using silver  alginate this is about the same 8/21; this is a patient I readmitted to the clinic 5 weeks ago. She is continuing to have difficulties with a septic arthritis of the right elbow she had after a fall and apparently has had surgical IandD's since the last time we saw her. She is also following with Dr. Megan Salon of infectious disease and is apparently on 3 oral antibiotics although at the time of this dictation I am not sure what they are. She comes in today with a necrotic surface on the left great toe with a blister laterally. This was still clearly an open wound. She had purulent drainage coming out of this. The original wound on the plantar aspect of her right heel in the setting of a Charcot foot is deep not open to bone but certainly not any better at all. We have been using silver alginate 8/28; dealing with septic arthritis of the right elbow. May need to go for further surgery here in the second week of September. She had a blister on the left great toe that was purulent [place] draining last week although a culture did not grow anything [already on antibiotics through infectious disease for her elbow]. The punched-out area on her heel is just like it was when she first came in. This almost closed with a total contact cast there are no options for that now. The patient states she cannot stay off her foot having to do housework X-ray of the foot showed a moderate bunion and severe degenerative changes at the first metatarsal phalangeal joint there was a moderate plantar calcaneal spur. We will need to check the location of this. 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 She is apparently on a 3 times daily medication  for her MRSA I will need to see what that is. It is oral. We are using silver collagen to the punched-out area on her left heel and to the summer superficial area of the left great toe. She is offloading this is best she can although judging by the amount of callus it is not enough. She thinks she has enough strength in her right elbow now to use her scooter. 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 Hale Bogus notes and infectious disease. We are using silver collagen to the punched-out areas on her left heel and to the plantar aspect of her left great toe she is now using her scooter to get around and to help offload these areas 10/2; 2-week follow-up. Still on clindamycin I believe for the left elbow she is going for an MRI of the elbow over the weekend. She is then going to see orthopedics and infectious disease. The area on the left heel is certainly no better. This does not probe to bone however there is green drainage. She has an area on the tip of her toe. The area on the heel is in a wound we almost closed at one point with total contact casting. 10/9; one-week follow-up. Culture I did of the heel last week which was a swab culture admittedly showed moderate Enterococcus faecalis moderate Pseudomonas. The wound itself looks about the same. There is no palpable bone although the depth of it closely approximates bone. The heel is swollen but not erythematous.  Her MRI is booked for next week. She also has an MRI of the right elbow. I've also lifted the last consult from Dr. Megan Salon who is following her for septic arthritis of the right elbow. I did not see him specifically comment on her left heel however the patient states that he is aware of this. I did not specifically address the organisms I cultured last week because of  the possible effect of any additional cultures that will be done on the elbow. Additionally these were superficial not bone cultures 10/16; her MRI of the heel was put back to next week. She did have her elbow done. I have been in contact with Dr. Megan Salon about my concerns about osteomyelitis of the left heel. We have been using collagen. She also has a wound on the plantar tip of her first toe 10/23; her MRI of the heel was negative for osteomyelitis but suggested a small abscess. She also had an MRI of her elbow that showed findings compatible with a septic joint. She went to see Dr. Megan Salon and she is going to have both oral and IV antibiotics which I am sure will cover any infection in the heel. I did not actually see his note today. We are using silver collagen offloading the heel with a heel offloading boot 11/6; patient continues with silver collagen to both wound areas on her plantar left heel and plantar left great toe. She remains on daptomycin by Dr. Megan Salon of infectious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 [id number] Patient Account Number: [id number] Date of Birth/Sex: Treating RN:  Oct 23, 1956 (66  y.o. Female) [name] Primary Care Provider: [name] Other Clinician: Referring Provider: Treating Provider/Extender: Cheree Ditto in Treatment: 0 Subjective Chief Complaint Information obtained from Patient Left heel ulcer 01/10/2021; patient returns to clinic with 2 large wounds on her bilateral heels 11/29/2021; patient returns to clinic with substantial wounds on the right foot and left heel. History of Present Illness (HPI) ADMISSION 11/12/2018 This is a 66 year old woman with type 2 diabetes and diabetic neuropathy. She has been dealing with a left heel plantar wound for roughly 2 years. She states this started when she pulled some skin off the area and it progressed into a wound. She also has an area on the tip of her left great toe for 1 year. She has been largely followed by Dr. Sharol Given and she had an excision of bone in the left heel in 2017. I do not see microbiology from this excision or pathology. Apparently this wound never really closed. She most recently has been using Silvadene cream. Offloading this with a scooter. The last MRI I see was in June 2018 which did not show osteomyelitis at that time. Apparently this wound is never really progressed towards healing. During her last review by Dr. Sharol Given in October it was recommended that she undergo a left BKA and she refused. She went to see a second orthopedic consult at [facility] who am recommended conservative wound care to see if this will close or progress towards closure but also warned that possible surgery may be necessary. An x-ray that was done at [facility] showed an irregular calcaneal body and calcaneal tuberosity. This is probably postprocedural. Previous x-rays at [facility] had suggested heterotrophic calcifications but I do not see this. Apparently a second ointment was added to the Silvadene which the patient thinks is because some improvement. She has not been systemically unwell. No fever or chills.  She thinks the second ointment that was given to her at [facility] has helped somewhat. 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 ABIs done in June 2018 showed a ABI of 1.12 on the right and 1.1 on the left she was biphasic bilaterally. ABIs in our clinic were noncompressible today. 11/20/18 on evaluation today patient is seen for her second visit here in the office although this is actually the first visit with me concerning an issue that she's having with her great toe and heel of the left foot. Fortunately she does not appear to be have any discomfort at this time and she does have a scooter in order to offload her foot she also has a boot for offloading. With that being said this is something that appears to have been going on for some time she was seeing Dr. due to his recommendation was that she was going to require a below knee amputation. With that being said she wanted to come to the wound center but according to the patient Dr. Sharol Given told her that "we could not help her". Nonetheless she saw another provider who suggested that it may be worth a shot for [name] to try and help her out if it all possible. Nonetheless she decided that it would be worth a trial since otherwise any the  way she's gonna end up with an amputation. Obviously if we can heal the wound then that will not be the case. Again I explained to her that obviously there are no guarantees but we will give this a good try and attempts to get the wound to heal. 1/30; the patient continues to have areas on the left plantar heel and the left plantar great toe. The more worrisome area is the heel. She went for MRI on Saturday but this could  not be done because she had silver alginate in the wound bed. I would like to get this rebooked. If she does not have osteomyelitis she will need a total contact cast. We have been using silver alginate in the wounds 2/7; patient has wounds on her left plantar heel and left plantar great toe. The area on the heel has some depth although it looks about the same today. Her MRI will finally be done tomorrow. If she has osteomyelitis in the heel and then we will need to consider her for IV antibiotics and hyperbaric oxygen. If the MRI is negative she will need a total contact cast although she is wearing a cam boot and using a scooter at present. Will be using silver alginate. She will take it off tomorrow in preparation for the MRI 2/14; the patient's MRI showed extensive surgical changes with a large portion of her calcaneus removed on the left secondary to her previous surgery by Dr. Sharol Given however there is no evidence of osteomyelitis. She would therefore be a candidate for a total contact cast and we applied this for the first time today 2/21; silver collagen total contact cast. The area on the plantar left great toe is "healed" still a lot of callus on this area. Dimensions on the plantar heel not too much different some epithelialization is present however. This is an improvement 12/25/18 on evaluation today patient actually is seen for follow-up concerning issues that she has been having with her cast she states she feels like it's wet and squishy in the bottom of her cast. The reason she does come in to have this evaluated and see what is going on. Fortunately there's no signs of infection at this time was the cast was removed. 3/6; the patient's area on the tip of her right great toe is callused but there is no open area here. She still has the fairly extensive area in the left heel. We have been using silver collagen in the wound. 01/08/19 on evaluation today patient actually appears to be doing  very well in regard to her heel ulcer in my pinion to see if you shown signs of improvement which is excellent news. There's no evidence of infection. She continues to have quite a bit of drainage but fortunately again this doesn't seem to be hindering her healing. 3/20 -Patient's foot ulcer on the left appears to be doing well, the dimensions appear encouraging, we have been using total contact cast with Prisma and will continue doing that 3/27; patient continues to make nice progress on the left plantar heel. She is using silver collagen under a total contact cast. It is been a while since I have seen this wound and it really looks a lot better. Smaller with healthy granulation 4/3; she continues to make nice progress on the left plantar heel. Using silver collagen under a total contact cast 4/10; left plantar heel. Again skin over the surface of the wound with not much in the way of adherence. This results  in undermining. Using silver collagen changed to silver alginate 4/17 left plantar heel. Again not much improvement. There is no undermining today no debridement was required we used silver alginate last time because of excessive moisture 4/24 left plantar heel. Again not as much improvement as I would have liked. About 3 mm of depth. Thick callused skin around the circumference. Using silver alginate 5/1; left plantar heel this is improved this week. Less depth epithelialization is present. Using silver alginate alginate under a total contact cast 5/8; left plantar heel dimensions are about the same. The depth appears to be improved. I use silver collagen starting today under the cast 5/15 left plantar heel. Arrives today with a 2-day history of feeling like something had "slipped" while walking her dog in the cast. Unfortunately extensive area relatively to the small wound of undermining superiorly denuded epithelium. 5/22-Patient returns at 1 week after being taken off the [facility] on account of  some fluid collection that was debrided and cultured at last visit from the plantar ulcer on the left heel. The culture results are polymicrobial with Klebsiella, enterococcus, Proteus growth these organisms are sensitive to Cipro except with enterococcus which is sensitive to ampicillin but patient is highly allergic to penicillin according to her. 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 5/29; the patient arrived last week with a new wound on her left plantar great toe. With regards to the culture that I did 2 weeks ago of her deteriorating heel wound this grew Klebsiella and enterococcus. She was given Cipro however the enterococcus would not be covered well by a quinolone. She is allergic to penicillin. I will give her linezolid 600 twice daily x5 days 6/12; the patient continues to have a wound on her left and after she returns to the beach there is undermining laterally. She has the new wound from this 2 weeks or so ago on the plantar tip of her left great toe. She had a fall today scraping the dorsal surface of the left fifth 7/14; READMISSION since the patient was last here she was hospitalized from 04/15/2019 through 04/22/2019. She had presented to an outside ER after falling and hitting her elbow. She had had previous surgery on the elbow and fractures several years ago. An x-ray was negative and she was sent home. She is readmitted with sepsis and acute renal failure secondary to septic arthritis of the elbow. Cultures apparently showed pansensitive staph aureus however she has a severe beta-lactam allergy. She was treated with vancomycin. Apparently the vancomycin is completed and her PICC line is removed although she is seeing Dr. Megan Salon tomorrow due to continued pain and swelling. In the hospital she had an IandD by Dr. [name] of orthopedics. The original surgery was on 04/17/2019 and it was felt  that she had septic arthritis. She is still having a lot of pain and swelling in the right elbow and apparently is due to see Dr. Megan Salon tomorrow and what she thinks is that she will be restarted on antibiotics. With regards to her heel wounds/left foot wounds. Her arterial studies were checked and her ABIs were within normal limits. X-ray showed plantar foot ulcer negative for osteomyelitis postop resection of the posterior calcaneus. She has been using silver alginate to the heel 8/4-Patient returns after being seen on 7/14, we are using silver alginate to the calcaneal wound, she is continuing to receive care for her right  elbow septic arthritis 8/14- Patient returns after 1 week, she is being seen by the surgical group for her right elbow, she is here for the left calcaneal wound for which we are using silver alginate this is about the same 8/21; this is a patient I readmitted to the clinic 5 weeks ago. She is continuing to have difficulties with a septic arthritis of the right elbow she had after a fall and apparently has had surgical IandD's since the last time we saw her. She is also following with Dr. Megan Salon of infectious disease and is apparently on 3 oral antibiotics although at the time of this dictation I am not sure what they are. She comes in today with a necrotic surface on the left great toe with a blister laterally. This was still clearly an open wound. She had purulent drainage coming out of this. The original wound on the plantar aspect of her right heel in the setting of a Charcot foot is deep not open to bone but certainly not any better at all. We have been using silver alginate 8/28; dealing with septic arthritis of the right elbow. May need to go for further surgery here in the second week of September. She had a blister on the left great toe that was purulent [place] draining last week although a culture did not grow anything [already on antibiotics through infectious disease  for her elbow]. The punched-out area on her heel is just like it was when she first came in. This almost closed with a total contact cast there are no options for that now. The patient states she cannot stay off her foot having to do housework X-ray of the foot showed a moderate bunion and severe degenerative changes at the first metatarsal phalangeal joint there was a moderate plantar calcaneal spur. We will need to check the location of this. 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 It is oral. We are using silver collagen to the punched-out area on her left heel and to the summer superficial area of the left great toe. She is offloading this is best she can although judging by the amount of callus it is not enough. She thinks she has enough strength in her right elbow now to use her scooter. 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 Hale Bogus notes and infectious disease. We are using silver collagen to the punched-out areas on her left heel and to the plantar aspect of her left great toe she is now using her scooter to get around and to help offload these areas 10/2; 2-week follow-up. Still on clindamycin I believe for the left elbow she is going for an MRI of the elbow over the weekend. She is then going to see orthopedics and infectious disease. The area on the left heel is certainly no better. This does not probe to bone however there is green drainage. She has an area on the tip of her toe. The area on the heel is in a wound we almost closed at one point with total contact casting. 10/9; one-week follow-up. Culture I did of the heel last week which was a swab  culture  admittedly showed moderate Enterococcus faecalis moderate Pseudomonas. The wound itself looks about the same. There is no palpable bone although the depth of it closely approximates bone. The heel is swollen but not erythematous. Her MRI is booked for next week. She also has an MRI of the right elbow. I've also lifted the last consult from Dr. Megan Salon who is following her for septic arthritis of the right elbow. I did not see him specifically comment on her left heel however the patient states that he is aware of this. I did not specifically address the organisms I cultured last week because of the possible effect of any additional cultures that will be done on the elbow. Additionally these were superficial not bone cultures 10/16; her MRI of the heel was put back to next week. She did have her elbow done. I have been in contact with Dr. Megan Salon about my concerns about osteomyelitis of the left heel. We have been using collagen. She also has a wound on the plantar tip of her first toe 10/23; her MRI of the heel was negative for osteomyelitis but suggested a small abscess. She also had an MRI of her elbow that showed findings compatible with a septic joint. She went to see Dr. Megan Salon and she is going to have both oral and IV antibiotics which I am sure will cover any infection in the heel. I did not actually see his note today. We are using silver collagen offloading the heel with a heel offloading boot 11/6; patient continues with silver collagen to both wound areas on her plantar left heel and plantar left great toe. She remains on daptomycin by Dr. Megan Salon of infectious disease. She complains of diarrhea. Dr. Megan Salon is aware of this. 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 she has been put on oral vancomycin I think a lot of high clinical suspicion for  pseudomembranous colitis Because of the right elbow there are no options to completely off her left foot beyond the heel offloading boot we have now. Fortunately the left heel ulcer itself has remained static some improvement in the plantar left great toe 11/20; patient arrives for review of her left heel ulcer. I also note she has a difficult time with septic arthritis of the right elbow. She currently is on IV daptomycin which seems to have helped the drainage in her elbow [MSSA]. Because of high concern for pseudomembranous colitis she was also put on vancomycin orally and Flagyl orally. She was on Levaquin but that was discontinued because of the concern about C. difficile. She states her diarrhea is better. She arrives in clinic today with a large area of denuded skin on the lateral part of the heel. This had totally separated. We have been using silver alginate to the wound areas. She arrived in a scooter telling me she is offloading the foot is much as possible. 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 Dr. Megan Salon stopped the daptomycin and now has her on vancomycin 1000 mg IV daily. This is supposed to go onto 1/18. She is being referred to [place] for what sounds like a elbow replacement type operation for her MSSA septic arthritis. She arrives  in clinic today with a blister on the medial part of the wound on her heel. She still has a lot of callus around the heel although the surface of the wound on the left heel looks somewhat better. READMISSION 03/19/2020 Mrs. Wohlfarth is a 66 year old woman we have followed for almost all of 2020 with a neuropathic wound on her left heel and the tip of her left great toe.. We she had previously had a partial calcanectomy by Dr. Sharol Given because of underlying  osteomyelitis. She also had a history of MRSA even when we admitted her to the clinic. An MRI when she first came into our clinic did not show osteomyelitis. We put her in a total contact cast and gradually this contracted to the point it was almost closed however in that same timeframe she had a fall. 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 We had to stop putting her in a cast partially because of infection and partially because she could not support herself with the elbow. Things went progressively downhill. When we last saw her at the end of December she had a large open wound on the left heel. She had had recurrent infections in the right elbow. It was felt that either the heel lift: Ice her elbow or vice versa. We never had MRSA I do not believe cultured in the left heel however. 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 She underwent an operative debridement and a T11-L3 fusion. An operative culture grew Pseudomonas. She was treated with cefepime and required readmission to the hospital with acute kidney failure requiring temporary dialysis. She was discharged to rehab I believe she signed herself out [place]. He has been following with her primary doctor and she comes in the clinic today with miraculously the left heel totally closed. She is followed up with Dr. Megan Salon of infectious disease he does not feel she has any evidence of infection in her elbow or her heel for that matter and the heel is actually fully epithelialized. She is on ciprofloxacin 500 twice daily I think mostly directed at Pseudomonas. The patient is convinced that it was the treatment of the Pseudomonas in her back that ultimately led to closure of her heel and that certainly possible or could have been because she was nonambulatory in the hospital for 3 months according to her. She is still using her heel offloading boot and  she says she is "learning to walk again" Readmission: 06/09/2020 patient presents today for reevaluation here in the clinic following a reopening of her heel ulcer. She tells [name] that this actually occurred about 2 weeks after she was last seen in May of this year though it is now the beginning of August and she has not come back until now to see [name]. Nonetheless she does have an open wound there is been no x-rays at this time infectious disease has been monitoring him following this up until this point. They made the recommendation that she come to see [name]. Fortunately there is no signs of active infection systemically though she does tell me that she had Pseudomonas in the spine when she had her back surgery she is currently on antibiotics for that. 8/20; this is a patient who I saw 1 time in late May. Apparently shortly after she was here the area on her plantar left heel started to drain again and she had an open wound.  She has been going to her primary physician and has been following with Dr. Megan Salon. 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 She is currently on 3 times a day prophylactic ciprofloxacin. She is admitted to the clinic last week for review of the wound on the left heel. Notable for the fact that she had a partial calcanectomy by Dr. Sharol Given 3 or 4 years ago. She says she is using her motorized wheelchair again as an off loader but she came in in regular shoes. 9/2; 2-week follow-up. Plantar left heel. In spite of debridement last week she had thick very adherent callus around the circumference of the wound and a nonviable gray surface. We have been using Hydrofera Blue. We gave her a heel offloading boot last week. She says she is off her foot except for she needs to walk her dog. 9/17; 2 week follow-up. Lives with a heel ulcer in roughly the same condition as a month ago. Though requiring  debridement and revision using Hydrofera Blue. She says she is often except for times that she needs to walk her dog and she claims to be using the heel offloading shoe. She has a new injury which she says was a burn on the medial part of her fifth metacarpophalangeal on the right. 10/15; almost 1 month follow-up. She arrives with the plantar left heel ulcer and not a very good condition. She has thick callus surrounding the wound and a necrotic base over the top of this. I am not sure anybody is paying close attention to this. She says she walks minimally but I highly doubt this. She is supposed to be using Hydrofera Blue. She says she has plenty of supplies. She tells me that she is traveling to [place] next week for a 10-day trip. We will see her back after that She has both been approved for Dermagraft however I am only willing to attempt this if she is going to agree to a total contact cast. I do not think there is any point in applying this advance dressing when she is walking on this excessively READMISSION 01/10/2021 The patient was last here in October at which time we were dealing with a very difficult wound on the left plantar heel. It was not in very good condition at the time. She tells me she went to [place] on vacation. Shortly after she arrived back her husband was in an accident and then after that she had congestive heart failure secondary to her advanced chronic renal failure. I thought she told me she was admitted to hospital but I certainly do not see this at [facility] health. In any case while she was markedly fluid overloaded she developed an open area on the right lateral heel to go along with the original wound on the left. Both of these completely covered with necrotic surface. I do not know that she is been dressing these at all. She has a heel offloading boot on the left and an ordinary shoe on the right. Before she left last time I did an MRI of the left heel that  did not show distinctly osteomyelitis. She was approved for Dermagraft but the wounds are certainly not in a state for application of an advanced treatment product at this point Besides this her past medical history is largely unchanged. She has type 2 diabetes with peripheral neuropathy with stage IV chronic  renal failure last estimated GFR at 20. 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 Culture at that point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 directed. Secured With: 86M [employer] Public affairs consultant Surgical T 2x10 (in/yd) Every Other Day/30 Days ape Discharge Instructions: Secure with tape as directed. 1. The right foot is in very poor condition I told her this. She sees Dr[facility] Megan [facility]on [facility]. She is supposed to have finished her Rocephin yesterday although she seems to have missed some doses. She said she was having rectal bleeding. She does  not seem to have involved her primary doctor and she does not have a gastroenterologist 2. All of the wounds on the right foot are going to require silver alginate with packing several of these are deep. The area on the lateral plantar foot approaches bone 3. 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 4. She was septic on presentation. I told her that if this happens again she is going to need an amputation on the right for sure in fact I am not certain that she does not require that now 5. The left heel is been a chronic wound. There is the middle part of this that also probes to bone I also debrided this Electronic Signature(s) Signed: 11/29/2021 4:28:46 PM By: Linton Ham MD Entered By: Linton Ham on 11/29/2021 14:24:01 -------------------------------------------------------------------------------- HxROS Details Patient Name: Date of Service: [name] [name], [name] 11/29/2021 1:15 PM Medical Record Number: [id number] Patient Account Number: [id number] Date of Birth/Sex: Treating RN: [birth year] (66 y.o. Female) [name] Primary Care Provider: [name] Other Clinician: Referring Provider: Treating Provider/Extender: Cheree Ditto in Treatment: 0 Information Obtained From Patient Ear/Nose/Mouth/Throat Complaints and Symptoms: Negative for: Chronic sinus problems or rhinitis Medical History: Negative for: Chronic sinus problems/congestion; Middle ear problems Past Medical History Notes: seasonal allergies Integumentary (Skin) Complaints and Symptoms: Positive for: Wounds - wounds on bilateral feet Medical History: Negative for: History of Burn Psychiatric Complaints and Symptoms: Negative for: Claustrophobia; Suicidal Medical History: Negative for: Anorexia/bulimia; Confinement Anxiety Constitutional Symptoms (General  Health) Medical History: Past Medical History Notes: morbid obesity, fibromyalgia Eyes Medical History: Positive for: Cataracts - left eye removed, mild right eye Negative for: Glaucoma; Optic Neuritis Past Medical History Notes: macular degeneration left eye Hematologic/Lymphatic Medical History: Positive for: Anemia Negative for: Hemophilia; Human Immunodeficiency Virus; Lymphedema; Sickle Cell Disease Respiratory Medical History: Positive for: Sleep Apnea Negative for: Aspiration; Asthma; Chronic Obstructive Pulmonary Disease (COPD); Pneumothorax; Tuberculosis Cardiovascular Medical History: Positive for: Coronary Artery Disease; Hypertension; Myocardial Infarction - 2014 Past  Medical History Notes: hyperlipidemia Gastrointestinal Medical History: Negative for: Cirrhosis ; Colitis; Crohns; Hepatitis A; Hepatitis B; Hepatitis C Past Medical History Notes: fatty liver Endocrine Medical History: Positive for: Type II Diabetes Negative for: Type I Diabetes Time with diabetes: 10 yrs Treated with: Insulin Blood sugar tested every day: Yes Tested : 2-3 times a week Blood sugar testing results: Breakfast: 100; Lunch: 120 Genitourinary Medical History: Past Medical History Notes: CKD stage 3 Immunological Medical History: Negative for: Lupus Erythematosus; Raynauds; Scleroderma Musculoskeletal Medical History: Positive for: Osteoarthritis; Osteomyelitis - left and right foot Negative for: Gout; Rheumatoid Arthritis Neurologic Medical History: Positive for: Neuropathy Negative for: Dementia; Quadriplegia; Paraplegia; Seizure Disorder Past Medical History Notes: cervical ruptured disc Oncologic Medical History: Negative for: Received Chemotherapy; Received Radiation HBO Extended History Items Eyes: Cataracts Immunizations Pneumococcal Vaccine: Received Pneumococcal Vaccination: No Implantable Devices None Hospitalization / Surgery History Type of  Hospitalization/Surgery left heel surgery hysterectomy cholecystectomy right elbow surgery x4 December 2021 Family and Social History Cancer: No; Diabetes: Yes - Maternal Grandparents,Paternal Grandparents,Father,Siblings; Heart Disease: Yes - Mother,Father,Siblings; Hereditary Spherocytosis: No; Hypertension: Yes - Father; Kidney Disease: No; Lung Disease: No; Seizures: No; Stroke: No; Thyroid Problems: No; Tuberculosis: No; Never smoker; Marital Status - Married; Alcohol Use: Never; Drug Use: No History; Caffeine Use: Moderate - tea; Financial Concerns: No; Food, Clothing or Shelter Needs: No; Support System Lacking: No; Transportation Concerns: No Engineer, maintenance) Signed: 11/29/2021 4:28:46 PM By: Linton Ham MD Signed: 11/29/2021 5:37:15 PM By: [name] RN, BSN Entered By: [name] on 11/29/2021 13:24:21 -------------------------------------------------------------------------------- SuperBill Details Patient Name: Date of Service: [name] [name], [name] 11/29/2021 Medical Record Number: [id number] Patient Account Number: [id number] Date of Birth/Sex: Treating RN: 24-Jul-1956 (66 y.o. 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, right ankle and foot M86.672 Other chronic osteomyelitis, left ankle and foot Facility Procedures CPT4 Code: 33295188 Description: 99213 - WOUND CARE VISIT-LEV 3 EST PT Modifier: 25 Quantity: 1 CPT4 Code: 41660630 Description: 11042 - DEB SUBQ TISSUE 20 SQ CM/< ICD-10 Diagnosis Description L97.514 Non-pressure  chronic ulcer of other part of right foot with necrosis of bone L97.524 Non-pressure chronic ulcer of other part of left foot with necrosis of bone Modifier: Quantity: 1 CPT4 Code: [id number] Description: 11045 - DEB SUBQ TISS EA ADDL 20CM ICD-10 Diagnosis Description L97.514 Non-pressure chronic ulcer of other part of right foot with necrosis of bone L97.524 Non-pressure chronic ulcer of other part of left foot with necrosis of bone Modifier: Quantity: 1 Physician Procedures : CPT4 Code Description Modifier [id number] 02542 - WC PHYS LEVEL 4 - EST PT ICD-10 Diagnosis Description L97.514 Non-pressure chronic ulcer of other part of right foot with necrosis of bone L97.524 Non-pressure chronic ulcer of other part of left foot with  necrosis of bone M86.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, right ankle and foot M86.672 Other chronic osteomyelitis, left ankle and foot Quantity: 1 : [id number] 11042 - WC PHYS SUBQ TISS 20 SQ CM ICD-10 Diagnosis Description L97.514 Non-pressure chronic ulcer of other part of right foot with necrosis of bone L97.524 Non-pressure chronic ulcer of other part of left foot with necrosis of bone [id number]  11045 - WC PHYS  SUBQ TISS EA ADDL 20 CM 1 ICD-10 Diagnosis Description L97.514 Non-pressure chronic ulcer of other part of right foot with necrosis of bone L97.524 Non-pressure chronic ulcer of other part of left foot with necrosis of bone Quantity: 1 Electronic Signature(s) Signed: 11/29/2021 5:37:15 PM By: [name] RN, BSN Signed: 11/30/2021 4:31:34 PM By: Linton Ham MD Previous Signature: 11/29/2021 4:28:46 PM Version By: Linton Ham MD Entered By: [name] on 11/29/2021 17:32:30

## 2021-11-30 NOTE — Telephone Encounter (Signed)
[place] NP notified of creat-3.58 and HGB-6.6.  Orders received for pt to get two units of PRBC's tomorrow.  Message sent to scheduling.

## 2021-11-30 NOTE — Progress Notes (Signed)
FARHA, DANO ([id number]) Visit Report for 11/29/2021 Allergy List Details Patient Name: Date of Service: [name] [name], [name] 11/29/2021 1:15 PM Medical Record Number: [id number] Patient Account Number: [id number] Date of Birth/Sex: Treating RN: 08-03-1956 (66 y.o. Female) [name] Primary Care Jayshaun Phillips: [name] Other Clinician: Referring Kristyne Woodring: Treating Dvid Pendry/Extender: Cheree Ditto in Treatment: 0 Allergies Active Allergies Fish Containing Products Reaction: hives, swelling mushroom Reaction: anaphylaxis Severity: Severe penicillin Reaction: throat swelling Severity: Severe tomato, raw Reaction: hives rosemary Reaction: anaphylaxis Severity: Severe Shellfish Containing Products Reaction: hives, swelling Severity: Severe aloe vera Reaction: rash bee venom protein (honey bee) Reaction: hives, swelling Severity: Moderate acetaminophen Reaction: GI upset acyclovir Reaction: unknown naproxen Reaction: unknown doxycycline Allergy Notes Electronic Signature(s) Signed: 11/29/2021 5:37:15 PM By: [name] RN, BSN Entered By: [name] on 11/29/2021 13:22:59 -------------------------------------------------------------------------------- Arrival Information Details Patient Name: Date of Service: [name], [name] 11/29/2021 1:15 PM Medical Record Number: [id number] Patient Account Number: [id number] Date of Birth/Sex: Treating RN: 07-07-1956 (66 y.o. Female) [name] Primary Care Louella Medaglia: [name] Other Clinician: Referring Aravind Chrismer: Treating Caileigh Canche/Extender: Cheree Ditto in Treatment: 0 Visit Information Patient Arrived: Wheel Chair Arrival Time: 13:03 Accompanied By: husband Transfer Assistance: Manual Patient Identification Verified: Yes Secondary Verification Process Completed: Yes Patient Requires Transmission-Based Precautions: No Patient Has Alerts: No History Since Last Visit Added  or deleted any medications: No Any new allergies or adverse reactions: No Had a fall or experienced change in activities of daily living that may affect risk of falls: No Signs or symptoms of abuse/neglect since last visito No Hospitalized since last visit: No Implantable device outside of the clinic excluding cellular tissue based products placed in the center since last visit: No Pain Present Now: Yes Electronic Signature(s) Signed: 11/29/2021 5:37:15 PM By: [name] RN, BSN Entered By: [name] on 11/29/2021 13:21:39 -------------------------------------------------------------------------------- Clinic Level of Care Assessment Details Patient Name: Date of Service: [name], [name] 11/29/2021 1:15 PM Medical Record Number: [id number] Patient Account Number: [id number] Date of Birth/Sex: Treating RN: [birth year] (66 y.o. Female) [name] Primary Care Alvis Pulcini: [name] Other Clinician: Referring Braylee Bosher: Treating Lexine Jaspers/Extender: Cheree Ditto in Treatment: 0 Clinic Level of Care Assessment Items TOOL 1 Quantity Score X- 1 0 Use when EandM and Procedure is performed on INITIAL visit ASSESSMENTS - Nursing Assessment / Reassessment X- 1 20 General Physical Exam (combine w/ comprehensive assessment (listed just below) when performed on new pt. evals) X- 1 25 Comprehensive Assessment (HX, ROS, Risk Assessments, Wounds Hx, etc.) ASSESSMENTS - Wound and Skin Assessment / Reassessment []  - 0 Dermatologic / Skin Assessment (not related to wound area) ASSESSMENTS - Ostomy and/or Continence Assessment and Care []  - 0 Incontinence Assessment and Management []  - 0 Ostomy Care Assessment and Management (repouching, etc.) PROCESS - Coordination of Care X - Simple Patient / Family Education for ongoing care 1 15 []  - 0 Complex (extensive) Patient / Family Education for ongoing care X- 1 10 Staff obtains Programmer, systems, Records, T Results / Process  Orders est []  - 0 Staff telephones HHA, Nursing Homes / Clarify orders / etc []  - 0 Routine Transfer to another Facility (non-emergent condition) []  - 0 Routine Hospital Admission (non-emergent condition) X- 1 15 New Admissions / Biomedical engineer / Ordering NPWT Apligraf, etc. , []  - 0 Emergency Hospital Admission (emergent condition) PROCESS - Special Needs []  - 0 Pediatric / Minor Patient Management []  - 0 Isolation Patient Management []  - 0  Hearing / Language / Visual special needs []  - 0 Assessment of Community assistance (transportation, D/C planning, etc.) []  - 0 Additional assistance / Altered mentation []  - 0 Support Surface(s) Assessment (bed, cushion, seat, etc.) INTERVENTIONS - Miscellaneous []  - 0 External ear exam []  - 0 Patient Transfer (multiple staff / Civil Service fast streamer / Similar devices) []  - 0 Simple Staple / Suture removal (25 or less) []  - 0 Complex Staple / Suture removal (26 or more) []  - 0 Hypo/Hyperglycemic Management (do not check if billed separately) X- 1 15 Ankle / Brachial Index (ABI) - do not check if billed separately Has the patient been seen at the hospital within the last three years: Yes Total Score: 100 Level Of Care: New/Established - Level 3 Electronic Signature(s) Signed: 11/29/2021 5:37:15 PM By: [name] RN, BSN Entered By: [name] on 11/29/2021 17:32:18 -------------------------------------------------------------------------------- Encounter Discharge Information Details Patient Name: Date of Service: [name], [name] 11/29/2021 1:15 PM Medical Record Number: [id number] Patient Account Number: [id number] Date of Birth/Sex: Treating RN: 1956/09/04 (66 y.o. Female) [name] Primary Care Therma Lasure: [name] Other Clinician: Referring Bodey Frizell: Treating Draken Farrior/Extender: Cheree Ditto in Treatment: 0 Encounter Discharge Information Items Post Procedure Vitals Discharge Condition:  Stable Temperature (F): 97.9 Ambulatory Status: Wheelchair Pulse (bpm): 62 Discharge Destination: Home Respiratory Rate (breaths/min): 18 Transportation: Private Auto Blood Pressure (mmHg): 122/58 Accompanied By: husband Schedule Follow-up Appointment: Yes Clinical Summary of Care: Patient Declined Electronic Signature(s) Signed: 11/29/2021 5:37:15 PM By: [name] RN, BSN Entered By: [name] on 11/29/2021 17:34:25 -------------------------------------------------------------------------------- Lower Extremity Assessment Details Patient Name: Date of Service: [name], [name] 11/29/2021 1:15 PM Medical Record Number: [id number] Patient Account Number: [id number] Date of Birth/Sex: Treating RN: [birth year] (66 y.o. Female) [name] Primary Care Ayaka Andes: [name] Other Clinician: Referring Toluwani Ruder: Treating Cristobal Advani/Extender: Cheree Ditto in Treatment: 0 Edema Assessment Assessed: Shirlyn Goltz: No] [Right: No] Edema: [Left: Yes] [Right: Yes] Calf Left: Right: Point of Measurement: 27 cm From Medial Instep 41 cm 42.5 cm Ankle Left: Right: Point of Measurement: 9 cm From Medial Instep 26.5 cm 28.6 cm Vascular Assessment Pulses: Dorsalis Pedis Palpable: [Left:Yes] [Right:Yes] Blood Pressure: Brachial: [Left:122] [Right:122] Ankle: [Left:Dorsalis Pedis: 150 1.23] [Right:Dorsalis Pedis: 148 1.21] Electronic Signature(s) Signed: 11/29/2021 5:37:15 PM By: [name] RN, BSN Entered By: [name] on 11/29/2021 13:44:18 -------------------------------------------------------------------------------- Multi Wound Chart Details Patient Name: Date of Service: [name], [name] 11/29/2021 1:15 PM Medical Record Number: [id number] Patient Account Number: [id number] Date of Birth/Sex: Treating RN: 1956/07/11 (66 y.o. Female) [name] Primary Care Burley Kopka: [name] Other Clinician: Referring Jayton Popelka: Treating  Karisa Nesser/Extender: Cheree Ditto in Treatment: 0 Vital Signs Height(in): 61 Pulse(bpm): 5 Weight(lbs): 169 Blood Pressure(mmHg): 122/58 Body Mass Index(BMI): 31.9 Temperature(F): 97.9 Respiratory Rate(breaths/min): 18 Photos: [11:Left Calcaneus] [12:Right, Plantar Foot] [13:Right, Dorsal Foot] Wound Location: [11:Gradually Appeared] [12:Gradually Appeared] [13:Gradually Appeared] Wounding Event: [11:Diabetic Wound/Ulcer of the Lower] [12:Diabetic Wound/Ulcer of the Lower] [13:Diabetic Wound/Ulcer of the Lower] Primary Etiology: [11:Extremity Cataracts, Anemia, Sleep Apnea,] [12:Extremity Cataracts, Anemia, Sleep Apnea,] [13:Extremity Cataracts, Anemia, Sleep Apnea,] Comorbid History: [11:Coronary Artery Disease, Hypertension, Myocardial Infarction, Hypertension, Myocardial Infarction, Hypertension, Myocardial Infarction, Type II Diabetes, Osteoarthritis, Osteomyelitis, Neuropathy 10/30/2020] [12:Coronary Artery  Disease, Type II Diabetes, Osteoarthritis, Osteomyelitis, Neuropathy 09/29/2021] [13:Coronary Artery Disease, Type II Diabetes, Osteoarthritis, Osteomyelitis, Neuropathy 09/29/2021] Date Acquired: [11:0] [12:0] [13:0] Weeks of Treatment: [11:Open] [12:Open] [13:Open] Wound Status: [11:No] [12:No] [13:No] Wound Recurrence: [11:4.4x3.2x1] [12:1.5x1.5x1.2] [13:1.1x0.9x0.1] Measurements L x W x  D (cm) [11:11.058] [12:1.767] [13:0.778] A (cm) : rea [11:11.058] [12:2.121] [13:0.078] Volume (cm) : [11:0.00%] [12:0.00%] [13:0.00%] % Reduction in A [11:rea: 0.00%] [12:0.00%] [13:0.00%] % Reduction in Volume: [11:Grade 3] [12:Grade 3] [13:Grade 2] Classification: [11:Medium] [12:Medium] [13:Medium] Exudate A mount: [11:Serous] [12:Serosanguineous] [13:Serosanguineous] Exudate Type: [11:amber] [12:red, brown] [13:red, brown] Exudate Color: [11:Flat and Intact] [12:Thickened] [13:Flat and Intact] Wound Margin: [11:Medium (34-66%)] [12:Small (1-33%)] [13:Medium  (34-66%)] Granulation A mount: [11:Pink, Pale] [12:Pink, Pale] [13:Pink, Pale, Hyper-granulation] Granulation Quality: [11:Medium (34-66%)] [12:Large (67-100%)] [13:Medium (34-66%)] Necrotic A mount: [11:Fat Layer (Subcutaneous Tissue): Yes Fat Layer (Subcutaneous Tissue): Yes Fat Layer (Subcutaneous Tissue): Yes] Exposed Structures: [11:Fascia: No Tendon: No Muscle: No Joint: No Bone: No None] [12:Fascia: No Tendon: No Muscle: No Joint: No Bone: No None] [13:Fascia: No Tendon: No Muscle: No Joint: No Bone: No None] Epithelialization: [11:Debridement - Excisional] [12:Debridement - Excisional] [13:N/A] Debridement: Pre-procedure Verification/Time Out 13:55 [12:13:55] [13:N/A] Taken: [11:Subcutaneous, Slough] [12:Subcutaneous, Slough] [13:N/A] Tissue Debrided: [11:Skin/Subcutaneous Tissue] [12:Skin/Subcutaneous Tissue] [13:N/A] Level: [11:14.08] [12:2.25] [13:N/A] Debridement A (sq cm): [11:rea Curette] [12:Curette] [13:N/A] Instrument: [11:Minimum] [12:Minimum] [13:N/A] Bleeding: [11:Pressure] [12:Pressure] [13:N/A] Hemostasis A chieved: [11:0] [12:0] [13:N/A] Procedural Pain: [11:0] [12:0] [13:N/A] Post Procedural Pain: [11:Procedure was tolerated well] [12:Procedure was tolerated well] [13:N/A] Debridement Treatment Response: [11:4.4x3.2x1] [12:1.5x1.5x1.2] [13:N/A] Post Debridement Measurements L x W x D (cm) [11:11.058] [12:2.121] [13:N/A] Post Debridement Volume: (cm) [11:Debridement] [12:Debridement] [13:Biopsy] Wound Number: [id number] 15 N/A Photos: N/A Right, Proximal, Lateral Foot Right, Distal, Lateral Foot N/A Wound Location: Gradually Appeared Gradually Appeared N/A Wounding Event: Diabetic Wound/Ulcer of the Lower Diabetic Wound/Ulcer of the Lower N/A Primary Etiology: Extremity Extremity Cataracts, Anemia, Sleep Apnea, Cataracts, Anemia, Sleep Apnea, N/A Comorbid History: Coronary Artery Disease, Coronary Artery Disease, Hypertension, Myocardial Infarction, Hypertension,  Myocardial Infarction, Type II Diabetes, Osteoarthritis, Type II Diabetes, Osteoarthritis, Osteomyelitis, Neuropathy Osteomyelitis, Neuropathy 09/29/2021 09/29/2021 N/A Date Acquired: 0 0 N/A Weeks of Treatment: Open Open N/A Wound Status: No No N/A Wound Recurrence: 0.7x1.6x0.2 1.6x2.2x1 N/A Measurements L x W x D (cm) 0.88 2.765 N/A A (cm) : rea 0.176 2.765 N/A Volume (cm) : 0.00% 0.00% N/A % Reduction in A rea: 0.00% 0.00% N/A % Reduction in Volume: Grade 2 Grade 2 N/A Classification: Medium Medium N/A Exudate A mount: Serosanguineous Purulent N/A Exudate Type: red, brown yellow, brown, green N/A Exudate Color: Flat and Intact Flat and Intact N/A Wound Margin: Medium (34-66%) Small (1-33%) N/A Granulation Amount: Pink Pink, Pale N/A Granulation Quality: Medium (34-66%) Large (67-100%) N/A Necrotic Amount: Fat Layer (Subcutaneous Tissue): Yes Fat Layer (Subcutaneous Tissue): Yes N/A Exposed Structures: Fascia: No Fascia: No Tendon: No Tendon: No Muscle: No Muscle: No Joint: No Joint: No Bone: No Bone: No None None N/A Epithelialization: Debridement - Excisional Debridement - Excisional N/A Debridement: Pre-procedure Verification/Time Out 13:55 13:55 N/A Taken: Subcutaneous, Slough Subcutaneous, Slough N/A Tissue Debrided: Skin/Subcutaneous Tissue Skin/Subcutaneous Tissue N/A Level: 1.12 3.52 N/A Debridement A (sq cm): rea Curette Curette N/A Instrument: Minimum Minimum N/A Bleeding: Pressure Pressure N/A Hemostasis A chieved: 0 0 N/A Procedural Pain: 0 0 N/A Post Procedural Pain: Procedure was tolerated well Procedure was tolerated well N/A Debridement Treatment Response: 0.7x1.6x0.2 1.6x2.2x1 N/A Post Debridement Measurements L x W x D (cm) 0.176 2.765 N/A Post Debridement Volume: (cm) Debridement Debridement N/A Procedures Performed: Treatment Notes Electronic Signature(s) Signed: 11/29/2021 5:37:15 PM By: [name] RN,  BSN Entered By: [name] on 11/29/2021 17:19:41 -------------------------------------------------------------------------------- Multi-Disciplinary Care Plan Details Patient Name: Date of Service: [name], [name] 11/29/2021 1:15  PM Medical Record Number: [id number] Patient Account Number: [id number] Date of Birth/Sex: Treating RN: [birth year] (66 y.o. Female) [name] Primary Care Eyvonne Burchfield: [name] Other Clinician: Referring Tiersa Dayley: Treating Dailon Sheeran/Extender: Cheree Ditto in Treatment: 0 Multidisciplinary Care Plan reviewed with physician Active Inactive Abuse / Safety / Falls / Self Care Management Nursing Diagnoses: History of Falls Goals: Patient will remain injury free related to falls Date Initiated: 11/29/2021 Target Resolution Date: 12/30/2021 Goal Status: Active Patient/caregiver will verbalize/demonstrate measures taken to prevent injury and/or falls Date Initiated: 11/29/2021 Target Resolution Date: 12/30/2021 Goal Status: Active Interventions: Assess Activities of Daily Living upon admission and as needed Assess fall risk on admission and as needed Assess: immobility, friction, shearing, incontinence upon admission and as needed Assess impairment of mobility on admission and as needed per policy Assess personal safety and home safety (as indicated) on admission and as needed Assess self care needs on admission and as needed Provide education on fall prevention Provide education on personal and home safety Notes: Nutrition Nursing Diagnoses: Impaired glucose control: actual or potential Potential for alteratiion in Nutrition/Potential for imbalanced nutrition Goals: Patient/caregiver agrees to and verbalizes understanding of need to use nutritional supplements and/or vitamins as prescribed Date Initiated: 11/29/2021 Target Resolution Date: 12/30/2021 Goal Status: Active Patient/caregiver will maintain therapeutic glucose  control Date Initiated: 11/29/2021 Target Resolution Date: 12/30/2021 Goal Status: Active Interventions: Assess HgA1c results as ordered upon admission and as needed Assess patient nutrition upon admission and as needed per policy Provide education on elevated blood sugars and impact on wound healing Provide education on nutrition Notes: Wound/Skin Impairment Nursing Diagnoses: Impaired tissue integrity Knowledge deficit related to ulceration/compromised skin integrity Goals: Patient/caregiver will verbalize understanding of skin care regimen Date Initiated: 11/29/2021 Target Resolution Date: 12/30/2021 Goal Status: Active Interventions: Assess patient/caregiver ability to obtain necessary supplies Assess patient/caregiver ability to perform ulcer/skin care regimen upon admission and as needed Assess ulceration(s) every visit Provide education on ulcer and skin care Notes: Electronic Signature(s) Signed: 11/29/2021 5:37:15 PM By: [name] RN, BSN Entered By: [name] on 11/29/2021 17:30:27 -------------------------------------------------------------------------------- Pain Assessment Details Patient Name: Date of Service: [name], [name] 11/29/2021 1:15 PM Medical Record Number: [id number] Patient Account Number: [id number] Date of Birth/Sex: Treating RN: 02-Jun-1956 (66 y.o. Female) [name] Primary Care Darthy Manganelli: [name] Other Clinician: Referring Leena Tiede: Treating Olander Friedl/Extender: Cheree Ditto in Treatment: 0 Active Problems Location of Pain Severity and Description of Pain Patient Has Paino No Site Locations Pain Management and Medication Current Pain Management: Electronic Signature(s) Signed: 11/29/2021 5:37:15 PM By: [name] RN, BSN Entered By: [name] on 11/29/2021 13:45:02 -------------------------------------------------------------------------------- Patient/Caregiver Education Details Patient Name: Date  of Service: [name], [name] 1/31/2023andnbsp1:15 PM Medical Record Number: [id number] Patient Account Number: [id number] Date of Birth/Gender: Treating RN: Jan 28, 1956 (66 y.o. Female) [name] Primary Care Physician: [name] Other Clinician: Referring Physician: Treating Physician/Extender: Cheree Ditto in Treatment: 0 Education Assessment Education Provided To: Patient Education Topics Provided Elevated Blood Sugar/ Impact on Healing: Methods: Explain/Verbal Responses: State content correctly Nutrition: Methods: Explain/Verbal Responses: State content correctly Safety: Methods: Explain/Verbal Responses: State content correctly Wound/Skin Impairment: Methods: Explain/Verbal Responses: State content correctly Electronic Signature(s) Signed: 11/29/2021 5:37:15 PM By: [name] RN, BSN Entered By: [name] on 11/29/2021 17:30:45 -------------------------------------------------------------------------------- Wound Assessment Details Patient Name: Date of Service: [name], [name] 11/29/2021 1:15 PM Medical Record Number: [id number] Patient Account Number: [id number] Date of Birth/Sex: Treating RN: 1956-06-13 (66 y.o. Female)  [name] Primary Care Dung Salinger: [name] Other Clinician: Referring Gena Laski: Treating Chani Ghanem/Extender: Cheree Ditto in Treatment: 0 Wound Status Wound Number: 11 Primary Diabetic Wound/Ulcer of the Lower Extremity Etiology: Wound Location: Left Calcaneus Wound Open Wounding Event: Gradually Appeared Status: Date Acquired: 10/30/2020 Comorbid Cataracts, Anemia, Sleep Apnea, Coronary Artery Disease, Weeks Of Treatment: 0 History: Hypertension, Myocardial Infarction, Type II Diabetes, Clustered Wound: No Osteoarthritis, Osteomyelitis, Neuropathy Photos Wound Measurements Length: (cm) 4.4 Width: (cm) 3.2 Depth: (cm) 1 Area: (cm) 11.058 Volume: (cm) 11.058 % Reduction in  Area: 0% % Reduction in Volume: 0% Epithelialization: None Tunneling: No Undermining: No Wound Description Classification: Grade 3 Wound Margin: Flat and Intact Exudate Amount: Medium Exudate Type: Serous Exudate Color: amber Foul Odor After Cleansing: No Slough/Fibrino Yes Wound Bed Granulation Amount: Medium (34-66%) Exposed Structure Granulation Quality: Pink, Pale Fascia Exposed: No Necrotic Amount: Medium (34-66%) Fat Layer (Subcutaneous Tissue) Exposed: Yes Necrotic Quality: Adherent Slough Tendon Exposed: No Muscle Exposed: No Joint Exposed: No Bone Exposed: No Treatment Notes Wound #11 (Calcaneus) Wound Laterality: Left Cleanser Normal Saline Discharge Instruction: Cleanse the wound with Normal Saline prior to applying a clean dressing using gauze sponges, not tissue or cotton balls. Wound Cleanser Discharge Instruction: Cleanse the wound with wound cleanser prior to applying a clean dressing using gauze sponges, not tissue or cotton balls. Peri-Wound Care Topical Primary Dressing KerraCel Ag Gelling Fiber Dressing, 4x5 in (silver alginate) Discharge Instruction: Apply silver alginate to wound bed as instructed Secondary Dressing Woven Gauze Sponge, Non-Sterile 4x4 in Discharge Instruction: Apply over primary dressing as directed. ABD Pad, 5x9 Discharge Instruction: Apply over primary dressing as directed. Secured With [employer], 4.5x3.1 (in/yd) Discharge Instruction: Secure with Kerlix as directed. 54M Medipore Soft Cloth Surgical T 2x10 (in/yd) ape Discharge Instruction: Secure with tape as directed. Compression Wrap Compression Stockings Add-Ons Electronic Signature(s) Signed: 11/29/2021 5:37:15 PM By: [name] RN, BSN Signed: 11/30/2021 9:58:51 AM By: [name] Entered By: [name] on 11/29/2021 13:36:09 -------------------------------------------------------------------------------- Wound Assessment Details Patient  Name: Date of Service: [name], [name] 11/29/2021 1:15 PM Medical Record Number: [id number] Patient Account Number: [id number] Date of Birth/Sex: Treating RN: Nov 08, 1955 (66 y.o. Female) [name] Primary Care Letisia Schwalb: [name] Other Clinician: Referring Precilla Purnell: Treating Ivy Puryear/Extender: Cheree Ditto in Treatment: 0 Wound Status Wound Number: 12 Primary Diabetic Wound/Ulcer of the Lower Extremity Etiology: Wound Location: Right, Plantar Foot Wound Open Wounding Event: Gradually Appeared Status: Date Acquired: 09/29/2021 Comorbid Cataracts, Anemia, Sleep Apnea, Coronary Artery Disease, Weeks Of Treatment: 0 History: Hypertension, Myocardial Infarction, Type II Diabetes, Clustered Wound: No Osteoarthritis, Osteomyelitis, Neuropathy Photos Wound Measurements Length: (cm) 1.5 Width: (cm) 1.5 Depth: (cm) 1.2 Area: (cm) 1.767 Volume: (cm) 2.121 Wound Description Classification: Grade 3 Wound Margin: Thickened Exudate Amount: Medium Exudate Type: Serosanguineous Exudate Color: red, brown Foul Odor After Cleansing: Slough/Fibrino % Reduction in Area: 0% % Reduction in Volume: 0% Epithelialization: None Tunneling: No Undermining: No No Yes Wound Bed Granulation Amount: Small (1-33%) Exposed Structure Granulation Quality: Pink, Pale Fascia Exposed: No Necrotic Amount: Large (67-100%) Fat Layer (Subcutaneous Tissue) Exposed: Yes Necrotic Quality: Adherent Slough Tendon Exposed: No Muscle Exposed: No Joint Exposed: No Bone Exposed: No Treatment Notes Wound #12 (Foot) Wound Laterality: Plantar, Right Cleanser Normal Saline Discharge Instruction: Cleanse the wound with Normal Saline prior to applying a clean dressing using gauze sponges, not tissue or cotton balls. Wound Cleanser Discharge Instruction: Cleanse the wound with wound cleanser prior to applying a clean dressing using  gauze sponges, not tissue or cotton balls. Peri-Wound  Care Topical Primary Dressing KerraCel Ag Gelling Fiber Dressing, 4x5 in (silver alginate) Discharge Instruction: Apply silver alginate to wound bed as instructed Secondary Dressing Woven Gauze Sponge, Non-Sterile 4x4 in Discharge Instruction: Apply over primary dressing as directed. ABD Pad, 5x9 Discharge Instruction: Apply over primary dressing as directed. Secured With [employer], 4.5x3.1 (in/yd) Discharge Instruction: Secure with Kerlix as directed. 58M Medipore Soft Cloth Surgical T 2x10 (in/yd) ape Discharge Instruction: Secure with tape as directed. Compression Wrap Compression Stockings Add-Ons Electronic Signature(s) Signed: 11/29/2021 5:37:15 PM By: [name] RN, BSN Signed: 11/30/2021 9:58:51 AM By: [name] Entered By: [name] on 11/29/2021 13:36:42 -------------------------------------------------------------------------------- Wound Assessment Details Patient Name: Date of Service: [name], [name] 11/29/2021 1:15 PM Medical Record Number: [id number] Patient Account Number: [id number] Date of Birth/Sex: Treating RN: 05-Dec-1955 (66 y.o. Female) [name] Primary Care Naydeen Speirs: [name] Other Clinician: Referring Zoelle Markus: Treating Hailie Searight/Extender: Cheree Ditto in Treatment: 0 Wound Status Wound Number: 13 Primary Diabetic Wound/Ulcer of the Lower Extremity Etiology: Wound Location: Right, Dorsal Foot Wound Open Wounding Event: Gradually Appeared Status: Date Acquired: 09/29/2021 Comorbid Cataracts, Anemia, Sleep Apnea, Coronary Artery Disease, Weeks Of Treatment: 0 History: Hypertension, Myocardial Infarction, Type II Diabetes, Clustered Wound: No Osteoarthritis, Osteomyelitis, Neuropathy Photos Wound Measurements Length: (cm) 1.1 Width: (cm) 0.9 Depth: (cm) 0.1 Area: (cm) 0.778 Volume: (cm) 0.078 % Reduction in Area: 0% % Reduction in Volume: 0% Epithelialization: None Tunneling:  No Undermining: No Wound Description Classification: Grade 2 Wound Margin: Flat and Intact Exudate Amount: Medium Exudate Type: Serosanguineous Exudate Color: red, brown Foul Odor After Cleansing: No Slough/Fibrino Yes Wound Bed Granulation Amount: Medium (34-66%) Exposed Structure Granulation Quality: Pink, Pale, Hyper-granulation Fascia Exposed: No Necrotic Amount: Medium (34-66%) Fat Layer (Subcutaneous Tissue) Exposed: Yes Necrotic Quality: Adherent Slough Tendon Exposed: No Muscle Exposed: No Joint Exposed: No Bone Exposed: No Electronic Signature(s) Signed: 11/29/2021 5:37:15 PM By: [name] RN, BSN Signed: 11/30/2021 9:58:51 AM By: [name] Entered By: [name] on 11/29/2021 13:37:46 -------------------------------------------------------------------------------- Wound Assessment Details Patient Name: Date of Service: [name], [name] 11/29/2021 1:15 PM Medical Record Number: [id number] Patient Account Number: [id number] Date of Birth/Sex: Treating RN: [birth year] (66 y.o. Female) [name] Primary Care Nguyen Butler: [name] Other Clinician: Referring Shaquaya Wuellner: Treating Adine Heimann/Extender: Cheree Ditto in Treatment: 0 Wound Status Wound Number: 14 Primary Diabetic Wound/Ulcer of the Lower Extremity Etiology: Wound Location: Right, Proximal, Lateral Foot Wound Open Wounding Event: Gradually Appeared Status: Date Acquired: 09/29/2021 Comorbid Cataracts, Anemia, Sleep Apnea, Coronary Artery Disease, Weeks Of Treatment: 0 Weeks Of Treatment: 0 History: Hypertension, Myocardial Infarction, Type II Diabetes, Clustered Wound: No Osteoarthritis, Osteomyelitis, Neuropathy Photos Wound Measurements Length: (cm) 0.7 Width: (cm) 1.6 Depth: (cm) 0.2 Area: (cm) 0.88 Volume: (cm) 0.176 % Reduction in Area: 0% % Reduction in Volume: 0% Epithelialization: None Tunneling: No Undermining: No Wound Description Classification:  Grade 2 Wound Margin: Flat and Intact Exudate Amount: Medium Exudate Type: Serosanguineous Exudate Color: red, brown Foul Odor After Cleansing: No Slough/Fibrino Yes Wound Bed Granulation Amount: Medium (34-66%) Exposed Structure Granulation Quality: Pink Fascia Exposed: No Necrotic Amount: Medium (34-66%) Fat Layer (Subcutaneous Tissue) Exposed: Yes Necrotic Quality: Adherent Slough Tendon Exposed: No Muscle Exposed: No Joint Exposed: No Bone Exposed: No Treatment Notes Wound #14 (Foot) Wound Laterality: Right, Lateral, Proximal Cleanser Normal Saline Discharge Instruction: Cleanse the wound with Normal Saline prior to applying a clean dressing using gauze sponges, not  tissue or cotton balls. Wound Cleanser Discharge Instruction: Cleanse the wound with wound cleanser prior to applying a clean dressing using gauze sponges, not tissue or cotton balls. Peri-Wound Care Topical Primary Dressing KerraCel Ag Gelling Fiber Dressing, 4x5 in (silver alginate) Discharge Instruction: Apply silver alginate to wound bed as instructed Secondary Dressing Woven Gauze Sponge, Non-Sterile 4x4 in Discharge Instruction: Apply over primary dressing as directed. ABD Pad, 5x9 Discharge Instruction: Apply over primary dressing as directed. Secured With [employer], 4.5x3.1 (in/yd) Discharge Instruction: Secure with Kerlix as directed. 52M Medipore Soft Cloth Surgical T 2x10 (in/yd) ape Discharge Instruction: Secure with tape as directed. Compression Wrap Compression Stockings Add-Ons Electronic Signature(s) Signed: 11/29/2021 5:37:15 PM By: [name] RN, BSN Signed: 11/30/2021 9:58:51 AM By: [name] Entered By: [name] on 11/29/2021 13:38:36 -------------------------------------------------------------------------------- Wound Assessment Details Patient Name: Date of Service: [name], [name] 11/29/2021 1:15 PM Medical Record Number: [id number] Patient  Account Number: [id number] Date of Birth/Sex: Treating RN: 04-10-1956 (66 y.o. Female) [name] Primary Care Rosie Golson: [name] Other Clinician: Referring Wenceslao Loper: Treating Damari Suastegui/Extender: Cheree Ditto in Treatment: 0 Wound Status Wound Number: 15 Primary Diabetic Wound/Ulcer of the Lower Extremity Etiology: Wound Location: Right, Distal, Lateral Foot Wound Open Wounding Event: Gradually Appeared Status: Date Acquired: 09/29/2021 Comorbid Cataracts, Anemia, Sleep Apnea, Coronary Artery Disease, Weeks Of Treatment: 0 History: Hypertension, Myocardial Infarction, Type II Diabetes, Clustered Wound: No Osteoarthritis, Osteomyelitis, Neuropathy Photos Wound Measurements Length: (cm) 1.6 Width: (cm) 2.2 Depth: (cm) 1 Area: (cm) 2.765 Volume: (cm) 2.765 % Reduction in Area: 0% % Reduction in Volume: 0% Epithelialization: None Tunneling: No Undermining: No Wound Description Classification: Grade 2 Wound Margin: Flat and Intact Exudate Amount: Medium Exudate Type: Purulent Exudate Color: yellow, brown, green Foul Odor After Cleansing: No Slough/Fibrino Yes Wound Bed Granulation Amount: Small (1-33%) Exposed Structure Granulation Quality: Pink, Pale Fascia Exposed: No Necrotic Amount: Large (67-100%) Fat Layer (Subcutaneous Tissue) Exposed: Yes Necrotic Quality: Adherent Slough Tendon Exposed: No Muscle Exposed: No Joint Exposed: No Bone Exposed: No Treatment Notes Wound #15 (Foot) Wound Laterality: Right, Lateral, Distal Cleanser Normal Saline Discharge Instruction: Cleanse the wound with Normal Saline prior to applying a clean dressing using gauze sponges, not tissue or cotton balls. Wound Cleanser Discharge Instruction: Cleanse the wound with wound cleanser prior to applying a clean dressing using gauze sponges, not tissue or cotton balls. Peri-Wound Care Topical Primary Dressing KerraCel Ag Gelling Fiber Dressing, 4x5 in (silver  alginate) Discharge Instruction: Apply silver alginate to wound bed as instructed Secondary Dressing Woven Gauze Sponge, Non-Sterile 4x4 in Discharge Instruction: Apply over primary dressing as directed. ABD Pad, 5x9 Discharge Instruction: Apply over primary dressing as directed. Secured With [employer], 4.5x3.1 (in/yd) Discharge Instruction: Secure with Kerlix as directed. 52M Medipore Soft Cloth Surgical T 2x10 (in/yd) ape Discharge Instruction: Secure with tape as directed. Compression Wrap Compression Stockings Add-Ons Electronic Signature(s) Signed: 11/29/2021 5:37:15 PM By: [name] RN, BSN Signed: 11/30/2021 9:58:51 AM By: [name] Entered By: [name] on 11/29/2021 13:39:06 -------------------------------------------------------------------------------- Vitals Details Patient Name: Date of Service: [name], [name] 11/29/2021 1:15 PM Medical Record Number: [id number] Patient Account Number: [id number] Date of Birth/Sex: Treating RN: 22-Sep-1956 (66 y.o. Female) [name] Primary Care Jasie Meleski: [name] Other Clinician: Referring Genesis Paget: Treating Vandell Kun/Extender: Cheree Ditto in Treatment: 0 Vital Signs Time Taken: 13:03 Temperature (F): 97.9 Height (in): 61 Pulse (bpm): 62 Source: Stated Respiratory Rate (breaths/min): 18 Weight (lbs): 169 Blood Pressure (  mmHg): 122/58 Source: Stated Reference Range: 80 - 120 mg / dl Body Mass Index (BMI): 31.9 Electronic Signature(s) Signed: 11/29/2021 5:37:15 PM By: [name] RN, BSN Entered By: [name] on 11/29/2021 13:22:32

## 2021-11-30 NOTE — Telephone Encounter (Signed)
Called patient to verify if PICC line was pulled. I left a hippa compliant vm with both th epatient and spouse asking that they call Dr Hale Bogus office to update [name] on PICC status.I did also ask them yesterday while here for OV to call us and give update once transfusion is complete.

## 2021-11-30 NOTE — Telephone Encounter (Signed)
Elmyra Ricks with [facility] called to get orders to remove patient's picc line. I advised her that patient is due to have a blood transfusion today and picc can be removed after. Also advised Elmyra Ricks that the patient may get the picc removed while she is at the cancer center getting the infusion. Verbal orders given to Gari Crown, Production assistant, radio with [facility] to remove picc if the cancer center does not today after her appointment. [name] [name]

## 2021-12-01 ENCOUNTER — Other Ambulatory Visit: Payer: Self-pay

## 2021-12-01 ENCOUNTER — Encounter (HOSPITAL_BASED_OUTPATIENT_CLINIC_OR_DEPARTMENT_OTHER): Payer: Self-pay | Admitting: *Deleted

## 2021-12-01 ENCOUNTER — Inpatient Hospital Stay (HOSPITAL_BASED_OUTPATIENT_CLINIC_OR_DEPARTMENT_OTHER): Payer: PRIVATE HEALTH INSURANCE

## 2021-12-01 ENCOUNTER — Telehealth: Payer: Self-pay

## 2021-12-01 ENCOUNTER — Ambulatory Visit (HOSPITAL_BASED_OUTPATIENT_CLINIC_OR_DEPARTMENT_OTHER)
Admission: RE | Admit: 2021-12-01 | Discharge: 2021-12-01 | Disposition: A | Discharge status: Home / Self Care | Payer: PRIVATE HEALTH INSURANCE | Source: Ambulatory Visit | Attending: Hematology & Oncology | Admitting: Hematology & Oncology

## 2021-12-01 ENCOUNTER — Emergency Department (HOSPITAL_BASED_OUTPATIENT_CLINIC_OR_DEPARTMENT_OTHER)
Admission: EM | Admit: 2021-12-01 | Discharge: 2021-12-01 | Discharge status: Against Medical Advice | Payer: PRIVATE HEALTH INSURANCE | Attending: Emergency Medicine | Admitting: Emergency Medicine

## 2021-12-01 ENCOUNTER — Inpatient Hospital Stay: Payer: PRIVATE HEALTH INSURANCE

## 2021-12-01 VITALS — BP 171/73 | HR 75 | Temp 98.8°F | Resp 16

## 2021-12-01 DIAGNOSIS — E1122 Type 2 diabetes mellitus with diabetic chronic kidney disease: Secondary | ICD-10-CM | POA: Diagnosis not present

## 2021-12-01 DIAGNOSIS — Z794 Long term (current) use of insulin: Secondary | ICD-10-CM | POA: Insufficient documentation

## 2021-12-01 DIAGNOSIS — I130 Hypertensive heart and chronic kidney disease with heart failure and stage 1 through stage 4 chronic kidney disease, or unspecified chronic kidney disease: Secondary | ICD-10-CM | POA: Insufficient documentation

## 2021-12-01 DIAGNOSIS — Z7984 Long term (current) use of oral hypoglycemic drugs: Secondary | ICD-10-CM | POA: Diagnosis not present

## 2021-12-01 DIAGNOSIS — R0902 Hypoxemia: Secondary | ICD-10-CM | POA: Insufficient documentation

## 2021-12-01 DIAGNOSIS — D649 Anemia, unspecified: Secondary | ICD-10-CM

## 2021-12-01 DIAGNOSIS — Z79899 Other long term (current) drug therapy: Secondary | ICD-10-CM | POA: Diagnosis not present

## 2021-12-01 DIAGNOSIS — U071 COVID-19: Secondary | ICD-10-CM | POA: Insufficient documentation

## 2021-12-01 DIAGNOSIS — D508 Other iron deficiency anemias: Secondary | ICD-10-CM | POA: Diagnosis not present

## 2021-12-01 DIAGNOSIS — Z7901 Long term (current) use of anticoagulants: Secondary | ICD-10-CM | POA: Insufficient documentation

## 2021-12-01 DIAGNOSIS — D500 Iron deficiency anemia secondary to blood loss (chronic): Secondary | ICD-10-CM

## 2021-12-01 DIAGNOSIS — R0603 Acute respiratory distress: Secondary | ICD-10-CM

## 2021-12-01 DIAGNOSIS — I509 Heart failure, unspecified: Secondary | ICD-10-CM | POA: Insufficient documentation

## 2021-12-01 DIAGNOSIS — N189 Chronic kidney disease, unspecified: Secondary | ICD-10-CM | POA: Diagnosis not present

## 2021-12-01 DIAGNOSIS — R0602 Shortness of breath: Secondary | ICD-10-CM | POA: Diagnosis present

## 2021-12-01 DIAGNOSIS — E11649 Type 2 diabetes mellitus with hypoglycemia without coma: Secondary | ICD-10-CM | POA: Diagnosis not present

## 2021-12-01 DIAGNOSIS — I2510 Atherosclerotic heart disease of native coronary artery without angina pectoris: Secondary | ICD-10-CM | POA: Insufficient documentation

## 2021-12-01 DIAGNOSIS — E162 Hypoglycemia, unspecified: Secondary | ICD-10-CM

## 2021-12-01 LAB — BASIC METABOLIC PANEL
Anion gap: 9 (ref 5–15)
BUN: 58 mg/dL — ABNORMAL HIGH (ref 8–23)
CO2: 21 mmol/L — ABNORMAL LOW (ref 22–32)
Calcium: 7.3 mg/dL — ABNORMAL LOW (ref 8.9–10.3)
Chloride: 107 mmol/L (ref 98–111)
Creatinine, Ser: 3.96 mg/dL — ABNORMAL HIGH (ref 0.44–1.00)
GFR, Estimated: 12 mL/min — ABNORMAL LOW (ref >60)
Glucose, Bld: 60 mg/dL — ABNORMAL LOW (ref 70–99)
Potassium: 4.1 mmol/L (ref 3.5–5.1)
Sodium: 137 mmol/L (ref 135–145)

## 2021-12-01 LAB — CBC WITH DIFFERENTIAL/PLATELET
Abs Immature Granulocytes: 0.02 10*3/uL (ref 0.00–0.07)
Basophils Absolute: 0 10*3/uL (ref 0.0–0.1)
Basophils Relative: 0 %
Eosinophils Absolute: 0.1 10*3/uL (ref 0.0–0.5)
Eosinophils Relative: 4 %
HCT: 22 % — ABNORMAL LOW (ref 36.0–46.0)
Hemoglobin: 7 g/dL — ABNORMAL LOW (ref 12.0–15.0)
Immature Granulocytes: 1 %
Lymphocytes Relative: 22 %
Lymphs Abs: 0.6 10*3/uL — ABNORMAL LOW (ref 0.7–4.0)
MCH: 27.7 pg (ref 26.0–34.0)
MCHC: 31.8 g/dL (ref 30.0–36.0)
MCV: 87 fL (ref 80.0–100.0)
Monocytes Absolute: 0.2 10*3/uL (ref 0.1–1.0)
Monocytes Relative: 8 %
Neutro Abs: 1.8 10*3/uL (ref 1.7–7.7)
Neutrophils Relative %: 65 %
Platelets: 110 10*3/uL — ABNORMAL LOW (ref 150–400)
RBC: 2.53 MIL/uL — ABNORMAL LOW (ref 3.87–5.11)
RDW: 17.5 % — ABNORMAL HIGH (ref 11.5–15.5)
Smear Review: DECREASED
WBC: 2.8 10*3/uL — ABNORMAL LOW (ref 4.0–10.5)
nRBC: 0 % (ref 0.0–0.2)

## 2021-12-01 LAB — I-STAT ARTERIAL BLOOD GAS, ED
Acid-base deficit: 4 mmol/L — ABNORMAL HIGH (ref 0.0–2.0)
Bicarbonate: 21.3 mmol/L (ref 20.0–28.0)
Calcium, Ion: 1.11 mmol/L — ABNORMAL LOW (ref 1.15–1.40)
HCT: 19 % — ABNORMAL LOW (ref 36.0–46.0)
Hemoglobin: 6.5 g/dL — CL (ref 12.0–15.0)
O2 Saturation: 92 %
Potassium: 4.2 mmol/L (ref 3.5–5.1)
Sodium: 141 mmol/L (ref 135–145)
TCO2: 22 mmol/L (ref 22–32)
pCO2 arterial: 36.6 mmHg (ref 32.0–48.0)
pH, Arterial: 7.373 (ref 7.350–7.450)
pO2, Arterial: 64 mmHg — ABNORMAL LOW (ref 83.0–108.0)

## 2021-12-01 LAB — FERRITIN: Ferritin: 2251 ng/mL — ABNORMAL HIGH (ref 11–307)

## 2021-12-01 LAB — RESP PANEL BY RT-PCR (FLU A&B, COVID) ARPGX2
Influenza A by PCR: NEGATIVE
Influenza B by PCR: NEGATIVE
SARS Coronavirus 2 by RT PCR: POSITIVE — AB

## 2021-12-01 LAB — CBG MONITORING, ED: Glucose-Capillary: 73 mg/dL (ref 70–99)

## 2021-12-01 LAB — IRON AND IRON BINDING CAPACITY (CC-WL,HP ONLY)
Iron: 23 ug/dL — ABNORMAL LOW (ref 28–170)
Saturation Ratios: 14 % (ref 10.4–31.8)
TIBC: 162 ug/dL — ABNORMAL LOW (ref 250–450)
UIBC: 139 ug/dL — ABNORMAL LOW (ref 148–442)

## 2021-12-01 LAB — BRAIN NATRIURETIC PEPTIDE: B Natriuretic Peptide: 860.9 pg/mL — ABNORMAL HIGH (ref 0.0–100.0)

## 2021-12-01 MED ORDER — SODIUM CHLORIDE 0.9 % IV SOLN: 100.0000 mg | Freq: Every day | INTRAVENOUS | Status: DC

## 2021-12-01 MED ORDER — FUROSEMIDE 10 MG/ML IJ SOLN
40.0000 mg | Freq: Once | INTRAMUSCULAR | Status: AC
  Administered 2021-12-01: 40 mg via INTRAMUSCULAR

## 2021-12-01 MED ORDER — SODIUM CHLORIDE 0.9% IV SOLUTION
250.0000 mL | Freq: Once | INTRAVENOUS | Status: AC
  Administered 2021-12-01: 250 mL via INTRAVENOUS

## 2021-12-01 MED ORDER — SODIUM CHLORIDE 0.9 % IV SOLN: Freq: Once | INTRAVENOUS | Status: DC

## 2021-12-01 MED ORDER — SODIUM CHLORIDE 0.9% FLUSH: 10.0000 mL | Freq: Once | INTRAVENOUS | Status: DC | PRN

## 2021-12-01 MED ORDER — DIPHENHYDRAMINE HCL 25 MG PO CAPS
25.0000 mg | ORAL_CAPSULE | Freq: Once | ORAL | Status: AC
  Administered 2021-12-01: 25 mg via ORAL
  Filled 2021-12-01: qty 1

## 2021-12-01 MED ORDER — SODIUM CHLORIDE 0.9 % IV SOLN
100.0000 mg | Freq: Once | INTRAVENOUS | Status: AC
  Administered 2021-12-01: 100 mg via INTRAVENOUS

## 2021-12-01 MED ORDER — FUROSEMIDE 10 MG/ML IJ SOLN: 20.0000 mg | Freq: Once | INTRAMUSCULAR | Status: DC

## 2021-12-01 MED ORDER — SODIUM CHLORIDE 0.9 % IV SOLN
200.0000 mg | Freq: Once | INTRAVENOUS | Status: AC
  Administered 2021-12-01: 200 mg via INTRAVENOUS
  Filled 2021-12-01: qty 200

## 2021-12-01 MED ORDER — HEPARIN SOD (PORK) LOCK FLUSH 100 UNIT/ML IV SOLN
250.0000 [IU] | INTRAVENOUS | Status: AC | PRN
  Administered 2021-12-01: 250 [IU]

## 2021-12-01 MED ORDER — SODIUM CHLORIDE 0.9% FLUSH: 10.0000 mL | INTRAVENOUS | Status: DC | PRN

## 2021-12-01 MED ORDER — MOLNUPIRAVIR EUA 200MG CAPSULE: 4.0000 | ORAL_CAPSULE | Freq: Two times a day (BID) | ORAL | 0 refills | Status: AC

## 2021-12-01 MED ORDER — SODIUM CHLORIDE 0.9% FLUSH
3.0000 mL | Freq: Once | INTRAVENOUS | Status: AC | PRN
  Administered 2021-12-01: 3 mL

## 2021-12-01 MED ORDER — ACETAMINOPHEN 325 MG PO TABS
650.0000 mg | ORAL_TABLET | Freq: Once | ORAL | Status: AC
  Administered 2021-12-01: 650 mg via ORAL
  Filled 2021-12-01: qty 2

## 2021-12-01 MED ORDER — HEPARIN SOD (PORK) LOCK FLUSH 100 UNIT/ML IV SOLN: 500.0000 [IU] | Freq: Every day | INTRAVENOUS | Status: DC | PRN

## 2021-12-01 MED ORDER — DEXAMETHASONE SODIUM PHOSPHATE 10 MG/ML IJ SOLN
6.0000 mg | Freq: Once | INTRAMUSCULAR | Status: AC
Start: 2021-12-01 — End: 2021-12-01
  Administered 2021-12-01: 6 mg via INTRAVENOUS
  Filled 2021-12-01: qty 1

## 2021-12-01 MED ORDER — FUROSEMIDE 10 MG/ML IJ SOLN
40.0000 mg | Freq: Once | INTRAMUSCULAR | Status: AC
Start: 2021-12-01 — End: 2021-12-01
  Administered 2021-12-01: 40 mg via INTRAVENOUS
  Filled 2021-12-01: qty 4

## 2021-12-01 NOTE — ED Notes (Signed)
At Oncology on 2nd Floor for blood transfusion secondary to anemia. Developed shortness of breath after rec only half unit of PRBC, lasix and CXR done by Oncology and then tx to our ED for further evaluation and care.

## 2021-12-01 NOTE — Telephone Encounter (Signed)
CRITICAL VALUE STICKER  CRITICAL VALUE: Hgb 6.6; platelets 94; RBC 2.54  RECEIVER (on-site recipient of call): Lucie Leather, [place] NOTIFIED: 12/01/21 @ [phone]  MESSENGER (representative from lab): Elmyra Ricks with [facility]  MD NOTIFIED: Jule Ser, DO  TIME OF NOTIFICATION: 12/01/21 @ 0844  RESPONSE:  No further orders at this time. Per Elmyra Ricks with [facility], she spoke with the patient this morning and the patient is scheduled for a blood transfusion this morning.  RN spoke with patient and confirmed she is going to [facility] for blood transfusion at [place], RN

## 2021-12-01 NOTE — ED Provider Notes (Signed)
[facility] EMERGENCY DEPARTMENT Provider Note   CSN: [id number] Arrival date & time: 12/01/21  1534     History  Chief Complaint  Patient presents with   Shortness of Breath    [name] [name] is a 66 y.o. adult.  66 year old female with past medical history of coronary artery disease, diabetes, chronic kidney disease, hypertension, liver disease presents from oncology floor where she was being seen for a blood transfusion and iron transfusion today.  Patient reports sudden onset of shortness of breath, found to have O2 sats in the 80s and was placed on supplemental O2.  She had a chest x-ray completed which was concerning for volume overload, given 40 mg of Lasix IV.  Patient completed her iron transfusion, the supplemental oxygen was discontinued and her O2 sat dropped to 85% again.  Decision was made for patient to be sent to the emergency room for further evaluation.  Patient states that she is having shortness of breath, denies chest pain, fevers.  States that she was recently admitted to the hospital and has had a mild cough since that time.  She has chronic wounds to both of her feet, states that her doctor wants to amputate her feet which she is trying to avoid this at this time.      Home Medications Prior to Admission medications   Medication Sig Start Date End Date Taking? Authorizing Provider  acetaminophen (TYLENOL) 500 MG tablet Take 1,000 mg by mouth every 6 (six) hours as needed for headache.    [provider]  apixaban (ELIQUIS) 5 MG TABS tablet Take 1 tablet (5 mg total) by mouth 2 (two) times daily. 10/22/21   Thurnell Lose, MD  atorvastatin (LIPITOR) 10 MG tablet Take 1 tablet by mouth once daily Patient not taking: Reported on 11/09/2021 07/28/19   Carollee Herter, Alferd Apa, DO  carvedilol (COREG) 6.25 MG tablet Take 1 tablet (6.25 mg total) by mouth 2 (two) times daily with a meal. 10/22/21   Thurnell Lose, MD  cefUROXime (CEFTIN) 250 MG  tablet Take 1 tablet (250 mg total) by mouth 2 (two) times daily with a meal. 11/29/21   Michel Bickers, MD  Continuous Blood Gluc Receiver (FREESTYLE LIBRE 14 DAY READER) DEVI 1 Device by Does not apply route daily. 03/01/20   Copland, Gay Filler, MD  Continuous Blood Gluc Sensor (FREESTYLE LIBRE 14 DAY SENSOR) MISC 1 Device by Does not apply route daily. 03/01/20   Copland, Gay Filler, MD  diazepam (VALIUM) 5 MG tablet Take 1 tablet (5 mg total) by mouth every 12 (twelve) hours as needed for anxiety. 06/24/20   Ann Held, DO  DULoxetine (CYMBALTA) 60 MG capsule Take 1 capsule by mouth once daily Patient taking differently: Take 60 mg by mouth daily. 06/20/21   [name] R, DO  ferrous sulfate 325 (65 FE) MG tablet Take 1 tablet (325 mg total) by mouth 2 (two) times daily with a meal. 10/22/21   Thurnell Lose, MD  folic acid (FOLVITE) 1 MG tablet Take 2 tablets by mouth once daily 11/11/21   Volanda Napoleon, MD  furosemide (LASIX) 20 MG tablet Take 1 tablet (20 mg total) by mouth daily. Patient taking differently: Take 40 mg by mouth daily. 10/07/21   Richardo Priest, MD  HYDROcodone-acetaminophen (NORCO/VICODIN) 5-325 MG tablet Take 1 tablet by mouth every 6 (six) hours as needed for moderate pain. 11/04/21   [name] R, DO  insulin  glargine (LANTUS SOLOSTAR) 100 UNIT/ML Solostar Pen INJECT 20 UNITS SUBCUTANEOUSLY IN THE MORNING AND 40 AT NIGHT Patient taking differently: 15-18 Units at bedtime. 05/18/21   Ann Held, DO  metroNIDAZOLE (FLAGYL) 500 MG tablet Take 1 tablet (500 mg total) by mouth 3 (three) times daily. 11/29/21   Michel Bickers, MD  montelukast (SINGULAIR) 10 MG tablet TAKE 1 TABLET BY MOUTH AT BEDTIME Patient taking differently: Take 10 mg by mouth at bedtime. 07/13/21   Ann Held, DO  Multiple Vitamin (MULTIVITAMIN WITH MINERALS) TABS tablet Take 1 tablet by mouth daily. 02/18/20   Angiulli, Lavon Paganini, PA-C  NONFORMULARY OR COMPOUNDED  ITEM Please dispense 1 L border of normal saline, ABD pads, Kerlix roll, 30 gauze pieces, 1 roll of paper tape for twice a day following dressing change, gently clean lower extremity wounds with Normal Saline, pat dry. Cover with dry dressings (ABD pads) and secure with Kerlix roll gauze/paper tape 10/22/21   Thurnell Lose, MD  ondansetron (ZOFRAN) 8 MG tablet Take 0.5 tablets (4 mg total) by mouth every 8 (eight) hours as needed for nausea or vomiting. 10/22/21   Thurnell Lose, MD  pregabalin (LYRICA) 75 MG capsule TAKE 1 CAPSULE BY MOUTH THREE TIMES DAILY Patient taking differently: Take 75 mg by mouth 2 (two) times daily. 09/02/21   Ann Held, DO  proparacaine (ALCAINE) 0.5 % ophthalmic solution  11/09/21   [provider]  VITAMIN D PO Take 2 capsules by mouth daily.    [provider]  metFORMIN (GLUCOPHAGE) 1000 MG tablet Take 1,000 mg by mouth 2 (two) times daily with a meal.    01/10/12  [provider]  omeprazole (PRILOSEC) 20 MG capsule Take 20 mg by mouth daily.    01/10/12  [provider]      Allergies    Bee pollen, Fish-derived products, Mushroom extract complex, Penicillins, Rosemary oil, Shellfish allergy, Tomato, Acetaminophen, Aloe vera, Broccoli [brassica oleracea], Acyclovir and related, and Naproxen    Review of Systems   Review of Systems Negative except as per HPI Physical Exam Updated Vital Signs BP (!) 162/71    Pulse 67    Temp 98 F (36.7 C) (Oral)    Resp 19    Ht 5' 5"  (1.651 m)    Wt 93.8 kg    SpO2 100%    BMI 34.41 kg/m  Physical Exam  ED Results / Procedures / Treatments   Labs (all labs ordered are listed, but only abnormal results are displayed) Labs Reviewed  RESP PANEL BY RT-PCR (FLU A&B, COVID) ARPGX2 - Abnormal; Notable for the following components:      Result Value   SARS Coronavirus 2 by RT PCR POSITIVE (*)    All other components within normal limits  BASIC METABOLIC PANEL - Abnormal;  Notable for the following components:   CO2 21 (*)    Glucose, Bld 60 (*)    BUN 58 (*)    Creatinine, Ser 3.96 (*)    Calcium 7.3 (*)    GFR, Estimated 12 (*)    All other components within normal limits  CBC WITH DIFFERENTIAL/PLATELET - Abnormal; Notable for the following components:   WBC 2.8 (*)    RBC 2.53 (*)    Hemoglobin 7.0 (*)    HCT 22.0 (*)    RDW 17.5 (*)    Platelets 110 (*)    Lymphs Abs 0.6 (*)    All other components  within normal limits  BRAIN NATRIURETIC PEPTIDE - Abnormal; Notable for the following components:   B Natriuretic Peptide 860.9 (*)    All other components within normal limits  I-STAT ARTERIAL BLOOD GAS, ED - Abnormal; Notable for the following components:   pO2, Arterial 64 (*)    Acid-base deficit 4.0 (*)    Calcium, Ion 1.11 (*)    HCT 19.0 (*)    Hemoglobin 6.5 (*)    All other components within normal limits  CBG MONITORING, ED    EKG None  Radiology DG Chest 2 View  Result Date: 12/01/2021 CLINICAL DATA:  Shortness of breath EXAM: CHEST - 2 VIEW COMPARISON:  10/16/2021 FINDINGS: Transverse diameter of heart is increased. There is marked interval increase in pulmonary vascular congestion. Increased interstitial markings are seen in the parahilar regions and lower lung fields suggesting pulmonary edema. Possibility of interstitial pneumonia is not excluded. Small bilateral pleural effusions are seen. There is no pneumothorax. Tip of central venous catheter is seen in the superior vena cava. There is previous surgical fusion in the thoracolumbar spine. Old malunited fracture is seen in the left clavicle. IMPRESSION: Cardiomegaly. There is marked prominence of central pulmonary vessels suggesting CHF. Pulmonary edema. Small bilateral pleural effusions. Electronically Signed   By: Elmer Picker M.D.   On: 12/01/2021 12:23    Procedures Procedures    Medications Ordered in ED Medications  dexamethasone (DECADRON) injection 6 mg (has no  administration in time range)  remdesivir 100 mg in sodium chloride 0.9 % 100 mL IVPB (has no administration in time range)  remdesivir 100 mg in sodium chloride 0.9 % 100 mL IVPB (has no administration in time range)    Followed by  remdesivir 100 mg in sodium chloride 0.9 % 100 mL IVPB (has no administration in time range)  furosemide (LASIX) injection 40 mg (40 mg Intravenous Given 12/01/21 1702)    ED Course/ Medical Decision Making/ A&P                           Medical Decision Making Amount and/or Complexity of Data Reviewed Labs: ordered.   This patient presents to the ED for concern of shortness of breath, hypoxia, sudden onset while receiving her blood and iron transfusions today, this involves an extensive number of treatment options, and is a complaint that carries with it a high risk of complications and morbidity.  The differential diagnosis includes but not limited to but not limited to flash pulmonary edema, transfusion reaction, CHF, PE, pneumonia, viral illness   Co morbidities that complicate the patient evaluation  CHF, EF was a documented 78 to 60%; CAD, diabetes, CKD with kidney transplant, on immunosuppressive therapy, DVT (on Eliquis), osteomyelitis of the feet currently on PICC line antibiotics   Additional history obtained:  Additional history obtained from husband at bedside External records from outside source obtained and reviewed including recent discharge summary dated 10/22/2021, admitted for sepsis secondary to osteomyelitis in her feet   Lab Tests:  I Ordered, and personally interpreted labs.  The pertinent results include: ABG concerning for PO2 of 64, obtained on 4 L nasal cannula.  CBC with hemoglobin of 7.0, improved from prior 6.5, WBC 2.8, no significant change from baseline.  BMP with creatinine at baseline of 3.96.  Glucose low at 60, patient was given orange juice with sugar with improvement to 73.  BNP elevated 860.  COVID test  positive.   Imaging Studies ordered:  Chest x-ray obtained earlier today as follows: IMPRESSION: Cardiomegaly. There is marked prominence of central pulmonary vessels suggesting CHF. Pulmonary edema. Small bilateral pleural effusions.   Cardiac Monitoring:  The patient was maintained on a cardiac monitor.  I personally viewed and interpreted the cardiac monitored which showed an underlying rhythm of: Sinus rhythm   Medicines ordered and prescription drug management:  I ordered medication including Decadron for hypoxia secondary to COVID also likely from acute on chronic CHF.  Given IV Lasix, 40 mg (already obtained 40 mg from oncology). Reevaluation of the patient after these medicines showed that the patient improved I have reviewed the patients home medicines and have made adjustments as needed   Critical Interventions:  Oxygen for hypoxemia, orange juice and sugar for hypoglycemia   Consultations Obtained:  Discussed with Dr. Kathrynn Humble, ER attending.  Plan is for admission to the ICU however patient is refusing admission and would like to leave AMA with home O2.  She understands this is not in her best interest as she needs admission for further monitoring, IV diuresis and supplemental oxygen.  Problem List / ED Course:  66 year old female arrives in respiratory distress from oncology clinic following transfusion of 1 unit of packed red blood cells and iron today.  Chest x-ray obtained while in oncology showed cardiomegaly with marked prominence of central pulmonary vessels suggesting CHF.  Pulmonary edema.  Small bilateral pleural effusions.  Patient was given 40 mg of Lasix by oncology and states that she did void prior to coming down to the ER.  O2 sat of 77% on room air on arrival, improved with 4 L nasal cannula into the mid 90s and then down trended to the low 90s and patient was placed on 5 L with improvement. Chronic wounds to her feet, refuses to allow for dressing  removal at this time, states this is managed by her home health nurse, on PICC line antibiotics currently. States that she has had a cough since leaving the hospital on her October 22, 2021 admission, at that time she screened negative for COVID however her COVID test is positive today.  Due to her new oxygen requirement, she was given Decadron and remdesivir ordered per pharmacy consult which is pending. Patient was given additional 40 mg of Lasix.   Discussed results and plan for admission to the ICU with patient and her husband who is at the bedside however patient is refusing admission to the hospital, states that she has a home health nurse at home who is able to care for her and would like oxygen so that she can go home.  She understands that she needs IV diuresis, monitoring, may need further treatment for her COVID including additional steroids and antivirals. Care signed out to Dr. Kathrynn Humble, ER attending who has seen the patient pending assistance from social work to obtain home oxygen so that patient may leave AMA if she chooses to do so once arrangements have all been made.   Reevaluation:  After the interventions noted above, I reevaluated the patient and found that they have :improved   Social Determinants of Health:  Has access to in home health nurse care   Dispostion:  After consideration of the diagnostic results and the patients response to treatment, I feel that the patent would benefit from admission to the ICU, at time of sign out, patient requesting to leave AMA.          Final Clinical Impression(s) / ED Diagnoses Final diagnoses:  Respiratory distress  Hypoxia  Acute on chronic congestive heart failure, unspecified heart failure type ([place])  Hypoglycemia  Anemia, unspecified type    Rx / DC Orders ED Discharge Orders     None         Tacy Learn, PA-C 12/01/21 Sherrilee Gilles, MD 12/01/21 2124

## 2021-12-01 NOTE — ED Triage Notes (Signed)
Sob for a month. She has a hx of CHF. She was sent her by her MD after exam and CXR.

## 2021-12-01 NOTE — Progress Notes (Signed)
I had to see [name] [name] back in the treatment area.  She has back getting a unit of blood along with some IV iron.  She has multiple medical problems.  She has chronic renal failure.  She has chronic liver disease.  She has hypertension.  She has coronary artery disease.  She has diabetes.  She has history of cellulitis and poorly healing ulcerations on her feet.  She does not have any oxygen at home.  She was getting 1 unit of blood.  Again she became short of breath.  She was seen.  She did not have hypotension.  She was really not tachycardic.  Her oxygen level went down to 82%.  She was placed on supplemental oxygen.  Her oxygen level went up to 95%.  We got a chest x-ray on her.  The chest x-ray showed pulmonary vascular congestion consistent with volume overload.  I am sure this was probably secondary to the blood transfusion and her renal insufficiency.  When I examined her, she had a lot of crackles and rhonchi bilaterally.  She had decent air movement but she sounded congested.  Her cardiac exam was regular rate and rhythm.  We watched her.  She got 40 of Lasix IV.  She did not have any diuresis with this.  When she had finished with her IV iron that we gave her, she still does not feel all that well.  She does not have any oxygen at home.  We took the supplemental oxygen off of her.  She desaturated down to 85%.  I felt that we really cannot let her go home.  She needs to be seen in the ER and maybe be admitted for volume overload.  Her last echocardiogram was done back in December which showed a ejection fraction of 55-60%.  She does have a litany of health issues.  I just feel that it would be best for her to be seen in the ER and possibly admitted for volume overload and diuresis.  I talked to her about this.  She is in agreement.  Again, she does not have oxygen at home.  I am sure she probably will need to have some oxygen at home at some point.  I know that she is trying hard.   Again I just think she has quite a few issues going against her that are making it difficult for her to get some extra volume off.  We called the ER downstairs in our building.  They will graciously see her and try to help with her management.  Hopefully she will be able to go home from the emergency room.  Again not having oxygen at home is a real problem.  Somehow she is going to need to get oxygen at home.  Lattie Haw, MD

## 2021-12-01 NOTE — ED Notes (Signed)
Pts O2 decreased to 3L/min via nasal cannula. O2 sats remained 99-100% with good waveform noted. Will continue to monitor.

## 2021-12-01 NOTE — Patient Instructions (Signed)
Blood Transfusion, Adult, Care After This sheet gives you information about how to care for yourself after your procedure. Your doctor may also give you more specific instructions. If you have problems or questions, contact your doctor. What can I expect after the procedure? After the procedure, it is common to have: Bruising and soreness at the IV site. A headache. Follow these instructions at home: Insertion site care   Follow instructions from your doctor about how to take care of your insertion site. This is where an IV tube was put into your vein. Make sure you: Wash your hands with soap and water before and after you change your bandage (dressing). If you cannot use soap and water, use hand sanitizer. Change your bandage as told by your doctor. Check your insertion site every day for signs of infection. Check for: Redness, swelling, or pain. Bleeding from the site. Warmth. Pus or a bad smell. General instructions Take over-the-counter and prescription medicines only as told by your doctor. Rest as told by your doctor. Go back to your normal activities as told by your doctor. Keep all follow-up visits as told by your doctor. This is important. Contact a doctor if: You have itching or red, swollen areas of skin (hives). You feel worried or nervous (anxious). You feel weak after doing your normal activities. You have redness, swelling, warmth, or pain around the insertion site. You have blood coming from the insertion site, and the blood does not stop with pressure. You have pus or a bad smell coming from the insertion site. Get help right away if: You have signs of a serious reaction. This may be coming from an allergy or the body's defense system (immune system). Signs include: Trouble breathing or shortness of breath. Swelling of the face or feeling warm (flushed). Fever or chills. Head, chest, or back pain. Dark pee (urine) or blood in the pee. Widespread rash. Fast  heartbeat. Feeling dizzy or light-headed. You may receive your blood transfusion in an outpatient setting. If so, you will be told whom to contact to report any reactions. These symptoms may be an emergency. Do not wait to see if the symptoms will go away. Get medical help right away. Call your local emergency services (911 in the [place] Do not drive yourself to the hospital. Summary Bruising and soreness at the IV site are common. Check your insertion site every day for signs of infection. Rest as told by your doctor. Go back to your normal activities as told by your doctor. Get help right away if you have signs of a serious reaction. This information is not intended to replace advice given to you by your health care provider. Make sure you discuss any questions you have with your health care provider. Document Revised: 02/10/2021 Document Reviewed: 04/10/2019 Elsevier Patient Education  2022 [place].  Iron Sucrose Injection What is this medication? IRON SUCROSE (EYE ern SOO krose) treats low levels of iron (iron deficiency anemia) in people with kidney disease. Iron is a mineral that plays an important role in making red blood cells, which carry oxygen from your lungs to the rest of your body. This medicine may be used for other purposes; ask your health care provider or pharmacist if you have questions. COMMON BRAND NAME(S): Venofer What should I tell my care team before I take this medication? They need to know if you have any of these conditions: Anemia not caused by low iron levels Heart disease High levels of iron in the  blood Kidney disease Liver disease An unusual or allergic reaction to iron, other medications, foods, dyes, or preservatives Pregnant or trying to get pregnant Breast-feeding How should I use this medication? This medication is for infusion into a vein. It is given in a hospital or clinic setting. Talk to your care team about the use of this medication in  children. While this medication may be prescribed for children as young as 2 years for selected conditions, precautions do apply. Overdosage: If you think you have taken too much of this medicine contact a poison control center or emergency room at once. NOTE: This medicine is only for you. Do not share this medicine with others. What if I miss a dose? It is important not to miss your dose. Call your care team if you are unable to keep an appointment. What may interact with this medication? Do not take this medication with any of the following: Deferoxamine Dimercaprol Other iron products This medication may also interact with the following: Chloramphenicol Deferasirox This list may not describe all possible interactions. Give your health care provider a list of all the medicines, herbs, non-prescription drugs, or dietary supplements you use. Also tell them if you smoke, drink alcohol, or use illegal drugs. Some items may interact with your medicine. What should I watch for while using this medication? Visit your care team regularly. Tell your care team if your symptoms do not start to get better or if they get worse. You may need blood work done while you are taking this medication. You may need to follow a special diet. Talk to your care team. Foods that contain iron include: whole grains/cereals, dried fruits, beans, or peas, leafy green vegetables, and organ meats (liver, kidney). What side effects may I notice from receiving this medication? Side effects that you should report to your care team as soon as possible: Allergic reactions--skin rash, itching, hives, swelling of the face, lips, tongue, or throat Low blood pressure--dizziness, feeling faint or lightheaded, blurry vision Shortness of breath Side effects that usually do not require medical attention (report to your care team if they continue or are bothersome): Flushing Headache Joint pain Muscle pain Nausea Pain, redness, or  irritation at injection site This list may not describe all possible side effects. Call your doctor for medical advice about side effects. You may report side effects to FDA at 1-800-FDA-1088. Where should I keep my medication? This medication is given in a hospital or clinic and will not be stored at home. NOTE: This sheet is a summary. It may not cover all possible information. If you have questions about this medicine, talk to your doctor, pharmacist, or health care provider.  2022 Elsevier/Gold Standard (2021-03-11 00:00:00)

## 2021-12-01 NOTE — Discharge Instructions (Signed)
You are seen in the ER for shortness of breath.  As discussed, your COVID-19 test is positive.  In addition, your chest x-ray revealed significant fluid collection, and you requiring increased oxygen utilization.  We wanted to admit you to the hospital, you have declined the request for admission.  We have facilitated home oxygen.  We are also prescribing you antiviral medication for your COVID-19. Double your Lasix for the next 3 days.  Return to the ER if your symptoms get worse.

## 2021-12-01 NOTE — ED Notes (Signed)
[employer] [employer] arrived with pts home oxygen tanks, and home O2 concentrator.  Thorough education provided as to how to use equipment and how to contact [facility] [employer] in event of issues/concerns/questions regarding equipment.  All questions addressed, pt provided with paperwork and informed of prescription provided. Pt wheeled to ED exit in home wheelchair by husband.

## 2021-12-01 NOTE — Care Management (Signed)
[facility] ED RNCM  received consult concerning home oxygen need.  [facility] notified able to provided oxygen tonight for discharge home. Oxygen qualifying note placed along with home oxygen order.  Updated EDP and RN that patient can be discharged home with oxygen from the [facility] ED.

## 2021-12-01 NOTE — Progress Notes (Signed)
Patient was getting her first blood unit while she stated that she "cannot breathe" at 11:35. Blood transfusion was stopped, VS taken  (see flowsheet) O2 given at 8 l/min via nasal cannula and Dr. Marin Olp was notified. Lasix 40 mg per Dr. Antonieta Pert instructions was given IV at 1145. O2 saturation came to 100% and O2 decreased to 4l/min, patient stated "I am breathing better". After evaluating the patient Dr. Marin Olp ordered a stat chest Xray that showed CHF. Dr. Marin Olp decided to postpone the transfusion for a later date, but still wanted the patient to get Venofer over 40 minutes.Upon discharge Dr. Marin Olp was notified of VS. Patient does not have oxygen at home and Dr. Marin Olp wanted to get a reading 15 minutes after oxygen was stopped. After 15 minutes, O2 saturation was 85. Patient transferred to ED.

## 2021-12-01 NOTE — Progress Notes (Signed)
Pt returned from [place]  per MD: stop blood transfusion, give IV Iron. Pt vss.

## 2021-12-01 NOTE — Telephone Encounter (Signed)
Patient went for blood transfusion this morning and was sent to the ED for assessment due to O2 sats at 85% on room air. Will route to provider.   Beryle Flock, RN

## 2021-12-01 NOTE — ED Notes (Signed)
Pt O2 saturation on room air at rest noted to be 80% during triage. Pt non ambulatory at baseline, uses home wheelchair. Pt placed initially on 6L O2 via [place] with O2 saturation improvement to 100%.  O2 via [place] able to be weaned down to 3L O2 via [place] while at rest with 100% O2 saturation noted.

## 2021-12-01 NOTE — Telephone Encounter (Signed)
Patient has required multiple blood transfusions over the last six weeks. Dr. Takiah Maiden Salon requesting that oncology RN assess patient's veins to determine if IJ line can be removed and blood transfusions done peripherally.   Per Reginia Forts, RN patient does not have veins large enough for an 18g IV. Dr. Takhia Spoon Salon would like to keep the IJ in place for now due to inadequate peripheral access.   Beryle Flock, RN

## 2021-12-02 ENCOUNTER — Telehealth: Payer: Self-pay | Admitting: *Deleted

## 2021-12-02 ENCOUNTER — Ambulatory Visit: Payer: PRIVATE HEALTH INSURANCE | Admitting: Internal Medicine

## 2021-12-02 ENCOUNTER — Telehealth: Payer: Self-pay

## 2021-12-02 NOTE — Telephone Encounter (Addendum)
Elmyra Ricks from [facility] called to advise they DO NOT pull Central line. Advised that Dr Megan Salon has decided to keep line in due to upcoming multiple blood transfusions.

## 2021-12-02 NOTE — Telephone Encounter (Signed)
Call received from patient to inform this office that she tested positive for Covid-19 yesterday in the ER.  Dr. Marin Olp notified.

## 2021-12-04 LAB — TYPE AND SCREEN
ABO/RH(D): A NEG
Antibody Screen: NEGATIVE
Unit division: 0
Unit division: 0

## 2021-12-04 LAB — BPAM RBC
Blood Product Expiration Date: 202302192359
Blood Product Expiration Date: 202302192359
ISSUE DATE / TIME: 202302020806
ISSUE DATE / TIME: 202302020806
Unit Type and Rh: 600
Unit Type and Rh: 600

## 2021-12-05 ENCOUNTER — Other Ambulatory Visit: Payer: PRIVATE HEALTH INSURANCE

## 2021-12-05 ENCOUNTER — Ambulatory Visit: Payer: PRIVATE HEALTH INSURANCE

## 2021-12-06 ENCOUNTER — Ambulatory Visit: Payer: PRIVATE HEALTH INSURANCE | Admitting: Internal Medicine

## 2021-12-06 ENCOUNTER — Encounter (HOSPITAL_BASED_OUTPATIENT_CLINIC_OR_DEPARTMENT_OTHER): Payer: PRIVATE HEALTH INSURANCE | Admitting: Internal Medicine

## 2021-12-08 ENCOUNTER — Telehealth: Payer: Self-pay | Admitting: Family Medicine

## 2021-12-08 ENCOUNTER — Ambulatory Visit: Payer: PRIVATE HEALTH INSURANCE

## 2021-12-08 ENCOUNTER — Other Ambulatory Visit: Payer: PRIVATE HEALTH INSURANCE

## 2021-12-08 DIAGNOSIS — M8610 Other acute osteomyelitis, unspecified site: Secondary | ICD-10-CM

## 2021-12-08 DIAGNOSIS — Z981 Arthrodesis status: Secondary | ICD-10-CM

## 2021-12-08 DIAGNOSIS — G894 Chronic pain syndrome: Secondary | ICD-10-CM

## 2021-12-08 NOTE — Telephone Encounter (Signed)
Pt is asking for Hydrocodone   Last office visit: 08/16/21 Date of last refill: 11/04/21 Last refill amount: 120 +0 Follow up time period per chart: none scheduled.   Pt was in the hospital late 09/2021

## 2021-12-08 NOTE — Telephone Encounter (Signed)
Medication: HYDROcodone-acetaminophen (NORCO/VICODIN) 5-325 MG tablet  Has the patient contacted their pharmacy? Yes.   (If no, request that the patient contact the pharmacy for the refill.) (If yes, when and what did the pharmacy advise?)  Preferred Pharmacy(with phone number or street name):  [facility] [address] [place], [place] - [address]  [address], [place] [address]  Phone:  [phone]  Fax:  [phone]   Agent: Please be advised that RX refills may take up to 3 business days. We ask that you follow-up with your pharmacy.

## 2021-12-09 ENCOUNTER — Encounter: Admit: 2021-12-09 | Discharge: 2021-12-09 | Payer: MEDICARE

## 2021-12-09 DIAGNOSIS — E1129 Type 2 diabetes mellitus with other diabetic kidney complication: Secondary | ICD-10-CM

## 2021-12-09 MED ORDER — ACCU-CHEK GUIDE GLUCOSE METER MISC MISC
SUBCUTANEOUS | 0 refills | Status: AC
Start: 2021-12-09 — End: ?

## 2021-12-09 MED ORDER — HYDROCODONE-ACETAMINOPHEN 5-325 MG PO TABS: 1.0000 | ORAL_TABLET | Freq: Four times a day (QID) | ORAL | 0 refills | Status: DC | PRN

## 2021-12-09 NOTE — Telephone Encounter (Signed)
Medication regularly RF by PCP, PDMP okay, RF sent

## 2021-12-13 ENCOUNTER — Telehealth: Payer: Self-pay

## 2021-12-13 NOTE — Telephone Encounter (Signed)
Spoke with patient, relayed that Dr. Joren Rehm Salon would like to keep the line in at least another week and that it can be further discussed at her upcoming appointment with him. Patient verbalized understanding and has no further questions.   Beryle Flock, RN

## 2021-12-13 NOTE — Telephone Encounter (Signed)
Patient left voicemail wanting to know when someone can come to her home to remove her central line. Will route to provider.   Line will not be able to be removed in the home as it is an IJ and will need to be removed by IR.   Beryle Flock, RN

## 2021-12-14 ENCOUNTER — Inpatient Hospital Stay: Payer: PRIVATE HEALTH INSURANCE

## 2021-12-14 ENCOUNTER — Encounter: Admit: 2021-12-14 | Discharge: 2021-12-14 | Payer: MEDICARE

## 2021-12-19 ENCOUNTER — Ambulatory Visit (INDEPENDENT_AMBULATORY_CARE_PROVIDER_SITE_OTHER): Payer: PRIVATE HEALTH INSURANCE | Admitting: Family Medicine

## 2021-12-19 ENCOUNTER — Encounter: Payer: Self-pay | Admitting: Family Medicine

## 2021-12-19 VITALS — BP 154/80 | HR 85 | Temp 97.8°F | Resp 20 | Ht 65.0 in | Wt 206.7 lb

## 2021-12-19 DIAGNOSIS — I87099 Postthrombotic syndrome with other complications of unspecified lower extremity: Secondary | ICD-10-CM | POA: Diagnosis not present

## 2021-12-19 DIAGNOSIS — L03116 Cellulitis of left lower limb: Secondary | ICD-10-CM

## 2021-12-19 DIAGNOSIS — I10 Essential (primary) hypertension: Secondary | ICD-10-CM

## 2021-12-19 DIAGNOSIS — E1142 Type 2 diabetes mellitus with diabetic polyneuropathy: Secondary | ICD-10-CM

## 2021-12-19 DIAGNOSIS — E1165 Type 2 diabetes mellitus with hyperglycemia: Secondary | ICD-10-CM

## 2021-12-19 DIAGNOSIS — L03115 Cellulitis of right lower limb: Secondary | ICD-10-CM

## 2021-12-19 DIAGNOSIS — E039 Hypothyroidism, unspecified: Secondary | ICD-10-CM

## 2021-12-19 DIAGNOSIS — E11621 Type 2 diabetes mellitus with foot ulcer: Secondary | ICD-10-CM

## 2021-12-19 DIAGNOSIS — Z794 Long term (current) use of insulin: Secondary | ICD-10-CM

## 2021-12-19 DIAGNOSIS — A4902 Methicillin resistant Staphylococcus aureus infection, unspecified site: Secondary | ICD-10-CM

## 2021-12-19 DIAGNOSIS — I252 Old myocardial infarction: Secondary | ICD-10-CM

## 2021-12-19 DIAGNOSIS — D649 Anemia, unspecified: Secondary | ICD-10-CM | POA: Diagnosis not present

## 2021-12-19 DIAGNOSIS — E1122 Type 2 diabetes mellitus with diabetic chronic kidney disease: Secondary | ICD-10-CM

## 2021-12-19 DIAGNOSIS — N189 Chronic kidney disease, unspecified: Secondary | ICD-10-CM

## 2021-12-19 DIAGNOSIS — N185 Chronic kidney disease, stage 5: Secondary | ICD-10-CM

## 2021-12-19 DIAGNOSIS — N179 Acute kidney failure, unspecified: Secondary | ICD-10-CM

## 2021-12-19 DIAGNOSIS — F418 Other specified anxiety disorders: Secondary | ICD-10-CM

## 2021-12-19 DIAGNOSIS — L97424 Non-pressure chronic ulcer of left heel and midfoot with necrosis of bone: Secondary | ICD-10-CM

## 2021-12-19 MED ORDER — CARVEDILOL 6.25 MG PO TABS: 6.2500 mg | ORAL_TABLET | Freq: Two times a day (BID) | ORAL | 3 refills | Status: DC

## 2021-12-19 MED ORDER — DIAZEPAM 5 MG PO TABS: 5.0000 mg | ORAL_TABLET | Freq: Two times a day (BID) | ORAL | 0 refills | Status: DC | PRN

## 2021-12-19 MED ORDER — NONFORMULARY OR COMPOUNDED ITEM: 0 refills | Status: DC

## 2021-12-19 MED ORDER — APIXABAN 5 MG PO TABS: 5.0000 mg | ORAL_TABLET | Freq: Two times a day (BID) | ORAL | 0 refills | Status: DC

## 2021-12-19 NOTE — Assessment & Plan Note (Signed)
Per nephrology 

## 2021-12-19 NOTE — Assessment & Plan Note (Signed)
Per ID and ortho

## 2021-12-19 NOTE — Assessment & Plan Note (Signed)
Check labs 

## 2021-12-19 NOTE — Assessment & Plan Note (Signed)
Per endo °

## 2021-12-19 NOTE — Assessment & Plan Note (Addendum)
Poorly controlled will alter medications, encouraged DASH diet, minimize caffeine and obtain adequate sleep. Report concerning symptoms and follow up as directed and as needed Refill coreg--- pt has been out it for a while

## 2021-12-19 NOTE — Assessment & Plan Note (Signed)
Per ID and ortho Mobility exam done for powerwheelchair

## 2021-12-19 NOTE — Patient Instructions (Signed)
Carbohydrate Counting for Diabetes Mellitus, Adult °Carbohydrate counting is a method of keeping track of how many carbohydrates you eat. Eating carbohydrates increases the amount of sugar (glucose) in the blood. Counting how many carbohydrates you eat improves how well you manage your blood glucose. This, in turn, helps you manage your diabetes. °Carbohydrates are measured in grams (g) per serving. It is important to know how many carbohydrates (in grams or by serving size) you can have in each meal. This is different for every person. A dietitian can help you make a meal plan and calculate how many carbohydrates you should have at each meal and snack. °What foods contain carbohydrates? °Carbohydrates are found in the following foods: °Grains, such as breads and cereals. °Dried beans and soy products. °Starchy vegetables, such as potatoes, peas, and corn. °Fruit and fruit juices. °Milk and yogurt. °Sweets and snack foods, such as cake, cookies, candy, chips, and soft drinks. °How do I count carbohydrates in foods? °There are two ways to count carbohydrates in food. You can read food labels or learn standard serving sizes of foods. You can use either of these methods or a combination of both. °Using the Nutrition Facts label °The Nutrition Facts list is included on the labels of almost all packaged foods and beverages in the [place]. It includes: °The serving size. °Information about nutrients in each serving, including the grams of carbohydrate per serving. °To use the Nutrition Facts, decide how many servings you will have. Then, multiply the number of servings by the number of carbohydrates per serving. The resulting number is the total grams of carbohydrates that you will be having. °Learning the standard serving sizes of foods °When you eat carbohydrate foods that are not packaged or do not include Nutrition Facts on the label, you need to measure the servings in order to count the grams of  carbohydrates. °Measure the foods that you will eat with a food scale or measuring cup, if needed. °Decide how many standard-size servings you will eat. °Multiply the number of servings by 15. For foods that contain carbohydrates, one serving equals 15 g of carbohydrates. °For example, if you eat 2 cups or 10 oz (300 g) of strawberries, you will have eaten 2 servings and 30 g of carbohydrates (2 servings x 15 g = 30 g). °For foods that have more than one food mixed, such as soups and casseroles, you must count the carbohydrates in each food that is included. °The following list contains standard serving sizes of common carbohydrate-rich foods. Each of these servings has about 15 g of carbohydrates: °1 slice of bread. °1 six-inch (15 cm) tortilla. °? cup or 2 oz (53 g) cooked rice or pasta. °½ cup or 3 oz (85 g) cooked or canned, drained and rinsed beans or lentils. °½ cup or 3 oz (85 g) starchy vegetable, such as peas, corn, or squash. °½ cup or 4 oz (120 g) hot cereal. °½ cup or 3 oz (85 g) boiled or mashed potatoes, or ¼ or 3 oz (85 g) of a large baked potato. °½ cup or 4 fl oz (118 mL) fruit juice. °1 cup or 8 fl oz (237 mL) milk. °1 small or 4 oz (106 g) apple. °½ or 2 oz (63 g) of a medium banana. °1 cup or 5 oz (150 g) strawberries. °3 cups or 1 oz (28.3 g) popped popcorn. °What is an example of carbohydrate counting? °To calculate the grams of carbohydrates in this sample meal, follow the steps   shown below. °Sample meal °3 oz (85 g) chicken breast. °? cup or 4 oz (106 g) brown rice. °½ cup or 3 oz (85 g) corn. °1 cup or 8 fl oz (237 mL) milk. °1 cup or 5 oz (150 g) strawberries with sugar-free whipped topping. °Carbohydrate calculation °Identify the foods that contain carbohydrates: °Rice. °Corn. °Milk. °Strawberries. °Calculate how many servings you have of each food: °2 servings rice. °1 serving corn. °1 serving milk. °1 serving strawberries. °Multiply each number of servings by 15 g: °2 servings rice x 15  g = 30 g. °1 serving corn x 15 g = 15 g. °1 serving milk x 15 g = 15 g. °1 serving strawberries x 15 g = 15 g. °Add together all of the amounts to find the total grams of carbohydrates eaten: °30 g + 15 g + 15 g + 15 g = 75 g of carbohydrates total. °What are tips for following this plan? °Shopping °Develop a meal plan and then make a shopping list. °Buy fresh and frozen vegetables, fresh and frozen fruit, dairy, eggs, beans, lentils, and whole grains. °Look at food labels. Choose foods that have more fiber and less sugar. °Avoid processed foods and foods with added sugars. °Meal planning °Aim to have the same number of grams of carbohydrates at each meal and for each snack time. °Plan to have regular, balanced meals and snacks. °Where to find more information °American Diabetes Association: diabetes.org °Centers for Disease Control and Prevention: cdc.gov °Academy of Nutrition and Dietetics: eatright.org °Association of Diabetes Care & Education Specialists: diabeteseducator.org °Summary °Carbohydrate counting is a method of keeping track of how many carbohydrates you eat. °Eating carbohydrates increases the amount of sugar (glucose) in your blood. °Counting how many carbohydrates you eat improves how well you manage your blood glucose. This helps you manage your diabetes. °A dietitian can help you make a meal plan and calculate how many carbohydrates you should have at each meal and snack. °This information is not intended to replace advice given to you by your health care provider. Make sure you discuss any questions you have with your health care provider. °Document Revised: 05/19/2020 Document Reviewed: 05/19/2020 °Elsevier Patient Education © 2022 Elsevier Inc. ° °

## 2021-12-19 NOTE — Assessment & Plan Note (Signed)
rx power wheelchair
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
## 2021-12-20 ENCOUNTER — Encounter (HOSPITAL_BASED_OUTPATIENT_CLINIC_OR_DEPARTMENT_OTHER): Payer: PRIVATE HEALTH INSURANCE | Admitting: Internal Medicine

## 2021-12-20 ENCOUNTER — Telehealth: Payer: Self-pay | Admitting: *Deleted

## 2021-12-20 ENCOUNTER — Ambulatory Visit (INDEPENDENT_AMBULATORY_CARE_PROVIDER_SITE_OTHER): Payer: PRIVATE HEALTH INSURANCE | Admitting: Internal Medicine

## 2021-12-20 ENCOUNTER — Telehealth: Payer: Self-pay

## 2021-12-20 ENCOUNTER — Other Ambulatory Visit: Payer: Self-pay | Admitting: *Deleted

## 2021-12-20 ENCOUNTER — Encounter: Payer: Self-pay | Admitting: Internal Medicine

## 2021-12-20 ENCOUNTER — Other Ambulatory Visit: Payer: Self-pay

## 2021-12-20 DIAGNOSIS — N179 Acute kidney failure, unspecified: Secondary | ICD-10-CM

## 2021-12-20 DIAGNOSIS — L03115 Cellulitis of right lower limb: Secondary | ICD-10-CM

## 2021-12-20 DIAGNOSIS — L089 Local infection of the skin and subcutaneous tissue, unspecified: Secondary | ICD-10-CM

## 2021-12-20 DIAGNOSIS — E11628 Type 2 diabetes mellitus with other skin complications: Secondary | ICD-10-CM

## 2021-12-20 DIAGNOSIS — A4902 Methicillin resistant Staphylococcus aureus infection, unspecified site: Secondary | ICD-10-CM

## 2021-12-20 DIAGNOSIS — E11621 Type 2 diabetes mellitus with foot ulcer: Secondary | ICD-10-CM

## 2021-12-20 DIAGNOSIS — D649 Anemia, unspecified: Secondary | ICD-10-CM

## 2021-12-20 DIAGNOSIS — L97424 Non-pressure chronic ulcer of left heel and midfoot with necrosis of bone: Secondary | ICD-10-CM

## 2021-12-20 DIAGNOSIS — E1165 Type 2 diabetes mellitus with hyperglycemia: Secondary | ICD-10-CM

## 2021-12-20 LAB — COMPREHENSIVE METABOLIC PANEL
ALT: 4 U/L (ref 0–35)
AST: 10 U/L (ref 0–37)
Albumin: 2.9 g/dL — ABNORMAL LOW (ref 3.5–5.2)
Alkaline Phosphatase: 79 U/L (ref 39–117)
BUN: 26 mg/dL — ABNORMAL HIGH (ref 6–23)
CO2: 26 mEq/L (ref 19–32)
Calcium: 8.4 mg/dL (ref 8.4–10.5)
Chloride: 108 mEq/L (ref 96–112)
Creatinine, Ser: 2.75 mg/dL — ABNORMAL HIGH (ref 0.40–1.20)
GFR: 17.51 mL/min — ABNORMAL LOW (ref >60.00)
Glucose, Bld: 84 mg/dL (ref 70–99)
Potassium: 4.2 mEq/L (ref 3.5–5.1)
Sodium: 139 mEq/L (ref 135–145)
Total Bilirubin: 0.4 mg/dL (ref 0.2–1.2)
Total Protein: 7.7 g/dL (ref 6.0–8.3)

## 2021-12-20 LAB — CBC WITH DIFFERENTIAL/PLATELET
Basophils Absolute: 0 10*3/uL (ref 0.0–0.1)
Basophils Relative: 0.7 % (ref 0.0–3.0)
Eosinophils Absolute: 0.1 10*3/uL (ref 0.0–0.7)
Eosinophils Relative: 3 % (ref 0.0–5.0)
HCT: 23.6 % — CL (ref 36.0–46.0)
Hemoglobin: 7.5 g/dL — CL (ref 12.0–15.0)
Lymphocytes Relative: 13.6 % (ref 12.0–46.0)
Lymphs Abs: 0.6 10*3/uL — ABNORMAL LOW (ref 0.7–4.0)
MCHC: 32 g/dL (ref 30.0–36.0)
MCV: 88.4 fl (ref 78.0–100.0)
Monocytes Absolute: 0.4 10*3/uL (ref 0.1–1.0)
Monocytes Relative: 9.4 % (ref 3.0–12.0)
Neutro Abs: 3.4 10*3/uL (ref 1.4–7.7)
Neutrophils Relative %: 73.3 % (ref 43.0–77.0)
Platelets: 194 10*3/uL (ref 150.0–400.0)
RBC: 2.67 Mil/uL — ABNORMAL LOW (ref 3.87–5.11)
RDW: 18.8 % — ABNORMAL HIGH (ref 11.5–15.5)
WBC: 4.6 10*3/uL (ref 4.0–10.5)

## 2021-12-20 LAB — HEMOGLOBIN A1C: Hgb A1c MFr Bld: 4.6 % (ref 4.6–6.5)

## 2021-12-20 LAB — FERRITIN: Ferritin: 586.5 ng/mL — ABNORMAL HIGH (ref 10.0–291.0)

## 2021-12-20 NOTE — Telephone Encounter (Signed)
Pt was called. LVM to return call. Also spoke with Oncology. They will attempt to call patient and will make provider aware. Pt does have appt with them tomorrow. Dr. Etter Sjogren made aware verbally.

## 2021-12-20 NOTE — Telephone Encounter (Signed)
CRITICAL VALUE STICKER  CRITICAL VALUE:  Hgb -- 7.5,  Hct -- 23.6  RECEIVER (on-site recipient of call): Kelle Darting, [place] NOTIFIED: 12/20/21 @ 10:35am  MESSENGER (representative from lab): Shari Prows.  MD NOTIFIED: Garnet Koyanagi [place]  TIME OF NOTIFICATION: 10:40am  RESPONSE:

## 2021-12-20 NOTE — Telephone Encounter (Signed)
Call from pateints PCP office stating they received a panic HGB for pt and are unable to reach pt. Attempted to call patient, no answer, left her a message stating to call asap to see how she is feeling. HGB 7.5. she is scheduled to come in tomorrow 2/22 and can receive blood if needed. Also informed her to call back letting us know she received our messages.

## 2021-12-20 NOTE — Progress Notes (Signed)
Virtual Visit via Telephone Note  I connected with [name] [name] on 12/20/21 at  4:15 PM EST by telephone and verified that I am speaking with the correct person using two identifiers.  Location: Patient: Home Provider: [facility]   I discussed the limitations, risks, security and privacy concerns of performing an evaluation and management service by telephone and the availability of in person appointments. I also discussed with the patient that there may be a patient responsible charge related to this service. The patient expressed understanding and agreed to proceed.   History of Present Illness: I called and spoke with [name] [name] today.  She remains on cefuroxime and metronidazole for severe bilateral, deep foot infections.  Fortunately she is tolerating her antibiotics well.  She is no longer having any diarrhea.  She said that she is feeling much stronger than when she left the hospital last month.  She has not been able to follow-up at the wound center because she has so many other doctor appointments but she is scheduled to go back on March 7.  Her central line remains in because she is needed repeat blood transfusions.  Her hemoglobin yesterday remained low at 7.5.  She is scheduled to follow-up with Dr. Marin Olp at the cancer center tomorrow.   Observations/Objective:   Assessment and Plan: She seems to be doing better on her current antibiotic therapy but I am not sure that her deep infections are curable without further surgery.  She will continue antibiotics and follow-up here in 4 weeks.  I suggest leaving her central line in for now because she is likely to need further blood transfusions and possibly iron infusions.  Follow Up Instructions: Continue current antibiotics and follow-up in 4 weeks   I discussed the assessment and treatment plan with the patient. The patient was provided an opportunity to ask questions and all were answered. The patient agreed with the plan and  demonstrated an understanding of the instructions.   The patient was advised to call back or seek an in-person evaluation if the symptoms worsen or if the condition fails to improve as anticipated.  I provided 15 minutes of non-face-to-face time during this encounter.   Michel Bickers, MD

## 2021-12-20 NOTE — Telephone Encounter (Signed)
Patient notified of results and that she may receive a call from oncology.

## 2021-12-20 NOTE — Chronic Care Management (AMB) (Signed)
Chronic Care Management   Note  12/20/2021 Name: [name] [name] MRN: [id number] DOB: 02-Dec-1955  [name] [name] is a 66 y.o. year old adult who is a primary care patient of Ann Held, DO. I reached out to [employer] by phone today in response to a referral sent by Ms. Decatur Highfill's PCP.  Ms. Latu was given information about Chronic Care Management services today including:  CCM service includes personalized support from designated clinical staff supervised by her physician, including individualized plan of care and coordination with other care providers 24/7 contact phone numbers for assistance for urgent and routine care needs. Service will only be billed when office clinical staff spend 20 minutes or more in a month to coordinate care. Only one practitioner may furnish and bill the service in a calendar month. The patient may stop CCM services at any time (effective at the end of the month) by phone call to the office staff. The patient is responsible for co-pay (up to 20% after annual deductible is met) if co-pay is required by the individual health plan.   Patient agreed to services and verbal consent obtained.   Follow up plan: Telephone appointment with care management team member scheduled for: 12/28/2021  Julian Hy, [place] [facility]  Direct Dial: [phone]

## 2021-12-21 ENCOUNTER — Telehealth: Payer: Self-pay | Admitting: *Deleted

## 2021-12-21 ENCOUNTER — Other Ambulatory Visit: Payer: Self-pay

## 2021-12-21 ENCOUNTER — Other Ambulatory Visit: Payer: Self-pay | Admitting: Family

## 2021-12-21 ENCOUNTER — Inpatient Hospital Stay: Payer: PRIVATE HEALTH INSURANCE

## 2021-12-21 ENCOUNTER — Encounter: Payer: Self-pay | Admitting: Family

## 2021-12-21 ENCOUNTER — Inpatient Hospital Stay (HOSPITAL_BASED_OUTPATIENT_CLINIC_OR_DEPARTMENT_OTHER): Payer: PRIVATE HEALTH INSURANCE | Admitting: Family

## 2021-12-21 ENCOUNTER — Ambulatory Visit: Admit: 2021-12-21 | Discharge: 2021-12-22 | Payer: MEDICARE

## 2021-12-21 ENCOUNTER — Encounter: Admit: 2021-12-21 | Discharge: 2021-12-21 | Payer: MEDICARE

## 2021-12-21 ENCOUNTER — Ambulatory Visit: Admit: 2021-12-21 | Discharge: 2021-12-21 | Payer: MEDICARE

## 2021-12-21 VITALS — BP 161/78 | HR 86 | Temp 97.7°F | Resp 17

## 2021-12-21 VITALS — BP 170/87 | HR 76 | Ht 65.5 in | Wt 165.0 lb

## 2021-12-21 VITALS — BP 148/74 | HR 78

## 2021-12-21 DIAGNOSIS — D631 Anemia in chronic kidney disease: Secondary | ICD-10-CM

## 2021-12-21 DIAGNOSIS — D509 Iron deficiency anemia, unspecified: Secondary | ICD-10-CM | POA: Diagnosis not present

## 2021-12-21 DIAGNOSIS — D649 Anemia, unspecified: Secondary | ICD-10-CM | POA: Diagnosis not present

## 2021-12-21 DIAGNOSIS — D500 Iron deficiency anemia secondary to blood loss (chronic): Secondary | ICD-10-CM | POA: Diagnosis not present

## 2021-12-21 DIAGNOSIS — N183 Chronic kidney disease, stage 3 unspecified: Secondary | ICD-10-CM | POA: Diagnosis not present

## 2021-12-21 DIAGNOSIS — D508 Other iron deficiency anemias: Secondary | ICD-10-CM

## 2021-12-21 DIAGNOSIS — K589 Irritable bowel syndrome without diarrhea: Secondary | ICD-10-CM

## 2021-12-21 DIAGNOSIS — K2970 Gastritis, unspecified, without bleeding: Secondary | ICD-10-CM

## 2021-12-21 DIAGNOSIS — E785 Hyperlipidemia, unspecified: Secondary | ICD-10-CM

## 2021-12-21 DIAGNOSIS — I10 Essential (primary) hypertension: Secondary | ICD-10-CM

## 2021-12-21 DIAGNOSIS — G5600 Carpal tunnel syndrome, unspecified upper limb: Secondary | ICD-10-CM

## 2021-12-21 DIAGNOSIS — E0822 Diabetes mellitus due to underlying condition with diabetic chronic kidney disease: Secondary | ICD-10-CM

## 2021-12-21 DIAGNOSIS — E119 Type 2 diabetes mellitus without complications: Secondary | ICD-10-CM

## 2021-12-21 DIAGNOSIS — J309 Allergic rhinitis, unspecified: Secondary | ICD-10-CM

## 2021-12-21 DIAGNOSIS — J45909 Unspecified asthma, uncomplicated: Secondary | ICD-10-CM

## 2021-12-21 DIAGNOSIS — N1832 Stage 3b chronic kidney disease (HCC): Principal | ICD-10-CM

## 2021-12-21 DIAGNOSIS — E559 Vitamin D deficiency, unspecified: Secondary | ICD-10-CM

## 2021-12-21 DIAGNOSIS — N184 Chronic kidney disease, stage 4 (severe): Secondary | ICD-10-CM

## 2021-12-21 DIAGNOSIS — K5900 Constipation, unspecified: Secondary | ICD-10-CM

## 2021-12-21 DIAGNOSIS — M79642 Pain in left hand: Secondary | ICD-10-CM

## 2021-12-21 DIAGNOSIS — G9050 Complex regional pain syndrome I, unspecified: Secondary | ICD-10-CM

## 2021-12-21 LAB — COMPREHENSIVE METABOLIC PANEL
POTASSIUM: 3.8 MMOL/L (ref 3.5–5.1)
SODIUM: 141 MMOL/L (ref 137–147)

## 2021-12-21 LAB — URINALYSIS, MICROSCOPIC

## 2021-12-21 LAB — IRON + BINDING CAPACITY + %SAT+ FERRITIN
% SATURATION: 16 % — ABNORMAL LOW (ref 28–42)
FERRITIN: 141 ng/mL (ref 10–200)
IRON BINDING: 417 ug/dL — ABNORMAL HIGH (ref 270–380)
IRON: 65 ug/dL — ABNORMAL LOW (ref 50–160)

## 2021-12-21 LAB — CBC AND DIFF
ABSOLUTE BASO COUNT: 0 K/UL (ref 0–0.20)
ABSOLUTE EOS COUNT: 0.2 K/UL (ref 0–0.45)
ABSOLUTE LYMPH COUNT: 2.3 K/UL (ref 1.0–4.8)
ABSOLUTE MONO COUNT: 0.5 K/UL (ref 0–0.80)
BASOPHILS %: 1 % (ref 0–2)
MCHC: 33 g/dL (ref 32.0–36.0)
MONOCYTES %: 8 % (ref 4–12)
NEUTROPHILS %: 56 % (ref 41–77)
PLATELET COUNT: 280 K/UL (ref 150–400)
RBC COUNT: 4.1 M/UL — ABNORMAL HIGH (ref 4.0–5.0)
RDW: 15 % — ABNORMAL HIGH (ref 11–15)
WBC COUNT: 7.3 K/UL (ref 4.5–11.0)

## 2021-12-21 LAB — PROTEIN/CR RATIO,UR RAN
PROT CREAT RAT/CAL: 0.3 — ABNORMAL HIGH (ref <0.15)
UR CREATININE, RAN: 27 mg/dL
UR TOTAL PROTEIN,RAN: 9 mg/dL (ref 0–5)

## 2021-12-21 LAB — 25-OH VITAMIN D (D2 + D3): VITAMIN D (25-OH) TOTAL: 33 ng/mL (ref 30–80)

## 2021-12-21 LAB — PARATHYROID HORMONE: PTH HORMONE: 110 pg/mL — ABNORMAL HIGH (ref 10–65)

## 2021-12-21 LAB — PHOSPHORUS: PHOSPHORUS: 2.6 mg/dL (ref 2.0–4.5)

## 2021-12-21 LAB — CBC WITH DIFFERENTIAL (CANCER CENTER ONLY)
Abs Immature Granulocytes: 0.01 10*3/uL (ref 0.00–0.07)
Basophils Absolute: 0.1 10*3/uL (ref 0.0–0.1)
Basophils Relative: 1 %
Eosinophils Absolute: 0.2 10*3/uL (ref 0.0–0.5)
Eosinophils Relative: 3 %
HCT: 24.7 % — ABNORMAL LOW (ref 36.0–46.0)
Hemoglobin: 7.5 g/dL — ABNORMAL LOW (ref 12.0–15.0)
Immature Granulocytes: 0 %
Lymphocytes Relative: 13 %
Lymphs Abs: 0.7 10*3/uL (ref 0.7–4.0)
MCH: 28.1 pg (ref 26.0–34.0)
MCHC: 30.4 g/dL (ref 30.0–36.0)
MCV: 92.5 fL (ref 80.0–100.0)
Monocytes Absolute: 0.4 10*3/uL (ref 0.1–1.0)
Monocytes Relative: 9 %
Neutro Abs: 3.7 10*3/uL (ref 1.7–7.7)
Neutrophils Relative %: 74 %
Platelet Count: 191 10*3/uL (ref 150–400)
RBC: 2.67 MIL/uL — ABNORMAL LOW (ref 3.87–5.11)
RDW: 18.5 % — ABNORMAL HIGH (ref 11.5–15.5)
WBC Count: 5 10*3/uL (ref 4.0–10.5)
nRBC: 0 % (ref 0.0–0.2)

## 2021-12-21 LAB — SAMPLE TO BLOOD BANK

## 2021-12-21 LAB — CMP (CANCER CENTER ONLY)
ALT: <5 U/L (ref 0–44)
AST: 10 U/L — ABNORMAL LOW (ref 15–41)
Albumin: 2.7 g/dL — ABNORMAL LOW (ref 3.5–5.0)
Alkaline Phosphatase: 79 U/L (ref 38–126)
Anion gap: 6 (ref 5–15)
BUN: 27 mg/dL — ABNORMAL HIGH (ref 8–23)
CO2: 24 mmol/L (ref 22–32)
Calcium: 8.3 mg/dL — ABNORMAL LOW (ref 8.9–10.3)
Chloride: 108 mmol/L (ref 98–111)
Creatinine: 2.49 mg/dL — ABNORMAL HIGH (ref 0.44–1.00)
GFR, Estimated: 21 mL/min — ABNORMAL LOW (ref >60)
Glucose, Bld: 122 mg/dL — ABNORMAL HIGH (ref 70–99)
Potassium: 4.1 mmol/L (ref 3.5–5.1)
Sodium: 138 mmol/L (ref 135–145)
Total Bilirubin: 0.5 mg/dL (ref 0.3–1.2)
Total Protein: 7.3 g/dL (ref 6.5–8.1)

## 2021-12-21 LAB — RETICULOCYTES
Immature Retic Fract: 24.7 % — ABNORMAL HIGH (ref 2.3–15.9)
RBC.: 2.62 MIL/uL — ABNORMAL LOW (ref 3.87–5.11)
Retic Count, Absolute: 120.5 10*3/uL (ref 19.0–186.0)
Retic Ct Pct: 4.6 % — ABNORMAL HIGH (ref 0.4–3.1)

## 2021-12-21 LAB — IRON AND IRON BINDING CAPACITY (CC-WL,HP ONLY)
Iron: 47 ug/dL (ref 28–170)
Saturation Ratios: 22 % (ref 10.4–31.8)
TIBC: 218 ug/dL — ABNORMAL LOW (ref 250–450)
UIBC: 171 ug/dL (ref 148–442)

## 2021-12-21 LAB — FERRITIN: Ferritin: 693 ng/mL — ABNORMAL HIGH (ref 11–307)

## 2021-12-21 MED ORDER — CARVEDILOL 25 MG PO TAB
25 mg | ORAL_TABLET | Freq: Two times a day (BID) | ORAL | 3 refills | 90.00000 days | Status: AC
Start: 2021-12-21 — End: ?

## 2021-12-21 MED ORDER — EPOETIN ALFA-EPBX 40000 UNIT/ML IJ SOLN
40000.0000 [IU] | Freq: Once | INTRAMUSCULAR | Status: AC
  Administered 2021-12-21: 40000 [IU] via SUBCUTANEOUS
  Filled 2021-12-21: qty 1

## 2021-12-21 NOTE — Patient Instructions (Signed)
Epoetin Alfa injection °What is this medication? °EPOETIN ALFA (e POE e tin AL fa) helps your body make more red blood cells. This medicine is used to treat anemia caused by chronic kidney disease, cancer chemotherapy, or HIV-therapy. It may also be used before surgery if you have anemia. °This medicine may be used for other purposes; ask your health care provider or pharmacist if you have questions. °COMMON BRAND NAME(S): Epogen, Procrit, Retacrit °What should I tell my care team before I take this medication? °They need to know if you have any of these conditions: °cancer °heart disease °high blood pressure °history of blood clots °history of stroke °low levels of folate, iron, or vitamin B12 in the blood °seizures °an unusual or allergic reaction to erythropoietin, albumin, benzyl alcohol, hamster proteins, other medicines, foods, dyes, or preservatives °pregnant or trying to get pregnant °breast-feeding °How should I use this medication? °This medicine is for injection into a vein or under the skin. It is usually given by a health care professional in a hospital or clinic setting. °If you get this medicine at home, you will be taught how to prepare and give this medicine. Use exactly as directed. Take your medicine at regular intervals. Do not take your medicine more often than directed. °It is important that you put your used needles and syringes in a special sharps container. Do not put them in a trash can. If you do not have a sharps container, call your pharmacist or healthcare provider to get one. °A special MedGuide will be given to you by the pharmacist with each prescription and refill. Be sure to read this information carefully each time. °Talk to your pediatrician regarding the use of this medicine in children. While this drug may be prescribed for selected conditions, precautions do apply. °Overdosage: If you think you have taken too much of this medicine contact a poison control center or emergency  room at once. °NOTE: This medicine is only for you. Do not share this medicine with others. °What if I miss a dose? °If you miss a dose, take it as soon as you can. If it is almost time for your next dose, take only that dose. Do not take double or extra doses. °What may interact with this medication? °Interactions have not been studied. °This list may not describe all possible interactions. Give your health care provider a list of all the medicines, herbs, non-prescription drugs, or dietary supplements you use. Also tell them if you smoke, drink alcohol, or use illegal drugs. Some items may interact with your medicine. °What should I watch for while using this medication? °Your condition will be monitored carefully while you are receiving this medicine. °You may need blood work done while you are taking this medicine. °This medicine may cause a decrease in vitamin B6. You should make sure that you get enough vitamin B6 while you are taking this medicine. Discuss the foods you eat and the vitamins you take with your health care professional. °What side effects may I notice from receiving this medication? °Side effects that you should report to your doctor or health care professional as soon as possible: °allergic reactions like skin rash, itching or hives, swelling of the face, lips, or tongue °seizures °signs and symptoms of a blood clot such as breathing problems; changes in vision; chest pain; severe, sudden headache; pain, swelling, warmth in the leg; trouble speaking; sudden numbness or weakness of the face, arm or leg °signs and symptoms of a stroke like   changes in vision; confusion; trouble speaking or understanding; severe headaches; sudden numbness or weakness of the face, arm or leg; trouble walking; dizziness; loss of balance or coordination °Side effects that usually do not require medical attention (report to your doctor or health care professional if they continue or are  bothersome): °chills °cough °dizziness °fever °headaches °joint pain °muscle cramps °muscle pain °nausea, vomiting °pain, redness, or irritation at site where injected °This list may not describe all possible side effects. Call your doctor for medical advice about side effects. You may report side effects to FDA at 1-800-FDA-1088. °Where should I keep my medication? °Keep out of the reach of children. °Store in a refrigerator between 2 and 8 degrees C (36 and 46 degrees F). Do not freeze or shake. Throw away any unused portion if using a single-dose vial. Multi-dose vials can be kept in the refrigerator for up to 21 days after the initial dose. Throw away unused medicine. °NOTE: This sheet is a summary. It may not cover all possible information. If you have questions about this medicine, talk to your doctor, pharmacist, or health care provider. °© 2022 Elsevier/Gold Standard (2017-06-19 00:00:00) ° °

## 2021-12-21 NOTE — Telephone Encounter (Signed)
Per 12/21/21 los - called and gave upcoming appoint - confirmed

## 2021-12-21 NOTE — Progress Notes
History      CC: Chronic kidney disease    HPI: Rhonda Fletcher is a 66 y.o. female with past medical history of diabetes mellitus, hypertension.    Known diabetic for the past 25 years without documented retinopathy.   Known hypertensive for the past 10 years.  I first saw her on 07/27/2021.  At that time she id not assess her blood pressure at home.  Denied taking NSAIDS. Denies history of nephrolithiasis.  Denied new rash or joint swelling. Denie chronic diarrhea. She had no changes in her urine.  On 07/27/2021 we addressed hyperkalemia with dietary changes.    07/20/2021 Interval history: Episodes of episodes . Now reports constipation. She had a colonoscopy last year which was normal.  She admits she has not been adherent.  Home BP : SBP peak is around 120.  No changes in urine; denies dysuria.    12/19/2021 interval history: She saw Rhonda Fletcher on 11/04/2021 to discuss renal replacement therapy.  Patient stated that she preferred home options.  She states that she has neighbors in the lower floor of her apartment who make a lot of noise at night and that interrupts her sleep.    Home blood pressure: Stays high, up to 180/79mmHg. She states she watches her salt intake.  Blood glucose at home is in the 150-160's range.  Feet were swollen but that has improved.  Taking clonidine as needed 2-3 times daily.      Past Medical History   Diabetes mellitus  Hypertension    Family History  No known family history of kidney disease    Social History  Retired since 2003.    Medication    HOME MEDS  ? ACCU-CHEK AVIVA PLUS TEST STRP test strip TEST EVERY DAY BEFORE BREAKFAST AS DIRECTED   ? ACCU-CHEK GUIDE GLUCOSE METER kit USE AS DIRECTED TO CHECK BLOOD SUGARS   ? albuterol sulfate (PROAIR HFA) 90 mcg/actuation HFA aerosol inhaler Inhale two puffs by mouth into the lungs every 6 hours as needed.   ? amLODIPine (NORVASC) 10 mg tablet TAKE 1 TABLET EVERY DAY   ? betamethasone valerate (VALISONE) 0.1 % topical cream APPLY TOPICALLY TO AFFECTED AREA DAILY   ? Blood Glucose Control, Normal soln Use in glucose meter, Accu-check Aviva   ? carvediloL (COREG) 25 mg tablet Take one tablet by mouth twice daily with meals. Take with food.   ? cetirizine (ZYRTEC) 10 mg tablet Take one tablet by mouth daily.     ? cholecalciferol(+) (VITAMIN D-3) 2,000 unit tablet Take 1 Tab by mouth daily.   ? cloNIDine HCL (CATAPRES) 0.1 mg tablet Take one tablet by mouth as Needed.   ? cyanocobalamin (VITAMIN B-12) 500 mcg tablet Take one tablet by mouth daily.   ? cyclobenzaprine (FLEXERIL) 5 mg tablet TAKE 1 TABLET AT BEDTIME   ? DROPSAFE ALCOHOL PREP PADS USE TO CLEAN SKIN BEFORE FINGERSTICK   ? famotidine (PEPCID) 20 mg tablet TAKE 1 TABLET TWICE DAILY   ? ferrous sulfate (FEOSOL) 325 mg (65 mg iron) tablet Take one tablet by mouth every 48 hours.   ? fluticasone propionate (FLONASE) 50 mcg/actuation nasal spray, suspension USE 2 SPRAYS IN EACH NOSTRIL AS DIRECTED DAILY. SHAKE BOTTLE GENTLY BEFORE USING   ? glimepiride (AMARYL) 2 mg tablet TAKE 1 TABLET EVERY DAY   ? lancets MISC Use one each as directed daily before breakfast. Diag: E11.29   ? pantoprazole DR (PROTONIX) 40 mg tablet TAKE 1 TABLET EVERY DAY   ?  pioglitazone (ACTOS) 15 mg tablet Take one tablet by mouth daily.          Review of Systems  Constitutional: negative  Eyes: negative  Ears, nose, mouth, throat, and face: negative  Respiratory: negative  Cardiovascular: negative  Gastrointestinal: negative  Genitourinary:negative  Integument/breast: negative  Hematologic/lymphatic: negative  Musculoskeletal:negative  Neurological: negative  Endocrine: negative      Physical Exam        Vitals:    12/21/21 0851 12/21/21 0858   BP: (!) 170/87 (!) 148/74   BP Source: Arm, Left Upper Arm, Left Upper   Pulse: 76 78   PainSc: Zero    Weight: 74.8 kg (165 lb)    Height: 166.4 cm (5' 5.5)      Body mass index is 27.04 kg/m?[name] Kitchen     Repeat BP 159/33mmHg    Gen: Alert and Oriented, No Acute Distress HEENT: Sclera normal; MMM  CV:  S1 and S2 normal, no rubs, murmurs or gallops   Pulm: Clear to Auscultation bilateral   GI: BS+ x4, non-tender to palpation  Neuro: Grossly normal, moving all extremities, speech intact  Ext: no edema, clubbing or cyanosis   Skin: no rash     Assessment and Plan        Rhonda Fletcher is a 66 y.o. female with past medical history of diabetes mellitus and hypertension.      -Chronic kidney disease  The patient has had CKD since at least 2019.  Her baseline serum creatinine has fluctuated between 1.5 to 2 mg/dL.  She had some microalbuminuria on her previous urine test but in April 2022 she did not have significant microalbuminuria.  Her renal ultrasound from August 2022 did not show any gross lesions in the kidney but the signs of the kidney were on the smaller side.  She has metabolic acidosis and anemia.  Episodes of metabolic acidosis have been present since at least 2019 and was out of proportion to her kidney function.  This suggests a prominent tubulointerstitial process.  Her most recent eye exam did not show evidence of retinopathy.  Together, the absence of significant albuminuria, absence of retinopathy on eye exam, small size kidneys makes diabetes nephropathy less likely in this patient.  For the reasons stated above prominent glomerulopathy is also unlikely.  It is possible that previous episodes of acute tubular injury have progressed to CKD.  I do not think that work-up for GN and renal biopsy would be helpful in guiding management of this patient.  Work-up including complements, ANA, electrophoresis unremarkable.    However renal biopsy may prove useful during subsequent work-up for kidney transplant in terms of knowing the etiology and predicting recurrence.      Patient prefers home dialysis; send her to see Rhonda Rhonda Fletcher to discuss renal replacement therapies.    Did not tolerate statins in the past    I placed kidney Transplant referral      --Metabolic acidosis: Patient could not tolerate sodium bicarbonate tablets. She finds it difficult to drink baking soda solution.  I discussed with her to add baking soda to  Her food.  Follow-up BMP from today.    - Hypertension  Blood pressure  Is not well controlled.  Increase carvedilol to 25mg   Twice daily.  Most likely we may have to add an additional agent.  I discussed with the patient to assess BP at home and if after 2 weeks SBP >153mmHg, she will send a message to [name] through  mychart            - Diabetes mellitus  She has difficulties with her diabetes control and her last A1c was 8.3% from 05/27/2021.  Follow up with primary to adjust antidiabetics.    Doran Durand, MD      Patient Instructions   Increase carvedilol to 25mg  po twice daily.  Please monitor your blood pressure 2-3 times a week.  Keep a log of your blood pressure measurements and bring along this log to your clinic appointment.  If after 2 weeks SBP >128mmHg, please send a message to [name] through mychart.  Keep to a low-salt diet.  Please take the blood pressure medications as prescribed .     The following are some measures which may help to slow down the progression of kidney disease:    Avoid medications known as  NSAIDs which  include Advil, Motrin,Aleve, ibuprofen, naproxen.    Maintain adequate fluid intake.    Acute illnesses like the flu and COVID which may end up in hospitalization may impact on  kidney function and may cause the kidneys to fail.    It is important to keep a up with your regular vaccinations for these diseases and for the pneumonia vaccine as well.    If you develop diarrhea or vomiting and unable to keep up with your fluid intake, please report to the nearest ED since it may affect blood pressure and affect the blood flow to the kidney    Some medications used in patients with kidney disease including diuretics [water pill], some blood pressure medications [ACE inhibitors and ARB] like lisinopril, losartan may need to be held if you develop severe diarrhea or vomiting.  Under these conditions these medications may worsen kidney function.    Visit the following web site for information on diet in patients with kidney disease:  [email or web address].org/nutrition

## 2021-12-21 NOTE — Progress Notes (Addendum)
Hematology and Oncology Follow Up Visit  [name] [name] [id number] Dec 20, 1955 66 y.o. 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 Unfortunately, she was diagnosed with Covid pneumonia and CHF but left ED AMA instead of being admitted to ICU.  She continues to feel fatigued, weak and SOB with exertion. She is on 3L supplemental O2 at home and is waiting on approval with home health for a portable O2 tank.  No fever, chills, n/v, cough, rash, dizziness, chest pain, palpitations, abdominal pain or changes in bowel or bladder habits.  She does note bright red blood at times in her stool with straining and constipation. No other blood loss noted.  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  She has chronic swelling in both lower extremities, more prominent in left lower extremity.  She has wrapped dressings on both feet and ankles and states that her husband changes at home. She also follows up with an out patient wound care clinic as well. She states that her wounds seem to be improved.  No falls or syncope to report.  She states that her appetite comes and goes. She is doing her best to stay well hydrated. Unable to stand for weight. Patient declined.   ECOG Performance Status: 2 - Symptomatic, <50% confined to bed  Medications:  Allergies as of 12/21/2021       Reactions   Bee Pollen Anaphylaxis   Fish-derived Products Hives, Shortness Of Breath, Swelling, Rash   Hives get in throat causing trouble breathing   Mushroom Extract Complex Anaphylaxis   Penicillins Anaphylaxis   **Tolerated cefepime March 2021 Did it  involve swelling of the face/tongue/throat, SOB, or low BP? Yes Did it involve sudden or severe rash/hives, skin peeling, or any reaction on the inside of your mouth or nose? No Did you need to seek medical attention at a hospital or doctor's office? Yes When did it last happen?  A few months ago If all above answers are "NO", may proceed with cephalosporin use.   Rosemary Oil Anaphylaxis   Shellfish Allergy Hives, Shortness Of Breath, Swelling, Rash   Tomato Hives, Shortness Of Breath   Hives in throat causes her trouble breathing   Acetaminophen Other (See Comments)   GI upset   Aloe Vera Hives   Broccoli [brassica Oleracea] Hives   Acyclovir And Related Other (See Comments)   Unknown reaction   Naproxen Other (See Comments)   Unknown reaction        Medication List        Accurate as of December 21, 2021 10:53 AM. If you have any questions, ask your nurse or doctor.          acetaminophen 500 MG tablet Commonly known as: TYLENOL Take 1,000 mg by mouth every 6 (six) hours as needed for headache.   apixaban 5 MG Tabs tablet Commonly known as: ELIQUIS Take 1 tablet (5 mg total) by mouth 2 (two) times daily.   atorvastatin 10 MG tablet Commonly known as: LIPITOR Take 1 tablet by mouth once daily   carvedilol 6.25 MG tablet Commonly known as:  COREG Take 1 tablet (6.25 mg total) by mouth 2 (two) times daily with a meal.   cefUROXime 250 MG tablet Commonly known as: CEFTIN Take 1 tablet (250 mg total) by mouth 2 (two) times daily with a meal.   diazepam 5 MG tablet Commonly known as: VALIUM Take 1 tablet (5 mg total) by mouth every 12 (twelve) hours as needed for anxiety.   DULoxetine 60 MG capsule Commonly known as: CYMBALTA Take 1 capsule by mouth once daily   ferrous sulfate 325 (65 FE) MG tablet Take 1 tablet (325 mg total) by mouth 2 (two) times daily with a meal.   folic acid 1 MG tablet Commonly known as: FOLVITE Take 2 tablets by mouth once daily    FreeStyle Libre 14 Day Reader Kerrin Mo 1 Device by Does not apply route daily.   FreeStyle Libre 14 Day Sensor Misc 1 Device by Does not apply route daily.   furosemide 20 MG tablet Commonly known as: LASIX Take 1 tablet (20 mg total) by mouth daily. What changed: how much to take   HYDROcodone-acetaminophen 5-325 MG tablet Commonly known as: NORCO/VICODIN Take 1 tablet by mouth every 6 (six) hours as needed for moderate pain.   Lantus SoloStar 100 UNIT/ML Solostar Pen Generic drug: insulin glargine INJECT 20 UNITS SUBCUTANEOUSLY IN THE MORNING AND 40 AT NIGHT What changed:  how much to take when to take this additional instructions   metroNIDAZOLE 500 MG tablet Commonly known as: Flagyl Take 1 tablet (500 mg total) by mouth 3 (three) times daily.   montelukast 10 MG tablet Commonly known as: SINGULAIR TAKE 1 TABLET BY MOUTH AT BEDTIME   multivitamin with minerals Tabs tablet Take 1 tablet by mouth daily.   NONFORMULARY OR COMPOUNDED ITEM Please dispense 1 L border of normal saline, ABD pads, Kerlix roll, 30 gauze pieces, 1 roll of paper tape for twice a day following dressing change, gently clean lower extremity wounds with Normal Saline, pat dry. Cover with dry dressings (ABD pads) and secure with Kerlix roll gauze/paper tape   NONFORMULARY OR COMPOUNDED ITEM Power wheelchair  #1  as directed   ondansetron 8 MG tablet Commonly known as: Zofran Take 0.5 tablets (4 mg total) by mouth every 8 (eight) hours as needed for nausea or vomiting.   pregabalin 75 MG capsule Commonly known as: LYRICA TAKE 1 CAPSULE BY MOUTH THREE TIMES DAILY What changed: when to take this   proparacaine 0.5 % ophthalmic solution Commonly known as: ALCAINE   VITAMIN D PO Take 2 capsules by mouth daily.        Allergies:  Allergies  Allergen Reactions   Bee Pollen Anaphylaxis   Fish-Derived Products Hives, Shortness Of Breath, Swelling and Rash    Hives get in throat causing trouble  breathing   Mushroom Extract Complex Anaphylaxis   Penicillins Anaphylaxis    **Tolerated cefepime March 2021 Did it involve swelling of the face/tongue/throat, SOB, or low BP? Yes Did it involve sudden or severe rash/hives, skin peeling, or any reaction on the inside of your mouth or nose? No Did you need to seek medical attention at a hospital or doctor's office? Yes When did it last happen?  A few months ago If all above answers are "NO", may proceed with cephalosporin use.   Rosemary Oil Anaphylaxis   Shellfish Allergy Hives, Shortness Of Breath, Swelling and Rash   Tomato Hives and Shortness Of Breath    Hives in throat causes her trouble breathing  Acetaminophen Other (See Comments)    GI upset   Aloe Vera Hives   Broccoli [Brassica Oleracea] Hives   Acyclovir And Related Other (See Comments)    Unknown reaction   Naproxen Other (See Comments)    Unknown reaction    Past Medical History, Surgical history, Social history, and Family History were reviewed and updated.  Review of Systems: All other 10 point review of systems is negative.   Physical Exam:  oral temperature is 97.7 F (36.5 C). Her blood pressure is 161/78 (abnormal) and her pulse is 86. Her respiration is 17 and oxygen saturation is 99%.   Wt Readings from Last 3 Encounters:  12/19/21 206 lb 11.2 oz (93.8 kg)  12/01/21 206 lb 12.7 oz (93.8 kg)  11/29/21 206 lb 11.2 oz (93.8 kg)    Ocular: Sclerae unicteric, pupils equal, round and reactive to light Ear-nose-throat: Oropharynx clear, dentition fair Lymphatic: No cervical or supraclavicular adenopathy Lungs no rales or rhonchi, good excursion bilaterally Heart regular rate and rhythm, no murmur appreciated Abd soft, nontender, positive bowel sounds MSK no focal spinal tenderness, no joint edema Neuro: non-focal, well-oriented, appropriate affect Breasts: Deferred   Lab Results  Component Value Date   WBC 5.0 12/21/2021   HGB 7.5 (L) 12/21/2021    HCT 24.7 (L) 12/21/2021   MCV 92.5 12/21/2021   PLT 191 12/21/2021   Lab Results  Component Value Date   FERRITIN 586.5 (H) 12/19/2021   IRON 23 (L) 11/30/2021   TIBC 162 (L) 11/30/2021   UIBC 139 (L) 11/30/2021   IRONPCTSAT 14 11/30/2021   Lab Results  Component Value Date   RETICCTPCT 1.5 11/30/2021   RBC 2.67 (L) 12/21/2021   No results found for: KPAFRELGTCHN, LAMBDASER, KAPLAMBRATIO No results found for: IGGSERUM, IGA, IGMSERUM No results found for: Odetta Pink, SPEI   Chemistry      Component Value Date/Time   NA 139 12/19/2021 1630   K 4.2 12/19/2021 1630   CL 108 12/19/2021 1630   CO2 26 12/19/2021 1630   BUN 26 (H) 12/19/2021 1630   CREATININE 2.75 (H) 12/19/2021 1630   CREATININE 3.58 (HH) 11/30/2021 1345   CREATININE 1.97 (H) 06/24/2020 1431      Component Value Date/Time   CALCIUM 8.4 12/19/2021 1630   ALKPHOS 79 12/19/2021 1630   AST 10 12/19/2021 1630   AST 21 11/30/2021 1345   ALT 4 12/19/2021 1630   ALT 7 11/30/2021 1345   BILITOT 0.4 12/19/2021 1630   BILITOT 0.5 11/30/2021 1345       Impression and Plan:  Ms. Hesketh is a very pleasant 66 yo caucasian female with long standing history of iron deficiency anemia as well as history of DVT's in both the right and left peroneal and posterior tibial veins while she was bed bound in the hospital being treated for osteomyelitis.  She had an elevated homocystine level, mildly elevated anticardiolipin antibody IgG level and elevated D-dimer at that time as well as on repeat lab work.  She continues to recuperate from her bout with covid and pneumonia.  She states that she is doing well on 3L O2 supplemental O2.  ESA given, Hgb 7.5.  We will hold off on transfusing her for now.  Iron studies are pending.  Lab and injection in 3 weeks. Follow-up in 6 weeks.  She can conatct our office with any questions or concerns. We can certainly see her sooner if  needed.  Lottie Dawson, NP 2/22/202310:53 AM

## 2021-12-23 ENCOUNTER — Telehealth: Payer: PRIVATE HEALTH INSURANCE

## 2021-12-26 ENCOUNTER — Ambulatory Visit: Payer: PRIVATE HEALTH INSURANCE | Admitting: Family

## 2021-12-26 ENCOUNTER — Other Ambulatory Visit: Payer: PRIVATE HEALTH INSURANCE

## 2021-12-26 ENCOUNTER — Ambulatory Visit: Payer: PRIVATE HEALTH INSURANCE

## 2021-12-28 ENCOUNTER — Ambulatory Visit (INDEPENDENT_AMBULATORY_CARE_PROVIDER_SITE_OTHER): Payer: PRIVATE HEALTH INSURANCE | Admitting: *Deleted

## 2021-12-28 DIAGNOSIS — N189 Chronic kidney disease, unspecified: Secondary | ICD-10-CM

## 2021-12-28 DIAGNOSIS — E785 Hyperlipidemia, unspecified: Secondary | ICD-10-CM

## 2021-12-28 DIAGNOSIS — E1165 Type 2 diabetes mellitus with hyperglycemia: Secondary | ICD-10-CM

## 2021-12-28 DIAGNOSIS — L03115 Cellulitis of right lower limb: Secondary | ICD-10-CM

## 2021-12-28 DIAGNOSIS — Z794 Long term (current) use of insulin: Secondary | ICD-10-CM

## 2021-12-28 DIAGNOSIS — N1832 Chronic kidney disease, stage 3b: Secondary | ICD-10-CM

## 2021-12-28 DIAGNOSIS — I10 Essential (primary) hypertension: Secondary | ICD-10-CM

## 2021-12-28 DIAGNOSIS — E669 Obesity, unspecified: Secondary | ICD-10-CM

## 2021-12-28 DIAGNOSIS — F418 Other specified anxiety disorders: Secondary | ICD-10-CM

## 2021-12-28 DIAGNOSIS — N179 Acute kidney failure, unspecified: Secondary | ICD-10-CM

## 2021-12-28 DIAGNOSIS — K7030 Alcoholic cirrhosis of liver without ascites: Secondary | ICD-10-CM

## 2021-12-28 DIAGNOSIS — L03116 Cellulitis of left lower limb: Secondary | ICD-10-CM

## 2021-12-28 DIAGNOSIS — I252 Old myocardial infarction: Secondary | ICD-10-CM

## 2021-12-28 DIAGNOSIS — E1142 Type 2 diabetes mellitus with diabetic polyneuropathy: Secondary | ICD-10-CM

## 2021-12-30 NOTE — Patient Instructions (Signed)
Visit Information   Thank you for taking time to visit with me today. Please don't hesitate to contact me if I can be of assistance to you before our next scheduled telephone appointment.  Following are the goals we discussed today:  Patient Goals/Self-Care Activities: Begin personal counseling with LCSW, on a bi-weekly basis, to reduce and manage symptoms of Anxiety, Depression, and Post-Traumatic Stress Disorder, until well-established with a community mental health provider, for ongoing counseling and supportive services.   Review list of female therapists and psychiatrists in [facility] that accept Medicare, and begin contacting agencies and mental health providers of interest. Incorporate into daily practice - relaxation techniques, deep breathing exercises and mindfulness meditation strategies. Continue with compliance of taking prescription medications, obtain adequate rest, stay well-hydrated, and eat a healthy, well-balanced diet. Review Aid and Attendance Benefits Information and Application, notifying LCSW if you need assistance with application completion and/or submission, to [employer]. Collaboration with Primary Care Provider, Dr. [name] to check the status of your orders for a motorized wheelchair, and Portable Helios Oxygen Concentrator and Tank. Please accept all calls from [employer] of [facility], in an effort to establish prepared meal delivery services through [employer] Whe[employer].    Contact LCSW directly (# [phone]), if you have questions, need assistance, or if additional social work needs are identified between now and our next scheduled telephone outreach call. Follow-Up Date:  01/10/2022 at 2:30 pm  Please call the care guide team at [phone] if you need to cancel or reschedule your appointment.   If you are experiencing a Mental Health or [place] or need someone to talk to, please call the Suicide and Crisis  Lifeline: 988 call the [place] National Suicide Prevention Lifeline: (414)307-7492 or TTY: 713-383-6940 TTY 937-312-1906) to talk to a trained counselor call 1-800-273-TALK (toll free, 24 hour hotline) go to [facility] ([phone]) call the [facility]: [phone] call 911   Following is a copy of your full care plan:  Care Plan : LCSW Plan of Care  Updates made by Francis Gaines, LCSW since 12/30/2021 12:00 AM     Problem: Find Help in My Community.   Priority: High     Goal: Find Help in My Community.   Start Date: 12/28/2021  Expected End Date: 03/30/2022  This Visit's Progress: On track  Priority: High  Note:   Current Barriers:   Patient with Acute Mental Health Needs related to Anxiety, Depression, and Post-Traumatic Stress Disorder, requires Support, Education, Resources, Referrals, Advocacy, and Care Coordination, in order to meet Unmet Acute Mental Health Needs. Patient with Stage III Chronic Kidney Disease, Fibromyalgia, and Type II Diabetes Mellitus, needs Support, Education, and Care Coordination, to resolve Unmet Personal Care Needs in the Home. Patient lacks knowledge of available community agencies and resources. Patient is unable to consistently perform ADL's/IADL's independently. Clinical Goal(s):  Patient will work with LCSW, to reduce and manage symptoms of Anxiety, Depression, and Post-Traumatic Stress Disorder, until well-established with a community mental health provider.  Patient will work with LCSW, in an effort to secure Aid and Therapist, sports, through [employer].    Patient will work with [employer], to obtain prepared meal delivery services through [employer] Whe[employer], with [employer] of [place].  Patient will increase knowledge and/or ability of:        Coping Skills, Healthy Habits, Self-Management Skills, Stress Reduction, Home Safety and Utilizing  [employer]  Agencies and Resources.   Patient will attend all scheduled medical appointments, as evidenced by patient report and care team review of appointment completion in electronic medical record. Patient will demonstrate improved health management independence, as evidenced by having in-home care services in place. Interventions:  Assessed patient's previous treatment, needs, coping skills, current treatment, support system, and barriers to care. Collaboration with Primary Care Provider, Dr. [name] regarding development and update of comprehensive plan of care, as evidenced by provider attestation and co-signature. Inter-disciplinary care team collaboration (see longitudinal plan of care). Clinical Interventions: PHQ-2 and PHQ-9 Depression Screening Tool performed and results reviewed with patient. Suicidal Ideation/Homicidal Ideation Assessed - none present. Solution-Focused Therapy Performed. Mindfulness Meditation Strategies, Relaxation Techniques, and Deep Breathing Exercises Taught, and Encouraged Daily. Active Listening/Reflection Utilized. Verbalization of Feelings Encouraged, and Emotional Support Provided. Problem Solving/Task-Centered Solutions Developed. Brief Cognitive Behavioral Therapy Initiated. Mental Health Medications Reviewed, and Compliance Emphasized.   Quality of Sleep Assessed, and Sleep Hygiene Techniques Promoted.      Discussed plans with patient for ongoing care management follow-up, and provided patient with direct contact information for care management team. Discussed several options for long-term counseling based on need and insurance through [employer]. Assessed needs, level of care concerns, basic eligibility, and provided education on Aid and Attendance Benefits Application process, as well as process for applying for Meals on Wheels, through [employer] of [place].   Identified resources and durable medical  equipment needed in the home to improve safety and promote independence. Referral placed to [employer] of [facility], Meals [employer]. Patient Goals/Self-Care Activities: Begin personal counseling with LCSW, on a bi-weekly basis, to reduce and manage symptoms of Anxiety, Depression, and Post-Traumatic Stress Disorder, until well-established with a community mental health provider, for ongoing counseling and supportive services.   Review list of female therapists and psychiatrists in [facility] that accept Medicare, and begin contacting agencies and mental health providers of interest. Incorporate into daily practice - relaxation techniques, deep breathing exercises and mindfulness meditation strategies. Continue with compliance of taking prescription medications, obtain adequate rest, stay well-hydrated, and eat a healthy, well-balanced diet. Review Aid and Attendance Benefits Information and Application, notifying LCSW if you need assistance with application completion and/or submission, to [employer]. Collaboration with Primary Care Provider, Dr. [name] to check the status of your orders for a motorized wheelchair, and Portable Helios Oxygen Concentrator and Tank. Please accept all calls from [employer] of [facility], in an effort to establish prepared meal delivery services through Me[employer] Whe[employer].    Contact LCSW directly (# [phone]), if you have questions, need assistance, or if additional social work needs are identified between now and our next scheduled telephone outreach call. Follow-Up Date:  01/10/2022 at 2:30 pm      Consent to CCM Services: Ms. Torosian was given information about Chronic Care Management services including:  CCM service includes personalized support from designated clinical staff supervised by her physician, including individualized plan of care and coordination with other care providers 24/7 contact phone  numbers for assistance for urgent and routine care needs. Service will only be billed when office clinical staff spend 20 minutes or more in a month to coordinate care. Only one practitioner may furnish and bill the service in a calendar month. The patient may stop CCM services at any time (effective at the end of the month) by phone call to the office staff. The patient will be responsible for  cost sharing (co-pay) of up to 20% of the service fee (after annual deductible is met).  Patient agreed to services and verbal consent obtained.   Patient verbalizes understanding of instructions and care plan provided today and agrees to view in [place]. Active MyChart status confirmed with patient.    Telephone follow up appointment with care management team member scheduled for:  01/10/2022 at 2:30 pm.  Nat Christen [place] Licensed Clinical Social Worker [place] [phone]

## 2021-12-30 NOTE — Chronic Care Management (AMB) (Signed)
Chronic Care Management    Clinical Social Work Note  12/30/2021 Name: [name] [name] MRN: [id number] DOB: February 13, 1956  [name] [name] is a 66 y.o. year old adult who is a primary care patient of Ann Held, DO. The CCM team was consulted to assist the patient with chronic disease management and/or care coordination needs related to: [employer], Food Insecurity, Level of Care Concerns, Mental Health Counseling and Resources, and Caregiver Stress.   Engaged with patient by telephone for initial visit in response to provider referral for social work chronic care management and care coordination services.   Consent to Services:  The patient was given information about Chronic Care Management services, agreed to services, and gave verbal consent prior to initiation of services.  Please see initial visit note for detailed documentation.   Patient agreed to services and consent obtained.   Assessment: Review of patient past medical history, allergies, medications, and health status, including review of relevant consultants reports was performed today as part of a comprehensive evaluation and provision of chronic care management and care coordination services.     SDOH (Social Determinants of Health) assessments and interventions performed:  SDOH Interventions    Flowsheet Row Most Recent Value  SDOH Interventions   Food Insecurity Interventions Intervention Not Indicated  Financial Strain Interventions Intervention Not Indicated  Housing Interventions Intervention Not Indicated  Intimate Partner Violence Interventions Intervention Not Indicated  Physical Activity Interventions Patient Refused  Stress Interventions Offered [employer] Resources, Provide Counseling, Other (Comment)  [Provide Counseling Resources]  Social Connections Interventions Intervention Not Indicated  Transportation Interventions Intervention Not Indicated  Depression Interventions/Treatment   Referral to Psychiatry, Medication, Counseling        Advanced Directives Status: See Care Plan for related entries.  CCM Care Plan  Allergies  Allergen Reactions   Bee Pollen Anaphylaxis   Fish-Derived Products Hives, Shortness Of Breath, Swelling and Rash    Hives get in throat causing trouble breathing   Mushroom Extract Complex Anaphylaxis   Penicillins Anaphylaxis    **Tolerated cefepime March 2021 Did it involve swelling of the face/tongue/throat, SOB, or low BP? Yes Did it involve sudden or severe rash/hives, skin peeling, or any reaction on the inside of your mouth or nose? No Did you need to seek medical attention at a hospital or doctor's office? Yes When did it last happen?  A few months ago If all above answers are "NO", may proceed with cephalosporin use.   Rosemary Oil Anaphylaxis   Shellfish Allergy Hives, Shortness Of Breath, Swelling and Rash   Tomato Hives and Shortness Of Breath    Hives in throat causes her trouble breathing   Acetaminophen Other (See Comments)    GI upset   Aloe Vera Hives   Broccoli [Brassica Oleracea] Hives   Acyclovir And Related Other (See Comments)    Unknown reaction   Naproxen Other (See Comments)    Unknown reaction    Outpatient Encounter Medications as of 12/28/2021  Medication Sig Note   acetaminophen (TYLENOL) 500 MG tablet Take 1,000 mg by mouth every 6 (six) hours as needed for headache.    apixaban (ELIQUIS) 5 MG TABS tablet Take 1 tablet (5 mg total) by mouth 2 (two) times daily.    atorvastatin (LIPITOR) 10 MG tablet Take 1 tablet by mouth once daily    carvedilol (COREG) 6.25 MG tablet Take 1 tablet (6.25 mg total) by mouth 2 (two) times daily with a meal.    cefUROXime (  CEFTIN) 250 MG tablet Take 1 tablet (250 mg total) by mouth 2 (two) times daily with a meal.    Continuous Blood Gluc Receiver (FREESTYLE LIBRE 14 DAY READER) DEVI 1 Device by Does not apply route daily.    Continuous Blood Gluc Sensor (FREESTYLE LIBRE  14 DAY SENSOR) MISC 1 Device by Does not apply route daily.    diazepam (VALIUM) 5 MG tablet Take 1 tablet (5 mg total) by mouth every 12 (twelve) hours as needed for anxiety.    DULoxetine (CYMBALTA) 60 MG capsule Take 1 capsule by mouth once daily (Patient taking differently: Take 60 mg by mouth daily.)    ferrous sulfate 325 (65 FE) MG tablet Take 1 tablet (325 mg total) by mouth 2 (two) times daily with a meal.    folic acid (FOLVITE) 1 MG tablet Take 2 tablets by mouth once daily    furosemide (LASIX) 20 MG tablet Take 1 tablet (20 mg total) by mouth daily. (Patient taking differently: Take 40 mg by mouth daily.)    HYDROcodone-acetaminophen (NORCO/VICODIN) 5-325 MG tablet Take 1 tablet by mouth every 6 (six) hours as needed for moderate pain.    insulin glargine (LANTUS SOLOSTAR) 100 UNIT/ML Solostar Pen INJECT 20 UNITS SUBCUTANEOUSLY IN THE MORNING AND 40 AT NIGHT (Patient taking differently: 15-18 Units at bedtime.)    metroNIDAZOLE (FLAGYL) 500 MG tablet Take 1 tablet (500 mg total) by mouth 3 (three) times daily.    montelukast (SINGULAIR) 10 MG tablet TAKE 1 TABLET BY MOUTH AT BEDTIME (Patient taking differently: Take 10 mg by mouth at bedtime.)    Multiple Vitamin (MULTIVITAMIN WITH MINERALS) TABS tablet Take 1 tablet by mouth daily.    NONFORMULARY OR COMPOUNDED ITEM Please dispense 1 L border of normal saline, ABD pads, Kerlix roll, 30 gauze pieces, 1 roll of paper tape for twice a day following dressing change, gently clean lower extremity wounds with Normal Saline, pat dry. Cover with dry dressings (ABD pads) and secure with Kerlix roll gauze/paper tape    NONFORMULARY OR COMPOUNDED ITEM Power wheelchair  #1  as directed    ondansetron (ZOFRAN) 8 MG tablet Take 0.5 tablets (4 mg total) by mouth every 8 (eight) hours as needed for nausea or vomiting.    pregabalin (LYRICA) 75 MG capsule TAKE 1 CAPSULE BY MOUTH THREE TIMES DAILY (Patient taking differently: Take 75 mg by mouth 2 (two)  times daily.) 10/17/2021: Prescription is TID, pt prefers BID bc it causes itching if taken more   proparacaine (ALCAINE) 0.5 % ophthalmic solution     VITAMIN D PO Take 2 capsules by mouth daily.    No facility-administered encounter medications on file as of 12/28/2021.    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   Cellulitis of right foot 10/16/2021   Hyponatremia 10/16/2021   Hyperkalemia 10/16/2021   Acute kidney injury superimposed on CKD ([place]) 10/16/2021   Leukocytosis 10/16/2021   Diabetic foot ulcers ([place]) 10/16/2021   DM (diabetes mellitus), type 2 with renal complications ([place]) 10/62/6948   Generalized weakness 10/16/2021   Portal hypertension ([place]) 08/16/2021   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[place]) 08/16/2021   Eczema of scalp 06/27/2021   Hypothyroidism 06/27/2021   Pruritus 06/27/2021   Seasonal allergic rhinitis due to pollen 02/02/2021   CRF (chronic renal failure), stage 4 (  severe) ([place]) 02/02/2021   PTSD (post-traumatic stress disorder)    Osteoarthritis    MRSA (methicillin resistant Staphylococcus aureus)    Hypertension    High cholesterol    Hepatic steatosis    GERD (gastroesophageal reflux disease)    Fibromyalgia    Fatty liver    Exertional shortness of breath    Episode of visual loss of left eye    Depression    Dehiscence of closure of skin    Constipation    Chronic kidney disease    Cataract    Bulging lumbar disc    Asthma    Anxiety    Anginal pain ([place])    Anemia    Tinea capitis 10/11/2020   Tinea corporis 10/11/2020   SOB (shortness of breath) 10/11/2020   Hip pain 05/16/2020   IDA (iron deficiency anemia) 05/04/2020   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 ([place]) 03/16/2020   S/P lumbar fusion 03/04/2020   Spinal stenosis of lumbar region  01/22/2020   Osteomyelitis of vertebra of thoracolumbar region ([place]) 01/21/2020   Compression fracture of L1 lumbar vertebra ([place]) 11/17/2019   Fracture of multiple ribs 11/17/2019   Diarrhea 09/04/2019   Pressure injury of skin 04/17/2019   Septic arthritis of elbow, right ([place]) 04/17/2019   Retinopathy 04/17/2019   Renal transplant, 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   Sleep apnea 11/16/2017   Diabetic foot infection ([place]) 04/06/2017   Type 2 diabetes mellitus with hyperglycemia, with long-term current use of insulin ([place]) 04/06/2017   Neuropathy 05/05/2015   Renal insufficiency 05/05/2015   Diabetic peripheral neuropathy ([place]) 01/12/2015   CKD (chronic kidney disease), stage III ([place]) 05/27/2014   History of MI (myocardial infarction) 10/06/2013   Nausea and vomiting 10/06/2013   Obesity (BMI 30-39.9) 09/20/2013   Multinodular goiter 04/17/2013   Normocytic anemia 02/03/2013   Proximal humerus fracture 10/15/2012   CAD (coronary artery disease) 01/11/2012   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  Limited Access to Food, Level of Care Concerns, ADL/IADL Limitations, Mental Health Concerns, Social Isolation, Limited Access to Caregiver, and Lacks Knowledge of [employer].  Care Plan : LCSW Plan of Care  Updates made by Francis Gaines, LCSW since 12/30/2021 12:00 AM     Problem: Find Help in My Community.   Priority: High     Goal: Find Help in My Community.   Start Date: 12/28/2021  Expected End Date: 03/30/2022  This Visit's Progress: On track  Priority: High  Note:   Current Barriers:   Patient with Acute Mental Health Needs related to Anxiety, Depression, and Post-Traumatic Stress Disorder, requires Support, Education, Resources, Referrals, Advocacy, and Care Coordination, in order to meet Unmet  Acute Mental Health Needs. Patient with Stage III Chronic Kidney Disease, Fibromyalgia, and Type II Diabetes Mellitus, needs Support, Education, and Care Coordination, to resolve Unmet Personal Care Needs in the Home. Patient lacks knowledge of available community agencies and resources. Patient is unable to consistently perform ADL's/IADL's independently. Clinical Goal(s):  Patient will work with LCSW, to reduce and manage symptoms of Anxiety, Depression, and Post-Traumatic Stress Disorder, until well-established with a community mental health provider.  Patient will work with LCSW, in an effort to secure Aid and Therapist, sports, through [employer].    Patient will work with LCSW, to obtain prepared meal delivery  services through [employer], with [employer] of [place].  Patient will increase knowledge and/or ability of:        Coping Skills, Healthy Habits, Self-Management Skills, Stress Reduction, Home Safety and Utilizing [employer] and Resources.   Patient will attend all scheduled medical appointments, as evidenced by patient report and care team review of appointment completion in electronic medical record. Patient will demonstrate improved health management independence, as evidenced by having in-home care services in place. Interventions:  Assessed patient's previous treatment, needs, coping skills, current treatment, support system, and barriers to care. Collaboration with Primary Care Provider, Dr. [name] regarding development and update of comprehensive plan of care, as evidenced by provider attestation and co-signature. Inter-disciplinary care team collaboration (see longitudinal plan of care). Clinical Interventions: PHQ-2 and PHQ-9 Depression Screening Tool performed and results reviewed with patient. Suicidal Ideation/Homicidal Ideation Assessed - none present. Solution-Focused Therapy Performed. Mindfulness Meditation  Strategies, Relaxation Techniques, and Deep Breathing Exercises Taught, and Encouraged Daily. Active Listening/Reflection Utilized. Verbalization of Feelings Encouraged, and Emotional Support Provided. Problem Solving/Task-Centered Solutions Developed. Brief Cognitive Behavioral Therapy Initiated. Mental Health Medications Reviewed, and Compliance Emphasized.   Quality of Sleep Assessed, and Sleep Hygiene Techniques Promoted.      Discussed plans with patient for ongoing care management follow-up, and provided patient with direct contact information for care management team. Discussed several options for long-term counseling based on need and insurance through [employer]. Assessed needs, level of care concerns, basic eligibility, and provided education on Aid and Attendance Benefits Application process, as well as process for applying for Meals on Wheels, through [employer] of [place].   Identified resources and durable medical equipment needed in the home to improve safety and promote independence. Referral placed to [employer] of [facility], Meals on [employer]. Patient Goals/Self-Care Activities: Begin personal counseling with LCSW, on a bi-weekly basis, to reduce and manage symptoms of Anxiety, Depression, and Post-Traumatic Stress Disorder, until well-established with a community mental health provider, for ongoing counseling and supportive services.   Review list of female therapists and psychiatrists in [facility] that accept Medicare, and begin contacting agencies and mental health providers of interest. Incorporate into daily practice - relaxation techniques, deep breathing exercises and mindfulness meditation strategies. Continue with compliance of taking prescription medications, obtain adequate rest, stay well-hydrated, and eat a healthy, well-balanced diet. Review Aid and Attendance Benefits Information and Application, notifying LCSW  if you need assistance with application completion and/or submission, to [employer]. Collaboration with Primary Care Provider, Dr. [name] to check the status of your orders for a motorized wheelchair, and Portable [employer] Oxygen Concentrator and Tank. Please accept all calls from [employer] of [facility], in an effort to establish prepared meal delivery services through [employer].    Contact LCSW directly (# [phone]), if you have questions, need assistance, or if additional social work needs are identified between now and our next scheduled telephone outreach call. Follow-Up Date:  01/10/2022 at 2:30 pm    Nat Christen [place] Clinical Social Worker [place] [phone]

## 2022-01-02 ENCOUNTER — Encounter: Payer: Self-pay | Admitting: *Deleted

## 2022-01-03 ENCOUNTER — Other Ambulatory Visit: Payer: Self-pay

## 2022-01-03 ENCOUNTER — Encounter (HOSPITAL_BASED_OUTPATIENT_CLINIC_OR_DEPARTMENT_OTHER): Payer: PRIVATE HEALTH INSURANCE | Attending: Internal Medicine | Admitting: General Surgery

## 2022-01-03 DIAGNOSIS — E1169 Type 2 diabetes mellitus with other specified complication: Secondary | ICD-10-CM | POA: Diagnosis not present

## 2022-01-03 DIAGNOSIS — Z89421 Acquired absence of other right toe(s): Secondary | ICD-10-CM | POA: Insufficient documentation

## 2022-01-03 DIAGNOSIS — M86672 Other chronic osteomyelitis, left ankle and foot: Secondary | ICD-10-CM | POA: Diagnosis not present

## 2022-01-03 DIAGNOSIS — E1122 Type 2 diabetes mellitus with diabetic chronic kidney disease: Secondary | ICD-10-CM | POA: Insufficient documentation

## 2022-01-03 DIAGNOSIS — E1142 Type 2 diabetes mellitus with diabetic polyneuropathy: Secondary | ICD-10-CM | POA: Insufficient documentation

## 2022-01-03 DIAGNOSIS — I509 Heart failure, unspecified: Secondary | ICD-10-CM | POA: Diagnosis not present

## 2022-01-03 DIAGNOSIS — L97524 Non-pressure chronic ulcer of other part of left foot with necrosis of bone: Secondary | ICD-10-CM | POA: Diagnosis not present

## 2022-01-03 DIAGNOSIS — E11621 Type 2 diabetes mellitus with foot ulcer: Secondary | ICD-10-CM | POA: Diagnosis present

## 2022-01-03 DIAGNOSIS — L97514 Non-pressure chronic ulcer of other part of right foot with necrosis of bone: Secondary | ICD-10-CM | POA: Insufficient documentation

## 2022-01-03 DIAGNOSIS — M86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, right ankle and foot: Secondary | ICD-10-CM | POA: Insufficient documentation

## 2022-01-03 DIAGNOSIS — M797 Fibromyalgia: Secondary | ICD-10-CM | POA: Diagnosis not present

## 2022-01-03 DIAGNOSIS — N184 Chronic kidney disease, stage 4 (severe): Secondary | ICD-10-CM | POA: Diagnosis not present

## 2022-01-03 DIAGNOSIS — I130 Hypertensive heart and chronic kidney disease with heart failure and stage 1 through stage 4 chronic kidney disease, or unspecified chronic kidney disease: Secondary | ICD-10-CM | POA: Diagnosis not present

## 2022-01-03 NOTE — Progress Notes (Signed)
RIELY, OETKEN ([id number]) Visit Report for 01/03/2022 Arrival Information Details Patient Name: Date of Service: [name], [name] 01/03/2022 3:45 PM Medical Record Number: [id number] Patient Account Number: [id number] Date of Birth/Sex: Treating RN: 1956/05/31 (66 y.o. [name] Primary Care Michaiah Holsopple: [name] Other Clinician: Referring Davonne Baby: Treating [name]/Extender: Maxwell Marion in Treatment: 5 Visit Information History Since Last Visit Added or deleted any medications: No Patient Arrived: Wheel Chair Any new allergies or adverse reactions: No Arrival Time: 16:18 Had a fall or experienced change in No Accompanied By: self activities of daily living that may affect Transfer Assistance: None risk of falls: Patient Identification Verified: Yes Signs or symptoms of abuse/neglect since last visito No Secondary Verification Process Completed: Yes Hospitalized since last visit: No Patient Requires Transmission-Based Precautions: No Implantable device outside of the clinic excluding No Patient Has Alerts: No cellular tissue based products placed in the center since last visit: Has Dressing in Place as Prescribed: Yes Pain Present Now: Yes Electronic Signature(s) Signed: 01/03/2022 6:26:46 PM By: [name] RN, BSN Entered By: [name] on 01/03/2022 16:19:21 -------------------------------------------------------------------------------- Encounter Discharge Information Details Patient Name: Date of Service: [name], [name] 01/03/2022 3:45 PM Medical Record Number: [id number] Patient Account Number: [id number] Date of Birth/Sex: Treating RN: Nov 12, 1955 (66 y.o. [name] Primary Care Milos Milligan: [name] Other Clinician: Referring Haskell Rihn: Treating Taleeyah Bora/Extender: Maxwell Marion in Treatment: 5 Encounter Discharge Information Items Post Procedure Vitals Discharge  Condition: Stable Temperature (F): 98.2 Ambulatory Status: Wheelchair Pulse (bpm): 87 Discharge Destination: Home Respiratory Rate (breaths/min): 18 Transportation: Private Auto Blood Pressure (mmHg): 175/81 Accompanied By: husband Schedule Follow-up Appointment: Yes Clinical Summary of Care: Patient Declined Electronic Signature(s) Signed: 01/03/2022 6:26:46 PM By: [name] RN, BSN Entered By: [name] on 01/03/2022 17:54:07 -------------------------------------------------------------------------------- Lower Extremity Assessment Details Patient Name: Date of Service: [name], [name] 01/03/2022 3:45 PM Medical Record Number: [id number] Patient Account Number: [id number] Date of Birth/Sex: Treating RN: [birth year] (66 y.o. [name] Primary Care Shericka Johnstone: [name] Other Clinician: Referring Gianny Sabino: Treating Favour Aleshire/Extender: Maxwell Marion in Treatment: 5 Edema Assessment Assessed: [Left: No] [Right: No] Edema: [Left: Yes] [Right: Yes] Calf Left: Right: Point of Measurement: 27 cm From Medial Instep 41 cm 42.5 cm Ankle Left: Right: Point of Measurement: 9 cm From Medial Instep 26.5 cm 28.6 cm Vascular Assessment Pulses: Dorsalis Pedis Palpable: [Left:Yes] [Right:Yes] Electronic Signature(s) Signed: 01/03/2022 6:26:46 PM By: [name] RN, BSN Entered By: [name] on 01/03/2022 16:39:23 -------------------------------------------------------------------------------- Multi Wound Chart Details Patient Name: Date of Service: [name], [name] 01/03/2022 3:45 PM Medical Record Number: [id number] Patient Account Number: [id number] Date of Birth/Sex: Treating RN: [birth year] (66 y.o. F) Primary Care Linas Stepter: [name] Other Clinician: Referring Katara Griner: Treating Jazelyn Sipe/Extender: Maxwell Marion in Treatment: 5 Vital Signs Height(in): 38 Pulse(bpm):  73 Weight(lbs): 169 Blood Pressure(mmHg): 175/81 Body Mass Index(BMI): 31.9 Temperature(F): 98.2 Respiratory Rate(breaths/min): 18 Photos: [11:No Photos Left Calcaneus] [12:No Photos Right, Plantar Foot] [13:No Photos Right, Medial Foot] Wound Location: [11:Gradually Appeared] [12:Gradually Appeared] [13:Gradually Appeared] Wounding Event: [11:Diabetic Wound/Ulcer of the Lower] [12:Diabetic Wound/Ulcer of the Lower] [13:Diabetic Wound/Ulcer of the Lower] Primary Etiology: [11:Extremity Cataracts, Anemia, Sleep Apnea,] [12:Extremity Cataracts, Anemia, Sleep Apnea,] [13:Extremity Cataracts, Anemia, Sleep Apnea,] Comorbid History: [11:Coronary Artery Disease, Hypertension, Myocardial Infarction, Type II Diabetes, Osteoarthritis, Osteomyelitis, Neuropathy 10/30/2020] [12:Coronary Artery Disease, Hypertension, Myocardial Infarction, Type II Diabetes, Osteoarthritis,  Osteomyelitis,  Neuropathy 09/29/2021] [13:Coronary Artery Disease, Hypertension, Myocardial Infarction, Type II Diabetes, Osteoarthritis, Osteomyelitis, Neuropathy 09/29/2021] Date Acquired: [11:5] [12:5] [13:5] Weeks of Treatment: [11:Open] [12:Open] [13:Healed - Epithelialized] Wound Status: [11:No] [12:No] [13:No] Wound Recurrence: [11:2.5x2.5x0.5] [12:1x1.5x0.1] [13:0x0x0] Measurements L x W x D (cm) [09:7.353] [12:1.178] [13:0] A (cm) : rea [11:2.454] [12:0.118] [13:0] Volume (cm) : [11:55.60%] [12:33.30%] [13:100.00%] % Reduction in A rea: [11:77.80%] [12:94.40%] [13:100.00%] % Reduction in Volume: [11:Grade 3] [12:Grade 3] [13:Grade 2] Classification: [11:Medium] [12:Medium] [13:None Present] Exudate A mount: [11:Serous] [12:Serosanguineous] [13:N/A] Exudate Type: [11:amber] [12:red, brown] [13:N/A] Exudate Color: [11:Flat and Intact] [12:Thickened] [13:Flat and Intact] Wound Margin: [11:Small (1-33%)] [12:None Present (0%)] [13:None Present (0%)] Granulation A mount: [11:Pink, Pale] [12:N/A] [13:N/A] Granulation Quality:  [11:Large (67-100%)] [12:Large (67-100%)] [13:None Present (0%)] Necrotic A mount: [11:Fat Layer (Subcutaneous Tissue): Yes Fat Layer (Subcutaneous Tissue): Yes Fascia: No] Exposed Structures: [11:Fascia: No Tendon: No Muscle: No Joint: No Bone: No None] [12:Fascia: No Tendon: No Muscle: No Joint: No Bone: No None] [13:Fat Layer (Subcutaneous Tissue): No Tendon: No Muscle: No Joint: No Bone: No Large (67-100%)] Epithelialization: [11:Debridement - Excisional] [12:Debridement - Excisional] [13:N/A] Debridement: Pre-procedure Verification/Time Out 16:53 [12:16:53] [13:N/A] Taken: [11:Fat, Subcutaneous, Slough] [12:Necrotic/Eschar, Callus,] [13:N/A] Tissue Debrided: [11:Skin/Subcutaneous Tissue] [12:Subcutaneous, Slough Skin/Subcutaneous Tissue] [13:N/A] Level: [11:6.25] [12:1.5] [13:N/A] Debridement A (sq cm): [11:rea Curette] [12:Curette] [13:N/A] Instrument: [11:Minimum] [12:Minimum] [13:N/A] Bleeding: [11:Pressure] [12:Pressure] [13:N/A] Hemostasis Achieved: [11:0] [12:0] [13:N/A] Procedural Pain: [11:0] [12:0] [13:N/A] Post Procedural Pain: [11:Procedure was tolerated well] [12:Procedure was tolerated well] [13:N/A] Debridement Treatment Response: [11:2.5x2.5x0.5] [12:1x1.5x0.1] [13:N/A] Post Debridement Measurements L x W x D (cm) [11:2.454] [12:0.118] [13:N/A] Post Debridement Volume: (cm) [11:Debridement] [12:Debridement] [13:N/A] Wound Number: [id number] 15 N/A Photos: No Photos No Photos N/A Right, Proximal, Lateral Foot Right, Distal, Lateral Foot N/A Wound Location: Gradually Appeared Gradually Appeared N/A Wounding Event: Diabetic Wound/Ulcer of the Lower Diabetic Wound/Ulcer of the Lower N/A Primary Etiology: Extremity Extremity Cataracts, Anemia, Sleep Apnea, Cataracts, Anemia, Sleep Apnea, N/A Comorbid History: Coronary Artery Disease, Coronary Artery Disease, Hypertension, Myocardial Infarction, Hypertension, Myocardial Infarction, Type II Diabetes, Osteoarthritis, Type II  Diabetes, Osteoarthritis, Osteomyelitis, Neuropathy Osteomyelitis, Neuropathy 09/29/2021 09/29/2021 N/A Date Acquired: 5 5 N/A Weeks of Treatment: Healed - Epithelialized Open N/A Wound Status: No No N/A Wound Recurrence: 0x0x0 1.2x1.1x0.1 N/A Measurements L x W x D (cm) 0 1.037 N/A A (cm) : rea 0 0.104 N/A Volume (cm) : 100.00% 62.50% N/A % Reduction in A rea: 100.00% 96.20% N/A % Reduction in Volume: Grade 2 Grade 2 N/A Classification: None Present Medium N/A Exudate A mount: N/A Serosanguineous N/A Exudate Type: N/A red, brown N/A Exudate Color: Flat and Intact Flat and Intact N/A Wound Margin: None Present (0%) Small (1-33%) N/A Granulation A mount: N/A Pink, Pale N/A Granulation Quality: None Present (0%) Large (67-100%) N/A Necrotic A mount: Fascia: No Fat Layer (Subcutaneous Tissue): Yes N/A Exposed Structures: Fat Layer (Subcutaneous Tissue): No Fascia: No Tendon: No Tendon: No Muscle: No Muscle: No Joint: No Joint: No Bone: No Bone: No Large (67-100%) None N/A Epithelialization: N/A Debridement - Selective/Open Wound N/A Debridement: Pre-procedure Verification/Time Out N/A 16:53 N/A Taken: N/A Slough N/A Tissue Debrided: N/A Non-Viable Tissue N/A Level: N/A 1.32 N/A Debridement A (sq cm): rea N/A Curette N/A Instrument: N/A Minimum N/A Bleeding: N/A Pressure N/A Hemostasis Achieved: N/A 0 N/A Procedural Pain: N/A 0 N/A Post Procedural Pain: N/A Procedure was tolerated well N/A Debridement Treatment Response: N/A 1.2x1.1x0.1 N/A Post Debridement Measurements L x W x D (cm) N/A 0.104 N/A  Post Debridement Volume: (cm) N/A Debridement N/A Procedures Performed: Treatment Notes Electronic Signature(s) Signed: 01/03/2022 5:17:15 PM By: Fredirick Maudlin MD FACS Entered By: Fredirick Maudlin on 01/03/2022 17:17:15 -------------------------------------------------------------------------------- Multi-Disciplinary Care Plan  Details Patient Name: Date of Service: [name], [name] 01/03/2022 3:45 PM Medical Record Number: [id number] Patient Account Number: [id number] Date of Birth/Sex: Treating RN: June 06, 1956 (66 y.o. [name] Primary Care Beverlee Wilmarth: [name] Other Clinician: Referring Anndee Connett: Treating Marsela Kuan/Extender: Maxwell Marion in Treatment: 5 Multidisciplinary Care Plan reviewed with physician Active Inactive Nutrition Nursing Diagnoses: Impaired glucose control: actual or potential Potential for alteratiion in Nutrition/Potential for imbalanced nutrition Goals: Patient/caregiver agrees to and verbalizes understanding of need to use nutritional supplements and/or vitamins as prescribed Date Initiated: 11/29/2021 Date Inactivated: 01/03/2022 Target Resolution Date: 12/30/2021 Goal Status: Met Patient/caregiver will maintain therapeutic glucose control Date Initiated: 11/29/2021 Target Resolution Date: 02/03/2022 Goal Status: Active Interventions: Assess HgA1c results as ordered upon admission and as needed Assess patient nutrition upon admission and as needed per policy Provide education on elevated blood sugars and impact on wound healing Provide education on nutrition Treatment Activities: Education provided on Nutrition : 11/29/2021 Notes: Wound/Skin Impairment Nursing Diagnoses: Impaired tissue integrity Knowledge deficit related to ulceration/compromised skin integrity Goals: Patient/caregiver will verbalize understanding of skin care regimen Date Initiated: 11/29/2021 Target Resolution Date: 02/03/2022 Goal Status: Active Interventions: Assess patient/caregiver ability to obtain necessary supplies Assess patient/caregiver ability to perform ulcer/skin care regimen upon admission and as needed Assess ulceration(s) every visit Provide education on ulcer and skin care Notes: Electronic Signature(s) Signed: 01/03/2022 6:26:46 PM By: [name] RN, BSN Entered By: [name] on 01/03/2022 17:52:16 -------------------------------------------------------------------------------- Pain Assessment Details Patient Name: Date of Service: [name], [name] 01/03/2022 3:45 PM Medical Record Number: [id number] Patient Account Number: [id number] Date of Birth/Sex: Treating RN: September 20, 1956 (66 y.o. [name] Primary Care Kalin Kyler: [name] Other Clinician: Referring Alphonse Asbridge: Treating Wenonah Milo/Extender: Maxwell Marion in Treatment: 5 Active Problems Location of Pain Severity and Description of Pain Patient Has Paino Yes Site Locations Rate the pain. Current Pain Level: 8 Pain Management and Medication Current Pain Management: Electronic Signature(s) Signed: 01/03/2022 6:26:46 PM By: [name] RN, BSN Entered By: [name] on 01/03/2022 16:19:53 -------------------------------------------------------------------------------- Patient/Caregiver Education Details Patient Name: Date of Service: [name], [name] 3/7/2023andnbsp3:45 PM Medical Record Number: [id number] Patient Account Number: [id number] Date of Birth/Gender: Treating RN: 1956/06/13 (66 y.o. [name] Primary Care Physician: [name] Other Clinician: Referring Physician: Treating Physician/Extender: Maxwell Marion in Treatment: 5 Education Assessment Education Provided To: Patient Education Topics Provided Wound/Skin Impairment: Methods: Explain/Verbal Responses: State content correctly Electronic Signature(s) Signed: 01/03/2022 6:26:46 PM By: [name] RN, BSN Entered By: [name] on 01/03/2022 17:52:27 -------------------------------------------------------------------------------- Wound Assessment Details Patient Name: Date of Service: [name], [name] 01/03/2022 3:45 PM Medical Record Number: [id number] Patient Account Number:  [id number] Date of Birth/Sex: Treating RN: 06/23/1956 (66 y.o. [name] Primary Care Joliene Salvador: [name] Other Clinician: Referring Savien Mamula: Treating Aurianna Earlywine/Extender: Maxwell Marion in Treatment: 5 Wound Status Wound Number: 11 Primary Diabetic Wound/Ulcer of the Lower Extremity Etiology: Wound Location: Left Calcaneus Wound Open Wounding Event: Gradually Appeared Status: Date Acquired: 10/30/2020 Comorbid Cataracts, Anemia, Sleep Apnea, Coronary Artery Disease, Weeks Of Treatment: 5 History: Hypertension, Myocardial Infarction, Type II Diabetes, Clustered Wound: No Osteoarthritis, Osteomyelitis, Neuropathy Wound Measurements Length: (cm) 2.5 Width: (cm) 2.5 Depth: (  cm) 0.5 Area: (cm) 4.909 Volume: (cm) 2.454 % Reduction in Area: 55.6% % Reduction in Volume: 77.8% Epithelialization: None Tunneling: No Undermining: No Wound Description Classification: Grade 3 Wound Margin: Flat and Intact Exudate Amount: Medium Exudate Type: Serous Exudate Color: amber Foul Odor After Cleansing: No Slough/Fibrino Yes Wound Bed Granulation Amount: Small (1-33%) Exposed Structure Granulation Quality: Pink, Pale Fascia Exposed: No Necrotic Amount: Large (67-100%) Fat Layer (Subcutaneous Tissue) Exposed: Yes Necrotic Quality: Adherent Slough Tendon Exposed: No Muscle Exposed: No Joint Exposed: No Bone Exposed: No Treatment Notes Wound #11 (Calcaneus) Wound Laterality: Left Cleanser Normal Saline Discharge Instruction: Cleanse the wound with Normal Saline prior to applying a clean dressing using gauze sponges, not tissue or cotton balls. Wound Cleanser Discharge Instruction: Cleanse the wound with wound cleanser prior to applying a clean dressing using gauze sponges, not tissue or cotton balls. Peri-Wound Care Topical Primary Dressing KerraCel Ag Gelling Fiber Dressing, 4x5 in (silver alginate) Discharge Instruction: Apply silver  alginate to wound bed as instructed Secondary Dressing Woven Gauze Sponge, Non-Sterile 4x4 in Discharge Instruction: Apply over primary dressing as directed. ABD Pad, 5x9 Discharge Instruction: Apply over primary dressing as directed. Secured With [employer], 4.5x3.1 (in/yd) Discharge Instruction: Secure with Kerlix as directed. 64M Medipore Soft Cloth Surgical T 2x10 (in/yd) ape Discharge Instruction: Secure with tape as directed. Compression Wrap Compression Stockings Add-Ons Electronic Signature(s) Signed: 01/03/2022 6:26:46 PM By: [name] RN, BSN Entered By: [name] on 01/03/2022 16:40:19 -------------------------------------------------------------------------------- Wound Assessment Details Patient Name: Date of Service: [name], [name] 01/03/2022 3:45 PM Medical Record Number: [id number] Patient Account Number: [id number] Date of Birth/Sex: Treating RN: Feb 27, 1956 (66 y.o. [name] Primary Care Daanish Copes: [name] Other Clinician: Referring Jorene Kaylor: Treating Jeffry Vogelsang/Extender: Maxwell Marion in Treatment: 5 Wound Status Wound Number: 12 Primary Diabetic Wound/Ulcer of the Lower Extremity Etiology: Wound Location: Right, Plantar Foot Wound Open Wounding Event: Gradually Appeared Status: Date Acquired: 09/29/2021 Comorbid Cataracts, Anemia, Sleep Apnea, Coronary Artery Disease, Weeks Of Treatment: 5 History: Hypertension, Myocardial Infarction, Type II Diabetes, Clustered Wound: No Osteoarthritis, Osteomyelitis, Neuropathy Wound Measurements Length: (cm) 1 Width: (cm) 1.5 Depth: (cm) 0.1 Area: (cm) 1.178 Volume: (cm) 0.118 % Reduction in Area: 33.3% % Reduction in Volume: 94.4% Epithelialization: None Tunneling: No Undermining: No Wound Description Classification: Grade 3 Wound Margin: Thickened Exudate Amount: Medium Exudate Type: Serosanguineous Exudate Color: red, brown Foul Odor  After Cleansing: No Slough/Fibrino Yes Wound Bed Granulation Amount: None Present (0%) Exposed Structure Necrotic Amount: Large (67-100%) Fascia Exposed: No Necrotic Quality: Adherent Slough Fat Layer (Subcutaneous Tissue) Exposed: Yes Tendon Exposed: No Muscle Exposed: No Joint Exposed: No Bone Exposed: No Treatment Notes Wound #12 (Foot) Wound Laterality: Plantar, Right Cleanser Normal Saline Discharge Instruction: Cleanse the wound with Normal Saline prior to applying a clean dressing using gauze sponges, not tissue or cotton balls. Wound Cleanser Discharge Instruction: Cleanse the wound with wound cleanser prior to applying a clean dressing using gauze sponges, not tissue or cotton balls. Peri-Wound Care Topical Primary Dressing KerraCel Ag Gelling Fiber Dressing, 4x5 in (silver alginate) Discharge Instruction: Apply silver alginate to wound bed as instructed Secondary Dressing Woven Gauze Sponge, Non-Sterile 4x4 in Discharge Instruction: Apply over primary dressing as directed. ABD Pad, 5x9 Discharge Instruction: Apply over primary dressing as directed. Secured With [employer], 4.5x3.1 (in/yd) Discharge Instruction: Secure with Kerlix as directed. 64M Medipore Soft Cloth Surgical T 2x10 (in/yd) ape Discharge Instruction: Secure with tape as directed. Compression Wrap  Compression Stockings Add-Ons Electronic Signature(s) Signed: 01/03/2022 6:26:46 PM By: [name] RN, BSN Entered By: [name] on 01/03/2022 16:41:11 -------------------------------------------------------------------------------- Wound Assessment Details Patient Name: Date of Service: [name], [name] 01/03/2022 3:45 PM Medical Record Number: [id number] Patient Account Number: [id number] Date of Birth/Sex: Treating RN: 29-May-1956 (66 y.o. [name] Primary Care Raihan Kimmel: [name] Other Clinician: Referring Dalaney Needle: Treating Jahzir Strohmeier/Extender: Maxwell Marion in Treatment: 5 Wound Status Wound Number: 13 Primary Diabetic Wound/Ulcer of the Lower Extremity Etiology: Wound Location: Right, Medial Foot Wound Healed - Epithelialized Wounding Event: Gradually Appeared Status: Date Acquired: 09/29/2021 Comorbid Cataracts, Anemia, Sleep Apnea, Coronary Artery Disease, Weeks Of Treatment: 5 History: Hypertension, Myocardial Infarction, Type II Diabetes, Clustered Wound: No Osteoarthritis, Osteomyelitis, Neuropathy Wound Measurements Length: (cm) Width: (cm) Depth: (cm) Area: (cm) Volume: (cm) 0 % Reduction in Area: 100% 0 % Reduction in Volume: 100% 0 Epithelialization: Large (67-100%) 0 Tunneling: No 0 Undermining: No Wound Description Classification: Grade 2 Wound Margin: Flat and Intact Exudate Amount: None Present Foul Odor After Cleansing: No Slough/Fibrino No Wound Bed Granulation Amount: None Present (0%) Exposed Structure Necrotic Amount: None Present (0%) Fascia Exposed: No Fat Layer (Subcutaneous Tissue) Exposed: No Tendon Exposed: No Muscle Exposed: No Joint Exposed: No Bone Exposed: No Electronic Signature(s) Signed: 01/03/2022 6:26:46 PM By: [name] RN, BSN Entered By: [name] on 01/03/2022 16:41:52 -------------------------------------------------------------------------------- Wound Assessment Details Patient Name: Date of Service: [name], [name] 01/03/2022 3:45 PM Medical Record Number: [id number] Patient Account Number: [id number] Date of Birth/Sex: Treating RN: 25-Jul-1956 (66 y.o. [name] Primary Care Jeilani Grupe: [name] Other Clinician: Referring Alaisha Eversley: Treating Irais Mottram/Extender: Maxwell Marion in Treatment: 5 Wound Status Wound Number: 14 Primary Diabetic Wound/Ulcer of the Lower Extremity Etiology: Wound Location: Right, Proximal, Lateral Foot Wound Healed - Epithelialized Wounding Event:  Gradually Appeared Status: Date Acquired: 09/29/2021 Comorbid Cataracts, Anemia, Sleep Apnea, Coronary Artery Disease, Weeks Of Treatment: 5 History: Hypertension, Myocardial Infarction, Type II Diabetes, Clustered Wound: No Osteoarthritis, Osteomyelitis, Neuropathy Wound Measurements Length: (cm) Width: (cm) Depth: (cm) Area: (cm) Volume: (cm) 0 % Reduction in Area: 100% 0 % Reduction in Volume: 100% 0 Epithelialization: Large (67-100%) 0 Tunneling: No 0 Undermining: No Wound Description Classification: Grade 2 Wound Margin: Flat and Intact Exudate Amount: None Present Foul Odor After Cleansing: No Slough/Fibrino No Wound Bed Granulation Amount: None Present (0%) Exposed Structure Necrotic Amount: None Present (0%) Fascia Exposed: No Fat Layer (Subcutaneous Tissue) Exposed: No Tendon Exposed: No Muscle Exposed: No Joint Exposed: No Bone Exposed: No Electronic Signature(s) Signed: 01/03/2022 6:26:46 PM By: [name] RN, BSN Entered By: [name] on 01/03/2022 16:44:04 -------------------------------------------------------------------------------- Wound Assessment Details Patient Name: Date of Service: [name], [name] 01/03/2022 3:45 PM Medical Record Number: [id number] Patient Account Number: [id number] Date of Birth/Sex: Treating RN: [birth year] (66 y.o. [name] Primary Care Cas Tracz: [name] Other Clinician: Referring Roderick Calo: Treating Dearra Myhand/Extender: Maxwell Marion in Treatment: 5 Wound Status Wound Number: 15 Primary Diabetic Wound/Ulcer of the Lower Extremity Etiology: Wound Location: Right, Distal, Lateral Foot Wound Open Wounding Event: Gradually Appeared Status: Date Acquired: 09/29/2021 Comorbid Cataracts, Anemia, Sleep Apnea, Coronary Artery Disease, Weeks Of Treatment: 5 History: Hypertension, Myocardial Infarction, Type II Diabetes, Clustered Wound: No Osteoarthritis, Osteomyelitis,  Neuropathy Wound Measurements Length: (cm) 1.2 Width: (cm) 1.1 Depth: (cm) 0.1 Area: (cm) 1.037 Volume: (cm) 0.104 % Reduction in Area: 62.5% % Reduction in Volume: 96.2%  Epithelialization: None Tunneling: No Undermining: No Wound Description Classification: Grade 2 Wound Margin: Flat and Intact Exudate Amount: Medium Exudate Type: Serosanguineous Exudate Color: red, brown Foul Odor After Cleansing: No Slough/Fibrino Yes Wound Bed Granulation Amount: Small (1-33%) Exposed Structure Granulation Quality: Pink, Pale Fascia Exposed: No Necrotic Amount: Large (67-100%) Fat Layer (Subcutaneous Tissue) Exposed: Yes Necrotic Quality: Adherent Slough Tendon Exposed: No Muscle Exposed: No Joint Exposed: No Bone Exposed: No Electronic Signature(s) Signed: 01/03/2022 6:26:46 PM By: [name] RN, BSN Entered By: [name] on 01/03/2022 16:43:28 -------------------------------------------------------------------------------- [place] Details Patient Name: Date of Service: [name], [name] 01/03/2022 3:45 PM Medical Record Number: [id number] Patient Account Number: [id number] Date of Birth/Sex: Treating RN: 10-Dec-1955 (66 y.o. [name] Primary Care Pearly Apachito: [name] Other Clinician: Referring Joely Losier: Treating Kaelob Persky/Extender: Maxwell Marion in Treatment: 5 Vital Signs Time Taken: 16:19 Temperature (F): 98.2 Height (in): 61 Pulse (bpm): 87 Weight (lbs): 169 Respiratory Rate (breaths/min): 18 Body Mass Index (BMI): 31.9 Blood Pressure (mmHg): 175/81 Reference Range: 80 - 120 mg / dl Electronic Signature(s) Signed: 01/03/2022 6:26:46 PM By: [name] RN, BSN Entered By: [name] on 01/03/2022 16:19:40

## 2022-01-03 NOTE — Progress Notes (Signed)
KARIMAH, WINQUIST ([id number]) Visit Report for 01/03/2022 Chief Complaint Document Details Patient Name: Date of Service: [name], [name] 01/03/2022 3:45 PM Medical Record Number: [id number] Patient Account Number: [id number] Date of Birth/Sex: Treating RN: 11-19-1955 (66 y.o. F) Primary Care Provider: [name] Other Clinician: Referring Provider: Treating Provider/Extender: Maxwell Marion in Treatment: 5 Information Obtained from: Patient Chief Complaint Left heel ulcer 01/10/2021; patient returns to clinic with 2 large wounds on her bilateral heels 11/29/2021; patient returns to clinic with substantial wounds on the right foot and left heel. Electronic Signature(s) Signed: 01/03/2022 5:17:31 PM By: Fredirick Maudlin MD FACS Entered By: Fredirick Maudlin on 01/03/2022 17:17:31 -------------------------------------------------------------------------------- Debridement Details Patient Name: Date of Service: [name], [name] 01/03/2022 3:45 PM Medical Record Number: [id number] Patient Account Number: [id number] Date of Birth/Sex: Treating RN: August 06, 1956 (66 y.o. [name] Primary Care Provider: [name] Other Clinician: Referring Provider: Treating Provider/Extender: Maxwell Marion in Treatment: 5 Debridement Performed for Assessment: Wound #11 Left Calcaneus Performed By: Physician Fredirick Maudlin, MD Debridement Type: Debridement Severity of Tissue Pre Debridement: Fat layer exposed Level of Consciousness (Pre-procedure): Awake and Alert Pre-procedure Verification/Time Out Yes - 16:53 Taken: Start Time: 16:53 T Area Debrided (L x W): otal 2.5 (cm) x 2.5 (cm) = 6.25 (cm) Tissue and other material debrided: Non-Viable, Fat, Slough, Subcutaneous, Slough Level: Skin/Subcutaneous Tissue Debridement Description: Excisional Instrument: Curette Bleeding: Minimum Hemostasis Achieved: Pressure End  Time: 16:55 Procedural Pain: 0 Post Procedural Pain: 0 Response to Treatment: Procedure was tolerated well Level of Consciousness (Post- Awake and Alert procedure): Post Debridement Measurements of Total Wound Length: (cm) 2.5 Width: (cm) 2.5 Depth: (cm) 0.5 Volume: (cm) 2.454 Character of Wound/Ulcer Post Debridement: Improved Severity of Tissue Post Debridement: Fat layer exposed Post Procedure Diagnosis Same as Pre-procedure Electronic Signature(s) Signed: 01/03/2022 5:26:37 PM By: Fredirick Maudlin MD FACS Signed: 01/03/2022 6:26:46 PM By: [name] RN, BSN Entered By: [name] on 01/03/2022 16:54:20 -------------------------------------------------------------------------------- Debridement Details Patient Name: Date of Service: [name], [name] 01/03/2022 3:45 PM Medical Record Number: [id number] Patient Account Number: [id number] Date of Birth/Sex: Treating RN: 16-Apr-1956 (66 y.o. [name] Primary Care Provider: [name] Other Clinician: Referring Provider: Treating Provider/Extender: Maxwell Marion in Treatment: 5 Debridement Performed for Assessment: Wound #12 Right,Plantar Foot Performed By: Physician Fredirick Maudlin, MD Debridement Type: Debridement Severity of Tissue Pre Debridement: Fat layer exposed Level of Consciousness (Pre-procedure): Awake and Alert Pre-procedure Verification/Time Out Yes - 16:53 Taken: Start Time: 16:53 T Area Debrided (L x W): otal 1 (cm) x 1.5 (cm) = 1.5 (cm) Tissue and other material debrided: Non-Viable, Callus, Eschar, Slough, Subcutaneous, Slough Level: Skin/Subcutaneous Tissue Debridement Description: Excisional Instrument: Curette Bleeding: Minimum Hemostasis Achieved: Pressure End Time: 16:55 Procedural Pain: 0 Post Procedural Pain: 0 Response to Treatment: Procedure was tolerated well Level of Consciousness (Post- Awake and Alert procedure): Post Debridement  Measurements of Total Wound Length: (cm) 1 Width: (cm) 1.5 Depth: (cm) 0.1 Volume: (cm) 0.118 Character of Wound/Ulcer Post Debridement: Improved Severity of Tissue Post Debridement: Fat layer exposed Post Procedure Diagnosis Same as Pre-procedure Electronic Signature(s) Signed: 01/03/2022 5:26:37 PM By: Fredirick Maudlin MD FACS Signed: 01/03/2022 6:26:46 PM By: [name] RN, BSN Entered By: [name] on 01/03/2022 16:55:43 -------------------------------------------------------------------------------- Debridement Details Patient Name: Date of Service: [name], [name] 01/03/2022 3:45 PM Medical Record Number: [id number] Patient Account Number: [id number] Date of Birth/Sex: Treating RN:  1955/12/11 (66 y.o. [name] Primary Care Provider: [name] Other Clinician: Referring Provider: Treating Provider/Extender: Maxwell Marion in Treatment: 5 Debridement Performed for Assessment: Wound #15 Right,Lateral Foot Performed By: Physician Fredirick Maudlin, MD Debridement Type: Debridement Severity of Tissue Pre Debridement: Fat layer exposed Level of Consciousness (Pre-procedure): Awake and Alert Pre-procedure Verification/Time Out Yes - 16:53 Taken: Start Time: 16:53 T Area Debrided (L x W): otal 1.2 (cm) x 1.1 (cm) = 1.32 (cm) Tissue and other material debrided: Non-Viable, Slough, Slough Level: Non-Viable Tissue Debridement Description: Selective/Open Wound Instrument: Curette Bleeding: Minimum Hemostasis Achieved: Pressure End Time: 16:55 Procedural Pain: 0 Post Procedural Pain: 0 Response to Treatment: Procedure was tolerated well Level of Consciousness (Post- Awake and Alert procedure): Post Debridement Measurements of Total Wound Length: (cm) 1.2 Width: (cm) 1.1 Depth: (cm) 0.1 Volume: (cm) 0.104 Character of Wound/Ulcer Post Debridement: Improved Severity of Tissue Post Debridement: Fat layer exposed Post  Procedure Diagnosis Same as Pre-procedure Electronic Signature(s) Signed: 01/03/2022 5:26:37 PM By: Fredirick Maudlin MD FACS Signed: 01/03/2022 6:26:46 PM By: [name] RN, BSN Entered By: [name] on 01/03/2022 17:01:16 -------------------------------------------------------------------------------- HPI Details Patient Name: Date of Service: [name], [name] 01/03/2022 3:45 PM Medical Record Number: [id number] Patient Account Number: [id number] Date of Birth/Sex: Treating RN: 28-Apr-1956 (66 y.o. F) Primary Care Provider: [name] Other Clinician: Referring Provider: Treating Provider/Extender: Maxwell Marion in Treatment: 5 History of Present Illness HPI Description: ADMISSION 11/12/2018 This is a 66 year old woman with type 2 diabetes and diabetic neuropathy. She has been dealing with a left heel plantar wound for roughly 2 years. She states this started when she pulled some skin off the area and it progressed into a wound. She also has an area on the tip of her left great toe for 1 year. She has been largely followed by Dr. Sharol Given and she had an excision of bone in the left heel in 2017. I do not see microbiology from this excision or pathology. Apparently this wound never really closed. She most recently has been using Silvadene cream. Offloading this with a scooter. The last MRI I see was in June 2018 which did not show osteomyelitis at that time. Apparently this wound is never really progressed towards healing. During her last review by Dr. Sharol Given in October it was recommended that she undergo a left BKA and she refused. She went to see a second orthopedic consult at [facility] who am recommended conservative wound care to see if this will close or progress towards closure but also warned that possible surgery may be necessary. An x-ray that was done at [facility] showed an irregular calcaneal body and calcaneal tuberosity. This is probably  postprocedural. Previous x-rays at [facility] had suggested heterotrophic calcifications but I do not see this. Apparently a second ointment was added to the Silvadene which the patient thinks is because some improvement. She has not been systemically unwell. No fever or chills. She thinks the second ointment that was given to her at [facility] has helped somewhat. 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 ABIs done in June 2018 showed a ABI of 1.12 on the right and 1.1 on the  left she was biphasic bilaterally. ABIs in our clinic were noncompressible today. 11/20/18 on evaluation today patient is seen for her second visit here in the office although this is actually the first visit with me concerning an issue that she's having with her great toe and heel of the left foot. Fortunately she does not appear to be have any discomfort at this time and she does have a scooter in order to offload her foot she also has a boot for offloading. With that being said this is something that appears to have been going on for some time she was seeing Dr. due to his recommendation was that she was going to require a below knee amputation. With that being said she wanted to come to the wound center but according to the patient Dr. Sharol Given told her that "we could not help her". Nonetheless she saw another provider who suggested that it may be worth a shot for [name] to try and help her out if it all possible. Nonetheless she decided that it would be worth a trial since otherwise any the way she's gonna end up with an amputation. Obviously if we can heal the wound then that will not be the case. Again I explained to her that obviously there  are no guarantees but we will give this a good try and attempts to get the wound to heal. 1/30; the patient continues to have areas on the left plantar heel and the left plantar great toe. The more worrisome area is the heel. She went for MRI on Saturday but this could not be done because she had silver alginate in the wound bed. I would like to get this rebooked. If she does not have osteomyelitis she will need a total contact cast. We have been using silver alginate in the wounds 2/7; patient has wounds on her left plantar heel and left plantar great toe. The area on the heel has some depth although it looks about the same today. Her MRI will finally be done tomorrow. If she has osteomyelitis in the heel and then we will need to consider her for IV antibiotics and hyperbaric oxygen. If the MRI is negative she will need a total contact cast although she is wearing a cam boot and using a scooter at present. Will be using silver alginate. She will take it off tomorrow in preparation for the MRI 2/14; the patient's MRI showed extensive surgical changes with a large portion of her calcaneus removed on the left secondary to her previous surgery by Dr. Sharol Given however there is no evidence of osteomyelitis. She would therefore be a candidate for a total contact cast and we applied this for the first time today 2/21; silver collagen total contact cast. The area on the plantar left great toe is "healed" still a lot of callus on this area. Dimensions on the plantar heel not too much different some epithelialization is present however. This is an improvement 12/25/18 on evaluation today patient actually is seen for follow-up concerning issues that she has been having with her cast she states she feels like it's wet and squishy in the bottom of her cast. The reason she does come in to have this evaluated and see what is going on. Fortunately there's no signs of infection at this time was the cast was removed. 3/6;  the patient's area on the tip of her right great toe is callused but there is no open area here. She still has the fairly extensive area  in the left heel. We have been using silver collagen in the wound. 01/08/19 on evaluation today patient actually appears to be doing very well in regard to her heel ulcer in my pinion to see if you shown signs of improvement which is excellent news. There's no evidence of infection. She continues to have quite a bit of drainage but fortunately again this doesn't seem to be hindering her healing. 3/20 -Patient's foot ulcer on the left appears to be doing well, the dimensions appear encouraging, we have been using total contact cast with Prisma and will continue doing that 3/27; patient continues to make nice progress on the left plantar heel. She is using silver collagen under a total contact cast. It is been a while since I have seen this wound and it really looks a lot better. Smaller with healthy granulation 4/3; she continues to make nice progress on the left plantar heel. Using silver collagen under a total contact cast 4/10; left plantar heel. Again skin over the surface of the wound with not much in the way of adherence. This results in undermining. Using silver collagen changed to silver alginate 4/17 left plantar heel. Again not much improvement. There is no undermining today no debridement was required we used silver alginate last time because of excessive moisture 4/24 left plantar heel. Again not as much improvement as I would have liked. About 3 mm of depth. Thick callused skin around the circumference. Using silver alginate 5/1; left plantar heel this is improved this week. Less depth epithelialization is present. Using silver alginate alginate under a total contact cast 5/8; left plantar heel dimensions are about the same. The depth appears to be improved. I use silver collagen starting today under the cast 5/15 left plantar heel. Arrives today with a  2-day history of feeling like something had "slipped" while walking her dog in the cast. Unfortunately extensive area relatively to the small wound of undermining superiorly denuded epithelium. 5/22-Patient returns at 1 week after being taken off the T[facility] on account of some fluid collection that was debrided and cultured at last visit from the plantar ulcer on the left heel. The culture results are polymicrobial with Klebsiella, enterococcus, Proteus growth these organisms are sensitive to Cipro except with enterococcus which is sensitive to ampicillin but patient is highly allergic to penicillin according to her. 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 5/29; the patient arrived last week with a new wound on her left plantar great toe. With regards to the culture that I did 2 weeks ago of her deteriorating heel wound this grew Klebsiella and enterococcus. She was given Cipro however the enterococcus would not be covered well by a quinolone. She is allergic to penicillin. I will give her linezolid 600 twice daily x5 days 6/12; the patient continues to have a wound on her left and after she returns to the beach there is undermining laterally. She has the new wound from this 2 weeks or so ago on the plantar tip of her left great toe. She had a fall today scraping the dorsal surface of the left fifth 7/14; READMISSION since the patient was last here she was hospitalized from 04/15/2019 through 04/22/2019. She had presented to an outside ER after falling and hitting her elbow. She had had previous surgery on the elbow and fractures several years ago. An x-ray was negative and she was sent home. She is  readmitted with sepsis and acute renal failure secondary to septic arthritis of the elbow. Cultures apparently showed pansensitive staph aureus however she has a severe beta-lactam allergy. She was treated with vancomycin.  Apparently the vancomycin is completed and her PICC line is removed although she is seeing Dr. Megan Salon tomorrow due to continued pain and swelling. In the hospital she had an IandD by Dr. [name] of orthopedics. The original surgery was on 04/17/2019 and it was felt that she had septic arthritis. She is still having a lot of pain and swelling in the right elbow and apparently is due to see Dr. Megan Salon tomorrow and what she thinks is that she will be restarted on antibiotics. With regards to her heel wounds/left foot wounds. Her arterial studies were checked and her ABIs were within normal limits. X-ray showed plantar foot ulcer negative for osteomyelitis postop resection of the posterior calcaneus. She has been using silver alginate to the heel 8/4-Patient returns after being seen on 7/14, we are using silver alginate to the calcaneal wound, she is continuing to receive care for her right elbow septic arthritis 8/14- Patient returns after 1 week, she is being seen by the surgical group for her right elbow, she is here for the left calcaneal wound for which we are using silver alginate this is about the same 8/21; this is a patient I readmitted to the clinic 5 weeks ago. She is continuing to have difficulties with a septic arthritis of the right elbow she had after a fall and apparently has had surgical IandD's since the last time we saw her. She is also following with Dr. Megan Salon of infectious disease and is apparently on 3 oral antibiotics although at the time of this dictation I am not sure what they are. She comes in today with a necrotic surface on the left great toe with a blister laterally. This was still clearly an open wound. She had purulent drainage coming out of this. The original wound on the plantar aspect of her right heel in the setting of a Charcot foot is deep not open to bone but certainly not any better at all. We have been using silver alginate 8/28; dealing with septic  arthritis of the right elbow. May need to go for further surgery here in the second week of September. She had a blister on the left great toe that was purulent [place] draining last week although a culture did not grow anything [already on antibiotics through infectious disease for her elbow]. The punched-out area on her heel is just like it was when she first came in. This almost closed with a total contact cast there are no options for that now. The patient states she cannot stay off her foot having to do housework X-ray of the foot showed a moderate bunion and severe degenerative changes at the first metatarsal phalangeal joint there was a moderate plantar calcaneal spur. We will need to check the location of this. 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 It is oral. We are using silver collagen to the punched-out area on her left heel and to the summer superficial area of the left great toe. She is offloading this is best she can although judging by the amount of callus it is not enough. She thinks she has enough strength in her right elbow now  to use her scooter. 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 Hale Bogus notes and infectious disease. We are using silver collagen to the punched-out areas on her left heel and to the plantar aspect of her left great toe she is now using her scooter to get around and to help offload these areas 10/2; 2-week follow-up. Still on clindamycin I believe for the left elbow she is going for an MRI of the elbow over the weekend. She is then going to see orthopedics and infectious disease. The area on the left heel is certainly no better. This does  not probe to bone however there is green drainage. She has an area on the tip of her toe. The area on the heel is in a wound we almost closed at one point with total contact casting. 10/9; one-week follow-up. Culture I did of the heel last week which was a swab culture admittedly showed moderate Enterococcus faecalis moderate Pseudomonas. The wound itself looks about the same. There is no palpable bone although the depth of it closely approximates bone. The heel is swollen but not erythematous. Her MRI is booked for next week. She also has an MRI of the right elbow. I've also lifted the last consult from Dr. Megan Salon who is following her for septic arthritis of the right elbow. I did not see him specifically comment on her left heel however the patient states that he is aware of this. I did not specifically address the organisms I cultured last week because of the possible effect of any additional cultures that will be done on the elbow. Additionally these were superficial not bone cultures 10/16; her MRI of the heel was put back to next week. She did have her elbow done. I have been in contact with Dr. Megan Salon about my concerns about osteomyelitis of the left heel. We have been using collagen. She also has a wound on the plantar tip of her first toe 10/23; her MRI of the heel was negative for osteomyelitis but suggested a small abscess. She also had an MRI of her elbow that showed findings compatible with a septic joint. She went to see Dr. Megan Salon and she is going to have both oral and IV antibiotics which I am sure will cover any infection in the heel. I did not actually see his note today. We are using silver collagen offloading the heel with a heel offloading boot 11/6; patient continues with silver collagen to both wound areas on her plantar left heel and plantar left great toe. She remains on daptomycin by Dr. Megan Salon of infectious disease. She complains of diarrhea. Dr. Megan Salon is aware of  this. 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 she has been put on oral vancomycin I think a lot of high clinical suspicion for pseudomembranous colitis Because of the right elbow there are no options to completely off her left foot beyond the heel offloading boot we have now. Fortunately the left heel ulcer itself has remained static some improvement in the plantar left great toe 11/20; patient arrives for review of her left heel ulcer. I also note she has a difficult time with septic arthritis of the  right elbow. She currently is on IV daptomycin which seems to have helped the drainage in her elbow [MSSA]. Because of high concern for pseudomembranous colitis she was also put on vancomycin orally and Flagyl orally. She was on Levaquin but that was discontinued because of the concern about C. difficile. She states her diarrhea is better. She arrives in clinic today with a large area of denuded skin on the lateral part of the heel. This had totally separated. We have been using silver alginate to the wound areas. She arrived in a scooter telling me she is offloading the foot is much as possible. 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 Dr. Megan Salon stopped the daptomycin and now has her on vancomycin 1000 mg IV daily. This is supposed to go onto 1/18. She is being referred to [place] for what sounds like a elbow replacement type operation for her MSSA septic arthritis. She arrives in clinic today with a blister on the medial part of the wound on her heel. She still has a lot of callus around the heel although the surface of the wound on the left heel looks somewhat  better. READMISSION 03/19/2020 Mrs. Lasorsa is a 66 year old woman we have followed for almost all of 2020 with a neuropathic wound on her left heel and the tip of her left great toe.. We she had previously had a partial calcanectomy by Dr. Sharol Given because of underlying osteomyelitis. She also had a history of MRSA even when we admitted her to the clinic. An MRI when she first came into our clinic did not show osteomyelitis. We put her in a total contact cast and gradually this contracted to the point it was almost closed however in that same timeframe she had a fall. 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 We had to stop putting her in a cast partially because of infection and partially because she could not support herself with the elbow. Things went progressively downhill. When we last saw her at the end of December she had a large open wound on the left heel. She had had recurrent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 on her right foot have healed. She continues to have the large chronic ulcer on the plantar surface of her left calcaneus. 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 The plantar aspect of the fifth metatarsal head on the right has thick eschar overlying the ulcer. Patient History Information obtained from Patient. Family History Diabetes - Maternal Grandparents,Paternal Grandparents,Father,Siblings, Heart Disease - [place], Hypertension - Father, No family history of Cancer, Hereditary Spherocytosis, Kidney Disease, Lung Disease, Seizures, Stroke, Thyroid Problems, Tuberculosis. Social History Never smoker, Marital Status - Married, Alcohol Use - Never, Drug Use - No History, Caffeine Use - Moderate - tea. Medical History Eyes Patient has history of Cataracts - left eye removed, mild right eye Denies history of Glaucoma, Optic Neuritis Ear/Nose/Mouth/Throat Denies history of Chronic sinus problems/congestion, Middle ear problems Hematologic/Lymphatic Patient has history of Anemia Denies history of Hemophilia, Human Immunodeficiency Virus, Lymphedema, Sickle Cell Disease Respiratory Patient has history of Sleep Apnea Denies history of Aspiration, Asthma, Chronic Obstructive Pulmonary Disease (COPD), Pneumothorax,  Tuberculosis Cardiovascular Patient has history of Coronary Artery Disease, Hypertension, Myocardial Infarction - 2014 Gastrointestinal Denies history of Cirrhosis , Colitis, Crohnoos, Hepatitis A, Hepatitis B, Hepatitis C Endocrine Patient has history of Type II Diabetes Denies history of Type I Diabetes Immunological Denies history of Lupus Erythematosus, Raynaudoos, Scleroderma Integumentary (Skin) Denies history of History of Burn Musculoskeletal Patient has history of Osteoarthritis, Osteomyelitis - left and right foot Denies history of Gout, Rheumatoid Arthritis Neurologic Patient has history of Neuropathy Denies history of Dementia, Quadriplegia, Paraplegia, Seizure Disorder Oncologic Denies history of Received Chemotherapy, Received Radiation Psychiatric Denies history of Anorexia/bulimia, Confinement Anxiety Hospitalization/Surgery History - left heel surgery. - hysterectomy. - cholecystectomy. - right elbow surgery x4. - December 2021. Medical A Surgical History Notes nd Constitutional Symptoms (General Health) morbid obesity, fibromyalgia Eyes macular degeneration left eye  Ear/Nose/Mouth/Throat seasonal allergies Cardiovascular hyperlipidemia Gastrointestinal fatty liver Genitourinary CKD stage 3 Neurologic cervical ruptured disc Objective Constitutional She is hypertensive. No acute distress. Vitals Time Taken: 4:19 PM, Height: 61 in, Weight: 169 lbs, BMI: 31.9, Temperature: 98.2 F, Pulse: 87 bpm, Respiratory Rate: 18 breaths/min, Blood Pressure: 175/81 mmHg. Respiratory Normal work of breathing on room air. General Notes: 01/03/2022: Wound examoothere are now only 2 wounds on the right foot. 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 Granulation tissue was appreciated beneath this. On the right fifth metatarsal head plantar surface, the wound is covered with  heavy eschar. Once this was removed, a deep pocket was identified, but no purulent drainage was present. On the left, eschar and callus were debrided from around the perimeter of the large plantar ulcer. Integumentary (Hair, Skin) Wound #11 status is Open. Original cause of wound was Gradually Appeared. The date acquired was: 10/30/2020. The wound has been in treatment 5 weeks. The wound is located on the Left Calcaneus. The wound measures 2.5cm length x 2.5cm width x 0.5cm depth; 4.909cm^2 area and 2.454cm^3 volume. There is Fat Layer (Subcutaneous Tissue) exposed. There is no tunneling or undermining noted. There is a medium amount of serous drainage noted. The wound margin is flat and intact. There is small (1-33%) pink, pale granulation within the wound bed. There is a large (67-100%) amount of necrotic tissue within the wound bed including Adherent Slough. Wound #12 status is Open. Original cause of wound was Gradually Appeared. The date acquired was: 09/29/2021. The wound has been in treatment 5 weeks. The wound is located on the [place]. The wound measures 1cm length x 1.5cm width x 0.1cm depth; 1.178cm^2 area and 0.118cm^3 volume. There is Fat Layer (Subcutaneous Tissue) exposed. There is no tunneling or undermining noted. There is a medium amount of serosanguineous drainage noted. The wound margin is thickened. There is no granulation within the wound bed. There is a large (67-100%) amount of necrotic tissue within the wound bed including Adherent Slough. Wound #13 status is Healed - Epithelialized. Original cause of wound was Gradually Appeared. The date acquired was: 09/29/2021. The wound has been in treatment 5 weeks. The wound is located on the Right,Medial Foot. The wound measures 0cm length x 0cm width x 0cm depth; 0cm^2 area and 0cm^3 volume. There is no tunneling or undermining noted. There is a none present amount of drainage noted. The wound margin is flat and intact. There  is no granulation within the wound bed. There is no necrotic tissue within the wound bed. Wound #14 status is Healed - Epithelialized. Original cause of wound was Gradually Appeared. The date acquired was: 09/29/2021. The wound has been in treatment 5 weeks. The wound is located on the Right,Proximal,Lateral Foot. The wound measures 0cm length x 0cm width x 0cm depth; 0cm^2 area and 0cm^3 volume. There is no tunneling or undermining noted. There is a none present amount of drainage noted. The wound margin is flat and intact. There is no granulation within the wound bed. There is no necrotic tissue within the wound bed. Wound #15 status is Open. Original cause of wound was Gradually Appeared. The date acquired was: 09/29/2021. The wound has been in treatment 5 weeks. The wound is located on the Right,Lateral Foot. The wound measures 1.2cm length x 1.1cm width x 0.1cm depth; 1.037cm^2 area and 0.104cm^3 volume. There is Fat Layer (Subcutaneous Tissue) exposed. There  is no tunneling or undermining noted. There is a medium amount of serosanguineous drainage noted. The wound margin is flat and intact. There is small (1-33%) pink, pale granulation within the wound bed. There is a large (67-100%) amount of necrotic tissue within the wound bed including Adherent Slough. 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 There was a Excisional Skin/Subcutaneous Tissue Debridement with a total area of 6.25 sq cm performed by Fredirick Maudlin,  MD. With the following instrument(s): Curette to remove Non-Viable tissue/material. Material removed includes Fat, Subcutaneous Tissue, and Slough. No specimens were taken. A time out was conducted at 16:53, prior to the start of the procedure. A Minimum amount of bleeding was controlled with Pressure. The procedure was tolerated well with a pain level of 0 throughout and a pain level of 0 following the procedure. Post Debridement Measurements: 2.5cm length x 2.5cm width x 0.5cm depth; 2.454cm^3 volume. Character of Wound/Ulcer Post Debridement is improved. Severity of Tissue Post Debridement is: Fat layer exposed. Post procedure Diagnosis Wound #11: Same as Pre-Procedure Wound #12 Pre-procedure diagnosis of Wound #12 is a Diabetic Wound/Ulcer of the Lower Extremity located on the Right,Plantar Foot .Severity of Tissue Pre Debridement is: Fat layer exposed. There was a Excisional Skin/Subcutaneous Tissue Debridement with a total area of 1.5 sq cm performed by Fredirick Maudlin, MD. With the following instrument(s): Curette to remove Non-Viable tissue/material. Material removed includes Eschar, Callus, Subcutaneous Tissue, and Slough. No specimens were taken. A time out was conducted at 16:53, prior to the start of the procedure. A Minimum amount of bleeding was controlled with Pressure. The procedure was tolerated well with a pain level of 0 throughout and a pain level of 0 following the procedure. Post Debridement Measurements: 1cm length x 1.5cm width x 0.1cm depth; 0.118cm^3 volume. Character of Wound/Ulcer Post Debridement is improved. Severity of Tissue Post Debridement is: Fat layer exposed. Post procedure Diagnosis Wound #12: Same as Pre-Procedure Wound #15 Pre-procedure diagnosis of Wound #15 is a Diabetic Wound/Ulcer of the Lower Extremity located on the Right,Lateral Foot .Severity of Tissue Pre Debridement is: Fat layer exposed. There was a Selective/Open Wound Non-Viable Tissue  Debridement with a total area of 1.32 sq cm performed by Fredirick Maudlin, MD. With the following instrument(s): Curette to remove Non-Viable tissue/material. Material removed includes [facility]. No specimens were taken. A time out was conducted at 16:53, prior to the start of the procedure. A Minimum amount of bleeding was controlled with Pressure. The procedure was tolerated well with a pain level of 0 throughout and a pain level of 0 following the procedure. Post Debridement Measurements: 1.2cm length x 1.1cm width x 0.1cm depth; 0.104cm^3 volume. Character of Wound/Ulcer Post Debridement is improved. Severity of Tissue Post Debridement is: Fat layer exposed. Post procedure Diagnosis Wound #15: Same as Pre-Procedure Plan Follow-up Appointments: Return Appointment in 1 week. - Dr. Celine Ahr Bathing/ Shower/ Hygiene: May shower with protection but do not get wound dressing(s) wet. Edema Control - Lymphedema / SCD / Other: Elevate legs to the level of the heart or above for 30 minutes  daily and/or when sitting, a frequency of: - throughout the day Avoid standing for long periods of time. Moisturize legs daily. WOUND #11: - Calcaneus Wound Laterality: Left Cleanser: Normal Saline Every Other Day/30 Days Discharge Instructions: Cleanse the wound with Normal Saline prior to applying a clean dressing using gauze sponges, not tissue or cotton balls. Cleanser: Wound Cleanser Every Other Day/30 Days Discharge Instructions: Cleanse the wound with wound cleanser prior to applying a clean dressing using gauze sponges, not tissue or cotton balls. Prim Dressing: KerraCel Ag Gelling Fiber Dressing, 4x5 in (silver alginate) Every Other Day/30 Days ary Discharge Instructions: Apply silver alginate to wound bed as instructed Secondary Dressing: Woven Gauze Sponge, Non-Sterile 4x4 in Every Other Day/30 Days Discharge Instructions: Apply over primary dressing as directed. Secondary Dressing: ABD Pad, 5x9 Every  Other Day/30 Days Discharge Instructions: Apply over primary dressing as directed. Secured With: [employer], 4.5x3.1 (in/yd) Every Other Day/30 Days Discharge Instructions: Secure with Kerlix as directed. Secured With: 32M Medipore Public affairs consultant Surgical T 2x10 (in/yd) Every Other Day/30 Days ape Discharge Instructions: Secure with tape as directed. WOUND #12: - Foot Wound Laterality: Plantar, Right Cleanser: Normal Saline Every Other Day/30 Days Discharge Instructions: Cleanse the wound with Normal Saline prior to applying a clean dressing using gauze sponges, not tissue or cotton balls. Cleanser: Wound Cleanser Every Other Day/30 Days Discharge Instructions: Cleanse the wound with wound cleanser prior to applying a clean dressing using gauze sponges, not tissue or cotton balls. Prim Dressing: KerraCel Ag Gelling Fiber Dressing, 4x5 in (silver alginate) Every Other Day/30 Days ary Discharge Instructions: Apply silver alginate to wound bed as instructed Secondary Dressing: Woven Gauze Sponge, Non-Sterile 4x4 in Every Other Day/30 Days Discharge Instructions: Apply over primary dressing as directed. Secondary Dressing: ABD Pad, 5x9 Every Other Day/30 Days Discharge Instructions: Apply over primary dressing as directed. Secured With: [employer], 4.5x3.1 (in/yd) Every Other Day/30 Days Discharge Instructions: Secure with Kerlix as directed. Secured With: 32M [employer] Public affairs consultant Surgical T 2x10 (in/yd) Every Other Day/30 Days ape Discharge Instructions: Secure with tape as directed. WOUND #15: - Foot Wound Laterality: Right, Lateral Cleanser: Normal Saline Every Other Day/30 Days Discharge Instructions: Cleanse the wound with Normal Saline prior to applying a clean dressing using gauze sponges, not tissue or cotton balls. Cleanser: Wound Cleanser Every Other Day/30 Days Discharge Instructions: Cleanse the wound with wound cleanser prior to applying a clean dressing using gauze  sponges, not tissue or cotton balls. Prim Dressing: KerraCel Ag Gelling Fiber Dressing, 4x5 in (silver alginate) Every Other Day/30 Days ary Discharge Instructions: Apply silver alginate to wound bed as instructed Secondary Dressing: Woven Gauze Sponge, Non-Sterile 4x4 in Every Other Day/30 Days Discharge Instructions: Apply over primary dressing as directed. Secondary Dressing: ABD Pad, 5x9 Every Other Day/30 Days Discharge Instructions: Apply over primary dressing as directed. Secured With: [employer], 4.5x3.1 (in/yd) Every Other Day/30 Days Discharge Instructions: Secure with Kerlix as directed. Secured With: 32M [employer] Public affairs consultant Surgical T 2x10 (in/yd) Every Other Day/30 Days ape Discharge Instructions: Secure with tape as directed. 01/03/2022: there are now only 2 wounds on the right foot. 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 Granulation tissue was appreciated beneath this. On the right fifth metatarsal head plantar surface, the wound is covered with heavy eschar. Once this was removed, a deep pocket was identified, but no purulent drainage was present. On  the left, eschar and callus were debrided from around the perimeter of the large plantar ulcer. Despite being absent from clinic for many weeks, there has been no significant deterioration in the wounds and 2 on the right foot have healed. We will continue silver alginate dressings to all sites. She should return to clinic in 1 week. Electronic Signature(s) Signed: 01/03/2022 5:25:03 PM By: Fredirick Maudlin MD FACS Entered By: Fredirick Maudlin on 01/03/2022 17:25:02 -------------------------------------------------------------------------------- HxROS Details Patient Name: Date of Service: [name], [name] 01/03/2022 3:45 PM Medical Record Number: [id number] Patient Account Number: [id number] Date of Birth/Sex: Treating  RN: 26-Jun-1956 (67 y.o. F) Primary Care Provider: [name] Other Clinician: Referring Provider: Treating Provider/Extender: Maxwell Marion in Treatment: 5 Information Obtained From Patient Constitutional Symptoms (General Health) Medical History: Past Medical History Notes: morbid obesity, fibromyalgia Eyes Medical History: Positive for: Cataracts - left eye removed, mild right eye Negative for: Glaucoma; Optic Neuritis Past Medical History Notes: macular degeneration left eye Ear/Nose/Mouth/Throat Medical History: Negative for: Chronic sinus problems/congestion; Middle ear problems Past Medical History Notes: seasonal allergies Hematologic/Lymphatic Medical History: Positive for: Anemia Negative for: Hemophilia; Human Immunodeficiency Virus; Lymphedema; Sickle Cell Disease Respiratory Medical History: Positive for: Sleep Apnea Negative for: Aspiration; Asthma; Chronic Obstructive Pulmonary Disease (COPD); Pneumothorax; Tuberculosis Cardiovascular Medical History: Positive for: Coronary Artery Disease; Hypertension; Myocardial Infarction - 2014 Past Medical History Notes: hyperlipidemia Gastrointestinal Medical History: Negative for: Cirrhosis ; Colitis; Crohns; Hepatitis A; Hepatitis B; Hepatitis C Past Medical History Notes: fatty liver Endocrine Medical History: Positive for: Type II Diabetes Negative for: Type I Diabetes Time with diabetes: 10 yrs Treated with: Insulin Blood sugar tested every day: Yes Tested : 2-3 times a week Blood sugar testing results: Breakfast: 100; Lunch: 120 Genitourinary Medical History: Past Medical History Notes: CKD stage 3 Immunological Medical History: Negative for: Lupus Erythematosus; Raynauds; Scleroderma Integumentary (Skin) Medical History: Negative for: History of Burn Musculoskeletal Medical History: Positive for: Osteoarthritis; Osteomyelitis - left and right foot Negative  for: Gout; Rheumatoid Arthritis Neurologic Medical History: Positive for: Neuropathy Negative for: Dementia; Quadriplegia; Paraplegia; Seizure Disorder Past Medical History Notes: cervical ruptured disc Oncologic Medical History: Negative for: Received Chemotherapy; Received Radiation Psychiatric Medical History: Negative for: Anorexia/bulimia; Confinement Anxiety HBO Extended History Items Eyes: Cataracts Immunizations Pneumococcal Vaccine: Received Pneumococcal Vaccination: No Implantable Devices None Hospitalization / Surgery History Type of Hospitalization/Surgery left heel surgery hysterectomy cholecystectomy right elbow surgery x4 December 2021 Family and Social History Cancer: No; Diabetes: Yes - Maternal Grandparents,Paternal Grandparents,Father,Siblings; Heart Disease: Yes - Mother,Father,Siblings; Hereditary Spherocytosis: No; Hypertension: Yes - Father; Kidney Disease: No; Lung Disease: No; Seizures: No; Stroke: No; Thyroid Problems: No; Tuberculosis: No; Never smoker; Marital Status - Married; Alcohol Use: Never; Drug Use: No History; Caffeine Use: Moderate - tea; Financial Concerns: No; Food, Clothing or Shelter Needs: No; Support System Lacking: No; Transportation Concerns: No Physician Affirmation I have reviewed and agree with the above information. Electronic Signature(s) Signed: 01/03/2022 5:26:37 PM By: Fredirick Maudlin MD FACS Entered By: Fredirick Maudlin on 01/03/2022 17:21:22 -------------------------------------------------------------------------------- SuperBill Details Patient Name: Date of Service: [name], [name] 01/03/2022 Medical Record Number: [id number] Patient Account Number: [id number] Date of Birth/Sex: Treating RN: June 10, 1956 (66 y.o. F) Primary Care Provider: [name] Other Clinician: Referring Provider: Treating Provider/Extender: Maxwell Marion in Treatment: 5 Diagnosis Coding ICD-10  Codes Code Description [phone] Type 2 diabetes mellitus with foot ulcer L97.514 Non-pressure chronic ulcer of other part of right foot with  necrosis of bone L97.524 Non-pressure chronic ulcer of other part of left foot with necrosis of bone M86.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, right ankle and foot M86.672 Other chronic osteomyelitis, left ankle and foot Facility Procedures CPT4 Code: [id number] Description: 59276 - DEB SUBQ TISSUE 20 SQ CM/< ICD-10 Diagnosis Description E11.621 Type 2 diabetes mellitus with foot ulcer L97.514 Non-pressure chronic ulcer of other part of right foot with necrosis of bone Modifier: Quantity: 1 CPT4 Code: 39432003 Description: 79444 - DEBRIDE WOUND 1ST 20 SQ CM OR < ICD-10 Diagnosis Description E11.621 Type 2 diabetes mellitus with foot ulcer L97.524 Non-pressure chronic ulcer of other part of left foot with necrosis of bone Modifier: Quantity: 1 Physician Procedures : CPT4 Code Description Modifier [id number] 11042 - WC PHYS SUBQ TISS 20 SQ CM ICD-10 Diagnosis Description E11.621 Type 2 diabetes mellitus with foot ulcer L97.514 Non-pressure chronic ulcer of other part of right foot with necrosis of bone Quantity: 1 : [id number] 31427 - WC PHYS DEBR WO ANESTH 20 SQ CM ICD-10 Diagnosis Description E11.621 Type 2 diabetes mellitus with foot ulcer L97.524 Non-pressure chronic ulcer of other part of left foot with necrosis of bone Quantity: 1 Electronic Signature(s) Signed: 01/03/2022 5:25:37 PM By: Fredirick Maudlin MD FACS Entered By: Fredirick Maudlin on 01/03/2022 17:25:36

## 2022-01-05 ENCOUNTER — Other Ambulatory Visit: Payer: Self-pay | Admitting: Family Medicine

## 2022-01-05 DIAGNOSIS — G894 Chronic pain syndrome: Secondary | ICD-10-CM

## 2022-01-05 DIAGNOSIS — Z981 Arthrodesis status: Secondary | ICD-10-CM

## 2022-01-05 DIAGNOSIS — M8610 Other acute osteomyelitis, unspecified site: Secondary | ICD-10-CM

## 2022-01-05 MED ORDER — HYDROCODONE-ACETAMINOPHEN 5-325 MG PO TABS: 1.0000 | ORAL_TABLET | Freq: Four times a day (QID) | ORAL | 0 refills | Status: DC | PRN

## 2022-01-05 NOTE — Telephone Encounter (Signed)
Requesting: NORCO ?Contract: 2019 ?UDS: 2019 ?Last OV: 12/19/21 ?Next OV: N/A ?Last Refill: 12/09/2021, #120--0 RF ?Database: ? ? ?Please advise  ? ?

## 2022-01-05 NOTE — Telephone Encounter (Signed)
Medication:  ?HYDROcodone-acetaminophen (NORCO/VICODIN) 5-325 MG tablet [[id number]]  ?  ? ?Has the patient contacted their pharmacy? No. ?(If no, request that the patient contact the pharmacy for the refill.) ?(If yes, when and what did the pharmacy advise?) ? ?  ? ?Preferred Pharmacy (with phone number or street name):  ?[place] [place]  ?[address], [place] [address]  ?Phone:  [phone]  Fax:  [phone]  ?  ? ?Agent: Please be advised that RX refills may take up to 3 business days. We ask that you follow-up with your pharmacy. ? ?

## 2022-01-06 ENCOUNTER — Encounter (HOSPITAL_BASED_OUTPATIENT_CLINIC_OR_DEPARTMENT_OTHER): Payer: Self-pay

## 2022-01-06 ENCOUNTER — Other Ambulatory Visit: Payer: Self-pay

## 2022-01-06 ENCOUNTER — Telehealth: Payer: Self-pay | Admitting: Family Medicine

## 2022-01-06 ENCOUNTER — Inpatient Hospital Stay (HOSPITAL_BASED_OUTPATIENT_CLINIC_OR_DEPARTMENT_OTHER)
Admission: EM | Admit: 2022-01-06 | Discharge: 2022-01-09 | DRG: 291 | Disposition: A | Discharge status: Home / Self Care | Payer: PRIVATE HEALTH INSURANCE | Attending: Internal Medicine | Admitting: Internal Medicine

## 2022-01-06 DIAGNOSIS — F419 Anxiety disorder, unspecified: Secondary | ICD-10-CM | POA: Diagnosis present

## 2022-01-06 DIAGNOSIS — D649 Anemia, unspecified: Secondary | ICD-10-CM | POA: Diagnosis present

## 2022-01-06 DIAGNOSIS — Z841 Family history of disorders of kidney and ureter: Secondary | ICD-10-CM

## 2022-01-06 DIAGNOSIS — I2510 Atherosclerotic heart disease of native coronary artery without angina pectoris: Secondary | ICD-10-CM | POA: Diagnosis present

## 2022-01-06 DIAGNOSIS — E1165 Type 2 diabetes mellitus with hyperglycemia: Secondary | ICD-10-CM

## 2022-01-06 DIAGNOSIS — Z91030 Bee allergy status: Secondary | ICD-10-CM

## 2022-01-06 DIAGNOSIS — D631 Anemia in chronic kidney disease: Secondary | ICD-10-CM | POA: Diagnosis present

## 2022-01-06 DIAGNOSIS — I252 Old myocardial infarction: Secondary | ICD-10-CM

## 2022-01-06 DIAGNOSIS — G4730 Sleep apnea, unspecified: Secondary | ICD-10-CM | POA: Diagnosis present

## 2022-01-06 DIAGNOSIS — Z794 Long term (current) use of insulin: Secondary | ICD-10-CM

## 2022-01-06 DIAGNOSIS — L97529 Non-pressure chronic ulcer of other part of left foot with unspecified severity: Secondary | ICD-10-CM | POA: Diagnosis present

## 2022-01-06 DIAGNOSIS — Z79899 Other long term (current) drug therapy: Secondary | ICD-10-CM

## 2022-01-06 DIAGNOSIS — Z792 Long term (current) use of antibiotics: Secondary | ICD-10-CM

## 2022-01-06 DIAGNOSIS — Z7984 Long term (current) use of oral hypoglycemic drugs: Secondary | ICD-10-CM

## 2022-01-06 DIAGNOSIS — Z8249 Family history of ischemic heart disease and other diseases of the circulatory system: Secondary | ICD-10-CM

## 2022-01-06 DIAGNOSIS — K760 Fatty (change of) liver, not elsewhere classified: Secondary | ICD-10-CM | POA: Diagnosis present

## 2022-01-06 DIAGNOSIS — Z8616 Personal history of COVID-19: Secondary | ICD-10-CM

## 2022-01-06 DIAGNOSIS — M19042 Primary osteoarthritis, left hand: Secondary | ICD-10-CM | POA: Diagnosis present

## 2022-01-06 DIAGNOSIS — M19041 Primary osteoarthritis, right hand: Secondary | ICD-10-CM | POA: Diagnosis present

## 2022-01-06 DIAGNOSIS — Z9109 Other allergy status, other than to drugs and biological substances: Secondary | ICD-10-CM

## 2022-01-06 DIAGNOSIS — K219 Gastro-esophageal reflux disease without esophagitis: Secondary | ICD-10-CM | POA: Diagnosis present

## 2022-01-06 DIAGNOSIS — I77810 Thoracic aortic ectasia: Secondary | ICD-10-CM | POA: Diagnosis present

## 2022-01-06 DIAGNOSIS — R269 Unspecified abnormalities of gait and mobility: Secondary | ICD-10-CM | POA: Diagnosis present

## 2022-01-06 DIAGNOSIS — F32A Depression, unspecified: Secondary | ICD-10-CM

## 2022-01-06 DIAGNOSIS — E1122 Type 2 diabetes mellitus with diabetic chronic kidney disease: Secondary | ICD-10-CM | POA: Diagnosis present

## 2022-01-06 DIAGNOSIS — G894 Chronic pain syndrome: Secondary | ICD-10-CM

## 2022-01-06 DIAGNOSIS — M797 Fibromyalgia: Secondary | ICD-10-CM | POA: Diagnosis present

## 2022-01-06 DIAGNOSIS — K7460 Unspecified cirrhosis of liver: Secondary | ICD-10-CM | POA: Diagnosis present

## 2022-01-06 DIAGNOSIS — Z880 Allergy status to penicillin: Secondary | ICD-10-CM

## 2022-01-06 DIAGNOSIS — Z833 Family history of diabetes mellitus: Secondary | ICD-10-CM

## 2022-01-06 DIAGNOSIS — I130 Hypertensive heart and chronic kidney disease with heart failure and stage 1 through stage 4 chronic kidney disease, or unspecified chronic kidney disease: Secondary | ICD-10-CM | POA: Diagnosis not present

## 2022-01-06 DIAGNOSIS — E785 Hyperlipidemia, unspecified: Secondary | ICD-10-CM | POA: Diagnosis present

## 2022-01-06 DIAGNOSIS — Z886 Allergy status to analgesic agent status: Secondary | ICD-10-CM

## 2022-01-06 DIAGNOSIS — E7800 Pure hypercholesterolemia, unspecified: Secondary | ICD-10-CM | POA: Diagnosis present

## 2022-01-06 DIAGNOSIS — R7989 Other specified abnormal findings of blood chemistry: Secondary | ICD-10-CM | POA: Diagnosis present

## 2022-01-06 DIAGNOSIS — L03116 Cellulitis of left lower limb: Secondary | ICD-10-CM | POA: Diagnosis present

## 2022-01-06 DIAGNOSIS — I10 Essential (primary) hypertension: Secondary | ICD-10-CM | POA: Diagnosis present

## 2022-01-06 DIAGNOSIS — F4310 Post-traumatic stress disorder, unspecified: Secondary | ICD-10-CM | POA: Diagnosis present

## 2022-01-06 DIAGNOSIS — Z7901 Long term (current) use of anticoagulants: Secondary | ICD-10-CM

## 2022-01-06 DIAGNOSIS — Z91018 Allergy to other foods: Secondary | ICD-10-CM

## 2022-01-06 DIAGNOSIS — M160 Bilateral primary osteoarthritis of hip: Secondary | ICD-10-CM | POA: Diagnosis present

## 2022-01-06 DIAGNOSIS — L97519 Non-pressure chronic ulcer of other part of right foot with unspecified severity: Secondary | ICD-10-CM | POA: Diagnosis present

## 2022-01-06 DIAGNOSIS — E1142 Type 2 diabetes mellitus with diabetic polyneuropathy: Secondary | ICD-10-CM | POA: Diagnosis present

## 2022-01-06 DIAGNOSIS — E11319 Type 2 diabetes mellitus with unspecified diabetic retinopathy without macular edema: Secondary | ICD-10-CM | POA: Diagnosis present

## 2022-01-06 DIAGNOSIS — E11621 Type 2 diabetes mellitus with foot ulcer: Secondary | ICD-10-CM | POA: Diagnosis present

## 2022-01-06 DIAGNOSIS — N184 Chronic kidney disease, stage 4 (severe): Secondary | ICD-10-CM | POA: Diagnosis present

## 2022-01-06 DIAGNOSIS — I5033 Acute on chronic diastolic (congestive) heart failure: Secondary | ICD-10-CM | POA: Diagnosis present

## 2022-01-06 DIAGNOSIS — L089 Local infection of the skin and subcutaneous tissue, unspecified: Secondary | ICD-10-CM

## 2022-01-06 DIAGNOSIS — Z940 Kidney transplant status: Secondary | ICD-10-CM

## 2022-01-06 DIAGNOSIS — I82503 Chronic embolism and thrombosis of unspecified deep veins of lower extremity, bilateral: Secondary | ICD-10-CM | POA: Diagnosis present

## 2022-01-06 DIAGNOSIS — Z818 Family history of other mental and behavioral disorders: Secondary | ICD-10-CM

## 2022-01-06 DIAGNOSIS — Z90710 Acquired absence of both cervix and uterus: Secondary | ICD-10-CM

## 2022-01-06 DIAGNOSIS — R0602 Shortness of breath: Secondary | ICD-10-CM | POA: Diagnosis not present

## 2022-01-06 DIAGNOSIS — Z91013 Allergy to seafood: Secondary | ICD-10-CM

## 2022-01-06 DIAGNOSIS — R9431 Abnormal electrocardiogram [ECG] [EKG]: Secondary | ICD-10-CM | POA: Diagnosis present

## 2022-01-06 DIAGNOSIS — H269 Unspecified cataract: Secondary | ICD-10-CM | POA: Diagnosis present

## 2022-01-06 DIAGNOSIS — J9621 Acute and chronic respiratory failure with hypoxia: Secondary | ICD-10-CM | POA: Diagnosis present

## 2022-01-06 DIAGNOSIS — J45909 Unspecified asthma, uncomplicated: Secondary | ICD-10-CM | POA: Diagnosis present

## 2022-01-06 DIAGNOSIS — L03115 Cellulitis of right lower limb: Secondary | ICD-10-CM | POA: Diagnosis present

## 2022-01-06 DIAGNOSIS — Z20822 Contact with and (suspected) exposure to covid-19: Secondary | ICD-10-CM | POA: Diagnosis present

## 2022-01-06 DIAGNOSIS — Z888 Allergy status to other drugs, medicaments and biological substances status: Secondary | ICD-10-CM

## 2022-01-06 DIAGNOSIS — L0390 Cellulitis, unspecified: Secondary | ICD-10-CM | POA: Diagnosis present

## 2022-01-06 MED ORDER — ALBUTEROL SULFATE (2.5 MG/3ML) 0.083% IN NEBU
2.5000 mg | INHALATION_SOLUTION | Freq: Once | RESPIRATORY_TRACT | Status: AC
  Administered 2022-01-06: 2.5 mg via RESPIRATORY_TRACT
  Filled 2022-01-06: qty 3

## 2022-01-06 MED ORDER — METRONIDAZOLE 500 MG PO TABS: 500.0000 mg | ORAL_TABLET | Freq: Three times a day (TID) | ORAL | 1 refills | Status: DC

## 2022-01-06 NOTE — Telephone Encounter (Signed)
Last filled by Dr. Megan Salon with infectious diease. Please advise ?

## 2022-01-06 NOTE — Telephone Encounter (Signed)
Medication:  ?metroNIDAZOLE (FLAGYL) 500 MG tablet [[id number]]  ?  ? ?Has the patient contacted their pharmacy? No. ?(If no, request that the patient contact the pharmacy for the refill.) ?(If yes, when and what did the pharmacy advise?) ? ?  ? ?Preferred Pharmacy (with phone number or street name):  ?[place] [place]  ?[address] [place] [address]  ?Phone:  [phone]  Fax:  [phone] ?  ? ?Agent: Please be advised that RX refills may take up to 3 business days. We ask that you follow-up with your pharmacy. ? ?

## 2022-01-06 NOTE — ED Triage Notes (Signed)
Patient presents with complaint of shortness of breath and upper airway wheezing.  She states she was eating and food "got stuck."  Patient complains of chest pain.  Patient also complains of headache.  Albuterol neb initiated in triage.  02 concentrator found to be off in lobby but Sp02 94%.  ?

## 2022-01-07 ENCOUNTER — Encounter (HOSPITAL_COMMUNITY): Payer: Self-pay | Admitting: Internal Medicine

## 2022-01-07 ENCOUNTER — Inpatient Hospital Stay (HOSPITAL_COMMUNITY): Payer: PRIVATE HEALTH INSURANCE

## 2022-01-07 ENCOUNTER — Emergency Department (HOSPITAL_BASED_OUTPATIENT_CLINIC_OR_DEPARTMENT_OTHER): Payer: PRIVATE HEALTH INSURANCE

## 2022-01-07 DIAGNOSIS — I77810 Thoracic aortic ectasia: Secondary | ICD-10-CM | POA: Diagnosis present

## 2022-01-07 DIAGNOSIS — F4310 Post-traumatic stress disorder, unspecified: Secondary | ICD-10-CM | POA: Diagnosis present

## 2022-01-07 DIAGNOSIS — I10 Essential (primary) hypertension: Secondary | ICD-10-CM

## 2022-01-07 DIAGNOSIS — I5033 Acute on chronic diastolic (congestive) heart failure: Secondary | ICD-10-CM

## 2022-01-07 DIAGNOSIS — I130 Hypertensive heart and chronic kidney disease with heart failure and stage 1 through stage 4 chronic kidney disease, or unspecified chronic kidney disease: Secondary | ICD-10-CM | POA: Diagnosis present

## 2022-01-07 DIAGNOSIS — E1165 Type 2 diabetes mellitus with hyperglycemia: Secondary | ICD-10-CM | POA: Diagnosis present

## 2022-01-07 DIAGNOSIS — Z20822 Contact with and (suspected) exposure to covid-19: Secondary | ICD-10-CM | POA: Diagnosis present

## 2022-01-07 DIAGNOSIS — L03115 Cellulitis of right lower limb: Secondary | ICD-10-CM | POA: Diagnosis present

## 2022-01-07 DIAGNOSIS — Z940 Kidney transplant status: Secondary | ICD-10-CM | POA: Diagnosis not present

## 2022-01-07 DIAGNOSIS — R0602 Shortness of breath: Secondary | ICD-10-CM | POA: Diagnosis present

## 2022-01-07 DIAGNOSIS — E1122 Type 2 diabetes mellitus with diabetic chronic kidney disease: Secondary | ICD-10-CM | POA: Diagnosis present

## 2022-01-07 DIAGNOSIS — L0390 Cellulitis, unspecified: Secondary | ICD-10-CM | POA: Diagnosis present

## 2022-01-07 DIAGNOSIS — E11319 Type 2 diabetes mellitus with unspecified diabetic retinopathy without macular edema: Secondary | ICD-10-CM | POA: Diagnosis present

## 2022-01-07 DIAGNOSIS — I82503 Chronic embolism and thrombosis of unspecified deep veins of lower extremity, bilateral: Secondary | ICD-10-CM | POA: Diagnosis present

## 2022-01-07 DIAGNOSIS — E11621 Type 2 diabetes mellitus with foot ulcer: Secondary | ICD-10-CM | POA: Diagnosis present

## 2022-01-07 DIAGNOSIS — L97519 Non-pressure chronic ulcer of other part of right foot with unspecified severity: Secondary | ICD-10-CM | POA: Diagnosis present

## 2022-01-07 DIAGNOSIS — I509 Heart failure, unspecified: Secondary | ICD-10-CM | POA: Insufficient documentation

## 2022-01-07 DIAGNOSIS — D649 Anemia, unspecified: Secondary | ICD-10-CM

## 2022-01-07 DIAGNOSIS — L03119 Cellulitis of unspecified part of limb: Secondary | ICD-10-CM

## 2022-01-07 DIAGNOSIS — N184 Chronic kidney disease, stage 4 (severe): Secondary | ICD-10-CM

## 2022-01-07 DIAGNOSIS — L97529 Non-pressure chronic ulcer of other part of left foot with unspecified severity: Secondary | ICD-10-CM | POA: Diagnosis present

## 2022-01-07 DIAGNOSIS — J9621 Acute and chronic respiratory failure with hypoxia: Secondary | ICD-10-CM | POA: Diagnosis present

## 2022-01-07 DIAGNOSIS — Z794 Long term (current) use of insulin: Secondary | ICD-10-CM

## 2022-01-07 DIAGNOSIS — R9431 Abnormal electrocardiogram [ECG] [EKG]: Secondary | ICD-10-CM | POA: Diagnosis present

## 2022-01-07 DIAGNOSIS — D631 Anemia in chronic kidney disease: Secondary | ICD-10-CM | POA: Diagnosis present

## 2022-01-07 DIAGNOSIS — K7460 Unspecified cirrhosis of liver: Secondary | ICD-10-CM | POA: Diagnosis present

## 2022-01-07 DIAGNOSIS — E7800 Pure hypercholesterolemia, unspecified: Secondary | ICD-10-CM | POA: Diagnosis present

## 2022-01-07 DIAGNOSIS — E785 Hyperlipidemia, unspecified: Secondary | ICD-10-CM

## 2022-01-07 DIAGNOSIS — I2510 Atherosclerotic heart disease of native coronary artery without angina pectoris: Secondary | ICD-10-CM | POA: Diagnosis present

## 2022-01-07 DIAGNOSIS — F32A Depression, unspecified: Secondary | ICD-10-CM | POA: Diagnosis present

## 2022-01-07 DIAGNOSIS — J45909 Unspecified asthma, uncomplicated: Secondary | ICD-10-CM | POA: Diagnosis present

## 2022-01-07 DIAGNOSIS — L03116 Cellulitis of left lower limb: Secondary | ICD-10-CM | POA: Diagnosis present

## 2022-01-07 DIAGNOSIS — Z8616 Personal history of COVID-19: Secondary | ICD-10-CM | POA: Diagnosis not present

## 2022-01-07 LAB — COMPREHENSIVE METABOLIC PANEL
ALT: 8 U/L (ref 0–44)
ALT: 8 U/L (ref 0–44)
AST: 16 U/L (ref 15–41)
AST: 19 U/L (ref 15–41)
Albumin: 2.7 g/dL — ABNORMAL LOW (ref 3.5–5.0)
Albumin: 3.1 g/dL — ABNORMAL LOW (ref 3.5–5.0)
Alkaline Phosphatase: 58 U/L (ref 38–126)
Alkaline Phosphatase: 68 U/L (ref 38–126)
Anion gap: 7 (ref 5–15)
Anion gap: 8 (ref 5–15)
BUN: 30 mg/dL — ABNORMAL HIGH (ref 8–23)
BUN: 32 mg/dL — ABNORMAL HIGH (ref 8–23)
CO2: 22 mmol/L (ref 22–32)
CO2: 24 mmol/L (ref 22–32)
Calcium: 8.4 mg/dL — ABNORMAL LOW (ref 8.9–10.3)
Calcium: 8.5 mg/dL — ABNORMAL LOW (ref 8.9–10.3)
Chloride: 108 mmol/L (ref 98–111)
Chloride: 109 mmol/L (ref 98–111)
Creatinine, Ser: 2.57 mg/dL — ABNORMAL HIGH (ref 0.44–1.00)
Creatinine, Ser: 2.59 mg/dL — ABNORMAL HIGH (ref 0.44–1.00)
GFR, Estimated: 20 mL/min — ABNORMAL LOW (ref >60)
GFR, Estimated: 20 mL/min — ABNORMAL LOW (ref >60)
Glucose, Bld: 118 mg/dL — ABNORMAL HIGH (ref 70–99)
Glucose, Bld: 130 mg/dL — ABNORMAL HIGH (ref 70–99)
Potassium: 4.3 mmol/L (ref 3.5–5.1)
Potassium: 4.4 mmol/L (ref 3.5–5.1)
Sodium: 139 mmol/L (ref 135–145)
Sodium: 139 mmol/L (ref 135–145)
Total Bilirubin: 0.5 mg/dL (ref 0.3–1.2)
Total Bilirubin: 0.6 mg/dL (ref 0.3–1.2)
Total Protein: 7.4 g/dL (ref 6.5–8.1)
Total Protein: 8.4 g/dL — ABNORMAL HIGH (ref 6.5–8.1)

## 2022-01-07 LAB — I-STAT VENOUS BLOOD GAS, ED
Acid-base deficit: 3 mmol/L — ABNORMAL HIGH (ref 0.0–2.0)
Bicarbonate: 24.2 mmol/L (ref 20.0–28.0)
Calcium, Ion: 1.24 mmol/L (ref 1.15–1.40)
HCT: 28 % — ABNORMAL LOW (ref 36.0–46.0)
Hemoglobin: 9.5 g/dL — ABNORMAL LOW (ref 12.0–15.0)
O2 Saturation: 30 %
Patient temperature: 98.2
Potassium: 4.2 mmol/L (ref 3.5–5.1)
Sodium: 142 mmol/L (ref 135–145)
TCO2: 26 mmol/L (ref 22–32)
pCO2, Ven: 50.3 mmHg (ref 44–60)
pH, Ven: 7.29 (ref 7.25–7.43)
pO2, Ven: 21 mmHg — CL (ref 32–45)

## 2022-01-07 LAB — RESP PANEL BY RT-PCR (FLU A&B, COVID) ARPGX2
Influenza A by PCR: NEGATIVE
Influenza B by PCR: NEGATIVE
SARS Coronavirus 2 by RT PCR: NEGATIVE

## 2022-01-07 LAB — BLOOD GAS, VENOUS
Acid-base deficit: 2.8 mmol/L — ABNORMAL HIGH (ref 0.0–2.0)
Bicarbonate: 23.2 mmol/L (ref 20.0–28.0)
O2 Saturation: 73.2 %
Patient temperature: 37
pCO2, Ven: 44 mmHg (ref 44–60)
pH, Ven: 7.33 (ref 7.25–7.43)
pO2, Ven: 41 mmHg (ref 32–45)

## 2022-01-07 LAB — LIPASE, BLOOD: Lipase: 53 U/L — ABNORMAL HIGH (ref 11–51)

## 2022-01-07 LAB — URINALYSIS, ROUTINE W REFLEX MICROSCOPIC
Bacteria, UA: NONE SEEN
Bilirubin Urine: NEGATIVE
Glucose, UA: NEGATIVE mg/dL
Ketones, ur: NEGATIVE mg/dL
Nitrite: NEGATIVE
Protein, ur: 100 mg/dL — AB
Specific Gravity, Urine: 1.011 (ref 1.005–1.030)
pH: 5 (ref 5.0–8.0)

## 2022-01-07 LAB — CBC WITH DIFFERENTIAL/PLATELET
Abs Immature Granulocytes: 0.02 10*3/uL (ref 0.00–0.07)
Abs Immature Granulocytes: 0.02 10*3/uL (ref 0.00–0.07)
Basophils Absolute: 0 10*3/uL (ref 0.0–0.1)
Basophils Absolute: 0.1 10*3/uL (ref 0.0–0.1)
Basophils Relative: 1 %
Basophils Relative: 1 %
Eosinophils Absolute: 0.1 10*3/uL (ref 0.0–0.5)
Eosinophils Absolute: 0.3 10*3/uL (ref 0.0–0.5)
Eosinophils Relative: 2 %
Eosinophils Relative: 5 %
HCT: 27 % — ABNORMAL LOW (ref 36.0–46.0)
HCT: 30.6 % — ABNORMAL LOW (ref 36.0–46.0)
Hemoglobin: 8.1 g/dL — ABNORMAL LOW (ref 12.0–15.0)
Hemoglobin: 9.2 g/dL — ABNORMAL LOW (ref 12.0–15.0)
Immature Granulocytes: 0 %
Immature Granulocytes: 0 %
Lymphocytes Relative: 10 %
Lymphocytes Relative: 12 %
Lymphs Abs: 0.6 10*3/uL — ABNORMAL LOW (ref 0.7–4.0)
Lymphs Abs: 0.7 10*3/uL (ref 0.7–4.0)
MCH: 28.4 pg (ref 26.0–34.0)
MCH: 28.7 pg (ref 26.0–34.0)
MCHC: 30 g/dL (ref 30.0–36.0)
MCHC: 30.1 g/dL (ref 30.0–36.0)
MCV: 94.7 fL (ref 80.0–100.0)
MCV: 95.3 fL (ref 80.0–100.0)
Monocytes Absolute: 0.4 10*3/uL (ref 0.1–1.0)
Monocytes Absolute: 0.4 10*3/uL (ref 0.1–1.0)
Monocytes Relative: 6 %
Monocytes Relative: 7 %
Neutro Abs: 4.2 10*3/uL (ref 1.7–7.7)
Neutro Abs: 5.1 10*3/uL (ref 1.7–7.7)
Neutrophils Relative %: 75 %
Neutrophils Relative %: 81 %
Platelets: 146 10*3/uL — ABNORMAL LOW (ref 150–400)
Platelets: 148 10*3/uL — ABNORMAL LOW (ref 150–400)
RBC: 2.85 MIL/uL — ABNORMAL LOW (ref 3.87–5.11)
RBC: 3.21 MIL/uL — ABNORMAL LOW (ref 3.87–5.11)
RDW: 17.1 % — ABNORMAL HIGH (ref 11.5–15.5)
RDW: 17.3 % — ABNORMAL HIGH (ref 11.5–15.5)
WBC: 5.6 10*3/uL (ref 4.0–10.5)
WBC: 6.2 10*3/uL (ref 4.0–10.5)
nRBC: 0 % (ref 0.0–0.2)
nRBC: 0 % (ref 0.0–0.2)

## 2022-01-07 LAB — GLUCOSE, CAPILLARY
Glucose-Capillary: 119 mg/dL — ABNORMAL HIGH (ref 70–99)
Glucose-Capillary: 96 mg/dL (ref 70–99)
Glucose-Capillary: 107 mg/dL — ABNORMAL HIGH (ref 70–99)

## 2022-01-07 LAB — TROPONIN I (HIGH SENSITIVITY)
Troponin I (High Sensitivity): 10 ng/L (ref <18)
Troponin I (High Sensitivity): 13 ng/L (ref <18)
Troponin I (High Sensitivity): 13 ng/L (ref <18)

## 2022-01-07 LAB — PROTIME-INR
INR: 1.3 — ABNORMAL HIGH (ref 0.8–1.2)
Prothrombin Time: 16.6 seconds — ABNORMAL HIGH (ref 11.4–15.2)

## 2022-01-07 LAB — ECHOCARDIOGRAM COMPLETE
AR max vel: 2.1 cm2
AV Area VTI: 2.45 cm2
AV Area mean vel: 2.19 cm2
AV Mean grad: 3 mmHg
AV Peak grad: 6.3 mmHg
Ao pk vel: 1.25 m/s
Area-P 1/2: 5.42 cm2
Height: 65 in
S' Lateral: 4.1 cm
Weight: 2704 oz

## 2022-01-07 LAB — BRAIN NATRIURETIC PEPTIDE: B Natriuretic Peptide: 1418.8 pg/mL — ABNORMAL HIGH (ref 0.0–100.0)

## 2022-01-07 LAB — RAPID URINE DRUG SCREEN, HOSP PERFORMED
Amphetamines: NOT DETECTED
Barbiturates: NOT DETECTED
Benzodiazepines: POSITIVE — AB
Cocaine: NOT DETECTED
Opiates: POSITIVE — AB
Tetrahydrocannabinol: NOT DETECTED

## 2022-01-07 LAB — MAGNESIUM: Magnesium: 1.5 mg/dL — ABNORMAL LOW (ref 1.7–2.4)

## 2022-01-07 LAB — PROCALCITONIN: Procalcitonin: <0.1 ng/mL

## 2022-01-07 LAB — PHOSPHORUS: Phosphorus: 4.9 mg/dL — ABNORMAL HIGH (ref 2.5–4.6)

## 2022-01-07 MED ORDER — CEFUROXIME AXETIL 250 MG PO TABS
250.0000 mg | ORAL_TABLET | Freq: Two times a day (BID) | ORAL | Status: DC
  Administered 2022-01-07 – 2022-01-09 (×5): 250 mg via ORAL
  Filled 2022-01-07 (×6): qty 1

## 2022-01-07 MED ORDER — ACETAMINOPHEN 325 MG PO TABS
650.0000 mg | ORAL_TABLET | Freq: Four times a day (QID) | ORAL | Status: DC | PRN
  Filled 2022-01-07: qty 2

## 2022-01-07 MED ORDER — ATORVASTATIN CALCIUM 10 MG PO TABS
10.0000 mg | ORAL_TABLET | Freq: Every day | ORAL | Status: DC
  Administered 2022-01-07 – 2022-01-09 (×3): 10 mg via ORAL
  Filled 2022-01-07 (×3): qty 1

## 2022-01-07 MED ORDER — INSULIN ASPART 100 UNIT/ML IJ SOLN: 0.0000 [IU] | Freq: Three times a day (TID) | INTRAMUSCULAR | Status: DC

## 2022-01-07 MED ORDER — FUROSEMIDE 10 MG/ML IJ SOLN
40.0000 mg | Freq: Once | INTRAMUSCULAR | Status: AC
Start: 2022-01-07 — End: 2022-01-07
  Administered 2022-01-07: 40 mg via INTRAVENOUS
  Filled 2022-01-07: qty 4

## 2022-01-07 MED ORDER — IPRATROPIUM-ALBUTEROL 0.5-2.5 (3) MG/3ML IN SOLN
3.0000 mL | Freq: Once | RESPIRATORY_TRACT | Status: AC
  Administered 2022-01-07: 3 mL via RESPIRATORY_TRACT
  Filled 2022-01-07: qty 3

## 2022-01-07 MED ORDER — NALOXONE HCL 0.4 MG/ML IJ SOLN: 0.4000 mg | INTRAMUSCULAR | Status: DC | PRN

## 2022-01-07 MED ORDER — DIAZEPAM 5 MG PO TABS
5.0000 mg | ORAL_TABLET | Freq: Once | ORAL | Status: AC
  Administered 2022-01-07: 5 mg via ORAL
  Filled 2022-01-07: qty 1

## 2022-01-07 MED ORDER — ACETAMINOPHEN 650 MG RE SUPP: 650.0000 mg | Freq: Four times a day (QID) | RECTAL | Status: DC | PRN

## 2022-01-07 MED ORDER — CARVEDILOL 6.25 MG PO TABS
6.2500 mg | ORAL_TABLET | Freq: Two times a day (BID) | ORAL | Status: DC
  Administered 2022-01-07 – 2022-01-09 (×5): 6.25 mg via ORAL
  Filled 2022-01-07 (×5): qty 1

## 2022-01-07 MED ORDER — FUROSEMIDE 10 MG/ML IJ SOLN
20.0000 mg | Freq: Once | INTRAMUSCULAR | Status: AC
  Administered 2022-01-07: 20 mg via INTRAVENOUS
  Filled 2022-01-07: qty 2

## 2022-01-07 MED ORDER — FERROUS SULFATE 325 (65 FE) MG PO TABS
325.0000 mg | ORAL_TABLET | Freq: Two times a day (BID) | ORAL | Status: DC
  Administered 2022-01-07 – 2022-01-09 (×5): 325 mg via ORAL
  Filled 2022-01-07 (×5): qty 1

## 2022-01-07 MED ORDER — FUROSEMIDE 10 MG/ML IJ SOLN
20.0000 mg | Freq: Two times a day (BID) | INTRAMUSCULAR | Status: DC
  Administered 2022-01-07 – 2022-01-09 (×5): 20 mg via INTRAVENOUS
  Filled 2022-01-07 (×5): qty 2

## 2022-01-07 MED ORDER — METRONIDAZOLE 500 MG PO TABS
500.0000 mg | ORAL_TABLET | Freq: Three times a day (TID) | ORAL | Status: DC
  Administered 2022-01-07 – 2022-01-09 (×7): 500 mg via ORAL
  Filled 2022-01-07 (×7): qty 1

## 2022-01-07 MED ORDER — MONTELUKAST SODIUM 10 MG PO TABS
10.0000 mg | ORAL_TABLET | Freq: Every day | ORAL | Status: DC
  Administered 2022-01-07 – 2022-01-08 (×2): 10 mg via ORAL
  Filled 2022-01-07 (×2): qty 1

## 2022-01-07 MED ORDER — APIXABAN 5 MG PO TABS
5.0000 mg | ORAL_TABLET | Freq: Two times a day (BID) | ORAL | Status: DC
  Administered 2022-01-07 – 2022-01-09 (×5): 5 mg via ORAL
  Filled 2022-01-07 (×5): qty 1

## 2022-01-07 MED ORDER — FENTANYL CITRATE PF 50 MCG/ML IJ SOSY
50.0000 ug | PREFILLED_SYRINGE | Freq: Once | INTRAMUSCULAR | Status: AC
  Administered 2022-01-07: 50 ug via INTRAVENOUS
  Filled 2022-01-07: qty 1

## 2022-01-07 NOTE — ED Notes (Signed)
Report called to Jasa with [facility]. ? ?

## 2022-01-07 NOTE — Assessment & Plan Note (Signed)
#)   QTc prolongation: Presenting EKG demonstrates QTc of 528 ms. outpatient medications that may be contributing to QTc prolongation: Cymbalta.  ?  ?  ?Plan: Monitor on telemetry.  Add-on serum magnesium level.  Repeat EKG in the morning to monitor interval degree of QTc prolongation.  Hold outpatient Cymbalta. ?  ?

## 2022-01-07 NOTE — Progress Notes (Signed)
Pt refusing BIPAP tonight. Informed pt to notify RN if she fills SOB. ?

## 2022-01-07 NOTE — ED Notes (Signed)
Patient has not voided yet following administration of IV furosemide.  Patient reminded urine specimen was needed.  Purewick remains in place.  ?

## 2022-01-07 NOTE — ED Notes (Signed)
Report given to floor nurse Corie Chiquito, RN. ?

## 2022-01-07 NOTE — Assessment & Plan Note (Signed)
#)   Anemia of chronic kidney disease: Documented history of such associated with baseline hemoglobin range of 7-9, with presenting hemoglobin consistent with this range, in the absence of any overt evidence of active bleed.  Of note, patient is also on daily oral iron supplementation as an outpatient.  Per chart review, it appears that she receives intermittent PRBC transfusions via her existing PICC line. ?  ?Plan: Repeat CBC in the morning.  Continue outpatient oral iron supplementation.  Check INR. ?  ?  ?

## 2022-01-07 NOTE — Assessment & Plan Note (Signed)
#)   Stage IV chronic kidney disease: Per chart review, she is status post renal transplant.  Baseline creatinine range appears to be 2.5-3.0, with presenting serum creatinine found to be consistent with his baseline range.  We will closely monitor ensuing renal function, particularly in response to plan for IV diuresis efforts in the context of presenting acute on chronic diastolic heart failure, as further detailed above. ?  ?Plan: Monitor strict I's and O's and daily weights.  Attempt avoid nephrotoxic agents.  Repeat BMP in the morning.  Check serum magnesium and phosphorus levels. ?

## 2022-01-07 NOTE — H&P (Signed)
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 INR.       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  Plan: Continue home Eliquis.         #) Type 2 Diabetes Mellitus: documented history of such. Home insulin regimen: Lantus 15 to 18 units subcu nightly.  Does not appear to be on short acting insulin as an outpatient. Home oral hypoglycemic agents: None. presenting blood sugar: 118.    Plan: accuchecks QAC and HS with low dose SSI.  Hold home basal insulin for now.        #) Depression: Documented history of such, on Cymbalta.  However, in the setting of presenting EKG showing evidence of QTc prolongation, will hold home Cymbalta for now.  Plan: Hold home Cymbalta for now.  Repeat EKG in the morning, with close monitoring of  interval degree of QTc prolongation.  Add on serum magnesium level.         #) Essential Hypertension: documented h/o such, with outpatient antihypertensive regimen including Coreg.  .   Plan: Close monitoring of subsequent BP via routine VS. continue home dose of Coreg for now.           #) Hyperlipidemia: documented h/o such. On atorvastatin as outpatient.    Plan: continue home statin.        #) Stage IV chronic kidney disease: Per chart review, she is status post renal transplant.  Baseline creatinine range appears to be 2.5-3.0, with presenting serum creatinine found to be consistent with his baseline range.  We will closely monitor ensuing renal function, particularly in response to plan for IV diuresis efforts in the context of presenting acute on chronic diastolic heart failure, as further detailed above.  Plan: Monitor strict I's and O's and daily weights.  Attempt avoid nephrotoxic agents.  Repeat BMP in the morning.  Check serum magnesium and phosphorus levels.         DVT prophylaxis: Continue outpatient Eliquis Code Status: Full code Family Communication: none Disposition Plan: Per Rounding Team Consults called: none;  Admission status: Inpatient   PLEASE NOTE THAT DRAGON DICTATION SOFTWARE WAS USED IN THE CONSTRUCTION OF THIS NOTE.   [place] DO [facility]  From [place]   01/07/2022, 5:52 AM

## 2022-01-07 NOTE — ED Provider Notes (Signed)
[facility] EMERGENCY DEPARTMENT Provider Note   CSN: [id number] Arrival date & time: 01/06/22  2312     History  Chief Complaint  Patient presents with   Shortness of Breath   Chest Pain   Foreign Body    throat    [name] [name] is a 66 y.o. adult.  The history is provided by the patient, the spouse and medical records.  Shortness of Breath Associated symptoms: chest pain   Chest Pain Associated symptoms: shortness of breath   Foreign Body [name] [name] is a 66 y.o. adult who presents to the Emergency Department complaining of sob.  She presents to the emergency department accompanied by her husband for evaluation of shortness of breath that has been present since Thursday.  She has a sensation that something is stuck in her throat and this started after she ate a bagel.  She is able to still swallow.  She has some central chest pain that started today, described as a pulling this sensation.  She also reports several days of epigastric pain.  No vomiting but does have nausea.  She has chronic lower extremity edema since December, feels this is no change from her baseline.  Has associated nonproductive cough.  She is currently on PICC line antibiotics for foot infection.  She is compliant with her home medications.  She is on 3 L nasal cannula oxygen at baseline.    Home Medications Prior to Admission medications   Medication Sig Start Date End Date Taking? Authorizing Provider  acetaminophen (TYLENOL) 500 MG tablet Take 1,000 mg by mouth every 6 (six) hours as needed for headache.    [provider]  apixaban (ELIQUIS) 5 MG TABS tablet Take 1 tablet (5 mg total) by mouth 2 (two) times daily. 12/19/21   Ann Held, DO  atorvastatin (LIPITOR) 10 MG tablet Take 1 tablet by mouth once daily 07/28/19   Carollee Herter, Alferd Apa, DO  carvedilol (COREG) 6.25 MG tablet Take 1 tablet (6.25 mg total) by mouth 2 (two) times daily with a meal. 12/19/21   Carollee Herter, Alferd Apa, DO  cefUROXime (CEFTIN) 250 MG tablet Take 1 tablet (250 mg total) by mouth 2 (two) times daily with a meal. 11/29/21   Michel Bickers, MD  Continuous Blood Gluc Receiver (FREESTYLE LIBRE 14 DAY READER) DEVI 1 Device by Does not apply route daily. 03/01/20   Copland, Gay Filler, MD  Continuous Blood Gluc Sensor (FREESTYLE LIBRE 14 DAY SENSOR) MISC 1 Device by Does not apply route daily. 03/01/20   Copland, Gay Filler, MD  diazepam (VALIUM) 5 MG tablet Take 1 tablet (5 mg total) by mouth every 12 (twelve) hours as needed for anxiety. 12/19/21   Ann Held, DO  DULoxetine (CYMBALTA) 60 MG capsule Take 1 capsule by mouth once daily Patient taking differently: Take 60 mg by mouth daily. 06/20/21   [name] R, DO  ferrous sulfate 325 (65 FE) MG tablet Take 1 tablet (325 mg total) by mouth 2 (two) times daily with a meal. 10/22/21   Thurnell Lose, MD  folic acid (FOLVITE) 1 MG tablet Take 2 tablets by mouth once daily 11/11/21   [name] Napoleon, MD  furosemide (LASIX) 20 MG tablet Take 1 tablet (20 mg total) by mouth daily. Patient taking differently: Take 40 mg by mouth daily. 10/07/21   Richardo Priest, MD  HYDROcodone-acetaminophen (NORCO/VICODIN) 5-325 MG tablet Take 1 tablet by mouth every 6 (  six) hours as needed for moderate pain. 01/05/22   [name] R, DO  insulin glargine (LANTUS SOLOSTAR) 100 UNIT/ML Solostar Pen INJECT 20 UNITS SUBCUTANEOUSLY IN THE MORNING AND 40 AT NIGHT Patient taking differently: 15-18 Units at bedtime. 05/18/21   Ann Held, DO  metroNIDAZOLE (FLAGYL) 500 MG tablet Take 1 tablet (500 mg total) by mouth 3 (three) times daily. 01/06/22   Michel Bickers, MD  montelukast (SINGULAIR) 10 MG tablet TAKE 1 TABLET BY MOUTH AT BEDTIME Patient taking differently: Take 10 mg by mouth at bedtime. 07/13/21   Ann Held, DO  Multiple Vitamin (MULTIVITAMIN WITH MINERALS) TABS tablet Take 1 tablet by mouth daily. 02/18/20    Angiulli, Lavon Paganini, PA-C  NONFORMULARY OR COMPOUNDED ITEM Please dispense 1 L border of normal saline, ABD pads, Kerlix roll, 30 gauze pieces, 1 roll of paper tape for twice a day following dressing change, gently clean lower extremity wounds with Normal Saline, pat dry. Cover with dry dressings (ABD pads) and secure with Kerlix roll gauze/paper tape 10/22/21   Thurnell Lose, MD  NONFORMULARY OR COMPOUNDED ITEM Power wheelchair  #1  as directed 12/19/21   Carollee Herter, Alferd Apa, DO  ondansetron (ZOFRAN) 8 MG tablet Take 0.5 tablets (4 mg total) by mouth every 8 (eight) hours as needed for nausea or vomiting. 10/22/21   Thurnell Lose, MD  pregabalin (LYRICA) 75 MG capsule TAKE 1 CAPSULE BY MOUTH THREE TIMES DAILY Patient taking differently: Take 75 mg by mouth 2 (two) times daily. 09/02/21   Ann Held, DO  proparacaine (ALCAINE) 0.5 % ophthalmic solution  11/09/21   [provider]  VITAMIN D PO Take 2 capsules by mouth daily.    [provider]  metFORMIN (GLUCOPHAGE) 1000 MG tablet Take 1,000 mg by mouth 2 (two) times daily with a meal.    12/19/21  [provider]  omeprazole (PRILOSEC) 20 MG capsule Take 20 mg by mouth daily.    12/19/21  [provider]      Allergies    Bee pollen, Fish-derived products, Mushroom extract complex, Penicillins, Rosemary oil, Shellfish allergy, Tomato, Acetaminophen, Aloe vera, Broccoli [brassica oleracea], Acyclovir and related, and Naproxen    Review of Systems   Review of Systems  Respiratory:  Positive for shortness of breath.   Cardiovascular:  Positive for chest pain.  All other systems reviewed and are negative.  Physical Exam Updated Vital Signs BP (!) 173/95    Pulse 86    Temp 98.2 F (36.8 C) (Axillary)    Resp 12    Ht 5' 5"  (1.651 m)    Wt 76.7 kg    SpO2 100%    BMI 28.12 kg/m  Physical Exam Vitals and nursing note reviewed.  Constitutional:      General: She is in acute distress.      Appearance: She is well-developed. She is ill-appearing.  HENT:     Head: Normocephalic and atraumatic.  Cardiovascular:     Rate and Rhythm: Normal rate and regular rhythm.     Heart sounds: No murmur heard. Pulmonary:     Effort: Respiratory distress present.     Comments: Decreased air movement bilaterally with wheezes bilaterally.  Dual lumen catheter in right upper chest wall with dressing C/D/I Abdominal:     Tenderness: There is abdominal tenderness. There is no guarding or rebound.     Comments: Mild distension, moderate epigastric tenderness  Musculoskeletal:  General: No tenderness.     Comments: 3+ pitting edema to BLE, left greater than right.  Dressing to bilateral feet.   Skin:    General: Skin is warm and dry.  Neurological:     Mental Status: She is alert and oriented to person, place, and time.  Psychiatric:     Comments: Anxious    ED Results / Procedures / Treatments   Labs (all labs ordered are listed, but only abnormal results are displayed) Labs Reviewed  COMPREHENSIVE METABOLIC PANEL - Abnormal; Notable for the following components:      Result Value   Glucose, Bld 118 (*)    BUN 32 (*)    Creatinine, Ser 2.57 (*)    Calcium 8.4 (*)    Total Protein 8.4 (*)    Albumin 3.1 (*)    GFR, Estimated 20 (*)    All other components within normal limits  LIPASE, BLOOD - Abnormal; Notable for the following components:   Lipase 53 (*)    All other components within normal limits  CBC WITH DIFFERENTIAL/PLATELET - Abnormal; Notable for the following components:   RBC 3.21 (*)    Hemoglobin 9.2 (*)    HCT 30.6 (*)    RDW 17.3 (*)    Platelets 146 (*)    All other components within normal limits  BRAIN NATRIURETIC PEPTIDE - Abnormal; Notable for the following components:   B Natriuretic Peptide 1,418.8 (*)    All other components within normal limits  I-STAT VENOUS BLOOD GAS, ED - Abnormal; Notable for the following components:   pO2, Ven 21 (*)     Acid-base deficit 3.0 (*)    HCT 28.0 (*)    Hemoglobin 9.5 (*)    All other components within normal limits  RESP PANEL BY RT-PCR (FLU A&B, COVID) ARPGX2  URINALYSIS, ROUTINE W REFLEX MICROSCOPIC  TROPONIN I (HIGH SENSITIVITY)  TROPONIN I (HIGH SENSITIVITY)    EKG EKG Interpretation  Date/Time:  Friday January 06 2022 23:26:30 EST Ventricular Rate:  97 PR Interval:  158 QRS Duration: 89 QT Interval:  415 QTC Calculation: 528 R Axis:   3 Text Interpretation: Sinus rhythm Low voltage, precordial leads Prolonged QT interval Confirmed by Quintella Reichert [phone]) on 01/07/2022 12:50:40 AM  Radiology DG Chest Port 1 View  Result Date: 01/07/2022 CLINICAL DATA:  Shortness of breath and chest pain. EXAM: PORTABLE CHEST 1 VIEW COMPARISON:  Chest radiograph dated 12/01/2021. FINDINGS: Right IJ central venous line with tip at the cavoatrial junction. There is cardiomegaly with vascular congestion and edema. Pneumonia is not excluded. Trace bilateral pleural effusions may be present. No pneumothorax. No acute osseous pathology. Old healed left clavicular and left shoulder fracture deformities as well as partially visualized spinal hardware. IMPRESSION: Cardiomegaly with vascular congestion and edema. Pneumonia is not excluded. Electronically Signed   By: Anner Crete M.D.   On: 01/07/2022 00:52    Procedures Procedures    Medications Ordered in ED Medications  albuterol (PROVENTIL) (2.5 MG/3ML) 0.083% nebulizer solution 2.5 mg (2.5 mg Nebulization Given 01/06/22 2325)  ipratropium-albuterol (DUONEB) 0.5-2.5 (3) MG/3ML nebulizer solution 3 mL (3 mLs Nebulization Given 01/07/22 0036)  fentaNYL (SUBLIMAZE) injection 50 mcg (50 mcg Intravenous Given 01/07/22 0044)  furosemide (LASIX) injection 40 mg (40 mg Intravenous Given 01/07/22 0138)    ED Course/ Medical Decision Making/ A&P  Medical Decision Making Amount and/or Complexity of Data Reviewed Labs:  ordered. Radiology: ordered.  Risk Prescription drug management. Decision regarding hospitalization.   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  Patient acutely ill-appearing on evaluation with increased work of breathing.  She also appears to have a component of anxiety as well.  Patient with wheezing on examination, no stridor or evidence of airway obstruction.  Chest x-ray is consistent with pulmonary vascular congestion, images personally reviewed.  She does appear volume overloaded on evaluation.  She was treated with DuoNebs with no significant change in her symptoms.  She was placed on BiPAP for comfort and treated with fentanyl for pain.  On reassessment she appears to be significantly more improved.  She does have some ongoing anxiety.  Labs with stable renal function.  BNP is increased compared to priors.  CBC with stable anemia.  Current clinical picture is not consistent with pneumonia.  Given severe symptoms recommend admission for ongoing treatment.  Patient is in agreement with treatment plan.  Medicine consulted for admission.        Final Clinical Impression(s) / ED Diagnoses Final diagnoses:  None    Rx / DC Orders ED Discharge Orders     None         Quintella Reichert, MD 01/07/22 [phone]

## 2022-01-07 NOTE — Assessment & Plan Note (Signed)
#)   Acute on chronic diastolic heart failure: dx of acute decompensation on the basis of presenting shortness of breath associated with orthopnea and worsening of edema in the bilateral lower extremities, with increased work of breathing, elevated BNP, and presenting chest x-ray showing evidence of pulmonary edema. This is in the context of a known history of chronic diastolic heart failure, with most recent echocardiogram performed in December 2022, notable for LVEF 55 to 60%, which was preceded by echocardiogram in January 2022, which was notable for grade 1 diastolic dysfunction. Etiology leading to presenting acutely decompensated heart failure is currently unclear. Patient conveys good compliance with home diuretic therapy, which consists of Lasix 40 mg p.o. daily, as well as good compliance with their additional home cardiac medications, which include Coreg. Overall, ACS leading to presenting acutely decompensated heart failure appears less likely at this time in the absence of any recent CP, presenting EKG showing no evidence of acute ischemic changes, as well as non-elevated troponin.  ?  ?Of note, patient received a total of 60 mg of IV Lasix while in the ED today. Presentation warrants additional IV diuresis, as further detailed below, with close monitoring of ensuing renal function, electrolytes, and volume status, as further noted below, particular given the likely delicate fluid balance in the context of patient's history of stage IV chronic kidney disease. Will also closely monitor ensuing HR as an additional means to evaluate volume status and help guide subsequent diuresis decision-making. As patient is already on a BB at home, will plan to continue this. ?  ?In terms of decision-making regarding dose of IV Lasix: Her outpatient Lasix 40 mg p.o. daily is equivalent of Lasix 20 mg p.o. twice daily, or 10 mg IV twice daily.  Given concern for delicate fluid balance in the setting of stage IV CKD, will  conservatively initiate Lasix 20 mg IV twice daily, which will represent a effective twofold increase in dose relative to her outpatient oral Lasix regimen. ?  ?  ?Plan: monitor strict I's & O's and daily weights. Monitor on telemetry, including trend in HR in response to diuresis. Monitor continuous pulse oximetry. Repeat BMP in the morning, including for monitoring trend of potassium, bicarbonate, and renal function in response to interval diuresis efforts. Check serum magnesium level. Close monitoring of ensuing blood pressure response to diuresis efforts, including to help guide need for improvement in afterload reduction in order to optimize cardiac output. Lasix 20 mg IV twice daily, as above. Continue outpatient BB.  Check echocardiogram.  Continue to trend troponin.  Check blood gas, with potential for weaning from current BiPAP.  Check procalcitonin.  Check urinary drug screen. ?  ?  ?

## 2022-01-07 NOTE — Progress Notes (Signed)
PROGRESS NOTE    [name] [name]  QPR:[id number] DOB: June 18, 1956 DOA: 01/06/2022 PCP: Ann Held, DO   Brief Narrative:  [name] [name] is a 66 y.o. 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   Of note, the patient was hospitalized in December 9935 for a complicated course of cellulitis involving the lower extremities.  As management for this, she is on long-term cefuroxime as well as Flagyl, and follows with Dr. Megan Salon of infectious disease.  Per chart review, most recent Dr. Megan Salon note, which occurred on 12/20/2021, conveys that patient is on oral cefuroxime and Flagyl and that her PICC line is not for IV antibiotic administration, but rather for recurrent PRBC transfusion in the setting of anemia.  In that note, Dr. Megan Salon notes plan for continuation of oral cefuroxime and Flagyl.   Assessment & Plan:   Principal Problem:   Acute on chronic diastolic CHF (congestive heart failure) (HCC) Active Problems:   Hyperlipidemia   Essential hypertension   Normocytic anemia   Type 2 diabetes mellitus with hyperglycemia, with long-term current use of insulin (HCC)   CRF (chronic renal failure), stage 4 (severe) (HCC)   Prolonged QT interval   Cellulitis   Acute on chronic hypoxic respiratory failure in setting of acute heart failure exacerbation -Echocardiogram pending -Continue diuretics, follow I's and O's -Hold off on cardiology consult at this time given marked improvement  over the past 24 hours -Patient reports being chronically on oxygen after COVID infection last year, wears 3 L at home "as needed" but reports never being reevaluated for appropriateness of supplemental oxygen -Wean oxygen aggressively here, today on room air at rest patient was 92 to 94%  CKD4 -Baseline creatinine 2.5-3.0 -Continue to follow while undergoing diuresis as above, creatinine at baseline -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 -PT OT to follow along -she initially refused evaluation with PT or OT over "personal issues with PT" which we discussed was not pertinent to her current medical status -Patient's ambulation status complicated by chronic diabetic foot wounds bilaterally -currently requesting motorized wheelchair from PCP but indicates she is able to ambulate short distances  Chronic lower extremity diabetic foot ulcers  -Following outpatient infectious disease, chronically on cefuroxime/flagyl since at least December 2022 -ID does not feel these wounds will heal without surgical intervention but patient has been refusing surgical evaluation in the outpatient setting, will not consult podiatry given her previous refusal although will sideline wound care for further insight and recommendations.  Chronic anemia of chronic disease, stable   -Continue to follow   Bilateral DVT, chronic -Continue home Eliquis  Insulin-dependent diabetes type 2 -Continue sliding scale insulin, hypoglycemic protocol  Depression -Does not appear to be on current regimen  DVT prophylaxis:  Eliquis Code Status: Full Family Communication:  Husband at bedside  Status is: Inpatient  Dispo: The patient is from: Home              Anticipated d/c is to: To be determined              Anticipated d/c date is: 48 to 72 hours              Patient currently not medically stable for discharge  Consultants:  None  Procedures:  None  Antimicrobials:  Cefuroxime/Flagyl -chronic outpatient antibiotics will be continued in-house  Subjective: No acute issues or events overnight, patient continues to complain of orthopnea, dyspnea with exertion but otherwise denies chest pain headache fevers chills nausea vomiting diarrhea constipation  Objective: Vitals:   01/07/22 0330 01/07/22 0455 01/07/22 0713 01/07/22 0725  BP: (!) 164/78 (!) 175/97  (!) 153/73  Pulse: 74 88 84 79  Resp: 11 20 14 17   Temp:  97.9 F (36.6 C)  97.9 F (36.6 C)  TempSrc:  Axillary  Axillary  SpO2: 100% 96% 100% 100%  Weight:      Height:        Intake/Output Summary (Last 24 hours) at 01/07/2022 0745 Last data filed at 01/07/2022 0530 Gross per 24 hour  Intake --  Output 650 ml  Net -650 ml   Filed Weights   01/06/22 2332  Weight: 76.7 kg    Examination:  General:  Pleasantly resting in bed, No acute distress. Lungs: Bibasilar rales Heart:  Regular rate and rhythm.  Without murmurs, rubs, or gallops. Abdomen:  Soft, nontender, nondistended.  Without guarding or rebound. Skin:  Warm and dry noted bilateral foot wounds:         Data Reviewed: I have personally reviewed following labs and imaging studies  CBC: Recent Labs  Lab 01/06/22 2338 01/07/22 0049 01/07/22 0542  WBC 5.6  --  6.2  NEUTROABS 4.2  --  5.1  HGB 9.2* 9.5* 8.1*  HCT 30.6* 28.0* 27.0*  MCV 95.3  --  94.7  PLT 146*  --  953*   Basic Metabolic Panel: Recent Labs  Lab 01/06/22 2338 01/07/22 0049 01/07/22 0542  NA 139 142 139  K 4.3 4.2 4.4  CL 108  --  109  CO2 24  --  22  GLUCOSE 118*  --  130*  BUN 32*   --  30*  CREATININE 2.57*  --  2.59*  CALCIUM 8.4*  --  8.5*  MG  --   --  1.5*  PHOS  --   --  4.9*   GFR: Estimated Creatinine Clearance (by C-G formula based on SCr of 2.59 mg/dL (H)) Female: 22.2 mL/min (A) Female: 27.2 mL/min (A) Liver Function Tests: Recent Labs  Lab 01/06/22 2338 01/07/22 0542  AST 19 16  ALT 8 8  ALKPHOS 68 58  BILITOT 0.5 0.6  PROT 8.4* 7.4  ALBUMIN 3.1* 2.7*   Recent Labs  Lab 01/06/22 2338  LIPASE 53*   No results for input(s): AMMONIA in the last 168 hours. Coagulation Profile: Recent Labs  Lab 01/07/22 0542  INR 1.3*   Cardiac Enzymes: No results for input(s): CKTOTAL, CKMB, CKMBINDEX, TROPONINI in the last 168 hours. BNP (last 3 results) No results for input(s): PROBNP in the last 8760 hours. HbA1C: No results for input(s): HGBA1C in the last 72 hours. CBG: Recent Labs  Lab 01/07/22 0615  GLUCAP 119*   Lipid Profile: No results for input(s): CHOL, HDL, LDLCALC, TRIG, CHOLHDL, LDLDIRECT in  the last 72 hours. Thyroid Function Tests: No results for input(s): TSH, T4TOTAL, FREET4, T3FREE, THYROIDAB in the last 72 hours. Anemia Panel: No results for input(s): VITAMINB12, FOLATE, FERRITIN, TIBC, IRON, RETICCTPCT in the last 72 hours. Sepsis Labs: No results for input(s): PROCALCITON, LATICACIDVEN in the last 168 hours.  Recent Results (from the past 240 hour(s))  Resp Panel by RT-PCR (Flu A&B, Covid) Nasopharyngeal Swab     Status: None   Collection Time: 01/07/22 12:58 AM   Specimen: Nasopharyngeal Swab; Nasopharyngeal(NP) swabs in vial transport medium  Result Value Ref Range Status   SARS Coronavirus 2 by RT PCR NEGATIVE NEGATIVE Final    Comment: (NOTE) SARS-CoV-2 target nucleic acids are NOT DETECTED.  The SARS-CoV-2 RNA is generally detectable in upper respiratory specimens during the acute phase of infection. The lowest concentration of SARS-CoV-2 viral copies this assay can detect is 138 copies/mL. A negative result  does not preclude SARS-Cov-2 infection and should not be used as the sole basis for treatment or other patient management decisions. A negative result may occur with  improper specimen collection/handling, submission of specimen other than nasopharyngeal swab, presence of viral mutation(s) within the areas targeted by this assay, and inadequate number of viral copies(<138 copies/mL). A negative result must be combined with clinical observations, patient history, and epidemiological information. The expected result is Negative.  Fact Sheet for Patients:  EntrepreneurPulse.com.au  Fact Sheet for Healthcare Providers:  IncredibleEmployment.be  This test is no t yet approved or cleared by the [place] FDA and  has been authorized for detection and/or diagnosis of SARS-CoV-2 by FDA under an Emergency Use Authorization (EUA). This EUA will remain  in effect (meaning this test can be used) for the duration of the COVID-19 declaration under Section 564(b)(1) of the Act, 21 U.S.C.section 360bbb-3(b)(1), unless the authorization is terminated  or revoked sooner.       Influenza A by PCR NEGATIVE NEGATIVE Final   Influenza B by PCR NEGATIVE NEGATIVE Final    Comment: (NOTE) The Xpert Xpress SARS-CoV-2/FLU/RSV plus assay is intended as an aid in the diagnosis of influenza from Nasopharyngeal swab specimens and should not be used as a sole basis for treatment. Nasal washings and aspirates are unacceptable for Xpert Xpress SARS-CoV-2/FLU/RSV testing.  Fact Sheet for Patients: EntrepreneurPulse.com.au  Fact Sheet for Healthcare Providers: IncredibleEmployment.be  This test is not yet approved or cleared by the [place] FDA and has been authorized for detection and/or diagnosis of SARS-CoV-2 by FDA under an Emergency Use Authorization (EUA). This EUA will remain in effect (meaning this test can be used) for  the duration of the COVID-19 declaration under Section 564(b)(1) of the Act, 21 U.S.C. section 360bbb-3(b)(1), unless the authorization is terminated or revoked.  Performed at [facility], [address], [place], [place] [address]     Radiology Studies: DG Chest Reader 1 View  Result Date: 01/07/2022 CLINICAL DATA:  Shortness of breath and chest pain. EXAM: PORTABLE CHEST 1 VIEW COMPARISON:  Chest radiograph dated 12/01/2021. FINDINGS: Right IJ central venous line with tip at the cavoatrial junction. There is cardiomegaly with vascular congestion and edema. Pneumonia is not excluded. Trace bilateral pleural effusions may be present. No pneumothorax. No acute osseous pathology. Old healed left clavicular and left shoulder fracture deformities as well as partially visualized spinal hardware. IMPRESSION: Cardiomegaly with vascular congestion and edema. Pneumonia is not excluded. Electronically Signed   By: Anner Crete M.D.   On: 01/07/2022 00:52  Scheduled Meds:  apixaban  5 mg Oral BID   atorvastatin  10 mg Oral Daily   carvedilol  6.25 mg Oral BID WC   cefUROXime  250 mg Oral BID   ferrous sulfate  325 mg Oral BID WC   furosemide  20 mg Intravenous BID   insulin aspart  0-9 Units Subcutaneous TID WC   metroNIDAZOLE  500 mg Oral TID   montelukast  10 mg Oral QHS    LOS: 0 days   Time spent: 43mn  Zay Yeargan C Chenee Munns, DO [facility]  If 7PM-7AM, please contact night-coverage [email or web address]  01/07/2022, 7:45 AM

## 2022-01-07 NOTE — Progress Notes (Signed)
Echocardiogram ?2D Echocardiogram has been performed. ? ?Arlyss Gandy ?01/07/2022, 11:48 AM ?

## 2022-01-07 NOTE — Assessment & Plan Note (Signed)
#)   History of cellulitis of the lower extremity: patient was hospitalized in December 0722 for a complicated course of cellulitis involving the lower extremities.  As management for this, she is on long-term cefuroxime as well as Flagyl, and follows with Dr. Megan Salon of infectious disease.  Per chart review, most recent Dr. Megan Salon note, which occurred on 12/20/2021, conveys that patient is on oral cefuroxime and Flagyl and that her PICC line is not for IV antibiotic administration, but rather for recurrent PRBC transfusion in the setting of anemia.  In that note, Dr. Megan Salon notes plan for continuation of oral cefuroxime and Flagyl.  ?  ?Plan: Continue outpatient cefuroxime and Flagyl. ?  ?

## 2022-01-07 NOTE — Assessment & Plan Note (Signed)
#)   Type 2 Diabetes Mellitus: documented history of such. Home insulin regimen: Lantus 15 to 18 units subcu nightly.  Does not appear to be on short acting insulin as an outpatient. Home oral hypoglycemic agents: None. presenting blood sugar: 118.  ?  ?  ?Plan: accuchecks QAC and HS with low dose SSI.  Hold home basal insulin for now. ?  ?

## 2022-01-07 NOTE — Progress Notes (Signed)
Plan of Care Note for accepted transfer ? ? ?Patient: [name] [name] MRN: [id number]   DOA: 01/06/2022 ? ?Facility requesting transfer: [facility] ED ?Requesting Provider: Dr. Ralene Bathe ?Reason for transfer: Acute Respiratory Failure ?Facility course:  ? ?66 year old female with past medical history of chronic kidney disease stage IV, diabetes mellitus type 2, coronary artery disease, hypertension, fibromyalgia, diabetic polyneuropathy, cirrhosis with recent hospitalization 22/5672 for complicated cellulitis of the feet currently receiving long-term cefuroxime and metronidazole via PICC line (follows with Dr. Megan Salon in [place] [facility]) who presents to [place] emergency department with complaints of shortness of breath and difficulty with swallowing. ? ?Upon evaluation in the emergency department patient was found to be in respiratory distress.  Patient was found to be extremely tachypneic with significant wheezing on exam.  No evidence of stridor was present.  VBG was performed revealing some degree of hypercapnic respiratory failure with a P CO2 of 50.3 and pH of 7.29 therefore patient was placed on BiPAP therapy. ? ?BNP was found to be markedly elevated at 1418, up from 63-monthago.  Chest x-ray was performed revealing vascular congestion and pulmonary edema.  Patient was therefore administered 60 mg of intravenous Lasix. ? ?ER provider requesting hospitalization for acute hypercapnic respiratory failure secondary to volume overload, presumably cardiogenic in origin. ? ?Plan of care: ?The patient is [facility].  ? ? ?Author: ?GVernelle Emerald MD ?01/07/2022 ? ?Check [email or web address] for on-call coverage. ? ?Nursing staff, Please call [email or web address] on Amion as soon as patient's arrival, so appropriate admitting provider can evaluate the pt. ?

## 2022-01-07 NOTE — Assessment & Plan Note (Signed)
?#)   Essential Hypertension: documented h/o such, with outpatient antihypertensive regimen including Coreg.  .  ?  ?Plan: Close monitoring of subsequent BP via routine VS. continue home dose of Coreg for now. ?  ?  ?  ?

## 2022-01-07 NOTE — ED Notes (Signed)
Patient appears to be resting comfortably on BiPap machine.  ?

## 2022-01-07 NOTE — Assessment & Plan Note (Signed)
#)   Hyperlipidemia: documented h/o such. On atorvastatin as outpatient.  ?  ?  ?Plan: continue home statin.  ?  ?

## 2022-01-07 NOTE — Progress Notes (Signed)
[facility] from [facility] on bipap, Pt transferred to v60 tolerating moderately well at this time. ?

## 2022-01-07 NOTE — ED Notes (Signed)
[facility] to bedside to assume care of patient. ?

## 2022-01-08 DIAGNOSIS — I5033 Acute on chronic diastolic (congestive) heart failure: Secondary | ICD-10-CM | POA: Diagnosis not present

## 2022-01-08 LAB — GLUCOSE, CAPILLARY
Glucose-Capillary: 107 mg/dL — ABNORMAL HIGH (ref 70–99)
Glucose-Capillary: 112 mg/dL — ABNORMAL HIGH (ref 70–99)
Glucose-Capillary: 114 mg/dL — ABNORMAL HIGH (ref 70–99)
Glucose-Capillary: 137 mg/dL — ABNORMAL HIGH (ref 70–99)

## 2022-01-08 MED ORDER — PREGABALIN 75 MG PO CAPS
75.0000 mg | ORAL_CAPSULE | Freq: Two times a day (BID) | ORAL | Status: DC
  Administered 2022-01-08 – 2022-01-09 (×3): 75 mg via ORAL
  Filled 2022-01-08 (×3): qty 1

## 2022-01-08 MED ORDER — HYDROCODONE-ACETAMINOPHEN 5-325 MG PO TABS
1.0000 | ORAL_TABLET | Freq: Three times a day (TID) | ORAL | Status: DC | PRN
  Administered 2022-01-08 – 2022-01-09 (×2): 1 via ORAL
  Filled 2022-01-08 (×2): qty 1

## 2022-01-08 MED ORDER — DULOXETINE HCL 60 MG PO CPEP
60.0000 mg | ORAL_CAPSULE | Freq: Every day | ORAL | Status: DC
  Administered 2022-01-08 – 2022-01-09 (×2): 60 mg via ORAL
  Filled 2022-01-08 (×2): qty 1

## 2022-01-08 MED ORDER — DIAZEPAM 5 MG PO TABS
5.0000 mg | ORAL_TABLET | Freq: Two times a day (BID) | ORAL | Status: DC | PRN
  Administered 2022-01-08: 5 mg via ORAL
  Filled 2022-01-08: qty 1

## 2022-01-08 MED ORDER — CHLORHEXIDINE GLUCONATE CLOTH 2 % EX PADS
6.0000 | MEDICATED_PAD | Freq: Every day | CUTANEOUS | Status: DC
  Administered 2022-01-08 – 2022-01-09 (×2): 6 via TOPICAL

## 2022-01-08 NOTE — Progress Notes (Signed)
PROGRESS NOTE    Aiesha Leland  TKP:[id number] DOB: [birth year] DOA: 01/06/2022 PCP: Ann Held, DO   Brief Narrative:  Mckenzey Parcell is a 66 y.o. 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   Of note, the patient was hospitalized in December 5170 for a complicated course of cellulitis involving the lower extremities.  As management for this, she is on long-term cefuroxime as well as Flagyl, and follows with Dr. Megan Salon of infectious disease.  Per chart review, most recent Dr. Megan Salon note, which occurred on 12/20/2021, conveys that patient is on oral cefuroxime and Flagyl and that her PICC line is not for IV antibiotic administration, but rather for recurrent PRBC transfusion in the setting of anemia.  In that note, Dr. Megan Salon notes plan for continuation of oral cefuroxime and Flagyl.   Assessment & Plan:   Principal Problem:   Acute on chronic diastolic CHF (congestive heart failure) (HCC) Active Problems:   Hyperlipidemia   Essential hypertension   Normocytic anemia   Type 2 diabetes mellitus with hyperglycemia, with long-term current use of insulin (HCC)   CRF (chronic renal failure), stage 4 (severe) (HCC)   Prolonged QT interval   Cellulitis   Acute on chronic hypoxic respiratory failure in setting of acute heart failure exacerbation -Echocardiogram - EF 50-55% with grade 1 diastolic dysfunction; aortic root dilation, with severely elevated pulmonary artery systolic  pressures -Continue diuretics, follow I's and O's -Patient reports being chronically on oxygen after COVID infection last year, wears 3 L at home "as needed" but reports never being reevaluated for appropriateness of supplemental oxygen -Wean oxygen aggressively, currently on room air at rest 92%, follow ambulatory screening -Continues to refuse BiPAP  CKD4 -Baseline creatinine 2.5-3.0 -Continue to follow while undergoing diuresis as above, creatinine at baseline -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 -PT OT to follow along -she initially refused evaluation with PT or OT over "personal issues with PT" which we discussed was not pertinent to her current medical status -Patient's ambulation status complicated by chronic diabetic foot wounds bilaterally -currently requesting motorized wheelchair from PCP but indicates she is able to ambulate short distances  Chronic lower extremity diabetic foot ulcers  -Following outpatient infectious disease, chronically on cefuroxime/flagyl since at least December 2022 -ID does not feel these wounds will heal without surgical intervention but patient has been refusing surgical evaluation in the outpatient setting, will not consult podiatry given her previous refusal although will sideline wound care for further insight and recommendations.  Chronic anemia of chronic disease, stable   -Continue to follow   Bilateral DVT, chronic -Continue home Eliquis  Insulin-dependent diabetes type 2 -Continue sliding scale insulin, hypoglycemic  protocol  Depression -Does not appear to be on current regimen  DVT prophylaxis: Eliquis Code Status:  Full Family Communication: None present  Status is: Inpatient  Dispo: The patient is from: Home              Anticipated d/c is to: To be determined              Anticipated d/c date is: 24-48 hours              Patient currently not medically stable for discharge  Consultants:  None  Procedures:  None  Antimicrobials:  Cefuroxime/Flagyl -chronic outpatient antibiotics will be continued in-house  Subjective: No acute issues or events overnight, patient continues to complain of orthopnea, dyspnea with exertion but otherwise denies chest pain headache fevers chills nausea vomiting diarrhea constipation  Objective: Vitals:   01/07/22 1606 01/07/22 2105 01/07/22 2356 01/08/22 0409  BP: (!) 154/74 (!) 157/67 (!) 142/70 (!) 142/66  Pulse: 76 84 73 72  Resp: 18 20 18 20   Temp: 97.9 F (36.6 C) 97.9 F (36.6 C) 98 F (36.7 C) 97.8 F (36.6 C)  TempSrc: Oral Oral Oral Oral  SpO2: 98% 97% 97% 99%  Weight:      Height:        Intake/Output Summary (Last 24 hours) at 01/08/2022 0738 Last data filed at 01/07/2022 2015 Gross per 24 hour  Intake --  Output 1150 ml  Net -1150 ml    Filed Weights   01/06/22 2332  Weight: 76.7 kg    Examination:  General:  Pleasantly resting in bed, No acute distress. Lungs: Bibasilar rales Heart:  Regular rate and rhythm.  Without murmurs, rubs, or gallops. Abdomen:  Soft, nontender, nondistended.  Without guarding or rebound. Skin:  Warm and dry noted bilateral foot wounds:         Data Reviewed: I have personally reviewed following labs and imaging studies  CBC: Recent Labs  Lab 01/06/22 2338 01/07/22 0049 01/07/22 0542  WBC 5.6  --  6.2  NEUTROABS 4.2  --  5.1  HGB 9.2* 9.5* 8.1*  HCT 30.6* 28.0* 27.0*  MCV 95.3  --  94.7  PLT 146*  --  148*    Basic Metabolic Panel: Recent Labs  Lab 01/06/22 2338  01/07/22 0049 01/07/22 0542  NA 139 142 139  K 4.3 4.2 4.4  CL 108  --  109  CO2 24  --  22  GLUCOSE 118*  --  130*  BUN 32*  --  30*  CREATININE 2.57*  --  2.59*  CALCIUM 8.4*  --  8.5*  MG  --   --  1.5*  PHOS  --   --  4.9*    GFR: Estimated Creatinine Clearance (by C-G formula based on SCr of 2.59 mg/dL (H)) Female: 22.2 mL/min (A) Female: 27.2 mL/min (A) Liver Function Tests: Recent Labs  Lab 01/06/22 2338 01/07/22 0542  AST 19 16  ALT 8 8  ALKPHOS 68 58  BILITOT 0.5 0.6  PROT 8.4* 7.4  ALBUMIN 3.1* 2.7*    Recent Labs  Lab 01/06/22 2338  LIPASE 53*    No results for input(s): AMMONIA in the last 168 hours. Coagulation Profile: Recent Labs  Lab 01/07/22 0542  INR 1.3*    Cardiac Enzymes: No results for input(s): CKTOTAL, CKMB, CKMBINDEX, TROPONINI in the last 168 hours. BNP (last 3 results) No results for input(s): PROBNP in the last 8760 hours. HbA1C: No results for input(s): HGBA1C in the last 72 hours. CBG: Recent Labs  Lab 01/07/22 0615 01/07/22 1612 01/07/22 2108 01/08/22  0546  GLUCAP 119* 96 107* 107*    Lipid Profile: No results for input(s): CHOL, HDL, LDLCALC, TRIG, CHOLHDL, LDLDIRECT in the last 72 hours. Thyroid Function Tests: No results for input(s): TSH, T4TOTAL, FREET4, T3FREE, THYROIDAB in the last 72 hours. Anemia Panel: No results for input(s): VITAMINB12, FOLATE, FERRITIN, TIBC, IRON, RETICCTPCT in the last 72 hours. Sepsis Labs: Recent Labs  Lab 01/07/22 0542  PROCALCITON <0.10    Recent Results (from the past 240 hour(s))  Resp Panel by RT-PCR (Flu A&B, Covid) Nasopharyngeal Swab     Status: None   Collection Time: 01/07/22 12:58 AM   Specimen: Nasopharyngeal Swab; Nasopharyngeal(NP) swabs in vial transport medium  Result Value Ref Range Status   SARS Coronavirus 2 by RT PCR NEGATIVE NEGATIVE Final    Comment: (NOTE) SARS-CoV-2 target nucleic acids are NOT DETECTED.  The SARS-CoV-2 RNA is generally detectable  in upper respiratory specimens during the acute phase of infection. The lowest concentration of SARS-CoV-2 viral copies this assay can detect is 138 copies/mL. A negative result does not preclude SARS-Cov-2 infection and should not be used as the sole basis for treatment or other patient management decisions. A negative result may occur with  improper specimen collection/handling, submission of specimen other than nasopharyngeal swab, presence of viral mutation(s) within the areas targeted by this assay, and inadequate number of viral copies(<138 copies/mL). A negative result must be combined with clinical observations, patient history, and epidemiological information. The expected result is Negative.  Fact Sheet for Patients:  EntrepreneurPulse.com.au  Fact Sheet for Healthcare Providers:  IncredibleEmployment.be  This test is no t yet approved or cleared by the [place] FDA and  has been authorized for detection and/or diagnosis of SARS-CoV-2 by FDA under an Emergency Use Authorization (EUA). This EUA will remain  in effect (meaning this test can be used) for the duration of the COVID-19 declaration under Section 564(b)(1) of the Act, 21 U.S.C.section 360bbb-3(b)(1), unless the authorization is terminated  or revoked sooner.       Influenza A by PCR NEGATIVE NEGATIVE Final   Influenza B by PCR NEGATIVE NEGATIVE Final    Comment: (NOTE) The Xpert Xpress SARS-CoV-2/FLU/RSV plus assay is intended as an aid in the diagnosis of influenza from Nasopharyngeal swab specimens and should not be used as a sole basis for treatment. Nasal washings and aspirates are unacceptable for Xpert Xpress SARS-CoV-2/FLU/RSV testing.  Fact Sheet for Patients: EntrepreneurPulse.com.au  Fact Sheet for Healthcare Providers: IncredibleEmployment.be  This test is not yet approved or cleared by the [place] FDA and has  been authorized for detection and/or diagnosis of SARS-CoV-2 by FDA under an Emergency Use Authorization (EUA). This EUA will remain in effect (meaning this test can be used) for the duration of the COVID-19 declaration under Section 564(b)(1) of the Act, 21 U.S.C. section 360bbb-3(b)(1), unless the authorization is terminated or revoked.  Performed at [facility], [address], [place], [place] [address]      Radiology Studies: DG Chest [place] 1 View  Result Date: 01/07/2022 CLINICAL DATA:  Shortness of breath and chest pain. EXAM: PORTABLE CHEST 1 VIEW COMPARISON:  Chest radiograph dated 12/01/2021. FINDINGS: Right IJ central venous line with tip at the cavoatrial junction. There is cardiomegaly with vascular congestion and edema. Pneumonia is not excluded. Trace bilateral pleural effusions may be present. No pneumothorax. No acute osseous pathology. Old healed left clavicular and left shoulder fracture deformities as well as partially visualized spinal hardware. IMPRESSION: Cardiomegaly with  vascular congestion and edema. Pneumonia is not excluded. Electronically Signed   By: Anner Crete M.D.   On: 01/07/2022 00:52   ECHOCARDIOGRAM COMPLETE  Result Date: 01/07/2022    ECHOCARDIOGRAM REPORT   Patient Name:   [name] [name] Date of Exam: 01/07/2022 Medical Rec #:  [id number]         Height:       65.0 in Accession #:    [id number]        Weight:       169.0 lb Date of Birth:  07/25/1956         BSA:          1.841 m Patient Age:    27 years          BP:           175/97 mmHg Patient Gender: F                 HR:           84 bpm. Exam Location:  Inpatient Procedure: 2D Echo Indications:    CHF  History:        Patient has prior history of Echocardiogram examinations, most                 recent 10/18/2021. CAD and Previous Myocardial Infarction,                 Signs/Symptoms:Shortness of Breath; Risk Factors:Diabetes and                 Hypertension.  Sonographer:    Arlyss Gandy Referring Phys: [id number] [place]  1. Left ventricular ejection fraction, by estimation, is 50 to 55%. The left ventricle has low normal function. Left ventricular endocardial border not optimally defined to evaluate regional wall motion. There is mild concentric left ventricular hypertrophy. Left ventricular diastolic parameters are consistent with Grade I diastolic dysfunction (impaired relaxation). Elevated left atrial pressure.  2. Right ventricular systolic function is low normal. The right ventricular size is normal. There is severely elevated pulmonary artery systolic pressure. The estimated right ventricular systolic pressure is 46.5 mmHg.  3. Left atrial size was moderately dilated.  4. Moderate pleural effusion in the left lateral region.  5. The mitral valve is abnormal. The posterior mitral valve leaflet is restricted with resultant moderate, posteriorly directed mitral valve regurgitation.  6. The aortic valve is tricuspid. There is mild calcification of the aortic valve. There is mild thickening of the aortic valve. Aortic valve regurgitation is not visualized. Aortic valve sclerosis/calcification is present, without any evidence of aortic stenosis.  7. Aortic dilatation noted. There is borderline dilatation of the aortic root, measuring 37 mm.  8. The inferior vena cava is dilated in size with <50% respiratory variability, suggesting right atrial pressure of 15 mmHg. Comparison(s): Compared to prior TTE on 09/2021, the LVEF appears slightly less robust 50-55% (previously 55-60%), the MR is now moderate in the setting of elevated filling pressures, there is severe pulmonary HTN (previously moderate). FINDINGS  Left Ventricle: Left ventricular ejection fraction, by estimation, is 50 to 55%. The left ventricle has low normal function. Left ventricular endocardial border not optimally defined to evaluate regional wall motion. The left ventricular internal cavity  size was normal  in size. There is mild concentric left ventricular hypertrophy. Left ventricular diastolic parameters are consistent with Grade I diastolic dysfunction (impaired relaxation). Elevated left atrial pressure. Right Ventricle: The right ventricular size is normal.  No increase in right ventricular wall thickness. Right ventricular systolic function is low normal. There is severely elevated pulmonary artery systolic pressure. The tricuspid regurgitant velocity is 3.49 m/s, and with an assumed right atrial pressure of 15 mmHg, the estimated right ventricular systolic pressure is 01.6 mmHg. Left Atrium: Left atrial size was moderately dilated. Right Atrium: Right atrial size was normal in size. Pericardium: There is no evidence of pericardial effusion. Mitral Valve: The mitral valve is abnormal. There is mild thickening of the mitral valve leaflet(s). There is mild calcification of the mitral valve leaflet(s). Mild to moderate mitral annular calcification. The posterior mitral valve leaflet is restricted with resultant moderate, posteriorly directed mitral valve regurgitation. Tricuspid Valve: The tricuspid valve is normal in structure. Tricuspid valve regurgitation is mild. Aortic Valve: The aortic valve is tricuspid. There is mild calcification of the aortic valve. There is mild thickening of the aortic valve. Aortic valve regurgitation is not visualized. Aortic valve sclerosis/calcification is present, without any evidence of aortic stenosis. Aortic valve mean gradient measures 3.0 mmHg. Aortic valve peak gradient measures 6.2 mmHg. Aortic valve area, by VTI measures 2.45 cm. Pulmonic Valve: The pulmonic valve was normal in structure. Pulmonic valve regurgitation is trivial. Aorta: Aortic dilatation noted. There is borderline dilatation of the aortic root, measuring 37 mm. Venous: The inferior vena cava is dilated in size with less than 50% respiratory variability, suggesting right atrial pressure of 15 mmHg.  IAS/Shunts: The atrial septum is grossly normal. Additional Comments: There is a moderate pleural effusion in the left lateral region.  LEFT VENTRICLE PLAX 2D LVIDd:         4.90 cm   Diastology LVIDs:         4.10 cm   LV e' medial:    4.90 cm/s LV PW:         1.10 cm   LV E/e' medial:  24.3 LV IVS:        1.10 cm   LV e' lateral:   6.53 cm/s LVOT diam:     2.00 cm   LV E/e' lateral: 18.2 LV SV:         65 LV SV Index:   35 LVOT Area:     3.14 cm  RIGHT VENTRICLE             IVC RV Basal diam:  3.40 cm     IVC diam: 2.30 cm RV Mid diam:    3.40 cm RV S prime:     11.40 cm/s TAPSE (M-mode): 1.9 cm LEFT ATRIUM             Index        RIGHT ATRIUM           Index LA diam:        4.50 cm 2.44 cm/m   RA Area:     13.20 cm LA Vol (A2C):   74.9 ml 40.67 ml/m  RA Volume:   28.00 ml  15.21 ml/m LA Vol (A4C):   62.5 ml 33.94 ml/m LA Biplane Vol: 69.9 ml 37.96 ml/m  AORTIC VALVE                    PULMONIC VALVE AV Area (Vmax):    2.10 cm     PV Vmax:       0.82 m/s AV Area (Vmean):   2.19 cm     PV Peak grad:  2.7 mmHg AV Area (VTI):     2.45  cm AV Vmax:           125.00 cm/s AV Vmean:          85.300 cm/s AV VTI:            0.265 m AV Peak Grad:      6.2 mmHg AV Mean Grad:      3.0 mmHg LVOT Vmax:         83.60 cm/s LVOT Vmean:        59.500 cm/s LVOT VTI:          0.207 m LVOT/AV VTI ratio: 0.78  AORTA Ao Root diam: 2.90 cm Ao Asc diam:  3.00 cm MITRAL VALVE                TRICUSPID VALVE MV Area (PHT): 5.42 cm     TR Peak grad:   48.7 mmHg MV Decel Time: 140 msec     TR Vmax:        349.00 cm/s MV E velocity: 119.00 cm/s MV A velocity: 119.00 cm/s  SHUNTS MV E/A ratio:  1.00         Systemic VTI:  0.21 m                             Systemic Diam: 2.00 cm Gwyndolyn Kaufman MD Electronically signed by Gwyndolyn Kaufman MD Signature Date/Time: 01/07/2022/3:37:15 PM    Final     Scheduled Meds:  apixaban  5 mg Oral BID   atorvastatin  10 mg Oral Daily   carvedilol  6.25 mg Oral BID WC   cefUROXime  250 mg  Oral BID   ferrous sulfate  325 mg Oral BID WC   furosemide  20 mg Intravenous BID   insulin aspart  0-9 Units Subcutaneous TID WC   metroNIDAZOLE  500 mg Oral TID   montelukast  10 mg Oral QHS    LOS: 1 day   Time spent: 24mn  Francie Keeling C Ervine Witucki, DO [facility]  If 7PM-7AM, please contact night-coverage [email or web address]  01/08/2022, 7:38 AM

## 2022-01-09 DIAGNOSIS — I5033 Acute on chronic diastolic (congestive) heart failure: Secondary | ICD-10-CM | POA: Diagnosis not present

## 2022-01-09 LAB — BASIC METABOLIC PANEL
Anion gap: 7 (ref 5–15)
BUN: 31 mg/dL — ABNORMAL HIGH (ref 8–23)
CO2: 24 mmol/L (ref 22–32)
Calcium: 8.3 mg/dL — ABNORMAL LOW (ref 8.9–10.3)
Chloride: 108 mmol/L (ref 98–111)
Creatinine, Ser: 2.99 mg/dL — ABNORMAL HIGH (ref 0.44–1.00)
GFR, Estimated: 17 mL/min — ABNORMAL LOW (ref >60)
Glucose, Bld: 91 mg/dL (ref 70–99)
Potassium: 4 mmol/L (ref 3.5–5.1)
Sodium: 139 mmol/L (ref 135–145)

## 2022-01-09 LAB — GLUCOSE, CAPILLARY
Glucose-Capillary: 99 mg/dL (ref 70–99)
Glucose-Capillary: 81 mg/dL (ref 70–99)
Glucose-Capillary: 106 mg/dL — ABNORMAL HIGH (ref 70–99)

## 2022-01-09 LAB — CBC
HCT: 25.5 % — ABNORMAL LOW (ref 36.0–46.0)
Hemoglobin: 7.7 g/dL — ABNORMAL LOW (ref 12.0–15.0)
MCH: 28.4 pg (ref 26.0–34.0)
MCHC: 30.2 g/dL (ref 30.0–36.0)
MCV: 94.1 fL (ref 80.0–100.0)
Platelets: 144 10*3/uL — ABNORMAL LOW (ref 150–400)
RBC: 2.71 MIL/uL — ABNORMAL LOW (ref 3.87–5.11)
RDW: 16.9 % — ABNORMAL HIGH (ref 11.5–15.5)
WBC: 4.7 10*3/uL (ref 4.0–10.5)
nRBC: 0 % (ref 0.0–0.2)

## 2022-01-09 MED ORDER — FUROSEMIDE 20 MG PO TABS: 40.0000 mg | ORAL_TABLET | Freq: Every day | ORAL | 0 refills | Status: DC

## 2022-01-09 MED ORDER — ACETAMINOPHEN 325 MG PO TABS
650.0000 mg | ORAL_TABLET | Freq: Four times a day (QID) | ORAL | 0 refills | Status: DC | PRN
Start: 2022-01-09 — End: 2023-04-06

## 2022-01-09 MED ORDER — CARVEDILOL 6.25 MG PO TABS: 6.2500 mg | ORAL_TABLET | Freq: Two times a day (BID) | ORAL | 0 refills | Status: DC

## 2022-01-09 MED ORDER — ATORVASTATIN CALCIUM 10 MG PO TABS: 10.0000 mg | ORAL_TABLET | Freq: Every day | ORAL | 1 refills | Status: DC

## 2022-01-09 MED ORDER — PREGABALIN 75 MG PO CAPS: 75.0000 mg | ORAL_CAPSULE | Freq: Two times a day (BID) | ORAL | 0 refills | Status: DC

## 2022-01-09 NOTE — Discharge Summary (Signed)
Physician Discharge Summary  [name] [name] IDP:[id number] DOB: [birth year] DOA: 01/06/2022  PCP: [name] Held, DO  Admit date: 01/06/2022 Discharge date: 01/09/2022  Admitted From: Home Disposition: Home  Recommendations for Outpatient Follow-up:  Follow up with PCP in 1-2 weeks Please obtain BMP/CBC in one week Please follow up with orthopedic surgery as scheduled given our discussion, patient indicates she was instructed "never to walk again" postoperatively which is not consistent with available documentation  Home Health: None Equipment/Devices: Patient was discontinued on oxygen, no longer requires oxygen given her ambulatory screen here was negative  Discharge Condition: Stable CODE STATUS: Full Diet recommendation: Low-salt low-fat diabetic diet  Brief/Interim Summary: [name] [name] is a 66 y.o. 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    Of note, the patient was hospitalized in December 4431 for a complicated course of cellulitis involving the lower extremities.  As management for this, she is on long-term cefuroxime as well as Flagyl, and follows with Dr. Megan [name] of infectious disease.  Per chart review, most recent Dr. Megan [name] note, which occurred on 12/20/2021, conveys that patient is on oral cefuroxime and Flagyl and that her PICC line is not for IV antibiotic administration, but rather for recurrent PRBC transfusion in the setting of anemia.  In that note, Dr. Megan [name]  notes plan for continuation of oral cefuroxime and Flagyl.   Patient responded well here to diuretics, able to be weaned off of her chronic oxygen which was initially placed after COVID infection last year and never reevaluated per patient.  At this time continue home medications and antibiotics as previous, increase Lasix to 20 mg p.o. twice daily and continue to monitor daily weights, salt intake.  Patient would benefit from outpatient dietary follow-up as well given our discussion here and further education of his need for avoidance of canned foods which she reports she was never educated on before.  Close follow-up with PCP in the next 1 to 2 weeks, follow-up with nephrology given reported kidney transplant not currently on immunosuppressants, and follow-up with orthopedic surgery given her reasoning for not ambulating.  Continue follow-up with wound care for her bilateral lower extremity wounds, noted and present on admission and stable.  Discharge Diagnoses:  Principal Problem:   Acute on chronic diastolic CHF (congestive heart failure) (HCC) Active Problems:   Hyperlipidemia   Essential hypertension   Normocytic anemia   Type 2 diabetes mellitus with hyperglycemia, with long-term current use of insulin (HCC)   CRF (chronic renal failure), stage 4 (severe) (HCC)   Prolonged QT interval   Cellulitis    Discharge Instructions   Allergies as of 01/09/2022       Reactions   Bee Pollen Anaphylaxis   Fish-derived Products Hives, Shortness Of Breath, Swelling, Rash   Hives get in throat causing trouble breathing   Mushroom Extract Complex Anaphylaxis   Penicillins Anaphylaxis   **Tolerated cefepime March 2021 Did it involve swelling of the face/tongue/throat, SOB, or low BP? Yes Did it involve sudden or severe rash/hives, skin peeling, or any reaction on the inside of your mouth or nose?  No Did you need to seek medical attention at a hospital or doctor's office? Yes When did it last  happen?  A few months ago If all above answers are "NO", may proceed with cephalosporin use.   Rosemary Oil Anaphylaxis   Shellfish Allergy Hives, Shortness Of Breath, Swelling, Rash   Tomato Hives, Shortness Of Breath   Hives in throat causes her trouble breathing   Acetaminophen Other (See Comments)   GI upset   Aloe Vera Hives   Broccoli [brassica Oleracea] Hives   Acyclovir And Related Other (See Comments)   Unknown reaction   Naproxen Other (See Comments)   Unknown reaction        Medication List     STOP taking these medications    proparacaine 0.5 % ophthalmic solution Commonly known as: ALCAINE       TAKE these medications    acetaminophen 325 MG tablet Commonly known as: TYLENOL Take 2 tablets (650 mg total) by mouth every 6 (six) hours as needed for mild pain, fever or headache (or Fever >/= 101). Discontinue 500/1000 mg dosing What changed:  medication strength how much to take reasons to take this additional instructions   apixaban 5 MG Tabs tablet Commonly known as: ELIQUIS Take 1 tablet (5 mg total) by mouth 2 (two) times daily.   atorvastatin 10 MG tablet Commonly known as: LIPITOR Take 1 tablet (10 mg total) by mouth daily.   carvedilol 6.25 MG tablet Commonly known as: COREG Take 1 tablet (6.25 mg total) by mouth 2 (two) times daily with a meal. What changed: when to take this   cefUROXime 250 MG tablet Commonly known as: CEFTIN Take 1 tablet (250 mg total) by mouth 2 (two) times daily with a meal.   diazepam 5 MG tablet Commonly known as: VALIUM Take 1 tablet (5 mg total) by mouth every 12 (twelve) hours as needed for anxiety.   DULoxetine 60 MG capsule Commonly known as: CYMBALTA Take 1 capsule by mouth once daily   ferrous sulfate 325 (65 FE) MG tablet Take 1 tablet (325 mg total) by mouth 2 (two) times daily with a meal.   folic acid 1 MG tablet Commonly known as: FOLVITE Take 2 tablets by mouth once daily   FreeStyle Libre  14 Day Reader [name] [name] 1 Device by Does not apply route daily.   FreeStyle Libre 14 Day Sensor Misc 1 Device by Does not apply route daily.   furosemide 20 MG tablet Commonly known as: LASIX Take 2 tablets (40 mg total) by mouth daily.   HYDROcodone-acetaminophen 5-325 MG tablet Commonly known as: NORCO/VICODIN Take 1 tablet by mouth every 6 (six) hours as needed for moderate pain.   Lantus SoloStar 100 UNIT/ML Solostar Pen Generic drug: insulin glargine INJECT 20 UNITS SUBCUTANEOUSLY IN THE MORNING AND 40 AT NIGHT What changed:  how much to take when to take this additional instructions   metroNIDAZOLE 500 MG tablet Commonly known as: Flagyl Take 1 tablet (500 mg total) by mouth 3 (three) times daily.   montelukast 10 MG tablet Commonly known as: SINGULAIR TAKE 1 TABLET BY MOUTH AT BEDTIME   multivitamin with minerals Tabs tablet Take 1 tablet by mouth daily.   NONFORMULARY OR COMPOUNDED ITEM Power wheelchair  #1  as directed   ondansetron 8 MG tablet Commonly known as: Zofran Take 0.5 tablets (4 mg total) by mouth every 8 (eight) hours as needed for nausea or vomiting. What changed: how much to take   pregabalin  75 MG capsule Commonly known as: LYRICA Take 1 capsule (75 mg total) by mouth 2 (two) times daily.   VITAMIN D PO Take 125 mcg by mouth daily.        Allergies  Allergen Reactions   Bee Pollen Anaphylaxis   Fish-Derived Products Hives, Shortness Of Breath, Swelling and Rash    Hives get in throat causing trouble breathing   Mushroom Extract Complex Anaphylaxis   Penicillins Anaphylaxis    **Tolerated cefepime March 2021 Did it involve swelling of the face/tongue/throat, SOB, or low BP? Yes Did it involve sudden or severe rash/hives, skin peeling, or any reaction on the inside of your mouth or nose? No Did you need to seek medical attention at a hospital or doctor's office? Yes When did it last happen?  A few months ago If all above answers are  "NO", may proceed with cephalosporin use.   Rosemary Oil Anaphylaxis   Shellfish Allergy Hives, Shortness Of Breath, Swelling and Rash   Tomato Hives and Shortness Of Breath    Hives in throat causes her trouble breathing   Acetaminophen Other (See Comments)    GI upset   Aloe Vera Hives   Broccoli [Brassica Oleracea] Hives   Acyclovir And Related Other (See Comments)    Unknown reaction   Naproxen Other (See Comments)    Unknown reaction    Consultations: None   Procedures/Studies: DG Chest Port 1 View  Result Date: 01/07/2022 CLINICAL DATA:  Shortness of breath and chest pain. EXAM: PORTABLE CHEST 1 VIEW COMPARISON:  Chest radiograph dated 12/01/2021. FINDINGS: Right IJ central venous line with tip at the cavoatrial junction. There is cardiomegaly with vascular congestion and edema. Pneumonia is not excluded. Trace bilateral pleural effusions may be present. No pneumothorax. No acute osseous pathology. Old healed left clavicular and left shoulder fracture deformities as well as partially visualized spinal hardware. IMPRESSION: Cardiomegaly with vascular congestion and edema. Pneumonia is not excluded. Electronically Signed   By: Anner Crete M.D.   On: 01/07/2022 00:52   ECHOCARDIOGRAM COMPLETE  Result Date: 01/07/2022    ECHOCARDIOGRAM REPORT   Patient Name:   [name] [name] Date of Exam: 01/07/2022 Medical Rec #:  [id number]         Height:       65.0 in Accession #:    [id number]        Weight:       169.0 lb Date of Birth:  05/04/1956         BSA:          1.841 m Patient Age:    38 years          BP:           175/97 mmHg Patient Gender: F                 HR:           84 bpm. Exam Location:  Inpatient Procedure: 2D Echo Indications:    CHF  History:        Patient has prior history of Echocardiogram examinations, most                 recent 10/18/2021. CAD and Previous Myocardial Infarction,                 Signs/Symptoms:Shortness of Breath; Risk Factors:Diabetes and                  Hypertension.  Sonographer:  Arlyss Gandy Referring Phys: [id number] [place]  1. Left ventricular ejection fraction, by estimation, is 50 to 55%. The left ventricle has low normal function. Left ventricular endocardial border not optimally defined to evaluate regional wall motion. There is mild concentric left ventricular hypertrophy. Left ventricular diastolic parameters are consistent with Grade I diastolic dysfunction (impaired relaxation). Elevated left atrial pressure.  2. Right ventricular systolic function is low normal. The right ventricular size is normal. There is severely elevated pulmonary artery systolic pressure. The estimated right ventricular systolic pressure is 64.6 mmHg.  3. Left atrial size was moderately dilated.  4. Moderate pleural effusion in the left lateral region.  5. The mitral valve is abnormal. The posterior mitral valve leaflet is restricted with resultant moderate, posteriorly directed mitral valve regurgitation.  6. The aortic valve is tricuspid. There is mild calcification of the aortic valve. There is mild thickening of the aortic valve. Aortic valve regurgitation is not visualized. Aortic valve sclerosis/calcification is present, without any evidence of aortic stenosis.  7. Aortic dilatation noted. There is borderline dilatation of the aortic root, measuring 37 mm.  8. The inferior vena cava is dilated in size with <50% respiratory variability, suggesting right atrial pressure of 15 mmHg. Comparison(s): Compared to prior TTE on 09/2021, the LVEF appears slightly less robust 50-55% (previously 55-60%), the MR is now moderate in the setting of elevated filling pressures, there is severe pulmonary HTN (previously moderate). FINDINGS  Left Ventricle: Left ventricular ejection fraction, by estimation, is 50 to 55%. The left ventricle has low normal function. Left ventricular endocardial border not optimally defined to evaluate regional wall motion. The left  ventricular internal cavity  size was normal in size. There is mild concentric left ventricular hypertrophy. Left ventricular diastolic parameters are consistent with Grade I diastolic dysfunction (impaired relaxation). Elevated left atrial pressure. Right Ventricle: The right ventricular size is normal. No increase in right ventricular wall thickness. Right ventricular systolic function is low normal. There is severely elevated pulmonary artery systolic pressure. The tricuspid regurgitant velocity is 3.49 m/s, and with an assumed right atrial pressure of 15 mmHg, the estimated right ventricular systolic pressure is 80.3 mmHg. Left Atrium: Left atrial size was moderately dilated. Right Atrium: Right atrial size was normal in size. Pericardium: There is no evidence of pericardial effusion. Mitral Valve: The mitral valve is abnormal. There is mild thickening of the mitral valve leaflet(s). There is mild calcification of the mitral valve leaflet(s). Mild to moderate mitral annular calcification. The posterior mitral valve leaflet is restricted with resultant moderate, posteriorly directed mitral valve regurgitation. Tricuspid Valve: The tricuspid valve is normal in structure. Tricuspid valve regurgitation is mild. Aortic Valve: The aortic valve is tricuspid. There is mild calcification of the aortic valve. There is mild thickening of the aortic valve. Aortic valve regurgitation is not visualized. Aortic valve sclerosis/calcification is present, without any evidence of aortic stenosis. Aortic valve mean gradient measures 3.0 mmHg. Aortic valve peak gradient measures 6.2 mmHg. Aortic valve area, by VTI measures 2.45 cm. Pulmonic Valve: The pulmonic valve was normal in structure. Pulmonic valve regurgitation is trivial. Aorta: Aortic dilatation noted. There is borderline dilatation of the aortic root, measuring 37 mm. Venous: The inferior vena cava is dilated in size with less than 50% respiratory variability, suggesting  right atrial pressure of 15 mmHg. IAS/Shunts: The atrial septum is grossly normal. Additional Comments: There is a moderate pleural effusion in the left lateral region.  LEFT VENTRICLE PLAX 2D LVIDd:  4.90 cm   Diastology LVIDs:         4.10 cm   LV e' medial:    4.90 cm/s LV PW:         1.10 cm   LV E/e' medial:  24.3 LV IVS:        1.10 cm   LV e' lateral:   6.53 cm/s LVOT diam:     2.00 cm   LV E/e' lateral: 18.2 LV SV:         65 LV SV Index:   35 LVOT Area:     3.14 cm  RIGHT VENTRICLE             IVC RV Basal diam:  3.40 cm     IVC diam: 2.30 cm RV Mid diam:    3.40 cm RV S prime:     11.40 cm/s TAPSE (M-mode): 1.9 cm LEFT ATRIUM             Index        RIGHT ATRIUM           Index LA diam:        4.50 cm 2.44 cm/m   RA Area:     13.20 cm LA Vol (A2C):   74.9 ml 40.67 ml/m  RA Volume:   28.00 ml  15.21 ml/m LA Vol (A4C):   62.5 ml 33.94 ml/m LA Biplane Vol: 69.9 ml 37.96 ml/m  AORTIC VALVE                    PULMONIC VALVE AV Area (Vmax):    2.10 cm     PV Vmax:       0.82 m/s AV Area (Vmean):   2.19 cm     PV Peak grad:  2.7 mmHg AV Area (VTI):     2.45 cm AV Vmax:           125.00 cm/s AV Vmean:          85.300 cm/s AV VTI:            0.265 m AV Peak Grad:      6.2 mmHg AV Mean Grad:      3.0 mmHg LVOT Vmax:         83.60 cm/s LVOT Vmean:        59.500 cm/s LVOT VTI:          0.207 m LVOT/AV VTI ratio: 0.78  AORTA Ao Root diam: 2.90 cm Ao Asc diam:  3.00 cm MITRAL VALVE                TRICUSPID VALVE MV Area (PHT): 5.42 cm     TR Peak grad:   48.7 mmHg MV Decel Time: 140 msec     TR Vmax:        349.00 cm/s MV E velocity: 119.00 cm/s MV A velocity: 119.00 cm/s  SHUNTS MV E/A ratio:  1.00         Systemic VTI:  0.21 m                             Systemic Diam: 2.00 cm Gwyndolyn Kaufman MD Electronically signed by Gwyndolyn Kaufman MD Signature Date/Time: 01/07/2022/3:37:15 PM    Final      Subjective: No acute issues or events overnight ambulating without hypoxia denies nausea vomiting  diarrhea constipation headache fevers chills or chest pain   Discharge Exam:  Vitals:   01/09/22 0752 01/09/22 1123  BP: (!) 142/63 (!) 151/71  Pulse: 79 80  Resp: 20 16  Temp: 98 F (36.7 C) 98.1 F (36.7 C)  SpO2: 95% 100%   Vitals:   01/09/22 0006 01/09/22 0424 01/09/22 0752 01/09/22 1123  BP: (!) 158/78 (!) 143/65 (!) 142/63 (!) 151/71  Pulse: 69 73 79 80  Resp: 15 20 20 16   Temp: 97.6 F (36.4 C) (!) 97.5 F (36.4 C) 98 F (36.7 C) 98.1 F (36.7 C)  TempSrc: Oral Oral Oral Oral  SpO2: 100% 100% 95% 100%  Weight:      Height:        General:  Pleasantly resting in bed, No acute distress. Lungs: Scant bibasilar rales Heart:  Regular rate and rhythm.  Without murmurs, rubs, or gallops. Abdomen:  Soft, nontender, nondistended.  Without guarding or rebound. Extremities: Bilateral lower extremity scant to 1+ pitting edema - patient continues to leave her legs down while sitting up at side of bed Skin:  Warm and dry noted bilateral foot wounds:           The results of significant diagnostics from this hospitalization (including imaging, microbiology, ancillary and laboratory) are listed below for reference.     Microbiology: Recent Results (from the past 240 hour(s))  Resp Panel by RT-PCR (Flu A&B, Covid) Nasopharyngeal Swab     Status: None   Collection Time: 01/07/22 12:58 AM   Specimen: Nasopharyngeal Swab; Nasopharyngeal(NP) swabs in vial transport medium  Result Value Ref Range Status   SARS Coronavirus 2 by RT PCR NEGATIVE NEGATIVE Final    Comment: (NOTE) SARS-CoV-2 target nucleic acids are NOT DETECTED.  The SARS-CoV-2 RNA is generally detectable in upper respiratory specimens during the acute phase of infection. The lowest concentration of SARS-CoV-2 viral copies this assay can detect is 138 copies/mL. A negative result does not preclude SARS-Cov-2 infection and should not be used as the sole basis for treatment or other patient management  decisions. A negative result may occur with  improper specimen collection/handling, submission of specimen other than nasopharyngeal swab, presence of viral mutation(s) within the areas targeted by this assay, and inadequate number of viral copies(<138 copies/mL). A negative result must be combined with clinical observations, patient history, and epidemiological information. The expected result is Negative.  Fact Sheet for Patients:  EntrepreneurPulse.com.au  Fact Sheet for Healthcare Providers:  IncredibleEmployment.be  This test is no t yet approved or cleared by the [place] FDA and  has been authorized for detection and/or diagnosis of SARS-CoV-2 by FDA under an Emergency Use Authorization (EUA). This EUA will remain  in effect (meaning this test can be used) for the duration of the COVID-19 declaration under Section 564(b)(1) of the Act, 21 U.S.C.section 360bbb-3(b)(1), unless the authorization is terminated  or revoked sooner.       Influenza A by PCR NEGATIVE NEGATIVE Final   Influenza B by PCR NEGATIVE NEGATIVE Final    Comment: (NOTE) The Xpert Xpress SARS-CoV-2/FLU/RSV plus assay is intended as an aid in the diagnosis of influenza from Nasopharyngeal swab specimens and should not be used as a sole basis for treatment. Nasal washings and aspirates are unacceptable for Xpert Xpress SARS-CoV-2/FLU/RSV testing.  Fact Sheet for Patients: EntrepreneurPulse.com.au  Fact Sheet for Healthcare Providers: IncredibleEmployment.be  This test is not yet approved or cleared by the [place] FDA and has been authorized for detection and/or diagnosis of SARS-CoV-2 by FDA under an Emergency Use Authorization (EUA). This  EUA will remain in effect (meaning this test can be used) for the duration of the COVID-19 declaration under Section 564(b)(1) of the Act, 21 U.S.C. section 360bbb-3(b)(1), unless the  authorization is terminated or revoked.  Performed at [facility], [address] [place], [place] [address]      Labs: BNP (last 3 results) Recent Labs    10/21/21 0226 12/01/21 1626 01/06/22 2338  BNP 1,149.3* 860.9* 5,701.7*   Basic Metabolic Panel: Recent Labs  Lab 01/06/22 2338 01/07/22 0049 01/07/22 0542 01/09/22 0400  NA 139 142 139 139  K 4.3 4.2 4.4 4.0  CL 108  --  109 108  CO2 24  --  22 24  GLUCOSE 118*  --  130* 91  BUN 32*  --  30* 31*  CREATININE 2.57*  --  2.59* 2.99*  CALCIUM 8.4*  --  8.5* 8.3*  MG  --   --  1.5*  --   PHOS  --   --  4.9*  --    Liver Function Tests: Recent Labs  Lab 01/06/22 2338 01/07/22 0542  AST 19 16  ALT 8 8  ALKPHOS 68 58  BILITOT 0.5 0.6  PROT 8.4* 7.4  ALBUMIN 3.1* 2.7*   Recent Labs  Lab 01/06/22 2338  LIPASE 53*   No results for input(s): AMMONIA in the last 168 hours. CBC: Recent Labs  Lab 01/06/22 2338 01/07/22 0049 01/07/22 0542 01/09/22 0400  WBC 5.6  --  6.2 4.7  NEUTROABS 4.2  --  5.1  --   HGB 9.2* 9.5* 8.1* 7.7*  HCT 30.6* 28.0* 27.0* 25.5*  MCV 95.3  --  94.7 94.1  PLT 146*  --  148* 144*   Cardiac Enzymes: No results for input(s): CKTOTAL, CKMB, CKMBINDEX, TROPONINI in the last 168 hours. BNP: Invalid input(s): POCBNP CBG: Recent Labs  Lab 01/08/22 1701 01/08/22 2132 01/09/22 0604 01/09/22 0749 01/09/22 1120  GLUCAP 114* 137* 99 81 106*   D-Dimer No results for input(s): DDIMER in the last 72 hours. Hgb A1c No results for input(s): HGBA1C in the last 72 hours. Lipid Profile No results for input(s): CHOL, HDL, LDLCALC, TRIG, CHOLHDL, LDLDIRECT in the last 72 hours. Thyroid function studies No results for input(s): TSH, T4TOTAL, T3FREE, THYROIDAB in the last 72 hours.  Invalid input(s): FREET3 Anemia work up No results for input(s): VITAMINB12, FOLATE, FERRITIN, TIBC, IRON, RETICCTPCT in the last 72 hours. Urinalysis    Component Value Date/Time    COLORURINE YELLOW 01/07/2022 1745   APPEARANCEUR CLEAR 01/07/2022 1745   LABSPEC 1.011 01/07/2022 1745   PHURINE 5.0 01/07/2022 1745   GLUCOSEU NEGATIVE 01/07/2022 1745   GLUCOSEU 500 (A) 12/03/2019 1309   HGBUR MODERATE (A) 01/07/2022 1745   HGBUR moderate 08/05/2010 1048   BILIRUBINUR NEGATIVE 01/07/2022 1745   BILIRUBINUR neg 11/15/2017 1241   KETONESUR NEGATIVE 01/07/2022 1745   PROTEINUR 100 (A) 01/07/2022 1745   UROBILINOGEN >=8.0 (A) 12/03/2019 1309   NITRITE NEGATIVE 01/07/2022 1745   LEUKOCYTESUR TRACE (A) 01/07/2022 1745   Sepsis Labs Invalid input(s): PROCALCITONIN,  WBC,  LACTICIDVEN Microbiology Recent Results (from the past 240 hour(s))  Resp Panel by RT-PCR (Flu A&B, Covid) Nasopharyngeal Swab     Status: None   Collection Time: 01/07/22 12:58 AM   Specimen: Nasopharyngeal Swab; Nasopharyngeal(NP) swabs in vial transport medium  Result Value Ref Range Status   SARS Coronavirus 2 by RT PCR NEGATIVE NEGATIVE Final    Comment: (NOTE) SARS-CoV-2 target  nucleic acids are NOT DETECTED.  The SARS-CoV-2 RNA is generally detectable in upper respiratory specimens during the acute phase of infection. The lowest concentration of SARS-CoV-2 viral copies this assay can detect is 138 copies/mL. A negative result does not preclude SARS-Cov-2 infection and should not be used as the sole basis for treatment or other patient management decisions. A negative result may occur with  improper specimen collection/handling, submission of specimen other than nasopharyngeal swab, presence of viral mutation(s) within the areas targeted by this assay, and inadequate number of viral copies(<138 copies/mL). A negative result must be combined with clinical observations, patient history, and epidemiological information. The expected result is Negative.  Fact Sheet for Patients:  EntrepreneurPulse.com.au  Fact Sheet for Healthcare Providers:   IncredibleEmployment.be  This test is no t yet approved or cleared by the [place] FDA and  has been authorized for detection and/or diagnosis of SARS-CoV-2 by FDA under an Emergency Use Authorization (EUA). This EUA will remain  in effect (meaning this test can be used) for the duration of the COVID-19 declaration under Section 564(b)(1) of the Act, 21 U.S.C.section 360bbb-3(b)(1), unless the authorization is terminated  or revoked sooner.       Influenza A by PCR NEGATIVE NEGATIVE Final   Influenza B by PCR NEGATIVE NEGATIVE Final    Comment: (NOTE) The Xpert Xpress SARS-CoV-2/FLU/RSV plus assay is intended as an aid in the diagnosis of influenza from Nasopharyngeal swab specimens and should not be used as a sole basis for treatment. Nasal washings and aspirates are unacceptable for Xpert Xpress SARS-CoV-2/FLU/RSV testing.  Fact Sheet for Patients: EntrepreneurPulse.com.au  Fact Sheet for Healthcare Providers: IncredibleEmployment.be  This test is not yet approved or cleared by the [place] FDA and has been authorized for detection and/or diagnosis of SARS-CoV-2 by FDA under an Emergency Use Authorization (EUA). This EUA will remain in effect (meaning this test can be used) for the duration of the COVID-19 declaration under Section 564(b)(1) of the Act, 21 U.S.C. section 360bbb-3(b)(1), unless the authorization is terminated or revoked.  Performed at [facility], [address] [place], [place] [address]      Time coordinating discharge: Over 30 minutes  SIGNED:   Little Ishikawa, DO [facility] 01/09/2022, 12:04 PM Pager   If 7PM-7AM, please contact night-coverage [email or web address]

## 2022-01-09 NOTE — TOC Transition Note (Addendum)
Transition of Care (TOC) - CM/SW Discharge Note ? ? ?Patient Details  ?Name: [name] [name] ?MRN: [id number] ?Date of Birth: 06/28/1956 ? ?Transition of Care (TOC) CM/SW Contact:  ?Tom-Johnson, Renea Ee, RN ?Phone Number: ?01/09/2022, 1:38 PM ? ? ?Clinical Narrative:    ? ?Patient is scheduled for discharge today. Admitted for Acute on Chronic Diastolic Heart Failure and found to be Covid positive. From home with husband. Has a daughter whom is supportive with care. 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 Does not drive, husband transports to and from appointments. On home Oxygen with [place], MD to discontinue due to ambulatory screen here was negative. Has a walker and shower seat at home. PCP is Carollee Herter, Alferd Apa, DO and uses [facility] on [address]. Active with [facility] for bilateral foot wounds.  ?Denies any other TOC needs. Husband to transport at discharge. No further TOC needs noted. ? ?Final next level of care: Home/Self Care ?Barriers to Discharge: Barriers Resolved ? ? ?Patient Goals and CMS Choice ?Patient states their goals for this hospitalization and ongoing recovery are:: To return home ?CMS Medicare.gov Compare Post Acute Care list provided to:: Patient ?Choice offered to / list presented to : NA ? ?Discharge Placement ?  ?           ?  ?Patient to be transferred to facility by: Husband ?  ?  ? ?Discharge Plan and Services ?  ?  ?           ?DME Arranged: N/A ?DME Agency: NA ?  ?  ?  ?HH Arranged: NA ?[place] Agency: NA ?  ?  ?  ? ?Social Determinants of Health (SDOH) Interventions ?  ? ? ?Readmission Risk Interventions ?Readmission Risk Prevention Plan 01/23/2020 11/18/2019  ?Transportation Screening Complete Complete  ?Medication Review Press photographer) Complete Referral to Pharmacy  ?PCP or Specialist appointment within 3-5 days of discharge Complete Complete  ?[place] or Home Care Consult Complete Complete  ?SW  Recovery Care/Counseling Consult Complete Complete  ?Palliative Care Screening Complete Not Applicable  ?Skilled Nursing Facility Complete Not Applicable  ?Some recent data might be hidden  ? ? ? ? ? ?

## 2022-01-09 NOTE — Consult Note (Addendum)
WOC Nurse Consult Note: ?Reason for Consult: Consult requested for bilat feet.  Pt states she has been followed by Dr Sharol Given of the ortho service in the past but is currently seen weekly at the outpatient wound care center.  They perform serial debridements and have ordered Silver Hydrofiber to be applied every other day.  ?Wound type: Left plantar poster foot, near heel with chronic full thickness wound; 4X4X.8cm, 20% red, 80% yellow, with dry yellow slightly raised callous to wound edges, mod amt tan drainage, no odor or fluctuance. ?Right outer anterior foot with dry yellow healing full thickness wound, 1X1X.1cm ?Right outer plantar foot with healing full thickness wound, 1.5X1.5X.1cm ?Dressing procedure/placement/frequency: Topical treatment orders provided for bedside nurses to perform as follows: Change dressings to left and right feet Q M/W.F as follows:  Cut piece of Aquacel Kellie Simmering # [phone]) abd apply to left heel and right foot wounds (2) Q day, then cover with ABD pads and kerlex.  Moisten with NS to remove previous dressings each time.) ?Pt plans to follow-up with the outpatient wound care center after discharge.  ?Please re-consult if further assistance is needed.  Thank-you,  ?Julien Girt MSN, RN, [place], [place], CNS ?[phone]  ? ?  ?

## 2022-01-09 NOTE — Progress Notes (Signed)
Mobility Specialist Progress Note ? ? 01/09/22 1343  ?Mobility  ?Activity Transferred to/from [facility]  ?Level of Assistance Contact guard assist, steadying assist  ?Assistive Device Front wheel walker  ?Distance Ambulated (ft) 2 ft  ?Activity Response Tolerated well  ?$Mobility charge 1 Mobility  ? ?Received pt on EOB on RA requesting assistance to [facility]. Requiring HHA for stability upon standing. Tx'd w/o fault. Left on [facility] w/ call bell by side and RN notified.  ? ?Holland Falling ?Mobility Specialist ?Phone Number [phone] ? ?

## 2022-01-10 ENCOUNTER — Ambulatory Visit: Payer: PRIVATE HEALTH INSURANCE | Admitting: *Deleted

## 2022-01-10 ENCOUNTER — Encounter (HOSPITAL_BASED_OUTPATIENT_CLINIC_OR_DEPARTMENT_OTHER): Payer: PRIVATE HEALTH INSURANCE | Admitting: General Surgery

## 2022-01-10 DIAGNOSIS — M00021 Staphylococcal arthritis, right elbow: Secondary | ICD-10-CM

## 2022-01-10 DIAGNOSIS — I252 Old myocardial infarction: Secondary | ICD-10-CM

## 2022-01-10 DIAGNOSIS — E785 Hyperlipidemia, unspecified: Secondary | ICD-10-CM

## 2022-01-10 DIAGNOSIS — E1165 Type 2 diabetes mellitus with hyperglycemia: Secondary | ICD-10-CM

## 2022-01-10 DIAGNOSIS — E1142 Type 2 diabetes mellitus with diabetic polyneuropathy: Secondary | ICD-10-CM

## 2022-01-10 DIAGNOSIS — N1832 Chronic kidney disease, stage 3b: Secondary | ICD-10-CM

## 2022-01-10 DIAGNOSIS — N179 Acute kidney failure, unspecified: Secondary | ICD-10-CM

## 2022-01-10 DIAGNOSIS — L089 Local infection of the skin and subcutaneous tissue, unspecified: Secondary | ICD-10-CM

## 2022-01-10 DIAGNOSIS — E669 Obesity, unspecified: Secondary | ICD-10-CM

## 2022-01-10 DIAGNOSIS — F418 Other specified anxiety disorders: Secondary | ICD-10-CM

## 2022-01-10 NOTE — Chronic Care Management (AMB) (Signed)
?Chronic Care Management  ? ? Clinical Social Work Note ? ?01/10/2022 ?Name: [name] [name] MRN: [id number] DOB: February 02, 1956 ? ?[name] [name] is a 66 y.o. year old adult who is a primary care patient of Ann Held, DO. The CCM team was consulted to assist the patient with chronic disease management and/or care coordination needs related to: [employer], Mental Health Counseling and Resources, and Financial Difficulties.  ? ?Engaged with patient by telephone for follow up visit in response to provider referral for social work chronic care management and care coordination services.  ? ?Consent to Services:  ?The patient was given information about Chronic Care Management services, agreed to services, and gave verbal consent prior to initiation of services.  Please see initial visit note for detailed documentation.  ? ?Patient agreed to services and consent obtained.  ? ?Assessment: Review of patient past medical history, allergies, medications, and health status, including review of relevant consultants reports was performed today as part of a comprehensive evaluation and provision of chronic care management and care coordination services.    ? ?SDOH (Social Determinants of Health) assessments and interventions performed:   ? ?Advanced Directives Status: Not addressed in this encounter. ? ?CCM Care Plan ? ?Allergies  ?Allergen Reactions  ? Bee Pollen Anaphylaxis  ? Fish-Derived Products Hives, Shortness Of Breath, Swelling and Rash  ?  Hives get in throat causing trouble breathing  ? Mushroom Extract Complex Anaphylaxis  ? Penicillins Anaphylaxis  ?  **Tolerated cefepime March 2021 ?Did it involve swelling of the face/tongue/throat, SOB, or low BP? Yes ?Did it involve sudden or severe rash/hives, skin peeling, or any reaction on the inside of your mouth or nose? No ?Did you need to seek medical attention at a hospital or doctor's office? Yes ?When did it last happen?  A few months ago ?If all  above answers are "NO", may proceed with cephalosporin use.  ? Rosemary Oil Anaphylaxis  ? Shellfish Allergy Hives, Shortness Of Breath, Swelling and Rash  ? Tomato Hives and Shortness Of Breath  ?  Hives in throat causes her trouble breathing  ? Acetaminophen Other (See Comments)  ?  GI upset  ? Aloe Vera Hives  ? Broccoli [Brassica Oleracea] Hives  ? Acyclovir And Related Other (See Comments)  ?  Unknown reaction  ? Naproxen Other (See Comments)  ?  Unknown reaction  ? ? ?Outpatient Encounter Medications as of 01/10/2022  ?Medication Sig Note  ? acetaminophen (TYLENOL) 325 MG tablet Take 2 tablets (650 mg total) by mouth every 6 (six) hours as needed for mild pain, fever or headache (or Fever >/= 101). Discontinue 500/1000 mg dosing   ? apixaban (ELIQUIS) 5 MG TABS tablet Take 1 tablet (5 mg total) by mouth 2 (two) times daily.   ? atorvastatin (LIPITOR) 10 MG tablet Take 1 tablet (10 mg total) by mouth daily.   ? carvedilol (COREG) 6.25 MG tablet Take 1 tablet (6.25 mg total) by mouth 2 (two) times daily with a meal.   ? cefUROXime (CEFTIN) 250 MG tablet Take 1 tablet (250 mg total) by mouth 2 (two) times daily with a meal. 01/08/2022: 11/29/21 #60 x 30 days. Per spouse, there are only a couple tabs left, suggesting the patient is nearly finished with this 30 day course.  ? Continuous Blood Gluc Receiver (FREESTYLE LIBRE 14 DAY READER) DEVI 1 Device by Does not apply route daily.   ? Continuous Blood Gluc Sensor (FREESTYLE LIBRE 14 DAY SENSOR) MISC 1  Device by Does not apply route daily.   ? diazepam (VALIUM) 5 MG tablet Take 1 tablet (5 mg total) by mouth every 12 (twelve) hours as needed for anxiety.   ? DULoxetine (CYMBALTA) 60 MG capsule Take 1 capsule by mouth once daily (Patient taking differently: Take 60 mg by mouth daily.)   ? ferrous sulfate 325 (65 FE) MG tablet Take 1 tablet (325 mg total) by mouth 2 (two) times daily with a meal.   ? folic acid (FOLVITE) 1 MG tablet Take 2 tablets by mouth once daily  (Patient taking differently: Take 2 mg by mouth daily.) 01/08/2022: Pt's husband reported that the pt has been out of this medication for roughly a week, and is in need of more refills.  ? furosemide (LASIX) 20 MG tablet Take 2 tablets (40 mg total) by mouth daily.   ? HYDROcodone-acetaminophen (NORCO/VICODIN) 5-325 MG tablet Take 1 tablet by mouth every 6 (six) hours as needed for moderate pain.   ? insulin glargine (LANTUS SOLOSTAR) 100 UNIT/ML Solostar Pen INJECT 20 UNITS SUBCUTANEOUSLY IN THE MORNING AND 40 AT NIGHT (Patient taking differently: 15-18 Units at bedtime.)   ? metroNIDAZOLE (FLAGYL) 500 MG tablet Take 1 tablet (500 mg total) by mouth 3 (three) times daily. 01/08/2022: 01/06/22 #90 x 30 days. Pt's husband reported that the patient just started this course on 03/10. Thus meaning the patient has 28 days remaining.  ? montelukast (SINGULAIR) 10 MG tablet TAKE 1 TABLET BY MOUTH AT BEDTIME (Patient taking differently: Take 10 mg by mouth at bedtime.) 01/08/2022: Spouse reports that the pt has been out of this medication and needs more refills. PCP will not grant these refills and has not expressed as to why. Per spouse, pt needs this medication still.  ? Multiple Vitamin (MULTIVITAMIN WITH MINERALS) TABS tablet Take 1 tablet by mouth daily.   ? NONFORMULARY OR COMPOUNDED ITEM Power wheelchair  #1  as directed   ? ondansetron (ZOFRAN) 8 MG tablet Take 0.5 tablets (4 mg total) by mouth every 8 (eight) hours as needed for nausea or vomiting. (Patient taking differently: Take 4-8 mg by mouth every 8 (eight) hours as needed for nausea or vomiting.) 01/08/2022: Spouse reported that the pt is all out of this medication and in need of refills.  ? pregabalin (LYRICA) 75 MG capsule Take 1 capsule (75 mg total) by mouth 2 (two) times daily.   ? VITAMIN D PO Take 125 mcg by mouth daily.   ? [DISCONTINUED] metFORMIN (GLUCOPHAGE) 1000 MG tablet Take 1,000 mg by mouth 2 (two) times daily with a meal.     ? [DISCONTINUED]  omeprazole (PRILOSEC) 20 MG capsule Take 20 mg by mouth daily.     ? ?No facility-administered encounter medications on file as of 01/10/2022.  ? ? ?Patient Active Problem List  ? Diagnosis Date Noted  ? Acute CHF (congestive heart failure) ([place]) 01/07/2022  ? Acute on chronic diastolic CHF (congestive heart failure) ([place]) 01/07/2022  ? Prolonged QT interval 01/07/2022  ? Cellulitis 01/07/2022  ? Streptococcal bacteremia   ? Severe protein-calorie malnutrition ([place])   ? Severe sepsis ([place])   ? 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 Cellulitis of right foot 10/16/2021  ? Hyponatremia 10/16/2021  ? Hyperkalemia 10/16/2021  ? Acute kidney injury superimposed on CKD ([place]) 10/16/2021  ? Leukocytosis 10/16/2021  ? Diabetic foot ulcers ([name]) 10/16/2021  ? DM (diabetes mellitus), type 2 with renal complications ([place]) 40/97/3532  ? Generalized weakness 10/16/2021  ?  Portal hypertension ([place]) 08/16/2021  ? 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 Eczema of scalp 06/27/2021  ? Hypothyroidism 06/27/2021  ? Pruritus 06/27/2021  ? Seasonal allergic rhinitis due to pollen 02/02/2021  ? CRF (chronic renal failure), stage 4 (severe) ([place]) 02/02/2021  ? PTSD (post-traumatic stress disorder)   ? Osteoarthritis   ? MRSA (methicillin resistant Staphylococcus aureus)   ? Hypertension   ? High cholesterol   ? Hepatic steatosis   ? GERD (gastroesophageal reflux disease)   ? Fibromyalgia   ? Fatty liver   ? Exertional shortness of breath   ? Episode of visual loss of left eye   ? Depression   ? Dehiscence of closure of skin   ? Constipation   ? Chronic kidney disease   ? Cataract   ? Bulging lumbar disc   ? Asthma   ? Anxiety   ? Anginal pain ([place])   ? Anemia   ? Tinea capitis 10/11/2020  ? Tinea corporis 10/11/2020  ? SOB (shortness of breath) 10/11/2020  ? Hip pain 05/16/2020  ? IDA (iron deficiency anemia) 05/04/2020  ? 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  S/P lumbar fusion 03/04/2020  ? Spinal stenosis of lumbar region 01/22/2020  ? Osteomyelitis of vertebra of thoracolumbar region [facility]) 01/21/2020  ? Compression fracture of L1 lumbar vertebra ([facility]) 01/18/202

## 2022-01-10 NOTE — Patient Instructions (Signed)
Visit Information ? ?Thank you for taking time to visit with me today. Please don't hesitate to contact me if I can be of assistance to you before our next scheduled telephone appointment. ? ?Following are the goals we discussed today:  ?Patient Goals/Self-Care Activities: ?Continue to receive personal counseling with LCSW, on a bi-weekly basis, to reduce and manage symptoms of Anxiety, Depression, and Post-Traumatic Stress Disorder, until well-managed.   ?Continue to incorporate into daily practice - relaxation techniques, deep breathing exercises, and mindfulness meditation strategies. ?Thorough review and completion of Aid and Attendance Benefits Application.   ?Contact LCSW directly (# [phone]), if you have questions, need assistance, or if additional social work needs are identified between now and our next scheduled telephone outreach call. ?Follow-Up Date:  01/24/2022 at 3:00 pm ? ?Please call the care guide team at [phone] if you need to cancel or reschedule your appointment.  ? ?If you are experiencing a Mental Health or [place] or need someone to talk to, please call the Suicide and Crisis Lifeline: 988 ?call the [place] National Suicide Prevention Lifeline: 6268290156 or TTY: 416-772-0493 TTY 3327213579) to talk to a trained counselor ?call 1-800-273-TALK (toll free, 24 hour hotline) ?go to [facility] [address] [place] [phone]) ?call the [facility]: [phone] ?call 911  ? ?Patient verbalizes understanding of instructions and care plan provided today and agrees to view in [place]. Active MyChart status confirmed with patient.   ? ?Nat Christen LCSW ?Licensed Clinical Social Worker ?[place] ?[phone]  ?

## 2022-01-13 ENCOUNTER — Telehealth: Payer: Self-pay

## 2022-01-13 NOTE — Telephone Encounter (Signed)
[name] [name] from [facility] called wanting verification that her form for Wheelchair eval was received.  ? ?CB number is [phone] ?

## 2022-01-17 ENCOUNTER — Ambulatory Visit: Payer: PRIVATE HEALTH INSURANCE | Admitting: Internal Medicine

## 2022-01-17 NOTE — Telephone Encounter (Signed)
Have not seen forms yet. LVM advising to resend ?

## 2022-01-18 ENCOUNTER — Telehealth: Payer: Self-pay

## 2022-01-18 ENCOUNTER — Inpatient Hospital Stay: Payer: PRIVATE HEALTH INSURANCE

## 2022-01-18 ENCOUNTER — Other Ambulatory Visit: Payer: Self-pay | Admitting: Family

## 2022-01-18 ENCOUNTER — Inpatient Hospital Stay: Payer: PRIVATE HEALTH INSURANCE | Attending: Hematology & Oncology

## 2022-01-18 ENCOUNTER — Other Ambulatory Visit: Payer: Self-pay

## 2022-01-18 VITALS — BP 126/83 | HR 64 | Temp 97.6°F | Resp 17

## 2022-01-18 DIAGNOSIS — D631 Anemia in chronic kidney disease: Secondary | ICD-10-CM | POA: Diagnosis present

## 2022-01-18 DIAGNOSIS — D508 Other iron deficiency anemias: Secondary | ICD-10-CM

## 2022-01-18 DIAGNOSIS — N183 Chronic kidney disease, stage 3 unspecified: Secondary | ICD-10-CM | POA: Diagnosis present

## 2022-01-18 DIAGNOSIS — D500 Iron deficiency anemia secondary to blood loss (chronic): Secondary | ICD-10-CM

## 2022-01-18 DIAGNOSIS — D649 Anemia, unspecified: Secondary | ICD-10-CM

## 2022-01-18 LAB — CMP (CANCER CENTER ONLY)
ALT: 5 U/L (ref 0–44)
AST: 14 U/L — ABNORMAL LOW (ref 15–41)
Albumin: 3.2 g/dL — ABNORMAL LOW (ref 3.5–5.0)
Alkaline Phosphatase: 63 U/L (ref 38–126)
Anion gap: 7 (ref 5–15)
BUN: 54 mg/dL — ABNORMAL HIGH (ref 8–23)
CO2: 21 mmol/L — ABNORMAL LOW (ref 22–32)
Calcium: 8.6 mg/dL — ABNORMAL LOW (ref 8.9–10.3)
Chloride: 106 mmol/L (ref 98–111)
Creatinine: 3.06 mg/dL (ref 0.44–1.00)
GFR, Estimated: 16 mL/min — ABNORMAL LOW (ref >60)
Glucose, Bld: 85 mg/dL (ref 70–99)
Potassium: 5 mmol/L (ref 3.5–5.1)
Sodium: 134 mmol/L — ABNORMAL LOW (ref 135–145)
Total Bilirubin: 0.5 mg/dL (ref 0.3–1.2)
Total Protein: 7.4 g/dL (ref 6.5–8.1)

## 2022-01-18 LAB — CBC WITH DIFFERENTIAL (CANCER CENTER ONLY)
Abs Immature Granulocytes: 0.01 10*3/uL (ref 0.00–0.07)
Basophils Absolute: 0 10*3/uL (ref 0.0–0.1)
Basophils Relative: 1 %
Eosinophils Absolute: 0.3 10*3/uL (ref 0.0–0.5)
Eosinophils Relative: 7 %
HCT: 25.3 % — ABNORMAL LOW (ref 36.0–46.0)
Hemoglobin: 7.7 g/dL — ABNORMAL LOW (ref 12.0–15.0)
Immature Granulocytes: 0 %
Lymphocytes Relative: 18 %
Lymphs Abs: 0.8 10*3/uL (ref 0.7–4.0)
MCH: 28.7 pg (ref 26.0–34.0)
MCHC: 30.4 g/dL (ref 30.0–36.0)
MCV: 94.4 fL (ref 80.0–100.0)
Monocytes Absolute: 0.5 10*3/uL (ref 0.1–1.0)
Monocytes Relative: 12 %
Neutro Abs: 2.6 10*3/uL (ref 1.7–7.7)
Neutrophils Relative %: 62 %
Platelet Count: 134 10*3/uL — ABNORMAL LOW (ref 150–400)
RBC: 2.68 MIL/uL — ABNORMAL LOW (ref 3.87–5.11)
RDW: 16.4 % — ABNORMAL HIGH (ref 11.5–15.5)
WBC Count: 4.1 10*3/uL (ref 4.0–10.5)
nRBC: 0 % (ref 0.0–0.2)

## 2022-01-18 LAB — RETICULOCYTES
Immature Retic Fract: 15.9 % (ref 2.3–15.9)
RBC.: 2.68 MIL/uL — ABNORMAL LOW (ref 3.87–5.11)
Retic Count, Absolute: 81.2 10*3/uL (ref 19.0–186.0)
Retic Ct Pct: 3 % (ref 0.4–3.1)

## 2022-01-18 LAB — SAMPLE TO BLOOD BANK

## 2022-01-18 MED ORDER — EPOETIN ALFA-EPBX 40000 UNIT/ML IJ SOLN
40000.0000 [IU] | Freq: Once | INTRAMUSCULAR | Status: AC
  Administered 2022-01-18: 40000 [IU] via SUBCUTANEOUS
  Filled 2022-01-18: qty 1

## 2022-01-18 NOTE — Patient Instructions (Signed)
Epoetin Alfa injection °What is this medication? °EPOETIN ALFA (e POE e tin AL fa) helps your body make more red blood cells. This medicine is used to treat anemia caused by chronic kidney disease, cancer chemotherapy, or HIV-therapy. It may also be used before surgery if you have anemia. °This medicine may be used for other purposes; ask your health care provider or pharmacist if you have questions. °COMMON BRAND NAME(S): Epogen, Procrit, Retacrit °What should I tell my care team before I take this medication? °They need to know if you have any of these conditions: °cancer °heart disease °high blood pressure °history of blood clots °history of stroke °low levels of folate, iron, or vitamin B12 in the blood °seizures °an unusual or allergic reaction to erythropoietin, albumin, benzyl alcohol, hamster proteins, other medicines, foods, dyes, or preservatives °pregnant or trying to get pregnant °breast-feeding °How should I use this medication? °This medicine is for injection into a vein or under the skin. It is usually given by a health care professional in a hospital or clinic setting. °If you get this medicine at home, you will be taught how to prepare and give this medicine. Use exactly as directed. Take your medicine at regular intervals. Do not take your medicine more often than directed. °It is important that you put your used needles and syringes in a special sharps container. Do not put them in a trash can. If you do not have a sharps container, call your pharmacist or healthcare provider to get one. °A special MedGuide will be given to you by the pharmacist with each prescription and refill. Be sure to read this information carefully each time. °Talk to your pediatrician regarding the use of this medicine in children. While this drug may be prescribed for selected conditions, precautions do apply. °Overdosage: If you think you have taken too much of this medicine contact a poison control center or emergency  room at once. °NOTE: This medicine is only for you. Do not share this medicine with others. °What if I miss a dose? °If you miss a dose, take it as soon as you can. If it is almost time for your next dose, take only that dose. Do not take double or extra doses. °What may interact with this medication? °Interactions have not been studied. °This list may not describe all possible interactions. Give your health care provider a list of all the medicines, herbs, non-prescription drugs, or dietary supplements you use. Also tell them if you smoke, drink alcohol, or use illegal drugs. Some items may interact with your medicine. °What should I watch for while using this medication? °Your condition will be monitored carefully while you are receiving this medicine. °You may need blood work done while you are taking this medicine. °This medicine may cause a decrease in vitamin B6. You should make sure that you get enough vitamin B6 while you are taking this medicine. Discuss the foods you eat and the vitamins you take with your health care professional. °What side effects may I notice from receiving this medication? °Side effects that you should report to your doctor or health care professional as soon as possible: °allergic reactions like skin rash, itching or hives, swelling of the face, lips, or tongue °seizures °signs and symptoms of a blood clot such as breathing problems; changes in vision; chest pain; severe, sudden headache; pain, swelling, warmth in the leg; trouble speaking; sudden numbness or weakness of the face, arm or leg °signs and symptoms of a stroke like   changes in vision; confusion; trouble speaking or understanding; severe headaches; sudden numbness or weakness of the face, arm or leg; trouble walking; dizziness; loss of balance or coordination °Side effects that usually do not require medical attention (report to your doctor or health care professional if they continue or are  bothersome): °chills °cough °dizziness °fever °headaches °joint pain °muscle cramps °muscle pain °nausea, vomiting °pain, redness, or irritation at site where injected °This list may not describe all possible side effects. Call your doctor for medical advice about side effects. You may report side effects to FDA at 1-800-FDA-1088. °Where should I keep my medication? °Keep out of the reach of children. °Store in a refrigerator between 2 and 8 degrees C (36 and 46 degrees F). Do not freeze or shake. Throw away any unused portion if using a single-dose vial. Multi-dose vials can be kept in the refrigerator for up to 21 days after the initial dose. Throw away unused medicine. °NOTE: This sheet is a summary. It may not cover all possible information. If you have questions about this medicine, talk to your doctor, pharmacist, or health care provider. °© 2022 Elsevier/Gold Standard (2017-06-19 00:00:00) ° °

## 2022-01-18 NOTE — Telephone Encounter (Signed)
Critical lab received from [facility], RN stating that pt's creatine came back at 3.06. Lottie Dawson NP advised of results. ?

## 2022-01-19 ENCOUNTER — Encounter (HOSPITAL_BASED_OUTPATIENT_CLINIC_OR_DEPARTMENT_OTHER): Payer: PRIVATE HEALTH INSURANCE | Admitting: General Surgery

## 2022-01-19 DIAGNOSIS — Z89421 Acquired absence of other right toe(s): Secondary | ICD-10-CM | POA: Diagnosis not present

## 2022-01-19 DIAGNOSIS — L97514 Non-pressure chronic ulcer of other part of right foot with necrosis of bone: Secondary | ICD-10-CM | POA: Diagnosis not present

## 2022-01-19 DIAGNOSIS — L97524 Non-pressure chronic ulcer of other part of left foot with necrosis of bone: Secondary | ICD-10-CM | POA: Diagnosis not present

## 2022-01-19 DIAGNOSIS — M86672 Other chronic osteomyelitis, left ankle and foot: Secondary | ICD-10-CM | POA: Diagnosis not present

## 2022-01-19 DIAGNOSIS — E1142 Type 2 diabetes mellitus with diabetic polyneuropathy: Secondary | ICD-10-CM | POA: Diagnosis not present

## 2022-01-19 DIAGNOSIS — N184 Chronic kidney disease, stage 4 (severe): Secondary | ICD-10-CM | POA: Diagnosis not present

## 2022-01-19 DIAGNOSIS — E1169 Type 2 diabetes mellitus with other specified complication: Secondary | ICD-10-CM | POA: Diagnosis not present

## 2022-01-19 DIAGNOSIS — E11621 Type 2 diabetes mellitus with foot ulcer: Secondary | ICD-10-CM | POA: Diagnosis present

## 2022-01-19 DIAGNOSIS — I130 Hypertensive heart and chronic kidney disease with heart failure and stage 1 through stage 4 chronic kidney disease, or unspecified chronic kidney disease: Secondary | ICD-10-CM | POA: Diagnosis not present

## 2022-01-19 DIAGNOSIS — E1122 Type 2 diabetes mellitus with diabetic chronic kidney disease: Secondary | ICD-10-CM | POA: Diagnosis not present

## 2022-01-19 DIAGNOSIS — M797 Fibromyalgia: Secondary | ICD-10-CM | POA: Diagnosis not present

## 2022-01-19 DIAGNOSIS — I509 Heart failure, unspecified: Secondary | ICD-10-CM | POA: Diagnosis not present

## 2022-01-19 DIAGNOSIS — M86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, right ankle and foot: Secondary | ICD-10-CM | POA: Diagnosis not present

## 2022-01-19 LAB — IRON AND IRON BINDING CAPACITY (CC-WL,HP ONLY)
Iron: 46 ug/dL (ref 28–170)
Saturation Ratios: 22 % (ref 10.4–31.8)
TIBC: 213 ug/dL — ABNORMAL LOW (ref 250–450)
UIBC: 167 ug/dL (ref 148–442)

## 2022-01-19 LAB — FERRITIN: Ferritin: 549 ng/mL — ABNORMAL HIGH (ref 11–307)

## 2022-01-19 NOTE — Progress Notes (Signed)
JENISSA, TYRELL ([id number]) ?Visit Report for 01/19/2022 ?Chief Complaint Document Details ?Patient Name: Date of Service: ?[name] [name] RE, [name] 01/19/2022 3:45 PM ?Medical Record Number: [id number] ?Patient Account Number: [id number] ?Date of Birth/Sex: Treating RN: ?Jan 26, 1956 (66 y.o. [name] [name], [place] ?Primary Care Provider: [name] Other Clinician: ?Referring Provider: ?Treating Provider/Extender: Fredirick Maudlin ?[name] ?Weeks in Treatment: 7 ?Information Obtained from: Patient ?Chief Complaint ?Left heel ulcer ?01/10/2021; patient returns to clinic with 2 large wounds on her bilateral heels ?11/29/2021; patient returns to clinic with substantial wounds on the right foot and left heel. ?Electronic Signature(s) ?Signed: 01/19/2022 4:22:22 PM By: Fredirick Maudlin MD FACS ?Entered By: Fredirick Maudlin on 01/19/2022 16:22:22 ?-------------------------------------------------------------------------------- ?Debridement Details ?Patient Name: Date of Service: ?[name] [name] RE, [name] 01/19/2022 3:45 PM ?Medical Record Number: [id number] ?Patient Account Number: [id number] ?Date of Birth/Sex: Treating RN: ?Oct 04, 1956 (66 y.o. [name] [name], [place] ?Primary Care Provider: [name] Other Clinician: ?Referring Provider: ?Treating Provider/Extender: Fredirick Maudlin ?[name] ?Weeks in Treatment: 7 ?Debridement Performed for Assessment: Wound #15 Right,Lateral Foot ?Performed By: Physician Fredirick Maudlin, MD ?Debridement Type: Debridement ?Severity of Tissue Pre Debridement: Fat layer exposed ?Level of Consciousness (Pre-procedure): Awake and Alert ?Pre-procedure Verification/Time Out Yes - 16:10 ?Taken: ?Start Time: 16:10 ?T Area Debrided (L x W): ?otal 0.9 (cm) x 0.8 (cm) = 0.72 (cm?) ?Tissue and other material debrided: Non-Viable, Callus, Biofilm ?Level: Non-Viable Tissue ?Debridement Description: Selective/Open Wound ?Instrument: Curette ?Bleeding: Minimum ?Hemostasis Achieved:  Pressure ?End Time: 16:12 ?Procedural Pain: 0 ?Post Procedural Pain: 0 ?Response to Treatment: Procedure was tolerated well ?Level of Consciousness (Post- Awake and Alert ?procedure): ?Post Debridement Measurements of Total Wound ?Length: (cm) 0.9 ?Width: (cm) 0.8 ?Depth: (cm) 0.3 ?Volume: (cm?) 0.17 ?Character of Wound/Ulcer Post Debridement: Improved ?Severity of Tissue Post Debridement: Fat layer exposed ?Post Procedure Diagnosis ?Same as Pre-procedure ?Electronic Signature(s) ?Signed: 01/19/2022 5:06:24 PM By: Fredirick Maudlin MD FACS ?Signed: 01/19/2022 5:45:07 PM By: [name] RN, BSN ?Entered By: [name] on 01/19/2022 16:12:52 ?-------------------------------------------------------------------------------- ?Debridement Details ?Patient Name: ?Date of Service: ?[name] [name] RE, [name] 01/19/2022 3:45 PM ?Medical Record Number: [id number] ?Patient Account Number: [id number] ?Date of Birth/Sex: ?Treating RN: ?11-Nov-1955 (66 y.o. [name] [name], [place] ?Primary Care Provider: [name] ?Other Clinician: ?Referring Provider: ?Treating Provider/Extender: Fredirick Maudlin ?[name] ?Weeks in Treatment: 7 ?Debridement Performed for Assessment: Wound #12 Right,Plantar Foot ?Performed By: Physician Fredirick Maudlin, MD ?Debridement Type: Debridement ?Severity of Tissue Pre Debridement: Fat layer exposed ?Level of Consciousness (Pre-procedure): Awake and Alert ?Pre-procedure Verification/Time Out Yes - 16:10 ?Taken: ?Start Time: 16:12 ?T Area Debrided (L x W): ?otal 0.6 (cm) x 0.5 (cm) = 0.3 (cm?) ?Tissue and other material debrided: Non-Viable, Callus, Biofilm ?Level: Non-Viable Tissue ?Debridement Description: Selective/Open Wound ?Instrument: Curette ?Bleeding: Minimum ?Hemostasis Achieved: Pressure ?End Time: 16:14 ?Procedural Pain: 0 ?Post Procedural Pain: 0 ?Response to Treatment: Procedure was tolerated well ?Level of Consciousness (Post- Awake and Alert ?procedure): ?Post Debridement  Measurements of Total Wound ?Length: (cm) 0.6 ?Width: (cm) 0.5 ?Depth: (cm) 0.4 ?Volume: (cm?) 0.094 ?Character of Wound/Ulcer Post Debridement: Improved ?Severity of Tissue Post Debridement: Fat layer exposed ?Post Procedure Diagnosis ?Same as Pre-procedure ?Electronic Signature(s) ?Signed: 01/19/2022 5:06:24 PM By: Fredirick Maudlin MD FACS ?Signed: 01/19/2022 5:45:07 PM By: [name] RN, BSN ?Entered By: [name] on 01/19/2022 16:13:31 ?-------------------------------------------------------------------------------- ?Debridement Details ?Patient Name: ?Date of Service: ?[name] [name] RE, [name] 01/19/2022 3:45 PM ?Medical Record Number: [id number] ?Patient Account Number: [id number] ?Date of Birth/Sex: ?Treating RN: ?1956-03-21 (  66 y.o. [name] [name], [place] ?Primary Care Provider: [name] ?Other Clinician: ?Referring Provider: ?Treating Provider/Extender: Fredirick Maudlin ?[name] ?Weeks in Treatment: 7 ?Debridement Performed for Assessment: Wound #11 Left Calcaneus ?Performed By: Physician Fredirick Maudlin, MD ?Debridement Type: Debridement ?Severity of Tissue Pre Debridement: Fat layer exposed ?Level of Consciousness (Pre-procedure): Awake and Alert ?Pre-procedure Verification/Time Out Yes - 16:10 ?Taken: ?Start Time: 16:14 ?T Area Debrided (L x W): ?otal 3 (cm) x 2.4 (cm) = 7.2 (cm?) ?Tissue and other material debrided: Non-Viable, Callus, Fat, Slough, Biofilm, [place] ?Level: Skin/Subcutaneous Tissue ?Debridement Description: Excisional ?Instrument: Curette ?Bleeding: Minimum ?Hemostasis Achieved: Pressure ?End Time: 16:17 ?Procedural Pain: 0 ?Post Procedural Pain: 0 ?Response to Treatment: Procedure was tolerated well ?Level of Consciousness (Post- Awake and Alert ?procedure): ?Post Debridement Measurements of Total Wound ?Length: (cm) 3 ?Width: (cm) 2.4 ?Depth: (cm) 0.5 ?Volume: (cm?) 2.827 ?Character of Wound/Ulcer Post Debridement: Improved ?Severity of Tissue Post Debridement: Fat  layer exposed ?Post Procedure Diagnosis ?Same as Pre-procedure ?Electronic Signature(s) ?Signed: 01/19/2022 5:06:24 PM By: Fredirick Maudlin MD FACS ?Signed: 01/19/2022 5:45:07 PM By: [name] RN, BSN ?Entered By: [name] on 01/19/2022 16:15:12 ?-------------------------------------------------------------------------------- ?HPI Details ?Patient Name: ?Date of Service: ?[name] [name] RE, [name] 01/19/2022 3:45 PM ?Medical Record Number: [id number] ?Patient Account Number: [id number] ?Date of Birth/Sex: ?Treating RN: ?[birth year] (66 y.o. [name] [name], [place] ?Primary Care Provider: [name] ?Other Clinician: ?Referring Provider: ?Treating Provider/Extender: Fredirick Maudlin ?[name] ?Weeks in Treatment: 7 ?History of Present Illness ?HPI Description: ADMISSION ?11/12/2018 ?This is a 66 year old woman with type 2 diabetes and diabetic neuropathy. She has been dealing with a left heel plantar wound for roughly 2 years. She states ?this started when she pulled some skin off the area and it progressed into a wound. She also has an area on the tip of her left great toe for 1 year. She has ?been largely followed by Dr. Sharol Given and she had an excision of bone in the left heel in 2017. I do not see microbiology from this excision or pathology. ?Apparently this wound never really closed. She most recently has been using Silvadene cream. Offloading this with a scooter. The last MRI I see was in June ?2018 which did not show osteomyelitis at that time. Apparently this wound is never really progressed towards healing. During her last review by Dr. Sharol Given in ?October it was recommended that she undergo a left BKA and she refused. She went to see a second orthopedic consult at [facility] who ?am ?recommended conservative wound care to see if this will close or progress towards closure but also warned that possible surgery may be necessary. An x-ray ?that was done at [facility] showed an irregular calcaneal body and  calcaneal tuberosity. This is probably postprocedural. Previous x-rays at [facility] had suggested ?heterotrophic calcifications but I do not see this. Apparently a second ointment was added to the Silvadene which the patient thi

## 2022-01-19 NOTE — Progress Notes (Signed)
UNDREA, ARCHBOLD ([id number]) ?Visit Report for 01/19/2022 ?Arrival Information Details ?Patient Name: Date of Service: ?[name] [name] RE, [name] 01/19/2022 3:45 PM ?Medical Record Number: [id number] ?Patient Account Number: [id number] ?Date of Birth/Sex: Treating RN: ?1956/09/19 (66 y.o. [name] [name], [name] [name] ?Primary Care [place] Pulse: [name] [name] Other Clinician: ?Referring Nyaira Hodgens: ?Treating Ragnar Waas/Extender: Fredirick Maudlin ?[name] [name] ?Weeks in Treatment: 7 ?Visit Information History Since Last Visit ?Added or deleted any medications: No ?Patient Arrived: Wheel Chair ?Any new allergies or adverse reactions: No ?Arrival Time: 15:45 ?Had a fall or experienced change in No ?Accompanied By: husband ?activities of daily living that may affect ?Transfer Assistance: None ?risk of falls: ?Patient Identification Verified: Yes ?Signs or symptoms of abuse/neglect since last visito No ?Secondary Verification Process Completed: Yes ?Hospitalized since last visit: Yes ?Patient Requires Transmission-Based Precautions: No ?Implantable device outside of the clinic excluding No ?Patient Has Alerts: No ?cellular tissue based products placed in the center ?since last visit: ?Has Dressing in Place as Prescribed: Yes ?Pain Present Now: No ?Electronic Signature(s) ?Signed: 01/19/2022 5:45:07 PM By: [name] RN, BSN ?Entered By: [name] on 01/19/2022 15:47:23 ?-------------------------------------------------------------------------------- ?Encounter Discharge Information Details ?Patient Name: Date of Service: ?[name] [name] RE, [name] 01/19/2022 3:45 PM ?Medical Record Number: [id number] ?Patient Account Number: [id number] ?Date of Birth/Sex: Treating RN: ?06/07/1956 (66 y.o. [name] [name], [name] [name] ?Primary Care Honestee Revard: [name] [name] Other Clinician: ?Referring Deitrich Steve: ?Treating Jayvyn Haselton/Extender: Fredirick Maudlin ?[name] [name] ?Weeks in Treatment: 7 ?Encounter Discharge Information Items Post Procedure  Vitals ?Discharge Condition: Stable ?Temperature (F): 98.6 ?Ambulatory Status: Wheelchair ?Pulse (bpm): 66 ?Discharge Destination: Home ?Respiratory Rate (breaths/min): 18 ?Transportation: Private Auto ?Blood Pressure (mmHg): 121/71 ?Accompanied By: husband ?Schedule Follow-up Appointment: Yes ?Clinical Summary of Care: Patient Declined ?Electronic Signature(s) ?Signed: 01/19/2022 5:45:07 PM By: [name] RN, BSN ?Entered By: [name] on 01/19/2022 16:55:49 ?-------------------------------------------------------------------------------- ?Lower Extremity Assessment Details ?Patient Name: ?Date of Service: ?[name] [name] RE, [name] 01/19/2022 3:45 PM ?Medical Record Number: [id number] ?Patient Account Number: [id number] ?Date of Birth/Sex: ?Treating RN: ?02-04-1956 (66 y.o. [name] [name], [name] [name] ?Primary Care Monie Shere: [name] [name] ?Other Clinician: ?Referring Tristram Milian: ?Treating Judea Riches/Extender: Fredirick Maudlin ?[name] [name] ?Weeks in Treatment: 7 ?Edema Assessment ?Assessed: [Left: No] [Right: No] ?Edema: [Left: Yes] [Right: Yes] ?Calf ?Left: Right: ?Point of Measurement: 27 cm From Medial Instep 41 cm 42.5 cm ?Ankle ?Left: Right: ?Point of Measurement: 9 cm From Medial Instep 26.5 cm 28.6 cm ?Vascular Assessment ?Pulses: ?Dorsalis Pedis ?Palpable: [Left:Yes] [Right:Yes] ?Electronic Signature(s) ?Signed: 01/19/2022 5:45:07 PM By: [name] RN, BSN ?Entered By: [name] on 01/19/2022 16:05:51 ?-------------------------------------------------------------------------------- ?Multi Wound Chart Details ?Patient Name: ?Date of Service: ?[name] [name] RE, [name] 01/19/2022 3:45 PM ?Medical Record Number: [id number] ?Patient Account Number: [id number] ?Date of Birth/Sex: ?Treating RN: ?02-04-1956 (66 y.o. [name] [name], [name] [name] ?Primary Care Madell Heino: [name] [name] ?Other Clinician: ?Referring Cinnamon Morency: ?Treating Azalya Galyon/Extender: Fredirick Maudlin ?[name] [name] ?Weeks in Treatment: 7 ?Vital  Signs ?Height(in): 61 ?Pulse(bpm): 66 ?Weight(lbs): 169 ?Blood Pressure(mmHg): 121/71 ?Body Mass Index(BMI): 31.9 ?Temperature(??F): 98.6 ?Respiratory Rate(breaths/min): 18 ?Photos: [11:Left Calcaneus] [12:Right, Plantar Foot] [15:Right, Lateral Foot] ?Wound Location: [11:Gradually Appeared] [12:Gradually Appeared] [15:Gradually Appeared] ?Wounding Event: [11:Diabetic Wound/Ulcer of the Lower] [12:Diabetic Wound/Ulcer of the Lower] [15:Diabetic Wound/Ulcer of the Lower] ?Primary Etiology: [11:Extremity Cataracts, Anemia, Sleep Apnea,] [12:Extremity Cataracts, Anemia, Sleep Apnea,] [15:Extremity Cataracts, Anemia, Sleep Apnea,] ?Comorbid History: [11:Coronary Artery Disease, Hypertension, Myocardial Infarction, Hypertension, Myocardial Infarction, Hypertension, Myocardial Infarction, Type II Diabetes, Osteoarthritis, Osteomyelitis, Neuropathy 10/30/2020] [12:Coronary Artery  ?Disease, Type II Diabetes, Osteoarthritis, Osteomyelitis, Neuropathy  09/29/2021] [15:Coronary Artery Disease, Type II Diabetes, Osteoarthritis, Osteomyelitis, Neuropathy 09/29/2021] ?Date Acquired: [11:7] [12:7] [15:7] ?Weeks of Treatment: [11:Open] [12:Open] [15:Open] ?Wound Status: [11:No] [12:No] [15:No] ?Wound Recurrence: [11:3x2.4x0.5] [12:0.6x0.5x0.4] [15:0.9x0.8x0.3] ?Measurements L x W x D (cm) [11:5.655] [12:0.236] [15:0.565] ?A (cm?) : ?rea [11:2.827] [12:0.094] [15:0.17] ?Volume (cm?) : [11:48.90%] [12:86.60%] [15:79.60%] ?% Reduction in A [11:rea: 74.40%] [12:95.60%] [15:93.90%] ?% Reduction in Volume: [11:Grade 3] [12:Grade 3] [15:Grade 2] ?Classification: [11:Medium] [12:Medium] [15:Medium] ?Exudate A mount: [11:Serosanguineous] [12:Serosanguineous] [15:Serosanguineous] ?Exudate Type: [11:red, brown] [12:red, brown] [15:red, brown] ?Exudate Color: [11:Flat and Intact] [12:Thickened] [15:Flat and Intact] ?Wound Margin: [11:Small (1-33%)] [12:Large (67-100%)] [15:Large (67-100%)] ?Granulation A mount: [11:Pink, Pale] [12:Red, Pink]  [15:Red, Pink] ?Granulation Quality: [11:Large (67-100%)] [12:Small (1-33%)] [15:Small (1-33%)] ?Necrotic A mount: ?[11:Fat Layer (Subcutaneous Tissue): Yes Fat Layer (Subcutaneous Tissue): Yes Fat Layer (Subcutaneous Tissue): Yes] ?Exposed Structures: ?[11:Fascia: No Tendon: No Muscle: No Joint: No Bone: No None] [12:Fascia: No Tendon: No Muscle: No Joint: No Bone: No Medium (34-66%)] [15:Fascia: No Tendon: No Muscle: No Joint: No Bone: No Small (1-33%)] ?Epithelialization: [11:Debridement - Excisional] [12:Debridement - Selective/Open Wound Debridement - Selective/Open Wound] ?Debridement: ?Pre-procedure Verification/Time Out 16:10 [12:16:10] [15:16:10] ?Taken: [11:Fat, Callus, Slough] [12:Callus] [15:Callus] ?Tissue Debrided: [11:Skin/Subcutaneous Tissue] [12:Non-Viable Tissue] [15:Non-Viable Tissue] ?Level: [11:7.2] [12:0.3] [15:0.72] ?Debridement A (sq cm): [11:rea Curette] [12:Curette] [15:Curette] ?Instrument: [11:Minimum] [12:Minimum] [15:Minimum] ?Bleeding: [11:Pressure] [12:Pressure] [15:Pressure] ?Hemostasis A chieved: [11:0] [12:0] [15:0] ?Procedural Pain: [11:0] [12:0] [15:0] ?Post Procedural Pain: [11:Procedure was tolerated well] [12:Procedure was tolerated well] [15:Procedure was tolerated well] ?Debridement Treatment Response: [11:3x2.4x0.5] [12:0.6x0.5x0.4] [15:0.9x0.8x0.3] ?Post Debridement Measurements L x ?W x D (cm) [11:2.827] [12:0.094] [15:0.17] ?Post Debridement Volume: (cm?) [11:Debridement] [12:Debridement] [15:Debridement] ?Treatment Notes ?Electronic Signature(s) ?Signed: 01/19/2022 4:22:07 PM By: Fredirick Maudlin MD FACS ?Signed: 01/19/2022 5:45:07 PM By: [name] RN, BSN ?Entered By: Fredirick Maudlin on 01/19/2022 16:22:07 ?-------------------------------------------------------------------------------- ?Multi-Disciplinary Care Plan Details ?Patient Name: ?Date of Service: ?[name] [name] RE, [name] 01/19/2022 3:45 PM ?Medical Record Number: [id number] ?Patient Account Number:  [id number] ?Date of Birth/Sex: ?Treating RN: ?[birth year] (66 y.o. [name] [name], [name] [name] ?Primary Care Murl Zogg: [name] [name] ?Other Clinician: ?Referring Lorik Guo: ?Treating Christofer Shen/Extender: Fredirick Maudlin ?[name] [name] ?[place]

## 2022-01-20 ENCOUNTER — Encounter: Admit: 2022-01-20 | Discharge: 2022-01-20 | Payer: MEDICARE

## 2022-01-20 MED ORDER — BETAMETHASONE VALERATE 0.1 % TP CREA
1 refills | 30.00000 days | Status: AC
Start: 2022-01-20 — End: ?

## 2022-01-20 MED ORDER — AMLODIPINE 10 MG PO TAB
ORAL_TABLET | 3 refills | Status: AC
Start: 2022-01-20 — End: ?

## 2022-01-24 ENCOUNTER — Other Ambulatory Visit: Payer: Self-pay

## 2022-01-24 ENCOUNTER — Ambulatory Visit: Payer: PRIVATE HEALTH INSURANCE | Admitting: *Deleted

## 2022-01-24 ENCOUNTER — Ambulatory Visit (INDEPENDENT_AMBULATORY_CARE_PROVIDER_SITE_OTHER): Payer: PRIVATE HEALTH INSURANCE | Admitting: Internal Medicine

## 2022-01-24 DIAGNOSIS — E11628 Type 2 diabetes mellitus with other skin complications: Secondary | ICD-10-CM | POA: Diagnosis not present

## 2022-01-24 DIAGNOSIS — N1832 Chronic kidney disease, stage 3b: Secondary | ICD-10-CM

## 2022-01-24 DIAGNOSIS — E1142 Type 2 diabetes mellitus with diabetic polyneuropathy: Secondary | ICD-10-CM

## 2022-01-24 DIAGNOSIS — R197 Diarrhea, unspecified: Secondary | ICD-10-CM | POA: Diagnosis not present

## 2022-01-24 DIAGNOSIS — N179 Acute kidney failure, unspecified: Secondary | ICD-10-CM

## 2022-01-24 DIAGNOSIS — E785 Hyperlipidemia, unspecified: Secondary | ICD-10-CM

## 2022-01-24 DIAGNOSIS — E1165 Type 2 diabetes mellitus with hyperglycemia: Secondary | ICD-10-CM

## 2022-01-24 DIAGNOSIS — E042 Nontoxic multinodular goiter: Secondary | ICD-10-CM

## 2022-01-24 DIAGNOSIS — I252 Old myocardial infarction: Secondary | ICD-10-CM

## 2022-01-24 DIAGNOSIS — L089 Local infection of the skin and subcutaneous tissue, unspecified: Secondary | ICD-10-CM

## 2022-01-24 DIAGNOSIS — K7030 Alcoholic cirrhosis of liver without ascites: Secondary | ICD-10-CM

## 2022-01-24 DIAGNOSIS — E669 Obesity, unspecified: Secondary | ICD-10-CM

## 2022-01-24 DIAGNOSIS — D508 Other iron deficiency anemias: Secondary | ICD-10-CM

## 2022-01-24 DIAGNOSIS — F418 Other specified anxiety disorders: Secondary | ICD-10-CM

## 2022-01-24 NOTE — Patient Instructions (Signed)
Visit Information ? ?Thank you for taking time to visit with me today. Please don't hesitate to contact me if I can be of assistance to you before our next scheduled telephone appointment. ? ?Following are the goals we discussed today:  ?Patient Goals/Self-Care Activities: ?Continue to receive personal counseling with LCSW, on a bi-weekly basis, to reduce and manage symptoms of Anxiety, Depression, and Post-Traumatic Stress Disorder, until well-managed.   ?Continue to incorporate into daily practice - relaxation techniques, deep breathing exercises, and mindfulness meditation strategies.  ?Review EMMI Educational Material on, "Depression in Adults", "Generalized Anxiety Disorder", and "Post-Traumatic Stress Disorder", and be prepared to discuss during our next scheduled telephone follow-up outreach call. ?Contact LCSW directly (# [phone]), if you have questions, need assistance, or if additional social work needs are identified.   ?Follow-Up Date:  02/07/2022 at 11:45 am ? ?Please call the care guide team at [phone] if you need to cancel or reschedule your appointment.  ? ?If you are experiencing a Mental Health or [place] or need someone to talk to, please call the Suicide and Crisis Lifeline: 988 ?call the [place] National Suicide Prevention Lifeline: (737)347-4052 or TTY: 915-457-2299 TTY (618)667-7283) to talk to a trained counselor ?call 1-800-273-TALK (toll free, 24 hour hotline) ?go to [facility] [address], [place] [id number]) ?call the [facility]: [phone] ?call 911  ? ?Patient verbalizes understanding of instructions and care plan provided today and agrees to view in [place]. Active MyChart status confirmed with patient.   ? ?Nat Christen LCSW ?Licensed Clinical Social Worker ?[place]

## 2022-01-24 NOTE — Assessment & Plan Note (Signed)
She has had a 66-monthcourse of broad-spectrum antibiotic therapy.  Her wounds are looking much better.  She is currently not able to tolerate full dose antibiotic therapy.  I instructed her to stop her antibiotics now.  She will follow-up in 4 weeks. ?

## 2022-01-24 NOTE — Assessment & Plan Note (Signed)
I suspect that her recent, intermittent diarrhea is multifactorial but she believes it is due to full dose metronidazole.  It has improved with reduction in the dose.  I am stopping her metronidazole now. ?

## 2022-01-24 NOTE — Assessment & Plan Note (Signed)
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  Her most recent hemoglobin was low but stable.  I am going to leave her central line and at least until she returns to the cancer center in 2 weeks.  If she is not needing a transfusion at the time of that visit I would recommend having her central line removed. ?

## 2022-01-24 NOTE — Chronic Care Management (AMB) (Signed)
?Chronic Care Management  ? ? Clinical Social Work Note ? ?01/24/2022 ?Name: [name] [name] MRN: [id number] DOB: [birth year] ? ?[name] [name] is a 66 y.o. year old adult who is a primary care patient of [name] Held, DO. The CCM team was consulted to assist the patient with chronic disease management and/or care coordination needs related to: [employer], Mental Health Counseling and Resources, and Caregiver Stress. ? ?Engaged with patient by telephone for follow up visit in response to provider referral for social work chronic care management and care coordination services.  ? ?Consent to Services:  ?The patient was given information about Chronic Care Management services, agreed to services, and gave verbal consent prior to initiation of services.  Please see initial visit note for detailed documentation.  ? ?Patient agreed to services and consent obtained.  ? ?Assessment: Review of patient past medical history, allergies, medications, and health status, including review of relevant consultants reports was performed today as part of a comprehensive evaluation and provision of chronic care management and care coordination services.    ? ?SDOH (Social Determinants of Health) assessments and interventions performed:   ? ?Advanced Directives Status: Not addressed in this encounter. ? ?CCM Care Plan ? ?Allergies  ?Allergen Reactions  ? Bee Pollen Anaphylaxis  ? Fish-Derived Products Hives, Shortness Of Breath, Swelling and Rash  ?  Hives get in throat causing trouble breathing  ? Mushroom Extract Complex Anaphylaxis  ? Penicillins Anaphylaxis  ?  **Tolerated cefepime March 2021 ?Did it involve swelling of the face/tongue/throat, SOB, or low BP? Yes ?Did it involve sudden or severe rash/hives, skin peeling, or any reaction on the inside of your mouth or nose? No ?Did you need to seek medical attention at a hospital or doctor's office? Yes ?When did it last happen?  A few months ago ?If all above  answers are "NO", may proceed with cephalosporin use.  ? Rosemary Oil Anaphylaxis  ? Shellfish Allergy Hives, Shortness Of Breath, Swelling and Rash  ? Tomato Hives and Shortness Of Breath  ?  Hives in throat causes her trouble breathing  ? Acetaminophen Other (See Comments)  ?  GI upset  ? Aloe Vera Hives  ? Broccoli [Brassica Oleracea] Hives  ? Acyclovir And Related Other (See Comments)  ?  Unknown reaction  ? Naproxen Other (See Comments)  ?  Unknown reaction  ? ? ?Outpatient Encounter Medications as of 01/24/2022  ?Medication Sig Note  ? acetaminophen (TYLENOL) 325 MG tablet Take 2 tablets (650 mg total) by mouth every 6 (six) hours as needed for mild pain, fever or headache (or Fever >/= 101). Discontinue 500/1000 mg dosing   ? apixaban (ELIQUIS) 5 MG TABS tablet Take 1 tablet (5 mg total) by mouth 2 (two) times daily.   ? atorvastatin (LIPITOR) 10 MG tablet Take 1 tablet (10 mg total) by mouth daily.   ? carvedilol (COREG) 6.25 MG tablet Take 1 tablet (6.25 mg total) by mouth 2 (two) times daily with a meal.   ? cefUROXime (CEFTIN) 250 MG tablet Take 1 tablet (250 mg total) by mouth 2 (two) times daily with a meal. 01/08/2022: 11/29/21 #60 x 30 days. Per spouse, there are only a couple tabs left, suggesting the patient is nearly finished with this 30 day course.  ? Continuous Blood Gluc Receiver (FREESTYLE LIBRE 14 DAY READER) DEVI 1 Device by Does not apply route daily.   ? Continuous Blood Gluc Sensor (FREESTYLE LIBRE 14 DAY SENSOR) MISC 1 Device  by Does not apply route daily.   ? diazepam (VALIUM) 5 MG tablet Take 1 tablet (5 mg total) by mouth every 12 (twelve) hours as needed for anxiety.   ? DULoxetine (CYMBALTA) 60 MG capsule Take 1 capsule by mouth once daily (Patient taking differently: Take 60 mg by mouth daily.)   ? ferrous sulfate 325 (65 FE) MG tablet Take 1 tablet (325 mg total) by mouth 2 (two) times daily with a meal.   ? folic acid (FOLVITE) 1 MG tablet Take 2 tablets by mouth once daily  (Patient taking differently: Take 2 mg by mouth daily.) 01/08/2022: Pt's husband reported that the pt has been out of this medication for roughly a week, and is in need of more refills.  ? furosemide (LASIX) 20 MG tablet Take 2 tablets (40 mg total) by mouth daily.   ? HYDROcodone-acetaminophen (NORCO/VICODIN) 5-325 MG tablet Take 1 tablet by mouth every 6 (six) hours as needed for moderate pain.   ? insulin glargine (LANTUS SOLOSTAR) 100 UNIT/ML Solostar Pen INJECT 20 UNITS SUBCUTANEOUSLY IN THE MORNING AND 40 AT NIGHT (Patient taking differently: 15-18 Units at bedtime.)   ? metroNIDAZOLE (FLAGYL) 500 MG tablet Take 1 tablet (500 mg total) by mouth 3 (three) times daily. 01/08/2022: 01/06/22 #90 x 30 days. Pt's husband reported that the patient just started this course on 03/10. Thus meaning the patient has 28 days remaining.  ? montelukast (SINGULAIR) 10 MG tablet TAKE 1 TABLET BY MOUTH AT BEDTIME (Patient taking differently: Take 10 mg by mouth at bedtime.) 01/08/2022: Spouse reports that the pt has been out of this medication and needs more refills. PCP will not grant these refills and has not expressed as to why. Per spouse, pt needs this medication still.  ? Multiple Vitamin (MULTIVITAMIN WITH MINERALS) TABS tablet Take 1 tablet by mouth daily.   ? NONFORMULARY OR COMPOUNDED ITEM Power wheelchair  #1  as directed   ? ondansetron (ZOFRAN) 8 MG tablet Take 0.5 tablets (4 mg total) by mouth every 8 (eight) hours as needed for nausea or vomiting. (Patient taking differently: Take 4-8 mg by mouth every 8 (eight) hours as needed for nausea or vomiting.) 01/08/2022: Spouse reported that the pt is all out of this medication and in need of refills.  ? pregabalin (LYRICA) 75 MG capsule Take 1 capsule (75 mg total) by mouth 2 (two) times daily.   ? VITAMIN D PO Take 125 mcg by mouth daily.   ? [DISCONTINUED] metFORMIN (GLUCOPHAGE) 1000 MG tablet Take 1,000 mg by mouth 2 (two) times daily with a meal.     ? [DISCONTINUED]  omeprazole (PRILOSEC) 20 MG capsule Take 20 mg by mouth daily.     ? ?No facility-administered encounter medications on file as of 01/24/2022.  ? ? ?Patient Active Problem List  ? Diagnosis Date Noted  ? Acute CHF (congestive heart failure) ([place]) 01/07/2022  ? Acute on chronic diastolic CHF (congestive heart failure) ([place]) 01/07/2022  ? Prolonged QT interval 01/07/2022  ? Cellulitis 01/07/2022  ? Streptococcal bacteremia   ? Severe protein-calorie malnutrition ([place])   ? Severe sepsis ([place])   ? Osteomyelitis of left foot ([place]) 10/16/2021  ? Cellulitis of right foot 10/16/2021  ? Hyponatremia 10/16/2021  ? Hyperkalemia 10/16/2021  ? Acute kidney injury superimposed on CKD ([place]) 10/16/2021  ? Leukocytosis 10/16/2021  ? Diabetic foot ulcers ([place]) 10/16/2021  ? DM (diabetes mellitus), type 2 with renal complications ([place]) 12/87/8676  ? Generalized weakness 10/16/2021  ?  Portal hypertension ([place]) 08/16/2021  ? 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 Eczema of scalp 06/27/2021  ? Hypothyroidism 06/27/2021  ? Pruritus 06/27/2021  ? Seasonal allergic rhinitis due to pollen 02/02/2021  ? CRF (chronic renal failure), stage 4 (severe) ([place]) 02/02/2021  ? PTSD (post-traumatic stress disorder)   ? Osteoarthritis   ? MRSA (methicillin resistant Staphylococcus aureus)   ? Hypertension   ? High cholesterol   ? Hepatic steatosis   ? GERD (gastroesophageal reflux disease)   ? Fibromyalgia   ? Fatty liver   ? Exertional shortness of breath   ? Episode of visual loss of left eye   ? Depression   ? Dehiscence of closure of skin   ? Constipation   ? Chronic kidney disease   ? Cataract   ? Bulging lumbar disc   ? Asthma   ? Anxiety   ? Anginal pain ([place])   ? Anemia   ? Tinea capitis 10/11/2020  ? Tinea corporis 10/11/2020  ? SOB (shortness of breath) 10/11/2020  ? Hip pain 05/16/2020  ? IDA (iron deficiency anemia) 05/04/2020  ? 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  S/P lumbar fusion 03/04/2020  ? Spinal stenosis of lumbar region 01/22/2020  ? Osteomyelitis of vertebra of thoracolumbar region [facility]) 01/21/2020  ? Compression fracture of L1 lumbar vertebra ([place]) 11/17/2019  ? Fr

## 2022-01-24 NOTE — Progress Notes (Signed)
?  ? ? ? ? ?[facility] ? ?Patient Active Problem List  ? Diagnosis Date Noted  ? Streptococcal bacteremia   ?  Priority: High  ? IDA (iron deficiency anemia) 05/04/2020  ?  Priority: High  ? Diarrhea 09/04/2019  ?  Priority: High  ? Diabetic foot infection ([place]) 04/06/2017  ?  Priority: High  ? Acute CHF (congestive heart failure) ([place]) 01/07/2022  ? Acute on chronic diastolic CHF (congestive heart failure) ([place]) 01/07/2022  ? Prolonged QT interval 01/07/2022  ? Cellulitis 01/07/2022  ? Severe protein-calorie malnutrition ([place])   ? Severe sepsis ([place])   ? Osteomyelitis of left foot ([place]) 10/16/2021  ? Cellulitis of right foot 10/16/2021  ? Hyponatremia 10/16/2021  ? Hyperkalemia 10/16/2021  ? Acute kidney injury superimposed on CKD ([place]) 10/16/2021  ? Leukocytosis 10/16/2021  ? Diabetic foot ulcers ([place]) 10/16/2021  ? DM (diabetes mellitus), type 2 with renal complications ([place]) 95/62/1308  ? Generalized weakness 10/16/2021  ? Portal hypertension ([place]) 08/16/2021  ? 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[place]) 08/16/2021  ? Eczema of scalp 06/27/2021  ? Hypothyroidism 06/27/2021  ? Pruritus 06/27/2021  ? Seasonal allergic rhinitis due to pollen 02/02/2021  ? CRF (chronic renal failure), stage 4 (severe) ([place]) 02/02/2021  ? PTSD (post-traumatic stress disorder)   ? Osteoarthritis   ? MRSA (methicillin resistant Staphylococcus aureus)   ? Hypertension   ? High cholesterol   ? Hepatic steatosis   ? GERD (gastroesophageal reflux disease)   ? Fibromyalgia   ? Fatty liver   ? Exertional shortness of breath   ? Episode of visual loss of left eye   ? Depression   ? Dehiscence of closure of skin   ? Constipation   ? Chronic kidney disease   ? Cataract   ? Bulging lumbar disc   ? Asthma   ? Anxiety   ? Anginal pain ([place])   ? Anemia   ? Tinea capitis 10/11/2020  ? Tinea corporis 10/11/2020  ? SOB (shortness of breath) 10/11/2020  ? Hip pain 05/16/2020  ? 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 S/P lumbar fusion 03/04/2020  ? Spinal stenosis of lumbar region 01/22/2020  ? Osteomyelitis of vertebra of thoracolumbar region [facility]) 01/21/2020  ? Compression fracture of L1 lumbar vertebra ([place]) 11/17/2019  ? Fracture of multiple ribs 11/17/2019  ? Pressure injury of skin 04/17/2019  ? Septic arthritis of elbow, right ([place]) 04/17/2019  ? Retinopathy 04/17/2019  ? 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 Sleep apnea 11/16/2017  ? Type 2 diabetes mellitus with hyperglycemia, with long-term current use of insulin ([place]) 04/06/2017  ? Neuropathy 05/05/2015  ? Renal insufficiency 05/05/2015  ? Diabetic peripheral neuropathy ([place]) 01/12/2015  ? CKD (chronic kidney disease), stage III ([place]) 05/27/2014  ? History of MI (myocardial infarction) 10/06/2013  ? Nausea and vomiting 10/06/2013  ? Obesity (BMI 30-39.9) 09/20/2013  ? 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 Normocytic anemia 02/03/2013  ? Proximal humerus fracture 10/15/2012  ? CAD (coronary artery disease) 01/11/2012  ? 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  ? Hyperlipidemia 06/03/2008  ? Essential hypertension 12/23/2006  ? Acute MI [facility]) 2007  ? ? ?Patient's Medications  ?New Prescriptions  ? No medications on file  ?Previous Medications  ? ACETAMINOPHEN (TYLENOL) 325 MG TABLET    Take 2 tablets (650 mg total) by mouth every 6 (six) hours as needed for mild pain, fever or  headache (or Fever >/= 101). Discontinue 500/1000 mg dosing  ? APIXABAN (ELIQUIS) 5 MG TABS TABLET    Take 1 tablet (5 mg total) by mouth 2 (two) times daily.  ? ATORVASTATIN (LIPITOR) 10 MG TABLET    Take 1 tablet (10 mg total) by mouth daily.  ? CARVEDILOL (COREG) 6.25 MG TABLET    Take 1 tablet (6.25 mg total) by mouth 2 (two) times daily with a meal.  ? CONTINUOUS BLOOD GLUC RECEIVER (FREESTYLE LIBRE 14 DAY READER) DEVI    1 Device by Does not apply route daily.  ? CONTINUOUS BLOOD GLUC SENSOR (FREESTYLE LIBRE 14  DAY SENSOR) MISC    1 Device by Does not apply route daily.  ? DIAZEPAM (VALIUM) 5 MG TABLET    Take 1 tablet (5 mg total) by mouth every 12 (twelve) hours as needed for anxiety.  ? DULOXETINE (CYMBALTA) 60 MG CAPSULE    Take 1 capsule by mouth once daily  ? FERROUS SULFATE 325 (65 FE) MG TABLET    Take 1 tablet (325 mg total) by mouth 2 (two) times daily with a meal.  ? FOLIC ACID (FOLVITE) 1 MG TABLET    Take 2 tablets by mouth once daily  ? FUROSEMIDE (LASIX) 20 MG TABLET    Take 2 tablets (40 mg total) by mouth daily.  ? HYDROCODONE-ACETAMINOPHEN (NORCO/VICODIN) 5-325 MG TABLET    Take 1 tablet by mouth every 6 (six) hours as needed for moderate pain.  ? INSULIN GLARGINE (LANTUS SOLOSTAR) 100 UNIT/ML SOLOSTAR PEN    INJECT 20 UNITS SUBCUTANEOUSLY IN THE MORNING AND 40 AT NIGHT  ? MONTELUKAST (SINGULAIR) 10 MG TABLET    TAKE 1 TABLET BY MOUTH AT BEDTIME  ? MULTIPLE VITAMIN (MULTIVITAMIN WITH MINERALS) TABS TABLET    Take 1 tablet by mouth daily.  ? NONFORMULARY OR COMPOUNDED ITEM    Power wheelchair  #1  as directed  ? ONDANSETRON (ZOFRAN) 8 MG TABLET    Take 0.5 tablets (4 mg total) by mouth every 8 (eight) hours as needed for nausea or vomiting.  ? PREGABALIN (LYRICA) 75 MG CAPSULE    Take 1 capsule (75 mg total) by mouth 2 (two) times daily.  ? VITAMIN D PO    Take 125 mcg by mouth daily.  ?Modified Medications  ? No medications on file  ?Discontinued Medications  ? CEFUROXIME (CEFTIN) 250 MG TABLET    Take 1 tablet (250 mg total) by mouth 2 (two) times daily with a meal.  ? METRONIDAZOLE (FLAGYL) 500 MG TABLET    Take 1 tablet (500 mg total) by mouth 3 (three) times daily.  ? ? ?Subjective: ?[name] [name] is in for her routine follow-up visit.  I followed her for many years for a variety of different infections.  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  She developed a Pseudomonas vertebral infection that appears to have been cured.  She  has had a chronic plantar ulcer under her left heel for many years.  She has been treated for infection there on multiple occasions.  Her last visit with me was in November 2021.  She was readmitted to the hospital in December with sepsis and streptococcal intermedius bacteremia.  Plain x-rays suggested deep infection of both feet.  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  She refused bilateral BKA's.  She was seen by my partners and discharged on IV ceftriaxone and  oral doxycycline.  She completed that on 11/29/2021 before switching to oral cefuroxime and metronidazole.   ? ?She continues to be followed at the wound center.  Her most recent notes indicate improvement in her multiple foot wounds.  She feels like she is doing better.  However, she has had continued problems tolerating metronidazole.  She says that she was having frequent watery diarrhea until she decreased her metronidazole from 3 times daily to 1 times daily 3 weeks ago.  She is no longer having any diarrhea. ? ?She was recently hospitalized with CHF.  Her hemoglobin was stable at 7.7 g but she was not given a transfusion.  She tells me that the last time she had a red blood cell transfusion she had a transfusion reaction.  Her central line remains in place.  She follows up at the cancer center in 2 weeks. ? ?Review of Systems: ?Review of Systems  ?Constitutional:  Negative for fever.  ?Gastrointestinal:  Negative for abdominal pain, diarrhea, nausea and vomiting.  ?Musculoskeletal:  Negative for joint pain.  ? ?Past Medical History:  ?Diagnosis Date  ? Acute MI [facility]) 2007  ? presented to ED & had cardiac cath- but found to have normal coronaries. Since that point in time her PCP cares f or cardiac needs. Dr. [name] [facility] [place]  ? Anemia   ? Anginal pain ([place])   ? Anxiety   ? Asthma   ? Back pain 11/17/2019  ? Bulging lumbar disc   ? CAD (coronary artery disease) 01/11/2012  ? Cataract   ? Chronic deep vein thrombosis (DVT) of  both lower extremities ([place]) 03/16/2020  ? Chronic kidney disease   ? "had transplant when I was 15; doesn't bother me now" (03/20/2013)  ? Cirrhosis of liver without mention of alcohol   ? Constipation   ? D

## 2022-01-25 ENCOUNTER — Encounter (HOSPITAL_BASED_OUTPATIENT_CLINIC_OR_DEPARTMENT_OTHER): Payer: PRIVATE HEALTH INSURANCE | Admitting: General Surgery

## 2022-01-25 DIAGNOSIS — E11621 Type 2 diabetes mellitus with foot ulcer: Secondary | ICD-10-CM | POA: Diagnosis not present

## 2022-01-25 NOTE — Progress Notes (Addendum)
SAJE, GALLOP ([id number]) ?Visit Report for 01/25/2022 ?Chief Complaint Document Details ?Patient Name: Date of Service: ?[name] [name] RE, [name] 01/25/2022 3:15 PM ?Medical Record Number: [id number] ?Patient Account Number: [id number] ?Date of Birth/Sex: Treating RN: ?Jan 26, 1956 (66 y.o. F) ?Primary Care Provider: [name] Other Clinician: ?Referring Provider: ?Treating Provider/Extender: Fredirick Maudlin ?[name] ?Weeks in Treatment: 8 ?Information Obtained from: Patient ?Chief Complaint ?Left heel ulcer ?01/10/2021; patient returns to clinic with 2 large wounds on her bilateral heels ?11/29/2021; patient returns to clinic with substantial wounds on the right foot and left heel. ?Electronic Signature(s) ?Signed: 01/25/2022 4:33:37 PM By: Fredirick Maudlin MD FACS ?Entered By: Fredirick Maudlin on 01/25/2022 16:33:36 ?-------------------------------------------------------------------------------- ?Debridement Details ?Patient Name: Date of Service: ?[name] [name] RE, [name] 01/25/2022 3:15 PM ?Medical Record Number: [id number] ?Patient Account Number: [id number] ?Date of Birth/Sex: Treating RN: ?05-Mar-1956 (66 y.o. F) [place], Mechele Claude ?Primary Care Provider: [name] Other Clinician: ?Referring Provider: ?Treating Provider/Extender: Fredirick Maudlin ?[name] ?Weeks in Treatment: 8 ?Debridement Performed for Assessment: Wound #15 Right,Lateral Foot ?Performed By: Physician Fredirick Maudlin, MD ?Debridement Type: Debridement ?Severity of Tissue Pre Debridement: Fat layer exposed ?Level of Consciousness (Pre-procedure): Awake and Alert ?Pre-procedure Verification/Time Out Yes - 14:37 ?Taken: ?Start Time: 14:37 ?Pain Control: ?Other : benzocaine 20% spray ?T Area Debrided (L x W): ?otal 0.5 (cm) x 0.3 (cm) = 0.15 (cm?) ?Tissue and other material debrided: Non-Viable, Slough, Subcutaneous, Fibrin/Exudate, [place] ?Level: Skin/Subcutaneous Tissue ?Debridement Description: Excisional ?Instrument:  Curette ?Bleeding: Minimum ?Hemostasis Achieved: Pressure ?End Time: 14:39 ?Procedural Pain: 0 ?Post Procedural Pain: 0 ?Response to Treatment: Procedure was tolerated well ?Level of Consciousness (Post- Awake and Alert ?procedure): ?Post Debridement Measurements of Total Wound ?Length: (cm) 0.5 ?Width: (cm) 0.3 ?Depth: (cm) 0.2 ?Volume: (cm?) 0.024 ?Character of Wound/Ulcer Post Debridement: Improved ?Severity of Tissue Post Debridement: Fat layer exposed ?Post Procedure Diagnosis ?Same as Pre-procedure ?Electronic Signature(s) ?Signed: 01/25/2022 4:57:02 PM By: Fredirick Maudlin MD FACS ?Signed: 01/25/2022 5:27:13 PM By: Dellie Catholic RN ?Entered By: Dellie Catholic on 01/25/2022 16:40:32 ?-------------------------------------------------------------------------------- ?Debridement Details ?Patient Name: ?Date of Service: ?[name] [name] RE, [name] 01/25/2022 3:15 PM ?Medical Record Number: [id number] ?Patient Account Number: [id number] ?Date of Birth/Sex: ?Treating RN: ?07-16-1956 (66 y.o. F) [place], Mechele Claude ?Primary Care Provider: [name] ?Other Clinician: ?Referring Provider: ?Treating Provider/Extender: Fredirick Maudlin ?[name] ?Weeks in Treatment: 8 ?Debridement Performed for Assessment: Wound #12 Right,Plantar Foot ?Performed By: Physician Fredirick Maudlin, MD ?Debridement Type: Debridement ?Severity of Tissue Pre Debridement: Fat layer exposed ?Level of Consciousness (Pre-procedure): Awake and Alert ?Pre-procedure Verification/Time Out Yes - 14:37 ?Taken: ?Start Time: 14:37 ?Pain Control: ?Other : benzocaine 20% spray ?T Area Debrided (L x W): ?otal 0.3 (cm) x 0.3 (cm) = 0.09 (cm?) ?Tissue and other material debrided: Non-Viable, Callus, Slough, Subcutaneous, Fibrin/Exudate, Slough ?Level: Skin/Subcutaneous Tissue ?Debridement Description: Excisional ?Instrument: Curette ?Bleeding: Minimum ?Hemostasis Achieved: Pressure ?End Time: 14:39 ?Procedural Pain: 0 ?Post Procedural Pain: 0 ?Response to  Treatment: Procedure was tolerated well ?Level of Consciousness (Post- Awake and Alert ?procedure): ?Post Debridement Measurements of Total Wound ?Length: (cm) 0.3 ?Width: (cm) 0.3 ?Depth: (cm) 0.3 ?Volume: (cm?) 0.021 ?Character of Wound/Ulcer Post Debridement: Improved ?Severity of Tissue Post Debridement: Fat layer exposed ?Post Procedure Diagnosis ?Same as Pre-procedure ?Electronic Signature(s) ?Signed: 01/25/2022 4:57:02 PM By: Fredirick Maudlin MD FACS ?Signed: 01/25/2022 5:27:13 PM By: Dellie Catholic RN ?Entered By: Dellie Catholic on 01/25/2022 16:42:39 ?-------------------------------------------------------------------------------- ?Debridement Details ?Patient Name: ?Date of Service: ?[name] [name] RE, [name] 01/25/2022 3:15 PM ?Medical  Record Number: [id number] ?Patient Account Number: [id number] ?Date of Birth/Sex: ?Treating RN: ?[birth year] (66 y.o. F) [place], Mechele Claude ?Primary Care Provider: [name] ?Other Clinician: ?Referring Provider: ?Treating Provider/Extender: Fredirick Maudlin ?[name] ?Weeks in Treatment: 8 ?Debridement Performed for Assessment: Wound #11 Left Calcaneus ?Performed By: Physician Fredirick Maudlin, MD ?Debridement Type: Debridement ?Severity of Tissue Pre Debridement: Fat layer exposed ?Level of Consciousness (Pre-procedure): Awake and Alert ?Pre-procedure Verification/Time Out Yes - 14:37 ?Taken: ?Start Time: 14:37 ?Pain Control: ?Other : benzocaine 20% spray ?T Area Debrided (L x W): ?otal 2.6 (cm) x 2.2 (cm) = 5.72 (cm?) ?Tissue and other material debrided: Non-Viable, Callus, Slough, Subcutaneous, Fibrin/Exudate, Slough ?Level: Skin/Subcutaneous Tissue ?Debridement Description: Excisional ?Instrument: Curette ?Bleeding: Minimum ?Hemostasis Achieved: Pressure ?End Time: 14:39 ?Procedural Pain: 0 ?Post Procedural Pain: 0 ?Response to Treatment: Procedure was tolerated well ?Level of Consciousness (Post- Awake and Alert ?procedure): ?Post Debridement Measurements of  Total Wound ?Length: (cm) 2.6 ?Width: (cm) 2.2 ?Depth: (cm) 0.5 ?Volume: (cm?) 2.246 ?Character of Wound/Ulcer Post Debridement: Improved ?Severity of Tissue Post Debridement: Fat layer exposed ?Post Procedure Diagnosis ?Same as Pre-procedure ?Electronic Signature(s) ?Signed: 01/25/2022 4:57:02 PM By: Fredirick Maudlin MD FACS ?Signed: 01/25/2022 5:27:13 PM By: Dellie Catholic RN ?Entered By: Dellie Catholic on 01/25/2022 16:44:29 ?-------------------------------------------------------------------------------- ?HPI Details ?Patient Name: ?Date of Service: ?[name] [name] RE, [name] 01/25/2022 3:15 PM ?Medical Record Number: [id number] ?Patient Account Number: [id number] ?Date of Birth/Sex: ?Treating RN: ?October 13, 1956 (66 y.o. F) ?Primary Care Provider: [name] ?Other Clinician: ?Referring Provider: ?Treating Provider/Extender: Fredirick Maudlin ?[name] ?Weeks in Treatment: 8 ?History of Present Illness ?HPI Description: ADMISSION ?11/12/2018 ?This is a 66 year old woman with type 2 diabetes and diabetic neuropathy. She has been dealing with a left heel plantar wound for roughly 2 years. She states ?this started when she pulled some skin off the area and it progressed into a wound. She also has an area on the tip of her left great toe for 1 year. She has ?been largely followed by Dr. Sharol Given and she had an excision of bone in the left heel in 2017. I do not see microbiology from this excision or pathology. ?Apparently this wound never really closed. She most recently has been using Silvadene cream. Offloading this with a scooter. The last MRI I see was in June ?2018 which did not show osteomyelitis at that time. Apparently this wound is never really progressed towards healing. During her last review by Dr. Sharol Given in ?October it was recommended that she undergo a left BKA and she refused. She went to see a second orthopedic consult at [facility] who ?am ?recommended conservative wound care to see if this will  close or progress towards closure but also warned that possible surgery may be necessary. An x-ray ?that was done at [facility] showed an irregular calcaneal body and calcaneal tuberosity. This is probably po

## 2022-01-26 NOTE — Progress Notes (Signed)
TAQUISHA, PHUNG ([id number]) ?Visit Report for 01/25/2022 ?Arrival Information Details ?Patient Name: Date of Service: ?[name] [name], [name] [place] [name] 01/25/2022 3:15 PM ?Medical Record Number: [id number] ?Patient Account Number: [id number] ?Date of Birth/Sex: Treating RN: ?[birth year] (66 y.o. F) ?Primary Care Naliah Eddington: [name] Other Clinician: ?Referring Jules Vidovich: ?Treating Danella Philson/Extender: Fredirick Maudlin ?[name] ?Weeks in Treatment: 8 ?Visit Information History Since Last Visit ?Added or deleted any medications: No ?Patient Arrived: Wheel Chair ?Any new allergies or adverse reactions: No ?Arrival Time: 15:46 ?Had a fall or experienced change in No ?Accompanied By: husband ?activities of daily living that may affect ?Transfer Assistance: Manual ?risk of falls: ?Patient Identification Verified: Yes ?Signs or symptoms of abuse/neglect since last visito No ?Secondary Verification Process Completed: Yes ?Hospitalized since last visit: No ?Patient Requires Transmission-Based Precautions: No ?Implantable device outside of the clinic excluding No ?Patient Has Alerts: No ?cellular tissue based products placed in the center ?since last visit: ?Has Dressing in Place as Prescribed: Yes ?Pain Present Now: Yes ?Electronic Signature(s) ?Signed: 01/26/2022 9:20:58 AM By: [name] ?Entered By: [name] on 01/25/2022 15:47:19 ?-------------------------------------------------------------------------------- ?Encounter Discharge Information Details ?Patient Name: Date of Service: ?[name] [name], [name] [place] [name] 01/25/2022 3:15 PM ?Medical Record Number: [id number] ?Patient Account Number: [id number] ?Date of Birth/Sex: Treating RN: ?1956/09/17 (66 y.o. F) [place], Mechele Claude ?Primary Care Kariem Wolfson: [name] Other Clinician: ?Referring Chabeli Barsamian: ?Treating Jahmir Salo/Extender: Fredirick Maudlin ?[name] ?Weeks in Treatment: 8 ?Encounter Discharge Information Items Post Procedure Vitals ?Discharge Condition:  Stable ?Temperature (F): 98.3 ?Ambulatory Status: Wheelchair ?Pulse (bpm): 67 ?Discharge Destination: Home ?Respiratory Rate (breaths/min): 18 ?Transportation: Private Auto ?Blood Pressure (mmHg): 145/64 ?Accompanied By: spouse ?Schedule Follow-up Appointment: Yes ?Clinical Summary of Care: Patient Declined ?Electronic Signature(s) ?Signed: 01/25/2022 5:27:13 PM By: Dellie Catholic RN ?Entered By: Dellie Catholic on 01/25/2022 17:26:51 ?-------------------------------------------------------------------------------- ?Lower Extremity Assessment Details ?Patient Name: ?Date of Service: ?[name] [name], [name] [name] 01/25/2022 3:15 PM ?Medical Record Number: [id number] ?Patient Account Number: [id number] ?Date of Birth/Sex: ?Treating RN: ?11-Feb-1956 (66 y.o. F) [place], Mechele Claude ?Primary Care Lasheena Frieze: [name] ?Other Clinician: ?Referring Jayelle Page: ?Treating Aarion Metzgar/Extender: Fredirick Maudlin ?[name] ?Weeks in Treatment: 8 ?Edema Assessment ?Assessed: [Left: No] [Right: No] ?Edema: [Left: Yes] [Right: Yes] ?Calf ?Left: Right: ?Point of Measurement: 27 cm From Medial Instep 41 cm 42.5 cm ?Ankle ?Left: Right: ?Point of Measurement: 9 cm From Medial Instep 26.6 cm 28.6 cm ?Electronic Signature(s) ?Signed: 01/25/2022 5:27:13 PM By: Dellie Catholic RN ?Entered By: Dellie Catholic on 01/25/2022 16:26:43 ?-------------------------------------------------------------------------------- ?Multi Wound Chart Details ?Patient Name: ?Date of Service: ?[name] [name], [name] [name] 01/25/2022 3:15 PM ?Medical Record Number: [id number] ?Patient Account Number: [id number] ?Date of Birth/Sex: ?Treating RN: ?September 27, 1956 (66 y.o. F) ?Primary Care Temitope Flammer: [name] ?Other Clinician: ?Referring Fleming Prill: ?Treating Natalyn Szymanowski/Extender: Fredirick Maudlin ?[name] ?Weeks in Treatment: 8 ?Vital Signs ?Height(in): 61 ?Pulse(bpm): 67 ?Weight(lbs): 169 ?Blood Pressure(mmHg): 145/64 ?Body Mass Index(BMI): 31.9 ?Temperature(??F):  98.3 ?Respiratory Rate(breaths/min): 18 ?Photos: ?Left Calcaneus Right, Plantar Foot Right, Lateral Foot ?Wound Location: ?Gradually Appeared Gradually Appeared Gradually Appeared ?Wounding Event: ?Diabetic Wound/Ulcer of the Lower Diabetic Wound/Ulcer of the Lower Diabetic Wound/Ulcer of the Lower ?Primary Etiology: ?Extremity Extremity Extremity ?Cataracts, Anemia, Sleep Apnea, Cataracts, Anemia, Sleep Apnea, Cataracts, Anemia, Sleep Apnea, ?Comorbid History: ?Coronary Artery Disease, Coronary Artery Disease, Coronary Artery Disease, ?Hypertension, Myocardial Infarction, Hypertension, Myocardial Infarction, Hypertension, Myocardial Infarction, ?Type II Diabetes, Osteoarthritis, Type II Diabetes, Osteoarthritis, Type II Diabetes, Osteoarthritis, ?Osteomyelitis, Neuropathy Osteomyelitis, Neuropathy Osteomyelitis, Neuropathy ?10/30/2020 09/29/2021 09/29/2021 ?Date Acquired: ?8 8 8  ?Weeks  of Treatment: ?Open Open Open ?Wound Status: ?No No No ?Wound Recurrence: ?2.6x2.2x0.5 0.3x0.3x0.3 0.5x0.3x0.2 ?Measurements L x W x D (cm) ?4.492 0.071 0.118 ?A (cm?) : ?rea ?2.246 0.021 0.024 ?Volume (cm?) : ?59.40% 96.00% 95.70% ?% Reduction in A rea: ?79.70% 99.00% 99.10% ?% Reduction in Volume: ?Grade 3 Grade 3 Grade 2 ?Classification: ?Medium Medium Medium ?Exudate A mount: ?Serosanguineous Serosanguineous Serosanguineous ?Exudate Type: ?red, brown red, brown red, brown ?Exudate Color: ?Flat and Intact Thickened Flat and Intact ?Wound Margin: ?Small (1-33%) Large (67-100%) Large (67-100%) ?Granulation A mount: ?Pink, Pale Red, Pink Red, Pink ?Granulation Quality: ?Large (67-100%) Small (1-33%) Small (1-33%) ?Necrotic A mount: ?Fat Layer (Subcutaneous Tissue): Yes Fat Layer (Subcutaneous Tissue): Yes Fat Layer (Subcutaneous Tissue): Yes ?Exposed Structures: ?Fascia: No ?Fascia: No ?Fascia: No ?Tendon: No ?Tendon: No ?Tendon: No ?Muscle: No ?Muscle: No ?Muscle: No ?Joint: No ?Joint: No ?Joint: No ?Bone: No ?Bone: No ?Bone: No ?None  Medium (34-66%) Small (1-33%) ?Epithelialization: ?Treatment Notes ?Electronic Signature(s) ?Signed: 01/25/2022 4:33:21 PM By: Fredirick Maudlin MD FACS ?Entered By: Fredirick Maudlin on 01/25/2022 16:33:21 ?-------------------------------------------------------------------------------- ?Multi-Disciplinary Care Plan Details ?Patient Name: ?Date of Service: ?[name] [name], [name] [name] 01/25/2022 3:15 PM ?Medical Record Number: [id number] ?Patient Account Number: [id number] ?Date of Birth/Sex: ?Treating RN: ?September 24, 1956 (66 y.o. F) Scotton, Mechele Claude ?Primary Care Welby Montminy: [name] ?Other Clinician: ?Referring Deadrick Stidd: ?Treating Keyira Mondesir/Extender: Fredirick Maudlin ?[name] ?Weeks in Treatment: 8 ?Multidisciplinary Care Plan reviewed with physician ?Active Inactive ?Nutrition ?Nursing Diagnoses: ?Impaired glucose control: actual or potential ?Potential for alteratiion in Nutrition/Potential for imbalanced nutrition ?Goals: ?Patient/caregiver agrees to and verbalizes understanding of need to use nutritional supplements and/or vitamins as prescribed ?Date Initiated: 11/29/2021 ?Date Inactivated: 01/03/2022 ?Target Resolution Date: 12/30/2021 ?Goal Status: Met ?Patient/caregiver will maintain therapeutic glucose control ?Date Initiated: 11/29/2021 ?Target Resolution Date: 02/03/2022 ?Goal Status: Active ?Interventions: ?Assess HgA1c results as ordered upon admission and as needed ?Assess patient nutrition upon admission and as needed per policy ?Provide education on elevated blood sugars and impact on wound healing ?Provide education on nutrition ?Treatment Activities: ?Education provided on Nutrition : 11/29/2021 ?Notes: ?Wound/Skin Impairment ?Nursing Diagnoses: ?Impaired tissue integrity ?Knowledge deficit related to ulceration/compromised skin integrity ?Goals: ?Patient/caregiver will verbalize understanding of skin care regimen ?Date Initiated: 11/29/2021 ?Target Resolution Date: 02/03/2022 ?Goal Status:  Active ?Interventions: ?Assess patient/caregiver ability to obtain necessary supplies ?Assess patient/caregiver ability to perform ulcer/skin care regimen upon admission and as needed ?Assess ulceration(s) every visit ?P

## 2022-01-27 ENCOUNTER — Encounter: Payer: Self-pay | Admitting: *Deleted

## 2022-01-27 DIAGNOSIS — I252 Old myocardial infarction: Secondary | ICD-10-CM | POA: Diagnosis not present

## 2022-01-27 DIAGNOSIS — E11628 Type 2 diabetes mellitus with other skin complications: Secondary | ICD-10-CM

## 2022-01-27 DIAGNOSIS — N179 Acute kidney failure, unspecified: Secondary | ICD-10-CM | POA: Diagnosis not present

## 2022-01-27 DIAGNOSIS — N189 Chronic kidney disease, unspecified: Secondary | ICD-10-CM | POA: Diagnosis not present

## 2022-01-27 DIAGNOSIS — I10 Essential (primary) hypertension: Secondary | ICD-10-CM | POA: Diagnosis not present

## 2022-01-27 DIAGNOSIS — E785 Hyperlipidemia, unspecified: Secondary | ICD-10-CM

## 2022-01-27 DIAGNOSIS — E1165 Type 2 diabetes mellitus with hyperglycemia: Secondary | ICD-10-CM

## 2022-01-27 DIAGNOSIS — E1142 Type 2 diabetes mellitus with diabetic polyneuropathy: Secondary | ICD-10-CM

## 2022-01-27 DIAGNOSIS — F418 Other specified anxiety disorders: Secondary | ICD-10-CM

## 2022-01-27 DIAGNOSIS — Z794 Long term (current) use of insulin: Secondary | ICD-10-CM

## 2022-01-27 DIAGNOSIS — L089 Local infection of the skin and subcutaneous tissue, unspecified: Secondary | ICD-10-CM

## 2022-02-02 ENCOUNTER — Encounter (HOSPITAL_BASED_OUTPATIENT_CLINIC_OR_DEPARTMENT_OTHER): Payer: PRIVATE HEALTH INSURANCE | Attending: General Surgery | Admitting: General Surgery

## 2022-02-02 DIAGNOSIS — E11621 Type 2 diabetes mellitus with foot ulcer: Secondary | ICD-10-CM | POA: Diagnosis not present

## 2022-02-02 DIAGNOSIS — L97514 Non-pressure chronic ulcer of other part of right foot with necrosis of bone: Secondary | ICD-10-CM | POA: Diagnosis not present

## 2022-02-02 DIAGNOSIS — L97524 Non-pressure chronic ulcer of other part of left foot with necrosis of bone: Secondary | ICD-10-CM | POA: Diagnosis not present

## 2022-02-02 DIAGNOSIS — M86672 Other chronic osteomyelitis, left ankle and foot: Secondary | ICD-10-CM | POA: Insufficient documentation

## 2022-02-02 DIAGNOSIS — M86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, right ankle and foot: Secondary | ICD-10-CM | POA: Diagnosis not present

## 2022-02-03 ENCOUNTER — Other Ambulatory Visit: Payer: Self-pay | Admitting: Family Medicine

## 2022-02-03 DIAGNOSIS — M8610 Other acute osteomyelitis, unspecified site: Secondary | ICD-10-CM

## 2022-02-03 DIAGNOSIS — Z981 Arthrodesis status: Secondary | ICD-10-CM

## 2022-02-03 DIAGNOSIS — G894 Chronic pain syndrome: Secondary | ICD-10-CM

## 2022-02-03 NOTE — Progress Notes (Signed)
DELAILAH, SPIETH ([id number]) ?Visit Report for 02/02/2022 ?Chief Complaint Document Details ?Patient Name: Date of Service: ?[place], [name] 02/02/2022 3:15 PM ?Medical Record Number: [id number] ?Patient Account Number: [id number] ?Date of Birth/Sex: Treating RN: ?11/27/1955 (66 y.o. F) [place]ton, Mechele Claude ?Primary Care Provider: [name] Other Clinician: ?Referring Provider: ?Treating Provider/Extender: Fredirick Maudlin ?[name] ?Weeks in Treatment: 9 ?Information Obtained from: Patient ?Chief Complaint ?Left heel ulcer ?01/10/2021; patient returns to clinic with 2 large wounds on her bilateral heels ?11/29/2021; patient returns to clinic with substantial wounds on the right foot and left heel. ?Electronic Signature(s) ?Signed: 02/02/2022 4:55:58 PM By: Fredirick Maudlin MD FACS ?Entered By: Fredirick Maudlin on 02/02/2022 16:55:58 ?-------------------------------------------------------------------------------- ?Debridement Details ?Patient Name: Date of Service: ?[place], [name] 02/02/2022 3:15 PM ?Medical Record Number: [id number] ?Patient Account Number: [id number] ?Date of Birth/Sex: Treating RN: ?September 28, 1956 (66 y.o. F) [name], [name] ?Primary Care Provider: [name] Other Clinician: ?Referring Provider: ?Treating Provider/Extender: Fredirick Maudlin ?[name] ?Weeks in Treatment: 9 ?Debridement Performed for Assessment: Wound #15 Right,Lateral Foot ?Performed By: Physician Fredirick Maudlin, MD ?Debridement Type: Debridement ?Severity of Tissue Pre Debridement: Fat layer exposed ?Level of Consciousness (Pre-procedure): Awake and Alert ?Pre-procedure Verification/Time Out Yes - 16:00 ?Taken: ?Start Time: 16:02 ?Pain Control: ?Other : benzocaine 20% spray ?T Area Debrided (L x W): ?otal 0.5 (cm) x 0.5 (cm) = 0.25 (cm?) ?Tissue and other material debrided: ?Non-Viable, Callus, Skin: Epidermis ?Level: Skin/Epidermis ?Debridement Description: Selective/Open Wound ?Instrument:  Curette ?Bleeding: Minimum ?Hemostasis Achieved: Pressure ?Procedural Pain: 0 ?Post Procedural Pain: 0 ?Response to Treatment: Procedure was tolerated well ?Level of Consciousness (Post- Awake and Alert ?procedure): ?Post Debridement Measurements of Total Wound ?Length: (cm) 0.1 ?Width: (cm) 0.1 ?Depth: (cm) 0.1 ?Volume: (cm?) 0.001 ?Character of Wound/Ulcer Post Debridement: Improved ?Severity of Tissue Post Debridement: Fat layer exposed ?Post Procedure Diagnosis ?Same as Pre-procedure ?Electronic Signature(s) ?Signed: 02/02/2022 5:21:29 PM By: Fredirick Maudlin MD FACS ?Signed: 02/03/2022 5:30:13 PM By: Baruch Gouty RN, BSN ?Entered By: Baruch Gouty on 02/02/2022 16:38:15 ?-------------------------------------------------------------------------------- ?Debridement Details ?Patient Name: ?Date of Service: ?[place], [name] 02/02/2022 3:15 PM ?Medical Record Number: [id number] ?Patient Account Number: [id number] ?Date of Birth/Sex: ?Treating RN: ?12/11/1955 (66 y.o. F) [name], [name] ?Primary Care Provider: [name] ?Other Clinician: ?Referring Provider: ?Treating Provider/Extender: Fredirick Maudlin ?[name] ?Weeks in Treatment: 9 ?Debridement Performed for Assessment: Wound #12 Right,Plantar Foot ?Performed By: Physician Fredirick Maudlin, MD ?Debridement Type: Debridement ?Severity of Tissue Pre Debridement: Fat layer exposed ?Level of Consciousness (Pre-procedure): Awake and Alert ?Pre-procedure Verification/Time Out Yes - 16:00 ?Taken: ?Start Time: 16:02 ?Pain Control: ?Other : benzocaine 20% spray ?T Area Debrided (L x W): ?otal 0.5 (cm) x 0.5 (cm) = 0.25 (cm?) ?Tissue and other material debrided: ?Viable, Non-Viable, Callus, Subcutaneous, Skin: Epidermis ?Level: Skin/Subcutaneous Tissue ?Debridement Description: Excisional ?Instrument: Curette ?Bleeding: Minimum ?Hemostasis Achieved: Pressure ?Procedural Pain: 0 ?Post Procedural Pain: 0 ?Response to Treatment: Procedure was tolerated  well ?Level of Consciousness (Post- Awake and Alert ?procedure): ?Post Debridement Measurements of Total Wound ?Length: (cm) 0.4 ?Width: (cm) 0.4 ?Depth: (cm) 0.2 ?Volume: (cm?) 0.025 ?Character of Wound/Ulcer Post Debridement: Improved ?Severity of Tissue Post Debridement: Fat layer exposed ?Post Procedure Diagnosis ?Same as Pre-procedure ?Electronic Signature(s) ?Signed: 02/02/2022 5:21:29 PM By: Fredirick Maudlin MD FACS ?Signed: 02/03/2022 5:30:13 PM By: Baruch Gouty RN, BSN ?Entered By: Baruch Gouty on 02/02/2022 16:39:11 ?-------------------------------------------------------------------------------- ?Debridement Details ?Patient Name: ?Date of Service: ?[place], [name] 02/02/2022 3:15 PM ?Medical Record Number: [id number] ?  Patient Account Number: [id number] ?Date of Birth/Sex: ?Treating RN: ?[birth year] (66 y.o. F) [name], [name] ?Primary Care Provider: [name] ?Other Clinician: ?Referring Provider: ?Treating Provider/Extender: Fredirick Maudlin ?[name] ?Weeks in Treatment: 9 ?Debridement Performed for Assessment: Wound #11 Left Calcaneus ?Performed By: Physician Fredirick Maudlin, MD ?Debridement Type: Debridement ?Severity of Tissue Pre Debridement: Fat layer exposed ?Level of Consciousness (Pre-procedure): Awake and Alert ?Pre-procedure Verification/Time Out Yes - 16:00 ?Taken: ?Start Time: 16:02 ?Pain Control: ?Other : benzocaine 20% spray ?T Area Debrided (L x W): ?otal 3.5 (cm) x 3 (cm) = 10.5 (cm?) ?Tissue and other material debrided: ?Non-Viable, Callus, Slough, Subcutaneous, Skin: Epidermis, Slough ?Level: Skin/Subcutaneous Tissue ?Debridement Description: Excisional ?Instrument: Curette ?Bleeding: Minimum ?Hemostasis Achieved: Pressure ?Procedural Pain: 0 ?Post Procedural Pain: 0 ?Response to Treatment: Procedure was tolerated well ?Level of Consciousness (Post- Awake and Alert ?procedure): ?Post Debridement Measurements of Total Wound ?Length: (cm) 2.4 ?Width: (cm)  2.2 ?Depth: (cm) 0.5 ?Volume: (cm?) 2.073 ?Character of Wound/Ulcer Post Debridement: Improved ?Severity of Tissue Post Debridement: Fat layer exposed ?Post Procedure Diagnosis ?Same as Pre-procedure ?Electronic Signature(s) ?Signed: 02/02/2022 5:21:29 PM By: Fredirick Maudlin MD FACS ?Signed: 02/03/2022 5:30:13 PM By: Baruch Gouty RN, BSN ?Entered By: Baruch Gouty on 02/02/2022 16:40:51 ?-------------------------------------------------------------------------------- ?HPI Details ?Patient Name: ?Date of Service: ?[place], [name] 02/02/2022 3:15 PM ?Medical Record Number: [id number] ?Patient Account Number: [id number] ?Date of Birth/Sex: ?Treating RN: ?1956-07-08 (66 y.o. F) Scotton, Mechele Claude ?Primary Care Provider: [name] ?Other Clinician: ?Referring Provider: ?Treating Provider/Extender: Fredirick Maudlin ?[name] ?Weeks in Treatment: 9 ?History of Present Illness ?HPI Description: ADMISSION ?11/12/2018 ?This is a 66 year old woman with type 2 diabetes and diabetic neuropathy. She has been dealing with a left heel plantar wound for roughly 2 years. She states ?this started when she pulled some skin off the area and it progressed into a wound. She also has an area on the tip of her left great toe for 1 year. She has ?been largely followed by Dr. Sharol Given and she had an excision of bone in the left heel in 2017. I do not see microbiology from this excision or pathology. ?Apparently this wound never really closed. She most recently has been using Silvadene cream. Offloading this with a scooter. The last MRI I see was in June ?2018 which did not show osteomyelitis at that time. Apparently this wound is never really progressed towards healing. During her last review by Dr. Sharol Given in ?October it was recommended that she undergo a left BKA and she refused. She went to see a second orthopedic consult at [facility] who ?am ?recommended conservative wound care to see if this will close or progress towards  closure but also warned that possible surgery may be necessary. An x-ray ?that was done at [facility] showed an irregular calcaneal body and calcaneal tuberosity. This is probably postprocedural. Previous x-rays at [facility] had s

## 2022-02-03 NOTE — Progress Notes (Signed)
[name] [name], [name] [name] ([id number]) ?Visit Report for 02/02/2022 ?Arrival Information Details ?Patient Name: Date of Service: ?[name] [name], [name] Dakota [name] 02/02/2022 3:15 PM ?Medical Record Number: [id number] ?Patient Account Number: [id number] ?Date of Birth/Sex: Treating RN: ?26-Aug-1956 (66 y.o. F) [place]ton, Mechele Claude ?Primary Care [place]: [name] Other Clinician: ?Referring Eastin Swing: ?Treating Mariene Dickerman/Extender: Fredirick Maudlin ?[name] ?Weeks in Treatment: 9 ?Visit Information History Since Last Visit ?Added or deleted any medications: No ?Patient Arrived: Other ?Any [name] allergies or adverse reactions: No ?Arrival Time: 15:56 ?Had a fall or experienced change in No ?Accompanied By: husband ?activities of daily living that may affect ?Transfer Assistance: None ?risk of falls: ?Patient Identification Verified: Yes ?Signs or symptoms of abuse/neglect since last visito No ?Secondary Verification Process Completed: Yes ?Hospitalized since last visit: No ?Patient Requires Transmission-Based Precautions: No ?Implantable device outside of the clinic excluding No ?Patient Has Alerts: No ?cellular tissue based products placed in the center ?since last visit: ?Has Dressing in Place as Prescribed: Yes ?Pain Present Now: Yes ?Electronic Signature(s) ?Signed: 02/02/2022 4:07:00 PM By: [name] ?Entered By: [name] on 02/02/2022 15:57:18 ?-------------------------------------------------------------------------------- ?Encounter Discharge Information Details ?Patient Name: Date of Service: ?[name] [name], [name] Dakota [name] 02/02/2022 3:15 PM ?Medical Record Number: [id number] ?Patient Account Number: [id number] ?Date of Birth/Sex: Treating RN: ?1955/12/21 (66 y.o. F) [name], [name] ?Primary Care Harla Mensch: [name] Other Clinician: ?Referring Elayna Tobler: ?Treating Artemio Dobie/Extender: Fredirick Maudlin ?[name] ?Weeks in Treatment: 9 ?Encounter Discharge Information Items Post Procedure Vitals ?Discharge  Condition: Stable ?Temperature (F): 98 ?Ambulatory Status: Wheelchair ?Pulse (bpm): 70 ?Discharge Destination: Home ?Respiratory Rate (breaths/min): 18 ?Transportation: Private Auto ?Blood Pressure (mmHg): 145/69 ?Accompanied By: spouse ?Schedule Follow-up Appointment: Yes ?Clinical Summary of Care: Patient Declined ?Electronic Signature(s) ?Signed: 02/03/2022 5:30:13 PM By: Baruch Gouty RN, BSN ?Entered By: Baruch Gouty on 02/02/2022 17:01:43 ?-------------------------------------------------------------------------------- ?Lower Extremity Assessment Details ?Patient Name: ?Date of Service: ?Benedict, [name] [name] 02/02/2022 3:15 PM ?Medical Record Number: [id number] ?Patient Account Number: [id number] ?Date of Birth/Sex: ?Treating RN: ?1956-07-20 (66 y.o. F) [name], [name] ?Primary Care Wyllow Seigler: [name] ?Other Clinician: ?Referring Berle Fitz: ?Treating Emili Mcloughlin/Extender: Fredirick Maudlin ?[name] ?Weeks in Treatment: 9 ?Edema Assessment ?Assessed: [Left: No] [Right: No] ?Edema: [Left: Yes] [Right: Yes] ?Calf ?Left: Right: ?Point of Measurement: 27 cm From Medial Instep 37.5 cm 37.5 cm ?Ankle ?Left: Right: ?Point of Measurement: 9 cm From Medial Instep 23 cm 26.3 cm ?Vascular Assessment ?Pulses: ?Dorsalis Pedis ?Palpable: [Left:Yes Yes] [Right:Yes] ?Electronic Signature(s) ?Signed: 02/03/2022 5:30:13 PM By: Baruch Gouty RN, BSN ?Entered By: Baruch Gouty on 02/02/2022 16:27:21 ?-------------------------------------------------------------------------------- ?Multi Wound Chart Details ?Patient Name: ?Date of Service: ?[name] [name], [name] [name] 02/02/2022 3:15 PM ?Medical Record Number: [id number] ?Patient Account Number: [id number] ?Date of Birth/Sex: ?Treating RN: ?01-05-1956 (66 y.o. F) Scotton, Mechele Claude ?Primary Care Koal Eslinger: [name] ?Other Clinician: ?Referring Ancel Easler: ?Treating Hadlie Gipson/Extender: Fredirick Maudlin ?[name] ?Weeks in Treatment: 9 ?Vital Signs ?Height(in):  61 ?Pulse(bpm): 70 ?Weight(lbs): 169 ?Blood Pressure(mmHg): 145/69 ?Body Mass Index(BMI): 31.9 ?Temperature(??F): 98.0 ?Respiratory Rate(breaths/min): 18 ?Photos: [11:Left Calcaneus] [12:Right, Plantar Foot] [15:Right, Lateral Foot] ?Wound Location: [11:Gradually Appeared] [12:Gradually Appeared] [15:Gradually Appeared] ?Wounding Event: [11:Diabetic Wound/Ulcer of the Lower] [12:Diabetic Wound/Ulcer of the Lower] [15:Diabetic Wound/Ulcer of the Lower] ?Primary Etiology: [11:Extremity Cataracts, Anemia, Sleep Apnea,] [12:Extremity Cataracts, Anemia, Sleep Apnea,] [15:Extremity Cataracts, Anemia, Sleep Apnea,] ?Comorbid History: [11:Coronary Artery Disease, Hypertension, Myocardial Infarction,Hypertension, Myocardial Infarction, Hypertension, Myocardial Infarction, Type II Diabetes, Osteoarthritis, Osteomyelitis, Neuropathy 10/30/2020] [12:Coronary Artery  ?Disease, Type II Diabetes, Osteoarthritis, Osteomyelitis, Neuropathy 09/29/2021] [15:Coronary Artery  Disease, Type II Diabetes, Osteoarthritis, Osteomyelitis, Neuropathy 09/29/2021] ?Date Acquired: [11:9] [12:9] [15:9] ?Weeks of Treatment: [11:Open] [12:Open] [15:Open] ?Wound Status: [11:No] [12:No] [15:No] ?Wound Recurrence: [11:2.4x2.2x0.5] [12:0.2x0.2x0.2] [15:0.1x0.1x0.1] ?Measurements L x W x D (cm) [11:4.147] [12:0.031] [15:0.008] ?A (cm?) : ?rea [11:2.073] [12:0.006] [15:0.001] ?Volume (cm?) : [11:62.50%] [12:98.20%] [15:99.70%] ?% Reduction in A [11:rea: 81.30%] [12:99.70%] [15:100.00%] ?% Reduction in Volume: [12:12] ?Starting Position 1 (o'clock): [12:12] ?Ending Position 1 (o'clock): [12:0.3] ?Maximum Distance 1 (cm): [11:No] [12:Yes] [15:No] ?Undermining: [11:Grade 3] [12:Grade 3] [15:Grade 2] ?Classification: [11:Medium] [12:Small] [15:None Present] ?Exudate A mount: [11:Serosanguineous] [12:Serosanguineous] [15:N/A] ?Exudate Type: [11:red, brown] [12:red, brown] [15:N/A] ?Exudate Color: [11:Thickened] [12:Thickened] [15:Flat and Intact] ?Wound Margin:  [11:Small (1-33%)] [12:Small (1-33%)] [15:None Present (0%)] ?Granulation A mount: [11:Pink, Pale] [12:Red] [15:N/A] ?Granulation Quality: [11:Large (67-100%)] [12:None Present (0%)] [15:None Present (0%)] ?Necrotic A mount: ?[11:Fat Layer (Subcutaneous Tissue): Yes Fat Layer (Subcutaneous Tissue): Yes Fascia: No] ?Exposed Structures: ?[11:Fascia: No Tendon: No Muscle: No Joint: No Bone: No None] [12:Fascia: No Tendon: No Muscle: No Joint: No Bone: No Small (1-33%)] [15:Fat Layer (Subcutaneous Tissue): No Tendon: No Muscle: No Joint: No Bone: No Large (67-100%)] ?Epithelialization: [11:Debridement - Excisional] [12:Debridement - Excisional] [15:Debridement - Selective/Open Wound] ?Debridement: ?Pre-procedure Verification/Time Out 16:00 [12:16:00] [15:16:00] ?Taken: [11:Other] [12:Other] [15:Other] ?Pain Control: [11:Callus, Subcutaneous, Slough] [12:Callus, Subcutaneous] [15:Callus] ?Tissue Debrided: [11:Skin/Subcutaneous Tissue] [12:Skin/Subcutaneous Tissue] [15:Skin/Epidermis] ?Level: [11:10.5] [12:0.25] [15:0.25] ?Debridement A (sq cm): [11:rea Curette] [12:Curette] [15:Curette] ?Instrument: [11:Minimum] [12:Minimum] [15:Minimum] ?Bleeding: [11:Pressure] [12:Pressure] [15:Pressure] ?Hemostasis A chieved: [11:0] [12:0] [15:0] ?Procedural Pain: [11:0] [12:0] [15:0] ?Post Procedural Pain: [11:Procedure was tolerated well] [12:Procedure was tolerated well] [15:Procedure was tolerated well] ?Debridement Treatment Response: [11:2.4x2.2x0.5] [12:0.4x0.4x0.2] [15:0.1x0.1x0.1] ?Post Debridement Measurements L x ?W x D (cm) [11:2.073] [12:0.025] [15:0.001] ?Post Debridement Volume: (cm?) [11:N/A] [12:N/A] [15:callous] ?Assessment Notes: [11:Debridement] [12:Debridement] [15:Debridement] ?Treatment Notes ?Electronic Signature(s) ?Signed: 02/02/2022 4:55:37 PM By: Fredirick Maudlin MD FACS ?Signed: 02/03/2022 7:38:58 AM By: Dellie Catholic RN ?Entered By: Fredirick Maudlin on 02/02/2022  16:55:36 ?-------------------------------------------------------------------------------- ?Multi-Disciplinary Care Plan Details ?Patient Name: ?Date of Service: ?[place], [name] [name] 02/02/2022 3:15 PM ?Medical Record Number: [id number] ?Patient Account [place]

## 2022-02-05 ENCOUNTER — Encounter: Admit: 2022-02-05 | Discharge: 2022-02-05 | Payer: MEDICARE

## 2022-02-05 MED ORDER — FLUTICASONE PROPIONATE 50 MCG/ACTUATION NA SPSN
3 refills
Start: 2022-02-05 — End: ?

## 2022-02-06 ENCOUNTER — Other Ambulatory Visit: Payer: Self-pay | Admitting: Family Medicine

## 2022-02-06 DIAGNOSIS — G894 Chronic pain syndrome: Secondary | ICD-10-CM

## 2022-02-06 DIAGNOSIS — Z981 Arthrodesis status: Secondary | ICD-10-CM

## 2022-02-06 DIAGNOSIS — M8610 Other acute osteomyelitis, unspecified site: Secondary | ICD-10-CM

## 2022-02-06 MED ORDER — HYDROCODONE-ACETAMINOPHEN 5-325 MG PO TABS: 1.0000 | ORAL_TABLET | Freq: Four times a day (QID) | ORAL | 0 refills | Status: DC | PRN

## 2022-02-06 NOTE — Telephone Encounter (Signed)
Requesting: [employer] ?Contract: 06/24/2020 ?UDS: 01/07/22 ?Last Visit: 12/19/21 ?Next Visit: none ?Last Refill: 01/05/2022 ? ?Please Advise ? ?

## 2022-02-07 ENCOUNTER — Ambulatory Visit (INDEPENDENT_AMBULATORY_CARE_PROVIDER_SITE_OTHER): Payer: PRIVATE HEALTH INSURANCE | Admitting: *Deleted

## 2022-02-07 DIAGNOSIS — E042 Nontoxic multinodular goiter: Secondary | ICD-10-CM

## 2022-02-07 DIAGNOSIS — E785 Hyperlipidemia, unspecified: Secondary | ICD-10-CM

## 2022-02-07 DIAGNOSIS — I2510 Atherosclerotic heart disease of native coronary artery without angina pectoris: Secondary | ICD-10-CM

## 2022-02-07 DIAGNOSIS — N1832 Chronic kidney disease, stage 3b: Secondary | ICD-10-CM

## 2022-02-07 DIAGNOSIS — N179 Acute kidney failure, unspecified: Secondary | ICD-10-CM

## 2022-02-07 DIAGNOSIS — E1165 Type 2 diabetes mellitus with hyperglycemia: Secondary | ICD-10-CM

## 2022-02-07 DIAGNOSIS — F418 Other specified anxiety disorders: Secondary | ICD-10-CM

## 2022-02-07 DIAGNOSIS — I10 Essential (primary) hypertension: Secondary | ICD-10-CM

## 2022-02-07 DIAGNOSIS — I252 Old myocardial infarction: Secondary | ICD-10-CM

## 2022-02-07 NOTE — Chronic Care Management (AMB) (Signed)
?Chronic Care Management  ? ? Clinical Social Work Note ? ?02/07/2022 ?Name: [name] [name] MRN: [id number] DOB: 1956/07/16 ? ?[name] [name] is a 66 y.o. year old adult who is a primary care patient of Ann Held, DO. The CCM team was consulted to assist the patient with chronic disease management and/or care coordination needs related to: [employer] and [place] and Resources.  ? ?Engaged with patient by telephone for follow up visit in response to provider referral for social work chronic care management and care coordination services.  ? ?Consent to Services:  ?The patient was given information about Chronic Care Management services, agreed to services, and gave verbal consent prior to initiation of services.  Please see initial visit note for detailed documentation.  ? ?Patient agreed to services and consent obtained.  ? ?Assessment: Review of patient past medical history, allergies, medications, and health status, including review of relevant consultants reports was performed today as part of a comprehensive evaluation and provision of chronic care management and care coordination services.    ? ?SDOH (Social Determinants of Health) assessments and interventions performed:   ? ?Advanced Directives Status: Not addressed in this encounter. ? ?CCM Care Plan ? ?Allergies  ?Allergen Reactions  ? Bee Pollen Anaphylaxis  ? Fish-Derived Products Hives, Shortness Of Breath, Swelling and Rash  ?  Hives get in throat causing trouble breathing  ? Mushroom Extract Complex Anaphylaxis  ? Penicillins Anaphylaxis  ?  **Tolerated cefepime March 2021 ?Did it involve swelling of the face/tongue/throat, SOB, or low BP? Yes ?Did it involve sudden or severe rash/hives, skin peeling, or any reaction on the inside of your mouth or nose? No ?Did you need to seek medical attention at a hospital or doctor's office? Yes ?When did it last happen?  A few months ago ?If all above answers are "NO", may  proceed with cephalosporin use.  ? Rosemary Oil Anaphylaxis  ? Shellfish Allergy Hives, Shortness Of Breath, Swelling and Rash  ? Tomato Hives and Shortness Of Breath  ?  Hives in throat causes her trouble breathing  ? Acetaminophen Other (See Comments)  ?  GI upset  ? Aloe Vera Hives  ? Broccoli [Brassica Oleracea] Hives  ? Acyclovir And Related Other (See Comments)  ?  Unknown reaction  ? Naproxen Other (See Comments)  ?  Unknown reaction  ? ? ?Outpatient Encounter Medications as of 02/07/2022  ?Medication Sig Note  ? acetaminophen (TYLENOL) 325 MG tablet Take 2 tablets (650 mg total) by mouth every 6 (six) hours as needed for mild pain, fever or headache (or Fever >/= 101). Discontinue 500/1000 mg dosing   ? apixaban (ELIQUIS) 5 MG TABS tablet Take 1 tablet (5 mg total) by mouth 2 (two) times daily.   ? atorvastatin (LIPITOR) 10 MG tablet Take 1 tablet (10 mg total) by mouth daily.   ? carvedilol (COREG) 6.25 MG tablet Take 1 tablet (6.25 mg total) by mouth 2 (two) times daily with a meal.   ? Continuous Blood Gluc Receiver (FREESTYLE LIBRE 14 DAY READER) DEVI 1 Device by Does not apply route daily.   ? Continuous Blood Gluc Sensor (FREESTYLE LIBRE 14 DAY SENSOR) MISC 1 Device by Does not apply route daily.   ? diazepam (VALIUM) 5 MG tablet Take 1 tablet (5 mg total) by mouth every 12 (twelve) hours as needed for anxiety.   ? DULoxetine (CYMBALTA) 60 MG capsule Take 1 capsule (60 mg total) by mouth daily.   ?  ferrous sulfate 325 (65 FE) MG tablet Take 1 tablet (325 mg total) by mouth 2 (two) times daily with a meal.   ? folic acid (FOLVITE) 1 MG tablet Take 2 tablets by mouth once daily (Patient taking differently: Take 2 mg by mouth daily.) 01/08/2022: Pt's husband reported that the pt has been out of this medication for roughly a week, and is in need of more refills.  ? furosemide (LASIX) 20 MG tablet Take 2 tablets (40 mg total) by mouth daily.   ? HYDROcodone-acetaminophen (NORCO/VICODIN) 5-325 MG tablet Take 1  tablet by mouth every 6 (six) hours as needed for moderate pain.   ? insulin glargine (LANTUS SOLOSTAR) 100 UNIT/ML Solostar Pen INJECT 20 UNITS SUBCUTANEOUSLY IN THE MORNING AND 40 AT NIGHT (Patient taking differently: 15-18 Units at bedtime.)   ? montelukast (SINGULAIR) 10 MG tablet TAKE 1 TABLET BY MOUTH AT BEDTIME (Patient taking differently: Take 10 mg by mouth at bedtime.) 01/08/2022: Spouse reports that the pt has been out of this medication and needs more refills. PCP will not grant these refills and has not expressed as to why. Per spouse, pt needs this medication still.  ? Multiple Vitamin (MULTIVITAMIN WITH MINERALS) TABS tablet Take 1 tablet by mouth daily.   ? NONFORMULARY OR COMPOUNDED ITEM Power wheelchair  #1  as directed   ? ondansetron (ZOFRAN) 8 MG tablet Take 0.5 tablets (4 mg total) by mouth every 8 (eight) hours as needed for nausea or vomiting. (Patient taking differently: Take 4-8 mg by mouth every 8 (eight) hours as needed for nausea or vomiting.) 01/08/2022: Spouse reported that the pt is all out of this medication and in need of refills.  ? pregabalin (LYRICA) 75 MG capsule Take 1 capsule (75 mg total) by mouth 2 (two) times daily.   ? VITAMIN D PO Take 125 mcg by mouth daily.   ? [DISCONTINUED] metFORMIN (GLUCOPHAGE) 1000 MG tablet Take 1,000 mg by mouth 2 (two) times daily with a meal.     ? [DISCONTINUED] omeprazole (PRILOSEC) 20 MG capsule Take 20 mg by mouth daily.     ? ?No facility-administered encounter medications on file as of 02/07/2022.  ? ? ?Patient Active Problem List  ? Diagnosis Date Noted  ? Acute CHF (congestive heart failure) ([place]) 01/07/2022  ? Acute on chronic diastolic CHF (congestive heart failure) ([place]) 01/07/2022  ? Prolonged QT interval 01/07/2022  ? Cellulitis 01/07/2022  ? Streptococcal bacteremia   ? Severe protein-calorie malnutrition ([place])   ? Severe sepsis ([place])   ? Osteomyelitis of left foot ([place]) 10/16/2021  ? Cellulitis of right foot 10/16/2021  ?  Hyponatremia 10/16/2021  ? Hyperkalemia 10/16/2021  ? Acute kidney injury superimposed on CKD ([place]) 10/16/2021  ? Leukocytosis 10/16/2021  ? Diabetic foot ulcers ([place]) 10/16/2021  ? DM (diabetes mellitus), type 2 with renal complications ([place]) 32/35/5732  ? Generalized weakness 10/16/2021  ? Portal hypertension ([place]) 08/16/2021  ? 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[place]) 08/16/2021  ? Eczema of scalp 06/27/2021  ? Hypothyroidism 06/27/2021  ? Pruritus 06/27/2021  ? Seasonal allergic rhinitis due to pollen 02/02/2021  ? CRF (chronic renal failure), stage 4 (severe) ([place]) 02/02/2021  ? PTSD (post-traumatic stress disorder)   ? Osteoarthritis   ? MRSA (methicillin resistant Staphylococcus aureus)   ? Hypertension   ? High cholesterol   ? Hepatic steatosis   ? GERD (gastroesophageal reflux disease)   ? Fibromyalgia   ? Fatty liver   ? Exertional shortness of  breath   ? Episode of visual loss of left eye   ? Depression   ? Dehiscence of closure of skin   ? Constipation   ? Chronic kidney disease   ? Cataract   ? Bulging lumbar disc   ? Asthma   ? Anxiety   ? Anginal pain ([place])   ? Anemia   ? Tinea capitis 10/11/2020  ? Tinea corporis 10/11/2020  ? SOB (shortness of breath) 10/11/2020  ? Hip pain 05/16/2020  ? IDA (iron deficiency anemia) 05/04/2020  ? 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 S/P lumbar fusion 03/04/2020  ? Spinal stenosis of lumbar region 01/22/2020  ? Osteomyelitis of vertebra of thoracolumbar region [facility]) 01/21/2020  ? Compression fracture of L1 lumbar vertebra ([place]) 11/17/2019  ? Fracture of multiple ribs 11/17/2019  ? Diarrhea 09/04/2019  ? Pressure injury of skin 04/17/2019  ? Septic arthritis of elbow, right ([place]) 04/17/2019  ? Retinopathy 04/17/2019  ? 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 Sleep apnea 11/16/2017  ? Diabetic foot infection ([place]) 04/06/2017  ? Type 2 diabetes mellitus with hyperglycemia, with long-term current use of  insulin ([place]) 04/06/2017  ? Neuropathy 05/05/2015  ? Renal insufficiency 05/05/2015  ? Diabetic peripheral neuropathy ([place]) 01/12/2015  ? CKD (chronic kidney disease), stage III ([place]) 05/27/2014  ? History of MI (

## 2022-02-07 NOTE — Patient Instructions (Signed)
Visit Information ? ?Thank you for taking time to visit with me today. Please don't hesitate to contact me if I can be of assistance to you before our next scheduled telephone appointment. ? ?Following are the goals we discussed today:  ?Patient Goals/Self-Care Activities: ?Continue to receive personal counseling with LCSW, on a bi-weekly basis, to reduce and manage symptoms of Anxiety, Depression, and Post-Traumatic Stress Disorder, until well-managed.   ?Attend evaluation with physical therapist at [facility] on 02/25/2022 at 12:30 pm to obtain a motorized wheelchair. ?Contact LCSW directly (# [phone]), if you have questions, need assistance, or if additional social work needs are identified.   ?Follow-Up Date:  02/21/2022 at 9:45 am ? ?Please call the care guide team at [phone] if you need to cancel or reschedule your appointment.  ? ?If you are experiencing a Mental Health or [place] or need someone to talk to, please call the Suicide and Crisis Lifeline: 988 ?call the [place] National Suicide Prevention Lifeline: (979) 798-8717 or TTY: 385-534-4474 TTY (970) 253-0811) to talk to a trained counselor ?call 1-800-273-TALK (toll free, 24 hour hotline) ?go to [facility] [address], [place] [phone]) ?call the [facility]: [phone] ?call 911  ? ?Patient verbalizes understanding of instructions and care plan provided today and agrees to view in [place]. Active MyChart status confirmed with patient.   ? ?Nat Christen LCSW ?Licensed Clinical Social Worker ?[place] ?[phone]  ?

## 2022-02-08 ENCOUNTER — Encounter: Payer: Self-pay | Admitting: Family

## 2022-02-08 ENCOUNTER — Inpatient Hospital Stay (HOSPITAL_BASED_OUTPATIENT_CLINIC_OR_DEPARTMENT_OTHER): Payer: PRIVATE HEALTH INSURANCE | Admitting: Family

## 2022-02-08 ENCOUNTER — Inpatient Hospital Stay: Payer: PRIVATE HEALTH INSURANCE

## 2022-02-08 ENCOUNTER — Inpatient Hospital Stay: Payer: PRIVATE HEALTH INSURANCE | Attending: Hematology & Oncology

## 2022-02-08 VITALS — BP 112/55 | HR 64 | Temp 98.2°F | Resp 18

## 2022-02-08 DIAGNOSIS — D649 Anemia, unspecified: Secondary | ICD-10-CM

## 2022-02-08 DIAGNOSIS — D500 Iron deficiency anemia secondary to blood loss (chronic): Secondary | ICD-10-CM

## 2022-02-08 DIAGNOSIS — D631 Anemia in chronic kidney disease: Secondary | ICD-10-CM

## 2022-02-08 DIAGNOSIS — N183 Chronic kidney disease, stage 3 unspecified: Secondary | ICD-10-CM | POA: Diagnosis present

## 2022-02-08 DIAGNOSIS — D508 Other iron deficiency anemias: Secondary | ICD-10-CM

## 2022-02-08 LAB — CBC WITH DIFFERENTIAL (CANCER CENTER ONLY)
Abs Immature Granulocytes: 0.06 10*3/uL (ref 0.00–0.07)
Basophils Absolute: 0.1 10*3/uL (ref 0.0–0.1)
Basophils Relative: 1 %
Eosinophils Absolute: 1.2 10*3/uL — ABNORMAL HIGH (ref 0.0–0.5)
Eosinophils Relative: 19 %
HCT: 30.3 % — ABNORMAL LOW (ref 36.0–46.0)
Hemoglobin: 9.1 g/dL — ABNORMAL LOW (ref 12.0–15.0)
Immature Granulocytes: 1 %
Lymphocytes Relative: 17 %
Lymphs Abs: 1 10*3/uL (ref 0.7–4.0)
MCH: 28.1 pg (ref 26.0–34.0)
MCHC: 30 g/dL (ref 30.0–36.0)
MCV: 93.5 fL (ref 80.0–100.0)
Monocytes Absolute: 0.4 10*3/uL (ref 0.1–1.0)
Monocytes Relative: 7 %
Neutro Abs: 3.3 10*3/uL (ref 1.7–7.7)
Neutrophils Relative %: 55 %
Platelet Count: 152 10*3/uL (ref 150–400)
RBC: 3.24 MIL/uL — ABNORMAL LOW (ref 3.87–5.11)
RDW: 15.4 % (ref 11.5–15.5)
WBC Count: 6.1 10*3/uL (ref 4.0–10.5)
nRBC: 0 % (ref 0.0–0.2)

## 2022-02-08 LAB — CMP (CANCER CENTER ONLY)
ALT: 7 U/L (ref 0–44)
AST: 14 U/L — ABNORMAL LOW (ref 15–41)
Albumin: 3.6 g/dL (ref 3.5–5.0)
Alkaline Phosphatase: 60 U/L (ref 38–126)
Anion gap: 8 (ref 5–15)
BUN: 60 mg/dL — ABNORMAL HIGH (ref 8–23)
CO2: 21 mmol/L — ABNORMAL LOW (ref 22–32)
Calcium: 9.4 mg/dL (ref 8.9–10.3)
Chloride: 110 mmol/L (ref 98–111)
Creatinine: 3.16 mg/dL (ref 0.44–1.00)
GFR, Estimated: 16 mL/min — ABNORMAL LOW (ref >60)
Glucose, Bld: 78 mg/dL (ref 70–99)
Potassium: 4.9 mmol/L (ref 3.5–5.1)
Sodium: 139 mmol/L (ref 135–145)
Total Bilirubin: 0.6 mg/dL (ref 0.3–1.2)
Total Protein: 7.8 g/dL (ref 6.5–8.1)

## 2022-02-08 LAB — SAMPLE TO BLOOD BANK

## 2022-02-08 LAB — RETICULOCYTES
Immature Retic Fract: 9.6 % (ref 2.3–15.9)
RBC.: 3.24 MIL/uL — ABNORMAL LOW (ref 3.87–5.11)
Retic Count, Absolute: 50.9 10*3/uL (ref 19.0–186.0)
Retic Ct Pct: 1.6 % (ref 0.4–3.1)

## 2022-02-08 MED ORDER — EPOETIN ALFA-EPBX 40000 UNIT/ML IJ SOLN
40000.0000 [IU] | Freq: Once | INTRAMUSCULAR | Status: AC
  Administered 2022-02-08: 40000 [IU] via SUBCUTANEOUS
  Filled 2022-02-08: qty 1

## 2022-02-08 NOTE — Patient Instructions (Signed)
Epoetin Alfa injection °What is this medication? °EPOETIN ALFA (e POE e tin AL fa) helps your body make more red blood cells. This medicine is used to treat anemia caused by chronic kidney disease, cancer chemotherapy, or HIV-therapy. It may also be used before surgery if you have anemia. °This medicine may be used for other purposes; ask your health care provider or pharmacist if you have questions. °COMMON BRAND NAME(S): Epogen, Procrit, Retacrit °What should I tell my care team before I take this medication? °They need to know if you have any of these conditions: °cancer °heart disease °high blood pressure °history of blood clots °history of stroke °low levels of folate, iron, or vitamin B12 in the blood °seizures °an unusual or allergic reaction to erythropoietin, albumin, benzyl alcohol, hamster proteins, other medicines, foods, dyes, or preservatives °pregnant or trying to get pregnant °breast-feeding °How should I use this medication? °This medicine is for injection into a vein or under the skin. It is usually given by a health care professional in a hospital or clinic setting. °If you get this medicine at home, you will be taught how to prepare and give this medicine. Use exactly as directed. Take your medicine at regular intervals. Do not take your medicine more often than directed. °It is important that you put your used needles and syringes in a special sharps container. Do not put them in a trash can. If you do not have a sharps container, call your pharmacist or healthcare provider to get one. °A special MedGuide will be given to you by the pharmacist with each prescription and refill. Be sure to read this information carefully each time. °Talk to your pediatrician regarding the use of this medicine in children. While this drug may be prescribed for selected conditions, precautions do apply. °Overdosage: If you think you have taken too much of this medicine contact a poison control center or emergency  room at once. °NOTE: This medicine is only for you. Do not share this medicine with others. °What if I miss a dose? °If you miss a dose, take it as soon as you can. If it is almost time for your next dose, take only that dose. Do not take double or extra doses. °What may interact with this medication? °Interactions have not been studied. °This list may not describe all possible interactions. Give your health care provider a list of all the medicines, herbs, non-prescription drugs, or dietary supplements you use. Also tell them if you smoke, drink alcohol, or use illegal drugs. Some items may interact with your medicine. °What should I watch for while using this medication? °Your condition will be monitored carefully while you are receiving this medicine. °You may need blood work done while you are taking this medicine. °This medicine may cause a decrease in vitamin B6. You should make sure that you get enough vitamin B6 while you are taking this medicine. Discuss the foods you eat and the vitamins you take with your health care professional. °What side effects may I notice from receiving this medication? °Side effects that you should report to your doctor or health care professional as soon as possible: °allergic reactions like skin rash, itching or hives, swelling of the face, lips, or tongue °seizures °signs and symptoms of a blood clot such as breathing problems; changes in vision; chest pain; severe, sudden headache; pain, swelling, warmth in the leg; trouble speaking; sudden numbness or weakness of the face, arm or leg °signs and symptoms of a stroke like   changes in vision; confusion; trouble speaking or understanding; severe headaches; sudden numbness or weakness of the face, arm or leg; trouble walking; dizziness; loss of balance or coordination °Side effects that usually do not require medical attention (report to your doctor or health care professional if they continue or are  bothersome): °chills °cough °dizziness °fever °headaches °joint pain °muscle cramps °muscle pain °nausea, vomiting °pain, redness, or irritation at site where injected °This list may not describe all possible side effects. Call your doctor for medical advice about side effects. You may report side effects to FDA at 1-800-FDA-1088. °Where should I keep my medication? °Keep out of the reach of children. °Store in a refrigerator between 2 and 8 degrees C (36 and 46 degrees F). Do not freeze or shake. Throw away any unused portion if using a single-dose vial. Multi-dose vials can be kept in the refrigerator for up to 21 days after the initial dose. Throw away unused medicine. °NOTE: This sheet is a summary. It may not cover all possible information. If you have questions about this medicine, talk to your doctor, pharmacist, or health care provider. °© 2022 Elsevier/Gold Standard (2017-06-19 00:00:00) ° °

## 2022-02-08 NOTE — Progress Notes (Signed)
?Hematology and Oncology Follow Up Visit ? ?[name] [name] ?[id number] ?1956-09-14 66 y.o. ?02/08/2022 ? ? 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  ?Current Therapy: ?Eliquis 5 mg PO BID ?Folic acid 2 mg PO daily ?IV iron as indicated  ?Retacrit 40,000 units for Hgb < 11 ?  ?Interim History:  [name] [name] is here today for follow-up. Hgb is improved at 9.1. She notes that her energy is improved somewhat.  ?She rests as needed.  ?She has been having nose bleeds recently. She plans to call her PCP and see what she needs to do with her Eliquis.  ?No other blood loss noted. No abnormal bruising or petechiae.  ?She states that wound care states that her right foot has almost completely healed and her left foot is much improved. She has dressings on both feet that are clean, dry and intact.  ?No falls or syncope to report. She gets around in her motorized scooter.  ?She has been eating well and staying hydrated throughout the day. Her weight is 169 lbs.  ? ?ECOG Performance Status: 2 - Symptomatic, <50% confined to bed ? ?Medications:  ?Allergies as of 02/08/2022   ? ?   Reactions  ? Bee Pollen Anaphylaxis  ? Fish-derived Products Hives, Shortness Of Breath, Swelling, Rash  ? Hives get in throat causing trouble breathing  ? Mushroom Extract Complex Anaphylaxis  ? Penicillins Anaphylaxis  ? **Tolerated cefepime March 2021 ?Did it involve swelling of the face/tongue/throat, SOB, or low BP? Yes ?Did it involve sudden or severe rash/hives, skin peeling, or any reaction on the inside of your mouth or nose? No ?Did you need to seek medical attention at a hospital or doctor's office? Yes ?When did it last happen?  A few months ago ?If all above answers are "NO", may proceed with cephalosporin use.  ? Rosemary Oil Anaphylaxis  ? Shellfish Allergy Hives, Shortness Of  Breath, Swelling, Rash  ? Tomato Hives, Shortness Of Breath  ? Hives in throat causes her trouble breathing  ? Acetaminophen Other (See Comments)  ? GI upset  ? Aloe Vera Hives  ? Broccoli [brassica Oleracea] Hives  ? Acyclovir And Related Other (See Comments)  ? Unknown reaction  ? Naproxen Other (See Comments)  ? Unknown reaction  ? ?  ? ?  ?Medication List  ?  ? ?  ? Accurate as of February 08, 2022  1:11 PM. If you have any questions, ask your nurse or doctor.  ?  ?  ? ?  ? ?acetaminophen 325 MG tablet ?Commonly known as: TYLENOL ?Take 2 tablets (650 mg total) by mouth every 6 (six) hours as needed for mild pain, fever or headache (or Fever >/= 101). Discontinue 500/1000 mg dosing ?  ?apixaban 5 MG Tabs tablet ?Commonly known as: ELIQUIS ?Take 1 tablet (5 mg total) by mouth 2 (two) times daily. ?  ?atorvastatin 10 MG tablet ?Commonly known as: LIPITOR ?Take 1 tablet (10 mg total) by mouth daily. ?  ?carvedilol 6.25 MG tablet ?Commonly known as: COREG ?Take 1 tablet (6.25 mg total) by mouth 2 (two) times daily with a meal. ?  ?diazepam 5 MG tablet ?Commonly known as: VALIUM ?Take 1 tablet (5 mg total) by mouth every 12 (twelve) hours as needed for anxiety. ?  ?DULoxetine 60 MG capsule ?Commonly known as: CYMBALTA ?Take  1 capsule (60 mg total) by mouth daily. ?  ?ferrous sulfate 325 (65 FE) MG tablet ?Take 1 tablet (325 mg total) by mouth 2 (two) times daily with a meal. ?  ?folic acid 1 MG tablet ?Commonly known as: FOLVITE ?Take 2 tablets by mouth once daily ?  ?FreeStyle Libre 14 Day Reader Kerrin Mo ?1 Device by Does not apply route daily. ?  ?FreeStyle Libre 14 Day Sensor Misc ?1 Device by Does not apply route daily. ?  ?furosemide 20 MG tablet ?Commonly known as: LASIX ?Take 2 tablets (40 mg total) by mouth daily. ?  ?HYDROcodone-acetaminophen 5-325 MG tablet ?Commonly known as: NORCO/VICODIN ?Take 1 tablet by mouth every 6 (six) hours as needed for moderate pain. ?  ?Lantus SoloStar 100 UNIT/ML Solostar  Pen ?Generic drug: insulin glargine ?INJECT 20 UNITS SUBCUTANEOUSLY IN THE MORNING AND 40 AT NIGHT ?What changed:  ?how much to take ?when to take this ?additional instructions ?  ?montelukast 10 MG tablet ?Commonly known as: SINGULAIR ?TAKE 1 TABLET BY MOUTH AT BEDTIME ?  ?multivitamin with minerals Tabs tablet ?Take 1 tablet by mouth daily. ?  ?NONFORMULARY OR COMPOUNDED ITEM ?Power wheelchair  #1  as directed ?  ?ondansetron 8 MG tablet ?Commonly known as: Zofran ?Take 0.5 tablets (4 mg total) by mouth every 8 (eight) hours as needed for nausea or vomiting. ?What changed: how much to take ?  ?pregabalin 75 MG capsule ?Commonly known as: LYRICA ?Take 1 capsule (75 mg total) by mouth 2 (two) times daily. ?  ?VITAMIN D PO ?Take 125 mcg by mouth daily. ?  ? ?  ? ? ?Allergies:  ?Allergies  ?Allergen Reactions  ? Bee Pollen Anaphylaxis  ? Fish-Derived Products Hives, Shortness Of Breath, Swelling and Rash  ?  Hives get in throat causing trouble breathing  ? Mushroom Extract Complex Anaphylaxis  ? Penicillins Anaphylaxis  ?  **Tolerated cefepime March 2021 ?Did it involve swelling of the face/tongue/throat, SOB, or low BP? Yes ?Did it involve sudden or severe rash/hives, skin peeling, or any reaction on the inside of your mouth or nose? No ?Did you need to seek medical attention at a hospital or doctor's office? Yes ?When did it last happen?  A few months ago ?If all above answers are "NO", may proceed with cephalosporin use.  ? Rosemary Oil Anaphylaxis  ? Shellfish Allergy Hives, Shortness Of Breath, Swelling and Rash  ? Tomato Hives and Shortness Of Breath  ?  Hives in throat causes her trouble breathing  ? Acetaminophen Other (See Comments)  ?  GI upset  ? Aloe Vera Hives  ? Broccoli [Brassica Oleracea] Hives  ? Acyclovir And Related Other (See Comments)  ?  Unknown reaction  ? Naproxen Other (See Comments)  ?  Unknown reaction  ? ? ?Past Medical History, Surgical history, Social history, and Family History were  reviewed and updated. ? ?Review of Systems: ?All other 10 point review of systems is negative.  ? ?Physical Exam: ? vitals were not taken for this visit.  ? ?Wt Readings from Last 3 Encounters:  ?01/06/22 169 lb (76.7 kg)  ?12/19/21 206 lb 11.2 oz (93.8 kg)  ?12/01/21 206 lb 12.7 oz (93.8 kg)  ? ? ?Ocular: Sclerae unicteric, pupils equal, round and reactive to light ?Ear-nose-throat: Oropharynx clear, dentition fair ?Lymphatic: No cervical or supraclavicular adenopathy ?Lungs no rales or rhonchi, good excursion bilaterally ?Heart regular rate and rhythm, no murmur appreciated ?Abd soft, nontender, positive bowel sounds ?MSK no focal spinal tenderness,  no joint edema ?Neuro: non-focal, well-oriented, appropriate affect ?Breasts: Deferred  ? ?Lab Results  ?Component Value Date  ? WBC 4.1 01/18/2022  ? HGB 7.7 (L) 01/18/2022  ? HCT 25.3 (L) 01/18/2022  ? MCV 94.4 01/18/2022  ? PLT 134 (L) 01/18/2022  ? ?Lab Results  ?Component Value Date  ? FERRITIN 549 (H) 01/18/2022  ? IRON 46 01/18/2022  ? TIBC 213 (L) 01/18/2022  ? UIBC 167 01/18/2022  ? IRONPCTSAT 22 01/18/2022  ? ?Lab Results  ?Component Value Date  ? RETICCTPCT 1.6 02/08/2022  ? RBC 3.24 (L) 02/08/2022  ? ?No results found for: KPAFRELGTCHN, LAMBDASER, KAPLAMBRATIO ?No results found for: IGGSERUM, IGA, IGMSERUM ?No results found for: TOTALPROTELP, ALBUMINELP, A1GS, A2GS, BETS, BETA2SER, GAMS, MSPIKE, SPEI ?  Chemistry   ?   ?Component Value Date/Time  ? NA 134 (L) 01/18/2022 1250  ? K 5.0 01/18/2022 1250  ? CL 106 01/18/2022 1250  ? CO2 21 (L) 01/18/2022 1250  ? BUN 54 (H) 01/18/2022 1250  ? CREATININE 3.06 (HH) 01/18/2022 1250  ? CREATININE 1.97 (H) 06/24/2020 1431  ?    ?Component Value Date/Time  ? CALCIUM 8.6 (L) 01/18/2022 1250  ? ALKPHOS 63 01/18/2022 1250  ? AST 14 (L) 01/18/2022 1250  ? ALT 5 01/18/2022 1250  ? BILITOT 0.5 01/18/2022 1250  ?  ? ? ? ?Impression and Plan: [name] [name] is a very pleasant 66 yo caucasian female with long standing history of  iron deficiency anemia as well as history of DVT's in both the right and left peroneal and posterior tibial veins while she was bed bound in the hospital being treated for osteomyelitis.  ?She had an elevated homocystin

## 2022-02-09 LAB — IRON AND IRON BINDING CAPACITY (CC-WL,HP ONLY)
Iron: 76 ug/dL (ref 28–170)
Saturation Ratios: 30 % (ref 10.4–31.8)
TIBC: 255 ug/dL (ref 250–450)
UIBC: 179 ug/dL (ref 148–442)

## 2022-02-09 LAB — FERRITIN: Ferritin: 448 ng/mL — ABNORMAL HIGH (ref 11–307)

## 2022-02-10 ENCOUNTER — Encounter (HOSPITAL_BASED_OUTPATIENT_CLINIC_OR_DEPARTMENT_OTHER): Payer: PRIVATE HEALTH INSURANCE | Admitting: General Surgery

## 2022-02-10 DIAGNOSIS — E11621 Type 2 diabetes mellitus with foot ulcer: Secondary | ICD-10-CM | POA: Diagnosis not present

## 2022-02-10 NOTE — Progress Notes (Signed)
[name] [name] ([id number]) ?Visit Report for 02/10/2022 ?Chief Complaint Document Details ?Patient Name: Date of Service: ?[name] [name] RE, [name] 02/10/2022 3:30 PM ?Medical Record Number: [id number] ?Patient Account Number: [id number] ?Date of Birth/Sex: Treating RN: ?08-Jan-1956 (66 y.o. F) ?Primary Care Provider: Roma [name] Other Clinician: ?Referring Provider: ?Treating Provider/Extender: [name] [name] ?[name] [name] ?Weeks in Treatment: 10 ?Information Obtained from: Patient ?Chief Complaint ?Left heel ulcer ?01/10/2021; patient returns to clinic with 2 large wounds on her bilateral heels ?11/29/2021; patient returns to clinic with substantial wounds on the right foot and left heel. ?Electronic Signature(s) ?Signed: 02/10/2022 4:37:44 PM By: [name] Maudlin MD FACS ?Entered By: [name] [name] on 02/10/2022 16:37:44 ?-------------------------------------------------------------------------------- ?Debridement Details ?Patient Name: Date of Service: ?[name] [name] RE, [name] 02/10/2022 3:30 PM ?Medical Record Number: [id number] ?Patient Account Number: [id number] ?Date of Birth/Sex: Treating RN: ?11-07-1955 (66 y.o. F) [name], [name] ?Primary Care Provider: Roma [name] Other Clinician: ?Referring Provider: ?Treating Provider/Extender: [name] [name] ?[name] [name] ?Weeks in Treatment: 10 ?Debridement Performed for Assessment: Wound #12 Right,Plantar Foot ?Performed By: Physician [name] Maudlin, MD ?Debridement Type: Debridement ?Severity of Tissue Pre Debridement: Fat layer exposed ?Level of Consciousness (Pre-procedure): Awake and Alert ?Pre-procedure Verification/Time Out Yes - 16:05 ?Taken: ?Start Time: 16:09 ?Pain Control: Lidocaine 4% T opical Solution ?T Area Debrided (L x W): ?otal 0.8 (cm) x 0.8 (cm) = 0.64 (cm?) ?Tissue and other material debrided: ?Viable, Non-Viable, Callus, Subcutaneous, Skin: Epidermis ?Level: Skin/Subcutaneous Tissue ?Debridement Description:  Excisional ?Instrument: Curette ?Bleeding: Minimum ?Hemostasis Achieved: Pressure ?Procedural Pain: 0 ?Post Procedural Pain: 0 ?Response to Treatment: Procedure was tolerated well ?Level of Consciousness (Post- Awake and Alert ?procedure): ?Post Debridement Measurements of Total Wound ?Length: (cm) 0.4 ?Width: (cm) 0.4 ?Depth: (cm) 0.2 ?Volume: (cm?) 0.025 ?Character of Wound/Ulcer Post Debridement: Improved ?Severity of Tissue Post Debridement: Fat layer exposed ?Post Procedure Diagnosis ?Same as Pre-procedure ?Electronic Signature(s) ?Signed: 02/10/2022 5:31:06 PM By: [name] Maudlin MD FACS ?Signed: 02/10/2022 6:26:26 PM By: [name] Gouty RN, BSN ?Entered By: [name] [name] on 02/10/2022 16:12:25 ?-------------------------------------------------------------------------------- ?Debridement Details ?Patient Name: ?Date of Service: ?[name], [name] 02/10/2022 3:30 PM ?Medical Record Number: [id number] ?Patient Account Number: [id number] ?Date of Birth/Sex: ?Treating RN: ?10/21/1956 (66 y.o. F) [name], [name] ?Primary Care Provider: Roma [name] ?Other Clinician: ?Referring Provider: ?Treating Provider/Extender: [name] [name] ?[name] [name] ?Weeks in Treatment: 10 ?Debridement Performed for Assessment: Wound #11 Left Calcaneus ?Performed By: Physician [name] Maudlin, MD ?Debridement Type: Debridement ?Severity of Tissue Pre Debridement: Fat layer exposed ?Level of Consciousness (Pre-procedure): Awake and Alert ?Pre-procedure Verification/Time Out Yes - 16:05 ?Taken: ?Start Time: 16:09 ?Pain Control: Lidocaine 4% T opical Solution ?T Area Debrided (L x W): ?otal 4.5 (cm) x 3.5 (cm) = 15.75 (cm?) ?Tissue and other material debrided: ?Non-Viable, Callus, Skin: Epidermis ?Level: Skin/Epidermis ?Debridement Description: Selective/Open Wound ?Instrument: Curette ?Bleeding: Minimum ?Hemostasis Achieved: Pressure ?Procedural Pain: 0 ?Post Procedural Pain: 0 ?Response to Treatment: Procedure was  tolerated well ?Level of Consciousness (Post- Awake and Alert ?procedure): ?Post Debridement Measurements of Total Wound ?Length: (cm) 2.2 ?Width: (cm) 1.5 ?Depth: (cm) 0.3 ?Volume: (cm?) 0.778 ?Character of Wound/Ulcer Post Debridement: Improved ?Severity of Tissue Post Debridement: Fat layer exposed ?Post Procedure Diagnosis ?Same as Pre-procedure ?Electronic Signature(s) ?Signed: 02/10/2022 5:31:06 PM By: [name] Maudlin MD FACS ?Signed: 02/10/2022 6:26:26 PM By: [name] Gouty RN, BSN ?Entered By: [name] [name] on 02/10/2022 16:17:09 ?-------------------------------------------------------------------------------- ?HPI Details ?Patient Name: Date of Service: ?[name] [name] RE, [name] 02/10/2022 3:30 PM ?Medical Record Number: [id number] ?Patient Account  Number: [id number] ?Date of Birth/Sex: Treating RN: ?1956-02-22 (66 y.o. F) ?Primary Care Provider: Roma [name] Other Clinician: ?Referring Provider: ?Treating Provider/Extender: [name] [name] ?[name] [name] ?Weeks in Treatment: 10 ?History of Present Illness ?HPI Description: ADMISSION ?11/12/2018 ?This is a 66 year old woman with type 2 diabetes and diabetic neuropathy. She has been dealing with a left heel plantar wound for roughly 2 years. She states ?this started when she pulled some skin off the area and it progressed into a wound. She also has an area on the tip of her left great toe for 1 year. She has ?been largely followed by Dr. Sharol [name] and she had an excision of bone in the left heel in 2017. I do not see microbiology from this excision or pathology. ?Apparently this wound never really closed. She most recently has been using Silvadene cream. Offloading this with a scooter. The last MRI I see was in June ?2018 which did not show osteomyelitis at that time. Apparently this wound is never really progressed towards healing. During her last review by Dr. Sharol [name] in ?October it was recommended that she undergo a left BKA and she refused. She went to see  a second orthopedic consult at [facility] who ?am ?recommended conservative wound care to see if this will close or progress towards closure but also warned that possible surgery may be necessary. An x-ray ?that was done at [facility] showed an irregular calcaneal body and calcaneal tuberosity. This is probably postprocedural. Previous x-rays at [facility] had suggested ?heterotrophic calcifications but I do not see this. Apparently a second ointment was added to the Silvadene which the patient thinks is because some ?improvement. She has not been systemically unwell. No fever or chills. She thinks the second ointment that was [name] to her at [facility] has helped somewhat. 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 ?ABIs done in June 2018 showed a ABI of 1.12 on the right and 1.1 on the left she was biphasic bilaterally. ABIs in our clinic were noncompressible today. ?11/20/18 on evaluation today patient is seen for her second visit here in the office although this is actually the first visit with me concerning an issue that she's ?having with her great toe and heel of the left foot. Fortunately she does not appear to be have any discomfort at this time and she does have a scooter in ?order to offload her foot she also has a boot for offloading. With that being said this is something that appears to have been going on for some time she was ?seeing Dr. due to his recommendation was that she was going to require a below knee amputation. With that being said she wanted to come to the wound center ?but according to the patient Dr. Sharol [name] told her that "we could not help her". Nonetheless she saw another provider who suggested that  it may be worth a shot for ?[name] to try and help her out if it all possible. Nonetheless she decided that it would be worth a trial since otherwise any the way she's gonna end up with an ?amputation. Obviously if we can heal the

## 2022-02-14 ENCOUNTER — Encounter: Payer: Self-pay | Admitting: Family Medicine

## 2022-02-14 ENCOUNTER — Telehealth: Payer: Self-pay

## 2022-02-14 ENCOUNTER — Ambulatory Visit (INDEPENDENT_AMBULATORY_CARE_PROVIDER_SITE_OTHER): Payer: PRIVATE HEALTH INSURANCE | Admitting: Family Medicine

## 2022-02-14 VITALS — BP 115/60 | HR 65 | Temp 97.6°F | Ht 61.0 in | Wt 168.0 lb

## 2022-02-14 DIAGNOSIS — N186 End stage renal disease: Secondary | ICD-10-CM

## 2022-02-14 DIAGNOSIS — D508 Other iron deficiency anemias: Secondary | ICD-10-CM

## 2022-02-14 DIAGNOSIS — I509 Heart failure, unspecified: Secondary | ICD-10-CM

## 2022-02-14 DIAGNOSIS — E785 Hyperlipidemia, unspecified: Secondary | ICD-10-CM

## 2022-02-14 DIAGNOSIS — I252 Old myocardial infarction: Secondary | ICD-10-CM

## 2022-02-14 DIAGNOSIS — K766 Portal hypertension: Secondary | ICD-10-CM

## 2022-02-14 DIAGNOSIS — Z79899 Other long term (current) drug therapy: Secondary | ICD-10-CM | POA: Diagnosis not present

## 2022-02-14 DIAGNOSIS — F32A Depression, unspecified: Secondary | ICD-10-CM

## 2022-02-14 DIAGNOSIS — Z992 Dependence on renal dialysis: Secondary | ICD-10-CM

## 2022-02-14 DIAGNOSIS — E1165 Type 2 diabetes mellitus with hyperglycemia: Secondary | ICD-10-CM

## 2022-02-14 DIAGNOSIS — E039 Hypothyroidism, unspecified: Secondary | ICD-10-CM

## 2022-02-14 DIAGNOSIS — T7840XD Allergy, unspecified, subsequent encounter: Secondary | ICD-10-CM | POA: Diagnosis not present

## 2022-02-14 DIAGNOSIS — J4540 Moderate persistent asthma, uncomplicated: Secondary | ICD-10-CM

## 2022-02-14 DIAGNOSIS — E1169 Type 2 diabetes mellitus with other specified complication: Secondary | ICD-10-CM | POA: Diagnosis not present

## 2022-02-14 DIAGNOSIS — I82403 Acute embolism and thrombosis of unspecified deep veins of lower extremity, bilateral: Secondary | ICD-10-CM | POA: Diagnosis not present

## 2022-02-14 DIAGNOSIS — I5033 Acute on chronic diastolic (congestive) heart failure: Secondary | ICD-10-CM

## 2022-02-14 DIAGNOSIS — I2510 Atherosclerotic heart disease of native coronary artery without angina pectoris: Secondary | ICD-10-CM

## 2022-02-14 DIAGNOSIS — Z794 Long term (current) use of insulin: Secondary | ICD-10-CM

## 2022-02-14 DIAGNOSIS — N185 Chronic kidney disease, stage 5: Secondary | ICD-10-CM

## 2022-02-14 DIAGNOSIS — I10 Essential (primary) hypertension: Secondary | ICD-10-CM

## 2022-02-14 DIAGNOSIS — D6859 Other primary thrombophilia: Secondary | ICD-10-CM

## 2022-02-14 LAB — CBC WITH DIFFERENTIAL/PLATELET
Basophils Absolute: 0.1 10*3/uL (ref 0.0–0.1)
Basophils Relative: 1.3 % (ref 0.0–3.0)
Eosinophils Absolute: 1.2 10*3/uL — ABNORMAL HIGH (ref 0.0–0.7)
Eosinophils Relative: 20.2 % — ABNORMAL HIGH (ref 0.0–5.0)
HCT: 28.4 % — ABNORMAL LOW (ref 36.0–46.0)
Hemoglobin: 9.1 g/dL — ABNORMAL LOW (ref 12.0–15.0)
Lymphocytes Relative: 12.6 % (ref 12.0–46.0)
Lymphs Abs: 0.8 10*3/uL (ref 0.7–4.0)
MCHC: 31.9 g/dL (ref 30.0–36.0)
MCV: 90.8 fl (ref 78.0–100.0)
Monocytes Absolute: 0.5 10*3/uL (ref 0.1–1.0)
Monocytes Relative: 7.4 % (ref 3.0–12.0)
Neutro Abs: 3.6 10*3/uL (ref 1.4–7.7)
Neutrophils Relative %: 58.5 % (ref 43.0–77.0)
Platelets: 139 10*3/uL — ABNORMAL LOW (ref 150.0–400.0)
RBC: 3.13 Mil/uL — ABNORMAL LOW (ref 3.87–5.11)
RDW: 17.3 % — ABNORMAL HIGH (ref 11.5–15.5)
WBC: 6.1 10*3/uL (ref 4.0–10.5)

## 2022-02-14 LAB — COMPREHENSIVE METABOLIC PANEL
ALT: 8 U/L (ref 0–35)
AST: 14 U/L (ref 0–37)
Albumin: 3.7 g/dL (ref 3.5–5.2)
Alkaline Phosphatase: 76 U/L (ref 39–117)
BUN: 59 mg/dL — ABNORMAL HIGH (ref 6–23)
CO2: 22 mEq/L (ref 19–32)
Calcium: 8.6 mg/dL (ref 8.4–10.5)
Chloride: 109 mEq/L (ref 96–112)
Creatinine, Ser: 3.37 mg/dL — ABNORMAL HIGH (ref 0.40–1.20)
GFR: 13.7 mL/min — CL (ref >60.00)
Glucose, Bld: 78 mg/dL (ref 70–99)
Potassium: 5.6 mEq/L — ABNORMAL HIGH (ref 3.5–5.1)
Sodium: 137 mEq/L (ref 135–145)
Total Bilirubin: 0.6 mg/dL (ref 0.2–1.2)
Total Protein: 7.8 g/dL (ref 6.0–8.3)

## 2022-02-14 LAB — HEMOGLOBIN A1C: Hgb A1c MFr Bld: 4.6 % (ref 4.6–6.5)

## 2022-02-14 MED ORDER — ALBUTEROL SULFATE HFA 108 (90 BASE) MCG/ACT IN AERS: 2.0000 | INHALATION_SPRAY | Freq: Four times a day (QID) | RESPIRATORY_TRACT | 2 refills | Status: DC | PRN

## 2022-02-14 MED ORDER — MONTELUKAST SODIUM 10 MG PO TABS: 10.0000 mg | ORAL_TABLET | Freq: Every day | ORAL | 3 refills | Status: DC

## 2022-02-14 MED ORDER — ADVAIR HFA 115-21 MCG/ACT IN AERO: 2.0000 | INHALATION_SPRAY | Freq: Two times a day (BID) | RESPIRATORY_TRACT | 12 refills | Status: DC

## 2022-02-14 MED ORDER — ALBUTEROL SULFATE (2.5 MG/3ML) 0.083% IN NEBU
2.5000 mg | INHALATION_SOLUTION | Freq: Four times a day (QID) | RESPIRATORY_TRACT | 1 refills | Status: DC | PRN
Start: 2022-02-14 — End: 2023-02-04

## 2022-02-14 MED ORDER — APIXABAN 2.5 MG PO TABS: 2.5000 mg | ORAL_TABLET | Freq: Two times a day (BID) | ORAL | 5 refills | Status: DC

## 2022-02-14 NOTE — Assessment & Plan Note (Signed)
F/u [place] kidney  ? ?

## 2022-02-14 NOTE — Assessment & Plan Note (Signed)
Encourage heart healthy diet such as MIND or DASH diet, increase exercise, avoid trans fats, simple carbohydrates and processed foods, consider a krill or fish or flaxseed oil cap daily.  °

## 2022-02-14 NOTE — Assessment & Plan Note (Signed)
Per cardiology 

## 2022-02-14 NOTE — Assessment & Plan Note (Signed)
Well controlled, no changes to meds. Encouraged heart healthy diet such as the DASH diet and exercise as tolerated.  °

## 2022-02-14 NOTE — Assessment & Plan Note (Signed)
Per endo °

## 2022-02-14 NOTE — Patient Instructions (Addendum)
Asthma, Adult ? ?Asthma is a long-term (chronic) condition that causes recurrent episodes in which the lower airways in the lungs become tight and narrow. The narrowing is caused by inflammation and tightening of the smooth muscle around the lower airways. ?Asthma episodes, also called asthma attacks or asthma flares, may cause coughing, making high-pitched whistling sounds when you breathe, most often when you breathe out (wheezing), shortness of breath, and chest pain. The airways may produce extra mucus caused by the inflammation and irritation. During an attack, it can be difficult to breathe. Asthma attacks can range from minor to life-threatening. ?Asthma cannot be cured, but medicines and lifestyle changes can help control it and treat acute attacks. It is important to keep your asthma well controlled so the condition does not interfere with your daily life. ?What are the causes? ?This condition is believed to be caused by inherited (genetic) and environmental factors, but its exact cause is not known. ?What can trigger an asthma attack? ?Many things can bring on an asthma attack or make symptoms worse. These triggers are different for every person. Common triggers include: ?Allergens and irritants like mold, dust, pet dander, cockroaches, pollen, air pollution, and chemical odors. ?Cigarette smoke. ?Weather changes and cold air. ?Stress and strong emotional responses such as crying or laughing hard. ?Certain medications such as aspirin or beta blockers. ?Infections and inflammatory conditions, such as the flu, a cold, pneumonia, or inflammation of the nasal membranes (rhinitis). ?Gastroesophageal reflux disease (GERD). ?What are the signs or symptoms? ?Symptoms may occur right after exposure to an asthma trigger or hours later and can vary by person. Common signs and symptoms include: ?Wheezing. ?Trouble breathing (shortness of breath). ?Excessive nighttime or early morning coughing. ?Chest  tightness. ?Tiredness (fatigue) with minimal activity. ?Difficulty talking in complete sentences. ?Poor exercise tolerance. ?How is this diagnosed? ?This condition is diagnosed based on: ?A physical exam and your medical history. ?Tests, which may include: ?Lung function studies to evaluate the flow of air in your lungs. ?Allergy tests. ?Imaging tests, such as X-rays. ?How is this treated? ?There is no cure, but symptoms can be controlled with proper treatment. Treatment usually involves: ?Identifying and avoiding your asthma triggers. ?Inhaled medicines. Two types are commonly used to treat asthma, depending on severity: ?Controller medicines. These help prevent asthma symptoms from occurring. They are taken every day. ?Fast-acting reliever or rescue medicines. These quickly relieve asthma symptoms. They are used as needed and provide short-term relief. ?Using other medicines, such as: ?Allergy medicines, such as antihistamines, if your asthma attacks are triggered by allergens. ?Immune medicines (immunomodulators). These are medicines that help control the immune system. ?Using supplemental oxygen. This is only needed during a severe episode. ?Creating an asthma action plan. An asthma action plan is a written plan for managing and treating your asthma attacks. This plan includes: ?A list of your asthma triggers and how to avoid them. ?Information about when medicines should be taken and when their dosage should be changed. ?Instructions about using a device called a peak flow meter. A peak flow meter measures how well the lungs are working and the severity of your asthma. It helps you monitor your condition. ?Follow these instructions at home: ?Take over-the-counter and prescription medicines only as told by your health care provider. ?Stay up to date on all vaccinations as recommended by your healthcare provider, including vaccines for the flu and pneumonia. ?Use a peak flow meter and keep track of your peak flow  readings. ?Understand and use your asthma  action plan to address any asthma flares. ?Do not smoke or allow anyone to smoke in your home. ?Contact a health care provider if: ?You have wheezing, shortness of breath, or a cough that is not responding to medicines. ?Your medicines are causing side effects, such as a rash, itching, swelling, or trouble breathing. ?You need to use a reliever medicine more than 2-3 times a week. ?Your peak flow reading is still at 50-79% of your personal best after following your action plan for 1 hour. ?You have a fever and shortness of breath. ?Get help right away if: ?You are getting worse and do not respond to treatment during an asthma attack. ?You are short of breath when at rest or when doing very little physical activity. ?You have difficulty eating, drinking, or talking. ?You have chest pain or tightness. ?You develop a fast heartbeat or palpitations. ?You have a bluish color to your lips or fingernails. ?You are light-headed or dizzy, or you faint. ?Your peak flow reading is less than 50% of your personal best. ?You feel too tired to breathe normally. ?These symptoms may be an emergency. Get help right away. Call 911. ?Do not wait to see if the symptoms will go away. ?Do not drive yourself to the hospital. ?Summary ?Asthma is a long-term (chronic) condition that causes recurrent episodes in which the airways become tight and narrow. Asthma episodes, also called asthma attacks or asthma flares, can cause coughing, wheezing, shortness of breath, and chest pain. ?Asthma cannot be cured, but medicines and lifestyle changes can help keep it well controlled and prevent asthma flares. ?Make sure you understand how to avoid triggers and how and when to use your medicines. ?Asthma attacks can range from minor to life-threatening. Get help right away if you have an asthma attack and do not respond to treatment with your usual rescue medicines. ?This information is not intended to replace  advice given to you by your health care provider. Make sure you discuss any questions you have with your health care provider. ?Document Revised: 08/03/2021 Document Reviewed: 07/25/2021 ?Elsevier Patient Education ? [place]. ? ?

## 2022-02-14 NOTE — Assessment & Plan Note (Signed)
Has not bothered her in years ?Neb to be delivered to pt  ?Albuterol and advair  ? ?

## 2022-02-14 NOTE — Assessment & Plan Note (Signed)
Per hematology ?

## 2022-02-14 NOTE — Telephone Encounter (Signed)
CRITICAL VALUE STICKER ? ?CRITICAL VALUE: GFR 13.70 ? ?RECEIVER (on-site recipient of call):Apryll Hinkle, RMA ? ?DATE & TIME NOTIFIED: 02/14/2022 at 4:36 pm  ? ?MESSENGER (representative from lab): Esperanza Richters Leader  ? ?MD NOTIFIED: Garnet Koyanagi- [place]  ? ?TIME OF NOTIFICATION: 4:39 pm ? ?RESPONSE:   ?
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
## 2022-02-14 NOTE — Assessment & Plan Note (Signed)
No chest pain ?stable ?

## 2022-02-14 NOTE — Assessment & Plan Note (Signed)
Check labs  ?con't synthroid  ?

## 2022-02-15 ENCOUNTER — Encounter: Payer: Self-pay | Admitting: *Deleted

## 2022-02-15 NOTE — Telephone Encounter (Signed)
Labs have been faxed.

## 2022-02-15 NOTE — Progress Notes (Signed)
[name], [name] ([id number]) ?Visit Report for 02/10/2022 ?Arrival Information Details ?Patient Name: Date of Service: ?[name] [name] RE, [name] 02/10/2022 3:30 PM ?Medical Record Number: [id number] ?Patient Account Number: [id number] ?Date of Birth/Sex: Treating RN: ?Feb 13, 1956 (67 y.o. F) ?Primary Care Nylan Nakatani: [name] Other Clinician: ?Referring Tristy Udovich: ?Treating Aaliayah Miao/Extender: Fredirick Maudlin ?[name] ?Weeks in Treatment: 10 ?Visit Information History Since Last Visit ?Added or deleted any medications: No ?Patient Arrived: Other ?Any new allergies or adverse reactions: No ?Arrival Time: 15:36 ?Had a fall or experienced change in No ?Accompanied By: husband ?activities of daily living that may affect ?Transfer Assistance: None ?risk of falls: ?Patient Identification Verified: Yes ?Signs or symptoms of abuse/neglect since last visito No ?Secondary Verification Process Completed: Yes ?Hospitalized since last visit: No ?Patient Requires Transmission-Based Precautions: No ?Implantable device outside of the clinic excluding No ?Patient Has Alerts: No ?cellular tissue based products placed in the center ?since last visit: ?Has Dressing in Place as Prescribed: Yes ?Pain Present Now: Yes ?Electronic Signature(s) ?Signed: 02/15/2022 3:16:57 PM By: [name] ?Entered By: [name] on 02/10/2022 15:37:03 ?-------------------------------------------------------------------------------- ?Encounter Discharge Information Details ?Patient Name: Date of Service: ?[name] [name] RE, [name] 02/10/2022 3:30 PM ?Medical Record Number: [id number] ?Patient Account Number: [id number] ?Date of Birth/Sex: Treating RN: ?01/07/1956 (66 y.o. F) [name], [name] ?Primary Care Dariann Huckaba: [name] Other Clinician: ?Referring Jullianna Gabor: ?Treating Navjot Loera/Extender: Fredirick Maudlin ?[name] ?Weeks in Treatment: 10 ?Encounter Discharge Information Items Post Procedure Vitals ?Discharge Condition:  Stable ?Temperature (F): 98.4 ?Ambulatory Status: Wheelchair ?Pulse (bpm): 73 ?Discharge Destination: Home ?Respiratory Rate (breaths/min): 18 ?Transportation: Private Auto ?Blood Pressure (mmHg): 118/66 ?Accompanied By: spouse ?Schedule Follow-up Appointment: Yes ?Clinical Summary of Care: Patient Declined ?Electronic Signature(s) ?Signed: 02/10/2022 6:26:26 PM By: Baruch Gouty RN, BSN ?Entered By: Baruch Gouty on 02/10/2022 16:34:01 ?-------------------------------------------------------------------------------- ?Lower Extremity Assessment Details ?Patient Name: ?Date of Service: ?[name] [name], [name] 02/10/2022 3:30 PM ?Medical Record Number: [id number] ?Patient Account Number: [id number] ?Date of Birth/Sex: ?Treating RN: ?03-04-1956 (66 y.o. F) [name], [name] ?Primary Care Aliena Ghrist: [name] ?Other Clinician: ?Referring Daenerys Buttram: ?Treating Atleigh Gruen/Extender: Fredirick Maudlin ?[name] ?Weeks in Treatment: 10 ?Edema Assessment ?Assessed: [Left: No] [Right: No] ?Edema: [Left: Yes] [Right: Yes] ?Calf ?Left: Right: ?Point of Measurement: 27 cm From Medial Instep 37.5 cm 37.5 cm ?Ankle ?Left: Right: ?Point of Measurement: 9 cm From Medial Instep 23 cm 26.3 cm ?Vascular Assessment ?Pulses: ?Dorsalis Pedis ?Palpable: [Left:Yes] [Right:Yes] ?Electronic Signature(s) ?Signed: 02/10/2022 6:26:26 PM By: Baruch Gouty RN, BSN ?Entered By: Baruch Gouty on 02/10/2022 15:54:45 ?-------------------------------------------------------------------------------- ?Multi Wound Chart Details ?Patient Name: ?Date of Service: ?[name] [name], [name] 02/10/2022 3:30 PM ?Medical Record Number: [id number] ?Patient Account Number: [id number] ?Date of Birth/Sex: ?Treating RN: ?[birth year] (66 y.o. F) ?Primary Care Atisha Hamidi: [name] ?Other Clinician: ?Referring Jalexus Brett: ?Treating Sharnese Heath/Extender: Fredirick Maudlin ?[name] ?Weeks in Treatment: 10 ?Vital Signs ?Height(in): 61 ?Pulse(bpm):  73 ?Weight(lbs): 169 ?Blood Pressure(mmHg): 118/66 ?Body Mass Index(BMI): 31.9 ?Temperature(??F): 98.4 ?Respiratory Rate(breaths/min): 18 ?Photos: [11:Left Calcaneus] [12:Right, Plantar Foot] [15:Right, Lateral Foot] ?Wound Location: [11:Gradually Appeared] [12:Gradually Appeared] [15:Gradually Appeared] ?Wounding Event: [11:Diabetic Wound/Ulcer of the Lower Diabetic Wound/Ulcer of the Lower] [15:Diabetic Wound/Ulcer of the Lower] ?Primary Etiology: [11:Extremity Cataracts, Anemia, Sleep Apnea,] [12:Extremity Cataracts, Anemia, Sleep Apnea,] [15:Extremity Cataracts, Anemia, Sleep Apnea,] ?Comorbid History: [11:Coronary Artery Disease, Hypertension, Myocardial Infarction, Type II Diabetes, Osteoarthritis, Type II Diabetes, Osteoarthritis, Osteomyelitis, Neuropathy 10/30/2020] [12:Coronary Artery Disease, Osteomyelitis, Neuropathy 09/29/2021]  ?[15:Coronary Artery Disease, Hypertension, Myocardial Infarction, Type II Diabetes, Osteoarthritis, Osteomyelitis, Neuropathy  09/29/2021] ?Date Acquired: [11:10] [12:10] [15:10] ?Weeks of Treatment: [11:Open] [12:Open] [15:Healed - Epithelialized] ?Wound Status: [11:No] [12:No] [15:No] ?Wound Recurrence: [11:2.2x1.5x0.3] [12:0.3x0.4x0.2] [15:0x0x0] ?Measurements L x W x D (cm) [11:2.592] [12:0.094] [15:0] ?A (cm?) : ?rea [11:0.778] [12:0.019] [15:0] ?Volume (cm?) : [11:76.60%] [12:94.70%] [15:100.00%] ?% Reduction in A [11:rea: 93.00%] [12:99.10%] [15:100.00%] ?% Reduction in Volume: [12:12] ?Starting Position 1 (o'clock): [12:12] ?Ending Position 1 (o'clock): [12:0.2] ?Maximum Distance 1 (cm): [11:No] [12:Yes] [15:No] ?Undermining: [11:Grade 3] [12:Grade 3] [15:Grade 2] ?Classification: [11:Medium] [12:Small] [15:None Present] ?Exudate A mount: [11:Serosanguineous] [12:Serosanguineous] [15:N/A] ?Exudate Type: [11:red, brown] [12:red, brown] [15:N/A] ?Exudate Color: [11:Thickened] [12:Well defined, not attached] [15:Flat and Intact] ?Wound Margin: [11:Large (67-100%)] [12:Large  (67-100%)] [15:None Present (0%)] ?Granulation A mount: [11:Red] [12:Red] [15:N/A] ?Granulation Quality: [11:Small (1-33%)] [12:Small (1-33%)] [15:None Present (0%)] ?Necrotic A mount: ?[11:Fat Layer (Subcutaneous Tissue): Yes Fat Layer (Subcutaneous Tissue): Yes Fascia: No] ?Exposed Structures: ?[11:Fascia: No Tendon: No Muscle: No Joint: No Bone: No None] [12:Fascia: No Tendon: No Muscle: No Joint: No Bone: No None] [15:Fat Layer (Subcutaneous Tissue): No Tendon: No Muscle: No Joint: No Bone: No Large (67-100%)] ?Epithelialization: [11:Debridement - Selective/Open Wound Debridement - Excisional] [15:N/A] ?Debridement: ?Pre-procedure Verification/Time Out 16:05 [12:16:05] [15:N/A] ?Taken: [11:Lidocaine 4% Topical Solution] [12:Lidocaine 4% Topical Solution] [15:N/A] ?Pain Control: [11:Callus] [12:Callus, Subcutaneous] [15:N/A] ?Tissue Debrided: [11:Skin/Epidermis] [12:Skin/Subcutaneous Tissue] [15:N/A] ?Level: [11:15.75] [12:0.64] [15:N/A] ?Debridement A (sq cm): [11:rea Curette] [12:Curette] [15:N/A] ?Instrument: [11:Minimum] [12:Minimum] [15:N/A] ?Bleeding: [11:Pressure] [12:Pressure] [15:N/A] ?Hemostasis A chieved: [11:0] [12:0] [15:N/A] ?Procedural Pain: [11:0] [12:0] [15:N/A] ?Post Procedural Pain: [11:Procedure was tolerated well] [12:Procedure was tolerated well] [15:N/A] ?Debridement Treatment Response: [11:2.2x1.5x0.3] [12:0.4x0.4x0.2] [15:N/A] ?Post Debridement Measurements L x ?W x D (cm) [11:0.778] [12:0.025] [15:N/A] ?Post Debridement Volume: (cm?) [11:N/A] [12:N/A] [15:scabbed] ?Assessment Notes: [11:Debridement] [12:Debridement] [15:N/A] ?Treatment Notes ?Wound #11 (Calcaneus) Wound Laterality: Left ?Cleanser ?Normal Saline ?Discharge Instruction: Cleanse the wound with Normal Saline prior to applying a clean dressing using gauze sponges, not tissue or cotton balls. ?Wound Cleanser ?Discharge Instruction: Cleanse the wound with wound cleanser prior to applying a clean dressing using gauze sponges,  not tissue or cotton balls. ?Peri-Wound Care ?Zinc Oxide Ointment 30g tube ?Discharge Instruction: Apply Zinc Oxide to periwound with each dressing change ?Topical ?Primary Dressing ?KerraCel Ag Gelling Fiber Dressing, 4

## 2022-02-17 ENCOUNTER — Encounter (HOSPITAL_BASED_OUTPATIENT_CLINIC_OR_DEPARTMENT_OTHER): Payer: PRIVATE HEALTH INSURANCE | Admitting: General Surgery

## 2022-02-17 DIAGNOSIS — E11621 Type 2 diabetes mellitus with foot ulcer: Secondary | ICD-10-CM | POA: Diagnosis not present

## 2022-02-18 LAB — DRUG TOX MONITOR 1 W/CONF, ORAL FLD
Alprazolam: NEGATIVE ng/mL (ref <0.50)
Amphetamines: NEGATIVE ng/mL (ref <10)
Barbiturates: NEGATIVE ng/mL (ref <10)
Benzodiazepines: POSITIVE ng/mL — AB (ref <0.50)
Buprenorphine: NEGATIVE ng/mL (ref <0.10)
Chlordiazepoxide: NEGATIVE ng/mL (ref <0.50)
Clonazepam: NEGATIVE ng/mL (ref <0.50)
Cocaine: NEGATIVE ng/mL (ref <5.0)
Codeine: NEGATIVE ng/mL (ref <2.5)
Diazepam: NEGATIVE ng/mL (ref <0.50)
Dihydrocodeine: 13.5 ng/mL — ABNORMAL HIGH (ref <2.5)
Fentanyl: NEGATIVE ng/mL (ref <0.10)
Flunitrazepam: NEGATIVE ng/mL (ref <0.50)
Flurazepam: NEGATIVE ng/mL (ref <0.50)
Heroin Metabolite: NEGATIVE ng/mL (ref <1.0)
Hydrocodone: 75.6 ng/mL — ABNORMAL HIGH (ref <2.5)
Hydromorphone: NEGATIVE ng/mL (ref <2.5)
Lorazepam: NEGATIVE ng/mL (ref <0.50)
MARIJUANA: NEGATIVE ng/mL (ref <2.5)
MDMA: NEGATIVE ng/mL (ref <10)
Meprobamate: NEGATIVE ng/mL (ref <2.5)
Methadone: NEGATIVE ng/mL (ref <5.0)
Midazolam: NEGATIVE ng/mL (ref <0.50)
Morphine: NEGATIVE ng/mL (ref <2.5)
Nicotine Metabolite: NEGATIVE ng/mL (ref <5.0)
Nordiazepam: 0.69 ng/mL — ABNORMAL HIGH (ref <0.50)
Norhydrocodone: 7.1 ng/mL — ABNORMAL HIGH (ref <2.5)
Noroxycodone: NEGATIVE ng/mL (ref <2.5)
Opiates: POSITIVE ng/mL — AB (ref <2.5)
Oxazepam: NEGATIVE ng/mL (ref <0.50)
Oxycodone: NEGATIVE ng/mL (ref <2.5)
Oxymorphone: NEGATIVE ng/mL (ref <2.5)
Phencyclidine: NEGATIVE ng/mL (ref <10)
Tapentadol: NEGATIVE ng/mL (ref <5.0)
Temazepam: NEGATIVE ng/mL (ref <0.50)
Tramadol: NEGATIVE ng/mL (ref <5.0)
Triazolam: NEGATIVE ng/mL (ref <0.50)
Zolpidem: NEGATIVE ng/mL (ref <5.0)

## 2022-02-21 ENCOUNTER — Ambulatory Visit (INDEPENDENT_AMBULATORY_CARE_PROVIDER_SITE_OTHER): Payer: PRIVATE HEALTH INSURANCE | Admitting: Internal Medicine

## 2022-02-21 ENCOUNTER — Other Ambulatory Visit: Payer: Self-pay

## 2022-02-21 ENCOUNTER — Telehealth: Payer: Self-pay

## 2022-02-21 ENCOUNTER — Ambulatory Visit: Payer: PRIVATE HEALTH INSURANCE | Admitting: *Deleted

## 2022-02-21 DIAGNOSIS — J4540 Moderate persistent asthma, uncomplicated: Secondary | ICD-10-CM

## 2022-02-21 DIAGNOSIS — D631 Anemia in chronic kidney disease: Secondary | ICD-10-CM | POA: Diagnosis not present

## 2022-02-21 DIAGNOSIS — I10 Essential (primary) hypertension: Secondary | ICD-10-CM

## 2022-02-21 DIAGNOSIS — F32A Depression, unspecified: Secondary | ICD-10-CM

## 2022-02-21 DIAGNOSIS — E1169 Type 2 diabetes mellitus with other specified complication: Secondary | ICD-10-CM

## 2022-02-21 DIAGNOSIS — E669 Obesity, unspecified: Secondary | ICD-10-CM

## 2022-02-21 DIAGNOSIS — N185 Chronic kidney disease, stage 5: Secondary | ICD-10-CM | POA: Diagnosis not present

## 2022-02-21 DIAGNOSIS — N186 End stage renal disease: Secondary | ICD-10-CM

## 2022-02-21 DIAGNOSIS — E1165 Type 2 diabetes mellitus with hyperglycemia: Secondary | ICD-10-CM

## 2022-02-21 DIAGNOSIS — E785 Hyperlipidemia, unspecified: Secondary | ICD-10-CM

## 2022-02-21 DIAGNOSIS — N1832 Chronic kidney disease, stage 3b: Secondary | ICD-10-CM

## 2022-02-21 DIAGNOSIS — I2510 Atherosclerotic heart disease of native coronary artery without angina pectoris: Secondary | ICD-10-CM

## 2022-02-21 NOTE — Progress Notes (Signed)
DANIKA, KLUENDER ([id number]) ?Visit Report for 02/17/2022 ?Arrival Information Details ?Patient Name: Date of Service: ?[name], [name] 02/17/2022 3:30 PM ?Medical Record Number: [id number] ?Patient Account Number: [id number] ?Date of Birth/Sex: Treating RN: ?[birth year] (66 y.o. F) Deaton, Bobbi ?Primary Care Hesston Hitchens: [name] Other Clinician: ?Referring Britiany Silbernagel: ?Treating Milinda Sweeney/Extender: Fredirick Maudlin ?[name] ?Weeks in Treatment: 11 ?Visit Information History Since Last Visit ?Added or deleted any medications: No ?Patient Arrived: Ambulatory ?Any new allergies or adverse reactions: No ?Arrival Time: 15:31 ?Had a fall or experienced change in No ?Accompanied By: husband ?activities of daily living that may affect ?Transfer Assistance: None ?risk of falls: ?Patient Identification Verified: Yes ?Signs or symptoms of abuse/neglect since last visito No ?Secondary Verification Process Completed: Yes ?Hospitalized since last visit: No ?Patient Requires Transmission-Based Precautions: No ?Implantable device outside of the clinic excluding No ?Patient Has Alerts: No ?cellular tissue based products placed in the center ?since last visit: ?Has Dressing in Place as Prescribed: Yes ?Pain Present Now: Yes ?Electronic Signature(s) ?Signed: 02/20/2022 2:08:13 PM By: [name] ?Entered By: [name] on 02/17/2022 15:31:29 ?-------------------------------------------------------------------------------- ?Encounter Discharge Information Details ?Patient Name: Date of Service: ?[name], [name] 02/17/2022 3:30 PM ?Medical Record Number: [id number] ?Patient Account Number: [id number] ?Date of Birth/Sex: Treating RN: ?Dec 27, 1955 (66 y.o. F) [name], [name] ?Primary Care Kimmerly Lora: [name] Other Clinician: ?Referring Suriah Peragine: ?Treating Ramonia Mcclaran/Extender: Fredirick Maudlin ?[name] ?Weeks in Treatment: 11 ?Encounter Discharge Information Items Post Procedure  Vitals ?Discharge Condition: Stable ?Temperature (F): 97.6 ?Ambulatory Status: Wheelchair ?Pulse (bpm): 64 ?Discharge Destination: Home ?Respiratory Rate (breaths/min): 18 ?Transportation: Private Auto ?Blood Pressure (mmHg): 114/64 ?Accompanied By: spouse ?Schedule Follow-up Appointment: Yes ?Clinical Summary of Care: Patient Declined ?Electronic Signature(s) ?Signed: 02/17/2022 6:05:16 PM By: Baruch Gouty RN, BSN ?Entered By: Baruch Gouty on 02/17/2022 16:16:20 ?-------------------------------------------------------------------------------- ?Lower Extremity Assessment Details ?Patient Name: ?Date of Service: ?[place], [name] 02/17/2022 3:30 PM ?Medical Record Number: [id number] ?Patient Account Number: [id number] ?Date of Birth/Sex: ?Treating RN: ?1956/07/13 (66 y.o. F) Deaton, Bobbi ?Primary Care Rayn Enderson: [name] ?Other Clinician: ?Referring Michala Deblanc: ?Treating Leshae Mcclay/Extender: Fredirick Maudlin ?[name] ?Weeks in Treatment: 11 ?Edema Assessment ?Assessed: [Left: No] [Right: No] ?Edema: [Left: Yes] [Right: Yes] ?Calf ?Left: Right: ?Point of Measurement: 27 cm From Medial Instep 36.5 cm 35.3 cm ?Ankle ?Left: Right: ?Point of Measurement: 9 cm From Medial Instep 22.2 cm 25.5 cm ?Vascular Assessment ?Pulses: ?Dorsalis Pedis ?Palpable: [Left:Yes] [Right:Yes] ?Electronic Signature(s) ?Signed: 02/17/2022 3:45:50 PM By: Deon Pilling RN, BSN ?Signed: 02/20/2022 2:08:13 PM By: [name] ?Entered By: [name] on 02/17/2022 15:35:47 ?-------------------------------------------------------------------------------- ?Multi Wound Chart Details ?Patient Name: ?Date of Service: ?[place], [name] 02/17/2022 3:30 PM ?Medical Record Number: [id number] ?Patient Account Number: [id number] ?Date of Birth/Sex: ?Treating RN: ?1956-05-02 (66 y.o. F) Deaton, Bobbi ?Primary Care Carynn Felling: [name] ?Other Clinician: ?Referring Girl Schissler: ?Treating Cordarrius Coad/Extender: Fredirick Maudlin ?[name] ?Weeks in Treatment: 11 ?Vital Signs ?Height(in): 61 ?Pulse(bpm): 64 ?Weight(lbs): 169 ?Blood Pressure(mmHg): 114/64 ?Body Mass Index(BMI): 31.9 ?Temperature(??F): 97.6 ?Respiratory Rate(breaths/min): 18 ?Photos: [11:Left Calcaneus] [12:No Photos Right, Plantar Foot] ?Wound Location: [11:Gradually Appeared] [12:Gradually Appeared] ?Wounding Event: [11:Diabetic Wound/Ulcer of the Lower Diabetic Wound/Ulcer of the Lower] ?Primary Etiology: [11:Extremity Cataracts, Anemia, Sleep Apnea,] [12:Extremity Cataracts, Anemia, Sleep Apnea,] ?Comorbid History: [11:Coronary Artery Disease, Hypertension, Myocardial Infarction, Type II Diabetes, Osteoarthritis, Type II Diabetes, Osteoarthritis, Osteomyelitis, Neuropathy 10/30/2020] [12:Coronary Artery Disease, Osteomyelitis, Neuropathy 09/29/2021] ?Date Acquired: [11:11] [12:11] ?Weeks of Treatment: [11:Open] [12:Open] ?Wound Status: [11:No] [12:No] ?Wound Recurrence: [  11:2x1.6x0.2] [12:0.2x0.2x0.2] ?Measurements L x W x D (cm) [11:2.513] [12:0.031] ?A (cm?) : ?rea [11:0.503] [12:0.006] ?Volume (cm?) : [11:77.30%] [12:98.20%] ?% Reduction in A [11:rea: 95.50%] [12:99.70%] ?% Reduction in Volume: [12:12] ?Starting Position 1 (o'clock): [12:12] ?Ending Position 1 (o'clock): [12:0.2] ?Maximum Distance 1 (cm): [11:No] [12:Yes] ?Undermining: [11:Grade 3] [12:Grade 3] ?Classification: [11:Medium] [12:None Present] ?Exudate A mount: [11:Serosanguineous] [12:N/A] ?Exudate Type: [11:red, brown] [12:N/A] ?Exudate Color: [11:Flat and Intact] [12:Well defined, not attached] ?Wound Margin: [11:Large (67-100%)] [12:Large (67-100%)] ?Granulation A mount: [11:Red] [12:Red] ?Granulation Quality: [11:Small (1-33%)] [12:None Present (0%)] ?Necrotic A mount: ?[11:Fat Layer (Subcutaneous Tissue): Yes Fat Layer (Subcutaneous Tissue): Yes N/A] ?Exposed Structures: ?[11:Fascia: No Tendon: No Muscle: No Joint: No Bone: No Small (1-33%)] [12:Fascia: No Tendon: No Muscle: No  Joint: No Bone: No None] ?Epithelialization: [11:N/A] [12:Debridement - Excisional] ?Debridement: ?Pre-procedure Verification/Time Out N/A [12:15:55] ?Taken: [11:N/A] [12:Other] ?Pain Control: [11:N/A] [12:Callus, Subcutaneous] ?Tissue Debrided: [11:N/A] [12:Skin/Subcutaneous Tissue] ?Level: [11:N/A] [12:0.25] ?Debridement A (sq cm): [11:rea N/A] [12:Curette] ?Instrument: [11:N/A] [12:Minimum] ?Bleeding: [11:N/A] [12:Pressure] ?Hemostasis A chieved: [11:N/A] [12:0] ?Procedural Pain: [11:N/A] [12:0] ?Post Procedural Pain: [11:N/A] [12:Procedure was tolerated well] ?Debridement Treatment Response: [11:N/A] [12:0.2x0.2x0.2] ?Post Debridement Measurements L x ?W x D (cm) [11:N/A] [12:0.006] ?Post Debridement Volume: (cm?) [11:N/A] [12:Debridement] ?Treatment Notes ?Wound #11 (Calcaneus) Wound Laterality: Left ?Cleanser ?Normal Saline ?Discharge Instruction: Cleanse the wound with Normal Saline prior to applying a clean dressing using gauze sponges, not tissue or cotton balls. ?Wound Cleanser ?Discharge Instruction: Cleanse the wound with wound cleanser prior to applying a clean dressing using gauze sponges, not tissue or cotton balls. ?Peri-Wound Care ?Zinc Oxide Ointment 30g tube ?Discharge Instruction: Apply Zinc Oxide to periwound with each dressing change ?Topical ?Primary Dressing ?KerraCel Ag Gelling Fiber Dressing, 4x5 in (silver alginate) ?Discharge Instruction: Apply silver alginate to wound bed as instructed ?Secondary Dressing ?ABD Pad, 5x9 ?Discharge Instruction: Apply over primary dressing as directed. ?Optifoam Non-Adhesive Dressing, 4x4 in ?Discharge Instruction: Apply over primary dressing to make foam donut ?Woven Gauze Sponge, Non-Sterile 4x4 in ?Discharge Instruction: Apply over primary dressing as directed. ?Secured With ?Kerlix Roll Sterile, 4.5x3.1 (in/yd) ?Discharge Instruction: Secure with Kerlix as directed. ?60M Medipore Soft Cloth Surgical T 2x10 (in/yd) ?ape ?Discharge Instruction: Secure with  tape as directed. ?Compression Wrap ?Compression Stockings ?Add-Ons ?Wound #12 (Foot) Wound Laterality: Plantar, Right ?Cleanser ?Normal Saline ?Discharge Instruction: Cleanse the wound with Normal Saline prior to applying a clean d

## 2022-02-21 NOTE — Progress Notes (Signed)
TIONDRA, FANG ([id number]) ?Visit Report for 02/17/2022 ?Chief Complaint Document Details ?Patient Name: Date of Service: ?[name] [name] RE, [name] 02/17/2022 3:30 PM ?Medical Record Number: [id number] ?Patient Account Number: [id number] ?Date of Birth/Sex: Treating RN: ?Oct 19, 1956 (66 y.o. F) Deaton, Bobbi ?Primary Care Provider: [name] Other Clinician: ?Referring Provider: ?Treating Provider/Extender: Fredirick Maudlin ?[name] ?Weeks in Treatment: 11 ?Information Obtained from: Patient ?Chief Complaint ?Left heel ulcer ?01/10/2021; patient returns to clinic with 2 large wounds on her bilateral heels ?11/29/2021; patient returns to clinic with substantial wounds on the right foot and left heel. ?Electronic Signature(s) ?Signed: 02/17/2022 4:42:02 PM By: Fredirick Maudlin MD FACS ?Entered By: Fredirick Maudlin on 02/17/2022 16:42:02 ?-------------------------------------------------------------------------------- ?Debridement Details ?Patient Name: Date of Service: ?[name] [name] RE, [name] 02/17/2022 3:30 PM ?Medical Record Number: [id number] ?Patient Account Number: [id number] ?Date of Birth/Sex: Treating RN: ?[birth year] (66 y.o. F) [name], [name] ?Primary Care Provider: [name] Other Clinician: ?Referring Provider: ?Treating Provider/Extender: Fredirick Maudlin ?[name] ?Weeks in Treatment: 11 ?Debridement Performed for Assessment: Wound #12 Right,Plantar Foot ?Performed By: Physician Fredirick Maudlin, MD ?Debridement Type: Debridement ?Severity of Tissue Pre Debridement: Fat layer exposed ?Level of Consciousness (Pre-procedure): Awake and Alert ?Pre-procedure Verification/Time Out Yes - 15:55 ?Taken: ?Start Time: 15:58 ?Pain Control: ?Other : benzocaine 20% spray ?T Area Debrided (L x W): ?otal 0.5 (cm) x 0.5 (cm) = 0.25 (cm?) ?Tissue and other material debrided: ?Viable, Non-Viable, Callus, Subcutaneous, Skin: Epidermis ?Level: Skin/Subcutaneous Tissue ?Debridement Description:  Excisional ?Instrument: Curette ?Bleeding: Minimum ?Hemostasis Achieved: Pressure ?Procedural Pain: 0 ?Post Procedural Pain: 0 ?Response to Treatment: Procedure was tolerated well ?Level of Consciousness (Post- Awake and Alert ?procedure): ?Post Debridement Measurements of Total Wound ?Length: (cm) 0.2 ?Width: (cm) 0.2 ?Depth: (cm) 0.2 ?Volume: (cm?) 0.006 ?Character of Wound/Ulcer Post Debridement: Improved ?Severity of Tissue Post Debridement: Fat layer exposed ?Post Procedure Diagnosis ?Same as Pre-procedure ?Electronic Signature(s) ?Signed: 02/17/2022 4:58:30 PM By: Fredirick Maudlin MD FACS ?Signed: 02/17/2022 6:05:16 PM By: Baruch Gouty RN, BSN ?Entered By: Baruch Gouty on 02/17/2022 15:58:40 ?-------------------------------------------------------------------------------- ?HPI Details ?Patient Name: Date of Service: ?[name] [name] RE, [name] 02/17/2022 3:30 PM ?Medical Record Number: [id number] ?Patient Account Number: [id number] ?Date of Birth/Sex: Treating RN: ?1956/01/08 (66 y.o. F) Deaton, Bobbi ?Primary Care Provider: [name] Other Clinician: ?Referring Provider: ?Treating Provider/Extender: Fredirick Maudlin ?[name] ?Weeks in Treatment: 11 ?History of Present Illness ?HPI Description: ADMISSION ?11/12/2018 ?This is a 66 year old woman with type 2 diabetes and diabetic neuropathy. She has been dealing with a left heel plantar wound for roughly 2 years. She states ?this started when she pulled some skin off the area and it progressed into a wound. She also has an area on the tip of her left great toe for 1 year. She has ?been largely followed by Dr. Sharol Given and she had an excision of bone in the left heel in 2017. I do not see microbiology from this excision or pathology. ?Apparently this wound never really closed. She most recently has been using Silvadene cream. Offloading this with a scooter. The last MRI I see was in June ?2018 which did not show osteomyelitis at that time. Apparently  this wound is never really progressed towards healing. During her last review by Dr. Sharol Given in ?October it was recommended that she undergo a left BKA and she refused. She went to see a second orthopedic consult at [facility] who ?am ?recommended conservative wound care to see if this will close or progress towards closure but  also warned that possible surgery may be necessary. An x-ray ?that was done at [facility] showed an irregular calcaneal body and calcaneal tuberosity. This is probably postprocedural. Previous x-rays at [facility] had suggested ?heterotrophic calcifications but I do not see this. Apparently a second ointment was added to the Silvadene which the patient thinks is because some ?improvement. She has not been systemically unwell. No fever or chills. She thinks the second ointment that was given to her at [facility] has helped somewhat. 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 ?ABIs done in June 2018 showed a ABI of 1.12 on the right and 1.1 on the left she was biphasic bilaterally. ABIs in our clinic were noncompressible today. ?11/20/18 on evaluation today patient is seen for her second visit here in the office although this is actually the first visit with me concerning an issue that she's ?having with her great toe and heel of the left foot. Fortunately she does not appear to be have any discomfort at this time and she does have a scooter in ?order to offload her foot she also has a boot for offloading. With that being said this is something that appears to have been going on for some time she was ?seeing Dr. due to his recommendation was that she was going to require a below knee amputation. With that  being said she wanted to come to the wound center ?but according to the patient Dr. Sharol Given told her that "we could not help her". Nonetheless she saw another provider who suggested that it may be worth a shot for ?[name] to try and help her out if it all possible. Nonetheless she decided that it would be worth a trial since otherwise any the way she's gonna end up with an ?amputation. Obviously if we can heal the wound then that will not be the case. Again I explained to her that obviously there are no guarantees but we will give ?this a good try and attempts to get the wound to heal. ?1/30; the patient continues to have areas on the left plantar heel and the left plantar great toe. The more worrisome area is the heel. She went for MRI on ?Saturday but this could not be done because she had silver alginate in the wound bed. I would like to get this rebooked. If she does not have osteomyelitis she ?will need a total contact cast. We have been using silver alginate in the wounds ?2/7; patient has wounds on her left plantar heel and left plantar great toe. The area on the heel has some depth although it looks about the same today. Her ?MRI will finally be done tomorrow. If she has osteomyelitis in the heel and then we will need to consider her for IV antibiotics and hyperbaric oxygen. If the ?MRI is negative she will need a total contact cast although she is wearing a cam boot and using a scooter at present. Will be using silver alginate. She will take ?it off tomorrow in preparation for the MRI ?2/14; the patient's MRI showed extensive surgical changes with a large portion of her calcaneus removed on the left secondary to her previous surgery by Dr. ?Sharol Given however there is no evidence of  osteomyelitis. She would therefore be a candidate for a total contact cast and we applied this for the first time today ?2/21; silver collagen total contact cast. The area on the plantar left great toe is "healed" still a lot of callus on  this area. Dimensions on the plantar heel not too ?much different some epithelialization is present however. This is an improvement ?12/25/18 on evaluation today patient actually is seen for follow-up concerning i

## 2022-02-21 NOTE — Telephone Encounter (Addendum)
Per Dr Megan Salon patient OK to have Central Cath removed ASAP. ? ?[name] [name] [facility]  has scheduled patient for 11 AM 02/27/22. Patient must arrive at 10:45 AM.  ? ?Patient aware and requested this date and time. No Prep required for this IR appt.  ? ?Patient Abx end date was 01/24/22. Line remained in pace for [name] [name]  use and removal order was never placed. ? ?Attached [facility] Triage and Sesser ?

## 2022-02-21 NOTE — Chronic Care Management (AMB) (Signed)
?Chronic Care Management  ? ? Clinical Social Work Note ? ?02/21/2022 ?Name: [name] [name] MRN: [id number] DOB: 07/15/1956 ? ?[name] [name] is a 66 y.o. year old adult who is a primary care patient of Ann Held, DO. The CCM team was consulted to assist the patient with chronic disease management and/or care coordination needs related to: [employer] and [employer].  ? ?Engaged with patient by telephone for follow up visit in response to provider referral for social work chronic care management and care coordination services.  ? ?Consent to Services:  ?The patient was given information about Chronic Care Management services, agreed to services, and gave verbal consent prior to initiation of services.  Please see initial visit note for detailed documentation.  ? ?Patient agreed to services and consent obtained.  ? ?Assessment: Review of patient past medical history, allergies, medications, and health status, including review of relevant consultants reports was performed today as part of a comprehensive evaluation and provision of chronic care management and care coordination services.    ? ?SDOH (Social Determinants of Health) assessments and interventions performed:   ? ?Advanced Directives Status: Not addressed in this encounter. ? ?CCM Care Plan ? ?Allergies  ?Allergen Reactions  ? Bee Pollen Anaphylaxis  ? Fish-Derived Products Hives, Shortness Of Breath, Swelling and Rash  ?  Hives get in throat causing trouble breathing  ? Mushroom Extract Complex Anaphylaxis  ? Penicillins Anaphylaxis  ?  **Tolerated cefepime March 2021 ?Did it involve swelling of the face/tongue/throat, SOB, or low BP? Yes ?Did it involve sudden or severe rash/hives, skin peeling, or any reaction on the inside of your mouth or nose? No ?Did you need to seek medical attention at a hospital or doctor's office? Yes ?When did it last happen?  A few months ago ?If all above answers are "NO", may  proceed with cephalosporin use.  ? Rosemary Oil Anaphylaxis  ? Shellfish Allergy Hives, Shortness Of Breath, Swelling and Rash  ? Tomato Hives and Shortness Of Breath  ?  Hives in throat causes her trouble breathing  ? Acetaminophen Other (See Comments)  ?  GI upset  ? Aloe Vera Hives  ? Broccoli [Brassica Oleracea] Hives  ? Acyclovir And Related Other (See Comments)  ?  Unknown reaction  ? Naproxen Other (See Comments)  ?  Unknown reaction  ? ? ?Outpatient Encounter Medications as of 02/21/2022  ?Medication Sig Note  ? acetaminophen (TYLENOL) 325 MG tablet Take 2 tablets (650 mg total) by mouth every 6 (six) hours as needed for mild pain, fever or headache (or Fever >/= 101). Discontinue 500/1000 mg dosing   ? albuterol (PROVENTIL) (2.5 MG/3ML) 0.083% nebulizer solution Take 3 mLs (2.5 mg total) by nebulization every 6 (six) hours as needed for wheezing or shortness of breath.   ? albuterol (VENTOLIN HFA) 108 (90 Base) MCG/ACT inhaler Inhale 2 puffs into the lungs every 6 (six) hours as needed for wheezing or shortness of breath.   ? apixaban (ELIQUIS) 2.5 MG TABS tablet Take 1 tablet (2.5 mg total) by mouth 2 (two) times daily.   ? atorvastatin (LIPITOR) 10 MG tablet Take 1 tablet (10 mg total) by mouth daily.   ? carvedilol (COREG) 6.25 MG tablet Take 1 tablet (6.25 mg total) by mouth 2 (two) times daily with a meal.   ? Continuous Blood Gluc Receiver (FREESTYLE LIBRE 14 DAY READER) DEVI 1 Device by Does not apply route daily.   ? Continuous Blood  Gluc Sensor (FREESTYLE LIBRE 14 DAY SENSOR) MISC 1 Device by Does not apply route daily.   ? diazepam (VALIUM) 5 MG tablet Take 1 tablet (5 mg total) by mouth every 12 (twelve) hours as needed for anxiety.   ? DULoxetine (CYMBALTA) 60 MG capsule Take 1 capsule (60 mg total) by mouth daily.   ? ferrous sulfate 325 (65 FE) MG tablet Take 1 tablet (325 mg total) by mouth 2 (two) times daily with a meal.   ? fluticasone-salmeterol (ADVAIR HFA) 115-21 MCG/ACT inhaler Inhale 2  puffs into the lungs 2 (two) times daily.   ? folic acid (FOLVITE) 1 MG tablet Take 2 tablets by mouth once daily (Patient taking differently: Take 2 mg by mouth daily.) 01/08/2022: Pt's husband reported that the pt has been out of this medication for roughly a week, and is in need of more refills.  ? furosemide (LASIX) 20 MG tablet Take 2 tablets (40 mg total) by mouth daily.   ? HYDROcodone-acetaminophen (NORCO/VICODIN) 5-325 MG tablet Take 1 tablet by mouth every 6 (six) hours as needed for moderate pain.   ? insulin glargine (LANTUS SOLOSTAR) 100 UNIT/ML Solostar Pen INJECT 20 UNITS SUBCUTANEOUSLY IN THE MORNING AND 40 AT NIGHT (Patient taking differently: 15-18 Units at bedtime.)   ? montelukast (SINGULAIR) 10 MG tablet Take 1 tablet (10 mg total) by mouth at bedtime.   ? Multiple Vitamin (MULTIVITAMIN WITH MINERALS) TABS tablet Take 1 tablet by mouth daily.   ? NONFORMULARY OR COMPOUNDED ITEM Power wheelchair  #1  as directed   ? ondansetron (ZOFRAN) 8 MG tablet Take 0.5 tablets (4 mg total) by mouth every 8 (eight) hours as needed for nausea or vomiting. (Patient taking differently: Take 4-8 mg by mouth every 8 (eight) hours as needed for nausea or vomiting.) 01/08/2022: Spouse reported that the pt is all out of this medication and in need of refills.  ? pregabalin (LYRICA) 75 MG capsule Take 1 capsule (75 mg total) by mouth 2 (two) times daily.   ? VITAMIN D PO Take 125 mcg by mouth daily.   ? [DISCONTINUED] metFORMIN (GLUCOPHAGE) 1000 MG tablet Take 1,000 mg by mouth 2 (two) times daily with a meal.     ? [DISCONTINUED] omeprazole (PRILOSEC) 20 MG capsule Take 20 mg by mouth daily.     ? ?No facility-administered encounter medications on file as of 02/21/2022.  ? ? ?Patient Active Problem List  ? Diagnosis Date Noted  ? CKD (chronic kidney disease) stage 5, GFR less than 15 ml/min (HCC) 02/14/2022  ? Acute CHF (congestive heart failure) ([place]) 01/07/2022  ? Acute on chronic diastolic CHF (congestive heart  failure) ([place]) 01/07/2022  ? Prolonged QT interval 01/07/2022  ? Cellulitis 01/07/2022  ? Streptococcal bacteremia   ? Severe sepsis ([place])   ? 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 Cellulitis of right foot 10/16/2021  ? Hyponatremia 10/16/2021  ? Hyperkalemia 10/16/2021  ? Acute kidney injury superimposed on CKD ([place]) 10/16/2021  ? Leukocytosis 10/16/2021  ? Diabetic foot ulcers ([place]) 10/16/2021  ? DM (diabetes mellitus), type 2 with renal complications ([place]) 51/70/0174  ? Generalized weakness 10/16/2021  ? Portal hypertension ([place]) 08/16/2021  ? 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[place]) 08/16/2021  ? Eczema of scalp 06/27/2021  ? Hypothyroidism 06/27/2021  ? Pruritus 06/27/2021  ? Seasonal allergic rhinitis due to pollen 02/02/2021  ? CRF (chronic renal failure), stage 4 (severe) ([place]) 02/02/2021  ? PTSD (post-traumatic stress disorder)   ?  Osteoarthritis   ? MRSA (methicillin resistant Staphylococcus aureus)   ? Hypertension   ? High cholesterol   ? Hepatic steatosis   ? GERD (gastroesophageal reflux disease)   ? Fibromyalgia   ? Fatty liver   ? Exertional shortness of breath   ? Episode of visual loss of left eye   ? Depression   ? Dehiscence of closure of skin   ? Constipation   ? Chronic kidney disease   ? Cataract   ? Bulging lumbar disc   ? Asthma   ? Anxiety   ? Anginal pain ([place])   ? Anemia   ? Tinea capitis 10/11/2020  ? Tinea corporis 10/11/2020  ? SOB (shortness of breath) 10/11/2020  ? Hip pain 05/16/2020  ? IDA (iron deficiency anemia) 05/04/2020  ? 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 S/P lumbar fusion 03/04/2020  ? Spinal stenosis of lumbar region 01/22/2020  ? Osteomyelitis of vertebra of thoracolumbar region [facility]) 01/21/2020  ? Compression fracture of L1 lumbar vertebra ([place]) 11/17/2019  ? Fracture of multiple ribs 11/17/2019  ? Diarrhea 09/04/2019  ? Pressure injury of skin 04/17/2019  ? Septic arthritis of elbow, right ([place])  04/17/2019  ? Retinopathy 04/17/2019  ? 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 Sleep apnea 11/16/2017  ? Diabetic foot infection ([place]) 04/06/2017  ? Type 2 diabetes mellitus with hyperglycemia, with long

## 2022-02-21 NOTE — Patient Instructions (Addendum)
Visit Information ? ?Thank you for taking time to visit with me today. Please don't hesitate to contact me if I can be of assistance to you before our next scheduled telephone appointment. ? ?Following are the goals we discussed today:  ?Patient Goals/Self-Care Activities: ?Attend evaluation with physical therapist at [facility], on 02/25/2022 at 12:30 pm, to obtain a motorized wheelchair. ?Contact LCSW directly (# [phone]), if you have questions, need assistance, or if additional social work needs are identified in the near future.   ?No Follow-Up Required. ? ?Please call the care guide team at [phone] if you need to cancel or reschedule your appointment.  ? ?If you are experiencing a Mental Health or [place] or need someone to talk to, please call the Suicide and Crisis Lifeline: 988 ?call the [place] National Suicide Prevention Lifeline: 848 165 8854 or TTY: (262) 498-4956 TTY 760-169-4525) to talk to a trained counselor ?call 1-800-273-TALK (toll free, 24 hour hotline) ?go to [facility] [address], [place] [phone]) ?call the [facility]: [phone] ?call 911  ? ?Patient verbalizes understanding of instructions and care plan provided today and agrees to view in [place]. Active MyChart status confirmed with patient.   ? ?Nat Christen LCSW ?Licensed Clinical Social Worker ?[place] ?[phone]  ?

## 2022-02-21 NOTE — Progress Notes (Signed)
Virtual Visit via Telephone Note ? ?I connected with [name] [name] on 02/21/22 at  3:45 PM EDT by telephone and verified that I am speaking with the correct person using two identifiers. ? ?Location: ?Patient: Home ?Provider: [facility] ?  ?I discussed the limitations, risks, security and privacy concerns of performing an evaluation and management service by telephone and the availability of in person appointments. I also discussed with the patient that there may be a patient responsible charge related to this service. The patient expressed understanding and agreed to proceed. ? ? ?History of Present Illness: ?I called and spoke with [name] [name] today.  She was having problems tolerating her oral antibiotic therapy that she had been on for her chronic bilateral foot infections and I instructed her to stop her antibiotics on 01/24/2022.  She is feeling much better.  She is noted decreased swelling in her feet and legs.  She saw Dr. Celine Ahr at the wound center on 02/11/2019 for who noted that all of her foot wounds had improved. ? ?She has not required a transfusion since she was hospitalized several months ago.  Her last 2 hemoglobin measurements were 9.1.  She was told at the cancer center that they were comfortable having her central line removed but no order was ever placed. ?  ?Observations/Objective: ?Lab Results  ?Component Value Date  ? WBC 6.1 02/14/2022  ? HGB 9.1 (L) 02/14/2022  ? HCT 28.4 (L) 02/14/2022  ? MCV 90.8 02/14/2022  ? PLT 139.0 (L) 02/14/2022  ?  ? ?Assessment and Plan: ?I am hopeful that her bilateral foot infections have been cured by her recent wound care and prolonged course of antibiotics.  She will remain off of antibiotics and follow-up here as needed. ? ?Her chronic anemia has improved and she has not been requiring frequent red blood cell transfusions recently.  I will place an order to have her central line removed. ? ?Follow Up Instructions: ?Follow-up here as needed ?Order central line  removal ?  ?I discussed the assessment and treatment plan with the patient. The patient was provided an opportunity to ask questions and all were answered. The patient agreed with the plan and demonstrated an understanding of the instructions. ?  ?The patient was advised to call back or seek an in-person evaluation if the symptoms worsen or if the condition fails to improve as anticipated. ? ?I provided 17 minutes of non-face-to-face time during this encounter. ? ? ?Michel Bickers, MD  ?

## 2022-02-22 NOTE — Telephone Encounter (Signed)
Patient returned missed call. Relayed appointment information to have central cath removed. ?Does not have any questions at this time.  ?Leatrice Jewels, RMA  ?

## 2022-02-23 ENCOUNTER — Ambulatory Visit (HOSPITAL_COMMUNITY): Payer: PRIVATE HEALTH INSURANCE

## 2022-02-26 DIAGNOSIS — Z794 Long term (current) use of insulin: Secondary | ICD-10-CM

## 2022-02-26 DIAGNOSIS — N186 End stage renal disease: Secondary | ICD-10-CM

## 2022-02-26 DIAGNOSIS — F32A Depression, unspecified: Secondary | ICD-10-CM

## 2022-02-26 DIAGNOSIS — Z992 Dependence on renal dialysis: Secondary | ICD-10-CM

## 2022-02-26 DIAGNOSIS — I252 Old myocardial infarction: Secondary | ICD-10-CM

## 2022-02-26 DIAGNOSIS — E785 Hyperlipidemia, unspecified: Secondary | ICD-10-CM

## 2022-02-26 DIAGNOSIS — E1165 Type 2 diabetes mellitus with hyperglycemia: Secondary | ICD-10-CM

## 2022-02-26 DIAGNOSIS — I2510 Atherosclerotic heart disease of native coronary artery without angina pectoris: Secondary | ICD-10-CM

## 2022-02-26 DIAGNOSIS — N189 Chronic kidney disease, unspecified: Secondary | ICD-10-CM

## 2022-02-26 DIAGNOSIS — N179 Acute kidney failure, unspecified: Secondary | ICD-10-CM

## 2022-02-26 DIAGNOSIS — F418 Other specified anxiety disorders: Secondary | ICD-10-CM

## 2022-02-26 DIAGNOSIS — I10 Essential (primary) hypertension: Secondary | ICD-10-CM | POA: Diagnosis not present

## 2022-02-26 DIAGNOSIS — E1169 Type 2 diabetes mellitus with other specified complication: Secondary | ICD-10-CM

## 2022-02-26 DIAGNOSIS — J4540 Moderate persistent asthma, uncomplicated: Secondary | ICD-10-CM

## 2022-02-27 ENCOUNTER — Ambulatory Visit (HOSPITAL_COMMUNITY): Admission: RE | Admit: 2022-02-27 | Payer: PRIVATE HEALTH INSURANCE | Source: Ambulatory Visit
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
## 2022-02-28 ENCOUNTER — Encounter: Admit: 2022-02-28 | Discharge: 2022-02-28 | Payer: MEDICARE

## 2022-02-28 ENCOUNTER — Ambulatory Visit: Admit: 2022-02-28 | Discharge: 2022-03-01 | Payer: MEDICARE

## 2022-02-28 DIAGNOSIS — E1129 Type 2 diabetes mellitus with other diabetic kidney complication: Secondary | ICD-10-CM

## 2022-02-28 DIAGNOSIS — D509 Iron deficiency anemia, unspecified: Secondary | ICD-10-CM

## 2022-02-28 DIAGNOSIS — E785 Hyperlipidemia, unspecified: Secondary | ICD-10-CM

## 2022-02-28 DIAGNOSIS — E559 Vitamin D deficiency, unspecified: Secondary | ICD-10-CM

## 2022-02-28 DIAGNOSIS — Z0000 Encounter for general adult medical examination without abnormal findings: Secondary | ICD-10-CM

## 2022-02-28 DIAGNOSIS — J45909 Unspecified asthma, uncomplicated: Secondary | ICD-10-CM

## 2022-02-28 DIAGNOSIS — J309 Allergic rhinitis, unspecified: Secondary | ICD-10-CM

## 2022-02-28 DIAGNOSIS — E119 Type 2 diabetes mellitus without complications: Secondary | ICD-10-CM

## 2022-02-28 DIAGNOSIS — T466X5A Adverse effect of antihyperlipidemic and antiarteriosclerotic drugs, initial encounter: Secondary | ICD-10-CM

## 2022-02-28 DIAGNOSIS — D649 Anemia, unspecified: Secondary | ICD-10-CM

## 2022-02-28 DIAGNOSIS — K589 Irritable bowel syndrome without diarrhea: Secondary | ICD-10-CM

## 2022-02-28 DIAGNOSIS — M79642 Pain in left hand: Secondary | ICD-10-CM

## 2022-02-28 DIAGNOSIS — I10 Essential (primary) hypertension: Secondary | ICD-10-CM

## 2022-02-28 DIAGNOSIS — D125 Benign neoplasm of sigmoid colon: Secondary | ICD-10-CM

## 2022-02-28 DIAGNOSIS — G9050 Complex regional pain syndrome I, unspecified: Secondary | ICD-10-CM

## 2022-02-28 DIAGNOSIS — R5383 Other fatigue: Secondary | ICD-10-CM

## 2022-02-28 DIAGNOSIS — G5600 Carpal tunnel syndrome, unspecified upper limb: Secondary | ICD-10-CM

## 2022-02-28 DIAGNOSIS — F22 Delusional disorders: Secondary | ICD-10-CM

## 2022-02-28 DIAGNOSIS — K2970 Gastritis, unspecified, without bleeding: Secondary | ICD-10-CM

## 2022-02-28 DIAGNOSIS — N1832 Stage 3b chronic kidney disease (HCC): Secondary | ICD-10-CM

## 2022-02-28 DIAGNOSIS — K5900 Constipation, unspecified: Secondary | ICD-10-CM

## 2022-02-28 LAB — CBC AND DIFF
ABSOLUTE BASO COUNT: 0 K/UL (ref 0–0.20)
ABSOLUTE EOS COUNT: 0.2 K/UL (ref 0–0.45)
ABSOLUTE MONO COUNT: 0.6 K/UL (ref 0–0.80)
EOSINOPHILS %: 3 % (ref 0–5)
HEMOGLOBIN: 11 g/dL — ABNORMAL LOW (ref <200)
MPV: 8.8 FL (ref 7–11)
PLATELET COUNT: 261 K/UL — ABNORMAL HIGH (ref 150–400)
WBC COUNT: 6.5 K/UL (ref 4.5–11.0)

## 2022-02-28 LAB — COMPREHENSIVE METABOLIC PANEL
ALBUMIN: 4.4 g/dL (ref 3.5–5.0)
ALK PHOSPHATASE: 88 U/L (ref 25–110)
ALT: 14 U/L (ref 7–56)
ANION GAP: 8 K/UL (ref 3–12)
AST: 16 U/L (ref 7–40)
EGFR: 28 mL/min — ABNORMAL LOW (ref >60)
GLUCOSE,PANEL: 151 mg/dL — ABNORMAL HIGH (ref >40)
SODIUM: 137 MMOL/L (ref 137–147)

## 2022-02-28 LAB — LIPID PROFILE
LDL: 120 mg/dL — ABNORMAL HIGH (ref <100)
TRIGLYCERIDES: 76 mg/dL — ABNORMAL LOW (ref <150)

## 2022-02-28 LAB — MICROALB/CR RATIO-URINE RANDOM: MICROALBUMIN/CR RATIO URINE: 54 ug/mg — ABNORMAL HIGH (ref <30)

## 2022-02-28 LAB — 25-OH VITAMIN D (D2 + D3): VITAMIN D (25-OH) TOTAL: 31 ng/mL — ABNORMAL HIGH (ref 30–80)

## 2022-02-28 MED ORDER — PIOGLITAZONE 30 MG PO TAB
30 mg | ORAL_TABLET | Freq: Every day | ORAL | 3 refills | 90.00000 days | Status: AC
Start: 2022-02-28 — End: ?

## 2022-02-28 MED ORDER — CARVEDILOL 25 MG PO TAB
25 mg | ORAL_TABLET | Freq: Two times a day (BID) | ORAL | 3 refills | 90.00000 days | Status: AC
Start: 2022-02-28 — End: ?

## 2022-03-01 ENCOUNTER — Inpatient Hospital Stay: Payer: PRIVATE HEALTH INSURANCE | Attending: Hematology & Oncology

## 2022-03-01 ENCOUNTER — Inpatient Hospital Stay: Payer: PRIVATE HEALTH INSURANCE

## 2022-03-01 ENCOUNTER — Encounter: Admit: 2022-03-01 | Discharge: 2022-03-01 | Payer: MEDICARE

## 2022-03-01 DIAGNOSIS — Z79899 Other long term (current) drug therapy: Secondary | ICD-10-CM | POA: Insufficient documentation

## 2022-03-01 DIAGNOSIS — Z86718 Personal history of other venous thrombosis and embolism: Secondary | ICD-10-CM | POA: Insufficient documentation

## 2022-03-01 DIAGNOSIS — N183 Chronic kidney disease, stage 3 unspecified: Secondary | ICD-10-CM | POA: Insufficient documentation

## 2022-03-01 DIAGNOSIS — D631 Anemia in chronic kidney disease: Secondary | ICD-10-CM | POA: Insufficient documentation

## 2022-03-01 DIAGNOSIS — G629 Polyneuropathy, unspecified: Secondary | ICD-10-CM | POA: Insufficient documentation

## 2022-03-01 DIAGNOSIS — Z7901 Long term (current) use of anticoagulants: Secondary | ICD-10-CM | POA: Insufficient documentation

## 2022-03-01 DIAGNOSIS — E538 Deficiency of other specified B group vitamins: Secondary | ICD-10-CM

## 2022-03-01 DIAGNOSIS — E7800 Pure hypercholesterolemia, unspecified: Secondary | ICD-10-CM

## 2022-03-01 DIAGNOSIS — R809 Proteinuria, unspecified: Secondary | ICD-10-CM

## 2022-03-01 MED ORDER — GLIMEPIRIDE 2 MG PO TAB
3 mg | ORAL_TABLET | Freq: Every morning | ORAL | 1 refills | Status: AC
Start: 2022-03-01 — End: ?

## 2022-03-01 MED ORDER — EZETIMIBE 10 MG PO TAB
10 mg | ORAL_TABLET | Freq: Every day | ORAL | 3 refills | Status: AC
Start: 2022-03-01 — End: ?

## 2022-03-03 ENCOUNTER — Encounter (HOSPITAL_BASED_OUTPATIENT_CLINIC_OR_DEPARTMENT_OTHER): Payer: PRIVATE HEALTH INSURANCE | Admitting: General Surgery

## 2022-03-04 ENCOUNTER — Encounter: Admit: 2022-03-04 | Discharge: 2022-03-04 | Payer: MEDICARE

## 2022-03-04 MED ORDER — PANTOPRAZOLE 40 MG PO TBEC
ORAL_TABLET | 1 refills
Start: 2022-03-04 — End: ?

## 2022-03-06 ENCOUNTER — Telehealth: Payer: Self-pay

## 2022-03-06 DIAGNOSIS — M8610 Other acute osteomyelitis, unspecified site: Secondary | ICD-10-CM

## 2022-03-06 DIAGNOSIS — G894 Chronic pain syndrome: Secondary | ICD-10-CM

## 2022-03-06 DIAGNOSIS — Z981 Arthrodesis status: Secondary | ICD-10-CM

## 2022-03-06 MED ORDER — HYDROCODONE-ACETAMINOPHEN 5-325 MG PO TABS: 1.0000 | ORAL_TABLET | Freq: Four times a day (QID) | ORAL | 0 refills | Status: DC | PRN

## 2022-03-06 NOTE — Telephone Encounter (Signed)
Pt called in needed HYDROcodone-acetaminophen (NORCO/VICODIN) 5-325 MG tablet sent into pharmacy. ?

## 2022-03-09 ENCOUNTER — Telehealth: Payer: Self-pay | Admitting: Family Medicine

## 2022-03-09 ENCOUNTER — Other Ambulatory Visit: Payer: Self-pay | Admitting: Family Medicine

## 2022-03-09 DIAGNOSIS — R110 Nausea: Secondary | ICD-10-CM

## 2022-03-09 MED ORDER — ONDANSETRON HCL 8 MG PO TABS: 4.0000 mg | ORAL_TABLET | Freq: Three times a day (TID) | ORAL | 0 refills | Status: DC | PRN

## 2022-03-09 NOTE — Telephone Encounter (Signed)
Pt received medication from hospital. Okay to refill? ?

## 2022-03-09 NOTE — Telephone Encounter (Signed)
Medication: ondansetron (ZOFRAN) 8 MG tablet  ? ?Has the patient contacted their pharmacy? Yes.   ?Pt states that a hospital ist gave her the medication, and would like [facility] to refill it.  ? ?Preferred Pharmacy (with phone number or street name):  ?[place], [place]  ?[address], [place] [address]  ?Phone:  [phone]  Fax:  [phone]  ?DEA #:  -- ? ?Agent: Please be advised that RX refills may take up to 3 business days. We ask that you follow-up with your pharmacy.  ?

## 2022-03-10 ENCOUNTER — Encounter (HOSPITAL_BASED_OUTPATIENT_CLINIC_OR_DEPARTMENT_OTHER): Payer: PRIVATE HEALTH INSURANCE | Attending: General Surgery | Admitting: General Surgery

## 2022-03-10 DIAGNOSIS — M86672 Other chronic osteomyelitis, left ankle and foot: Secondary | ICD-10-CM | POA: Insufficient documentation

## 2022-03-10 DIAGNOSIS — L97524 Non-pressure chronic ulcer of other part of left foot with necrosis of bone: Secondary | ICD-10-CM | POA: Insufficient documentation

## 2022-03-10 DIAGNOSIS — E11621 Type 2 diabetes mellitus with foot ulcer: Secondary | ICD-10-CM | POA: Insufficient documentation

## 2022-03-10 DIAGNOSIS — L97514 Non-pressure chronic ulcer of other part of right foot with necrosis of bone: Secondary | ICD-10-CM | POA: Insufficient documentation

## 2022-03-10 DIAGNOSIS — E1122 Type 2 diabetes mellitus with diabetic chronic kidney disease: Secondary | ICD-10-CM | POA: Insufficient documentation

## 2022-03-10 DIAGNOSIS — I130 Hypertensive heart and chronic kidney disease with heart failure and stage 1 through stage 4 chronic kidney disease, or unspecified chronic kidney disease: Secondary | ICD-10-CM | POA: Insufficient documentation

## 2022-03-10 DIAGNOSIS — N184 Chronic kidney disease, stage 4 (severe): Secondary | ICD-10-CM | POA: Insufficient documentation

## 2022-03-10 DIAGNOSIS — E1142 Type 2 diabetes mellitus with diabetic polyneuropathy: Secondary | ICD-10-CM | POA: Diagnosis not present

## 2022-03-10 DIAGNOSIS — M86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, right ankle and foot: Secondary | ICD-10-CM | POA: Insufficient documentation

## 2022-03-10 NOTE — Progress Notes (Signed)
ARLINA, SABINA ([id number]) ?Visit Report for 03/10/2022 ?Arrival Information Details ?Patient Name: Date of Service: ?[place], [name] 03/10/2022 3:15 PM ?Medical Record Number: [id number] ?Patient Account Number: [id number] ?Date of Birth/Sex: Treating RN: ?Dec 13, 1955 (66 y.o. F) [place], Mechele Claude ?Primary Care Alfa Leibensperger: [name] Other Clinician: ?Referring Jamez Ambrocio: ?Treating Shanaya Schneck/Extender: Fredirick Maudlin ?[name] ?Weeks in Treatment: 14 ?Visit Information History Since Last Visit ?Added or deleted any medications: No ?Patient Arrived: Wheel Chair ?Any new allergies or adverse reactions: No ?Arrival Time: 15:31 ?Had a fall or experienced change in No ?Accompanied By: spouse ?activities of daily living that may affect ?Transfer Assistance: Manual ?risk of falls: ?Patient Requires Transmission-Based Precautions: No ?Signs or symptoms of abuse/neglect since last visito No ?Patient Has Alerts: No ?Hospitalized since last visit: No ?Implantable device outside of the clinic excluding No ?cellular tissue based products placed in the center ?since last visit: ?Has Dressing in Place as Prescribed: Yes ?Pain Present Now: Yes ?Electronic Signature(s) ?Signed: 03/10/2022 6:18:46 PM By: Dellie Catholic RN ?Entered By: Dellie Catholic on 03/10/2022 15:32:23 ?-------------------------------------------------------------------------------- ?Encounter Discharge Information Details ?Patient Name: Date of Service: ?[place], [name] 03/10/2022 3:15 PM ?Medical Record Number: [id number] ?Patient Account Number: [id number] ?Date of Birth/Sex: Treating RN: ?03/04/1956 (66 y.o. F) [place]ton, Mechele Claude ?Primary Care Urie Loughner: [name] Other Clinician: ?Referring Antoninette Lerner: ?Treating Kourtnei Rauber/Extender: Fredirick Maudlin ?[name] ?Weeks in Treatment: 14 ?Encounter Discharge Information Items Post Procedure Vitals ?Discharge Condition: Stable ?Temperature (F): 97.4 ?Ambulatory Status:  Wheelchair ?Pulse (bpm): 56 ?Discharge Destination: Home ?Respiratory Rate (breaths/min): 18 ?Transportation: Private Auto ?Blood Pressure (mmHg): 109/65 ?Accompanied By: spouse ?Schedule Follow-up Appointment: Yes ?Clinical Summary of Care: Patient Declined ?Electronic Signature(s) ?Signed: 03/10/2022 6:18:46 PM By: Dellie Catholic RN ?Entered By: Dellie Catholic on 03/10/2022 17:13:58 ?-------------------------------------------------------------------------------- ?Lower Extremity Assessment Details ?Patient Name: ?Date of Service: ?[place], [name] 03/10/2022 3:15 PM ?Medical Record Number: [id number] ?Patient Account Number: [id number] ?Date of Birth/Sex: ?Treating RN: ?12/07/1955 (66 y.o. F) [place]ton, Mechele Claude ?Primary Care Rayman Petrosian: [name] ?Other Clinician: ?Referring Dejanee Thibeaux: ?Treating Tamiya Colello/Extender: Fredirick Maudlin ?[name] ?Weeks in Treatment: 14 ?Edema Assessment ?Assessed: [Left: No] [Right: No] ?Edema: [Left: Yes] [Right: Yes] ?Calf ?Left: Right: ?Point of Measurement: 27 cm From Medial Instep 36.5 cm 35.3 cm ?Ankle ?Left: Right: ?Point of Measurement: 9 cm From Medial Instep 22.2 cm 25.5 cm ?Electronic Signature(s) ?Signed: 03/10/2022 6:18:46 PM By: Dellie Catholic RN ?Entered By: Dellie Catholic on 03/10/2022 15:43:34 ?-------------------------------------------------------------------------------- ?Multi Wound Chart Details ?Patient Name: ?Date of Service: ?[place], [name] 03/10/2022 3:15 PM ?Medical Record Number: [id number] ?Patient Account Number: [id number] ?Date of Birth/Sex: ?Treating RN: ?[birth year] (66 y.o. F) [place]ton, Mechele Claude ?Primary Care Merritt Mccravy: [name] ?Other Clinician: ?Referring Grissel Tyrell: ?Treating Candence Sease/Extender: Fredirick Maudlin ?[name] ?Weeks in Treatment: 14 ?Vital Signs ?Height(in): 61 ?Pulse(bpm): 56 ?Weight(lbs): 169 ?Blood Pressure(mmHg): 109/65 ?Body Mass Index(BMI): 31.9 ?Temperature(??F): 97.4 ?Respiratory  Rate(breaths/min): 18 ?Photos: [N/A:N/A] ?Left Calcaneus Right, Plantar Foot N/A ?Wound Location: ?Gradually Appeared Gradually Appeared N/A ?Wounding Event: ?Diabetic Wound/Ulcer of the Lower Diabetic Wound/Ulcer of the Lower N/A ?Primary Etiology: ?Extremity Extremity ?Cataracts, Anemia, Sleep Apnea, Cataracts, Anemia, Sleep Apnea, N/A ?Comorbid History: ?Coronary Artery Disease, Coronary Artery Disease, ?Hypertension, Myocardial Infarction, Hypertension, Myocardial Infarction, ?Type II Diabetes, Osteoarthritis, Type II Diabetes, Osteoarthritis, ?Osteomyelitis, Neuropathy Osteomyelitis, Neuropathy ?10/30/2020 09/29/2021 N/A ?Date Acquired: ?33 14 N/A ?Weeks of Treatment: ?Open Healed - Epithelialized N/A ?Wound Status: ?No No N/A ?Wound Recurrence: ?1.8x1.2x0.3 0x0x0 N/A ?Measurements L x W x D (cm) ?1.696 0  N/A ?A (cm?) : ?rea ?0.509 0 N/A ?Volume (cm?) : ?84.70% 100.00% N/A ?% Reduction in A rea: ?95.40% 100.00% N/A ?% Reduction in Volume: ?Grade 3 Grade 3 N/A ?Classification: ?Medium None Present N/A ?Exudate A mount: ?Serosanguineous N/A N/A ?Exudate Type: ?red, brown N/A N/A ?Exudate Color: ?Flat and Intact N/A N/A ?Wound Margin: ?Small (1-33%) None Present (0%) N/A ?Granulation A mount: ?Red N/A N/A ?Granulation Quality: ?Large (67-100%) None Present (0%) N/A ?Necrotic A mount: ?Fat Layer (Subcutaneous Tissue): Yes Fascia: No N/A ?Exposed Structures: ?Fascia: No ?Fat Layer (Subcutaneous Tissue): No ?Tendon: No ?Tendon: No ?Muscle: No ?Muscle: No ?Joint: No ?Joint: No ?Bone: No ?Bone: No ?Small (1-33%) Large (67-100%) N/A ?Epithelialization: ?Debridement - Selective/Open Wound Debridement - Selective/Open Wound N/A ?Debridement: ?Pre-procedure Verification/Time Out 15:58 15:58 N/A ?Taken: ?Other Other N/A ?Pain Control: ?Necrotic/Eschar, Callus Necrotic/Eschar, Callus N/A ?Tissue Debrided: ?Non-Viable Tissue Non-Viable Tissue N/A ?Level: ?2.16 0.04 N/A ?Debridement A (sq cm): ?rea ?Curette Curette  N/A ?Instrument: ?Minimum Minimum N/A ?Bleeding: ?Pressure Pressure N/A ?Hemostasis A chieved: ?0 0 N/A ?Procedural Pain: ?0 0 N/A ?Post Procedural Pain: ?Procedure was tolerated well Procedure was tolerated well N/A ?Debridement Treatment Response: ?1.8x1.2x0.3 0.2x0.2x0.1 N/A ?Post Debridement Measurements L x ?W x D (cm) ?0.509 0.003 N/A ?Post Debridement Volume: (cm?) ?Debridement Debridement N/A ?Procedures Performed: ?Treatment Notes ?Electronic Signature(s) ?Signed: 03/10/2022 4:07:06 PM By: Fredirick Maudlin MD FACS ?Signed: 03/10/2022 6:18:46 PM By: Dellie Catholic RN ?Entered By: Fredirick Maudlin on 03/10/2022 16:07:06 ?-------------------------------------------------------------------------------- ?Multi-Disciplinary Care Plan Details ?Patient Name: ?Date of Service: ?[place], [name] 03/10/2022 3:15 PM ?Medical Record Number: [id number] ?Patient Account Number: [id number] ?Date of Birth/Sex: ?Treating RN: ?[birth year] (66 y.o. F) [place]ton, Mechele Claude ?Primary Care Michel Eskelson: [name] ?Other Clinician: ?Referring Cabria Micalizzi: ?Treating Bartt Gonzaga/Extender: Fredirick Maudlin ?[name] ?Weeks in Treatment: 14 ?Multidisciplinary Care Plan reviewed with physician ?Active Inactive ?Nutrition ?Nursing Diagnoses: ?Impaired glucose control: actual or potential ?Potential for alteratiion in Nutrition/Potential for imbalanced nutrition ?Goals: ?Patient/caregiver agrees to and verbalizes understanding of need to use nutritional supplements and/or vitamins as prescribed ?Date Initiated: 11/29/2021 ?Date Inactivated: 01/03/2022 ?Target Resolution Date: 12/30/2021 ?Goal Status: Met ?Patient/caregiver will maintain therapeutic glucose control ?Date Initiated: 11/29/2021 ?Target Resolution Date: 04/28/2022 ?Goal Status: Active ?Interventions: ?Assess HgA1c results as ordered upon admission and as needed ?Assess patient nutrition upon admission and as needed per policy ?Provide education on elevated blood sugars and impact  on wound healing ?Provide education on nutrition ?Treatment Activities: ?Education provided on Nutrition : 11/29/2021 ?Notes: ?Wound/Skin Impairment ?Nursing Diagnoses: ?Impaired tissue integrity ?Knowledge deficit related to ulceration/compromis

## 2022-03-10 NOTE — Progress Notes (Signed)
[name] [name], [name] [name] ([id number]) ?Visit Report for 03/10/2022 ?Chief Complaint Document Details ?Patient Name: Date of Service: ?[place], [name] 03/10/2022 3:15 PM ?Medical Record Number: [id number] ?Patient Account Number: [id number] ?Date of Birth/Sex: Treating RN: ?1956/07/08 (66 y.o. F) Scotton, Mechele Claude ?Primary Care Provider: [name] Other Clinician: ?Referring Provider: ?Treating Provider/Extender: Fredirick Maudlin ?[name] ?Weeks in Treatment: 14 ?Information Obtained from: Patient ?Chief Complaint ?Left heel ulcer ?01/10/2021; patient returns to clinic with 2 large wounds on her bilateral heels ?11/29/2021; patient returns to clinic with substantial wounds on the right foot and left heel. ?Electronic Signature(s) ?Signed: 03/10/2022 4:08:33 PM By: Fredirick Maudlin MD FACS ?Entered By: Fredirick Maudlin on 03/10/2022 16:08:33 ?-------------------------------------------------------------------------------- ?Debridement Details ?Patient Name: Date of Service: ?[place], [name] 03/10/2022 3:15 PM ?Medical Record Number: [id number] ?Patient Account Number: [id number] ?Date of Birth/Sex: Treating RN: ?19-Dec-1955 (66 y.o. F) Scotton, Mechele Claude ?Primary Care Provider: [name] Other Clinician: ?Referring Provider: ?Treating Provider/Extender: Fredirick Maudlin ?[name] ?Weeks in Treatment: 14 ?Debridement Performed for Assessment: Wound #12 Right,Plantar Foot ?Performed By: Physician Fredirick Maudlin, MD ?Debridement Type: Debridement ?Severity of Tissue Pre Debridement: Fat layer exposed ?Level of Consciousness (Pre-procedure): Awake and Alert ?Pre-procedure Verification/Time Out Yes - 15:58 ?Taken: ?Start Time: 15:58 ?Pain Control: ?Other : benzocaine 20% ?T Area Debrided (L x W): ?otal 0.2 (cm) x 0.2 (cm) = 0.04 (cm?) ?Tissue and other material debrided: Non-Viable, Callus, Eschar ?Level: Non-Viable Tissue ?Debridement Description: Selective/Open Wound ?Instrument:  Curette ?Bleeding: Minimum ?Hemostasis Achieved: Pressure ?End Time: 15:59 ?Procedural Pain: 0 ?Post Procedural Pain: 0 ?Response to Treatment: Procedure was tolerated well ?Level of Consciousness (Post- Awake and Alert ?procedure): ?Post Debridement Measurements of Total Wound ?Length: (cm) 0.2 ?Width: (cm) 0.2 ?Depth: (cm) 0.1 ?Volume: (cm?) 0.003 ?Character of Wound/Ulcer Post Debridement: Improved ?Severity of Tissue Post Debridement: Fat layer exposed ?Post Procedure Diagnosis ?Same as Pre-procedure ?Electronic Signature(s) ?Signed: 03/10/2022 4:14:20 PM By: Fredirick Maudlin MD FACS ?Signed: 03/10/2022 6:18:46 PM By: Dellie Catholic RN ?Entered By: Dellie Catholic on 03/10/2022 16:01:46 ?-------------------------------------------------------------------------------- ?Debridement Details ?Patient Name: ?Date of Service: ?[name], [name] 03/10/2022 3:15 PM ?Medical Record Number: [id number] ?Patient Account Number: [id number] ?Date of Birth/Sex: ?Treating RN: ?11/08/1955 (66 y.o. F) [place]ton, Mechele Claude ?Primary Care Provider: [name] ?Other Clinician: ?Referring Provider: ?Treating Provider/Extender: Fredirick Maudlin ?[name] ?Weeks in Treatment: 14 ?Debridement Performed for Assessment: Wound #11 Left Calcaneus ?Performed By: Physician Fredirick Maudlin, MD ?Debridement Type: Debridement ?Severity of Tissue Pre Debridement: Fat layer exposed ?Level of Consciousness (Pre-procedure): Awake and Alert ?Pre-procedure Verification/Time Out Yes - 15:58 ?Taken: ?Start Time: 15:58 ?Pain Control: ?Other : benzocaine 20% ?T Area Debrided (L x W): ?otal 1.8 (cm) x 1.2 (cm) = 2.16 (cm?) ?Tissue and other material debrided: Non-Viable, Callus, Eschar ?Level: Non-Viable Tissue ?Debridement Description: Selective/Open Wound ?Instrument: Curette ?Bleeding: Minimum ?Hemostasis Achieved: Pressure ?End Time: 15:59 ?Procedural Pain: 0 ?Post Procedural Pain: 0 ?Response to Treatment: Procedure was tolerated  well ?Level of Consciousness (Post- Awake and Alert ?procedure): ?Post Debridement Measurements of Total Wound ?Length: (cm) 1.8 ?Width: (cm) 1.2 ?Depth: (cm) 0.3 ?Volume: (cm?) 0.509 ?Character of Wound/Ulcer Post Debridement: Improved ?Severity of Tissue Post Debridement: Fat layer exposed ?Post Procedure Diagnosis ?Same as Pre-procedure ?Electronic Signature(s) ?Signed: 03/10/2022 4:14:20 PM By: Fredirick Maudlin MD FACS ?Signed: 03/10/2022 6:18:46 PM By: Dellie Catholic RN ?Entered By: Dellie Catholic on 03/10/2022 16:03:29 ?-------------------------------------------------------------------------------- ?HPI Details ?Patient Name: Date of Service: ?[name] [name], [name] 03/10/2022 3:15 PM ?Medical Record Number: [id number] ?  Patient Account Number: [id number] ?Date of Birth/Sex: Treating RN: ?[birth year] (66 y.o. F) Scotton, Mechele Claude ?Primary Care Provider: [name] Other Clinician: ?Referring Provider: ?Treating Provider/Extender: Fredirick Maudlin ?[name] ?Weeks in Treatment: 14 ?History of Present Illness ?HPI Description: ADMISSION ?11/12/2018 ?This is a 66 year old woman with type 2 diabetes and diabetic neuropathy. She has been dealing with a left heel plantar wound for roughly 2 years. She states ?this started when she pulled some skin off the area and it progressed into a wound. She also has an area on the tip of her left great toe for 1 year. She has ?been largely followed by Dr. Sharol Given and she had an excision of bone in the left heel in 2017. I do not see microbiology from this excision or pathology. ?Apparently this wound never really closed. She most recently has been using Silvadene cream. Offloading this with a scooter. The last MRI I see was in June ?2018 which did not show osteomyelitis at that time. Apparently this wound is never really progressed towards healing. During her last review by Dr. Sharol Given in ?October it was recommended that she undergo a left BKA and she refused. She went to  see a second orthopedic consult at [facility] who ?am ?recommended conservative wound care to see if this will close or progress towards closure but also warned that possible surgery may be necessary. An x-ray ?that was done at [facility] showed an irregular calcaneal body and calcaneal tuberosity. This is probably postprocedural. Previous x-rays at [facility] had suggested ?heterotrophic calcifications but I do not see this. Apparently a second ointment was added to the Silvadene which the patient thinks is because some ?improvement. She has not been systemically unwell. No fever or chills. She thinks the second ointment that was given to her at [facility] has helped somewhat. 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 ?ABIs done in June 2018 showed a ABI of 1.12 on the right and 1.1 on the left she was biphasic bilaterally. ABIs in our clinic were noncompressible today. ?11/20/18 on evaluation today patient is seen for her second visit here in the office although this is actually the first visit with me concerning an issue that she's ?having with her great toe and heel of the left foot. Fortunately she does not appear to be have any discomfort at this time and she does have a scooter in ?order to offload her foot she also has a boot for offloading. With that being said this is something that appears to have been going on for some time she was ?seeing Dr. due to his recommendation was that she was going to require a below knee amputation. With that being said she wanted to come to the wound center ?but according to the patient Dr. Sharol Given told her that "we could not help her". Nonetheless she saw another provider who suggested  that it may be worth a shot for ?[name] to try and help her out if it all possible. Nonetheless she decided that it would be worth a trial since otherwise any the way she's gonna end up with an ?amputation. Obviously if we can hea

## 2022-03-10 NOTE — Telephone Encounter (Signed)
Noted  

## 2022-03-13 ENCOUNTER — Other Ambulatory Visit: Payer: Self-pay | Admitting: Family Medicine

## 2022-03-13 DIAGNOSIS — G894 Chronic pain syndrome: Secondary | ICD-10-CM

## 2022-03-15 NOTE — Telephone Encounter (Signed)
Requesting: Lyrica 79m ?Contract:02/14/22 ?UDS: 02/14/22 ?Last Visit: 02/14/22 ?Next Visit: None ?Last Refill:01/09/22 #60 and 0RF ? ?Please Advise ? ?

## 2022-03-22 ENCOUNTER — Inpatient Hospital Stay: Payer: PRIVATE HEALTH INSURANCE

## 2022-03-22 ENCOUNTER — Encounter: Payer: Self-pay | Admitting: Family

## 2022-03-22 ENCOUNTER — Other Ambulatory Visit: Payer: Self-pay

## 2022-03-22 ENCOUNTER — Inpatient Hospital Stay (HOSPITAL_BASED_OUTPATIENT_CLINIC_OR_DEPARTMENT_OTHER): Payer: PRIVATE HEALTH INSURANCE | Admitting: Family

## 2022-03-22 ENCOUNTER — Encounter: Admit: 2022-03-22 | Discharge: 2022-03-22 | Payer: MEDICARE

## 2022-03-22 ENCOUNTER — Ambulatory Visit: Admit: 2022-03-22 | Discharge: 2022-03-23 | Payer: MEDICARE

## 2022-03-22 VITALS — BP 152/62 | HR 73 | Temp 97.9°F | Resp 18 | Ht 61.0 in

## 2022-03-22 VITALS — BP 110/61 | HR 66

## 2022-03-22 VITALS — BP 130/69 | HR 64 | Ht 65.5 in | Wt 171.0 lb

## 2022-03-22 DIAGNOSIS — D500 Iron deficiency anemia secondary to blood loss (chronic): Secondary | ICD-10-CM

## 2022-03-22 DIAGNOSIS — N183 Chronic kidney disease, stage 3 unspecified: Secondary | ICD-10-CM

## 2022-03-22 DIAGNOSIS — Z7901 Long term (current) use of anticoagulants: Secondary | ICD-10-CM | POA: Diagnosis not present

## 2022-03-22 DIAGNOSIS — D631 Anemia in chronic kidney disease: Secondary | ICD-10-CM

## 2022-03-22 DIAGNOSIS — G629 Polyneuropathy, unspecified: Secondary | ICD-10-CM | POA: Diagnosis not present

## 2022-03-22 DIAGNOSIS — Z86718 Personal history of other venous thrombosis and embolism: Secondary | ICD-10-CM | POA: Diagnosis not present

## 2022-03-22 DIAGNOSIS — Z79899 Other long term (current) drug therapy: Secondary | ICD-10-CM | POA: Diagnosis not present

## 2022-03-22 DIAGNOSIS — D508 Other iron deficiency anemias: Secondary | ICD-10-CM

## 2022-03-22 DIAGNOSIS — K5900 Constipation, unspecified: Secondary | ICD-10-CM

## 2022-03-22 DIAGNOSIS — E785 Hyperlipidemia, unspecified: Secondary | ICD-10-CM

## 2022-03-22 DIAGNOSIS — M79642 Pain in left hand: Secondary | ICD-10-CM

## 2022-03-22 DIAGNOSIS — E119 Type 2 diabetes mellitus without complications: Secondary | ICD-10-CM

## 2022-03-22 DIAGNOSIS — K589 Irritable bowel syndrome without diarrhea: Secondary | ICD-10-CM

## 2022-03-22 DIAGNOSIS — G9050 Complex regional pain syndrome I, unspecified: Secondary | ICD-10-CM

## 2022-03-22 DIAGNOSIS — E559 Vitamin D deficiency, unspecified: Secondary | ICD-10-CM

## 2022-03-22 DIAGNOSIS — J309 Allergic rhinitis, unspecified: Secondary | ICD-10-CM

## 2022-03-22 DIAGNOSIS — I10 Essential (primary) hypertension: Secondary | ICD-10-CM

## 2022-03-22 DIAGNOSIS — J45909 Unspecified asthma, uncomplicated: Secondary | ICD-10-CM

## 2022-03-22 DIAGNOSIS — D649 Anemia, unspecified: Secondary | ICD-10-CM

## 2022-03-22 DIAGNOSIS — K2970 Gastritis, unspecified, without bleeding: Secondary | ICD-10-CM

## 2022-03-22 DIAGNOSIS — G5600 Carpal tunnel syndrome, unspecified upper limb: Secondary | ICD-10-CM

## 2022-03-22 LAB — CMP (CANCER CENTER ONLY)
ALT: 8 U/L (ref 0–44)
AST: 12 U/L — ABNORMAL LOW (ref 15–41)
Albumin: 3.7 g/dL (ref 3.5–5.0)
Alkaline Phosphatase: 92 U/L (ref 38–126)
Anion gap: 8 (ref 5–15)
BUN: 63 mg/dL — ABNORMAL HIGH (ref 8–23)
CO2: 20 mmol/L — ABNORMAL LOW (ref 22–32)
Calcium: 9.4 mg/dL (ref 8.9–10.3)
Chloride: 108 mmol/L (ref 98–111)
Creatinine: 3.88 mg/dL (ref 0.44–1.00)
GFR, Estimated: 12 mL/min — ABNORMAL LOW (ref >60)
Glucose, Bld: 130 mg/dL — ABNORMAL HIGH (ref 70–99)
Potassium: 5.3 mmol/L — ABNORMAL HIGH (ref 3.5–5.1)
Sodium: 136 mmol/L (ref 135–145)
Total Bilirubin: 0.4 mg/dL (ref 0.3–1.2)
Total Protein: 8.4 g/dL — ABNORMAL HIGH (ref 6.5–8.1)

## 2022-03-22 LAB — FERRITIN: Ferritin: 372 ng/mL — ABNORMAL HIGH (ref 11–307)

## 2022-03-22 LAB — CBC WITH DIFFERENTIAL (CANCER CENTER ONLY)
Abs Immature Granulocytes: 0.02 10*3/uL (ref 0.00–0.07)
Basophils Absolute: 0.1 10*3/uL (ref 0.0–0.1)
Basophils Relative: 1 %
Eosinophils Absolute: 0.4 10*3/uL (ref 0.0–0.5)
Eosinophils Relative: 5 %
HCT: 32.2 % — ABNORMAL LOW (ref 36.0–46.0)
Hemoglobin: 10 g/dL — ABNORMAL LOW (ref 12.0–15.0)
Immature Granulocytes: 0 %
Lymphocytes Relative: 8 %
Lymphs Abs: 0.6 10*3/uL — ABNORMAL LOW (ref 0.7–4.0)
MCH: 27.7 pg (ref 26.0–34.0)
MCHC: 31.1 g/dL (ref 30.0–36.0)
MCV: 89.2 fL (ref 80.0–100.0)
Monocytes Absolute: 0.6 10*3/uL (ref 0.1–1.0)
Monocytes Relative: 7 %
Neutro Abs: 6 10*3/uL (ref 1.7–7.7)
Neutrophils Relative %: 79 %
Platelet Count: 174 10*3/uL (ref 150–400)
RBC: 3.61 MIL/uL — ABNORMAL LOW (ref 3.87–5.11)
RDW: 15.3 % (ref 11.5–15.5)
WBC Count: 7.6 10*3/uL (ref 4.0–10.5)
nRBC: 0 % (ref 0.0–0.2)

## 2022-03-22 LAB — SAMPLE TO BLOOD BANK

## 2022-03-22 LAB — RETICULOCYTES
Immature Retic Fract: 7.7 % (ref 2.3–15.9)
RBC.: 3.58 MIL/uL — ABNORMAL LOW (ref 3.87–5.11)
Retic Count, Absolute: 55.8 10*3/uL (ref 19.0–186.0)
Retic Ct Pct: 1.6 % (ref 0.4–3.1)

## 2022-03-22 MED ORDER — EPOETIN ALFA-EPBX 40000 UNIT/ML IJ SOLN
40000.0000 [IU] | Freq: Once | INTRAMUSCULAR | Status: AC
  Administered 2022-03-22: 40000 [IU] via SUBCUTANEOUS
  Filled 2022-03-22: qty 1

## 2022-03-22 NOTE — Patient Instructions (Signed)
Epoetin Alfa injection ?What is this medication? ?EPOETIN ALFA (e POE e tin AL fa) helps your body make more red blood cells. This medicine is used to treat anemia caused by chronic kidney disease, cancer chemotherapy, or HIV-therapy. It may also be used before surgery if you have anemia. ?This medicine may be used for other purposes; ask your health care provider or pharmacist if you have questions. ?COMMON BRAND NAME(S): Epogen, Procrit, Retacrit ?What should I tell my care team before I take this medication? ?They need to know if you have any of these conditions: ?cancer ?heart disease ?high blood pressure ?history of blood clots ?history of stroke ?low levels of folate, iron, or vitamin B12 in the blood ?seizures ?an unusual or allergic reaction to erythropoietin, albumin, benzyl alcohol, hamster proteins, other medicines, foods, dyes, or preservatives ?pregnant or trying to get pregnant ?breast-feeding ?How should I use this medication? ?This medicine is for injection into a vein or under the skin. It is usually given by a health care professional in a hospital or clinic setting. ?If you get this medicine at home, you will be taught how to prepare and give this medicine. Use exactly as directed. Take your medicine at regular intervals. Do not take your medicine more often than directed. ?It is important that you put your used needles and syringes in a special sharps container. Do not put them in a trash can. If you do not have a sharps container, call your pharmacist or healthcare provider to get one. ?A special MedGuide will be given to you by the pharmacist with each prescription and refill. Be sure to read this information carefully each time. ?Talk to your pediatrician regarding the use of this medicine in children. While this drug may be prescribed for selected conditions, precautions do apply. ?Overdosage: If you think you have taken too much of this medicine contact a poison control center or emergency  room at once. ?NOTE: This medicine is only for you. Do not share this medicine with others. ?What if I miss a dose? ?If you miss a dose, take it as soon as you can. If it is almost time for your next dose, take only that dose. Do not take double or extra doses. ?What may interact with this medication? ?Interactions have not been studied. ?This list may not describe all possible interactions. Give your health care provider a list of all the medicines, herbs, non-prescription drugs, or dietary supplements you use. Also tell them if you smoke, drink alcohol, or use illegal drugs. Some items may interact with your medicine. ?What should I watch for while using this medication? ?Your condition will be monitored carefully while you are receiving this medicine. ?You may need blood work done while you are taking this medicine. ?This medicine may cause a decrease in vitamin B6. You should make sure that you get enough vitamin B6 while you are taking this medicine. Discuss the foods you eat and the vitamins you take with your health care professional. ?What side effects may I notice from receiving this medication? ?Side effects that you should report to your doctor or health care professional as soon as possible: ?allergic reactions like skin rash, itching or hives, swelling of the face, lips, or tongue ?seizures ?signs and symptoms of a blood clot such as breathing problems; changes in vision; chest pain; severe, sudden headache; pain, swelling, warmth in the leg; trouble speaking; sudden numbness or weakness of the face, arm or leg ?signs and symptoms of a stroke like   changes in vision; confusion; trouble speaking or understanding; severe headaches; sudden numbness or weakness of the face, arm or leg; trouble walking; dizziness; loss of balance or coordination ?Side effects that usually do not require medical attention (report to your doctor or health care professional if they continue or are  bothersome): ?chills ?cough ?dizziness ?fever ?headaches ?joint pain ?muscle cramps ?muscle pain ?nausea, vomiting ?pain, redness, or irritation at site where injected ?This list may not describe all possible side effects. Call your doctor for medical advice about side effects. You may report side effects to FDA at 1-800-FDA-1088. ?Where should I keep my medication? ?Keep out of the reach of children. ?Store in a refrigerator between 2 and 8 degrees C (36 and 46 degrees F). Do not freeze or shake. Throw away any unused portion if using a single-dose vial. Multi-dose vials can be kept in the refrigerator for up to 21 days after the initial dose. Throw away unused medicine. ?NOTE: This sheet is a summary. It may not cover all possible information. If you have questions about this medicine, talk to your doctor, pharmacist, or health care provider. ?? 2023 Elsevier/Gold Standard (2017-06-19 00:00:00) ? ?
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
## 2022-03-22 NOTE — Progress Notes (Signed)
Hematology and Oncology Follow Up Visit  [name] [name] [id number] 10/06/1956 66 y.o. 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 She is doing well but notes chronic fatigue.  She states that her left foot has healed nicely and her left foot is much improved. She continues to get IV antibiotics with home health and wound care dressing changes.  No fever, chills, n/v, cough, rash, dizziness, chest pain, palpitations, abdominal pain or changes in bowel or bladder habits.  No blood loss, bruising or petechiae noted.  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  No falls or syncope to report. She does not ambulate much at home. She uses her motorized scooter.  Appetite and hydration are good. Unable to stand for weight.   ECOG Performance Status: 2 - Symptomatic, <50% confined to bed  Medications:  Allergies as of 03/22/2022       Reactions   Bee Pollen Anaphylaxis   Fish-derived Products Hives, Shortness Of Breath, Swelling, Rash   Hives get in throat causing trouble breathing   Mushroom Extract Complex Anaphylaxis   Penicillins Anaphylaxis   **Tolerated cefepime March 2021 Did it involve swelling of the face/tongue/throat, SOB, or low BP? Yes Did it involve sudden or severe rash/hives, skin peeling, or any reaction on the inside of your mouth or nose? No Did you need to seek medical attention at a hospital or doctor's office? Yes When did it last happen?  A few months ago If all above answers are "NO", may proceed with cephalosporin use.   Rosemary Oil Anaphylaxis   Shellfish Allergy Hives, Shortness  Of Breath, Swelling, Rash   Tomato Hives, Shortness Of Breath   Hives in throat causes her trouble breathing   Acetaminophen Other (See Comments)   GI upset   Aloe Vera Hives   Broccoli [brassica Oleracea] Hives   Acyclovir And Related Other (See Comments)   Unknown reaction   Naproxen Other (See Comments)   Unknown reaction        Medication List        Accurate as of Mar 22, 2022  1:16 PM. If you have any questions, ask your nurse or doctor.          acetaminophen 325 MG tablet Commonly known as: TYLENOL Take 2 tablets (650 mg total) by mouth every 6 (six) hours as needed for mild pain, fever or headache (or Fever >/= 101). Discontinue 500/1000 mg dosing   Advair HFA 115-21 MCG/ACT inhaler Generic drug: fluticasone-salmeterol Inhale 2 puffs into the lungs 2 (two) times daily.   albuterol (2.5 MG/3ML) 0.083% nebulizer solution Commonly known as: PROVENTIL Take 3 mLs (2.5 mg total) by nebulization every 6 (six) hours as needed for wheezing or shortness of breath.   albuterol 108 (90 Base) MCG/ACT inhaler Commonly known as: VENTOLIN HFA Inhale 2 puffs into the lungs every 6 (six) hours as needed for wheezing or shortness of breath.   apixaban 2.5 MG Tabs tablet Commonly known as: Eliquis Take 1 tablet (2.5 mg total) by mouth 2 (two) times daily.   atorvastatin 10 MG tablet Commonly  known as: LIPITOR Take 1 tablet (10 mg total) by mouth daily.   carvedilol 6.25 MG tablet Commonly known as: COREG Take 1 tablet (6.25 mg total) by mouth 2 (two) times daily with a meal.   diazepam 5 MG tablet Commonly known as: VALIUM Take 1 tablet (5 mg total) by mouth every 12 (twelve) hours as needed for anxiety.   DULoxetine 60 MG capsule Commonly known as: CYMBALTA Take 1 capsule (60 mg total) by mouth daily.   ferrous sulfate 325 (65 FE) MG tablet Take 1 tablet (325 mg total) by mouth 2 (two) times daily with a meal.   folic acid 1 MG tablet Commonly known as:  FOLVITE Take 2 tablets by mouth once daily   FreeStyle Libre 14 Day Reader Kerrin Mo 1 Device by Does not apply route daily.   FreeStyle Libre 14 Day Sensor Misc 1 Device by Does not apply route daily.   furosemide 20 MG tablet Commonly known as: LASIX Take 2 tablets (40 mg total) by mouth daily.   HYDROcodone-acetaminophen 5-325 MG tablet Commonly known as: NORCO/VICODIN Take 1 tablet by mouth every 6 (six) hours as needed for moderate pain.   Lantus SoloStar 100 UNIT/ML Solostar Pen Generic drug: insulin glargine INJECT 20 UNITS SUBCUTANEOUSLY IN THE MORNING AND 40 AT NIGHT What changed:  how much to take when to take this additional instructions   montelukast 10 MG tablet Commonly known as: SINGULAIR Take 1 tablet (10 mg total) by mouth at bedtime.   multivitamin with minerals Tabs tablet Take 1 tablet by mouth daily.   NONFORMULARY OR COMPOUNDED ITEM Power wheelchair  #1  as directed   ondansetron 8 MG tablet Commonly known as: Zofran Take 0.5 tablets (4 mg total) by mouth every 8 (eight) hours as needed for nausea or vomiting.   pregabalin 75 MG capsule Commonly known as: LYRICA TAKE 1 CAPSULE BY MOUTH THREE TIMES DAILY   VITAMIN D PO Take 125 mcg by mouth daily.        Allergies:  Allergies  Allergen Reactions   Bee Pollen Anaphylaxis   Fish-Derived Products Hives, Shortness Of Breath, Swelling and Rash    Hives get in throat causing trouble breathing   Mushroom Extract Complex Anaphylaxis   Penicillins Anaphylaxis    **Tolerated cefepime March 2021 Did it involve swelling of the face/tongue/throat, SOB, or low BP? Yes Did it involve sudden or severe rash/hives, skin peeling, or any reaction on the inside of your mouth or nose? No Did you need to seek medical attention at a hospital or doctor's office? Yes When did it last happen?  A few months ago If all above answers are "NO", may proceed with cephalosporin use.   Rosemary Oil Anaphylaxis    Shellfish Allergy Hives, Shortness Of Breath, Swelling and Rash   Tomato Hives and Shortness Of Breath    Hives in throat causes her trouble breathing   Acetaminophen Other (See Comments)    GI upset   Aloe Vera Hives   Broccoli [Brassica Oleracea] Hives   Acyclovir And Related Other (See Comments)    Unknown reaction   Naproxen Other (See Comments)    Unknown reaction    Past Medical History, Surgical history, Social history, and Family History were reviewed and updated.  Review of Systems: All other 10 point review of systems is negative.   Physical Exam:  vitals were not taken for this visit.   Wt Readings from Last 3 Encounters:  02/14/22 168 lb (76.2 kg)  01/06/22 169 lb (76.7 kg)  12/19/21 206 lb 11.2 oz (93.8 kg)    Ocular: Sclerae unicteric, pupils equal, round and reactive to light Ear-nose-throat: Oropharynx clear, dentition fair Lymphatic: No cervical or supraclavicular adenopathy Lungs no rales or rhonchi, good excursion bilaterally Heart regular rate and rhythm, no murmur appreciated Abd soft, nontender, positive bowel sounds MSK no focal spinal tenderness, no joint edema Neuro: non-focal, well-oriented, appropriate affect Breasts: Deferred   Lab Results  Component Value Date   WBC 7.6 03/22/2022   HGB 10.0 (L) 03/22/2022   HCT 32.2 (L) 03/22/2022   MCV 89.2 03/22/2022   PLT 174 03/22/2022   Lab Results  Component Value Date   FERRITIN 448 (H) 02/08/2022   IRON 76 02/08/2022   TIBC 255 02/08/2022   UIBC 179 02/08/2022   IRONPCTSAT 30 02/08/2022   Lab Results  Component Value Date   RETICCTPCT 1.6 03/22/2022   RBC 3.58 (L) 03/22/2022   No results found for: KPAFRELGTCHN, LAMBDASER, KAPLAMBRATIO No results found for: IGGSERUM, IGA, IGMSERUM No results found for: Ronnald Ramp, A1GS, A2GS, Violet Baldy, MSPIKE, SPEI   Chemistry      Component Value Date/Time   NA 137 02/14/2022 1248   K 5.6 (H) 02/14/2022 1248   CL 109  02/14/2022 1248   CO2 22 02/14/2022 1248   BUN 59 (H) 02/14/2022 1248   CREATININE 3.37 (H) 02/14/2022 1248   CREATININE 3.16 (HH) 02/08/2022 1247   CREATININE 1.97 (H) 06/24/2020 1431      Component Value Date/Time   CALCIUM 8.6 02/14/2022 1248   ALKPHOS 76 02/14/2022 1248   AST 14 02/14/2022 1248   AST 14 (L) 02/08/2022 1247   ALT 8 02/14/2022 1248   ALT 7 02/08/2022 1247   BILITOT 0.6 02/14/2022 1248   BILITOT 0.6 02/08/2022 1247       Impression and Plan: Ms. Bollig is a very pleasant 66 yo caucasian female with long standing history of iron deficiency anemia as well as history of DVT's in both the right and left peroneal and posterior tibial veins while she was bed bound in the hospital being treated for osteomyelitis.  She had an elevated homocystine level, mildly elevated anticardiolipin antibody IgG level and elevated D-dimer as well with lab work.  ESA given, Hgb 10.0.  Iron studies pending.  Lab check and injection every 3 weeks, follow-up in 9 weeks.   Lottie Dawson, NP 5/24/20231:16 PM

## 2022-03-23 ENCOUNTER — Other Ambulatory Visit: Payer: Self-pay | Admitting: Family Medicine

## 2022-03-23 DIAGNOSIS — G894 Chronic pain syndrome: Secondary | ICD-10-CM

## 2022-03-23 DIAGNOSIS — N1832 Chronic kidney disease, stage 3b (HCC): Principal | ICD-10-CM

## 2022-03-23 LAB — IRON AND IRON BINDING CAPACITY (CC-WL,HP ONLY)
Iron: 41 ug/dL (ref 28–170)
Saturation Ratios: 17 % (ref 10.4–31.8)
TIBC: 249 ug/dL — ABNORMAL LOW (ref 250–450)
UIBC: 208 ug/dL (ref 148–442)

## 2022-03-23 MED ORDER — PREGABALIN 75 MG PO CAPS: 75.0000 mg | ORAL_CAPSULE | Freq: Three times a day (TID) | ORAL | 0 refills | Status: DC

## 2022-03-23 NOTE — Telephone Encounter (Signed)
Requesting: [facility] Contract: 02/14/2022 UDS:  02/14/2022 Last OV: 02/14/2022 Next OV: N/A Last Refill: 03/15/2022, #[id number] Database:   Please advise

## 2022-03-23 NOTE — Telephone Encounter (Signed)
Pt states pharmacy informed her they did not receive the rx for pregabalin. Please resend rx

## 2022-03-24 ENCOUNTER — Encounter (HOSPITAL_BASED_OUTPATIENT_CLINIC_OR_DEPARTMENT_OTHER): Payer: PRIVATE HEALTH INSURANCE | Admitting: General Surgery

## 2022-03-25 ENCOUNTER — Other Ambulatory Visit: Payer: Self-pay

## 2022-03-25 ENCOUNTER — Emergency Department (HOSPITAL_BASED_OUTPATIENT_CLINIC_OR_DEPARTMENT_OTHER): Payer: PRIVATE HEALTH INSURANCE

## 2022-03-25 ENCOUNTER — Emergency Department (HOSPITAL_BASED_OUTPATIENT_CLINIC_OR_DEPARTMENT_OTHER)
Admission: EM | Admit: 2022-03-25 | Discharge: 2022-03-26 | Disposition: A | Discharge status: Home / Self Care | Payer: PRIVATE HEALTH INSURANCE | Attending: Emergency Medicine | Admitting: Emergency Medicine

## 2022-03-25 ENCOUNTER — Encounter (HOSPITAL_BASED_OUTPATIENT_CLINIC_OR_DEPARTMENT_OTHER): Payer: Self-pay | Admitting: Emergency Medicine

## 2022-03-25 DIAGNOSIS — Z79899 Other long term (current) drug therapy: Secondary | ICD-10-CM | POA: Diagnosis not present

## 2022-03-25 DIAGNOSIS — R1084 Generalized abdominal pain: Secondary | ICD-10-CM | POA: Insufficient documentation

## 2022-03-25 DIAGNOSIS — I2510 Atherosclerotic heart disease of native coronary artery without angina pectoris: Secondary | ICD-10-CM | POA: Insufficient documentation

## 2022-03-25 DIAGNOSIS — Z794 Long term (current) use of insulin: Secondary | ICD-10-CM | POA: Insufficient documentation

## 2022-03-25 DIAGNOSIS — Z7984 Long term (current) use of oral hypoglycemic drugs: Secondary | ICD-10-CM | POA: Insufficient documentation

## 2022-03-25 DIAGNOSIS — I129 Hypertensive chronic kidney disease with stage 1 through stage 4 chronic kidney disease, or unspecified chronic kidney disease: Secondary | ICD-10-CM | POA: Insufficient documentation

## 2022-03-25 DIAGNOSIS — R112 Nausea with vomiting, unspecified: Secondary | ICD-10-CM | POA: Diagnosis not present

## 2022-03-25 DIAGNOSIS — Z7901 Long term (current) use of anticoagulants: Secondary | ICD-10-CM | POA: Diagnosis not present

## 2022-03-25 DIAGNOSIS — R1310 Dysphagia, unspecified: Secondary | ICD-10-CM | POA: Diagnosis not present

## 2022-03-25 DIAGNOSIS — N183 Chronic kidney disease, stage 3 unspecified: Secondary | ICD-10-CM | POA: Diagnosis not present

## 2022-03-25 DIAGNOSIS — Z20822 Contact with and (suspected) exposure to covid-19: Secondary | ICD-10-CM | POA: Diagnosis not present

## 2022-03-25 DIAGNOSIS — R197 Diarrhea, unspecified: Secondary | ICD-10-CM | POA: Diagnosis not present

## 2022-03-25 DIAGNOSIS — E1122 Type 2 diabetes mellitus with diabetic chronic kidney disease: Secondary | ICD-10-CM | POA: Insufficient documentation

## 2022-03-25 LAB — CBC WITH DIFFERENTIAL/PLATELET
Abs Immature Granulocytes: 0.06 10*3/uL (ref 0.00–0.07)
Basophils Absolute: 0 10*3/uL (ref 0.0–0.1)
Basophils Relative: 0 %
Eosinophils Absolute: 0.1 10*3/uL (ref 0.0–0.5)
Eosinophils Relative: 1 %
HCT: 36.2 % (ref 36.0–46.0)
Hemoglobin: 11.7 g/dL — ABNORMAL LOW (ref 12.0–15.0)
Immature Granulocytes: 1 %
Lymphocytes Relative: 4 %
Lymphs Abs: 0.3 10*3/uL — ABNORMAL LOW (ref 0.7–4.0)
MCH: 27.9 pg (ref 26.0–34.0)
MCHC: 32.3 g/dL (ref 30.0–36.0)
MCV: 86.4 fL (ref 80.0–100.0)
Monocytes Absolute: 0.4 10*3/uL (ref 0.1–1.0)
Monocytes Relative: 4 %
Neutro Abs: 8.3 10*3/uL — ABNORMAL HIGH (ref 1.7–7.7)
Neutrophils Relative %: 90 %
Platelets: 215 10*3/uL (ref 150–400)
RBC: 4.19 MIL/uL (ref 3.87–5.11)
RDW: 15.3 % (ref 11.5–15.5)
WBC: 9.1 10*3/uL (ref 4.0–10.5)
nRBC: 0 % (ref 0.0–0.2)

## 2022-03-25 LAB — COMPREHENSIVE METABOLIC PANEL
ALT: 10 U/L (ref 0–44)
AST: 13 U/L — ABNORMAL LOW (ref 15–41)
Albumin: 3.5 g/dL (ref 3.5–5.0)
Alkaline Phosphatase: 98 U/L (ref 38–126)
Anion gap: 8 (ref 5–15)
BUN: 49 mg/dL — ABNORMAL HIGH (ref 8–23)
CO2: 17 mmol/L — ABNORMAL LOW (ref 22–32)
Calcium: 9.1 mg/dL (ref 8.9–10.3)
Chloride: 106 mmol/L (ref 98–111)
Creatinine, Ser: 3.58 mg/dL — ABNORMAL HIGH (ref 0.44–1.00)
GFR, Estimated: 13 mL/min — ABNORMAL LOW (ref >60)
Glucose, Bld: 230 mg/dL — ABNORMAL HIGH (ref 70–99)
Potassium: 5 mmol/L (ref 3.5–5.1)
Sodium: 131 mmol/L — ABNORMAL LOW (ref 135–145)
Total Bilirubin: 0.7 mg/dL (ref 0.3–1.2)
Total Protein: 9.4 g/dL — ABNORMAL HIGH (ref 6.5–8.1)

## 2022-03-25 LAB — URINALYSIS, ROUTINE W REFLEX MICROSCOPIC
Bilirubin Urine: NEGATIVE
Glucose, UA: 250 mg/dL — AB
Ketones, ur: NEGATIVE mg/dL
Leukocytes,Ua: NEGATIVE
Nitrite: NEGATIVE
Protein, ur: >=300 mg/dL — AB
Specific Gravity, Urine: 1.02 (ref 1.005–1.030)
pH: 5.5 (ref 5.0–8.0)

## 2022-03-25 LAB — RESP PANEL BY RT-PCR (FLU A&B, COVID) ARPGX2
Influenza A by PCR: NEGATIVE
Influenza B by PCR: NEGATIVE
SARS Coronavirus 2 by RT PCR: NEGATIVE

## 2022-03-25 LAB — URINALYSIS, MICROSCOPIC (REFLEX)

## 2022-03-25 LAB — PROTIME-INR
INR: 1.1 (ref 0.8–1.2)
Prothrombin Time: 14.3 seconds (ref 11.4–15.2)

## 2022-03-25 LAB — LIPASE, BLOOD: Lipase: 44 U/L (ref 11–51)

## 2022-03-25 LAB — LACTIC ACID, PLASMA: Lactic Acid, Venous: 1 mmol/L (ref 0.5–1.9)

## 2022-03-25 LAB — TROPONIN I (HIGH SENSITIVITY)
Troponin I (High Sensitivity): 20 ng/L — ABNORMAL HIGH (ref <18)
Troponin I (High Sensitivity): 23 ng/L — ABNORMAL HIGH (ref <18)

## 2022-03-25 LAB — APTT: aPTT: 34 seconds (ref 24–36)

## 2022-03-25 MED ORDER — FENTANYL CITRATE PF 50 MCG/ML IJ SOSY
50.0000 ug | PREFILLED_SYRINGE | Freq: Once | INTRAMUSCULAR | Status: AC
  Administered 2022-03-25: 50 ug via INTRAVENOUS
  Filled 2022-03-25: qty 1

## 2022-03-25 MED ORDER — LACTATED RINGERS IV SOLN: INTRAVENOUS | Status: DC

## 2022-03-25 MED ORDER — VANCOMYCIN HCL 500 MG IV SOLR
500.0000 mg | Freq: Once | INTRAVENOUS | Status: AC
  Administered 2022-03-25: 500 mg via INTRAVENOUS

## 2022-03-25 MED ORDER — PROCHLORPERAZINE EDISYLATE 10 MG/2ML IJ SOLN
10.0000 mg | Freq: Four times a day (QID) | INTRAMUSCULAR | Status: DC | PRN
  Administered 2022-03-25: 10 mg via INTRAVENOUS
  Filled 2022-03-25: qty 2

## 2022-03-25 MED ORDER — SUCRALFATE 1 GM/10ML PO SUSP
1.0000 g | Freq: Three times a day (TID) | ORAL | Status: DC
  Filled 2022-03-25: qty 10

## 2022-03-25 MED ORDER — HYDROMORPHONE HCL 1 MG/ML IJ SOLN
1.0000 mg | Freq: Once | INTRAMUSCULAR | Status: AC
Start: 2022-03-25 — End: 2022-03-25
  Administered 2022-03-25: 1 mg via INTRAVENOUS
  Filled 2022-03-25: qty 1

## 2022-03-25 MED ORDER — GLUCAGON HCL RDNA (DIAGNOSTIC) 1 MG IJ SOLR
1.0000 mg | Freq: Once | INTRAMUSCULAR | Status: AC
  Administered 2022-03-25: 1 mg via INTRAVENOUS
  Filled 2022-03-25 (×2): qty 1

## 2022-03-25 MED ORDER — ONDANSETRON HCL 4 MG/2ML IJ SOLN
4.0000 mg | Freq: Once | INTRAMUSCULAR | Status: AC
  Administered 2022-03-25: 4 mg via INTRAVENOUS
  Filled 2022-03-25: qty 2

## 2022-03-25 MED ORDER — METRONIDAZOLE 500 MG/100ML IV SOLN
500.0000 mg | Freq: Once | INTRAVENOUS | Status: AC
  Administered 2022-03-25: 500 mg via INTRAVENOUS
  Filled 2022-03-25: qty 100

## 2022-03-25 MED ORDER — VANCOMYCIN VARIABLE DOSE PER UNSTABLE RENAL FUNCTION (PHARMACIST DOSING)
Status: DC
Start: 2022-03-25 — End: 2022-03-26
  Filled 2022-03-25: qty 1

## 2022-03-25 MED ORDER — LACTATED RINGERS IV BOLUS (SEPSIS)
1000.0000 mL | Freq: Once | INTRAVENOUS | Status: AC
  Administered 2022-03-25: 1000 mL via INTRAVENOUS

## 2022-03-25 MED ORDER — SODIUM CHLORIDE 0.9 % IV SOLN: 2.0000 g | INTRAVENOUS | Status: DC

## 2022-03-25 MED ORDER — PANTOPRAZOLE SODIUM 40 MG IV SOLR
40.0000 mg | Freq: Once | INTRAVENOUS | Status: AC
  Administered 2022-03-25: 40 mg via INTRAVENOUS
  Filled 2022-03-25: qty 10

## 2022-03-25 MED ORDER — VANCOMYCIN HCL IN DEXTROSE 1-5 GM/200ML-% IV SOLN
1000.0000 mg | Freq: Once | INTRAVENOUS | Status: AC
  Administered 2022-03-25: 1000 mg via INTRAVENOUS
  Filled 2022-03-25: qty 200

## 2022-03-25 MED ORDER — SODIUM CHLORIDE 0.9 % IV SOLN
2.0000 g | Freq: Once | INTRAVENOUS | Status: AC
  Administered 2022-03-25: 2 g via INTRAVENOUS
  Filled 2022-03-25: qty 12.5

## 2022-03-25 MED ORDER — SODIUM CHLORIDE 0.9 % IV SOLN: 2.0000 g | Freq: Once | INTRAVENOUS | Status: DC

## 2022-03-25 MED ORDER — PROCHLORPERAZINE MALEATE 10 MG PO TABS: 10.0000 mg | ORAL_TABLET | Freq: Two times a day (BID) | ORAL | 0 refills | Status: DC | PRN

## 2022-03-25 MED ORDER — SUCRALFATE 1 GM/10ML PO SUSP
1.0000 g | Freq: Three times a day (TID) | ORAL | Status: AC
  Administered 2022-03-25: 1 g via ORAL

## 2022-03-25 NOTE — ED Notes (Signed)
Patient states that she has had abdominal pain for three days. States that she has a hernia in her stomach.Vomiting x12.Has medications but does not relieve the nausea

## 2022-03-25 NOTE — ED Provider Notes (Signed)
[facility] Provider Note   CSN: [id number] Arrival date & time: 03/25/22  1835     History {Add pertinent medical, surgical, social history, OB history to HPI:1} Chief Complaint  Patient presents with   Abdominal Pain    [name] [name] is a 66 y.o. adult.   Abdominal Pain     Home Medications Prior to Admission medications   Medication Sig Start Date End Date Taking? Authorizing Provider  acetaminophen (TYLENOL) 325 MG tablet Take 2 tablets (650 mg total) by mouth every 6 (six) hours as needed for mild pain, fever or headache (or Fever >/= 101). Discontinue 500/1000 mg dosing 01/09/22   Little Ishikawa, MD  albuterol (PROVENTIL) (2.5 MG/3ML) 0.083% nebulizer solution Take 3 mLs (2.5 mg total) by nebulization every 6 (six) hours as needed for wheezing or shortness of breath. 02/14/22   Ann Held, DO  albuterol (VENTOLIN HFA) 108 (90 Base) MCG/ACT inhaler Inhale 2 puffs into the lungs every 6 (six) hours as needed for wheezing or shortness of breath. 02/14/22   Ann Held, DO  apixaban (ELIQUIS) 2.5 MG TABS tablet Take 1 tablet (2.5 mg total) by mouth 2 (two) times daily. 02/14/22   Ann Held, DO  atorvastatin (LIPITOR) 10 MG tablet Take 1 tablet (10 mg total) by mouth daily. 01/09/22   Little Ishikawa, MD  carvedilol (COREG) 6.25 MG tablet Take 1 tablet (6.25 mg total) by mouth 2 (two) times daily with a meal. 01/09/22   Little Ishikawa, MD  Continuous Blood Gluc Receiver (FREESTYLE LIBRE 14 DAY READER) DEVI 1 Device by Does not apply route daily. 03/01/20   Copland, Gay Filler, MD  Continuous Blood Gluc Sensor (FREESTYLE LIBRE 14 DAY SENSOR) MISC 1 Device by Does not apply route daily. 03/01/20   Copland, Gay Filler, MD  diazepam (VALIUM) 5 MG tablet Take 1 tablet (5 mg total) by mouth every 12 (twelve) hours as needed for anxiety. 12/19/21   Ann Held, DO  DULoxetine (CYMBALTA) 60 MG capsule Take 1  capsule (60 mg total) by mouth daily. 02/06/22   Ann Held, DO  ferrous sulfate 325 (65 FE) MG tablet Take 1 tablet (325 mg total) by mouth 2 (two) times daily with a meal. 10/22/21   Thurnell Lose, MD  fluticasone-salmeterol (ADVAIR HFA) 115-21 MCG/ACT inhaler Inhale 2 puffs into the lungs 2 (two) times daily. 02/14/22   Ann Held, DO  folic acid (FOLVITE) 1 MG tablet Take 2 tablets by mouth once daily Patient taking differently: Take 2 mg by mouth daily. 11/11/21   Volanda Napoleon, MD  furosemide (LASIX) 20 MG tablet Take 2 tablets (40 mg total) by mouth daily. 01/09/22   Little Ishikawa, MD  HYDROcodone-acetaminophen (NORCO/VICODIN) 5-325 MG tablet Take 1 tablet by mouth every 6 (six) hours as needed for moderate pain. 03/06/22   Wendling, Crosby Oyster, DO  insulin glargine (LANTUS SOLOSTAR) 100 UNIT/ML Solostar Pen INJECT 20 UNITS SUBCUTANEOUSLY IN THE MORNING AND 40 AT NIGHT Patient taking differently: 15-18 Units at bedtime. 05/18/21   [name] R, DO  montelukast (SINGULAIR) 10 MG tablet Take 1 tablet (10 mg total) by mouth at bedtime. 02/14/22   Ann Held, DO  Multiple Vitamin (MULTIVITAMIN WITH MINERALS) TABS tablet Take 1 tablet by mouth daily. 02/18/20   Angiulli, Lavon Paganini, PA-C  NONFORMULARY OR COMPOUNDED ITEM Power wheelchair  #1  as directed  12/19/21   Ann Held, DO  ondansetron (ZOFRAN) 8 MG tablet Take 0.5 tablets (4 mg total) by mouth every 8 (eight) hours as needed for nausea or vomiting. 03/09/22   Carollee Herter, Alferd Apa, DO  pregabalin (LYRICA) 75 MG capsule Take 1 capsule (75 mg total) by mouth 3 (three) times daily. 03/23/22   [name] R, DO  VITAMIN D PO Take 125 mcg by mouth daily.    [provider]  metFORMIN (GLUCOPHAGE) 1000 MG tablet Take 1,000 mg by mouth 2 (two) times daily with a meal.    12/19/21  [provider]  omeprazole (PRILOSEC) 20 MG capsule Take 20 mg by mouth daily.     12/19/21  [provider]      Allergies    Bee pollen, Fish-derived products, Mushroom extract complex, Penicillins, Rosemary oil, Shellfish allergy, Tomato, Acetaminophen, Aloe vera, Broccoli [brassica oleracea], Acyclovir and related, and Naproxen    Review of Systems   Review of Systems  Gastrointestinal:  Positive for abdominal pain.   Physical Exam Updated Vital Signs BP 110/66 (BP Location: Right Arm)   Pulse (!) 106   Temp (!) 97.2 F (36.2 C) (Tympanic)   Resp (!) 30   Ht 5' 1"  (1.549 m)   Wt 72.6 kg   SpO2 96%   BMI 30.23 kg/m  Physical Exam  ED Results / Procedures / Treatments   Labs (all labs ordered are listed, but only abnormal results are displayed) Labs Reviewed  BASIC METABOLIC PANEL  CBC  LACTIC ACID, PLASMA  LACTIC ACID, PLASMA  TROPONIN I (HIGH SENSITIVITY)    EKG None  Radiology No results found.  Procedures Procedures  {Document cardiac monitor, telemetry assessment procedure when appropriate:1}  Medications Ordered in ED Medications - No data to display  ED Course/ Medical Decision Making/ A&P                           Medical Decision Making Amount and/or Complexity of Data Reviewed Labs: ordered.   ***  {Document critical care time when appropriate:1} {Document review of labs and clinical decision tools ie heart score, Chads2Vasc2 etc:1}  {Document your independent review of radiology images, and any outside records:1} {Document your discussion with family members, caretakers, and with consultants:1} {Document social determinants of health affecting pt's care:1} {Document your decision making why or why not admission, treatments were needed:1} Final Clinical Impression(s) / ED Diagnoses Final diagnoses:  None    Rx / DC Orders ED Discharge Orders     None

## 2022-03-25 NOTE — ED Notes (Signed)
Patient transported to CT 

## 2022-03-25 NOTE — Progress Notes (Signed)
Pharmacy Antibiotic Note  [name] [name] is a 66 y.o. adult admitted on 03/25/2022 presenting with abdominal pain, N/V/D, hx of chronic bilateral foot infections, concern for sepsis.  Pharmacy has been consulted for vancomycin and aztreonam dosing.  Pt has tolerated cephalosporins in past so will proceed with cefepime   Plan: Vancomycin 1500 mg IV x 1, then variable dosing d/t unstable renal function Cefepime 2g IV every 24 hours Monitor renal function, Cx and clinical progression to narrow Vancomycin levels as needed  Height: 5' 1"  (154.9 cm) Weight: 72.6 kg (160 lb) IBW/kg (Calculated) : 47.8  Temp (24hrs), Avg:97.2 F (36.2 C), Min:97.2 F (36.2 C), Max:97.2 F (36.2 C)  Recent Labs  Lab 03/22/22 1300 03/25/22 1911  WBC 7.6 9.1  CREATININE 3.88* 3.58*  LATICACIDVEN  --  1.0    Estimated Creatinine Clearance (by C-G formula based on SCr of 3.58 mg/dL (H)) Female: 14.1 mL/min (A) Female: 17.3 mL/min (A)    Allergies  Allergen Reactions   Bee Pollen Anaphylaxis   Fish-Derived Products Hives, Shortness Of Breath, Swelling and Rash    Hives get in throat causing trouble breathing   Mushroom Extract Complex Anaphylaxis   Penicillins Anaphylaxis    **Tolerated cefepime March 2021 Did it involve swelling of the face/tongue/throat, SOB, or low BP? Yes Did it involve sudden or severe rash/hives, skin peeling, or any reaction on the inside of your mouth or nose? No Did you need to seek medical attention at a hospital or doctor's office? Yes When did it last happen?  A few months ago If all above answers are "NO", may proceed with cephalosporin use.   Rosemary Oil Anaphylaxis   Shellfish Allergy Hives, Shortness Of Breath, Swelling and Rash   Tomato Hives and Shortness Of Breath    Hives in throat causes her trouble breathing   Acetaminophen Other (See Comments)    GI upset   Aloe Vera Hives   Broccoli [Brassica Oleracea] Hives   Acyclovir And Related Other (See Comments)     Unknown reaction   Naproxen Other (See Comments)    Unknown reaction    Bertis Ruddy, PharmD Clinical Pharmacist ED Pharmacist Phone # [phone] 03/25/2022 7:57 PM

## 2022-03-25 NOTE — ED Triage Notes (Signed)
Pt arrives pov, to triage in wheelchair, c/o abdominal pain with n/v/d and endorses feeling that "something is stuck in my throat" x 3 days, worse today. Pt also reports left side of neck is swollen and tender. Pt vomiting in triage

## 2022-03-25 NOTE — Progress Notes (Signed)
Elink following for Sepsis Protocol 

## 2022-03-25 NOTE — Discharge Instructions (Addendum)
Your CT imaging was negative for acute abnormalities.  You are tolerating oral intake on repeat assessment.  Your laboratory work-up was reassuring.  Return to the emergency department for inability to tolerate oral intake in the setting of throat swelling, development of fever and chills, severe abdominal pain, nausea and vomiting. If your sensation of throat pain and something being stuck in your throat persists, you could benefit from endoscopy by gastroenterology

## 2022-03-27 LAB — URINE CULTURE: Culture: <10000 — AB

## 2022-03-28 ENCOUNTER — Telehealth: Payer: Self-pay | Admitting: Family Medicine

## 2022-03-28 ENCOUNTER — Telehealth: Payer: Self-pay

## 2022-03-28 NOTE — Telephone Encounter (Signed)
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 Dr.Campbell if you could put order in for port removal please I will call to set up appointment.    [place], CMA

## 2022-03-28 NOTE — Telephone Encounter (Signed)
Patient would like a letter stating she does not have to attend jury duty 6/12 due to her health.

## 2022-03-29 ENCOUNTER — Encounter: Payer: Self-pay | Admitting: *Deleted

## 2022-03-29 ENCOUNTER — Telehealth: Payer: Self-pay | Admitting: *Deleted

## 2022-03-29 NOTE — Telephone Encounter (Signed)
Spoke with patient. She will have to call back with juror number

## 2022-03-29 NOTE — Telephone Encounter (Signed)
Per 03/22/22 los - called and lvm of upcoming appointments - requested call back to confirm - mailed calendar

## 2022-03-29 NOTE — Telephone Encounter (Signed)
Pt called back with info:  Juror #: [id number] Summons Date: 6.12.23

## 2022-03-29 NOTE — Telephone Encounter (Signed)
Placed in folder for provider signature.

## 2022-03-30 ENCOUNTER — Encounter: Admit: 2022-03-30 | Discharge: 2022-03-30 | Payer: MEDICARE

## 2022-03-30 LAB — CULTURE, BLOOD (ROUTINE X 2)
Culture: NO GROWTH
Culture: NO GROWTH
Special Requests: ADEQUATE
Special Requests: ADEQUATE

## 2022-03-30 NOTE — Telephone Encounter (Signed)
Letter updated and patient notified that letter is ready for pickup.  Her husband Marguerite Olea will be picking up.

## 2022-03-30 NOTE — Telephone Encounter (Signed)
Pt called stating that the Summons Date is actually 6.14.23

## 2022-03-31 ENCOUNTER — Encounter (HOSPITAL_BASED_OUTPATIENT_CLINIC_OR_DEPARTMENT_OTHER): Payer: PRIVATE HEALTH INSURANCE | Attending: General Surgery | Admitting: General Surgery

## 2022-03-31 DIAGNOSIS — M86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, right ankle and foot: Secondary | ICD-10-CM | POA: Diagnosis not present

## 2022-03-31 DIAGNOSIS — E1122 Type 2 diabetes mellitus with diabetic chronic kidney disease: Secondary | ICD-10-CM | POA: Diagnosis not present

## 2022-03-31 DIAGNOSIS — E785 Hyperlipidemia, unspecified: Secondary | ICD-10-CM | POA: Diagnosis not present

## 2022-03-31 DIAGNOSIS — K760 Fatty (change of) liver, not elsewhere classified: Secondary | ICD-10-CM | POA: Insufficient documentation

## 2022-03-31 DIAGNOSIS — M86672 Other chronic osteomyelitis, left ankle and foot: Secondary | ICD-10-CM | POA: Insufficient documentation

## 2022-03-31 DIAGNOSIS — L97524 Non-pressure chronic ulcer of other part of left foot with necrosis of bone: Secondary | ICD-10-CM | POA: Insufficient documentation

## 2022-03-31 DIAGNOSIS — G4730 Sleep apnea, unspecified: Secondary | ICD-10-CM | POA: Diagnosis not present

## 2022-03-31 DIAGNOSIS — L97514 Non-pressure chronic ulcer of other part of right foot with necrosis of bone: Secondary | ICD-10-CM | POA: Diagnosis not present

## 2022-03-31 DIAGNOSIS — E11621 Type 2 diabetes mellitus with foot ulcer: Secondary | ICD-10-CM | POA: Diagnosis present

## 2022-03-31 DIAGNOSIS — I2510 Atherosclerotic heart disease of native coronary artery without angina pectoris: Secondary | ICD-10-CM | POA: Insufficient documentation

## 2022-03-31 DIAGNOSIS — E1142 Type 2 diabetes mellitus with diabetic polyneuropathy: Secondary | ICD-10-CM | POA: Insufficient documentation

## 2022-03-31 DIAGNOSIS — I252 Old myocardial infarction: Secondary | ICD-10-CM | POA: Insufficient documentation

## 2022-03-31 DIAGNOSIS — I129 Hypertensive chronic kidney disease with stage 1 through stage 4 chronic kidney disease, or unspecified chronic kidney disease: Secondary | ICD-10-CM | POA: Insufficient documentation

## 2022-04-03 ENCOUNTER — Ambulatory Visit (HOSPITAL_COMMUNITY): Admission: RE | Admit: 2022-04-03 | Payer: PRIVATE HEALTH INSURANCE | Source: Ambulatory Visit

## 2022-04-03 ENCOUNTER — Encounter: Admit: 2022-04-03 | Discharge: 2022-04-03 | Payer: MEDICARE

## 2022-04-03 DIAGNOSIS — R5383 Other fatigue: Secondary | ICD-10-CM

## 2022-04-03 NOTE — Progress Notes (Signed)
ADIN, LARICCIA ([id number]) Visit Report for 03/31/2022 Chief Complaint Document Details Patient Name: Date of Service: [name] [name] RE, [name] 03/31/2022 3:30 PM Medical Record Number: [id number] Patient Account Number: [id number] Date of Birth/Sex: Treating RN: 1956/01/27 (66 y.o. [name] [name] Primary Care Provider: [name] Other Clinician: Referring Provider: Treating Provider/Extender: Maxwell Marion in Treatment: 17 Information Obtained from: Patient Chief Complaint Left heel ulcer 01/10/2021; patient returns to clinic with 2 large wounds on her bilateral heels 11/29/2021; patient returns to clinic with substantial wounds on the right foot and left heel. Electronic Signature(s) Signed: 03/31/2022 4:38:14 PM By: Fredirick Maudlin MD FACS Entered By: Fredirick Maudlin on 03/31/2022 16:38:14 -------------------------------------------------------------------------------- Debridement Details Patient Name: Date of Service: [name], [name] 03/31/2022 3:30 PM Medical Record Number: [id number] Patient Account Number: [id number] Date of Birth/Sex: Treating RN: 04-04-1956 (66 y.o. [name] [name] Primary Care Provider: [name] Other Clinician: Referring Provider: Treating Provider/Extender: Maxwell Marion in Treatment: 17 Debridement Performed for Assessment: Wound #11 Left Calcaneus Performed By: Physician Fredirick Maudlin, MD Debridement Type: Debridement Severity of Tissue Pre Debridement: Fat layer exposed Level of Consciousness (Pre-procedure): Awake and Alert Pre-procedure Verification/Time Out Yes - 16:10 Taken: Start Time: 16:10 T Area Debrided (L x W): otal 1.4 (cm) x 1.2 (cm) = 1.68 (cm) Tissue and other material debrided: Non-Viable, Callus, Slough, Subcutaneous, Slough Level: Skin/Subcutaneous Tissue Debridement Description: Excisional Instrument: Curette Bleeding: Minimum Hemostasis  Achieved: Pressure End Time: 16:14 Procedural Pain: 0 Post Procedural Pain: 0 Response to Treatment: Procedure was tolerated well Level of Consciousness (Post- Awake and Alert procedure): Post Debridement Measurements of Total Wound Length: (cm) 1.4 Width: (cm) 1.2 Depth: (cm) 0.4 Volume: (cm) 0.528 Character of Wound/Ulcer Post Debridement: Improved Severity of Tissue Post Debridement: Fat layer exposed Post Procedure Diagnosis Same as Pre-procedure Electronic Signature(s) Signed: 03/31/2022 4:57:28 PM By: Fredirick Maudlin MD FACS Signed: 03/31/2022 5:25:42 PM By: [name] RN, BSN Entered By: [name] on 03/31/2022 16:18:14 -------------------------------------------------------------------------------- HPI Details Patient Name: Date of Service: [name], [name] 03/31/2022 3:30 PM Medical Record Number: [id number] Patient Account Number: [id number] Date of Birth/Sex: Treating RN: May 06, 1956 (66 y.o. [name] [name] Primary Care Provider: [name] Other Clinician: Referring Provider: Treating Provider/Extender: Maxwell Marion in Treatment: 17 History of Present Illness HPI Description: ADMISSION 11/12/2018 This is a 66 year old woman with type 2 diabetes and diabetic neuropathy. She has been dealing with a left heel plantar wound for roughly 2 years. She states this started when she pulled some skin off the area and it progressed into a wound. She also has an area on the tip of her left great toe for 1 year. She has been largely followed by Dr. Sharol Given and she had an excision of bone in the left heel in 2017. I do not see microbiology from this excision or pathology. Apparently this wound never really closed. She most recently has been using Silvadene cream. Offloading this with a scooter. The last MRI I see was in June 2018 which did not show osteomyelitis at that time. Apparently this wound is never really progressed towards  healing. During her last review by Dr. Sharol Given in October it was recommended that she undergo a left BKA and she refused. She went to see a second orthopedic consult at [facility] who am recommended conservative wound care to see if this will close or progress towards closure but also warned that possible surgery  may be necessary. An x-ray that was done at [facility] showed an irregular calcaneal body and calcaneal tuberosity. This is probably postprocedural. Previous x-rays at [facility] had suggested heterotrophic calcifications but I do not see this. Apparently a second ointment was added to the Silvadene which the patient thinks is because some improvement. She has not been systemically unwell. No fever or chills. She thinks the second ointment that was given to her at [facility] has helped somewhat. 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 ABIs done in June 2018 showed a ABI of 1.12 on the right and 1.1 on the left she was biphasic bilaterally. ABIs in our clinic were noncompressible today. 11/20/18 on evaluation today patient is seen for her second visit here in the office although this is actually the first visit with me concerning an issue that she's having with her great toe and heel of the left foot. Fortunately she does not appear to be have any discomfort at this time and she does have a scooter in order to offload her foot she also has a boot for offloading. With that being said this is something that appears to have been going on for some time she was seeing Dr. due to his recommendation was that she was going to require a below knee amputation. With that being said she wanted to come to the wound  center but according to the patient Dr. Sharol Given told her that "we could not help her". Nonetheless she saw another provider who suggested that it may be worth a shot for [name] to try and help her out if it all possible. Nonetheless she decided that it would be worth a trial since otherwise any the way she's gonna end up with an amputation. Obviously if we can heal the wound then that will not be the case. Again I explained to her that obviously there are no guarantees but we will give this a good try and attempts to get the wound to heal. 1/30; the patient continues to have areas on the left plantar heel and the left plantar great toe. The more worrisome area is the heel. She went for MRI on Saturday but this could not be done because she had silver alginate in the wound bed. I would like to get this rebooked. If she does not have osteomyelitis she will need a total contact cast. We have been using silver alginate in the wounds 2/7; patient has wounds on her left plantar heel and left plantar great toe. The area on the heel has some depth although it looks about the same today. Her MRI will finally be done tomorrow. If she has osteomyelitis in the heel and then we will need to consider her for IV antibiotics and hyperbaric oxygen. If the MRI is negative she will need a total contact cast although she is wearing a cam boot and using a scooter at present. Will be using silver alginate. She will take it off tomorrow in preparation for the MRI 2/14; the patient's MRI showed extensive surgical changes with a large portion of her calcaneus removed on the left secondary to her previous surgery by Dr. Sharol Given however there is no evidence of osteomyelitis. She would therefore be  a candidate for a total contact cast and we applied this for the first time today 2/21; silver collagen total contact cast. The area on the plantar left great toe is "healed" still a lot of callus on this area. Dimensions on the plantar heel not  too much different some epithelialization is present however. This is an improvement 12/25/18 on evaluation today patient actually is seen for follow-up concerning issues that she has been having with her cast she states she feels like it's wet and squishy in the bottom of her cast. The reason she does come in to have this evaluated and see what is going on. Fortunately there's no signs of infection at this time was the cast was removed. 3/6; the patient's area on the tip of her right great toe is callused but there is no open area here. She still has the fairly extensive area in the left heel. We have been using silver collagen in the wound. 01/08/19 on evaluation today patient actually appears to be doing very well in regard to her heel ulcer in my pinion to see if you shown signs of improvement which is excellent news. There's no evidence of infection. She continues to have quite a bit of drainage but fortunately again this doesn't seem to be hindering her healing. 3/20 -Patient's foot ulcer on the left appears to be doing well, the dimensions appear encouraging, we have been using total contact cast with Prisma and will continue doing that 3/27; patient continues to make nice progress on the left plantar heel. She is using silver collagen under a total contact cast. It is been a while since I have seen this wound and it really looks a lot better. Smaller with healthy granulation 4/3; she continues to make nice progress on the left plantar heel. Using silver collagen under a total contact cast 4/10; left plantar heel. Again skin over the surface of the wound with not much in the way of adherence. This results in undermining. Using silver collagen changed to silver alginate 4/17 left plantar heel. Again not much improvement. There is no undermining today no debridement was required we used silver alginate last time because of excessive moisture 4/24 left plantar heel. Again not as much improvement  as I would have liked. About 3 mm of depth. Thick callused skin around the circumference. Using silver alginate 5/1; left plantar heel this is improved this week. Less depth epithelialization is present. Using silver alginate alginate under a total contact cast 5/8; left plantar heel dimensions are about the same. The depth appears to be improved. I use silver collagen starting today under the cast 5/15 left plantar heel. Arrives today with a 2-day history of feeling like something had "slipped" while walking her dog in the cast. Unfortunately extensive area relatively to the small wound of undermining superiorly denuded epithelium. 5/22-Patient returns at 1 week after being taken off the TCC on account of some fluid collection that was debrided and cultured at last visit from the plantar ulcer on the left heel. The culture results are polymicrobial with Klebsiella, enterococcus, Proteus growth these organisms are sensitive to Cipro except with enterococcus which is sensitive to ampicillin but patient is highly allergic to penicillin according to her. 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 5/29; the patient arrived last week with a new wound on her left plantar great toe. With regards to the culture that I  did 2 weeks ago of her deteriorating heel wound this grew Klebsiella and enterococcus. She was given Cipro however the enterococcus would not be covered well by a quinolone. She is allergic to penicillin. I will give her linezolid 600 twice daily x5 days 6/12; the patient continues to have a wound on her left and after she returns to the beach there is undermining laterally. She has the new wound from this 2 weeks or so ago on the plantar tip of her left great toe. She had a fall today scraping the dorsal surface of the left fifth 7/14; READMISSION since the patient was last here she was hospitalized from  04/15/2019 through 04/22/2019. She had presented to an outside ER after falling and hitting her elbow. She had had previous surgery on the elbow and fractures several years ago. An x-ray was negative and she was sent home. She is readmitted with sepsis and acute renal failure secondary to septic arthritis of the elbow. Cultures apparently showed pansensitive staph aureus however she has a severe beta-lactam allergy. She was treated with vancomycin. Apparently the vancomycin is completed and her PICC line is removed although she is seeing Dr. Megan Salon tomorrow due to continued pain and swelling. In the hospital she had an IandD by Dr. [name] of orthopedics. The original surgery was on 04/17/2019 and it was felt that she had septic arthritis. She is still having a lot of pain and swelling in the right elbow and apparently is due to see Dr. Megan Salon tomorrow and what she thinks is that she will be restarted on antibiotics. With regards to her heel wounds/left foot wounds. Her arterial studies were checked and her ABIs were within normal limits. X-ray showed plantar foot ulcer negative for osteomyelitis postop resection of the posterior calcaneus. She has been using silver alginate to the heel 8/4-Patient returns after being seen on 7/14, we are using silver alginate to the calcaneal wound, she is continuing to receive care for her right elbow septic arthritis 8/14- Patient returns after 1 week, she is being seen by the surgical group for her right elbow, she is here for the left calcaneal wound for which we are using silver alginate this is about the same 8/21; this is a patient I readmitted to the clinic 5 weeks ago. She is continuing to have difficulties with a septic arthritis of the right elbow she had after a fall and apparently has had surgical IandD's since the last time we saw her. She is also following with Dr. Megan Salon of infectious disease and is apparently on 3 oral antibiotics although at the  time of this dictation I am not sure what they are. She comes in today with a necrotic surface on the left great toe with a blister laterally. This was still clearly an open wound. She had purulent drainage coming out of this. The original wound on the plantar aspect of her right heel in the setting of a Charcot foot is deep not open to bone but certainly not any better at all. We have been using silver alginate 8/28; dealing with septic arthritis of the right elbow. May need to go for further surgery here in the second week of September. She had a blister on the left great toe that was purulent [place] draining last week although a culture did not grow anything [already on antibiotics through infectious disease for her elbow]. The punched-out area on her heel is just like it was when she first came in. This almost closed  with a total contact cast there are no options for that now. The patient states she cannot stay off her foot having to do housework X-ray of the foot showed a moderate bunion and severe degenerative changes at the first metatarsal phalangeal joint there was a moderate plantar calcaneal spur. We will need to check the location of this. 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 It is oral. We are using silver collagen to the punched-out area on her left heel and to the summer superficial area of the left great toe. She is offloading this is best she can although judging by the amount of callus it is not enough. She thinks she has enough strength in her right elbow now to use her scooter. 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 Hale Bogus notes and infectious disease. We  are using silver collagen to the punched-out areas on her left heel and to the plantar aspect of her left great toe she is now using her scooter to get around and to help offload these areas 10/2; 2-week follow-up. Still on clindamycin I believe for the left elbow she is going for an MRI of the elbow over the weekend. She is then going to see orthopedics and infectious disease. The area on the left heel is certainly no better. This does not probe to bone however there is green drainage. She has an area on the tip of her toe. The area on the heel is in a wound we almost closed at one point with total contact casting. 10/9; one-week follow-up. Culture I did of the heel last week which was a swab culture admittedly showed moderate Enterococcus faecalis moderate Pseudomonas. The wound itself looks about the same. There is no palpable bone although the depth of it closely approximates bone. The heel is swollen but not erythematous. Her MRI is booked for next week. She also has an MRI of the right elbow. I've also lifted the last consult from Dr. Megan Salon who is following her for septic arthritis of the right elbow. I did not see him specifically comment on her left heel however the patient states that he is aware of this. I did not specifically address the organisms I cultured last week because of the possible effect of any additional cultures that will be done on the elbow. Additionally these were superficial not bone cultures 10/16; her MRI of the heel was put back to next week. She did have her elbow done. I have been in contact with Dr. Megan Salon about my concerns about osteomyelitis of the left heel. We have been using collagen. She also has a wound on the plantar tip of her first toe 10/23; her MRI of the heel was negative for osteomyelitis but suggested a small abscess. She also had an MRI of her elbow that showed findings compatible with a septic joint. She went to see Dr. Megan Salon and she is going  to have both oral and IV antibiotics which I am sure will cover any infection in the heel. I did not actually see his note today. We are using silver collagen offloading the heel with a heel offloading boot  11/6; patient continues with silver collagen to both wound areas on her plantar left heel and plantar left great toe. She remains on daptomycin by Dr. Megan Salon of infectious disease. She complains of diarrhea. Dr. Megan Salon is aware of this. 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 she has been put on oral vancomycin I think a lot of high clinical suspicion for pseudomembranous colitis Because of the right elbow there are no options to completely off her left foot beyond the heel offloading boot we have now. Fortunately the left heel ulcer itself has remained static some improvement in the plantar left great toe 11/20; patient arrives for review of her left heel ulcer. I also note she has a difficult time with septic arthritis of the right elbow. She currently is on IV daptomycin which seems to have helped the drainage in her elbow [MSSA]. Because of high concern for pseudomembranous colitis she was also put on vancomycin orally and Flagyl orally. She was on Levaquin but that was discontinued because of the concern about C. difficile. She states her diarrhea is better. She arrives in clinic today with a large area of denuded skin on the lateral part of the heel. This had totally separated. We have been using silver alginate to the wound areas. She arrived in a scooter telling me she is offloading the foot is much as possible. 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 Dr.  Megan Salon stopped the daptomycin and now has her on vancomycin 1000 mg IV daily. This is supposed to go onto 1/18. She is being referred to [place] for what sounds like a elbow replacement type operation for her MSSA septic arthritis. She arrives in clinic today with a blister on the medial part of the wound on her heel. She still has a lot of callus around the heel although the surface of the wound on the left heel looks somewhat better. READMISSION 03/19/2020 Mrs. Wilborn is a 66 year old woman we have followed for almost all of 2020 with a neuropathic wound on her left heel and the tip of her left great toe.. We she had previously had a partial calcanectomy by Dr. Sharol Given because of underlying osteomyelitis. She also had a history of MRSA even when we admitted her to the clinic. An MRI when she first came into our clinic did not show osteomyelitis. We put her in a total contact cast and gradually this contracted to the point it was almost closed however in that same timeframe she had a fall. 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 We had to stop putting her in a cast partially because of infection and partially because she could not support herself with the elbow. Things went progressively downhill. When we last saw her at the end of December she had a large open wound on the left heel. She had had recurrent infections in the right elbow. It was felt that either the heel lift: Ice her elbow or vice versa. We never had MRSA I do not believe cultured in the left heel however. 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 She underwent an operative  debridement and a T11-L3 fusion. An operative culture grew Pseudomonas. She was treated with cefepime and required readmission to the hospital with acute kidney failure requiring temporary dialysis. She was discharged to rehab I believe she signed herself out [place]. He has been following with her primary doctor and she comes in the clinic today with miraculously the left heel totally closed. She is followed up with Dr. Megan Salon of infectious disease he does not feel she has any evidence of infection in her elbow or her heel for that matter and the heel is actually fully epithelialized. She is on ciprofloxacin 500 twice daily I think mostly directed at Pseudomonas. The patient is convinced that it was the treatment of the Pseudomonas in her back that ultimately led to closure of her heel and that certainly possible or could have been because she was nonambulatory in the hospital for 3 months according to her. She is still using her heel offloading boot and she says she is "learning to walk again" Readmission: 06/09/2020 patient presents today for reevaluation here in the clinic following a reopening of her heel ulcer. She tells [name] that this actually occurred about 2 weeks after she was last seen in May of this year though it is now the beginning of August and she has not come back until now to see [name]. Nonetheless she does have an open wound there is been no x-rays at this time infectious disease has been monitoring him following this up until this point. They made the recommendation that she come to see [name]. Fortunately there is no signs of active infection systemically though she does tell me that she had Pseudomonas in the spine when she had her back surgery she is currently on antibiotics for that. 8/20; this is a patient who I saw 1 time in late May. Apparently shortly after she was here the area on her plantar left heel started to drain again and she had an open wound. She has been going to her primary physician and has been following with Dr. Megan Salon. 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 She is currently on 3 times a day prophylactic ciprofloxacin. She  is admitted to the clinic last week for review of the wound on the left heel. Notable for the fact that she had a partial calcanectomy by Dr. Sharol Given 3 or 4 years ago. She says she is using her motorized wheelchair again as an off loader but she came in in regular shoes. 9/2; 2-week follow-up. Plantar left heel. In spite of debridement last week she had thick very adherent callus around the circumference of the wound and a nonviable gray surface. We have been using Hydrofera Blue. We gave her a heel offloading boot last week. She says she is off her foot except for she needs to walk her dog. 9/17; 2 week follow-up. Lives with a heel ulcer in roughly the same condition as a month ago. Though requiring debridement and revision using Hydrofera Blue. She says she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 Burn Musculoskeletal Patient has history of Osteoarthritis, Osteomyelitis - left and right foot Denies history of Gout, Rheumatoid Arthritis Neurologic Patient has history of Neuropathy Denies history of Dementia, Quadriplegia, Paraplegia, Seizure Disorder Oncologic Denies history of Received Chemotherapy, Received Radiation Psychiatric Denies history of Anorexia/bulimia, Confinement Anxiety Hospitalization/Surgery History - left heel surgery. - hysterectomy. - cholecystectomy. - right elbow surgery x4. - December 2021. Medical A Surgical History Notes nd Constitutional Symptoms (General Health) morbid obesity, fibromyalgia Eyes macular degeneration left eye Ear/Nose/Mouth/Throat seasonal allergies Cardiovascular hyperlipidemia Gastrointestinal fatty liver Genitourinary CKD stage 3 Neurologic cervical ruptured disc Objective Constitutional She is hypertensive, but asymptomatic.[name] Kitchen No acute distress. Vitals Time Taken: 3:35 PM, Height: 61 in, Weight: 169 lbs, BMI: 31.9, Temperature: 98.7 F, Pulse: 73 bpm, Respiratory Rate: 18 breaths/min, Blood Pressure: 164/75 mmHg. Respiratory Normal work of breathing on room air. General Notes: 03/31/2022: The right foot wound remains closed. The left wound is quite a bit shallower and smaller today. There is some slough and periwound callus accumulation. Integumentary (Hair, Skin) Wound #11 status is Open. Original cause of wound was  Gradually Appeared. The date acquired was: 10/30/2020. The wound has been in treatment 17 weeks. The wound is located on the Left Calcaneus. The wound measures 1.4cm length x 1.2cm width x 0.4cm depth; 1.319cm^2 area and 0.528cm^3 volume. There is Fat  Layer (Subcutaneous Tissue) exposed. There is no tunneling or undermining noted. There is a medium amount of serosanguineous drainage noted. The wound margin is flat and intact. There is medium (34-66%) red granulation within the wound bed. There is a medium (34-66%) amount of necrotic tissue within the wound bed including Adherent Slough. 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 There was a Excisional Skin/Subcutaneous Tissue Debridement with a total area of 1.68 sq cm performed by Fredirick Maudlin, MD. With the following instrument(s): Curette to remove Non-Viable tissue/material. Material removed includes Callus, Subcutaneous Tissue, and Slough. No specimens were taken. A time out was conducted at 16:10, prior to the start of the procedure. A Minimum amount of bleeding was controlled with Pressure. The procedure was tolerated well with a pain level of 0 throughout and a pain level of 0 following the procedure. Post Debridement Measurements: 1.4cm length x 1.2cm width x 0.4cm depth; 0.528cm^3 volume. Character of Wound/Ulcer Post Debridement is improved. Severity of Tissue Post Debridement is: Fat layer exposed. Post procedure Diagnosis Wound #11: Same as  Pre-Procedure Plan Follow-up Appointments: Return Appointment in 2 weeks. - Dr. Celine Ahr Rm 3 Friday 6/16 @ 3:15 pm Bathing/ Shower/ Hygiene: May shower with protection but do not get wound dressing(s) wet. Edema Control - Lymphedema / SCD / Other: Elevate legs to the level of the heart or above for 30 minutes daily and/or when sitting, a frequency of: - throughout the day Avoid standing for long periods of time. Moisturize legs daily. WOUND #11: - Calcaneus Wound Laterality: Left Cleanser: Normal Saline Every Other Day/30 Days Discharge Instructions: Cleanse the wound with Normal Saline prior to applying a clean dressing using gauze sponges, not tissue or cotton balls. Cleanser: Wound Cleanser Every Other Day/30 Days Discharge Instructions: Cleanse the wound with wound cleanser prior to applying a clean dressing using gauze sponges, not tissue or cotton balls. Peri-Wound Care: Zinc Oxide Ointment 30g tube Every Other Day/30 Days Discharge Instructions: Apply Zinc Oxide to periwound with each dressing change Prim Dressing: KerraCel Ag Gelling Fiber Dressing, 4x5 in (silver alginate) Every Other Day/30 Days ary Discharge Instructions: Apply silver alginate to wound bed as instructed Secondary Dressing: ABD Pad, 5x9 Every Other Day/30 Days Discharge Instructions: Apply over primary dressing as directed. Secondary Dressing: Optifoam Non-Adhesive Dressing, 4x4 in Every Other Day/30 Days Discharge Instructions: Apply over primary dressing to make foam donut Secondary Dressing: Woven Gauze Sponge, Non-Sterile 4x4 in Every Other Day/30 Days Discharge Instructions: Apply over primary dressing as directed. Secured With: [employer], 4.5x3.1 (in/yd) Every Other Day/30 Days Discharge Instructions: Secure with Kerlix as directed. Secured With: 62M Medipore Public affairs consultant Surgical T 2x10 (in/yd) Every Other Day/30 Days ape Discharge Instructions: Secure with tape as directed. 03/31/2022: The right  foot wound remains closed. The left wound is quite a bit shallower and smaller today. There is some slough and periwound callus accumulation. I used a curette to debride the slough from the wound surface and callus from the periwound. We will continue using silver alginate. Follow-up in 2 weeks, as is her preference.  Electronic Signature(s) Signed: 03/31/2022 4:40:24 PM By: Fredirick Maudlin MD FACS Entered By: Fredirick Maudlin on 03/31/2022 16:40:23 -------------------------------------------------------------------------------- HxROS Details Patient Name: Date of Service: [name], [name] 03/31/2022 3:30 PM Medical Record Number: [id number] Patient Account Number: [id number] Date of Birth/Sex: Treating RN: 1956-10-16 (66 y.o. [name] [name] Primary Care Provider: [name] Other Clinician: Referring Provider: Treating Provider/Extender: Maxwell Marion in Treatment: 17 Information Obtained From Patient Constitutional Symptoms (General Health) Medical History: Past Medical History Notes: morbid obesity, fibromyalgia Eyes Medical History: Positive for: Cataracts - left eye removed, mild right eye Negative for: Glaucoma; Optic Neuritis Past Medical History Notes: macular degeneration left eye Ear/Nose/Mouth/Throat Medical History: Negative for: Chronic sinus problems/congestion; Middle ear problems Past Medical History Notes: seasonal allergies Hematologic/Lymphatic Medical History: Positive for: Anemia Negative for: Hemophilia; Human Immunodeficiency Virus; Lymphedema; Sickle Cell Disease Respiratory Medical History: Positive for: Sleep Apnea Negative for: Aspiration; Asthma; Chronic Obstructive Pulmonary Disease (COPD); Pneumothorax; Tuberculosis Cardiovascular Medical History: Positive for: Coronary Artery Disease; Hypertension; Myocardial Infarction - 2014 Past Medical History Notes: hyperlipidemia Gastrointestinal Medical  History: Negative for: Cirrhosis ; Colitis; Crohns; Hepatitis A; Hepatitis B; Hepatitis C Past Medical History Notes: fatty liver Endocrine Medical History: Positive for: Type II Diabetes Negative for: Type I Diabetes Time with diabetes: 10 yrs Treated with: Insulin Blood sugar tested every day: Yes Tested : 2-3 times a week Blood sugar testing results: Breakfast: 100; Lunch: 120 Genitourinary Medical History: Past Medical History Notes: CKD stage 3 Immunological Medical History: Negative for: Lupus Erythematosus; Raynauds; Scleroderma Integumentary (Skin) Medical History: Negative for: History of Burn Musculoskeletal Medical History: Positive for: Osteoarthritis; Osteomyelitis - left and right foot Negative for: Gout; Rheumatoid Arthritis Neurologic Medical History: Positive for: Neuropathy Negative for: Dementia; Quadriplegia; Paraplegia; Seizure Disorder Past Medical History Notes: cervical ruptured disc Oncologic Medical History: Negative for: Received Chemotherapy; Received Radiation Psychiatric Medical History: Negative for: Anorexia/bulimia; Confinement Anxiety HBO Extended History Items Eyes: Cataracts Immunizations Pneumococcal Vaccine: Received Pneumococcal Vaccination: No Implantable Devices None Hospitalization / Surgery History Type of Hospitalization/Surgery left heel surgery hysterectomy cholecystectomy right elbow surgery x4 December 2021 Family and Social History Cancer: No; Diabetes: Yes - Maternal Grandparents,Paternal Grandparents,Father,Siblings; Heart Disease: Yes - Mother,Father,Siblings; Hereditary Spherocytosis: No; Hypertension: Yes - Father; Kidney Disease: No; Lung Disease: No; Seizures: No; Stroke: No; Thyroid Problems: No; Tuberculosis: No; Never smoker; Marital Status - Married; Alcohol Use: Never; Drug Use: No History; Caffeine Use: Moderate - tea; Financial Concerns: No; Food, Clothing or Shelter Needs: No; Support System  Lacking: No; Transportation Concerns: No Engineer, maintenance) Signed: 03/31/2022 4:57:28 PM By: Fredirick Maudlin MD FACS Signed: 04/03/2022 5:13:49 PM By: Baruch Gouty RN, BSN Entered By: Fredirick Maudlin on 03/31/2022 16:39:09 -------------------------------------------------------------------------------- SuperBill Details Patient Name: Date of Service: [name], [name] 03/31/2022 Medical Record Number: [id number] Patient Account Number: [id number] Date of Birth/Sex: Treating RN: 02-18-1956 (66 y.o. [name] [name] Primary Care Provider: [name] Other Clinician: Referring Provider: Treating Provider/Extender: Maxwell Marion in Treatment: 17 Diagnosis Coding ICD-10 Codes Code Description E11.621 Type 2 diabetes mellitus with foot ulcer L97.514 Non-pressure chronic ulcer of other part of right foot with necrosis of bone L97.524 Non-pressure chronic ulcer of other part of left foot with necrosis of bone M86.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, right ankle and foot M86.672 Other chronic osteomyelitis, left ankle and foot Facility Procedures CPT4 Code: [id number] Description: 53202 - DEB SUBQ TISSUE 20 SQ CM/< ICD-10 Diagnosis Description L97.524 Non-pressure chronic ulcer of other part  of left foot with necrosis of bone Modifier: Quantity: 1 Physician Procedures : CPT4 Code Description Modifier [id number] - WC PHYS LEVEL 3 - EST PT 25 ICD-10 Diagnosis Description E11.621 Type 2 diabetes mellitus with foot ulcer L97.524 Non-pressure chronic ulcer of other part of left foot with necrosis of bone M86.672 Other  chronic osteomyelitis, left ankle and foot Quantity: 1 : [id number] 11042 - WC PHYS SUBQ TISS 20 SQ CM ICD-10 Diagnosis Description L97.524 Non-pressure chronic ulcer of other part of left foot with necrosis of bone Quantity: 1 Electronic Signature(s) Signed: 03/31/2022 4:40:48 PM By: Fredirick Maudlin MD FACS Entered By: Fredirick Maudlin  on 03/31/2022 16:40:48

## 2022-04-03 NOTE — Progress Notes (Signed)
[name] [name] ([id number]) Visit Report for 03/31/2022 Arrival Information Details Patient Name: Date of Service: [name] [name] RE, [name] 03/31/2022 3:30 PM Medical Record Number: [id number] Patient Account Number: [id number] Date of Birth/Sex: Treating RN: Nov 24, 1955 (66 y.o. [name] [name] Primary Care [name] [name]: [name] [name] Other Clinician: Donavan [name] Referring Teghan [name]: Treating [name] [name]/Extender: [name] [name] in Treatment: 17 Visit Information History Since Last Visit All ordered tests and consults were completed: Yes Patient Arrived: Wheel Chair Added or deleted any medications: No Arrival Time: 15:25 Any new allergies or adverse reactions: No Accompanied By: spouse Had a fall or experienced change in No Transfer Assistance: None activities of daily living that may affect Patient Identification Verified: Yes risk of falls: Secondary Verification Process Completed: Yes Signs or symptoms of abuse/neglect since last visito No Patient Requires Transmission-Based Precautions: No Hospitalized since last visit: No Patient Has Alerts: No Implantable device outside of the clinic excluding No cellular tissue based products placed in the center since last visit: Pain Present Now: Yes Electronic Signature(s) Signed: 04/03/2022 11:16:55 AM By: [name] [name] CHT EMT BS , , Entered By: [name] [name] on 03/31/2022 15:34:53 -------------------------------------------------------------------------------- Encounter Discharge Information Details Patient Name: Date of Service: [name] [name], [name] 03/31/2022 3:30 PM Medical Record Number: [id number] Patient Account Number: [id number] Date of Birth/Sex: Treating RN: 12/23/1955 (66 y.o. [name] [name] Primary Care [name] [name]: [name] [name] Other Clinician: Referring [name] [name]: Treating Tanish Sinkler/Extender: [name] [name] in Treatment: 17 Encounter Discharge  Information Items Post Procedure Vitals Discharge Condition: Stable Temperature (F): 98.7 Ambulatory Status: Wheelchair Pulse (bpm): 73 Discharge Destination: Home Respiratory Rate (breaths/min): 18 Transportation: Private Auto Blood Pressure (mmHg): 164/75 Accompanied By: husband Schedule Follow-up Appointment: Yes Clinical Summary of Care: Patient Declined Electronic Signature(s) Signed: 03/31/2022 5:25:42 PM By: [name] Hurst RN, BSN Entered By: [name] [name] on 03/31/2022 17:22:16 -------------------------------------------------------------------------------- Lower Extremity Assessment Details Patient Name: Date of Service: [name] [name], [name] 03/31/2022 3:30 PM Medical Record Number: [id number] Patient Account Number: [id number] Date of Birth/Sex: Treating RN: 05/12/1956 (66 y.o. [name] [name] Primary Care [name] [name]: [name] [name] Other Clinician: Referring Tremaine Earwood: Treating [name] [name]/Extender: [name] [name] in Treatment: 17 Edema Assessment Assessed: Shirlyn Goltz: No] [name] [name]: No] Edema: [Left: Yes] [Right: Yes] Calf Left: Right: Point of Measurement: 27 cm From Medial Instep 33.9 cm 33.9 cm Ankle Left: Right: Point of Measurement: 9 cm From Medial Instep 21.7 cm 23 cm Vascular Assessment Pulses: Dorsalis Pedis Palpable: [Left:Yes] [Right:Yes] Electronic Signature(s) Signed: 04/03/2022 11:16:55 AM By: [name] [name] CHT EMT BS , , Signed: 04/03/2022 5:13:49 PM By: [name] Gouty RN, BSN Entered By: [name] [name] on 03/31/2022 15:44:03 -------------------------------------------------------------------------------- Multi Wound Chart Details Patient Name: Date of Service: [name] [name], [name] 03/31/2022 3:30 PM Medical Record Number: [id number] Patient Account Number: [id number] Date of Birth/Sex: Treating RN: Mar 21, 1956 (66 y.o. [name] [name] Primary Care Lashonta Pilling: [name] [name] Other Clinician: Referring  [name] [name]: Treating Darleny Sem/Extender: [name] [name] in Treatment: 17 Vital Signs Height(in): 58 Pulse(bpm): 41 Weight(lbs): 169 Blood Pressure(mmHg): 164/75 Body Mass Index(BMI): 31.9 Temperature(F): 98.7 Respiratory Rate(breaths/min): 18 Photos: [11:Left Calcaneus] [N/A:N/A N/A] Wound Location: [11:Gradually Appeared] [N/A:N/A] Wounding Event: [11:Diabetic Wound/Ulcer of the Lower] [N/A:N/A] Primary Etiology: [11:Extremity Cataracts, Anemia, Sleep Apnea,] [N/A:N/A] Comorbid History: [11:Coronary Artery Disease, Hypertension, Myocardial Infarction, Type II Diabetes, Osteoarthritis, Osteomyelitis, Neuropathy 10/30/2020] [N/A:N/A] Date Acquired: [11:17] [N/A:N/A] Weeks of Treatment: [11:Open] [N/A:N/A] Wound Status: [11:No] [N/A:N/A] Wound Recurrence: [11:1.4x1.2x0.4] [N/A:N/A] Measurements L x W x  D (cm) [11:1.319] [N/A:N/A] A (cm) : rea [11:0.528] [N/A:N/A] Volume (cm) : [11:88.10%] [N/A:N/A] % Reduction in A [11:rea: 95.20%] [N/A:N/A] % Reduction in Volume: [11:Grade 3] [N/A:N/A] Classification: [11:Medium] [N/A:N/A] Exudate A mount: [11:Serosanguineous] [N/A:N/A] Exudate Type: [11:red, brown] [N/A:N/A] Exudate Color: [11:Flat and Intact] [N/A:N/A] Wound Margin: [11:Medium (34-66%)] [N/A:N/A] Granulation A mount: [11:Red] [N/A:N/A] Granulation Quality: [11:Medium (34-66%)] [N/A:N/A] Necrotic A mount: [11:Fat Layer (Subcutaneous Tissue): Yes N/A] Exposed Structures: [11:Fascia: No Tendon: No Muscle: No Joint: No Bone: No Small (1-33%)] [N/A:N/A] Epithelialization: [11:Debridement - Excisional] [N/A:N/A] Debridement: Pre-procedure Verification/Time Out 16:10 [N/A:N/A] Taken: [11:Callus, Subcutaneous, Slough] [N/A:N/A] Tissue Debrided: [11:Skin/Subcutaneous Tissue] [N/A:N/A] Level: [11:1.68] [N/A:N/A] Debridement A (sq cm): [11:rea Curette] [N/A:N/A] Instrument: [11:Minimum] [N/A:N/A] Bleeding: [11:Pressure] [N/A:N/A] Hemostasis A chieved:  [11:0] [N/A:N/A] Procedural Pain: [11:0] [N/A:N/A] Post Procedural Pain: [11:Procedure was tolerated well] [N/A:N/A] Debridement Treatment Response: [11:1.4x1.2x0.4] [N/A:N/A] Post Debridement Measurements L x W x D (cm) [11:0.528] [N/A:N/A] Post Debridement Volume: (cm) [11:Debridement] [N/A:N/A] Treatment Notes Electronic Signature(s) Signed: 03/31/2022 4:38:06 PM By: Fredirick Maudlin MD FACS Signed: 04/03/2022 5:13:49 PM By: [name] Gouty RN, BSN Entered By: Fredirick Maudlin on 03/31/2022 16:38:06 -------------------------------------------------------------------------------- Multi-Disciplinary Care Plan Details Patient Name: Date of Service: [name] [name], [name] 03/31/2022 3:30 PM Medical Record Number: [id number] Patient Account Number: [id number] Date of Birth/Sex: Treating RN: 1956-07-18 (66 y.o. [name] [name] Primary Care Solene Hereford: [name] [name] Other Clinician: Referring Penny Frisbie: Treating Nikoloz Huy/Extender: [name] [name] in Treatment: [place] reviewed with physician Active Inactive Nutrition Nursing Diagnoses: Impaired glucose control: actual or potential Potential for alteratiion in Nutrition/Potential for imbalanced nutrition Goals: Patient/caregiver agrees to and verbalizes understanding of need to use nutritional supplements and/or vitamins as prescribed Date Initiated: 11/29/2021 Date Inactivated: 01/03/2022 Target Resolution Date: 12/30/2021 Goal Status: Met Patient/caregiver will maintain therapeutic glucose control Date Initiated: 11/29/2021 Target Resolution Date: 04/28/2022 Goal Status: Active Interventions: Assess HgA1c results as ordered upon admission and as needed Assess patient nutrition upon admission and as needed per policy Provide education on elevated blood sugars and impact on wound healing Provide education on nutrition Treatment Activities: Education provided on Nutrition :  11/29/2021 Notes: Wound/Skin Impairment Nursing Diagnoses: Impaired tissue integrity Knowledge deficit related to ulceration/compromised skin integrity Goals: Patient/caregiver will verbalize understanding of skin care regimen Date Initiated: 11/29/2021 Target Resolution Date: 04/28/2022 Goal Status: Active Interventions: Assess patient/caregiver ability to obtain necessary supplies Assess patient/caregiver ability to perform ulcer/skin care regimen upon admission and as needed Assess ulceration(s) every visit Provide education on ulcer and skin care Notes: Electronic Signature(s) Signed: 03/31/2022 5:25:42 PM By: [name] Hurst RN, BSN Entered By: [name] [name] on 03/31/2022 15:51:38 -------------------------------------------------------------------------------- Pain Assessment Details Patient Name: Date of Service: [name] [name], [name] 03/31/2022 3:30 PM Medical Record Number: [id number] Patient Account Number: [id number] Date of Birth/Sex: Treating RN: 17-Mar-1956 (66 y.o. [name] [name] Primary Care Kiwanna Spraker: [name] [name] Other Clinician: Referring Treasa Bradshaw: Treating Kinzlie Harney/Extender: [name] [name] in Treatment: 17 Active Problems Location of Pain Severity and Description of Pain Patient Has Paino No Site Locations Pain Management and Medication Current Pain Management: Electronic Signature(s) Signed: 03/31/2022 5:25:42 PM By: [name] Hurst RN, BSN Entered By: [name] [name] on 03/31/2022 15:50:25 -------------------------------------------------------------------------------- Patient/Caregiver Education Details Patient Name: Date of Service: [name] [name], [name] 6/2/2023andnbsp3:30 PM Medical Record Number: [id number] Patient Account Number: [id number] Date of Birth/Gender: Treating RN: [birth year] (66 y.o. [name] [name] Primary Care Physician: [name] [name] Other Clinician: Referring Physician: Treating  Physician/Extender: Kathrene Alu  Weeks in Treatment: 17 Education Assessment Education Provided To: Patient Education Topics Provided Wound/Skin Impairment: Methods: Explain/Verbal Responses: State content correctly [employer]) Signed: 03/31/2022 5:25:42 PM By: [name] Hurst RN, BSN Entered By: [name] [name] on 03/31/2022 15:51:52 -------------------------------------------------------------------------------- Wound Assessment Details Patient Name: Date of Service: [name] [name], [name] 03/31/2022 3:30 PM Medical Record Number: [id number] Patient Account Number: [id number] Date of Birth/Sex: Treating RN: Jun 29, 1956 (66 y.o. [name] [name] Primary Care Journee Kohen: [name] [name] Other Clinician: Referring Christobal Morado: Treating Allon Costlow/Extender: [name] [name] in Treatment: 17 Wound Status Wound Number: 11 Primary Diabetic Wound/Ulcer of the Lower Extremity Etiology: Wound Location: Left Calcaneus Wound Open Wounding Event: Gradually Appeared Status: Date Acquired: 10/30/2020 Comorbid Cataracts, Anemia, Sleep Apnea, Coronary Artery Disease, Weeks Of Treatment: 17 History: Hypertension, Myocardial Infarction, Type II Diabetes, Clustered Wound: No Osteoarthritis, Osteomyelitis, Neuropathy Photos Wound Measurements Length: (cm) 1.4 Width: (cm) 1.2 Depth: (cm) 0.4 Area: (cm) 1.319 Volume: (cm) 0.528 % Reduction in Area: 88.1% % Reduction in Volume: 95.2% Epithelialization: Small (1-33%) Tunneling: No Undermining: No Wound Description Classification: Grade 3 Wound Margin: Flat and Intact Exudate Amount: Medium Exudate Type: Serosanguineous Exudate Color: red, brown Foul Odor After Cleansing: No Slough/Fibrino Yes Wound Bed Granulation Amount: Medium (34-66%) Exposed Structure Granulation Quality: Red Fascia Exposed: No Necrotic Amount: Medium (34-66%) Fat Layer (Subcutaneous Tissue) Exposed:  Yes Necrotic Quality: Adherent Slough Tendon Exposed: No Muscle Exposed: No Joint Exposed: No Bone Exposed: No Treatment Notes Wound #11 (Calcaneus) Wound Laterality: Left Cleanser Normal Saline Discharge Instruction: Cleanse the wound with Normal Saline prior to applying a clean dressing using gauze sponges, not tissue or cotton balls. Wound Cleanser Discharge Instruction: Cleanse the wound with wound cleanser prior to applying a clean dressing using gauze sponges, not tissue or cotton balls. Peri-Wound Care Zinc Oxide Ointment 30g tube Discharge Instruction: Apply Zinc Oxide to periwound with each dressing change Topical Primary Dressing KerraCel Ag Gelling Fiber Dressing, 4x5 in (silver alginate) Discharge Instruction: Apply silver alginate to wound bed as instructed Secondary Dressing ABD Pad, 5x9 Discharge Instruction: Apply over primary dressing as directed. Optifoam Non-Adhesive Dressing, 4x4 in Discharge Instruction: Apply over primary dressing to make foam donut Woven Gauze Sponge, Non-Sterile 4x4 in Discharge Instruction: Apply over primary dressing as directed. Secured With [employer], 4.5x3.1 (in/yd) Discharge Instruction: Secure with Kerlix as directed. 28M Medipore Soft Cloth Surgical T 2x10 (in/yd) ape Discharge Instruction: Secure with tape as directed. Compression Wrap Compression Stockings Add-Ons Electronic Signature(s) Signed: 03/31/2022 5:25:42 PM By: [name] Hurst RN, BSN Signed: 04/03/2022 5:13:49 PM By: [name] Gouty RN, BSN Entered By: [name] [name] on 03/31/2022 15:51:26 -------------------------------------------------------------------------------- Vitals Details Patient Name: Date of Service: [name] [name], [name] 03/31/2022 3:30 PM Medical Record Number: [id number] Patient Account Number: [id number] Date of Birth/Sex: Treating RN: 13-Sep-1956 (66 y.o. [name] [name] Primary Care Lovelace Cerveny: [name] [name] Other Clinician:  Donavan [name] Referring Jordanny Waddington: Treating Nahia Nissan/Extender: [name] [name] in Treatment: 17 Vital Signs Time Taken: 15:35 Temperature (F): 98.7 Height (in): 61 Pulse (bpm): 73 Weight (lbs): 169 Respiratory Rate (breaths/min): 18 Body Mass Index (BMI): 31.9 Blood Pressure (mmHg): 164/75 Reference Range: 80 - 120 mg / dl Electronic Signature(s) Signed: 04/03/2022 11:16:55 AM By: [name] [name] CHT EMT BS , , Entered By: [name] [name] on 03/31/2022 15:35:24

## 2022-04-04 ENCOUNTER — Encounter: Admit: 2022-04-04 | Discharge: 2022-04-04 | Payer: MEDICARE
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
## 2022-04-06 ENCOUNTER — Other Ambulatory Visit: Payer: Self-pay | Admitting: Family Medicine

## 2022-04-06 DIAGNOSIS — G894 Chronic pain syndrome: Secondary | ICD-10-CM

## 2022-04-06 DIAGNOSIS — M8610 Other acute osteomyelitis, unspecified site: Secondary | ICD-10-CM

## 2022-04-06 DIAGNOSIS — Z981 Arthrodesis status: Secondary | ICD-10-CM

## 2022-04-06 NOTE — Telephone Encounter (Signed)
Medication: HYDROcodone-acetaminophen (NORCO/VICODIN) 5-325 MG tablet   Has the patient contacted their pharmacy? No.  Preferred Pharmacy (with phone number or street name): [facility] [address] [place], [place] - [address]  [address], [place] [address]  Phone:  [phone]  Fax:  [phone]   Agent: Please be advised that RX refills may take up to 3 business days. We ask that you follow-up with your pharmacy.

## 2022-04-06 NOTE — Telephone Encounter (Signed)
Requesting: NORCO Contract: 02/14/2022 UDS: 02/14/2022 Last OV: 02/14/2022 Next OV: N/A Last Refill: 03/06/2022, #120--0 RF Database:   Please advise

## 2022-04-07 MED ORDER — HYDROCODONE-ACETAMINOPHEN 5-325 MG PO TABS: 1.0000 | ORAL_TABLET | Freq: Four times a day (QID) | ORAL | 0 refills | Status: DC | PRN

## 2022-04-07 NOTE — Telephone Encounter (Signed)
Pt called for update. Advised pt awaiting provider response.

## 2022-04-12 ENCOUNTER — Inpatient Hospital Stay: Payer: PRIVATE HEALTH INSURANCE

## 2022-04-12 ENCOUNTER — Telehealth: Payer: Self-pay | Admitting: Family Medicine

## 2022-04-12 ENCOUNTER — Inpatient Hospital Stay: Payer: PRIVATE HEALTH INSURANCE | Attending: Hematology & Oncology

## 2022-04-12 NOTE — Telephone Encounter (Signed)
Pt called stating that she has severe abdominal pain that spans to her back. Pt rated pain level at an 8/10. Pt was transferred to triage nurse for further eval.

## 2022-04-12 NOTE — Telephone Encounter (Signed)
Nurse Assessment Nurse: Ottis Stain, RN, Sherrie Date/Time (Eastern Time): 04/12/2022 10:46:26 AM Confirm and document reason for call. If symptomatic, describe symptoms. ---Caller states having abdominal pain below belly button on the left and right sides. States pain comes and goes. Pain 8/10. Started over the weekend. No vomiting or diarrhea. No fever. +fluids, +UOP in last 8 hrs. Does the patient have any new or worsening symptoms? ---Yes Will a triage be completed? ---Yes Related visit to physician within the last 2 weeks? ---No Does the PT have any chronic conditions? (i.e. diabetes, asthma, this includes High risk factors for pregnancy, etc.) ---Yes List chronic conditions. ---HTN, Type Diabetic - Insulin, Asthma. Is this a behavioral health or substance abuse call? ---No Guidelines Guideline Title Affirmed Question Affirmed Notes Nurse Date/Time Eilene Ghazi Time) Abdominal Pain - Female [1] SEVERE pain AND [2] age > 36 years Ottis Stain, Rainbow, Dondra Spry 04/12/2022 10:49:57 AM Disp. Time Eilene Ghazi Time) Disposition Final User 04/12/2022 10:45:20 AM Send to Urgent Queue Gwenlyn Perking NOTE: All timestamps contained within this report are represented as [place] Standard Time. CONFIDENTIALTY NOTICE: This fax transmission is intended only for the addressee. It contains information that is legally privileged, confidential or otherwise protected from use or disclosure. If you are not the intended recipient, you are strictly prohibited from reviewing, disclosing, copying using or disseminating any of this information or taking any action in reliance on or regarding this information. If you have received this fax in error, please notify us immediately by telephone so that we can arrange for its return to [name]. Phone: [phone], Toll-Free: [phone], Fax: [phone] Page: 2 of 2 Call Id: [id number] 04/12/2022 10:51:39 AM Go to ED Now Yes Ottis Stain, RN, Sherrie Caller Disagree/Comply  Comply Caller Understands Yes PreDisposition InappropriateToAsk Care Advice Given Per Guideline GO TO ED NOW: * You need to be seen in the Emergency Department. ANOTHER ADULT SHOULD DRIVE: * It is better and safer if another adult drives instead of you. NOTHING BY MOUTH: * Do not eat or drink anything for now. Comments User: Evlyn Clines, RN Date/Time (Eastern Time): 04/12/2022 10:52:46 AM Last BM 2 days ago and +gas. Referrals [facility] [place] - ED

## 2022-04-13 NOTE — Telephone Encounter (Signed)
Pt called. LVM to go to the ED

## 2022-04-14 ENCOUNTER — Encounter (HOSPITAL_BASED_OUTPATIENT_CLINIC_OR_DEPARTMENT_OTHER): Payer: PRIVATE HEALTH INSURANCE | Admitting: General Surgery

## 2022-04-18 ENCOUNTER — Telehealth: Payer: Self-pay

## 2022-04-18 NOTE — Telephone Encounter (Signed)
Patient calling to request that we schedule port/cath removal. Advised her to contact IR direct as we have scheduled this multiple times and patient does not go. Orders are in system. Patient had no pen and asked that we give husband the number when he calls back. Husband called and was given [phone] and advised to schedule removal direct with IR. Husband understood.

## 2022-04-20 ENCOUNTER — Ambulatory Visit (INDEPENDENT_AMBULATORY_CARE_PROVIDER_SITE_OTHER): Payer: PRIVATE HEALTH INSURANCE | Admitting: Family Medicine

## 2022-04-20 ENCOUNTER — Ambulatory Visit (HOSPITAL_BASED_OUTPATIENT_CLINIC_OR_DEPARTMENT_OTHER)
Admission: RE | Admit: 2022-04-20 | Discharge: 2022-04-20 | Disposition: A | Discharge status: Home / Self Care | Payer: PRIVATE HEALTH INSURANCE | Source: Ambulatory Visit | Attending: Family Medicine | Admitting: Family Medicine

## 2022-04-20 VITALS — BP 157/79 | HR 70 | Temp 97.9°F | Resp 18

## 2022-04-20 DIAGNOSIS — K5900 Constipation, unspecified: Secondary | ICD-10-CM | POA: Diagnosis present

## 2022-04-20 NOTE — Patient Instructions (Addendum)
Review instructions below. Abdominal xray today. Monitor for any new symptoms. Follow-up with PCP in 2 weeks to reassess. Follow-up sooner if needed.    CONSTIPATION:  ALL THE TIME:  Lifestyle measures Hydration: drink plenty of water Physical activity: at least walking daily High-fiber foods Bulk forming laxatives  Psyllium (Konsyl; Metamucil; Perdiem) Methylcellulose (Citrucel) Calcium polycarbophil (FiberCon; Fiber-Lax; Mitrolan) Wheat dextrin (Benefiber)  AS NEEDED FOR 3-5 DAYS AT A TIME OR ALL THE TIME 1-2 TIMES PER WEEK  (CAN TAKE ONE FROM EACH CATEGORY E.G. MIRALAX + SENNA) Hyperosmolar or saline laxatives: Polyethylene glycol (MiraLAX, GlycoLax) Lactulose Sorbitol Magnesium hydroxide (Milk of Magnesia)  Magnesium citrate (Evac-Q-Mag)  Stimulant laxatives Senna (eg, Black Draught, Ex-Lax, Fletcher's, Castoria, Senokot)  Bisacodyl (eg, Correctol, Doxidan, Dulcolax). Taking stimulant laxatives regularly or in large amounts can cause side effects, including low potassium levels. Thus, you should take these drugs carefully if you must use them regularly.  PRESCRIPTIONS that treat severe constipation. They are expensive, but may be recommended if you do not respond to other treatments. Lubiprostone (Amitiza) Linaclotide (Linzess) Plecanatide (Trulance, Motegrity) Tenapanor Orpah Cobb)

## 2022-04-20 NOTE — Progress Notes (Unsigned)
Acute Office Visit  Subjective:     [name], adult    DOB: 02/20/1956, 66 y.o.   MRN: [id number]  Chief Complaint  Patient presents with   Abdominal Pain    And back pain. She says she has an appointment with her surgeon (she has had a back surgery) in July. She has Bms with [employer].      Abdominal Pain   Patient is in today for constipation and abd pain.   ***  Can only go if she uses an enema Has been struggling with constipation for the past 3 weeks About 3-4 weeks ago, felt like something "broke" in her stomach and had a floating feeling from LUQ across abdomen and since then she has been constipated and had back pain - hx of back surgery with hardware (2021). She is scheduled to follow-up with back surgeon July 3rd.   She is going about 5 days between bowel movements now; she has tried everything OTC without improvement. Only thing that helps is enemas Sometimes will have abd pain when it has been several days btwn bms, but then is feeling fine after a bm; stools are hard/formed "like a rock" No blood in stools No vomiting , does have some baseline nausea with ehr fibromyalgia, but nothing more than baseline No fevers  Last BM was last night with an enema Eating regular, high fiber, staying well hydrated (6 bottles of water per day at least)   She is scared to have a colonoscopy bc friend died after one 20 years ago. She will think about it.         ROS All review of systems negative except what is listed in the HPI       Objective:    BP (!) 157/79   Pulse 70   Temp 97.9 F (36.6 C) (Oral)   Resp 18   SpO2 98%  {Vitals History (Optional):23777}  Physical Exam Vitals reviewed.  Constitutional:      Appearance: Normal appearance. She is obese.  Cardiovascular:     Rate and Rhythm: Normal rate and regular rhythm.  Pulmonary:     Effort: Pulmonary effort is normal.     Breath sounds: Normal breath sounds.  Abdominal:      General: Abdomen is flat. Bowel sounds are normal. There is no distension.     Palpations: Abdomen is soft. There is no mass.     Tenderness: There is no abdominal tenderness. There is no guarding or rebound.     Comments: Assessment limited by wheelchair use  Neurological:     General: No focal deficit present.     Mental Status: She is alert and oriented to person, place, and time. Mental status is at baseline.  Psychiatric:        Mood and Affect: Mood normal.        Behavior: Behavior normal.        Thought Content: Thought content normal.        Judgment: Judgment normal.     No results found for any visits on 04/20/22.      Assessment & Plan:   1. Constipation, unspecified constipation type Review Constipation instructions on AVS - use of probiotics, fiber supplements, MiraLax, etc. Abdominal xray today. Monitor for any new symptoms. Follow-up with PCP in 2 weeks to reassess. Follow-up sooner if needed.   - DG Abd 1 View; Future   Return for PCP 2 week f/u .  [facility]  Olevia Bowens, NP

## 2022-04-21 ENCOUNTER — Encounter: Payer: Self-pay | Admitting: Family Medicine

## 2022-04-21 ENCOUNTER — Encounter: Admit: 2022-04-21 | Discharge: 2022-04-21 | Payer: MEDICARE

## 2022-04-21 DIAGNOSIS — K5900 Constipation, unspecified: Secondary | ICD-10-CM

## 2022-04-21 DIAGNOSIS — G5600 Carpal tunnel syndrome, unspecified upper limb: Secondary | ICD-10-CM

## 2022-04-21 DIAGNOSIS — E559 Vitamin D deficiency, unspecified: Secondary | ICD-10-CM

## 2022-04-21 DIAGNOSIS — K589 Irritable bowel syndrome without diarrhea: Secondary | ICD-10-CM

## 2022-04-21 DIAGNOSIS — J4520 Mild intermittent asthma, uncomplicated: Secondary | ICD-10-CM

## 2022-04-21 DIAGNOSIS — M79642 Pain in left hand: Secondary | ICD-10-CM

## 2022-04-21 DIAGNOSIS — G9050 Complex regional pain syndrome I, unspecified: Secondary | ICD-10-CM

## 2022-04-21 DIAGNOSIS — R0981 Nasal congestion: Secondary | ICD-10-CM

## 2022-04-21 DIAGNOSIS — D509 Iron deficiency anemia, unspecified: Secondary | ICD-10-CM

## 2022-04-21 DIAGNOSIS — J45909 Unspecified asthma, uncomplicated: Secondary | ICD-10-CM

## 2022-04-21 DIAGNOSIS — I10 Essential (primary) hypertension: Secondary | ICD-10-CM

## 2022-04-21 DIAGNOSIS — E119 Type 2 diabetes mellitus without complications: Secondary | ICD-10-CM

## 2022-04-21 DIAGNOSIS — J309 Allergic rhinitis, unspecified: Secondary | ICD-10-CM

## 2022-04-21 DIAGNOSIS — R062 Wheezing: Secondary | ICD-10-CM

## 2022-04-21 DIAGNOSIS — K2970 Gastritis, unspecified, without bleeding: Secondary | ICD-10-CM

## 2022-04-21 DIAGNOSIS — E785 Hyperlipidemia, unspecified: Secondary | ICD-10-CM

## 2022-04-21 DIAGNOSIS — D649 Anemia, unspecified: Secondary | ICD-10-CM

## 2022-04-21 LAB — URINALYSIS DIPSTICK
NITRITE: NEGATIVE
URINE ASCORBIC ACID, UA: NEGATIVE
URINE BILE: NEGATIVE
URINE BLOOD: NEGATIVE
URINE KETONE: NEGATIVE
URINE SPEC GRAVITY: 1 mg/dL (ref 1.005–1.030)

## 2022-04-21 LAB — BASIC METABOLIC PANEL
CO2: 29 MMOL/L — ABNORMAL HIGH (ref 21–30)
GLUCOSE,PANEL: 172 mg/dL — ABNORMAL HIGH (ref 70–100)
SODIUM: 139 MMOL/L (ref 137–147)

## 2022-04-21 LAB — CBC
HEMATOCRIT: 33 % — ABNORMAL LOW (ref 36–45)
MCH: 27 pg (ref 26–34)
MPV: 8.1 FL (ref 7–11)
PLATELET COUNT: 319 K/UL — ABNORMAL HIGH (ref <0.15)
RBC COUNT: 4.1 M/UL (ref 4.0–5.0)
WBC COUNT: 6.8 K/UL (ref 4.5–11.0)

## 2022-04-21 LAB — PROTEIN/CR RATIO,UR RAN: UR TOTAL PROTEIN,RAN: 14 mg/dL — ABNORMAL HIGH (ref 0–5)

## 2022-04-21 MED ORDER — BUDESONIDE-FORMOTEROL 160-4.5 MCG/ACTUATION IN HFAA
2 | Freq: Two times a day (BID) | RESPIRATORY_TRACT | 3 refills | 30.00000 days | Status: AC
Start: 2022-04-21 — End: ?

## 2022-04-21 MED ORDER — AZELASTINE 137 MCG (0.1 %) NA SPRA
2 | Freq: Two times a day (BID) | NASAL | 3 refills | 50.00000 days | Status: AC
Start: 2022-04-21 — End: ?
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
## 2022-04-22 ENCOUNTER — Ambulatory Visit: Admit: 2022-04-21 | Discharge: 2022-04-22 | Payer: MEDICARE

## 2022-04-22 DIAGNOSIS — E559 Vitamin D deficiency, unspecified: Secondary | ICD-10-CM

## 2022-04-22 DIAGNOSIS — N1832 Chronic kidney disease, stage 3b (HCC): Secondary | ICD-10-CM

## 2022-04-24 ENCOUNTER — Encounter: Payer: PRIVATE HEALTH INSURANCE | Admitting: Dietician

## 2022-04-24 ENCOUNTER — Encounter: Admit: 2022-04-24 | Discharge: 2022-04-24 | Payer: MEDICARE

## 2022-04-25 ENCOUNTER — Encounter: Payer: Self-pay | Admitting: *Deleted

## 2022-04-25 ENCOUNTER — Ambulatory Visit (HOSPITAL_COMMUNITY)
Admission: RE | Admit: 2022-04-25 | Discharge: 2022-04-25 | Disposition: A | Discharge status: Home / Self Care | Payer: PRIVATE HEALTH INSURANCE | Source: Ambulatory Visit | Attending: Internal Medicine | Admitting: Internal Medicine

## 2022-04-25 DIAGNOSIS — Z452 Encounter for adjustment and management of vascular access device: Secondary | ICD-10-CM | POA: Diagnosis present

## 2022-04-25 DIAGNOSIS — N185 Chronic kidney disease, stage 5: Secondary | ICD-10-CM | POA: Insufficient documentation

## 2022-04-25 DIAGNOSIS — D631 Anemia in chronic kidney disease: Secondary | ICD-10-CM | POA: Diagnosis not present

## 2022-04-25 HISTORY — PX: IR REMOVAL TUN CV CATH W/O FL: IMG2289

## 2022-04-25 MED ORDER — LIDOCAINE HCL 1 % IJ SOLN
INTRAMUSCULAR | Status: AC
  Filled 2022-04-25: qty 20

## 2022-04-25 MED ORDER — LIDOCAINE HCL (PF) 1 % IJ SOLN
INTRAMUSCULAR | Status: DC | PRN
  Administered 2022-04-25: 5 mL

## 2022-04-25 NOTE — Procedures (Signed)
Interventional Radiology Procedure Note  PROCEDURE SUMMARY:  During removal of tunneled catheter cuff separated from catheter and remained under the skin.   The cuff was palpated just above the exit site of the catheter.  1% lidocaine was injected around the cuff of the catheter .  Using a #11 blade, Corrin Parker, PA made small incision over the cuff. The catheter was dissected out using scissors and curved hemostats until the cuff was freed from the surrounding fibrous sheath and the catheter was removed without difficulty.   Next a 4-0 Monosof suture was used to approximate on the skin only at the incision site. Pressure was used to achieve hemostasis. A clean dry gauze and Tegaderm was placed.    EBL <5 cc  Please see full dictation in imaging section of Epic for procedure details.    Alex Gardener, AGNP-BC 04/25/2022, 1:12 PM

## 2022-04-28 ENCOUNTER — Other Ambulatory Visit: Payer: Self-pay | Admitting: Family Medicine

## 2022-04-28 DIAGNOSIS — G894 Chronic pain syndrome: Secondary | ICD-10-CM

## 2022-04-28 DIAGNOSIS — Z981 Arthrodesis status: Secondary | ICD-10-CM

## 2022-04-29 ENCOUNTER — Other Ambulatory Visit: Payer: Self-pay | Admitting: Family Medicine

## 2022-04-29 DIAGNOSIS — R110 Nausea: Secondary | ICD-10-CM

## 2022-05-01 ENCOUNTER — Ambulatory Visit: Payer: PRIVATE HEALTH INSURANCE | Admitting: Dietician

## 2022-05-03 ENCOUNTER — Inpatient Hospital Stay: Payer: PRIVATE HEALTH INSURANCE

## 2022-05-03 ENCOUNTER — Inpatient Hospital Stay: Payer: PRIVATE HEALTH INSURANCE | Attending: Hematology & Oncology

## 2022-05-03 DIAGNOSIS — N183 Chronic kidney disease, stage 3 unspecified: Secondary | ICD-10-CM | POA: Insufficient documentation

## 2022-05-03 DIAGNOSIS — D631 Anemia in chronic kidney disease: Secondary | ICD-10-CM | POA: Insufficient documentation

## 2022-05-04 ENCOUNTER — Telehealth: Payer: Self-pay | Admitting: *Deleted

## 2022-05-04 ENCOUNTER — Other Ambulatory Visit: Payer: Self-pay | Admitting: Neurosurgery

## 2022-05-04 ENCOUNTER — Other Ambulatory Visit (HOSPITAL_COMMUNITY): Payer: Self-pay | Admitting: Neurosurgery

## 2022-05-04 ENCOUNTER — Other Ambulatory Visit: Payer: Self-pay | Admitting: Orthopedic Surgery

## 2022-05-04 ENCOUNTER — Ambulatory Visit (INDEPENDENT_AMBULATORY_CARE_PROVIDER_SITE_OTHER): Payer: PRIVATE HEALTH INSURANCE | Admitting: Family Medicine

## 2022-05-04 ENCOUNTER — Encounter: Payer: Self-pay | Admitting: Family Medicine

## 2022-05-04 VITALS — BP 118/80 | HR 61 | Temp 98.1°F | Resp 18 | Ht 61.0 in

## 2022-05-04 DIAGNOSIS — E785 Hyperlipidemia, unspecified: Secondary | ICD-10-CM

## 2022-05-04 DIAGNOSIS — K5900 Constipation, unspecified: Secondary | ICD-10-CM

## 2022-05-04 DIAGNOSIS — M8610 Other acute osteomyelitis, unspecified site: Secondary | ICD-10-CM | POA: Diagnosis not present

## 2022-05-04 DIAGNOSIS — M549 Dorsalgia, unspecified: Secondary | ICD-10-CM | POA: Diagnosis not present

## 2022-05-04 DIAGNOSIS — E1165 Type 2 diabetes mellitus with hyperglycemia: Secondary | ICD-10-CM

## 2022-05-04 DIAGNOSIS — Z981 Arthrodesis status: Secondary | ICD-10-CM

## 2022-05-04 DIAGNOSIS — K5903 Drug induced constipation: Secondary | ICD-10-CM

## 2022-05-04 DIAGNOSIS — I2510 Atherosclerotic heart disease of native coronary artery without angina pectoris: Secondary | ICD-10-CM

## 2022-05-04 DIAGNOSIS — G894 Chronic pain syndrome: Secondary | ICD-10-CM

## 2022-05-04 DIAGNOSIS — T402X5A Adverse effect of other opioids, initial encounter: Secondary | ICD-10-CM

## 2022-05-04 LAB — CBC WITH DIFFERENTIAL/PLATELET
Basophils Absolute: 0.1 10*3/uL (ref 0.0–0.1)
Basophils Relative: 0.8 % (ref 0.0–3.0)
Eosinophils Absolute: 0.2 10*3/uL (ref 0.0–0.7)
Eosinophils Relative: 2.8 % (ref 0.0–5.0)
HCT: 28.5 % — ABNORMAL LOW (ref 36.0–46.0)
Hemoglobin: 9.3 g/dL — ABNORMAL LOW (ref 12.0–15.0)
Lymphocytes Relative: 9.8 % — ABNORMAL LOW (ref 12.0–46.0)
Lymphs Abs: 0.7 10*3/uL (ref 0.7–4.0)
MCHC: 32.7 g/dL (ref 30.0–36.0)
MCV: 85.3 fl (ref 78.0–100.0)
Monocytes Absolute: 0.4 10*3/uL (ref 0.1–1.0)
Monocytes Relative: 4.9 % (ref 3.0–12.0)
Neutro Abs: 6.1 10*3/uL (ref 1.4–7.7)
Neutrophils Relative %: 81.7 % — ABNORMAL HIGH (ref 43.0–77.0)
Platelets: 232 10*3/uL (ref 150.0–400.0)
RBC: 3.34 Mil/uL — ABNORMAL LOW (ref 3.87–5.11)
RDW: 16.5 % — ABNORMAL HIGH (ref 11.5–15.5)
WBC: 7.5 10*3/uL (ref 4.0–10.5)

## 2022-05-04 LAB — COMPREHENSIVE METABOLIC PANEL
ALT: 5 U/L (ref 0–35)
AST: 10 U/L (ref 0–37)
Albumin: 3.5 g/dL (ref 3.5–5.2)
Alkaline Phosphatase: 96 U/L (ref 39–117)
BUN: 57 mg/dL — ABNORMAL HIGH (ref 6–23)
CO2: 22 mEq/L (ref 19–32)
Calcium: 8.7 mg/dL (ref 8.4–10.5)
Chloride: 106 mEq/L (ref 96–112)
Creatinine, Ser: 3.6 mg/dL — ABNORMAL HIGH (ref 0.40–1.20)
GFR: 12.64 mL/min — CL (ref >60.00)
Glucose, Bld: 97 mg/dL (ref 70–99)
Potassium: 5.7 mEq/L — ABNORMAL HIGH (ref 3.5–5.1)
Sodium: 135 mEq/L (ref 135–145)
Total Bilirubin: 0.4 mg/dL (ref 0.2–1.2)
Total Protein: 7.7 g/dL (ref 6.0–8.3)

## 2022-05-04 LAB — LIPID PANEL
Cholesterol: 136 mg/dL (ref 0–200)
HDL: 28.6 mg/dL — ABNORMAL LOW (ref >39.00)
LDL Cholesterol: 78 mg/dL (ref 0–99)
NonHDL: 107.43
Total CHOL/HDL Ratio: 5
Triglycerides: 147 mg/dL (ref 0.0–149.0)
VLDL: 29.4 mg/dL (ref 0.0–40.0)

## 2022-05-04 LAB — SEDIMENTATION RATE: Sed Rate: 39 mm/hr — ABNORMAL HIGH (ref 0–30)

## 2022-05-04 LAB — HIGH SENSITIVITY CRP: CRP, High Sensitivity: 22.28 mg/L — ABNORMAL HIGH (ref 0.000–5.000)

## 2022-05-04 MED ORDER — LUBIPROSTONE 24 MCG PO CAPS: 24.0000 ug | ORAL_CAPSULE | Freq: Two times a day (BID) | ORAL | 5 refills | Status: DC

## 2022-05-04 MED ORDER — HYDROCODONE-ACETAMINOPHEN 5-325 MG PO TABS: 1.0000 | ORAL_TABLET | Freq: Four times a day (QID) | ORAL | 0 refills | Status: DC | PRN

## 2022-05-04 NOTE — Telephone Encounter (Signed)
CRITICAL VALUE STICKER  CRITICAL VALUE:  GFR -- 12.64  RECEIVER (on-site recipient of call):  Silas Flood, [place] NOTIFIED: 05/04/22 @ 4:44  MESSENGER (representative from lab): Roger Kill  MD NOTIFIED: Carollee Herter  TIME OF NOTIFICATION: 4:46  RESPONSE:

## 2022-05-04 NOTE — Patient Instructions (Signed)
Constipation, Adult Constipation is when a person has fewer than three bowel movements in a week, has difficulty having a bowel movement, or has stools (feces) that are dry, hard, or larger than normal. Constipation may be caused by an underlying condition. It may become worse with age if a person takes certain medicines and does not take in enough fluids. Follow these instructions at home: Eating and drinking  Eat foods that have a lot of fiber, such as beans, whole grains, and fresh fruits and vegetables. Limit foods that are low in fiber and high in fat and processed sugars, such as fried or sweet foods. These include french fries, hamburgers, cookies, candies, and soda. Drink enough fluid to keep your urine pale yellow. General instructions Exercise regularly or as told by your health care provider. Try to do 150 minutes of moderate exercise each week. Use the bathroom when you have the urge to go. Do not hold it in. Take over-the-counter and prescription medicines only as told by your health care provider. This includes any fiber supplements. During bowel movements: Practice deep breathing while relaxing the lower abdomen. Practice pelvic floor relaxation. Watch your condition for any changes. Let your health care provider know about them. Keep all follow-up visits as told by your health care provider. This is important. Contact a health care provider if: You have pain that gets worse. You have a fever. You do not have a bowel movement after 4 days. You vomit. You are not hungry or you lose weight. You are bleeding from the opening between the buttocks (anus). You have thin, pencil-like stools. Get help right away if: You have a fever and your symptoms suddenly get worse. You leak stool or have blood in your stool. Your abdomen is bloated. You have severe pain in your abdomen. You feel dizzy or you faint. Summary Constipation is when a person has fewer than three bowel movements  in a week, has difficulty having a bowel movement, or has stools (feces) that are dry, hard, or larger than normal. Eat foods that have a lot of fiber, such as beans, whole grains, and fresh fruits and vegetables. Drink enough fluid to keep your urine pale yellow. Take over-the-counter and prescription medicines only as told by your health care provider. This includes any fiber supplements. This information is not intended to replace advice given to you by your health care provider. Make sure you discuss any questions you have with your health care provider. Document Revised: 09/03/2019 Document Reviewed: 09/03/2019 Elsevier Patient Education  [employer].

## 2022-05-04 NOTE — Telephone Encounter (Signed)
All lab results from today (except Procalcitonin which is still pending) have been faxed from Epic to [facility].

## 2022-05-04 NOTE — Assessment & Plan Note (Signed)
amitiza per orders  Consider gi if no better
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
## 2022-05-09 ENCOUNTER — Encounter: Payer: Self-pay | Admitting: Family

## 2022-05-10 LAB — PROCALCITONIN: Procalcitonin: 0.15 ng/mL — ABNORMAL HIGH (ref <0.10)

## 2022-05-12 ENCOUNTER — Encounter (HOSPITAL_BASED_OUTPATIENT_CLINIC_OR_DEPARTMENT_OTHER): Payer: PRIVATE HEALTH INSURANCE | Attending: General Surgery | Admitting: Internal Medicine

## 2022-05-12 DIAGNOSIS — L97524 Non-pressure chronic ulcer of other part of left foot with necrosis of bone: Secondary | ICD-10-CM | POA: Diagnosis not present

## 2022-05-12 DIAGNOSIS — L97514 Non-pressure chronic ulcer of other part of right foot with necrosis of bone: Secondary | ICD-10-CM | POA: Diagnosis not present

## 2022-05-12 DIAGNOSIS — M86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, right ankle and foot: Secondary | ICD-10-CM | POA: Insufficient documentation

## 2022-05-12 DIAGNOSIS — M86672 Other chronic osteomyelitis, left ankle and foot: Secondary | ICD-10-CM | POA: Diagnosis not present

## 2022-05-12 DIAGNOSIS — E11621 Type 2 diabetes mellitus with foot ulcer: Secondary | ICD-10-CM | POA: Diagnosis not present

## 2022-05-13 ENCOUNTER — Ambulatory Visit (HOSPITAL_COMMUNITY)
Admission: RE | Admit: 2022-05-13 | Discharge: 2022-05-13 | Disposition: A | Discharge status: Home / Self Care | Payer: PRIVATE HEALTH INSURANCE | Source: Ambulatory Visit | Attending: Neurosurgery | Admitting: Neurosurgery

## 2022-05-13 DIAGNOSIS — M549 Dorsalgia, unspecified: Secondary | ICD-10-CM | POA: Diagnosis not present

## 2022-05-13 MED ORDER — GADOBUTROL 1 MMOL/ML IV SOLN
7.0000 mL | Freq: Once | INTRAVENOUS | Status: AC | PRN
  Administered 2022-05-13: 7 mL via INTRAVENOUS

## 2022-05-15 NOTE — Telephone Encounter (Signed)
[facility] called stating all the labs from 07/06  need to be faxed to them to [phone]. Please advise.

## 2022-05-16 ENCOUNTER — Inpatient Hospital Stay: Payer: PRIVATE HEALTH INSURANCE

## 2022-05-16 VITALS — BP 167/72 | HR 70 | Temp 98.0°F | Resp 18

## 2022-05-16 DIAGNOSIS — N183 Chronic kidney disease, stage 3 unspecified: Secondary | ICD-10-CM

## 2022-05-16 DIAGNOSIS — D631 Anemia in chronic kidney disease: Secondary | ICD-10-CM | POA: Diagnosis present

## 2022-05-16 DIAGNOSIS — D500 Iron deficiency anemia secondary to blood loss (chronic): Secondary | ICD-10-CM

## 2022-05-16 DIAGNOSIS — D508 Other iron deficiency anemias: Secondary | ICD-10-CM

## 2022-05-16 LAB — CBC WITH DIFFERENTIAL (CANCER CENTER ONLY)
Abs Immature Granulocytes: 0.09 10*3/uL — ABNORMAL HIGH (ref 0.00–0.07)
Basophils Absolute: 0 10*3/uL (ref 0.0–0.1)
Basophils Relative: 0 %
Eosinophils Absolute: 0.2 10*3/uL (ref 0.0–0.5)
Eosinophils Relative: 3 %
HCT: 28.2 % — ABNORMAL LOW (ref 36.0–46.0)
Hemoglobin: 8.4 g/dL — ABNORMAL LOW (ref 12.0–15.0)
Immature Granulocytes: 1 %
Lymphocytes Relative: 13 %
Lymphs Abs: 0.9 10*3/uL (ref 0.7–4.0)
MCH: 27 pg (ref 26.0–34.0)
MCHC: 29.8 g/dL — ABNORMAL LOW (ref 30.0–36.0)
MCV: 90.7 fL (ref 80.0–100.0)
Monocytes Absolute: 0.5 10*3/uL (ref 0.1–1.0)
Monocytes Relative: 8 %
Neutro Abs: 5 10*3/uL (ref 1.7–7.7)
Neutrophils Relative %: 75 %
Platelet Count: 169 10*3/uL (ref 150–400)
RBC: 3.11 MIL/uL — ABNORMAL LOW (ref 3.87–5.11)
RDW: 16.1 % — ABNORMAL HIGH (ref 11.5–15.5)
WBC Count: 6.8 10*3/uL (ref 4.0–10.5)
nRBC: 0 % (ref 0.0–0.2)

## 2022-05-16 LAB — RETICULOCYTES
Immature Retic Fract: 13.1 % (ref 2.3–15.9)
RBC.: 3.04 MIL/uL — ABNORMAL LOW (ref 3.87–5.11)
Retic Count, Absolute: 59.3 10*3/uL (ref 19.0–186.0)
Retic Ct Pct: 2 % (ref 0.4–3.1)

## 2022-05-16 LAB — SAMPLE TO BLOOD BANK

## 2022-05-16 LAB — FERRITIN: Ferritin: 349 ng/mL — ABNORMAL HIGH (ref 11–307)

## 2022-05-16 MED ORDER — EPOETIN ALFA-EPBX 40000 UNIT/ML IJ SOLN
40000.0000 [IU] | Freq: Once | INTRAMUSCULAR | Status: AC
  Administered 2022-05-16: 40000 [IU] via SUBCUTANEOUS
  Filled 2022-05-16: qty 1

## 2022-05-16 NOTE — Progress Notes (Signed)
[name] [name], [name] [name] ([id number]) Visit Report for 05/12/2022 Arrival Information Details Patient Name: Date of Service: [name] [name] RE, [name] 05/12/2022 3:30 PM Medical Record Number: [id number] Patient Account Number: [id number] Date of Birth/Sex: Treating RN: Jun 09, 1956 (66 y.o. [name] [name], [name] Primary Care Jenisa Monty: [name] Other Clinician: Referring [name]: Treating [name]/Extender: [name] in Treatment: 23 Visit Information History Since Last Visit Added or deleted any medications: No Patient Arrived: Wheel Chair Any new allergies or adverse reactions: No Arrival Time: 15:46 Had a fall or experienced change in No Accompanied By: self activities of daily living that may affect Transfer Assistance: None risk of falls: Patient Identification Verified: Yes Signs or symptoms of abuse/neglect since last visito No Secondary Verification Process Completed: Yes Hospitalized since last visit: No Patient Requires Transmission-Based Precautions: No Implantable device outside of the clinic excluding No Patient Has Alerts: No cellular tissue based products placed in the center since last visit: Pain Present Now: Yes Notes MRI and pain r/t back. Electronic Signature(s) Signed: 05/12/2022 5:20:38 PM By: Deon Pilling RN, BSN Entered By: Deon Pilling on 05/12/2022 15:47:12 -------------------------------------------------------------------------------- Encounter Discharge Information Details Patient Name: Date of Service: [name], [name] 05/12/2022 3:30 PM Medical Record Number: [id number] Patient Account Number: [id number] Date of Birth/Sex: Treating RN: [birth year] (66 y.o. [name] [name] Primary Care [name]: [name] Other Clinician: Referring Marvelous Bouwens: Treating [name]/Extender: [name] in Treatment: 23 Encounter Discharge Information Items Post Procedure Vitals Discharge Condition:  Stable Temperature (F): 99.3 Ambulatory Status: Wheelchair Pulse (bpm): 87 Discharge Destination: Home Respiratory Rate (breaths/min): 20 Transportation: Private Auto Blood Pressure (mmHg): 175/79 Accompanied By: self Schedule Follow-up Appointment: Yes Clinical Summary of Care: Electronic Signature(s) Signed: 05/12/2022 5:20:38 PM By: Deon Pilling RN, BSN Entered By: Deon Pilling on 05/12/2022 16:20:11 -------------------------------------------------------------------------------- Lower Extremity Assessment Details Patient Name: Date of Service: [name], [name] 05/12/2022 3:30 PM Medical Record Number: [id number] Patient Account Number: [id number] Date of Birth/Sex: Treating RN: [birth year] (66 y.o. [name] [name] Primary Care Adonai Selsor: [name] Other Clinician: Referring Dimond Crotty: Treating [name]/Extender: [name] in Treatment: 23 Edema Assessment Assessed: Shirlyn Goltz: Yes] Patrice Paradise: No] Edema: [Left: Yes] [Right: Yes] Calf Left: Right: Point of Measurement: 27 cm From Medial Instep 31 cm Ankle Left: Right: Point of Measurement: 9 cm From Medial Instep 22 cm Vascular Assessment Pulses: Dorsalis Pedis Palpable: [Left:Yes] Electronic Signature(s) Signed: 05/12/2022 5:20:38 PM By: Deon Pilling RN, BSN Entered By: Deon Pilling on 05/12/2022 15:48:54 -------------------------------------------------------------------------------- Multi Wound Chart Details Patient Name: Date of Service: [name], [name] 05/12/2022 3:30 PM Medical Record Number: [id number] Patient Account Number: [id number] Date of Birth/Sex: Treating RN: 10/23/1956 (66 y.o. [name] [name] Primary Care [name]: [name] Other Clinician: Referring [name]: Treating [name]/Extender: [name] in Treatment: 23 Vital Signs Height(in): 65 Pulse(bpm): 22 Weight(lbs): 169 Blood Pressure(mmHg):  175/79 Body Mass Index(BMI): 31.9 Temperature(F): 99.3 Respiratory Rate(breaths/min): 20 Photos: [11:Left Calcaneus] [N/A:N/A N/A] Wound Location: [11:Gradually Appeared] [N/A:N/A] Wounding Event: [11:Diabetic Wound/Ulcer of the Lower] [N/A:N/A] Primary Etiology: [11:Extremity Cataracts, Anemia, Sleep Apnea,] [N/A:N/A] Comorbid History: [11:Coronary Artery Disease, Hypertension, Myocardial Infarction, Type II Diabetes, Osteoarthritis, Osteomyelitis, Neuropathy 10/30/2020] [N/A:N/A] Date Acquired: [11:23] [N/A:N/A] Weeks of Treatment: [11:Open] [N/A:N/A] Wound Status: [11:No] [N/A:N/A] Wound Recurrence: [11:1.7x0.9x0.2] [N/A:N/A] Measurements L x W x D (cm) [11:1.202] [N/A:N/A] A (cm) : rea [11:0.24] [N/A:N/A] Volume (cm) : [11:89.10%] [N/A:N/A] % Reduction in A [11:rea: 97.80%] [N/A:N/A] % Reduction in Volume: [  11:Grade 3] [N/A:N/A] Classification: [11:Medium] [N/A:N/A] Exudate A mount: [11:Serosanguineous] [N/A:N/A] Exudate Type: [11:red, brown] [N/A:N/A] Exudate Color: [11:Flat and Intact] [N/A:N/A] Wound Margin: [11:Large (67-100%)] [N/A:N/A] Granulation A mount: [11:Red] [N/A:N/A] Granulation Quality: [11:Small (1-33%)] [N/A:N/A] Necrotic A mount: [11:Fat Layer (Subcutaneous Tissue): Yes N/A] Exposed Structures: [11:Fascia: No Tendon: No Muscle: No Joint: No Bone: No Small (1-33%)] [N/A:N/A] Epithelialization: [11:Debridement - Excisional] [N/A:N/A] Debridement: Pre-procedure Verification/Time Out 16:10 [N/A:N/A] Taken: [11:Lidocaine 4% Topical Solution] [N/A:N/A] Pain Control: [11:Callus, Subcutaneous] [N/A:N/A] Tissue Debrided: [11:Skin/Subcutaneous Tissue] [N/A:N/A] Level: [11:2.4] [N/A:N/A] Debridement A (sq cm): [11:rea Curette] [N/A:N/A] Instrument: [11:Minimum] [N/A:N/A] Bleeding: [11:Pressure] [N/A:N/A] Hemostasis A chieved: [11:0] [N/A:N/A] Procedural Pain: [11:0] [N/A:N/A] Post Procedural Pain: [11:Procedure was tolerated well] [N/A:N/A] Debridement  Treatment Response: [11:2x1.2x0.2] [N/A:N/A] Post Debridement Measurements L x W x D (cm) [11:0.377] [N/A:N/A] Post Debridement Volume: (cm) [11:periwound callous.] [N/A:N/A] Assessment Notes: [11:Debridement] [N/A:N/A] Treatment Notes Wound #11 (Calcaneus) Wound Laterality: Left Cleanser Normal Saline Discharge Instruction: Cleanse the wound with Normal Saline prior to applying a clean dressing using gauze sponges, not tissue or cotton balls. Wound Cleanser Discharge Instruction: Cleanse the wound with wound cleanser prior to applying a clean dressing using gauze sponges, not tissue or cotton balls. Peri-Wound Care Zinc Oxide Ointment 30g tube Discharge Instruction: Apply Zinc Oxide to periwound with each dressing change Topical Primary Dressing KerraCel Ag Gelling Fiber Dressing, 4x5 in (silver alginate) Discharge Instruction: Apply silver alginate to wound bed as instructed; Apply plain alginate in clinic related to MRI tomorrow. Secondary Dressing ABD Pad, 5x9 Discharge Instruction: Apply over primary dressing as directed. Optifoam Non-Adhesive Dressing, 4x4 in Discharge Instruction: Apply over primary dressing to make foam donut Woven Gauze Sponge, Non-Sterile 4x4 in Discharge Instruction: Apply over primary dressing as directed. Secured With [employer], 4.5x3.1 (in/yd) Discharge Instruction: Secure with Kerlix as directed. 61M Medipore Soft Cloth Surgical T 2x10 (in/yd) ape Discharge Instruction: Secure with tape as directed. Compression Wrap Compression Stockings Add-Ons Electronic Signature(s) Signed: 05/12/2022 4:59:49 PM By: Linton Ham MD Signed: 05/16/2022 5:31:58 PM By: Baruch Gouty RN, BSN Entered By: Linton Ham on 05/12/2022 16:33:47 -------------------------------------------------------------------------------- Multi-Disciplinary Care Plan Details Patient Name: Date of Service: [name], [name] 05/12/2022 3:30 PM Medical Record Number:  [id number] Patient Account Number: [id number] Date of Birth/Sex: Treating RN: 21-Mar-1956 (66 y.o. [name] [name], Tammi Klippel Primary Care Satya Bohall: [name] Other Clinician: Referring Zakira Ressel: Treating Cammeron Greis/Extender: [name] in Treatment: [place] reviewed with physician Active Inactive Nutrition Nursing Diagnoses: Impaired glucose control: actual or potential Potential for alteratiion in Nutrition/Potential for imbalanced nutrition Goals: Patient/caregiver agrees to and verbalizes understanding of need to use nutritional supplements and/or vitamins as prescribed Date Initiated: 11/29/2021 Date Inactivated: 01/03/2022 Target Resolution Date: 12/30/2021 Goal Status: Met Patient/caregiver will maintain therapeutic glucose control Date Initiated: 11/29/2021 Target Resolution Date: 06/30/2022 Goal Status: Active Interventions: Assess HgA1c results as ordered upon admission and as needed Assess patient nutrition upon admission and as needed per policy Provide education on elevated blood sugars and impact on wound healing Provide education on nutrition Treatment Activities: Education provided on Nutrition : 11/29/2021 Notes: Wound/Skin Impairment Nursing Diagnoses: Impaired tissue integrity Knowledge deficit related to ulceration/compromised skin integrity Goals: Patient/caregiver will verbalize understanding of skin care regimen Date Initiated: 11/29/2021 Target Resolution Date: 06/30/2022 Goal Status: Active Interventions: Assess patient/caregiver ability to obtain necessary supplies Assess patient/caregiver ability to perform ulcer/skin care regimen upon admission and as needed Assess ulceration(s) every visit Provide education on ulcer and skin care Notes: Electronic Signature(s)  Signed: 05/12/2022 5:20:38 PM By: Deon Pilling RN, BSN Entered By: Deon Pilling on 05/12/2022  16:16:24 -------------------------------------------------------------------------------- Pain Assessment Details Patient Name: Date of Service: [name], [name] 05/12/2022 3:30 PM Medical Record Number: [id number] Patient Account Number: [id number] Date of Birth/Sex: Treating RN: [birth year] (66 y.o. [name] [name] Primary Care Yuriana Gaal: [name] Other Clinician: Referring Rella Egelston: Treating Consandra Laske/Extender: [name] in Treatment: 23 Active Problems Location of Pain Severity and Description of Pain Patient Has Paino Yes Site Locations Pain Location: Pain in Ulcers Rate the pain. Current Pain Level: 5 Pain Management and Medication Current Pain Management: Medication: No Cold Application: No Rest: No Massage: No Activity: No T.E.N.S.: No Heat Application: No Leg drop or elevation: No Is the Current Pain Management Adequate: Adequate How does your wound impact your activities of daily livingo Sleep: No Bathing: No Appetite: No Relationship With Others: No Bladder Continence: No Emotions: No Bowel Continence: No Work: No Toileting: No Drive: No Dressing: No Hobbies: No Engineer, maintenance) Signed: 05/12/2022 5:20:38 PM By: Deon Pilling RN, BSN Entered By: Deon Pilling on 05/12/2022 15:47:36 -------------------------------------------------------------------------------- Patient/Caregiver Education Details Patient Name: Date of Service: [name], [name] 7/14/2023andnbsp3:30 PM Medical Record Number: [id number] Patient Account Number: [id number] Date of Birth/Gender: Treating RN: 26-Oct-1956 (66 y.o. [name] [name] Primary Care Physician: [name] Other Clinician: Referring Physician: Treating Physician/Extender: [name] in Treatment: 23 Education Assessment Education Provided To: Patient Education Topics Provided Wound/Skin Impairment: Handouts: Skin  Care Do's and Dont's Methods: Explain/Verbal Responses: Reinforcements needed Electronic Signature(s) Signed: 05/12/2022 5:20:38 PM By: Deon Pilling RN, BSN Entered By: Deon Pilling on 05/12/2022 16:16:36 -------------------------------------------------------------------------------- Wound Assessment Details Patient Name: Date of Service: [name], [name] 05/12/2022 3:30 PM Medical Record Number: [id number] Patient Account Number: [id number] Date of Birth/Sex: Treating RN: 1955/12/02 (66 y.o. [name] [name], [name] Primary Care Torrian Canion: [name] Other Clinician: Referring Obbie Lewallen: Treating Tinesha Siegrist/Extender: [name] in Treatment: 23 Wound Status Wound Number: 11 Primary Diabetic Wound/Ulcer of the Lower Extremity Etiology: Wound Location: Left Calcaneus Wound Open Wounding Event: Gradually Appeared Status: Date Acquired: 10/30/2020 Comorbid Cataracts, Anemia, Sleep Apnea, Coronary Artery Disease, Weeks Of Treatment: 23 History: Hypertension, Myocardial Infarction, Type II Diabetes, Clustered Wound: No Osteoarthritis, Osteomyelitis, Neuropathy Photos Wound Measurements Length: (cm) 1.7 Width: (cm) 0.9 Depth: (cm) 0.2 Area: (cm) 1.202 Volume: (cm) 0.24 % Reduction in Area: 89.1% % Reduction in Volume: 97.8% Epithelialization: Small (1-33%) Tunneling: No Undermining: No Wound Description Classification: Grade 3 Wound Margin: Flat and Intact Exudate Amount: Medium Exudate Type: Serosanguineous Exudate Color: red, brown Foul Odor After Cleansing: No Slough/Fibrino Yes Wound Bed Granulation Amount: Large (67-100%) Exposed Structure Granulation Quality: Red Fascia Exposed: No Necrotic Amount: Small (1-33%) Fat Layer (Subcutaneous Tissue) Exposed: Yes Necrotic Quality: Adherent Slough Tendon Exposed: No Muscle Exposed: No Joint Exposed: No Bone Exposed: No Assessment Notes periwound callous. Treatment Notes Wound  #11 (Calcaneus) Wound Laterality: Left Cleanser Normal Saline Discharge Instruction: Cleanse the wound with Normal Saline prior to applying a clean dressing using gauze sponges, not tissue or cotton balls. Wound Cleanser Discharge Instruction: Cleanse the wound with wound cleanser prior to applying a clean dressing using gauze sponges, not tissue or cotton balls. Peri-Wound Care Zinc Oxide Ointment 30g tube Discharge Instruction: Apply Zinc Oxide to periwound with each dressing change Topical Primary Dressing KerraCel Ag Gelling Fiber Dressing, 4x5 in (silver alginate) Discharge Instruction: Apply silver alginate to wound bed as instructed;  Apply plain alginate in clinic related to MRI tomorrow. Secondary Dressing ABD Pad, 5x9 Discharge Instruction: Apply over primary dressing as directed. Optifoam Non-Adhesive Dressing, 4x4 in Discharge Instruction: Apply over primary dressing to make foam donut Woven Gauze Sponge, Non-Sterile 4x4 in Discharge Instruction: Apply over primary dressing as directed. Secured With [employer], 4.5x3.1 (in/yd) Discharge Instruction: Secure with Kerlix as directed. 108M Medipore Soft Cloth Surgical T 2x10 (in/yd) ape Discharge Instruction: Secure with tape as directed. Compression Wrap Compression Stockings Add-Ons Electronic Signature(s) Signed: 05/12/2022 5:20:38 PM By: Deon Pilling RN, BSN Entered By: Deon Pilling on 05/12/2022 15:51:03 -------------------------------------------------------------------------------- Vitals Details Patient Name: Date of Service: [name], [name] 05/12/2022 3:30 PM Medical Record Number: [id number] Patient Account Number: [id number] Date of Birth/Sex: Treating RN: [birth year] (66 y.o. [name] [name], Tammi Klippel Primary Care Nohemy Koop: [name] Other Clinician: Referring Tatum Massman: Treating [name]/Extender: [name] in Treatment: 23 Vital Signs Time Taken:  15:45 Temperature (F): 99.3 Height (in): 61 Pulse (bpm): 87 Weight (lbs): 169 Respiratory Rate (breaths/min): 20 Body Mass Index (BMI): 31.9 Blood Pressure (mmHg): 175/79 Reference Range: 80 - 120 mg / dl Electronic Signature(s) Signed: 05/12/2022 5:20:38 PM By: Deon Pilling RN, BSN Entered By: Deon Pilling on 05/12/2022 15:47:26

## 2022-05-16 NOTE — Progress Notes (Signed)
[name], [name] ([id number]) Visit Report for 05/12/2022 Debridement Details Patient Name: Date of Service: [name] [name], [name] 05/12/2022 3:30 PM Medical Record Number: [id number] Patient Account Number: [id number] Date of Birth/Sex: Treating RN: 10-09-1956 (66 y.o. [name] Primary Care Provider: [name] Other Clinician: Referring Provider: Treating Provider/Extender: [name] in Treatment: 23 Debridement Performed for Assessment: Wound #11 Left Calcaneus Performed By: Physician Ricard Dillon., MD Debridement Type: Debridement Severity of Tissue Pre Debridement: Fat layer exposed Level of Consciousness (Pre-procedure): Awake and Alert Pre-procedure Verification/Time Out Yes - 16:10 Taken: Start Time: 16:11 Pain Control: Lidocaine 4% T opical Solution T Area Debrided (L x W): otal 2 (cm) x 1.2 (cm) = 2.4 (cm) Tissue and other material debrided: Viable, Non-Viable, Callus, Subcutaneous, Skin: Dermis , Skin: Epidermis Level: Skin/Subcutaneous Tissue Debridement Description: Excisional Instrument: Curette Bleeding: Minimum Hemostasis Achieved: Pressure End Time: 16:18 Procedural Pain: 0 Post Procedural Pain: 0 Response to Treatment: Procedure was tolerated well Level of Consciousness (Post- Awake and Alert procedure): Post Debridement Measurements of Total Wound Length: (cm) 2 Width: (cm) 1.2 Depth: (cm) 0.2 Volume: (cm) 0.377 Character of Wound/Ulcer Post Debridement: Improved Severity of Tissue Post Debridement: Fat layer exposed Post Procedure Diagnosis Same as Pre-procedure Electronic Signature(s) Signed: 05/12/2022 4:59:49 PM By: Linton Ham MD Signed: 05/16/2022 5:31:58 PM By: Baruch Gouty RN, BSN Entered By: Linton Ham on 05/12/2022 16:35:47 -------------------------------------------------------------------------------- HPI Details Patient Name: Date of Service: [name] [name], [name] [name] 05/12/2022  3:30 PM Medical Record Number: [id number] Patient Account Number: [id number] Date of Birth/Sex: Treating RN: [birth year] (66 y.o. [name] Primary Care Provider: [name] Other Clinician: Referring Provider: Treating Provider/Extender: [name] in Treatment: 23 History of Present Illness HPI Description: ADMISSION 11/12/2018 This is a 66 year old woman with type 2 diabetes and diabetic neuropathy. She has been dealing with a left heel plantar wound for roughly 2 years. She states this started when she pulled some skin off the area and it progressed into a wound. She also has an area on the tip of her left great toe for 1 year. She has been largely followed by Dr. Sharol Given and she had an excision of bone in the left heel in 2017. I do not see microbiology from this excision or pathology. Apparently this wound never really closed. She most recently has been using Silvadene cream. Offloading this with a scooter. The last MRI I see was in June 2018 which did not show osteomyelitis at that time. Apparently this wound is never really progressed towards healing. During her last review by Dr. Sharol Given in October it was recommended that she undergo a left BKA and she refused. She went to see a second orthopedic consult at [facility] who am recommended conservative wound care to see if this will close or progress towards closure but also warned that possible surgery may be necessary. An x-ray that was done at [facility] showed an irregular calcaneal body and calcaneal tuberosity. This is probably postprocedural. Previous x-rays at [facility] had suggested heterotrophic calcifications but I do not see this. Apparently a second ointment was added to the Silvadene which the patient thinks is because some improvement. She has not been systemically unwell. No fever or chills. She thinks the second ointment that was given to her at [facility] has helped somewhat. 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 ABIs done in June 2018 showed a ABI of 1.12 on the right and 1.1 on the left she was biphasic bilaterally. ABIs in our clinic were noncompressible today. 11/20/18 on evaluation today patient is seen for her second visit here in the office although this is actually the first visit with me concerning an issue that she's having with her great toe and heel of the left foot. Fortunately she does not appear to be have any discomfort at this time and she does have a scooter in order to offload her foot she also has a boot for offloading. With that being said this is something that appears to have been going on for some time she was seeing Dr. due to his recommendation was that she was going to require a below knee amputation. With that being said she wanted to come to the wound center but according to the patient Dr. Sharol Given told her that "we could not help her". Nonetheless she saw another provider who suggested that it may be worth a shot for [name] to try and help her out if it all possible. Nonetheless she decided that it would be worth a trial since otherwise any the way she's gonna end up with an amputation. Obviously if we can heal the wound then that will not be the case. Again I explained to her that obviously there are no guarantees but we will give this a good try and attempts to get the wound to heal. 1/30; the patient continues to have areas on the left plantar heel and the left plantar great toe. The more worrisome area is the heel. She went for MRI on Saturday but this could not be done because she had silver alginate in the wound bed. I would like to get this rebooked.  If she does not have osteomyelitis she will need a total contact cast. We have been using silver alginate in the wounds 2/7; patient has wounds on her left plantar heel and left plantar great toe. The area on the heel has some depth although it looks about the same today. Her MRI will finally be done tomorrow. If she has osteomyelitis in the heel and then we will need to consider her for IV antibiotics and hyperbaric oxygen. If the MRI is negative she will need a total contact cast although she is wearing a cam boot and using a scooter at present. Will be using silver alginate. She will take it off tomorrow in preparation for the MRI 2/14; the patient's MRI showed extensive surgical changes with a large portion of her calcaneus removed on the left secondary to her previous surgery by Dr. Sharol Given however there is no evidence of osteomyelitis. She would therefore be a candidate for a total contact cast and we applied this for the first time today 2/21; silver collagen total contact cast. The area on the plantar left great toe is "healed" still a lot of callus on this area. Dimensions on the plantar heel not too much different some epithelialization is present however. This is an improvement 12/25/18 on evaluation today patient actually is seen for follow-up concerning issues that she has been having with her cast she states she feels like it's wet and squishy in the bottom of her cast. The reason she does come in to have  this evaluated and see what is going on. Fortunately there's no signs of infection at this time was the cast was removed. 3/6; the patient's area on the tip of her right great toe is callused but there is no open area here. She still has the fairly extensive area in the left heel. We have been using silver collagen in the wound. 01/08/19 on evaluation today patient actually appears to be doing very well in regard to her heel ulcer in my pinion to see if you shown signs of improvement which  is excellent news. There's no evidence of infection. She continues to have quite a bit of drainage but fortunately again this doesn't seem to be hindering her healing. 3/20 -Patient's foot ulcer on the left appears to be doing well, the dimensions appear encouraging, we have been using total contact cast with Prisma and will continue doing that 3/27; patient continues to make nice progress on the left plantar heel. She is using silver collagen under a total contact cast. It is been a while since I have seen this wound and it really looks a lot better. Smaller with healthy granulation 4/3; she continues to make nice progress on the left plantar heel. Using silver collagen under a total contact cast 4/10; left plantar heel. Again skin over the surface of the wound with not much in the way of adherence. This results in undermining. Using silver collagen changed to silver alginate 4/17 left plantar heel. Again not much improvement. There is no undermining today no debridement was required we used silver alginate last time because of excessive moisture 4/24 left plantar heel. Again not as much improvement as I would have liked. About 3 mm of depth. Thick callused skin around the circumference. Using silver alginate 5/1; left plantar heel this is improved this week. Less depth epithelialization is present. Using silver alginate alginate under a total contact cast 5/8; left plantar heel dimensions are about the same. The depth appears to be improved. I use silver collagen starting today under the cast 5/15 left plantar heel. Arrives today with a 2-day history of feeling like something had "slipped" while walking her dog in the cast. Unfortunately extensive area relatively to the small wound of undermining superiorly denuded epithelium. 5/22-Patient returns at 1 week after being taken off the TCC on account of some fluid collection that was debrided and cultured at last visit from the plantar ulcer on the  left heel. The culture results are polymicrobial with Klebsiella, enterococcus, Proteus growth these organisms are sensitive to Cipro except with enterococcus which is sensitive to ampicillin but patient is highly allergic to penicillin according to her. 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 5/29; the patient arrived last week with a new wound on her left plantar great toe. With regards to the culture that I did 2 weeks ago of her deteriorating heel wound this grew Klebsiella and enterococcus. She was given Cipro however the enterococcus would not be covered well by a quinolone. She is allergic to penicillin. I will give her linezolid 600 twice daily x5 days 6/12; the patient continues to have a wound on her left and after she returns to the beach there is undermining laterally. She has the new wound from this 2 weeks or so ago on the plantar tip of her left great toe. She had a fall today scraping the dorsal surface of the left fifth 7/14;  READMISSION since the patient was last here she was hospitalized from 04/15/2019 through 04/22/2019. She had presented to an outside ER after falling and hitting her elbow. She had had previous surgery on the elbow and fractures several years ago. An x-ray was negative and she was sent home. She is readmitted with sepsis and acute renal failure secondary to septic arthritis of the elbow. Cultures apparently showed pansensitive staph aureus however she has a severe beta-lactam allergy. She was treated with vancomycin. Apparently the vancomycin is completed and her PICC line is removed although she is seeing Dr. Megan Salon tomorrow due to continued pain and swelling. In the hospital she had an IandD by Dr. [name] of orthopedics. The original surgery was on 04/17/2019 and it was felt that she had septic arthritis. She is still having a lot of pain and swelling in the right elbow and  apparently is due to see Dr. Megan Salon tomorrow and what she thinks is that she will be restarted on antibiotics. With regards to her heel wounds/left foot wounds. Her arterial studies were checked and her ABIs were within normal limits. X-ray showed plantar foot ulcer negative for osteomyelitis postop resection of the posterior calcaneus. She has been using silver alginate to the heel 8/4-Patient returns after being seen on 7/14, we are using silver alginate to the calcaneal wound, she is continuing to receive care for her right elbow septic arthritis 8/14- Patient returns after 1 week, she is being seen by the surgical group for her right elbow, she is here for the left calcaneal wound for which we are using silver alginate this is about the same 8/21; this is a patient I readmitted to the clinic 5 weeks ago. She is continuing to have difficulties with a septic arthritis of the right elbow she had after a fall and apparently has had surgical IandD's since the last time we saw her. She is also following with Dr. Megan Salon of infectious disease and is apparently on 3 oral antibiotics although at the time of this dictation I am not sure what they are. She comes in today with a necrotic surface on the left great toe with a blister laterally. This was still clearly an open wound. She had purulent drainage coming out of this. The original wound on the plantar aspect of her right heel in the setting of a Charcot foot is deep not open to bone but certainly not any better at all. We have been using silver alginate 8/28; dealing with septic arthritis of the right elbow. May need to go for further surgery here in the second week of September. She had a blister on the left great toe that was purulent [place] draining last week although a culture did not grow anything [already on antibiotics through infectious disease for her elbow]. The punched-out area on her heel is just like it was when she first came in. This  almost closed with a total contact cast there are no options for that now. The patient states she cannot stay off her foot having to do housework X-ray of the foot showed a moderate bunion and severe degenerative changes at the first metatarsal phalangeal joint there was a moderate plantar calcaneal spur. We will need to check the location of this. 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 It is oral. We are using  silver collagen to the punched-out area on her left heel and to the summer superficial area of the left great toe. She is offloading this is best she can although judging by the amount of callus it is not enough. She thinks she has enough strength in her right elbow now to use her scooter. 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 Hale Bogus notes and infectious disease. We are using silver collagen to the punched-out areas on her left heel and to the plantar aspect of her left great toe she is now using her scooter to get around and to help offload these areas 10/2; 2-week follow-up. Still on clindamycin I believe for the left elbow she is going for an MRI of the elbow over the weekend. She is then going to see orthopedics and infectious disease. The area on the left heel is certainly no better. This does not probe to bone however there is green drainage. She has an area on the tip of her toe. The area on the heel is in a wound we almost closed at one point with total contact casting. 10/9; one-week follow-up. Culture I did of the heel last week which was a swab culture admittedly showed moderate Enterococcus faecalis moderate Pseudomonas. The wound itself looks about the  same. There is no palpable bone although the depth of it closely approximates bone. The heel is swollen but not erythematous. Her MRI is booked for next week. She also has an MRI of the right elbow. I've also lifted the last consult from Dr. Megan Salon who is following her for septic arthritis of the right elbow. I did not see him specifically comment on her left heel however the patient states that he is aware of this. I did not specifically address the organisms I cultured last week because of the possible effect of any additional cultures that will be done on the elbow. Additionally these were superficial not bone cultures 10/16; her MRI of the heel was put back to next week. She did have her elbow done. I have been in contact with Dr. Megan Salon about my concerns about osteomyelitis of the left heel. We have been using collagen. She also has a wound on the plantar tip of her first toe 10/23; her MRI of the heel was negative for osteomyelitis but suggested a small abscess. She also had an MRI of her elbow that showed findings compatible with a septic joint. She went to see Dr. Megan Salon and she is going to have both oral and IV antibiotics which I am sure will cover any infection in the heel. I did not actually see his note today. We are using silver collagen offloading the heel with a heel offloading boot 11/6; patient continues with silver collagen to both wound areas on her plantar left heel and plantar left great toe. She remains on daptomycin by Dr. Megan Salon of infectious disease. She complains of diarrhea. Dr. Megan Salon is aware of this. 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 she has been put on oral vancomycin I think a lot of high clinical suspicion for pseudomembranous colitis Because of the right elbow there are no options to completely off her  left foot  beyond the heel offloading boot we have now. Fortunately the left heel ulcer itself has remained static some improvement in the plantar left great toe 11/20; patient arrives for review of her left heel ulcer. I also note she has a difficult time with septic arthritis of the right elbow. She currently is on IV daptomycin which seems to have helped the drainage in her elbow [MSSA]. Because of high concern for pseudomembranous colitis she was also put on vancomycin orally and Flagyl orally. She was on Levaquin but that was discontinued because of the concern about C. difficile. She states her diarrhea is better. She arrives in clinic today with a large area of denuded skin on the lateral part of the heel. This had totally separated. We have been using silver alginate to the wound areas. She arrived in a scooter telling me she is offloading the foot is much as possible. 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 Dr. Megan Salon stopped the daptomycin and now has her on vancomycin 1000 mg IV daily. This is supposed to go onto 1/18. She is being referred to [place] for what sounds like a elbow replacement type operation for her MSSA septic arthritis. She arrives in clinic today with a blister on the medial part of the wound on her heel. She still has a lot of callus around the heel although the surface of the wound on the left heel looks somewhat better. READMISSION 03/19/2020 Mrs. Mcglocklin is a 66 year old woman we have followed for almost all of 2020 with a neuropathic wound on her left heel and the tip of her left great toe.. We she had previously had a partial calcanectomy by Dr. Sharol Given because of underlying osteomyelitis. She also had a history of MRSA even when we admitted her to the clinic. An MRI when she  first came into our clinic did not show osteomyelitis. We put her in a total contact cast and gradually this contracted to the point it was almost closed however in that same timeframe she had a fall. 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 We had to stop putting her in a cast partially because of infection and partially because she could not support herself with the elbow. Things went progressively downhill. When we last saw her at the end of December she had a large open wound on the left heel. She had had recurrent infections in the right elbow. It was felt that either the heel lift: Ice her elbow or vice versa. We never had MRSA I do not believe cultured in the left heel however. 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 She underwent an operative debridement and a T11-L3 fusion. An operative culture grew Pseudomonas. She was treated with cefepime and required readmission to the hospital with acute kidney failure requiring temporary dialysis. She was discharged to rehab I believe she signed herself out [place]. He has been following with her primary doctor and she comes in the clinic today with miraculously the left heel totally closed. She is followed up with Dr. Megan Salon of infectious disease he does not feel she has any evidence of infection in her elbow or her heel for that matter and the heel is actually fully epithelialized. She  is on ciprofloxacin 500 twice daily I think mostly directed at Pseudomonas. The patient is convinced that it was the treatment of the Pseudomonas in her back that ultimately led to closure of her heel and that certainly possible or could have been because she was nonambulatory in the hospital for 3 months according to her. She is still using her heel offloading boot and she says she is "learning to walk again" Readmission: 06/09/2020 patient presents today for  reevaluation here in the clinic following a reopening of her heel ulcer. She tells [name] that this actually occurred about 2 weeks after she was last seen in May of this year though it is now the beginning of August and she has not come back until now to see [name]. Nonetheless she does have an open wound there is been no x-rays at this time infectious disease has been monitoring him following this up until this point. They made the recommendation that she come to see [name]. Fortunately there is no signs of active infection systemically though she does tell me that she had Pseudomonas in the spine when she had her back surgery she is currently on antibiotics for that. 8/20; this is a patient who I saw 1 time in late May. Apparently shortly after she was here the area on her plantar left heel started to drain again and she had an open wound. She has been going to her primary physician and has been following with Dr. Megan Salon. 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 She is currently on 3 times a day prophylactic ciprofloxacin. She is admitted to the clinic last week for review of the wound on the left heel. Notable for the fact that she had a partial calcanectomy by Dr. Sharol Given 3 or 4 years ago. She says she is using her motorized wheelchair again as an off loader but she came in in regular shoes. 9/2; 2-week follow-up. Plantar left heel. In spite of debridement last week she had thick very adherent callus around the circumference of the wound and a nonviable gray surface. We have been using Hydrofera Blue. We gave her a heel offloading boot last week. She says she is off her foot except for she needs to walk her dog. 9/17; 2 week follow-up. Lives with a heel ulcer in roughly the same condition as a month ago. Though requiring debridement and revision using Hydrofera Blue. She says she is often except for times that she  needs to walk her dog and she claims to be using the heel offloading shoe. She has a new injury which she says was a burn on the medial part of her fifth metacarpophalangeal on the right. 10/15; almost 1 month follow-up. She arrives with the plantar left heel ulcer and not a very good condition. She has thick callus surrounding the wound and a necrotic base over the top of this. I am not sure anybody is paying close attention to this. She says she walks minimally but I highly doubt this. She is supposed to be using Hydrofera Blue. She says she has plenty of supplies. She tells me that she is traveling to [place] next week for a 10-day trip. We will see her back after that She has both been approved for Dermagraft however I am only willing to attempt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 There was a Excisional Skin/Subcutaneous Tissue Debridement with a total area of 2.4 sq cm performed by Ricard Dillon., MD. With the following instrument(s): Curette to remove Viable and Non-Viable tissue/material. Material removed includes Callus, Subcutaneous Tissue, Skin: Dermis, and Skin: Epidermis after achieving pain control using Lidocaine 4% T opical Solution. A time out was conducted at 16:10, prior to the start of the procedure. A Minimum amount of bleeding was controlled with Pressure. The procedure was tolerated well with a pain level of 0 throughout and a pain level of 0 following the procedure. Post Debridement Measurements: 2cm length x 1.2cm width x 0.2cm depth; 0.377cm^3 volume. Character of Wound/Ulcer Post Debridement is improved. Severity of Tissue Post Debridement is: Fat layer exposed. Post procedure Diagnosis Wound #11: Same as Pre-Procedure Plan Follow-up Appointments: Return Appointment in 2 weeks. - Dr. Celine Ahr Rm 3 Friday 245pm 05/23/2022 Bathing/ Shower/ Hygiene:  May shower with protection but do not get wound dressing(s) wet. Edema Control - Lymphedema / SCD / Other: Elevate legs to the level of the heart or above for 30 minutes daily and/or when sitting, a frequency of: - throughout the day Avoid standing for long periods of time. Moisturize legs daily. WOUND #11: - Calcaneus Wound Laterality:  Left Cleanser: Normal Saline Every Other Day/30 Days Discharge Instructions: Cleanse the wound with Normal Saline prior to applying a clean dressing using gauze sponges, not tissue or cotton balls. Cleanser: Wound Cleanser Every Other Day/30 Days Discharge Instructions: Cleanse the wound with wound cleanser prior to applying a clean dressing using gauze sponges, not tissue or cotton balls. Peri-Wound Care: Zinc Oxide Ointment 30g tube Every Other Day/30 Days Discharge Instructions: Apply Zinc Oxide to periwound with each dressing change Prim Dressing: KerraCel Ag Gelling Fiber Dressing, 4x5 in (silver alginate) Every Other Day/30 Days ary Discharge Instructions: Apply silver alginate to wound bed as instructed; Apply plain alginate in clinic related to MRI tomorrow. Secondary Dressing: ABD Pad, 5x9 Every Other Day/30 Days Discharge Instructions: Apply over primary dressing as directed. Secondary Dressing: Optifoam Non-Adhesive Dressing, 4x4 in Every Other Day/30 Days Discharge Instructions: Apply over primary dressing to make foam donut Secondary Dressing: Woven Gauze Sponge, Non-Sterile 4x4 in Every Other Day/30 Days Discharge Instructions: Apply over primary dressing as directed. Secured With: [employer], 4.5x3.1 (in/yd) Every Other Day/30 Days Discharge Instructions: Secure with Kerlix as directed. Secured With: 32M Medipore Public affairs consultant Surgical T 2x10 (in/yd) Every Other Day/30 Days ape Discharge Instructions: Secure with tape as directed. 1. We continue with silver alginate, OPTi foam and ABD. She is not specifically offloading this wound however it actually appears smaller and somewhat healthier 2. Follow-up in 2 weeks Electronic Signature(s) Signed: 05/12/2022 4:59:49 PM By: Linton Ham MD Entered By: Linton Ham on 05/12/2022 16:38:19 -------------------------------------------------------------------------------- SuperBill Details Patient Name: Date of  Service: [name] [name], [name] [name] 05/12/2022 Medical Record Number: [id number] Patient Account Number: [id number] Date of Birth/Sex: Treating RN: 1956/03/02 (66 y.o. 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 necrosis of bone M86.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, right ankle and foot M86.672 Other chronic osteomyelitis, left ankle and foot Facility Procedures CPT4 Code: 63817711 Description: 65790 - DEB SUBQ TISSUE 20 SQ CM/< ICD-10 Diagnosis Description L97.524 Non-pressure chronic ulcer of other part of left foot with necrosis of bone Modifier: Quantity: 1 Physician Procedures : CPT4 Code Description Modifier [id number] - WC PHYS SUBQ TISS 20 SQ CM ICD-10 Diagnosis Description L97.524 Non-pressure chronic ulcer of other part of left foot with necrosis of bone Quantity: 1 Electronic Signature(s) Signed: 05/12/2022 4:59:49 PM By: Linton Ham MD Entered By: Linton Ham on 05/12/2022 16:38:27

## 2022-05-16 NOTE — Progress Notes (Signed)
Dr. Marin Olp notified of HGB-8.4.  Order received for pt to come in weekly for labs and injection and to give Retacrit today.

## 2022-05-16 NOTE — Patient Instructions (Signed)
Epoetin Alfa injection ?What is this medication? ?EPOETIN ALFA (e POE e tin AL fa) helps your body make more red blood cells. This medicine is used to treat anemia caused by chronic kidney disease, cancer chemotherapy, or HIV-therapy. It may also be used before surgery if you have anemia. ?This medicine may be used for other purposes; ask your health care provider or pharmacist if you have questions. ?COMMON BRAND NAME(S): Epogen, Procrit, Retacrit ?What should I tell my care team before I take this medication? ?They need to know if you have any of these conditions: ?cancer ?heart disease ?high blood pressure ?history of blood clots ?history of stroke ?low levels of folate, iron, or vitamin B12 in the blood ?seizures ?an unusual or allergic reaction to erythropoietin, albumin, benzyl alcohol, hamster proteins, other medicines, foods, dyes, or preservatives ?pregnant or trying to get pregnant ?breast-feeding ?How should I use this medication? ?This medicine is for injection into a vein or under the skin. It is usually given by a health care professional in a hospital or clinic setting. ?If you get this medicine at home, you will be taught how to prepare and give this medicine. Use exactly as directed. Take your medicine at regular intervals. Do not take your medicine more often than directed. ?It is important that you put your used needles and syringes in a special sharps container. Do not put them in a trash can. If you do not have a sharps container, call your pharmacist or healthcare provider to get one. ?A special MedGuide will be given to you by the pharmacist with each prescription and refill. Be sure to read this information carefully each time. ?Talk to your pediatrician regarding the use of this medicine in children. While this drug may be prescribed for selected conditions, precautions do apply. ?Overdosage: If you think you have taken too much of this medicine contact a poison control center or emergency  room at once. ?NOTE: This medicine is only for you. Do not share this medicine with others. ?What if I miss a dose? ?If you miss a dose, take it as soon as you can. If it is almost time for your next dose, take only that dose. Do not take double or extra doses. ?What may interact with this medication? ?Interactions have not been studied. ?This list may not describe all possible interactions. Give your health care provider a list of all the medicines, herbs, non-prescription drugs, or dietary supplements you use. Also tell them if you smoke, drink alcohol, or use illegal drugs. Some items may interact with your medicine. ?What should I watch for while using this medication? ?Your condition will be monitored carefully while you are receiving this medicine. ?You may need blood work done while you are taking this medicine. ?This medicine may cause a decrease in vitamin B6. You should make sure that you get enough vitamin B6 while you are taking this medicine. Discuss the foods you eat and the vitamins you take with your health care professional. ?What side effects may I notice from receiving this medication? ?Side effects that you should report to your doctor or health care professional as soon as possible: ?allergic reactions like skin rash, itching or hives, swelling of the face, lips, or tongue ?seizures ?signs and symptoms of a blood clot such as breathing problems; changes in vision; chest pain; severe, sudden headache; pain, swelling, warmth in the leg; trouble speaking; sudden numbness or weakness of the face, arm or leg ?signs and symptoms of a stroke like   changes in vision; confusion; trouble speaking or understanding; severe headaches; sudden numbness or weakness of the face, arm or leg; trouble walking; dizziness; loss of balance or coordination ?Side effects that usually do not require medical attention (report to your doctor or health care professional if they continue or are  bothersome): ?chills ?cough ?dizziness ?fever ?headaches ?joint pain ?muscle cramps ?muscle pain ?nausea, vomiting ?pain, redness, or irritation at site where injected ?This list may not describe all possible side effects. Call your doctor for medical advice about side effects. You may report side effects to FDA at 1-800-FDA-1088. ?Where should I keep my medication? ?Keep out of the reach of children. ?Store in a refrigerator between 2 and 8 degrees C (36 and 46 degrees F). Do not freeze or shake. Throw away any unused portion if using a single-dose vial. Multi-dose vials can be kept in the refrigerator for up to 21 days after the initial dose. Throw away unused medicine. ?NOTE: This sheet is a summary. It may not cover all possible information. If you have questions about this medicine, talk to your doctor, pharmacist, or health care provider. ?? 2023 Elsevier/Gold Standard (2017-06-19 00:00:00) ? ?

## 2022-05-16 NOTE — Telephone Encounter (Signed)
Labs from 7/6 e-faxed to number provided.

## 2022-05-17 LAB — IRON AND IRON BINDING CAPACITY (CC-WL,HP ONLY)
Iron: 37 ug/dL (ref 28–170)
Saturation Ratios: 14 % (ref 10.4–31.8)
TIBC: 259 ug/dL (ref 250–450)
UIBC: 222 ug/dL (ref 148–442)

## 2022-05-22 ENCOUNTER — Other Ambulatory Visit: Payer: Self-pay | Admitting: Neurosurgery

## 2022-05-23 ENCOUNTER — Encounter (HOSPITAL_BASED_OUTPATIENT_CLINIC_OR_DEPARTMENT_OTHER): Payer: PRIVATE HEALTH INSURANCE | Admitting: General Surgery

## 2022-05-23 ENCOUNTER — Telehealth: Payer: Self-pay | Admitting: *Deleted

## 2022-05-23 NOTE — Chronic Care Management (AMB) (Signed)
  Chronic Care Management   Outreach Note  05/23/2022 Name: [name] [name] MRN: [id number] DOB: [birth year]  [name] [name] is a 66 y.o. year old adult who is a primary care patient of Ann Held, DO. I reached out to [employer] by phone today in response to a referral sent by [name]'s primary care provider.  An unsuccessful telephone outreach was attempted today. The patient was referred to the case management team for assistance with care management and care coordination.   Follow Up Plan: A HIPAA compliant phone message was left for the patient providing contact information and requesting a return call.   Julian Hy, [place] Direct Dial: [phone]

## 2022-05-24 ENCOUNTER — Encounter: Payer: Self-pay | Admitting: Family

## 2022-05-24 ENCOUNTER — Inpatient Hospital Stay (HOSPITAL_BASED_OUTPATIENT_CLINIC_OR_DEPARTMENT_OTHER): Payer: PRIVATE HEALTH INSURANCE | Admitting: Family

## 2022-05-24 ENCOUNTER — Telehealth: Payer: Self-pay | Admitting: *Deleted

## 2022-05-24 ENCOUNTER — Inpatient Hospital Stay: Payer: PRIVATE HEALTH INSURANCE

## 2022-05-24 VITALS — BP 154/73 | HR 70 | Temp 98.1°F | Resp 18

## 2022-05-24 DIAGNOSIS — D649 Anemia, unspecified: Secondary | ICD-10-CM

## 2022-05-24 DIAGNOSIS — N183 Chronic kidney disease, stage 3 unspecified: Secondary | ICD-10-CM

## 2022-05-24 DIAGNOSIS — D508 Other iron deficiency anemias: Secondary | ICD-10-CM

## 2022-05-24 DIAGNOSIS — D631 Anemia in chronic kidney disease: Secondary | ICD-10-CM | POA: Diagnosis not present

## 2022-05-24 DIAGNOSIS — D500 Iron deficiency anemia secondary to blood loss (chronic): Secondary | ICD-10-CM

## 2022-05-24 LAB — SAMPLE TO BLOOD BANK

## 2022-05-24 LAB — CBC WITH DIFFERENTIAL (CANCER CENTER ONLY)
Abs Immature Granulocytes: 0.03 10*3/uL (ref 0.00–0.07)
Basophils Absolute: 0 10*3/uL (ref 0.0–0.1)
Basophils Relative: 1 %
Eosinophils Absolute: 0.3 10*3/uL (ref 0.0–0.5)
Eosinophils Relative: 4 %
HCT: 28 % — ABNORMAL LOW (ref 36.0–46.0)
Hemoglobin: 8.5 g/dL — ABNORMAL LOW (ref 12.0–15.0)
Immature Granulocytes: 1 %
Lymphocytes Relative: 13 %
Lymphs Abs: 0.8 10*3/uL (ref 0.7–4.0)
MCH: 28.1 pg (ref 26.0–34.0)
MCHC: 30.4 g/dL (ref 30.0–36.0)
MCV: 92.4 fL (ref 80.0–100.0)
Monocytes Absolute: 0.5 10*3/uL (ref 0.1–1.0)
Monocytes Relative: 8 %
Neutro Abs: 4.7 10*3/uL (ref 1.7–7.7)
Neutrophils Relative %: 73 %
Platelet Count: 183 10*3/uL (ref 150–400)
RBC: 3.03 MIL/uL — ABNORMAL LOW (ref 3.87–5.11)
RDW: 17 % — ABNORMAL HIGH (ref 11.5–15.5)
WBC Count: 6.4 10*3/uL (ref 4.0–10.5)
nRBC: 0 % (ref 0.0–0.2)

## 2022-05-24 LAB — CMP (CANCER CENTER ONLY)
ALT: 6 U/L (ref 0–44)
AST: 12 U/L — ABNORMAL LOW (ref 15–41)
Albumin: 3.6 g/dL (ref 3.5–5.0)
Alkaline Phosphatase: 91 U/L (ref 38–126)
Anion gap: 6 (ref 5–15)
BUN: 54 mg/dL — ABNORMAL HIGH (ref 8–23)
CO2: 22 mmol/L (ref 22–32)
Calcium: 9.2 mg/dL (ref 8.9–10.3)
Chloride: 109 mmol/L (ref 98–111)
Creatinine: 4.35 mg/dL (ref 0.44–1.00)
GFR, Estimated: 11 mL/min — ABNORMAL LOW (ref >60)
Glucose, Bld: 129 mg/dL — ABNORMAL HIGH (ref 70–99)
Potassium: 5.5 mmol/L — ABNORMAL HIGH (ref 3.5–5.1)
Sodium: 137 mmol/L (ref 135–145)
Total Bilirubin: 0.4 mg/dL (ref 0.3–1.2)
Total Protein: 7.8 g/dL (ref 6.5–8.1)

## 2022-05-24 LAB — RETICULOCYTES
Immature Retic Fract: 26.2 % — ABNORMAL HIGH (ref 2.3–15.9)
RBC.: 3.11 MIL/uL — ABNORMAL LOW (ref 3.87–5.11)
Retic Count, Absolute: 129.4 10*3/uL (ref 19.0–186.0)
Retic Ct Pct: 4.2 % — ABNORMAL HIGH (ref 0.4–3.1)

## 2022-05-24 LAB — FERRITIN: Ferritin: 251 ng/mL (ref 11–307)

## 2022-05-24 MED ORDER — EPOETIN ALFA-EPBX 40000 UNIT/ML IJ SOLN
40000.0000 [IU] | Freq: Once | INTRAMUSCULAR | Status: AC
  Administered 2022-05-24: 40000 [IU] via SUBCUTANEOUS
  Filled 2022-05-24: qty 1

## 2022-05-24 NOTE — Telephone Encounter (Signed)
Critical Creatinine 4.35 reported by Nehemiah Settle in lab.  Dr Marin Olp notified.  No orders received

## 2022-05-24 NOTE — Progress Notes (Signed)
Hematology and Oncology Follow Up Visit  [name] [name] [id number] [birth year] 66 y.o. 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 She missed a couple of lab checks and injections since we last saw her. Her Hgb is 8.5.  She notes fatigue and states that she is stressed for her upcoming back surgery. Her appointment was moved up several weeks in August and she feels that she wants to wait until later in the month.  She has occasional chest tightness and palpitations which she feels may be due to anxiety.  No fever, chills, n/v, cough, rash, dizziness, SOB, abdominal pain or changes in bowel or bladder habits.  She has had success with the wound clinic and states that she now only has a very small wound on her left heel that is close to closing up.  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  She gets around nicely on her motorized scooter.  No falls or syncope to report.  Appetite comes and goes. She is doing her best to stay well hydrated. She did not want to stand for a weight today.   ECOG Performance Status: 2 - Symptomatic, <50% confined to bed  Medications:  Allergies as of 05/24/2022       Reactions   Bee Pollen Anaphylaxis   Fish-derived Products Hives, Shortness Of Breath, Swelling, Rash   Hives get in throat causing trouble breathing   Mushroom Extract Complex Anaphylaxis   Penicillins Anaphylaxis   **Tolerated cefepime March 2021 Did it involve swelling of the face/tongue/throat, SOB, or low BP? Yes Did it involve sudden or severe rash/hives, skin peeling, or any reaction on the inside of your mouth or  nose? No Did you need to seek medical attention at a hospital or doctor's office? Yes When did it last happen?  A few months ago If all above answers are "NO", may proceed with cephalosporin use.   Rosemary Oil Anaphylaxis   Shellfish Allergy Hives, Shortness Of Breath, Swelling, Rash   Tomato Hives, Shortness Of Breath   Hives in throat causes her trouble breathing   Acetaminophen Other (See Comments)   GI upset   Aloe Vera Hives   Broccoli [brassica Oleracea] Hives   Acyclovir And Related Other (See Comments)   Unknown reaction   Naproxen Other (See Comments)   Unknown reaction        Medication List        Accurate as of May 24, 2022  1:43 PM. If you have any questions, ask your nurse or doctor.          acetaminophen 325 MG tablet Commonly known as: TYLENOL Take 2 tablets (650 mg total) by mouth every 6 (six) hours as needed for mild pain, fever or headache (or Fever >/= 101). Discontinue 500/1000 mg dosing   Advair HFA 115-21 MCG/ACT inhaler Generic drug: fluticasone-salmeterol Inhale 2 puffs into the lungs 2 (two) times daily.   albuterol (2.5 MG/3ML) 0.083% nebulizer solution Commonly known as: PROVENTIL Take 3 mLs (2.5 mg total) by nebulization every 6 (six) hours as needed for wheezing or shortness of breath.  albuterol 108 (90 Base) MCG/ACT inhaler Commonly known as: VENTOLIN HFA Inhale 2 puffs into the lungs every 6 (six) hours as needed for wheezing or shortness of breath.   apixaban 2.5 MG Tabs tablet Commonly known as: Eliquis Take 1 tablet (2.5 mg total) by mouth 2 (two) times daily.   atorvastatin 10 MG tablet Commonly known as: LIPITOR Take 1 tablet (10 mg total) by mouth daily.   carvedilol 6.25 MG tablet Commonly known as: COREG Take 1 tablet (6.25 mg total) by mouth 2 (two) times daily with a meal.   diazepam 5 MG tablet Commonly known as: VALIUM Take 1 tablet (5 mg total) by mouth every 12 (twelve) hours as needed for anxiety.    DULoxetine 60 MG capsule Commonly known as: CYMBALTA Take 1 capsule by mouth once daily   ferrous sulfate 325 (65 FE) MG tablet Take 1 tablet (325 mg total) by mouth 2 (two) times daily with a meal.   folic acid 1 MG tablet Commonly known as: FOLVITE Take 2 tablets by mouth once daily   FreeStyle Libre 14 Day Reader Kerrin Mo 1 Device by Does not apply route daily.   FreeStyle Libre 14 Day Sensor Misc 1 Device by Does not apply route daily.   furosemide 20 MG tablet Commonly known as: LASIX Take 2 tablets (40 mg total) by mouth daily.   HYDROcodone-acetaminophen 5-325 MG tablet Commonly known as: NORCO/VICODIN Take 1 tablet by mouth every 6 (six) hours as needed for moderate pain.   Lantus SoloStar 100 UNIT/ML Solostar Pen Generic drug: insulin glargine INJECT 20 UNITS SUBCUTANEOUSLY IN THE MORNING AND 40 AT NIGHT What changed:  how much to take when to take this additional instructions   lubiprostone 24 MCG capsule Commonly known as: AMITIZA Take 1 capsule (24 mcg total) by mouth 2 (two) times daily with a meal.   montelukast 10 MG tablet Commonly known as: SINGULAIR Take 1 tablet (10 mg total) by mouth at bedtime.   multivitamin with minerals Tabs tablet Take 1 tablet by mouth daily.   NONFORMULARY OR COMPOUNDED ITEM Power wheelchair  #1  as directed   ondansetron 8 MG tablet Commonly known as: ZOFRAN TAKE 1/2 (ONE-HALF) TABLET BY MOUTH EVERY 8 HOURS AS NEEDED FOR NAUSEA FOR VOMITING   pregabalin 75 MG capsule Commonly known as: LYRICA Take 1 capsule (75 mg total) by mouth 3 (three) times daily.   prochlorperazine 10 MG tablet Commonly known as: COMPAZINE Take 1 tablet (10 mg total) by mouth 2 (two) times daily as needed for nausea or vomiting.   VITAMIN D PO Take 125 mcg by mouth daily.        Allergies:  Allergies  Allergen Reactions   Bee Pollen Anaphylaxis   Fish-Derived Products Hives, Shortness Of Breath, Swelling and Rash    Hives get in  throat causing trouble breathing   Mushroom Extract Complex Anaphylaxis   Penicillins Anaphylaxis    **Tolerated cefepime March 2021 Did it involve swelling of the face/tongue/throat, SOB, or low BP? Yes Did it involve sudden or severe rash/hives, skin peeling, or any reaction on the inside of your mouth or nose? No Did you need to seek medical attention at a hospital or doctor's office? Yes When did it last happen?  A few months ago If all above answers are "NO", may proceed with cephalosporin use.   Rosemary Oil Anaphylaxis   Shellfish Allergy Hives, Shortness Of Breath, Swelling and Rash   Tomato Hives and Shortness Of Breath  Hives in throat causes her trouble breathing   Acetaminophen Other (See Comments)    GI upset   Aloe Vera Hives   Broccoli [Brassica Oleracea] Hives   Acyclovir And Related Other (See Comments)    Unknown reaction   Naproxen Other (See Comments)    Unknown reaction    Past Medical History, Surgical history, Social history, and Family History were reviewed and updated.  Review of Systems: All other 10 point review of systems is negative.   Physical Exam:  oral temperature is 98.1 F (36.7 C). Her blood pressure is 154/73 (abnormal) and her pulse is 70. Her respiration is 18 and oxygen saturation is 100%.   Wt Readings from Last 3 Encounters:  03/25/22 160 lb (72.6 kg)  02/14/22 168 lb (76.2 kg)  01/06/22 169 lb (76.7 kg)    Ocular: Sclerae unicteric, pupils equal, round and reactive to light Ear-nose-throat: Oropharynx clear, dentition fair Lymphatic: No cervical or supraclavicular adenopathy Lungs no rales or rhonchi, good excursion bilaterally Heart regular rate and rhythm, no murmur appreciated Abd soft, nontender, positive bowel sounds MSK no focal spinal tenderness, no joint edema Neuro: non-focal, well-oriented, appropriate affect Breasts: Deferred   Lab Results  Component Value Date   WBC 6.4 05/24/2022   HGB 8.5 (L) 05/24/2022    HCT 28.0 (L) 05/24/2022   MCV 92.4 05/24/2022   PLT 183 05/24/2022   Lab Results  Component Value Date   FERRITIN 349 (H) 05/16/2022   IRON 37 05/16/2022   TIBC 259 05/16/2022   UIBC 222 05/16/2022   IRONPCTSAT 14 05/16/2022   Lab Results  Component Value Date   RETICCTPCT 4.2 (H) 05/24/2022   RBC 3.11 (L) 05/24/2022   No results found for: "KPAFRELGTCHN", "LAMBDASER", "KAPLAMBRATIO" No results found for: "IGGSERUM", "IGA", "IGMSERUM" No results found for: "TOTALPROTELP", "ALBUMINELP", "A1GS", "A2GS", "BETS", "BETA2SER", "GAMS", "MSPIKE", "SPEI"   Chemistry      Component Value Date/Time   NA 135 05/04/2022 1356   K 5.7 (H) 05/04/2022 1356   CL 106 05/04/2022 1356   CO2 22 05/04/2022 1356   BUN 57 (H) 05/04/2022 1356   CREATININE 3.60 (H) 05/04/2022 1356   CREATININE 3.88 (HH) 03/22/2022 1300   CREATININE 1.97 (H) 06/24/2020 1431      Component Value Date/Time   CALCIUM 8.7 05/04/2022 1356   ALKPHOS 96 05/04/2022 1356   AST 10 05/04/2022 1356   AST 12 (L) 03/22/2022 1300   ALT 5 05/04/2022 1356   ALT 8 03/22/2022 1300   BILITOT 0.4 05/04/2022 1356   BILITOT 0.4 03/22/2022 1300       Impression and Plan: [name] [name] is a very pleasant 66 yo caucasian female with long standing history of iron deficiency anemia as well as history of DVT's in both the right and left peroneal and posterior tibial veins while she was bed bound in the hospital being treated for osteomyelitis.  She had an elevated homocystine level, mildly elevated anticardiolipin antibody IgG level and elevated D-dimer as well with lab work.  ESA given, Hgb 8.5.  Iron studies are pending. We will replace if needed.  LAb and injection every 3 weeks, follow-up in 9 weeks.   Lottie Dawson, NP 7/26/20231:43 PM

## 2022-05-24 NOTE — Patient Instructions (Signed)
Epoetin Alfa injection ?What is this medication? ?EPOETIN ALFA (e POE e tin AL fa) helps your body make more red blood cells. This medicine is used to treat anemia caused by chronic kidney disease, cancer chemotherapy, or HIV-therapy. It may also be used before surgery if you have anemia. ?This medicine may be used for other purposes; ask your health care provider or pharmacist if you have questions. ?COMMON BRAND NAME(S): Epogen, Procrit, Retacrit ?What should I tell my care team before I take this medication? ?They need to know if you have any of these conditions: ?cancer ?heart disease ?high blood pressure ?history of blood clots ?history of stroke ?low levels of folate, iron, or vitamin B12 in the blood ?seizures ?an unusual or allergic reaction to erythropoietin, albumin, benzyl alcohol, hamster proteins, other medicines, foods, dyes, or preservatives ?pregnant or trying to get pregnant ?breast-feeding ?How should I use this medication? ?This medicine is for injection into a vein or under the skin. It is usually given by a health care professional in a hospital or clinic setting. ?If you get this medicine at home, you will be taught how to prepare and give this medicine. Use exactly as directed. Take your medicine at regular intervals. Do not take your medicine more often than directed. ?It is important that you put your used needles and syringes in a special sharps container. Do not put them in a trash can. If you do not have a sharps container, call your pharmacist or healthcare provider to get one. ?A special MedGuide will be given to you by the pharmacist with each prescription and refill. Be sure to read this information carefully each time. ?Talk to your pediatrician regarding the use of this medicine in children. While this drug may be prescribed for selected conditions, precautions do apply. ?Overdosage: If you think you have taken too much of this medicine contact a poison control center or emergency  room at once. ?NOTE: This medicine is only for you. Do not share this medicine with others. ?What if I miss a dose? ?If you miss a dose, take it as soon as you can. If it is almost time for your next dose, take only that dose. Do not take double or extra doses. ?What may interact with this medication? ?Interactions have not been studied. ?This list may not describe all possible interactions. Give your health care provider a list of all the medicines, herbs, non-prescription drugs, or dietary supplements you use. Also tell them if you smoke, drink alcohol, or use illegal drugs. Some items may interact with your medicine. ?What should I watch for while using this medication? ?Your condition will be monitored carefully while you are receiving this medicine. ?You may need blood work done while you are taking this medicine. ?This medicine may cause a decrease in vitamin B6. You should make sure that you get enough vitamin B6 while you are taking this medicine. Discuss the foods you eat and the vitamins you take with your health care professional. ?What side effects may I notice from receiving this medication? ?Side effects that you should report to your doctor or health care professional as soon as possible: ?allergic reactions like skin rash, itching or hives, swelling of the face, lips, or tongue ?seizures ?signs and symptoms of a blood clot such as breathing problems; changes in vision; chest pain; severe, sudden headache; pain, swelling, warmth in the leg; trouble speaking; sudden numbness or weakness of the face, arm or leg ?signs and symptoms of a stroke like   changes in vision; confusion; trouble speaking or understanding; severe headaches; sudden numbness or weakness of the face, arm or leg; trouble walking; dizziness; loss of balance or coordination ?Side effects that usually do not require medical attention (report to your doctor or health care professional if they continue or are  bothersome): ?chills ?cough ?dizziness ?fever ?headaches ?joint pain ?muscle cramps ?muscle pain ?nausea, vomiting ?pain, redness, or irritation at site where injected ?This list may not describe all possible side effects. Call your doctor for medical advice about side effects. You may report side effects to FDA at 1-800-FDA-1088. ?Where should I keep my medication? ?Keep out of the reach of children. ?Store in a refrigerator between 2 and 8 degrees C (36 and 46 degrees F). Do not freeze or shake. Throw away any unused portion if using a single-dose vial. Multi-dose vials can be kept in the refrigerator for up to 21 days after the initial dose. Throw away unused medicine. ?NOTE: This sheet is a summary. It may not cover all possible information. If you have questions about this medicine, talk to your doctor, pharmacist, or health care provider. ?? 2023 Elsevier/Gold Standard (2017-06-19 00:00:00) ? ?

## 2022-05-25 ENCOUNTER — Encounter: Payer: Self-pay | Admitting: Family Medicine

## 2022-05-25 ENCOUNTER — Ambulatory Visit (INDEPENDENT_AMBULATORY_CARE_PROVIDER_SITE_OTHER): Payer: PRIVATE HEALTH INSURANCE | Admitting: Family Medicine

## 2022-05-25 ENCOUNTER — Other Ambulatory Visit (HOSPITAL_COMMUNITY)
Admission: RE | Admit: 2022-05-25 | Discharge: 2022-05-25 | Disposition: A | Discharge status: Home / Self Care | Payer: PRIVATE HEALTH INSURANCE | Source: Ambulatory Visit | Attending: Family Medicine | Admitting: Family Medicine

## 2022-05-25 VITALS — BP 132/78 | HR 66 | Temp 98.8°F | Resp 18 | Ht 61.0 in

## 2022-05-25 DIAGNOSIS — R300 Dysuria: Secondary | ICD-10-CM | POA: Diagnosis not present

## 2022-05-25 DIAGNOSIS — N898 Other specified noninflammatory disorders of vagina: Secondary | ICD-10-CM | POA: Diagnosis present

## 2022-05-25 DIAGNOSIS — M5136 Other intervertebral disc degeneration, lumbar region: Secondary | ICD-10-CM | POA: Diagnosis not present

## 2022-05-25 DIAGNOSIS — E2839 Other primary ovarian failure: Secondary | ICD-10-CM | POA: Diagnosis not present

## 2022-05-25 DIAGNOSIS — F418 Other specified anxiety disorders: Secondary | ICD-10-CM | POA: Diagnosis not present

## 2022-05-25 LAB — IRON AND IRON BINDING CAPACITY (CC-WL,HP ONLY)
Iron: 34 ug/dL (ref 28–170)
Saturation Ratios: 13 % (ref 10.4–31.8)
TIBC: 260 ug/dL (ref 250–450)
UIBC: 226 ug/dL (ref 148–442)

## 2022-05-25 MED ORDER — DIAZEPAM 5 MG PO TABS: 5.0000 mg | ORAL_TABLET | Freq: Two times a day (BID) | ORAL | 0 refills | Status: DC | PRN

## 2022-05-25 NOTE — Assessment & Plan Note (Signed)
Ortho wants pt on evenity We need a bmd as well

## 2022-05-25 NOTE — Patient Instructions (Signed)
Chronic Back Pain When back pain lasts longer than 3 months, it is called chronic back pain. The cause of your back pain may not be known. Some common causes include: Wear and tear (degenerative disease) of the bones, ligaments, or disks in your back. Inflammation and stiffness in your back (arthritis). People who have chronic back pain often go through certain periods in which the pain is more intense (flare-ups). Many people can learn to manage the pain with home care. Follow these instructions at home: Pay attention to any changes in your symptoms. Take these actions to help with your pain: Managing pain and stiffness     If directed, apply ice to the painful area. Your health care provider may recommend applying ice during the first 24-48 hours after a flare-up begins. To do this: Put ice in a plastic bag. Place a towel between your skin and the bag. Leave the ice on for 20 minutes, 2-3 times per day. If directed, apply heat to the affected area as often as told by your health care provider. Use the heat source that your health care provider recommends, such as a moist heat pack or a heating pad. Place a towel between your skin and the heat source. Leave the heat on for 20-30 minutes. Remove the heat if your skin turns bright red. This is especially important if you are unable to feel pain, heat, or cold. You may have a greater risk of getting burned. Try soaking in a warm tub. Activity  Avoid bending and other activities that make the problem worse. Maintain a proper position when standing or sitting: When standing, keep your upper back and neck straight, with your shoulders pulled back. Avoid slouching. When sitting, keep your back straight and relax your shoulders. Do not round your shoulders or pull them backward. Do not sit or stand in one place for long periods of time. Take brief periods of rest throughout the day. This will reduce your pain. Resting in a lying or standing  position is usually better than sitting to rest. When you are resting for longer periods, mix in some mild activity or stretching between periods of rest. This will help to prevent stiffness and pain. Get regular exercise. Ask your health care provider what activities are safe for you. Do not lift anything that is heavier than 10 lb (4.5 kg), or the limit that you are told, until your health care provider says that it is safe. Always use proper lifting technique, which includes: Bending your knees. Keeping the load close to your body. Avoiding twisting. Sleep on a firm mattress in a comfortable position. Try lying on your side with your knees slightly bent. If you lie on your back, put a pillow under your knees. Medicines Treatment may include medicines for pain and inflammation taken by mouth or applied to the skin, prescription pain medicine, or muscle relaxants. Take over-the-counter and prescription medicines only as told by your health care provider. Ask your health care provider if the medicine prescribed to you: Requires you to avoid driving or using machinery. Can cause constipation. You may need to take these actions to prevent or treat constipation: Drink enough fluid to keep your urine pale yellow. Take over-the-counter or prescription medicines. Eat foods that are high in fiber, such as beans, whole grains, and fresh fruits and vegetables. Limit foods that are high in fat and processed sugars, such as fried or sweet foods. General instructions Do not use any products that contain nicotine or  tobacco, such as cigarettes, e-cigarettes, and chewing tobacco. If you need help quitting, ask your health care provider. Keep all follow-up visits as told by your health care provider. This is important. Contact a health care provider if: You have pain that is not relieved with rest or medicine. Your pain gets worse, or you have new pain. You have a high fever. You have rapid weight  loss. You have trouble doing your normal activities. Get help right away if: You have weakness or numbness in one or both of your legs or feet. You have trouble controlling your bladder or your bowels. You have severe back pain and have any of the following: Nausea or vomiting. Pain in your abdomen. Shortness of breath or you faint. Summary Chronic back pain is back pain that lasts longer than 3 months. When a flare-up begins, apply ice to the painful area for the first 24-48 hours. Apply a moist heat pad or use a heating pad on the painful area as directed by your health care provider. When you are resting for longer periods, mix in some mild activity or stretching between periods of rest. This will help to prevent stiffness and pain. This information is not intended to replace advice given to you by your health care provider. Make sure you discuss any questions you have with your health care provider. Document Revised: 11/26/2019 Document Reviewed: 11/26/2019 Elsevier Patient Education  [place].

## 2022-05-25 NOTE — Assessment & Plan Note (Signed)
Per [place] neuro  Pt wants to put off surgery until next month
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
## 2022-05-26 ENCOUNTER — Encounter: Payer: Self-pay | Admitting: Family

## 2022-05-26 ENCOUNTER — Telehealth: Payer: Self-pay | Admitting: Family Medicine

## 2022-05-26 DIAGNOSIS — G894 Chronic pain syndrome: Secondary | ICD-10-CM

## 2022-05-26 DIAGNOSIS — Z981 Arthrodesis status: Secondary | ICD-10-CM

## 2022-05-26 MED ORDER — DULOXETINE HCL 60 MG PO CPEP: 60.0000 mg | ORAL_CAPSULE | Freq: Every day | ORAL | 0 refills | Status: DC

## 2022-05-26 NOTE — Telephone Encounter (Signed)
Refill sent.

## 2022-05-26 NOTE — Telephone Encounter (Signed)
Medication: DULoxetine (CYMBALTA) 60 MG capsule  Has the patient contacted their pharmacy? Yes.   Pharmacy told pt that they need a new script for it  Preferred Pharmacy (with phone number or street name):   [facility] [address] [place], [place] - [address]  [address], [place] [address]  Phone:  [phone]  Fax:  [phone]  Agent: Please be advised that RX refills may take up to 3 business days. We ask that you follow-up with your pharmacy.

## 2022-05-26 NOTE — Chronic Care Management (AMB) (Signed)
  Chronic Care Management   Note  05/26/2022 Name: Maleeka Sabatino MRN: [id number] DOB: 1956-10-11  Lyzette Delaine [name] is a 66 y.o. year old adult who is a primary care patient of Ann Held, DO. I reached out to [employer] by phone today in response to a referral sent by Ms. [place] Christoffersen's PCP.  Ms. Giannelli was given information about Chronic Care Management services today including:  CCM service includes personalized support from designated clinical staff supervised by her physician, including individualized plan of care and coordination with other care providers 24/7 contact phone numbers for assistance for urgent and routine care needs. Service will only be billed when office clinical staff spend 20 minutes or more in a month to coordinate care. Only one practitioner may furnish and bill the service in a calendar month. The patient may stop CCM services at any time (effective at the end of the month) by phone call to the office staff. The patient is responsible for co-pay (up to 20% after annual deductible is met) if co-pay is required by the individual health plan.   Patient agreed to services and verbal consent obtained.   Follow up plan: Telephone appointment with care management team member scheduled for: 06/02/2022  Julian Hy, [place] Direct Dial: [phone]

## 2022-05-29 LAB — CERVICOVAGINAL ANCILLARY ONLY
Chlamydia: NEGATIVE
Comment: NEGATIVE
Comment: NEGATIVE
Comment: NORMAL
Neisseria Gonorrhea: NEGATIVE
Trichomonas: NEGATIVE

## 2022-05-30 ENCOUNTER — Inpatient Hospital Stay (HOSPITAL_BASED_OUTPATIENT_CLINIC_OR_DEPARTMENT_OTHER): Admission: RE | Admit: 2022-05-30 | Payer: PRIVATE HEALTH INSURANCE | Source: Ambulatory Visit

## 2022-05-31 ENCOUNTER — Encounter (HOSPITAL_COMMUNITY): Admission: RE | Payer: Self-pay | Source: Home / Self Care

## 2022-05-31 ENCOUNTER — Inpatient Hospital Stay (HOSPITAL_COMMUNITY): Admission: RE | Admit: 2022-05-31 | Payer: PRIVATE HEALTH INSURANCE | Source: Home / Self Care

## 2022-05-31 SURGERY — POSTERIOR LUMBAR FUSION 2 LEVEL
Anesthesia: General

## 2022-06-02 ENCOUNTER — Telehealth: Payer: PRIVATE HEALTH INSURANCE

## 2022-06-02 ENCOUNTER — Other Ambulatory Visit: Payer: Self-pay | Admitting: Family Medicine

## 2022-06-02 ENCOUNTER — Encounter: Payer: Self-pay | Admitting: Family

## 2022-06-02 DIAGNOSIS — G894 Chronic pain syndrome: Secondary | ICD-10-CM

## 2022-06-02 DIAGNOSIS — M8610 Other acute osteomyelitis, unspecified site: Secondary | ICD-10-CM

## 2022-06-02 DIAGNOSIS — Z981 Arthrodesis status: Secondary | ICD-10-CM

## 2022-06-02 MED ORDER — HYDROCODONE-ACETAMINOPHEN 5-325 MG PO TABS: 1.0000 | ORAL_TABLET | Freq: Four times a day (QID) | ORAL | 0 refills | Status: DC | PRN

## 2022-06-02 NOTE — Telephone Encounter (Signed)
Requesting: [facility]CO Contract: 02/14/2022 UDS: 02/11/2022 Last OV: 05/04/2022 Next OV: N/A Last Refill: 05/04/2022, #[id number] RF Database:   Please advise

## 2022-06-02 NOTE — Telephone Encounter (Signed)
Medication: HYDROcodone-acetaminophen (NORCO/VICODIN) 5-325 MG tablet  Has the patient contacted their pharmacy? No.  Preferred Pharmacy (with phone number or street name):  [facility] [address] [place], [place] - [address]  [address] [place] [address]  Phone:  [phone]  Fax:  [phone]   Agent: Please be advised that RX refills may take up to 3 business days. We ask that you follow-up with your pharmacy.

## 2022-06-05 ENCOUNTER — Telehealth: Payer: PRIVATE HEALTH INSURANCE

## 2022-06-05 ENCOUNTER — Telehealth: Payer: Self-pay | Admitting: Pharmacist

## 2022-06-05 NOTE — Chronic Care Management (AMB) (Signed)
Chronic Care Management Pharmacy Assistant   Name: [name] [name]  MRN: [id number] DOB: [birth year]  [name] [name] is an 66 y.o. year old adult who presents for his initial CCM visit with the clinical pharmacist.  Recent office visits:  05/25/22-Yvonne Gibson Ramp, DO (PCP) Seen for surgical questions. Bone density ordered. 05/04/22-Yvonne RCarollee Herter, DO (PCP) Seen for follow up visit and constipation. Labs ordered. AMB Referral to [facility] Start on lubiprostone (AMITIZA) 24 MCG capsule. Follow up in 3 months. 04/20/22-Taylor B. Olevia Bowens, NP. Seen for abdominal pain. Abdominal xray ordered. Follow up in 2 weeks. 02/14/22-Yvonne RCarollee Herter, DO (PCP) Seen for general follow up visit. Labs ordered. Amb ref to [facility]. Follow up in 3 months. 12/01/21-Yvonne RCarollee Herter, DO (PCP) Wheelchair Evaluation. AMB Referral to [facility]. Labs ordered. Labs ordered. Follow up in 6 months.  Recent consult visits:  05/24/22-Sarah Kandis Cocking, NP (Oncology) Follow up visit. Follow up in 9 weeks. 05/19/22-Jonathan Marcello Moores, MD (Neurosurgery) Notes not available. 05/12/22-Michael Rozetta Nunnery, MD (Wound care)  05/01/22-Jonathan G. Marcello Moores, MD (Neurosurgery) Notes not available. 04/12/22-Bartlett Margaree Mackintosh, MD (Ophthalmology) Seen for retina evaluation. Start on bevacizumab (AVASTIN) syringe 1.25 mg and proparacaine (ALCAINE) 0.5 % ophthalmic solution 1 drop . Follow up in 4 weeks. 03/31/22-Jennifer Emi Belfast, MD (Wound care)  03/22/22-Sarah Kandis Cocking, NP (Oncology) Follow up visit. Follow up in 9 weeks. 03/15/22-Bartlett Margaree Mackintosh, MD (Ophthalmology) Seen for post-op exam. Follow up in 3 weeks. 03/10/22-Jennifer Celine Ahr, MD (Wound care)  03/08/22-Bartlett Margaree Mackintosh, MD (Ophthalmology) Seen for post-op exam. Start on Dorzolamide (TRUSOPT) 2 % ophthalmic solution. Follow up in 1 week. 03/03/22-Sohmer, Towana Badger, PA-C (Ophthalmology)  Pre-op exam for right eye cataract surgery and retina surgery. 02/21/22-John Megan Salon, MD (Infectious disease)  02/10/22-Jennifer Celine Ahr, MD (Wound care)  02/08/22-Sarah Eulas Post, NP (Oncology) Follow up visit. Follow up in 6 weeks. 02/02/22-Jennifer Celine Ahr, NP (Wound care)  01/25/22-Jennifer Celine Ahr, NP (Wound care) 01/24/22-John Megan Salon, MD (Infectious disease) Follow up visit. Follow up in 4 weeks. 01/19/22-Jennifer Celine Ahr, MD (Wound care)  01/03/22-Jennifer Celine Ahr, MD (Wound care)  12/21/21-Sarah Kandis Cocking, NP (Oncology) Follow up visit. Follow up in 6 weeks. 12/20/21-John Megan Salon, MD (Infectious disease)   Hospital visits: Admitted to the hospital on 03/25/22 due to abdominal pain. Discharge date was 03/26/22. Discharged from [facility]    New?Medications Started at [facility] Discharge:?? -started  prochlorperazine (COMPAZINE) 10 MG tablet  2 times daily PRN  Medication Changes at Hospital Discharge: -Changed None noted  Medications Discontinued at Hospital Discharge: -Stopped none noted  Medications that remain the same after Hospital Discharge:??  -All other medications will remain the same.      Admitted to the hospital on 01/06/22 due to Chest pain. Discharge date was 01/09/22. Discharged from [facility]?Medications Started at [facility] Discharge:?? -started None noted  Medication Changes at Hospital Discharge: -Changed None noted  Medications Discontinued at Hospital Discharge: -Stopped None noted  Medications that remain the same after Hospital Discharge:??  -All other medications will remain the same.    Medications: Outpatient Encounter Medications as of 06/05/2022  Medication Sig Note   acetaminophen (TYLENOL) 325 MG tablet Take 2 tablets (650 mg total) by mouth every 6 (six) hours as needed for mild pain, fever or headache (or Fever >/= 101). Discontinue 500/1000 mg dosing    albuterol (PROVENTIL) (2.5 MG/3ML) 0.083% nebulizer  solution Take 3 mLs (2.5 mg total) by nebulization every 6 (six)  hours as needed for wheezing or shortness of breath.    albuterol (VENTOLIN HFA) 108 (90 Base) MCG/ACT inhaler Inhale 2 puffs into the lungs every 6 (six) hours as needed for wheezing or shortness of breath.    apixaban (ELIQUIS) 2.5 MG TABS tablet Take 1 tablet (2.5 mg total) by mouth 2 (two) times daily.    atorvastatin (LIPITOR) 10 MG tablet Take 1 tablet (10 mg total) by mouth daily.    carvedilol (COREG) 6.25 MG tablet Take 1 tablet (6.25 mg total) by mouth 2 (two) times daily with a meal.    Continuous Blood Gluc Receiver (FREESTYLE LIBRE 14 DAY READER) DEVI 1 Device by Does not apply route daily.    Continuous Blood Gluc Sensor (FREESTYLE LIBRE 14 DAY SENSOR) MISC 1 Device by Does not apply route daily.    diazepam (VALIUM) 5 MG tablet Take 1 tablet (5 mg total) by mouth every 12 (twelve) hours as needed for anxiety.    DULoxetine (CYMBALTA) 60 MG capsule Take 1 capsule (60 mg total) by mouth daily.    ferrous sulfate 325 (65 FE) MG tablet Take 1 tablet (325 mg total) by mouth 2 (two) times daily with a meal.    fluticasone-salmeterol (ADVAIR HFA) 115-21 MCG/ACT inhaler Inhale 2 puffs into the lungs 2 (two) times daily.    folic acid (FOLVITE) 1 MG tablet Take 2 tablets by mouth once daily (Patient taking differently: Take 2 mg by mouth daily.) 01/08/2022: Pt's husband reported that the pt has been out of this medication for roughly a week, and is in need of more refills.   furosemide (LASIX) 20 MG tablet Take 2 tablets (40 mg total) by mouth daily.    HYDROcodone-acetaminophen (NORCO/VICODIN) 5-325 MG tablet Take 1 tablet by mouth every 6 (six) hours as needed for moderate pain.    insulin glargine (LANTUS SOLOSTAR) 100 UNIT/ML Solostar Pen INJECT 20 UNITS SUBCUTANEOUSLY IN THE MORNING AND 40 AT NIGHT (Patient taking differently: 15-18 Units at bedtime.)    lubiprostone (AMITIZA) 24 MCG capsule Take 1 capsule (24 mcg total) by  mouth 2 (two) times daily with a meal.    montelukast (SINGULAIR) 10 MG tablet Take 1 tablet (10 mg total) by mouth at bedtime.    Multiple Vitamin (MULTIVITAMIN WITH MINERALS) TABS tablet Take 1 tablet by mouth daily.    NONFORMULARY OR COMPOUNDED ITEM Power wheelchair  #1  as directed    ondansetron (ZOFRAN) 8 MG tablet TAKE 1/2 (ONE-HALF) TABLET BY MOUTH EVERY 8 HOURS AS NEEDED FOR NAUSEA FOR VOMITING    pregabalin (LYRICA) 75 MG capsule Take 1 capsule (75 mg total) by mouth 3 (three) times daily.    prochlorperazine (COMPAZINE) 10 MG tablet Take 1 tablet (10 mg total) by mouth 2 (two) times daily as needed for nausea or vomiting.    VITAMIN D PO Take 125 mcg by mouth daily.    [DISCONTINUED] metFORMIN (GLUCOPHAGE) 1000 MG tablet Take 1,000 mg by mouth 2 (two) times daily with a meal.      [DISCONTINUED] omeprazole (PRILOSEC) 20 MG capsule Take 20 mg by mouth daily.      No facility-administered encounter medications on file as of 06/05/2022.   acetaminophen (TYLENOL) 325 MG tablet Last filled:None noted albuterol (PROVENTIL) (2.5 MG/3ML) 0.083% nebulizer solution Last filled:03/12/22 25 DS albuterol (VENTOLIN HFA) 108 (90 Base) MCG/ACT inhaler Last filled:03/12/22 25 DS apixaban (ELIQUIS) 2.5 MG TABS tablet Last filled:04/24/22 30 DS atorvastatin (LIPITOR) 10 MG tablet Last filled:01/09/22 90 DS carvedilol (  COREG) 6.25 MG tablet Last filled:04/24/22 30 DS diazepam (VALIUM) 5 MG tablet Last filled:05/25/22 15 DS DULoxetine (CYMBALTA) 60 MG capsule Last filled:05/01/22 90 DS ferrous sulfate 325 (65 FE) MG tablet Last filled:None noted fluticasone-salmeterol (ADVAIR HFA) 115-21 MCG/ACT inhaler Last RAQTMA:26/33/35 30 DS folic acid (FOLVITE) 1 MG tablet Last filled:11/12/21 30 DS furosemide (LASIX) 20 MG tablet Last filled:04/24/22 90 DS HYDROcodone-acetaminophen (NORCO/VICODIN) 5-325 MG tablet Last filled:05/04/22 30 DS insulin glargine (LANTUS SOLOSTAR) 100 UNIT/ML Solostar Pen Last  filled:07/26/22 25 DS lubiprostone (AMITIZA) 24 MCG capsule Last filled:05/04/22 30 DS montelukast (SINGULAIR) 10 MG tablet Last filled:02/14/22 90 DS ondansetron (ZOFRAN) 8 MG tablet Last filled:05/01/22 14 DS pregabalin (LYRICA) 75 MG capsule Last filled:03/16/22 30 DS prochlorperazine (COMPAZINE) 10 MG tablet Last filled:03/26/22 5 DS   Care Gaps: Zoster Vaccines- Shingrix  COLONOSCOPY:Never done Pneumonia Vaccine:Last completed: Aug 10, 2014 URINE MICROALBUMIN:Last completed: Nov 26, 2018 MAMMOGRAM:Last completed: Dec 10, 2018 INFLUENZA VACCINE:Last completed: Aug 16, 2021  [facility] atorvastatin (LIPITOR) 10 MG tablet Last filled:01/09/22 90 DS insulin glargine (LANTUS SOLOSTAR) 100 UNIT/ML Solostar Pen Last filled:07/26/22 25 DS  Corrie Mckusick, [place]

## 2022-06-05 NOTE — Telephone Encounter (Signed)
  Care Management   Follow Up Note   06/05/2022 Name: [name] [name] MRN: [id number] DOB: 01/17/1956   Referred by: Ann Held, DO Reason for referral : No chief complaint on file.   An unsuccessful telephone outreach was attempted today. The patient was referred to the case management team for assistance with care management and care coordination.   Follow Up Plan: The care management team will reach out to the patient again over the next 30 days.   Cherre Robins, PharmD Clinical Pharmacist [facility]

## 2022-06-06 ENCOUNTER — Other Ambulatory Visit (HOSPITAL_BASED_OUTPATIENT_CLINIC_OR_DEPARTMENT_OTHER): Payer: PRIVATE HEALTH INSURANCE

## 2022-06-06 ENCOUNTER — Ambulatory Visit (HOSPITAL_BASED_OUTPATIENT_CLINIC_OR_DEPARTMENT_OTHER)
Admission: RE | Admit: 2022-06-06 | Discharge: 2022-06-06 | Disposition: A | Discharge status: Home / Self Care | Payer: PRIVATE HEALTH INSURANCE | Source: Ambulatory Visit | Attending: Family Medicine | Admitting: Family Medicine

## 2022-06-06 DIAGNOSIS — E2839 Other primary ovarian failure: Secondary | ICD-10-CM | POA: Diagnosis present

## 2022-06-08 ENCOUNTER — Encounter: Payer: Self-pay | Admitting: Family

## 2022-06-10 ENCOUNTER — Telehealth: Payer: Self-pay

## 2022-06-10 DIAGNOSIS — M810 Age-related osteoporosis without current pathological fracture: Secondary | ICD-10-CM

## 2022-06-10 NOTE — Telephone Encounter (Signed)
[place], DO  06/07/2022 11:36 AM EDT Back to Top    Osteoporosis---- would like to see if her insurance will cover evenity--- monthly injection for 1 year --- then switch to prolia  Recheck bmd 2 years Also take calcium 1200 mg daily either in diet or supplement and vita d3 1000 u daily
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
## 2022-06-12 NOTE — Chronic Care Management (AMB) (Signed)
  Chronic Care Management Note  06/12/2022 Name: [name] [name] MRN: [id number] DOB: Apr 25, 1956  [name] [name] is a 66 y.o. year old adult who is a primary care patient of Ann Held, DO and is actively engaged with the care management team. I reached out to [name] [name] by phone today to assist with re-scheduling an initial visit with the Pharmacist  Follow up plan: Unsuccessful telephone outreach attempt made. A HIPAA compliant phone message was left for the patient providing contact information and requesting a return call.   [name] [name], [place] Direct Dial: [phone]

## 2022-06-13 ENCOUNTER — Encounter (HOSPITAL_BASED_OUTPATIENT_CLINIC_OR_DEPARTMENT_OTHER): Payer: PRIVATE HEALTH INSURANCE | Attending: General Surgery | Admitting: General Surgery

## 2022-06-14 ENCOUNTER — Inpatient Hospital Stay: Payer: PRIVATE HEALTH INSURANCE

## 2022-06-14 ENCOUNTER — Inpatient Hospital Stay: Payer: PRIVATE HEALTH INSURANCE | Attending: Hematology & Oncology

## 2022-06-14 ENCOUNTER — Encounter: Admit: 2022-06-14 | Discharge: 2022-06-14 | Payer: MEDICARE

## 2022-06-14 ENCOUNTER — Ambulatory Visit: Admit: 2022-06-14 | Discharge: 2022-06-14 | Payer: MEDICARE

## 2022-06-14 ENCOUNTER — Ambulatory Visit: Admit: 2022-06-14 | Discharge: 2022-06-15 | Payer: MEDICARE

## 2022-06-14 DIAGNOSIS — N1832 Stage 3b chronic kidney disease (HCC): Secondary | ICD-10-CM

## 2022-06-14 DIAGNOSIS — M79642 Pain in left hand: Secondary | ICD-10-CM

## 2022-06-14 DIAGNOSIS — E559 Vitamin D deficiency, unspecified: Secondary | ICD-10-CM

## 2022-06-14 DIAGNOSIS — G9050 Complex regional pain syndrome I, unspecified: Secondary | ICD-10-CM

## 2022-06-14 DIAGNOSIS — E119 Type 2 diabetes mellitus without complications: Secondary | ICD-10-CM

## 2022-06-14 DIAGNOSIS — D649 Anemia, unspecified: Secondary | ICD-10-CM

## 2022-06-14 DIAGNOSIS — J309 Allergic rhinitis, unspecified: Secondary | ICD-10-CM

## 2022-06-14 DIAGNOSIS — J45909 Unspecified asthma, uncomplicated: Secondary | ICD-10-CM

## 2022-06-14 DIAGNOSIS — I10 Essential (primary) hypertension: Secondary | ICD-10-CM

## 2022-06-14 DIAGNOSIS — K2970 Gastritis, unspecified, without bleeding: Secondary | ICD-10-CM

## 2022-06-14 DIAGNOSIS — K589 Irritable bowel syndrome without diarrhea: Secondary | ICD-10-CM

## 2022-06-14 DIAGNOSIS — K5900 Constipation, unspecified: Secondary | ICD-10-CM

## 2022-06-14 DIAGNOSIS — G5600 Carpal tunnel syndrome, unspecified upper limb: Secondary | ICD-10-CM

## 2022-06-14 DIAGNOSIS — E785 Hyperlipidemia, unspecified: Secondary | ICD-10-CM

## 2022-06-14 LAB — BASIC METABOLIC PANEL
CHLORIDE: 106 MMOL/L (ref 98–110)
CO2: 25 MMOL/L (ref 21–30)
CREATININE: 1.5 mg/dL — ABNORMAL HIGH (ref 0.4–1.00)
GLUCOSE,PANEL: 165 mg/dL — ABNORMAL HIGH (ref 70–100)
POTASSIUM: 4.4 MMOL/L (ref 3.5–5.1)
SODIUM: 139 MMOL/L (ref 137–147)

## 2022-06-14 LAB — 25-OH VITAMIN D (D2 + D3): VITAMIN D (25-OH) TOTAL: 28 ng/mL — ABNORMAL LOW (ref 30–80)

## 2022-06-14 LAB — PHOSPHORUS: PHOSPHORUS: 2.9 mg/dL (ref 2.0–4.5)

## 2022-06-14 LAB — CBC
MPV: 8.5 FL (ref 7–11)
PLATELET COUNT: 248 K/UL (ref 150–400)
RBC COUNT: 4.3 M/UL — ABNORMAL LOW (ref >60)
RDW: 15 % — ABNORMAL HIGH (ref 11–15)
WBC COUNT: 6.8 K/UL (ref 4.5–11.0)

## 2022-06-14 LAB — URINALYSIS, MICROSCOPIC

## 2022-06-14 LAB — PARATHYROID HORMONE: PTH HORMONE: 126 pg/mL — ABNORMAL HIGH (ref 10–65)

## 2022-06-14 LAB — IRON + BINDING CAPACITY + %SAT+ FERRITIN
FERRITIN: 132 ng/mL (ref 10–200)
IRON BINDING: 393 ug/dL — ABNORMAL HIGH (ref 270–380)
IRON: 93 ug/dL — ABNORMAL LOW (ref 50–160)

## 2022-06-14 LAB — PROTEIN/CR RATIO,UR RAN
UR CREATININE, RAN: 29 mg/dL
UR TOTAL PROTEIN,RAN: 9 mg/dL

## 2022-06-14 LAB — HEPATITIS B SURFACE AG

## 2022-06-14 MED ORDER — IRBESARTAN 150 MG PO TAB
150 mg | ORAL_TABLET | Freq: Every evening | ORAL | 3 refills | Status: AC
Start: 2022-06-14 — End: ?

## 2022-06-14 MED ORDER — CLONIDINE HCL 0.1 MG PO TAB
ORAL_TABLET | 0 refills | Status: AC
Start: 2022-06-14 — End: ?
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
## 2022-06-16 ENCOUNTER — Telehealth: Payer: Self-pay | Admitting: Family Medicine

## 2022-06-16 NOTE — Telephone Encounter (Signed)
Nurse Assessment Nurse: Raenette Rover, RN, Zella Ball Date/Time Eilene Ghazi Time): 06/16/2022 1:19:35 PM Confirm and document reason for call. If symptomatic, describe symptoms. ---Worsening umbilical hernia symptoms, Detected 7-8 months ago. Painful and unable to sit. Does the patient have any new or worsening symptoms? ---Yes Will a triage be completed? ---Yes Related visit to physician within the last 2 weeks? ---No Does the PT have any chronic conditions? (i.e. diabetes, asthma, this includes High risk factors for pregnancy, etc.) ---Yes List chronic conditions. ---blood disorder, multiple surgeries. Is this a behavioral health or substance abuse call? ---No Guidelines Guideline Title Affirmed Question Affirmed Notes Nurse Date/Time (Eastern Time) Hernia SEVERE abdominal pain Raenette Rover, RN, Zella Ball 06/16/2022 1:21:50 PM Disp. Time Eilene Ghazi Time) Disposition Final User 06/16/2022 1:18:46 PM Send to Urgent Queue Margaretmary Bayley 06/16/2022 1:22:30 PM Go to ED Now Yes Raenette Rover, RN, Zella Ball Final Disposition 06/16/2022 1:22:30 PM Go to ED Now Yes Raenette Rover, RN, Zella Ball PLEASE NOTE: All timestamps contained within this report are represented as [place] Standard Time. CONFIDENTIALTY NOTICE: This fax transmission is intended only for the addressee. It contains information that is legally privileged, confidential or otherwise protected from use or disclosure. If you are not the intended recipient, you are strictly prohibited from reviewing, disclosing, copying using or disseminating any of this information or taking any action in reliance on or regarding this information. If you have received this fax in error, please notify us immediately by telephone so that we can arrange for its return to [name]. Phone: [phone], Toll-Free: [phone], Fax: [phone] Page: 2 of 2 Call Id: [id number] [place] Disagree/Comply Comply Caller Understands Yes PreDisposition InappropriateToAsk Care Advice Given Per Guideline GO  TO ED NOW: * Leave now. Drive carefully. NOTHING BY MOUTH: * Do not eat or drink anything for now. ANOTHER ADULT SHOULD DRIVE: BRING MEDICINES: * Bring a list of your current medicines when you go to the Emergency Department (ER). CARE ADVICE given per Hernia (Adult) guideline Referrals [facility] - ED

## 2022-06-16 NOTE — Telephone Encounter (Signed)
FYI. Pt advised to go to the ED

## 2022-06-16 NOTE — Telephone Encounter (Signed)
Pt ready for scheduling on or after 06/16/22  Out-of-pocket cost due at time of visit: $0  Primary: [facility] [place] co-insurance: 0% Admin fee co-insurance: 0%  Secondary: n/a [place] co-insurance:  Admin fee co-insurance:   Deductible: does not apply  Prior Auth: NOT required PA# Valid:    ** This summary of benefits is an estimation of the patient's out-of-pocket cost. Exact cost may vary based on individual plan coverage.

## 2022-06-16 NOTE — Telephone Encounter (Signed)
Pt stated she was diagnosed with a hernia over a year ago and is in a lot of pain, with stomach swelling. She can't eat or lay down without pain and wanted an appt. Transferred to triage to seek advice from a nurse.

## 2022-06-17 ENCOUNTER — Encounter: Admit: 2022-06-17 | Discharge: 2022-06-17 | Payer: MEDICARE

## 2022-06-19 ENCOUNTER — Other Ambulatory Visit: Payer: Self-pay | Admitting: Family Medicine

## 2022-06-19 DIAGNOSIS — R110 Nausea: Secondary | ICD-10-CM

## 2022-06-20 ENCOUNTER — Inpatient Hospital Stay (HOSPITAL_COMMUNITY)
Admission: EM | Admit: 2022-06-20 | Discharge: 2022-07-11 | DRG: 457 | Disposition: A | Discharge status: Home Health | Payer: PRIVATE HEALTH INSURANCE | Attending: Internal Medicine | Admitting: Internal Medicine

## 2022-06-20 ENCOUNTER — Encounter (HOSPITAL_COMMUNITY): Payer: Self-pay

## 2022-06-20 ENCOUNTER — Emergency Department (HOSPITAL_COMMUNITY): Payer: PRIVATE HEALTH INSURANCE

## 2022-06-20 DIAGNOSIS — K219 Gastro-esophageal reflux disease without esophagitis: Secondary | ICD-10-CM | POA: Diagnosis present

## 2022-06-20 DIAGNOSIS — Z7401 Bed confinement status: Secondary | ICD-10-CM

## 2022-06-20 DIAGNOSIS — G8929 Other chronic pain: Secondary | ICD-10-CM | POA: Diagnosis present

## 2022-06-20 DIAGNOSIS — I82503 Chronic embolism and thrombosis of unspecified deep veins of lower extremity, bilateral: Secondary | ICD-10-CM | POA: Diagnosis present

## 2022-06-20 DIAGNOSIS — D509 Iron deficiency anemia, unspecified: Secondary | ICD-10-CM | POA: Diagnosis present

## 2022-06-20 DIAGNOSIS — I132 Hypertensive heart and chronic kidney disease with heart failure and with stage 5 chronic kidney disease, or end stage renal disease: Secondary | ICD-10-CM | POA: Diagnosis present

## 2022-06-20 DIAGNOSIS — Z981 Arthrodesis status: Secondary | ICD-10-CM

## 2022-06-20 DIAGNOSIS — E1142 Type 2 diabetes mellitus with diabetic polyneuropathy: Secondary | ICD-10-CM | POA: Diagnosis present

## 2022-06-20 DIAGNOSIS — Z91048 Other nonmedicinal substance allergy status: Secondary | ICD-10-CM

## 2022-06-20 DIAGNOSIS — M19042 Primary osteoarthritis, left hand: Secondary | ICD-10-CM | POA: Diagnosis present

## 2022-06-20 DIAGNOSIS — R7881 Bacteremia: Secondary | ICD-10-CM | POA: Diagnosis not present

## 2022-06-20 DIAGNOSIS — N185 Chronic kidney disease, stage 5: Secondary | ICD-10-CM | POA: Diagnosis present

## 2022-06-20 DIAGNOSIS — E872 Acidosis, unspecified: Secondary | ICD-10-CM | POA: Diagnosis present

## 2022-06-20 DIAGNOSIS — H548 Legal blindness, as defined in USA: Secondary | ICD-10-CM | POA: Diagnosis present

## 2022-06-20 DIAGNOSIS — I2510 Atherosclerotic heart disease of native coronary artery without angina pectoris: Secondary | ICD-10-CM | POA: Diagnosis present

## 2022-06-20 DIAGNOSIS — M8618 Other acute osteomyelitis, other site: Secondary | ICD-10-CM | POA: Diagnosis not present

## 2022-06-20 DIAGNOSIS — M4644 Discitis, unspecified, thoracic region: Secondary | ICD-10-CM | POA: Diagnosis present

## 2022-06-20 DIAGNOSIS — E1122 Type 2 diabetes mellitus with diabetic chronic kidney disease: Secondary | ICD-10-CM | POA: Diagnosis present

## 2022-06-20 DIAGNOSIS — E119 Type 2 diabetes mellitus without complications: Secondary | ICD-10-CM | POA: Diagnosis present

## 2022-06-20 DIAGNOSIS — N3000 Acute cystitis without hematuria: Secondary | ICD-10-CM | POA: Diagnosis present

## 2022-06-20 DIAGNOSIS — R112 Nausea with vomiting, unspecified: Secondary | ICD-10-CM

## 2022-06-20 DIAGNOSIS — Z7984 Long term (current) use of oral hypoglycemic drugs: Secondary | ICD-10-CM

## 2022-06-20 DIAGNOSIS — Z880 Allergy status to penicillin: Secondary | ICD-10-CM

## 2022-06-20 DIAGNOSIS — E11319 Type 2 diabetes mellitus with unspecified diabetic retinopathy without macular edema: Secondary | ICD-10-CM | POA: Diagnosis present

## 2022-06-20 DIAGNOSIS — N390 Urinary tract infection, site not specified: Secondary | ICD-10-CM | POA: Diagnosis not present

## 2022-06-20 DIAGNOSIS — Z7901 Long term (current) use of anticoagulants: Secondary | ICD-10-CM

## 2022-06-20 DIAGNOSIS — D649 Anemia, unspecified: Secondary | ICD-10-CM | POA: Diagnosis present

## 2022-06-20 DIAGNOSIS — Z9049 Acquired absence of other specified parts of digestive tract: Secondary | ICD-10-CM

## 2022-06-20 DIAGNOSIS — N189 Chronic kidney disease, unspecified: Secondary | ICD-10-CM | POA: Diagnosis not present

## 2022-06-20 DIAGNOSIS — J45909 Unspecified asthma, uncomplicated: Secondary | ICD-10-CM | POA: Diagnosis present

## 2022-06-20 DIAGNOSIS — Y830 Surgical operation with transplant of whole organ as the cause of abnormal reaction of the patient, or of later complication, without mention of misadventure at the time of the procedure: Secondary | ICD-10-CM | POA: Diagnosis present

## 2022-06-20 DIAGNOSIS — F32A Depression, unspecified: Secondary | ICD-10-CM | POA: Diagnosis present

## 2022-06-20 DIAGNOSIS — I5032 Chronic diastolic (congestive) heart failure: Secondary | ICD-10-CM | POA: Diagnosis present

## 2022-06-20 DIAGNOSIS — L97519 Non-pressure chronic ulcer of other part of right foot with unspecified severity: Secondary | ICD-10-CM | POA: Diagnosis present

## 2022-06-20 DIAGNOSIS — M4626 Osteomyelitis of vertebra, lumbar region: Secondary | ICD-10-CM | POA: Diagnosis not present

## 2022-06-20 DIAGNOSIS — Z6839 Body mass index (BMI) 39.0-39.9, adult: Secondary | ICD-10-CM

## 2022-06-20 DIAGNOSIS — M869 Osteomyelitis, unspecified: Secondary | ICD-10-CM

## 2022-06-20 DIAGNOSIS — R197 Diarrhea, unspecified: Secondary | ICD-10-CM | POA: Diagnosis not present

## 2022-06-20 DIAGNOSIS — E7800 Pure hypercholesterolemia, unspecified: Secondary | ICD-10-CM | POA: Diagnosis present

## 2022-06-20 DIAGNOSIS — R451 Restlessness and agitation: Secondary | ICD-10-CM | POA: Diagnosis not present

## 2022-06-20 DIAGNOSIS — Z90710 Acquired absence of both cervix and uterus: Secondary | ICD-10-CM

## 2022-06-20 DIAGNOSIS — M4625 Osteomyelitis of vertebra, thoracolumbar region: Secondary | ICD-10-CM | POA: Diagnosis not present

## 2022-06-20 DIAGNOSIS — B961 Klebsiella pneumoniae [K. pneumoniae] as the cause of diseases classified elsewhere: Secondary | ICD-10-CM | POA: Diagnosis not present

## 2022-06-20 DIAGNOSIS — M160 Bilateral primary osteoarthritis of hip: Secondary | ICD-10-CM | POA: Diagnosis present

## 2022-06-20 DIAGNOSIS — E11621 Type 2 diabetes mellitus with foot ulcer: Secondary | ICD-10-CM | POA: Diagnosis present

## 2022-06-20 DIAGNOSIS — F4310 Post-traumatic stress disorder, unspecified: Secondary | ICD-10-CM | POA: Diagnosis present

## 2022-06-20 DIAGNOSIS — T8619 Other complication of kidney transplant: Secondary | ICD-10-CM | POA: Diagnosis present

## 2022-06-20 DIAGNOSIS — M4624 Osteomyelitis of vertebra, thoracic region: Principal | ICD-10-CM | POA: Diagnosis present

## 2022-06-20 DIAGNOSIS — M8580 Other specified disorders of bone density and structure, unspecified site: Secondary | ICD-10-CM | POA: Diagnosis present

## 2022-06-20 DIAGNOSIS — E669 Obesity, unspecified: Secondary | ICD-10-CM | POA: Diagnosis present

## 2022-06-20 DIAGNOSIS — M40204 Unspecified kyphosis, thoracic region: Secondary | ICD-10-CM | POA: Diagnosis not present

## 2022-06-20 DIAGNOSIS — Z86711 Personal history of pulmonary embolism: Secondary | ICD-10-CM

## 2022-06-20 DIAGNOSIS — Z886 Allergy status to analgesic agent status: Secondary | ICD-10-CM

## 2022-06-20 DIAGNOSIS — Z79899 Other long term (current) drug therapy: Secondary | ICD-10-CM

## 2022-06-20 DIAGNOSIS — Z818 Family history of other mental and behavioral disorders: Secondary | ICD-10-CM

## 2022-06-20 DIAGNOSIS — Z888 Allergy status to other drugs, medicaments and biological substances status: Secondary | ICD-10-CM

## 2022-06-20 DIAGNOSIS — I10 Essential (primary) hypertension: Secondary | ICD-10-CM | POA: Diagnosis not present

## 2022-06-20 DIAGNOSIS — I252 Old myocardial infarction: Secondary | ICD-10-CM

## 2022-06-20 DIAGNOSIS — B957 Other staphylococcus as the cause of diseases classified elsewhere: Secondary | ICD-10-CM | POA: Diagnosis not present

## 2022-06-20 DIAGNOSIS — N184 Chronic kidney disease, stage 4 (severe): Secondary | ICD-10-CM | POA: Diagnosis not present

## 2022-06-20 DIAGNOSIS — Z794 Long term (current) use of insulin: Secondary | ICD-10-CM

## 2022-06-20 DIAGNOSIS — Z993 Dependence on wheelchair: Secondary | ICD-10-CM

## 2022-06-20 DIAGNOSIS — E860 Dehydration: Secondary | ICD-10-CM | POA: Diagnosis present

## 2022-06-20 DIAGNOSIS — K7460 Unspecified cirrhosis of liver: Secondary | ICD-10-CM | POA: Diagnosis present

## 2022-06-20 DIAGNOSIS — E1165 Type 2 diabetes mellitus with hyperglycemia: Secondary | ICD-10-CM

## 2022-06-20 DIAGNOSIS — B955 Unspecified streptococcus as the cause of diseases classified elsewhere: Secondary | ICD-10-CM

## 2022-06-20 DIAGNOSIS — Z833 Family history of diabetes mellitus: Secondary | ICD-10-CM

## 2022-06-20 DIAGNOSIS — N179 Acute kidney failure, unspecified: Secondary | ICD-10-CM | POA: Diagnosis present

## 2022-06-20 DIAGNOSIS — Z8619 Personal history of other infectious and parasitic diseases: Secondary | ICD-10-CM

## 2022-06-20 DIAGNOSIS — L97529 Non-pressure chronic ulcer of other part of left foot with unspecified severity: Secondary | ICD-10-CM | POA: Diagnosis present

## 2022-06-20 DIAGNOSIS — E875 Hyperkalemia: Secondary | ICD-10-CM | POA: Diagnosis not present

## 2022-06-20 DIAGNOSIS — Z90721 Acquired absence of ovaries, unilateral: Secondary | ICD-10-CM

## 2022-06-20 DIAGNOSIS — Z841 Family history of disorders of kidney and ureter: Secondary | ICD-10-CM

## 2022-06-20 DIAGNOSIS — M4804 Spinal stenosis, thoracic region: Secondary | ICD-10-CM | POA: Diagnosis present

## 2022-06-20 DIAGNOSIS — I129 Hypertensive chronic kidney disease with stage 1 through stage 4 chronic kidney disease, or unspecified chronic kidney disease: Secondary | ICD-10-CM | POA: Diagnosis not present

## 2022-06-20 DIAGNOSIS — Z91018 Allergy to other foods: Secondary | ICD-10-CM

## 2022-06-20 DIAGNOSIS — Z91030 Bee allergy status: Secondary | ICD-10-CM

## 2022-06-20 DIAGNOSIS — D631 Anemia in chronic kidney disease: Secondary | ICD-10-CM | POA: Diagnosis present

## 2022-06-20 DIAGNOSIS — K760 Fatty (change of) liver, not elsewhere classified: Secondary | ICD-10-CM | POA: Diagnosis present

## 2022-06-20 DIAGNOSIS — G4733 Obstructive sleep apnea (adult) (pediatric): Secondary | ICD-10-CM | POA: Diagnosis present

## 2022-06-20 DIAGNOSIS — Z8249 Family history of ischemic heart disease and other diseases of the circulatory system: Secondary | ICD-10-CM

## 2022-06-20 DIAGNOSIS — M19041 Primary osteoarthritis, right hand: Secondary | ICD-10-CM | POA: Diagnosis present

## 2022-06-20 DIAGNOSIS — E1129 Type 2 diabetes mellitus with other diabetic kidney complication: Secondary | ICD-10-CM | POA: Diagnosis present

## 2022-06-20 DIAGNOSIS — Z91013 Allergy to seafood: Secondary | ICD-10-CM

## 2022-06-20 DIAGNOSIS — M797 Fibromyalgia: Secondary | ICD-10-CM | POA: Diagnosis present

## 2022-06-20 LAB — COMPREHENSIVE METABOLIC PANEL
ALT: 10 U/L (ref 0–44)
AST: 15 U/L (ref 15–41)
Albumin: 3.4 g/dL — ABNORMAL LOW (ref 3.5–5.0)
Alkaline Phosphatase: 110 U/L (ref 38–126)
Anion gap: 10 (ref 5–15)
BUN: 55 mg/dL — ABNORMAL HIGH (ref 8–23)
CO2: 17 mmol/L — ABNORMAL LOW (ref 22–32)
Calcium: 9.7 mg/dL (ref 8.9–10.3)
Chloride: 113 mmol/L — ABNORMAL HIGH (ref 98–111)
Creatinine, Ser: 3.88 mg/dL — ABNORMAL HIGH (ref 0.44–1.00)
GFR, Estimated: 12 mL/min — ABNORMAL LOW (ref >60)
Glucose, Bld: 156 mg/dL — ABNORMAL HIGH (ref 70–99)
Potassium: 4.6 mmol/L (ref 3.5–5.1)
Sodium: 140 mmol/L (ref 135–145)
Total Bilirubin: 0.8 mg/dL (ref 0.3–1.2)
Total Protein: 8.9 g/dL — ABNORMAL HIGH (ref 6.5–8.1)

## 2022-06-20 LAB — URINALYSIS, ROUTINE W REFLEX MICROSCOPIC
Bilirubin Urine: NEGATIVE
Glucose, UA: 50 mg/dL — AB
Ketones, ur: NEGATIVE mg/dL
Nitrite: NEGATIVE
Protein, ur: 100 mg/dL — AB
Specific Gravity, Urine: 1.013 (ref 1.005–1.030)
WBC, UA: >50 WBC/hpf — ABNORMAL HIGH (ref 0–5)
pH: 5 (ref 5.0–8.0)

## 2022-06-20 LAB — SEDIMENTATION RATE: Sed Rate: 126 mm/hr — ABNORMAL HIGH (ref 0–22)

## 2022-06-20 LAB — CBC WITH DIFFERENTIAL/PLATELET
Abs Immature Granulocytes: 0.02 10*3/uL (ref 0.00–0.07)
Basophils Absolute: 0.1 10*3/uL (ref 0.0–0.1)
Basophils Relative: 1 %
Eosinophils Absolute: 0.1 10*3/uL (ref 0.0–0.5)
Eosinophils Relative: 1 %
HCT: 35.7 % — ABNORMAL LOW (ref 36.0–46.0)
Hemoglobin: 11.1 g/dL — ABNORMAL LOW (ref 12.0–15.0)
Immature Granulocytes: 0 %
Lymphocytes Relative: 8 %
Lymphs Abs: 0.8 10*3/uL (ref 0.7–4.0)
MCH: 26.9 pg (ref 26.0–34.0)
MCHC: 31.1 g/dL (ref 30.0–36.0)
MCV: 86.7 fL (ref 80.0–100.0)
Monocytes Absolute: 0.6 10*3/uL (ref 0.1–1.0)
Monocytes Relative: 5 %
Neutro Abs: 9.4 10*3/uL — ABNORMAL HIGH (ref 1.7–7.7)
Neutrophils Relative %: 85 %
Platelets: 249 10*3/uL (ref 150–400)
RBC: 4.12 MIL/uL (ref 3.87–5.11)
RDW: 15.4 % (ref 11.5–15.5)
WBC: 10.9 10*3/uL — ABNORMAL HIGH (ref 4.0–10.5)
nRBC: 0 % (ref 0.0–0.2)

## 2022-06-20 LAB — TYPE AND SCREEN
ABO/RH(D): A NEG
Antibody Screen: NEGATIVE

## 2022-06-20 LAB — I-STAT CHEM 8, ED
BUN: 49 mg/dL — ABNORMAL HIGH (ref 8–23)
Calcium, Ion: 1.24 mmol/L (ref 1.15–1.40)
Chloride: 111 mmol/L (ref 98–111)
Creatinine, Ser: 4.2 mg/dL — ABNORMAL HIGH (ref 0.44–1.00)
Glucose, Bld: 151 mg/dL — ABNORMAL HIGH (ref 70–99)
HCT: 35 % — ABNORMAL LOW (ref 36.0–46.0)
Hemoglobin: 11.9 g/dL — ABNORMAL LOW (ref 12.0–15.0)
Potassium: 4.6 mmol/L (ref 3.5–5.1)
Sodium: 141 mmol/L (ref 135–145)
TCO2: 18 mmol/L — ABNORMAL LOW (ref 22–32)

## 2022-06-20 LAB — LIPASE, BLOOD: Lipase: 46 U/L (ref 11–51)

## 2022-06-20 MED ORDER — MORPHINE SULFATE (PF) 4 MG/ML IV SOLN
4.0000 mg | Freq: Once | INTRAVENOUS | Status: AC
  Administered 2022-06-20: 4 mg via INTRAVENOUS
  Filled 2022-06-20: qty 1

## 2022-06-20 MED ORDER — HYDROMORPHONE HCL 2 MG/ML IJ SOLN
1.0000 mg | Freq: Once | INTRAMUSCULAR | Status: AC
  Administered 2022-06-20: 1 mg via INTRAVENOUS
  Filled 2022-06-20: qty 1

## 2022-06-20 MED ORDER — SODIUM CHLORIDE 0.9 % IV SOLN
2.0000 g | Freq: Once | INTRAVENOUS | Status: AC
  Administered 2022-06-20: 2 g via INTRAVENOUS
  Filled 2022-06-20: qty 20

## 2022-06-20 MED ORDER — LACTATED RINGERS IV BOLUS
1000.0000 mL | Freq: Once | INTRAVENOUS | Status: AC
  Administered 2022-06-20: 1000 mL via INTRAVENOUS

## 2022-06-20 MED ORDER — TRIMETHOBENZAMIDE HCL 100 MG/ML IM SOLN
200.0000 mg | INTRAMUSCULAR | Status: AC
  Administered 2022-06-20: 200 mg via INTRAMUSCULAR
  Filled 2022-06-20: qty 2

## 2022-06-20 MED ORDER — METOCLOPRAMIDE HCL 5 MG/ML IJ SOLN
10.0000 mg | Freq: Once | INTRAMUSCULAR | Status: AC
  Administered 2022-06-20: 10 mg via INTRAVENOUS
  Filled 2022-06-20: qty 2

## 2022-06-20 NOTE — ED Notes (Signed)
Purewik applied

## 2022-06-20 NOTE — ED Provider Notes (Addendum)
[place] DEPT Provider Note   CSN: [id number] Arrival date & time: 06/20/22  1620     History  Chief Complaint  Patient presents with   Abdominal Pain   Emesis   Nausea    [name] [name] is a 66 y.o. adult with a past medical history of CKD stage 3b, osteomyelitis, hypertension, hyperlipidemia, and epigastric hernia presenting to the emergency department with a 4-day history of nausea, vomiting, and abdominal pain.  She also reports having chronic back pain.  She states that she takes hydrocodone at home for this chronic back pain.  Recently, she states that she has not been able to take her pain medicine because of her nausea and vomiting.  She reports having a 101 F fever last night, but has not had any other.  She states that she has not been eating or drinking well due to this nausea and vomiting.  Patient does report her hernia is usually reducible, but in the past couple days it has not been reducible.  She reports that this nausea and vomiting started 4 days ago, and has not gotten better.  She denies anything making it better or worse.  She tried Pepto-Bismol with no help.  She states that her last bowel movement was loose about 2 days ago.  She has not had 1 since.  Denies any blood in her vomit.  She did try eating cottage cheese today, and was able to keep that down.  She does report that she is passing gas.  Patient reports that she is currently nauseous, and did receive 4 mg of Zofran as well as 600 cc LR in route via EMS.  Of note, patient is due to have surgery on her back in the next month or so with Dr. Duffy Rhody.   Upon further questioning, patient states that she was not aware that she may have osteomyelitis.  She states that her neurosurgeon did tell her that she may have an infection, but did not stay if she needed to be on antibiotics or not. MRI showing discitis/osteomyelitis T10-T11 on 07/15.    Abdominal Pain Associated  symptoms: fever, nausea and vomiting   Associated symptoms: no chest pain, no chills, no constipation, no diarrhea and no shortness of breath   Emesis Associated symptoms: abdominal pain and fever   Associated symptoms: no chills and no diarrhea        Home Medications Prior to Admission medications   Medication Sig Start Date End Date Taking? Authorizing Provider  acetaminophen (TYLENOL) 325 MG tablet Take 2 tablets (650 mg total) by mouth every 6 (six) hours as needed for mild pain, fever or headache (or Fever >/= 101). Discontinue 500/1000 mg dosing 01/09/22   Little Ishikawa, MD  albuterol (PROVENTIL) (2.5 MG/3ML) 0.083% nebulizer solution Take 3 mLs (2.5 mg total) by nebulization every 6 (six) hours as needed for wheezing or shortness of breath. 02/14/22   Ann Held, DO  albuterol (VENTOLIN HFA) 108 (90 Base) MCG/ACT inhaler Inhale 2 puffs into the lungs every 6 (six) hours as needed for wheezing or shortness of breath. 02/14/22   Ann Held, DO  apixaban (ELIQUIS) 2.5 MG TABS tablet Take 1 tablet (2.5 mg total) by mouth 2 (two) times daily. 02/14/22   Ann Held, DO  atorvastatin (LIPITOR) 10 MG tablet Take 1 tablet (10 mg total) by mouth daily. 01/09/22   Little Ishikawa, MD  carvedilol (COREG) 6.25 MG tablet  Take 1 tablet (6.25 mg total) by mouth 2 (two) times daily with a meal. 01/09/22   Little Ishikawa, MD  Continuous Blood Gluc Receiver (FREESTYLE LIBRE 14 DAY READER) DEVI 1 Device by Does not apply route daily. 03/01/20   Copland, Gay Filler, MD  Continuous Blood Gluc Sensor (FREESTYLE LIBRE 14 DAY SENSOR) MISC 1 Device by Does not apply route daily. 03/01/20   Copland, Gay Filler, MD  diazepam (VALIUM) 5 MG tablet Take 1 tablet (5 mg total) by mouth every 12 (twelve) hours as needed for anxiety. 05/25/22   Ann Held, DO  DULoxetine (CYMBALTA) 60 MG capsule Take 1 capsule (60 mg total) by mouth daily. 05/26/22   Ann Held,  DO  ferrous sulfate 325 (65 FE) MG tablet Take 1 tablet (325 mg total) by mouth 2 (two) times daily with a meal. 10/22/21   Thurnell Lose, MD  fluticasone-salmeterol (ADVAIR HFA) 115-21 MCG/ACT inhaler Inhale 2 puffs into the lungs 2 (two) times daily. 02/14/22   Ann Held, DO  folic acid (FOLVITE) 1 MG tablet Take 2 tablets by mouth once daily Patient taking differently: Take 2 mg by mouth daily. 11/11/21   Volanda Napoleon, MD  furosemide (LASIX) 20 MG tablet Take 2 tablets (40 mg total) by mouth daily. 01/09/22   Little Ishikawa, MD  HYDROcodone-acetaminophen (NORCO/VICODIN) 5-325 MG tablet Take 1 tablet by mouth every 6 (six) hours as needed for moderate pain. 06/02/22   [name] R, DO  insulin glargine (LANTUS SOLOSTAR) 100 UNIT/ML Solostar Pen INJECT 20 UNITS SUBCUTANEOUSLY IN THE MORNING AND 40 AT NIGHT Patient taking differently: 15-18 Units at bedtime. 05/18/21   Ann Held, DO  lubiprostone (AMITIZA) 24 MCG capsule Take 1 capsule (24 mcg total) by mouth 2 (two) times daily with a meal. 05/04/22   Carollee Herter, Alferd Apa, DO  montelukast (SINGULAIR) 10 MG tablet Take 1 tablet (10 mg total) by mouth at bedtime. 02/14/22   Ann Held, DO  Multiple Vitamin (MULTIVITAMIN WITH MINERALS) TABS tablet Take 1 tablet by mouth daily. 02/18/20   Angiulli, Lavon Paganini, PA-C  NONFORMULARY OR COMPOUNDED ITEM Power wheelchair  #1  as directed 12/19/21   Carollee Herter, Alferd Apa, DO  ondansetron (ZOFRAN) 8 MG tablet Take 0.5 tablets (4 mg total) by mouth every 8 (eight) hours as needed for nausea or vomiting. 06/19/22   Carollee Herter, Alferd Apa, DO  pregabalin (LYRICA) 75 MG capsule Take 1 capsule (75 mg total) by mouth 3 (three) times daily. 03/23/22   Ann Held, DO  prochlorperazine (COMPAZINE) 10 MG tablet Take 1 tablet (10 mg total) by mouth 2 (two) times daily as needed for nausea or vomiting. 03/25/22   Regan Lemming, MD  VITAMIN D PO Take 125 mcg by mouth  daily.    [provider]  metFORMIN (GLUCOPHAGE) 1000 MG tablet Take 1,000 mg by mouth 2 (two) times daily with a meal.    12/19/21  [provider]  omeprazole (PRILOSEC) 20 MG capsule Take 20 mg by mouth daily.    12/19/21  [provider]      Allergies    Bee pollen, Fish-derived products, Mushroom extract complex, Penicillins, Rosemary oil, Shellfish allergy, Tomato, Acetaminophen, Aloe vera, Broccoli [brassica oleracea], Acyclovir and related, and Naproxen    Review of Systems   Review of Systems  Constitutional:  Positive for fever. Negative for chills.  Respiratory:  Negative  for shortness of breath.   Cardiovascular:  Negative for chest pain.  Gastrointestinal:  Positive for abdominal pain, nausea and vomiting. Negative for blood in stool, constipation and diarrhea.  Musculoskeletal:  Positive for back pain.    Physical Exam Updated Vital Signs BP (!) 163/72   Pulse 93   Temp 98.3 F (36.8 C) (Oral)   Resp 18   SpO2 98%  Physical Exam  General: Patient is in pain upon exam Eyes: Pupils equal and reactive to light Head: Normocephalic, atraumatic  Neck: Supple, nontender, full range of motion Cardio: Regular rate and rhythm, no murmurs, rubs or gallops. 2+ pulses to bilateral upper and lower extremities  Chest: No chest tenderness Pulmonary: Clear to ausculation bilaterally with no rales, rhonchi, and crackles  Abdomen: There is diffuse tenderness throughout with guarding.  Epigastric hernia appreciated. Back: Tender to bilateral paraspinal muscle tenderness throughout.  No midline spinal tenderness.  No step-off or deformity appreciated. Skin: No rashes noted to abdomen  ED Results / Procedures / Treatments   Labs (all labs ordered are listed, but only abnormal results are displayed) Labs Reviewed  CBC WITH DIFFERENTIAL/PLATELET - Abnormal; Notable for the following components:      Result Value   WBC 10.9 (*)    Hemoglobin 11.1 (*)     HCT 35.7 (*)    Neutro Abs 9.4 (*)    All other components within normal limits  COMPREHENSIVE METABOLIC PANEL - Abnormal; Notable for the following components:   Chloride 113 (*)    CO2 17 (*)    Glucose, Bld 156 (*)    BUN 55 (*)    Creatinine, Ser 3.88 (*)    Total Protein 8.9 (*)    Albumin 3.4 (*)    GFR, Estimated 12 (*)    All other components within normal limits  URINALYSIS, ROUTINE W REFLEX MICROSCOPIC - Abnormal; Notable for the following components:   APPearance CLOUDY (*)    Glucose, UA 50 (*)    Hgb urine dipstick MODERATE (*)    Protein, ur 100 (*)    Leukocytes,Ua LARGE (*)    WBC, UA >50 (*)    Bacteria, UA MANY (*)    All other components within normal limits  I-STAT CHEM 8, ED - Abnormal; Notable for the following components:   BUN 49 (*)    Creatinine, Ser 4.20 (*)    Glucose, Bld 151 (*)    TCO2 18 (*)    Hemoglobin 11.9 (*)    HCT 35.0 (*)    All other components within normal limits  URINE CULTURE  LIPASE, BLOOD  SEDIMENTATION RATE  C-REACTIVE PROTEIN  TYPE AND SCREEN    EKG None  Radiology CT ABDOMEN PELVIS WO CONTRAST  Result Date: 06/20/2022 CLINICAL DATA:  Acute abdominal pain.  Nausea and vomiting. EXAM: CT ABDOMEN AND PELVIS WITHOUT CONTRAST TECHNIQUE: Multidetector CT imaging of the abdomen and pelvis was performed following the standard protocol without IV contrast. RADIATION DOSE REDUCTION: This exam was performed according to the departmental dose-optimization program which includes automated exposure control, adjustment of the mA and/or kV according to patient size and/or use of iterative reconstruction technique. COMPARISON:  Most recent CT 03/25/2022.  MRI 05/13/2022 FINDINGS: Lower chest: No acute airspace disease, no pleural effusion. Hepatobiliary: Calcified granuloma in the liver. Nodular hepatic contours. No evidence of focal liver lesion on this unenhanced exam. Clips in the gallbladder fossa postcholecystectomy. No biliary  dilatation. Pancreas: No ductal dilatation or inflammation. Spleen: Upper normal  in size spanning 12.2 x 5.5 x 12.7 cm (volume = 450 cm^3). Calcified granuloma. Adrenals/Urinary Tract: No adrenal nodule. Chronic perinephric edema. No hydronephrosis. Calcification in the upper right kidney may be nonobstructing stone or vascular. No evidence of focal renal abnormality on this unenhanced exam. Unremarkable urinary bladder. Stomach/Bowel: Decompressed stomach. There is a small duodenal diverticulum. No bowel obstruction or inflammation. Normal appendix. Small to moderate colonic stool burden. There is sigmoid colonic redundancy. Vascular/Lymphatic: Moderate aortic atherosclerosis. No aortic aneurysm. Smooth aortic contours. Small retroperitoneal lymph nodes, not enlarged by size criteria. Reproductive: Hysterectomy.  No adnexal mass. Other: No ascites or free air. Small fat containing umbilical hernia. Mild rectus diastasis. Musculoskeletal: T10-T11 disc space irregularity, lucency, and paravertebral soft tissue thickening. There is paravertebral soft tissue edema. The superior pedicle screws extend into the region of disc abnormality. This is at site of prior MRI findings consistent with discitis osteomyelitis. Prior L1 corpectomy. IMPRESSION: 1. No acute abnormality in the abdomen/pelvis. 2. T10-T11 disc space irregularity, lucency, and paravertebral soft tissue thickening. 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 This may represent persistent or residual infection, although the degree of soft tissue edema is suspicious for on going infection. The superior pedicle screws extend into the region of disc abnormality. 3. Cirrhotic hepatic morphology.  Borderline splenomegaly. 4. Nonobstructing right renal stone versus vascular calcification. Aortic Atherosclerosis (ICD10-I70.0). Electronically Signed   By: Keith Rake M.D.   On: 06/20/2022 17:49    Procedures Procedures    Medications  Ordered in ED Medications  cefTRIAXone (ROCEPHIN) 2 g in sodium chloride 0.9 % 100 mL IVPB (2 g Intravenous New Bag/Given 06/20/22 2206)  trimethobenzamide (TIGAN) injection 200 mg (200 mg Intramuscular Given 06/20/22 1803)  lactated ringers bolus 1,000 mL (1,000 mLs Intravenous New Bag/Given 06/20/22 1923)  morphine (PF) 4 MG/ML injection 4 mg (4 mg Intravenous Given 06/20/22 1924)  metoCLOPramide (REGLAN) injection 10 mg (10 mg Intravenous Given 06/20/22 2205)  HYDROmorphone (DILAUDID) injection 1 mg (1 mg Intravenous Given 06/20/22 2205)    ED Course/ Medical Decision Making/ A&P                           Medical Decision Making This is a 66 year old female with a past medical history of epigastric hernia, hypertension, osteomyelitis, hyperlipidemia, and diabetes presenting to the emergency department with concerns of 4-day history of nausea, vomiting, and abdominal pain.  Patient reports that she has not been eating or drinking well.  She reports having a fever of 101 F yesterday.  Of note, patient had a recent MRI of the lumbar spine on 05/13/2022 showing osteomyelitis T10-T11.  Charting is difficult to follow, but upon further questioning, patient was told that she may have had a back infection 3 weeks ago, but no other follow-up was indicated.  Patient does report history of osteomyelitis, requiring IV antibiotics.  She recently had her PICC line taken out a couple months ago which was being used for IV infusion of iron.  Patient reports having osteomyelitis 2 years ago.  In the department, patient has not had a vomiting episode, but is still dry heaving.  Patient is diffusely tender to her abdomen on exam.  Patient has significant bilateral paraspinal muscle tenderness to her back throughout.  Elevated blood pressure of 167/115 likely secondary to pain.  Will give patient pain medicine in the department.  Will like to rule out any strangulation of her hernia.  Would also like  to evaluate for  pancreatitis and diverticulitis.  This also could be pain from her osteomyelitis.  We will also try to piece out the osteomyelitis plan.  Amount and/or Complexity of Data Reviewed Labs: ordered.  Risk Prescription drug management. Decision regarding hospitalization.   1847: Morphine 4 mg ordered.  Patient also received 1 L bolus lactated Ringer's.  CT abdomen pelvis showing no indication of diverticulitis.  CT does show a T10-T1 displaced irregularity, lucency, paravertebral soft tissue thickening that corresponded to July MRI likely osteomyelitis that could be worsening now.  CBC showing hemoglobin of 11.1.  Elevated white count of 10.9.  Stat CHEM 8 showing normal electrolytes.  Elevated creatinine 4.20 which is stable from previous creatinine 4.35.  This is up from baseline of 3.6.  CMP, lipase, UA pending.  Will reevaluate patient.Will consult with Infectious disease.  2000: Reevaluated patient at this time and the patient is doing well. Still waiting on ID to call back to figure out what to do with the patient.  UA showing. Large leukocytes with many bacteria.  With urinary symptoms, this could be UTI.  Will speak with ID and, to come up with plan.  Lipase negative.  CMP showing creatinine of 3.88.  GFR 12.  This could be worsening kidney function.  2116: Spoke with ID, Dr. Jule Ser, who states that he would like to get ESR and CRP.  He states that he wants to figure out if this is chronic or acute, and recommends that I speak with neurosurgery.  I speak with neurosurgery, Dr. Kathyrn Sheriff, who states that he will try to find Dr. Tawnya Crook notes to see what Dr. Marcello Moores has been doing for the patient, and to see if this is something acute versus chronic.  Dr. Kathyrn Sheriff states that the patient was being seen by Dr. Duffy Rhody for revision surgery.  The surgery was supposed to be scheduled, but it was canceled.  They are unable to comment on if this is acute versus chronic.  Dr. Kathyrn Sheriff does  state that per Dr. Marcello Moores, he was more worried about hardware failure compared to osteomyelitis.  We will reconsult infectious disease for further plan.  UA showing. Large leukocytes with many bacteria.  With urinary symptoms, this could be UTI.  Will speak with ID and, to come up with plan.  Lipase negative.   2200: Spoke to ID again, concern for acute osteomyelitis.  Patient also has acute UTI.  Will treat with Rocephin 2 g.  ID will see the patient tomorrow to evaluate for osteomyelitis.  Patient will be admitted for further work-up.  2319: ESR elevated 126.  This is acute elevation when compared to 1 month ago, it was at 39.  2 years ago when patient did have active osteomyelitis, it was elevated at 104.         Final Clinical Impression(s) / ED Diagnoses Final diagnoses:  Acute cystitis without hematuria  Other acute osteomyelitis, other site [facility])    Rx / DC Orders ED Discharge Orders     None         Leigh Aurora, DO 06/20/22 2215    Blanchie Dessert, MD 06/20/22 2318    Leigh Aurora, DO 06/20/22 2319    Blanchie Dessert, MD 06/23/22 [phone]

## 2022-06-20 NOTE — Telephone Encounter (Signed)
Is this $0?

## 2022-06-20 NOTE — ED Triage Notes (Addendum)
Pt arrived via EMS, diffuse abd pain, n/v x4 days. Also c/o chronic back pain.    4 mg zofran given en route 600 cc LR 20 L AC

## 2022-06-20 NOTE — ED Provider Triage Note (Signed)
Emergency Medicine Provider Triage Evaluation Note  [name] [name] , a 66 y.o. adult  was evaluated in triage.  Pt complains of abdominal pain, nausea, and vomiting since Thursday. She denies any melena. Denies any fevers.  Review of Systems  Positive:  Negative:   Physical Exam  BP (!) 191/121 (BP Location: Right Arm) Comment: Pt is throwing up.  Pulse 79   Temp 98.5 F (36.9 C) (Oral)   Resp 16   SpO2 100%  Gen:   Awake, no distress   Resp:  Normal effort  MSK:   Moves extremities without difficulty  Other:  Diffuse abdominal tenderness. Without guarding or rebound.   Medical Decision Making  Medically screening exam initiated at 4:41 PM.  Appropriate orders placed.  [name] [name] was informed that the remainder of the evaluation will be completed by another provider, this initial triage assessment does not replace that evaluation, and the importance of remaining in the ED until their evaluation is complete.  Patient baseline Cr is the 3-4 range. Will do CT without contrast.    Sherrell Puller, PA-C 06/20/22 1649

## 2022-06-20 NOTE — Chronic Care Management (AMB) (Signed)
  Chronic Care Management Note  06/20/2022 Name: [name] [name] MRN: [id number] DOB: [birth year]  [name] [name] is a 66 y.o. year old adult who is a primary care patient of Ann Held, DO and is actively engaged with the care management team. I reached out to [name] by phone today to assist with re-scheduling an initial visit with the Pharmacist  Follow up plan: 2nd Unsuccessful telephone outreach attempt made. A HIPAA compliant phone message was left for the patient providing contact information and requesting a return call.   Julian Hy, [place] Direct Dial: [phone]

## 2022-06-20 NOTE — ED Notes (Signed)
Consult to Neurosurgery@21 :00pm for Internal Medicine.

## 2022-06-21 ENCOUNTER — Other Ambulatory Visit: Payer: Self-pay

## 2022-06-21 ENCOUNTER — Inpatient Hospital Stay (HOSPITAL_COMMUNITY): Payer: PRIVATE HEALTH INSURANCE

## 2022-06-21 ENCOUNTER — Telehealth: Payer: Self-pay | Admitting: Family Medicine

## 2022-06-21 DIAGNOSIS — N3000 Acute cystitis without hematuria: Secondary | ICD-10-CM

## 2022-06-21 DIAGNOSIS — I10 Essential (primary) hypertension: Secondary | ICD-10-CM

## 2022-06-21 DIAGNOSIS — Z794 Long term (current) use of insulin: Secondary | ICD-10-CM

## 2022-06-21 DIAGNOSIS — M869 Osteomyelitis, unspecified: Secondary | ICD-10-CM

## 2022-06-21 DIAGNOSIS — N179 Acute kidney failure, unspecified: Secondary | ICD-10-CM

## 2022-06-21 DIAGNOSIS — E1122 Type 2 diabetes mellitus with diabetic chronic kidney disease: Secondary | ICD-10-CM | POA: Diagnosis not present

## 2022-06-21 DIAGNOSIS — N185 Chronic kidney disease, stage 5: Secondary | ICD-10-CM

## 2022-06-21 LAB — CBC WITH DIFFERENTIAL/PLATELET
Abs Immature Granulocytes: 0.03 10*3/uL (ref 0.00–0.07)
Basophils Absolute: 0.1 10*3/uL (ref 0.0–0.1)
Basophils Relative: 1 %
Eosinophils Absolute: 0.2 10*3/uL (ref 0.0–0.5)
Eosinophils Relative: 2 %
HCT: 31.3 % — ABNORMAL LOW (ref 36.0–46.0)
Hemoglobin: 9.5 g/dL — ABNORMAL LOW (ref 12.0–15.0)
Immature Granulocytes: 0 %
Lymphocytes Relative: 12 %
Lymphs Abs: 1.2 10*3/uL (ref 0.7–4.0)
MCH: 26.9 pg (ref 26.0–34.0)
MCHC: 30.4 g/dL (ref 30.0–36.0)
MCV: 88.7 fL (ref 80.0–100.0)
Monocytes Absolute: 1 10*3/uL (ref 0.1–1.0)
Monocytes Relative: 10 %
Neutro Abs: 7.4 10*3/uL (ref 1.7–7.7)
Neutrophils Relative %: 75 %
Platelets: 221 10*3/uL (ref 150–400)
RBC: 3.53 MIL/uL — ABNORMAL LOW (ref 3.87–5.11)
RDW: 15.4 % (ref 11.5–15.5)
WBC: 9.9 10*3/uL (ref 4.0–10.5)
nRBC: 0 % (ref 0.0–0.2)

## 2022-06-21 LAB — ECHOCARDIOGRAM COMPLETE
AR max vel: 2.15 cm2
AV Area VTI: 2.53 cm2
AV Area mean vel: 2.09 cm2
AV Mean grad: 4 mmHg
AV Peak grad: 6.4 mmHg
Ao pk vel: 1.26 m/s
Area-P 1/2: 2.87 cm2
Height: 61 in
S' Lateral: 3.13 cm
Weight: 2704 oz

## 2022-06-21 LAB — COMPREHENSIVE METABOLIC PANEL
ALT: 8 U/L (ref 0–44)
AST: 16 U/L (ref 15–41)
Albumin: 2.7 g/dL — ABNORMAL LOW (ref 3.5–5.0)
Alkaline Phosphatase: 87 U/L (ref 38–126)
Anion gap: 7 (ref 5–15)
BUN: 52 mg/dL — ABNORMAL HIGH (ref 8–23)
CO2: 19 mmol/L — ABNORMAL LOW (ref 22–32)
Calcium: 8.9 mg/dL (ref 8.9–10.3)
Chloride: 113 mmol/L — ABNORMAL HIGH (ref 98–111)
Creatinine, Ser: 3.8 mg/dL — ABNORMAL HIGH (ref 0.44–1.00)
GFR, Estimated: 13 mL/min — ABNORMAL LOW (ref >60)
Glucose, Bld: 93 mg/dL (ref 70–99)
Potassium: 4.8 mmol/L (ref 3.5–5.1)
Sodium: 139 mmol/L (ref 135–145)
Total Bilirubin: 0.5 mg/dL (ref 0.3–1.2)
Total Protein: 7.5 g/dL (ref 6.5–8.1)

## 2022-06-21 LAB — C-REACTIVE PROTEIN: CRP: 2 mg/dL — ABNORMAL HIGH (ref <1.0)

## 2022-06-21 LAB — CBG MONITORING, ED
Glucose-Capillary: 87 mg/dL (ref 70–99)
Glucose-Capillary: 112 mg/dL — ABNORMAL HIGH (ref 70–99)

## 2022-06-21 LAB — GLUCOSE, CAPILLARY
Glucose-Capillary: 107 mg/dL — ABNORMAL HIGH (ref 70–99)
Glucose-Capillary: 103 mg/dL — ABNORMAL HIGH (ref 70–99)

## 2022-06-21 MED ORDER — GADOBUTROL 1 MMOL/ML IV SOLN
7.5000 mL | Freq: Once | INTRAVENOUS | Status: AC | PRN
  Administered 2022-06-21: 7.5 mL via INTRAVENOUS

## 2022-06-21 MED ORDER — HEPARIN SODIUM (PORCINE) 5000 UNIT/ML IJ SOLN
5000.0000 [IU] | Freq: Three times a day (TID) | INTRAMUSCULAR | Status: DC
  Administered 2022-06-21: 5000 [IU] via SUBCUTANEOUS
  Filled 2022-06-21: qty 1

## 2022-06-21 MED ORDER — VANCOMYCIN VARIABLE DOSE PER UNSTABLE RENAL FUNCTION (PHARMACIST DOSING)
Status: DC
Start: 2022-06-21 — End: 2022-06-21

## 2022-06-21 MED ORDER — MOMETASONE FURO-FORMOTEROL FUM 200-5 MCG/ACT IN AERO
2.0000 | INHALATION_SPRAY | Freq: Two times a day (BID) | RESPIRATORY_TRACT | Status: DC
  Administered 2022-06-21 – 2022-06-27 (×14): 2 via RESPIRATORY_TRACT
  Filled 2022-06-21 (×2): qty 8.8

## 2022-06-21 MED ORDER — CARVEDILOL 6.25 MG PO TABS
6.2500 mg | ORAL_TABLET | Freq: Every day | ORAL | Status: DC
  Administered 2022-06-21 – 2022-06-25 (×5): 6.25 mg via ORAL
  Filled 2022-06-21: qty 1
  Filled 2022-06-21: qty 2
  Filled 2022-06-21 (×4): qty 1

## 2022-06-21 MED ORDER — NALOXONE HCL 0.4 MG/ML IJ SOLN
0.4000 mg | INTRAMUSCULAR | Status: DC | PRN
Start: 2022-06-21 — End: 2022-07-11

## 2022-06-21 MED ORDER — SODIUM CHLORIDE 0.9 % IV SOLN: INTRAVENOUS | Status: AC

## 2022-06-21 MED ORDER — OXYCODONE HCL 5 MG PO TABS: 5.0000 mg | ORAL_TABLET | ORAL | Status: DC | PRN

## 2022-06-21 MED ORDER — INSULIN ASPART 100 UNIT/ML IJ SOLN
0.0000 [IU] | Freq: Every day | INTRAMUSCULAR | Status: DC
  Filled 2022-06-21: qty 0.05

## 2022-06-21 MED ORDER — ALBUTEROL SULFATE (2.5 MG/3ML) 0.083% IN NEBU: 3.0000 mL | INHALATION_SOLUTION | Freq: Four times a day (QID) | RESPIRATORY_TRACT | Status: DC | PRN

## 2022-06-21 MED ORDER — SODIUM CHLORIDE 0.9 % IV SOLN
2.0000 g | INTRAVENOUS | Status: DC
  Administered 2022-06-21 – 2022-06-25 (×5): 2 g via INTRAVENOUS
  Filled 2022-06-21 (×5): qty 20

## 2022-06-21 MED ORDER — INSULIN GLARGINE-YFGN 100 UNIT/ML ~~LOC~~ SOLN
18.0000 [IU] | Freq: Every day | SUBCUTANEOUS | Status: DC
  Filled 2022-06-21: qty 0.18

## 2022-06-21 MED ORDER — DIAZEPAM 5 MG PO TABS
5.0000 mg | ORAL_TABLET | Freq: Every day | ORAL | Status: DC | PRN
  Administered 2022-06-21 – 2022-07-08 (×8): 5 mg via ORAL
  Filled 2022-06-21 (×9): qty 1

## 2022-06-21 MED ORDER — TRIMETHOBENZAMIDE HCL 100 MG/ML IM SOLN
200.0000 mg | Freq: Four times a day (QID) | INTRAMUSCULAR | Status: DC | PRN
  Administered 2022-06-21: 200 mg via INTRAMUSCULAR
  Filled 2022-06-21 (×2): qty 2

## 2022-06-21 MED ORDER — HYDROMORPHONE HCL 2 MG/ML IJ SOLN
1.0000 mg | Freq: Once | INTRAMUSCULAR | Status: AC | PRN
  Administered 2022-06-21: 1 mg via INTRAVENOUS
  Filled 2022-06-21: qty 1

## 2022-06-21 MED ORDER — OXYCODONE HCL 5 MG PO TABS
5.0000 mg | ORAL_TABLET | ORAL | Status: DC | PRN
  Administered 2022-06-21 – 2022-06-24 (×13): 5 mg via ORAL
  Filled 2022-06-21 (×13): qty 1

## 2022-06-21 MED ORDER — DULOXETINE HCL 60 MG PO CPEP
60.0000 mg | ORAL_CAPSULE | Freq: Every day | ORAL | Status: DC
  Administered 2022-06-21 – 2022-07-11 (×21): 60 mg via ORAL
  Filled 2022-06-21: qty 1
  Filled 2022-06-21 (×2): qty 2
  Filled 2022-06-21 (×2): qty 1
  Filled 2022-06-21: qty 2
  Filled 2022-06-21: qty 1
  Filled 2022-06-21 (×3): qty 2
  Filled 2022-06-21: qty 1
  Filled 2022-06-21: qty 2
  Filled 2022-06-21 (×9): qty 1

## 2022-06-21 MED ORDER — INSULIN ASPART 100 UNIT/ML IJ SOLN
0.0000 [IU] | Freq: Three times a day (TID) | INTRAMUSCULAR | Status: DC
  Filled 2022-06-21: qty 0.15

## 2022-06-21 MED ORDER — ONDANSETRON HCL 4 MG/2ML IJ SOLN
4.0000 mg | Freq: Four times a day (QID) | INTRAMUSCULAR | Status: DC | PRN
  Administered 2022-06-21 – 2022-06-27 (×6): 4 mg via INTRAVENOUS
  Filled 2022-06-21 (×6): qty 2

## 2022-06-21 MED ORDER — MONTELUKAST SODIUM 10 MG PO TABS
10.0000 mg | ORAL_TABLET | Freq: Every day | ORAL | Status: DC
  Administered 2022-06-21 – 2022-07-10 (×19): 10 mg via ORAL
  Filled 2022-06-21 (×19): qty 1

## 2022-06-21 MED ORDER — VANCOMYCIN HCL 1500 MG/300ML IV SOLN
1500.0000 mg | Freq: Once | INTRAVENOUS | Status: AC
  Administered 2022-06-21: 1500 mg via INTRAVENOUS
  Filled 2022-06-21: qty 300

## 2022-06-21 MED ORDER — HYDROMORPHONE HCL 2 MG/ML IJ SOLN
1.0000 mg | INTRAMUSCULAR | Status: AC | PRN
  Administered 2022-06-21: 1 mg via INTRAVENOUS
  Filled 2022-06-21: qty 1

## 2022-06-21 MED ORDER — INSULIN ASPART 100 UNIT/ML IJ SOLN
0.0000 [IU] | INTRAMUSCULAR | Status: DC
  Administered 2022-06-22: 3 [IU] via SUBCUTANEOUS
  Administered 2022-06-23 – 2022-06-26 (×6): 1 [IU] via SUBCUTANEOUS
  Administered 2022-06-29: 2 [IU] via SUBCUTANEOUS
  Administered 2022-06-29 (×2): 1 [IU] via SUBCUTANEOUS
  Administered 2022-06-29: 2 [IU] via SUBCUTANEOUS
  Administered 2022-06-30: 1 [IU] via SUBCUTANEOUS
  Administered 2022-07-01: 3 [IU] via SUBCUTANEOUS
  Administered 2022-07-01 – 2022-07-02 (×2): 1 [IU] via SUBCUTANEOUS
  Administered 2022-07-03: 2 [IU] via SUBCUTANEOUS
  Administered 2022-07-04 – 2022-07-06 (×4): 1 [IU] via SUBCUTANEOUS
  Filled 2022-06-21: qty 0.09

## 2022-06-21 MED ORDER — PREGABALIN 75 MG PO CAPS
75.0000 mg | ORAL_CAPSULE | Freq: Three times a day (TID) | ORAL | Status: DC
  Administered 2022-06-21 – 2022-06-24 (×12): 75 mg via ORAL
  Filled 2022-06-21 (×12): qty 1

## 2022-06-21 NOTE — Progress Notes (Signed)
Patient seen and examined.  Admitted early morning hours by nighttime hospitalist.  For detailed history and chronology of events please refer to Dr. Elon Jester documentation. Patient with extensive medical issues including spinal fixation/loosening hardware/Pseudomonas infection and osteomyelitis now presenting with nausea vomiting and UTI symptoms.  Nausea vomiting is improving, currently on Rocephin for UTI. Significant back pain issues, difficulty with procedures as outpatient. Called and discussed case with infectious disease, they are following. Called and discussed with Dr. Duffy Rhody her neurosurgeon.  They think this may be postinflammatory changes.  However she is in the hospital will try to arrange T10/T11 disc space biopsy if possible and neurosurgery will follow-up. Husband at the bedside.  Updated. We will keep n.p.o. for possible procedure, otherwise we will let her eat if unable to do it today.   Same-day admit.  No charge visit.

## 2022-06-21 NOTE — Consult Note (Signed)
[name]    Date of Admission:  06/20/2022     Reason for Consult: Discitis/OM     Referring Physician: Dr Sloan Leiter  Current antibiotics: Ceftriaxone Vancomycin x 1 dose  ASSESSMENT:    66 y.o. adult admitted with:  Possible discitis/osteomyelitis at T10-T11: This was noted on prior MRI in July 2023 that was obtained by her neurosurgeon with possible findings of discitis/osteomyelitis and hardware failure.  Infection versus postinflammatory changes is unclear.  Her inflammatory markers remain elevated but these are difficult to interpret with other ongoing issues including AKI, UTI, and foot ulcer with ongoing wound care.  Planning for IR guided disc space aspiration for further information. Urinary tract infection: Patient reports symptoms consistent with UTI and her urinalysis is suggestive of such.  Cultures pending and currently on ceftriaxone. 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 Type 2 diabetes: Hemoglobin A1c has been stable. Prior lumbar spine infection: Due to Pseudomonas and treated.  RECOMMENDATIONS:    Continue ceftriaxone for UTI treatment and follow cultures Stop vancomycin Ideally would perform disc space aspiration after several days off antibiotics.  However, this will likely be quite challenging to do as an outpatient so this is planned to be done during her admission here Following disc space aspiration and optimization of her other medical problems, anticipate that she will likely be able to follow-up as an outpatient with neurosurgery as well as ID.  She has a relationship with Dr. Megan Salon in our clinic and would like to follow-up with him if possible. Lab monitoring Will follow, Dr Candiss Norse back on service tomorrow.   Principal Problem:   Osteomyelitis ([place]) Active Problems:   Essential hypertension   UTI (urinary tract infection)   AKI (acute kidney injury) (HCC)    Nausea & vomiting   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)   Depression   Asthma   Anemia   DM (diabetes mellitus), type 2 with renal complications (HCC)   MEDICATIONS:    Scheduled Meds:  carvedilol  6.25 mg Oral Daily   DULoxetine  60 mg Oral Daily   insulin aspart  0-9 Units Subcutaneous Q4H   insulin glargine-yfgn  18 Units Subcutaneous QHS   mometasone-formoterol  2 puff Inhalation BID   montelukast  10 mg Oral QHS   pregabalin  75 mg Oral TID   Continuous Infusions:  sodium chloride 125 mL/hr at 06/21/22 0739   cefTRIAXone (ROCEPHIN)  IV     PRN Meds:.albuterol, diazepam, naLOXone (NARCAN)  injection, ondansetron (ZOFRAN) IV, oxyCODONE  HPI:    [name] [name] is a 66 y.o. adult with multiple complex medical problems including CKD stage IV, iron deficiency, prior DVT, diabetes, hypertension, history of prior Pseudomonas lumbar spine infection, diabetic foot ulcers who presented to the emergency department last night with worsening lumbar back pain, nausea, vomiting, and urinary tract infection symptoms.  She underwent imaging with a CT scan showing T10-T11 disc space irregularity and paravertebral soft tissue thickening with pedicle screws extending into the region of disc abnormality.  It was unclear if this represented acute infection or not.  Given her UTI symptoms, she also had a UA that showed pyuria, many bacteria.  Urine culture was obtained and is pending.  I was contacted overnight and recommended holding broad-spectrum antibiotics but given her symptoms seem reasonable to treat her UTI.  She was subsequently given a dose of vancomycin as well this  morning in addition to ceftriaxone.  Her urine culture is currently pending.  Blood cultures were not obtained.  This morning, she continues to report dysuria and increased frequency.  Her nausea/vomiting symptoms are improving.  She also reports worsening back pain over the past several weeks.  This  has been evaluated by her neurosurgeon (Dr. Marcello Moores).  She underwent MRI on July 15 that showed findings concerning for T10-11 discitis/osteomyelitis with endplate erosion resulting in displacement of the bilateral T11 pedicular screws into the disc space.  However, it is not clear if this was postinflammatory changes and hardware failure versus infection further leading to hardware failure.  As noted she has a history of spinal fixation and loosening hardware with prior Pseudomonas infection.  She has also been getting wound care for her bilateral foot ulcers and reports these have been improving.  She had a another MRI this morning more or less showing the same, however, the area of concern was not completely imaged as this was a lumbar MRI but possibly was mildly improved.   Past Medical History:  Diagnosis Date   Acute MI [facility]) 2007   presented to ED & had cardiac cath- but found to have normal coronaries. Since that point in time her PCP cares f or cardiac needs. Dr. 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   Retinopathy 04/17/2019   Sleep apnea 11/16/2017   SOB (shortness of breath) 10/11/2020   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   Qualifier: Diagnosis of  By: Charlott Holler CMA, [place]     Type 2 diabetes mellitus with hyperglycemia, with long-term current use of insulin ([place]) 04/06/2017    Social History   Tobacco Use   Smoking status: Never    Passive exposure: Never   Smokeless tobacco: Never  Vaping Use   Vaping Use: Never used  Substance Use Topics   Alcohol use: No    Alcohol/week: 0.0 standard drinks of alcohol   Drug use: No    Family History  Problem Relation Age of  Onset   Heart disease Father    Diabetes Father    Colitis Father    Crohn's disease Father    Cancer Father        leukemia   Leukemia Father    Diabetes Mellitus II Brother    Kidney disease Brother    Heart disease Brother    Diabetes Mother    Hypertension Mother    Mental illness Mother    Irritable bowel syndrome Daughter    Diabetes Mellitus II Brother    Kidney disease Brother    Liver disease Brother    Kidney disease Brother    Heart attack Brother    Diabetes Mellitus II Brother    Heart disease Brother    Liver disease Brother    Kidney disease Brother    Kidney disease Brother    Diabetes Mellitus II Brother    Diabetes Mellitus I Brother     Allergies  Allergen Reactions   Bee Pollen Anaphylaxis   Broccoli [Brassica Oleracea] Anaphylaxis and Hives   Fish-Derived Products Anaphylaxis and Hives   Mushroom Extract Complex Anaphylaxis   Penicillins Anaphylaxis    **Tolerated cefepime March 2021 Did it involve swelling of the face/tongue/throat, SOB, or low  BP? Yes Did it involve sudden or severe rash/hives, skin peeling, or any reaction on the inside of your mouth or nose? No Did you need to seek medical attention at a hospital or doctor's office? Yes When did it last happen?  A few months ago If all above answers are "NO", may proceed with cephalosporin use.   Rosemary Oil Anaphylaxis   Shellfish Allergy Anaphylaxis and Hives   Tomato Anaphylaxis and Hives    Raw tomatoes only, can tolerate if cooked   Acetaminophen Other (See Comments)    GI upset   Aloe Vera Hives   Acyclovir And Related Other (See Comments)    Unknown reaction   Naproxen Hives    Review of Systems  All other systems reviewed and are negative.  Except as otherwise noted above in the HPI  OBJECTIVE:   Blood pressure (!) 113/54, pulse 71, temperature 98 F (36.7 C), temperature source Oral, resp. rate 16, height 5' 1"  (1.549 m), weight 76.7 kg, SpO2 91 %. Body mass index is 31.93 kg/m.  Physical Exam Constitutional:      General: She is not in acute distress.    Appearance: Normal appearance.  HENT:     Head: Normocephalic and atraumatic.  Eyes:     Extraocular Movements: Extraocular movements intact.     Conjunctiva/sclera: Conjunctivae normal.  Cardiovascular:     Rate and Rhythm: Normal rate and regular rhythm.  Pulmonary:     Effort: Pulmonary effort is normal. No respiratory distress.  Abdominal:     General: There is no distension.     Palpations: Abdomen is soft.     Tenderness: There is no abdominal tenderness.  Musculoskeletal:     Cervical back: Normal range of motion and neck supple.     Right lower leg: No edema.     Left lower leg: No edema.  Skin:    General: Skin is warm and dry.  Neurological:     General: No focal deficit present.     Mental Status: She is alert and oriented to person, place, and time.     Motor: No weakness.  Psychiatric:        Mood and Affect: Mood normal.        Behavior:  Behavior normal.      Lab  Results: Lab Results  Component Value Date   WBC 9.9 06/21/2022   HGB 9.5 (L) 06/21/2022   HCT 31.3 (L) 06/21/2022   MCV 88.7 06/21/2022   PLT 221 06/21/2022    Lab Results  Component Value Date   NA 139 06/21/2022   K 4.8 06/21/2022   CO2 19 (L) 06/21/2022   GLUCOSE 93 06/21/2022   BUN 52 (H) 06/21/2022   CREATININE 3.80 (H) 06/21/2022   CALCIUM 8.9 06/21/2022   GFRNONAA 13 (L) 06/21/2022   GFRAA 31 (L) 06/16/2020    Lab Results  Component Value Date   ALT 8 06/21/2022   AST 16 06/21/2022   ALKPHOS 87 06/21/2022   BILITOT 0.5 06/21/2022       Component Value Date/Time   CRP 2.0 (H) 06/20/2022 1813       Component Value Date/Time   ESRSEDRATE 126 (H) 06/20/2022 1813    I have reviewed the micro and lab results in Epic.  Imaging: ECHOCARDIOGRAM COMPLETE  Result Date: 06/21/2022    ECHOCARDIOGRAM REPORT   Patient Name:   CHEKESHA BEHLKE Date of Exam: 06/21/2022 Medical Rec #:  [id number]         Height:       61.0 in Accession #:    [id number]        Weight:       160.0 lb Date of Birth:  August 24, 1956         BSA:          1.718 m Patient Age:    32 years          BP:           126/59 mmHg Patient Gender: F                 HR:           77 bpm. Exam Location:  Inpatient Procedure: 2D Echo, Cardiac Doppler and Color Doppler Indications:    Osteomyelitis  History:        Patient has prior history of Echocardiogram examinations, most                 recent 01/07/2022. CAD, Signs/Symptoms:Shortness of Breath; Risk                 Factors:Diabetes, Hypertension and Dyslipidemia.  Sonographer:    Memory Argue Referring Phys: [place]  1. Poor acoustic windows limit study.  2. Left ventricular ejection fraction, by estimation, is 65 to 70%. The left ventricle has normal function. The left ventricle has no regional wall motion abnormalities. There is moderate asymmetric left ventricular hypertrophy.  3. Right ventricular systolic function is normal. The right  ventricular size is normal.  4. Trivial mitral valve regurgitation.  5. The aortic valve was not well visualized. Aortic valve regurgitation is not visualized. Comparison(s): The left ventricular function is unchanged. FINDINGS  Left Ventricle: Left ventricular ejection fraction, by estimation, is 65 to 70%. The left ventricle has normal function. The left ventricle has no regional wall motion abnormalities. The left ventricular internal cavity size was normal in size. There is  moderate asymmetric left ventricular hypertrophy. Right Ventricle: The right ventricular size is normal. Right vetricular wall thickness was not assessed. Right ventricular systolic function is normal. Left Atrium: Left atrial size was normal in size. Right Atrium: Right atrial size was normal in size. Pericardium: There is no evidence of pericardial effusion.  Mitral Valve: There is mild thickening of the mitral valve leaflet(s). Mild mitral annular calcification. Trivial mitral valve regurgitation. Tricuspid Valve: The tricuspid valve is not well visualized. Aortic Valve: The aortic valve was not well visualized. Aortic valve regurgitation is not visualized. Aortic valve mean gradient measures 4.0 mmHg. Aortic valve peak gradient measures 6.4 mmHg. Aortic valve area, by VTI measures 2.53 cm. Pulmonic Valve: The pulmonic valve was not well visualized. Aorta: The aortic root and ascending aorta are structurally normal, with no evidence of dilitation. IAS/Shunts: The interatrial septum was not assessed.  LEFT VENTRICLE PLAX 2D LVIDd:         4.80 cm LVIDs:         3.13 cm LV PW:         1.30 cm LV IVS:        1.60 cm LVOT diam:     2.30 cm LV SV:         73 LV SV Index:   43 LVOT Area:     4.15 cm  RIGHT VENTRICLE TAPSE (M-mode): 2.2 cm LEFT ATRIUM             Index LA diam:        4.70 cm 2.74 cm/m LA Vol (A2C):   36.4 ml 21.19 ml/m LA Vol (A4C):   53.9 ml 31.38 ml/m LA Biplane Vol: 43.2 ml 25.15 ml/m  AORTIC VALVE AV Area (Vmax):     2.15 cm AV Area (Vmean):   2.09 cm AV Area (VTI):     2.53 cm AV Vmax:           126.00 cm/s AV Vmean:          98.400 cm/s AV VTI:            0.289 m AV Peak Grad:      6.4 mmHg AV Mean Grad:      4.0 mmHg LVOT Vmax:         65.30 cm/s LVOT Vmean:        49.500 cm/s LVOT VTI:          0.176 m LVOT/AV VTI ratio: 0.61  AORTA Ao Root diam: 3.40 cm MITRAL VALVE MV Area (PHT): 2.87 cm    SHUNTS MV Decel Time: 264 msec    Systemic VTI:  0.18 m MV E velocity: 67.70 cm/s  Systemic Diam: 2.30 cm MV A velocity: 89.50 cm/s MV E/A ratio:  0.76 MV A Prime:    5.9 cm/s Dorris Carnes MD Electronically signed by Dorris Carnes MD Signature Date/Time: 06/21/2022/12:10:32 PM    Final    MR Lumbar Spine W Wo Contrast  Result Date: 06/21/2022 CLINICAL DATA:  Low back pain, infection suspected, positive xray/CT EXAM: MRI LUMBAR SPINE WITHOUT AND WITH CONTRAST TECHNIQUE: Multiplanar and multiecho pulse sequences of the lumbar spine were obtained without and with intravenous contrast. CONTRAST:  7.92m GADAVIST GADOBUTROL 1 MMOL/ML IV SOLN COMPARISON:  MRI of the lumbar spine 05/13/2022. FINDINGS: Segmentation: Standard segmentation is assumed. Same numbering as on prior. Alignment:  No substantial sagittal subluxation. Vertebrae: Prior T11 through L3 posterior fusion. Incompletely imaged edema within the T11 vertebral body and surrounding paraspinous musculature, which remains concerning for discitis/osteomyelitis. This may be mildly improved relative to 05/13/2022 but is incompletely characterized (the T10 vertebral body and T10-T11 disc space are not imaged and there is poor visualization of the canal at this level). Conus medullaris and cauda equina: Conus and cauda equina are partially obscured by metallic artifact.  Paraspinal and other soft tissues: Incompletely imaged paraspinous edema in the lower thoracic spine. The extent of edema is probably improved from the prior. Disc levels: Unchanged multilevel superimposed degenerative  change, as characterized on prior MRI. IMPRESSION: Incompletely imaged edema within the T11 vertebral body and surrounding paraspinous musculature, which remains concerning for discitis/osteomyelitis. This may be mildly improved relative to 05/13/2022 but is incompletely characterized (the T10 vertebral body and T10-T11 disc space are not imaged and there is poor visualization of the canal at these levels). Recommend MRI of the thoracic spine with contrast for further characterization. Electronically Signed   By: Margaretha Sheffield M.D.   On: 06/21/2022 12:04   CT ABDOMEN PELVIS WO CONTRAST  Result Date: 06/20/2022 CLINICAL DATA:  Acute abdominal pain.  Nausea and vomiting. EXAM: CT ABDOMEN AND PELVIS WITHOUT CONTRAST TECHNIQUE: Multidetector CT imaging of the abdomen and pelvis was performed following the standard protocol without IV contrast. RADIATION DOSE REDUCTION: This exam was performed according to the departmental dose-optimization program which includes automated exposure control, adjustment of the mA and/or kV according to patient size and/or use of iterative reconstruction technique. COMPARISON:  Most recent CT 03/25/2022.  MRI 05/13/2022 FINDINGS: Lower chest: No acute airspace disease, no pleural effusion. Hepatobiliary: Calcified granuloma in the liver. Nodular hepatic contours. No evidence of focal liver lesion on this unenhanced exam. Clips in the gallbladder fossa postcholecystectomy. No biliary dilatation. Pancreas: No ductal dilatation or inflammation. Spleen: Upper normal in size spanning 12.2 x 5.5 x 12.7 cm (volume = 450 cm^3). Calcified granuloma. Adrenals/Urinary Tract: No adrenal nodule. Chronic perinephric edema. No hydronephrosis. Calcification in the upper right kidney may be nonobstructing stone or vascular. No evidence of focal renal abnormality on this unenhanced exam. Unremarkable urinary bladder. Stomach/Bowel: Decompressed stomach. There is a small duodenal diverticulum. No  bowel obstruction or inflammation. Normal appendix. Small to moderate colonic stool burden. There is sigmoid colonic redundancy. Vascular/Lymphatic: Moderate aortic atherosclerosis. No aortic aneurysm. Smooth aortic contours. Small retroperitoneal lymph nodes, not enlarged by size criteria. Reproductive: Hysterectomy.  No adnexal mass. Other: No ascites or free air. Small fat containing umbilical hernia. Mild rectus diastasis. Musculoskeletal: T10-T11 disc space irregularity, lucency, and paravertebral soft tissue thickening. There is paravertebral soft tissue edema. The superior pedicle screws extend into the region of disc abnormality. This is at site of prior MRI findings consistent with discitis osteomyelitis. Prior L1 corpectomy. IMPRESSION: 1. No acute abnormality in the abdomen/pelvis. 2. T10-T11 disc space irregularity, lucency, and paravertebral soft tissue thickening. 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 This may represent persistent or residual infection, although the degree of soft tissue edema is suspicious for on going infection. The superior pedicle screws extend into the region of disc abnormality. 3. Cirrhotic hepatic morphology.  Borderline splenomegaly. 4. Nonobstructing right renal stone versus vascular calcification. Aortic Atherosclerosis (ICD10-I70.0). Electronically Signed   By: Keith Rake M.D.   On: 06/20/2022 17:49     Imaging independently reviewed in Epic.  [facility]ious [facility] [phone] pager 06/21/2022, 1:07 PM

## 2022-06-21 NOTE — ED Notes (Signed)
Echo at bedside. Temp will be updated once echo is completed.

## 2022-06-21 NOTE — Telephone Encounter (Signed)
[name] [name]. [id number]  Ivin Booty, the case manager for the patient's insurance, is calling to get an update on the paperwork faxed over to [name] on Aug 5th. She states that [place] received paperwork from [name] for the electric wheelchair but it was missing a signature and therefore faxed backed to our office to get it signed on aug 5th. Informed case manager I did not see anything regarding the wheelchair but I would send a message and requested the form be faxed to [name] again. Ivin Booty stated she cannot fax it to [name] but if we needed it faxed again, Andria Rhein is in charge of the patient's case at [employer] and she can be reached at [phone]. Please advise

## 2022-06-21 NOTE — ED Notes (Signed)
Spoke to MRI, patient states she does not want to go to MRI without her husband being back because she is scared of falling. This nurse reassured patients safety. MRI staff made aware.

## 2022-06-21 NOTE — Care Plan (Signed)
Once again attempted to get pt for MRI. Pt refused to come down for her time slot. Mri will proceed with schedule for utilization of their day. RN informed we have no guarantee when we can get back to her for a scan. This could be as late at this afternoon or tonight.

## 2022-06-21 NOTE — ED Notes (Signed)
Patient transported to MRI 

## 2022-06-21 NOTE — Progress Notes (Signed)
Pharmacy Antibiotic Note  [name] [name] is a 66 y.o. adult admitted on 06/20/2022 with past medical history of CKD stage 3b, osteomyelitis, hypertension, hyperlipidemia, and epigastric hernia presenting to the emergency department with a 4-day history of nausea, vomiting, and abdominal pain.  She also reports having chronic back pain.  Pharmacy has been consulted for vancomycin for osteomyelitis.  Plan: Vancomycin 1577m IV x 1 then obtain random vanc level in 24-48 hours for further dosing Scr 4.2     Temp (24hrs), Avg:98.4 F (36.9 C), Min:98.3 F (36.8 C), Max:98.5 F (36.9 C)  Recent Labs  Lab 06/20/22 1813 06/20/22 1822  WBC 10.9*  --   CREATININE 3.88* 4.20*    CrCl cannot be calculated (Unknown ideal weight.).    Allergies  Allergen Reactions   Bee Pollen Anaphylaxis   Broccoli [Brassica Oleracea] Anaphylaxis and Hives   Fish-Derived Products Anaphylaxis and Hives   Mushroom Extract Complex Anaphylaxis   Penicillins Anaphylaxis    **Tolerated cefepime March 2021 Did it involve swelling of the face/tongue/throat, SOB, or low BP? Yes Did it involve sudden or severe rash/hives, skin peeling, or any reaction on the inside of your mouth or nose? No Did you need to seek medical attention at a hospital or doctor's office? Yes When did it last happen?  A few months ago If all above answers are "NO", may proceed with cephalosporin use.   Rosemary Oil Anaphylaxis   Shellfish Allergy Anaphylaxis and Hives   Tomato Anaphylaxis and Hives    Raw tomatoes only, can tolerate if cooked   Acetaminophen Other (See Comments)    GI upset   Aloe Vera Hives   Acyclovir And Related Other (See Comments)    Unknown reaction   Naproxen Hives    Antimicrobials this admission: 8/22 CTX >> 8/23 vanc >>  Dose adjustments this admission:   Microbiology results: 8/22 UCx:   Thank you for allowing pharmacy to be a part of this patient's care.  EDolly RiasRPh 06/21/2022, 1:08  AM

## 2022-06-21 NOTE — Care Plan (Signed)
Attempted to get patient for her MRI, per RN patient is refusing to come to MRI because she is afraid we will drop her and wants to wait until her husband arrives. We explained to RN that we will have to bump her and move forward with other patients and cannot leave scanner open for her. Patient will be worked back into the schedule later today.
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
## 2022-06-21 NOTE — Plan of Care (Signed)

## 2022-06-22 ENCOUNTER — Inpatient Hospital Stay (HOSPITAL_COMMUNITY): Payer: PRIVATE HEALTH INSURANCE

## 2022-06-22 ENCOUNTER — Encounter: Admit: 2022-06-22 | Discharge: 2022-06-22 | Payer: MEDICARE

## 2022-06-22 DIAGNOSIS — M869 Osteomyelitis, unspecified: Secondary | ICD-10-CM | POA: Diagnosis not present

## 2022-06-22 LAB — URINE CULTURE: Culture: >=100000 — AB

## 2022-06-22 LAB — GLUCOSE, CAPILLARY
Glucose-Capillary: 207 mg/dL — ABNORMAL HIGH (ref 70–99)
Glucose-Capillary: 68 mg/dL — ABNORMAL LOW (ref 70–99)
Glucose-Capillary: 71 mg/dL (ref 70–99)
Glucose-Capillary: 72 mg/dL (ref 70–99)
Glucose-Capillary: 73 mg/dL (ref 70–99)
Glucose-Capillary: 90 mg/dL (ref 70–99)
Glucose-Capillary: 94 mg/dL (ref 70–99)

## 2022-06-22 MED ORDER — ORAL CARE MOUTH RINSE: 15.0000 mL | OROMUCOSAL | Status: DC | PRN

## 2022-06-22 MED ORDER — GADOBUTROL 1 MMOL/ML IV SOLN
7.5000 mL | Freq: Once | INTRAVENOUS | Status: AC | PRN
  Administered 2022-06-22: 7.5 mL via INTRAVENOUS

## 2022-06-22 MED ORDER — FENTANYL CITRATE PF 50 MCG/ML IJ SOSY
25.0000 ug | PREFILLED_SYRINGE | Freq: Once | INTRAMUSCULAR | Status: AC
  Administered 2022-06-22: 25 ug via INTRAVENOUS
  Filled 2022-06-22: qty 1

## 2022-06-22 MED ORDER — INSULIN GLARGINE-YFGN 100 UNIT/ML ~~LOC~~ SOLN
8.0000 [IU] | Freq: Every day | SUBCUTANEOUS | Status: DC
  Administered 2022-06-22 – 2022-07-10 (×19): 8 [IU] via SUBCUTANEOUS
  Filled 2022-06-22 (×22): qty 0.08

## 2022-06-22 MED ORDER — MUPIROCIN CALCIUM 2 % EX CREA
TOPICAL_CREAM | Freq: Every day | CUTANEOUS | Status: DC
  Administered 2022-06-25 – 2022-07-06 (×2): 1 via TOPICAL
  Filled 2022-06-22 (×2): qty 15

## 2022-06-22 MED ORDER — SODIUM CHLORIDE 0.9 % IV SOLN: INTRAVENOUS | Status: DC

## 2022-06-22 MED ORDER — METHOCARBAMOL 500 MG PO TABS
500.0000 mg | ORAL_TABLET | Freq: Four times a day (QID) | ORAL | Status: DC | PRN
  Administered 2022-06-22 – 2022-06-28 (×13): 500 mg via ORAL
  Filled 2022-06-22 (×13): qty 1

## 2022-06-22 NOTE — Consult Note (Addendum)
[facility] Nurse Consult Note: Reason for Consult: Consult requested for left foot. Wound type: Left plantar heel with chronic full thickness wound; 1X1X.3cm, red and dry with 1 cm dry yellow callous surrounding wound edges, raised above skin level.  Dressing procedure/placement/frequency: Topical treatment orders provided for bedside nurses to perform as follows to provide antimicrobial benefits and promote moist healing: Apply Bactroban to left plantar heel wound Q day, then cover with foam dresing. (Change foam dressing Q 3 days or PRN soiling.) Please re-consult if further assistance is needed.  Thank-you,  Julien Girt MSN, [place], [place], [place], [place]

## 2022-06-22 NOTE — Progress Notes (Signed)
Spoke with IR - pt procedure to be completed tomorrow.  Pt cleared to eat and will be NPO at midnight.

## 2022-06-22 NOTE — Progress Notes (Signed)
Hypoglycemic Event  CBG: 68  Treatment: 4 oz juice/soda  Symptoms: None  Follow-up CBG: Time:4:23 PM  CBG Result:72  Possible Reasons for Event:  NPO for procedure  Comments/MD notified: Dr. Sloan Leiter - see new orders    [name] [name]

## 2022-06-22 NOTE — Telephone Encounter
I called and spoke with the patient regarding her blood pressure. I discussed with her that it might take time for [name] to see the effect of irbesartan. She will continue with the current regimen and reach back with blood pressure readings by Wednesday.    Julious Payer, MD

## 2022-06-22 NOTE — Progress Notes (Signed)
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  Back pain is usually chronic but worsened.  She has been following with neurosurgery as outpatient, she has extensive vertebral damages that has been under investigation. In the emergency room hemodynamically stable.  Found to have abnormal urine.  Also found to have T10-T11 disc space possibly infected so admitted to the hospital.   Assessment & Plan:   Acute UTI present on admission: Urine cultures with Proteus.  Remains on Rocephin that is continued.  Symptomatically improving with improving intake.  Improving nausea and vomiting.  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 CT scan abdomen and pelvis with abnormal thoracic vertebrae. MRI lumbar spine was incomplete to capture thoracic vertebrae. MRI thoracic spine today with and without contrast After discussing with neurosurgery, Dr. Marcello Moores, requested interventional radiology for T10/T11 disc space biopsy and aspiration.  Holding any broad-spectrum antibiotics.  Followed by ID. Very difficult to schedule as outpatient because of patient's pain and bedbound status. Will be seen by Dr. Marcello Moores from neurosurgery.  Acute kidney injury on CKD stage IV: CT scan with no obstructive lesions.  Continue IV hydration. Baseline creatinine about 2.5-3.  Urine output is improving.  Recheck tomorrow morning.  Chronic diastolic  heart failure: Appears euvolemic.  Holding Lasix.  Gentle IV fluids today.  Chronic DVTs of bilateral lower extremities: Last dose of Eliquis on 8/20.  Holding Eliquis for procedure.  Will resume as soon as possible.  Type 2 diabetes: Well-controlled.  On insulin from home.  Hypertension: Well-controlled.        DVT prophylaxis: SCDs Start: 06/22/22 1037   Code Status: Full code Family Communication: Husband at the bedside 8/23 Disposition Plan: Status is: Inpatient Remains inpatient appropriate because: Inpatient procedures needed.  IV fluids.  Antibiotics.     Consultants:  Neurosurgery Infectious disease  Procedures:  None  Antimicrobials:  Rocephin 8/22---   Subjective: Examined patient at the bedside.  No overnight events.  Afebrile.  Nausea and vomiting has improved.  Intake has improved.  Urinating better now.  Back pain persist.  Objective: Vitals:   06/22/22 0503 06/22/22 0858 06/22/22 0901 06/22/22 0929  BP: (!) 150/52   (!) 154/52  Pulse: 76   72  Resp: 18   16  Temp: 98.3 F (36.8 C)     TempSrc:      SpO2: 92% 90% 90% 99%  Weight:      Height:        Intake/Output Summary (Last 24 hours) at 06/22/2022 1043 Last data filed at 06/22/2022 1038 Gross per 24 hour  Intake 1018.01 ml  Output 400 ml  Net 618.01 ml   Filed Weights   06/21/22 0800 06/21/22 1053 06/22/22 0500  Weight: 76.7 kg 76.7 kg 77.3 kg    Examination:  General: Chronically sick looking.  Frail and debilitated.  Not in any distress.  On room air. Cardiovascular: S1-S2 normal.  Regular rate rhythm. Respiratory: Bilateral clear. Gastrointestinal: Soft and nontender.  Obese and pendulous. Ext: Extreme  weakness both lower extremities.  No deformities but internally rotated.  No swelling or edema. Neuro: Generally weak.  She is alert awake and oriented x4.     Data Reviewed: I have personally reviewed following labs and imaging studies  CBC: Recent Labs  Lab 06/20/22 1813  06/20/22 1822 06/21/22 0448  WBC 10.9*  --  9.9  NEUTROABS 9.4*  --  7.4  HGB 11.1* 11.9* 9.5*  HCT 35.7* 35.0* 31.3*  MCV 86.7  --  88.7  PLT 249  --  527   Basic Metabolic Panel: Recent Labs  Lab 06/20/22 1813 06/20/22 1822 06/21/22 0448  NA 140 141 139  K 4.6 4.6 4.8  CL 113* 111 113*  CO2 17*  --  19*  GLUCOSE 156* 151* 93  BUN 55* 49* 52*  CREATININE 3.88* 4.20* 3.80*  CALCIUM 9.7  --  8.9   GFR: Estimated Creatinine Clearance (by C-G formula based on SCr of 3.8 mg/dL (H)) Female: 13.7 mL/min (A) Female: 16.9 mL/min (A) Liver Function Tests: Recent Labs  Lab 06/20/22 1813 06/21/22 0448  AST 15 16  ALT 10 8  ALKPHOS 110 87  BILITOT 0.8 0.5  PROT 8.9* 7.5  ALBUMIN 3.4* 2.7*   Recent Labs  Lab 06/20/22 1813  LIPASE 46   No results for input(s): "AMMONIA" in the last 168 hours. Coagulation Profile: No results for input(s): "INR", "PROTIME" in the last 168 hours. Cardiac Enzymes: No results for input(s): "CKTOTAL", "CKMB", "CKMBINDEX", "TROPONINI" in the last 168 hours. BNP (last 3 results) No results for input(s): "PROBNP" in the last 8760 hours. HbA1C: No results for input(s): "HGBA1C" in the last 72 hours. CBG: Recent Labs  Lab 06/21/22 1542 06/21/22 2048 06/22/22 0019 06/22/22 0500 06/22/22 0838  GLUCAP 107* 103* 94 71 73   Lipid Profile: No results for input(s): "CHOL", "HDL", "LDLCALC", "TRIG", "CHOLHDL", "LDLDIRECT" in the last 72 hours. Thyroid Function Tests: No results for input(s): "TSH", "T4TOTAL", "FREET4", "T3FREE", "THYROIDAB" in the last 72 hours. Anemia Panel: No results for input(s): "VITAMINB12", "FOLATE", "FERRITIN", "TIBC", "IRON", "RETICCTPCT" in the last 72 hours. Sepsis Labs: No results for input(s): "PROCALCITON", "LATICACIDVEN" in the last 168 hours.  Recent Results (from the past 240 hour(s))  Urine Culture     Status: Abnormal   Collection Time: 06/20/22  8:58 PM   Specimen: Urine, Clean Catch  Result Value Ref  Range Status   Specimen Description   Final    URINE, CLEAN CATCH Performed at [facility], [place] [address], [place], [place] [address]    Special Requests   Final    NONE Performed at [facility], [place] [address], [place], [place] [address]    Culture >=100,000 COLONIES/mL KLEBSIELLA PNEUMONIAE (A)  Final   Report Status 06/22/2022 FINAL  Final   Organism ID, Bacteria KLEBSIELLA PNEUMONIAE (A)  Final      Susceptibility   Klebsiella pneumoniae - MIC*    AMPICILLIN RESISTANT Resistant     CEFAZOLIN <=4 SENSITIVE Sensitive     CEFEPIME <=0.12 SENSITIVE Sensitive     CEFTRIAXONE <=0.25 SENSITIVE Sensitive     CIPROFLOXACIN <=0.25 SENSITIVE Sensitive     GENTAMICIN <=1 SENSITIVE Sensitive     IMIPENEM <=0.25 SENSITIVE Sensitive     NITROFURANTOIN <=16 SENSITIVE Sensitive     TRIMETH/SULFA <=20 SENSITIVE Sensitive     AMPICILLIN/SULBACTAM <=2 SENSITIVE Sensitive     PIP/TAZO <=4 SENSITIVE Sensitive     * >=100,000 COLONIES/mL KLEBSIELLA PNEUMONIAE  Radiology Studies: ECHOCARDIOGRAM COMPLETE  Result Date: 06/21/2022    ECHOCARDIOGRAM REPORT   Patient Name:   KEYRY IRACHETA Date of Exam: 06/21/2022 Medical Rec #:  [id number]         Height:       61.0 in Accession #:    [id number]        Weight:       160.0 lb Date of Birth:  11/09/1955         BSA:          1.718 m Patient Age:    68 years          BP:           126/59 mmHg Patient Gender: F                 HR:           77 bpm. Exam Location:  Inpatient Procedure: 2D Echo, Cardiac Doppler and Color Doppler Indications:    Osteomyelitis  History:        Patient has prior history of Echocardiogram examinations, most                 recent 01/07/2022. CAD, Signs/Symptoms:Shortness of Breath; Risk                 Factors:Diabetes, Hypertension and Dyslipidemia.  Sonographer:    Memory Argue Referring Phys: [place]  1. Poor acoustic windows limit study.  2. Left ventricular  ejection fraction, by estimation, is 65 to 70%. The left ventricle has normal function. The left ventricle has no regional wall motion abnormalities. There is moderate asymmetric left ventricular hypertrophy.  3. Right ventricular systolic function is normal. The right ventricular size is normal.  4. Trivial mitral valve regurgitation.  5. The aortic valve was not well visualized. Aortic valve regurgitation is not visualized. Comparison(s): The left ventricular function is unchanged. FINDINGS  Left Ventricle: Left ventricular ejection fraction, by estimation, is 65 to 70%. The left ventricle has normal function. The left ventricle has no regional wall motion abnormalities. The left ventricular internal cavity size was normal in size. There is  moderate asymmetric left ventricular hypertrophy. Right Ventricle: The right ventricular size is normal. Right vetricular wall thickness was not assessed. Right ventricular systolic function is normal. Left Atrium: Left atrial size was normal in size. Right Atrium: Right atrial size was normal in size. Pericardium: There is no evidence of pericardial effusion. Mitral Valve: There is mild thickening of the mitral valve leaflet(s). Mild mitral annular calcification. Trivial mitral valve regurgitation. Tricuspid Valve: The tricuspid valve is not well visualized. Aortic Valve: The aortic valve was not well visualized. Aortic valve regurgitation is not visualized. Aortic valve mean gradient measures 4.0 mmHg. Aortic valve peak gradient measures 6.4 mmHg. Aortic valve area, by VTI measures 2.53 cm. Pulmonic Valve: The pulmonic valve was not well visualized. Aorta: The aortic root and ascending aorta are structurally normal, with no evidence of dilitation. IAS/Shunts: The interatrial septum was not assessed.  LEFT VENTRICLE PLAX 2D LVIDd:         4.80 cm LVIDs:         3.13 cm LV PW:         1.30 cm LV IVS:        1.60 cm LVOT diam:     2.30 cm LV SV:         73 LV SV Index:   43  LVOT Area:  4.15 cm  RIGHT VENTRICLE TAPSE (M-mode): 2.2 cm LEFT ATRIUM             Index LA diam:        4.70 cm 2.74 cm/m LA Vol (A2C):   36.4 ml 21.19 ml/m LA Vol (A4C):   53.9 ml 31.38 ml/m LA Biplane Vol: 43.2 ml 25.15 ml/m  AORTIC VALVE AV Area (Vmax):    2.15 cm AV Area (Vmean):   2.09 cm AV Area (VTI):     2.53 cm AV Vmax:           126.00 cm/s AV Vmean:          98.400 cm/s AV VTI:            0.289 m AV Peak Grad:      6.4 mmHg AV Mean Grad:      4.0 mmHg LVOT Vmax:         65.30 cm/s LVOT Vmean:        49.500 cm/s LVOT VTI:          0.176 m LVOT/AV VTI ratio: 0.61  AORTA Ao Root diam: 3.40 cm MITRAL VALVE MV Area (PHT): 2.87 cm    SHUNTS MV Decel Time: 264 msec    Systemic VTI:  0.18 m MV E velocity: 67.70 cm/s  Systemic Diam: 2.30 cm MV A velocity: 89.50 cm/s MV E/A ratio:  0.76 MV A Prime:    5.9 cm/s Dorris Carnes MD Electronically signed by Dorris Carnes MD Signature Date/Time: 06/21/2022/12:10:32 PM    Final    MR Lumbar Spine W Wo Contrast  Result Date: 06/21/2022 CLINICAL DATA:  Low back pain, infection suspected, positive xray/CT EXAM: MRI LUMBAR SPINE WITHOUT AND WITH CONTRAST TECHNIQUE: Multiplanar and multiecho pulse sequences of the lumbar spine were obtained without and with intravenous contrast. CONTRAST:  7.48m GADAVIST GADOBUTROL 1 MMOL/ML IV SOLN COMPARISON:  MRI of the lumbar spine 05/13/2022. FINDINGS: Segmentation: Standard segmentation is assumed. Same numbering as on prior. Alignment:  No substantial sagittal subluxation. Vertebrae: Prior T11 through L3 posterior fusion. Incompletely imaged edema within the T11 vertebral body and surrounding paraspinous musculature, which remains concerning for discitis/osteomyelitis. This may be mildly improved relative to 05/13/2022 but is incompletely characterized (the T10 vertebral body and T10-T11 disc space are not imaged and there is poor visualization of the canal at this level). Conus medullaris and cauda equina: Conus and cauda  equina are partially obscured by metallic artifact. Paraspinal and other soft tissues: Incompletely imaged paraspinous edema in the lower thoracic spine. The extent of edema is probably improved from the prior. Disc levels: Unchanged multilevel superimposed degenerative change, as characterized on prior MRI. IMPRESSION: Incompletely imaged edema within the T11 vertebral body and surrounding paraspinous musculature, which remains concerning for discitis/osteomyelitis. This may be mildly improved relative to 05/13/2022 but is incompletely characterized (the T10 vertebral body and T10-T11 disc space are not imaged and there is poor visualization of the canal at these levels). Recommend MRI of the thoracic spine with contrast for further characterization. Electronically Signed   By: FMargaretha SheffieldM.D.   On: 06/21/2022 12:04   CT ABDOMEN PELVIS WO CONTRAST  Result Date: 06/20/2022 CLINICAL DATA:  Acute abdominal pain.  Nausea and vomiting. EXAM: CT ABDOMEN AND PELVIS WITHOUT CONTRAST TECHNIQUE: Multidetector CT imaging of the abdomen and pelvis was performed following the standard protocol without IV contrast. RADIATION DOSE REDUCTION: This exam was performed according to the departmental dose-optimization program which includes automated exposure control,  adjustment of the mA and/or kV according to patient size and/or use of iterative reconstruction technique. COMPARISON:  Most recent CT 03/25/2022.  MRI 05/13/2022 FINDINGS: Lower chest: No acute airspace disease, no pleural effusion. Hepatobiliary: Calcified granuloma in the liver. Nodular hepatic contours. No evidence of focal liver lesion on this unenhanced exam. Clips in the gallbladder fossa postcholecystectomy. No biliary dilatation. Pancreas: No ductal dilatation or inflammation. Spleen: Upper normal in size spanning 12.2 x 5.5 x 12.7 cm (volume = 450 cm^3). Calcified granuloma. Adrenals/Urinary Tract: No adrenal nodule. Chronic perinephric edema. No  hydronephrosis. Calcification in the upper right kidney may be nonobstructing stone or vascular. No evidence of focal renal abnormality on this unenhanced exam. Unremarkable urinary bladder. Stomach/Bowel: Decompressed stomach. There is a small duodenal diverticulum. No bowel obstruction or inflammation. Normal appendix. Small to moderate colonic stool burden. There is sigmoid colonic redundancy. Vascular/Lymphatic: Moderate aortic atherosclerosis. No aortic aneurysm. Smooth aortic contours. Small retroperitoneal lymph nodes, not enlarged by size criteria. Reproductive: Hysterectomy.  No adnexal mass. Other: No ascites or free air. Small fat containing umbilical hernia. Mild rectus diastasis. Musculoskeletal: T10-T11 disc space irregularity, lucency, and paravertebral soft tissue thickening. There is paravertebral soft tissue edema. The superior pedicle screws extend into the region of disc abnormality. This is at site of prior MRI findings consistent with discitis osteomyelitis. Prior L1 corpectomy. IMPRESSION: 1. No acute abnormality in the abdomen/pelvis. 2. T10-T11 disc space irregularity, lucency, and paravertebral soft tissue thickening. 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 This may represent persistent or residual infection, although the degree of soft tissue edema is suspicious for on going infection. The superior pedicle screws extend into the region of disc abnormality. 3. Cirrhotic hepatic morphology.  Borderline splenomegaly. 4. Nonobstructing right renal stone versus vascular calcification. Aortic Atherosclerosis (ICD10-I70.0). Electronically Signed   By: Keith Rake M.D.   On: 06/20/2022 17:49        Scheduled Meds:  carvedilol  6.25 mg Oral Daily   DULoxetine  60 mg Oral Daily   insulin aspart  0-9 Units Subcutaneous Q4H   insulin glargine-yfgn  18 Units Subcutaneous QHS   mometasone-formoterol  2 puff Inhalation BID   montelukast  10 mg Oral QHS   mupirocin  cream   Topical Daily   pregabalin  75 mg Oral TID   Continuous Infusions:  sodium chloride     cefTRIAXone (ROCEPHIN)  IV Stopped (06/21/22 2213)     LOS: 2 days    Time spent: 35 minutes    Barb Merino, MD [facility] Pager [phone]

## 2022-06-22 NOTE — Telephone Encounter (Signed)
Spoke with [name] [name]. [name] [name] states they received the original order but order had two different dates. The order date and the date Lowne signed the order. She states per insurance the order can only have one date. She will resend the order for sig. Lowne made aware verbally.

## 2022-06-22 NOTE — Progress Notes (Signed)
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  Her back pain is usually chronic but worsened.  She has been following with neurosurgery as outpatient, she has extensive vertebral damages that have been under investigation. 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 Latest MRI T spine revealed:   Early discitis and osteomyelitis at T4-5. No epidural abscess or stenosis. Right paraspinous soft tissue enhancement due to infection.   Discitis and osteomyelitis at T10-11. There is enhancement at T9 on the left which likely is also infection. There is endplate erosion at  [id number] due to infection. There is diffuse epidural thickening and enhancement from T8 through T11 compatible with infection. No epidural abscess. There is moderate spinal stenosis at T10-11. Compared with the prior MRI of the lumbar spine 05/13/2021, there appears to be progressive endplate destruction and progressive fluid in the disc space at T10-11  She is afebrile, WBC nl, hgb 9.5, plts nl, PT/INR nl; creat 3.8. ;  request now received for image guided aspiration of T10-11 disc space. Imaging studies were reviewed by Dr. Vernard Gambles. Risks and benefits of procedure was discussed with the patient /spouse including, but not limited to bleeding, infection, damage to adjacent structures or low yield requiring additional tests.  All of the questions were answered and there is agreement to proceed.  Consent signed and in chart.Procedure scheduled for today.     Electronically Signed: D. Rowe Robert, PA-C 06/22/2022, 10:07 PM   I spent a total of  25 minutes at the the patient's bedside AND on the patient's hospital floor or unit, greater than 50% of which was counseling/coordinating care for image guided thoracic 10-11 disc space aspiration    Patient ID: [name], adult   DOB: 12-10-1955, 66 y.o.   MRN: [id number]

## 2022-06-23 ENCOUNTER — Inpatient Hospital Stay (HOSPITAL_COMMUNITY): Payer: PRIVATE HEALTH INSURANCE

## 2022-06-23 ENCOUNTER — Encounter (HOSPITAL_COMMUNITY): Payer: Self-pay | Admitting: Internal Medicine

## 2022-06-23 DIAGNOSIS — N184 Chronic kidney disease, stage 4 (severe): Secondary | ICD-10-CM

## 2022-06-23 DIAGNOSIS — M869 Osteomyelitis, unspecified: Secondary | ICD-10-CM | POA: Diagnosis not present

## 2022-06-23 DIAGNOSIS — B961 Klebsiella pneumoniae [K. pneumoniae] as the cause of diseases classified elsewhere: Secondary | ICD-10-CM | POA: Diagnosis not present

## 2022-06-23 DIAGNOSIS — M4626 Osteomyelitis of vertebra, lumbar region: Secondary | ICD-10-CM | POA: Diagnosis not present

## 2022-06-23 DIAGNOSIS — N390 Urinary tract infection, site not specified: Secondary | ICD-10-CM

## 2022-06-23 DIAGNOSIS — E119 Type 2 diabetes mellitus without complications: Secondary | ICD-10-CM

## 2022-06-23 HISTORY — PX: IR LUMBAR DISC ASPIRATION W/IMG GUIDE: IMG5306

## 2022-06-23 LAB — COMPREHENSIVE METABOLIC PANEL
ALT: 10 U/L (ref 0–44)
AST: 15 U/L (ref 15–41)
Albumin: 2.7 g/dL — ABNORMAL LOW (ref 3.5–5.0)
Alkaline Phosphatase: 81 U/L (ref 38–126)
Anion gap: 8 (ref 5–15)
BUN: 67 mg/dL — ABNORMAL HIGH (ref 8–23)
CO2: 17 mmol/L — ABNORMAL LOW (ref 22–32)
Calcium: 8.1 mg/dL — ABNORMAL LOW (ref 8.9–10.3)
Chloride: 116 mmol/L — ABNORMAL HIGH (ref 98–111)
Creatinine, Ser: 4.89 mg/dL — ABNORMAL HIGH (ref 0.44–1.00)
GFR, Estimated: 9 mL/min — ABNORMAL LOW (ref >60)
Glucose, Bld: 95 mg/dL (ref 70–99)
Potassium: 4.6 mmol/L (ref 3.5–5.1)
Sodium: 141 mmol/L (ref 135–145)
Total Bilirubin: 0.4 mg/dL (ref 0.3–1.2)
Total Protein: 6.9 g/dL (ref 6.5–8.1)

## 2022-06-23 LAB — GLUCOSE, CAPILLARY
Glucose-Capillary: 111 mg/dL — ABNORMAL HIGH (ref 70–99)
Glucose-Capillary: 113 mg/dL — ABNORMAL HIGH (ref 70–99)
Glucose-Capillary: 137 mg/dL — ABNORMAL HIGH (ref 70–99)
Glucose-Capillary: 142 mg/dL — ABNORMAL HIGH (ref 70–99)
Glucose-Capillary: 150 mg/dL — ABNORMAL HIGH (ref 70–99)
Glucose-Capillary: 98 mg/dL (ref 70–99)

## 2022-06-23 LAB — CBC
HCT: 28.7 % — ABNORMAL LOW (ref 36.0–46.0)
Hemoglobin: 8.7 g/dL — ABNORMAL LOW (ref 12.0–15.0)
MCH: 27.5 pg (ref 26.0–34.0)
MCHC: 30.3 g/dL (ref 30.0–36.0)
MCV: 90.8 fL (ref 80.0–100.0)
Platelets: 171 10*3/uL (ref 150–400)
RBC: 3.16 MIL/uL — ABNORMAL LOW (ref 3.87–5.11)
RDW: 15.9 % — ABNORMAL HIGH (ref 11.5–15.5)
WBC: 6.5 10*3/uL (ref 4.0–10.5)
nRBC: 0 % (ref 0.0–0.2)

## 2022-06-23 LAB — APTT: aPTT: 29 seconds (ref 24–36)

## 2022-06-23 LAB — HEPARIN LEVEL (UNFRACTIONATED): Heparin Unfractionated: <0.1 IU/mL — ABNORMAL LOW (ref 0.30–0.70)

## 2022-06-23 LAB — SODIUM, URINE, RANDOM: Sodium, Ur: 51 mmol/L

## 2022-06-23 LAB — CREATININE, URINE, RANDOM: Creatinine, Urine: 120 mg/dL

## 2022-06-23 LAB — PROTIME-INR
INR: 1.2 (ref 0.8–1.2)
Prothrombin Time: 14.8 seconds (ref 11.4–15.2)

## 2022-06-23 MED ORDER — HEPARIN BOLUS VIA INFUSION
1500.0000 [IU] | Freq: Once | INTRAVENOUS | Status: AC
Start: 2022-06-23 — End: 2022-06-23
  Administered 2022-06-23: 1500 [IU] via INTRAVENOUS
  Filled 2022-06-23: qty 1500

## 2022-06-23 MED ORDER — FENTANYL CITRATE (PF) 100 MCG/2ML IJ SOLN
INTRAMUSCULAR | Status: AC
  Filled 2022-06-23: qty 2

## 2022-06-23 MED ORDER — ONDANSETRON HCL 4 MG/2ML IJ SOLN
INTRAMUSCULAR | Status: AC | PRN
  Administered 2022-06-23: 4 mg via INTRAVENOUS

## 2022-06-23 MED ORDER — LIDOCAINE HCL (PF) 1 % IJ SOLN
INTRAMUSCULAR | Status: AC
  Filled 2022-06-23: qty 30

## 2022-06-23 MED ORDER — SODIUM CHLORIDE 0.9 % IV SOLN
8.0000 mg/kg | Freq: Every day | INTRAVENOUS | Status: DC
  Filled 2022-06-23: qty 10

## 2022-06-23 MED ORDER — FENTANYL CITRATE (PF) 100 MCG/2ML IJ SOLN
INTRAMUSCULAR | Status: AC | PRN
  Administered 2022-06-23 (×2): 50 ug via INTRAVENOUS

## 2022-06-23 MED ORDER — HEPARIN (PORCINE) 25000 UT/250ML-% IV SOLN
1850.0000 [IU]/h | INTRAVENOUS | Status: AC
  Administered 2022-06-23: 1000 [IU]/h via INTRAVENOUS
  Administered 2022-06-24: 1250 [IU]/h via INTRAVENOUS
  Administered 2022-06-26 – 2022-06-27 (×2): 1450 [IU]/h via INTRAVENOUS
  Administered 2022-06-27: 1850 [IU]/h via INTRAVENOUS
  Filled 2022-06-23 (×6): qty 250

## 2022-06-23 MED ORDER — MIDAZOLAM HCL 2 MG/2ML IJ SOLN
INTRAMUSCULAR | Status: AC
  Filled 2022-06-23: qty 4

## 2022-06-23 MED ORDER — SODIUM CHLORIDE 0.9 % IV SOLN
8.0000 mg/kg | INTRAVENOUS | Status: DC
  Administered 2022-06-23 – 2022-07-03 (×6): 500 mg via INTRAVENOUS
  Filled 2022-06-23 (×7): qty 10

## 2022-06-23 MED ORDER — LIDOCAINE HCL (PF) 1 % IJ SOLN
INTRAMUSCULAR | Status: AC | PRN
  Administered 2022-06-23: 5 mL

## 2022-06-23 MED ORDER — MIDAZOLAM HCL 2 MG/2ML IJ SOLN
INTRAMUSCULAR | Status: AC | PRN
  Administered 2022-06-23 (×2): 1 mg via INTRAVENOUS

## 2022-06-23 MED ORDER — SODIUM CHLORIDE 0.9 % IV BOLUS
2000.0000 mL | Freq: Once | INTRAVENOUS | Status: AC
  Administered 2022-06-23: 2000 mL via INTRAVENOUS

## 2022-06-23 MED ORDER — CHLORHEXIDINE GLUCONATE CLOTH 2 % EX PADS
6.0000 | MEDICATED_PAD | Freq: Every day | CUTANEOUS | Status: DC
  Administered 2022-06-24 – 2022-07-11 (×18): 6 via TOPICAL

## 2022-06-23 MED ORDER — ONDANSETRON HCL 4 MG/2ML IJ SOLN
INTRAMUSCULAR | Status: AC
  Filled 2022-06-23: qty 2

## 2022-06-23 MED ORDER — GLUCERNA SHAKE PO LIQD
237.0000 mL | Freq: Three times a day (TID) | ORAL | Status: DC
  Administered 2022-06-23 – 2022-07-11 (×24): 237 mL via ORAL
  Filled 2022-06-23 (×13): qty 237

## 2022-06-23 NOTE — Progress Notes (Addendum)
ANTICOAGULATION CONSULT NOTE - Initial Consult  Pharmacy Consult for Heparin Indication: DVT/PE, apixaban held  Allergies  Allergen Reactions   Bee Pollen Anaphylaxis   Broccoli [Brassica Oleracea] Anaphylaxis and Hives   Fish-Derived Products Anaphylaxis and Hives   Mushroom Extract Complex Anaphylaxis   Penicillins Anaphylaxis    **Tolerated cefepime March 2021 Did it involve swelling of the face/tongue/throat, SOB, or low BP? Yes Did it involve sudden or severe rash/hives, skin peeling, or any reaction on the inside of your mouth or nose? No Did you need to seek medical attention at a hospital or doctor's office? Yes When did it last happen?  A few months ago If all above answers are "NO", may proceed with cephalosporin use.   Rosemary Oil Anaphylaxis   Shellfish Allergy Anaphylaxis and Hives   Tomato Anaphylaxis and Hives    Raw tomatoes only, can tolerate if cooked   Acetaminophen Other (See Comments)    GI upset   Aloe Vera Hives   Acyclovir And Related Other (See Comments)    Unknown reaction   Naproxen Hives    Patient Measurements: Height: 5' 1"  (154.9 cm) Weight: 77.3 kg (170 lb 6.7 oz) IBW/kg (Calculated) : 47.8 Heparin Dosing Weight: 65 kg  Vital Signs: Temp: 97.5 F (36.4 C) (08/25 1309) Temp Source: Oral (08/25 1309) BP: 112/53 (08/25 1309) Pulse Rate: 67 (08/25 1309)  Labs: Recent Labs    06/20/22 1813 06/20/22 1822 06/21/22 0448 06/23/22 0520 06/23/22 0620  HGB 11.1* 11.9* 9.5*  --  8.7*  HCT 35.7* 35.0* 31.3*  --  28.7*  PLT 249  --  221  --  171  LABPROT  --   --   --  14.8  --   INR  --   --   --  1.2  --   CREATININE 3.88* 4.20* 3.80*  --  4.89*    Estimated Creatinine Clearance (by C-G formula based on SCr of 4.89 mg/dL (H)) Female: 10.6 mL/min (A) Female: 13.1 mL/min (A)   Medical History: Past Medical History:  Diagnosis Date   Acute MI ([place]) 2007   presented to ED & had cardiac cath- but found to have normal coronaries. Since  that point in time her PCP cares f or cardiac needs. Dr. [name] - [facility] [place]   Anemia    Anginal pain [facility])    Anxiety    Asthma    Back pain 11/17/2019   Bulging lumbar disc    CAD (coronary artery disease) 01/11/2012   Cataract    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 ([place]) 03/16/2020   Chronic kidney disease    "had transplant when I was 52; doesn't bother me now" (03/20/2013)   Cirrhosis of liver without mention of alcohol    Constipation    Dehiscence of closure of skin    left partial calcaneal excision   Depression    Diabetes mellitus    insulin dependent, adult onset   Diarrhea 09/04/2019   Episode of visual loss of left eye    Essential hypertension 12/23/2006   Qualifier: Diagnosis of  By: Larose Kells MD, Alda Berthold.    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   Retinopathy 04/17/2019   Sleep apnea 11/16/2017   SOB (shortness of breath) 10/11/2020   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   Qualifier: Diagnosis of  By: Charlott Holler CMA, [place]     Type 2 diabetes mellitus with hyperglycemia, with long-term current use of insulin ([place]) 04/06/2017    Medications:  Scheduled:   carvedilol  6.25 mg Oral Daily   DULoxetine  60 mg Oral Daily   feeding supplement (GLUCERNA SHAKE)  237 mL Oral TID BM   fentaNYL       insulin aspart  0-9 Units Subcutaneous Q4H   insulin glargine-yfgn  8 Units Subcutaneous QHS   lidocaine (PF)       midazolam       mometasone-formoterol  2  puff Inhalation BID   montelukast  10 mg Oral QHS   mupirocin cream   Topical Daily   ondansetron       pregabalin  75 mg Oral TID   Infusions:   sodium chloride Stopped (06/22/22 1808)   cefTRIAXone (ROCEPHIN)  IV Stopped (06/22/22 2137)   DAPTOmycin (CUBICIN) 500 mg in sodium chloride 0.9 % IVPB     sodium chloride      Assessment: 24 yoF admitted on 8/22 with possible Discitis/OM, s/p aspiration on 8/25.  PMH is significant for chronic DVT and history of PE on Eliquis prior to admission.  Pharmacy is consulted to dose Heparin while Eliquis is held for possible surgical procedures next week.    PTA Eliquis 2.5 BID - pt reports last dose taken on 8/19 (stopped in anticipation of procedure).  Note patient only fills every 2 months for 30 day supply.    Today, 06/23/2022:  Baseline coags: ordered CBC: Hgb 8.7 (near baseline), Plt WNL Renal function:  AoCKD with SCr increased to 4.89 (baseline 2.5-3, s/p renal transplant 2014)   Goal of Therapy:  Heparin level 0.3-0.7 units/ml aPTT 66-102 seconds Monitor platelets by anticoagulation protocol: Yes   Plan:  Give heparin 1500 units bolus IV x 1 Start heparin IV infusion at 1000 units/hr Heparin level 8 hours after starting Daily heparin level and CBC   Gretta Arab PharmD, BCPS Clinical Pharmacist [facility] main pharmacy [phone] 06/23/2022 4:22 PM
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
## 2022-06-23 NOTE — Progress Notes (Signed)
Initial Nutrition Assessment  INTERVENTION:   -Glucerna Shake po TID, each supplement provides 220 kcal and 10 grams of protein   NUTRITION DIAGNOSIS:   Increased nutrient needs related to acute illness as evidenced by estimated needs.  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  Patient unavailable at time of visit, having IR procedure. Per chart review, pt was having N/V and abdominal pain for 3 days PTA. Pt consumed 100% of dinner last night. Was NPO today for procedure. Now on heart healthy/CHO modified.  Will order Glucerna shakes for additional kcals and protein.  Per weight records, pt has lost 36 lbs since December 2022 (17% wt loss x 8 months, significant for time frame).  Medications: IV Zofran  Labs reviewed: CBGs: 111-150   NUTRITION - FOCUSED PHYSICAL EXAM:  Unable to complete, pt at procedure  Diet Order:   Diet Order             Diet heart healthy/carb modified Room service appropriate? Yes; Fluid consistency: Thin  Diet effective now                   EDUCATION NEEDS:   No education needs have been identified at this time  Skin:  Skin Assessment: Skin Integrity Issues: Skin Integrity Issues:: Diabetic Ulcer, Incisions Diabetic Ulcer: left heel Incisions: 8/23 -open sternum  Last BM:  8/21  Height:   Ht Readings from Last 1 Encounters:  06/21/22 5' 1"  (1.549 m)    Weight:    Wt Readings from Last 1 Encounters:  06/22/22 77.3 kg    BMI:  Body mass index is 32.2 kg/m.  Estimated Nutritional Needs:   Kcal:  1450-1650  Protein:  70-85g  Fluid:  1.6L/day  Clayton Bibles, MS, RD, LDN Inpatient Clinical Dietitian Contact information available via Amion
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
## 2022-06-23 NOTE — Progress Notes (Signed)
Patient transported via bed to IR by patient transporter. Spouse [name] Laurance Flatten notified patient is off unit.

## 2022-06-23 NOTE — Procedures (Signed)
  Procedure:  FLuoro guided aspiration T10-11 disc 80m serosanguinous, for GS, C&S Preprocedure diagnosis: The primary encounter diagnosis was Other acute osteomyelitis, other site ([facility]. A diagnosis of Acute cystitis without hematuria was also pertinent to this visit.  Postprocedure diagnosis: same EBL:    minimal Complications:   none immediate  See full dictation in [facility]J's  DDillard CannonMD Main # [phone]  [phone] [phone]

## 2022-06-23 NOTE — Progress Notes (Signed)
Patient transported via bed to unit 5E room 18 from IR by clinical staff member. Patient is resting with eyes closed. Respirations even and unlabored. No signs of acute distress noted. Spouse [name] Laurance Flatten notified patient has returned to unit.

## 2022-06-23 NOTE — Progress Notes (Addendum)
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  Back pain is usually chronic but worsened.  She has been following with neurosurgery as outpatient, she has extensive vertebral damages that has been under investigation.  Work-up is revealed negative abdominal pathology on CT however she does have T10-11 disc space infection.   Subjective:  Patient's main concern is ongoing abdominal distress and discomfort.  She has nausea, spitting up some and has no appetite.  Notes abdominal distress is continuous, not episodic or colicky.   Objective: Vitals:   06/23/22 1240 06/23/22 1245 06/23/22 1250 06/23/22 1309  BP: 129/62 118/64 102/67 (!) 112/53  Pulse: 67 65 65 67  Resp: 19 11 10 16   Temp:    (!) 97.5 F (36.4 C)  TempSrc:    Oral  SpO2: 96% 94% 94% 93%  Weight:      Height:        Intake/Output Summary (Last 24 hours) at 06/23/2022 1346 Last data filed at 06/23/2022 0846 Gross per 24 hour  Intake 834.81 ml  Output 1075 ml  Net -240.19 ml   Filed Weights   06/21/22 0800 06/21/22 1053 06/22/22 0500  Weight: 76.7 kg 76.7 kg 77.3 kg     Exam:  General: Patient lying in bed appearing uncomfortable with her hands on her abdomen. Eyes: sclera anicteric, conjuctiva mild injection bilaterally CVS: S1-S2, regular  Respiratory:  decreased air entry bilaterally secondary to decreased inspiratory effort, rales at bases  GI: Abdomen is obese, not tympanitic, not suggestive of distention,  soft to firm but not hard, minimal diffuse tenderness without rebound.  No epigastric tenderness.  No RUQ tenderness.  Perhaps mild suprapubic or bilateral LQ tenderness, but not reproducible LE: No edema.  Neuro: A/O x 3, Moving all extremities equally with normal strength, CN 3-12 intact, grossly nonfocal.  Psych: patient is logical and coherent, judgement and insight appear normal, mood and affect appropriate to situation.   Assessment & Plan:   Abdominal pain Unclear cause of abdominal pain CT abdomen/pelvis 06/20/2022 shows no acute GI/GU abnormality Patient does have UTI and does appear to have some suprapubic/lower quadrant tenderness, certainly that is where her pain is primarily. Patient also has GFR of 11 although BUN is only 50 so doubt uremia but it is on the differential. Leukocytosis is improving on daptomycin, ceftriaxone restarted after biopsy. Patient denies constipation.  Acute on CKD 5 GFR is 11 Patient is apparently 50 years s/p renal transplant Is not immunocompromised/not on any immune suppressants Will request nephrology consultation to rule out renal causes of N/V/abdominal pain  T10-T11 discitis/vertebral OM/H/O Pseudomonas OM Patient underwent IR T10/11 disc space aspiration/biopsy Patient being followed by ID and neurosurgery Will discuss need for possible surgery with Dr. Marcello Moores today  Chronic DVT Eliquis is being held since 06/18/22--discussed with neurosurgery, patient is unlikely to go to surgery this weekend.  Can start prophylactic anticoagulation again.  However given GFR of 11, Eliquis is no longer indicated.  Will put in pharmacy consult for heparin for treatment of chronic DVT pending  further decision regarding need for surgery while in house. Continue SCD until heparin is therapeutic.  DM 2 Blood sugars under excellent control on present regimen  HFpEF Appears euvolemic at present Lasix is being held   DVT prophylaxis: SCDs, low threshold for  restarting Eliquis once need for surgery has been ruled out Code Status: Full Family Communication: None   Scheduled Meds:  carvedilol  6.25 mg Oral Daily   DULoxetine  60 mg Oral Daily   fentaNYL       insulin aspart  0-9 Units Subcutaneous Q4H   insulin glargine-yfgn  8 Units Subcutaneous QHS   lidocaine (PF)       midazolam       mometasone-formoterol  2 puff Inhalation BID   montelukast  10 mg Oral QHS   mupirocin cream   Topical Daily   ondansetron       pregabalin  75 mg Oral TID   Continuous Infusions:  sodium chloride Stopped (06/22/22 1808)   cefTRIAXone (ROCEPHIN)  IV Stopped (06/22/22 2137)   DAPTOmycin (CUBICIN) 500 mg in sodium chloride 0.9 % IVPB      Data Reviewed:  Basic Metabolic Panel: Recent Labs  Lab 06/20/22 1813 06/20/22 1822 06/21/22 0448 06/23/22 0620  NA 140 141 139 141  K 4.6 4.6 4.8 4.6  CL 113* 111 113* 116*  CO2 17*  --  19* 17*  GLUCOSE 156* 151* 93 95  BUN 55* 49* 52* 67*  CREATININE 3.88* 4.20* 3.80* 4.89*  CALCIUM 9.7  --  8.9 8.1*    CBC: Recent Labs  Lab 06/20/22 1813 06/20/22 1822 06/21/22 0448 06/23/22 0620  WBC 10.9*  --  9.9 6.5  NEUTROABS 9.4*  --  7.4  --   HGB 11.1* 11.9* 9.5* 8.7*  HCT 35.7* 35.0* 31.3* 28.7*  MCV 86.7  --  88.7 90.8  PLT 249  --  221 171    Studies: MR THORACIC SPINE W WO CONTRAST  Result Date: 06/22/2022 CLINICAL DATA:  Suspect thoracic vertebral osteomyelitis. EXAM: MRI THORACIC WITHOUT AND WITH CONTRAST TECHNIQUE: Multiplanar and multiecho pulse sequences of the thoracic spine were obtained without and with intravenous contrast. CONTRAST:  7.53m GADAVIST GADOBUTROL 1 MMOL/ML IV SOLN COMPARISON:  MRI thoracic spine 02/12/2020. Lumbar MRI 05/13/2022 and 06/21/2022 FINDINGS: Alignment:  Normal Vertebrae: Abnormal bone marrow edema and enhancement at T4 and T5. Intervening disc space is maintained without significant disc space narrowing or hyperintensity. This is most likely discitis and  osteomyelitis. There is paraspinous soft tissue thickening and enhancement on the right side also supportive of infection. No epidural abscess. Abnormal bone marrow edema and enhancement T10 and T11 vertebral bodies. There is prominent hyperintensity in the disc space at T10-11 compatible with discitis. There is erosion of the endplates at TP10-25there is bilateral paraspinous soft tissue thickening and enhancement also supportive of infection. There is epidural thickening and enhancement centered at T10-11 but extending superiorly to T8 compatible with phlegmon and infection. No epidural abscess. No paraspinous soft tissue abscess. There is mild bone marrow edema and enhancement at T9 on the left which likely is also infection. Cord: Moderate spinal stenosis with cord deformity at T10-11. Possible mild cord edema at this level. Paraspinal and other soft tissues: Paraspinous soft tissue thickening enhancement on the right at T3-4. Bilateral paraspinous soft tissue thickening enhancement T10-11 compatible with infection. Disc levels: Osteomyelitis at T4-5.  No significant stenosis. Small central disc protrusion T5-6 without stenosis. Moderate spinal stenosis at T10-11 due to  discitis and osteomyelitis with epidural soft tissue thickening as well as degenerative change. Pedicle screw fusion posteriorly beginning at T11 and extending into the lumbar spine. The T11 screws appear displaced into the T10-11 disc space. IMPRESSION: Early discitis and osteomyelitis at T4-5. No epidural abscess or stenosis. Right paraspinous soft tissue enhancement due to infection. Discitis and osteomyelitis at T10-11. There is enhancement at T9 on the left which likely is also infection. There is endplate erosion at D63-87 due to infection. There is diffuse epidural thickening and enhancement from T8 through T11 compatible with infection. No epidural abscess. There is moderate spinal stenosis at T10-11. Compared with the prior MRI of the  lumbar spine 05/13/2021, there appears to be progressive endplate destruction and progressive fluid in the disc space at T10-11. Electronically Signed   By: Franchot Gallo M.D.   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

## 2022-06-23 NOTE — Progress Notes (Signed)
Patient has not Voided in 11 hrs. Last in and out catheter output was 1150 Mls on 06/22/22 at 1846. Recently Bladder scanned patient with a result of 182 Mls. Will continue to monitor patient.

## 2022-06-23 NOTE — Progress Notes (Signed)
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 - Noted on MRI in July 2023, obtained by neurosurgeon with possible findings of discitis/osteomyelitis and hardware failure.  Infectious versus positive for telemetry changes.  Inflammatory markers were not elevated and difficult to interpret in the setting of AKI, UTI, foot ulcer.  IR guided disc aspiration for further information. - MRI T-spine ordered showing discitis/osteomyelitis T10-11.  Diffuse epidural thickening and has a T8-T11 pedicle infection.  Compared to MRI on 05/13/2021 there appears to be progressive endplate destruction and progressive fluid displaced T10-11. #UTI with klleb pneumo - Patient continues to have symptomatic UTI with dysuria and abdominal tenderness -CT abdomen pelvis on admission showed no evidence of focal renal abnormalities, or urinary bladder. #CKD stage IV - Continue to monitor creatinine #Type 2 diabetes  Recommendations: --Continue ceftriaxone -Start daptomycin(avoid nephrotoxicity with vancomycin) - Follow-aspirate cultures.  Ceftriaxone and daptomycin should cover empiric regimen for spinal infection. - I am concerned about patient's abdominal tenderness.  CT done on 8/22 was without contrast to given her worsening renal function will avoid contrast.  Patient noted to have a renal ultrasound ordered.  If that is benign will order repeat  CT. - Plan to follow-up with Dr. Megan Salon following discharge.  Microbiology:   Antibiotics: CTX   Urine Kleb pneumo   SUBJECTIVE: Resting in bed.  Continues to reports dysuria with suprapubic pain Interval: Afebrile overnight.  Review of Systems: Review of Systems  All other systems reviewed and are negative.    Scheduled Meds:  carvedilol  6.25 mg Oral Daily   DULoxetine  60 mg Oral Daily   feeding supplement (GLUCERNA SHAKE)  237 mL Oral TID BM   fentaNYL       insulin aspart  0-9 Units Subcutaneous Q4H   insulin glargine-yfgn  8 Units Subcutaneous QHS   lidocaine (PF)       midazolam       mometasone-formoterol  2 puff Inhalation BID   montelukast  10 mg Oral QHS   mupirocin cream   Topical Daily   ondansetron       pregabalin  75 mg Oral TID   Continuous Infusions:  sodium chloride Stopped (06/22/22 1808)   cefTRIAXone (ROCEPHIN)  IV Stopped (06/22/22 2137)   DAPTOmycin (CUBICIN) 500 mg in sodium chloride 0.9 % IVPB     sodium chloride     PRN Meds:.albuterol, diazepam, fentaNYL, lidocaine (PF), methocarbamol, midazolam, naLOXone (NARCAN)  injection, ondansetron, ondansetron (ZOFRAN) IV, mouth rinse, oxyCODONE Allergies  Allergen Reactions   Bee Pollen Anaphylaxis   Broccoli [Brassica Oleracea] Anaphylaxis and Hives   Fish-Derived Products Anaphylaxis and Hives   Mushroom Extract Complex Anaphylaxis   Penicillins Anaphylaxis    **Tolerated cefepime March 2021 Did it involve swelling of the face/tongue/throat, SOB, or low BP? Yes Did it involve sudden or severe rash/hives, skin peeling, or any reaction on  the inside of your mouth or nose? No Did you need to seek medical attention at a hospital or doctor's office? Yes When did it last happen?  A few months ago If all above answers are "NO", may proceed with cephalosporin use.   Rosemary Oil Anaphylaxis   Shellfish Allergy Anaphylaxis and Hives   Tomato Anaphylaxis and Hives    Raw tomatoes only, can  tolerate if cooked   Acetaminophen Other (See Comments)    GI upset   Aloe Vera Hives   Acyclovir And Related Other (See Comments)    Unknown reaction   Naproxen Hives    OBJECTIVE: Vitals:   06/23/22 1240 06/23/22 1245 06/23/22 1250 06/23/22 1309  BP: 129/62 118/64 102/67 (!) 112/53  Pulse: 67 65 65 67  Resp: 19 11 10 16   Temp:    (!) 97.5 F (36.4 C)  TempSrc:    Oral  SpO2: 96% 94% 94% 93%  Weight:      Height:       Body mass index is 32.2 kg/m.  Physical Exam Constitutional:      Appearance: Normal appearance.  HENT:     Head: Normocephalic and atraumatic.     Right Ear: Tympanic membrane normal.     Left Ear: Tympanic membrane normal.     Nose: Nose normal.     Mouth/Throat:     Mouth: Mucous membranes are moist.  Eyes:     Extraocular Movements: Extraocular movements intact.     Conjunctiva/sclera: Conjunctivae normal.     Pupils: Pupils are equal, round, and reactive to light.  Cardiovascular:     Rate and Rhythm: Normal rate and regular rhythm.     Heart sounds: No murmur heard.    No friction rub. No gallop.  Pulmonary:     Effort: Pulmonary effort is normal.     Breath sounds: Normal breath sounds.  Abdominal:     General: Abdomen is flat.     Palpations: Abdomen is soft.  Musculoskeletal:        General: Normal range of motion.  Skin:    General: Skin is warm and dry.  Neurological:     General: No focal deficit present.     Mental Status: She is alert and oriented to person, place, and time.  Psychiatric:        Mood and Affect: Mood normal.       Lab Results Lab Results  Component Value Date   WBC 6.5 06/23/2022   HGB 8.7 (L) 06/23/2022   HCT 28.7 (L) 06/23/2022   MCV 90.8 06/23/2022   PLT 171 06/23/2022    Lab Results  Component Value Date   CREATININE 4.89 (H) 06/23/2022   BUN 67 (H) 06/23/2022   NA 141 06/23/2022   K 4.6 06/23/2022   CL 116 (H) 06/23/2022   CO2 17 (L) 06/23/2022    Lab Results  Component Value Date    ALT 10 06/23/2022   AST 15 06/23/2022   ALKPHOS 81 06/23/2022   BILITOT 0.4 06/23/2022        Laurice Record, [facility] 06/23/2022, 2:45 PM

## 2022-06-23 NOTE — Plan of Care (Signed)

## 2022-06-23 NOTE — Telephone Encounter (Signed)
Called pt was unable to lvm, try again later.

## 2022-06-23 NOTE — Progress Notes (Signed)
  Transition of Care [facility]) Screening Note   Patient Details  Name: Markeda Terrence Pizana Date of Birth: [birth year]   Transition of Care [facility]) CM/SW Contact:    Vassie Moselle, LCSW Phone Number: 06/23/2022, 2:13 PM    Transition of Care Department [facility]) has reviewed patient and no TOC needs have been identified at this time. We will continue to monitor patient advancement through interdisciplinary progression rounds. If new patient transition needs arise, please place a TOC consult.

## 2022-06-23 NOTE — Progress Notes (Signed)
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 - Noted on MRI in July 2023, obtained by neurosurgeon with possible findings of discitis/osteomyelitis and hardware failure.  Infectious versus positive for telemetry changes.  Inflammatory markers were not elevated and difficult to interpret in the setting of AKI, UTI, foot ulcer.  IR guided disc aspiration for further information. - MRI T-spine ordered showing discitis/osteomyelitis T10-11.  Diffuse epidural thickening and has a T8-T11 pedicle infection.  Compared to MRI on 05/13/2021 there appears to be progressive endplate destruction and progressive fluid displaced T10-11. #UTI with klleb pneumo - Patient continues to have symptomatic UTI with dysuria and abdominal tenderness -CT abdomen pelvis on admission showed no evidence of focal renal abnormalities, or urinary bladder. #CKD stage IV - Continue to monitor creatinine #Type 2 diabetes  Recommendations: --Continue ceftriaxone as patient continues to complain of dysuria -Hopefully can transition to p.o. regimen tomorrow - ID would like to get disc aspiration off of antibiotics.  From a scheduled perspective getting discharged to do so outpatient as/inpatient admission aspiration would be preferred. - Begin follow-up disc aspiration cultures outpatient.  Patient would like to follow with Dr. Megan Salon   Microbiology:    Antibiotics: CTX Cultures:   Urine Kleb pneumo   SUBJECTIVE: Resting in bed.  Reports dysuria with suprapubic pain Interval: Afebrile overnight.  Review of Systems: Review of Systems  All other systems reviewed and are negative.    Scheduled Meds:  carvedilol  6.25 mg Oral Daily   DULoxetine  60 mg Oral Daily   insulin aspart  0-9 Units Subcutaneous Q4H   insulin glargine-yfgn  8 Units Subcutaneous QHS   mometasone-formoterol  2 puff Inhalation BID   montelukast  10 mg Oral QHS   mupirocin cream   Topical Daily   pregabalin  75 mg Oral TID   Continuous Infusions:  sodium chloride Stopped (06/22/22 1808)   cefTRIAXone (ROCEPHIN)  IV 2 g (06/22/22 2107)   PRN Meds:.albuterol, diazepam, methocarbamol, naLOXone (NARCAN)  injection, ondansetron (ZOFRAN) IV, mouth rinse, oxyCODONE Allergies  Allergen Reactions   Bee Pollen Anaphylaxis   Broccoli [Brassica Oleracea] Anaphylaxis and Hives   Fish-Derived Products Anaphylaxis and Hives   Mushroom Extract Complex Anaphylaxis   Penicillins Anaphylaxis    **Tolerated cefepime March 2021 Did it involve swelling of the face/tongue/throat, SOB, or low BP? Yes Did it involve sudden or severe rash/hives, skin peeling, or any reaction on the inside of your mouth or nose? No Did you need to seek medical attention at a hospital or doctor's office? Yes When did it last happen?  A few months ago If all above answers are "NO", may proceed with cephalosporin use.   Rosemary Oil Anaphylaxis   Shellfish Allergy Anaphylaxis and Hives   Tomato Anaphylaxis and Hives    Raw tomatoes only, can tolerate if cooked   Acetaminophen Other (See Comments)    GI upset  Aloe Vera Hives   Acyclovir And Related Other (See Comments)    Unknown reaction   Naproxen Hives    OBJECTIVE: Vitals:   06/22/22 0929 06/22/22 1349 06/22/22 2010 06/23/22 0431  BP: (!) 154/52 (!) 150/61 (!) 131/55 (!) 165/63  Pulse: 72 70 79 79  Resp: 16 16 19 20   Temp:   (!) 97.4 F (36.3 C) 97.9 F (36.6 C) 99.2 F (37.3 C)  TempSrc:  Oral    SpO2: 99% 93% 96% 91%  Weight:      Height:       Body mass index is 32.2 kg/m.  Physical Exam Constitutional:      Appearance: Normal appearance.  HENT:     Head: Normocephalic and atraumatic.     Right Ear: Tympanic membrane normal.     Left Ear: Tympanic membrane normal.     Nose: Nose normal.     Mouth/Throat:     Mouth: Mucous membranes are moist.  Eyes:     Extraocular Movements: Extraocular movements intact.     Conjunctiva/sclera: Conjunctivae normal.     Pupils: Pupils are equal, round, and reactive to light.  Cardiovascular:     Rate and Rhythm: Normal rate and regular rhythm.     Heart sounds: No murmur heard.    No friction rub. No gallop.  Pulmonary:     Effort: Pulmonary effort is normal.     Breath sounds: Normal breath sounds.  Abdominal:     General: Abdomen is flat.     Palpations: Abdomen is soft.  Musculoskeletal:        General: Normal range of motion.  Skin:    General: Skin is warm and dry.  Neurological:     General: No focal deficit present.     Mental Status: She is alert and oriented to person, place, and time.  Psychiatric:        Mood and Affect: Mood normal.       Lab Results Lab Results  Component Value Date   WBC 9.9 06/21/2022   HGB 9.5 (L) 06/21/2022   HCT 31.3 (L) 06/21/2022   MCV 88.7 06/21/2022   PLT 221 06/21/2022    Lab Results  Component Value Date   CREATININE 3.80 (H) 06/21/2022   BUN 52 (H) 06/21/2022   NA 139 06/21/2022   K 4.8 06/21/2022   CL 113 (H) 06/21/2022   CO2 19 (L) 06/21/2022    Lab Results  Component Value Date   ALT 8 06/21/2022   AST 16 06/21/2022   ALKPHOS 87 06/21/2022   BILITOT 0.5 06/21/2022        Laurice Record, [place] [facility]ious [facility] 06/23/2022, 6:31 AM

## 2022-06-24 ENCOUNTER — Encounter (HOSPITAL_COMMUNITY): Payer: Self-pay | Admitting: Internal Medicine

## 2022-06-24 DIAGNOSIS — I10 Essential (primary) hypertension: Secondary | ICD-10-CM

## 2022-06-24 DIAGNOSIS — I82503 Chronic embolism and thrombosis of unspecified deep veins of lower extremity, bilateral: Secondary | ICD-10-CM

## 2022-06-24 DIAGNOSIS — N3000 Acute cystitis without hematuria: Secondary | ICD-10-CM | POA: Diagnosis not present

## 2022-06-24 DIAGNOSIS — M869 Osteomyelitis, unspecified: Secondary | ICD-10-CM | POA: Diagnosis not present

## 2022-06-24 LAB — HEPARIN LEVEL (UNFRACTIONATED)
Heparin Unfractionated: 0.12 IU/mL — ABNORMAL LOW (ref 0.30–0.70)
Heparin Unfractionated: 0.4 IU/mL (ref 0.30–0.70)
Heparin Unfractionated: 0.29 IU/mL — ABNORMAL LOW (ref 0.30–0.70)

## 2022-06-24 LAB — CK: Total CK: 107 U/L (ref 38–234)

## 2022-06-24 LAB — RENAL FUNCTION PANEL
Albumin: 2.3 g/dL — ABNORMAL LOW (ref 3.5–5.0)
Anion gap: 5 (ref 5–15)
BUN: 58 mg/dL — ABNORMAL HIGH (ref 8–23)
CO2: 16 mmol/L — ABNORMAL LOW (ref 22–32)
Calcium: 8 mg/dL — ABNORMAL LOW (ref 8.9–10.3)
Chloride: 120 mmol/L — ABNORMAL HIGH (ref 98–111)
Creatinine, Ser: 4.48 mg/dL — ABNORMAL HIGH (ref 0.44–1.00)
GFR, Estimated: 10 mL/min — ABNORMAL LOW (ref >60)
Glucose, Bld: 127 mg/dL — ABNORMAL HIGH (ref 70–99)
Phosphorus: 5.1 mg/dL — ABNORMAL HIGH (ref 2.5–4.6)
Potassium: 4.4 mmol/L (ref 3.5–5.1)
Sodium: 141 mmol/L (ref 135–145)

## 2022-06-24 LAB — GLUCOSE, CAPILLARY
Glucose-Capillary: 109 mg/dL — ABNORMAL HIGH (ref 70–99)
Glucose-Capillary: 112 mg/dL — ABNORMAL HIGH (ref 70–99)
Glucose-Capillary: 116 mg/dL — ABNORMAL HIGH (ref 70–99)
Glucose-Capillary: 121 mg/dL — ABNORMAL HIGH (ref 70–99)
Glucose-Capillary: 134 mg/dL — ABNORMAL HIGH (ref 70–99)
Glucose-Capillary: 96 mg/dL (ref 70–99)

## 2022-06-24 MED ORDER — HEPARIN BOLUS VIA INFUSION
1500.0000 [IU] | Freq: Once | INTRAVENOUS | Status: AC
  Administered 2022-06-24: 1500 [IU] via INTRAVENOUS
  Filled 2022-06-24: qty 1500

## 2022-06-24 MED ORDER — STERILE WATER FOR INJECTION IV SOLN
INTRAVENOUS | Status: DC
  Filled 2022-06-24 (×2): qty 150
  Filled 2022-06-24: qty 1000

## 2022-06-24 MED ORDER — HYDRALAZINE HCL 20 MG/ML IJ SOLN
5.0000 mg | Freq: Once | INTRAMUSCULAR | Status: AC
  Administered 2022-06-24: 5 mg via INTRAVENOUS
  Filled 2022-06-24: qty 1

## 2022-06-24 MED ORDER — ACETAMINOPHEN 325 MG PO TABS
650.0000 mg | ORAL_TABLET | Freq: Four times a day (QID) | ORAL | Status: DC | PRN
  Administered 2022-06-25: 650 mg via ORAL
  Filled 2022-06-24: qty 2

## 2022-06-24 MED ORDER — FENTANYL CITRATE PF 50 MCG/ML IJ SOSY
25.0000 ug | PREFILLED_SYRINGE | INTRAMUSCULAR | Status: DC | PRN
  Administered 2022-06-24 – 2022-06-25 (×5): 25 ug via INTRAVENOUS
  Filled 2022-06-24 (×5): qty 1

## 2022-06-24 NOTE — Progress Notes (Addendum)
ANTICOAGULATION CONSULT NOTE - follow up  Pharmacy Consult for Heparin Indication: DVT/PE, apixaban held  Allergies  Allergen Reactions   Bee Pollen Anaphylaxis   Broccoli [Brassica Oleracea] Anaphylaxis and Hives   Fish-Derived Products Anaphylaxis and Hives   Mushroom Extract Complex Anaphylaxis   Penicillins Anaphylaxis    **Tolerated cefepime March 2021 Did it involve swelling of the face/tongue/throat, SOB, or low BP? Yes Did it involve sudden or severe rash/hives, skin peeling, or any reaction on the inside of your mouth or nose? No Did you need to seek medical attention at a hospital or doctor's office? Yes When did it last happen?  A few months ago If all above answers are "NO", may proceed with cephalosporin use.   Rosemary Oil Anaphylaxis   Shellfish Allergy Anaphylaxis and Hives   Tomato Anaphylaxis and Hives    Raw tomatoes only, can tolerate if cooked   Acetaminophen Other (See Comments)    GI upset   Aloe Vera Hives   Acyclovir And Related Other (See Comments)    Unknown reaction   Naproxen Hives    Patient Measurements: Height: 5' 1"  (154.9 cm) Weight: 83.6 kg (184 lb 4.9 oz) IBW/kg (Calculated) : 47.8 Heparin Dosing Weight: 65 kg  Vital Signs: Temp: 98.2 F (36.8 C) (08/26 1300) Temp Source: Oral (08/26 1300) BP: 176/65 (08/26 1300) Pulse Rate: 78 (08/26 1300)  Labs: Recent Labs    06/23/22 0520 06/23/22 0620 06/23/22 1708 06/24/22 0342 06/24/22 0602 06/24/22 1255  HGB  --  8.7*  --   --   --   --   HCT  --  28.7*  --   --   --   --   PLT  --  171  --   --   --   --   APTT  --   --  29  --   --   --   LABPROT 14.8  --   --   --   --   --   INR 1.2  --   --   --   --   --   HEPARINUNFRC  --   --  <0.10* 0.12*  --  0.40  CREATININE  --  4.89*  --   --  4.48*  --   CKTOTAL  --   --   --   --  107  --      Estimated Creatinine Clearance (by C-G formula based on SCr of 4.48 mg/dL (H)) Female: 12.1 mL/min (A) Female: 14.9 mL/min  (A)   Medical History: Past Medical History:  Diagnosis Date   Acute MI ([place]) 2007   presented to ED & had cardiac cath- but found to have normal coronaries. Since that point in time her PCP cares f or cardiac needs. Dr. 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 ([place]) 03/16/2020   Chronic kidney disease    "had transplant when I was 13; doesn't bother me now" (03/20/2013)   Cirrhosis of liver without mention of alcohol    Constipation    Dehiscence of closure of skin    left partial calcaneal excision   Depression    Diabetes mellitus    insulin dependent, adult onset  Diarrhea 09/04/2019   Episode of visual loss of left eye    Essential hypertension 12/23/2006   Qualifier: Diagnosis of  By: Larose Kells MD, [place]    Exertional shortness of breath    Fatty liver    Fibromyalgia    GERD (gastroesophageal reflux disease)    Hepatic steatosis    High cholesterol    History of MI (myocardial infarction) 10/06/2013   Hyperlipidemia 06/03/2008   Qualifier: Diagnosis of  By: Larose Kells MD, [place]    Hypertension    MRSA (methicillin resistant Staphylococcus aureus)    Neuropathy    lower legs   Osteoarthritis    hands, hips   Proximal humerus fracture 10/15/2012   Left   PTSD (post-traumatic stress disorder)    Renal transplant, 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   Retinopathy 04/17/2019   Sleep apnea 11/16/2017   SOB (shortness of breath) 10/11/2020   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   Qualifier: Diagnosis of  By: Charlott Holler CMA, [place]     Type 2 diabetes mellitus with hyperglycemia, with long-term current use of insulin ([place]) 04/06/2017    Medications:  Scheduled:   carvedilol  6.25 mg Oral Daily   Chlorhexidine Gluconate Cloth  6 each Topical Daily   DULoxetine  60 mg Oral Daily   feeding  supplement (GLUCERNA SHAKE)  237 mL Oral TID BM   insulin aspart  0-9 Units Subcutaneous Q4H   insulin glargine-yfgn  8 Units Subcutaneous QHS   mometasone-formoterol  2 puff Inhalation BID   montelukast  10 mg Oral QHS   mupirocin cream   Topical Daily   pregabalin  75 mg Oral TID   Infusions:   cefTRIAXone (ROCEPHIN)  IV Stopped (06/24/22 0840)   DAPTOmycin (CUBICIN) 500 mg in sodium chloride 0.9 % IVPB Stopped (06/23/22 1758)   heparin 1,250 Units/hr (06/24/22 0438)   sodium bicarbonate 150 mEq in sterile water 1,150 mL infusion 75 mL/hr at 06/24/22 1215    Assessment: 63 yoF admitted on 8/22 with possible Discitis/OM, s/p aspiration on 8/25.  PMH is significant for chronic DVT and history of PE on Eliquis prior to admission.  Pharmacy is consulted to dose Heparin while Eliquis is held for possible surgical procedures next week.    PTA Eliquis 2.5 BID - pt reports last dose taken on 8/19 (stopped in anticipation of procedure).  Note patient only fills every 2 months for 30 day supply.    Renal function:  AoCKD with SCr increased to 4.89 (baseline 2.5-3, s/p renal transplant 2014)  Baseline HL < 0.1, aPTT 29  Today, 06/24/2022:  HL 0.40, therapeutic on 12500 units/hr Hgb low but stable, plt WNL  Per RN no line  interruptions and no bleeding issues   Goal of Therapy:  Heparin level 0.3-0.7 units/ml aPTT 66-102 seconds Monitor platelets by anticoagulation protocol: Yes   Plan:  Continue heparin drip at  1250 units/hr Obtain confirmatory Heparin level  in 8 hours  Daily heparin level and CBC   Royetta Asal, PharmD, BCPS 06/24/2022 2:07 PM  2050 HL 0.29 subtherapeutic on 1250 units/hr Per RN no interruptions or bleeding Increase heparin drip to 1450 units/hr Heparin level with am labs  Dolly Rias RPh 06/24/2022, 10:09 PM

## 2022-06-24 NOTE — Progress Notes (Signed)
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  Back pain is usually chronic but worsened.  She has been following with neurosurgery as outpatient, she has extensive vertebral damages that has been under investigation.  Work-up is revealed negative abdominal pathology on CT however she does have T10-11 disc space infection.      Subjective   Complains of suprapubic pain.  Urine culture grew Klebsiella pneumoniae; sensitive to ceftriaxone.   Assessment/Plan:    Abdominal pain -Patient has tenderness in suprapubic region -She does have a UTI, urine culture grew Klebsiella pneumoniae sensitive to ceftriaxone -CT abdomen/pelvis on 06/18/2022 showed no acute GI/GU abnormality -Leukocytosis has resolved ?  Radiation from discitis  Acute kidney injury on CKD stage IV -Patient is status post renal transplant 50 years ago -She is not immunocompromised and not on any immunosuppressants -Creatinine still up to 4.48 -Nephrology consulted  T10/T11 discitis/vertebral osteomyelitis -Patient underwent IR T10/11 disc space aspiration/biopsy -Culture results from aspirate showed no growth till date -Patient started on daptomycin along with ceftriaxone as above to cover empiric regimen for discitis   Chronic DVT -Patient was taking Eliquis which is currently on hold since 06/18/2022 -Neurosurgery has no plan to take patient for surgery -Okay to start prophylactic anticoagulation -Due to GFR Eliquis no longer  indicated -Patient started on heparin per pharmacy  Diabetes mellitus type 2 -Continue sliding scale insulin NovoLog -Continue Lantus 8 units subcu daily  HFpEF -Patient appears euvolemic at this time -Lasix on hold   Medications     carvedilol  6.25 mg Oral Daily   Chlorhexidine Gluconate Cloth  6 each Topical Daily   DULoxetine  60 mg Oral Daily   feeding supplement (GLUCERNA SHAKE)  237 mL Oral TID BM   insulin aspart  0-9 Units Subcutaneous Q4H   insulin glargine-yfgn  8 Units Subcutaneous QHS   mometasone-formoterol  2 puff Inhalation BID   montelukast  10 mg Oral QHS   mupirocin cream   Topical Daily   pregabalin  75 mg Oral TID     Data Reviewed:   CBG:  Recent Labs  Lab 06/23/22 2123 06/24/22 0013 06/24/22 0428 06/24/22 0738 06/24/22 1209  GLUCAP 142* 116* 96 112* 134*    SpO2: 91 % O2 Flow Rate (L/min): 2 L/min    Vitals:   06/23/22 2122 06/24/22 0430 06/24/22 0430 06/24/22 1300  BP: (!) 191/86  (!) 193/63 (!) 176/65  Pulse: 80  83 78  Resp: 18  18 (!) 22  Temp: 97.7 F (36.5 C)  98.3 F (36.8 C) 98.2 F (36.8 C)  TempSrc: Oral  Oral Oral  SpO2: 97%  96% 91%  Weight:  83.6 kg    Height:          Data Reviewed:  Basic Metabolic Panel: Recent Labs  Lab 06/20/22 1813 06/20/22 1822 06/21/22 0448 06/23/22 0620 06/24/22 0602  NA 140 141 139 141 141  K 4.6 4.6 4.8 4.6 4.4  CL 113* 111 113* 116* 120*  CO2 17*  --  19* 17* 16*  GLUCOSE  156* 151* 93 95 127*  BUN 55* 49* 52* 67* 58*  CREATININE 3.88* 4.20* 3.80* 4.89* 4.48*  CALCIUM 9.7  --  8.9 8.1* 8.0*  PHOS  --   --   --   --  5.1*    CBC: Recent Labs  Lab 06/20/22 1813 06/20/22 1822 06/21/22 0448 06/23/22 0620  WBC 10.9*  --  9.9 6.5  NEUTROABS 9.4*  --  7.4  --   HGB 11.1* 11.9* 9.5* 8.7*  HCT 35.7* 35.0* 31.3* 28.7*  MCV 86.7  --  88.7 90.8  PLT 249  --  221 171    LFT Recent Labs  Lab 06/20/22 1813 06/21/22 0448 06/23/22 0620 06/24/22 0602  AST 15 16 15   --    ALT 10 8 10   --   ALKPHOS 110 87 81  --   BILITOT 0.8 0.5 0.4  --   PROT 8.9* 7.5 6.9  --   ALBUMIN 3.4* 2.7* 2.7* 2.3*     Antibiotics: Anti-infectives (From admission, onward)    Start     Dose/Rate Route Frequency Ordered Stop   06/23/22 1600  DAPTOmycin (CUBICIN) 500 mg in sodium chloride 0.9 % IVPB        8 mg/kg  59.6 kg (Adjusted) 120 mL/hr over 30 Minutes Intravenous Every 48 hours 06/23/22 1346     06/23/22 1500  DAPTOmycin (CUBICIN) 500 mg in sodium chloride 0.9 % IVPB  Status:  Discontinued        8 mg/kg  59.6 kg (Adjusted) 120 mL/hr over 30 Minutes Intravenous Daily 06/23/22 1339 06/23/22 1346   06/21/22 2200  cefTRIAXone (ROCEPHIN) 2 g in sodium chloride 0.9 % 100 mL IVPB        2 g 200 mL/hr over 30 Minutes Intravenous Every 24 hours 06/21/22 0025     06/21/22 0108  vancomycin variable dose per unstable renal function (pharmacist dosing)  Status:  Discontinued         Does not apply See admin instructions 06/21/22 0108 06/21/22 0906   06/21/22 0045  vancomycin (VANCOREADY) IVPB 1500 mg/300 mL        1,500 mg 150 mL/hr over 120 Minutes Intravenous  Once 06/21/22 0031 06/21/22 0255   06/20/22 2145  cefTRIAXone (ROCEPHIN) 2 g in sodium chloride 0.9 % 100 mL IVPB        2 g 200 mL/hr over 30 Minutes Intravenous  Once 06/20/22 2133 06/20/22 2312        DVT prophylaxis: Heparin  Code Status: Full code  Family Communication:    CONSULTS    Objective    Physical Examination:   General-appears in no acute distress Heart-S1-S2, regular, no murmur auscultated Lungs-clear to auscultation bilaterally, no wheezing or crackles auscultated Abdomen-soft, tenderness to palpation in suprapubic region, no organomegaly Extremities-no edema in the lower extremities Neuro-alert, oriented x3, no focal deficit noted  Status is: Inpatient:             Oswald Hillock   [facility] If 7PM-7AM, please contact night-coverage at [email or web address], Office   [phone]   06/24/2022, 2:50 PM  LOS: 4 days

## 2022-06-24 NOTE — Plan of Care (Signed)
  Problem: Fluid Volume: Goal: Ability to maintain a balanced intake and output will improve Outcome: Progressing

## 2022-06-24 NOTE — Progress Notes (Signed)
ANTICOAGULATION CONSULT NOTE - follow up  Pharmacy Consult for Heparin Indication: DVT/PE, apixaban held  Allergies  Allergen Reactions   Bee Pollen Anaphylaxis   Broccoli [Brassica Oleracea] Anaphylaxis and Hives   Fish-Derived Products Anaphylaxis and Hives   Mushroom Extract Complex Anaphylaxis   Penicillins Anaphylaxis    **Tolerated cefepime March 2021 Did it involve swelling of the face/tongue/throat, SOB, or low BP? Yes Did it involve sudden or severe rash/hives, skin peeling, or any reaction on the inside of your mouth or nose? No Did you need to seek medical attention at a hospital or doctor's office? Yes When did it last happen?  A few months ago If all above answers are "NO", may proceed with cephalosporin use.   Rosemary Oil Anaphylaxis   Shellfish Allergy Anaphylaxis and Hives   Tomato Anaphylaxis and Hives    Raw tomatoes only, can tolerate if cooked   Acetaminophen Other (See Comments)    GI upset   Aloe Vera Hives   Acyclovir And Related Other (See Comments)    Unknown reaction   Naproxen Hives    Patient Measurements: Height: 5' 1"  (154.9 cm) Weight: 77.3 kg (170 lb 6.7 oz) IBW/kg (Calculated) : 47.8 Heparin Dosing Weight: 65 kg  Vital Signs: Temp: 97.7 F (36.5 C) (08/25 2122) Temp Source: Oral (08/25 2122) BP: 191/86 (08/25 2122) Pulse Rate: 80 (08/25 2122)  Labs: Recent Labs    06/21/22 0448 06/23/22 0520 06/23/22 0620 06/23/22 1708 06/24/22 0342  HGB 9.5*  --  8.7*  --   --   HCT 31.3*  --  28.7*  --   --   PLT 221  --  171  --   --   APTT  --   --   --  29  --   LABPROT  --  14.8  --   --   --   INR  --  1.2  --   --   --   HEPARINUNFRC  --   --   --  <0.10* 0.12*  CREATININE 3.80*  --  4.89*  --   --      Estimated Creatinine Clearance (by C-G formula based on SCr of 4.89 mg/dL (H)) Female: 10.6 mL/min (A) Female: 13.1 mL/min (A)   Medical History: Past Medical History:  Diagnosis Date   Acute MI ([place]) 2007   presented to ED  & had cardiac cath- but found to have normal coronaries. Since that point in time her PCP cares f or cardiac needs. Dr. 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   Retinopathy 04/17/2019   Sleep apnea 11/16/2017   SOB (shortness of breath) 10/11/2020   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   Qualifier: Diagnosis of  By: Charlott Holler CMA, [place]     Type 2 diabetes mellitus with hyperglycemia, with long-term current use of insulin ([place]) 04/06/2017    Medications:  Scheduled:   carvedilol  6.25 mg Oral Daily   Chlorhexidine Gluconate Cloth  6 each Topical Daily   DULoxetine  60 mg Oral Daily   feeding supplement (GLUCERNA SHAKE)  237 mL Oral TID BM   insulin aspart  0-9 Units Subcutaneous Q4H   insulin  glargine-yfgn  8 Units Subcutaneous QHS   mometasone-formoterol  2 puff Inhalation BID   montelukast  10 mg Oral QHS   mupirocin cream   Topical Daily   pregabalin  75 mg Oral TID   Infusions:   sodium chloride Stopped (06/22/22 1808)   cefTRIAXone (ROCEPHIN)  IV 2 g (06/23/22 2134)   DAPTOmycin (CUBICIN) 500 mg in sodium chloride 0.9 % IVPB Stopped (06/23/22 1758)   heparin 1,000 Units/hr (06/23/22 1927)    Assessment: 78 yoF admitted on 8/22 with possible Discitis/OM, s/p aspiration on 8/25.  PMH is significant for chronic DVT and history of PE on Eliquis prior to admission.  Pharmacy is consulted to dose Heparin while Eliquis is held for possible surgical procedures next week.    PTA Eliquis 2.5 BID - pt reports last dose taken on 8/19 (stopped in anticipation of procedure).  Note patient only fills every 2 months for 30 day supply.    Renal function:  AoCKD with SCr increased to 4.89 (baseline 2.5-3, s/p renal transplant 2014)  Baseline HL < 0.1, aPTT 29  Today, 06/24/2022:  HL 0.12 subtherapeutic on 1000 units/hr Per RN no interruptions and no bleeding  Goal of Therapy:  Heparin level 0.3-0.7 units/ml aPTT 66-102 seconds Monitor platelets by anticoagulation protocol: Yes   Plan:  Give heparin 1500 units bolus IV x 1 Increase heparin drip to 1250 units/hr Heparin level 8 hours  Daily heparin level and CBC   Dolly Rias RPh 06/24/2022, 4:26 AM

## 2022-06-24 NOTE — Progress Notes (Addendum)
[facility] Progress Note  Subjective: 1900 UOP overnight and 1100 this am. Creat down from 4.8 to 4.5 this am. BP's are good.   Vitals:   06/23/22 1309 06/23/22 2122 06/24/22 0430 06/24/22 0430  BP: (!) 112/53 (!) 191/86  (!) 193/63  Pulse: 67 80  83  Resp: 16 18  18   Temp: (!) 97.5 F (36.4 C) 97.7 F (36.5 C)  98.3 F (36.8 C)  TempSrc: Oral Oral  Oral  SpO2: 93% 97%  96%  Weight:   83.6 kg   Height:        Exam: Gen alert, elderly pleasant lady on room air No jvd or bruits Chest clear bilat to bases, no rales/ wheezing RRR no MRG Abd soft ntnd no mass or ascites +bs Ext no LE edema Neuro is alert, Ox 3 , nf, no asterixis    Home meds include - albuterol, carvedilol 6.25 bid, diazepam prn, duloxetine, fluticasone- salmeterol, insulin glargine, montelukast, pregabalin, prns/ vits/ supps       Date                         Creat               eGFR     2009- 2014              0.70- 1.00     2015- 2017              1.30- 1.70     2018- 2019              1.42- 1.90     2020                        1.50- 3.47     2021                        1.40- 7.47     2022                        2.11- 8.24     Nov 14, 2021           3.44                 14 ml/min     Feb 2023                 3.96 >> 2.49    14- 21 ml/min     Mar 2023                 2.59- 3.06        16 ml/min     April 2023                3.1- 3.37          14 Mar 2022                3.58- 3.88        10 May 2022                 3.60- 4.35        11     8/22                         3.88  12     8/23                         4.20     8/24                         3.80     06/23/22                    4.89                 9           UA - 100 prot, >50 wbc, 11-20 rbc, 0-5 epis, many bact     Admitted w/ BP 133/65, today 150/52, lowest BP here 108/49     Got 1 LR in ED on 8/22, then IVF at 125 /hr and now getting IVF 75 cc/hr     I/O = 3.2 L in and 1.5 L out = +1.7 L     Relevant IP  meds - coreg 6.25 bid, IV rocephin/ daptomycin, 1 dose IV vanc on 8/23, no contrast      CT abd 8/22 - Urinary Tract: no hydronephrosis. Calcification in the upper right kidney may be nonobstructing stone or vascular. No evidence of focal renal abnormality on this unenhanced exam. Unremarkable urinary bladder.     Assessment/ Plan: AKI on CKD IV - b/l creat is 2.6- 3.0 from march 2023, eGFR 16-20 ml/min. Creat here is 3.8 on admit in setting of urinary retention, n/v at home, poss UTI and vertebral discitis/ poss osteomyelitis. BP's are good. UA showed ^wbc's. CT w/o hydronephrosis. Suspect AKI due to vol depletion +/- urinary retention. Foley was placed, pt rebolused w/ 2 L and cont IVF's at 75 cc/hr. Creat peak 4.8 yesterday, down to 4.5 this am. No uremic signs/ symptoms, will cont IVF's.  Metabolic acidosis - due to renal failure. Will change IVF's to bicarb gtt at 75 cc/hr.  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 IV abx per pmd. DM2 - on insulin HTN - on coreg low dose bid, stable BP's H/o renal transplant - pt states she was adopted and that everybody has always told her that when she was a kid she had a kidney transplant. There is no physical scar on the abdomen and no transplant kidney apparent on any of her abdominal CT scans, I told her and her family member that there must have been a miscommunication/ misunderstanding about the kidney transplant issue and that she does not have a kidney transplant in her abdomen, and likely never had one.  I have deleted this item from her medical/ surgical history so as to not add further confusion.    Rob Bijan Ridgley 06/24/2022, 7:59 AM   Recent Labs  Lab 06/21/22 0448 06/23/22 0620 06/24/22 0602  HGB 9.5* 8.7*  --   ALBUMIN 2.7* 2.7* 2.3*  CALCIUM 8.9 8.1* 8.0*  PHOS  --   --  5.1*  CREATININE 3.80* 4.89* 4.48*  K 4.8 4.6 4.4   No results for input(s): "IRON", "TIBC", "FERRITIN" in the last 168  hours. Inpatient medications:  carvedilol  6.25 mg Oral Daily   Chlorhexidine Gluconate Cloth  6 each Topical Daily   DULoxetine  60 mg Oral Daily   feeding supplement (GLUCERNA SHAKE)  237 mL Oral TID BM   insulin aspart  0-9 Units Subcutaneous Q4H  insulin glargine-yfgn  8 Units Subcutaneous QHS   mometasone-formoterol  2 puff Inhalation BID   montelukast  10 mg Oral QHS   mupirocin cream   Topical Daily   pregabalin  75 mg Oral TID    sodium chloride Stopped (06/22/22 1808)   cefTRIAXone (ROCEPHIN)  IV 2 g (06/23/22 2134)   DAPTOmycin (CUBICIN) 500 mg in sodium chloride 0.9 % IVPB Stopped (06/23/22 1758)   heparin 1,250 Units/hr (06/24/22 0438)   albuterol, diazepam, methocarbamol, naLOXone (NARCAN)  injection, ondansetron (ZOFRAN) IV, mouth rinse, oxyCODONE

## 2022-06-25 DIAGNOSIS — B961 Klebsiella pneumoniae [K. pneumoniae] as the cause of diseases classified elsewhere: Secondary | ICD-10-CM | POA: Diagnosis not present

## 2022-06-25 DIAGNOSIS — M869 Osteomyelitis, unspecified: Secondary | ICD-10-CM | POA: Diagnosis not present

## 2022-06-25 DIAGNOSIS — N390 Urinary tract infection, site not specified: Secondary | ICD-10-CM | POA: Diagnosis not present

## 2022-06-25 DIAGNOSIS — N184 Chronic kidney disease, stage 4 (severe): Secondary | ICD-10-CM | POA: Diagnosis not present

## 2022-06-25 DIAGNOSIS — M4626 Osteomyelitis of vertebra, lumbar region: Secondary | ICD-10-CM | POA: Diagnosis not present

## 2022-06-25 LAB — CBC
HCT: 27.1 % — ABNORMAL LOW (ref 36.0–46.0)
Hemoglobin: 8.3 g/dL — ABNORMAL LOW (ref 12.0–15.0)
MCH: 27.6 pg (ref 26.0–34.0)
MCHC: 30.6 g/dL (ref 30.0–36.0)
MCV: 90 fL (ref 80.0–100.0)
Platelets: 137 10*3/uL — ABNORMAL LOW (ref 150–400)
RBC: 3.01 MIL/uL — ABNORMAL LOW (ref 3.87–5.11)
RDW: 15.5 % (ref 11.5–15.5)
WBC: 6.1 10*3/uL (ref 4.0–10.5)
nRBC: 0 % (ref 0.0–0.2)

## 2022-06-25 LAB — BASIC METABOLIC PANEL
Anion gap: 7 (ref 5–15)
BUN: 47 mg/dL — ABNORMAL HIGH (ref 8–23)
CO2: 17 mmol/L — ABNORMAL LOW (ref 22–32)
Calcium: 8.1 mg/dL — ABNORMAL LOW (ref 8.9–10.3)
Chloride: 115 mmol/L — ABNORMAL HIGH (ref 98–111)
Creatinine, Ser: 3.77 mg/dL — ABNORMAL HIGH (ref 0.44–1.00)
GFR, Estimated: 13 mL/min — ABNORMAL LOW (ref >60)
Glucose, Bld: 139 mg/dL — ABNORMAL HIGH (ref 70–99)
Potassium: 3.8 mmol/L (ref 3.5–5.1)
Sodium: 139 mmol/L (ref 135–145)

## 2022-06-25 LAB — GLUCOSE, CAPILLARY
Glucose-Capillary: 100 mg/dL — ABNORMAL HIGH (ref 70–99)
Glucose-Capillary: 104 mg/dL — ABNORMAL HIGH (ref 70–99)
Glucose-Capillary: 111 mg/dL — ABNORMAL HIGH (ref 70–99)
Glucose-Capillary: 113 mg/dL — ABNORMAL HIGH (ref 70–99)
Glucose-Capillary: 126 mg/dL — ABNORMAL HIGH (ref 70–99)
Glucose-Capillary: 92 mg/dL (ref 70–99)
Glucose-Capillary: 96 mg/dL (ref 70–99)

## 2022-06-25 LAB — HEPARIN LEVEL (UNFRACTIONATED): Heparin Unfractionated: 0.4 IU/mL (ref 0.30–0.70)

## 2022-06-25 MED ORDER — CARVEDILOL 12.5 MG PO TABS
12.5000 mg | ORAL_TABLET | Freq: Every day | ORAL | Status: DC
  Administered 2022-06-26 – 2022-07-11 (×16): 12.5 mg via ORAL
  Filled 2022-06-25 (×16): qty 1

## 2022-06-25 MED ORDER — AMLODIPINE BESYLATE 5 MG PO TABS
5.0000 mg | ORAL_TABLET | Freq: Every day | ORAL | Status: DC
  Administered 2022-06-25 – 2022-07-11 (×17): 5 mg via ORAL
  Filled 2022-06-25 (×17): qty 1

## 2022-06-25 MED ORDER — OXYCODONE HCL 5 MG PO TABS
5.0000 mg | ORAL_TABLET | ORAL | Status: DC | PRN
  Administered 2022-06-25 – 2022-06-29 (×12): 5 mg via ORAL
  Filled 2022-06-25 (×12): qty 1

## 2022-06-25 MED ORDER — ORAL CARE MOUTH RINSE
15.0000 mL | OROMUCOSAL | Status: DC
  Administered 2022-06-25 – 2022-06-29 (×13): 15 mL via OROMUCOSAL

## 2022-06-25 MED ORDER — PREGABALIN 25 MG PO CAPS
25.0000 mg | ORAL_CAPSULE | Freq: Three times a day (TID) | ORAL | Status: DC
  Administered 2022-06-25 – 2022-07-11 (×47): 25 mg via ORAL
  Filled 2022-06-25 (×47): qty 1

## 2022-06-25 MED ORDER — HYDROMORPHONE HCL 1 MG/ML IJ SOLN
1.0000 mg | INTRAMUSCULAR | Status: DC | PRN
  Administered 2022-06-26 – 2022-06-29 (×5): 1 mg via INTRAVENOUS
  Filled 2022-06-25 (×7): qty 1

## 2022-06-25 MED ORDER — ORAL CARE MOUTH RINSE
15.0000 mL | OROMUCOSAL | Status: DC | PRN
Start: 2022-06-25 — End: 2022-07-11

## 2022-06-25 MED ORDER — HYDRALAZINE HCL 20 MG/ML IJ SOLN
5.0000 mg | INTRAMUSCULAR | Status: DC | PRN
  Administered 2022-06-25 – 2022-06-29 (×3): 5 mg via INTRAVENOUS
  Filled 2022-06-25 (×3): qty 1

## 2022-06-25 MED ORDER — SIMETHICONE 80 MG PO CHEW
80.0000 mg | CHEWABLE_TABLET | Freq: Once | ORAL | Status: AC
  Administered 2022-06-25: 80 mg via ORAL
  Filled 2022-06-25: qty 1

## 2022-06-25 MED ORDER — OXYCODONE HCL 5 MG PO TABS
5.0000 mg | ORAL_TABLET | Freq: Four times a day (QID) | ORAL | Status: DC
  Administered 2022-06-25: 5 mg via ORAL
  Filled 2022-06-25 (×2): qty 1

## 2022-06-25 MED ORDER — SODIUM BICARBONATE 650 MG PO TABS
650.0000 mg | ORAL_TABLET | Freq: Two times a day (BID) | ORAL | Status: DC
  Administered 2022-06-25 – 2022-07-11 (×32): 650 mg via ORAL
  Filled 2022-06-25 (×32): qty 1

## 2022-06-25 NOTE — Progress Notes (Signed)
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 - Noted on MRI in July 2023, obtained by neurosurgeon with possible findings of discitis/osteomyelitis and hardware failure.  Infectious versus positive for telemetry changes.  Inflammatory markers were not elevated and difficult to interpret in the setting of AKI, UTI, foot ulcer.  IR guided disc aspiration for further information. - MRI T-spine ordered showing discitis/osteomyelitis T10-11.  Diffuse epidural thickening and has a T8-T11 pedicle infection.  Compared to MRI on 05/13/2021 there appears to be progressive endplate destruction and progressive fluid displaced T10-11. #UTI with klleb pneumo - Patient continues to have symptomatic UTI with dysuria and abdominal tenderness -CT abdomen pelvis on admission showed no evidence of focal renal abnormalities, or urinary bladder. #CKD stage IV - Continue to monitor creatinine #Type 2 diabetes  Recommendations: --Continue ceftriaxone and daptomycin(avoid nephrotoxicity with vancomycin), plan on 8 weeks of antibiotics.  - Follow-aspirate cultures(staph epi so far) - I am concerned about patient's abdominal tenderness and now distension. Will repeat CT.  CT done on 8/22 was without contrast to given her worsening renal function will avoid contrast.  Patient noted to have a renal ultrasound ordered.   - Plan to follow-up with Dr.  Megan Salon following discharge.  Microbiology:   Antibiotics: CTX and daptomycin   Urine Kleb pneumo   SUBJECTIVE: Resting in bed.  She reports abdominal pain has not improved. She noted a large BM yesterday.  Interval: Afebrile overnight.  Review of Systems: Review of Systems  All other systems reviewed and are negative.    Scheduled Meds:  amLODipine  5 mg Oral Daily   [START ON 06/26/2022] carvedilol  12.5 mg Oral Daily   Chlorhexidine Gluconate Cloth  6 each Topical Daily   DULoxetine  60 mg Oral Daily   feeding supplement (GLUCERNA SHAKE)  237 mL Oral TID BM   insulin aspart  0-9 Units Subcutaneous Q4H   insulin glargine-yfgn  8 Units Subcutaneous QHS   mometasone-formoterol  2 puff Inhalation BID   montelukast  10 mg Oral QHS   mupirocin cream   Topical Daily   mouth rinse  15 mL Mouth Rinse 4 times per day   pregabalin  25 mg Oral TID   simethicone  80 mg Oral Once   sodium bicarbonate  650 mg Oral BID   Continuous Infusions:  cefTRIAXone (ROCEPHIN)  IV Stopped (06/24/22 2133)   DAPTOmycin (CUBICIN) 500 mg in sodium chloride 0.9 % IVPB 500 mg (06/25/22 1707)   heparin 1,450 Units/hr (06/24/22 2223)   PRN Meds:.acetaminophen, albuterol, diazepam, hydrALAZINE, HYDROmorphone (DILAUDID) injection, methocarbamol, naLOXone (NARCAN)  injection, ondansetron (ZOFRAN) IV, mouth rinse, oxyCODONE Allergies  Allergen Reactions   Bee Pollen Anaphylaxis   Broccoli [Brassica Oleracea] Anaphylaxis and Hives   Fish-Derived Products Anaphylaxis and Hives   Mushroom Extract Complex Anaphylaxis   Penicillins Anaphylaxis    **Tolerated cefepime March 2021  Did it involve swelling of the face/tongue/throat, SOB, or low BP? Yes Did it involve sudden or severe rash/hives, skin peeling, or any reaction on the inside of your mouth or nose? No Did you need to seek medical attention at a hospital or doctor's office? Yes When did it last happen?  A few months ago If all above answers are  "NO", may proceed with cephalosporin use.   Rosemary Oil Anaphylaxis   Shellfish Allergy Anaphylaxis and Hives   Tomato Anaphylaxis and Hives    Raw tomatoes only, can tolerate if cooked   Acetaminophen Other (See Comments)    GI upset   Aloe Vera Hives   Acyclovir And Related Other (See Comments)    Unknown reaction   Naproxen Hives    OBJECTIVE: Vitals:   06/25/22 0458 06/25/22 0813 06/25/22 1200 06/25/22 1448  BP: (!) 193/91  (!) 190/72 (!) 169/69  Pulse: 82   77  Resp: 18   18  Temp: 98.6 F (37 C)   98.3 F (36.8 C)  TempSrc: Oral   Oral  SpO2: 94% 95%  94%  Weight:      Height:       Body mass index is 35.62 kg/m.  Physical Exam Constitutional:      Appearance: Normal appearance.  HENT:     Head: Normocephalic and atraumatic.     Right Ear: Tympanic membrane normal.     Left Ear: Tympanic membrane normal.     Nose: Nose normal.     Mouth/Throat:     Mouth: Mucous membranes are moist.  Eyes:     Extraocular Movements: Extraocular movements intact.     Conjunctiva/sclera: Conjunctivae normal.     Pupils: Pupils are equal, round, and reactive to light.  Cardiovascular:     Rate and Rhythm: Normal rate and regular rhythm.     Heart sounds: No murmur heard.    No friction rub. No gallop.  Pulmonary:     Effort: Pulmonary effort is normal.     Breath sounds: Normal breath sounds.  Abdominal:     General: There is distension.     Tenderness: There is abdominal tenderness.  Musculoskeletal:        General: Normal range of motion.  Skin:    General: Skin is warm and dry.  Neurological:     General: No focal deficit present.     Mental Status: She is alert and oriented to person, place, and time.  Psychiatric:        Mood and Affect: Mood normal.       Lab Results Lab Results  Component Value Date   WBC 6.1 06/25/2022   HGB 8.3 (L) 06/25/2022   HCT 27.1 (L) 06/25/2022   MCV 90.0 06/25/2022   PLT 137 (L) 06/25/2022    Lab Results  Component  Value Date   CREATININE 3.77 (H) 06/25/2022   BUN 47 (H) 06/25/2022   NA 139 06/25/2022   K 3.8 06/25/2022   CL 115 (H) 06/25/2022   CO2 17 (L) 06/25/2022    Lab Results  Component Value Date   ALT 10 06/23/2022   AST 15 06/23/2022   ALKPHOS 81 06/23/2022   BILITOT 0.4 06/23/2022        Laurice Record, [place] [facility] Infectious Disease [facility] 06/25/2022, 7:57 PM

## 2022-06-25 NOTE — Progress Notes (Signed)
ANTICOAGULATION CONSULT NOTE - follow up  Pharmacy Consult for Heparin Indication: DVT/PE, apixaban held  Allergies  Allergen Reactions   Bee Pollen Anaphylaxis   Broccoli [Brassica Oleracea] Anaphylaxis and Hives   Fish-Derived Products Anaphylaxis and Hives   Mushroom Extract Complex Anaphylaxis   Penicillins Anaphylaxis    **Tolerated cefepime March 2021 Did it involve swelling of the face/tongue/throat, SOB, or low BP? Yes Did it involve sudden or severe rash/hives, skin peeling, or any reaction on the inside of your mouth or nose? No Did you need to seek medical attention at a hospital or doctor's office? Yes When did it last happen?  A few months ago If all above answers are "NO", may proceed with cephalosporin use.   Rosemary Oil Anaphylaxis   Shellfish Allergy Anaphylaxis and Hives   Tomato Anaphylaxis and Hives    Raw tomatoes only, can tolerate if cooked   Acetaminophen Other (See Comments)    GI upset   Aloe Vera Hives   Acyclovir And Related Other (See Comments)    Unknown reaction   Naproxen Hives    Patient Measurements: Height: 5' 1"  (154.9 cm) Weight: 85.5 kg (188 lb 7.9 oz) IBW/kg (Calculated) : 47.8 Heparin Dosing Weight: 65 kg  Vital Signs: Temp: 98.6 F (37 C) (08/27 0458) Temp Source: Oral (08/27 0458) BP: 193/91 (08/27 0458) Pulse Rate: 82 (08/27 0458)  Labs: Recent Labs    06/23/22 0520 06/23/22 0620 06/23/22 1708 06/24/22 0342 06/24/22 0602 06/24/22 1255 06/24/22 2050 06/25/22 0604  HGB  --  8.7*  --   --   --   --   --  8.3*  HCT  --  28.7*  --   --   --   --   --  27.1*  PLT  --  171  --   --   --   --   --  137*  APTT  --   --  29  --   --   --   --   --   LABPROT 14.8  --   --   --   --   --   --   --   INR 1.2  --   --   --   --   --   --   --   HEPARINUNFRC  --   --  <0.10*   < >  --  0.40 0.29* 0.40  CREATININE  --  4.89*  --   --  4.48*  --   --  3.77*  CKTOTAL  --   --   --   --  107  --   --   --    < > = values in this  interval not displayed.     Estimated Creatinine Clearance (by C-G formula based on SCr of 3.77 mg/dL (H)) Female: 14.6 mL/min (A) Female: 17.9 mL/min (A)   Medical History: Past Medical History:  Diagnosis Date   Acute MI ([place]) 2007   presented to ED & had cardiac cath- but found to have normal coronaries. Since that point in time her PCP cares f or cardiac needs. Dr. 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 ([place]) 03/16/2020  Chronic kidney disease    "had transplant when I was 21; doesn't bother me now" (03/20/2013)   Cirrhosis of liver without mention of alcohol    Constipation    Dehiscence of closure of skin    left partial calcaneal excision   Depression    Diabetes mellitus    insulin dependent, adult onset   Diarrhea 09/04/2019   Episode of visual loss of left eye    Essential hypertension 12/23/2006   Qualifier: Diagnosis of  By: Larose Kells MD, [place]    Exertional shortness of breath    Fatty liver    Fibromyalgia    GERD (gastroesophageal reflux disease)    Hepatic steatosis    High cholesterol    History of MI (myocardial infarction) 10/06/2013   Hyperlipidemia 06/03/2008   Qualifier: Diagnosis of  By: Larose Kells MD, [place]    Hypertension    MRSA (methicillin resistant Staphylococcus aureus)    Neuropathy    lower legs   Osteoarthritis    hands, hips   Proximal humerus fracture 10/15/2012   Left   PTSD (post-traumatic stress disorder)    Retinopathy 04/17/2019   Sleep apnea 11/16/2017   SOB (shortness of breath) 10/11/2020   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   Qualifier: Diagnosis of  By: Charlott Holler CMA, [place]     Type 2 diabetes mellitus with hyperglycemia, with long-term current use of insulin ([place]) 04/06/2017    Medications:  Scheduled:   carvedilol  6.25 mg  Oral Daily   Chlorhexidine Gluconate Cloth  6 each Topical Daily   DULoxetine  60 mg Oral Daily   feeding supplement (GLUCERNA SHAKE)  237 mL Oral TID BM   insulin aspart  0-9 Units Subcutaneous Q4H   insulin glargine-yfgn  8 Units Subcutaneous QHS   mometasone-formoterol  2 puff Inhalation BID   montelukast  10 mg Oral QHS   mupirocin cream   Topical Daily   pregabalin  25 mg Oral TID   Infusions:   cefTRIAXone (ROCEPHIN)  IV Stopped (06/24/22 2133)   DAPTOmycin (CUBICIN) 500 mg in sodium chloride 0.9 % IVPB Stopped (06/23/22 1758)   heparin 1,450 Units/hr (06/24/22 2223)   sodium bicarbonate 150 mEq in sterile water 1,150 mL infusion 75 mL/hr at 06/25/22 7425    Assessment: 25 yoF admitted on 8/22 with possible Discitis/OM, s/p aspiration on 8/25.  PMH is significant for chronic DVT and history of PE on Eliquis prior to admission.  Pharmacy is consulted to dose Heparin while Eliquis is held for possible surgical procedures next week.    PTA Eliquis 2.5 BID - pt reports last dose taken on 8/19 (stopped in anticipation of procedure).  Note patient only fills every 2 months for 30 day supply.    Renal function:  AoCKD with SCr increased to 4.89 (baseline 2.5-3, s/p renal transplant 2014)  Baseline HL < 0.1, aPTT 29  Today, 06/25/2022:  HL 0.40 therapeutic on 1450 units/hr Hgb low but stable Plt 137  Per RN no interruptions and no bleeding or line issues   Goal of Therapy:  Heparin level 0.3-0.7 units/ml aPTT 66-102 seconds Monitor platelets by anticoagulation protocol: Yes   Plan:  Continue heparin drip at 1450 units/hr Monitor plts closely as have been trending down  Daily heparin level and CBC Monitor for signs and symptoms of bleeding    Royetta Asal, PharmD, BCPS 06/25/2022 9:12 AM

## 2022-06-25 NOTE — Progress Notes (Signed)
PROGRESS NOTE  [name] [name]  DOB: September 11, 1956  PCP: Ann Held, DO WUJ:[id number]  DOA: 06/20/2022  LOS: 5 days  Hospital Day: 6  Brief narrative: [name] [name] is a 66 y.o. 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  Patient had back surgery done 2.5 years ago and had screws placed.  For 2 to 3 weeks prior to this presentation, her back pain was worsening.  She also had urinary symptoms..  She has history of multiple infections in the past and at one point on long-term antibiotics until a year ago. 8/22, patient presented to the ED with complaint abdominal pain, nausea, vomiting for 4 days and worsening of chronic back pain. In the ED, WBC count was 10,000, creatinine worsened to 3.8 (baseline 2.5-3), CRP 2, ESR 126 Urinalysis showed WBCs and bacteria CT abdomen pelvis did not show any intra abdominal pathology but showed discitis/osteomyelitis of the T10-T11 area. Admitted to hospitalist service ID, neurosurgery, IR, nephrology consultations were obtained. See below for details  Subjective: Patient was seen and examined this morning.  Pleasant elderly Caucasian female.  She was in mild distress from pain. Chart reviewed No fever, blood pressure elevated to 190s overnight, breathing on room air Last set of blood work from this morning with WC count 6.1, hemoglobin 8.3, platelet 137, BUN/creatinine 47/3.77  Assessment and plan: T10/T11 discitis/vertebral osteomyelitis History of vertebral osteomyelitis, back surgeries Presented with worsening back pain for 2 to 3 weeks. CT on admission showed discitis/osteomyelitis of T10/T11 Neurosurgery, ID and IR consulted 8/25, patient underwent disc  aspiration by IR.  Culture sent. Currently on daptomycin and ceftriaxone per ID  Pain management It seems patient is on as needed IV fentanyl.  I will stop fentanyl at this time.  Start on scheduled oral Roxicodone, as needed IV Dilaudid.  Avoid morphine because of significant renal dysfunction.  Continue as needed Robaxin.  UTI Present with abdominal pain  Urinalysis with WBCs and bacteria Urine culture grew Klebsiella pneumoniae Currently improving on IV Rocephin WBC count improved. Recent Labs  Lab 06/20/22 1813 06/21/22 0448 06/23/22 0620 06/25/22 0604  WBC 10.9* 9.9 6.5 6.1   Acute kidney injury on CKD stage IV S/p renal transplant >50 years ago Baseline creatinine 2.6-3.  Creatinine elevated to 3.88 which worsened to peak at 4.89.  Likely due to infection.  No evidence of obstruction on CT scan.  Nephrology consultation was obtained.  Improving last 48 hours, 3.77 today. Currently has Foley in place for urine output monitoring. She is not immunocompromised and not on any immunosuppressants Recent Labs    03/22/22 1300 03/25/22 1911 05/04/22 1356 05/24/22 1314 06/20/22 1813 06/20/22 1822 06/21/22 0448 06/23/22 0620 06/24/22 0602 06/25/22 0604  BUN 63* 49* 57* 54* 55* 49* 52* 67* 58* 47*  CREATININE 3.88* 3.58* 3.60* 4.35* 3.88* 4.20* 3.80* 4.89* 4.48* 3.77*   Chronic DVT on Eliquis Currently on heparin drip.  Once there is definite plan from neurosurgery for no surgical intervention, we will switch her back to Eliquis.   Patient states that she had 1 incidence of DVT 3 years ago in her left leg.  She did not have recurrence.  She was however maintained on Eliquis.  I presume it is because of her risk of  recurrence of DVT due to bedbound status.  Type 2 diabetes mellitus A1c 4.6 on April 2023 PTA on Lantus 18 units daily. Currently blood sugar level is controlled on lower dose with Lantus 8 units daily along with sliding scale insulin and Accu-Cheks Continue  Lyrica for neuropathy.  Dose adjusted per pharmacy Recent Labs  Lab 06/24/22 1545 06/24/22 2029 06/25/22 0009 06/25/22 0459 06/25/22 0905  GLUCAP 109* 121* 96 100* 92    HFpEF Essential hypertension Liver cirrhosis Patient appears euvolemic at this time Continue Coreg.  Lasix on hold  CAD/MI Hyperlipidemia Does not seem to be in statin.  Acute on chronic anemia No active bleeding but hemoglobin seems to be running lower than baseline.  Continue to monitor. Recent Labs    01/18/22 1250 01/18/22 1251 02/08/22 1247 02/08/22 1248 02/14/22 1248 03/22/22 1300 03/22/22 1301 03/25/22 1911 05/16/22 1304 05/24/22 1314 05/24/22 1315 06/20/22 1813 06/20/22 1822 06/21/22 0448 06/23/22 0620 06/25/22 0604  HGB 7.7*  --  9.1*  --    < > 10.0*  --    < > 8.4* 8.5*  --  11.1* 11.9* 9.5* 8.7* 8.3*  MCV 94.4  --  93.5  --    < > 89.2  --    < > 90.7 92.4  --  86.7  --  88.7 90.8 90.0  FERRITIN 549*  --   --  448*  --  372*  --   --  349* 251  --   --   --   --   --   --   TIBC 213*  --  255  --   --  249*  --   --  259 260  --   --   --   --   --   --   IRON 46  --  76  --   --  41  --   --  37 34  --   --   --   --   --   --   RETICCTPCT  --  3.0 1.6  --   --   --  1.6  --  2.0  --  4.2*  --   --   --   --   --    < > = values in this interval not displayed.   Anxiety/depression Cymbalta daily, Valium as needed   Goals of care   Code Status: Full Code    Mobility: Bedbound/wheelchair bound for last more than 2 years  Skin assessment:     Nutritional status:  Body mass index is 35.62 kg/m.  Nutrition Problem: Increased nutrient needs Etiology: acute illness Signs/Symptoms: estimated needs     Diet:  Diet Order             Diet heart healthy/carb modified Room service appropriate? Yes; Fluid consistency: Thin  Diet effective now                   DVT prophylaxis:  SCDs Start: 06/22/22 1037   Antimicrobials: IV Rocephin, IV daptomycin Fluid: Bicarb  drip at 75 mill per hour Consultants: Nephrology, ID, neurosurgery Family Communication: None at bedside  Status is: Inpatient  Continue in-hospital care because: Needs IV antibiotics, inpatient monitoring, may need neurosurgical intervention as well Level of care: Med-Surg   Dispo: The patient is from: Home              Anticipated d/c is to: Pending clinical course  Patient currently is not medically stable to d/c.   Difficult to place patient No     Infusions:   cefTRIAXone (ROCEPHIN)  IV Stopped (06/24/22 2133)   DAPTOmycin (CUBICIN) 500 mg in sodium chloride 0.9 % IVPB Stopped (06/23/22 1758)   heparin 1,450 Units/hr (06/24/22 2223)   sodium bicarbonate 150 mEq in sterile water 1,150 mL infusion 75 mL/hr at 06/25/22 0700    Scheduled Meds:  carvedilol  6.25 mg Oral Daily   Chlorhexidine Gluconate Cloth  6 each Topical Daily   DULoxetine  60 mg Oral Daily   feeding supplement (GLUCERNA SHAKE)  237 mL Oral TID BM   insulin aspart  0-9 Units Subcutaneous Q4H   insulin glargine-yfgn  8 Units Subcutaneous QHS   mometasone-formoterol  2 puff Inhalation BID   montelukast  10 mg Oral QHS   mupirocin cream   Topical Daily   oxyCODONE  5 mg Oral Q6H   pregabalin  25 mg Oral TID    PRN meds: acetaminophen, albuterol, diazepam, hydrALAZINE, HYDROmorphone (DILAUDID) injection, methocarbamol, naLOXone (NARCAN)  injection, ondansetron (ZOFRAN) IV, mouth rinse   Antimicrobials: Anti-infectives (From admission, onward)    Start     Dose/Rate Route Frequency Ordered Stop   06/23/22 1600  DAPTOmycin (CUBICIN) 500 mg in sodium chloride 0.9 % IVPB        8 mg/kg  59.6 kg (Adjusted) 120 mL/hr over 30 Minutes Intravenous Every 48 hours 06/23/22 1346     06/23/22 1500  DAPTOmycin (CUBICIN) 500 mg in sodium chloride 0.9 % IVPB  Status:  Discontinued        8 mg/kg  59.6 kg (Adjusted) 120 mL/hr over 30 Minutes Intravenous Daily 06/23/22 1339 06/23/22 1346   06/21/22 2200   cefTRIAXone (ROCEPHIN) 2 g in sodium chloride 0.9 % 100 mL IVPB        2 g 200 mL/hr over 30 Minutes Intravenous Every 24 hours 06/21/22 0025     06/21/22 0108  vancomycin variable dose per unstable renal function (pharmacist dosing)  Status:  Discontinued         Does not apply See admin instructions 06/21/22 0108 06/21/22 0906   06/21/22 0045  vancomycin (VANCOREADY) IVPB 1500 mg/300 mL        1,500 mg 150 mL/hr over 120 Minutes Intravenous  Once 06/21/22 0031 06/21/22 0255   06/20/22 2145  cefTRIAXone (ROCEPHIN) 2 g in sodium chloride 0.9 % 100 mL IVPB        2 g 200 mL/hr over 30 Minutes Intravenous  Once 06/20/22 2133 06/20/22 2312       Objective: Vitals:   06/25/22 0458 06/25/22 0813  BP: (!) 193/91   Pulse: 82   Resp: 18   Temp: 98.6 F (37 C)   SpO2: 94% 95%    Intake/Output Summary (Last 24 hours) at 06/25/2022 1145 Last data filed at 06/25/2022 0930 Gross per 24 hour  Intake 2035.55 ml  Output 2000 ml  Net 35.55 ml   Filed Weights   06/22/22 0500 06/24/22 0430 06/25/22 0445  Weight: 77.3 kg 83.6 kg 85.5 kg   Weight change: 1.9 kg Body mass index is 35.62 kg/m.   Physical Exam: General exam: Pleasant, elderly Caucasian female.  Pain partially controlled Skin: No rashes, lesions or ulcers. HEENT: Atraumatic, normocephalic, no obvious bleeding Lungs: Diminished air entry in both bases otherwise clear to auscultation bilaterally CVS: Regular rate and rhythm, no murmur GI/Abd soft, mild tenderness in lower abdomen, bowel sound present CNS: Alert,  awake, oriented x3 Psychiatry: Sad affect Extremities: No pedal edema, no calf tenderness  Data Review: I have personally reviewed the laboratory data and studies available.  F/u labs ordered Unresulted Labs (From admission, onward)     Start     Ordered   06/26/22 2993  Basic metabolic panel  Daily at 5am,   R     Question:  Specimen collection method  Answer:  Lab=Lab collect   06/25/22 1145   06/25/22 0500   CBC  Daily at 5am,   R     Question:  Specimen collection method  Answer:  Lab=Lab collect   06/24/22 1409            Signed, Terrilee Croak, MD [facility] 06/25/2022

## 2022-06-25 NOTE — Progress Notes (Addendum)
[facility] Progress Note  Subjective: UOP 2100 yesterday, creat down 4.4 > 3.7 today  Vitals:   06/24/22 2031 06/25/22 0445 06/25/22 0458 06/25/22 0813  BP: (!) 198/85  (!) 193/91   Pulse: 82  82   Resp: 18  18   Temp: 97.8 F (36.6 C)  98.6 F (37 C)   TempSrc: Oral  Oral   SpO2: 94%  94% 95%  Weight:  85.5 kg    Height:        Exam: Gen alert, elderly pleasant lady on room air No jvd or bruits Chest clear bilat to bases, no rales/ wheezing RRR no MRG Abd soft ntnd no mass or ascites +bs Ext no LE edema Neuro is alert, Ox 3 , nf, no asterixis    Home meds include - albuterol, carvedilol 6.25 bid, diazepam prn, duloxetine, fluticasone- salmeterol, insulin glargine, montelukast, pregabalin, prns/ vits/ supps       Date                         Creat               eGFR     2009- 2014              0.70- 1.00     2015- 2017              1.30- 1.70     2018- 2019              1.42- 1.90     2020                        1.50- 3.47     2021                        1.40- 7.47     2022                        2.11- 8.24     Nov 14, 2021           3.44                 14 ml/min     Feb 2023                 3.96 >> 2.49    14- 21 ml/min     Mar 2023                 2.59- 3.06        16 ml/min     April 2023                3.1- 3.37          14 Mar 2022                3.58- 3.88        10 May 2022                 3.60- 4.35        11     8/22                         3.88                 12  8/23                         4.20     8/24                         3.80     06/23/22                    4.89                 9           UA - 100 prot, >50 wbc, 11-20 rbc, 0-5 epis, many bact     Admitted w/ BP 133/65, today 150/52, lowest BP here 108/49     Got 1 LR in ED on 8/22, then IVF at 125 /hr and now getting IVF 75 cc/hr     I/O = 3.2 L in and 1.5 L out = +1.7 L     Relevant IP meds - coreg 6.25 bid, IV rocephin/ daptomycin, 1 dose IV vanc on 8/23, no  contrast      CT abd 8/22 - Urinary Tract: no hydronephrosis. Calcification in the upper right kidney may be nonobstructing stone or vascular. No evidence of focal renal abnormality on this unenhanced exam. Unremarkable urinary bladder.     Assessment/ Plan: AKI on CKD IV - b/l creat is 2.6- 3.0 from march 2023, eGFR 16-20 ml/min. F/b Dr Carolin Sicks at [facility]. Creat here is 3.8 on admit in setting of urinary retention, n/v w/ new vertebral discitis/ poss osteomyelitis and poss UTI. UA showed ^wbc's. CT w/o hydronephrosis. Suspected AKI due to vol depletion + urinary retention. Foley placed and pt re-bolused 2 L and IVF"s cont'd. Creat peaked at 4.8 on 8/25, down to 4.5 yest and 3.7 today. Good UOP. AKI recovering. May need urology input for urine retention. Will dc IVF"s. Will follow.   Metabolic acidosis - due to renal failure. No longer hypovolemic, will dc IVF"s and start Na bicarb tabs 650 bid.  HTN - bp's were low and now they are high, prob due to IVF"s. I dc'd IVF"s,  ^'d coreg to 12.5 bid and added norvasc 5 qd.  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 IV abx per pmd. 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 She is not on any transplant meds, there is no physical scar on the abdomen and no transplant kidney apparent on any of her abdominal CT scans. Told her she did not have a kidney transplant and removed the diagnosis from her medical/ surgical hx.    Rob Jariah Jarmon 06/25/2022, 12:36 PM   Recent Labs  Lab 06/23/22 0620 06/24/22 0602 06/25/22 0604  HGB 8.7*  --  8.3*  ALBUMIN 2.7* 2.3*  --   CALCIUM 8.1* 8.0* 8.1*  PHOS  --  5.1*  --   CREATININE 4.89* 4.48* 3.77*  K 4.6 4.4 3.8    No results for input(s): "IRON", "TIBC", "FERRITIN" in the last 168 hours. Inpatient medications:  carvedilol  6.25 mg Oral Daily   Chlorhexidine  Gluconate Cloth  6 each Topical Daily   DULoxetine  60 mg Oral Daily   feeding supplement (GLUCERNA SHAKE)  237 mL Oral TID BM   insulin aspart  0-9 Units  Subcutaneous Q4H   insulin glargine-yfgn  8 Units Subcutaneous QHS   mometasone-formoterol  2 puff Inhalation BID   montelukast  10 mg Oral QHS   mupirocin cream   Topical Daily   mouth rinse  15 mL Mouth Rinse 4 times per day   oxyCODONE  5 mg Oral Q6H   pregabalin  25 mg Oral TID    cefTRIAXone (ROCEPHIN)  IV Stopped (06/24/22 2133)   DAPTOmycin (CUBICIN) 500 mg in sodium chloride 0.9 % IVPB Stopped (06/23/22 1758)   heparin 1,450 Units/hr (06/24/22 2223)   sodium bicarbonate 150 mEq in sterile water 1,150 mL infusion 75 mL/hr at 06/25/22 0700   acetaminophen, albuterol, diazepam, hydrALAZINE, HYDROmorphone (DILAUDID) injection, methocarbamol, naLOXone (NARCAN)  injection, ondansetron (ZOFRAN) IV, mouth rinse

## 2022-06-26 ENCOUNTER — Other Ambulatory Visit: Payer: Self-pay | Admitting: Neurosurgery

## 2022-06-26 DIAGNOSIS — M4625 Osteomyelitis of vertebra, thoracolumbar region: Secondary | ICD-10-CM | POA: Diagnosis not present

## 2022-06-26 DIAGNOSIS — N189 Chronic kidney disease, unspecified: Secondary | ICD-10-CM | POA: Diagnosis not present

## 2022-06-26 DIAGNOSIS — N179 Acute kidney failure, unspecified: Secondary | ICD-10-CM | POA: Diagnosis not present

## 2022-06-26 DIAGNOSIS — M869 Osteomyelitis, unspecified: Secondary | ICD-10-CM | POA: Diagnosis not present

## 2022-06-26 DIAGNOSIS — B957 Other staphylococcus as the cause of diseases classified elsewhere: Secondary | ICD-10-CM | POA: Diagnosis not present

## 2022-06-26 LAB — BASIC METABOLIC PANEL
Anion gap: 6 (ref 5–15)
BUN: 45 mg/dL — ABNORMAL HIGH (ref 8–23)
CO2: 20 mmol/L — ABNORMAL LOW (ref 22–32)
Calcium: 8.2 mg/dL — ABNORMAL LOW (ref 8.9–10.3)
Chloride: 114 mmol/L — ABNORMAL HIGH (ref 98–111)
Creatinine, Ser: 3.49 mg/dL — ABNORMAL HIGH (ref 0.44–1.00)
GFR, Estimated: 14 mL/min — ABNORMAL LOW (ref >60)
Glucose, Bld: 97 mg/dL (ref 70–99)
Potassium: 3.4 mmol/L — ABNORMAL LOW (ref 3.5–5.1)
Sodium: 140 mmol/L (ref 135–145)

## 2022-06-26 LAB — HEPARIN LEVEL (UNFRACTIONATED): Heparin Unfractionated: 0.37 IU/mL (ref 0.30–0.70)

## 2022-06-26 LAB — CBC
HCT: 26.3 % — ABNORMAL LOW (ref 36.0–46.0)
Hemoglobin: 8.1 g/dL — ABNORMAL LOW (ref 12.0–15.0)
MCH: 27.4 pg (ref 26.0–34.0)
MCHC: 30.8 g/dL (ref 30.0–36.0)
MCV: 88.9 fL (ref 80.0–100.0)
Platelets: 148 10*3/uL — ABNORMAL LOW (ref 150–400)
RBC: 2.96 MIL/uL — ABNORMAL LOW (ref 3.87–5.11)
RDW: 15.9 % — ABNORMAL HIGH (ref 11.5–15.5)
WBC: 6.6 10*3/uL (ref 4.0–10.5)
nRBC: 0 % (ref 0.0–0.2)

## 2022-06-26 LAB — GLUCOSE, CAPILLARY
Glucose-Capillary: 93 mg/dL (ref 70–99)
Glucose-Capillary: 141 mg/dL — ABNORMAL HIGH (ref 70–99)
Glucose-Capillary: 87 mg/dL (ref 70–99)
Glucose-Capillary: 101 mg/dL — ABNORMAL HIGH (ref 70–99)
Glucose-Capillary: 97 mg/dL (ref 70–99)

## 2022-06-26 MED ORDER — POTASSIUM CHLORIDE CRYS ER 20 MEQ PO TBCR
40.0000 meq | EXTENDED_RELEASE_TABLET | Freq: Once | ORAL | Status: AC
  Administered 2022-06-26: 40 meq via ORAL
  Filled 2022-06-26: qty 2

## 2022-06-26 MED ORDER — IOHEXOL 9 MG/ML PO SOLN
500.0000 mL | ORAL | Status: AC
  Administered 2022-06-26: 500 mL via ORAL

## 2022-06-26 MED ORDER — TAMSULOSIN HCL 0.4 MG PO CAPS
0.4000 mg | ORAL_CAPSULE | Freq: Every day | ORAL | Status: DC
  Administered 2022-06-26 – 2022-07-11 (×16): 0.4 mg via ORAL
  Filled 2022-06-26 (×16): qty 1

## 2022-06-26 MED ORDER — IOHEXOL 9 MG/ML PO SOLN
ORAL | Status: AC
  Filled 2022-06-26: qty 1000

## 2022-06-26 NOTE — Plan of Care (Signed)
No acute events overnight. Problem: Education: Goal: Ability to describe self-care measures that may prevent or decrease complications (Diabetes Survival Skills Education) will improve Outcome: Progressing Goal: Individualized Educational Video(s) Outcome: Progressing   Problem: Coping: Goal: Ability to adjust to condition or change in health will improve Outcome: Progressing   Problem: Fluid Volume: Goal: Ability to maintain a balanced intake and output will improve Outcome: Progressing   Problem: Health Behavior/Discharge Planning: Goal: Ability to identify and utilize available resources and services will improve Outcome: Progressing Goal: Ability to manage health-related needs will improve Outcome: Progressing   Problem: Metabolic: Goal: Ability to maintain appropriate glucose levels will improve Outcome: Progressing   Problem: Nutritional: Goal: Maintenance of adequate nutrition will improve Outcome: Progressing Goal: Progress toward achieving an optimal weight will improve Outcome: Progressing   Problem: Skin Integrity: Goal: Risk for impaired skin integrity will decrease Outcome: Progressing   Problem: Tissue Perfusion: Goal: Adequacy of tissue perfusion will improve Outcome: Progressing   Problem: Education: Goal: Knowledge of General Education information will improve Description: Including pain rating scale, medication(s)/side effects and non-pharmacologic comfort measures Outcome: Progressing   Problem: Health Behavior/Discharge Planning: Goal: Ability to manage health-related needs will improve Outcome: Progressing   Problem: Clinical Measurements: Goal: Ability to maintain clinical measurements within normal limits will improve Outcome: Progressing Goal: Will remain free from infection Outcome: Progressing Goal: Diagnostic test results will improve Outcome: Progressing Goal: Respiratory complications will improve Outcome: Progressing Goal:  Cardiovascular complication will be avoided Outcome: Progressing   Problem: Activity: Goal: Risk for activity intolerance will decrease Outcome: Progressing   Problem: Nutrition: Goal: Adequate nutrition will be maintained Outcome: Progressing   Problem: Coping: Goal: Level of anxiety will decrease Outcome: Progressing   Problem: Elimination: Goal: Will not experience complications related to bowel motility Outcome: Progressing Goal: Will not experience complications related to urinary retention Outcome: Progressing   Problem: Pain Managment: Goal: General experience of comfort will improve Outcome: Progressing   Problem: Safety: Goal: Ability to remain free from injury will improve Outcome: Progressing   Problem: Skin Integrity: Goal: Risk for impaired skin integrity will decrease Outcome: Progressing

## 2022-06-26 NOTE — Progress Notes (Signed)
ANTICOAGULATION CONSULT NOTE  Pharmacy Consult for heparin Indication: hx of DVT/PE  Allergies  Allergen Reactions   Bee Pollen Anaphylaxis   Broccoli [Brassica Oleracea] Anaphylaxis and Hives   Fish-Derived Products Anaphylaxis and Hives   Mushroom Extract Complex Anaphylaxis   Penicillins Anaphylaxis    **Tolerated cefepime March 2021 Did it involve swelling of the face/tongue/throat, SOB, or low BP? Yes Did it involve sudden or severe rash/hives, skin peeling, or any reaction on the inside of your mouth or nose? No Did you need to seek medical attention at a hospital or doctor's office? Yes When did it last happen?  A few months ago If all above answers are "NO", may proceed with cephalosporin use.   Rosemary Oil Anaphylaxis   Shellfish Allergy Anaphylaxis and Hives   Tomato Anaphylaxis and Hives    Raw tomatoes only, can tolerate if cooked   Acetaminophen Other (See Comments)    GI upset   Aloe Vera Hives   Acyclovir And Related Other (See Comments)    Unknown reaction   Naproxen Hives    Patient Measurements: Height: 5' 1"  (154.9 cm) Weight: 84.2 kg (185 lb 10 oz) IBW/kg (Calculated) : 47.8 Heparin Dosing Weight: 65 kg  Vital Signs: Temp: 98 F (36.7 C) (08/28 0601) Temp Source: Oral (08/28 0601) BP: 192/86 (08/28 0601) Pulse Rate: 80 (08/28 0601)  Labs: Recent Labs    06/23/22 1708 06/24/22 0342 06/24/22 0602 06/24/22 1255 06/24/22 2050 06/25/22 0604 06/26/22 0457 06/26/22 0806  HGB  --   --   --   --   --  8.3* 8.1*  --   HCT  --   --   --   --   --  27.1* 26.3*  --   PLT  --   --   --   --   --  137* 148*  --   APTT 29  --   --   --   --   --   --   --   HEPARINUNFRC <0.10*   < >  --    < > 0.29* 0.40  --  0.37  CREATININE  --   --  4.48*  --   --  3.77* 3.49*  --   CKTOTAL  --   --  107  --   --   --   --   --    < > = values in this interval not displayed.    Estimated Creatinine Clearance (by C-G formula based on SCr of 3.49 mg/dL  (H)) Female: 15.6 mL/min (A) Female: 19.2 mL/min (A)   Medications:  PTA Eliquis 2.5 mg BID (last dose: 8/19)(might be taking once daily based on fill records)  Assessment: 85 yoF admitted on 8/22 with possible Discitis/OM, s/p aspiration on 8/25. 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 Pharmacy is consulted to dose Heparin while Eliquis is held for possible surgical procedure this week.  Today, 06/26/22: - Heparin level 0.37, therapeutic on 1,450 units/hr gtt - Hgb 8.1, close to baseline of  - Plt 148, slightly low but trending up now - Per RN no bleeding, line issues, heparin gtt interruptions - Renal function: AKI on CKD with SCr 3.49 today (baseline 2.5-3)  Goal of Therapy:  Heparin level 0.3-0.7 units/ml Monitor platelets by anticoagulation protocol: Yes   Plan:  - Continue heparin gtt at 1,450 units/hr - Daily heparin level - Monitor Hgb, Plts, signs and symptoms of bleeding  Cannon Kettle, PharmD Candidate 06/26/2022 9:46 AM

## 2022-06-26 NOTE — Progress Notes (Signed)
Gave handoff report to receiving nurse Ival Bible at [facility]

## 2022-06-26 NOTE — Progress Notes (Signed)
[facility] Progress Note  Assessment and Plan AKI on CKD IV - b/l creat is 2.6- 3.0 from march 2023, eGFR 16-20 ml/min. F/b Dr Carolin Sicks at [facility]. Creat here is 3.8 on admit in setting of urinary retention, n/v w/ new vertebral discitis/ poss osteomyelitis and poss UTI. UA showed ^wbc's. CT w/o hydronephrosis. Suspected AKI due to vol depletion + urinary retention. Foley placed and pt re-bolused 2 L and IVF"s cont'd. Creat peaked at 4.8 on 8/25, down to 3.5 today. Very good UOP. AKI recovering.   May need urology input for urine retention for voiding trial as outpt.    Nor far off her baseline; start Flomax 0.4. Will follow peripherally.  Metabolic acidosis - due to renal failure. No longer hypovolemic, started on Na bicarb tabs 650 bid.  HTN - bp's were low and now they are high, IVF"s d/c'd,  ^'d coreg to 12.5 bid and added norvasc 5 qd.  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 IV abx per pmd. 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 She is not on any transplant meds, there is no physical scar on the abdomen and no transplant kidney apparent on any of her abdominal CT scans. Told her she did not have a kidney transplant and removed the diagnosis from her medical/ surgical hx.   Subjective: Very good UOP; creat down 3.5 today  Vitals:   06/26/22 0601 06/26/22 0846 06/26/22 1040 06/26/22 1314  BP: (!) 192/86  (!) 155/70 (!) 117/58  Pulse: 80  75 70  Resp: 18   16  Temp: 98 F (36.7 C)   (!) 97.4 F (36.3 C)  TempSrc: Oral   Oral  SpO2: 94% 95%  92%  Weight:      Height:        Exam: Gen alert, elderly pleasant lady on room air No jvd or bruits Chest clear bilat to bases, no rales/ wheezing RRR no MRG Abd soft ntnd no mass or ascites +bs Ext no LE edema Neuro is alert, Ox 3 , nf, no asterixis    Home meds  include - albuterol, carvedilol 6.25 bid, diazepam prn, duloxetine, fluticasone- salmeterol, insulin glargine, montelukast, pregabalin, prns/ vits/ supps       Date                         Creat               eGFR     2009- 2014              0.70- 1.00     2015- 2017              1.30- 1.70     2018- 2019              1.42- 1.90     2020                        1.50- 3.47     2021                        1.40- 7.47     2022  2.11- 8.24     Nov 14, 2021           3.44                 14 ml/min     Feb 2023                 3.96 >> 2.49    14- 21 ml/min     Mar 2023                 2.59- 3.06        16 ml/min     April 2023                3.1- 3.37          14 Mar 2022                3.58- 3.88        10 May 2022                 3.60- 4.35        11     8/22                         3.88                 12     8/23                         4.20     8/24                         3.80     06/23/22                    4.89                 9           UA - 100 prot, >50 wbc, 11-20 rbc, 0-5 epis, many bact     Admitted w/ BP 133/65, today 150/52, lowest BP here 108/49     Got 1 LR in ED on 8/22, then IVF at 125 /hr and now getting IVF 75 cc/hr     I/O = 3.2 L in and 1.5 L out = +1.7 L     Relevant IP meds - coreg 6.25 bid, IV rocephin/ daptomycin, 1 dose IV vanc on 8/23, no contrast      CT abd 8/22 - Urinary Tract: no hydronephrosis. Calcification in the upper right kidney may be nonobstructing stone or vascular. No evidence of focal renal abnormality on this unenhanced exam. Unremarkable urinary bladder.      Recent Labs  Lab 06/23/22 0620 06/24/22 0602 06/25/22 0604 06/26/22 0457  HGB 8.7*  --  8.3* 8.1*  ALBUMIN 2.7* 2.3*  --   --   CALCIUM 8.1* 8.0* 8.1* 8.2*  PHOS  --  5.1*  --   --   CREATININE 4.89* 4.48* 3.77* 3.49*  K 4.6 4.4 3.8 3.4*   No results for input(s): "IRON", "TIBC", "FERRITIN" in the last 168 hours. Inpatient medications:  amLODipine  5  mg Oral Daily   carvedilol  12.5 mg Oral Daily   Chlorhexidine Gluconate Cloth  6 each Topical Daily   DULoxetine  60 mg Oral Daily   feeding supplement (  GLUCERNA SHAKE)  237 mL Oral TID BM   insulin aspart  0-9 Units Subcutaneous Q4H   insulin glargine-yfgn  8 Units Subcutaneous QHS   mometasone-formoterol  2 puff Inhalation BID   montelukast  10 mg Oral QHS   mupirocin cream   Topical Daily   mouth rinse  15 mL Mouth Rinse 4 times per day   pregabalin  25 mg Oral TID   sodium bicarbonate  650 mg Oral BID    cefTRIAXone (ROCEPHIN)  IV 2 g (06/25/22 2048)   DAPTOmycin (CUBICIN) 500 mg in sodium chloride 0.9 % IVPB 500 mg (06/25/22 1707)   heparin 1,450 Units/hr (06/26/22 0508)   acetaminophen, albuterol, diazepam, hydrALAZINE, HYDROmorphone (DILAUDID) injection, methocarbamol, naLOXone (NARCAN)  injection, ondansetron (ZOFRAN) IV, mouth rinse, oxyCODONE

## 2022-06-26 NOTE — Progress Notes (Addendum)
PROGRESS NOTE  [name] [name]  DOB: Mar 05, 1956  PCP: Ann Held, DO ERD:[id number]  DOA: 06/20/2022  LOS: 6 days  Hospital Day: 7  Brief narrative: [name] [name] is a 66 y.o. 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  Patient had back surgery done 2.5 years ago and had screws placed.  For 2 to 3 weeks prior to this presentation, her back pain was worsening.  She also had urinary symptoms..  She has history of multiple infections in the past and at one point on long-term antibiotics until a year ago. 8/22, patient presented to the ED with complaint abdominal pain, nausea, vomiting for 4 days and worsening of chronic back pain. In the ED, WBC count was 10,000, creatinine worsened to 3.8 (baseline 2.5-3), CRP 2, ESR 126 Urinalysis showed WBCs and bacteria CT abdomen pelvis did not show any intra abdominal pathology but showed discitis/osteomyelitis of the T10-T11 area. Admitted to hospitalist service ID, neurosurgery, IR, nephrology consultations were obtained. See below for details  Subjective: Patient was seen and examined this morning. Lying on bed.  Partially controlled pain at the back.  Complains pain medicine not working enough. Noted recommendation from neurosurgery to transferred to [facility] for procedure on 8/30.   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 CT on admission showed discitis/osteomyelitis of T10/T11 Neurosurgery, ID and IR consulted 8/25, patient underwent disc aspiration by IR.  Culture sent. Currently on daptomycin and ceftriaxone per ID Noted  recommendation from neurosurgery to transferred to [facility] for procedure on 8/30.   Pain management Currently on scheduled oral Roxicodone, as needed IV Dilaudid.  Avoid morphine because of significant renal dysfunction.  Continue as needed Robaxin.  UTI Present with abdominal pain  Urinalysis with WBCs and bacteria Urine culture grew Klebsiella pneumoniae Currently improving on IV Rocephin WBC count improved. 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  Creatinine elevated to 3.88 which worsened to peak at 4.89.  Likely due to infection.  No evidence of obstruction on CT scan.  Nephrology consultation was obtained.  Improving last 72 hours, 3.49 today.  IV fluid was stopped yesterday because of concern of shortness of breath Currently has Foley in place for urine output monitoring. 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  Once there is definite plan from neurosurgery for no surgical intervention, we will switch her back to Eliquis.   Patient states that she had 1 incidence of DVT 3 years ago in her left leg.  She did not have recurrence.  She was however maintained on Eliquis.  I presume it is because of her risk of recurrence of  DVT due to bedbound status.  Type 2 diabetes mellitus A1c 4.6 on April 2023 PTA on Lantus 18 units daily. Currently blood sugar level is controlled on lower dose with Lantus 8 units daily along with sliding scale insulin and  Accu-Cheks Continue Lyrica for neuropathy.  Dose adjusted per pharmacy Recent Labs  Lab 06/25/22 2101 06/25/22 2354 06/26/22 0354 06/26/22 0843 06/26/22 1309  GLUCAP 126* 104* 93 141* 51     HFpEF Essential hypertension Liver cirrhosis Patient appears euvolemic at this time Continue Coreg.  Lasix on hold  CAD/MI Hyperlipidemia Does not seem to be in statin.  Acute on chronic anemia No active bleeding but hemoglobin seems to be running lower than baseline.  Continue to monitor. Recent Labs    01/18/22 1250 01/18/22 1251 02/08/22 1247 02/08/22 1248 02/14/22 1248 03/22/22 1300 03/22/22 1301 03/25/22 1911 05/16/22 1304 05/24/22 1314 05/24/22 1315 06/20/22 1813 06/20/22 1822 06/21/22 0448 06/23/22 0620 06/25/22 0604 06/26/22 0457  HGB 7.7*  --  9.1*  --    < > 10.0*  --    < > 8.4* 8.5*  --    < > 11.9* 9.5* 8.7* 8.3* 8.1*  MCV 94.4  --  93.5  --    < > 89.2  --    < > 90.7 92.4  --    < >  --  88.7 90.8 90.0 88.9  FERRITIN 549*  --   --  448*  --  372*  --   --  349* 251  --   --   --   --   --   --   --   TIBC 213*  --  255  --   --  249*  --   --  259 260  --   --   --   --   --   --   --   IRON 46  --  76  --   --  41  --   --  37 34  --   --   --   --   --   --   --   RETICCTPCT  --  3.0 1.6  --   --   --  1.6  --  2.0  --  4.2*  --   --   --   --   --   --    < > = values in this interval not displayed.    Anxiety/depression Cymbalta daily, Valium as needed   Goals of care   Code Status: Full Code    Mobility: Bedbound/wheelchair bound for last more than 2 years  Skin assessment:     Nutritional status:  Body mass index is 35.07 kg/m.  Nutrition Problem: Increased nutrient needs Etiology: acute illness Signs/Symptoms: estimated needs     Diet:  Diet Order             Diet heart healthy/carb modified Room service appropriate? Yes; Fluid consistency: Thin  Diet effective now                   DVT prophylaxis:  SCDs Start:  06/22/22 1037   Antimicrobials: IV Rocephin, IV daptomycin Fluid: IV fluids stopped yesterday because of concern of shortness of breath Consultants: Nephrology, ID, neurosurgery Family Communication: None at bedside  Status is: Inpatient  Continue in-hospital care because: Needs IV antibiotics, inpatient monitoring, may need neurosurgical intervention as well Level of care: Med-Surg   Dispo: The patient is  from: Home              Anticipated d/c is to: Pending clinical course              Patient currently is not medically stable to d/c.   Difficult to place patient No     Infusions:   cefTRIAXone (ROCEPHIN)  IV 2 g (06/25/22 2048)   DAPTOmycin (CUBICIN) 500 mg in sodium chloride 0.9 % IVPB 500 mg (06/25/22 1707)   heparin 1,450 Units/hr (06/26/22 0508)    Scheduled Meds:  amLODipine  5 mg Oral Daily   carvedilol  12.5 mg Oral Daily   Chlorhexidine Gluconate Cloth  6 each Topical Daily   DULoxetine  60 mg Oral Daily   feeding supplement (GLUCERNA SHAKE)  237 mL Oral TID BM   insulin aspart  0-9 Units Subcutaneous Q4H   insulin glargine-yfgn  8 Units Subcutaneous QHS   mometasone-formoterol  2 puff Inhalation BID   montelukast  10 mg Oral QHS   mupirocin cream   Topical Daily   mouth rinse  15 mL Mouth Rinse 4 times per day   pregabalin  25 mg Oral TID   sodium bicarbonate  650 mg Oral BID    PRN meds: acetaminophen, albuterol, diazepam, hydrALAZINE, HYDROmorphone (DILAUDID) injection, methocarbamol, naLOXone (NARCAN)  injection, ondansetron (ZOFRAN) IV, mouth rinse, oxyCODONE   Antimicrobials: Anti-infectives (From admission, onward)    Start     Dose/Rate Route Frequency Ordered Stop   06/23/22 1600  DAPTOmycin (CUBICIN) 500 mg in sodium chloride 0.9 % IVPB        8 mg/kg  59.6 kg (Adjusted) 120 mL/hr over 30 Minutes Intravenous Every 48 hours 06/23/22 1346     06/23/22 1500  DAPTOmycin (CUBICIN) 500 mg in sodium chloride 0.9 % IVPB  Status:  Discontinued         8 mg/kg  59.6 kg (Adjusted) 120 mL/hr over 30 Minutes Intravenous Daily 06/23/22 1339 06/23/22 1346   06/21/22 2200  cefTRIAXone (ROCEPHIN) 2 g in sodium chloride 0.9 % 100 mL IVPB        2 g 200 mL/hr over 30 Minutes Intravenous Every 24 hours 06/21/22 0025     06/21/22 0108  vancomycin variable dose per unstable renal function (pharmacist dosing)  Status:  Discontinued         Does not apply See admin instructions 06/21/22 0108 06/21/22 0906   06/21/22 0045  vancomycin (VANCOREADY) IVPB 1500 mg/300 mL        1,500 mg 150 mL/hr over 120 Minutes Intravenous  Once 06/21/22 0031 06/21/22 0255   06/20/22 2145  cefTRIAXone (ROCEPHIN) 2 g in sodium chloride 0.9 % 100 mL IVPB        2 g 200 mL/hr over 30 Minutes Intravenous  Once 06/20/22 2133 06/20/22 2312       Objective: Vitals:   06/26/22 1040 06/26/22 1314  BP: (!) 155/70 (!) 117/58  Pulse: 75 70  Resp:  16  Temp:  (!) 97.4 F (36.3 C)  SpO2:  92%    Intake/Output Summary (Last 24 hours) at 06/26/2022 1436 Last data filed at 06/26/2022 1100 Gross per 24 hour  Intake 580 ml  Output 2350 ml  Net -1770 ml    Filed Weights   06/24/22 0430 06/25/22 0445 06/26/22 0500  Weight: 83.6 kg 85.5 kg 84.2 kg   Weight change: -1.3 kg Body mass index is 35.07 kg/m.   Physical Exam: General exam: Pleasant, elderly Caucasian female.  Pain partially controlled Skin: No rashes, lesions or ulcers. HEENT: Atraumatic, normocephalic, no obvious bleeding Lungs: Diminished air entry in both bases otherwise clear to auscultation bilaterally CVS: Regular rate and rhythm, no murmur GI/Abd soft, mild tenderness in lower abdomen, bowel sound present CNS: Alert, awake, oriented x3 Psychiatry: Sad affect Extremities: No pedal edema, no calf tenderness  Data Review: I have personally reviewed the laboratory data and studies available.  F/u labs ordered Unresulted Labs (From admission, onward)     Start     Ordered   06/27/22 0500  Heparin  level (unfractionated)  Daily at 5am,   R     Question:  Specimen collection method  Answer:  Lab=Lab collect   06/26/22 1036   06/26/22 0510  Basic metabolic panel  Daily at 5am,   R     Question:  Specimen collection method  Answer:  Lab=Lab collect   06/25/22 1145   06/25/22 0500  CBC  Daily at 5am,   R     Question:  Specimen collection method  Answer:  Lab=Lab collect   06/24/22 1409            Signed, Terrilee Croak, MD [facility] 06/26/2022

## 2022-06-26 NOTE — Progress Notes (Signed)
After [facility] spoke with pt and pt husband she agreed to be transferred tonight to [facility]. [facility] set up for pt's transfer.

## 2022-06-26 NOTE — Progress Notes (Signed)
[facility] for Infectious Disease   Reason for visit: Follow up on discitis/osteomyelitis  Interval History: continued back pain; WBC wnl; plan for surgery on 8/30 Day 7 antibiotics  Physical Exam: Constitutional:  Vitals:   06/26/22 1040 06/26/22 1314  BP: (!) 155/70 (!) 117/58  Pulse: 75 70  Resp:  16  Temp:  (!) 97.4 F (36.3 C)  SpO2:  92%   patient appears in NAD Respiratory: Normal respiratory effort  Review of Systems: Constitutional: negative for fevers and chills  Lab Results  Component Value Date   WBC 6.6 06/26/2022   HGB 8.1 (L) 06/26/2022   HCT 26.3 (L) 06/26/2022   MCV 88.9 06/26/2022   PLT 148 (L) 06/26/2022    Lab Results  Component Value Date   CREATININE 3.49 (H) 06/26/2022   BUN 45 (H) 06/26/2022   NA 140 06/26/2022   K 3.4 (L) 06/26/2022   CL 114 (H) 06/26/2022   CO2 20 (L) 06/26/2022    Lab Results  Component Value Date   ALT 10 06/23/2022   AST 15 06/23/2022   ALKPHOS 81 06/23/2022     Microbiology: Recent Results (from the past 240 hour(s))  Urine Culture     Status: Abnormal   Collection Time: 06/20/22  8:58 PM   Specimen: Urine, Clean Catch  Result Value Ref Range Status   Specimen Description   Final    URINE, CLEAN CATCH Performed at [facility], [place] [address] [place], [place] [address]    Special Requests   Final    NONE Performed at [facility], [place] [address] [place], [place] [address]    Culture >=100,000 COLONIES/mL KLEBSIELLA PNEUMONIAE (A)  Final   Report Status 06/22/2022 FINAL  Final   Organism ID, Bacteria KLEBSIELLA PNEUMONIAE (A)  Final      Susceptibility   Klebsiella pneumoniae - MIC*    AMPICILLIN RESISTANT Resistant     CEFAZOLIN <=4 SENSITIVE Sensitive     CEFEPIME <=0.12 SENSITIVE Sensitive     CEFTRIAXONE <=0.25 SENSITIVE Sensitive     CIPROFLOXACIN <=0.25 SENSITIVE Sensitive     GENTAMICIN <=1 SENSITIVE Sensitive     IMIPENEM <=0.25 SENSITIVE  Sensitive     NITROFURANTOIN <=16 SENSITIVE Sensitive     TRIMETH/SULFA <=20 SENSITIVE Sensitive     AMPICILLIN/SULBACTAM <=2 SENSITIVE Sensitive     PIP/TAZO <=4 SENSITIVE Sensitive     * >=100,000 COLONIES/mL KLEBSIELLA PNEUMONIAE  Aerobic/Anaerobic Culture w Gram Stain (surgical/deep wound)     Status: None (Preliminary result)   Collection Time: 06/23/22 12:56 PM   Specimen: Wound; Other  Result Value Ref Range Status   Specimen Description   Final    WOUND Performed at [place] [address], [place],  [address]    Special Requests   Final    INTERVERTEBRAL T [place] Performed at [facility], [place] [address] [place],  [address]    Gram Stain   Final    FEW WBC PRESENT, PREDOMINANTLY PMN RARE GRAM POSITIVE COCCI IN PAIRS Performed at [facility], [place] [address] [place],  [address]    Culture   Final    FEW STAPHYLOCOCCUS EPIDERMIDIS NO ANAEROBES ISOLATED; CULTURE IN PROGRESS FOR 5 DAYS    Report Status PENDING  Incomplete   Organism ID, Bacteria STAPHYLOCOCCUS EPIDERMIDIS  Final      Susceptibility   Staphylococcus epidermidis - MIC*    CIPROFLOXACIN >=8  RESISTANT Resistant     ERYTHROMYCIN >=8 RESISTANT Resistant     GENTAMICIN <=0.5 SENSITIVE Sensitive     OXACILLIN >=4 RESISTANT Resistant     TETRACYCLINE 2 SENSITIVE Sensitive     VANCOMYCIN 1 SENSITIVE Sensitive     TRIMETH/SULFA 80 RESISTANT Resistant     CLINDAMYCIN >=8 RESISTANT Resistant     RIFAMPIN <=0.5 SENSITIVE Sensitive     Inducible Clindamycin NEGATIVE Sensitive     * FEW STAPHYLOCOCCUS EPIDERMIDIS    Impression/Plan:  1. Discitis/osteomyelitis - she has an extensive infectious history in her back with thoracolumbar osteomyelitis with Pseudomonas on aspirate at that time s/p treatment. She required lumbar fusion and hardware and continued on antibiotics for a prolonged period.  She developed diarrhea and stopped the cipro and  after some delay, restarted the cipro with metronidazole with concern for C diff infection.  She then continued on prolonged cipro, which was later stopped at an unknown time.  Her MRi concerning for worsening infection and now with a positive culture with STaph epidermidis.  Plan for surgical management and will continue with daptomycin.   2.  Acute on chronic kidney disease - followed by nephrology and felt to be dehydration and urinary retention.    3.  UTI - some urinary symptoms and now s/p 6 days ceftriaxone.  Will discontinue.

## 2022-06-26 NOTE — Progress Notes (Signed)
Neurosurgery  In light of likely infection of instrumentation, we will schedule surgery for 8/30 at [facility].  She will need transfer to the hospitalist service here and heparin gtt will need to be held the midnight before surgery. Patient previously cancelled this surgery when it was scheduled last month, but in my discussion with her several days ago, she is now amenable to it.  She is a suboptimal surgical candidate, but it is apparent that without surgery she will have difficulty clearing her infection and would likely experience worsening mechanical back pain.

## 2022-06-26 NOTE — Progress Notes (Signed)
Pt is refusing to be transferred tonight to [facility] until she speaks with her physician and receives approval from her primary regarding her surgery. Notified on call and AC. On Call MD stated that it was inappropriate to notify her regarding the situation.

## 2022-06-27 DIAGNOSIS — M869 Osteomyelitis, unspecified: Secondary | ICD-10-CM | POA: Diagnosis not present

## 2022-06-27 LAB — BASIC METABOLIC PANEL
Anion gap: 6 (ref 5–15)
BUN: 43 mg/dL — ABNORMAL HIGH (ref 8–23)
CO2: 18 mmol/L — ABNORMAL LOW (ref 22–32)
Calcium: 8.5 mg/dL — ABNORMAL LOW (ref 8.9–10.3)
Chloride: 115 mmol/L — ABNORMAL HIGH (ref 98–111)
Creatinine, Ser: 3.55 mg/dL — ABNORMAL HIGH (ref 0.44–1.00)
GFR, Estimated: 14 mL/min — ABNORMAL LOW (ref >60)
Glucose, Bld: 89 mg/dL (ref 70–99)
Potassium: 4.1 mmol/L (ref 3.5–5.1)
Sodium: 139 mmol/L (ref 135–145)

## 2022-06-27 LAB — CBC
HCT: 27 % — ABNORMAL LOW (ref 36.0–46.0)
Hemoglobin: 8.1 g/dL — ABNORMAL LOW (ref 12.0–15.0)
MCH: 27.1 pg (ref 26.0–34.0)
MCHC: 30 g/dL (ref 30.0–36.0)
MCV: 90.3 fL (ref 80.0–100.0)
Platelets: 120 10*3/uL — ABNORMAL LOW (ref 150–400)
RBC: 2.99 MIL/uL — ABNORMAL LOW (ref 3.87–5.11)
RDW: 15.8 % — ABNORMAL HIGH (ref 11.5–15.5)
WBC: 5.5 10*3/uL (ref 4.0–10.5)
nRBC: 0 % (ref 0.0–0.2)

## 2022-06-27 LAB — GLUCOSE, CAPILLARY
Glucose-Capillary: 102 mg/dL — ABNORMAL HIGH (ref 70–99)
Glucose-Capillary: 114 mg/dL — ABNORMAL HIGH (ref 70–99)
Glucose-Capillary: 88 mg/dL (ref 70–99)
Glucose-Capillary: 90 mg/dL (ref 70–99)
Glucose-Capillary: 97 mg/dL (ref 70–99)
Glucose-Capillary: 99 mg/dL (ref 70–99)

## 2022-06-27 LAB — HEPARIN LEVEL (UNFRACTIONATED)
Heparin Unfractionated: 0.14 IU/mL — ABNORMAL LOW (ref 0.30–0.70)
Heparin Unfractionated: 0.12 IU/mL — ABNORMAL LOW (ref 0.30–0.70)
Heparin Unfractionated: 0.37 IU/mL (ref 0.30–0.70)

## 2022-06-27 MED ORDER — SIMETHICONE 80 MG PO CHEW
80.0000 mg | CHEWABLE_TABLET | Freq: Four times a day (QID) | ORAL | Status: DC | PRN
  Administered 2022-06-27: 80 mg via ORAL
  Filled 2022-06-27: qty 1

## 2022-06-27 NOTE — Progress Notes (Signed)
ANTICOAGULATION CONSULT NOTE  Pharmacy Consult for heparin Indication: hx of DVT/PE  Allergies  Allergen Reactions   Bee Pollen Anaphylaxis   Broccoli [Brassica Oleracea] Anaphylaxis and Hives   Fish-Derived Products Anaphylaxis and Hives   Mushroom Extract Complex Anaphylaxis   Penicillins Anaphylaxis    **Tolerated cefepime March 2021 Did it involve swelling of the face/tongue/throat, SOB, or low BP? Yes Did it involve sudden or severe rash/hives, skin peeling, or any reaction on the inside of your mouth or nose? No Did you need to seek medical attention at a hospital or doctor's office? Yes When did it last happen?  A few months ago If all above answers are "NO", may proceed with cephalosporin use.   Rosemary Oil Anaphylaxis   Shellfish Allergy Anaphylaxis and Hives   Tomato Anaphylaxis and Hives    Raw tomatoes only, can tolerate if cooked   Acetaminophen Other (See Comments)    GI upset   Aloe Vera Hives   Acyclovir And Related Other (See Comments)    Unknown reaction   Naproxen Hives    Patient Measurements: Height: 5' 1"  (154.9 cm) Weight: 84.2 kg (185 lb 10 oz) IBW/kg (Calculated) : 47.8 Heparin Dosing Weight: 65 kg  Vital Signs: Temp: 98.1 F (36.7 C) (08/29 1536) Temp Source: Oral (08/29 1536) BP: 154/69 (08/29 1536) Pulse Rate: 73 (08/29 1536)  Labs: Recent Labs    06/25/22 0604 06/26/22 0457 06/26/22 0806 06/27/22 0638 06/27/22 0830 06/27/22 1802  HGB 8.3* 8.1*  --   --  8.1*  --   HCT 27.1* 26.3*  --   --  27.0*  --   PLT 137* 148*  --   --  120*  --   HEPARINUNFRC 0.40  --    < > 0.14* 0.12* 0.37  CREATININE 3.77* 3.49*  --  3.55*  --   --    < > = values in this interval not displayed.     Estimated Creatinine Clearance (by C-G formula based on SCr of 3.55 mg/dL (H)) Female: 15.4 mL/min (A) Female: 18.8 mL/min (A)   Medications:  PTA Eliquis 2.5 mg BID (last dose: 8/19)(might be taking once daily based on fill records)  Assessment: 53  yoF admitted on 8/22 with possible Discitis/OM, s/p aspiration on 8/25. 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 Pharmacy is consulted to dose Heparin while Eliquis is held for possible surgical procedure this week.  PM update 8/29 HL therapeutic at 0.37 no change in rate, to hold heparin at 0100 on 8/30 for surgery.   Goal of Therapy:  Heparin level 0.3-0.7 units/ml Monitor platelets by anticoagulation protocol: Yes   Plan:  Continue heparin infusion at 1850 units/hr Hold heparin at 0100 on 8/30 for surgery F/u [facility] plan post-op Daily heparin level & CBC Monitor for s/s bleeding or thrombosis F/u for ability to resume Eliquis  Susie Pousson A. Levada Dy, PharmD, BCPS, FNKF Clinical Pharmacist [place] Please utilize Amion for appropriate phone number to reach the unit pharmacist ([place])

## 2022-06-27 NOTE — Progress Notes (Signed)
Subjective: Patient reports moderate to severe midline low back pain.  Objective: Vital signs in last 24 hours: Temp:  [97.4 F (36.3 C)-98.1 F (36.7 C)] 98.1 F (36.7 C) (08/29 0403) Pulse Rate:  [70-80] 80 (08/29 0820) Resp:  [16-20] 20 (08/29 0820) BP: (117-154)/(58-67) 154/67 (08/29 0403) SpO2:  [89 %-94 %] 89 % (08/29 0820)  Intake/Output from previous day: 08/28 0701 - 08/29 0700 In: 720 [P.O.:720] Out: 1750 [Urine:1750] Intake/Output this shift: Total I/O In: 240 [P.O.:240] Out: -   Physical Exam: Patient is awake, A/O X 4, conversant, and tearful when discussing her current situation. Eyes open spontaneously. Speech is fluent and appropriate. MAEW. Sensation to light touch is intact. PERLA, EOMI. CNs grossly intact.     Lab Results: Recent Labs    06/26/22 0457 06/27/22 0830  WBC 6.6 5.5  HGB 8.1* 8.1*  HCT 26.3* 27.0*  PLT 148* 120*   BMET Recent Labs    06/26/22 0457 06/27/22 0638  NA 140 139  K 3.4* 4.1  CL 114* 115*  CO2 20* 18*  GLUCOSE 97 89  BUN 45* 43*  CREATININE 3.49* 3.55*  CALCIUM 8.2* 8.5*    Studies/Results: No results found.  Assessment/Plan: 66 yo F with DM, CKD stage 4, hx of DVT/PE, hx of L1 pseudomonas osteomyelitis/fracture s/p L1 corpectomy with reconstruction and T11-L3 perc instrumentation 2.5 years ago who has instrumentation failure, possible T10-11 infection. She has arrived to [facility] and is ready to proceed with surgery tomorrow. Neurological exam is stable. She continues to have moderate to severe midline low back pain.  -NPO at midnight -Surgery scheduled with Dr. Marcello Moores tomorrow  07/29/2022  LOS: 7 days     Marvis Moeller 06/27/2022, 12:23 PM

## 2022-06-27 NOTE — Progress Notes (Signed)
PROGRESS NOTE  [name] [name]  DOB: [birth year]  PCP: Ann Held, DO MWU:[id number]  DOA: 06/20/2022  LOS: 7 days  Hospital Day: 8  Brief narrative: [name] [name] is a 66 y.o. adult with PMH significant for obesity, OSA, DM2, HTN, HLD, CAD/MI, CHF, CKD4, DVT on Eliquis, liver cirrhosis, chronic anemia, history of L1 osteomyelitis status post L1 corpectomy and prolonged antibiotics, chronically bedbound.  Patient had back surgery done 2.5 years ago and had screws placed.  For 2 to 3 weeks prior to this presentation, her back pain was worsening.  She also had urinary symptoms..  She has history of multiple infections in the past and at one point on long-term antibiotics until a year ago. 8/22, patient presented to the ED with complaint abdominal pain, nausea, vomiting for 4 days and worsening of chronic back pain. In the ED, WBC count was 10,000, creatinine worsened to 3.8 (baseline 2.5-3), CRP 2, ESR 126 Urinalysis showed WBCs and bacteria CT abdomen pelvis did not show any intra abdominal pathology but showed discitis/osteomyelitis of the T10-T11 area. Admitted to hospitalist service ID, neurosurgery, IR, nephrology consultations were obtained. See below for details  Subjective: Patient was seen and examined this morning. Lying on bed.  Not in distress.  She was reluctant for transport to [facility] but understands the need to undergo surgery.  Patient agreed reluctantly.  Neurosurgery plans to operate on 8/30.  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 CT on admission showed discitis/osteomyelitis of T10/T11 Neurosurgery, ID and IR consulted 8/25, patient underwent disc aspiration by IR.   Currently on daptomycin and ceftriaxone per ID.  Her pain level does not seem to be improving however Noted recommendation from neurosurgery to transferred to [facility]  for procedure on 8/30.   Pain management Currently on scheduled oral Roxicodone, as needed IV Dilaudid.  Avoid morphine because of significant renal dysfunction.  Continue as needed Robaxin.  UTI Present with abdominal pain  Urinalysis with WBCs and bacteria Urine culture grew Klebsiella pneumoniae Currently improving on IV Rocephin WBC count improved. Recent Labs  Lab 06/21/22 0448 06/23/22 0620 06/25/22 0604 06/26/22 0457 06/27/22 0830  WBC 9.9 6.5 6.1 6.6 5.5   Acute kidney injury on CKD stage IV Baseline creatinine 2.6-3.  Creatinine elevated to 3.88 which worsened to peak at 4.89.  Likely due to infection.  No evidence of obstruction on CT scan.  Nephrology consultation was obtained.  Improving last 72 hours, 3.49 today.  IV fluid was stopped yesterday because of concern of shortness of breath Currently has Foley in place for urine output monitoring. Previous charts documented that patient had a history of renal transplant several years ago.  But per nephrology, patient never had renal transplant.  She is not on any immunosuppressants.  She does not have a scar in the abdomen to suggest transplant.   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  Once there is definite plan from neurosurgery for no surgical intervention, we will switch her back to Eliquis.   Patient states that she had 1 incidence of DVT 3 years ago in her left  leg.  She did not have recurrence.  She was however maintained on Eliquis.  I presume it is because of her risk of recurrence of DVT due to bedbound status.  Type 2 diabetes mellitus A1c 4.6 on April 2023 PTA on Lantus 18 units daily. Currently blood sugar level is  controlled on lower dose with Lantus 8 units daily along with sliding scale insulin and Accu-Cheks Continue Lyrica for neuropathy.  Dose adjusted per pharmacy Recent Labs  Lab 06/26/22 2133 06/27/22 0019 06/27/22 0359 06/27/22 0717 06/27/22 1210  GLUCAP 97 102* 88 90 99    HFpEF Essential hypertension Liver cirrhosis Patient appears euvolemic at this time Continue Coreg.  Lasix on hold  CAD/MI Hyperlipidemia Does not seem to be in statin.  Acute on chronic anemia No active bleeding but hemoglobin seems to be running lower than baseline.  Continue to monitor. Recent Labs    01/18/22 1250 01/18/22 1251 02/08/22 1247 02/08/22 1248 02/14/22 1248 03/22/22 1300 03/22/22 1301 03/25/22 1911 05/16/22 1304 05/24/22 1314 05/24/22 1315 06/20/22 1813 06/21/22 0448 06/23/22 0620 06/25/22 0604 06/26/22 0457 06/27/22 0830  HGB 7.7*  --  9.1*  --    < > 10.0*  --    < > 8.4* 8.5*  --    < > 9.5* 8.7* 8.3* 8.1* 8.1*  MCV 94.4  --  93.5  --    < > 89.2  --    < > 90.7 92.4  --    < > 88.7 90.8 90.0 88.9 90.3  FERRITIN 549*  --   --  448*  --  372*  --   --  349* 251  --   --   --   --   --   --   --   TIBC 213*  --  255  --   --  249*  --   --  259 260  --   --   --   --   --   --   --   IRON 46  --  76  --   --  41  --   --  37 34  --   --   --   --   --   --   --   RETICCTPCT  --  3.0 1.6  --   --   --  1.6  --  2.0  --  4.2*  --   --   --   --   --   --    < > = values in this interval not displayed.   Anxiety/depression Cymbalta daily, Valium as needed   Goals of care   Code Status: Full Code    Mobility: Bedbound/wheelchair bound for last more than 2 years  Skin assessment:     Nutritional status:  Body mass index is 35.07 kg/m.  Nutrition Problem: Increased nutrient needs Etiology: acute illness Signs/Symptoms: estimated needs     Diet:  Diet Order             Diet NPO time specified  Diet effective midnight           Diet heart healthy/carb modified  Room service appropriate? Yes; Fluid consistency: Thin  Diet effective now                   DVT prophylaxis:  SCDs Start: 06/22/22 1037   Antimicrobials: IV Rocephin, IV daptomycin Fluid: IV fluids stopped yesterday because of concern of shortness of  breath Consultants: Nephrology, ID, neurosurgery Family Communication: None at bedside  Status is: Inpatient  Continue in-hospital care because: Neurosurgical procedure on 8/30.   Level of care: Telemetry Medical   Dispo: The patient is from: Home              Anticipated d/c is to: Pending clinical course              Patient currently is not medically stable to d/c.   Difficult to place patient No     Infusions:   DAPTOmycin (CUBICIN) 500 mg in sodium chloride 0.9 % IVPB 500 mg (06/25/22 1707)   heparin 1,850 Units/hr (06/27/22 1005)    Scheduled Meds:  amLODipine  5 mg Oral Daily   carvedilol  12.5 mg Oral Daily   Chlorhexidine Gluconate Cloth  6 each Topical Daily   DULoxetine  60 mg Oral Daily   feeding supplement (GLUCERNA SHAKE)  237 mL Oral TID BM   insulin aspart  0-9 Units Subcutaneous Q4H   insulin glargine-yfgn  8 Units Subcutaneous QHS   mometasone-formoterol  2 puff Inhalation BID   montelukast  10 mg Oral QHS   mupirocin cream   Topical Daily   mouth rinse  15 mL Mouth Rinse 4 times per day   pregabalin  25 mg Oral TID   sodium bicarbonate  650 mg Oral BID   tamsulosin  0.4 mg Oral Daily    PRN meds: acetaminophen, albuterol, diazepam, hydrALAZINE, HYDROmorphone (DILAUDID) injection, methocarbamol, naLOXone (NARCAN)  injection, ondansetron (ZOFRAN) IV, mouth rinse, oxyCODONE, simethicone   Antimicrobials: Anti-infectives (From admission, onward)    Start     Dose/Rate Route Frequency Ordered Stop   06/23/22 1600  DAPTOmycin (CUBICIN) 500 mg in sodium chloride 0.9 % IVPB        8 mg/kg  59.6 kg (Adjusted) 120 mL/hr over 30 Minutes Intravenous Every 48 hours 06/23/22 1346     06/23/22 1500   DAPTOmycin (CUBICIN) 500 mg in sodium chloride 0.9 % IVPB  Status:  Discontinued        8 mg/kg  59.6 kg (Adjusted) 120 mL/hr over 30 Minutes Intravenous Daily 06/23/22 1339 06/23/22 1346   06/21/22 2200  cefTRIAXone (ROCEPHIN) 2 g in sodium chloride 0.9 % 100 mL IVPB  Status:  Discontinued        2 g 200 mL/hr over 30 Minutes Intravenous Every 24 hours 06/21/22 0025 06/26/22 1454   06/21/22 0108  vancomycin variable dose per unstable renal function (pharmacist dosing)  Status:  Discontinued         Does not apply See admin instructions 06/21/22 0108 06/21/22 0906   06/21/22 0045  vancomycin (VANCOREADY) IVPB 1500 mg/300 mL        1,500 mg 150 mL/hr over 120 Minutes Intravenous  Once 06/21/22 0031 06/21/22 0255   06/20/22 2145  cefTRIAXone (ROCEPHIN) 2 g in sodium chloride 0.9 % 100 mL IVPB        2 g 200 mL/hr over 30 Minutes Intravenous  Once 06/20/22 2133 06/20/22 2312       Objective: Vitals:   06/27/22 0820 06/27/22 1536  BP:  (!) 154/69  Pulse: 80 73  Resp: 20 20  Temp:  98.1 F (36.7 C)  SpO2: (!) 89%     Intake/Output Summary (Last 24 hours) at 06/27/2022 1559 Last data filed at 06/27/2022 1000 Gross per 24 hour  Intake 240 ml  Output 750 ml  Net -510 ml   [employer]  06/24/22 0430 06/25/22 0445 06/26/22 0500  Weight: 83.6 kg 85.5 kg 84.2 kg   Weight change:  Body mass index is 35.07 kg/m.   Physical Exam: General exam: Pleasant, elderly Caucasian female.  Pain partially controlled Skin: No rashes, lesions or ulcers. HEENT: Atraumatic, normocephalic, no obvious bleeding Lungs: Diminished air entry in both bases otherwise clear to auscultation bilaterally CVS: Regular rate and rhythm, no murmur GI/Abd soft, mild tenderness in lower abdomen, bowel sound present CNS: Alert, awake, oriented x3 Psychiatry: Sad affect Extremities: No pedal edema, no calf tenderness  Data Review: I have personally reviewed the laboratory data and studies available.  F/u  labs ordered Unresulted Labs (From admission, onward)     Start     Ordered   07/01/22 0500  CK  Weekly,   R     Question:  Specimen collection method  Answer:  Lab=Lab collect   06/27/22 0952   06/27/22 1800  Heparin level (unfractionated)  Once-Timed,   TIMED       Question:  Specimen collection method  Answer:  Lab=Lab collect   06/27/22 0946   06/27/22 0500  Heparin level (unfractionated)  Daily at 5am,   R     Question:  Specimen collection method  Answer:  Lab=Lab collect   06/26/22 1036   06/26/22 8889  Basic metabolic panel  Daily at 5am,   R     Question:  Specimen collection method  Answer:  Lab=Lab collect   06/25/22 1145   06/25/22 0500  CBC  Daily at 5am,   R     Question:  Specimen collection method  Answer:  Lab=Lab collect   06/24/22 1409            Signed, Terrilee Croak, MD [facility] 06/27/2022

## 2022-06-27 NOTE — Progress Notes (Signed)
ANTICOAGULATION CONSULT NOTE  Pharmacy Consult for heparin Indication: hx of DVT/PE  Allergies  Allergen Reactions   Bee Pollen Anaphylaxis   Broccoli [Brassica Oleracea] Anaphylaxis and Hives   Fish-Derived Products Anaphylaxis and Hives   Mushroom Extract Complex Anaphylaxis   Penicillins Anaphylaxis    **Tolerated cefepime March 2021 Did it involve swelling of the face/tongue/throat, SOB, or low BP? Yes Did it involve sudden or severe rash/hives, skin peeling, or any reaction on the inside of your mouth or nose? No Did you need to seek medical attention at a hospital or doctor's office? Yes When did it last happen?  A few months ago If all above answers are "NO", may proceed with cephalosporin use.   Rosemary Oil Anaphylaxis   Shellfish Allergy Anaphylaxis and Hives   Tomato Anaphylaxis and Hives    Raw tomatoes only, can tolerate if cooked   Acetaminophen Other (See Comments)    GI upset   Aloe Vera Hives   Acyclovir And Related Other (See Comments)    Unknown reaction   Naproxen Hives    Patient Measurements: Height: 5' 1"  (154.9 cm) Weight: 84.2 kg (185 lb 10 oz) IBW/kg (Calculated) : 47.8 Heparin Dosing Weight: 65 kg  Vital Signs: Temp: 98.1 F (36.7 C) (08/29 0403) BP: 154/67 (08/29 0403) Pulse Rate: 80 (08/29 0820)  Labs: Recent Labs    06/25/22 0604 06/26/22 0457 06/26/22 0806 06/27/22 0638 06/27/22 0830  HGB 8.3* 8.1*  --   --  8.1*  HCT 27.1* 26.3*  --   --  27.0*  PLT 137* 148*  --   --  120*  HEPARINUNFRC 0.40  --  0.37 0.14* 0.12*  CREATININE 3.77* 3.49*  --  3.55*  --     Estimated Creatinine Clearance (by C-G formula based on SCr of 3.55 mg/dL (H)) Female: 15.4 mL/min (A) Female: 18.8 mL/min (A)   Medications:  PTA Eliquis 2.5 mg BID (last dose: 8/19)(might be taking once daily based on fill records)  Assessment: 13 yoF admitted on 8/22 with possible Discitis/OM, s/p aspiration on 8/25. PMH is significant for chronic DVT and history of  PE on Eliquis PTA. Pharmacy is consulted to dose Heparin while Eliquis is held for possible surgical procedure this week.  Today, 06/26/22: Heparin level dropped to SUBtherapeutic this AM on 1,450 units/hr; previously had been stable at ~0.4 units/ml Repeat level also low Hgb low but stable (low at baseline); Plt slightly low but trending up now Per RN no bleeding, line issues, heparin gtt interruptions Renal function: AKI on CKD with SCr 3.49 today (baseline 2.5-3)  Goal of Therapy:  Heparin level 0.3-0.7 units/ml Monitor platelets by anticoagulation protocol: Yes   Plan:  Increase heparin infusion to 1850 units/hr Recheck 8-hr heparin level Daily heparin level & CBC Monitor for s/s bleeding or thrombosis F/u for ability to resume Eliquis  Reuel Boom, PharmD, BCPS [phone] 06/27/2022, 9:41 AM

## 2022-06-28 ENCOUNTER — Inpatient Hospital Stay (HOSPITAL_COMMUNITY): Payer: PRIVATE HEALTH INSURANCE

## 2022-06-28 ENCOUNTER — Inpatient Hospital Stay: Admit: 2022-06-28 | Payer: Medicare Other

## 2022-06-28 ENCOUNTER — Other Ambulatory Visit: Payer: Self-pay

## 2022-06-28 ENCOUNTER — Inpatient Hospital Stay (HOSPITAL_COMMUNITY)
Admission: EM | Disposition: A | Discharge status: Home Health | Payer: Self-pay | Source: Home / Self Care | Attending: Family Medicine

## 2022-06-28 ENCOUNTER — Encounter (HOSPITAL_BASED_OUTPATIENT_CLINIC_OR_DEPARTMENT_OTHER): Payer: PRIVATE HEALTH INSURANCE | Attending: General Surgery | Admitting: General Surgery

## 2022-06-28 ENCOUNTER — Encounter (HOSPITAL_COMMUNITY): Payer: Self-pay | Admitting: Internal Medicine

## 2022-06-28 DIAGNOSIS — N179 Acute kidney failure, unspecified: Secondary | ICD-10-CM | POA: Diagnosis not present

## 2022-06-28 DIAGNOSIS — E1122 Type 2 diabetes mellitus with diabetic chronic kidney disease: Secondary | ICD-10-CM

## 2022-06-28 DIAGNOSIS — D631 Anemia in chronic kidney disease: Secondary | ICD-10-CM

## 2022-06-28 DIAGNOSIS — M4644 Discitis, unspecified, thoracic region: Secondary | ICD-10-CM

## 2022-06-28 DIAGNOSIS — M40204 Unspecified kyphosis, thoracic region: Secondary | ICD-10-CM | POA: Diagnosis not present

## 2022-06-28 DIAGNOSIS — M4624 Osteomyelitis of vertebra, thoracic region: Secondary | ICD-10-CM

## 2022-06-28 DIAGNOSIS — M8618 Other acute osteomyelitis, other site: Secondary | ICD-10-CM

## 2022-06-28 DIAGNOSIS — N189 Chronic kidney disease, unspecified: Secondary | ICD-10-CM

## 2022-06-28 DIAGNOSIS — I129 Hypertensive chronic kidney disease with stage 1 through stage 4 chronic kidney disease, or unspecified chronic kidney disease: Secondary | ICD-10-CM

## 2022-06-28 DIAGNOSIS — I2510 Atherosclerotic heart disease of native coronary artery without angina pectoris: Secondary | ICD-10-CM | POA: Diagnosis not present

## 2022-06-28 DIAGNOSIS — N3000 Acute cystitis without hematuria: Secondary | ICD-10-CM | POA: Diagnosis not present

## 2022-06-28 DIAGNOSIS — N185 Chronic kidney disease, stage 5: Secondary | ICD-10-CM | POA: Diagnosis not present

## 2022-06-28 LAB — BASIC METABOLIC PANEL
Anion gap: 8 (ref 5–15)
BUN: 42 mg/dL — ABNORMAL HIGH (ref 8–23)
CO2: 20 mmol/L — ABNORMAL LOW (ref 22–32)
Calcium: 8.4 mg/dL — ABNORMAL LOW (ref 8.9–10.3)
Chloride: 113 mmol/L — ABNORMAL HIGH (ref 98–111)
Creatinine, Ser: 3.94 mg/dL — ABNORMAL HIGH (ref 0.44–1.00)
GFR, Estimated: 12 mL/min — ABNORMAL LOW (ref >60)
Glucose, Bld: 105 mg/dL — ABNORMAL HIGH (ref 70–99)
Potassium: 4.2 mmol/L (ref 3.5–5.1)
Sodium: 141 mmol/L (ref 135–145)

## 2022-06-28 LAB — CBC
HCT: 25.5 % — ABNORMAL LOW (ref 36.0–46.0)
Hemoglobin: 8 g/dL — ABNORMAL LOW (ref 12.0–15.0)
MCH: 27.8 pg (ref 26.0–34.0)
MCHC: 31.4 g/dL (ref 30.0–36.0)
MCV: 88.5 fL (ref 80.0–100.0)
Platelets: 130 10*3/uL — ABNORMAL LOW (ref 150–400)
RBC: 2.88 MIL/uL — ABNORMAL LOW (ref 3.87–5.11)
RDW: 15.8 % — ABNORMAL HIGH (ref 11.5–15.5)
WBC: 5.2 10*3/uL (ref 4.0–10.5)
nRBC: 0 % (ref 0.0–0.2)

## 2022-06-28 LAB — GLUCOSE, CAPILLARY
Glucose-Capillary: 100 mg/dL — ABNORMAL HIGH (ref 70–99)
Glucose-Capillary: 118 mg/dL — ABNORMAL HIGH (ref 70–99)
Glucose-Capillary: 131 mg/dL — ABNORMAL HIGH (ref 70–99)
Glucose-Capillary: 166 mg/dL — ABNORMAL HIGH (ref 70–99)
Glucose-Capillary: 170 mg/dL — ABNORMAL HIGH (ref 70–99)
Glucose-Capillary: 82 mg/dL (ref 70–99)
Glucose-Capillary: 82 mg/dL (ref 70–99)
Glucose-Capillary: 84 mg/dL (ref 70–99)
Glucose-Capillary: 90 mg/dL (ref 70–99)
Glucose-Capillary: 92 mg/dL (ref 70–99)

## 2022-06-28 LAB — POCT I-STAT 7, (LYTES, BLD GAS, ICA,H+H)
Acid-base deficit: 6 mmol/L — ABNORMAL HIGH (ref 0.0–2.0)
Bicarbonate: 20.3 mmol/L (ref 20.0–28.0)
Calcium, Ion: 1.2 mmol/L (ref 1.15–1.40)
HCT: 25 % — ABNORMAL LOW (ref 36.0–46.0)
Hemoglobin: 8.5 g/dL — ABNORMAL LOW (ref 12.0–15.0)
O2 Saturation: 94 %
Patient temperature: 35.7
Potassium: 4.5 mmol/L (ref 3.5–5.1)
Sodium: 141 mmol/L (ref 135–145)
TCO2: 22 mmol/L (ref 22–32)
pCO2 arterial: 41.7 mmHg (ref 32–48)
pH, Arterial: 7.289 — ABNORMAL LOW (ref 7.35–7.45)
pO2, Arterial: 76 mmHg — ABNORMAL LOW (ref 83–108)

## 2022-06-28 LAB — PREPARE RBC (CROSSMATCH)

## 2022-06-28 LAB — SURGICAL PCR SCREEN
MRSA, PCR: NEGATIVE
Staphylococcus aureus: POSITIVE — AB

## 2022-06-28 LAB — HEPARIN LEVEL (UNFRACTIONATED): Heparin Unfractionated: <0.1 IU/mL — ABNORMAL LOW (ref 0.30–0.70)

## 2022-06-28 SURGERY — POSTERIOR LUMBAR FUSION 2 LEVEL
Anesthesia: General

## 2022-06-28 MED ORDER — ONDANSETRON HCL 4 MG/2ML IJ SOLN
INTRAMUSCULAR | Status: DC | PRN
  Administered 2022-06-28: 4 mg via INTRAVENOUS

## 2022-06-28 MED ORDER — ROCURONIUM BROMIDE 10 MG/ML (PF) SYRINGE
PREFILLED_SYRINGE | INTRAVENOUS | Status: DC | PRN
  Administered 2022-06-28: 60 mg via INTRAVENOUS

## 2022-06-28 MED ORDER — ADULT MULTIVITAMIN W/MINERALS CH
1.0000 | ORAL_TABLET | Freq: Every day | ORAL | Status: DC
  Administered 2022-06-29 – 2022-07-11 (×13): 1 via ORAL
  Filled 2022-06-28 (×13): qty 1

## 2022-06-28 MED ORDER — ONDANSETRON HCL 4 MG/2ML IJ SOLN
INTRAMUSCULAR | Status: AC
  Filled 2022-06-28: qty 2

## 2022-06-28 MED ORDER — THROMBIN 5000 UNITS EX SOLR
CUTANEOUS | Status: AC
  Filled 2022-06-28: qty 5000

## 2022-06-28 MED ORDER — POTASSIUM CHLORIDE IN NACL 20-0.9 MEQ/L-% IV SOLN
INTRAVENOUS | Status: DC
Start: 2022-06-28 — End: 2022-06-29

## 2022-06-28 MED ORDER — ACETAMINOPHEN 650 MG RE SUPP: 650.0000 mg | RECTAL | Status: DC | PRN

## 2022-06-28 MED ORDER — APIXABAN 2.5 MG PO TABS
2.5000 mg | ORAL_TABLET | Freq: Two times a day (BID) | ORAL | Status: DC
  Administered 2022-07-03: 2.5 mg via ORAL
  Filled 2022-06-28: qty 1

## 2022-06-28 MED ORDER — DOCUSATE SODIUM 100 MG PO CAPS
100.0000 mg | ORAL_CAPSULE | Freq: Two times a day (BID) | ORAL | Status: DC
  Administered 2022-06-29 – 2022-07-01 (×5): 100 mg via ORAL
  Filled 2022-06-28 (×8): qty 1

## 2022-06-28 MED ORDER — PROPOFOL 10 MG/ML IV BOLUS
INTRAVENOUS | Status: DC | PRN
  Administered 2022-06-28: 170 mg via INTRAVENOUS

## 2022-06-28 MED ORDER — 0.9 % SODIUM CHLORIDE (POUR BTL) OPTIME
TOPICAL | Status: DC | PRN
  Administered 2022-06-28 (×2): 1000 mL

## 2022-06-28 MED ORDER — SODIUM CHLORIDE 0.9 % IV SOLN: INTRAVENOUS | Status: DC | PRN

## 2022-06-28 MED ORDER — BUPIVACAINE HCL (PF) 0.5 % IJ SOLN
INTRAMUSCULAR | Status: AC
  Filled 2022-06-28: qty 30

## 2022-06-28 MED ORDER — PHENYLEPHRINE HCL-NACL 20-0.9 MG/250ML-% IV SOLN
INTRAVENOUS | Status: DC | PRN
  Administered 2022-06-28: 20 ug/min via INTRAVENOUS

## 2022-06-28 MED ORDER — SODIUM CHLORIDE 0.9% IV SOLUTION: Freq: Once | INTRAVENOUS | Status: DC

## 2022-06-28 MED ORDER — CLINDAMYCIN PHOSPHATE 900 MG/50ML IV SOLN
INTRAVENOUS | Status: AC
  Filled 2022-06-28: qty 50

## 2022-06-28 MED ORDER — CHLORHEXIDINE GLUCONATE 0.12 % MT SOLN
OROMUCOSAL | Status: AC
  Filled 2022-06-28: qty 15

## 2022-06-28 MED ORDER — SODIUM CHLORIDE 0.9% FLUSH
3.0000 mL | INTRAVENOUS | Status: DC | PRN
  Administered 2022-06-28 – 2022-06-30 (×2): 3 mL via INTRAVENOUS

## 2022-06-28 MED ORDER — EPHEDRINE SULFATE (PRESSORS) 50 MG/ML IJ SOLN
INTRAMUSCULAR | Status: DC | PRN
  Administered 2022-06-28 (×4): 5 mg via INTRAVENOUS

## 2022-06-28 MED ORDER — THROMBIN 5000 UNITS EX SOLR
OROMUCOSAL | Status: DC | PRN
  Administered 2022-06-28 (×2): 5 mL via TOPICAL

## 2022-06-28 MED ORDER — ONDANSETRON HCL 4 MG PO TABS
4.0000 mg | ORAL_TABLET | Freq: Four times a day (QID) | ORAL | Status: DC | PRN
  Administered 2022-07-06: 4 mg via ORAL
  Filled 2022-06-28: qty 1

## 2022-06-28 MED ORDER — BUPIVACAINE HCL (PF) 0.5 % IJ SOLN
INTRAMUSCULAR | Status: DC | PRN
  Administered 2022-06-28: 30 mL

## 2022-06-28 MED ORDER — ENOXAPARIN SODIUM 40 MG/0.4ML IJ SOSY
40.0000 mg | PREFILLED_SYRINGE | INTRAMUSCULAR | Status: DC
  Administered 2022-06-29 – 2022-06-30 (×2): 40 mg via SUBCUTANEOUS
  Filled 2022-06-28 (×2): qty 0.4

## 2022-06-28 MED ORDER — OXYCODONE HCL 5 MG PO TABS: 5.0000 mg | ORAL_TABLET | Freq: Once | ORAL | Status: DC | PRN

## 2022-06-28 MED ORDER — ONDANSETRON HCL 4 MG/2ML IJ SOLN
4.0000 mg | Freq: Once | INTRAMUSCULAR | Status: AC | PRN
Start: 2022-06-28 — End: 2022-06-28
  Administered 2022-06-28: 4 mg via INTRAVENOUS

## 2022-06-28 MED ORDER — ONDANSETRON HCL 4 MG/2ML IJ SOLN
4.0000 mg | Freq: Four times a day (QID) | INTRAMUSCULAR | Status: DC | PRN
Start: 2022-06-28 — End: 2022-07-11
  Administered 2022-06-28 – 2022-07-08 (×7): 4 mg via INTRAVENOUS
  Filled 2022-06-28 (×7): qty 2

## 2022-06-28 MED ORDER — OXYCODONE HCL 5 MG/5ML PO SOLN: 5.0000 mg | Freq: Once | ORAL | Status: DC | PRN

## 2022-06-28 MED ORDER — FUROSEMIDE 40 MG PO TABS
40.0000 mg | ORAL_TABLET | Freq: Every day | ORAL | Status: DC
Start: 2022-06-28 — End: 2022-07-03
  Administered 2022-06-29 – 2022-07-03 (×5): 40 mg via ORAL
  Filled 2022-06-28 (×5): qty 1

## 2022-06-28 MED ORDER — OXYCODONE HCL 5 MG PO TABS
5.0000 mg | ORAL_TABLET | ORAL | Status: DC | PRN
  Administered 2022-07-10: 5 mg via ORAL
  Filled 2022-06-28 (×3): qty 1

## 2022-06-28 MED ORDER — LIDOCAINE-EPINEPHRINE 1 %-1:100000 IJ SOLN
INTRAMUSCULAR | Status: DC | PRN
  Administered 2022-06-28: 10 mL

## 2022-06-28 MED ORDER — SUGAMMADEX SODIUM 200 MG/2ML IV SOLN
INTRAVENOUS | Status: DC | PRN
  Administered 2022-06-28: 100 mg via INTRAVENOUS

## 2022-06-28 MED ORDER — BUPIVACAINE LIPOSOME 1.3 % IJ SUSP
INTRAMUSCULAR | Status: AC
  Filled 2022-06-28: qty 20

## 2022-06-28 MED ORDER — PROPOFOL 10 MG/ML IV BOLUS
INTRAVENOUS | Status: AC
  Filled 2022-06-28: qty 20

## 2022-06-28 MED ORDER — OXYCODONE HCL 5 MG PO TABS
10.0000 mg | ORAL_TABLET | ORAL | Status: DC | PRN
  Administered 2022-06-28 – 2022-07-11 (×37): 10 mg via ORAL
  Filled 2022-06-28 (×36): qty 2

## 2022-06-28 MED ORDER — VANCOMYCIN HCL 1000 MG IV SOLR
INTRAVENOUS | Status: AC
  Filled 2022-06-28: qty 20

## 2022-06-28 MED ORDER — VITAMIN D 25 MCG (1000 UNIT) PO TABS
5000.0000 [IU] | ORAL_TABLET | Freq: Every day | ORAL | Status: DC
  Administered 2022-06-29 – 2022-07-11 (×13): 5000 [IU] via ORAL
  Filled 2022-06-28 (×13): qty 5

## 2022-06-28 MED ORDER — FLEET ENEMA 7-19 GM/118ML RE ENEM: 1.0000 | ENEMA | Freq: Once | RECTAL | Status: DC | PRN

## 2022-06-28 MED ORDER — FENTANYL CITRATE (PF) 250 MCG/5ML IJ SOLN
INTRAMUSCULAR | Status: DC | PRN
Start: 2022-06-28 — End: 2022-06-28
  Administered 2022-06-28 (×2): 50 ug via INTRAVENOUS

## 2022-06-28 MED ORDER — ACETAMINOPHEN 325 MG PO TABS
650.0000 mg | ORAL_TABLET | ORAL | Status: DC | PRN
  Administered 2022-07-02: 650 mg via ORAL
  Filled 2022-06-28: qty 2

## 2022-06-28 MED ORDER — FENTANYL CITRATE (PF) 100 MCG/2ML IJ SOLN
INTRAMUSCULAR | Status: AC
  Filled 2022-06-28: qty 2

## 2022-06-28 MED ORDER — SODIUM CHLORIDE (PF) 0.9 % IJ SOLN
INTRAMUSCULAR | Status: DC | PRN
  Administered 2022-06-28: 10 mL

## 2022-06-28 MED ORDER — FOLIC ACID 1 MG PO TABS
2.0000 mg | ORAL_TABLET | Freq: Every day | ORAL | Status: DC
  Administered 2022-06-29 – 2022-07-11 (×13): 2 mg via ORAL
  Filled 2022-06-28 (×13): qty 2

## 2022-06-28 MED ORDER — VANCOMYCIN HCL 1000 MG IV SOLR
INTRAVENOUS | Status: DC | PRN
  Administered 2022-06-28: 1000 mg
  Administered 2022-06-28: 1000 mg via TOPICAL

## 2022-06-28 MED ORDER — FENTANYL CITRATE (PF) 100 MCG/2ML IJ SOLN
25.0000 ug | INTRAMUSCULAR | Status: DC | PRN
  Administered 2022-06-28: 25 ug via INTRAVENOUS

## 2022-06-28 MED ORDER — LIDOCAINE 2% (20 MG/ML) 5 ML SYRINGE
INTRAMUSCULAR | Status: DC | PRN
  Administered 2022-06-28: 60 mg via INTRAVENOUS

## 2022-06-28 MED ORDER — DEXAMETHASONE SODIUM PHOSPHATE 10 MG/ML IJ SOLN
INTRAMUSCULAR | Status: DC | PRN
  Administered 2022-06-28: 5 mg via INTRAVENOUS

## 2022-06-28 MED ORDER — POLYETHYLENE GLYCOL 3350 17 G PO PACK: 17.0000 g | PACK | Freq: Every day | ORAL | Status: DC | PRN

## 2022-06-28 MED ORDER — LIDOCAINE-EPINEPHRINE 1 %-1:100000 IJ SOLN
INTRAMUSCULAR | Status: AC
Start: 2022-06-28 — End: ?
  Filled 2022-06-28: qty 1

## 2022-06-28 MED ORDER — MENTHOL 3 MG MT LOZG: 1.0000 | LOZENGE | OROMUCOSAL | Status: DC | PRN

## 2022-06-28 MED ORDER — LACTATED RINGERS IV SOLN: INTRAVENOUS | Status: DC | PRN

## 2022-06-28 MED ORDER — MOMETASONE FURO-FORMOTEROL FUM 200-5 MCG/ACT IN AERO
2.0000 | INHALATION_SPRAY | Freq: Two times a day (BID) | RESPIRATORY_TRACT | Status: DC
  Administered 2022-06-29 – 2022-07-11 (×23): 2 via RESPIRATORY_TRACT
  Filled 2022-06-28: qty 8.8

## 2022-06-28 MED ORDER — SODIUM CHLORIDE 0.9% FLUSH
3.0000 mL | Freq: Two times a day (BID) | INTRAVENOUS | Status: DC
  Administered 2022-06-28 – 2022-07-11 (×23): 3 mL via INTRAVENOUS

## 2022-06-28 MED ORDER — HYDROMORPHONE HCL 1 MG/ML IJ SOLN
1.0000 mg | INTRAMUSCULAR | Status: DC | PRN
  Administered 2022-06-28 – 2022-07-10 (×29): 1 mg via INTRAVENOUS
  Filled 2022-06-28 (×27): qty 1

## 2022-06-28 MED ORDER — BUPIVACAINE LIPOSOME 1.3 % IJ SUSP
INTRAMUSCULAR | Status: DC | PRN
  Administered 2022-06-28: 20 mL

## 2022-06-28 MED ORDER — PHENOL 1.4 % MT LIQD: 1.0000 | OROMUCOSAL | Status: DC | PRN

## 2022-06-28 MED ORDER — MIDAZOLAM HCL 2 MG/2ML IJ SOLN
INTRAMUSCULAR | Status: AC
  Filled 2022-06-28: qty 2

## 2022-06-28 MED ORDER — SODIUM CHLORIDE 0.9 % IV SOLN: 250.0000 mL | INTRAVENOUS | Status: DC

## 2022-06-28 MED ORDER — CLINDAMYCIN PHOSPHATE 900 MG/50ML IV SOLN
INTRAVENOUS | Status: DC | PRN
  Administered 2022-06-28: 900 mg via INTRAVENOUS

## 2022-06-28 MED ORDER — LUBIPROSTONE 24 MCG PO CAPS: 24.0000 ug | ORAL_CAPSULE | Freq: Every day | ORAL | Status: DC | PRN

## 2022-06-28 MED ORDER — SODIUM CHLORIDE 0.9 % IV SOLN: INTRAVENOUS | Status: DC

## 2022-06-28 MED ORDER — FENTANYL CITRATE (PF) 250 MCG/5ML IJ SOLN
INTRAMUSCULAR | Status: AC
  Filled 2022-06-28: qty 5

## 2022-06-28 SURGICAL SUPPLY — 90 items
BAG COUNTER SPONGE SURGICOUNT (BAG) ×1 IMPLANT
BASKET BONE COLLECTION (BASKET) ×1 IMPLANT
BLADE BONE MILL MEDIUM (MISCELLANEOUS) IMPLANT
BUR 14 MATCH 3 (BUR) IMPLANT
BUR MATCHSTICK NEURO 3.0 LAGG (BURR) ×1 IMPLANT
BUR MR8 14 BALL 4 (BUR) IMPLANT
BUR MR8 14 BALL 5 (BUR) IMPLANT
BUR PRECISION FLUTE 5.0 (BURR) ×1 IMPLANT
BURR 14 MATCH 3 (BUR) ×1
BURR MR8 14 BALL 4 (BUR)
BURR MR8 14 BALL 5 (BUR)
CANISTER SUCT 3000ML PPV (MISCELLANEOUS) ×1 IMPLANT
CNTNR URN SCR LID CUP LEK RST (MISCELLANEOUS) ×1 IMPLANT
CONNECTOR AX 5.5-5.5 (Connector) IMPLANT
CONT SPEC 4OZ STRL OR WHT (MISCELLANEOUS) ×3
COVER BACK TABLE 60X90IN (DRAPES) ×4 IMPLANT
COVERAGE SUPPORT O-ARM STEALTH (MISCELLANEOUS) ×1 IMPLANT
DERMABOND IMPLANT
DERMABOND ADVANCED (GAUZE/BANDAGES/DRESSINGS) ×2
DERMABOND ADVANCED .7 DNX12 (GAUZE/BANDAGES/DRESSINGS) ×2 IMPLANT
DRAIN JACKSON PRATT 10MM FLAT (MISCELLANEOUS) IMPLANT
DRAPE 3/4 80X56 (DRAPES) ×1 IMPLANT
DRAPE C-ARM 42X72 X-RAY (DRAPES) ×1 IMPLANT
DRAPE C-ARMOR (DRAPES) ×1 IMPLANT
DRAPE LAPAROTOMY 100X72X124 (DRAPES) ×1 IMPLANT
DRSG OPSITE 4X5.5 SM (GAUZE/BANDAGES/DRESSINGS) IMPLANT
DRSG OPSITE POSTOP 4X10 (GAUZE/BANDAGES/DRESSINGS) IMPLANT
DRSG OPSITE POSTOP 4X6 (GAUZE/BANDAGES/DRESSINGS) IMPLANT
DURAPREP 26ML APPLICATOR (WOUND CARE) ×1 IMPLANT
ELECT COATED BLADE 2.86 ST (ELECTRODE) IMPLANT
ELECT REM PT RETURN 9FT ADLT (ELECTROSURGICAL) ×1
ELECTRODE REM PT RTRN 9FT ADLT (ELECTROSURGICAL) ×1 IMPLANT
EVACUATOR SILICONE 100CC (DRAIN) IMPLANT
FEE COVERAGE SUPPORT O-ARM (MISCELLANEOUS) IMPLANT
GAUZE 4X4 16PLY ~~LOC~~+RFID DBL (SPONGE) IMPLANT
GAUZE SPONGE 4X4 12PLY STRL (GAUZE/BANDAGES/DRESSINGS) IMPLANT
GLOVE BIOGEL PI IND STRL 7.5 (GLOVE) ×1 IMPLANT
GLOVE BIOGEL PI INDICATOR 7.5 (GLOVE) ×2
GLOVE ECLIPSE 7.5 STRL STRAW (GLOVE) ×2 IMPLANT
GLOVE SURG ENC MOIS LTX SZ8 (GLOVE) ×1 IMPLANT
GLOVE SURG UNDER POLY LF SZ8.5 (GLOVE) ×1 IMPLANT
GOWN STRL REUS W/ TWL LRG LVL3 (GOWN DISPOSABLE) ×1 IMPLANT
GOWN STRL REUS W/ TWL XL LVL3 (GOWN DISPOSABLE) ×2 IMPLANT
GOWN STRL REUS W/TWL 2XL LVL3 (GOWN DISPOSABLE) IMPLANT
GOWN STRL REUS W/TWL LRG LVL3 (GOWN DISPOSABLE) ×1
GOWN STRL REUS W/TWL XL LVL3 (GOWN DISPOSABLE) ×3
HEMOSTAT POWDER KIT SURGIFOAM (HEMOSTASIS) ×1 IMPLANT
KIT BASIN OR (CUSTOM PROCEDURE TRAY) ×1 IMPLANT
KIT POSITION SURG JACKSON T1 (MISCELLANEOUS) IMPLANT
KIT STIMULAN RAPID CURE  10CC (Orthopedic Implant) ×1 IMPLANT
KIT STIMULAN RAPID CURE 10CC (Orthopedic Implant) IMPLANT
KIT TURNOVER KIT B (KITS) ×1 IMPLANT
MARKER SPHERE PSV REFLC NDI (MISCELLANEOUS) ×5 IMPLANT
MILL BONE PREP (MISCELLANEOUS) ×1 IMPLANT
NDL HYPO 18GX1.5 BLUNT FILL (NEEDLE) IMPLANT
NDL HYPO 21X1.5 SAFETY (NEEDLE) ×1 IMPLANT
NDL SPNL 18GX3.5 QUINCKE PK (NEEDLE) IMPLANT
NEEDLE HYPO 18GX1.5 BLUNT FILL (NEEDLE) IMPLANT
NEEDLE HYPO 21X1.5 SAFETY (NEEDLE) ×1 IMPLANT
NEEDLE HYPO 22GX1.5 SAFETY (NEEDLE) ×1 IMPLANT
NEEDLE SPNL 18GX3.5 QUINCKE PK (NEEDLE) IMPLANT
NS IRRIG 1000ML POUR BTL (IV SOLUTION) ×1 IMPLANT
PACK LAMINECTOMY NEURO (CUSTOM PROCEDURE TRAY) ×1 IMPLANT
PAD ARMBOARD 7.5X6 YLW CONV (MISCELLANEOUS) ×3 IMPLANT
PUTTY DBF GRAFT 12CC W/DELIVE (Putty) IMPLANT
RASP 3.0MM (RASP) IMPLANT
ROD CVD SOLERA 5.5X90 (Rod) IMPLANT
SCREW BREAK OFF LGY55 (Screw) IMPLANT
SCREW CONNECTOR SET (Screw) IMPLANT
SCREW FENS SOLERA 5.5X40 (Screw) IMPLANT
SCREW SET SOLERA (Screw) ×6 IMPLANT
SCREW SET SOLERA TI5.5 (Screw) IMPLANT
SCREW SOLERA 4.5X40 (Screw) IMPLANT
SCREW SOLERA 40X6.5XMA (Screw) IMPLANT
SCREW SOLERA 6.5X40MM (Screw) ×2 IMPLANT
SPONGE SURGIFOAM ABS GEL 100 (HEMOSTASIS) IMPLANT
SPONGE T-LAP 4X18 ~~LOC~~+RFID (SPONGE) IMPLANT
STAPLER VISISTAT 35W (STAPLE) ×1 IMPLANT
SUPPORT TECH COVERAGE MED NAV (MISCELLANEOUS) ×1
SURGILUBE 2OZ TUBE FLIPTOP (MISCELLANEOUS) IMPLANT
SUT MNCRL AB 4-0 PS2 18 (SUTURE) ×1 IMPLANT
SUT VIC AB 0 CT1 18XCR BRD8 (SUTURE) ×1 IMPLANT
SUT VIC AB 0 CT1 8-18 (SUTURE) ×3
SUT VIC AB 2-0 CP2 18 (SUTURE) ×1 IMPLANT
SYR 30ML LL (SYRINGE) ×1 IMPLANT
SYR BULB IRRIG 60ML STRL (SYRINGE) IMPLANT
TOWEL GREEN STERILE (TOWEL DISPOSABLE) ×1 IMPLANT
TOWEL GREEN STERILE FF (TOWEL DISPOSABLE) ×1 IMPLANT
TRAY FOLEY MTR SLVR 16FR STAT (SET/KITS/TRAYS/PACK) ×1 IMPLANT
WATER STERILE IRR 1000ML POUR (IV SOLUTION) ×1 IMPLANT

## 2022-06-28 NOTE — Progress Notes (Signed)
Pt seen and examined in preop.  I reviewed the surgical plan with her yesterday evening and answered all questions.  She has no further questions at this time.  Her neurologic exam is stable, with full strength in legs but significant deconditioning resulting in 4/5 proximal lower extremity strength.

## 2022-06-28 NOTE — Progress Notes (Signed)
[facility] for [facility]ious [facility]  Date of Admission:  06/20/2022           Reason for visit: Follow up on back infection  Current antibiotics: Daptomycin    ASSESSMENT:    66 y.o. 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  She has required lumbar fusion and hardware and had been continued on antibiotics for prolonged period of time, however, this was subsequently stopped at an unknown period of time.  She presents now with worsening back pain and MRI concerning for new infection with Staph epidermidis growing from a disc aspirate culture.  Planning for surgical management today with neurosurgery and currently on daptomycin. Acute on chronic kidney disease: Creatinine yesterday was stable and daptomycin dosed for current kidney function. Urinary tract infection: She had some urinary symptoms on admission with a UA that was consistent with possible infection.  Status post 6 days of ceftriaxone and this has been discontinued.  RECOMMENDATIONS:    Continue daptomycin per pharmacy Plan for OR today with Dr. 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)   Depression   Asthma   Anemia   DM (diabetes mellitus), type 2 with renal complications (HCC)    MEDICATIONS:    Scheduled Meds:  amLODipine  5 mg Oral Daily   carvedilol  12.5 mg Oral Daily   Chlorhexidine Gluconate Cloth  6 each Topical Daily   DULoxetine  60 mg Oral Daily   feeding supplement (GLUCERNA SHAKE)  237 mL Oral TID BM   insulin aspart  0-9 Units Subcutaneous Q4H   insulin  glargine-yfgn  8 Units Subcutaneous QHS   mometasone-formoterol  2 puff Inhalation BID   montelukast  10 mg Oral QHS   mupirocin cream   Topical Daily   mouth rinse  15 mL Mouth Rinse 4 times per day   pregabalin  25 mg Oral TID   sodium bicarbonate  650 mg Oral BID   tamsulosin  0.4 mg Oral Daily   Continuous Infusions:  DAPTOmycin (CUBICIN) 500 mg in sodium chloride 0.9 % IVPB 500 mg (06/27/22 1818)   PRN Meds:.acetaminophen, albuterol, diazepam, hydrALAZINE, HYDROmorphone (DILAUDID) injection, methocarbamol, naLOXone (NARCAN)  injection, ondansetron (ZOFRAN) IV, mouth rinse, oxyCODONE, simethicone  SUBJECTIVE:   24 hour events:  Patient transferred from [facility] to [facility] for back surgery with Dr. Marcello Moores This is scheduled for today She remains on daptomycin No new labs this morning Disc aspiration cultures thus far growing staph epidermidis.  Culture remains in progress.  She is accompanied by her husband today.  She has no new complaints.  She is awaiting surgery.  Review of Systems  All other systems reviewed and are negative.     OBJECTIVE:   Blood pressure (!) 157/67, pulse 64, temperature 97.9 F (36.6 C), temperature source Oral, resp. rate 16, height 5' 1"  (1.549 m), weight 87.5 kg, SpO2 95 %. Body mass index is 36.45 kg/m.  Physical Exam Constitutional:      Appearance: Normal appearance.  HENT:     Head: Normocephalic and atraumatic.  Eyes:  Extraocular Movements: Extraocular movements intact.     Conjunctiva/sclera: Conjunctivae normal.  Abdominal:     General: There is no distension.     Palpations: Abdomen is soft.  Musculoskeletal:        General: Normal range of motion.     Cervical back: Normal range of motion and neck supple.  Skin:    General: Skin is warm and dry.     Findings: No rash.  Neurological:     General: No focal deficit present.     Mental Status: She is alert and oriented to person, place, and time.  Psychiatric:         Mood and Affect: Mood normal.        Behavior: Behavior normal.      Lab Results: Lab Results  Component Value Date   WBC 5.5 06/27/2022   HGB 8.1 (L) 06/27/2022   HCT 27.0 (L) 06/27/2022   MCV 90.3 06/27/2022   PLT 120 (L) 06/27/2022    Lab Results  Component Value Date   NA 139 06/27/2022   K 4.1 06/27/2022   CO2 18 (L) 06/27/2022   GLUCOSE 89 06/27/2022   BUN 43 (H) 06/27/2022   CREATININE 3.55 (H) 06/27/2022   CALCIUM 8.5 (L) 06/27/2022   GFRNONAA 14 (L) 06/27/2022   GFRAA 31 (L) 06/16/2020    Lab Results  Component Value Date   ALT 10 06/23/2022   AST 15 06/23/2022   ALKPHOS 81 06/23/2022   BILITOT 0.4 06/23/2022       Component Value Date/Time   CRP 2.0 (H) 06/20/2022 1813       Component Value Date/Time   ESRSEDRATE 126 (H) 06/20/2022 1813     I have reviewed the micro and lab results in Epic.  Imaging: No results found.   Imaging  independently reviewed in Epic.    Raynelle Highland for Infectious Disease [facility] [phone] pager 06/28/2022, 7:25 AM

## 2022-06-28 NOTE — Progress Notes (Signed)
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  Back pain is usually chronic but worsened.  She has been following with neurosurgery as outpatient, she has extensive vertebral damages that has been under investigation.  Work-up is revealed negative abdominal pathology on CT however she does have T10-11 disc space infection.      Subjective   Patient seen and examined, plan for surgery per neurosurgery today   Assessment/Plan:    Acute kidney injury on CKD stage IV -Creatinine baseline 2.6-3 -Creatinine has improved to 3.94 -Creatinine had peaked at 4.89, nephrology was consulted -Chart review documented patient has history of renal transplant several years ago -Patient is status post renal transplant 50 years ago -She is not immunocompromised and not on any immunosuppressants -But per nephrology, patient never had renal transplant.  She is not on any immunosuppressants.  She does not have a scar in the abdomen to suggest transplant.    T10/T11 discitis/vertebral osteomyelitis -History of vertebral osteomyelitis/back surgeries -Presented with worsening back pain for past 2 to 3 weeks -CT on admission showed discitis/osteomyelitis of T10/T11 -Neurosurgery, ID and IR were consulted -On 8/25 patient underwent disc aspiration per IR -Started on daptomycin and ceftriaxone per ID -Neurosurgery saw patient and recommended transfer to [facility] -Patient came to [facility] and plan for surgery today  Chronic  DVT -Patient was taking Eliquis which is currently on hold since 06/18/2022 -Neurosurgery has no plan to take patient for surgery -Okay to start prophylactic anticoagulation -Due to GFR Eliquis no longer indicated -Patient started on heparin per pharmacy  Diabetes mellitus type 2 -Continue sliding scale insulin NovoLog -Continue Lantus 8 units subcu daily  HFpEF -Patient appears euvolemic at this time -Lasix on hold  Anemia of chronic disease -Hemoglobin at baseline  Anxiety/depression -Continue Cymbalta daily -Continue Valium as needed   Medications     sodium chloride   Intravenous Once   [MAR Hold] amLODipine  5 mg Oral Daily   [MAR Hold] carvedilol  12.5 mg Oral Daily   chlorhexidine       [MAR Hold] Chlorhexidine Gluconate Cloth  6 each Topical Daily   [MAR Hold] DULoxetine  60 mg Oral Daily   [MAR Hold] feeding supplement (GLUCERNA SHAKE)  237 mL Oral TID BM   [MAR Hold] insulin aspart  0-9 Units Subcutaneous Q4H   [MAR Hold] insulin glargine-yfgn  8 Units Subcutaneous QHS   [MAR Hold] mometasone-formoterol  2 puff Inhalation BID   [MAR Hold] montelukast  10 mg Oral QHS   [MAR Hold] mupirocin cream   Topical Daily   [MAR Hold] mouth rinse  15 mL Mouth Rinse 4 times per day   [facility] Hold] pregabalin  25 mg Oral TID   [MAR Hold] sodium bicarbonate  650 mg Oral BID   [MAR Hold] tamsulosin  0.4 mg Oral Daily     Data Reviewed:   CBG:  Recent Labs  Lab 06/28/22 0959 06/28/22 1157 06/28/22 1348 06/28/22 1520 06/28/22 1728  GLUCAP 100* 84 82 82 131*    SpO2: 94 % O2  Flow Rate (L/min): 3 L/min    Vitals:   06/28/22 1205 06/28/22 1247 06/28/22 1255 06/28/22 1258  BP: (!) 158/62     Pulse: 66 70  73  Resp:    19  Temp: 97.6 F (36.4 C) 98.4 F (36.9 C)    TempSrc: Oral     SpO2: 99% (!) 88% 92% 94%  Weight:      Height:    4' 11"  (1.499 m)      Data Reviewed:  Basic Metabolic Panel: Recent Labs  Lab 06/24/22 0602 06/25/22 0604  06/26/22 0457 06/27/22 0638 06/28/22 1000  NA 141 139 140 139 141  K 4.4 3.8 3.4* 4.1 4.2  CL 120* 115* 114* 115* 113*  CO2 16* 17* 20* 18* 20*  GLUCOSE 127* 139* 97 89 105*  BUN 58* 47* 45* 43* 42*  CREATININE 4.48* 3.77* 3.49* 3.55* 3.94*  CALCIUM 8.0* 8.1* 8.2* 8.5* 8.4*  PHOS 5.1*  --   --   --   --     CBC: Recent Labs  Lab 06/23/22 0620 06/25/22 0604 06/26/22 0457 06/27/22 0830 06/28/22 1000  WBC 6.5 6.1 6.6 5.5 5.2  HGB 8.7* 8.3* 8.1* 8.1* 8.0*  HCT 28.7* 27.1* 26.3* 27.0* 25.5*  MCV 90.8 90.0 88.9 90.3 88.5  PLT 171 137* 148* 120* 130*    LFT Recent Labs  Lab 06/23/22 0620 06/24/22 0602  AST 15  --   ALT 10  --   ALKPHOS 81  --   BILITOT 0.4  --   PROT 6.9  --   ALBUMIN 2.7* 2.3*     Antibiotics: Anti-infectives (From admission, onward)    Start     Dose/Rate Route Frequency Ordered Stop   06/28/22 1738  vancomycin (VANCOCIN) powder  Status:  Discontinued          As needed 06/28/22 1739 06/28/22 1823   06/28/22 1504  clindamycin (CLEOCIN) 900 MG/50ML IVPB       Note to Pharmacy: Rush Farmer E: cabinet override      06/28/22 1504 06/28/22 1508   06/23/22 1600  [MAR Hold]  DAPTOmycin (CUBICIN) 500 mg in sodium chloride 0.9 % IVPB        (MAR Hold since Wed 06/28/2022 at 1239.Hold Reason: Transfer to a Procedural area)   8 mg/kg  59.6 kg (Adjusted) 120 mL/hr over 30 Minutes Intravenous Every 48 hours 06/23/22 1346     06/23/22 1500  DAPTOmycin (CUBICIN) 500 mg in sodium chloride 0.9 % IVPB  Status:  Discontinued        8 mg/kg  59.6 kg (Adjusted) 120 mL/hr over 30 Minutes Intravenous Daily 06/23/22 1339 06/23/22 1346   06/21/22 2200  cefTRIAXone (ROCEPHIN) 2 g in sodium chloride 0.9 % 100 mL IVPB  Status:  Discontinued        2 g 200 mL/hr over 30 Minutes Intravenous Every 24 hours 06/21/22 0025 06/26/22 1454   06/21/22 0108  vancomycin variable dose per unstable renal function (pharmacist dosing)  Status:  Discontinued         Does not apply See  admin instructions 06/21/22 0108 06/21/22 0906   06/21/22 0045  vancomycin (VANCOREADY) IVPB 1500 mg/300 mL        1,500 mg 150 mL/hr over 120 Minutes Intravenous  Once 06/21/22 0031 06/21/22 0255   06/20/22 2145  cefTRIAXone (ROCEPHIN) 2 g in sodium chloride 0.9 % 100 mL IVPB        2 g 200 mL/hr over 30  Minutes Intravenous  Once 06/20/22 2133 06/20/22 2312        DVT prophylaxis: Heparin  Code Status: Full code  Family Communication:    CONSULTS    Objective    Physical Examination:  General-appears in no acute distress Heart-S1-S2, regular, no murmur auscultated Lungs-clear to auscultation bilaterally, no wheezing or crackles auscultated Abdomen-soft, positive tenderness in suprapubic region , no organomegaly Extremities-no edema in the lower extremities Neuro-alert, oriented x3, no focal deficit noted  Status is: Inpatient:             Oswald Hillock   [facility] If 7PM-7AM, please contact night-coverage at [email or web address], Office  [phone]   06/28/2022, 6:28 PM  LOS: 8 days

## 2022-06-28 NOTE — Anesthesia Procedure Notes (Signed)
Arterial Line Insertion Start/End8/30/2023 2:25 PM, 06/28/2022 2:27 PM Performed by: Lowella Dell, CRNA, CRNA  Patient location: OR. Left, radial was placed Catheter size: 20 G Hand hygiene performed  and maximum sterile barriers used   Attempts: 1 Procedure performed without using ultrasound guided technique. Ultrasound Notes:anatomy identified, needle tip was noted to be adjacent to the nerve/plexus identified and no ultrasound evidence of intravascular and/or intraneural injection Following insertion, dressing applied and Biopatch. Post procedure assessment: unchanged  Patient tolerated the procedure well with no immediate complications. Additional procedure comments: Placed by Lissa Hoard SRNA under Willeen Cass CRNA supervision.

## 2022-06-28 NOTE — Anesthesia Postprocedure Evaluation (Signed)
Anesthesia Post Note  Patient: [name] [name]  Procedure(s) Performed: POSTERIOR EXPLORATION/REVISION OF POSTERIOR THORACIC ELEVEN-LUMBAR THREE, EXTENSION OF FUSION THORACIC NINE-THORACIC TEN, THORACIC TEN-THORACIC ELEVEN WITH VERTEBRAL AUGMENTATION Application of O-Arm     Patient location during evaluation: PACU Anesthesia Type: General Level of consciousness: awake Pain management: pain level controlled Vital Signs Assessment: post-procedure vital signs reviewed and stable Respiratory status: spontaneous breathing, nonlabored ventilation, respiratory function stable and patient connected to nasal cannula oxygen Cardiovascular status: blood pressure returned to baseline and stable Postop Assessment: no apparent nausea or vomiting Anesthetic complications: no   No notable events documented.  Last Vitals:  Vitals:   06/28/22 1845 06/28/22 1945  BP: (!) 165/73 (!) 165/79  Pulse: (!) 52 (!) 55  Resp: 13 12  Temp:  36.6 C  SpO2: 95% 94%    Last Pain:  Vitals:   06/28/22 1945  TempSrc: Axillary  PainSc:                  [name] [name]

## 2022-06-28 NOTE — Anesthesia Procedure Notes (Signed)
Procedure Name: Intubation Date/Time: 06/28/2022 2:11 PM  Performed by: Lowella Dell, CRNAPre-anesthesia Checklist: Patient identified, Emergency Drugs available, Suction available and Patient being monitored Patient Re-evaluated:Patient Re-evaluated prior to induction Oxygen Delivery Method: Circle System Utilized Preoxygenation: Pre-oxygenation with 100% oxygen Induction Type: IV induction Ventilation: Mask ventilation without difficulty Laryngoscope Size: Mac and 3 Grade View: Grade I Tube type: Oral Tube size: 7.0 mm Number of attempts: 1 Airway Equipment and Method: Stylet and Oral airway Placement Confirmation: ETT inserted through vocal cords under direct vision, positive ETCO2 and breath sounds checked- equal and bilateral Secured at: 21 cm Tube secured with: Tape Dental Injury: Teeth and Oropharynx as per pre-operative assessment  Future Recommendations: Recommend- induction with short-acting agent, and alternative techniques readily available

## 2022-06-28 NOTE — Transfer of Care (Signed)
Immediate Anesthesia Transfer of Care Note  Patient: Puneet Myna Hidalgo  Procedure(s) Performed: POSTERIOR EXPLORATION/REVISION OF POSTERIOR THORACIC ELEVEN-LUMBAR THREE, EXTENSION OF FUSION THORACIC NINE-THORACIC TEN, THORACIC TEN-THORACIC ELEVEN WITH VERTEBRAL AUGMENTATION Application of O-Arm  Patient Location: PACU  Anesthesia Type:General  Level of Consciousness: drowsy and patient cooperative  Airway & Oxygen Therapy: Patient Spontanous Breathing and Patient connected to nasal cannula oxygen  Post-op Assessment: Report given to RN, Post -op Vital signs reviewed and stable and Patient moving all extremities  Post vital signs: Reviewed and stable  Last Vitals:  Vitals Value Taken Time  BP 145/63 06/28/22 1830  Temp    Pulse 51 06/28/22 1830  Resp 15 06/28/22 1830  SpO2 93 % 06/28/22 1830    Last Pain:  Vitals:   06/28/22 1205  TempSrc: Oral  PainSc:       Patients Stated Pain Goal: 2 (25/24/79 9800)  Complications: No notable events documented.

## 2022-06-28 NOTE — Op Note (Signed)
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  Posterior arthrodesis T8-9, T9-10, T10-11 2. Segmental instrumentation with T8, T9, T10 pedicle screws connecting to previous instrumentation 3. Removal of T11 pedicle screws and partial removal of rod 4. Use of morselized allograft 5.  Intraoperative navigation with Stealth and intraoperative CT with O-arm   SURGEON: Dr. Duffy Rhody, MD   ASSISTANT:  Weston Brass, NP. Please note there were no qualified trainees available to assist with the procedure   ANESTHESIA: General Endotracheal   EBL: 1 L   IMPLANTS:  [employer] Pedicle screws T8: 5.5 x 40 / 4.5 x 40 mm T9: 5.5 x 40 mm x 2 T10: 6.5 x 40 x 2 90 mm rods 10 ml Stimulan vancomycin beads DBF   SPECIMENS:  Paraspinous infection over right T11 screw T10-11 disc space   DRAINS: None   COMPLICATIONS: None   CONDITION: Stable to PACU   HISTORY: This is a 66 year old lady with chronic kidney disease, diabetes, and liver cirrhosis who previously had underwent L1 corpectomy and reconstruction with percutaneous instrumentation from T11-L3 for 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  She was doing well but developed progressive back pain and progressive proximal junctional kyphosis at T10-11 which had progressed to the point that she was no longer ambulatory.  There was concern for indolent T10-11 osteomyelitis discitis with infection of the right T10 screw as well as hardware failure so revision surgery was offered to the patient but she canceled the surgery.  Unfortunately, her pain continued to worsen and she was admitted to the hospital for severe pain where disc aspiration confirmed Staph epidermidis infection at T10-11.  She was started on daptomycin with renal dosing.  After a long discussion, she wished to proceed with  surgery to help with infection control and stabilization of her spine.  I discussed with her risk, benefits, alternatives, and expected convalescence.  Risks discussed included but were not limited to bleeding, pain, infection, scar, pseudoarthrosis, fracture, neurologic deficit, paralysis, and death.  The patient wished to proceed with surgery and informed consent was obtained.   PROCEDURE IN DETAIL: After informed consent was obtained and witnessed, the patient was brought to the operating room. After induction of general anesthesia, the patient was positioned on the operative table in the prone position on a Wilson frame in neutral position with all pressure points meticulously padded. The skin of the mid back was then prepped and draped in the usual sterile fashion.  1% lidocaine with epinephrine was injected in the skin and a timeout was performed.  Preoperative antibiotics were administered.  Midline incision spanning from T11-T9 was made with a 10 blade.  Subcutaneous tissue and the fascia was incised with monopolar electrocautery.  The paraspinous muscles and scar were dissected off the lamina in subperiosteal fashion and dissected off the instrumentation.  And exposing the right T11 screw, there is small amount of slightly cloudy but mostly clear purulent fluid overlying the screw in the muscle.  This was sampled with swab and sent for culture.  The rod was then cut with high-speed drill just below the T11 screws.  The screw caps were removed and the T11 screws removed and sent as specimen.  Swab was performed through the pedicle holes for specimen gathering.  The disc space was debrided with suction as well as irrigation through the pedicle screw holes.  A spinous process clamp  was then affixed to T11 spinous process and an intraoperative CT was performed with the O-arm.  Registration was performed to allow for navigation with the Stealth system.  Transverse processes of T9 and T10 were decorticated and  using Stealth navigation, pedicle screw pilot holes were drilled with a high-speed drill and tapped and appropriately sized pedicle screws were placed.  There was good purchase at T10, with the screws superiorly oriented to distance themselves from the T10-11 discitis and osteomyelitis.  The pedicle screws at T9 had only acceptable purchase due to the patient's osteopenia.  As I wanted to avoid vertebral augmentation in the setting of infection, I elected to expose T8 lamina and transverse processes and using navigation, pedicle screws were placed at T8 for greater fixation.  C-arm x-ray confirmed good placement of the pedicle screws.  Axial connector was then connected to the superior aspect of the rod and a 90 mm rod was contoured to fit in the tulip heads and the connector bilaterally.  Screw caps were placed and final tightened.  The wound was irrigated thoroughly.  Decortication was performed of the T11, T10, T9, T8 lamina as well as transverse processes.  DBF allograft was placed in the lateral gutters and over the lamina bilaterally and vancomycin beads were placed in the wound.  A 10 flat JP drain was placed in the submuscular space.  The paraspinous musculature was injected with Exparel mixed with Marcaine.  The muscle was closed with 0 Vicryl stitches.  The fascia was closed with 0 Vicryl stitches.  The dermal layer was closed with 2-0 Vicryl stitches in buried interrupted fashion.  Skin was closed with 4 Monocryl in subcuticular manner.  Dermabond and a sterile dressing was then placed.  Patient was then flipped supine and extubated by the anesthesia service.  All counts were correct at the end of surgery.  No complications were noted.

## 2022-06-28 NOTE — Anesthesia Preprocedure Evaluation (Addendum)
Anesthesia Evaluation  Patient identified by MRN, date of birth, ID band Patient awake    Reviewed: Allergy & Precautions, NPO status , Patient's Chart, lab work & pertinent test results  History of Anesthesia Complications Negative for: history of anesthetic complications  Airway Mallampati: III  TM Distance: >3 FB Neck ROM: Full    Dental  (+) Dental Advisory Given, Chipped   Pulmonary asthma , sleep apnea ,    Pulmonary exam normal        Cardiovascular hypertension, Pt. on medications and Pt. on home beta blockers + CAD and + Past MI  Normal cardiovascular exam   '23 TTE - EF 65 to 70%. There is moderate asymmetric left ventricular hypertrophy. Trivial mitral valve regurgitation.     Neuro/Psych PSYCHIATRIC DISORDERS Anxiety Depression  Neuromuscular disease    GI/Hepatic negative GI ROS, (+) Cirrhosis       ,   Endo/Other  diabetes, Type 2, Insulin DependentHypothyroidism  Obesity   Renal/GU CRFRenal disease S/p renal transplant      Musculoskeletal  (+) Arthritis , Osteoarthritis,  Fibromyalgia -, narcotic dependent  Abdominal   Peds  Hematology  (+) Blood dyscrasia, anemia ,  Plt 130k On eliquis    Anesthesia Other Findings T10/T11 discitis/vertebral osteomyelitis   Reproductive/Obstetrics                            Anesthesia Physical Anesthesia Plan  ASA: 4  Anesthesia Plan: General   Post-op Pain Management: Dilaudid IV and Ketamine IV*   Induction: Intravenous  PONV Risk Score and Plan: 3 and Treatment may vary due to age or medical condition, Ondansetron and Dexamethasone  Airway Management Planned: Oral ETT  Additional Equipment: None  Intra-op Plan:   Post-operative Plan: Extubation in OR  Informed Consent: I have reviewed the patients History and Physical, chart, labs and discussed the procedure including the risks, benefits and alternatives for the  proposed anesthesia with the patient or authorized representative who has indicated his/her understanding and acceptance.     Dental advisory given  Plan Discussed with: CRNA and Anesthesiologist  Anesthesia Plan Comments:        Anesthesia Quick Evaluation

## 2022-06-29 DIAGNOSIS — N179 Acute kidney failure, unspecified: Secondary | ICD-10-CM | POA: Diagnosis not present

## 2022-06-29 DIAGNOSIS — M869 Osteomyelitis, unspecified: Secondary | ICD-10-CM | POA: Diagnosis not present

## 2022-06-29 DIAGNOSIS — N3000 Acute cystitis without hematuria: Secondary | ICD-10-CM | POA: Diagnosis not present

## 2022-06-29 DIAGNOSIS — M8618 Other acute osteomyelitis, other site: Secondary | ICD-10-CM | POA: Diagnosis not present

## 2022-06-29 LAB — COMPREHENSIVE METABOLIC PANEL
ALT: 13 U/L (ref 0–44)
AST: 16 U/L (ref 15–41)
Albumin: 2.2 g/dL — ABNORMAL LOW (ref 3.5–5.0)
Alkaline Phosphatase: 86 U/L (ref 38–126)
Anion gap: 7 (ref 5–15)
BUN: 45 mg/dL — ABNORMAL HIGH (ref 8–23)
CO2: 18 mmol/L — ABNORMAL LOW (ref 22–32)
Calcium: 8.3 mg/dL — ABNORMAL LOW (ref 8.9–10.3)
Chloride: 114 mmol/L — ABNORMAL HIGH (ref 98–111)
Creatinine, Ser: 3.9 mg/dL — ABNORMAL HIGH (ref 0.44–1.00)
GFR, Estimated: 12 mL/min — ABNORMAL LOW (ref >60)
Glucose, Bld: 165 mg/dL — ABNORMAL HIGH (ref 70–99)
Potassium: 5.3 mmol/L — ABNORMAL HIGH (ref 3.5–5.1)
Sodium: 139 mmol/L (ref 135–145)
Total Bilirubin: 0.6 mg/dL (ref 0.3–1.2)
Total Protein: 6.4 g/dL — ABNORMAL LOW (ref 6.5–8.1)

## 2022-06-29 LAB — BPAM RBC
Blood Product Expiration Date: 202309212359
Blood Product Expiration Date: 202309242359
ISSUE DATE / TIME: 202308301345
ISSUE DATE / TIME: 202308301345
Unit Type and Rh: 600
Unit Type and Rh: 600

## 2022-06-29 LAB — CBC
HCT: 31.4 % — ABNORMAL LOW (ref 36.0–46.0)
Hemoglobin: 9.9 g/dL — ABNORMAL LOW (ref 12.0–15.0)
MCH: 28 pg (ref 26.0–34.0)
MCHC: 31.5 g/dL (ref 30.0–36.0)
MCV: 88.7 fL (ref 80.0–100.0)
Platelets: 136 10*3/uL — ABNORMAL LOW (ref 150–400)
RBC: 3.54 MIL/uL — ABNORMAL LOW (ref 3.87–5.11)
RDW: 16.4 % — ABNORMAL HIGH (ref 11.5–15.5)
WBC: 8.5 10*3/uL (ref 4.0–10.5)
nRBC: 0 % (ref 0.0–0.2)

## 2022-06-29 LAB — GLUCOSE, CAPILLARY
Glucose-Capillary: 114 mg/dL — ABNORMAL HIGH (ref 70–99)
Glucose-Capillary: 124 mg/dL — ABNORMAL HIGH (ref 70–99)
Glucose-Capillary: 140 mg/dL — ABNORMAL HIGH (ref 70–99)
Glucose-Capillary: 148 mg/dL — ABNORMAL HIGH (ref 70–99)
Glucose-Capillary: 151 mg/dL — ABNORMAL HIGH (ref 70–99)
Glucose-Capillary: 170 mg/dL — ABNORMAL HIGH (ref 70–99)

## 2022-06-29 LAB — TYPE AND SCREEN
ABO/RH(D): A NEG
Antibody Screen: NEGATIVE
Unit division: 0
Unit division: 0

## 2022-06-29 LAB — HEPARIN LEVEL (UNFRACTIONATED): Heparin Unfractionated: <0.1 IU/mL — ABNORMAL LOW (ref 0.30–0.70)

## 2022-06-29 MED ORDER — SODIUM ZIRCONIUM CYCLOSILICATE 5 G PO PACK
5.0000 g | PACK | Freq: Once | ORAL | Status: AC
  Administered 2022-06-29: 5 g via ORAL
  Filled 2022-06-29: qty 1

## 2022-06-29 MED FILL — Thrombin For Soln 5000 Unit: CUTANEOUS | Qty: 5000 | Status: AC

## 2022-06-29 NOTE — Progress Notes (Addendum)
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  Back pain is usually chronic but worsened.  She has been following with neurosurgery as outpatient, she has extensive vertebral damages that has been under investigation.  Work-up is revealed negative abdominal pathology on CT however she does have T10-11 disc space infection.      Subjective   Patient underwent underwent posterior arthrodesis T8-T11 with segmental instrumentation with T8-T10 pedicle screws connecting to previous instrumentation and removal of T11 pedicle screws and partial removal of rod.   Assessment/Plan:    Acute kidney injury on CKD stage IV -Creatinine baseline 2.6-3 -Creatinine has improved to 3.94 -Creatinine had peaked at 4.89, nephrology was consulted -Chart review documented patient has history of renal transplant several years ago -Patient is status post renal transplant 50 years ago -She is not immunocompromised and not on any immunosuppressants -But per nephrology, patient never had renal transplant.  She is not on any immunosuppressants.  She does not have a scar in the abdomen to suggest transplant.    T10/T11 discitis/vertebral osteomyelitis -History of vertebral osteomyelitis/back surgeries -Presented with worsening back pain for past 2 to 3 weeks -CT on admission showed discitis/osteomyelitis of T10/T11 -Neurosurgery, ID and IR were consulted -On 8/25 patient underwent disc aspiration per IR -Started on daptomycin and  ceftriaxone per ID -Neurosurgery saw patient and recommended transfer to [facility] -Patient came to [facility] and plan for surgery -Patient underwent surgery yesterday with posterior arthrodesis T8-T11 with segmental instrumentation with T8-T10 pedicle screws connecting to previous instrumentation and removal of T11 pedicle screws and partial removal of rod.  Chronic DVT -Patient was taking Eliquis which is currently on hold since 06/18/2022 -Underwent surgery yesterday -Started on Eliquis 2.5 mg p.o. twice daily  Diabetes mellitus type 2 -Continue sliding scale insulin NovoLog -Continue Lantus 8 units subcu daily -CBG well controlled  HFpEF -Patient appears euvolemic at this time -Continue Lasix 40 mg p.o. daily  Anemia of chronic disease -Hemoglobin at baseline  Anxiety/depression -Continue Cymbalta daily -Continue Valium as needed  Hyperkalemia -Potassium is mildly  elevated at 5.3 -1 dose of Lokelma 5 g p.o. -Follow BMP in am   Medications     sodium chloride   Intravenous Once   amLODipine  5 mg Oral Daily   [START ON 07/03/2022] apixaban  2.5 mg Oral BID   carvedilol  12.5 mg Oral Daily   Chlorhexidine Gluconate Cloth  6 each Topical Daily   cholecalciferol  5,000 Units Oral Daily   docusate sodium  100 mg Oral BID   DULoxetine  60 mg Oral Daily   enoxaparin (LOVENOX) injection  40 mg Subcutaneous Q24H   feeding supplement (GLUCERNA SHAKE)  237 mL Oral TID BM   folic acid  2 mg Oral Daily   furosemide  40 mg Oral Daily   insulin aspart  0-9 Units Subcutaneous Q4H   insulin glargine-yfgn  8 Units Subcutaneous QHS   mometasone-formoterol  2 puff Inhalation BID   montelukast  10 mg Oral QHS   multivitamin with minerals  1 tablet  Oral Daily   mupirocin cream   Topical Daily   mouth rinse  15 mL Mouth Rinse 4 times per day   pregabalin  25 mg Oral TID   sodium bicarbonate  650 mg Oral BID   sodium chloride flush  3 mL Intravenous Q12H   tamsulosin  0.4 mg Oral  Daily     Data Reviewed:   CBG:  Recent Labs  Lab 06/28/22 2124 06/28/22 2313 06/29/22 0404 06/29/22 0839 06/29/22 1128  GLUCAP 170* 166* 151* 140* 114*    SpO2: 91 % O2 Flow Rate (L/min): 2 L/min    Vitals:   06/28/22 2358 06/29/22 0409 06/29/22 0751 06/29/22 1128  BP: (!) 166/77 (!) 178/74 (!) 163/71 (!) 154/65  Pulse: 69 70 72 70  Resp: 18 18  15   Temp: 97.9 F (36.6 C) 98.2 F (36.8 C) 97.6 F (36.4 C) 97.7 F (36.5 C)  TempSrc: Oral  Oral Oral  SpO2: 96% 98% 97% 91%  Weight:      Height:          Data Reviewed:  Basic Metabolic Panel: Recent Labs  Lab 06/24/22 0602 06/25/22 0604 06/26/22 0457 06/27/22 0638 06/28/22 1000 06/28/22 1724 06/29/22 0408  NA 141 139 140 139 141 141 139  K 4.4 3.8 3.4* 4.1 4.2 4.5 5.3*  CL 120* 115* 114* 115* 113*  --  114*  CO2 16* 17* 20* 18* 20*  --  18*  GLUCOSE 127* 139* 97 89 105*  --  165*  BUN 58* 47* 45* 43* 42*  --  45*  CREATININE 4.48* 3.77* 3.49* 3.55* 3.94*  --  3.90*  CALCIUM 8.0* 8.1* 8.2* 8.5* 8.4*  --  8.3*  PHOS 5.1*  --   --   --   --   --   --     CBC: Recent Labs  Lab 06/25/22 0604 06/26/22 0457 06/27/22 0830 06/28/22 1000 06/28/22 1724 06/29/22 0408  WBC 6.1 6.6 5.5 5.2  --  8.5  HGB 8.3* 8.1* 8.1* 8.0* 8.5* 9.9*  HCT 27.1* 26.3* 27.0* 25.5* 25.0* 31.4*  MCV 90.0 88.9 90.3 88.5  --  88.7  PLT 137* 148* 120* 130*  --  136*    LFT Recent Labs  Lab 06/23/22 0620 06/24/22 0602 06/29/22 0408  AST 15  --  16  ALT 10  --  13  ALKPHOS 81  --  86  BILITOT 0.4  --  0.6  PROT 6.9  --  6.4*  ALBUMIN 2.7* 2.3* 2.2*     Antibiotics: Anti-infectives (From admission, onward)    Start     Dose/Rate Route Frequency Ordered Stop   06/28/22 1738  vancomycin (VANCOCIN) powder  Status:  Discontinued          As needed 06/28/22 1739 06/28/22 1823   06/28/22 1504  clindamycin (CLEOCIN) 900 MG/50ML IVPB       Note to Pharmacy: Rush Farmer E: cabinet override      06/28/22 1504 06/28/22  1508   06/23/22 1600  DAPTOmycin (CUBICIN) 500 mg in sodium chloride 0.9 % IVPB        8 mg/kg  59.6 kg (Adjusted) 120 mL/hr over 30 Minutes Intravenous Every 48 hours 06/23/22 1346     06/23/22 1500  DAPTOmycin (CUBICIN) 500 mg in sodium chloride 0.9 % IVPB  Status:  Discontinued        8 mg/kg  59.6 kg (Adjusted) 120 mL/hr over 30 Minutes Intravenous Daily 06/23/22 1339 06/23/22  1346   06/21/22 2200  cefTRIAXone (ROCEPHIN) 2 g in sodium chloride 0.9 % 100 mL IVPB  Status:  Discontinued        2 g 200 mL/hr over 30 Minutes Intravenous Every 24 hours 06/21/22 0025 06/26/22 1454   06/21/22 0108  vancomycin variable dose per unstable renal function (pharmacist dosing)  Status:  Discontinued         Does not apply See admin instructions 06/21/22 0108 06/21/22 0906   06/21/22 0045  vancomycin (VANCOREADY) IVPB 1500 mg/300 mL        1,500 mg 150 mL/hr over 120 Minutes Intravenous  Once 06/21/22 0031 06/21/22 0255   06/20/22 2145  cefTRIAXone (ROCEPHIN) 2 g in sodium chloride 0.9 % 100 mL IVPB        2 g 200 mL/hr over 30 Minutes Intravenous  Once 06/20/22 2133 06/20/22 2312        DVT prophylaxis: Lovenox for 4 days then Eliquis to be started from  07/03/2022  Code Status: Full code  Family Communication:    CONSULTS neurosurgery, infectious disease   Objective    Physical Examination:  General-appears in no acute distress Heart-S1-S2, regular, no murmur auscultated Lungs-clear to auscultation bilaterally, no wheezing or crackles auscultated Abdomen-soft, nontender, no organomegaly Extremities-no edema in the lower extremities Neuro-alert, oriented x3, no focal deficit noted  Status is: Inpatient:       Oswald Hillock   [facility] If 7PM-7AM, please contact night-coverage at [email or web address], Office  [phone]   06/29/2022, 1:24 PM  LOS: 9 days

## 2022-06-29 NOTE — Progress Notes (Signed)
[facility]  Date of Admission:  06/20/2022           Reason for visit: Follow up on discitis/OM  Current antibiotics: Daptomycin   ASSESSMENT:    66 y.o. 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  She has required lumbar fusion hardware and has been continued on antibiotics for prolonged periods of time, however, this was subsequently stopped and an unknown time point.  She presents this admission with worsening back pain and MRI concerning for new infection.  Status post disc space aspiration with cultures growing Staph epidermidis.  She went to the OR on 06/28/2022 and underwent posterior arthrodesis T8-T11 with segmental instrumentation with T8-T10 pedicle screws connecting to previous instrumentation and removal of T11 pedicle screws and partial removal of rod. Acute on chronic kidney disease: Creatinine clearance remains relatively stable. Urinary tract infection: She had some urinary symptoms on admission with a UA that was consistent with possible infection.  She received 6 days of ceftriaxone and this has since been discontinued on 06/25/2022.   RECOMMENDATIONS:    Continue daptomycin per pharmacy Follow OR cultures and disc aspirate cultures Lab monitoring Wound care She has a CT abdomen/pelvis ordered from Monday 8/28 due to abdominal distention, however, this is resolved.  She still complains of some abdominal pain but nothing acute.  She also had a CT abdomen/pelvis on 8/22 which was relatively unremarkable in terms of intra-abdominal pathology.  Will discontinue CT scan for now. Following   Principal Problem:   Osteomyelitis ([place]) Active Problems:   Essential hypertension   UTI (urinary tract infection)   AKI (acute kidney injury) (HCC)   Nausea & vomiting   Chronic deep vein thrombosis (DVT) of  both lower extremities (HCC)   Depression   Asthma   Anemia   DM (diabetes mellitus), type 2 with renal complications (HCC)    MEDICATIONS:    Scheduled Meds:  sodium chloride   Intravenous Once   amLODipine  5 mg Oral Daily   [START ON 07/03/2022] apixaban  2.5 mg Oral BID   carvedilol  12.5 mg Oral Daily   Chlorhexidine Gluconate Cloth  6 each Topical Daily   cholecalciferol  5,000 Units Oral Daily   docusate sodium  100 mg Oral BID   DULoxetine  60 mg Oral Daily   enoxaparin (LOVENOX) injection  40 mg Subcutaneous Q24H   feeding supplement (GLUCERNA SHAKE)  237 mL Oral TID BM   folic acid  2 mg Oral Daily   furosemide  40 mg Oral Daily   insulin aspart  0-9 Units Subcutaneous Q4H   insulin glargine-yfgn  8 Units Subcutaneous QHS   mometasone-formoterol  2 puff Inhalation BID   montelukast  10 mg Oral QHS   multivitamin with minerals  1 tablet Oral Daily   mupirocin cream   Topical Daily   mouth rinse  15 mL Mouth Rinse 4 times per day   pregabalin  25 mg Oral TID   sodium bicarbonate  650 mg Oral BID   sodium chloride flush  3 mL Intravenous Q12H   tamsulosin  0.4 mg Oral Daily   Continuous Infusions:  sodium chloride     0.9 % NaCl with KCl 20 mEq / L     DAPTOmycin (CUBICIN) 500 mg in sodium chloride 0.9 % IVPB 500 mg (06/27/22 1818)   PRN Meds:.acetaminophen **  OR** acetaminophen, acetaminophen, albuterol, diazepam, hydrALAZINE, HYDROmorphone (DILAUDID) injection, HYDROmorphone (DILAUDID) injection, lubiprostone, menthol-cetylpyridinium **OR** phenol, methocarbamol, naLOXone (NARCAN)  injection, ondansetron **OR** ondansetron (ZOFRAN) IV, mouth rinse, oxyCODONE, oxyCODONE, oxyCODONE, polyethylene glycol, simethicone, sodium chloride flush, sodium phosphate  SUBJECTIVE:   24 hour events:  Patient underwent posterior arthrodesis T8-T11 with segmental instrumentation with T8-T10 pedicle screws connecting to previous instrumentation and removal of T11 pedicle screws and  partial removal of rod yesterday with neurosurgery. Several cultures were sent to the OR Her initial disc aspirate cultures still positive for just staph epidermidis Afebrile WBC 8.5 Creatinine clearance relatively unchanged, creatinine 3.9  She reports being frustrated this morning.  She is having a fair amount of back pain.  She also reports ongoing abdominal pain.  She is hungry.  She is tired.  She has no fevers.  Review of Systems  All other systems reviewed and are negative.     OBJECTIVE:   Blood pressure (!) 178/74, pulse 70, temperature 98.2 F (36.8 C), resp. rate 18, height 4' 11"  (1.499 m), weight 87.5 kg, SpO2 98 %. Body mass index is 38.96 kg/m.  Physical Exam Constitutional:      Appearance: Normal appearance.  HENT:     Head: Normocephalic and atraumatic.  Eyes:     Extraocular Movements: Extraocular movements intact.     Conjunctiva/sclera: Conjunctivae normal.  Pulmonary:     Effort: Pulmonary effort is normal. No respiratory distress.  Abdominal:     General: There is no distension.     Palpations: Abdomen is soft.  Musculoskeletal:     Cervical back: Normal range of motion and neck supple.  Skin:    General: Skin is warm and dry.  Neurological:     General: No focal deficit present.     Mental Status: She is alert and oriented to person, place, and time.  Psychiatric:        Mood and Affect: Mood normal.        Behavior: Behavior normal.      Lab Results: Lab Results  Component Value Date   WBC 8.5 06/29/2022   HGB 9.9 (L) 06/29/2022   HCT 31.4 (L) 06/29/2022   MCV 88.7 06/29/2022   PLT 136 (L) 06/29/2022    Lab Results  Component Value Date   NA 139 06/29/2022   K 5.3 (H) 06/29/2022   CO2 18 (L) 06/29/2022   GLUCOSE 165 (H) 06/29/2022   BUN 45 (H) 06/29/2022   CREATININE 3.90 (H) 06/29/2022   CALCIUM 8.3 (L) 06/29/2022   GFRNONAA 12 (L) 06/29/2022   GFRAA 31 (L) 06/16/2020    Lab Results  Component Value Date   ALT 13  06/29/2022   AST 16 06/29/2022   ALKPHOS 86 06/29/2022   BILITOT 0.6 06/29/2022       Component Value Date/Time   CRP 2.0 (H) 06/20/2022 1813       Component Value Date/Time   ESRSEDRATE 126 (H) 06/20/2022 1813     I have reviewed the micro and lab results in Epic.  Imaging: DG Lumbar Spine 2-3 Views  Result Date: 06/28/2022 CLINICAL DATA:  Revision of posterior thoracic/lumbar spine. EXAM: LUMBAR SPINE - 2-3 VIEW COMPARISON:  MRI thoracic spine 06/22/2022. MRI lumbar spine 06/21/2022. FINDINGS: Intraoperative thoracolumbar spine. One low resolution intraoperative spot views of the thoracolumbar spine were obtained. No fracture visible on the limited views. Lower thoracic and upper lumbar fusion hardware is visualized. Total fluoroscopy time: 3.2 Total radiation dose: Not recorded IMPRESSION: Intraoperative thoracolumbar  spine. Electronically Signed   By: Ronney Asters M.D.   On: 06/28/2022 19:35   DG O-ARM IMAGE ONLY/NO REPORT  Result Date: 06/28/2022 There is no Radiologist interpretation  for this exam.  DG C-Arm 1-60 Min-No Report  Result Date: 06/28/2022 Fluoroscopy was utilized by the requesting physician.  No radiographic interpretation.   DG C-Arm 1-60 Min-No Report  Result Date: 06/28/2022 Fluoroscopy was utilized by the requesting physician.  No radiographic interpretation.     Imaging independently reviewed in Epic.    Raynelle Highland for Infectious Disease [facility] [phone] pager 06/29/2022, 7:23 AM

## 2022-06-29 NOTE — TOC Initial Note (Signed)
Transition of Care [facility]) - Initial/Assessment Note    Patient Details  Name: [name] [name] MRN: [id number] Date of Birth: 1956-04-22  [facility] CM/SW Contact:    Cyndi Bender, RN Phone Number: 06/29/2022, 4:32 PM  Clinical Narrative:                 Spoke to patient regarding transition needs. Patient doesn't want to go to SNF. She wants to go home with home health. She has used [facility] in the past. Husband works 3:30A-12P, home for lunch, then 1P-3:30P.  Neighbor can help if needed. Patient has used private duty caregivers in the past.  Tommi Rumps with [facility] accepted referral. Jeannene Patella with [facility] notified of possible need for home iv antibiotics.  Patient has a wheelchair and her insurance case manger is working on getting her an Clinical research associate. Address, Phone number and PCP verified.   TOC will continue to follow for needs.  Expected Discharge Plan: [place] Barriers to Discharge: Continued Medical Work up   Patient Goals and CMS Choice Patient states their goals for this hospitalization and ongoing recovery are:: return home CMS Medicare.gov Compare Post Acute Care list provided to:: Patient Choice offered to / list presented to : Patient  Expected Discharge Plan and Services Expected Discharge Plan: [place]   Discharge Planning Services: CM Consult Post Acute Care Choice: [place] arrangements for the past 2 months: Apartment                           HH Arranged: RN, PT, OT, Nurse's Aide, Social Work [employer] Agency: [place] Date [place]: 06/29/22 Time HH Agency Contacted: 18 Representative spoke with at [place]: [place] Arrangements/Services Living arrangements for the past 2 months: [place] with:: Spouse Patient language and need for interpreter reviewed:: Yes Do you feel safe going back to the place where you live?: Yes      Need for Family  Participation in Patient Care: Yes (Comment) Care giver support system in place?: Yes (comment) Current home services: DME Criminal Activity/Legal Involvement Pertinent to Current Situation/Hospitalization: No - Comment as needed  Activities of Daily Living Home Assistive Devices/Equipment: Wheelchair ADL Screening (condition at time of admission) Patient's cognitive ability adequate to safely complete daily activities?: Yes Is the patient deaf or have difficulty hearing?: No Does the patient have difficulty seeing, even when wearing glasses/contacts?: Yes Does the patient have difficulty concentrating, remembering, or making decisions?: No Patient able to express need for assistance with ADLs?: Yes Does the patient have difficulty dressing or bathing?: Yes Independently performs ADLs?: No Communication: Needs assistance Is this a change from baseline?: Pre-admission baseline Dressing (OT): Needs assistance Is this a change from baseline?: Pre-admission baseline Grooming: Needs assistance Is this a change from baseline?: Pre-admission baseline Feeding: Needs assistance Is this a change from baseline?: Pre-admission baseline Bathing: Needs assistance Is this a change from baseline?: Pre-admission baseline Toileting: Needs assistance Is this a change from baseline?: Pre-admission baseline In/Out Bed: Needs assistance Is this a change from baseline?: Pre-admission baseline Walks in Home: Needs assistance Is this a change from baseline?: Pre-admission baseline Does the patient have difficulty walking or climbing stairs?: Yes Weakness of Legs: Both Weakness of Arms/Hands: Both  Permission Sought/Granted Permission sought to share information with : Case Manager       Permission granted to share info w AGENCY:  HH        Emotional Assessment Appearance:: Appears stated age Attitude/Demeanor/Rapport: Engaged Affect (typically observed): Accepting, Appropriate, Calm Orientation:  : Oriented to Self, Oriented to Place, Oriented to  Time, Oriented to Situation Alcohol / Substance Use: Not Applicable Psych Involvement: No (comment)  Admission diagnosis:  Osteomyelitis ([place]) [M86.9] Acute cystitis without hematuria [N30.00] Other acute osteomyelitis, other site [facility]) [M86.18] Patient Active Problem List   Diagnosis Date Noted   Osteomyelitis ([place]) 06/20/2022   Estrogen deficiency 05/25/2022   CKD (chronic kidney disease) stage 5, GFR less than 15 ml/min (HCC) 02/14/2022   Prolonged QT interval 01/07/2022   Streptococcal bacteremia    Osteomyelitis of left foot ([place]) 10/16/2021   Diabetic foot ulcers ([place]) 10/16/2021   DM (diabetes mellitus), type 2 with renal complications ([place]) 82/95/6213   Portal hypertension ([place]) 08/16/2021   Eczema of scalp 06/27/2021   Hypothyroidism 06/27/2021   Pruritus 06/27/2021   Seasonal allergic rhinitis due to pollen 02/02/2021   PTSD (post-traumatic stress disorder)    Osteoarthritis    MRSA (methicillin resistant Staphylococcus aureus)    Hypertension    High cholesterol    Hepatic steatosis    GERD (gastroesophageal reflux disease)    Fibromyalgia    Episode of visual loss of left eye    Depression    Constipation    Cataract    Bulging lumbar disc    Asthma    Anxiety    Anemia    Tinea capitis 10/11/2020   Tinea corporis 10/11/2020   Hip pain 05/16/2020   IDA (iron deficiency anemia) 05/04/2020   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 ([place]) 03/16/2020   S/P lumbar fusion 03/04/2020   Spinal stenosis of lumbar region 01/22/2020   Osteomyelitis of vertebra of thoracolumbar region ([place]) 01/21/2020   Compression fracture of L1 lumbar vertebra ([place]) 11/17/2019   Fracture of multiple ribs 11/17/2019   AKI (acute kidney injury) ([place]) 11/15/2019   Nausea & vomiting 11/15/2019   Diarrhea 09/04/2019   Pressure injury of skin 04/17/2019   Septic arthritis of elbow, right ([place]) 04/17/2019    Retinopathy 04/17/2019   Renal transplant, 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   Sleep apnea 11/16/2017   Diabetic foot infection ([place]) 04/06/2017   Type 2 diabetes mellitus with hyperglycemia, with long-term current use of insulin ([place]) 04/06/2017   Neuropathy 05/05/2015   Diabetic peripheral neuropathy ([place]) 01/12/2015   CKD (chronic kidney disease), stage III ([place]) 05/27/2014   History of MI (myocardial infarction) 10/06/2013   Nausea and vomiting 10/06/2013   Obesity (BMI 30-39.9) 09/20/2013   Multinodular goiter 04/17/2013   Normocytic anemia 02/03/2013   Proximal humerus fracture 10/15/2012   CAD (coronary artery disease) 01/11/2012   UTI (urinary tract infection) 08/05/2010   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   Hyperlipidemia 06/03/2008   Essential hypertension 12/23/2006   Acute MI ([place]) 2007   PCP:  Ann Held, DO Pharmacy:   [facility], [place] [address] Phone: [phone] Fax: [phone]     Social Determinants of Health (SDOH) Interventions    Readmission Risk Interventions    06/23/2022    2:13 PM 01/23/2020    2:59 PM 11/18/2019   10:54 AM  Readmission Risk Prevention Plan  Transportation Screening Complete Complete Complete  PCP or Specialist Appt within 3-5 Days Complete    HRI or Home Care Consult Complete  Social Work Consult for [place] Planning/Counseling [place] Not Applicable    Medication Review Press photographer) Complete Complete Referral to Pharmacy  PCP or Specialist appointment within 3-5 days of discharge  Complete Complete  HRI or Home Care Consult  Complete Complete  SW Recovery Care/Counseling Consult  Complete Complete  Palliative Care Screening  Complete Not [place]  Complete Not Applicable

## 2022-06-29 NOTE — Evaluation (Signed)
Physical Therapy Evaluation  Patient Details Name: Rosella Crandell MRN: [id number] DOB: 1956-09-14 Today's Date: 06/29/2022  History of Present Illness  Pt is a 66 y.o. female s/p posterior exploration/revision of posteriuor thoracic 11- Lumbar 3, extension of fusion T9-11 with vertebral augmentation. PMH significant for spinal surgery, chronic diastolic heart failure, CKD stage IV, renal transplant, chronic DVTs, DMII, and blindness.   Clinical Impression  Pt admitted with above diagnosis. At the time of PT eval, pt was able to demonstrate bed mobility with min assist, however +2 required to scoot up towards [place]. Pt declining further mobility, expressing negative prior experience with therapies and not wanting to "over do it". Pt was educated on role of acute therapies, and precautions. Did not note a brace present in the room and RN  notified. Pt currently with functional limitations due to the deficits listed below (see PT Problem List). SNF level rehab recommended at d/c due to pt reporting husband works during the day and she will be alone at home. Anticipate pt will need to be at a mod I level prior to return home without more frequent supervision/assist due to high risk for falls and limited mobility prior to surgery. Will continue to assess for most appropriate discharge disposition as pt is agreeable to participate with PT. Acutely, pt will benefit from skilled PT to increase their independence and safety with mobility to allow discharge to the venue listed below.         Recommendations for follow up therapy are one component of a multi-disciplinary discharge planning process, led by the attending physician.  Recommendations may be updated based on patient status, additional functional criteria and insurance authorization.  Follow Up Recommendations Skilled nursing-short term rehab (<3 hours/day) Can patient physically be transported by private vehicle: No    Assistance Recommended at  Discharge Frequent or constant Supervision/Assistance  Patient can return home with the following  Two people to help with walking and/or transfers;Two people to help with bathing/dressing/bathroom;Assistance with cooking/housework;Assist for transportation;Help with stairs or ramp for entrance    Equipment Recommendations None recommended by PT  Recommendations for Other Services       Functional Status Assessment Patient has had a recent decline in their functional status and demonstrates the ability to make significant improvements in function in a reasonable and predictable amount of time.     Precautions / Restrictions Precautions Precautions: Back Precaution Booklet Issued: No Precaution Comments: Reviewing precautions this session. Will continue to educate throughout acute length of stay. Required Braces or Orthoses: Spinal Brace (no spinal brace in room, per chart TLSO required) Spinal Brace: Thoracolumbosacral orthotic (no spinal brace in room, per chart TLSO required. RN notified.) Restrictions Weight Bearing Restrictions: No Other Position/Activity Restrictions: no bending, lifting, twisting      Mobility  Bed Mobility Overal bed mobility: Needs Assistance Bed Mobility: Rolling Rolling: Min assist         General bed mobility comments: Rolling R and L with min A this session to get off bed pan and clean pt. Pt required +2 assist to reposition in the bed but did appear to be attempting to help with LE's to scoot up towards [place].    Transfers                   General transfer comment: Defer; pain and pt refusal    Ambulation/Gait                  Stairs  Wheelchair Mobility    Modified Rankin (Stroke Patients Only)       Balance Overall balance assessment:  (unable to assess this session)                                           Pertinent Vitals/Pain Pain Assessment Pain Assessment: Faces Faces Pain  Scale: Hurts whole lot Breathing: normal Negative Vocalization: none Facial Expression: smiling or inexpressive Body Language: relaxed Consolability: no need to console PAINAD Score: 0 Pain Location: operative site Pain Descriptors / Indicators: Discomfort, Operative site guarding, Aching, Constant Pain Intervention(s): Limited activity within patient's tolerance, Monitored during session, Repositioned    Home Living Family/patient expects to be discharged to:: Private residence Living Arrangements: Spouse/significant other Available Help at Discharge: Family;Available PRN/intermittently Type of Home: Other(Comment) (town house) Home Access: Level entry       Home Layout: One level Real: Grab bars - tub/shower;Rolling Environmental consultant (2 wheels);Shower seat;Transport chair Additional Comments: Husband works 3:30A-12P, home for lunch, then 1P-3:30P. When husband gone, "I have a nurse who helps me... I don't know when they're there, they're just there whenever I need them." Pt also reports neighbor helps "whenever I need"    Prior Function Prior Level of Function : Needs assist       Physical Assist : Mobility (physical);ADLs (physical)     Mobility Comments: Pt reports she was performing stand pivot transfers from bed<>wheelchair<>toilet at home using RW ADLs Comments: Pt reporting difficulty getting into bathroom to shower, but otherwise inconsistency in report of ADL prior function     Hand Dominance   Dominant Hand: Left    Extremity/Trunk Assessment   Upper Extremity Assessment Upper Extremity Assessment: Generalized weakness    Lower Extremity Assessment Lower Extremity Assessment: Generalized weakness    Cervical / Trunk Assessment Cervical / Trunk Assessment: Back Surgery  Communication   Communication: No difficulties  Cognition Arousal/Alertness: Awake/alert Behavior During Therapy: WFL for tasks assessed/performed Overall Cognitive Status: No  family/caregiver present to determine baseline cognitive functioning                                 General Comments: Pt with decreased insight into how much assistance will be required for return home. Dificulty understanding need for mobility OOB in order to return home, and refusing OOB this session. Pt recalling home health aid she had after a prior hospitalization, and describing what home was like when she had an aid rather than right before current hospitalization. Decreased safety/judgement, awareness, and problem solving.        General Comments General comments (skin integrity, edema, etc.): Fearful of falling. Reports poor experiences with therapies in the past.    Exercises     Assessment/Plan    PT Assessment Patient needs continued PT services  PT Problem List Decreased strength;Decreased activity tolerance;Decreased balance;Decreased mobility;Decreased knowledge of use of DME;Decreased safety awareness;Decreased knowledge of precautions;Pain;Decreased cognition       PT Treatment Interventions DME instruction;Gait training;Functional mobility training;Therapeutic exercise;Therapeutic activities;Balance training;Cognitive remediation;Patient/family education    PT Goals (Current goals can be found in the Care Plan section)  Acute Rehab PT Goals Patient Stated Goal: Be able to go home PT Goal Formulation: With patient Time For Goal Achievement: 07/13/22 Potential to Achieve Goals: Good    Frequency Min 5X/week  Co-evaluation               AM-PAC PT "6 Clicks" Mobility  Outcome Measure Help needed turning from your back to your side while in a flat bed without using bedrails?: A Lot Help needed moving from lying on your back to sitting on the side of a flat bed without using bedrails?: Total Help needed moving to and from a bed to a chair (including a wheelchair)?: Total Help needed standing up from a chair using your arms (e.g., wheelchair  or bedside chair)?: Total Help needed to walk in hospital room?: Total Help needed climbing 3-5 steps with a railing? : Total 6 Click Score: 7    End of Session         PT Visit Diagnosis: Unsteadiness on feet (R26.81);Pain Pain - part of body:  (back)    Time: 6294-7654 PT Time Calculation (min) (ACUTE ONLY): 31 min   Charges:   PT Evaluation $PT Eval Moderate Complexity: 1 Mod          Rolinda Roan, PT, DPT Acute Rehabilitation Services Secure Chat Preferred Office: [phone]   Thelma Comp 06/29/2022, 11:47 AM

## 2022-06-29 NOTE — Evaluation (Addendum)
Occupational Therapy Evaluation Patient Details Name: [name] [name] MRN: [id number] DOB: 1955-12-28 Today's Date: 06/29/2022   History of Present Illness Pt is a 66 y.o. female s/p posterior exploration/revision of posteriuor thoracic 11- Lumbar 3, extension of fusion T9-11 with vertebral augmentation. PMH significant for spinal surgery, chronic diastolic heart failure, CKD stage IV, renal transplant, chronic DVTs, DMII, and blindness.   Clinical Impression   PTA, pt lived with her husband and performed stand pivot transfers using RW to wheelchair. Pt with difficulty reporting level of assist for ADLs prior to this hospital admission, reporting that she used to have an aid, but has not had an aid in a long time, and describing assist aid used to provide during session rather than level of function prior to admission. Upon eval, pt performing rolling with min A, but refusing further mobility. Pt presenting with pain as well as decreased strength and activity tolerance this session. Will continue to further assess and update discharge recommendations as appropriate. Recommend discharge to SNF for continued OT services to optimize safety and independence in ADL.      Recommendations for follow up therapy are one component of a multi-disciplinary discharge planning process, led by the attending physician.  Recommendations may be updated based on patient status, additional functional criteria and insurance authorization.   Follow Up Recommendations  Skilled nursing-short term rehab (<3 hours/day)    Assistance Recommended at Discharge Frequent or constant Supervision/Assistance  Patient can return home with the following Two people to help with walking and/or transfers;Two people to help with bathing/dressing/bathroom;Assistance with cooking/housework;Assistance with feeding;Assist for transportation;Help with stairs or ramp for entrance    Functional Status Assessment  Patient has had a recent  decline in their functional status and demonstrates the ability to make significant improvements in function in a reasonable and predictable amount of time.  Equipment Recommendations  Other (comment) (defer)    Recommendations for Other Services       Precautions / Restrictions Precautions Precautions: Back Precaution Booklet Issued: No Precaution Comments: Reviewing precautions this session. Will continue to educate throughout acute length of stay. Required Braces or Orthoses: Spinal Brace (no spinal brace in room, per chart TLSO required) Spinal Brace: Thoracolumbosacral orthotic (no spinal brace in room, per chart TLSO required. RN notified.) Restrictions Weight Bearing Restrictions: No Other Position/Activity Restrictions: no bending, lifting, twisting      Mobility Bed Mobility Overal bed mobility: Needs Assistance Bed Mobility: Rolling Rolling: Min assist         General bed mobility comments: Rolling with min A this session. Refusing EOB, and reporting pain.    Transfers                   General transfer comment: Defer; pain and pt refusal      Balance Overall balance assessment:  (unable to assess this session)                                         ADL either performed or assessed with clinical judgement   ADL Overall ADL's : Needs assistance/impaired Eating/Feeding: Set up;Bed level   Grooming: Minimal assistance;Bed level   Upper Body Bathing: Minimal assistance;Bed level   Lower Body Bathing: Total assistance;Bed level;+2 for physical assistance   Upper Body Dressing : Maximal assistance;Bed level   Lower Body Dressing: Total assistance;+2 for physical assistance;Bed level     Toilet  Transfer Details (indicate cue type and reason): Not assessed. Pt refusing mobility outside of rolling off of bed pan Toileting- Clothing Manipulation and Hygiene: Total assistance;+2 for physical assistance;Bed level Toileting - Clothing  Manipulation Details (indicate cue type and reason): Pt on bed pan on arrival. Min A+2 for rolling; total A for pposterior pericare       General ADL Comments: Pt refusing OOB. Limited by insight and willingness to perform mobility. 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.) Additional Comments: No apparent new changes in vision     Perception     Praxis      Pertinent Vitals/Pain Pain Assessment Pain Assessment: Faces Faces Pain Scale: Hurts whole lot Pain Location: operative site Pain Descriptors / Indicators: Discomfort, Operative site guarding, Aching, Constant Pain Intervention(s): Limited activity within patient's tolerance, Monitored during session, Repositioned     Hand Dominance     Extremity/Trunk Assessment Upper Extremity Assessment Upper Extremity Assessment: Generalized weakness   Lower Extremity Assessment Lower Extremity Assessment: Defer to PT evaluation   Cervical / Trunk Assessment Cervical / Trunk Assessment: Back Surgery (Unable to assess due to pt refusing upright)   Communication Communication Communication: No difficulties   Cognition Arousal/Alertness: Awake/alert Behavior During Therapy: WFL for tasks assessed/performed Overall Cognitive Status: No family/caregiver present to determine baseline cognitive functioning                                 General Comments: Pt with decreased insight into how much assistance will be required for return home. Dificulty understanding need for mobility OOB in order to return home, and refusing OOB this session. Pt recalling home health aid she had after a prior hospitalization, and describing what home was like when she had an aid rather than right before current hospitalization. Decreased safety/judgement, awareness, and problem solving.     General Comments  Pt reporting she wants to return home, but refusing OOB, therefore recommending SNF. Pt  reportedly has had negative experiences with therapies in the past. OT/PT working collaboratively for rapport building this session.    Exercises     Shoulder Instructions      Home Living Family/patient expects to be discharged to:: Private residence Living Arrangements: Spouse/significant other Available Help at Discharge: Family;Available PRN/intermittently Type of Home: Other(Comment) (town house) Home Access: Level entry     [place]: One level     Bathroom Shower/Tub: Walk-in Psychologist, prison and probation services: Handicapped height     [place]: [place] (2 wheels);Shower seat;Transport chair   Additional Comments: Husband works 3:30A-12P, home for lunch, then 1P-3:30P. When husband gone, "I have a nurse who helps me... I don't know when they're there, they're just there whenever I need them." Pt also reports neighbor helps "whenever I need"      Prior Functioning/Environment Prior Level of Function : Needs assist             Mobility Comments: Pt reports she was performing stand pivot transfers from bed<>wheelchair<>toilet at home using RW ADLs Comments: Pt reporting difficulty getting into bathroom to shower, but otherwise inconsistency in report of ADL prior function        OT Problem List: Decreased strength;Decreased activity tolerance;Impaired balance (sitting and/or standing);Impaired vision/perception;Decreased safety awareness;Decreased knowledge of use of DME or AE;Decreased knowledge of precautions;Pain      OT Treatment/Interventions: Self-care/ADL  training;Therapeutic exercise;Energy conservation;DME and/or AE instruction;Therapeutic activities;Visual/perceptual remediation/compensation;Patient/family education;Balance training    OT Goals(Current goals can be found in the care plan section) Acute Rehab OT Goals Patient Stated Goal: Go home OT Goal Formulation: With patient Time For Goal Achievement: 07/13/22 Potential to  Achieve Goals: Fair  OT Frequency: Min 2X/week    Co-evaluation              AM-PAC OT "6 Clicks" Daily Activity     Outcome Measure Help from another person eating meals?: A Little Help from another person taking care of personal grooming?: A Little Help from another person toileting, which includes using toliet, bedpan, or urinal?: Total Help from another person bathing (including washing, rinsing, drying)?: A Lot Help from another person to put on and taking off regular upper body clothing?: A Lot Help from another person to put on and taking off regular lower body clothing?: Total 6 Click Score: 12   End of Session Nurse Communication: Mobility status  Activity Tolerance: Other (comment) (Limited by refusal to come to EOB and attempt mobility) Patient left: in bed;with call bell/phone within reach;with bed alarm set  OT Visit Diagnosis: Muscle weakness (generalized) (M62.81);Unsteadiness on feet (R26.81);History of falling (Z91.81);Low vision, both eyes (H54.2);Pain Pain - part of body:  (operative site)                Time: 6195-0932 OT Time Calculation (min): 29 min Charges:  OT General Charges $OT Visit: 1 Visit OT Evaluation $OT Eval Moderate Complexity: 1 Mod OT Treatments $Self Care/Home Management : 8-22 mins  Shanda Howells, OTR/L [facility] Acute Rehabilitation Office: [phone]   Lula Olszewski 06/29/2022, 11:35 AM

## 2022-06-29 NOTE — Progress Notes (Signed)
Subjective: Patient reports recovering well from surgery. She has appropriate surgical pain. NAE ON.  Objective: Vital signs in last 24 hours: Temp:  [97.6 F (36.4 C)-98.2 F (36.8 C)] 97.9 F (36.6 C) (08/31 1536) Pulse Rate:  [51-72] 71 (08/31 1536) Resp:  [12-18] 17 (08/31 1536) BP: (141-178)/(55-79) 141/55 (08/31 1536) SpO2:  [91 %-98 %] 91 % (08/31 1536) Arterial Line BP: (151)/(59) 151/59 (08/30 1845)  Intake/Output from previous day: 08/30 0701 - 08/31 0700 In: 2380 [I.V.:1750; Blood:630] Out: 9373 [Urine:530; Drains:415; Blood:400] Intake/Output this shift: No intake/output data recorded.  Physical Exam: Patient is awake, A/O X 4, conversant, and in good spirits. Eyes open spontaneously. They are in NAD and VSS. Doing well. Speech is fluent and appropriate. MAEW. BUE 5/5 throughout, BLE 4/5 proximally 4+/5 distally. Sensation to light touch is intact. PERLA, EOMI. CNs grossly intact. Dressing is clean dry intact. Incision is well approximated with no drainage, erythema, or edema. JP drain with approximately 415 ml of sanguineous  drainage.    Lab Results: Recent Labs    06/28/22 1000 06/28/22 1724 06/29/22 0408  WBC 5.2  --  8.5  HGB 8.0* 8.5* 9.9*  HCT 25.5* 25.0* 31.4*  PLT 130*  --  136*   BMET Recent Labs    06/28/22 1000 06/28/22 1724 06/29/22 0408  NA 141 141 139  K 4.2 4.5 5.3*  CL 113*  --  114*  CO2 20*  --  18*  GLUCOSE 105*  --  165*  BUN 42*  --  45*  CREATININE 3.94*  --  3.90*  CALCIUM 8.4*  --  8.3*    Studies/Results: DG Lumbar Spine 2-3 Views  Result Date: 06/28/2022 CLINICAL DATA:  Revision of posterior thoracic/lumbar spine. EXAM: LUMBAR SPINE - 2-3 VIEW COMPARISON:  MRI thoracic spine 06/22/2022. MRI lumbar spine 06/21/2022. FINDINGS: Intraoperative thoracolumbar spine. One low resolution intraoperative spot views of the thoracolumbar spine were obtained. No fracture visible on the limited views. Lower thoracic and upper lumbar  fusion hardware is visualized. Total fluoroscopy time: 3.2 Total radiation dose: Not recorded IMPRESSION: Intraoperative thoracolumbar spine. Electronically Signed   By: Ronney Asters M.D.   On: 06/28/2022 19:35   DG O-ARM IMAGE ONLY/NO REPORT  Result Date: 06/28/2022 There is no Radiologist interpretation  for this exam.  DG C-Arm 1-60 Min-No Report  Result Date: 06/28/2022 Fluoroscopy was utilized by the requesting physician.  No radiographic interpretation.   DG C-Arm 1-60 Min-No Report  Result Date: 06/28/2022 Fluoroscopy was utilized by the requesting physician.  No radiographic interpretation.    Assessment/Plan: 66 y.o. 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 She is recovering well. She has appropriate surgical pain. Neuro exam is stable and at her baseline. BLE weakness secondary to deconditioning. She has worked with PT/OT who are recommending SNF for continued convalescence. Continue working on pain control, mobility and ambulating patient.    -Continue JP drain   LOS: 9 days     Marvis Moeller, DNP, AGNP-C Neurosurgery Nurse Practitioner  [facility] [address], [address], [place], [place] [address] P: [phone]    F: [phone]  06/29/2022 4:10 PM

## 2022-06-30 DIAGNOSIS — M4625 Osteomyelitis of vertebra, thoracolumbar region: Secondary | ICD-10-CM | POA: Diagnosis not present

## 2022-06-30 DIAGNOSIS — M8618 Other acute osteomyelitis, other site: Secondary | ICD-10-CM | POA: Diagnosis not present

## 2022-06-30 DIAGNOSIS — N390 Urinary tract infection, site not specified: Secondary | ICD-10-CM | POA: Diagnosis not present

## 2022-06-30 DIAGNOSIS — N179 Acute kidney failure, unspecified: Secondary | ICD-10-CM | POA: Diagnosis not present

## 2022-06-30 DIAGNOSIS — N189 Chronic kidney disease, unspecified: Secondary | ICD-10-CM | POA: Diagnosis not present

## 2022-06-30 DIAGNOSIS — I82503 Chronic embolism and thrombosis of unspecified deep veins of lower extremity, bilateral: Secondary | ICD-10-CM | POA: Diagnosis not present

## 2022-06-30 DIAGNOSIS — N185 Chronic kidney disease, stage 5: Secondary | ICD-10-CM | POA: Diagnosis not present

## 2022-06-30 LAB — COMPREHENSIVE METABOLIC PANEL
ALT: 13 U/L (ref 0–44)
AST: 13 U/L — ABNORMAL LOW (ref 15–41)
Albumin: 2.1 g/dL — ABNORMAL LOW (ref 3.5–5.0)
Alkaline Phosphatase: 82 U/L (ref 38–126)
Anion gap: 9 (ref 5–15)
BUN: 54 mg/dL — ABNORMAL HIGH (ref 8–23)
CO2: 20 mmol/L — ABNORMAL LOW (ref 22–32)
Calcium: 8.1 mg/dL — ABNORMAL LOW (ref 8.9–10.3)
Chloride: 108 mmol/L (ref 98–111)
Creatinine, Ser: 4.39 mg/dL — ABNORMAL HIGH (ref 0.44–1.00)
GFR, Estimated: 11 mL/min — ABNORMAL LOW (ref >60)
Glucose, Bld: 115 mg/dL — ABNORMAL HIGH (ref 70–99)
Potassium: 4.1 mmol/L (ref 3.5–5.1)
Sodium: 137 mmol/L (ref 135–145)
Total Bilirubin: 0.5 mg/dL (ref 0.3–1.2)
Total Protein: 5.8 g/dL — ABNORMAL LOW (ref 6.5–8.1)

## 2022-06-30 LAB — GLUCOSE, CAPILLARY
Glucose-Capillary: 112 mg/dL — ABNORMAL HIGH (ref 70–99)
Glucose-Capillary: 107 mg/dL — ABNORMAL HIGH (ref 70–99)
Glucose-Capillary: 133 mg/dL — ABNORMAL HIGH (ref 70–99)
Glucose-Capillary: 102 mg/dL — ABNORMAL HIGH (ref 70–99)
Glucose-Capillary: 103 mg/dL — ABNORMAL HIGH (ref 70–99)

## 2022-06-30 LAB — CBC
HCT: 28 % — ABNORMAL LOW (ref 36.0–46.0)
Hemoglobin: 8.7 g/dL — ABNORMAL LOW (ref 12.0–15.0)
MCH: 28 pg (ref 26.0–34.0)
MCHC: 31.1 g/dL (ref 30.0–36.0)
MCV: 90 fL (ref 80.0–100.0)
Platelets: 138 10*3/uL — ABNORMAL LOW (ref 150–400)
RBC: 3.11 MIL/uL — ABNORMAL LOW (ref 3.87–5.11)
RDW: 17.1 % — ABNORMAL HIGH (ref 11.5–15.5)
WBC: 9.2 10*3/uL (ref 4.0–10.5)
nRBC: 0 % (ref 0.0–0.2)

## 2022-06-30 MED ORDER — ENOXAPARIN SODIUM 30 MG/0.3ML IJ SOSY
30.0000 mg | PREFILLED_SYRINGE | INTRAMUSCULAR | Status: AC
  Administered 2022-07-01 – 2022-07-02 (×2): 30 mg via SUBCUTANEOUS
  Filled 2022-06-30 (×2): qty 0.3

## 2022-06-30 NOTE — Progress Notes (Signed)
[facility] for Infectious Disease  Date of Admission:  06/20/2022           Reason for visit: Follow up on discitis/OM  Current antibiotics: Daptomycin   ASSESSMENT:    66 y.o. 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  She has required lumbar fusion hardware and has been continued on antibiotics for prolonged periods of time, however, this was subsequently stopped at an unknown time point.  She presents this admission with worsening back pain and MRI concerning for new infection.  Status post disc space aspiration with cultures growing Staph epidermidis.  She went to the OR on 06/28/2022 and underwent posterior arthrodesis T8-T11 with segmental instrumentation with T8-T10 pedicle screws connecting to previous instrumentation and removal of T11 pedicle screws and partial removal of rod.  Surgical cultures are NGTD at this time. Acute on chronic kidney disease: Creatinine continues to deteriorate. Urinary tract infection: She had some urinary symptoms on admission with a UA that was consistent with possible infection.  She received 6 days of ceftriaxone and this has since been discontinued on 06/25/2022. Reported diarrhea: Not confirmed by staff and no fevers or leukocytosis.    RECOMMENDATIONS:    Continue daptomycin per pharmacy Follow cultures Lab monitoring Wound care Will not add C diff testing for now as cannot corroborate history of diarrhea.  Continue to monitor for development of diarrhea as she could be high risk for C diff Following.  Dr. Gale Journey is available as needed over the long holiday weekend.  I will return on Tuesday.   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 sodium chloride   Intravenous Once  . amLODipine  5 mg Oral Daily  . [START ON 07/03/2022] apixaban  2.5 mg Oral BID  . carvedilol  12.5 mg Oral Daily  . Chlorhexidine Gluconate Cloth  6 each Topical Daily  . cholecalciferol  5,000 Units Oral Daily  . docusate sodium  100 mg Oral BID  . DULoxetine  60 mg Oral Daily  . enoxaparin (LOVENOX) injection  40 mg Subcutaneous Q24H  . feeding supplement (GLUCERNA SHAKE)  237 mL Oral TID BM  . folic acid  2 mg Oral Daily  . furosemide  40 mg Oral Daily  . insulin aspart  0-9 Units Subcutaneous Q4H  . insulin glargine-yfgn  8 Units Subcutaneous QHS  . mometasone-formoterol  2 puff Inhalation BID  . montelukast  10 mg Oral QHS  . multivitamin with minerals  1 tablet Oral Daily  . mupirocin cream   Topical Daily  . pregabalin  25 mg Oral TID  . sodium bicarbonate  650 mg Oral BID  . sodium chloride flush  3 mL Intravenous Q12H  . tamsulosin  0.4 mg Oral Daily   Continuous Infusions: . sodium chloride    . DAPTOmycin (CUBICIN) 500 mg in sodium chloride 0.9 % IVPB 500 mg (06/29/22 1540)   PRN Meds:.acetaminophen **OR** acetaminophen, albuterol, diazepam, hydrALAZINE, HYDROmorphone (DILAUDID) injection, lubiprostone, menthol-cetylpyridinium **OR** phenol, naLOXone (NARCAN)  injection,  ondansetron **OR** ondansetron (ZOFRAN) IV, mouth rinse, oxyCODONE, oxyCODONE, polyethylene glycol, simethicone, sodium chloride flush, sodium phosphate  SUBJECTIVE:   24 hour events:  No acute events noted overnight JP drain remained in place per neurosurgery yesterday Her operative cultures from 8/30 remain no growth to date.  Her disc aspirate cultures remain with Staph epidermidis. She continues to be on daptomycin. She has no new imaging She has no new micro since her surgery Her  creatinine continues to worsen creatinine clearance is about 12  WBC is normalized   She reports 10 diarrhea episodes overnight, however, her nurse and nurse tech report no diarrhea and they did not get report of any diarrhea either.  She continues to have back pain.   Review of Systems  All other systems reviewed and are negative.     OBJECTIVE:   Blood pressure (!) 151/59, pulse 67, temperature 97.9 F (36.6 C), temperature source Oral, resp. rate 14, height 4' 11"  (1.499 m), weight 87.5 kg, SpO2 98 %. Body mass index is 38.96 kg/m.  Physical Exam Constitutional:      Appearance: Normal appearance.  HENT:     Head: Normocephalic and atraumatic.  Eyes:     Extraocular Movements: Extraocular movements intact.     Conjunctiva/sclera: Conjunctivae normal.  Abdominal:     General: There is no distension.     Palpations: Abdomen is soft.  Musculoskeletal:     Cervical back: Normal range of motion and neck supple.  Skin:    General: Skin is warm and dry.  Neurological:     General: No focal deficit present.     Mental Status: She is alert and oriented to person, place, and time.  Psychiatric:        Mood and Affect: Mood normal.        Behavior: Behavior normal.      Lab Results: Lab Results  Component Value Date   WBC 9.2 06/30/2022   HGB 8.7 (L) 06/30/2022   HCT 28.0 (L) 06/30/2022   MCV 90.0 06/30/2022   PLT 138 (L) 06/30/2022    Lab Results  Component Value Date   NA 137 06/30/2022   K 4.1 06/30/2022   CO2 20 (L) 06/30/2022   GLUCOSE 115 (H) 06/30/2022   BUN 54 (H) 06/30/2022   CREATININE 4.39 (H) 06/30/2022   CALCIUM 8.1 (L) 06/30/2022   GFRNONAA 11 (L) 06/30/2022   GFRAA 31 (L) 06/16/2020    Lab Results  Component Value Date   ALT 13 06/30/2022   AST 13 (L) 06/30/2022   ALKPHOS 82 06/30/2022   BILITOT 0.5 06/30/2022       Component Value Date/Time   CRP 2.0 (H) 06/20/2022 1813       Component Value Date/Time   ESRSEDRATE 126 (H)  06/20/2022 1813     I have reviewed the micro and lab results in Epic.  Imaging: DG Lumbar Spine 2-3 Views  Result Date: 06/28/2022 CLINICAL DATA:  Revision of posterior thoracic/lumbar spine. EXAM: LUMBAR SPINE - 2-3 VIEW COMPARISON:  MRI thoracic spine 06/22/2022. MRI lumbar spine 06/21/2022. FINDINGS: Intraoperative thoracolumbar spine. One low resolution intraoperative spot views of the thoracolumbar spine were obtained. No fracture visible on the limited views. Lower thoracic and upper lumbar fusion hardware is visualized. Total fluoroscopy time: 3.2 Total radiation dose: Not recorded IMPRESSION: Intraoperative thoracolumbar spine. Electronically Signed   By: Ronney Asters M.D.   On: 06/28/2022 19:35   DG O-ARM IMAGE ONLY/NO REPORT  Result Date: 06/28/2022 There  is no Radiologist interpretation  for this exam.  DG C-Arm 1-60 Min-No Report  Result Date: 06/28/2022 Fluoroscopy was utilized by the requesting physician.  No radiographic interpretation.   DG C-Arm 1-60 Min-No Report  Result Date: 06/28/2022 Fluoroscopy was utilized by the requesting physician.  No radiographic interpretation.     Imaging independently reviewed in Epic.    Raynelle Highland for Infectious Disease [facility] [phone] pager 06/30/2022, 8:06 AM

## 2022-06-30 NOTE — Progress Notes (Signed)
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  Back pain is usually chronic but worsened.  She has been following with neurosurgery as outpatient, she has extensive vertebral damages that has been under investigation.  Work-up is revealed negative abdominal pathology on CT however she does have T10-11 disc space infection.   Patient underwent  posterior arthrodesis T8-T11 with segmental instrumentation with T8-T10 pedicle screws connecting to previous instrumentation and removal of T11 pedicle screws and partial removal of rod.   Subjective   Patient seen and examined, still has pain from surgery.   Assessment/Plan:    Acute kidney injury on CKD stage IV -Creatinine baseline 2.6-3 -Creatinine has improved to 3.94 -Creatinine had peaked at 4.89, nephrology was consulted -Chart review documented patient has history of renal transplant several years ago -Patient is status post renal transplant 50 years ago -She is not immunocompromised and not on any immunosuppressants -But per nephrology, patient never had renal transplant.  She is not on any immunosuppressants.  She does not have a scar in the abdomen to suggest transplant.    T10/T11 discitis/vertebral osteomyelitis -History of vertebral osteomyelitis/back surgeries -Presented with worsening back pain for past 2 to 3 weeks -CT on admission showed discitis/osteomyelitis of T10/T11 -Neurosurgery, ID and IR were consulted -On 8/25 patient underwent disc  aspiration per IR -Started on daptomycin and ceftriaxone per ID -Neurosurgery saw patient and recommended transfer to [facility] -Patient came to [facility] and plan for surgery -Patient underwent surgery yesterday with posterior arthrodesis T8-T11 with segmental instrumentation with T8-T10 pedicle screws connecting to previous instrumentation and removal of T11 pedicle screws and partial removal of rod. -Continue pain control with IV Dilaudid  Chronic DVT -Patient was taking Eliquis which is currently on hold since 06/18/2022 -Underwent surgery yesterday -Started on Eliquis 2.5 mg p.o. twice daily  Diabetes mellitus type 2 -Continue sliding scale insulin NovoLog -Continue Lantus 8 units subcu daily -CBG well controlled  HFpEF -Patient appears euvolemic at this time -Continue Lasix 40 mg p.o. daily  Anemia of chronic disease -Hemoglobin at baseline  Anxiety/depression -Continue Cymbalta daily -Continue Valium as needed  Hyperkalemia -Potassium was mildly  elevated at 5.3 -1 dose of Lokelma 5 g p.o. given yesterday -Potassium is 4.1 this morning   Medications     sodium chloride   Intravenous Once   amLODipine  5 mg Oral Daily   [START ON 07/03/2022] apixaban  2.5 mg Oral BID   carvedilol  12.5 mg Oral Daily   Chlorhexidine Gluconate Cloth  6 each Topical Daily   cholecalciferol  5,000 Units Oral Daily   docusate sodium  100 mg Oral BID   DULoxetine  60 mg Oral Daily   enoxaparin (LOVENOX) injection  40 mg Subcutaneous Q24H   feeding supplement (GLUCERNA SHAKE)  237 mL Oral TID BM   folic acid  2 mg Oral Daily   furosemide  40 mg Oral Daily   insulin aspart  0-9 Units Subcutaneous Q4H   insulin glargine-yfgn  8 Units Subcutaneous QHS   mometasone-formoterol  2 puff Inhalation  BID   montelukast  10 mg Oral QHS   multivitamin with minerals  1 tablet Oral Daily   mupirocin cream   Topical Daily   pregabalin  25 mg Oral TID   sodium bicarbonate  650 mg Oral BID    sodium chloride flush  3 mL Intravenous Q12H   tamsulosin  0.4 mg Oral Daily     Data Reviewed:   CBG:  Recent Labs  Lab 06/29/22 2330 06/30/22 0357 06/30/22 0836 06/30/22 1225 06/30/22 1600  GLUCAP 124* 112* 107* 133* 102*    SpO2: 95 % O2 Flow Rate (L/min): 1 L/min    Vitals:   06/30/22 0739 06/30/22 0833 06/30/22 1221 06/30/22 1556  BP:  130/66 (!) 149/68 (!) 119/54  Pulse:  72 69 71  Resp:  17 19 15   Temp:  98.3 F (36.8 C) 98 F (36.7 C) 97.9 F (36.6 C)  TempSrc:  Oral Oral Oral  SpO2: 98% 97% 95% 95%  Weight:      Height:          Data Reviewed:  Basic Metabolic Panel: Recent Labs  Lab 06/24/22 0602 06/25/22 0604 06/26/22 0457 06/27/22 0638 06/28/22 1000 06/28/22 1724 06/29/22 0408 06/30/22 0614  NA 141   < > 140 139 141 141 139 137  K 4.4   < > 3.4* 4.1 4.2 4.5 5.3* 4.1  CL 120*   < > 114* 115* 113*  --  114* 108  CO2 16*   < > 20* 18* 20*  --  18* 20*  GLUCOSE 127*   < > 97 89 105*  --  165* 115*  BUN 58*   < > 45* 43* 42*  --  45* 54*  CREATININE 4.48*   < > 3.49* 3.55* 3.94*  --  3.90* 4.39*  CALCIUM 8.0*   < > 8.2* 8.5* 8.4*  --  8.3* 8.1*  PHOS 5.1*  --   --   --   --   --   --   --    < > = values in this interval not displayed.    CBC: Recent Labs  Lab 06/26/22 0457 06/27/22 0830 06/28/22 1000 06/28/22 1724 06/29/22 0408 06/30/22 0614  WBC 6.6 5.5 5.2  --  8.5 9.2  HGB 8.1* 8.1* 8.0* 8.5* 9.9* 8.7*  HCT 26.3* 27.0* 25.5* 25.0* 31.4* 28.0*  MCV 88.9 90.3 88.5  --  88.7 90.0  PLT 148* 120* 130*  --  136* 138*    LFT Recent Labs  Lab 06/24/22 0602 06/29/22 0408 06/30/22 0614  AST  --  16 13*  ALT  --  13 13  ALKPHOS  --  86 82  BILITOT  --  0.6 0.5  PROT  --  6.4* 5.8*  ALBUMIN 2.3* 2.2* 2.1*     Antibiotics: Anti-infectives (From admission, onward)    Start     Dose/Rate Route Frequency Ordered Stop   06/28/22 1738  vancomycin (VANCOCIN) powder  Status:  Discontinued          As needed 06/28/22 1739  06/28/22 1823   06/28/22 1504  clindamycin (CLEOCIN) 900 MG/50ML IVPB       Note to Pharmacy: [facility] E: cabinet override      06/28/22 1504 06/28/22 1508   06/23/22 1600  DAPTOmycin (CUBICIN) 500 mg in sodium chloride 0.9 % IVPB        8 mg/kg  59.6 kg (Adjusted) 120 mL/hr over 30 Minutes Intravenous Every 48  hours 06/23/22 1346     06/23/22 1500  DAPTOmycin (CUBICIN) 500 mg in sodium chloride 0.9 % IVPB  Status:  Discontinued        8 mg/kg  59.6 kg (Adjusted) 120 mL/hr over 30 Minutes Intravenous Daily 06/23/22 1339 06/23/22 1346   06/21/22 2200  cefTRIAXone (ROCEPHIN) 2 g in sodium chloride 0.9 % 100 mL IVPB  Status:  Discontinued        2 g 200 mL/hr over 30 Minutes Intravenous Every 24 hours 06/21/22 0025 06/26/22 1454   06/21/22 0108  vancomycin variable dose per unstable renal function (pharmacist dosing)  Status:  Discontinued         Does not apply See admin instructions 06/21/22 0108 06/21/22 0906   06/21/22 0045  vancomycin (VANCOREADY) IVPB 1500 mg/300 mL        1,500 mg 150 mL/hr over 120 Minutes Intravenous  Once 06/21/22 0031 06/21/22 0255   06/20/22 2145  cefTRIAXone (ROCEPHIN) 2 g in sodium chloride 0.9 % 100 mL IVPB        2 g 200 mL/hr over 30 Minutes Intravenous  Once 06/20/22 2133 06/20/22 2312        DVT prophylaxis: Lovenox for 4 days then Eliquis to be started from  07/03/2022  Code Status: Full code  Family Communication:    CONSULTS neurosurgery, infectious disease   Objective    Physical Examination:  General-appears in no acute distress Heart-S1-S2, regular, no murmur auscultated Lungs-clear to auscultation bilaterally, no wheezing or crackles auscultated Abdomen-soft, nontender, no organomegaly Extremities-no edema in the lower extremities Neuro-alert, oriented x3, no focal deficit noted  Status is: Inpatient:       Oswald Hillock   [facility] If 7PM-7AM, please contact night-coverage at [email or web address], Office   [phone]   06/30/2022, 4:16 PM  LOS: 10 days

## 2022-06-30 NOTE — Progress Notes (Signed)
Subjective: Patient reports mid back pain. NAE ON.  Objective: Vital signs in last 24 hours: Temp:  [97.7 F (36.5 C)-98.3 F (36.8 C)] 97.9 F (36.6 C) (09/01 0332) Pulse Rate:  [67-78] 67 (09/01 0332) Resp:  [14-17] 14 (09/01 0332) BP: (111-154)/(54-65) 151/59 (09/01 0332) SpO2:  [91 %-99 %] 98 % (09/01 0739)  Intake/Output from previous day: 08/31 0701 - 09/01 0700 In: -  Out: 670 [Urine:600; Drains:70] Intake/Output this shift: No intake/output data recorded.  Physical Exam: Patient is awake, A/O X 4, conversant, and in good spirits. Eyes open spontaneously. They are in NAD and VSS. Doing well. Speech is fluent and appropriate. MAEW. BUE 5/5 throughout, BLE 4/5 proximally 4+/5 distally. Sensation to light touch is intact. PERLA, EOMI. CNs grossly intact. Dressing is clean dry intact. Incision is well approximated with no drainage, erythema, or edema. JP drain with approximately 70 ml of serosanguineous  drainage.  Lab Results: Recent Labs    06/29/22 0408 06/30/22 0614  WBC 8.5 9.2  HGB 9.9* 8.7*  HCT 31.4* 28.0*  PLT 136* 138*   BMET Recent Labs    06/29/22 0408 06/30/22 0614  NA 139 137  K 5.3* 4.1  CL 114* 108  CO2 18* 20*  GLUCOSE 165* 115*  BUN 45* 54*  CREATININE 3.90* 4.39*  CALCIUM 8.3* 8.1*    Studies/Results: DG Lumbar Spine 2-3 Views  Result Date: 06/28/2022 CLINICAL DATA:  Revision of posterior thoracic/lumbar spine. EXAM: LUMBAR SPINE - 2-3 VIEW COMPARISON:  MRI thoracic spine 06/22/2022. MRI lumbar spine 06/21/2022. FINDINGS: Intraoperative thoracolumbar spine. One low resolution intraoperative spot views of the thoracolumbar spine were obtained. No fracture visible on the limited views. Lower thoracic and upper lumbar fusion hardware is visualized. Total fluoroscopy time: 3.2 Total radiation dose: Not recorded IMPRESSION: Intraoperative thoracolumbar spine. Electronically Signed   By: Ronney Asters M.D.   On: 06/28/2022 19:35   DG O-ARM IMAGE  ONLY/NO REPORT  Result Date: 06/28/2022 There is no Radiologist interpretation  for this exam.  DG C-Arm 1-60 Min-No Report  Result Date: 06/28/2022 Fluoroscopy was utilized by the requesting physician.  No radiographic interpretation.   DG C-Arm 1-60 Min-No Report  Result Date: 06/28/2022 Fluoroscopy was utilized by the requesting physician.  No radiographic interpretation.    Assessment/Plan: 66 y.o. female who is POD #2 s/p posterior exploration  and revision of T11-L3 fusion with extension of posterior arthrodesis T8-9, T9-10, T10-11 due to T10-T11 discitis and osteomyelitis with proximal junctional kyphosis. She is continuing to recover well. She has appropriate surgical pain. Neuro exam is stable. BLE weakness secondary to deconditioning. PT/OT recommending SNF for continued convalescence. Continue working on pain control, mobility and ambulating patient.   -Continue JP drain -Resume Eliquis 07/05/2022 (1 week postop)   LOS: 10 days     Marvis Moeller 06/30/2022, 8:07 AM

## 2022-06-30 NOTE — Progress Notes (Addendum)
Physical Therapy Treatment Patient Details Name: [name] [name] MRN: [id number] DOB: July 29, 1956 Today's Date: 06/30/2022   History of Present Illness Pt is a 66 y.o. female s/p posterior exploration/revision of posteriuor thoracic 11- Lumbar 3, extension of fusion T9-11 with vertebral augmentation. PMH significant for spinal surgery, chronic diastolic heart failure, CKD stage IV, renal transplant, chronic DVTs, DMII, and blindness.    PT Comments    Pt continues to report increased pain. RN notified and present to provide pain meds at end of session. Pt with bowel incontinence in the bed, and focus of session was bed mobility for peri-care and linen change. Brace not present in room. Pt reports husband will bring it for her tonight. As pt was very fatigued and painful after bed mobility, pt was positioned back in the bed as she reports she cannot tolerate attempts at EOB at this time. Will continue to follow and progress as able per POC. Based on performance today, SNF level rehab remains the most appropriate post-acute disposition.    Recommendations for follow up therapy are one component of a multi-disciplinary discharge planning process, led by the attending physician.  Recommendations may be updated based on patient status, additional functional criteria and insurance authorization.  Follow Up Recommendations  Skilled nursing-short term rehab (<3 hours/day) Can patient physically be transported by private vehicle: No   Assistance Recommended at Discharge Frequent or constant Supervision/Assistance  Patient can return home with the following Two people to help with walking and/or transfers;Two people to help with bathing/dressing/bathroom;Assistance with cooking/housework;Assist for transportation;Help with stairs or ramp for entrance   Equipment Recommendations  None recommended by PT    Recommendations for Other Services       Precautions / Restrictions Precautions Precautions:  Back Precaution Booklet Issued: No Precaution Comments: Reviewed precautions during functional mobility. Pt with low vision. Required Braces or Orthoses: Spinal Brace Spinal Brace: Thoracolumbosacral orthotic (Not present - per pt husband is to bring from home) Restrictions Weight Bearing Restrictions: No     Mobility  Bed Mobility Overal bed mobility: Needs Assistance Bed Mobility: Rolling Rolling: Min assist         General bed mobility comments: Rolling R and L with min A this session to allow for peri-care and linen change. Pt with increased pain and very fatigued after this. Not able to progress mobility further.    Transfers                   General transfer comment: Not assessed    Ambulation/Gait               General Gait Details: Not assessed   Stairs             Wheelchair Mobility    Modified Rankin (Stroke Patients Only)       Balance Overall balance assessment:  (unable to assess this session)                                          Cognition Arousal/Alertness: Awake/alert Behavior During Therapy: WFL for tasks assessed/performed Overall Cognitive Status: No family/caregiver present to determine baseline cognitive functioning                                          Exercises  General Comments General comments (skin integrity, edema, etc.): Fearful of falling      Pertinent Vitals/Pain Pain Assessment Pain Assessment: Faces Faces Pain Scale: Hurts whole lot Pain Location: operative site Pain Descriptors / Indicators: Discomfort, Operative site guarding, Aching, Constant Pain Intervention(s): Limited activity within patient's tolerance, Monitored during session, Repositioned    Home Living                          Prior Function            PT Goals (current goals can now be found in the care plan section) Acute Rehab PT Goals Patient Stated Goal: Be able to go  home PT Goal Formulation: With patient Time For Goal Achievement: 07/13/22 Potential to Achieve Goals: Good Progress towards PT goals: Progressing toward goals    Frequency    Min 5X/week      PT Plan Current plan remains appropriate    Co-evaluation              AM-PAC PT "6 Clicks" Mobility   Outcome Measure  Help needed turning from your back to your side while in a flat bed without using bedrails?: A Lot Help needed moving from lying on your back to sitting on the side of a flat bed without using bedrails?: Total Help needed moving to and from a bed to a chair (including a wheelchair)?: Total Help needed standing up from a chair using your arms (e.g., wheelchair or bedside chair)?: Total Help needed to walk in hospital room?: Total Help needed climbing 3-5 steps with a railing? : Total 6 Click Score: 7    End of Session         PT Visit Diagnosis: Unsteadiness on feet (R26.81);Pain Pain - part of body:  (back)     Time: 4196-2229 PT Time Calculation (min) (ACUTE ONLY): 22 min  Charges:  $Gait Training: 8-22 mins                     Rolinda Roan, PT, DPT Acute Rehabilitation Services Secure Chat Preferred Office: [phone]    [name] [name] 06/30/2022, 1:42 PM

## 2022-06-30 NOTE — Progress Notes (Signed)
Orthopedic Tech Progress Note Patient Details:  [name] [name] 1956-06-03 [id number]  Patient ID: [name], adult   DOB: 02/16/1956, 66 y.o.   MRN: [id number] Spoke with RN and patients husband is bringing her TLSO this afternoon. [name] 06/30/2022, 10:20 AM

## 2022-06-30 NOTE — Telephone Encounter (Signed)
Left vm to return call.

## 2022-07-01 DIAGNOSIS — I82503 Chronic embolism and thrombosis of unspecified deep veins of lower extremity, bilateral: Secondary | ICD-10-CM | POA: Diagnosis not present

## 2022-07-01 DIAGNOSIS — N179 Acute kidney failure, unspecified: Secondary | ICD-10-CM | POA: Diagnosis not present

## 2022-07-01 DIAGNOSIS — N185 Chronic kidney disease, stage 5: Secondary | ICD-10-CM | POA: Diagnosis not present

## 2022-07-01 DIAGNOSIS — M8618 Other acute osteomyelitis, other site: Secondary | ICD-10-CM | POA: Diagnosis not present

## 2022-07-01 LAB — CK: Total CK: 51 U/L (ref 38–234)

## 2022-07-01 LAB — COMPREHENSIVE METABOLIC PANEL
ALT: 11 U/L (ref 0–44)
AST: 13 U/L — ABNORMAL LOW (ref 15–41)
Albumin: 2.1 g/dL — ABNORMAL LOW (ref 3.5–5.0)
Alkaline Phosphatase: 78 U/L (ref 38–126)
Anion gap: 5 (ref 5–15)
BUN: 58 mg/dL — ABNORMAL HIGH (ref 8–23)
CO2: 22 mmol/L (ref 22–32)
Calcium: 8.3 mg/dL — ABNORMAL LOW (ref 8.9–10.3)
Chloride: 109 mmol/L (ref 98–111)
Creatinine, Ser: 4.88 mg/dL — ABNORMAL HIGH (ref 0.44–1.00)
GFR, Estimated: 9 mL/min — ABNORMAL LOW (ref >60)
Glucose, Bld: 91 mg/dL (ref 70–99)
Potassium: 4.2 mmol/L (ref 3.5–5.1)
Sodium: 136 mmol/L (ref 135–145)
Total Bilirubin: 0.6 mg/dL (ref 0.3–1.2)
Total Protein: 6 g/dL — ABNORMAL LOW (ref 6.5–8.1)

## 2022-07-01 LAB — CBC
HCT: 27.6 % — ABNORMAL LOW (ref 36.0–46.0)
Hemoglobin: 8.5 g/dL — ABNORMAL LOW (ref 12.0–15.0)
MCH: 27.8 pg (ref 26.0–34.0)
MCHC: 30.8 g/dL (ref 30.0–36.0)
MCV: 90.2 fL (ref 80.0–100.0)
Platelets: 128 10*3/uL — ABNORMAL LOW (ref 150–400)
RBC: 3.06 MIL/uL — ABNORMAL LOW (ref 3.87–5.11)
RDW: 17.1 % — ABNORMAL HIGH (ref 11.5–15.5)
WBC: 6.5 10*3/uL (ref 4.0–10.5)
nRBC: 0 % (ref 0.0–0.2)

## 2022-07-01 LAB — GLUCOSE, CAPILLARY
Glucose-Capillary: 104 mg/dL — ABNORMAL HIGH (ref 70–99)
Glucose-Capillary: 78 mg/dL (ref 70–99)
Glucose-Capillary: 108 mg/dL — ABNORMAL HIGH (ref 70–99)
Glucose-Capillary: 102 mg/dL — ABNORMAL HIGH (ref 70–99)
Glucose-Capillary: 143 mg/dL — ABNORMAL HIGH (ref 70–99)

## 2022-07-01 NOTE — Progress Notes (Signed)
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  Back pain is usually chronic but worsened.  She has been following with neurosurgery as outpatient, she has extensive vertebral damages that has been under investigation.  Work-up is revealed negative abdominal pathology on CT however she does have T10-11 disc space infection.   Patient underwent  posterior arthrodesis T8-T11 with segmental instrumentation with T8-T10 pedicle screws connecting to previous instrumentation and removal of T11 pedicle screws and partial removal of rod.   Subjective   Patient seen and examined, still complains of pain in back.   Assessment/Plan:    Acute kidney injury on CKD stage IV -Creatinine baseline 2.6-3 -Creatinine has improved to 3.94 -Creatinine had peaked at 4.89, nephrology was consulted -Chart review documented patient has history of renal transplant several years ago -Patient is status post renal transplant 50 years ago -She is not immunocompromised and not on any immunosuppressants -But per nephrology, patient never had renal transplant.  She is not on any immunosuppressants.  She does not have a scar in the abdomen to suggest transplant.    T10/T11 discitis/vertebral osteomyelitis -History of vertebral osteomyelitis/back surgeries -Presented with worsening back pain for past 2 to 3 weeks -CT on admission showed discitis/osteomyelitis of T10/T11 -Neurosurgery, ID and IR were consulted -On 8/25 patient underwent disc  aspiration per IR -Started on daptomycin and ceftriaxone per ID; ceftriaxone was discontinued, continue daptomycin -Neurosurgery saw patient and recommended transfer to [facility] -Patient came to [facility] and plan for surgery -Patient underwent surgery yesterday with posterior arthrodesis T8-T11 with segmental instrumentation with T8-T10 pedicle screws connecting to previous instrumentation and removal of T11 pedicle screws and partial removal of rod. -Continue pain control with IV Dilaudid  Chronic DVT -Patient was taking Eliquis which is currently on hold since 06/18/2022 -Underwent surgery yesterday -Currently on Lovenox for 4 days then will start Eliquis from 07/03/2022  Diabetes mellitus type 2 -Continue sliding scale insulin NovoLog -Continue Lantus 8 units subcu daily -CBG well controlled  HFpEF -Patient appears euvolemic at this time -Continue Lasix 40 mg p.o. daily  Anemia of chronic disease -Hemoglobin at baseline  Anxiety/depression -Continue Cymbalta daily -Continue Valium as needed  Hyperkalemia -Resolved -Potassium was mildly  elevated at 5.3 -1 dose of Lokelma 5 g p.o. given    Medications     sodium chloride   Intravenous Once   amLODipine  5 mg Oral Daily   [START ON 07/03/2022] apixaban  2.5 mg Oral BID   carvedilol  12.5 mg Oral Daily   Chlorhexidine Gluconate Cloth  6 each Topical Daily   cholecalciferol  5,000 Units Oral Daily   docusate sodium  100 mg Oral BID   DULoxetine  60 mg Oral Daily   enoxaparin (LOVENOX) injection  30 mg Subcutaneous Q24H   feeding supplement (GLUCERNA SHAKE)  237 mL Oral TID BM   folic acid  2 mg Oral Daily   furosemide  40 mg Oral Daily   insulin aspart  0-9 Units Subcutaneous Q4H   insulin glargine-yfgn  8 Units Subcutaneous QHS  mometasone-formoterol  2 puff Inhalation BID   montelukast  10 mg Oral QHS   multivitamin with minerals  1 tablet Oral Daily   mupirocin cream   Topical Daily   pregabalin  25 mg Oral  TID   sodium bicarbonate  650 mg Oral BID   sodium chloride flush  3 mL Intravenous Q12H   tamsulosin  0.4 mg Oral Daily     Data Reviewed:   CBG:  Recent Labs  Lab 06/30/22 1225 06/30/22 1600 06/30/22 1958 07/01/22 0058 07/01/22 0806  GLUCAP 133* 102* 103* 104* 78    SpO2: 92 % O2 Flow Rate (L/min): 2 L/min    Vitals:   07/01/22 0053 07/01/22 0408 07/01/22 0805 07/01/22 0901  BP: 132/61 (!) 131/39 (!) 132/54   Pulse: 66 99 65   Resp: 14 16 14    Temp: 97.9 F (36.6 C) 97.7 F (36.5 C) 97.7 F (36.5 C)   TempSrc: Oral Oral Oral   SpO2: 97% 97% 91% 92%  Weight:      Height:          Data Reviewed:  Basic Metabolic Panel: Recent Labs  Lab 06/27/22 0638 06/28/22 1000 06/28/22 1724 06/29/22 0408 06/30/22 0614 07/01/22 0500  NA 139 141 141 139 137 136  K 4.1 4.2 4.5 5.3* 4.1 4.2  CL 115* 113*  --  114* 108 109  CO2 18* 20*  --  18* 20* 22  GLUCOSE 89 105*  --  165* 115* 91  BUN 43* 42*  --  45* 54* 58*  CREATININE 3.55* 3.94*  --  3.90* 4.39* 4.88*  CALCIUM 8.5* 8.4*  --  8.3* 8.1* 8.3*    CBC: Recent Labs  Lab 06/27/22 0830 06/28/22 1000 06/28/22 1724 06/29/22 0408 06/30/22 0614 07/01/22 0500  WBC 5.5 5.2  --  8.5 9.2 6.5  HGB 8.1* 8.0* 8.5* 9.9* 8.7* 8.5*  HCT 27.0* 25.5* 25.0* 31.4* 28.0* 27.6*  MCV 90.3 88.5  --  88.7 90.0 90.2  PLT 120* 130*  --  136* 138* 128*    LFT Recent Labs  Lab 06/29/22 0408 06/30/22 0614 07/01/22 0500  AST 16 13* 13*  ALT 13 13 11   ALKPHOS 86 82 78  BILITOT 0.6 0.5 0.6  PROT 6.4* 5.8* 6.0*  ALBUMIN 2.2* 2.1* 2.1*     Antibiotics: Anti-infectives (From admission, onward)    Start     Dose/Rate Route Frequency Ordered Stop   06/28/22 1738  vancomycin (VANCOCIN) powder  Status:  Discontinued          As needed 06/28/22 1739 06/28/22 1823   06/28/22 1504  clindamycin (CLEOCIN) 900 MG/50ML IVPB       Note to Pharmacy: Rush Farmer E: cabinet override      06/28/22 1504 06/28/22 1508   06/23/22 1600   DAPTOmycin (CUBICIN) 500 mg in sodium chloride 0.9 % IVPB        8 mg/kg  59.6 kg (Adjusted) 120 mL/hr over 30 Minutes Intravenous Every 48 hours 06/23/22 1346     06/23/22 1500  DAPTOmycin (CUBICIN) 500 mg in sodium chloride 0.9 % IVPB  Status:  Discontinued        8 mg/kg  59.6 kg (Adjusted) 120 mL/hr over 30 Minutes Intravenous Daily 06/23/22 1339 06/23/22 1346   06/21/22 2200  cefTRIAXone (ROCEPHIN) 2 g in sodium chloride 0.9 % 100 mL IVPB  Status:  Discontinued        2 g 200 mL/hr over 30 Minutes Intravenous  Every 24 hours 06/21/22 0025 06/26/22 1454   06/21/22 0108  vancomycin variable dose per unstable renal function (pharmacist dosing)  Status:  Discontinued         Does not apply See admin instructions 06/21/22 0108 06/21/22 0906   06/21/22 0045  vancomycin (VANCOREADY) IVPB 1500 mg/300 mL        1,500 mg 150 mL/hr over 120 Minutes Intravenous  Once 06/21/22 0031 06/21/22 0255   06/20/22 2145  cefTRIAXone (ROCEPHIN) 2 g in sodium chloride 0.9 % 100 mL IVPB        2 g 200 mL/hr over 30 Minutes Intravenous  Once 06/20/22 2133 06/20/22 2312        DVT prophylaxis: Lovenox for 4 days then Eliquis to be started from  07/03/2022  Code Status: Full code  Family Communication:    CONSULTS neurosurgery, infectious disease   Objective    Physical Examination:  General-appears in no acute distress Heart-S1-S2, regular, no murmur auscultated Lungs-clear to auscultation bilaterally, no wheezing or crackles auscultated Abdomen-soft, nontender, no organomegaly Extremities-no edema in the lower extremities Neuro-alert, oriented x3, no focal deficit noted Status is: Inpatient:       Oswald Hillock   [facility] If 7PM-7AM, please contact night-coverage at [email or web address], Office  [phone]   07/01/2022, 10:16 AM  LOS: 11 days

## 2022-07-01 NOTE — Progress Notes (Signed)
Patient progressing reasonably well.  Still with quite a bit of incisional pain.  No radiating pain numbness or weakness.  Afebrile.  Her vital signs are stable.  Urine output is good.  Her drain output is moderate.  She is awake and alert.  She is obviously uncomfortable.  Her motor and sensory function are intact.  Her wound is healing well.  Abdomen soft.  Progressing reasonably well.  Continue efforts at pain control and mobilization.  Continue IV antibiotic coverage for now although cultures remain no growth.  Plan to remove drain tomorrow.

## 2022-07-01 NOTE — Progress Notes (Signed)
PT Cancellation Note  Patient Details Name: [name] [name] MRN: [id number] DOB: 1956-08-05   Cancelled Treatment:    Reason Eval/Treat Not Completed: Other (comment).  Patient sleeping and husband requested to let her sleep.   Had checked on patient earlier in day and declined at that time stating she had been up and moving in the room and was too fatigued. Will attempted again tomorrow.   Shanna Cisco 07/01/2022, 3:04 PM

## 2022-07-02 DIAGNOSIS — N3000 Acute cystitis without hematuria: Secondary | ICD-10-CM | POA: Diagnosis not present

## 2022-07-02 DIAGNOSIS — M8618 Other acute osteomyelitis, other site: Secondary | ICD-10-CM | POA: Diagnosis not present

## 2022-07-02 DIAGNOSIS — N179 Acute kidney failure, unspecified: Secondary | ICD-10-CM | POA: Diagnosis not present

## 2022-07-02 LAB — GLUCOSE, CAPILLARY
Glucose-Capillary: 104 mg/dL — ABNORMAL HIGH (ref 70–99)
Glucose-Capillary: 107 mg/dL — ABNORMAL HIGH (ref 70–99)
Glucose-Capillary: 112 mg/dL — ABNORMAL HIGH (ref 70–99)
Glucose-Capillary: 132 mg/dL — ABNORMAL HIGH (ref 70–99)
Glucose-Capillary: 89 mg/dL (ref 70–99)
Glucose-Capillary: 92 mg/dL (ref 70–99)
Glucose-Capillary: 95 mg/dL (ref 70–99)
Glucose-Capillary: 98 mg/dL (ref 70–99)

## 2022-07-02 LAB — MIC RESULT

## 2022-07-02 LAB — MINIMUM INHIBITORY CONC. (1 DRUG)

## 2022-07-02 MED ORDER — SODIUM CHLORIDE 0.9 % IV SOLN: INTRAVENOUS | Status: AC

## 2022-07-02 MED ORDER — LOPERAMIDE HCL 2 MG PO CAPS
2.0000 mg | ORAL_CAPSULE | ORAL | Status: AC | PRN
  Administered 2022-07-02 – 2022-07-03 (×2): 2 mg via ORAL
  Filled 2022-07-02 (×2): qty 1

## 2022-07-02 NOTE — Progress Notes (Signed)
Occupational Therapy Treatment Patient Details Name: [name] [name] MRN: [id number] DOB: Apr 24, 1956 Today's Date: 07/02/2022   History of present illness Pt is a 66 y.o. female s/p posterior exploration/revision of posteriuor thoracic 11- Lumbar 3, extension of fusion T9-11 with vertebral augmentation. PMH significant for spinal surgery, chronic diastolic heart failure, CKD stage IV, renal transplant, chronic DVTs, DMII, and blindness.   OT comments  Patient received in bed and agreeable to OT/PT session. Patient required cues for encouragement to get OOB but was agreeable due to history of having rehab and understands importance of mobility. Patient educated on log rolling and mod assist to 2 to get to EOB with assistance for sitting balance and to donn back brace. 2 person HHA for transfer to recliner and patient was assisted for comfort. Patient is making good gains with increasing mobility. Acute OT to continue to follow.    Recommendations for follow up therapy are one component of a multi-disciplinary discharge planning process, led by the attending physician.  Recommendations may be updated based on patient status, additional functional criteria and insurance authorization.    Follow Up Recommendations  Skilled nursing-short term rehab (<3 hours/day)    Assistance Recommended at Discharge Frequent or constant Supervision/Assistance  Patient can return home with the following  Two people to help with walking and/or transfers;Two people to help with bathing/dressing/bathroom;Assistance with cooking/housework;Assistance with feeding;Assist for transportation;Help with stairs or ramp for entrance   Equipment Recommendations  Other (comment) (defer)    Recommendations for Other Services      Precautions / Restrictions Precautions Precautions: Back Precaution Booklet Issued: No Precaution Comments: Reviewed precautions during functional mobility. Pt with low vision. Required Braces or  Orthoses: Spinal Brace Spinal Brace: Thoracolumbosacral orthotic Restrictions Weight Bearing Restrictions: No Other Position/Activity Restrictions: no bending, lifting, twisting       Mobility Bed Mobility Overal bed mobility: Needs Assistance Bed Mobility: Rolling, Sidelying to Sit Rolling: Min assist Sidelying to sit: Mod assist, +2 for physical assistance       General bed mobility comments: mod assist +2 to get to EOB due to back pain    Transfers Overall transfer level: Needs assistance Equipment used: None Transfers: Sit to/from Stand, Bed to chair/wheelchair/BSC Sit to Stand: Mod assist, +2 physical assistance     Step pivot transfers: Mod assist, +2 physical assistance     General transfer comment: Mod assist +2 with HHA to transfer from EOB to recliner     Balance Overall balance assessment: Needs assistance Sitting-balance support: Bilateral upper extremity supported, Feet supported Sitting balance-Leahy Scale: Poor Sitting balance - Comments: min to mod assist for sitting balance on EOB   Standing balance support: Bilateral upper extremity supported Standing balance-Leahy Scale: Poor Standing balance comment: Reliant on therapists for balance                           ADL either performed or assessed with clinical judgement   ADL Overall ADL's : Needs assistance/impaired                 Upper Body Dressing : Maximal assistance;Sitting Upper Body Dressing Details (indicate cue type and reason): max assist to donn back brace                   General ADL Comments: declined any grooming tasks    Extremity/Trunk Assessment              Vision  Perception     Praxis      Cognition Arousal/Alertness: Awake/alert Behavior During Therapy: WFL for tasks assessed/performed Overall Cognitive Status: No family/caregiver present to determine baseline cognitive functioning                                  General Comments: required encouragement to get out of bed. Stated understanding of reasoning for getting out bed, "Not my first Rodeo"        Exercises      Shoulder Instructions       General Comments      Pertinent Vitals/ Pain       Pain Assessment Pain Assessment: Faces Faces Pain Scale: Hurts whole lot Pain Location: operative site Pain Descriptors / Indicators: Discomfort, Operative site guarding, Aching, Constant Pain Intervention(s): Patient requesting pain meds-RN notified, Repositioned, Monitored during session, Limited activity within patient's tolerance  Home Living                                          Prior Functioning/Environment              Frequency  Min 2X/week        Progress Toward Goals  OT Goals(current goals can now be found in the care plan section)  Progress towards OT goals: Progressing toward goals  Acute Rehab OT Goals Patient Stated Goal: get better OT Goal Formulation: With patient Time For Goal Achievement: 07/13/22 Potential to Achieve Goals: Fair ADL Goals Pt Will Perform Grooming: sitting;with modified independence Pt Will Perform Upper Body Dressing: with min assist;sitting Pt Will Perform Lower Body Dressing: with mod assist;sit to/from stand Pt Will Transfer to Toilet: with mod assist;stand pivot transfer Additional ADL Goal #1: Pt will perform bed mobility with min guard A  Plan Discharge plan remains appropriate    Co-evaluation    PT/OT/SLP Co-Evaluation/Treatment: Yes Reason for Co-Treatment: For patient/therapist safety;To address functional/ADL transfers   OT goals addressed during session: ADL's and self-care      AM-PAC OT "6 Clicks" Daily Activity     Outcome Measure   Help from another person eating meals?: A Little Help from another person taking care of personal grooming?: A Little Help from another person toileting, which includes using toliet, bedpan, or urinal?:  Total Help from another person bathing (including washing, rinsing, drying)?: A Lot Help from another person to put on and taking off regular upper body clothing?: A Lot Help from another person to put on and taking off regular lower body clothing?: Total 6 Click Score: 12    End of Session Equipment Utilized During Treatment: Gait belt  OT Visit Diagnosis: Muscle weakness (generalized) (M62.81);Unsteadiness on feet (R26.81);History of falling (Z91.81);Low vision, both eyes (H54.2);Pain   Activity Tolerance Patient limited by pain   Patient Left in chair;with call bell/phone within reach;with chair alarm set   Nurse Communication Mobility status;Patient requests pain meds        Time: 1914-7829 OT Time Calculation (min): 29 min  Charges: OT General Charges $OT Visit: 1 Visit OT Treatments $Therapeutic Activity: 8-22 mins  Lodema Hong, OTA Acute Rehabilitation Services  Office [place] 07/02/2022, 12:55 PM

## 2022-07-02 NOTE — Progress Notes (Signed)
NEUROSURGERY PROGRESS NOTE  Doing  ok, still some back pain. Good strength in legs. Awaiting SNF placement. Continue to mobilize and work on pain control  Temp:  [97.3 F (36.3 C)-97.9 F (36.6 C)] 97.8 F (36.6 C) (09/03 0840) Pulse Rate:  [58-69] 66 (09/03 0840) Resp:  [14-20] 15 (09/03 0840) BP: (110-157)/(61-68) 157/63 (09/03 0840) SpO2:  [88 %-100 %] 100 % (09/03 0840) Weight:  [89.8 kg] 89.8 kg (09/03 0400)    Eleonore Chiquito, NP 07/02/2022 8:56 AM

## 2022-07-02 NOTE — Progress Notes (Signed)
Physical Therapy Treatment Patient Details Name: [name] [name] MRN: [id number] DOB: January 09, 1956 Today's Date: 07/02/2022   History of Present Illness Pt is a 66 y.o. female presenting 8/30 s/p posterior exploration/revision of posteriuor thoracic 11- Lumbar 3, extension of fusion T9-11 with vertebral augmentation. PMH significant for spinal surgery, chronic diastolic heart failure, CKD stage IV, renal transplant, chronic DVTs, DMII, and blindness.    PT Comments    The pt was agreeable to session despite reports of anxiety regarding pain and mobility. The pt continues to require modA of 2 to complete bed mobility, but was able to complete sit-stand transfer and small lateral steps to recliner with modA of 2 and bilateral HHA. The pt demos poor dynamic stability and activity tolerance, but VSS with exertion at this time. Further progression limited by pain and fatigue, will continue to benefit from skilled PT acutely to progress endurance and independence with transfers. Continue to recommend SNF at this time.     Recommendations for follow up therapy are one component of a multi-disciplinary discharge planning process, led by the attending physician.  Recommendations may be updated based on patient status, additional functional criteria and insurance authorization.  Follow Up Recommendations  Skilled nursing-short term rehab (<3 hours/day) Can patient physically be transported by private vehicle: No   Assistance Recommended at Discharge Frequent or constant Supervision/Assistance  Patient can return home with the following Two people to help with walking and/or transfers;Two people to help with bathing/dressing/bathroom;Assistance with cooking/housework;Assist for transportation;Help with stairs or ramp for entrance   Equipment Recommendations  None recommended by PT    Recommendations for Other Services       Precautions / Restrictions Precautions Precautions: Back Precaution Booklet  Issued: No Precaution Comments: Reviewed precautions during functional mobility. Pt with low vision. Required Braces or Orthoses: Spinal Brace Spinal Brace: Thoracolumbosacral orthotic;Applied in sitting position Restrictions Weight Bearing Restrictions: No Other Position/Activity Restrictions: no bending, lifting, twisting     Mobility  Bed Mobility Overal bed mobility: Needs Assistance Bed Mobility: Rolling, Sidelying to Sit Rolling: Min assist Sidelying to sit: Mod assist, +2 for physical assistance       General bed mobility comments: mod assist +2 to get to EOB due to back pain    Transfers Overall transfer level: Needs assistance Equipment used: 2 person hand held assist Transfers: Sit to/from Stand, Bed to chair/wheelchair/BSC Sit to Stand: Mod assist, +2 physical assistance   Step pivot transfers: Mod assist, +2 physical assistance       General transfer comment: Mod assist +2 with HHA to transfer from EOB to recliner, posterior lean and needed cues for stepping    Ambulation/Gait               General Gait Details: limited to small lateral steps to pivot to chair. poor clearance bilaterally and dependent on BUE support and modA of 2      Balance Overall balance assessment: Needs assistance Sitting-balance support: Bilateral upper extremity supported, Feet supported Sitting balance-Leahy Scale: Poor Sitting balance - Comments: min to mod assist for sitting balance on EOB   Standing balance support: Bilateral upper extremity supported Standing balance-Leahy Scale: Poor Standing balance comment: Reliant on therapists for balance                            Cognition Arousal/Alertness: Awake/alert Behavior During Therapy: WFL for tasks assessed/performed Overall Cognitive Status: No family/caregiver present to determine baseline cognitive functioning  General Comments: required encouragement to  get out of bed. Stated understanding of reasoning for getting out bed, "Not my first Rodeo"        Exercises      General Comments General comments (skin integrity, edema, etc.): BP stable despite pt reports of dizziness (125/70) and SpO2 96% on 1L      Pertinent Vitals/Pain Pain Assessment Pain Assessment: Faces Faces Pain Scale: Hurts whole lot Pain Location: operative site Pain Descriptors / Indicators: Discomfort, Operative site guarding, Aching, Constant Pain Intervention(s): Monitored during session, Repositioned, Patient requesting pain meds-RN notified     PT Goals (current goals can now be found in the care plan section) Acute Rehab PT Goals Patient Stated Goal: Be able to go home PT Goal Formulation: With patient Time For Goal Achievement: 07/13/22 Potential to Achieve Goals: Good Progress towards PT goals: Progressing toward goals    Frequency    Min 5X/week      PT Plan Current plan remains appropriate    Co-evaluation PT/OT/SLP Co-Evaluation/Treatment: Yes Reason for Co-Treatment: Necessary to address cognition/behavior during functional activity;For patient/therapist safety;To address functional/ADL transfers PT goals addressed during session: Mobility/safety with mobility;Balance;Proper use of DME OT goals addressed during session: ADL's and self-care      AM-PAC PT "6 Clicks" Mobility   Outcome Measure  Help needed turning from your back to your side while in a flat bed without using bedrails?: A Lot Help needed moving from lying on your back to sitting on the side of a flat bed without using bedrails?: A Lot Help needed moving to and from a bed to a chair (including a wheelchair)?: Total Help needed standing up from a chair using your arms (e.g., wheelchair or bedside chair)?: Total Help needed to walk in hospital room?: Total Help needed climbing 3-5 steps with a railing? : Total 6 Click Score: 8    End of Session Equipment Utilized During  Treatment: Gait belt;Oxygen Activity Tolerance: Patient tolerated treatment well;Patient limited by pain Patient left: in chair;with call bell/phone within reach;with chair alarm set Nurse Communication: Patient requests pain meds;Mobility status PT Visit Diagnosis: Unsteadiness on feet (R26.81);Pain Pain - part of body:  (back)     Time: 6629-4765 PT Time Calculation (min) (ACUTE ONLY): 29 min  Charges:  $Therapeutic Activity: 8-22 mins                     West Carbo, PT, DPT   Acute Rehabilitation Department   Sandra Cockayne 07/02/2022, 1:56 PM

## 2022-07-02 NOTE — Progress Notes (Addendum)
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  Back pain is usually chronic but worsened.  She has been following with neurosurgery as outpatient, she has extensive vertebral damages that has been under investigation.  Work-up is revealed negative abdominal pathology on CT however she does have T10-11 disc space infection.   Patient underwent  posterior arthrodesis T8-T11 with segmental instrumentation with T8-T10 pedicle screws connecting to previous instrumentation and removal of T11 pedicle screws and partial removal of rod.   Subjective   Patient complains of back pain, controlled with pain medications.   Assessment/Plan:    Acute kidney injury on CKD stage IV -Creatinine baseline 2.6-3 -Creatinine is still elevated at 4.88 -We will start normal saline at 100 ml/h -Creatinine had peaked at 4.89, nephrology was consulted; signed off after creatinine was improving -Chart review documented patient has history of renal transplant several years ago -Patient is status post renal transplant 50 years ago -She is not immunocompromised and not on any immunosuppressants -But per nephrology, patient never had renal transplant.  She is not on any immunosuppressants.  She does not have a scar in the abdomen to suggest transplant.    T10/T11 discitis/vertebral osteomyelitis -History of vertebral osteomyelitis/back surgeries -Presented with worsening back pain for past 2 to 3 weeks -CT on admission  showed discitis/osteomyelitis of T10/T11 -Neurosurgery, ID and IR were consulted -On 8/25 patient underwent disc aspiration per IR -Started on daptomycin and ceftriaxone per ID; ceftriaxone was discontinued, continue daptomycin -Neurosurgery saw patient and recommended transfer to [facility] -Patient came to [facility] and plan for surgery -Patient underwent surgery yesterday with posterior arthrodesis T8-T11 with segmental instrumentation with T8-T10 pedicle screws connecting to previous instrumentation and removal of T11 pedicle screws and partial removal of rod. -Continue pain control with IV Dilaudid  Chronic DVT -Patient was taking Eliquis which is currently on hold since 06/18/2022 -Underwent surgery yesterday -Currently on Lovenox for 4 days then will start Eliquis from 07/03/2022  Diabetes mellitus type 2 -Continue sliding scale insulin NovoLog -Continue Lantus 8 units subcu daily -CBG well controlled  HFpEF -Patient appears euvolemic at this time -Continue Lasix 40 mg p.o. daily  Anemia of chronic disease -Hemoglobin at baseline  Anxiety/depression -Continue Cymbalta daily -Continue Valium as needed  Hyperkalemia -Resolved -Potassium was mildly  elevated at 5.3 -1 dose of Lokelma 5 g p.o. given    Medications     sodium chloride   Intravenous Once   amLODipine  5 mg Oral Daily   [START ON 07/03/2022] apixaban  2.5 mg Oral BID   carvedilol  12.5 mg Oral Daily   Chlorhexidine Gluconate Cloth  6 each Topical Daily   cholecalciferol  5,000 Units Oral Daily   docusate sodium  100 mg Oral BID   DULoxetine  60 mg Oral Daily   feeding supplement (GLUCERNA SHAKE)  237 mL Oral TID BM   folic acid  2 mg Oral Daily   furosemide  40 mg Oral Daily   insulin aspart  0-9 Units Subcutaneous Q4H   insulin glargine-yfgn  8 Units Subcutaneous QHS   mometasone-formoterol  2 puff Inhalation BID   montelukast  10 mg Oral QHS   multivitamin with minerals  1 tablet Oral Daily    mupirocin cream   Topical Daily   pregabalin  25 mg Oral TID   sodium bicarbonate  650 mg Oral BID   sodium chloride flush  3 mL Intravenous Q12H   tamsulosin  0.4 mg Oral Daily     Data Reviewed:   CBG:  Recent Labs  Lab 07/01/22 2029 07/02/22 0005 07/02/22 0352 07/02/22 0828 07/02/22 1227  GLUCAP 143* 112* 95 89 132*    SpO2: 94 % O2 Flow Rate (L/min): 1 L/min    Vitals:   07/02/22 0400 07/02/22 0840 07/02/22 1009 07/02/22 1200  BP: (!) 147/61 (!) 157/63  106/64  Pulse: 69 66  (!) 57  Resp: 18 15  14   Temp: 97.7 F (36.5 C) 97.8 F (36.6 C)  98.1 F (36.7 C)  TempSrc: Oral Oral  Oral  SpO2: 98% 100% 100% 94%  Weight: 89.8 kg     Height:          Data Reviewed:  Basic Metabolic Panel: Recent Labs  Lab 06/27/22 0638 06/28/22 1000 06/28/22 1724 06/29/22 0408 06/30/22 0614 07/01/22 0500  NA 139 141 141 139 137 136  K 4.1 4.2 4.5 5.3* 4.1 4.2  CL 115* 113*  --  114* 108 109  CO2 18* 20*  --  18* 20* 22  GLUCOSE 89 105*  --  165* 115* 91  BUN 43* 42*  --  45* 54* 58*  CREATININE 3.55* 3.94*  --  3.90* 4.39* 4.88*  CALCIUM 8.5* 8.4*  --  8.3* 8.1* 8.3*    CBC: Recent Labs  Lab 06/27/22 0830 06/28/22 1000 06/28/22 1724 06/29/22 0408 06/30/22 0614 07/01/22 0500  WBC 5.5 5.2  --  8.5 9.2 6.5  HGB 8.1* 8.0* 8.5* 9.9* 8.7* 8.5*  HCT 27.0* 25.5* 25.0* 31.4* 28.0* 27.6*  MCV 90.3 88.5  --  88.7 90.0 90.2  PLT 120* 130*  --  136* 138* 128*    LFT Recent Labs  Lab 06/29/22 0408 06/30/22 0614 07/01/22 0500  AST 16 13* 13*  ALT 13 13 11   ALKPHOS 86 82 78  BILITOT 0.6 0.5 0.6  PROT 6.4* 5.8* 6.0*  ALBUMIN 2.2* 2.1* 2.1*     Antibiotics: Anti-infectives (From admission, onward)    Start     Dose/Rate Route Frequency Ordered Stop   06/28/22 1738  vancomycin (VANCOCIN) powder  Status:  Discontinued          As needed 06/28/22 1739 06/28/22 1823   06/28/22 1504  clindamycin (CLEOCIN) 900 MG/50ML IVPB       Note to Pharmacy: Rush Farmer E:  cabinet override      06/28/22 1504 06/28/22 1508   06/23/22 1600  DAPTOmycin (CUBICIN) 500 mg in sodium chloride 0.9 % IVPB        8 mg/kg  59.6 kg (Adjusted) 120 mL/hr over 30 Minutes Intravenous Every 48 hours 06/23/22 1346     06/23/22 1500  DAPTOmycin (CUBICIN) 500 mg in sodium chloride 0.9 % IVPB  Status:  Discontinued        8 mg/kg  59.6 kg (Adjusted) 120 mL/hr over 30 Minutes Intravenous Daily 06/23/22 1339 06/23/22 1346   06/21/22 2200  cefTRIAXone (ROCEPHIN) 2 g in sodium chloride 0.9 % 100 mL IVPB  Status:  Discontinued  2 g 200 mL/hr over 30 Minutes Intravenous Every 24 hours 06/21/22 0025 06/26/22 1454   06/21/22 0108  vancomycin variable dose per unstable renal function (pharmacist dosing)  Status:  Discontinued         Does not apply See admin instructions 06/21/22 0108 06/21/22 0906   06/21/22 0045  vancomycin (VANCOREADY) IVPB 1500 mg/300 mL        1,500 mg 150 mL/hr over 120 Minutes Intravenous  Once 06/21/22 0031 06/21/22 0255   06/20/22 2145  cefTRIAXone (ROCEPHIN) 2 g in sodium chloride 0.9 % 100 mL IVPB        2 g 200 mL/hr over 30 Minutes Intravenous  Once 06/20/22 2133 06/20/22 2312        DVT prophylaxis: Lovenox for 4 days then Eliquis to be started from  07/03/2022  Code Status: Full code  Family Communication:    CONSULTS neurosurgery, infectious disease   Objective    Physical Examination:  General-appears in no acute distress, legally blind Heart-S1-S2, regular, no murmur auscultated Lungs-clear to auscultation bilaterally, no wheezing or crackles auscultated Abdomen-soft, nontender, no organomegaly Extremities-no edema in the lower extremities Neuro-alert, oriented x3, no focal deficit noted Status is: Inpatient:       Oswald Hillock   [facility] If 7PM-7AM, please contact night-coverage at [email or web address], Office  [phone]   07/02/2022, 2:41 PM  LOS: 12 days

## 2022-07-03 DIAGNOSIS — N179 Acute kidney failure, unspecified: Secondary | ICD-10-CM | POA: Diagnosis not present

## 2022-07-03 DIAGNOSIS — N3000 Acute cystitis without hematuria: Secondary | ICD-10-CM | POA: Diagnosis not present

## 2022-07-03 DIAGNOSIS — M8618 Other acute osteomyelitis, other site: Secondary | ICD-10-CM | POA: Diagnosis not present

## 2022-07-03 LAB — GLUCOSE, CAPILLARY
Glucose-Capillary: 84 mg/dL (ref 70–99)
Glucose-Capillary: 72 mg/dL (ref 70–99)
Glucose-Capillary: 81 mg/dL (ref 70–99)
Glucose-Capillary: 94 mg/dL (ref 70–99)
Glucose-Capillary: 153 mg/dL — ABNORMAL HIGH (ref 70–99)
Glucose-Capillary: 134 mg/dL — ABNORMAL HIGH (ref 70–99)

## 2022-07-03 LAB — CBC
HCT: 24.2 % — ABNORMAL LOW (ref 36.0–46.0)
Hemoglobin: 7.7 g/dL — ABNORMAL LOW (ref 12.0–15.0)
MCH: 28.8 pg (ref 26.0–34.0)
MCHC: 31.8 g/dL (ref 30.0–36.0)
MCV: 90.6 fL (ref 80.0–100.0)
Platelets: 115 10*3/uL — ABNORMAL LOW (ref 150–400)
RBC: 2.67 MIL/uL — ABNORMAL LOW (ref 3.87–5.11)
RDW: 16.6 % — ABNORMAL HIGH (ref 11.5–15.5)
WBC: 5.7 10*3/uL (ref 4.0–10.5)
nRBC: 0 % (ref 0.0–0.2)

## 2022-07-03 LAB — AEROBIC/ANAEROBIC CULTURE W GRAM STAIN (SURGICAL/DEEP WOUND)
Culture: NO GROWTH
Culture: NO GROWTH
Gram Stain: NONE SEEN
Gram Stain: NONE SEEN

## 2022-07-03 LAB — BASIC METABOLIC PANEL
Anion gap: 6 (ref 5–15)
BUN: 60 mg/dL — ABNORMAL HIGH (ref 8–23)
CO2: 22 mmol/L (ref 22–32)
Calcium: 8.4 mg/dL — ABNORMAL LOW (ref 8.9–10.3)
Chloride: 110 mmol/L (ref 98–111)
Creatinine, Ser: 5.36 mg/dL — ABNORMAL HIGH (ref 0.44–1.00)
GFR, Estimated: 8 mL/min — ABNORMAL LOW (ref >60)
Glucose, Bld: 79 mg/dL (ref 70–99)
Potassium: 4.7 mmol/L (ref 3.5–5.1)
Sodium: 138 mmol/L (ref 135–145)

## 2022-07-03 MED ORDER — ENOXAPARIN SODIUM 30 MG/0.3ML IJ SOSY
30.0000 mg | PREFILLED_SYRINGE | INTRAMUSCULAR | Status: DC
Start: 2022-07-03 — End: 2022-07-04
  Administered 2022-07-03: 30 mg via SUBCUTANEOUS
  Filled 2022-07-03: qty 0.3

## 2022-07-03 MED ORDER — DARBEPOETIN ALFA 40 MCG/0.4ML IJ SOSY
40.0000 ug | PREFILLED_SYRINGE | INTRAMUSCULAR | Status: DC
  Administered 2022-07-03: 40 ug via SUBCUTANEOUS
  Filled 2022-07-03: qty 0.4

## 2022-07-03 NOTE — Progress Notes (Signed)
Physical Therapy Treatment Patient Details Name: [name] [name] MRN: [id number] DOB: July 01, 1956 Today's Date: 07/03/2022   History of Present Illness Pt is a 66 y.o. female presenting 8/30 s/p posterior exploration/revision of posteriuor thoracic 11- Lumbar 3, extension of fusion T9-11 with vertebral augmentation. PMH significant for spinal surgery, chronic diastolic heart failure, CKD stage IV, renal transplant, chronic DVTs, DMII, and blindness.    PT Comments    Patient making slow steady progress towards PT goals. Patient requires encouragement to participate due to anticipation of pain. Patient with posterior lean upon sitting EOB but able to correct with cues. Patient required modA+2 for sit to stand and step pivot transfer to recliner. Continues to be limited by pain and fatigue. Continue to recommend SNF for ongoing Physical Therapy.       Recommendations for follow up therapy are one component of a multi-disciplinary discharge planning process, led by the attending physician.  Recommendations may be updated based on patient status, additional functional criteria and insurance authorization.  Follow Up Recommendations  Skilled nursing-short term rehab (<3 hours/day) Can patient physically be transported by private vehicle: No   Assistance Recommended at Discharge Frequent or constant Supervision/Assistance  Patient can return home with the following Two people to help with walking and/or transfers;Two people to help with bathing/dressing/bathroom;Assistance with cooking/housework;Assist for transportation;Help with stairs or ramp for entrance   Equipment Recommendations  None recommended by PT    Recommendations for Other Services       Precautions / Restrictions Precautions Precautions: Back Precaution Booklet Issued: No Precaution Comments: Reviewed precautions during functional mobility. Pt with low vision. Required Braces or Orthoses: Spinal Brace Spinal Brace:  Thoracolumbosacral orthotic;Applied in sitting position Restrictions Other Position/Activity Restrictions: no bending, lifting, twisting     Mobility  Bed Mobility Overal bed mobility: Needs Assistance Bed Mobility: Rolling, Sidelying to Sit Rolling: Min guard Sidelying to sit: Mod assist, +2 for physical assistance       General bed mobility comments: mod assist +2 to get to EOB due to back pain    Transfers Overall transfer level: Needs assistance Equipment used: 2 person hand held assist Transfers: Sit to/from Stand, Bed to chair/wheelchair/BSC Sit to Stand: Mod assist, +2 physical assistance   Step pivot transfers: Mod assist, +2 physical assistance       General transfer comment: Mod assist +2 with HHAx2 to transfer from EOB to recliner, posterior lean and needed cues for stepping    Ambulation/Gait                   Stairs             Wheelchair Mobility    Modified Rankin (Stroke Patients Only)       Balance Overall balance assessment: Needs assistance Sitting-balance support: Bilateral upper extremity supported, Feet supported Sitting balance-Leahy Scale: Poor Sitting balance - Comments: min to mod assist for sitting balance on EOB Postural control: Posterior lean Standing balance support: Bilateral upper extremity supported Standing balance-Leahy Scale: Poor Standing balance comment: Reliant on therapists for balance                            Cognition Arousal/Alertness: Awake/alert Behavior During Therapy: WFL for tasks assessed/performed Overall Cognitive Status: No family/caregiver present to determine baseline cognitive functioning  General Comments: Patient needing minimal encouragement to participate, but then joking and motivated with OT/PT        Exercises      General Comments General comments (skin integrity, edema, etc.): Reports continued dizziness, need for  supplemental O2 during session to maintain saturations above 90%      Pertinent Vitals/Pain Pain Assessment Pain Assessment: Faces Faces Pain Scale: Hurts even more Pain Location: operative site Pain Descriptors / Indicators: Discomfort, Operative site guarding, Aching, Constant Pain Intervention(s): Limited activity within patient's tolerance, Monitored during session    Home Living                          Prior Function            PT Goals (current goals can now be found in the care plan section) Acute Rehab PT Goals PT Goal Formulation: With patient Time For Goal Achievement: 07/13/22 Potential to Achieve Goals: Good Progress towards PT goals: Progressing toward goals    Frequency    Min 5X/week      PT Plan Current plan remains appropriate    Co-evaluation PT/OT/SLP Co-Evaluation/Treatment: Yes Reason for Co-Treatment: Complexity of the patient's impairments (multi-system involvement);For patient/therapist safety;To address functional/ADL transfers PT goals addressed during session: Balance;Mobility/safety with mobility OT goals addressed during session: ADL's and self-care;Proper use of Adaptive equipment and DME;Strengthening/ROM      AM-PAC PT "6 Clicks" Mobility   Outcome Measure  Help needed turning from your back to your side while in a flat bed without using bedrails?: Total Help needed moving from lying on your back to sitting on the side of a flat bed without using bedrails?: Total Help needed moving to and from a bed to a chair (including a wheelchair)?: Total Help needed standing up from a chair using your arms (e.g., wheelchair or bedside chair)?: Total Help needed to walk in hospital room?: Total Help needed climbing 3-5 steps with a railing? : Total 6 Click Score: 6    End of Session Equipment Utilized During Treatment: Gait belt;Oxygen Activity Tolerance: Patient tolerated treatment well;Patient limited by pain Patient left: in  chair;with call bell/phone within reach;with chair alarm set Nurse Communication: Mobility status PT Visit Diagnosis: Unsteadiness on feet (R26.81);Pain     Time: 1093-2355 PT Time Calculation (min) (ACUTE ONLY): 37 min  Charges:  $Therapeutic Activity: 8-22 mins                     Johnita Palleschi A. Gilford Rile PT, DPT Acute Rehabilitation Services Office [phone]    Linna Hoff 07/03/2022, 2:29 PM

## 2022-07-03 NOTE — Progress Notes (Signed)
NEUROSURGERY PROGRESS NOTE  Doing well. Complains of appropriate back soreness. No leg pain No numbness, tingling or weakness Ambulating and voiding well Good strength and sensation Incision CDI  Temp:  [97.4 F (36.3 C)-98.1 F (36.7 C)] 97.9 F (36.6 C) (09/04 0755) Pulse Rate:  [57-68] 68 (09/04 0826) Resp:  [11-20] 14 (09/04 0826) BP: (106-147)/(56-75) 147/65 (09/04 0755) SpO2:  [94 %-100 %] 95 % (09/04 0755)  Plan: Please d/c drain today. Continue to mobilize. Awaiting SNF placement  Eleonore Chiquito, NP 07/03/2022 8:50 AM

## 2022-07-03 NOTE — Progress Notes (Addendum)
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  Back pain is usually chronic but worsened.  She has been following with neurosurgery as outpatient, she has extensive vertebral damages that has been under investigation.  Work-up is revealed negative abdominal pathology on CT however she does have T10-11 disc space infection.   Patient underwent  posterior arthrodesis T8-T11 with segmental instrumentation with T8-T10 pedicle screws connecting to previous instrumentation and removal of T11 pedicle screws and partial removal of rod.   Subjective   Patient seen and examined, says pain controlled with pain medications.   Assessment/Plan:    Acute kidney injury on CKD stage IV -Creatinine baseline 2.6-3 -Creatinine is still elevated at 5.88 -We will discontinue Lasix -We will reconsult nephrology today -Patient started on normal saline at 100 mL/h -Creatinine had peaked at 4.89, nephrology was consulted; signed off after creatinine was improving -Chart review documented patient has history of renal transplant several years ago -Patient is status post renal transplant 50 years ago -She is not immunocompromised and not on any immunosuppressants -But per nephrology, patient never had renal transplant.  She is not on any immunosuppressants.  She does not have a scar in the abdomen to suggest transplant.    T10/T11 discitis/vertebral osteomyelitis -History of vertebral osteomyelitis/back  surgeries -Presented with worsening back pain for past 2 to 3 weeks -CT on admission showed discitis/osteomyelitis of T10/T11 -Neurosurgery, ID and IR were consulted -On 8/25 patient underwent disc aspiration per IR -Started on daptomycin and ceftriaxone per ID; ceftriaxone was discontinued, continue daptomycin -Neurosurgery saw patient and recommended transfer to [facility] -Patient came to [facility] and plan for surgery -Patient underwent surgery yesterday with posterior arthrodesis T8-T11 with segmental instrumentation with T8-T10 pedicle screws connecting to previous instrumentation and removal of T11 pedicle screws and partial removal of rod. -Continue pain control with IV Dilaudid  Chronic DVT -Patient was taking Eliquis which is currently on hold since 06/18/2022 -Underwent surgery yesterday -Currently on Lovenox for 4 days, will start Lovenox full dose as patient has worsening creatinine, will discontinue Eliquis at this time.  Diabetes mellitus type 2 -Continue sliding scale insulin NovoLog -Continue Lantus 8 units subcu daily -CBG well controlled  HFpEF -Patient appears euvolemic at this time -Lasix on hold due to worsening renal function as above  Anemia of chronic disease -Hemoglobin is down to 7.7 -Follow CBC in a.m. and transfuse for Hb less than 7  Anxiety/depression -Continue Cymbalta daily -Continue Valium as needed  Hyperkalemia -Resolved -Potassium was mildly  elevated at 5.3 -1 dose of Lokelma 5 g p.o. given    Medications     sodium chloride   Intravenous Once   amLODipine  5 mg Oral Daily   carvedilol  12.5 mg Oral Daily   Chlorhexidine Gluconate Cloth  6 each Topical Daily   cholecalciferol  5,000 Units Oral Daily   DULoxetine  60 mg Oral Daily   enoxaparin (LOVENOX) injection  30 mg Subcutaneous Q24H   feeding supplement (GLUCERNA SHAKE)  237 mL Oral TID BM   folic  acid  2 mg Oral Daily   insulin aspart  0-9 Units Subcutaneous Q4H   insulin  glargine-yfgn  8 Units Subcutaneous QHS   mometasone-formoterol  2 puff Inhalation BID   montelukast  10 mg Oral QHS   multivitamin with minerals  1 tablet Oral Daily   mupirocin cream   Topical Daily   pregabalin  25 mg Oral TID   sodium bicarbonate  650 mg Oral BID   sodium chloride flush  3 mL Intravenous Q12H   tamsulosin  0.4 mg Oral Daily     Data Reviewed:   CBG:  Recent Labs  Lab 07/02/22 1953 07/02/22 2342 07/03/22 0411 07/03/22 0754 07/03/22 1159  GLUCAP 104* 92 84 72 81    SpO2: 95 % O2 Flow Rate (L/min): 3 L/min    Vitals:   07/03/22 0412 07/03/22 0755 07/03/22 0826 07/03/22 1205  BP: 135/64 (!) 147/65  (!) 130/49  Pulse: 61 63 68 60  Resp: 16 14 14 14   Temp: (!) 97.5 F (36.4 C) 97.9 F (36.6 C)  97.6 F (36.4 C)  TempSrc: Oral Oral  Oral  SpO2: 97% 95%    Weight:      Height:          Data Reviewed:  Basic Metabolic Panel: Recent Labs  Lab 06/28/22 1000 06/28/22 1724 06/29/22 0408 06/30/22 0614 07/01/22 0500 07/03/22 0356  NA 141 141 139 137 136 138  K 4.2 4.5 5.3* 4.1 4.2 4.7  CL 113*  --  114* 108 109 110  CO2 20*  --  18* 20* 22 22  GLUCOSE 105*  --  165* 115* 91 79  BUN 42*  --  45* 54* 58* 60*  CREATININE 3.94*  --  3.90* 4.39* 4.88* 5.36*  CALCIUM 8.4*  --  8.3* 8.1* 8.3* 8.4*    CBC: Recent Labs  Lab 06/28/22 1000 06/28/22 1724 06/29/22 0408 06/30/22 0614 07/01/22 0500 07/03/22 0356  WBC 5.2  --  8.5 9.2 6.5 5.7  HGB 8.0* 8.5* 9.9* 8.7* 8.5* 7.7*  HCT 25.5* 25.0* 31.4* 28.0* 27.6* 24.2*  MCV 88.5  --  88.7 90.0 90.2 90.6  PLT 130*  --  136* 138* 128* 115*    LFT Recent Labs  Lab 06/29/22 0408 06/30/22 0614 07/01/22 0500  AST 16 13* 13*  ALT 13 13 11   ALKPHOS 86 82 78  BILITOT 0.6 0.5 0.6  PROT 6.4* 5.8* 6.0*  ALBUMIN 2.2* 2.1* 2.1*     Antibiotics: Anti-infectives (From admission, onward)    Start     Dose/Rate Route Frequency Ordered Stop   06/28/22 1738  vancomycin (VANCOCIN) powder  Status:   Discontinued          As needed 06/28/22 1739 06/28/22 1823   06/28/22 1504  clindamycin (CLEOCIN) 900 MG/50ML IVPB       Note to Pharmacy: Rush Farmer E: cabinet override      06/28/22 1504 06/28/22 1508   06/23/22 1600  DAPTOmycin (CUBICIN) 500 mg in sodium chloride 0.9 % IVPB        8 mg/kg  59.6 kg (Adjusted) 120 mL/hr over 30 Minutes Intravenous Every 48 hours 06/23/22 1346     06/23/22 1500  DAPTOmycin (CUBICIN) 500 mg in sodium chloride 0.9 % IVPB  Status:  Discontinued        8 mg/kg  59.6 kg (Adjusted) 120 mL/hr over 30 Minutes Intravenous Daily 06/23/22 1339 06/23/22 1346   06/21/22 2200  cefTRIAXone (ROCEPHIN) 2 g  in sodium chloride 0.9 % 100 mL IVPB  Status:  Discontinued        2 g 200 mL/hr over 30 Minutes Intravenous Every 24 hours 06/21/22 0025 06/26/22 1454   06/21/22 0108  vancomycin variable dose per unstable renal function (pharmacist dosing)  Status:  Discontinued         Does not apply See admin instructions 06/21/22 0108 06/21/22 0906   06/21/22 0045  vancomycin (VANCOREADY) IVPB 1500 mg/300 mL        1,500 mg 150 mL/hr over 120 Minutes Intravenous  Once 06/21/22 0031 06/21/22 0255   06/20/22 2145  cefTRIAXone (ROCEPHIN) 2 g in sodium chloride 0.9 % 100 mL IVPB        2 g 200 mL/hr over 30 Minutes Intravenous  Once 06/20/22 2133 06/20/22 2312        DVT prophylaxis: Lovenox for 4 days then Eliquis to be started from  07/03/2022  Code Status: Full code  Family Communication:    CONSULTS neurosurgery, infectious disease   Objective    Physical Examination:  General-appears in no acute distress Heart-S1-S2, regular, no murmur auscultated Lungs-clear to auscultation bilaterally, no wheezing or crackles auscultated Abdomen-soft, nontender, no organomegaly Extremities-no edema in the lower extremities Neuro-alert, oriented x3, no focal deficit noted  Status is: Inpatient:       Oswald Hillock   [facility] If 7PM-7AM, please contact  night-coverage at [email or web address], Office  [phone]   07/03/2022, 1:37 PM  LOS: 13 days

## 2022-07-03 NOTE — Progress Notes (Signed)
Occupational Therapy Treatment Patient Details Name: [name] [name] MRN: [id number] DOB: [birth year] Today's Date: 07/03/2022   History of present illness Pt is a 66 y.o. female presenting 8/30 s/p posterior exploration/revision of posteriuor thoracic 11- Lumbar 3, extension of fusion T9-11 with vertebral augmentation. PMH significant for spinal surgery, chronic diastolic heart failure, CKD stage IV, renal transplant, chronic DVTs, DMII, and blindness.   OT comments  Patient continues to make steady progress towards goals in skilled OT session. Patient's session encompassed  co-treat with PT and OT to advance with functional transfers and mobility. Patient continues to demonstrate posterior lean sitting EOB and when completing transfers, but will correct with cues from PT and OT. Patient also remains limited by pain and fatigue therefore gait deferred. Recommendation remains appropriate as patient's husband works and cannot provide assist. OT will continue to follow acutely.    Recommendations for follow up therapy are one component of a multi-disciplinary discharge planning process, led by the attending physician.  Recommendations may be updated based on patient status, additional functional criteria and insurance authorization.    Follow Up Recommendations  Skilled nursing-short term rehab (<3 hours/day)    Assistance Recommended at Discharge Frequent or constant Supervision/Assistance  Patient can return home with the following  Two people to help with walking and/or transfers;Two people to help with bathing/dressing/bathroom;Assistance with cooking/housework;Assistance with feeding;Assist for transportation;Help with stairs or ramp for entrance   Equipment Recommendations  Other (comment) (defer to next venue)    Recommendations for Other Services      Precautions / Restrictions Precautions Precautions: Back Precaution Booklet Issued: No Precaution Comments: Reviewed precautions  during functional mobility. Pt with low vision. Required Braces or Orthoses: Spinal Brace Spinal Brace: Thoracolumbosacral orthotic;Applied in sitting position Restrictions Weight Bearing Restrictions: No Other Position/Activity Restrictions: no bending, lifting, twisting       Mobility Bed Mobility Overal bed mobility: Needs Assistance Bed Mobility: Rolling, Sidelying to Sit Rolling: Min guard Sidelying to sit: Mod assist, +2 for physical assistance       General bed mobility comments: mod assist +2 to get to EOB due to back pain    Transfers Overall transfer level: Needs assistance Equipment used: 2 person hand held assist Transfers: Sit to/from Stand, Bed to chair/wheelchair/BSC Sit to Stand: Mod assist, +2 physical assistance Stand pivot transfers: Mod assist, +2 physical assistance, +2 safety/equipment         General transfer comment: Mod assist +2 with HHA to transfer from EOB to recliner, posterior lean and needed cues for stepping     Balance Overall balance assessment: Needs assistance Sitting-balance support: Bilateral upper extremity supported, Feet supported Sitting balance-Leahy Scale: Poor Sitting balance - Comments: min to mod assist for sitting balance on EOB Postural control: Posterior lean Standing balance support: Bilateral upper extremity supported Standing balance-Leahy Scale: Poor Standing balance comment: Reliant on therapists for balance                           ADL either performed or assessed with clinical judgement   ADL Overall ADL's : Needs assistance/impaired Eating/Feeding: Set up;Sitting   Grooming: Sitting;Set up               Lower Body Dressing: Maximal assistance;Sitting/lateral leans   Toilet Transfer: Moderate assistance;+2 for physical assistance;+2 for safety/equipment;Stand-pivot Toilet Transfer Details (indicate cue type and reason): simluated with stand pivot x2 to recliner, benefitting from two person  HHA  General ADL Comments: session focus on OOB activity and increasing overall activity tolerance    Extremity/Trunk Assessment              Vision       Perception     Praxis      Cognition Arousal/Alertness: Awake/alert Behavior During Therapy: WFL for tasks assessed/performed Overall Cognitive Status: No family/caregiver present to determine baseline cognitive functioning                                 General Comments: Patient needing minimal encouragement to participate, but then joking and motivated with OT/PT        Exercises      Shoulder Instructions       General Comments Reports continued dizziness, need for supplemental O2 during session to maintain saturations above 90%    Pertinent Vitals/ Pain       Pain Assessment Pain Assessment: Faces Faces Pain Scale: Hurts even more Pain Location: operative site Pain Descriptors / Indicators: Discomfort, Operative site guarding, Aching, Constant Pain Intervention(s): Limited activity within patient's tolerance, Monitored during session, Premedicated before session, Repositioned  Home Living                                          Prior Functioning/Environment              Frequency  Min 2X/week        Progress Toward Goals  OT Goals(current goals can now be found in the care plan section)  Progress towards OT goals: Progressing toward goals  Acute Rehab OT Goals Patient Stated Goal: to get this pain under control OT Goal Formulation: With patient Time For Goal Achievement: 07/13/22 Potential to Achieve Goals: [place] Discharge plan remains appropriate    Co-evaluation    PT/OT/SLP Co-Evaluation/Treatment: Yes Reason for Co-Treatment: Complexity of the patient's impairments (multi-system involvement);For patient/therapist safety;To address functional/ADL transfers   OT goals addressed during session: ADL's and self-care;Proper use of  Adaptive equipment and DME;Strengthening/ROM      AM-PAC OT "6 Clicks" Daily Activity     Outcome Measure   Help from another person eating meals?: A Little Help from another person taking care of personal grooming?: A Little Help from another person toileting, which includes using toliet, bedpan, or urinal?: A Lot Help from another person bathing (including washing, rinsing, drying)?: A Lot Help from another person to put on and taking off regular upper body clothing?: A Little Help from another person to put on and taking off regular lower body clothing?: A Lot 6 Click Score: 15    End of Session Equipment Utilized During Treatment: Gait belt  OT Visit Diagnosis: Muscle weakness (generalized) (M62.81);Unsteadiness on feet (R26.81);History of falling (Z91.81);Low vision, both eyes (H54.2);Pain Pain - part of body:  (Back)   Activity Tolerance Patient limited by fatigue   Patient Left in chair;with call bell/phone within reach;with chair alarm set   Nurse Communication Mobility status        Time: 1601-0932 OT Time Calculation (min): 36 min  Charges: OT General Charges $OT Visit: 1 Visit OT Treatments $Self Care/Home Management : 8-22 mins  Corinne Ports E. Cierrah Dace, OTR/L Acute Rehabilitation Services [place] 07/03/2022, 11:40 AM

## 2022-07-03 NOTE — Progress Notes (Signed)
[facility] Progress Note  Name: [name] [name] MRN: [id number] DOB: Mar 30, 1956   Subjective:  Re-consulted today for worsening renal failure.  Team reports that lasix was stopped this am after her lasix 40 mg PO dose.  She was started on fluids at 100 ml/hr NS today per order history but they don't think she has actually received.  Baseline creatinine recently has been around 4 and is up to 5.36 at time of re-consult.  Similar aki event in 01/2020. She and family at bedside state that she used to get ESA injections - couldn't always make the appointments.  She's willing to get ESA here again.   Review of systems:  Visually impaired Denies shortness of breath; states wears oxygen at home intermittently on PRN basis  Denies n/v Has had diarrhea Worries about excess fluids Still has the foley     Intake/Output Summary (Last 24 hours) at 07/03/2022 1806 Last data filed at 07/03/2022 1500 Gross per 24 hour  Intake 560 ml  Output 1730 ml  Net -1170 ml    Vitals:  Vitals:   07/03/22 0755 07/03/22 0826 07/03/22 1205 07/03/22 1529  BP: (!) 147/65  (!) 130/49 132/79  Pulse: 63 68 60 60  Resp: _0 Temp: 97.9 F (36.6 C)  97.6 F (36.4 C) 97.8 F (36.6 C)  TempSrc: Oral  Oral Oral  SpO2: 95%   90%  Weight:      Height:         Physical Exam:  General adult female in bed in no acute distress HEENT normocephalic atraumatic extraocular movements intact sclera anicteric Neck supple trachea midline Lungs clear to auscultation bilaterally normal work of breathing at rest; oxygen is running but she had taken off.  Heart S1S2 no rub Abdomen soft nontender nondistended Extremities trace edema  Psych normal mood and affect Neuro visually impaired; awake and alert and eating provides basic hx  Medications reviewed   Labs:     Latest Ref Rng & Units 07/03/2022    3:56 AM 07/01/2022    5:00 AM 06/30/2022    6:14 AM  BMP  Glucose 70 - 99 mg/dL 79  91  115   BUN  8 - 23 mg/dL 60  58  54   Creatinine 0.44 - 1.00 mg/dL 5.36  4.88  4.39   Sodium 135 - 145 mmol/L 138  136  137   Potassium 3.5 - 5.1 mmol/L 4.7  4.2  4.1   Chloride 98 - 111 mmol/L 110  109  108   CO2 22 - 32 mmol/L _1 Calcium 8.9 - 10.3 mg/dL 8.4  8.3  8.1      Assessment/Plan:   AKI   Now with recurrent AKI  Earlier with AKI in setting of urinary retention, n/v w/ new vertebral discitis/ poss osteomyelitis and poss UTI. UA showed ^wbc's. CT w/o hydronephrosis. Suspected AKI due to vol depletion + urinary retention.  Continue foley catheter   CKD stage IV - baseline creat is 2.6- 3.0 from march 2023, eGFR 16-20 ml/min. More recently Cr closer to 4.  She is followed by Dr Carolin Sicks at [facility]. 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 abx per pmd Anemia CKD - defer Iv iron in setting of infection. Start ESA - aranesp 40 mcg every week on Mondays.  PRBC's per primary  team.  She does not have a renal transplant.  pt states she was adopted and that everybody has always told her that when she was a kid she had a kidney transplant. She is not on any transplant meds, there is no physical scar on the abdomen and no transplant kidney apparent on any of her abdominal CT scans. This has been discussed with the patient by multiple providers including the previous nephrologist on service this admission, me personally in 01/2020, and Dr. Florene Glen prior to her transition to Dr. Carolin Sicks upon Dr. Abel Presto retirement.      [name], MD 07/03/2022 6:39 PM

## 2022-07-04 ENCOUNTER — Ambulatory Visit: Admit: 2022-07-04 | Discharge: 2022-07-04 | Payer: MEDICARE

## 2022-07-04 ENCOUNTER — Encounter: Admit: 2022-07-04 | Discharge: 2022-07-04 | Payer: MEDICARE

## 2022-07-04 DIAGNOSIS — I82503 Chronic embolism and thrombosis of unspecified deep veins of lower extremity, bilateral: Secondary | ICD-10-CM | POA: Diagnosis not present

## 2022-07-04 DIAGNOSIS — M8618 Other acute osteomyelitis, other site: Secondary | ICD-10-CM | POA: Diagnosis not present

## 2022-07-04 DIAGNOSIS — N185 Chronic kidney disease, stage 5: Secondary | ICD-10-CM | POA: Diagnosis not present

## 2022-07-04 DIAGNOSIS — N179 Acute kidney failure, unspecified: Secondary | ICD-10-CM | POA: Diagnosis not present

## 2022-07-04 DIAGNOSIS — I10 Essential (primary) hypertension: Secondary | ICD-10-CM

## 2022-07-04 DIAGNOSIS — N1832 Stage 3b chronic kidney disease (HCC): Secondary | ICD-10-CM

## 2022-07-04 DIAGNOSIS — D509 Iron deficiency anemia, unspecified: Secondary | ICD-10-CM

## 2022-07-04 DIAGNOSIS — E559 Vitamin D deficiency, unspecified: Secondary | ICD-10-CM

## 2022-07-04 LAB — URINALYSIS DIPSTICK
GLUCOSE,UA: NEGATIVE
NITRITE: NEGATIVE
PROTEIN,UA: NEGATIVE
URINE ASCORBIC ACID, UA: NEGATIVE
URINE BILE: NEGATIVE
URINE BLOOD: NEGATIVE
URINE KETONE: NEGATIVE
URINE PH: 7 (ref 5.0–8.0)
URINE SPEC GRAVITY: 1 (ref 1.005–1.030)

## 2022-07-04 LAB — CBC AND DIFF
ABSOLUTE BASO COUNT: 0 K/UL (ref 0–0.20)
ABSOLUTE EOS COUNT: 0.2 K/UL (ref 0–0.45)
ABSOLUTE LYMPH COUNT: 2.1 K/UL (ref 1.0–4.8)
ABSOLUTE MONO COUNT: 0.5 K/UL (ref 0–0.80)
ABSOLUTE NEUTROPHIL: 4.6 K/UL (ref 1.8–7.0)
BASOPHILS %: 1 % (ref 0–2)
EOSINOPHILS %: 3 % (ref 0–5)
HEMATOCRIT: 35 % — ABNORMAL LOW (ref 36–45)
LYMPHOCYTES %: 28 % (ref 24–44)
MCHC: 33 g/dL (ref 32.0–36.0)
MONOCYTES %: 7 % (ref 4–12)
MPV: 8.3 FL (ref 7–11)
NEUTROPHILS %: 61 % (ref 41–77)
PLATELET COUNT: 265 K/UL (ref 150–400)
RBC COUNT: 4.2 M/UL — ABNORMAL HIGH (ref 4.0–5.0)
RDW: 15 % — ABNORMAL HIGH (ref 11–15)

## 2022-07-04 LAB — BASIC METABOLIC PANEL
ANION GAP: 9 K/UL (ref 3–12)
CO2: 22 MMOL/L (ref 21–30)
POTASSIUM: 4.4 MMOL/L (ref 3.5–5.1)
SODIUM: 138 MMOL/L (ref 137–147)

## 2022-07-04 LAB — PHOSPHORUS: PHOSPHORUS: 3.2 mg/dL — ABNORMAL LOW (ref >60)

## 2022-07-04 LAB — URINALYSIS, MICROSCOPIC

## 2022-07-04 LAB — 25-OH VITAMIN D (D2 + D3): VITAMIN D (25-OH) TOTAL: 30 ng/mL (ref 30–80)

## 2022-07-04 LAB — PROTEIN/CR RATIO,UR RAN
UR CREATININE, RAN: 41 mg/dL
UR TOTAL PROTEIN,RAN: 8 mg/dL

## 2022-07-04 LAB — IRON + BINDING CAPACITY + %SAT+ FERRITIN
% SATURATION: 18 % — ABNORMAL LOW (ref 28–42)
IRON: 74 ug/dL — ABNORMAL LOW (ref 50–160)

## 2022-07-04 LAB — CBC
HCT: 26.3 % — ABNORMAL LOW (ref 36.0–46.0)
Hemoglobin: 8.2 g/dL — ABNORMAL LOW (ref 12.0–15.0)
MCH: 27.9 pg (ref 26.0–34.0)
MCHC: 31.2 g/dL (ref 30.0–36.0)
MCV: 89.5 fL (ref 80.0–100.0)
Platelets: 131 10*3/uL — ABNORMAL LOW (ref 150–400)
RBC: 2.94 MIL/uL — ABNORMAL LOW (ref 3.87–5.11)
RDW: 16.4 % — ABNORMAL HIGH (ref 11.5–15.5)
WBC: 5 10*3/uL (ref 4.0–10.5)
nRBC: 0 % (ref 0.0–0.2)

## 2022-07-04 LAB — COMPREHENSIVE METABOLIC PANEL
ALT: 13 U/L (ref 0–44)
AST: 15 U/L (ref 15–41)
Albumin: 2.2 g/dL — ABNORMAL LOW (ref 3.5–5.0)
Alkaline Phosphatase: 94 U/L (ref 38–126)
Anion gap: 6 (ref 5–15)
BUN: 54 mg/dL — ABNORMAL HIGH (ref 8–23)
CO2: 22 mmol/L (ref 22–32)
Calcium: 8.6 mg/dL — ABNORMAL LOW (ref 8.9–10.3)
Chloride: 111 mmol/L (ref 98–111)
Creatinine, Ser: 5.23 mg/dL — ABNORMAL HIGH (ref 0.44–1.00)
GFR, Estimated: 9 mL/min — ABNORMAL LOW (ref >60)
Glucose, Bld: 93 mg/dL (ref 70–99)
Potassium: 4.8 mmol/L (ref 3.5–5.1)
Sodium: 139 mmol/L (ref 135–145)
Total Bilirubin: 0.3 mg/dL (ref 0.3–1.2)
Total Protein: 6.5 g/dL (ref 6.5–8.1)

## 2022-07-04 LAB — AEROBIC/ANAEROBIC CULTURE W GRAM STAIN (SURGICAL/DEEP WOUND): Culture: NO GROWTH

## 2022-07-04 LAB — GLUCOSE, CAPILLARY
Glucose-Capillary: 85 mg/dL (ref 70–99)
Glucose-Capillary: 89 mg/dL (ref 70–99)
Glucose-Capillary: 102 mg/dL — ABNORMAL HIGH (ref 70–99)
Glucose-Capillary: 111 mg/dL — ABNORMAL HIGH (ref 70–99)
Glucose-Capillary: 122 mg/dL — ABNORMAL HIGH (ref 70–99)

## 2022-07-04 MED ORDER — ENOXAPARIN SODIUM 100 MG/ML IJ SOSY
90.0000 mg | PREFILLED_SYRINGE | INTRAMUSCULAR | Status: DC
  Administered 2022-07-04 – 2022-07-09 (×5): 90 mg via SUBCUTANEOUS
  Filled 2022-07-04 (×6): qty 0.9

## 2022-07-04 MED ORDER — SODIUM CHLORIDE 0.9 % IV SOLN
500.0000 mg | INTRAVENOUS | Status: DC
  Administered 2022-07-05 – 2022-07-09 (×3): 500 mg via INTRAVENOUS
  Filled 2022-07-04 (×5): qty 10

## 2022-07-04 MED ORDER — METHOCARBAMOL 500 MG PO TABS
500.0000 mg | ORAL_TABLET | Freq: Three times a day (TID) | ORAL | Status: DC | PRN
  Administered 2022-07-04 – 2022-07-09 (×2): 500 mg via ORAL
  Filled 2022-07-04 (×2): qty 1

## 2022-07-04 NOTE — Progress Notes (Signed)
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  Back pain is usually chronic but worsened.  She has been following with neurosurgery as outpatient, she has extensive vertebral damages that has been under investigation.  Work-up is revealed negative abdominal pathology on CT however she does have T10-11 disc space infection.   Patient underwent  posterior arthrodesis T8-T11 with segmental instrumentation with T8-T10 pedicle screws connecting to previous instrumentation and removal of T11 pedicle screws and partial removal of rod.  Patient had worsening of creatinine to 5.88, nephrology reconsulted.   Subjective   Patient developed diarrhea overnight, laxatives have is down to 5.88 discontinued.  Diarrhea is improving   Assessment/Plan:    Acute kidney injury on CKD stage IV -Creatinine baseline 2.6-3 -Creatinine started to rise and peaked at 5.88 yesterday  -Lasix was held; nephrology reconsulted -Creatinine is down to 5.23 -Creatinine had peaked at 4.89, nephrology was consulted; signed off after creatinine was improving -Chart review documented patient has history of renal transplant several years ago -Patient said that she is status post renal transplant 50 years ago -She is not immunocompromised and not on any immunosuppressants -But per nephrology, patient never had renal transplant.  She is not on any immunosuppressants.  She does not have a scar in the abdomen to suggest transplant.     T10/T11 discitis/vertebral osteomyelitis -History of vertebral osteomyelitis/back surgeries -Presented with worsening back pain for past 2 to 3 weeks -CT on admission showed discitis/osteomyelitis of T10/T11 -Neurosurgery, ID and IR were consulted -On 8/25 patient underwent disc aspiration per IR -Started on daptomycin and ceftriaxone per ID; ceftriaxone was discontinued, continue daptomycin -Neurosurgery saw patient and recommended transfer to [facility] -Patient came to [facility] and plan for surgery -Patient underwent surgery  with posterior arthrodesis T8-T11 with segmental instrumentation with T8-T10 pedicle screws connecting to previous instrumentation and removal of T11 pedicle screws and partial removal of rod. -Continue pain control with IV Dilaudid  Chronic DVT -Patient was taking Eliquis which is currently on hold since 06/18/2022 -Underwent surgery yesterday -She was started on Lovenox for DVT prophylaxis after surgery -Neurosurgery recommended to restart Eliquis from 07/03/2022 however patient renal function is worse.  Will discontinue Eliquis at this time and start Lovenox full dose per pharmacy  Diabetes mellitus type 2 -Continue sliding scale insulin NovoLog -Continue Lantus 8 units subcu daily -CBG well controlled  HFpEF -Patient appears euvolemic at this time -Lasix on hold due to worsening renal function as above  Anemia of chronic disease -Hemoglobin  up to 8.2 today -Follow CBC in a.m. and transfuse for Hb less than 7  Anxiety/depression -Continue Cymbalta daily -Continue Valium as needed  Hyperkalemia -Resolved     Medications     sodium chloride   Intravenous Once   amLODipine  5 mg Oral Daily   carvedilol  12.5 mg Oral Daily   Chlorhexidine Gluconate Cloth  6 each Topical Daily   cholecalciferol  5,000 Units Oral Daily   darbepoetin (ARANESP) injection - NON-DIALYSIS  40 mcg Subcutaneous Q Mon-1800   DULoxetine  60 mg Oral Daily   enoxaparin  (LOVENOX) injection  30 mg Subcutaneous Q24H   feeding supplement (GLUCERNA SHAKE)  237 mL Oral TID BM   folic acid  2 mg Oral Daily   insulin aspart  0-9 Units Subcutaneous Q4H   insulin glargine-yfgn  8 Units Subcutaneous QHS   mometasone-formoterol  2 puff Inhalation BID   montelukast  10 mg Oral QHS   multivitamin with minerals  1 tablet Oral Daily   mupirocin cream   Topical Daily   pregabalin  25 mg Oral TID   sodium bicarbonate  650 mg Oral BID   sodium chloride flush  3 mL Intravenous Q12H   tamsulosin  0.4 mg Oral Daily     Data Reviewed:   CBG:  Recent Labs  Lab 07/03/22 1940 07/03/22 2318 07/04/22 0359 07/04/22 0811 07/04/22 1131  GLUCAP 153* 134* 85 89 102*    SpO2: 97 % O2 Flow Rate (L/min): 2 L/min    Vitals:   07/04/22 0424 07/04/22 0802 07/04/22 0810 07/04/22 1130  BP:   (!) 154/76 (!) 157/66  Pulse:   68 69  Resp:   16 14  Temp:   97.8 F (36.6 C) 98.3 F (36.8 C)  TempSrc:   Oral Oral  SpO2:  96% 100% 97%  Weight: 89.8 kg     Height:          Data Reviewed:  Basic Metabolic Panel: Recent Labs  Lab 06/29/22 0408 06/30/22 0614 07/01/22 0500 07/03/22 0356 07/04/22 0406  NA 139 137 136 138 139  K 5.3* 4.1 4.2 4.7 4.8  CL 114* 108 109 110 111  CO2 18* 20* 22 22 22   GLUCOSE 165* 115* 91 79 93  BUN 45* 54* 58* 60* 54*  CREATININE 3.90* 4.39* 4.88* 5.36* 5.23*  CALCIUM 8.3* 8.1* 8.3* 8.4* 8.6*    CBC: Recent Labs  Lab 06/29/22 0408 06/30/22 0614 07/01/22 0500 07/03/22 0356 07/04/22 0406  WBC 8.5 9.2 6.5 5.7 5.0  HGB 9.9* 8.7* 8.5* 7.7* 8.2*  HCT 31.4* 28.0* 27.6* 24.2* 26.3*  MCV 88.7 90.0 90.2 90.6 89.5  PLT 136* 138* 128* 115* 131*    LFT Recent Labs  Lab 06/29/22 0408 06/30/22 0614 07/01/22 0500 07/04/22 0406  AST 16 13* 13* 15  ALT 13 13 11 13   ALKPHOS 86 82 78 94  BILITOT 0.6 0.5 0.6 0.3  PROT 6.4* 5.8* 6.0* 6.5  ALBUMIN 2.2* 2.1* 2.1* 2.2*     Antibiotics: Anti-infectives (From admission, onward)     Start     Dose/Rate Route Frequency Ordered Stop   06/28/22 1738  vancomycin (VANCOCIN) powder  Status:  Discontinued          As needed 06/28/22 1739 06/28/22 1823   06/28/22 1504  clindamycin (CLEOCIN) 900 MG/50ML IVPB       Note to Pharmacy: Rush Farmer E: cabinet override      06/28/22 1504 06/28/22 1508   06/23/22 1600  DAPTOmycin (CUBICIN) 500 mg in sodium chloride 0.9 % IVPB        8 mg/kg  59.6 kg (Adjusted) 120 mL/hr over 30 Minutes Intravenous Every 48 hours 06/23/22 1346     06/23/22 1500  DAPTOmycin (CUBICIN) 500 mg in sodium chloride 0.9 % IVPB  Status:  Discontinued        8 mg/kg  59.6 kg (Adjusted) 120 mL/hr over 30 Minutes Intravenous Daily 06/23/22 1339 06/23/22 1346   06/21/22 2200  cefTRIAXone (ROCEPHIN) 2 g in  sodium chloride 0.9 % 100 mL IVPB  Status:  Discontinued        2 g 200 mL/hr over 30 Minutes Intravenous Every 24 hours 06/21/22 0025 06/26/22 1454   06/21/22 0108  vancomycin variable dose per unstable renal function (pharmacist dosing)  Status:  Discontinued         Does not apply See admin instructions 06/21/22 0108 06/21/22 0906   06/21/22 0045  vancomycin (VANCOREADY) IVPB 1500 mg/300 mL        1,500 mg 150 mL/hr over 120 Minutes Intravenous  Once 06/21/22 0031 06/21/22 0255   06/20/22 2145  cefTRIAXone (ROCEPHIN) 2 g in sodium chloride 0.9 % 100 mL IVPB        2 g 200 mL/hr over 30 Minutes Intravenous  Once 06/20/22 2133 06/20/22 2312        DVT prophylaxis: Lovenox for 4 days; start full dose Lovenox  Code Status: Full code  Family Communication:    CONSULTS neurosurgery, infectious disease   Objective    Physical Examination:  General-appears in no acute distress Heart-S1-S2, regular, no murmur auscultated HEENT-visually impaired Lungs-clear to auscultation bilaterally, no wheezing or crackles auscultated Abdomen-soft, nontender, no organomegaly Extremities-no edema in the lower extremities Neuro-alert, oriented x3, no focal  deficit noted  Status is: Inpatient:       Oswald Hillock   [facility] If 7PM-7AM, please contact night-coverage at [email or web address], Office  [phone]   07/04/2022, 12:10 PM  LOS: 14 days

## 2022-07-04 NOTE — Progress Notes (Signed)
[place] for  full dose Lovenox Indication: chronic DVT  Allergies  Allergen Reactions   Bee Pollen Anaphylaxis   Broccoli [Brassica Oleracea] Anaphylaxis and Hives   Fish-Derived Products Anaphylaxis and Hives   Mushroom Extract Complex Anaphylaxis   Penicillins Anaphylaxis    **Tolerated cefepime March 2021 Did it involve swelling of the face/tongue/throat, SOB, or low BP? Yes Did it involve sudden or severe rash/hives, skin peeling, or any reaction on the inside of your mouth or nose? No Did you need to seek medical attention at a hospital or doctor's office? Yes When did it last happen?  A few months ago If all above answers are "NO", may proceed with cephalosporin use.   Rosemary Oil Anaphylaxis   Shellfish Allergy Anaphylaxis and Hives   Tomato Anaphylaxis and Hives    Raw tomatoes only, can tolerate if cooked   Acetaminophen Other (See Comments)    GI upset   Aloe Vera Hives   Acyclovir And Related Other (See Comments)    Unknown reaction   Naproxen Hives    Patient Measurements: Height: 4' 11"  (149.9 cm) Weight: 89.8 kg (197 lb 15.6 oz) IBW/kg (Calculated) : 43.2 Heparin Dosing Weight: 65 kg  Vital Signs: Temp: 98.3 F (36.8 C) (09/05 1130) Temp Source: Oral (09/05 1130) BP: 157/66 (09/05 1130) Pulse Rate: 69 (09/05 1130)  Labs: Recent Labs    07/03/22 0356 07/04/22 0406  HGB 7.7* 8.2*  HCT 24.2* 26.3*  PLT 115* 131*  CREATININE 5.36* 5.23*     Estimated Creatinine Clearance (by C-G formula based on SCr of 5.23 mg/dL (H)) Female: 10.3 mL/min (A) Female: 12.7 mL/min (A)   Medications:  PTA Eliquis 2.5 mg BID (last dose: 8/19)(might be taking once daily based on fill records)  Assessment: 22 yoF admitted on 8/22 with possible Discitis/OM, s/p aspiration on 8/25. 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  Eliquis is currently on hold since 06/18/2022.   T10/T11 discitis/vertebral  osteomyelitis.  Underwent surgery on 06/28/22. She was started on Lovenox for DVT prophylaxis after surgery. -Neurosurgery recommended to restart Eliquis from 07/03/2022 however Dr. Scharlene Corn noted patient renal function is worse.  Dr. Darrick Meigs noted will discontinue Eliquis at this time and start Lovenox full dose per pharmacy for chronic DVT.   AKI on CKD stage IV: SCr  5.36>5.23  Hgb 8s- 9s>>7.7 > 8.2 today,.  Pltc 115>131k  Plan:  Lovenox 82m/kg/ SQ q24h = 90 mg SQ q24h Monitor CBC q72 hr Monitor for s/sx of bleeding F/u for ability to resume Eliquis   RNicole Cella RPh Clinical Pharmacist 07/04/2022 12:34 PM Please utilize Amion for appropriate phone number to reach the unit pharmacist ([email or web address]

## 2022-07-04 NOTE — Progress Notes (Signed)
Physical Therapy Treatment Patient Details Name: [name] [name] MRN: [id number] DOB: Jun 15, 1956 Today's Date: 07/04/2022   History of Present Illness Pt is a 66 y.o. female presenting 8/30 s/p posterior exploration/revision of posteriuor thoracic 11- Lumbar 3, extension of fusion T9-11 with vertebral augmentation. PMH significant for spinal surgery, chronic diastolic heart failure, CKD stage IV, renal transplant, chronic DVTs, DMII, and blindness.    PT Comments    Received pt semi-reclined in bed with husband present at bedside. Pt moaning and grimacing in pain reporting 12/10 R low back pain (premedicated) - offered repositioning EOB or OOB, however pt refused stating "I'm not doing anything today". Offered to assist with repositioning in bed and pt reluctantly agreed. Removed pillows and provided mod A to reposition LEs in midline and placed pillow under ankles - pt immediately reported relief. MD, NP, and PA arrived and NT entered to check vitals. Pt expressed she did not want to go to SNF and refused option of AIR - encouraged pt to get OOB to prove she is able to go home, but pt refused. Acute PT to cont to follow.      Recommendations for follow up therapy are one component of a multi-disciplinary discharge planning process, led by the attending physician.  Recommendations may be updated based on patient status, additional functional criteria and insurance authorization.  Follow Up Recommendations  Skilled nursing-short term rehab (<3 hours/day) Can patient physically be transported by private vehicle: No   Assistance Recommended at Discharge Frequent or constant Supervision/Assistance  Patient can return home with the following Two people to help with walking and/or transfers;Two people to help with bathing/dressing/bathroom;Assistance with cooking/housework;Assist for transportation;Help with stairs or ramp for entrance   Equipment Recommendations  Other (comment) (TBD)     Recommendations for Other Services       Precautions / Restrictions Precautions Precautions: Back Precaution Booklet Issued: No Required Braces or Orthoses: Spinal Brace Spinal Brace: Thoracolumbosacral orthotic;Applied in sitting position Restrictions Weight Bearing Restrictions: No Other Position/Activity Restrictions: no bending, lifting, twisting     Mobility  Bed Mobility               General bed mobility comments: refused to transfer to EOB Patient Response: Anxious  Transfers                   General transfer comment: refused to attempt transferring into recliner to reposition    Ambulation/Gait               General Gait Details: refused to attempt   Stairs             Wheelchair Mobility    Modified Rankin (Stroke Patients Only)       Balance       Sitting balance - Comments: refused to sit EOB                                    Cognition Arousal/Alertness: Awake/alert Behavior During Therapy: Anxious Overall Cognitive Status: Difficult to assess                                 General Comments: pt declining getting OOB due to pain stating "I'm not doing anything today". Husband present at bedside        Exercises      General Comments General comments (  skin integrity, edema, etc.): Discussed recommendation for SNF however pt refused. Pt also refused option of AIR staing "they let me fall out of the bed up there". Pt insiting on returning home      Pertinent Vitals/Pain Pain Assessment Pain Assessment: 0-10 Pain Score: 10-Worst pain ever Body Language: tense, distressed pacing, fidgeting Pain Location: R low back Pain Descriptors / Indicators: Discomfort, Operative site guarding, Aching, Constant, Dull, Grimacing, Guarding Pain Intervention(s): Limited activity within patient's tolerance, Monitored during session, Repositioned, Premedicated before session    Home Living                           Prior Function            PT Goals (current goals can now be found in the care plan section) Acute Rehab PT Goals Patient Stated Goal: Be able to go home PT Goal Formulation: With patient Time For Goal Achievement: 07/13/22 Potential to Achieve Goals: Fair Progress towards PT goals: Progressing toward goals    Frequency    Min 5X/week      PT Plan Current plan remains appropriate    Co-evaluation              AM-PAC PT "6 Clicks" Mobility   Outcome Measure                   End of Session   Activity Tolerance: Patient limited by pain Patient left: in bed;with call bell/phone within reach;with family/visitor present;Other (comment) (MD present at bedside) Nurse Communication: Mobility status PT Visit Diagnosis: Unsteadiness on feet (R26.81);Pain;Muscle weakness (generalized) (M62.81);Other abnormalities of gait and mobility (R26.89) Pain - Right/Left: Right Pain - part of body:  (low back)     Time: 9826-4158 PT Time Calculation (min) (ACUTE ONLY): 14 min  Charges:  $Therapeutic Activity: 8-22 mins                     Becky Sax PT, DPT  Blenda Nicely 07/04/2022, 11:33 AM

## 2022-07-04 NOTE — Progress Notes (Signed)
[facility]ious [facility]  Date of Admission:  06/20/2022           Reason for visit: Follow up on discitis/osteomyelitis  Current antibiotics: Daptomycin  ASSESSMENT:    66 y.o. 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  She has required lumbar fusion hardware and has been continued on antibiotics for prolonged periods of time, however, this was subsequently stopped at an unknown time point.  She presents this admission with worsening back pain and MRI concerning for new infection.  She is status post disc space aspiration with cultures that grew Staph epidermidis.  She went back to the OR on 06/28/2022 for posterior arthrodesis T8-T11 with segmental instrumentation with T8-T10 pedicle screws connecting to previous instrumentation and removal of T11 pedicle screws and partial removal of rod.  Surgical cultures have been negative. Acute on chronic kidney disease: Following with nephrology.  Creatinine today is slightly improved down to 5.23.   RECOMMENDATIONS:    Continue daptomycin.  Her Staph epi has been sent and found to be susceptible See OP AT note below Will place IR order for tunneled central line given CKD Will sign off, please call as needed  Diagnosis: Discitis/osteomyelitis  Culture Result: Staph epidermidis  Allergies  Allergen Reactions  . Bee Pollen Anaphylaxis  . Broccoli [Brassica Oleracea] Anaphylaxis and Hives  . Fish-Derived Products Anaphylaxis and Hives  . Mushroom Extract Complex Anaphylaxis  . Penicillins Anaphylaxis    **Tolerated cefepime March 2021 Did it involve swelling of the face/tongue/throat, SOB, or low BP? Yes Did it involve sudden or severe rash/hives, skin peeling, or any reaction on the inside of your mouth or nose? No Did you need to seek medical attention at a hospital or doctor's office?  Yes When did it last happen?  A few months ago If all above answers are "NO", may proceed with cephalosporin use.  [name] Kitchen Rosemary Oil Anaphylaxis  . Shellfish Allergy Anaphylaxis and Hives  . Tomato Anaphylaxis and Hives    Raw tomatoes only, can tolerate if cooked  . Acetaminophen Other (See Comments)    GI upset  . Aloe Vera Hives  . Acyclovir And Related Other (See Comments)    Unknown reaction  . Naproxen Hives    OPAT Orders Discharge antibiotics to be given via PICC line Discharge antibiotics: Per pharmacy protocol  Daptomycin 500 mg every 48 hours  Duration: 6 weeks  End Date: 08/09/2022  [facility] Care Per Protocol:  Home health RN for IV administration and teaching; PICC line care and labs.    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 sodium chloride   Intravenous Once  . amLODipine  5 mg Oral  Daily  . carvedilol  12.5 mg Oral Daily  . Chlorhexidine Gluconate Cloth  6 each Topical Daily  . cholecalciferol  5,000 Units Oral Daily  . darbepoetin (ARANESP) injection - NON-DIALYSIS  40 mcg Subcutaneous Q Mon-1800  . DULoxetine  60 mg Oral Daily  . enoxaparin (LOVENOX) injection  90 mg Subcutaneous Q24H  . feeding supplement (GLUCERNA SHAKE)  237 mL Oral TID BM  . folic acid  2 mg Oral Daily  . insulin aspart  0-9 Units  Subcutaneous Q4H  . insulin glargine-yfgn  8 Units Subcutaneous QHS  . mometasone-formoterol  2 puff Inhalation BID  . montelukast  10 mg Oral QHS  . multivitamin with minerals  1 tablet Oral Daily  . mupirocin cream   Topical Daily  . pregabalin  25 mg Oral TID  . sodium bicarbonate  650 mg Oral BID  . sodium chloride flush  3 mL Intravenous Q12H  . tamsulosin  0.4 mg Oral Daily   Continuous Infusions: . sodium chloride    . DAPTOmycin (CUBICIN) 500 mg in sodium chloride 0.9 % IVPB 500 mg (07/03/22 1504)   PRN Meds:.acetaminophen **OR** acetaminophen, albuterol, diazepam, hydrALAZINE, HYDROmorphone (DILAUDID) injection, lubiprostone, menthol-cetylpyridinium **OR** phenol, naLOXone (NARCAN)  injection, ondansetron **OR** ondansetron (ZOFRAN) IV, mouth rinse, oxyCODONE, oxyCODONE, simethicone, sodium chloride flush  SUBJECTIVE:   24 hour events:  No acute events although per nursing did have some confusion and agitation overnight She is working with physical therapy this morning She complains of back pain She states that she is not interested in going to skilled nursing for rehab We discussed antibiotic plan including prolonged course of IV therapy followed by oral suppression potentially  No other new complaints  Review of Systems  All other systems reviewed and are negative.     OBJECTIVE:   Blood pressure (!) 157/66, pulse 69, temperature 98.3 F (36.8 C), temperature source Oral, resp. rate 14, height 4' 11"  (1.499 m), weight 89.8 kg, SpO2 97 %. Body mass index is 39.99 kg/m.  Physical Exam Constitutional:      Comments: Chronically ill-appearing woman, lying in bed appears uncomfortable.  Physical therapist and husband are at the bedside.  HENT:     Head: Normocephalic and atraumatic.  Musculoskeletal:     Cervical back: Normal range of motion and neck supple.     Right lower leg: No edema.     Left lower leg: No edema.  Skin:    General: Skin is warm and dry.   Neurological:     General: No focal deficit present.     Mental Status: She is oriented to person, place, and time.  Psychiatric:        Mood and Affect: Mood normal.        Behavior: Behavior normal.      Lab Results: Lab Results  Component Value Date   WBC 5.0 07/04/2022   HGB 8.2 (L) 07/04/2022   HCT 26.3 (L) 07/04/2022   MCV 89.5 07/04/2022   PLT 131 (L) 07/04/2022    Lab Results  Component Value Date   NA 139 07/04/2022   K 4.8 07/04/2022   CO2 22 07/04/2022   GLUCOSE 93 07/04/2022   BUN 54 (H) 07/04/2022   CREATININE 5.23 (H) 07/04/2022   CALCIUM 8.6 (L) 07/04/2022   GFRNONAA 9 (L) 07/04/2022   GFRAA 31 (L) 06/16/2020    Lab Results  Component Value Date   ALT 13 07/04/2022   AST 15 07/04/2022   ALKPHOS 94  07/04/2022   BILITOT 0.3 07/04/2022       Component Value Date/Time   CRP 2.0 (H) 06/20/2022 1813       Component Value Date/Time   ESRSEDRATE 126 (H) 06/20/2022 1813     I have reviewed the micro and lab results in Epic.  Imaging: No results found.   Imaging independently reviewed in Epic.    Raynelle Highland for Infectious Disease [facility] [phone] pager 07/04/2022, 12:59 PM  I have personally spent 50 minutes involved in face-to-face and non-face-to-face activities for this patient on the day of the visit. Professional time spent includes the following activities: Preparing to see the patient (review of tests), Obtaining and/or reviewing separately obtained history (admission/discharge record), Performing a medically appropriate examination and/or evaluation , Ordering medications/tests/procedures, referring and communicating with other health care professionals, Documenting clinical information in the EMR, Independently interpreting results (not separately reported), Communicating results to the patient/family/caregiver, Counseling and educating the patient/family/caregiver and Care coordination (not  separately reported).

## 2022-07-04 NOTE — Plan of Care (Signed)
  Problem: Coping: Goal: Ability to adjust to condition or change in health will improve Outcome: Progressing   Problem: Skin Integrity: Goal: Risk for impaired skin integrity will decrease Outcome: Progressing   Problem: Tissue Perfusion: Goal: Adequacy of tissue perfusion will improve Outcome: Progressing   Problem: Clinical Measurements: Goal: Respiratory complications will improve Outcome: Progressing   Problem: Activity: Goal: Risk for activity intolerance will decrease Outcome: Progressing

## 2022-07-04 NOTE — Progress Notes (Signed)
[facility] Progress Note  Name: [name] [name] MRN: [id number] DOB: 07/02/1956   Subjective:  She had 2.4 liters UOP over 9/4 charted.  Baseline creatinine recently has been around 4 and was up to 5.36 at time of re-consult.  Similar aki event in 01/2020.  Per nursing she was confused and agitated overnight.  She thought her dog had been kidnapped.  Husband is at bedside and the dog is fine.  Her husband states she is prescribed lasix 20 mg BID at home but he only gives her lasix 20 mg daily (her real home dose is 20 mg daily).    Review of systems:    Visually impaired Denies shortness of breath; states that her oxygen was just taken off Nausea this am Has had diarrhea but this is getting better Still has the foley     Intake/Output Summary (Last 24 hours) at 07/04/2022 1122 Last data filed at 07/04/2022 0900 Gross per 24 hour  Intake 1280 ml  Output 2400 ml  Net -1120 ml    Vitals:  Vitals:   07/04/22 0328 07/04/22 0424 07/04/22 0802 07/04/22 0810  BP: (!) 151/60   (!) 154/76  Pulse: 70   68  Resp: 18   16  Temp: 98 F (36.7 C)   97.8 F (36.6 C)  TempSrc: Oral   Oral  SpO2: 96%  96% 100%  Weight:  89.8 kg    Height:         Physical Exam:   General adult female in bed in no acute distress HEENT normocephalic atraumatic extraocular movements intact sclera anicteric Neck supple trachea midline Lungs clear to auscultation bilaterally normal work of breathing at rest; on room air Heart S1S2 no rub Abdomen soft nontender nondistended Extremities no edema  Psych normal mood and affect Neuro visually impaired; awake and alert provides basic hx Gu foley in place with pale yellow urine  Medications reviewed   Labs:     Latest Ref Rng & Units 07/04/2022    4:06 AM 07/03/2022    3:56 AM 07/01/2022    5:00 AM  BMP  Glucose 70 - 99 mg/dL 93  79  91   BUN 8 - 23 mg/dL 54  60  58   Creatinine 0.44 - 1.00 mg/dL 5.23  5.36  4.88   Sodium 135 - 145 mmol/L 139   138  136   Potassium 3.5 - 5.1 mmol/L 4.8  4.7  4.2   Chloride 98 - 111 mmol/L 111  110  109   CO2 22 - 32 mmol/L _0 Calcium 8.9 - 10.3 mg/dL 8.6  8.4  8.3      Assessment/Plan:   AKI   Now with recurrent AKI  Earlier with AKI in setting of urinary retention, n/v w/ new vertebral discitis/ poss osteomyelitis and poss UTI. UA showed ^wbc's. CT w/o hydronephrosis. Suspected AKI due to vol depletion + urinary retention.  Continue foley catheter  Creatinine has plateaued thankfully and urine output improved.  Hopeful for continued improvement with supportive care Defer lasix today. Note that lasix 40 mg daily is listed her home dose but she normally just takes 20 mg daily for reference.   CKD stage IV - baseline creat is 2.6- 3.0 from march 2023, eGFR 16-20 ml/min. More recently Cr closer to 4.  She is followed by Dr [facility]in [facility] at [facility]. Metabolic acidosis - on sodium bicarbonate  HTN - acceptable Acute UTI - s/p abx  per primary team  Discitis/ osteomyelitis - at T10-11, T4-5. abx per pmd Anemia CKD - defer Iv iron in setting of infection. Started ESA - aranesp 40 mcg every week on Mondays.  PRBC's per primary team.  Please note that she does not have a renal transplant.  pt states she was adopted and that everybody has always told her that when she was a kid she had a kidney transplant. She is not on any transplant meds, there is no physical scar on the abdomen and no transplant kidney apparent on any of her abdominal CT scans. This has been discussed with the patient by multiple providers including at least the previous nephrologist on service this admission, me personally in 01/2020, me personally on re-consult, and Dr. Florene Glen more remotely prior to her transition to Dr. Carolin Sicks upon Dr. Abel Presto retirement.    Disposition - continue inpatient monitoring  [name], MD 07/04/2022 11:34 AM

## 2022-07-04 NOTE — Progress Notes (Signed)
This nurse entered room to obtain vital signs and pt said she was angry because this nurse told her she would be right back and did not come back for 30 minutes.  I reminded her that when she called to request assistance, I did stop what I was doing and repositioned her.  She did not recall but was reminded what side she was on then and what side she is on now.  She then said we need more help because she was told someone would stay with her and I have not stayed with her.  I reminded her that I would be checking on her q 1-2 hours, as I have done.  She then asked what I did with her dog.  She said her dog was here and now it is gone and she accused me of taking her dog.  I reminded her that she is in the hospital and that she was asleep and was awakened for vitals and she is slightly confused.  At first she got upset because I said she got confused but after several times reminding her she is in the hospital and her dog is at home, she finally understood.  She also got upset because she said I had not emptied her JP drain and the doctor said it was important to empty it.  I reminded her that it was taken out on day shift before I got here and we had talked about it because she said she did not even feel when it was taken out.  I just discussed with her when you wake up slightly confused, once she is up longer, she will be less confused.

## 2022-07-04 NOTE — Telephone Encounter (Signed)
Standard written order and PT Mobility/ Seating evaluation faxed back today

## 2022-07-04 NOTE — Telephone Encounter
I called and spoke to the patient blood pressure which remains uncontrolled.  I discussed with her to continue with previous schedule of clonidine 0.2 mg in the morning and 0.1 mg in the evening.  Follow-up BMP, aldosterone and renin in 2 weeks.  Julious Payer, MD

## 2022-07-04 NOTE — Progress Notes (Signed)
   Patient Status: [facility] - In-pt  Assessment and Plan: Patient in need of venous access.   Tunneled central catheter placement  ______________________________________________________________________   History of Present Illness: [name] [name] is a 66 y.o. adult   Discitis/osteomyelitis Long hx of infection Most recent surgery for back 06/28/22 for posterior arthrodesis T8-T11 with segmental instrumentation with T8-T10 pedicle screws connecting to previous instrumentation and removal of T11 pedicle screws and partial removal of rod.   Acute on chronic renal disease High Cr:  5.2  Plan per ID: Continue daptomycin.  Her Staph epi has been sent and found to be susceptible See OP AT note below Will place IR order for tunneled central line given CKD  Scheduled for same 9/6 in IR (possible 9/5 if can)  Allergies and medications reviewed.   Review of Systems: A 12 point ROS discussed and pertinent positives are indicated in the HPI above.  All other systems are negative.  Vital Signs: BP (!) 157/66 (BP Location: Left Leg)   Pulse 69   Temp 98.3 F (36.8 C) (Oral)   Resp 14   Ht 4' 11"  (1.499 m)   Wt 197 lb 15.6 oz (89.8 kg)   SpO2 97%   BMI 39.99 kg/m   Physical Exam Constitutional:      Comments: Blind  Musculoskeletal:        General: Normal range of motion.  Skin:    General: Skin is warm.  Neurological:     Mental Status: She is alert and oriented to person, place, and time.  Psychiatric:        Behavior: Behavior normal.      Imaging reviewed.   Labs:  COAGS: Recent Labs    10/17/21 1403 10/17/21 1804 01/07/22 0542 03/25/22 1911 06/23/22 0520 06/23/22 1708  INR 1.4*  --  1.3* 1.1 1.2  --   APTT 31 43*  --  34  --  29    BMP: Recent Labs    06/30/22 0614 07/01/22 0500 07/03/22 0356 07/04/22 0406  NA 137 136 138 139  K 4.1 4.2 4.7 4.8  CL 108 109 110 111  CO2 20* 22 22 22   GLUCOSE 115* 91 79 93  BUN 54* 58* 60* 54*  CALCIUM 8.1*  8.3* 8.4* 8.6*  CREATININE 4.39* 4.88* 5.36* 5.23*  GFRNONAA 11* 9* 8* 9*   Scheduled for tunneled central line in IR today or tomorrow Pt is aware of procedure benefits and risks including but not limited to' infection; bleeding; vessel damage Agreeable to proceed Consent signed in chart    Electronically Signed: Lavonia Drafts, PA-C 07/04/2022, 2:26 PM   I spent a total of 15 minutes in face to face in clinical consultation, greater than 50% of which was counseling/coordinating care for venous access.

## 2022-07-04 NOTE — Progress Notes (Signed)
PHARMACY CONSULT NOTE FOR:  OUTPATIENT  PARENTERAL ANTIBIOTIC THERAPY (OPAT)  Indication: Discitis/osteomyelitis Regimen: Daptomycin 500 mg every 48 hours  End date: 08/09/22  IV antibiotic discharge orders are pended. To discharging provider:  please sign these orders via discharge navigator,  Select New Orders & click on the button choice - Manage This Unsigned Work.     Thank you for allowing pharmacy to be a part of this patient's care.  Jimmy Footman, PharmD, BCPS, BCIDP Infectious Diseases Clinical Pharmacist Phone: [phone] 07/04/2022, 1:48 PM

## 2022-07-05 ENCOUNTER — Inpatient Hospital Stay (HOSPITAL_COMMUNITY): Payer: PRIVATE HEALTH INSURANCE

## 2022-07-05 ENCOUNTER — Inpatient Hospital Stay: Payer: PRIVATE HEALTH INSURANCE

## 2022-07-05 ENCOUNTER — Inpatient Hospital Stay: Payer: PRIVATE HEALTH INSURANCE | Attending: Hematology & Oncology

## 2022-07-05 ENCOUNTER — Encounter: Admit: 2022-07-05 | Discharge: 2022-07-05 | Payer: MEDICARE

## 2022-07-05 DIAGNOSIS — N183 Chronic kidney disease, stage 3 unspecified: Secondary | ICD-10-CM | POA: Insufficient documentation

## 2022-07-05 DIAGNOSIS — Z86718 Personal history of other venous thrombosis and embolism: Secondary | ICD-10-CM | POA: Insufficient documentation

## 2022-07-05 DIAGNOSIS — Z7901 Long term (current) use of anticoagulants: Secondary | ICD-10-CM | POA: Insufficient documentation

## 2022-07-05 DIAGNOSIS — I82503 Chronic embolism and thrombosis of unspecified deep veins of lower extremity, bilateral: Secondary | ICD-10-CM | POA: Diagnosis not present

## 2022-07-05 DIAGNOSIS — N179 Acute kidney failure, unspecified: Secondary | ICD-10-CM | POA: Diagnosis not present

## 2022-07-05 DIAGNOSIS — I10 Essential (primary) hypertension: Secondary | ICD-10-CM | POA: Diagnosis not present

## 2022-07-05 DIAGNOSIS — M869 Osteomyelitis, unspecified: Secondary | ICD-10-CM | POA: Diagnosis not present

## 2022-07-05 DIAGNOSIS — D631 Anemia in chronic kidney disease: Secondary | ICD-10-CM | POA: Insufficient documentation

## 2022-07-05 HISTORY — PX: IR US GUIDE VASC ACCESS LEFT: IMG2389

## 2022-07-05 HISTORY — PX: IR FLUORO GUIDE CV LINE LEFT: IMG2282

## 2022-07-05 LAB — CBC
HCT: 26.3 % — ABNORMAL LOW (ref 36.0–46.0)
Hemoglobin: 8.4 g/dL — ABNORMAL LOW (ref 12.0–15.0)
MCH: 28.4 pg (ref 26.0–34.0)
MCHC: 31.9 g/dL (ref 30.0–36.0)
MCV: 88.9 fL (ref 80.0–100.0)
Platelets: 155 10*3/uL (ref 150–400)
RBC: 2.96 MIL/uL — ABNORMAL LOW (ref 3.87–5.11)
RDW: 16.5 % — ABNORMAL HIGH (ref 11.5–15.5)
WBC: 5.6 10*3/uL (ref 4.0–10.5)
nRBC: 0 % (ref 0.0–0.2)

## 2022-07-05 LAB — BASIC METABOLIC PANEL
Anion gap: 9 (ref 5–15)
BUN: 46 mg/dL — ABNORMAL HIGH (ref 8–23)
CO2: 22 mmol/L (ref 22–32)
Calcium: 8.8 mg/dL — ABNORMAL LOW (ref 8.9–10.3)
Chloride: 109 mmol/L (ref 98–111)
Creatinine, Ser: 4.46 mg/dL — ABNORMAL HIGH (ref 0.44–1.00)
GFR, Estimated: 10 mL/min — ABNORMAL LOW (ref >60)
Glucose, Bld: 94 mg/dL (ref 70–99)
Potassium: 4.5 mmol/L (ref 3.5–5.1)
Sodium: 140 mmol/L (ref 135–145)

## 2022-07-05 LAB — GLUCOSE, CAPILLARY
Glucose-Capillary: 126 mg/dL — ABNORMAL HIGH (ref 70–99)
Glucose-Capillary: 137 mg/dL — ABNORMAL HIGH (ref 70–99)
Glucose-Capillary: 78 mg/dL (ref 70–99)
Glucose-Capillary: 81 mg/dL (ref 70–99)
Glucose-Capillary: 88 mg/dL (ref 70–99)
Glucose-Capillary: 92 mg/dL (ref 70–99)
Glucose-Capillary: 95 mg/dL (ref 70–99)

## 2022-07-05 MED ORDER — LIDOCAINE HCL (PF) 1 % IJ SOLN
INTRAMUSCULAR | Status: DC | PRN
  Administered 2022-07-05: 5 mL

## 2022-07-05 MED ORDER — PHENYLEPHRINE HCL-NACL 20-0.9 MG/250ML-% IV SOLN
INTRAVENOUS | Status: AC
  Filled 2022-07-05: qty 250

## 2022-07-05 MED ORDER — LIDOCAINE HCL 1 % IJ SOLN
INTRAMUSCULAR | Status: AC
  Filled 2022-07-05: qty 20

## 2022-07-05 NOTE — Progress Notes (Signed)
[facility] Progress Note  Name: [name] [name] MRN: [id number] DOB: Dec 04, 1955   Subjective:  She had 3.3 liters UOP over 9/5 charted.  Baseline creatinine recently has been around 4 and was up to 5.36 at time of re-consult.  Similar aki event in 01/2020. She states she has been up working with staff on moving around and has gotten out of bed to the chair  Review of systems:    Visually impaired Denies shortness of breath Nausea this am Diarrhea resolved Still has the foley     Intake/Output Summary (Last 24 hours) at 07/05/2022 1345 Last data filed at 07/05/2022 1213 Gross per 24 hour  Intake 240 ml  Output 4300 ml  Net -4060 ml    Vitals:  Vitals:   07/05/22 0303 07/05/22 0500 07/05/22 0751 07/05/22 1210  BP: (!) 143/60  (!) 152/98 (!) 158/75  Pulse: 74  75 75  Resp: 20  18 18   Temp: 98.2 F (36.8 C)  98.8 F (37.1 C) 98.8 F (37.1 C)  TempSrc: Oral  Oral Oral  SpO2: 95%  98% 97%  Weight:  89 kg    Height:         Physical Exam:    General adult female in bed in no acute distress HEENT normocephalic atraumatic extraocular movements intact sclera anicteric Neck supple trachea midline Lungs clear to auscultation bilaterally normal work of breathing at rest; on room air Heart S1S2 no rub Abdomen soft nontender nondistended Extremities no edema  Psych normal mood and affect Neuro visually impaired; awake and alert provides basic hx Gu foley in place with pale yellow urine  Medications reviewed   Labs:     Latest Ref Rng & Units 07/05/2022    4:52 AM 07/04/2022    4:06 AM 07/03/2022    3:56 AM  BMP  Glucose 70 - 99 mg/dL 94  93  79   BUN 8 - 23 mg/dL 46  54  60   Creatinine 0.44 - 1.00 mg/dL 4.46  5.23  5.36   Sodium 135 - 145 mmol/L 140  139  138   Potassium 3.5 - 5.1 mmol/L 4.5  4.8  4.7   Chloride 98 - 111 mmol/L 109  111  110   CO2 22 - 32 mmol/L 22  22  22    Calcium 8.9 - 10.3 mg/dL 8.8  8.6  8.4      Assessment/Plan:   AKI   Now  with recurrent AKI  Earlier with AKI in setting of urinary retention, n/v w/ new vertebral discitis/ poss osteomyelitis and poss UTI. UA showed ^wbc's. CT w/o hydronephrosis. Suspected AKI due to vol depletion + urinary retention.  Continue foley catheter  Creatinine has plateaued thankfully and urine output improved.  Hopeful for continued improvement with supportive care Defer lasix today. Note that lasix 40 mg daily is listed her home dose but she normally just takes 20 mg daily for reference.    CKD stage IV - baseline creat is 2.6- 3.0 from march 2023, eGFR 16-20 ml/min. More recently Cr closer to 4.  She is followed by Dr Carolin Sicks at [facility]. 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 abx per pmd Anemia CKD - defer Iv iron in setting of infection. Started ESA - aranesp 40 mcg every week on Mondays.  PRBC's per primary team.  Please note that she does not have  a renal transplant.  pt states she was adopted and that everybody has always told her that when she was a kid she had a kidney transplant. She is not on any transplant meds, there is no physical scar on the abdomen and no transplant kidney apparent on any of her abdominal CT scans. This has been discussed with the patient by multiple providers including at least the previous nephrologist on service this admission, me personally in 01/2020, me personally on re-consult, and Dr. Florene Glen more remotely prior to her transition to Dr. Carolin Sicks upon Dr. Abel Presto retirement.    Disposition - continue inpatient monitoring  [name], MD 07/05/2022 1:53 PM

## 2022-07-05 NOTE — Plan of Care (Signed)
  Problem: Coping: Goal: Ability to adjust to condition or change in health will improve Outcome: Progressing   Problem: Fluid Volume: Goal: Ability to maintain a balanced intake and output will improve Outcome: Progressing   Problem: Health Behavior/Discharge Planning: Goal: Ability to identify and utilize available resources and services will improve Outcome: Progressing   Problem: Activity: Goal: Risk for activity intolerance will decrease Outcome: Progressing   Problem: Nutrition: Goal: Adequate nutrition will be maintained Outcome: Progressing

## 2022-07-05 NOTE — Progress Notes (Signed)
Occupational Therapy Treatment Patient Details Name: [name] [name] MRN: [id number] DOB: [birth year] Today's Date: 07/05/2022   History of present illness Pt is a 66 y.o. female presenting 8/30 s/p posterior exploration/revision of posteriuor thoracic 11- Lumbar 3, extension of fusion T9-11 with vertebral augmentation. PMH significant for spinal surgery, chronic diastolic heart failure, CKD stage IV, renal transplant, chronic DVTs, DMII, and blindness.   OT comments  Pt with slow progression towards goals, up in chair upon arrival. Pt declining ADLs this session, however able to stand at sink x1 time from chair level with mod A. Pt becoming agitated with therapist's requests, tearful during session. Pt with decreased cognition, stating " so what do you want me to do?"  After given instruction. Pt calling out for husband who was not in the room and asking to be put back in the chair when she was already seated in chair. Pt presenting with impairments listed below, will follow acutely. Recommend SNF at d/c.   Recommendations for follow up therapy are one component of a multi-disciplinary discharge planning process, led by the attending physician.  Recommendations may be updated based on patient status, additional functional criteria and insurance authorization.    Follow Up Recommendations  Skilled nursing-short term rehab (<3 hours/day)    Assistance Recommended at Discharge Frequent or constant Supervision/Assistance  Patient can return home with the following  Two people to help with walking and/or transfers;Two people to help with bathing/dressing/bathroom;Assistance with cooking/housework;Assistance with feeding;Assist for transportation;Help with stairs or ramp for entrance   Equipment Recommendations  Other (comment) (defer)    Recommendations for Other Services      Precautions / Restrictions Precautions Precautions: Back Precaution Booklet Issued: No Precaution Comments: Reviewed  precautions during functional mobility. Pt with low vision. Required Braces or Orthoses: Spinal Brace Spinal Brace: Thoracolumbosacral orthotic;Applied in sitting position (brace donned upon arrival) Restrictions Weight Bearing Restrictions: No Other Position/Activity Restrictions: no bending, lifting, twisting       Mobility Bed Mobility               General bed mobility comments: OOB in chair upon arrival and departure    Transfers Overall transfer level: Needs assistance Equipment used: None Transfers: Sit to/from Stand Sit to Stand: Mod assist           General transfer comment: sit to stand transfer x1 pulling up on sink, pt refusing more than one transfer at this time due to pain     Balance Overall balance assessment: Needs assistance Sitting-balance support: Bilateral upper extremity supported, Feet supported Sitting balance-Leahy Scale: Fair   Postural control: Posterior lean Standing balance support: Bilateral upper extremity supported Standing balance-Leahy Scale: Poor Standing balance comment: reliant on RW                           ADL either performed or assessed with clinical judgement   ADL Overall ADL's : Needs assistance/impaired                             Toileting- Clothing Manipulation and Hygiene: Total assistance Toileting - Clothing Manipulation Details (indicate cue type and reason): catheter       General ADL Comments: pt declining ADLs, stating she "already did that"    Extremity/Trunk Assessment Upper Extremity Assessment Upper Extremity Assessment: Generalized weakness   Lower Extremity Assessment Lower Extremity Assessment: Defer to PT evaluation  Vision   Additional Comments: pt with eyes closed 80% of the time   Perception Perception Perception: Not tested   Praxis Praxis Praxis: Not tested    Cognition Arousal/Alertness: Awake/alert Behavior During Therapy: Anxious Overall  Cognitive Status: No family/caregiver present to determine baseline cognitive functioning                                 General Comments: pt keeping eyes closed, calling for her husband who was not in the room, continues to think she is going to "fall" when she is already seated in chair; pt asking therapist to repeat instructions multiple times        Exercises      Shoulder Instructions       General Comments VSS on RA    Pertinent Vitals/ Pain       Pain Assessment Pain Assessment: Faces Pain Score: 8  Faces Pain Scale: Hurts whole lot Pain Location: low back and to hips Pain Descriptors / Indicators: Discomfort, Operative site guarding, Aching, Constant, Dull, Grimacing, Guarding Pain Intervention(s): Limited activity within patient's tolerance, Monitored during session, Repositioned  Home Living                                          Prior Functioning/Environment              Frequency  Min 2X/week        Progress Toward Goals  OT Goals(current goals can now be found in the care plan section)  Progress towards OT goals: Progressing toward goals  Acute Rehab OT Goals Patient Stated Goal: decrease pain OT Goal Formulation: With patient Time For Goal Achievement: 07/13/22 Potential to Achieve Goals: Fair ADL Goals Pt Will Perform Grooming: sitting;with modified independence Pt Will Perform Upper Body Dressing: with min assist;sitting Pt Will Perform Lower Body Dressing: with mod assist;sit to/from stand Pt Will Transfer to Toilet: with mod assist;stand pivot transfer Additional ADL Goal #1: Pt will perform bed mobility with min guard A  Plan Discharge plan remains appropriate    Co-evaluation                 AM-PAC OT "6 Clicks" Daily Activity     Outcome Measure   Help from another person eating meals?: A Little Help from another person taking care of personal grooming?: A Little Help from another person  toileting, which includes using toliet, bedpan, or urinal?: Total Help from another person bathing (including washing, rinsing, drying)?: A Lot Help from another person to put on and taking off regular upper body clothing?: A Little Help from another person to put on and taking off regular lower body clothing?: A Lot 6 Click Score: 14    End of Session Equipment Utilized During Treatment: Back brace  OT Visit Diagnosis: Muscle weakness (generalized) (M62.81);Unsteadiness on feet (R26.81);History of falling (Z91.81);Low vision, both eyes (H54.2);Pain Pain - part of body:  (back and bil hips)   Activity Tolerance Patient limited by fatigue   Patient Left in chair;with call bell/phone within reach;with chair alarm set;with nursing/sitter in room   Nurse Communication Mobility status        Time: 3846-6599 OT Time Calculation (min): 22 min  Charges: OT General Charges $OT Visit: 1 Visit OT Treatments $Therapeutic Activity: 8-22 mins  Lynnda Child, OTD, OTR/L  Acute Rehab [phone] - [place] 07/05/2022, 3:24 PM

## 2022-07-05 NOTE — Progress Notes (Signed)
Physical Therapy Treatment Patient Details Name: [name] [name] MRN: [id number] DOB: Jul 05, 1956 Today's Date: 07/05/2022   History of Present Illness Pt is a 66 y.o. female presenting 8/30 s/p posterior exploration/revision of posteriuor thoracic 11- Lumbar 3, extension of fusion T9-11 with vertebral augmentation. PMH significant for spinal surgery, chronic diastolic heart failure, CKD stage IV, renal transplant, chronic DVTs, DMII, and blindness.    PT Comments    Pt lethargic and keeping eyes closed t/o session however did follow commands. Pt with sign on her door stating "pt is blind", however pt reports seeing objects and operates a regular cell phone without modifications. Pt c/o 10/10 back pain despite being asleep and inability to keep eyes open. With max encouragement pt was able to get up and transfer to recliner today. Pt strongly encouraged to mobilize if she wants to go home. At this time pt is modAx2 for OOB mobility. Spoke with pt about going SNF however pt stopped talking questionable behavioral/ignoring discussion about SNF. Acute PT to cont to follow.    Recommendations for follow up therapy are one component of a multi-disciplinary discharge planning process, led by the attending physician.  Recommendations may be updated based on patient status, additional functional criteria and insurance authorization.  Follow Up Recommendations  Skilled nursing-short term rehab (<3 hours/day) Can patient physically be transported by private vehicle: No   Assistance Recommended at Discharge Frequent or constant Supervision/Assistance  Patient can return home with the following Two people to help with walking and/or transfers;Two people to help with bathing/dressing/bathroom;Assistance with cooking/housework;Assist for transportation;Help with stairs or ramp for entrance   Equipment Recommendations  Other (comment) (TBD)    Recommendations for Other Services       Precautions /  Restrictions Precautions Precautions: Back Precaution Booklet Issued: No Precaution Comments: Reviewed precautions during functional mobility. Pt with low vision. Required Braces or Orthoses: Spinal Brace Spinal Brace: Thoracolumbosacral orthotic;Applied in sitting position Restrictions Weight Bearing Restrictions: No Other Position/Activity Restrictions: no bending, lifting, twisting     Mobility  Bed Mobility Overal bed mobility: Needs Assistance Bed Mobility: Rolling, Sidelying to Sit Rolling: Max assist Sidelying to sit: +2 for physical assistance, Max assist       General bed mobility comments: max encouragement given, use of bed pad to aid with log roll to the L, maxA for trunk elevation and LE management off bed despite pt being able to move her LEs in the bed    Transfers Overall transfer level: Needs assistance Equipment used: Rolling walker (2 wheels) Transfers: Sit to/from Stand, Bed to chair/wheelchair/BSC Sit to Stand: Mod assist, +2 physical assistance   Step pivot transfers: Mod assist, +2 physical assistance       General transfer comment: pt kept eyes closed t/o session requiring constant verbal cues to open them, increased time and required seated rest break from initial stand to step pvt transfer to chair    Ambulation/Gait               General Gait Details: pt took 4 steps to the chair   Stairs             Wheelchair Mobility    Modified Rankin (Stroke Patients Only)       Balance Overall balance assessment: Needs assistance Sitting-balance support: Bilateral upper extremity supported, Feet supported Sitting balance-Leahy Scale: Fair Sitting balance - Comments: occasional retropulsion until pt took deep breaths to calm down Postural control: Posterior lean Standing balance support: Bilateral upper extremity supported Standing  balance-Leahy Scale: Poor Standing balance comment: reliant on RW                             Cognition Arousal/Alertness: Awake/alert Behavior During Therapy: Anxious Overall Cognitive Status: No family/caregiver present to determine baseline cognitive functioning                                 General Comments: pt received sleeping with mouth wide open and head in extension. Pt initially refused stating, "not before my procedure" however pt had already gone down and has been back for an hour from her line placement. Pt kept eyes closed t/o session and would go in/out of sleep vs 10/10 pain        Exercises      General Comments General comments (skin integrity, edema, etc.): Pt with honeycomb dressing on back      Pertinent Vitals/Pain Pain Assessment Pain Assessment: 0-10 Pain Score: 10-Worst pain ever Pain Location: low back across abdomen Pain Descriptors / Indicators: Discomfort, Operative site guarding, Aching, Constant, Dull, Grimacing, Guarding    Home Living                          Prior Function            PT Goals (current goals can now be found in the care plan section) Acute Rehab PT Goals PT Goal Formulation: With patient Time For Goal Achievement: 07/13/22 Potential to Achieve Goals: Fair Progress towards PT goals: Progressing toward goals    Frequency    Min 5X/week      PT Plan Current plan remains appropriate    Co-evaluation              AM-PAC PT "6 Clicks" Mobility   Outcome Measure  Help needed turning from your back to your side while in a flat bed without using bedrails?: A Lot Help needed moving from lying on your back to sitting on the side of a flat bed without using bedrails?: A Lot Help needed moving to and from a bed to a chair (including a wheelchair)?: A Lot Help needed standing up from a chair using your arms (e.g., wheelchair or bedside chair)?: A Lot Help needed to walk in hospital room?: A Lot Help needed climbing 3-5 steps with a railing? : A Lot 6 Click Score: 12    End  of Session Equipment Utilized During Treatment: Gait belt Activity Tolerance: Patient limited by pain Patient left: with call bell/phone within reach;in chair;with chair alarm set (RN present) Nurse Communication: Mobility status PT Visit Diagnosis: Unsteadiness on feet (R26.81);Pain;Muscle weakness (generalized) (M62.81);Other abnormalities of gait and mobility (R26.89) Pain - Right/Left: Right Pain - part of body:  (low back)     Time: 6948-5462 PT Time Calculation (min) (ACUTE ONLY): 27 min  Charges:  $Therapeutic Activity: 23-37 mins                     Kittie Plater, PT, DPT Acute Rehabilitation Services Secure chat preferred Office #: [phone]    Berline Lopes 07/05/2022, 1:06 PM

## 2022-07-05 NOTE — TOC Progression Note (Signed)
Transition of Care [facility] - Progression Note    Patient Details  Name: [name] [name] MRN: [id number] Date of Birth: 1955-11-27  Transition of Care [facility]) CM/SW Contact  Pollie Friar, RN Phone Number: 07/05/2022, 3:57 PM  Clinical Narrative:    Pt continues to states she [name] d/c home when medically ready. She states her spouse has done IV abx before and they hire caregivers until she is doing better.  TOC following.   Expected Discharge Plan: [place] Barriers to Discharge: Continued Medical Work up  Expected Discharge Plan and Services Expected Discharge Plan: [place]   Discharge Planning Services: CM Consult Post Acute Care Choice: [place] arrangements for the past 2 months: Apartment                           HH Arranged: RN, PT, OT, Nurse's Aide, Social Work [employer] Agency: [place] Date [place]: 06/29/22 Time [place]: 1630 Representative spoke with at [place]: [place] ([place]) Interventions    Readmission Risk Interventions    06/23/2022    2:13 PM 01/23/2020    2:59 PM 11/18/2019   10:54 AM  Readmission Risk Prevention Plan  Transportation Screening Complete Complete Complete  PCP or Specialist Appt within 3-5 Days Complete    HRI or [place] Complete    Social Work Consult for [employer] Planning/Counseling [place] Not Applicable    Medication Review [employer] photographer) Complete Complete Referral to Pharmacy  PCP or Specialist appointment within 3-5 days of discharge  Complete Complete  HRI or [place]  Complete Complete  SW Recovery Care/Counseling Consult  Complete Complete  Palliative Care Screening  Complete Not [place]  Complete Not Applicable

## 2022-07-05 NOTE — Progress Notes (Signed)
[facility] PROGRESS NOTE    Progress Note  [name] [name]  RAX:[id number] DOB: Mar 27, 1956 DOA: 06/20/2022 PCP: Ann Held, DO     Brief Narrative:   [name] [name] is an 66 y.o. 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 Patient had worsening creatinine nephrology was reconsulted.   Assessment/Plan:   Acute kidney injury on chronic kidney disease stage IV: With a baseline creatinine of 2.6-3, increased to 5.8. Lasix was held nephrology was consulted. 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 CT scan on admission showed discitis and osteomyelitis of T10-11. Neurosurgery, ID and interventional radiology were consulted. Patient underwent IR aspiration on 06/23/2022 She was started on daptomycin and Rocephin, he was consulted continuing daptomycin. IR consulted for central tunneled catheter given chronic kidney disease.  ID signed off on 9 04/03/2022 Neurosurgery saw the patient recommended transfer to [facility] and is planning for surgery. Patient underwent posterior arthrodesis of T8-3 11 and removal of T11 pedicle screws and removal of rods. Continue IV narcotics for pain control. For tunneled catheter given chronic kidney disease.  Chronic DVT: At home was on Eliquis it was held on  06/18/2022 Underwent surgical intervention and drain has been removed. Neurosurgery recommended to restart Eliquis on 07/03/2022 however renal function worsen. Eliquis has been discontinued now on Lovenox full dose per pharmacy.  Diabetes mellitus type 2: Continue sliding scale insulin plus long-acting insulin.  Chronic diastolic heart failure: Appears euvolemic continue to hold Lasix as renal functioning was worsening.  Anxiety/depression: Continue Cymbalta and Valium.  Hyperkalemia: Now resolved.   DVT prophylaxis: Lovenox Family Communication:none Status is: Inpatient Remains inpatient appropriate because: Awaiting placement to CIR.    Code Status:     Code Status Orders  (From admission, onward)           Start     Ordered   06/21/22 0021  Full code  Continuous        06/21/22 0025           Code Status History     Date Active Date Inactive Code Status Order ID Comments User Context   01/07/2022 0511 01/09/2022 2045 Full Code [id number]  Rhetta Mura DO Inpatient   10/17/2021 0420 10/22/2021 1928 Full Code [id number]  Chotiner, Yevonne Aline, MD ED   02/18/2020 2241 02/26/2020 2334 Full Code [id number]  Dwyane Dee, MD Inpatient   01/29/2020 1704 02/18/2020 2044 Full Code [id number]  Cathlyn Parsons, PA-C Inpatient   01/29/2020 1704 01/29/2020 1704 Full Code [id number]  Cathlyn Parsons, PA-C Inpatient   01/22/2020 2141 01/29/2020 1650 Full Code [id number]  Vallarie Mare, MD Inpatient   01/21/2020 0012 01/22/2020 2141 Full Code [id number]  Rise Patience, MD Inpatient   11/15/2019 2200 11/18/2019 2226 Full Code [id number]  Jani Gravel, MD ED   04/15/2019 2122 04/21/2019 2320 Full Code [id number]  Elwyn Reach, MD Inpatient  04/06/2017 2004 04/11/2017 2109 Full Code [id number]  Edwin Dada, MD Inpatient   09/29/2016 1831 10/02/2016 1643 Full Code [id number]  [name], MD Inpatient   03/19/2013 1507 03/21/2013 1812 Full Code [id number]  [name], MD  Inpatient   02/10/2013 2124 02/20/2013 1859 Full Code [id number]  [name], MD Inpatient   02/03/2013 2137 02/10/2013 2124 Full Code [id number]  Bynum Bellows, MD Inpatient   01/11/2012 0612 01/13/2012 1645 Full Code [id number]  Herb Grays, RN Inpatient         IV Access:   Peripheral IV   Procedures and diagnostic studies:   No results found.   Medical Consultants:   None.   Subjective:    [name] [name] complaining of pain.  Objective:    Vitals:   07/05/22 0003 07/05/22 0303 07/05/22 0500 07/05/22 0751  BP: (!) 148/70 (!) 143/60  (!) 152/98  Pulse: 75 74  75  Resp: 20 20  18   Temp: 98.6 F (37 C) 98.2 F (36.8 C)  98.8 F (37.1 C)  TempSrc: Oral Oral  Oral  SpO2: 90% 95%  (!) 9%  Weight:   89 kg   Height:       SpO2: (!) 9 % O2 Flow Rate (L/min): 2 L/min   Intake/Output Summary (Last 24 hours) at 07/05/2022 1139 Last data filed at 07/05/2022 0200 Gross per 24 hour  Intake 240 ml  Output 3300 ml  Net -3060 ml   Filed Weights   07/02/22 0400 07/04/22 0424 07/05/22 0500  Weight: 89.8 kg 89.8 kg 89 kg    Exam: General exam: In no acute distress. Respiratory system: Good air movement and clear to auscultation. Cardiovascular system: S1 & S2 heard, RRR. No JVD. Gastrointestinal system: Abdomen is nondistended, soft and nontender.  Extremities: No pedal edema. Skin: No rashes, lesions or ulcers  Data Reviewed:    Labs: Basic Metabolic Panel: Recent Labs  Lab 06/30/22 0614 07/01/22 0500 07/03/22 0356 07/04/22 0406 07/05/22 0452  NA 137 136 138 139 140  K 4.1 4.2 4.7 4.8 4.5  CL 108 109 110 111 109  CO2 20* 22 22 22 22   GLUCOSE 115* 91 79 93 94  BUN 54* 58* 60* 54* 46*  CREATININE 4.39* 4.88* 5.36* 5.23* 4.46*  CALCIUM 8.1* 8.3* 8.4* 8.6* 8.8*   GFR Estimated Creatinine Clearance (by C-G formula based on SCr of 4.46 mg/dL (H)) Female: 12 mL/min (A) Female: 14.8 mL/min (A) Liver Function Tests: Recent Labs  Lab  06/29/22 0408 06/30/22 0614 07/01/22 0500 07/04/22 0406  AST 16 13* 13* 15  ALT 13 13 11 13   ALKPHOS 86 82 78 94  BILITOT 0.6 0.5 0.6 0.3  PROT 6.4* 5.8* 6.0* 6.5  ALBUMIN 2.2* 2.1* 2.1* 2.2*   No results for input(s): "LIPASE", "AMYLASE" in the last 168 hours. No results for input(s): "AMMONIA" in the last 168 hours. Coagulation profile No results for input(s): "INR", "PROTIME" in the last 168 hours. COVID-19 Labs  No results for input(s): "DDIMER", "FERRITIN", "LDH", "CRP" in the last 72 hours.  Lab Results  Component Value Date   SARSCOV2NAA NEGATIVE 03/25/2022   SARSCOV2NAA NEGATIVE 01/07/2022   SARSCOV2NAA POSITIVE (A) 12/01/2021   [place] NEGATIVE 10/16/2021    CBC: Recent Labs  Lab 06/30/22 0614 07/01/22 0500 07/03/22 0356 07/04/22 0406 07/05/22 0452  WBC 9.2 6.5 5.7 5.0 5.6  HGB 8.7* 8.5* 7.7* 8.2* 8.4*  HCT 28.0* 27.6* 24.2* 26.3* 26.3*  MCV 90.0  90.2 90.6 89.5 88.9  PLT 138* 128* 115* 131* 155   Cardiac Enzymes: Recent Labs  Lab 07/01/22 0500  CKTOTAL 51   BNP (last 3 results) No results for input(s): "PROBNP" in the last 8760 hours. CBG: Recent Labs  Lab 07/04/22 1628 07/04/22 2054 07/05/22 0005 07/05/22 0321 07/05/22 0805  GLUCAP 111* 122* 92 95 81   D-Dimer: No results for input(s): "DDIMER" in the last 72 hours. Hgb A1c: No results for input(s): "HGBA1C" in the last 72 hours. Lipid Profile: No results for input(s): "CHOL", "HDL", "LDLCALC", "TRIG", "CHOLHDL", "LDLDIRECT" in the last 72 hours. Thyroid function studies: No results for input(s): "TSH", "T4TOTAL", "T3FREE", "THYROIDAB" in the last 72 hours.  Invalid input(s): "FREET3" Anemia work up: No results for input(s): "VITAMINB12", "FOLATE", "FERRITIN", "TIBC", "IRON", "RETICCTPCT" in the last 72 hours. Sepsis Labs: Recent Labs  Lab 07/01/22 0500 07/03/22 0356 07/04/22 0406 07/05/22 0452  WBC 6.5 5.7 5.0 5.6   Microbiology Recent Results (from the past 240 hour(s))   Surgical pcr screen     Status: Abnormal   Collection Time: 06/28/22  2:19 PM   Specimen: Nasal Mucosa; Nasal Swab  Result Value Ref Range Status   MRSA, PCR NEGATIVE NEGATIVE Final   Staphylococcus aureus POSITIVE (A) NEGATIVE Final    Comment: (NOTE) The Xpert SA Assay (FDA approved for NASAL specimens in patients 77 years of age and older), is one component of a comprehensive surveillance program. It is not intended to diagnose infection nor to guide or monitor treatment. Performed at [facility], [place] [address] [place], [place] [address]   Aerobic/Anaerobic Culture w Gram Stain (surgical/deep wound)     Status: None   Collection Time: 06/28/22  3:34 PM   Specimen: PATH Other; Tissue  Result Value Ref Range Status   Specimen Description WOUND  Final   Special Requests PARASPINAL MUSCLE TEST CLINDA  Final   Gram Stain NO WBC SEEN NO ORGANISMS SEEN   Final   Culture   Final    No growth aerobically or anaerobically. Performed at [facility], [place] [address], [place], [place] [address]    Report Status 07/03/2022 FINAL  Final  Aerobic/Anaerobic Culture w Gram Stain (surgical/deep wound)     Status: None   Collection Time: 06/28/22  4:12 PM   Specimen: PATH Other; Tissue  Result Value Ref Range Status   Specimen Description WOUND  Final   Special Requests THORACIC DISC SPACE  Final   Gram Stain NO WBC SEEN NO ORGANISMS SEEN   Final   Culture   Final    No growth aerobically or anaerobically. Performed at [facility], [place] [address] [place], [place] [address]    Report Status 07/03/2022 FINAL  Final  Aerobic/Anaerobic Culture w Gram Stain (surgical/deep wound)     Status: None   Collection Time: 06/28/22  4:15 PM   Specimen: PATH Other; Tissue  Result Value Ref Range Status   Specimen Description SITE NOT SPECIFIED  Final   Special Requests  SCREWS , CAPS , RODS  Final   Gram Stain   Final    RARE WBC PRESENT, PREDOMINANTLY MONONUCLEAR NO  ORGANISMS SEEN    Culture   Final    No growth aerobically or anaerobically. Performed at [facility], [place] [address] [place], [place] [address]    Report Status 07/04/2022 FINAL  Final     Medications:    sodium chloride   Intravenous Once  amLODipine  5 mg Oral Daily   carvedilol  12.5 mg Oral Daily   Chlorhexidine Gluconate Cloth  6 each Topical Daily   cholecalciferol  5,000 Units Oral Daily   darbepoetin (ARANESP) injection - NON-DIALYSIS  40 mcg Subcutaneous Q Mon-1800   DULoxetine  60 mg Oral Daily   enoxaparin (LOVENOX) injection  90 mg Subcutaneous Q24H   feeding supplement (GLUCERNA SHAKE)  237 mL Oral TID BM   folic acid  2 mg Oral Daily   insulin aspart  0-9 Units Subcutaneous Q4H   insulin glargine-yfgn  8 Units Subcutaneous QHS   lidocaine       mometasone-formoterol  2 puff Inhalation BID   montelukast  10 mg Oral QHS   multivitamin with minerals  1 tablet Oral Daily   mupirocin cream   Topical Daily   pregabalin  25 mg Oral TID   sodium bicarbonate  650 mg Oral BID   sodium chloride flush  3 mL Intravenous Q12H   tamsulosin  0.4 mg Oral Daily   Continuous Infusions:  sodium chloride     DAPTOmycin (CUBICIN) 500 mg in sodium chloride 0.9 % IVPB        LOS: 15 days   Charlynne Cousins  [facility]  07/05/2022, 11:39 AM

## 2022-07-05 NOTE — Procedures (Signed)
Interventional Radiology Procedure Note  Procedure: Placement of a left IJ approach tunneled cuffed CVC, 25cm. Tip is positioned at the superior cavoatrial junction and catheter is ready for immediate use.  Complications: None Recommendations:  - Ok to use - Do not submerge - Routine line care   Signed,  Dulcy Fanny. Earleen Newport, DO

## 2022-07-06 DIAGNOSIS — M869 Osteomyelitis, unspecified: Secondary | ICD-10-CM | POA: Diagnosis not present

## 2022-07-06 DIAGNOSIS — I82503 Chronic embolism and thrombosis of unspecified deep veins of lower extremity, bilateral: Secondary | ICD-10-CM | POA: Diagnosis not present

## 2022-07-06 DIAGNOSIS — N179 Acute kidney failure, unspecified: Secondary | ICD-10-CM | POA: Diagnosis not present

## 2022-07-06 DIAGNOSIS — I10 Essential (primary) hypertension: Secondary | ICD-10-CM | POA: Diagnosis not present

## 2022-07-06 LAB — BASIC METABOLIC PANEL
Anion gap: 5 (ref 5–15)
BUN: 43 mg/dL — ABNORMAL HIGH (ref 8–23)
CO2: 25 mmol/L (ref 22–32)
Calcium: 8.6 mg/dL — ABNORMAL LOW (ref 8.9–10.3)
Chloride: 109 mmol/L (ref 98–111)
Creatinine, Ser: 4.15 mg/dL — ABNORMAL HIGH (ref 0.44–1.00)
GFR, Estimated: 11 mL/min — ABNORMAL LOW (ref >60)
Glucose, Bld: 127 mg/dL — ABNORMAL HIGH (ref 70–99)
Potassium: 4.7 mmol/L (ref 3.5–5.1)
Sodium: 139 mmol/L (ref 135–145)

## 2022-07-06 LAB — GLUCOSE, CAPILLARY
Glucose-Capillary: 103 mg/dL — ABNORMAL HIGH (ref 70–99)
Glucose-Capillary: 109 mg/dL — ABNORMAL HIGH (ref 70–99)
Glucose-Capillary: 137 mg/dL — ABNORMAL HIGH (ref 70–99)
Glucose-Capillary: 82 mg/dL (ref 70–99)
Glucose-Capillary: 84 mg/dL (ref 70–99)
Glucose-Capillary: 96 mg/dL (ref 70–99)

## 2022-07-06 MED ORDER — DARBEPOETIN ALFA 60 MCG/0.3ML IJ SOSY: 60.0000 ug | PREFILLED_SYRINGE | INTRAMUSCULAR | Status: DC

## 2022-07-06 MED ORDER — FUROSEMIDE 20 MG PO TABS
20.0000 mg | ORAL_TABLET | Freq: Every day | ORAL | Status: DC
  Administered 2022-07-07 – 2022-07-11 (×5): 20 mg via ORAL
  Filled 2022-07-06 (×5): qty 1

## 2022-07-06 NOTE — Progress Notes (Signed)
[facility] Progress Note  Name: [name] [name] MRN: [id number] DOB: [birth year]   Subjective:  She had 2.9 liters UOP over 9/6 charted.  She has had more back pain today.  Lunch is in front of her and she hasn't really felt like touching it.  Does feel like she's getting a little swollen.   Baseline creatinine recently has been around 4 and was up to 5.36 at time of re-consult.  Similar aki event in 01/2020.   Review of systems:    Visually impaired Denies shortness of breath Still with Nausea  Diarrhea resolved Still has the foley     Intake/Output Summary (Last 24 hours) at 07/06/2022 1214 Last data filed at 07/06/2022 0808 Gross per 24 hour  Intake 300 ml  Output 2905 ml  Net -2605 ml    Vitals:  Vitals:   07/06/22 0341 07/06/22 0500 07/06/22 0750 07/06/22 1201  BP: (!) 162/70  (!) 169/64 (!) 153/61  Pulse: 70  66 63  Resp: _0 Temp: 98.8 F (37.1 C)  97.9 F (36.6 C) 97.7 F (36.5 C)  TempSrc: Oral  Oral Oral  SpO2: 95%  95% 94%  Weight:  89.5 kg    Height:         Physical Exam:    General adult female in bed in no acute distress HEENT normocephalic atraumatic extraocular movements intact sclera anicteric Neck supple trachea midline Lungs clear to auscultation bilaterally normal work of breathing at rest; on room air Heart S1S2 no rub Abdomen soft nontender nondistended Extremities trace edema, greater in arms Psych normal mood and affect Neuro visually impaired; awake and alert provides basic hx Gu foley in place with pale yellow urine  Medications reviewed   Labs:     Latest Ref Rng & Units 07/05/2022    4:52 AM 07/04/2022    4:06 AM 07/03/2022    3:56 AM  BMP  Glucose 70 - 99 mg/dL 94  93  79   BUN 8 - 23 mg/dL 46  54  60   Creatinine 0.44 - 1.00 mg/dL 4.46  5.23  5.36   Sodium 135 - 145 mmol/L 140  139  138   Potassium 3.5 - 5.1 mmol/L 4.5  4.8  4.7   Chloride 98 - 111 mmol/L 109  111  110   CO2 22 - 32 mmol/L _1 Calcium 8.9 - 10.3 mg/dL 8.8  8.6  8.4      Assessment/Plan:   AKI   Now with recurrent AKI  Earlier with AKI in setting of urinary retention, n/v w/ new vertebral discitis/ poss osteomyelitis and poss UTI. UA showed ^wbc's. CT w/o hydronephrosis. Suspected AKI due to vol depletion + urinary retention.  Continue foley catheter  Await labs from today  Creatinine has plateaued thankfully and urine output improved.   Lasix 20 mg PO daily to start tomorrow.  (Note that lasix 40 mg daily is listed her home dose but she normally just takes 20 mg daily for reference.)   Would transition to an alternative to cymbalta given her advanced CKD  CKD stage IV - baseline creat is 2.6- 3.0 from march 2023, eGFR 16-20 ml/min. More recently Cr closer to 4.  She is followed by Dr Carolin Sicks at [facility]. 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 abx per pmd Anemia  CKD - defer Iv iron in setting of infection. Started ESA - aranesp 40 mcg every week on Mondays.  Increase to 60 mcg for the next dose.  PRBC's per primary team.  Please note that she does not have a renal transplant.  pt states she was adopted and that everybody has always told her that when she was a kid she had a kidney transplant. She is not on any transplant meds, there is no physical scar on the abdomen and no transplant kidney apparent on any of her abdominal CT scans. This has been discussed with the patient by multiple providers including at least the previous nephrologist on service this admission, me personally in 01/2020, me personally on re-consult, and Dr. Florene Glen more remotely prior to her transition to Dr. Carolin Sicks upon Dr. Abel Presto retirement.    Disposition - continue inpatient monitoring  [name], MD 07/06/2022 12:26 PM

## 2022-07-06 NOTE — Plan of Care (Signed)
  Problem: Skin Integrity: Goal: Risk for impaired skin integrity will decrease Outcome: Progressing   Problem: Clinical Measurements: Goal: Will remain free from infection Outcome: Progressing Goal: Respiratory complications will improve Outcome: Progressing   Problem: Activity: Goal: Risk for activity intolerance will decrease Outcome: Progressing   Problem: Coping: Goal: Level of anxiety will decrease Outcome: Progressing

## 2022-07-06 NOTE — Progress Notes (Signed)
Physical Therapy Treatment Patient Details Name: [name] [name] MRN: [id number] DOB: [birth year] Today's Date: 07/06/2022   History of Present Illness Pt is a 66 y.o. female presenting 8/30 s/p posterior exploration/revision of posteriuor thoracic 11- Lumbar 3, extension of fusion T9-11 with vertebral augmentation. PMH significant for spinal surgery, chronic diastolic heart failure, CKD stage IV, renal transplant, chronic DVTs, DMII, and blindness.    PT Comments    Patient progressing slowly still limited by pain and nausea, weakness.  She progressed OOB to chair today and was able to direct her care for sitting side of bed till dizziness better and for repositioning back brace.  She seems appropriate for STSNF, but has refused.  If d/c home recommend HHPT and [facility] aide.  PT will continue to follow acutely.    Recommendations for follow up therapy are one component of a multi-disciplinary discharge planning process, led by the attending physician.  Recommendations may be updated based on patient status, additional functional criteria and insurance authorization.  Follow Up Recommendations  Skilled nursing-short term rehab (<3 hours/day) Can patient physically be transported by private vehicle: No   Assistance Recommended at Discharge Frequent or constant Supervision/Assistance  Patient can return home with the following Two people to help with walking and/or transfers;A lot of help with bathing/dressing/bathroom;Assistance with cooking/housework;Assist for transportation;Help with stairs or ramp for entrance   Equipment Recommendations       Recommendations for Other Services       Precautions / Restrictions Precautions Precautions: Fall;Back Required Braces or Orthoses: Spinal Brace Spinal Brace: Thoracolumbosacral orthotic;Applied in sitting position     Mobility  Bed Mobility Overal bed mobility: Needs Assistance Bed Mobility: Rolling, Sidelying to Sit Rolling: Min  assist Sidelying to sit: Mod assist, +2 for safety/equipment       General bed mobility comments: cues for technique, assist for legs off bed and to lift trunk upright    Transfers Overall transfer level: Needs assistance Equipment used: Rolling walker (2 wheels) Transfers: Sit to/from Stand, Bed to chair/wheelchair/BSC Sit to Stand: +2 safety/equipment, Mod assist   Step pivot transfers: Mod assist, +2 physical assistance       General transfer comment: stood to RW and stepping to recliner with mod cues for direction and placement; declined further ambulation due to nausea, dizziness and pain    Ambulation/Gait                   Stairs             Wheelchair Mobility    Modified Rankin (Stroke Patients Only)       Balance Overall balance assessment: Needs assistance Sitting-balance support: Bilateral upper extremity supported, Single extremity supported Sitting balance-Leahy Scale: Poor Sitting balance - Comments: initially reliant on UE support and when resting arm for BP measurement provided mod A for back support   Standing balance support: Bilateral upper extremity supported, Reliant on assistive device for balance Standing balance-Leahy Scale: Poor Standing balance comment: UE support for balance                            Cognition Arousal/Alertness: Awake/alert Behavior During Therapy: Anxious Overall Cognitive Status: No family/caregiver present to determine baseline cognitive functioning Area of Impairment: Attention, Following commands, Safety/judgement, Problem solving                   Current Attention Level: Sustained   Following Commands: Follows one step commands consistently,  Follows one step commands with increased time Safety/Judgement: Decreased awareness of safety   Problem Solving: Slow processing, Requires verbal cues          Exercises General Exercises - Lower Extremity Ankle Circles/Pumps: AROM,  10 reps, Both, Supine Heel Slides: AROM, Both, 5 reps, Supine Other Exercises Other Exercises: lateral trunk rotation in supine with A x 5 reps    General Comments General comments (skin integrity, edema, etc.): VSS; cues for stationary target for helping dizziness as BP not low.      Pertinent Vitals/Pain Pain Assessment Faces Pain Scale: Hurts whole lot Pain Location: generalized; back Pain Descriptors / Indicators: Discomfort, Operative site guarding, Aching Pain Intervention(s): Monitored during session, Repositioned, Patient requesting pain meds-RN notified    Home Living                          Prior Function            PT Goals (current goals can now be found in the care plan section) Progress towards PT goals: Progressing toward goals    Frequency    Min 5X/week      PT Plan Current plan remains appropriate    Co-evaluation              AM-PAC PT "6 Clicks" Mobility   Outcome Measure  Help needed turning from your back to your side while in a flat bed without using bedrails?: A Lot Help needed moving from lying on your back to sitting on the side of a flat bed without using bedrails?: A Lot Help needed moving to and from a bed to a chair (including a wheelchair)?: Total Help needed standing up from a chair using your arms (e.g., wheelchair or bedside chair)?: A Lot Help needed to walk in hospital room?: Total Help needed climbing 3-5 steps with a railing? : Total 6 Click Score: 9    End of Session Equipment Utilized During Treatment: Gait belt;Back brace Activity Tolerance: Patient limited by pain;Other (comment) (limited by nausea and dizziness) Patient left: in chair;with call bell/phone within reach;with chair alarm set   PT Visit Diagnosis: Unsteadiness on feet (R26.81);Pain;Muscle weakness (generalized) (M62.81);Other abnormalities of gait and mobility (R26.89) Pain - part of body:  (back)     Time: 5436-0677 PT Time  Calculation (min) (ACUTE ONLY): 31 min  Charges:  $Therapeutic Activity: 23-37 mins                     Magda Kiel, PT Acute Rehabilitation Services Office:[phone] 07/06/2022'   Reginia Naas 07/06/2022, 2:38 PM

## 2022-07-06 NOTE — Progress Notes (Signed)
Subjective: Patient reports back/incisional pain with ambulation. NAE ON.  Objective: Vital signs in last 24 hours: Temp:  [97.6 F (36.4 C)-98.8 F (37.1 C)] 97.9 F (36.6 C) (09/07 0750) Pulse Rate:  [66-76] 66 (09/07 0750) Resp:  [15-19] 15 (09/07 0750) BP: (151-175)/(64-77) 169/64 (09/07 0750) SpO2:  [93 %-97 %] 95 % (09/07 0750) Weight:  [89.5 kg] 89.5 kg (09/07 0500)  Intake/Output from previous day: 09/06 0701 - 09/07 0700 In: 240 [P.O.:240] Out: 2930 [Urine:2930] Intake/Output this shift: Total I/O In: 60 [IV Piggyback:60] Out: -   Physical Exam: Patient is awake, A/O X 4, conversant, and in good spirits. Eyes open spontaneously. They are in NAD and VSS. Doing well. Speech is fluent and appropriate. MAEW. BUE 5/5 throughout, BLE 4/5 proximally 4+/5 distally. Sensation to light touch is intact. PERLA, EOMI. CNs grossly intact. Dressing is clean dry intact. Incision is well approximated with no drainage, erythema, or edema.  Lab Results: Recent Labs    07/04/22 0406 07/05/22 0452  WBC 5.0 5.6  HGB 8.2* 8.4*  HCT 26.3* 26.3*  PLT 131* 155   BMET Recent Labs    07/04/22 0406 07/05/22 0452  NA 139 140  K 4.8 4.5  CL 111 109  CO2 22 22  GLUCOSE 93 94  BUN 54* 46*  CREATININE 5.23* 4.46*  CALCIUM 8.6* 8.8*    Studies/Results: IR Fluoro Guide CV Line Left  Result Date: 07/05/2022 INDICATION: 66 year old female referred for tunneled central venous catheter EXAM: IMAGE GUIDED PLACEMENT OF TUNNELED CENTRAL VENOUS CATHETER MEDICATIONS: None ANESTHESIA/SEDATION: None FLUOROSCOPY: Radiation Exposure Index (as provided by the fluoroscopic device): 5 mGy Kerma COMPLICATIONS: None PROCEDURE: Informed written consent was obtained from the patient after a thorough discussion of the procedural risks, benefits and alternatives. All questions were addressed. Maximal Sterile Barrier Technique was utilized including caps, mask, sterile gowns, sterile gloves, sterile drape, hand  hygiene and skin antiseptic. A timeout was performed prior to the initiation of the procedure. Patient was placed in the supine position on angiographic table. Patency of the left internal jugular vein was confirmed with ultrasound with image documentation. We elected to use the left chest, as the patient has an open wound on the right anterior chest wall. Patient was prepped and draped in the usual sterile fashion including the left neck and superior chest. Using ultrasound guidance, the skin and subcutaneous tissues overlying the vein were generously infiltrated with 1% lidocaine without epinephrine. Using ultrasound guidance, the left jugular vein was punctured with a micropuncture needle, and an 018 wire was advanced into the right heart confirming venous access. A small stab incision was made with an 11 blade scalpel. Peel-away sheath was placed over the wire, and then the wire was removed, marking the wire for estimation of internal catheter length. The chest wall was then generously infiltrated with 1% lidocaine for local anesthesia along the tissue tract. Small stab incision was made with 11 blade scalpel, and then the catheter was back tunneled to the puncture site. Catheter was pulled through the tract, with the catheter amputated at 25 cm. Catheter was advanced through the peel-away sheath, and the peel-away sheath was removed. Final image was stored. The catheter was anchored to the chest wall with 2 retention sutures, and Derma bond was used to seal the jugular vein puncture site and at the chest wall. Patient tolerated the procedure well and remained hemodynamically stable throughout. No complications were encountered and no significant blood loss was encountered. IMPRESSION: Status post image guided  placement of left internal jugular vein tunneled cuffed central venous catheter. Signed, Dulcy Fanny. Nadene Rubins, RPVI [facility] Special[facility] Radiology  Electronically Signed   By: Corrie Mckusick D.O.   On: 07/05/2022 13:58   IR US Guide Vasc Access Left  Result Date: 07/05/2022 INDICATION: 66 year old female referred for tunneled central venous catheter EXAM: IMAGE GUIDED PLACEMENT OF TUNNELED CENTRAL VENOUS CATHETER MEDICATIONS: None ANESTHESIA/SEDATION: None FLUOROSCOPY: Radiation Exposure Index (as provided by the fluoroscopic device): 5 mGy Kerma COMPLICATIONS: None PROCEDURE: Informed written consent was obtained from the patient after a thorough discussion of the procedural risks, benefits and alternatives. All questions were addressed. Maximal Sterile Barrier Technique was utilized including caps, mask, sterile gowns, sterile gloves, sterile drape, hand hygiene and skin antiseptic. A timeout was performed prior to the initiation of the procedure. Patient was placed in the supine position on angiographic table. Patency of the left internal jugular vein was confirmed with ultrasound with image documentation. We elected to use the left chest, as the patient has an open wound on the right anterior chest wall. Patient was prepped and draped in the usual sterile fashion including the left neck and superior chest. Using ultrasound guidance, the skin and subcutaneous tissues overlying the vein were generously infiltrated with 1% lidocaine without epinephrine. Using ultrasound guidance, the left jugular vein was punctured with a micropuncture needle, and an 018 wire was advanced into the right heart confirming venous access. A small stab incision was made with an 11 blade scalpel. Peel-away sheath was placed over the wire, and then the wire was removed, marking the wire for estimation of internal catheter length. The chest wall was then generously infiltrated with 1% lidocaine for local anesthesia along the tissue tract. Small stab incision was made with 11 blade scalpel, and then the catheter was back tunneled to the puncture site. Catheter was pulled through the  tract, with the catheter amputated at 25 cm. Catheter was advanced through the peel-away sheath, and the peel-away sheath was removed. Final image was stored. The catheter was anchored to the chest wall with 2 retention sutures, and Derma bond was used to seal the jugular vein puncture site and at the chest wall. Patient tolerated the procedure well and remained hemodynamically stable throughout. No complications were encountered and no significant blood loss was encountered. IMPRESSION: Status post image guided placement of left internal jugular vein tunneled cuffed central venous catheter. Signed, Dulcy Fanny. Nadene Rubins, RPVI [facility] Interventional Radiology Specialists [facility] Radiology Electronically Signed   By: Corrie Mckusick D.O.   On: 07/05/2022 13:58    Assessment/Plan: 66 y.o. female who is s/p posterior exploration  and revision of T11-L3 fusion with extension of posterior arthrodesis T8-9, T9-10, T10-11 with Dr. 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 She is doing well and has appropriate surgical pain. Neuro exam is stable. BLE weakness secondary to deconditioning, improving. PT/OT recommending SNF for continued convalescence. Continue working on pain control, mobility and ambulating patient.   -Ok to resume Eliquis    LOS: 16 days     Marvis Moeller, DNP, AGNP-C Neurosurgery Nurse Practitioner  [facility] [address], [place], [place] [address] P: [phone]    F: [phone]  07/06/2022 8:41 AM

## 2022-07-06 NOTE — Progress Notes (Addendum)
[facility] PROGRESS NOTE    Progress Note  [name] [name]  QVZ:[id number] DOB: [birth year] DOA: 06/20/2022 PCP: Ann Held, DO     Brief Narrative:   [name] [name] is an 66 y.o. 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 Neurosurgery, ID and interventional radiology were consulted. Patient underwent IR aspiration on 06/23/2022, that grew staph epi on cultures.  Started on IV daptomycin.  Due to difficult access tunneled catheter was placed on 07/04/2022 Patient had worsening creatinine nephrology was reconsulted.   Assessment/Plan:   Recurrent acute kidney injury on chronic kidney disease stage IV: With a baseline creatinine of 2.6-3, increased to 5.8. Nephrology has been consulted and has been managing diuretics. Lasix was held yesterday, basic metabolic panels pending this morning. 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 Physical therapy evaluated the patient, will need short-term rehab once her creatinine is improved.  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  She will complete her treatment of IV daptomycin until 09/18/2022. CT scan on admission showed discitis and osteomyelitis of T10-11. IR consulted for central tunneled catheter given chronic kidney disease.   ID signed off on 9 04/03/2022 Neurosurgery saw the patient underwent posterior  arthrodesis of T8-3 11 and removal of T11 pedicle screws and removal of rods. Continue IV narcotics for pain control. Antibiotics duration per ID.  Metabolic acidosis: Continue bicarbonate tablets.  Chronic DVT: At home was on Eliquis it was held on 06/18/2022 Underwent surgical intervention and drain has been removed. Neurosurgery recommended to restart Eliquis on 07/03/2022 however renal function worsen. Eliquis has been discontinued now on Lovenox full dose per pharmacy.  Diabetes mellitus type 2: Fairly controlled continue current regimen.  Chronic diastolic heart failure: Appears euvolemic continue to hold Lasix as renal functioning was worsening.  Anxiety/depression: Continue Cymbalta and Valium.  Hyperkalemia: Now resolved.   DVT prophylaxis: Lovenox Family Communication:none Status is: Inpatient Remains inpatient appropriate because: Awaiting placement to CIR.    Code Status:     Code Status Orders  (From admission, onward)           Start     Ordered   06/21/22 0021  Full code  Continuous        06/21/22 0025           Code Status History     Date Active Date Inactive Code Status Order ID Comments User Context   01/07/2022 0511 01/09/2022 2045 Full Code [id number]  Rhetta Mura DO Inpatient   10/17/2021 0420 10/22/2021 1928 Full Code [id number]  Chotiner, Yevonne Aline, MD ED   02/18/2020 2241 02/26/2020 2334 Full Code [id number]  Dwyane Dee, MD Inpatient   01/29/2020 1704 02/18/2020 2044 Full Code [id number]  Cathlyn Parsons, PA-C Inpatient   01/29/2020 1704 01/29/2020 1704 Full Code [id number]  Cathlyn Parsons, PA-C Inpatient   01/22/2020 2141 01/29/2020 1650 Full Code [id number]  Vallarie Mare, MD Inpatient   01/21/2020 0012 01/22/2020 2141 Full  Code [id number]  Rise Patience, MD Inpatient   11/15/2019 2200 11/18/2019 2226 Full Code [id number]  Jani Gravel, MD ED   04/15/2019 2122 04/21/2019 2320 Full Code [id number]  Elwyn Reach, MD Inpatient    04/06/2017 2004 04/11/2017 2109 Full Code [id number]  Edwin Dada, MD Inpatient   09/29/2016 1831 10/02/2016 1643 Full Code [id number]  [name], MD Inpatient   03/19/2013 1507 03/21/2013 1812 Full Code [id number]  [name], MD Inpatient   02/10/2013 2124 02/20/2013 1859 Full Code [id number]  [name], MD Inpatient   02/03/2013 2137 02/10/2013 2124 Full Code [id number]  Bynum Bellows, MD Inpatient   01/11/2012 0612 01/13/2012 1645 Full Code [id number]  Herb Grays, RN Inpatient         IV Access:   Peripheral IV   Procedures and diagnostic studies:   IR Fluoro Guide CV Line Left  Result Date: 07/05/2022 INDICATION: 66 year old female referred for tunneled central venous catheter EXAM: IMAGE GUIDED PLACEMENT OF TUNNELED CENTRAL VENOUS CATHETER MEDICATIONS: None ANESTHESIA/SEDATION: None FLUOROSCOPY: Radiation Exposure Index (as provided by the fluoroscopic device): 5 mGy Kerma COMPLICATIONS: None PROCEDURE: Informed written consent was obtained from the patient after a thorough discussion of the procedural risks, benefits and alternatives. All questions were addressed. Maximal Sterile Barrier Technique was utilized including caps, mask, sterile gowns, sterile gloves, sterile drape, hand hygiene and skin antiseptic. A timeout was performed prior to the initiation of the procedure. Patient was placed in the supine position on angiographic table. Patency of the left internal jugular vein was confirmed with ultrasound with image documentation. We elected to use the left chest, as the patient has an open wound on the right anterior chest wall. Patient was prepped and draped in the usual sterile fashion including the left neck and superior chest. Using ultrasound guidance, the skin and subcutaneous tissues overlying the vein were generously infiltrated with 1% lidocaine without epinephrine. Using ultrasound guidance, the left jugular vein was punctured with a micropuncture needle,  and an 018 wire was advanced into the right heart confirming venous access. A small stab incision was made with an 11 blade scalpel. Peel-away sheath was placed over the wire, and then the wire was removed, marking the wire for estimation of internal catheter length. The chest wall was then generously infiltrated with 1% lidocaine for local anesthesia along the tissue tract. Small stab incision was made with 11 blade scalpel, and then the catheter was back tunneled to the puncture site. Catheter was pulled through the tract, with the catheter amputated at 25 cm. Catheter was advanced through the peel-away sheath, and the peel-away sheath was removed. Final image was stored. The catheter was anchored to the chest wall with 2 retention sutures, and Derma bond was used to seal the jugular vein puncture site and at the chest wall. Patient tolerated the procedure well and remained hemodynamically stable throughout. No complications were encountered and no significant blood loss was encountered. IMPRESSION: Status post image guided placement of left internal jugular vein tunneled cuffed central venous catheter. Signed, Dulcy Fanny. Nadene Rubins, RPVI Vascular and Interventional Radiology Specialists [facility] Radiology Electronically Signed   By: Corrie Mckusick D.O.   On: 07/05/2022 13:58   IR US Guide Vasc Access Left  Result Date: 07/05/2022 INDICATION: 66 year old female referred for tunneled central venous catheter EXAM: IMAGE GUIDED PLACEMENT OF TUNNELED CENTRAL VENOUS CATHETER MEDICATIONS: None ANESTHESIA/SEDATION: None FLUOROSCOPY: Radiation Exposure Index (as provided by the fluoroscopic device): 5 mGy Kerma  COMPLICATIONS: None PROCEDURE: Informed written consent was obtained from the patient after a thorough discussion of the procedural risks, benefits and alternatives. All questions were addressed. Maximal Sterile Barrier Technique was utilized including caps, mask, sterile gowns, sterile gloves, sterile  drape, hand hygiene and skin antiseptic. A timeout was performed prior to the initiation of the procedure. Patient was placed in the supine position on angiographic table. Patency of the left internal jugular vein was confirmed with ultrasound with image documentation. We elected to use the left chest, as the patient has an open wound on the right anterior chest wall. Patient was prepped and draped in the usual sterile fashion including the left neck and superior chest. Using ultrasound guidance, the skin and subcutaneous tissues overlying the vein were generously infiltrated with 1% lidocaine without epinephrine. Using ultrasound guidance, the left jugular vein was punctured with a micropuncture needle, and an 018 wire was advanced into the right heart confirming venous access. A small stab incision was made with an 11 blade scalpel. Peel-away sheath was placed over the wire, and then the wire was removed, marking the wire for estimation of internal catheter length. The chest wall was then generously infiltrated with 1% lidocaine for local anesthesia along the tissue tract. Small stab incision was made with 11 blade scalpel, and then the catheter was back tunneled to the puncture site. Catheter was pulled through the tract, with the catheter amputated at 25 cm. Catheter was advanced through the peel-away sheath, and the peel-away sheath was removed. Final image was stored. The catheter was anchored to the chest wall with 2 retention sutures, and Derma bond was used to seal the jugular vein puncture site and at the chest wall. Patient tolerated the procedure well and remained hemodynamically stable throughout. No complications were encountered and no significant blood loss was encountered. IMPRESSION: Status post image guided placement of left internal jugular vein tunneled cuffed central venous catheter. Signed, Dulcy Fanny. Nadene Rubins, RPVI Vascular and Interventional Radiology Specialists [facility] Radiology  Electronically Signed   By: Corrie Mckusick D.O.   On: 07/05/2022 13:58     Medical Consultants:   None.   Subjective:    [name] [name] relates her pain is fairly controlled.  Objective:    Vitals:   07/06/22 0020 07/06/22 0341 07/06/22 0500 07/06/22 0750  BP: (!) 157/77 (!) 162/70  (!) 169/64  Pulse: 73 70  66  Resp: 18 19  15   Temp: 98.6 F (37 C) 98.8 F (37.1 C)  97.9 F (36.6 C)  TempSrc: Oral Oral  Oral  SpO2: 93% 95%  95%  Weight:   89.5 kg   Height:       SpO2: 95 % O2 Flow Rate (L/min): 2 L/min   Intake/Output Summary (Last 24 hours) at 07/06/2022 0758 Last data filed at 07/06/2022 0200 Gross per 24 hour  Intake 240 ml  Output 2930 ml  Net -2690 ml    Filed Weights   07/04/22 0424 07/05/22 0500 07/06/22 0500  Weight: 89.8 kg 89 kg 89.5 kg    Exam: General exam: In no acute distress. Respiratory system: Good air movement and clear to auscultation. Cardiovascular system: S1 & S2 heard, RRR. No JVD. Gastrointestinal system: Abdomen is nondistended, soft and nontender.  Extremities: No pedal edema. Skin: No rashes, lesions or ulcers Psychiatry: Judgement and insight appear normal. Mood & affect appropriate.  Data Reviewed:    Labs: Basic Metabolic Panel: Recent Labs  Lab 06/30/22 0614 07/01/22 0500 07/03/22  3903 07/04/22 0406 07/05/22 0452  NA 137 136 138 139 140  K 4.1 4.2 4.7 4.8 4.5  CL 108 109 110 111 109  CO2 20* 22 22 22 22   GLUCOSE 115* 91 79 93 94  BUN 54* 58* 60* 54* 46*  CREATININE 4.39* 4.88* 5.36* 5.23* 4.46*  CALCIUM 8.1* 8.3* 8.4* 8.6* 8.8*    GFR Estimated Creatinine Clearance (by C-G formula based on SCr of 4.46 mg/dL (H)) Female: 12.1 mL/min (A) Female: 14.8 mL/min (A) Liver Function Tests: Recent Labs  Lab 06/30/22 0614 07/01/22 0500 07/04/22 0406  AST 13* 13* 15  ALT 13 11 13   ALKPHOS 82 78 94  BILITOT 0.5 0.6 0.3  PROT 5.8* 6.0* 6.5  ALBUMIN 2.1* 2.1* 2.2*    No results for input(s): "LIPASE",  "AMYLASE" in the last 168 hours. No results for input(s): "AMMONIA" in the last 168 hours. Coagulation profile No results for input(s): "INR", "PROTIME" in the last 168 hours. COVID-19 Labs  No results for input(s): "DDIMER", "FERRITIN", "LDH", "CRP" in the last 72 hours.  Lab Results  Component Value Date   SARSCOV2NAA NEGATIVE 03/25/2022   SARSCOV2NAA NEGATIVE 01/07/2022   SARSCOV2NAA POSITIVE (A) 12/01/2021   [place] NEGATIVE 10/16/2021    CBC: Recent Labs  Lab 06/30/22 0614 07/01/22 0500 07/03/22 0356 07/04/22 0406 07/05/22 0452  WBC 9.2 6.5 5.7 5.0 5.6  HGB 8.7* 8.5* 7.7* 8.2* 8.4*  HCT 28.0* 27.6* 24.2* 26.3* 26.3*  MCV 90.0 90.2 90.6 89.5 88.9  PLT 138* 128* 115* 131* 155    Cardiac Enzymes: Recent Labs  Lab 07/01/22 0500  CKTOTAL 51    BNP (last 3 results) No results for input(s): "PROBNP" in the last 8760 hours. CBG: Recent Labs  Lab 07/05/22 1735 07/05/22 1950 07/05/22 2339 07/06/22 0353 07/06/22 0753  GLUCAP 137* 126* 88 84 109*    D-Dimer: No results for input(s): "DDIMER" in the last 72 hours. Hgb A1c: No results for input(s): "HGBA1C" in the last 72 hours. Lipid Profile: No results for input(s): "CHOL", "HDL", "LDLCALC", "TRIG", "CHOLHDL", "LDLDIRECT" in the last 72 hours. Thyroid function studies: No results for input(s): "TSH", "T4TOTAL", "T3FREE", "THYROIDAB" in the last 72 hours.  Invalid input(s): "FREET3" Anemia work up: No results for input(s): "VITAMINB12", "FOLATE", "FERRITIN", "TIBC", "IRON", "RETICCTPCT" in the last 72 hours. Sepsis Labs: Recent Labs  Lab 07/01/22 0500 07/03/22 0356 07/04/22 0406 07/05/22 0452  WBC 6.5 5.7 5.0 5.6    Microbiology Recent Results (from the past 240 hour(s))  Surgical pcr screen     Status: Abnormal   Collection Time: 06/28/22  2:19 PM   Specimen: Nasal Mucosa; Nasal Swab  Result Value Ref Range Status   MRSA, PCR NEGATIVE NEGATIVE Final   Staphylococcus aureus POSITIVE (A)  NEGATIVE Final    Comment: (NOTE) The Xpert SA Assay (FDA approved for NASAL specimens in patients 2 years of age and older), is one component of a comprehensive surveillance program. It is not intended to diagnose infection nor to guide or monitor treatment. Performed at [facility], [place] [address], [place], [place] [address]   Aerobic/Anaerobic Culture w Gram Stain (surgical/deep wound)     Status: None   Collection Time: 06/28/22  3:34 PM   Specimen: PATH Other; Tissue  Result Value Ref Range Status   Specimen Description WOUND  Final   Special Requests PARASPINAL MUSCLE TEST CLINDA  Final   Gram Stain NO WBC SEEN NO ORGANISMS SEEN   Final   Culture  Final    No growth aerobically or anaerobically. Performed at [facility], [place] [address], [place], [place] [address]    Report Status 07/03/2022 FINAL  Final  Aerobic/Anaerobic Culture w Gram Stain (surgical/deep wound)     Status: None   Collection Time: 06/28/22  4:12 PM   Specimen: PATH Other; Tissue  Result Value Ref Range Status   Specimen Description WOUND  Final   Special Requests THORACIC DISC SPACE  Final   Gram Stain NO WBC SEEN NO ORGANISMS SEEN   Final   Culture   Final    No growth aerobically or anaerobically. Performed at [facility], [place] [address], [place], [place] [address]    Report Status 07/03/2022 FINAL  Final  Aerobic/Anaerobic Culture w Gram Stain (surgical/deep wound)     Status: None   Collection Time: 06/28/22  4:15 PM   Specimen: PATH Other; Tissue  Result Value Ref Range Status   Specimen Description SITE NOT SPECIFIED  Final   Special Requests  SCREWS , CAPS , RODS  Final   Gram Stain   Final    RARE WBC PRESENT, PREDOMINANTLY MONONUCLEAR NO ORGANISMS SEEN    Culture   Final    No growth aerobically or anaerobically. Performed at [facility], [place] [address] [place], [place] [address]    Report Status 07/04/2022 FINAL  Final     Medications:    sodium  chloride   Intravenous Once   amLODipine  5 mg Oral Daily   carvedilol  12.5 mg Oral Daily   Chlorhexidine Gluconate Cloth  6 each Topical Daily   cholecalciferol  5,000 Units Oral Daily   darbepoetin (ARANESP) injection - NON-DIALYSIS  40 mcg Subcutaneous Q Mon-1800   DULoxetine  60 mg Oral Daily   enoxaparin (LOVENOX) injection  90 mg Subcutaneous Q24H   feeding supplement (GLUCERNA SHAKE)  237 mL Oral TID BM   folic acid  2 mg Oral Daily   insulin aspart  0-9 Units Subcutaneous Q4H   insulin glargine-yfgn  8 Units Subcutaneous QHS   mometasone-formoterol  2 puff Inhalation BID   montelukast  10 mg Oral QHS   multivitamin with minerals  1 tablet Oral Daily   mupirocin cream   Topical Daily   pregabalin  25 mg Oral TID   sodium bicarbonate  650 mg Oral BID   sodium chloride flush  3 mL Intravenous Q12H   tamsulosin  0.4 mg Oral Daily   Continuous Infusions:  sodium chloride     DAPTOmycin (CUBICIN) 500 mg in sodium chloride 0.9 % IVPB 500 mg (07/05/22 2250)      LOS: 16 days   Charlynne Cousins  [facility]  07/06/2022, 7:58 AM

## 2022-07-06 NOTE — Plan of Care (Signed)
  Problem: Education: Goal: Ability to describe self-care measures that may prevent or decrease complications (Diabetes Survival Skills Education) will improve Outcome: Progressing Goal: Individualized Educational Video(s) Outcome: Progressing   Problem: Fluid Volume: Goal: Ability to maintain a balanced intake and output will improve Outcome: Progressing   Problem: Health Behavior/Discharge Planning: Goal: Ability to identify and utilize available resources and services will improve Outcome: Progressing Goal: Ability to manage health-related needs will improve Outcome: Progressing   Problem: Metabolic: Goal: Ability to maintain appropriate glucose levels will improve Outcome: Progressing   Problem: Nutritional: Goal: Maintenance of adequate nutrition will improve Outcome: Progressing Goal: Progress toward achieving an optimal weight will improve Outcome: Progressing   Problem: Skin Integrity: Goal: Risk for impaired skin integrity will decrease Outcome: Progressing   Problem: Tissue Perfusion: Goal: Adequacy of tissue perfusion will improve Outcome: Progressing   Problem: Education: Goal: Knowledge of General Education information will improve Description: Including pain rating scale, medication(s)/side effects and non-pharmacologic comfort measures Outcome: Progressing   Problem: Health Behavior/Discharge Planning: Goal: Ability to manage health-related needs will improve Outcome: Progressing   Problem: Clinical Measurements: Goal: Ability to maintain clinical measurements within normal limits will improve Outcome: Progressing Goal: Will remain free from infection Outcome: Progressing Goal: Diagnostic test results will improve Outcome: Progressing Goal: Respiratory complications will improve Outcome: Progressing Goal: Cardiovascular complication will be avoided Outcome: Progressing   Problem: Activity: Goal: Risk for activity intolerance will  decrease Outcome: Progressing   Problem: Nutrition: Goal: Adequate nutrition will be maintained Outcome: Progressing   Problem: Coping: Goal: Level of anxiety will decrease Outcome: Progressing   Problem: Elimination: Goal: Will not experience complications related to bowel motility Outcome: Progressing Goal: Will not experience complications related to urinary retention Outcome: Progressing   Problem: Safety: Goal: Ability to remain free from injury will improve Outcome: Progressing   Problem: Skin Integrity: Goal: Risk for impaired skin integrity will decrease Outcome: Progressing

## 2022-07-07 ENCOUNTER — Other Ambulatory Visit: Payer: Self-pay | Admitting: Family Medicine

## 2022-07-07 ENCOUNTER — Encounter: Admit: 2022-07-07 | Discharge: 2022-07-07 | Payer: MEDICARE

## 2022-07-07 DIAGNOSIS — N179 Acute kidney failure, unspecified: Secondary | ICD-10-CM | POA: Diagnosis not present

## 2022-07-07 DIAGNOSIS — G894 Chronic pain syndrome: Secondary | ICD-10-CM

## 2022-07-07 DIAGNOSIS — M8610 Other acute osteomyelitis, unspecified site: Secondary | ICD-10-CM

## 2022-07-07 DIAGNOSIS — I82503 Chronic embolism and thrombosis of unspecified deep veins of lower extremity, bilateral: Secondary | ICD-10-CM | POA: Diagnosis not present

## 2022-07-07 DIAGNOSIS — M869 Osteomyelitis, unspecified: Secondary | ICD-10-CM | POA: Diagnosis not present

## 2022-07-07 DIAGNOSIS — Z981 Arthrodesis status: Secondary | ICD-10-CM

## 2022-07-07 DIAGNOSIS — I10 Essential (primary) hypertension: Secondary | ICD-10-CM | POA: Diagnosis not present

## 2022-07-07 LAB — CBC
HCT: 25.6 % — ABNORMAL LOW (ref 36.0–46.0)
Hemoglobin: 7.9 g/dL — ABNORMAL LOW (ref 12.0–15.0)
MCH: 27.9 pg (ref 26.0–34.0)
MCHC: 30.9 g/dL (ref 30.0–36.0)
MCV: 90.5 fL (ref 80.0–100.0)
Platelets: 131 10*3/uL — ABNORMAL LOW (ref 150–400)
RBC: 2.83 MIL/uL — ABNORMAL LOW (ref 3.87–5.11)
RDW: 16 % — ABNORMAL HIGH (ref 11.5–15.5)
WBC: 4.7 10*3/uL (ref 4.0–10.5)
nRBC: 0 % (ref 0.0–0.2)

## 2022-07-07 LAB — BASIC METABOLIC PANEL
Anion gap: 6 (ref 5–15)
BUN: 42 mg/dL — ABNORMAL HIGH (ref 8–23)
CO2: 26 mmol/L (ref 22–32)
Calcium: 8.7 mg/dL — ABNORMAL LOW (ref 8.9–10.3)
Chloride: 107 mmol/L (ref 98–111)
Creatinine, Ser: 4.03 mg/dL — ABNORMAL HIGH (ref 0.44–1.00)
GFR, Estimated: 12 mL/min — ABNORMAL LOW (ref >60)
Glucose, Bld: 72 mg/dL (ref 70–99)
Potassium: 4.5 mmol/L (ref 3.5–5.1)
Sodium: 139 mmol/L (ref 135–145)

## 2022-07-07 LAB — GLUCOSE, CAPILLARY
Glucose-Capillary: 70 mg/dL (ref 70–99)
Glucose-Capillary: 71 mg/dL (ref 70–99)
Glucose-Capillary: 73 mg/dL (ref 70–99)
Glucose-Capillary: 95 mg/dL (ref 70–99)
Glucose-Capillary: 106 mg/dL — ABNORMAL HIGH (ref 70–99)
Glucose-Capillary: 113 mg/dL — ABNORMAL HIGH (ref 70–99)

## 2022-07-07 MED ORDER — CLONIDINE HCL 0.1 MG PO TAB
ORAL_TABLET | 0 refills | Status: AC
Start: 2022-07-07 — End: ?

## 2022-07-07 MED ORDER — DARBEPOETIN ALFA 60 MCG/0.3ML IJ SOSY
60.0000 ug | PREFILLED_SYRINGE | Freq: Once | INTRAMUSCULAR | Status: AC
  Administered 2022-07-10: 60 ug via SUBCUTANEOUS
  Filled 2022-07-07: qty 0.3

## 2022-07-07 NOTE — Progress Notes (Signed)
Physical Therapy Treatment Patient Details Name: [name] [name] MRN: [id number] DOB: May 04, 1956 Today's Date: 07/07/2022   History of Present Illness Pt is a 66 y.o. female presenting 8/30 s/p posterior exploration/revision of posteriuor thoracic 11- Lumbar 3, extension of fusion T9-11 with vertebral augmentation. PMH significant for spinal surgery, chronic diastolic heart failure, CKD stage IV, renal transplant, chronic DVTs, DMII, and blindness.    PT Comments    Patient progressing to some short distance ambulation in the room.  Improved pain control with pre-med one hour prior to session and pt requesting to don brace in supine with also improved pain control.  She did drop 20 mmhg from supine to sitting, but improved once in chair.  She seems still set on home and noted [facility] being set up, though will need capable 24 hour assist and wheelchair for entry/exit to the home.  PT will continue to follow acutely.    Recommendations for follow up therapy are one component of a multi-disciplinary discharge planning process, led by the attending physician.  Recommendations may be updated based on patient status, additional functional criteria and insurance authorization.  Follow Up Recommendations  Home health PT Can patient physically be transported by private vehicle: Yes   Assistance Recommended at Discharge Frequent or constant Supervision/Assistance  Patient can return home with the following A lot of help with bathing/dressing/bathroom;Assistance with cooking/housework;Assist for transportation;Help with stairs or ramp for entrance;A lot of help with walking and/or transfers   Equipment Recommendations  Other (comment)    Recommendations for Other Services       Precautions / Restrictions Precautions Precautions: Fall;Back Required Braces or Orthoses: Spinal Brace Spinal Brace: Thoracolumbosacral orthotic;Applied in supine position (order for applying in sitting, but pt request in  supine)     Mobility  Bed Mobility Overal bed mobility: Needs Assistance Bed Mobility: Rolling, Sidelying to Sit Rolling: Min guard Sidelying to sit: Mod assist       General bed mobility comments: rolling for brace application in supine with A for hips, to sit up assist for lifting trunk    Transfers Overall transfer level: Needs assistance Equipment used: Rolling walker (2 wheels) Transfers: Sit to/from Stand Sit to Stand: Mod assist           General transfer comment: stood from EOB with some lifting help to RW    Ambulation/Gait Ambulation/Gait assistance: Mod assist, +2 safety/equipment Gait Distance (Feet): 6 Feet Assistive device: Rolling walker (2 wheels) Gait Pattern/deviations: Step-to pattern, Step-through pattern, Decreased stride length       General Gait Details: stepping with RW noted R foot on lateral aspect only with more heel weight bearing noted, pt reports feet "not good"  tech brought chair up behind her when ready to sit   Stairs             Wheelchair Mobility    Modified Rankin (Stroke Patients Only)       Balance Overall balance assessment: Needs assistance Sitting-balance support: Bilateral upper extremity supported, Single extremity supported Sitting balance-Leahy Scale: Poor                                      Cognition Arousal/Alertness: Awake/alert Behavior During Therapy: Anxious Overall Cognitive Status: No family/caregiver present to determine baseline cognitive functioning Area of Impairment: Attention, Following commands, Safety/judgement, Problem solving  Current Attention Level: Sustained   Following Commands: Follows one step commands consistently, Follows one step commands with increased time Safety/Judgement: Decreased awareness of safety   Problem Solving: Slow processing General Comments: hyperverbal        Exercises      General Comments General comments  (skin integrity, edema, etc.): BP supine 144/60, sitting EOB 124/50, once in chair 134/60      Pertinent Vitals/Pain Pain Assessment Faces Pain Scale: Hurts even more Pain Location: R side Pain Descriptors / Indicators: Aching, Guarding, Grimacing, Discomfort Pain Intervention(s): Monitored during session, Premedicated before session, Repositioned    Home Living                          Prior Function            PT Goals (current goals can now be found in the care plan section) Progress towards PT goals: Progressing toward goals    Frequency    Min 5X/week      PT Plan Current plan remains appropriate    Co-evaluation              AM-PAC PT "6 Clicks" Mobility   Outcome Measure  Help needed turning from your back to your side while in a flat bed without using bedrails?: A Lot Help needed moving from lying on your back to sitting on the side of a flat bed without using bedrails?: A Lot Help needed moving to and from a bed to a chair (including a wheelchair)?: A Lot Help needed standing up from a chair using your arms (e.g., wheelchair or bedside chair)?: A Lot Help needed to walk in hospital room?: Total Help needed climbing 3-5 steps with a railing? : Total 6 Click Score: 10    End of Session Equipment Utilized During Treatment: Gait belt;Back brace Activity Tolerance: Patient tolerated treatment well Patient left: in chair;with call bell/phone within reach;with chair alarm set   PT Visit Diagnosis: Other abnormalities of gait and mobility (R26.89);Difficulty in walking, not elsewhere classified (R26.2);Pain;Muscle weakness (generalized) (M62.81) Pain - Right/Left: Right Pain - part of body:  (flank)     Time: 8882-8003 PT Time Calculation (min) (ACUTE ONLY): 29 min  Charges:  $Gait Training: 8-22 mins $Therapeutic Activity: 8-22 mins                     Magda Kiel, PT Acute Rehabilitation  Services Office:[phone] 07/07/2022    Reginia Naas 07/07/2022, 4:13 PM

## 2022-07-07 NOTE — Telephone Encounter (Signed)
Requesting: NORCO Contract: 02/14/2022 UDS: 02/14/2022 Last OV: 05/25/2022 Next OV: N/A Last Refill: 06/02/2022, #[id number] RF Database:   Please advise

## 2022-07-07 NOTE — Progress Notes (Signed)
[facility] PROGRESS NOTE    Progress Note  [name] [name]  HDQ:[id number] DOB: 1956/05/02 DOA: 06/20/2022 PCP: Ann Held, DO     Brief Narrative:   [name] [name] is an 66 y.o. 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 Neurosurgery, ID and interventional radiology were consulted. Patient underwent IR aspiration on 06/23/2022, that grew staph epi on cultures.  Started on IV daptomycin.  Due to difficult access tunneled catheter was placed on 07/04/2022 Patient had worsening creatinine nephrology was reconsulted.   Assessment/Plan:   Recurrent acute kidney injury on chronic kidney disease stage IV: With a baseline creatinine of 2.6-3, increased to 5.8. Nephrology has been consulted and has been managing diuretics, and her creatinine is improving slowly. Patient has not had a renal transplant Physical therapy evaluated the patient, will need short-term rehab once her creatinine is improved.  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 Disc space aspiration culture grew staph epi found to be susceptible to daptomycin.  She will complete her treatment of IV daptomycin until 09/18/2022. Neurosurgery saw the patient underwent posterior arthrodesis of T8-3 11 and removal of T11 pedicle screws and removal of rods. IR consulted for central tunneled catheter given chronic kidney disease.   ID signed off on 9  04/03/2022 Antibiotics duration per ID.  Metabolic acidosis: Continue bicarbonate tablets.  Chronic DVT: At home was on Eliquis it was held on 06/18/2022 Underwent surgical intervention and drain has been removed. Neurosurgery recommended to restart Eliquis on 07/03/2022 due to her renal function had to transition to Lovenox. Currently on Lovenox until creatinine shows significant improvement.    Diabetes mellitus type 2: Fairly controlled continue current regimen.  Chronic diastolic heart failure: Appears euvolemic, Lasix was held due to worsening renal function.  Anxiety/depression: Continue Cymbalta and Valium.  Hyperkalemia: Now resolved.   DVT prophylaxis: Lovenox Family Communication:none Status is: Inpatient Remains inpatient appropriate because: Awaiting placement to CIR.    Code Status:     Code Status Orders  (From admission, onward)           Start     Ordered   06/21/22 0021  Full code  Continuous        06/21/22 0025           Code Status History     Date Active Date Inactive Code Status Order ID Comments User Context   01/07/2022 0511 01/09/2022 2045 Full Code [id number]  Rhetta Mura DO Inpatient   10/17/2021 0420 10/22/2021 1928 Full Code [id number]  Chotiner, Yevonne Aline, MD ED   02/18/2020 2241 02/26/2020 2334 Full Code [id number]  Dwyane Dee, MD Inpatient   01/29/2020 1704 02/18/2020 2044 Full Code [id number]  Cathlyn Parsons, PA-C Inpatient   01/29/2020 1704 01/29/2020 1704 Full Code [id number]  Cathlyn Parsons, PA-C Inpatient   01/22/2020 2141 01/29/2020 1650 Full Code [id number]  Vallarie Mare, MD Inpatient   01/21/2020 0012 01/22/2020 2141 Full Code [id number]  Rise Patience, MD Inpatient   11/15/2019 2200  11/18/2019 2226 Full Code [id number]  Jani Gravel, MD ED   04/15/2019 2122 04/21/2019 2320 Full Code [id number]  Elwyn Reach, MD Inpatient   04/06/2017 2004 04/11/2017 2109 Full Code [id number]  Edwin Dada, MD Inpatient    09/29/2016 1831 10/02/2016 1643 Full Code [id number]  [name], MD Inpatient   03/19/2013 1507 03/21/2013 1812 Full Code [id number]  [name], MD Inpatient   02/10/2013 2124 02/20/2013 1859 Full Code [id number]  [name], MD Inpatient   02/03/2013 2137 02/10/2013 2124 Full Code [id number]  Bynum Bellows, MD Inpatient   01/11/2012 0612 01/13/2012 1645 Full Code [id number]  Herb Grays, RN Inpatient         IV Access:   Peripheral IV   Procedures and diagnostic studies:   No results found.   Medical Consultants:   None.   Subjective:    [name] [name] relates she continues to have pain every time she works with physical therapy.  Objective:    Vitals:   07/06/22 2348 07/07/22 0400 07/07/22 0500 07/07/22 0736  BP: 135/67 (!) 158/62  (!) 154/65  Pulse: 60 60  60  Resp: 15 16  16   Temp: (!) 97.5 F (36.4 C) 97.8 F (36.6 C)  (!) 97.5 F (36.4 C)  TempSrc: Oral Axillary  Oral  SpO2: 93% 98%  94%  Weight:   87.7 kg   Height:       SpO2: 94 % O2 Flow Rate (L/min): 2 L/min   Intake/Output Summary (Last 24 hours) at 07/07/2022 0945 Last data filed at 07/07/2022 0420 Gross per 24 hour  Intake 280 ml  Output 1340 ml  Net -1060 ml    Filed Weights   07/05/22 0500 07/06/22 0500 07/07/22 0500  Weight: 89 kg 89.5 kg 87.7 kg    Exam: General exam: In no acute distress. Respiratory system: Good air movement and clear to auscultation. Cardiovascular system: S1 & S2 heard, RRR. No JVD. Gastrointestinal system: Abdomen is nondistended, soft and nontender.  Extremities: No pedal edema. Skin: No rashes, lesions or ulcers  Data Reviewed:    Labs: Basic Metabolic Panel: Recent Labs  Lab 07/03/22 0356 07/04/22 0406 07/05/22 0452 07/06/22 1206 07/07/22 0400  NA 138 139 140 139 139  K 4.7 4.8 4.5 4.7 4.5  CL 110 111 109 109 107  CO2 22 22 22 25 26   GLUCOSE 79 93 94 127* 72  BUN 60* 54* 46* 43* 42*  CREATININE 5.36* 5.23* 4.46* 4.15* 4.03*   CALCIUM 8.4* 8.6* 8.8* 8.6* 8.7*    GFR Estimated Creatinine Clearance (by C-G formula based on SCr of 4.03 mg/dL (H)) Female: 13.2 mL/min (A) Female: 16.2 mL/min (A) Liver Function Tests: Recent Labs  Lab 07/01/22 0500 07/04/22 0406  AST 13* 15  ALT 11 13  ALKPHOS 78 94  BILITOT 0.6 0.3  PROT 6.0* 6.5  ALBUMIN 2.1* 2.2*    No results for input(s): "LIPASE", "AMYLASE" in the last 168 hours. No results for input(s): "AMMONIA" in the last 168 hours. Coagulation profile No results for input(s): "INR", "PROTIME" in the last 168 hours. COVID-19 Labs  No results for input(s): "DDIMER", "FERRITIN", "LDH", "CRP" in the last 72 hours.  Lab Results  Component Value Date   SARSCOV2NAA NEGATIVE 03/25/2022   SARSCOV2NAA NEGATIVE 01/07/2022   SARSCOV2NAA POSITIVE (A) 12/01/2021   [place] NEGATIVE 10/16/2021    CBC: Recent Labs  Lab 07/01/22 0500 07/03/22 0356 07/04/22 0406 07/05/22 0452 07/07/22 0400  WBC 6.5 5.7 5.0 5.6 4.7  HGB 8.5* 7.7* 8.2* 8.4* 7.9*  HCT 27.6* 24.2* 26.3* 26.3* 25.6*  MCV 90.2 90.6 89.5 88.9 90.5  PLT 128* 115* 131* 155 131*    Cardiac Enzymes: Recent Labs  Lab 07/01/22 0500  CKTOTAL 51    BNP (last 3 results) No results for input(s): "PROBNP" in the last 8760 hours. CBG: Recent Labs  Lab 07/06/22 2011 07/06/22 2350 07/07/22 0407 07/07/22 0426 07/07/22 0737  GLUCAP 103* 82 71 70 73    D-Dimer: No results for input(s): "DDIMER" in the last 72 hours. Hgb A1c: No results for input(s): "HGBA1C" in the last 72 hours. Lipid Profile: No results for input(s): "CHOL", "HDL", "LDLCALC", "TRIG", "CHOLHDL", "LDLDIRECT" in the last 72 hours. Thyroid function studies: No results for input(s): "TSH", "T4TOTAL", "T3FREE", "THYROIDAB" in the last 72 hours.  Invalid input(s): "FREET3" Anemia work up: No results for input(s): "VITAMINB12", "FOLATE", "FERRITIN", "TIBC", "IRON", "RETICCTPCT" in the last 72 hours. Sepsis Labs: Recent Labs   Lab 07/03/22 0356 07/04/22 0406 07/05/22 0452 07/07/22 0400  WBC 5.7 5.0 5.6 4.7    Microbiology Recent Results (from the past 240 hour(s))  Surgical pcr screen     Status: Abnormal   Collection Time: 06/28/22  2:19 PM   Specimen: Nasal Mucosa; Nasal Swab  Result Value Ref Range Status   MRSA, PCR NEGATIVE NEGATIVE Final   Staphylococcus aureus POSITIVE (A) NEGATIVE Final    Comment: (NOTE) The Xpert SA Assay (FDA approved for NASAL specimens in patients 61 years of age and older), is one component of a comprehensive surveillance program. It is not intended to diagnose infection nor to guide or monitor treatment. Performed at [facility], [place] [address] [place], [place] [address]   Aerobic/Anaerobic Culture w Gram Stain (surgical/deep wound)     Status: None   Collection Time: 06/28/22  3:34 PM   Specimen: PATH Other; Tissue  Result Value Ref Range Status   Specimen Description WOUND  Final   Special Requests PARASPINAL MUSCLE TEST CLINDA  Final   Gram Stain NO WBC SEEN NO ORGANISMS SEEN   Final   Culture   Final    No growth aerobically or anaerobically. Performed at [facility], [place] [address] [place], [place] [address]    Report Status 07/03/2022 FINAL  Final  Aerobic/Anaerobic Culture w Gram Stain (surgical/deep wound)     Status: None   Collection Time: 06/28/22  4:12 PM   Specimen: PATH Other; Tissue  Result Value Ref Range Status   Specimen Description WOUND  Final   Special Requests THORACIC DISC SPACE  Final   Gram Stain NO WBC SEEN NO ORGANISMS SEEN   Final   Culture   Final    No growth aerobically or anaerobically. Performed at [facility], [place] [name] [place], [place] [address]    Report Status 07/03/2022 FINAL  Final  Aerobic/Anaerobic Culture w Gram Stain (surgical/deep wound)     Status: None   Collection Time: 06/28/22  4:15 PM   Specimen: PATH Other; Tissue  Result Value Ref Range Status   Specimen Description SITE  NOT SPECIFIED  Final   Special Requests  SCREWS , CAPS , RODS  Final   Gram Stain   Final    RARE WBC PRESENT, PREDOMINANTLY MONONUCLEAR NO ORGANISMS SEEN    Culture   Final    No growth aerobically or anaerobically. Performed at [facility], [place]  [address], [place], [place] [address]    Report Status 07/04/2022 FINAL  Final     Medications:    sodium chloride   Intravenous Once   amLODipine  5 mg Oral Daily   carvedilol  12.5 mg Oral Daily   Chlorhexidine Gluconate Cloth  6 each Topical Daily   cholecalciferol  5,000 Units Oral Daily   [START ON 07/10/2022] darbepoetin (ARANESP) injection - NON-DIALYSIS  60 mcg Subcutaneous Q Mon-1800   DULoxetine  60 mg Oral Daily   enoxaparin (LOVENOX) injection  90 mg Subcutaneous Q24H   feeding supplement (GLUCERNA SHAKE)  237 mL Oral TID BM   folic acid  2 mg Oral Daily   furosemide  20 mg Oral Daily   insulin aspart  0-9 Units Subcutaneous Q4H   insulin glargine-yfgn  8 Units Subcutaneous QHS   mometasone-formoterol  2 puff Inhalation BID   montelukast  10 mg Oral QHS   multivitamin with minerals  1 tablet Oral Daily   mupirocin cream   Topical Daily   pregabalin  25 mg Oral TID   sodium bicarbonate  650 mg Oral BID   sodium chloride flush  3 mL Intravenous Q12H   tamsulosin  0.4 mg Oral Daily   Continuous Infusions:  sodium chloride     DAPTOmycin (CUBICIN) 500 mg in sodium chloride 0.9 % IVPB Stopped (07/05/22 2320)      LOS: 17 days   Charlynne Cousins  [facility]  07/07/2022, 9:45 AM

## 2022-07-07 NOTE — Plan of Care (Signed)
  Problem: Education: Goal: Ability to describe self-care measures that may prevent or decrease complications (Diabetes Survival Skills Education) will improve Outcome: Progressing   Problem: Skin Integrity: Goal: Risk for impaired skin integrity will decrease Outcome: Progressing   Problem: Activity: Goal: Risk for activity intolerance will decrease Outcome: Progressing   Problem: Pain Managment: Goal: General experience of comfort will improve Outcome: Progressing

## 2022-07-07 NOTE — Telephone Encounter (Signed)
Medication: HYDROcodone-acetaminophen (NORCO/VICODIN) 5-325   Preferred Pharmacy (with phone number or street name): [address], [place] [address]  Phone:  [phone]  Fax:  [phone]   Agent: Please be advised that RX refills may take up to 3 business days. We ask that you follow-up with your pharmacy.

## 2022-07-07 NOTE — Progress Notes (Signed)
[facility] Progress Note  Name: [name] [name] MRN: [id number] DOB: 04-17-1956   Subjective:  She had 2.3 liters UOP over 9/7 charted.  Baseline creatinine recently has been around 4 and was up to 5.36 at time of re-consult.  She's a bit nervous because she had her blood sugar drop overnight and couldn't reach a snack.  She's got her graham crackers back on her table.   Review of systems:     Visually impaired Denies shortness of breath Nausea this am - better now Diarrhea resolved    Intake/Output Summary (Last 24 hours) at 07/07/2022 1356 Last data filed at 07/07/2022 0420 Gross per 24 hour  Intake 120 ml  Output 1340 ml  Net -1220 ml    Vitals:  Vitals:   07/07/22 0400 07/07/22 0500 07/07/22 0736 07/07/22 1143  BP: (!) 158/62  (!) 154/65 (!) 138/57  Pulse: 60  60 65  Resp: 16  16 16   Temp: 97.8 F (36.6 C)  (!) 97.5 F (36.4 C) (!) 97.5 F (36.4 C)  TempSrc: Axillary  Oral Oral  SpO2: 98%  94% 91%  Weight:  87.7 kg    Height:         Physical Exam:     General adult female in bed in no acute distress HEENT normocephalic atraumatic extraocular movements intact sclera anicteric Neck supple trachea midline Lungs clear to auscultation bilaterally normal work of breathing at rest; on room air Heart S1S2 no rub Abdomen soft nontender nondistended Extremities trace edema Psych normal mood and affect Neuro visually impaired; awake and alert provides basic hx Gu foley in place with pale yellow urine  Medications reviewed   Labs:     Latest Ref Rng & Units 07/07/2022    4:00 AM 07/06/2022   12:06 PM 07/05/2022    4:52 AM  BMP  Glucose 70 - 99 mg/dL 72  127  94   BUN 8 - 23 mg/dL 42  43  46   Creatinine 0.44 - 1.00 mg/dL 4.03  4.15  4.46   Sodium 135 - 145 mmol/L 139  139  140   Potassium 3.5 - 5.1 mmol/L 4.5  4.7  4.5   Chloride 98 - 111 mmol/L 107  109  109   CO2 22 - 32 mmol/L 26  25  22    Calcium 8.9 - 10.3 mg/dL 8.7  8.6  8.8       Assessment/Plan:   AKI   Now with recurrent AKI  Earlier with AKI in setting of urinary retention, n/v w/ new vertebral discitis/ poss osteomyelitis and poss UTI. UA showed ^wbc's. CT w/o hydronephrosis. Suspected AKI due to vol depletion + urinary retention.  Continue foley catheter  Creatinine has plateaued thankfully and urine output improved.   Lasix 20 mg PO daily to start today.  (Note that lasix 40 mg daily is listed her home dose but she normally just takes 20 mg daily for reference.)   Would please transition to an alternative to cymbalta given her advanced CKD  CKD stage IV - baseline creat is 2.6- 3.0 from march 2023, eGFR 16-20 ml/min. More recently Cr closer to 4.  She is followed by Dr Carolin Sicks at [facility]. 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 abx per pmd Anemia CKD - defer Iv iron in setting of infection. Gave aranesp 40 mcg on 9/4 and increased dose  to aranesp 60 mcg for 9/11 dose.  Then trend labs.  Not previously on an ESA.  Please note that she does not have a renal transplant.  pt states she was adopted and that everybody has always told her that when she was a kid she had a kidney transplant. She is not on any transplant meds, there is no physical scar on the abdomen and no transplant kidney apparent on any of her abdominal CT scans. This has been discussed with the patient by multiple providers including at least the previous nephrologist on service this admission, me personally in 01/2020, me personally on re-consult, and Dr. Florene Glen more remotely prior to her transition to Dr. Carolin Sicks upon Dr. Abel Presto retirement.    Disposition - per charting she is awaiting placement to CIR.  She is back at her recent baseline Creatinine of 4.  Nephrology will sign off.  Please do not hesitate to contact us with any questions.   [name], MD 07/07/2022 2:11 PM

## 2022-07-07 NOTE — TOC Progression Note (Signed)
Transition of Care [facility]) - Progression Note    Patient Details  Name: Afifa Truax MRN: [id number] Date of Birth: 1956/02/04  Transition of Care [facility] CM/SW Contact  Pollie Friar, RN Phone Number: 07/07/2022, 11:33 AM  Clinical Narrative:    Pt will d/c home with home health services when medically ready. CM has confirmed this with patients spouse. [facility] and [employer] arranged for [facility] services and IV abx. Pam to reach out to spouse about refresher teaching on abx administration. Pt has all needed DME at home.  TOC following.    Expected Discharge Plan: [place] Barriers to Discharge: Continued Medical Work up  Expected Discharge Plan and Services Expected Discharge Plan: [place]   Discharge Planning Services: CM Consult Post Acute Care Choice: [place] arrangements for the past 2 months: Apartment                           HH Arranged: RN, PT, OT, Nurse's Aide, Social Work [employer] Agency: [place] Date [place]: 06/29/22 Time [place]: 1630 Representative spoke with at [place]: [place]) Interventions    Readmission Risk Interventions    06/23/2022    2:13 PM 01/23/2020    2:59 PM 11/18/2019   10:54 AM  Readmission Risk Prevention Plan  Transportation Screening Complete Complete Complete  PCP or Specialist Appt within 3-5 Days Complete    HRI or [place] Complete    Social Work Consult for [place] Planning/Counseling [place] Not Applicable    Medication Review Press photographer) Complete Complete Referral to Pharmacy  PCP or Specialist appointment within 3-5 days of discharge  Complete Complete  HRI or [place]  Complete Complete  SW Recovery Care/Counseling Consult  Complete Complete  Palliative Care Screening  Complete Not [place]  Complete Not Applicable

## 2022-07-07 NOTE — Telephone Encounter
Rec'd refill request for clonidine. Per Dr. Kirby Funk note from 9/5, pt to go back to 0.'2mg'$  every AM and 0.'1mg'$  every PM. Script sent per protocol.

## 2022-07-07 NOTE — Plan of Care (Signed)

## 2022-07-08 DIAGNOSIS — N179 Acute kidney failure, unspecified: Secondary | ICD-10-CM | POA: Diagnosis not present

## 2022-07-08 DIAGNOSIS — M869 Osteomyelitis, unspecified: Secondary | ICD-10-CM | POA: Diagnosis not present

## 2022-07-08 DIAGNOSIS — I82503 Chronic embolism and thrombosis of unspecified deep veins of lower extremity, bilateral: Secondary | ICD-10-CM | POA: Diagnosis not present

## 2022-07-08 DIAGNOSIS — I10 Essential (primary) hypertension: Secondary | ICD-10-CM | POA: Diagnosis not present

## 2022-07-08 LAB — BASIC METABOLIC PANEL
Anion gap: 7 (ref 5–15)
BUN: 38 mg/dL — ABNORMAL HIGH (ref 8–23)
CO2: 26 mmol/L (ref 22–32)
Calcium: 8.8 mg/dL — ABNORMAL LOW (ref 8.9–10.3)
Chloride: 105 mmol/L (ref 98–111)
Creatinine, Ser: 4.13 mg/dL — ABNORMAL HIGH (ref 0.44–1.00)
GFR, Estimated: 11 mL/min — ABNORMAL LOW (ref >60)
Glucose, Bld: 73 mg/dL (ref 70–99)
Potassium: 4.3 mmol/L (ref 3.5–5.1)
Sodium: 138 mmol/L (ref 135–145)

## 2022-07-08 LAB — GLUCOSE, CAPILLARY
Glucose-Capillary: 103 mg/dL — ABNORMAL HIGH (ref 70–99)
Glucose-Capillary: 119 mg/dL — ABNORMAL HIGH (ref 70–99)
Glucose-Capillary: 70 mg/dL (ref 70–99)
Glucose-Capillary: 74 mg/dL (ref 70–99)
Glucose-Capillary: 74 mg/dL (ref 70–99)
Glucose-Capillary: 87 mg/dL (ref 70–99)
Glucose-Capillary: 91 mg/dL (ref 70–99)

## 2022-07-08 LAB — CK: Total CK: 29 U/L — ABNORMAL LOW (ref 38–234)

## 2022-07-08 NOTE — Plan of Care (Signed)

## 2022-07-08 NOTE — Progress Notes (Signed)
Physical Therapy Treatment Patient Details Name: [name] [name] MRN: [id number] DOB: [birth year] Today's Date: 07/08/2022   History of Present Illness Pt is a 66 y.o. female presenting 8/30 s/p posterior exploration/revision of posteriuor thoracic 11- Lumbar 3, extension of fusion T9-11 with vertebral augmentation. PMH significant for spinal surgery, chronic diastolic heart failure, CKD stage IV, renal transplant, chronic DVTs, DMII, and blindness.    PT Comments    Pt supine in bed on arrival this session.  Pt continues to benefit from rehab in home setting.  Pt prefers brace application in supine despite orders for sitting.  Her pain is better managed with this method.  Progressing slowly but remains motivated.      Recommendations for follow up therapy are one component of a multi-disciplinary discharge planning process, led by the attending physician.  Recommendations may be updated based on patient status, additional functional criteria and insurance authorization.  Follow Up Recommendations  Home health PT     Assistance Recommended at Discharge Frequent or constant Supervision/Assistance  Patient can return home with the following A lot of help with bathing/dressing/bathroom;Assistance with cooking/housework;Assist for transportation;Help with stairs or ramp for entrance;A lot of help with walking and/or transfers   Equipment Recommendations  Other (comment)    Recommendations for Other Services       Precautions / Restrictions Precautions Precautions: Fall;Back Precaution Booklet Issued: No Precaution Comments: Reviewed precautions during functional mobility. Pt with low vision. Required Braces or Orthoses: Spinal Brace Spinal Brace: Thoracolumbosacral orthotic;Applied in supine position (order for sitting but patient prefers supine) Restrictions Weight Bearing Restrictions: No Other Position/Activity Restrictions: no bending, lifting, twisting     Mobility  Bed  Mobility Overal bed mobility: Needs Assistance Bed Mobility: Rolling, Sidelying to Sit Rolling: Min guard Sidelying to sit: Min assist       General bed mobility comments: rolling for brace application in supine with A for hips, to sit up assist for lifting trunk patient able to assist more today.    Transfers Overall transfer level: Needs assistance Equipment used: Rolling walker (2 wheels) (youth height) Transfers: Sit to/from Stand Sit to Stand: Mod assist           General transfer comment: stood from EOB with some lifting help to RW.  cues for hand placement.    Ambulation/Gait Ambulation/Gait assistance: Mod assist, +2 physical assistance Gait Distance (Feet): 8 Feet Assistive device: Rolling walker (2 wheels) Gait Pattern/deviations: Step-to pattern, Step-through pattern, Decreased stride length, Leaning posteriorly       General Gait Details: stepping with RW noted R foot on lateral aspect only with more heel weight bearing noted.  Close chair follow and assistance to correct balance.  Fatigues quickly.   Stairs             Wheelchair Mobility    Modified Rankin (Stroke Patients Only)       Balance Overall balance assessment: Needs assistance Sitting-balance support: Bilateral upper extremity supported, Single extremity supported Sitting balance-Leahy Scale: Poor Sitting balance - Comments: initially reliant on UE support and when resting arm for BP measurement provided mod A for back support Postural control: Posterior lean   Standing balance-Leahy Scale: Poor                              Cognition Arousal/Alertness: Awake/alert Behavior During Therapy: Anxious, Agitated (but then pleasant as tx progressed and she was more comfortable.) Overall Cognitive Status: No family/caregiver present  to determine baseline cognitive functioning Area of Impairment: Attention, Following commands, Safety/judgement, Problem solving                    Current Attention Level: Sustained   Following Commands: Follows one step commands consistently, Follows one step commands with increased time Safety/Judgement: Decreased awareness of safety   Problem Solving: Slow processing General Comments: hyperverbal        Exercises      General Comments        Pertinent Vitals/Pain Pain Assessment Pain Assessment: Faces Faces Pain Scale: Hurts little more Pain Descriptors / Indicators: Aching, Guarding, Grimacing, Discomfort Pain Intervention(s): Monitored during session, Repositioned    Home Living                          Prior Function            PT Goals (current goals can now be found in the care plan section) Acute Rehab PT Goals Patient Stated Goal: Be able to go home Potential to Achieve Goals: Fair Progress towards PT goals: Progressing toward goals    Frequency    Min 5X/week      PT Plan Current plan remains appropriate    Co-evaluation              AM-PAC PT "6 Clicks" Mobility   Outcome Measure  Help needed turning from your back to your side while in a flat bed without using bedrails?: A Little Help needed moving from lying on your back to sitting on the side of a flat bed without using bedrails?: A Little Help needed moving to and from a bed to a chair (including a wheelchair)?: A Lot Help needed standing up from a chair using your arms (e.g., wheelchair or bedside chair)?: A Lot Help needed to walk in hospital room?: A Lot Help needed climbing 3-5 steps with a railing? : Total 6 Click Score: 13    End of Session Equipment Utilized During Treatment: Gait belt;Back brace Activity Tolerance: Patient tolerated treatment well Patient left: in chair;with call bell/phone within reach Nurse Communication: Mobility status PT Visit Diagnosis: Other abnormalities of gait and mobility (R26.89);Difficulty in walking, not elsewhere classified (R26.2);Pain;Muscle weakness  (generalized) (M62.81) Pain - Right/Left: Right Pain - part of body:  (flank)     Time: 2355-7322 PT Time Calculation (min) (ACUTE ONLY): 29 min  Charges:  $Gait Training: 8-22 mins $Therapeutic Activity: 8-22 mins                     Erasmo Leventhal , PTA Acute Rehabilitation Services Office [phone]    Vaniah Chambers Eli Hose 07/08/2022, 1:18 PM

## 2022-07-08 NOTE — Progress Notes (Signed)
PT Cancellation Note  Patient Details Name: [name] [name] MRN: [id number] DOB: Apr 21, 1956   Cancelled Treatment:    Reason Eval/Treat Not Completed: (P) Other (comment) (lunch arrived and reports to wait until after 1235 to see her so her meds can kick in, will f/u.)   [name] [name] [name] [name] 07/08/2022, 12:20 PM  Erasmo Leventhal , PTA [place] Office [phone]

## 2022-07-08 NOTE — Progress Notes (Signed)
PHARMACY ANTIBIOTIC CONSULT NOTE   Menaal Edwin Cherian a 66 y.o. adult admitted with disicitis/osteomyelitis   9/9 CK 29 (WNL)  Estimated Creatinine Clearance (by C-G formula based on SCr of 4.13 mg/dL (H)) Female: 13 mL/min (A) Female: 15.9 mL/min (A)  Plan: OPAT entered for daptomycin 500 mg IV Q48H   Allergies:  Allergies  Allergen Reactions   Bee Pollen Anaphylaxis   Broccoli [Brassica Oleracea] Anaphylaxis and Hives   Fish-Derived Products Anaphylaxis and Hives   Mushroom Extract Complex Anaphylaxis   Penicillins Anaphylaxis    **Tolerated cefepime March 2021 Did it involve swelling of the face/tongue/throat, SOB, or low BP? Yes Did it involve sudden or severe rash/hives, skin peeling, or any reaction on the inside of your mouth or nose? No Did you need to seek medical attention at a hospital or doctor's office? Yes When did it last happen?  A few months ago If all above answers are "NO", may proceed with cephalosporin use.   Rosemary Oil Anaphylaxis   Shellfish Allergy Anaphylaxis and Hives   Tomato Anaphylaxis and Hives    Raw tomatoes only, can tolerate if cooked   Acetaminophen Other (See Comments)    GI upset   Aloe Vera Hives   Acyclovir And Related Other (See Comments)    Unknown reaction   Naproxen Hives    Filed Weights   07/06/22 0500 07/07/22 0500 07/08/22 0457  Weight: 89.5 kg (197 lb 5 oz) 87.7 kg (193 lb 5.5 oz) 88.4 kg (194 lb 14.2 oz)       Latest Ref Rng & Units 07/07/2022    4:00 AM 07/05/2022    4:52 AM 07/04/2022    4:06 AM  CBC  WBC 4.0 - 10.5 K/uL 4.7  5.6  5.0   Hemoglobin 12.0 - 15.0 g/dL 7.9  8.4  8.2   Hematocrit 36.0 - 46.0 % 25.6  26.3  26.3   Platelets 150 - 400 K/uL 131  155  131     Antibiotics Given (last 72 hours)     Date/Time Action Medication Dose Rate   07/05/22 2250 New Bag/Given   DAPTOmycin (CUBICIN) 500 mg in sodium chloride 0.9 % IVPB 500 mg 120 mL/hr   07/07/22 2043 New Bag/Given   DAPTOmycin (CUBICIN) 500 mg in  sodium chloride 0.9 % IVPB 500 mg 120 mL/hr       Antimicrobials this admission: 8/22 CTX >>8/28  8/23 vanc >> 8/25 8/25 dapto 63m/kg q48h >>(07/30/22)  Microbiology results: 8/22 UCx: >100k K pneumo (R-amp) 8/25 wound culture: few MRSE (S-tet, R-bactrim) ID notes sens to daptomycin 8/30 MRSA PCR: negative  8/30 surgical /deep wound Cx: negative   Thank you for allowing pharmacy to be a part of this patient's care.  AAdria Dill PharmD PGY-2 Infectious Diseases Resident  07/08/2022 3:14 PM

## 2022-07-08 NOTE — Progress Notes (Signed)
[facility] PROGRESS NOTE    Progress Note  Mendi Constable  MAY:[id number] DOB: 1956/09/14 DOA: 06/20/2022 PCP: Ann Held, DO     Brief Narrative:   [name] [name] is an 66 y.o. 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 Neurosurgery, ID and interventional radiology were consulted. Patient underwent IR aspiration on 06/23/2022, that grew staph epi on cultures.  Started on IV daptomycin.  Due to difficult access tunneled catheter was placed on 07/04/2022 Patient had worsening creatinine nephrology was reconsulted.   Assessment/Plan:   Recurrent acute kidney injury on chronic kidney disease stage IV: With a baseline creatinine of 2.6-3, peaked at 5.8. Nephrology has been consulted and has been managing diuretics, creatinine has plateaued. She had good urine output. Patient has not had a renal transplant Physical therapy evaluated the patient, she will go home with home health PT.  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 Disc space aspiration culture grew staph epi found to be susceptible to daptomycin.  She will complete her treatment of IV daptomycin until 09/18/2022. Neurosurgery saw the patient underwent posterior arthrodesis of T8-3 11 and removal of T11 pedicle screws and removal of rods. IR consulted for central tunneled catheter given chronic kidney disease.   ID signed off on 9 04/03/2022 Antibiotics  duration per ID.  Metabolic acidosis: Continue bicarbonate tablets.  Chronic DVT: At home was on Eliquis it was held on 06/18/2022 Underwent surgical intervention and drain has been removed. Neurosurgery recommended to restart Eliquis on 07/03/2022 due to her renal function had to transition to Lovenox. Currently on Lovenox until creatinine shows significant improvement.    Diabetes mellitus type 2: Fairly controlled continue current regimen.  Chronic diastolic heart failure: Appears euvolemic, Lasix was held due to worsening renal function.  Anxiety/depression: Continue Cymbalta and Valium.  Hyperkalemia: Now resolved.   DVT prophylaxis: Lovenox Family Communication:none Status is: Inpatient Remains inpatient appropriate because: Awaiting placement to CIR.    Code Status:     Code Status Orders  (From admission, onward)           Start     Ordered   06/21/22 0021  Full code  Continuous        06/21/22 0025           Code Status History     Date Active Date Inactive Code Status Order ID Comments User Context   01/07/2022 0511 01/09/2022 2045 Full Code [id number]  Rhetta Mura DO Inpatient   10/17/2021 0420 10/22/2021 1928 Full Code [id number]  Chotiner, Yevonne Aline, MD ED   02/18/2020 2241 02/26/2020 2334 Full Code [id number]  Dwyane Dee, MD Inpatient   01/29/2020 1704 02/18/2020 2044 Full Code [id number]  Cathlyn Parsons, PA-C Inpatient   01/29/2020 1704 01/29/2020 1704 Full Code [id number]  Cathlyn Parsons, PA-C Inpatient   01/22/2020 2141 01/29/2020 1650 Full Code [id number]  Vallarie Mare, MD Inpatient   01/21/2020 0012 01/22/2020 2141 Full Code [id number]  Rise Patience, MD Inpatient   11/15/2019  [place] 11/18/2019 2226 Full Code [id number]  Jani Gravel, MD ED   04/15/2019 2122 04/21/2019 2320 Full Code [id number]  Elwyn Reach, MD Inpatient   04/06/2017 2004 04/11/2017 2109 Full Code [id number]  Edwin Dada, MD Inpatient   09/29/2016 1831  10/02/2016 1643 Full Code [id number]  [name], MD Inpatient   03/19/2013 1507 03/21/2013 1812 Full Code [id number]  [name], MD Inpatient   02/10/2013 2124 02/20/2013 1859 Full Code [id number]  [name], MD Inpatient   02/03/2013 2137 02/10/2013 2124 Full Code [id number]  Bynum Bellows, MD Inpatient   01/11/2012 0612 01/13/2012 1645 Full Code [id number]  Herb Grays, RN Inpatient         IV Access:   Peripheral IV   Procedures and diagnostic studies:   No results found.   Medical Consultants:   None.   Subjective:    [name] no complaints this morning.  Objective:    Vitals:   07/08/22 0000 07/08/22 0400 07/08/22 0457 07/08/22 0815  BP: (!) 140/49 (!) 167/93  (!) 157/46  Pulse: 62 61  66  Resp: 14 16    Temp: (!) 97.4 F (36.3 C) 97.8 F (36.6 C)  97.7 F (36.5 C)  TempSrc: Axillary Axillary  Oral  SpO2: 95% 97%  98%  Weight:   88.4 kg   Height:       SpO2: 98 % O2 Flow Rate (L/min): 2 L/min   Intake/Output Summary (Last 24 hours) at 07/08/2022 0823 Last data filed at 07/08/2022 0400 Gross per 24 hour  Intake 120 ml  Output 730 ml  Net -610 ml    Filed Weights   07/06/22 0500 07/07/22 0500 07/08/22 0457  Weight: 89.5 kg 87.7 kg 88.4 kg    Exam: General exam: In no acute distress. Respiratory system: Good air movement and clear to auscultation. Cardiovascular system: S1 & S2 heard, RRR. No JVD. Gastrointestinal system: Abdomen is nondistended, soft and nontender.  Extremities: No pedal edema. Skin: No rashes, lesions or ulcers Psychiatry: Judgement and insight appear normal. Mood & affect appropriate.  Data Reviewed:    Labs: Basic Metabolic Panel: Recent Labs  Lab 07/04/22 0406 07/05/22 0452 07/06/22 1206 07/07/22 0400 07/08/22 0400  NA 139 140 139 139 138  K 4.8 4.5 4.7 4.5 4.3  CL 111 109 109 107 105  CO2 22 22 25 26 26   GLUCOSE 93 94 127* 72 73  BUN 54* 46* 43* 42* 38*  CREATININE 5.23* 4.46* 4.15*  4.03* 4.13*  CALCIUM 8.6* 8.8* 8.6* 8.7* 8.8*    GFR Estimated Creatinine Clearance (by C-G formula based on SCr of 4.13 mg/dL (H)) Female: 13 mL/min (A) Female: 15.9 mL/min (A) Liver Function Tests: Recent Labs  Lab 07/04/22 0406  AST 15  ALT 13  ALKPHOS 94  BILITOT 0.3  PROT 6.5  ALBUMIN 2.2*    No results for input(s): "LIPASE", "AMYLASE" in the last 168 hours. No results for input(s): "AMMONIA" in the last 168 hours. Coagulation profile No results for input(s): "INR", "PROTIME" in the last 168 hours. COVID-19 Labs  No results for input(s): "DDIMER", "FERRITIN", "LDH", "CRP" in the last 72 hours.  Lab Results  Component Value Date   SARSCOV2NAA NEGATIVE 03/25/2022   SARSCOV2NAA NEGATIVE 01/07/2022   SARSCOV2NAA POSITIVE (A) 12/01/2021   [place] NEGATIVE 10/16/2021    CBC: Recent Labs  Lab 07/03/22 0356 07/04/22 0406 07/05/22 0452 07/07/22 0400  WBC 5.7 5.0 5.6 4.7  HGB 7.7*  8.2* 8.4* 7.9*  HCT 24.2* 26.3* 26.3* 25.6*  MCV 90.6 89.5 88.9 90.5  PLT 115* 131* 155 131*    Cardiac Enzymes: Recent Labs  Lab 07/08/22 0400  CKTOTAL 29*    BNP (last 3 results) No results for input(s): "PROBNP" in the last 8760 hours. CBG: Recent Labs  Lab 07/07/22 1634 07/07/22 1946 07/08/22 0013 07/08/22 0456 07/08/22 0815  GLUCAP 106* 113* 87 74 91    D-Dimer: No results for input(s): "DDIMER" in the last 72 hours. Hgb A1c: No results for input(s): "HGBA1C" in the last 72 hours. Lipid Profile: No results for input(s): "CHOL", "HDL", "LDLCALC", "TRIG", "CHOLHDL", "LDLDIRECT" in the last 72 hours. Thyroid function studies: No results for input(s): "TSH", "T4TOTAL", "T3FREE", "THYROIDAB" in the last 72 hours.  Invalid input(s): "FREET3" Anemia work up: No results for input(s): "VITAMINB12", "FOLATE", "FERRITIN", "TIBC", "IRON", "RETICCTPCT" in the last 72 hours. Sepsis Labs: Recent Labs  Lab 07/03/22 0356 07/04/22 0406 07/05/22 0452 07/07/22 0400   WBC 5.7 5.0 5.6 4.7    Microbiology Recent Results (from the past 240 hour(s))  Surgical pcr screen     Status: Abnormal   Collection Time: 06/28/22  2:19 PM   Specimen: Nasal Mucosa; Nasal Swab  Result Value Ref Range Status   MRSA, PCR NEGATIVE NEGATIVE Final   Staphylococcus aureus POSITIVE (A) NEGATIVE Final    Comment: (NOTE) The Xpert SA Assay (FDA approved for NASAL specimens in patients 41 years of age and older), is one component of a comprehensive surveillance program. It is not intended to diagnose infection nor to guide or monitor treatment. Performed at [facility], [place] [address], [place] [address]   Aerobic/Anaerobic Culture w Gram Stain (surgical/deep wound)     Status: None   Collection Time: 06/28/22  3:34 PM   Specimen: PATH Other; Tissue  Result Value Ref Range Status   Specimen Description WOUND  Final   Special Requests PARASPINAL MUSCLE TEST CLINDA  Final   Gram Stain NO WBC SEEN NO ORGANISMS SEEN   Final   Culture   Final    No growth aerobically or anaerobically. Performed at [facility], [place] [address] [place], [place] [address]    Report Status 07/03/2022 FINAL  Final  Aerobic/Anaerobic Culture w Gram Stain (surgical/deep wound)     Status: None   Collection Time: 06/28/22  4:12 PM   Specimen: PATH Other; Tissue  Result Value Ref Range Status   Specimen Description WOUND  Final   Special Requests THORACIC DISC SPACE  Final   Gram Stain NO WBC SEEN NO ORGANISMS SEEN   Final   Culture   Final    No growth aerobically or anaerobically. Performed at [facility], [place] [address], [place], [place] [address]    Report Status 07/03/2022 FINAL  Final  Aerobic/Anaerobic Culture w Gram Stain (surgical/deep wound)     Status: None   Collection Time: 06/28/22  4:15 PM   Specimen: PATH Other; Tissue  Result Value Ref Range Status   Specimen Description SITE NOT SPECIFIED  Final   Special Requests  SCREWS , CAPS , RODS   Final   Gram Stain   Final    RARE WBC PRESENT, PREDOMINANTLY MONONUCLEAR NO ORGANISMS SEEN    Culture   Final    No growth aerobically or anaerobically. Performed at [facility], [place] [address], [place], [place] [address]    Report Status 07/04/2022 FINAL  Final  Medications:    sodium chloride   Intravenous Once   amLODipine  5 mg Oral Daily   carvedilol  12.5 mg Oral Daily   Chlorhexidine Gluconate Cloth  6 each Topical Daily   cholecalciferol  5,000 Units Oral Daily   [START ON 07/10/2022] darbepoetin (ARANESP) injection - NON-DIALYSIS  60 mcg Subcutaneous Once   DULoxetine  60 mg Oral Daily   enoxaparin (LOVENOX) injection  90 mg Subcutaneous Q24H   feeding supplement (GLUCERNA SHAKE)  237 mL Oral TID BM   folic acid  2 mg Oral Daily   furosemide  20 mg Oral Daily   insulin aspart  0-9 Units Subcutaneous Q4H   insulin glargine-yfgn  8 Units Subcutaneous QHS   mometasone-formoterol  2 puff Inhalation BID   montelukast  10 mg Oral QHS   multivitamin with minerals  1 tablet Oral Daily   mupirocin cream   Topical Daily   pregabalin  25 mg Oral TID   sodium bicarbonate  650 mg Oral BID   sodium chloride flush  3 mL Intravenous Q12H   tamsulosin  0.4 mg Oral Daily   Continuous Infusions:  sodium chloride     DAPTOmycin (CUBICIN) 500 mg in sodium chloride 0.9 % IVPB 500 mg (07/07/22 2043)      LOS: 18 days   Charlynne Cousins  [facility]  07/08/2022, 8:23 AM

## 2022-07-09 DIAGNOSIS — N179 Acute kidney failure, unspecified: Secondary | ICD-10-CM | POA: Diagnosis not present

## 2022-07-09 DIAGNOSIS — I10 Essential (primary) hypertension: Secondary | ICD-10-CM | POA: Diagnosis not present

## 2022-07-09 DIAGNOSIS — I82503 Chronic embolism and thrombosis of unspecified deep veins of lower extremity, bilateral: Secondary | ICD-10-CM | POA: Diagnosis not present

## 2022-07-09 DIAGNOSIS — M869 Osteomyelitis, unspecified: Secondary | ICD-10-CM | POA: Diagnosis not present

## 2022-07-09 LAB — BASIC METABOLIC PANEL
Anion gap: 9 (ref 5–15)
BUN: 36 mg/dL — ABNORMAL HIGH (ref 8–23)
CO2: 28 mmol/L (ref 22–32)
Calcium: 8.8 mg/dL — ABNORMAL LOW (ref 8.9–10.3)
Chloride: 104 mmol/L (ref 98–111)
Creatinine, Ser: 4.03 mg/dL — ABNORMAL HIGH (ref 0.44–1.00)
GFR, Estimated: 12 mL/min — ABNORMAL LOW (ref >60)
Glucose, Bld: 123 mg/dL — ABNORMAL HIGH (ref 70–99)
Potassium: 4.7 mmol/L (ref 3.5–5.1)
Sodium: 141 mmol/L (ref 135–145)

## 2022-07-09 LAB — GLUCOSE, CAPILLARY
Glucose-Capillary: 68 mg/dL — ABNORMAL LOW (ref 70–99)
Glucose-Capillary: 88 mg/dL (ref 70–99)
Glucose-Capillary: 104 mg/dL — ABNORMAL HIGH (ref 70–99)
Glucose-Capillary: 115 mg/dL — ABNORMAL HIGH (ref 70–99)
Glucose-Capillary: 122 mg/dL — ABNORMAL HIGH (ref 70–99)
Glucose-Capillary: 95 mg/dL (ref 70–99)

## 2022-07-09 LAB — HEPARIN ANTI-XA: Heparin LMW: 0.51 IU/mL

## 2022-07-09 MED ORDER — HYDROCODONE-ACETAMINOPHEN 5-325 MG PO TABS: 1.0000 | ORAL_TABLET | Freq: Four times a day (QID) | ORAL | 0 refills | Status: DC | PRN

## 2022-07-09 MED ORDER — ENOXAPARIN SODIUM 100 MG/ML IJ SOSY
100.0000 mg | PREFILLED_SYRINGE | INTRAMUSCULAR | Status: DC
  Administered 2022-07-10 – 2022-07-11 (×2): 100 mg via SUBCUTANEOUS
  Filled 2022-07-09 (×2): qty 1

## 2022-07-09 NOTE — Progress Notes (Signed)
ANTICOAGULATION CONSULT NOTE - follow up  Pharmacy Consult for  full dose Lovenox Indication: chronic DVT  Allergies  Allergen Reactions   Bee Pollen Anaphylaxis   Broccoli [Brassica Oleracea] Anaphylaxis and Hives   Fish-Derived Products Anaphylaxis and Hives   Mushroom Extract Complex Anaphylaxis   Penicillins Anaphylaxis    **Tolerated cefepime March 2021 Did it involve swelling of the face/tongue/throat, SOB, or low BP? Yes Did it involve sudden or severe rash/hives, skin peeling, or any reaction on the inside of your mouth or nose? No Did you need to seek medical attention at a hospital or doctor's office? Yes When did it last happen?  A few months ago If all above answers are "NO", may proceed with cephalosporin use.   Rosemary Oil Anaphylaxis   Shellfish Allergy Anaphylaxis and Hives   Tomato Anaphylaxis and Hives    Raw tomatoes only, can tolerate if cooked   Acetaminophen Other (See Comments)    GI upset   Aloe Vera Hives   Acyclovir And Related Other (See Comments)    Unknown reaction   Naproxen Hives    Patient Measurements: Height: 4' 11"  (149.9 cm) Weight: 88.4 kg (194 lb 14.2 oz) IBW/kg (Calculated) : 43.2   Vital Signs: Temp: 98.1 F (36.7 C) (09/10 1948) Temp Source: Oral (09/10 1948) BP: 152/60 (09/10 1948) Pulse Rate: 70 (09/10 1948)  Labs: Recent Labs    07/07/22 0400 07/08/22 0400 07/09/22 1518  HGB 7.9*  --   --   HCT 25.6*  --   --   PLT 131*  --   --   HEPRLOWMOCWT  --   --  0.51  CREATININE 4.03* 4.13* 4.03*  CKTOTAL  --  29*  --      Estimated Creatinine Clearance (by C-G formula based on SCr of 4.03 mg/dL (H)) Female: 13.3 mL/min (A) Female: 16.3 mL/min (A)   Medications:  PTA Eliquis 2.5 mg BID (last dose: 8/19)(might be taking once daily based on fill records)  Assessment: 65 yoF admitted on 8/22 with possible Discitis/OM, s/p aspiration on 8/25. 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  Eliquis  is currently on hold since 06/18/2022.   T10/T11 discitis/vertebral osteomyelitis.  Underwent surgery on 06/28/22. She was started on Lovenox for DVT prophylaxis after surgery. -Neurosurgery recommended to restart Eliquis from 07/03/2022 however Dr. Scharlene Corn noted patient renal function is worse.  Dr. Darrick Meigs noted discontinue Eliquis at this time and start Lovenox full dose per pharmacy for chronic DVT.   UPDATE 07/09/22:  Anti Xa level = 0.51 on 07/09/22 , drawn 5.45 hours after dose given.  Our target is 4hr goal 1-2 units/ml for q24hr treatment dosing.  I doubt that 4 hr level would be 1-2 ut/ml due to  her current renal insuffiency/ AKI but Scr has trended down to 4.03.  Hgb 8.4>7.9 stable, plt 110s-130s.  No bleeding noted MD noted currently on Lovenox until creatinine shows significant improvement.    Plan:  Will increase Lovenox to 100 mg SQ q24h.  Monitor CBC q72 hr Monitor for s/sx of bleeding F/u for ability to resume Eliquis   Nicole Cella, RPh Clinical Pharmacist 07/09/2022 9:03 PM Please utilize [employer] for appropriate phone number to reach the unit pharmacist ([place])

## 2022-07-09 NOTE — Progress Notes (Signed)
[facility] PROGRESS NOTE    Progress Note  [name] [name]  ENI:[id number] DOB: 26-May-1956 DOA: 06/20/2022 PCP: Ann Held, DO     Brief Narrative:   [name] [name] is an 66 y.o. 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 Neurosurgery, ID and interventional radiology were consulted. Patient underwent IR aspiration on 06/23/2022, that grew staph epi on cultures.  Started on IV daptomycin.  Due to difficult access tunneled catheter was placed on 07/04/2022 Patient had worsening creatinine nephrology was reconsulted.   Assessment/Plan:   Recurrent acute kidney injury on chronic kidney disease stage IV: With a baseline creatinine of 2.6-3, peaked at 5.8. Nephrology is managing diuretics her creatinine has plateaued, she had good urine output.  She was started on low-dose Lasix. Patient has not had a renal transplant Physical therapy evaluated the patient, she will go home with home health PT. Further management per nephrology.  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 Disc space aspiration culture grew staph epi found to be susceptible to daptomycin.  She will complete her treatment of IV daptomycin until 09/18/2022. Neurosurgery saw the patient underwent posterior arthrodesis of T8-3 11 and removal of T11 pedicle screws and removal of rods. IR consulted for central tunneled catheter given chronic kidney  disease.   ID signed off on 9 04/03/2022 Antibiotics duration per ID.  Metabolic acidosis: Continue bicarbonate tablets.  Chronic DVT: At home was on Eliquis it was held on 06/18/2022 Underwent surgical intervention and drain has been removed. Neurosurgery recommended to restart Eliquis on 07/03/2022 due to her renal function had to transition to Lovenox. Currently on Lovenox until creatinine shows significant improvement.    Diabetes mellitus type 2: Fairly controlled continue current regimen.  Chronic diastolic heart failure: Appears euvolemic, Lasix was held due to worsening renal function.  Anxiety/depression: Continue Cymbalta and Valium.  Hyperkalemia: Now resolved.   DVT prophylaxis: Lovenox Family Communication:none Status is: Inpatient Remains inpatient appropriate because: Awaiting placement to CIR.    Code Status:     Code Status Orders  (From admission, onward)           Start     Ordered   06/21/22 0021  Full code  Continuous        06/21/22 0025           Code Status History     Date Active Date Inactive Code Status Order ID Comments User Context   01/07/2022 0511 01/09/2022 2045 Full Code [id number]  Rhetta Mura DO Inpatient   10/17/2021 0420 10/22/2021 1928 Full Code [id number]  Chotiner, Yevonne Aline, MD ED   02/18/2020 2241 02/26/2020 2334 Full Code [id number]  Dwyane Dee, MD Inpatient   01/29/2020 1704 02/18/2020 2044 Full Code [id number]  Cathlyn Parsons, PA-C Inpatient   01/29/2020 1704 01/29/2020 1704 Full Code [id number]  Cathlyn Parsons, PA-C Inpatient   01/22/2020 2141 01/29/2020 1650 Full Code [id number]  Vallarie Mare, MD Inpatient   01/21/2020 0012 01/22/2020 2141 Full Code [id number]  Hal Hope,  Doreatha Lew, MD Inpatient   11/15/2019 2200 11/18/2019 2226 Full Code [id number]  Jani Gravel, MD ED   04/15/2019 2122 04/21/2019 2320 Full Code [id number]  Elwyn Reach, MD Inpatient   04/06/2017 2004 04/11/2017 2109 Full Code [id number]  Edwin Dada, MD Inpatient   09/29/2016 1831 10/02/2016 1643 Full Code [id number]  [name], MD Inpatient   03/19/2013 1507 03/21/2013 1812 Full Code [id number]  [name], MD Inpatient   02/10/2013 2124 02/20/2013 1859 Full Code [id number]  [name], MD Inpatient   02/03/2013 2137 02/10/2013 2124 Full Code [id number]  Bynum Bellows, MD Inpatient   01/11/2012 0612 01/13/2012 1645 Full Code [id number]  Herb Grays, RN Inpatient         IV Access:   Peripheral IV   Procedures and diagnostic studies:   No results found.   Medical Consultants:   None.   Subjective:    [name] [name] no complaints this morning.  Objective:    Vitals:   07/08/22 1731 07/08/22 2045 07/09/22 0508 07/09/22 0824  BP: 116/71  121/79 (!) 160/69  Pulse: (!) 55 64 66 (!) 57  Resp:  18 18 16   Temp: 97.7 F (36.5 C)  97.9 F (36.6 C) 98 F (36.7 C)  TempSrc: Oral  Oral Oral  SpO2: 94% 96% 95% 98%  Weight:      Height:       SpO2: 98 % O2 Flow Rate (L/min): 2 L/min FiO2 (%): 21 %   Intake/Output Summary (Last 24 hours) at 07/09/2022 0839 Last data filed at 07/09/2022 0500 Gross per 24 hour  Intake --  Output 1700 ml  Net -1700 ml    Filed Weights   07/06/22 0500 07/07/22 0500 07/08/22 0457  Weight: 89.5 kg 87.7 kg 88.4 kg    Exam: General exam: In no acute distress. Respiratory system: Good air movement and clear to auscultation. Cardiovascular system: S1 & S2 heard, RRR. No JVD. Gastrointestinal system: Abdomen is nondistended, soft and nontender.  Extremities: No pedal edema. Skin: No rashes, lesions or ulcers Psychiatry: Judgement and insight appear normal. Mood & affect appropriate.  Data Reviewed:    Labs: Basic Metabolic Panel: Recent Labs  Lab 07/04/22 0406 07/05/22 0452 07/06/22 1206 07/07/22 0400 07/08/22 0400  NA 139 140 139 139 138  K 4.8 4.5 4.7 4.5 4.3  CL 111 109 109 107 105  CO2 22 22 25 26 26   GLUCOSE 93 94 127* 72 73  BUN 54*  46* 43* 42* 38*  CREATININE 5.23* 4.46* 4.15* 4.03* 4.13*  CALCIUM 8.6* 8.8* 8.6* 8.7* 8.8*    GFR Estimated Creatinine Clearance (by C-G formula based on SCr of 4.13 mg/dL (H)) Female: 13 mL/min (A) Female: 15.9 mL/min (A) Liver Function Tests: Recent Labs  Lab 07/04/22 0406  AST 15  ALT 13  ALKPHOS 94  BILITOT 0.3  PROT 6.5  ALBUMIN 2.2*    No results for input(s): "LIPASE", "AMYLASE" in the last 168 hours. No results for input(s): "AMMONIA" in the last 168 hours. Coagulation profile No results for input(s): "INR", "PROTIME" in the last 168 hours. COVID-19 Labs  No results for input(s): "DDIMER", "FERRITIN", "LDH", "CRP" in the last 72 hours.  Lab Results  Component Value Date   SARSCOV2NAA NEGATIVE 03/25/2022   SARSCOV2NAA NEGATIVE 01/07/2022   SARSCOV2NAA POSITIVE (A) 12/01/2021   [place] NEGATIVE 10/16/2021    CBC: Recent Labs  Lab 07/03/22 0356 07/04/22 0406 07/05/22 0452 07/07/22 0400  WBC 5.7 5.0 5.6 4.7  HGB 7.7* 8.2* 8.4* 7.9*  HCT 24.2* 26.3* 26.3* 25.6*  MCV 90.6 89.5 88.9 90.5  PLT 115* 131* 155 131*    Cardiac Enzymes: Recent Labs  Lab 07/08/22 0400  CKTOTAL 29*    BNP (last 3 results) No results for input(s): "PROBNP" in the last 8760 hours. CBG: Recent Labs  Lab 07/08/22 1715 07/08/22 2117 07/08/22 2351 07/09/22 0307 07/09/22 0814  GLUCAP 103* 74 70 68* 88    D-Dimer: No results for input(s): "DDIMER" in the last 72 hours. Hgb A1c: No results for input(s): "HGBA1C" in the last 72 hours. Lipid Profile: No results for input(s): "CHOL", "HDL", "LDLCALC", "TRIG", "CHOLHDL", "LDLDIRECT" in the last 72 hours. Thyroid function studies: No results for input(s): "TSH", "T4TOTAL", "T3FREE", "THYROIDAB" in the last 72 hours.  Invalid input(s): "FREET3" Anemia work up: No results for input(s): "VITAMINB12", "FOLATE", "FERRITIN", "TIBC", "IRON", "RETICCTPCT" in the last 72 hours. Sepsis Labs: Recent Labs  Lab 07/03/22 0356  07/04/22 0406 07/05/22 0452 07/07/22 0400  WBC 5.7 5.0 5.6 4.7    Microbiology No results found for this or any previous visit (from the past 240 hour(s)).    Medications:    sodium chloride   Intravenous Once   amLODipine  5 mg Oral Daily   carvedilol  12.5 mg Oral Daily   Chlorhexidine Gluconate Cloth  6 each Topical Daily   cholecalciferol  5,000 Units Oral Daily   [START ON 07/10/2022] darbepoetin (ARANESP) injection - NON-DIALYSIS  60 mcg Subcutaneous Once   DULoxetine  60 mg Oral Daily   enoxaparin (LOVENOX) injection  90 mg Subcutaneous Q24H   feeding supplement (GLUCERNA SHAKE)  237 mL Oral TID BM   folic acid  2 mg Oral Daily   furosemide  20 mg Oral Daily   insulin aspart  0-9 Units Subcutaneous Q4H   insulin glargine-yfgn  8 Units Subcutaneous QHS   mometasone-formoterol  2 puff Inhalation BID   montelukast  10 mg Oral QHS   multivitamin with minerals  1 tablet Oral Daily   mupirocin cream   Topical Daily   pregabalin  25 mg Oral TID   sodium bicarbonate  650 mg Oral BID   sodium chloride flush  3 mL Intravenous Q12H   tamsulosin  0.4 mg Oral Daily   Continuous Infusions:  sodium chloride     DAPTOmycin (CUBICIN) 500 mg in sodium chloride 0.9 % IVPB 500 mg (07/07/22 2043)      LOS: 19 days   Charlynne Cousins  [facility]  07/09/2022, 8:39 AM

## 2022-07-10 DIAGNOSIS — I10 Essential (primary) hypertension: Secondary | ICD-10-CM | POA: Diagnosis not present

## 2022-07-10 DIAGNOSIS — N179 Acute kidney failure, unspecified: Secondary | ICD-10-CM | POA: Diagnosis not present

## 2022-07-10 DIAGNOSIS — I82503 Chronic embolism and thrombosis of unspecified deep veins of lower extremity, bilateral: Secondary | ICD-10-CM | POA: Diagnosis not present

## 2022-07-10 DIAGNOSIS — M869 Osteomyelitis, unspecified: Secondary | ICD-10-CM | POA: Diagnosis not present

## 2022-07-10 LAB — BASIC METABOLIC PANEL
Anion gap: 9 (ref 5–15)
BUN: 37 mg/dL — ABNORMAL HIGH (ref 8–23)
CO2: 26 mmol/L (ref 22–32)
Calcium: 8.8 mg/dL — ABNORMAL LOW (ref 8.9–10.3)
Chloride: 104 mmol/L (ref 98–111)
Creatinine, Ser: 4.11 mg/dL — ABNORMAL HIGH (ref 0.44–1.00)
GFR, Estimated: 11 mL/min — ABNORMAL LOW (ref >60)
Glucose, Bld: 89 mg/dL (ref 70–99)
Potassium: 4.5 mmol/L (ref 3.5–5.1)
Sodium: 139 mmol/L (ref 135–145)

## 2022-07-10 LAB — GLUCOSE, CAPILLARY
Glucose-Capillary: 116 mg/dL — ABNORMAL HIGH (ref 70–99)
Glucose-Capillary: 118 mg/dL — ABNORMAL HIGH (ref 70–99)
Glucose-Capillary: 118 mg/dL — ABNORMAL HIGH (ref 70–99)
Glucose-Capillary: 91 mg/dL (ref 70–99)
Glucose-Capillary: 93 mg/dL (ref 70–99)
Glucose-Capillary: 96 mg/dL (ref 70–99)

## 2022-07-10 LAB — CBC
HCT: 26.2 % — ABNORMAL LOW (ref 36.0–46.0)
Hemoglobin: 8 g/dL — ABNORMAL LOW (ref 12.0–15.0)
MCH: 27.6 pg (ref 26.0–34.0)
MCHC: 30.5 g/dL (ref 30.0–36.0)
MCV: 90.3 fL (ref 80.0–100.0)
Platelets: 124 10*3/uL — ABNORMAL LOW (ref 150–400)
RBC: 2.9 MIL/uL — ABNORMAL LOW (ref 3.87–5.11)
RDW: 15.9 % — ABNORMAL HIGH (ref 11.5–15.5)
WBC: 5.2 10*3/uL (ref 4.0–10.5)
nRBC: 0 % (ref 0.0–0.2)

## 2022-07-10 NOTE — Progress Notes (Signed)
Physical Therapy Treatment Patient Details Name: [name] [name] MRN: [id number] DOB: 04/18/1956 Today's Date: 07/10/2022   History of Present Illness Pt is a 66 y.o. female presenting 8/30 s/p posterior exploration/revision of posteriuor thoracic 11- Lumbar 3, extension of fusion T9-11 with vertebral augmentation. PMH significant for spinal surgery, chronic diastolic heart failure, CKD stage IV, renal transplant, chronic DVTs, DMII, and blindness.    PT Comments    Pt slowly progressing towards goals. Pt cooperative and pleasant as long as she has her pain meds 1hr before therapy and TLSO is donned in supine. Pt with improved ambulation tolerance today and completed 2 separate bouts. Pt continues with posterior bias in standing and ambulation requiring verbal and tactile cues to place weight through hand on the walker. Due to impaired vision pt requiring minA for walker management. Acute PT to cont to follow.    Recommendations for follow up therapy are one component of a multi-disciplinary discharge planning process, led by the attending physician.  Recommendations may be updated based on patient status, additional functional criteria and insurance authorization.  Follow Up Recommendations  Home health PT Can patient physically be transported by private vehicle: Yes   Assistance Recommended at Discharge Frequent or constant Supervision/Assistance  Patient can return home with the following A lot of help with bathing/dressing/bathroom;Assistance with cooking/housework;Assist for transportation;Help with stairs or ramp for entrance;A lot of help with walking and/or transfers   Equipment Recommendations  Other (comment)    Recommendations for Other Services       Precautions / Restrictions Precautions Precautions: Fall;Back Precaution Booklet Issued: No Precaution Comments: Reviewed precautions during functional mobility. Pt with low vision. Required Braces or Orthoses: Spinal  Brace Spinal Brace: Thoracolumbosacral orthotic;Applied in supine position (order for sitting but patient prefers supine) Restrictions Weight Bearing Restrictions: No Other Position/Activity Restrictions: no bending, lifting, twisting     Mobility  Bed Mobility Overal bed mobility: Needs Assistance Bed Mobility: Rolling, Sidelying to Sit Rolling: Min guard Sidelying to sit: Min assist       General bed mobility comments: rolling for brace application in supine with A for hips, to sit up assist for lifting trunk patient able to assist more today.    Transfers Overall transfer level: Needs assistance Equipment used: Rolling walker (2 wheels) (youth height) Transfers: Sit to/from Stand Sit to Stand: Min assist, Mod assist (minA from chair, modA from bed)           General transfer comment: max directional verbal cues for safe hand placement    Ambulation/Gait Ambulation/Gait assistance: Mod assist, +2 safety/equipment Gait Distance (Feet): 12 Feet (x1, 22'x1) Assistive device: Rolling walker (2 wheels) Gait Pattern/deviations: Step-to pattern, Step-through pattern, Decreased stride length, Leaning posteriorly Gait velocity: improved second bout     General Gait Details: pt with short step height and length with narrow base of support, pt required PT to push walker forward to maintain reciprocal gait pattern/continued forward momentum. with max encouragement pt agreed to second bout of ambulation and demod improved step length and fluidity   Stairs             Wheelchair Mobility    Modified Rankin (Stroke Patients Only)       Balance Overall balance assessment: Needs assistance Sitting-balance support: Bilateral upper extremity supported, Single extremity supported Sitting balance-Leahy Scale: Fair Sitting balance - Comments: initially reliant on UE support, once relaxed pt closer to min guard Postural control: Posterior lean Standing balance support:  Bilateral upper extremity supported, Reliant  on assistive device for balance Standing balance-Leahy Scale: Poor Standing balance comment: UE support for balance                            Cognition Arousal/Alertness: Awake/alert Behavior During Therapy: Anxious (requires control of session) Overall Cognitive Status: No family/caregiver present to determine baseline cognitive functioning Area of Impairment: Following commands, Safety/judgement, Problem solving                   Current Attention Level: Sustained   Following Commands: Follows one step commands consistently, Follows one step commands with increased time Safety/Judgement: Decreased awareness of safety   Problem Solving: Slow processing General Comments: pt likes to be in control however as long as she has her pain meds 1 hr before and dons her TLSO in supine pt compliant, cooperative, and pleasant        Exercises      General Comments General comments (skin integrity, edema, etc.): pt c/o SOB, SpO2 95% on 1lo2 via [place]      Pertinent Vitals/Pain Pain Assessment Pain Assessment: Faces Faces Pain Scale: Hurts little more Pain Location: R side Pain Descriptors / Indicators: Aching, Guarding, Grimacing, Discomfort Pain Intervention(s): Monitored during session, Premedicated before session    Home Living                          Prior Function            PT Goals (current goals can now be found in the care plan section) Acute Rehab PT Goals Patient Stated Goal: Be able to go home PT Goal Formulation: With patient Time For Goal Achievement: 07/13/22 Potential to Achieve Goals: Fair Progress towards PT goals: Progressing toward goals    Frequency    Min 5X/week      PT Plan Current plan remains appropriate    Co-evaluation              AM-PAC PT "6 Clicks" Mobility   Outcome Measure  Help needed turning from your back to your side while in a flat bed without  using bedrails?: A Little Help needed moving from lying on your back to sitting on the side of a flat bed without using bedrails?: A Little Help needed moving to and from a bed to a chair (including a wheelchair)?: A Lot Help needed standing up from a chair using your arms (e.g., wheelchair or bedside chair)?: A Lot Help needed to walk in hospital room?: A Lot Help needed climbing 3-5 steps with a railing? : Total 6 Click Score: 13    End of Session Equipment Utilized During Treatment: Gait belt;Back brace Activity Tolerance: Patient tolerated treatment well Patient left: in chair;with call bell/phone within reach;with chair alarm set Nurse Communication: Mobility status PT Visit Diagnosis: Other abnormalities of gait and mobility (R26.89);Difficulty in walking, not elsewhere classified (R26.2);Pain;Muscle weakness (generalized) (M62.81) Pain - Right/Left: Right Pain - part of body:  (R flank)     Time: 1227-1300 PT Time Calculation (min) (ACUTE ONLY): 33 min  Charges:  $Gait Training: 8-22 mins $Therapeutic Activity: 8-22 mins                     Kittie Plater, PT, DPT Acute Rehabilitation Services Secure chat preferred Office #: [phone]    Berline Lopes 07/10/2022, 2:22 PM

## 2022-07-10 NOTE — Progress Notes (Signed)
Mobility Specialist: Progress Note   07/10/22 1600  Mobility  Activity Refused mobility   Pt refused mobility stating "I don't have to do anything". Pt requesting this mobility specialist return later. Will f/u as able.   [facility] Daelyn Pettaway Mobility Specialist Mobility Specialist [address]: [phone]

## 2022-07-10 NOTE — Progress Notes (Signed)
Mobility Specialist: Progress Note   07/10/22 1726  Mobility  Activity Transferred from chair to bed  Level of Assistance Moderate assist, patient does 50-74%  Assistive Device Front wheel walker  Distance Ambulated (ft) 2 ft  Activity Response Tolerated poorly  $Mobility charge 1 Mobility   Pt received in the chair and assisted back to bed. Attempted in room ambulation but unable secondary to c/o 10/10 pain level. Pt had significant posterior lean when taking steps back to the bed requiring modA to correct. Pt back in bed with call bell and phone in her lap.   [facility] [name] [name] Mobility Specialist Mobility Specialist [address]: [phone]

## 2022-07-10 NOTE — Progress Notes (Signed)
[facility] PROGRESS NOTE    Progress Note  [name] [name]  IPJ:[id number] DOB: 07-Jan-1956 DOA: 06/20/2022 PCP: Ann Held, DO     Brief Narrative:   [name] [name] is an 66 y.o. 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 Neurosurgery, ID and interventional radiology were consulted. Patient underwent IR aspiration on 06/23/2022, that grew staph epi on cultures.  Started on IV daptomycin.  Due to difficult access tunneled catheter was placed on 07/04/2022 Patient had worsening creatinine nephrology was reconsulted.   Assessment/Plan:   Recurrent acute kidney injury on chronic kidney disease stage IV: With a baseline creatinine of 2.6-3, peaked at 5.8. Nephrology is managing diuretics her creatinine has plateaued, she had good urine output.  She was started on low-dose Lasix. Patient has not had a renal transplant Physical therapy evaluated the patient, she will go home with home health PT. Further management per nephrology.  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 Disc space aspiration culture grew staph epi found to be susceptible to daptomycin.   Neurosurgery saw the patient underwent posterior arthrodesis of T8-3 11 and removal of T11 pedicle screws and removal of rods. She will complete her treatment of IV daptomycin until 09/18/2022. IR consulted for central tunneled catheter given chronic kidney  disease.   ID signed off on 9 04/03/2022 Antibiotics duration per ID until 09/18/2022.  Metabolic acidosis: Continue bicarbonate tablets.  Chronic DVT: At home was on Eliquis it was held on 06/18/2022 Underwent surgical intervention and drain has been removed. Neurosurgery recommended to restart Eliquis on 07/03/2022 due to her renal function had to transition to Lovenox. Currently on Lovenox until creatinine shows significant improvement.    Diabetes mellitus type 2: Blood glucose well controlled with sliding scale insulin.  Based  Chronic diastolic heart failure: Appears euvolemic, has been resumed she appears euvolemic.  Anxiety/depression: Continue Cymbalta and Valium.  Hyperkalemia: Now resolved.   DVT prophylaxis: Lovenox Family Communication:none Status is: Inpatient Remains inpatient appropriate because: Awaiting placement to CIR.    Code Status:     Code Status Orders  (From admission, onward)           Start     Ordered   06/21/22 0021  Full code  Continuous        06/21/22 0025           Code Status History     Date Active Date Inactive Code Status Order ID Comments User Context   01/07/2022 0511 01/09/2022 2045 Full Code [id number]  Rhetta Mura DO Inpatient   10/17/2021 0420 10/22/2021 1928 Full Code [id number]  Chotiner, Yevonne Aline, MD ED   02/18/2020 2241 02/26/2020 2334 Full Code [id number]  Dwyane Dee, MD Inpatient   01/29/2020 1704 02/18/2020 2044 Full Code [id number]  Cathlyn Parsons, PA-C Inpatient   01/29/2020 1704 01/29/2020 1704 Full Code [id number]  Cathlyn Parsons, PA-C Inpatient   01/22/2020 2141 01/29/2020 1650 Full Code [id number]  Vallarie Mare, MD Inpatient   01/21/2020 [phone] 01/22/2020  2141 Full Code [id number]  Rise Patience, MD Inpatient   11/15/2019 2200 11/18/2019 2226 Full Code [id number]  Jani Gravel, MD ED   04/15/2019 2122 04/21/2019 2320 Full Code [id number]  Elwyn Reach, MD Inpatient   04/06/2017 2004 04/11/2017 2109  Full Code [id number]  Edwin Dada, MD Inpatient   09/29/2016 1831 10/02/2016 1643 Full Code [id number]  [name], MD Inpatient   03/19/2013 1507 03/21/2013 1812 Full Code [id number]  [name], MD Inpatient   02/10/2013 2124 02/20/2013 1859 Full Code [id number]  [name], MD Inpatient   02/03/2013 2137 02/10/2013 2124 Full Code [id number]  Bynum Bellows, MD Inpatient   01/11/2012 0612 01/13/2012 1645 Full Code [id number]  Herb Grays, RN Inpatient         IV Access:   Peripheral IV   Procedures and diagnostic studies:   No results found.   Medical Consultants:   None.   Subjective:    [name] [name] in acute mood this morning wanting to get out of bed to chair.  Objective:    Vitals:   07/09/22 1948 07/09/22 2000 07/10/22 0400 07/10/22 0753  BP: (!) 152/60  (!) 155/65 (!) 158/79  Pulse: 70   62  Resp: 20   20  Temp: 98.1 F (36.7 C)  98.2 F (36.8 C) 98.3 F (36.8 C)  TempSrc: Oral  Oral Oral  SpO2: (!) 89% 95% 98% 98%  Weight:      Height:       SpO2: 98 % O2 Flow Rate (L/min): 1 L/min FiO2 (%): 21 %   Intake/Output Summary (Last 24 hours) at 07/10/2022 0807 Last data filed at 07/10/2022 0500 Gross per 24 hour  Intake --  Output 1500 ml  Net -1500 ml    Filed Weights   07/06/22 0500 07/07/22 0500 07/08/22 0457  Weight: 89.5 kg 87.7 kg 88.4 kg    Exam: General exam: In no acute distress. Respiratory system: Good air movement and clear to auscultation. Cardiovascular system: S1 & S2 heard, RRR. No JVD. Gastrointestinal system: Abdomen is nondistended, soft and nontender.  Extremities: No pedal edema. Skin: No rashes, lesions or ulcers Psychiatry: Judgement and insight appear normal. Mood & affect appropriate.  Data Reviewed:    Labs: Basic Metabolic Panel: Recent Labs  Lab 07/06/22 1206 07/07/22 0400 07/08/22 0400 07/09/22 1518 07/10/22 0515  NA 139 139 138 141 139  K 4.7 4.5 4.3 4.7 4.5  CL 109 107 105 104  104  CO2 25 26 26 28 26   GLUCOSE 127* 72 73 123* 89  BUN 43* 42* 38* 36* 37*  CREATININE 4.15* 4.03* 4.13* 4.03* 4.11*  CALCIUM 8.6* 8.7* 8.8* 8.8* 8.8*    GFR Estimated Creatinine Clearance (by C-G formula based on SCr of 4.11 mg/dL (H)) Female: 13 mL/min (A) Female: 16 mL/min (A) Liver Function Tests: Recent Labs  Lab 07/04/22 0406  AST 15  ALT 13  ALKPHOS 94  BILITOT 0.3  PROT 6.5  ALBUMIN 2.2*    No results for input(s): "LIPASE", "AMYLASE" in the last 168 hours. No results for input(s): "AMMONIA" in the last 168 hours. Coagulation profile No results for input(s): "INR", "PROTIME" in the last 168 hours. COVID-19 Labs  No results for input(s): "DDIMER", "FERRITIN", "LDH", "CRP" in the last 72 hours.  Lab Results  Component Value Date   SARSCOV2NAA NEGATIVE 03/25/2022   SARSCOV2NAA NEGATIVE 01/07/2022   SARSCOV2NAA POSITIVE (A) 12/01/2021   [place] NEGATIVE 10/16/2021  CBC: Recent Labs  Lab 07/04/22 0406 07/05/22 0452 07/07/22 0400 07/10/22 0515  WBC 5.0 5.6 4.7 5.2  HGB 8.2* 8.4* 7.9* 8.0*  HCT 26.3* 26.3* 25.6* 26.2*  MCV 89.5 88.9 90.5 90.3  PLT 131* 155 131* 124*    Cardiac Enzymes: Recent Labs  Lab 07/08/22 0400  CKTOTAL 29*    BNP (last 3 results) No results for input(s): "PROBNP" in the last 8760 hours. CBG: Recent Labs  Lab 07/09/22 1149 07/09/22 1622 07/09/22 1941 07/09/22 2323 07/10/22 0305  GLUCAP 104* 115* 122* 95 93    D-Dimer: No results for input(s): "DDIMER" in the last 72 hours. Hgb A1c: No results for input(s): "HGBA1C" in the last 72 hours. Lipid Profile: No results for input(s): "CHOL", "HDL", "LDLCALC", "TRIG", "CHOLHDL", "LDLDIRECT" in the last 72 hours. Thyroid function studies: No results for input(s): "TSH", "T4TOTAL", "T3FREE", "THYROIDAB" in the last 72 hours.  Invalid input(s): "FREET3" Anemia work up: No results for input(s): "VITAMINB12", "FOLATE", "FERRITIN", "TIBC", "IRON", "RETICCTPCT" in the  last 72 hours. Sepsis Labs: Recent Labs  Lab 07/04/22 0406 07/05/22 0452 07/07/22 0400 07/10/22 0515  WBC 5.0 5.6 4.7 5.2    Microbiology No results found for this or any previous visit (from the past 240 hour(s)).    Medications:    sodium chloride   Intravenous Once   amLODipine  5 mg Oral Daily   carvedilol  12.5 mg Oral Daily   Chlorhexidine Gluconate Cloth  6 each Topical Daily   cholecalciferol  5,000 Units Oral Daily   darbepoetin (ARANESP) injection - NON-DIALYSIS  60 mcg Subcutaneous Once   DULoxetine  60 mg Oral Daily   enoxaparin (LOVENOX) injection  100 mg Subcutaneous Q24H   feeding supplement (GLUCERNA SHAKE)  237 mL Oral TID BM   folic acid  2 mg Oral Daily   furosemide  20 mg Oral Daily   insulin aspart  0-9 Units Subcutaneous Q4H   insulin glargine-yfgn  8 Units Subcutaneous QHS   mometasone-formoterol  2 puff Inhalation BID   montelukast  10 mg Oral QHS   multivitamin with minerals  1 tablet Oral Daily   mupirocin cream   Topical Daily   pregabalin  25 mg Oral TID   sodium bicarbonate  650 mg Oral BID   sodium chloride flush  3 mL Intravenous Q12H   tamsulosin  0.4 mg Oral Daily   Continuous Infusions:  sodium chloride     DAPTOmycin (CUBICIN) 500 mg in sodium chloride 0.9 % IVPB Stopped (07/09/22 2300)      LOS: 20 days   Charlynne Cousins  [facility]  07/10/2022, 8:07 AM

## 2022-07-11 ENCOUNTER — Other Ambulatory Visit (HOSPITAL_COMMUNITY): Payer: Self-pay

## 2022-07-11 ENCOUNTER — Encounter: Payer: Self-pay | Admitting: Family

## 2022-07-11 ENCOUNTER — Encounter: Admit: 2022-07-11 | Discharge: 2022-07-11 | Payer: MEDICARE

## 2022-07-11 DIAGNOSIS — E1122 Type 2 diabetes mellitus with diabetic chronic kidney disease: Secondary | ICD-10-CM | POA: Diagnosis not present

## 2022-07-11 DIAGNOSIS — B955 Unspecified streptococcus as the cause of diseases classified elsewhere: Secondary | ICD-10-CM

## 2022-07-11 DIAGNOSIS — M869 Osteomyelitis, unspecified: Secondary | ICD-10-CM | POA: Diagnosis not present

## 2022-07-11 DIAGNOSIS — N179 Acute kidney failure, unspecified: Secondary | ICD-10-CM | POA: Diagnosis not present

## 2022-07-11 DIAGNOSIS — R7881 Bacteremia: Secondary | ICD-10-CM

## 2022-07-11 DIAGNOSIS — I82503 Chronic embolism and thrombosis of unspecified deep veins of lower extremity, bilateral: Secondary | ICD-10-CM | POA: Diagnosis not present

## 2022-07-11 LAB — BASIC METABOLIC PANEL
Anion gap: 6 (ref 5–15)
BUN: 32 mg/dL — ABNORMAL HIGH (ref 8–23)
CO2: 26 mmol/L (ref 22–32)
Calcium: 8.7 mg/dL — ABNORMAL LOW (ref 8.9–10.3)
Chloride: 108 mmol/L (ref 98–111)
Creatinine, Ser: 4.07 mg/dL — ABNORMAL HIGH (ref 0.44–1.00)
GFR, Estimated: 12 mL/min — ABNORMAL LOW (ref >60)
Glucose, Bld: 99 mg/dL (ref 70–99)
Potassium: 4.3 mmol/L (ref 3.5–5.1)
Sodium: 140 mmol/L (ref 135–145)

## 2022-07-11 LAB — GLUCOSE, CAPILLARY
Glucose-Capillary: 82 mg/dL (ref 70–99)
Glucose-Capillary: 119 mg/dL — ABNORMAL HIGH (ref 70–99)

## 2022-07-11 MED ORDER — CARVEDILOL 12.5 MG PO TABS
12.5000 mg | ORAL_TABLET | Freq: Every day | ORAL | 3 refills | Status: DC
  Filled 2022-07-11: qty 60, 60d supply, fill #0

## 2022-07-11 MED ORDER — SODIUM BICARBONATE 650 MG PO TABS
650.0000 mg | ORAL_TABLET | Freq: Two times a day (BID) | ORAL | 3 refills | Status: DC
  Filled 2022-07-11: qty 60, 30d supply, fill #0

## 2022-07-11 MED ORDER — PREGABALIN 25 MG PO CAPS: 25.0000 mg | ORAL_CAPSULE | Freq: Three times a day (TID) | ORAL | 1 refills | Status: DC

## 2022-07-11 MED ORDER — INSULIN PEN NEEDLE 32G X 4 MM MISC
0 refills | Status: DC
  Filled 2022-07-11: qty 100, 30d supply, fill #0

## 2022-07-11 MED ORDER — SODIUM CHLORIDE 0.9 % IV SOLN
500.0000 mg | INTRAVENOUS | Status: DC
  Administered 2022-07-11: 500 mg via INTRAVENOUS
  Filled 2022-07-11: qty 10

## 2022-07-11 MED ORDER — AMLODIPINE BESYLATE 5 MG PO TABS
5.0000 mg | ORAL_TABLET | Freq: Every day | ORAL | 0 refills | Status: DC
  Filled 2022-07-11: qty 30, 30d supply, fill #0

## 2022-07-11 MED ORDER — TAMSULOSIN HCL 0.4 MG PO CAPS
0.4000 mg | ORAL_CAPSULE | Freq: Every day | ORAL | 0 refills | Status: DC
  Filled 2022-07-11: qty 30, 30d supply, fill #0

## 2022-07-11 MED ORDER — DAPTOMYCIN IV (FOR PTA / DISCHARGE USE ONLY): 500.0000 mg | INTRAVENOUS | 0 refills | Status: AC

## 2022-07-11 MED ORDER — LANTUS SOLOSTAR 100 UNIT/ML ~~LOC~~ SOPN
PEN_INJECTOR | SUBCUTANEOUS | 3 refills | Status: DC
  Filled 2022-07-11: qty 3, 28d supply, fill #0

## 2022-07-11 NOTE — Progress Notes (Signed)
Mobility Specialist: Progress Note   07/11/22 1603  Mobility  Activity Refused mobility   Pt refused mobility stating she is discharging soon.  [facility] [place] Mobility Specialist Mobility Specialist [address]: [phone]

## 2022-07-11 NOTE — Discharge Summary (Addendum)
Physician Discharge Summary  [name] [name] ZOX:[id number] DOB: August 20, 1956 DOA: 06/20/2022  PCP: Ann Held, DO  Admit date: 06/20/2022 Discharge date: 07/11/2022  Admitted From: Home Disposition:  Home  Recommendations for Outpatient Follow-up:  Follow up with PCP in 1-2 weeks Please obtain BMP/CBC in one week Please follow-up with neurology in 2 to 4 weeks.  Home Health:Yes Equipment/Devices:none  Discharge Condition:Stable CODE STATUS:Full Diet recommendation: Heart Healthy  Brief/Interim Summary: 66 y.o. 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 Neurosurgery, ID and interventional radiology were consulted. Patient underwent IR aspiration on 06/23/2022, that grew staph epi on cultures.  Started on IV daptomycin.  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 Nephrology was consulted who started her on IV Lasix and she diuresed. Her creatinine remained stable at  4. The patient is not a renal transplant as she relates. Physical therapy evaluated the patient recommended skilled nursing facility, which the patient refused she was sent home with home health.  T10/T11 discitis vertebral osteomyelitis: CT scan on admission showed discitis and osteomyelitis of T10-T11. The surgery was consulted and patient underwent posterior arthrodesis T8-T11 and removal of T11 pedicles screws and rod removal. Disc aspiration grew staph epi found susceptible to daptomycin. IR was consulted and tunneled catheter was placed with nephrology's agreement. ID was consulted and recommended IV daptomycin until 09/18/2022.  Diabetes mellitus type 2: 100s admit to her medication continue current regimen.  Metabolic acidosis: Continue bicarbonate tablets.  Chronic DVT: At home she was on Eliquis it was held on 06/18/2022. Underwent surgical intervention was started on Lovenox and then on transition home she was sent on low-dose Eliquis.  Chronic diastolic heart failure: Appears euvolemic, nephrology recommended to continue Lasix and follow-up with them in 4 to 4 weeks.  Anxiety/depression: Continue Cymbalta and Valium.  Hyperkalemia: Resolved with Kayexalate.    Discharge Instructions  Discharge Instructions     [facility] pharmacist to adjust dose for Vancomycin, Aminoglycosides and other anti-infective therapies as requested by physician.   Complete by: As directed    [facility] to provide Cath Flo 58m   Complete by: As directed    Administer for PICC line occlusion and as ordered by physician for other access device issues.   Anaphylaxis Kit: Provided to treat any anaphylactic reaction to the medication being provided to the patient if First Dose or when requested by physician   Complete by: As directed    Epinephrine 150mml vial / amp: Administer 0.34m74m0.34ml34mubcutaneously once for moderate to severe  anaphylaxis, nurse to call  physician and pharmacy when reaction occurs and call 911 if needed for immediate care   Diphenhydramine 36m/ml IV vial: Administer 25-523mIV/IM PRN for first dose reaction, rash, itching, mild reaction, nurse to call physician and pharmacy when reaction occurs   Sodium Chloride 0.9% NS 50068mV: Administer if needed for hypovolemic blood pressure drop or as ordered by physician after call to physician with anaphylactic reaction   Change dressing on IV access line weekly and PRN   Complete by: As directed    Diet - low sodium heart healthy   Complete by: As directed    Flush IV access with Sodium Chloride 0.9% and Heparin 10 units/ml or 100 units/ml   Complete by: As directed    Home infusion instructions - [facility]   Complete by: As directed    Instructions: Flush IV access with Sodium Chloride 0.9% and Heparin 10units/ml or 100units/ml   Change dressing on IV access line: Weekly and PRN   Instructions Cath Flo 2mg54mdminister for PICC Line occlusion and as ordered by physician for other access device   [facility] pharmacist to adjust dose for: Vancomycin, Aminoglycosides and other anti-infective therapies as requested by physician   Incentive spirometry RT   Complete by: As directed    Increase activity slowly   Complete by: As directed    Method of administration may be changed at the discretion of home infusion pharmacist based upon assessment of the patient and/or caregiver's ability to self-administer the medication ordered   Complete by: As directed    No wound care   Complete by: As directed       Allergies as of 07/11/2022       Reactions   Bee Pollen Anaphylaxis   Broccoli [brassica Oleracea] Anaphylaxis, Hives   Fish-derived Products Anaphylaxis, Hives   Mushroom Extract Complex Anaphylaxis   Penicillins Anaphylaxis   **Tolerated cefepime March 2021 Did it involve swelling of the face/tongue/throat, SOB, or low BP? Yes Did it involve sudden or  severe rash/hives, skin peeling, or any reaction on the inside of your mouth or nose? No Did you need to seek medical attention at a hospital or doctor's office? Yes When did it last happen?  A few months ago If all above answers are "NO", may proceed with cephalosporin use.   Rosemary Oil Anaphylaxis   Shellfish Allergy Anaphylaxis, Hives   Tomato Anaphylaxis, Hives   Raw tomatoes only, can tolerate if cooked   Acetaminophen Other (See Comments)   GI upset   Aloe Vera Hives   Acyclovir And Related Other (See Comments)   Unknown reaction   Naproxen Hives        Medication List     TAKE these medications    acetaminophen 325 MG tablet Commonly known as: TYLENOL Take 2 tablets (650 mg total) by mouth every 6 (six) hours as needed for mild pain, fever or headache (or Fever >/= 101). Discontinue 500/1000 mg dosing   Advair HFA 115-21 MCG/ACT inhaler Generic drug: fluticasone-salmeterol Inhale 2 puffs into the lungs 2 (two) times daily. What changed:  when to take this reasons to take this   albuterol (2.5 MG/3ML) 0.083% nebulizer solution Commonly known as: PROVENTIL Take 3 mLs (2.5 mg total) by nebulization every 6 (six) hours as needed for wheezing or shortness of breath.   albuterol 108 (90 Base) MCG/ACT inhaler Commonly known as: VENTOLIN HFA Inhale 2 puffs into the lungs every 6 (six) hours  as needed for wheezing or shortness of breath.   amLODipine 5 MG tablet Commonly known as: NORVASC Take 1 tablet (5 mg total) by mouth daily.   apixaban 2.5 MG Tabs tablet Commonly known as: Eliquis Take 1 tablet (2.5 mg total) by mouth 2 (two) times daily.   atorvastatin 10 MG tablet Commonly known as: LIPITOR Take 1 tablet (10 mg total) by mouth daily.   carvedilol 12.5 MG tablet Commonly known as: COREG Take 1 tablet (12.5 mg total) by mouth daily. What changed:  medication strength how much to take when to take this   daptomycin  IVPB Commonly known as:  CUBICIN Inject 500 mg into the vein every other day. Indication:  Discitis/osteomyelitis  First Dose: Yes Last Day of Therapy:  08/09/22  Labs - Once weekly:  CBC/D, BMP, and CPK Labs - Every other week:  ESR and CRP Method of administration: IV Push Method of administration may be changed at the discretion of home infusion pharmacist based upon assessment of the patient and/or caregiver's ability to self-administer the medication ordered.   diazepam 5 MG tablet Commonly known as: VALIUM Take 1 tablet (5 mg total) by mouth every 12 (twelve) hours as needed for anxiety. What changed: when to take this   DULoxetine 60 MG capsule Commonly known as: CYMBALTA Take 1 capsule (60 mg total) by mouth daily.   ferrous sulfate 325 (65 FE) MG tablet Take 1 tablet (325 mg total) by mouth 2 (two) times daily with a meal.   folic acid 1 MG tablet Commonly known as: FOLVITE Take 2 tablets by mouth once daily   FreeStyle Libre 14 Day Reader Kerrin Mo 1 Device by Does not apply route daily.   FreeStyle Libre 14 Day Sensor Misc 1 Device by Does not apply route daily.   furosemide 20 MG tablet Commonly known as: LASIX Take 2 tablets (40 mg total) by mouth daily.   HYDROcodone-acetaminophen 5-325 MG tablet Commonly known as: NORCO/VICODIN Take 1 tablet by mouth every 6 (six) hours as needed for moderate pain.   Lantus SoloStar 100 UNIT/ML Solostar Pen Generic drug: insulin glargine INJECT 8 UNITS SUBCUTANEOUSLY once a day What changed: additional instructions   lubiprostone 24 MCG capsule Commonly known as: AMITIZA Take 1 capsule (24 mcg total) by mouth 2 (two) times daily with a meal. What changed:  when to take this reasons to take this   montelukast 10 MG tablet Commonly known as: SINGULAIR Take 1 tablet (10 mg total) by mouth at bedtime.   multivitamin with minerals Tabs tablet Take 1 tablet by mouth daily.   NONFORMULARY OR COMPOUNDED ITEM Power wheelchair  #1  as directed    ondansetron 8 MG tablet Commonly known as: ZOFRAN Take 0.5 tablets (4 mg total) by mouth every 8 (eight) hours as needed for nausea or vomiting.   pregabalin 25 MG capsule Commonly known as: LYRICA Take 1 capsule (25 mg total) by mouth 3 (three) times daily. What changed:  medication strength how much to take   sodium bicarbonate 650 MG tablet Take 1 tablet (650 mg total) by mouth 2 (two) times daily.   tamsulosin 0.4 MG Caps capsule Commonly known as: FLOMAX Take 1 capsule (0.4 mg total) by mouth daily.   VITAMIN D PO Take 5,000 Units by mouth daily.               Discharge Care Instructions  (From admission, onward)           Start  Ordered   07/11/22 0000  Change dressing on IV access line weekly and PRN  (Home infusion instructions - [facility] )        07/11/22 0749            Follow-up Information     [facility].   Specialty: Home Health Services Why: Home Health arranged. They will contact you within 48 hours. Contact information: [address] [place] [address] [phone]                Allergies  Allergen Reactions   Bee Pollen Anaphylaxis   Broccoli [Brassica Oleracea] Anaphylaxis and Hives   Fish-Derived Products Anaphylaxis and Hives   Mushroom Extract Complex Anaphylaxis   Penicillins Anaphylaxis    **Tolerated cefepime March 2021 Did it involve swelling of the face/tongue/throat, SOB, or low BP? Yes Did it involve sudden or severe rash/hives, skin peeling, or any reaction on the inside of your mouth or nose? No Did you need to seek medical attention at a hospital or doctor's office? Yes When did it last happen?  A few months ago If all above answers are "NO", may proceed with cephalosporin use.   Rosemary Oil Anaphylaxis   Shellfish Allergy Anaphylaxis and Hives   Tomato Anaphylaxis and Hives    Raw tomatoes only, can tolerate if cooked   Acetaminophen Other (See  Comments)    GI upset   Aloe Vera Hives   Acyclovir And Related Other (See Comments)    Unknown reaction   Naproxen Hives    Consultations: Nephrology Neurosurgery   Procedures/Studies: IR Fluoro Guide CV Line Left  Result Date: 07/05/2022 INDICATION: 66 year old female referred for tunneled central venous catheter EXAM: IMAGE GUIDED PLACEMENT OF TUNNELED CENTRAL VENOUS CATHETER MEDICATIONS: None ANESTHESIA/SEDATION: None FLUOROSCOPY: Radiation Exposure Index (as provided by the fluoroscopic device): 5 mGy Kerma COMPLICATIONS: None PROCEDURE: Informed written consent was obtained from the patient after a thorough discussion of the procedural risks, benefits and alternatives. All questions were addressed. Maximal Sterile Barrier Technique was utilized including caps, mask, sterile gowns, sterile gloves, sterile drape, hand hygiene and skin antiseptic. A timeout was performed prior to the initiation of the procedure. Patient was placed in the supine position on angiographic table. Patency of the left internal jugular vein was confirmed with ultrasound with image documentation. We elected to use the left chest, as the patient has an open wound on the right anterior chest wall. Patient was prepped and draped in the usual sterile fashion including the left neck and superior chest. Using ultrasound guidance, the skin and subcutaneous tissues overlying the vein were generously infiltrated with 1% lidocaine without epinephrine. Using ultrasound guidance, the left jugular vein was punctured with a micropuncture needle, and an 018 wire was advanced into the right heart confirming venous access. A small stab incision was made with an 11 blade scalpel. Peel-away sheath was placed over the wire, and then the wire was removed, marking the wire for estimation of internal catheter length. The chest wall was then generously infiltrated with 1% lidocaine for local anesthesia along the tissue tract. Small stab incision  was made with 11 blade scalpel, and then the catheter was back tunneled to the puncture site. Catheter was pulled through the tract, with the catheter amputated at 25 cm. Catheter was advanced through the peel-away sheath, and the peel-away sheath was removed. Final image was stored. The catheter was anchored to the chest wall with 2 retention sutures,  and Derma bond was used to seal the jugular vein puncture site and at the chest wall. Patient tolerated the procedure well and remained hemodynamically stable throughout. No complications were encountered and no significant blood loss was encountered. IMPRESSION: Status post image guided placement of left internal jugular vein tunneled cuffed central venous catheter. Signed, Dulcy Fanny. Nadene Rubins, RPVI [facility] Electronically Signed   By: Corrie Mckusick D.O.   On: 07/05/2022 13:58   IR US Guide Vasc Access Left  Result Date: 07/05/2022 INDICATION: 66 year old female referred for tunneled central venous catheter EXAM: IMAGE GUIDED PLACEMENT OF TUNNELED CENTRAL VENOUS CATHETER MEDICATIONS: None ANESTHESIA/SEDATION: None FLUOROSCOPY: Radiation Exposure Index (as provided by the fluoroscopic device): 5 mGy Kerma COMPLICATIONS: None PROCEDURE: Informed written consent was obtained from the patient after a thorough discussion of the procedural risks, benefits and alternatives. All questions were addressed. Maximal Sterile Barrier Technique was utilized including caps, mask, sterile gowns, sterile gloves, sterile drape, hand hygiene and skin antiseptic. A timeout was performed prior to the initiation of the procedure. Patient was placed in the supine position on angiographic table. Patency of the left internal jugular vein was confirmed with ultrasound with image documentation. We elected to use the left chest, as the patient has an open wound on the right anterior chest wall. Patient was prepped and draped  in the usual sterile fashion including the left neck and superior chest. Using ultrasound guidance, the skin and subcutaneous tissues overlying the vein were generously infiltrated with 1% lidocaine without epinephrine. Using ultrasound guidance, the left jugular vein was punctured with a micropuncture needle, and an 018 wire was advanced into the right heart confirming venous access. A small stab incision was made with an 11 blade scalpel. Peel-away sheath was placed over the wire, and then the wire was removed, marking the wire for estimation of internal catheter length. The chest wall was then generously infiltrated with 1% lidocaine for local anesthesia along the tissue tract. Small stab incision was made with 11 blade scalpel, and then the catheter was back tunneled to the puncture site. Catheter was pulled through the tract, with the catheter amputated at 25 cm. Catheter was advanced through the peel-away sheath, and the peel-away sheath was removed. Final image was stored. The catheter was anchored to the chest wall with 2 retention sutures, and Derma bond was used to seal the jugular vein puncture site and at the chest wall. Patient tolerated the procedure well and remained hemodynamically stable throughout. No complications were encountered and no significant blood loss was encountered. IMPRESSION: Status post image guided placement of left internal jugular vein tunneled cuffed central venous catheter. Signed, Dulcy Fanny. Nadene Rubins, RPVI [facility] Electronically Signed   By: Corrie Mckusick D.O.   On: 07/05/2022 13:58   DG Lumbar Spine 2-3 Views  Result Date: 06/28/2022 CLINICAL DATA:  Revision of posterior thoracic/lumbar spine. EXAM: LUMBAR SPINE - 2-3 VIEW COMPARISON:  MRI thoracic spine 06/22/2022. MRI lumbar spine 06/21/2022. FINDINGS: Intraoperative thoracolumbar spine. One low resolution intraoperative spot views of the thoracolumbar  spine were obtained. No fracture visible on the limited views. Lower thoracic and upper lumbar fusion hardware is visualized. Total fluoroscopy time: 3.2 Total radiation dose: Not recorded IMPRESSION: Intraoperative thoracolumbar spine. Electronically Signed   By: Ronney Asters M.D.   On: 06/28/2022 19:35   DG O-ARM IMAGE ONLY/NO REPORT  Result Date: 06/28/2022 There is no Radiologist interpretation  for  this exam.  DG C-Arm 1-60 Min-No Report  Result Date: 06/28/2022 Fluoroscopy was utilized by the requesting physician.  No radiographic interpretation.   DG C-Arm 1-60 Min-No Report  Result Date: 06/28/2022 Fluoroscopy was utilized by the requesting physician.  No radiographic interpretation.   US RENAL  Result Date: 06/23/2022 CLINICAL DATA:  Acute renal failure. EXAM: RENAL / URINARY TRACT ULTRASOUND COMPLETE COMPARISON:  None Available. FINDINGS: Right Kidney: Renal measurements: 9.6 x 4.4 x 4.9 cm = volume: 109 mL. Echogenicity within normal limits. No mass or hydronephrosis visualized. Left Kidney: Renal measurements: 11.6 x 4.3 x 4.4 cm = volume: 120 mL. Echogenicity within normal limits. No mass or hydronephrosis visualized. Bladder: Under distended and not well evaluated. Other: None. IMPRESSION: Normal appearance of the kidneys. Electronically Signed   By: Ronney Asters M.D.   On: 06/23/2022 18:29   IR LUMBAR DISC ASPIRATION W/IMG GUIDE  Result Date: 06/23/2022 INDICATION: Low back pain, MR demonstrates Discitis and osteomyelitis at T10-11. There is endplate erosion at N39-76 due to infection. There is diffuse epidural thickening andenhancement from T8 through T11 compatible with infection. No epidural abscess. There is moderate spinal stenosis at T10-11. EXAM: THORACIC T10-11 DISC ASPIRATION UNDER FLUOROSCOPY MEDICATIONS: Lidocaine 1% subcutaneous ANESTHESIA/SEDATION: Intravenous Fentanyl 29mg and Versed 29mwere administered as conscious sedation during continuous monitoring of the  patient's level of consciousness and physiological / cardiorespiratory status by the radiology RN, with a total moderate sedation time of 10 minutes. PROCEDURE: Informed written consent was obtained from the patient after a thorough discussion of the procedural risks, benefits and alternatives. All questions were addressed. Maximal Sterile Barrier Technique was utilized including caps, mask, sterile gowns, sterile gloves, sterile drape, hand hygiene and skin antiseptic. A timeout was performed prior to the initiation of the procedure. The appropriate interspace was identified under fluoroscopy, corresponding to previous cross-sectional imaging. An appropriate skin entry site was determined. After local infiltration with 1% lidocaine, an 18 gauge trocar needle was advanced into the interspace from right posterolateral extraforaminal approach. Needle tip position within the interspace confirmed on biplane images. Approximately 2 mL thin serosanguineous fluid were aspirated, sent for the requested laboratory studies. The patient tolerated the procedure well. FLUOROSCOPY TIME:  Radiation Exposure Index (as provided by the fluoroscopic device): 54 mGy air Kerma COMPLICATIONS: None immediate. IMPRESSION: Technically successful thoracic T10-11 disc aspiration under fluoroscopy. Electronically Signed   By: D Lucrezia Europe.D.   On: 06/23/2022 15:48   MR THORACIC SPINE W WO CONTRAST  Result Date: 06/22/2022 CLINICAL DATA:  Suspect thoracic vertebral osteomyelitis. EXAM: MRI THORACIC WITHOUT AND WITH CONTRAST TECHNIQUE: Multiplanar and multiecho pulse sequences of the thoracic spine were obtained without and with intravenous contrast. CONTRAST:  7.5m92mADAVIST GADOBUTROL 1 MMOL/ML IV SOLN COMPARISON:  MRI thoracic spine 02/12/2020. Lumbar MRI 05/13/2022 and 06/21/2022 FINDINGS: Alignment:  Normal Vertebrae: Abnormal bone marrow edema and enhancement at T4 and T5. Intervening disc space is maintained without significant disc  space narrowing or hyperintensity. This is most likely discitis and osteomyelitis. There is paraspinous soft tissue thickening and enhancement on the right side also supportive of infection. No epidural abscess. Abnormal bone marrow edema and enhancement T10 and T11 vertebral bodies. There is prominent hyperintensity in the disc space at T10-11 compatible with discitis. There is erosion of the endplates at T10B34-19ere is bilateral paraspinous soft tissue thickening and enhancement also supportive of infection. There is epidural thickening and enhancement centered at T10-11 but extending superiorly to T8 compatible with phlegmon and infection.  No epidural abscess. No paraspinous soft tissue abscess. There is mild bone marrow edema and enhancement at T9 on the left which likely is also infection. Cord: Moderate spinal stenosis with cord deformity at T10-11. Possible mild cord edema at this level. Paraspinal and other soft tissues: Paraspinous soft tissue thickening enhancement on the right at T3-4. Bilateral paraspinous soft tissue thickening enhancement T10-11 compatible with infection. Disc levels: Osteomyelitis at T4-5.  No significant stenosis. Small central disc protrusion T5-6 without stenosis. Moderate spinal stenosis at T10-11 due to discitis and osteomyelitis with epidural soft tissue thickening as well as degenerative change. Pedicle screw fusion posteriorly beginning at T11 and extending into the lumbar spine. The T11 screws appear displaced into the T10-11 disc space. IMPRESSION: Early discitis and osteomyelitis at T4-5. No epidural abscess or stenosis. Right paraspinous soft tissue enhancement due to infection. Discitis and osteomyelitis at T10-11. There is enhancement at T9 on the left which likely is also infection. There is endplate erosion at G62-69 due to infection. There is diffuse epidural thickening and enhancement from T8 through T11 compatible with infection. No epidural abscess. There is  moderate spinal stenosis at T10-11. Compared with the prior MRI of the lumbar spine 05/13/2021, there appears to be progressive endplate destruction and progressive fluid in the disc space at T10-11. Electronically Signed   By: Franchot Gallo M.D.   On: 06/22/2022 13:36   ECHOCARDIOGRAM COMPLETE  Result Date: 06/21/2022    ECHOCARDIOGRAM REPORT   Patient Name:   LETHA MIRABAL Date of Exam: 06/21/2022 Medical Rec #:  [id number]         Height:       61.0 in Accession #:    [id number]        Weight:       160.0 lb Date of Birth:  08/21/1956         BSA:          1.718 m Patient Age:    31 years          BP:           126/59 mmHg Patient Gender: F                 HR:           77 bpm. Exam Location:  Inpatient Procedure: 2D Echo, Cardiac Doppler and Color Doppler Indications:    Osteomyelitis  History:        Patient has prior history of Echocardiogram examinations, most                 recent 01/07/2022. CAD, Signs/Symptoms:Shortness of Breath; Risk                 Factors:Diabetes, Hypertension and Dyslipidemia.  Sonographer:    Memory Argue Referring Phys: [place]  1. Poor acoustic windows limit study.  2. Left ventricular ejection fraction, by estimation, is 65 to 70%. The left ventricle has normal function. The left ventricle has no regional wall motion abnormalities. There is moderate asymmetric left ventricular hypertrophy.  3. Right ventricular systolic function is normal. The right ventricular size is normal.  4. Trivial mitral valve regurgitation.  5. The aortic valve was not well visualized. Aortic valve regurgitation is not visualized. Comparison(s): The left ventricular function is unchanged. FINDINGS  Left Ventricle: Left ventricular ejection fraction, by estimation, is 65 to 70%. The left ventricle has normal function. The left ventricle has no regional wall motion abnormalities. The left ventricular internal  cavity size was normal in size. There is  moderate asymmetric left  ventricular hypertrophy. Right Ventricle: The right ventricular size is normal. Right vetricular wall thickness was not assessed. Right ventricular systolic function is normal. Left Atrium: Left atrial size was normal in size. Right Atrium: Right atrial size was normal in size. Pericardium: There is no evidence of pericardial effusion. Mitral Valve: There is mild thickening of the mitral valve leaflet(s). Mild mitral annular calcification. Trivial mitral valve regurgitation. Tricuspid Valve: The tricuspid valve is not well visualized. Aortic Valve: The aortic valve was not well visualized. Aortic valve regurgitation is not visualized. Aortic valve mean gradient measures 4.0 mmHg. Aortic valve peak gradient measures 6.4 mmHg. Aortic valve area, by VTI measures 2.53 cm. Pulmonic Valve: The pulmonic valve was not well visualized. Aorta: The aortic root and ascending aorta are structurally normal, with no evidence of dilitation. IAS/Shunts: The interatrial septum was not assessed.  LEFT VENTRICLE PLAX 2D LVIDd:         4.80 cm LVIDs:         3.13 cm LV PW:         1.30 cm LV IVS:        1.60 cm LVOT diam:     2.30 cm LV SV:         73 LV SV Index:   43 LVOT Area:     4.15 cm  RIGHT VENTRICLE TAPSE (M-mode): 2.2 cm LEFT ATRIUM             Index LA diam:        4.70 cm 2.74 cm/m LA Vol (A2C):   36.4 ml 21.19 ml/m LA Vol (A4C):   53.9 ml 31.38 ml/m LA Biplane Vol: 43.2 ml 25.15 ml/m  AORTIC VALVE AV Area (Vmax):    2.15 cm AV Area (Vmean):   2.09 cm AV Area (VTI):     2.53 cm AV Vmax:           126.00 cm/s AV Vmean:          98.400 cm/s AV VTI:            0.289 m AV Peak Grad:      6.4 mmHg AV Mean Grad:      4.0 mmHg LVOT Vmax:         65.30 cm/s LVOT Vmean:        49.500 cm/s LVOT VTI:          0.176 m LVOT/AV VTI ratio: 0.61  AORTA Ao Root diam: 3.40 cm MITRAL VALVE MV Area (PHT): 2.87 cm    SHUNTS MV Decel Time: 264 msec    Systemic VTI:  0.18 m MV E velocity: 67.70 cm/s  Systemic Diam: 2.30 cm MV A velocity:  89.50 cm/s MV E/A ratio:  0.76 MV A Prime:    5.9 cm/s Dorris Carnes MD Electronically signed by Dorris Carnes MD Signature Date/Time: 06/21/2022/12:10:32 PM    Final    MR Lumbar Spine W Wo Contrast  Result Date: 06/21/2022 CLINICAL DATA:  Low back pain, infection suspected, positive xray/CT EXAM: MRI LUMBAR SPINE WITHOUT AND WITH CONTRAST TECHNIQUE: Multiplanar and multiecho pulse sequences of the lumbar spine were obtained without and with intravenous contrast. CONTRAST:  7.84m GADAVIST GADOBUTROL 1 MMOL/ML IV SOLN COMPARISON:  MRI of the lumbar spine 05/13/2022. FINDINGS: Segmentation: Standard segmentation is assumed. Same numbering as on prior. Alignment:  No substantial sagittal subluxation. Vertebrae: Prior T11 through L3 posterior fusion. Incompletely imaged edema within  the T11 vertebral body and surrounding paraspinous musculature, which remains concerning for discitis/osteomyelitis. This may be mildly improved relative to 05/13/2022 but is incompletely characterized (the T10 vertebral body and T10-T11 disc space are not imaged and there is poor visualization of the canal at this level). Conus medullaris and cauda equina: Conus and cauda equina are partially obscured by metallic artifact. Paraspinal and other soft tissues: Incompletely imaged paraspinous edema in the lower thoracic spine. The extent of edema is probably improved from the prior. Disc levels: Unchanged multilevel superimposed degenerative change, as characterized on prior MRI. IMPRESSION: Incompletely imaged edema within the T11 vertebral body and surrounding paraspinous musculature, which remains concerning for discitis/osteomyelitis. This may be mildly improved relative to 05/13/2022 but is incompletely characterized (the T10 vertebral body and T10-T11 disc space are not imaged and there is poor visualization of the canal at these levels). Recommend MRI of the thoracic spine with contrast for further characterization. Electronically Signed    By: Margaretha Sheffield M.D.   On: 06/21/2022 12:04   CT ABDOMEN PELVIS WO CONTRAST  Result Date: 06/20/2022 CLINICAL DATA:  Acute abdominal pain.  Nausea and vomiting. EXAM: CT ABDOMEN AND PELVIS WITHOUT CONTRAST TECHNIQUE: Multidetector CT imaging of the abdomen and pelvis was performed following the standard protocol without IV contrast. RADIATION DOSE REDUCTION: This exam was performed according to the departmental dose-optimization program which includes automated exposure control, adjustment of the mA and/or kV according to patient size and/or use of iterative reconstruction technique. COMPARISON:  Most recent CT 03/25/2022.  MRI 05/13/2022 FINDINGS: Lower chest: No acute airspace disease, no pleural effusion. Hepatobiliary: Calcified granuloma in the liver. Nodular hepatic contours. No evidence of focal liver lesion on this unenhanced exam. Clips in the gallbladder fossa postcholecystectomy. No biliary dilatation. Pancreas: No ductal dilatation or inflammation. Spleen: Upper normal in size spanning 12.2 x 5.5 x 12.7 cm (volume = 450 cm^3). Calcified granuloma. Adrenals/Urinary Tract: No adrenal nodule. Chronic perinephric edema. No hydronephrosis. Calcification in the upper right kidney may be nonobstructing stone or vascular. No evidence of focal renal abnormality on this unenhanced exam. Unremarkable urinary bladder. Stomach/Bowel: Decompressed stomach. There is a small duodenal diverticulum. No bowel obstruction or inflammation. Normal appendix. Small to moderate colonic stool burden. There is sigmoid colonic redundancy. Vascular/Lymphatic: Moderate aortic atherosclerosis. No aortic aneurysm. Smooth aortic contours. Small retroperitoneal lymph nodes, not enlarged by size criteria. Reproductive: Hysterectomy.  No adnexal mass. Other: No ascites or free air. Small fat containing umbilical hernia. Mild rectus diastasis. Musculoskeletal: T10-T11 disc space irregularity, lucency, and paravertebral soft tissue  thickening. There is paravertebral soft tissue edema. The superior pedicle screws extend into the region of disc abnormality. This is at site of prior MRI findings consistent with discitis osteomyelitis. Prior L1 corpectomy. IMPRESSION: 1. No acute abnormality in the abdomen/pelvis. 2. T10-T11 disc space irregularity, lucency, and paravertebral soft tissue thickening. 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 This may represent persistent or residual infection, although the degree of soft tissue edema is suspicious for on going infection. The superior pedicle screws extend into the region of disc abnormality. 3. Cirrhotic hepatic morphology.  Borderline splenomegaly. 4. Nonobstructing right renal stone versus vascular calcification. Aortic Atherosclerosis (ICD10-I70.0). Electronically Signed   By: Keith Rake M.D.   On: 06/20/2022 17:49   (Echo, Carotid, EGD, Colonoscopy, ERCP)    Subjective: No complaints  Discharge Exam: Vitals:   07/11/22 0025 07/11/22 0558  BP: (!) 144/66 (!) 181/68  Pulse: (!) 55 63  Resp:  20  Temp: 98.2 F (36.8 C) 98.2 F (36.8 C)  SpO2: 98% 96%   Vitals:   07/10/22 1938 07/10/22 1954 07/11/22 0025 07/11/22 0558  BP: (!) 152/68  (!) 144/66 (!) 181/68  Pulse: 60  (!) 55 63  Resp: 16   20  Temp:   98.2 F (36.8 C) 98.2 F (36.8 C)  TempSrc:   Oral   SpO2: 97% 97% 98% 96%  Weight:      Height:        General: Pt is alert, awake, not in acute distress Cardiovascular: RRR, S1/S2 +, no rubs, no gallops Respiratory: CTA bilaterally, no wheezing, no rhonchi Abdominal: Soft, NT, ND, bowel sounds + Extremities: no edema, no cyanosis    The results of significant diagnostics from this hospitalization (including imaging, microbiology, ancillary and laboratory) are listed below for reference.     Microbiology: No results found for this or any previous visit (from the past 240 hour(s)).   Labs: BNP (last 3 results) Recent Labs     10/21/21 0226 12/01/21 1626 01/06/22 2338  BNP 1,149.3* 860.9* 9,562.1*   Basic Metabolic Panel: Recent Labs  Lab 07/07/22 0400 07/08/22 0400 07/09/22 1518 07/10/22 0515 07/11/22 0606  NA 139 138 141 139 140  K 4.5 4.3 4.7 4.5 4.3  CL 107 105 104 104 108  CO2 26 26 28 26 26   GLUCOSE 72 73 123* 89 99  BUN 42* 38* 36* 37* 32*  CREATININE 4.03* 4.13* 4.03* 4.11* 4.07*  CALCIUM 8.7* 8.8* 8.8* 8.8* 8.7*   Liver Function Tests: No results for input(s): "AST", "ALT", "ALKPHOS", "BILITOT", "PROT", "ALBUMIN" in the last 168 hours. No results for input(s): "LIPASE", "AMYLASE" in the last 168 hours. No results for input(s): "AMMONIA" in the last 168 hours. CBC: Recent Labs  Lab 07/05/22 0452 07/07/22 0400 07/10/22 0515  WBC 5.6 4.7 5.2  HGB 8.4* 7.9* 8.0*  HCT 26.3* 25.6* 26.2*  MCV 88.9 90.5 90.3  PLT 155 131* 124*   Cardiac Enzymes: Recent Labs  Lab 07/08/22 0400  CKTOTAL 29*   BNP: Invalid input(s): "POCBNP" CBG: Recent Labs  Lab 07/10/22 1126 07/10/22 1611 07/10/22 2054 07/10/22 2330 07/11/22 0406  GLUCAP 91 116* 118* 118* 82   D-Dimer No results for input(s): "DDIMER" in the last 72 hours. Hgb A1c No results for input(s): "HGBA1C" in the last 72 hours. Lipid Profile No results for input(s): "CHOL", "HDL", "LDLCALC", "TRIG", "CHOLHDL", "LDLDIRECT" in the last 72 hours. Thyroid function studies No results for input(s): "TSH", "T4TOTAL", "T3FREE", "THYROIDAB" in the last 72 hours.  Invalid input(s): "FREET3" Anemia work up No results for input(s): "VITAMINB12", "FOLATE", "FERRITIN", "TIBC", "IRON", "RETICCTPCT" in the last 72 hours. Urinalysis    Component Value Date/Time   COLORURINE YELLOW 06/20/2022 1900   APPEARANCEUR CLOUDY (A) 06/20/2022 1900   LABSPEC 1.013 06/20/2022 1900   PHURINE 5.0 06/20/2022 1900   GLUCOSEU 50 (A) 06/20/2022 1900   GLUCOSEU 500 (A) 12/03/2019 1309   HGBUR MODERATE (A) 06/20/2022 1900   HGBUR moderate 08/05/2010 1048    BILIRUBINUR NEGATIVE 06/20/2022 1900   BILIRUBINUR neg 11/15/2017 1241   KETONESUR NEGATIVE 06/20/2022 1900   PROTEINUR 100 (A) 06/20/2022 1900   UROBILINOGEN >=8.0 (A) 12/03/2019 1309   NITRITE NEGATIVE 06/20/2022 1900   LEUKOCYTESUR LARGE (A) 06/20/2022 1900   Sepsis Labs Recent Labs  Lab 07/05/22 0452 07/07/22 0400 07/10/22 0515  WBC 5.6 4.7 5.2   Microbiology No results found for this or any previous visit (from  the past 240 hour(s)).   Time coordinating discharge: Over 30 minutes  SIGNED:   Charlynne Cousins, MD  [facility] 07/11/2022, 8:15 AM Pager   If 7PM-7AM, please contact night-coverage [email or web address] Password [email or web address]

## 2022-07-11 NOTE — Progress Notes (Signed)
Physical Therapy Treatment Patient Details Name: [name] [name] MRN: [id number] DOB: 17-May-1956 Today's Date: 07/11/2022   History of Present Illness Pt is a 66 y.o. female presenting 8/30 s/p posterior exploration/revision of posteriuor thoracic 11- Lumbar 3, extension of fusion T9-11 with vertebral augmentation. PMH significant for spinal surgery, chronic diastolic heart failure, CKD stage IV, renal transplant, chronic DVTs, DMII, and blindness.    PT Comments    Pt upset about being d/c'd today however continues to refuse to go to rehab. Pt with improved transfer ability and ambulation tolerance this date. Per case management spouse has set up caregivers to provide 24/7 care once he returns to work. Acute PT to cont to follow.    Recommendations for follow up therapy are one component of a multi-disciplinary discharge planning process, led by the attending physician.  Recommendations may be updated based on patient status, additional functional criteria and insurance authorization.  Follow Up Recommendations  Home health PT Can patient physically be transported by private vehicle: Yes   Assistance Recommended at Discharge Frequent or constant Supervision/Assistance  Patient can return home with the following A lot of help with bathing/dressing/bathroom;Assistance with cooking/housework;Assist for transportation;Help with stairs or ramp for entrance;A lot of help with walking and/or transfers   Equipment Recommendations  Other (comment)    Recommendations for Other Services       Precautions / Restrictions Precautions Precautions: Fall;Back Precaution Booklet Issued: No Precaution Comments: Reviewed precautions during functional mobility. Pt with low vision. Required Braces or Orthoses: Spinal Brace Spinal Brace: Thoracolumbosacral orthotic;Applied in supine position (order for sitting but patient prefers supine) Restrictions Weight Bearing Restrictions: No Other  Position/Activity Restrictions: no bending, lifting, twisting     Mobility  Bed Mobility Overal bed mobility: Needs Assistance Bed Mobility: Rolling, Sidelying to Sit Rolling: Min guard Sidelying to sit: Min assist       General bed mobility comments: rolling for brace application in supine with A for hips, to sit up assist for lifting trunk patient able to assist more today.    Transfers Overall transfer level: Needs assistance Equipment used: Rolling walker (2 wheels) (youth height) Transfers: Sit to/from Stand Sit to Stand: Min assist           General transfer comment: max directional verbal cues for safe hand placement    Ambulation/Gait Ambulation/Gait assistance: +2 safety/equipment, Min assist Gait Distance (Feet): 50 Feet Assistive device: Rolling walker (2 wheels) Gait Pattern/deviations: Step-to pattern, Step-through pattern, Decreased stride length, Leaning posteriorly       General Gait Details: pt with improved ambulation tolerance, continues with short step height and length in addition to narrow base of support   Stairs             Wheelchair Mobility    Modified Rankin (Stroke Patients Only)       Balance Overall balance assessment: Needs assistance Sitting-balance support: Bilateral upper extremity supported, Single extremity supported Sitting balance-Leahy Scale: Fair Sitting balance - Comments: initially reliant on UE support, once relaxed pt closer to min guard Postural control: Posterior lean Standing balance support: Bilateral upper extremity supported, Reliant on assistive device for balance Standing balance-Leahy Scale: Poor Standing balance comment: UE support for balance                            Cognition Arousal/Alertness: Awake/alert Behavior During Therapy: Anxious (requires control of session, anxious about d/c home today) Overall Cognitive Status: No family/caregiver present to  determine baseline  cognitive functioning Area of Impairment: Following commands, Safety/judgement, Problem solving                   Current Attention Level: Sustained   Following Commands: Follows one step commands consistently, Follows one step commands with increased time Safety/Judgement: Decreased awareness of safety   Problem Solving: Slow processing General Comments: pt likes to be in control however as long as she has her pain meds 1 hr before and dons her TLSO in supine pt compliant, cooperative, and pleasant        Exercises      General Comments General comments (skin integrity, edema, etc.): VSS      Pertinent Vitals/Pain Pain Assessment Pain Assessment: Faces Faces Pain Scale: Hurts even more Pain Location: R side Pain Descriptors / Indicators: Aching, Guarding, Grimacing, Discomfort    Home Living                          Prior Function            PT Goals (current goals can now be found in the care plan section) Acute Rehab PT Goals Patient Stated Goal: Be able to go home PT Goal Formulation: With patient Time For Goal Achievement: 07/13/22 Potential to Achieve Goals: Fair Progress towards PT goals: Progressing toward goals    Frequency    Min 5X/week      PT Plan Current plan remains appropriate    Co-evaluation              AM-PAC PT "6 Clicks" Mobility   Outcome Measure  Help needed turning from your back to your side while in a flat bed without using bedrails?: A Little Help needed moving from lying on your back to sitting on the side of a flat bed without using bedrails?: A Little Help needed moving to and from a bed to a chair (including a wheelchair)?: A Little Help needed standing up from a chair using your arms (e.g., wheelchair or bedside chair)?: A Little Help needed to walk in hospital room?: A Little Help needed climbing 3-5 steps with a railing? : A Lot 6 Click Score: 17    End of Session Equipment Utilized During  Treatment: Gait belt;Back brace Activity Tolerance: Patient tolerated treatment well Patient left: in chair;with call bell/phone within reach;with chair alarm set Nurse Communication: Mobility status PT Visit Diagnosis: Other abnormalities of gait and mobility (R26.89);Difficulty in walking, not elsewhere classified (R26.2);Pain;Muscle weakness (generalized) (M62.81) Pain - Right/Left: Right Pain - part of body:  (R flank)     Time: 9480-1655 PT Time Calculation (min) (ACUTE ONLY): 28 min  Charges:  $Gait Training: 8-22 mins $Therapeutic Activity: 8-22 mins                     [name] [name], PT, DPT Acute Rehabilitation Services Secure chat preferred Office #: [phone]    [name] [name] 07/11/2022, 1:39 PM

## 2022-07-11 NOTE — TOC Transition Note (Signed)
Transition of Care [facility]) - CM/SW Discharge Note   Patient Details  Name: [name] [name] MRN: [id number] Date of Birth: 11-11-1955  Transition of Care [facility]) CM/SW Contact:  Pollie Friar, RN Phone Number: 07/11/2022, 11:55 AM   Clinical Narrative:    Patient is discharging home today. She is set up with [facility] for [facility] services. Cory with [facility] aware of d/c home. Pam with [facility] also aware of discharge and will have the IV abx to the home for the next dose on Thursday.  Pt has needed DME at home.  Spouse is arranging caregivers for home and will stay home with her for a couple days until this is arranged.  Spouse to provide transport home today.   Final next level of care: [place] Barriers to Discharge: No Barriers Identified   Patient Goals and CMS Choice Patient states their goals for this hospitalization and ongoing recovery are:: return home CMS Medicare.gov Compare Post Acute Care list provided to:: Patient Choice offered to / list presented to : Patient  Discharge Placement                       Discharge Plan and Services   Discharge Planning Services: CM Consult Post Acute Care Choice: Home Health                    HH Arranged: RN, PT, OT, Nurse's Aide, Social Work [employer] Agency: [place] Date [facility] Agency Contacted: 06/29/22 Time [place]: 78 Representative spoke with at [place]: [place] ([place]) Interventions     Readmission Risk Interventions    06/23/2022    2:13 PM 01/23/2020    2:59 PM 11/18/2019   10:54 AM  Readmission Risk Prevention Plan  Transportation Screening Complete Complete Complete  PCP or Specialist Appt within 3-5 Days Complete    HRI or [place] Complete    Social Work Consult for [place] Planning/Counseling Complete    Palliative Care Screening Not Applicable    Medication Review Press photographer) Complete Complete Referral to Pharmacy   PCP or Specialist appointment within 3-5 days of discharge  Complete Complete  HRI or [place]  Complete Complete  SW Recovery Care/Counseling Consult  Complete Complete  Palliative Care Screening  Complete Not [place]  Complete Not Applicable

## 2022-07-11 NOTE — Telephone Encounter
Pt calls that she has not been feeling well for the past couple of weeks. States she has been very light headed and feels like she is "stumbling around". Thought it was due to her BP and meds. Her BP is fine and today readings were 120/73 prior to going to the gym and 130/75 after the gym.   States that she checked her blood glucose and it was 208 after being at the gym.  States she has been taking the actos and  amaryl as directed. Is concerned that her glucose is this high. She has appt scheduled for Nov but wonders if she needs to do something sooner than that.    Suggested pt take her BS every am fasting for now as well.

## 2022-07-11 NOTE — Telephone Encounter
I don't think her sugars are likely to blame, or her BP.  Is she drinking enough water?

## 2022-07-11 NOTE — Telephone Encounter
Pt notified. Will call back next week with BS readings.

## 2022-07-11 NOTE — Progress Notes (Signed)
Mobility Specialist: Progress Note   07/11/22 1618  Mobility  Activity Ambulated with assistance to bathroom  Level of Assistance Minimal assist, patient does 75% or more  Assistive Device Front wheel walker  Distance Ambulated (ft) 20 ft  Activity Response Tolerated well  $Mobility charge 1 Mobility   Pt assisted to the BR. LOB x1 requiring minA to correct when turning in BR. Pt assisted back to bed with call bell in reach.   [facility] Leiby Pigeon Mobility Specialist Mobility Specialist [address]: [phone]

## 2022-07-12 ENCOUNTER — Telehealth: Payer: Self-pay

## 2022-07-12 NOTE — Telephone Encounter (Signed)
Transition Care Management Unsuccessful Follow-up Telephone Call  Date of discharge and from where:  [facility] 07-11-22 Dx: osteomyelitis  Attempts:  1st Attempt  Reason for unsuccessful TCM follow-up call:  Unable to reach patient

## 2022-07-13 ENCOUNTER — Encounter: Admit: 2022-07-13 | Discharge: 2022-07-13 | Payer: MEDICARE

## 2022-07-13 NOTE — Telephone Encounter (Signed)
Transition Care Management Unsuccessful Follow-up Telephone Call  Date of discharge and from where:  [name] [name] 07-11-22 Dx: osteomyelitis  Attempts:  2nd Attempt  Reason for unsuccessful TCM follow-up call:  Unable to reach patient

## 2022-07-13 NOTE — Telephone Encounter
I called and spoke to the patient about her episodes of dizziness and lightheadedness. She thinks symptoms may be related to her medications. I discussed with her to keep a log of her blood pressure/blood glucose/ symptoms. I will see her in clinic next week and review the log. I discussed with her not to drive when she has these symptoms and to report to the ED if she developed severe symptoms , severe hypertension or severe hyperglycemia.    Julious Payer, MD

## 2022-07-14 ENCOUNTER — Telehealth: Payer: Self-pay | Admitting: Family Medicine

## 2022-07-14 ENCOUNTER — Encounter: Admit: 2022-07-14 | Discharge: 2022-07-14 | Payer: MEDICARE

## 2022-07-14 NOTE — Telephone Encounter (Signed)
Just an FYI

## 2022-07-14 NOTE — Telephone Encounter
Pt calls to report that she is inpatient at [name] at this time.

## 2022-07-14 NOTE — Telephone Encounter (Signed)
Kenney Houseman, RN with [facility], called to provide update for Dr. Etter Sjogren after visit with patient.   Patient has back pain 7 out of 10.   In the hospital, doctor told her to use folic acid and singulair. Patient said these were discontinued so she is no longer taking them.   Her carvedilol interacts with albuterol and advair.   Patient is taken her diabetes medication but not taken her blood sugar.   This info will be faxed. If you have questions or need to discuss, call her at [phone]

## 2022-07-14 NOTE — Telephone Encounter (Signed)
Transition Care Management Follow-up Telephone Call Date of discharge and from where: 07/11/2022, [facility], Osteomyelitis How have you been since you were released from the hospital? "I'm doing okay" Any questions or concerns? No  Items Reviewed: Did the pt receive and understand the discharge instructions provided? Yes  Medications obtained and verified? Yes  Other? No  Any new allergies since your discharge? No  Dietary orders reviewed? Yes Do you have support at home? Yes   Home Care and Equipment/Supplies: Were home health services ordered? yes If so, what is the name of the agency? [facility]  Has the agency set up a time to come to the patient's home? yes Were any new equipment or medical supplies ordered?  No What is the name of the medical supply agency? NA Were you able to get the supplies/equipment? not applicable Do you have any questions related to the use of the equipment or supplies? No  Functional Questionnaire: (I = Independent and D = Dependent) ADLs: I, with help from husband.  Bathing/Dressing- I, with help from husband.  Meal Prep- I, with help from husband.  Eating- I, with help from husband.  Maintaining continence- I  Transferring/Ambulation- I  Managing Meds- I  Follow up appointments reviewed:  PCP Hospital f/u appt confirmed? Yes  Scheduled to see Mackie Pai, PA on 07/21/2022 @ 1:40p. [facility] f/u appt confirmed?  Pt to call Monday  Scheduled to see Dr. Marcello Moores. Are transportation arrangements needed? No  If their condition worsens, is the pt aware to call PCP or go to the Emergency Dept.? Yes Was the patient provided with contact information for the PCP's office or ED? Yes Was to pt encouraged to call back with questions or concerns? Yes

## 2022-07-17 ENCOUNTER — Encounter: Admit: 2022-07-17 | Discharge: 2022-07-17 | Payer: MEDICARE

## 2022-07-17 DIAGNOSIS — R5383 Other fatigue: Secondary | ICD-10-CM

## 2022-07-17 MED ORDER — ONDANSETRON HCL 4 MG PO TAB
4 mg | ORAL_TABLET | ORAL | 0 refills | 8.00000 days | Status: AC | PRN
Start: 2022-07-17 — End: ?

## 2022-07-17 NOTE — Telephone Encounter
I will send some [facility]. If the nausea and vomiting continues we may need to switch her to a different abx.

## 2022-07-17 NOTE — Telephone Encounter
Pt calls that she was at [facility] from [place] to Sat,  UTI, hypertension, high BS.    Was started on hydralazine '25mg'$  TID and Cefdinir '300mg'$  BID.  Had vomiting this morning. BP 161/93 this am. BS 199.    States she really doesn't feel much better and thinks she may have more kidney damage. Hosp F/U appt scheduled.  Records in [place].

## 2022-07-17 NOTE — Telephone Encounter
Call to pt. She is taking the cefdinir with food but does get nauseated and has had some vomiting. Would like to get order for zofran if possible[facility] [address]  Has [facility]osp F/U on Wed.

## 2022-07-17 NOTE — Telephone Encounter
Hospital Discharge Follow Up      Reached Patient: Yes, patient was identified, for their safety, using dual identification of name and date of birth  Patient Date of Birth: 04-17-1956     Admission Information:     Hospital Name: [facility]  Admission Date: 07/13/22    Discharge Date: 07/15/22    Admission Diagnosis: UTI  Discharge Diagnosis: UTI  Has there been a discharge within the last 30 days? No   Hospital Services: Unplanned  Today's call is 1 (business) days post discharge      Discharge Instruction Review   Did patient receive and understand discharge instructions? Yes    Home Health ordered? No                 Agency name/telephone number: n/a   Has Home Health agency contacted patient? No   Caregiver assistance in the home? No   Are there concerns regarding the patient's ADL'S? No  Is patient a fall risk? No    Special diet? No      Medication Reconciliation    Changes to pre-hospital medications? Yes, per phone notes     +Hydralazine  +Cefdinir    Were new prescriptions filled? Yes  Meds reviewed and reconciled? Yes. [facility] [address] Has taken Zofran in the past.     Current Outpatient Medications   Medication Instructions   ? ACCU-CHEK AVIVA PLUS TEST STRP test strip TEST EVERY DAY BEFORE BREAKFAST AS DIRECTED   ? ACCU-CHEK GUIDE GLUCOSE METER kit USE AS DIRECTED TO CHECK BLOOD SUGARS   ? albuterol sulfate (PROAIR HFA) 90 mcg/actuation HFA aerosol inhaler 2 puffs, Inhalation, EVERY  6 HOURS PRN   ? amLODIPine (NORVASC) 10 mg tablet TAKE 1 TABLET EVERY DAY   ? azelastine (ASTELIN) 137 mcg (0.1 %) nasal spray 2 sprays, Each Nostril, TWICE DAILY   ? betamethasone valerate (VALISONE) 0.1 % topical cream APPLY TOPICALLY TO AFFECTED AREA DAILY   ? Blood Glucose Control, Normal soln Use in glucose meter, Accu-check Aviva   ? budesonide-formoterol HFA (SYMBICORT) 160-4.5 mcg/actuation aerosol inhaler 2 puffs, Inhalation, TWICE DAILY   ? carvediloL (COREG) 25 mg, Oral, TWICE DAILY WITH MEALS, Take with food.   ? cefdinir (OMNICEF) 300 mg capsule No dose, route, or frequency recorded.   ? cetirizine (ZYRTEC) 10 mg, Oral, DAILY,     ? CHOLEcalciferoL (vitamin D3) (VITAMIN D3) 2,000 Units, Oral, DAILY   ? cloNIDine HCL (CATAPRES) 0.1 mg tablet Take 0.2mg  in the morning and 0.1mg  in the evening   ? cyanocobalamin (vitamin B-12) 500 mcg, Oral, DAILY   ? cyclobenzaprine (FLEXERIL) 5 mg tablet TAKE 1 TABLET AT BEDTIME   ? DROPSAFE ALCOHOL PREP PADS USE TO CLEAN SKIN BEFORE FINGERSTICK   ? ezetimibe (ZETIA) 10 mg, Oral, DAILY   ? famotidine (PEPCID) 20 mg tablet TAKE 1 TABLET TWICE DAILY   ? ferrous sulfate (FEOSOL) 325 mg, Oral, EVERY 48 HOURS   ? fluticasone propionate (FLONASE) 50 mcg/actuation nasal spray, suspension USE 2 SPRAYS IN EACH NOSTRIL AS DIRECTED DAILY. SHAKE BOTTLE GENTLY BEFORE USING   ? glimepiride (AMARYL) 3 mg, Oral, EVERY MORNING   ? hydrALAZINE (APRESOLINE) 25 mg tablet No dose, route, or frequency recorded.   ? irbesartan (AVAPRO) 150 mg, Oral, AT BEDTIME DAILY   ? lancets MISC 1 each, Test, DAILY BEFORE BREAKFAST, Diag: E11.29   ? pantoprazole DR (PROTONIX) 40 mg tablet TAKE 1 TABLET EVERY  DAY   ? pioglitazone (ACTOS) 30 mg, Oral, DAILY         Understanding Condition   Having any current symptoms? Yes, Nausea and Vomiting, believes this is due to her antibiotic.  Nausea improved with taking it with food for her second dose, but it still caused her to vomit. Discussed that taking it with food is the best option and PCP will be asked for some nausea medication for her. She is able to tolerate water intake. Having some abdominal discomfort since her vomiting started.  BP was high this AM but has improved, most recent was 145/90. Has not checked her sugar since the initial reading of 199 this AM.   Denies fever, diarrhea, urinary symptoms, or any other symptoms of concern at this time.     Do you have a history of Heart Failure? No    Patient understands when to seek additional medical care? Yes   Other items discussed: f/u      Scheduling Follow-up Appointment   Upcoming appointments:   Future Appointments   Date Time Provider Department Center   07/19/2022 10:30 AM Margarite Gouge [facility]   07/21/2022  8:40 AM Doran Durand, MD [facility]   08/31/2022 11:30 AM Shela Leff, MD [facility]   11/15/2022  9:20 AM Doran Durand, MD [facility]     Does the patient require 7 day follow up appointment? Yes   Hospital Follow-Up scheduled with PCP? Yes, Date: 07/19/22   When was patient?s last PCP visit: 04/21/2022   PCP primary location: [facility]  Specialist appointment scheduled? Yes, with Nephrology 07/21/22  Is assistance with transportation needed? No   MyChart message sent? Active in MyChart. No message sent.   [facility] text sent? No    Staci Acosta, RN

## 2022-07-17 NOTE — Telephone Encounter
Pt notified

## 2022-07-19 ENCOUNTER — Telehealth: Payer: Self-pay | Admitting: Family Medicine

## 2022-07-19 ENCOUNTER — Encounter (HOSPITAL_COMMUNITY): Payer: Self-pay | Admitting: Neurosurgery

## 2022-07-19 ENCOUNTER — Encounter: Admit: 2022-07-19 | Discharge: 2022-07-19 | Payer: MEDICARE

## 2022-07-19 ENCOUNTER — Ambulatory Visit: Admit: 2022-07-19 | Discharge: 2022-07-20 | Payer: MEDICARE

## 2022-07-19 DIAGNOSIS — E559 Vitamin D deficiency, unspecified: Secondary | ICD-10-CM

## 2022-07-19 DIAGNOSIS — J309 Allergic rhinitis, unspecified: Secondary | ICD-10-CM

## 2022-07-19 DIAGNOSIS — M79642 Pain in left hand: Secondary | ICD-10-CM

## 2022-07-19 DIAGNOSIS — R110 Nausea: Secondary | ICD-10-CM

## 2022-07-19 DIAGNOSIS — K2970 Gastritis, unspecified, without bleeding: Secondary | ICD-10-CM

## 2022-07-19 DIAGNOSIS — J45909 Unspecified asthma, uncomplicated: Secondary | ICD-10-CM

## 2022-07-19 DIAGNOSIS — G729 Myopathy, unspecified: Secondary | ICD-10-CM

## 2022-07-19 DIAGNOSIS — K5900 Constipation, unspecified: Secondary | ICD-10-CM

## 2022-07-19 DIAGNOSIS — E785 Hyperlipidemia, unspecified: Secondary | ICD-10-CM

## 2022-07-19 DIAGNOSIS — K589 Irritable bowel syndrome without diarrhea: Secondary | ICD-10-CM

## 2022-07-19 DIAGNOSIS — I10 Essential (primary) hypertension: Secondary | ICD-10-CM

## 2022-07-19 DIAGNOSIS — G5600 Carpal tunnel syndrome, unspecified upper limb: Secondary | ICD-10-CM

## 2022-07-19 DIAGNOSIS — D649 Anemia, unspecified: Secondary | ICD-10-CM

## 2022-07-19 DIAGNOSIS — G9050 Complex regional pain syndrome I, unspecified: Secondary | ICD-10-CM

## 2022-07-19 DIAGNOSIS — N1832 Stage 3b chronic kidney disease (HCC): Secondary | ICD-10-CM

## 2022-07-19 DIAGNOSIS — Z09 Encounter for follow-up examination after completed treatment for conditions other than malignant neoplasm: Secondary | ICD-10-CM

## 2022-07-19 DIAGNOSIS — N3000 Acute cystitis without hematuria: Secondary | ICD-10-CM

## 2022-07-19 DIAGNOSIS — E119 Type 2 diabetes mellitus without complications: Secondary | ICD-10-CM

## 2022-07-19 DIAGNOSIS — E1129 Type 2 diabetes mellitus with other diabetic kidney complication: Secondary | ICD-10-CM

## 2022-07-19 LAB — BASIC METABOLIC PANEL
CHLORIDE: 102 MMOL/L — ABNORMAL LOW (ref 98–110)
CO2: 23 MMOL/L — ABNORMAL HIGH (ref 21–30)
GLUCOSE,PANEL: 336 mg/dL — ABNORMAL HIGH (ref 70–100)

## 2022-07-19 MED ORDER — ALBUTEROL SULFATE 90 MCG/ACTUATION IN HFAA
2 | RESPIRATORY_TRACT | 3 refills | Status: AC | PRN
Start: 2022-07-19 — End: ?

## 2022-07-19 NOTE — Telephone Encounter (Signed)
Caller/Agency: Ubaldo Glassing [facility] OT) Callback Number: [phone] Requesting OT/PT/Skilled Nursing/Social Work/Speech Therapy: OT Frequency: 1 w 4

## 2022-07-19 NOTE — Telephone Encounter (Signed)
Alexis [facility] OT) called to get clarification on pt restriction post-op.

## 2022-07-20 DIAGNOSIS — R809 Proteinuria, unspecified: Secondary | ICD-10-CM

## 2022-07-20 DIAGNOSIS — I10 Essential (primary) hypertension: Secondary | ICD-10-CM

## 2022-07-20 NOTE — Telephone Encounter (Signed)
FYI. Verbal given

## 2022-07-21 ENCOUNTER — Ambulatory Visit (INDEPENDENT_AMBULATORY_CARE_PROVIDER_SITE_OTHER): Payer: PRIVATE HEALTH INSURANCE | Admitting: Medical

## 2022-07-21 ENCOUNTER — Encounter: Payer: Self-pay | Admitting: Medical

## 2022-07-21 ENCOUNTER — Ambulatory Visit: Admit: 2022-07-21 | Discharge: 2022-07-22 | Payer: MEDICARE

## 2022-07-21 ENCOUNTER — Encounter: Admit: 2022-07-21 | Discharge: 2022-07-21 | Payer: MEDICARE

## 2022-07-21 VITALS — BP 138/59 | HR 62 | Temp 98.0°F | Resp 18 | Ht 59.0 in

## 2022-07-21 DIAGNOSIS — M4644 Discitis, unspecified, thoracic region: Secondary | ICD-10-CM

## 2022-07-21 DIAGNOSIS — Z87440 Personal history of urinary (tract) infections: Secondary | ICD-10-CM | POA: Diagnosis not present

## 2022-07-21 DIAGNOSIS — R1011 Right upper quadrant pain: Secondary | ICD-10-CM | POA: Diagnosis not present

## 2022-07-21 DIAGNOSIS — G894 Chronic pain syndrome: Secondary | ICD-10-CM | POA: Diagnosis not present

## 2022-07-21 DIAGNOSIS — R197 Diarrhea, unspecified: Secondary | ICD-10-CM

## 2022-07-21 DIAGNOSIS — Z23 Encounter for immunization: Secondary | ICD-10-CM

## 2022-07-21 DIAGNOSIS — Z7185 Encounter for immunization safety counseling: Secondary | ICD-10-CM

## 2022-07-21 DIAGNOSIS — E785 Hyperlipidemia, unspecified: Secondary | ICD-10-CM

## 2022-07-21 DIAGNOSIS — J309 Allergic rhinitis, unspecified: Secondary | ICD-10-CM

## 2022-07-21 DIAGNOSIS — E119 Type 2 diabetes mellitus without complications: Secondary | ICD-10-CM

## 2022-07-21 DIAGNOSIS — I10 Essential (primary) hypertension: Secondary | ICD-10-CM

## 2022-07-21 DIAGNOSIS — E559 Vitamin D deficiency, unspecified: Secondary | ICD-10-CM

## 2022-07-21 DIAGNOSIS — G5600 Carpal tunnel syndrome, unspecified upper limb: Secondary | ICD-10-CM

## 2022-07-21 DIAGNOSIS — D649 Anemia, unspecified: Secondary | ICD-10-CM

## 2022-07-21 DIAGNOSIS — J45909 Unspecified asthma, uncomplicated: Secondary | ICD-10-CM

## 2022-07-21 DIAGNOSIS — G9050 Complex regional pain syndrome I, unspecified: Secondary | ICD-10-CM

## 2022-07-21 DIAGNOSIS — K2970 Gastritis, unspecified, without bleeding: Secondary | ICD-10-CM

## 2022-07-21 DIAGNOSIS — K5900 Constipation, unspecified: Secondary | ICD-10-CM

## 2022-07-21 DIAGNOSIS — K589 Irritable bowel syndrome without diarrhea: Secondary | ICD-10-CM

## 2022-07-21 DIAGNOSIS — M79642 Pain in left hand: Secondary | ICD-10-CM

## 2022-07-21 MED ORDER — IRBESARTAN 150 MG PO TAB
300 mg | ORAL_TABLET | Freq: Every evening | ORAL | 3 refills | Status: AC
Start: 2022-07-21 — End: ?
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
## 2022-07-21 NOTE — Addendum Note (Signed)
Addended by: Jeronimo Greaves on: 07/21/2022 02:47 PM   Modules accepted: Orders

## 2022-07-21 NOTE — Patient Instructions (Addendum)
  Principal Problem:  Osteomyelitis ([place]). 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 Will follow thru with surgeon office recommendation of abd [name] to see if any complication from surgery.  For diarrhea will get stool studies. Among these will be c dif study. Stay hydrate and bland foods. After collecting studies can use immodium otc.  For discitis follow up with surgeon office and ID early next week.  Ask you also schedule appointment with your pcp in 7-10 days or sooner if needed.  Pcv 20 vaccine and flu today.

## 2022-07-21 NOTE — Progress Notes (Signed)
Subjective:    [name], adult    DOB: September 24, 1956, 66 y.o.   MRN: [id number]  HPI Pin for hospital follow up.   Admit date: 06/20/2022 Discharge date: 07/11/2022   Recommendations for Outpatient Follow-up:  Follow up with PCP in 1-2 weeks Please obtain BMP/CBC in one week Please follow-up with neurology in 2 to 4 weeks.   Brief/Interim Summary: 66 y.o. 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 Neurosurgery, ID and interventional radiology were consulted. Patient underwent IR aspiration on 06/23/2022, that grew staph epi on cultures.  Started on IV daptomycin.  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 Nephrology was consulted who started her on IV Lasix and she diuresed. Her creatinine remained stable at 4. The patient is not a renal transplant as she relates. Physical therapy evaluated the patient recommended skilled nursing facility, which the patient  refused she was sent home with home health.   T10/T11 discitis vertebral osteomyelitis: CT scan on admission showed discitis and osteomyelitis of T10-T11. The surgery was consulted and patient underwent posterior arthrodesis T8-T11 and removal of T11 pedicles screws and rod removal. Disc aspiration grew staph epi found susceptible to daptomycin. IR was consulted and tunneled catheter was placed with nephrology's agreement. ID was consulted and recommended IV daptomycin until 09/18/2022.   Diabetes mellitus type 2: 100s admit to her medication continue current regimen.   Metabolic acidosis: Continue bicarbonate tablets.   Chronic DVT: At home she was on Eliquis it was held on 06/18/2022. Underwent surgical intervention was started on Lovenox and then on transition home she was sent on low-dose Eliquis.   Chronic diastolic heart failure: Appears euvolemic, nephrology recommended to continue Lasix and follow-up with them in 4 to 4 weeks.   Anxiety/depression: Continue Cymbalta and Valium.   Hyperkalemia: Resolved with Kayexalate.    Pt has neurologist  follow up on Monday.   Follow up with on Tuesday.    Review of Systems  Constitutional:  Negative for chills, fatigue and fever.  Respiratory:  Negative for cough, chest tightness, wheezing and stridor.   Cardiovascular:  Negative for chest pain and palpitations.  Gastrointestinal:  Positive for abdominal pain and diarrhea. Negative for nausea.       Rt flank pain. She is pointing to area above rt iliac superior spine and rt lower rib area pain.  Pt states she discuss pain with surgeon office and np or nurse thought maybe [name] indicated.   Watery diarrhea for 6 days. Pt is on  anibiotics.  Genitourinary:  Positive for flank pain. Negative for frequency, pelvic pain and scrotal swelling.  Musculoskeletal:  Negative for back pain.  Neurological:  Negative for dizziness and headaches.  Psychiatric/Behavioral:  Negative for  sleep disturbance. The patient is not nervous/anxious.          Physical Exam  General- No acute distress. Pleasant patient. Neck- Full range of motion, no jvd Lungs- Clear, even and unlabored. Heart- regular rate and rhythm. Neurologic- CNII- XII grossly intact.  Abdomen-mild rt side abdomen tenderness/flank area. No bruising seen.  Back- no cva tenderness.  Lower ext- no pedal edema. Calfs symmetric.     Assessment & Plan:   Patient Instructions   Principal Problem:  Osteomyelitis ([place]). Post discectomy,Essential hypertension hx of UTI (urinary tract infection)AKI (acute kidney injury) ([place]) and DM ( type 2 with renal complications ([place]))   Since dc some rt side abdomen pain. Will follow thru with surgeon office recommendation of abd [name] to see if any complication from surgery.  For diarrhea will get stool studies. Among these will be c dif study. Stay hydrate and bland foods. After collecting studies can use immodium otc.  For discitis follow up with surgeon office and ID early next week.  Ask you also schedule appointment with your pcp in 7-10 days or sooner if needed.       Mackie Pai, PA-C    Time spent with patient today was  42 minutes which consisted of chart review, discussing diagnosis, work up, treatment, answering all questions, vaccine counseling and documentation.

## 2022-07-22 DIAGNOSIS — N1832 Chronic kidney disease, stage 3b (HCC): Secondary | ICD-10-CM

## 2022-07-24 ENCOUNTER — Encounter: Admit: 2022-07-24 | Discharge: 2022-07-24 | Payer: MEDICARE

## 2022-07-24 DIAGNOSIS — R350 Frequency of micturition: Secondary | ICD-10-CM

## 2022-07-24 DIAGNOSIS — R300 Dysuria: Secondary | ICD-10-CM

## 2022-07-24 MED ORDER — PIOGLITAZONE 45 MG PO TAB
45 mg | ORAL_TABLET | Freq: Every day | ORAL | 3 refills | 90.00000 days | Status: AC
Start: 2022-07-24 — End: ?

## 2022-07-24 MED ORDER — FLUCONAZOLE 150 MG PO TAB
150 mg | ORAL_TABLET | Freq: Once | ORAL | 0 refills | 3.00000 days | Status: AC
Start: 2022-07-24 — End: ?

## 2022-07-24 NOTE — Telephone Encounter
Pt calls that she has finished the cefdinir but now thinks she has a vaginal yeast infection. Has some discharge and burning and itching.  Also states she feels like she still has some urgency and frequency.    Would like to get some diflucan, also do you recommend another UA?

## 2022-07-25 ENCOUNTER — Encounter: Admit: 2022-07-25 | Discharge: 2022-07-25 | Payer: MEDICARE

## 2022-07-25 ENCOUNTER — Ambulatory Visit: Admit: 2022-07-25 | Discharge: 2022-07-25 | Payer: MEDICARE

## 2022-07-25 DIAGNOSIS — N1832 Stage 3b chronic kidney disease (HCC): Secondary | ICD-10-CM

## 2022-07-25 DIAGNOSIS — R300 Dysuria: Secondary | ICD-10-CM

## 2022-07-25 DIAGNOSIS — E559 Vitamin D deficiency, unspecified: Secondary | ICD-10-CM

## 2022-07-25 DIAGNOSIS — R350 Frequency of micturition: Secondary | ICD-10-CM

## 2022-07-25 LAB — CBC AND DIFF
HEMATOCRIT: 32 % — ABNORMAL LOW (ref 36–45)
MCH: 27 pg (ref 26–34)
MCV: 82 FL (ref 80–100)
RBC COUNT: 3.9 M/UL — ABNORMAL LOW (ref 4.0–5.0)
WBC COUNT: 9 K/UL (ref 4.5–11.0)

## 2022-07-25 LAB — COMPREHENSIVE METABOLIC PANEL
ALBUMIN: 4.1 g/dL (ref 3.5–5.0)
ALK PHOSPHATASE: 82 U/L (ref 25–110)
ALT: 12 U/L (ref 7–56)
ANION GAP: 8 (ref 3–12)
AST: 13 U/L (ref 7–40)
BLD UREA NITROGEN: 23 mg/dL (ref 7–25)
CALCIUM: 9.8 mg/dL (ref 8.5–10.6)
CHLORIDE: 104 MMOL/L (ref 98–110)
CO2: 25 MMOL/L (ref 21–30)
CREATININE: 1.6 mg/dL — ABNORMAL HIGH (ref 0.4–1.00)
EGFR: 35 mL/min — ABNORMAL LOW (ref >60)
GLUCOSE,PANEL: 178 mg/dL — ABNORMAL HIGH (ref 70–100)
POTASSIUM: 4.8 MMOL/L — ABNORMAL HIGH (ref 3.5–5.1)
SODIUM: 137 MMOL/L (ref 137–147)
TOTAL BILIRUBIN: 0.5 mg/dL (ref 0.3–1.2)
TOTAL PROTEIN: 7.4 g/dL (ref 6.0–8.0)

## 2022-07-25 LAB — URINALYSIS DIPSTICK
NITRITE: NEGATIVE
URINE ASCORBIC ACID, UA: NEGATIVE
URINE BILE: NEGATIVE
URINE BLOOD: NEGATIVE

## 2022-07-25 LAB — URINALYSIS, MICROSCOPIC

## 2022-07-25 LAB — PHOSPHORUS: PHOSPHORUS: 3.1 mg/dL — ABNORMAL LOW (ref 2.0–4.5)

## 2022-07-25 LAB — PARATHYROID HORMONE: PTH HORMONE: 130 pg/mL — ABNORMAL HIGH (ref 10–65)

## 2022-07-26 ENCOUNTER — Inpatient Hospital Stay: Payer: PRIVATE HEALTH INSURANCE

## 2022-07-26 ENCOUNTER — Telehealth: Payer: Self-pay | Admitting: *Deleted

## 2022-07-26 ENCOUNTER — Inpatient Hospital Stay (HOSPITAL_BASED_OUTPATIENT_CLINIC_OR_DEPARTMENT_OTHER): Payer: PRIVATE HEALTH INSURANCE | Admitting: Family

## 2022-07-26 ENCOUNTER — Encounter: Payer: Self-pay | Admitting: Family

## 2022-07-26 ENCOUNTER — Encounter: Admit: 2022-07-26 | Discharge: 2022-07-26 | Payer: MEDICARE

## 2022-07-26 VITALS — BP 127/52 | HR 53 | Temp 97.7°F | Resp 18

## 2022-07-26 DIAGNOSIS — Z7901 Long term (current) use of anticoagulants: Secondary | ICD-10-CM | POA: Diagnosis not present

## 2022-07-26 DIAGNOSIS — Z86718 Personal history of other venous thrombosis and embolism: Secondary | ICD-10-CM | POA: Diagnosis not present

## 2022-07-26 DIAGNOSIS — D500 Iron deficiency anemia secondary to blood loss (chronic): Secondary | ICD-10-CM | POA: Diagnosis not present

## 2022-07-26 DIAGNOSIS — D631 Anemia in chronic kidney disease: Secondary | ICD-10-CM

## 2022-07-26 DIAGNOSIS — D649 Anemia, unspecified: Secondary | ICD-10-CM

## 2022-07-26 DIAGNOSIS — N183 Chronic kidney disease, stage 3 unspecified: Secondary | ICD-10-CM | POA: Diagnosis not present

## 2022-07-26 DIAGNOSIS — D508 Other iron deficiency anemias: Secondary | ICD-10-CM

## 2022-07-26 DIAGNOSIS — N1832 Stage 3b chronic kidney disease (HCC): Secondary | ICD-10-CM

## 2022-07-26 LAB — CBC WITH DIFFERENTIAL (CANCER CENTER ONLY)
Abs Immature Granulocytes: 0.18 10*3/uL — ABNORMAL HIGH (ref 0.00–0.07)
Basophils Absolute: 0.1 10*3/uL (ref 0.0–0.1)
Basophils Relative: 1 %
Eosinophils Absolute: 0.3 10*3/uL (ref 0.0–0.5)
Eosinophils Relative: 5 %
HCT: 26.3 % — ABNORMAL LOW (ref 36.0–46.0)
Hemoglobin: 8 g/dL — ABNORMAL LOW (ref 12.0–15.0)
Immature Granulocytes: 3 %
Lymphocytes Relative: 12 %
Lymphs Abs: 0.8 10*3/uL (ref 0.7–4.0)
MCH: 27.6 pg (ref 26.0–34.0)
MCHC: 30.4 g/dL (ref 30.0–36.0)
MCV: 90.7 fL (ref 80.0–100.0)
Monocytes Absolute: 0.6 10*3/uL (ref 0.1–1.0)
Monocytes Relative: 9 %
Neutro Abs: 4.6 10*3/uL (ref 1.7–7.7)
Neutrophils Relative %: 70 %
Platelet Count: 123 10*3/uL — ABNORMAL LOW (ref 150–400)
RBC: 2.9 MIL/uL — ABNORMAL LOW (ref 3.87–5.11)
RDW: 15.2 % (ref 11.5–15.5)
WBC Count: 6.5 10*3/uL (ref 4.0–10.5)
nRBC: 0 % (ref 0.0–0.2)

## 2022-07-26 LAB — RETICULOCYTES
Immature Retic Fract: 15.4 % (ref 2.3–15.9)
RBC.: 2.86 MIL/uL — ABNORMAL LOW (ref 3.87–5.11)
Retic Count, Absolute: 63.8 10*3/uL (ref 19.0–186.0)
Retic Ct Pct: 2.2 % (ref 0.4–3.1)

## 2022-07-26 LAB — CMP (CANCER CENTER ONLY)
ALT: 10 U/L (ref 0–44)
AST: 14 U/L — ABNORMAL LOW (ref 15–41)
Albumin: 3.5 g/dL (ref 3.5–5.0)
Alkaline Phosphatase: 101 U/L (ref 38–126)
Anion gap: 10 (ref 5–15)
BUN: 56 mg/dL — ABNORMAL HIGH (ref 8–23)
CO2: 24 mmol/L (ref 22–32)
Calcium: 8.9 mg/dL (ref 8.9–10.3)
Chloride: 103 mmol/L (ref 98–111)
Creatinine: 5.38 mg/dL (ref 0.44–1.00)
GFR, Estimated: 8 mL/min — ABNORMAL LOW (ref >60)
Glucose, Bld: 107 mg/dL — ABNORMAL HIGH (ref 70–99)
Potassium: 4.6 mmol/L (ref 3.5–5.1)
Sodium: 137 mmol/L (ref 135–145)
Total Bilirubin: 0.4 mg/dL (ref 0.3–1.2)
Total Protein: 8.5 g/dL — ABNORMAL HIGH (ref 6.5–8.1)

## 2022-07-26 LAB — FERRITIN: Ferritin: 394 ng/mL — ABNORMAL HIGH (ref 11–307)

## 2022-07-26 LAB — SAMPLE TO BLOOD BANK

## 2022-07-26 MED ORDER — EPOETIN ALFA-EPBX 40000 UNIT/ML IJ SOLN
40000.0000 [IU] | Freq: Once | INTRAMUSCULAR | Status: AC
  Administered 2022-07-26: 40000 [IU] via SUBCUTANEOUS

## 2022-07-26 NOTE — Progress Notes (Signed)
Hematology and Oncology Follow Up Visit  [name] [name] [id number] 06/10/1956 66 y.o. 07/26/2022   Principle Diagnosis:  History of DVT of both the right and left peroneal and posterior tibial veins  Hyperhomocystinemia   Iron deficiency anemia  Anemia secondary to chronic kidney disease, stage III   Current Therapy: Eliquis 5 mg PO BID Folic acid 2 mg PO daily IV iron as indicated  Retacrit 40,000 units for Hgb < 11   Interim History:  [name] [name] is here today for follow-up and ESA injection. We last saw [name] [name] in July and unfortunately was unable to follow-up due to hospitalization with discitis at T10-T11. [name] [name] had posterior arthrodesis T8-T11 and removal of T11 pedicles screws and rod removal.  [name] [name] is currently in a back brace/stabilizer and states that [name] [name] is still having lower back pain. [name] [name] states that [name] [name] neurosurgeon plans to re-evaluate [name] [name] film from surgery and see if there is anything else that needs to be done.  [name] [name] if fatigued and weak. Hgb is 8.0, MCV 90, platelets 123 and WBC count 6.5.  [name] [name] is getting PT regularly. [name] [name] is also getting IV antibiotics every other day at home with home health.  [name] [name] notes mild SOB with exertion.  [name] [name] has been able to get up and walk some at home. [name] [name] is in [name] [name] motorized scooter today.  No fever, chills, n/v, cough, rash, dizziness, chest pain, palpitations, abdominal pain or changes in bowel or bladder habits.  Swelling in [name] [name] lower extremities is stable. Pedal pulses are 1+.  No falls or syncope reported.  Appetite and hydration have been fair. [name] [name] was unable to stand for weight today.   ECOG Performance Status: 1 - Symptomatic but completely ambulatory  Medications:  Allergies as of 07/26/2022       Reactions   Bee Pollen Anaphylaxis   Broccoli [brassica Oleracea] Anaphylaxis, Hives   Fish-derived Products Anaphylaxis, Hives   Mushroom Extract Complex Anaphylaxis   Penicillins Anaphylaxis   **Tolerated cefepime March 2021 Did it  involve swelling of the face/tongue/throat, SOB, or low BP? Yes Did it involve sudden or severe rash/hives, skin peeling, or any reaction on the inside of your mouth or nose? No Did you need to seek medical attention at a hospital or doctor's office? Yes When did it last happen?  A few months ago If all above answers are "NO", may proceed with cephalosporin use.   Rosemary Oil Anaphylaxis   Shellfish Allergy Anaphylaxis, Hives   Tomato Anaphylaxis, Hives   Raw tomatoes only, can tolerate if cooked   Acetaminophen Other (See Comments)   GI upset   Aloe Vera Hives   Acyclovir And Related Other (See Comments)   Unknown reaction   Naproxen Hives        Medication List        Accurate as of July 26, 2022  2:59 PM. If you have any questions, ask your nurse or doctor.          acetaminophen 325 MG tablet Commonly known as: TYLENOL Take 2 tablets (650 mg total) by mouth every 6 (six) hours as needed for mild pain, fever or headache (or Fever >/= 101). Discontinue 500/1000 mg dosing   Advair HFA 115-21 MCG/ACT inhaler Generic drug: fluticasone-salmeterol Inhale 2 puffs into the lungs 2 (two) times daily. What changed:  when to take this reasons to take this   albuterol (2.5 MG/3ML) 0.083% nebulizer solution Commonly known as: PROVENTIL Take 3 mLs (2.5 mg total) by nebulization every  6 (six) hours as needed for wheezing or shortness of breath.   albuterol 108 (90 Base) MCG/ACT inhaler Commonly known as: VENTOLIN HFA Inhale 2 puffs into the lungs every 6 (six) hours as needed for wheezing or shortness of breath.   amLODipine 5 MG tablet Commonly known as: NORVASC Take 1 tablet (5 mg total) by mouth daily.   apixaban 2.5 MG Tabs tablet Commonly known as: Eliquis Take 1 tablet (2.5 mg total) by mouth 2 (two) times daily.   atorvastatin 10 MG tablet Commonly known as: LIPITOR Take 1 tablet (10 mg total) by mouth daily.   carvedilol 12.5 MG tablet Commonly known as:  COREG Take 1 tablet (12.5 mg total) by mouth daily.   daptomycin  IVPB Commonly known as: CUBICIN Inject 500 mg into the vein every other day. Indication:  Discitis/osteomyelitis  First Dose: Yes Last Day of Therapy:  08/09/22  Labs - Once weekly:  CBC/D, BMP, and CPK Labs - Every other week:  ESR and CRP Method of administration: IV Push Method of administration may be changed at the discretion of home infusion pharmacist based upon assessment of the patient and/or caregiver's ability to self-administer the medication ordered.   diazepam 5 MG tablet Commonly known as: VALIUM Take 1 tablet (5 mg total) by mouth every 12 (twelve) hours as needed for anxiety. What changed: when to take this   DULoxetine 60 MG capsule Commonly known as: CYMBALTA Take 1 capsule (60 mg total) by mouth daily.   ferrous sulfate 325 (65 FE) MG tablet Take 1 tablet (325 mg total) by mouth 2 (two) times daily with a meal.   folic acid 1 MG tablet Commonly known as: FOLVITE Take 2 tablets by mouth once daily   FreeStyle Libre 14 Day Reader Kerrin Mo 1 Device by Does not apply route daily.   FreeStyle Libre 14 Day Sensor Misc 1 Device by Does not apply route daily.   furosemide 20 MG tablet Commonly known as: LASIX Take 2 tablets (40 mg total) by mouth daily.   HYDROcodone-acetaminophen 5-325 MG tablet Commonly known as: NORCO/VICODIN Take 1 tablet by mouth every 6 (six) hours as needed for moderate pain.   Lantus SoloStar 100 UNIT/ML Solostar Pen Generic drug: insulin glargine INJECT 8 UNITS SUBCUTANEOUSLY once a day   lubiprostone 24 MCG capsule Commonly known as: AMITIZA Take 1 capsule (24 mcg total) by mouth 2 (two) times daily with a meal. What changed:  when to take this reasons to take this   montelukast 10 MG tablet Commonly known as: SINGULAIR Take 1 tablet (10 mg total) by mouth at bedtime.   multivitamin with minerals Tabs tablet Take 1 tablet by mouth daily.   NONFORMULARY OR  COMPOUNDED ITEM Power wheelchair  #1  as directed   ondansetron 8 MG tablet Commonly known as: ZOFRAN Take 0.5 tablets (4 mg total) by mouth every 8 (eight) hours as needed for nausea or vomiting.   pregabalin 25 MG capsule Commonly known as: LYRICA Take 1 capsule (25 mg total) by mouth 3 (three) times daily.   sodium bicarbonate 650 MG tablet Take 1 tablet (650 mg total) by mouth 2 (two) times daily.   tamsulosin 0.4 MG Caps capsule Commonly known as: FLOMAX Take 1 capsule (0.4 mg total) by mouth daily.   VITAMIN D PO Take 5,000 Units by mouth daily.        Allergies:  Allergies  Allergen Reactions   Bee Pollen Anaphylaxis   Broccoli [Brassica Oleracea] Anaphylaxis and  Hives   Fish-Derived Products Anaphylaxis and Hives   Mushroom Extract Complex Anaphylaxis   Penicillins Anaphylaxis    **Tolerated cefepime March 2021 Did it involve swelling of the face/tongue/throat, SOB, or low BP? Yes Did it involve sudden or severe rash/hives, skin peeling, or any reaction on the inside of your mouth or nose? No Did you need to seek medical attention at a hospital or doctor's office? Yes When did it last happen?  A few months ago If all above answers are "NO", may proceed with cephalosporin use.   Rosemary Oil Anaphylaxis   Shellfish Allergy Anaphylaxis and Hives   Tomato Anaphylaxis and Hives    Raw tomatoes only, can tolerate if cooked   Acetaminophen Other (See Comments)    GI upset   Aloe Vera Hives   Acyclovir And Related Other (See Comments)    Unknown reaction   Naproxen Hives    Past Medical History, Surgical history, Social history, and Family History were reviewed and updated.  Review of Systems: All other 10 point review of systems is negative.   Physical Exam:  oral temperature is 97.7 F (36.5 C). [name] [name] blood pressure is 127/52 (abnormal) and [name] [name] pulse is 53 (abnormal). [name] [name] respiration is 18 and oxygen saturation is 96%.   Wt Readings from Last 3 Encounters:   07/08/22 194 lb 14.2 oz (88.4 kg)  03/25/22 160 lb (72.6 kg)  02/14/22 168 lb (76.2 kg)    Ocular: Sclerae unicteric, pupils equal, round and reactive to light Ear-nose-throat: Oropharynx clear, dentition fair Lymphatic: No cervical or supraclavicular adenopathy Lungs no rales or rhonchi, good excursion bilaterally Heart regular rate and rhythm, no murmur appreciated Abd soft, nontender, positive bowel sounds MSK no focal spinal tenderness, no joint edema Neuro: non-focal, well-oriented, appropriate affect Breasts: Deferred   Lab Results  Component Value Date   WBC 6.5 07/26/2022   HGB 8.0 (L) 07/26/2022   HCT 26.3 (L) 07/26/2022   MCV 90.7 07/26/2022   PLT 123 (L) 07/26/2022   Lab Results  Component Value Date   FERRITIN 251 05/24/2022   IRON 34 05/24/2022   TIBC 260 05/24/2022   UIBC 226 05/24/2022   IRONPCTSAT 13 05/24/2022   Lab Results  Component Value Date   RETICCTPCT 2.2 07/26/2022   RBC 2.90 (L) 07/26/2022   RBC 2.86 (L) 07/26/2022   No results found for: "KPAFRELGTCHN", "LAMBDASER", "KAPLAMBRATIO" No results found for: "IGGSERUM", "IGA", "IGMSERUM" No results found for: "TOTALPROTELP", "ALBUMINELP", "A1GS", "A2GS", "BETS", "BETA2SER", "GAMS", "MSPIKE", "SPEI"   Chemistry      Component Value Date/Time   NA 137 07/26/2022 1258   K 4.6 07/26/2022 1258   CL 103 07/26/2022 1258   CO2 24 07/26/2022 1258   BUN 56 (H) 07/26/2022 1258   CREATININE 5.38 (HH) 07/26/2022 1258   CREATININE 1.97 (H) 06/24/2020 1431      Component Value Date/Time   CALCIUM 8.9 07/26/2022 1258   ALKPHOS 101 07/26/2022 1258   AST 14 (L) 07/26/2022 1258   ALT 10 07/26/2022 1258   BILITOT 0.4 07/26/2022 1258       Impression and Plan: [name] [name] is a very pleasant 66 yo caucasian female with long standing history of iron deficiency anemia as well as history of DVT's in both the right and left peroneal and posterior tibial veins while [name] [name] was bed bound in the hospital being  treated for osteomyelitis.  [name] [name] had an elevated homocystine level, mildly elevated anticardiolipin antibody IgG level and elevated D-dimer as well  with lab work.  ESA given today for Hgb 8.0.  Iron studies are pending. We will replace if needed.  Lab check and injection every 3 weeks.  Follow-up in 6 weeks.   Lottie Dawson, NP 9/27/20232:59 PM

## 2022-07-26 NOTE — Telephone Encounter (Signed)
[place] NP notified of creat-5.38.  No new orders received at this time.

## 2022-07-26 NOTE — Patient Instructions (Signed)

## 2022-07-27 ENCOUNTER — Ambulatory Visit: Payer: PRIVATE HEALTH INSURANCE | Admitting: Internal Medicine

## 2022-07-27 LAB — IRON AND IRON BINDING CAPACITY (CC-WL,HP ONLY)
Iron: 64 ug/dL (ref 28–170)
Saturation Ratios: 28 % (ref 10.4–31.8)
TIBC: 232 ug/dL — ABNORMAL LOW (ref 250–450)
UIBC: 168 ug/dL (ref 148–442)

## 2022-08-01 ENCOUNTER — Encounter: Payer: Self-pay | Admitting: Internal Medicine

## 2022-08-01 ENCOUNTER — Other Ambulatory Visit: Payer: Self-pay

## 2022-08-01 ENCOUNTER — Ambulatory Visit (INDEPENDENT_AMBULATORY_CARE_PROVIDER_SITE_OTHER): Payer: PRIVATE HEALTH INSURANCE | Admitting: Internal Medicine

## 2022-08-01 DIAGNOSIS — M4625 Osteomyelitis of vertebra, thoracolumbar region: Secondary | ICD-10-CM

## 2022-08-01 MED ORDER — DOXYCYCLINE HYCLATE 100 MG PO TABS: 100.0000 mg | ORAL_TABLET | Freq: Two times a day (BID) | ORAL | 11 refills | Status: DC

## 2022-08-01 NOTE — Progress Notes (Signed)
[facility] for [facility]ious [facility]  Patient Active Problem List   Diagnosis Date Noted   Osteomyelitis of vertebra of thoracolumbar region ([place]) 01/21/2020    Priority: High   Osteomyelitis ([place]) 06/20/2022   Estrogen deficiency 05/25/2022   CKD (chronic kidney disease) stage 5, GFR less than 15 ml/min (HCC) 02/14/2022   Prolonged QT interval 01/07/2022   Streptococcal bacteremia    Osteomyelitis of left foot ([place]) 10/16/2021   Diabetic foot ulcers ([place]) 10/16/2021   DM (diabetes mellitus), type 2 with renal complications ([place]) 82/95/6213   Portal hypertension ([place]) 08/16/2021   Eczema of scalp 06/27/2021   Hypothyroidism 06/27/2021   Pruritus 06/27/2021   Seasonal allergic rhinitis due to pollen 02/02/2021   PTSD (post-traumatic stress disorder)    Osteoarthritis    MRSA (methicillin resistant Staphylococcus aureus)    Hypertension    High cholesterol    Hepatic steatosis    GERD (gastroesophageal reflux disease)    Fibromyalgia    Episode of visual loss of left eye    Depression    Constipation    Cataract    Bulging lumbar disc    Asthma    Anxiety    Anemia    Tinea capitis 10/11/2020   Tinea corporis 10/11/2020   Hip pain 05/16/2020   IDA (iron deficiency anemia) 05/04/2020   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 ([place]) 03/16/2020   S/P lumbar fusion 03/04/2020   Spinal stenosis of lumbar region 01/22/2020   Compression fracture of L1 lumbar vertebra ([place]) 11/17/2019   Fracture of multiple ribs 11/17/2019   AKI (acute kidney injury) ([place]) 11/15/2019   Nausea & vomiting 11/15/2019   Pressure injury of skin 04/17/2019   Septic arthritis of elbow, right ([place]) 04/17/2019   Retinopathy 04/17/2019   Renal transplant, 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   Sleep apnea 11/16/2017   Diabetic foot infection ([place]) 04/06/2017   Type 2 diabetes mellitus with hyperglycemia, with long-term current use of insulin ([place]) 04/06/2017   Neuropathy  05/05/2015   Diabetic peripheral neuropathy ([place]) 01/12/2015   CKD (chronic kidney disease), stage III ([place]) 05/27/2014   History of MI (myocardial infarction) 10/06/2013   Nausea and vomiting 10/06/2013   Obesity (BMI 30-39.9) 09/20/2013   Multinodular goiter 04/17/2013   Normocytic anemia 02/03/2013   Proximal humerus fracture 10/15/2012   CAD (coronary artery disease) 01/11/2012   UTI (urinary tract infection) 08/05/2010   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  Previous Medications   ACETAMINOPHEN (TYLENOL) 325 MG TABLET    Take 2 tablets (650 mg total) by mouth every 6 (six) hours as needed for mild pain, fever or headache (or Fever >/= 101). Discontinue 500/1000 mg dosing   ALBUTEROL (PROVENTIL) (2.5 MG/3ML) 0.083% NEBULIZER SOLUTION    Take 3 mLs (2.5 mg total) by nebulization every 6 (six) hours as needed for wheezing or shortness of breath.   ALBUTEROL (VENTOLIN HFA) 108 (90 BASE) MCG/ACT INHALER    Inhale 2 puffs into the lungs every 6 (six) hours as needed for wheezing or shortness of breath.   AMLODIPINE (NORVASC) 5 MG TABLET    Take 1 tablet (5 mg total) by mouth daily.   APIXABAN (  ELIQUIS) 2.5 MG TABS TABLET    Take 1 tablet (2.5 mg total) by mouth 2 (two) times daily.   ATORVASTATIN (LIPITOR) 10 MG TABLET    Take 1 tablet (10 mg total) by mouth daily.   CARVEDILOL (COREG) 12.5 MG TABLET    Take 1 tablet (12.5 mg total) by mouth daily.   CONTINUOUS BLOOD GLUC RECEIVER (FREESTYLE LIBRE 14 DAY READER) DEVI    1 Device by Does not apply route daily.   CONTINUOUS BLOOD GLUC SENSOR (FREESTYLE LIBRE 14 DAY SENSOR) MISC    1 Device by Does not apply route daily.   DAPTOMYCIN (CUBICIN) IVPB    Inject 500 mg into the vein  every other day. Indication:  Discitis/osteomyelitis  First Dose: Yes Last Day of Therapy:  08/09/22  Labs - Once weekly:  CBC/D, BMP, and CPK Labs - Every other week:  ESR and CRP Method of administration: IV Push Method of administration may be changed at the discretion of home infusion pharmacist based upon assessment of the patient and/or caregiver's ability to self-administer the medication ordered.   DIAZEPAM (VALIUM) 5 MG TABLET    Take 1 tablet (5 mg total) by mouth every 12 (twelve) hours as needed for anxiety.   DULOXETINE (CYMBALTA) 60 MG CAPSULE    Take 1 capsule (60 mg total) by mouth daily.   FERROUS SULFATE 325 (65 FE) MG TABLET    Take 1 tablet (325 mg total) by mouth 2 (two) times daily with a meal.   FLUTICASONE-SALMETEROL (ADVAIR HFA) 115-21 MCG/ACT INHALER    Inhale 2 puffs into the lungs 2 (two) times daily.   FOLIC ACID (FOLVITE) 1 MG TABLET    Take 2 tablets by mouth once daily   FUROSEMIDE (LASIX) 20 MG TABLET    Take 2 tablets (40 mg total) by mouth daily.   HYDROCODONE-ACETAMINOPHEN (NORCO/VICODIN) 5-325 MG TABLET    Take 1 tablet by mouth every 6 (six) hours as needed for moderate pain.   INSULIN GLARGINE (LANTUS SOLOSTAR) 100 UNIT/ML SOLOSTAR PEN    INJECT 8 UNITS SUBCUTANEOUSLY once a day   LUBIPROSTONE (AMITIZA) 24 MCG CAPSULE    Take 1 capsule (24 mcg total) by mouth 2 (two) times daily with a meal.   MONTELUKAST (SINGULAIR) 10 MG TABLET    Take 1 tablet (10 mg total) by mouth at bedtime.   MULTIPLE VITAMIN (MULTIVITAMIN WITH MINERALS) TABS TABLET    Take 1 tablet by mouth daily.   NONFORMULARY OR COMPOUNDED ITEM    Power wheelchair  #1  as directed   ONDANSETRON (ZOFRAN) 8 MG TABLET    Take 0.5 tablets (4 mg total) by mouth every 8 (eight) hours as needed for nausea or vomiting.   PREGABALIN (LYRICA) 25 MG CAPSULE    Take 1 capsule (25 mg total) by mouth 3 (three) times daily.   SODIUM BICARBONATE 650 MG TABLET    Take 1 tablet (650 mg total) by mouth 2 (two)  times daily.   TAMSULOSIN (FLOMAX) 0.4 MG CAPS CAPSULE    Take 1 capsule (0.4 mg total) by mouth daily.   VITAMIN D PO    Take 5,000 Units by mouth daily.  Modified Medications   No medications on file  Discontinued Medications   No medications on file    Subjective: [name] [name] is seen for her hospital follow-up visit.  I have seen her frequently over the several years with multiple, severe infections.  He started having increasing back pain about 3 months  ago.  She had previously had pseudomonal lumbar infection and lumbar fusion.  An MRI done on 05/15/2022 showed lucency around some of her fixation screws indicative of infection.  She was scheduled for surgery but it was delayed because that she required further red blood cell transfusions.  She was hospitalized on 06/21/2022 and underwent aspiration on 06/23/2022.  Cultures grew methicillin-resistant staph epi.  She was seen by my partners and started on IV daptomycin.  She underwent T4-11 fusion on 06/28/2022.  Her pain had been 10 out of 10 and she says that it is slightly improved now at 8-9 out of 10.  He has not had any problems tolerating her central line.  She has had 2-3 episodes of watery diarrhea but no persistent diarrhea.  She has not had any fever.  Review of Systems: Review of Systems  Constitutional:  Negative for chills, diaphoresis and fever.  Gastrointestinal:  Positive for diarrhea. Negative for abdominal pain, nausea and vomiting.  Musculoskeletal:  Positive for back pain.    Past Medical History:  Diagnosis Date   Acute MI [facility]) 2007   presented to ED & had cardiac cath- but found to have normal coronaries. Since that point in time her PCP cares f or cardiac needs. Dr. 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    Exertional shortness of breath    Fatty liver    Fibromyalgia    GERD (gastroesophageal reflux disease)    Hepatic steatosis    High cholesterol    History of MI (myocardial infarction) 10/06/2013   Hyperlipidemia 06/03/2008   Qualifier: Diagnosis of  By: Larose Kells MD, [place]    Hypertension    MRSA (methicillin resistant Staphylococcus aureus)    Neuropathy    lower legs   Osteoarthritis    hands, hips   Proximal humerus fracture 10/15/2012   Left   PTSD (post-traumatic stress disorder)    Retinopathy 04/17/2019   Sleep apnea 11/16/2017   SOB (shortness of breath) 10/11/2020   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   Qualifier: Diagnosis of  By: Charlott Holler CMA, [place]     Type 2 diabetes mellitus with hyperglycemia, with long-term current use of insulin ([place]) 04/06/2017    Social History   Tobacco Use   Smoking status: Never    Passive exposure: Never   Smokeless tobacco: Never  Vaping Use   Vaping Use: Never used  Substance Use Topics   Alcohol use: No    Alcohol/week: 0.0 standard drinks of alcohol   Drug use: No    Family History  Problem Relation Age of Onset   Heart disease Father    Diabetes Father    Colitis Father  Crohn's disease Father    Cancer Father        leukemia   Leukemia Father    Diabetes Mellitus II Brother    Kidney disease Brother    Heart disease Brother    Diabetes Mother    Hypertension Mother    Mental illness Mother    Irritable bowel syndrome Daughter    Diabetes Mellitus II Brother     Kidney disease Brother    Liver disease Brother    Kidney disease Brother    Heart attack Brother    Diabetes Mellitus II Brother    Heart disease Brother    Liver disease Brother    Kidney disease Brother    Kidney disease Brother    Diabetes Mellitus II Brother    Diabetes Mellitus I Brother     Allergies  Allergen Reactions   Bee Pollen Anaphylaxis   Broccoli [Brassica Oleracea] Anaphylaxis and Hives   Fish-Derived Products Anaphylaxis and Hives   Mushroom Extract Complex Anaphylaxis   Penicillins Anaphylaxis    **Tolerated cefepime March 2021 Did it involve swelling of the face/tongue/throat, SOB, or low BP? Yes Did it involve sudden or severe rash/hives, skin peeling, or any reaction on the inside of your mouth or nose? No Did you need to seek medical attention at a hospital or doctor's office? Yes When did it last happen?  A few months ago If all above answers are "NO", may proceed with cephalosporin use.   Rosemary Oil Anaphylaxis   Shellfish Allergy Anaphylaxis and Hives   Tomato Anaphylaxis and Hives    Raw tomatoes only, can tolerate if cooked   Acetaminophen Other (See Comments)    GI upset   Aloe Vera Hives   Acyclovir And Related Other (See Comments)    Unknown reaction   Naproxen Hives    Objective: Vitals:   08/01/22 1541  BP: (!) 168/70  Pulse: (!) 58  Temp: 97.6 F (36.4 C)  TempSrc: Oral  SpO2: 95%   There is no height or weight on file to calculate BMI.  Physical Exam Constitutional:      Comments: She is seated in a wheelchair.  She is accompanied by her husband.  She is wearing an external thoracic brace.  Cardiovascular:     Comments: Her left anterior central line site looks good. Musculoskeletal:     Comments: Her mid back incision is healing nicely.  There are a few residual scabbed areas but no erythema or drainage.     Lab Results    Problem List Items Addressed This Visit       High   Osteomyelitis of vertebra of  thoracolumbar region [facility])    She has recurrent vertebral infection, now with methicillin-resistant staph epi.  She will complete IV daptomycin therapy on 08/09/2022 and switch to oral doxycycline.  She has severe, anemia of chronic disease and is getting an iron infusion next week.  I will need to check with her other providers to see if she is likely to need red blood cell transfusions in the near future to help make a decision about the optimal time to remove her central line.  She will follow-up here in 2 weeks.      Relevant Medications   doxycycline (VIBRA-TABS) 100 MG tablet (Start on 08/10/2022)     Michel Bickers, MD [place] [facility] [phone] pager   [phone] cell 08/01/2022, 4:11 PM

## 2022-08-01 NOTE — Assessment & Plan Note (Signed)
She has recurrent vertebral infection, now with methicillin-resistant staph epi.  She will complete IV daptomycin therapy on 08/09/2022 and switch to oral doxycycline.  She has severe, anemia of chronic disease and is getting an iron infusion next week.  I will need to check with her other providers to see if she is likely to need red blood cell transfusions in the near future to help make a decision about the optimal time to remove her central line.  She will follow-up here in 2 weeks.

## 2022-08-02 ENCOUNTER — Encounter: Payer: Self-pay | Admitting: Family

## 2022-08-02 ENCOUNTER — Telehealth: Payer: Self-pay | Admitting: Family Medicine

## 2022-08-02 ENCOUNTER — Telehealth: Payer: Self-pay | Admitting: *Deleted

## 2022-08-02 NOTE — Telephone Encounter (Signed)
Per 07/26/22 los - called and lvm of upcoming appointments - requested callback to confirm

## 2022-08-02 NOTE — Telephone Encounter (Signed)
Verbal given. FYI

## 2022-08-02 NOTE — Telephone Encounter (Signed)
Marcelino Duster, Occupational Therapist with [facility], calling to let Dr. Etter Sjogren know that she has tried to schedule her OT for this week due to patient having other appointments so they will need to make it up next week. Please call her at [phone] to advise if okay to make it up next week.

## 2022-08-03 ENCOUNTER — Telehealth (HOSPITAL_BASED_OUTPATIENT_CLINIC_OR_DEPARTMENT_OTHER): Payer: Self-pay

## 2022-08-04 ENCOUNTER — Encounter: Admit: 2022-08-04 | Discharge: 2022-08-04 | Payer: MEDICARE

## 2022-08-04 DIAGNOSIS — E1129 Type 2 diabetes mellitus with other diabetic kidney complication: Secondary | ICD-10-CM

## 2022-08-08 ENCOUNTER — Other Ambulatory Visit: Payer: Self-pay | Admitting: Family Medicine

## 2022-08-08 DIAGNOSIS — Z981 Arthrodesis status: Secondary | ICD-10-CM

## 2022-08-08 DIAGNOSIS — M8610 Other acute osteomyelitis, unspecified site: Secondary | ICD-10-CM

## 2022-08-08 DIAGNOSIS — G894 Chronic pain syndrome: Secondary | ICD-10-CM

## 2022-08-08 MED ORDER — HYDROCODONE-ACETAMINOPHEN 5-325 MG PO TABS: 1.0000 | ORAL_TABLET | Freq: Four times a day (QID) | ORAL | 0 refills | Status: DC | PRN

## 2022-08-08 NOTE — Telephone Encounter (Signed)
Requesting: [facility]CO Contract: 02/14/2022 UDS: 02/14/2022 Last OV: 07/21/22 w/ [place] Next OV: N/A Last Refill:  07/09/2022, #[id number] RF Database:   Please advise

## 2022-08-08 NOTE — Telephone Encounter (Signed)
Medication: HYDROcodone-acetaminophen (NORCO/VICODIN) 5-325 MG tablet   Has the patient contacted their pharmacy? No. (If no, request that the patient contact the pharmacy for the refill.) (If yes, when and what did the pharmacy advise?)  Preferred Pharmacy (with phone number or street name):   Agent: Please be advised that RX refills may take up to 3 business days. [facility] [address] [place], [place] - [place], [place] [address] Phone: [phone]  Fax: [phone] rmacy.

## 2022-08-09 ENCOUNTER — Other Ambulatory Visit: Payer: Self-pay | Admitting: Hematology & Oncology

## 2022-08-09 ENCOUNTER — Telehealth: Payer: Self-pay | Admitting: *Deleted

## 2022-08-09 ENCOUNTER — Inpatient Hospital Stay: Payer: PRIVATE HEALTH INSURANCE

## 2022-08-09 ENCOUNTER — Inpatient Hospital Stay: Payer: PRIVATE HEALTH INSURANCE | Attending: Hematology & Oncology

## 2022-08-09 ENCOUNTER — Encounter: Payer: Self-pay | Admitting: Family

## 2022-08-09 ENCOUNTER — Other Ambulatory Visit: Payer: Self-pay

## 2022-08-09 ENCOUNTER — Encounter: Admit: 2022-08-09 | Discharge: 2022-08-09 | Payer: MEDICARE

## 2022-08-09 VITALS — BP 148/70 | HR 65 | Temp 97.4°F | Resp 17

## 2022-08-09 DIAGNOSIS — D631 Anemia in chronic kidney disease: Secondary | ICD-10-CM | POA: Insufficient documentation

## 2022-08-09 DIAGNOSIS — N1832 Chronic kidney disease, stage 3b: Secondary | ICD-10-CM

## 2022-08-09 DIAGNOSIS — N183 Chronic kidney disease, stage 3 unspecified: Secondary | ICD-10-CM | POA: Diagnosis present

## 2022-08-09 DIAGNOSIS — D508 Other iron deficiency anemias: Secondary | ICD-10-CM

## 2022-08-09 DIAGNOSIS — D500 Iron deficiency anemia secondary to blood loss (chronic): Secondary | ICD-10-CM

## 2022-08-09 LAB — CMP (CANCER CENTER ONLY)
ALT: 6 U/L (ref 0–44)
AST: 13 U/L — ABNORMAL LOW (ref 15–41)
Albumin: 3.7 g/dL (ref 3.5–5.0)
Alkaline Phosphatase: 84 U/L (ref 38–126)
Anion gap: 9 (ref 5–15)
BUN: 61 mg/dL — ABNORMAL HIGH (ref 8–23)
CO2: 21 mmol/L — ABNORMAL LOW (ref 22–32)
Calcium: 9.3 mg/dL (ref 8.9–10.3)
Chloride: 108 mmol/L (ref 98–111)
Creatinine: 4.21 mg/dL (ref 0.44–1.00)
GFR, Estimated: 11 mL/min — ABNORMAL LOW (ref >60)
Glucose, Bld: 102 mg/dL — ABNORMAL HIGH (ref 70–99)
Potassium: 4.6 mmol/L (ref 3.5–5.1)
Sodium: 138 mmol/L (ref 135–145)
Total Bilirubin: 0.7 mg/dL (ref 0.3–1.2)
Total Protein: 8.6 g/dL — ABNORMAL HIGH (ref 6.5–8.1)

## 2022-08-09 LAB — CBC WITH DIFFERENTIAL (CANCER CENTER ONLY)
Abs Immature Granulocytes: 0.03 10*3/uL (ref 0.00–0.07)
Basophils Absolute: 0.1 10*3/uL (ref 0.0–0.1)
Basophils Relative: 1 %
Eosinophils Absolute: 0.6 10*3/uL — ABNORMAL HIGH (ref 0.0–0.5)
Eosinophils Relative: 10 %
HCT: 24.5 % — ABNORMAL LOW (ref 36.0–46.0)
Hemoglobin: 7.6 g/dL — ABNORMAL LOW (ref 12.0–15.0)
Immature Granulocytes: 1 %
Lymphocytes Relative: 16 %
Lymphs Abs: 0.9 10*3/uL (ref 0.7–4.0)
MCH: 28.3 pg (ref 26.0–34.0)
MCHC: 31 g/dL (ref 30.0–36.0)
MCV: 91.1 fL (ref 80.0–100.0)
Monocytes Absolute: 0.5 10*3/uL (ref 0.1–1.0)
Monocytes Relative: 8 %
Neutro Abs: 3.8 10*3/uL (ref 1.7–7.7)
Neutrophils Relative %: 64 %
Platelet Count: 119 10*3/uL — ABNORMAL LOW (ref 150–400)
RBC: 2.69 MIL/uL — ABNORMAL LOW (ref 3.87–5.11)
RDW: 16.7 % — ABNORMAL HIGH (ref 11.5–15.5)
WBC Count: 5.9 10*3/uL (ref 4.0–10.5)
nRBC: 0 % (ref 0.0–0.2)

## 2022-08-09 LAB — PREPARE RBC (CROSSMATCH)

## 2022-08-09 MED ORDER — IRBESARTAN 150 MG PO TAB
300 mg | ORAL_TABLET | Freq: Every evening | ORAL | 1 refills | Status: AC
Start: 2022-08-09 — End: ?

## 2022-08-09 MED ORDER — EPOETIN ALFA-EPBX 40000 UNIT/ML IJ SOLN
40000.0000 [IU] | Freq: Once | INTRAMUSCULAR | Status: AC
  Administered 2022-08-09: 40000 [IU] via SUBCUTANEOUS
  Filled 2022-08-09: qty 1

## 2022-08-09 NOTE — Telephone Encounter (Signed)
Dr. Marin Olp notified of creat-4.21.  No new orders received at this time.

## 2022-08-09 NOTE — Patient Instructions (Signed)

## 2022-08-09 NOTE — Progress Notes
Pt pharmacy didn't get script for increased dose of irbesartan. Script re-sent with appropriate quantity.

## 2022-08-10 ENCOUNTER — Inpatient Hospital Stay: Payer: PRIVATE HEALTH INSURANCE

## 2022-08-10 ENCOUNTER — Telehealth: Payer: Self-pay | Admitting: Family Medicine

## 2022-08-10 ENCOUNTER — Encounter: Admit: 2022-08-10 | Discharge: 2022-08-10 | Payer: MEDICARE

## 2022-08-10 DIAGNOSIS — N183 Chronic kidney disease, stage 3 unspecified: Secondary | ICD-10-CM | POA: Diagnosis not present

## 2022-08-10 DIAGNOSIS — N1832 Chronic kidney disease, stage 3b: Secondary | ICD-10-CM

## 2022-08-10 MED ORDER — SODIUM CHLORIDE 0.9% FLUSH
10.0000 mL | INTRAVENOUS | Status: AC | PRN
  Administered 2022-08-10: 10 mL

## 2022-08-10 MED ORDER — SODIUM CHLORIDE 0.9% IV SOLUTION
250.0000 mL | Freq: Once | INTRAVENOUS | Status: AC
  Administered 2022-08-10: 250 mL via INTRAVENOUS

## 2022-08-10 MED ORDER — FUROSEMIDE 10 MG/ML IJ SOLN: 20.0000 mg | Freq: Once | INTRAMUSCULAR | Status: DC

## 2022-08-10 MED ORDER — DIPHENHYDRAMINE HCL 25 MG PO CAPS
25.0000 mg | ORAL_CAPSULE | Freq: Once | ORAL | Status: AC
  Administered 2022-08-10: 25 mg via ORAL
  Filled 2022-08-10: qty 1

## 2022-08-10 MED ORDER — ACETAMINOPHEN 325 MG PO TABS
650.0000 mg | ORAL_TABLET | Freq: Once | ORAL | Status: AC
  Administered 2022-08-10: 650 mg via ORAL
  Filled 2022-08-10: qty 2

## 2022-08-10 MED ORDER — HEPARIN SOD (PORK) LOCK FLUSH 100 UNIT/ML IV SOLN
250.0000 [IU] | INTRAVENOUS | Status: AC | PRN
  Administered 2022-08-10: 250 [IU]

## 2022-08-10 NOTE — Telephone Encounter (Signed)
[name]) called to get verbal orders for redoing the sterile dressing for pts central venous catheter to be changed weekly until catheter is removed

## 2022-08-10 NOTE — Patient Instructions (Signed)
Blood Transfusion, Adult A blood transfusion is a procedure in which you receive blood or a type of blood cell (blood component) through an IV. You may need a blood transfusion when you have a low blood count, which is a low number of any blood cell. This may result from a bleeding disorder, illness, injury, or surgery. The blood may come from a donor, or you may be able to have your own blood collected and stored (autologous blood donation) before a planned surgery. The blood given in a transfusion may be made up of different blood components. You may receive: Red blood cells. These carry oxygen to the cells in the body. Platelets. These help your blood to clot. Plasma. This is the liquid part of your blood. It carries proteins and other substances throughout the body. White blood cells. These help you fight infections. If you have hemophilia or another clotting disorder, you may also receive other types of blood products. Depending on the type of blood product, this procedure may take 1-4 hours to complete. Tell a health care provider about: Any bleeding problems you have. Any previous reactions you have had during a blood transfusion. Any allergies you have. All medicines you are taking, including vitamins, herbs, eye drops, creams, and over-the-counter medicines. Any surgeries you have had. Any medical conditions you have. Whether you are pregnant or may be pregnant. What are the risks? Talk with your health care provider about risks. The most common problems include: A mild allergic reaction, such as red, swollen areas of skin (hives) and itching. Fever or chills. This may be the body's response to new blood cells received. This may occur during or up to 4 hours after the transfusion. More serious problems may include: A serious allergic reaction that causes difficulty breathing or swelling around the face and lips. Transfusion-associated circulatory overload (TACO), or too much fluid in  the lungs. This may cause breathing problems. Transfusion-related acute lung injury (TRALI), which causes breathing difficulty and low oxygen in the blood. This can occur within hours of the transfusion or several days later. Iron overload. This can happen after receiving many blood transfusions over a period of time. Infection or virus being transmitted. This is rare because donated blood is carefully tested before it is given. Hemolytic transfusion reaction. This is rare. It happens when the body's defense system (immune system)tries to attack the new blood cells. Symptoms may include fever, chills, nausea, low blood pressure, and low back or chest pain. Transfusion-associated graft-versus-host disease (TAGVHD). This is rare. It happens when donated cells attack the body's healthy tissues. What happens before the procedure? You will have a blood test to check your blood type. This test is done to know what kind of blood your body will accept and to match it to the donor blood. If you are going to have a planned surgery, you may be able to do an autologous blood donation. This may be done in case you need to have a transfusion. You will have your temperature, blood pressure, and pulse checked before the transfusion. If you have had an allergic reaction to a transfusion in the past, you may be given medicine to help prevent a reaction. This medicine may be given to you by mouth (orally) or through an IV. What happens during the procedure?  An IV will be inserted into one of your veins. The bag of blood will be attached to your IV. The blood will then enter through your vein. Your temperature, blood pressure,  and pulse will be monitored during the transfusion. This monitoring is done to detect early signs of a transfusion reaction. Tell your nurse right away if you have any of these symptoms during the transfusion: Shortness of breath or trouble breathing. Chest or back pain. Fever or  chills. Itching or hives. If you have any signs or symptoms of a reaction, your transfusion will be stopped and you may be given medicine. When the transfusion is complete, your IV will be removed. Pressure may be applied to the IV site for a few minutes. A bandage (dressing)will be applied. The procedure may vary among health care providers and hospitals. What happens after the procedure? Your temperature, blood pressure, pulse, breathing rate, and blood oxygen level will be monitored until you leave the hospital or clinic. Your blood may be tested to see how you have responded to the transfusion. You may be warmed with fluids or blankets to maintain a normal body temperature. If you receive your blood transfusion in an outpatient setting, you will be told whom to contact to report any reactions. Where to find more information Visit the American Red Cross: redcross.org Summary A blood transfusion is a procedure in which you receive blood or a type of blood cell (blood component) through an IV. The blood given in a transfusion may be made up of different blood components. You may receive red blood cells, platelets, plasma, or white blood cells depending on the condition treated. Your temperature, blood pressure, and pulse will be monitored before, during, and after the transfusion. After the transfusion, your blood may be tested to see how your body has responded. This information is not intended to replace advice given to you by your health care provider. Make sure you discuss any questions you have with your health care provider. Document Revised: 01/13/2022 Document Reviewed: 01/13/2022 Elsevier Patient Education  Springer.

## 2022-08-10 NOTE — Telephone Encounter (Signed)
Verbal given 

## 2022-08-10 NOTE — Progress Notes
ICD-10 Code Documentation Request for Statin-Intolerant Patient    Dr. Paula Libra,    This patient has been marked as a candidate for statin therapy by their current insurance plan. Per chart review it does appear they have a history of intolerance. Medicare Payor plans require that this intolerance is documented by ICD 10 code on a once-yearly basis.     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    It may be beneficial to add this ICD-10 code to the patient's problem list as well, so that it can be easily remembered to be added as a visit diagnosis in upcoming years. Please do not hesitate to reach out to myself, or another Clinical Pharmacist if you have questions/concerns about this. Thank you!    Wynona Meals, [facility] Clinical Pharmacist

## 2022-08-11 LAB — TYPE AND SCREEN
ABO/RH(D): A NEG
Antibody Screen: NEGATIVE
Unit division: 0
Unit division: 0

## 2022-08-11 LAB — BPAM RBC
Blood Product Expiration Date: 202311032359
Blood Product Expiration Date: 202311062359
ISSUE DATE / TIME: 202310120750
ISSUE DATE / TIME: 202310120750
Unit Type and Rh: 600
Unit Type and Rh: 600

## 2022-08-14 ENCOUNTER — Telehealth: Payer: Self-pay | Admitting: Family Medicine

## 2022-08-14 NOTE — Telephone Encounter (Signed)
Medical review institute of [place] called stating this patient's insurance is requesting a peer-to-peer for the patient. They can be reached at [phone]  ext. [phone], case number [id number]. Please advise.

## 2022-08-15 ENCOUNTER — Encounter: Payer: Self-pay | Admitting: Internal Medicine

## 2022-08-15 ENCOUNTER — Other Ambulatory Visit: Payer: Self-pay

## 2022-08-15 ENCOUNTER — Ambulatory Visit (INDEPENDENT_AMBULATORY_CARE_PROVIDER_SITE_OTHER): Payer: PRIVATE HEALTH INSURANCE | Admitting: Internal Medicine

## 2022-08-15 ENCOUNTER — Other Ambulatory Visit: Payer: Self-pay | Admitting: Internal Medicine

## 2022-08-15 DIAGNOSIS — R197 Diarrhea, unspecified: Secondary | ICD-10-CM | POA: Diagnosis not present

## 2022-08-15 NOTE — Progress Notes (Signed)
Virtual Visit via Video Note  I connected with [name] [name] on 08/15/22 at  2:45 PM EDT by a video enabled telemedicine application and verified that I am speaking with the correct person using two identifiers.  Location: Patient: Home Provider: [facility]   I discussed the limitations of evaluation and management by telemedicine and the availability of in person appointments. The patient expressed understanding and agreed to proceed.  History of Present Illness: I called and spoke with [name] [name] today.  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  She was discharged on IV daptomycin and completed therapy on 08/09/2022 before switching to oral doxycycline.  She has had increasing problems with watery diarrhea, nausea and vomiting and stopped taking doxycycline yesterday.  She feels a little bit better today.  Her back pain is improving.  She has not had any fever.   Observations/Objective:   Assessment and Plan: I will collect a stool sample for C. difficile assay.  Follow Up Instructions: Observe off of antibiotics for now C. difficile assay Follow-up next week   I discussed the assessment and treatment plan with the patient. The patient was provided an opportunity to ask questions and all were answered. The patient agreed with the plan and demonstrated an understanding of the instructions.   The patient was advised to call back or seek an in-person evaluation if the symptoms worsen or if the condition fails to improve as anticipated.  I provided 18 minutes of non-face-to-face time during this encounter.   Michel Bickers, MD

## 2022-08-16 ENCOUNTER — Encounter: Payer: Self-pay | Admitting: Family

## 2022-08-16 ENCOUNTER — Encounter: Admit: 2022-08-16 | Discharge: 2022-08-16 | Payer: MEDICARE

## 2022-08-16 ENCOUNTER — Ambulatory Visit: Admit: 2022-08-16 | Discharge: 2022-08-16 | Payer: MEDICARE

## 2022-08-16 DIAGNOSIS — N1832 Stage 3b chronic kidney disease (HCC): Secondary | ICD-10-CM

## 2022-08-16 LAB — BASIC METABOLIC PANEL
ANION GAP: 11 (ref 3–12)
BLD UREA NITROGEN: 33 mg/dL — ABNORMAL HIGH (ref 7–25)
CALCIUM: 9.4 mg/dL (ref 8.5–10.6)
CHLORIDE: 105 MMOL/L (ref 98–110)
CO2: 21 MMOL/L (ref 21–30)
CREATININE: 1.8 mg/dL — ABNORMAL HIGH (ref 0.4–1.00)
EGFR: 29 mL/min — ABNORMAL LOW (ref >60)
GLUCOSE,PANEL: 276 mg/dL — ABNORMAL HIGH (ref 70–100)
POTASSIUM: 4.4 MMOL/L (ref 3.5–5.1)
SODIUM: 137 MMOL/L (ref 137–147)

## 2022-08-16 MED ORDER — GLIMEPIRIDE 2 MG PO TAB
ORAL_TABLET | 10 refills | Status: AC
Start: 2022-08-16 — End: ?

## 2022-08-16 NOTE — Telephone Encounter (Signed)
Pt called. LVM to return call 

## 2022-08-17 ENCOUNTER — Encounter: Admit: 2022-08-17 | Discharge: 2022-08-17 | Payer: MEDICARE

## 2022-08-17 MED ORDER — IRBESARTAN 300 MG PO TAB
300 mg | ORAL_TABLET | Freq: Every evening | ORAL | 1 refills | Status: AC
Start: 2022-08-17 — End: ?

## 2022-08-17 NOTE — Telephone Encounter
Alternative Requested:INSURANCE WONT PAY FOR 2 OF THE '150MG'$  TABLETS WANTS RX TO USE 1 OF THE '300MG'$  TABLETS PLEASE SEND NEW RX.          Rn sent in 300 mg tablets per pt. Request

## 2022-08-18 ENCOUNTER — Encounter: Admit: 2022-08-18 | Discharge: 2022-08-18 | Payer: MEDICARE

## 2022-08-23 ENCOUNTER — Other Ambulatory Visit: Payer: Self-pay

## 2022-08-23 ENCOUNTER — Ambulatory Visit (INDEPENDENT_AMBULATORY_CARE_PROVIDER_SITE_OTHER): Payer: PRIVATE HEALTH INSURANCE | Admitting: Internal Medicine

## 2022-08-23 DIAGNOSIS — M4625 Osteomyelitis of vertebra, thoracolumbar region: Secondary | ICD-10-CM

## 2022-08-23 NOTE — Progress Notes (Addendum)
Virtual Visit via Video Note  I connected with [name] [name] on 08/23/22 at 10:45 AM EDT by a video enabled telemedicine application and verified that I am speaking with the correct person using two identifiers.  Location: Patient: Home Provider: [facility]   I discussed the limitations of evaluation and management by telemedicine and the availability of in person appointments. The patient expressed understanding and agreed to proceed.  History of Present Illness: I called and spoke with [name] [name] today.  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  She was discharged on IV daptomycin and completed therapy on 08/09/2022 before switching to oral doxycycline.  She had increasing problems with watery diarrhea, nausea and vomiting and stopped taking doxycycline after 1 day.  Her diarrhea resolved promptly.  She was unable to bring in a specimen for C. difficile testing.  She is feeling better.  Her back pain is slowly improving.  Her central line remains in place.  She does not recall having a recent red blood cell transfusion but she did have erythropoietin infusion on 08/09/2022.   Observations/Objective:   Assessment and Plan: [name] [name] has had multiple severe infections over the last several years and has had frequent difficulty tolerating antibiotic therapy.  She prefers to stay off of antibiotics for now.  I will ask her providers at the cancer center to decide when her central line can be removed.  Follow Up Instructions: Observe off of antibiotics for now Follow-up in 6 weeks   I discussed the assessment and treatment plan with the patient. The patient was provided an opportunity to ask questions and all were answered. The patient agreed with the plan and demonstrated an understanding of the instructions.   The patient was advised to call back or seek an in-person evaluation if the symptoms  worsen or if the condition fails to improve as anticipated.  I provided 16 minutes of non-face-to-face time during this encounter.   Michel Bickers, MD

## 2022-08-25 ENCOUNTER — Encounter (HOSPITAL_BASED_OUTPATIENT_CLINIC_OR_DEPARTMENT_OTHER): Payer: PRIVATE HEALTH INSURANCE | Attending: General Surgery | Admitting: General Surgery

## 2022-08-25 DIAGNOSIS — E1169 Type 2 diabetes mellitus with other specified complication: Secondary | ICD-10-CM | POA: Diagnosis not present

## 2022-08-25 DIAGNOSIS — Z8614 Personal history of Methicillin resistant Staphylococcus aureus infection: Secondary | ICD-10-CM | POA: Insufficient documentation

## 2022-08-25 DIAGNOSIS — L97422 Non-pressure chronic ulcer of left heel and midfoot with fat layer exposed: Secondary | ICD-10-CM | POA: Insufficient documentation

## 2022-08-25 DIAGNOSIS — E1122 Type 2 diabetes mellitus with diabetic chronic kidney disease: Secondary | ICD-10-CM | POA: Insufficient documentation

## 2022-08-25 DIAGNOSIS — I509 Heart failure, unspecified: Secondary | ICD-10-CM | POA: Insufficient documentation

## 2022-08-25 DIAGNOSIS — M4624 Osteomyelitis of vertebra, thoracic region: Secondary | ICD-10-CM | POA: Insufficient documentation

## 2022-08-25 DIAGNOSIS — E1142 Type 2 diabetes mellitus with diabetic polyneuropathy: Secondary | ICD-10-CM | POA: Insufficient documentation

## 2022-08-25 DIAGNOSIS — N179 Acute kidney failure, unspecified: Secondary | ICD-10-CM | POA: Diagnosis not present

## 2022-08-25 DIAGNOSIS — N184 Chronic kidney disease, stage 4 (severe): Secondary | ICD-10-CM | POA: Diagnosis not present

## 2022-08-25 DIAGNOSIS — E1165 Type 2 diabetes mellitus with hyperglycemia: Secondary | ICD-10-CM | POA: Insufficient documentation

## 2022-08-25 DIAGNOSIS — E11621 Type 2 diabetes mellitus with foot ulcer: Secondary | ICD-10-CM | POA: Diagnosis present

## 2022-08-25 DIAGNOSIS — Z880 Allergy status to penicillin: Secondary | ICD-10-CM | POA: Diagnosis not present

## 2022-08-25 DIAGNOSIS — I130 Hypertensive heart and chronic kidney disease with heart failure and stage 1 through stage 4 chronic kidney disease, or unspecified chronic kidney disease: Secondary | ICD-10-CM | POA: Insufficient documentation

## 2022-08-25 DIAGNOSIS — I82503 Chronic embolism and thrombosis of unspecified deep veins of lower extremity, bilateral: Secondary | ICD-10-CM | POA: Insufficient documentation

## 2022-08-25 NOTE — Progress Notes (Signed)
KAROL, SKARZYNSKI R ([id number]) [id number] Page 1 of 4 Visit Report for 08/25/2022 Abuse Risk Screen Details Patient Name: Date of Service: [place], [name] 08/25/2022 1:30 PM Medical Record Number: [id number] Patient Account Number: [id number] Date of Birth/Sex: Treating RN: [birth year] (66 y.o. [name] Primary Care Caytlyn Evers: [name] Other Clinician: Referring Bebe Moncure: Treating Edgard Debord/Extender: Maxwell Marion in Treatment: 0 Abuse Risk Screen Items Answer ABUSE RISK SCREEN: Has anyone close to you tried to hurt or harm you recentlyo No Do you feel uncomfortable with anyone in your familyo No Has anyone forced you do things that you didnt want to doo No Electronic Signature(s) Signed: 08/25/2022 4:25:07 PM By: Adline Peals Entered By: Adline Peals on 08/25/2022 13:11:09 -------------------------------------------------------------------------------- Activities of Daily Living Details Patient Name: Date of Service: [name], [name] 08/25/2022 1:30 PM Medical Record Number: [id number] Patient Account Number: [id number] Date of Birth/Sex: Treating RN: [birth year] (66 y.o. [name] Primary Care Jarious Lyon: [name] Other Clinician: Referring Verdean Murin: Treating Shelita Steptoe/Extender: Maxwell Marion in Treatment: 0 Activities of Daily Living Items Answer Activities of Daily Living (Please select one for each item) Drive Automobile Not Able T Medications ake Completely Able Use T elephone Completely Able Care for Appearance Need Assistance Use T oilet Completely Able Bath / Shower Need Assistance Dress Self Need Assistance Feed Self Completely Able Walk Not Able Get In / Out Bed Need Assistance Housework Not Able Prepare Meals Not Able Handle Money Not Able Shop for Self Need Assistance Electronic Signature(s) Signed:  08/25/2022 4:25:07 PM By: Adline Peals Entered By: Adline Peals on 08/25/2022 13:12:11 [name] R ([id number]) [id number] Page 2 of 4 -------------------------------------------------------------------------------- Education Screening Details Patient Name: Date of Service: [name], [name] 08/25/2022 1:30 PM Medical Record Number: [id number] Patient Account Number: [id number] Date of Birth/Sex: Treating RN: Jun 17, 1956 (66 y.o. [name] Primary Care Lety Cullens: [name] Other Clinician: Referring Ashari Llewellyn: Treating Derril Franek/Extender: Maxwell Marion in Treatment: 0 Primary Learner Assessed: Patient Learning Preferences/Education Level/Primary Language Learning Preference: Explanation, Demonstration, Video, Printed Material Highest Education Level: College or Above Preferred Language: English Cognitive Barrier Language Barrier: No Translator Needed: No Memory Deficit: No Emotional Barrier: No Cultural/Religious Beliefs Affecting Medical Care: No Physical Barrier Impaired Vision: Yes Impaired Hearing: No Decreased Hand dexterity: No Knowledge/Comprehension Knowledge Level: Medium Comprehension Level: Medium Ability to understand written instructions: Medium Ability to understand verbal instructions: Medium Motivation Anxiety Level: Calm Cooperation: Cooperative Education Importance: Acknowledges Need Interest in Health Problems: Asks Questions Perception: Coherent Willingness to Engage in Self-Management Medium Activities: Readiness to Engage in Self-Management Medium Activities: Electronic Signature(s) Signed: 08/25/2022 4:25:07 PM By: Adline Peals Entered By: Adline Peals on 08/25/2022 13:12:51 -------------------------------------------------------------------------------- Fall Risk Assessment Details Patient Name: Date of Service: [name], [name]  08/25/2022 1:30 PM Medical Record Number: [id number] Patient Account Number: [id number] Date of Birth/Sex: Treating RN: 1956-06-11 (66 y.o. [name] Primary Care Erickson Yamashiro: [name] Other Clinician: Referring Kadelyn Dimascio: Treating Tyla Burgner/Extender: Maxwell Marion in Treatment: 0 Fall Risk Assessment Items Have you had 2 or more falls in the last [address] AUDREYANNA, BUTKIEWICZ R ([id number]) [phone] [id number] Page 3 of 4 Have you had any fall that resulted in injury in the last 12 monthso 0 Yes FALLS RISK SCREEN History of falling - immediate or within 3 months 0 No Secondary diagnosis (Do you have 2 or more medical diagnoseso) 15  Yes Ambulatory aid None/bed rest/wheelchair/nurse 0 Yes Crutches/cane/walker 0 No Furniture 0 No Intravenous therapy Access/Saline/Heparin Lock 0 No Gait/Transferring Normal/ bed rest/ wheelchair 0 Yes Weak (short steps with or without shuffle, stooped but able to lift head while walking, may seek 0 No support from furniture) Impaired (short steps with shuffle, may have difficulty arising from chair, head down, impaired 0 No balance) Mental Status Oriented to own ability 0 Yes Electronic Signature(s) Signed: 08/25/2022 4:25:07 PM By: Adline Peals Entered By: Adline Peals on 08/25/2022 13:13:17 -------------------------------------------------------------------------------- Foot Assessment Details Patient Name: Date of Service: [name], [name] 08/25/2022 1:30 PM Medical Record Number: [id number] Patient Account Number: [id number] Date of Birth/Sex: Treating RN: [birth year] (66 y.o. [name] Primary Care Khaleed Holan: [name] Other Clinician: Referring Brasen Bundren: Treating Khalifa Knecht/Extender: Maxwell Marion in Treatment: 0 Foot Assessment Items Site Locations + = Sensation present, - = Sensation absent, C = Callus, U  = Ulcer R = Redness, W = Warmth, M = Maceration, PU = Pre-ulcerative lesion F = Fissure, S = Swelling, D = Dryness Assessment Right: Left: Other Deformity: No No Prior Foot Ulcer: No No Prior Amputation: No No Charcot Joint: No No Ambulatory Status: Non-ambulatory Assistance Device: [address] SADY, MONACO R ([id number]) [phone].pdf Page 4 of 4 Gait: Electronic Signature(s) Signed: 08/25/2022 4:25:07 PM By: Adline Peals Entered By: Adline Peals on 08/25/2022 13:15:30 -------------------------------------------------------------------------------- Nutrition Risk Screening Details Patient Name: Date of Service: [name], [name] 08/25/2022 1:30 PM Medical Record Number: [id number] Patient Account Number: [id number] Date of Birth/Sex: Treating RN: 05-Jul-1956 (66 y.o. [name] Primary Care Hugh Kamara: [name] Other Clinician: Referring Jillyan Plitt: Treating Tayt Moyers/Extender: Maxwell Marion in Treatment: 0 Height (in): Weight (lbs): Body Mass Index (BMI): Nutrition Risk Screening Items Score Screening NUTRITION RISK SCREEN: I have an illness or condition that made me change the kind and/or amount of food I eat 2 Yes I eat fewer than two meals per day 3 Yes I eat few fruits and vegetables, or milk products 0 No I have three or more drinks of beer, liquor or wine almost every day 0 No I have tooth or mouth problems that make it hard for me to eat 0 No I don't always have enough money to buy the food I need 0 No I eat alone most of the time 0 No I take three or more different prescribed or over-the-counter drugs a day 1 Yes Without wanting to, I have lost or gained 10 pounds in the last six months 0 No I am not always physically able to shop, cook and/or feed myself 2 Yes Nutrition Protocols Good Risk Protocol Moderate Risk Protocol High Risk Proctocol 0 Provide education on  nutrition Risk Level: High Risk Score: 8 Electronic Signature(s) Signed: 08/25/2022 4:25:07 PM By: Adline Peals Entered By: Adline Peals on 08/25/2022 13:14:35

## 2022-08-25 NOTE — Progress Notes (Addendum)
DARREL, BARONI ([id number]) [id number] Page 1 of 17 Visit Report for 08/25/2022 Chief Complaint Document Details Patient Name: Date of Service: [name] [name] RE, [place] NNA [name]. 08/25/2022 1:30 PM Medical Record Number: [id number] Patient Account Number: [id number] Date of Birth/Sex: Treating RN: 12/20/1955 (66 y.o. [name] [name] Primary Care Provider: [name] Other Clinician: Referring Provider: Treating Provider/Extender: Maxwell Marion in Treatment: 0 Information Obtained from: Patient Chief Complaint Left heel ulcer 01/10/2021; patient returns to clinic with 2 large wounds on her bilateral heels 11/29/2021; patient returns to clinic with substantial wounds on the right foot and left heel. 08/25/2022: Patient returns to clinic with persistence of the left heel wound Electronic Signature(s) Signed: 08/25/2022 1:50:32 PM By: Fredirick Maudlin MD FACS Entered By: Fredirick Maudlin on 08/25/2022 13:50:32 -------------------------------------------------------------------------------- Debridement Details Patient Name: Date of Service: [name], [name] NNA [name]. 08/25/2022 1:30 PM Medical Record Number: [id number] Patient Account Number: [id number] Date of Birth/Sex: Treating RN: 06/11/1956 (66 y.o. [name] [name] Primary Care Provider: [name] Other Clinician: Referring Provider: Treating Provider/Extender: Maxwell Marion in Treatment: 0 Debridement Performed for Assessment: Wound #16 Left Calcaneus Performed By: Physician Fredirick Maudlin, MD Debridement Type: Debridement Level of Consciousness (Pre-procedure): Awake and Alert Pre-procedure Verification/Time Out Yes - 13:32 Taken: Start Time: 13:32 Pain Control: Lidocaine 4% T opical Solution T Area Debrided (L x W): otal 0.9 (cm) x 1 (cm) = 0.9 (cm) Tissue and other material debrided: Non-Viable, Callus, Slough, Skin:  Epidermis, Slough Level: Skin/Epidermis Debridement Description: Selective/Open Wound Instrument: Curette Bleeding: Minimum Hemostasis Achieved: Pressure Response to Treatment: Procedure was tolerated well Level of Consciousness (Post- Awake and Alert procedure): Post Debridement Measurements of Total Wound Length: (cm) 0.9 Width: (cm) 1 Depth: (cm) 0.3 Volume: (cm) 0.212 Character of Wound/Ulcer Post Debridement: Improved Post Procedure Diagnosis [name] [name], [name] [name] ([id number]) [id number] Page 2 of 17 Same as Pre-procedure Notes scribed for Dr. Celine Ahr by Adline Peals, RN Electronic Signature(s) Signed: 08/25/2022 2:19:05 PM By: Fredirick Maudlin MD FACS Signed: 08/25/2022 4:25:07 PM By: Adline Peals Entered By: Adline Peals on 08/25/2022 13:36:35 -------------------------------------------------------------------------------- HPI Details Patient Name: Date of Service: [name], [name] NNA [name]. 08/25/2022 1:30 PM Medical Record Number: [id number] Patient Account Number: [id number] Date of Birth/Sex: Treating RN: 1956/09/29 (66 y.o. [name] [name] Primary Care Provider: [name] Other Clinician: Referring Provider: Treating Provider/Extender: Maxwell Marion in Treatment: 0 History of Present Illness HPI Description: ADMISSION 11/12/2018 This is a 66 year old woman with type 2 diabetes and diabetic neuropathy. She has been dealing with a left heel plantar wound for roughly 2 years. She states this started when she pulled some skin off the area and it progressed into a wound. She also has an area on the tip of her left great toe for 1 year. She has been largely followed by Dr. Sharol Given and she had an excision of bone in the left heel in 2017. I do not see microbiology from this excision or pathology. Apparently this wound never really closed. She most recently has been using Silvadene cream. Offloading  this with a scooter. The last MRI I see was in June 2018 which did not show osteomyelitis at that time. Apparently this wound is never really progressed towards healing. During her last review by Dr. Sharol Given in October it was recommended that she undergo a left BKA and she refused. She went to see a second orthopedic consult at [facility] who am recommended conservative wound care to  see if this will close or progress towards closure but also warned that possible surgery may be necessary. An x-ray that was done at [facility] showed an irregular calcaneal body and calcaneal tuberosity. This is probably postprocedural. Previous x-rays at [facility] had suggested heterotrophic calcifications but I do not see this. Apparently a second ointment was added to the Silvadene which the patient thinks is because some improvement. She has not been systemically unwell. No fever or chills. She thinks the second ointment that was given to her at [facility] has helped somewhat. 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 ABIs done in June 2018 showed a ABI of 1.12 on the right and 1.1 on the left she was biphasic bilaterally. ABIs in our clinic were noncompressible today. 11/20/18 on evaluation today patient is seen for her second visit here in the office although this is actually the first visit with me concerning an issue that she's having with her great toe and heel of the left foot. Fortunately she does not appear to be have any discomfort at this time and she does have a scooter in order to offload her foot she also has a boot for offloading. With that being said this is something that appears to have been going on for some time  she was seeing Dr. due to his recommendation was that she was going to require a below knee amputation. With that being said she wanted to come to the wound center but according to the patient Dr. Sharol Given told her that "we could not help her". Nonetheless she saw another provider who suggested that it may be worth a shot for [name] to try and help her out if it all possible. Nonetheless she decided that it would be worth a trial since otherwise any the way she's gonna end up with an amputation. Obviously if we can heal the wound then that will not be the case. Again I explained to her that obviously there are no guarantees but we will give this a good try and attempts to get the wound to heal. 1/30; the patient continues to have areas on the left plantar heel and the left plantar great toe. The more worrisome area is the heel. She went for MRI on Saturday but this could not be done because she had silver alginate in the wound bed. I would like to get this rebooked. If she does not have osteomyelitis she will need a total contact cast. We have been using silver alginate in the wounds 2/7; patient has wounds on her left plantar heel and left plantar great toe. The area on the heel has some depth although it looks about the same today. Her MRI will finally be done tomorrow. If she has osteomyelitis in the heel and then we will need to consider her for IV antibiotics and hyperbaric oxygen. If the MRI is negative she will need a total contact cast although she is wearing a cam boot and using a scooter at present. Will be using silver alginate. She will take it off tomorrow in preparation for the MRI 2/14; the patient's MRI showed extensive surgical changes with a large portion of her calcaneus removed on the left secondary to her previous  surgery by Dr. Sharol Given however there is no evidence of osteomyelitis. She would therefore be a candidate for a total contact cast and we applied this for the first time today 2/21;  silver collagen total contact cast. The area on the plantar left great toe is "healed" still a lot of callus on this area. Dimensions on the plantar heel not too much different some epithelialization is present however. This is an improvement 12/25/18 on evaluation today patient actually is seen for follow-up concerning issues that she has been having with her cast she states she feels like it's wet and squishy in the bottom of her cast. The reason she does come in to have this evaluated and see what is going on. Fortunately there's no signs of infection at this time was the cast was removed. 3/6; the patient's area on the tip of her right great toe is callused but there is no open area here. She still has the fairly extensive area in the left heel. We have been using silver collagen in the wound. 01/08/19 on evaluation today patient actually appears to be doing very well in regard to her heel ulcer in my pinion to see if you shown signs of improvement which is excellent news. There's no evidence of infection. She continues to have quite a bit of drainage but fortunately again this doesn't seem to be hindering her healing. 3/20 -Patient's foot ulcer on the left appears to be doing well, the dimensions appear encouraging, we have been using total contact cast with Prisma and will continue doing that 3/27; patient continues to make nice progress on the left plantar heel. She is using silver collagen under a total contact cast. It is been a while since I have seen this wound and it really looks a lot better. Smaller with healthy granulation [name] [name], [name] [name] [name] ([id number]) [id number] Page 3 of 17 4/3; she continues to make nice progress on the left plantar heel. Using silver collagen under a total contact cast 4/10; left plantar heel. Again skin over the surface of the wound with not much in the way of adherence. This results in undermining. Using silver collagen changed to silver  alginate 4/17 left plantar heel. Again not much improvement. There is no undermining today no debridement was required we used silver alginate last time because of excessive moisture 4/24 left plantar heel. Again not as much improvement as I would have liked. About 3 mm of depth. Thick callused skin around the circumference. Using silver alginate 5/1; left plantar heel this is improved this week. Less depth epithelialization is present. Using silver alginate alginate under a total contact cast 5/8; left plantar heel dimensions are about the same. The depth appears to be improved. I use silver collagen starting today under the cast 5/15 left plantar heel. Arrives today with a 2-day history of feeling like something had "slipped" while walking her dog in the cast. Unfortunately extensive area relatively to the small wound of undermining superiorly denuded epithelium. 5/22-Patient returns at 1 week after being taken off the TCC on account of some fluid collection that was debrided and cultured at last visit from the plantar ulcer on the left heel. The culture results are polymicrobial with Klebsiella, enterococcus, Proteus growth these organisms are sensitive to Cipro except with enterococcus which is sensitive to ampicillin but patient is highly allergic to penicillin according to her. This wound appears larger and there is a new small skin depth wound on the great toe plantar aspect patient does have  hammertoes. 5/29; the patient arrived last week with a new wound on her left plantar great toe. With regards to the culture that I did 2 weeks ago of her deteriorating heel wound this grew Klebsiella and enterococcus. She was given Cipro however the enterococcus would not be covered well by a quinolone. She is allergic to penicillin. I will give her linezolid 600 twice daily x5 days 6/12; the patient continues to have a wound on her left and after she returns to the beach there is undermining laterally.  She has the new wound from this 2 weeks or so ago on the plantar tip of her left great toe. She had a fall today scraping the dorsal surface of the left fifth 7/14; READMISSION since the patient was last here she was hospitalized from 04/15/2019 through 04/22/2019. She had presented to an outside ER after falling and hitting her elbow. She had had previous surgery on the elbow and fractures several years ago. An x-ray was negative and she was sent home. She is readmitted with sepsis and acute renal failure secondary to septic arthritis of the elbow. Cultures apparently showed pansensitive staph aureus however she has a severe beta-lactam allergy. She was treated with vancomycin. Apparently the vancomycin is completed and her PICC line is removed although she is seeing Dr. Megan Salon tomorrow due to continued pain and swelling. In the hospital she had an IandD by Dr. [name] of orthopedics. The original surgery was on 04/17/2019 and it was felt that she had septic arthritis. She is still having a lot of pain and swelling in the right elbow and apparently is due to see Dr. Megan Salon tomorrow and what she thinks is that she will be restarted on antibiotics. With regards to her heel wounds/left foot wounds. Her arterial studies were checked and her ABIs were within normal limits. X-ray showed plantar foot ulcer negative for osteomyelitis postop resection of the posterior calcaneus. She has been using silver alginate to the heel 8/4-Patient returns after being seen on 7/14, we are using silver alginate to the calcaneal wound, she is continuing to receive care for her right elbow septic arthritis 8/14- Patient returns after 1 week, she is being seen by the surgical group for her right elbow, she is here for the left calcaneal wound for which we are using silver alginate this is about the same 8/21; this is a patient I readmitted to the clinic 5 weeks ago. She is continuing to have difficulties with a septic  arthritis of the right elbow she had after a fall and apparently has had surgical IandD's since the last time we saw her. She is also following with Dr. Megan Salon of infectious disease and is apparently on 3 oral antibiotics although at the time of this dictation I am not sure what they are. She comes in today with a necrotic surface on the left great toe with a blister laterally. This was still clearly an open wound. She had purulent drainage coming out of this. The original wound on the plantar aspect of her right heel in the setting of a Charcot foot is deep not open to bone but certainly not any better at all. We have been using silver alginate 8/28; dealing with septic arthritis of the right elbow. May need to go for further surgery here in the second week of September. She had a blister on the left great toe that was purulent [place] draining last week although a culture did not grow anything [already on antibiotics through  infectious disease for her elbow]. The punched-out area on her heel is just like it was when she first came in. This almost closed with a total contact cast there are no options for that now. The patient states she cannot stay off her foot having to do housework X-ray of the foot showed a moderate bunion and severe degenerative changes at the first metatarsal phalangeal joint there was a moderate plantar calcaneal spur. We will need to check the location of this. 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 It is oral. We are using silver collagen to the punched-out area on her left heel and to the summer superficial area of the left great toe. She is offloading this is best she can although judging by the amount of callus it is not enough. She thinks she has enough strength in her right elbow now to use her scooter. There is not an  option for a cast 9/18; still dealing with septic arthritis of the elbow she is apparently going for an MRI and a possible procedure next week she is on clindamycin and I have reviewed this with Dr. Hale Bogus notes and infectious disease. We are using silver collagen to the punched-out areas on her left heel and to the plantar aspect of her left great toe she is now using her scooter to get around and to help offload these areas 10/2; 2-week follow-up. Still on clindamycin I believe for the left elbow she is going for an MRI of the elbow over the weekend. She is then going to see orthopedics and infectious disease. The area on the left heel is certainly no better. This does not probe to bone however there is green drainage. She has an area on the tip of her toe. The area on the heel is in a wound we almost closed at one point with total contact casting. 10/9; one-week follow-up. Culture I did of the heel last week which was a swab culture admittedly showed moderate Enterococcus faecalis moderate Pseudomonas. The wound itself looks about the same. There is no palpable bone although the depth of it closely approximates bone. The heel is swollen but not erythematous. Her MRI is booked for next week. She also has an MRI of the right elbow. I've also lifted the last consult from Dr. Megan Salon who is following her for septic arthritis of the right elbow. I did not see him specifically comment on her left heel however the patient states that he is aware of this. I did not specifically address the organisms I cultured last week because of the possible effect of any additional cultures that will be done on the elbow. Additionally these were superficial not bone cultures 10/16; her MRI of the heel was put back to next week. She did have her elbow done. I have been in contact with Dr. Megan Salon about my concerns about osteomyelitis of the left heel. We have been using collagen. She also has a wound on the  plantar tip of her first toe 10/23; her MRI of the heel was negative for osteomyelitis but suggested a small abscess. She also had an MRI of her elbow that showed findings compatible with a septic joint. She went to see Dr. Megan Salon and she is going to have both oral and IV antibiotics which I am sure will cover any infection  in the heel. I did not actually see his note today. We are using silver collagen offloading the heel with a heel offloading boot 11/6; patient continues with silver collagen to both wound areas on her plantar left heel and plantar left great toe. She remains on daptomycin by Dr. Megan Salon of infectious disease. She complains of diarrhea. Dr. Megan Salon is aware of this. 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 she has been put on oral vancomycin I think a lot of high clinical suspicion for pseudomembranous colitis Because of the right elbow there are no options to completely off her left foot beyond the heel offloading boot we have now. Fortunately the left heel ulcer itself has remained static some improvement in the plantar left great toe 11/20; patient arrives for review of her left heel ulcer. I also note she has a difficult time with septic arthritis of the right elbow. She currently is on IV daptomycin which seems to have helped the drainage in her elbow [MSSA]. Because of high concern for pseudomembranous colitis she was also put on vancomycin orally and Flagyl orally. She was on Levaquin but that was discontinued because of the concern about C. difficile. She states her diarrhea is better. She arrives in clinic today with a large area of denuded skin on the lateral part of the heel. This had totally separated. We have been using silver alginate to the wound areas. She arrived in a scooter telling me she is offloading the foot is much as  possible. 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 Dr. Megan Salon stopped the daptomycin and now has her on vancomycin 1000 mg IV daily. This is supposed to go onto 1/18. She is being referred to [place] for what sounds like a elbow replacement type operation for her MSSA septic arthritis. [name] [name], [name] [name] ([id number]) [id number] Page 4 of 17 She arrives in clinic today with a blister on the medial part of the wound on her heel. She still has a lot of callus around the heel although the surface of the wound on the left heel looks somewhat better. READMISSION 03/19/2020 Mrs. Trulson is a 66 year old woman we have followed for almost all of 2020 with a neuropathic wound on her left heel and the tip of her left great toe.. We she had previously had a partial calcanectomy by Dr. Sharol Given because of underlying osteomyelitis. She also had a history of MRSA even when we admitted her to the clinic. An MRI when she first came into our clinic did not show osteomyelitis. We put her in a total contact cast and gradually this contracted to the point it was almost closed however in that same timeframe she had a fall. 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 We had to stop putting her in a cast partially because of infection and partially because she could not support herself with the elbow. Things went progressively downhill. When we last saw her at the end of December she had a large open wound on the left heel. She had had recurrent infections in the right elbow. It was felt that either the heel lift: Ice her elbow or vice versa. We never had  MRSA I do not believe cultured in the left heel however. She continued to have problems and I think in March 2021 was found to  have osteomyelitis of L1. She underwent an operative debridement and a T11-L3 fusion. An operative culture grew Pseudomonas. She was treated with cefepime and required readmission to the hospital with acute kidney failure requiring temporary dialysis. She was discharged to rehab I believe she signed herself out [facility]. He has been following with her primary doctor and she comes in the clinic today with miraculously the left heel totally closed. She is followed up with Dr. Megan Salon of infectious disease he does not feel she has any evidence of infection in her elbow or her heel for that matter and the heel is actually fully epithelialized. She is on ciprofloxacin 500 twice daily I think mostly directed at Pseudomonas. The patient is convinced that it was the treatment of the Pseudomonas in her back that ultimately led to closure of her heel and that certainly possible or could have been because she was nonambulatory in the hospital for 3 months according to her. She is still using her heel offloading boot and she says she is "learning to walk again" Readmission: 06/09/2020 patient presents today for reevaluation here in the clinic following a reopening of her heel ulcer. She tells [name] that this actually occurred about 2 weeks after she was last seen in May of this year though it is now the beginning of August and she has not come back until now to see [name]. Nonetheless she does have an open wound there is been no x-rays at this time infectious disease has been monitoring him following this up until this point. They made the recommendation that she come to see [name]. Fortunately there is no signs of active infection systemically though she does tell me that she had Pseudomonas in the spine when she had her back surgery she is currently on antibiotics for that. 8/20; this is a patient who I saw 1 time in late May. Apparently shortly after she was here the area on her plantar left heel started to  drain again and she had an open wound. She has been going to her primary physician and has been following with Dr. Megan Salon. 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 She is currently on 3 times a day prophylactic ciprofloxacin. She is admitted to the clinic last week for review of the wound on the left heel. Notable for the fact that she had a partial calcanectomy by Dr. Sharol Given 3 or 4 years ago. She says she is using her motorized wheelchair again as an off loader but she came in in regular shoes. 9/2; 2-week follow-up. Plantar left heel. In spite of debridement last week she had thick very adherent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 plantar right foot wound has closed in some but this appears to be primarily due to callus. She continues to have accumulation of callus around her left calcaneal wound. The fat remains somewhat gray and necrotic appearing. 02/10/2022: The wound on the dorsal lateral surface of her right foot is closed. The plantar right foot wound has closed in and this seems to be actual wound contracture, rather than accumulation of callus. The left calcaneal wound has some surrounding maceration, but the wound itself is markedly improved. It is smaller and has a nice bed of granulation tissue. 02/17/2022: The right plantar foot wound continues to close in. There has been some callus accumulation, but once this was removed, the overall wound size appeared smaller. The left calcaneal wound has a much better appearance this week, particularly the periwound skin. There was some slough on the wound LAKEISHA, WALDROP [name] ([id number]) [id number] Page 12 of 17 surface but this peeled away to reveal nice granulation tissue. 03/10/2022: The patient has been absent from clinic due to having eye surgery. Today, the right plantar foot wound has healed. The left calcaneal wound has a fair amount of accumulated slough and is a little bit deeper. 03/31/2022: The right foot wound remains closed. The left wound is quite a bit shallower and smaller today. There is some slough and periwound callus accumulation. Is7/14; right foot remains closed. The area on the  left plantar heel toe open. There is necrotic material on the surface and periwound callus. 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 She returns today with her left heel ulcer remaining open. It is the same, in terms of dimensions as it was prior to her back issues presenting. There is thick callus, slough, and eschar accumulation. No malodor or purulent drainage. She no longer walks and rides in a motorized scooter. Patient History Information obtained from Patient. Allergies Fish Containing Products (Reaction: hives, swelling), mushroom (Severity: Severe, Reaction: anaphylaxis), penicillin (Severity: Severe, Reaction: throat swelling), tomato, raw (Reaction: hives), rosemary (Severity: Severe, Reaction: anaphylaxis), Shellfish Containing Products (Severity: Severe, Reaction: hives, swelling), aloe vera (Reaction: rash), bee venom protein (honey bee) (Severity: Moderate, Reaction: hives, swelling), acetaminophen (Reaction: GI upset), acyclovir (Reaction: unknown), naproxen (Reaction: unknown), doxycycline Family History Diabetes - Maternal Grandparents,Paternal Grandparents,Father,Siblings, Heart Disease - Mother,Father,Siblings, Hypertension - Father, No family history of Cancer, Hereditary Spherocytosis, Kidney Disease, Lung Disease, Seizures, Stroke, Thyroid Problems, Tuberculosis. Social History Never smoker, Marital Status - Married, Alcohol Use - Never, Drug Use - No History, Caffeine Use - Moderate - tea. Medical History Eyes Patient has history of Cataracts - left eye removed, mild right eye Denies history of Glaucoma, Optic Neuritis Ear/Nose/Mouth/Throat Denies history of Chronic sinus problems/congestion, Middle ear problems Hematologic/Lymphatic Patient  has history of Anemia Denies history of Hemophilia, Human Immunodeficiency Virus, Lymphedema, Sickle Cell Disease Respiratory Patient has history of Sleep Apnea Denies history of Aspiration, Asthma, Chronic Obstructive Pulmonary Disease (COPD), Pneumothorax, Tuberculosis Cardiovascular Patient has history of Coronary Artery Disease, Hypertension, Myocardial Infarction - 2014 Gastrointestinal Denies history of Cirrhosis , Colitis, Crohnoos, Hepatitis A, Hepatitis B, Hepatitis C Endocrine Patient has history of Type II Diabetes Denies history of Type I Diabetes Immunological Denies history of Lupus Erythematosus, Raynaudoos, Scleroderma Integumentary (Skin) Denies history of History of Burn Musculoskeletal Patient has history of Osteoarthritis, Osteomyelitis - left and right foot Denies history of Gout, Rheumatoid Arthritis Neurologic Patient has history of Neuropathy Denies history of Dementia, Quadriplegia, Paraplegia, Seizure Disorder Oncologic Denies history of Received Chemotherapy, Received Radiation Psychiatric Denies history of Anorexia/bulimia, Confinement Anxiety Hospitalization/Surgery History - left heel surgery. - hysterectomy. - cholecystectomy. - right elbow surgery x4. - December 2021. - multiple back surgeries. - osteomyelitis in back in hospital x3 weeks. - port insertion. 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 Vitals Time Taken: 1:08 PM, Temperature: 98.4 F, Pulse: 64 bpm, Respiratory Rate: 18 breaths/min, Blood Pressure: 111/59  mmHg. Respiratory Normal work of breathing on room air.. General Notes: 08/25/2022: The heel ulcer is the same, in terms of dimensions as it was prior to her back issues presenting. There is thick callus, slough, and eschar accumulation. No malodor or purulent drainage. Integumentary (Hair, Skin) Wound #16 status is Open. Original cause of wound was Gradually Appeared. The date acquired was: 10/31/2021. The wound is located on the Left Calcaneus. The wound measures 0.9cm length x 1cm width x 0.3cm depth; 0.707cm^2 area and 0.212cm^3 volume. There is Fat Layer (Subcutaneous Tissue) exposed. There is no tunneling or undermining noted. There is a medium amount of serosanguineous drainage noted. The wound margin is distinct with the outline attached to the wound base. There is large (67-100%) red granulation within the wound bed. There is a small (1-33%) amount  of necrotic tissue within the wound bed including Eschar. The periwound skin appearance had no abnormalities noted for color. The periwound skin appearance exhibited: Callus, Dry/Scaly. Periwound temperature was noted as No Abnormality. 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 There was a Selective/Open Wound Skin/Epidermis Debridement o with a total area of 0.9 sq cm performed by Fredirick Maudlin, MD. With the following instrument(s): Curette to remove Non-Viable tissue/material. Material removed includes Callus, Slough, and Skin: Epidermis after achieving  pain control using Lidocaine 4% Topical Solution. No specimens were taken. A time out was conducted at 13:32, prior to the start of the procedure. A Minimum amount of bleeding was controlled with Pressure. The procedure was tolerated well. Post Debridement Measurements: 0.9cm length x 1cm width x 0.3cm depth; 0.212cm^3 volume. Character of Wound/Ulcer Post Debridement is improved. Post procedure Diagnosis Wound #16: Same as Pre-Procedure General Notes: scribed for Dr. Celine Ahr by Adline Peals, RN. Plan Follow-up Appointments: Return Appointment in 2 weeks. - Dr. Celine Ahr - room 2 Anesthetic: (In clinic) Topical Lidocaine 4% applied to wound bed Bathing/ Shower/ Hygiene: May shower with protection but do not get wound dressing(s) wet. Edema Control - Lymphedema / SCD / Other: Elevate legs to the level of the heart or above for 30 minutes daily and/or when sitting, a frequency of: - throughout the day Avoid standing for long periods of time. Moisturize legs daily. Consults ordered were: Podiatry - referral for diabetic nail trimming WOUND #16: - Calcaneus Wound Laterality: Left Cleanser: Soap and Water 1 x Per Day/30 Days Discharge Instructions: May shower and wash wound with dial antibacterial soap and water prior to dressing change. Cleanser: Wound Cleanser 1 x Per Day/30 Days Discharge Instructions: Cleanse the wound with wound cleanser prior to applying a clean dressing using gauze sponges, not tissue or cotton balls. [name] [name], [name] [name] ([id number]) [id number] Page 14 of 17 Prim Dressing: KerraCel Ag Gelling Fiber Dressing, 2x2 in (silver alginate) 1 x Per Day/30 Days ary Discharge Instructions: Apply silver alginate to wound bed as instructed Secondary Dressing: Optifoam Non-Adhesive Dressing, 4x4 in 1 x Per Day/30 Days Discharge Instructions: Apply over primary dressing as directed. Secondary Dressing: Woven Gauze Sponge, Non-Sterile 4x4 in 1 x Per Day/30  Days Discharge Instructions: Apply over primary dressing as directed. Secured With: [employer], 4.5x3.1 (in/yd) 1 x Per Day/30 Days Discharge Instructions: Secure with Kerlix as directed. Secured With: 53M Medipore H Soft Cloth Surgical T ape, 4 x 10 (in/yd) 1 x Per Day/30 Days Discharge Instructions: Secure with tape as directed. 08/25/2022: She returns to clinic today after several months absence due to multiple problems in her spine and surgeries related thereto. The heel ulcer is the same, in terms of dimensions as it was prior to her back issues presenting. There is thick callus, slough, and eschar accumulation. No malodor or purulent drainage. I used a curette to debride slough, callus, and a bit of skin from the wound. We will go back to silver alginate and a heel cup. Her husband does her dressing changes, and frankly I am impressed with the condition of the wound given  her long absence from clinic. She will follow-up in 2 weeks. Electronic Signature(s) Signed: 08/25/2022 1:54:59 PM By: Fredirick Maudlin MD FACS Entered By: Fredirick Maudlin on 08/25/2022 13:54:59 -------------------------------------------------------------------------------- HxROS Details Patient Name: Date of Service: [name], [name] NNA [name]. 08/25/2022 1:30 PM Medical Record Number: [id number] Patient Account Number: [id number] Date of Birth/Sex: Treating RN: Jun 14, 1956 (66 y.o. [name] [name] Primary Care Provider: [name] Other Clinician: Referring Provider: Treating Provider/Extender: Maxwell Marion in Treatment: 0 Information Obtained From Patient Constitutional Symptoms (General Health) Medical History: Past Medical History Notes: morbid obesity, fibromyalgia Eyes Medical History: Positive for: Cataracts - left eye removed, mild right eye Negative for: Glaucoma; Optic Neuritis Past Medical History Notes: macular degeneration left  eye Ear/Nose/Mouth/Throat Medical History: Negative for: Chronic sinus problems/congestion; Middle ear problems Past Medical History Notes: seasonal allergies Hematologic/Lymphatic Medical History: Positive for: Anemia Negative for: Hemophilia; Human Immunodeficiency Virus; Lymphedema; Sickle Cell Disease Respiratory Medical History: Positive for: Sleep Apnea Negative for: Aspiration; Asthma; Chronic Obstructive Pulmonary Disease (COPD); Pneumothorax; Tuberculosis CAASI, GIGLIA ([id number]) [id number] Page 15 of 17 Cardiovascular Medical History: Positive for: Coronary Artery Disease; Hypertension; Myocardial Infarction - 2014 Past Medical History Notes: hyperlipidemia Gastrointestinal Medical History: Negative for: Cirrhosis ; Colitis; Crohns; Hepatitis A; Hepatitis B; Hepatitis C Past Medical History Notes: fatty liver Endocrine Medical History: Positive for: Type II Diabetes Negative for: Type I Diabetes Time with diabetes: 10 yrs Treated with: Insulin Blood sugar tested every day: Yes Tested : 2-3 times a week Blood sugar testing results: Breakfast: 100; Lunch: 120 Genitourinary Medical History: Past Medical History Notes: CKD stage 3 Immunological Medical History: Negative for: Lupus Erythematosus; Raynauds; Scleroderma Integumentary (Skin) Medical History: Negative for: History of Burn Musculoskeletal Medical History: Positive for: Osteoarthritis; Osteomyelitis - left and right foot Negative for: Gout; Rheumatoid Arthritis Past Medical History Notes: osteomyelitis in back Neurologic Medical History: Positive for: Neuropathy Negative for: Dementia; Quadriplegia; Paraplegia; Seizure Disorder Past Medical History Notes: cervical ruptured disc Oncologic Medical History: Negative for: Received Chemotherapy; Received Radiation Psychiatric Medical History: Negative for: Anorexia/bulimia; Confinement Anxiety HBO Extended  History Items Eyes: Cataracts Immunizations Pneumococcal Vaccine: Received Pneumococcal Vaccination: No Implantable Devices None SHEKINAH, PITONES [name] ([id number]) [id number] Page 16 of 17 Hospitalization / Surgery History Type of Hospitalization/Surgery left heel surgery hysterectomy cholecystectomy right elbow surgery x4 December 2021 multiple back surgeries osteomyelitis in back in hospital x3 weeks port insertion Family and Social History Cancer: No; Diabetes: Yes - Maternal Grandparents,Paternal Grandparents,Father,Siblings; Heart Disease: Yes - Mother,Father,Siblings; Hereditary Spherocytosis: No; Hypertension: Yes - Father; Kidney Disease: No; Lung Disease: No; Seizures: No; Stroke: No; Thyroid Problems: No; Tuberculosis: No; Never smoker; Marital Status - Married; Alcohol Use: Never; Drug Use: No History; Caffeine Use: Moderate - tea; Financial Concerns: No; Food, Clothing or Shelter Needs: No; Support System Lacking: No; Transportation Concerns: No Electronic Signature(s) Signed: 08/25/2022 2:19:05 PM By: Fredirick Maudlin MD FACS Signed: 08/25/2022 4:25:07 PM By: Adline Peals Entered By: Adline Peals on 08/25/2022 13:11:02 -------------------------------------------------------------------------------- SuperBill Details Patient Name: Date of Service: [name], [name] NNA [name]. 08/25/2022 Medical Record Number: [id number] Patient Account Number: [id number] Date of Birth/Sex: Treating RN: [birth year] (66 y.o. [name] [name] Primary Care Provider: [name] Other Clinician: Referring Provider: Treating Provider/Extender: Maxwell Marion in Treatment: 0 Diagnosis Coding ICD-10 Codes Code Description [phone] Non-pressure chronic ulcer of left heel and midfoot with fat layer exposed E11.65 Type 2 diabetes mellitus with hyperglycemia I82.503 Chronic embolism and thrombosis of unspecified deep veins of  lower extremity, bilateral I10 Essential (primary) hypertension E11.621 Type 2 diabetes mellitus with foot ulcer M46.25 Osteomyelitis of vertebra, thoracolumbar region E11.42 Type 2 diabetes mellitus with diabetic polyneuropathy Facility Procedures : CPT4 Code: [id number] Description: 89169 - WOUND CARE VISIT-LEV 3 EST PT Modifier: Quantity: 1 : CPT4 Code: [id number] Description: 80034 - DEBRIDE WOUND 1ST 20 SQ CM OR < ICD-10 Diagnosis Description L97.422 Non-pressure chronic ulcer of left heel and midfoot with fat layer exposed Modifier: Quantity: 1 Physician Procedures : CPT4 Code Description Modifier [id number] - WC PHYS LEVEL 4 - EST PT 25 ICD-10 Diagnosis Description L97.422 Non-pressure chronic ulcer of left heel and midfoot with fat layer exposed E11.621 Type 2 diabetes mellitus with foot ulcer E11.65 Type 2  diabetes mellitus with hyperglycemia E11.42 Type 2 diabetes mellitus with diabetic polyneuropathy Quantity: 1 : [id number] - WC PHYS DEBR WO ANESTH 20 SQ CM ICD-10 Diagnosis Description SHAMANDA, LEN ([id number]) [id number] ICD-10 Diagnosis Description L97.422 Non-pressure chronic ulcer of left heel and midfoot with fat layer exposed Quantity: 1 7.pdf Page 17 of 17 Electronic Signature(s) Signed: 08/29/2022 10:17:27 AM By: Deon Pilling RN, BSN Signed: 08/29/2022 11:15:04 AM By: Fredirick Maudlin MD FACS Previous Signature: 08/25/2022 1:55:34 PM Version By: Fredirick Maudlin MD FACS Entered By: Deon Pilling on 08/29/2022 10:17:26

## 2022-08-25 NOTE — Progress Notes (Signed)
EMEREE, MAHLER [name] ([id number]) [id number] Page 1 of 9 Visit Report for 08/25/2022 Allergy List Details Patient Name: Date of Service: [name] [name], [name] Dakota NNA [name]. 08/25/2022 1:30 PM Medical Record Number: [id number] Patient Account Number: [id number] Date of Birth/Sex: Treating RN: [birth year] (66 y.o. [name] [name] Primary Care Solana Coggin: [name] Other Clinician: Referring Robi Dewolfe: Treating Kymoni Lesperance/Extender: Maxwell Marion in Treatment: 0 Allergies Active Allergies Fish Containing Products Reaction: hives, swelling mushroom Reaction: anaphylaxis Severity: Severe penicillin Reaction: throat swelling Severity: Severe tomato, raw Reaction: hives rosemary Reaction: anaphylaxis Severity: Severe Shellfish Containing Products Reaction: hives, swelling Severity: Severe aloe vera Reaction: rash bee venom protein (honey bee) Reaction: hives, swelling Severity: Moderate acetaminophen Reaction: GI upset acyclovir Reaction: unknown naproxen Reaction: unknown doxycycline Allergy Notes Electronic Signature(s) Signed: 08/25/2022 4:25:07 PM By: Adline Peals Entered By: Adline Peals on 08/25/2022 13:09:17 [name] ([id number]) [id number] Page 2 of 9 -------------------------------------------------------------------------------- Arrival Information Details Patient Name: Date of Service: [name] [name], [name] Dakota NNA [name]. 08/25/2022 1:30 PM Medical Record Number: [id number] Patient Account Number: [id number] Date of Birth/Sex: Treating RN: May 14, 1956 (66 y.o. [name] [name] Primary Care Desteni Piscopo: [name] Other Clinician: Referring Burnie Therien: Treating Aricela Bertagnolli/Extender: Maxwell Marion in Treatment: 0 Visit Information Patient Arrived: Other Arrival Time: 13:04 Accompanied By: self Transfer Assistance: None Patient Identification Verified:  Yes Secondary Verification Process Completed: Yes Patient Has Alerts: Yes Patient Alerts: Patient on Blood Thinner History Since Last Visit Added or deleted any medications: No Any new allergies or adverse reactions: No Had a fall or experienced change in activities of daily living that may affect risk of falls: No Signs or symptoms of abuse/neglect since last visito No Hospitalized since last visit: Yes Implantable device outside of the clinic excluding cellular tissue based products placed in the center since last visit: No Pain Present Now: Yes Electronic Signature(s) Signed: 08/25/2022 4:25:07 PM By: Adline Peals Entered By: Adline Peals on 08/25/2022 13:08:45 -------------------------------------------------------------------------------- Clinic Level of Care Assessment Details Patient Name: Date of Service: [name] [name], [name] NNA [name]. 08/25/2022 1:30 PM Medical Record Number: [id number] Patient Account Number: [id number] Date of Birth/Sex: Treating RN: December 14, 1955 (66 y.o. [name] [name] Primary Care Favian Kittleson: [name] Other Clinician: Referring Lavra Imler: Treating Matty Vanroekel/Extender: Maxwell Marion in Treatment: 0 Clinic Level of Care Assessment Items TOOL 1 Quantity Score X- 1 0 Use when EandM and Procedure is performed on INITIAL visit ASSESSMENTS - Nursing Assessment / Reassessment X- 1 20 General Physical Exam (combine w/ comprehensive assessment (listed just below) when performed on new pt. evals) X- 1 25 Comprehensive Assessment (HX, ROS, Risk Assessments, Wounds Hx, etc.) ASSESSMENTS - Wound and Skin Assessment / Reassessment []  - 0 Dermatologic / Skin Assessment (not related to wound area) ASSESSMENTS - Ostomy and/or Continence Assessment and Care []  - 0 Incontinence Assessment and Management []  - 0 Ostomy Care Assessment and Management (repouching, etc.) PROCESS - Coordination of Care X - Simple Patient / Family  Education for ongoing care 1 15 []  - 0 Complex (extensive) Patient / Family Education for ongoing care X- 1 10 Staff obtains Consents, Records, T Results / Process Orders est []  - 0 Staff telephones HHA, Nursing Homes / Clarify orders / etc []  - 0 Routine Transfer to another Facility (non-emergent condition) [name] [name], [name] [name] ([id number]) [id number] Page 3 of 9 []  - 0 Routine Hospital Admission (non-emergent condition) X- 1 15 New Admissions / Biomedical engineer / Ordering NPWT Apligraf, etc. , []  - 0 Emergency  Hospital Admission (emergent condition) PROCESS - Special Needs []  - 0 Pediatric / Minor Patient Management []  - 0 Isolation Patient Management []  - 0 Hearing / Language / Visual special needs []  - 0 Assessment of Community assistance (transportation, D/C planning, etc.) []  - 0 Additional assistance / Altered mentation []  - 0 Support Surface(s) Assessment (bed, cushion, seat, etc.) INTERVENTIONS - Miscellaneous []  - 0 External ear exam []  - 0 Patient Transfer (multiple staff / Civil Service fast streamer / Similar devices) []  - 0 Simple Staple / Suture removal (25 or less) []  - 0 Complex Staple / Suture removal (26 or more) []  - 0 Hypo/Hyperglycemic Management (do not check if billed separately) []  - 0 Ankle / Brachial Index (ABI) - do not check if billed separately Has the patient been seen at the hospital within the last three years: Yes Total Score: 85 Level Of Care: New/Established - Level 3 Electronic Signature(s) Signed: 08/25/2022 4:25:07 PM By: Adline Peals Entered By: Adline Peals on 08/25/2022 13:55:14 -------------------------------------------------------------------------------- Encounter Discharge Information Details Patient Name: Date of Service: [name] [name], [name] NNA [name]. 08/25/2022 1:30 PM Medical Record Number: [id number] Patient Account Number: [id number] Date of Birth/Sex: Treating RN: [birth year] (66 y.o. [name] [name] Primary Care Dymon Summerhill: [name] Other Clinician: Referring Kelven Flater: Treating Leverett Camplin/Extender: Maxwell Marion in Treatment: 0 Encounter Discharge Information Items Post Procedure Vitals Discharge Condition: Stable Temperature (F): 98.4 Ambulatory Status: Other Pulse (bpm): 64 Discharge Destination: Home Respiratory Rate (breaths/min): 18 Transportation: Private Auto Blood Pressure (mmHg): 111/59 Accompanied By: self Schedule Follow-up Appointment: Yes Clinical Summary of Care: Patient Declined Electronic Signature(s) Signed: 08/25/2022 4:25:07 PM By: Adline Peals Entered By: Adline Peals on 08/25/2022 14:04:47 [name] [name] ([id number]) [id number] Page 4 of 9 -------------------------------------------------------------------------------- Lower Extremity Assessment Details Patient Name: Date of Service: [name] [name], [name] NNA [name]. 08/25/2022 1:30 PM Medical Record Number: [id number] Patient Account Number: [id number] Date of Birth/Sex: Treating RN: 1956-06-22 (66 y.o. [name] [name] Primary Care Cecil Bixby: [name] Other Clinician: Referring Romyn Boswell: Treating Lavontae Cornia/Extender: Maxwell Marion in Treatment: 0 Edema Assessment Assessed: [name] [name]: No] [Right: No] [Left: Edema] [Right: :] Calf Left: Right: Point of Measurement: From Medial Instep 34 cm Ankle Left: Right: Point of Measurement: From Medial Instep 12 cm Vascular Assessment Pulses: Dorsalis Pedis Palpable: [Left:Yes] Electronic Signature(s) Signed: 08/25/2022 4:25:07 PM By: Adline Peals Entered By: Adline Peals on 08/25/2022 13:16:32 -------------------------------------------------------------------------------- Multi Wound Chart Details Patient Name: Date of Service: [name] [name], [name] NNA [name]. 08/25/2022 1:30 PM Medical Record Number: [id number] Patient Account  Number: [id number] Date of Birth/Sex: Treating RN: [birth year] (66 y.o. [name] [name] Primary Care Jmari Pelc: [name] Other Clinician: Referring Lucian Baswell: Treating Fatima Fedie/Extender: Maxwell Marion in Treatment: 0 Vital Signs Height(in): Pulse(bpm): 40 Weight(lbs): Blood Pressure(mmHg): 111/59 Body Mass Index(BMI): Temperature(F): 98.4 Respiratory Rate(breaths/min): 18 [16:Photos:] [N/A:N/A] Left Calcaneus N/A N/A Wound Location: Gradually Appeared N/A N/A Wounding Event: T be determined o N/A N/A Primary Etiology: Cataracts, Anemia, Sleep Apnea, N/A N/A Comorbid History: Coronary Artery Disease, Hypertension, Myocardial Infarction, Type II Diabetes, Osteoarthritis, Osteomyelitis, Neuropathy 10/31/2021 N/A N/A Date Acquired: 0 N/A N/A Weeks of Treatment: Open N/A N/A Wound Status: No N/A N/A Wound Recurrence: 0.9x1x0.3 N/A N/A Measurements L x W x D (cm) 0.707 N/A N/A A (cm) : rea 0.212 N/A N/A Volume (cm) : Full Thickness Without Exposed N/A N/A Classification: Support Structures Medium N/A N/A Exudate A mount: Serosanguineous N/A N/A Exudate Type: red, brown N/A N/A Exudate Color:  Distinct, outline attached N/A N/A Wound Margin: Large (67-100%) N/A N/A Granulation A mount: Red N/A N/A Granulation Quality: Small (1-33%) N/A N/A Necrotic A mount: Eschar N/A N/A Necrotic Tissue: Fat Layer (Subcutaneous Tissue): Yes N/A N/A Exposed Structures: Fascia: No Tendon: No Muscle: No Joint: No Bone: No None N/A N/A Epithelialization: Debridement - Selective/Open Wound N/A N/A Debridement: Pre-procedure Verification/Time Out 13:32 N/A N/A Taken: Lidocaine 4% T opical Solution N/A N/A Pain Control: Callus, Slough N/A N/A Tissue Debrided: Skin/Epidermis N/A N/A Level: 0.9 N/A N/A Debridement A (sq cm): rea Curette N/A N/A Instrument: Minimum N/A N/A Bleeding: Pressure N/A N/A Hemostasis A  chieved: Procedure was tolerated well N/A N/A Debridement Treatment Response: 0.9x1x0.3 N/A N/A Post Debridement Measurements L x W x D (cm) 0.212 N/A N/A Post Debridement Volume: (cm) Callus: Yes N/A N/A Periwound Skin Texture: Dry/Scaly: Yes N/A N/A Periwound Skin Moisture: No Abnormalities Noted N/A N/A Periwound Skin Color: No Abnormality N/A N/A Temperature: Debridement N/A N/A Procedures Performed: Treatment Notes Electronic Signature(s) Signed: 08/25/2022 1:49:21 PM By: Fredirick Maudlin MD FACS Signed: 08/25/2022 4:25:07 PM By: Adline Peals Entered By: Fredirick Maudlin on 08/25/2022 13:49:21 -------------------------------------------------------------------------------- Multi-Disciplinary Care Plan Details Patient Name: Date of Service: [name] [name], [name] NNA [name]. 08/25/2022 1:30 PM Medical Record Number: [id number] Patient Account Number: [id number] Date of Birth/Sex: Treating RN: Sep 28, 1956 (66 y.o. [name] [name] Primary Care Almer Littleton: [name] Other Clinician: Referring Gracee Ratterree: Treating Zilphia Kozinski/Extender: Maxwell Marion in Treatment: [address] JABRIA, LOOS [name] ([id number]) [id number] Page 6 of 9 Pressure Nursing Diagnoses: Potential for impaired tissue integrity related to pressure, friction, moisture, and shear Goals: Patient will remain free of pressure ulcers Date Initiated: 08/25/2022 Target Resolution Date: 10/06/2022 Goal Status: Active Interventions: Assess: immobility, friction, shearing, incontinence upon admission and as needed Assess offloading mechanisms upon admission and as needed Notes: Wound/Skin Impairment Nursing Diagnoses: Knowledge deficit related to ulceration/compromised skin integrity Goals: Patient/caregiver will verbalize understanding of skin care regimen Date Initiated: 08/25/2022 Target Resolution Date: 10/06/2022 Goal Status:  Active Interventions: Assess patient/caregiver ability to obtain necessary supplies Treatment Activities: Skin care regimen initiated : 08/25/2022 Topical wound management initiated : 08/25/2022 Notes: Electronic Signature(s) Signed: 08/25/2022 4:25:07 PM By: Adline Peals Entered By: Adline Peals on 08/25/2022 13:54:39 -------------------------------------------------------------------------------- Pain Assessment Details Patient Name: Date of Service: [name] [name], [name] NNA [name]. 08/25/2022 1:30 PM Medical Record Number: [id number] Patient Account Number: [id number] Date of Birth/Sex: Treating RN: 10-Sep-1956 (66 y.o. [name] [name] Primary Care Lily Velasquez: [name] Other Clinician: Referring Sabina Beavers: Treating Delsin Copen/Extender: Maxwell Marion in Treatment: 0 Active Problems Location of Pain Severity and Description of Pain Patient Has Paino Yes Site Locations Pain LocationELLAN, TESS ([id number]) [id number] Page 7 of 9 Pain Location: Pain in Ulcers Duration of the Pain. Constant / Intermittento Intermittent Rate the pain. Current Pain Level: 6 Character of Pain Describe the Pain: Other: stinging Pain Management and Medication Current Pain Management: Electronic Signature(s) Signed: 08/25/2022 4:25:07 PM By: Adline Peals Entered By: Adline Peals on 08/25/2022 13:18:28 -------------------------------------------------------------------------------- Patient/Caregiver Education Details Patient Name: Date of Service: [name] [name], [name] NNA [name]. 10/27/2023andnbsp1:30 PM Medical Record Number: [id number] Patient Account Number: [id number] Date of Birth/Gender: Treating RN: 06-28-1956 (66 y.o. [name] [name] Primary Care Physician: [name] Other Clinician: Referring Physician: Treating Physician/Extender: Maxwell Marion in Treatment: 0 Education  Assessment Education Provided To: Patient Education Topics Provided Wound/Skin Impairment: Methods: Explain/Verbal Responses: Reinforcements needed, State content correctly Electronic Signature(s) Signed:  08/25/2022 4:25:07 PM By: Adline Peals Entered By: Adline Peals on 08/25/2022 13:54:52 -------------------------------------------------------------------------------- Wound Assessment Details Patient Name: Date of Service: [place], [name] NNA [name]. 08/25/2022 1:30 PM Medical Record Number: [id number] Patient Account Number: [id number] [name] [name], [name] [name] ([id number]) [id number] Page 8 of 9 Date of Birth/Sex: Treating RN: [birth year] (66 y.o. [name] [name] Primary Care Jaima Janney: Other Clinician: [name] Referring Everlynn Sagun: Treating Kieth Hartis/Extender: Maxwell Marion in Treatment: 0 Wound Status Wound Number: 53 Primary T be determined o Etiology: Wound Location: Left Calcaneus Wound Open Wounding Event: Gradually Appeared Status: Date Acquired: 10/31/2021 Comorbid Cataracts, Anemia, Sleep Apnea, Coronary Artery Disease, Weeks Of Treatment: 0 History: Hypertension, Myocardial Infarction, Type II Diabetes, Clustered Wound: No Osteoarthritis, Osteomyelitis, Neuropathy Photos Wound Measurements Length: (cm) 0.9 Width: (cm) 1 Depth: (cm) 0.3 Area: (cm) 0.707 Volume: (cm) 0.212 % Reduction in Area: % Reduction in Volume: Epithelialization: None Tunneling: No Undermining: No Wound Description Classification: Full Thickness Without Exposed Support Structures Wound Margin: Distinct, outline attached Exudate Amount: Medium Exudate Type: Serosanguineous Exudate Color: red, brown Foul Odor After Cleansing: No Slough/Fibrino No Wound Bed Granulation Amount: Large (67-100%) Exposed Structure Granulation Quality: Red Fascia Exposed: No Necrotic Amount: Small (1-33%) Fat Layer (Subcutaneous Tissue)  Exposed: Yes Necrotic Quality: Eschar Tendon Exposed: No Muscle Exposed: No Joint Exposed: No Bone Exposed: No Periwound Skin Texture Texture Color No Abnormalities Noted: No No Abnormalities Noted: Yes Callus: Yes Temperature / Pain Temperature: No Abnormality Moisture No Abnormalities Noted: No Dry / Scaly: Yes Treatment Notes Wound #16 (Calcaneus) Wound Laterality: Left Cleanser Soap and Water Discharge Instruction: May shower and wash wound with dial antibacterial soap and water prior to dressing change. Wound Cleanser Discharge Instruction: Cleanse the wound with wound cleanser prior to applying a clean dressing using gauze sponges, not tissue or cotton balls. Peri-Wound Care Topical [address] LANYA, BUCKS [name] ([id number]) [id number] Page 9 of 9 KerraCel Ag Gelling Fiber Dressing, 2x2 in (silver alginate) Discharge Instruction: Apply silver alginate to wound bed as instructed Secondary Dressing Optifoam Non-Adhesive Dressing, 4x4 in Discharge Instruction: Apply over primary dressing as directed. Woven Gauze Sponge, Non-Sterile 4x4 in Discharge Instruction: Apply over primary dressing as directed. Secured With [employer], 4.5x3.1 (in/yd) Discharge Instruction: Secure with Kerlix as directed. 35M Medipore H Soft Cloth Surgical T ape, 4 x 10 (in/yd) Discharge Instruction: Secure with tape as directed. Compression Wrap Compression Stockings Add-Ons Electronic Signature(s) Signed: 08/25/2022 4:25:07 PM By: Adline Peals Entered By: Adline Peals on 08/25/2022 13:17:42 -------------------------------------------------------------------------------- Vitals Details Patient Name: Date of Service: [name] [name], [name] NNA [name]. 08/25/2022 1:30 PM Medical Record Number: [id number] Patient Account Number: [id number] Date of Birth/Sex: Treating RN: 31-Jan-1956 (66 y.o. [name] [name] Primary Care Breyson Kelm: [name] Other  Clinician: Referring Kshawn Canal: Treating Aneliese Beaudry/Extender: Maxwell Marion in Treatment: 0 Vital Signs Time Taken: 13:08 Temperature (F): 98.4 Pulse (bpm): 64 Respiratory Rate (breaths/min): 18 Blood Pressure (mmHg): 111/59 Reference Range: 80 - 120 mg / dl Electronic Signature(s) Signed: 08/25/2022 4:25:07 PM By: Adline Peals Entered By: Adline Peals on 08/25/2022 13:09:02

## 2022-08-28 ENCOUNTER — Telehealth: Payer: Self-pay | Admitting: Family Medicine

## 2022-08-28 ENCOUNTER — Telehealth: Payer: Self-pay

## 2022-08-28 ENCOUNTER — Encounter: Payer: Self-pay | Admitting: Family

## 2022-08-28 ENCOUNTER — Other Ambulatory Visit: Payer: Self-pay | Admitting: Cardiology

## 2022-08-28 NOTE — Telephone Encounter (Signed)
Patient called stating that [facility] hasn't sent out dressing changes for central port. I informed patent that Dr.Campbell is no longer managing her port line due to her IV abx ending on 10/11. Any port maintenance orders her oncology doctor will need to address. Patient verbalized her understanding.   Left VM for [name], RN at [facility] her of to contact patient oncologist for port maintenance.    [place], CMA

## 2022-08-28 NOTE — Telephone Encounter (Signed)
Medication:   furosemide (LASIX) 20 MG tablet [[id number]]   Has the patient contacted their pharmacy? No. (If no, request that the patient contact the pharmacy for the refill.) (If yes, when and what did the pharmacy advise?)  Preferred Pharmacy (with phone number or street name):   [facility] [address] [place], [place] - [address], [place] [address] Phone: [phone]  Fax: [phone]   Agent: Please be advised that RX refills may take up to 3 business days. We ask that you follow-up with your pharmacy.

## 2022-08-29 ENCOUNTER — Other Ambulatory Visit: Payer: Self-pay | Admitting: Family Medicine

## 2022-08-29 MED ORDER — FUROSEMIDE 20 MG PO TABS: 40.0000 mg | ORAL_TABLET | Freq: Every day | ORAL | 3 refills | Status: DC

## 2022-08-29 NOTE — Telephone Encounter (Signed)
Med was last refilled in March and by a different provider. Is patient supposed to be on this med?

## 2022-08-31 ENCOUNTER — Encounter: Admit: 2022-08-31 | Discharge: 2022-08-31 | Payer: MEDICARE

## 2022-08-31 ENCOUNTER — Ambulatory Visit: Admit: 2022-08-31 | Discharge: 2022-09-01 | Payer: MEDICARE

## 2022-08-31 DIAGNOSIS — K589 Irritable bowel syndrome without diarrhea: Secondary | ICD-10-CM

## 2022-08-31 DIAGNOSIS — E1129 Type 2 diabetes mellitus with other diabetic kidney complication: Secondary | ICD-10-CM

## 2022-08-31 DIAGNOSIS — D649 Anemia, unspecified: Secondary | ICD-10-CM

## 2022-08-31 DIAGNOSIS — I10 Essential (primary) hypertension: Secondary | ICD-10-CM

## 2022-08-31 DIAGNOSIS — J45909 Unspecified asthma, uncomplicated: Secondary | ICD-10-CM

## 2022-08-31 DIAGNOSIS — F5104 Psychophysiologic insomnia: Secondary | ICD-10-CM

## 2022-08-31 DIAGNOSIS — J309 Allergic rhinitis, unspecified: Secondary | ICD-10-CM

## 2022-08-31 DIAGNOSIS — E119 Type 2 diabetes mellitus without complications: Secondary | ICD-10-CM

## 2022-08-31 DIAGNOSIS — G9050 Complex regional pain syndrome I, unspecified: Secondary | ICD-10-CM

## 2022-08-31 DIAGNOSIS — E785 Hyperlipidemia, unspecified: Secondary | ICD-10-CM

## 2022-08-31 DIAGNOSIS — E559 Vitamin D deficiency, unspecified: Secondary | ICD-10-CM

## 2022-08-31 DIAGNOSIS — N1832 Stage 3b chronic kidney disease (HCC): Secondary | ICD-10-CM

## 2022-08-31 DIAGNOSIS — K2970 Gastritis, unspecified, without bleeding: Secondary | ICD-10-CM

## 2022-08-31 DIAGNOSIS — M79642 Pain in left hand: Secondary | ICD-10-CM

## 2022-08-31 DIAGNOSIS — K5900 Constipation, unspecified: Secondary | ICD-10-CM

## 2022-08-31 DIAGNOSIS — G5600 Carpal tunnel syndrome, unspecified upper limb: Secondary | ICD-10-CM

## 2022-08-31 LAB — URINALYSIS DIPSTICK
GLUCOSE,UA: NEGATIVE
NITRITE: NEGATIVE
PROTEIN,UA: NEGATIVE
URINE ASCORBIC ACID, UA: NEGATIVE
URINE BILE: NEGATIVE
URINE BLOOD: NEGATIVE
URINE KETONE: NEGATIVE
URINE PH: 7 (ref 5.0–8.0)
URINE SPEC GRAVITY: 1 (ref 1.005–1.030)

## 2022-08-31 LAB — URINALYSIS, MICROSCOPIC

## 2022-08-31 MED ORDER — GLIMEPIRIDE 4 MG PO TAB
4 mg | ORAL_TABLET | Freq: Every morning | ORAL | 3 refills | Status: AC
Start: 2022-08-31 — End: ?

## 2022-08-31 MED ORDER — MIRTAZAPINE 15 MG PO TAB
15 mg | ORAL_TABLET | Freq: Every evening | ORAL | 3 refills | Status: AC
Start: 2022-08-31 — End: ?

## 2022-08-31 MED ORDER — CLONIDINE 0.1 MG/24 HR TD PTWK
1 | MEDICATED_PATCH | TRANSDERMAL | 11 refills | Status: AC
Start: 2022-08-31 — End: ?
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
## 2022-09-01 ENCOUNTER — Encounter (HOSPITAL_BASED_OUTPATIENT_CLINIC_OR_DEPARTMENT_OTHER): Payer: PRIVATE HEALTH INSURANCE | Attending: General Surgery | Admitting: General Surgery

## 2022-09-01 ENCOUNTER — Encounter: Admit: 2022-09-01 | Discharge: 2022-09-01 | Payer: MEDICARE

## 2022-09-01 DIAGNOSIS — N3090 Cystitis, unspecified without hematuria: Secondary | ICD-10-CM

## 2022-09-01 NOTE — Telephone Encounter
Call Christia - the Urinalysis looked fine - no sign of residual infection.  We also sent the urine for a culture and we will call her on Monday with that result when it is finalized.

## 2022-09-01 NOTE — Telephone Encounter
Pt notified.

## 2022-09-02 ENCOUNTER — Encounter: Admit: 2022-09-02 | Discharge: 2022-09-02 | Payer: MEDICARE

## 2022-09-05 ENCOUNTER — Telehealth: Payer: Self-pay

## 2022-09-05 NOTE — Telephone Encounter (Signed)
Call Type Provider Call Message Only Reason for Call Request to send message to Office Initial Comment Caller states that she has a pier to pier request. Additional Comment Selina from [facility] needing pier to pier and doctor availability for Dr. [name] and Dr. Durene Fruits. Callers phone [phone] ext [phone]. Patient name is [name] [name]. Disp. Time Disposition Final User 09/04/2022 6:07:05 PM General Information Provided Yes Essie Hart Call Closed By: Essie Hart Transaction Date/Time: 09/04/2022 6:01:40 PM (ET)

## 2022-09-06 ENCOUNTER — Inpatient Hospital Stay: Payer: PRIVATE HEALTH INSURANCE

## 2022-09-06 ENCOUNTER — Encounter: Payer: Self-pay | Admitting: Family

## 2022-09-06 ENCOUNTER — Inpatient Hospital Stay (HOSPITAL_BASED_OUTPATIENT_CLINIC_OR_DEPARTMENT_OTHER): Payer: PRIVATE HEALTH INSURANCE | Admitting: Family

## 2022-09-06 ENCOUNTER — Other Ambulatory Visit: Payer: Self-pay

## 2022-09-06 ENCOUNTER — Inpatient Hospital Stay: Payer: PRIVATE HEALTH INSURANCE | Attending: Hematology & Oncology

## 2022-09-06 ENCOUNTER — Encounter: Admit: 2022-09-06 | Discharge: 2022-09-06 | Payer: MEDICARE

## 2022-09-06 VITALS — BP 160/73 | HR 70 | Temp 98.3°F | Resp 17 | Ht 59.06 in

## 2022-09-06 DIAGNOSIS — Z79899 Other long term (current) drug therapy: Secondary | ICD-10-CM | POA: Insufficient documentation

## 2022-09-06 DIAGNOSIS — D631 Anemia in chronic kidney disease: Secondary | ICD-10-CM | POA: Insufficient documentation

## 2022-09-06 DIAGNOSIS — N1832 Chronic kidney disease, stage 3b: Secondary | ICD-10-CM | POA: Diagnosis not present

## 2022-09-06 DIAGNOSIS — Z7901 Long term (current) use of anticoagulants: Secondary | ICD-10-CM | POA: Diagnosis not present

## 2022-09-06 DIAGNOSIS — D500 Iron deficiency anemia secondary to blood loss (chronic): Secondary | ICD-10-CM | POA: Diagnosis not present

## 2022-09-06 DIAGNOSIS — Z86718 Personal history of other venous thrombosis and embolism: Secondary | ICD-10-CM | POA: Diagnosis not present

## 2022-09-06 DIAGNOSIS — N183 Chronic kidney disease, stage 3 unspecified: Secondary | ICD-10-CM | POA: Diagnosis present

## 2022-09-06 DIAGNOSIS — D508 Other iron deficiency anemias: Secondary | ICD-10-CM

## 2022-09-06 LAB — CMP (CANCER CENTER ONLY)
ALT: 6 U/L (ref 0–44)
AST: 10 U/L — ABNORMAL LOW (ref 15–41)
Albumin: 3.6 g/dL (ref 3.5–5.0)
Alkaline Phosphatase: 78 U/L (ref 38–126)
Anion gap: 9 (ref 5–15)
BUN: 76 mg/dL — ABNORMAL HIGH (ref 8–23)
CO2: 17 mmol/L — ABNORMAL LOW (ref 22–32)
Calcium: 9.2 mg/dL (ref 8.9–10.3)
Chloride: 109 mmol/L (ref 98–111)
Creatinine: 4.66 mg/dL (ref 0.44–1.00)
GFR, Estimated: 10 mL/min — ABNORMAL LOW (ref >60)
Glucose, Bld: 95 mg/dL (ref 70–99)
Potassium: 4.6 mmol/L (ref 3.5–5.1)
Sodium: 135 mmol/L (ref 135–145)
Total Bilirubin: 0.5 mg/dL (ref 0.3–1.2)
Total Protein: 8.7 g/dL — ABNORMAL HIGH (ref 6.5–8.1)

## 2022-09-06 LAB — CBC WITH DIFFERENTIAL (CANCER CENTER ONLY)
Abs Immature Granulocytes: 0.07 10*3/uL (ref 0.00–0.07)
Basophils Absolute: 0.1 10*3/uL (ref 0.0–0.1)
Basophils Relative: 1 %
Eosinophils Absolute: 0.5 10*3/uL (ref 0.0–0.5)
Eosinophils Relative: 7 %
HCT: 32.1 % — ABNORMAL LOW (ref 36.0–46.0)
Hemoglobin: 10 g/dL — ABNORMAL LOW (ref 12.0–15.0)
Immature Granulocytes: 1 %
Lymphocytes Relative: 13 %
Lymphs Abs: 0.9 10*3/uL (ref 0.7–4.0)
MCH: 27.6 pg (ref 26.0–34.0)
MCHC: 31.2 g/dL (ref 30.0–36.0)
MCV: 88.7 fL (ref 80.0–100.0)
Monocytes Absolute: 0.5 10*3/uL (ref 0.1–1.0)
Monocytes Relative: 7 %
Neutro Abs: 5.2 10*3/uL (ref 1.7–7.7)
Neutrophils Relative %: 71 %
Platelet Count: 147 10*3/uL — ABNORMAL LOW (ref 150–400)
RBC: 3.62 MIL/uL — ABNORMAL LOW (ref 3.87–5.11)
RDW: 16.2 % — ABNORMAL HIGH (ref 11.5–15.5)
WBC Count: 7.2 10*3/uL (ref 4.0–10.5)
nRBC: 0 % (ref 0.0–0.2)

## 2022-09-06 LAB — SAMPLE TO BLOOD BANK

## 2022-09-06 LAB — RETICULOCYTES
Immature Retic Fract: 5.9 % (ref 2.3–15.9)
RBC.: 3.61 MIL/uL — ABNORMAL LOW (ref 3.87–5.11)
Retic Count, Absolute: 51.3 10*3/uL (ref 19.0–186.0)
Retic Ct Pct: 1.4 % (ref 0.4–3.1)

## 2022-09-06 LAB — FERRITIN: Ferritin: 401 ng/mL — ABNORMAL HIGH (ref 11–307)

## 2022-09-06 MED ORDER — EPOETIN ALFA-EPBX 40000 UNIT/ML IJ SOLN
40000.0000 [IU] | Freq: Once | INTRAMUSCULAR | Status: AC
  Administered 2022-09-06: 40000 [IU] via SUBCUTANEOUS
  Filled 2022-09-06: qty 1

## 2022-09-06 MED ORDER — SODIUM CHLORIDE 0.9% FLUSH: 3.0000 mL | Freq: Once | INTRAVENOUS | Status: DC | PRN

## 2022-09-06 MED ORDER — SODIUM CHLORIDE 0.9% FLUSH: 10.0000 mL | Freq: Once | INTRAVENOUS | Status: DC | PRN

## 2022-09-06 NOTE — Progress Notes (Signed)
Hematology and Oncology Follow Up Visit  [name] [name] [id number] [birth year] 66 y.o. 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 Hgb is up to 10.0, MCV 88, platelets 147 and WBC count 7.2.  She is doing fairly well. Her back is still healing post surgery and infection.  She is excited that her new shoes should be ready for pick up next week.  She ambulates with a Rolator. No falls or syncope reported today.  Swelling in her lower extremities is stable.  She does note fatigue. She has occasional dizziness and mild SOB with exertion.  No fever, chills, n/v, cough, rash, chest pain, palpitations, abdominal pain or changes in bowel or bladder habits.  Appetite and hydration are good. She could not stand for weight today.   ECOG Performance Status: 1 - Symptomatic but completely ambulatory  Medications:  Allergies as of 09/06/2022       Reactions   Bee Pollen Anaphylaxis   Broccoli [brassica Oleracea] Anaphylaxis, Hives   Fish-derived Products Anaphylaxis, Hives   Mushroom Extract Complex Anaphylaxis   Penicillins Anaphylaxis   **Tolerated cefepime March 2021 Did it involve swelling of the face/tongue/throat, SOB, or low BP? Yes Did it involve sudden or severe rash/hives, skin peeling, or any reaction on the inside of your mouth or nose? No Did you need to seek medical attention at a hospital or doctor's office? Yes When did it last happen?  A few months ago If all above answers are "NO", may proceed with cephalosporin use.   Rosemary Oil Anaphylaxis   Shellfish Allergy Anaphylaxis, Hives   Tomato Anaphylaxis,  Hives   Raw tomatoes only, can tolerate if cooked   Acetaminophen Other (See Comments)   GI upset   Aloe Vera Hives   Doxycycline Diarrhea   Acyclovir And Related Other (See Comments)   Unknown reaction   Naproxen Hives        Medication List        Accurate as of September 06, 2022  1:59 PM. If you have any questions, ask your nurse or doctor.          acetaminophen 325 MG tablet Commonly known as: TYLENOL Take 2 tablets (650 mg total) by mouth every 6 (six) hours as needed for mild pain, fever or headache (or Fever >/= 101). Discontinue 500/1000 mg dosing   Advair HFA 115-21 MCG/ACT inhaler Generic drug: fluticasone-salmeterol Inhale 2 puffs into the lungs 2 (two) times daily. What changed:  when to take this reasons to take this   albuterol (2.5 MG/3ML) 0.083% nebulizer solution Commonly known as: PROVENTIL Take 3 mLs (2.5 mg total) by nebulization every 6 (six) hours as needed for wheezing or shortness of breath.   albuterol 108 (90 Base) MCG/ACT inhaler Commonly known as: VENTOLIN HFA Inhale 2 puffs into the lungs every 6 (six) hours as needed for wheezing or shortness of breath.   amLODipine 5 MG tablet Commonly known as: NORVASC Take 1 tablet (5 mg total) by mouth daily.   apixaban 2.5 MG Tabs tablet Commonly known as: Eliquis Take 1 tablet (2.5 mg total) by  mouth 2 (two) times daily.   atorvastatin 10 MG tablet Commonly known as: LIPITOR Take 1 tablet (10 mg total) by mouth daily.   carvedilol 12.5 MG tablet Commonly known as: COREG Take 1 tablet (12.5 mg total) by mouth daily.   diazepam 5 MG tablet Commonly known as: VALIUM Take 1 tablet (5 mg total) by mouth every 12 (twelve) hours as needed for anxiety. What changed: when to take this   DULoxetine 60 MG capsule Commonly known as: CYMBALTA Take 1 capsule (60 mg total) by mouth daily.   ferrous sulfate 325 (65 FE) MG tablet Take 1 tablet (325 mg total) by mouth 2 (two) times daily with a  meal.   folic acid 1 MG tablet Commonly known as: FOLVITE Take 2 tablets by mouth once daily   FreeStyle Libre 14 Day Reader Kerrin Mo 1 Device by Does not apply route daily.   FreeStyle Libre 14 Day Sensor Misc 1 Device by Does not apply route daily.   furosemide 20 MG tablet Commonly known as: LASIX Take 2 tablets (40 mg total) by mouth daily.   HYDROcodone-acetaminophen 5-325 MG tablet Commonly known as: NORCO/VICODIN Take 1 tablet by mouth every 6 (six) hours as needed for moderate pain.   Lantus SoloStar 100 UNIT/ML Solostar Pen Generic drug: insulin glargine INJECT 8 UNITS SUBCUTANEOUSLY once a day   lubiprostone 24 MCG capsule Commonly known as: AMITIZA Take 1 capsule (24 mcg total) by mouth 2 (two) times daily with a meal. What changed:  when to take this reasons to take this   montelukast 10 MG tablet Commonly known as: SINGULAIR Take 1 tablet (10 mg total) by mouth at bedtime.   multivitamin with minerals Tabs tablet Take 1 tablet by mouth daily.   NONFORMULARY OR COMPOUNDED ITEM Power wheelchair  #1  as directed   ondansetron 8 MG tablet Commonly known as: ZOFRAN Take 0.5 tablets (4 mg total) by mouth every 8 (eight) hours as needed for nausea or vomiting.   pregabalin 25 MG capsule Commonly known as: LYRICA Take 1 capsule (25 mg total) by mouth 3 (three) times daily.   sodium bicarbonate 650 MG tablet Take 1 tablet (650 mg total) by mouth 2 (two) times daily.   tamsulosin 0.4 MG Caps capsule Commonly known as: FLOMAX Take 1 capsule (0.4 mg total) by mouth daily.   VITAMIN D PO Take 5,000 Units by mouth daily.        Allergies:  Allergies  Allergen Reactions   Bee Pollen Anaphylaxis   Broccoli [Brassica Oleracea] Anaphylaxis and Hives   Fish-Derived Products Anaphylaxis and Hives   Mushroom Extract Complex Anaphylaxis   Penicillins Anaphylaxis    **Tolerated cefepime March 2021 Did it involve swelling of the face/tongue/throat, SOB, or low  BP? Yes Did it involve sudden or severe rash/hives, skin peeling, or any reaction on the inside of your mouth or nose? No Did you need to seek medical attention at a hospital or doctor's office? Yes When did it last happen?  A few months ago If all above answers are "NO", may proceed with cephalosporin use.   Rosemary Oil Anaphylaxis   Shellfish Allergy Anaphylaxis and Hives   Tomato Anaphylaxis and Hives    Raw tomatoes only, can tolerate if cooked   Acetaminophen Other (See Comments)    GI upset   Aloe Vera Hives   Doxycycline Diarrhea   Acyclovir And Related Other (See Comments)    Unknown reaction   Naproxen Hives  Past Medical History, Surgical history, Social history, and Family History were reviewed and updated.  Review of Systems: All other 10 point review of systems is negative.   Physical Exam:  vitals were not taken for this visit.   Wt Readings from Last 3 Encounters:  07/08/22 194 lb 14.2 oz (88.4 kg)  03/25/22 160 lb (72.6 kg)  02/14/22 168 lb (76.2 kg)    Ocular: Sclerae unicteric, pupils equal, round and reactive to light Ear-nose-throat: Oropharynx clear, dentition fair Lymphatic: No cervical or supraclavicular adenopathy Lungs no rales or rhonchi, good excursion bilaterally Heart regular rate and rhythm, no murmur appreciated Abd soft, nontender, positive bowel sounds MSK no focal spinal tenderness, no joint edema Neuro: non-focal, well-oriented, appropriate affect Breasts: Deferred   Lab Results  Component Value Date   WBC 7.2 09/06/2022   HGB 10.0 (L) 09/06/2022   HCT 32.1 (L) 09/06/2022   MCV 88.7 09/06/2022   PLT 147 (L) 09/06/2022   Lab Results  Component Value Date   FERRITIN 394 (H) 07/26/2022   IRON 64 07/26/2022   TIBC 232 (L) 07/26/2022   UIBC 168 07/26/2022   IRONPCTSAT 28 07/26/2022   Lab Results  Component Value Date   RETICCTPCT 1.4 09/06/2022   RBC 3.61 (L) 09/06/2022   No results found for: "KPAFRELGTCHN", "LAMBDASER",  "KAPLAMBRATIO" No results found for: "IGGSERUM", "IGA", "IGMSERUM" No results found for: "TOTALPROTELP", "ALBUMINELP", "A1GS", "A2GS", "BETS", "BETA2SER", "GAMS", "MSPIKE", "SPEI"   Chemistry      Component Value Date/Time   NA 138 08/09/2022 1343   K 4.6 08/09/2022 1343   CL 108 08/09/2022 1343   CO2 21 (L) 08/09/2022 1343   BUN 61 (H) 08/09/2022 1343   CREATININE 4.21 (HH) 08/09/2022 1343   CREATININE 1.97 (H) 06/24/2020 1431      Component Value Date/Time   CALCIUM 9.3 08/09/2022 1343   ALKPHOS 84 08/09/2022 1343   AST 13 (L) 08/09/2022 1343   ALT 6 08/09/2022 1343   BILITOT 0.7 08/09/2022 1343       Impression and Plan: [name] [name] is a very pleasant 66 yo caucasian female with long standing history of iron deficiency anemia as well as history of DVT's in both the right and left peroneal and posterior tibial veins while she was bed bound in the hospital being treated for osteomyelitis.  She had an elevated homocystine level, mildly elevated anticardiolipin antibody IgG level and elevated D-dimer as well with lab work.  ESA given today for Hgb 10.0.  Iron studies are pending. We will replace if needed.   Lab check and injection every 3 weeks.  Follow-up in 9 weeks.  Today's CBC and CMP routed to her nephrologist Dr. Rondel Oh.   Lottie Dawson, NP 11/8/20231:59 PM

## 2022-09-06 NOTE — Patient Instructions (Signed)
Tunneled Catheter Insertion, Care After The following information offers guidance on how to care for yourself after your procedure. Your health care provider may also give you more specific instructions. If you have problems or questions, contact your health care provider. What can I expect after the procedure? After the procedure, it is common to have: Some mild redness, bruising, swelling, and pain around your catheter site. A small amount of blood or clear fluid coming from your incisions. Follow these instructions at home: Medicines Take over-the-counter and prescription medicines only as told by your health care provider. If you were prescribed an antibiotic medicine, take it as told by your health care provider. Do not stop taking the antibiotic even if you start to feel better. Incision care  Follow instructions from your health care provider about how to take care of your incisions. Make sure you: Wash your hands with soap and water for at least 20 seconds before and after you change your bandages (dressings). If soap and water are not available, use hand sanitizer. Change your dressings as told by your health care provider. Wash the area around your incisions with a germ-killing (antiseptic) solution when you change your dressings. Leave stitches (sutures), skin glue, or adhesive strips in place. These skin closures may need to stay in place for 2 weeks or longer. If adhesive strip edges start to loosen and curl up, you may trim the loose edges. Do not remove adhesive strips completely unless your health care provider tells you to do that. Keep your dressings clean and dry. Check your incision areas every day for signs of infection. Check for: More redness, swelling, or pain. More fluid or blood. Warmth. Pus or a bad smell. Catheter care  Wash your hands with soap and water for at least 20 seconds before and after caring for your catheter. If soap and water are not available, use  hand sanitizer. Keep your catheter site clean and dry. Apply an antibiotic ointment to your catheter site as told by your health care provider. Flush your catheter as told by your health care provider. This helps prevent it from becoming clogged. Leave thecaps on the ends of the catheter when not in use. Do not pull on your catheter. Activity Return to your normal activities as told by your health care provider. Ask your health care provider what activities are safe for you. Follow any other activity restrictions as instructed by your health care provider. Do not lift anything that is heavier than 10 lb (4.5 kg), or the limit that you are told, until your health care provider says that it is safe. Driving Do not drive until your health care provider approves. Ask your health care provider if the medicine prescribed to you requires you to avoid driving or using machinery. General instructions Follow your health care provider's specific instructions for the type of catheter that you have. Do not take baths, swim, or use a hot tub until your health care provider approves. Ask your health care provider if you may take showers. Keep all follow-up visits. This is important. Contact a health care provider if: You feel unusually weak or nauseous. You have a fever or chills. You have more redness, swelling, or pain at your incisions or around the area where your catheter has been inserted or where it exits. You have pus or a bad smell coming from your catheter site. Your catheter site feels warm to the touch. Your catheter is not working properly. Fluid is leaking from the  catheter, under the dressing, or around the dressing. You are unable to flush your catheter. Get help right away if: Your catheter develops a hole or it breaks. Your catheter comes loose or gets pulled completely out. If this happens, press on your catheter site firmly with a clean cloth until you can get medical help. You  develop bleeding from your catheter or your insertion site, and your bleeding does not stop. You have swelling in your shoulder, neck, chest, or face. You have pain or swelling when fluids or medicines are being given through the catheter. You have chest pain or difficulty breathing. These symptoms may represent a serious problem that is an emergency. Do not wait to see if the symptoms will go away. Get medical help right away. Call your local emergency services (911 in the [place] Do not drive yourself to the hospital. Summary After the procedure, it is common to have mild redness, swelling, and pain around your catheter site. Return to your normal activities as told by your health care provider. Ask your health care provider what activities are safe for you. Follow your health care provider's specific instructions for the type of catheter that you have. Keep your catheter site and your dressings clean and dry. Contact a health care provider if your catheter is not working properly. Get help right away if you have chest pain, difficulty breathing, or your catheter comes loose or gets pulled completely out. This information is not intended to replace advice given to you by your health care provider. Make sure you discuss any questions you have with your health care provider. Document Revised: 02/21/2021 Document Reviewed: 02/21/2021 Elsevier Patient Education  [place].

## 2022-09-06 NOTE — Patient Instructions (Signed)

## 2022-09-07 ENCOUNTER — Encounter: Payer: Self-pay | Admitting: Family Medicine

## 2022-09-07 ENCOUNTER — Ambulatory Visit (INDEPENDENT_AMBULATORY_CARE_PROVIDER_SITE_OTHER): Payer: PRIVATE HEALTH INSURANCE | Admitting: Family Medicine

## 2022-09-07 VITALS — BP 134/80 | HR 73 | Temp 98.3°F | Resp 18 | Ht 59.0 in

## 2022-09-07 DIAGNOSIS — M8610 Other acute osteomyelitis, unspecified site: Secondary | ICD-10-CM | POA: Diagnosis not present

## 2022-09-07 DIAGNOSIS — M48061 Spinal stenosis, lumbar region without neurogenic claudication: Secondary | ICD-10-CM

## 2022-09-07 DIAGNOSIS — Z981 Arthrodesis status: Secondary | ICD-10-CM

## 2022-09-07 DIAGNOSIS — M2550 Pain in unspecified joint: Secondary | ICD-10-CM

## 2022-09-07 DIAGNOSIS — E1165 Type 2 diabetes mellitus with hyperglycemia: Secondary | ICD-10-CM

## 2022-09-07 DIAGNOSIS — G894 Chronic pain syndrome: Secondary | ICD-10-CM

## 2022-09-07 DIAGNOSIS — I10 Essential (primary) hypertension: Secondary | ICD-10-CM

## 2022-09-07 DIAGNOSIS — Z794 Long term (current) use of insulin: Secondary | ICD-10-CM

## 2022-09-07 LAB — IRON AND IRON BINDING CAPACITY (CC-WL,HP ONLY)
Iron: 69 ug/dL (ref 28–170)
Saturation Ratios: 27 % (ref 10.4–31.8)
TIBC: 258 ug/dL (ref 250–450)
UIBC: 189 ug/dL (ref 148–442)

## 2022-09-07 MED ORDER — HYDROCODONE-ACETAMINOPHEN 5-325 MG PO TABS: 1.0000 | ORAL_TABLET | Freq: Four times a day (QID) | ORAL | 0 refills | Status: DC | PRN

## 2022-09-07 MED ORDER — CARVEDILOL 12.5 MG PO TABS: 12.5000 mg | ORAL_TABLET | Freq: Every day | ORAL | 3 refills | Status: DC

## 2022-09-07 NOTE — Patient Instructions (Signed)
Joint Pain Joint pain may be caused by many things. Joint pain is likely to go away when you follow instructions from your health care provider for relieving pain at home. However, joint pain can also be caused by conditions that require more treatment. Common causes of joint pain include: Bruising in the area of the joint. Injury caused by repeating certain movements too many times. Age-related joint wear and tear. Buildup of uric acid crystals in the joint (gout). Inflammation of the joint. Other forms of arthritis. Infections of the joint or of the bone. Your health care provider may recommend that you take pain medicine or wear a supportive device like an elastic bandage, sling, or splint. If your joint pain continues, you may need lab or imaging tests to diagnose the cause of your joint pain. Follow these instructions at home: Managing pain, stiffness, and swelling     If directed, put ice on the painful area. To do this: If you have a removable elastic bandage, sling, or splint, take it off as told by your doctor. Put ice in a plastic bag. Place a towel between your skin and the bag. Leave the ice on for 20 minutes, 2-3 times a day. Remove the ice if your skin turns bright red. This is very important. If you cannot feel pain, heat, or cold, you have a greater risk of damage to the area. Move your fingers and toes often to reduce stiffness and swelling. Raise the injured area above the level of your heart while you are sitting or lying down. If directed, apply heat to the painful area as often as told by your health care provider. Use the heat source that your health care provider recommends, such as a moist heat pack or a heating pad. Place a towel between your skin and the heat source. Leave the heat on for 20-30 minutes. Remove the heat if your skin turns bright red. This is especially important if you are unable to feel pain, heat, or cold. You have a greater risk of getting  burned.  Activity Rest as told by your health care provider. Do not do anything that causes or worsens pain. Begin exercising or stretching the affected area as told by your health care provider. Return to your normal activities as told by your health care provider. Ask your health care provider what activities are safe for you. If you have an elastic bandage, sling, or splint: Wear the bandage, sling, or splint as told by your health care provider. Remove it only as told by your health care provider. Loosen it if your fingers or toes below the joint tingle, become numb, or turn cold and blue. Keep it clean. Ask your health care provider if you should remove it before bathing. If the bandage, sling, or splint is not waterproof: Do not let it get wet. Cover it with a watertight covering when you take a bath or shower. General instructions Treatment may include medicines for pain and inflammation that are taken by mouth or applied to the skin. Take over-the-counter and prescription medicines only as told by your health care provider. Do not use any products that contain nicotine or tobacco, such as cigarettes, e-cigarettes, and chewing tobacco. If you need help quitting, ask your health care provider. Keep all follow-up visits. This is important. Contact a health care provider if: You have pain that gets worse and does not get better with medicine. Your joint pain does not improve within 3 days. You have increased  bruising or swelling. You have a fever. You lose 10 lb (4.5 kg) or more without trying. Get help right away if: You cannot move the joint. Your fingers or toes tingle, become numb, or turn cold and blue. You have a fever along with a joint that is red, warm, and swollen. Summary Joint pain may be caused by many things. Your health care provider may recommend that you take pain medicine or wear a supportive device such as an elastic bandage, sling, or splint. If your joint pain  continues, you may need tests to diagnose the cause of your joint pain. Take over-the-counter and prescription medicines only as told by your health care provider. This information is not intended to replace advice given to you by your health care provider. Make sure you discuss any questions you have with your health care provider. Document Revised: 01/28/2020 Document Reviewed: 01/28/2020 Elsevier Patient Education  [place].
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
## 2022-09-08 ENCOUNTER — Encounter (HOSPITAL_BASED_OUTPATIENT_CLINIC_OR_DEPARTMENT_OTHER): Payer: PRIVATE HEALTH INSURANCE | Admitting: General Surgery

## 2022-09-08 ENCOUNTER — Telehealth: Payer: Self-pay

## 2022-09-08 LAB — CBC WITH DIFFERENTIAL/PLATELET
Basophils Absolute: 0 10*3/uL (ref 0.0–0.1)
Basophils Relative: 0.5 % (ref 0.0–3.0)
Eosinophils Absolute: 0.5 10*3/uL (ref 0.0–0.7)
Eosinophils Relative: 7.2 % — ABNORMAL HIGH (ref 0.0–5.0)
HCT: 32.2 % — ABNORMAL LOW (ref 36.0–46.0)
Hemoglobin: 10.5 g/dL — ABNORMAL LOW (ref 12.0–15.0)
Lymphocytes Relative: 8.2 % — ABNORMAL LOW (ref 12.0–46.0)
Lymphs Abs: 0.5 10*3/uL — ABNORMAL LOW (ref 0.7–4.0)
MCHC: 32.5 g/dL (ref 30.0–36.0)
MCV: 86.7 fl (ref 78.0–100.0)
Monocytes Absolute: 0.6 10*3/uL (ref 0.1–1.0)
Monocytes Relative: 8.8 % (ref 3.0–12.0)
Neutro Abs: 4.9 10*3/uL (ref 1.4–7.7)
Neutrophils Relative %: 75.3 % (ref 43.0–77.0)
Platelets: 148 10*3/uL — ABNORMAL LOW (ref 150.0–400.0)
RBC: 3.71 Mil/uL — ABNORMAL LOW (ref 3.87–5.11)
RDW: 17.9 % — ABNORMAL HIGH (ref 11.5–15.5)
WBC: 6.5 10*3/uL (ref 4.0–10.5)

## 2022-09-08 LAB — COMPREHENSIVE METABOLIC PANEL
ALT: 6 U/L (ref 0–35)
AST: 10 U/L (ref 0–37)
Albumin: 3.7 g/dL (ref 3.5–5.2)
Alkaline Phosphatase: 85 U/L (ref 39–117)
BUN: 70 mg/dL — ABNORMAL HIGH (ref 6–23)
CO2: 19 mEq/L (ref 19–32)
Calcium: 8.6 mg/dL (ref 8.4–10.5)
Chloride: 107 mEq/L (ref 96–112)
Creatinine, Ser: 4.77 mg/dL (ref 0.40–1.20)
GFR: 9 mL/min — CL (ref >60.00)
Glucose, Bld: 84 mg/dL (ref 70–99)
Potassium: 5 mEq/L (ref 3.5–5.1)
Sodium: 134 mEq/L — ABNORMAL LOW (ref 135–145)
Total Bilirubin: 0.4 mg/dL (ref 0.2–1.2)
Total Protein: 8.3 g/dL (ref 6.0–8.3)

## 2022-09-08 LAB — VITAMIN B12: Vitamin B-12: 273 pg/mL (ref 211–911)

## 2022-09-08 LAB — VITAMIN D 25 HYDROXY (VIT D DEFICIENCY, FRACTURES): VITD: 52.82 ng/mL (ref 30.00–100.00)

## 2022-09-08 LAB — TSH: TSH: 1.79 u[IU]/mL (ref 0.35–5.50)

## 2022-09-08 NOTE — Telephone Encounter (Signed)
Faxed results to Lawson Radar, MD Nephrology office at [phone] [facility] per Dr. [name].

## 2022-09-08 NOTE — Telephone Encounter (Signed)
CRITICAL VALUE STICKER  CRITICAL VALUE: Creatinine 4.71, GFR 9.13  RECEIVER (on-site recipient of call): Manuela Schwartz, RMA  DATE & TIME NOTIFIED: 09/08/2022 @ 11:36 am  MESSENGER (representative from lab):  MD NOTIFIED: [name], DO  TIME OF NOTIFICATION: 11:55 am  RESPONSE:

## 2022-09-09 LAB — RHEUMATOID FACTOR: Rheumatoid fact SerPl-aCnc: <14 IU/mL (ref <14)

## 2022-09-09 LAB — ANA: Anti Nuclear Antibody (ANA): NEGATIVE

## 2022-09-11 ENCOUNTER — Ambulatory Visit (INDEPENDENT_AMBULATORY_CARE_PROVIDER_SITE_OTHER): Payer: PRIVATE HEALTH INSURANCE | Admitting: Podiatry

## 2022-09-11 DIAGNOSIS — Z91199 Patient's noncompliance with other medical treatment and regimen due to unspecified reason: Secondary | ICD-10-CM

## 2022-09-11 NOTE — Progress Notes (Signed)
   Complete physical exam  Patient: [name] [name]   DOB: 08/19/1999   66 y.o. Female  MRN: [id number]  Subjective:    No chief complaint on file.   [name] [name] is a 66 y.o. female who presents today for a complete physical exam. She reports consuming a {diet types:17450} diet. {types:19826} She generally feels {DESC; WELL/FAIRLY WELL/POORLY:18703}. She reports sleeping {DESC; WELL/FAIRLY WELL/POORLY:18703}. She {does/does not:200015} have additional problems to discuss today.    Most recent fall risk assessment:    04/26/2022   10:42 AM  Fall Risk   Falls in the past year? 0  Number falls in past yr: 0  Injury with Fall? 0  Risk for fall due to : No Fall Risks  Follow up Falls evaluation completed     Most recent depression screenings:    04/26/2022   10:42 AM 03/17/2021   10:46 AM  PHQ 2/9 Scores  PHQ - 2 Score 0 0  PHQ- 9 Score 5     {VISON DENTAL STD PSA (Optional):27386}  {History (Optional):23778}  Patient Care Team: Jessup, Joy, NP as PCP - General (Nurse Practitioner)   Outpatient Medications Prior to Visit  Medication Sig   fluticasone (FLONASE) 50 MCG/ACT nasal spray Place 2 sprays into both nostrils in the morning and at bedtime. After 7 days, reduce to once daily.   norgestimate-ethinyl estradiol (SPRINTEC 28) 0.25-35 MG-MCG tablet Take 1 tablet by mouth daily.   Nystatin POWD Apply liberally to affected area 2 times per day   spironolactone (ALDACTONE) 100 MG tablet Take 1 tablet (100 mg total) by mouth daily.   No facility-administered medications prior to visit.    ROS        Objective:     There were no vitals taken for this visit. {Vitals History (Optional):23777}  Physical Exam   No results found for any visits on 06/01/22. {Show previous labs (optional):23779}    Assessment & Plan:    Routine Health Maintenance and Physical Exam  Immunization History  Administered Date(s) Administered   DTaP 11/02/1999, 12/29/1999,  03/08/2000, 11/22/2000, 06/07/2004   Hepatitis A 04/03/2008, 04/09/2009   Hepatitis B 08/20/1999, 09/27/1999, 03/08/2000   HiB (PRP-OMP) 11/02/1999, 12/29/1999, 03/08/2000, 11/22/2000   IPV 11/02/1999, 12/29/1999, 08/27/2000, 06/07/2004   Influenza,inj,Quad PF,6+ Mos 07/10/2014   Influenza-Unspecified 10/09/2012   MMR 08/27/2001, 06/07/2004   Meningococcal Polysaccharide 04/08/2012   Pneumococcal Conjugate-13 11/22/2000   Pneumococcal-Unspecified 03/08/2000, 05/22/2000   Tdap 04/08/2012   Varicella 08/27/2000, 04/03/2008    Health Maintenance  Topic Date Due   HIV Screening  Never done   Hepatitis C Screening  Never done   INFLUENZA VACCINE  05/30/2022   PAP-Cervical Cytology Screening  06/01/2022 (Originally 08/18/2020)   PAP SMEAR-Modifier  06/01/2022 (Originally 08/18/2020)   TETANUS/TDAP  06/01/2022 (Originally 04/08/2022)   HPV VACCINES  Discontinued   COVID-19 Vaccine  Discontinued    Discussed health benefits of physical activity, and encouraged her to engage in regular exercise appropriate for her age and condition.  Problem List Items Addressed This Visit   None Visit Diagnoses     Annual physical exam    -  Primary   Cervical cancer screening       Need for Tdap vaccination          No follow-ups on file.     Joy Jessup, NP   

## 2022-09-12 NOTE — Assessment & Plan Note (Signed)
Per endo °

## 2022-09-12 NOTE — Assessment & Plan Note (Signed)
Con't pain meds

## 2022-09-13 ENCOUNTER — Telehealth: Payer: Self-pay

## 2022-09-13 NOTE — Telephone Encounter (Signed)
Patient called requesting an order to be placed  to have her port removed Patient states oncology states that they will no long be using the patient's port. Please advise.  Colsen Modi T Brooks Sailors

## 2022-09-14 ENCOUNTER — Other Ambulatory Visit: Payer: Self-pay | Admitting: Internal Medicine

## 2022-09-14 ENCOUNTER — Encounter: Admit: 2022-09-14 | Discharge: 2022-09-14 | Payer: MEDICARE

## 2022-09-14 DIAGNOSIS — M4625 Osteomyelitis of vertebra, thoracolumbar region: Secondary | ICD-10-CM

## 2022-09-14 NOTE — Research Notes
Research Informed Consent Note    NAME: Rhonda Fletcher             MRN: [id number]             DOB:Aug 13, 1956          AGE: 66 y.o.    IRB Number: [id number]  Consent Approval Dates: 06/20/2022 - 06/20/2023    Clinical research participation and research nature of the trial were discussed with subject. Participant was consented remotely following the IRB approved remote consenting process.The subject was alert and oriented during consent discussion. Subject was informed that study is voluntary and  she may withdraw consent at any time for any reason by notifying study team.  Study purpose, procedures, benefits, risks and duration of the study, confidentiality information and compensation were discussed.  Alternatives to participation were discussed per consent form.  Subject verbalized understanding.    Subject was provided time to review the consent form.  All questions asked were answered.  Subject voiced desire to participate in the study and signed the informed consent form without coercion and undue influence.  A copy of the signed consent was given to the subject as well as contact information for the study team.  A copy of the consent form was e-mailed to [facility] for scanning into the subject's medical record.    No research procedures took place prior to consenting.

## 2022-09-14 NOTE — Telephone Encounter (Signed)
Patient scheduled with IR 09/19/22. Patient informed of the appointment information and verbalized understanding.  [name] [name]

## 2022-09-15 ENCOUNTER — Encounter (HOSPITAL_BASED_OUTPATIENT_CLINIC_OR_DEPARTMENT_OTHER): Payer: PRIVATE HEALTH INSURANCE | Attending: General Surgery | Admitting: General Surgery

## 2022-09-15 DIAGNOSIS — N183 Chronic kidney disease, stage 3 unspecified: Secondary | ICD-10-CM | POA: Insufficient documentation

## 2022-09-15 DIAGNOSIS — I130 Hypertensive heart and chronic kidney disease with heart failure and stage 1 through stage 4 chronic kidney disease, or unspecified chronic kidney disease: Secondary | ICD-10-CM | POA: Insufficient documentation

## 2022-09-15 DIAGNOSIS — I509 Heart failure, unspecified: Secondary | ICD-10-CM | POA: Diagnosis not present

## 2022-09-15 DIAGNOSIS — E11621 Type 2 diabetes mellitus with foot ulcer: Secondary | ICD-10-CM | POA: Insufficient documentation

## 2022-09-15 DIAGNOSIS — E1165 Type 2 diabetes mellitus with hyperglycemia: Secondary | ICD-10-CM | POA: Insufficient documentation

## 2022-09-15 DIAGNOSIS — E1169 Type 2 diabetes mellitus with other specified complication: Secondary | ICD-10-CM | POA: Diagnosis not present

## 2022-09-15 DIAGNOSIS — Z86718 Personal history of other venous thrombosis and embolism: Secondary | ICD-10-CM | POA: Insufficient documentation

## 2022-09-15 DIAGNOSIS — M4625 Osteomyelitis of vertebra, thoracolumbar region: Secondary | ICD-10-CM | POA: Insufficient documentation

## 2022-09-15 DIAGNOSIS — L97422 Non-pressure chronic ulcer of left heel and midfoot with fat layer exposed: Secondary | ICD-10-CM | POA: Insufficient documentation

## 2022-09-15 DIAGNOSIS — E1142 Type 2 diabetes mellitus with diabetic polyneuropathy: Secondary | ICD-10-CM | POA: Insufficient documentation

## 2022-09-16 NOTE — Progress Notes (Signed)
CARRYE, GOLLER [name] ([id number]) [id number] Page 1 of 16 Visit Report for 09/15/2022 Chief Complaint Document Details Patient Name: Date of Service: [name] [name] RE, [place] NNA [name]. 09/15/2022 2:45 PM Medical Record Number: [id number] Patient Account Number: [id number] Date of Birth/Sex: Treating RN: [birth year] (66 y.o. F) Primary Care Provider: [name] Other Clinician: Referring Provider: Treating Provider/Extender: Maxwell Marion in Treatment: 3 Information Obtained from: Patient Chief Complaint Left heel ulcer 01/10/2021; patient returns to clinic with 2 large wounds on her bilateral heels 11/29/2021; patient returns to clinic with substantial wounds on the right foot and left heel. 08/25/2022: Patient returns to clinic with persistence of the left heel wound Electronic Signature(s) Signed: 09/15/2022 4:47:23 PM By: Fredirick Maudlin MD FACS Entered By: Fredirick Maudlin on 09/15/2022 16:47:23 -------------------------------------------------------------------------------- Debridement Details Patient Name: Date of Service: [name], [name] NNA [name]. 09/15/2022 2:45 PM Medical Record Number: [id number] Patient Account Number: [id number] Date of Birth/Sex: Treating RN: [birth year] (66 y.o. F) Primary Care Provider: [name] Other Clinician: Referring Provider: Treating Provider/Extender: Maxwell Marion in Treatment: 3 Debridement Performed for Assessment: Wound #16 Left Calcaneus Performed By: Physician Fredirick Maudlin, MD Debridement Type: Debridement Level of Consciousness (Pre-procedure): Awake and Alert Pre-procedure Verification/Time Out Yes - 15:40 Taken: Start Time: 15:40 Pain Control: Lidocaine 4% T opical Solution T Area Debrided (L x W): otal 1 (cm) x 1.1 (cm) = 1.1 (cm) Tissue and other material debrided: Non-Viable, Callus, Slough, Skin: Epidermis, Slough Level:  Skin/Epidermis Debridement Description: Selective/Open Wound Instrument: Curette Bleeding: Minimum Hemostasis Achieved: Pressure Procedural Pain: 0 Post Procedural Pain: 0 Response to Treatment: Procedure was tolerated well Level of Consciousness (Post- Awake and Alert procedure): Post Debridement Measurements of Total Wound Length: (cm) 1 Width: (cm) 1.1 Depth: (cm) 0.2 Volume: (cm) 0.173 Character of Wound/Ulcer Post Debridement: Improved DIANNAH, [name] ([id number]) [id number] Page 2 of 16 Post Procedure Diagnosis Same as Pre-procedure Notes scribed for Dr. Celine Ahr by Baruch Gouty, RN Electronic Signature(s) Signed: 09/15/2022 4:47:13 PM By: Fredirick Maudlin MD FACS Entered By: Fredirick Maudlin on 09/15/2022 16:47:13 -------------------------------------------------------------------------------- HPI Details Patient Name: Date of Service: [name], [name] NNA [name]. 09/15/2022 2:45 PM Medical Record Number: [id number] Patient Account Number: [id number] Date of Birth/Sex: Treating RN: 1955-11-21 (66 y.o. F) Primary Care Provider: [name] Other Clinician: Referring Provider: Treating Provider/Extender: Maxwell Marion in Treatment: 3 History of Present Illness HPI Description: ADMISSION 11/12/2018 This is a 66 year old woman with type 2 diabetes and diabetic neuropathy. She has been dealing with a left heel plantar wound for roughly 2 years. She states this started when she pulled some skin off the area and it progressed into a wound. She also has an area on the tip of her left great toe for 1 year. She has been largely followed by Dr. Sharol Given and she had an excision of bone in the left heel in 2017. I do not see microbiology from this excision or pathology. Apparently this wound never really closed. She most recently has been using Silvadene cream. Offloading this with a scooter. The last MRI I see was in June 2018  which did not show osteomyelitis at that time. Apparently this wound is never really progressed towards healing. During her last review by Dr. Sharol Given in October it was recommended that she undergo a left BKA and she refused. She went to see a second orthopedic consult at [facility] who am recommended conservative wound care to see if this will close or  progress towards closure but also warned that possible surgery may be necessary. An x-ray that was done at [facility] showed an irregular calcaneal body and calcaneal tuberosity. This is probably postprocedural. Previous x-rays at [facility] had suggested heterotrophic calcifications but I do not see this. Apparently a second ointment was added to the Silvadene which the patient thinks is because some improvement. She has not been systemically unwell. No fever or chills. She thinks the second ointment that was given to her at [facility] has helped somewhat. 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 ABIs done in June 2018 showed a ABI of 1.12 on the right and 1.1 on the left she was biphasic bilaterally. ABIs in our clinic were noncompressible today. 11/20/18 on evaluation today patient is seen for her second visit here in the office although this is actually the first visit with me concerning an issue that she's having with her great toe and heel of the left foot. Fortunately she does not appear to be have any discomfort at this time and she does have a scooter in order to offload her foot she also has a boot for offloading. With that being said this is something that appears to have been going on for some time she was seeing Dr. due to his recommendation was that she  was going to require a below knee amputation. With that being said she wanted to come to the wound center but according to the patient Dr. Sharol Given told her that "we could not help her". Nonetheless she saw another provider who suggested that it may be worth a shot for [name] to try and help her out if it all possible. Nonetheless she decided that it would be worth a trial since otherwise any the way she's gonna end up with an amputation. Obviously if we can heal the wound then that will not be the case. Again I explained to her that obviously there are no guarantees but we will give this a good try and attempts to get the wound to heal. 1/30; the patient continues to have areas on the left plantar heel and the left plantar great toe. The more worrisome area is the heel. She went for MRI on Saturday but this could not be done because she had silver alginate in the wound bed. I would like to get this rebooked. If she does not have osteomyelitis she will need a total contact cast. We have been using silver alginate in the wounds 2/7; patient has wounds on her left plantar heel and left plantar great toe. The area on the heel has some depth although it looks about the same today. Her MRI will finally be done tomorrow. If she has osteomyelitis in the heel and then we will need to consider her for IV antibiotics and hyperbaric oxygen. If the MRI is negative she will need a total contact cast although she is wearing a cam boot and using a scooter at present. Will be using silver alginate. She will take it off tomorrow in preparation for the MRI 2/14; the patient's MRI showed extensive surgical changes with a large portion of her calcaneus removed on the left secondary to her previous surgery by Dr. Sharol Given however there  is no evidence of osteomyelitis. She would therefore be a candidate for a total contact cast and we applied this for the first time today 2/21; silver collagen total contact cast. The area on the  plantar left great toe is "healed" still a lot of callus on this area. Dimensions on the plantar heel not too much different some epithelialization is present however. This is an improvement 12/25/18 on evaluation today patient actually is seen for follow-up concerning issues that she has been having with her cast she states she feels like it's wet and squishy in the bottom of her cast. The reason she does come in to have this evaluated and see what is going on. Fortunately there's no signs of infection at this time was the cast was removed. 3/6; the patient's area on the tip of her right great toe is callused but there is no open area here. She still has the fairly extensive area in the left heel. We have been using silver collagen in the wound. 01/08/19 on evaluation today patient actually appears to be doing very well in regard to her heel ulcer in my pinion to see if you shown signs of improvement which is excellent news. There's no evidence of infection. She continues to have quite a bit of drainage but fortunately again this doesn't seem to be hindering her healing. 3/20 -Patient's foot ulcer on the left appears to be doing well, the dimensions appear encouraging, we have been using total contact cast with Prisma and will continue doing that 3/27; patient continues to make nice progress on the left plantar heel. She is using silver collagen under a total contact cast. It is been a while since I have AMBRIELLA, KITT [name] ([id number]) [id number] Page 3 of 16 seen this wound and it really looks a lot better. Smaller with healthy granulation 4/3; she continues to make nice progress on the left plantar heel. Using silver collagen under a total contact cast 4/10; left plantar heel. Again skin over the surface of the wound with not much in the way of adherence. This results in undermining. Using silver collagen changed to silver alginate 4/17 left plantar heel. Again not much  improvement. There is no undermining today no debridement was required we used silver alginate last time because of excessive moisture 4/24 left plantar heel. Again not as much improvement as I would have liked. About 3 mm of depth. Thick callused skin around the circumference. Using silver alginate 5/1; left plantar heel this is improved this week. Less depth epithelialization is present. Using silver alginate alginate under a total contact cast 5/8; left plantar heel dimensions are about the same. The depth appears to be improved. I use silver collagen starting today under the cast 5/15 left plantar heel. Arrives today with a 2-day history of feeling like something had "slipped" while walking her dog in the cast. Unfortunately extensive area relatively to the small wound of undermining superiorly denuded epithelium. 5/22-Patient returns at 1 week after being taken off the T[facility] on account of some fluid collection that was debrided and cultured at last visit from the plantar ulcer on the left heel. The culture results are polymicrobial with Klebsiella, enterococcus, Proteus growth these organisms are sensitive to Cipro except with enterococcus which is sensitive to ampicillin but patient is highly allergic to penicillin according to her. 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 5/29; the patient arrived last  week with a new wound on her left plantar great toe. With regards to the culture that I did 2 weeks ago of her deteriorating heel wound this grew Klebsiella and enterococcus. She was given Cipro however the enterococcus would not be covered well by a quinolone. She is allergic to penicillin. I will give her linezolid 600 twice daily x5 days 6/12; the patient continues to have a wound on her left and after she returns to the beach there is undermining laterally. She has the new wound from this 2 weeks or so ago  on the plantar tip of her left great toe. She had a fall today scraping the dorsal surface of the left fifth 7/14; READMISSION since the patient was last here she was hospitalized from 04/15/2019 through 04/22/2019. She had presented to an outside ER after falling and hitting her elbow. She had had previous surgery on the elbow and fractures several years ago. An x-ray was negative and she was sent home. She is readmitted with sepsis and acute renal failure secondary to septic arthritis of the elbow. Cultures apparently showed pansensitive staph aureus however she has a severe beta-lactam allergy. She was treated with vancomycin. Apparently the vancomycin is completed and her PICC line is removed although she is seeing Dr. Megan Salon tomorrow due to continued pain and swelling. In the hospital she had an IandD by Dr. [name] of orthopedics. The original surgery was on 04/17/2019 and it was felt that she had septic arthritis. She is still having a lot of pain and swelling in the right elbow and apparently is due to see Dr. Megan Salon tomorrow and what she thinks is that she will be restarted on antibiotics. With regards to her heel wounds/left foot wounds. Her arterial studies were checked and her ABIs were within normal limits. X-ray showed plantar foot ulcer negative for osteomyelitis postop resection of the posterior calcaneus. She has been using silver alginate to the heel 8/4-Patient returns after being seen on 7/14, we are using silver alginate to the calcaneal wound, she is continuing to receive care for her right elbow septic arthritis 8/14- Patient returns after 1 week, she is being seen by the surgical group for her right elbow, she is here for the left calcaneal wound for which we are using silver alginate this is about the same 8/21; this is a patient I readmitted to the clinic 5 weeks ago. She is continuing to have difficulties with a septic arthritis of the right elbow she had after a fall and  apparently has had surgical IandD's since the last time we saw her. She is also following with Dr. Megan Salon of infectious disease and is apparently on 3 oral antibiotics although at the time of this dictation I am not sure what they are. She comes in today with a necrotic surface on the left great toe with a blister laterally. This was still clearly an open wound. She had purulent drainage coming out of this. The original wound on the plantar aspect of her right heel in the setting of a Charcot foot is deep not open to bone but certainly not any better at all. We have been using silver alginate 8/28; dealing with septic arthritis of the right elbow. May need to go for further surgery here in the second week of September. She had a blister on the left great toe that was purulent [place] draining last week although a culture did not grow anything [already on antibiotics through infectious disease for her elbow]. The  punched-out area on her heel is just like it was when she first came in. This almost closed with a total contact cast there are no options for that now. The patient states she cannot stay off her foot having to do housework X-ray of the foot showed a moderate bunion and severe degenerative changes at the first metatarsal phalangeal joint there was a moderate plantar calcaneal spur. We will need to check the location of this. 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 It is oral. We are using silver collagen to the punched-out area on her left heel and to the summer superficial area of the left great toe. She is offloading this is best she can although judging by the amount of callus it is not enough. She thinks she has enough strength in her right elbow now to use her scooter. There is not an option for a cast 9/18; still dealing with septic  arthritis of the elbow she is apparently going for an MRI and a possible procedure next week she is on clindamycin and I have reviewed this with Dr. Hale Bogus notes and infectious disease. We are using silver collagen to the punched-out areas on her left heel and to the plantar aspect of her left great toe she is now using her scooter to get around and to help offload these areas 10/2; 2-week follow-up. Still on clindamycin I believe for the left elbow she is going for an MRI of the elbow over the weekend. She is then going to see orthopedics and infectious disease. The area on the left heel is certainly no better. This does not probe to bone however there is green drainage. She has an area on the tip of her toe. The area on the heel is in a wound we almost closed at one point with total contact casting. 10/9; one-week follow-up. Culture I did of the heel last week which was a swab culture admittedly showed moderate Enterococcus faecalis moderate Pseudomonas. The wound itself looks about the same. There is no palpable bone although the depth of it closely approximates bone. The heel is swollen but not erythematous. Her MRI is booked for next week. She also has an MRI of the right elbow. I've also lifted the last consult from Dr. Megan Salon who is following her for septic arthritis of the right elbow. I did not see him specifically comment on her left heel however the patient states that he is aware of this. I did not specifically address the organisms I cultured last week because of the possible effect of any additional cultures that will be done on the elbow. Additionally these were superficial not bone cultures 10/16; her MRI of the heel was put back to next week. She did have her elbow done. I have been in contact with Dr. Megan Salon about my concerns about osteomyelitis of the left heel. We have been using collagen. She also has a wound on the plantar tip of her first toe 10/23; her MRI of the heel  was negative for osteomyelitis but suggested a small abscess. She also had an MRI of her elbow that showed findings compatible with a septic joint. She went to see Dr. Megan Salon and she is going to have both oral and IV antibiotics which I am sure will cover any infection in the heel. I did not  actually see his note today. We are using silver collagen offloading the heel with a heel offloading boot 11/6; patient continues with silver collagen to both wound areas on her plantar left heel and plantar left great toe. She remains on daptomycin by Dr. Megan Salon of infectious disease. She complains of diarrhea. Dr. Megan Salon is aware of this. 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 she has been put on oral vancomycin I think a lot of high clinical suspicion for pseudomembranous colitis Because of the right elbow there are no options to completely off her left foot beyond the heel offloading boot we have now. Fortunately the left heel ulcer itself has remained static some improvement in the plantar left great toe 11/20; patient arrives for review of her left heel ulcer. I also note she has a difficult time with septic arthritis of the right elbow. She currently is on IV daptomycin which seems to have helped the drainage in her elbow [MSSA]. Because of high concern for pseudomembranous colitis she was also put on vancomycin orally and Flagyl orally. She was on Levaquin but that was discontinued because of the concern about C. difficile. She states her diarrhea is better. She arrives in clinic today with a large area of denuded skin on the lateral part of the heel. This had totally separated. We have been using silver alginate to the wound areas. She arrived in a scooter telling me she is offloading the foot is much as possible. 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 Dr. Megan Salon stopped the daptomycin and now has her on vancomycin 1000 mg IV daily. This is supposed to go onto 1/18. She is being referred to [facility] for what sounds like a elbow replacement type operation for her MSSA septic arthritis. CALLAWAY, HARDIGREE [name] ([id number]) [id number] Page 4 of 16 She arrives in clinic today with a blister on the medial part of the wound on her heel. She still has a lot of callus around the heel although the surface of the wound on the left heel looks somewhat better. READMISSION 03/19/2020 Mrs. Maybee is a 66 year old woman we have followed for almost all of 2020 with a neuropathic wound on her left heel and the tip of her left great toe.. We she had previously had a partial calcanectomy by Dr. Sharol Given because of underlying osteomyelitis. She also had a history of MRSA even when we admitted her to the clinic. An MRI when she first came into our clinic did not show osteomyelitis. We put her in a total contact cast and gradually this contracted to the point it was almost closed however in that same timeframe she had a fall. 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 We had to stop putting her in a cast partially because of infection and partially because she could not support herself with the elbow. Things went progressively downhill. When we last saw her at the end of December she had a large open wound on the left heel. She had had recurrent infections in the right elbow. It was felt that either the heel lift: Ice her elbow or vice versa. We never had MRSA I do not believe cultured  in the left heel however. 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 She underwent an operative  debridement and a T11-L3 fusion. An operative culture grew Pseudomonas. She was treated with cefepime and required readmission to the hospital with acute kidney failure requiring temporary dialysis. She was discharged to rehab I believe she signed herself out [place]. He has been following with her primary doctor and she comes in the clinic today with miraculously the left heel totally closed. She is followed up with Dr. Megan Salon of infectious disease he does not feel she has any evidence of infection in her elbow or her heel for that matter and the heel is actually fully epithelialized. She is on ciprofloxacin 500 twice daily I think mostly directed at Pseudomonas. The patient is convinced that it was the treatment of the Pseudomonas in her back that ultimately led to closure of her heel and that certainly possible or could have been because she was nonambulatory in the hospital for 3 months according to her. She is still using her heel offloading boot and she says she is "learning to walk again" Readmission: 06/09/2020 patient presents today for reevaluation here in the clinic following a reopening of her heel ulcer. She tells [name] that this actually occurred about 2 weeks after she was last seen in May of this year though it is now the beginning of August and she has not come back until now to see [name]. Nonetheless she does have an open wound there is been no x-rays at this time infectious disease has been monitoring him following this up until this point. They made the recommendation that she come to see [name]. Fortunately there is no signs of active infection systemically though she does tell me that she had Pseudomonas in the spine when she had her back surgery she is currently on antibiotics for that. 8/20; this is a patient who I saw 1 time in late May. Apparently shortly after she was here the area on her plantar left heel started to drain again and she had an open wound. She has been  going to her primary physician and has been following with Dr. Megan Salon. 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 She is currently on 3 times a day prophylactic ciprofloxacin. She is admitted to the clinic last week for review of the wound on the left heel. Notable for the fact that she had a partial calcanectomy by Dr. Sharol Given 3 or 4 years ago. She says she is using her motorized wheelchair again as an off loader but she came in in regular shoes. 9/2; 2-week follow-up. Plantar left heel. In spite of debridement last week she had thick very adherent callus around the circumference of the wound and a nonviable gray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 this was removed, the overall wound size appeared smaller. The left calcaneal wound has a much better appearance this week, particularly the periwound skin. There was some slough on the wound surface but this peeled away to reveal nice granulation tissue. 03/10/2022: The patient has been absent from clinic due to having eye surgery. Today, the right plantar foot wound has healed. The left calcaneal wound has a fair amount of accumulated slough and is a little bit deeper. 03/31/2022: The right foot wound remains closed. The left wound is quite a bit shallower and smaller today. There is some slough and periwound callus accumulation. Is7/14; right foot remains closed. The area on the left plantar heel toe open. There is necrotic material on the surface and periwound callus. 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 She returns today with her left heel ulcer remaining open. It is the same, in terms of dimensions as it was prior to her back issues presenting. There is thick callus, slough, and eschar accumulation. No malodor or purulent drainage. She no longer walks and rides in a motorized scooter. 09/15/2022: The wound is about the same size. There is some callus accumulation and a little bit of undermining present. Patient History Information obtained from  Patient. Family History Diabetes - Maternal Grandparents,Paternal Grandparents,Father,Siblings, Heart Disease - [place],  Hypertension - Father, No family history of Cancer, Hereditary Spherocytosis, Kidney Disease, Lung Disease, Seizures, Stroke, Thyroid Problems, Tuberculosis. Social History Never smoker, Marital Status - Married, Alcohol Use - Never, Drug Use - No History, Caffeine Use - Moderate - tea. Medical History Eyes Patient has history of Cataracts - left eye removed, mild right eye Denies history of Glaucoma, Optic Neuritis Ear/Nose/Mouth/Throat LYNLEE, STRATTON ([id number]) [id number] Page 12 of 16 Denies history of Chronic sinus problems/congestion, Middle ear problems Hematologic/Lymphatic Patient has history of Anemia Denies history of Hemophilia, Human Immunodeficiency Virus, Lymphedema, Sickle Cell Disease Respiratory Patient has history of Sleep Apnea Denies history of Aspiration, Asthma, Chronic Obstructive Pulmonary Disease (COPD), Pneumothorax, Tuberculosis Cardiovascular Patient has history of Coronary Artery Disease, Hypertension, Myocardial Infarction - 2014 Gastrointestinal Denies history of Cirrhosis , Colitis, Crohnoos, Hepatitis A, Hepatitis B, Hepatitis C Endocrine Patient has history of Type II Diabetes Denies history of Type I Diabetes Immunological Denies history of Lupus Erythematosus, Raynaudoos, Scleroderma Integumentary (Skin) Denies history of History of Burn Musculoskeletal Patient has history of Osteoarthritis, Osteomyelitis - left and right foot Denies history of Gout, Rheumatoid Arthritis Neurologic Patient has history of Neuropathy Denies history of Dementia, Quadriplegia, Paraplegia, Seizure Disorder Oncologic Denies history of Received Chemotherapy, Received Radiation Psychiatric Denies history of Anorexia/bulimia, Confinement Anxiety Hospitalization/Surgery History - left heel surgery. -  hysterectomy. - cholecystectomy. - right elbow surgery x4. - December 2021. - multiple back surgeries. - osteomyelitis in back in hospital x3 weeks. - port insertion. 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 No acute distress.. Vitals Time Taken: 3:23 PM, Temperature: 98.5 F, Pulse: 61 bpm, Respiratory Rate: 18 breaths/min, Blood Pressure: 153/72 mmHg, Capillary Blood Glucose: 121 mg/dl. General Notes: glucose per pt report this am Respiratory Normal work of breathing on room air.. General Notes: 09/15/2022: The wound is about the same size. There is some callus accumulation and a little bit of undermining present. Integumentary (Hair, Skin) Wound #16 status is Open. Original cause of wound was Gradually Appeared. The date acquired was: 10/31/2021. The wound has been in treatment 3 weeks. The wound is located on the Left Calcaneus. The wound measures 1cm length x 1.1cm width x 0.2cm depth; 0.864cm^2 area and 0.173cm^3 volume. There is Fat Layer (Subcutaneous Tissue) exposed. There is no tunneling or undermining noted. There is a medium amount of serosanguineous drainage noted. The wound margin is distinct with the outline attached to the wound base. There is medium (34-66%) red granulation within the wound bed. There is a medium (34-66%) amount of necrotic tissue within the wound bed including Adherent Slough. The periwound skin appearance had no abnormalities noted for color. The periwound skin appearance exhibited: Callus, Dry/Scaly. Periwound temperature was noted as No Abnormality. 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 There was a Selective/Open Wound Skin/Epidermis Debridement o with a total area of 1.1  sq cm performed by Fredirick Maudlin, MD. With the following instrument(s): Curette to remove Non-Viable tissue/material. Material removed includes Callus, Slough, and Skin: Epidermis after achieving pain control using Lidocaine 4% Topical Solution. No specimens were taken. A time out was conducted at 15:40, prior to the start of the procedure. A Minimum amount of bleeding was controlled with Pressure. The procedure was tolerated well with a pain level of 0 throughout and a pain level of 0 following the procedure. Post Debridement Measurements: 1cm length x 1.1cm width x 0.2cm depth; 0.173cm^3 volume. Character of Wound/Ulcer Post Debridement is improved. Post procedure Diagnosis Wound #16: Same as Pre-Procedure General Notes: scribed for Dr. Celine Ahr by Baruch Gouty, RN. Plan Follow-up Appointments: Return Appointment in 2 weeks. - Dr. Celine Ahr - room 2 Anesthetic: (In clinic) Topical Lidocaine 4% applied to wound bed Bathing/ Shower/ Hygiene: May shower with protection but do not get wound dressing(s) wet. Edema Control - Lymphedema / SCD / Other: Elevate legs to the level of the heart or above for 30 minutes daily and/or when sitting, a frequency of: - throughout the day Avoid standing for long periods of time. Moisturize legs daily. The  following medication(s) was prescribed: lidocaine topical 4 % cream cream topical for prior to debridement was prescribed at facility WOUND #16: - Calcaneus Wound Laterality: Left Cleanser: Soap and Water 1 x Per Day/30 Days Discharge Instructions: May shower and wash wound with dial antibacterial soap and water prior to dressing change. Cleanser: Wound Cleanser 1 x Per Day/30 Days Discharge Instructions: Cleanse the wound with wound cleanser prior to applying a clean dressing using gauze sponges, not tissue or cotton balls. Prim Dressing: KerraCel Ag Gelling Fiber Dressing, 2x2 in (silver alginate) 1 x Per Day/30 Days ary Discharge Instructions: Apply silver alginate to wound bed as instructed Secondary Dressing: Optifoam Non-Adhesive Dressing, 4x4 in 1 x Per Day/30 Days Discharge Instructions: Apply over primary dressing as directed. Secondary Dressing: Woven Gauze Sponge, Non-Sterile 4x4 in 1 x Per Day/30 Days Discharge Instructions: Apply over primary dressing as directed. Secured With: [employer], 4.5x3.1 (in/yd) 1 x Per Day/30 Days Discharge Instructions: Secure with Kerlix as directed. Secured With: 87M Medipore H Soft Cloth Surgical T ape, 4 x 10 (in/yd) 1 x Per Day/30 Days Discharge Instructions: Secure with tape as directed. 09/15/2022: The wound is about the same size. There is some callus accumulation and a little bit of undermining present. I used a curette to debride callus and slough from the wound. I then debrided some skin to saucerized the wound and eliminate the undermining. We will continue silver alginate and a foam donut for offloading. She will follow-up in 2 weeks. Electronic Signature(s) Signed: 09/15/2022 4:51:58 PM By: Fredirick Maudlin MD FACS Entered By: Fredirick Maudlin on 09/15/2022 16:51:58 [name] [name] ([id number]) [id number] Page 14 of  16 -------------------------------------------------------------------------------- HxROS Details Patient Name: Date of Service: [name] [name] RE, [name] NNA [name]. 09/15/2022 2:45 PM Medical Record Number: [id number] Patient Account Number: [id number] Date of Birth/Sex: Treating RN: 1956/09/24 (66 y.o. F) Primary Care Provider: [name] Other Clinician: Referring Provider: Treating Provider/Extender: Maxwell Marion in Treatment: 3 Information Obtained From Patient Constitutional Symptoms (General Health) Medical History: Past Medical History Notes: morbid obesity, fibromyalgia Eyes Medical History: Positive for: Cataracts - left eye removed, mild right eye Negative for: Glaucoma; Optic Neuritis Past Medical History Notes: macular degeneration left eye Ear/Nose/Mouth/Throat Medical History: Negative for: Chronic sinus problems/congestion; Middle ear problems Past Medical History Notes: seasonal allergies Hematologic/Lymphatic  Medical History: Positive for: Anemia Negative for: Hemophilia; Human Immunodeficiency Virus; Lymphedema; Sickle Cell Disease Respiratory Medical History: Positive for: Sleep Apnea Negative for: Aspiration; Asthma; Chronic Obstructive Pulmonary Disease (COPD); Pneumothorax; Tuberculosis Cardiovascular Medical History: Positive for: Coronary Artery Disease; Hypertension; Myocardial Infarction - 2014 Past Medical History Notes: hyperlipidemia Gastrointestinal Medical History: Negative for: Cirrhosis ; Colitis; Crohns; Hepatitis A; Hepatitis B; Hepatitis C Past Medical History Notes: fatty liver Endocrine Medical History: Positive for: Type II Diabetes Negative for: Type I Diabetes Time with diabetes: 10 yrs Treated with: Insulin Blood sugar tested every day: Yes Tested : 2-3 times a week Blood sugar testing results: Breakfast: 100; Lunch: 120 Genitourinary Medical History: Past Medical History Notes: CKD stage  3 Immunological Medical History: Negative for: Lupus Erythematosus; Epifania Gore Scleroderma BROOKS, STOTZ [name] ([id number]) [id number] Page 15 of 16 Integumentary (Skin) Medical History: Negative for: History of Burn Musculoskeletal Medical History: Positive for: Osteoarthritis; Osteomyelitis - left and right foot Negative for: Gout; Rheumatoid Arthritis Past Medical History Notes: osteomyelitis in back Neurologic Medical History: Positive for: Neuropathy Negative for: Dementia; Quadriplegia; Paraplegia; Seizure Disorder Past Medical History Notes: cervical ruptured disc Oncologic Medical History: Negative for: Received Chemotherapy; Received Radiation Psychiatric Medical History: Negative for: Anorexia/bulimia; Confinement Anxiety HBO Extended History Items Eyes: Cataracts Immunizations Pneumococcal Vaccine: Received Pneumococcal Vaccination: No Implantable Devices None Hospitalization / Surgery History Type of Hospitalization/Surgery left heel surgery hysterectomy cholecystectomy right elbow surgery x4 December 2021 multiple back surgeries osteomyelitis in back in hospital x3 weeks port insertion Family and Social History Cancer: No; Diabetes: Yes - Maternal Grandparents,Paternal Grandparents,Father,Siblings; Heart Disease: Yes - Mother,Father,Siblings; Hereditary Spherocytosis: No; Hypertension: Yes - Father; Kidney Disease: No; Lung Disease: No; Seizures: No; Stroke: No; Thyroid Problems: No; Tuberculosis: No; Never smoker; Marital Status - Married; Alcohol Use: Never; Drug Use: No History; Caffeine Use: Moderate - tea; Financial Concerns: No; Food, Clothing or Shelter Needs: No; Support System Lacking: No; Transportation Concerns: No Electronic Signature(s) Signed: 09/15/2022 5:13:10 PM By: Fredirick Maudlin MD FACS Entered By: Fredirick Maudlin on 09/15/2022  16:50:40 -------------------------------------------------------------------------------- SuperBill Details Patient Name: Date of Service: [name], [name] NNA [name]. 09/15/2022 CATALEAH, STITES ([id number]) [id number] Page 16 of 16 Medical Record Number: [id number] Patient Account Number: [id number] Date of Birth/Sex: Treating RN: 12-22-1955 (66 y.o. 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, bilateral I10 Essential (primary) hypertension E11.621 Type 2 diabetes mellitus with foot ulcer M46.25 Osteomyelitis of vertebra, thoracolumbar region E11.42 Type 2 diabetes mellitus with diabetic polyneuropathy Facility Procedures : CPT4 Code: [id number] Description: 82707 - DEBRIDE WOUND 1ST 20 SQ CM OR < ICD-10 Diagnosis Description L97.422 Non-pressure chronic ulcer of left heel and midfoot with fat layer exposed Modifier: Quantity: 1 Physician Procedures : CPT4 Code Description Modifier [id number] - WC PHYS LEVEL 3 - EST PT 25 ICD-10 Diagnosis Description L97.422 Non-pressure chronic ulcer of left heel and midfoot with fat layer exposed E11.65 Type 2 diabetes mellitus with hyperglycemia E11.621 Type 2  diabetes mellitus with foot ulcer E11.42 Type 2 diabetes mellitus with diabetic polyneuropathy Quantity: 1 : [id number] - WC PHYS DEBR WO ANESTH 20 SQ CM ICD-10 Diagnosis Description L97.422 Non-pressure chronic ulcer of left heel and midfoot with fat layer exposed Quantity:  1 Electronic Signature(s) Signed: 09/15/2022 4:52:22 PM By: Fredirick Maudlin MD FACS Entered By: Fredirick Maudlin on 09/15/2022 16:52:22

## 2022-09-16 NOTE — Progress Notes (Signed)
[name] [name], [name] [name] ([id number]) [id number] Page 1 of 6 Visit Report for 09/15/2022 Arrival Information Details Patient Name: Date of Service: [name] [name], [place] [name] [name]. 09/15/2022 2:45 PM Medical Record Number: [id number] Patient Account Number: [id number] Date of Birth/Sex: Treating RN: [birth year] (66 y.o. [name] [name] Primary Care [name] [name]: [name] [name] Other Clinician: Referring [name] [name]: Treating [name] [name]/Extender: [name] [name] in Treatment: 3 Visit Information History Since Last Visit Added or deleted any medications: No Patient Arrived: Wheel Chair Any new allergies or adverse reactions: No Arrival Time: 15:20 Had a fall or experienced change in No Accompanied By: spouse activities of daily living that may affect Transfer Assistance: None risk of falls: Patient Identification Verified: Yes Signs or symptoms of abuse/neglect since last visito No Secondary Verification Process Completed: Yes Hospitalized since last visit: No Patient Has Alerts: Yes Implantable device outside of the clinic excluding No Patient Alerts: Patient on Blood Thinner cellular tissue based products placed in the center since last visit: Has Dressing in Place as Prescribed: Yes Pain Present Now: Yes Electronic Signature(s) Signed: 09/15/2022 5:40:17 PM By: Baruch Gouty RN, BSN Entered By: Baruch Gouty on 09/15/2022 15:22:30 -------------------------------------------------------------------------------- Lower Extremity Assessment Details Patient Name: Date of Service: [name] [name], [name] [name]. 09/15/2022 2:45 PM Medical Record Number: [id number] Patient Account Number: [id number] Date of Birth/Sex: Treating RN: [name] (66 y.o. [name] [name] Primary Care [name] [name]: [name] [name] Other Clinician: Referring [name] [name]: Treating [name] [name]/Extender: [name] [name] in Treatment: 3 Edema  Assessment Assessed: [Left: No] [Right: No] Edema: [Left: Ye] [Right: s] Calf Left: Right: Point of Measurement: From Medial Instep 34 cm Ankle Left: Right: Point of Measurement: From Medial Instep 12 cm Vascular Assessment Pulses: Dorsalis Pedis Palpable: [Left:Yes] [Right:[id number] Page 2 of 6] Electronic Signature(s) Signed: 09/15/2022 5:40:17 PM By: Baruch Gouty RN, BSN Entered By: Baruch Gouty on 09/15/2022 15:26:54 -------------------------------------------------------------------------------- Multi Wound Chart Details Patient Name: Date of Service: [name] [name], [name] [name]. 09/15/2022 2:45 PM Medical Record Number: [id number] Patient Account Number: [id number] Date of Birth/Sex: Treating RN: [name] [name], [name] (66 y.o. F) Primary Care [name] [name]: [name] [name] Other Clinician: Referring [name] [name]: Treating [name] [name]/Extender: [name] [name] in Treatment: 3 Vital Signs Height(in): Capillary Blood Glucose(mg/dl): 121 Weight(lbs): Pulse(bpm): 64 Body Mass Index(BMI): Blood Pressure(mmHg): 153/72 Temperature(F): 98.5 Respiratory Rate(breaths/min): 18 [16:Photos:] [N/A:N/A] Left Calcaneus N/A N/A Wound Location: Gradually Appeared N/A N/A Wounding Event: T be determined o N/A N/A Primary Etiology: Cataracts, Anemia, Sleep Apnea, N/A N/A Comorbid History: Coronary Artery Disease, Hypertension, Myocardial Infarction, Type II Diabetes, Osteoarthritis, Osteomyelitis, Neuropathy 10/31/2021 N/A N/A Date Acquired: 3 N/A N/A Weeks of Treatment: Open N/A N/A Wound Status: No N/A N/A Wound Recurrence: 1x1.1x0.2 N/A N/A Measurements L x W x D (cm) 0.864 N/A N/A A (cm) : rea 0.173 N/A N/A Volume (cm) : -22.20% N/A N/A % Reduction in A rea: 18.40% N/A N/A % Reduction in Volume: Full Thickness Without Exposed N/A N/A Classification: Support Structures Medium N/A N/A Exudate A mount: Serosanguineous N/A  N/A Exudate Type: red, brown N/A N/A Exudate Color: Distinct, outline attached N/A N/A Wound Margin: Medium (34-66%) N/A N/A Granulation A mount: Red N/A N/A Granulation Quality: Medium (34-66%) N/A N/A Necrotic A mount: Fat Layer (Subcutaneous Tissue): Yes N/A N/A Exposed Structures: Fascia: No Tendon: No Muscle: No Joint: No Bone: No None N/A N/A Epithelialization: Debridement - Selective/Open Wound N/A N/A Debridement: Pre-procedure Verification/Time Out 15:40 N/A N/A Taken: Lidocaine 4% T opical Solution N/A N/A Pain Control: Callus, Slough N/A  N/A Tissue Debrided: Skin/Epidermis N/A N/A Level: 1.1 N/A N/A Debridement A (sq cm): rea [name] [name], [name] [name] ([id number]) [id number] Page 3 of 6 Curette N/A N/A Instrument: Minimum N/A N/A Bleeding: Pressure N/A N/A Hemostasis A chieved: 0 N/A N/A Procedural Pain: 0 N/A N/A Post Procedural Pain: Procedure was tolerated well N/A N/A Debridement Treatment Response: 1x1.1x0.2 N/A N/A Post Debridement Measurements L x W x D (cm) 0.173 N/A N/A Post Debridement Volume: (cm) Callus: Yes N/A N/A Periwound Skin Texture: Dry/Scaly: Yes N/A N/A Periwound Skin Moisture: No Abnormalities Noted N/A N/A Periwound Skin Color: No Abnormality N/A N/A Temperature: Debridement N/A N/A Procedures Performed: Treatment Notes Electronic Signature(s) Signed: 09/15/2022 4:47:01 PM By: [name] Maudlin MD FACS Entered By: [name] [name] on 09/15/2022 16:47:01 -------------------------------------------------------------------------------- Multi-Disciplinary Care Plan Details Patient Name: Date of Service: [name] [name], [name] [name]. 09/15/2022 2:45 PM Medical Record Number: [id number] Patient Account Number: [id number] Date of Birth/Sex: Treating RN: [birth year] [name] (66 y.o. [name] [name] Primary Care [name] [name]: [name] [name] Other Clinician: Referring [name] [name]: Treating [name] [name]/Extender: [name] [name] in Treatment: 3 Active Inactive Pressure Nursing Diagnoses: Potential for impaired tissue integrity related to pressure, friction, moisture, and shear Goals: Patient will remain free of pressure ulcers Date Initiated: 10/[name]/2023 Target Resolution Date: 10/06/2022 Goal Status: Active Interventions: Assess: immobility, friction, shearing, incontinence upon admission and as needed Assess offloading mechanisms upon admission and as needed Notes: Wound/Skin Impairment Nursing Diagnoses: Knowledge deficit related to ulceration/compromised skin integrity Goals: Patient/caregiver will verbalize understanding of skin care regimen Date Initiated: 10/[name]/2023 Target Resolution Date: 10/06/2022 Goal Status: Active Interventions: Assess patient/caregiver ability to obtain necessary supplies Treatment Activities: Skin care regimen initiated : 10/[name]/2023 Topical wound management initiated : 10/[name]/2023 Notes: [name] [name], [name] [name] ([id number]) [phone].pdf Page 4 of 6 Electronic Signature(s) Signed: 09/15/2022 5:40:17 PM By: Baruch Gouty RN, BSN Entered By: Baruch Gouty on 09/15/2022 15:33:03 -------------------------------------------------------------------------------- Pain Assessment Details Patient Name: Date of Service: [name] [name], [name] [name]. 09/15/2022 2:45 PM Medical Record Number: [id number] Patient Account Number: [id number] Date of Birth/Sex: Treating RN: [birth year] [name] (66 y.o. [name] [name] Primary Care Lezly Rumpf: [name] [name] Other Clinician: Referring Quanisha Drewry: Treating Deral Schellenberg/Extender: [name] [name] in Treatment: 3 Active Problems Location of Pain Severity and Description of Pain Patient Has Paino Yes Site Locations Pain Location: Generalized Pain With Dressing Change: No Duration of the Pain. Constant / Intermittento Constant Rate the pain. Current Pain Level: 8 Character  of Pain Describe the Pain: Aching Pain Management and Medication Current Pain Management: Notes reports chronic back pain Electronic Signature(s) Signed: 09/15/2022 5:40:17 PM By: Baruch Gouty RN, BSN Entered By: Baruch Gouty on 09/15/2022 15:25:00 -------------------------------------------------------------------------------- Patient/Caregiver Education Details Patient Name: Date of Service: [name] [name] [name], [name] [name]. 11/17/2023andnbsp2:45 PM Medical Record Number: [id number] Patient Account Number: [id number] Date of Birth/Gender: Treating RN: [birth year] [name] (66 y.o. [name] [name] Primary Care Physician: [name] [name] Other Clinician: Referring Physician: Treating Physician/Extender: [name] [name] in Treatment: [place], [place] ([id number]) [id number] Page 5 of 6 Education Assessment Education Provided To: Patient Education Topics Provided Elevated Blood Sugar/ Impact on Healing: Methods: Explain/Verbal Responses: Reinforcements needed, State content correctly Offloading: Methods: Explain/Verbal Responses: Reinforcements needed, State content correctly Wound/Skin Impairment: Methods: Explain/Verbal Responses: Reinforcements needed, State content correctly Electronic Signature(s) Signed: 09/15/2022 5:40:17 PM By: Baruch Gouty RN, BSN Entered By: Baruch Gouty on 09/15/2022 15:33:30 -------------------------------------------------------------------------------- Wound Assessment Details Patient Name: Date of Service: [name] [name], [name] [name]. 09/15/2022 2:45 PM Medical Record Number:  [id number] Patient Account Number: [id number] Date of Birth/Sex: Treating RN: [name] (66 y.o. [name] [name] Primary Care Kelia Gibbon: [name] [name] Other Clinician: Referring Aziah Brostrom: Treating Tikesha Mort/Extender: [name] [name] in Treatment: 3 Wound Status Wound Number: [id number] Primary T be  determined o Etiology: Wound Location: Left Calcaneus Wound Open Wounding Event: Gradually Appeared Status: Date Acquired: 10/31/2021 Comorbid Cataracts, Anemia, Sleep Apnea, Coronary Artery Disease, Weeks Of Treatment: 3 History: Hypertension, Myocardial Infarction, Type II Diabetes, Clustered Wound: No Osteoarthritis, Osteomyelitis, Neuropathy Photos Wound Measurements Length: (cm) 1 Width: (cm) 1.1 Depth: (cm) 0.2 Area: (cm) 0.864 Volume: (cm) 0.173 % Reduction in Area: -22.2% % Reduction in Volume: 18.4% Epithelialization: None Tunneling: No Undermining: No Wound Description Classification: Full Thickness Without Exposed Support TEVIS, CONGER ([id number]) Wound Margin: Distinct, outline attached Exudate Amount: Medium Exudate Type: Serosanguineous Exudate Color: red, brown Structures Foul Odor After Cleansing: No [phone].pdf Page 6 of 6 Slough/Fibrino No Wound Bed Granulation Amount: Medium (34-66%) Exposed Structure Granulation Quality: Red Fascia Exposed: No Necrotic Amount: Medium (34-66%) Fat Layer (Subcutaneous Tissue) Exposed: Yes Necrotic Quality: Adherent Slough Tendon Exposed: No Muscle Exposed: No Joint Exposed: No Bone Exposed: No Periwound Skin Texture Texture Color No Abnormalities Noted: No No Abnormalities Noted: Yes Callus: Yes Temperature / Pain Temperature: No Abnormality Moisture No Abnormalities Noted: No Dry / Scaly: Yes Electronic Signature(s) Signed: 09/15/2022 5:40:17 PM By: Baruch Gouty RN, BSN Entered By: Baruch Gouty on 09/15/2022 15:31:18 -------------------------------------------------------------------------------- Vitals Details Patient Name: Date of Service: [name] [name], [name] [name]. 09/15/2022 2:45 PM Medical Record Number: [id number] Patient Account Number: [id number] Date of Birth/Sex: Treating RN: [birth year] [name] (66 y.o. [name] [name] Primary Care Stephanny Tsutsui: [name] [name] Other  Clinician: Referring Yahir Tavano: Treating Zenya Hickam/Extender: [name] [name] in Treatment: 3 Vital Signs Time Taken: 15:23 Temperature (F): 98.5 Pulse (bpm): 61 Respiratory Rate (breaths/min): 18 Blood Pressure (mmHg): 153/72 Capillary Blood Glucose (mg/dl): 121 Reference Range: 80 - 120 mg / dl Notes glucose per pt report this am Electronic Signature(s) Signed: 09/15/2022 5:40:17 PM By: Baruch Gouty RN, BSN Entered By: Baruch Gouty on 09/15/2022 15:24:31

## 2022-09-19 ENCOUNTER — Ambulatory Visit (HOSPITAL_COMMUNITY)
Admission: RE | Admit: 2022-09-19 | Discharge: 2022-09-19 | Disposition: A | Discharge status: Home / Self Care | Payer: PRIVATE HEALTH INSURANCE | Source: Ambulatory Visit | Attending: Internal Medicine | Admitting: Internal Medicine

## 2022-09-19 DIAGNOSIS — Z452 Encounter for adjustment and management of vascular access device: Secondary | ICD-10-CM | POA: Insufficient documentation

## 2022-09-19 DIAGNOSIS — M4625 Osteomyelitis of vertebra, thoracolumbar region: Secondary | ICD-10-CM | POA: Diagnosis not present

## 2022-09-19 HISTORY — PX: IR REMOVAL TUN CV CATH W/O FL: IMG2289

## 2022-09-19 MED ORDER — LIDOCAINE HCL 1 % IJ SOLN
INTRAMUSCULAR | Status: AC
  Filled 2022-09-19: qty 20

## 2022-09-19 MED ORDER — CHLORHEXIDINE GLUCONATE 4 % EX LIQD
CUTANEOUS | Status: AC
  Filled 2022-09-19: qty 15

## 2022-09-19 NOTE — Procedures (Signed)
Interventional Radiology Procedure Note  Risks and benefits of removal of LIJ tunneled central catheter were discussed with the patient including, but not limited to bleeding, hematoma, infection, damage to adjacent structures.  All of the patient's questions were answered, patient is agreeable to proceed. Consent signed and in chart. PROCEDURE SUMMARY:  Successful removal of tunneled dialysis catheter.  No complications.   EBL = trace  Please see full dictation in imaging section of Epic for procedure details.  Jacqualine Mau NP 09/19/2022 12:58 PM

## 2022-09-25 NOTE — Telephone Encounter (Signed)
Mailed to pt

## 2022-09-26 ENCOUNTER — Encounter: Admit: 2022-09-26 | Discharge: 2022-09-26 | Payer: MEDICARE

## 2022-09-26 MED ORDER — MIRTAZAPINE 15 MG PO TAB
15 mg | ORAL_TABLET | Freq: Every evening | ORAL | 2 refills | Status: AC
Start: 2022-09-26 — End: ?

## 2022-09-29 ENCOUNTER — Ambulatory Visit (HOSPITAL_BASED_OUTPATIENT_CLINIC_OR_DEPARTMENT_OTHER): Payer: PRIVATE HEALTH INSURANCE | Admitting: General Surgery

## 2022-10-02 ENCOUNTER — Encounter: Admit: 2022-10-02 | Discharge: 2022-10-02 | Payer: MEDICARE

## 2022-10-02 DIAGNOSIS — I10 Essential (primary) hypertension: Secondary | ICD-10-CM

## 2022-10-05 ENCOUNTER — Other Ambulatory Visit: Payer: Self-pay | Admitting: Family Medicine

## 2022-10-05 DIAGNOSIS — Z981 Arthrodesis status: Secondary | ICD-10-CM

## 2022-10-05 DIAGNOSIS — G894 Chronic pain syndrome: Secondary | ICD-10-CM

## 2022-10-05 DIAGNOSIS — M8610 Other acute osteomyelitis, unspecified site: Secondary | ICD-10-CM

## 2022-10-05 MED ORDER — HYDROCODONE-ACETAMINOPHEN 5-325 MG PO TABS: 1.0000 | ORAL_TABLET | Freq: Four times a day (QID) | ORAL | 0 refills | Status: DC | PRN

## 2022-10-05 NOTE — Telephone Encounter (Signed)
Medication: HYDROcodone-acetaminophen (NORCO/VICODIN) 5-325 MG tablet  Has the patient contacted their pharmacy? No.   Preferred Pharmacy:   [facility] [place] [address], [place] [address] Phone: [phone]  Fax: [phone]

## 2022-10-05 NOTE — Telephone Encounter (Signed)
Requesting: hydrocodone 5-311m  Contract:02/14/22 UDS: 02/14/22 Last Visit: 09/07/22 Next Visit: None Last Refill: 09/07/22 #120 and 0RF   Please Advise

## 2022-10-09 ENCOUNTER — Encounter (HOSPITAL_BASED_OUTPATIENT_CLINIC_OR_DEPARTMENT_OTHER): Payer: PRIVATE HEALTH INSURANCE | Attending: General Surgery | Admitting: Internal Medicine

## 2022-10-09 ENCOUNTER — Encounter: Admit: 2022-10-09 | Discharge: 2022-10-09 | Payer: MEDICARE

## 2022-10-09 DIAGNOSIS — E1122 Type 2 diabetes mellitus with diabetic chronic kidney disease: Secondary | ICD-10-CM | POA: Diagnosis not present

## 2022-10-09 DIAGNOSIS — E11319 Type 2 diabetes mellitus with unspecified diabetic retinopathy without macular edema: Secondary | ICD-10-CM | POA: Insufficient documentation

## 2022-10-09 DIAGNOSIS — I130 Hypertensive heart and chronic kidney disease with heart failure and stage 1 through stage 4 chronic kidney disease, or unspecified chronic kidney disease: Secondary | ICD-10-CM | POA: Insufficient documentation

## 2022-10-09 DIAGNOSIS — E1142 Type 2 diabetes mellitus with diabetic polyneuropathy: Secondary | ICD-10-CM | POA: Insufficient documentation

## 2022-10-09 DIAGNOSIS — E11621 Type 2 diabetes mellitus with foot ulcer: Secondary | ICD-10-CM | POA: Insufficient documentation

## 2022-10-09 DIAGNOSIS — L97422 Non-pressure chronic ulcer of left heel and midfoot with fat layer exposed: Secondary | ICD-10-CM | POA: Insufficient documentation

## 2022-10-09 DIAGNOSIS — M1990 Unspecified osteoarthritis, unspecified site: Secondary | ICD-10-CM | POA: Insufficient documentation

## 2022-10-09 DIAGNOSIS — L03116 Cellulitis of left lower limb: Secondary | ICD-10-CM | POA: Diagnosis not present

## 2022-10-09 DIAGNOSIS — L03115 Cellulitis of right lower limb: Secondary | ICD-10-CM | POA: Insufficient documentation

## 2022-10-09 DIAGNOSIS — N184 Chronic kidney disease, stage 4 (severe): Secondary | ICD-10-CM | POA: Insufficient documentation

## 2022-10-09 DIAGNOSIS — I2510 Atherosclerotic heart disease of native coronary artery without angina pectoris: Secondary | ICD-10-CM | POA: Diagnosis not present

## 2022-10-09 DIAGNOSIS — I509 Heart failure, unspecified: Secondary | ICD-10-CM | POA: Insufficient documentation

## 2022-10-09 DIAGNOSIS — I82503 Chronic embolism and thrombosis of unspecified deep veins of lower extremity, bilateral: Secondary | ICD-10-CM | POA: Diagnosis not present

## 2022-10-09 MED ORDER — PANTOPRAZOLE 40 MG PO TBEC
40 mg | ORAL_TABLET | Freq: Every day | ORAL | 3 refills | 90.00000 days | Status: AC
Start: 2022-10-09 — End: ?

## 2022-10-09 MED ORDER — IRBESARTAN 300 MG PO TAB
300 mg | ORAL_TABLET | Freq: Every evening | ORAL | 3 refills | Status: AC
Start: 2022-10-09 — End: ?

## 2022-10-09 MED ORDER — GLIMEPIRIDE 4 MG PO TAB
4 mg | ORAL_TABLET | Freq: Every morning | ORAL | 3 refills | Status: AC
Start: 2022-10-09 — End: ?

## 2022-10-09 MED ORDER — PIOGLITAZONE 45 MG PO TAB
45 mg | ORAL_TABLET | Freq: Every day | ORAL | 3 refills | 90.00000 days | Status: AC
Start: 2022-10-09 — End: ?

## 2022-10-10 ENCOUNTER — Ambulatory Visit (INDEPENDENT_AMBULATORY_CARE_PROVIDER_SITE_OTHER): Payer: PRIVATE HEALTH INSURANCE | Admitting: *Deleted

## 2022-10-10 ENCOUNTER — Telehealth: Payer: Self-pay | Admitting: *Deleted

## 2022-10-10 ENCOUNTER — Inpatient Hospital Stay: Payer: PRIVATE HEALTH INSURANCE | Attending: Hematology & Oncology

## 2022-10-10 ENCOUNTER — Inpatient Hospital Stay: Payer: PRIVATE HEALTH INSURANCE

## 2022-10-10 ENCOUNTER — Other Ambulatory Visit: Payer: Self-pay | Admitting: *Deleted

## 2022-10-10 VITALS — BP 104/57 | HR 57 | Temp 98.2°F | Resp 18

## 2022-10-10 DIAGNOSIS — D631 Anemia in chronic kidney disease: Secondary | ICD-10-CM | POA: Diagnosis present

## 2022-10-10 DIAGNOSIS — N183 Chronic kidney disease, stage 3 unspecified: Secondary | ICD-10-CM | POA: Insufficient documentation

## 2022-10-10 DIAGNOSIS — M810 Age-related osteoporosis without current pathological fracture: Secondary | ICD-10-CM

## 2022-10-10 DIAGNOSIS — D500 Iron deficiency anemia secondary to blood loss (chronic): Secondary | ICD-10-CM

## 2022-10-10 DIAGNOSIS — D508 Other iron deficiency anemias: Secondary | ICD-10-CM

## 2022-10-10 DIAGNOSIS — N1832 Chronic kidney disease, stage 3b: Secondary | ICD-10-CM

## 2022-10-10 LAB — CMP (CANCER CENTER ONLY)
ALT: 8 U/L (ref 0–44)
AST: 13 U/L — ABNORMAL LOW (ref 15–41)
Albumin: 3.6 g/dL (ref 3.5–5.0)
Alkaline Phosphatase: 78 U/L (ref 38–126)
Anion gap: 9 (ref 5–15)
BUN: 77 mg/dL — ABNORMAL HIGH (ref 8–23)
CO2: 17 mmol/L — ABNORMAL LOW (ref 22–32)
Calcium: 8.6 mg/dL — ABNORMAL LOW (ref 8.9–10.3)
Chloride: 110 mmol/L (ref 98–111)
Creatinine: 4.44 mg/dL (ref 0.44–1.00)
GFR, Estimated: 10 mL/min — ABNORMAL LOW (ref >60)
Glucose, Bld: 132 mg/dL — ABNORMAL HIGH (ref 70–99)
Potassium: 5.1 mmol/L (ref 3.5–5.1)
Sodium: 136 mmol/L (ref 135–145)
Total Bilirubin: 0.3 mg/dL (ref 0.3–1.2)
Total Protein: 7.8 g/dL (ref 6.5–8.1)

## 2022-10-10 LAB — CBC WITH DIFFERENTIAL (CANCER CENTER ONLY)
Abs Immature Granulocytes: 0.02 10*3/uL (ref 0.00–0.07)
Basophils Absolute: 0 10*3/uL (ref 0.0–0.1)
Basophils Relative: 1 %
Eosinophils Absolute: 0.2 10*3/uL (ref 0.0–0.5)
Eosinophils Relative: 3 %
HCT: 30.2 % — ABNORMAL LOW (ref 36.0–46.0)
Hemoglobin: 9.3 g/dL — ABNORMAL LOW (ref 12.0–15.0)
Immature Granulocytes: 0 %
Lymphocytes Relative: 15 %
Lymphs Abs: 0.7 10*3/uL (ref 0.7–4.0)
MCH: 28.2 pg (ref 26.0–34.0)
MCHC: 30.8 g/dL (ref 30.0–36.0)
MCV: 91.5 fL (ref 80.0–100.0)
Monocytes Absolute: 0.3 10*3/uL (ref 0.1–1.0)
Monocytes Relative: 5 %
Neutro Abs: 3.8 10*3/uL (ref 1.7–7.7)
Neutrophils Relative %: 76 %
Platelet Count: 126 10*3/uL — ABNORMAL LOW (ref 150–400)
RBC: 3.3 MIL/uL — ABNORMAL LOW (ref 3.87–5.11)
RDW: 16.5 % — ABNORMAL HIGH (ref 11.5–15.5)
WBC Count: 5 10*3/uL (ref 4.0–10.5)
nRBC: 0 % (ref 0.0–0.2)

## 2022-10-10 MED ORDER — EPOETIN ALFA-EPBX 40000 UNIT/ML IJ SOLN
40000.0000 [IU] | Freq: Once | INTRAMUSCULAR | Status: AC
  Administered 2022-10-10: 40000 [IU] via SUBCUTANEOUS
  Filled 2022-10-10: qty 1

## 2022-10-10 MED ORDER — ROMOSOZUMAB-AQQG 105 MG/1.17ML ~~LOC~~ SOSY
210.0000 mg | PREFILLED_SYRINGE | Freq: Once | SUBCUTANEOUS | Status: AC
  Administered 2022-10-10: 210 mg via SUBCUTANEOUS

## 2022-10-10 NOTE — Progress Notes (Signed)
[name], [name] ([id number]) [id number] Page 1 of 13 Visit Report for 10/09/2022 Debridement Details Patient Name: Date of Service: [name] [name] [name], [place] NNA [name]. 10/09/2022 3:30 PM Medical Record Number: [id number] Patient Account Number: [id number] Date of Birth/Sex: Treating RN: October 08, 1956 (66 y.o. F) Primary Care Provider: [name] Other Clinician: Referring Provider: Treating Provider/Extender: [name] in Treatment: 6 Debridement Performed for Assessment: Wound #16 Left Calcaneus Performed By: Physician Ricard Dillon., MD Debridement Type: Debridement Severity of Tissue Pre Debridement: Fat layer exposed Level of Consciousness (Pre-procedure): Awake and Alert Pre-procedure Verification/Time Out Yes - 15:56 Taken: Start Time: 15:56 Pain Control: Lidocaine 4% Topical Solution T Area Debrided (L x W): otal 1 (cm) x 1 (cm) = 1 (cm) Tissue and other material debrided: Non-Viable, Eschar, Skin: Epidermis Level: Skin/Epidermis Debridement Description: Selective/Open Wound Instrument: Curette Bleeding: Minimum Hemostasis Achieved: Pressure Response to Treatment: Procedure was tolerated well Level of Consciousness (Post- Awake and Alert procedure): Post Debridement Measurements of Total Wound Length: (cm) 1 Width: (cm) 1 Depth: (cm) 0.2 Volume: (cm) 0.157 Character of Wound/Ulcer Post Debridement: Improved Severity of Tissue Post Debridement: Fat layer exposed Post Procedure Diagnosis Same as Pre-procedure Notes scribed for Dr. Dellia Nims by Adline Peals, RN Electronic Signature(s) Signed: 10/09/2022 5:01:06 PM By: Linton Ham MD Entered By: Linton Ham on 10/09/2022 16:32:30 -------------------------------------------------------------------------------- HPI Details Patient Name: Date of Service: [name] [name], [name] NNA [name]. 10/09/2022 3:30 PM Medical Record Number: [id number] Patient Account Number:  [id number] Date of Birth/Sex: Treating RN: [birth year] (66 y.o. F) Primary Care Provider: [name] Other Clinician: Referring Provider: Treating Provider/Extender: [name] in Treatment: 6 History of Present Illness [name] [name], [name] [name] ([id number]) [id number] Page 2 of 13 HPI Description: ADMISSION 11/12/2018 This is a 66 year old woman with type 2 diabetes and diabetic neuropathy. She has been dealing with a left heel plantar wound for roughly 2 years. She states this started when she pulled some skin off the area and it progressed into a wound. She also has an area on the tip of her left great toe for 1 year. She has been largely followed by Dr. Sharol Given and she had an excision of bone in the left heel in 2017. I do not see microbiology from this excision or pathology. Apparently this wound never really closed. She most recently has been using Silvadene cream. Offloading this with a scooter. The last MRI I see was in June 2018 which did not show osteomyelitis at that time. Apparently this wound is never really progressed towards healing. During her last review by Dr. Sharol Given in October it was recommended that she undergo a left BKA and she refused. She went to see a second orthopedic consult at [facility] who am recommended conservative wound care to see if this will close or progress towards closure but also warned that possible surgery may be necessary. An x-ray that was done at [facility] showed an irregular calcaneal body and calcaneal tuberosity. This is probably postprocedural. Previous x-rays at [facility] had suggested heterotrophic calcifications but I do not see this. Apparently a second ointment was added to the Silvadene which the patient thinks is because some improvement. She has not been systemically unwell. No fever or chills. She thinks the second ointment that was given to her at [facility] has helped somewhat. 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 ABIs done in June 2018 showed a ABI of 1.12 on the right and 1.1 on the left she was biphasic bilaterally. ABIs in our clinic were noncompressible today. 11/20/18 on evaluation today patient is seen for her second visit here in the office although this is actually the first visit with me concerning an issue that she's having with her great toe and heel of the left foot. Fortunately she does not appear to be have any discomfort at this time and she does have a scooter in order to offload her foot she also has a boot for offloading. With that being said this is something that appears to have been going on for some time she was seeing Dr. due to his recommendation was that she was going to require a below knee amputation. With that being said she wanted to come to the wound center but according to the patient Dr. Sharol Given told her that "we could not help her". Nonetheless she saw another provider who suggested that it may be worth a shot for [name] to try and help her out if it all possible. Nonetheless she decided that it would be worth a trial since otherwise any the way she's gonna end up with an amputation. Obviously if we can heal the wound then that will not be the case. Again I explained to her that obviously there are no guarantees but we will give this a good try and attempts to get the wound to heal. 1/30; the patient continues to have areas on the left plantar heel and the left plantar great toe. The more worrisome area is the heel. She went for MRI on Saturday but this could not be done because she had silver alginate in the wound bed. I would like to get this rebooked. If she  does not have osteomyelitis she will need a total contact cast. We have been using silver alginate in the wounds 2/7; patient has wounds on her left plantar heel and left plantar great toe. The area on the heel has some depth although it looks about the same today. Her MRI will finally be done tomorrow. If she has osteomyelitis in the heel and then we will need to consider her for IV antibiotics and hyperbaric oxygen. If the MRI is negative she will need a total contact cast although she is wearing a cam boot and using a scooter at present. Will be using silver alginate. She will take it off tomorrow in preparation for the MRI 2/14; the patient's MRI showed extensive surgical changes with a large portion of her calcaneus removed on the left secondary to her previous surgery by Dr. Sharol Given however there is no evidence of osteomyelitis. She would therefore be a candidate for a total contact cast and we applied this for the first time today 2/21; silver collagen total contact cast. The area on the plantar left great toe is "healed" still a lot of callus on this area. Dimensions on the plantar heel not too much different some epithelialization is present however. This is an improvement 12/25/18 on evaluation today patient actually is seen for follow-up concerning issues that she has been having with her cast she states she feels like it's wet and squishy in the bottom of her cast. The reason she does come in to have this evaluated and see what is  going on. Fortunately there's no signs of infection at this time was the cast was removed. 3/6; the patient's area on the tip of her right great toe is callused but there is no open area here. She still has the fairly extensive area in the left heel. We have been using silver collagen in the wound. 01/08/19 on evaluation today patient actually appears to be doing very well in regard to her heel ulcer in my pinion to see if you shown signs of improvement which is  excellent news. There's no evidence of infection. She continues to have quite a bit of drainage but fortunately again this doesn't seem to be hindering her healing. 3/20 -Patient's foot ulcer on the left appears to be doing well, the dimensions appear encouraging, we have been using total contact cast with Prisma and will continue doing that 3/27; patient continues to make nice progress on the left plantar heel. She is using silver collagen under a total contact cast. It is been a while since I have seen this wound and it really looks a lot better. Smaller with healthy granulation 4/3; she continues to make nice progress on the left plantar heel. Using silver collagen under a total contact cast 4/10; left plantar heel. Again skin over the surface of the wound with not much in the way of adherence. This results in undermining. Using silver collagen changed to silver alginate 4/17 left plantar heel. Again not much improvement. There is no undermining today no debridement was required we used silver alginate last time because of excessive moisture 4/24 left plantar heel. Again not as much improvement as I would have liked. About 3 mm of depth. Thick callused skin around the circumference. Using silver alginate 5/1; left plantar heel this is improved this week. Less depth epithelialization is present. Using silver alginate alginate under a total contact cast 5/8; left plantar heel dimensions are about the same. The depth appears to be improved. I use silver collagen starting today under the cast 5/15 left plantar heel. Arrives today with a 2-day history of feeling like something had "slipped" while walking her dog in the cast. Unfortunately extensive area relatively to the small wound of undermining superiorly denuded epithelium. 5/22-Patient returns at 1 week after being taken off the TCC on account of some fluid collection that was debrided and cultured at last visit from the plantar ulcer on the  left heel. The culture results are polymicrobial with Klebsiella, enterococcus, Proteus growth these organisms are sensitive to Cipro except with enterococcus which is sensitive to ampicillin but patient is highly allergic to penicillin according to her. 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 5/29; the patient arrived last week with a new wound on her left plantar great toe. With regards to the culture that I did 2 weeks ago of her deteriorating heel wound this grew Klebsiella and enterococcus. She was given Cipro however the enterococcus would not be covered well by a quinolone. She is allergic to penicillin. I will give her linezolid 600 twice daily x5 days 6/12; the patient continues to have a wound on her left and after she returns to the beach there is undermining laterally. She has the new wound from this 2 weeks or so ago on the plantar tip of her left great toe. She had a fall today scraping the dorsal surface of the left fifth 7/14; READMISSION since the patient was last  here she was hospitalized from 04/15/2019 through 04/22/2019. She had presented to an outside ER after falling and hitting her elbow. She had had previous surgery on the elbow and fractures several years ago. An x-ray was negative and she was sent home. She is readmitted with sepsis and acute renal failure secondary to septic arthritis of the elbow. Cultures apparently showed pansensitive staph aureus however she has a severe beta-lactam allergy. She was treated with vancomycin. Apparently the vancomycin is completed and her PICC line is removed although she is seeing Dr. Megan Salon tomorrow due to continued pain and swelling. In the hospital she had an IandD by Dr. [name] of orthopedics. The original surgery was on 04/17/2019 and it was felt that she had septic arthritis. She is still having a lot of pain and swelling in the right elbow and  apparently is due to see Dr. Megan Salon tomorrow and what she thinks is that she will be restarted on antibiotics. With regards to her heel wounds/left foot wounds. Her arterial studies were checked and her ABIs were within normal limits. X-ray showed plantar foot ulcer negative for osteomyelitis postop resection of the posterior calcaneus. She has been using silver alginate to the heel 8/4-Patient returns after being seen on 7/14, we are using silver alginate to the calcaneal wound, she is continuing to receive care for her right elbow septic arthritis 8/14- Patient returns after 1 week, she is being seen by the surgical group for her right elbow, she is here for the left calcaneal wound for which we are using silver alginate this is about the same 8/21; this is a patient I readmitted to the clinic 5 weeks ago. She is continuing to have difficulties with a septic arthritis of the right elbow she had after a fall [name] [name], [name] ([id number]) [id number] Page 3 of 13 and apparently has had surgical IandD's since the last time we saw her. She is also following with Dr. Megan Salon of infectious disease and is apparently on 3 oral antibiotics although at the time of this dictation I am not sure what they are. She comes in today with a necrotic surface on the left great toe with a blister laterally. This was still clearly an open wound. She had purulent drainage coming out of this. The original wound on the plantar aspect of her right heel in the setting of a Charcot foot is deep not open to bone but certainly not any better at all. We have been using silver alginate 8/28; dealing with septic arthritis of the right elbow. May need to go for further surgery here in the second week of September. She had a blister on the left great toe that was purulent [name] [name] draining last week although a culture did not grow anything [already on antibiotics through infectious disease for her elbow].  The punched-out area on her heel is just like it was when she first came in. This almost closed with a total contact cast there are no options for that now. The patient states she cannot stay off her foot having to do housework X-ray of the foot showed a moderate bunion and severe degenerative changes at the first metatarsal phalangeal joint there was a moderate plantar calcaneal spur. We will need to check the location of this. 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 It is oral.  We are using silver collagen to the punched-out area on her left heel and to the summer superficial area of the left great toe. She is offloading this is best she can although judging by the amount of callus it is not enough. She thinks she has enough strength in her right elbow now to use her scooter. 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 Hale Bogus notes and infectious disease. We are using silver collagen to the punched-out areas on her left heel and to the plantar aspect of her left great toe she is now using her scooter to get around and to help offload these areas 10/2; 2-week follow-up. Still on clindamycin I believe for the left elbow she is going for an MRI of the elbow over the weekend. She is then going to see orthopedics and infectious disease. The area on the left heel is certainly no better. This does not probe to bone however there is green drainage. She has an area on the tip of her toe. The area on the heel is in a wound we almost closed at one point with total contact casting. 10/9; one-week follow-up. Culture I did of the heel last week which was a swab culture admittedly showed  moderate Enterococcus faecalis moderate Pseudomonas. The wound itself looks about the same. There is no palpable bone although the depth of it closely approximates bone. The heel is swollen but not erythematous. Her MRI is booked for next week. She also has an MRI of the right elbow. I've also lifted the last consult from Dr. Megan Salon who is following her for septic arthritis of the right elbow. I did not see him specifically comment on her left heel however the patient states that he is aware of this. I did not specifically address the organisms I cultured last week because of the possible effect of any additional cultures that will be done on the elbow. Additionally these were superficial not bone cultures 10/16; her MRI of the heel was put back to next week. She did have her elbow done. I have been in contact with Dr. Megan Salon about my concerns about osteomyelitis of the left heel. We have been using collagen. She also has a wound on the plantar tip of her first toe 10/23; her MRI of the heel was negative for osteomyelitis but suggested a small abscess. She also had an MRI of her elbow that showed findings compatible with a septic joint. She went to see Dr. Megan Salon and she is going to have both oral and IV antibiotics which I am sure will cover any infection in the heel. I did not actually see his note today. We are using silver collagen offloading the heel with a heel offloading boot 11/6; patient continues with silver collagen to both wound areas on her plantar left heel and plantar left great toe. She remains on daptomycin by Dr. Megan Salon of infectious disease. She complains of diarrhea. Dr. Megan Salon is aware of this. 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 she has been put on oral vancomycin I think a lot of high clinical suspicion for pseudomembranous  colitis Because of the right elbow there are no options  to completely off her left foot beyond the heel offloading boot we have now. Fortunately the left heel ulcer itself has remained static some improvement in the plantar left great toe 11/20; patient arrives for review of her left heel ulcer. I also note she has a difficult time with septic arthritis of the right elbow. She currently is on IV daptomycin which seems to have helped the drainage in her elbow [MSSA]. Because of high concern for pseudomembranous colitis she was also put on vancomycin orally and Flagyl orally. She was on Levaquin but that was discontinued because of the concern about C. difficile. She states her diarrhea is better. She arrives in clinic today with a large area of denuded skin on the lateral part of the heel. This had totally separated. We have been using silver alginate to the wound areas. She arrived in a scooter telling me she is offloading the foot is much as possible. 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 Dr. Megan Salon stopped the daptomycin and now has her on vancomycin 1000 mg IV daily. This is supposed to go onto 1/18. She is being referred to [place] for what sounds like a elbow replacement type operation for her MSSA septic arthritis. She arrives in clinic today with a blister on the medial part of the wound on her heel. She still has a lot of callus around the heel although the surface of the wound on the left heel looks somewhat better. READMISSION 03/19/2020 Mrs. Biss is a 66 year old woman we have followed for almost all of 2020 with a neuropathic wound on her left heel and the tip of her left great toe.. We she had previously had a partial calcanectomy by Dr. Sharol Given because of underlying osteomyelitis. She  also had a history of MRSA even when we admitted her to the clinic. An MRI when she first came into our clinic did not show osteomyelitis. We put her in a total contact cast and gradually this contracted to the point it was almost closed however in that same timeframe she had a fall. 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 We had to stop putting her in a cast partially because of infection and partially because she could not support herself with the elbow. Things went progressively downhill. When we last saw her at the end of December she had a large open wound on the left heel. She had had recurrent infections in the right elbow. It was felt that either the heel lift: Ice her elbow or vice versa. We never had MRSA I do not believe cultured in the left heel however. 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 She underwent an operative debridement and a T11-L3 fusion. An operative culture grew Pseudomonas. She was treated with cefepime and required readmission to the hospital with acute kidney failure requiring temporary dialysis. She was discharged to rehab I believe she signed herself out [place]. He has been following with her primary doctor and she comes in the clinic today with miraculously the left heel totally closed. She is followed up with Dr. Megan Salon of infectious disease he does not feel she has any evidence of infection in her elbow or her heel for that matter and the heel is actually  fully epithelialized. She is on ciprofloxacin 500 twice daily I think mostly directed at Pseudomonas. The patient is convinced that it was the treatment of the Pseudomonas in her back that ultimately led to closure of her heel and that certainly possible or could have been because she was nonambulatory in the hospital for 3 months according to her. She is still using her heel offloading boot and she says she is  "learning to walk again" Readmission: 06/09/2020 patient presents today for reevaluation here in the clinic following a reopening of her heel ulcer. She tells [name] that this actually occurred about 2 weeks after she was last seen in May of this year though it is now the beginning of August and she has not come back until now to see [name]. Nonetheless she does have an open wound there is been no x-rays at this time infectious disease has been monitoring him following this up until this point. They made the recommendation that she come to see [name]. Fortunately there is no signs of active infection systemically though she does tell me that she had Pseudomonas in the spine when she had her back surgery she is currently on antibiotics for that. 8/20; this is a patient who I saw 1 time in late May. Apparently shortly after she was here the area on her plantar left heel started to drain again and she had an open wound. She has been going to her primary physician and has been following with Dr. Megan Salon. 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 She is currently on 3 times a day prophylactic ciprofloxacin. She is admitted to the clinic last week for review of the wound on the left heel. Notable for the fact that she had a partial calcanectomy by Dr. Sharol Given 3 or 4 years ago. She says she is using her motorized wheelchair again as an off loader but she came in in regular shoes. 9/2; 2-week follow-up. Plantar left heel. In spite of debridement last week she had thick very adherent callus around the circumference of the wound and a SAAMIYA, JEPPSEN [name] ([id number]) [id number] Page 4 of 13 nonviable gray surface. We have been using Hydrofera Blue. We gave her a heel offloading boot last week. She says she is off her foot except for she needs to walk her dog. 9/17; 2 week follow-up. Lives with a heel ulcer  in roughly the same condition as a month ago. Though requiring debridement and revision using Hydrofera Blue. She says she is often except for times that she needs to walk her dog and she claims to be using the heel offloading shoe. She has a new injury which she says was a burn on the medial part of her fifth metacarpophalangeal on the right. 10/15; almost 1 month follow-up. She arrives with the plantar left heel ulcer and not a very good condition. She has thick callus surrounding the wound and a necrotic base over the top of this. I am not sure anybody is paying close attention to this. She says she walks minimally but I highly doubt this.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 She is now nonambulatory. The wound seems to improved in terms of dimensions Objective Constitutional Vitals Time Taken: 3:38 PM, Temperature: 97.8 F, Pulse: 55 bpm, Respiratory Rate: 18 breaths/min, Blood Pressure: 182/64 mmHg, Capillary Blood Glucose: 109 mg/dl. Integumentary (Hair, Skin) Wound #16 status is Open. Original cause of wound was Gradually Appeared. The date acquired was: 10/31/2021. The wound has been in treatment 6 weeks. The wound is located on the Left Calcaneus. The wound measures 1cm length x 1cm width x 0.2cm depth; 0.785cm^2 area and 0.157cm^3 volume. There is Fat Layer (Subcutaneous Tissue) exposed. There is no tunneling or undermining noted. There is a medium amount of serosanguineous drainage noted. The wound margin is distinct with the outline attached to the wound base. There is large (67-100%) red granulation within the wound bed. There is a small (1-33%) amount of necrotic tissue within the wound bed including Adherent Slough. The periwound skin appearance had no abnormalities noted for color. The periwound skin appearance exhibited: Callus, Dry/Scaly. Periwound temperature was noted as No Abnormality. 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 There was a Selective/Open Wound Skin/Epidermis Debridement with a total area of 1 sq cm performed by Ricard Dillon., MD. With the following instrument(s): Curette to remove Non-Viable tissue/material. Material removed includes Eschar and Skin: Epidermis and after achieving pain control using Lidocaine 4% Topical Solution. No specimens were taken. A time out was conducted at 15:56, prior to the start of the procedure. A Minimum amount of bleeding was controlled with Pressure. The procedure was tolerated well. Post Debridement Measurements: 1cm length x 1cm width x 0.2cm depth; 0.157cm^3 volume. Character of Wound/Ulcer Post Debridement is improved. Severity of Tissue Post Debridement is: Fat layer exposed. Post procedure Diagnosis Wound #16: Same as Pre-Procedure General Notes: scribed for Dr. Dellia Nims by Adline Peals, RN. Plan Follow-up Appointments: Return Appointment in 2 weeks. - Dr. Celine Ahr - room 2 Anesthetic: (In clinic) Topical Lidocaine 4% applied to wound bed Bathing/ Shower/ Hygiene: May shower with protection but do not get wound dressing(s) wet. Edema Control - Lymphedema / SCD / OtherDORIE, [name] [name] ([id number]) [id number] Page 12 of 13 Elevate legs to the level of the heart or above for 30 minutes daily and/or when sitting, a frequency of: - throughout the day Avoid standing for long periods of time. Moisturize legs daily. The following medication(s) was prescribed: lidocaine topical 4 % cream cream topical was prescribed at facility WOUND #16: - Calcaneus Wound Laterality: Left Cleanser: Soap and Water 1 x Per Day/30 Days Discharge Instructions: May shower and wash wound with dial antibacterial soap and water prior to dressing change. Cleanser: Wound Cleanser 1 x  Per Day/30 Days Discharge Instructions: Cleanse the wound with wound cleanser prior to applying a clean dressing using gauze sponges, not tissue or cotton balls. Prim Dressing: KerraCel Ag Gelling Fiber Dressing, 2x2 in (silver alginate) 1 x Per Day/30 Days ary Discharge Instructions: Apply silver alginate to wound bed as instructed Secondary Dressing: Optifoam Non-Adhesive Dressing, 4x4 in 1 x Per Day/30 Days Discharge Instructions: Apply over primary dressing as directed. Secondary Dressing: Woven Gauze Sponge, Non-Sterile 4x4 in 1 x Per Day/30 Days Discharge Instructions: Apply over primary dressing as directed. Secured With: [employer], 4.5x3.1 (in/yd) 1 x Per Day/30 Days Discharge Instructions: Secure with Kerlix as directed. Secured With: 86M Medipore H Soft Cloth Surgical T ape, 4 x 10 (in/yd) 1 x Per Day/30 Days Discharge Instructions: Secure with tape as directed. 1. Debridement as noted 2. Still silver alginate as the primary dressing with covering foam 3. Follow-up in 2 weeks Electronic Signature(s) Signed: 10/09/2022 5:01:06 PM By: Linton Ham MD Entered By: Linton Ham on 10/09/2022 16:35:27 -------------------------------------------------------------------------------- SuperBill Details Patient Name: Date of Service: [name] [name], [name] NNA [name]. 10/09/2022 Medical Record Number: [id number] Patient Account Number: [id number] Date of Birth/Sex: Treating RN: 12-15-1955 (66 y.o. 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, bilateral I10 Essential (primary) hypertension E11.621  Type 2 diabetes mellitus with foot ulcer M46.25 Osteomyelitis of vertebra, thoracolumbar  region E11.42 Type 2 diabetes mellitus with diabetic polyneuropathy Facility Procedures : CPT4 Code: 75170017 Description: 49449 - DEBRIDE WOUND 1ST 20 SQ CM OR < ICD-10 Diagnosis Description L97.422 Non-pressure chronic ulcer of left heel and midfoot with fat layer exposed Modifier: Quantity: 1 Physician Procedures : CPT4 Code Description Modifier [id number] - WC PHYS DEBR WO ANESTH 20 SQ CM ICD-10 Diagnosis Description L97.422 Non-pressure chronic ulcer of left heel and midfoot with fat layer exposed TECLA, MAILLOUX [name] ([id number])  [id number] Page Quantity: 1 13 of 13 Electronic Signature(s) Signed: 10/09/2022 5:01:06 PM By: Linton Ham MD Entered By: Linton Ham on 10/09/2022 16:35:39

## 2022-10-10 NOTE — Telephone Encounter (Signed)
[place] NP notified of creat-4.44.  No new orders received at this time.

## 2022-10-10 NOTE — Progress Notes (Signed)
[name], BOBST [name] ([id number]) [id number] Page 1 of 8 Visit Report for 10/09/2022 Arrival Information Details Patient Name: Date of Service: [name] [name], [place] NNA [name]. 10/09/2022 3:30 PM Medical Record Number: [id number] Patient Account Number: [id number] Date of Birth/Sex: Treating RN: [birth year] (66 y.o. Donalda Ewings Primary Care Nylee Barbuto: [name] Other Clinician: Referring Tekia Waterbury: Treating Lorik Guo/Extender: [name] in Treatment: 6 Visit Information History Since Last Visit Added or deleted any medications: No Patient Arrived: Ambulatory Any new allergies or adverse reactions: No Arrival Time: 15:38 Had a fall or experienced change in No Accompanied By: Husband activities of daily living that may affect Transfer Assistance: None risk of falls: Patient Identification Verified: Yes Signs or symptoms of abuse/neglect since last visito No Secondary Verification Process Completed: Yes Hospitalized since last visit: No Patient Has Alerts: Yes Implantable device outside of the clinic excluding No Patient Alerts: Patient on Blood Thinner cellular tissue based products placed in the center since last visit: Has Dressing in Place as Prescribed: No Pain Present Now: Yes Electronic Signature(s) Signed: 10/09/2022 4:35:13 PM By: Sharyn Creamer RN, BSN Entered By: Sharyn Creamer on 10/09/2022 15:39:16 -------------------------------------------------------------------------------- Encounter Discharge Information Details Patient Name: Date of Service: [name] [name], [name] [name]. 10/09/2022 3:30 PM Medical Record Number: [id number] Patient Account Number: [id number] Date of Birth/Sex: Treating RN: [birth year] (66 y.o. [name] [name] Primary Care Aizlyn Schifano: [name] Other Clinician: Referring Rupinder Livingston: Treating Dayne Dekay/Extender: [name] in Treatment: 6 Encounter Discharge  Information Items Post Procedure Vitals Discharge Condition: Stable Temperature (F): 97.8 Ambulatory Status: Wheelchair Pulse (bpm): 55 Discharge Destination: Home Respiratory Rate (breaths/min): 18 Transportation: Private Auto Blood Pressure (mmHg): 182/64 Accompanied By: self Schedule Follow-up Appointment: Yes Clinical Summary of Care: Patient Declined Electronic Signature(s) Signed: 10/09/2022 4:59:49 PM By: Adline Peals Entered By: Adline Peals on 10/09/2022 16:00:14 [name] ([id number]) [id number] Page 2 of 8 -------------------------------------------------------------------------------- Lower Extremity Assessment Details Patient Name: Date of Service: [name] [name] [name], [place] NNA [name]. 10/09/2022 3:30 PM Medical Record Number: [id number] Patient Account Number: [id number] Date of Birth/Sex: Treating RN: Nov 19, 1955 (66 y.o. Donalda Ewings Primary Care [name]: [name] Other Clinician: Referring Marvin Maenza: Treating Danilynn Jemison/Extender: [name] in Treatment: 6 Edema Assessment Assessed: Shirlyn Goltz: No] [Right: No] Edema: [Left: Ye] [Right: s] Calf Left: Right: Point of Measurement: From Medial Instep 34.5 cm Ankle Left: Right: Point of Measurement: From Medial Instep 21 cm Vascular Assessment Pulses: Dorsalis Pedis Palpable: [Left:Yes] Electronic Signature(s) Signed: 10/09/2022 4:35:13 PM By: Sharyn Creamer RN, BSN Entered By: Sharyn Creamer on 10/09/2022 15:41:33 -------------------------------------------------------------------------------- Multi Wound Chart Details Patient Name: Date of Service: [name] [name], [name] [name]. 10/09/2022 3:30 PM Medical Record Number: [id number] Patient Account Number: [id number] Date of Birth/Sex: Treating RN: [birth year] (66 y.o. F) Primary Care [name] [name]: [name] Other Clinician: Referring [name]: Treating Hallie Ishida/Extender: [name] in Treatment: 6 Vital Signs Height(in): Capillary Blood Glucose(mg/dl): 109 Weight(lbs): Pulse(bpm): 24 Body Mass Index(BMI): Blood Pressure(mmHg): 182/64 Temperature(F): 97.8 Respiratory Rate(breaths/min): 18 [16:Photos:] [N/A:N/A] Left Calcaneus N/A N/A Wound Location: Gradually Appeared N/A N/A Wounding Event: Diabetic Wound/Ulcer of the Lower N/A N/A Primary Etiology: Extremity Cataracts, Anemia, Sleep Apnea, N/A N/A Comorbid History: Coronary Artery Disease, Hypertension, Myocardial Infarction, Type II Diabetes, Osteoarthritis, Osteomyelitis, Neuropathy 10/31/2021 N/A N/A Date Acquired: 6 N/A N/A Weeks of Treatment: Open N/A N/A Wound Status: No N/A N/A Wound Recurrence: 1x1x0.2 N/A N/A Measurements L x W x D (cm) 0.785 N/A N/A A (cm) :  rea 0.157 N/A N/A Volume (cm) : -11.00% N/A N/A % Reduction in A rea: 25.90% N/A N/A % Reduction in Volume: Grade 2 N/A N/A Classification: Medium N/A N/A Exudate A mount: Serosanguineous N/A N/A Exudate Type: red, brown N/A N/A Exudate Color: Distinct, outline attached N/A N/A Wound Margin: Large (67-100%) N/A N/A Granulation A mount: Red N/A N/A Granulation Quality: Small (1-33%) N/A N/A Necrotic A mount: Fat Layer (Subcutaneous Tissue): Yes N/A N/A Exposed Structures: Fascia: No Tendon: No Muscle: No Joint: No Bone: No None N/A N/A Epithelialization: Debridement - Selective/Open Wound N/A N/A Debridement: Pre-procedure Verification/Time Out 15:56 N/A N/A Taken: Lidocaine 4% Topical Solution N/A N/A Pain Control: Necrotic/Eschar N/A N/A Tissue Debrided: Skin/Epidermis N/A N/A Level: 1 N/A N/A Debridement A (sq cm): rea Curette N/A N/A Instrument: Minimum N/A N/A Bleeding: Pressure N/A N/A Hemostasis A chieved: Procedure was tolerated well N/A N/A Debridement Treatment Response: 1x1x0.2 N/A N/A Post Debridement Measurements L x W x D (cm) 0.157 N/A  N/A Post Debridement Volume: (cm) Callus: Yes N/A N/A Periwound Skin Texture: Dry/Scaly: Yes N/A N/A Periwound Skin Moisture: No Abnormalities Noted N/A N/A Periwound Skin Color: No Abnormality N/A N/A Temperature: Debridement N/A N/A Procedures Performed: Treatment Notes Wound #16 (Calcaneus) Wound Laterality: Left Cleanser Soap and Water Discharge Instruction: May shower and wash wound with dial antibacterial soap and water prior to dressing change. Wound Cleanser Discharge Instruction: Cleanse the wound with wound cleanser prior to applying a clean dressing using gauze sponges, not tissue or cotton balls. Peri-Wound Care Topical Primary Dressing KerraCel Ag Gelling Fiber Dressing, 2x2 in (silver alginate) Discharge Instruction: Apply silver alginate to wound bed as instructed Secondary Dressing Optifoam Non-Adhesive Dressing, 4x4 in Discharge Instruction: Apply over primary dressing as directed. Woven Gauze Sponge, Non-Sterile 4x4 in Discharge Instruction: Apply over primary dressing as directed. Secured With [employer], 4.5x3.1 (in/yd) Discharge Instruction: Secure with Kerlix as directed. TARAYA, STEWARD [name] ([id number]) [id number] Page 4 of 8 10M Medipore H Soft Cloth Surgical T ape, 4 x 10 (in/yd) Discharge Instruction: Secure with tape as directed. Compression Wrap Compression Stockings Add-Ons Electronic Signature(s) Signed: 10/09/2022 5:01:06 PM By: Linton Ham MD Entered By: Linton Ham on 10/09/2022 16:32:13 -------------------------------------------------------------------------------- Multi-Disciplinary Care Plan Details Patient Name: Date of Service: [place], [name] [name]. 10/09/2022 3:30 PM Medical Record Number: [id number] Patient Account Number: [id number] Date of Birth/Sex: Treating RN: [birth year] (66 y.o. [name] [name] Primary Care Larkin Alfred: [name] Other Clinician: Referring  Amori Cooperman: Treating Aspasia Rude/Extender: [name] in Treatment: 6 Active Inactive Pressure Nursing Diagnoses: Potential for impaired tissue integrity related to pressure, friction, moisture, and shear Goals: Patient will remain free of pressure ulcers Date Initiated: 08/25/2022 Target Resolution Date: 12/01/2022 Goal Status: Active Interventions: Assess: immobility, friction, shearing, incontinence upon admission and as needed Assess offloading mechanisms upon admission and as needed Notes: Wound/Skin Impairment Nursing Diagnoses: Knowledge deficit related to ulceration/compromised skin integrity Goals: Patient/caregiver will verbalize understanding of skin care regimen Date Initiated: 08/25/2022 Target Resolution Date: 12/01/2022 Goal Status: Active Interventions: Assess patient/caregiver ability to obtain necessary supplies Treatment Activities: Skin care regimen initiated : 08/25/2022 Topical wound management initiated : 08/25/2022 Notes: Electronic Signature(s) Signed: 10/09/2022 4:59:49 PM By: Adline Peals Entered By: Adline Peals on 10/09/2022 15:58:40 [name] [name] ([id number]) [id number] Page 5 of 8 -------------------------------------------------------------------------------- Pain Assessment Details Patient Name: Date of Service: [place], [place] NNA [name]. 10/09/2022 3:30 PM Medical Record Number: [id number] Patient Account Number: [id number] Date of Birth/Sex: Treating RN: 10/11/1956 (  66 y.o. Donalda Ewings Primary Care Gregorio Worley: [name] Other Clinician: Referring Karder Goodin: Treating Galen Malkowski/Extender: [name] in Treatment: 6 Active Problems Location of Pain Severity and Description of Pain Patient Has Paino Yes Site Locations Rate the pain. Current Pain Level: 8 Character of Pain Describe the Pain: Aching, Burning, Throbbing Pain Management and  Medication Current Pain Management: Medication: Yes Electronic Signature(s) Signed: 10/09/2022 4:35:13 PM By: Sharyn Creamer RN, BSN Entered By: Sharyn Creamer on 10/09/2022 15:40:50 -------------------------------------------------------------------------------- Patient/Caregiver Education Details Patient Name: Date of Service: [name] [name], [name] [name]. 12/11/2023andnbsp3:30 PM Medical Record Number: [id number] Patient Account Number: [id number] Date of Birth/Gender: Treating RN: 1956/01/17 (66 y.o. [name] [name] Primary Care Physician: [name] Other Clinician: Referring Physician: Treating Physician/Extender: [name] in Treatment: 6 Education Assessment Education Provided To: Patient Education Topics Provided Wound/Skin ImpairmentKYLENE, ZAMARRON ([id number]) [id number] Page 6 of 8 Methods: Explain/Verbal Responses: Reinforcements needed, State content correctly Electronic Signature(s) Signed: 10/09/2022 4:59:49 PM By: Adline Peals Entered By: Adline Peals on 10/09/2022 15:58:55 -------------------------------------------------------------------------------- Wound Assessment Details Patient Name: Date of Service: [place], [name] [name]. 10/09/2022 3:30 PM Medical Record Number: [id number] Patient Account Number: [id number] Date of Birth/Sex: Treating RN: 02-Jul-1956 (66 y.o. Donalda Ewings Primary Care Vihaan Gloss: [name] Other Clinician: Referring Dantonio Justen: Treating Smayan Hackbart/Extender: [name] in Treatment: 6 Wound Status Wound Number: 16 Primary Diabetic Wound/Ulcer of the Lower Extremity Etiology: Wound Location: Left Calcaneus Wound Open Wounding Event: Gradually Appeared Status: Date Acquired: 10/31/2021 Comorbid Cataracts, Anemia, Sleep Apnea, Coronary Artery Disease, Weeks Of Treatment: 6 History: Hypertension, Myocardial Infarction,  Type II Diabetes, Clustered Wound: No Osteoarthritis, Osteomyelitis, Neuropathy Photos Wound Measurements Length: (cm) 1 Width: (cm) 1 Depth: (cm) 0.2 Area: (cm) 0.785 Volume: (cm) 0.157 % Reduction in Area: -11% % Reduction in Volume: 25.9% Epithelialization: None Tunneling: No Undermining: No Wound Description Classification: Grade 2 Wound Margin: Distinct, outline attached Exudate Amount: Medium Exudate Type: Serosanguineous Exudate Color: red, brown Foul Odor After Cleansing: No Slough/Fibrino No Wound Bed Granulation Amount: Large (67-100%) Exposed Structure Granulation Quality: Red Fascia Exposed: No Necrotic Amount: Small (1-33%) Fat Layer (Subcutaneous Tissue) Exposed: Yes Necrotic Quality: Adherent Slough Tendon Exposed: No Muscle Exposed: No Joint Exposed: No Bone Exposed: No [address] DORLEEN, KISSEL [name] ([id number]) [id number] Page 7 of 8 No Abnormalities Noted: No No Abnormalities Noted: Yes Callus: Yes Temperature / Pain Temperature: No Abnormality Moisture No Abnormalities Noted: No Dry / Scaly: Yes Treatment Notes Wound #16 (Calcaneus) Wound Laterality: Left Cleanser Soap and Water Discharge Instruction: May shower and wash wound with dial antibacterial soap and water prior to dressing change. Wound Cleanser Discharge Instruction: Cleanse the wound with wound cleanser prior to applying a clean dressing using gauze sponges, not tissue or cotton balls. Peri-Wound Care Topical Primary Dressing KerraCel Ag Gelling Fiber Dressing, 2x2 in (silver alginate) Discharge Instruction: Apply silver alginate to wound bed as instructed Secondary Dressing Optifoam Non-Adhesive Dressing, 4x4 in Discharge Instruction: Apply over primary dressing as directed. Woven Gauze Sponge, Non-Sterile 4x4 in Discharge Instruction: Apply over primary dressing as directed. Secured With [employer], 4.5x3.1  (in/yd) Discharge Instruction: Secure with Kerlix as directed. 39M Medipore H Soft Cloth Surgical T ape, 4 x 10 (in/yd) Discharge Instruction: Secure with tape as directed. Compression Wrap Compression Stockings Add-Ons Electronic Signature(s) Signed: 10/09/2022 4:35:13 PM By: Sharyn Creamer RN, BSN Entered By: Sharyn Creamer on 10/09/2022 15:46:46 -------------------------------------------------------------------------------- [place] Details Patient Name:  Date of Service: [name] [name] [name], [place] NNA [name]. 10/09/2022 3:30 PM Medical Record Number: [id number] Patient Account Number: [id number] Date of Birth/Sex: Treating RN: 1956/04/25 (66 y.o. Donalda Ewings Primary Care Latisha Lasch: [name] Other Clinician: Referring Jackson Coffield: Treating Shamonica Schadt/Extender: [name] in Treatment: 6 Vital Signs Time Taken: 15:38 Temperature (F): 97.8 Pulse (bpm): 55 Respiratory Rate (breaths/min): 18 Blood Pressure (mmHg): 182/64 Capillary Blood Glucose (mg/dl): 109 Reference Range: 80 - 120 mg / dl Electronic Signature(s) Signed: 10/09/2022 4:35:13 PM By: Sharyn Creamer RN, BSN Niebuhr, DIANNA12/08/2022 4:35:13 PM By: Sharyn Creamer RN, BSN Signed: R ([id number]) [id number] Page 8 of 8 Entered By: Sharyn Creamer on 10/09/2022 15:40:19

## 2022-10-10 NOTE — Patient Instructions (Signed)
Epoetin Alfa Injection What is this medication? EPOETIN ALFA (e POE e tin AL fa) treats low levels of red blood cells (anemia) caused by kidney disease, chemotherapy, or HIV medications. It can also be used in people who are at risk for blood loss during surgery. It works by helping your body make more red blood cells, which reduces the need for blood transfusions. This medicine may be used for other purposes; ask your health care provider or pharmacist if you have questions. COMMON BRAND NAME(S): Epogen, Procrit, Retacrit What should I tell my care team before I take this medication? They need to know if you have any of these conditions: Blood clots Cancer Heart disease High blood pressure On dialysis Seizures Stroke An unusual or allergic reaction to epoetin alfa, albumin, benzyl alcohol, other medications, foods, dyes, or preservatives Pregnant or trying to get pregnant Breast-feeding How should I use this medication? This medication is injected into a vein or under the skin. It is usually given by your care team in a hospital or clinic setting. It may also be given at home. If you get this medication at home, you will be taught how to prepare and give it. Use exactly as directed. Take it as directed on the prescription label at the same time every day. Keep taking it unless your care team tells you to stop. It is important that you put your used needles and syringes in a special sharps container. Do not put them in a trash can. If you do not have a sharps container, call your pharmacist or care team to get one. A special MedGuide will be given to you by the pharmacist with each prescription and refill. Be sure to read this information carefully each time. Talk to your care team about the use of this medication in children. While this medication may be used in children as young as 1 month of age for selected conditions, precautions do apply. Overdosage: If you think you have taken too much  of this medicine contact a poison control center or emergency room at once. NOTE: This medicine is only for you. Do not share this medicine with others. What if I miss a dose? If you miss a dose, take it as soon as you can. If it is almost time for your next dose, take only that dose. Do not take double or extra doses. What may interact with this medication? Darbepoetin alfa Methoxy polyethylene glycol-epoetin beta This list may not describe all possible interactions. Give your health care provider a list of all the medicines, herbs, non-prescription drugs, or dietary supplements you use. Also tell them if you smoke, drink alcohol, or use illegal drugs. Some items may interact with your medicine. What should I watch for while using this medication? Visit your care team for regular checks on your progress. Check your blood pressure as directed. Know what your blood pressure should be and when to contact your care team. Your condition will be monitored carefully while you are receiving this medication. You may need blood work while taking this medication. What side effects may I notice from receiving this medication? Side effects that you should report to your care team as soon as possible: Allergic reactions--skin rash, itching, hives, swelling of the face, lips, tongue, or throat Blood clot--pain, swelling, or warmth in the leg, shortness of breath, chest pain Heart attack--pain or tightness in the chest, shoulders, arms, or jaw, nausea, shortness of breath, cold or clammy skin, feeling faint or lightheaded Increase   in blood pressure Rash, fever, and swollen lymph nodes Redness, blistering, peeling, or loosening of the skin, including inside the mouth Seizures Stroke--sudden numbness or weakness of the face, arm, or leg, trouble speaking, confusion, trouble walking, loss of balance or coordination, dizziness, severe headache, change in vision Side effects that usually do not require medical  attention (report to your care team if they continue or are bothersome): Bone, joint, or muscle pain Cough Headache Nausea Pain, redness, or irritation at injection site This list may not describe all possible side effects. Call your doctor for medical advice about side effects. You may report side effects to FDA at 1-800-FDA-1088. Where should I keep my medication? Keep out of the reach of children and pets. Store in a refrigerator. Do not freeze. Do not shake. Protect from light. Keep this medication in the original container until you are ready to take it. See product for storage information. Get rid of any unused medication after the expiration date. To get rid of medications that are no longer needed or have expired: Take the medication to a medication take-back program. Check with your pharmacy or law enforcement to find a location. If you cannot return the medication, ask your pharmacist or care team how to get rid of the medication safely. NOTE: This sheet is a summary. It may not cover all possible information. If you have questions about this medicine, talk to your doctor, pharmacist, or health care provider.  2023 Elsevier/Gold Standard (2022-01-24 00:00:00)  

## 2022-10-10 NOTE — Progress Notes (Signed)
Patient here today for Evenity injection, per physician's orders.  Evenity was administered R arm today. Patient tolerated injection.  Patient due for follow up labs/provider appt: No.  Patient next injection due: 1 month, appt made: yes

## 2022-10-17 ENCOUNTER — Other Ambulatory Visit: Payer: Self-pay | Admitting: Family Medicine

## 2022-10-17 MED ORDER — PREGABALIN 25 MG PO CAPS: 25.0000 mg | ORAL_CAPSULE | Freq: Three times a day (TID) | ORAL | 1 refills | Status: DC

## 2022-10-17 NOTE — Telephone Encounter (Signed)
Medication: pregabalin (LYRICA) 25 MG capsule   Has the patient contacted their pharmacy? No.   Preferred Pharmacy (with phone number or street name): [facility] [address] [place], [place] - [address] [place] [address] Phone: [phone]  Fax: [phone]   Agent: Please be advised that RX refills may take up to 3 business days. We ask that you follow-up with your pharmacy.

## 2022-10-17 NOTE — Telephone Encounter (Signed)
Requesting: Lyrica Contract: 02/14/2022 UDS: 02/14/2022 Last OV: 09/08/2022 Next OV: N/A Last Refill: 07/11/2022, #60--3 RF Database:   Please advise

## 2022-10-19 ENCOUNTER — Encounter: Admit: 2022-10-19 | Discharge: 2022-10-19 | Payer: MEDICARE

## 2022-10-19 ENCOUNTER — Ambulatory Visit: Admit: 2022-10-19 | Discharge: 2022-10-20 | Payer: MEDICARE

## 2022-10-19 NOTE — Progress Notes
An initial comprehensive medication management visit was completed today via telephone.    Referral reason: Hypertension Management and [facility]  Referring provider: Dr. Sherlyn Hay, MD    [facility] enrollment date: 10/02/2022  [facility] study end date: 10/02/2024  Study coordinator: Shelbie Ammons    Assessment & Plan     Hypertension  Patient's blood pressure is uncontrolled per clinic readings and Qardio report. They have had a microalbumin completed. They have normal-mild albuminuria present. Their goal blood pressure is < 130/80 mmHg based on 2018 ACC/AHA guidelines.    Qardio Home Blood Pressure Readings:  7 day average: 145/84 mmHg, HR: 70  1 month average: 142/85 mmHg, HR: 72  Patient reports she did try a couple clonidine patches for a couple days and they made her feel sluggish so she has not been utilizing them for blood pressure control. She does not recall what her blood pressures were reading while utilizing the clonidine patches. Today, we discussed blood pressure technique. Patient does note her neighbor contributes to a higher stress level    Plan  START:  -Checking blood pressure in the morning prior to coffee and in the afternoon around 3PM  -Ensure resting 5-10 minutes before blood pressure reading     CONTINUE:  Irbesartan to 300mg  daily at bedtime  Amlodipine 10mg  daily in the morning   Carvedilol 25mg  po twice daily with breakfast and dinner     Future considerations:  Patient does report she has clonidine patches at home      Follow-up  The patient will continue to follow up with the pharmacist. Return to pharmacist in 1 month via telephone. The return visit was scheduled during today's visit.    Subjective & Objective    [name] Fletcher was referred to the pharmacy team for hypertension management. She does report dizziness all the time.    Hypertension    HPI  Current hypertension medication regimen:   Irbesartan to 300mg  daily (maximum dose)  Amlodipine 10mg  daily (maximum dose)  Carvedilol 25mg  po twice daily  Previous medications for hypertension: Hctz (nausea/vomiting), spironolactone (nausea/vomiting), hydralazine (burned stomach), clonidine (felt sluggish)    The baseline blood pressure was 143/75 on 08/31/2022.    Lifestyle  Physical activity/exercise: yes. Tries to go to the gym. Stopped going when she got sick. Wants to go to the gym 3-4 times per week in the new year.  Adds salt to food: no. sometimes add seasoning salt  Caffeine consumption: yes. 1-2 cups of coffee each morning  Elevated stress: yes. neighbor does cause stress  Alcohol use: yes. 1 glass of wine every once in a while  Tobacco use: no    Monitoring  Patient instructed to check blood pressure 2 time(s) per day.  Cuff type: Qardio  Appropriate technique: no. checking morning blood pressure within 30 minutes of having coffee.    Symptom Review  Hypotension: no  Chest pain: no  Shortness of breath: no  Lower extremity edema: no. High blood pressure symptoms include lightheadedness, headache, change in vision    Comorbidities  ASCVD condition(s): none  CKD: yes  Diabetes: yes (type 2 and uncontrolled)  Dyslipidemia: yes    Health Maintenance  Aspirin utilization: Patient is not taking aspirin due to the following reason(s): not indicated.  Statin utilization: Patient is not taking a statin.    Qardio Blood Pressure Readings:        Primary Care - Labs  BP Readings from Last 3 Encounters:   08/31/22 [name] Kitchen)  143/75   07/21/22 (P) 131/65   07/19/22 (!) 144/62     Pulse Readings from Last 3 Encounters:   08/31/22 63   07/21/22 69   07/19/22 95     Comprehensive Metabolic Profile    Lab Results   Component Value Date/Time    NA 137 08/16/2022 09:50 AM    K 4.4 08/16/2022 09:50 AM    CL 105 08/16/2022 09:50 AM    CO2 21 08/16/2022 09:50 AM    GAP 11 08/16/2022 09:50 AM    BUN 33 (H) 08/16/2022 09:50 AM    CR 1.88 (H) 08/16/2022 09:50 AM    GLU 276 (H) 08/16/2022 09:50 AM    GLU 146 (H) 02/21/2018 09:45 AM    Lab Results Component Value Date/Time    CA 9.4 08/16/2022 09:50 AM    PO4 3.1 07/25/2022 12:07 PM    ALBUMIN 4.1 07/25/2022 12:07 PM    TOTPROT 7.4 07/25/2022 12:07 PM    ALKPHOS 82 07/25/2022 12:07 PM    AST 13 07/25/2022 12:07 PM    ALT 12 07/25/2022 12:07 PM    TOTBILI 0.5 07/25/2022 12:07 PM    GFR 33 (L) 08/24/2020 09:30 AM    GFRAA 40 (L) 08/24/2020 09:30 AM        eGFR   Date Value Ref Range Status   08/16/2022 29 (L) >60 mL/min Final     Comment:     eGFR calculated using the CKD-EPIcr_R equation     Lab Results   Component Value Date/Time    URMALBCRRAT 54.62 (H) 02/28/2022 10:39 AM     BMI Readings from Last 1 Encounters:   08/31/22 29.26 kg/m?     The 10-year ASCVD risk score (Arnett DK, et al., 2019) is: 28.1%    Values used to calculate the score:      Age: 28 years      Sex: Female      Is Non-Hispanic African American: Yes      Diabetic: Yes      Tobacco smoker: No      Systolic Blood Pressure: 143 mmHg      Is BP treated: Yes      HDL Cholesterol: 97 MG/DL      Total Cholesterol: 232 MG/DL     Medication History  Medication history was completed and the patient's medication list was updated to reflect what they are currently taking.    Drug-drug interactions were evaluated. There were not clinically significant drug-drug interactions.    Adverse Drug Reactions  Adverse drug reactions were reviewed with the patient.    Significant adverse drug reaction(s) were not identified.    Side effect(s) were not reported.    Refills needed: no    Home Medications    Medication Sig   ACCU-CHEK AVIVA PLUS TEST STRP test strip TEST EVERY DAY BEFORE BREAKFAST AS DIRECTED   ACCU-CHEK GUIDE GLUCOSE METER kit USE AS DIRECTED TO CHECK BLOOD SUGARS   albuterol sulfate (PROAIR HFA) 90 mcg/actuation HFA aerosol inhaler Inhale two puffs by mouth into the lungs every 6 hours as needed.   amLODIPine (NORVASC) 10 mg tablet TAKE 1 TABLET EVERY DAY   azelastine (ASTELIN) 137 mcg (0.1 %) nasal spray Apply two sprays to each nostril as directed twice daily.   betamethasone valerate (VALISONE) 0.1 % topical cream APPLY TOPICALLY TO AFFECTED AREA DAILY   Blood Glucose Control, Normal soln Use in glucose meter, Accu-check Aviva   budesonide-formoterol HFA (SYMBICORT) 160-4.5 mcg/actuation  aerosol inhaler Inhale two puffs by mouth into the lungs twice daily.   carvediloL (COREG) 25 mg tablet Take one tablet by mouth twice daily with meals. Take with food.   cetirizine (ZYRTEC) 10 mg tablet Take one tablet by mouth daily.     cholecalciferol(+) (VITAMIN D-3) 2,000 unit tablet Take 1 Tab by mouth daily.  Patient taking differently: Take one tablet by mouth daily. 5,000 units daily   cloNIDine (CATAPRES-TTS-1) 0.1 mg/day patch Apply one patch to top of skin as directed every 7 days.   cyanocobalamin (vitamin B-12) 5,000 mcg cap Take 1 tablet by mouth daily.   cyclobenzaprine (FLEXERIL) 5 mg tablet TAKE 1 TABLET AT BEDTIME   DROPSAFE ALCOHOL PREP PADS USE TO CLEAN SKIN BEFORE FINGERSTICK   ezetimibe (ZETIA) 10 mg tablet Take one tablet by mouth daily.   famotidine (PEPCID) 20 mg tablet TAKE 1 TABLET TWICE DAILY   ferrous sulfate (FEOSOL) 325 mg (65 mg iron) tablet Take one tablet by mouth every 48 hours.   fluticasone propionate (FLONASE) 50 mcg/actuation nasal spray, suspension USE 2 SPRAYS IN EACH NOSTRIL AS DIRECTED DAILY. SHAKE BOTTLE GENTLY BEFORE USING   glimepiride (AMARYL) 4 mg tablet Take one tablet by mouth every morning.   irbesartan (AVAPRO) 300 mg tablet Take one tablet by mouth at bedtime daily.   lancets MISC Use one each as directed daily before breakfast. Diag: E11.29   mirtazapine (REMERON) 15 mg tablet TAKE ONE TABLET BY MOUTH AT BEDTIME DAILY.   pantoprazole DR (PROTONIX) 40 mg tablet Take one tablet by mouth daily.   pioglitazone (ACTOS) 45 mg tablet Take one tablet by mouth daily.      Education  Education provided: yes    Hypertension  Education components: BP goal and disease risk factors, low salt diet encouraged, correct BP monitoring technique and encouraged patient to monitor BP at home    Blood Pressure Technique - discussed correct technique for checking blood pressure:    - Check near the same time of day when possible  - Be consistent in the order you check your blood pressure and take your medications  - Do not drink caffeine or use tobacco products within 30 minutes before checking  - Rest for 5 minutes before checking  - Ensure proper positioning of the blood pressure cuff on your arm   - Ensure your feet are flat on the floor  - Ensure your arm is supported at the chest level  - Ensure no other electronics are touching yourself or the chair/table your arm is resting on    Time spent with patient: 50 minutes    Thank you for allowing me to participate in the care of your patient,  Lynelle Smoke, [facility]  [facility] Clinical Pharmacist

## 2022-10-23 ENCOUNTER — Other Ambulatory Visit: Payer: Self-pay | Admitting: Family Medicine

## 2022-10-23 DIAGNOSIS — Z981 Arthrodesis status: Secondary | ICD-10-CM

## 2022-10-23 DIAGNOSIS — G894 Chronic pain syndrome: Secondary | ICD-10-CM

## 2022-10-24 ENCOUNTER — Encounter (HOSPITAL_BASED_OUTPATIENT_CLINIC_OR_DEPARTMENT_OTHER): Payer: PRIVATE HEALTH INSURANCE | Admitting: General Surgery

## 2022-10-24 NOTE — Telephone Encounter (Signed)
Forwarding to Rx Prior [employer].

## 2022-10-26 ENCOUNTER — Encounter: Admit: 2022-10-26 | Discharge: 2022-10-26 | Payer: MEDICARE

## 2022-10-26 NOTE — Telephone Encounter
Pt calls that she has episode on on 10-24-22 and her BP was 192/90. EMS was called but she refused to go to hosp. Pt states she was stressed by situations at her home that day. The next morning her BP was 137/89.  Today 144/89.    Denies any chest pain or SOA.     She states she has not been paid for the [facility] program she is enrolled in to monitor her BP.

## 2022-11-01 ENCOUNTER — Other Ambulatory Visit (HOSPITAL_COMMUNITY): Payer: Self-pay

## 2022-11-01 NOTE — Telephone Encounter (Signed)
Evenity VOB initiated via [email or web address]

## 2022-11-02 ENCOUNTER — Other Ambulatory Visit: Payer: Self-pay | Admitting: Family Medicine

## 2022-11-02 DIAGNOSIS — Z981 Arthrodesis status: Secondary | ICD-10-CM

## 2022-11-02 DIAGNOSIS — G894 Chronic pain syndrome: Secondary | ICD-10-CM

## 2022-11-02 DIAGNOSIS — M8610 Other acute osteomyelitis, unspecified site: Secondary | ICD-10-CM

## 2022-11-02 NOTE — Telephone Encounter (Signed)
Medication: HYDROcodone-acetaminophen (NORCO/VICODIN) 5-325 MG tablet  Has the patient contacted their pharmacy? No.   Preferred Pharmacy:   [id number] - [place], [place] - [address], [place] [address] Phone: [phone]  Fax: [phone] 

## 2022-11-03 ENCOUNTER — Telehealth: Payer: Self-pay | Admitting: Family Medicine

## 2022-11-03 MED ORDER — HYDROCODONE-ACETAMINOPHEN 5-325 MG PO TABS: 1.0000 | ORAL_TABLET | Freq: Four times a day (QID) | ORAL | 0 refills | Status: DC | PRN

## 2022-11-03 NOTE — Telephone Encounter (Signed)
Requesting: NORCO Contract: 02/14/2022 UDS: 02/14/2022 Last OV: 09/07/2022 Next OV: N/A Last Refill: 10/05/2022, #120--0 RF Database:   Please advise

## 2022-11-03 NOTE — Telephone Encounter (Signed)
Patient called to follow up on prescription for hydrocodone. Advised it was sent back to provider. Patient said she needs it by tomorrow.

## 2022-11-04 ENCOUNTER — Other Ambulatory Visit: Payer: Self-pay | Admitting: Family Medicine

## 2022-11-04 DIAGNOSIS — R110 Nausea: Secondary | ICD-10-CM

## 2022-11-06 ENCOUNTER — Encounter: Admit: 2022-11-06 | Discharge: 2022-11-06 | Payer: MEDICARE

## 2022-11-06 MED ORDER — IRBESARTAN 300 MG PO TAB
300 mg | ORAL_TABLET | Freq: Every evening | ORAL | 3 refills | Status: AC
Start: 2022-11-06 — End: ?

## 2022-11-06 MED ORDER — PIOGLITAZONE 45 MG PO TAB
45 mg | ORAL_TABLET | Freq: Every day | ORAL | 3 refills | 90.00000 days | Status: AC
Start: 2022-11-06 — End: ?

## 2022-11-07 ENCOUNTER — Other Ambulatory Visit: Payer: Self-pay | Admitting: Family Medicine

## 2022-11-07 ENCOUNTER — Telehealth: Payer: Self-pay | Admitting: Family Medicine

## 2022-11-07 NOTE — Telephone Encounter (Signed)
Patient called to advise that her Lyrica prescription is supposed to be 75 mg and it was only called in for 25 mg so she had to take 3 pills and now the prescription is out. Please review and advise patient.

## 2022-11-07 NOTE — Telephone Encounter (Signed)
Please advise 

## 2022-11-08 ENCOUNTER — Inpatient Hospital Stay: Payer: PRIVATE HEALTH INSURANCE

## 2022-11-08 ENCOUNTER — Inpatient Hospital Stay (HOSPITAL_BASED_OUTPATIENT_CLINIC_OR_DEPARTMENT_OTHER): Payer: PRIVATE HEALTH INSURANCE | Admitting: Family

## 2022-11-08 ENCOUNTER — Other Ambulatory Visit: Payer: Self-pay

## 2022-11-08 ENCOUNTER — Encounter: Payer: Self-pay | Admitting: Family

## 2022-11-08 ENCOUNTER — Inpatient Hospital Stay: Payer: PRIVATE HEALTH INSURANCE | Attending: Hematology & Oncology

## 2022-11-08 ENCOUNTER — Encounter: Admit: 2022-11-08 | Discharge: 2022-11-08 | Payer: MEDICARE

## 2022-11-08 VITALS — BP 196/71 | HR 70 | Temp 98.2°F | Resp 18 | Ht 59.0 in

## 2022-11-08 DIAGNOSIS — G629 Polyneuropathy, unspecified: Secondary | ICD-10-CM | POA: Diagnosis not present

## 2022-11-08 DIAGNOSIS — D500 Iron deficiency anemia secondary to blood loss (chronic): Secondary | ICD-10-CM

## 2022-11-08 DIAGNOSIS — Z86718 Personal history of other venous thrombosis and embolism: Secondary | ICD-10-CM | POA: Diagnosis not present

## 2022-11-08 DIAGNOSIS — Z7901 Long term (current) use of anticoagulants: Secondary | ICD-10-CM | POA: Insufficient documentation

## 2022-11-08 DIAGNOSIS — N1832 Stage 3b chronic kidney disease (HCC): Secondary | ICD-10-CM

## 2022-11-08 DIAGNOSIS — N183 Chronic kidney disease, stage 3 unspecified: Secondary | ICD-10-CM | POA: Diagnosis present

## 2022-11-08 DIAGNOSIS — D631 Anemia in chronic kidney disease: Secondary | ICD-10-CM | POA: Insufficient documentation

## 2022-11-08 DIAGNOSIS — Z79899 Other long term (current) drug therapy: Secondary | ICD-10-CM | POA: Diagnosis not present

## 2022-11-08 DIAGNOSIS — I129 Hypertensive chronic kidney disease with stage 1 through stage 4 chronic kidney disease, or unspecified chronic kidney disease: Secondary | ICD-10-CM | POA: Diagnosis present

## 2022-11-08 LAB — CBC WITH DIFFERENTIAL (CANCER CENTER ONLY)
Abs Immature Granulocytes: 0.03 10*3/uL (ref 0.00–0.07)
Basophils Absolute: 0.1 10*3/uL (ref 0.0–0.1)
Basophils Relative: 1 %
Eosinophils Absolute: 0.2 10*3/uL (ref 0.0–0.5)
Eosinophils Relative: 4 %
HCT: 32 % — ABNORMAL LOW (ref 36.0–46.0)
Hemoglobin: 9.9 g/dL — ABNORMAL LOW (ref 12.0–15.0)
Immature Granulocytes: 1 %
Lymphocytes Relative: 13 %
Lymphs Abs: 0.8 10*3/uL (ref 0.7–4.0)
MCH: 28.2 pg (ref 26.0–34.0)
MCHC: 30.9 g/dL (ref 30.0–36.0)
MCV: 91.2 fL (ref 80.0–100.0)
Monocytes Absolute: 0.5 10*3/uL (ref 0.1–1.0)
Monocytes Relative: 8 %
Neutro Abs: 4.3 10*3/uL (ref 1.7–7.7)
Neutrophils Relative %: 73 %
Platelet Count: 160 10*3/uL (ref 150–400)
RBC: 3.51 MIL/uL — ABNORMAL LOW (ref 3.87–5.11)
RDW: 16 % — ABNORMAL HIGH (ref 11.5–15.5)
WBC Count: 5.8 10*3/uL (ref 4.0–10.5)
nRBC: 0 % (ref 0.0–0.2)

## 2022-11-08 LAB — CMP (CANCER CENTER ONLY)
ALT: 10 U/L (ref 0–44)
AST: 15 U/L (ref 15–41)
Albumin: 3.7 g/dL (ref 3.5–5.0)
Alkaline Phosphatase: 152 U/L — ABNORMAL HIGH (ref 38–126)
Anion gap: 9 (ref 5–15)
BUN: 71 mg/dL — ABNORMAL HIGH (ref 8–23)
CO2: 19 mmol/L — ABNORMAL LOW (ref 22–32)
Calcium: 9.2 mg/dL (ref 8.9–10.3)
Chloride: 108 mmol/L (ref 98–111)
Creatinine: 3.92 mg/dL (ref 0.44–1.00)
GFR, Estimated: 12 mL/min — ABNORMAL LOW (ref >60)
Glucose, Bld: 94 mg/dL (ref 70–99)
Potassium: 5.6 mmol/L — ABNORMAL HIGH (ref 3.5–5.1)
Sodium: 136 mmol/L (ref 135–145)
Total Bilirubin: 0.5 mg/dL (ref 0.3–1.2)
Total Protein: 8.5 g/dL — ABNORMAL HIGH (ref 6.5–8.1)

## 2022-11-08 LAB — FERRITIN: Ferritin: 258 ng/mL (ref 11–307)

## 2022-11-08 LAB — RETICULOCYTES
Immature Retic Fract: 8.6 % (ref 2.3–15.9)
RBC.: 3.5 MIL/uL — ABNORMAL LOW (ref 3.87–5.11)
Retic Count, Absolute: 71.8 10*3/uL (ref 19.0–186.0)
Retic Ct Pct: 2.1 % (ref 0.4–3.1)

## 2022-11-08 LAB — SAMPLE TO BLOOD BANK

## 2022-11-08 NOTE — Telephone Encounter (Signed)
Spoke with patient. Pt states taking tid

## 2022-11-08 NOTE — Progress Notes (Signed)
Hematology and Oncology Follow Up Visit  [name] [name] [id number] [birth year] 67 y.o. 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 However, we will have to hold her ESA today due to HTN. She did not take her antihypertensives this morning and BP is currently 196/71, HR 70.  She is symptomatic with mild headache a blurred vision. She has her Norvasc with her in her purse and took it while here in the office.  No fever, chills, cough, rash, SOB, chest pain, palpitations or changes in bowel or bladder habits.  She has issues with abdominal discomfort with constipation and takes a stool softener as needed which she states helps.  No notes occasional nausea, without vomiting.  No swelling in her extremities.  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  No falls or syncope reported. She is in her motorized scooter today. She uses a walker at home when ambulating.  Appetite and hydration are good.  ECOG Performance Status: 1 - Symptomatic but completely ambulatory  Medications:  Allergies as of 11/08/2022       Reactions   Bee Pollen Anaphylaxis   Broccoli [brassica Oleracea] Anaphylaxis, Hives   Fish-derived Products Anaphylaxis, Hives   Mushroom Extract Complex Anaphylaxis   Penicillins Anaphylaxis   **Tolerated cefepime March 2021 Did it involve swelling of the face/tongue/throat, SOB, or low BP? Yes Did it involve sudden or severe rash/hives, skin peeling, or any reaction on the inside of your mouth or nose? No Did you need to seek medical attention at a hospital or doctor's office? Yes When did it last  happen?  A few months ago If all above answers are "NO", may proceed with cephalosporin use.   Rosemary Oil Anaphylaxis   Shellfish Allergy Anaphylaxis, Hives   Tomato Anaphylaxis, Hives   Raw tomatoes only, can tolerate if cooked   Acetaminophen Other (See Comments)   GI upset   Aloe Vera Hives   Doxycycline Diarrhea   Acyclovir And Related Other (See Comments)   Unknown reaction   Naproxen Hives        Medication List        Accurate as of November 08, 2022  1:23 PM. If you have any questions, ask your nurse or doctor.          acetaminophen 325 MG tablet Commonly known as: TYLENOL Take 2 tablets (650 mg total) by mouth every 6 (six) hours as needed for mild pain, fever or headache (or Fever >/= 101). Discontinue 500/1000 mg dosing   Advair HFA 115-21 MCG/ACT inhaler Generic drug: fluticasone-salmeterol Inhale 2 puffs into the lungs 2 (two) times daily. What changed:  when to take this reasons to take this   albuterol (2.5 MG/3ML) 0.083% nebulizer solution Commonly known as: PROVENTIL Take 3 mLs (2.5 mg total) by nebulization every 6 (six) hours as needed for wheezing or shortness of breath.   albuterol 108 (90 Base) MCG/ACT inhaler Commonly known as: VENTOLIN HFA Inhale 2 puffs into the lungs every 6 (six) hours as needed for wheezing or shortness of breath.   amLODipine 5 MG tablet Commonly  known as: NORVASC Take 1 tablet (5 mg total) by mouth daily.   apixaban 2.5 MG Tabs tablet Commonly known as: Eliquis Take 1 tablet (2.5 mg total) by mouth 2 (two) times daily.   atorvastatin 10 MG tablet Commonly known as: LIPITOR Take 1 tablet (10 mg total) by mouth daily.   carvedilol 12.5 MG tablet Commonly known as: COREG Take 1 tablet (12.5 mg total) by mouth daily.   diazepam 5 MG tablet Commonly known as: VALIUM Take 1 tablet (5 mg total) by mouth every 12 (twelve) hours as needed for anxiety. What changed: when to take this   DULoxetine 60 MG  capsule Commonly known as: CYMBALTA Take 1 capsule by mouth once daily   ferrous sulfate 325 (65 FE) MG tablet Take 1 tablet (325 mg total) by mouth 2 (two) times daily with a meal.   folic acid 1 MG tablet Commonly known as: FOLVITE Take 2 tablets by mouth once daily   FreeStyle Libre 14 Day Reader Kerrin Mo 1 Device by Does not apply route daily.   FreeStyle Libre 14 Day Sensor Misc 1 Device by Does not apply route daily.   furosemide 20 MG tablet Commonly known as: LASIX Take 2 tablets (40 mg total) by mouth daily.   HYDROcodone-acetaminophen 5-325 MG tablet Commonly known as: NORCO/VICODIN Take 1 tablet by mouth every 6 (six) hours as needed for moderate pain.   Lantus SoloStar 100 UNIT/ML Solostar Pen Generic drug: insulin glargine INJECT 8 UNITS SUBCUTANEOUSLY once a day   lubiprostone 24 MCG capsule Commonly known as: AMITIZA Take 1 capsule (24 mcg total) by mouth 2 (two) times daily with a meal. What changed:  when to take this reasons to take this   montelukast 10 MG tablet Commonly known as: SINGULAIR Take 1 tablet (10 mg total) by mouth at bedtime.   multivitamin with minerals Tabs tablet Take 1 tablet by mouth daily.   NONFORMULARY OR COMPOUNDED ITEM Power wheelchair  #1  as directed   ondansetron 8 MG tablet Commonly known as: ZOFRAN TAKE 1/2 (ONE-HALF) TABLET BY MOUTH EVERY 8 HOURS AS NEEDED FOR NAUSEA FOR VOMITING   sodium bicarbonate 650 MG tablet Take 1 tablet (650 mg total) by mouth 2 (two) times daily.   tamsulosin 0.4 MG Caps capsule Commonly known as: FLOMAX Take 1 capsule (0.4 mg total) by mouth daily.   VITAMIN D PO Take 5,000 Units by mouth daily.        Allergies:  Allergies  Allergen Reactions   Bee Pollen Anaphylaxis   Broccoli [Brassica Oleracea] Anaphylaxis and Hives   Fish-Derived Products Anaphylaxis and Hives   Mushroom Extract Complex Anaphylaxis   Penicillins Anaphylaxis    **Tolerated cefepime March 2021 Did it  involve swelling of the face/tongue/throat, SOB, or low BP? Yes Did it involve sudden or severe rash/hives, skin peeling, or any reaction on the inside of your mouth or nose? No Did you need to seek medical attention at a hospital or doctor's office? Yes When did it last happen?  A few months ago If all above answers are "NO", may proceed with cephalosporin use.   Rosemary Oil Anaphylaxis   Shellfish Allergy Anaphylaxis and Hives   Tomato Anaphylaxis and Hives    Raw tomatoes only, can tolerate if cooked   Acetaminophen Other (See Comments)    GI upset   Aloe Vera Hives   Doxycycline Diarrhea   Acyclovir And Related Other (See Comments)    Unknown reaction   Naproxen Hives  Past Medical History, Surgical history, Social history, and Family History were reviewed and updated.  Review of Systems: All other 10 point review of systems is negative.   Physical Exam:  height is '4\' 11"'$  (1.499 m). Her oral temperature is 98.2 F (36.8 C). Her blood pressure is 196/71 (abnormal) and her pulse is 70. Her respiration is 18 and oxygen saturation is 100%.   Wt Readings from Last 3 Encounters:  07/08/22 194 lb 14.2 oz (88.4 kg)  03/25/22 160 lb (72.6 kg)  02/14/22 168 lb (76.2 kg)    Ocular: Sclerae unicteric, pupils equal, round and reactive to light Ear-nose-throat: Oropharynx clear, dentition fair Lymphatic: No cervical or supraclavicular adenopathy Lungs no rales or rhonchi, good excursion bilaterally Heart regular rate and rhythm, no murmur appreciated Abd soft, nontender, positive bowel sounds MSK no focal spinal tenderness, no joint edema Neuro: non-focal, well-oriented, appropriate affect Breasts: Deferred   Lab Results  Component Value Date   WBC 5.8 11/08/2022   HGB 9.9 (L) 11/08/2022   HCT 32.0 (L) 11/08/2022   MCV 91.2 11/08/2022   PLT 160 11/08/2022   Lab Results  Component Value Date   FERRITIN 401 (H) 09/06/2022   IRON 69 09/06/2022   TIBC 258 09/06/2022    UIBC 189 09/06/2022   IRONPCTSAT 27 09/06/2022   Lab Results  Component Value Date   RETICCTPCT 2.1 11/08/2022   RBC 3.51 (L) 11/08/2022   RBC 3.50 (L) 11/08/2022   No results found for: "KPAFRELGTCHN", "LAMBDASER", "KAPLAMBRATIO" No results found for: "IGGSERUM", "IGA", "IGMSERUM" No results found for: "TOTALPROTELP", "ALBUMINELP", "A1GS", "A2GS", "BETS", "BETA2SER", "GAMS", "MSPIKE", "SPEI"   Chemistry      Component Value Date/Time   NA 136 11/08/2022 1243   K 5.6 (H) 11/08/2022 1243   CL 108 11/08/2022 1243   CO2 19 (L) 11/08/2022 1243   BUN 71 (H) 11/08/2022 1243   CREATININE 3.92 (HH) 11/08/2022 1243   CREATININE 1.97 (H) 06/24/2020 1431      Component Value Date/Time   CALCIUM 9.2 11/08/2022 1243   ALKPHOS 152 (H) 11/08/2022 1243   AST 15 11/08/2022 1243   ALT 10 11/08/2022 1243   BILITOT 0.5 11/08/2022 1243       Impression and Plan: Ms. Dhaliwal is a very pleasant 67 yo caucasian female with long standing history of iron deficiency anemia as well as history of DVT's in both the right and left peroneal and posterior tibial veins while she was bed bound in the hospital being treated for osteomyelitis.  She had an elevated homocystine level, mildly elevated anticardiolipin antibody IgG level and elevated D-dimer as well with lab work.  ESA held today due to HTN.  We will recheck in a week.  Iron studies pending.  Lab check and injection every 3 weeks.  Follow-up in 9 weeks.   Lottie Dawson, NP 1/10/20241:23 PM

## 2022-11-08 NOTE — Progress Notes (Signed)
Critical lab result received from lab of creatinine 3.92 Lottie Dawson NP aware and no new orders received.

## 2022-11-09 ENCOUNTER — Other Ambulatory Visit: Payer: Self-pay | Admitting: Family Medicine

## 2022-11-09 ENCOUNTER — Encounter: Admit: 2022-11-09 | Discharge: 2022-11-09 | Payer: MEDICARE

## 2022-11-09 LAB — IRON AND IRON BINDING CAPACITY (CC-WL,HP ONLY)
Iron: 53 ug/dL (ref 28–170)
Saturation Ratios: 21 % (ref 10.4–31.8)
TIBC: 256 ug/dL (ref 250–450)
UIBC: 203 ug/dL (ref 148–442)

## 2022-11-09 MED ORDER — PREGABALIN 75 MG PO CAPS: 75.0000 mg | ORAL_CAPSULE | Freq: Three times a day (TID) | ORAL | 1 refills | Status: DC

## 2022-11-09 NOTE — Telephone Encounter
-----  Message from Julious Payer, MD sent at 11/08/2022  8:09 AM CST -----  Regarding: [place],    [place] call the patient to complete pre-visit labs before their upcoming appointment with me. Please tell her to submit a urine sample as well as blood sample.  Thank you.    Julious Payer, MD

## 2022-11-10 ENCOUNTER — Ambulatory Visit: Payer: PRIVATE HEALTH INSURANCE

## 2022-11-14 ENCOUNTER — Other Ambulatory Visit: Payer: Self-pay | Admitting: Family Medicine

## 2022-11-14 ENCOUNTER — Encounter: Admit: 2022-11-14 | Discharge: 2022-11-14 | Payer: MEDICARE

## 2022-11-14 DIAGNOSIS — E1165 Type 2 diabetes mellitus with hyperglycemia: Secondary | ICD-10-CM

## 2022-11-14 DIAGNOSIS — F418 Other specified anxiety disorders: Secondary | ICD-10-CM

## 2022-11-14 MED ORDER — DIAZEPAM 5 MG PO TABS: 5.0000 mg | ORAL_TABLET | Freq: Two times a day (BID) | ORAL | 0 refills | Status: DC | PRN

## 2022-11-14 MED ORDER — LANTUS SOLOSTAR 100 UNIT/ML ~~LOC~~ SOPN: PEN_INJECTOR | SUBCUTANEOUS | 3 refills | Status: DC

## 2022-11-14 NOTE — Telephone Encounter (Signed)
Medication: pt is requesting rx given at the hospital insulin glargine (LANTUS SOLOSTAR) 100 UNIT/ML Solostar Pen  diazepam (VALIUM) 5 MG tablet  Has the patient contacted their pharmacy? No.   Preferred Pharmacy: [facility], [place] [address] [place] [address] Phone: [phone]  Fax: [phone]

## 2022-11-14 NOTE — Telephone Encounter (Signed)
Requesting: Valium Contract: 02/14/2022 UDS: Last OV:  09/07/2022 Next OV: N/A Last Refill: 05/25/2022, #[id number]--0 RF Database:   Please advise
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
## 2022-11-15 ENCOUNTER — Ambulatory Visit (INDEPENDENT_AMBULATORY_CARE_PROVIDER_SITE_OTHER): Payer: PRIVATE HEALTH INSURANCE

## 2022-11-15 ENCOUNTER — Inpatient Hospital Stay: Payer: PRIVATE HEALTH INSURANCE

## 2022-11-15 ENCOUNTER — Encounter: Admit: 2022-11-15 | Discharge: 2022-11-15 | Payer: MEDICARE

## 2022-11-15 VITALS — BP 158/55 | HR 65 | Temp 98.1°F | Resp 18

## 2022-11-15 DIAGNOSIS — N1832 Chronic kidney disease, stage 3b: Secondary | ICD-10-CM

## 2022-11-15 DIAGNOSIS — D631 Anemia in chronic kidney disease: Secondary | ICD-10-CM

## 2022-11-15 DIAGNOSIS — N183 Chronic kidney disease, stage 3 unspecified: Secondary | ICD-10-CM | POA: Diagnosis not present

## 2022-11-15 DIAGNOSIS — D508 Other iron deficiency anemias: Secondary | ICD-10-CM

## 2022-11-15 DIAGNOSIS — M810 Age-related osteoporosis without current pathological fracture: Secondary | ICD-10-CM

## 2022-11-15 DIAGNOSIS — D500 Iron deficiency anemia secondary to blood loss (chronic): Secondary | ICD-10-CM

## 2022-11-15 LAB — CMP (CANCER CENTER ONLY)
ALT: 6 U/L (ref 0–44)
AST: 10 U/L — ABNORMAL LOW (ref 15–41)
Albumin: 3.7 g/dL (ref 3.5–5.0)
Alkaline Phosphatase: 111 U/L (ref 38–126)
Anion gap: 8 (ref 5–15)
BUN: 64 mg/dL — ABNORMAL HIGH (ref 8–23)
CO2: 21 mmol/L — ABNORMAL LOW (ref 22–32)
Calcium: 8.3 mg/dL — ABNORMAL LOW (ref 8.9–10.3)
Chloride: 109 mmol/L (ref 98–111)
Creatinine: 4.23 mg/dL (ref 0.44–1.00)
GFR, Estimated: 11 mL/min — ABNORMAL LOW (ref >60)
Glucose, Bld: 90 mg/dL (ref 70–99)
Potassium: 5 mmol/L (ref 3.5–5.1)
Sodium: 138 mmol/L (ref 135–145)
Total Bilirubin: 0.4 mg/dL (ref 0.3–1.2)
Total Protein: 8.2 g/dL — ABNORMAL HIGH (ref 6.5–8.1)

## 2022-11-15 LAB — CBC WITH DIFFERENTIAL (CANCER CENTER ONLY)
Abs Immature Granulocytes: 0.04 10*3/uL (ref 0.00–0.07)
Basophils Absolute: 0 10*3/uL (ref 0.0–0.1)
Basophils Relative: 1 %
Eosinophils Absolute: 0.2 10*3/uL (ref 0.0–0.5)
Eosinophils Relative: 3 %
HCT: 29.8 % — ABNORMAL LOW (ref 36.0–46.0)
Hemoglobin: 9.1 g/dL — ABNORMAL LOW (ref 12.0–15.0)
Immature Granulocytes: 1 %
Lymphocytes Relative: 14 %
Lymphs Abs: 1 10*3/uL (ref 0.7–4.0)
MCH: 28.3 pg (ref 26.0–34.0)
MCHC: 30.5 g/dL (ref 30.0–36.0)
MCV: 92.8 fL (ref 80.0–100.0)
Monocytes Absolute: 0.6 10*3/uL (ref 0.1–1.0)
Monocytes Relative: 8 %
Neutro Abs: 5.5 10*3/uL (ref 1.7–7.7)
Neutrophils Relative %: 73 %
Platelet Count: 148 10*3/uL — ABNORMAL LOW (ref 150–400)
RBC: 3.21 MIL/uL — ABNORMAL LOW (ref 3.87–5.11)
RDW: 16 % — ABNORMAL HIGH (ref 11.5–15.5)
WBC Count: 7.3 10*3/uL (ref 4.0–10.5)
nRBC: 0 % (ref 0.0–0.2)

## 2022-11-15 LAB — SAMPLE TO BLOOD BANK

## 2022-11-15 MED ORDER — EPOETIN ALFA-EPBX 40000 UNIT/ML IJ SOLN
40000.0000 [IU] | Freq: Once | INTRAMUSCULAR | Status: AC
  Administered 2022-11-15: 40000 [IU] via SUBCUTANEOUS
  Filled 2022-11-15: qty 1

## 2022-11-15 MED ORDER — ROMOSOZUMAB-AQQG 105 MG/1.17ML ~~LOC~~ SOSY
210.0000 mg | PREFILLED_SYRINGE | Freq: Once | SUBCUTANEOUS | Status: AC
  Administered 2022-11-15: 210 mg via SUBCUTANEOUS

## 2022-11-15 NOTE — Progress Notes (Signed)
Patient is here today for a Evenity Injection. Injection was given subcutaneously on L arm. Pt tolerated well. Patients next injection is scheduled for 12/19/2022

## 2022-11-15 NOTE — Patient Instructions (Signed)
Epoetin Alfa Injection What is this medication? EPOETIN ALFA (e POE e tin AL fa) treats low levels of red blood cells (anemia) caused by kidney disease, chemotherapy, or HIV medications. It can also be used in people who are at risk for blood loss during surgery. It works by helping your body make more red blood cells, which reduces the need for blood transfusions. This medicine may be used for other purposes; ask your health care provider or pharmacist if you have questions. COMMON BRAND NAME(S): Epogen, Procrit, Retacrit What should I tell my care team before I take this medication? They need to know if you have any of these conditions: Blood clots Cancer Heart disease High blood pressure On dialysis Seizures Stroke An unusual or allergic reaction to epoetin alfa, albumin, benzyl alcohol, other medications, foods, dyes, or preservatives Pregnant or trying to get pregnant Breast-feeding How should I use this medication? This medication is injected into a vein or under the skin. It is usually given by your care team in a hospital or clinic setting. It may also be given at home. If you get this medication at home, you will be taught how to prepare and give it. Use exactly as directed. Take it as directed on the prescription label at the same time every day. Keep taking it unless your care team tells you to stop. It is important that you put your used needles and syringes in a special sharps container. Do not put them in a trash can. If you do not have a sharps container, call your pharmacist or care team to get one. A special MedGuide will be given to you by the pharmacist with each prescription and refill. Be sure to read this information carefully each time. Talk to your care team about the use of this medication in children. While this medication may be used in children as young as 1 month of age for selected conditions, precautions do apply. Overdosage: If you think you have taken too much  of this medicine contact a poison control center or emergency room at once. NOTE: This medicine is only for you. Do not share this medicine with others. What if I miss a dose? If you miss a dose, take it as soon as you can. If it is almost time for your next dose, take only that dose. Do not take double or extra doses. What may interact with this medication? Darbepoetin alfa Methoxy polyethylene glycol-epoetin beta This list may not describe all possible interactions. Give your health care provider a list of all the medicines, herbs, non-prescription drugs, or dietary supplements you use. Also tell them if you smoke, drink alcohol, or use illegal drugs. Some items may interact with your medicine. What should I watch for while using this medication? Visit your care team for regular checks on your progress. Check your blood pressure as directed. Know what your blood pressure should be and when to contact your care team. Your condition will be monitored carefully while you are receiving this medication. You may need blood work while taking this medication. What side effects may I notice from receiving this medication? Side effects that you should report to your care team as soon as possible: Allergic reactions--skin rash, itching, hives, swelling of the face, lips, tongue, or throat Blood clot--pain, swelling, or warmth in the leg, shortness of breath, chest pain Heart attack--pain or tightness in the chest, shoulders, arms, or jaw, nausea, shortness of breath, cold or clammy skin, feeling faint or lightheaded Increase   in blood pressure Rash, fever, and swollen lymph nodes Redness, blistering, peeling, or loosening of the skin, including inside the mouth Seizures Stroke--sudden numbness or weakness of the face, arm, or leg, trouble speaking, confusion, trouble walking, loss of balance or coordination, dizziness, severe headache, change in vision Side effects that usually do not require medical  attention (report to your care team if they continue or are bothersome): Bone, joint, or muscle pain Cough Headache Nausea Pain, redness, or irritation at injection site This list may not describe all possible side effects. Call your doctor for medical advice about side effects. You may report side effects to FDA at 1-800-FDA-1088. Where should I keep my medication? Keep out of the reach of children and pets. Store in a refrigerator. Do not freeze. Do not shake. Protect from light. Keep this medication in the original container until you are ready to take it. See product for storage information. Get rid of any unused medication after the expiration date. To get rid of medications that are no longer needed or have expired: Take the medication to a medication take-back program. Check with your pharmacy or law enforcement to find a location. If you cannot return the medication, ask your pharmacist or care team how to get rid of the medication safely. NOTE: This sheet is a summary. It may not cover all possible information. If you have questions about this medicine, talk to your doctor, pharmacist, or health care provider.  2023 Elsevier/Gold Standard (2022-01-24 00:00:00)

## 2022-11-15 NOTE — Progress Notes (Signed)
Critical lab result received from lab of creatinine 4.23 Dr. Marin Olp aware and no orders received.

## 2022-11-16 ENCOUNTER — Ambulatory Visit: Admit: 2022-11-16 | Discharge: 2022-11-17 | Payer: MEDICARE

## 2022-11-16 ENCOUNTER — Encounter: Admit: 2022-11-16 | Discharge: 2022-11-16 | Payer: MEDICARE

## 2022-11-16 NOTE — Progress Notes
A follow-up comprehensive medication management visit was completed today via telephone.    Referral reason: Hypertension Management and [facility]  Referring provider: Dr. Sherlyn Hay, MD    [facility] enrollment date: 10/02/2022  [facility] study end date: 10/02/2024  Study coordinator: Rhonda Fletcher    Assessment & Plan     Hypertension  Patient's blood pressure is uncontrolled per clinic readings and Qardio report. They have had a microalbumin completed. They have normal-mild albuminuria present. Their goal blood pressure is < 130/80 mmHg based on 2018 ACC/AHA guidelines.    Qardio Home Blood Pressure Readings:  7 day average: 147/88 mmHg, HR: 65  1 month average: 141/85 mmHg, HR: 70  Today, we discussed blood pressure technique and [name] reports ensuring she is resting prior to taking blood pressure. Patient does note her neighbor contributes to a higher stress level. BP readings for the past several days have been close to goal. Discussed diet and patient notes she occasionally has fried chicken. We discussed limiting foods like fried chicken that tend to have a higher sodium amount.    Plan  START:  Checking blood pressure in the morning daily and in the afternoon 2-3 times per week  Limiting foods high in salt such as fried chicken (has about 500 mg of sodium per 3 oz serving)  -Monitor BP if having fried chicken     CONTINUE:  Irbesartan to 300mg  daily at bedtime  Amlodipine 10mg  daily in the morning   Carvedilol 25mg  po twice daily with breakfast and dinner   Ensure resting 5-10 minutes before blood pressure reading     Future considerations:  Patient does report she has clonidine patches at home        Follow-up  The patient will continue to follow up with the pharmacist. Return to pharmacist in 2 weeks via telephone. The return visit was scheduled during today's visit.    Subjective & Objective   At the last visit, the patient was instructed to rest at least 5-10 minutes prior to blood pressure reading. The patient states they made all of the recommended changes.   Rhonda Fletcher was referred to the pharmacy team for hypertension management. She does report dizziness all the time.    Patient reports she did try a couple clonidine patches for a couple days and they made her feel sluggish so she has not been utilizing them for blood pressure control. She does not recall what her blood pressures were reading while utilizing the clonidine patches.     Hypertension    HPI  Current hypertension medication regimen:   Irbesartan to 300mg  daily (maximum dose)  Amlodipine 10mg  daily (maximum dose)  Carvedilol 25mg  po twice daily (maximum dose)  Previous medications for hypertension: Hctz (nausea/vomiting), spironolactone (nausea/vomiting), hydralazine (burned stomach), clonidine (felt sluggish)    The baseline blood pressure was 143/75 on 08/31/2022.    Lifestyle  Physical activity/exercise: yes. Tries to go to the gym. Stopped going when she got sick. Wants to go to the gym 3-4 times per week in the new year.  Adds salt to food: no. sometimes add seasoning salt  Caffeine consumption: yes. 1-2 cups of coffee each morning  Elevated stress: yes. neighbor does cause stress  Alcohol use: yes. 1 glass of wine every once in a while  Tobacco use: no    Monitoring  Patient instructed to check blood pressure 2 time(s) per day.  Cuff type: Qardio  Appropriate technique: yes    Symptom  Review  Hypotension: no  Chest pain: no  Shortness of breath: no  Lower extremity edema: no. High blood pressure symptoms include lightheadedness, headache, change in vision    Comorbidities  ASCVD condition(s): none  CKD: yes  Diabetes: yes (type 2 and uncontrolled)  Dyslipidemia: yes    Health Maintenance  Aspirin utilization: Patient is not taking aspirin due to the following reason(s): not indicated.  Statin utilization: Patient is not taking a statin.    Qardio Blood Pressure Readings:          Primary Care - Labs  BP Readings from Last 3 Encounters:   08/31/22 (!) 143/75   07/21/22 (P) 131/65   07/19/22 (!) 144/62     Pulse Readings from Last 3 Encounters:   08/31/22 63   07/21/22 69   07/19/22 95     Comprehensive Metabolic Profile    Lab Results   Component Value Date/Time    NA 137 08/16/2022 09:50 AM    K 4.4 08/16/2022 09:50 AM    CL 105 08/16/2022 09:50 AM    CO2 21 08/16/2022 09:50 AM    GAP 11 08/16/2022 09:50 AM    BUN 33 (H) 08/16/2022 09:50 AM    CR 1.88 (H) 08/16/2022 09:50 AM    GLU 276 (H) 08/16/2022 09:50 AM    GLU 146 (H) 02/21/2018 09:45 AM    Lab Results   Component Value Date/Time    CA 9.4 08/16/2022 09:50 AM    PO4 3.1 07/25/2022 12:07 PM    ALBUMIN 4.1 07/25/2022 12:07 PM    TOTPROT 7.4 07/25/2022 12:07 PM    ALKPHOS 82 07/25/2022 12:07 PM    AST 13 07/25/2022 12:07 PM    ALT 12 07/25/2022 12:07 PM    TOTBILI 0.5 07/25/2022 12:07 PM    GFR 33 (L) 08/24/2020 09:30 AM    GFRAA 40 (L) 08/24/2020 09:30 AM        eGFR   Date Value Ref Range Status   08/16/2022 29 (L) >60 mL/min Final     Comment:     eGFR calculated using the CKD-EPIcr_R equation     Lab Results   Component Value Date/Time    URMALBCRRAT 54.62 (H) 02/28/2022 10:39 AM     BMI Readings from Last 1 Encounters:   08/31/22 29.26 kg/m?     The 10-year ASCVD risk score (Arnett DK, et al., 2019) is: 28.1%    Values used to calculate the score:      Age: 67 years      Sex: Female      Is Non-Hispanic African American: Yes      Diabetic: Yes      Tobacco smoker: No      Systolic Blood Pressure: 143 mmHg      Is BP treated: Yes      HDL Cholesterol: 97 MG/DL      Total Cholesterol: 232 MG/DL     Medication History  Medication history was not completed because previously completed (follow-up visit).    Adverse Drug Reactions  Adverse drug reactions were reviewed with the patient.    Significant adverse drug reaction(s) were not identified.    Side effect(s) were not reported.    Refills needed: no    Home Medications    Medication Sig   ACCU-CHEK AVIVA PLUS TEST STRP test strip TEST EVERY DAY BEFORE BREAKFAST AS DIRECTED   ACCU-CHEK GUIDE GLUCOSE METER kit USE AS DIRECTED TO CHECK BLOOD SUGARS  albuterol sulfate (PROAIR HFA) 90 mcg/actuation HFA aerosol inhaler Inhale two puffs by mouth into the lungs every 6 hours as needed.   amLODIPine (NORVASC) 10 mg tablet TAKE 1 TABLET EVERY DAY   azelastine (ASTELIN) 137 mcg (0.1 %) nasal spray Apply two sprays to each nostril as directed twice daily.   betamethasone valerate (VALISONE) 0.1 % topical cream APPLY TOPICALLY TO AFFECTED AREA DAILY   Blood Glucose Control, Normal soln Use in glucose meter, Accu-check Aviva   budesonide-formoterol HFA (SYMBICORT) 160-4.5 mcg/actuation aerosol inhaler Inhale two puffs by mouth into the lungs twice daily.   carvediloL (COREG) 25 mg tablet Take one tablet by mouth twice daily with meals. Take with food.   cetirizine (ZYRTEC) 10 mg tablet Take one tablet by mouth daily.     cholecalciferol(+) (VITAMIN D-3) 2,000 unit tablet Take 1 Tab by mouth daily.  Patient taking differently: Take one tablet by mouth daily. 5,000 units daily   cloNIDine (CATAPRES-TTS-1) 0.1 mg/day patch Apply one patch to top of skin as directed every 7 days.   cyanocobalamin (vitamin B-12) 5,000 mcg cap Take 1 tablet by mouth daily.   cyclobenzaprine (FLEXERIL) 5 mg tablet TAKE 1 TABLET AT BEDTIME   DROPSAFE ALCOHOL PREP PADS USE TO CLEAN SKIN BEFORE FINGERSTICK   ezetimibe (ZETIA) 10 mg tablet Take one tablet by mouth daily.   famotidine (PEPCID) 20 mg tablet TAKE 1 TABLET TWICE DAILY   ferrous sulfate (FEOSOL) 325 mg (65 mg iron) tablet Take one tablet by mouth every 48 hours.   fluticasone propionate (FLONASE) 50 mcg/actuation nasal spray, suspension USE 2 SPRAYS IN EACH NOSTRIL AS DIRECTED DAILY. SHAKE BOTTLE GENTLY BEFORE USING   glimepiride (AMARYL) 4 mg tablet Take one tablet by mouth every morning.   irbesartan (AVAPRO) 300 mg tablet Take one tablet by mouth at bedtime daily.   lancets MISC Use one each as directed daily before breakfast. Diag: E11.29   mirtazapine (REMERON) 15 mg tablet TAKE ONE TABLET BY MOUTH AT BEDTIME DAILY.   pantoprazole DR (PROTONIX) 40 mg tablet Take one tablet by mouth daily.   pioglitazone (ACTOS) 45 mg tablet Take one tablet by mouth daily.      Education  Education provided: yes    Hypertension  Education components: BP goal and disease risk factors, low salt diet encouraged, correct BP monitoring technique and encouraged patient to monitor BP at home    Time spent with patient: 25 minutes    Thank you for allowing me to participate in the care of your patient,  Rhonda Fletcher, [facility]  [facility] Primary Care Clinical Pharmacist

## 2022-11-20 NOTE — Telephone Encounter (Signed)
Last Evenity inj: 11/15/22 Next Evenity inj DUE: 12/15/22

## 2022-11-23 ENCOUNTER — Encounter (HOSPITAL_BASED_OUTPATIENT_CLINIC_OR_DEPARTMENT_OTHER): Payer: PRIVATE HEALTH INSURANCE | Admitting: General Surgery

## 2022-11-23 ENCOUNTER — Encounter (HOSPITAL_BASED_OUTPATIENT_CLINIC_OR_DEPARTMENT_OTHER): Payer: PRIVATE HEALTH INSURANCE | Attending: General Surgery | Admitting: General Surgery

## 2022-11-30 ENCOUNTER — Ambulatory Visit (INDEPENDENT_AMBULATORY_CARE_PROVIDER_SITE_OTHER): Payer: PRIVATE HEALTH INSURANCE | Admitting: Family Medicine

## 2022-11-30 ENCOUNTER — Encounter: Payer: Self-pay | Admitting: Family Medicine

## 2022-11-30 ENCOUNTER — Ambulatory Visit: Admit: 2022-11-30 | Discharge: 2022-12-01 | Payer: MEDICARE

## 2022-11-30 ENCOUNTER — Encounter: Admit: 2022-11-30 | Discharge: 2022-11-30 | Payer: MEDICARE

## 2022-11-30 VITALS — BP 156/57 | HR 68 | Temp 98.1°F | Resp 16

## 2022-11-30 DIAGNOSIS — M8610 Other acute osteomyelitis, unspecified site: Secondary | ICD-10-CM | POA: Diagnosis not present

## 2022-11-30 DIAGNOSIS — Z981 Arthrodesis status: Secondary | ICD-10-CM

## 2022-11-30 DIAGNOSIS — G894 Chronic pain syndrome: Secondary | ICD-10-CM

## 2022-11-30 DIAGNOSIS — J0140 Acute pansinusitis, unspecified: Secondary | ICD-10-CM

## 2022-11-30 MED ORDER — AZITHROMYCIN 250 MG PO TABS: ORAL_TABLET | ORAL | 0 refills | Status: AC

## 2022-11-30 MED ORDER — HYDROCODONE-ACETAMINOPHEN 5-325 MG PO TABS: 1.0000 | ORAL_TABLET | Freq: Four times a day (QID) | ORAL | 0 refills | Status: DC | PRN

## 2022-11-30 NOTE — Progress Notes (Signed)
   Acute Office Visit  Subjective:     [name], adult    DOB: May 03, 1956, 67 y.o.   MRN: [id number]  Chief Complaint  Patient presents with   Nasal Congestion    HPI   Sinus Pain: Patient complains of congestion, facial pain, low grade fever, nasal congestion, purulent nasal discharge, sinus pressure, sore throat, and epistaxis at times . Onset of symptoms was 2 weeks ago, gradually worsening since that time. She is drinking plenty of fluids.   She has been trying OTC cough/cold meds.   Also requesting her next Norco refill since PCP is out of the office.   ROS All review of systems negative except what is listed in the HPI      Objective:    BP (!) 156/57   Pulse 68   Temp 98.1 F (36.7 C)   Resp 16   SpO2 100%    Physical Exam Vitals reviewed.  Constitutional:      Appearance: Normal appearance.  HENT:     Head: Normocephalic and atraumatic.     Comments: Tenderness to frontal/maxillary sinus percussion bilaterally    Nose: Congestion present.     Mouth/Throat:     Mouth: Mucous membranes are moist.     Pharynx: Oropharynx is clear. No oropharyngeal exudate or posterior oropharyngeal erythema.  Cardiovascular:     Rate and Rhythm: Normal rate and regular rhythm.     Pulses: Normal pulses.     Heart sounds: Normal heart sounds.  Pulmonary:     Effort: Pulmonary effort is normal.     Breath sounds: Normal breath sounds.  Skin:    General: Skin is warm and dry.  Neurological:     Mental Status: She is alert and oriented to person, place, and time.  Psychiatric:        Mood and Affect: Mood normal.        Behavior: Behavior normal.        Thought Content: Thought content normal.        Judgment: Judgment normal.       No results found for any visits on 11/30/22.      Assessment & Plan:    1. Chronic pain syndrome 2. S/P spinal fusion 3. Other acute osteomyelitis, unspecified site ([place]) Refilling one month supply of norco  for chronic pain since PCP is not available. PDMP reviewed and script post-dated for 12/05/22 fill. - HYDROcodone-acetaminophen (NORCO/VICODIN) 5-325 MG tablet; Take 1 tablet by mouth every 6 (six) hours as needed for moderate pain.  Dispense: 120 tablet; Refill: 0  4. Acute non-recurrent pansinusitis Given duration, start Zpak (multiple ABX allergies). Continue supportive measures including rest, hydration, humidifier use, steam showers, warm compresses to sinuses, warm liquids with lemon and honey, and over-the-counter cough, cold, and analgesics as needed.  Patient aware of signs/symptoms requiring further/urgent evaluation.  - azithromycin (ZITHROMAX) 250 MG tablet; Take 2 tablets on day 1, then 1 tablet daily on days 2 through 5  Dispense: 6 tablet; Refill: 0    Return if symptoms worsen or fail to improve.  Terrilyn Saver, NP

## 2022-11-30 NOTE — Patient Instructions (Signed)
Adding Zpak Continue supportive measures including rest, hydration, humidifier use, steam showers, warm compresses to sinuses, warm liquids with lemon and honey, and over-the-counter cough, cold, and analgesics as needed.

## 2022-12-06 ENCOUNTER — Inpatient Hospital Stay: Payer: PRIVATE HEALTH INSURANCE

## 2022-12-06 ENCOUNTER — Inpatient Hospital Stay: Payer: PRIVATE HEALTH INSURANCE | Attending: Hematology & Oncology

## 2022-12-06 ENCOUNTER — Telehealth: Payer: Self-pay

## 2022-12-06 VITALS — BP 152/55 | HR 65 | Temp 98.2°F | Resp 16

## 2022-12-06 DIAGNOSIS — D631 Anemia in chronic kidney disease: Secondary | ICD-10-CM | POA: Diagnosis present

## 2022-12-06 DIAGNOSIS — I129 Hypertensive chronic kidney disease with stage 1 through stage 4 chronic kidney disease, or unspecified chronic kidney disease: Secondary | ICD-10-CM | POA: Diagnosis present

## 2022-12-06 DIAGNOSIS — D500 Iron deficiency anemia secondary to blood loss (chronic): Secondary | ICD-10-CM

## 2022-12-06 DIAGNOSIS — D649 Anemia, unspecified: Secondary | ICD-10-CM

## 2022-12-06 DIAGNOSIS — N183 Chronic kidney disease, stage 3 unspecified: Secondary | ICD-10-CM | POA: Insufficient documentation

## 2022-12-06 DIAGNOSIS — D508 Other iron deficiency anemias: Secondary | ICD-10-CM

## 2022-12-06 LAB — CBC WITH DIFFERENTIAL (CANCER CENTER ONLY)
Abs Immature Granulocytes: 0.01 10*3/uL (ref 0.00–0.07)
Basophils Absolute: 0.1 10*3/uL (ref 0.0–0.1)
Basophils Relative: 1 %
Eosinophils Absolute: 0.4 10*3/uL (ref 0.0–0.5)
Eosinophils Relative: 6 %
HCT: 29.9 % — ABNORMAL LOW (ref 36.0–46.0)
Hemoglobin: 8.9 g/dL — ABNORMAL LOW (ref 12.0–15.0)
Immature Granulocytes: 0 %
Lymphocytes Relative: 15 %
Lymphs Abs: 1 10*3/uL (ref 0.7–4.0)
MCH: 28.4 pg (ref 26.0–34.0)
MCHC: 29.8 g/dL — ABNORMAL LOW (ref 30.0–36.0)
MCV: 95.5 fL (ref 80.0–100.0)
Monocytes Absolute: 0.6 10*3/uL (ref 0.1–1.0)
Monocytes Relative: 9 %
Neutro Abs: 4.5 10*3/uL (ref 1.7–7.7)
Neutrophils Relative %: 69 %
Platelet Count: 146 10*3/uL — ABNORMAL LOW (ref 150–400)
RBC: 3.13 MIL/uL — ABNORMAL LOW (ref 3.87–5.11)
RDW: 15.9 % — ABNORMAL HIGH (ref 11.5–15.5)
WBC Count: 6.5 10*3/uL (ref 4.0–10.5)
nRBC: 0 % (ref 0.0–0.2)

## 2022-12-06 LAB — CMP (CANCER CENTER ONLY)
ALT: 6 U/L (ref 0–44)
AST: 11 U/L — ABNORMAL LOW (ref 15–41)
Albumin: 3.6 g/dL (ref 3.5–5.0)
Alkaline Phosphatase: 143 U/L — ABNORMAL HIGH (ref 38–126)
Anion gap: 10 (ref 5–15)
BUN: 74 mg/dL — ABNORMAL HIGH (ref 8–23)
CO2: 17 mmol/L — ABNORMAL LOW (ref 22–32)
Calcium: 7.7 mg/dL — ABNORMAL LOW (ref 8.9–10.3)
Chloride: 109 mmol/L (ref 98–111)
Creatinine: 4.71 mg/dL (ref 0.44–1.00)
GFR, Estimated: 10 mL/min — ABNORMAL LOW (ref >60)
Glucose, Bld: 92 mg/dL (ref 70–99)
Potassium: 5.9 mmol/L — ABNORMAL HIGH (ref 3.5–5.1)
Sodium: 136 mmol/L (ref 135–145)
Total Bilirubin: 0.4 mg/dL (ref 0.3–1.2)
Total Protein: 8 g/dL (ref 6.5–8.1)

## 2022-12-06 LAB — RETICULOCYTES
Immature Retic Fract: 5.5 % (ref 2.3–15.9)
RBC.: 3.09 MIL/uL — ABNORMAL LOW (ref 3.87–5.11)
Retic Count, Absolute: 66.1 10*3/uL (ref 19.0–186.0)
Retic Ct Pct: 2.1 % (ref 0.4–3.1)

## 2022-12-06 LAB — SAMPLE TO BLOOD BANK

## 2022-12-06 LAB — FERRITIN: Ferritin: 209 ng/mL (ref 11–307)

## 2022-12-06 MED ORDER — EPOETIN ALFA-EPBX 40000 UNIT/ML IJ SOLN
40000.0000 [IU] | Freq: Once | INTRAMUSCULAR | Status: AC
  Administered 2022-12-06: 40000 [IU] via SUBCUTANEOUS
  Filled 2022-12-06: qty 1

## 2022-12-06 NOTE — Patient Instructions (Signed)
Epoetin Alfa Injection What is this medication? EPOETIN ALFA (e POE e tin AL fa) treats low levels of red blood cells (anemia) caused by kidney disease, chemotherapy, or HIV medications. It can also be used in people who are at risk for blood loss during surgery. It works by helping your body make more red blood cells, which reduces the need for blood transfusions. This medicine may be used for other purposes; ask your health care provider or pharmacist if you have questions. COMMON BRAND NAME(S): Epogen, Procrit, Retacrit What should I tell my care team before I take this medication? They need to know if you have any of these conditions: Blood clots Cancer Heart disease High blood pressure On dialysis Seizures Stroke An unusual or allergic reaction to epoetin alfa, albumin, benzyl alcohol, other medications, foods, dyes, or preservatives Pregnant or trying to get pregnant Breast-feeding How should I use this medication? This medication is injected into a vein or under the skin. It is usually given by your care team in a hospital or clinic setting. It may also be given at home. If you get this medication at home, you will be taught how to prepare and give it. Use exactly as directed. Take it as directed on the prescription label at the same time every day. Keep taking it unless your care team tells you to stop. It is important that you put your used needles and syringes in a special sharps container. Do not put them in a trash can. If you do not have a sharps container, call your pharmacist or care team to get one. A special MedGuide will be given to you by the pharmacist with each prescription and refill. Be sure to read this information carefully each time. Talk to your care team about the use of this medication in children. While this medication may be used in children as young as 1 month of age for selected conditions, precautions do apply. Overdosage: If you think you have taken too much  of this medicine contact a poison control center or emergency room at once. NOTE: This medicine is only for you. Do not share this medicine with others. What if I miss a dose? If you miss a dose, take it as soon as you can. If it is almost time for your next dose, take only that dose. Do not take double or extra doses. What may interact with this medication? Darbepoetin alfa Methoxy polyethylene glycol-epoetin beta This list may not describe all possible interactions. Give your health care provider a list of all the medicines, herbs, non-prescription drugs, or dietary supplements you use. Also tell them if you smoke, drink alcohol, or use illegal drugs. Some items may interact with your medicine. What should I watch for while using this medication? Visit your care team for regular checks on your progress. Check your blood pressure as directed. Know what your blood pressure should be and when to contact your care team. Your condition will be monitored carefully while you are receiving this medication. You may need blood work while taking this medication. What side effects may I notice from receiving this medication? Side effects that you should report to your care team as soon as possible: Allergic reactions--skin rash, itching, hives, swelling of the face, lips, tongue, or throat Blood clot--pain, swelling, or warmth in the leg, shortness of breath, chest pain Heart attack--pain or tightness in the chest, shoulders, arms, or jaw, nausea, shortness of breath, cold or clammy skin, feeling faint or lightheaded Increase   in blood pressure Rash, fever, and swollen lymph nodes Redness, blistering, peeling, or loosening of the skin, including inside the mouth Seizures Stroke--sudden numbness or weakness of the face, arm, or leg, trouble speaking, confusion, trouble walking, loss of balance or coordination, dizziness, severe headache, change in vision Side effects that usually do not require medical  attention (report to your care team if they continue or are bothersome): Bone, joint, or muscle pain Cough Headache Nausea Pain, redness, or irritation at injection site This list may not describe all possible side effects. Call your doctor for medical advice about side effects. You may report side effects to FDA at 1-800-FDA-1088. Where should I keep my medication? Keep out of the reach of children and pets. Store in a refrigerator. Do not freeze. Do not shake. Protect from light. Keep this medication in the original container until you are ready to take it. See product for storage information. Get rid of any unused medication after the expiration date. To get rid of medications that are no longer needed or have expired: Take the medication to a medication take-back program. Check with your pharmacy or law enforcement to find a location. If you cannot return the medication, ask your pharmacist or care team how to get rid of the medication safely. NOTE: This sheet is a summary. It may not cover all possible information. If you have questions about this medicine, talk to your doctor, pharmacist, or health care provider.  2023 Elsevier/Gold Standard (2022-01-24 00:00:00)  

## 2022-12-06 NOTE — Telephone Encounter (Signed)
Critical result received from lab of creatinine 4.71 Lottie Dawson NP aware and no new orders received.

## 2022-12-07 LAB — IRON AND IRON BINDING CAPACITY (CC-WL,HP ONLY)
Iron: 46 ug/dL (ref 28–170)
Saturation Ratios: 17 % (ref 10.4–31.8)
TIBC: 269 ug/dL (ref 250–450)
UIBC: 223 ug/dL (ref 148–442)

## 2022-12-13 ENCOUNTER — Telehealth: Payer: Self-pay | Admitting: Family Medicine

## 2022-12-13 ENCOUNTER — Ambulatory Visit (INDEPENDENT_AMBULATORY_CARE_PROVIDER_SITE_OTHER): Payer: PRIVATE HEALTH INSURANCE | Admitting: Family Medicine

## 2022-12-13 ENCOUNTER — Ambulatory Visit (HOSPITAL_BASED_OUTPATIENT_CLINIC_OR_DEPARTMENT_OTHER)
Admission: RE | Admit: 2022-12-13 | Discharge: 2022-12-13 | Disposition: A | Discharge status: Home / Self Care | Payer: PRIVATE HEALTH INSURANCE | Source: Ambulatory Visit | Attending: Family Medicine | Admitting: Family Medicine

## 2022-12-13 ENCOUNTER — Other Ambulatory Visit (HOSPITAL_COMMUNITY): Payer: Self-pay

## 2022-12-13 ENCOUNTER — Encounter: Payer: Self-pay | Admitting: Family Medicine

## 2022-12-13 VITALS — BP 140/72 | HR 79 | Resp 16

## 2022-12-13 DIAGNOSIS — R051 Acute cough: Secondary | ICD-10-CM

## 2022-12-13 DIAGNOSIS — R0600 Dyspnea, unspecified: Secondary | ICD-10-CM

## 2022-12-13 DIAGNOSIS — J0141 Acute recurrent pansinusitis: Secondary | ICD-10-CM

## 2022-12-13 DIAGNOSIS — R7989 Other specified abnormal findings of blood chemistry: Secondary | ICD-10-CM | POA: Diagnosis not present

## 2022-12-13 MED ORDER — DOXYCYCLINE HYCLATE 100 MG PO TABS: 100.0000 mg | ORAL_TABLET | Freq: Two times a day (BID) | ORAL | 0 refills | Status: AC

## 2022-12-13 NOTE — Telephone Encounter (Signed)
Pt said [name] [name] was going to write down an OTC medication on her paper but she noticed that nothing was on there. Pt wants to know what to have her husband pick up at the pharmacy. She believes it was a diuretic.

## 2022-12-13 NOTE — Telephone Encounter (Signed)
Please call pt to advise

## 2022-12-13 NOTE — Patient Instructions (Addendum)
Blood work and Insurance account manager today. Let's try doxycycline. Take a probiotic a few hours apart from the morning dose.

## 2022-12-13 NOTE — Progress Notes (Unsigned)
Acute Office Visit  Subjective:     [name], adult    DOB: 1956-06-11, 67 y.o.   MRN: [id number]  Chief Complaint  Patient presents with   Sinusitis    HPI Patient is in today for cough, sinusitis.   Patient was seen on 11/30/22 for sinus infection and treated with Zpak. Reports she felt a little better after finishing, but symptoms have since worsened again and she feels like it is settling into her chest. She now reports heavy cough, right lung pain, worse posteriorly with breathing and coughing. Sinus pain, headaches, low grade fevers, fatigue. States breathing is at baseline. No chest pain, nausea, vomiting, diarrhea. She had a negative home COVID test today.     ROS All review of systems negative except what is listed in the HPI      Objective:    BP (!) 140/72   Pulse 79   Resp 16   SpO2 97%    Physical Exam Vitals reviewed.  Constitutional:      Appearance: Normal appearance.  HENT:     Head: Normocephalic and atraumatic.     Comments: Tenderness to frontal/maxillary sinus percussion bilaterally    Nose: Congestion present.     Mouth/Throat:     Mouth: Mucous membranes are moist.     Pharynx: Oropharynx is clear. No oropharyngeal exudate or posterior oropharyngeal erythema.  Cardiovascular:     Rate and Rhythm: Normal rate and regular rhythm.     Pulses: Normal pulses.     Heart sounds: Normal heart sounds.  Pulmonary:     Effort: Pulmonary effort is normal.     Breath sounds: Normal breath sounds.  Musculoskeletal:     Comments: Tender to palpation of right posterolateral ribs  Skin:    General: Skin is warm and dry.  Neurological:     Mental Status: She is alert and oriented to person, place, and time.  Psychiatric:        Mood and Affect: Mood normal.        Behavior: Behavior normal.        Thought Content: Thought content normal.        Judgment: Judgment normal.        No results found for any visits on  12/13/22.      Assessment & Plan:   Problem List Items Addressed This Visit   None Visit Diagnoses     Acute recurrent pansinusitis    -  Primary   Relevant Medications   doxycycline (VIBRA-TABS) 100 MG tablet   Other Relevant Orders   Basic Metabolic Panel (BMET)   Acute cough       Relevant Orders   D-Dimer, Quantitative   DG Chest 2 View   CBC w/Diff   Basic Metabolic Panel (BMET)   Dyspnea, unspecified type       Relevant Orders   D-Dimer, Quantitative   DG Chest 2 View   CBC w/Diff   Basic Metabolic Panel (BMET)      Given dyspnea and discomfort with breathing and coughing, will check d-dimer, though likely costochondritis given tenderness to palpation also.  Try doxycycline (listed as intolerance due to diarrhea, but states she is willing to try anything at this point). Advised she start a probiotic as well. Take ABX with food and water.  Continue supportive measures including rest, hydration, humidifier use, steam showers, warm compresses to sinuses, warm liquids with lemon and honey, and over-the-counter cough, cold, and analgesics  as needed.  Chest xray today      Meds ordered this encounter  Medications   doxycycline (VIBRA-TABS) 100 MG tablet    Sig: Take 1 tablet (100 mg total) by mouth 2 (two) times daily for 10 days.    Dispense:  20 tablet    Refill:  0    Order Specific Question:   Supervising Provider    Answer:   Penni Homans A [[id number]]    Return if symptoms worsen or fail to improve.  Terrilyn Saver, NP

## 2022-12-14 ENCOUNTER — Telehealth: Payer: Self-pay

## 2022-12-14 LAB — CBC WITH DIFFERENTIAL/PLATELET
Basophils Absolute: 0.1 10*3/uL (ref 0.0–0.1)
Basophils Relative: 1 % (ref 0.0–3.0)
Eosinophils Absolute: 0.3 10*3/uL (ref 0.0–0.7)
Eosinophils Relative: 4.9 % (ref 0.0–5.0)
HCT: 26.5 % — ABNORMAL LOW (ref 36.0–46.0)
Hemoglobin: 8.6 g/dL — ABNORMAL LOW (ref 12.0–15.0)
Lymphocytes Relative: 13 % (ref 12.0–46.0)
Lymphs Abs: 0.7 10*3/uL (ref 0.7–4.0)
MCHC: 32.3 g/dL (ref 30.0–36.0)
MCV: 90.3 fl (ref 78.0–100.0)
Monocytes Absolute: 0.5 10*3/uL (ref 0.1–1.0)
Monocytes Relative: 9.8 % (ref 3.0–12.0)
Neutro Abs: 4 10*3/uL (ref 1.4–7.7)
Neutrophils Relative %: 71.3 % (ref 43.0–77.0)
Platelets: 189 10*3/uL (ref 150.0–400.0)
RBC: 2.94 Mil/uL — ABNORMAL LOW (ref 3.87–5.11)
RDW: 16.3 % — ABNORMAL HIGH (ref 11.5–15.5)
WBC: 5.5 10*3/uL (ref 4.0–10.5)

## 2022-12-14 LAB — BASIC METABOLIC PANEL
BUN: 61 mg/dL — ABNORMAL HIGH (ref 6–23)
CO2: 17 mEq/L — ABNORMAL LOW (ref 19–32)
Calcium: 7.4 mg/dL — ABNORMAL LOW (ref 8.4–10.5)
Chloride: 112 mEq/L (ref 96–112)
Creatinine, Ser: 4.5 mg/dL (ref 0.40–1.20)
GFR: 9.63 mL/min — CL (ref >60.00)
Glucose, Bld: 115 mg/dL — ABNORMAL HIGH (ref 70–99)
Potassium: 5.1 mEq/L (ref 3.5–5.1)
Sodium: 137 mEq/L (ref 135–145)

## 2022-12-14 LAB — D-DIMER, QUANTITATIVE: D-Dimer, Quant: 2.32 mcg/mL FEU — ABNORMAL HIGH (ref <0.50)

## 2022-12-14 NOTE — Progress Notes (Signed)
Please print these and fax to her nephrologist office - calcium and hemoglobin are dropping.  Also see if she can pick up an iFob for the drop in Hgb.  Remind her of the importance of the VQ scan and encourage her to go to the ED if she is having any trouble getting it or if symptoms worsen at all.  (Discussed with Dr. Etter Sjogren)

## 2022-12-14 NOTE — Telephone Encounter (Signed)
Pt called to see who nephrologist is .[name] Kitchen Lvm to return call with provider info

## 2022-12-14 NOTE — Telephone Encounter (Signed)
Pt aware.

## 2022-12-14 NOTE — Telephone Encounter (Signed)
CRITICAL VALUE STICKER  CRITICAL VALUE:   Creatine 4.50  GFR 9.63  RECEIVER (on-site recipient of call): [facility]  DATE & TIME NOTIFIED: 12/14/2022 12:25 pm  MESSENGER (representative from lab):  MD NOTIFIED: Caleen Jobs, NP notified at 12:30 pm  TIME OF NOTIFICATION:  RESPONSE:

## 2022-12-14 NOTE — Telephone Encounter (Signed)
Pt was returning Shaakira's call for labs.

## 2022-12-15 ENCOUNTER — Other Ambulatory Visit (HOSPITAL_COMMUNITY): Payer: Self-pay

## 2022-12-15 NOTE — Telephone Encounter (Signed)
Prior Auth (EOC) ID:  [id number] [email or web address]

## 2022-12-15 NOTE — Telephone Encounter (Signed)
Labs faxed to [facility]

## 2022-12-18 ENCOUNTER — Encounter: Admit: 2022-12-18 | Discharge: 2022-12-18 | Payer: MEDICARE

## 2022-12-18 DIAGNOSIS — R110 Nausea: Secondary | ICD-10-CM

## 2022-12-19 ENCOUNTER — Ambulatory Visit (INDEPENDENT_AMBULATORY_CARE_PROVIDER_SITE_OTHER): Payer: PRIVATE HEALTH INSURANCE | Admitting: *Deleted

## 2022-12-19 ENCOUNTER — Telehealth: Payer: Self-pay

## 2022-12-19 ENCOUNTER — Encounter: Admit: 2022-12-19 | Discharge: 2022-12-19 | Payer: MEDICARE

## 2022-12-19 DIAGNOSIS — M810 Age-related osteoporosis without current pathological fracture: Secondary | ICD-10-CM

## 2022-12-19 DIAGNOSIS — E1129 Type 2 diabetes mellitus with other diabetic kidney complication: Secondary | ICD-10-CM

## 2022-12-19 MED ORDER — ROMOSOZUMAB-AQQG 105 MG/1.17ML ~~LOC~~ SOSY
210.0000 mg | PREFILLED_SYRINGE | Freq: Once | SUBCUTANEOUS | Status: AC
  Administered 2022-12-19: 210 mg via SUBCUTANEOUS

## 2022-12-19 NOTE — Telephone Encounter (Signed)
Also patient was notified by letter from [name] back in 08/2022 that it would be covered and she stated that she got a letter from ins that injection was covered.

## 2022-12-19 NOTE — Progress Notes (Addendum)
Patient here today for #3 Evenity injection, per physician's orders.   Evenity was administered L arm today. Patient tolerated injection.   Patient due for follow up labs/provider appt: No.   Patient next injection due: 1 month, appt made: yes

## 2022-12-19 NOTE — Progress Notes (Signed)
Called pt lvm for her to call office to get her VQ scan done

## 2022-12-19 NOTE — Telephone Encounter
Hospital Discharge Follow Up      Reached Patient: Yes, patient was identified, for their safety, using dual identification of name and date of birth  Patient Date of Birth: 1956-01-08     Admission Information:     Hospital Name: [facility]  Admission Date: 12/17/22    Discharge Date: 12/18/22    Admission Diagnosis: Chest pain  Discharge Diagnosis: AKI  Has there been a discharge within the last 30 days? No   Hospital Services: Unplanned  Today's call is 1 (business) days post discharge      Discharge Instruction Review   Did patient receive and understand discharge instructions? Yes    Home Health ordered? No  Caregiver assistance in the home? Yes   Are there concerns regarding the patient's ADL'S? No  Is patient a fall risk? No    Special diet? No      Medication Reconciliation    Changes to pre-hospital medications? Yes    Start: sodium bicarbonate 650 mg tablet, take one tablet by mouth twice daily    Were new prescriptions filled? Yes  Meds reviewed and reconciled? Yes    Current Outpatient Medications   Medication Instructions    ACCU-CHEK AVIVA PLUS TEST STRP test strip TEST EVERY DAY BEFORE BREAKFAST AS DIRECTED    ACCU-CHEK GUIDE GLUCOSE METER kit USE AS DIRECTED TO CHECK BLOOD SUGARS    albuterol sulfate (PROAIR HFA) 90 mcg/actuation HFA aerosol inhaler 2 puffs, Inhalation, EVERY  6 HOURS PRN    amLODIPine (NORVASC) 10 mg tablet TAKE 1 TABLET EVERY DAY    azelastine (ASTELIN) 137 mcg (0.1 %) nasal spray 2 sprays, Each Nostril, TWICE DAILY    betamethasone valerate (VALISONE) 0.1 % topical cream APPLY TOPICALLY TO AFFECTED AREA DAILY    Blood Glucose Control, Normal soln Use in glucose meter, Accu-check Aviva    budesonide-formoterol HFA (SYMBICORT) 160-4.5 mcg/actuation aerosol inhaler 2 puffs, Inhalation, TWICE DAILY    carvediloL (COREG) 25 mg, Oral, TWICE DAILY WITH MEALS, Take with food.    cetirizine (ZYRTEC) 10 mg, Oral, DAILY,      CHOLEcalciferoL (vitamin D3) (VITAMIN D3) 2,000 Units, Oral, DAILY    cloNIDine (CATAPRES-TTS-1) 0.1 mg/day patch 1 patch, Transdermal, EVERY  7 DAYS    cyanocobalamin (vitamin B-12) 5,000 mcg cap 1 tablet, Oral, DAILY    cyclobenzaprine (FLEXERIL) 5 mg tablet TAKE 1 TABLET AT BEDTIME    DROPSAFE ALCOHOL PREP PADS USE TO CLEAN SKIN BEFORE FINGERSTICK    ezetimibe (ZETIA) 10 mg, Oral, DAILY    famotidine (PEPCID) 20 mg tablet TAKE 1 TABLET TWICE DAILY    ferrous sulfate (FEOSOL) 325 mg, Oral, EVERY 48 HOURS    fluticasone propionate (FLONASE) 50 mcg/actuation nasal spray, suspension USE 2 SPRAYS IN EACH NOSTRIL AS DIRECTED DAILY. SHAKE BOTTLE GENTLY BEFORE USING    glimepiride (AMARYL) 4 mg, Oral, EVERY MORNING    irbesartan (AVAPRO) 300 mg, Oral, AT BEDTIME DAILY    lancets MISC 1 each, Test, DAILY BEFORE BREAKFAST, Diag: E11.29    mirtazapine (REMERON) 15 mg, Oral, AT BEDTIME DAILY    pantoprazole DR (PROTONIX) 40 mg, Oral, DAILY    pioglitazone (ACTOS) 45 mg, Oral, DAILY    sodium bicarbonate 650 mg, Oral, TWICE DAILY         Understanding Condition   Having any current symptoms? No pt feeling well, no chest pain or dizziness note, taking medications without issue. Exercised at the gym this morning with no concerns.  Do you have a history of Heart Failure? No    Patient understands when to seek additional medical care? Yes   Other items discussed: follow up      Scheduling Follow-up Appointment   Upcoming appointments:   Future Appointments   Date Time Provider Department Center   12/26/2022 10:00 AM Shela Leff, MD [facility] Community   01/04/2023  8:30 AM Lynelle Smoke, [facility]   01/26/2023  9:00 AM Doran Durand, MD [facility] IM   04/03/2023 11:30 AM Shela Leff, MD [facility]     Does the patient require 7 day follow up appointment? No  Hospital Follow-Up scheduled with PCP? Yes, Date: 12/26/22   When was patient?s last PCP visit: 08/31/2022   PCP primary location: [facility]  Specialist appointment scheduled? Yes, with Nephrology 01/26/23  Is assistance with transportation needed? No   MyChart message sent? Active in MyChart. No message sent.   Artera text sent? No    Doreatha Martin, RN

## 2022-12-19 NOTE — Progress Notes (Signed)
Pt aware.

## 2022-12-19 NOTE — Telephone Encounter (Signed)
Envinity denied. See media for full report.

## 2022-12-20 ENCOUNTER — Telehealth: Payer: Self-pay | Admitting: Family Medicine

## 2022-12-20 ENCOUNTER — Other Ambulatory Visit (INDEPENDENT_AMBULATORY_CARE_PROVIDER_SITE_OTHER): Payer: PRIVATE HEALTH INSURANCE

## 2022-12-20 ENCOUNTER — Other Ambulatory Visit: Payer: Self-pay

## 2022-12-20 ENCOUNTER — Ambulatory Visit (HOSPITAL_BASED_OUTPATIENT_CLINIC_OR_DEPARTMENT_OTHER): Payer: PRIVATE HEALTH INSURANCE | Admitting: General Surgery

## 2022-12-20 DIAGNOSIS — G894 Chronic pain syndrome: Secondary | ICD-10-CM

## 2022-12-20 DIAGNOSIS — Z981 Arthrodesis status: Secondary | ICD-10-CM

## 2022-12-20 DIAGNOSIS — D649 Anemia, unspecified: Secondary | ICD-10-CM

## 2022-12-20 MED ORDER — DULOXETINE HCL 60 MG PO CPEP: 60.0000 mg | ORAL_CAPSULE | Freq: Every day | ORAL | 0 refills | Status: DC

## 2022-12-20 NOTE — Addendum Note (Signed)
Addended by: Manuela Schwartz on: 12/20/2022 04:40 PM   Modules accepted: Orders

## 2022-12-20 NOTE — Progress Notes (Signed)
Ifob order

## 2022-12-20 NOTE — Telephone Encounter (Signed)
Refill: DULoxetine (CYMBALTA) 60 MG capsule [id number]     Pharmacy: [facility] [address]

## 2022-12-20 NOTE — Telephone Encounter (Signed)
Rx sent 

## 2022-12-21 ENCOUNTER — Encounter: Admit: 2022-12-21 | Discharge: 2022-12-21 | Payer: MEDICARE

## 2022-12-21 MED ORDER — CARVEDILOL 25 MG PO TAB
25 mg | ORAL_TABLET | Freq: Two times a day (BID) | ORAL | 3 refills | 90.00000 days | Status: AC
Start: 2022-12-21 — End: ?

## 2022-12-22 ENCOUNTER — Telehealth: Payer: Self-pay

## 2022-12-22 DIAGNOSIS — R195 Other fecal abnormalities: Secondary | ICD-10-CM

## 2022-12-22 LAB — FECAL OCCULT BLOOD, IMMUNOCHEMICAL: Fecal Occult Bld: POSITIVE — AB

## 2022-12-22 NOTE — Telephone Encounter (Signed)
PA for Evenity denied. Denial letter with directions for appeal is in media

## 2022-12-22 NOTE — Telephone Encounter (Signed)
CRITICAL VALUE STICKER  CRITICAL VALUE: +IFOB  RECEIVER (on-site recipient of call): Charde Macfarlane  DATE & TIME NOTIFIED: 12/22/22 3:44pm  MESSENGER (representative from lab): Earnest Bailey  MD NOTIFIED: Etter Sjogren  TIME OF NOTIFICATION: 3:44pm  RESPONSE:

## 2022-12-22 NOTE — Telephone Encounter (Signed)
LMOM informing Pt of results and recommendations to see GI. Instructed to call Monday if questions. GI referral placed.

## 2022-12-22 NOTE — Telephone Encounter (Signed)
Did you see the phone note from 12/19/22 about this denial and what Dr. Etter Sjogren had to say?  It was from Gaylyn Rong, [place].
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
## 2022-12-26 ENCOUNTER — Inpatient Hospital Stay: Payer: PRIVATE HEALTH INSURANCE

## 2022-12-26 ENCOUNTER — Ambulatory Visit: Admit: 2022-12-26 | Discharge: 2022-12-27 | Payer: MEDICARE

## 2022-12-26 ENCOUNTER — Encounter: Admit: 2022-12-26 | Discharge: 2022-12-26 | Payer: MEDICARE

## 2022-12-26 VITALS — BP 123/64 | HR 62 | Temp 98.6°F | Resp 18

## 2022-12-26 DIAGNOSIS — D500 Iron deficiency anemia secondary to blood loss (chronic): Secondary | ICD-10-CM

## 2022-12-26 DIAGNOSIS — I129 Hypertensive chronic kidney disease with stage 1 through stage 4 chronic kidney disease, or unspecified chronic kidney disease: Secondary | ICD-10-CM | POA: Diagnosis not present

## 2022-12-26 DIAGNOSIS — N1832 Stage 3b chronic kidney disease (HCC): Secondary | ICD-10-CM

## 2022-12-26 DIAGNOSIS — D631 Anemia in chronic kidney disease: Secondary | ICD-10-CM

## 2022-12-26 DIAGNOSIS — D508 Other iron deficiency anemias: Secondary | ICD-10-CM

## 2022-12-26 DIAGNOSIS — K589 Irritable bowel syndrome without diarrhea: Secondary | ICD-10-CM

## 2022-12-26 DIAGNOSIS — E785 Hyperlipidemia, unspecified: Secondary | ICD-10-CM

## 2022-12-26 DIAGNOSIS — R0789 Other chest pain: Secondary | ICD-10-CM

## 2022-12-26 DIAGNOSIS — Z09 Encounter for follow-up examination after completed treatment for conditions other than malignant neoplasm: Secondary | ICD-10-CM

## 2022-12-26 DIAGNOSIS — G5600 Carpal tunnel syndrome, unspecified upper limb: Secondary | ICD-10-CM

## 2022-12-26 DIAGNOSIS — I10 Essential (primary) hypertension: Secondary | ICD-10-CM

## 2022-12-26 DIAGNOSIS — E119 Type 2 diabetes mellitus without complications: Secondary | ICD-10-CM

## 2022-12-26 DIAGNOSIS — E559 Vitamin D deficiency, unspecified: Secondary | ICD-10-CM

## 2022-12-26 DIAGNOSIS — G9050 Complex regional pain syndrome I, unspecified: Secondary | ICD-10-CM

## 2022-12-26 DIAGNOSIS — N184 Chronic kidney disease, stage 4 (severe): Secondary | ICD-10-CM

## 2022-12-26 DIAGNOSIS — J45909 Unspecified asthma, uncomplicated: Secondary | ICD-10-CM

## 2022-12-26 DIAGNOSIS — K5900 Constipation, unspecified: Secondary | ICD-10-CM

## 2022-12-26 DIAGNOSIS — K2970 Gastritis, unspecified, without bleeding: Secondary | ICD-10-CM

## 2022-12-26 DIAGNOSIS — F22 Delusional disorders: Secondary | ICD-10-CM

## 2022-12-26 DIAGNOSIS — J309 Allergic rhinitis, unspecified: Secondary | ICD-10-CM

## 2022-12-26 DIAGNOSIS — M79642 Pain in left hand: Secondary | ICD-10-CM

## 2022-12-26 DIAGNOSIS — D649 Anemia, unspecified: Secondary | ICD-10-CM

## 2022-12-26 LAB — HEMOGLOBIN A1C: HEMOGLOBIN A1C: 7 % — ABNORMAL HIGH (ref 4.0–5.7)

## 2022-12-26 LAB — BASIC METABOLIC PANEL
ANION GAP: 10 pg (ref 3–12)
BLD UREA NITROGEN: 24 mg/dL (ref 7–25)
CALCIUM: 10 mg/dL (ref 8.5–10.6)
CHLORIDE: 100 MMOL/L — ABNORMAL LOW (ref 98–110)
CO2: 27 MMOL/L (ref 21–30)
CREATININE: 1.6 mg/dL — ABNORMAL HIGH (ref 0.4–1.00)
EGFR: 34 mL/min — ABNORMAL LOW (ref >60)
GLUCOSE,PANEL: 197 mg/dL — ABNORMAL HIGH (ref 70–100)
SODIUM: 137 MMOL/L (ref 137–147)

## 2022-12-26 LAB — CBC AND DIFF
ABSOLUTE BASO COUNT: 0 K/UL (ref 0–0.20)
ABSOLUTE EOS COUNT: 0.2 K/UL (ref 0–0.45)
ABSOLUTE LYMPH COUNT: 1.6 K/UL (ref 1.0–4.8)
ABSOLUTE MONO COUNT: 0.6 K/UL (ref 0–0.80)
ABSOLUTE NEUTROPHIL: 4.6 K/UL (ref 1.8–7.0)
BASOPHILS %: 1 % (ref 0–2)
EOSINOPHILS %: 3 % (ref 0–5)
LYMPHOCYTES %: 23 % — ABNORMAL LOW (ref 24–44)
MONOCYTES %: 10 % (ref 4–12)
WBC COUNT: 7.2 K/UL (ref 4.5–11.0)

## 2022-12-26 LAB — CBC WITH DIFFERENTIAL (CANCER CENTER ONLY)
Abs Immature Granulocytes: 0.03 10*3/uL (ref 0.00–0.07)
Basophils Absolute: 0 10*3/uL (ref 0.0–0.1)
Basophils Relative: 1 %
Eosinophils Absolute: 0.3 10*3/uL (ref 0.0–0.5)
Eosinophils Relative: 4 %
HCT: 29.5 % — ABNORMAL LOW (ref 36.0–46.0)
Hemoglobin: 9 g/dL — ABNORMAL LOW (ref 12.0–15.0)
Immature Granulocytes: 1 %
Lymphocytes Relative: 13 %
Lymphs Abs: 0.9 10*3/uL (ref 0.7–4.0)
MCH: 28.7 pg (ref 26.0–34.0)
MCHC: 30.5 g/dL (ref 30.0–36.0)
MCV: 93.9 fL (ref 80.0–100.0)
Monocytes Absolute: 0.5 10*3/uL (ref 0.1–1.0)
Monocytes Relative: 7 %
Neutro Abs: 4.8 10*3/uL (ref 1.7–7.7)
Neutrophils Relative %: 74 %
Platelet Count: 132 10*3/uL — ABNORMAL LOW (ref 150–400)
RBC: 3.14 MIL/uL — ABNORMAL LOW (ref 3.87–5.11)
RDW: 15.3 % (ref 11.5–15.5)
WBC Count: 6.4 10*3/uL (ref 4.0–10.5)
nRBC: 0 % (ref 0.0–0.2)

## 2022-12-26 LAB — CMP (CANCER CENTER ONLY)
ALT: 5 U/L (ref 0–44)
AST: 11 U/L — ABNORMAL LOW (ref 15–41)
Albumin: 3.5 g/dL (ref 3.5–5.0)
Alkaline Phosphatase: 145 U/L — ABNORMAL HIGH (ref 38–126)
Anion gap: 10 (ref 5–15)
BUN: 98 mg/dL — ABNORMAL HIGH (ref 8–23)
CO2: 18 mmol/L — ABNORMAL LOW (ref 22–32)
Calcium: 7.2 mg/dL — ABNORMAL LOW (ref 8.9–10.3)
Chloride: 108 mmol/L (ref 98–111)
Creatinine: 4.87 mg/dL — ABNORMAL HIGH (ref 0.44–1.00)
GFR, Estimated: 9 mL/min — ABNORMAL LOW (ref >60)
Glucose, Bld: 80 mg/dL (ref 70–99)
Potassium: 5.1 mmol/L (ref 3.5–5.1)
Sodium: 136 mmol/L (ref 135–145)
Total Bilirubin: 0.4 mg/dL (ref 0.3–1.2)
Total Protein: 7.7 g/dL (ref 6.5–8.1)

## 2022-12-26 LAB — SAMPLE TO BLOOD BANK

## 2022-12-26 MED ORDER — BLOOD-GLUCOSE METER MISC KIT
0 refills | Status: AC
Start: 2022-12-26 — End: ?

## 2022-12-26 MED ORDER — EPOETIN ALFA-EPBX 40000 UNIT/ML IJ SOLN
40000.0000 [IU] | Freq: Once | INTRAMUSCULAR | Status: AC
  Administered 2022-12-26: 40000 [IU] via SUBCUTANEOUS
  Filled 2022-12-26: qty 1

## 2022-12-26 NOTE — Patient Instructions (Signed)
Epoetin Alfa Injection What is this medication? EPOETIN ALFA (e POE e tin AL fa) treats low levels of red blood cells (anemia) caused by kidney disease, chemotherapy, or HIV medications. It can also be used in people who are at risk for blood loss during surgery. It works by helping your body make more red blood cells, which reduces the need for blood transfusions. This medicine may be used for other purposes; ask your health care provider or pharmacist if you have questions. COMMON BRAND NAME(S): Epogen, Procrit, Retacrit What should I tell my care team before I take this medication? They need to know if you have any of these conditions: Blood clots Cancer Heart disease High blood pressure On dialysis Seizures Stroke An unusual or allergic reaction to epoetin alfa, albumin, benzyl alcohol, other medications, foods, dyes, or preservatives Pregnant or trying to get pregnant Breast-feeding How should I use this medication? This medication is injected into a vein or under the skin. It is usually given by your care team in a hospital or clinic setting. It may also be given at home. If you get this medication at home, you will be taught how to prepare and give it. Use exactly as directed. Take it as directed on the prescription label at the same time every day. Keep taking it unless your care team tells you to stop. It is important that you put your used needles and syringes in a special sharps container. Do not put them in a trash can. If you do not have a sharps container, call your pharmacist or care team to get one. A special MedGuide will be given to you by the pharmacist with each prescription and refill. Be sure to read this information carefully each time. Talk to your care team about the use of this medication in children. While this medication may be used in children as young as 1 month of age for selected conditions, precautions do apply. Overdosage: If you think you have taken too much  of this medicine contact a poison control center or emergency room at once. NOTE: This medicine is only for you. Do not share this medicine with others. What if I miss a dose? If you miss a dose, take it as soon as you can. If it is almost time for your next dose, take only that dose. Do not take double or extra doses. What may interact with this medication? Darbepoetin alfa Methoxy polyethylene glycol-epoetin beta This list may not describe all possible interactions. Give your health care provider a list of all the medicines, herbs, non-prescription drugs, or dietary supplements you use. Also tell them if you smoke, drink alcohol, or use illegal drugs. Some items may interact with your medicine. What should I watch for while using this medication? Visit your care team for regular checks on your progress. Check your blood pressure as directed. Know what your blood pressure should be and when to contact your care team. Your condition will be monitored carefully while you are receiving this medication. You may need blood work while taking this medication. What side effects may I notice from receiving this medication? Side effects that you should report to your care team as soon as possible: Allergic reactions--skin rash, itching, hives, swelling of the face, lips, tongue, or throat Blood clot--pain, swelling, or warmth in the leg, shortness of breath, chest pain Heart attack--pain or tightness in the chest, shoulders, arms, or jaw, nausea, shortness of breath, cold or clammy skin, feeling faint or lightheaded Increase   in blood pressure Rash, fever, and swollen lymph nodes Redness, blistering, peeling, or loosening of the skin, including inside the mouth Seizures Stroke--sudden numbness or weakness of the face, arm, or leg, trouble speaking, confusion, trouble walking, loss of balance or coordination, dizziness, severe headache, change in vision Side effects that usually do not require medical  attention (report to your care team if they continue or are bothersome): Bone, joint, or muscle pain Cough Headache Nausea Pain, redness, or irritation at injection site This list may not describe all possible side effects. Call your doctor for medical advice about side effects. You may report side effects to FDA at 1-800-FDA-1088. Where should I keep my medication? Keep out of the reach of children and pets. Store in a refrigerator. Do not freeze. Do not shake. Protect from light. Keep this medication in the original container until you are ready to take it. See product for storage information. Get rid of any unused medication after the expiration date. To get rid of medications that are no longer needed or have expired: Take the medication to a medication take-back program. Check with your pharmacy or law enforcement to find a location. If you cannot return the medication, ask your pharmacist or care team how to get rid of the medication safely. NOTE: This sheet is a summary. It may not cover all possible information. If you have questions about this medicine, talk to your doctor, pharmacist, or health care provider.  2023 Elsevier/Gold Standard (2022-01-24 00:00:00)  

## 2022-12-27 ENCOUNTER — Inpatient Hospital Stay: Payer: PRIVATE HEALTH INSURANCE

## 2022-12-27 ENCOUNTER — Encounter: Admit: 2022-12-27 | Discharge: 2022-12-27 | Payer: MEDICARE

## 2022-12-27 DIAGNOSIS — R809 Proteinuria, unspecified: Secondary | ICD-10-CM

## 2022-12-27 DIAGNOSIS — E1129 Type 2 diabetes mellitus with other diabetic kidney complication: Secondary | ICD-10-CM

## 2022-12-27 MED ORDER — CYCLOBENZAPRINE 5 MG PO TAB
ORAL_TABLET | ORAL | 3 refills | 30.00000 days | Status: AC
Start: 2022-12-27 — End: ?
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
## 2022-12-28 NOTE — Telephone Encounter (Signed)
We do not have a pharmacist right now for appeals. Here is the information from the denial letter about how to appeal. Denial letter is also in media.

## 2023-01-01 ENCOUNTER — Other Ambulatory Visit: Payer: Self-pay | Admitting: Family Medicine

## 2023-01-01 DIAGNOSIS — Z981 Arthrodesis status: Secondary | ICD-10-CM

## 2023-01-01 DIAGNOSIS — G894 Chronic pain syndrome: Secondary | ICD-10-CM

## 2023-01-01 DIAGNOSIS — M8610 Other acute osteomyelitis, unspecified site: Secondary | ICD-10-CM

## 2023-01-01 MED ORDER — HYDROCODONE-ACETAMINOPHEN 5-325 MG PO TABS: 1.0000 | ORAL_TABLET | Freq: Four times a day (QID) | ORAL | 0 refills | Status: DC | PRN

## 2023-01-01 NOTE — Telephone Encounter (Signed)
Requesting:NORCO Contract: 02/14/22 UDS: 02/14/22 Last OV: 12/13/22 Next OV: 01/18/23----Nurse visit Last Refill: 12/05/22, #120--0 RF Database:   Please advise

## 2023-01-01 NOTE — Telephone Encounter (Signed)
Prescription Request  01/01/2023  Is this a "Controlled Substance" medicine? Yes  LOV: 09/07/2022  What is the name of the medication or equipment?   HYDROcodone-acetaminophen (NORCO/VICODIN) 5-325 MG tablet [id number]   Have you contacted your pharmacy to request a refill? No   Which pharmacy would you like this sent to?   [facility] [place], [place] - [address] [place] [address] Phone: [phone] Fax: [phone]   Patient notified that their request is being sent to the clinical staff for review and that they should receive a response within 2 business days.   Please advise at Mobile [phone] (mobile)

## 2023-01-02 ENCOUNTER — Telehealth: Payer: Self-pay | Admitting: Family Medicine

## 2023-01-02 ENCOUNTER — Encounter: Admit: 2023-01-02 | Discharge: 2023-01-02 | Payer: MEDICARE

## 2023-01-02 DIAGNOSIS — I10 Essential (primary) hypertension: Secondary | ICD-10-CM

## 2023-01-02 MED ORDER — CARVEDILOL 12.5 MG PO TABS: 12.5000 mg | ORAL_TABLET | Freq: Every day | ORAL | 3 refills | Status: DC

## 2023-01-02 NOTE — Telephone Encounter (Signed)
Appeal letter faxed.

## 2023-01-02 NOTE — Telephone Encounter (Signed)
Refill sent.

## 2023-01-02 NOTE — Telephone Encounter (Signed)
Pt stated she needs a refill on bp meds. She does not remember the name of rx. Please advise.    [facility] [address] [place], [place] - [address] [place] [address] Phone: [phone]  Fax: [phone]

## 2023-01-04 ENCOUNTER — Ambulatory Visit: Admit: 2023-01-04 | Discharge: 2023-01-05 | Payer: MEDICARE

## 2023-01-04 ENCOUNTER — Encounter: Admit: 2023-01-04 | Discharge: 2023-01-04 | Payer: MEDICARE
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
## 2023-01-10 ENCOUNTER — Inpatient Hospital Stay: Payer: PRIVATE HEALTH INSURANCE | Admitting: Family

## 2023-01-10 ENCOUNTER — Inpatient Hospital Stay: Payer: PRIVATE HEALTH INSURANCE

## 2023-01-10 NOTE — Telephone Encounter (Signed)
Appeal denied- full letter in Media:

## 2023-01-11 ENCOUNTER — Encounter: Admit: 2023-01-11 | Discharge: 2023-01-11 | Payer: MEDICARE

## 2023-01-11 DIAGNOSIS — E1129 Type 2 diabetes mellitus with other diabetic kidney complication: Secondary | ICD-10-CM

## 2023-01-11 MED ORDER — BLOOD-GLUCOSE METER MISC KIT
0 refills | Status: AC
Start: 2023-01-11 — End: ?

## 2023-01-11 MED ORDER — LANCETS MISC MISC
1 | Freq: Every day | 0 refills | Status: AC
Start: 2023-01-11 — End: ?

## 2023-01-11 MED ORDER — FLUCONAZOLE 150 MG PO TAB
150 mg | ORAL_TABLET | Freq: Every day | ORAL | 0 refills | 3.00000 days | Status: AC
Start: 2023-01-11 — End: ?

## 2023-01-11 MED ORDER — ACCU-CHEK AVIVA PLUS TEST STRP MISC STRP
1 | ORAL_STRIP | Freq: Every day | 0 refills | 90.00000 days | Status: AC
Start: 2023-01-11 — End: ?

## 2023-01-11 NOTE — Telephone Encounter (Signed)
Can you check into this?  I sent to rx auth team before I found out not to send it when it is first approved, but they sent back that it was denied by the rx benefit plan.  It looks like you have maybe sent through the medical benefit, [place].  Her appointment is next week and I want to make sure she will not get a huge bill from [name].

## 2023-01-11 NOTE — Telephone Encounter
Reason for Disposition   Symptoms of a yeast infection (i.e., itchy, white discharge, not bad smelling) and feels like prior vaginal yeast infections    Answer Assessment - Initial Assessment Questions  1. SYMPTOM: "What's the main symptom you're concerned about?" (e.g., pain, itching, dryness)     Vaginal itching     2. LOCATION: "Where is the  itching located?" (e.g., inside/outside, left/right)    Perineal area    3. ONSET: "When did the  symptoms  start?"     Has noticed for several weeks, forgot to mention at  last appt.    4. PAIN: "Is there any pain?" If Yes, ask: "How bad is it?" (Scale: 1-10; mild, moderate, severe)      Does not have pain but has itching and irritation.    5. ITCHING: "Is there any itching?" If Yes, ask: "How bad is it?" (Scale: 1-10; mild, moderate, severe)     Yes - mild to moderate    6. CAUSE: "What do you think is causing the discharge?" "Have you had the same problem before? What happened then?"     White discharge, thinks she has a yeast infection  Uses [facility] [address] if medication is recommended.    7. OTHER SYMPTOMS: "Do you have any other symptoms?" (e.g., fever, itching, vaginal bleeding, pain with urination, injury to genital area, vaginal foreign body)    Denies any bleeding or pelvic pain.    8. PREGNANCY: "Is there any chance you are pregnant?" "When was your last menstrual period?"     No    Protocols used: Vaginal Symptoms-A-[place]

## 2023-01-11 NOTE — Telephone Encounter
LM on VM

## 2023-01-11 NOTE — Telephone Encounter
I sent a Rx for Diflucan to her pharmacy.

## 2023-01-12 ENCOUNTER — Encounter: Payer: Self-pay | Admitting: Family

## 2023-01-12 ENCOUNTER — Inpatient Hospital Stay (HOSPITAL_BASED_OUTPATIENT_CLINIC_OR_DEPARTMENT_OTHER): Payer: PRIVATE HEALTH INSURANCE | Admitting: Family

## 2023-01-12 ENCOUNTER — Inpatient Hospital Stay: Payer: PRIVATE HEALTH INSURANCE

## 2023-01-12 ENCOUNTER — Inpatient Hospital Stay: Payer: PRIVATE HEALTH INSURANCE | Attending: Hematology & Oncology

## 2023-01-12 VITALS — BP 157/57 | HR 61 | Temp 98.1°F | Resp 18

## 2023-01-12 DIAGNOSIS — D631 Anemia in chronic kidney disease: Secondary | ICD-10-CM | POA: Diagnosis present

## 2023-01-12 DIAGNOSIS — Z86718 Personal history of other venous thrombosis and embolism: Secondary | ICD-10-CM | POA: Insufficient documentation

## 2023-01-12 DIAGNOSIS — Z7901 Long term (current) use of anticoagulants: Secondary | ICD-10-CM | POA: Diagnosis not present

## 2023-01-12 DIAGNOSIS — N183 Chronic kidney disease, stage 3 unspecified: Secondary | ICD-10-CM | POA: Diagnosis not present

## 2023-01-12 DIAGNOSIS — I129 Hypertensive chronic kidney disease with stage 1 through stage 4 chronic kidney disease, or unspecified chronic kidney disease: Secondary | ICD-10-CM | POA: Insufficient documentation

## 2023-01-12 DIAGNOSIS — N1832 Chronic kidney disease, stage 3b: Secondary | ICD-10-CM

## 2023-01-12 DIAGNOSIS — D500 Iron deficiency anemia secondary to blood loss (chronic): Secondary | ICD-10-CM

## 2023-01-12 DIAGNOSIS — D508 Other iron deficiency anemias: Secondary | ICD-10-CM

## 2023-01-12 DIAGNOSIS — D649 Anemia, unspecified: Secondary | ICD-10-CM | POA: Diagnosis not present

## 2023-01-12 LAB — CMP (CANCER CENTER ONLY)
ALT: 5 U/L (ref 0–44)
AST: 10 U/L — ABNORMAL LOW (ref 15–41)
Albumin: 3.5 g/dL (ref 3.5–5.0)
Alkaline Phosphatase: 137 U/L — ABNORMAL HIGH (ref 38–126)
Anion gap: 9 (ref 5–15)
BUN: 66 mg/dL — ABNORMAL HIGH (ref 8–23)
CO2: 20 mmol/L — ABNORMAL LOW (ref 22–32)
Calcium: 7.9 mg/dL — ABNORMAL LOW (ref 8.9–10.3)
Chloride: 107 mmol/L (ref 98–111)
Creatinine: 4.68 mg/dL — ABNORMAL HIGH (ref 0.44–1.00)
GFR, Estimated: 10 mL/min — ABNORMAL LOW (ref >60)
Glucose, Bld: 85 mg/dL (ref 70–99)
Potassium: 5.6 mmol/L — ABNORMAL HIGH (ref 3.5–5.1)
Sodium: 136 mmol/L (ref 135–145)
Total Bilirubin: 0.5 mg/dL (ref 0.3–1.2)
Total Protein: 7.8 g/dL (ref 6.5–8.1)

## 2023-01-12 LAB — CBC WITH DIFFERENTIAL (CANCER CENTER ONLY)
Abs Immature Granulocytes: 0.06 10*3/uL (ref 0.00–0.07)
Basophils Absolute: 0 10*3/uL (ref 0.0–0.1)
Basophils Relative: 1 %
Eosinophils Absolute: 0.2 10*3/uL (ref 0.0–0.5)
Eosinophils Relative: 3 %
HCT: 27.8 % — ABNORMAL LOW (ref 36.0–46.0)
Hemoglobin: 8.3 g/dL — ABNORMAL LOW (ref 12.0–15.0)
Immature Granulocytes: 1 %
Lymphocytes Relative: 12 %
Lymphs Abs: 0.6 10*3/uL — ABNORMAL LOW (ref 0.7–4.0)
MCH: 27.9 pg (ref 26.0–34.0)
MCHC: 29.9 g/dL — ABNORMAL LOW (ref 30.0–36.0)
MCV: 93.3 fL (ref 80.0–100.0)
Monocytes Absolute: 0.4 10*3/uL (ref 0.1–1.0)
Monocytes Relative: 8 %
Neutro Abs: 4 10*3/uL (ref 1.7–7.7)
Neutrophils Relative %: 75 %
Platelet Count: 120 10*3/uL — ABNORMAL LOW (ref 150–400)
RBC: 2.98 MIL/uL — ABNORMAL LOW (ref 3.87–5.11)
RDW: 15.5 % (ref 11.5–15.5)
WBC Count: 5.3 10*3/uL (ref 4.0–10.5)
nRBC: 0 % (ref 0.0–0.2)

## 2023-01-12 LAB — RETICULOCYTES
Immature Retic Fract: 9.4 % (ref 2.3–15.9)
RBC.: 2.95 MIL/uL — ABNORMAL LOW (ref 3.87–5.11)
Retic Count, Absolute: 77 10*3/uL (ref 19.0–186.0)
Retic Ct Pct: 2.6 % (ref 0.4–3.1)

## 2023-01-12 LAB — SAMPLE TO BLOOD BANK

## 2023-01-12 LAB — FERRITIN: Ferritin: 277 ng/mL (ref 11–307)

## 2023-01-12 MED ORDER — EPOETIN ALFA-EPBX 40000 UNIT/ML IJ SOLN
40000.0000 [IU] | Freq: Once | INTRAMUSCULAR | Status: AC
  Administered 2023-01-12: 40000 [IU] via SUBCUTANEOUS
  Filled 2023-01-12: qty 1

## 2023-01-12 NOTE — Progress Notes (Signed)
Hematology and Oncology Follow Up Visit  [name] [name] [id number] 1956-09-09 67 y.o. 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 She notes fatigue at this time and states that she is not sleeping well.  She has not noted any obvious blood loss. No abnormal bruising, no petechiae.  No fever, chills, n/v, cough, rash, dizziness, SOB, chest pain, palpitations, abdominal pain or changes in bowel or bladder habits.  Occasional abdominal pain with constipation.  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  No falls or syncope reported.  Appetite and hydration are fair.   ECOG Performance Status: 1 - Symptomatic but completely ambulatory  Medications:  Allergies as of 01/12/2023       Reactions   Bee Pollen Anaphylaxis   Broccoli [brassica Oleracea] Anaphylaxis, Hives   Fish-derived Products Anaphylaxis, Hives   Mushroom Extract Complex Anaphylaxis   Penicillins Anaphylaxis   **Tolerated cefepime March 2021 Did it involve swelling of the face/tongue/throat, SOB, or low BP? Yes Did it involve sudden or severe rash/hives, skin peeling, or any reaction on the inside of your mouth or nose? No Did you need to seek medical attention at a hospital or doctor's office? Yes When did it last happen?  A few months ago If all above answers are "NO", may proceed with cephalosporin use.   Rosemary Oil Anaphylaxis   Shellfish Allergy Anaphylaxis, Hives   Tomato Anaphylaxis, Hives   Raw tomatoes only, can tolerate if cooked   Acetaminophen Other (See Comments)   GI upset   Aloe Vera Hives   Doxycycline Diarrhea    Acyclovir And Related Other (See Comments)   Unknown reaction   Naproxen Hives        Medication List        Accurate as of January 12, 2023  3:53 PM. If you have any questions, ask your nurse or doctor.          acetaminophen 325 MG tablet Commonly known as: TYLENOL Take 2 tablets (650 mg total) by mouth every 6 (six) hours as needed for mild pain, fever or headache (or Fever >/= 101). Discontinue 500/1000 mg dosing   Advair HFA 115-21 MCG/ACT inhaler Generic drug: fluticasone-salmeterol Inhale 2 puffs into the lungs 2 (two) times daily. What changed:  when to take this reasons to take this   albuterol (2.5 MG/3ML) 0.083% nebulizer solution Commonly known as: PROVENTIL Take 3 mLs (2.5 mg total) by nebulization every 6 (six) hours as needed for wheezing or shortness of breath.   albuterol 108 (90 Base) MCG/ACT inhaler Commonly known as: VENTOLIN HFA Inhale 2 puffs into the lungs every 6 (six) hours as needed for wheezing or shortness of breath.   amLODipine 5 MG tablet Commonly known as: NORVASC Take 1 tablet (5 mg total) by mouth daily.   apixaban 2.5 MG Tabs tablet Commonly known as: Eliquis Take 1 tablet (2.5 mg total) by mouth 2 (two) times daily.   atorvastatin 10 MG tablet Commonly known as: LIPITOR Take 1 tablet (10 mg total) by mouth daily.   carvedilol 12.5 MG tablet Commonly known as: COREG  Take 1 tablet (12.5 mg total) by mouth daily.   diazepam 5 MG tablet Commonly known as: VALIUM Take 1 tablet (5 mg total) by mouth every 12 (twelve) hours as needed for anxiety.   DULoxetine 60 MG capsule Commonly known as: CYMBALTA Take 1 capsule (60 mg total) by mouth daily.   ferrous sulfate 325 (65 FE) MG tablet Take 1 tablet (325 mg total) by mouth 2 (two) times daily with a meal.   folic acid 1 MG tablet Commonly known as: FOLVITE Take 2 tablets by mouth once daily   FreeStyle Libre 14 Day Reader Kerrin Mo 1 Device by Does not apply route daily.    FreeStyle Libre 14 Day Sensor Misc 1 Device by Does not apply route daily.   furosemide 20 MG tablet Commonly known as: LASIX Take 2 tablets (40 mg total) by mouth daily.   HYDROcodone-acetaminophen 5-325 MG tablet Commonly known as: NORCO/VICODIN Take 1 tablet by mouth every 6 (six) hours as needed for moderate pain.   Lantus SoloStar 100 UNIT/ML Solostar Pen Generic drug: insulin glargine INJECT 8 UNITS SUBCUTANEOUSLY once a day   lubiprostone 24 MCG capsule Commonly known as: AMITIZA Take 1 capsule (24 mcg total) by mouth 2 (two) times daily with a meal. What changed:  when to take this reasons to take this   montelukast 10 MG tablet Commonly known as: SINGULAIR Take 1 tablet (10 mg total) by mouth at bedtime.   multivitamin with minerals Tabs tablet Take 1 tablet by mouth daily.   NONFORMULARY OR COMPOUNDED ITEM Power wheelchair  #1  as directed   ondansetron 8 MG tablet Commonly known as: ZOFRAN TAKE 1/2 (ONE-HALF) TABLET BY MOUTH EVERY 8 HOURS AS NEEDED FOR NAUSEA FOR VOMITING   pregabalin 75 MG capsule Commonly known as: Lyrica Take 1 capsule (75 mg total) by mouth 3 (three) times daily.   sodium bicarbonate 650 MG tablet Take 1 tablet (650 mg total) by mouth 2 (two) times daily.   tamsulosin 0.4 MG Caps capsule Commonly known as: FLOMAX Take 1 capsule (0.4 mg total) by mouth daily.   VITAMIN D PO Take 5,000 Units by mouth daily.        Allergies:  Allergies  Allergen Reactions   Bee Pollen Anaphylaxis   Broccoli [Brassica Oleracea] Anaphylaxis and Hives   Fish-Derived Products Anaphylaxis and Hives   Mushroom Extract Complex Anaphylaxis   Penicillins Anaphylaxis    **Tolerated cefepime March 2021 Did it involve swelling of the face/tongue/throat, SOB, or low BP? Yes Did it involve sudden or severe rash/hives, skin peeling, or any reaction on the inside of your mouth or nose? No Did you need to seek medical attention at a hospital or doctor's  office? Yes When did it last happen?  A few months ago If all above answers are "NO", may proceed with cephalosporin use.   Rosemary Oil Anaphylaxis   Shellfish Allergy Anaphylaxis and Hives   Tomato Anaphylaxis and Hives    Raw tomatoes only, can tolerate if cooked   Acetaminophen Other (See Comments)    GI upset   Aloe Vera Hives   Doxycycline Diarrhea   Acyclovir And Related Other (See Comments)    Unknown reaction   Naproxen Hives    Past Medical History, Surgical history, Social history, and Family History were reviewed and updated.  Review of Systems: All other 10 point review of systems is negative.   Physical Exam:  oral temperature is 98.1 F (36.7 C). Her blood pressure is  157/57 (abnormal) and her pulse is 61. Her respiration is 18 and oxygen saturation is 100%.   Wt Readings from Last 3 Encounters:  07/08/22 194 lb 14.2 oz (88.4 kg)  03/25/22 160 lb (72.6 kg)  02/14/22 168 lb (76.2 kg)    Ocular: Sclerae unicteric, pupils equal, round and reactive to light Ear-nose-throat: Oropharynx clear, dentition fair Lymphatic: No cervical or supraclavicular adenopathy Lungs no rales or rhonchi, good excursion bilaterally Heart regular rate and rhythm, no murmur appreciated Abd soft, nontender, positive bowel sounds MSK no focal spinal tenderness, no joint edema Neuro: non-focal, well-oriented, appropriate affect Breasts: Deferred   Lab Results  Component Value Date   WBC 5.3 01/12/2023   HGB 8.3 (L) 01/12/2023   HCT 27.8 (L) 01/12/2023   MCV 93.3 01/12/2023   PLT 120 (L) 01/12/2023   Lab Results  Component Value Date   FERRITIN 209 12/06/2022   IRON 46 12/06/2022   TIBC 269 12/06/2022   UIBC 223 12/06/2022   IRONPCTSAT 17 12/06/2022   Lab Results  Component Value Date   RETICCTPCT 2.6 01/12/2023   RBC 2.98 (L) 01/12/2023   RBC 2.95 (L) 01/12/2023   No results found for: "KPAFRELGTCHN", "LAMBDASER", "KAPLAMBRATIO" No results found for: "IGGSERUM",  "IGA", "IGMSERUM" No results found for: "TOTALPROTELP", "ALBUMINELP", "A1GS", "A2GS", "BETS", "BETA2SER", "GAMS", "MSPIKE", "SPEI"   Chemistry      Component Value Date/Time   NA 136 01/12/2023 1419   K 5.6 (H) 01/12/2023 1419   CL 107 01/12/2023 1419   CO2 20 (L) 01/12/2023 1419   BUN 66 (H) 01/12/2023 1419   CREATININE 4.68 (H) 01/12/2023 1419   CREATININE 1.97 (H) 06/24/2020 1431      Component Value Date/Time   CALCIUM 7.9 (L) 01/12/2023 1419   ALKPHOS 137 (H) 01/12/2023 1419   AST 10 (L) 01/12/2023 1419   ALT 5 01/12/2023 1419   BILITOT 0.5 01/12/2023 1419       Impression and Plan: Ms. Vandamme is a very pleasant 67 yo caucasian female with long standing history of iron deficiency anemia as well as history of DVT's in both the right and left peroneal and posterior tibial veins while she was bed bound in the hospital being treated for osteomyelitis.  She had an elevated homocystine level, mildly elevated anticardiolipin antibody IgG level and elevated D-dimer as well with lab work.  ESA given, Hgb 8.3.  Iron studies pending.  Lab check and injection every 3 weeks.  Follow-up in 9 weeks.   Lottie Dawson, NP 3/15/20243:53 PM

## 2023-01-12 NOTE — Patient Instructions (Signed)
Epoetin Alfa Injection What is this medication? EPOETIN ALFA (e POE e tin AL fa) treats low levels of red blood cells (anemia) caused by kidney disease, chemotherapy, or HIV medications. It can also be used in people who are at risk for blood loss during surgery. It works by helping your body make more red blood cells, which reduces the need for blood transfusions. This medicine may be used for other purposes; ask your health care provider or pharmacist if you have questions. COMMON BRAND NAME(S): Epogen, Procrit, Retacrit What should I tell my care team before I take this medication? They need to know if you have any of these conditions: Blood clots Cancer Heart disease High blood pressure On dialysis Seizures Stroke An unusual or allergic reaction to epoetin alfa, albumin, benzyl alcohol, other medications, foods, dyes, or preservatives Pregnant or trying to get pregnant Breast-feeding How should I use this medication? This medication is injected into a vein or under the skin. It is usually given by your care team in a hospital or clinic setting. It may also be given at home. If you get this medication at home, you will be taught how to prepare and give it. Use exactly as directed. Take it as directed on the prescription label at the same time every day. Keep taking it unless your care team tells you to stop. It is important that you put your used needles and syringes in a special sharps container. Do not put them in a trash can. If you do not have a sharps container, call your pharmacist or care team to get one. A special MedGuide will be given to you by the pharmacist with each prescription and refill. Be sure to read this information carefully each time. Talk to your care team about the use of this medication in children. While this medication may be used in children as young as 1 month of age for selected conditions, precautions do apply. Overdosage: If you think you have taken too much  of this medicine contact a poison control center or emergency room at once. NOTE: This medicine is only for you. Do not share this medicine with others. What if I miss a dose? If you miss a dose, take it as soon as you can. If it is almost time for your next dose, take only that dose. Do not take double or extra doses. What may interact with this medication? Darbepoetin alfa Methoxy polyethylene glycol-epoetin beta This list may not describe all possible interactions. Give your health care provider a list of all the medicines, herbs, non-prescription drugs, or dietary supplements you use. Also tell them if you smoke, drink alcohol, or use illegal drugs. Some items may interact with your medicine. What should I watch for while using this medication? Visit your care team for regular checks on your progress. Check your blood pressure as directed. Know what your blood pressure should be and when to contact your care team. Your condition will be monitored carefully while you are receiving this medication. You may need blood work while taking this medication. What side effects may I notice from receiving this medication? Side effects that you should report to your care team as soon as possible: Allergic reactions--skin rash, itching, hives, swelling of the face, lips, tongue, or throat Blood clot--pain, swelling, or warmth in the leg, shortness of breath, chest pain Heart attack--pain or tightness in the chest, shoulders, arms, or jaw, nausea, shortness of breath, cold or clammy skin, feeling faint or lightheaded Increase   in blood pressure Rash, fever, and swollen lymph nodes Redness, blistering, peeling, or loosening of the skin, including inside the mouth Seizures Stroke--sudden numbness or weakness of the face, arm, or leg, trouble speaking, confusion, trouble walking, loss of balance or coordination, dizziness, severe headache, change in vision Side effects that usually do not require medical  attention (report to your care team if they continue or are bothersome): Bone, joint, or muscle pain Cough Headache Nausea Pain, redness, or irritation at injection site This list may not describe all possible side effects. Call your doctor for medical advice about side effects. You may report side effects to FDA at 1-800-FDA-1088. Where should I keep my medication? Keep out of the reach of children and pets. Store in a refrigerator. Do not freeze. Do not shake. Protect from light. Keep this medication in the original container until you are ready to take it. See product for storage information. Get rid of any unused medication after the expiration date. To get rid of medications that are no longer needed or have expired: Take the medication to a medication take-back program. Check with your pharmacy or law enforcement to find a location. If you cannot return the medication, ask your pharmacist or care team how to get rid of the medication safely. NOTE: This sheet is a summary. It may not cover all possible information. If you have questions about this medicine, talk to your doctor, pharmacist, or health care provider.  2023 Elsevier/Gold Standard (2022-01-24 00:00:00)  

## 2023-01-15 LAB — IRON AND IRON BINDING CAPACITY (CC-WL,HP ONLY)
Iron: 32 ug/dL (ref 28–170)
Saturation Ratios: 12 % (ref 10.4–31.8)
TIBC: 263 ug/dL (ref 250–450)
UIBC: 231 ug/dL (ref 148–442)

## 2023-01-15 NOTE — Telephone Encounter (Signed)
Reaching out to [employer] to coordinate.

## 2023-01-17 ENCOUNTER — Encounter: Admit: 2023-01-17 | Discharge: 2023-01-17 | Payer: MEDICARE

## 2023-01-17 MED ORDER — PIOGLITAZONE 45 MG PO TAB
45 mg | ORAL_TABLET | Freq: Every day | ORAL | 1 refills | 90.00000 days | Status: AC
Start: 2023-01-17 — End: ?

## 2023-01-17 NOTE — Telephone Encounter
Received fax from [facility] requesting Pioglitazone 45 MG    Called patient to verify is she need medication no answer left voicemail for patient to call back

## 2023-01-18 ENCOUNTER — Ambulatory Visit: Payer: PRIVATE HEALTH INSURANCE

## 2023-01-18 ENCOUNTER — Encounter: Admit: 2023-01-18 | Discharge: 2023-01-18 | Payer: MEDICARE

## 2023-01-18 MED ORDER — DROPSAFE ALCOHOL PREP PADS TP PADM
11 refills | 100.00000 days | Status: AC
Start: 2023-01-18 — End: ?

## 2023-01-19 ENCOUNTER — Other Ambulatory Visit (INDEPENDENT_AMBULATORY_CARE_PROVIDER_SITE_OTHER): Payer: PRIVATE HEALTH INSURANCE | Admitting: Pharmacist

## 2023-01-19 ENCOUNTER — Encounter: Admit: 2023-01-19 | Discharge: 2023-01-19 | Payer: MEDICARE

## 2023-01-19 DIAGNOSIS — M810 Age-related osteoporosis without current pathological fracture: Secondary | ICD-10-CM

## 2023-01-19 DIAGNOSIS — S32010S Wedge compression fracture of first lumbar vertebra, sequela: Secondary | ICD-10-CM

## 2023-01-19 DIAGNOSIS — N1832 Stage 3b chronic kidney disease (HCC): Secondary | ICD-10-CM

## 2023-01-19 MED ORDER — AMLODIPINE 10 MG PO TAB
ORAL_TABLET | 1 refills | Status: AC
Start: 2023-01-19 — End: ?

## 2023-01-19 NOTE — Progress Notes (Signed)
01/19/2023 Name: [name] [name] MRN: [id number] DOB: 1956/05/08  Chief Complaint  Patient presents with   Medication Management    Evenity    [name] [name] is a 67 y.o. year old adult who was referred to Clinical Pharmacist Practitioner to assist with prior authorization for Evenity.    They were referred to the pharmacist by their PCP for assistance in managing medication access.   Subjective:   Medication Access/Adherence  Current Pharmacy:  [facility] [address] [place], [place] - [address] [place] [address] Phone: [phone] Fax: [phone]  [facility] [address] [place] [address] Phone: [phone] Fax: [phone]   Patient reports affordability concerns with their medications: Yes  Patient reports access/transportation concerns to their pharmacy: Yes  Patient reports adherence concerns with their medications:  No       Osteoporosis:  Current medications: Evenity - received doses 10/10/2022, 11/15/2022 and 12/19/2022 however now insurance is denying coverage.  They are recommending antiresportive therapy or daily Forteo injections (which also require prior authorization)   Previous medications - none  The BMD measured at Forearm Radius 33% is 0.593 g/cm2 with a T-score of -3.2. This patient is considered osteoporotic according to [facility]) criteria.  Lumbar spine was not utilized due to advanced degenerative changes. The scan quality is good.   DualFemur Neck Left 06/06/2022 66.3 years Low Bone Mass -1.9 0.774 g/cm2   Right Forearm Radius 33% 06/06/2022 66.3 Osteoporosis -3.2 0.593 g/cm2  Past fractures:  Rib fractures  Humerus and clavical 10/15/2022 Lumbar spine 1 - see imaging of lumbar spine from 01/22/2020 for notation of past fracture.    Current supplements: calcium and vitamin D  Current physical activity: none - patient in  wheel chair bound  Objective:  Lab Results  Component Value Date   HGBA1C 4.6 02/14/2022    Lab Results  Component Value Date   CREATININE 4.68 (H) 01/12/2023   BUN 66 (H) 01/12/2023   NA 136 01/12/2023   K 5.6 (H) 01/12/2023   CL 107 01/12/2023   CO2 20 (L) 01/12/2023    Lab Results  Component Value Date   CHOL 136 05/04/2022   HDL 28.60 (L) 05/04/2022   LDLCALC 78 05/04/2022   LDLDIRECT 115.8 01/23/2012   TRIG 147.0 05/04/2022   CHOLHDL 5 05/04/2022    Medications Reviewed Today     Reviewed by Cherre Robins, RPH-CPP (Pharmacist) on 01/19/23 at 1359  Med List Status: <None>   Medication Order Taking? Sig Documenting Provider Last Dose Status Informant  acetaminophen (TYLENOL) 325 MG tablet [id number] No Take 2 tablets (650 mg total) by mouth every 6 (six) hours as needed for mild pain, fever or headache (or Fever >/= 101). Discontinue 500/1000 mg dosing Little Ishikawa, MD Taking Active Spouse/Significant Other, Self, Mother, Father, Child, Family Member, Friend, Care Giver, EMS/Transport Team, Other, Nursing Home Medication Administration Guide (MAG), Pharmacy Records, Multiple Informants  albuterol (PROVENTIL) (2.5 MG/3ML) 0.083% nebulizer solution [id number] No Take 3 mLs (2.5 mg total) by nebulization every 6 (six) hours as needed for wheezing or shortness of breath. Ann Held, DO Taking Active Spouse/Significant Other  albuterol (VENTOLIN HFA) 108 (90 Base) MCG/ACT inhaler [id number] No Inhale 2 puffs into the lungs every 6 (six) hours as needed for wheezing or shortness of breath. [name] R, DO Taking Active Spouse/Significant Other  amLODipine (NORVASC) 5 MG tablet [id number] No Take  1 tablet (5 mg total) by mouth daily. Charlynne Cousins, MD Taking Active   apixaban [facility]) 2.5 MG TABS tablet [id number] No Take 1 tablet (2.5 mg total) by mouth 2 (two) times daily. Ann Held, DO Taking Active Spouse/Significant Other            Med Note (WHITE, Caroline More   Tue Jun 20, 2022 10:50 PM) Stopped taking due to possible surgery mentioned per MD, but PCP hasn't given the "okay" yet  atorvastatin (LIPITOR) 10 MG tablet [id number] No Take 1 tablet (10 mg total) by mouth daily. Little Ishikawa, MD Taking Active Spouse/Significant Other  carvedilol (COREG) 12.5 MG tablet [id number] No Take 1 tablet (12.5 mg total) by mouth daily. Ann Held, DO Taking Active   Continuous Blood Gluc Receiver (FREESTYLE LIBRE 14 DAY READER) DEVI [id number] No 1 Device by Does not apply route daily. Copland, Gay Filler, MD Taking Active Spouse/Significant Other  Continuous Blood Gluc Sensor (FREESTYLE LIBRE 14 DAY SENSOR) MISC [id number] No 1 Device by Does not apply route daily. Copland, Gay Filler, MD Taking Active Spouse/Significant Other  diazepam (VALIUM) 5 MG tablet [id number] No Take 1 tablet (5 mg total) by mouth every 12 (twelve) hours as needed for anxiety. Ann Held, DO Taking Active   DULoxetine (CYMBALTA) 60 MG capsule [id number] No Take 1 capsule (60 mg total) by mouth daily. Ann Held, DO Taking Active   ferrous sulfate 325 (65 FE) MG tablet [id number] No Take 1 tablet (325 mg total) by mouth 2 (two) times daily with a meal. Thurnell Lose, MD Taking Active Spouse/Significant Other  fluticasone-salmeterol (ADVAIR HFA) 115-21 MCG/ACT inhaler [id number] No Inhale 2 puffs into the lungs 2 (two) times daily.  Patient taking differently: Inhale 2 puffs into the lungs daily as needed (for shortness of breath).   Ann Held, DO Taking Active Spouse/Significant Other  folic acid (FOLVITE) 1 MG tablet [id number] No Take 2 tablets by mouth once daily Volanda Napoleon, MD Taking Active Spouse/Significant Other           Med Note Lodema Hong, COURTNEY A   Sun Jan 08, 2022 11:36 AM) Pt's husband reported that the pt has been out of this medication for roughly a week, and is in need of more refills.  furosemide  (LASIX) 20 MG tablet [id number] No Take 2 tablets (40 mg total) by mouth daily. Ann Held, DO Taking Active   HYDROcodone-acetaminophen (NORCO/VICODIN) 5-325 MG tablet [id number] No Take 1 tablet by mouth every 6 (six) hours as needed for moderate pain. [name] R, DO Taking Active   insulin glargine (LANTUS SOLOSTAR) 100 UNIT/ML Solostar Pen [id number] No INJECT 8 UNITS SUBCUTANEOUSLY once a day Carollee Herter, Alferd Apa, DO Taking Active   lubiprostone (AMITIZA) 24 MCG capsule [id number] No Take 1 capsule (24 mcg total) by mouth 2 (two) times daily with a meal.  Patient taking differently: Take 24 mcg by mouth daily as needed for constipation.   [name] R, DO Taking Active Spouse/Significant Other  Discontinued 01/10/12 2236 (Error)   montelukast (SINGULAIR) 10 MG tablet [id number] No Take 1 tablet (10 mg total) by mouth at bedtime. Ann Held, DO Taking Active Spouse/Significant Other  Multiple Vitamin (MULTIVITAMIN WITH MINERALS) TABS tablet [id number] No Take 1 tablet by mouth daily. Cathlyn Parsons, PA-C Taking Active Spouse/Significant Other  NONFORMULARY OR COMPOUNDED ITEM [id number] No Power wheelchair  #1  as  directed Ann Held, DO Taking Active Spouse/Significant Other  Discontinued 01/10/12 2236 (Error)   ondansetron (ZOFRAN) 8 MG tablet [id number] No TAKE 1/2 (ONE-HALF) TABLET BY MOUTH EVERY 8 HOURS AS NEEDED FOR NAUSEA FOR VOMITING Ann Held, DO Taking Active   pregabalin (LYRICA) 75 MG capsule [id number] No Take 1 capsule (75 mg total) by mouth 3 (three) times daily. [name] R, DO Taking Active   sodium bicarbonate 650 MG tablet [id number] No Take 1 tablet (650 mg total) by mouth 2 (two) times daily. Charlynne Cousins, MD Taking Active   tamsulosin [facility]) 0.4 MG CAPS capsule [id number] No Take 1 capsule (0.4 mg total) by mouth daily. Charlynne Cousins, MD Taking Active   VITAMIN D PO [id number] No Take 5,000  Units by mouth daily. [provider] Taking Active Spouse/Significant Other              Assessment/Plan:   Osteoporosis: - Reviewed recommendation for daily calcium intake of 1200 mg and vitamin D intake of [phone] units - contacted patient's Rx plan administrators - Rx Benefits. They provided criteria for prior authorization for Evenity which patient should meet given T-Score less than -2.5 and history of lumbar fracture.  - letter created for second appeal or Evenity. PCP to review and sign and will then sent to Rx Benefits.   - could consider either Forteo, Tymlos, Prolia or bisphosphonate if denied.     Cherre Robins, PharmD Clinical Pharmacist [facility]

## 2023-01-19 NOTE — Addendum Note (Signed)
Addended by: Cherre Robins B on: 01/19/2023 02:21 PM   Modules accepted: Orders

## 2023-01-19 NOTE — Telephone Encounter (Signed)
See additional notes in phone visit. Wrote letter of appeal to Rx benefits and faxed along with imaging for lumbar spine and DEXA.

## 2023-01-22 ENCOUNTER — Telehealth: Payer: Self-pay | Admitting: Family Medicine

## 2023-01-22 NOTE — Telephone Encounter (Signed)
FYI- Pt said she received a message about her evenity and she was told to confirm so that the medication can be taken out.

## 2023-01-22 NOTE — Telephone Encounter (Signed)
Tammy our pharmacist here typed up a second appeal letter that we faxed out today for her.

## 2023-01-22 NOTE — Telephone Encounter (Signed)
2nd appeal letter has been faxed.

## 2023-01-23 NOTE — Telephone Encounter (Signed)
Advised patient we still have not got approval for evenity and we will call her if we get approval.

## 2023-01-23 NOTE — Telephone Encounter (Signed)
Spoke with Ivin Booty, case worker for ins.  She stated that she will look into this as well.    For anything the future if patient needs help we can call her Bethanie Dicker at [phone].

## 2023-01-24 ENCOUNTER — Encounter: Admit: 2023-01-24 | Discharge: 2023-01-24 | Payer: MEDICARE

## 2023-01-24 ENCOUNTER — Ambulatory Visit: Admit: 2023-01-24 | Discharge: 2023-01-24 | Payer: MEDICARE

## 2023-01-24 DIAGNOSIS — N1832 Stage 3b chronic kidney disease (HCC): Secondary | ICD-10-CM

## 2023-01-24 LAB — CBC AND DIFF
ABSOLUTE BASO COUNT: 0 K/UL (ref 0–0.20)
ABSOLUTE EOS COUNT: 0.2 K/UL (ref 0–0.45)
ABSOLUTE LYMPH COUNT: 1.8 K/UL (ref 1.0–4.8)
ABSOLUTE MONO COUNT: 0.6 K/UL (ref 0–0.80)
ABSOLUTE NEUTROPHIL: 4.7 K/UL (ref 1.8–7.0)
BASOPHILS %: 1 % (ref 0–2)
EOSINOPHILS %: 4 % (ref 0–5)
HEMATOCRIT: 34 % — ABNORMAL LOW (ref 36–45)
LYMPHOCYTES %: 25 % (ref 24–44)
MCH: 28 pg (ref 26–34)
MONOCYTES %: 9 % (ref 4–12)
MPV: 8.3 FL (ref 7–11)
NEUTROPHILS %: 61 % (ref 41–77)
PLATELET COUNT: 289 K/UL (ref 150–400)
RBC COUNT: 4.1 M/UL (ref 4.0–5.0)
RDW: 15 % — ABNORMAL HIGH (ref 11–15)
WBC COUNT: 7.6 K/UL (ref 4.5–11.0)

## 2023-01-24 LAB — URINALYSIS DIPSTICK

## 2023-01-24 LAB — MICROALB/CR RATIO-URINE RANDOM
MICROALBUMIN, RAN: 22 ug/mL
UR CREATININE, RAN: 75 mg/dL

## 2023-01-24 LAB — URINALYSIS, MICROSCOPIC

## 2023-01-24 LAB — PROTEIN/CR RATIO,UR RAN
PROT CREAT RAT/CAL: 0.2 — ABNORMAL HIGH (ref <0.15)
UR CREATININE, RAN: 74 mg/dL (ref 5.0–8.0)
UR TOTAL PROTEIN,RAN: 14 mg/dL (ref 1.005–1.030)

## 2023-01-24 LAB — 25-OH VITAMIN D (D2 + D3): VITAMIN D (25-OH) TOTAL: 41 ng/mL — ABNORMAL LOW (ref 30–80)

## 2023-01-24 LAB — PARATHYROID HORMONE: PTH HORMONE: 160 pg/mL — ABNORMAL HIGH (ref 10–65)

## 2023-01-24 LAB — PHOSPHORUS: PHOSPHORUS: 3.3 mg/dL (ref 2.0–4.5)

## 2023-01-25 ENCOUNTER — Ambulatory Visit: Payer: PRIVATE HEALTH INSURANCE

## 2023-01-25 ENCOUNTER — Encounter: Payer: Self-pay | Admitting: Internal Medicine

## 2023-01-25 NOTE — Telephone Encounter (Signed)
[name] [name], case manager called and stated that they were suppose to make a decision today about our 2nd appeal.  I advised I have not received anything yet and I will be on the look out for it.  She stated that if they deny the appeal we can try to submit through medical insurance and can contact them at [phone] (utilization team)

## 2023-01-26 ENCOUNTER — Encounter: Admit: 2023-01-26 | Discharge: 2023-01-26 | Payer: MEDICARE

## 2023-01-26 ENCOUNTER — Ambulatory Visit: Admit: 2023-01-26 | Discharge: 2023-01-27 | Payer: MEDICARE

## 2023-01-26 ENCOUNTER — Ambulatory Visit: Admit: 2023-01-26 | Discharge: 2023-01-26 | Payer: MEDICARE

## 2023-01-26 DIAGNOSIS — I10 Essential (primary) hypertension: Secondary | ICD-10-CM

## 2023-01-26 DIAGNOSIS — G5600 Carpal tunnel syndrome, unspecified upper limb: Secondary | ICD-10-CM

## 2023-01-26 DIAGNOSIS — K5900 Constipation, unspecified: Secondary | ICD-10-CM

## 2023-01-26 DIAGNOSIS — E872 Metabolic acidosis: Secondary | ICD-10-CM

## 2023-01-26 DIAGNOSIS — N1832 Stage 3b chronic kidney disease (HCC): Secondary | ICD-10-CM

## 2023-01-26 DIAGNOSIS — J309 Allergic rhinitis, unspecified: Secondary | ICD-10-CM

## 2023-01-26 DIAGNOSIS — K589 Irritable bowel syndrome without diarrhea: Secondary | ICD-10-CM

## 2023-01-26 DIAGNOSIS — E119 Type 2 diabetes mellitus without complications: Secondary | ICD-10-CM

## 2023-01-26 DIAGNOSIS — J45909 Unspecified asthma, uncomplicated: Secondary | ICD-10-CM

## 2023-01-26 DIAGNOSIS — K2970 Gastritis, unspecified, without bleeding: Secondary | ICD-10-CM

## 2023-01-26 DIAGNOSIS — G9050 Complex regional pain syndrome I, unspecified: Secondary | ICD-10-CM

## 2023-01-26 DIAGNOSIS — E785 Hyperlipidemia, unspecified: Secondary | ICD-10-CM

## 2023-01-26 DIAGNOSIS — D649 Anemia, unspecified: Secondary | ICD-10-CM

## 2023-01-26 DIAGNOSIS — M79642 Pain in left hand: Secondary | ICD-10-CM

## 2023-01-26 DIAGNOSIS — E559 Vitamin D deficiency, unspecified: Secondary | ICD-10-CM

## 2023-01-26 LAB — BASIC METABOLIC PANEL
ANION GAP: 13 — ABNORMAL HIGH (ref 3–12)
BLD UREA NITROGEN: 25 mg/dL (ref 7–25)
CALCIUM: 9.5 mg/dL (ref 8.5–10.6)
CHLORIDE: 104 MMOL/L (ref 98–110)
CO2: 19 MMOL/L — ABNORMAL LOW (ref 21–30)
CREATININE: 1.9 mg/dL — ABNORMAL HIGH (ref 0.4–1.00)
EGFR: 29 mL/min — ABNORMAL LOW (ref >60)
GLUCOSE,PANEL: 312 mg/dL — ABNORMAL HIGH (ref 70–100)
POTASSIUM: 4.6 MMOL/L (ref 3.5–5.1)
SODIUM: 136 MMOL/L — ABNORMAL LOW (ref 137–147)

## 2023-01-26 NOTE — Telephone Encounter (Signed)
Patient Advocate Encounter  Appeal for [name] [name] has been approved.    PA# [id number] Effective dates: 01/24/23 through 10/26/23  Approval letter attached to charts
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
## 2023-01-26 NOTE — Patient Instructions
The following are some measures which may help to slow down the progression of kidney disease:      Maintain adequate fluid intake.    Acute illnesses like the flu and COVID which may end up in hospitalization may impact on  kidney function and could cause the kidneys to fail.    It is important to keep up with  regular vaccinations for these diseases including the pneumonia vaccine as well.    If you develop diarrhea or vomiting and  are unable to keep up with your fluid intake, please report to the nearest ED since it may affect blood pressure and affect the blood flow to the kidney.    Avoid medications known as  NSAIDs which  include Advil, Motrin,Aleve, ibuprofen, naproxen.    Some medications used in patients with kidney disease including diuretics [water pill], some blood pressure medications [ACE inhibitors and ARB] like lisinopril, losartan may need to be held if you develop severe diarrhea or vomiting.  Under these conditions these medications may worsen kidney function and therefore need to be held until diarhea/vomiting resolves. If you have to hold your blood pressure or water pill medication for any of these reasons, please inform the provider who prescribed the medication.    Some medications used for stomach acid suppression  known as Proton Pump Inhibitors (which include pantoprazole, omeprazole, lansoprazole, nexium, protonix, prilosec) have been associated with kidney disease through unknown mechanisms. Unless you have a compelling indication for any of these medications , consider switching to a different class of medications known as histamine-2 receptor blockers eg. famotidine, ranitidine  etc (your pharmacist or PCP can recommend a brand).    Nutrition management is a critical component in slowing kidney disease progression.  Visit the following web site for information on diet in patients with kidney disease:  [email or web address].kidney.org/nutrition

## 2023-01-27 DIAGNOSIS — N1832 Chronic kidney disease, stage 3b (HCC): Secondary | ICD-10-CM

## 2023-01-28 ENCOUNTER — Emergency Department (HOSPITAL_COMMUNITY): Payer: PRIVATE HEALTH INSURANCE

## 2023-01-28 ENCOUNTER — Encounter (HOSPITAL_COMMUNITY): Payer: Self-pay

## 2023-01-28 ENCOUNTER — Inpatient Hospital Stay (HOSPITAL_COMMUNITY)
Admission: EM | Admit: 2023-01-28 | Discharge: 2023-02-04 | DRG: 478 | Disposition: A | Discharge status: Home Health | Payer: PRIVATE HEALTH INSURANCE | Attending: Internal Medicine | Admitting: Internal Medicine

## 2023-01-28 DIAGNOSIS — E1140 Type 2 diabetes mellitus with diabetic neuropathy, unspecified: Secondary | ICD-10-CM | POA: Diagnosis not present

## 2023-01-28 DIAGNOSIS — E119 Type 2 diabetes mellitus without complications: Secondary | ICD-10-CM

## 2023-01-28 DIAGNOSIS — N179 Acute kidney failure, unspecified: Secondary | ICD-10-CM | POA: Diagnosis present

## 2023-01-28 DIAGNOSIS — Z89431 Acquired absence of right foot: Secondary | ICD-10-CM | POA: Diagnosis not present

## 2023-01-28 DIAGNOSIS — M4620 Osteomyelitis of vertebra, site unspecified: Secondary | ICD-10-CM

## 2023-01-28 DIAGNOSIS — I5032 Chronic diastolic (congestive) heart failure: Secondary | ICD-10-CM | POA: Diagnosis present

## 2023-01-28 DIAGNOSIS — E1169 Type 2 diabetes mellitus with other specified complication: Secondary | ICD-10-CM | POA: Diagnosis not present

## 2023-01-28 DIAGNOSIS — I132 Hypertensive heart and chronic kidney disease with heart failure and with stage 5 chronic kidney disease, or end stage renal disease: Secondary | ICD-10-CM | POA: Diagnosis not present

## 2023-01-28 DIAGNOSIS — E8722 Chronic metabolic acidosis: Secondary | ICD-10-CM | POA: Diagnosis present

## 2023-01-28 DIAGNOSIS — F32A Depression, unspecified: Secondary | ICD-10-CM | POA: Diagnosis not present

## 2023-01-28 DIAGNOSIS — M797 Fibromyalgia: Secondary | ICD-10-CM | POA: Diagnosis present

## 2023-01-28 DIAGNOSIS — R112 Nausea with vomiting, unspecified: Secondary | ICD-10-CM | POA: Diagnosis present

## 2023-01-28 DIAGNOSIS — Z841 Family history of disorders of kidney and ureter: Secondary | ICD-10-CM

## 2023-01-28 DIAGNOSIS — E11649 Type 2 diabetes mellitus with hypoglycemia without coma: Secondary | ICD-10-CM | POA: Diagnosis present

## 2023-01-28 DIAGNOSIS — D631 Anemia in chronic kidney disease: Secondary | ICD-10-CM | POA: Diagnosis present

## 2023-01-28 DIAGNOSIS — Z7984 Long term (current) use of oral hypoglycemic drugs: Secondary | ICD-10-CM

## 2023-01-28 DIAGNOSIS — M4644 Discitis, unspecified, thoracic region: Secondary | ICD-10-CM | POA: Diagnosis present

## 2023-01-28 DIAGNOSIS — K219 Gastro-esophageal reflux disease without esophagitis: Secondary | ICD-10-CM | POA: Diagnosis present

## 2023-01-28 DIAGNOSIS — Z7901 Long term (current) use of anticoagulants: Secondary | ICD-10-CM

## 2023-01-28 DIAGNOSIS — Z981 Arthrodesis status: Secondary | ICD-10-CM

## 2023-01-28 DIAGNOSIS — E7800 Pure hypercholesterolemia, unspecified: Secondary | ICD-10-CM | POA: Diagnosis not present

## 2023-01-28 DIAGNOSIS — E871 Hypo-osmolality and hyponatremia: Secondary | ICD-10-CM | POA: Diagnosis not present

## 2023-01-28 DIAGNOSIS — F4310 Post-traumatic stress disorder, unspecified: Secondary | ICD-10-CM | POA: Diagnosis present

## 2023-01-28 DIAGNOSIS — K7460 Unspecified cirrhosis of liver: Secondary | ICD-10-CM | POA: Diagnosis not present

## 2023-01-28 DIAGNOSIS — Z7951 Long term (current) use of inhaled steroids: Secondary | ICD-10-CM

## 2023-01-28 DIAGNOSIS — Z7409 Other reduced mobility: Secondary | ICD-10-CM | POA: Diagnosis present

## 2023-01-28 DIAGNOSIS — Z880 Allergy status to penicillin: Secondary | ICD-10-CM

## 2023-01-28 DIAGNOSIS — Z794 Long term (current) use of insulin: Secondary | ICD-10-CM

## 2023-01-28 DIAGNOSIS — E875 Hyperkalemia: Secondary | ICD-10-CM | POA: Diagnosis not present

## 2023-01-28 DIAGNOSIS — Z881 Allergy status to other antibiotic agents status: Secondary | ICD-10-CM

## 2023-01-28 DIAGNOSIS — I2510 Atherosclerotic heart disease of native coronary artery without angina pectoris: Secondary | ICD-10-CM | POA: Diagnosis present

## 2023-01-28 DIAGNOSIS — G894 Chronic pain syndrome: Secondary | ICD-10-CM

## 2023-01-28 DIAGNOSIS — M4624 Osteomyelitis of vertebra, thoracic region: Secondary | ICD-10-CM | POA: Diagnosis not present

## 2023-01-28 DIAGNOSIS — M8610 Other acute osteomyelitis, unspecified site: Secondary | ICD-10-CM

## 2023-01-28 DIAGNOSIS — I82403 Acute embolism and thrombosis of unspecified deep veins of lower extremity, bilateral: Secondary | ICD-10-CM

## 2023-01-28 DIAGNOSIS — Z86718 Personal history of other venous thrombosis and embolism: Secondary | ICD-10-CM

## 2023-01-28 DIAGNOSIS — R079 Chest pain, unspecified: Secondary | ICD-10-CM | POA: Diagnosis present

## 2023-01-28 DIAGNOSIS — E860 Dehydration: Secondary | ICD-10-CM | POA: Diagnosis present

## 2023-01-28 DIAGNOSIS — Z79899 Other long term (current) drug therapy: Secondary | ICD-10-CM

## 2023-01-28 DIAGNOSIS — Z886 Allergy status to analgesic agent status: Secondary | ICD-10-CM

## 2023-01-28 DIAGNOSIS — I10 Essential (primary) hypertension: Secondary | ICD-10-CM | POA: Diagnosis present

## 2023-01-28 DIAGNOSIS — Z8249 Family history of ischemic heart disease and other diseases of the circulatory system: Secondary | ICD-10-CM

## 2023-01-28 DIAGNOSIS — E11319 Type 2 diabetes mellitus with unspecified diabetic retinopathy without macular edema: Secondary | ICD-10-CM | POA: Diagnosis present

## 2023-01-28 DIAGNOSIS — R0902 Hypoxemia: Secondary | ICD-10-CM | POA: Diagnosis not present

## 2023-01-28 DIAGNOSIS — Z818 Family history of other mental and behavioral disorders: Secondary | ICD-10-CM

## 2023-01-28 DIAGNOSIS — I252 Old myocardial infarction: Secondary | ICD-10-CM

## 2023-01-28 DIAGNOSIS — H548 Legal blindness, as defined in USA: Secondary | ICD-10-CM | POA: Diagnosis present

## 2023-01-28 DIAGNOSIS — Z91199 Patient's noncompliance with other medical treatment and regimen due to unspecified reason: Secondary | ICD-10-CM

## 2023-01-28 DIAGNOSIS — Z993 Dependence on wheelchair: Secondary | ICD-10-CM

## 2023-01-28 DIAGNOSIS — Z833 Family history of diabetes mellitus: Secondary | ICD-10-CM

## 2023-01-28 DIAGNOSIS — J9811 Atelectasis: Secondary | ICD-10-CM | POA: Diagnosis not present

## 2023-01-28 DIAGNOSIS — Z9049 Acquired absence of other specified parts of digestive tract: Secondary | ICD-10-CM

## 2023-01-28 DIAGNOSIS — E1122 Type 2 diabetes mellitus with diabetic chronic kidney disease: Secondary | ICD-10-CM | POA: Diagnosis present

## 2023-01-28 DIAGNOSIS — Z806 Family history of leukemia: Secondary | ICD-10-CM

## 2023-01-28 DIAGNOSIS — I82503 Chronic embolism and thrombosis of unspecified deep veins of lower extremity, bilateral: Secondary | ICD-10-CM | POA: Diagnosis present

## 2023-01-28 DIAGNOSIS — N185 Chronic kidney disease, stage 5: Secondary | ICD-10-CM | POA: Diagnosis present

## 2023-01-28 DIAGNOSIS — Z6839 Body mass index (BMI) 39.0-39.9, adult: Secondary | ICD-10-CM

## 2023-01-28 DIAGNOSIS — M869 Osteomyelitis, unspecified: Secondary | ICD-10-CM

## 2023-01-28 LAB — D-DIMER, QUANTITATIVE: D-Dimer, Quant: 2.08 ug/mL-FEU — ABNORMAL HIGH (ref 0.00–0.50)

## 2023-01-28 LAB — CBC
HCT: 28.6 % — ABNORMAL LOW (ref 36.0–46.0)
Hemoglobin: 8.6 g/dL — ABNORMAL LOW (ref 12.0–15.0)
MCH: 27.8 pg (ref 26.0–34.0)
MCHC: 30.1 g/dL (ref 30.0–36.0)
MCV: 92.6 fL (ref 80.0–100.0)
Platelets: 177 10*3/uL (ref 150–400)
RBC: 3.09 MIL/uL — ABNORMAL LOW (ref 3.87–5.11)
RDW: 15.4 % (ref 11.5–15.5)
WBC: 6.5 10*3/uL (ref 4.0–10.5)
nRBC: 0 % (ref 0.0–0.2)

## 2023-01-28 LAB — TROPONIN I (HIGH SENSITIVITY)
Troponin I (High Sensitivity): 9 ng/L (ref <18)
Troponin I (High Sensitivity): 9 ng/L (ref <18)

## 2023-01-28 LAB — BASIC METABOLIC PANEL
Anion gap: 7 (ref 5–15)
BUN: 67 mg/dL — ABNORMAL HIGH (ref 8–23)
CO2: 18 mmol/L — ABNORMAL LOW (ref 22–32)
Calcium: 8.1 mg/dL — ABNORMAL LOW (ref 8.9–10.3)
Chloride: 112 mmol/L — ABNORMAL HIGH (ref 98–111)
Creatinine, Ser: 4.24 mg/dL — ABNORMAL HIGH (ref 0.44–1.00)
GFR, Estimated: 11 mL/min — ABNORMAL LOW (ref >60)
Glucose, Bld: 86 mg/dL (ref 70–99)
Potassium: 5.6 mmol/L — ABNORMAL HIGH (ref 3.5–5.1)
Sodium: 137 mmol/L (ref 135–145)

## 2023-01-28 MED ORDER — HYDROMORPHONE HCL 1 MG/ML IJ SOLN
1.0000 mg | INTRAMUSCULAR | Status: DC | PRN
  Administered 2023-01-29 – 2023-02-04 (×15): 1 mg via INTRAVENOUS
  Filled 2023-01-28 (×15): qty 1

## 2023-01-28 MED ORDER — HYDROMORPHONE HCL 1 MG/ML IJ SOLN
1.0000 mg | Freq: Once | INTRAMUSCULAR | Status: AC
  Administered 2023-01-28: 1 mg via INTRAVENOUS
  Filled 2023-01-28: qty 1

## 2023-01-28 MED ORDER — INSULIN ASPART 100 UNIT/ML IJ SOLN
5.0000 [IU] | Freq: Once | INTRAMUSCULAR | Status: DC
  Filled 2023-01-28: qty 0.05

## 2023-01-28 MED ORDER — LORAZEPAM 1 MG PO TABS: 1.0000 mg | ORAL_TABLET | Freq: Once | ORAL | Status: DC

## 2023-01-28 MED ORDER — DEXTROSE 50 % IV SOLN
1.0000 | Freq: Once | INTRAVENOUS | Status: AC
  Administered 2023-01-29: 50 mL via INTRAVENOUS
  Filled 2023-01-28: qty 50

## 2023-01-28 MED ORDER — INSULIN ASPART 100 UNIT/ML IJ SOLN
0.0000 [IU] | INTRAMUSCULAR | Status: DC
  Filled 2023-01-28: qty 0.09

## 2023-01-28 MED ORDER — LORAZEPAM 2 MG/ML IJ SOLN
1.0000 mg | Freq: Once | INTRAMUSCULAR | Status: AC
  Administered 2023-01-28: 1 mg via INTRAVENOUS
  Filled 2023-01-28: qty 1

## 2023-01-28 MED ORDER — FENTANYL CITRATE PF 50 MCG/ML IJ SOSY
50.0000 ug | PREFILLED_SYRINGE | Freq: Once | INTRAMUSCULAR | Status: AC
  Administered 2023-01-28: 50 ug via INTRAVENOUS
  Filled 2023-01-28: qty 1

## 2023-01-28 MED ORDER — VANCOMYCIN HCL 1500 MG/300ML IV SOLN
1500.0000 mg | Freq: Once | INTRAVENOUS | Status: DC
  Filled 2023-01-28: qty 300

## 2023-01-28 MED ORDER — SODIUM CHLORIDE 0.9 % IV SOLN
2.0000 g | Freq: Once | INTRAVENOUS | Status: DC
  Filled 2023-01-28: qty 20

## 2023-01-28 MED ORDER — ONDANSETRON 4 MG PO TBDP: 4.0000 mg | ORAL_TABLET | Freq: Once | ORAL | Status: DC

## 2023-01-28 MED ORDER — NALOXONE HCL 0.4 MG/ML IJ SOLN: 0.4000 mg | INTRAMUSCULAR | Status: DC | PRN

## 2023-01-28 MED ORDER — ONDANSETRON HCL 4 MG/2ML IJ SOLN
4.0000 mg | Freq: Once | INTRAMUSCULAR | Status: AC
  Administered 2023-01-28: 4 mg via INTRAVENOUS
  Filled 2023-01-28: qty 2

## 2023-01-28 MED ORDER — SODIUM CHLORIDE 0.9 % IV SOLN: INTRAVENOUS | Status: DC
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
## 2023-01-28 NOTE — ED Notes (Signed)
[facility] called for patient transportation to [facility].

## 2023-01-28 NOTE — Consult Note (Signed)
Full consult note to follow, pt of Dr. Marcello Moores' with prior discitis and PSIF, now with worsening back pain, CT concerning for discitis / osteomyelitis above her prior construct, also with pseudoarthrosis. MRI pending  -will need [facility] eval / treatment under the medicine service -likely will need disc space biopsy if blood cultures are negative -hold antibiotics for possible disc space biopsy, if blood cultures provide an organism then start treatment -will let Dr. Marcello Moores know, who will see the patient tomorrow. Unless the MRI shows a large abscess, likely will be non-operative management of the discitis

## 2023-01-28 NOTE — H&P (Signed)
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 clinical deterioration. However, it is anticipated that the patient may be medically stable for discharge from the hospital within 24 to 48 hours.   Shela Leff MD [facility]  If 7PM-7AM, please contact night-coverage [email or web address]  01/28/2023, 10:32 PM

## 2023-01-28 NOTE — ED Triage Notes (Signed)
Pt states pain from left scapula area to spine, radiating down her back x2 days. Pt states that it feels like a bumblebee in her chest.  Back surgery August 2023. Emesis x 3 times, endorses nausea now.   Pt states this feels similar to last time, which a virus damaged the cartilage in her back, which made her have the surgery.

## 2023-01-28 NOTE — ED Notes (Signed)
Spoke with pt's husband on the phone and update provided. Room number at [facility] provided and aware that pt will be transferred there sometime tonight. Husband states no further questions.

## 2023-01-29 ENCOUNTER — Other Ambulatory Visit: Payer: Self-pay

## 2023-01-29 ENCOUNTER — Ambulatory Visit: Payer: PRIVATE HEALTH INSURANCE | Admitting: Family Medicine

## 2023-01-29 DIAGNOSIS — E1122 Type 2 diabetes mellitus with diabetic chronic kidney disease: Secondary | ICD-10-CM | POA: Diagnosis present

## 2023-01-29 DIAGNOSIS — E875 Hyperkalemia: Secondary | ICD-10-CM | POA: Diagnosis present

## 2023-01-29 DIAGNOSIS — M4624 Osteomyelitis of vertebra, thoracic region: Secondary | ICD-10-CM | POA: Diagnosis not present

## 2023-01-29 DIAGNOSIS — Z89431 Acquired absence of right foot: Secondary | ICD-10-CM | POA: Diagnosis not present

## 2023-01-29 DIAGNOSIS — N185 Chronic kidney disease, stage 5: Secondary | ICD-10-CM | POA: Diagnosis present

## 2023-01-29 DIAGNOSIS — E1169 Type 2 diabetes mellitus with other specified complication: Secondary | ICD-10-CM | POA: Diagnosis present

## 2023-01-29 DIAGNOSIS — E8722 Chronic metabolic acidosis: Secondary | ICD-10-CM | POA: Diagnosis present

## 2023-01-29 DIAGNOSIS — I5032 Chronic diastolic (congestive) heart failure: Secondary | ICD-10-CM | POA: Diagnosis present

## 2023-01-29 DIAGNOSIS — E11649 Type 2 diabetes mellitus with hypoglycemia without coma: Secondary | ICD-10-CM | POA: Diagnosis present

## 2023-01-29 DIAGNOSIS — M4644 Discitis, unspecified, thoracic region: Secondary | ICD-10-CM | POA: Diagnosis present

## 2023-01-29 DIAGNOSIS — I132 Hypertensive heart and chronic kidney disease with heart failure and with stage 5 chronic kidney disease, or end stage renal disease: Secondary | ICD-10-CM | POA: Diagnosis present

## 2023-01-29 DIAGNOSIS — D631 Anemia in chronic kidney disease: Secondary | ICD-10-CM | POA: Diagnosis present

## 2023-01-29 DIAGNOSIS — E871 Hypo-osmolality and hyponatremia: Secondary | ICD-10-CM | POA: Diagnosis present

## 2023-01-29 DIAGNOSIS — E860 Dehydration: Secondary | ICD-10-CM | POA: Diagnosis present

## 2023-01-29 DIAGNOSIS — M4620 Osteomyelitis of vertebra, site unspecified: Secondary | ICD-10-CM | POA: Diagnosis not present

## 2023-01-29 DIAGNOSIS — N179 Acute kidney failure, unspecified: Secondary | ICD-10-CM | POA: Diagnosis present

## 2023-01-29 DIAGNOSIS — E1140 Type 2 diabetes mellitus with diabetic neuropathy, unspecified: Secondary | ICD-10-CM | POA: Diagnosis present

## 2023-01-29 DIAGNOSIS — E11319 Type 2 diabetes mellitus with unspecified diabetic retinopathy without macular edema: Secondary | ICD-10-CM | POA: Diagnosis present

## 2023-01-29 DIAGNOSIS — J9811 Atelectasis: Secondary | ICD-10-CM | POA: Diagnosis not present

## 2023-01-29 DIAGNOSIS — Z794 Long term (current) use of insulin: Secondary | ICD-10-CM | POA: Diagnosis not present

## 2023-01-29 DIAGNOSIS — E7800 Pure hypercholesterolemia, unspecified: Secondary | ICD-10-CM | POA: Diagnosis present

## 2023-01-29 DIAGNOSIS — K7460 Unspecified cirrhosis of liver: Secondary | ICD-10-CM | POA: Diagnosis present

## 2023-01-29 DIAGNOSIS — F32A Depression, unspecified: Secondary | ICD-10-CM | POA: Diagnosis present

## 2023-01-29 LAB — BASIC METABOLIC PANEL
Anion gap: 10 (ref 5–15)
BUN: 28 mg/dL — ABNORMAL HIGH (ref 8–23)
CO2: 16 mmol/L — ABNORMAL LOW (ref 22–32)
Calcium: 9 mg/dL (ref 8.9–10.3)
Chloride: 98 mmol/L (ref 98–111)
Creatinine, Ser: 0.88 mg/dL (ref 0.44–1.00)
GFR, Estimated: >60 mL/min (ref >60)
Glucose, Bld: 98 mg/dL (ref 70–99)
Potassium: 4.7 mmol/L (ref 3.5–5.1)
Sodium: 124 mmol/L — ABNORMAL LOW (ref 135–145)

## 2023-01-29 LAB — GLUCOSE, CAPILLARY
Glucose-Capillary: 77 mg/dL (ref 70–99)
Glucose-Capillary: 71 mg/dL (ref 70–99)
Glucose-Capillary: 100 mg/dL — ABNORMAL HIGH (ref 70–99)
Glucose-Capillary: 90 mg/dL (ref 70–99)
Glucose-Capillary: 115 mg/dL — ABNORMAL HIGH (ref 70–99)
Glucose-Capillary: 89 mg/dL (ref 70–99)

## 2023-01-29 LAB — HEPATIC FUNCTION PANEL
ALT: 6 U/L (ref 0–44)
AST: 10 U/L — ABNORMAL LOW (ref 15–41)
Albumin: 2.9 g/dL — ABNORMAL LOW (ref 3.5–5.0)
Alkaline Phosphatase: 131 U/L — ABNORMAL HIGH (ref 38–126)
Bilirubin, Direct: 0.1 mg/dL (ref 0.0–0.2)
Indirect Bilirubin: 0.5 mg/dL (ref 0.3–0.9)
Total Bilirubin: 0.6 mg/dL (ref 0.3–1.2)
Total Protein: 7.4 g/dL (ref 6.5–8.1)

## 2023-01-29 LAB — CBG MONITORING, ED
Glucose-Capillary: 168 mg/dL — ABNORMAL HIGH (ref 70–99)
Glucose-Capillary: 68 mg/dL — ABNORMAL LOW (ref 70–99)

## 2023-01-29 LAB — CBC
HCT: 28.2 % — ABNORMAL LOW (ref 36.0–46.0)
Hemoglobin: 8.2 g/dL — ABNORMAL LOW (ref 12.0–15.0)
MCH: 27.6 pg (ref 26.0–34.0)
MCHC: 29.1 g/dL — ABNORMAL LOW (ref 30.0–36.0)
MCV: 94.9 fL (ref 80.0–100.0)
Platelets: 164 10*3/uL (ref 150–400)
RBC: 2.97 MIL/uL — ABNORMAL LOW (ref 3.87–5.11)
RDW: 15.3 % (ref 11.5–15.5)
WBC: 7.3 10*3/uL (ref 4.0–10.5)
nRBC: 0 % (ref 0.0–0.2)

## 2023-01-29 LAB — HIV ANTIBODY (ROUTINE TESTING W REFLEX): HIV Screen 4th Generation wRfx: NONREACTIVE

## 2023-01-29 LAB — MRSA NEXT GEN BY PCR, NASAL: MRSA by PCR Next Gen: NOT DETECTED

## 2023-01-29 LAB — BRAIN NATRIURETIC PEPTIDE
B Natriuretic Peptide: 162.6 pg/mL — ABNORMAL HIGH (ref 0.0–100.0)
B Natriuretic Peptide: 171 pg/mL — ABNORMAL HIGH (ref 0.0–100.0)

## 2023-01-29 LAB — MAGNESIUM: Magnesium: 1.6 mg/dL — ABNORMAL LOW (ref 1.7–2.4)

## 2023-01-29 LAB — LIPASE, BLOOD: Lipase: 48 U/L (ref 11–51)

## 2023-01-29 MED ORDER — MAGNESIUM SULFATE 2 GM/50ML IV SOLN
2.0000 g | Freq: Once | INTRAVENOUS | Status: AC
  Administered 2023-01-29: 2 g via INTRAVENOUS
  Filled 2023-01-29: qty 50

## 2023-01-29 MED ORDER — DULOXETINE HCL 60 MG PO CPEP
60.0000 mg | ORAL_CAPSULE | Freq: Every day | ORAL | Status: DC
  Administered 2023-01-29 – 2023-02-04 (×7): 60 mg via ORAL
  Filled 2023-01-29 (×7): qty 1

## 2023-01-29 MED ORDER — ALBUTEROL SULFATE HFA 108 (90 BASE) MCG/ACT IN AERS: 2.0000 | INHALATION_SPRAY | Freq: Four times a day (QID) | RESPIRATORY_TRACT | Status: DC | PRN

## 2023-01-29 MED ORDER — CARVEDILOL 12.5 MG PO TABS
12.5000 mg | ORAL_TABLET | Freq: Every day | ORAL | Status: DC
  Administered 2023-01-29 – 2023-01-31 (×3): 12.5 mg via ORAL
  Filled 2023-01-29 (×3): qty 1

## 2023-01-29 MED ORDER — OXYCODONE HCL 5 MG PO TABS
5.0000 mg | ORAL_TABLET | ORAL | Status: DC | PRN
  Administered 2023-01-29 – 2023-02-04 (×16): 10 mg via ORAL
  Filled 2023-01-29 (×16): qty 2

## 2023-01-29 MED ORDER — ONDANSETRON HCL 4 MG/2ML IJ SOLN
4.0000 mg | Freq: Three times a day (TID) | INTRAMUSCULAR | Status: AC | PRN
  Administered 2023-01-29: 4 mg via INTRAVENOUS
  Filled 2023-01-29: qty 2

## 2023-01-29 MED ORDER — DEXTROSE-NACL 5-0.45 % IV SOLN: INTRAVENOUS | Status: AC

## 2023-01-29 MED ORDER — ACETAMINOPHEN 325 MG PO TABS
650.0000 mg | ORAL_TABLET | Freq: Four times a day (QID) | ORAL | Status: DC | PRN
  Administered 2023-01-29 – 2023-02-04 (×4): 650 mg via ORAL
  Filled 2023-01-29 (×4): qty 2

## 2023-01-29 MED ORDER — ALBUTEROL SULFATE (2.5 MG/3ML) 0.083% IN NEBU: 2.5000 mg | INHALATION_SOLUTION | Freq: Four times a day (QID) | RESPIRATORY_TRACT | Status: DC | PRN

## 2023-01-29 NOTE — Inpatient Diabetes Management (Signed)
Inpatient Diabetes Program Recommendations  AACE/ADA: New Consensus Statement on Inpatient Glycemic Control (2015)  Target Ranges:  Prepandial:   less than 140 mg/dL      Peak postprandial:   less than 180 mg/dL (1-2 hours)      Critically ill patients:  140 - 180 mg/dL   Lab Results  Component Value Date   GLUCAP 71 01/29/2023   HGBA1C 4.6 02/14/2022    Review of Glycemic Control  Latest Reference Range & Units 01/29/23 00:32 01/29/23 05:09 01/29/23 08:03  Glucose-Capillary 70 - 99 mg/dL 168 (H) 77 71  (H): Data is abnormally high Diabetes history:  Type 2 DM Outpatient Diabetes medications: Lantus 8 units QD Current orders for Inpatient glycemic control: Novolog 0-9 units Q4H  Inpatient Diabetes Program Recommendations:    Consider reducing correction to Novolog 0-6 units Q4H.   Thanks, Bronson Curb, MSN, RNC-OB Diabetes Coordinator [phone] (8a-5p)

## 2023-01-29 NOTE — Progress Notes (Signed)
PROGRESS NOTE  [name] [name] [id number] DOB: 1956/01/28 DOA: 01/28/2023 PCP: Ann Held, DO   HPI/Recap of past 24 hours: [name] [name] is a 67 y.o. 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 Admitted in August 2023 for T10-T11 discitis osteomyelitis s/p posterior arthrodesis T8-T11 and removal of T11 pedicle screws and rod removal.  Patient presents to the ED for progressively worsening upper/mid back pain as well as chest pain.  Reports temp of 103 at home.  VS here in the ED fairly stable. Labs fairly stable at baseline, troponin negative x 2, D-dimer 2.08 (was 2.32 on labs done 6 weeks ago), blood cultures drawn.  Chest x-ray negative for acute finding. Neurosurgery consulted and recommended the need for disc space biopsy if blood cultures are negative, holding antibiotics, but if blood cultures positive then start treatment.  Admitted by [facility] for further management.    Today, patient continues to complain of significant back pain, nausea, denies any more chest pain.  No noted fever so far.  Blood culture with no growth so far.  Companion/friend at bedside.  Reported patient is wheelchair-bound but is able to transfer independently.     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  If blood cultures positive, then start  antibiotics Continue pain management   Atypical chest pain History of CAD Chest pain likely related to problem listed above Troponin negative x 2 and not consistent with ACS D-dimer slightly elevated in the setting of chronic DVT with hx of osteomyelitis, but stable since labs done 6 weeks ago  Chronic DVT Hold Eliquis at this time in anticipation of possible disc space biopsy (reported not taking)   CKD stage V Metabolic acidosis Creatinine on 4/1--> ??0.8, very unlikely for this patient with CKD stage V Repeat BMP in a.m.  Hyponatremia Unreliable BMP Repeat BMP in the am  Hypomagnesemia Replace as needed  Chronic normocytic anemia Anemia of chronic kidney disease Hemoglobin stable Daily CBC   Chronic HFpEF BNP 171 EF 65 to 70% on echo done in August 2023 No signs of volume overload Monitor closely   Insulin-dependent type 2 diabetes Last A1c 4.6 in April 2023 Had an hypoglycemic episode on 3/31 SSI, Accu-Cheks, hypoglycemic protocol   Hypertension Continue Coreg   Obesity Lifestyle modification advised      Estimated body mass index is 36.56 kg/m as calculated from the following:   Height as of this encounter: 4\' 11"  (1.499 m).   Weight as of this encounter: 82.1 kg.     Code Status: Full  Family Communication: None at bedside  Disposition Plan: Status is: Inpatient Remains inpatient appropriate because: Level of care      Consultants: Neurosurgery  Procedures: None  Antimicrobials: None  DVT prophylaxis: SCDs   Objective: Vitals:   01/29/23 0739 01/29/23 0810 01/29/23 1124 01/29/23 1456  BP: (!) 162/66 (!) 146/62 (!) 143/63 [name] Kitchen)  171/72  Pulse: 85 76 76 78  Resp: 15 12 16 17   Temp: 98.2 F (36.8 C)  98 F (36.7 C) 97.7 F (36.5 C)  TempSrc: Axillary  Axillary Oral  SpO2: 98% 98% 95% 92%  Weight:      Height:        Intake/Output Summary (Last 24 hours) at 01/29/2023 1602 Last data filed at 01/29/2023 1535 Gross per 24 hour   Intake 1155 ml  Output 1000 ml  Net 155 ml   Filed Weights   01/28/23 1450 01/29/23 0116  Weight: 88.5 kg 82.1 kg    Exam: General: NAD  Cardiovascular: S1, S2 present Respiratory: CTAB Abdomen: Soft, nontender, nondistended, bowel sounds present Musculoskeletal: No bilateral pedal edema noted Skin: Normal Psychiatry: Normal mood     Data Reviewed: CBC: Recent Labs  Lab 01/28/23 1539 01/29/23 0025  WBC 6.5 7.3  HGB 8.6* 8.2*  HCT 28.6* 28.2*  MCV 92.6 94.9  PLT 177 [id number]   Basic Metabolic Panel: Recent Labs  Lab 01/28/23 1539 01/29/23 0025 01/29/23 0327  NA 137 124*  --   K 5.6* 4.7  --   CL 112* 98  --   CO2 18* 16*  --   GLUCOSE 86 98  --   BUN 67* 28*  --   CREATININE 4.24* 0.88  --   CALCIUM 8.1* 9.0  --   MG  --   --  1.6*   GFR: Estimated Creatinine Clearance (by C-G formula based on SCr of 0.88 mg/dL) Female: 57.6 mL/min Female: 70.9 mL/min Liver Function Tests: Recent Labs  Lab 01/29/23 0025  AST 10*  ALT 6  ALKPHOS 131*  BILITOT 0.6  PROT 7.4  ALBUMIN 2.9*   Recent Labs  Lab 01/29/23 0025  LIPASE 48   No results for input(s): "AMMONIA" in the last 168 hours. Coagulation Profile: No results for input(s): "INR", "PROTIME" in the last 168 hours. Cardiac Enzymes: No results for input(s): "CKTOTAL", "CKMB", "CKMBINDEX", "TROPONINI" in the last 168 hours. BNP (last 3 results) No results for input(s): "PROBNP" in the last 8760 hours. HbA1C: No results for input(s): "HGBA1C" in the last 72 hours. CBG: Recent Labs  Lab 01/28/23 2359 01/29/23 0032 01/29/23 0509 01/29/23 0803 01/29/23 1124  GLUCAP 68* 168* 77 71 100*   Lipid Profile: No results for input(s): "CHOL", "HDL", "LDLCALC", "TRIG", "CHOLHDL", "LDLDIRECT" in the last 72 hours. Thyroid Function Tests: No results for input(s): "TSH", "T4TOTAL", "FREET4", "T3FREE", "THYROIDAB" in the last 72 hours. Anemia Panel: No results for input(s): "VITAMINB12", "FOLATE", "FERRITIN",  "TIBC", "IRON", "RETICCTPCT" in the last 72 hours. Urine analysis:    Component Value Date/Time   COLORURINE YELLOW 06/20/2022 1900   APPEARANCEUR CLOUDY (A) 06/20/2022 1900   LABSPEC 1.013 06/20/2022 1900   PHURINE 5.0 06/20/2022 1900   GLUCOSEU 50 (A) 06/20/2022 1900   GLUCOSEU 500 (A) 12/03/2019 1309   HGBUR MODERATE (A) 06/20/2022 1900   HGBUR moderate 08/05/2010 1048   BILIRUBINUR NEGATIVE 06/20/2022 1900   BILIRUBINUR neg 11/15/2017 1241   KETONESUR NEGATIVE 06/20/2022 1900   PROTEINUR 100 (A) 06/20/2022 1900   UROBILINOGEN >=8.0 (A) 12/03/2019 1309   NITRITE NEGATIVE 06/20/2022 1900   LEUKOCYTESUR LARGE (A) 06/20/2022 1900   Sepsis Labs: @LABRCNTIP (procalcitonin:4,lacticidven:4)  ) Recent Results (from the past 240 hour(s))  Blood culture (routine x 2)     Status: None (Preliminary result)   Collection Time: 01/28/23  9:00 PM   Specimen: BLOOD  Result Value Ref Range Status  Specimen Description   Final    BLOOD SITE NOT SPECIFIED Performed at [facility] [address], [place], [place] [address]    Special Requests   Final    BOTTLES DRAWN AEROBIC ONLY Blood Culture results may not be optimal due to an inadequate volume of blood received in culture bottles Performed at [facility] [address], [place], [place] [address]    Culture   Final    NO GROWTH < 12 HOURS Performed at [facility] [address] [place], [place] [address]    Report Status PENDING  Incomplete  Blood culture (routine x 2)     Status: None (Preliminary result)   Collection Time: 01/28/23  9:10 PM   Specimen: BLOOD  Result Value Ref Range Status   Specimen Description   Final    BLOOD SITE NOT SPECIFIED Performed at [facility] [address] [place], [place] [address]    Special Requests   Final    BOTTLES DRAWN AEROBIC AND ANAEROBIC Blood Culture adequate volume Performed at [facility] [address],  [place], [place] [address]    Culture   Final    NO GROWTH < 12 HOURS Performed at [facility] [address] [place], [place] [address]    Report Status PENDING  Incomplete  MRSA Next Gen by PCR, Nasal     Status: None   Collection Time: 01/29/23  3:54 AM   Specimen: Nasal Mucosa; Nasal Swab  Result Value Ref Range Status   MRSA by PCR Next Gen NOT DETECTED NOT DETECTED Final    Comment: (NOTE) The GeneXpert MRSA Assay (FDA approved for NASAL specimens only), is one component of a comprehensive MRSA colonization surveillance program. It is not intended to diagnose MRSA infection nor to guide or monitor treatment for MRSA infections. Test performance is not FDA approved in patients less than 74 years old. Performed at [facility], [place] [address] [place], [place] [address]       Studies: MR THORACIC SPINE WO CONTRAST  Result Date: 01/28/2023 CLINICAL DATA:  Upper back pain. Progressive discitis osteomyelitis T4-5 EXAM: MRI THORACIC SPINE WITHOUT CONTRAST TECHNIQUE: Multiplanar, multisequence MR imaging of the thoracic spine was performed. No intravenous contrast was administered. COMPARISON:  CT same day FINDINGS: Alignment:  No malalignment. Vertebrae: Distant fusion from T8 through L3. Progressive findings of discitis osteomyelitis at the T4-5 level with worsened endplate destruction and mild surrounding soft tissue edematous change. Mild encroachment upon the spinal canal and neural foramina. No evidence of cord compression. Imaging improvement at the T10-11 level, with less T2 bright material. No significant encroachment upon the canal. Mild foraminal narrowing. Cord:  No primary cord finding. Paraspinal and other soft tissues: Otherwise negative Disc levels: Uninvolved disc levels appear unremarkable with wide patency of the canal and foramina. IMPRESSION: 1. Progressive findings of discitis osteomyelitis at the T4-5 level compared with August of 2023, with worsened endplate  destruction and mild surrounding soft tissue edematous change. Mild encroachment upon the spinal canal and neural foramina but no evidence of cord compression. 2. Imaging improvement at the T10-11 level, with less T2 bright material. No significant encroachment upon the canal. Mild foraminal narrowing. 3. Distant fusion from T8 through L3. Wide patency of the canal and foramina at those levels. Electronically Signed   By: Nelson Chimes M.D.   On: 01/28/2023 20:25   CT Thoracic Spine Wo Contrast  Result Date: 01/28/2023 CLINICAL DATA:  Upper back pain. EXAM: CT THORACIC SPINE WITHOUT CONTRAST TECHNIQUE: Multidetector CT images of the thoracic were obtained using the standard protocol without intravenous contrast. RADIATION DOSE REDUCTION: This exam was performed according to the departmental dose-optimization program which includes automated exposure control, adjustment of the mA and/or kV according to patient size and/or use of iterative reconstruction technique. COMPARISON:  Chest radiographs 01/28/2023, CT cervical spine 10/16/2021, CT chest, abdomen and pelvis 03/25/2022 and MRI thoracic spine 06/22/2022. FINDINGS: Alignment: Normal. Vertebrae: Since the previous thoracic MRI and CTs of the chest, abdomen and pelvis, the thoracolumbar fusion has been revised. Patient is now posteriorly fused from T8 through L3. The T11 pedicle screws have been removed. There are no pedicle screws at the level of the previous L1 corpectomy. The current hardware appears intact with mild chronic loosening of the L3 pedicle screws. Findings of discitis and osteomyelitis at T10-11 do not appear progressive from the previous thoracic MRI. Findings of discitis and osteomyelitis at T4-5 have progressed from the previous MRI, with progressive endplate destruction and mild loss of vertebral body height. No new levels of discitis or osteomyelitis are suggested. There is no evidence of acute fracture or traumatic subluxation. Paraspinal  and other soft tissues: There are increased paraspinal inflammatory changes at T4-5. No significant epidural component is seen. There are patchy ground-glass opacities in both lungs which may reflect edema or atelectasis. Disc levels: As above, progressive changes of diskitis and osteomyelitis with associated increased paraspinal inflammation at T4-5. No apparent significant epidural component by CT. There is some soft tissue thickening in both foramina. No large disc herniations or significant spinal stenosis demonstrated at the additional levels. Disc space evaluation at the fused levels is mildly limited by hardware artifact. IMPRESSION: 1. Progressive changes of discitis and osteomyelitis at T4-5 with progressive endplate destruction and mild loss of vertebral body height. Associated paraspinal inflammatory changes have increased. Consider follow-up thoracic MRI to better evaluate the spinal canal at this level. 2. No significant progression of discitis and osteomyelitis at T10-11. 3. No evidence of acute fracture or traumatic subluxation. 4. Interval revision of thoracolumbar fusion with posterior fusion from T8 through L3. The current hardware appears intact with mild chronic loosening of the L3 pedicle screws. Electronically Signed   By: Richardean Sale M.D.   On: 01/28/2023 18:15   DG Chest 2 View  Result Date: 01/28/2023 CLINICAL DATA:  chest pain EXAM: CHEST - 2 VIEW COMPARISON:  12/13/2022 FINDINGS: Relatively low lung volumes with crowding of bronchovascular structures. Linear scarring or atelectasis in the right midlung more conspicuous. Heart size upper limits normal. No effusion. Thoracolumbar fixation hardware partially visualized. Presumed posttraumatic deformity of the left humeral head. IMPRESSION: Low volumes. No acute findings. Electronically Signed   By: Lucrezia Europe M.D.   On: 01/28/2023 16:50    Scheduled Meds:  insulin aspart  0-9 Units Subcutaneous Q4H    Continuous Infusions:   dextrose 5 % and 0.45% NaCl 75 mL/hr at 01/29/23 1535     LOS: 0 days     Alma Friendly, MD [facility]  If 7PM-7AM, please contact night-coverage [email or web address] 01/29/2023, 4:02 PM
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
## 2023-01-29 NOTE — ED Notes (Signed)
Spoke with Dr Marlowe Sax regarding hypoglycemia and ordered hyperkalemia treatment. Dr Marlowe Sax states to hold IV insulin at this time due to hypoglycemia and continue to monitor pt's blood sugars.

## 2023-01-30 ENCOUNTER — Other Ambulatory Visit (HOSPITAL_COMMUNITY): Payer: Self-pay

## 2023-01-30 ENCOUNTER — Inpatient Hospital Stay (HOSPITAL_COMMUNITY): Payer: PRIVATE HEALTH INSURANCE

## 2023-01-30 ENCOUNTER — Encounter (HOSPITAL_COMMUNITY): Payer: Self-pay | Admitting: Internal Medicine

## 2023-01-30 DIAGNOSIS — M4624 Osteomyelitis of vertebra, thoracic region: Secondary | ICD-10-CM

## 2023-01-30 DIAGNOSIS — M4620 Osteomyelitis of vertebra, site unspecified: Secondary | ICD-10-CM | POA: Diagnosis not present

## 2023-01-30 HISTORY — PX: IR THORACIC DISC ASPIRATION W/IMG GUIDE: IMG943

## 2023-01-30 LAB — BASIC METABOLIC PANEL
Anion gap: 9 (ref 5–15)
Anion gap: 9 (ref 5–15)
BUN: 64 mg/dL — ABNORMAL HIGH (ref 8–23)
BUN: 66 mg/dL — ABNORMAL HIGH (ref 8–23)
CO2: 15 mmol/L — ABNORMAL LOW (ref 22–32)
CO2: 16 mmol/L — ABNORMAL LOW (ref 22–32)
Calcium: 8.5 mg/dL — ABNORMAL LOW (ref 8.9–10.3)
Calcium: 8.5 mg/dL — ABNORMAL LOW (ref 8.9–10.3)
Chloride: 113 mmol/L — ABNORMAL HIGH (ref 98–111)
Chloride: 112 mmol/L — ABNORMAL HIGH (ref 98–111)
Creatinine, Ser: 4.81 mg/dL — ABNORMAL HIGH (ref 0.44–1.00)
Creatinine, Ser: 4.93 mg/dL — ABNORMAL HIGH (ref 0.44–1.00)
GFR, Estimated: 9 mL/min — ABNORMAL LOW (ref >60)
GFR, Estimated: 9 mL/min — ABNORMAL LOW (ref >60)
Glucose, Bld: 117 mg/dL — ABNORMAL HIGH (ref 70–99)
Glucose, Bld: 88 mg/dL (ref 70–99)
Potassium: 6 mmol/L — ABNORMAL HIGH (ref 3.5–5.1)
Potassium: 5.9 mmol/L — ABNORMAL HIGH (ref 3.5–5.1)
Sodium: 136 mmol/L (ref 135–145)
Sodium: 137 mmol/L (ref 135–145)

## 2023-01-30 LAB — GLUCOSE, CAPILLARY
Glucose-Capillary: 71 mg/dL (ref 70–99)
Glucose-Capillary: 91 mg/dL (ref 70–99)
Glucose-Capillary: 93 mg/dL (ref 70–99)
Glucose-Capillary: 64 mg/dL — ABNORMAL LOW (ref 70–99)
Glucose-Capillary: 79 mg/dL (ref 70–99)
Glucose-Capillary: 93 mg/dL (ref 70–99)
Glucose-Capillary: 94 mg/dL (ref 70–99)

## 2023-01-30 LAB — CBC WITH DIFFERENTIAL/PLATELET
Abs Immature Granulocytes: 0.03 10*3/uL (ref 0.00–0.07)
Basophils Absolute: 0.1 10*3/uL (ref 0.0–0.1)
Basophils Relative: 1 %
Eosinophils Absolute: 0.2 10*3/uL (ref 0.0–0.5)
Eosinophils Relative: 4 %
HCT: 27.2 % — ABNORMAL LOW (ref 36.0–46.0)
Hemoglobin: 8.2 g/dL — ABNORMAL LOW (ref 12.0–15.0)
Immature Granulocytes: 0 %
Lymphocytes Relative: 10 %
Lymphs Abs: 0.7 10*3/uL (ref 0.7–4.0)
MCH: 28 pg (ref 26.0–34.0)
MCHC: 30.1 g/dL (ref 30.0–36.0)
MCV: 92.8 fL (ref 80.0–100.0)
Monocytes Absolute: 0.6 10*3/uL (ref 0.1–1.0)
Monocytes Relative: 9 %
Neutro Abs: 5.3 10*3/uL (ref 1.7–7.7)
Neutrophils Relative %: 76 %
Platelets: 168 10*3/uL (ref 150–400)
RBC: 2.93 MIL/uL — ABNORMAL LOW (ref 3.87–5.11)
RDW: 15.2 % (ref 11.5–15.5)
WBC: 6.9 10*3/uL (ref 4.0–10.5)
nRBC: 0 % (ref 0.0–0.2)

## 2023-01-30 LAB — SEDIMENTATION RATE: Sed Rate: 97 mm/hr — ABNORMAL HIGH (ref 0–22)

## 2023-01-30 LAB — PROTIME-INR
INR: 1.2 (ref 0.8–1.2)
Prothrombin Time: 14.6 seconds (ref 11.4–15.2)

## 2023-01-30 LAB — C-REACTIVE PROTEIN: CRP: 7.4 mg/dL — ABNORMAL HIGH (ref <1.0)

## 2023-01-30 LAB — MAGNESIUM: Magnesium: 2.1 mg/dL (ref 1.7–2.4)

## 2023-01-30 MED ORDER — LIDOCAINE HCL (PF) 1 % IJ SOLN
INTRAMUSCULAR | Status: AC
  Administered 2023-01-30: 20 mL
  Filled 2023-01-30: qty 30

## 2023-01-30 MED ORDER — ALBUTEROL SULFATE (2.5 MG/3ML) 0.083% IN NEBU
10.0000 mg | INHALATION_SOLUTION | Freq: Once | RESPIRATORY_TRACT | Status: AC
  Administered 2023-01-30: 10 mg via RESPIRATORY_TRACT
  Filled 2023-01-30: qty 12

## 2023-01-30 MED ORDER — DEXTROSE-NACL 5-0.45 % IV SOLN: INTRAVENOUS | Status: DC

## 2023-01-30 MED ORDER — FENTANYL CITRATE (PF) 100 MCG/2ML IJ SOLN
INTRAMUSCULAR | Status: AC
  Filled 2023-01-30: qty 2

## 2023-01-30 MED ORDER — ORAL CARE MOUTH RINSE: 15.0000 mL | OROMUCOSAL | Status: DC | PRN

## 2023-01-30 MED ORDER — FENTANYL CITRATE (PF) 100 MCG/2ML IJ SOLN
INTRAMUSCULAR | Status: AC | PRN
  Administered 2023-01-30: 50 ug via INTRAVENOUS
  Administered 2023-01-30: 25 ug via INTRAVENOUS

## 2023-01-30 MED ORDER — BUPIVACAINE HCL (PF) 0.5 % IJ SOLN
INTRAMUSCULAR | Status: AC
  Administered 2023-01-30: 15 mL
  Filled 2023-01-30: qty 30

## 2023-01-30 MED ORDER — EVENITY 105 MG/1.17ML ~~LOC~~ SOSY: 210.0000 mg | PREFILLED_SYRINGE | Freq: Once | SUBCUTANEOUS | 0 refills | Status: AC

## 2023-01-30 MED ORDER — SODIUM ZIRCONIUM CYCLOSILICATE 10 G PO PACK
10.0000 g | PACK | Freq: Two times a day (BID) | ORAL | Status: AC
  Administered 2023-01-30 (×2): 10 g via ORAL
  Filled 2023-01-30 (×2): qty 1

## 2023-01-30 NOTE — Progress Notes (Signed)
PROGRESS NOTE  [name] [name] [id number] DOB: 15-Nov-1955 DOA: 01/28/2023 PCP: Ann Held, DO   HPI/Recap of past 24 hours: [name] [name] is a 67 y.o. 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 Admitted in August 2023 for T10-T11 discitis osteomyelitis s/p posterior arthrodesis T8-T11 and removal of T11 pedicle screws and rod removal.  Patient presents to the ED for progressively worsening upper/mid back pain as well as chest pain.  Reports temp of 103 at home.  VS here in the ED fairly stable. Labs fairly stable at baseline, troponin negative x 2, D-dimer 2.08 (was 2.32 on labs done 6 weeks ago), blood cultures drawn.  Chest x-ray negative for acute finding. Neurosurgery consulted and recommended the need for disc space biopsy if blood cultures are negative, holding antibiotics, but if blood cultures positive then start treatment.  Admitted by [facility] hospitalist for further management.    Today, patient continues to complain of back pain, denies any other new complaints.  Noted to have some labored breathing, IV fluids held.     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  If blood cultures positive, then start antibiotics IR consulted for disc space biopsy, on 4/2 multiple attempts without success, will attempt biopsy on 01/31/2023 ID  consulted for antibiotic management Continue pain management   Atypical chest pain History of CAD Chest pain likely related to problem listed above Troponin negative x 2 and not consistent with ACS D-dimer slightly elevated in the setting of chronic DVT with hx of osteomyelitis, but stable since labs done 6 weeks ago  Chronic DVT Hold Eliquis at this time in anticipation of possible disc space biopsy (reported not taking)   CKD stage V Metabolic acidosis Hyperkalemia Creatinine around baseline Started on Lokelma, monitor potassium closely Frequent BMP  Hypomagnesemia Replace as needed  Chronic normocytic anemia Anemia of chronic kidney disease Hemoglobin stable Daily CBC   Chronic HFpEF BNP 171 EF 65 to 70% on echo done in August 2023 No signs of volume overload Monitor closely   Insulin-dependent type 2 diabetes Last A1c 4.6 in April 2023 Had an hypoglycemic episode on 3/31 SSI, Accu-Cheks, hypoglycemic protocol   Hypertension Continue Coreg   Obesity Lifestyle modification advised      Estimated body mass index is 36.56 kg/m as calculated from the following:   Height as of this encounter: 4\' 11"  (1.499 m).   Weight as of this encounter: 82.1 kg.     Code Status: Full  Family Communication: None at bedside  Disposition Plan: Status is: Inpatient Remains inpatient appropriate because: Level of care      Consultants: Neurosurgery IR ID  Procedures: Disc space biopsy  Antimicrobials: None  DVT prophylaxis: SCDs   Objective: Vitals:   01/30/23 1520 01/30/23 1535 01/30/23 1549 01/30/23 1600  BP: 113/89 121/61 121/83   Pulse: 65 63 64 63  Resp: 20 (!) 22 14  16  Temp:   97.9 F (36.6 C)   TempSrc:   Oral   SpO2: 98% 97% 95% 95%  Weight:      Height:        Intake/Output Summary (Last 24 hours) at 01/30/2023 1652 Last data filed at 01/30/2023 1619 Gross per 24 hour  Intake 871.18 ml  Output 500 ml  Net 371.18 ml   Filed Weights    01/28/23 1450 01/29/23 0116  Weight: 88.5 kg 82.1 kg    Exam: General: NAD  Cardiovascular: S1, S2 present Respiratory: CTAB Abdomen: Soft, nontender, nondistended, bowel sounds present Musculoskeletal: No bilateral pedal edema noted Skin: Normal Psychiatry: Normal mood     Data Reviewed: CBC: Recent Labs  Lab 01/28/23 1539 01/29/23 0025 01/30/23 0858  WBC 6.5 7.3 6.9  NEUTROABS  --   --  5.3  HGB 8.6* 8.2* 8.2*  HCT 28.6* 28.2* 27.2*  MCV 92.6 94.9 92.8  PLT 177 164 [id number]   Basic Metabolic Panel: Recent Labs  Lab 01/28/23 1539 01/29/23 0025 01/29/23 0327 01/30/23 0858  NA 137 124*  --  136  K 5.6* 4.7  --  6.0*  CL 112* 98  --  113*  CO2 18* 16*  --  15*  GLUCOSE 86 98  --  117*  BUN 67* 28*  --  64*  CREATININE 4.24* 0.88  --  4.81*  CALCIUM 8.1* 9.0  --  8.5*  MG  --   --  1.6* 2.1   GFR: Estimated Creatinine Clearance (by C-G formula based on SCr of 4.81 mg/dL (H)) Female: 10.5 mL/min (A) Female: 13 mL/min (A) Liver Function Tests: Recent Labs  Lab 01/29/23 0025  AST 10*  ALT 6  ALKPHOS 131*  BILITOT 0.6  PROT 7.4  ALBUMIN 2.9*   Recent Labs  Lab 01/29/23 0025  LIPASE 48   No results for input(s): "AMMONIA" in the last 168 hours. Coagulation Profile: Recent Labs  Lab 01/30/23 0858  INR 1.2   Cardiac Enzymes: No results for input(s): "CKTOTAL", "CKMB", "CKMBINDEX", "TROPONINI" in the last 168 hours. BNP (last 3 results) No results for input(s): "PROBNP" in the last 8760 hours. HbA1C: No results for input(s): "HGBA1C" in the last 72 hours. CBG: Recent Labs  Lab 01/30/23 0256 01/30/23 0803 01/30/23 1150 01/30/23 1606 01/30/23 1617  GLUCAP 71 91 93 64* 79   Lipid Profile: No results for input(s): "CHOL", "HDL", "LDLCALC", "TRIG", "CHOLHDL", "LDLDIRECT" in the last 72 hours. Thyroid Function Tests: No results for input(s): "TSH", "T4TOTAL", "FREET4", "T3FREE", "THYROIDAB" in the last 72 hours. Anemia Panel: No results for  input(s): "VITAMINB12", "FOLATE", "FERRITIN", "TIBC", "IRON", "RETICCTPCT" in the last 72 hours. Urine analysis:    Component Value Date/Time   COLORURINE YELLOW 06/20/2022 1900   APPEARANCEUR CLOUDY (A) 06/20/2022 1900   LABSPEC 1.013 06/20/2022 1900   PHURINE 5.0 06/20/2022 1900   GLUCOSEU 50 (A) 06/20/2022 1900   GLUCOSEU 500 (A) 12/03/2019 1309   HGBUR MODERATE (A) 06/20/2022 1900   HGBUR moderate 08/05/2010 1048   BILIRUBINUR NEGATIVE 06/20/2022 1900   BILIRUBINUR neg 11/15/2017 1241   KETONESUR NEGATIVE 06/20/2022 1900   PROTEINUR 100 (A) 06/20/2022 1900   UROBILINOGEN >=8.0 (A) 12/03/2019 1309   NITRITE NEGATIVE 06/20/2022 1900   LEUKOCYTESUR LARGE (A) 06/20/2022 1900   Sepsis Labs: @LABRCNTIP (procalcitonin:4,lacticidven:4)  ) Recent Results (from the past 240 hour(s))  Blood culture (routine x 2)     Status: None (Preliminary result)   Collection Time: 01/28/23  9:00  PM   Specimen: BLOOD  Result Value Ref Range Status   Specimen Description   Final    BLOOD SITE NOT SPECIFIED Performed at [facility], [place] [address] [place], [place] [address]    Special Requests   Final    BOTTLES DRAWN AEROBIC ONLY Blood Culture results may not be optimal due to an inadequate volume of blood received in culture bottles Performed at [facility] [address] [place], [place] [address]    Culture   Final    NO GROWTH 1 DAY Performed at [facility], [place] [address] [place], [place] [address]    Report Status PENDING  Incomplete  Blood culture (routine x 2)     Status: None (Preliminary result)   Collection Time: 01/28/23  9:10 PM   Specimen: BLOOD  Result Value Ref Range Status   Specimen Description   Final    BLOOD SITE NOT SPECIFIED Performed at [facility] [address], [place], [place] [address]    Special Requests   Final    BOTTLES DRAWN AEROBIC AND ANAEROBIC Blood Culture adequate volume Performed at [facility] [address], [place], [place] [address]    Culture   Final    NO GROWTH 1 DAY Performed at [facility], [place] [address], [place], [place] [address]    Report Status PENDING  Incomplete  MRSA Next Gen by PCR, Nasal     Status: None   Collection Time: 01/29/23  3:54 AM   Specimen: Nasal Mucosa; Nasal Swab  Result Value Ref Range Status   MRSA by PCR Next Gen NOT DETECTED NOT DETECTED Final    Comment: (NOTE) The GeneXpert MRSA Assay (FDA approved for NASAL specimens only), is one component of a comprehensive MRSA colonization surveillance program. It is not intended to diagnose MRSA infection nor to guide or monitor treatment for MRSA infections. Test performance is not FDA approved in patients less than 30 years old. Performed at [facility], [place] [address], [place], [place] [address]       Studies: No results found.  Scheduled Meds:  carvedilol  12.5 mg Oral Daily   DULoxetine  60 mg Oral Daily   insulin aspart  0-9 Units Subcutaneous Q4H   sodium zirconium cyclosilicate  10 g Oral BID    Continuous Infusions:     LOS: 1 day     Alma Friendly, MD [facility]  If 7PM-7AM, please contact night-coverage [email or web address] 01/30/2023, 4:52 PM

## 2023-01-30 NOTE — Procedures (Signed)
INTERVENTIONAL NEURORADIOLOGY BRIEF POSTPROCEDURE NOTE  ATTEMPTED FLUOROSCOPY GUIDED T4-5 [place] ASPIRATION    Attending: Dr. Pedro Earls   Diagnosis: T4-5 discitis/osteomyelitis    Access site: Percutaneous    Anesthesia: Local anesthesia +fentanyl.    Medication used: 75 mcg Fentanyl IV.   Complications: None    Estimated blood loss: None    Specimen: None.    Multiple attempts made to reach the T4-5 intervertebral disc with a 20 gauge needle proved unsuccessful as no adequate and safe soft tissue window could be found.  No consent was obtained for transpedicular approach and patient was uncomfortable on the table.  Therefore, decision was made to stop.

## 2023-01-30 NOTE — Addendum Note (Signed)
Addended by: Kem Boroughs D on: 01/30/2023 03:14 PM   Modules accepted: Orders

## 2023-01-30 NOTE — Progress Notes (Signed)
Patient not cooperating and continued to move during exam.  Several attemps made to obtain images.  01/30/23 @ 6:04 PM.

## 2023-01-30 NOTE — Progress Notes (Signed)
Unsuccessful T4-5 disc aspiration today.   Discussed with attending and ID, ID requested  re- attempt  transpedicular approach disc aspiration.   Patient will be NPO again at [facility], potassium this morning was 6 , repeat BMP pending.   Risk/benefit of transpedicular approach disc aspiration will be discussed with the patient tomorrow and will obtain new consent.   PLAN - NPO at [facility] - No AC/AP till procedure is done - Hyperkalemia to be corrected by the primary team    [place] PA-C 01/30/2023 4:54 PM

## 2023-01-30 NOTE — Telephone Encounter (Signed)
Spoke with pharmacy and they were unable to run test claim to see how much rx would be for patient.  Rx was sent to pharmacy for them to get her in the system.  [place] [phone] and Fax number [phone]. Will try to call back in am to check.  Also spoke with Ivin Booty and she stated we should run through medical.  She looked at the past claims and the one in January looked like it was paid. Will try and call to check.  Per rx prior auth team all [place] is ran through medical.
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
## 2023-01-30 NOTE — Consult Note (Signed)
[facility]    Date of Admission:  01/28/2023     Total days of antibiotics 0               Reason for Consult: Thoracic discitis    Referring Provider: Dr. Horris Latino Primary Care Provider: Carollee Herter, Alferd Apa, DO   ASSESSMENT:  [name] is a 67 y/o female admitted worsening back pain and found to have progressive T4-5 osteomyelitis/discitis in the setting of recent treatment for MRSE T8-T11 and early T4-5 discitis/osteomyelitis in August 2023 s/p treatment with Daptomycin. No neurosurgical intervention recommended at this time and likely a poor candidate given multiple co-morbid conditions. Awaiting IR aspiration with culture to help guide antibiotic therapy. Will need at least 6 weeks of IV antibiotics and likely suppression following treatment given worsening. Renal function remains consistent with CKD Stage V and will need renally dosed antibiotics. Given worsening will also obtaining imaging of cervical spine to ensure no new areas of infection. Continue to hold antibiotics until AFTER aspiration and can start Daptomycin. Remaining medical care per primary team.   PLAN:  Continue to hold antibiotics while awaiting IR aspiration.  Will need renally dosed antibiotics given CKD Stage V.  Start Daptomycin AFTER aspiration.  Monitor blood cultures for bacteremia.  Remaining medical and supportive care per Internal Medicine.    Principal Problem:   Vertebral osteomyelitis Active Problems:   Essential hypertension   Chest pain   Nausea and vomiting   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   Type 2 diabetes mellitus   CKD (chronic kidney disease) stage 5, GFR less than 15 ml/min    carvedilol  12.5 mg Oral Daily   DULoxetine  60 mg Oral Daily   insulin aspart  0-9 Units Subcutaneous Q4H     HPI: [name] [name] is a 67 y.o. adult with previous medical history of CAD, chronic DVT on Eliquis, CKD Stage V, chronic HFpEF,  liver cirrhosis, diabetes, hypertension, legal blindness and recent history of MRSE discitis/osteomyelitis admitted to the hospital with radiating back pain.   Ms. [name] initially underwent L1 corpectomy and reconstruction with percutaneous instrumentation from T11-L3 for severe Pseudomonas osteomyelitis in [id number] and course complicated by development of progressive back pain and concern for indolent T10-11 osteomyelitis/discitis with infection of the right T10 screw as well as hardware failure. In August 2023 underwent posterior arthrodesis at T8-9, T9-10, T10-11; Segmental instrumentation with T8, T9, T10 pedicle screws connecting to previous instrumentation; and Removal of T11 pedicle screws and partial removal of rod. Cultures from surgery did not have any growth however previous aspiration/wound culture grew MRSE. This was treated with 6 weeks of Daptomycin.   Ms. [name] was last seen by Dr. Megan Salon on 08/23/22 after switching to doxycycline and with history of difficulty tolerating antibiotics doxycyline was stopped and decided on monitoring off antibiotics. This has been okay until about 3 weeks ago when she developed new onset back pain that she initially thought would be improved however seen in the ED on 3/31 with increasing back pain.   Afebrile since admission with imaging CT and MRI imaging the of thoracic spine showing progressive changes of osteomyelitis/discitis with progressive endplate destruction at T4-T5 compared to August 2023 where early discitis/osteomyelitis with no abscess or stenosis was seen. There was edematous changes with mild encroachment on spinal cord with no compression. Neurosurgery recommending IR aspiration as no further surgical intervention indicated and given her poor performance status and  medical comorbidities would place her at risk to do poorly with additional surgical intervention. She has not received any antibiotics and is currently awaiting disc aspiration in  attempt to guide antibiotic therapy. Denies any current numbness or tingling, changes to bowl/bladder habits, or any new trauma or injury. Remains in CKD Stage V with creatinine 4.81, eGFR 9 and CrCl 10.5 and will need renally dosed antibiotics. CRP 7.4. Liver function testing normal and platelets 164.    Review of Systems: Review of Systems  Constitutional:  Negative for chills, fever and weight loss.  Respiratory:  Negative for cough, shortness of breath and wheezing.   Cardiovascular:  Negative for chest pain and leg swelling.  Gastrointestinal:  Negative for abdominal pain, constipation, diarrhea, nausea and vomiting.  Musculoskeletal:  Positive for back pain.  Skin:  Negative for rash.     Past Medical History:  Diagnosis Date   Acute MI 2007   presented to ED & had cardiac cath- but found to have normal coronaries. Since that point in time her PCP cares f or cardiac needs. Dr. 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   Qualifier: Diagnosis of  By: Charlott Holler CMA, [place]     Type 2 diabetes mellitus with hyperglycemia, with long-term current use of insulin 04/06/2017    Social History   Tobacco Use   Smoking status: Never    Passive exposure: Never   Smokeless tobacco: Never  Vaping Use   Vaping Use: Never used  Substance Use Topics   Alcohol use: No    Alcohol/week: 0.0 standard drinks of alcohol   Drug use: No    Family History  Problem Relation Age of Onset   Heart disease Father    Diabetes Father    Colitis Father    Crohn's disease Father    Cancer Father        leukemia   Leukemia Father    Diabetes Mellitus II Brother    Kidney  disease Brother    Heart disease Brother    Diabetes Mother    Hypertension Mother    Mental illness Mother    Irritable bowel syndrome Daughter    Diabetes Mellitus II Brother    Kidney disease Brother    Liver disease Brother    Kidney disease Brother    Heart attack Brother    Diabetes Mellitus II Brother    Heart disease Brother    Liver disease Brother    Kidney disease Brother    Kidney disease Brother    Diabetes Mellitus II Brother    Diabetes Mellitus I Brother     Allergies  Allergen Reactions   Bee Pollen Anaphylaxis   Broccoli [Brassica Oleracea] Anaphylaxis and Hives   Fish-Derived Products Anaphylaxis and Hives   Mushroom Extract Complex Anaphylaxis   Penicillins Anaphylaxis    **Tolerated cefepime March 2021 Did it involve swelling of the face/tongue/throat, SOB, or low BP? Yes Did it involve sudden or severe rash/hives, skin peeling, or any reaction on the inside of your mouth or nose? No Did you need to seek medical  attention at a hospital or doctor's office? Yes When did it last happen?  A few months ago If all above answers are "NO", may proceed with cephalosporin use.   Rosemary Oil Anaphylaxis   Shellfish Allergy Anaphylaxis and Hives   Tomato Anaphylaxis and Hives    Raw tomatoes only, can tolerate if cooked   Acetaminophen Other (See Comments)    GI upset   Aloe Vera Hives   Doxycycline Diarrhea   Acyclovir And Related Other (See Comments)    Unknown reaction   Naproxen Hives    OBJECTIVE: Blood pressure (!) 115/59, pulse 69, temperature 98.2 F (36.8 C), temperature source Oral, resp. rate 11, height 4\' 11"  (1.499 m), weight 82.1 kg, SpO2 96 %.  Physical Exam Constitutional:      General: She is not in acute distress.    Appearance: She is well-developed.  Cardiovascular:     Rate and Rhythm: Normal rate and regular rhythm.     Heart sounds: Normal heart sounds.  Pulmonary:     Effort: Pulmonary effort is normal.     Breath sounds: Normal breath sounds.  Skin:    General: Skin is warm and dry.  Neurological:     Mental Status: She is alert and oriented to person, place, and time.  Psychiatric:        Behavior: Behavior normal.        Thought Content: Thought content normal.        Judgment: Judgment normal.     Lab Results Lab Results  Component Value Date   WBC 6.9 01/30/2023   HGB 8.2 (L) 01/30/2023   HCT 27.2 (L) 01/30/2023   MCV 92.8 01/30/2023   PLT 168 01/30/2023    Lab Results  Component Value Date   CREATININE 0.88 01/29/2023   BUN 28 (H) 01/29/2023   NA 124 (L) 01/29/2023   K 4.7 01/29/2023   CL 98 01/29/2023   CO2 16 (L) 01/29/2023    Lab Results  Component Value Date   ALT 6 01/29/2023   AST 10 (L) 01/29/2023   ALKPHOS 131 (H) 01/29/2023   BILITOT 0.6 01/29/2023     Microbiology: Recent Results (from the past 240 hour(s))  Blood culture (routine x 2)     Status: None (Preliminary result)   Collection Time: 01/28/23  9:00 PM   Specimen:  BLOOD  Result  Value Ref Range Status   Specimen Description   Final    BLOOD SITE NOT SPECIFIED Performed at [facility] [address], [place], [place] [address]    Special Requests   Final    BOTTLES DRAWN AEROBIC ONLY Blood Culture results may not be optimal due to an inadequate volume of blood received in culture bottles Performed at [facility] [address] [place], [place] [address]    Culture   Final    NO GROWTH 1 DAY Performed at [facility], [place] [address] [place], [place] [address]    Report Status PENDING  Incomplete  Blood culture (routine x 2)     Status: None (Preliminary result)   Collection Time: 01/28/23  9:10 PM   Specimen: BLOOD  Result Value Ref Range Status   Specimen Description   Final    BLOOD SITE NOT SPECIFIED Performed at [facility] [address] [place], [place] [address]    Special Requests   Final    BOTTLES DRAWN AEROBIC AND ANAEROBIC Blood Culture adequate volume Performed at [facility] [address] [place], [place] [address]    Culture   Final    NO GROWTH 1 DAY Performed at [facility], [place] [address], [place], [place] [address]    Report Status PENDING  Incomplete  MRSA Next Gen by PCR, Nasal     Status: None   Collection Time: 01/29/23  3:54 AM   Specimen: Nasal Mucosa; Nasal Swab  Result Value Ref Range Status   MRSA by PCR Next Gen NOT DETECTED NOT DETECTED Final    Comment: (NOTE) The GeneXpert MRSA Assay (FDA approved for NASAL specimens only), is one component of a comprehensive MRSA colonization surveillance program. It is not intended to diagnose MRSA infection nor to guide or monitor treatment for MRSA infections. Test performance is not FDA approved in patients less than 5 years old. Performed at [facility], [place] [address], [place], [place] [address]      Terri Piedra, [facility]  01/30/2023  10:59 AM

## 2023-01-30 NOTE — Telephone Encounter (Signed)
[  8:15 AM] [name] [name]      [8:23 AM] [name] [name] Out-of-pocket cost due at time of visit: $443   Primary: Medicare  [facility] co-insurance: 20% (approximately $418)  Admin fee co-insurance: 20% (approximately $25)  [8:24 AM] [name] [name] I looked her up in [facility] and it said 20% for co-insurance and admin fee

## 2023-01-31 ENCOUNTER — Other Ambulatory Visit: Payer: Self-pay | Admitting: Family Medicine

## 2023-01-31 ENCOUNTER — Inpatient Hospital Stay (HOSPITAL_COMMUNITY): Payer: PRIVATE HEALTH INSURANCE

## 2023-01-31 ENCOUNTER — Inpatient Hospital Stay: Payer: PRIVATE HEALTH INSURANCE

## 2023-01-31 ENCOUNTER — Encounter: Admit: 2023-01-31 | Discharge: 2023-01-31 | Payer: MEDICARE

## 2023-01-31 DIAGNOSIS — M8610 Other acute osteomyelitis, unspecified site: Secondary | ICD-10-CM

## 2023-01-31 DIAGNOSIS — M4624 Osteomyelitis of vertebra, thoracic region: Secondary | ICD-10-CM | POA: Diagnosis not present

## 2023-01-31 DIAGNOSIS — M4620 Osteomyelitis of vertebra, site unspecified: Secondary | ICD-10-CM | POA: Diagnosis not present

## 2023-01-31 DIAGNOSIS — Z981 Arthrodesis status: Secondary | ICD-10-CM

## 2023-01-31 DIAGNOSIS — G894 Chronic pain syndrome: Secondary | ICD-10-CM

## 2023-01-31 LAB — CBC WITH DIFFERENTIAL/PLATELET
Abs Immature Granulocytes: 0.03 10*3/uL (ref 0.00–0.07)
Basophils Absolute: 0 10*3/uL (ref 0.0–0.1)
Basophils Relative: 1 %
Eosinophils Absolute: 0.2 10*3/uL (ref 0.0–0.5)
Eosinophils Relative: 4 %
HCT: 25.7 % — ABNORMAL LOW (ref 36.0–46.0)
Hemoglobin: 7.8 g/dL — ABNORMAL LOW (ref 12.0–15.0)
Immature Granulocytes: 1 %
Lymphocytes Relative: 13 %
Lymphs Abs: 0.7 10*3/uL (ref 0.7–4.0)
MCH: 28.1 pg (ref 26.0–34.0)
MCHC: 30.4 g/dL (ref 30.0–36.0)
MCV: 92.4 fL (ref 80.0–100.0)
Monocytes Absolute: 0.5 10*3/uL (ref 0.1–1.0)
Monocytes Relative: 9 %
Neutro Abs: 3.7 10*3/uL (ref 1.7–7.7)
Neutrophils Relative %: 72 %
Platelets: 150 10*3/uL (ref 150–400)
RBC: 2.78 MIL/uL — ABNORMAL LOW (ref 3.87–5.11)
RDW: 15 % (ref 11.5–15.5)
WBC: 5.1 10*3/uL (ref 4.0–10.5)
nRBC: 0 % (ref 0.0–0.2)

## 2023-01-31 LAB — GLUCOSE, CAPILLARY
Glucose-Capillary: 66 mg/dL — ABNORMAL LOW (ref 70–99)
Glucose-Capillary: 68 mg/dL — ABNORMAL LOW (ref 70–99)
Glucose-Capillary: 128 mg/dL — ABNORMAL HIGH (ref 70–99)
Glucose-Capillary: 72 mg/dL (ref 70–99)
Glucose-Capillary: 71 mg/dL (ref 70–99)
Glucose-Capillary: 60 mg/dL — ABNORMAL LOW (ref 70–99)
Glucose-Capillary: 78 mg/dL (ref 70–99)
Glucose-Capillary: 86 mg/dL (ref 70–99)

## 2023-01-31 LAB — BASIC METABOLIC PANEL
Anion gap: 8 (ref 5–15)
BUN: 72 mg/dL — ABNORMAL HIGH (ref 8–23)
CO2: 14 mmol/L — ABNORMAL LOW (ref 22–32)
Calcium: 8.1 mg/dL — ABNORMAL LOW (ref 8.9–10.3)
Chloride: 110 mmol/L (ref 98–111)
Creatinine, Ser: 5.34 mg/dL — ABNORMAL HIGH (ref 0.44–1.00)
GFR, Estimated: 8 mL/min — ABNORMAL LOW (ref >60)
Glucose, Bld: 144 mg/dL — ABNORMAL HIGH (ref 70–99)
Potassium: 5.3 mmol/L — ABNORMAL HIGH (ref 3.5–5.1)
Sodium: 132 mmol/L — ABNORMAL LOW (ref 135–145)

## 2023-01-31 MED ORDER — BETAMETHASONE VALERATE 0.1 % TP CREA
11 refills | 30.00000 days | Status: AC
Start: 2023-01-31 — End: ?

## 2023-01-31 MED ORDER — CARVEDILOL 6.25 MG PO TABS
6.2500 mg | ORAL_TABLET | Freq: Two times a day (BID) | ORAL | Status: DC
  Administered 2023-01-31 – 2023-02-04 (×8): 6.25 mg via ORAL
  Filled 2023-01-31 (×8): qty 1

## 2023-01-31 MED ORDER — FENTANYL CITRATE (PF) 100 MCG/2ML IJ SOLN
INTRAMUSCULAR | Status: AC
  Filled 2023-01-31: qty 2

## 2023-01-31 MED ORDER — DEXTROSE-NACL 5-0.9 % IV SOLN: INTRAVENOUS | Status: DC

## 2023-01-31 MED ORDER — SODIUM ZIRCONIUM CYCLOSILICATE 10 G PO PACK
10.0000 g | PACK | Freq: Every day | ORAL | Status: AC
  Administered 2023-01-31: 10 g via ORAL
  Filled 2023-01-31: qty 1

## 2023-01-31 MED ORDER — MIDAZOLAM HCL 2 MG/2ML IJ SOLN
INTRAMUSCULAR | Status: AC
  Filled 2023-01-31: qty 2

## 2023-01-31 MED ORDER — BUPIVACAINE HCL (PF) 0.5 % IJ SOLN
INTRAMUSCULAR | Status: AC
  Filled 2023-01-31: qty 30

## 2023-01-31 MED ORDER — DEXTROSE 50 % IV SOLN
12.5000 g | INTRAVENOUS | Status: AC
  Administered 2023-01-31: 12.5 g via INTRAVENOUS
  Filled 2023-01-31: qty 50

## 2023-01-31 NOTE — Progress Notes (Signed)
[facility]  Date of Admission:  01/28/2023     Total days of antibiotics 0         ASSESSMENT:  IR was unable to successfully aspirate despite multiple attempts and will try a different approach. Recommend continuing to hold antibiotics to optimize any culture growth and direct treatment. Plan of care discussed with Ms. Devito and family at bedside. Will need PICC line for prolonged course of antibiotics. Remaining medical and supportive care per Internal Medicine.   PLAN:  Continue to hold antibiotics. IR attempted aspiration when able.  Cervical spine imaging.  Will need PICC line for prolonged antibiotics.   Principal Problem:   Vertebral osteomyelitis Active Problems:   Essential hypertension   Chest pain   Nausea and vomiting   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   Type 2 diabetes mellitus   CKD (chronic kidney disease) stage 5, GFR less than 15 ml/min    carvedilol  6.25 mg Oral BID WC   DULoxetine  60 mg Oral Daily   insulin aspart  0-9 Units Subcutaneous Q4H   sodium zirconium cyclosilicate  10 g Oral Daily    SUBJECTIVE:  Afebrile overnight with no acute events. No new concerns/complaints.   Allergies  Allergen Reactions   Bee Pollen Anaphylaxis   Broccoli [Brassica Oleracea] Anaphylaxis and Hives   Fish-Derived Products Anaphylaxis and Hives   Mushroom Extract Complex Anaphylaxis   Penicillins Anaphylaxis    **Tolerated cefepime March 2021 Did it involve swelling of the face/tongue/throat, SOB, or low BP? Yes Did it involve sudden or severe rash/hives, skin peeling, or any reaction on the inside of your mouth or nose? No Did you need to seek medical attention at a hospital or doctor's office? Yes When did it last happen?  A few months ago If all above answers are "NO", may proceed with cephalosporin use.   Rosemary Oil Anaphylaxis   Shellfish Allergy Anaphylaxis and Hives   Tomato Anaphylaxis and  Hives    Raw tomatoes only, can tolerate if cooked   Acetaminophen Other (See Comments)    GI upset   Aloe Vera Hives   Doxycycline Diarrhea   Acyclovir And Related Other (See Comments)    Unknown reaction   Naproxen Hives     Review of Systems: Review of Systems  Constitutional:  Negative for chills, fever and weight loss.  Respiratory:  Negative for cough, shortness of breath and wheezing.   Cardiovascular:  Negative for chest pain and leg swelling.  Gastrointestinal:  Negative for abdominal pain, constipation, diarrhea, nausea and vomiting.  Musculoskeletal:  Positive for back pain.  Skin:  Negative for rash.      OBJECTIVE: Vitals:   01/31/23 0119 01/31/23 0400 01/31/23 0605 01/31/23 0742  BP: 126/60 (!) 142/62 (!) 150/60 (!) 162/70  Pulse: 71  75 72  Resp: 20 20 19 11   Temp:  98.6 F (37 C) 97.9 F (36.6 C) 98.2 F (36.8 C)  TempSrc:  Oral Oral Oral  SpO2: 97%  94% 97%  Weight:      Height:       Body mass index is 36.56 kg/m.  Physical Exam Constitutional:      General: She is not in acute distress.    Appearance: She is well-developed.  Cardiovascular:     Rate and Rhythm: Normal rate and regular rhythm.     Heart sounds: Normal heart sounds.  Pulmonary:     Effort: Pulmonary  effort is normal.     Breath sounds: Normal breath sounds.  Neurological:     Mental Status: She is alert.  Psychiatric:        Mood and Affect: Mood normal.        Thought Content: Thought content normal.        Judgment: Judgment normal.     Lab Results Lab Results  Component Value Date   WBC 5.1 01/31/2023   HGB 7.8 (L) 01/31/2023   HCT 25.7 (L) 01/31/2023   MCV 92.4 01/31/2023   PLT 150 01/31/2023    Lab Results  Component Value Date   CREATININE 5.34 (H) 01/31/2023   BUN 72 (H) 01/31/2023   NA 132 (L) 01/31/2023   K 5.3 (H) 01/31/2023   CL 110 01/31/2023   CO2 14 (L) 01/31/2023    Lab Results  Component Value Date   ALT 6 01/29/2023   AST 10 (L)  01/29/2023   ALKPHOS 131 (H) 01/29/2023   BILITOT 0.6 01/29/2023     Microbiology: Recent Results (from the past 240 hour(s))  Blood culture (routine x 2)     Status: None (Preliminary result)   Collection Time: 01/28/23  9:00 PM   Specimen: BLOOD  Result Value Ref Range Status   Specimen Description   Final    BLOOD SITE NOT SPECIFIED Performed at [facility], [address] [place], [place] [address]    Special Requests   Final    BOTTLES DRAWN AEROBIC ONLY Blood Culture results may not be optimal due to an inadequate volume of blood received in culture bottles Performed at [facility] [address] [place], [place] [address]    Culture   Final    NO GROWTH 2 DAYS Performed at [facility] [address] [place], [place] [address]    Report Status PENDING  Incomplete  Blood culture (routine x 2)     Status: None (Preliminary result)   Collection Time: 01/28/23  9:10 PM   Specimen: BLOOD  Result Value Ref Range Status   Specimen Description   Final    BLOOD SITE NOT SPECIFIED Performed at [facility] [address], [place], [place] [address]    Special Requests   Final    BOTTLES DRAWN AEROBIC AND ANAEROBIC Blood Culture adequate volume Performed at [facility] [address], [place], [place] [address]    Culture   Final    NO GROWTH 2 DAYS Performed at [facility] [address] [place], [place] [address]    Report Status PENDING  Incomplete  MRSA Next Gen by PCR, Nasal     Status: None   Collection Time: 01/29/23  3:54 AM   Specimen: Nasal Mucosa; Nasal Swab  Result Value Ref Range Status   MRSA by PCR Next Gen NOT DETECTED NOT DETECTED Final    Comment: (NOTE) The GeneXpert MRSA Assay (FDA approved for NASAL specimens only), is one component of a comprehensive MRSA colonization surveillance program. It is not intended to diagnose MRSA infection nor to guide or monitor treatment for  MRSA infections. Test performance is not FDA approved in patients less than 86 years old. Performed at [facility], [place] [address] [place], [name] [address]      Terri Piedra, [facility]  01/31/2023  11:23 AM

## 2023-01-31 NOTE — Addendum Note (Signed)
Addended by: Sanda Linger on: 01/31/2023 01:13 PM   Modules accepted: Orders

## 2023-01-31 NOTE — Progress Notes (Addendum)
Date and time results received: 01/31/23 0400  Test: CBG Critical Value: 68  Name of Provider Notified: Gershon Cull, NP  Actions Taken?: Followed hypoglycemia protocol and administered half of D50 due to patient being NPO at this time.

## 2023-01-31 NOTE — Progress Notes (Signed)
Patient desating to low 80s on 3L nasal cannula. Increased O2 to 4,5,6L nasal cannula and patient still desating to low 80s. Patient stated it hurts to breathe. Called respiratory and placed on non-re breather at 6L. Patient O2 at 100. She refused nebulizer at this time. Paged A. Zebedee Iba, NP. New orders for EKG and chest x-ray.

## 2023-01-31 NOTE — Progress Notes (Signed)
PROGRESS NOTE  [name] [name]  DOB: July 02, 1956  PCP: Ann Held, DO [id number]  DOA: 01/28/2023  LOS: 2 days  Hospital Day: 4  Brief narrative: [name] [name] is a 67 y.o. adult with PMH significant for DM2, HTN, HLD, CAD, noncompliance to meds, chronic diastolic CHF, chronic DVT, CKD V,  liver cirrhosis, anxiety/depression, legally blind, history of L1 osteomyelitis status post L1 corpectomy, also with history of T10-T11 discitis osteomyelitis and underwent posterior arthrodesis T8-T11 and removal of T11 pedicle screws and rod removal in August 2023. 3/31, patient presented to ED for progressively worsening upper/mid back pain, chest pain, fever of 103.   In the ED, vital signs were stable Labs at baseline including negative troponin, D-dimer 2.08 (was 2.32 on labs done 6 weeks ago) Blood culture drawn. CT and MR thoracic spine were obtained.  Imaging showed progressive findings of discitis/osteomyelitis at T4-T5 level with worsened endplate destruction and mild surrounding soft tissue edematous changes, no evidence of cord compression. Neurosurgery was consulted and recommended the need for disc space biopsy.  1 dose of broad-spectrum IV antibiotics was given in the ED but not started on further course of antibiotics while waiting for biopsy. Admitted to [facility] See below for details  Subjective: Patient was seen and examined this morning.  Elderly Caucasian female with lying down in bed.  Somnolent, under the effect of pain medicines.  Husband at bedside Chart reviewed. Afebrile, blood pressure over 160, on 4 L oxygen by nasal cannula Labs from this morning with sodium 132, potassium 5.3, BUN/creatinine 72/5.34, bicarb low at 14, hemoglobin low at 7.8 Earlier this morning, blood glucose level was low at 60s.  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   Normal WBC  count Imagings as above showed progressive osteomyelitis/discitis at the level of T4-T5 4/2, IR tried T4-T5 disc aspiration but was unsuccessful.  Noted a plan to try again today. ID following.  Currently not on antibiotics.  Noted a plan to start on daptomycin following aspiration.  Will need PICC line placement as well. MRI cervical spine plan as well. For pain management, patient is currently on IV Dilaudid as needed, oral oxycodone as needed Recent Labs  Lab 01/28/23 1539 01/29/23 0025 01/30/23 0858 01/31/23 0809  WBC 6.5 7.3 6.9 5.1   Atypical chest pain Chest pain likely due to radiculopathy related to T4-T5 discitis. Troponin negative x 2 and not consistent with ACS  Elevated D-dimer Chronic DVT D-dimer slightly elevated in the setting of chronic DVT with hx of osteomyelitis, but stable since labs done 6 weeks ago PTA supposed to be on Eliquis, unclear compliance In any case, currently not on Eliquis while waiting for biopsy.  H/o CAD/HLD Noncompliant to Eliquis and statin  Chronic diastolic CHF Liver cirrhosis Hypertension Last echo from August 2023 with EF 65 to 70% No signs of volume overload PTA on Coreg 12.5 mg daily???, Lasix 20 mg daily Currently continued on Coreg 6.25 mg twice daily.  Lasix on hold.  Type 2 diabetes mellitus HypOglycemia A1c 4.6 on April 2023 PTA on Lantus 18 units nightly Blood glucose was low at 60s this morning.  Improved with IV dextrose Currently on sliding scale insulin with Accu-Cheks Per peripheral neuropathy, PTA on Lyrica 75 mg twice daily  Recent Labs  Lab 01/31/23 0400 01/31/23 0434 01/31/23 0741 01/31/23 1133 01/31/23 1547  GLUCAP 68*  66* 128* 72 71 60*  AKI on CKD stage V Chronic metabolic acidosis Baseline creatinine less than 4.7. Creatinine running elevated, worse at 5.34 today.  Serum bicarb level also worsening, 14 today. Follows up with nephrologist Dr. Carolin Sicks as an outpatient Recent Labs     11/15/22 1334 12/06/22 1326 12/13/22 1424 12/26/22 1340 01/12/23 1419 01/28/23 1539 01/29/23 0025 01/30/23 0858 01/30/23 1625 01/31/23 0809  BUN 64* 74* 61* 98* 66* 67* 28* 64* 66* 72*  CREATININE 4.23* 4.71* 4.50* 4.87* 4.68* 4.24* 0.88 4.81* 4.93* 5.34*  CO2 21* 17* 17* 18* 20* 18* 16* 15* 16* 14*   Hyperkalemia/hypomagnesemia Potassium and magnesium trend as below. Potassium level improving on Lokelma.  Repeat the same today. Recent Labs  Lab 01/28/23 1539 01/29/23 0025 01/29/23 0327 01/30/23 0858 01/30/23 1625 01/31/23 0809  K 5.6* 4.7  --  6.0* 5.9* 5.3*  MG  --   --  1.6* 2.1  --   --     Anemia of chronic kidney disease Hemoglobin at baseline close to 8.  No active bleeding.  Continue to monitor Recent Labs    07/26/22 1258 08/09/22 1343 09/06/22 1325 09/06/22 1326 09/07/22 1433 10/10/22 1308 11/08/22 1243 11/15/22 1334 12/06/22 1326 12/06/22 1327 12/13/22 1424 01/12/23 1419 01/28/23 1539 01/29/23 0025 01/30/23 0858 01/31/23 0809  HGB 8.0*   < > 10.0*  --  10.5*   < > 9.9*   < > 8.9*  --    < > 8.3* 8.6* 8.2* 8.2* 7.8*  MCV 90.7   < > 88.7  --  86.7   < > 91.2   < > 95.5  --    < > 93.3 92.6 94.9 92.8 92.4  VITAMINB12  --   --   --   --  273  --   --   --   --   --   --   --   --   --   --   --   FERRITIN 394*  --   --  401*  --   --  258  --   --  209  --  277  --   --   --   --   TIBC 232*  --  258  --   --   --  256  --  269  --   --  263  --   --   --   --   IRON 64  --  69  --   --   --  53  --  46  --   --  32  --   --   --   --   RETICCTPCT 2.2  --   --  1.4  --   --  2.1  --   --  2.1  --  2.6  --   --   --   --    < > = values in this interval not displayed.   Morbid Obesity  Body mass index is 36.56 kg/m. Patient has been advised to make an attempt to improve diet and exercise patterns to aid in weight loss.  Anxiety/Depression PTA on Cymbalta 60 mg daily Valium as needed   Mobility: Needs PT eval  Goals of care   Code Status: Full  Code     DVT prophylaxis: Start SCD Place and maintain sequential compression device Start: 01/31/23 1558   Antimicrobials: Not on antibiotics currently Fluid: D5 NS at 75 mill per hour Consultants: ID, IR, neurosurgery  Family Communication: Husband at bedside  Status: Inpatient Level of care:  Med-Surg   Needs to continue in-hospital care:  Pending biopsy by IR  Patient from: Home Anticipated d/c to: Pending clinical course   Diet:  Diet Order             Diet NPO time specified Except for: Sips with Meds  Diet effective midnight           Diet renal/carb modified with fluid restriction Diet-HS Snack? Nothing; Fluid restriction: 1200 mL Fluid; Room service appropriate? Yes; Fluid consistency: Thin  Diet effective now                   Scheduled Meds:  carvedilol  6.25 mg Oral BID WC   DULoxetine  60 mg Oral Daily   insulin aspart  0-9 Units Subcutaneous Q4H    PRN meds: acetaminophen, albuterol, HYDROmorphone (DILAUDID) injection, naLOXone (NARCAN)  injection, mouth rinse, oxyCODONE   Infusions:   dextrose 5 % and 0.9% NaCl 50 mL/hr at 01/31/23 1150    Antimicrobials: Anti-infectives (From admission, onward)    Start     Dose/Rate Route Frequency Ordered Stop   01/28/23 1945  vancomycin (VANCOREADY) IVPB 1500 mg/300 mL  Status:  Discontinued        1,500 mg 150 mL/hr over 120 Minutes Intravenous  Once 01/28/23 1931 01/28/23 2120   01/28/23 1945  cefTRIAXone (ROCEPHIN) 2 g in sodium chloride 0.9 % 100 mL IVPB  Status:  Discontinued        2 g 200 mL/hr over 30 Minutes Intravenous  Once 01/28/23 1931 01/28/23 2120       Nutritional status:  Body mass index is 36.56 kg/m.          Objective: Vitals:   01/31/23 0742 01/31/23 1135  BP: (!) 162/70 (!) 162/68  Pulse: 72 67  Resp: 11 11  Temp: 98.2 F (36.8 C) 97.7 F (36.5 C)  SpO2: 97% 97%    Intake/Output Summary (Last 24 hours) at 01/31/2023 1559 Last data filed at 01/31/2023 1554 Gross  per 24 hour  Intake 323.33 ml  Output --  Net 323.33 ml   Filed Weights   01/28/23 1450 01/29/23 0116  Weight: 88.5 kg 82.1 kg   Weight change:  Body mass index is 36.56 kg/m.   Physical Exam: General exam: Pleasant, elderly Caucasian female.  Not in distress Skin: No rashes, lesions or ulcers. HEENT: Atraumatic, normocephalic, no obvious bleeding Lungs: Clear to auscultation bilaterally CVS: Regular rate and rhythm, no murmur GI/Abd soft, nontender, nondistended, bowels are present CNS: Somnolent, opens eyes on verbal command, answers questions appropriately Psychiatry: Sad affect Extremities: No pedal edema, no calf tenderness  Data Review: I have personally reviewed the laboratory data and studies available.  F/u labs ordered Unresulted Labs (From admission, onward)     Start     Ordered   01/30/23 1347  Aerobic/Anaerobic Culture w Gram Stain (surgical/deep wound)  Once,   R        01/30/23 1347   01/30/23 0500  CBC with Differential/Platelet  Daily,   R      01/29/23 1624   01/30/23 [id number]  Basic metabolic panel  Daily,   R      01/29/23 1624            Total time spent in review of labs and imaging, patient evaluation, formulation of plan, documentation and communication with family: 64 minutes  Signed, Terrilee Croak, MD [facility]  01/31/2023  

## 2023-01-31 NOTE — Progress Notes (Signed)
       Overnight   NAME: [name] [name] MRN: [id number] DOB : Feb 04, 1956    Date of Service   01/31/2023   HPI/Events of Note   Notified by RN for low CBG    Interventions/ Plan   Following Hypoglycemia protocol due NPO status for procedure. Reassessment ongoing.       Gershon Cull BSN MSNA MSN ACNPC-AG Acute Care Nurse Practitioner [place]

## 2023-01-31 NOTE — Telephone Encounter (Signed)
Prescription Request  01/31/2023  Is this a "Controlled Substance" medicine? Yes  LOV: 09/07/2022  What is the name of the medication or equipment?  HYDROcodone-acetaminophen (NORCO/VICODIN)   Have you contacted your pharmacy to request a refill? No   Which pharmacy would you like this sent to?  [facility] [place], [place] - [address] [place] [address] Phone: [phone] Fax: [phone]   Patient notified that their request is being sent to the clinical staff for review and that they should receive a response within 2 business days.   Please advise at [place]

## 2023-01-31 NOTE — Telephone Encounter (Signed)
Evenity sent through specialty pharmacy and was a zero copay.  They will call to get it set up to send to our office.

## 2023-01-31 NOTE — Telephone Encounter (Signed)
Requesting: NORCO Contract: 02/14/2022 UDS: 02/14/2022 Last OV: 12/13/22 w/ Lovena Le Next OV: N/A Last Refill: 01/01/23, #120--0 RF Database:   Please advise

## 2023-01-31 NOTE — Progress Notes (Addendum)
    Patient Name: [name] [name]           DOB: 15-Oct-1956  MRN: [id number]      Admission Date: 01/28/2023  Attending Provider: Terrilee Croak, MD  Primary Diagnosis: Vertebral osteomyelitis   Level of care: Med-Surg    CROSS COVER NOTE   Date of Service   01/31/2023   [name], 67 y.o. adult, was admitted on 01/28/2023 for Vertebral osteomyelitis.    HPI/Events of Note   RN reports hypoxic episode, O2 80s on 3 L nasal cannula.  Patient complaining of chest discomfort, "hurts to breathe." Chest discomfort was alleviated after patient was placed on 6 L 02.    EKG and chest x-ray ordered. 12-lead EKG interpreted as NSR without ST segment change.  Chest x-ray-hypoventilation, atelectasis.  Continue incentive spirometry use.    Interventions/ Plan   EKG Chest x-ray Neb PRN-patient refusing it        Raenette Rover, DNP, [place]

## 2023-01-31 NOTE — Plan of Care (Signed)
Patient presented to IR for repeat attempt at T4-5 disc aspiration via transpedicular approach with moderate sedation.  Patient seen in IR holding area, she is able to tell me the procedure she is having done today and is otherwise oriented but is excessively somnolent. After signing consent the patient was brought to the IR suite for procedure where she remained somnolent but was unable to stay awake for any length of time, she stated to IR physician that she felt the pain medications have affected her ability to remain awake and she does not think she wants to have the procedure today.  Procedure deferred per patient request and questionable ability to give informed consent given excessive somnolence after receiving medications on the floor. Will attempt procedure again tomorrow and have patient sign new informed consent if somnolence has improved. Patient to be NPO at midnight on 4/4 for moderate sedation.  Candiss Norse, PA-C

## 2023-01-31 NOTE — Progress Notes (Signed)
CBG 128 15 mins after giving half of D50.

## 2023-02-01 ENCOUNTER — Inpatient Hospital Stay (HOSPITAL_COMMUNITY): Payer: PRIVATE HEALTH INSURANCE

## 2023-02-01 ENCOUNTER — Other Ambulatory Visit: Payer: Self-pay

## 2023-02-01 ENCOUNTER — Ambulatory Visit: Admit: 2023-02-01 | Discharge: 2023-02-02 | Payer: MEDICARE

## 2023-02-01 ENCOUNTER — Encounter: Admit: 2023-02-01 | Discharge: 2023-02-01 | Payer: MEDICARE

## 2023-02-01 DIAGNOSIS — M4620 Osteomyelitis of vertebra, site unspecified: Secondary | ICD-10-CM | POA: Diagnosis not present

## 2023-02-01 LAB — CBC WITH DIFFERENTIAL/PLATELET
Abs Immature Granulocytes: 0.01 10*3/uL (ref 0.00–0.07)
Basophils Absolute: 0 10*3/uL (ref 0.0–0.1)
Basophils Relative: 1 %
Eosinophils Absolute: 0.2 10*3/uL (ref 0.0–0.5)
Eosinophils Relative: 4 %
HCT: 25.2 % — ABNORMAL LOW (ref 36.0–46.0)
Hemoglobin: 7.7 g/dL — ABNORMAL LOW (ref 12.0–15.0)
Immature Granulocytes: 0 %
Lymphocytes Relative: 18 %
Lymphs Abs: 0.8 10*3/uL (ref 0.7–4.0)
MCH: 27.6 pg (ref 26.0–34.0)
MCHC: 30.6 g/dL (ref 30.0–36.0)
MCV: 90.3 fL (ref 80.0–100.0)
Monocytes Absolute: 0.5 10*3/uL (ref 0.1–1.0)
Monocytes Relative: 10 %
Neutro Abs: 3.1 10*3/uL (ref 1.7–7.7)
Neutrophils Relative %: 67 %
Platelets: 158 10*3/uL (ref 150–400)
RBC: 2.79 MIL/uL — ABNORMAL LOW (ref 3.87–5.11)
RDW: 14.9 % (ref 11.5–15.5)
WBC: 4.6 10*3/uL (ref 4.0–10.5)
nRBC: 0 % (ref 0.0–0.2)

## 2023-02-01 LAB — BASIC METABOLIC PANEL
Anion gap: 12 (ref 5–15)
BUN: 71 mg/dL — ABNORMAL HIGH (ref 8–23)
CO2: 16 mmol/L — ABNORMAL LOW (ref 22–32)
Calcium: 8.1 mg/dL — ABNORMAL LOW (ref 8.9–10.3)
Chloride: 111 mmol/L (ref 98–111)
Creatinine, Ser: 5.48 mg/dL — ABNORMAL HIGH (ref 0.44–1.00)
GFR, Estimated: 8 mL/min — ABNORMAL LOW (ref >60)
Glucose, Bld: 75 mg/dL (ref 70–99)
Potassium: 5.1 mmol/L (ref 3.5–5.1)
Sodium: 139 mmol/L (ref 135–145)

## 2023-02-01 LAB — GLUCOSE, CAPILLARY
Glucose-Capillary: 91 mg/dL (ref 70–99)
Glucose-Capillary: 72 mg/dL (ref 70–99)
Glucose-Capillary: 83 mg/dL (ref 70–99)
Glucose-Capillary: 78 mg/dL (ref 70–99)
Glucose-Capillary: 106 mg/dL — ABNORMAL HIGH (ref 70–99)
Glucose-Capillary: 86 mg/dL (ref 70–99)

## 2023-02-01 MED ORDER — HYDROCODONE-ACETAMINOPHEN 5-325 MG PO TABS
1.0000 | ORAL_TABLET | Freq: Four times a day (QID) | ORAL | 0 refills | Status: DC | PRN
Start: 2023-02-01 — End: 2023-02-03

## 2023-02-01 NOTE — Progress Notes (Signed)
Removed non- re breather and placed nasal cannula with 4L. Patient O2 is 99.

## 2023-02-01 NOTE — Progress Notes (Signed)
[name], adult   DOB: Dec 09, 1955, 67 y.o.   MRN: [id number]  Reached out to IR to see what time procedure will be. They are unsure at this time and state patient may have to re-consent since she declined yesterday. Will continue to monitor and keep NPO for possible biopsy.   Haydee Salter, RN

## 2023-02-01 NOTE — Progress Notes (Signed)
PROGRESS NOTE  [name] [name]  DOB: Nov 18, 1955  PCP: Ann Held, DO [id number]  DOA: 01/28/2023  LOS: 3 days  Hospital Day: 5  Brief narrative: [name] [name] is a 67 y.o. adult with PMH significant for DM2, HTN, HLD, CAD, noncompliance to meds, chronic diastolic CHF, chronic DVT, CKD V,  liver cirrhosis, anxiety/depression, legally blind, history of L1 osteomyelitis status post L1 corpectomy, also with history of T10-T11 discitis osteomyelitis and underwent posterior arthrodesis T8-T11 and removal of T11 pedicle screws and rod removal in August 2023. 3/31, patient presented to ED for progressively worsening upper/mid back pain, chest pain, fever of 103.   In the ED, vital signs were stable Labs at baseline including negative troponin, D-dimer 2.08 (was 2.32 on labs done 6 weeks ago) Blood culture drawn. CT and MR thoracic spine were obtained.  Imaging showed progressive findings of discitis/osteomyelitis at T4-T5 level with worsened endplate destruction and mild surrounding soft tissue edematous changes, no evidence of cord compression. Neurosurgery was consulted and recommended the need for disc space biopsy.  1 dose of broad-spectrum IV antibiotics was given in the ED but not started on further course of antibiotics while waiting for biopsy. Admitted to [facility] See below for details  Subjective: Patient was seen and examined this morning.   Sitting up in recliner.  Not in distress. Could not get biopsy done yesterday because of somnolence.  More awake today.    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   Normal WBC count Imagings as above showed progressive osteomyelitis/discitis at the level of T4-T5.  4/2, IR tried T4-T5 disc aspiration but was unsuccessful. Noted a plan to try again today. Could not get biopsy done yesterday because of somnolence.  More awake  today.   ID following.  Currently not on antibiotics.  Noted a plan to start on daptomycin following aspiration.  Will need PICC line placement as well. MRI cervical spine plan as well. For pain management, patient is currently on IV Dilaudid as needed, oral oxycodone as needed Recent Labs  Lab 01/28/23 1539 01/29/23 0025 01/30/23 0858 01/31/23 0809 02/01/23 0622  WBC 6.5 7.3 6.9 5.1 4.6   Atypical chest pain Chest pain likely due to radiculopathy related to T4-T5 discitis. Troponin negative x 2 and not consistent with ACS  Elevated D-dimer Chronic DVT D-dimer slightly elevated in the setting of chronic DVT with hx of osteomyelitis, but stable since labs done 6 weeks ago PTA supposed to be on Eliquis, unclear compliance In any case, currently not on Eliquis while waiting for biopsy.  H/o CAD/HLD Noncompliant to Eliquis and statin  Chronic diastolic CHF Liver cirrhosis Hypertension Last echo from August 2023 with EF 65 to 70% No signs of volume overload PTA on Coreg 12.5 mg daily???, Lasix 20 mg daily Currently continued on Coreg 6.25 mg twice daily.  Lasix on hold.  Continue to hold since he has poor oral intake.  Type 2 diabetes mellitus HypOglycemia A1c 4.6 on April 2023 PTA on Lantus 18 units nightly Blood glucose was low at 60s on 4/3.  Currently getting D5 NS at 50 mill per hour Currently on sliding scale insulin with Accu-Cheks Per peripheral neuropathy, PTA on Lyrica 75 mg twice daily  Recent Labs  Lab 01/31/23 1934 01/31/23 2326 02/01/23 0311 02/01/23 0742 02/01/23 1144  GLUCAP 78 86 91 72 83   AKI on CKD stage V Chronic metabolic  acidosis Baseline creatinine less than 4.7. Creatinine running elevated, worse at 5.48 today.  Expect improvement with hydration.  Serum bicarb level is also low, 16 today. Follows up with nephrologist Dr. Carolin Sicks as an outpatient Recent Labs    12/06/22 1326 12/13/22 1424 12/26/22 1340 01/12/23 1419 01/28/23 1539  01/29/23 0025 01/30/23 0858 01/30/23 1625 01/31/23 0809 02/01/23 0622  BUN 74* 61* 98* 66* 67* 28* 64* 66* 72* 71*  CREATININE 4.71* 4.50* 4.87* 4.68* 4.24* 0.88 4.81* 4.93* 5.34* 5.48*  CO2 17* 17* 18* 20* 18* 16* 15* 16* 14* 16*   Hyperkalemia/hypomagnesemia Potassium and magnesium trend as below. Potassium level improving on Lokelma.  Repeat the same today. Recent Labs  Lab 01/29/23 0025 01/29/23 0327 01/30/23 0858 01/30/23 1625 01/31/23 0809 02/01/23 0622  K 4.7  --  6.0* 5.9* 5.3* 5.1  MG  --  1.6* 2.1  --   --   --     Anemia of chronic kidney disease Hemoglobin at baseline close to 8.  No active bleeding.  Continue to monitor Recent Labs    07/26/22 1258 08/09/22 1343 09/06/22 1325 09/06/22 1326 09/07/22 1433 10/10/22 1308 11/08/22 1243 11/15/22 1334 12/06/22 1326 12/06/22 1327 12/13/22 1424 01/12/23 1419 01/28/23 1539 01/29/23 0025 01/30/23 0858 01/31/23 0809 02/01/23 0622  HGB 8.0*   < > 10.0*  --  10.5*   < > 9.9*   < > 8.9*  --    < > 8.3* 8.6* 8.2* 8.2* 7.8* 7.7*  MCV 90.7   < > 88.7  --  86.7   < > 91.2   < > 95.5  --    < > 93.3 92.6 94.9 92.8 92.4 90.3  VITAMINB12  --   --   --   --  273  --   --   --   --   --   --   --   --   --   --   --   --   FERRITIN 394*  --   --  401*  --   --  258  --   --  209  --  277  --   --   --   --   --   TIBC 232*  --  258  --   --   --  256  --  269  --   --  263  --   --   --   --   --   IRON 64  --  69  --   --   --  53  --  46  --   --  32  --   --   --   --   --   RETICCTPCT 2.2  --   --  1.4  --   --  2.1  --   --  2.1  --  2.6  --   --   --   --   --    < > = values in this interval not displayed.   Morbid Obesity  Body mass index is 36.56 kg/m. Patient has been advised to make an attempt to improve diet and exercise patterns to aid in weight loss.  Anxiety/Depression PTA on Cymbalta 60 mg daily Valium as needed   Mobility: Needs PT eval  Goals of care   Code Status: Full Code     DVT  prophylaxis: Start SCD Place and maintain sequential compression device Start: 01/31/23 1558  Antimicrobials: Not on antibiotics currently Fluid: D5 NS at 50 mill per hour Consultants: ID, IR, neurosurgery Family Communication: Husband at bedside  Status: Inpatient Level of care:  Med-Surg   Needs to continue in-hospital care:  Pending biopsy by IR  Patient from: Home Anticipated d/c to: Pending clinical course   Diet:  Diet Order             Diet NPO time specified Except for: Sips with Meds  Diet effective midnight                   Scheduled Meds:  carvedilol  6.25 mg Oral BID WC   DULoxetine  60 mg Oral Daily   insulin aspart  0-9 Units Subcutaneous Q4H    PRN meds: acetaminophen, albuterol, HYDROmorphone (DILAUDID) injection, naLOXone (NARCAN)  injection, mouth rinse, oxyCODONE   Infusions:   dextrose 5 % and 0.9% NaCl 50 mL/hr at 01/31/23 1150    Antimicrobials: Anti-infectives (From admission, onward)    Start     Dose/Rate Route Frequency Ordered Stop   01/28/23 1945  vancomycin (VANCOREADY) IVPB 1500 mg/300 mL  Status:  Discontinued        1,500 mg 150 mL/hr over 120 Minutes Intravenous  Once 01/28/23 1931 01/28/23 2120   01/28/23 1945  cefTRIAXone (ROCEPHIN) 2 g in sodium chloride 0.9 % 100 mL IVPB  Status:  Discontinued        2 g 200 mL/hr over 30 Minutes Intravenous  Once 01/28/23 1931 01/28/23 2120       Nutritional status:  Body mass index is 36.56 kg/m.          Objective: Vitals:   02/01/23 1000 02/01/23 1106  BP:  (!) 102/48  Pulse:  (!) 52  Resp:  12  Temp:  97.7 F (36.5 C)  SpO2: 99% 99%    Intake/Output Summary (Last 24 hours) at 02/01/2023 1259 Last data filed at 01/31/2023 1630 Gross per 24 hour  Intake 323.33 ml  Output --  Net 323.33 ml   Filed Weights   01/28/23 1450 01/29/23 0116  Weight: 88.5 kg 82.1 kg   Weight change:  Body mass index is 36.56 kg/m.   Physical Exam: General exam: Pleasant,  elderly Caucasian female.  Not in distress Skin: No rashes, lesions or ulcers. HEENT: Atraumatic, normocephalic, no obvious bleeding Lungs: Clear to auscultation bilaterally CVS: Regular rate and rhythm, no murmur GI/Abd soft, nontender, nondistended, bowels are present CNS: More awake today.  Able to answer questions appropriately Psychiatry: Sad affect Extremities: No pedal edema, no calf tenderness  Data Review: I have personally reviewed the laboratory data and studies available.  F/u labs ordered Unresulted Labs (From admission, onward)     Start     Ordered   01/30/23 1347  Aerobic/Anaerobic Culture w Gram Stain (surgical/deep wound)  Once,   R        01/30/23 1347   01/30/23 0500  CBC with Differential/Platelet  Daily,   R      01/29/23 1624   01/30/23 [id number]  Basic metabolic panel  Daily,   R      01/29/23 1624            Total time spent in review of labs and imaging, patient evaluation, formulation of plan, documentation and communication with family: 43 minutes  Signed, Terrilee Croak, MD [facility] 02/01/2023

## 2023-02-01 NOTE — Evaluation (Signed)
Physical Therapy Evaluation Patient Details Name: [name] [name] MRN: [id number] DOB: 04/09/1956 Today's Date: 02/01/2023  History of Present Illness  67 yo female presents to [facility] on 3/31 with L scapular and spinal pain x2 days, n/v. 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 T4-5 disc aspiration on 4/4 not completed given somnolence with CXR showing hypoventilation and atelectasis. PMH significant for spinal surgery most recently 05/2022 T11-L3 revision and T9-11 extension of fusion, chronic diastolic HF, CKD stage IV, renal transplant, chronic DVTs, obesity, liver cirrhosis, CAD s/p MI and cardiac cath, DMII with neuropathy, R partial foot amputation 2014, and blindness.  Clinical Impression   Pt presents with generalized weakness R>L, impaired balance, back pain, and decreased activity tolerance. Pt to benefit from acute PT to address deficits. Pt ambulated short room distance with use of RW and min PT physical assist, pt desatting to 85% on RA so placed back on 4LO2 post-gait. Pt receiving HHPT services PTA, and has assist of husband at home. PT to progress mobility as tolerated, and will continue to follow acutely.         Recommendations for follow up therapy are one component of a multi-disciplinary discharge planning process, led by the attending physician.  Recommendations may be updated based on patient status, additional functional criteria and insurance authorization.  Follow Up Recommendations       Assistance Recommended at Discharge Frequent or constant Supervision/Assistance  Patient can return home with the following  A little help with walking and/or transfers;A little help with bathing/dressing/bathroom    Equipment Recommendations None recommended by PT  Recommendations for Other Services       Functional Status Assessment Patient has had a recent decline in their functional status and demonstrates the ability to make significant improvements in function in  a reasonable and predictable amount of time.     Precautions / Restrictions Precautions Precautions: Fall Restrictions Weight Bearing Restrictions: No      Mobility  Bed Mobility Overal bed mobility: Needs Assistance Bed Mobility: Supine to Sit     Supine to sit: Min assist     General bed mobility comments: log roll technique, assist for trunk rise    Transfers Overall transfer level: Needs assistance Equipment used: Rolling walker (2 wheels) Transfers: Sit to/from Stand Sit to Stand: Min assist           General transfer comment: assist for rise and steady    Ambulation/Gait Ambulation/Gait assistance: Min assist Gait Distance (Feet): 8 Feet Assistive device: Rolling walker (2 wheels) Gait Pattern/deviations: Step-through pattern, Decreased stride length, Trunk flexed Gait velocity: decr     General Gait Details: assist to steady, guide RW, correct posterior bias  Stairs            Wheelchair Mobility    Modified Rankin (Stroke Patients Only)       Balance Overall balance assessment: Needs assistance, History of Falls Sitting-balance support: No upper extremity supported, Feet supported Sitting balance-Leahy Scale: Fair     Standing balance support: Bilateral upper extremity supported, During functional activity Standing balance-Leahy Scale: Fair                               Pertinent Vitals/Pain Pain Assessment Pain Assessment: Faces Faces Pain Scale: Hurts little more Pain Location: back, during bed mobility Pain Descriptors / Indicators: Sore, Discomfort Pain Intervention(s): Limited activity within patient's tolerance, Monitored during session, Repositioned    Home Living  Family/patient expects to be discharged to:: Private residence Living Arrangements: Spouse/significant other Available Help at Discharge: Family;Available PRN/intermittently Type of Home: House Home Access: Ramped entrance       Home Layout:  One level Home Equipment: Wheelchair - Multimedia programmer (2 wheels) Additional Comments: husband helps her walk    Prior Function Prior Level of Function : Independent/Modified Independent;Needs assist (no history of falls)  Cognitive Assist : ADLs (cognitive)     Physical Assist : ADLs (physical)   ADLs (physical): IADLs Mobility Comments: no driving, pt's husband assists with gait as needed . Pt ambulatory for short distances with RW, otherwise uses w/c ADLs Comments: able to dress and bathe self, husband cooks/cleans     Hand Dominance   Dominant Hand: Left    Extremity/Trunk Assessment   Upper Extremity Assessment Upper Extremity Assessment: Defer to OT evaluation    Lower Extremity Assessment Lower Extremity Assessment: Generalized weakness (grossly 4/5 LLE, grossly 3+/5 RLE and pt states RLE is typically weaker)    Cervical / Trunk Assessment Cervical / Trunk Assessment: Kyphotic  Communication   Communication: No difficulties  Cognition Arousal/Alertness: Awake/alert (periods of drowsiness suspect due to meds) Behavior During Therapy: WFL for tasks assessed/performed Overall Cognitive Status: Within Functional Limits for tasks assessed                                          General Comments General comments (skin integrity, edema, etc.): leaking from L PIV, RN notified. on 4LO2 via [place] upon arrival satting 98%, SPO2 drop to 85% on RA, placed back on 4LO2    Exercises     Assessment/Plan    PT Assessment Patient needs continued PT services  PT Problem List Decreased strength;Decreased mobility;Decreased activity tolerance;Decreased balance;Decreased knowledge of use of DME;Pain       PT Treatment Interventions DME instruction;Therapeutic activities;Gait training;Therapeutic exercise;Patient/family education;Balance training;Stair training;Functional mobility training;Neuromuscular re-education    PT Goals (Current goals can  be found in the Care Plan section)  Acute Rehab PT Goals PT Goal Formulation: With patient Time For Goal Achievement: 02/15/23 Potential to Achieve Goals: Good    Frequency Min 3X/week     Co-evaluation               AM-PAC PT "6 Clicks" Mobility  Outcome Measure Help needed turning from your back to your side while in a flat bed without using bedrails?: A Little Help needed moving from lying on your back to sitting on the side of a flat bed without using bedrails?: A Little Help needed moving to and from a bed to a chair (including a wheelchair)?: A Little Help needed standing up from a chair using your arms (e.g., wheelchair or bedside chair)?: A Little Help needed to walk in hospital room?: A Little Help needed climbing 3-5 steps with a railing? : A Little 6 Click Score: 18    End of Session Equipment Utilized During Treatment: Oxygen;Gait belt Activity Tolerance: Patient tolerated treatment well Patient left: in chair;with call bell/phone within reach;with chair alarm set Nurse Communication: Mobility status PT Visit Diagnosis: Other abnormalities of gait and mobility (R26.89);Muscle weakness (generalized) (M62.81)    Time: [id number] PT Time Calculation (min) (ACUTE ONLY): 33 min   Charges:   PT Evaluation $PT Eval Low Complexity: 1 Low PT Treatments $Therapeutic Activity: 8-22 mins        Cherrelle Plante S,  PT DPT Acute Rehabilitation Services Pager [phone]  Office [phone]   Louis Matte 02/01/2023, 12:23 PM

## 2023-02-01 NOTE — Progress Notes
A follow-up comprehensive medication management visit was completed today via telephone.    Referral reason: Hypertension Management and [facility]  Referring provider: Dr. Sherlyn Hay, MD    [facility] enrollment date: 10/02/2022  [facility] study end date: 10/02/2024  Study coordinator: Shelbie Ammons    Assessment & Plan     Hypertension  Patient's blood pressure is uncontrolled per Qardio report. They have had a microalbumin completed. They have normal-mild albuminuria present. Their goal blood pressure is < 130/80 mmHg based on 2018 ACC/AHA guidelines.    Last clinic BP reading on 3/29: 127/67 mmHg    [facility]io Home Blood Pressure Readings:  1 month average: 141/85 mmHg, HR: 73 --> 139/87 mmHg, HR: 78  3 month average: 142/86 mmHg, HR: 72    Today, Rhonda Fletcher reports her home blood pressures the past couple days have been higher due to stress with neighbors.  She notes she has continued going to the gym. Encouraged patient to continue with exercise! She also notes she has not been consistently taking her irbesartan due to her medications being stolen. Due to last BP being within goal at last appointment with Dr. Philomena Course the irbesartan was not re-started. Qardio blood pressure machines do tend to run a little higher. Home BP reading on 3/29 was 145/91 mmHg (prior to meds). Therefore, we will resume BID blood pressure readings 3-4 times per week and if BP remains elevated we discussed re-starting irbesartan at 150 mg daily due to not being on the medication for a couple months.     Plan  START:  Checking blood pressure in the morning and evening every other day (around 3-4 times per week)    CONTINUE:  Amlodipine 10mg  daily in the morning     Carvedilol 25mg  po twice daily with breakfast and dinner     Ensure resting 5-10 minutes before blood pressure reading     Limiting foods high in salt such as fried chicken (has about 500 mg of sodium per 3 oz serving)    Going to the gym about 3-4 days per week for about 6-8 hours per week    Future considerations:  Re-starting irbesartan at 150 mg by mouth daily with repeat BMP 3-4 weeks after initiation for safety  Patient does report she has clonidine patches at home. However, she does not wish to re-start these at this time  Initiating therapy such as chlorthalidone. Discussed with Dr. Philomena Course who agrees with this plan if needed and ensuring follow-up labs. Appreciate Dr. Vanice Sarah input!      Follow-up  The patient will continue to follow up with the pharmacist. Return to pharmacist in 3 weeks via telephone. The return visit was scheduled during today's visit.    Subjective & Objective      [name] was referred to the pharmacy team for hypertension management. She does report dizziness all the time. Ms. Talissa reports ensuring she is resting prior to taking blood pressure. Patient does note her neighbor contributes to a higher stress level. She has eliminated foods like fried chicken that tend to have a higher sodium amount from her diet.    Patient reports she did try a couple clonidine patches for a couple days and they made her feel sluggish so she has not been utilizing them for blood pressure control. She does not recall what her blood pressures were reading while utilizing the clonidine patches.     Hypertension    HPI  Current hypertension medication regimen:  Irbesartan to 300mg  daily (maximum dose) -HOLD  Amlodipine 10mg  daily (maximum dose)  Carvedilol 25mg  po twice daily (maximum dose)  Previous medications for hypertension: Hctz (nausea/vomiting), spironolactone (nausea/vomiting), hydralazine (burned stomach), clonidine (felt sluggish)    The baseline blood pressure was 143/75 on 08/31/2022.    Lifestyle  Physical activity/exercise: yes. Tries to go to the gym. Wants to go to the gym 3-4 times per week in the new year.  Adds salt to food: no. sometimes add seasoning salt  Caffeine consumption: yes. 1-2 cups of coffee each morning  Elevated stress: yes. neighbor does cause stress  Alcohol use: yes. 1 glass of wine every once in a while  Tobacco use: no    Monitoring  Patient instructed to check blood pressure 3 time(s) per week.  Cuff type: Qardio  Appropriate technique: yes    Symptom Review  Hypotension: no  Chest pain: no  Shortness of breath: no  Lower extremity edema: no. High blood pressure symptoms include lightheadedness, headache, change in vision    Comorbidities  ASCVD condition(s): none  CKD: yes  Diabetes: yes (type 2 and uncontrolled)  Dyslipidemia: yes    Health Maintenance  Aspirin utilization: Patient is not taking aspirin due to the following reason(s): not indicated.  Statin utilization: Patient is not taking a statin.    [facility]io Blood Pressure Readings:        Primary Care - Labs  BP Readings from Last 3 Encounters:   01/26/23 127/67   12/26/22 (!) 154/86   08/31/22 (!) 143/75     Pulse Readings from Last 3 Encounters:   01/26/23 72   12/26/22 77   08/31/22 63     Comprehensive Metabolic Profile    Lab Results   Component Value Date/Time    NA 136 (L) 01/26/2023 09:30 AM    K 4.6 01/26/2023 09:30 AM    CL 104 01/26/2023 09:30 AM    CO2 19 (L) 01/26/2023 09:30 AM    GAP 13 (H) 01/26/2023 09:30 AM    BUN 25 01/26/2023 09:30 AM    CR 1.90 (H) 01/26/2023 09:30 AM    GLU 312 (H) 01/26/2023 09:30 AM    GLU 146 (H) 02/21/2018 09:45 AM    Lab Results   Component Value Date/Time    CA 9.5 01/26/2023 09:30 AM    PO4 3.3 01/24/2023 08:44 AM    ALBUMIN 4.1 07/25/2022 12:07 PM    TOTPROT 7.4 07/25/2022 12:07 PM    ALKPHOS 82 07/25/2022 12:07 PM    AST 13 07/25/2022 12:07 PM    ALT 12 07/25/2022 12:07 PM    TOTBILI 0.5 07/25/2022 12:07 PM    GFR 33 (L) 08/24/2020 09:30 AM    GFRAA 40 (L) 08/24/2020 09:30 AM        eGFR   Date Value Ref Range Status   01/26/2023 29 (L) >60 mL/min Final     Comment:     eGFR calculated using the CKD-EPIcr_R equation     Lab Results   Component Value Date/Time    URMALBCRRAT 30.53 (H) 01/24/2023 08:49 AM     BMI Readings from Last 1 Encounters:   01/26/23 27.99 kg/m?     The 10-year ASCVD risk score (Arnett DK, et al., 2019) is: 24%    Values used to calculate the score:      Age: 41 years      Sex: Female      Is Non-Hispanic African American: Yes  Diabetic: Yes      Tobacco smoker: No      Systolic Blood Pressure: 127 mmHg      Is BP treated: Yes      HDL Cholesterol: 97 MG/DL      Total Cholesterol: 232 MG/DL     Medication History  Medication history was not completed because previously completed (follow-up visit).    Adverse Drug Reactions    Side effect(s) were not reported.    Refills needed: no    Home Medications    Medication Sig   albuterol sulfate (PROAIR HFA) 90 mcg/actuation HFA aerosol inhaler Inhale two puffs by mouth into the lungs every 6 hours as needed.   alcohol swabs (DROPSAFE ALCOHOL PREP PADS) USE TO CLEAN SKIN BEFORE FINGERSTICK   amLODIPine (NORVASC) 10 mg tablet TAKE 1 TABLET EVERY DAY   azelastine (ASTELIN) 137 mcg (0.1 %) nasal spray Apply two sprays to each nostril as directed twice daily.   betamethasone valerate (VALISONE) 0.1 % topical cream APPLY TOPICALLY TO AFFECTED AREA DAILY   Blood Glucose Control, Normal soln Use in glucose meter, Accu-check Aviva   blood sugar diagnostic (ACCU-CHEK AVIVA PLUS TEST STRP) test strip Use one strip as directed daily before breakfast. 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   carvediloL (COREG) 25 mg tablet TAKE ONE TABLET BY MOUTH TWICE DAILY WITH MEALS. TAKE WITH FOOD.   cetirizine (ZYRTEC) 10 mg tablet Take one tablet by mouth daily.     cholecalciferol(+) (VITAMIN D-3) 2,000 unit tablet Take 1 Tab by mouth daily.  Patient taking differently: Take one tablet by mouth daily. 5,000 units daily   cyanocobalamin (vitamin B-12) 5,000 mcg cap Take 1 tablet by mouth daily.   cyclobenzaprine (FLEXERIL) 5 mg tablet TAKE 1 TABLET AT BEDTIME   ezetimibe (ZETIA) 10 mg tablet Take one tablet by mouth daily.   famotidine (PEPCID) 20 mg tablet TAKE 1 TABLET TWICE DAILY   ferrous sulfate (FEOSOL) 325 mg (65 mg iron) tablet Take one tablet by mouth every 48 hours.   fluconazole (DIFLUCAN) 150 mg tablet Take one tablet by mouth daily.   fluticasone propionate (FLONASE) 50 mcg/actuation nasal spray, suspension USE 2 SPRAYS IN EACH NOSTRIL AS DIRECTED DAILY. SHAKE BOTTLE GENTLY BEFORE USING   glimepiride (AMARYL) 4 mg tablet Take one tablet by mouth every morning.   irbesartan (AVAPRO) 300 mg tablet Take one tablet by mouth at bedtime daily.   lancets MISC Use one each as directed daily before breakfast. Diag: E11.29   mirtazapine (REMERON) 15 mg tablet TAKE ONE TABLET BY MOUTH AT BEDTIME DAILY.   pantoprazole DR (PROTONIX) 40 mg tablet Take one tablet by mouth daily.   pioglitazone (ACTOS) 45 mg tablet Take one tablet by mouth daily.   sodium bicarbonate 650 mg tablet Take one tablet by mouth twice daily.      Education  Education provided: yes    Hypertension  Education components: BP goal and disease risk factors and encouraged patient to monitor BP at home    Time spent with patient: 20 minutes    Thank you for allowing me to participate in the care of your patient,  Lynelle Smoke, [facility]  [facility] Clinical Pharmacist

## 2023-02-01 NOTE — Progress Notes (Signed)
Patient seen at the bedside today. She is awake, alert and able to answer all questions appropriately. We re-discussed the procedure planned for today and she is still in agreement to have this done. A new consent was obtained. 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 Verbal consent obtained and witnessed by me and the bedside RN.   Soyla Dryer, [place] [phone] 02/01/2023, 1:17 PM

## 2023-02-02 ENCOUNTER — Telehealth: Payer: Self-pay

## 2023-02-02 ENCOUNTER — Inpatient Hospital Stay (HOSPITAL_COMMUNITY): Payer: PRIVATE HEALTH INSURANCE

## 2023-02-02 ENCOUNTER — Encounter: Admit: 2023-02-02 | Discharge: 2023-02-02 | Payer: MEDICARE

## 2023-02-02 DIAGNOSIS — M4620 Osteomyelitis of vertebra, site unspecified: Secondary | ICD-10-CM | POA: Diagnosis not present

## 2023-02-02 HISTORY — PX: IR THORACIC DISC ASPIRATION W/IMG GUIDE: IMG943

## 2023-02-02 HISTORY — PX: IR US GUIDE VASC ACCESS RIGHT: IMG2390

## 2023-02-02 HISTORY — PX: IR FLUORO GUIDE CV LINE RIGHT: IMG2283

## 2023-02-02 LAB — BASIC METABOLIC PANEL
Anion gap: 6 (ref 5–15)
BUN: 73 mg/dL — ABNORMAL HIGH (ref 8–23)
CO2: 18 mmol/L — ABNORMAL LOW (ref 22–32)
Calcium: 8 mg/dL — ABNORMAL LOW (ref 8.9–10.3)
Chloride: 112 mmol/L — ABNORMAL HIGH (ref 98–111)
Creatinine, Ser: 5.1 mg/dL — ABNORMAL HIGH (ref 0.44–1.00)
GFR, Estimated: 9 mL/min — ABNORMAL LOW (ref >60)
Glucose, Bld: 92 mg/dL (ref 70–99)
Potassium: 4.7 mmol/L (ref 3.5–5.1)
Sodium: 136 mmol/L (ref 135–145)

## 2023-02-02 LAB — GLUCOSE, CAPILLARY
Glucose-Capillary: 87 mg/dL (ref 70–99)
Glucose-Capillary: 89 mg/dL (ref 70–99)
Glucose-Capillary: 103 mg/dL — ABNORMAL HIGH (ref 70–99)
Glucose-Capillary: 111 mg/dL — ABNORMAL HIGH (ref 70–99)
Glucose-Capillary: 105 mg/dL — ABNORMAL HIGH (ref 70–99)
Glucose-Capillary: 101 mg/dL — ABNORMAL HIGH (ref 70–99)

## 2023-02-02 LAB — CBC WITH DIFFERENTIAL/PLATELET
Abs Immature Granulocytes: 0.02 10*3/uL (ref 0.00–0.07)
Basophils Absolute: 0 10*3/uL (ref 0.0–0.1)
Basophils Relative: 1 %
Eosinophils Absolute: 0.2 10*3/uL (ref 0.0–0.5)
Eosinophils Relative: 5 %
HCT: 25 % — ABNORMAL LOW (ref 36.0–46.0)
Hemoglobin: 7.6 g/dL — ABNORMAL LOW (ref 12.0–15.0)
Immature Granulocytes: 1 %
Lymphocytes Relative: 14 %
Lymphs Abs: 0.6 10*3/uL — ABNORMAL LOW (ref 0.7–4.0)
MCH: 27.5 pg (ref 26.0–34.0)
MCHC: 30.4 g/dL (ref 30.0–36.0)
MCV: 90.6 fL (ref 80.0–100.0)
Monocytes Absolute: 0.5 10*3/uL (ref 0.1–1.0)
Monocytes Relative: 11 %
Neutro Abs: 3 10*3/uL (ref 1.7–7.7)
Neutrophils Relative %: 68 %
Platelets: 158 10*3/uL (ref 150–400)
RBC: 2.76 MIL/uL — ABNORMAL LOW (ref 3.87–5.11)
RDW: 14.7 % (ref 11.5–15.5)
WBC: 4.4 10*3/uL (ref 4.0–10.5)
nRBC: 0 % (ref 0.0–0.2)

## 2023-02-02 LAB — CK: Total CK: 44 U/L (ref 38–234)

## 2023-02-02 LAB — AEROBIC/ANAEROBIC CULTURE W GRAM STAIN (SURGICAL/DEEP WOUND)

## 2023-02-02 MED ORDER — MIDAZOLAM HCL 2 MG/2ML IJ SOLN
INTRAMUSCULAR | Status: AC
  Filled 2023-02-02: qty 2

## 2023-02-02 MED ORDER — BUPIVACAINE HCL (PF) 0.25 % IJ SOLN
20.0000 mL | Freq: Once | INTRAMUSCULAR | Status: AC
  Administered 2023-02-02: 20 mL
  Filled 2023-02-02: qty 20

## 2023-02-02 MED ORDER — LIDOCAINE HCL (PF) 1 % IJ SOLN
INTRAMUSCULAR | Status: AC
  Filled 2023-02-02: qty 30

## 2023-02-02 MED ORDER — FENTANYL CITRATE (PF) 100 MCG/2ML IJ SOLN
INTRAMUSCULAR | Status: AC
  Filled 2023-02-02: qty 2

## 2023-02-02 MED ORDER — LIDOCAINE HCL (PF) 1 % IJ SOLN
10.0000 mL | Freq: Once | INTRAMUSCULAR | Status: AC
  Administered 2023-02-02: 10 mL

## 2023-02-02 MED ORDER — SODIUM CHLORIDE 0.9 % IV SOLN
8.0000 mg/kg | INTRAVENOUS | Status: DC
  Administered 2023-02-02 – 2023-02-04 (×2): 650 mg via INTRAVENOUS
  Filled 2023-02-02 (×2): qty 13

## 2023-02-02 MED ORDER — MIDAZOLAM HCL 2 MG/2ML IJ SOLN
INTRAMUSCULAR | Status: AC | PRN
  Administered 2023-02-02: .5 mg via INTRAVENOUS
  Administered 2023-02-02: 1 mg via INTRAVENOUS
  Administered 2023-02-02: .5 mg via INTRAVENOUS

## 2023-02-02 MED ORDER — FENTANYL CITRATE (PF) 100 MCG/2ML IJ SOLN
INTRAMUSCULAR | Status: AC | PRN
  Administered 2023-02-02 (×3): 25 ug via INTRAVENOUS

## 2023-02-02 MED ORDER — BUPIVACAINE HCL (PF) 0.5 % IJ SOLN
INTRAMUSCULAR | Status: AC
  Filled 2023-02-02: qty 30

## 2023-02-02 NOTE — Procedures (Signed)
INTERVENTIONAL NEURORADIOLOGY BRIEF POSTPROCEDURE NOTE  FLUOROSCOPY GUIDED T5 CORE BONE BIOPSY AND L4-5 DISC ASPIRATION  Attending: Dr. Baldemar Lenis  Diagnosis: T4-5 discitis/osteomyelitis  Access site: Percutaneous  Anesthesia: Moderate sedation  Medication used: 2 mg Versed IV; 75 mcg Fentanyl IV.  Complications: None  Estimated blood loss: None  Specimen: 1 core biopsy sample for tissue exam; 2 FNA samples for gram stain and culture.  Successful right transpedicular approach for L5 endplate core biopsy and L4-5 disc aspiration.  The patient tolerated the procedure well without incident or complication and is in stable condition.

## 2023-02-02 NOTE — Telephone Encounter (Signed)
Pt currently admitted.

## 2023-02-02 NOTE — Sedation Documentation (Signed)
Disc aspiration completed, transitioning to CVL

## 2023-02-02 NOTE — Progress Notes (Signed)
PROGRESS NOTE  [name] [name]  DOB: 1956/03/11  PCP: Donato Schultz, DO WUJ:[id number]  DOA: 01/28/2023  LOS: 4 days  Hospital Day: 6  Brief narrative: [name] [name] is a 67 y.o. adult with PMH significant for DM2, HTN, HLD, CAD, noncompliance to meds, chronic diastolic CHF, chronic DVT, CKD V,  liver cirrhosis, anxiety/depression, legally blind, history of L1 osteomyelitis status post L1 corpectomy, also with history of T10-T11 discitis osteomyelitis and underwent posterior arthrodesis T8-T11 and removal of T11 pedicle screws and rod removal in August 2023. 3/31, patient presented to ED for progressively worsening upper/mid back pain, chest pain, fever of 103.   In the ED, vital signs were stable Labs at baseline including negative troponin, D-dimer 2.08 (was 2.32 on labs done 6 weeks ago) Blood culture drawn. CT and MR thoracic spine were obtained.  Imaging showed progressive findings of discitis/osteomyelitis at T4-T5 level with worsened endplate destruction and mild surrounding soft tissue edematous changes, no evidence of cord compression. Neurosurgery was consulted and recommended the need for disc space biopsy.  1 dose of broad-spectrum IV antibiotics was given in the ED but not started on further course of antibiotics while waiting for biopsy. Admitted to [facility] See below for details  Subjective: Patient was seen and examined this morning.   Lying on bed.  Not in distress.  More awake today.  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   Normal WBC count Imagings as above showed progressive osteomyelitis/discitis at the level of T4-T5.  4/2, IR tried T4-T5 disc aspiration but was unsuccessful. Noted a plan to try again today. Could not get biopsy done yesterday because of somnolence.  More awake  today.   ID following.  Currently not on antibiotics.  Noted a plan to start on daptomycin following aspiration.  Will need PICC line placement as well.  Both planned for today. MRI cervical spine plan as well. For pain management, patient is currently on IV Dilaudid as needed, oral oxycodone as needed Recent Labs  Lab 01/29/23 0025 01/30/23 0858 01/31/23 0809 02/01/23 0622 02/02/23 0413  WBC 7.3 6.9 5.1 4.6 4.4    Atypical chest pain Chest pain likely due to radiculopathy related to T4-T5 discitis. Troponin negative x 2 and not consistent with ACS  Elevated D-dimer Chronic DVT D-dimer slightly elevated in the setting of chronic DVT with hx of osteomyelitis, but stable since labs done 6 weeks ago PTA supposed to be on Eliquis, unclear compliance In any case, currently not on Eliquis while waiting for biopsy.  H/o CAD/HLD Noncompliant to Eliquis and statin  Chronic diastolic CHF Liver cirrhosis Hypertension Last echo from August 2023 with EF 65 to 70% No signs of volume overload PTA on Coreg 12.5 mg daily???, Lasix 20 mg daily Currently continued on Coreg 6.25 mg twice daily.  Lasix on hold.  Continue to hold since he has poor oral intake.  Type 2 diabetes mellitus HypOglycemia A1c 4.6 on April 2023 PTA on Lantus 18 units nightly Blood glucose was low at 60s on 4/3.  Currently getting D5 NS at 50 mill per hour Currently on sliding scale insulin with Accu-Cheks Per peripheral neuropathy, PTA on Lyrica 75 mg twice daily  Recent Labs  Lab 02/01/23 1939 02/01/23 2304 02/02/23 0315 02/02/23 0802 02/02/23 1113  GLUCAP 106* 86 87 89 103*    AKI on CKD  stage V Chronic metabolic acidosis Baseline creatinine less than 4.7. Creatinine running elevated, worse at 5.48 today.  Expect improvement with hydration.  Serum bicarb level is also low, 16 today. Follows up with nephrologist Dr. Ronalee Belts as an outpatient.  I discussed with her nephrologist Dr. Ronalee Belts on 4/4.  Patient  has been noncompliant to follow-up with him as well.  He suggested to avoid PICC line if he would need AV fistula near future if she wants to proceed to dialysis. Recent Labs    12/13/22 1424 12/26/22 1340 01/12/23 1419 01/28/23 1539 01/29/23 0025 01/30/23 0858 01/30/23 1625 01/31/23 0809 02/01/23 0622 02/02/23 0413  BUN 61* 98* 66* 67* 28* 64* 66* 72* 71* 73*  CREATININE 4.50* 4.87* 4.68* 4.24* 0.88 4.81* 4.93* 5.34* 5.48* 5.10*  CO2 17* 18* 20* 18* 16* 15* 16* 14* 16* 18*    Hyperkalemia/hypomagnesemia Potassium and magnesium trend as below. Potassium level improving on Lokelma.  Repeat the same today. Recent Labs  Lab 01/29/23 0327 01/30/23 0858 01/30/23 1625 01/31/23 0809 02/01/23 0622 02/02/23 0413  K  --  6.0* 5.9* 5.3* 5.1 4.7  MG 1.6* 2.1  --   --   --   --      Anemia of chronic kidney disease Hemoglobin at baseline close to 8.  No active bleeding.  Continue to monitor Recent Labs    07/26/22 1258 08/09/22 1343 09/06/22 1325 09/06/22 1326 09/07/22 1433 10/10/22 1308 11/08/22 1243 11/15/22 1334 12/06/22 1326 12/06/22 1327 12/13/22 1424 01/12/23 1419 01/28/23 1539 01/29/23 0025 01/30/23 0858 01/31/23 0809 02/01/23 0622 02/02/23 0413  HGB 8.0*   < > 10.0*  --  10.5*   < > 9.9*   < > 8.9*  --    < > 8.3*   < > 8.2* 8.2* 7.8* 7.7* 7.6*  MCV 90.7   < > 88.7  --  86.7   < > 91.2   < > 95.5  --    < > 93.3   < > 94.9 92.8 92.4 90.3 90.6  VITAMINB12  --   --   --   --  273  --   --   --   --   --   --   --   --   --   --   --   --   --   FERRITIN 394*  --   --  401*  --   --  258  --   --  209  --  277  --   --   --   --   --   --   TIBC 232*  --  258  --   --   --  256  --  269  --   --  263  --   --   --   --   --   --   IRON 64  --  69  --   --   --  53  --  46  --   --  32  --   --   --   --   --   --   RETICCTPCT 2.2  --   --  1.4  --   --  2.1  --   --  2.1  --  2.6  --   --   --   --   --   --    < > = values in this interval not displayed.  Morbid Obesity  Body mass index is 36.56 kg/m. Patient has been advised to make an attempt to improve diet and exercise patterns to aid in weight loss.  Anxiety/Depression PTA on Cymbalta 60 mg daily Valium as needed   Mobility: Needs PT eval  Goals of care   Code Status: Full Code     DVT prophylaxis: Start SCD Place and maintain sequential compression device Start: 01/31/23 1558   Antimicrobials: Not on antibiotics currently Fluid: To continue D5 NS at 50 mill per hour Consultants: ID, IR, neurosurgery Family Communication: Husband not at bedside  Status: Inpatient Level of care:  Med-Surg   Needs to continue in-hospital care:  Pending biopsy by IR  Patient from: Home Anticipated d/c to: Pending clinical course   Diet:  Diet Order             Diet NPO time specified Except for: Sips with Meds  Diet effective midnight                   Scheduled Meds:  carvedilol  6.25 mg Oral BID WC   DULoxetine  60 mg Oral Daily   insulin aspart  0-9 Units Subcutaneous Q4H    PRN meds: acetaminophen, albuterol, HYDROmorphone (DILAUDID) injection, naLOXone (NARCAN)  injection, mouth rinse, oxyCODONE   Infusions:   DAPTOmycin (CUBICIN) 650 mg in sodium chloride 0.9 % IVPB 650 mg (02/02/23 1137)   dextrose 5 % and 0.9% NaCl 50 mL/hr at 01/31/23 1150    Antimicrobials: Anti-infectives (From admission, onward)    Start     Dose/Rate Route Frequency Ordered Stop   02/02/23 1100  DAPTOmycin (CUBICIN) 650 mg in sodium chloride 0.9 % IVPB        8 mg/kg  82.1 kg 126 mL/hr over 30 Minutes Intravenous Every 48 hours 02/02/23 0928     01/28/23 1945  vancomycin (VANCOREADY) IVPB 1500 mg/300 mL  Status:  Discontinued        1,500 mg 150 mL/hr over 120 Minutes Intravenous  Once 01/28/23 1931 01/28/23 2120   01/28/23 1945  cefTRIAXone (ROCEPHIN) 2 g in sodium chloride 0.9 % 100 mL IVPB  Status:  Discontinued        2 g 200 mL/hr over 30 Minutes Intravenous  Once  01/28/23 1931 01/28/23 2120       Nutritional status:  Body mass index is 36.56 kg/m.          Objective: Vitals:   02/02/23 0804 02/02/23 1114  BP: (!) 151/57 (!) 177/69  Pulse: 63 70  Resp: 12 16  Temp: 97.8 F (36.6 C) 97.7 F (36.5 C)  SpO2: 97% 98%    Intake/Output Summary (Last 24 hours) at 02/02/2023 1143 Last data filed at 02/01/2023 1315 Gross per 24 hour  Intake 796.67 ml  Output --  Net 796.67 ml    Filed Weights   01/28/23 1450 01/29/23 0116  Weight: 88.5 kg 82.1 kg   Weight change:  Body mass index is 36.56 kg/m.   Physical Exam: General exam: Pleasant, elderly Caucasian female.  Not in distress Skin: No rashes, lesions or ulcers. HEENT: Atraumatic, normocephalic, no obvious bleeding Lungs: Clear to auscultation bilaterally CVS: Regular rate and rhythm, no murmur GI/Abd soft, nontender, nondistended, bowels are present CNS: More awake today.  Able to answer questions appropriately Psychiatry: Sad affect Extremities: No pedal edema, no calf tenderness  Data Review: I have personally reviewed the laboratory data and studies available.  F/u labs ordered Unresulted Labs (From admission,  onward)     Start     Ordered   02/02/23 0927  CK  Add-on,   AD       Question:  Specimen collection method  Answer:  Lab=Lab collect   02/02/23 0928   01/30/23 1347  Aerobic/Anaerobic Culture w Gram Stain (surgical/deep wound)  Once,   R        01/30/23 1347   01/30/23 0500  CBC with Differential/Platelet  Daily,   R      01/29/23 1624   01/30/23 0500  Basic metabolic panel  Daily,   R      01/29/23 1624            Total time spent in review of labs and imaging, patient evaluation, formulation of plan, documentation and communication with family: 45 minutes  Signed, Lorin Glass, MD [facility] 02/02/2023

## 2023-02-02 NOTE — Progress Notes (Signed)
PHARMACY CONSULT NOTE FOR:  OUTPATIENT  PARENTERAL ANTIBIOTIC THERAPY (OPAT)  Indication: Vertebral osteomyelitis Regimen: Daptomycin 650mg  IV q48h End date: 03/30/2023  IV antibiotic discharge orders are pended. To discharging provider:  please sign these orders via discharge navigator,  Select New Orders & click on the button choice - Manage This Unsigned Work.     Thank you for allowing pharmacy to be a part of this patient's care.  [name] [name] 02/02/2023, 9:33 AM

## 2023-02-02 NOTE — Progress Notes (Signed)
[facility]  Date of Admission:  01/28/2023     Total days of antibiotics 0         ASSESSMENT:  Ms. Haseman is awaiting secondary attempt of aspiration of T4-T5 with IR. Cervical spine imaging with no significant findings. Discussed need for prolonged course of antibiotics and will need central line placement secondary to CKD Stage V. Continue to hold antibiotics pending aspiration and then start Daptomycin. Will adjust antibiotics as able pending culture results if able to be obtained. Home Health/OPAT orders. Plan for 8 weeks of treatment from start date. Will arrange follow up in ID clinic. Remaining medical and supportive care per Internal Medicine.   PLAN:  IR aspiration of T4-5. Start Daptomycin following aspiration Await/monitor cultures with change of antibiotics as appropriate.  Central line placement for OPAT OPAT/Home Health orders Remaining medical and supportive care per Internal Medicine.   Diagnosis: Vertebral osteomyelitis/discitis  Culture Result: Previously MRSE - none current  Allergies  Allergen Reactions   Bee Pollen Anaphylaxis   Broccoli [Brassica Oleracea] Anaphylaxis and Hives   Fish-Derived Products Anaphylaxis and Hives   Mushroom Extract Complex Anaphylaxis   Penicillins Anaphylaxis    **Tolerated cefepime March 2021 Did it involve swelling of the face/tongue/throat, SOB, or low BP? Yes Did it involve sudden or severe rash/hives, skin peeling, or any reaction on the inside of your mouth or nose? No Did you need to seek medical attention at a hospital or doctor's office? Yes When did it last happen?  A few months ago If all above answers are "NO", may proceed with cephalosporin use.   Rosemary Oil Anaphylaxis   Shellfish Allergy Anaphylaxis and Hives   Tomato Anaphylaxis and Hives    Raw tomatoes only, can tolerate if cooked   Acetaminophen Other (See Comments)    GI upset   Aloe Vera Hives   Doxycycline Diarrhea    Acyclovir And Related Other (See Comments)    Unknown reaction   Naproxen Hives    OPAT Orders Discharge antibiotics to be given via PICC line Discharge antibiotics: Daptomycin Per pharmacy protocol   Duration: 8 weeks End Date: 03/30/23  [facility] Care Per Protocol:  Home health RN for IV administration and teaching; PICC line care and labs.    Labs weekly while on IV antibiotics: _X_ CBC with differential _X_ BMP __ CMP _X_ CRP _X_ ESR __ Vancomycin trough _X_ CK  _X_ Please pull PIC at completion of IV antibiotics __ Please leave PIC in place until doctor has seen patient or been notified  Fax weekly labs to [phone]  Clinic Follow Up Appt:  02/22/23 at 10:30am with Dr. Thedore Mins   Principal Problem:   Vertebral osteomyelitis Active Problems:   Essential hypertension   Chest pain   Nausea and vomiting   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   Type 2 diabetes mellitus   CKD (chronic kidney disease) stage 5, GFR less than 15 ml/min    carvedilol  6.25 mg Oral BID WC   DULoxetine  60 mg Oral Daily   insulin aspart  0-9 Units Subcutaneous Q4H    SUBJECTIVE:  Afebrile overnight with no acute vents. Awaiting IR repeat attempt for aspiration. No new concerns/complaints.   Allergies  Allergen Reactions   Bee Pollen Anaphylaxis   Broccoli [Brassica Oleracea] Anaphylaxis and Hives   Fish-Derived Products Anaphylaxis and Hives   Mushroom Extract Complex Anaphylaxis   Penicillins Anaphylaxis    **Tolerated  cefepime March 2021 Did it involve swelling of the face/tongue/throat, SOB, or low BP? Yes Did it involve sudden or severe rash/hives, skin peeling, or any reaction on the inside of your mouth or nose? No Did you need to seek medical attention at a hospital or doctor's office? Yes When did it last happen?  A few months ago If all above answers are "NO", may proceed with cephalosporin use.   Rosemary Oil Anaphylaxis   Shellfish Allergy  Anaphylaxis and Hives   Tomato Anaphylaxis and Hives    Raw tomatoes only, can tolerate if cooked   Acetaminophen Other (See Comments)    GI upset   Aloe Vera Hives   Doxycycline Diarrhea   Acyclovir And Related Other (See Comments)    Unknown reaction   Naproxen Hives     Review of Systems: Review of Systems  Constitutional:  Negative for chills, fever and weight loss.  Respiratory:  Negative for cough, shortness of breath and wheezing.   Cardiovascular:  Negative for chest pain and leg swelling.  Gastrointestinal:  Negative for abdominal pain, constipation, diarrhea, nausea and vomiting.  Musculoskeletal:  Positive for back pain.  Skin:  Negative for rash.      OBJECTIVE: Vitals:   02/01/23 2300 02/02/23 0313 02/02/23 0804 02/02/23 1114  BP: (!) 154/90 124/64 (!) 151/57 (!) 177/69  Pulse: 68 62 63 70  Resp: 15 12 12 16   Temp: 97.9 F (36.6 C) 97.7 F (36.5 C) 97.8 F (36.6 C) 97.7 F (36.5 C)  TempSrc: Oral Oral Oral Oral  SpO2: 92% 99% 97% 98%  Weight:      Height:       Body mass index is 36.56 kg/m.  Physical Exam Constitutional:      General: She is not in acute distress.    Appearance: She is well-developed.  Cardiovascular:     Rate and Rhythm: Normal rate and regular rhythm.     Heart sounds: Normal heart sounds.  Pulmonary:     Effort: Pulmonary effort is normal.     Breath sounds: Normal breath sounds.  Skin:    General: Skin is warm and dry.  Neurological:     Mental Status: She is alert.  Psychiatric:        Mood and Affect: Mood normal.        Thought Content: Thought content normal.        Judgment: Judgment normal.     Lab Results Lab Results  Component Value Date   WBC 4.4 02/02/2023   HGB 7.6 (L) 02/02/2023   HCT 25.0 (L) 02/02/2023   MCV 90.6 02/02/2023   PLT 158 02/02/2023    Lab Results  Component Value Date   CREATININE 5.10 (H) 02/02/2023   BUN 73 (H) 02/02/2023   NA 136 02/02/2023   K 4.7 02/02/2023   CL 112 (H)  02/02/2023   CO2 18 (L) 02/02/2023    Lab Results  Component Value Date   ALT 6 01/29/2023   AST 10 (L) 01/29/2023   ALKPHOS 131 (H) 01/29/2023   BILITOT 0.6 01/29/2023     Microbiology: Recent Results (from the past 240 hour(s))  Blood culture (routine x 2)     Status: None (Preliminary result)   Collection Time: 01/28/23  9:00 PM   Specimen: BLOOD  Result Value Ref Range Status   Specimen Description   Final    BLOOD SITE NOT SPECIFIED Performed at [facility], [address] [place], [place]  [id number]    Special Requests   Final    BOTTLES DRAWN AEROBIC ONLY Blood Culture results may not be optimal due to an inadequate volume of blood received in culture bottles Performed at [facility], [address] [place], [place] [address]    Culture   Final    NO GROWTH 4 DAYS Performed at [facility], [address] [place], [place] [address]    Report Status PENDING  Incomplete  Blood culture (routine x 2)     Status: None (Preliminary result)   Collection Time: 01/28/23  9:10 PM   Specimen: BLOOD  Result Value Ref Range Status   Specimen Description   Final    BLOOD SITE NOT SPECIFIED Performed at [facility], [address] [place], [place] [address]    Special Requests   Final    BOTTLES DRAWN AEROBIC AND ANAEROBIC Blood Culture adequate volume Performed at [facility], [address] [place], [place] [address]    Culture   Final    NO GROWTH 4 DAYS Performed at [facility], [address] [place], [place] [address]    Report Status PENDING  Incomplete  MRSA Next Gen by PCR, Nasal     Status: None   Collection Time: 01/29/23  3:54 AM   Specimen: Nasal Mucosa; Nasal Swab  Result Value Ref Range Status   MRSA by PCR Next Gen NOT DETECTED NOT DETECTED Final    Comment: (NOTE) The GeneXpert MRSA Assay (FDA approved for NASAL specimens only), is one component of a comprehensive MRSA colonization  surveillance program. It is not intended to diagnose MRSA infection nor to guide or monitor treatment for MRSA infections. Test performance is not FDA approved in patients less than 67 years old. Performed at [facility], [address] [place], [place] [address]      Marcos EkeGreg Amel Kitch, NP [facility]  02/02/2023  12:02 PM

## 2023-02-02 NOTE — Progress Notes (Signed)
Patient Status: [facility] - In-pt  Assessment and Plan: T4-T5 disc aspiration Long-term antibiotics, in need of venous access.  Patient planning for reattempt at T4-T5 disc aspiration today with Dr. Tommie Sams.  IR also consulted for tunneled central venous catheter placement for discharge home with IV abx.  SCr >5.   Risks and benefits discussed with the patient including, but not limited to bleeding, infection, vascular injury, pneumothorax which may require chest tube placement, air embolism or even death  All of the patient's questions were answered, patient is agreeable to proceed. Verbal consent obtained as patient is legally blind.  Consent signed and in chart.   ______________________________________________________________________   History of Present Illness: [name] [name] is a 67 y.o. 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 Procedure was attempted however, unsuccessful due to poor intervertebral window.  Anticipate procedure today in IR with moderate sedation.  Patient also in need of venous access for home abx.  IR consulted for tunneled CVC as opposed to arm PICC due to kidney function per Nephrology.   Allergies and medications reviewed.   Review of Systems: A 12 point ROS discussed and pertinent positives are indicated in the HPI above.  All other systems are negative.  Review of Systems  Constitutional:  Negative for fatigue and fever.  Eyes:        Legally blind  Respiratory:  Negative for cough and shortness of breath.   Gastrointestinal:  Negative for abdominal  pain.  Musculoskeletal:  Positive for back pain.  Psychiatric/Behavioral:  Negative for behavioral problems and confusion.     Vital Signs: BP (!) 177/69 (BP Location: Right Arm)   Pulse 70   Temp 97.7 F (36.5 C) (Oral)   Resp 16   Ht 4\' 11"  (1.499 m)   Wt 181 lb (82.1 kg)   SpO2 98%   BMI 36.56 kg/m   Physical Exam Vitals and nursing note reviewed.  Constitutional:      General: She is not in acute distress.    Appearance: She is well-developed. She is not ill-appearing.  Cardiovascular:     Rate and Rhythm: Normal rate and regular rhythm.  Pulmonary:     Effort: Pulmonary effort is normal.     Breath sounds: Normal breath sounds.  Skin:    General: Skin is warm and dry.  Neurological:     General: No focal deficit present.     Mental Status: She is alert.  Psychiatric:        Mood and Affect: Mood normal.        Behavior: Behavior normal.      Imaging reviewed.   Labs:  COAGS: Recent Labs    03/25/22 1911 06/23/22 0520 06/23/22 1708 01/30/23 0858  INR 1.1 1.2  --  1.2  APTT 34  --  29  --     BMP: Recent Labs    01/30/23 1625 01/31/23 0809 02/01/23 0622 02/02/23 0413  NA 137 132* 139 136  K 5.9* 5.3* 5.1 4.7  CL 112* 110 111 112*  CO2 16* 14* 16* 18*  GLUCOSE 88 144* 75 92  BUN 66* 72* 71* 73*  CALCIUM 8.5*  8.1* 8.1* 8.0*  CREATININE 4.93* 5.34* 5.48* 5.10*  GFRNONAA 9* 8* 8* 9*       Electronically Signed: Hoyt Koch, PA 02/02/2023, 1:05 PM   I spent a total of 15 minutes in face to face in clinical consultation, greater than 50% of which was counseling/coordinating care for venous access.

## 2023-02-02 NOTE — Telephone Encounter (Signed)
Initial Comment Caller states needs her pain meds , pain all over . Translation No Nurse Assessment Nurse: Vear Clock, RN, Elease Hashimoto Date/Time Lamount Cohen Time): 02/02/2023 6:39:08 AM Please select the assessment type ---Refill Does the patient have enough medication to last until the office opens? ---No Disp. Time Lamount Cohen Time) Disposition Final User 02/02/2023 6:40:32 AM Clinical Call Yes Vear Clock RN, Elease Hashimoto Final Disposition 02/02/2023 6:40:32 AM Clinical Call Yes Vear Clock, RN, Elease Hashimoto Comments User: Aviva Kluver, RN Date/Time Lamount Cohen Time): 02/02/2023 6:40:23 AM She declined a pain assessment. She wanted to hang up Referrals REFERRED TO PCP OFFICE

## 2023-02-02 NOTE — Procedures (Signed)
Interventional Radiology Procedure Note  Procedure: Placement of a right IJ approach single lumen tunneled, cuffed CVC.  Tip is positioned at the superior cavoatrial junction and catheter is ready for immediate use.  Complications: None Recommendations:  - Ok to use - Do not submerge - Routine line care   Signed,  Yvone Neu. Loreta Ave, DO

## 2023-02-03 DIAGNOSIS — M4620 Osteomyelitis of vertebra, site unspecified: Secondary | ICD-10-CM | POA: Diagnosis not present

## 2023-02-03 LAB — GLUCOSE, CAPILLARY
Glucose-Capillary: 99 mg/dL (ref 70–99)
Glucose-Capillary: 108 mg/dL — ABNORMAL HIGH (ref 70–99)
Glucose-Capillary: 104 mg/dL — ABNORMAL HIGH (ref 70–99)
Glucose-Capillary: 97 mg/dL (ref 70–99)
Glucose-Capillary: 105 mg/dL — ABNORMAL HIGH (ref 70–99)

## 2023-02-03 LAB — CULTURE, BLOOD (ROUTINE X 2)
Culture: NO GROWTH
Culture: NO GROWTH
Special Requests: ADEQUATE

## 2023-02-03 LAB — CBC WITH DIFFERENTIAL/PLATELET
Abs Immature Granulocytes: 0.02 10*3/uL (ref 0.00–0.07)
Basophils Absolute: 0 10*3/uL (ref 0.0–0.1)
Basophils Relative: 1 %
Eosinophils Absolute: 0.2 10*3/uL (ref 0.0–0.5)
Eosinophils Relative: 4 %
HCT: 26.4 % — ABNORMAL LOW (ref 36.0–46.0)
Hemoglobin: 7.7 g/dL — ABNORMAL LOW (ref 12.0–15.0)
Immature Granulocytes: 0 %
Lymphocytes Relative: 10 %
Lymphs Abs: 0.5 10*3/uL — ABNORMAL LOW (ref 0.7–4.0)
MCH: 27.2 pg (ref 26.0–34.0)
MCHC: 29.2 g/dL — ABNORMAL LOW (ref 30.0–36.0)
MCV: 93.3 fL (ref 80.0–100.0)
Monocytes Absolute: 0.5 10*3/uL (ref 0.1–1.0)
Monocytes Relative: 10 %
Neutro Abs: 3.8 10*3/uL (ref 1.7–7.7)
Neutrophils Relative %: 75 %
Platelets: 162 10*3/uL (ref 150–400)
RBC: 2.83 MIL/uL — ABNORMAL LOW (ref 3.87–5.11)
RDW: 15 % (ref 11.5–15.5)
WBC: 5.1 10*3/uL (ref 4.0–10.5)
nRBC: 0 % (ref 0.0–0.2)

## 2023-02-03 LAB — BASIC METABOLIC PANEL
Anion gap: 10 (ref 5–15)
BUN: 63 mg/dL — ABNORMAL HIGH (ref 8–23)
CO2: 15 mmol/L — ABNORMAL LOW (ref 22–32)
Calcium: 8.1 mg/dL — ABNORMAL LOW (ref 8.9–10.3)
Chloride: 111 mmol/L (ref 98–111)
Creatinine, Ser: 4.79 mg/dL — ABNORMAL HIGH (ref 0.44–1.00)
GFR, Estimated: 9 mL/min — ABNORMAL LOW (ref >60)
Glucose, Bld: 105 mg/dL — ABNORMAL HIGH (ref 70–99)
Potassium: 4.7 mmol/L (ref 3.5–5.1)
Sodium: 136 mmol/L (ref 135–145)

## 2023-02-03 MED ORDER — APIXABAN 2.5 MG PO TABS
2.5000 mg | ORAL_TABLET | Freq: Two times a day (BID) | ORAL | 0 refills | Status: AC
Start: 2023-02-03 — End: 2023-03-05

## 2023-02-03 MED ORDER — HYDROCODONE-ACETAMINOPHEN 5-325 MG PO TABS
1.0000 | ORAL_TABLET | Freq: Four times a day (QID) | ORAL | 0 refills | Status: AC | PRN
Start: 2023-02-03 — End: 2023-02-08

## 2023-02-03 MED ORDER — CARVEDILOL 6.25 MG PO TABS: 6.2500 mg | ORAL_TABLET | Freq: Two times a day (BID) | ORAL | 0 refills | Status: AC

## 2023-02-03 MED ORDER — DAPTOMYCIN IV (FOR PTA / DISCHARGE USE ONLY)
650.0000 mg | INTRAVENOUS | 0 refills | Status: DC
Start: 2023-02-03 — End: 2023-04-05

## 2023-02-03 NOTE — Discharge Summary (Signed)
Physician Discharge Summary  [name] [name] ZOX:[id number] DOB: Oct 05, 1956 DOA: 01/28/2023  PCP: Donato Schultz, DO  Admit date: 01/28/2023 Discharge date: 02/04/2023  Admitted From: Home Discharge disposition: Home with home with PT, IV antibiotics  Recommendations at discharge:  Encouraged to comply with IV antibiotics course as recommended Follow-up with ID as an outpatient Encouraged to comply with other medications Follow-up with nephrology as an outpatient.   Brief narrative: [name] [name] is a 67 y.o. adult with PMH significant for DM2, HTN, HLD, CAD, noncompliance to meds, chronic diastolic CHF, chronic DVT, CKD V,  liver cirrhosis, anxiety/depression, legally blind, history of L1 osteomyelitis status post L1 corpectomy, also with history of T10-T11 discitis osteomyelitis and underwent posterior arthrodesis T8-T11 and removal of T11 pedicle screws and rod removal in August 2023. 3/31, patient presented to ED for progressively worsening upper/mid back pain, chest pain, fever of 103.   In the ED, vital signs were stable Labs at baseline including negative troponin, D-dimer 2.08 (was 2.32 on labs done 6 weeks ago) Blood culture drawn. CT and MR thoracic spine were obtained.  Imaging showed progressive findings of discitis/osteomyelitis at T4-T5 level with worsened endplate destruction and mild surrounding soft tissue edematous changes, no evidence of cord compression. MRI cervical spine did not show any evidence of infection. Neurosurgery was consulted and recommended the need for disc space biopsy.  1 dose of broad-spectrum IV antibiotics was given in the ED but not started on further course of antibiotics while waiting for biopsy. Admitted to [facility] See below for details  Subjective: Patient was seen and examined this morning.   Lying on bed.  Not in distress.  Husband at bedside.  Underwent aspiration of L4 vertebrae and tunneled central placement yesterday Plan  to discharge today with IV antibiotics.  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   Normal WBC count Imagings as above showed progressive osteomyelitis/discitis at the level of T4-T5.  4/5, underwent T4-T5 disc aspiration by IR.   4/5, also underwent tunneled central line placement. ID recommendation appreciated.  Plan to discharge home today with daptomycin IV to complete 8 weeks course of antibiotics.  Patient to follow-up with ID as an outpatient on 02/08/2023. Pain management with Vicodin as needed as before.  Atypical chest pain Chest pain likely due to radiculopathy related to T4-T5 discitis. Troponin negative x 2 and not consistent with ACS No longer having chest pain.  Elevated D-dimer Chronic DVT D-dimer slightly elevated in the setting of chronic DVT with hx of osteomyelitis, but stable since labs done 6 weeks ago PTA supposed to be on Eliquis, unclear compliance In any case, currently not on Eliquis while waiting for biopsy.  Recommend to resume Eliquis post discharge.  H/o CAD/HLD Noncompliant to Eliquis and statin  Chronic diastolic CHF Liver cirrhosis Hypertension Last echo from August 2023 with EF 65 to 70% No signs of volume overload PTA on Coreg 12.5 mg daily???, Lasix 20 mg daily.  Questionable compliance Currently continued on Coreg 6.25 mg twice daily.  Lasix remains on hold. Post discharge, continue Coreg.  Type 2 diabetes mellitus HypOglycemia A1c 4.6 on April 2023 Patient states previously, she was on Lantus 18 units nightly.  Questionable compliance. Blood glucose was low at 60s on 4/3.  She was kept on D5 NS because of poor oral intake. She has not required any long-acting insulin coverage.  I do not think she  would need insulin treatment postdischarge. Per peripheral neuropathy, PTA on Lyrica 75 mg twice daily  Recent Labs  Lab 02/03/23 1540  02/03/23 2010 02/04/23 0534 02/04/23 0722 02/04/23 1119  GLUCAP 97 105* 92 89 111*  AKI on CKD stage V Chronic metabolic acidosis Baseline creatinine between 4 and 5. Patient was seen by nephrologist Dr. Ronalee BeltsBhandari in the past.  Per my conversation with him, patient has been noncompliant to follow-up.  She needs a conversation regarding AV fistula and dialysis plan.  We avoided PICC line for that reason. Creatinine initially trended up to peak at 5.48, improving now. Discharged to follow-up with nephrologist as an outpatient. Recent Labs    12/26/22 1340 01/12/23 1419 01/28/23 1539 01/29/23 0025 01/30/23 0858 01/30/23 1625 01/31/23 0809 02/01/23 0622 02/02/23 0413 02/03/23 0530  BUN 98* 66* 67* 28* 64* 66* 72* 71* 73* 63*  CREATININE 4.87* 4.68* 4.24* 0.88 4.81* 4.93* 5.34* 5.48* 5.10* 4.79*  CO2 18* 20* 18* 16* 15* 16* 14* 16* 18* 15*   Hyperkalemia/hypomagnesemia Potassium and magnesium trend as below.  Shows improvement. Recent Labs  Lab 01/29/23 0327 01/30/23 0858 01/30/23 1625 01/31/23 0809 02/01/23 0622 02/02/23 0413 02/03/23 0530  K  --  6.0* 5.9* 5.3* 5.1 4.7 4.7  MG 1.6* 2.1  --   --   --   --   --   Anemia of chronic kidney disease Hemoglobin at baseline close to 8.  No active bleeding.  Continue to monitor Recent Labs    07/26/22 1258 08/09/22 1343 09/06/22 1325 09/06/22 1326 09/07/22 1433 10/10/22 1308 11/08/22 1243 11/15/22 1334 12/06/22 1326 12/06/22 1327 12/13/22 1424 01/12/23 1419 01/28/23 1539 01/31/23 0809 02/01/23 0622 02/02/23 0413 02/03/23 0530 02/04/23 0430  HGB 8.0*   < > 10.0*  --  10.5*   < > 9.9*   < > 8.9*  --    < > 8.3*   < > 7.8* 7.7* 7.6* 7.7* 7.7*  MCV 90.7   < > 88.7  --  86.7   < > 91.2   < > 95.5  --    < > 93.3   < > 92.4 90.3 90.6 93.3 93.5  VITAMINB12  --   --   --   --  273  --   --   --   --   --   --   --   --   --   --   --   --   --   FERRITIN 394*  --   --  401*  --   --  258  --   --  209  --  277  --   --    --   --   --   --   TIBC 232*  --  258  --   --   --  256  --  269  --   --  263  --   --   --   --   --   --   IRON 64  --  69  --   --   --  53  --  46  --   --  32  --   --   --   --   --   --   RETICCTPCT 2.2  --   --  1.4  --   --  2.1  --   --  2.1  --  2.6  --   --   --   --   --   --    < > =  values in this interval not displayed.   Morbid Obesity  Body mass index is 36.56 kg/m. Patient has been advised to make an attempt to improve diet and exercise patterns to aid in weight loss.  Anxiety/Depression PTA on Cymbalta 60 mg daily Valium as needed  Impaired mobility Patient has multilevel spinal stenosis of able intensity as evidenced on CT scan Uses a walker and wheelchair at home. Seen by PT.  Home with PT arranged.  Goals of care   Code Status: Full Code   Wounds:  - Wound / Incision (Open or Dehisced) 06/21/22 Incision - Open Sternum Right;Upper Open, draining green and white thick secretions (Active)  Date First Assessed/Time First Assessed: 06/21/22 1549   Wound Type: (c) Incision - Open  Location: Sternum  Location Orientation: Right;Upper  Wound Description (Comments): Open, draining green and white thick secretions  Present on Admission: Yes    Assessments 06/21/2022  3:00 PM 07/11/2022  8:00 AM  Dressing Type Other (Comment) Foam - Lift dressing to assess site every shift  Dressing Changed Reinforced --  Dressing Status New drainage Clean, Dry, Intact  Dressing Change Frequency PRN PRN  Site / Wound Assessment Other (Comment) Dressing in place / Unable to assess  Peri-wound Assessment Erythema (blanchable) Erythema (blanchable)  Drainage Amount Moderate --  Drainage Description Green;Purulent --  Treatment Cleansed --     No associated orders.     Wound / Incision (Open or Dehisced) 02/02/23 Puncture Vertebral column Medial;Upper T 4-5 disc aspiration puncture site (Active)  Date First Assessed/Time First Assessed: 02/02/23 1345   Wound Type: Puncture  Location:  Vertebral column  Location Orientation: Medial;Upper  Wound Description (Comments): T 4-5 disc aspiration puncture site  Present on Admission: No    Assessments 02/02/2023  1:45 PM 02/04/2023  8:00 AM  Dressing Type Gauze (Comment);Transparent dressing Gauze (Comment)  Dressing Changed New --  Dressing Status Clean, Dry, Intact Old drainage;Dry;Intact  Dressing Change Frequency PRN --  Site / Wound Assessment Clean;Pink;Dry Dressing in place / Unable to assess  Peri-wound Assessment Intact --  Closure None --  Drainage Amount None --  Treatment Cleansed --     No associated orders.    Discharge Exam:   Vitals:   02/04/23 0332 02/04/23 0729 02/04/23 0809 02/04/23 1121  BP: (!) 163/62 (!) 184/69 (!) 174/72 (!) 166/72  Pulse: 81 80 78 68  Resp: 18 20  18   Temp: 97.6 F (36.4 C) 97.6 F (36.4 C)  97.6 F (36.4 C)  TempSrc: Oral Oral  Oral  SpO2: 96% 100% 100% 97%  Weight:      Height:        Body mass index is 36.56 kg/m.   General exam: Pleasant, elderly Caucasian female.  Not in distress Skin: No rashes, lesions or ulcers. HEENT: Atraumatic, normocephalic, no obvious bleeding Lungs: Clear to auscultation bilaterally CVS: Regular rate and rhythm, no murmur GI/Abd soft, nontender, nondistended, bowels are present CNS: More awake today.  Able to answer questions appropriately Psychiatry: Sad affect Extremities: No pedal edema, no calf tenderness  Follow ups:    Follow-up Information     Danelle Earthly, MD Follow up.   Specialty: Infectious Diseases Why: 02/22/23 at 10:30am. Please call to reschedule if you are not able to make this appointment. Contact information: [address] [place] [address] [phone]         Maxie Barb, MD Follow up.   Specialties: Nephrology, Internal Medicine Contact information: [phone]  [address] [place] [address] [phone]                 Discharge Instructions:   Discharge Instructions      [facility] pharmacist to adjust dose for Vancomycin, Aminoglycosides and other anti-infective therapies as requested by physician.   Complete by: As directed    [facility] to provide Cath Flo 2mg    Complete by: As directed    Administer for PICC line occlusion and as ordered by physician for other access device issues.   Anaphylaxis Kit: Provided to treat any anaphylactic reaction to the medication being provided to the patient if First Dose or when requested by physician   Complete by: As directed    Epinephrine 1mg /ml vial / amp: Administer 0.3mg  (0.37ml) subcutaneously once for moderate to severe anaphylaxis, nurse to call physician and pharmacy when reaction occurs and call 911 if needed for immediate care   Diphenhydramine 50mg /ml IV vial: Administer 25-50mg  IV/IM PRN for first dose reaction, rash, itching, mild reaction, nurse to call physician and pharmacy when reaction occurs   Sodium Chloride 0.9% NS IV: Administer if needed for hypovolemic blood pressure drop or as ordered by physician after call to physician with anaphylactic reaction   Call MD for:  difficulty breathing, headache or visual disturbances   Complete by: As directed    Call MD for:  extreme fatigue   Complete by: As directed    Call MD for:  hives   Complete by: As directed    Call MD for:  persistant dizziness or light-headedness   Complete by: As directed    Call MD for:  persistant nausea and vomiting   Complete by: As directed    Call MD for:  severe uncontrolled pain   Complete by: As directed    Call MD for:  temperature >100.4   Complete by: As directed    Change dressing on IV access line weekly and PRN   Complete by: As directed    Diet Carb Modified   Complete by: As directed    Discharge instructions   Complete by: As directed    Recommendations at discharge:   Encouraged to comply with IV antibiotics course as recommended  Follow-up with ID as an  outpatient  Encouraged to comply with other medications  Follow-up with nephrology as an outpatient.  Discharge instructions for diabetes mellitus: Check blood sugar 3 times a day and bedtime at home. If blood sugar running above 200 or less than 70 please call your MD to adjust insulin. If you notice signs and symptoms of hypoglycemia (low blood sugar) like jitteriness, confusion, thirst, tremor and sweating, please check blood sugar, drink sugary drink/biscuits/sweets to increase sugar level and call MD or return to ER.    General discharge instructions: Follow with Primary MD Zola Button, Grayling Congress, DO in 7 days  Please request your PCP  to go over your hospital tests, procedures, radiology results at the follow up. Please get your medicines reviewed and adjusted.  Your PCP may decide to repeat certain labs or tests as needed. Do not drive, operate heavy machinery, perform activities at heights, swimming or participation in water activities or provide baby sitting services if your were admitted for syncope or siezures until you have seen by Primary MD or a Neurologist and advised to do so again. [place] Controlled Substance Reporting System database was reviewed. Do not drive, operate heavy machinery, perform activities at heights, swim, participate in  water activities or provide baby-sitting services while on medications for pain, sleep and mood until your outpatient physician has reevaluated you and advised to do so again.  You are strongly recommended to comply with the dose, frequency and duration of prescribed medications. Activity: As tolerated with Full fall precautions use walker/cane & assistance as needed Avoid using any recreational substances like cigarette, tobacco, alcohol, or non-prescribed drug. If you experience worsening of your admission symptoms, develop shortness of breath, life threatening emergency, suicidal or homicidal thoughts you must seek medical attention  immediately by calling 911 or calling your MD immediately  if symptoms less severe. You must read complete instructions/literature along with all the possible adverse reactions/side effects for all the medicines you take and that have been prescribed to you. Take any new medicine only after you have completely understood and accepted all the possible adverse reactions/side effects.  Wear Seat belts while driving. You were cared for by a hospitalist during your hospital stay. If you have any questions about your discharge medications or the care you received while you were in the hospital after you are discharged, you can call the unit and ask to speak with the hospitalist or the covering physician. Once you are discharged, your primary care physician will handle any further medical issues. Please note that NO REFILLS for any discharge medications will be authorized once you are discharged, as it is imperative that you return to your primary care physician (or establish a relationship with a primary care physician if you do not have one).   Discharge wound care:   Complete by: As directed    Flush IV access with Sodium Chloride 0.9% and Heparin 10 units/ml or 100 units/ml   Complete by: As directed    Home infusion instructions - [facility]   Complete by: As directed    Instructions: Flush IV access with Sodium Chloride 0.9% and Heparin 10units/ml or 100units/ml   Change dressing on IV access line: Weekly and PRN   Instructions Cath Flo 2mg : Administer for PICC Line occlusion and as ordered by physician for other access device   [facility] pharmacist to adjust dose for: Vancomycin, Aminoglycosides and other anti-infective therapies as requested by physician   Increase activity slowly   Complete by: As directed    Method of administration may be changed at the discretion of home infusion pharmacist based upon assessment of the patient and/or caregiver's ability to  self-administer the medication ordered   Complete by: As directed    Outpatient Parenteral Antibiotic Therapy Information Antibiotic: Daptomycin (Cubicin) IVPB; Indications for use: vertebral osteomyelitis; End Date: 03/30/2023   Complete by: As directed    Antibiotic: Daptomycin (Cubicin) IVPB   Indications for use: vertebral osteomyelitis   End Date: 03/30/2023       Discharge Medications:   Allergies as of 02/04/2023       Reactions   Bee Pollen Anaphylaxis   Broccoli [brassica Oleracea] Anaphylaxis, Hives   Fish-derived Products Anaphylaxis, Hives   Mushroom Extract Complex Anaphylaxis   Penicillins Anaphylaxis   **Tolerated cefepime March 2021 Did it involve swelling of the face/tongue/throat, SOB, or low BP? Yes Did it involve sudden or severe rash/hives, skin peeling, or any reaction on the inside of your mouth or nose? No Did you need to seek medical attention at a hospital or doctor's office? Yes When did it last happen?  A few months ago If all above answers are "NO", may proceed with cephalosporin use.  Rosemary Oil Anaphylaxis   Shellfish Allergy Anaphylaxis, Hives   Tomato Anaphylaxis, Hives   Raw tomatoes only, can tolerate if cooked   Acetaminophen Other (See Comments)   GI upset   Aloe Vera Hives   Doxycycline Diarrhea   Acyclovir And Related Other (See Comments)   Unknown reaction   Naproxen Hives        Medication List     STOP taking these medications    amLODipine 5 MG tablet Commonly known as: NORVASC   atorvastatin 10 MG tablet Commonly known as: LIPITOR   furosemide 20 MG tablet Commonly known as: LASIX   Lantus SoloStar 100 UNIT/ML Solostar Pen Generic drug: insulin glargine       TAKE these medications    acetaminophen 325 MG tablet Commonly known as: TYLENOL Take 2 tablets (650 mg total) by mouth every 6 (six) hours as needed for mild pain, fever or headache (or Fever >/= 101). Discontinue 500/1000 mg dosing   Advair HFA  115-21 MCG/ACT inhaler Generic drug: fluticasone-salmeterol Inhale 2 puffs into the lungs 2 (two) times daily.   albuterol 108 (90 Base) MCG/ACT inhaler Commonly known as: VENTOLIN HFA Inhale 2 puffs into the lungs every 6 (six) hours as needed for wheezing or shortness of breath. What changed: Another medication with the same name was removed. Continue taking this medication, and follow the directions you see here.   apixaban 2.5 MG Tabs tablet Commonly known as: Eliquis Take 1 tablet (2.5 mg total) by mouth 2 (two) times daily.   carvedilol 6.25 MG tablet Commonly known as: COREG Take 1 tablet (6.25 mg total) by mouth 2 (two) times daily with a meal. What changed:  medication strength how much to take when to take this   daptomycin  IVPB Commonly known as: CUBICIN Inject 650 mg into the vein every other day. Indication:  vertebral osteomyelitis First Dose: No Last Day of Therapy:  03/30/2023 Labs - Once weekly:  CBC/D, BMP, and CPK Labs - Every other week:  ESR and CRP Method of administration: IV Push Method of administration may be changed at the discretion of home infusion pharmacist based upon assessment of the patient and/or caregiver's ability to self-administer the medication ordered.   diazepam 5 MG tablet Commonly known as: VALIUM Take 1 tablet (5 mg total) by mouth every 12 (twelve) hours as needed for anxiety.   DULoxetine 60 MG capsule Commonly known as: CYMBALTA Take 1 capsule (60 mg total) by mouth daily.   ferrous sulfate 325 (65 FE) MG tablet Take 1 tablet (325 mg total) by mouth 2 (two) times daily with a meal. What changed: when to take this   folic acid 1 MG tablet Commonly known as: FOLVITE Take 2 tablets by mouth once daily   HYDROcodone-acetaminophen 5-325 MG tablet Commonly known as: NORCO/VICODIN Take 1 tablet by mouth every 6 (six) hours as needed for up to 5 days for moderate pain. What changed:  when to take this reasons to take this    lubiprostone 24 MCG capsule Commonly known as: AMITIZA Take 1 capsule (24 mcg total) by mouth 2 (two) times daily with a meal. What changed:  when to take this reasons to take this   montelukast 10 MG tablet Commonly known as: SINGULAIR Take 1 tablet (10 mg total) by mouth at bedtime.   multivitamin with minerals Tabs tablet Take 1 tablet by mouth daily.   NONFORMULARY OR COMPOUNDED ITEM Power wheelchair  #1  as directed   ondansetron  8 MG tablet Commonly known as: ZOFRAN TAKE 1/2 (ONE-HALF) TABLET BY MOUTH EVERY 8 HOURS AS NEEDED FOR NAUSEA FOR VOMITING What changed: See the new instructions.   pregabalin 75 MG capsule Commonly known as: Lyrica Take 1 capsule (75 mg total) by mouth 3 (three) times daily. What changed: when to take this   sodium bicarbonate 650 MG tablet Take 1 tablet (650 mg total) by mouth 2 (two) times daily.   tamsulosin 0.4 MG Caps capsule Commonly known as: FLOMAX Take 1 capsule (0.4 mg total) by mouth daily.   VITAMIN D PO Take 5,000 Units by mouth daily.               Discharge Care Instructions  (From admission, onward)           Start     Ordered   02/03/23 0000  Change dressing on IV access line weekly and PRN  (Home infusion instructions - [facility] )        02/03/23 1011   02/03/23 0000  Discharge wound care:        02/03/23 1011             The results of significant diagnostics from this hospitalization (including imaging, microbiology, ancillary and laboratory) are listed below for reference.    Procedures and Diagnostic Studies:   MR THORACIC SPINE WO CONTRAST  Result Date: 01/28/2023 CLINICAL DATA:  Upper back pain. Progressive discitis osteomyelitis T4-5 EXAM: MRI THORACIC SPINE WITHOUT CONTRAST TECHNIQUE: Multiplanar, multisequence MR imaging of the thoracic spine was performed. No intravenous contrast was administered. COMPARISON:  CT same day FINDINGS: Alignment:  No malalignment. Vertebrae:  Distant fusion from T8 through L3. Progressive findings of discitis osteomyelitis at the T4-5 level with worsened endplate destruction and mild surrounding soft tissue edematous change. Mild encroachment upon the spinal canal and neural foramina. No evidence of cord compression. Imaging improvement at the T10-11 level, with less T2 bright material. No significant encroachment upon the canal. Mild foraminal narrowing. Cord:  No primary cord finding. Paraspinal and other soft tissues: Otherwise negative Disc levels: Uninvolved disc levels appear unremarkable with wide patency of the canal and foramina. IMPRESSION: 1. Progressive findings of discitis osteomyelitis at the T4-5 level compared with August of 2023, with worsened endplate destruction and mild surrounding soft tissue edematous change. Mild encroachment upon the spinal canal and neural foramina but no evidence of cord compression. 2. Imaging improvement at the T10-11 level, with less T2 bright material. No significant encroachment upon the canal. Mild foraminal narrowing. 3. Distant fusion from T8 through L3. Wide patency of the canal and foramina at those levels. Electronically Signed   By: Paulina Fusi M.D.   On: 01/28/2023 20:25   CT Thoracic Spine Wo Contrast  Result Date: 01/28/2023 CLINICAL DATA:  Upper back pain. EXAM: CT THORACIC SPINE WITHOUT CONTRAST TECHNIQUE: Multidetector CT images of the thoracic were obtained using the standard protocol without intravenous contrast. RADIATION DOSE REDUCTION: This exam was performed according to the departmental dose-optimization program which includes automated exposure control, adjustment of the mA and/or kV according to patient size and/or use of iterative reconstruction technique. COMPARISON:  Chest radiographs 01/28/2023, CT cervical spine 10/16/2021, CT chest, abdomen and pelvis 03/25/2022 and MRI thoracic spine 06/22/2022. FINDINGS: Alignment: Normal. Vertebrae: Since the previous thoracic MRI and CTs  of the chest, abdomen and pelvis, the thoracolumbar fusion has been revised. Patient is now posteriorly fused from T8 through L3. The T11 pedicle screws have been removed.  There are no pedicle screws at the level of the previous L1 corpectomy. The current hardware appears intact with mild chronic loosening of the L3 pedicle screws. Findings of discitis and osteomyelitis at T10-11 do not appear progressive from the previous thoracic MRI. Findings of discitis and osteomyelitis at T4-5 have progressed from the previous MRI, with progressive endplate destruction and mild loss of vertebral body height. No new levels of discitis or osteomyelitis are suggested. There is no evidence of acute fracture or traumatic subluxation. Paraspinal and other soft tissues: There are increased paraspinal inflammatory changes at T4-5. No significant epidural component is seen. There are patchy ground-glass opacities in both lungs which may reflect edema or atelectasis. Disc levels: As above, progressive changes of diskitis and osteomyelitis with associated increased paraspinal inflammation at T4-5. No apparent significant epidural component by CT. There is some soft tissue thickening in both foramina. No large disc herniations or significant spinal stenosis demonstrated at the additional levels. Disc space evaluation at the fused levels is mildly limited by hardware artifact. IMPRESSION: 1. Progressive changes of discitis and osteomyelitis at T4-5 with progressive endplate destruction and mild loss of vertebral body height. Associated paraspinal inflammatory changes have increased. Consider follow-up thoracic MRI to better evaluate the spinal canal at this level. 2. No significant progression of discitis and osteomyelitis at T10-11. 3. No evidence of acute fracture or traumatic subluxation. 4. Interval revision of thoracolumbar fusion with posterior fusion from T8 through L3. The current hardware appears intact with mild chronic loosening  of the L3 pedicle screws. Electronically Signed   By: Carey Bullocks M.D.   On: 01/28/2023 18:15   DG Chest 2 View  Result Date: 01/28/2023 CLINICAL DATA:  chest pain EXAM: CHEST - 2 VIEW COMPARISON:  12/13/2022 FINDINGS: Relatively low lung volumes with crowding of bronchovascular structures. Linear scarring or atelectasis in the right midlung more conspicuous. Heart size upper limits normal. No effusion. Thoracolumbar fixation hardware partially visualized. Presumed posttraumatic deformity of the left humeral head. IMPRESSION: Low volumes. No acute findings. Electronically Signed   By: Corlis Leak M.D.   On: 01/28/2023 16:50     Labs:   Basic Metabolic Panel: Recent Labs  Lab 01/29/23 0327 01/30/23 0858 01/30/23 1625 01/31/23 0809 02/01/23 0622 02/02/23 0413 02/03/23 0530  NA  --  136 137 132* 139 136 136  K  --  6.0* 5.9* 5.3* 5.1 4.7 4.7  CL  --  113* 112* 110 111 112* 111  CO2  --  15* 16* 14* 16* 18* 15*  GLUCOSE  --  117* 88 144* 75 92 105*  BUN  --  64* 66* 72* 71* 73* 63*  CREATININE  --  4.81* 4.93* 5.34* 5.48* 5.10* 4.79*  CALCIUM  --  8.5* 8.5* 8.1* 8.1* 8.0* 8.1*  MG 1.6* 2.1  --   --   --   --   --    GFR Estimated Creatinine Clearance (by C-G formula based on SCr of 4.79 mg/dL (H)) Female: 40.9 mL/min (A) Female: 13 mL/min (A) Liver Function Tests: Recent Labs  Lab 01/29/23 0025  AST 10*  ALT 6  ALKPHOS 131*  BILITOT 0.6  PROT 7.4  ALBUMIN 2.9*   Recent Labs  Lab 01/29/23 0025  LIPASE 48   No results for input(s): "AMMONIA" in the last 168 hours. Coagulation profile Recent Labs  Lab 01/30/23 0858  INR 1.2    CBC: Recent Labs  Lab 01/31/23 0809 02/01/23 0622 02/02/23 0413 02/03/23 0530 02/04/23 0430  WBC 5.1 4.6 4.4 5.1 5.0  NEUTROABS 3.7 3.1 3.0 3.8 3.8  HGB 7.8* 7.7* 7.6* 7.7* 7.7*  HCT 25.7* 25.2* 25.0* 26.4* 26.0*  MCV 92.4 90.3 90.6 93.3 93.5  PLT 150 158 158 162 162   Cardiac Enzymes: Recent Labs  Lab 02/02/23 0413   CKTOTAL 44   BNP: Invalid input(s): "POCBNP" CBG: Recent Labs  Lab 02/03/23 1540 02/03/23 2010 02/04/23 0534 02/04/23 0722 02/04/23 1119  GLUCAP 97 105* 92 89 111*   D-Dimer No results for input(s): "DDIMER" in the last 72 hours. Hgb A1c No results for input(s): "HGBA1C" in the last 72 hours. Lipid Profile No results for input(s): "CHOL", "HDL", "LDLCALC", "TRIG", "CHOLHDL", "LDLDIRECT" in the last 72 hours. Thyroid function studies No results for input(s): "TSH", "T4TOTAL", "T3FREE", "THYROIDAB" in the last 72 hours.  Invalid input(s): "FREET3" Anemia work up No results for input(s): "VITAMINB12", "FOLATE", "FERRITIN", "TIBC", "IRON", "RETICCTPCT" in the last 72 hours. Microbiology Recent Results (from the past 240 hour(s))  Blood culture (routine x 2)     Status: None   Collection Time: 01/28/23  9:00 PM   Specimen: BLOOD  Result Value Ref Range Status   Specimen Description   Final    BLOOD SITE NOT SPECIFIED Performed at [facility], [address], [place], [place] [address]    Special Requests   Final    BOTTLES DRAWN AEROBIC ONLY Blood Culture results may not be optimal due to an inadequate volume of blood received in culture bottles Performed at [facility], [address] [place], [place] [address]    Culture   Final    NO GROWTH 5 DAYS Performed at [facility], [address], [place], [place] [address]    Report Status 02/03/2023 FINAL  Final  Blood culture (routine x 2)     Status: None   Collection Time: 01/28/23  9:10 PM   Specimen: BLOOD  Result Value Ref Range Status   Specimen Description   Final    BLOOD SITE NOT SPECIFIED Performed at [facility], [address], [place], [place] [address]    Special Requests   Final    BOTTLES DRAWN AEROBIC AND ANAEROBIC Blood Culture adequate volume Performed at [facility], [address] [place], [place] [address]    Culture   Final    NO  GROWTH 5 DAYS Performed at [facility], [address] [place], [place] [address]    Report Status 02/03/2023 FINAL  Final  MRSA Next Gen by PCR, Nasal     Status: None   Collection Time: 01/29/23  3:54 AM   Specimen: Nasal Mucosa; Nasal Swab  Result Value Ref Range Status   MRSA by PCR Next Gen NOT DETECTED NOT DETECTED Final    Comment: (NOTE) The GeneXpert MRSA Assay (FDA approved for NASAL specimens only), is one component of a comprehensive MRSA colonization surveillance program. It is not intended to diagnose MRSA infection nor to guide or monitor treatment for MRSA infections. Test performance is not FDA approved in patients less than 72 years old. Performed at [facility], [address], [place], [place] [address]   Aerobic/Anaerobic Culture w Gram Stain (surgical/deep wound)     Status: None (Preliminary result)   Collection Time: 02/02/23  2:24 PM   Specimen: Fluid; Tissue  Result Value Ref Range Status   Specimen Description FLUID  Final   Special Requests DISC T4  THRU 5 ASPIRATION  Final   Gram Stain   Final    FEW WBC PRESENT, PREDOMINANTLY PMN NO ORGANISMS SEEN    Culture   Final    NO GROWTH 1 DAY Performed at [facility], [address] [place], [place] [address]    Report Status PENDING  Incomplete    Time coordinating discharge: 45 minutes  Signed: Janara Klett  [facility] 02/04/2023, 11:53 AM

## 2023-02-03 NOTE — TOC Initial Note (Signed)
Transition of Care [facility]) - Initial/Assessment Note    Patient Details  Name: [name] [name] MRN: [id number] Date of Birth: Apr 05, 1956  [facility] CM/SW Contact:    Danie Chandler., RN Phone Number: 02/03/2023, 12:15 PM  Clinical Narrative:                  [name] [name] is a 67 y.o. adult with PMH significant for DM2, HTN, HLD, CAD, noncompliance to meds, chronic diastolic CHF, chronic DVT, CKD V,  liver cirrhosis, anxiety/depression, legally blind, history of L1 osteomyelitis status post L1 corpectomy, also with history of T10-T11 discitis osteomyelitis and underwent posterior arthrodesis T8-T11 and removal of T11 pedicle screws and rod removal in August 2023. 3/31, patient presented to ED for progressively worsening upper/mid back pain, chest pain, fever of 103.   RNCM received orders for HHPT, RN, IV Antibiotics.  RNCM spoke with patient's husband who states they have used [facility] and [facility] before and would like to use them again.  RNCM contacted Kandee Keen with [facility] and Pam with [facility] and confirmation of services received.    Expected Discharge Plan: Home w Home Health Services Barriers to Discharge: No Barriers Identified  Patient Goals and CMS Choice Patient states their goals for this hospitalization and ongoing recovery are:: to be able to go home CMS Medicare.gov Compare Post Acute Care list provided to:: Patient Represenative (must comment) (spouse) Choice offered to / list presented to : Spouse     Expected Discharge Plan and Services   Discharge Planning Services: CM Consult Post Acute Care Choice: Home Health Living arrangements for the past 2 months: Apartment Expected Discharge Date: 02/03/23                 DME Agency: NA       HH Arranged: RN, IV Antibiotics, PT HH Agency: [facility], [facility] Date [facility] Agency Contacted: 02/03/23 Time HH Agency Contacted: 1212 Representative spoke with at [facility] Agency: Kandee Keen at  [place] and Pam at [employer]  Prior Living Arrangements/Services Living arrangements for the past 2 months: Apartment Lives with:: Spouse   Do you feel safe going back to the place where you live?: Yes      Need for Family Participation in Patient Care: Yes (Comment) Care giver support system in place?: Yes (comment)   Criminal Activity/Legal Involvement Pertinent to Current Situation/Hospitalization: No - Comment as needed  Activities of Daily Living Home Assistive Devices/Equipment: None ADL Screening (condition at time of admission) Patient's cognitive ability adequate to safely complete daily activities?: Yes Is the patient deaf or have difficulty hearing?: No Does the patient have difficulty seeing, even when wearing glasses/contacts?: No Does the patient have difficulty concentrating, remembering, or making decisions?: No Patient able to express need for assistance with ADLs?: Yes Does the patient have difficulty dressing or bathing?: No Independently performs ADLs?: Yes (appropriate for developmental age) Does the patient have difficulty walking or climbing stairs?: Yes Weakness of Legs: Both Weakness of Arms/Hands: None  Permission Sought/Granted                 Emotional Assessment Appearance:: Appears stated age Attitude/Demeanor/Rapport: Engaged Affect (typically observed): Accepting Orientation: : Oriented to Self, Oriented to Place, Oriented to  Time, Oriented to Situation   Psych Involvement: No (comment)  Admission diagnosis:  Discitis of thoracic region [M46.44] Vertebral osteomyelitis [M46.20] Patient Active Problem List   Diagnosis Date Noted   Vertebral osteomyelitis 01/28/2023   Osteoporosis of  forearm 01/19/2023   Osteomyelitis 06/20/2022   Estrogen deficiency 05/25/2022   CKD (chronic kidney disease) stage 5, GFR less than 15 ml/min 02/14/2022   Prolonged QT interval 01/07/2022   Streptococcal bacteremia    Osteomyelitis of left foot 10/16/2021    Diabetic foot ulcers 10/16/2021   Type 2 diabetes mellitus 10/16/2021   Portal hypertension 08/16/2021   Eczema of scalp 06/27/2021   Hypothyroidism 06/27/2021   Pruritus 06/27/2021   Seasonal allergic rhinitis due to pollen 02/02/2021   PTSD (post-traumatic stress disorder)    Osteoarthritis    MRSA (methicillin resistant Staphylococcus aureus)    Hypertension    High cholesterol    Hepatic steatosis    GERD (gastroesophageal reflux disease)    Fibromyalgia    Episode of visual loss of left eye    Depression    Constipation    Cataract    Bulging lumbar disc    Asthma    Anxiety    Anemia    Tinea capitis 10/11/2020   Tinea corporis 10/11/2020   Hip pain 05/16/2020   IDA (iron deficiency anemia) 05/04/2020   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 03/16/2020   S/P lumbar fusion 03/04/2020   Spinal stenosis of lumbar region 01/22/2020   Osteomyelitis of vertebra of thoracolumbar region 01/21/2020   Compression fracture of L1 lumbar vertebra 11/17/2019   Fracture of multiple ribs 11/17/2019   AKI (acute kidney injury) 11/15/2019   Nausea & vomiting 11/15/2019   Diarrhea 09/04/2019   Pressure injury of skin 04/17/2019   Septic arthritis of elbow, right 04/17/2019   Retinopathy 04/17/2019   Renal transplant, 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   Sleep apnea 11/16/2017   Diabetic foot infection 04/06/2017   Type 2 diabetes mellitus with hyperglycemia, with long-term current use of insulin 04/06/2017   Neuropathy 05/05/2015   Diabetic peripheral neuropathy 01/12/2015   CKD (chronic kidney disease), stage III (HCC) 05/27/2014   History of MI (myocardial infarction) 10/06/2013   Chest pain 10/06/2013   Nausea and vomiting 10/06/2013   Obesity (BMI 30-39.9) 09/20/2013   Multinodular goiter 04/17/2013   Normocytic anemia 02/03/2013   Proximal humerus fracture 10/15/2012   CAD (coronary artery disease) 01/11/2012   UTI (urinary tract infection) 08/05/2010    Hepatic cirrhosis 07/06/2010   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   Hyperlipidemia 06/03/2008   Essential hypertension 12/23/2006   Acute MI 2007   PCP:  Donato SchultzLowne Chase, Yvonne R, DO Pharmacy:   [facility] [address] - [place], [place] - [id number] [place] [address] Phone: [phone] Fax: [phone]  [facility] [address] [place] [address] Phone: [phone] Fax: [phone]  Social Determinants of Health (SDOH) Social History: SDOH Screenings   Food Insecurity: No Food Insecurity (01/29/2023)  Housing: Low Risk  (01/29/2023)  Transportation Needs: No Transportation Needs (01/29/2023)  Utilities: Not At Risk (01/29/2023)  Alcohol Screen: Low Risk  (12/30/2021)  Depression (PHQ2-9): Low Risk  (08/23/2022)  Financial Resource Strain: Low Risk  (12/30/2021)  Physical Activity: Inactive (12/30/2021)  Social Connections: Socially Integrated (12/30/2021)  Stress: Stress Concern Present (12/30/2021)  Tobacco Use: Low Risk  (02/02/2023)   SDOH Interventions: Housing Interventions: Intervention Not Indicated  Readmission Risk Interventions    06/23/2022    2:13 PM  Readmission Risk Prevention Plan  Transportation Screening Complete  PCP or Specialist Appt within 3-5 Days Complete  HRI or Home Care Consult Complete  Social Work Consult for Recovery Care  Planning/Counseling Complete  Palliative Care Screening Not Applicable  Medication Review (RN Care Manager) Complete

## 2023-02-04 LAB — CBC WITH DIFFERENTIAL/PLATELET
Abs Immature Granulocytes: 0.02 10*3/uL (ref 0.00–0.07)
Basophils Absolute: 0 10*3/uL (ref 0.0–0.1)
Basophils Relative: 1 %
Eosinophils Absolute: 0.2 10*3/uL (ref 0.0–0.5)
Eosinophils Relative: 3 %
HCT: 26 % — ABNORMAL LOW (ref 36.0–46.0)
Hemoglobin: 7.7 g/dL — ABNORMAL LOW (ref 12.0–15.0)
Immature Granulocytes: 0 %
Lymphocytes Relative: 12 %
Lymphs Abs: 0.6 10*3/uL — ABNORMAL LOW (ref 0.7–4.0)
MCH: 27.7 pg (ref 26.0–34.0)
MCHC: 29.6 g/dL — ABNORMAL LOW (ref 30.0–36.0)
MCV: 93.5 fL (ref 80.0–100.0)
Monocytes Absolute: 0.5 10*3/uL (ref 0.1–1.0)
Monocytes Relative: 9 %
Neutro Abs: 3.8 10*3/uL (ref 1.7–7.7)
Neutrophils Relative %: 75 %
Platelets: 162 10*3/uL (ref 150–400)
RBC: 2.78 MIL/uL — ABNORMAL LOW (ref 3.87–5.11)
RDW: 14.9 % (ref 11.5–15.5)
WBC: 5 10*3/uL (ref 4.0–10.5)
nRBC: 0 % (ref 0.0–0.2)

## 2023-02-04 LAB — AEROBIC/ANAEROBIC CULTURE W GRAM STAIN (SURGICAL/DEEP WOUND)

## 2023-02-04 LAB — GLUCOSE, CAPILLARY
Glucose-Capillary: 92 mg/dL (ref 70–99)
Glucose-Capillary: 89 mg/dL (ref 70–99)
Glucose-Capillary: 111 mg/dL — ABNORMAL HIGH (ref 70–99)

## 2023-02-04 MED ORDER — HEPARIN SOD (PORK) LOCK FLUSH 100 UNIT/ML IV SOLN
250.0000 [IU] | INTRAVENOUS | Status: AC | PRN
  Administered 2023-02-04: 250 [IU]
  Filled 2023-02-04: qty 3

## 2023-02-04 NOTE — TOC Transition Note (Addendum)
Transition of Care [facility]) - CM/SW Discharge Note   Patient Details  Name: [name] [name] MRN: [id number] Date of Birth: [birth year]  Transition of Care [facility]) CM/SW Contact:  Leone Haven, RN Phone Number: 02/04/2023, 8:27 AM   Clinical Narrative:    Patient is for dc today, home with iv abx, NCM contacted Pam with [facility] and she has confirmed patient is good to go home today , she should receive iv abx here and then go home . NCM  also notified [facility].  NCM notified by Staff RN that patient wears oxygen at home  and asked this NCM if oxygen is in the room.  NCM called spoke with spouse in the room and he states she only wears oxygen at home as needed.  NCM informed the Staff RN Gershon Cull to do an ambulation sat check to see if she needs oxygen continuously because she is on 3 liters in the room, and to let this NCM know if she needs to have the oxygen continuously, if so we need a new oxygen order. Per Staff RN , patient is satting well on room air , no new oxygen orders needed per MD.   Final next level of care: Home w Home Health Services Barriers to Discharge: No Barriers Identified   Patient Goals and CMS Choice CMS Medicare.gov Compare Post Acute Care list provided to:: Patient Represenative (must comment) (spouse) Choice offered to / list presented to : Spouse  Discharge Placement                         Discharge Plan and Services Additional resources added to the After Visit Summary for     Discharge Planning Services: CM Consult Post Acute Care Choice: Home Health            DME Agency: NA       HH Arranged: RN, IV Antibiotics, PT HH Agency: [facility], [facility] Date [facility] Agency Contacted: 02/03/23 Time HH Agency Contacted: 1212 Representative spoke with at [facility] Agency: Kandee Keen at [place] and Elita Quick at [facility] (SDOH) Interventions SDOH Screenings   Food Insecurity: No Food Insecurity  (01/29/2023)  Housing: Low Risk  (01/29/2023)  Transportation Needs: No Transportation Needs (01/29/2023)  Utilities: Not At Risk (01/29/2023)  Alcohol Screen: Low Risk  (12/30/2021)  Depression (PHQ2-9): Low Risk  (08/23/2022)  Financial Resource Strain: Low Risk  (12/30/2021)  Physical Activity: Inactive (12/30/2021)  Social Connections: Socially Integrated (12/30/2021)  Stress: Stress Concern Present (12/30/2021)  Tobacco Use: Low Risk  (02/02/2023)     Readmission Risk Interventions    06/23/2022    2:13 PM  Readmission Risk Prevention Plan  Transportation Screening Complete  PCP or Specialist Appt within 3-5 Days Complete  HRI or Home Care Consult Complete  Social Work Consult for Recovery Care Planning/Counseling Complete  Palliative Care Screening Not Applicable  Medication Review Oceanographer) Complete

## 2023-02-04 NOTE — Progress Notes (Signed)
Patient was prepared for discharge yesterday. However home health infusion services could not be arranged on a short notice and hence patient could not be discharged. Briefly seen this morning.  Husband at bedside. No new issues overnight. Okay to discharge today [facility] will not bill for today.

## 2023-02-04 NOTE — Progress Notes (Signed)
Pt discharge education completed with pt and spouse at bedside. Both voices understanding and denies any questions. Pt PICC flushed by IV team per protocol. Pt husband handed discharge packet including pt's home IV antibiotic prescription. Pt discharged home and spouse to transport her home. Pt transported off unit via wheelchair with spouse and belongings to the side. Per pt's spouse, pt had oxygen concentrator at home and pt is not going to be doing any walking or much activity requiring pt to be oxygen. Pt weaned off oxygen and oxygenation remained greater than 93% RA. Arabella Merles Akesha Uresti RN.

## 2023-02-05 ENCOUNTER — Ambulatory Visit: Payer: Self-pay

## 2023-02-05 ENCOUNTER — Telehealth: Payer: Self-pay

## 2023-02-05 LAB — AEROBIC/ANAEROBIC CULTURE W GRAM STAIN (SURGICAL/DEEP WOUND)

## 2023-02-05 NOTE — Transitions of Care (Post Inpatient/ED Visit) (Signed)
   02/05/2023  Name: [name] [name] MRN: [id number] DOB: 1956/02/14  Today's TOC FU Call Status:    Attempted to reach the patient regarding the most recent Inpatient/ED visit.  Follow Up Plan: Additional outreach attempts will be made to reach the patient to complete the Transitions of Care (Post Inpatient/ED visit) call.   Signature Karena Addison, LPN [facility] Nurse Health Advisor Direct Dial [phone]

## 2023-02-05 NOTE — Chronic Care Management (AMB) (Signed)
   02/05/2023  [name] [name] August 20, 1956 [id number]   Reason for Encounter: Patient is not currently enrolled in the CCM program. CCM status changed to previously enrolled.   Katha Cabal RN Care Manager/Chronic Care Management [phone]

## 2023-02-06 ENCOUNTER — Telehealth: Payer: Self-pay

## 2023-02-06 LAB — AEROBIC/ANAEROBIC CULTURE W GRAM STAIN (SURGICAL/DEEP WOUND)

## 2023-02-06 LAB — SURGICAL PATHOLOGY

## 2023-02-06 NOTE — Telephone Encounter (Signed)
Received call from Herbert Seta [facility] nurse. She is asking about PT orders as patient's discharge summary states she should receive PT.   Advised she call patient's PCP for PT orders. Herbert Seta states it will still be on the same 485 form as HH nursing and would need to be signed by Dr. Thedore Mins. Will route to provider.   [name] [phone]  Sandie Ano, RN

## 2023-02-07 ENCOUNTER — Telehealth: Payer: Self-pay | Admitting: Family Medicine

## 2023-02-07 LAB — AEROBIC/ANAEROBIC CULTURE W GRAM STAIN (SURGICAL/DEEP WOUND)

## 2023-02-07 NOTE — Telephone Encounter (Signed)
That's fine. She needs appointment please

## 2023-02-07 NOTE — Transitions of Care (Post Inpatient/ED Visit) (Signed)
   02/07/2023  Name: [name] [name] MRN: [id number] DOB: 03-08-1956  Today's TOC FU Call Status: Today's TOC FU Call Status:: Unsuccessful Call (2nd Attempt) Unsuccessful Call (2nd Attempt) Date: 02/07/23  Attempted to reach the patient regarding the most recent Inpatient/ED visit.  Follow Up Plan: Additional outreach attempts will be made to reach the patient to complete the Transitions of Care (Post Inpatient/ED visit) call.   Signature Karena Addison, LPN [facility] Nurse Health Advisor Direct Dial [phone]

## 2023-02-07 NOTE — Telephone Encounter (Signed)
[name] [facility]) called to see about getting a PT evaluation as she was discharged from the hospital and infectious disease will not sign off on it.

## 2023-02-08 ENCOUNTER — Inpatient Hospital Stay: Payer: PRIVATE HEALTH INSURANCE | Admitting: Internal Medicine

## 2023-02-08 NOTE — Transitions of Care (Post Inpatient/ED Visit) (Signed)
   02/08/2023  Name: [name] [name] MRN: [id number] DOB: 1956-09-06  Today's TOC FU Call Status: Today's TOC FU Call Status:: Unsuccessful Call (3rd Attempt) Unsuccessful Call (2nd Attempt) Date: 02/07/23 Unsuccessful Call (3rd Attempt) Date: 02/08/23  Attempted to reach the patient regarding the most recent Inpatient/ED visit.  Follow Up Plan: No further outreach attempts will be made at this time. We have been unable to contact the patient.  Signature Karena Addison, LPN [facility] Nurse Health Advisor Direct Dial [phone]

## 2023-02-08 NOTE — Telephone Encounter (Signed)
[name] called to follow up on order for PT eval. Advised that provider needs to see her first. [name] [name] will notify the patient that she needs to call us to make appt.

## 2023-02-13 ENCOUNTER — Inpatient Hospital Stay: Payer: PRIVATE HEALTH INSURANCE | Admitting: Family Medicine

## 2023-02-15 ENCOUNTER — Telehealth: Payer: Self-pay

## 2023-02-15 NOTE — Telephone Encounter (Signed)
Patient called complain of severe back pain, back swelling and fevers x 2 days. Patient was advised that she will need to go to the ED to be evaluated. Patient stated her husband will not take her and will make her wait until she sees our office on Monday.   I encourage the patient to call EMS to transport her and she state : I can't, I will get in trouble" and she stated her husband will be upset.  I asked the patient if she felt unsafe and she stated no and that she would be fine.  [name] [name]'

## 2023-02-16 ENCOUNTER — Ambulatory Visit: Payer: PRIVATE HEALTH INSURANCE

## 2023-02-16 ENCOUNTER — Ambulatory Visit (INDEPENDENT_AMBULATORY_CARE_PROVIDER_SITE_OTHER): Payer: PRIVATE HEALTH INSURANCE | Admitting: Family Medicine

## 2023-02-16 ENCOUNTER — Encounter: Payer: Self-pay | Admitting: Family Medicine

## 2023-02-16 VITALS — BP 140/80 | HR 61 | Temp 97.5°F | Resp 18 | Ht 59.0 in

## 2023-02-16 DIAGNOSIS — G894 Chronic pain syndrome: Secondary | ICD-10-CM | POA: Diagnosis not present

## 2023-02-16 DIAGNOSIS — M8660 Other chronic osteomyelitis, unspecified site: Secondary | ICD-10-CM

## 2023-02-16 DIAGNOSIS — R079 Chest pain, unspecified: Secondary | ICD-10-CM

## 2023-02-16 DIAGNOSIS — Z981 Arthrodesis status: Secondary | ICD-10-CM

## 2023-02-16 DIAGNOSIS — M810 Age-related osteoporosis without current pathological fracture: Secondary | ICD-10-CM

## 2023-02-16 LAB — CBC WITH DIFFERENTIAL/PLATELET
Eosinophils Absolute: 193 cells/uL (ref 15–500)
MCH: 27.1 pg (ref 27.0–33.0)
MCV: 87.6 fL (ref 80.0–100.0)
Monocytes Relative: 7.6 %
Total Lymphocyte: 17 %
WBC: 6.9 10*3/uL (ref 3.8–10.8)

## 2023-02-16 MED ORDER — ROMOSOZUMAB-AQQG 105 MG/1.17ML ~~LOC~~ SOSY
210.0000 mg | PREFILLED_SYRINGE | Freq: Once | SUBCUTANEOUS | Status: AC
Start: 2023-02-16 — End: 2023-02-16
  Administered 2023-02-16: 210 mg via SUBCUTANEOUS

## 2023-02-16 MED ORDER — HYDROCODONE-ACETAMINOPHEN 5-325 MG PO TABS
1.0000 | ORAL_TABLET | Freq: Four times a day (QID) | ORAL | 0 refills | Status: AC | PRN
Start: 2023-02-16 — End: 2023-03-18

## 2023-02-16 NOTE — Telephone Encounter (Signed)
Spoke with specialty pharmacy and they need pts consent for medication to be sent to [name].  She tried calling while I was on phone but had to leave a message.  As soon as they get consent they will send out medication.  I will follow up on this on Monday.
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
## 2023-02-16 NOTE — Patient Instructions (Addendum)
Romosozumab Injection What is this medication? ROMOSOZUMAB (roe moe SOZ ue mab) prevents and treats osteoporosis. It works by Paramedic stronger and less likely to break (fracture). It is a monoclonal antibody. This medicine may be used for other purposes; ask your health care provider or pharmacist if you have questions. COMMON BRAND NAME(S): EVENITY What should I tell my care team before I take this medication? They need to know if you have any of these conditions: Dental disease Heart attack Heart disease Kidney problems Low levels of calcium in the blood On dialysis Stroke Wear dentures An unusual or allergic reaction to romosozumab, other medications, foods, dyes or preservatives Pregnant or trying to get pregnant Breast-feeding How should I use this medication? This medication is injected under the skin. It is given by your care team in a hospital or clinic setting. A special MedGuide will be given to you by the pharmacist with each prescription and refill. Be sure to read this information carefully each time. Talk to your care team about the use of this medication in children. Special care may be needed. Overdosage: If you think you have taken too much of this medicine contact a poison control center or emergency room at once. NOTE: This medicine is only for you. Do not share this medicine with others. What if I miss a dose? Keep appointments for follow-up doses. It is important not to miss your dose. Call your care team if you are unable to keep an appointment. What may interact with this medication? Interactions are not expected. This list may not describe all possible interactions. Give your health care provider a list of all the medicines, herbs, non-prescription drugs, or dietary supplements you use. Also tell them if you smoke, drink alcohol, or use illegal drugs. Some items may interact with your medicine. What should I watch for while using this medication? Your  condition will be monitored carefully while you are receiving this medication. You may need bloodwork while taking this medication. You should make sure you get enough calcium and vitamin D while you are taking this medication. Discuss the foods you eat and the vitamins you take with your care team. Some people who take this medication have severe bone, joint, or muscle pain. This medication may also increase your risk for jaw problems or a broken thigh bone. Tell your care team right away if you have severe pain in your jaw, bones, joints, or muscles. Tell you care team if you have any pain that does not go away or that gets worse. Tell your dentist and dental surgeon that you are taking this medication. You should not have major dental surgery while on this medication. See your dentist to have a dental exam and fix any dental problems before starting this medication. Take good care of your teeth while on this medication. Make sure you see your dentist for regular follow-up appointments. What side effects may I notice from receiving this medication? Side effects that you should report to your care team as soon as possible: Allergic reactions or angioedema--skin rash, itching or hives, swelling of the face, eyes, lips, tongue, arms, or legs, trouble swallowing or breathing Heart attack--pain or tightness in the chest, shoulders, arms, or jaw, nausea, shortness of breath, cold or clammy skin, feeling faint or lightheaded Low calcium level--muscle pain or cramps, confusion, tingling, or numbness in the hands or feet Osteonecrosis of the jaw--pain, swelling, or redness in the mouth, numbness of the jaw, poor healing after dental work,  unusual discharge from the mouth, visible bones in the mouth Severe bone, joint, or muscle pain Stroke--sudden numbness or weakness of the face, arm, or leg, trouble speaking, confusion, trouble walking, loss of balance or coordination, dizziness, severe headache, change in  vision Side effects that usually do not require medical attention (report to your care team if they continue or are bothersome): Headache Joint pain Muscle spasms Pain, redness, or irritation at injection site Swelling of the ankles, hands, or feet This list may not describe all possible side effects. Call your doctor for medical advice about side effects. You may report side effects to FDA at 1-800-FDA-1088. Where should I keep my medication? This medication is given in a hospital or clinic. It will not be stored at home. NOTE: This sheet is a summary. It may not cover all possible information. If you have questions about this medicine, talk to your doctor, pharmacist, or health care provider.  2023 Elsevier/Gold Standard (2021-11-16 00:00:00) Osteoporosis  Osteoporosis happens when the bones become thin and less dense than normal. Osteoporosis makes bones more brittle and fragile and more likely to break (fracture). Over time, osteoporosis can cause your bones to become so weak that they fracture after a minor fall. Bones in the hip, wrist, and spine are most likely to fracture due to osteoporosis. What are the causes? The exact cause of this condition is not known. What increases the risk? You are more likely to develop this condition if you: Have family members with this condition. Have poor nutrition. Use the following: Steroid medicines, such as prednisone. Anti-seizure medicines. Nicotine or tobacco, such as cigarettes, e-cigarettes, and chewing tobacco. Are female. Are age 28 or older. Are not physically active (are sedentary). Are of European or Asian descent. Have a small body frame. What are the signs or symptoms? A fracture might be the first sign of osteoporosis, especially if the fracture results from a fall or injury that usually would not cause a bone to break. Other signs and symptoms include: Pain in the neck or low back. Stooped posture. Loss of height. How is  this diagnosed? This condition may be diagnosed based on: Your medical history. A physical exam. A bone mineral density test, also called a DXA or DEXA test (dual-energy X-ray absorptiometry test). This test uses X-rays to measure the amount of minerals in your bones. How is this treated? This condition may be treated by: Making lifestyle changes, such as: Including foods with more calcium and vitamin D in your diet. Doing weight-bearing and muscle-strengthening exercises. Stopping tobacco use. Limiting alcohol intake. Taking medicine to slow the process of bone loss or to increase bone density. Taking daily supplements of calcium and vitamin D. Taking hormone replacement medicines, such as estrogen for women and testosterone for men. Monitoring your levels of calcium and vitamin D. The goal of treatment is to strengthen your bones and lower your risk for a fracture. Follow these instructions at home: Eating and drinking Include calcium and vitamin D in your diet. Calcium is important for bone health, and vitamin D helps your body absorb calcium. Good sources of calcium and vitamin D include: Certain fatty fish, such as salmon and tuna. Products that have calcium and vitamin D added to them (are fortified), such as fortified cereals. Egg yolks. Cheese. Liver.  Activity Do exercises as told by your health care provider. Ask your health care provider what exercises and activities are safe for you. You should do: Exercises that make you work against gravity (  weight-bearing exercises), such as tai chi, yoga, or walking. Exercises to strengthen muscles, such as lifting weights. Lifestyle Do not drink alcohol if: Your health care provider tells you not to drink. You are pregnant, may be pregnant, or are planning to become pregnant. If you drink alcohol: Limit how much you use to: 0-1 drink a day for women. 0-2 drinks a day for men. Know how much alcohol is in your drink. In the [place]  one drink equals one 12 oz bottle of beer (355 mL), one 5 oz glass of wine (148 mL), or one 1 oz glass of hard liquor (44 mL). Do not use any products that contain nicotine or tobacco, such as cigarettes, e-cigarettes, and chewing tobacco. If you need help quitting, ask your health care provider. Preventing falls Use devices to help you move around (mobility aids) as needed, such as canes, walkers, scooters, or crutches. Keep rooms well-lit and clutter-free. Remove tripping hazards from walkways, including cords and throw rugs. Install grab bars in bathrooms and safety rails on stairs. Use rubber mats in the bathroom and other areas that are often wet or slippery. Wear closed-toe shoes that fit well and support your feet. Wear shoes that have rubber soles or low heels. Review your medicines with your health care provider. Some medicines can cause dizziness or changes in blood pressure, which can increase your risk of falling. General instructions Take over-the-counter and prescription medicines only as told by your health care provider. Keep all follow-up visits. This is important. Contact a health care provider if: You have never been screened for osteoporosis and you are: A woman who is age 6 or older. A man who is age 47 or older. Get help right away if: You fall or injure yourself. Summary Osteoporosis is thinning and loss of density in your bones. This makes bones more brittle and fragile and more likely to break (fracture),even with minor falls. The goal of treatment is to strengthen your bones and lower your risk for a fracture. Include calcium and vitamin D in your diet. Calcium is important for bone health, and vitamin D helps your body absorb calcium. Talk with your health care provider about screening for osteoporosis if you are a woman who is age 50 or older, or a man who is age 35 or older. This information is not intended to replace advice given to you by your health care  provider. Make sure you discuss any questions you have with your health care provider. Document Revised: 04/01/2020 Document Reviewed: 04/01/2020 Elsevier Patient Education  2023 [employer].

## 2023-02-16 NOTE — Addendum Note (Signed)
Addended by: Thelma Barge D on: 02/16/2023 03:47 PM   Modules accepted: Orders

## 2023-02-16 NOTE — Addendum Note (Signed)
Addended by: Mervin Kung A on: 02/16/2023 03:34 PM   Modules accepted: Orders

## 2023-02-17 LAB — COMPREHENSIVE METABOLIC PANEL
AG Ratio: 0.9 (calc) — ABNORMAL LOW (ref 1.0–2.5)
ALT: 5 U/L — ABNORMAL LOW (ref 6–29)
AST: 12 U/L (ref 10–35)
Albumin: 3.5 g/dL — ABNORMAL LOW (ref 3.6–5.1)
Alkaline phosphatase (APISO): 134 U/L (ref 37–153)
BUN/Creatinine Ratio: 11 (calc) (ref 6–22)
BUN: 60 mg/dL — ABNORMAL HIGH (ref 7–25)
CO2: 15 mmol/L — ABNORMAL LOW (ref 20–32)
Calcium: 7.6 mg/dL — ABNORMAL LOW (ref 8.6–10.4)
Chloride: 108 mmol/L (ref 98–110)
Creat: 5.69 mg/dL — ABNORMAL HIGH (ref 0.50–1.05)
Globulin: 4 g/dL (calc) — ABNORMAL HIGH (ref 1.9–3.7)
Glucose, Bld: 70 mg/dL (ref 65–99)
Potassium: 4.6 mmol/L (ref 3.5–5.3)
Sodium: 137 mmol/L (ref 135–146)
Total Bilirubin: 0.4 mg/dL (ref 0.2–1.2)
Total Protein: 7.5 g/dL (ref 6.1–8.1)

## 2023-02-17 LAB — CBC WITH DIFFERENTIAL/PLATELET
Absolute Monocytes: 524 cells/uL (ref 200–950)
Basophils Absolute: 48 cells/uL (ref 0–200)
Basophils Relative: 0.7 %
Eosinophils Relative: 2.8 %
HCT: 27.5 % — ABNORMAL LOW (ref 35.0–45.0)
Hemoglobin: 8.5 g/dL — ABNORMAL LOW (ref 11.7–15.5)
Lymphs Abs: 1173 cells/uL (ref 850–3900)
MCHC: 30.9 g/dL — ABNORMAL LOW (ref 32.0–36.0)
MPV: 11.2 fL (ref 7.5–12.5)
Neutro Abs: 4961 cells/uL (ref 1500–7800)
Neutrophils Relative %: 71.9 %
Platelets: 156 10*3/uL (ref 140–400)
RBC: 3.14 10*6/uL — ABNORMAL LOW (ref 3.80–5.10)
RDW: 14.5 % (ref 11.0–15.0)

## 2023-02-19 ENCOUNTER — Ambulatory Visit (INDEPENDENT_AMBULATORY_CARE_PROVIDER_SITE_OTHER): Payer: PRIVATE HEALTH INSURANCE | Admitting: Internal Medicine

## 2023-02-19 ENCOUNTER — Other Ambulatory Visit: Payer: Self-pay

## 2023-02-19 VITALS — HR 67 | Temp 97.5°F

## 2023-02-19 DIAGNOSIS — M4625 Osteomyelitis of vertebra, thoracolumbar region: Secondary | ICD-10-CM

## 2023-02-19 NOTE — Progress Notes (Unsigned)
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   Osteomyelitis 06/20/2022   Estrogen deficiency 05/25/2022   CKD (chronic kidney disease) stage 5, GFR less than 15 ml/min 02/14/2022   Prolonged QT interval 01/07/2022   Streptococcal bacteremia    Osteomyelitis of left foot 10/16/2021   Diabetic foot ulcers 10/16/2021   Type 2 diabetes mellitus 10/16/2021   Portal hypertension 08/16/2021   Eczema of scalp 06/27/2021   Hypothyroidism 06/27/2021   Pruritus 06/27/2021   Seasonal allergic rhinitis due to pollen 02/02/2021   PTSD (post-traumatic stress disorder)    Osteoarthritis    MRSA (methicillin resistant Staphylococcus aureus)    Hypertension    High cholesterol    Hepatic steatosis    GERD (gastroesophageal reflux disease)    Fibromyalgia    Episode of visual loss of left eye    Depression    Constipation    Cataract    Bulging lumbar disc    Asthma    Anxiety    Anemia    Tinea capitis 10/11/2020   Tinea corporis 10/11/2020   Hip pain 05/16/2020   IDA (iron deficiency anemia) 05/04/2020   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 03/16/2020   S/P lumbar fusion 03/04/2020   Spinal stenosis of lumbar region 01/22/2020   Osteomyelitis of vertebra of thoracolumbar region 01/21/2020   Compression fracture of L1 lumbar vertebra 11/17/2019   Fracture of multiple ribs 11/17/2019   AKI (acute kidney injury) 11/15/2019   Nausea & vomiting 11/15/2019   Diarrhea 09/04/2019   Pressure injury of skin 04/17/2019   Septic arthritis of elbow, right 04/17/2019   Retinopathy 04/17/2019   Renal transplant, 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   Sleep apnea 11/16/2017   Diabetic foot infection 04/06/2017   Type 2 diabetes mellitus with hyperglycemia, with long-term current use of insulin 04/06/2017   Neuropathy 05/05/2015   Diabetic peripheral neuropathy 01/12/2015   CKD (chronic  kidney disease), stage III (HCC) 05/27/2014   History of MI (myocardial infarction) 10/06/2013   Chest pain 10/06/2013   Nausea and vomiting 10/06/2013   Obesity (BMI 30-39.9) 09/20/2013   Multinodular goiter 04/17/2013   Normocytic anemia 02/03/2013   Proximal humerus fracture 10/15/2012   CAD (coronary artery disease) 01/11/2012   UTI (urinary tract infection) 08/05/2010   Hepatic cirrhosis 07/06/2010   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   Hyperlipidemia 06/03/2008   Essential hypertension 12/23/2006   Acute MI 2007    Patient's Medications  New Prescriptions   No medications on file  Previous Medications   ACETAMINOPHEN (TYLENOL) 325 MG TABLET    Take 2 tablets (650 mg total) by mouth every 6 (six) hours as needed for mild pain, fever or headache (or Fever >/= 101). Discontinue 500/1000 mg dosing   ALBUTEROL (VENTOLIN HFA) 108 (90 BASE) MCG/ACT INHALER    Inhale 2 puffs into the lungs every 6 (six) hours as needed for wheezing or shortness of breath.   APIXABAN (ELIQUIS) 2.5 MG TABS TABLET    Take 1 tablet (2.5 mg total) by mouth 2 (two) times daily.   CARVEDILOL (COREG) 6.25 MG TABLET    Take 1 tablet (6.25 mg total) by mouth 2 (two) times daily with a meal.   DAPTOMYCIN (CUBICIN) IVPB    Inject 650 mg into the vein every other day. Indication:  vertebral osteomyelitis First Dose: No Last Day of Therapy:  03/30/2023 Labs -  Once weekly:  CBC/D, BMP, and CPK Labs - Every other week:  ESR and CRP Method of administration: IV Push Method of administration may be changed at the discretion of home infusion pharmacist based upon assessment of the patient and/or caregiver's ability to self-administer the medication ordered.   DIAZEPAM (VALIUM) 5 MG TABLET    Take 1 tablet (5 mg total) by mouth every 12 (twelve) hours as needed for anxiety.   DULOXETINE (CYMBALTA) 60 MG CAPSULE    Take 1 capsule (60 mg total) by mouth daily.   FERROUS SULFATE 325 (65 FE) MG TABLET    Take 1 tablet (325 mg total)  by mouth 2 (two) times daily with a meal.   FLUTICASONE-SALMETEROL (ADVAIR HFA) 115-21 MCG/ACT INHALER    Inhale 2 puffs into the lungs 2 (two) times daily.   FOLIC ACID (FOLVITE) 1 MG TABLET    Take 2 tablets by mouth once daily   HYDROCODONE-ACETAMINOPHEN (NORCO) 5-325 MG TABLET    Take 1 tablet by mouth every 6 (six) hours as needed for moderate pain.   LUBIPROSTONE (AMITIZA) 24 MCG CAPSULE    Take 1 capsule (24 mcg total) by mouth 2 (two) times daily with a meal.   MONTELUKAST (SINGULAIR) 10 MG TABLET    Take 1 tablet (10 mg total) by mouth at bedtime.   MULTIPLE VITAMIN (MULTIVITAMIN WITH MINERALS) TABS TABLET    Take 1 tablet by mouth daily.   NONFORMULARY OR COMPOUNDED ITEM    Power wheelchair  #1  as directed   ONDANSETRON (ZOFRAN) 8 MG TABLET    TAKE 1/2 (ONE-HALF) TABLET BY MOUTH EVERY 8 HOURS AS NEEDED FOR NAUSEA FOR VOMITING   PREGABALIN (LYRICA) 75 MG CAPSULE    Take 1 capsule (75 mg total) by mouth 3 (three) times daily.   SODIUM BICARBONATE 650 MG TABLET    Take 1 tablet (650 mg total) by mouth 2 (two) times daily.   TAMSULOSIN (FLOMAX) 0.4 MG CAPS CAPSULE    Take 1 capsule (0.4 mg total) by mouth daily.   VITAMIN D PO    Take 5,000 Units by mouth daily.  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    On arrival, MRI showed  progressive findings of discitis/osteomyelitis at T4/5 compared to August 2023 with worsened endplate destruction. Neurosurgery was consulted recommended engaging infectious disease and IV antibiotics followed by suppressive lifelong antibiotics.  No plans for OR, recommended percutaneous drainage to tailor antibiotic therapy as well.  Patient underwent 2/4 aspiration with IR, cultures growing MSSA, path showing chronic osteomyelitis.  Discharged on daptomycin x 8 weeks from aspiration EOT 03/30/2023 Today 02/19/23: Pt states breat pain in chest , burning in quality since surgery. Pt has not seen [facility] yet, waiting for pappt with Dr . Maisie Fus   Review of Systems: Review of Systems  All other systems reviewed and are negative.   Past Medical History:  Diagnosis Date   Acute  MI 2007   presented to ED & had cardiac cath- but found to have normal coronaries. Since that point in time her PCP cares f or cardiac needs. Dr. 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    Exertional shortness of breath    Fatty liver    Fibromyalgia    GERD (gastroesophageal reflux disease)    Hepatic steatosis    High cholesterol    History of MI (myocardial infarction) 10/06/2013   Hyperlipidemia 06/03/2008   Qualifier: Diagnosis of  By: Drue Novel MD, Nolon Rod.    Hypertension     MRSA (methicillin resistant Staphylococcus aureus)    Neuropathy    lower legs   Osteoarthritis    hands, hips   Proximal humerus fracture 10/15/2012   Left   PTSD (post-traumatic stress disorder)    Retinopathy 04/17/2019   Sleep apnea 11/16/2017   SOB (shortness of breath) 10/11/2020   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   Qualifier: Diagnosis of  By: Duaine Dredge CMA, Fele[place]     Type 2 diabetes mellitus with hyperglycemia, with long-term current use of insulin 04/06/2017    Social History   Tobacco Use   Smoking status: Never    Passive exposure: Never   Smokeless tobacco: Never  Vaping Use   Vaping Use: Never used  Substance Use Topics   Alcohol use: No    Alcohol/week: 0.0 standard drinks of alcohol   Drug use: No    Family History  Problem Relation Age of Onset   Heart disease Father    Diabetes Father    Colitis Father    Crohn's disease Father    Cancer Father        leukemia   Leukemia Father    Diabetes Mellitus II Brother    Kidney disease Brother    Heart disease Brother    Diabetes Mother    Hypertension Mother    Mental illness Mother    Irritable bowel syndrome Daughter    Diabetes Mellitus II Brother    Kidney disease Brother    Liver disease Brother    Kidney disease Brother    Heart attack Brother    Diabetes Mellitus II Brother    Heart disease Brother    Liver disease Brother    Kidney disease Brother    Kidney disease Brother    Diabetes Mellitus II Brother    Diabetes Mellitus I Brother     Allergies  Allergen Reactions   Bee Pollen Anaphylaxis   Broccoli [Brassica Oleracea] Anaphylaxis and Hives   Fish-Derived Products Anaphylaxis and Hives   Mushroom Extract Complex Anaphylaxis   Penicillins Anaphylaxis    **Tolerated cefepime March 2021 Did it involve swelling  of the face/tongue/throat, SOB, or low BP? Yes Did it involve sudden or severe rash/hives, skin peeling, or any reaction on the inside of your mouth or nose? No Did you need to  seek medical attention at a hospital or doctor's office? Yes When did it last happen?  A few months ago If all above answers are "NO", may proceed with cephalosporin use.   Rosemary Oil Anaphylaxis   Shellfish Allergy Anaphylaxis and Hives   Tomato Anaphylaxis and Hives    Raw tomatoes only, can tolerate if cooked   Acetaminophen Other (See Comments)    GI upset   Aloe Vera Hives   Doxycycline Diarrhea   Acyclovir And Related Other (See Comments)    Unknown reaction   Naproxen Hives    Health Maintenance  Topic Date Due   Zoster Vaccines- Shingrix (1 of 2) Never done   COLONOSCOPY (Pts 45-32yrs Insurance coverage will need to be confirmed)  Never done   OPHTHALMOLOGY EXAM  08/03/2016   FOOT EXAM  08/11/2016   COVID-19 Vaccine (2 - Janssen risk series) 05/08/2020   MAMMOGRAM  12/10/2020   HEMOGLOBIN A1C  08/16/2022   INFLUENZA VACCINE  05/31/2023   DTaP/Tdap/Td (4 - Td or Tdap) 12/31/2030   Pneumonia Vaccine 56+ Years old  Completed   DEXA SCAN  Completed   Hepatitis C Screening  Completed   HPV VACCINES  Aged Out    Objective:  Vitals:   02/19/23 1604  Pulse: 67  Temp: (!) 97.5 F (36.4 C)  TempSrc: Oral  SpO2: 96%   There is no height or weight on file to calculate BMI.  Physical Exam Constitutional:      General: She is not in acute distress.    Appearance: She is normal weight. She is not toxic-appearing.  HENT:     Head: Normocephalic and atraumatic.     Right Ear: External ear normal.     Left Ear: External ear normal.     Nose: No congestion or rhinorrhea.     Mouth/Throat:     Mouth: Mucous membranes are moist.     Pharynx: Oropharynx is clear.  Eyes:     Extraocular Movements: Extraocular movements intact.     Conjunctiva/sclera: Conjunctivae normal.     Pupils: Pupils are equal, round, and reactive to light.  Cardiovascular:     Rate and Rhythm: Normal rate and regular rhythm.     Heart sounds: No murmur heard.    No friction rub. No gallop.   Pulmonary:     Effort: Pulmonary effort is normal.     Breath sounds: Normal breath sounds.  Abdominal:     General: Abdomen is flat. Bowel sounds are normal.     Palpations: Abdomen is soft.  Musculoskeletal:        General: No swelling. Normal range of motion.     Cervical back: Normal range of motion and neck supple.  Skin:    General: Skin is warm and dry.  Neurological:     General: No focal deficit present.     Mental Status: She is oriented to person, place, and time.  Psychiatric:        Mood and Affect: Mood normal.     Lab Results Lab Results  Component Value Date   WBC 6.9 02/16/2023   HGB 8.5 (L) 02/16/2023   HCT 27.5 (L) 02/16/2023   MCV 87.6 02/16/2023   PLT 156 02/16/2023    Lab Results  Component Value Date  CREATININE 5.69 (H) 02/16/2023   BUN 60 (H) 02/16/2023   NA 137 02/16/2023   K 4.6 02/16/2023   CL 108 02/16/2023   CO2 15 (L) 02/16/2023    Lab Results  Component Value Date   ALT 5 (L) 02/16/2023   AST 12 02/16/2023   ALKPHOS 131 (H) 01/29/2023   BILITOT 0.4 02/16/2023    Lab Results  Component Value Date   CHOL 136 05/04/2022   HDL 28.60 (L) 05/04/2022   LDLCALC 78 05/04/2022   LDLDIRECT 115.8 01/23/2012   TRIG 147.0 05/04/2022   CHOLHDL 5 05/04/2022   No results found for: "LABRPR", "RPRTITER" No results found for: "HIV1RNAQUANT", "HIV1RNAVL", "CD4TABS"   Problem List Items Addressed This Visit   None  Assessment/Plan 67 year old female with a complicated past history including thoracic osteomyelitis with MRSE status post T8-11 instrumentation treated with dapto(did not tolerate doxy suppression) found to have  T4/5 osteo sp aspiration with cultures and path sent on 02/02/23   #T4/5 discitis/osteomyelitis status post aspiration with hardware in place - Tolerating antibiotics - OR Cx+ MRSE and discharged on daptomycin x 8 weeks 03/30/23.  Path showed benign bone with lymphoplasmacytic inflammation consistent with chronic  osteomyelitis -Suprresive doxy after IV treatment complete. She is amenable to trying suppression again as her last course was breif and unclear if related to abx as she had GI symptoms after one day of abx. If doxy not tolerated then, possible omada.   # Back pain -Pt states she has back pain as she does not have oxy following discharge. She is taking norco at baseline for fibromyalgia which is not helping with  her pain.  -She plans to follow-up with neurosurgery  #Atypical chest pain since surgery -Present  while inpatient.  Suspected likely secondary to thoracic discitis.  Troponin negative x 2, not consistent with ACS.  Patient reported PCP did an EKG outpatient which was unrevealing. -F/U with Cardiology  #Medication monitoring -Labs 02/12/23: wbc 6.5, Hgb 7.9, esre 71, crp 12(10 cutoff), ck 55/ IF labs worsen will have low threshold to reimage    Danelle Earthly, MD [facility] 02/19/2023, 4:07 PM   I have personally spent 47 minutes involved in face-to-face and non-face-to-face activities for this patient on the day of the visit. Professional time spent includes the following activities: Preparing to see the patient (review of tests), Obtaining and/or reviewing separately obtained history (admission/discharge record), Performing a medically appropriate examination and/or evaluation , Ordering medications/tests/procedures, referring and communicating with other health care professionals, Documenting clinical information in the EMR, Independently interpreting results (not separately reported), Communicating results to the patient/family/caregiver, Counseling and educating the patient/family/caregiver and Care coordination (not separately reported).

## 2023-02-19 NOTE — Telephone Encounter (Signed)
Spoke with husband and he did not speak with [facility] yet.  He will call them back.

## 2023-02-20 LAB — LAB REPORT - SCANNED: EGFR: 9

## 2023-02-20 NOTE — Telephone Encounter (Signed)
Spoke [facility] and she tried calling spoke husband but was unable to get consent because they did not have him on file.  They will try and call pt main number.  I will follow up with insurance tomorrow.

## 2023-02-21 ENCOUNTER — Inpatient Hospital Stay: Payer: PRIVATE HEALTH INSURANCE | Attending: Hematology & Oncology

## 2023-02-21 ENCOUNTER — Inpatient Hospital Stay: Payer: PRIVATE HEALTH INSURANCE

## 2023-02-22 ENCOUNTER — Inpatient Hospital Stay: Payer: PRIVATE HEALTH INSURANCE | Admitting: Internal Medicine

## 2023-02-22 NOTE — Telephone Encounter (Signed)
Left message on machine for pt to call back to schedule [facility] on or after 03/24/23.  Spoke with husband and asked him to have wife to call back.

## 2023-02-23 ENCOUNTER — Encounter: Payer: Self-pay | Admitting: Physical Medicine & Rehabilitation

## 2023-02-26 ENCOUNTER — Emergency Department (HOSPITAL_COMMUNITY): Payer: PRIVATE HEALTH INSURANCE

## 2023-02-26 ENCOUNTER — Inpatient Hospital Stay (HOSPITAL_COMMUNITY)
Admission: EM | Admit: 2023-02-26 | Discharge: 2023-04-30 | DRG: 477 | Disposition: E | Payer: PRIVATE HEALTH INSURANCE | Attending: Internal Medicine | Admitting: Internal Medicine

## 2023-02-26 ENCOUNTER — Other Ambulatory Visit: Payer: Self-pay

## 2023-02-26 DIAGNOSIS — R008 Other abnormalities of heart beat: Secondary | ICD-10-CM | POA: Diagnosis not present

## 2023-02-26 DIAGNOSIS — G4733 Obstructive sleep apnea (adult) (pediatric): Secondary | ICD-10-CM | POA: Diagnosis present

## 2023-02-26 DIAGNOSIS — A0472 Enterocolitis due to Clostridium difficile, not specified as recurrent: Secondary | ICD-10-CM | POA: Diagnosis present

## 2023-02-26 DIAGNOSIS — Z91013 Allergy to seafood: Secondary | ICD-10-CM

## 2023-02-26 DIAGNOSIS — I82503 Chronic embolism and thrombosis of unspecified deep veins of lower extremity, bilateral: Secondary | ICD-10-CM | POA: Diagnosis present

## 2023-02-26 DIAGNOSIS — D696 Thrombocytopenia, unspecified: Secondary | ICD-10-CM

## 2023-02-26 DIAGNOSIS — K922 Gastrointestinal hemorrhage, unspecified: Secondary | ICD-10-CM | POA: Diagnosis not present

## 2023-02-26 DIAGNOSIS — M4624 Osteomyelitis of vertebra, thoracic region: Secondary | ICD-10-CM | POA: Diagnosis not present

## 2023-02-26 DIAGNOSIS — Z7901 Long term (current) use of anticoagulants: Secondary | ICD-10-CM

## 2023-02-26 DIAGNOSIS — R6521 Severe sepsis with septic shock: Secondary | ICD-10-CM | POA: Diagnosis not present

## 2023-02-26 DIAGNOSIS — G4730 Sleep apnea, unspecified: Secondary | ICD-10-CM

## 2023-02-26 DIAGNOSIS — I953 Hypotension of hemodialysis: Secondary | ICD-10-CM | POA: Diagnosis not present

## 2023-02-26 DIAGNOSIS — E871 Hypo-osmolality and hyponatremia: Secondary | ICD-10-CM | POA: Diagnosis present

## 2023-02-26 DIAGNOSIS — F419 Anxiety disorder, unspecified: Secondary | ICD-10-CM | POA: Diagnosis not present

## 2023-02-26 DIAGNOSIS — F32A Depression, unspecified: Secondary | ICD-10-CM

## 2023-02-26 DIAGNOSIS — Z1624 Resistance to multiple antibiotics: Secondary | ICD-10-CM | POA: Diagnosis not present

## 2023-02-26 DIAGNOSIS — R54 Age-related physical debility: Secondary | ICD-10-CM | POA: Diagnosis present

## 2023-02-26 DIAGNOSIS — K7030 Alcoholic cirrhosis of liver without ascites: Secondary | ICD-10-CM

## 2023-02-26 DIAGNOSIS — N2581 Secondary hyperparathyroidism of renal origin: Secondary | ICD-10-CM | POA: Diagnosis present

## 2023-02-26 DIAGNOSIS — R112 Nausea with vomiting, unspecified: Principal | ICD-10-CM

## 2023-02-26 DIAGNOSIS — R509 Fever, unspecified: Secondary | ICD-10-CM

## 2023-02-26 DIAGNOSIS — Z880 Allergy status to penicillin: Secondary | ICD-10-CM

## 2023-02-26 DIAGNOSIS — K7460 Unspecified cirrhosis of liver: Secondary | ICD-10-CM | POA: Diagnosis present

## 2023-02-26 DIAGNOSIS — E874 Mixed disorder of acid-base balance: Secondary | ICD-10-CM | POA: Diagnosis present

## 2023-02-26 DIAGNOSIS — E11649 Type 2 diabetes mellitus with hypoglycemia without coma: Secondary | ICD-10-CM | POA: Diagnosis not present

## 2023-02-26 DIAGNOSIS — E440 Moderate protein-calorie malnutrition: Secondary | ICD-10-CM | POA: Diagnosis not present

## 2023-02-26 DIAGNOSIS — E1122 Type 2 diabetes mellitus with diabetic chronic kidney disease: Secondary | ICD-10-CM | POA: Diagnosis present

## 2023-02-26 DIAGNOSIS — N185 Chronic kidney disease, stage 5: Secondary | ICD-10-CM | POA: Diagnosis not present

## 2023-02-26 DIAGNOSIS — H548 Legal blindness, as defined in USA: Secondary | ICD-10-CM | POA: Diagnosis present

## 2023-02-26 DIAGNOSIS — Z886 Allergy status to analgesic agent status: Secondary | ICD-10-CM

## 2023-02-26 DIAGNOSIS — Z79899 Other long term (current) drug therapy: Secondary | ICD-10-CM

## 2023-02-26 DIAGNOSIS — Z833 Family history of diabetes mellitus: Secondary | ICD-10-CM

## 2023-02-26 DIAGNOSIS — G9340 Encephalopathy, unspecified: Secondary | ICD-10-CM

## 2023-02-26 DIAGNOSIS — J9811 Atelectasis: Secondary | ICD-10-CM | POA: Diagnosis not present

## 2023-02-26 DIAGNOSIS — D509 Iron deficiency anemia, unspecified: Secondary | ICD-10-CM | POA: Diagnosis present

## 2023-02-26 DIAGNOSIS — K219 Gastro-esophageal reflux disease without esophagitis: Secondary | ICD-10-CM | POA: Diagnosis present

## 2023-02-26 DIAGNOSIS — G8929 Other chronic pain: Secondary | ICD-10-CM

## 2023-02-26 DIAGNOSIS — Z515 Encounter for palliative care: Secondary | ICD-10-CM

## 2023-02-26 DIAGNOSIS — M4626 Osteomyelitis of vertebra, lumbar region: Secondary | ICD-10-CM | POA: Diagnosis present

## 2023-02-26 DIAGNOSIS — E1142 Type 2 diabetes mellitus with diabetic polyneuropathy: Secondary | ICD-10-CM | POA: Diagnosis present

## 2023-02-26 DIAGNOSIS — D689 Coagulation defect, unspecified: Secondary | ICD-10-CM | POA: Diagnosis present

## 2023-02-26 DIAGNOSIS — Z7984 Long term (current) use of oral hypoglycemic drugs: Secondary | ICD-10-CM

## 2023-02-26 DIAGNOSIS — Z6837 Body mass index (BMI) 37.0-37.9, adult: Secondary | ICD-10-CM

## 2023-02-26 DIAGNOSIS — K5900 Constipation, unspecified: Secondary | ICD-10-CM | POA: Diagnosis not present

## 2023-02-26 DIAGNOSIS — E860 Dehydration: Secondary | ICD-10-CM | POA: Diagnosis present

## 2023-02-26 DIAGNOSIS — E876 Hypokalemia: Secondary | ICD-10-CM | POA: Diagnosis not present

## 2023-02-26 DIAGNOSIS — G928 Other toxic encephalopathy: Secondary | ICD-10-CM | POA: Diagnosis not present

## 2023-02-26 DIAGNOSIS — Z9102 Food additives allergy status: Secondary | ICD-10-CM

## 2023-02-26 DIAGNOSIS — M869 Osteomyelitis, unspecified: Secondary | ICD-10-CM | POA: Diagnosis not present

## 2023-02-26 DIAGNOSIS — F4310 Post-traumatic stress disorder, unspecified: Secondary | ICD-10-CM | POA: Diagnosis present

## 2023-02-26 DIAGNOSIS — Z89431 Acquired absence of right foot: Secondary | ICD-10-CM

## 2023-02-26 DIAGNOSIS — Y92234 Operating room of hospital as the place of occurrence of the external cause: Secondary | ICD-10-CM | POA: Diagnosis not present

## 2023-02-26 DIAGNOSIS — Z881 Allergy status to other antibiotic agents status: Secondary | ICD-10-CM

## 2023-02-26 DIAGNOSIS — D631 Anemia in chronic kidney disease: Secondary | ICD-10-CM

## 2023-02-26 DIAGNOSIS — Z90721 Acquired absence of ovaries, unilateral: Secondary | ICD-10-CM

## 2023-02-26 DIAGNOSIS — E669 Obesity, unspecified: Secondary | ICD-10-CM

## 2023-02-26 DIAGNOSIS — R627 Adult failure to thrive: Secondary | ICD-10-CM | POA: Diagnosis not present

## 2023-02-26 DIAGNOSIS — E039 Hypothyroidism, unspecified: Secondary | ICD-10-CM | POA: Diagnosis present

## 2023-02-26 DIAGNOSIS — I5032 Chronic diastolic (congestive) heart failure: Secondary | ICD-10-CM | POA: Diagnosis present

## 2023-02-26 DIAGNOSIS — D649 Anemia, unspecified: Secondary | ICD-10-CM | POA: Diagnosis present

## 2023-02-26 DIAGNOSIS — Z8249 Family history of ischemic heart disease and other diseases of the circulatory system: Secondary | ICD-10-CM

## 2023-02-26 DIAGNOSIS — Z992 Dependence on renal dialysis: Secondary | ICD-10-CM

## 2023-02-26 DIAGNOSIS — Z91018 Allergy to other foods: Secondary | ICD-10-CM

## 2023-02-26 DIAGNOSIS — N183 Chronic kidney disease, stage 3 unspecified: Secondary | ICD-10-CM | POA: Diagnosis present

## 2023-02-26 DIAGNOSIS — Z888 Allergy status to other drugs, medicaments and biological substances status: Secondary | ICD-10-CM

## 2023-02-26 DIAGNOSIS — M4625 Osteomyelitis of vertebra, thoracolumbar region: Secondary | ICD-10-CM | POA: Diagnosis present

## 2023-02-26 DIAGNOSIS — M797 Fibromyalgia: Secondary | ICD-10-CM | POA: Diagnosis present

## 2023-02-26 DIAGNOSIS — I132 Hypertensive heart and chronic kidney disease with heart failure and with stage 5 chronic kidney disease, or end stage renal disease: Secondary | ICD-10-CM | POA: Diagnosis present

## 2023-02-26 DIAGNOSIS — T8619 Other complication of kidney transplant: Secondary | ICD-10-CM | POA: Diagnosis present

## 2023-02-26 DIAGNOSIS — I2489 Other forms of acute ischemic heart disease: Secondary | ICD-10-CM | POA: Diagnosis not present

## 2023-02-26 DIAGNOSIS — N186 End stage renal disease: Secondary | ICD-10-CM | POA: Diagnosis present

## 2023-02-26 DIAGNOSIS — D6959 Other secondary thrombocytopenia: Secondary | ICD-10-CM | POA: Diagnosis present

## 2023-02-26 DIAGNOSIS — Z91199 Patient's noncompliance with other medical treatment and regimen due to unspecified reason: Secondary | ICD-10-CM

## 2023-02-26 DIAGNOSIS — I10 Essential (primary) hypertension: Secondary | ICD-10-CM

## 2023-02-26 DIAGNOSIS — E875 Hyperkalemia: Secondary | ICD-10-CM | POA: Diagnosis not present

## 2023-02-26 DIAGNOSIS — Z981 Arthrodesis status: Secondary | ICD-10-CM

## 2023-02-26 DIAGNOSIS — R161 Splenomegaly, not elsewhere classified: Secondary | ICD-10-CM | POA: Diagnosis present

## 2023-02-26 DIAGNOSIS — I252 Old myocardial infarction: Secondary | ICD-10-CM

## 2023-02-26 DIAGNOSIS — L89629 Pressure ulcer of left heel, unspecified stage: Secondary | ICD-10-CM | POA: Diagnosis present

## 2023-02-26 DIAGNOSIS — E785 Hyperlipidemia, unspecified: Secondary | ICD-10-CM | POA: Diagnosis present

## 2023-02-26 DIAGNOSIS — I2510 Atherosclerotic heart disease of native coronary artery without angina pectoris: Secondary | ICD-10-CM | POA: Diagnosis present

## 2023-02-26 DIAGNOSIS — Z993 Dependence on wheelchair: Secondary | ICD-10-CM

## 2023-02-26 DIAGNOSIS — T402X1A Poisoning by other opioids, accidental (unintentional), initial encounter: Secondary | ICD-10-CM | POA: Diagnosis not present

## 2023-02-26 DIAGNOSIS — R578 Other shock: Secondary | ICD-10-CM | POA: Diagnosis not present

## 2023-02-26 DIAGNOSIS — G4700 Insomnia, unspecified: Secondary | ICD-10-CM | POA: Diagnosis present

## 2023-02-26 DIAGNOSIS — M546 Pain in thoracic spine: Secondary | ICD-10-CM

## 2023-02-26 DIAGNOSIS — Z841 Family history of disorders of kidney and ureter: Secondary | ICD-10-CM

## 2023-02-26 DIAGNOSIS — M40204 Unspecified kyphosis, thoracic region: Secondary | ICD-10-CM | POA: Diagnosis present

## 2023-02-26 DIAGNOSIS — E1165 Type 2 diabetes mellitus with hyperglycemia: Secondary | ICD-10-CM | POA: Diagnosis present

## 2023-02-26 DIAGNOSIS — Y830 Surgical operation with transplant of whole organ as the cause of abnormal reaction of the patient, or of later complication, without mention of misadventure at the time of the procedure: Secondary | ICD-10-CM | POA: Diagnosis present

## 2023-02-26 DIAGNOSIS — N390 Urinary tract infection, site not specified: Secondary | ICD-10-CM | POA: Diagnosis present

## 2023-02-26 DIAGNOSIS — Z66 Do not resuscitate: Secondary | ICD-10-CM | POA: Diagnosis present

## 2023-02-26 DIAGNOSIS — N170 Acute kidney failure with tubular necrosis: Secondary | ICD-10-CM | POA: Diagnosis present

## 2023-02-26 DIAGNOSIS — W04XXXA Fall while being carried or supported by other persons, initial encounter: Secondary | ICD-10-CM | POA: Diagnosis not present

## 2023-02-26 DIAGNOSIS — M4854XA Collapsed vertebra, not elsewhere classified, thoracic region, initial encounter for fracture: Secondary | ICD-10-CM | POA: Diagnosis present

## 2023-02-26 DIAGNOSIS — Z940 Kidney transplant status: Secondary | ICD-10-CM

## 2023-02-26 DIAGNOSIS — Z9049 Acquired absence of other specified parts of digestive tract: Secondary | ICD-10-CM

## 2023-02-26 DIAGNOSIS — E7800 Pure hypercholesterolemia, unspecified: Secondary | ICD-10-CM | POA: Diagnosis present

## 2023-02-26 DIAGNOSIS — Z90710 Acquired absence of both cervix and uterus: Secondary | ICD-10-CM

## 2023-02-26 DIAGNOSIS — J45909 Unspecified asthma, uncomplicated: Secondary | ICD-10-CM | POA: Diagnosis present

## 2023-02-26 DIAGNOSIS — Z8619 Personal history of other infectious and parasitic diseases: Secondary | ICD-10-CM

## 2023-02-26 DIAGNOSIS — Z7951 Long term (current) use of inhaled steroids: Secondary | ICD-10-CM

## 2023-02-26 DIAGNOSIS — J9601 Acute respiratory failure with hypoxia: Secondary | ICD-10-CM | POA: Diagnosis not present

## 2023-02-26 DIAGNOSIS — Z794 Long term (current) use of insulin: Secondary | ICD-10-CM

## 2023-02-26 LAB — URINALYSIS, ROUTINE W REFLEX MICROSCOPIC
Bilirubin Urine: NEGATIVE
Glucose, UA: 50 mg/dL — AB
Ketones, ur: NEGATIVE mg/dL
Nitrite: NEGATIVE
Protein, ur: 100 mg/dL — AB
Specific Gravity, Urine: 1.012 (ref 1.005–1.030)
WBC, UA: >50 WBC/hpf (ref 0–5)
pH: 5 (ref 5.0–8.0)

## 2023-02-26 LAB — CBC WITH DIFFERENTIAL/PLATELET
Abs Immature Granulocytes: 0.03 10*3/uL (ref 0.00–0.07)
Basophils Absolute: 0 10*3/uL (ref 0.0–0.1)
Basophils Relative: 1 %
Eosinophils Absolute: 0.1 10*3/uL (ref 0.0–0.5)
Eosinophils Relative: 1 %
HCT: 30.3 % — ABNORMAL LOW (ref 36.0–46.0)
Hemoglobin: 9.2 g/dL — ABNORMAL LOW (ref 12.0–15.0)
Immature Granulocytes: 0 %
Lymphocytes Relative: 7 %
Lymphs Abs: 0.6 10*3/uL — ABNORMAL LOW (ref 0.7–4.0)
MCH: 27.6 pg (ref 26.0–34.0)
MCHC: 30.4 g/dL (ref 30.0–36.0)
MCV: 91 fL (ref 80.0–100.0)
Monocytes Absolute: 0.2 10*3/uL (ref 0.1–1.0)
Monocytes Relative: 3 %
Neutro Abs: 6.6 10*3/uL (ref 1.7–7.7)
Neutrophils Relative %: 88 %
Platelets: 142 10*3/uL — ABNORMAL LOW (ref 150–400)
RBC: 3.33 MIL/uL — ABNORMAL LOW (ref 3.87–5.11)
RDW: 15.4 % (ref 11.5–15.5)
WBC: 7.5 10*3/uL (ref 4.0–10.5)
nRBC: 0 % (ref 0.0–0.2)

## 2023-02-26 LAB — COMPREHENSIVE METABOLIC PANEL
ALT: 9 U/L (ref 0–44)
AST: 12 U/L — ABNORMAL LOW (ref 15–41)
Albumin: 3.1 g/dL — ABNORMAL LOW (ref 3.5–5.0)
Alkaline Phosphatase: 157 U/L — ABNORMAL HIGH (ref 38–126)
Anion gap: 14 (ref 5–15)
BUN: 53 mg/dL — ABNORMAL HIGH (ref 8–23)
CO2: 16 mmol/L — ABNORMAL LOW (ref 22–32)
Calcium: 8.2 mg/dL — ABNORMAL LOW (ref 8.9–10.3)
Chloride: 109 mmol/L (ref 98–111)
Creatinine, Ser: 4.61 mg/dL — ABNORMAL HIGH (ref 0.44–1.00)
GFR, Estimated: 10 mL/min — ABNORMAL LOW (ref >60)
Glucose, Bld: 209 mg/dL — ABNORMAL HIGH (ref 70–99)
Potassium: 4.3 mmol/L (ref 3.5–5.1)
Sodium: 139 mmol/L (ref 135–145)
Total Bilirubin: 0.6 mg/dL (ref 0.3–1.2)
Total Protein: 8.3 g/dL — ABNORMAL HIGH (ref 6.5–8.1)

## 2023-02-26 LAB — AMMONIA: Ammonia: 31 umol/L (ref 9–35)

## 2023-02-26 LAB — LACTIC ACID, PLASMA: Lactic Acid, Venous: 1.1 mmol/L (ref 0.5–1.9)

## 2023-02-26 LAB — GLUCOSE, CAPILLARY
Glucose-Capillary: 139 mg/dL — ABNORMAL HIGH (ref 70–99)
Glucose-Capillary: 151 mg/dL — ABNORMAL HIGH (ref 70–99)

## 2023-02-26 LAB — LIPASE, BLOOD: Lipase: 52 U/L — ABNORMAL HIGH (ref 11–51)

## 2023-02-26 LAB — PROTIME-INR
INR: 1.3 — ABNORMAL HIGH (ref 0.8–1.2)
Prothrombin Time: 16 seconds — ABNORMAL HIGH (ref 11.4–15.2)

## 2023-02-26 LAB — TROPONIN I (HIGH SENSITIVITY)
Troponin I (High Sensitivity): 8 ng/L (ref <18)
Troponin I (High Sensitivity): 10 ng/L (ref <18)

## 2023-02-26 MED ORDER — HYDROCODONE-ACETAMINOPHEN 5-325 MG PO TABS
1.0000 | ORAL_TABLET | Freq: Four times a day (QID) | ORAL | Status: DC | PRN
  Administered 2023-02-26 – 2023-02-27 (×2): 1 via ORAL
  Filled 2023-02-26 (×2): qty 1

## 2023-02-26 MED ORDER — LIDOCAINE 5 % EX PTCH
2.0000 | MEDICATED_PATCH | CUTANEOUS | Status: DC
  Administered 2023-02-26 – 2023-03-29 (×26): 2 via TRANSDERMAL
  Filled 2023-02-26 (×29): qty 2

## 2023-02-26 MED ORDER — SODIUM CHLORIDE 0.9 % IV BOLUS (SEPSIS)
500.0000 mL | Freq: Once | INTRAVENOUS | Status: AC
  Administered 2023-02-26: 500 mL via INTRAVENOUS

## 2023-02-26 MED ORDER — POLYETHYLENE GLYCOL 3350 17 G PO PACK: 17.0000 g | PACK | Freq: Every day | ORAL | Status: DC | PRN

## 2023-02-26 MED ORDER — APIXABAN 2.5 MG PO TABS
2.5000 mg | ORAL_TABLET | Freq: Two times a day (BID) | ORAL | Status: DC
  Administered 2023-02-26 – 2023-02-28 (×4): 2.5 mg via ORAL
  Filled 2023-02-26 (×4): qty 1

## 2023-02-26 MED ORDER — CALCIUM CARBONATE ANTACID 500 MG PO CHEW
1.0000 | CHEWABLE_TABLET | Freq: Every day | ORAL | Status: DC
  Administered 2023-02-27 – 2023-04-01 (×29): 200 mg via ORAL
  Filled 2023-02-26 (×32): qty 1

## 2023-02-26 MED ORDER — FUROSEMIDE 20 MG PO TABS
20.0000 mg | ORAL_TABLET | Freq: Two times a day (BID) | ORAL | Status: DC
  Administered 2023-02-26 – 2023-02-27 (×3): 20 mg via ORAL
  Filled 2023-02-26 (×3): qty 1

## 2023-02-26 MED ORDER — DIAZEPAM 5 MG PO TABS
5.0000 mg | ORAL_TABLET | Freq: Two times a day (BID) | ORAL | Status: DC | PRN
  Administered 2023-02-27 – 2023-03-28 (×20): 5 mg via ORAL
  Filled 2023-02-26 (×21): qty 1

## 2023-02-26 MED ORDER — SODIUM CHLORIDE 0.9 % IV SOLN
8.0000 mg/kg | INTRAVENOUS | Status: AC
  Administered 2023-02-27 – 2023-03-31 (×17): 650 mg via INTRAVENOUS
  Filled 2023-02-26 (×19): qty 13

## 2023-02-26 MED ORDER — HYDROMORPHONE HCL 1 MG/ML IJ SOLN
0.5000 mg | Freq: Once | INTRAMUSCULAR | Status: AC
  Administered 2023-02-26: 0.5 mg via INTRAVENOUS
  Filled 2023-02-26: qty 1

## 2023-02-26 MED ORDER — ACETAMINOPHEN 325 MG PO TABS
650.0000 mg | ORAL_TABLET | Freq: Once | ORAL | Status: AC
  Administered 2023-02-26: 650 mg via ORAL
  Filled 2023-02-26: qty 2

## 2023-02-26 MED ORDER — INSULIN ASPART 100 UNIT/ML IJ SOLN
0.0000 [IU] | Freq: Three times a day (TID) | INTRAMUSCULAR | Status: DC
  Administered 2023-02-26: 2 [IU] via SUBCUTANEOUS

## 2023-02-26 MED ORDER — SODIUM CHLORIDE 0.9 % IV SOLN
12.5000 mg | Freq: Once | INTRAVENOUS | Status: AC
  Administered 2023-02-26: 12.5 mg via INTRAVENOUS
  Filled 2023-02-26: qty 12.5

## 2023-02-26 MED ORDER — CARVEDILOL 6.25 MG PO TABS
6.2500 mg | ORAL_TABLET | Freq: Two times a day (BID) | ORAL | Status: DC
  Administered 2023-02-26 – 2023-03-12 (×25): 6.25 mg via ORAL
  Filled 2023-02-26 (×27): qty 1

## 2023-02-26 MED ORDER — LORAZEPAM 2 MG/ML IJ SOLN
1.0000 mg | Freq: Once | INTRAMUSCULAR | Status: AC
  Administered 2023-02-26: 1 mg via INTRAVENOUS
  Filled 2023-02-26: qty 1

## 2023-02-26 MED ORDER — ALBUTEROL SULFATE (2.5 MG/3ML) 0.083% IN NEBU
2.5000 mg | INHALATION_SOLUTION | Freq: Four times a day (QID) | RESPIRATORY_TRACT | Status: DC | PRN
  Administered 2023-03-27 – 2023-04-01 (×5): 2.5 mg via RESPIRATORY_TRACT
  Filled 2023-02-26 (×5): qty 3

## 2023-02-26 MED ORDER — SODIUM CHLORIDE 0.9 % IV SOLN
2.0000 g | Freq: Once | INTRAVENOUS | Status: AC
  Administered 2023-02-26: 2 g via INTRAVENOUS
  Filled 2023-02-26: qty 20

## 2023-02-26 MED ORDER — DULOXETINE HCL 60 MG PO CPEP
60.0000 mg | ORAL_CAPSULE | Freq: Every day | ORAL | Status: DC
  Administered 2023-02-27 – 2023-04-03 (×32): 60 mg via ORAL
  Filled 2023-02-26 (×33): qty 1

## 2023-02-26 MED ORDER — HYDROMORPHONE HCL 1 MG/ML IJ SOLN
0.5000 mg | INTRAMUSCULAR | Status: DC | PRN
  Administered 2023-02-26 – 2023-02-27 (×6): 1 mg via INTRAVENOUS
  Filled 2023-02-26 (×7): qty 1

## 2023-02-26 MED ORDER — ONDANSETRON HCL 4 MG/2ML IJ SOLN
4.0000 mg | Freq: Once | INTRAMUSCULAR | Status: AC
  Administered 2023-02-26: 4 mg via INTRAVENOUS
  Filled 2023-02-26: qty 2

## 2023-02-26 MED ORDER — ONDANSETRON HCL 4 MG/2ML IJ SOLN
4.0000 mg | Freq: Four times a day (QID) | INTRAMUSCULAR | Status: DC | PRN
  Administered 2023-02-26 – 2023-02-27 (×4): 4 mg via INTRAVENOUS
  Filled 2023-02-26 (×4): qty 2

## 2023-02-26 MED ORDER — SODIUM CHLORIDE 0.9% FLUSH
3.0000 mL | Freq: Two times a day (BID) | INTRAVENOUS | Status: DC
  Administered 2023-02-26 – 2023-04-05 (×60): 3 mL via INTRAVENOUS

## 2023-02-26 MED ORDER — PREGABALIN 75 MG PO CAPS
75.0000 mg | ORAL_CAPSULE | Freq: Two times a day (BID) | ORAL | Status: DC
  Administered 2023-02-27: 75 mg via ORAL
  Filled 2023-02-26 (×2): qty 1

## 2023-02-26 MED ORDER — MOMETASONE FURO-FORMOTEROL FUM 200-5 MCG/ACT IN AERO
2.0000 | INHALATION_SPRAY | Freq: Two times a day (BID) | RESPIRATORY_TRACT | Status: DC
  Administered 2023-02-27 – 2023-04-01 (×46): 2 via RESPIRATORY_TRACT
  Filled 2023-02-26 (×2): qty 8.8

## 2023-02-26 NOTE — ED Provider Notes (Signed)
[place] [facility]ERGENCY DEPARTMENT AT [facility] Provider Note   CSN: [id number] Arrival date & time: 02/26/23  4098     History  Chief Complaint  Patient presents with   Abdominal Pain   Back Pain    [name] [name] is a 67 y.o. adult. 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  Patient recently discharged from the hospital after having T4-T5 discitis osteomyelitis.  She was discharged home where she is currently residing.  She is still getting IV daptomycin through her central line.  Patient has been complaining of pain in her back since surgery however she endorses increasing pain mainly in her upper back and chest regions over the past few days.  She is pointing in her trapezius region bilaterally endorsing increased swelling at this site.  She reports hot sweats and chills at home but no documented fevers.  She has had associated nonbloody nonbilious emesis with her pain.  She is complaining of diffuse abdominal pain but no localization or reported localization on exam.  She reports peeing less but no pain with urination.  Denying any skin changes at site of surgery or indwelling central line.  She presented hypertensive but endorses taking her blood pressure medicine earlier today.  Denies any alcohol or drug use at home other than her home pain meds of hydrocodone.   Abdominal Pain Back Pain Associated symptoms: abdominal pain        Home Medications Prior to Admission medications   Medication Sig Start Date End Date Taking? Authorizing Provider  acetaminophen (TYLENOL) 325 MG tablet Take 2 tablets (650 mg total) by mouth every 6 (six) hours as needed for mild pain, fever or headache (or Fever >/= 101). Discontinue  500/1000 mg dosing Patient taking differently: Take 650 mg by mouth every 8 (eight) hours as needed for mild pain, fever or headache (or Fever >/= 101). Discontinue 500/1000 mg dosing 01/09/22  Yes Azucena Fallen, MD  albuterol (VENTOLIN HFA) 108 (90 Base) MCG/ACT inhaler Inhale 2 puffs into the lungs every 6 (six) hours as needed for wheezing or shortness of breath. 02/14/22  Yes Donato Schultz, DO  apixaban (ELIQUIS) 2.5 MG TABS tablet Take 1 tablet (2.5 mg total) by mouth 2 (two) times daily. 02/03/23 03/05/23 Yes Dahal, Melina Schools, MD  calcium carbonate (TUMS - DOSED IN MG ELEMENTAL CALCIUM) 500 MG chewable tablet Chew 1 tablet by mouth daily.   Yes [provider]  carvedilol (COREG) 6.25 MG tablet Take 1 tablet (6.25 mg total) by mouth 2 (two) times daily with a meal. 02/03/23 03/05/23 Yes Dahal, Melina Schools, MD  daptomycin (CUBICIN) IVPB Inject 650 mg into the vein every other day. Indication:  vertebral osteomyelitis First Dose: No Last Day of Therapy:  03/30/2023 Labs - Once weekly:  CBC/D, BMP, and CPK Labs - Every other week:  ESR and CRP Method of administration: IV Push Method of administration may be changed at the discretion of home infusion pharmacist based upon assessment of the patient and/or caregiver's ability to self-administer the medication ordered. 02/03/23 03/31/23 Yes Dahal, Melina Schools, MD  diazepam (VALIUM) 5 MG tablet Take 1 tablet (5 mg total) by mouth every 12 (twelve) hours as needed for anxiety. 11/14/22  Yes Seabron Spates R, DO  DULoxetine (CYMBALTA) 60 MG capsule Take 1  capsule (60 mg total) by mouth daily. 12/20/22  Yes Lowne Chase, Yvonne R, DO  EVENITY 105 MG/1. SOSY injection Inject 210 mg into the skin once. 02/20/23  Yes [provider]  ferrous sulfate 325 (65 FE) MG tablet Take 1 tablet (325 mg total) by mouth 2 (two) times daily with a meal. Patient taking differently: Take 325 mg by mouth every other day. 10/22/21  Yes Leroy Sea, MD   furosemide (LASIX) 20 MG tablet Take 20 mg by mouth 2 (two) times daily. 02/25/23  Yes [provider]  HYDROcodone-acetaminophen (NORCO) 5-325 MG tablet Take 1 tablet by mouth every 6 (six) hours as needed for moderate pain. 02/16/23 03/18/23 Yes Donato Schultz, DO  Multiple Vitamin (MULTIVITAMIN WITH MINERALS) TABS tablet Take 1 tablet by mouth daily. 02/18/20  Yes Angiulli, Mcarthur Rossetti, PA-C  ondansetron (ZOFRAN) 8 MG tablet TAKE 1/2 (ONE-HALF) TABLET BY MOUTH EVERY 8 HOURS AS NEEDED FOR NAUSEA FOR VOMITING Patient taking differently: Take 4 mg by mouth every 8 (eight) hours as needed for nausea or vomiting. 11/06/22  Yes Seabron Spates R, DO  pregabalin (LYRICA) 75 MG capsule Take 1 capsule (75 mg total) by mouth 3 (three) times daily. Patient taking differently: Take 75 mg by mouth 2 (two) times daily. 11/09/22  Yes Seabron Spates R, DO  VITAMIN D PO Take 500 mcg by mouth daily.   Yes [provider]  fluticasone-salmeterol (ADVAIR HFA) 115-21 MCG/ACT inhaler Inhale 2 puffs into the lungs 2 (two) times daily. Patient not taking: Reported on 02/26/2023 02/14/22   Zola Button, Grayling Congress, DO  lubiprostone (AMITIZA) 24 MCG capsule Take 1 capsule (24 mcg total) by mouth 2 (two) times daily with a meal. Patient not taking: Reported on 02/26/2023 05/04/22   Seabron Spates R, DO  sodium bicarbonate 650 MG tablet Take 1 tablet (650 mg total) by mouth 2 (two) times daily. Patient not taking: Reported on 02/26/2023 07/11/22   Marinda Elk, MD  tamsulosin (FLOMAX) 0.4 MG CAPS capsule Take 1 capsule (0.4 mg total) by mouth daily. Patient not taking: Reported on 01/29/2023 07/11/22   Marinda Elk, MD  metFORMIN (GLUCOPHAGE) 1000 MG tablet Take 1,000 mg by mouth 2 (two) times daily with a meal.    12/19/21  [provider]  omeprazole (PRILOSEC) 20 MG capsule Take 20 mg by mouth daily.    12/19/21  [provider]      Allergies    Bee pollen, Broccoli  [place] oleracea], Fish-derived products, Mushroom extract complex, Penicillins, Rosemary oil, Shellfish allergy, Tomato, Acetaminophen, Aloe vera, Doxycycline, Acyclovir and related, and Naproxen    Review of Systems   Review of Systems  Gastrointestinal:  Positive for abdominal pain.  Musculoskeletal:  Positive for back pain.    Physical Exam Updated Vital Signs BP (!) 195/94 (BP Location: Left Arm)   Pulse 90   Temp 97.7 F (36.5 C) (Oral)   Resp 17   Ht 4\' 11"  (1.499 m)   Wt 82.1 kg   SpO2 100%   BMI 36.56 kg/m  Physical Exam Constitutional: Alert and oriented.  Uncomfortable in appearance chronically ill-appearing but nontoxic Eyes: Conjunctivae are normal. ENT      Head: Normocephalic and atraumatic. Cardiovascular: S1, S2, regular rate and rhythm equal palpable bilateral radial and DP pulses warm well-perfused Indwelling right-sided tunneled central line clean dry and intact no surrounding erythema no discharge,  Respiratory: Normal respiratory effort. Breath sounds are normal.  O2 sat 100 on RA Gastrointestinal: Obese but soft.  Reporting diffuse tenderness but no localization on exam.  No rebound no guarding. Musculoskeletal: Moving all 4 extremities against gravity Trace pitting edema of left lower ankle.  Significant tenderness to bilateral trapezius region with nonpitting edema.  No erythema present no warmth present.  No fluctuance present.  Multiple surgical scars of the T and L-spine from previous surgeries are clean and dry with no active discharge or erythema or warmth. Neurologic: Normal speech and language.  Moving all 4 extremities against gravity reporting no sensation loss.  No facial droop.  No gross focal neurologic deficits are appreciated. Skin: Skin is warm, dry . Mild jaundice Psychiatric: Mood and affect are normal. Speech and behavior are normal.  ED Results / Procedures / Treatments   Labs (all labs ordered are listed, but only abnormal results  are displayed) Labs Reviewed  CBC WITH DIFFERENTIAL/PLATELET - Abnormal; Notable for the following components:      Result Value   RBC 3.33 (*)    Hemoglobin 9.2 (*)    HCT 30.3 (*)    Platelets 142 (*)    Lymphs Abs 0.6 (*)    All other components within normal limits  COMPREHENSIVE METABOLIC PANEL - Abnormal; Notable for the following components:   CO2 16 (*)    Glucose, Bld 209 (*)    BUN 53 (*)    Creatinine, Ser 4.61 (*)    Calcium 8.2 (*)    Total Protein 8.3 (*)    Albumin 3.1 (*)    AST 12 (*)    Alkaline Phosphatase 157 (*)    GFR, Estimated 10 (*)    All other components within normal limits  LIPASE, BLOOD - Abnormal; Notable for the following components:   Lipase 52 (*)    All other components within normal limits  PROTIME-INR - Abnormal; Notable for the following components:   Prothrombin Time 16.0 (*)    INR 1.3 (*)    All other components within normal limits  URINALYSIS, ROUTINE W REFLEX MICROSCOPIC - Abnormal; Notable for the following components:   APPearance HAZY (*)    Glucose, UA 50 (*)    Hgb urine dipstick MODERATE (*)    Protein, ur 100 (*)    Leukocytes,Ua MODERATE (*)    Bacteria, UA FEW (*)    All other components within normal limits  GLUCOSE, CAPILLARY - Abnormal; Notable for the following components:   Glucose-Capillary 139 (*)    All other components within normal limits  CULTURE, BLOOD (ROUTINE X 2)  CULTURE, BLOOD (ROUTINE X 2)  LACTIC ACID, PLASMA  AMMONIA  SEDIMENTATION RATE  C-REACTIVE PROTEIN  COMPREHENSIVE METABOLIC PANEL  CBC  CK  TROPONIN I (HIGH SENSITIVITY)  TROPONIN I (HIGH SENSITIVITY)    EKG None  Radiology CT CHEST ABDOMEN PELVIS WO CONTRAST  Result Date: 02/26/2023 CLINICAL DATA:  Upper back pain. EXAM: CT CHEST, ABDOMEN AND PELVIS WITHOUT CONTRAST TECHNIQUE: Multidetector CT imaging of the chest, abdomen and pelvis was performed following the standard protocol without IV contrast. RADIATION DOSE REDUCTION: This  exam was performed according to the departmental dose-optimization program which includes automated exposure control, adjustment of the mA and/or kV according to patient size and/or use of iterative reconstruction technique. COMPARISON:  June 20, 2022.  January 28, 2023. FINDINGS: CT CHEST FINDINGS Cardiovascular: Atherosclerosis of thoracic aorta is noted without aneurysm or dissection. Normal cardiac size. No pericardial effusion. Coronary artery calcifications are noted. Mediastinum/Nodes: No enlarged mediastinal,  hilar, or axillary lymph nodes. Thyroid gland, trachea, and esophagus demonstrate no significant findings. Lungs/Pleura: No pneumothorax or pleural effusion is noted. Minimal bibasilar subsegmental atelectasis is noted. Musculoskeletal: Erosive changes are again seen involving the T4-5 disc space as noted on prior CT scan most likely representing sequela of prior osteomyelitis and discitis at this level. There is also noted continued abnormal soft tissue density in the paraspinal region at this level. There does appear to be increased resorption of the T5 vertebral body and acute osteomyelitis cannot be excluded. Status post surgical posterior fusion extending from T8 to L3. CT ABDOMEN PELVIS FINDINGS Hepatobiliary: Status post cholecystectomy. Consistent with hepatic cirrhosis. No biliary dilatation is noted. Pancreas: Unremarkable. No pancreatic ductal dilatation or surrounding inflammatory changes. Spleen: Normal in size without focal abnormality. Adrenals/Urinary Tract: Adrenal glands are unremarkable. Kidneys are normal, without renal calculi, focal lesion, or hydronephrosis. Bladder is unremarkable. Stomach/Bowel: Stomach is within normal limits. Appendix appears normal. No evidence of bowel wall thickening, distention, or inflammatory changes. Vascular/Lymphatic: Aortic atherosclerosis. No enlarged abdominal or pelvic lymph nodes. Reproductive: Status post hysterectomy. No adnexal masses. Other:  No abdominal wall hernia or abnormality. No abdominopelvic ascites. Musculoskeletal: Status post surgical fusion extending from thoracic spine to L3 level, with probable corpectomy present at L1. No acute osseous abnormality is noted. IMPRESSION: Erosive changes are again seen involving the T4-5 disc space as noted on prior CT scan most likely sequela of prior osteomyelitis and discitis. Continued abnormal soft tissue density is noted in paraspinal region as well. There does appear to be increased resorption of T5 vertebral body and acute osteomyelitis cannot be excluded. MRI may be performed for further evaluation. Hepatic cirrhosis.  Status post cholecystectomy. Extensive postsurgical changes are seen involving the thoracic and lumbar spine as described above, with probable corpectomy present at L1. Coronary artery calcifications are noted. Aortic Atherosclerosis (ICD10-I70.0). Electronically Signed   By: Lupita Raider M.D.   On: 02/26/2023 13:36    Procedures Procedures    Medications Ordered in ED Medications  lidocaine (LIDODERM) 5 % 2 patch (2 patches Transdermal Patch Applied 02/26/23 1104)  HYDROcodone-acetaminophen (NORCO/VICODIN) 5-325 MG per tablet 1 tablet (has no administration in time range)  carvedilol (COREG) tablet 6.25 mg (has no administration in time range)  furosemide (LASIX) tablet 20 mg (has no administration in time range)  DULoxetine (CYMBALTA) DR capsule 60 mg (has no administration in time range)  diazepam (VALIUM) tablet 5 mg (has no administration in time range)  calcium carbonate (TUMS - dosed in mg elemental calcium) chewable tablet 200 mg of elemental calcium (has no administration in time range)  apixaban (ELIQUIS) tablet 2.5 mg (has no administration in time range)  pregabalin (LYRICA) capsule 75 mg (has no administration in time range)  mometasone-formoterol (DULERA) 200-5 MCG/ACT inhaler 2 puff (has no administration in time range)  albuterol (PROVENTIL) (2.5  MG/3ML) 0.083% nebulizer solution 2.5 mg (has no administration in time range)  sodium chloride flush (NS) 0.9 % injection 3 mL (3 mLs Intravenous Given 02/26/23 1513)  polyethylene glycol (MIRALAX / GLYCOLAX) packet 17 g (has no administration in time range)  insulin aspart (novoLOG) injection 0-15 Units (has no administration in time range)  HYDROmorphone (DILAUDID) injection 0.5-1 mg (1 mg Intravenous Given 02/26/23 1544)  ondansetron (ZOFRAN) injection 4 mg (4 mg Intravenous Given 02/26/23 1700)  DAPTOmycin (CUBICIN) 650 mg in sodium chloride 0.9 % IVPB (has no administration in time range)  promethazine (PHENERGAN) 12.5 mg in sodium chloride 0.9 % 50  mL IVPB (has no administration in time range)  HYDROmorphone (DILAUDID) injection 0.5 mg (0.5 mg Intravenous Given 02/26/23 0832)  ondansetron (ZOFRAN) injection 4 mg (4 mg Intravenous Given 02/26/23 0832)  sodium chloride 0.9 % bolus 500 mL (0 mLs Intravenous Stopped 02/26/23 1244)  HYDROmorphone (DILAUDID) injection 0.5 mg (0.5 mg Intravenous Given 02/26/23 1105)  acetaminophen (TYLENOL) tablet 650 mg (650 mg Oral Given 02/26/23 1104)  cefTRIAXone (ROCEPHIN) 2 g in sodium chloride 0.9 % 100 mL IVPB (0 g Intravenous Stopped 02/26/23 1510)  ondansetron (ZOFRAN) injection 4 mg (4 mg Intravenous Given 02/26/23 1513)  LORazepam (ATIVAN) injection 1 mg (1 mg Intravenous Given 02/26/23 1745)    ED Course/ Medical Decision Making/ A&P                             Medical Decision Making  [name] [name] is a 67 y.o. adult. 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  Patient recently discharged from the hospital after having T4-T5 discitis osteomyelitis.   Patient has pain mainly in her upper  thoracic region and paraspinal region as well as bilateral trapezius.  Her surgical wounds appear clean dry and intact and skin exam does not seem concerning for associated cellulitis.  She is presenting hemodynamically stable, not concern for active sepsis or shock.  She has no new focal deficits from baseline neurologic exam.  I am not concerned clinically for dissection with no new neurologic or pulse deficits and reproducible pain.  Her abdominal exam is quite benign, doubt intra-abdominal pathology or SBP based off abdominal exam.  Labs reviewed by me with reassuring initial lactate 1.1 with also reassuring white blood cell count 7.5 no left shift present.  Chronic anemia hemoglobin 9.2 and thrombocytopenia platelet 142 in the setting of known cirrhosis.  Troponins unremarkable, not concern for ACS.  Creatinine 4.61 at baseline.  Lipase slightly elevated 52 not consistent with acute pancreatitis.  CT chest abdomen pelvis without contrast obtained suggestive of T4-T5 osteomyelitis and discitis which is known as well as possible new acute osteomyelitis of T5.  Currently on IV daptomycin also ordered for IV Rocephin for possible UTI UA consistent with moderate hemoglobin proteinuria few bacteria greater than 50 WBCs and no squames present with moderate leukocyte esterase.  Discussed with Dr. Alinda Money for further inpatient workup and discussion with infectious disease for possible new acute on chronic osteomyelitis, UTI management, pain and nausea management.  Amount and/or Complexity of Data Reviewed Labs: ordered. Radiology: ordered.  Risk OTC drugs. Prescription drug management. Decision regarding hospitalization.      Final Clinical Impression(s) / ED Diagnoses Final diagnoses:  Nausea and vomiting, unspecified vomiting type  Chronic thoracic back pain, unspecified back pain laterality  Urinary tract infection without hematuria, site unspecified    Rx / DC Orders ED Discharge Orders      None         Mardene Sayer, MD 02/26/23 1820

## 2023-02-26 NOTE — Progress Notes (Signed)
Pharmacy Antibiotic Note  [name] [name] is a 67 y.o. adult admitted on 02/26/2023 with  osteomyelitis .  Pharmacy has been consulted for daptomycin dosing.  Patient was recently discharged 4/7 after being hospitalized with T4/5 osteomyelitis. She was discharged with IV daptomycin x 8 weeks, per ID, with an end date set for 03/30/23. Due to the patient's poor renal function, she has been receiving IV daptomycin 8mg /kg IV q48hrs while outpatient. Pharmacy will continue this dose inpatient. Patient reported last dose of IV daptomycin was 4/28 ~1600-1700.   Plan: - Start IV daptomycin 8mg /kg q48hrs on 4/30 @1630   - Check CK tomorrow morning (4/30) and then weekly while on daptomycin  - Monitor renal function and overall clinical picture   Height: 4\' 11"  (149.9 cm) Weight: 82.1 kg (181 lb) IBW/kg (Calculated) : 43.2  Temp (24hrs), Avg:97.9 F (36.6 C), Min:97.7 F (36.5 C), Max:98.1 F (36.7 C)  Recent Labs  Lab 02/26/23 0825  WBC 7.5  CREATININE 4.61*  LATICACIDVEN 1.1    Estimated Creatinine Clearance (by C-G formula based on SCr of 4.61 mg/dL (H)) Female: 11 mL/min (A) Female: 13.5 mL/min (A)    Allergies  Allergen Reactions   Bee Pollen Anaphylaxis   Broccoli [[place] Oleracea] Anaphylaxis and Hives   Fish-Derived Products Anaphylaxis and Hives   Mushroom Extract Complex Anaphylaxis   Penicillins Anaphylaxis    **Tolerated cefepime March 2021 Did it involve swelling of the face/tongue/throat, SOB, or low BP? Yes Did it involve sudden or severe rash/hives, skin peeling, or any reaction on the inside of your mouth or nose? No Did you need to seek medical attention at a hospital or doctor's office? Yes When did it last happen?  A few months ago If all above answers are "NO", may proceed with cephalosporin use.   Rosemary Oil Anaphylaxis   Shellfish Allergy Anaphylaxis and Hives   Tomato Anaphylaxis and Hives    Raw tomatoes only, can tolerate if cooked   Acetaminophen  Other (See Comments)    GI upset   Aloe Vera Hives   Doxycycline Diarrhea   Acyclovir And Related Other (See Comments)    Unknown reaction   Naproxen Hives    Antimicrobials this admission: 4/29 ceftriaxone 2g x1  4/5 daptomycin >> (5/31)  Dose adjustments this admission: N/A  Microbiology results: 4/29 BCx: in process    Thank you for allowing pharmacy to be a part of this patient's care.  Cherylin Mylar, PharmD PGY1 Pharmacy Resident 4/29/20244:25 PM

## 2023-02-26 NOTE — H&P (Signed)
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 (Director on Call) for the Hospitalists listed on amion for assistance.  02/26/2023, 2:50 PM

## 2023-02-26 NOTE — Progress Notes (Signed)
Pt says she goes to the [facility] weekly for the left heel wound and has weekly home health. Wound is 1.5 x 1.5 x 1.0 cm with some tan drainage. Applied xeroform gauze after cleansing with normal saline, 2x2 4x4 and wrapped with kerlix. Will ask for WOC consult and will order a Prevalon boot for that foot.

## 2023-02-26 NOTE — ED Triage Notes (Signed)
Chief Complaint  Patient presents with   Abdominal Pain   Back Pain   Pt presents to ED from home with above complaint.  Pt endorses abdominal/back pain for 1-2 weeks; reports back infection with treatment through PICC line.  Per EMS, abdomen rigid/distended.  Pt states it has been this way about a week.  Pt wears 3L [place] at baseline.

## 2023-02-26 NOTE — ED Notes (Signed)
Got patient hooked up to the monitor did EKG shown to er provider patient is resting with call bell in reach and family at bedside

## 2023-02-26 NOTE — Plan of Care (Signed)
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
## 2023-02-27 ENCOUNTER — Observation Stay (HOSPITAL_COMMUNITY): Payer: PRIVATE HEALTH INSURANCE

## 2023-02-27 DIAGNOSIS — J9600 Acute respiratory failure, unspecified whether with hypoxia or hypercapnia: Secondary | ICD-10-CM | POA: Diagnosis not present

## 2023-02-27 DIAGNOSIS — I25119 Atherosclerotic heart disease of native coronary artery with unspecified angina pectoris: Secondary | ICD-10-CM | POA: Diagnosis not present

## 2023-02-27 DIAGNOSIS — Z515 Encounter for palliative care: Secondary | ICD-10-CM | POA: Diagnosis not present

## 2023-02-27 DIAGNOSIS — E669 Obesity, unspecified: Secondary | ICD-10-CM | POA: Diagnosis not present

## 2023-02-27 DIAGNOSIS — D6959 Other secondary thrombocytopenia: Secondary | ICD-10-CM | POA: Diagnosis present

## 2023-02-27 DIAGNOSIS — I2510 Atherosclerotic heart disease of native coronary artery without angina pectoris: Secondary | ICD-10-CM | POA: Diagnosis not present

## 2023-02-27 DIAGNOSIS — G928 Other toxic encephalopathy: Secondary | ICD-10-CM | POA: Diagnosis not present

## 2023-02-27 DIAGNOSIS — I5032 Chronic diastolic (congestive) heart failure: Secondary | ICD-10-CM | POA: Diagnosis not present

## 2023-02-27 DIAGNOSIS — Z992 Dependence on renal dialysis: Secondary | ICD-10-CM | POA: Diagnosis not present

## 2023-02-27 DIAGNOSIS — R578 Other shock: Secondary | ICD-10-CM | POA: Diagnosis not present

## 2023-02-27 DIAGNOSIS — N170 Acute kidney failure with tubular necrosis: Secondary | ICD-10-CM | POA: Diagnosis present

## 2023-02-27 DIAGNOSIS — N185 Chronic kidney disease, stage 5: Secondary | ICD-10-CM | POA: Diagnosis not present

## 2023-02-27 DIAGNOSIS — Y92234 Operating room of hospital as the place of occurrence of the external cause: Secondary | ICD-10-CM | POA: Diagnosis not present

## 2023-02-27 DIAGNOSIS — M4624 Osteomyelitis of vertebra, thoracic region: Secondary | ICD-10-CM | POA: Diagnosis not present

## 2023-02-27 DIAGNOSIS — R079 Chest pain, unspecified: Secondary | ICD-10-CM | POA: Diagnosis not present

## 2023-02-27 DIAGNOSIS — N189 Chronic kidney disease, unspecified: Secondary | ICD-10-CM | POA: Diagnosis not present

## 2023-02-27 DIAGNOSIS — K5900 Constipation, unspecified: Secondary | ICD-10-CM | POA: Diagnosis not present

## 2023-02-27 DIAGNOSIS — I953 Hypotension of hemodialysis: Secondary | ICD-10-CM | POA: Diagnosis not present

## 2023-02-27 DIAGNOSIS — F419 Anxiety disorder, unspecified: Secondary | ICD-10-CM | POA: Diagnosis not present

## 2023-02-27 DIAGNOSIS — R6521 Severe sepsis with septic shock: Secondary | ICD-10-CM | POA: Diagnosis not present

## 2023-02-27 DIAGNOSIS — A0472 Enterocolitis due to Clostridium difficile, not specified as recurrent: Secondary | ICD-10-CM | POA: Diagnosis present

## 2023-02-27 DIAGNOSIS — E11649 Type 2 diabetes mellitus with hypoglycemia without coma: Secondary | ICD-10-CM | POA: Diagnosis not present

## 2023-02-27 DIAGNOSIS — Y830 Surgical operation with transplant of whole organ as the cause of abnormal reaction of the patient, or of later complication, without mention of misadventure at the time of the procedure: Secondary | ICD-10-CM | POA: Diagnosis present

## 2023-02-27 DIAGNOSIS — E1165 Type 2 diabetes mellitus with hyperglycemia: Secondary | ICD-10-CM | POA: Diagnosis not present

## 2023-02-27 DIAGNOSIS — E875 Hyperkalemia: Secondary | ICD-10-CM | POA: Diagnosis not present

## 2023-02-27 DIAGNOSIS — D631 Anemia in chronic kidney disease: Secondary | ICD-10-CM | POA: Diagnosis not present

## 2023-02-27 DIAGNOSIS — K7460 Unspecified cirrhosis of liver: Secondary | ICD-10-CM | POA: Diagnosis present

## 2023-02-27 DIAGNOSIS — T8619 Other complication of kidney transplant: Secondary | ICD-10-CM | POA: Diagnosis present

## 2023-02-27 DIAGNOSIS — E1169 Type 2 diabetes mellitus with other specified complication: Secondary | ICD-10-CM | POA: Diagnosis not present

## 2023-02-27 DIAGNOSIS — E039 Hypothyroidism, unspecified: Secondary | ICD-10-CM | POA: Diagnosis present

## 2023-02-27 DIAGNOSIS — F1090 Alcohol use, unspecified, uncomplicated: Secondary | ICD-10-CM | POA: Diagnosis not present

## 2023-02-27 DIAGNOSIS — D689 Coagulation defect, unspecified: Secondary | ICD-10-CM | POA: Diagnosis present

## 2023-02-27 DIAGNOSIS — K7030 Alcoholic cirrhosis of liver without ascites: Secondary | ICD-10-CM | POA: Diagnosis not present

## 2023-02-27 DIAGNOSIS — J9601 Acute respiratory failure with hypoxia: Secondary | ICD-10-CM | POA: Diagnosis not present

## 2023-02-27 DIAGNOSIS — W04XXXA Fall while being carried or supported by other persons, initial encounter: Secondary | ICD-10-CM | POA: Diagnosis not present

## 2023-02-27 DIAGNOSIS — M797 Fibromyalgia: Secondary | ICD-10-CM | POA: Diagnosis not present

## 2023-02-27 DIAGNOSIS — M4625 Osteomyelitis of vertebra, thoracolumbar region: Secondary | ICD-10-CM | POA: Diagnosis not present

## 2023-02-27 DIAGNOSIS — R509 Fever, unspecified: Secondary | ICD-10-CM | POA: Diagnosis not present

## 2023-02-27 DIAGNOSIS — Z66 Do not resuscitate: Secondary | ICD-10-CM | POA: Diagnosis present

## 2023-02-27 DIAGNOSIS — R338 Other retention of urine: Secondary | ICD-10-CM | POA: Diagnosis not present

## 2023-02-27 DIAGNOSIS — N186 End stage renal disease: Secondary | ICD-10-CM | POA: Diagnosis not present

## 2023-02-27 DIAGNOSIS — J45909 Unspecified asthma, uncomplicated: Secondary | ICD-10-CM | POA: Diagnosis present

## 2023-02-27 DIAGNOSIS — G8929 Other chronic pain: Secondary | ICD-10-CM | POA: Diagnosis not present

## 2023-02-27 DIAGNOSIS — M4645 Discitis, unspecified, thoracolumbar region: Secondary | ICD-10-CM | POA: Diagnosis not present

## 2023-02-27 DIAGNOSIS — M546 Pain in thoracic spine: Secondary | ICD-10-CM | POA: Diagnosis not present

## 2023-02-27 DIAGNOSIS — M869 Osteomyelitis, unspecified: Secondary | ICD-10-CM | POA: Diagnosis present

## 2023-02-27 DIAGNOSIS — I10 Essential (primary) hypertension: Secondary | ICD-10-CM | POA: Diagnosis not present

## 2023-02-27 DIAGNOSIS — A419 Sepsis, unspecified organism: Secondary | ICD-10-CM | POA: Diagnosis not present

## 2023-02-27 DIAGNOSIS — R52 Pain, unspecified: Secondary | ICD-10-CM | POA: Diagnosis not present

## 2023-02-27 DIAGNOSIS — E1122 Type 2 diabetes mellitus with diabetic chronic kidney disease: Secondary | ICD-10-CM | POA: Diagnosis not present

## 2023-02-27 DIAGNOSIS — I132 Hypertensive heart and chronic kidney disease with heart failure and with stage 5 chronic kidney disease, or end stage renal disease: Secondary | ICD-10-CM | POA: Diagnosis present

## 2023-02-27 DIAGNOSIS — Z7189 Other specified counseling: Secondary | ICD-10-CM | POA: Diagnosis not present

## 2023-02-27 DIAGNOSIS — M4644 Discitis, unspecified, thoracic region: Secondary | ICD-10-CM | POA: Diagnosis not present

## 2023-02-27 DIAGNOSIS — G9340 Encephalopathy, unspecified: Secondary | ICD-10-CM | POA: Diagnosis not present

## 2023-02-27 DIAGNOSIS — I120 Hypertensive chronic kidney disease with stage 5 chronic kidney disease or end stage renal disease: Secondary | ICD-10-CM | POA: Diagnosis not present

## 2023-02-27 DIAGNOSIS — F32A Depression, unspecified: Secondary | ICD-10-CM | POA: Diagnosis not present

## 2023-02-27 DIAGNOSIS — E876 Hypokalemia: Secondary | ICD-10-CM | POA: Diagnosis not present

## 2023-02-27 DIAGNOSIS — E440 Moderate protein-calorie malnutrition: Secondary | ICD-10-CM | POA: Diagnosis not present

## 2023-02-27 DIAGNOSIS — N179 Acute kidney failure, unspecified: Secondary | ICD-10-CM | POA: Diagnosis not present

## 2023-02-27 DIAGNOSIS — I82503 Chronic embolism and thrombosis of unspecified deep veins of lower extremity, bilateral: Secondary | ICD-10-CM | POA: Diagnosis not present

## 2023-02-27 LAB — CBC
HCT: 29.7 % — ABNORMAL LOW (ref 36.0–46.0)
Hemoglobin: 9.1 g/dL — ABNORMAL LOW (ref 12.0–15.0)
MCH: 27.3 pg (ref 26.0–34.0)
MCHC: 30.6 g/dL (ref 30.0–36.0)
MCV: 89.2 fL (ref 80.0–100.0)
Platelets: 136 10*3/uL — ABNORMAL LOW (ref 150–400)
RBC: 3.33 MIL/uL — ABNORMAL LOW (ref 3.87–5.11)
RDW: 15.9 % — ABNORMAL HIGH (ref 11.5–15.5)
WBC: 8.4 10*3/uL (ref 4.0–10.5)
nRBC: 0 % (ref 0.0–0.2)

## 2023-02-27 LAB — GLUCOSE, CAPILLARY
Glucose-Capillary: 117 mg/dL — ABNORMAL HIGH (ref 70–99)
Glucose-Capillary: 93 mg/dL (ref 70–99)
Glucose-Capillary: 82 mg/dL (ref 70–99)
Glucose-Capillary: 84 mg/dL (ref 70–99)

## 2023-02-27 LAB — SEDIMENTATION RATE: Sed Rate: 74 mm/hr — ABNORMAL HIGH (ref 0–22)

## 2023-02-27 LAB — COMPREHENSIVE METABOLIC PANEL
ALT: 8 U/L (ref 0–44)
AST: 11 U/L — ABNORMAL LOW (ref 15–41)
Albumin: 2.8 g/dL — ABNORMAL LOW (ref 3.5–5.0)
Alkaline Phosphatase: 151 U/L — ABNORMAL HIGH (ref 38–126)
Anion gap: 9 (ref 5–15)
BUN: 50 mg/dL — ABNORMAL HIGH (ref 8–23)
CO2: 18 mmol/L — ABNORMAL LOW (ref 22–32)
Calcium: 7.9 mg/dL — ABNORMAL LOW (ref 8.9–10.3)
Chloride: 112 mmol/L — ABNORMAL HIGH (ref 98–111)
Creatinine, Ser: 4.74 mg/dL — ABNORMAL HIGH (ref 0.44–1.00)
GFR, Estimated: 10 mL/min — ABNORMAL LOW (ref >60)
Glucose, Bld: 130 mg/dL — ABNORMAL HIGH (ref 70–99)
Potassium: 4.6 mmol/L (ref 3.5–5.1)
Sodium: 139 mmol/L (ref 135–145)
Total Bilirubin: 0.4 mg/dL (ref 0.3–1.2)
Total Protein: 7.9 g/dL (ref 6.5–8.1)

## 2023-02-27 LAB — CULTURE, BLOOD (ROUTINE X 2)

## 2023-02-27 LAB — C-REACTIVE PROTEIN: CRP: 1 mg/dL — ABNORMAL HIGH (ref <1.0)

## 2023-02-27 LAB — CK: Total CK: 43 U/L (ref 38–234)

## 2023-02-27 MED ORDER — HYDROMORPHONE HCL 1 MG/ML IJ SOLN
1.0000 mg | INTRAMUSCULAR | Status: DC | PRN
  Administered 2023-02-27 – 2023-03-01 (×11): 1 mg via INTRAVENOUS
  Filled 2023-02-27 (×11): qty 1

## 2023-02-27 MED ORDER — HYDROMORPHONE HCL 1 MG/ML IJ SOLN
1.0000 mg | Freq: Once | INTRAMUSCULAR | Status: AC
  Administered 2023-02-27: 1 mg via INTRAVENOUS

## 2023-02-27 MED ORDER — SODIUM CHLORIDE 0.9% FLUSH: 9.0000 mL | INTRAVENOUS | Status: DC | PRN

## 2023-02-27 MED ORDER — CHLORHEXIDINE GLUCONATE CLOTH 2 % EX PADS
6.0000 | MEDICATED_PAD | Freq: Every day | CUTANEOUS | Status: DC
  Administered 2023-02-27 – 2023-03-08 (×11): 6 via TOPICAL

## 2023-02-27 MED ORDER — DIPHENHYDRAMINE HCL 12.5 MG/5ML PO ELIX: 12.5000 mg | ORAL_SOLUTION | Freq: Four times a day (QID) | ORAL | Status: DC | PRN

## 2023-02-27 MED ORDER — HYDROMORPHONE 1 MG/ML IV SOLN
INTRAVENOUS | Status: DC
  Administered 2023-02-27: 30 mg via INTRAVENOUS
  Filled 2023-02-27: qty 30

## 2023-02-27 MED ORDER — DIPHENHYDRAMINE HCL 50 MG/ML IJ SOLN: 12.5000 mg | Freq: Four times a day (QID) | INTRAMUSCULAR | Status: DC | PRN

## 2023-02-27 MED ORDER — NALOXONE HCL 0.4 MG/ML IJ SOLN: 0.4000 mg | INTRAMUSCULAR | Status: DC | PRN

## 2023-02-27 MED ORDER — PREGABALIN 75 MG PO CAPS
75.0000 mg | ORAL_CAPSULE | Freq: Every day | ORAL | Status: DC
  Administered 2023-02-28 – 2023-03-29 (×28): 75 mg via ORAL
  Filled 2023-02-27 (×29): qty 1

## 2023-02-27 MED ORDER — OXYCODONE HCL 5 MG PO TABS
5.0000 mg | ORAL_TABLET | ORAL | Status: DC | PRN
  Administered 2023-02-27 – 2023-03-01 (×9): 5 mg via ORAL
  Filled 2023-02-27 (×9): qty 1

## 2023-02-27 NOTE — TOC Initial Note (Addendum)
Transition of Care (TOC) - Initial/Assessment Note    Spoke with patient at bedside. Patient from home with husband . Was doing home IV ABX. HHRN with [place] and home infusion company [employer].   Patient has manual wheelchair, electric wheelchair , bedside commode and shower chair at home.   Patient also has home oxygen. She is unsure of the agency she gets her oxygen through. NCM called Barbara Cower with [employer] , they supply her home oxygen.   She does NOT have a portable tank . She reports her home oxygen order is 3 l [place] at night only, however she has been wearing it more and feels she needs 4 L [place] continuous . Explained NCM will need Oxygen saturation note and MD order to change home oxygen order. Patient voiced understanding ,  secure chatted MD.   Elita Quick with [facility] , and Kandee Keen with [facility] confirmed patient is active with them. Has HHRN and HHPT    Patient Details  Name: [name] [name] MRN: [id number] Date of Birth: 1956-09-21  Transition of Care [facility]) CM/SW Contact:    Kingsley Plan, RN Phone Number: 02/27/2023, 1:27 PM  Clinical Narrative:                   Expected Discharge Plan: Home w Home Health Services Barriers to Discharge: Continued Medical Work up   Patient Goals and CMS Choice Patient states their goals for this hospitalization and ongoing recovery are:: to return to home CMS Medicare.gov Compare Post Acute Care list provided to:: Patient Choice offered to / list presented to : Patient [place] ownership interest in [facility].provided to:: Patient    Expected Discharge Plan and Services   Discharge Planning Services: CM Consult Post Acute Care Choice: Home Health Living arrangements for the past 2 months: Apartment                 DME Arranged: N/A                    Prior Living Arrangements/Services Living arrangements for the past 2 months: Apartment Lives with:: Spouse Patient language and need for interpreter reviewed:: Yes Do  you feel safe going back to the place where you live?: Yes      Need for Family Participation in Patient Care: Yes (Comment) Care giver support system in place?: Yes (comment) Current home services: DME Criminal Activity/Legal Involvement Pertinent to Current Situation/Hospitalization: No - Comment as needed  Activities of Daily Living Home Assistive Devices/Equipment: Wheelchair ADL Screening (condition at time of admission) Patient's cognitive ability adequate to safely complete daily activities?: No Is the patient deaf or have difficulty hearing?: No Does the patient have difficulty seeing, even when wearing glasses/contacts?: No Does the patient have difficulty concentrating, remembering, or making decisions?: No Patient able to express need for assistance with ADLs?: Yes Does the patient have difficulty dressing or bathing?: Yes Independently performs ADLs?: No Communication: Independent Dressing (OT): Needs assistance Is this a change from baseline?: Pre-admission baseline Grooming: Needs assistance Is this a change from baseline?: Pre-admission baseline Feeding: Independent Bathing: Needs assistance Is this a change from baseline?: Pre-admission baseline Toileting: Needs assistance Is this a change from baseline?: Pre-admission baseline In/Out Bed: Needs assistance Is this a change from baseline?: Pre-admission baseline Walks in Home: Independent with device (comment) (electric wheelchair) Does the patient have difficulty walking or climbing stairs?: Yes Weakness of Legs: Both Weakness of Arms/Hands: None  Permission Sought/Granted   Permission granted to  share information with : Yes, Verbal Permission Granted     Permission granted to share info w AGENCY: [facility] [employer]        Emotional Assessment Appearance:: Appears stated age Attitude/Demeanor/Rapport: Engaged Affect (typically observed): Accepting Orientation: : Oriented to Self, Oriented to Place,  Oriented to  Time, Oriented to Situation Alcohol / Substance Use: Not Applicable Psych Involvement: No (comment)  Admission diagnosis:  Osteomyelitis (HCC) [M86.9] Urinary tract infection without hematuria, site unspecified [N39.0] Chronic thoracic back pain, unspecified back pain laterality [M54.6, G89.29] Nausea and vomiting, unspecified vomiting type [R11.2] Acute osteomyelitis of thoracic spine (HCC) [M46.24] Patient Active Problem List   Diagnosis Date Noted   Acute osteomyelitis of thoracic spine (HCC) 02/27/2023   Vertebral osteomyelitis (HCC) 01/28/2023   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   Osteomyelitis (HCC) 06/20/2022   Estrogen deficiency 05/25/2022   CKD (chronic kidney disease) stage 5, GFR less than 15 ml/min (HCC) 02/14/2022   Prolonged QT interval 01/07/2022   Streptococcal bacteremia    Osteomyelitis of left foot (HCC) 10/16/2021   Diabetic foot ulcers (HCC) 10/16/2021   Portal hypertension (HCC) 08/16/2021   Eczema of scalp 06/27/2021   Hypothyroidism 06/27/2021   Pruritus 06/27/2021   Seasonal allergic rhinitis due to pollen 02/02/2021   PTSD (post-traumatic stress disorder)    Osteoarthritis    MRSA (methicillin resistant Staphylococcus aureus)    Hypertension    High cholesterol    Hepatic steatosis    GERD (gastroesophageal reflux disease)    Fibromyalgia    Episode of visual loss of left eye    Depression    Constipation    Cataract    Bulging lumbar disc    Asthma    Anxiety    Anemia    Tinea capitis 10/11/2020   Tinea corporis 10/11/2020   Hip pain 05/16/2020   IDA (iron deficiency anemia) 05/04/2020   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) 03/16/2020   S/P lumbar fusion 03/04/2020   Spinal stenosis of lumbar region 01/22/2020   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) 01/21/2020   Compression fracture of L1 lumbar vertebra (HCC) 11/17/2019   Fracture of multiple ribs 11/17/2019   Nausea &  vomiting 11/15/2019   Diarrhea 09/04/2019   Pressure injury of skin 04/17/2019   Septic arthritis of elbow, right (HCC) 04/17/2019   Retinopathy 04/17/2019   Renal transplant, 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   Sleep apnea 11/16/2017   Diabetic foot infection (HCC) 04/06/2017   Type 2 diabetes mellitus with hyperglycemia, with long-term current use of insulin (HCC) 04/06/2017   Neuropathy 05/05/2015   Diabetic peripheral neuropathy (HCC) 01/12/2015   CKD (chronic kidney disease), stage III (HCC) 05/27/2014   History of MI (myocardial infarction) 10/06/2013   Chest pain 10/06/2013   Nausea and vomiting 10/06/2013   Obesity (BMI 30-39.9) 09/20/2013   Multinodular goiter 04/17/2013   Normocytic anemia 02/03/2013   CAD (coronary artery disease) 01/11/2012   UTI (urinary tract infection) 08/05/2010   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, [place] - [address] [place] [address] Phone: [phone] Fax: [phone]  [facility] [address] [place] [address] Phone: [phone] Fax: [phone]     Social Determinants of Health (SDOH) Social  History: SDOH Screenings   Food Insecurity: No Food Insecurity (02/26/2023)  Housing: Low Risk  (02/26/2023)  Transportation Needs: No Transportation Needs (02/26/2023)  Utilities: Not At Risk (02/26/2023)  Alcohol Screen: Low Risk  (12/30/2021)  Depression (PHQ2-9): Low Risk  (08/23/2022)  Financial Resource Strain: Low Risk  (12/30/2021)  Physical Activity: Inactive (12/30/2021)  Social Connections: Socially Integrated (12/30/2021)  Stress: Stress Concern Present (12/30/2021)  Tobacco Use: Low Risk  (02/16/2023)   SDOH Interventions:     Readmission Risk Interventions    06/23/2022     2:13 PM  Readmission Risk Prevention Plan  Transportation Screening Complete  PCP or Specialist Appt within 3-5 Days Complete  HRI or Home Care Consult Complete  Social Work Consult for Recovery Care Planning/Counseling Complete  Palliative Care Screening Not Applicable  Medication Review Oceanographer) Complete

## 2023-02-27 NOTE — Progress Notes (Signed)
Pharmacy Antibiotic Note  Ozzie Sandar Krinke is a 67 y.o. adult admitted on 02/26/2023 with  osteomyelitis .  Pharmacy has been consulted for daptomycin dosing.  Patient was recently discharged 4/7 after being hospitalized with T4/5 osteomyelitis. She was discharged with IV daptomycin x 8 weeks, per ID, with an end date set for 03/30/23. Due to the patient's poor renal function, she has been receiving IV daptomycin 8mg /kg IV q48hrs while outpatient. Pharmacy will continue this dose inpatient. Patient reported last dose of IV daptomycin was 4/28 ~1600-1700.   CK 43  WBC 8.4, afebrile  Plan: - Continue IV daptomycin 8mg /kg q48hrs  - Check CK weekly while on daptomycin  - Monitor renal function and overall clinical picture   Height: 4\' 11"  (149.9 cm) Weight: 79 kg (174 lb 2.6 oz) IBW/kg (Calculated) : 43.2  Temp (24hrs), Avg:97.7 F (36.5 C), Min:97.4 F (36.3 C), Max:97.9 F (36.6 C)  Recent Labs  Lab 02/26/23 0825 02/27/23 0747  WBC 7.5 8.4  CREATININE 4.61* 4.74*  LATICACIDVEN 1.1  --      Estimated Creatinine Clearance (by C-G formula based on SCr of 4.74 mg/dL (H)) Female: 16.1 mL/min (A) Female: 12.9 mL/min (A)    Allergies  Allergen Reactions   Bee Pollen Anaphylaxis   Broccoli [Brassica Oleracea] Anaphylaxis and Hives   Fish-Derived Products Anaphylaxis and Hives   Mushroom Extract Complex Anaphylaxis   Penicillins Anaphylaxis    **Tolerated cefepime March 2021 Did it involve swelling of the face/tongue/throat, SOB, or low BP? Yes Did it involve sudden or severe rash/hives, skin peeling, or any reaction on the inside of your mouth or nose? No Did you need to seek medical attention at a hospital or doctor's office? Yes When did it last happen?  A few months ago If all above answers are "NO", may proceed with cephalosporin use.   Rosemary Oil Anaphylaxis   Shellfish Allergy Anaphylaxis and Hives   Tomato Anaphylaxis and Hives    Raw tomatoes only, can tolerate if  cooked   Acetaminophen Other (See Comments)    GI upset   Aloe Vera Hives   Doxycycline Diarrhea   Acyclovir And Related Other (See Comments)    Unknown reaction   Naproxen Hives    Antimicrobials this admission: 4/29 ceftriaxone 2g x1  4/5 daptomycin >> (5/31)  Dose adjustments this admission: N/A  Microbiology results: 4/29 BCx: no growth to date <24h   Thank you for allowing pharmacy to be a part of this patient's care.  Wilburn Cornelia, PharmD, BCPS Clinical Pharmacist 02/27/2023 1:22 PM   Please refer to [facility] for pharmacy phone number

## 2023-02-27 NOTE — Progress Notes (Signed)
   02/27/23 1556  Provider Notification  Provider Name/Title Rito Ehrlich, MD  Date Provider Notified 02/27/23  Time Provider Notified 1551  Method of Notification Page (secure chat)  Notification Reason Other (Comment) (VS per order, RR 8-12 on PCA)  Provider response See new orders  Date of Provider Response 02/27/23  Time of Provider Response 1551

## 2023-02-27 NOTE — Consult Note (Signed)
[facility]    Date of Admission:  02/26/2023     Reason for Consult: Discitis/OM     Referring Physician: Dr Alinda Money  Current antibiotics: Daptomycin    ASSESSMENT:    67 y.o. adult admitted with:  Discitis/OM: She has extensive history of infection in her back with known thoracolumbar discitis/OM and instrumentation.  Previously she was treated for Pseudomonas but more recently cultures have yielded MRSE and she is currently on a prolonged course of Daptomycin due to this through 03/30/23.  She is admitted now with concern for worsening infection at the T5 level. Repeat inflammatory markers are pending. CKD stage 5: Her creatinine at admission was stable at 4.61, CrCl ~11. Cirrhosis: Known history.  Labs stable. Obesity: Body mass index is 36.56 kg/m.   RECOMMENDATIONS:    Continue Daptomycin per pharmacy Thoracic MRI to better characterize CT findings.  Difficult to discern if this is worsening infection on antibiotics vs expected radiographic changes and lag associated with discitis/OM If MRI shows worsening then plan for repeat aspirate ESR/CRP Lab monitoring Following   Active Problems:   Hyperlipidemia   Essential hypertension   Hepatic cirrhosis (HCC)   CAD (coronary artery disease)   Normocytic anemia   Obesity (BMI 30-39.9)   CKD (chronic kidney disease), stage III (HCC)   Diabetic peripheral neuropathy (HCC)   Type 2 diabetes mellitus with hyperglycemia, with long-term current use of insulin (HCC)   Sleep apnea   Renal transplant, status post   Nausea & vomiting   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)   S/P lumbar fusion   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)   IDA (iron deficiency anemia)   PTSD (post-traumatic stress disorder)   Hypertension   High cholesterol   GERD (gastroesophageal reflux disease)   Fibromyalgia   Depression   Asthma   Anxiety   Anemia    Hypothyroidism   CKD (chronic kidney disease) stage 5, GFR less than 15 ml/min (HCC)   Osteomyelitis (HCC)   MEDICATIONS:    Scheduled Meds:  apixaban  2.5 mg Oral BID   calcium carbonate  1 tablet Oral Daily   carvedilol  6.25 mg Oral BID WC   DULoxetine  60 mg Oral Daily   furosemide  20 mg Oral BID   insulin aspart  0-15 Units Subcutaneous TID WC   lidocaine  2 patch Transdermal Q24H   mometasone-formoterol  2 puff Inhalation BID   pregabalin  75 mg Oral BID   sodium chloride flush  3 mL Intravenous Q12H   Continuous Infusions:  DAPTOmycin (CUBICIN) 650 mg in sodium chloride 0.9 % IVPB     PRN Meds:.albuterol, diazepam, HYDROcodone-acetaminophen, HYDROmorphone (DILAUDID) injection, ondansetron (ZOFRAN) IV, polyethylene glycol  HPI:    [name] [name] is a 67 y.o. adult with PMHx as below admitted 4/29 with back pain and nausea.  She was recently admitted to the hospital 3/31-4/7 with progressive T4-5 discitis/OM in setting of prior thoracolumbar discitis/OM with hardware placement.  She had IR guided aspiration at that time of recent admission which grew MRSE.  She was seen by Dr Thedore Mins during that admission who recommended Daptomycin via PICC line through 5/31 with plan for suppressive doxycycline thereafter.  In the ED upon admission, she had CT of the Chest/Abdomen/Pelvis notable for known prior T4-5 discitis/OM and continued paraspinal and soft tissue density.  At the T5 level there were changes concerning for acute OM.  She  was subsequently admitted and ID consulted.    She has long history regarding infection in her back with thoracolumbar osteomyelitis previously due to Pseudomonas (12/2019) status post treatment. She has required lumbar fusion hardware and had been continued on antibiotics for prolonged periods of time, however, this was subsequently stopped at an unknown time point. She was then back in the hospital in August 2023 with infection. She underwent repeat  fusion at that time and cultures grew MRSE.  She was discharged on daptomycin and completed therapy in October 2023 prior to switching to oral doxycycline suppression.  However, she did not tolerate this due to diarrhea, nausea, and vomiting after 1 day.  She saw Dr Orvan Falconer on 08/23/22 and she preferred to stay off antibiotics at that time.  As noted above, she then presented to [facility] in April 2024 with worsening infection and OM at T4-5.  Aspiration at that time grew MRSE.  She was discharged on daptomycin by Dr. Thedore Mins.  She had follow-up with her on 02/19/2023 with no acute concerns.  In that interval from then until her admission yesterday, she reports acutely worsening back pain with nausea and subjective fevers.  She reports overnight since being admitted that her nausea has slightly improved.  She also reports that her back pain has slightly improved.        Past Medical History:  Diagnosis Date   Acute MI [facility]) 2007   presented to ED & had cardiac cath- but found to have normal coronaries. Since that point in time her PCP cares f or cardiac needs. Dr. Ovid Curd - [facility]   Anemia    Anginal pain [facility])    Anxiety    Asthma    Back pain 11/17/2019   Bulging lumbar disc    CAD (coronary artery disease) 01/11/2012   Cataract    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) 03/16/2020   Chronic kidney disease    "had transplant when I was 15; doesn't bother me now" (03/20/2013)   Cirrhosis of liver without mention of alcohol    Constipation    Dehiscence of closure of skin    left partial calcaneal excision   Depression    Diabetes mellitus    insulin dependent, adult onset   Diarrhea 09/04/2019   Episode of visual loss of left eye    Essential hypertension 12/23/2006   Qualifier: Diagnosis of  By: Drue Novel MD, Nolon Rod.    Exertional shortness of breath    Fatty liver    Fibromyalgia    GERD (gastroesophageal reflux disease)    Hepatic steatosis    High  cholesterol    History of MI (myocardial infarction) 10/06/2013   Hyperlipidemia 06/03/2008   Qualifier: Diagnosis of  By: Drue Novel MD, Nolon Rod.    Hypertension    MRSA (methicillin resistant Staphylococcus aureus)    Neuropathy    lower legs   Osteoarthritis    hands, hips   Proximal humerus fracture 10/15/2012   Left   PTSD (post-traumatic stress disorder)    Retinopathy 04/17/2019   Sleep apnea 11/16/2017   SOB (shortness of breath) 10/11/2020   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   Qualifier: Diagnosis of  By: Duaine Dredge CMA, Felecia     Type 2 diabetes mellitus with hyperglycemia, with long-term current use of insulin (HCC) 04/06/2017    Social History   Tobacco Use   Smoking status: Never    Passive exposure: Never   Smokeless tobacco: Never  Vaping Use  Vaping Use: Never used  Substance Use Topics   Alcohol use: No    Alcohol/week: 0.0 standard drinks of alcohol   Drug use: No    Family History  Problem Relation Age of Onset   Heart disease Father    Diabetes Father    Colitis Father    Crohn's disease Father    Cancer Father        leukemia   Leukemia Father    Diabetes Mellitus II Brother    Kidney disease Brother    Heart disease Brother    Diabetes Mother    Hypertension Mother    Mental illness Mother    Irritable bowel syndrome Daughter    Diabetes Mellitus II Brother    Kidney disease Brother    Liver disease Brother    Kidney disease Brother    Heart attack Brother    Diabetes Mellitus II Brother    Heart disease Brother    Liver disease Brother    Kidney disease Brother    Kidney disease Brother    Diabetes Mellitus II Brother    Diabetes Mellitus I Brother     Allergies  Allergen Reactions   Bee Pollen Anaphylaxis   Broccoli [Brassica Oleracea] Anaphylaxis and Hives   Fish-Derived Products Anaphylaxis and Hives   Mushroom Extract Complex Anaphylaxis   Penicillins Anaphylaxis    **Tolerated cefepime March 2021 Did it involve swelling of the  face/tongue/throat, SOB, or low BP? Yes Did it involve sudden or severe rash/hives, skin peeling, or any reaction on the inside of your mouth or nose? No Did you need to seek medical attention at a hospital or doctor's office? Yes When did it last happen?  A few months ago If all above answers are "NO", may proceed with cephalosporin use.   Rosemary Oil Anaphylaxis   Shellfish Allergy Anaphylaxis and Hives   Tomato Anaphylaxis and Hives    Raw tomatoes only, can tolerate if cooked   Acetaminophen Other (See Comments)    GI upset   Aloe Vera Hives   Doxycycline Diarrhea   Acyclovir And Related Other (See Comments)    Unknown reaction   Naproxen Hives    Review of Systems  All other systems reviewed and are negative.  Except as other as noted above in the HPI. OBJECTIVE:   Blood pressure (!) 184/96, pulse 96, temperature 97.7 F (36.5 C), resp. rate 17, height 4\' 11"  (1.499 m), weight 82.1 kg, SpO2 100 %. Body mass index is 36.56 kg/m.  Physical Exam Constitutional:      Comments: She is lying in bed on 6 N.  She is in no acute distress but does seem slightly uncomfortable  HENT:     Head: Normocephalic and atraumatic.  Eyes:     Extraocular Movements: Extraocular movements intact.     Conjunctiva/sclera: Conjunctivae normal.  Cardiovascular:     Comments: Right chest wall central line in place.  No erythema, tenderness Pulmonary:     Effort: Pulmonary effort is normal. No respiratory distress.     Comments: She is currently on nasal cannula Abdominal:     Palpations: Abdomen is soft.     Tenderness: There is no abdominal tenderness. There is no guarding or rebound.  Musculoskeletal:     Cervical back: Normal range of motion and neck supple.     Comments: She has left scapular area tenderness with muscle tightness  Skin:    General: Skin is warm and dry.  Findings: No rash.  Neurological:     General: No focal deficit present.     Mental Status: She is oriented  to person, place, and time.  Psychiatric:        Mood and Affect: Mood normal.        Behavior: Behavior normal.      Lab Results: Lab Results  Component Value Date   WBC 7.5 02/26/2023   HGB 9.2 (L) 02/26/2023   HCT 30.3 (L) 02/26/2023   MCV 91.0 02/26/2023   PLT 142 (L) 02/26/2023    Lab Results  Component Value Date   NA 139 02/26/2023   K 4.3 02/26/2023   CO2 16 (L) 02/26/2023   GLUCOSE 209 (H) 02/26/2023   BUN 53 (H) 02/26/2023   CREATININE 4.61 (H) 02/26/2023   CALCIUM 8.2 (L) 02/26/2023   GFRNONAA 10 (L) 02/26/2023   GFRAA 31 (L) 06/16/2020    Lab Results  Component Value Date   ALT 9 02/26/2023   AST 12 (L) 02/26/2023   ALKPHOS 157 (H) 02/26/2023   BILITOT 0.6 02/26/2023       Component Value Date/Time   CRP 7.4 (H) 01/30/2023 0908       Component Value Date/Time   ESRSEDRATE 97 (H) 01/30/2023 0908    I have reviewed the micro and lab results in Epic.  Imaging: CT CHEST ABDOMEN PELVIS WO CONTRAST  Result Date: 02/26/2023 CLINICAL DATA:  Upper back pain. EXAM: CT CHEST, ABDOMEN AND PELVIS WITHOUT CONTRAST TECHNIQUE: Multidetector CT imaging of the chest, abdomen and pelvis was performed following the standard protocol without IV contrast. RADIATION DOSE REDUCTION: This exam was performed according to the departmental dose-optimization program which includes automated exposure control, adjustment of the mA and/or kV according to patient size and/or use of iterative reconstruction technique. COMPARISON:  June 20, 2022.  January 28, 2023. FINDINGS: CT CHEST FINDINGS Cardiovascular: Atherosclerosis of thoracic aorta is noted without aneurysm or dissection. Normal cardiac size. No pericardial effusion. Coronary artery calcifications are noted. Mediastinum/Nodes: No enlarged mediastinal, hilar, or axillary lymph nodes. Thyroid gland, trachea, and esophagus demonstrate no significant findings. Lungs/Pleura: No pneumothorax or pleural effusion is noted. Minimal  bibasilar subsegmental atelectasis is noted. Musculoskeletal: Erosive changes are again seen involving the T4-5 disc space as noted on prior CT scan most likely representing sequela of prior osteomyelitis and discitis at this level. There is also noted continued abnormal soft tissue density in the paraspinal region at this level. There does appear to be increased resorption of the T5 vertebral body and acute osteomyelitis cannot be excluded. Status post surgical posterior fusion extending from T8 to L3. CT ABDOMEN PELVIS FINDINGS Hepatobiliary: Status post cholecystectomy. Consistent with hepatic cirrhosis. No biliary dilatation is noted. Pancreas: Unremarkable. No pancreatic ductal dilatation or surrounding inflammatory changes. Spleen: Normal in size without focal abnormality. Adrenals/Urinary Tract: Adrenal glands are unremarkable. Kidneys are normal, without renal calculi, focal lesion, or hydronephrosis. Bladder is unremarkable. Stomach/Bowel: Stomach is within normal limits. Appendix appears normal. No evidence of bowel wall thickening, distention, or inflammatory changes. Vascular/Lymphatic: Aortic atherosclerosis. No enlarged abdominal or pelvic lymph nodes. Reproductive: Status post hysterectomy. No adnexal masses. Other: No abdominal wall hernia or abnormality. No abdominopelvic ascites. Musculoskeletal: Status post surgical fusion extending from thoracic spine to L3 level, with probable corpectomy present at L1. No acute osseous abnormality is noted. IMPRESSION: Erosive changes are again seen involving the T4-5 disc space as noted on prior CT scan most likely sequela of prior osteomyelitis and discitis. Continued  abnormal soft tissue density is noted in paraspinal region as well. There does appear to be increased resorption of T5 vertebral body and acute osteomyelitis cannot be excluded. MRI may be performed for further evaluation. Hepatic cirrhosis.  Status post cholecystectomy. Extensive postsurgical  changes are seen involving the thoracic and lumbar spine as described above, with probable corpectomy present at L1. Coronary artery calcifications are noted. Aortic Atherosclerosis (ICD10-I70.0). Electronically Signed   By: Lupita Raider M.D.   On: 02/26/2023 13:36     Imaging independently reviewed in Epic.  Vedia Coffer for Infectious Disease [facility] [phone] pager 02/27/2023, 5:17 AM  I have personally spent 80 minutes involved in face-to-face and non-face-to-face activities for this patient on the day of the visit. Professional time spent includes the following activities: Preparing to see the patient (review of tests), Obtaining and/or reviewing separately obtained history (admission/discharge record), Performing a medically appropriate examination and/or evaluation , Ordering medications/tests/procedures, referring and communicating with other health care professionals, Documenting clinical information in the EMR, Independently interpreting results (not separately reported), Communicating results to the patient/family/caregiver, Counseling and educating the patient/family/caregiver and Care coordination (not separately reported).

## 2023-02-27 NOTE — Plan of Care (Signed)

## 2023-02-27 NOTE — Progress Notes (Signed)
[facility] Progress Note  Patient: [name] [name]    ZOX:[id number]  DOA: 02/26/2023    Date of Service: the patient was seen and examined on 02/27/2023  Brief hospital course: Patient is a 67 year old female with past medical history of diabetes mellitus, secondary neuropathy, obstructive sleep apnea, stage V CKD, hypertension, morbid obesity, cirrhosis and history of renal transplant with history of L1 osteomyelitis status post L1 corpectomy along with history of T10-T11 discitis/osteomyelitis status post posterior arthrodesis of T8-T11 and removal of T11 pedicle screws and rod removal in August 2023 with recent hospitalization a month ago for worsening back pain.  At that time, patient was diagnosed with T4-5 discitis/osteomyelitis.  Neurosurgery consulted at that time and recommended no additional surgical intervention.  Patient underwent disc aspiration followed by tunneled catheter placement and discharged on IV daptomycin for 8 weeks, followed by suppressive oral antibiotics lifelong.  She returned back to the emergency room on 4/29 morning with complaints of 1 week of worsening back pain along with nausea and vomiting x 1.  CT in the emergency room unable to exclude acute osteomyelitis at T5 level.  Patient seen by infectious disease and thoracic MRI ordered, with results noting mild interval collapse in the T4 vertebral body since MRI done 3/31 (day of admission previous hospitalization), as well as mild new marrow edema at T3 likely representing osteomyelitis.  In addition, noted to have mild interval increase in size of dorsal epidural fluid collection measuring 7 mm, possibly abscess or phlegmon.   Assessment and Plan: Acute osteomyelitis at T3 with vertebral collapse of T4 causing acute on chronic back pain: Will discuss with neurosurgery, although based on previous consultation, they advised likely no surgical intervention even if further collapse occurs.  Continue IV daptomycin.   Interventional radiology consulted for fluid aspiration.  Infectious disease following.  Follow CRP and sed rate.  Significant pain, unrelieved for length of time on IV Dilaudid.  Will change over to Dilaudid PCA  Nausea and vomiting: Likely secondary to pain.  Symptomatic care  Stage V CKD: At baseline  Obstructive sleep apnea: Patient noncompliant with CPAP.  History of DVT: Continue home Eliquis.  History of anxiety/depression/PTSD: Continue Valium and duloxetine  History of chronic pain: Continue home Norco, Lyrica  Cirrhosis of liver: Stable.  Patient with subsequent coagulopathy and INR 1.3.  Monitor for bleeding.  Morbid obesity: Meets criteria with BMI greater than 35+ comorbidities of osteomyelitis, diabetes mellitus, hypertension  Anemia of chronic disease: Hemoglobin currently at 9.2, which is an improvement from previous hospitalization.  Diabetes mellitus: Looks to be diet controlled.  Sliding scale only.  Last A1c over a year ago, will recheck  Essential hypertension: Continue home medications.  Elevated, especially in the setting of acute pain.  As needed hydralazine.  Peripheral neuropathy: Continue Lyrica.  Body mass index is 35.18 kg/m.    Pressure Injury 02/26/23 Heel Left (Active)  02/26/23 1826  Location: Heel  Location Orientation: Left  Staging:   Wound Description (Comments):   Present on Admission: Yes  Dressing Type Gauze (Comment);Other (Comment) 02/27/23 0825     Consultants: Infectious disease Interventional radiology Neurosurgery  Procedures: Will discuss with neurosurgery versus intervention radiology about fluid collection aspirate  Antimicrobials: IV Rocephin x 1 4/29 Continued on IV daptomycin from outpatient (continued until 5/31 for 8 weeks of therapy)  Code Status: Full code   Subjective: Patient complaining of severe back pain in the mid upper back  Objective: Vital signs were reviewed and unremarkable. Vitals:  02/27/23 0752 02/27/23 0828  BP: (!) 157/78   Pulse: 88 88  Resp: 18 18  Temp: 97.9 F (36.6 C)   SpO2: 100% 100%    Intake/Output Summary (Last 24 hours) at 02/27/2023 1235 Last data filed at 02/27/2023 1000 Gross per 24 hour  Intake 600 ml  Output --  Net 600 ml   Filed Weights   02/26/23 0648 02/27/23 0500  Weight: 82.1 kg 79 kg   Body mass index is 35.18 kg/m.  Exam:  General: Alert and oriented x 3, moderate distress secondary to pain HEENT: Normocephalic, atraumatic, mucous membranes are moist Cardiovascular: Regular rate and rhythm, S1-S2 Respiratory: To auscultation bilaterally Abdomen: Soft, nontender, nondistended, positive bowel sounds Musculoskeletal: No clubbing or cyanosis or edema.  Left leg in boot Psychiatry: Appropriate, no evidence of psychoses Neurology: Patient legally blind.  Data Reviewed: Creatinine of 4.74, albumin of 2.8, hemoglobin 9.1, sed rate of 74  Disposition:  Status is: Inpatient  Anticipated discharge date: 5/6  Remaining issues to be resolved so that patient can be discharged:  -Aspirated fluid collection -Discussed with neurosurgery -Adjustment of antibiotics or other recommendations by infectious disease   Family Communication: Will call husband DVT Prophylaxis: apixaban (ELIQUIS) tablet 2.5 mg Start: 02/26/23 2200 apixaban (ELIQUIS) tablet 2.5 mg    Author: Hollice Espy ,MD 02/27/2023 12:35 PM  To reach On-call, see care teams to locate the attending and reach out via [email or web address]. Between 7PM-7AM, please contact night-coverage If you still have difficulty reaching the attending provider, please page the [facility] (Director on Call) for [facility] on amion for assistance.

## 2023-02-28 ENCOUNTER — Encounter: Admit: 2023-02-28 | Discharge: 2023-02-28 | Payer: MEDICARE

## 2023-02-28 DIAGNOSIS — M4644 Discitis, unspecified, thoracic region: Secondary | ICD-10-CM

## 2023-02-28 DIAGNOSIS — E669 Obesity, unspecified: Secondary | ICD-10-CM | POA: Diagnosis not present

## 2023-02-28 DIAGNOSIS — N185 Chronic kidney disease, stage 5: Secondary | ICD-10-CM | POA: Diagnosis not present

## 2023-02-28 DIAGNOSIS — M4624 Osteomyelitis of vertebra, thoracic region: Secondary | ICD-10-CM | POA: Diagnosis not present

## 2023-02-28 DIAGNOSIS — K7030 Alcoholic cirrhosis of liver without ascites: Secondary | ICD-10-CM | POA: Diagnosis not present

## 2023-02-28 LAB — GLUCOSE, CAPILLARY
Glucose-Capillary: 75 mg/dL (ref 70–99)
Glucose-Capillary: 76 mg/dL (ref 70–99)
Glucose-Capillary: 85 mg/dL (ref 70–99)
Glucose-Capillary: 84 mg/dL (ref 70–99)

## 2023-02-28 LAB — CULTURE, BLOOD (ROUTINE X 2)

## 2023-02-28 MED ORDER — IRBESARTAN 300 MG PO TAB
300 mg | ORAL_TABLET | Freq: Every evening | ORAL | 0 refills | Status: AC
Start: 2023-02-28 — End: ?

## 2023-02-28 MED ORDER — DICLOFENAC SODIUM 1 % EX GEL
4.0000 g | Freq: Four times a day (QID) | CUTANEOUS | Status: DC
  Administered 2023-02-28 – 2023-03-12 (×38): 4 g via TOPICAL
  Filled 2023-02-28 (×2): qty 100

## 2023-02-28 MED ORDER — ACETAMINOPHEN 500 MG PO TABS
1000.0000 mg | ORAL_TABLET | Freq: Three times a day (TID) | ORAL | Status: DC
  Administered 2023-02-28 – 2023-04-03 (×89): 1000 mg via ORAL
  Filled 2023-02-28 (×93): qty 2

## 2023-02-28 MED ORDER — SODIUM BICARBONATE 650 MG PO TABS: 650.0000 mg | ORAL_TABLET | Freq: Two times a day (BID) | ORAL | Status: DC

## 2023-02-28 MED ORDER — ONDANSETRON HCL 4 MG/2ML IJ SOLN
4.0000 mg | Freq: Four times a day (QID) | INTRAMUSCULAR | Status: DC | PRN
  Administered 2023-02-28 – 2023-04-04 (×12): 4 mg via INTRAVENOUS
  Filled 2023-02-28 (×14): qty 2

## 2023-02-28 MED ORDER — SODIUM BICARBONATE 650 MG PO TABS
650.0000 mg | ORAL_TABLET | Freq: Every day | ORAL | Status: DC
  Administered 2023-02-28 – 2023-03-01 (×2): 650 mg via ORAL
  Filled 2023-02-28 (×2): qty 1

## 2023-02-28 MED ORDER — SODIUM CHLORIDE 0.9 % IV BOLUS
250.0000 mL | Freq: Once | INTRAVENOUS | Status: AC
  Administered 2023-02-28: 250 mL via INTRAVENOUS

## 2023-02-28 NOTE — Progress Notes (Addendum)
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 Last month, diagnosed with T4-T5 discitis osteomyelitis which was being treated with daptomycin.  Neurosurgery did not recommend any further interventions.  The patient returns to the hospital for worsening back pain for [redacted] week along with an episode of vomiting. CT chest abdomen pelvis: Prior T4-T5 osteomyelitis discitis, difficult to rule out osteomyelitis and T5, continued paraspinal soft tissue density. 4/30: ID consult  Subjective:  Nauseated.  Complains of pain mostly in the back of her shoulders, shoulder blades, underneath her arms and in the rib cage.  She has mid back pain as well.  She states she has not urinated since yesterday around 4 PM and is very concerned about this. She does not feel the urge to urinate.   Assessment and Plan: Principal Problem:   Acute osteomyelitis of thoracic spine - recently grew MRSE -MRI obtained and reveals new T3 marrow edema, persistent T4 and T5 infection, epidural fluid collection increased from prior imaging of about 7 mm-abscess versus phlegmon, paraspinal phlegmon at T4-T5 -CRP 1.0, ESR 74, platelets dropping to 136 from 150s 160s - IR has been consulted-the patient will have to be off of Eliquis for 2 days and procedure can be planned for 5/3 - ID recommends to continue daptomycin - PRN pain meds   Active Problems: Oliguria CKD stage V-GFR less than 15 - No urine output in about 16 hours according to the patient - bladder scan reveals about 180 cc in the bladder - Hold Lasix - 240 cc  normal saline bolus ordered - Follow fluid balance closely - dose not take Sodium Bicarb as outpt and is not aware that she is suppose to be- will start 1 tab daily today which can be increased if her nausea is not severe   HTN, h/o mod LVH grade 1 diastolic dysfunction - cont Coreg- hold Lasix    Hepatic cirrhosis, coagulopathy, mild splenomegaly - INR 1.2- 1.3- will gvie a dose of Vit K  Post arm pain and upper back pain - likely musculoskeletal sprain/ spasm- seems that this pain is worse than her mild back pain -  voltaren gel and k pad for now  DM? - last A1c 4.4- not on insulin or other meds - cont SSI  Wound LLE - follows with wound care as outpt-  - wound care consulted in house   H/o DVT - on Eliquis  ? Constipation - occasional- dose not take Amitiza as listed on med rec and is not aware of it  Anxiety, pain - Cymbalta, PRN Valium  Morbid Obesity Body mass index is 35.13 kg/m.  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 We have turned off O2 today and advised to keep it off unless she becomes hypoxic- ? If used for OSA in the past     Code Status: Full Code Consultants: ID, IR Level of Care: Level of care: Telemetry Medical Total time on patient  care: 35 min DVT prophylaxis:  Xarelto     Objective:   Vitals:   02/28/23 0751 02/28/23 0809 02/28/23 1032 02/28/23 1037  BP: (!) 142/75     Pulse: 77  77 78  Resp: 18     Temp: 98.5 F (36.9 C)     TempSrc: Oral     SpO2: 100% 99% 100% 100%  Weight:      Height:       Filed Weights   02/26/23 0648 02/27/23 0500 02/28/23 0500  Weight: 82.1 kg 79 kg 78.9 kg   Exam: General exam: Appears comfortable  HEENT: oral mucosa dry Respiratory system: Clear to auscultation.  Cardiovascular system: S1 & S2 heard  Gastrointestinal system: Abdomen soft, non-tender, nondistended. Normal  bowel sounds   Extremities: No cyanosis, clubbing or edema- prevelon boot LLE Psychiatry:  Mood & affect appropriate.  MSK: very tender in trapezius, rhomboids and ribs bilaterally     CBC: Recent Labs  Lab 02/26/23 0825 02/27/23 0747  WBC 7.5 8.4  NEUTROABS 6.6  --   HGB 9.2* 9.1*  HCT 30.3* 29.7*  MCV 91.0 89.2  PLT 142* 136*   Basic Metabolic Panel: Recent Labs  Lab 02/26/23 0825 02/27/23 0747  NA 139 139  K 4.3 4.6  CL 109 112*  CO2 16* 18*  GLUCOSE 209* 130*  BUN 53* 50*  CREATININE 4.61* 4.74*  CALCIUM 8.2* 7.9*   GFR: Estimated Creatinine Clearance (by C-G formula based on SCr of 4.74 mg/dL (H)) Female: 16.1 mL/min (A) Female: 12.9 mL/min (A)  Scheduled Meds:  acetaminophen  1,000 mg Oral TID   apixaban  2.5 mg Oral BID   calcium carbonate  1 tablet Oral Daily   carvedilol  6.25 mg Oral BID WC   Chlorhexidine Gluconate Cloth  6 each Topical Daily   diclofenac Sodium  4 g Topical QID   DULoxetine  60 mg Oral Daily   insulin aspart  0-15 Units Subcutaneous TID WC   lidocaine  2 patch Transdermal Q24H   mometasone-formoterol  2 puff Inhalation BID   pregabalin  75 mg Oral Daily   sodium bicarbonate  650 mg Oral Daily   sodium chloride flush  3 mL Intravenous Q12H   Continuous Infusions:  DAPTOmycin (CUBICIN) 650 mg in sodium chloride 0.9 % IVPB 650 mg (02/27/23 1628)   Imaging and lab data was personally reviewed MR THORACIC SPINE WO CONTRAST  Result Date: 02/27/2023 CLINICAL DATA:  Mid-back pain, infection suspected, no prior imaging EXAM: MRI THORACIC SPINE WITHOUT CONTRAST TECHNIQUE: Multiplanar, multisequence MR imaging of the thoracic spine was performed. No intravenous contrast was administered. COMPARISON:  MRI January 28, 2023. FINDINGS: Alignment: No substantial sagittal subluxation. Mildly exaggerated thoracic kyphosis. Vertebrae: Continue changes of discitis/osteomyelitis at T4-T5, including marrow edema and disc edema. Mild marrow edema at T3 is  new and likely represents osteomyelitis. In comparison to January 28, 2023, mild interval collapse in the T4 vertebral body. For example, T4 vertebral body height is now 8 mm (previously 12 mm when remeasured). T4 vertebral body height is similar with continued erosive changes involving the endplates. Cord:  Normal cord signal. Paraspinal and other soft tissues: Paraspinal edema at T4-5, likely phlegmon. Disc levels: Mild interval increase in size of a dorsal epidural fluid collection measuring 7 mm, likely abscess or phlegmon. This along with endplate spurring at T4-T5 results in progressive moderate canal stenosis. Similar superimposed multilevel degenerative change. IMPRESSION: 1. Persistent T4-T5 discitis/osteomyelitis. In comparison to January 28, 2023,  mild interval collapse in the T4 vertebral body. Mild new marrow edema at T3 is new and likely represents osteomyelitis. 2. Mild interval increase in size of a suspected dorsal epidural fluid collection measuring 7 mm, possibly abscess or phlegmon. Postcontrast imaging could help confirm and further characterize if clinically warranted. This along with endplate spurring at T4-T5 results in moderate canal stenosis. 3. Paraspinal phlegmon at T4-T5. Electronically Signed   By: Feliberto Harts M.D.   On: 02/27/2023 11:23    LOS: 1 day   Author: Calvert Cantor  02/28/2023 1:21 PM  To contact [facility]>   Check the care team in [facility] and look for the attending/consulting [facility] provider listed  Log into [email or web address] and use 's universal password   Go to> "[facility]"  and find provider  If you still have difficulty reaching the provider, please page the [facility] (Director on Call) for the Hospitalists listed on amion

## 2023-02-28 NOTE — Consult Note (Signed)
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 Using ultrasound guidance, the skin and subcutaneous tissues overlying the right internal jugular vein were generously infiltrated with 1% lidocaine without epinephrine. Using ultrasound guidance, the right internal jugular vein was punctured with a micropuncture needle, and an 018 wire was advanced into the right heart confirming  venous access. A small stab incision was made with an 11 blade scalpel. Peel-away sheath was placed over the wire, and then the wire was removed, marking the wire for estimation of internal catheter length. The chest wall was then generously infiltrated with 1% lidocaine for local anesthesia along the tissue tract. Small stab incision was made with 11 blade scalpel, and then the catheter was back tunneled to the puncture site at the right internal jugular vein. Catheter was pulled through the tract, with the catheter amputated at 18 cm. Catheter was advanced through the peel-away sheath, and the peel-away sheath was removed. Final image was  stored. The catheter was anchored to the chest wall with 2 retention sutures, and Derma bond was used to seal the right internal jugular vein incision site and at the right chest wall. Patient tolerated the procedure well and remained hemodynamically stable throughout. No complications were encountered and no significant blood loss was encountered. IMPRESSION: Status post image guided right IJ tunneled, cuffed central venous catheter. Signed, Yvone Neu. Miachel Roux, RPVI [facility] Radiology Electronically Signed   By: Gilmer Mor D.O.   On: 02/02/2023 15:09   IR THORACIC DISC ASPIRATION W/IMG GUIDE  Result Date: 02/02/2023 CLINICAL DATA:  67 year old female with imaging findings concerning for discitis/osteomyelitis at T4-5. EXAM: FLUOROSCOPY GUIDED T5 CORE BONE BIOPSY AND OR T4-5 DISC FINE-NEEDLE ASPIRATION ANESTHESIA/SEDATION: Moderate sedation. MEDICATIONS: 75 mcg of fentanyl and 2 mg of Versed were administered intravenously for moderate conscious sedation monitored under my direct supervision. Total intraservice time of sedation was 24 minute. The patient's vital signs were monitored throughout the procedure and recorded in the patient's medical record by the nurse. Radiation dose: PSD 320 mGy. CONTRAST:  None. PROCEDURE: The patient was placed in prone position on the angiography table. The thoracic spine region was prepped and draped in a sterile fashion. Under fluoroscopy, the T5 vertebral body was delineated and the skin area was marked. The skin was infiltrated with a 1% Lidocaine approximately 3 cm lateral to the spinous process projection on the right. Using a 22-gauge spinal needle, the soft issue and the peripedicular space and periosteum were infiltrated with Bupivacaine 0.5%. A skin incision was made at the access site. Subsequently, an 11-gauge Kyphon trocar was inserted under fluoroscopic guidance until contact with the pedicle was  obtained. The trocar was inserted under light hammer tapping into the pedicle until the posterior boundaries of the vertebral body was reached. The diamond mandrill was removed and one core biopsy was obtained. Then, a 22 gauge x 20 cm she by needle was advanced through the endplate into the Z6-1 disc space and a fine-needle aspiration sample was collected. Additional sample was collected with an 18 gauge x 20 cm needle. The trocar was later removed. The access site was cleaned and covered with a sterile bandage. COMPLICATIONS: None immediate FINDINGS: Not applicable. IMPRESSION: Successful fluoroscopic guided T5 vertebral body core biopsy and T4-5 disc fine-needle aspiration. Core biopsy sample was sent for tissue exam. Fine-needle aspiration samples were sent for Gram stain and culture. Electronically Signed   By: Baldemar Lenis M.D.   On: 02/02/2023 14:42   MR CERVICAL SPINE WO CONTRAST  Result Date: 02/01/2023 CLINICAL DATA:  Discitis osteomyelitis EXAM: MRI CERVICAL SPINE WITHOUT CONTRAST TECHNIQUE: Multiplanar, multisequence MR imaging of the cervical spine was performed. No intravenous contrast was administered. COMPARISON:  No prior MRI of the cervical spine available, correlation is made with MRI thoracic spine  01/28/2023, CT head and cervical spine 10/16/2021 FINDINGS: Alignment: Preservation of the normal cervical lordosis. No significant listhesis. Vertebrae: No acute fracture, suspicious osseous lesion, or evidence of discitis in the cervical spine. At the inferior aspect of the field of view, the known T4-T5 discitis osteomyelitis is seen on the sagittal images only, which appears overall similar to the 01/28/2023 MRI. Cord: Normal signal and morphology.  No epidural collection. Posterior Fossa, vertebral arteries, paraspinal tissues: Posterior fossa arachnoid cyst, which appears unchanged from the 10/16/2021 CT. Otherwise negative. Disc levels: C2-C3: Small central disc protrusion.  Mild facet arthropathy. No spinal canal stenosis or neural foraminal narrowing. C3-C4: Mild disc bulge. Facet and uncovertebral hypertrophy. No spinal canal stenosis. Mild right neural foraminal narrowing. C4-C5: Minimal disc bulge. Facet and uncovertebral hypertrophy. No spinal canal stenosis. Mild left-greater-than-right neural foraminal narrowing. C5-C6: Disc bulge with superimposed central protrusion, which indents the ventral thecal sac and spinal cord, causing mild cord deformation but no cord signal abnormality. Facet and uncovertebral hypertrophy. Moderate spinal canal stenosis. Mild left and moderate to severe right neural foraminal narrowing. C6-C7: Central disc protrusion, which indents the ventral thecal sac and contacts the spinal cord. Facet arthropathy. Mild spinal canal stenosis. No neural foraminal narrowing. C7-T1: No significant disc bulge. No spinal canal stenosis or neuroforaminal narrowing. IMPRESSION: 1. No evidence of discitis or osteomyelitis in the cervical spine. 2. C5-C6 moderate spinal canal stenosis with moderate to severe right and mild left neural foraminal narrowing. 3. C6-C7 mild spinal canal stenosis. 4. C3-C4 mild right and C4-C5 mild bilateral neural foraminal narrowing. 5. At the inferior aspect of the field of view on the sagittal images, the known T4-T5 discitis osteomyelitis appears unchanged from the 01/28/2023 MRI. Electronically Signed   By: Wiliam Ke M.D.   On: 02/01/2023 20:36   [name] EKG SITE RITE  Result Date: 02/01/2023 If Site Rite image not attached, placement could not be confirmed due to current cardiac rhythm.  DG Chest Port 1 View  Result Date: 01/31/2023 CLINICAL DATA:  Chest pain EXAM: PORTABLE CHEST 1 VIEW COMPARISON:  01/28/2023, 12/13/2022, CT 01/28/2023, CT 03/25/2022 FINDINGS: Hypoventilatory changes. Mild cardiomegaly without overt edema or pleural effusion. Atelectasis or scarring in the right lower lung. Hardware in the spine. Chronic deformity  of the proximal left humerus. IMPRESSION: Hypoventilatory changes with atelectasis or scarring in the right lower lung. Mild cardiomegaly. Electronically Signed   By: Jasmine Pang M.D.   On: 01/31/2023 23:09   IR THORACIC DISC ASPIRATION W/IMG GUIDE  Result Date: 01/31/2023 INDICATION: 67 year old female with imaging findings concerning for discitis/osteomyelitis at T4-5. EXAM: FLUOROSCOPY GUIDED T4-5 DISC ASPIRATION. MEDICATIONS: No antibiotics administered. ANESTHESIA/SEDATION: Local anesthesia was employed during this procedure. A total of fentanyl 75 mcg was also administered intravenously by the radiology nurse. The patient's level of consciousness and vital signs were monitored continuously by radiology nursing throughout the procedure under my direct supervision. COMPLICATIONS: None immediate. PROCEDURE: Informed written consent was obtained from the patient after a thorough discussion of the procedural risks, benefits and alternatives. All questions were addressed. Maximal Sterile Barrier Technique was utilized including caps, mask, sterile gowns, sterile gloves, sterile drape, hand hygiene and skin antiseptic. A timeout was performed prior to the initiation of the procedure. Multiple attempts made to reach the T4-5 intervertebral disc with a 20 gauge needle proved unsuccessful as no adequate and safe soft tissue window could be found. No consent was obtained for different approach and patient was uncomfortable on the table and could not tolerate  further attempts. Therefore, decision was made to stop. IMPRESSION: Unsuccessful attempt at aspiration of the T4-5 intervertebral disc space. Electronically Signed   By: Baldemar Lenis M.D.   On: 01/31/2023 12:54    Labs:  CBC: Recent Labs    02/04/23 0430 02/16/23 1520 02/26/23 0825 02/27/23 0747  WBC 5.0 6.9 7.5 8.4  HGB 7.7* 8.5* 9.2* 9.1*  HCT 26.0* 27.5* 30.3* 29.7*  PLT 162 156 142* 136*    COAGS: Recent Labs     03/25/22 1911 06/23/22 0520 06/23/22 1708 01/30/23 0858 02/26/23 0825  INR 1.1 1.2  --  1.2 1.3*  APTT 34  --  29  --   --     BMP: Recent Labs    02/02/23 0413 02/03/23 0530 02/16/23 1520 02/26/23 0825 02/27/23 0747  NA 136 136 137 139 139  K 4.7 4.7 4.6 4.3 4.6  CL 112* 111 108 109 112*  CO2 18* 15* 15* 16* 18*  GLUCOSE 92 105* 70 209* 130*  BUN 73* 63* 60* 53* 50*  CALCIUM 8.0* 8.1* 7.6* 8.2* 7.9*  CREATININE 5.10* 4.79* 5.69* 4.61* 4.74*  GFRNONAA 9* 9*  --  10* 10*    LIVER FUNCTION TESTS: Recent Labs    01/12/23 1419 01/29/23 0025 02/16/23 1520 02/26/23 0825 02/27/23 0747  BILITOT 0.5 0.6 0.4 0.6 0.4  AST 10* 10* 12 12* 11*  ALT 5 6 5* 9 8  ALKPHOS 137* 131*  --  157* 151*  PROT 7.8 7.4 7.5 8.3* 7.9  ALBUMIN 3.5 2.9*  --  3.1* 2.8*    TUMOR MARKERS: No results for input(s): "AFPTM", "CEA", "CA199", "CHROMGRNA" in the last 8760 hours.  Assessment and Plan:  Scheduled tentatively for T4-5 disc aspiration/ biopsy in IR Pt wants to sign consent only after speaking to her husband---- he will be here this afternoon Will need off Eliquis 2 days---plan for procedure 5/3--- pt is aware and agreeable IR will return to see pt either today or tomorrow to see about consent   Thank you for this interesting consult.  I greatly enjoyed meeting Hasina Ahliya Glatt and look forward to participating in their care.  A copy of this report was sent to the requesting provider on this date.  Electronically Signed: Robet Leu, PA-C 02/28/2023, 11:30 AM   I spent a total of 20 Minutes    in face to face in clinical consultation, greater than 50% of which was counseling/coordinating care for T4-5 disc aspiration/biopsy

## 2023-02-28 NOTE — Progress Notes (Signed)
[facility]  Date of Admission:  02/26/2023           Reason for visit: Follow up on discitis/osteomyelitis  Current antibiotics: Daptomycin  ASSESSMENT:    67 y.o. adult admitted with:  Discitis/osteomyelitis: Patient with extensive history of infection in her back with known thoracolumbar discitis/osteomyelitis and instrumentation.  Previously in March 2021 she was treated for Pseudomonas, however, more recent cultures have yielded MRSE and she is currently on a prolonged course of daptomycin due to this planned through 03/30/2023 tentatively.  However, she has been readmitted now with worsening back pain and MRI showing new osteomyelitis at T3 with vertebral collapse of T4 since her previous MRI in March.  Additionally, there is a mild increase in size of suspected dorsal epidural fluid collection measuring 7 mm.  Her inflammatory markers are somewhat improved and she is afebrile.  Awaiting IR and neurosurgery discussion for repeat aspiration. CKD stage V: Stable. Cirrhosis: Stable.   Obesity: Body mass index is 35.13 kg/m. Nonhealing left heel wound: Evaluated by wound care whom she also follows with as an outpatient.  RECOMMENDATIONS:    Continue daptomycin per pharmacy Await IR and neurosurgery evaluation Anticipate no surgical intervention but hopefully can have repeat aspiration to determine if antibiotics need to be adjusted Overall, her net state of immunosuppression is impacted by her chronic kidney and liver disease as well as history of diabetes.  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 acetaminophen  1,000 mg Oral TID  . apixaban  2.5 mg Oral BID  . calcium carbonate  1 tablet Oral Daily  . carvedilol  6.25 mg Oral BID WC  . Chlorhexidine Gluconate Cloth  6 each Topical Daily  . diclofenac Sodium  4 g Topical QID  . DULoxetine  60 mg Oral Daily  . insulin aspart  0-15 Units Subcutaneous TID WC  . lidocaine  2 patch Transdermal Q24H  . mometasone-formoterol  2 puff Inhalation BID  . pregabalin  75 mg Oral Daily  . sodium bicarbonate  650 mg Oral Daily  . sodium chloride flush  3 mL Intravenous Q12H   Continuous Infusions: . DAPTOmycin (CUBICIN) 650 mg in sodium chloride 0.9 % IVPB 650 mg (02/27/23 1628)   PRN Meds:.albuterol, diazepam, HYDROmorphone (DILAUDID) injection, ondansetron (ZOFRAN) IV, oxyCODONE, polyethylene glycol  SUBJECTIVE:  24 hour events:  No acute events noted overnight 9 afebrile, Tmax 98.5 Repeat inflammatory markers actually improved Blood cultures remain no growth Repeat thoracic MRI obtained yesterday showed persistent discitis/osteomyelitis at T4-T5 with new marrow edema at T3 likely representative of osteomyelitis.  There was also mild increase in size of dorsal epidural fluid collection likely abscess or phlegmon.  She continues to  report unrelenting back pain.  Review of Systems  All other systems reviewed and are negative.     OBJECTIVE:   Blood pressure (!) 142/75, pulse 78, temperature 98.5 F (36.9 C), temperature source Oral, resp. rate 18, height 4\' 11"  (1.499 m), weight 78.9 kg, SpO2 100 %. Body mass index is 35.13 kg/m.  Physical Exam Constitutional:      General: She is not in acute distress. HENT:     Head: Normocephalic and atraumatic.  Eyes:     Extraocular Movements: Extraocular movements intact.     Conjunctiva/sclera: Conjunctivae normal.  Pulmonary:     Effort: Pulmonary effort is normal. No respiratory distress.  Abdominal:     General: There is no distension.     Palpations: Abdomen is soft.  Musculoskeletal:     Cervical back: Normal range of motion and neck supple.  Skin:    General: Skin is warm and dry.  Neurological:     General: No focal deficit present.     Mental Status: She is alert and oriented to person, place, and time.  Psychiatric:        Mood and Affect: Mood normal.        Behavior: Behavior normal.      Lab Results: Lab Results  Component Value Date   WBC 8.4 02/27/2023   HGB 9.1 (L) 02/27/2023   HCT 29.7 (L) 02/27/2023   MCV 89.2 02/27/2023   PLT 136 (L) 02/27/2023    Lab Results  Component Value Date   NA 139 02/27/2023   K 4.6 02/27/2023   CO2 18 (L) 02/27/2023   GLUCOSE 130 (H) 02/27/2023   BUN 50 (H) 02/27/2023   CREATININE 4.74 (H) 02/27/2023   CALCIUM 7.9 (L) 02/27/2023   GFRNONAA 10 (L) 02/27/2023   GFRAA 31 (L) 06/16/2020    Lab Results  Component Value Date   ALT 8 02/27/2023   AST 11 (L) 02/27/2023   ALKPHOS 151 (H) 02/27/2023   BILITOT 0.4 02/27/2023       Component Value Date/Time   CRP 1.0 (H) 02/27/2023 0747       Component Value Date/Time   ESRSEDRATE 74 (H) 02/27/2023 0747     I have reviewed the micro and lab results in Epic.  Imaging: MR THORACIC SPINE WO CONTRAST  Result Date: 02/27/2023 CLINICAL DATA:   Mid-back pain, infection suspected, no prior imaging EXAM: MRI THORACIC SPINE WITHOUT CONTRAST TECHNIQUE: Multiplanar, multisequence MR imaging of the thoracic spine was performed. No intravenous contrast was administered. COMPARISON:  MRI January 28, 2023. FINDINGS: Alignment: No substantial sagittal subluxation. Mildly exaggerated thoracic kyphosis. Vertebrae: Continue changes of discitis/osteomyelitis at T4-T5, including marrow edema and disc edema. Mild marrow edema at T3 is new and likely represents osteomyelitis. In comparison to January 28, 2023, mild interval collapse in the T4 vertebral body. For example, T4 vertebral body height is now 8 mm (previously 12 mm when remeasured). T4 vertebral body height is similar with continued erosive changes involving the endplates. Cord:  Normal cord signal. Paraspinal and other soft tissues: Paraspinal edema at T4-5, likely phlegmon. Disc levels: Mild interval  increase in size of a dorsal epidural fluid collection measuring 7 mm, likely abscess or phlegmon. This along with endplate spurring at T4-T5 results in progressive moderate canal stenosis. Similar superimposed multilevel degenerative change. IMPRESSION: 1. Persistent T4-T5 discitis/osteomyelitis. In comparison to January 28, 2023, mild interval collapse in the T4 vertebral body. Mild new marrow edema at T3 is new and likely represents osteomyelitis. 2. Mild interval increase in size of a suspected dorsal epidural fluid collection measuring 7 mm, possibly abscess or phlegmon. Postcontrast imaging could help confirm and further characterize if clinically warranted. This along with endplate spurring at T4-T5 results in moderate canal stenosis. 3. Paraspinal phlegmon at T4-T5. Electronically Signed   By: Feliberto Harts M.D.   On: 02/27/2023 11:23   CT CHEST ABDOMEN PELVIS WO CONTRAST  Result Date: 02/26/2023 CLINICAL DATA:  Upper back pain. EXAM: CT CHEST, ABDOMEN AND PELVIS WITHOUT CONTRAST TECHNIQUE: Multidetector  CT imaging of the chest, abdomen and pelvis was performed following the standard protocol without IV contrast. RADIATION DOSE REDUCTION: This exam was performed according to the departmental dose-optimization program which includes automated exposure control, adjustment of the mA and/or kV according to patient size and/or use of iterative reconstruction technique. COMPARISON:  June 20, 2022.  January 28, 2023. FINDINGS: CT CHEST FINDINGS Cardiovascular: Atherosclerosis of thoracic aorta is noted without aneurysm or dissection. Normal cardiac size. No pericardial effusion. Coronary artery calcifications are noted. Mediastinum/Nodes: No enlarged mediastinal, hilar, or axillary lymph nodes. Thyroid gland, trachea, and esophagus demonstrate no significant findings. Lungs/Pleura: No pneumothorax or pleural effusion is noted. Minimal bibasilar subsegmental atelectasis is noted. Musculoskeletal: Erosive changes are again seen involving the T4-5 disc space as noted on prior CT scan most likely representing sequela of prior osteomyelitis and discitis at this level. There is also noted continued abnormal soft tissue density in the paraspinal region at this level. There does appear to be increased resorption of the T5 vertebral body and acute osteomyelitis cannot be excluded. Status post surgical posterior fusion extending from T8 to L3. CT ABDOMEN PELVIS FINDINGS Hepatobiliary: Status post cholecystectomy. Consistent with hepatic cirrhosis. No biliary dilatation is noted. Pancreas: Unremarkable. No pancreatic ductal dilatation or surrounding inflammatory changes. Spleen: Normal in size without focal abnormality. Adrenals/Urinary Tract: Adrenal glands are unremarkable. Kidneys are normal, without renal calculi, focal lesion, or hydronephrosis. Bladder is unremarkable. Stomach/Bowel: Stomach is within normal limits. Appendix appears normal. No evidence of bowel wall thickening, distention, or inflammatory changes.  Vascular/Lymphatic: Aortic atherosclerosis. No enlarged abdominal or pelvic lymph nodes. Reproductive: Status post hysterectomy. No adnexal masses. Other: No abdominal wall hernia or abnormality. No abdominopelvic ascites. Musculoskeletal: Status post surgical fusion extending from thoracic spine to L3 level, with probable corpectomy present at L1. No acute osseous abnormality is noted. IMPRESSION: Erosive changes are again seen involving the T4-5 disc space as noted on prior CT scan most likely sequela of prior osteomyelitis and discitis. Continued abnormal soft tissue density is noted in paraspinal region as well. There does appear to be increased resorption of T5 vertebral body and acute osteomyelitis cannot be excluded. MRI may be performed for further evaluation. Hepatic cirrhosis.  Status post cholecystectomy. Extensive postsurgical changes are seen involving the thoracic and lumbar spine as described above, with probable corpectomy present at L1. Coronary artery calcifications are noted. Aortic Atherosclerosis (ICD10-I70.0). Electronically Signed   By: Lupita Raider M.D.   On: 02/26/2023 13:36     Imaging independently reviewed in Epic.    Lady Deutscher [facility] [phone] pager 02/28/2023, 11:09 AM

## 2023-02-28 NOTE — Plan of Care (Signed)

## 2023-02-28 NOTE — Telephone Encounter
Pt calls that her BP has been high again  155/90 today  after medications.  Has been off the irbesartan for about 3 months. It was stopped because her BP had been stable.  Noticed that her BP has been going up in the past 2-3 weeks, and today is the highest it has been.  Has been working out at [employer], and might have lost a little weight, but would like to start back on the irbesartan again.    Refill entered.

## 2023-02-28 NOTE — Consult Note (Signed)
WOC Nurse Consult Note: Reason for Consult:Chronic, nonhealing wound to left heel. Followed by Dr. Nelda Severe at the outpatient wound care center in [facility] Last seen by that provider on 01/15/23. Follows monthly. Wound type:Neuropathic Pressure Injury POA:N/A Measurement:1.5cm x 1.5cm x 1cm Wound WUJ:WJXB, red Drainage (amount, consistency, odor)  Periwound:small serosanguinous Dressing procedure/placement/frequency: I will implement a POC consistent with what Dr. Nelda Severe uses at the outpatient [facility], albeit with a substitution for collagen as that is non formulary in house. We will use a daily application of silver hydrofiber topped with dry gauze and secures with foam.  Heels are to be floated for PI prevention.  Discussed POC with Bedside RN J. Christell Constant, Wound Treatment Associate, Certified [facility]).  WOC nursing team will not follow, but will remain available to this patient, the nursing and medical teams.  Please re-consult if needed.  Thank you for inviting [name] to participate in this patient's Plan of Care.  Ladona Mow, MSN, RN, CNS, GNP, Leda Min, [employer], [employer] phone:  [phone]

## 2023-02-28 DEATH — deceased

## 2023-03-01 ENCOUNTER — Inpatient Hospital Stay (HOSPITAL_COMMUNITY): Payer: PRIVATE HEALTH INSURANCE

## 2023-03-01 DIAGNOSIS — N179 Acute kidney failure, unspecified: Secondary | ICD-10-CM

## 2023-03-01 DIAGNOSIS — N189 Chronic kidney disease, unspecified: Secondary | ICD-10-CM

## 2023-03-01 DIAGNOSIS — R338 Other retention of urine: Secondary | ICD-10-CM

## 2023-03-01 DIAGNOSIS — M4624 Osteomyelitis of vertebra, thoracic region: Secondary | ICD-10-CM | POA: Diagnosis not present

## 2023-03-01 LAB — CBC
HCT: 26.2 % — ABNORMAL LOW (ref 36.0–46.0)
Hemoglobin: 8 g/dL — ABNORMAL LOW (ref 12.0–15.0)
MCH: 27.7 pg (ref 26.0–34.0)
MCHC: 30.5 g/dL (ref 30.0–36.0)
MCV: 90.7 fL (ref 80.0–100.0)
Platelets: 111 10*3/uL — ABNORMAL LOW (ref 150–400)
RBC: 2.89 MIL/uL — ABNORMAL LOW (ref 3.87–5.11)
RDW: 15.9 % — ABNORMAL HIGH (ref 11.5–15.5)
WBC: 5.8 10*3/uL (ref 4.0–10.5)
nRBC: 0 % (ref 0.0–0.2)

## 2023-03-01 LAB — RENAL FUNCTION PANEL
Albumin: 2.7 g/dL — ABNORMAL LOW (ref 3.5–5.0)
Anion gap: 12 (ref 5–15)
BUN: 80 mg/dL — ABNORMAL HIGH (ref 8–23)
CO2: 15 mmol/L — ABNORMAL LOW (ref 22–32)
Calcium: 7.3 mg/dL — ABNORMAL LOW (ref 8.9–10.3)
Chloride: 105 mmol/L (ref 98–111)
Creatinine, Ser: 7.83 mg/dL — ABNORMAL HIGH (ref 0.44–1.00)
GFR, Estimated: 5 mL/min — ABNORMAL LOW (ref >60)
Glucose, Bld: 59 mg/dL — ABNORMAL LOW (ref 70–99)
Phosphorus: 6.2 mg/dL — ABNORMAL HIGH (ref 2.5–4.6)
Potassium: 4.8 mmol/L (ref 3.5–5.1)
Sodium: 132 mmol/L — ABNORMAL LOW (ref 135–145)

## 2023-03-01 LAB — BASIC METABOLIC PANEL
Anion gap: 9 (ref 5–15)
BUN: 80 mg/dL — ABNORMAL HIGH (ref 8–23)
CO2: 16 mmol/L — ABNORMAL LOW (ref 22–32)
Calcium: 7.4 mg/dL — ABNORMAL LOW (ref 8.9–10.3)
Chloride: 108 mmol/L (ref 98–111)
Creatinine, Ser: 7.62 mg/dL — ABNORMAL HIGH (ref 0.44–1.00)
GFR, Estimated: 5 mL/min — ABNORMAL LOW (ref >60)
Glucose, Bld: 69 mg/dL — ABNORMAL LOW (ref 70–99)
Potassium: 4.6 mmol/L (ref 3.5–5.1)
Sodium: 133 mmol/L — ABNORMAL LOW (ref 135–145)

## 2023-03-01 LAB — SODIUM, URINE, RANDOM: Sodium, Ur: 47 mmol/L

## 2023-03-01 LAB — GLUCOSE, CAPILLARY
Glucose-Capillary: 60 mg/dL — ABNORMAL LOW (ref 70–99)
Glucose-Capillary: 68 mg/dL — ABNORMAL LOW (ref 70–99)
Glucose-Capillary: 73 mg/dL (ref 70–99)
Glucose-Capillary: 79 mg/dL (ref 70–99)

## 2023-03-01 LAB — CREATININE, URINE, RANDOM: Creatinine, Urine: 109 mg/dL

## 2023-03-01 MED ORDER — MUPIROCIN 2 % EX OINT
1.0000 | TOPICAL_OINTMENT | Freq: Two times a day (BID) | CUTANEOUS | Status: AC
  Administered 2023-03-02 – 2023-03-06 (×10): 1 via NASAL
  Filled 2023-03-01 (×5): qty 22

## 2023-03-01 MED ORDER — BISACODYL 5 MG PO TBEC: 5.0000 mg | DELAYED_RELEASE_TABLET | Freq: Every day | ORAL | Status: DC | PRN

## 2023-03-01 MED ORDER — OXYCODONE HCL 5 MG PO TABS
10.0000 mg | ORAL_TABLET | ORAL | Status: DC | PRN
  Administered 2023-03-01 – 2023-03-04 (×9): 10 mg via ORAL
  Filled 2023-03-01 (×10): qty 2

## 2023-03-01 MED ORDER — NALOXONE HCL 0.4 MG/ML IJ SOLN
0.4000 mg | INTRAMUSCULAR | Status: DC | PRN
  Administered 2023-04-01 (×3): 0.4 mg via INTRAVENOUS
  Filled 2023-03-01 (×3): qty 1

## 2023-03-01 MED ORDER — SENNOSIDES-DOCUSATE SODIUM 8.6-50 MG PO TABS
1.0000 | ORAL_TABLET | Freq: Two times a day (BID) | ORAL | Status: DC
  Administered 2023-03-01 – 2023-03-04 (×5): 1 via ORAL
  Filled 2023-03-01 (×9): qty 1

## 2023-03-01 MED ORDER — POLYETHYLENE GLYCOL 3350 17 G PO PACK
17.0000 g | PACK | Freq: Every day | ORAL | Status: DC
  Administered 2023-03-01 – 2023-03-03 (×2): 17 g via ORAL
  Filled 2023-03-01 (×4): qty 1

## 2023-03-01 MED ORDER — METHOCARBAMOL 500 MG PO TABS
500.0000 mg | ORAL_TABLET | Freq: Three times a day (TID) | ORAL | Status: DC
  Administered 2023-03-01 – 2023-03-08 (×20): 500 mg via ORAL
  Filled 2023-03-01 (×20): qty 1

## 2023-03-01 MED ORDER — HYDROMORPHONE HCL 1 MG/ML IJ SOLN
1.0000 mg | INTRAMUSCULAR | Status: DC | PRN
  Administered 2023-03-01 – 2023-03-04 (×13): 1 mg via INTRAVENOUS
  Filled 2023-03-01 (×13): qty 1

## 2023-03-01 MED ORDER — SODIUM CHLORIDE 0.9 % IV SOLN: INTRAVENOUS | Status: DC

## 2023-03-01 MED ORDER — SODIUM BICARBONATE 650 MG PO TABS
650.0000 mg | ORAL_TABLET | Freq: Two times a day (BID) | ORAL | Status: DC
  Administered 2023-03-01: 650 mg via ORAL
  Filled 2023-03-01: qty 1

## 2023-03-01 MED ORDER — OXYCODONE HCL 5 MG PO TABS
10.0000 mg | ORAL_TABLET | ORAL | Status: DC | PRN
  Administered 2023-03-01 (×2): 10 mg via ORAL
  Filled 2023-03-01 (×2): qty 2

## 2023-03-01 NOTE — Progress Notes (Signed)
   Pt is scheduled for Thoracic 4-5 disc aspiration in IR 5/3  Risks and benefits of thoracic 4-5 disc aspiration/biopsy  was discussed with the patient and/or patient's family including, but not limited to bleeding, infection, damage to adjacent structures or low yield requiring additional tests.  All of the questions were answered and there is agreement to proceed.  Consent signed and in chart.

## 2023-03-01 NOTE — Progress Notes (Signed)
Foley placed per order if residual greater than 300cc. Tolerated well. I was assisted and chaperoned by nurse Heidi. Return on placement of . Urine sample sent per orders.
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
## 2023-03-01 NOTE — Progress Notes (Addendum)
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 Last month, diagnosed with T4-T5 discitis osteomyelitis which was being treated with daptomycin.  Neurosurgery did not recommend any further interventions.  The patient returns to the hospital for worsening back pain for [redacted] week along with an episode of vomiting. CT chest abdomen pelvis: Prior T4-T5 osteomyelitis discitis, difficult to rule out osteomyelitis and T5, continued paraspinal soft tissue density. 4/30: ID consult 5/2: IR plans scheduled T4-5 disc aspiration on 5/3.  Communicated with neurosurgery (note by Dr. Maisie Fus), recommend continued medical management.  Subjective:  Ongoing severe pain, reports "all over" but appears to mainly indicate in the mid back.  Current pain meds not controlling pain.  States that she has had chronic pain at home for which she takes opioids but does not follow with pain management.  Also reports seeing Dr. Ronalee Belts, Nephrology but denies any discussion regarding hemodialysis.  Assessment and Plan: Principal Problem:   Acute osteomyelitis/discitis of thoracic spine, rule out abscess- recently grew MRSE -MRI obtained and reveals new T3 marrow edema, persistent T4 and T5 infection, epidural fluid collection increased from prior imaging of about 7 mm-abscess versus phlegmon, paraspinal phlegmon at T4-T5 -CRP 1.0, ESR 74, platelets dropping to 136 from 150s 160s - IR has been consulted-the patient will have to be off of Eliquis for 2 days and procedure planned for 5/3 - ID recommends to continue daptomycin per  pharmacy -Discussed with Dr. Maisie Fus, neurosurgery and his note appreciated.  Recommends medical management and patient is at high risk for any surgical intervention. -Multimodality pain control.  Continue scheduled Tylenol, as needed Oxy IR increased from 5 mg to 10 mg every 4 hours as needed for moderate pain and breakthrough pain, continue as needed IV Dilaudid for severe pain, continue Lyrica (hesitant to increase dose given acute on chronic kidney disease), on Cymbalta and added Robaxin for muscle spasms.  Also on lidocaine patch.  Was briefly on Dilaudid PCA on 4/30, unclear why this was stopped.  If pain not controlled on above, may need to consider PCA.  Continuous pulse oximetry.  Continue telemetry monitoring.  Active Problems: Acute kidney injury complicating CKD stage V/non-anion gap metabolic acidosis Patient reports that she follows with Dr. Ronalee Belts, nephrology.  Is not aware of any dialysis discussions.  Baseline creatinine this year has been between 4-5 mostly.  Creatinine has abruptly increased to 7.62.  No urinary retention yesterday but this afternoon had bladder scan of 338 mL.  In and out catheterization as needed.  AKI possibly multifactorial,?  Hemodynamics (soft BPs yesterday including BP of 99/56 overnight.) and may be an element of urinary retention.  Also per report to prior [facility] MD, patient not taking sodium bicarbonate and was started here.  Agree with renal ultrasound, ordered by nephrology.  Nephrology consulted for assistance.  Continue trending daily BMP.  Avoid nephrotoxics and hypotension.   HTN, h/o mod LVH grade 1 diastolic dysfunction - cont Coreg- hold Lasix    Hepatic cirrhosis, coagulopathy, mild splenomegaly - INR 1.2- 1.3.  Unclear why INR was checked while patient was on Eliquis PTA.  Post arm pain and upper back pain - likely musculoskeletal sprain/ spasm- seems that this pain is worse than her mild back pain -  voltaren gel and k pad for now.  Pain  management as noted above.  DM with hypoglycemia: A1c 4.6 on 4/18.  Hypoglycemic range CBGs on 5/2 (60 > 68).  Discontinued SSI.  It appears that her glycemic control has improved, likely related to development of advanced CKD.  She had A1c of up to 8.1 in August 2021.  Wound LLE - follows with wound care as outpt-  -WOC RN input from 5/1 appreciated.  Refer to their note for details.   H/o DVT - on Eliquis, currently on hold for procedures.  Resume postprocedure when cleared by IR.  ? Constipation - occasional- dose not take Amitiza as listed on med rec and is not aware of it.  Last BM 4/27.  Maximize bowel regimen.  Anxiety, pain - Cymbalta, PRN Valium  Body mass index is 35.89 kg/m./Obesity Complicates care.  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  Fluctuating in the 8-9 range.  No overt bleeding reported.  Thrombocytopenia Platelet counts gradually dropping.?  Related to antimicrobials versus acute infection.  Continue to trend daily CBCs.  NOTE: she states she uses PRN O2 at home.  O2 turned off 5/1 and advised to keep it off unless she becomes hypoxic- ? If used for OSA in the past   ACP documents: None present.   Code Status: Full Code Consultants: ID, IR, nephrology DVT prophylaxis: Eliquis held.  SCDs. Family communication: Spouse via phone.    Objective:   Vitals:   02/28/23 2051 03/01/23 0439 03/01/23 0500 03/01/23 0801  BP:  128/65  (!) 113/48  Pulse:  75  72  Resp:  17  18  Temp:  (!) 97.5 F (36.4 C)  97.6 F (36.4 C)  TempSrc:  Oral  Oral  SpO2: 97% 96%  (!) 87%  Weight:   80.6 kg   Height:       Filed Weights   02/27/23 0500 02/28/23 0500 03/01/23 0500  Weight: 79 kg 78.9 kg 80.6 kg   Exam: General exam: Middle-age female, looks older than stated age, moderately built and obese, lying uncomfortably in  bed with ongoing pain. Respiratory system: Clear to auscultation. Respiratory effort normal. Cardiovascular system: S1 & S2 heard, RRR. No JVD, murmurs, rubs, gallops or clicks. No pedal edema.  Telemetry personally reviewed: Sinus rhythm. Gastrointestinal system: Abdomen is nondistended, soft and nontender. No organomegaly or masses felt. Normal bowel sounds heard. Central nervous system: Alert and oriented. No focal neurological deficits. Extremities: Symmetric 5 x 5 power. Skin: No rashes, lesions or ulcers Psychiatry: Judgement and insight appear normal. Mood & affect, unable to assess, in pain.       CBC: Recent Labs  Lab 02/26/23 0825 02/27/23 0747 03/01/23 0428  WBC 7.5 8.4 5.8  NEUTROABS 6.6  --   --   HGB 9.2* 9.1* 8.0*  HCT 30.3* 29.7* 26.2*  MCV 91.0 89.2 90.7  PLT 142* 136* 111*   Basic Metabolic Panel: Recent Labs  Lab 02/26/23 0825 02/27/23 0747 03/01/23 0428  NA 139 139 133*  K 4.3 4.6 4.6  CL 109 112* 108  CO2 16* 18* 16*  GLUCOSE 209* 130* 69*  BUN 53* 50* 80*  CREATININE 4.61* 4.74*  7.62*  CALCIUM 8.2* 7.9* 7.4*   GFR: Estimated Creatinine Clearance (by C-G formula based on SCr of 7.62 mg/dL (H)) Female: 6.6 mL/min (A) Female: 8.1 mL/min (A)  Scheduled Meds:  acetaminophen  1,000 mg Oral TID   calcium carbonate  1 tablet Oral Daily   carvedilol  6.25 mg Oral BID WC   Chlorhexidine Gluconate Cloth  6 each Topical Daily   diclofenac Sodium  4 g Topical QID   DULoxetine  60 mg Oral Daily   lidocaine  2 patch Transdermal Q24H   methocarbamol  500 mg Oral TID   mometasone-formoterol  2 puff Inhalation BID   pregabalin  75 mg Oral Daily   sodium bicarbonate  650 mg Oral BID   sodium chloride flush  3 mL Intravenous Q12H   Continuous Infusions:  DAPTOmycin (CUBICIN) 650 mg in sodium chloride 0.9 % IVPB 650 mg (02/27/23 1628)   Imaging and lab data was personally reviewed No results found.  LOS: 2 days   Marcellus Scott, MD,  FACP, [facility], [facility],  [facility], [facility]   [facility] [place]     To contact the attending provider between 7A-7P or the covering provider during after hours 7P-7A, please log into the web site [email or web address] and access using universal [place] password for that web site. If you do not have the password, please call the hospital operator.

## 2023-03-01 NOTE — Progress Notes (Signed)
Neurosurgery  Patient well-known to the service.  She previously underwent L1 corpectomy and cage reconstruction with T11-L3 fusion for pseudomonas osteomyelitis.  She subsequently developed T10-T11 discitis with erosion requiring revision and extension of fusion to T8.  She now is battling T4-5 discitis with increased erosive changes and kyphosis.  Due to her medical comorbidities and poor functional status and conditioning, she  is a poor candidate for another reconstructive spine surgery which would entail fusion revision and extension to T2 as well as partial or total T4 and T5 corpectomies with reconstruction.  Would recommend continued medical therapy, and it is reasonable to obtain another disc/bone biopsy.

## 2023-03-02 ENCOUNTER — Inpatient Hospital Stay (HOSPITAL_COMMUNITY): Payer: PRIVATE HEALTH INSURANCE

## 2023-03-02 DIAGNOSIS — N179 Acute kidney failure, unspecified: Secondary | ICD-10-CM | POA: Diagnosis not present

## 2023-03-02 DIAGNOSIS — M4624 Osteomyelitis of vertebra, thoracic region: Secondary | ICD-10-CM | POA: Diagnosis not present

## 2023-03-02 DIAGNOSIS — N185 Chronic kidney disease, stage 5: Secondary | ICD-10-CM | POA: Diagnosis not present

## 2023-03-02 DIAGNOSIS — K7030 Alcoholic cirrhosis of liver without ascites: Secondary | ICD-10-CM | POA: Diagnosis not present

## 2023-03-02 DIAGNOSIS — R338 Other retention of urine: Secondary | ICD-10-CM | POA: Diagnosis not present

## 2023-03-02 DIAGNOSIS — N189 Chronic kidney disease, unspecified: Secondary | ICD-10-CM | POA: Diagnosis not present

## 2023-03-02 DIAGNOSIS — M4644 Discitis, unspecified, thoracic region: Secondary | ICD-10-CM | POA: Diagnosis not present

## 2023-03-02 HISTORY — PX: IR THORACIC DISC ASPIRATION W/IMG GUIDE: IMG943

## 2023-03-02 HISTORY — PX: IR US GUIDE VASC ACCESS LEFT: IMG2389

## 2023-03-02 HISTORY — PX: IR FLUORO GUIDE CV LINE LEFT: IMG2282

## 2023-03-02 LAB — CBC
HCT: 25.8 % — ABNORMAL LOW (ref 36.0–46.0)
HCT: 27.9 % — ABNORMAL LOW (ref 36.0–46.0)
Hemoglobin: 7.6 g/dL — ABNORMAL LOW (ref 12.0–15.0)
Hemoglobin: 8.2 g/dL — ABNORMAL LOW (ref 12.0–15.0)
MCH: 27 pg (ref 26.0–34.0)
MCH: 27.4 pg (ref 26.0–34.0)
MCHC: 29.5 g/dL — ABNORMAL LOW (ref 30.0–36.0)
MCHC: 29.4 g/dL — ABNORMAL LOW (ref 30.0–36.0)
MCV: 91.5 fL (ref 80.0–100.0)
MCV: 93.3 fL (ref 80.0–100.0)
Platelets: 114 10*3/uL — ABNORMAL LOW (ref 150–400)
Platelets: 120 10*3/uL — ABNORMAL LOW (ref 150–400)
RBC: 2.82 MIL/uL — ABNORMAL LOW (ref 3.87–5.11)
RBC: 2.99 MIL/uL — ABNORMAL LOW (ref 3.87–5.11)
RDW: 15.8 % — ABNORMAL HIGH (ref 11.5–15.5)
RDW: 15.8 % — ABNORMAL HIGH (ref 11.5–15.5)
WBC: 5.5 10*3/uL (ref 4.0–10.5)
WBC: 5.9 10*3/uL (ref 4.0–10.5)
nRBC: 0 % (ref 0.0–0.2)
nRBC: 0 % (ref 0.0–0.2)

## 2023-03-02 LAB — GLUCOSE, CAPILLARY
Glucose-Capillary: 85 mg/dL (ref 70–99)
Glucose-Capillary: 66 mg/dL — ABNORMAL LOW (ref 70–99)
Glucose-Capillary: 136 mg/dL — ABNORMAL HIGH (ref 70–99)
Glucose-Capillary: 63 mg/dL — ABNORMAL LOW (ref 70–99)
Glucose-Capillary: 92 mg/dL (ref 70–99)
Glucose-Capillary: 71 mg/dL (ref 70–99)
Glucose-Capillary: 78 mg/dL (ref 70–99)

## 2023-03-02 LAB — RENAL FUNCTION PANEL
Albumin: 2.6 g/dL — ABNORMAL LOW (ref 3.5–5.0)
Albumin: 2.8 g/dL — ABNORMAL LOW (ref 3.5–5.0)
Anion gap: 9 (ref 5–15)
Anion gap: 15 (ref 5–15)
BUN: 84 mg/dL — ABNORMAL HIGH (ref 8–23)
BUN: 83 mg/dL — ABNORMAL HIGH (ref 8–23)
CO2: 15 mmol/L — ABNORMAL LOW (ref 22–32)
CO2: 13 mmol/L — ABNORMAL LOW (ref 22–32)
Calcium: 7.1 mg/dL — ABNORMAL LOW (ref 8.9–10.3)
Calcium: 7.2 mg/dL — ABNORMAL LOW (ref 8.9–10.3)
Chloride: 110 mmol/L (ref 98–111)
Chloride: 105 mmol/L (ref 98–111)
Creatinine, Ser: 8.03 mg/dL — ABNORMAL HIGH (ref 0.44–1.00)
Creatinine, Ser: 8.07 mg/dL — ABNORMAL HIGH (ref 0.44–1.00)
GFR, Estimated: 5 mL/min — ABNORMAL LOW (ref >60)
GFR, Estimated: 5 mL/min — ABNORMAL LOW (ref >60)
Glucose, Bld: 59 mg/dL — ABNORMAL LOW (ref 70–99)
Glucose, Bld: 84 mg/dL (ref 70–99)
Phosphorus: 6.4 mg/dL — ABNORMAL HIGH (ref 2.5–4.6)
Phosphorus: 6.5 mg/dL — ABNORMAL HIGH (ref 2.5–4.6)
Potassium: 4.8 mmol/L (ref 3.5–5.1)
Potassium: 5.1 mmol/L (ref 3.5–5.1)
Sodium: 134 mmol/L — ABNORMAL LOW (ref 135–145)
Sodium: 133 mmol/L — ABNORMAL LOW (ref 135–145)

## 2023-03-02 LAB — HEPATITIS B SURFACE ANTIGEN: Hepatitis B Surface Ag: NONREACTIVE

## 2023-03-02 LAB — IRON AND TIBC
Iron: 70 ug/dL (ref 28–170)
Saturation Ratios: 32 % — ABNORMAL HIGH (ref 10.4–31.8)
TIBC: 217 ug/dL — ABNORMAL LOW (ref 250–450)
UIBC: 147 ug/dL

## 2023-03-02 LAB — SURGICAL PCR SCREEN
MRSA, PCR: NEGATIVE
Staphylococcus aureus: NEGATIVE

## 2023-03-02 LAB — CULTURE, BLOOD (ROUTINE X 2)
Culture: NO GROWTH
Special Requests: ADEQUATE

## 2023-03-02 MED ORDER — HYDROMORPHONE HCL 1 MG/ML IJ SOLN
INTRAMUSCULAR | Status: AC | PRN
  Administered 2023-03-02 (×2): .5 mg via INTRAVENOUS

## 2023-03-02 MED ORDER — LIDOCAINE HCL (PF) 1 % IJ SOLN
15.0000 mL | Freq: Once | INTRAMUSCULAR | Status: DC
  Filled 2023-03-02: qty 15

## 2023-03-02 MED ORDER — BUPIVACAINE HCL 0.5 % IJ SOLN
30.0000 mL | Freq: Once | INTRAMUSCULAR | Status: AC
  Administered 2023-03-02: 20 mL
  Filled 2023-03-02: qty 50

## 2023-03-02 MED ORDER — SODIUM BICARBONATE 650 MG PO TABS
1300.0000 mg | ORAL_TABLET | Freq: Two times a day (BID) | ORAL | Status: DC
  Administered 2023-03-02 – 2023-03-04 (×5): 1300 mg via ORAL
  Filled 2023-03-02 (×5): qty 2

## 2023-03-02 MED ORDER — PENTAFLUOROPROP-TETRAFLUOROETH EX AERO: 1.0000 | INHALATION_SPRAY | CUTANEOUS | Status: DC | PRN

## 2023-03-02 MED ORDER — MIDAZOLAM HCL 2 MG/2ML IJ SOLN
INTRAMUSCULAR | Status: AC
  Filled 2023-03-02: qty 2

## 2023-03-02 MED ORDER — FENTANYL CITRATE PF 50 MCG/ML IJ SOSY
50.0000 ug | PREFILLED_SYRINGE | Freq: Once | INTRAMUSCULAR | Status: AC
  Administered 2023-03-02: 50 ug via INTRAVENOUS

## 2023-03-02 MED ORDER — DEXTROSE 10 % IV SOLN: INTRAVENOUS | Status: DC

## 2023-03-02 MED ORDER — CHLORHEXIDINE GLUCONATE CLOTH 2 % EX PADS
6.0000 | MEDICATED_PAD | Freq: Every day | CUTANEOUS | Status: DC
  Administered 2023-03-03 – 2023-03-27 (×24): 6 via TOPICAL

## 2023-03-02 MED ORDER — HYDROMORPHONE HCL 1 MG/ML IJ SOLN
INTRAMUSCULAR | Status: AC
  Filled 2023-03-02: qty 1

## 2023-03-02 MED ORDER — LIDOCAINE-PRILOCAINE 2.5-2.5 % EX CREA: 1.0000 | TOPICAL_CREAM | CUTANEOUS | Status: DC | PRN

## 2023-03-02 MED ORDER — LIDOCAINE HCL (PF) 1 % IJ SOLN
INTRAMUSCULAR | Status: AC
  Filled 2023-03-02: qty 30

## 2023-03-02 MED ORDER — ANTICOAGULANT SODIUM CITRATE 4% (200MG/5ML) IV SOLN: 5.0000 mL | Status: DC | PRN

## 2023-03-02 MED ORDER — FENTANYL CITRATE (PF) 100 MCG/2ML IJ SOLN
INTRAMUSCULAR | Status: AC
  Filled 2023-03-02: qty 2

## 2023-03-02 MED ORDER — MIDAZOLAM HCL 2 MG/2ML IJ SOLN
INTRAMUSCULAR | Status: AC | PRN
  Administered 2023-03-02 (×3): .5 mg via INTRAVENOUS

## 2023-03-02 MED ORDER — APIXABAN 2.5 MG PO TABS
2.5000 mg | ORAL_TABLET | Freq: Two times a day (BID) | ORAL | Status: DC
  Administered 2023-03-02 – 2023-03-11 (×19): 2.5 mg via ORAL
  Filled 2023-03-02 (×20): qty 1

## 2023-03-02 MED ORDER — DARBEPOETIN ALFA 40 MCG/0.4ML IJ SOSY
40.0000 ug | PREFILLED_SYRINGE | INTRAMUSCULAR | Status: DC
  Administered 2023-03-03 – 2023-03-09 (×2): 40 ug via SUBCUTANEOUS
  Filled 2023-03-02 (×2): qty 0.4

## 2023-03-02 MED ORDER — LIDOCAINE HCL 1 % IJ SOLN
30.0000 mL | Freq: Once | INTRAMUSCULAR | Status: AC
  Administered 2023-03-02: 20 mL
  Filled 2023-03-02: qty 30

## 2023-03-02 MED ORDER — HEPARIN SODIUM (PORCINE) 1000 UNIT/ML IJ SOLN
INTRAMUSCULAR | Status: AC
  Filled 2023-03-02: qty 10

## 2023-03-02 MED ORDER — DEXTROSE 50 % IV SOLN
12.5000 g | INTRAVENOUS | Status: AC
  Administered 2023-03-02: 12.5 g via INTRAVENOUS

## 2023-03-02 MED ORDER — BUPIVACAINE HCL (PF) 0.5 % IJ SOLN
INTRAMUSCULAR | Status: AC
  Filled 2023-03-02: qty 30

## 2023-03-02 MED ORDER — LIDOCAINE HCL (PF) 1 % IJ SOLN: 5.0000 mL | INTRAMUSCULAR | Status: DC | PRN

## 2023-03-02 MED ORDER — ALTEPLASE 2 MG IJ SOLR: 2.0000 mg | Freq: Once | INTRAMUSCULAR | Status: DC | PRN

## 2023-03-02 MED ORDER — HEPARIN SODIUM (PORCINE) 1000 UNIT/ML DIALYSIS
1000.0000 [IU] | INTRAMUSCULAR | Status: DC | PRN
  Filled 2023-03-02: qty 1

## 2023-03-02 MED ORDER — DEXTROSE 50 % IV SOLN
INTRAVENOUS | Status: AC
  Filled 2023-03-02: qty 50

## 2023-03-02 NOTE — Progress Notes (Signed)
Hypoglycemic Event  CBG: 63  Treatment: D50 25 mL (12.5 gm)  Symptoms:  Fatigue  Follow-up CBG: Time:0525 CBG Result:136  Possible Reasons for Event: Inadequate meal intake      Cheree Ditto, Journie Howson L

## 2023-03-02 NOTE — H&P (Signed)
   Patient Status: [facility] - In-pt  Assessment and Plan: Patient in need of venous access for HD initiation.  Patient CKD V, progressing toward renal failure with decreased UOP.  HD being considered.  Temporary dialysis catheter requested.   Risks and benefits discussed with the patient including, but not limited to bleeding, infection, vascular injury, pneumothorax which may require chest tube placement, air embolism or even death  All of the patient's questions were answered, patient is agreeable to proceed. Consent signed and in chart.  ______________________________________________________________________   History of Present Illness: [name] [name] is a 67 y.o. adult with history of CKD 5.  She is admitted with back pain, undergoing treatment and work-up for osteomyelitis/discitis.  She is planning to undergo disc aspiration in IR today.  IR also consulted for temp cath placement for initiation of HD. DIscussed with Dr. Arrie Aran who confirms plans.   Allergies and medications reviewed.   Review of Systems: A 12 point ROS discussed and pertinent positives are indicated in the HPI above.  All other systems are negative.  Review of Systems  Constitutional:  Negative for fatigue and fever.  Respiratory:  Negative for cough and shortness of breath.   Cardiovascular:  Negative for chest pain.  Gastrointestinal:  Negative for abdominal pain and nausea.  Psychiatric/Behavioral:  Negative for behavioral problems and confusion.     Vital Signs: BP (!) 108/58 (BP Location: Left Arm)   Pulse 70   Temp 98.3 F (36.8 C) (Oral)   Resp 19   Ht 4\' 11"  (1.499 m)   Wt 177 lb 11.1 oz (80.6 kg)   SpO2 96%   BMI 35.89 kg/m   Physical Exam Vitals and nursing note reviewed.  Constitutional:      General: She is not in acute distress.    Appearance: Normal appearance. She is ill-appearing.  Cardiovascular:     Rate and Rhythm: Normal rate and regular rhythm.  Pulmonary:     Effort:  Pulmonary effort is normal.     Breath sounds: Normal breath sounds.  Skin:    General: Skin is warm and dry.  Neurological:     General: No focal deficit present.     Mental Status: She is alert and oriented to person, place, and time.  Psychiatric:        Mood and Affect: Mood normal.        Behavior: Behavior normal.      Imaging reviewed.   Labs:  COAGS: Recent Labs    03/25/22 1911 06/23/22 0520 06/23/22 1708 01/30/23 0858 02/26/23 0825  INR 1.1 1.2  --  1.2 1.3*  APTT 34  --  29  --   --     BMP: Recent Labs    02/27/23 0747 03/01/23 0428 03/01/23 1601 03/02/23 0402  NA 139 133* 132* 134*  K 4.6 4.6 4.8 4.8  CL 112* 108 105 110  CO2 18* 16* 15* 15*  GLUCOSE 130* 69* 59* 59*  BUN 50* 80* 80* 84*  CALCIUM 7.9* 7.4* 7.3* 7.1*  CREATININE 4.74* 7.62* 7.83* 8.03*  GFRNONAA 10* 5* 5* 5*       Electronically Signed: Hoyt Koch, PA 03/02/2023, 12:32 PM   I spent a total of 15 minutes in face to face in clinical consultation, greater than 50% of which was counseling/coordinating care for venous access.

## 2023-03-02 NOTE — Progress Notes (Signed)
Received patient in bed to unit.  Alert and oriented.  Informed consent signed and in chart.   TX duration:2.5 hours  Patient tolerated well.  Transported back to the room  Alert, without acute distress.  Hand-off given to patient's nurse.   Access used: dialysis cath Access issues: none  Total UF removed: 0 Medication(s) given: 1858 1mg  dilaudid via dialysis cath, heparin 1.4cc per port hep lock  Post HD VS: see table below Post HD weight: 79.2kg bed scale   03/02/23 2002  Vitals  Temp 98 F (36.7 C)  Temp Source Oral  BP (!) 161/68  MAP (mmHg) 97  BP Location Right Arm  BP Method Automatic  Patient Position (if appropriate) Lying  Pulse Rate 80  Pulse Rate Source Monitor  ECG Heart Rate 80  Resp 19  Oxygen Therapy  SpO2 100 %  O2 Device Nasal Cannula  O2 Flow Rate (L/min) 3 L/min  Patient Activity (if Appropriate) In bed  Pulse Oximetry Type Continuous  During Treatment Monitoring  HD Safety Checks Performed Yes  Intra-Hemodialysis Comments Tolerated well  Post Treatment  Dialyzer Clearance Clear  Duration of HD Treatment -hour(s) 2.5 hour(s)  Hemodialysis Intake (mL) 0 mL  Liters Processed 30  Fluid Removed (mL) 0 mL  Tolerated HD Treatment Yes  Post-Hemodialysis Comments tx tolerated well  Hemodialysis Catheter Left Internal jugular Triple lumen Temporary (Non-Tunneled)  Placement Date/Time: 03/02/23 1355   Serial / Lot #: [id number]  Expiration Date: 09/01/27  Time Out: Correct patient;Correct site;Correct procedure  Site Prep: Chlorhexidine (preferred)  Local Anesthetic: Injectable - 1% Lidocaine  Ultrasound Used?: ...  Site Condition No complications  Blue Lumen Status Flushed;Dead end cap in place;Heparin locked  Red Lumen Status Flushed;Dead end cap in place;Heparin locked  Purple Lumen Status Heparin locked  Catheter fill solution Heparin 1000 units/ml  Catheter fill volume (Arterial) 1.4 cc  Catheter fill volume (Venous) 1.4       [name]

## 2023-03-02 NOTE — Progress Notes (Signed)
[facility]  Date of Admission:  02/26/2023           Reason for visit: Follow up on discitis/osteomyelitis  Current antibiotics: Daptomycin  ASSESSMENT:    67 y.o. adult admitted with:  Discitis/osteomyelitis: Patient with extensive history of infection in her back with known thoracolumbar discitis/osteomyelitis and instrumentation.  Previously, in March 2021 she was treated for thoracolumbar infection due to Pseudomonas. However, more recent cultures have yielded MRSE with T10-T11 discitis with erosion requiring revision and extension of fusion to T8 in August 2023.  Imaging in August 2023 also noted early discitis/OM of T4-5.  She was supposed to be on doxycycline suppression but did not tolerate this and subsequently stopped antibiotics after IV therapy in October 2023.  She now has progression of infection at T4-5 noted on MRI in March 2024 with aspiration again yielding MRSE and currently on a prolonged course of daptomycin due to this through 03/30/2023.  However, she has been readmitted with worsening back pain and MRI showing new osteomyelitis at T3 with vertebral collapse of T4 since her previous MRI in March.  Additionally, there is a mild increase in size of suspected dorsal epidural fluid collection measuring 7 mm.  Plan for repeat aspiration today 03/02/23. Acute on CKD stage V: Worsening renal function being followed by nephrology and may need temporary HD. Cirrhosis: Stable.   Obesity: Body mass index is 35.13 kg/m. Nonhealing left heel wound: Evaluated by wound care whom she also follows with as an outpatient.  RECOMMENDATIONS:    Continue daptomycin per pharmacy Plan for IR guided aspiration today.  If cultures again grow or MRSE, will have daptomycin susceptibilities sent.  Of note, her isolate was daptomycin sensitive in August 2023 Appreciate nephrology follow-up given her acute renal failure Following.  Dr. Thedore Mins on-call this weekend with  questions.  She and Dr. Daiva Eves will be the new ID team taking over on Monday.   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)   IDA (iron deficiency anemia)   PTSD (post-traumatic stress disorder)   Hypertension   High cholesterol   GERD (gastroesophageal reflux disease)   Fibromyalgia   Depression   Asthma   Anxiety   Anemia   Hypothyroidism   CKD (chronic kidney disease) stage 5, GFR less than 15 ml/min (HCC)   Osteomyelitis (HCC)    MEDICATIONS:    Scheduled Meds:  acetaminophen  1,000 mg Oral TID   calcium carbonate  1 tablet Oral Daily   carvedilol  6.25 mg Oral BID WC   Chlorhexidine Gluconate Cloth  6 each Topical Daily   diclofenac Sodium  4 g Topical QID   DULoxetine  60 mg Oral Daily   lidocaine  2 patch Transdermal Q24H   methocarbamol  500 mg Oral TID   mometasone-formoterol  2 puff Inhalation BID   mupirocin ointment  1 Application Nasal BID   polyethylene glycol  17 g Oral Daily   pregabalin  75 mg Oral Daily   senna-docusate  1 tablet Oral BID   sodium bicarbonate  650 mg Oral BID   sodium chloride flush  3 mL Intravenous Q12H   Continuous Infusions:  DAPTOmycin (CUBICIN) 650 mg in sodium chloride 0.9 % IVPB 650 mg (03/01/23 1626)   dextrose 50 mL/hr at 03/02/23 0649   PRN Meds:.albuterol, bisacodyl, diazepam, HYDROmorphone (DILAUDID) injection, naLOXone (NARCAN)  injection,  ondansetron (ZOFRAN) IV, oxyCODONE, polyethylene glycol  SUBJECTIVE:   24 hour events:  No acute events noted Seen by nephrology in consultation yesterday due to worsening kidney function Went for IR guided disc space aspiration today Afebrile Renal ultrasound shows no significant abnormalities Blood cultures from admission are no growth Creatinine today is pending Yesterday was 7.8  She reports ongoing severe back pain.  Her husband is at the bedside.  She appears uncomfortable.  She denies fevers.     Review of Systems  All other systems reviewed and are negative.     OBJECTIVE:   Blood pressure (!) 108/58, pulse 70, temperature 98.3 F (36.8 C), temperature source Oral, resp. rate 19, height 4\' 11"  (1.499 m), weight 80.6 kg, SpO2 96 %. Body mass index is 35.89 kg/m.  Physical Exam Constitutional:      General: She is not in acute distress.    Appearance: Normal appearance.  HENT:     Head: Normocephalic and atraumatic.  Eyes:     Extraocular Movements: Extraocular movements intact.     Conjunctiva/sclera: Conjunctivae normal.  Abdominal:     General: There is no distension.     Palpations: Abdomen is soft.     Tenderness: There is no abdominal tenderness.  Musculoskeletal:     Cervical back: Normal range of motion.  Skin:    General: Skin is warm and dry.  Neurological:     General: No focal deficit present.     Mental Status: She is alert. Mental status is at baseline.  Psychiatric:        Mood and Affect: Mood normal.        Behavior: Behavior normal.      Lab Results: Lab Results  Component Value Date   WBC 5.5 03/02/2023   HGB 7.6 (L) 03/02/2023   HCT 25.8 (L) 03/02/2023   MCV 91.5 03/02/2023   PLT 114 (L) 03/02/2023    Lab Results  Component Value Date   NA 132 (L) 03/01/2023   K 4.8 03/01/2023   CO2 15 (L) 03/01/2023   GLUCOSE 59 (L) 03/01/2023   BUN 80 (H) 03/01/2023   CREATININE 7.83 (H) 03/01/2023   CALCIUM 7.3 (L) 03/01/2023    GFRNONAA 5 (L) 03/01/2023   GFRAA 31 (L) 06/16/2020    Lab Results  Component Value Date   ALT 8 02/27/2023   AST 11 (L) 02/27/2023   ALKPHOS 151 (H) 02/27/2023   BILITOT 0.4 02/27/2023       Component Value Date/Time   CRP 1.0 (H) 02/27/2023 0747       Component Value Date/Time   ESRSEDRATE 74 (H) 02/27/2023 0747     I have reviewed the micro and lab results in Epic.  Imaging: US RENAL  Result Date: 03/01/2023 CLINICAL DATA:  Acute kidney injury EXAM: RENAL / URINARY TRACT ULTRASOUND COMPLETE COMPARISON:  CT chest abdomen and pelvis 02/26/2023 FINDINGS: Right Kidney: Renal measurements: 9.6 x 4.2 x 3.6 cm = volume: 75  mL. Echogenicity within normal limits. No mass or hydronephrosis visualized. Left Kidney: Renal measurements: 9.8 by 4.7 x 3.9 cm = volume: 93 mL. Echogenicity within normal limits. No mass or hydronephrosis visualized. Bladder: Not seen. Other: None. IMPRESSION: No significant sonographic abnormality of the kidneys. Electronically Signed   By: Darliss Cheney M.D.   On: 03/01/2023 21:13     Imaging independently reviewed in Epic.    Vedia Coffer for Infectious Disease [name] [phone] pager 03/02/2023, 9:57 AM  I have personally spent 50 minutes involved in face-to-face and non-face-to-face activities for this patient on the day of the visit. Professional time spent includes the following activities: Preparing to see the patient (review of tests), Obtaining and/or reviewing separately obtained history (admission/discharge record), Performing a medically appropriate examination and/or evaluation , Ordering medications/tests/procedures, referring and communicating with other health care professionals, Documenting clinical information in the EMR, Independently interpreting results (not separately reported), Communicating results to the patient/family/caregiver, Counseling and educating the patient/family/caregiver and Care  coordination (not separately reported).

## 2023-03-02 NOTE — Progress Notes (Addendum)
ANTICOAGULATION CONSULT NOTE - Initial Consult  Pharmacy Consult for Resume Eliquis Indication:  history of DVT  Patient Measurements: Height: 4\' 11"  (149.9 cm) Weight: 81.2 kg (179 lb 0.2 oz) (bed) IBW/kg (Calculated) : 43.2   Vital Signs: Temp: 97.6 F (36.4 C) (05/03 1713) Temp Source: Oral (05/03 1713) BP: 121/59 (05/03 1727) Pulse Rate: 66 (05/03 1727)  Labs: Recent Labs    03/01/23 0428 03/01/23 1601 03/02/23 0402 03/02/23 1558  HGB 8.0*  --  7.6* 8.2*  HCT 26.2*  --  25.8* 27.9*  PLT 111*  --  114* 120*  CREATININE 7.62* 7.83* 8.03* 8.07*    Estimated Creatinine Clearance (by C-G formula based on SCr of 8.07 mg/dL (H)) Female: 6.2 mL/min (A) Female: 7.7 mL/min (A)   Medical History: Past Medical History:  Diagnosis Date   Acute MI (HCC) 2007   presented to ED & had cardiac cath- but found to have normal coronaries. Since that point in time her PCP cares f or cardiac needs. Dr. 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    Exertional shortness of breath    Fatty liver    Fibromyalgia    GERD (gastroesophageal reflux disease)    Hepatic steatosis    High cholesterol    History of MI (myocardial infarction) 10/06/2013   Hyperlipidemia 06/03/2008   Qualifier: Diagnosis of  By: Drue Novel MD, Nolon Rod.     Hypertension    MRSA (methicillin resistant Staphylococcus aureus)    Neuropathy    lower legs   Osteoarthritis    hands, hips   Proximal humerus fracture 10/15/2012   Left   PTSD (post-traumatic stress disorder)    Retinopathy 04/17/2019   Sleep apnea 11/16/2017   SOB (shortness of breath) 10/11/2020   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   Qualifier: Diagnosis of  By: Duaine Dredge CMA, Fele[place]     Type 2 diabetes mellitus with hyperglycemia, with long-term current use of insulin (HCC) 04/06/2017    Medications:  Medications Prior to Admission  Medication Sig Dispense Refill Last Dose   acetaminophen (TYLENOL) 325 MG tablet Take 2 tablets (650 mg total) by mouth every 6 (six) hours as needed for mild pain, fever or headache (or Fever >/= 101). Discontinue 500/1000 mg dosing (Patient taking differently: Take 650 mg by mouth every 8 (eight) hours as needed for mild pain, fever or headache (or Fever >/= 101). Discontinue 500/1000 mg dosing) 30 tablet 0 02/25/2023   albuterol (VENTOLIN HFA) 108 (90 Base) MCG/ACT inhaler Inhale 2 puffs into the lungs every 6 (six) hours as needed for wheezing or shortness of breath. 8 g 2 Past Month   apixaban (ELIQUIS) 2.5 MG TABS tablet Take  1 tablet (2.5 mg total) by mouth 2 (two) times daily. 60 tablet 0 02/25/2023 at 17:00   calcium carbonate (TUMS - DOSED IN MG ELEMENTAL CALCIUM) 500 MG chewable tablet Chew 1 tablet by mouth daily.   02/25/2023   carvedilol (COREG) 6.25 MG tablet Take 1 tablet (6.25 mg total) by mouth 2 (two) times daily with a meal. 60 tablet 0 02/25/2023 at 17:00   daptomycin (CUBICIN) IVPB Inject 650 mg into the vein every other day. Indication:  vertebral osteomyelitis First Dose: No Last Day of Therapy:  03/30/2023 Labs - Once weekly:  CBC/D, BMP, and CPK Labs - Every other week:  ESR and CRP Method of administration: IV Push Method of administration may be changed at the discretion of home infusion pharmacist based upon assessment of the patient  and/or caregiver's ability to self-administer the medication ordered. 28 Units 0 UNK   diazepam (VALIUM) 5 MG tablet Take 1 tablet (5 mg total) by mouth every 12 (twelve) hours as needed for anxiety. 30 tablet 0 02/25/2023   DULoxetine (CYMBALTA) 60 MG capsule Take 1 capsule (60 mg total) by mouth daily. 90 capsule 0 02/25/2023   EVENITY 105 MG/1. SOSY injection Inject 210 mg into the skin once.   unknown   ferrous sulfate 325 (65 FE) MG tablet Take 1 tablet (325 mg total) by mouth 2 (two) times daily with a meal. (Patient taking differently: Take 325 mg by mouth every other day.) 60 tablet 0 Past Week   furosemide (LASIX) 20 MG tablet Take 20 mg by mouth 2 (two) times daily.      HYDROcodone-acetaminophen (NORCO) 5-325 MG tablet Take 1 tablet by mouth every 6 (six) hours as needed for moderate pain. 90 tablet 0 02/25/2023   Multiple Vitamin (MULTIVITAMIN WITH MINERALS) TABS tablet Take 1 tablet by mouth daily.   Past Week   ondansetron (ZOFRAN) 8 MG tablet TAKE 1/2 (ONE-HALF) TABLET BY MOUTH EVERY 8 HOURS AS NEEDED FOR NAUSEA FOR VOMITING (Patient taking differently: Take 4 mg by mouth every 8 (eight) hours as needed for nausea or vomiting.) 20 tablet 2 02/25/2023   pregabalin (LYRICA) 75 MG capsule Take 1 capsule (75 mg total) by mouth 3 (three) times daily. (Patient taking differently: Take 75 mg by mouth 2 (two) times daily.) 270 capsule 1 02/25/2023   VITAMIN D PO Take 500 mcg by mouth daily.   02/25/2023   fluticasone-salmeterol (ADVAIR HFA) 115-21 MCG/ACT inhaler Inhale 2 puffs into the lungs 2 (two) times daily. (Patient not taking: Reported on 02/26/2023) 1 each 12 Not Taking   lubiprostone (AMITIZA) 24 MCG capsule Take 1 capsule (24 mcg total) by mouth 2 (two) times daily with a meal. (Patient not taking: Reported on 02/26/2023) 60 capsule 5 Not Taking   sodium bicarbonate 650 MG tablet Take 1 tablet (650 mg total) by mouth 2 (two) times daily. (Patient not taking: Reported on 02/26/2023) 60 tablet  3 Not Taking   tamsulosin (FLOMAX) 0.4 MG CAPS capsule Take 1 capsule (0.4 mg total) by mouth daily. (Patient not taking: Reported on 01/29/2023) 30 capsule 0 Not Taking    Assessment: Pharmacy consulted to restart Eliquis this evening.  MD noted he confirmed with Dr. Lowella Dandy, IR that it is okay to resume eliquis.  S/p IR T4-5 disc aspiration/biopsy on 03/02/23.   Patient was taking eliquis 2.5 mg bid for extended VTE ppx for h/o chronic DVT.   AKI on CKD V.  Patient currently in hemodialysis.  Anemia of CKD. Hgb  8.2,  Hct 27.9, pltc 120K.   Goal of Therapy:   Prevention of recurrent DVTs Monitor platelets by anticoagulation protocol: Yes   Plan:  Resume Eliquis 2.5mg  BID tonight when patient returns from Dialysis. Monitor for signs and symptoms of bleeding.   Thank you for allowing pharmacy to be part of this patients care team. Noah Delaine, RPh Clinical Pharmacist 03/02/2023,5:59 PM

## 2023-03-02 NOTE — Progress Notes (Addendum)
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 Last month, diagnosed with T4-T5 discitis osteomyelitis which was being treated with daptomycin.  Neurosurgery did not recommend any further interventions.  The patient returns to the hospital for worsening back pain for [redacted] week along with an episode of vomiting. CT chest abdomen pelvis: Prior T4-T5 osteomyelitis discitis, difficult to rule out osteomyelitis and T5, continued paraspinal soft tissue density. 4/30: ID consult 5/2: Communicated with neurosurgery (note by Dr. Maisie Fus), recommend continued medical management.  Nephrology consultation for acute renal failure. 5/3: T5 core biopsy and T4-5 disc aspiration by IR.  Left jugular triple-lumen HD catheter placed by IR with plans to start HD.  Subjective:  Seen this morning prior to procedures.  Spouse at bedside.  Patient reports that her pain is better controlled.  Assessment and Plan: Principal Problem:   Acute osteomyelitis/discitis of thoracic spine, rule out abscess- recently grew MRSE -MRI obtained and reveals new T3 marrow edema, persistent T4 and T5 infection, epidural fluid collection increased from prior imaging of about 7 mm-abscess versus phlegmon, paraspinal phlegmon at T4-T5 -CRP 1.0, ESR 74, platelets dropping to 136 from 150s 160s - T5 core biopsy and T4-5 disc aspiration by IR on 5/3.  Follow results.  Follow-up with IR regarding timing of resumption of Eliquis. - ID recommends to continue daptomycin per pharmacy -Discussed with Dr. Maisie Fus, neurosurgery 5/2 and his note  appreciated.  Recommends medical management and patient is at high risk for any surgical intervention. -Multimodality pain control.  Continue scheduled Tylenol, as needed Oxy IR increased from 5 mg to 10 mg every 4 hours as needed for moderate pain and breakthrough pain, continue as needed IV Dilaudid for severe pain, continue Lyrica (hesitant to increase dose given acute on chronic kidney disease), on Cymbalta and added Robaxin for muscle spasms.  Also on lidocaine patch.  Was briefly on Dilaudid PCA on 4/30, unclear why this was stopped.  If pain not controlled on above, may need to consider PCA.  Continuous pulse oximetry.  Continue telemetry monitoring.  Active Problems: Acute kidney injury (oliguric) complicating CKD stage V/non-anion gap metabolic acidosis Patient reports that she follows with Dr. Ronalee Belts, nephrology but had not seen since 06/30/2021.  Baseline creatinine this year has been between 4-5 mostly.  Creatinine has abruptly increased to 7.62 >8.  Foley catheter placed on 5/2 due to urinary retention >500 mL.  AKI possibly multifactorial,?  Hemodynamics/ATN (marked hypertension followed by BP of 99/56 earlier in admission) and may be an element of urinary retention.  Renal ultrasound without hydronephrosis.  Avoid nephrotoxics and hypotension.  400 mL urine output in catheter and 1 unmeasured urine output noted.  Nephrology follow-up appreciated, discussed with Dr. Arrie Aran, plan for HD today and possible repeat HD 5/4.  Sodium bicarbonate increased (serum bicarbonate 13)  HTN, h/o mod LVH grade 1 diastolic dysfunction - cont Coreg with holding parameters- hold Lasix    Hepatic cirrhosis, coagulopathy, mild splenomegaly - INR 1.2- 1.3.  Unclear why INR was checked while patient was on Eliquis PTA.  Post arm pain and upper back pain -  likely musculoskeletal sprain/ spasm- seems that this pain is worse than her mild back pain -  voltaren gel and k pad for now.  Pain management as noted  above.  DM with hypoglycemia: A1c 4.6 on 4/18.  Hypoglycemic range CBGs on 5/2 (60 > 68).  Discontinued SSI.  It appears that her glycemic control has improved, likely related to development of advanced CKD.  She had A1c of up to 8.1 in August 2021.  Ongoing hypoglycemia.  Placed on D10 infusion.  Monitor closely and treat per hypoglycemia protocol.  Wound LLE - follows with wound care as outpt-  -WOC RN input from 5/1 appreciated.  Refer to their note for details.   H/o DVT - on Eliquis, which was held for procedures.  After confirming with Dr. Larena Sox, IR, resumed Eliquis per pharmacy on 5/3.  ? Constipation - occasional- dose not take Amitiza as listed on med rec and is not aware of it.  Last BM 4/27.  Maximize bowel regimen.  Anxiety, pain - Cymbalta, PRN Valium  Body mass index is 35.89 kg/m./Obesity Complicates care.  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  Fluctuating in the 8-9 range.  No overt bleeding reported.  Starting Aranesp per nephrology.  Thrombocytopenia Platelet counts gradually dropping.?  Related to antimicrobials versus acute infection.  Continue to trend daily CBCs.  Platelets up slightly from 114-120.  NOTE: she states she uses PRN O2 at home.  O2 turned off 5/1 and advised to keep it off unless she becomes hypoxic- ? If used for OSA in the past   ACP documents: None present.   Code Status: Full Code Consultants: ID, IR, nephrology DVT prophylaxis: Eliquis held.  SCDs. Family communication: Spouse via phone.    Objective:   Vitals:   03/02/23 1315 03/02/23 1320 03/02/23 1340 03/02/23 1355  BP: 113/70 108/69 123/66 (!) 153/66  Pulse: 70 70 71 74  Resp: 20 18 12 16   Temp:      TempSrc:      SpO2: 100% 99% 90% 96%  Weight:      Height:       Filed Weights   02/27/23 0500 02/28/23 0500 03/01/23 0500   Weight: 79 kg 78.9 kg 80.6 kg   Exam: General exam: Middle-age female, looks older than stated age, moderately built and obese, lying uncomfortably in bed with ongoing pain. Respiratory system: Clear to auscultation. Respiratory effort normal. Cardiovascular system: S1 & S2 heard, RRR. No JVD, murmurs, rubs, gallops or clicks. No pedal edema.  Telemetry personally reviewed: Sinus rhythm. Gastrointestinal system: Abdomen is nondistended, soft and nontender. No organomegaly or masses felt. Normal bowel sounds heard. Central nervous system: Alert and oriented. No focal neurological deficits. Extremities: Symmetric 5 x 5 power. Skin: No rashes, lesions or ulcers Psychiatry: Judgement and insight appear normal. Mood & affect, unable to assess, in pain.       CBC: Recent Labs  Lab 02/26/23 0825 02/27/23 0747 03/01/23 0428 03/02/23 0402 03/02/23 1558  WBC 7.5 8.4 5.8 5.5 5.9  NEUTROABS 6.6  --   --   --   --   HGB 9.2* 9.1* 8.0* 7.6* 8.2*  HCT 30.3* 29.7* 26.2* 25.8* 27.9*  MCV 91.0 89.2 90.7 91.5 93.3  PLT 142* 136* 111* 114* 120*   Basic Metabolic Panel: Recent Labs  Lab 02/27/23 0747 03/01/23 0428  03/01/23 1601 03/02/23 0402 03/02/23 1558  NA 139 133* 132* 134* 133*  K 4.6 4.6 4.8 4.8 5.1  CL 112* 108 105 110 105  CO2 18* 16* 15* 15* 13*  GLUCOSE 130* 69* 59* 59* 84  BUN 50* 80* 80* 84* 83*  CREATININE 4.74* 7.62* 7.83* 8.03* 8.07*  CALCIUM 7.9* 7.4* 7.3* 7.1* 7.2*  PHOS  --   --  6.2* 6.4* 6.5*   GFR: Estimated Creatinine Clearance (by C-G formula based on SCr of 8.07 mg/dL (H)) Female: 6.2 mL/min (A) Female: 7.7 mL/min (A)  Scheduled Meds:  acetaminophen  1,000 mg Oral TID   calcium carbonate  1 tablet Oral Daily   carvedilol  6.25 mg Oral BID WC   Chlorhexidine Gluconate Cloth  6 each Topical Daily   [START ON 03/03/2023] Chlorhexidine Gluconate Cloth  6 each Topical Q0600   darbepoetin (ARANESP) injection - NON-DIALYSIS  40 mcg Subcutaneous Q Fri-1800    diclofenac Sodium  4 g Topical QID   DULoxetine  60 mg Oral Daily   lidocaine  2 patch Transdermal Q24H   lidocaine (PF)  15 mL Intradermal Once   methocarbamol  500 mg Oral TID   mometasone-formoterol  2 puff Inhalation BID   mupirocin ointment  1 Application Nasal BID   polyethylene glycol  17 g Oral Daily   pregabalin  75 mg Oral Daily   senna-docusate  1 tablet Oral BID   sodium bicarbonate  1,300 mg Oral BID   sodium chloride flush  3 mL Intravenous Q12H   Continuous Infusions:  anticoagulant sodium citrate     DAPTOmycin (CUBICIN) 650 mg in sodium chloride 0.9 % IVPB 650 mg (03/01/23 1626)   dextrose 50 mL/hr at 03/02/23 0649   Imaging and lab data was personally reviewed IR THORACIC DISC ASPIRATION W/IMG GUIDE  Result Date: 03/02/2023 INDICATION: 67 year old female with progressive discitis/osteomyelitis despite antibiotic therapy. EXAM: FLUOROSCOPY GUIDED L5 VERTEBRAL BODY CORE BONE BIOPSY AND L4-5 DISC FINE-NEEDLE ASPIRATION. MEDICATIONS: The patient is currently admitted to the hospital and receiving intravenous antibiotics. The antibiotics were administered within an appropriate time frame prior to the initiation of the procedure. ANESTHESIA/SEDATION: Moderate (conscious) sedation was employed during this procedure. A total of Versed 1.5 mg and hydromorphone 1.5 mg were administered intravenously by the radiology nurse. Total intra-service moderate Sedation Time: 30 minutes. The patient's level of consciousness and vital signs were monitored continuously by radiology nursing throughout the procedure under my direct supervision. FLUOROSCOPY DOSE: DAP 10.2 mGym2. COMPLICATIONS: None immediate. PROCEDURE: Informed written consent was obtained from the patient after a thorough discussion of the procedural risks, benefits and alternatives. All questions were addressed. Maximal Sterile Barrier Technique was utilized including caps, mask, sterile gowns, sterile gloves, sterile drape, hand  hygiene and skin antiseptic. A timeout was performed prior to the initiation of the procedure. Under fluoroscopy, the T5 vertebral body was delineated and the skin area was marked. The skin was infiltrated with a 1% Lidocaine approximately 3 cm lateral to the spinous process projection on the left. Using a 22-gauge spinal needle, the soft issue and the peripedicular space and periosteum were infiltrated with Bupivacaine 0.5%. A skin incision was made at the access site. Subsequently, an 11-gauge Kyphon trocar was inserted under fluoroscopic guidance until contact with the pedicle was obtained. The trocar was inserted under light hammer tapping into the pedicle until the posterior boundaries of the vertebral body was reached. The diamond mandrill was removed and one core biopsy was obtained. Then,  a 20 gauge x 20 cm trocar needle was advanced through the endplate into the Z6-1 disc space and 2 fine-needle aspiration sample was collected. The trocar was later removed. The access site was cleaned and covered with a sterile bandage. IMPRESSION: Successful fluoroscopic guided T5 vertebral body core biopsy and T4-5 disc fine-needle aspiration. Core biopsy sample was sent for tissue exam. Fine-needle aspiration samples were sent for Gram stain and culture. Electronically Signed   By: Baldemar Lenis M.D.   On: 03/02/2023 16:23   US RENAL  Result Date: 03/01/2023 CLINICAL DATA:  Acute kidney injury EXAM: RENAL / URINARY TRACT ULTRASOUND COMPLETE COMPARISON:  CT chest abdomen and pelvis 02/26/2023 FINDINGS: Right Kidney: Renal measurements: 9.6 x 4.2 x 3.6 cm = volume: 75 mL. Echogenicity within normal limits. No mass or hydronephrosis visualized. Left Kidney: Renal measurements: 9.8 by 4.7 x 3.9 cm = volume: 93 mL. Echogenicity within normal limits. No mass or hydronephrosis visualized. Bladder: Not seen. Other: None. IMPRESSION: No significant sonographic abnormality of the kidneys. Electronically Signed    By: Darliss Cheney M.D.   On: 03/01/2023 21:13    LOS: 3 days   Marcellus Scott, MD,  FACP, [facility], [facility], [facility], [facility]   [facility] [place]     To contact the attending provider between 7A-7P or the covering provider during after hours 7P-7A, please log into the web site [email or web address] and access using universal [place] password for that web site. If you do not have the password, please call the hospital operator.

## 2023-03-02 NOTE — Procedures (Signed)
INTERVENTIONAL NEURORADIOLOGY BRIEF POSTPROCEDURE NOTE  FLUOROSCOPY GUIDED T5 CORE BIOPSY AND T4-5 DISC ASPIRATION   Attending: Dr. Baldemar Lenis  Diagnosis: T4-5 discitis/osteomyelitis  Access site: Percutaneous   Anesthesia: Moderate sedation  Medication used: 1.5 mg Versed IV; 1 mg Hydromorphone IV.  Complications: None.  Estimated blood loss: None.  Specimen: T5 core biopsy. T4-5 disc aspiration,   Findings: T4-5 endplate erosion. Left transpedicular approach utilized for T5 core biopsy and T4-5 disc aspiration. Core biopsy sample sent for tissue exam. FNA sent for gram stain and culture.  The patient tolerated the procedure well without incident or complication and is in stable condition.

## 2023-03-02 NOTE — Procedures (Deleted)
PROCEDURE SUMMARY:  Successful US guided paracentesis from right latearl abomen.  Yielded 8.5 liters of clear, yellow fluid.  No immediate complications.  Pt tolerated well.   Specimen was sent for labs.  EBL < 5mL  PHC Update:  Patient followed at [facility] for possible transplant.  Hoyt Koch PA-C 03/02/2023 12:20 PM

## 2023-03-02 NOTE — Progress Notes (Addendum)
Admit: 02/26/2023 LOS: 3   [name] [name] is a 67 year old female admitted for continued infectious disease workup in setting of known osteomyelitis and pain management.  Of note, She was recently hospitalized (3/31-02/04/2023) due to T4-T5 discitis/osteomyelitis and was discharged on IV daptomycin through central line.   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  ID is on board recommend continuing IV daptomycin and IR plans to perform T4-5 disc aspiration today.    Pt has CKD V with baseline creatinine ~4.5 and follows with Dr. Ronalee Belts with [facility] (but has not been seen since 06/30/21). On 5/1 pt had no urine output and bladder scan showed 180 cc. Primary team D/C'd home Lasix 20 mg BID and gave 240 mL NS bolus.  Cr increased 4.74 (4/30)>7.62 yesterday.  Of note, CT abdomen pelvis on 4/29 showed normal-appearing kidneys with no hydronephrosis.  Renal US 5/2 showed no abnormalities. She was placed on gentle fluids over night. Foley catheter placed yesterday d/t urinary retention noted on bladder scan with 550 cc returned per RN note.    Subjective:  Patient states she is feeling weaker today than yesterday, she would like to move forward with hemodialysis as kidney function is not improving.   05/02 0701 - 05/03 0700 In: 415.1 [P.O.:270; I.V.:8.3; IV Piggyback:136.8] Out: 400 [Urine:400]  Filed Weights   02/27/23 0500 02/28/23 0500 03/01/23 0500  Weight: 79 kg 78.9 kg 80.6 kg    Scheduled Meds:  acetaminophen  1,000 mg Oral TID   calcium carbonate  1 tablet Oral Daily   carvedilol  6.25 mg Oral BID WC   Chlorhexidine Gluconate Cloth  6 each Topical Daily   diclofenac Sodium  4 g Topical QID   DULoxetine  60 mg Oral Daily   lidocaine  2 patch Transdermal Q24H   methocarbamol  500 mg Oral TID   mometasone-formoterol  2 puff Inhalation BID   mupirocin ointment  1 Application Nasal BID   polyethylene glycol  17 g Oral Daily   pregabalin  75 mg Oral Daily    senna-docusate  1 tablet Oral BID   sodium bicarbonate  650 mg Oral BID   sodium chloride flush  3 mL Intravenous Q12H   Continuous Infusions:  DAPTOmycin (CUBICIN) 650 mg in sodium chloride 0.9 % IVPB 650 mg (03/01/23 1626)   dextrose 50 mL/hr at 03/02/23 0649   PRN Meds:.albuterol, bisacodyl, diazepam, HYDROmorphone (DILAUDID) injection, naLOXone (NARCAN)  injection, ondansetron (ZOFRAN) IV, oxyCODONE, polyethylene glycol  Current Labs: reviewed  RFP: Cr 8.03 BUN 84  CBC: Hgb 7.6  Iron studies:  Iron 70 TIBC 217 Saturation ratio 32%  Physical Exam:  Blood pressure (!) 108/58, pulse 70, temperature 98.3 F (36.8 C), temperature source Oral, resp. rate 19, height 4\' 11"  (1.499 m), weight 80.6 kg, SpO2 96 %.  General: 67 year old female, lying in bed, tired appearing HEENT: White sclera, clear conjunctiva Cardio: RRR, normal S1/S2 Lungs: CTAB, normal effort on room air Abdomen: Bowel sounds present, soft, nontender palpation, non distended Extremities: BLEs Neuro: Alert and oriented, no asterixis present  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 FENa is 2.6% supporting intrinsic AKI, likely ATN. Cr 7.83>8.03 and BUN 80>84.  Discussion had with patient regarding risks and benefits of HD.  Patient would like to proceed with hemodialysis and agrees to have temporary catheter placed today. Metabolic acidosis in setting of CKD V : bicarb stable at 15.  Was started on sodium bicarbonate 650 mg twice daily on 5/1. Would benefit from increase in dose.  Anemia of CKD V: Hgb 8>7.6 today and around baseline (~7.5-9). Iron studies with good saturation ratio and iron WNL.  Diastolic CHF: patient does not appear to be fluid overloaded on exam and has history of HFpEF not HFrEF.  No indication for diuresis at this time.  Will hold home Lasix 20 mg BID.    P AKI on CKD V with decreased urinary output:               -IR has been consulted for temporary dialysis catheter placement today with plans for short session of HD.  Will keep even given soft BP and no evidence of volume overload.  If UOP doesn't pick up, will likely need another session tomorrow.   -Daily RFP Metabolic acidosis: Increase sodium bicarb to 1300 mg twice daily, AM RFP Diastolic CHF: Continue to monitor I/Os and volume status and hold home lasix Anemia: start aransep 40 mcg weekly Osteomyelitis/discitis:  per primary team and ID Other medical conditions: per primary team Medication Issues; Preferred narcotic agents for pain control are hydromorphone, fentanyl, and methadone. Morphine should not be used.  Baclofen should be avoided Avoid oral sodium phosphate and magnesium citrate based laxatives / bowel preps     Recent Labs  Lab 03/01/23 0428 03/01/23 1601 03/02/23 0402  NA 133* 132* 134*  K 4.6 4.8 4.8  CL 108 105 110  CO2 16* 15* 15*  GLUCOSE 69* 59* 59*  BUN 80* 80* 84*  CREATININE 7.62* 7.83* 8.03*  CALCIUM 7.4* 7.3* 7.1*  PHOS  --  6.2* 6.4*   Recent Labs  Lab 02/26/23 0825 02/27/23 0747 03/01/23 0428 03/02/23 0402  WBC 7.5 8.4 5.8 5.5  NEUTROABS 6.6  --   --   --   HGB 9.2* 9.1* 8.0* 7.6*  HCT 30.3* 29.7* 26.2* 25.8*  MCV 91.0 89.2 90.7 91.5  PLT 142* 136* 111* 114*     Erick Alley, DO Family medicine resident, PGY-2   I have seen and examined this patient and agree with plan and assessment in the above note with renal recommendations/intervention highlighted.   Plan for HD today after temp cath placed and likely will need another tomorrow if her UOP does not improve.  Will also start ESA as well.  Her Scr has ranged 4-5 with eGFR <15, so this may be progression to ESRD.  Will see if she recovers any renal function. Jomarie Longs A Canden Cieslinski,MD 03/02/2023 1:01 PM

## 2023-03-02 NOTE — Procedures (Signed)
Interventional Radiology Procedure:   Indications: AKI and request for dialysis catheter  Procedure: Placement of left jugular triple lumen dialysis catheter  Findings: Left jugular 20 cm triple lumen catheter placed, tip at SVC/RA junction.   Complications: None     EBL: Minimal  Plan: Dialysis catheter is ready to use.   Ory Elting R. Lowella Dandy, MD  Pager: [phone]

## 2023-03-03 DIAGNOSIS — M4624 Osteomyelitis of vertebra, thoracic region: Secondary | ICD-10-CM | POA: Diagnosis not present

## 2023-03-03 DIAGNOSIS — N189 Chronic kidney disease, unspecified: Secondary | ICD-10-CM | POA: Diagnosis not present

## 2023-03-03 DIAGNOSIS — N179 Acute kidney failure, unspecified: Secondary | ICD-10-CM | POA: Diagnosis not present

## 2023-03-03 DIAGNOSIS — R338 Other retention of urine: Secondary | ICD-10-CM | POA: Diagnosis not present

## 2023-03-03 LAB — CBC
HCT: 23.8 % — ABNORMAL LOW (ref 36.0–46.0)
Hemoglobin: 7.4 g/dL — ABNORMAL LOW (ref 12.0–15.0)
MCH: 27.9 pg (ref 26.0–34.0)
MCHC: 31.1 g/dL (ref 30.0–36.0)
MCV: 89.8 fL (ref 80.0–100.0)
Platelets: 116 10*3/uL — ABNORMAL LOW (ref 150–400)
RBC: 2.65 MIL/uL — ABNORMAL LOW (ref 3.87–5.11)
RDW: 15.7 % — ABNORMAL HIGH (ref 11.5–15.5)
WBC: 5.3 10*3/uL (ref 4.0–10.5)
nRBC: 0 % (ref 0.0–0.2)

## 2023-03-03 LAB — RENAL FUNCTION PANEL
Albumin: 2.6 g/dL — ABNORMAL LOW (ref 3.5–5.0)
Anion gap: 7 (ref 5–15)
BUN: 48 mg/dL — ABNORMAL HIGH (ref 8–23)
CO2: 23 mmol/L (ref 22–32)
Calcium: 7.3 mg/dL — ABNORMAL LOW (ref 8.9–10.3)
Chloride: 104 mmol/L (ref 98–111)
Creatinine, Ser: 5.32 mg/dL — ABNORMAL HIGH (ref 0.44–1.00)
GFR, Estimated: 8 mL/min — ABNORMAL LOW (ref >60)
Glucose, Bld: 73 mg/dL (ref 70–99)
Phosphorus: 4.5 mg/dL (ref 2.5–4.6)
Potassium: 3.6 mmol/L (ref 3.5–5.1)
Sodium: 134 mmol/L — ABNORMAL LOW (ref 135–145)

## 2023-03-03 LAB — GLUCOSE, CAPILLARY
Glucose-Capillary: 79 mg/dL (ref 70–99)
Glucose-Capillary: 80 mg/dL (ref 70–99)
Glucose-Capillary: 78 mg/dL (ref 70–99)
Glucose-Capillary: 86 mg/dL (ref 70–99)

## 2023-03-03 LAB — CULTURE, BLOOD (ROUTINE X 2): Culture: NO GROWTH

## 2023-03-03 LAB — HEPATITIS B SURFACE ANTIBODY, QUANTITATIVE: Hep B S AB Quant (Post): <3.5 m[IU]/mL — ABNORMAL LOW (ref >9.9)

## 2023-03-03 MED ORDER — HEPARIN SODIUM (PORCINE) 1000 UNIT/ML IJ SOLN
INTRAMUSCULAR | Status: AC
  Administered 2023-03-03: 3200 [IU]
  Filled 2023-03-03: qty 4

## 2023-03-03 NOTE — Progress Notes (Signed)
[name], adult   DOB: 08-03-1956, 67 y.o.   MRN: [id number] S: complaining of severe pain this morning.  Was agitated last night.   O:BP (!) 182/58 (BP Location: Left Arm)   Pulse 88   Temp 98 F (36.7 C) (Oral)   Resp 17   Ht 4\' 11"  (1.499 m)   Wt 80.2 kg   SpO2 100%   BMI 35.71 kg/m   Intake/Output Summary (Last 24 hours) at 03/03/2023 0939 Last data filed at 03/03/2023 0936 Gross per 24 hour  Intake 303.51 ml  Output 1200 ml  Net -896.49 ml   Intake/Output: I/O last 3 completed shifts: In: 568.6 [P.O.:180; I.V.:251.8; IV Piggyback:136.8] Out: 850 [Urine:850]  Intake/Output this shift:  Total I/O In: -  Out: 750 [Urine:750] Weight change:  Gen: mild distress CVS: RRR Resp: CTA  Abd:+BS, soft, NT/ND Ext: no edema  Recent Labs  Lab 02/26/23 0825 02/27/23 0747 03/01/23 0428 03/01/23 1601 03/02/23 0402 03/02/23 1558 03/03/23 0103  NA 139 139 133* 132* 134* 133* 134*  K 4.3 4.6 4.6 4.8 4.8 5.1 3.6  CL 109 112* 108 105 110 105 104  CO2 16* 18* 16* 15* 15* 13* 23  GLUCOSE 209* 130* 69* 59* 59* 84 73  BUN 53* 50* 80* 80* 84* 83* 48*  CREATININE 4.61* 4.74* 7.62* 7.83* 8.03* 8.07* 5.32*  ALBUMIN 3.1* 2.8*  --  2.7* 2.6* 2.8* 2.6*  CALCIUM 8.2* 7.9* 7.4* 7.3* 7.1* 7.2* 7.3*  PHOS  --   --   --  6.2* 6.4* 6.5* 4.5  AST 12* 11*  --   --   --   --   --   ALT 9 8  --   --   --   --   --    Liver Function Tests: Recent Labs  Lab 02/26/23 0825 02/27/23 0747 03/01/23 1601 03/02/23 0402 03/02/23 1558 03/03/23 0103  AST 12* 11*  --   --   --   --   ALT 9 8  --   --   --   --   ALKPHOS 157* 151*  --   --   --   --   BILITOT 0.6 0.4  --   --   --   --   PROT 8.3* 7.9  --   --   --   --   ALBUMIN 3.1* 2.8*   < > 2.6* 2.8* 2.6*   < > = values in this interval not displayed.   Recent Labs  Lab 02/26/23 0825  LIPASE 52*   Recent Labs  Lab 02/26/23 0825  AMMONIA 31   CBC: Recent Labs  Lab 02/26/23 0825 02/27/23 0747 03/01/23 0428  03/02/23 0402 03/02/23 1558 03/03/23 0103  WBC 7.5 8.4 5.8 5.5 5.9 5.3  NEUTROABS 6.6  --   --   --   --   --   HGB 9.2* 9.1* 8.0* 7.6* 8.2* 7.4*  HCT 30.3* 29.7* 26.2* 25.8* 27.9* 23.8*  MCV 91.0 89.2 90.7 91.5 93.3 89.8  PLT 142* 136* 111* 114* 120* 116*   Cardiac Enzymes: Recent Labs  Lab 02/27/23 0747  CKTOTAL 43   CBG: Recent Labs  Lab 03/02/23 0524 03/02/23 0803 03/02/23 1551 03/02/23 2048 03/03/23 0830  GLUCAP 136* 92 71 78 79    Iron Studies:  Recent Labs    03/02/23 0402  IRON 70  TIBC 217*   Studies/Results: IR US Guide Vasc Access Left  Result Date: 03/02/2023  INDICATION: 9 year old with acute kidney injury. History of chronic kidney disease. Request for non tunneled dialysis catheter. EXAM: FLUOROSCOPIC AND ULTRASOUND GUIDED PLACEMENT OF A NON-TUNNELED DIALYSIS CATHETER Physician: Rachelle Hora. Henn, MD MEDICATIONS: 1% lidocaine for local anesthetic ANESTHESIA/SEDATION: None FLUOROSCOPY TIME:  Radiation Exposure Index (as provided by the fluoroscopic device): 35 mGy Kerma COMPLICATIONS: None immediate. PROCEDURE: Informed consent was obtained for catheter placement. The patient was placed supine on the interventional table. Ultrasound confirmed a patent left internal jugular vein. Ultrasound images were obtained for documentation. The left side of the neck was prepped and draped in a sterile fashion. Maximal barrier sterile technique was utilized including caps, mask, sterile gowns, sterile gloves, sterile drape, hand hygiene and skin antiseptic. The left neck was anesthetized with 1% lidocaine. A small incision was made with #11 blade scalpel. A 21 gauge needle directed into the left internal jugular vein with ultrasound guidance. A micropuncture dilator set was placed. A 20 cm Mahurkar catheter was selected. The catheter was advanced over a wire and positioned at the superior cavoatrial junction. Fluoroscopic images were obtained for documentation. Both dialysis lumens  were found to aspirate and flush well. The proper amount of heparin was flushed in both lumens. The central venous lumen was flushed with normal saline. Catheter was sutured to skin. FINDINGS: Catheter tip at the superior cavoatrial junction. IMPRESSION: Successful placement of a left jugular non-tunneled dialysis catheter using ultrasound and fluoroscopic guidance. Electronically Signed   By: Richarda Overlie M.D.   On: 03/02/2023 16:33   IR Fluoro Guide CV Line Left  Result Date: 03/02/2023 INDICATION: 74 year old with acute kidney injury. History of chronic kidney disease. Request for non tunneled dialysis catheter. EXAM: FLUOROSCOPIC AND ULTRASOUND GUIDED PLACEMENT OF A NON-TUNNELED DIALYSIS CATHETER Physician: Rachelle Hora. Henn, MD MEDICATIONS: 1% lidocaine for local anesthetic ANESTHESIA/SEDATION: None FLUOROSCOPY TIME:  Radiation Exposure Index (as provided by the fluoroscopic device): 35 mGy Kerma COMPLICATIONS: None immediate. PROCEDURE: Informed consent was obtained for catheter placement. The patient was placed supine on the interventional table. Ultrasound confirmed a patent left internal jugular vein. Ultrasound images were obtained for documentation. The left side of the neck was prepped and draped in a sterile fashion. Maximal barrier sterile technique was utilized including caps, mask, sterile gowns, sterile gloves, sterile drape, hand hygiene and skin antiseptic. The left neck was anesthetized with 1% lidocaine. A small incision was made with #11 blade scalpel. A 21 gauge needle directed into the left internal jugular vein with ultrasound guidance. A micropuncture dilator set was placed. A 20 cm Mahurkar catheter was selected. The catheter was advanced over a wire and positioned at the superior cavoatrial junction. Fluoroscopic images were obtained for documentation. Both dialysis lumens were found to aspirate and flush well. The proper amount of heparin was flushed in both lumens. The central venous lumen  was flushed with normal saline. Catheter was sutured to skin. FINDINGS: Catheter tip at the superior cavoatrial junction. IMPRESSION: Successful placement of a left jugular non-tunneled dialysis catheter using ultrasound and fluoroscopic guidance. Electronically Signed   By: Richarda Overlie M.D.   On: 03/02/2023 16:33   IR THORACIC DISC ASPIRATION W/IMG GUIDE  Result Date: 03/02/2023 INDICATION: 67 year old female with progressive discitis/osteomyelitis despite antibiotic therapy. EXAM: FLUOROSCOPY GUIDED L5 VERTEBRAL BODY CORE BONE BIOPSY AND L4-5 DISC FINE-NEEDLE ASPIRATION. MEDICATIONS: The patient is currently admitted to the hospital and receiving intravenous antibiotics. The antibiotics were administered within an appropriate time frame prior to the initiation of the procedure. ANESTHESIA/SEDATION: Moderate (conscious) sedation  was employed during this procedure. A total of Versed 1.5 mg and hydromorphone 1.5 mg were administered intravenously by the radiology nurse. Total intra-service moderate Sedation Time: 30 minutes. The patient's level of consciousness and vital signs were monitored continuously by radiology nursing throughout the procedure under my direct supervision. FLUOROSCOPY DOSE: DAP 10.2 mGym2. COMPLICATIONS: None immediate. PROCEDURE: Informed written consent was obtained from the patient after a thorough discussion of the procedural risks, benefits and alternatives. All questions were addressed. Maximal Sterile Barrier Technique was utilized including caps, mask, sterile gowns, sterile gloves, sterile drape, hand hygiene and skin antiseptic. A timeout was performed prior to the initiation of the procedure. Under fluoroscopy, the T5 vertebral body was delineated and the skin area was marked. The skin was infiltrated with a 1% Lidocaine approximately 3 cm lateral to the spinous process projection on the left. Using a 22-gauge spinal needle, the soft issue and the peripedicular space and periosteum  were infiltrated with Bupivacaine 0.5%. A skin incision was made at the access site. Subsequently, an 11-gauge Kyphon trocar was inserted under fluoroscopic guidance until contact with the pedicle was obtained. The trocar was inserted under light hammer tapping into the pedicle until the posterior boundaries of the vertebral body was reached. The diamond mandrill was removed and one core biopsy was obtained. Then, a 20 gauge x 20 cm trocar needle was advanced through the endplate into the Z6-1 disc space and 2 fine-needle aspiration sample was collected. The trocar was later removed. The access site was cleaned and covered with a sterile bandage. IMPRESSION: Successful fluoroscopic guided T5 vertebral body core biopsy and T4-5 disc fine-needle aspiration. Core biopsy sample was sent for tissue exam. Fine-needle aspiration samples were sent for Gram stain and culture. Electronically Signed   By: Baldemar Lenis M.D.   On: 03/02/2023 16:23   US RENAL  Result Date: 03/01/2023 CLINICAL DATA:  Acute kidney injury EXAM: RENAL / URINARY TRACT ULTRASOUND COMPLETE COMPARISON:  CT chest abdomen and pelvis 02/26/2023 FINDINGS: Right Kidney: Renal measurements: 9.6 x 4.2 x 3.6 cm = volume: 75 mL. Echogenicity within normal limits. No mass or hydronephrosis visualized. Left Kidney: Renal measurements: 9.8 by 4.7 x 3.9 cm = volume: 93 mL. Echogenicity within normal limits. No mass or hydronephrosis visualized. Bladder: Not seen. Other: None. IMPRESSION: No significant sonographic abnormality of the kidneys. Electronically Signed   By: Darliss Cheney M.D.   On: 03/01/2023 21:13    acetaminophen  1,000 mg Oral TID   apixaban  2.5 mg Oral BID   calcium carbonate  1 tablet Oral Daily   carvedilol  6.25 mg Oral BID WC   Chlorhexidine Gluconate Cloth  6 each Topical Daily   Chlorhexidine Gluconate Cloth  6 each Topical Q0600   darbepoetin (ARANESP) injection - NON-DIALYSIS  40 mcg Subcutaneous Q Fri-1800    diclofenac Sodium  4 g Topical QID   DULoxetine  60 mg Oral Daily   lidocaine  2 patch Transdermal Q24H   lidocaine (PF)  15 mL Intradermal Once   methocarbamol  500 mg Oral TID   mometasone-formoterol  2 puff Inhalation BID   mupirocin ointment  1 Application Nasal BID   polyethylene glycol  17 g Oral Daily   pregabalin  75 mg Oral Daily   senna-docusate  1 tablet Oral BID   sodium bicarbonate  1,300 mg Oral BID   sodium chloride flush  3 mL Intravenous Q12H    BMET    Component Value Date/Time  NA 134 (L) 03/03/2023 0103   K 3.6 03/03/2023 0103   CL 104 03/03/2023 0103   CO2 23 03/03/2023 0103   GLUCOSE 73 03/03/2023 0103   BUN 48 (H) 03/03/2023 0103   CREATININE 5.32 (H) 03/03/2023 0103   CREATININE 5.69 (H) 02/16/2023 1520   CALCIUM 7.3 (L) 03/03/2023 0103   GFRNONAA 8 (L) 03/03/2023 0103   GFRNONAA 10 (L) 01/12/2023 1419   GFRAA 31 (L) 06/16/2020 1403   CBC    Component Value Date/Time   WBC 5.3 03/03/2023 0103   RBC 2.65 (L) 03/03/2023 0103   HGB 7.4 (L) 03/03/2023 0103   HGB 8.3 (L) 01/12/2023 1419   HGB 15.0 11/30/2008 0927   HCT 23.8 (L) 03/03/2023 0103   HCT 43.8 11/30/2008 0927   PLT 116 (L) 03/03/2023 0103   PLT 120 (L) 01/12/2023 1419   PLT 133 (L) 11/30/2008 0927   MCV 89.8 03/03/2023 0103   MCV 83.9 11/30/2008 0927   MCH 27.9 03/03/2023 0103   MCHC 31.1 03/03/2023 0103   RDW 15.7 (H) 03/03/2023 0103   RDW 13.0 11/30/2008 0927   LYMPHSABS 0.6 (L) 02/26/2023 0825   LYMPHSABS 1.2 11/30/2008 0927   MONOABS 0.2 02/26/2023 0825   MONOABS 0.3 11/30/2008 0927   EOSABS 0.1 02/26/2023 0825   EOSABS 0.1 11/30/2008 0927   BASOSABS 0.0 02/26/2023 0825   BASOSABS 0.0 11/30/2008 0927    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 FENa is 2.6% supporting intrinsic AKI, likely ATN. Cr 7.83>8.03 and BUN 80>84.  Discussion had with patient regarding risks and benefits of HD.   Patient would like to proceed with hemodialysis and agrees to have temporary catheter placed today. Metabolic acidosis in setting of CKD V : bicarb stable at 15.  Was started on sodium bicarbonate 650 mg twice daily on 5/1. Would benefit from increase in dose.  Anemia of CKD V: Hgb 8>7.6 today and around baseline (~7.5-9). Iron studies with good saturation ratio and iron WNL.  Diastolic CHF: patient does not appear to be fluid overloaded on exam and has history of HFpEF not HFrEF.  No indication for diuresis at this time.  Will hold home Lasix 20 mg BID.     P AKI on CKD V with decreased urinary output:              -IR has been consulted for temporary dialysis catheter placement 03/02/23 and first short session of HD after.  No UF given soft BP and no evidence of volume overload.  UOP has increased somewhat but will plan for second session today again without UF.  Hopefully her renal function will improve, however she has advanced CKD at baseline and discussed that she may be ESRD.              -Daily RFP Metabolic acidosis: Increase sodium bicarb to 1300 mg twice daily, AM RFP Diastolic CHF: Continue to monitor I/Os and volume status and hold home lasix Anemia: iron stores replete.  Started aransep 40 mcg weekly Osteomyelitis/discitis:  per primary team and ID Other medical conditions: per primary team Medication Issues; Preferred narcotic agents for pain control are hydromorphone, fentanyl, and methadone. Morphine should not be used.  Baclofen should be avoided Avoid oral sodium phosphate and magnesium citrate based laxatives / bowel preps   Irena Cords, MD [facility]

## 2023-03-03 NOTE — Progress Notes (Signed)
Pt agitated screaming she wants to go home and wants to call her husband. Valium given. Pt's husband called he stated when the Pt gets dilaudid she gets this way after awhile.

## 2023-03-03 NOTE — Progress Notes (Signed)
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 Last month, diagnosed with T4-T5 discitis osteomyelitis which was being treated with daptomycin.  Neurosurgery did not recommend any further interventions.  The patient returns to the hospital for worsening back pain for [redacted] week along with an episode of vomiting. CT chest abdomen pelvis: Prior T4-T5 osteomyelitis discitis, difficult to rule out osteomyelitis and T5, continued paraspinal soft tissue density. 4/30: ID consult 5/2: Communicated with neurosurgery (note by Dr. Maisie Fus), recommend continued medical management.  Nephrology consultation for acute renal failure. 5/3: T5 core biopsy and T4-5 disc aspiration by IR.  Left jugular triple-lumen HD catheter placed by IR with plans to start HD.  Subjective:  Overnight events noted.  As per nursing documentation, patient was agitated.  However as per discussion with patient and spouse at bedside along with the nurse this morning, she reports having severe pain overnight, did not get the assistance she needed from night nursing staff i.e. pain meds.  Spouse also indicated that sometimes she behaves that way when she gets pain medications.  He is planning to stay with her tonight.  Had a lot of moving around yesterday for procedures also.  Assessment and Plan: Principal Problem:   Acute osteomyelitis/discitis of thoracic spine, rule out abscess- recently grew MRSE -MRI obtained and reveals new T3 marrow edema, persistent T4 and T5 infection, epidural fluid collection increased from prior imaging of  about 7 mm-abscess versus phlegmon, paraspinal phlegmon at T4-T5 -CRP 1.0, ESR 74, platelets dropping to 136 from 150s 160s - T5 core biopsy and T4-5 disc aspiration by IR on 5/3.  Follow results.  Follow-up with IR regarding timing of resumption of Eliquis. - ID recommends to continue daptomycin per pharmacy -Discussed with Dr. Maisie Fus, neurosurgery 5/2 and his note appreciated.  Recommends medical management and patient is at high risk for any surgical intervention. -Multimodality pain control.  Continue scheduled Tylenol, as needed Oxy IR increased from 5 mg to 10 mg every 4 hours as needed for moderate pain and breakthrough pain, continue as needed IV Dilaudid for severe pain, continue Lyrica (hesitant to increase dose given acute on chronic kidney disease), on Cymbalta and added Robaxin for muscle spasms.  Also on lidocaine patch.  Was briefly on Dilaudid PCA on 4/30, unclear why this was stopped.  If pain not controlled on above, may need to consider PCA.  Continuous pulse oximetry.  Continue telemetry monitoring.  Continue current regimen and monitor closely for today.  Discussed with RN and advised to make sure that they assessment provide pain medications when appropriate without delays.  Active Problems: Acute kidney injury (oliguric) complicating CKD stage V/non-anion gap metabolic acidosis Patient reports that she follows with Dr. Ronalee Belts, nephrology but had not seen since 06/30/2021.  Baseline creatinine this year has been between 4-5 mostly.  Creatinine has abruptly increased to 7.62 >8.  Foley catheter placed on 5/2 due to urinary retention >500 mL.  AKI possibly multifactorial,?  Hemodynamics/ATN (marked hypertension followed by BP of 99/56 earlier in admission) and may be an element of urinary retention.  Renal ultrasound without hydronephrosis.  Avoid nephrotoxics and hypotension.  400 mL urine output in catheter and 1 unmeasured urine output noted.  Sodium bicarbonate increased (serum  bicarbonate 13).  Nephrology following.  Underwent first HD on 5/3 with plans for second HD on 5/4.  They are concerned that she may have progressed to ESRD and will monitor for renal recovery post today's dialysis.  Urine output improving.  HTN, h/o mod LVH grade 1 diastolic dysfunction - cont Coreg with holding parameters- hold Lasix.  BP is up in the 180-190 systolic range today.  Reassess after meds and dialysis.    Hepatic cirrhosis, coagulopathy, mild splenomegaly  Post arm pain and upper back pain - likely musculoskeletal sprain/ spasm- seems that this pain is worse than her mild back pain -  voltaren gel and k pad for now.  Pain management as noted above.  DM with hypoglycemia: A1c 4.6 on 4/18.  It appears that her glycemic control has improved, likely related to development of advanced CKD.  She had A1c of up to 8.1 in August 2021.  Due to all going recurrent hypoglycemia, SSI stopped, placed on D10 IV infusion with resolution of hypoglycemia.  D10 infusion stopped this morning.  Discussed in detail with nursing to monitor closely for hypoglycemic range CBGs and treat per protocol.  Wound LLE - follows with wound care as outpt-  -WOC RN input from 5/1 appreciated.  Refer to their note for details.   H/o DVT - on Eliquis, which was held for procedures.  After confirming with Dr. Larena Sox, IR, resumed Eliquis per pharmacy on 5/3.  ? Constipation - occasional- dose not take Amitiza as listed on med rec and is not aware of it.  Last BM 5/4.  Maximize bowel regimen.  Anxiety, pain - Cymbalta, PRN Valium  Body mass index is 35.71 kg/m./Obesity Complicates care.  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  Fluctuating in the 7-8 range.  No overt bleeding reported.  Starting Aranesp per nephrology.  Thrombocytopenia Platelet counts gradually  dropping.?  Related to antimicrobials versus acute infection.  Continue to trend daily CBCs.  Platelets up slightly from 114-120-116.  NOTE: she states she uses PRN O2 at home.  Currently on 3 L/min.   ACP documents: None present.   Code Status: Full Code Consultants: ID, IR, nephrology DVT prophylaxis: Eliquis held.  SCDs. apixaban (ELIQUIS) tablet 2.5 mg  Family communication: Spouse at bedside.    Objective:   Vitals:   03/03/23 0500 03/03/23 0530 03/03/23 0825 03/03/23 1210  BP:  (!) 192/80 (!) 182/58 (!) 181/74  Pulse:  90 88 80  Resp:  20 17 17   Temp:  98 F (36.7 C) 98 F (36.7 C) 98.3 F (36.8 C)  TempSrc:  Oral Oral Oral  SpO2:  100% 100% 100%  Weight: 80.2 kg     Height:       Filed Weights   03/02/23 1713 03/02/23 2006 03/03/23 0500  Weight: 81.2 kg 79.2 kg 80.2 kg   Exam: General exam: Middle-age female, looks older than stated age, moderately built and obese, lying uncomfortably in bed with ongoing pain and questionable back spasms.  Appears uncomfortable.  Evaluated along with patient's female RN and spouse at bedside Respiratory system: Clear to auscultation.  No increased work of breathing. Cardiovascular system: S1 & S2 heard, RRR. No JVD, murmurs, rubs, gallops  or clicks. No pedal edema.  Telemetry personally reviewed: Sinus rhythm. Gastrointestinal system: Abdomen is nondistended, soft and nontender. No organomegaly or masses felt. Normal bowel sounds heard. Central nervous system: Alert and oriented. No focal neurological deficits. Extremities: Symmetric 5 x 5 power. Skin: No rashes, lesions or ulcers Psychiatry: Judgement and insight appear normal. Mood & affect, unable to assess, in pain.       CBC: Recent Labs  Lab 02/26/23 0825 02/27/23 0747 03/01/23 0428 03/02/23 0402 03/02/23 1558 03/03/23 0103  WBC 7.5 8.4 5.8 5.5 5.9 5.3  NEUTROABS 6.6  --   --   --   --   --   HGB 9.2* 9.1* 8.0* 7.6* 8.2* 7.4*  HCT 30.3* 29.7* 26.2* 25.8* 27.9*  23.8*  MCV 91.0 89.2 90.7 91.5 93.3 89.8  PLT 142* 136* 111* 114* 120* 116*   Basic Metabolic Panel: Recent Labs  Lab 03/01/23 0428 03/01/23 1601 03/02/23 0402 03/02/23 1558 03/03/23 0103  NA 133* 132* 134* 133* 134*  K 4.6 4.8 4.8 5.1 3.6  CL 108 105 110 105 104  CO2 16* 15* 15* 13* 23  GLUCOSE 69* 59* 59* 84 73  BUN 80* 80* 84* 83* 48*  CREATININE 7.62* 7.83* 8.03* 8.07* 5.32*  CALCIUM 7.4* 7.3* 7.1* 7.2* 7.3*  PHOS  --  6.2* 6.4* 6.5* 4.5   GFR: Estimated Creatinine Clearance (by C-G formula based on SCr of 5.32 mg/dL (H)) Female: 9.4 mL/min (A) Female: 11.6 mL/min (A)  Scheduled Meds:  acetaminophen  1,000 mg Oral TID   apixaban  2.5 mg Oral BID   calcium carbonate  1 tablet Oral Daily   carvedilol  6.25 mg Oral BID WC   Chlorhexidine Gluconate Cloth  6 each Topical Daily   Chlorhexidine Gluconate Cloth  6 each Topical Q0600   darbepoetin (ARANESP) injection - NON-DIALYSIS  40 mcg Subcutaneous Q Fri-1800   diclofenac Sodium  4 g Topical QID   DULoxetine  60 mg Oral Daily   lidocaine  2 patch Transdermal Q24H   lidocaine (PF)  15 mL Intradermal Once   methocarbamol  500 mg Oral TID   mometasone-formoterol  2 puff Inhalation BID   mupirocin ointment  1 Application Nasal BID   polyethylene glycol  17 g Oral Daily   pregabalin  75 mg Oral Daily   senna-docusate  1 tablet Oral BID   sodium bicarbonate  1,300 mg Oral BID   sodium chloride flush  3 mL Intravenous Q12H   Continuous Infusions:  DAPTOmycin (CUBICIN) 650 mg in sodium chloride 0.9 % IVPB 650 mg (03/01/23 1626)   Imaging and lab data was personally reviewed IR US Guide Vasc Access Left  Result Date: 03/02/2023 INDICATION: 14 year old with acute kidney injury. History of chronic kidney disease. Request for non tunneled dialysis catheter. EXAM: FLUOROSCOPIC AND ULTRASOUND GUIDED PLACEMENT OF A NON-TUNNELED DIALYSIS CATHETER Physician: Rachelle Hora. Henn, MD MEDICATIONS: 1% lidocaine for local anesthetic  ANESTHESIA/SEDATION: None FLUOROSCOPY TIME:  Radiation Exposure Index (as provided by the fluoroscopic device): 35 mGy Kerma COMPLICATIONS: None immediate. PROCEDURE: Informed consent was obtained for catheter placement. The patient was placed supine on the interventional table. Ultrasound confirmed a patent left internal jugular vein. Ultrasound images were obtained for documentation. The left side of the neck was prepped and draped in a sterile fashion. Maximal barrier sterile technique was utilized including caps, mask, sterile gowns, sterile gloves, sterile drape, hand hygiene and skin antiseptic. The left neck was anesthetized with 1% lidocaine. A small incision  was made with #11 blade scalpel. A 21 gauge needle directed into the left internal jugular vein with ultrasound guidance. A micropuncture dilator set was placed. A 20 cm Mahurkar catheter was selected. The catheter was advanced over a wire and positioned at the superior cavoatrial junction. Fluoroscopic images were obtained for documentation. Both dialysis lumens were found to aspirate and flush well. The proper amount of heparin was flushed in both lumens. The central venous lumen was flushed with normal saline. Catheter was sutured to skin. FINDINGS: Catheter tip at the superior cavoatrial junction. IMPRESSION: Successful placement of a left jugular non-tunneled dialysis catheter using ultrasound and fluoroscopic guidance. Electronically Signed   By: Richarda Overlie M.D.   On: 03/02/2023 16:33   IR Fluoro Guide CV Line Left  Result Date: 03/02/2023 INDICATION: 59 year old with acute kidney injury. History of chronic kidney disease. Request for non tunneled dialysis catheter. EXAM: FLUOROSCOPIC AND ULTRASOUND GUIDED PLACEMENT OF A NON-TUNNELED DIALYSIS CATHETER Physician: Rachelle Hora. Henn, MD MEDICATIONS: 1% lidocaine for local anesthetic ANESTHESIA/SEDATION: None FLUOROSCOPY TIME:  Radiation Exposure Index (as provided by the fluoroscopic device): 35 mGy  Kerma COMPLICATIONS: None immediate. PROCEDURE: Informed consent was obtained for catheter placement. The patient was placed supine on the interventional table. Ultrasound confirmed a patent left internal jugular vein. Ultrasound images were obtained for documentation. The left side of the neck was prepped and draped in a sterile fashion. Maximal barrier sterile technique was utilized including caps, mask, sterile gowns, sterile gloves, sterile drape, hand hygiene and skin antiseptic. The left neck was anesthetized with 1% lidocaine. A small incision was made with #11 blade scalpel. A 21 gauge needle directed into the left internal jugular vein with ultrasound guidance. A micropuncture dilator set was placed. A 20 cm Mahurkar catheter was selected. The catheter was advanced over a wire and positioned at the superior cavoatrial junction. Fluoroscopic images were obtained for documentation. Both dialysis lumens were found to aspirate and flush well. The proper amount of heparin was flushed in both lumens. The central venous lumen was flushed with normal saline. Catheter was sutured to skin. FINDINGS: Catheter tip at the superior cavoatrial junction. IMPRESSION: Successful placement of a left jugular non-tunneled dialysis catheter using ultrasound and fluoroscopic guidance. Electronically Signed   By: Richarda Overlie M.D.   On: 03/02/2023 16:33   IR THORACIC DISC ASPIRATION W/IMG GUIDE  Result Date: 03/02/2023 INDICATION: 67 year old female with progressive discitis/osteomyelitis despite antibiotic therapy. EXAM: FLUOROSCOPY GUIDED L5 VERTEBRAL BODY CORE BONE BIOPSY AND L4-5 DISC FINE-NEEDLE ASPIRATION. MEDICATIONS: The patient is currently admitted to the hospital and receiving intravenous antibiotics. The antibiotics were administered within an appropriate time frame prior to the initiation of the procedure. ANESTHESIA/SEDATION: Moderate (conscious) sedation was employed during this procedure. A total of Versed 1.5 mg  and hydromorphone 1.5 mg were administered intravenously by the radiology nurse. Total intra-service moderate Sedation Time: 30 minutes. The patient's level of consciousness and vital signs were monitored continuously by radiology nursing throughout the procedure under my direct supervision. FLUOROSCOPY DOSE: DAP 10.2 mGym2. COMPLICATIONS: None immediate. PROCEDURE: Informed written consent was obtained from the patient after a thorough discussion of the procedural risks, benefits and alternatives. All questions were addressed. Maximal Sterile Barrier Technique was utilized including caps, mask, sterile gowns, sterile gloves, sterile drape, hand hygiene and skin antiseptic. A timeout was performed prior to the initiation of the procedure. Under fluoroscopy, the T5 vertebral body was delineated and the skin area was marked. The skin was infiltrated with a 1% Lidocaine  approximately 3 cm lateral to the spinous process projection on the left. Using a 22-gauge spinal needle, the soft issue and the peripedicular space and periosteum were infiltrated with Bupivacaine 0.5%. A skin incision was made at the access site. Subsequently, an 11-gauge Kyphon trocar was inserted under fluoroscopic guidance until contact with the pedicle was obtained. The trocar was inserted under light hammer tapping into the pedicle until the posterior boundaries of the vertebral body was reached. The diamond mandrill was removed and one core biopsy was obtained. Then, a 20 gauge x 20 cm trocar needle was advanced through the endplate into the Z6-1 disc space and 2 fine-needle aspiration sample was collected. The trocar was later removed. The access site was cleaned and covered with a sterile bandage. IMPRESSION: Successful fluoroscopic guided T5 vertebral body core biopsy and T4-5 disc fine-needle aspiration. Core biopsy sample was sent for tissue exam. Fine-needle aspiration samples were sent for Gram stain and culture. Electronically Signed    By: Baldemar Lenis M.D.   On: 03/02/2023 16:23   US RENAL  Result Date: 03/01/2023 CLINICAL DATA:  Acute kidney injury EXAM: RENAL / URINARY TRACT ULTRASOUND COMPLETE COMPARISON:  CT chest abdomen and pelvis 02/26/2023 FINDINGS: Right Kidney: Renal measurements: 9.6 x 4.2 x 3.6 cm = volume: 75 mL. Echogenicity within normal limits. No mass or hydronephrosis visualized. Left Kidney: Renal measurements: 9.8 by 4.7 x 3.9 cm = volume: 93 mL. Echogenicity within normal limits. No mass or hydronephrosis visualized. Bladder: Not seen. Other: None. IMPRESSION: No significant sonographic abnormality of the kidneys. Electronically Signed   By: Darliss Cheney M.D.   On: 03/01/2023 21:13    LOS: 4 days   Marcellus Scott, MD,  FACP, [facility], [facility], [facility], [facility]   [facility] [place]     To contact the attending provider between 7A-7P or the covering provider during after hours 7P-7A, please log into the web site [email or web address] and access using universal [place] password for that web site. If you do not have the password, please call the hospital operator.

## 2023-03-04 DIAGNOSIS — M4624 Osteomyelitis of vertebra, thoracic region: Secondary | ICD-10-CM | POA: Diagnosis not present

## 2023-03-04 LAB — CBC
HCT: 23.3 % — ABNORMAL LOW (ref 36.0–46.0)
Hemoglobin: 7.6 g/dL — ABNORMAL LOW (ref 12.0–15.0)
MCH: 27.9 pg (ref 26.0–34.0)
MCHC: 32.6 g/dL (ref 30.0–36.0)
MCV: 85.7 fL (ref 80.0–100.0)
Platelets: 123 10*3/uL — ABNORMAL LOW (ref 150–400)
RBC: 2.72 MIL/uL — ABNORMAL LOW (ref 3.87–5.11)
RDW: 15.5 % (ref 11.5–15.5)
WBC: 4.5 10*3/uL (ref 4.0–10.5)
nRBC: 0 % (ref 0.0–0.2)

## 2023-03-04 LAB — RENAL FUNCTION PANEL
Albumin: 2.7 g/dL — ABNORMAL LOW (ref 3.5–5.0)
Anion gap: 8 (ref 5–15)
BUN: 22 mg/dL (ref 8–23)
CO2: 25 mmol/L (ref 22–32)
Calcium: 7.6 mg/dL — ABNORMAL LOW (ref 8.9–10.3)
Chloride: 101 mmol/L (ref 98–111)
Creatinine, Ser: 3.26 mg/dL — ABNORMAL HIGH (ref 0.44–1.00)
GFR, Estimated: 15 mL/min — ABNORMAL LOW (ref >60)
Glucose, Bld: 89 mg/dL (ref 70–99)
Phosphorus: 2.8 mg/dL (ref 2.5–4.6)
Potassium: 3.1 mmol/L — ABNORMAL LOW (ref 3.5–5.1)
Sodium: 134 mmol/L — ABNORMAL LOW (ref 135–145)

## 2023-03-04 LAB — GLUCOSE, CAPILLARY
Glucose-Capillary: 97 mg/dL (ref 70–99)
Glucose-Capillary: 81 mg/dL (ref 70–99)
Glucose-Capillary: 86 mg/dL (ref 70–99)
Glucose-Capillary: 82 mg/dL (ref 70–99)

## 2023-03-04 MED ORDER — POTASSIUM CHLORIDE 20 MEQ PO PACK
20.0000 meq | PACK | Freq: Once | ORAL | Status: AC
  Administered 2023-03-04: 20 meq via ORAL
  Filled 2023-03-04: qty 1

## 2023-03-04 MED ORDER — OXYCODONE HCL ER 10 MG PO T12A
10.0000 mg | EXTENDED_RELEASE_TABLET | Freq: Two times a day (BID) | ORAL | Status: DC
  Administered 2023-03-04 – 2023-03-10 (×12): 10 mg via ORAL
  Filled 2023-03-04 (×12): qty 1

## 2023-03-04 MED ORDER — OXYCODONE HCL 5 MG PO TABS
5.0000 mg | ORAL_TABLET | ORAL | Status: DC | PRN
  Administered 2023-03-05: 10 mg via ORAL
  Filled 2023-03-04: qty 2

## 2023-03-04 MED ORDER — HYDRALAZINE HCL 20 MG/ML IJ SOLN
10.0000 mg | Freq: Four times a day (QID) | INTRAMUSCULAR | Status: DC | PRN
  Administered 2023-03-12: 10 mg via INTRAVENOUS
  Filled 2023-03-04: qty 1

## 2023-03-04 NOTE — Progress Notes (Signed)
Patient ID: [name], adult   DOB: [birth year], 67 y.o.   MRN: [id number] S: Feeling a little better today O:BP (!) 193/62 (BP Location: Left Arm)   Pulse 80   Temp 97.9 F (36.6 C) (Oral)   Resp 17   Ht 4\' 11"  (1.499 m)   Wt 80.4 kg   SpO2 96%   BMI 35.80 kg/m   Intake/Output Summary (Last 24 hours) at 03/04/2023 1101 Last data filed at 03/04/2023 0320 Gross per 24 hour  Intake --  Output 900 ml  Net -900 ml   Intake/Output: I/O last 3 completed shifts: In: 423.5 [P.O.:180; I.V.:243.5] Out: 2850 [Urine:2850]  Intake/Output this shift:  No intake/output data recorded. Weight change: -0.8 kg Gen: NAD CVS: RRR Resp: CTA Abd: +BS, soft, NT/ND Ext: no edema  Recent Labs  Lab 02/26/23 0825 02/27/23 0747 03/01/23 0428 03/01/23 1601 03/02/23 0402 03/02/23 1558 03/03/23 0103 03/04/23 0347  NA 139 139 133* 132* 134* 133* 134* 134*  K 4.3 4.6 4.6 4.8 4.8 5.1 3.6 3.1*  CL 109 112* 108 105 110 105 104 101  CO2 16* 18* 16* 15* 15* 13* 23 25  GLUCOSE 209* 130* 69* 59* 59* 84 73 89  BUN 53* 50* 80* 80* 84* 83* 48* 22  CREATININE 4.61* 4.74* 7.62* 7.83* 8.03* 8.07* 5.32* 3.26*  ALBUMIN 3.1* 2.8*  --  2.7* 2.6* 2.8* 2.6* 2.7*  CALCIUM 8.2* 7.9* 7.4* 7.3* 7.1* 7.2* 7.3* 7.6*  PHOS  --   --   --  6.2* 6.4* 6.5* 4.5 2.8  AST 12* 11*  --   --   --   --   --   --   ALT 9 8  --   --   --   --   --   --    Liver Function Tests: Recent Labs  Lab 02/26/23 0825 02/27/23 0747 03/01/23 1601 03/02/23 1558 03/03/23 0103 03/04/23 0347  AST 12* 11*  --   --   --   --   ALT 9 8  --   --   --   --   ALKPHOS 157* 151*  --   --   --   --   BILITOT 0.6 0.4  --   --   --   --   PROT 8.3* 7.9  --   --   --   --   ALBUMIN 3.1* 2.8*   < > 2.8* 2.6* 2.7*   < > = values in this interval not displayed.   Recent Labs  Lab 02/26/23 0825  LIPASE 52*   Recent Labs  Lab 02/26/23 0825  AMMONIA 31   CBC: Recent Labs  Lab 02/26/23 0825 02/27/23 0747 03/01/23 0428 03/02/23 0402  03/02/23 1558 03/03/23 0103 03/04/23 0347  WBC 7.5   < > 5.8 5.5 5.9 5.3 4.5  NEUTROABS 6.6  --   --   --   --   --   --   HGB 9.2*   < > 8.0* 7.6* 8.2* 7.4* 7.6*  HCT 30.3*   < > 26.2* 25.8* 27.9* 23.8* 23.3*  MCV 91.0   < > 90.7 91.5 93.3 89.8 85.7  PLT 142*   < > 111* 114* 120* 116* 123*   < > = values in this interval not displayed.   Cardiac Enzymes: Recent Labs  Lab 02/27/23 0747  CKTOTAL 43   CBG: Recent Labs  Lab 03/03/23 0830 03/03/23 1211 03/03/23 1801 03/03/23 2147 03/04/23 1191  GLUCAP 79 80 78 86 97    Iron Studies:  Recent Labs    03/02/23 0402  IRON 70  TIBC 217*   Studies/Results: IR US Guide Vasc Access Left  Result Date: 03/02/2023 INDICATION: 29 year old with acute kidney injury. History of chronic kidney disease. Request for non tunneled dialysis catheter. EXAM: FLUOROSCOPIC AND ULTRASOUND GUIDED PLACEMENT OF A NON-TUNNELED DIALYSIS CATHETER Physician: Rachelle Hora. Henn, MD MEDICATIONS: 1% lidocaine for local anesthetic ANESTHESIA/SEDATION: None FLUOROSCOPY TIME:  Radiation Exposure Index (as provided by the fluoroscopic device): 35 mGy Kerma COMPLICATIONS: None immediate. PROCEDURE: Informed consent was obtained for catheter placement. The patient was placed supine on the interventional table. Ultrasound confirmed a patent left internal jugular vein. Ultrasound images were obtained for documentation. The left side of the neck was prepped and draped in a sterile fashion. Maximal barrier sterile technique was utilized including caps, mask, sterile gowns, sterile gloves, sterile drape, hand hygiene and skin antiseptic. The left neck was anesthetized with 1% lidocaine. A small incision was made with #11 blade scalpel. A 21 gauge needle directed into the left internal jugular vein with ultrasound guidance. A micropuncture dilator set was placed. A 20 cm Mahurkar catheter was selected. The catheter was advanced over a wire and positioned at the superior cavoatrial  junction. Fluoroscopic images were obtained for documentation. Both dialysis lumens were found to aspirate and flush well. The proper amount of heparin was flushed in both lumens. The central venous lumen was flushed with normal saline. Catheter was sutured to skin. FINDINGS: Catheter tip at the superior cavoatrial junction. IMPRESSION: Successful placement of a left jugular non-tunneled dialysis catheter using ultrasound and fluoroscopic guidance. Electronically Signed   By: Richarda Overlie M.D.   On: 03/02/2023 16:33   IR Fluoro Guide CV Line Left  Result Date: 03/02/2023 INDICATION: 50 year old with acute kidney injury. History of chronic kidney disease. Request for non tunneled dialysis catheter. EXAM: FLUOROSCOPIC AND ULTRASOUND GUIDED PLACEMENT OF A NON-TUNNELED DIALYSIS CATHETER Physician: Rachelle Hora. Henn, MD MEDICATIONS: 1% lidocaine for local anesthetic ANESTHESIA/SEDATION: None FLUOROSCOPY TIME:  Radiation Exposure Index (as provided by the fluoroscopic device): 35 mGy Kerma COMPLICATIONS: None immediate. PROCEDURE: Informed consent was obtained for catheter placement. The patient was placed supine on the interventional table. Ultrasound confirmed a patent left internal jugular vein. Ultrasound images were obtained for documentation. The left side of the neck was prepped and draped in a sterile fashion. Maximal barrier sterile technique was utilized including caps, mask, sterile gowns, sterile gloves, sterile drape, hand hygiene and skin antiseptic. The left neck was anesthetized with 1% lidocaine. A small incision was made with #11 blade scalpel. A 21 gauge needle directed into the left internal jugular vein with ultrasound guidance. A micropuncture dilator set was placed. A 20 cm Mahurkar catheter was selected. The catheter was advanced over a wire and positioned at the superior cavoatrial junction. Fluoroscopic images were obtained for documentation. Both dialysis lumens were found to aspirate and flush  well. The proper amount of heparin was flushed in both lumens. The central venous lumen was flushed with normal saline. Catheter was sutured to skin. FINDINGS: Catheter tip at the superior cavoatrial junction. IMPRESSION: Successful placement of a left jugular non-tunneled dialysis catheter using ultrasound and fluoroscopic guidance. Electronically Signed   By: Richarda Overlie M.D.   On: 03/02/2023 16:33   IR THORACIC DISC ASPIRATION W/IMG GUIDE  Result Date: 03/02/2023 INDICATION: 67 year old female with progressive discitis/osteomyelitis despite antibiotic therapy. EXAM: FLUOROSCOPY GUIDED L5 VERTEBRAL BODY CORE BONE BIOPSY  AND L4-5 DISC FINE-NEEDLE ASPIRATION. MEDICATIONS: The patient is currently admitted to the hospital and receiving intravenous antibiotics. The antibiotics were administered within an appropriate time frame prior to the initiation of the procedure. ANESTHESIA/SEDATION: Moderate (conscious) sedation was employed during this procedure. A total of Versed 1.5 mg and hydromorphone 1.5 mg were administered intravenously by the radiology nurse. Total intra-service moderate Sedation Time: 30 minutes. The patient's level of consciousness and vital signs were monitored continuously by radiology nursing throughout the procedure under my direct supervision. FLUOROSCOPY DOSE: DAP 10.2 mGym2. COMPLICATIONS: None immediate. PROCEDURE: Informed written consent was obtained from the patient after a thorough discussion of the procedural risks, benefits and alternatives. All questions were addressed. Maximal Sterile Barrier Technique was utilized including caps, mask, sterile gowns, sterile gloves, sterile drape, hand hygiene and skin antiseptic. A timeout was performed prior to the initiation of the procedure. Under fluoroscopy, the T5 vertebral body was delineated and the skin area was marked. The skin was infiltrated with a 1% Lidocaine approximately 3 cm lateral to the spinous process projection on the left.  Using a 22-gauge spinal needle, the soft issue and the peripedicular space and periosteum were infiltrated with Bupivacaine 0.5%. A skin incision was made at the access site. Subsequently, an 11-gauge Kyphon trocar was inserted under fluoroscopic guidance until contact with the pedicle was obtained. The trocar was inserted under light hammer tapping into the pedicle until the posterior boundaries of the vertebral body was reached. The diamond mandrill was removed and one core biopsy was obtained. Then, a 20 gauge x 20 cm trocar needle was advanced through the endplate into the U9-8 disc space and 2 fine-needle aspiration sample was collected. The trocar was later removed. The access site was cleaned and covered with a sterile bandage. IMPRESSION: Successful fluoroscopic guided T5 vertebral body core biopsy and T4-5 disc fine-needle aspiration. Core biopsy sample was sent for tissue exam. Fine-needle aspiration samples were sent for Gram stain and culture. Electronically Signed   By: Baldemar Lenis M.D.   On: 03/02/2023 16:23    acetaminophen  1,000 mg Oral TID   apixaban  2.5 mg Oral BID   calcium carbonate  1 tablet Oral Daily   carvedilol  6.25 mg Oral BID WC   Chlorhexidine Gluconate Cloth  6 each Topical Daily   Chlorhexidine Gluconate Cloth  6 each Topical Q0600   darbepoetin (ARANESP) injection - NON-DIALYSIS  40 mcg Subcutaneous Q Fri-1800   diclofenac Sodium  4 g Topical QID   DULoxetine  60 mg Oral Daily   lidocaine  2 patch Transdermal Q24H   methocarbamol  500 mg Oral TID   mometasone-formoterol  2 puff Inhalation BID   mupirocin ointment  1 Application Nasal BID   polyethylene glycol  17 g Oral Daily   pregabalin  75 mg Oral Daily   senna-docusate  1 tablet Oral BID   sodium bicarbonate  1,300 mg Oral BID   sodium chloride flush  3 mL Intravenous Q12H    BMET    Component Value Date/Time   NA 134 (L) 03/04/2023 0347   K 3.1 (L) 03/04/2023 0347   CL 101 03/04/2023  0347   CO2 25 03/04/2023 0347   GLUCOSE 89 03/04/2023 0347   BUN 22 03/04/2023 0347   CREATININE 3.26 (H) 03/04/2023 0347   CREATININE 5.69 (H) 02/16/2023 1520   CALCIUM 7.6 (L) 03/04/2023 0347   GFRNONAA 15 (L) 03/04/2023 0347   GFRNONAA 10 (L) 01/12/2023 1419  GFRAA 31 (L) 06/16/2020 1403   CBC    Component Value Date/Time   WBC 4.5 03/04/2023 0347   RBC 2.72 (L) 03/04/2023 0347   HGB 7.6 (L) 03/04/2023 0347   HGB 8.3 (L) 01/12/2023 1419   HGB 15.0 11/30/2008 0927   HCT 23.3 (L) 03/04/2023 0347   HCT 43.8 11/30/2008 0927   PLT 123 (L) 03/04/2023 0347   PLT 120 (L) 01/12/2023 1419   PLT 133 (L) 11/30/2008 0927   MCV 85.7 03/04/2023 0347   MCV 83.9 11/30/2008 0927   MCH 27.9 03/04/2023 0347   MCHC 32.6 03/04/2023 0347   RDW 15.5 03/04/2023 0347   RDW 13.0 11/30/2008 0927   LYMPHSABS 0.6 (L) 02/26/2023 0825   LYMPHSABS 1.2 11/30/2008 0927   MONOABS 0.2 02/26/2023 0825   MONOABS 0.3 11/30/2008 0927   EOSABS 0.1 02/26/2023 0825   EOSABS 0.1 11/30/2008 0927   BASOSABS 0.0 02/26/2023 0825   BASOSABS 0.0 11/30/2008 0927      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 FENa is 2.6% supporting intrinsic AKI, likely ATN. Cr 7.83>8.03 and BUN 80>84.  Discussion had with patient regarding risks and benefits of HD.  Temp HD cath placed 03/02/23 and had HD on 5/3 and 5/4 without UF and now with markedly improved UOP. Metabolic acidosis in setting of CKD V : bicarb stable at 15.  Was started on sodium bicarbonate 650 mg twice daily on 5/1. Would benefit from increase in dose.  Anemia of CKD V: Hgb 8>7.6 today and around baseline (~7.5-9). Iron studies with good saturation ratio and iron WNL.  Diastolic CHF: patient does not appear to be fluid overloaded on exam and has history of HFpEF not HFrEF.  No indication for diuresis at this time.  Will hold home Lasix 20 mg BID.     P AKI on CKD V with  decreased urinary output:              -IR has been consulted for temporary dialysis catheter placement 03/02/23 and first short session of HD after and again on 03/03/23.  No UF given soft BP and no evidence of volume overload.  UOP has increased significantly.  Will hold off on further HD for now and follow BUN/Cr.   Hopefully her renal function will improve, however she has advanced CKD at baseline and discussed that she may be ESRD.              -Daily RFP Metabolic acidosis: Increase sodium bicarb to 1300 mg twice daily, AM RFP Diastolic CHF: Continue to monitor I/Os and volume status and hold home lasix Anemia: iron stores replete.  Started aransep 40 mcg weekly Osteomyelitis/discitis:  per primary team and ID Hypokalemia - follow and replete gently with KCl today. Other medical conditions: per primary team Medication Issues; Preferred narcotic agents for pain control are hydromorphone, fentanyl, and methadone. Morphine should not be used.  Baclofen should be avoided Avoid oral sodium phosphate and magnesium citrate based laxatives / bowel preps   Irena Cords, MD [facility]

## 2023-03-04 NOTE — Progress Notes (Signed)
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 Last month, diagnosed with T4-T5 discitis osteomyelitis which was being treated with daptomycin.  Neurosurgery did not recommend any further interventions.  The patient returns to the hospital for worsening back pain for [redacted] week along with an episode of vomiting. CT chest abdomen pelvis: Prior T4-T5 osteomyelitis discitis, difficult to rule out osteomyelitis and T5, continued paraspinal soft tissue density. 4/30: ID consult 5/2: Communicated with neurosurgery (note by Dr. Maisie Fus), recommend continued medical management.  Nephrology consultation for acute renal failure. 5/3: T5 core biopsy and T4-5 disc aspiration by IR.  Left jugular triple-lumen HD catheter placed by IR with plans to start HD.  Subjective:  Spouse stayed with patient overnight.  Both expressed dissatisfaction with nursing care (updated floor charge nurse).  Multiple concerns regarding not getting medications on time, timely toileting needs etc.  Patient reports pain is better controlled but has used almost around-the-clock IV and oral opioids.  Spouse has brought in the back brace from home which she plans to use.  Assessment and Plan: Principal Problem:   Acute osteomyelitis/discitis of thoracic spine, rule out abscess- recently grew MRSE -MRI obtained and reveals new T3 marrow edema, persistent T4 and T5 infection, epidural fluid collection increased from prior imaging of about 7 mm-abscess versus phlegmon, paraspinal phlegmon at T4-T5 -CRP 1.0, ESR 74, platelets dropping to  136 from 150s 160s - T5 core biopsy and T4-5 disc aspiration by IR on 5/3.  Follow results.  Follow-up with IR regarding timing of resumption of Eliquis. - ID recommends to continue daptomycin per pharmacy -Discussed with Dr. Maisie Fus, neurosurgery 5/2 and his note appreciated.  Recommends medical management and patient is at high risk for any surgical intervention. -Multimodality pain control.  Continue scheduled Tylenol, as needed Oxy IR increased from 5 mg to 10 mg every 4 hours as needed for moderate pain and breakthrough pain, continue as needed IV Dilaudid for severe pain, continue Lyrica (hesitant to increase dose given acute on chronic kidney disease), on Cymbalta and added Robaxin for muscle spasms.  Also on lidocaine patch.  Was briefly on Dilaudid PCA on 4/30, unclear why this was stopped.   -Will add OxyContin for better control and hopefully can cut back on PRNs.  Active Problems: Acute kidney injury (oliguric) complicating CKD stage V/non-anion gap metabolic acidosis Patient reports that she follows with Dr. Ronalee Belts, nephrology but had not seen since 06/30/2021.  Baseline creatinine this year has been between 4-5 mostly.  Creatinine has abruptly increased to 7.62 >8.  Foley catheter placed on 5/2 due to urinary retention >500 mL.  AKI possibly multifactorial,?  Hemodynamics/ATN (marked hypertension followed by BP of 99/56 earlier in admission) and may be an element of urinary retention.  Renal ultrasound without hydronephrosis.  Avoid nephrotoxics and hypotension.  400 mL urine output in catheter and 1 unmeasured urine output noted.  Sodium bicarbonate increased (serum bicarbonate 13).  Nephrology following.  Underwent first HD on 5/3 with second HD on 5/4.  They are concerned that she may have progressed to ESRD and will monitor for renal recovery  post today's dialysis.   2400 mL urine output yesterday.  Creatinine down to the mid 3 range.  As discussed with Dr. Arrie Aran, acute renal failure  appears to be improving, holding off further dialysis, replace hypokalemia and monitor BMP.  HTN, h/o mod LVH grade 1 diastolic dysfunction - cont Coreg with holding parameters- hold Lasix.  SBP in the 190s.  Added as needed IV hydralazine.  Hepatic cirrhosis, coagulopathy, mild splenomegaly  Post arm pain and upper back pain - likely musculoskeletal sprain/ spasm- seems that this pain is worse than her mild back pain -  voltaren gel and k pad for now.  Pain management as noted above.  DM with hypoglycemia: A1c 4.6 on 4/18.  It appears that her glycemic control has improved, likely related to development of advanced CKD.  She had A1c of up to 8.1 in August 2021.  Due to all going recurrent hypoglycemia, SSI stopped, placed on D10 IV infusion with resolution of hypoglycemia.  Off D10.  No further hypoglycemia.  Wound LLE - follows with wound care as outpt-  -WOC RN input from 5/1 appreciated.  Refer to their note for details.   H/o DVT -Continue Eliquis.  ? Constipation - occasional- dose not take Amitiza as listed on med rec and is not aware of it.  Having BMs.  Maximize bowel regimen.  Anxiety, pain - Cymbalta, PRN Valium  Body mass index is 35.8 kg/m./Obesity Complicates care.  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  Fluctuating in the 7-8 range.  No overt bleeding reported.  Starting Aranesp per nephrology.  Thrombocytopenia Platelet counts gradually dropping.?  Related to antimicrobials versus acute infection.  Continue to trend daily CBCs.  Stable.  NOTE: she states she uses PRN O2 at home.  Currently on 3 L/min.   ACP documents: None present.   Code Status: Full Code Consultants: ID, IR, nephrology DVT prophylaxis: Eliquis held.  SCDs. apixaban (ELIQUIS) tablet 2.5 mg  Family communication: Spouse at bedside  Objective:    Vitals:   03/03/23 2148 03/04/23 0312 03/04/23 0823 03/04/23 0856  BP: (!) 158/80 (!) 193/84 (!) 193/62   Pulse: 80 81 80   Resp: 19 17 17    Temp: 98 F (36.7 C) 98.2 F (36.8 C) 97.9 F (36.6 C)   TempSrc: Oral Oral Oral   SpO2: 94% 93% 95% 96%  Weight:      Height:       Filed Weights   03/02/23 2006 03/03/23 0500 03/03/23 1714  Weight: 79.2 kg 80.2 kg 80.4 kg   Exam: General exam: Middle-age female, looks older than stated age, moderately built and obese, lying comfortably supine in bed without distress. Respiratory system: Clear to auscultation.  No increased work of breathing. Cardiovascular system: S1 & S2 heard, RRR. No JVD, murmurs, rubs, gallops or clicks. No pedal edema.   Gastrointestinal system: Abdomen is nondistended, soft and nontender. No organomegaly or masses felt. Normal bowel sounds heard. Central nervous system: Alert and oriented. No focal neurological deficits. Extremities: Symmetric 5 x 5 power. Skin: No rashes, lesions or ulcers Psychiatry: Judgement and insight appear normal. Mood & affect, unable to assess, in pain.       CBC: Recent Labs  Lab 02/26/23 0825 02/27/23 0747 03/01/23 0428 03/02/23 0402 03/02/23 1558 03/03/23 0103 03/04/23 0347  WBC 7.5   < >  5.8 5.5 5.9 5.3 4.5  NEUTROABS 6.6  --   --   --   --   --   --   HGB 9.2*   < > 8.0* 7.6* 8.2* 7.4* 7.6*  HCT 30.3*   < > 26.2* 25.8* 27.9* 23.8* 23.3*  MCV 91.0   < > 90.7 91.5 93.3 89.8 85.7  PLT 142*   < > 111* 114* 120* 116* 123*   < > = values in this interval not displayed.   Basic Metabolic Panel: Recent Labs  Lab 03/01/23 1601 03/02/23 0402 03/02/23 1558 03/03/23 0103 03/04/23 0347  NA 132* 134* 133* 134* 134*  K 4.8 4.8 5.1 3.6 3.1*  CL 105 110 105 104 101  CO2 15* 15* 13* 23 25  GLUCOSE 59* 59* 84 73 89  BUN 80* 84* 83* 48* 22  CREATININE 7.83* 8.03* 8.07* 5.32* 3.26*  CALCIUM 7.3* 7.1* 7.2* 7.3* 7.6*  PHOS 6.2* 6.4* 6.5* 4.5 2.8   GFR: Estimated Creatinine  Clearance (by C-G formula based on SCr of 3.26 mg/dL (H)) Female: 24.4 mL/min (A) Female: 18.9 mL/min (A)  Scheduled Meds:  acetaminophen  1,000 mg Oral TID   apixaban  2.5 mg Oral BID   calcium carbonate  1 tablet Oral Daily   carvedilol  6.25 mg Oral BID WC   Chlorhexidine Gluconate Cloth  6 each Topical Daily   Chlorhexidine Gluconate Cloth  6 each Topical Q0600   darbepoetin (ARANESP) injection - NON-DIALYSIS  40 mcg Subcutaneous Q Fri-1800   diclofenac Sodium  4 g Topical QID   DULoxetine  60 mg Oral Daily   lidocaine  2 patch Transdermal Q24H   methocarbamol  500 mg Oral TID   mometasone-formoterol  2 puff Inhalation BID   mupirocin ointment  1 Application Nasal BID   polyethylene glycol  17 g Oral Daily   pregabalin  75 mg Oral Daily   senna-docusate  1 tablet Oral BID   sodium bicarbonate  1,300 mg Oral BID   sodium chloride flush  3 mL Intravenous Q12H   Continuous Infusions:  DAPTOmycin (CUBICIN) 650 mg in sodium chloride 0.9 % IVPB 650 mg (03/03/23 1700)   Imaging and lab data was personally reviewed No results found.  LOS: 5 days   Marcellus Scott, MD,  FACP, [facility], [facility], [facility], [facility]   [facility] [place]     To contact the attending provider between 7A-7P or the covering provider during after hours 7P-7A, please log into the web site [email or web address] and access using universal Gu[employer] password for that web site. If you do not have the password, please call the hospital operator.

## 2023-03-05 DIAGNOSIS — E876 Hypokalemia: Secondary | ICD-10-CM

## 2023-03-05 DIAGNOSIS — M4644 Discitis, unspecified, thoracic region: Secondary | ICD-10-CM | POA: Diagnosis not present

## 2023-03-05 DIAGNOSIS — N179 Acute kidney failure, unspecified: Secondary | ICD-10-CM | POA: Diagnosis not present

## 2023-03-05 DIAGNOSIS — E1169 Type 2 diabetes mellitus with other specified complication: Secondary | ICD-10-CM

## 2023-03-05 DIAGNOSIS — M4624 Osteomyelitis of vertebra, thoracic region: Secondary | ICD-10-CM | POA: Diagnosis not present

## 2023-03-05 LAB — GLUCOSE, CAPILLARY
Glucose-Capillary: 68 mg/dL — ABNORMAL LOW (ref 70–99)
Glucose-Capillary: 113 mg/dL — ABNORMAL HIGH (ref 70–99)
Glucose-Capillary: 100 mg/dL — ABNORMAL HIGH (ref 70–99)
Glucose-Capillary: 110 mg/dL — ABNORMAL HIGH (ref 70–99)
Glucose-Capillary: 139 mg/dL — ABNORMAL HIGH (ref 70–99)

## 2023-03-05 LAB — RENAL FUNCTION PANEL
Albumin: 2.5 g/dL — ABNORMAL LOW (ref 3.5–5.0)
Anion gap: 10 (ref 5–15)
BUN: 22 mg/dL (ref 8–23)
CO2: 26 mmol/L (ref 22–32)
Calcium: 7.5 mg/dL — ABNORMAL LOW (ref 8.9–10.3)
Chloride: 100 mmol/L (ref 98–111)
Creatinine, Ser: 3.62 mg/dL — ABNORMAL HIGH (ref 0.44–1.00)
GFR, Estimated: 13 mL/min — ABNORMAL LOW (ref >60)
Glucose, Bld: 75 mg/dL (ref 70–99)
Phosphorus: 3.4 mg/dL (ref 2.5–4.6)
Potassium: 3 mmol/L — ABNORMAL LOW (ref 3.5–5.1)
Sodium: 136 mmol/L (ref 135–145)

## 2023-03-05 LAB — CBC
HCT: 23.5 % — ABNORMAL LOW (ref 36.0–46.0)
Hemoglobin: 7.2 g/dL — ABNORMAL LOW (ref 12.0–15.0)
MCH: 27.6 pg (ref 26.0–34.0)
MCHC: 30.6 g/dL (ref 30.0–36.0)
MCV: 90 fL (ref 80.0–100.0)
Platelets: 121 10*3/uL — ABNORMAL LOW (ref 150–400)
RBC: 2.61 MIL/uL — ABNORMAL LOW (ref 3.87–5.11)
RDW: 15.9 % — ABNORMAL HIGH (ref 11.5–15.5)
WBC: 4.7 10*3/uL (ref 4.0–10.5)
nRBC: 0 % (ref 0.0–0.2)

## 2023-03-05 LAB — SURGICAL PATHOLOGY

## 2023-03-05 MED ORDER — OXYCODONE HCL 5 MG PO TABS: 5.0000 mg | ORAL_TABLET | ORAL | Status: DC | PRN

## 2023-03-05 MED ORDER — HYDROMORPHONE HCL 1 MG/ML IJ SOLN
0.5000 mg | INTRAMUSCULAR | Status: DC | PRN
  Administered 2023-03-05 – 2023-03-07 (×3): 1 mg via INTRAVENOUS
  Filled 2023-03-05 (×3): qty 1

## 2023-03-05 MED ORDER — POTASSIUM CHLORIDE 20 MEQ PO PACK
40.0000 meq | PACK | Freq: Once | ORAL | Status: AC
  Administered 2023-03-05: 40 meq via ORAL
  Filled 2023-03-05: qty 2

## 2023-03-05 MED ORDER — OXYCODONE HCL 5 MG PO TABS
5.0000 mg | ORAL_TABLET | Freq: Four times a day (QID) | ORAL | Status: DC | PRN
  Administered 2023-03-05 – 2023-03-09 (×8): 10 mg via ORAL
  Filled 2023-03-05 (×9): qty 2

## 2023-03-05 MED ORDER — HYDROMORPHONE HCL 1 MG/ML IJ SOLN: 0.5000 mg | INTRAMUSCULAR | Status: DC | PRN

## 2023-03-05 MED ORDER — SODIUM CHLORIDE 0.9% FLUSH
10.0000 mL | Freq: Two times a day (BID) | INTRAVENOUS | Status: DC
  Administered 2023-03-05 – 2023-04-05 (×39): 10 mL

## 2023-03-05 NOTE — TOC Progression Note (Signed)
Transition of Care [facility]) - Progression Note    Patient Details  Name: [name] [name] MRN: [id number] Date of Birth: Jan 05, 1956  [facility] of [facility]) CM/SW Contact  Huston Foley Jacklynn Ganong, RN Phone Number: 03/05/2023, 12:40 PM  Clinical Narrative:     [facility] Team will continue to follow for needs.   Expected Discharge Plan: Home w Home Health Services Barriers to Discharge: Continued Medical Work up  Expected Discharge Plan and Services   Discharge Planning Services: CM Consult Post Acute Care Choice: Home Health Living arrangements for the past 2 months: Apartment                 DME Arranged: N/A                     Social Determinants of Health (SDOH) Interventions SDOH Screenings   Food Insecurity: No Food Insecurity (02/26/2023)  Housing: Low Risk  (02/26/2023)  Transportation Needs: No Transportation Needs (02/26/2023)  Utilities: Not At Risk (02/26/2023)  Alcohol Screen: Low Risk  (12/30/2021)  Depression (PHQ2-9): Low Risk  (08/23/2022)  Financial Resource Strain: Low Risk  (12/30/2021)  Physical Activity: Inactive (12/30/2021)  Social Connections: Socially Integrated (12/30/2021)  Stress: Stress Concern Present (12/30/2021)  Tobacco Use: Low Risk  (03/02/2023)    Readmission Risk Interventions    06/23/2022    2:13 PM  Readmission Risk Prevention Plan  Transportation Screening Complete  PCP or Specialist Appt within 3-5 Days Complete  HRI or Home Care Consult Complete  Social Work Consult for Recovery Care Planning/Counseling Complete  Palliative Care Screening Not Applicable  Medication Review Oceanographer) Complete

## 2023-03-05 NOTE — Evaluation (Signed)
Occupational Therapy Evaluation Patient Details Name: [name] [name] MRN: [id number] DOB: 1955/12/14 Today's Date: 03/05/2023   History of Present Illness 67 year old who is moslty wheelchair bound and has CKD stage V, hypertension, cirrhosis of the liver, OSA, morbid obesity, L1 osteomyelitis status post L1 corpectomy, T10-T11 discitis osteomyelitis status post posterior arthrodesis of T8-T11 and removal of T11 pedicle screws and rod in 12/23.  Last month, diagnosed with T4-T5 discitis osteomyelitis which was being treated with daptomycin.  Neurosurgery did not recommend any further interventions.     The patient returns to the hospital for worsening back pain for [redacted] week along with an episode of vomiting.   Clinical Impression   Patient admitted for the diagnosis above.  PTA she lives at home with her spouse, who assists with iADL, mobility, while claims she continues to perform her own ADL.  Patient uses RW for short distances, but is mainly in a wheelchair for mobility.  Pain continues to be a deficit, the patient is needing up to Min A for basic mobility and Mod A for lower body ADL.  OT is indicated in the acute setting to address deficits, and the patient is planning on returning hoe with prior level of supports.  As long as the spouse can provide the needed 24 hour assist, home is a good option.  No post acute OT is anticipated.        Recommendations for follow up therapy are one component of a multi-disciplinary discharge planning process, led by the attending physician.  Recommendations may be updated based on patient status, additional functional criteria and insurance authorization.   Assistance Recommended at Discharge Frequent or constant Supervision/Assistance  Patient can return home with the following Help with stairs or ramp for entrance;Assist for transportation;Assistance with cooking/housework;A lot of help with bathing/dressing/bathroom;A lot of help with walking and/or  transfers    Functional Status Assessment  Patient has had a recent decline in their functional status and demonstrates the ability to make significant improvements in function in a reasonable and predictable amount of time.  Equipment Recommendations  None recommended by OT    Recommendations for Other Services       Precautions / Restrictions Precautions Precautions: Back Precaution Booklet Issued: No Required Braces or Orthoses: Spinal Brace Spinal Brace: Thoracolumbosacral orthotic Restrictions Weight Bearing Restrictions: No      Mobility Bed Mobility Overal bed mobility: Needs Assistance Bed Mobility: Rolling, Sidelying to Sit Rolling: Supervision Sidelying to sit: Min assist            Transfers Overall transfer level: Needs assistance Equipment used: Rolling walker (2 wheels) Transfers: Sit to/from Stand, Bed to chair/wheelchair/BSC Sit to Stand: Min assist Stand pivot transfers: Min assist                Balance Overall balance assessment: Needs assistance Sitting-balance support: Feet supported Sitting balance-Leahy Scale: Good     Standing balance support: Reliant on assistive device for balance Standing balance-Leahy Scale: Poor                             ADL either performed or assessed with clinical judgement   ADL       Grooming: Wash/dry hands;Wash/dry face;Set up;Bed level           Upper Body Dressing : Moderate assistance;Sitting   Lower Body Dressing: Moderate assistance;Sitting/lateral leans   Toilet Transfer: Minimal assistance;Stand-pivot;Rolling walker (2 wheels);BSC/3in1  Vision Baseline Vision/History: 2 Legally blind       Perception     Praxis      Pertinent Vitals/Pain Pain Assessment Pain Assessment: Faces Faces Pain Scale: Hurts whole lot Pain Descriptors / Indicators: Grimacing, Guarding Pain Intervention(s): Monitored during session     Hand Dominance Left    Extremity/Trunk Assessment Upper Extremity Assessment Upper Extremity Assessment: Generalized weakness   Lower Extremity Assessment Lower Extremity Assessment: Defer to PT evaluation   Cervical / Trunk Assessment Cervical / Trunk Assessment: Kyphotic   Communication Communication Communication: No difficulties   Cognition Arousal/Alertness: Awake/alert Behavior During Therapy: Anxious Overall Cognitive Status: Within Functional Limits for tasks assessed                                       General Comments       Exercises     Shoulder Instructions      Home Living Family/patient expects to be discharged to:: Private residence Living Arrangements: Spouse/significant other Available Help at Discharge: Family;Available PRN/intermittently Type of Home: House Home Access: Ramped entrance     Home Layout: One level     Bathroom Shower/Tub: Producer, television/film/video: Handicapped height Bathroom Accessibility: Yes How Accessible: Accessible via wheelchair Home Equipment: Wheelchair - Biomedical scientist (2 wheels)   Additional Comments: husband helps her walk      Prior Functioning/Environment Prior Level of Function : Independent/Modified Independent;Needs assist             Mobility Comments: no driving, pt's husband assists with gait as needed . Pt ambulatory for short distances with RW, otherwise uses w/c ADLs Comments: able to dress and bathe self, husband cooks/cleans        OT Problem List: Decreased range of motion;Decreased activity tolerance;Impaired balance (sitting and/or standing);Decreased safety awareness;Pain      OT Treatment/Interventions: Self-care/ADL training;Therapeutic activities;Patient/family education;DME and/or AE instruction;Balance training    OT Goals(Current goals can be found in the care plan section) Acute Rehab OT Goals Patient Stated Goal: return home OT Goal Formulation: With  patient Time For Goal Achievement: 03/19/23 Potential to Achieve Goals: Good ADL Goals Pt Will Perform Grooming: with set-up;sitting Pt Will Transfer to Toilet: with supervision;regular height toilet;stand pivot transfer;bedside commode  OT Frequency: Min 2X/week    Co-evaluation              AM-PAC OT "6 Clicks" Daily Activity     Outcome Measure Help from another person eating meals?: None Help from another person taking care of personal grooming?: None Help from another person toileting, which includes using toliet, bedpan, or urinal?: A Lot Help from another person bathing (including washing, rinsing, drying)?: A Lot Help from another person to put on and taking off regular upper body clothing?: A Lot Help from another person to put on and taking off regular lower body clothing?: A Lot 6 Click Score: 16   End of Session Equipment Utilized During Treatment: Gait belt;Rolling walker (2 wheels);Back brace Nurse Communication: Mobility status  Activity Tolerance: Patient limited by pain Patient left: in chair;with call bell/phone within reach;with family/visitor present  OT Visit Diagnosis: Unsteadiness on feet (R26.81)                Time: 1430-1500 OT Time Calculation (min): 30 min Charges:  OT General Charges $OT Visit: 1 Visit OT Evaluation $OT Eval Moderate Complexity: 1 Mod OT  Treatments $Self Care/Home Management : 8-22 mins  03/05/2023  RP, OTR/L  Acute Rehabilitation Services  Office:  [phone]   Suzanna Obey 03/05/2023, 3:09 PM

## 2023-03-05 NOTE — Progress Notes (Signed)
Admit: 02/26/2023 LOS: 6  [name] [name] is a 67 year old female hospitalized for T spine abscess in setting of known osteomyelitis being treated with IV daptomycin. Pt has CKD V with baseline creatinine ~4.5 and follows with Dr. Ronalee [name] with [facility] but not seen since 06/2021. D/t intrinsic AKI, likely ATN, has started HD.  Subjective:  Patient states she is continuing to feel much better each day  05/05 0701 - 05/06 0700 In: 360 [P.O.:360] Out: 900 [Urine:900]  Filed Weights   03/03/23 0500 03/03/23 1714 03/05/23 0500  Weight: 80.2 kg 80.4 kg 80.2 kg    Scheduled Meds:  acetaminophen  1,000 mg Oral TID   apixaban  2.5 mg Oral BID   calcium carbonate  1 tablet Oral Daily   carvedilol  6.25 mg Oral BID WC   Chlorhexidine Gluconate Cloth  6 each Topical Daily   Chlorhexidine Gluconate Cloth  6 each Topical Q0600   darbepoetin (ARANESP) injection - NON-DIALYSIS  40 mcg Subcutaneous Q Fri-1800   diclofenac Sodium  4 g Topical QID   DULoxetine  60 mg Oral Daily   lidocaine  2 patch Transdermal Q24H   methocarbamol  500 mg Oral TID   mometasone-formoterol  2 puff Inhalation BID   mupirocin ointment  1 Application Nasal BID   oxyCODONE  10 mg Oral Q12H   polyethylene glycol  17 g Oral Daily   pregabalin  75 mg Oral Daily   senna-docusate  1 tablet Oral BID   sodium chloride flush  3 mL Intravenous Q12H   Continuous Infusions:  DAPTOmycin (CUBICIN) 650 mg in sodium chloride 0.9 % IVPB 650 mg (03/03/23 1700)   PRN Meds:.albuterol, bisacodyl, diazepam, hydrALAZINE, HYDROmorphone (DILAUDID) injection, naLOXone (NARCAN)  injection, ondansetron (ZOFRAN) IV, oxyCODONE, polyethylene glycol  Current Labs: reviewed    Physical Exam:  Blood pressure (!) 175/76, pulse 72, temperature 97.8 F (36.6 C), temperature source Oral, resp. rate 18, height 4\' 11"  (1.499 m), weight 80.2 kg, SpO2 100 %. General: 67 year old female, sitting up in bed, NAD Cardio: RRR, normal S1/S2 Lungs:  CTAB, normal effort Abdomen: Soft, nontender palpation, nondistended Extremities: No edema BLEs  Neuro: Alert, no focal deficits  A AKI on CKD V with decreased urinary output: FENa of 2.6% supporting intrinsic AKI, likely ATN. Cr peaked at 8.03.  Patient had temporary HD catheter placed 03/02/2023 with HD on 5/3 and 5/4. Cr has down trended, 3.62 today. 24 hr UOP 900 ml Metabolic acidosis in setting of CKD V : Was on serum bicarbonate 1300 mg twice daily, bicarb improved to 26 today. Anemia of CKD V: Hgb 8>7.6 today and around baseline (~7.5-9). Iron studies with good saturation ratio and iron WNL.  Hypokalemia.  K of 3 today Diastolic CHF: patient does not appear to be fluid overloaded on exam and has history of HFpEF not HFrEF.  No indication for diuresis at this time.  Will hold home Lasix 20 mg BID.    P AKI on CKD V with decreased urinary output:              -Hold off on further HD today. Will continue to monitor kidney function and urinary output and assess need for HD vs catheter removal tomorrow             -Daily RFP Metabolic acidosis:  Na bicarb d/c'd, will CTM with AM RFP Diastolic CHF: Continue to monitor I/Os and volume status and hold home lasix Hypokalemia: K repleted today Anemia: start aransep 40 mcg weekly  Osteomyelitis/discitis:  per primary team and ID Other medical conditions: per primary team Medication Issues; Preferred narcotic agents for pain control are hydromorphone, fentanyl, and methadone. Morphine should not be used.  Baclofen should be avoided Avoid oral sodium phosphate and magnesium citrate based laxatives / bowel preps   [name] Alley, DO Family medicine, PGY-2  Recent Labs  Lab 03/03/23 0103 03/04/23 0347 03/05/23 0407  NA 134* 134* 136  K 3.6 3.1* 3.0*  CL 104 101 100  CO2 23 25 26   GLUCOSE 73 89 75  BUN 48* 22 22  CREATININE 5.32* 3.26* 3.62*  CALCIUM 7.3* 7.6* 7.5*  PHOS 4.5 2.8 3.4   Recent Labs  Lab 03/03/23 0103 03/04/23 0347  03/05/23 0407  WBC 5.3 4.5 4.7  HGB 7.4* 7.6* 7.2*  HCT 23.8* 23.3* 23.5*  MCV 89.8 85.7 90.0  PLT 116* 123* 121*

## 2023-03-05 NOTE — Progress Notes (Signed)
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 Last month, diagnosed with T4-T5 discitis osteomyelitis which was being treated with daptomycin.  Neurosurgery did not recommend any further interventions.  The patient returns to the hospital for worsening back pain for [redacted] week along with an episode of vomiting. CT chest abdomen pelvis: Prior T4-T5 osteomyelitis discitis, difficult to rule out osteomyelitis and T5, continued paraspinal soft tissue density. 4/30: ID consult 5/2: Communicated with neurosurgery (note by Dr. Maisie Fus), recommend continued medical management.  Nephrology consultation for acute renal failure. 5/3: T5 core biopsy and T4-5 disc aspiration by IR.  Left jugular triple-lumen HD catheter placed by IR, underwent HD x 2 on 5/3 and 5/4  Subjective:  Pain is better controlled as verified with patient, rates it a 6/10.  As per nursing, and MAR review, required less IV/p.o. as needed opioids overnight since initiation of OxyContin.  Assessment and Plan: Principal Problem:   Acute osteomyelitis/discitis of thoracic spine, rule out abscess- recently grew MRSE -MRI obtained and reveals new T3 marrow edema, persistent T4 and T5 infection, epidural fluid collection increased from prior imaging of about 7 mm-abscess versus phlegmon, paraspinal phlegmon at T4-T5 -CRP 1.0, ESR 74, platelets dropping to 136 from 150s 160s - T5 core biopsy and T4-5 disc aspiration by IR on 5/3.  Follow results.  Follow-up with IR regarding timing of resumption of Eliquis. - ID recommends to continue  daptomycin per pharmacy -Discussed with Dr. Maisie Fus, neurosurgery 5/2 and his note appreciated.  Recommends medical management and patient is at high risk for any surgical intervention. -Multimodality pain control.  scheduled Tylenol, as needed Oxy IR, as needed IV Dilaudid, Lyrica, Cymbalta, Robaxin, lidocaine patch and added OxyContin 10 mg twice daily since 5/5.   -Fine-needle aspirate T4-T5 disc 5/3: No organisms seen on Gram stain and NTD cultures. -T5 bone biopsy: Chronic osteomyelitis with marked reactive bone changes.  Negative for malignancy.  Active Problems: Acute kidney injury (oliguric) complicating CKD stage V/non-anion gap metabolic acidosis Patient reports that she follows with Dr. Ronalee Belts, nephrology but had not seen since 06/30/2021.  Baseline creatinine this year has been between 4-5 mostly.  Creatinine has abruptly increased to 7.62 >8.  Foley catheter placed on 5/2 due to urinary retention >500 mL.  AKI possibly multifactorial,?  Hemodynamics/ATN (marked hypertension followed by BP of 99/56 earlier in admission) and may be an element of urinary retention.  Renal ultrasound without hydronephrosis.  Avoid nephrotoxics and hypotension.  400 mL urine output in catheter and 1 unmeasured urine output noted.  Sodium bicarbonate increased (serum bicarbonate 13).  Nephrology following.  Underwent first HD on 5/3 with second HD on 5/4.  They are concerned that she may have progressed to ESRD and will monitor for renal recovery post today's dialysis.   Dialysis with ongoing good urine output.  Creatinine minimally up from yesterday 3.26-3.62.  AKI appears to be improving.  May not need further dialysis.  Nephrology follow-up appreciated.  Replacing K, discontinuing bicarb supplements, may DC the HD catheter tomorrow pending lab results.  Hypokalemia: Persist despite replacement yesterday.  Higher dose today  and follow BMP  HTN, h/o mod LVH grade 1 diastolic dysfunction - cont Coreg with  holding parameters- hold Lasix.  SBP in the 190s.  Added as needed IV hydralazine.  BP better but not optimal.  Could consider adding amlodipine.  Hepatic cirrhosis, coagulopathy, mild splenomegaly  Post arm pain and upper back pain - likely musculoskeletal sprain/ spasm- seems that this pain is worse than her mild back pain -  voltaren gel and k pad for now.  Pain management as noted above.  DM with hypoglycemia: A1c 4.6 on 4/18.  It appears that her glycemic control has improved, likely related to development of advanced CKD.  She had A1c of up to 8.1 in August 2021.  Due to all going recurrent hypoglycemia, SSI stopped, placed on D10 IV infusion with resolution of hypoglycemia.  Off D10.  No further hypoglycemia.  Had another low CBG of 68 earlier this morning but has since improved.  Wound LLE - follows with wound care as outpt-  -WOC RN input from 5/1 appreciated.  Refer to their note for details.   H/o DVT -Continue Eliquis.  ? Constipation - occasional- dose not take Amitiza as listed on med rec and is not aware of it.  Having BMs.  Maximize bowel regimen.  Anxiety, pain - Cymbalta, PRN Valium  Body mass index is 35.71 kg/m./Obesity Complicates care.  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  Fluctuating in the 7-8 range.  No overt bleeding reported.  Starting Aranesp per nephrology.  Hemoglobin down to 7.2.  Transfuse if hemoglobin 7 g or less.  Thrombocytopenia Platelet counts gradually dropping.?  Related to antimicrobials versus acute infection.  Continue to trend daily CBCs.  Stable.  NOTE: she states she uses PRN O2 at home.  Currently on 3 L/min.   ACP documents: None present.   Code Status: Full Code Consultants: ID, IR, nephrology DVT prophylaxis: Eliquis held.  SCDs. apixaban (ELIQUIS) tablet 2.5 mg  Family  communication: None at bedside.  Objective:   Vitals:   03/04/23 2039 03/05/23 0500 03/05/23 0542 03/05/23 0905  BP: 135/61  (!) 175/76 (!) 177/72  Pulse: 81  72 71  Resp: 18  18 17   Temp: 98.2 F (36.8 C)  97.8 F (36.6 C) 97.8 F (36.6 C)  TempSrc: Oral  Oral Oral  SpO2: 99%  100% 100%  Weight:  80.2 kg    Height:       Filed Weights   03/03/23 0500 03/03/23 1714 03/05/23 0500  Weight: 80.2 kg 80.4 kg 80.2 kg   Exam: General exam: Middle-age female, looks older than stated age, moderately built and obese, lying comfortably supine in bed without distress.  Did not appear in pain. Respiratory system: Clear to auscultation.  No increased work of breathing. Cardiovascular system: S1 & S2 heard, RRR. No JVD, murmurs, rubs, gallops or clicks. No pedal edema.   Gastrointestinal system: Abdomen is nondistended, soft and nontender. No organomegaly or masses felt. Normal bowel sounds heard. Central nervous system: Alert and oriented. No focal neurological deficits. Extremities: Symmetric 5 x 5 power. Skin: No rashes, lesions or ulcers Psychiatry: Judgement and insight appear normal. Mood & affect, unable to assess, in pain.       CBC: Recent Labs  Lab 03/02/23 0402 03/02/23 1558 03/03/23 0103 03/04/23 0347 03/05/23 0407  WBC 5.5 5.9  5.3 4.5 4.7  HGB 7.6* 8.2* 7.4* 7.6* 7.2*  HCT 25.8* 27.9* 23.8* 23.3* 23.5*  MCV 91.5 93.3 89.8 85.7 90.0  PLT 114* 120* 116* 123* 121*   Basic Metabolic Panel: Recent Labs  Lab 03/02/23 0402 03/02/23 1558 03/03/23 0103 03/04/23 0347 03/05/23 0407  NA 134* 133* 134* 134* 136  K 4.8 5.1 3.6 3.1* 3.0*  CL 110 105 104 101 100  CO2 15* 13* 23 25 26   GLUCOSE 59* 84 73 89 75  BUN 84* 83* 48* 22 22  CREATININE 8.03* 8.07* 5.32* 3.26* 3.62*  CALCIUM 7.1* 7.2* 7.3* 7.6* 7.5*  PHOS 6.4* 6.5* 4.5 2.8 3.4   GFR: Estimated Creatinine Clearance (by C-G formula based on SCr of 3.62 mg/dL (H)) Female: 16.1 mL/min (A) Female: 17 mL/min  (A)  Scheduled Meds:  acetaminophen  1,000 mg Oral TID   apixaban  2.5 mg Oral BID   calcium carbonate  1 tablet Oral Daily   carvedilol  6.25 mg Oral BID WC   Chlorhexidine Gluconate Cloth  6 each Topical Daily   Chlorhexidine Gluconate Cloth  6 each Topical Q0600   darbepoetin (ARANESP) injection - NON-DIALYSIS  40 mcg Subcutaneous Q Fri-1800   diclofenac Sodium  4 g Topical QID   DULoxetine  60 mg Oral Daily   lidocaine  2 patch Transdermal Q24H   methocarbamol  500 mg Oral TID   mometasone-formoterol  2 puff Inhalation BID   mupirocin ointment  1 Application Nasal BID   oxyCODONE  10 mg Oral Q12H   polyethylene glycol  17 g Oral Daily   pregabalin  75 mg Oral Daily   senna-docusate  1 tablet Oral BID   sodium chloride flush  10-40 mL Intracatheter Q12H   sodium chloride flush  3 mL Intravenous Q12H   Continuous Infusions:  DAPTOmycin (CUBICIN) 650 mg in sodium chloride 0.9 % IVPB 650 mg (03/03/23 1700)   Imaging and lab data was personally reviewed No results found.  LOS: 6 days   Marcellus Scott, MD,  FACP, [facility], [facility], [facility], [facility]   [facility] [place]     To contact the attending provider between 7A-7P or the covering provider during after hours 7P-7A, please log into the web site [email or web address] and access using universal  password for that web site. If you do not have the password, please call the hospital operator.

## 2023-03-05 NOTE — Plan of Care (Signed)
  Problem: Elimination: Goal: Will not experience complications related to urinary retention Outcome: Progressing   Problem: Pain Managment: Goal: General experience of comfort will improve Outcome: Progressing   

## 2023-03-06 DIAGNOSIS — E1169 Type 2 diabetes mellitus with other specified complication: Secondary | ICD-10-CM | POA: Diagnosis not present

## 2023-03-06 DIAGNOSIS — N179 Acute kidney failure, unspecified: Secondary | ICD-10-CM | POA: Diagnosis not present

## 2023-03-06 DIAGNOSIS — E876 Hypokalemia: Secondary | ICD-10-CM | POA: Diagnosis not present

## 2023-03-06 DIAGNOSIS — M4644 Discitis, unspecified, thoracic region: Secondary | ICD-10-CM | POA: Diagnosis not present

## 2023-03-06 DIAGNOSIS — M4624 Osteomyelitis of vertebra, thoracic region: Secondary | ICD-10-CM | POA: Diagnosis not present

## 2023-03-06 LAB — CBC
HCT: 24.7 % — ABNORMAL LOW (ref 36.0–46.0)
Hemoglobin: 7.5 g/dL — ABNORMAL LOW (ref 12.0–15.0)
MCH: 27.8 pg (ref 26.0–34.0)
MCHC: 30.4 g/dL (ref 30.0–36.0)
MCV: 91.5 fL (ref 80.0–100.0)
Platelets: 130 10*3/uL — ABNORMAL LOW (ref 150–400)
RBC: 2.7 MIL/uL — ABNORMAL LOW (ref 3.87–5.11)
RDW: 15.9 % — ABNORMAL HIGH (ref 11.5–15.5)
WBC: 5.1 10*3/uL (ref 4.0–10.5)
nRBC: 0 % (ref 0.0–0.2)

## 2023-03-06 LAB — GLUCOSE, CAPILLARY
Glucose-Capillary: 95 mg/dL (ref 70–99)
Glucose-Capillary: 113 mg/dL — ABNORMAL HIGH (ref 70–99)
Glucose-Capillary: 95 mg/dL (ref 70–99)
Glucose-Capillary: 116 mg/dL — ABNORMAL HIGH (ref 70–99)

## 2023-03-06 LAB — RENAL FUNCTION PANEL
Albumin: 2.5 g/dL — ABNORMAL LOW (ref 3.5–5.0)
Anion gap: 8 (ref 5–15)
BUN: 24 mg/dL — ABNORMAL HIGH (ref 8–23)
CO2: 26 mmol/L (ref 22–32)
Calcium: 7.7 mg/dL — ABNORMAL LOW (ref 8.9–10.3)
Chloride: 101 mmol/L (ref 98–111)
Creatinine, Ser: 3.79 mg/dL — ABNORMAL HIGH (ref 0.44–1.00)
GFR, Estimated: 12 mL/min — ABNORMAL LOW (ref >60)
Glucose, Bld: 84 mg/dL (ref 70–99)
Phosphorus: 2.9 mg/dL (ref 2.5–4.6)
Potassium: 3.3 mmol/L — ABNORMAL LOW (ref 3.5–5.1)
Sodium: 135 mmol/L (ref 135–145)

## 2023-03-06 LAB — TYPE AND SCREEN
ABO/RH(D): A NEG
Antibody Screen: NEGATIVE

## 2023-03-06 LAB — CK: Total CK: 71 U/L (ref 38–234)

## 2023-03-06 MED ORDER — POTASSIUM CHLORIDE 20 MEQ PO PACK
40.0000 meq | PACK | Freq: Once | ORAL | Status: AC
  Administered 2023-03-06: 40 meq via ORAL
  Filled 2023-03-06: qty 2

## 2023-03-06 MED ORDER — DAPTOMYCIN IV (FOR PTA / DISCHARGE USE ONLY)
650.0000 mg | INTRAVENOUS | 0 refills | Status: DC
Start: 2023-03-07 — End: 2023-04-02

## 2023-03-06 NOTE — Progress Notes (Signed)
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)   S/P lumbar fusion   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)   IDA (iron deficiency anemia)   PTSD (post-traumatic stress disorder)   Hypertension   High cholesterol   GERD (gastroesophageal reflux disease)   Fibromyalgia   Depression   Asthma   Anxiety   Anemia   Hypothyroidism   CKD (chronic kidney disease) stage 5, GFR less than 15 ml/min (HCC)   Osteomyelitis (HCC)          Assessment: 67 YF admitted with:  #Discitis/osteomyelitis #PICC in place-RIJ Patient with extensive history of infection in her back with known thoracolumbar discitis/osteomyelitis and instrumentation.  Previously, in March 2021-> treated for thoracolumbar infection due to Pseudomonas.  - Cultures have yielded MRSE with T10-T11 discitis with erosion requiring revision and extension of fusion to T8 in August 2023.  Imaging in August 2023 also noted early discitis/OM of T4-5.  She was supposed to be on doxycycline suppression but did not tolerate this and subsequently stopped antibiotics after IV therapy in October 2023.  She now has progression of infection at T4-5 noted on MRI in March 2024 with aspiration again yielding MRSE and currently on a prolonged course of daptomycin due to this through 03/30/2023.  -Rreadmitted with worsening back pain and MRI showing new  osteomyelitis at T3 with vertebral collapse of T4 since her previous MRI in March.There is mild increase in size of suspected dorsal epidural fluid collection measuring 7 mm.  Reccs -Pt underwent disc aspiration on 5/3, Cx NG x 3 days  -Continue dapto to complete additional 2 weeks of IV abx EOT 04/13/23 and ID F/U with myself -I think it is unclear if this is a new infection as MRI findings can lag. This could be the case when MRI in March(ESR and CRP trended down), regardless will treat for additional 2 weeks-> omada for chronic suppression.   #Acute on CKD stage V:  -Scr 3.79 today -Nephro following, likely rermove HD cath today if stable  #Obesity:  -Body mass index is 35, dapto weight based  #Nonhealing left heel wound:  -Evaluated by wound care whom she also follows with as an outpatient.   #DM -A1c 4.6 on 4/18  ID will sign off.  OPAT ORDERS:  Diagnosis: Discitis Lanetta Inch    Allergies  Allergen Reactions   Bee Pollen Anaphylaxis   Broccoli [Brassica Oleracea] Anaphylaxis and Hives   Fish-Derived Products Anaphylaxis and Hives   Mushroom Extract Complex Anaphylaxis   Penicillins Anaphylaxis    **Tolerated cefepime March 2021 Did it involve swelling of the face/tongue/throat, SOB, or low BP? Yes Did it involve sudden or severe rash/hives, skin peeling, or any reaction on the inside of your mouth or nose? No Did you need to seek medical attention at a hospital or doctor's office? Yes When did it  last happen?  A few months ago If all above answers are "NO", may proceed with cephalosporin use.   Rosemary Oil Anaphylaxis   Shellfish Allergy Anaphylaxis and Hives   Tomato Anaphylaxis and Hives    Raw tomatoes only, can tolerate if cooked   Acetaminophen Other (See Comments)    GI upset   Aloe Vera Hives   Doxycycline Diarrhea   Acyclovir And Related Other (See Comments)    Unknown reaction   Naproxen Hives     Discharge antibiotics to be given via PICC line:  Per  pharmacy protocol dapto 8 mg/kg/day Aim for Vancomycin trough 15-20 or AUC 400-550 (unless otherwise indicated)   Duration: 6 weeks End Date: 04/13/23  [facility] Care Per Protocol with Biopatch Use: Home health RN for IV administration and teaching, line care and labs.    Labs weekly while on IV antibiotics: _x_ CBC with differential __ BMP **TWICE WEEKLY ON VANCOMYCIN  __x CMP __ xCRP _x_ ESR __ Vancomycin trough TWICE WEEKLY __x CK  __ Please pull PIC at completion of IV antibiotics _x_ Please leave PIC in place until doctor has seen patient or been notified  Fax weekly labs to [phone]  Clinic Follow Up Appt: 6/13  @ [facility] with Dr. Thedore Mins  Microbiology:   Antibiotics: daptomycin Cultures: 5/3 aspirate Cx   SUBJECTIVE: Resting in bed. .  Interval: Afebrile overnight, NO new complaints  Review of Systems: Review of Systems  All other systems reviewed and are negative.    Scheduled Meds:  acetaminophen  1,000 mg Oral TID   apixaban  2.5 mg Oral BID   calcium carbonate  1 tablet Oral Daily   carvedilol  6.25 mg Oral BID WC   Chlorhexidine Gluconate Cloth  6 each Topical Daily   Chlorhexidine Gluconate Cloth  6 each Topical Q0600   darbepoetin (ARANESP) injection - NON-DIALYSIS  40 mcg Subcutaneous Q Fri-1800   diclofenac Sodium  4 g Topical QID   DULoxetine  60 mg Oral Daily   lidocaine  2 patch Transdermal Q24H   methocarbamol  500 mg Oral TID   mometasone-formoterol  2 puff Inhalation BID   oxyCODONE  10 mg Oral Q12H   polyethylene glycol  17 g Oral Daily   pregabalin  75 mg Oral Daily   senna-docusate  1 tablet Oral BID   sodium chloride flush  10-40 mL Intracatheter Q12H   sodium chloride flush  3 mL Intravenous Q12H   Continuous Infusions:  DAPTOmycin (CUBICIN) 650 mg in sodium chloride 0.9 % IVPB 650 mg (03/05/23 1616)   PRN Meds:.albuterol, bisacodyl, diazepam, hydrALAZINE, HYDROmorphone (DILAUDID) injection, naLOXone (NARCAN)  injection,  ondansetron (ZOFRAN) IV, oxyCODONE, polyethylene glycol Allergies  Allergen Reactions   Bee Pollen Anaphylaxis   Broccoli [Brassica Oleracea] Anaphylaxis and Hives   Fish-Derived Products Anaphylaxis and Hives   Mushroom Extract Complex Anaphylaxis   Penicillins Anaphylaxis    **Tolerated cefepime March 2021 Did it involve swelling of the face/tongue/throat, SOB, or low BP? Yes Did it involve sudden or severe rash/hives, skin peeling, or any reaction on the inside of your mouth or nose? No Did you need to seek medical attention at a hospital or doctor's office? Yes When did it last happen?  A few months ago If all above answers are "NO", may proceed with cephalosporin use.   Rosemary Oil Anaphylaxis   Shellfish Allergy Anaphylaxis and Hives   Tomato Anaphylaxis and Hives    Raw tomatoes only, can tolerate if cooked  Acetaminophen Other (See Comments)    GI upset   Aloe Vera Hives   Doxycycline Diarrhea   Acyclovir And Related Other (See Comments)    Unknown reaction   Naproxen Hives    OBJECTIVE: Vitals:   03/05/23 2131 03/06/23 0514 03/06/23 0825 03/06/23 0934  BP: (!) 121/50 (!) 158/66  (!) 164/69  Pulse: 73 63  63  Resp:  17  16  Temp:  98.6 F (37 C)  97.7 F (36.5 C)  TempSrc:    Oral  SpO2: 100% 96% 96% 95%  Weight:  82.4 kg    Height:       Body mass index is 36.69 kg/m.  Physical Exam Constitutional:      Appearance: Normal appearance.  HENT:     Head: Normocephalic and atraumatic.     Right Ear: Tympanic membrane normal.     Left Ear: Tympanic membrane normal.     Nose: Nose normal.     Mouth/Throat:     Mouth: Mucous membranes are moist.  Eyes:     Extraocular Movements: Extraocular movements intact.     Conjunctiva/sclera: Conjunctivae normal.     Pupils: Pupils are equal, round, and reactive to light.  Cardiovascular:     Rate and Rhythm: Normal rate and regular rhythm.     Heart sounds: No murmur heard.    No friction rub. No gallop.   Pulmonary:     Effort: Pulmonary effort is normal.     Breath sounds: Normal breath sounds.  Abdominal:     General: Abdomen is flat.     Palpations: Abdomen is soft.  Skin:    General: Skin is warm and dry.  Neurological:     General: No focal deficit present.     Mental Status: She is alert and oriented to person, place, and time.  Psychiatric:        Mood and Affect: Mood normal.       Lab Results Lab Results  Component Value Date   WBC 5.1 03/06/2023   HGB 7.5 (L) 03/06/2023   HCT 24.7 (L) 03/06/2023   MCV 91.5 03/06/2023   PLT 130 (L) 03/06/2023    Lab Results  Component Value Date   CREATININE 3.79 (H) 03/06/2023   BUN 24 (H) 03/06/2023   NA 135 03/06/2023   K 3.3 (L) 03/06/2023   CL 101 03/06/2023   CO2 26 03/06/2023    Lab Results  Component Value Date   ALT 8 02/27/2023   AST 11 (L) 02/27/2023   ALKPHOS 151 (H) 02/27/2023   BILITOT 0.4 02/27/2023        Danelle Earthly, MD [facility] 03/06/2023, 11:10 AM

## 2023-03-06 NOTE — Progress Notes (Signed)
Pharmacy Antibiotic Note  [name] [name] is a 67 y.o. adult admitted on 02/26/2023 with  osteomyelitis .  Pharmacy has been consulted for daptomycin dosing.  Patient was recently discharged 4/7 after being hospitalized with T4/5 osteomyelitis. She was discharged with IV daptomycin x 8 weeks, per ID, with an end date set for 03/30/23. Due to the patient's poor renal function, she has been receiving IV daptomycin 8mg /kg IV q48hrs while outpatient. Pharmacy will continue this dose inpatient. Patient reported last dose of IV daptomycin was 4/28 ~1600-1700.   CK 71 (03/06/2023) WBC 5.1, afebrile  Plan: - Continue IV daptomycin 8mg /kg q48hrs  - Check CK weekly while on daptomycin  - Monitor renal function and overall clinical picture   Height: 4\' 11"  (149.9 cm) Weight: 82.4 kg (181 lb 10.5 oz) IBW/kg (Calculated) : 43.2  Temp (24hrs), Avg:98.2 F (36.8 C), Min:97.8 F (36.6 C), Max:98.6 F (37 C)  Recent Labs  Lab 03/02/23 1558 03/03/23 0103 03/04/23 0347 03/05/23 0407 03/06/23 0329  WBC 5.9 5.3 4.5 4.7 5.1  CREATININE 8.07* 5.32* 3.26* 3.62* 3.79*     Estimated Creatinine Clearance (by C-G formula based on SCr of 3.79 mg/dL (H)) Female: 16.1 mL/min (A) Female: 16.5 mL/min (A)    Allergies  Allergen Reactions   Bee Pollen Anaphylaxis   Broccoli [Brassica Oleracea] Anaphylaxis and Hives   Fish-Derived Products Anaphylaxis and Hives   Mushroom Extract Complex Anaphylaxis   Penicillins Anaphylaxis    **Tolerated cefepime March 2021 Did it involve swelling of the face/tongue/throat, SOB, or low BP? Yes Did it involve sudden or severe rash/hives, skin peeling, or any reaction on the inside of your mouth or nose? No Did you need to seek medical attention at a hospital or doctor's office? Yes When did it last happen?  A few months ago If all above answers are "NO", may proceed with cephalosporin use.   Rosemary Oil Anaphylaxis   Shellfish Allergy Anaphylaxis and Hives   Tomato  Anaphylaxis and Hives    Raw tomatoes only, can tolerate if cooked   Acetaminophen Other (See Comments)    GI upset   Aloe Vera Hives   Doxycycline Diarrhea   Acyclovir And Related Other (See Comments)    Unknown reaction   Naproxen Hives    Antimicrobials this admission: 4/29 ceftriaxone 2g x1  4/5 daptomycin >> (5/31)  Dose adjustments this admission: N/A  Microbiology results: 4/29 BCx: [id number] 5/3 Fine needle aspirate of wound- Gram's stain shows WBC predom. PMN w/ no organisms seen. Culture shows ngtd4    Thank you for allowing pharmacy to be a part of this patient's care.  Greta Doom BS, PharmD, BCPS Clinical Pharmacist 03/06/2023 8:52 AM  Contact: [phone] after 3 PM  "Be curious, not judgmental..." -Debbora Dus

## 2023-03-06 NOTE — Progress Notes (Signed)
PHARMACY CONSULT NOTE FOR:  OUTPATIENT  PARENTERAL ANTIBIOTIC THERAPY (OPAT)  Indication: Discitis/osteomyelitis  Regimen: Daptomycin 650 mg IV every 48 hours  End date: 04/13/23  IV antibiotic discharge orders are pended. To discharging provider:  please sign these orders via discharge navigator,  Select New Orders & click on the button choice - Manage This Unsigned Work.     Thank you for allowing pharmacy to be a part of this patient's care.  Sharin Mons, PharmD, BCPS, BCIDP Infectious Diseases Clinical Pharmacist Phone: [phone] 03/06/2023, 12:17 PM

## 2023-03-06 NOTE — Progress Notes (Signed)
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  Previously, in March 2021-> treated for thoracolumbar infection due to Pseudomonas.  - Cultures have yielded MRSE with T10-T11 discitis with erosion requiring revision and extension of fusion to T8 in August 2023.  Imaging in August 2023 also noted early discitis/OM of T4-5.  She was supposed to be on doxycycline suppression but did not tolerate this and subsequently stopped antibiotics after IV therapy in October 2023.  She now has progression of infection at T4-5 noted on MRI in March 2024 with aspiration again yielding MRSE and currently on a prolonged course of daptomycin due to this through 03/30/2023.  -Rreadmitted with worsening back pain and MRI showing new osteomyelitis at T3 with  vertebral collapse of T4 since her previous MRI in March.There is mild increase in size of suspected dorsal epidural fluid collection measuring 7 mm.  Reccs -Pt underwent disc aspiration on 5/3, follow Cx -Continue dapto  #Acute on CKD stage V:  Nephro following, likely rermove HD cath tomorrow if stable  #Obesity:  -Body mass index is 35  #Nonhealing left heel wound:  -Evaluated by wound care whom she also follows with as an outpatient.   #DM -A1c 4.6 on 4/18    Microbiology:   Antibiotics: daptomycin Cultures: 5/3 aspirate Cx   SUBJECTIVE: Resting in bed. .  Interval: Afebrile overnight, NO new complaints  Review of Systems: ROS   Scheduled Meds:  acetaminophen  1,000 mg Oral TID   apixaban  2.5 mg Oral BID   calcium carbonate  1 tablet Oral Daily   carvedilol  6.25 mg Oral BID WC   Chlorhexidine Gluconate Cloth  6 each Topical Daily   Chlorhexidine Gluconate Cloth  6 each Topical Q0600   darbepoetin (ARANESP) injection - NON-DIALYSIS  40 mcg Subcutaneous Q Fri-1800   diclofenac Sodium  4 g Topical QID   DULoxetine  60 mg Oral Daily   lidocaine  2 patch Transdermal Q24H   methocarbamol  500 mg Oral TID   mometasone-formoterol  2 puff Inhalation BID   mupirocin ointment  1 Application Nasal BID   oxyCODONE  10 mg Oral Q12H   polyethylene glycol  17 g Oral Daily   potassium chloride  40  mEq Oral Once   pregabalin  75 mg Oral Daily   senna-docusate  1 tablet Oral BID   sodium chloride flush  10-40 mL Intracatheter Q12H   sodium chloride flush  3 mL Intravenous Q12H   Continuous Infusions:  DAPTOmycin (CUBICIN) 650 mg in sodium chloride 0.9 % IVPB 650 mg (03/05/23 1616)   PRN Meds:.albuterol, bisacodyl, diazepam, hydrALAZINE, HYDROmorphone (DILAUDID) injection, naLOXone (NARCAN)  injection, ondansetron (ZOFRAN) IV, oxyCODONE, polyethylene glycol Allergies  Allergen Reactions   Bee Pollen Anaphylaxis   Broccoli [Brassica Oleracea] Anaphylaxis and Hives    Fish-Derived Products Anaphylaxis and Hives   Mushroom Extract Complex Anaphylaxis   Penicillins Anaphylaxis    **Tolerated cefepime March 2021 Did it involve swelling of the face/tongue/throat, SOB, or low BP? Yes Did it involve sudden or severe rash/hives, skin peeling, or any reaction on the inside of your mouth or nose? No Did you need to seek medical attention at a hospital or doctor's office? Yes When did it last happen?  A few months ago If all above answers are "NO", may proceed with cephalosporin use.   Rosemary Oil Anaphylaxis   Shellfish Allergy Anaphylaxis and Hives   Tomato Anaphylaxis and Hives    Raw tomatoes only, can tolerate if cooked   Acetaminophen Other (See Comments)    GI upset   Aloe Vera Hives   Doxycycline Diarrhea   Acyclovir And Related Other (See Comments)    Unknown reaction   Naproxen Hives    OBJECTIVE: Vitals:   03/05/23 0905 03/05/23 1522 03/05/23 2131 03/06/23 0514  BP: (!) 177/72 (!) 155/82 (!) 121/50 (!) 158/66  Pulse: 71 78 73 63  Resp: 17 17  17   Temp: 97.8 F (36.6 C) 98.1 F (36.7 C)  98.6 F (37 C)  TempSrc: Oral Oral    SpO2: 100% 99% 100% 96%  Weight:    82.4 kg  Height:       Body mass index is 36.69 kg/m.  Physical Exam    Lab Results Lab Results  Component Value Date   WBC 5.1 03/06/2023   HGB 7.5 (L) 03/06/2023   HCT 24.7 (L) 03/06/2023   MCV 91.5 03/06/2023   PLT 130 (L) 03/06/2023    Lab Results  Component Value Date   CREATININE 3.79 (H) 03/06/2023   BUN 24 (H) 03/06/2023   NA 135 03/06/2023   K 3.3 (L) 03/06/2023   CL 101 03/06/2023   CO2 26 03/06/2023    Lab Results  Component Value Date   ALT 8 02/27/2023   AST 11 (L) 02/27/2023   ALKPHOS 151 (H) 02/27/2023   BILITOT 0.4 02/27/2023        Danelle Earthly, MD [facility] 03/06/2023, 6:03 AM

## 2023-03-06 NOTE — Evaluation (Signed)
Physical Therapy Evaluation Patient Details Name: [name] [name] MRN: [id number] DOB: 19-Oct-1956 Today's Date: 03/06/2023  History of Present Illness  Pt is 67 year old who is moslty wheelchair bound and has CKD stage V, hypertension, cirrhosis of the liver, OSA, morbid obesity, L1 osteomyelitis status post L1 corpectomy, T10-T11 discitis osteomyelitis status post posterior arthrodesis of T8-T11 and removal of T11 pedicle screws and rod in 12/23.  Last month, diagnosed with T4-T5 discitis osteomyelitis which was being treated with daptomycin.  Neurosurgery did not recommend any further interventions.     The patient returns to the hospital for worsening back pain for [redacted] week along with an episode of vomiting.  Pt found to have AKI requiring 2 cycles of HD.  Also, with ongoing osteomyelitis/discitis Thoracic spine with T4-5 disc aspiration by IR on 5/3.  Neurosurgery recommends further medical management, no surgical intervention at this time.  Clinical Impression  Pt admitted with above diagnosis. At baseline, pt mostly uses a w/c or powerchair.  She is able to stand pivot to chair.  She does occasionally walk short distance (~5') with RW and spouse assist.  Pt has all necessary DME and support from spouse at home.  Today, she was able to sit EOB with min guard for safety demonstrating log roll technique.  Pt able to lateral scoot at EOB and with good sitting balance.  She did stand briefly for pad change but had severe increase in pain and requiring min A for balance then returned to sidelying.  Pain eased at rest and with spouse applying voltaren gel.  Pt likely near her baseline but will benefit from acute PT to advance to baseline while hospitalized.  Pt currently with functional limitations due to the deficits listed below (see PT Problem List). Pt will benefit from acute skilled PT to increase their independence and safety with mobility to allow discharge.          Recommendations for follow up  therapy are one component of a multi-disciplinary discharge planning process, led by the attending physician.  Recommendations may be updated based on patient status, additional functional criteria and insurance authorization.         Assistance Recommended at Discharge Intermittent Supervision/Assistance  Patient can return home with the following  A little help with walking and/or transfers;A little help with bathing/dressing/bathroom;Assistance with cooking/housework;Help with stairs or ramp for entrance    Equipment Recommendations None recommended by PT  Recommendations for Other Services       Functional Status Assessment Patient has had a recent decline in their functional status and demonstrates the ability to make significant improvements in function in a reasonable and predictable amount of time.     Precautions / Restrictions Precautions Precautions: Back Required Braces or Orthoses: Spinal Brace Spinal Brace: Thoracolumbosacral orthotic Spinal Brace Comments: No orders but pt had from prior admission and wears when OOB      Mobility  Bed Mobility Overal bed mobility: Needs Assistance Bed Mobility: Rolling, Sidelying to Sit, Sit to Sidelying Rolling: Supervision Sidelying to sit: Min guard     Sit to sidelying: Min guard General bed mobility comments: Increased time, log roll technique    Transfers Overall transfer level: Needs assistance Equipment used: 1 person hand held assist Transfers: Sit to/from Stand Sit to Stand: Min assist          Lateral/Scoot Transfers: Min guard General transfer comment: Min A to stand and steady - prefered holding onto husbands hands over RW.  Significantly increased pain with  standing that eased when returned to bed. She was able to lateral scoot up EOB with min guard    Ambulation/Gait               General Gait Details: Declined ambulation or transfer to chair due to pain  Stairs            Wheelchair  Mobility    Modified Rankin (Stroke Patients Only)       Balance Overall balance assessment: Needs assistance Sitting-balance support: Feet supported Sitting balance-Leahy Scale: Good Sitting balance - Comments: Sat EOB 8 mins before needing to return to supine.  Using bil LE but for pain control   Standing balance support: Reliant on assistive device for balance Standing balance-Leahy Scale: Poor Standing balance comment: min A when briefly standing for pad change                             Pertinent Vitals/Pain Pain Assessment Pain Assessment: Faces Faces Pain Scale: Hurts whole lot Pain Location: Back when standing Pain Descriptors / Indicators: Grimacing, Guarding Pain Intervention(s): Limited activity within patient's tolerance, Monitored during session, Repositioned, Premedicated before session    Home Living Family/patient expects to be discharged to:: Private residence Living Arrangements: Spouse/significant other Available Help at Discharge: Family;Available PRN/intermittently Type of Home: House Home Access: Ramped entrance       Home Layout: One level Home Equipment: Wheelchair - Biomedical scientist (2 wheels);Wheelchair - power;Electric scooter Additional Comments: husband helps her walk    Prior Function Prior Level of Function : Independent/Modified Independent;Needs assist             Mobility Comments: no driving, pt's husband assists with gait as needed . Pt ambulatory for short distances with RW (~5'), otherwise uses w/c ADLs Comments: able to dress and bathe self, husband cooks/cleans     Hand Dominance        Extremity/Trunk Assessment   Upper Extremity Assessment Upper Extremity Assessment: Defer to OT evaluation    Lower Extremity Assessment Lower Extremity Assessment: LLE deficits/detail;RLE deficits/detail RLE Deficits / Details: ROM WFL; MMT ankle 5/5, knee 5/5, hip 4/5; light testing due to back  pain LLE Deficits / Details: ROM WFL; MMT ankle 5/5, knee 5/5, hip 4/5; light testing due to back pain    Cervical / Trunk Assessment Cervical / Trunk Assessment: Kyphotic  Communication   Communication: No difficulties  Cognition Arousal/Alertness: Awake/alert Behavior During Therapy: Anxious Overall Cognitive Status: Within Functional Limits for tasks assessed                                          General Comments General comments (skin integrity, edema, etc.): Pt's spouse applying Voltaren gel when pt back in bed with her reporting significantly improved pain.    Exercises General Exercises - Lower Extremity Ankle Circles/Pumps: AROM, Both, 15 reps, Seated Long Arc Quad: AROM, Both, 15 reps, Seated   Assessment/Plan    PT Assessment Patient needs continued PT services  PT Problem List Decreased strength;Pain;Decreased range of motion;Decreased activity tolerance;Decreased balance;Decreased mobility;Decreased knowledge of precautions;Decreased knowledge of use of DME       PT Treatment Interventions DME instruction;Therapeutic exercise;Wheelchair mobility training;Gait training;Balance training;Modalities;Functional mobility training;Therapeutic activities;Patient/family education    PT Goals (Current goals can be found in the Care Plan section)  Acute Rehab PT Goals  Patient Stated Goal: return home PT Goal Formulation: With patient/family Time For Goal Achievement: 03/20/23 Potential to Achieve Goals: Good    Frequency Min 3X/week     Co-evaluation               AM-PAC PT "6 Clicks" Mobility  Outcome Measure Help needed turning from your back to your side while in a flat bed without using bedrails?: A Little Help needed moving from lying on your back to sitting on the side of a flat bed without using bedrails?: A Little Help needed moving to and from a bed to a chair (including a wheelchair)?: A Little Help needed standing up from a chair  using your arms (e.g., wheelchair or bedside chair)?: A Little Help needed to walk in hospital room?: Total Help needed climbing 3-5 steps with a railing? : Total 6 Click Score: 14    End of Session   Activity Tolerance: Patient limited by pain Patient left: in bed;with call bell/phone within reach;with family/visitor present;with bed alarm set Nurse Communication: Mobility status PT Visit Diagnosis: Other abnormalities of gait and mobility (R26.89);Muscle weakness (generalized) (M62.81);Pain Pain - part of body:  (back)    Time: 1610-9604 PT Time Calculation (min) (ACUTE ONLY): 22 min   Charges:   PT Evaluation $PT Eval Low Complexity: 1 Low          Moo Gravley, PT Acute Rehab [facility] Rehab [phone]   Rayetta Humphrey 03/06/2023, 5:44 PM

## 2023-03-06 NOTE — Progress Notes (Signed)
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 Last month, diagnosed with T4-T5 discitis osteomyelitis which was being treated with daptomycin.  Neurosurgery did not recommend any further interventions.  The patient returns to the hospital for worsening back pain for [redacted] week along with an episode of vomiting. CT chest abdomen pelvis: Prior T4-T5 osteomyelitis discitis, difficult to rule out osteomyelitis and T5, continued paraspinal soft tissue density. 4/30: ID consult 5/2: Communicated with neurosurgery (note by Dr. Maisie Fus), recommend continued medical management.  Nephrology consultation for acute renal failure. 5/3: T5 core biopsy and T4-5 disc aspiration by IR.  Left jugular triple-lumen HD catheter placed by IR, underwent HD x 2 on 5/3 and 5/4  Subjective:  Reports pain is well-controlled on current regimen.  Also states that she was up in the chair for about 2 hours or more yesterday.  Assessment and Plan: Principal Problem:   Acute osteomyelitis/discitis of thoracic spine, rule out abscess- recently grew MRSE -MRI obtained and reveals new T3 marrow edema, persistent T4 and T5 infection, epidural fluid collection increased from prior imaging of about 7 mm-abscess versus phlegmon, paraspinal phlegmon at T4-T5 -CRP 1.0, ESR 74, Thrombocytopenia stable. Blood Cx's 4/29: Neg. - S/P T5 core biopsy and T4-5 disc aspiration by IR on 5/3.  -Discussed with Dr. Maisie Fus, neurosurgery 5/2 and his note appreciated.  Recommends medical management and patient is at high risk for any surgical  intervention. -Multimodality pain control.  scheduled Tylenol, as needed Oxy IR, as needed IV Dilaudid, Lyrica, Cymbalta, Robaxin, lidocaine patch and added OxyContin 10 mg twice daily since 5/5. Pain better controlled.   -Fine-needle aspirate T4-T5 disc 5/3: No organisms seen on Gram stain and NTD cultures after 4 days. -T5 bone biopsy: Chronic osteomyelitis with marked reactive bone changes.  Negative for malignancy. -Per ID signoff note from 5/7: Complete additional 2 weeks of IV daptomycin with end of treatment 04/13/2023, ID follow-up with Dr. Thedore Mins on 6/13.  Active Problems: Acute kidney injury (oliguric) complicating CKD stage V/non-anion gap metabolic acidosis Patient reports that she follows with Dr. Ronalee Belts, nephrology but had not seen since 06/30/2021.  Baseline creatinine this year has been between 4-5 mostly.  Creatinine has abruptly increased to 7.62 >8.  Foley catheter placed on 5/2 due to urinary retention >500 mL.  AKI possibly multifactorial,?  Hemodynamics/ATN (marked hypertension followed by BP of 99/56 earlier in admission) and may be an element of urinary retention.  Renal ultrasound without hydronephrosis.  Avoid nephrotoxics and hypotension.  Nephrology following.  Temporary HD catheter placed.  Underwent 2 cycles of HD on 5/3 and 5/4 followed by good urine output, improved creatinine.   Monitoring now off HD for creatinine stabilization prior to pulling HD catheter and discharging home.  Creatinine has minimally but gradually trended up over the last 3 days, up to 3.79 today.  Discussed with Dr. Valentino Nose.  If labs and patient stable, considering removing temporary HD catheter in AM.  Sodium bicarbonate was discontinued.  800 mL urine output documented yesterday and 450 mL thus far today.  Hypokalemia: Continue to replace as needed and follow BMP.  HTN, h/o mod LVH grade 1 diastolic dysfunction - cont Coreg with holding parameters- hold Lasix.  SBP in the 190s.  Added as needed IV  hydralazine.  BP improved.  Could consider adding amlodipine.  Hepatic cirrhosis, coagulopathy, mild splenomegaly  Post arm pain and upper back pain - likely musculoskeletal sprain/ spasm- seems that this pain is worse than her mild back pain -  voltaren gel and k pad for now.  Pain management as noted above.  DM with hypoglycemia: A1c 4.6 on 4/18.  It appears that her glycemic control has improved, likely related to development of advanced CKD.  She had A1c of up to 8.1 in August 2021.  Due to ongoing recurrent hypoglycemia, SSI stopped, placed on D10 IV infusion with resolution of hypoglycemia.  Off D10.  No further hypoglycemia.  Had another low CBG of 68 on 5/6 morning but none since.  Wound LLE - follows with wound care as outpt-  -WOC RN input from 5/1 appreciated.  Refer to their note for details.   H/o DVT -Continue Eliquis.  ? Constipation - occasional- dose not take Amitiza as listed on med rec and is not aware of it.  Having BMs.  Maximize bowel regimen.  Anxiety, pain - Cymbalta, PRN Valium  Body mass index is 36.69 kg/m./Obesity Complicates care.  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  Fluctuating in the 7-8 range.  No overt bleeding reported.  Starting Aranesp per nephrology.  Hemoglobin remained stable in the low 7 g range.  Transfuse if hemoglobin 7 g or less.  Thrombocytopenia ?  Related to antimicrobials versus acute infection.  Continue to trend daily CBCs.  Stable.  NOTE: she states she uses PRN O2 at home.  Currently/rating in the mid 90s on room air.   ACP documents: None present.   Code Status: Full Code Consultants: ID, IR, nephrology DVT prophylaxis: Eliquis held.  SCDs. apixaban (ELIQUIS) tablet 2.5 mg  Family communication: None at bedside today.  Objective:   Vitals:   03/06/23 0514 03/06/23 0825  03/06/23 0934 03/06/23 1613  BP: (!) 158/66  (!) 164/69 (!) 155/73  Pulse: 63  63 73  Resp: 17  16 15   Temp: 98.6 F (37 C)  97.7 F (36.5 C) (!) 97.2 F (36.2 C)  TempSrc:   Oral Oral  SpO2: 96% 96% 95% 92%  Weight: 82.4 kg     Height:       Filed Weights   03/03/23 1714 03/05/23 0500 03/06/23 0514  Weight: 80.4 kg 80.2 kg 82.4 kg   Exam: General exam: Middle-age female, looks older than stated age, moderately built and obese, lying comfortably supine in bed without distress.  Has not appeared in pain over the last 2 days.  Appears to be in good spirits. Respiratory system: Clear to auscultation.  No increased work of breathing. Cardiovascular system: S1 & S2 heard, RRR. No JVD, murmurs, rubs, gallops or clicks. No pedal edema.  Off telemetry. Gastrointestinal system: Abdomen is nondistended, soft and nontender. No organomegaly or masses felt. Normal bowel sounds heard. Central nervous system: Alert and oriented. No focal neurological deficits. Extremities: Symmetric 5 x 5 power. Skin: No rashes, lesions or ulcers Psychiatry: Judgement and insight appear normal. Mood & affect, pleasant and appropriate.      CBC: Recent Labs  Lab 03/02/23 1558 03/03/23 0103  03/04/23 0347 03/05/23 0407 03/06/23 0329  WBC 5.9 5.3 4.5 4.7 5.1  HGB 8.2* 7.4* 7.6* 7.2* 7.5*  HCT 27.9* 23.8* 23.3* 23.5* 24.7*  MCV 93.3 89.8 85.7 90.0 91.5  PLT 120* 116* 123* 121* 130*   Basic Metabolic Panel: Recent Labs  Lab 03/02/23 1558 03/03/23 0103 03/04/23 0347 03/05/23 0407 03/06/23 0329  NA 133* 134* 134* 136 135  K 5.1 3.6 3.1* 3.0* 3.3*  CL 105 104 101 100 101  CO2 13* 23 25 26 26   GLUCOSE 84 73 89 75 84  BUN 83* 48* 22 22 24*  CREATININE 8.07* 5.32* 3.26* 3.62* 3.79*  CALCIUM 7.2* 7.3* 7.6* 7.5* 7.7*  PHOS 6.5* 4.5 2.8 3.4 2.9   GFR: Estimated Creatinine Clearance (by C-G formula based on SCr of 3.79 mg/dL (H)) Female: 16.1 mL/min (A) Female: 16.5 mL/min (A)  Scheduled Meds:   acetaminophen  1,000 mg Oral TID   apixaban  2.5 mg Oral BID   calcium carbonate  1 tablet Oral Daily   carvedilol  6.25 mg Oral BID WC   Chlorhexidine Gluconate Cloth  6 each Topical Daily   Chlorhexidine Gluconate Cloth  6 each Topical Q0600   darbepoetin (ARANESP) injection - NON-DIALYSIS  40 mcg Subcutaneous Q Fri-1800   diclofenac Sodium  4 g Topical QID   DULoxetine  60 mg Oral Daily   lidocaine  2 patch Transdermal Q24H   methocarbamol  500 mg Oral TID   mometasone-formoterol  2 puff Inhalation BID   oxyCODONE  10 mg Oral Q12H   polyethylene glycol  17 g Oral Daily   pregabalin  75 mg Oral Daily   senna-docusate  1 tablet Oral BID   sodium chloride flush  10-40 mL Intracatheter Q12H   sodium chloride flush  3 mL Intravenous Q12H   Continuous Infusions:  DAPTOmycin (CUBICIN) 650 mg in sodium chloride 0.9 % IVPB 650 mg (03/05/23 1616)   Imaging and lab data was personally reviewed No results found.  LOS: 7 days   Marcellus Scott, MD,  FACP, [facility], [facility], [facility], [facility]   [facility] [place]     To contact the attending provider between 7A-7P or the covering provider during after hours 7P-7A, please log into the web site [email or web address] and access using universal [facility] password for that web site. If you do not have the password, please call the hospital operator.

## 2023-03-06 NOTE — Progress Notes (Signed)
Admit: 02/26/2023 LOS: 7  [name] [name] is a 67 year old female hospitalized for T spine abscess in setting of known osteomyelitis being treated with IV daptomycin. Pt has CKD V with baseline creatinine ~4.5 and follows with Dr. Ronalee Belts with [facility] but not seen since 06/2021. D/t intrinsic AKI, likely ATN, started HD with temp HD catheter placed.  Subjective:  Patient complains of worsening back pain due to sitting in the chair yesterday, otherwise states she is feeling well and in general better than when she was admitted  05/06 0701 - 05/07 0700 In: 303 [P.O.:240; IV Piggyback:63] Out: 800 [Urine:800]  Filed Weights   03/03/23 1714 03/05/23 0500 03/06/23 0514  Weight: 80.4 kg 80.2 kg 82.4 kg    Scheduled Meds:  acetaminophen  1,000 mg Oral TID   apixaban  2.5 mg Oral BID   calcium carbonate  1 tablet Oral Daily   carvedilol  6.25 mg Oral BID WC   Chlorhexidine Gluconate Cloth  6 each Topical Daily   Chlorhexidine Gluconate Cloth  6 each Topical Q0600   darbepoetin (ARANESP) injection - NON-DIALYSIS  40 mcg Subcutaneous Q Fri-1800   diclofenac Sodium  4 g Topical QID   DULoxetine  60 mg Oral Daily   lidocaine  2 patch Transdermal Q24H   methocarbamol  500 mg Oral TID   mometasone-formoterol  2 puff Inhalation BID   mupirocin ointment  1 Application Nasal BID   oxyCODONE  10 mg Oral Q12H   polyethylene glycol  17 g Oral Daily   pregabalin  75 mg Oral Daily   senna-docusate  1 tablet Oral BID   sodium chloride flush  10-40 mL Intracatheter Q12H   sodium chloride flush  3 mL Intravenous Q12H   Continuous Infusions:  DAPTOmycin (CUBICIN) 650 mg in sodium chloride 0.9 % IVPB 650 mg (03/05/23 1616)   PRN Meds:.albuterol, bisacodyl, diazepam, hydrALAZINE, HYDROmorphone (DILAUDID) injection, naLOXone (NARCAN)  injection, ondansetron (ZOFRAN) IV, oxyCODONE, polyethylene glycol  Current Labs: reviewed    Physical Exam:  Blood pressure (!) 158/66, pulse 63, temperature  98.6 F (37 C), resp. rate 17, height 4\' 11"  (1.499 m), weight 82.4 kg, SpO2 96 %. General: 67 year old female, lying in bed, NAD Cardio: RRR, normal S1/S2 Lungs: CTAB, normal effort Abdomen: Soft, nontender palpation, nondistended Extremities: No edema of BLEs  A AKI on CKD V with decreased urinary output: FENa of 2.6% supporting intrinsic AKI, likely ATN. Cr peaked at 8.03.  Patient had temporary HD catheter placed 03/02/2023 with HD on 5/3 and 5/4. Cr minimally up since yesterday: 3.62>3.79 today. 24 hr UOP 800 ml Metabolic acidosis in setting of CKD V : Sodium bicarbonate d/c'd - bicarb improved to 26 today. Anemia of CKD V: Hgb stable at 7.5 today and around baseline (~7.5-9). Iron studies with good saturation ratio and iron WNL.  Hypokalemia.  K of 3.3 today Diastolic CHF: patient does not appear to be fluid overloaded on exam and has history of HFpEF not HFrEF.  No indication for diuresis at this time.  Will hold home Lasix 20 mg BID.     P AKI on CKD V with decreased urinary output:              -Will continue to monitor kidney function and urinary output today, if labs and pt stable tomorrow can consider removing temporary HD catheter.              -Daily RFP Metabolic acidosis: Resolved, will CTM with AM RFP Diastolic CHF: Continue  to monitor I/Os and volume status and hold home lasix Hypokalemia: K repleted today Anemia: start aransep 40 mcg weekly Osteomyelitis/discitis:  per primary team and ID Other medical conditions: per primary team Medication Issues; Preferred narcotic agents for pain control are hydromorphone, fentanyl, and methadone. Morphine should not be used.  Baclofen should be avoided Avoid oral sodium phosphate and magnesium citrate based laxatives / bowel preps    Erick Alley, DO Family Medicine, PGY-2  Recent Labs  Lab 03/04/23 [phone] 03/05/23 0407 03/06/23 0329  NA 134* 136 135  K 3.1* 3.0* 3.3*  CL 101 100 101  CO2 25 26 26   GLUCOSE 89 75 84  BUN  22 22 24*  CREATININE 3.26* 3.62* 3.79*  CALCIUM 7.6* 7.5* 7.7*  PHOS 2.8 3.4 2.9   Recent Labs  Lab 03/04/23 0347 03/05/23 0407 03/06/23 0329  WBC 4.5 4.7 5.1  HGB 7.6* 7.2* 7.5*  HCT 23.3* 23.5* 24.7*  MCV 85.7 90.0 91.5  PLT 123* 121* 130*

## 2023-03-06 NOTE — Progress Notes (Signed)
PT Cancellation Note  Patient Details Name: Karlina Banken MRN: [id number] DOB: October 28, 1956   Cancelled Treatment:    Reason Eval/Treat Not Completed: Other (comment)  Pt just got pain meds and wants to sleep while comfortable.  Request PT to return later.  Will f/u at later time as able.  Anise Salvo, PT Acute Rehab [facility] Rehab [phone]  Rayetta Humphrey 03/06/2023, 2:39 PM

## 2023-03-07 ENCOUNTER — Other Ambulatory Visit: Payer: Self-pay | Admitting: Family Medicine

## 2023-03-07 DIAGNOSIS — M4624 Osteomyelitis of vertebra, thoracic region: Secondary | ICD-10-CM | POA: Diagnosis not present

## 2023-03-07 LAB — GLUCOSE, CAPILLARY
Glucose-Capillary: 86 mg/dL (ref 70–99)
Glucose-Capillary: 96 mg/dL (ref 70–99)
Glucose-Capillary: 97 mg/dL (ref 70–99)

## 2023-03-07 LAB — CBC
HCT: 24.7 % — ABNORMAL LOW (ref 36.0–46.0)
Hemoglobin: 7.5 g/dL — ABNORMAL LOW (ref 12.0–15.0)
MCH: 27.7 pg (ref 26.0–34.0)
MCHC: 30.4 g/dL (ref 30.0–36.0)
MCV: 91.1 fL (ref 80.0–100.0)
Platelets: 130 10*3/uL — ABNORMAL LOW (ref 150–400)
RBC: 2.71 MIL/uL — ABNORMAL LOW (ref 3.87–5.11)
RDW: 16 % — ABNORMAL HIGH (ref 11.5–15.5)
WBC: 4.8 10*3/uL (ref 4.0–10.5)
nRBC: 0 % (ref 0.0–0.2)

## 2023-03-07 LAB — RENAL FUNCTION PANEL
Albumin: 2.5 g/dL — ABNORMAL LOW (ref 3.5–5.0)
Anion gap: 8 (ref 5–15)
BUN: 24 mg/dL — ABNORMAL HIGH (ref 8–23)
CO2: 24 mmol/L (ref 22–32)
Calcium: 7.9 mg/dL — ABNORMAL LOW (ref 8.9–10.3)
Chloride: 103 mmol/L (ref 98–111)
Creatinine, Ser: 3.96 mg/dL — ABNORMAL HIGH (ref 0.44–1.00)
GFR, Estimated: 12 mL/min — ABNORMAL LOW (ref >60)
Glucose, Bld: 100 mg/dL — ABNORMAL HIGH (ref 70–99)
Phosphorus: 3 mg/dL (ref 2.5–4.6)
Potassium: 3.4 mmol/L — ABNORMAL LOW (ref 3.5–5.1)
Sodium: 135 mmol/L (ref 135–145)

## 2023-03-07 LAB — AEROBIC/ANAEROBIC CULTURE W GRAM STAIN (SURGICAL/DEEP WOUND): Culture: NO GROWTH

## 2023-03-07 MED ORDER — GLUCERNA SHAKE PO LIQD
237.0000 mL | Freq: Two times a day (BID) | ORAL | Status: DC
  Administered 2023-03-08 – 2023-03-10 (×5): 237 mL via ORAL

## 2023-03-07 MED ORDER — JUVEN PO PACK
1.0000 | PACK | Freq: Two times a day (BID) | ORAL | Status: DC
  Administered 2023-03-08 – 2023-03-13 (×10): 1 via ORAL
  Filled 2023-03-07 (×10): qty 1

## 2023-03-07 MED ORDER — LOPERAMIDE HCL 2 MG PO CAPS
4.0000 mg | ORAL_CAPSULE | Freq: Once | ORAL | Status: AC
  Administered 2023-03-07: 4 mg via ORAL
  Filled 2023-03-07: qty 2

## 2023-03-07 NOTE — Progress Notes (Signed)
Admit: 02/26/2023 LOS: 8  [name] [name] is a 67 year old female hospitalized for T spine abscess in setting of known osteomyelitis being treated with IV daptomycin. Pt has CKD V with baseline creatinine ~4.5 and follows with Dr. Ronalee Belts with [facility] but not seen since 06/2021. D/t intrinsic AKI, likely ATN, started HD with temp HD catheter placed.  Subjective:  Complains of back and left-sided pain that she has had since admission, also complaining of nausea this morning.  05/07 0701 - 05/08 0700 In: 750 [P.O.:720; I.V.:30] Out: 800 [Urine:800]  Filed Weights   03/05/23 0500 03/06/23 0514 03/07/23 0500  Weight: 80.2 kg 82.4 kg 77.7 kg    Scheduled Meds:  acetaminophen  1,000 mg Oral TID   apixaban  2.5 mg Oral BID   calcium carbonate  1 tablet Oral Daily   carvedilol  6.25 mg Oral BID WC   Chlorhexidine Gluconate Cloth  6 each Topical Daily   Chlorhexidine Gluconate Cloth  6 each Topical Q0600   darbepoetin (ARANESP) injection - NON-DIALYSIS  40 mcg Subcutaneous Q Fri-1800   diclofenac Sodium  4 g Topical QID   DULoxetine  60 mg Oral Daily   lidocaine  2 patch Transdermal Q24H   methocarbamol  500 mg Oral TID   mometasone-formoterol  2 puff Inhalation BID   oxyCODONE  10 mg Oral Q12H   polyethylene glycol  17 g Oral Daily   pregabalin  75 mg Oral Daily   senna-docusate  1 tablet Oral BID   sodium chloride flush  10-40 mL Intracatheter Q12H   sodium chloride flush  3 mL Intravenous Q12H   Continuous Infusions:  DAPTOmycin (CUBICIN) 650 mg in sodium chloride 0.9 % IVPB 650 mg (03/05/23 1616)   PRN Meds:.albuterol, bisacodyl, diazepam, hydrALAZINE, HYDROmorphone (DILAUDID) injection, naLOXone (NARCAN)  injection, ondansetron (ZOFRAN) IV, oxyCODONE, polyethylene glycol  Current Labs: reviewed    Physical Exam:  Blood pressure (!) 158/68, pulse 70, temperature 98.1 F (36.7 C), resp. rate 17, height 4\' 11"  (1.499 m), weight 77.7 kg, SpO2 95 %. General: 67 year old  female, lying in bed, NAD Cardiac: RRR, normal S1/S2 Lungs: CTAB, normal effort Abdomen: Soft, nondistended, nontender to palpation Extremities: No edema of BLEs  A AKI on CKD V with decreased urinary output: FENa of 2.6% supporting intrinsic AKI, likely ATN. Cr peaked at 8.03.  Patient had temporary HD catheter placed 03/02/2023 with HD on 5/3 and 5/4. Cr: 3.62>3.79>3.96 today. 24 hr UOP 800 ml.  Kidney function remains stable without HD.  Metabolic acidosis in setting of CKD V : Sodium bicarbonate d/c'd - bicarb improved to 24 today. Anemia of CKD V: Hgb stable at 7.5 today and around baseline (~7.5-9). Iron studies with good saturation ratio and iron WNL.  Hypokalemia.  K of 3.4 today Diastolic CHF: patient does not appear to be fluid overloaded on exam and has history of HFpEF not HFrEF.  No indication for diuresis at this time.  Will hold home Lasix 20 mg BID.     P AKI on CKD V with decreased urinary output:              -Kidney function remains stable today, can remove temporary HD catheter and monitor UOP and labs.  Nephrology will continue to follow.             -Daily RFP Metabolic acidosis: Resolved, will CTM with AM RFP Diastolic CHF: Continue to monitor I/Os and volume status and hold home lasix Hypokalemia: CTM Anemia: start aransep 40 mcg  weekly Osteomyelitis/discitis:  per primary team and ID Other medical conditions: per primary team Medication Issues; Preferred narcotic agents for pain control are hydromorphone, fentanyl, and methadone. Morphine should not be used.  Baclofen should be avoided Avoid oral sodium phosphate and magnesium citrate based laxatives / bowel preps    Erick Alley, DO Family Medicine, PGY-2  Recent Labs  Lab 03/05/23 0407 03/06/23 0329 03/07/23 0043  NA 136 135 135  K 3.0* 3.3* 3.4*  CL 100 101 103  CO2 26 26 24   GLUCOSE 75 84 100*  BUN 22 24* 24*  CREATININE 3.62* 3.79* 3.96*  CALCIUM 7.5* 7.7* 7.9*  PHOS 3.4 2.9 3.0   Recent Labs   Lab 03/05/23 0407 03/06/23 0329 03/07/23 0043  WBC 4.7 5.1 4.8  HGB 7.2* 7.5* 7.5*  HCT 23.5* 24.7* 24.7*  MCV 90.0 91.5 91.1  PLT 121* 130* 130*

## 2023-03-07 NOTE — Progress Notes (Incomplete)
Initial Nutrition Assessment  DOCUMENTATION CODES:   Obesity unspecified  INTERVENTION:  - Add Glucerna Shake po BID, each supplement provides 220 kcal and 10 grams of protein  - Add -1 packet Juven BID, each packet provides 95 calories, 2.5 grams of protein (collagen), and 9.8 grams of carbohydrate (3 grams sugar); also contains 7 grams of L-arginine and L-glutamine, 300 mg vitamin C, 15 mg vitamin E, 1.2 mcg vitamin B-12, 9.5 mg zinc, 200 mg calcium, and 1.5 g  Calcium Beta-hydroxy-Beta-methylbutyrate to support wound healing   NUTRITION DIAGNOSIS:   Increased nutrient needs related to wound healing as evidenced by estimated needs.  GOAL:   Patient will meet greater than or equal to 90% of their needs  MONITOR:   PO intake, Supplement acceptance  REASON FOR ASSESSMENT:   Malnutrition Screening Tool    ASSESSMENT:   67 y.o. female admits related to abdominal pain, back pain, nausea, and vomiting. PMH includes: acute MI, anemia, CAD, cirrhosis, DM, HTN, HLD, MRSA. Pt is currently receiving medical management related to acute osteomyelitis of thoracic spine.  Meds reviewed: miralax, senokot. Labs reviewed: K low, BUN/Creatinine elevated.   RD attempted to call pt's room but no answer. Per record, pt ate 50-100% of her meals today. Intakes have varied since admit from 20-100%. Pt with increased nutrient needs related to wound healing. RD will add Juven BID and Glucerna BID to promote wound healing. Will continue to monitor PO intakes.   NUTRITION - FOCUSED PHYSICAL EXAM:  RD working remotely, attempt at follow up.   Diet Order:   Diet Order             Diet heart healthy/carb modified Room service appropriate? Yes; Fluid consistency: Thin  Diet effective now                   EDUCATION NEEDS:   Not appropriate for education at this time  Skin:  Skin Assessment: Skin Integrity Issues: Skin Integrity Issues:: Other (Comment) Other: pressure injury to L  heel  Last BM:  5/9 - type 7  Height:   Ht Readings from Last 1 Encounters:  02/26/23 4\' 11"  (1.499 m)    Weight:   Wt Readings from Last 1 Encounters:  03/08/23 80.1 kg    Ideal Body Weight:     BMI:  Body mass index is 35.67 kg/m.  Estimated Nutritional Needs:   Kcal:  1550-1945 kcals  Protein:  75-100 gm  Fluid:  >/= 1.5 L  Bethann Humble, RD, LDN, CNSC.

## 2023-03-07 NOTE — Progress Notes (Signed)
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 Back surgery 2021 with screws placed complicated by discitis at T10-11, T4-T5 as well as corpectomy L1 previously staph epi as well as Pseudomonas completing daptomycin 08/09/2022  Recent hospitalization 3/31 through 4/7 with osteomyelitis discitis thoracic spine-neurosurgery at the time did not feel she would be a candidate and she grew MRSE and was started on daptomycin Placed and was due to complete Rx 5/31  Represented 4/29 with worsening back pain nausea vomiting Bicarb 16--BUN/creatinine 53/4.6--Hemoglobin 9 platelet 142--iNR 1.3 Lactic acid normal Ammonia normal Blood cultures pending CT ABD chest pelvis = T4-T5 osteomyelitis discitis with continued paraspinal soft tissue density MRI = new osteomyelitis T3 with vertebral collapse T4 since prior MRI 12/2022 5/2 discussion with Dr. Maisie Fus neurosurgery recommends medical management increase in size of suspected dorsal epidural fluid collection 7 mm 5/3 underwent disc aspiration--end of treatment 04/13/2023 follow-up Dr. Judie Petit. 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, T5 +7 mm abscess/phlegmon negative blood culture 4/29 Biopsy results   [-] no growth to date from aspiration Per ID-complete IV daptomycin 04/13/2023, OP F/u 04/12/2023 Discontinue IV Dilaudid, continue Oxy IR 5-10 every 6 as needed  moderate pain, continue OxyContin 10 every 12-caution with renal insufficiency, continue Lyrica cautiously 75 twice daily  ATN/AKI superimposed on CKD 4 nongap acidosis followed previously by nephrology until 06/2021 Initial creatinine 8 on admission Multifactorial components with urinary retention/hypotension and various other insults Temp HD cath placed and underwent HD on 5/3, 5/4 with good output improving creatinine Defer to Dr. Melanee Spry who has elected to remove temp HD cath on left side--defer bicarb replacement to renal Appreciate input  Diarrhea? On antibiotics-prior C. difficile-watch closely and if spikes fever and/or profuse diarrhea may need to follow Discontinued multiple laxatives One-time dose Imodium 4 mg given  Hypokalemia Careful replacement would not replace today recheck labs instead a.m.  LLE wound Will review wound personally a.m. 5/9  Chronic thrombocytopenia with platelets of 130 Last CT abdomen pelvis performed 05/2022 shows cirrhotic liver borderline splenomegaly Trend LFTs x 1 in a.m. 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  HFpEF last echo poor study with poor acoustic windows EF 65-70% HTN Lasix 20 twice daily held, continuing Coreg 6.25 twice daily--titrate as able  Chronic lower extremity DVTs currently on apixaban 2.5 twice daily which will need to be continued  Depression/anxiety continue Cymbalta 60 daily, diazepam 5 every 12 as needed anxiety  DVT prophylaxis: Apixaban Code Status: Full Family Communication:  Disposition:  Status is: Inpatient Remains inpatient appropriate because:   Await resolution and steady state of kidney function   Subjective: Awake coherent no distress Looks relatively comfortable--- tells me has  had diarrhea 3 times since yesterday and reports a history of prior C. difficile-describes the diarrhea as watery  unformed Tells me pain is manageable Eating and drinking No fever  Objective: Vitals:   03/06/23 2044 03/07/23 0300 03/07/23 0500 03/07/23 0803  BP: (!) 161/64 (!) 158/68  (!) 158/61  Pulse: 65 70  63  Resp: 16 17  16   Temp: 97.9 F (36.6 C) 98.1 F (36.7 C)  (!) 97.3 F (36.3 C)  TempSrc: Oral   Oral  SpO2: 95% 95%  98%  Weight:   77.7 kg   Height:        Intake/Output Summary (Last 24 hours) at 03/07/2023 0811 Last data filed at 03/07/2023 0542 Gross per 24 hour  Intake 750 ml  Output 800 ml  Net -50 ml   Filed Weights   03/05/23 0500 03/06/23 0514 03/07/23 0500  Weight: 80.2 kg 82.4 kg 77.7 kg    Examination:  EOMI NCAT no focal deficit, thick neck Mallampati 4 Chest is clear no wheeze rales rhonchi ROM intact CTAB no added sound S1-S2 no murmur Abdomen obese nontender Left lower extremity is wrapped in a gauze which I did not disturb  Data Reviewed: personally reviewed   CBC    Component Value Date/Time   WBC 4.8 03/07/2023 0043   RBC 2.71 (L) 03/07/2023 0043   HGB 7.5 (L) 03/07/2023 0043   HGB 8.3 (L) 01/12/2023 1419   HGB 15.0 11/30/2008 0927   HCT 24.7 (L) 03/07/2023 0043   HCT 43.8 11/30/2008 0927   PLT 130 (L) 03/07/2023 0043   PLT 120 (L) 01/12/2023 1419   PLT 133 (L) 11/30/2008 0927   MCV 91.1 03/07/2023 0043   MCV 83.9 11/30/2008 0927   MCH 27.7 03/07/2023 0043   MCHC 30.4 03/07/2023 0043   RDW 16.0 (H) 03/07/2023 0043   RDW 13.0 11/30/2008 0927   LYMPHSABS 0.6 (L) 02/26/2023 0825   LYMPHSABS 1.2 11/30/2008 0927   MONOABS 0.2 02/26/2023 0825   MONOABS 0.3 11/30/2008 0927   EOSABS 0.1 02/26/2023 0825   EOSABS 0.1 11/30/2008 0927   BASOSABS 0.0 02/26/2023 0825   BASOSABS 0.0 11/30/2008 0927      Latest Ref Rng & Units 03/07/2023   12:43 AM 03/06/2023    3:29 AM 03/05/2023    4:07 AM  CMP  Glucose 70 - 99 mg/dL 161  84  75    BUN 8 - 23 mg/dL 24  24  22    Creatinine 0.44 - 1.00 mg/dL 0.96  0.45  4.09   Sodium 135 - 145 mmol/L 135  135  136   Potassium 3.5 - 5.1 mmol/L 3.4  3.3  3.0   Chloride 98 - 111 mmol/L 103  101  100   CO2 22 - 32 mmol/L 24  26  26    Calcium 8.9 - 10.3 mg/dL 7.9  7.7  7.5      Radiology Studies: No results found.   Scheduled Meds:  acetaminophen  1,000 mg Oral TID   apixaban  2.5 mg Oral BID   calcium carbonate  1 tablet Oral Daily   carvedilol  6.25 mg Oral BID WC   Chlorhexidine Gluconate Cloth  6 each Topical Daily   Chlorhexidine Gluconate Cloth  6 each Topical Q0600   darbepoetin (ARANESP) injection - NON-DIALYSIS  40 mcg Subcutaneous Q Fri-1800   diclofenac Sodium  4 g Topical QID   DULoxetine  60 mg Oral Daily   [START ON 03/08/2023] feeding supplement (GLUCERNA SHAKE)  237 mL Oral BID BM   lidocaine  2 patch Transdermal Q24H   methocarbamol  500 mg Oral TID   mometasone-formoterol  2 puff Inhalation BID   [START ON 03/08/2023] nutrition supplement (JUVEN)  1 packet Oral BID BM   oxyCODONE  10 mg Oral Q12H   pregabalin  75 mg Oral Daily   sodium chloride flush  10-40 mL Intracatheter Q12H   sodium chloride flush  3 mL Intravenous Q12H   Continuous Infusions:  DAPTOmycin (CUBICIN) 650 mg in sodium chloride 0.9 % IVPB 650 mg (03/05/23 1616)     LOS: 8 days   Time spent: 87  Rhetta Mura, MD [facility] To contact the attending provider between 7A-7P or the covering provider during after hours 7P-7A, please log into the web site [email or web address] and access using universal [place] password for that web site. If you do not have the password, please call the hospital operator.  03/07/2023, 8:11 AM

## 2023-03-08 ENCOUNTER — Ambulatory Visit: Payer: PRIVATE HEALTH INSURANCE | Admitting: Internal Medicine

## 2023-03-08 ENCOUNTER — Encounter: Admit: 2023-03-08 | Discharge: 2023-03-08 | Payer: MEDICARE

## 2023-03-08 DIAGNOSIS — M4624 Osteomyelitis of vertebra, thoracic region: Secondary | ICD-10-CM | POA: Diagnosis not present

## 2023-03-08 DIAGNOSIS — Z1231 Encounter for screening mammogram for malignant neoplasm of breast: Secondary | ICD-10-CM

## 2023-03-08 LAB — HEPATIC FUNCTION PANEL
ALT: 9 U/L (ref 0–44)
AST: 16 U/L (ref 15–41)
Albumin: 2.7 g/dL — ABNORMAL LOW (ref 3.5–5.0)
Alkaline Phosphatase: 170 U/L — ABNORMAL HIGH (ref 38–126)
Bilirubin, Direct: <0.1 mg/dL (ref 0.0–0.2)
Total Bilirubin: 0.9 mg/dL (ref 0.3–1.2)
Total Protein: 7 g/dL (ref 6.5–8.1)

## 2023-03-08 LAB — RENAL FUNCTION PANEL
Albumin: 2.6 g/dL — ABNORMAL LOW (ref 3.5–5.0)
Anion gap: 7 (ref 5–15)
BUN: 27 mg/dL — ABNORMAL HIGH (ref 8–23)
CO2: 25 mmol/L (ref 22–32)
Calcium: 7.9 mg/dL — ABNORMAL LOW (ref 8.9–10.3)
Chloride: 104 mmol/L (ref 98–111)
Creatinine, Ser: 4.63 mg/dL — ABNORMAL HIGH (ref 0.44–1.00)
GFR, Estimated: 10 mL/min — ABNORMAL LOW (ref >60)
Glucose, Bld: 126 mg/dL — ABNORMAL HIGH (ref 70–99)
Phosphorus: 3 mg/dL (ref 2.5–4.6)
Potassium: 4.2 mmol/L (ref 3.5–5.1)
Sodium: 136 mmol/L (ref 135–145)

## 2023-03-08 LAB — GLUCOSE, CAPILLARY
Glucose-Capillary: 86 mg/dL (ref 70–99)
Glucose-Capillary: 91 mg/dL (ref 70–99)
Glucose-Capillary: 92 mg/dL (ref 70–99)
Glucose-Capillary: 142 mg/dL — ABNORMAL HIGH (ref 70–99)

## 2023-03-08 LAB — CBC
HCT: 24.9 % — ABNORMAL LOW (ref 36.0–46.0)
Hemoglobin: 7.5 g/dL — ABNORMAL LOW (ref 12.0–15.0)
MCH: 27.8 pg (ref 26.0–34.0)
MCHC: 30.1 g/dL (ref 30.0–36.0)
MCV: 92.2 fL (ref 80.0–100.0)
Platelets: 126 10*3/uL — ABNORMAL LOW (ref 150–400)
RBC: 2.7 MIL/uL — ABNORMAL LOW (ref 3.87–5.11)
RDW: 16.4 % — ABNORMAL HIGH (ref 11.5–15.5)
WBC: 5.7 10*3/uL (ref 4.0–10.5)
nRBC: 0.3 % — ABNORMAL HIGH (ref 0.0–0.2)

## 2023-03-08 MED ORDER — METHOCARBAMOL 750 MG PO TABS
750.0000 mg | ORAL_TABLET | Freq: Three times a day (TID) | ORAL | Status: DC
  Administered 2023-03-08 – 2023-03-29 (×57): 750 mg via ORAL
  Filled 2023-03-08 (×60): qty 1

## 2023-03-08 MED ORDER — LOPERAMIDE HCL 2 MG PO CAPS
4.0000 mg | ORAL_CAPSULE | Freq: Once | ORAL | Status: AC
  Administered 2023-03-08: 4 mg via ORAL
  Filled 2023-03-08: qty 2

## 2023-03-08 NOTE — Progress Notes (Signed)
Occupational Therapy Treatment Patient Details Name: [name] [name] MRN: [id number] DOB: 11/04/1955 Today's Date: 03/08/2023   History of present illness Pt is 67 year old who is moslty wheelchair bound and has CKD stage V, hypertension, cirrhosis of the liver, OSA, morbid obesity, L1 osteomyelitis status post L1 corpectomy, T10-T11 discitis osteomyelitis status post posterior arthrodesis of T8-T11 and removal of T11 pedicle screws and rod in 12/23.  Last month, diagnosed with T4-T5 discitis osteomyelitis which was being treated with daptomycin.  Neurosurgery did not recommend any further interventions.     The patient returns to the hospital for worsening back pain for [redacted] week along with an episode of vomiting.  Pt found to have AKI requiring 2 cycles of HD.  Also, with ongoing osteomyelitis/discitis Thoracic spine with T4-5 disc aspiration by IR on 5/3.  Neurosurgery recommends further medical management, no surgical intervention at this time.   OT comments  Pt in a lot of pain today, states 10/10 while lying down in bed. Pt stated that she wasn't allowed to move because nursing advised against OOB activities, nurse and doctor stated she was not told that. Pt advised to perform OOB activities, refused. Pt agreed to in bed BUE exercises with theraband, instructed on BUE exercises, refused to complete full sets, but able to perform with adequate technique, further instruction needed. Pt would benefit greatly from continued skilled acute therapy.    Recommendations for follow up therapy are one component of a multi-disciplinary discharge planning process, led by the attending physician.  Recommendations may be updated based on patient status, additional functional criteria and insurance authorization.    Assistance Recommended at Discharge Frequent or constant Supervision/Assistance  Patient can return home with the following  Help with stairs or ramp for entrance;Assist for transportation;Assistance  with cooking/housework;A lot of help with bathing/dressing/bathroom;A lot of help with walking and/or transfers   Equipment Recommendations  None recommended by OT    Recommendations for Other Services      Precautions / Restrictions Precautions Precautions: Back Precaution Booklet Issued: No Required Braces or Orthoses: Spinal Brace Spinal Brace: Thoracolumbosacral orthotic Spinal Brace Comments: No orders but pt had from prior admission and wears when OOB Restrictions Weight Bearing Restrictions: No       Mobility Bed Mobility Overal bed mobility: Needs Assistance Bed Mobility: Rolling, Sidelying to Sit, Sit to Sidelying Rolling: Supervision         General bed mobility comments: supervision for reminder of back precautions, Pt impulsive and attempts to twist in bed, refused to sit on EOB today    Transfers Overall transfer level: Needs assistance                 General transfer comment: refused transfers     Balance                                           ADL either performed or assessed with clinical judgement   ADL Overall ADL's : Needs assistance/impaired                                            Extremity/Trunk Assessment Upper Extremity Assessment Upper Extremity Assessment: RUE deficits/detail;LUE deficits/detail RUE Deficits / Details: unable to fully extend R elbow due to hx of fx LUE Deficits /  Details: decreased shoudler ROM            Vision       Perception     Praxis      Cognition Arousal/Alertness: Awake/alert Behavior During Therapy: Anxious Overall Cognitive Status: Within Functional Limits for tasks assessed                                 General Comments: did not want to get OOB due to fear of pain, doctor and nurse entered room, continues to refuse OOB activities        Exercises Exercises: General Upper Extremity General Exercises - Upper Extremity Shoulder  Flexion: AROM, Both, 20 reps, Supine, Theraband Theraband Level (Shoulder Flexion): Level 2 (Red) Shoulder Horizontal ABduction: AROM, Both, 20 reps, Theraband Theraband Level (Shoulder Horizontal Abduction): Level 2 (Red) Elbow Flexion: AROM, Both, 20 reps, Theraband Theraband Level (Elbow Flexion): Level 2 (Red) Elbow Extension: AROM, Both, 20 reps, Theraband Theraband Level (Elbow Extension): Level 2 (Red)    Shoulder Instructions       General Comments      Pertinent Vitals/ Pain       Pain Assessment Pain Assessment: 0-10 Pain Score: 10-Worst pain ever Faces Pain Scale: Hurts little more Pain Location: back Pain Descriptors / Indicators: Grimacing, Guarding Pain Intervention(s): Limited activity within patient's tolerance, Monitored during session, Premedicated before session  Home Living                                          Prior Functioning/Environment              Frequency  Min 2X/week        Progress Toward Goals  OT Goals(current goals can now be found in the care plan section)  Progress towards OT goals: Progressing toward goals  Acute Rehab OT Goals Patient Stated Goal: to return home OT Goal Formulation: With patient Time For Goal Achievement: 03/19/23 Potential to Achieve Goals: Good ADL Goals Pt Will Perform Grooming: with set-up;sitting Pt Will Transfer to Toilet: with supervision;regular height toilet;stand pivot transfer;bedside commode  Plan Discharge plan remains appropriate    Co-evaluation                 AM-PAC OT "6 Clicks" Daily Activity     Outcome Measure   Help from another person eating meals?: None Help from another person taking care of personal grooming?: None Help from another person toileting, which includes using toliet, bedpan, or urinal?: A Lot Help from another person bathing (including washing, rinsing, drying)?: A Lot Help from another person to put on and taking off regular upper  body clothing?: A Lot Help from another person to put on and taking off regular lower body clothing?: A Lot 6 Click Score: 16    End of Session    OT Visit Diagnosis: Unsteadiness on feet (R26.81)   Activity Tolerance Patient limited by pain;Treatment limited secondary to agitation   Patient Left in bed;with bed alarm set;with call bell/phone within reach   Nurse Communication Mobility status        Time: 1610-9604 OT Time Calculation (min): 18 min  Charges: OT General Charges $OT Visit: 1 Visit OT Treatments $Therapeutic Exercise: 8-22 mins  Cassandre Oleksy, OTR/L   Tarry Blayney R Abriella Filkins 03/08/2023, 3:00 PM

## 2023-03-08 NOTE — Progress Notes (Signed)
Admit: 02/26/2023 LOS: 9  [name] [name] is a 67 year old female hospitalized for T spine abscess in setting of known osteomyelitis being treated with IV daptomycin. Pt has CKD V with baseline creatinine ~4.5 and follows with Dr. Ronalee Belts with [facility] but not seen since 06/2021. D/t intrinsic AKI, likely ATN, started HD with temp HD catheter placed 5/3 and removed 5/8.  Subjective:  Patient continues to complain of right-sided pain which she has been experiencing since admission.  Otherwise is feeling well.  05/08 0701 - 05/09 0700 In: -  Out: 450 [Urine:450]  Filed Weights   03/06/23 0514 03/07/23 0500 03/08/23 0517  Weight: 82.4 kg 77.7 kg 80.1 kg    Scheduled Meds:  acetaminophen  1,000 mg Oral TID   apixaban  2.5 mg Oral BID   calcium carbonate  1 tablet Oral Daily   carvedilol  6.25 mg Oral BID WC   Chlorhexidine Gluconate Cloth  6 each Topical Daily   Chlorhexidine Gluconate Cloth  6 each Topical Q0600   darbepoetin (ARANESP) injection - NON-DIALYSIS  40 mcg Subcutaneous Q Fri-1800   diclofenac Sodium  4 g Topical QID   DULoxetine  60 mg Oral Daily   feeding supplement (GLUCERNA SHAKE)  237 mL Oral BID BM   lidocaine  2 patch Transdermal Q24H   methocarbamol  500 mg Oral TID   mometasone-formoterol  2 puff Inhalation BID   nutrition supplement (JUVEN)  1 packet Oral BID BM   oxyCODONE  10 mg Oral Q12H   pregabalin  75 mg Oral Daily   sodium chloride flush  10-40 mL Intracatheter Q12H   sodium chloride flush  3 mL Intravenous Q12H   Continuous Infusions:  DAPTOmycin (CUBICIN) 650 mg in sodium chloride 0.9 % IVPB 650 mg (03/07/23 1837)   PRN Meds:.albuterol, diazepam, hydrALAZINE, naLOXone (NARCAN)  injection, ondansetron (ZOFRAN) IV, oxyCODONE  Current Labs: reviewed    Physical Exam:  Blood pressure 128/60, pulse 64, temperature 97.8 F (36.6 C), temperature source Oral, resp. rate 18, height 4\' 11"  (1.499 m), weight 80.1 kg, SpO2 92 %. General: 67 year old  female, lying in bed chatting with visitor, NAD Cardio: RRR, normal S1/S2 Lungs: CTAB, normal effort Abdomen: Soft, mildly diffuse tenderness to palpation with no guarding, nondistended Extremities: No edema of BLEs  A AKI on CKD V with decreased urinary output: FENa of 2.6% supporting intrinsic AKI, likely ATN. Cr peaked at 8.03.  Patient had temporary HD catheter placed 03/02/2023 with HD on 5/3 and 5/4. HD catheter removed on 5/8. Cr up trending slightly w/out HD: 3.62>3.79>3.96>4.63 today - now around baseline of ~4.5.  Documented 24-hour UOP of 450 mL, patient states she is not drinking much because she would like more cold water. Metabolic acidosis in setting of CKD V : Sodium bicarbonate d/c'd 5/6 - bicarb WNL to 25 today. Anemia of CKD V: Hgb stable at 7.5 today and around baseline (~7.5-9). Iron studies with good saturation ratio and iron WNL.  Hypokalemia.  Resolved - K of 4.2 today Diastolic CHF: patient does not appear to be fluid overloaded on exam and has history of HFpEF not HFrEF.  No indication for diuresis at this time.  Will hold home Lasix 20 mg BID.     P AKI on CKD V with decreased urinary output:              -No plans for HD at this time.  Encourage p.o. intake.  Nephrology will continue to follow.             -  Daily RFP  -stricti I/Os Metabolic acidosis: Resolved, will CTM with AM RFP Diastolic CHF: Continue to monitor I/Os and volume status and hold home lasix Hypokalemia: CTM Anemia: start aransep 40 mcg weekly Osteomyelitis/discitis:  per primary team and ID Other medical conditions: per primary team Medication Issues; Preferred narcotic agents for pain control are hydromorphone, fentanyl, and methadone. Morphine should not be used.  Baclofen should be avoided Avoid oral sodium phosphate and magnesium citrate based laxatives / bowel preps    Erick Alley, DO Family Medicine, PGY-2  Recent Labs  Lab 03/06/23 0329 03/07/23 0043 03/08/23 0033  NA 135 135  136  K 3.3* 3.4* 4.2  CL 101 103 104  CO2 26 24 25   GLUCOSE 84 100* 126*  BUN 24* 24* 27*  CREATININE 3.79* 3.96* 4.63*  CALCIUM 7.7* 7.9* 7.9*  PHOS 2.9 3.0 3.0   Recent Labs  Lab 03/06/23 0329 03/07/23 0043 03/08/23 0033  WBC 5.1 4.8 5.7  HGB 7.5* 7.5* 7.5*  HCT 24.7* 24.7* 24.9*  MCV 91.5 91.1 92.2  PLT 130* 130* 126*

## 2023-03-08 NOTE — Progress Notes (Signed)
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 Back surgery 2021 with screws placed complicated by discitis at T10-11, T4-T5 as well as corpectomy L1 previously staph epi as well as Pseudomonas completing daptomycin 08/09/2022  Recent hospitalization 3/31 through 4/7 with osteomyelitis discitis thoracic spine-neurosurgery at the time did not feel she would be a candidate and she grew MRSE and was started on daptomycin Placed and was due to complete Rx 5/31  Represented 4/29 with worsening back pain nausea vomiting Bicarb 16--BUN/creatinine 53/4.6--Hemoglobin 9 platelet 142--iNR 1.3 Lactic acid normal Ammonia normal Blood cultures pending CT ABD chest pelvis = T4-T5 osteomyelitis discitis with continued paraspinal soft tissue density MRI = new osteomyelitis T3 with vertebral collapse T4 since prior MRI 12/2022 5/2 discussion with Dr. Maisie Fus neurosurgery recommends medical management increase in size of suspected dorsal epidural fluid collection 7 mm 5/3 underwent disc aspiration--end of treatment 04/13/2023 follow-up Dr. Judie Petit. 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, T5 +7 mm abscess/phlegmon negative blood culture 4/29 Biopsy results   [-] no growth to date from aspiration Per ID-complete IV daptomycin 04/13/2023, OP F/u 04/12/2023 IV Dilaudid DC 5/8 continue Oxy IR 5-10 every 6 as needed moderate  pain, continue OxyContin 10 every 12-continue Lyrica cautiously 75 twice daily Augment Robaxin to 750 3 times daily given spasm and pain  ATN/AKI superimposed on CKD 4 nongap acidosis followed previously by nephrology until 06/2021 Initial creatinine 8 on admission Multifactorial components with urinary retention/hypotension and various other insults Temp HD cath placed and underwent HD on 5/3, 5/4 with good output improving creatinine therefore temp cath removed 5/8 Renal continues to follow and will determine if needs further HD-is nonoliguric passing net -3 L today and I will remove Foley catheter to aid with ambulation  Diarrhea 1 episode on 5/8 and another on 5/9 On antibiotics-prior C. difficile-since only 1 episode daily would watch closely but can administer one-time Imodium 4 mg given  Hypokalemia Daily labs monitor  LLE wound Exam as below looks clean, continue dressings with wet-to-dry's will ask wound nurse to see not emergently but may just need Aquacel  Chronic thrombocytopenia with platelets of 130 Last CT abdomen pelvis performed 05/2022 shows cirrhotic liver borderline splenomegaly Mild elevation alk phos transaminases are normal-no further workup at this stage  Normocytic anemia Has not met transfusion threshold, iron studies 5/3 did not show any deficiency and saturations are good-only monitor some component of iatrogenic blood draws causing this  DM TY 2 with hypoglycemia A1c 4.6 Discontinued all coverage-blood sugars have persistently been improved since 5/6 patient is eating 50 to 100% of meals 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  HFpEF last echo poor study with poor acoustic windows EF 65-70% HTN Lasix 20 twice daily held, continuing Coreg 6.25 twice daily--titrate as able  Chronic lower extremity DVTs (unclear when) currently on apixaban 2.5 twice daily  Imaging from back in 2017 does not suggest any DVT-I will ask patient questions on next eval but may  consider discontinuation in the outpatient setting --watch platelets carefully  Depression/anxiety continue Cymbalta 60 daily, diazepam 5 every 12 as needed anxiety  DVT prophylaxis: Apixaban Code Status: Full Family Communication: None present currently Disposition:  Status is: Inpatient Remains inpatient appropriate because:   Await resolution and steady state of kidney function prior to discontinuation   Subjective:  Pleasant coherent but does not want to get out of bed with therapy no chest pain no fever no nausea States that pain is wrenching in the back and is asking for an increase in the muscle relaxants that she has Did not sleep all night also  Objective: Vitals:   03/07/23 2046 03/08/23 0517 03/08/23 0802 03/08/23 0806  BP: (!) 174/66 128/60 (!) 169/64   Pulse: 72 64 63   Resp: 18 18 17 18   Temp: (!) 97.5 F (36.4 C) 97.8 F (36.6 C) 97.6 F (36.4 C)   TempSrc: Oral Oral    SpO2: 92% 92% 92% 92%  Weight:  80.1 kg    Height:        Intake/Output Summary (Last 24 hours) at 03/08/2023 1500 Last data filed at 03/08/2023 1400 Gross per 24 hour  Intake 440 ml  Output 450 ml  Net -10 ml    Filed Weights   03/06/23 0514 03/07/23 0500 03/08/23 0517  Weight: 82.4 kg 77.7 kg 80.1 kg    Examination:  Coherent no distress Chest is clear no wheeze S1-S2 no murmur Left-sided IJ has been removed TDC in right chest present No lower extremity edema Foot exam on left foot as below   Data Reviewed: personally reviewed   CBC    Component Value Date/Time   WBC 5.7 03/08/2023 0033   RBC 2.70 (L) 03/08/2023 0033   HGB 7.5 (L) 03/08/2023 0033   HGB 8.3 (L) 01/12/2023 1419   HGB 15.0 11/30/2008 0927   HCT 24.9 (L) 03/08/2023 0033   HCT 43.8 11/30/2008 0927   PLT 126 (L) 03/08/2023 0033   PLT 120 (L) 01/12/2023 1419   PLT 133 (L) 11/30/2008 0927   MCV 92.2 03/08/2023 0033   MCV 83.9 11/30/2008 0927   MCH 27.8 03/08/2023 0033   MCHC 30.1 03/08/2023 0033    RDW 16.4 (H) 03/08/2023 0033   RDW 13.0 11/30/2008 0927   LYMPHSABS 0.6 (L) 02/26/2023 0825   LYMPHSABS 1.2 11/30/2008 0927   MONOABS 0.2 02/26/2023 0825   MONOABS 0.3 11/30/2008 0927   EOSABS 0.1 02/26/2023 0825   EOSABS 0.1 11/30/2008 0927   BASOSABS 0.0 02/26/2023 0825   BASOSABS 0.0 11/30/2008 0927      Latest Ref Rng & Units 03/08/2023    8:56 AM 03/08/2023   12:33 AM 03/07/2023   12:43 AM  CMP  Glucose 70 - 99 mg/dL  161  096   BUN 8 - 23 mg/dL  27  24   Creatinine 0.45 - 1.00 mg/dL  4.09  8.11   Sodium 914 - 145 mmol/L  136  135   Potassium 3.5 - 5.1 mmol/L  4.2  3.4   Chloride 98 - 111 mmol/L  104  103   CO2 22 - 32 mmol/L  25  24   Calcium 8.9 - 10.3 mg/dL  7.9  7.9   Total Protein 6.5 - 8.1 g/dL 7.0     Total Bilirubin 0.3 - 1.2 mg/dL 0.9     Alkaline Phos 38 - 126 U/L 170     AST 15 - 41 U/L 16  ALT 0 - 44 U/L 9        Radiology Studies: No results found.   Scheduled Meds:  acetaminophen  1,000 mg Oral TID   apixaban  2.5 mg Oral BID   calcium carbonate  1 tablet Oral Daily   carvedilol  6.25 mg Oral BID WC   Chlorhexidine Gluconate Cloth  6 each Topical Daily   Chlorhexidine Gluconate Cloth  6 each Topical Q0600   darbepoetin (ARANESP) injection - NON-DIALYSIS  40 mcg Subcutaneous Q Fri-1800   diclofenac Sodium  4 g Topical QID   DULoxetine  60 mg Oral Daily   feeding supplement (GLUCERNA SHAKE)  237 mL Oral BID BM   lidocaine  2 patch Transdermal Q24H   methocarbamol  500 mg Oral TID   mometasone-formoterol  2 puff Inhalation BID   nutrition supplement (JUVEN)  1 packet Oral BID BM   oxyCODONE  10 mg Oral Q12H   pregabalin  75 mg Oral Daily   sodium chloride flush  10-40 mL Intracatheter Q12H   sodium chloride flush  3 mL Intravenous Q12H   Continuous Infusions:  DAPTOmycin (CUBICIN) 650 mg in sodium chloride 0.9 % IVPB 650 mg (03/07/23 1837)     LOS: 9 days   Time spent: 55  Rhetta Mura, MD [facility] To contact the  attending provider between 7A-7P or the covering provider during after hours 7P-7A, please log into the web site [email or web address] and access using universal [place] password for that web site. If you do not have the password, please call the hospital operator.  03/08/2023, 3:00 PM

## 2023-03-08 NOTE — Progress Notes
Results noted benign 

## 2023-03-08 NOTE — Progress Notes (Signed)
PT Cancellation Note  Patient Details Name: [name] [name] MRN: [id number] DOB: [birth year]   Cancelled Treatment:    Reason Eval/Treat Not Completed:  (2 attempts made. Patient declined due to fatigue and not feeling well today. She is requesting for PT to follow up tomorrow.)  [name] [name], PT, MPT   Ina Homes 03/08/2023, 2:30 PM

## 2023-03-08 NOTE — Plan of Care (Signed)

## 2023-03-09 ENCOUNTER — Encounter: Admit: 2023-03-09 | Discharge: 2023-03-09 | Payer: MEDICARE

## 2023-03-09 DIAGNOSIS — M4624 Osteomyelitis of vertebra, thoracic region: Secondary | ICD-10-CM | POA: Diagnosis not present

## 2023-03-09 LAB — RENAL FUNCTION PANEL
Albumin: 2.7 g/dL — ABNORMAL LOW (ref 3.5–5.0)
Anion gap: 7 (ref 5–15)
BUN: 39 mg/dL — ABNORMAL HIGH (ref 8–23)
CO2: 25 mmol/L (ref 22–32)
Calcium: 8 mg/dL — ABNORMAL LOW (ref 8.9–10.3)
Chloride: 102 mmol/L (ref 98–111)
Creatinine, Ser: 4.89 mg/dL — ABNORMAL HIGH (ref 0.44–1.00)
GFR, Estimated: 9 mL/min — ABNORMAL LOW (ref >60)
Glucose, Bld: 106 mg/dL — ABNORMAL HIGH (ref 70–99)
Phosphorus: 4.1 mg/dL (ref 2.5–4.6)
Potassium: 4.2 mmol/L (ref 3.5–5.1)
Sodium: 134 mmol/L — ABNORMAL LOW (ref 135–145)

## 2023-03-09 LAB — GLUCOSE, CAPILLARY
Glucose-Capillary: 92 mg/dL (ref 70–99)
Glucose-Capillary: 101 mg/dL — ABNORMAL HIGH (ref 70–99)
Glucose-Capillary: 157 mg/dL — ABNORMAL HIGH (ref 70–99)
Glucose-Capillary: 72 mg/dL (ref 70–99)

## 2023-03-09 MED ORDER — IRBESARTAN 150 MG PO TAB
150 mg | ORAL_TABLET | Freq: Every evening | ORAL | 0 refills | Status: AC
Start: 2023-03-09 — End: ?

## 2023-03-09 NOTE — Progress Notes (Signed)
   03/09/23 1430  Spiritual Encounters  Type of Visit Initial  Care provided to: Patient  Referral source Physician  Reason for visit Urgent spiritual support  OnCall Visit No  Spiritual Framework  Presenting Themes Goals in life/care;Values and beliefs;Significant life change;Impactful experiences and emotions  Values/beliefs God in control; not to judge others; kindness  Community/Connection Family  Patient Stress Factors Health changes  Family Stress Factors Not reviewed  Goals  Self/Personal Goals to live as long as possible  Clinical Care Goals continue any treatments that will prolong her life  Additional Comment(s) Patient is unsure of her prognosis this gives her fear  Interventions  Spiritual Care Interventions Made Established relationship of care and support;Compassionate presence;Reflective listening;Normalization of emotions;Narrative/life review;Explored values/beliefs/practices/strengths;Prayer  Intervention Outcomes  Outcomes Connection to spiritual care;Awareness around self/spiritual resourses;Connection to values and goals of care;Awareness of health;Awareness of support;Reduced anxiety;Reduced fear  Spiritual Care Plan  Spiritual Care Issues Still Outstanding No further spiritual care needs at this time (see row info)   Patient stated she was "going to die." She was informed that her "kidney's stopped working." Patient is anxious about her prognosis. Chaplain provided spiritual and emotional support around this topic. Chaplain encouraged the patient to ask medical providers any medical questions. Chaplain prayed with patient. Patient appeared to be less anxious and more calm at the end of the visit.   Arlyce Dice, Chaplain Resident [phone]

## 2023-03-09 NOTE — Progress Notes (Signed)
Admit: 02/26/2023 LOS: 10  [name] [name] is a 67 year old female hospitalized for T spine abscess in setting of known osteomyelitis being treated with IV daptomycin. Pt has CKD V with baseline creatinine ~4.5 and follows with Dr. Ronalee Belts with [facility] but not seen since 06/2021. D/t intrinsic AKI, likely ATN, started HD with temp HD catheter placed 5/3 and removed 5/8.  Subjective:  Patient states she is doing okay aside from right-sided.  She continues to have, was not using bedpan, did not speak for long  05/09 0701 - 05/10 0700 In: 440 [P.O.:440] Out: 400 [Urine:400]  Filed Weights   03/07/23 0500 03/08/23 0517 03/09/23 0500  Weight: 77.7 kg 80.1 kg 81 kg    Scheduled Meds:  acetaminophen  1,000 mg Oral TID   apixaban  2.5 mg Oral BID   calcium carbonate  1 tablet Oral Daily   carvedilol  6.25 mg Oral BID WC   Chlorhexidine Gluconate Cloth  6 each Topical Daily   Chlorhexidine Gluconate Cloth  6 each Topical Q0600   darbepoetin (ARANESP) injection - NON-DIALYSIS  40 mcg Subcutaneous Q Fri-1800   diclofenac Sodium  4 g Topical QID   DULoxetine  60 mg Oral Daily   feeding supplement (GLUCERNA SHAKE)  237 mL Oral BID BM   lidocaine  2 patch Transdermal Q24H   methocarbamol  750 mg Oral TID   mometasone-formoterol  2 puff Inhalation BID   nutrition supplement (JUVEN)  1 packet Oral BID BM   oxyCODONE  10 mg Oral Q12H   pregabalin  75 mg Oral Daily   sodium chloride flush  10-40 mL Intracatheter Q12H   sodium chloride flush  3 mL Intravenous Q12H   Continuous Infusions:  DAPTOmycin (CUBICIN) 650 mg in sodium chloride 0.9 % IVPB 650 mg (03/07/23 1837)   PRN Meds:.albuterol, diazepam, hydrALAZINE, naLOXone (NARCAN)  injection, ondansetron (ZOFRAN) IV, oxyCODONE  Current Labs: reviewed    Physical Exam:  Blood pressure (!) 170/70, pulse 64, temperature (!) 97.5 F (36.4 C), temperature source Oral, resp. rate 16, height 4\' 11"  (1.499 m), weight 81 kg, SpO2 100  %. General: Patient lying in bed, about to use bedpan Cardio: Well-perfused Respiratory: Breathing comfortably on room air  A AKI on CKD V with decreased urinary output: FENa of 2.6% supporting intrinsic AKI, likely ATN. Cr peaked at 8.03.  Patient had temporary HD catheter placed 03/02/2023 with HD on 5/3 and 5/4. HD catheter removed on 5/8. Cr up trending slightly w/out HD: 3.62>3.79>3.96>4.63>4.89 today - around baseline of ~4.5.  Documented 24-hour UOP of  only 400 mL. Metabolic acidosis in setting of CKD V : Sodium bicarbonate d/c'd 5/6 - bicarb WNL to 25 today. Anemia of CKD V: Hgb stable at 7.5 yesterday and around baseline (~7.5-9). Iron studies with good saturation ratio and iron WNL.  Hypokalemia.  Resolved - K of 4.2 today Diastolic CHF: patient does not appear to be fluid overloaded on exam and has history of HFpEF not HFrEF.  No indication for diuresis at this time.  Will hold home Lasix 20 mg BID.     P AKI on CKD V with decreased urinary output:              -No need for HD at this time.  Encourage p.o. intake.  Nephrology will continue to follow and consider need for tunneled cath and HD in future which pt willing to do if needed.              -  Daily RFP             -stricti I/Os Metabolic acidosis: Resolved, will CTM with AM RFP Diastolic CHF: Continue to monitor I/Os and volume status and hold home lasix Hypokalemia: CTM Anemia: start aransep 40 mcg weekly Osteomyelitis/discitis:  per primary team and ID Other medical conditions: per primary team Medication Issues; Preferred narcotic agents for pain control are hydromorphone, fentanyl, and methadone. Morphine should not be used.  Baclofen should be avoided Avoid oral sodium phosphate and magnesium citrate based laxatives / bowel preps     Erick Alley, DO   Recent Labs  Lab 03/07/23 0043 03/08/23 0033 03/09/23 0258  NA 135 136 134*  K 3.4* 4.2 4.2  CL 103 104 102  CO2 24 25 25   GLUCOSE 100* 126* 106*  BUN 24*  27* 39*  CREATININE 3.96* 4.63* 4.89*  CALCIUM 7.9* 7.9* 8.0*  PHOS 3.0 3.0 4.1   Recent Labs  Lab 03/06/23 0329 03/07/23 0043 03/08/23 0033  WBC 5.1 4.8 5.7  HGB 7.5* 7.5* 7.5*  HCT 24.7* 24.7* 24.9*  MCV 91.5 91.1 92.2  PLT 130* 130* 126*

## 2023-03-09 NOTE — Plan of Care (Signed)

## 2023-03-09 NOTE — Progress Notes (Signed)
Per dayshift RN, Foley cath removed at 18:00 pm ish, patient had not voided, no bladder distention noted, no tenderness, bladder scanned at 01:30 am show a retention of 150 ml. Encourage to void using bedside coomode, patient stated she's in too much pain, will try bedpan instead, bedpan given, voided 100 ml.

## 2023-03-09 NOTE — Consult Note (Signed)
WOC consulted for foot wound.  WOC nursing saw this patient for consultation 02/28/23 and provided guidance for wound care that included silver hydrofiber.  Per nursing flow sheets unclear that silver has been used. New consult for WOC nursing to consider silver hydrofiber use.  Notified bedside nursing to follow current orders and notified hospitalist of same.   [name] [name] [facility], CNS, [employer] [phone]

## 2023-03-09 NOTE — Plan of Care (Signed)
  Problem: Activity: Goal: Ability to return to baseline activity level will improve Outcome: Progressing   Problem: Coping: Goal: Ability to adjust to condition or change in health will improve Outcome: Progressing   Problem: Safety: Goal: Ability to remain free from injury will improve Outcome: Progressing   Problem: Pain Managment: Goal: General experience of comfort will improve Outcome: Progressing

## 2023-03-09 NOTE — Plan of Care (Signed)
  Problem: Metabolic: Goal: Ability to maintain appropriate glucose levels will improve Outcome: Progressing   Problem: Education: Goal: Knowledge of General Education information will improve Description: Including pain rating scale, medication(s)/side effects and non-pharmacologic comfort measures Outcome: Progressing   Problem: Nutrition: Goal: Adequate nutrition will be maintained Outcome: Progressing   Problem: Coping: Goal: Level of anxiety will decrease Outcome: Progressing   Problem: Pain Managment: Goal: General experience of comfort will improve Outcome: Progressing

## 2023-03-09 NOTE — Progress Notes
A follow-up comprehensive medication management visit was completed today via telephone.    Referral reason: Hypertension Management and [facility]  Referring provider: Dr. Sherlyn Hay, MD    [facility] enrollment date: 10/02/2022  [facility] study end date: 10/02/2024  Study coordinator: Shelbie Ammons    Assessment & Plan     Hypertension  Patient's blood pressure is uncontrolled per Qardio report. They have had a microalbumin completed. They have normal-mild albuminuria present. Their goal blood pressure is < 130/80 mmHg based on 2018 ACC/AHA guidelines.    Last clinic BP reading on 3/29: 127/67 mmHg    [facility]ardio Home Blood Pressure Readings:  1 month average:  142/86 mmHg, HR: 73   3 month average: 140/86 mmHg, HR: 75    Today, Rhonda Fletcher reports her home blood pressures the past couple days have been higher due to stress with neighbors.  She notes she has continued going to the gym. Encouraged patient to continue with exercise! Due to blood pressures continuing to be above goal we will re-start irbesartan.     Plan  STOP:  Irbesartan 300 mg by mouth daily  - Patient reports she took one pill last night since she received this medicine through mail order. Discussed we will stop the 300 mg daily and I will order the starting 150 mg daily dose for her. Patient expressed understanding and is agreeable to this plan.    RE-START:  Irbesartan 150 mg by mouth daily  - Re-starting at starting dose as patient has not been taking irbesartan for over a month  - Discussed with patient plan to repeat BMP 3-4 weeks after initiation for safety  - No renal dose adjustment needed   - Prescription sent to patient's mail order pharmacy per patient request     CONTINUE:  Checking blood pressure in the morning and evening every other day (around 3-4 times per week)    Amlodipine 10mg  daily in the morning     Carvedilol 25mg  po twice daily with breakfast and dinner     Ensure resting 5-10 minutes before blood pressure reading Limiting foods high in salt such as fried chicken (has about 500 mg of sodium per 3 oz serving)    Going to the gym about 3-4 days per week for about 6-8 hours per week    Future considerations:  Patient does report she has clonidine patches at home. However, she does not wish to re-start these at this time  Initiating therapy such as chlorthalidone. Discussed with Dr. Philomena Course who agrees with this plan if needed and ensuring follow-up labs. Appreciate Dr. Vanice Sarah input!      Follow-up  The patient will continue to follow up with the pharmacist. Return to pharmacist in 3 weeks via telephone. The return visit was scheduled during today's visit.    Subjective & Objective      Rhonda Fletcher was referred to the pharmacy team for hypertension management. She does report dizziness all the time. Rhonda Fletcher reports ensuring she is resting prior to taking blood pressure. Patient does note her neighbor contributes to a higher stress level. She has eliminated foods like fried chicken that tend to have a higher sodium amount from her diet.    Patient reports she did try a couple clonidine patches for a couple days and they made her feel sluggish so she has not been utilizing them for blood pressure control. She does not recall what her blood pressures were reading while utilizing the clonidine patches.  Hypertension    HPI  Current hypertension medication regimen:   Irbesartan to 300mg  daily (maximum dose) - STOP (new script for 150 mg strength sent to the pharmacy on 5/10)  Amlodipine 10mg  daily (maximum dose)  Carvedilol 25mg  po twice daily (maximum dose)  Previous medications for hypertension: Hctz (nausea/vomiting), spironolactone (nausea/vomiting), hydralazine (burned stomach), clonidine (felt sluggish)    The baseline blood pressure was 143/75 on 08/31/2022.    Lifestyle  Physical activity/exercise: yes. Tries to go to the gym. Wants to go to the gym 3-4 times per week in the new year.  Adds salt to food: no. sometimes add seasoning salt  Caffeine consumption: yes. 1-2 cups of coffee each morning  Elevated stress: yes. neighbor does cause stress  Alcohol use: yes. 1 glass of wine every once in a while  Tobacco use: no    Monitoring  Patient instructed to check blood pressure 3 time(s) per week.  Cuff type: Qardio  Appropriate technique: yes    Symptom Review  Hypotension: no  Chest pain: no  Shortness of breath: no  Lower extremity edema: no. High blood pressure symptoms include lightheadedness, headache, change in vision    Comorbidities  ASCVD condition(s): none  CKD: yes  Diabetes: yes (type 2 and uncontrolled)  Dyslipidemia: yes    Health Maintenance  Aspirin utilization: Patient is not taking aspirin due to the following reason(s): not indicated.  Statin utilization: Patient is not taking a statin.    [facility]ardio Blood Pressure Readings:          Primary Care - Labs  BP Readings from Last 3 Encounters:   01/26/23 127/67   12/26/22 (!) 154/86   08/31/22 (!) 143/75     Pulse Readings from Last 3 Encounters:   01/26/23 72   12/26/22 77   08/31/22 63     Comprehensive Metabolic Profile    Lab Results   Component Value Date/Time    NA 136 (L) 01/26/2023 09:30 AM    K 4.6 01/26/2023 09:30 AM    CL 104 01/26/2023 09:30 AM    CO2 19 (L) 01/26/2023 09:30 AM    GAP 13 (H) 01/26/2023 09:30 AM    BUN 25 01/26/2023 09:30 AM    CR 1.90 (H) 01/26/2023 09:30 AM    GLU 312 (H) 01/26/2023 09:30 AM    GLU 146 (H) 02/21/2018 09:45 AM    Lab Results   Component Value Date/Time    CA 9.5 01/26/2023 09:30 AM    PO4 3.3 01/24/2023 08:44 AM    ALBUMIN 4.1 07/25/2022 12:07 PM    TOTPROT 7.4 07/25/2022 12:07 PM    ALKPHOS 82 07/25/2022 12:07 PM    AST 13 07/25/2022 12:07 PM    ALT 12 07/25/2022 12:07 PM    TOTBILI 0.5 07/25/2022 12:07 PM    GFR 33 (L) 08/24/2020 09:30 AM    GFRAA 40 (L) 08/24/2020 09:30 AM        eGFR   Date Value Ref Range Status   01/26/2023 29 (L) >60 mL/min Final     Comment:     eGFR calculated using the CKD-EPIcr_R equation Lab Results   Component Value Date/Time    URMALBCRRAT 30.53 (H) 01/24/2023 08:49 AM     BMI Readings from Last 1 Encounters:   01/26/23 27.99 kg/m?     The 10-year ASCVD risk score (Arnett DK, et al., 2019) is: 24%    Values used to calculate the score:      Age:  67 years      Sex: Female      Is Non-Hispanic African American: Yes      Diabetic: Yes      Tobacco smoker: No      Systolic Blood Pressure: 127 mmHg      Is BP treated: Yes      HDL Cholesterol: 97 MG/DL      Total Cholesterol: 232 MG/DL     Medication History  Medication history was not completed because previously completed (follow-up visit).    Adverse Drug Reactions    Side effect(s) were not reported.    Refills needed: no    Home Medications    Medication Sig   albuterol sulfate (PROAIR HFA) 90 mcg/actuation HFA aerosol inhaler Inhale two puffs by mouth into the lungs every 6 hours as needed.   alcohol swabs (DROPSAFE ALCOHOL PREP PADS) USE TO CLEAN SKIN BEFORE FINGERSTICK   amLODIPine (NORVASC) 10 mg tablet TAKE 1 TABLET EVERY DAY   azelastine (ASTELIN) 137 mcg (0.1 %) nasal spray Apply two sprays to each nostril as directed twice daily.   betamethasone valerate (VALISONE) 0.1 % topical cream APPLY TOPICALLY TO AFFECTED AREA DAILY   Blood Glucose Control, Normal soln Use in glucose meter, Accu-check Aviva   blood sugar diagnostic (ACCU-CHEK AVIVA PLUS TEST STRP) test strip Use one strip as directed daily before breakfast. 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   carvediloL (COREG) 25 mg tablet TAKE ONE TABLET BY MOUTH TWICE DAILY WITH MEALS. TAKE WITH FOOD.   cetirizine (ZYRTEC) 10 mg tablet Take one tablet by mouth daily.     cholecalciferol(+) (VITAMIN D-3) 2,000 unit tablet Take 1 Tab by mouth daily.  Patient taking differently: Take one tablet by mouth daily. 5,000 units daily   cyanocobalamin (vitamin B-12) 5,000 mcg cap Take 1 tablet by mouth daily.   cyclobenzaprine (FLEXERIL) 5 mg tablet TAKE 1 TABLET AT BEDTIME   ezetimibe (ZETIA) 10 mg tablet Take one tablet by mouth daily.   famotidine (PEPCID) 20 mg tablet TAKE 1 TABLET TWICE DAILY   ferrous sulfate (FEOSOL) 325 mg (65 mg iron) tablet Take one tablet by mouth every 48 hours.   fluconazole (DIFLUCAN) 150 mg tablet Take one tablet by mouth daily.   fluticasone propionate (FLONASE) 50 mcg/actuation nasal spray, suspension USE 2 SPRAYS IN EACH NOSTRIL AS DIRECTED DAILY. SHAKE BOTTLE GENTLY BEFORE USING   glimepiride (AMARYL) 4 mg tablet Take one tablet by mouth every morning.   irbesartan (AVAPRO) 150 mg tablet Take one tablet by mouth at bedtime daily.   lancets MISC Use one each as directed daily before breakfast. Diag: E11.29   mirtazapine (REMERON) 15 mg tablet TAKE ONE TABLET BY MOUTH AT BEDTIME DAILY.   pantoprazole DR (PROTONIX) 40 mg tablet Take one tablet by mouth daily.   pioglitazone (ACTOS) 45 mg tablet Take one tablet by mouth daily.   sodium bicarbonate 650 mg tablet Take one tablet by mouth twice daily.      Education  Education provided: yes    Hypertension  Education components: BP goal and disease risk factors and encouraged patient to monitor BP at home    Time spent with patient: 20 minutes    Thank you for allowing me to participate in the care of your patient,  Lynelle Smoke, [facility]  [facility] Clinical Pharmacist

## 2023-03-09 NOTE — Progress Notes (Addendum)
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 Back surgery 2021 with screws placed complicated by discitis at T10-11, T4-T5 as well as corpectomy L1 previously staph epi as well as Pseudomonas completing daptomycin 08/09/2022  Recent hospitalization 3/31 through 4/7 with osteomyelitis discitis thoracic spine-neurosurgery at the time did not feel she would be a candidate and she grew MRSE and was started on daptomycin Placed and was due to complete Rx 5/31  Represented 4/29 with worsening back pain nausea vomiting Bicarb 16--BUN/creatinine 53/4.6--Hemoglobin 9 platelet 142--iNR 1.3 Lactic acid normal Ammonia normal Blood cultures pending CT ABD chest pelvis = T4-T5 osteomyelitis discitis with continued paraspinal soft tissue density MRI = new osteomyelitis T3 with vertebral collapse T4 since prior MRI 12/2022 5/2 discussion with Dr. Maisie Fus neurosurgery recommends medical management increase in size of suspected dorsal epidural fluid collection 7 mm 5/3 underwent disc aspiration--end of treatment 04/13/2023 follow-up Dr. Judie Petit. 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, T5 +7 mm abscess/phlegmon negative blood culture 4/29 Biopsy results   [-] no growth to date from aspiration Per ID-complete IV daptomycin 04/13/2023, OP F/u 04/12/2023 IV Dilaudid DC 5/8 Pain control  Oxy IR 5-10 every 6 as needed  moderate pain,  continue OxyContin 10 every 12- continue Lyrica cautiously 75 twice daily Robaxin to 750 3 times daily given spasm and pain--pain is improved, no fever no chills  ATN/AKI on CKD 4 nongap acidosis followed previously by nephrology until 06/2021 Initial creatinine 8 on admission-Multifactorial insults + urinary retention/hypotension + BOOP Temp HD cath placed --underwent HD on 5/3, 5/4  temp cath removed 5/8 2/2 resolution of oliguria Bicarb as per renal Might need Dialysis regardless given rising creatinine--nephrology assessing daily Foley catheter replaced in situ 5/10 and will need to retry on 5/15  Diarrhea 1 episode on 5/8 and another on 5/9 On antibiotics-prior C. difficile-no further reports of diarr  Hypokalemia Daily labs monitor  LLE wound Examined on 5/9 Continue dressings with Aquacel-appreciate WOC nurse input will reassess wounds this weekend at some point  Chronic thrombocytopenia with platelets of 130 Last CT abdomen pelvis performed 05/2022 shows cirrhotic liver borderline splenomegaly Mild elevation alk phos transaminases are normal-no further workup at this stage  Normocytic anemia Has not met transfusion threshold, iron studies 5/3 did not show any deficiency and saturations are good-only monitor some component of iatrogenic blood draws causing this Iron Aspirin 40 Mcg Weekly  DM TY 2 with hypoglycemia A1c 4.6 Discontinued all coverage-blood sugars have persistently been improved since 5/6 patient is eating 50 -- 100% of meals 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  HFpEF last echo poor study with poor acoustic windows EF 65-70% HTN Lasix 20 twice daily held, continuing Coreg 6.25 twice daily--titrate as able  Chronic lower extremity DVTs (unclear when) currently on apixaban 2.5 twice daily  Imaging in 2017 does not suggest any DVT-I will ask patient questions on next eval but may consider discontinuation in the outpatient setting --watch  platelets carefully  Depression/anxiety continue Cymbalta 60 daily, diazepam 5 every 12 as needed anxiety  DVT prophylaxis: Apixaban Code Status: Full Family Communication: called but didn't  getspouse [name] [name] [phone] Disposition:  Status is: Inpatient Remains inpatient appropriate because:   Await resolution and steady state of kidney function prior to discontinuation   Subjective:  Anxious re: discussion with nephrology Chaplain consult requested Needed Foley placed  Objective: Vitals:   03/09/23 0320 03/09/23 0500 03/09/23 0713 03/09/23 0743  BP: (!) 160/71  (!) 170/70   Pulse: 70  64   Resp: 17  16 16   Temp: 98.2 F (36.8 C)  (!) 97.5 F (36.4 C)   TempSrc: Oral  Oral   SpO2: 99%  100% 98%  Weight:  81 kg    Height:        Intake/Output Summary (Last 24 hours) at 03/09/2023 1604 Last data filed at 03/09/2023 0325 Gross per 24 hour  Intake --  Output 400 ml  Net -400 ml    Filed Weights   03/07/23 0500 03/08/23 0517 03/09/23 0500  Weight: 77.7 kg 80.1 kg 81 kg    Examination:  Tearful and upset about possible need for dialysis Otherwise looks about the same Chest clear Abdomen soft no rebound Back not examined mobilizing well Lower extremity wound not examined today   Data Reviewed: personally reviewed   CBC    Component Value Date/Time   WBC 5.7 03/08/2023 0033   RBC 2.70 (L) 03/08/2023 0033   HGB 7.5 (L) 03/08/2023 0033   HGB 8.3 (L) 01/12/2023 1419   HGB 15.0 11/30/2008 0927   HCT 24.9 (L) 03/08/2023 0033   HCT 43.8 11/30/2008 0927   PLT 126 (L) 03/08/2023 0033   PLT 120 (L) 01/12/2023 1419   PLT 133 (L) 11/30/2008 0927   MCV 92.2 03/08/2023 0033   MCV 83.9 11/30/2008 0927   MCH 27.8 03/08/2023 0033   MCHC 30.1 03/08/2023 0033   RDW 16.4 (H) 03/08/2023 0033   RDW 13.0 11/30/2008 0927   LYMPHSABS 0.6 (L) 02/26/2023 0825   LYMPHSABS 1.2 11/30/2008 0927   MONOABS 0.2 02/26/2023 0825   MONOABS 0.3 11/30/2008 0927   EOSABS  0.1 02/26/2023 0825   EOSABS 0.1 11/30/2008 0927   BASOSABS 0.0 02/26/2023 0825   BASOSABS 0.0 11/30/2008 0927      Latest Ref Rng & Units 03/09/2023    2:58 AM 03/08/2023    8:56 AM 03/08/2023   12:33 AM  CMP  Glucose 70 - 99 mg/dL 098   119   BUN 8 - 23 mg/dL 39   27   Creatinine 1.47 - 1.00 mg/dL 8.29   5.62   Sodium 130 - 145 mmol/L 134   136   Potassium 3.5 - 5.1 mmol/L 4.2   4.2   Chloride 98 - 111 mmol/L 102   104   CO2 22 - 32 mmol/L 25   25   Calcium 8.9 - 10.3 mg/dL 8.0   7.9   Total Protein 6.5 - 8.1 g/dL  7.0    Total Bilirubin 0.3 - 1.2 mg/dL  0.9    Alkaline Phos 38 - 126 U/L  170    AST 15 - 41 U/L  16    ALT 0 - 44 U/L  9       Radiology Studies: No results found.   Scheduled Meds:  acetaminophen  1,000 mg Oral TID   apixaban  2.5 mg Oral BID   calcium carbonate  1 tablet Oral  Daily   carvedilol  6.25 mg Oral BID WC   Chlorhexidine Gluconate Cloth  6 each Topical Q0600   darbepoetin (ARANESP) injection - NON-DIALYSIS  40 mcg Subcutaneous Q Fri-1800   diclofenac Sodium  4 g Topical QID   DULoxetine  60 mg Oral Daily   feeding supplement (GLUCERNA SHAKE)  237 mL Oral BID BM   lidocaine  2 patch Transdermal Q24H   methocarbamol  750 mg Oral TID   mometasone-formoterol  2 puff Inhalation BID   nutrition supplement (JUVEN)  1 packet Oral BID BM   oxyCODONE  10 mg Oral Q12H   pregabalin  75 mg Oral Daily   sodium chloride flush  10-40 mL Intracatheter Q12H   sodium chloride flush  3 mL Intravenous Q12H   Continuous Infusions:  DAPTOmycin (CUBICIN) 650 mg in sodium chloride 0.9 % IVPB 650 mg (03/07/23 1837)     LOS: 10 days   Time spent: 67  Rhetta Mura, MD [facility] To contact the attending provider between 7A-7P or the covering provider during after hours 7P-7A, please log into the web site [email or web address] and access using universal [place] password for that web site. If you do not have the password, please call the hospital  operator.  03/09/2023, 4:04 PM

## 2023-03-09 NOTE — Progress Notes (Signed)
Physical Therapy Treatment Patient Details Name: [name] [name] MRN: [id number] DOB: [birth year] Today's Date: 03/09/2023   History of Present Illness Pt is 67 year old who is moslty wheelchair bound and has CKD stage V, hypertension, cirrhosis of the liver, OSA, morbid obesity, L1 osteomyelitis status post L1 corpectomy, T10-T11 discitis osteomyelitis status post posterior arthrodesis of T8-T11 and removal of T11 pedicle screws and rod in 12/23.  Last month, diagnosed with T4-T5 discitis osteomyelitis which was being treated with daptomycin.  Neurosurgery did not recommend any further interventions.     The patient returns to the hospital for worsening back pain for [redacted] week along with an episode of vomiting.  Pt found to have AKI requiring 2 cycles of HD.  Also, with ongoing osteomyelitis/discitis Thoracic spine with T4-5 disc aspiration by IR on 5/3.  Neurosurgery recommends further medical management, no surgical intervention at this time.    PT Comments    Pt received in supine and agreeable to bed level exercises with encouragement. Pt declining sitting EOB and OOB mobility due to back and R side pain. Pt upset during session due to information about her declining kidney function. Pt demonstrating good BLE strength and control with exercises. Pt requiring cues for technique and to maintain back precautions. Pt frequently reaching for R rail and twisting to decrease R side pain despite cues for precautions and being given a pillow for bracing. Pt reporting that she tries to exercise her legs for 45 mins twice a day. Pt continues to benefit from PT services to progress toward functional mobility goals.    Recommendations for follow up therapy are one component of a multi-disciplinary discharge planning process, led by the attending physician.  Recommendations may be updated based on patient status, additional functional criteria and insurance authorization.     Assistance Recommended at Discharge  Intermittent Supervision/Assistance  Patient can return home with the following A little help with walking and/or transfers;A little help with bathing/dressing/bathroom;Assistance with cooking/housework;Help with stairs or ramp for entrance   Equipment Recommendations  None recommended by PT    Recommendations for Other Services       Precautions / Restrictions Precautions Precautions: Back Precaution Booklet Issued: No Required Braces or Orthoses: Spinal Brace Spinal Brace: Thoracolumbosacral orthotic Spinal Brace Comments: No orders but pt had from prior admission and wears when OOB Restrictions Weight Bearing Restrictions: No     Mobility  Bed Mobility Overal bed mobility: Needs Assistance Bed Mobility: Rolling Rolling: Supervision         General bed mobility comments: Pt requiring cues for back precautions, however continuing to reach for R bedrail and twist to relieve side pain. Pt declining to sit EOB due to pain.        Balance       Sitting balance - Comments: unable to assess                                    Cognition Arousal/Alertness: Awake/alert Behavior During Therapy: WFL for tasks assessed/performed Overall Cognitive Status: Within Functional Limits for tasks assessed                                 General Comments: Pt upset at beginning of session, but able to participate in exercises        Exercises Total Joint Exercises Towel Squeeze: AROM, Supine, Both, 5  reps General Exercises - Lower Extremity Quad Sets: AROM, Supine, Both, 5 reps Heel Slides: AROM, Supine, Both, 10 reps Hip ABduction/ADduction: AROM, Supine, Both, 5 reps Straight Leg Raises: AROM, Supine, Both, 10 reps    General Comments        Pertinent Vitals/Pain Pain Assessment Pain Assessment: Faces Faces Pain Scale: Hurts whole lot Pain Location: back and R side Pain Descriptors / Indicators: Grimacing, Guarding Pain Intervention(s):  Monitored during session, Repositioned     PT Goals (current goals can now be found in the care plan section) Acute Rehab PT Goals Patient Stated Goal: return home PT Goal Formulation: With patient/family Time For Goal Achievement: 03/20/23 Potential to Achieve Goals: Good Progress towards PT goals: Not progressing toward goals - comment (Pt declining bed mobility and transfers due to pain)    Frequency    Min 3X/week      PT Plan Current plan remains appropriate       AM-PAC PT "6 Clicks" Mobility   Outcome Measure  Help needed turning from your back to your side while in a flat bed without using bedrails?: A Little Help needed moving from lying on your back to sitting on the side of a flat bed without using bedrails?: A Little Help needed moving to and from a bed to a chair (including a wheelchair)?: A Little Help needed standing up from a chair using your arms (e.g., wheelchair or bedside chair)?: A Little Help needed to walk in hospital room?: Total Help needed climbing 3-5 steps with a railing? : Total 6 Click Score: 14    End of Session   Activity Tolerance: Patient limited by pain Patient left: in bed;with call bell/phone within reach;with family/visitor present;with bed alarm set Nurse Communication: Mobility status PT Visit Diagnosis: Other abnormalities of gait and mobility (R26.89);Muscle weakness (generalized) (M62.81);Pain     Time: 1610-9604 PT Time Calculation (min) (ACUTE ONLY): 19 min  Charges:  $Therapeutic Exercise: 8-22 mins                     Johny Shock, PTA Acute Rehabilitation Services Secure Chat Preferred  [phone]    Johny Shock 03/09/2023, 4:01 PM

## 2023-03-10 ENCOUNTER — Ambulatory Visit: Admit: 2023-03-09 | Discharge: 2023-03-10 | Payer: MEDICARE

## 2023-03-10 DIAGNOSIS — M4624 Osteomyelitis of vertebra, thoracic region: Secondary | ICD-10-CM | POA: Diagnosis not present

## 2023-03-10 DIAGNOSIS — I10 Essential (primary) hypertension: Secondary | ICD-10-CM

## 2023-03-10 LAB — RENAL FUNCTION PANEL
Albumin: 2.7 g/dL — ABNORMAL LOW (ref 3.5–5.0)
Anion gap: 9 (ref 5–15)
BUN: 44 mg/dL — ABNORMAL HIGH (ref 8–23)
CO2: 23 mmol/L (ref 22–32)
Calcium: 7.9 mg/dL — ABNORMAL LOW (ref 8.9–10.3)
Chloride: 102 mmol/L (ref 98–111)
Creatinine, Ser: 5.03 mg/dL — ABNORMAL HIGH (ref 0.44–1.00)
GFR, Estimated: 9 mL/min — ABNORMAL LOW (ref >60)
Glucose, Bld: 86 mg/dL (ref 70–99)
Phosphorus: 4.5 mg/dL (ref 2.5–4.6)
Potassium: 4.6 mmol/L (ref 3.5–5.1)
Sodium: 134 mmol/L — ABNORMAL LOW (ref 135–145)

## 2023-03-10 LAB — GLUCOSE, CAPILLARY
Glucose-Capillary: 107 mg/dL — ABNORMAL HIGH (ref 70–99)
Glucose-Capillary: 90 mg/dL (ref 70–99)
Glucose-Capillary: 127 mg/dL — ABNORMAL HIGH (ref 70–99)
Glucose-Capillary: 80 mg/dL (ref 70–99)

## 2023-03-10 MED ORDER — DARBEPOETIN ALFA 100 MCG/0.5ML IJ SOSY
100.0000 ug | PREFILLED_SYRINGE | INTRAMUSCULAR | Status: DC
  Administered 2023-03-16 – 2023-03-30 (×3): 100 ug via SUBCUTANEOUS
  Filled 2023-03-10 (×3): qty 0.5

## 2023-03-10 MED ORDER — HYDROMORPHONE HCL 2 MG PO TABS
2.0000 mg | ORAL_TABLET | ORAL | Status: DC | PRN
  Administered 2023-03-10 – 2023-03-11 (×2): 2 mg via ORAL
  Filled 2023-03-10 (×2): qty 1

## 2023-03-10 NOTE — Progress Notes (Addendum)
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 Back surgery 2021 with screws placed complicated by discitis at T10-11, T4-T5 as well as corpectomy L1 previously staph epi as well as Pseudomonas completing daptomycin 08/09/2022  Recent hospitalization 3/31 through 4/7 with osteomyelitis discitis thoracic spine-neurosurgery at the time did not feel she would be a candidate and she grew MRSE and was started on daptomycin Placed and was due to complete Rx 5/31  Represented 4/29 with worsening back pain nausea vomiting Bicarb 16--BUN/creatinine 53/4.6--Hemoglobin 9 platelet 142--iNR 1.3 Lactic acid normal Ammonia normal Blood cultures pending CT ABD chest pelvis = T4-T5 osteomyelitis discitis with continued paraspinal soft tissue density MRI = new osteomyelitis T3 with vertebral collapse T4 since prior MRI 12/2022 5/2 discussion with Dr. Maisie Fus neurosurgery recommends medical management increase in size of suspected dorsal epidural fluid collection 7 mm 5/3 underwent disc aspiration--end of treatment 04/13/2023 follow-up Dr. Judie Petit. 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, T5 +7 mm abscess/phlegmon negative blood culture 4/29 Biopsy results   [-] no growth to date from aspiration Per ID-complete IV daptomycin 04/13/2023, OP F/u 04/12/2023 IV Dilaudid DC  5/8 Pain control  Opiates-->Dilaudid 5/11 continue Lyrica cautiously 75 twice daily Robaxin to 750 3 times daily given spasm and pain- no fever no chills  Diarrhea Patiet states 3 watery stool--nurse to verify---seems to have some solid componenet Stop ensure as this can casue stools to be "soft"--not on laxatives currently Note has had cdiff in the past--watch for fever, abd pain  ATN/AKI on CKD 4 nongap acidosis followed previously by nephrology until 06/2021 Initial creatinine 8 on admission-Multifactorial insults + urinary retention/hypotension + BOOP Temp HD cath placed --underwent HD on 5/3, 5/4  temp cath removed 5/8 2/2 Bicarb as per renal Cre rising, --Going for mapping and planning for HD as per renal  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 and will need to retry on 5/15 voiding trial  Hypokalemia Resolved--watch K  LLE wound Examined on 5/9 Continue dressings with Aquacel-appreciate WOC nurse input will  reassess wounds later this week  Chronic thrombocytopenia with platelets of 130 Last CT abdomen pelvis performed 05/2022 shows cirrhotic liver borderline splenomegaly Mild elevation alk phos transaminases are normal-no further workup at this stage--OP elastography etc etc  Normocytic anemia iron studies 5/3 did not show any deficiency and saturations are good-only monitor some component of iatrogenic blood draws causing this Aranesp 40 Mcg Weekly  DM TY 2 with hypoglycemia A1c 4.6 Discontinued all coverage-blood sugars hi 90's soince 5/6 patient is eating 50 -- 100% of meals 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  HFpEF last echo poor study with poor acoustic windows EF 65-70% HTN Lasix 20 twice daily held, continuing Coreg 6.25 twice daily--titrate as able and adjust downwards if going to iHD  Chronic lower extremity DVTs (unclear when) currently on apixaban 2.5 twice daily  Imaging in 2017 does not suggest any DVT-had bilateral DVT  per her ~ 5 years prior ~  2019 with back surgeries --watch platelets   Depression/anxiety continue Cymbalta 60 daily, diazepam 5 every 12 as needed anxiety  DVT prophylaxis: Apixaban Code Status: Full Family Communication: no one present today Disposition:  Status is: Inpatient Remains inpatient appropriate because:   Will need CLIP and HD   Subjective:  Fair no distress ~ 3 stool today--soft per RN No cp Back pain continues to plague her.  Have explained it will be challenging to long term control--see above  Objective: Vitals:   03/09/23 2113 03/10/23 0500 03/10/23 0741 03/10/23 0837  BP: (!) 146/100   (!) 124/51  Pulse: 69  72 65  Resp: 16  18 18   Temp: 98.1 F (36.7 C)   97.8 F (36.6 C)  TempSrc: Oral   Oral  SpO2: 92%  98% 100%  Weight:  80.3 kg    Height:        Intake/Output Summary (Last 24 hours) at 03/10/2023 1625 Last data filed at 03/09/2023 1655 Gross per 24 hour  Intake 120 ml  Output 550 ml  Net -430 ml    Filed Weights   03/08/23 0517 03/09/23 0500 03/10/23 0500  Weight: 80.1 kg 81 kg 80.3 kg    Examination:  fair Otherwise looks about the same Chest clear, neck dressing L side removed Abdomen soft no rebound--foley in place Back not examined mobilizing well Lower extremity wound not examined today   Data Reviewed: personally reviewed   CBC    Component Value Date/Time   WBC 5.7 03/08/2023 0033   RBC 2.70 (L) 03/08/2023 0033   HGB 7.5 (L) 03/08/2023 0033   HGB 8.3 (L) 01/12/2023 1419   HGB 15.0 11/30/2008 0927   HCT 24.9 (L) 03/08/2023 0033   HCT 43.8 11/30/2008 0927   PLT 126 (L) 03/08/2023 0033   PLT 120 (L) 01/12/2023 1419   PLT 133 (L) 11/30/2008 0927   MCV 92.2 03/08/2023 0033   MCV 83.9 11/30/2008 0927   MCH 27.8 03/08/2023 0033   MCHC 30.1 03/08/2023 0033   RDW 16.4 (H) 03/08/2023 0033   RDW 13.0 11/30/2008 0927   LYMPHSABS 0.6 (L) 02/26/2023 0825   LYMPHSABS 1.2 11/30/2008 0927   MONOABS 0.2 02/26/2023 0825   MONOABS 0.3 11/30/2008 0927    EOSABS 0.1 02/26/2023 0825   EOSABS 0.1 11/30/2008 0927   BASOSABS 0.0 02/26/2023 0825   BASOSABS 0.0 11/30/2008 0927      Latest Ref Rng & Units 03/10/2023   12:29 AM 03/09/2023    2:58 AM 03/08/2023    8:56 AM  CMP  Glucose 70 - 99 mg/dL 86  161    BUN 8 - 23 mg/dL 44  39    Creatinine 0.96 - 1.00 mg/dL 0.45  4.09    Sodium 811 - 145 mmol/L 134  134    Potassium 3.5 - 5.1 mmol/L 4.6  4.2    Chloride 98 - 111 mmol/L 102  102    CO2 22 - 32 mmol/L 23  25    Calcium 8.9 - 10.3 mg/dL 7.9  8.0    Total Protein 6.5 - 8.1 g/dL   7.0   Total Bilirubin 0.3 - 1.2 mg/dL   0.9   Alkaline Phos 38 - 126 U/L   170   AST 15 - 41 U/L   16   ALT 0 - 44 U/L   9      Radiology Studies: No results found.   Scheduled  Meds:  acetaminophen  1,000 mg Oral TID   apixaban  2.5 mg Oral BID   calcium carbonate  1 tablet Oral Daily   carvedilol  6.25 mg Oral BID WC   Chlorhexidine Gluconate Cloth  6 each Topical Q0600   [START ON 03/16/2023] darbepoetin (ARANESP) injection - NON-DIALYSIS  100 mcg Subcutaneous Q Fri-1800   diclofenac Sodium  4 g Topical QID   DULoxetine  60 mg Oral Daily   lidocaine  2 patch Transdermal Q24H   methocarbamol  750 mg Oral TID   mometasone-formoterol  2 puff Inhalation BID   nutrition supplement (JUVEN)  1 packet Oral BID BM   pregabalin  75 mg Oral Daily   sodium chloride flush  10-40 mL Intracatheter Q12H   sodium chloride flush  3 mL Intravenous Q12H   Continuous Infusions:  DAPTOmycin (CUBICIN) 650 mg in sodium chloride 0.9 % IVPB 650 mg (03/09/23 1644)     LOS: 11 days   Time spent: 83  Rhetta Mura, MD [facility] To contact the attending provider between 7A-7P or the covering provider during after hours 7P-7A, please log into the web site [email or web address] and access using universal [place] password for that web site. If you do not have the password, please call the hospital operator.  03/10/2023, 4:25 PM

## 2023-03-10 NOTE — Progress Notes (Signed)
Nephrology Follow-Up Consult note   Assessment/Recommendations: [name] [name] is a/an 67 y.o. adult with a past medical history significant for CKD IV/V, DM2, CHF, DVTs, admitted for osteomyelitis complicated by AKI.       AKI on CKD 5 now ESRD: Baseline creatinine was around 4.5 prior to admission.  AKI likely ATN.  Did require dialysis earlier in the hospitalization.  Had some improvement in kidney function as well as urine output and dialysis catheter was removed.  Now with persistent low urine output and rising creatinine most likely ESRD at this point -No acute need for dialysis today but will plan to start on dialysis again on Monday -Consult vascular surgery for access creation as well as tunneled dialysis catheter when able -Plan for CLIP next week -vein mapping ordered -Continue to monitor daily Cr, Dose meds for GFR -Monitor Daily I/Os, Daily weight  -Maintain MAP>65 for optimal renal perfusion.  -Avoid nephrotoxic medications including NSAIDs -Use synthetic opioids (Fentanyl/Dilaudid) if needed  Anemia: Likely multifactorial with inflammation and CKD contributing.  Increase ESA to 100 mcg weekly.  Iron replete.  HFpEF: Volume status overall acceptable.  Continue to monitor  Osteomyelitis: On daptomycin.  Per primary team  DM2: Management per primary  CKD BMD: Will obtain PTH     Recommendations conveyed to primary service.    Darnell Level [facility] 03/10/2023 11:10 AM  ___________________________________________________________  CC: osteo  Interval History/Subjective: Patient continues to have pain in her side. No other complaints. UOP remains poor and Crt increasing   Medications:  Current Facility-Administered Medications  Medication Dose Route Frequency Provider Last Rate Last Admin   acetaminophen (TYLENOL) tablet 1,000 mg  1,000 mg Oral TID Calvert Cantor, MD   1,000 mg at 03/10/23 0912   albuterol (PROVENTIL) (2.5 MG/3ML)  0.083% nebulizer solution 2.5 mg  2.5 mg Inhalation Q6H PRN Synetta Fail, MD       apixaban Everlene Balls) tablet 2.5 mg  2.5 mg Oral BID Tamera Reason, RPH   2.5 mg at 03/10/23 7829   calcium carbonate (TUMS - dosed in mg elemental calcium) chewable tablet 200 mg of elemental calcium  1 tablet Oral Daily Synetta Fail, MD   200 mg of elemental calcium at 03/10/23 0912   carvedilol (COREG) tablet 6.25 mg  6.25 mg Oral BID WC Calvert Cantor, MD   6.25 mg at 03/10/23 0912   Chlorhexidine Gluconate Cloth 2 % PADS 6 each  6 each Topical Q0600 Terrial Rhodes, MD   6 each at 03/10/23 0600   DAPTOmycin (CUBICIN) 650 mg in sodium chloride 0.9 % IVPB  8 mg/kg Intravenous Q48H Cherylin Mylar, RPH 126 mL/hr at 03/09/23 1644 650 mg at 03/09/23 1644   Darbepoetin Alfa (ARANESP) injection 40 mcg  40 mcg Subcutaneous Q Fri-1800 Terrial Rhodes, MD   40 mcg at 03/09/23 1639   diazepam (VALIUM) tablet 5 mg  5 mg Oral Q12H PRN Synetta Fail, MD   5 mg at 03/08/23 2339   diclofenac Sodium (VOLTAREN) 1 % topical gel 4 g  4 g Topical QID Calvert Cantor, MD   4 g at 03/09/23 2209   DULoxetine (CYMBALTA) DR capsule 60 mg  60 mg Oral Daily Synetta Fail, MD   60 mg at 03/10/23 0912   feeding supplement (GLUCERNA SHAKE) (GLUCERNA SHAKE) liquid 237 mL  237 mL Oral BID BM Rhetta Mura, MD   237 mL at 03/09/23 1429   hydrALAZINE (APRESOLINE) injection 10 mg  10  mg Intravenous Q6H PRN Hongalgi, Maximino Greenland, MD       lidocaine (LIDODERM) 5 % 2 patch  2 patch Transdermal Q24H Synetta Fail, MD   2 patch at 03/10/23 0912   methocarbamol (ROBAXIN) tablet 750 mg  750 mg Oral TID Rhetta Mura, MD   750 mg at 03/10/23 0912   mometasone-formoterol (DULERA) 200-5 MCG/ACT inhaler 2 puff  2 puff Inhalation BID Synetta Fail, MD   2 puff at 03/10/23 0741   naloxone (NARCAN) injection 0.4 mg  0.4 mg Intravenous PRN Hongalgi, Maximino Greenland, MD       nutrition supplement (JUVEN) (JUVEN) powder packet 1  packet  1 packet Oral BID BM Rhetta Mura, MD   1 packet at 03/10/23 0913   ondansetron (ZOFRAN) injection 4 mg  4 mg Intravenous Q6H PRN Calvert Cantor, MD   4 mg at 03/07/23 1029   oxyCODONE (Oxy IR/ROXICODONE) immediate release tablet 5-10 mg  5-10 mg Oral Q6H PRN Elease Etienne, MD   10 mg at 03/09/23 1757   oxyCODONE (OXYCONTIN) 12 hr tablet 10 mg  10 mg Oral Q12H Hongalgi, Maximino Greenland, MD   10 mg at 03/10/23 0912   pregabalin (LYRICA) capsule 75 mg  75 mg Oral Daily Hollice Espy, MD   75 mg at 03/10/23 0912   sodium chloride flush (NS) 0.9 % injection 10-40 mL  10-40 mL Intracatheter Q12H Hongalgi, Anand D, MD   10 mL at 03/10/23 0914   sodium chloride flush (NS) 0.9 % injection 3 mL  3 mL Intravenous Q12H Synetta Fail, MD   3 mL at 03/10/23 0913      Review of Systems: 10 systems reviewed and negative except per interval history/subjective  Physical Exam: Vitals:   03/10/23 0741 03/10/23 0837  BP:  (!) 124/51  Pulse: 72 65  Resp: 18 18  Temp:  97.8 F (36.6 C)  SpO2: 98% 100%   No intake/output data recorded.  Intake/Output Summary (Last 24 hours) at 03/10/2023 1110 Last data filed at 03/09/2023 1655 Gross per 24 hour  Intake 120 ml  Output 550 ml  Net -430 ml   Constitutional: well-appearing, no acute distress ENMT: ears and nose without scars or lesions, MMM CV: normal rate, trace edema in ble Respiratory: bilateral chest rise, normal work of breathing Gastrointestinal: soft, non-tender, no palpable masses or hernias Skin: no visible lesions or rashes Psych: alert, judgement/insight appropriate, appropriate mood and affect   Test Results I personally reviewed new and old clinical labs and radiology tests Lab Results  Component Value Date   NA 134 (L) 03/10/2023   K 4.6 03/10/2023   CL 102 03/10/2023   CO2 23 03/10/2023   BUN 44 (H) 03/10/2023   CREATININE 5.03 (H) 03/10/2023   GFR 9.63 (LL) 12/13/2022   CALCIUM 7.9 (L) 03/10/2023    ALBUMIN 2.7 (L) 03/10/2023   PHOS 4.5 03/10/2023    CBC Recent Labs  Lab 03/06/23 0329 03/07/23 0043 03/08/23 0033  WBC 5.1 4.8 5.7  HGB 7.5* 7.5* 7.5*  HCT 24.7* 24.7* 24.9*  MCV 91.5 91.1 92.2  PLT 130* 130* 126*

## 2023-03-11 ENCOUNTER — Inpatient Hospital Stay (HOSPITAL_COMMUNITY): Payer: PRIVATE HEALTH INSURANCE

## 2023-03-11 DIAGNOSIS — N186 End stage renal disease: Secondary | ICD-10-CM | POA: Diagnosis not present

## 2023-03-11 DIAGNOSIS — N185 Chronic kidney disease, stage 5: Secondary | ICD-10-CM | POA: Diagnosis not present

## 2023-03-11 DIAGNOSIS — M4624 Osteomyelitis of vertebra, thoracic region: Secondary | ICD-10-CM | POA: Diagnosis not present

## 2023-03-11 LAB — GLUCOSE, CAPILLARY
Glucose-Capillary: 115 mg/dL — ABNORMAL HIGH (ref 70–99)
Glucose-Capillary: 139 mg/dL — ABNORMAL HIGH (ref 70–99)
Glucose-Capillary: 66 mg/dL — ABNORMAL LOW (ref 70–99)
Glucose-Capillary: 115 mg/dL — ABNORMAL HIGH (ref 70–99)

## 2023-03-11 LAB — RENAL FUNCTION PANEL
Albumin: 2.3 g/dL — ABNORMAL LOW (ref 3.5–5.0)
Anion gap: 9 (ref 5–15)
BUN: 49 mg/dL — ABNORMAL HIGH (ref 8–23)
CO2: 22 mmol/L (ref 22–32)
Calcium: 7.6 mg/dL — ABNORMAL LOW (ref 8.9–10.3)
Chloride: 101 mmol/L (ref 98–111)
Creatinine, Ser: 4.96 mg/dL — ABNORMAL HIGH (ref 0.44–1.00)
GFR, Estimated: 9 mL/min — ABNORMAL LOW (ref >60)
Glucose, Bld: 105 mg/dL — ABNORMAL HIGH (ref 70–99)
Phosphorus: 5 mg/dL — ABNORMAL HIGH (ref 2.5–4.6)
Potassium: 4.1 mmol/L (ref 3.5–5.1)
Sodium: 132 mmol/L — ABNORMAL LOW (ref 135–145)

## 2023-03-11 LAB — C DIFFICILE (CDIFF) QUICK SCRN (NO PCR REFLEX)
C Diff antigen: POSITIVE — AB
C Diff toxin: NEGATIVE

## 2023-03-11 MED ORDER — RISAQUAD PO CAPS
2.0000 | ORAL_CAPSULE | Freq: Three times a day (TID) | ORAL | Status: DC
  Administered 2023-03-11 – 2023-03-17 (×17): 2 via ORAL
  Filled 2023-03-11 (×18): qty 2

## 2023-03-11 MED ORDER — CHOLESTYRAMINE 4 G PO PACK
4.0000 g | PACK | Freq: Two times a day (BID) | ORAL | Status: DC
  Administered 2023-03-11 – 2023-03-16 (×7): 4 g via ORAL
  Filled 2023-03-11 (×12): qty 1

## 2023-03-11 MED ORDER — DICLOFENAC EPOLAMINE 1.3 % EX PTCH
1.0000 | MEDICATED_PATCH | Freq: Two times a day (BID) | CUTANEOUS | Status: DC
  Administered 2023-03-11 – 2023-04-04 (×32): 1 via TRANSDERMAL
  Filled 2023-03-11 (×55): qty 1

## 2023-03-11 MED ORDER — HYDROMORPHONE HCL 2 MG PO TABS
3.0000 mg | ORAL_TABLET | ORAL | Status: DC | PRN
  Administered 2023-03-11 – 2023-03-15 (×9): 3 mg via ORAL
  Filled 2023-03-11 (×10): qty 2

## 2023-03-11 MED ORDER — BACID PO TABS
2.0000 | ORAL_TABLET | Freq: Three times a day (TID) | ORAL | Status: DC
  Filled 2023-03-11 (×2): qty 2

## 2023-03-11 MED ORDER — CHLORHEXIDINE GLUCONATE CLOTH 2 % EX PADS
6.0000 | MEDICATED_PAD | Freq: Every day | CUTANEOUS | Status: DC
  Administered 2023-03-11 – 2023-03-13 (×3): 6 via TOPICAL
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
## 2023-03-11 NOTE — Progress Notes (Signed)
Bilateral upper extremity vein mapping has been completed. Preliminary results can be found in CV Proc through chart review.   03/11/23 12:36 PM Olen Cordial RVT

## 2023-03-11 NOTE — Progress Notes (Signed)
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 Back surgery 2021 with screws placed complicated by discitis at T10-11, T4-T5 as well as corpectomy L1 previously staph epi as well as Pseudomonas completing daptomycin 08/09/2022  Recent hospitalization 3/31 through 4/7 with osteomyelitis discitis thoracic spine-neurosurgery at the time did not feel she would be a candidate and she grew MRSE and was started on daptomycin Placed and was due to complete Rx 5/31  Represented 4/29 with worsening back pain nausea vomiting Bicarb 16--BUN/creatinine 53/4.6--Hemoglobin 9 platelet 142--iNR 1.3 Lactic acid normal Ammonia normal Blood cultures pending CT ABD chest pelvis = T4-T5 osteomyelitis discitis with continued paraspinal soft tissue density MRI = new osteomyelitis T3 with vertebral collapse T4 since prior MRI 12/2022 5/2 discussion with Dr. Maisie Fus neurosurgery recommends medical management increase in size of suspected dorsal epidural fluid collection 7 mm 5/3 underwent disc aspiration--end of treatment 04/13/2023 follow-up Dr. Judie Petit. 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 difficile not infectious diarrhea Continues to have semiformed stool, C. difficile performed 5/12 false positive Bulk up stool as is able to stop laxatives--add Questran, Bifidobacterium  ATN/AKI on CKD 4 nongap acidosis followed previously by nephrology until 06/2021 Initial creatinine 8 on admission-Multifactorial insults + urinary retention/hypotension + BOOP Temp HD cath placed --underwent HD on 5/3, 5/4  temp cath removed 5/8 2/2 Bicarb as per renal Cre rising, --Going for mapping and planning for HD as per renal Vascular to place to tunneled dialysis cath on 5/13-patient not a great candidate for  graft but may be candidate for fistula chronic indolent infection  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 and will need to retry on 5/15 voiding trial  Hypokalemia Resolved--watch K  LLE wound Examined on 5/9 Continue dressings with Aquacel-appreciate WOC nurse input   Chronic thrombocytopenia with platelets of 130 Last CT abdomen pelvis performed 05/2022 shows cirrhotic liver borderline splenomegaly Mild elevation alk phos transaminases are normal-no further workup at this stage--OP elastography etc etc  Normocytic anemia iron studies 5/3 did not show any deficiency and saturations are good-only monitor some component of iatrogenic blood draws causing this Aranesp 40 Mcg Weekly  DM TY 2 with hypoglycemia A1c 4.6 Discontinued all coverage-blood sugars controlled below 180 since 5/6 patient is eating 50 -- 100% of meals 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  HFpEF last echo poor study with poor acoustic windows EF 65-70% HTN Lasix 20 twice daily held, continuing Coreg 6.25 twice daily--titrate as able and adjust downwards  if going to iHD  Chronic lower extremity DVTs (unclear when) currently on apixaban 2.5 twice daily  Imaging in 2017 does not suggest any DVT-had bilateral DVT per her ~ 5 years prior ~ 2019 with back surgeries --watch platelets   Depression/anxiety continue Cymbalta 60 daily, diazepam 5 every 12 as needed anxiety  DVT prophylaxis: Apixaban Code Status: Full Family Communication: no one present today Disposition:  Status is: Inpatient Remains inpatient appropriate because:   Will need CLIP and HD   Subjective:  Stool seems less Complaining of pain in back-we added Flector patch Questions why she is not on Lasix-explained that this may not make a difference to her renal function and may in fact worsen it Eating some  Objective: Vitals:   03/11/23 0500 03/11/23 0526 03/11/23 0812 03/11/23 1010  BP:  (!) 125/54  128/71  Pulse:  61  66  Resp:  18 18 16   Temp:  98.2 F (36.8 C)  97.7 F (36.5 C)  TempSrc:  Oral  Oral  SpO2:  100% 98% 98%  Weight: 83.2 kg     Height:        Intake/Output Summary (Last 24 hours) at 03/11/2023 1614 Last data filed at 03/11/2023 1004 Gross per 24 hour  Intake 480 ml  Output 1625 ml  Net -1145 ml    Filed Weights   03/09/23 0500 03/10/23 0500 03/11/23 0500  Weight: 81 kg 80.3 kg 83.2 kg    Examination:  Flat affect depressed S1-S2 no murmur Chest is clear no wheeze Some tenderness in upper thoracic region posterolaterally with increasing spasm in the paraspinal muscles Abdomen soft no rebound   Data Reviewed: personally reviewed   CBC    Component Value Date/Time   WBC 5.7 03/08/2023 0033   RBC 2.70 (L) 03/08/2023 0033   HGB 7.5 (L) 03/08/2023 0033   HGB 8.3 (L) 01/12/2023 1419   HGB 15.0 11/30/2008 0927   HCT 24.9 (L) 03/08/2023 0033   HCT 43.8 11/30/2008 0927   PLT 126 (L) 03/08/2023 0033   PLT 120 (L) 01/12/2023 1419   PLT 133 (L) 11/30/2008 0927   MCV 92.2 03/08/2023 0033   MCV 83.9 11/30/2008 0927   MCH 27.8  03/08/2023 0033   MCHC 30.1 03/08/2023 0033   RDW 16.4 (H) 03/08/2023 0033   RDW 13.0 11/30/2008 0927   LYMPHSABS 0.6 (L) 02/26/2023 0825   LYMPHSABS 1.2 11/30/2008 0927   MONOABS 0.2 02/26/2023 0825   MONOABS 0.3 11/30/2008 0927   EOSABS 0.1 02/26/2023 0825   EOSABS 0.1 11/30/2008 0927   BASOSABS 0.0 02/26/2023 0825   BASOSABS 0.0 11/30/2008 0927      Latest Ref Rng & Units 03/11/2023    3:09 AM 03/10/2023   12:29 AM 03/09/2023    2:58 AM  CMP  Glucose 70 - 99 mg/dL 956  86  213   BUN 8 - 23 mg/dL 49  44  39   Creatinine 0.44 - 1.00 mg/dL 0.86  5.78  4.69   Sodium 135 - 145 mmol/L 132  134  134   Potassium 3.5 - 5.1 mmol/L 4.1  4.6  4.2   Chloride 98 - 111 mmol/L 101  102  102   CO2 22 - 32 mmol/L 22  23  25    Calcium 8.9 - 10.3 mg/dL 7.6  7.9  8.0      Radiology Studies: VAS [name] UPPER EXT VEIN MAPPING (PRE-OP AVF)  Result Date: 03/11/2023 UPPER EXTREMITY VEIN MAPPING Patient Name:  [name]  Date of Exam:   03/11/2023 Medical Rec #: [id number]          Accession #:    [id number] Date of Birth: 1956/04/16          Patient Gender: F Patient Age:   21 years Exam Location:  [facility] Procedure:      VAS [name] UPPER EXT VEIN MAPPING (PRE-OP AVF) Referring Phys: Lemar Livings --------------------------------------------------------------------------------  Indications: Pre-access. Comparison Study: No prior studies. Performing Technologist: Chanda Busing RVT  Examination Guidelines: A complete evaluation includes B-mode imaging, spectral Doppler, color Doppler, and power Doppler as needed of all accessible portions of each vessel. Bilateral testing is considered an integral part of a complete examination. Limited examinations for reoccurring indications may be performed as noted. +-----------------+-------------+----------+---------+ Right Cephalic   Diameter (cm)Depth (cm)Findings  +-----------------+-------------+----------+---------+ Shoulder             0.22         1.70             +-----------------+-------------+----------+---------+ Prox upper arm       0.32        1.10             +-----------------+-------------+----------+---------+ Mid upper arm        0.35        0.88   branching +-----------------+-------------+----------+---------+ Dist upper arm       0.24        0.46             +-----------------+-------------+----------+---------+ Antecubital fossa    0.27        0.36   branching +-----------------+-------------+----------+---------+ Prox forearm         0.24        0.47   branching +-----------------+-------------+----------+---------+ Mid forearm          0.36        0.26   branching +-----------------+-------------+----------+---------+ Dist forearm         0.17        0.24   branching +-----------------+-------------+----------+---------+ +-----------------+-------------+----------+--------+ Right Basilic    Diameter (cm)Depth (cm)Findings +-----------------+-------------+----------+--------+ Shoulder             0.24        0.19            +-----------------+-------------+----------+--------+ Mid upper arm        0.39        1.80            +-----------------+-------------+----------+--------+ Dist upper arm       0.23        1.30            +-----------------+-------------+----------+--------+ Antecubital fossa    0.16        0.69            +-----------------+-------------+----------+--------+ Prox forearm         0.04        0.22            +-----------------+-------------+----------+--------+ Mid forearm          0.04        0.19            +-----------------+-------------+----------+--------+ Distal forearm       0.04        0.28            +-----------------+-------------+----------+--------+ +-----------------+-------------+----------+---------+ Left Cephalic    Diameter (cm)Depth (cm)Findings  +-----------------+-------------+----------+---------+ Shoulder  0.29        1.60             +-----------------+-------------+----------+---------+ Prox upper arm       0.29        0.51   branching +-----------------+-------------+----------+---------+ Mid upper arm        0.34        0.40             +-----------------+-------------+----------+---------+ Dist upper arm       0.34        0.34             +-----------------+-------------+----------+---------+ Antecubital fossa    0.36        0.54             +-----------------+-------------+----------+---------+ Prox forearm         0.17        0.86   branching +-----------------+-------------+----------+---------+ Mid forearm          0.18        0.29             +-----------------+-------------+----------+---------+ Dist forearm         0.18        0.22             +-----------------+-------------+----------+---------+ +-----------------+-------------+----------+--------+ Left Basilic     Diameter (cm)Depth (cm)Findings +-----------------+-------------+----------+--------+ Shoulder             0.33        2.30            +-----------------+-------------+----------+--------+ Mid upper arm        0.19        1.10            +-----------------+-------------+----------+--------+ Dist upper arm       0.26        1.60            +-----------------+-------------+----------+--------+ Antecubital fossa    0.21        0.98            +-----------------+-------------+----------+--------+ Prox forearm         0.06        0.20            +-----------------+-------------+----------+--------+ Mid forearm          0.05        0.21            +-----------------+-------------+----------+--------+ Distal forearm       0.05        0.14            +-----------------+-------------+----------+--------+ *See table(s) above for measurements and observations.  Diagnosing physician:    Preliminary      Scheduled Meds:  acetaminophen  1,000 mg Oral TID   apixaban  2.5 mg  Oral BID   calcium carbonate  1 tablet Oral Daily   carvedilol  6.25 mg Oral BID WC   Chlorhexidine Gluconate Cloth  6 each Topical Q0600   Chlorhexidine Gluconate Cloth  6 each Topical Q0600   [START ON 03/16/2023] darbepoetin (ARANESP) injection - NON-DIALYSIS  100 mcg Subcutaneous Q Fri-1800   diclofenac  1 patch Transdermal BID   diclofenac Sodium  4 g Topical QID   DULoxetine  60 mg Oral Daily   lidocaine  2 patch Transdermal Q24H   methocarbamol  750 mg Oral TID   mometasone-formoterol  2 puff Inhalation BID   nutrition supplement (JUVEN)  1 packet Oral BID BM   pregabalin  75 mg Oral Daily   sodium chloride flush  10-40 mL Intracatheter Q12H   sodium chloride flush  3 mL Intravenous Q12H   Continuous Infusions:  DAPTOmycin (CUBICIN) 650 mg in sodium chloride 0.9 % IVPB 650 mg (03/11/23 1608)     LOS: 12 days   Time spent: 70  Rhetta Mura, MD [facility] To contact the attending provider between 7A-7P or the covering provider during after hours 7P-7A, please log into the web site [email or web address] and access using universal [place] password for that web site. If you do not have the password, please call the hospital operator.  03/11/2023, 4:14 PM

## 2023-03-11 NOTE — Progress Notes (Signed)
Nephrology Follow-Up Consult note   Assessment/Recommendations: [name] [name] is a/an 67 y.o. adult with a past medical history significant for CKD IV/V, DM2, CHF, DVTs, admitted for osteomyelitis complicated by AKI.       AKI on CKD 5 now ESRD: Baseline creatinine was around 4.5 prior to admission.  AKI likely ATN.  Did require dialysis earlier in the hospitalization.  Had some improvement in kidney function as well as urine output and dialysis catheter was removed.  Now with persistent low urine output and rising creatinine most likely ESRD at this point -No acute need for dialysis today but will plan to start on dialysis again on Monday after [name] placement -Vascular surgery following, appreciate help.  Plan for [name] and consideration of AVF.  If patient does not have adequate vasculature for AVF likely to defer AVG until she finishes her antibiotics -Plan for CLIP next week -vein mapping ordered -Continue to monitor daily Cr, Dose meds for GFR -Monitor Daily I/Os, Daily weight  -Maintain MAP>65 for optimal renal perfusion.  -Avoid nephrotoxic medications including NSAIDs -Use synthetic opioids (Fentanyl/Dilaudid) if needed  Anemia: Likely multifactorial with inflammation and CKD contributing.  Increased ESA to 100 mcg weekly.  Iron replete.  HFpEF: Volume status overall acceptable.  Continue to monitor  Osteomyelitis: On daptomycin.  Per primary team  DM2: Management per primary  CKD BMD: phos acceptable. Ca corrects near normal. F/u PTH     Recommendations conveyed to primary service.    Darnell Level [facility] 03/11/2023 9:38 AM  ___________________________________________________________  CC: osteo  Interval History/Subjective: Patient continues to have pain.  Otherwise no complaints.  Creatinine stable today.   Medications:  Current Facility-Administered Medications  Medication Dose Route Frequency Provider Last Rate Last Admin    acetaminophen (TYLENOL) tablet 1,000 mg  1,000 mg Oral TID Calvert Cantor, MD   1,000 mg at 03/10/23 2232   albuterol (PROVENTIL) (2.5 MG/3ML) 0.083% nebulizer solution 2.5 mg  2.5 mg Inhalation Q6H PRN Synetta Fail, MD       apixaban Everlene Balls) tablet 2.5 mg  2.5 mg Oral BID Tamera Reason, RPH   2.5 mg at 03/10/23 2233   calcium carbonate (TUMS - dosed in mg elemental calcium) chewable tablet 200 mg of elemental calcium  1 tablet Oral Daily Synetta Fail, MD   200 mg of elemental calcium at 03/10/23 0912   carvedilol (COREG) tablet 6.25 mg  6.25 mg Oral BID WC Calvert Cantor, MD   6.25 mg at 03/10/23 0912   Chlorhexidine Gluconate Cloth 2 % PADS 6 each  6 each Topical Q0600 Terrial Rhodes, MD   6 each at 03/11/23 0630   DAPTOmycin (CUBICIN) 650 mg in sodium chloride 0.9 % IVPB  8 mg/kg Intravenous Q48H Cherylin Mylar, RPH 126 mL/hr at 03/09/23 1644 650 mg at 03/09/23 1644   [START ON 03/16/2023] Darbepoetin Alfa (ARANESP) injection 100 mcg  100 mcg Subcutaneous Q Fri-1800 Darnell Level, MD       diazepam (VALIUM) tablet 5 mg  5 mg Oral Q12H PRN Synetta Fail, MD   5 mg at 03/10/23 2233   diclofenac Sodium (VOLTAREN) 1 % topical gel 4 g  4 g Topical QID Calvert Cantor, MD   4 g at 03/10/23 2235   DULoxetine (CYMBALTA) DR capsule 60 mg  60 mg Oral Daily Synetta Fail, MD   60 mg at 03/10/23 0912   hydrALAZINE (APRESOLINE) injection 10 mg  10 mg Intravenous Q6H PRN  Elease Etienne, MD       HYDROmorphone (DILAUDID) tablet 2 mg  2 mg Oral Q4H PRN Rhetta Mura, MD   2 mg at 03/10/23 2232   lidocaine (LIDODERM) 5 % 2 patch  2 patch Transdermal Q24H Synetta Fail, MD   2 patch at 03/10/23 0912   methocarbamol (ROBAXIN) tablet 750 mg  750 mg Oral TID Rhetta Mura, MD   750 mg at 03/10/23 2232   mometasone-formoterol (DULERA) 200-5 MCG/ACT inhaler 2 puff  2 puff Inhalation BID Synetta Fail, MD   2 puff at 03/11/23 1610   naloxone (NARCAN) injection  0.4 mg  0.4 mg Intravenous PRN Hongalgi, Maximino Greenland, MD       nutrition supplement (JUVEN) (JUVEN) powder packet 1 packet  1 packet Oral BID BM Rhetta Mura, MD   1 packet at 03/10/23 1849   ondansetron (ZOFRAN) injection 4 mg  4 mg Intravenous Q6H PRN Calvert Cantor, MD   4 mg at 03/07/23 1029   pregabalin (LYRICA) capsule 75 mg  75 mg Oral Daily Hollice Espy, MD   75 mg at 03/10/23 0912   sodium chloride flush (NS) 0.9 % injection 10-40 mL  10-40 mL Intracatheter Q12H Hongalgi, Anand D, MD   10 mL at 03/10/23 0914   sodium chloride flush (NS) 0.9 % injection 3 mL  3 mL Intravenous Q12H Synetta Fail, MD   3 mL at 03/10/23 2200      Review of Systems: 10 systems reviewed and negative except per interval history/subjective  Physical Exam: Vitals:   03/11/23 0526 03/11/23 0812  BP: (!) 125/54   Pulse: 61   Resp: 18 18  Temp: 98.2 F (36.8 C)   SpO2: 100% 98%   No intake/output data recorded.  Intake/Output Summary (Last 24 hours) at 03/11/2023 9604 Last data filed at 03/11/2023 0630 Gross per 24 hour  Intake 480 ml  Output 400 ml  Net 80 ml   Constitutional: well-appearing, no acute distress ENMT: ears and nose without scars or lesions, MMM CV: normal rate, trace edema in ble Respiratory: bilateral chest rise, normal work of breathing Gastrointestinal: soft, non-tender, no palpable masses or hernias Skin: no visible lesions or rashes Psych: alert, judgement/insight appropriate, appropriate mood and affect   Test Results I personally reviewed new and old clinical labs and radiology tests Lab Results  Component Value Date   NA 132 (L) 03/11/2023   K 4.1 03/11/2023   CL 101 03/11/2023   CO2 22 03/11/2023   BUN 49 (H) 03/11/2023   CREATININE 4.96 (H) 03/11/2023   GFR 9.63 (LL) 12/13/2022   CALCIUM 7.6 (L) 03/11/2023   ALBUMIN 2.3 (L) 03/11/2023   PHOS 5.0 (H) 03/11/2023    CBC Recent Labs  Lab 03/06/23 0329 03/07/23 0043 03/08/23 0033  WBC 5.1  4.8 5.7  HGB 7.5* 7.5* 7.5*  HCT 24.7* 24.7* 24.9*  MCV 91.5 91.1 92.2  PLT 130* 130* 126*

## 2023-03-12 ENCOUNTER — Encounter (HOSPITAL_COMMUNITY): Admission: EM | Disposition: E | Payer: Self-pay | Source: Home / Self Care | Attending: Internal Medicine

## 2023-03-12 ENCOUNTER — Other Ambulatory Visit: Payer: Self-pay

## 2023-03-12 ENCOUNTER — Inpatient Hospital Stay (HOSPITAL_COMMUNITY): Payer: PRIVATE HEALTH INSURANCE

## 2023-03-12 ENCOUNTER — Encounter: Payer: PRIVATE HEALTH INSURANCE | Admitting: Physical Medicine & Rehabilitation

## 2023-03-12 ENCOUNTER — Encounter (HOSPITAL_COMMUNITY): Payer: Self-pay | Admitting: Internal Medicine

## 2023-03-12 ENCOUNTER — Inpatient Hospital Stay (HOSPITAL_COMMUNITY): Payer: PRIVATE HEALTH INSURANCE | Admitting: Anesthesiology

## 2023-03-12 DIAGNOSIS — N186 End stage renal disease: Secondary | ICD-10-CM | POA: Diagnosis not present

## 2023-03-12 DIAGNOSIS — D631 Anemia in chronic kidney disease: Secondary | ICD-10-CM

## 2023-03-12 DIAGNOSIS — E1122 Type 2 diabetes mellitus with diabetic chronic kidney disease: Secondary | ICD-10-CM | POA: Diagnosis not present

## 2023-03-12 DIAGNOSIS — I120 Hypertensive chronic kidney disease with stage 5 chronic kidney disease or end stage renal disease: Secondary | ICD-10-CM

## 2023-03-12 DIAGNOSIS — Z992 Dependence on renal dialysis: Secondary | ICD-10-CM

## 2023-03-12 DIAGNOSIS — Z794 Long term (current) use of insulin: Secondary | ICD-10-CM

## 2023-03-12 DIAGNOSIS — N185 Chronic kidney disease, stage 5: Secondary | ICD-10-CM | POA: Diagnosis not present

## 2023-03-12 DIAGNOSIS — M4624 Osteomyelitis of vertebra, thoracic region: Secondary | ICD-10-CM | POA: Diagnosis not present

## 2023-03-12 HISTORY — PX: INSERTION OF DIALYSIS CATHETER: SHX1324

## 2023-03-12 HISTORY — PX: ULTRASOUND GUIDANCE FOR VASCULAR ACCESS: SHX6516

## 2023-03-12 LAB — CBC
HCT: 24.5 % — ABNORMAL LOW (ref 36.0–46.0)
Hemoglobin: 7.1 g/dL — ABNORMAL LOW (ref 12.0–15.0)
MCH: 27.8 pg (ref 26.0–34.0)
MCHC: 29 g/dL — ABNORMAL LOW (ref 30.0–36.0)
MCV: 96.1 fL (ref 80.0–100.0)
Platelets: 123 10*3/uL — ABNORMAL LOW (ref 150–400)
RBC: 2.55 MIL/uL — ABNORMAL LOW (ref 3.87–5.11)
RDW: 17.5 % — ABNORMAL HIGH (ref 11.5–15.5)
WBC: 4.8 10*3/uL (ref 4.0–10.5)
nRBC: 0 % (ref 0.0–0.2)

## 2023-03-12 LAB — GLUCOSE, CAPILLARY
Glucose-Capillary: 69 mg/dL — ABNORMAL LOW (ref 70–99)
Glucose-Capillary: 147 mg/dL — ABNORMAL HIGH (ref 70–99)
Glucose-Capillary: 69 mg/dL — ABNORMAL LOW (ref 70–99)
Glucose-Capillary: 110 mg/dL — ABNORMAL HIGH (ref 70–99)
Glucose-Capillary: 89 mg/dL (ref 70–99)
Glucose-Capillary: 92 mg/dL (ref 70–99)
Glucose-Capillary: 96 mg/dL (ref 70–99)
Glucose-Capillary: 105 mg/dL — ABNORMAL HIGH (ref 70–99)
Glucose-Capillary: 96 mg/dL (ref 70–99)

## 2023-03-12 LAB — RENAL FUNCTION PANEL
Albumin: 2.6 g/dL — ABNORMAL LOW (ref 3.5–5.0)
Anion gap: 10 (ref 5–15)
BUN: 50 mg/dL — ABNORMAL HIGH (ref 8–23)
CO2: 23 mmol/L (ref 22–32)
Calcium: 7.8 mg/dL — ABNORMAL LOW (ref 8.9–10.3)
Chloride: 104 mmol/L (ref 98–111)
Creatinine, Ser: 4.54 mg/dL — ABNORMAL HIGH (ref 0.44–1.00)
GFR, Estimated: 10 mL/min — ABNORMAL LOW (ref >60)
Glucose, Bld: 110 mg/dL — ABNORMAL HIGH (ref 70–99)
Phosphorus: 4.6 mg/dL (ref 2.5–4.6)
Potassium: 5 mmol/L (ref 3.5–5.1)
Sodium: 137 mmol/L (ref 135–145)

## 2023-03-12 SURGERY — INSERTION OF DIALYSIS CATHETER
Anesthesia: General | Site: Chest

## 2023-03-12 MED ORDER — FUROSEMIDE 10 MG/ML IJ SOLN
40.0000 mg | Freq: Once | INTRAMUSCULAR | Status: AC
  Administered 2023-03-12: 40 mg via INTRAVENOUS
  Filled 2023-03-12: qty 4

## 2023-03-12 MED ORDER — DEXTROSE 50 % IV SOLN
INTRAVENOUS | Status: AC
  Filled 2023-03-12: qty 50

## 2023-03-12 MED ORDER — LIDOCAINE 2% (20 MG/ML) 5 ML SYRINGE
INTRAMUSCULAR | Status: DC | PRN
  Administered 2023-03-12: 40 mg via INTRAVENOUS

## 2023-03-12 MED ORDER — PHENYLEPHRINE HCL (PRESSORS) 10 MG/ML IV SOLN
INTRAVENOUS | Status: AC
  Filled 2023-03-12: qty 1

## 2023-03-12 MED ORDER — INSULIN ASPART 100 UNIT/ML IJ SOLN: 0.0000 [IU] | INTRAMUSCULAR | Status: DC | PRN

## 2023-03-12 MED ORDER — HEPARIN 6000 UNIT IRRIGATION SOLUTION
Status: AC
  Filled 2023-03-12: qty 500

## 2023-03-12 MED ORDER — DEXTROSE 5 % IV SOLN: INTRAVENOUS | Status: DC

## 2023-03-12 MED ORDER — FENTANYL CITRATE (PF) 100 MCG/2ML IJ SOLN: 25.0000 ug | INTRAMUSCULAR | Status: DC | PRN

## 2023-03-12 MED ORDER — CHLORHEXIDINE GLUCONATE 0.12 % MT SOLN
OROMUCOSAL | Status: AC
  Administered 2023-03-12: 15 mL via OROMUCOSAL
  Filled 2023-03-12: qty 15

## 2023-03-12 MED ORDER — PROPOFOL 10 MG/ML IV BOLUS
INTRAVENOUS | Status: DC | PRN
  Administered 2023-03-12: 100 mg via INTRAVENOUS

## 2023-03-12 MED ORDER — PHENYLEPHRINE 80 MCG/ML (10ML) SYRINGE FOR IV PUSH (FOR BLOOD PRESSURE SUPPORT)
PREFILLED_SYRINGE | INTRAVENOUS | Status: DC | PRN
  Administered 2023-03-12 (×2): 80 ug via INTRAVENOUS
  Administered 2023-03-12: 160 ug via INTRAVENOUS

## 2023-03-12 MED ORDER — EPHEDRINE SULFATE-NACL 50-0.9 MG/10ML-% IV SOSY
PREFILLED_SYRINGE | INTRAVENOUS | Status: DC | PRN
  Administered 2023-03-12 (×2): 10 mg via INTRAVENOUS

## 2023-03-12 MED ORDER — OXYCODONE HCL 5 MG/5ML PO SOLN: 5.0000 mg | Freq: Once | ORAL | Status: DC | PRN

## 2023-03-12 MED ORDER — OXYCODONE HCL 5 MG PO TABS: 5.0000 mg | ORAL_TABLET | Freq: Once | ORAL | Status: DC | PRN

## 2023-03-12 MED ORDER — FUROSEMIDE 10 MG/ML IJ SOLN
40.0000 mg | Freq: Two times a day (BID) | INTRAMUSCULAR | Status: DC
  Administered 2023-03-12 – 2023-03-19 (×14): 40 mg via INTRAVENOUS
  Filled 2023-03-12 (×15): qty 4

## 2023-03-12 MED ORDER — PHENYLEPHRINE HCL-NACL 20-0.9 MG/250ML-% IV SOLN
INTRAVENOUS | Status: DC | PRN
  Administered 2023-03-12: 40 ug/min via INTRAVENOUS

## 2023-03-12 MED ORDER — ORAL CARE MOUTH RINSE: 15.0000 mL | Freq: Once | OROMUCOSAL | Status: AC

## 2023-03-12 MED ORDER — ONDANSETRON HCL 4 MG/2ML IJ SOLN
INTRAMUSCULAR | Status: AC
  Filled 2023-03-12: qty 2

## 2023-03-12 MED ORDER — FENTANYL CITRATE (PF) 250 MCG/5ML IJ SOLN
INTRAMUSCULAR | Status: DC | PRN
  Administered 2023-03-12: 50 ug via INTRAVENOUS

## 2023-03-12 MED ORDER — MIDAZOLAM HCL 2 MG/2ML IJ SOLN
INTRAMUSCULAR | Status: AC
  Filled 2023-03-12: qty 2

## 2023-03-12 MED ORDER — CHLORHEXIDINE GLUCONATE 0.12 % MT SOLN: 15.0000 mL | Freq: Once | OROMUCOSAL | Status: AC

## 2023-03-12 MED ORDER — ONDANSETRON HCL 4 MG/2ML IJ SOLN
INTRAMUSCULAR | Status: DC | PRN
  Administered 2023-03-12: 4 mg via INTRAVENOUS

## 2023-03-12 MED ORDER — FENTANYL CITRATE (PF) 250 MCG/5ML IJ SOLN
INTRAMUSCULAR | Status: AC
  Filled 2023-03-12: qty 5

## 2023-03-12 MED ORDER — DEXTROSE 50 % IV SOLN
12.5000 g | INTRAVENOUS | Status: AC
  Administered 2023-03-12: 12.5 g via INTRAVENOUS

## 2023-03-12 MED ORDER — FUROSEMIDE 10 MG/ML IJ SOLN
20.0000 mg | Freq: Once | INTRAMUSCULAR | Status: AC
  Administered 2023-03-12: 20 mg via INTRAVENOUS
  Filled 2023-03-12: qty 2

## 2023-03-12 MED ORDER — HEPARIN SODIUM (PORCINE) 1000 UNIT/ML IJ SOLN
INTRAMUSCULAR | Status: AC
  Filled 2023-03-12: qty 10

## 2023-03-12 MED ORDER — SODIUM CHLORIDE 0.9 % IV SOLN: INTRAVENOUS | Status: DC

## 2023-03-12 MED ORDER — MIDAZOLAM HCL 2 MG/2ML IJ SOLN
INTRAMUSCULAR | Status: DC | PRN
  Administered 2023-03-12: 2 mg via INTRAVENOUS

## 2023-03-12 MED ORDER — HEPARIN SODIUM (PORCINE) 1000 UNIT/ML IJ SOLN
INTRAMUSCULAR | Status: DC | PRN
  Administered 2023-03-12: 4100 [IU]

## 2023-03-12 MED ORDER — HEPARIN 6000 UNIT IRRIGATION SOLUTION
Status: DC | PRN
  Administered 2023-03-12: 1

## 2023-03-12 MED ORDER — MIDAZOLAM HCL 2 MG/2ML IJ SOLN: 0.5000 mg | Freq: Once | INTRAMUSCULAR | Status: DC | PRN

## 2023-03-12 SURGICAL SUPPLY — 58 items
ADH SKN CLS APL DERMABOND .7 (GAUZE/BANDAGES/DRESSINGS) ×3
AGENT HMST SPONGE THK3/8 (HEMOSTASIS)
ARMBAND PINK RESTRICT EXTREMIT (MISCELLANEOUS) ×4 IMPLANT
BAG COUNTER SPONGE SURGICOUNT (BAG) ×3 IMPLANT
BAG DECANTER FOR FLEXI CONT (MISCELLANEOUS) ×3 IMPLANT
BAG SPNG CNTER NS LX DISP (BAG) ×3
BIOPATCH RED 1 DISK 7.0 (GAUZE/BANDAGES/DRESSINGS) ×3 IMPLANT
BLADE CLIPPER SURG (BLADE) ×2 IMPLANT
CANISTER SUCT 3000ML PPV (MISCELLANEOUS) ×3 IMPLANT
CATH PALINDROME-P 19CM W/VT (CATHETERS) IMPLANT
CATH PALINDROME-P 23CM W/VT (CATHETERS) IMPLANT
CATH PALINDROME-P 28CM W/VT (CATHETERS) ×1 IMPLANT
CATH STRAIGHT 5FR 65CM (CATHETERS) IMPLANT
CLIP TI MEDIUM 6 (CLIP) ×2 IMPLANT
CLIP TI WIDE RED SMALL 6 (CLIP) ×2 IMPLANT
COVER PROBE W GEL 5X96 (DRAPES) ×3 IMPLANT
COVER SURGICAL LIGHT HANDLE (MISCELLANEOUS) ×3 IMPLANT
DERMABOND ADVANCED .7 DNX12 (GAUZE/BANDAGES/DRESSINGS) ×3 IMPLANT
DRAPE C-ARM 42X72 X-RAY (DRAPES) ×3 IMPLANT
DRAPE CHEST BREAST 15X10 FENES (DRAPES) ×3 IMPLANT
ELECT REM PT RETURN 9FT ADLT (ELECTROSURGICAL) ×3
ELECTRODE REM PT RTRN 9FT ADLT (ELECTROSURGICAL) ×3 IMPLANT
GAUZE 4X4 16PLY ~~LOC~~+RFID DBL (SPONGE) ×3 IMPLANT
GLOVE BIO SURGEON STRL SZ7.5 (GLOVE) ×3 IMPLANT
GLOVE BIOGEL PI IND STRL 8 (GLOVE) ×3 IMPLANT
GOWN STRL REUS W/ TWL LRG LVL3 (GOWN DISPOSABLE) ×6 IMPLANT
GOWN STRL REUS W/ TWL XL LVL3 (GOWN DISPOSABLE) ×6 IMPLANT
GOWN STRL REUS W/TWL LRG LVL3 (GOWN DISPOSABLE) ×6
GOWN STRL REUS W/TWL XL LVL3 (GOWN DISPOSABLE) ×6
HEMOSTAT SPONGE AVITENE ULTRA (HEMOSTASIS) IMPLANT
KIT BASIN OR (CUSTOM PROCEDURE TRAY) ×3 IMPLANT
KIT PALINDROME-P 55CM (CATHETERS) IMPLANT
KIT TURNOVER KIT B (KITS) ×3 IMPLANT
NDL 18GX1X1/2 (RX/OR ONLY) (NEEDLE) ×2 IMPLANT
NDL HYPO 25GX1X1/2 BEV (NEEDLE) ×2 IMPLANT
NEEDLE 18GX1X1/2 (RX/OR ONLY) (NEEDLE) IMPLANT
NEEDLE HYPO 25GX1X1/2 BEV (NEEDLE) IMPLANT
NS IRRIG 1000ML POUR BTL (IV SOLUTION) ×3 IMPLANT
PACK BASIC III (CUSTOM PROCEDURE TRAY) ×3
PACK CV ACCESS (CUSTOM PROCEDURE TRAY) ×3 IMPLANT
PACK SRG BSC III STRL LF ECLPS (CUSTOM PROCEDURE TRAY) ×3 IMPLANT
PAD ARMBOARD 7.5X6 YLW CONV (MISCELLANEOUS) ×6 IMPLANT
SET MICROPUNCTURE 5F STIFF (MISCELLANEOUS) IMPLANT
SLING ARM FOAM STRAP LRG (SOFTGOODS) IMPLANT
SLING ARM FOAM STRAP MED (SOFTGOODS) IMPLANT
SOAP 2 % CHG 4 OZ (WOUND CARE) ×3 IMPLANT
SPIKE FLUID TRANSFER (MISCELLANEOUS) ×3 IMPLANT
SUT ETHILON 3 0 PS 1 (SUTURE) ×3 IMPLANT
SUT MNCRL AB 4-0 PS2 18 (SUTURE) ×3 IMPLANT
SUT PROLENE 6 0 BV (SUTURE) ×2 IMPLANT
SUT PROLENE 7 0 BV 1 (SUTURE) IMPLANT
SUT VIC AB 3-0 SH 27 (SUTURE)
SUT VIC AB 3-0 SH 27X BRD (SUTURE) ×2 IMPLANT
SYR 10ML LL (SYRINGE) ×3 IMPLANT
TOWEL GREEN STERILE (TOWEL DISPOSABLE) ×3 IMPLANT
TOWEL GREEN STERILE FF (TOWEL DISPOSABLE) ×3 IMPLANT
UNDERPAD 30X36 HEAVY ABSORB (UNDERPADS AND DIAPERS) ×3 IMPLANT
WATER STERILE IRR 1000ML POUR (IV SOLUTION) ×3 IMPLANT

## 2023-03-12 NOTE — Progress Notes (Signed)
[facility]  Subjective  -no complaints   Objective (!) 117/48 72 97.8 F (36.6 C) (Oral) 16 99%  Intake/Output Summary (Last 24 hours) at 03/12/2023 1251 Last data filed at 03/12/2023 0436 Gross per 24 hour  Intake 840 ml  Output 1750 ml  Net -910 ml    Right chest wall central venous catheter  Laboratory Lab Results: No results for input(s): "WBC", "HGB", "HCT", "PLT" in the last 72 hours. BMET Recent Labs    03/11/23 0309 03/12/23 0500  NA 132* 137  K 4.1 5.0  CL 101 104  CO2 22 23  GLUCOSE 105* 110*  BUN 49* 50*  CREATININE 4.96* 4.54*  CALCIUM 7.6* 7.8*    COAG Lab Results  Component Value Date   INR 1.3 (H) 02/26/2023   INR 1.2 01/30/2023   INR 1.2 06/23/2022   No results found for: "PTT"  Assessment/Planning:  Patient scheduled for tunneled dialysis catheter and AV fistula.  She got Eliquis yesterday including last night.  Discussed we will cancel her fistula due to eliquis dosing.  I will place a tunneled dialysis catheter.  She has a tunneled central line on the right so would need to go to the left neck or the groin.  Risk and benefits discussed.    Cephus Shelling 03/12/2023 12:51 PM --

## 2023-03-12 NOTE — Transfer of Care (Signed)
Immediate Anesthesia Transfer of Care Note  Patient: [name] [name]  Procedure(s) Performed: INSERTION OF LEFT INTERNAL JUGULAR TUNNELED  DIALYSIS CATHETER (Chest) ULTRASOUND GUIDANCE FOR VASCULAR ACCESS (Left: Chest)  Patient Location: PACU  Anesthesia Type:General  Level of Consciousness: drowsy and patient cooperative  Airway & Oxygen Therapy: Patient Spontanous Breathing and Patient connected to nasal cannula oxygen  Post-op Assessment: Report given to RN and Post -op Vital signs reviewed and stable  Post vital signs: Reviewed and stable  Last Vitals:  Vitals Value Taken Time  BP 136/65 03/12/23 1445  Temp 36.4 C 03/12/23 1430  Pulse 75 03/12/23 1448  Resp 11 03/12/23 1448  SpO2 95 % 03/12/23 1448  Vitals shown include unvalidated device data.  Last Pain:  Vitals:   03/12/23 1255  TempSrc: Oral  PainSc:       Patients Stated Pain Goal: 2 (03/12/23 1246)  Complications: No notable events documented.

## 2023-03-12 NOTE — Progress Notes (Signed)
  Progress Note    03/12/2023 7:26 AM * No surgery date entered *   Complaining of some shortness of breath this morning and chest discomfort RN going to give her a breathing treatment She is scheduled for AVF this morning and TDC She is left hand dominant but discussed with her that we want to avoid graft and her best option based on vein mapping is left  Cephalic Her questions were answered regarding surgery today Keep NPO. Okay for sips with morning meds Consent order placed  Dory Horn [facility] [phone] 03/12/2023 7:26 AM

## 2023-03-12 NOTE — Anesthesia Preprocedure Evaluation (Addendum)
Anesthesia Evaluation  Patient identified by MRN, date of birth, ID band Patient awake    Reviewed: Allergy & Precautions, NPO status , Patient's Chart, lab work & pertinent test results, reviewed documented beta blocker date and time   History of Anesthesia Complications Negative for: history of anesthetic complications  Airway Mallampati: II  TM Distance: >3 FB Neck ROM: Full    Dental  (+) Dental Advisory Given   Pulmonary sleep apnea (BiPAP)    breath sounds clear to auscultation       Cardiovascular hypertension, Pt. on medications and Pt. on home beta blockers (-) angina + DVT  (-) CAD  Rhythm:Regular Rate:Normal  '23 ECHO: EF 65-70%. The LV has normal function, no regional wall motion abnormalities. There is moderate asymmetric LVH.  RVF is normal. The right ventricular size is normal.  Trivial MR.     Neuro/Psych   Anxiety Depression    negative neurological ROS     GI/Hepatic ,GERD  Controlled,,(+) Cirrhosis         Endo/Other  diabetes (glu 89), Insulin Dependent  BMI 36  Renal/GU ESRF and DialysisRenal disease (K+ 5.0)H/o renal transplant     Musculoskeletal  (+) Arthritis ,  Fibromyalgia -  Abdominal  (+) + obese  Peds  Hematology  (+) Blood dyscrasia (Hb 7.5), anemia eliquis   Anesthesia Other Findings   Reproductive/Obstetrics                              Anesthesia Physical Anesthesia Plan  ASA: 3  Anesthesia Plan: General   Post-op Pain Management: Tylenol PO (pre-op)*   Induction: Intravenous  PONV Risk Score and Plan: 3 and Ondansetron, Dexamethasone and Treatment may vary due to age or medical condition  Airway Management Planned: LMA  Additional Equipment: None  Intra-op Plan:   Post-operative Plan:   Informed Consent: I have reviewed the patients History and Physical, chart, labs and discussed the procedure including the risks, benefits and  alternatives for the proposed anesthesia with the patient or authorized representative who has indicated his/her understanding and acceptance.     Dental advisory given  Plan Discussed with: CRNA and Surgeon  Anesthesia Plan Comments:          Anesthesia Quick Evaluation

## 2023-03-12 NOTE — Op Note (Signed)
Date: Mar 12, 2023  Preoperative diagnosis: End-stage renal disease  Postoperative diagnosis: Same  Procedure: 1.  Ultrasound-guided access left internal jugular vein 2.  Placement of left internal jugular vein tunneled dialysis catheter (28 cm palindrome)  Surgeon: Dr. Cephus Shelling, MD  Assistant: OR staff  Indications: 67 year old female with new end-stage renal disease.  Vascular surgery was asked to place tunneled dialysis catheter and permanent hemodialysis access.  She presents today for placement of a tunneled dialysis catheter.  I will delay placement of her fistula as her Eliquis was given last night.  Findings: Ultrasound-guided access left internal jugular vein.  Placement of 28 cm palindrome catheter with tip in the right atrium under fluoroscopic guidance.  This flushed and aspirated easily.  Anesthesia: LMA  Details: Patient was taken to the operating room after informed consent was obtained.  Placed on the operative table in supine position.  After anesthesia was induced the left neck was then prepped and draped in standard sterile fashion.  Antibiotics were up-to-date.  Timeout was performed.  I evaluated the left internal jugular vein with ultrasound, it was patent, and image was saved.  This was accessed with an 18-gauge needle and ultimately I got venous backbleeding.  I then advanced the J-wire into the right atrium under fluoroscopic guidance.  I measured a 28 cm palindrome catheter and made a counterincision on the left chest wall.  I then tunneled from the chest wall exit site of the catheter to the internal jugular vein stick site in the neck.  I made sure the cuff was under the skin.  I then dilated over the wire and placed a large dilator peel-away sheath.  The inner dilator was then removed including the wire.  I then advanced the catheter into the right atrium under fluoroscopic guidance and the sheath was peeled away.  I pulled the catheter back to make sure  it was not kinked.  This flushed and aspirated easily.  It was secured with multiple 3-0 nylon's as well as a 4-0 Monocryl at the neck insertion site.  It was loaded with a heparinized saline according manufacturer recommendations.  Complication: None  Condition: Stable  Cephus Shelling, MD [facility] Office: [phone]   Cephus Shelling

## 2023-03-12 NOTE — Anesthesia Procedure Notes (Signed)
Procedure Name: LMA Insertion Date/Time: 03/12/2023 1:42 PM  Performed by: Gus Puma, CRNAPre-anesthesia Checklist: Patient identified, Emergency Drugs available, Suction available and Patient being monitored Patient Re-evaluated:Patient Re-evaluated prior to induction Oxygen Delivery Method: Circle System Utilized Preoxygenation: Pre-oxygenation with 100% oxygen Induction Type: IV induction Ventilation: Mask ventilation without difficulty LMA: LMA inserted LMA Size: 4.0 Number of attempts: 1 Airway Equipment and Method: Bite block Placement Confirmation: positive ETCO2 Tube secured with: Tape Dental Injury: Teeth and Oropharynx as per pre-operative assessment

## 2023-03-12 NOTE — Progress Notes (Signed)
Good discussions bedside detailing what palliative care is as well as current scenario patient is in Her goals are to get to her grandson wedding, go home see her dog.  I mentioned dialysis is indicated but she has a life-limiting illness with infection and chronic pain  Will consult palliative care to see for both pain management and to talk about goals of care  She confirms full code  I have talked to the difficulties of HD , her indolent underlying infection and overall FTT and the need for  mobility to get to HD three times/week etc  Husband wants me to "be more positive"  I agreed we can be +, we also need to present facts as they are and be clear about clinical scenarios and what is possible and what isn't   We ended with a good understanding of limitations that modern medicine has for some of her treatable but incurable illnesses.  > 30 min  Pleas Koch, MD [facility] 6:26 PM

## 2023-03-12 NOTE — Progress Notes (Signed)
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 Back surgery 2021 with screws placed complicated by discitis at T10-11, T4-T5 as well as corpectomy L1 previously staph epi as well as Pseudomonas completing daptomycin 08/09/2022  Recent hospitalization 3/31 through 4/7 with osteomyelitis discitis thoracic spine-neurosurgery stated nonoperative-she grew MRSE and was started on daptomycin-due to complete Rx 5/31  Represented 4/29 with worsening back pain nausea vomiting Bicarb 16--BUN/creatinine 53/4.6--Hemoglobin 9 platelet 142--iNR 1.3 Lactic acid normal Ammonia normal Blood cultures pending  CT ABD chest pelvis = T4-T5 osteomyelitis discitis with continued paraspinal soft tissue density MRI = new osteomyelitis T3 with vertebral collapse T4 since prior MRI 12/2022  5/2 discussion with Dr. Maisie Fus neurosurgery recommends medical management increase in size of suspected dorsal epidural fluid collection 7 mm 5/3 underwent disc aspiration--end of treatment 04/13/2023 follow-up Dr. Judie Petit. Thedore Mins 6/13 Nephrology consulted because of AKI on admission  Planning it seems for HD as is probably ESRD now 5/13 [facility] placed Dr. 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 Lasix 40 IV then 20 IV given-slight improvement after first dose  DM TY 2 with hypoglycemia A1c 4.6 Hypoglycemia because n.p.o. 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 difficile not infectious diarrhea Continues to have semiformed stool, C. difficile performed 5/12 false positive Bulk up stool as is able to stop laxatives--add Questran, Bifidobacterium  ATN/AKI on CKD 4 nongap acidosis followed previously by nephrology until 06/2021 Initial creatinine 8 on admission-Multifactorial insults + urinary retention/hypotension + BOOP Temp HD cath placed --underwent HD on 5/3, 5/4  ----Temp cath removed 5/8 2/2 Bicarb as per renal Cre rising, --Going for mapping and planning for HD as per renal--Lasix as above Vascular to place to tunneled dialysis cath on 5/13-patient not a great candidate for  graft but may be candidate for fistula chronic indolent infection  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 and will need to retry on 5/15 voiding trial  Hypokalemia Becoming hyperkalemic-needs dialysis soon  LLE wound Examined on 5/9 Continue dressings with Aquacel-appreciate WOC nurse input  Wound examined 5/13 looks quite clean  Chronic thrombocytopenia with platelets of 130 Last CT abdomen pelvis performed 05/2022 shows cirrhotic liver borderline splenomegaly Mild elevation alk phos transaminases are normal-periodic LFT's  Normocytic anemia iron studies 5/3 did not show any  deficiency and saturations are good-only monitor --iatrogenic blood draws causing this  also Aranesp 40 Mcg Weekly  HFpEF last echo poor study with poor acoustic windows EF 65-70% HTN Intermittent Lasix being given continuing Coreg 6.25 twice daily--titrate as able and adjust downwards if going to iHD  Chronic lower extremity DVTs (unclear when) currently on apixaban 2.5 twice daily  Imaging in 2017 does not suggest any DVT-had bilateral DVT per her ~ 5 years prior ~ 2019 with back surgeries --watch platelets   Depression/anxiety continue Cymbalta 60 daily, diazepam 5 every 12 as needed anxiety  DVT prophylaxis: Apixaban Code Status: Full Family Communication: no one present today will need to have a discussion with the patient's husband about long-term planning and call him later this evening Disposition:  Status is: Inpatient Remains inpatient appropriate because:   Not feeling well today not ready for clip HD   Subjective:  Short of breath+ back pain+ Received 2 doses of Lasix and feels better after passing some urine Some mild chest discomfort radiating from the back  Objective: Vitals:   03/12/23 0500 03/12/23 0701 03/12/23 0827 03/12/23 0829  BP:  (!) 121/45  (!) 117/48  Pulse:  79 80 72  Resp:   18 16  Temp:    97.8 F (36.6 C)  TempSrc:    Oral  SpO2:  100%  99%  Weight: 81.5 kg     Height:        Intake/Output Summary (Last 24 hours) at 03/12/2023 1038 Last data filed at 03/12/2023 0436 Gross per 24 hour  Intake 840 ml  Output 1750 ml  Net -910 ml    Filed Weights   03/10/23 0500 03/11/23 0500 03/12/23 0500  Weight: 80.3 kg 83.2 kg 81.5 kg    Examination:  Flat affect depressed S1-S2 no murmur Chest is clear no wheeze Reproducible pain in the chest on pressure Abdomen soft no rebound Left foot wound examined and looks quite clean   Data Reviewed: personally reviewed   CBC    Component Value Date/Time   WBC 5.7 03/08/2023 0033   RBC 2.70  (L) 03/08/2023 0033   HGB 7.5 (L) 03/08/2023 0033   HGB 8.3 (L) 01/12/2023 1419   HGB 15.0 11/30/2008 0927   HCT 24.9 (L) 03/08/2023 0033   HCT 43.8 11/30/2008 0927   PLT 126 (L) 03/08/2023 0033   PLT 120 (L) 01/12/2023 1419   PLT 133 (L) 11/30/2008 0927   MCV 92.2 03/08/2023 0033   MCV 83.9 11/30/2008 0927   MCH 27.8 03/08/2023 0033   MCHC 30.1 03/08/2023 0033   RDW 16.4 (H) 03/08/2023 0033   RDW 13.0 11/30/2008 0927   LYMPHSABS 0.6 (L) 02/26/2023 0825   LYMPHSABS 1.2 11/30/2008 0927   MONOABS 0.2 02/26/2023 0825   MONOABS 0.3 11/30/2008 0927   EOSABS 0.1 02/26/2023 0825   EOSABS 0.1 11/30/2008 0927   BASOSABS 0.0 02/26/2023 0825   BASOSABS 0.0 11/30/2008 0927      Latest Ref Rng & Units 03/12/2023    5:00 AM 03/11/2023    3:09 AM 03/10/2023   12:29 AM  CMP  Glucose 70 - 99 mg/dL 308  657  86   BUN 8 - 23 mg/dL 50  49  44   Creatinine 0.44 - 1.00 mg/dL 8.46  9.62  9.52   Sodium 135 - 145 mmol/L 137  132  134   Potassium 3.5 - 5.1 mmol/L 5.0  4.1  4.6   Chloride 98 - 111 mmol/L 104  101  102   CO2 22 - 32 mmol/L  23  22  23    Calcium 8.9 - 10.3 mg/dL 7.8  7.6  7.9      Radiology Studies: DG CHEST PORT 1 VIEW  Result Date: 03/12/2023 CLINICAL DATA:  Pneumonia EXAM: PORTABLE CHEST 1 VIEW COMPARISON:  01/31/2023 FINDINGS: Right internal jugular approach central venous catheter terminates at the level of the distal SVC. Stable heart size. No focal airspace consolidation, pleural effusion, or pneumothorax. IMPRESSION: No active disease. Electronically Signed   By: Duanne Guess D.O.   On: 03/12/2023 10:05   VAS [name] UPPER EXT VEIN MAPPING (PRE-OP AVF)  Result Date: 03/11/2023 UPPER EXTREMITY VEIN MAPPING Patient Name:  [name] [name]  Date of Exam:   03/11/2023 Medical Rec #: [id number]          Accession #:    [id number] Date of Birth: 1956-07-31          Patient Gender: F Patient Age:   15 years Exam Location:  [facility] Procedure:      VAS [name] UPPER EXT VEIN  MAPPING (PRE-OP AVF) Referring Phys: Lemar Livings --------------------------------------------------------------------------------  Indications: Pre-access. Comparison Study: No prior studies. Performing Technologist: Chanda Busing RVT  Examination Guidelines: A complete evaluation includes B-mode imaging, spectral Doppler, color Doppler, and power Doppler as needed of all accessible portions of each vessel. Bilateral testing is considered an integral part of a complete examination. Limited examinations for reoccurring indications may be performed as noted. +-----------------+-------------+----------+---------+ Right Cephalic   Diameter (cm)Depth (cm)Findings  +-----------------+-------------+----------+---------+ Shoulder             0.22        1.70             +-----------------+-------------+----------+---------+ Prox upper arm       0.32        1.10             +-----------------+-------------+----------+---------+ Mid upper arm        0.35        0.88   branching +-----------------+-------------+----------+---------+ Dist upper arm       0.24        0.46             +-----------------+-------------+----------+---------+ Antecubital fossa    0.27        0.36   branching +-----------------+-------------+----------+---------+ Prox forearm         0.24        0.47   branching +-----------------+-------------+----------+---------+ Mid forearm          0.36        0.26   branching +-----------------+-------------+----------+---------+ Dist forearm         0.17        0.24   branching +-----------------+-------------+----------+---------+ +-----------------+-------------+----------+--------+ Right Basilic    Diameter (cm)Depth (cm)Findings +-----------------+-------------+----------+--------+ Shoulder             0.24        0.19            +-----------------+-------------+----------+--------+ Mid upper arm        0.39        1.80             +-----------------+-------------+----------+--------+ Dist upper arm       0.23        1.30            +-----------------+-------------+----------+--------+ Antecubital fossa    0.16        0.69            +-----------------+-------------+----------+--------+ Prox forearm  0.04        0.22            +-----------------+-------------+----------+--------+ Mid forearm          0.04        0.19            +-----------------+-------------+----------+--------+ Distal forearm       0.04        0.28            +-----------------+-------------+----------+--------+ +-----------------+-------------+----------+---------+ Left Cephalic    Diameter (cm)Depth (cm)Findings  +-----------------+-------------+----------+---------+ Shoulder             0.29        1.60             +-----------------+-------------+----------+---------+ Prox upper arm       0.29        0.51   branching +-----------------+-------------+----------+---------+ Mid upper arm        0.34        0.40             +-----------------+-------------+----------+---------+ Dist upper arm       0.34        0.34             +-----------------+-------------+----------+---------+ Antecubital fossa    0.36        0.54             +-----------------+-------------+----------+---------+ Prox forearm         0.17        0.86   branching +-----------------+-------------+----------+---------+ Mid forearm          0.18        0.29             +-----------------+-------------+----------+---------+ Dist forearm         0.18        0.22             +-----------------+-------------+----------+---------+ +-----------------+-------------+----------+--------+ Left Basilic     Diameter (cm)Depth (cm)Findings +-----------------+-------------+----------+--------+ Shoulder             0.33        2.30            +-----------------+-------------+----------+--------+ Mid upper arm        0.19        1.10             +-----------------+-------------+----------+--------+ Dist upper arm       0.26        1.60            +-----------------+-------------+----------+--------+ Antecubital fossa    0.21        0.98            +-----------------+-------------+----------+--------+ Prox forearm         0.06        0.20            +-----------------+-------------+----------+--------+ Mid forearm          0.05        0.21            +-----------------+-------------+----------+--------+ Distal forearm       0.05        0.14            +-----------------+-------------+----------+--------+ *See table(s) above for measurements and observations.  Diagnosing physician:    Preliminary      Scheduled Meds:  dextrose       acetaminophen  1,000 mg Oral TID   acidophilus  2 capsule Oral TID   calcium carbonate  1 tablet Oral Daily   carvedilol  6.25 mg Oral BID WC   Chlorhexidine Gluconate Cloth  6 each Topical Q0600   Chlorhexidine Gluconate Cloth  6 each Topical Q0600   cholestyramine  4 g Oral BID   [START ON 03/16/2023] darbepoetin (ARANESP) injection - NON-DIALYSIS  100 mcg Subcutaneous Q Fri-1800   diclofenac  1 patch Transdermal BID   diclofenac Sodium  4 g Topical QID   DULoxetine  60 mg Oral Daily   lidocaine  2 patch Transdermal Q24H   methocarbamol  750 mg Oral TID   mometasone-formoterol  2 puff Inhalation BID   nutrition supplement (JUVEN)  1 packet Oral BID BM   pregabalin  75 mg Oral Daily   sodium chloride flush  10-40 mL Intracatheter Q12H   sodium chloride flush  3 mL Intravenous Q12H   Continuous Infusions:  DAPTOmycin (CUBICIN) 650 mg in sodium chloride 0.9 % IVPB 650 mg (03/11/23 1608)   dextrose       LOS: 13 days   Time spent: 63  Rhetta Mura, MD [facility] To contact the attending provider between 7A-7P or the covering provider during after hours 7P-7A, please log into the web site [email or web address] and access using universal [place]  password for that web site. If you do not have the password, please call the hospital operator.  03/12/2023, 10:38 AM

## 2023-03-12 NOTE — Anesthesia Postprocedure Evaluation (Signed)
Anesthesia Post Note  Patient: Prudy Fratello  Procedure(s) Performed: INSERTION OF LEFT INTERNAL JUGULAR TUNNELED  DIALYSIS CATHETER (Chest) ULTRASOUND GUIDANCE FOR VASCULAR ACCESS (Left: Chest)     Patient location during evaluation: PACU Anesthesia Type: General Level of consciousness: awake and alert, patient cooperative and oriented Pain management: pain level controlled Vital Signs Assessment: post-procedure vital signs reviewed and stable Respiratory status: spontaneous breathing, nonlabored ventilation, respiratory function stable and patient connected to nasal cannula oxygen Cardiovascular status: blood pressure returned to baseline and stable Postop Assessment: no apparent nausea or vomiting Anesthetic complications: no   No notable events documented.  Last Vitals:  Vitals:   03/12/23 1445 03/12/23 1500  BP: 136/65 119/88  Pulse: 79 74  Resp: 17 11  Temp:  (!) 36.4 C  SpO2: 95% 94%    Last Pain:  Vitals:   03/12/23 1255  TempSrc: Oral  PainSc:                  Flavius Repsher,E. Yan Okray

## 2023-03-12 NOTE — Progress Notes (Signed)
Requested to see pt for out-pt HD needs at d/c. Pt currently unavailable so contacted pt's husband via phone. Husband currently at work but was able to discuss (briefly) out-pt HD options for pt. Pt's husband states he needs to discuss options further with pt and navigator will f/u with pt/pt's husband tomorrow. Will assist as needed.   Olivia Canter Renal Navigator [phone]

## 2023-03-12 NOTE — Progress Notes (Signed)
[facility] Progress Note   Assessment/ Plan:   New ESRD: Baseline creatinine was around 4.5 prior to admission.  AKI likely ATN.  Did require dialysis earlier in the hospitalization.  Had some improvement in kidney function as well as urine output and dialysis catheter was removed.  Now with persistent low urine output and rising creatinine most likely ESRD at this point  -[facility] placement today, appreciate assistance -Vascular surgery following, appreciate help.  Plan for [facility] and consideration of AVF.  If patient does not have adequate vasculature for AVF likely to defer AVG until she finishes her antibiotics -CLIP in process - HD today 03/12/23 -Continue to monitor daily Cr, Dose meds for GFR -Monitor Daily I/Os, Daily weight  -Maintain MAP>65 for optimal renal perfusion.  -Avoid nephrotoxic medications including NSAIDs -Use synthetic opioids (Fentanyl/Dilaudid) if needed   Anemia: Likely multifactorial with inflammation and CKD contributing.  Increased ESA to 100 mcg weekly.  Iron replete.   HFpEF: Volume status overall acceptable.  Continue to monitor   Osteomyelitis: On daptomycin.  Per primary team   DM2: Management per primary   CKD BMD: phos acceptable. Ca corrects near normal. F/u PTH    Subjective:    Seen in room- about to go to surgery    Objective:   BP 111/87   Pulse 70   Temp 97.9 F (36.6 C) (Oral)   Resp 18   Ht 4\' 11"  (1.499 m)   Wt 81.5 kg   SpO2 95%   BMI 36.29 kg/m   Physical Exam: Gen:NAD CVS: RRR Resp: clear Abd: soft Ext: no  LE edema  Labs: BMET Recent Labs  Lab 03/06/23 0329 03/07/23 0043 03/08/23 0033 03/09/23 0258 03/10/23 0029 03/11/23 0309 03/12/23 0500  NA 135 135 136 134* 134* 132* 137  K 3.3* 3.4* 4.2 4.2 4.6 4.1 5.0  CL 101 103 104 102 102 101 104  CO2 26 24 25 25 23 22 23   GLUCOSE 84 100* 126* 106* 86 105* 110*  BUN 24* 24* 27* 39* 44* 49* 50*  CREATININE 3.79* 3.96* 4.63* 4.89* 5.03* 4.96* 4.54*   CALCIUM 7.7* 7.9* 7.9* 8.0* 7.9* 7.6* 7.8*  PHOS 2.9 3.0 3.0 4.1 4.5 5.0* 4.6   CBC Recent Labs  Lab 03/06/23 0329 03/07/23 0043 03/08/23 0033  WBC 5.1 4.8 5.7  HGB 7.5* 7.5* 7.5*  HCT 24.7* 24.7* 24.9*  MCV 91.5 91.1 92.2  PLT 130* 130* 126*      Medications:     [MAR Hold] acetaminophen  1,000 mg Oral TID   [MAR Hold] acidophilus  2 capsule Oral TID   [MAR Hold] calcium carbonate  1 tablet Oral Daily   [MAR Hold] carvedilol  6.25 mg Oral BID WC   [MAR Hold] Chlorhexidine Gluconate Cloth  6 each Topical Q0600   [MAR Hold] Chlorhexidine Gluconate Cloth  6 each Topical Q0600   [MAR Hold] cholestyramine  4 g Oral BID   [MAR Hold] darbepoetin (ARANESP) injection - NON-DIALYSIS  100 mcg Subcutaneous Q Fri-1800   dextrose       [MAR Hold] diclofenac  1 patch Transdermal BID   [MAR Hold] DULoxetine  60 mg Oral Daily   [MAR Hold] lidocaine  2 patch Transdermal Q24H   [MAR Hold] methocarbamol  750 mg Oral TID   [MAR Hold] mometasone-formoterol  2 puff Inhalation BID   [MAR Hold] nutrition supplement (JUVEN)  1 packet Oral BID BM   [MAR Hold] pregabalin  75 mg Oral Daily   [  MAR Hold] sodium chloride flush  10-40 mL Intracatheter Q12H   [MAR Hold] sodium chloride flush  3 mL Intravenous Q12H     Bufford Buttner MD 03/12/2023, 1:44 PM

## 2023-03-13 ENCOUNTER — Encounter (HOSPITAL_COMMUNITY): Payer: Self-pay | Admitting: Vascular Surgery

## 2023-03-13 DIAGNOSIS — M4624 Osteomyelitis of vertebra, thoracic region: Secondary | ICD-10-CM | POA: Diagnosis not present

## 2023-03-13 LAB — RENAL FUNCTION PANEL
Albumin: 2.6 g/dL — ABNORMAL LOW (ref 3.5–5.0)
Anion gap: 9 (ref 5–15)
BUN: 28 mg/dL — ABNORMAL HIGH (ref 8–23)
CO2: 25 mmol/L (ref 22–32)
Calcium: 7.9 mg/dL — ABNORMAL LOW (ref 8.9–10.3)
Chloride: 102 mmol/L (ref 98–111)
Creatinine, Ser: 3.06 mg/dL — ABNORMAL HIGH (ref 0.44–1.00)
GFR, Estimated: 16 mL/min — ABNORMAL LOW (ref >60)
Glucose, Bld: 79 mg/dL (ref 70–99)
Phosphorus: 4.1 mg/dL (ref 2.5–4.6)
Potassium: 4 mmol/L (ref 3.5–5.1)
Sodium: 136 mmol/L (ref 135–145)

## 2023-03-13 LAB — TYPE AND SCREEN

## 2023-03-13 LAB — CK: Total CK: 51 U/L (ref 38–234)

## 2023-03-13 LAB — GLUCOSE, CAPILLARY
Glucose-Capillary: 97 mg/dL (ref 70–99)
Glucose-Capillary: 139 mg/dL — ABNORMAL HIGH (ref 70–99)
Glucose-Capillary: 97 mg/dL (ref 70–99)
Glucose-Capillary: 175 mg/dL — ABNORMAL HIGH (ref 70–99)

## 2023-03-13 LAB — PREPARE RBC (CROSSMATCH)

## 2023-03-13 LAB — PARATHYROID HORMONE, INTACT (NO CA): PTH: 405 pg/mL — ABNORMAL HIGH (ref 15–65)

## 2023-03-13 LAB — BPAM RBC: ISSUE DATE / TIME: 202405031255

## 2023-03-13 MED ORDER — PENTAFLUOROPROP-TETRAFLUOROETH EX AERO: 1.0000 | INHALATION_SPRAY | CUTANEOUS | Status: DC | PRN

## 2023-03-13 MED ORDER — MELATONIN 3 MG PO TABS
3.0000 mg | ORAL_TABLET | Freq: Every day | ORAL | Status: DC
  Administered 2023-03-13: 3 mg via ORAL
  Filled 2023-03-13: qty 1

## 2023-03-13 MED ORDER — LIDOCAINE-PRILOCAINE 2.5-2.5 % EX CREA: 1.0000 | TOPICAL_CREAM | CUTANEOUS | Status: DC | PRN

## 2023-03-13 MED ORDER — VANCOMYCIN HCL IN DEXTROSE 1-5 GM/200ML-% IV SOLN
1000.0000 mg | INTRAVENOUS | Status: AC
  Administered 2023-03-14: 1000 mg via INTRAVENOUS
  Filled 2023-03-13 (×2): qty 200

## 2023-03-13 MED ORDER — HEPARIN SODIUM (PORCINE) 1000 UNIT/ML DIALYSIS
1000.0000 [IU] | INTRAMUSCULAR | Status: DC | PRN
  Administered 2023-03-14 – 2023-04-02 (×4): 4200 [IU] via INTRAVENOUS_CENTRAL
  Filled 2023-03-13 (×8): qty 1

## 2023-03-13 MED ORDER — LIDOCAINE HCL (PF) 1 % IJ SOLN: 5.0000 mL | INTRAMUSCULAR | Status: DC | PRN

## 2023-03-13 MED ORDER — ALTEPLASE 2 MG IJ SOLR: 2.0000 mg | Freq: Once | INTRAMUSCULAR | Status: DC | PRN

## 2023-03-13 MED ORDER — HEPARIN SODIUM (PORCINE) 1000 UNIT/ML IJ SOLN
INTRAMUSCULAR | Status: AC
  Administered 2023-03-13: 4200 [IU]
  Filled 2023-03-13: qty 4

## 2023-03-13 MED ORDER — SODIUM CHLORIDE 0.9% IV SOLUTION: Freq: Once | INTRAVENOUS | Status: DC

## 2023-03-13 MED ORDER — HYDROMORPHONE HCL 2 MG PO TABS
1.0000 mg | ORAL_TABLET | Freq: Three times a day (TID) | ORAL | Status: DC
  Administered 2023-03-13 – 2023-03-15 (×5): 1 mg via ORAL
  Filled 2023-03-13 (×6): qty 1

## 2023-03-13 NOTE — Progress Notes (Signed)
   03/13/23 0100  Pain Assessment  Pain Scale 0-10  Pain Score 0  Hemodialysis Catheter Left Subclavian Double lumen Permanent (Tunneled)  Placement Date/Time: 03/12/23 1402   Placed prior to admission: No  Serial / Lot #: [id number]  Expiration Date: 08/26/24  Time Out: Correct patient;Correct site;Correct procedure  Maximum sterile barrier precautions: Hand hygiene;Cap;Mask;Sterile...  Site Condition No complications  Blue Lumen Status Dead end cap in place;Heparin locked;Flushed  Red Lumen Status Flushed;Dead end cap in place;Heparin locked  Catheter fill solution Heparin 1000 units/ml  Catheter fill volume (Arterial) 2.1 cc  Catheter fill volume (Venous) 2.1  Dressing Type Transparent  Dressing Status Antimicrobial disc in place;Clean, Dry, Intact  Drainage Description None  Dressing Change Due 03/18/23  Post treatment catheter status Capped and Clamped  Neurological  Level of Consciousness Alert  Orientation Level Oriented X4  Respiratory  Respiratory Pattern Regular;Unlabored  Chest Assessment Chest expansion symmetrical  Bilateral Breath Sounds Clear;Diminished  Cardiac  Pulse Regular  Heart Sounds S1, S2  Jugular Venous Distention (JVD) No  ECG Monitor Yes  Cardiac Rhythm NSR  Vascular  R Radial Pulse +2  L Radial Pulse +2  GU Assessment  Genitourinary (WDL) X  Genitourinary Symptoms Existing urinary catheter (Copious amount of urine in drainage bag)  Psychosocial  Psychosocial (WDL) WDL   Received patient in bed to unit. * yes  Alert and oriented. - yes Informed consent signed and in chart. - yes  TX duration:2.30  Patient tolerated well. - yes Transported back to the room - yes Alert, without acute distress. - yes Hand-off given to patient's nurse. - Could not contact nurse  Access used: Lt chest perm cath Access issues: None  Total UF removed: 0 Medication(s) given: None Post HD VS: see VS flowsheet Post HD weight: 81.5kg   Abednego Yeates A  [facility]

## 2023-03-13 NOTE — Progress Notes (Signed)
[facility]  Subjective  -no complaints.  States she wishes to pursue dialysis.   Objective (!) 174/85 83 98 F (36.7 C) (Oral) 16 97%  Intake/Output Summary (Last 24 hours) at 03/13/2023 0729 Last data filed at 03/13/2023 0626 Gross per 24 hour  Intake 923 ml  Output 1250 ml  Net -327 ml    Left IJ TDC with no hematoma  Laboratory Lab Results: Recent Labs    03/12/23 2255  WBC 4.8  HGB 7.1*  HCT 24.5*  PLT 123*   BMET Recent Labs    03/11/23 0309 03/12/23 0500  NA 132* 137  K 4.1 5.0  CL 101 104  CO2 22 23  GLUCOSE 105* 110*  BUN 49* 50*  CREATININE 4.96* 4.54*  CALCIUM 7.6* 7.8*    COAG Lab Results  Component Value Date   INR 1.3 (H) 02/26/2023   INR 1.2 01/30/2023   INR 1.2 06/23/2022   No results found for: "PTT"  Assessment/Planning:  67 year old female then underwent placement of left IJ TDC yesterday for ESRD.  No issues with the catheter.  Patient states she wants to proceed with dialysis.  Plan left arm AV fistula tomorrow.  Please keep n.p.o. after midnight.  Consent order placed in the chart.  Cephus Shelling 03/13/2023 7:29 AM --

## 2023-03-13 NOTE — Progress Notes (Signed)
Occupational Therapy Treatment Patient Details Name: [name] [name] MRN: [id number] DOB: Jun 26, 1956 Today's Date: 03/13/2023   History of present illness Pt is 67 year old who is moslty wheelchair bound and has CKD stage V, hypertension, cirrhosis of the liver, OSA, morbid obesity, L1 osteomyelitis status post L1 corpectomy, T10-T11 discitis osteomyelitis status post posterior arthrodesis of T8-T11 and removal of T11 pedicle screws and rod in 12/23.  Last month, diagnosed with T4-T5 discitis osteomyelitis which was being treated with daptomycin.  Neurosurgery did not recommend any further interventions.     The patient returns to the hospital for worsening back pain for [redacted] week along with an episode of vomiting.  Pt found to have AKI requiring 2 cycles of HD.  Also, with ongoing osteomyelitis/discitis Thoracic spine with T4-5 disc aspiration by IR on 5/3.  Neurosurgery recommends further medical management, no surgical intervention at this time.   OT comments  Pt not feeling well today, in a lot of pain, states 8/10 in B shoulders and back/sides, limited by pain. Pt able to perform exercises in bed and some bed mobility turning L/R, requires cueing to maintain back precautions. Pt appears to have decreased attention/focus on activities, and not able to perform exercises on L side and then figure out how to perform on R side without multiple demonstrations, no carryover of learned exercises. Pt too tired to perform OOB activities. Pt would benefit greatly from continued skilled OT to improve endurance/strength to improve overall functional independence and participation in ADLs.   Recommendations for follow up therapy are one component of a multi-disciplinary discharge planning process, led by the attending physician.  Recommendations may be updated based on patient status, additional functional criteria and insurance authorization.    Assistance Recommended at Discharge    Patient can return home with  the following      Equipment Recommendations       Recommendations for Other Services      Precautions / Restrictions Precautions Precautions: Back Precaution Booklet Issued: No Precaution Comments: verbally reviewed precautions Required Braces or Orthoses: Spinal Brace Spinal Brace: Thoracolumbosacral orthotic Spinal Brace Comments: No orders but pt had from prior admission and wears when OOB Restrictions Weight Bearing Restrictions: No       Mobility Bed Mobility Overal bed mobility: Needs Assistance Bed Mobility: Rolling, Sidelying to Sit, Sit to Sidelying Rolling: Supervision Sidelying to sit: Min assist     Sit to sidelying: Min guard      Transfers                         Balance                                           ADL either performed or assessed with clinical judgement   ADL                                              Extremity/Trunk Assessment Upper Extremity Assessment Upper Extremity Assessment: Generalized weakness            Vision       Perception     Praxis      Cognition Arousal/Alertness: Awake/alert Behavior During Therapy: WFL for tasks assessed/performed Overall Cognitive Status: Impaired/Different from  baseline Area of Impairment: Safety/judgement, Problem solving                   Current Attention Level: Selective     Safety/Judgement: Decreased awareness of safety   Problem Solving: Slow processing, Difficulty sequencing General Comments: Pt breaks back precautions at times even with repeated cueing, requires multiple demonstrations of exercises, not able to carry over learned technique or figure out how to perform the same exercise from L side to the R side, etc.        Exercises Exercises: General Upper Extremity General Exercises - Upper Extremity Shoulder Flexion: AROM, Both, 20 reps, Supine, Theraband Theraband Level (Shoulder Flexion): Level 2  (Red) Shoulder Horizontal ABduction: AROM, Both, 20 reps, Theraband Theraband Level (Shoulder Horizontal Abduction): Level 2 (Red) Elbow Flexion: AROM, Both, 20 reps, Theraband Theraband Level (Elbow Flexion): Level 2 (Red) Elbow Extension: AROM, Both, 20 reps, Theraband Theraband Level (Elbow Extension): Level 2 (Red)    Shoulder Instructions       General Comments      Pertinent Vitals/ Pain       Pain Assessment Pain Assessment: 0-10 Pain Score: 8  Faces Pain Scale: Hurts even more Pain Location: back, chest, and R side Pain Descriptors / Indicators: Grimacing, Guarding Pain Intervention(s): Limited activity within patient's tolerance, Monitored during session  Home Living                                          Prior Functioning/Environment              Frequency           Progress Toward Goals  OT Goals(current goals can now be found in the care plan section)  Progress towards OT goals: Progressing toward goals  Acute Rehab OT Goals Patient Stated Goal: to decrease pain and improve BUE strength OT Goal Formulation: With patient Time For Goal Achievement: 03/19/23 Potential to Achieve Goals: Good ADL Goals Pt Will Perform Grooming: with set-up;sitting Pt Will Transfer to Toilet: with supervision;regular height toilet;stand pivot transfer;bedside commode  Plan      Co-evaluation                 AM-PAC OT "6 Clicks" Daily Activity     Outcome Measure                    End of Session        Activity Tolerance     Patient Left     Nurse Communication          Time: 1345-1401 OT Time Calculation (min): 16 min  Charges: OT General Charges $OT Visit: 1 Visit OT Treatments $Therapeutic Exercise: 8-22 mins  Zabella Wease, OTR/L   Sabeen Piechocki R Serai Tukes 03/13/2023, 2:15 PM

## 2023-03-13 NOTE — Progress Notes (Signed)
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 Back surgery 2021 with screws placed complicated by discitis at T10-11, T4-T5 as well as corpectomy L1 previously staph epi as well as Pseudomonas completing daptomycin 08/09/2022  Recent hospitalization 3/31 through 4/7 with osteomyelitis discitis thoracic spine-neurosurgery stated nonoperative-she grew MRSE and was started on daptomycin-due to complete Rx 5/31  Represented 4/29 with worsening back pain nausea vomiting Bicarb 16--BUN/creatinine 53/4.6--Hemoglobin 9 platelet 142--iNR 1.3 Lactic acid normal Ammonia normal Blood cultures pending  CT ABD chest pelvis = T4-T5 osteomyelitis discitis with continued paraspinal soft tissue density MRI = new osteomyelitis T3 with vertebral collapse T4 since prior MRI 12/2022  5/2 discussion with Dr. Maisie Fus neurosurgery recommends medical management increase in size of suspected dorsal epidural fluid collection 7 mm 5/3 underwent disc aspiration--end of treatment 04/13/2023 follow-up Dr. Judie Petit. Thedore Mins 6/13 Nephrology consulted because of AKI on admission  Planning it seems for HD as is probably ESRD now 5/13 [facility] placed Dr. Chestine Spore, palliative consulted for goals of care in addition to symptom management/pain med recommendations 5/14 will need transfusion 1 unit PRBC-notified nephrology  Currently awaiting dialysis clip in the next several days and likely can discharge   Hospital-Problem based course  Osteomyelitis  discitis thoracic spine persistent at T4, T5 +7 mm abscess/phlegmon negative blood culture 4/29 Biopsy results   [-] no growth to date from aspiration Per ID-complete IV daptomycin 04/13/2023, OP F/u 04/12/2023 IV Dilaudid DC 5/8 Pain control  Opiates--> Dilaudid 1 mg 3 times daily scheduled + Dilaudid to 3 mg every 4 as needed on 5/12, add Flector patch to Lidoderm patch, DC topical creams---  continue Lyrica cautiously 75 twice daily Robaxin to 750 3 times daily given spasm and pain- her pain seems better controlled compared to yesterday  Undifferentiated SOB ?fluid overload/PNA CXR 5/13 suggests neither--already on Dapto. Lasix 40 IV then 20 IV given-slight improvement after first dose See below discussion under ESRD  ATN/AKI on causing ESRD now declared so Patient will have fistula placed under Dr. Chestine Spore of vascular 5/15 Looks like patient will continue to get dialysis and we are currently awaiting clip  DM TY 2 with hypoglycemia A1c 4.6 Hypoglycemia because n.p.o. 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 difficile not infectious diarrhea Continues to have semiformed stool, C. difficile performed 5/12 false positive Bulk up stool as is able to stop laxatives--add Questran, Bifidobacterium  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 and will need to retry on 5/15 voiding trial Clamp Foley orders in place for 5/15 a.m.  Hypokalemia Becoming hyperkalemic-needs dialysis soon  LLE wound Examined on 5/9 Continue dressings with Aquacel-appreciate WOC nurse input  Wound examined 5/13 looks quite clean  Chronic thrombocytopenia with platelets of 130 Last CT abdomen pelvis performed 05/2022 shows cirrhotic liver borderline splenomegaly Mild elevation alk phos transaminases are normal-periodic LFT's  Normocytic anemia iron studies 5/3 did not show any deficiency and saturations are good-only  monitor --iatrogenic blood draws causing this also Hemoglobin has drifted down  to 7.1, we will type and screen for transfusion Aranesp 40 Mcg Weekly  HFpEF last echo poor study with poor acoustic windows EF 65-70% HTN Intermittent Lasix being given  Coreg discontinued 5/13 secondary to mild hypotension  Chronic lower extremity DVTs (unclear when) currently on apixaban 2.5 twice daily  Imaging in 2017 does not suggest any DVT-had bilateral DVT per her ~ 5 years prior ~ 2019 with back surgeries --watch platelets   Depression/anxiety continue Cymbalta 60 daily, diazepam 5 every 12 as needed anxiety  DVT prophylaxis: Apixaban Code Status: Full Family Communication:  See my separate note from 5/13 good discussion with patient's husband regarding goals of care etc.-palliative medicine to follow-up with them as per their note Disposition:  Status is: Inpatient Remains inpatient appropriate because:   Awaiting clipped and stability prior to discharge   Subjective:  Less short of breath back pain seems better controlled on scheduled Dilaudid She seems comfortable with the plan for fistula in the morning No other real complaints other than her chronic back pain  Objective: Vitals:   03/13/23 0100 03/13/23 0447 03/13/23 0835 03/13/23 0843  BP: (!) 172/72 (!) 174/85 (!) 147/67   Pulse: 76 83 72   Resp: 11 16 16    Temp: 97.6 F (36.4 C) 98 F (36.7 C) 98.1 F (36.7 C)   TempSrc: Oral Oral Axillary   SpO2: 100% 97% 99% 97%  Weight:  83.8 kg    Height:        Intake/Output Summary (Last 24 hours) at 03/13/2023 1534 Last data filed at 03/13/2023 1334 Gross per 24 hour  Intake 523 ml  Output 3050 ml  Net -2527 ml    Filed Weights   03/11/23 0500 03/12/23 0500 03/13/23 0447  Weight: 83.2 kg 81.5 kg 83.8 kg    Examination:  Brighter affect-on oxygen S1-S2 no murmur Tenderness and back seems less in the paraspinal regions along her upper thoracic spine Chest is clear no  wheeze Abdomen soft no rebound Left foot wound not examined today   Data Reviewed: personally reviewed   CBC    Component Value Date/Time   WBC 4.8 03/12/2023 2255   RBC 2.55 (L) 03/12/2023 2255   HGB 7.1 (L) 03/12/2023 2255   HGB 8.3 (L) 01/12/2023 1419   HGB 15.0 11/30/2008 0927   HCT 24.5 (L) 03/12/2023 2255   HCT 43.8 11/30/2008 0927   PLT 123 (L) 03/12/2023 2255   PLT 120 (L) 01/12/2023 1419   PLT 133 (L) 11/30/2008 0927   MCV 96.1 03/12/2023 2255   MCV 83.9 11/30/2008 0927   MCH 27.8 03/12/2023 2255   MCHC 29.0 (L) 03/12/2023 2255   RDW 17.5 (H) 03/12/2023 2255   RDW 13.0 11/30/2008 0927   LYMPHSABS 0.6 (L) 02/26/2023 0825   LYMPHSABS 1.2 11/30/2008 0927   MONOABS 0.2 02/26/2023 0825   MONOABS 0.3 11/30/2008 0927   EOSABS 0.1 02/26/2023 0825   EOSABS 0.1 11/30/2008 0927   BASOSABS 0.0 02/26/2023 0825   BASOSABS 0.0 11/30/2008 0927      Latest Ref Rng & Units 03/13/2023    9:49 AM 03/12/2023    5:00 AM 03/11/2023    3:09 AM  CMP  Glucose 70 - 99 mg/dL 79  161  096   BUN 8 - 23 mg/dL 28  50  49   Creatinine 0.44 - 1.00 mg/dL 0.45  4.09  8.11   Sodium 135 - 145 mmol/L 136  137  132   Potassium 3.5 - 5.1  mmol/L 4.0  5.0  4.1   Chloride 98 - 111 mmol/L 102  104  101   CO2 22 - 32 mmol/L 25  23  22    Calcium 8.9 - 10.3 mg/dL 7.9  7.8  7.6      Radiology Studies: DG CHEST PORT 1 VIEW  Result Date: 03/12/2023 CLINICAL DATA:  Status post tunneled dialysis catheter placement. EXAM: PORTABLE CHEST 1 VIEW COMPARISON:  0937 hours FINDINGS: Interval placement of tunneled left-sided hemodialysis catheter via jugular vein with catheter tip in the upper right atrium. No pneumothorax. Stable positioning of tunneled central venous catheter via right internal jugular vein with catheter tip in lower SVC. Stable mild elevation of the right hemidiaphragm. No overt edema, airspace consolidation or pleural fluid. Stable heart size. IMPRESSION: Interval placement of tunneled  left-sided hemodialysis catheter with catheter tip in the upper right atrium. No pneumothorax. Electronically Signed   By: Irish Lack M.D.   On: 03/12/2023 15:23   DG C-Arm 1-60 Min-No Report  Result Date: 03/12/2023 Fluoroscopy was utilized by the requesting physician.  No radiographic interpretation.   DG CHEST PORT 1 VIEW  Result Date: 03/12/2023 CLINICAL DATA:  Pneumonia EXAM: PORTABLE CHEST 1 VIEW COMPARISON:  01/31/2023 FINDINGS: Right internal jugular approach central venous catheter terminates at the level of the distal SVC. Stable heart size. No focal airspace consolidation, pleural effusion, or pneumothorax. IMPRESSION: No active disease. Electronically Signed   By: Duanne Guess D.O.   On: 03/12/2023 10:05     Scheduled Meds:  acetaminophen  1,000 mg Oral TID   acidophilus  2 capsule Oral TID   calcium carbonate  1 tablet Oral Daily   Chlorhexidine Gluconate Cloth  6 each Topical Q0600   Chlorhexidine Gluconate Cloth  6 each Topical Q0600   cholestyramine  4 g Oral BID   [START ON 03/16/2023] darbepoetin (ARANESP) injection - NON-DIALYSIS  100 mcg Subcutaneous Q Fri-1800   diclofenac  1 patch Transdermal BID   DULoxetine  60 mg Oral Daily   furosemide  40 mg Intravenous Q12H   HYDROmorphone  1 mg Oral TID   lidocaine  2 patch Transdermal Q24H   melatonin  3 mg Oral QHS   methocarbamol  750 mg Oral TID   mometasone-formoterol  2 puff Inhalation BID   nutrition supplement (JUVEN)  1 packet Oral BID BM   pregabalin  75 mg Oral Daily   sodium chloride flush  10-40 mL Intracatheter Q12H   sodium chloride flush  3 mL Intravenous Q12H   Continuous Infusions:  DAPTOmycin (CUBICIN) 650 mg in sodium chloride 0.9 % IVPB 650 mg (03/11/23 1608)   [START ON 03/14/2023] vancomycin       LOS: 14 days   Time spent: 24  Rhetta Mura, MD [facility] To contact the attending provider between 7A-7P or the covering provider during after hours 7P-7A, please log into  the web site [email or web address] and access using universal [address] password for that web site. If you do not have the password, please call the hospital operator.  03/13/2023, 3:34 PM

## 2023-03-13 NOTE — Progress Notes (Signed)
Met with pt at bedside this morning. Introduced self and explained role. Pt lives home with husband who works. Discussed out-pt HD clinic options. Pt prefers [facility] [place] if there is availability due to closeness to pt's home. Referral submitted to [facility] admissions today for review. Pt states that pt's husband plans to transport pt to/from HD appts and requests a 2nd shift appt for this reason. Will assist as needed.   Olivia Canter Renal Navigator [phone]

## 2023-03-13 NOTE — Progress Notes (Signed)
   03/13/23 2100  Hygiene  CHG  Bath Completed  (patient stated "I do not have time for this". Educated on the need to have a CHG completed to which she responded "I know what it is. I've done it enough".)

## 2023-03-13 NOTE — Progress Notes (Signed)
Physical Therapy Treatment Patient Details Name: [name] [name] MRN: [id number] DOB: 11-22-1955 Today's Date: 03/13/2023   History of Present Illness Pt is 67 year old who is moslty wheelchair bound and has CKD stage V, hypertension, cirrhosis of the liver, OSA, morbid obesity, L1 osteomyelitis status post L1 corpectomy, T10-T11 discitis osteomyelitis status post posterior arthrodesis of T8-T11 and removal of T11 pedicle screws and rod in 12/23.  Last month, diagnosed with T4-T5 discitis osteomyelitis which was being treated with daptomycin.  Neurosurgery did not recommend any further interventions.     The patient returns to the hospital for worsening back pain for [redacted] week along with an episode of vomiting.  Pt found to have AKI requiring 2 cycles of HD.  Also, with ongoing osteomyelitis/discitis Thoracic spine with T4-5 disc aspiration by IR on 5/3.  Neurosurgery recommends further medical management, no surgical intervention at this time.    PT Comments    Pt received in supine and agreeable to session. Pt requiring increased time for all mobility due to pain. Pt instructed in log roll technique and requiring cues for back precautions throughout session due to pt twisting to relieve pain. Pt able to sit to EOB with min A and tolerate sitting for ~3 minutes before requesting to return to supine. Pt reporting dizziness, nausea, and chest pain when sitting EOB with pt stating that she has had chest pain intermittently since being admitted, RN notified. Pt assisted with positioning in R sidelying to relieve back pain. Pt continues to benefit from PT services to progress toward functional mobility goals.     Recommendations for follow up therapy are one component of a multi-disciplinary discharge planning process, led by the attending physician.  Recommendations may be updated based on patient status, additional functional criteria and insurance authorization.     Assistance Recommended at Discharge  Intermittent Supervision/Assistance  Patient can return home with the following A little help with walking and/or transfers;A little help with bathing/dressing/bathroom;Assistance with cooking/housework;Help with stairs or ramp for entrance   Equipment Recommendations  None recommended by PT    Recommendations for Other Services       Precautions / Restrictions Precautions Precautions: Back Precaution Booklet Issued: No Precaution Comments: verbally reviewed precautions Required Braces or Orthoses: Spinal Brace Spinal Brace: Thoracolumbosacral orthotic Spinal Brace Comments: No orders but pt had from prior admission and wears when OOB Restrictions Weight Bearing Restrictions: No     Mobility  Bed Mobility Overal bed mobility: Needs Assistance Bed Mobility: Rolling, Sidelying to Sit, Sit to Sidelying Rolling: Supervision Sidelying to sit: Min assist     Sit to sidelying: Min guard General bed mobility comments: Min A for trunk elevation and dense cues for log roll technique to maintain back precautions. Pt assisted with repositioning in R sidelying to relieve back pain    Transfers                   General transfer comment: pt deferred due to pain and nausea        Balance Overall balance assessment: Needs assistance Sitting-balance support: Feet supported Sitting balance-Leahy Scale: Good Sitting balance - Comments: sitting EOB       Standing balance comment: unable to assess                            Cognition Arousal/Alertness: Awake/alert Behavior During Therapy: WFL for tasks assessed/performed Overall Cognitive Status: Within Functional Limits for tasks assessed  Exercises      General Comments        Pertinent Vitals/Pain Pain Assessment Pain Assessment: Faces Faces Pain Scale: Hurts whole lot Pain Location: back, chest, and R side Pain Descriptors / Indicators:  Grimacing, Guarding Pain Intervention(s): Monitored during session, Repositioned     PT Goals (current goals can now be found in the care plan section) Acute Rehab PT Goals Patient Stated Goal: return home PT Goal Formulation: With patient/family Time For Goal Achievement: 03/20/23 Potential to Achieve Goals: Good Progress towards PT goals: Not progressing toward goals - comment (Pt limited by pain and nausea)    Frequency    Min 3X/week      PT Plan Current plan remains appropriate       AM-PAC PT "6 Clicks" Mobility   Outcome Measure  Help needed turning from your back to your side while in a flat bed without using bedrails?: A Little Help needed moving from lying on your back to sitting on the side of a flat bed without using bedrails?: A Little Help needed moving to and from a bed to a chair (including a wheelchair)?: A Lot Help needed standing up from a chair using your arms (e.g., wheelchair or bedside chair)?: A Lot Help needed to walk in hospital room?: Total Help needed climbing 3-5 steps with a railing? : Total 6 Click Score: 12    End of Session   Activity Tolerance: Patient limited by pain;Other (comment) (limited by nausea) Patient left: in bed;with call bell/phone within reach Nurse Communication: Mobility status PT Visit Diagnosis: Other abnormalities of gait and mobility (R26.89);Muscle weakness (generalized) (M62.81);Pain     Time: 1610-9604 PT Time Calculation (min) (ACUTE ONLY): 23 min  Charges:  $Therapeutic Activity: 23-37 mins                     Johny Shock, PTA Acute Rehabilitation Services Secure Chat Preferred  [phone]    Johny Shock 03/13/2023, 12:35 PM

## 2023-03-13 NOTE — Consult Note (Addendum)
Palliative Care Consult Note                                  Date: 03/13/2023   Patient Name: [name] [name]  DOB: Jun 21, 1956  MRN: [id number]  Age / Sex: 67 y.o., adult  PCP: Donato Schultz, DO Referring Physician: Rhetta Mura, MD  Reason for Consultation: Establishing goals of care, Non pain symptom management, and Pain control  HPI/Patient Profile: : [name] [name] is a 67 y.o. adult with medical history significant of CKD 5,diabetes, neuropathy, thrombocytopenia, OSA, osteomyelitis, diabetic foot infections, anemia, hypothyroidism, hypertension, hyperlipidemia, CAD, cirrhosis, asthma, spinal stenosis, status post lumbar fusion, status post renal transplant (age 68), PTSD, depression, anxiety, obesity, fibromyalgia, DVT, cataracts presenting with worsening back pain, nausea, vomiting. She presented to the hospital for back pain, abdominal pain, nausea, and vomiting and was admitted on 02/26/23.  Palliative Medicine was consulted for goals of care and pain management.  Past Medical History:  Diagnosis Date   Acute MI [facility]) 2007   presented to ED & had cardiac cath- but found to have normal coronaries. Since that point in time her PCP cares f or cardiac needs. Dr. 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    Exertional shortness of breath    Fatty liver    Fibromyalgia    GERD (gastroesophageal reflux disease)    Hepatic steatosis    High cholesterol    History of MI (myocardial infarction) 10/06/2013   Hyperlipidemia 06/03/2008   Qualifier: Diagnosis of  By: Drue Novel MD, Nolon Rod.    Hypertension    MRSA (methicillin resistant Staphylococcus aureus)    Neuropathy    lower legs   Osteoarthritis    hands, hips   Proximal humerus fracture 10/15/2012   Left   PTSD (post-traumatic stress disorder)    Retinopathy 04/17/2019   Sleep apnea 11/16/2017   SOB (shortness of breath) 10/11/2020   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   Qualifier: Diagnosis of  By: Duaine Dredge CMA, Felecia     Type 2 diabetes mellitus with hyperglycemia, with long-term current use of insulin (HCC) 04/06/2017    Subjective:   I have reviewed medical records including EPIC notes, labs and imaging, assessed the patient and then met with the patient to discuss diagnosis prognosis, GOC, EOL wishes, disposition and options.  I introduced Palliative Medicine as specialized medical care for people living with serious illness. It focuses on providing relief from symptoms and stress of a serious illness. The goal is to improve quality of life for both the patient and the family.  Created space and  opportunity for patient  and family to explore thoughts and feelings regarding current medical situation. Values and goals of care important to patient and family were attempted to be elicited.    Patient/Family Understanding of Illness: The patient understands she has worsening kidney function. She says she wants to live and would like to pursue dialysis. We also discussed her osteomyelitis which is not curable without surgical intervention.    Social History: The patient is married with one daughter (whom she does not have a good relationship with) and two grandsons. The patient is spiritual and  open to seeing the chaplain (consult placed).  Functional and Nutritional State: The patient reports that prior to her osteomyelitis she was independent at home.  Palliative Symptoms: Pain: Patient reports chronic back pain which she reports as constant with an occasional burning feeling. She is using Norco at home which is managed by her PCP. She says heat and tylenol work at times for her pain. She is not sure if she has taken any medications to prevent muscle spasms. We discussed that we may not be able go get her chronic pain to a 0/10.  Nausea: Patient reports nausea 3-4 times a week. This usually coincides with her pain.  Anxiety: Patient says her anxiety is managed at home by Valium.   Insomnia: Patient reports she does not sleep well at night.  Dyspnea: Patient reports shortness of breath and is now on oxygen.  Today's Discussion: [name] [name]'s goals are to "live" and "be with her family." Her grandson has an upcoming wedding which she would like to attend. We discussed the difficult road ahead as she navigates multiple comorbidities in addition to her ESRD and osteomyelitis.   Discussed the importance of continued conversation with family and the medical providers regarding overall plan of care and treatment options, ensuring decisions are within the context of the patient's values and GOCs.  Questions and concerns were addressed. The patient was encouraged to call with questions or concerns. PMT will continue to support holistically.  Visited patient later in the day to discuss changes in medications and see why she had not used her prn dilaudid as she described her pain throughout the day. Educated the patient that she could have her prn dilaudid q4hrs, if needed. Encouraged her to utilize her prn when working with PT or OT if it is available.  Review of Systems  Constitutional:  Positive for fatigue.  Respiratory:  Positive for shortness of breath.   Gastrointestinal:  Positive for  diarrhea and nausea.  Musculoskeletal:  Positive for back pain.  Neurological:  Positive for weakness.  Psychiatric/Behavioral:  The patient is nervous/anxious.     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  Neurological:     Mental Status: She is alert.  Psychiatric:        Mood and Affect: Mood normal.     Vital Signs:  BP (!) 147/67 (BP Location: Right Arm)   Pulse 72   Temp 98.1 F (36.7 C) (Axillary)   Resp 16   Ht 4\' 11"  (1.499 m)   Wt 83.8 kg   SpO2 97%   BMI 37.31 kg/m   Palliative Assessment/Data:     Advanced Care Planning:   Existing Vynca/ACP Documentation: None  Primary Decision Maker: The patient is the primary decision maker.   Code Status/Advance Care Planning: Full code  A discussion was had today regarding advanced directives. Concepts specific to code status were had. Recommended consideration of DNR status, understanding evidenced-based poor outcomes in similar hospitalized patients, as the cause of the arrest is likely associated with chronic/terminal disease rather than a reversible acute  cardio-pulmonary event. We encouraged the patient to talk with her husband regarding her code status.  Met with the patient's husband to further discuss code status and encouraged them to discuss overnight. Gave him the "Hard Choices" pamphlet.  Decisions/Changes to ACP: None  Assessment & Plan:   SUMMARY OF RECOMMENDATIONS   Continue conversation regarding code status Plan for PMT to meet with patient's husband on 03/13/23 at 1600 ADD dilaudid 1mg  tab tid ADD melatonin 3mg  nightly  Symptom Management:  Chronic Pain Continue tylenol 1000 mg tid Continue diclofenac (Flector) 1.3% patch bid Continue lidocaine patch 5% 2 patches daily Continue Robaxin 750mg  tid Continue dilaudid 3mg  tab q4hr prn Continue duloxetine (Cymbalta) 60mg  daily Continue pregabalin (Lyrica) 75mg  daily ADD dilaudid 1mg  tab tid Nausea Continue zofran 4mg  IV q6hr prn Anxiety Continue duloxetine (Cymbalta) 60mg  daily Continue pregabalin (Lyrica) 75mg  daily Continue diazepam (Valium) 5 mg q12hr prn Insomnia ADD melatonin 3mg  nightly   Prognosis:  Unable to determine  Discharge Planning:  To Be Determined   Discussed with: Dr. Mahala Menghini    Thank you for allowing [name] to participate in the care of Naveh Marcella Dubs PMT will continue to support holistically.  Time Total: 60 minutes   Signed by: Sarina Ser, NP Palliative Medicine Team  Team Phone # [phone] (Nights/Weekends)  03/13/2023, 10:22 AM

## 2023-03-13 NOTE — Progress Notes (Signed)
   03/13/23 1614  Spiritual Encounters  Type of Visit Follow up  Care provided to: Patient  Referral source Other (comment) (Palliative)  OnCall Visit No  Spiritual Framework  Patient Stress Factors Health changes  Interventions  Spiritual Care Interventions Made Compassionate presence;Reflective listening  Intervention Outcomes  Outcomes Awareness of support;Reduced isolation  Spiritual Care Plan  Spiritual Care Issues Still Outstanding Chaplain will continue to follow  Follow up plan  Chaplain Tiburcio Pea will follow up with patient tomorrow 03/14/2023 in the PM   Chaplain briefly visited with patient. Patient was tired. Patient stated she would have surgery 03/14/2023 in the am. Chaplain continued conversation about the patient informing her spouse of her recent kidney diagnosis. Patient stated she personally informed her spouse. Patient has other family (grand sons) that she will inform in the future. Chaplain Tiburcio Pea will continue to follow. Chaplain Tiburcio Pea will see patient in the afternoon on 02/12/2023.   Arlyce Dice, Chaplain Resident [phone]

## 2023-03-13 NOTE — Progress Notes (Signed)
[facility] Progress Note   Assessment/ Plan:   New ESRD: Baseline creatinine was around 4.5 prior to admission.  AKI likely ATN.  Did require dialysis earlier in the hospitalization.  Had some improvement in kidney function as well as urine output and dialysis catheter was removed.  Now with persistent low urine output and rising creatinine most likely ESRD at this point  -[facility] placement 03/12/23, appreciate assistance -Vascular surgery following, appreciate help.   -CLIP in process - HD 03/14/23 -Continue to monitor daily Cr, Dose meds for GFR -Monitor Daily I/Os, Daily weight  -Maintain MAP>65 for optimal renal perfusion.  -Avoid nephrotoxic medications including NSAIDs -Use synthetic opioids (Fentanyl/Dilaudid) if needed   Anemia: Likely multifactorial with inflammation and CKD contributing.  Increased ESA to 100 mcg weekly.  Iron replete.   HFpEF: Volume status overall acceptable.  Continue to monitor   Osteomyelitis: On daptomycin.  Per primary team   DM2: Management per primary   CKD BMD: phos acceptable. Ca corrects near normal. F/u PTH    Subjective:    Seen in room.  Had [facility] placed yesterday and then had HD in the wee hours of the AM.  Pretty tired today.     Objective:   BP (!) 147/67 (BP Location: Right Arm)   Pulse 72   Temp 98.1 F (36.7 C) (Axillary)   Resp 16   Ht 4\' 11"  (1.499 m)   Wt 83.8 kg   SpO2 97%   BMI 37.31 kg/m   Physical Exam: Gen:NAD CVS: RRR Resp: clear Abd: soft Ext: no  LE edema  Labs: BMET Recent Labs  Lab 03/07/23 0043 03/08/23 0033 03/09/23 0258 03/10/23 0029 03/11/23 0309 03/12/23 0500 03/13/23 0949  NA 135 136 134* 134* 132* 137 136  K 3.4* 4.2 4.2 4.6 4.1 5.0 4.0  CL 103 104 102 102 101 104 102  CO2 24 25 25 23 22 23 25   GLUCOSE 100* 126* 106* 86 105* 110* 79  BUN 24* 27* 39* 44* 49* 50* 28*  CREATININE 3.96* 4.63* 4.89* 5.03* 4.96* 4.54* 3.06*  CALCIUM 7.9* 7.9* 8.0* 7.9* 7.6* 7.8* 7.9*  PHOS 3.0  3.0 4.1 4.5 5.0* 4.6 4.1   CBC Recent Labs  Lab 03/07/23 0043 03/08/23 0033 03/12/23 2255  WBC 4.8 5.7 4.8  HGB 7.5* 7.5* 7.1*  HCT 24.7* 24.9* 24.5*  MCV 91.1 92.2 96.1  PLT 130* 126* 123*      Medications:     acetaminophen  1,000 mg Oral TID   acidophilus  2 capsule Oral TID   calcium carbonate  1 tablet Oral Daily   Chlorhexidine Gluconate Cloth  6 each Topical Q0600   Chlorhexidine Gluconate Cloth  6 each Topical Q0600   cholestyramine  4 g Oral BID   [START ON 03/16/2023] darbepoetin (ARANESP) injection - NON-DIALYSIS  100 mcg Subcutaneous Q Fri-1800   diclofenac  1 patch Transdermal BID   DULoxetine  60 mg Oral Daily   furosemide  40 mg Intravenous Q12H   HYDROmorphone  1 mg Oral TID   lidocaine  2 patch Transdermal Q24H   melatonin  3 mg Oral QHS   methocarbamol  750 mg Oral TID   mometasone-formoterol  2 puff Inhalation BID   nutrition supplement (JUVEN)  1 packet Oral BID BM   pregabalin  75 mg Oral Daily   sodium chloride flush  10-40 mL Intracatheter Q12H   sodium chloride flush  3 mL Intravenous Q12H     Bufford Buttner  MD 03/13/2023, 2:16 PM

## 2023-03-14 ENCOUNTER — Inpatient Hospital Stay (HOSPITAL_COMMUNITY): Payer: PRIVATE HEALTH INSURANCE

## 2023-03-14 ENCOUNTER — Inpatient Hospital Stay (HOSPITAL_COMMUNITY): Payer: PRIVATE HEALTH INSURANCE | Admitting: Anesthesiology

## 2023-03-14 ENCOUNTER — Encounter (HOSPITAL_COMMUNITY): Admission: EM | Disposition: E | Payer: Self-pay | Source: Home / Self Care | Attending: Internal Medicine

## 2023-03-14 ENCOUNTER — Other Ambulatory Visit: Payer: Self-pay

## 2023-03-14 ENCOUNTER — Inpatient Hospital Stay: Payer: PRIVATE HEALTH INSURANCE | Attending: Hematology & Oncology | Admitting: Family

## 2023-03-14 ENCOUNTER — Inpatient Hospital Stay: Payer: PRIVATE HEALTH INSURANCE

## 2023-03-14 ENCOUNTER — Encounter (HOSPITAL_COMMUNITY): Payer: Self-pay | Admitting: Internal Medicine

## 2023-03-14 DIAGNOSIS — N186 End stage renal disease: Secondary | ICD-10-CM

## 2023-03-14 DIAGNOSIS — D631 Anemia in chronic kidney disease: Secondary | ICD-10-CM

## 2023-03-14 DIAGNOSIS — Z992 Dependence on renal dialysis: Secondary | ICD-10-CM

## 2023-03-14 DIAGNOSIS — I25119 Atherosclerotic heart disease of native coronary artery with unspecified angina pectoris: Secondary | ICD-10-CM | POA: Diagnosis not present

## 2023-03-14 DIAGNOSIS — E1122 Type 2 diabetes mellitus with diabetic chronic kidney disease: Secondary | ICD-10-CM | POA: Diagnosis not present

## 2023-03-14 DIAGNOSIS — M4624 Osteomyelitis of vertebra, thoracic region: Secondary | ICD-10-CM | POA: Diagnosis not present

## 2023-03-14 DIAGNOSIS — N185 Chronic kidney disease, stage 5: Secondary | ICD-10-CM | POA: Diagnosis not present

## 2023-03-14 DIAGNOSIS — I120 Hypertensive chronic kidney disease with stage 5 chronic kidney disease or end stage renal disease: Secondary | ICD-10-CM | POA: Diagnosis not present

## 2023-03-14 DIAGNOSIS — Z794 Long term (current) use of insulin: Secondary | ICD-10-CM

## 2023-03-14 HISTORY — PX: AV FISTULA PLACEMENT: SHX1204

## 2023-03-14 LAB — CBC
HCT: 24.7 % — ABNORMAL LOW (ref 36.0–46.0)
Hemoglobin: 7.2 g/dL — ABNORMAL LOW (ref 12.0–15.0)
MCH: 27.4 pg (ref 26.0–34.0)
MCHC: 29.1 g/dL — ABNORMAL LOW (ref 30.0–36.0)
MCV: 93.9 fL (ref 80.0–100.0)
Platelets: 115 10*3/uL — ABNORMAL LOW (ref 150–400)
RBC: 2.63 MIL/uL — ABNORMAL LOW (ref 3.87–5.11)
RDW: 17.2 % — ABNORMAL HIGH (ref 11.5–15.5)
WBC: 4.3 10*3/uL (ref 4.0–10.5)
nRBC: 0 % (ref 0.0–0.2)

## 2023-03-14 LAB — POCT I-STAT, CHEM 8
BUN: 39 mg/dL — ABNORMAL HIGH (ref 8–23)
Calcium, Ion: 1.14 mmol/L — ABNORMAL LOW (ref 1.15–1.40)
Chloride: 103 mmol/L (ref 98–111)
Creatinine, Ser: 4.6 mg/dL — ABNORMAL HIGH (ref 0.44–1.00)
Glucose, Bld: 95 mg/dL (ref 70–99)
HCT: 26 % — ABNORMAL LOW (ref 36.0–46.0)
Hemoglobin: 8.8 g/dL — ABNORMAL LOW (ref 12.0–15.0)
Potassium: 5.1 mmol/L (ref 3.5–5.1)
Sodium: 140 mmol/L (ref 135–145)
TCO2: 25 mmol/L (ref 22–32)

## 2023-03-14 LAB — RENAL FUNCTION PANEL
Albumin: 2.4 g/dL — ABNORMAL LOW (ref 3.5–5.0)
Anion gap: 9 (ref 5–15)
BUN: 40 mg/dL — ABNORMAL HIGH (ref 8–23)
CO2: 25 mmol/L (ref 22–32)
Calcium: 7.6 mg/dL — ABNORMAL LOW (ref 8.9–10.3)
Chloride: 103 mmol/L (ref 98–111)
Creatinine, Ser: 3.84 mg/dL — ABNORMAL HIGH (ref 0.44–1.00)
GFR, Estimated: 12 mL/min — ABNORMAL LOW (ref >60)
Glucose, Bld: 108 mg/dL — ABNORMAL HIGH (ref 70–99)
Phosphorus: 3.9 mg/dL (ref 2.5–4.6)
Potassium: 4.2 mmol/L (ref 3.5–5.1)
Sodium: 137 mmol/L (ref 135–145)

## 2023-03-14 LAB — GLUCOSE, CAPILLARY
Glucose-Capillary: 98 mg/dL (ref 70–99)
Glucose-Capillary: 96 mg/dL (ref 70–99)
Glucose-Capillary: 117 mg/dL — ABNORMAL HIGH (ref 70–99)
Glucose-Capillary: 106 mg/dL — ABNORMAL HIGH (ref 70–99)

## 2023-03-14 SURGERY — ARTERIOVENOUS (AV) FISTULA CREATION
Anesthesia: Regional | Laterality: Left

## 2023-03-14 MED ORDER — FENTANYL CITRATE PF 50 MCG/ML IJ SOSY
50.0000 ug | PREFILLED_SYRINGE | Freq: Once | INTRAMUSCULAR | Status: DC
  Filled 2023-03-14: qty 1

## 2023-03-14 MED ORDER — ORAL CARE MOUTH RINSE: 15.0000 mL | Freq: Once | OROMUCOSAL | Status: AC

## 2023-03-14 MED ORDER — HEPARIN SODIUM (PORCINE) 1000 UNIT/ML IJ SOLN
INTRAMUSCULAR | Status: DC | PRN
  Administered 2023-03-14: 3000 [IU] via INTRAVENOUS

## 2023-03-14 MED ORDER — PROPOFOL 10 MG/ML IV BOLUS
INTRAVENOUS | Status: DC | PRN
  Administered 2023-03-14: 50 ug/kg/min via INTRAVENOUS
  Administered 2023-03-14: 30 mg via INTRAVENOUS

## 2023-03-14 MED ORDER — ONDANSETRON HCL 4 MG/2ML IJ SOLN
4.0000 mg | Freq: Once | INTRAMUSCULAR | Status: AC
  Administered 2023-03-14: 4 mg via INTRAVENOUS

## 2023-03-14 MED ORDER — HEPARIN SODIUM (PORCINE) 1000 UNIT/ML IJ SOLN
INTRAMUSCULAR | Status: AC
  Filled 2023-03-14: qty 10

## 2023-03-14 MED ORDER — HEPARIN 6000 UNIT IRRIGATION SOLUTION
Status: AC
  Filled 2023-03-14: qty 500

## 2023-03-14 MED ORDER — LIDOCAINE 2% (20 MG/ML) 5 ML SYRINGE
INTRAMUSCULAR | Status: DC | PRN
  Administered 2023-03-14: 40 mg via INTRAVENOUS

## 2023-03-14 MED ORDER — CHLORHEXIDINE GLUCONATE 0.12 % MT SOLN
OROMUCOSAL | Status: AC
  Administered 2023-03-14: 15 mL via OROMUCOSAL
  Filled 2023-03-14: qty 15

## 2023-03-14 MED ORDER — ORAL CARE MOUTH RINSE: 15.0000 mL | OROMUCOSAL | Status: DC | PRN

## 2023-03-14 MED ORDER — LIDOCAINE HCL (PF) 1 % IJ SOLN
INTRAMUSCULAR | Status: AC
  Filled 2023-03-14: qty 30

## 2023-03-14 MED ORDER — LIDOCAINE-EPINEPHRINE (PF) 1 %-1:200000 IJ SOLN
INTRAMUSCULAR | Status: AC
  Filled 2023-03-14: qty 30

## 2023-03-14 MED ORDER — ENSURE ENLIVE PO LIQD
237.0000 mL | Freq: Two times a day (BID) | ORAL | Status: DC
  Administered 2023-03-15 – 2023-03-16 (×2): 237 mL via ORAL

## 2023-03-14 MED ORDER — CHLORHEXIDINE GLUCONATE 0.12 % MT SOLN: 15.0000 mL | Freq: Once | OROMUCOSAL | Status: AC

## 2023-03-14 MED ORDER — PHENYLEPHRINE 80 MCG/ML (10ML) SYRINGE FOR IV PUSH (FOR BLOOD PRESSURE SUPPORT)
PREFILLED_SYRINGE | INTRAVENOUS | Status: DC | PRN
  Administered 2023-03-14: 80 ug via INTRAVENOUS

## 2023-03-14 MED ORDER — INSULIN ASPART 100 UNIT/ML IJ SOLN: 0.0000 [IU] | INTRAMUSCULAR | Status: DC | PRN

## 2023-03-14 MED ORDER — 0.9 % SODIUM CHLORIDE (POUR BTL) OPTIME
TOPICAL | Status: DC | PRN
  Administered 2023-03-14: 1000 mL

## 2023-03-14 MED ORDER — HYDRALAZINE HCL 20 MG/ML IJ SOLN
INTRAMUSCULAR | Status: DC | PRN
  Administered 2023-03-14: 10 mg via INTRAVENOUS

## 2023-03-14 MED ORDER — FENTANYL CITRATE (PF) 100 MCG/2ML IJ SOLN
INTRAMUSCULAR | Status: AC
  Administered 2023-03-14: 50 ug
  Filled 2023-03-14: qty 2

## 2023-03-14 MED ORDER — FENTANYL CITRATE (PF) 100 MCG/2ML IJ SOLN
INTRAMUSCULAR | Status: AC
  Filled 2023-03-14: qty 2

## 2023-03-14 MED ORDER — LIDOCAINE-EPINEPHRINE (PF) 1.5 %-1:200000 IJ SOLN
INTRAMUSCULAR | Status: DC | PRN
  Administered 2023-03-14: 30 mL via PERINEURAL

## 2023-03-14 MED ORDER — HYDRALAZINE HCL 20 MG/ML IJ SOLN
INTRAMUSCULAR | Status: AC
  Filled 2023-03-14: qty 1

## 2023-03-14 MED ORDER — FENTANYL CITRATE (PF) 100 MCG/2ML IJ SOLN
25.0000 ug | INTRAMUSCULAR | Status: DC | PRN
  Administered 2023-03-14 (×2): 50 ug via INTRAVENOUS

## 2023-03-14 MED ORDER — LIDOCAINE 2% (20 MG/ML) 5 ML SYRINGE
INTRAMUSCULAR | Status: AC
  Filled 2023-03-14: qty 5

## 2023-03-14 MED ORDER — ONDANSETRON HCL 4 MG/2ML IJ SOLN
INTRAMUSCULAR | Status: AC
  Filled 2023-03-14: qty 2

## 2023-03-14 MED ORDER — HEPARIN 6000 UNIT IRRIGATION SOLUTION
Status: DC | PRN
  Administered 2023-03-14: 1

## 2023-03-14 MED ORDER — PHENYLEPHRINE 80 MCG/ML (10ML) SYRINGE FOR IV PUSH (FOR BLOOD PRESSURE SUPPORT)
PREFILLED_SYRINGE | INTRAVENOUS | Status: AC
Start: 2023-03-14 — End: ?
  Filled 2023-03-14: qty 20

## 2023-03-14 MED ORDER — SODIUM CHLORIDE 0.9 % IV SOLN: INTRAVENOUS | Status: DC

## 2023-03-14 SURGICAL SUPPLY — 39 items
ADH SKN CLS APL DERMABOND .7 (GAUZE/BANDAGES/DRESSINGS) ×1
AGENT HMST SPONGE THK3/8 (HEMOSTASIS)
ARMBAND PINK RESTRICT EXTREMIT (MISCELLANEOUS) ×2 IMPLANT
BAG COUNTER SPONGE SURGICOUNT (BAG) ×1 IMPLANT
BAG SPNG CNTER NS LX DISP (BAG) ×1
BLADE CLIPPER SURG (BLADE) ×1 IMPLANT
CANISTER SUCT 3000ML PPV (MISCELLANEOUS) ×1 IMPLANT
CLIP TI MEDIUM 6 (CLIP) ×1 IMPLANT
CLIP TI WIDE RED SMALL 6 (CLIP) ×1 IMPLANT
COVER PROBE W GEL 5X96 (DRAPES) ×1 IMPLANT
DERMABOND ADVANCED .7 DNX12 (GAUZE/BANDAGES/DRESSINGS) ×1 IMPLANT
DRAPE SURG 17X23 STRL (DRAPES) IMPLANT
ELECT REM PT RETURN 9FT ADLT (ELECTROSURGICAL) ×1
ELECTRODE REM PT RTRN 9FT ADLT (ELECTROSURGICAL) ×1 IMPLANT
GLOVE BIO SURGEON STRL SZ7.5 (GLOVE) ×1 IMPLANT
GLOVE BIOGEL PI IND STRL 8 (GLOVE) ×1 IMPLANT
GOWN STRL REUS W/ TWL LRG LVL3 (GOWN DISPOSABLE) ×2 IMPLANT
GOWN STRL REUS W/ TWL XL LVL3 (GOWN DISPOSABLE) ×2 IMPLANT
GOWN STRL REUS W/TWL LRG LVL3 (GOWN DISPOSABLE) ×2
GOWN STRL REUS W/TWL XL LVL3 (GOWN DISPOSABLE) ×2
HEMOSTAT SPONGE AVITENE ULTRA (HEMOSTASIS) IMPLANT
KIT BASIN OR (CUSTOM PROCEDURE TRAY) ×1 IMPLANT
KIT TURNOVER KIT B (KITS) ×1 IMPLANT
LOOP VASCULAR MINI 18 RED (MISCELLANEOUS) ×1
NS IRRIG 1000ML POUR BTL (IV SOLUTION) ×1 IMPLANT
PACK CV ACCESS (CUSTOM PROCEDURE TRAY) ×1 IMPLANT
PAD ARMBOARD 7.5X6 YLW CONV (MISCELLANEOUS) ×2 IMPLANT
SLING ARM FOAM STRAP LRG (SOFTGOODS) IMPLANT
SLING ARM FOAM STRAP MED (SOFTGOODS) IMPLANT
SPIKE FLUID TRANSFER (MISCELLANEOUS) ×1 IMPLANT
SUT MNCRL AB 4-0 PS2 18 (SUTURE) ×1 IMPLANT
SUT PROLENE 6 0 BV (SUTURE) ×1 IMPLANT
SUT PROLENE 7 0 BV 1 (SUTURE) IMPLANT
SUT VIC AB 3-0 SH 27 (SUTURE) ×1
SUT VIC AB 3-0 SH 27X BRD (SUTURE) ×1 IMPLANT
TOWEL GREEN STERILE (TOWEL DISPOSABLE) ×1 IMPLANT
UNDERPAD 30X36 HEAVY ABSORB (UNDERPADS AND DIAPERS) ×1 IMPLANT
VASCULAR TIE MINI RED 18IN STL (MISCELLANEOUS) IMPLANT
WATER STERILE IRR 1000ML POUR (IV SOLUTION) ×1 IMPLANT

## 2023-03-14 NOTE — Progress Notes (Signed)
Contacted [facility] admissions to request update on pt's referral. Medical clearance is still pending. Contacted pt's RN, PT, and RN CM to inquire if pt is able to sit in the recliner in her room for an extended period of time. Inquiry made due pt needing to be able to tolerate sitting in a chair for 4-5 hrs to simulate sitting for HD and transport to/from HD clinic. Will assist as needed.   Olivia Canter Renal Navigator [phone]

## 2023-03-14 NOTE — Progress Notes (Addendum)
67 year old female that underwent left brachiocephalic AV fistula placement this afternoon.  I completed her operation without incident.  I had already left the OR and dictated my op note.  I was called back to the OR later and informed that the patient had fallen off the OR table.  When I came back in the room she was back on the OR table.  I was not present for the incident.  I have examined her.  Patient is complaining of headache, left shoulder pain, and back pain.  Her hemodynamics are stable.  I will order a stat head CT as well as a CT chest abdomen pelvis.  I have spoken with the hospitalist Dr. Allena Katz to update him.  We discussed getting a potential contrast study but will avoid any IV contrast given she is a new dialysis start and per nephrology avoid any nephrotoxic agents.  I have updated her husband about the incident.  Cephus Shelling, MD [facility] Office: [phone]   Cephus Shelling

## 2023-03-14 NOTE — Progress Notes (Signed)
Patient returned to unit from CT. Patient alert and oriented but tearful and reporting pain 10/10 in her back, head and shoulder. PRN dilaudid administered and emotional support given to patient and her husband. Per MD Allena Katz, patient to remain NPO until all imaging results are back.

## 2023-03-14 NOTE — Anesthesia Postprocedure Evaluation (Signed)
Anesthesia Post Note  Patient: [name] [name]  Procedure(s) Performed: LEFT BRACHIOCEPHALIC ARTERIOVENOUS (AV) FISTULA CREATION (Left)     Patient location during evaluation: PACU Anesthesia Type: Regional Level of consciousness: awake and alert Pain management: pain level controlled Vital Signs Assessment: post-procedure vital signs reviewed and stable Respiratory status: spontaneous breathing, nonlabored ventilation, respiratory function stable and patient connected to nasal cannula oxygen Cardiovascular status: stable and blood pressure returned to baseline Postop Assessment: no apparent nausea or vomiting Anesthetic complications: yes Comments: OR incident - See intra-op record.  CT Head: 1. No acute intracranial process.   CT C/A/P: 1. No acute posttraumatic sequelae in the chest, abdomen or pelvis. 2. Left lower lobe airspace consolidation worrisome for pneumonia. Follow-up imaging recommended in 4-6 weeks to confirm resolution. 3. Stable findings of cirrhosis and portal hypertension. 4. Erosive changes at T4-T5 appear mildly progressed compatible with patient's known osteomyelitis/discitis. Please correlate clinically.   No notable events documented.  Last Vitals:  Vitals:   03/14/23 1715 03/14/23 1757  BP: 132/74 (!) 145/64  Pulse: 92 82  Resp: 14 17  Temp:  36.7 C  SpO2: 97% 99%    Last Pain:  Vitals:   03/14/23 1757  TempSrc: Oral  PainSc: 10-Worst pain ever                 Shelton Silvas

## 2023-03-14 NOTE — Progress Notes (Signed)
PT Cancellation Note  Patient Details Name: [name] [name] MRN: [id number] DOB: May 05, 1956   Cancelled Treatment:    Reason Eval/Treat Not Completed: Other (comment).  Pt declined PT initially then was down for procedure.  Follow up at another time.   Ivar Drape 03/14/2023, 2:20 PM  Samul Dada, PT PhD Acute Rehab Dept. Number: [facility] [id number] and [facility] [phone]

## 2023-03-14 NOTE — Progress Notes (Signed)
[facility] Progress Note Patient: [name] [name] QMV:[id number] DOB: 1956-07-28 DOA: 02/26/2023  DOS: the patient was seen and examined on 03/14/2023  Brief hospital course: PMH of type II DM, neuropathy, thrombocytopenia, OSA, osteomyelitis, hypothyroidism, HTN, CAD, cirrhosis, ESRD now on HD, spinal stenosis SP lumbar fusion, history of renal transplant, PTSD, depression, obesity, fibromyalgia presented to the hospital with complaints of back pain.  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 Assessment and Plan: Osteomyelitis discitis thoracic spine persistent at T4, T5 Presents with back pain. Negative blood culture 4/29 Biopsy results   [-] no growth to date from aspiration Per ID-complete IV daptomycin 04/13/2023, OP F/u 04/12/2023 Continue current pain control.   ATN/AKI on causing ESRD Underwent fistula placement under Dr. Chestine Spore of vascular 5/15 Currently awaiting clip.  Type 2 diabetes mellitus, uncontrolled with hyperglycemia with nephropathy. 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 Hemoglobin A1c less than 5.   Diarrhea-colonized with C. difficile Currently resolved. Will monitor.   Urinary Retention. Foley Catheter Placed on 5/10. Kept On 5/15 as the patient was undergoing procedure. No return to remove postoperatively.   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  Monitor.   Normocytic anemia Iron studies 5/3 did not show any deficiency and saturations are good Management per nephrology.  HFpEF EF 65-70% Volume management with HD.  HTN Monitor for now. Blood pressure soft.   Chronic lower extremity DVTs (unclear when) currently on apixaban 2.5 twice daily    Depression/anxiety continue Cymbalta 60 daily, diazepam 5 every 12 as needed anxiety  Fall in the OR. Patient had a fall in the OR while being transferred  from the OR table to the stretcher. CT chest abdomen pelvis shows no evidence of acute trauma. CT head negative as well. X-ray shoulder of the left arm negative for any acute fracture. Currently has a sling for pain control.  Subjective: No nausea no vomiting no fever no chills.  Unfortunately had a fall after surgery in OR.  Physical Exam: General: in Mild distress, No Rash Cardiovascular: S1 and S2 Present, No Murmur Respiratory: Good respiratory effort, Bilateral Air entry present. No Crackles, No wheezes Abdomen: Bowel Sound present, No tenderness, back pain reported. Extremities: No edema Neuro: Alert and oriented x3, no new focal deficit  Data Reviewed: I have Reviewed nursing notes, Vitals, and Lab results. Since last encounter, pertinent lab results CBC and BMP   . I have ordered test including CBC and BMP  . I have discussed pt's care plan and test results with vascular surgery  .   Disposition: Status is: Inpatient Remains inpatient appropriate because: Need PT OT evaluation  Place and maintain sequential compression device Start: 03/01/23 1536   Family Communication: No one at bedside Level of care: Med-Surg   Vitals:   03/14/23 1645 03/14/23 1700 03/14/23 1715 03/14/23 1757  BP: 138/69 (!) 174/76 132/74 (!) 145/64  Pulse: 83 94 92 82  Resp: 18 (!) 22 14 17   Temp: 97.8 F (36.6 C)   98 F (36.7 C)  TempSrc:    Oral  SpO2: 96% 96% 97% 99%  Weight:      Height:         Author: Lynden Oxford, MD 03/14/2023 7:48 PM  Please look on [email or web address] to find out who is on call.

## 2023-03-14 NOTE — Transfer of Care (Signed)
Immediate Anesthesia Transfer of Care Note  Patient: [name] [name]  Procedure(s) Performed: LEFT BRACHIOCEPHALIC ARTERIOVENOUS (AV) FISTULA CREATION (Left)  Patient Location: PACU  Anesthesia Type:MAC combined with regional for post-op pain  Level of Consciousness: awake, alert , oriented, patient cooperative, and responds to stimulation  Airway & Oxygen Therapy: Patient Spontanous Breathing and Patient connected to face mask oxygen  Post-op Assessment: Report given to RN, Post -op Vital signs reviewed and stable, and Patient moving all extremities X 4  Post vital signs: Reviewed and stable  Last Vitals:  Vitals Value Taken Time  BP 138/69 03/14/23 1645  Temp    Pulse 84 03/14/23 1647  Resp 23 03/14/23 1647  SpO2 93 % 03/14/23 1647  Vitals shown include unvalidated device data.  Last Pain:  Vitals:   03/14/23 1342  TempSrc: Oral  PainSc: 8       Patients Stated Pain Goal: 2 (03/12/23 1246)  Complications: No notable events documented.

## 2023-03-14 NOTE — Anesthesia Preprocedure Evaluation (Addendum)
Anesthesia Evaluation  Patient identified by MRN, date of birth, ID band Patient awake    Reviewed: Allergy & Precautions, NPO status , Patient's Chart, lab work & pertinent test results, reviewed documented beta blocker date and time   Airway Mallampati: III  TM Distance: >3 FB Neck ROM: Full    Dental  (+) Teeth Intact, Dental Advisory Given   Pulmonary asthma , sleep apnea    Pulmonary exam normal breath sounds clear to auscultation       Cardiovascular hypertension, Pt. on home beta blockers + angina  + CAD, + Past MI and + DVT  Normal cardiovascular exam Rhythm:Regular Rate:Normal     Neuro/Psych  PSYCHIATRIC DISORDERS Anxiety Depression     Neuromuscular disease    GI/Hepatic ,GERD  ,,(+) Cirrhosis         Endo/Other  diabetes, Type 2, Insulin DependentHypothyroidism    Renal/GU Dialysis and ESRFRenal disease     Musculoskeletal  (+) Arthritis ,  Fibromyalgia -  Abdominal   Peds  Hematology  (+) Blood dyscrasia (Eliquis; Thrombocytopenia), anemia   Anesthesia Other Findings Day of surgery medications reviewed with the patient.  Reproductive/Obstetrics                             Anesthesia Physical Anesthesia Plan  ASA: 3  Anesthesia Plan: Regional   Post-op Pain Management: Tylenol PO (pre-op)*   Induction: Intravenous  PONV Risk Score and Plan: 2 and TIVA and Treatment may vary due to age or medical condition  Airway Management Planned: Simple Face Mask and Natural Airway  Additional Equipment:   Intra-op Plan:   Post-operative Plan:   Informed Consent: I have reviewed the patients History and Physical, chart, labs and discussed the procedure including the risks, benefits and alternatives for the proposed anesthesia with the patient or authorized representative who has indicated his/her understanding and acceptance.     Dental advisory given  Plan Discussed  with: CRNA  Anesthesia Plan Comments:        Anesthesia Quick Evaluation

## 2023-03-14 NOTE — Progress Notes (Signed)
Nutrition Follow-up  DOCUMENTATION CODES:   Obesity unspecified  INTERVENTION:  Once diet resumes, recommend: Regular diet for wider variety of menu options Ensure Enlive po BID, each supplement provides 350 kcal and 20 grams of protein. Discontinue Juven "Nutrition for Dialysis" handout provided and left on bedside table for review  NUTRITION DIAGNOSIS:   Increased nutrient needs related to acute illness, catabolic illness (acute OM of thoracic spine, new ESRD requiring HD) as evidenced by estimated needs. - updated 5/15; increased needs ongoing  GOAL:   Patient will meet greater than or equal to 90% of their needs - progressing  MONITOR:   Diet advancement, Supplement acceptance, Labs, Weight trends, I & O's  REASON FOR ASSESSMENT:   Consult Diet education (ESRD)  ASSESSMENT:   67 y.o. female admits related to abdominal pain, back pain, nausea, and vomiting. PMH includes: acute MI, anemia, CAD, cirrhosis, DM, HTN, HLD, MRSA. Pt is currently receiving medical management related to acute osteomyelitis of thoracic spine.  5/3 s/p disc aspiration 5/13 new ESRD s/p [facility]  5/15 fistula placement  Next HD planned for today.  PMT following for GOC.    Attempted to speak with pt at bedside regarding nutrition intake and new ESRD. Pt with arms over head, awakes to name but is minimally conversant and reports feeling tired today.   Meal completions: 5/7: 100% breakfast and dinner 5/9: 50% breakfast, 100% lunch 5/14: 100% lunch  Unfortunately, there is limited documentation of meal completions to review. Unable to determine how well pt is meeting her nutrition needs based on limited information. Pt has remained on a renal/carb modified diet throughout admission. D/t presence of OM of thoracic spine and initiation of dialysis, pt's nutrition needs are elevated and she would benefit from the least restrictive diet to meet these needs.   Pt had previously been receiving Glucerna  shakes however these were d/c as pt was experiencing diarrhea but source of this is unclear, also of note pt has been on abx throughout admission. Pt had been started on questran, now appears stools are less frequent and more type 6 versus 7. Will continue to monitor.   Medications: acidophilus, TUMS daily, questran, IV lasix 40mg  q12h, melatonin, IV abx  Labs: BUN 39, Cr 4.60, ionized Ca 1.14, GFR 12, CBG's 97-175 x24 hours  UOP: 3.8L x24 hours +1.2L x12 hours I/O's: -10.8L since 5/1  NUTRITION - FOCUSED PHYSICAL EXAM: Unable to assess, pt with arms over head and fatigued.   Diet Order:   Diet Order             Diet NPO time specified Except for: Sips with Meds  Diet effective midnight                   EDUCATION NEEDS:   Not appropriate for education at this time  Skin:  Skin Assessment: Skin Integrity Issues: Skin Integrity Issues:: Other (Comment) Other: pressure injury to L heel  Last BM:  5/14  Height:   Ht Readings from Last 1 Encounters:  02/26/23 4\' 11"  (1.499 m)    Weight:   Wt Readings from Last 1 Encounters:  03/14/23 84 kg    Ideal Body Weight:  44.7 kg  BMI:  Body mass index is 37.4 kg/m.  Estimated Nutritional Needs:   Kcal:  1500-1700  Protein:  75-90g  Fluid:  >/=1.5L  Drusilla Kanner, RDN, LDN Clinical Nutrition

## 2023-03-14 NOTE — Progress Notes (Signed)
PT to HD via transport.

## 2023-03-14 NOTE — Progress Notes (Signed)
Attempted voiding trial with no success, foley remains. MD Allena Katz made aware.

## 2023-03-14 NOTE — Anesthesia Procedure Notes (Signed)
Anesthesia Regional Block: Adductor canal block   Pre-Anesthetic Checklist: , timeout performed,  Correct Patient, Correct Site, Correct Laterality,  Correct Procedure, Correct Position, site marked,  Risks and benefits discussed,  Surgical consent,  Pre-op evaluation,  At surgeon's request and post-op pain management  Laterality: Left  Prep: chloraprep       Needles:  Injection technique: Single-shot  Needle Type: Echogenic Needle     Needle Length: 9cm  Needle Gauge: 21     Additional Needles:   Procedures:,,,, ultrasound used (permanent image in chart),,    Narrative:  Start time: 03/14/2023 2:38 PM End time: 03/14/2023 2:46 PM Injection made incrementally with aspirations every 5 mL.  Performed by: Personally  Anesthesiologist: Collene Schlichter, MD  Additional Notes: No pain on injection. No increased resistance to injection. Injection made in 5cc increments.  Good needle visualization.  Patient tolerated procedure well.

## 2023-03-14 NOTE — Progress Notes (Signed)
[facility] Progress Note   Assessment/ Plan:   New ESRD: Baseline creatinine was around 4.5 prior to admission.  AKI likely ATN.  Did require dialysis earlier in the hospitalization.  Had some improvement in kidney function as well as urine output and dialysis catheter was removed.  Now with persistent low urine output and rising creatinine most likely ESRD at this point  -[facility] placement 03/12/23, appreciate assistance -Vascular surgery following, appreciate help.   -CLIP in process - HD 03/14/23 after AVF - AVF today 03/14/23 -Continue to monitor daily Cr, Dose meds for GFR -Monitor Daily I/Os, Daily weight  -Maintain MAP>65 for optimal renal perfusion.  -Avoid nephrotoxic medications including NSAIDs -Use synthetic opioids (Fentanyl/Dilaudid) if needed   Anemia: Likely multifactorial with inflammation and CKD contributing.  Increased ESA to 100 mcg weekly.  Iron replete.   HFpEF: Volume status overall acceptable.  Continue to monitor   Osteomyelitis: On daptomycin.  Per primary team   DM2: Management per primary   CKD BMD: phos acceptable. Ca corrects near normal. F/u PTH    Subjective:    For AVF today and HD thereafter.  Mult attempts to see pt today- unavailable.  See exam from yesterday   Objective:   BP (!) 217/88 (BP Location: Right Arm)   Pulse 96   Temp 98.4 F (36.9 C) (Oral)   Resp 19   Ht 4\' 11"  (1.499 m)   Wt 84 kg   SpO2 98%   BMI 37.40 kg/m   Physical Exam: Gen:NAD CVS: RRR Resp: clear Abd: soft Ext: no  LE edema  Labs: BMET Recent Labs  Lab 03/08/23 0033 03/09/23 0258 03/10/23 0029 03/11/23 0309 03/12/23 0500 03/13/23 0949 03/14/23 0355 03/14/23 1341  NA 136 134* 134* 132* 137 136 137 140  K 4.2 4.2 4.6 4.1 5.0 4.0 4.2 5.1  CL 104 102 102 101 104 102 103 103  CO2 25 25 23 22 23 25 25   --   GLUCOSE 126* 106* 86 105* 110* 79 108* 95  BUN 27* 39* 44* 49* 50* 28* 40* 39*  CREATININE 4.63* 4.89* 5.03* 4.96* 4.54* 3.06* 3.84*  4.60*  CALCIUM 7.9* 8.0* 7.9* 7.6* 7.8* 7.9* 7.6*  --   PHOS 3.0 4.1 4.5 5.0* 4.6 4.1 3.9  --    CBC Recent Labs  Lab 03/08/23 0033 03/12/23 2255 03/14/23 1341  WBC 5.7 4.8  --   HGB 7.5* 7.1* 8.8*  HCT 24.9* 24.5* 26.0*  MCV 92.2 96.1  --   PLT 126* 123*  --       Medications:     [MAR Hold] sodium chloride   Intravenous Once   [MAR Hold] acetaminophen  1,000 mg Oral TID   [MAR Hold] acidophilus  2 capsule Oral TID   [MAR Hold] calcium carbonate  1 tablet Oral Daily   [MAR Hold] Chlorhexidine Gluconate Cloth  6 each Topical Q0600   [MAR Hold] cholestyramine  4 g Oral BID   [MAR Hold] darbepoetin (ARANESP) injection - NON-DIALYSIS  100 mcg Subcutaneous Q Fri-1800   [MAR Hold] diclofenac  1 patch Transdermal BID   [MAR Hold] DULoxetine  60 mg Oral Daily   fentaNYL (SUBLIMAZE) injection  50 mcg Intravenous Once   [MAR Hold] furosemide  40 mg Intravenous Q12H   [MAR Hold] HYDROmorphone  1 mg Oral TID   [MAR Hold] lidocaine  2 patch Transdermal Q24H   [MAR Hold] melatonin  3 mg Oral QHS   [MAR Hold] methocarbamol  750  mg Oral TID   [MAR Hold] mometasone-formoterol  2 puff Inhalation BID   [MAR Hold] nutrition supplement (JUVEN)  1 packet Oral BID BM   [MAR Hold] pregabalin  75 mg Oral Daily   [MAR Hold] sodium chloride flush  10-40 mL Intracatheter Q12H   [MAR Hold] sodium chloride flush  3 mL Intravenous Q12H     Bufford Buttner MD 03/14/2023, 3:46 PM

## 2023-03-14 NOTE — Op Note (Signed)
OPERATIVE NOTE  DATE: Mar 14, 2023  PROCEDURE: left brachiocephalic arteriovenous fistula placement  PRE-OPERATIVE DIAGNOSIS: end stage renal disease  POST-OPERATIVE DIAGNOSIS: same as above   SURGEON: Cephus Shelling, MD  ASSISTANT(S): Doreatha Massed, PA  ANESTHESIA: regional  ESTIMATED BLOOD LOSS: Minimal  FINDING(S): 1.  Cephalic vein: 4 mm, acceptable 2.  Brachial artery: 3.5 mm, atherosclerotic disease evident 3.  Venous outflow: palpable thrill  4.  Radial flow: palpable radial pulse  SPECIMEN(S):  none  INDICATIONS:   [name] [name] is a 67 y.o. adult who presents with end stage renal disease.  The patient is scheduled for left arm arteriovenous fistula placement.  The patient is aware the risks include but are not limited to: bleeding, infection, steal syndrome, nerve damage, ischemic monomelic neuropathy, failure to mature, and need for additional procedures.  The patient is aware of the risks of the procedure and elects to proceed forward.   DESCRIPTION: After full informed written consent was obtained from the patient, the patient was brought back to the operating room and placed supine upon the operating table.  Prior to induction, the patient received IV antibiotics.   After obtaining adequate anesthesia, the patient was then prepped and draped in the standard fashion for a left arm access procedure.  I turned my attention first to identifying the patient's cephalic vein and this was adequate caliber at the elbow.  I marked out the brachial artery and cephalic vein     I made a transverse incision at the level of the antecubitum and dissected through the subcutaneous tissue and fascia to gain exposure of the brachial artery.  This was noted to be 3.5 mm in diameter externally.  This was dissected out proximally and distally and controlled with vessel loops .  I then dissected out the cephalic vein.  This was noted to be 4 mm in diameter externally.  The  distal segment of the vein was ligated with a  2-0 silk, and the vein was transected.  The proximal segment was interrogated with serial dilators.  The vein accepted up to a 5 mm dilator without any difficulty.  I then instilled the heparinized saline into the vein and clamped it.  At this point, I reset my exposure of the brachial artery.  The patient was given 3,000 units IV heparin.  I then placed the artery under tension proximally and distally.  I made an arteriotomy with a #11 blade, and then I extended the arteriotomy with a Potts scissor.  I injected heparinized saline proximal and distal to this arteriotomy.  The vein was then sewn to the artery in an end-to-side configuration with a running stitch of 6-0 Prolene.  Prior to completing this anastomosis, I allowed the vein and artery to backbleed.  There was no evidence of clot from any vessels.  I completed the anastomosis in the usual fashion and then released all vessel loops and clamps.    There was a palpable thrill in the venous outflow, and there was a palpable radial pulse.  At this point, I irrigated out the surgical wound.  There was no further active bleeding.  The subcutaneous tissue was reapproximated with a running stitch of 3-0 Vicryl.  The skin was then reapproximated with a running subcuticular stitch of 4-0 Monocryl.  The skin was then cleaned, dried, and reinforced with Dermabond.  The patient tolerated this procedure well.   COMPLICATIONS: None  CONDITION: Stable   Cephus Shelling, MD [facility] Office: [phone]  Cephus Shelling   03/14/2023, 4:25 PM

## 2023-03-14 NOTE — OR Nursing (Signed)
Prior to the patient being transferred to the patient's stretcher, the stretcher was lined up but there was still a gap, the seatbelt was off the patient, and the patient slide onto the floor between the OR table and the stretcher. The patient was assessed at time of fall and at time of being return to OR table. Dr. Chestine Spore was notified and Oren Binet. Dr. Chestine Spore returned to room and assessed the patient as well as made the family aware.

## 2023-03-14 NOTE — Discharge Instructions (Addendum)
[facility]  Discharge Instructions  AV Fistula or Graft Surgery for Dialysis Access  Please refer to the following instructions for your post-procedure care. Your surgeon or physician assistant will discuss any changes with you.  Activity  You may drive the day following your surgery, if you are comfortable and no longer taking prescription pain medication. Resume full activity as the soreness in your incision resolves.  Bathing/Showering  You may shower after you go home. Keep your incision dry for 48 hours. Do not soak in a bathtub, hot tub, or swim until the incision heals completely. You may not shower if you have a hemodialysis catheter.  Incision Care  Clean your incision with mild soap and water after 48 hours. Pat the area dry with a clean towel. You do not need a bandage unless otherwise instructed. Do not apply any ointments or creams to your incision. You may have skin glue on your incision. Do not peel it off. It will come off on its own in about one week. Your arm may swell a bit after surgery. To reduce swelling use pillows to elevate your arm so it is above your heart. Your doctor will tell you if you need to lightly wrap your arm with an ACE bandage.  Diet  Resume your normal diet. There are not special food restrictions following this procedure. In order to heal from your surgery, it is CRITICAL to get adequate nutrition. Your body requires vitamins, minerals, and protein. Vegetables are the best source of vitamins and minerals. Vegetables also provide the perfect balance of protein. Processed food has little nutritional value, so try to avoid this.  Medications  Resume taking all of your medications. If your incision is causing pain, you may take over-the counter pain relievers such as acetaminophen (Tylenol). If you were prescribed a stronger pain medication, please be aware these medications can cause nausea and constipation. Prevent  nausea by taking the medication with a snack or meal. Avoid constipation by drinking plenty of fluids and eating foods with high amount of fiber, such as fruits, vegetables, and grains.  Do not take Tylenol if you are taking prescription pain medications.  Follow up Your surgeon may want to see you in the office following your access surgery. If so, this will be arranged at the time of your surgery.  Please call us immediately for any of the following conditions:  Increased pain, redness, drainage (pus) from your incision site Fever of 101 degrees or higher Severe or worsening pain at your incision site Hand pain or numbness.  Reduce your risk of vascular disease:  Stop smoking. If you would like help, call [facility] at 1-800-QUIT-NOW (272-027-6158) or [place] at (276) 878-7182  Manage your cholesterol Maintain a desired weight Control your diabetes Keep your blood pressure down  Dialysis  It will take several weeks to several months for your new dialysis access to be ready for use. Your surgeon will determine when it is okay to use it. Your nephrologist will continue to direct your dialysis. You can continue to use your Permcath until your new access is ready for use.   03/14/2023 Patriece Danese [id number] 08/19/1956  Surgeon(s): Cephus Shelling, MD  Procedure(s): LEFT BRACHIOCEPHALIC ARTERIOVENOUS (AV) FISTULA CREATION  x Do not stick fistula for 12 weeks    If you have any questions, please call the office at [phone].     Nutrition for Dialysis   You can enjoy many foods when you  have chronic kidney disease.  Limiting a few others can also help you be healthy. Follow these tips and those given by your registered dietitian nutritionist (RDN).   Protein  People on dialysis need a lot of protein. Choose 2 to 3 palm-sized portions of protein foods each day. These include fresh lean beef, lamb, veal, wild game, and "natural" chicken, fish, pork,  seafood, and [place], or 2 to 3 eggs or egg whites. Beans, edamame, lentils, nut butters, and tofu may be good options in meatless meals. Limit salty processed meats such as bacon, brats, deli meats, ham, hot dogs, and sausage.   Dairy Products  Limit milk, ice cream, pudding, and yogurt to 4 to 8 oz ( to 1 cup) per day or 1 slice of natural cheese (such as cheddar, mozzarella, or Swiss). Or, instead of cow's milk, use unfortified rice, almond, or soy milk. Limit processed cheeses, such as American cheese, Cheez Whiz, [place], boxed macaroni and cheese, and other cheese spreads or sauces with "phos" ingredients.   Breads, Grains, and Starches  Choose whole grain breads, grains, and lower salt snacks. Limit processed foods such as boxed mixes, biscuits, muffins, pancakes, waffles, instant hot cereals, and ready-to-eat baked goods with "phos" ingredients.   Fluids  If you make little urine, you may need to limit how much you drink. Stay within the amount recommended by your dialysis team. This includes all beverages and ice, as well as the fluid in foods such as gelatin, ice cream, popsicles, and soup. Look for beverages without "phos" ingredients.  Fruits - Enjoy 2 to 3 servings per day (a serving is  cup or 1 small fruit):  Lower Potassium  Apples     Applesauce  Berries     Canned fruit or fruit cups  Clementine     Grapes  [place]     Lemon and lime  Mandarin orange    Pear  Pineapple     Plum  Tangerine     Watermelon   Higher Potassium  Avocado     Banana  Dried fruit     Guava  Jackfruit     Kiwi  Mango     Melons: cantaloupe or honeydew  Nectarine     Orange  Papaya     Peach  Persimmon     Plantain  Pomegranate     Pomelo   Vegetables  - Enjoy 2 to 3 servings per day (a serving is 1 cup leafy greens or  cup fresh, cooked, or canned):  Lower Potassium  Asparagus     Bitter melon  Broccoli     Cabbage  Carrots     Cauliflower  Celery      Corn  Cucumber     Eggplant   Green beans     Greens: collard, mustard, or turnip  Kale      Lettuce  Okra      Onion  Peppers     Radish  Spaghetti squash    Sweet or snap peas  Turnip   Higher Potassium  Artichoke     Brussels sprouts  Cassava root     Celery root (celeriac)  Cooked chard     Kohlrabi  Parsnips     Potato  Pumpkin     [place]  Squash: acorn, butternut, hubbard, most winter varieties  Sweet potatoes or yams (batata)  Taro root  Tomatoes or tomato sauce   Zucchini   Salt and Sodium Choose foods with less than  200 mg sodium per serving. Packaged meals should have less than 600 mg per serving. Do not add salt to foods. Instead, use herbs and spices such as garlic, onion or garlic powder, basil, oregano, paprika, pepper, or thyme. You can also add flavor with lemon, lime, vinegars, or a salt-free seasoning blend like Mrs. Dash.   Added Phosphorus  Limit foods and drinks with added phosphorus (any ingredients with "phos," such as calcium phosphate or phosphoric acid). Fast food, convenience or gas station foods, restaurant meals, and other processed, boxed, or prepared foods are often high in added phosphorus.   If You Have Diabetes  Follow your diabetes meal and snack plan with the above diet changes. Do not use orange juice to treat low blood sugars. It is high in potassium. Instead, choose:  glucose tabs or gel   cup regular cranberry, grape, or apple juice   cup 7-Up or Sprite

## 2023-03-14 NOTE — Progress Notes (Signed)
Orthopedic Tech Progress Note Patient Details:  [name] [name] 1956/06/18 [id number]  Ortho Devices Type of Ortho Device: Arm sling Ortho Device/Splint Location: LUE Ortho Device/Splint Interventions: Application   Post Interventions Patient Tolerated: Well  Genelle Bal [name] 03/14/2023, 6:08 PM

## 2023-03-14 NOTE — Progress Notes (Signed)
   03/14/23 2335  Pain Assessment  Pain Scale 0-10  Pain Score 8  Pain Type Chronic pain  Pain Location Back  Pain Orientation Upper  Pain Intervention(s) Medication (See eMAR)  Hemodialysis Catheter Left Subclavian Double lumen Permanent (Tunneled)  Placement Date/Time: 03/12/23 1402   Placed prior to admission: No  Serial / Lot #: LOT [id number]  Expiration Date: 08/26/24  Time Out: Correct patient;Correct site;Correct procedure  Maximum sterile barrier precautions: Hand hygiene;Cap;Mask;Sterile...  Site Condition No complications  Blue Lumen Status Flushed;Dead end cap in place;Heparin locked  Red Lumen Status Flushed;Dead end cap in place;Heparin locked  Catheter fill solution Heparin 1000 units/ml  Catheter fill volume (Arterial) 2.1 cc  Catheter fill volume (Venous) 2.1  Dressing Type Transparent  Dressing Status Antimicrobial disc in place;Clean, Dry, Intact  Interventions Other (Comment) (assessed)  Drainage Description None  Dressing Change Due 03/19/23  Post treatment catheter status Capped and Clamped  Fistula / Graft Left Upper arm Arteriovenous fistula  Placement Date/Time: 03/14/23 1609   Orientation: Left  Access Location: Upper arm  Access Type: (c) Arteriovenous fistula  Fistula / Graft Assessment Present;Thrill;Bruit  Status Other (Comment) (Not in use created today)  Neurological  Level of Consciousness Alert  Orientation Level Oriented X4  RUE Motor Strength 4  LUE Motor Strength 4  RLE Motor Strength 4  LLE Motor Strength 4  Respiratory  Respiratory Pattern Regular;Unlabored  Chest Assessment Chest expansion symmetrical  Bilateral Breath Sounds Clear;Diminished  Cardiac  Pulse Regular  Heart Sounds S1, S2  Jugular Venous Distention (JVD) No  ECG Monitor No (leads removed for transport back to room)  Cardiac Rhythm NSR  Vascular  R Radial Pulse +2  L Radial Pulse +2  R Dorsalis Pedis Pulse +2  L Dorsalis Pedis Pulse +1  Edema Generalized   Generalized Edema Other (Comment) (non-pitting)  GU Assessment  Genitourinary (WDL) X  Genitourinary Symptoms Existing urinary catheter  Urethral Catheter Amanda RN Non-latex 16 Fr.  Placement Date/Time: 03/09/23 1104   Inserted prior to hospital arrival?: No  Perineal care performed prior to insertion?: Yes  Person Inserting LDA: Marchelle Folks RN  Person Assisting with Catheter Insertion: Didier NT  Patient Location at Time of Insertion...  Collection Container Standard drainage bag  Output (mL) 200 mL  Psychosocial  Psychosocial (WDL) WDL  Patient Behaviors Appropriate for age;Calm;Cooperative   Received patient in bed to unit. - yes Alert and oriented. - yes Informed consent signed and in chart. - yes  TX duration:3.0 hrs  Patient tolerated well. - yes, no complication of dialysis noted Transported back to the room - yes 6N26 Alert, without acute distress. - yes Hand-off given to patient's nurse. Elease Hashimoto Pool  Access used: Lt chest perm cath Access issues: Lines reversed  Total UF removed: Nil Medication(s) given: Tylenol 1000mg  Post HD VS: B/P 144/68; HR 89; RR 19; Post HD weight: 72.2 kg   Arianni Gallego [facility]

## 2023-03-14 NOTE — Progress Notes (Signed)
BP elevated in short stay. MD notified. No new orders at this time. Will continue to monitor.

## 2023-03-14 NOTE — Progress Notes (Signed)
[facility] [place]  Subjective  - no complaints   Objective (!) 162/77 76 98.4 F (36.9 C) (Oral) 18 94%  Intake/Output Summary (Last 24 hours) at 03/14/2023 1432 Last data filed at 03/14/2023 1152 Gross per 24 hour  Intake 63 ml  Output 2700 ml  Net -2637 ml    Left radial and brachial pulse palpable  Laboratory Lab Results: Recent Labs    03/12/23 2255 03/14/23 1341  WBC 4.8  --   HGB 7.1* 8.8*  HCT 24.5* 26.0*  PLT 123*  --    BMET Recent Labs    03/13/23 0949 03/14/23 0355 03/14/23 1341  NA 136 137 140  K 4.0 4.2 5.1  CL 102 103 103  CO2 25 25  --   GLUCOSE 79 108* 95  BUN 28* 40* 39*  CREATININE 3.06* 3.84* 4.60*  CALCIUM 7.9* 7.6*  --     COAG Lab Results  Component Value Date   INR 1.3 (H) 02/26/2023   INR 1.2 01/30/2023   INR 1.2 06/23/2022   No results found for: "PTT"  Assessment/Planning:  67 year old female with end-stage renal disease that vascular was consulted for permanent hemodialysis access.  On Monday she underwent placement of left IJ TDC.  We delayed her fistula as she had gotten her Eliquis on Sunday evening.  She has decided to move forward with dialysis access.  Plan is left arm AV fistula today.  Risk benefits discussed.  Has a usable left cephalic vein on vein mapping.  Cephus Shelling 03/14/2023 2:32 PM --

## 2023-03-14 NOTE — Hospital Course (Addendum)
PMH of type II DM, neuropathy, thrombocytopenia, OSA, osteomyelitis, hypothyroidism, HTN, CAD, cirrhosis, ESRD now on HD, spinal stenosis SP lumbar fusion, history of renal transplant, PTSD, depression, obesity, fibromyalgia, low-dose metoprolol doses.  BMI baseline.  Presented to the hospital with complaints of back pain.  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  4/29 admitted to the hospital.  MRI controllable Looking for vertebral body, new marrow edema T3.  7 mm epidural fluid collection. 5/2 nephrology was consulted for AKI on CKD 5. 5/3 underwent IR guided T5 bone biopsy and T4-T5 disc aspiration.  IR also placed temp HD catheter for HD. 5/12 vascular surgery was consulted. 5/13 left TDC placement with vascular surgery. 5/14 palliative care consulted for pain management 5/15 underwent aVF placement left brachial cephalic. Had a Fall in the OR while being transferred from the OR table. 5/17 Accepted at [facility] SW [facility] TTS pending pt's ability to sit for extended period of time. 5/18 1 PRBC transfusion ordered

## 2023-03-14 NOTE — Progress Notes (Signed)
Daily Progress Note   Patient Name: [name] [name]       Date: 03/14/2023 DOB: [birth year]  Age: 67 y.o. MRN#: [id number] Attending Physician: Rolly Salter, MD Primary Care Physician: Zola Button, Grayling Congress, DO Admit Date: 02/26/2023  Reason for Consultation/Follow-up: Establishing goals of care and Pain control  Subjective: Patient was sleeping. Attempted to awake her for conversation. After a few minutes she fell back asleep. Told her I would check back later.  Length of Stay: 15  Current Medications: Scheduled Meds:   [MAR Hold] sodium chloride   Intravenous Once   [MAR Hold] acetaminophen  1,000 mg Oral TID   [MAR Hold] acidophilus  2 capsule Oral TID   [MAR Hold] calcium carbonate  1 tablet Oral Daily   [MAR Hold] Chlorhexidine Gluconate Cloth  6 each Topical Q0600   [MAR Hold] cholestyramine  4 g Oral BID   [MAR Hold] darbepoetin (ARANESP) injection - NON-DIALYSIS  100 mcg Subcutaneous Q Fri-1800   [MAR Hold] diclofenac  1 patch Transdermal BID   [MAR Hold] DULoxetine  60 mg Oral Daily   [MAR Hold] furosemide  40 mg Intravenous Q12H   [MAR Hold] HYDROmorphone  1 mg Oral TID   [MAR Hold] lidocaine  2 patch Transdermal Q24H   [MAR Hold] melatonin  3 mg Oral QHS   [MAR Hold] methocarbamol  750 mg Oral TID   [MAR Hold] mometasone-formoterol  2 puff Inhalation BID   [MAR Hold] nutrition supplement (JUVEN)  1 packet Oral BID BM   [MAR Hold] pregabalin  75 mg Oral Daily   [MAR Hold] sodium chloride flush  10-40 mL Intracatheter Q12H   [MAR Hold] sodium chloride flush  3 mL Intravenous Q12H    Continuous Infusions:  sodium chloride 10 mL/hr at 03/14/23 1350   [MAR Hold] DAPTOmycin (CUBICIN) 650 mg in sodium chloride 0.9 % IVPB Stopped (03/13/23 1732)   [MAR Hold] vancomycin  1,000 mg (03/14/23 1352)    PRN Meds: [MAR Hold] albuterol, [MAR Hold] alteplase, [MAR Hold] diazepam, [MAR Hold] heparin, [MAR Hold] hydrALAZINE, [MAR Hold] HYDROmorphone, insulin aspart, [MAR Hold] lidocaine (PF), [MAR Hold] lidocaine-prilocaine, [MAR Hold] naLOXone (NARCAN)  injection, [MAR Hold] ondansetron (ZOFRAN) IV, [MAR Hold] mouth rinse, [MAR Hold] pentafluoroprop-tetrafluoroeth  Physical Exam Constitutional:      Comments: sleepy  Pulmonary:  Effort: Pulmonary effort is normal.  Skin:    General: Skin is warm and dry.             Vital Signs: BP (!) 162/77 (BP Location: Left Arm)   Pulse 76   Temp 98.4 F (36.9 C) (Oral)   Resp 18   Ht 4\' 11"  (1.499 m)   Wt 84 kg   SpO2 94%   BMI 37.40 kg/m  SpO2: SpO2: 94 % O2 Device: O2 Device: Room Air O2 Flow Rate: O2 Flow Rate (L/min): 2 L/min  Intake/output summary:  Intake/Output Summary (Last 24 hours) at 03/14/2023 1430 Last data filed at 03/14/2023 1152 Gross per 24 hour  Intake 63 ml  Output 2700 ml  Net -2637 ml   LBM: Last BM Date : 03/13/23 Baseline Weight: Weight: 82.1 kg Most recent weight: Weight: 84 kg       Palliative Assessment/Data:      Patient Active Problem List   Diagnosis Date Noted   Acute osteomyelitis of thoracic spine (HCC) 02/27/2023   Vertebral osteomyelitis (HCC) 01/28/2023   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   Osteomyelitis (HCC) 06/20/2022   Estrogen deficiency 05/25/2022   CKD (chronic kidney disease) stage 5, GFR less than 15 ml/min (HCC) 02/14/2022   Prolonged QT interval 01/07/2022   Streptococcal bacteremia    Osteomyelitis of left foot (HCC) 10/16/2021   Diabetic foot ulcers (HCC) 10/16/2021   Portal hypertension (HCC) 08/16/2021   Eczema of scalp 06/27/2021   Hypothyroidism 06/27/2021   Pruritus 06/27/2021   Seasonal allergic rhinitis due to pollen 02/02/2021   PTSD (post-traumatic stress disorder)    Osteoarthritis    MRSA (methicillin resistant Staphylococcus  aureus)    Hypertension    High cholesterol    Hepatic steatosis    GERD (gastroesophageal reflux disease)    Fibromyalgia    Episode of visual loss of left eye    Depression    Constipation    Cataract    Bulging lumbar disc    Asthma    Anxiety    Anemia    Tinea capitis 10/11/2020   Tinea corporis 10/11/2020   Hip pain 05/16/2020   IDA (iron deficiency anemia) 05/04/2020   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) 03/16/2020   S/P lumbar fusion 03/04/2020   Spinal stenosis of lumbar region 01/22/2020   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) 01/21/2020   Compression fracture of L1 lumbar vertebra (HCC) 11/17/2019   Fracture of multiple ribs 11/17/2019   Nausea & vomiting 11/15/2019   Diarrhea 09/04/2019   Pressure injury of skin 04/17/2019   Septic arthritis of elbow, right (HCC) 04/17/2019   Retinopathy 04/17/2019   Renal transplant, 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   Sleep apnea 11/16/2017   Diabetic foot infection (HCC) 04/06/2017   Type 2 diabetes mellitus with hyperglycemia, with long-term current use of insulin (HCC) 04/06/2017   Neuropathy 05/05/2015   Diabetic peripheral neuropathy (HCC) 01/12/2015   CKD (chronic kidney disease), stage III (HCC) 05/27/2014   History of MI (myocardial infarction) 10/06/2013   Chest pain 10/06/2013   Nausea and vomiting 10/06/2013   Obesity (BMI 30-39.9) 09/20/2013   Multinodular goiter 04/17/2013   Normocytic anemia 02/03/2013   CAD (coronary artery disease) 01/11/2012   UTI (urinary tract infection) 08/05/2010   Hepatic cirrhosis (HCC) 07/06/2010   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   Hyperlipidemia 06/03/2008   Essential hypertension 12/23/2006   Acute MI [facility]) 2007    Palliative Care Assessment & Plan  Patient Profile: [name] [name] is a 67 y.o. adult with medical history significant of CKD 5,diabetes, neuropathy, thrombocytopenia, OSA, osteomyelitis, diabetic foot infections, anemia,  hypothyroidism, hypertension, hyperlipidemia, CAD, cirrhosis, asthma, spinal stenosis, status post lumbar fusion, status post renal transplant (age 89), PTSD, depression, anxiety, obesity, fibromyalgia, DVT, cataracts presenting with worsening back pain, nausea, vomiting. She presented to the hospital for back pain, abdominal pain, nausea, and vomiting and was admitted on 02/26/23.   Assessment: Patient said she slept well overnight. She states her pain is "okay." I asked her if her and her husband had discussed code status after our conversation yesterday and she said yes. She was very sleepy though and asked I return later.  Returned at 1430 and she was in a procedure.  Recommendations/Plan: Full Code/Full Scope Continue conversations with patient and husband  Continued PMT support  Goals of Care and Additional Recommendations: Limitations on Scope of Treatment: Full Scope Treatment  Code Status:    Code Status Orders  (From admission, onward)           Start     Ordered   02/26/23 1448  Full code  Continuous       Question:  By:  Answer:  Consent: discussion documented in EHR   02/26/23 1449           Code Status History     Date Active Date Inactive Code Status Order ID Comments User Context   01/28/2023 2336 02/04/2023 1730 Full Code [id number]  John Giovanni, MD ED   06/21/2022 0025 07/11/2022 2159 Full Code [id number]  Carollee Herter, DO ED   01/07/2022 0511 01/09/2022 2045 Full Code [id number]  Howerter, Chaney Born, DO Inpatient   10/17/2021 0420 10/22/2021 1928 Full Code [id number]  Chotiner, Claudean Severance, MD ED   02/18/2020 2241 02/26/2020 2334 Full Code [id number]  Lewie Chamber, MD Inpatient   01/29/2020 1704 02/18/2020 2044 Full Code [id number]  Charlton Amor, PA-C Inpatient   01/29/2020 1704 01/29/2020 1704 Full Code [id number]  Charlton Amor, PA-C Inpatient   01/22/2020 2141 01/29/2020 1650 Full Code [id number]  Bedelia Person, MD Inpatient   01/21/2020 0012 01/22/2020 2141  Full Code [id number]  Eduard Clos, MD Inpatient   11/15/2019 2200 11/18/2019 2226 Full Code [id number]  Pearson Grippe, MD ED   04/15/2019 2122 04/21/2019 2320 Full Code [id number]  Rometta Emery, MD Inpatient   04/06/2017 2004 04/11/2017 2109 Full Code [id number]  Alberteen Sam, MD Inpatient   09/29/2016 1831 10/02/2016 1643 Full Code [id number]  Nadara Mustard, MD Inpatient   03/19/2013 1507 03/21/2013 1812 Full Code [id number]  Nadara Mustard, MD Inpatient   02/10/2013 2124 02/20/2013 1859 Full Code [id number]  Nadara Mustard, MD Inpatient   02/03/2013 2137 02/10/2013 2124 Full Code [id number]  Cristal Ford, MD Inpatient   01/11/2012 0612 01/13/2012 1645 Full Code [id number]  Wilder Glade, RN Inpatient       Prognosis:  Unable to determine  Discharge Planning: To Be Determined   Time spent: 25 min  Thank you for allowing the Palliative Medicine Team to assist in the care of this patient.     Sherryll Burger, NP  Please contact Palliative Medicine Team phone at [phone] for questions and concerns.

## 2023-03-15 ENCOUNTER — Encounter (HOSPITAL_COMMUNITY): Payer: Self-pay | Admitting: Vascular Surgery

## 2023-03-15 DIAGNOSIS — M4624 Osteomyelitis of vertebra, thoracic region: Secondary | ICD-10-CM | POA: Diagnosis not present

## 2023-03-15 LAB — RENAL FUNCTION PANEL
Albumin: 2.5 g/dL — ABNORMAL LOW (ref 3.5–5.0)
Anion gap: 8 (ref 5–15)
BUN: 16 mg/dL (ref 8–23)
CO2: 29 mmol/L (ref 22–32)
Calcium: 8.2 mg/dL — ABNORMAL LOW (ref 8.9–10.3)
Chloride: 97 mmol/L — ABNORMAL LOW (ref 98–111)
Creatinine, Ser: 2.06 mg/dL — ABNORMAL HIGH (ref 0.44–1.00)
GFR, Estimated: 26 mL/min — ABNORMAL LOW (ref >60)
Glucose, Bld: 82 mg/dL (ref 70–99)
Phosphorus: 2.6 mg/dL (ref 2.5–4.6)
Potassium: 3.9 mmol/L (ref 3.5–5.1)
Sodium: 134 mmol/L — ABNORMAL LOW (ref 135–145)

## 2023-03-15 LAB — GLUCOSE, CAPILLARY
Glucose-Capillary: 128 mg/dL — ABNORMAL HIGH (ref 70–99)
Glucose-Capillary: 136 mg/dL — ABNORMAL HIGH (ref 70–99)
Glucose-Capillary: 137 mg/dL — ABNORMAL HIGH (ref 70–99)
Glucose-Capillary: 153 mg/dL — ABNORMAL HIGH (ref 70–99)
Glucose-Capillary: 71 mg/dL (ref 70–99)
Glucose-Capillary: 89 mg/dL (ref 70–99)

## 2023-03-15 MED ORDER — POLYETHYLENE GLYCOL 3350 17 G PO PACK: 17.0000 g | PACK | Freq: Every day | ORAL | Status: DC | PRN

## 2023-03-15 MED ORDER — HYDROMORPHONE HCL 1 MG/ML IJ SOLN
2.0000 mg | INTRAMUSCULAR | Status: AC
  Administered 2023-03-15: 2 mg via INTRAVENOUS
  Filled 2023-03-15: qty 2

## 2023-03-15 MED ORDER — HYDROMORPHONE HCL 2 MG PO TABS
1.0000 mg | ORAL_TABLET | ORAL | Status: DC | PRN
  Administered 2023-03-15 – 2023-03-16 (×3): 1 mg via ORAL
  Filled 2023-03-15 (×3): qty 1

## 2023-03-15 MED ORDER — APIXABAN 2.5 MG PO TABS
2.5000 mg | ORAL_TABLET | Freq: Two times a day (BID) | ORAL | Status: DC
  Administered 2023-03-15 – 2023-04-03 (×34): 2.5 mg via ORAL
  Filled 2023-03-15 (×35): qty 1

## 2023-03-15 MED ORDER — HYDROMORPHONE HCL 2 MG PO TABS
2.0000 mg | ORAL_TABLET | Freq: Four times a day (QID) | ORAL | Status: DC
  Administered 2023-03-15 – 2023-03-16 (×4): 2 mg via ORAL
  Filled 2023-03-15 (×5): qty 1

## 2023-03-15 NOTE — Progress Notes (Addendum)
Occupational Therapy Treatment Patient Details Name: [name] [name] MRN: [id number] DOB: Sep 26, 1956 Today's Date: 03/15/2023   History of present illness Pt is 67 year old who is moslty wheelchair bound and has CKD stage V, hypertension, cirrhosis of the liver, OSA, morbid obesity, L1 osteomyelitis status post L1 corpectomy, T10-T11 discitis osteomyelitis status post posterior arthrodesis of T8-T11 and removal of T11 pedicle screws and rod in 12/23.  Last month, diagnosed with T4-T5 discitis osteomyelitis which was being treated with daptomycin.  Neurosurgery did not recommend any further interventions.     The patient returns to the hospital for worsening back pain for [redacted] week along with an episode of vomiting.  Pt found to have AKI requiring 2 cycles of HD.  Also, with ongoing osteomyelitis/discitis Thoracic spine with T4-5 disc aspiration by IR on 5/3.  Neurosurgery recommends further medical management, no surgical intervention at this time.   OT comments  Pt actively participated in therapy today, co-treat with PT due to decreased activity tolerance, increased pain, increased anxiety with transfers due to recent fall, to ensure Pt felt safe with OOB activities. Pt able to perform bed mobility min guard supine to sit, several VCs to scoot to get feet flat on floor, Pt anxious about falling, some dizziness noted when sitting upright, and standing, resolved after a couple of mins. Pt min guard for sit to stand and transfer to chair.  Pt worried that chair would be too uncomfortable and may not tolerate it, but seems to be comfy today, will try to sit upright for a few hours. Pt left with call light, feet up, chair alarm on, instructed to call if needed to get back in bed. Pt would benefit from continued skilled therapy to improve functional strength/endurance to improve participation.    Recommendations for follow up therapy are one component of a multi-disciplinary discharge planning process, led by  the attending physician.  Recommendations may be updated based on patient status, additional functional criteria and insurance authorization.    Assistance Recommended at Discharge Frequent or constant Supervision/Assistance  Patient can return home with the following  Help with stairs or ramp for entrance;Assist for transportation;Assistance with cooking/housework;A lot of help with bathing/dressing/bathroom;A lot of help with walking and/or transfers   Equipment Recommendations  None recommended by OT    Recommendations for Other Services      Precautions / Restrictions Precautions Precautions: Back Required Braces or Orthoses: Spinal Brace Spinal Brace: Thoracolumbosacral orthotic Spinal Brace Comments: No orders but pt had from prior admission and wears when OOB Restrictions Weight Bearing Restrictions: No       Mobility Bed Mobility Overal bed mobility: Needs Assistance Bed Mobility: Supine to Sit Rolling: Supervision   Supine to sit: Min guard     General bed mobility comments: verbal cue and min guard for supine to sit on EOB, had fall recently, anxious about falling.    Transfers Overall transfer level: Needs assistance Equipment used: Rolling walker (2 wheels), 1 person hand held assist Transfers: Sit to/from Stand, Bed to chair/wheelchair/BSC Sit to Stand: Min guard     Step pivot transfers: Min guard     General transfer comment: Pt improving with OOB activity tolerance, able to perform transfer from bed to chair with min guard     Balance Overall balance assessment: Needs assistance Sitting-balance support: Feet supported Sitting balance-Leahy Scale: Good Sitting balance - Comments: sitting EOB   Standing balance support: Reliant on assistive device for balance Standing balance-Leahy Scale: Poor Standing balance comment: standing  balance fair, some dizziness, decreased activity tolerance, min guard                           ADL either  performed or assessed with clinical judgement   ADL                                              Extremity/Trunk Assessment              Vision       Perception     Praxis      Cognition Arousal/Alertness: Awake/alert Behavior During Therapy: WFL for tasks assessed/performed Overall Cognitive Status: Within Functional Limits for tasks assessed                                 General Comments: Pt did well today, tolerated transfer, spending time in bedside chair, no complaints other than pain.        Exercises      Shoulder Instructions       General Comments      Pertinent Vitals/ Pain       Pain Assessment Pain Assessment: 0-10 Pain Score: 6  Faces Pain Scale: Hurts even more Pain Location: back, chest, and R side Pain Descriptors / Indicators: Grimacing, Guarding Pain Intervention(s): Monitored during session  Home Living                                          Prior Functioning/Environment              Frequency  Min 2X/week        Progress Toward Goals  OT Goals(current goals can now be found in the care plan section)  Progress towards OT goals: Progressing toward goals  Acute Rehab OT Goals Patient Stated Goal: to decrease and manage pain levels OT Goal Formulation: With patient Time For Goal Achievement: 03/19/23 Potential to Achieve Goals: Good ADL Goals Pt Will Perform Grooming: with set-up;sitting Pt Will Transfer to Toilet: with supervision;regular height toilet;stand pivot transfer;bedside commode  Plan Discharge plan remains appropriate    Co-evaluation    PT/OT/SLP Co-Evaluation/Treatment: Yes Reason for Co-Treatment: Complexity of the patient's impairments (multi-system involvement);To address functional/ADL transfers   OT goals addressed during session: ADL's and self-care;Proper use of Adaptive equipment and DME      AM-PAC OT "6 Clicks" Daily Activity      Outcome Measure   Help from another person eating meals?: None Help from another person taking care of personal grooming?: A Little Help from another person toileting, which includes using toliet, bedpan, or urinal?: A Lot Help from another person bathing (including washing, rinsing, drying)?: A Lot Help from another person to put on and taking off regular upper body clothing?: A Lot Help from another person to put on and taking off regular lower body clothing?: A Lot 6 Click Score: 15    End of Session Equipment Utilized During Treatment: Rolling walker (2 wheels)  OT Visit Diagnosis: Unsteadiness on feet (R26.81)   Activity Tolerance Patient tolerated treatment well   Patient Left in chair;with call bell/phone within reach;with chair alarm set   Nurse Communication Patient requests  pain meds        Time: 1610-9604 OT Time Calculation (min): 27 min  Charges: OT General Charges $OT Visit: 1 Visit OT Treatments $Therapeutic Activity: 8-22 mins  [address] OTR/L   Alexis Goodell 03/15/2023, 3:15 PM

## 2023-03-15 NOTE — Progress Notes (Signed)
[facility] Progress Note Patient: [name] [name] ZOX:[id number] DOB: 04-Jan-1956 DOA: 02/26/2023  DOS: the patient was seen and examined on 03/15/2023  Brief hospital course: PMH of type II DM, neuropathy, thrombocytopenia, OSA, osteomyelitis, hypothyroidism, HTN, CAD, cirrhosis, ESRD now on HD, spinal stenosis SP lumbar fusion, history of renal transplant, PTSD, depression, obesity, fibromyalgia presented to the hospital with complaints of back pain.  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  Assessment and Plan: Osteomyelitis discitis thoracic spine persistent at T4, T5 Presents with back pain. Negative blood culture 4/29 Biopsy results no growth to date from aspiration Per ID-complete IV daptomycin 04/13/2023, OP F/u 04/12/2023 Continue current pain control.   ATN/AKI on causing ESRD TDC placement 5/13. AVF placement 5/15. Will need to be able to tolerate HD in recliner for outpatient HD. Currently awaiting clip.  Type 2 diabetes mellitus, uncontrolled with hyperglycemia with nephropathy. 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 Hemoglobin A1c less than 5.   Diarrhea C. difficile antigen positive but toxigenic PCR negative. Currently resolved. Will monitor.  If no further diarrhea on 5/16 will discuss with ID to discontinue enteral precautions.   Urinary Retention. Foley Catheter Placed on 5/10. No prior history of urinary retention.  Once the patient is able to mobilize well we will discontinue Foley catheter.  Unable to tolerate PureWick due to allergic reaction to some substance in PureWick.   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  Monitor.   Normocytic anemia Iron studies 5/3 did not show any deficiency and saturations are good Management per nephrology.  Chronic HFpEF EF 65-70%.  Appears euvolemic. Volume management  with HD.  HTN Monitor for now. Blood pressure soft.   Chronic lower extremity DVTs (unclear when)  currently on apixaban 2.5 twice daily    Depression/anxiety  continue Cymbalta 60 daily, diazepam 5 every 12 as needed anxiety  Fall in the OR. Patient had a fall in the OR while being transferred from the OR table to the stretcher. CT chest abdomen pelvis shows no evidence of acute trauma. CT head negative as well. X-ray shoulder of the left arm negative for any acute fracture. Currently has a sling for pain control.  Subjective: Pain still present.  No nausea no vomiting no fever no chills.    Physical Exam: In mild distress. No rash. S1-S2 present.  Systolic murmur Clear to auscultation. Was not present. Trace edema of the lower extremity. Alert awake and oriented x 3.  No focal deficit.  Data Reviewed: I have Reviewed nursing notes, Vitals, and Lab results. Since last encounter, pertinent lab results CBC and BMP   . I have ordered test including CBC and BMP  . I have discussed pt's care plan and test results with nephrology  .    Disposition: Status is: Inpatient Remains inpatient appropriate because: Need PT OT evaluation and improvement in mobility.  apixaban (ELIQUIS) tablet 2.5 mg Start: 03/15/23 1145 Place and maintain sequential compression device Start: 03/01/23 1536 apixaban (ELIQUIS) tablet 2.5 mg   Family Communication: Caregiver at bedside Level of care: Med-Surg   Vitals:   03/15/23 0500 03/15/23 0815 03/15/23 0900 03/15/23 1254  BP:   131/87 (!) 114/53  Pulse:   81 94  Resp:   17 20  Temp:   98.2 F (36.8 C) 98.1 F (36.7 C)  TempSrc:   Oral Oral  SpO2:  98% 99% 99%  Weight:  71.4 kg     Height:         Author: Lynden Oxford, MD 03/15/2023 4:01 PM  Please look on [email or web address] to find out who is on call.

## 2023-03-15 NOTE — Progress Notes (Signed)
Contacted [facility] admissions to request an update on pt's referral. Clinic may be unable to provide approval until it is known if pt is able to tolerate sitting in a chair for an extended period of time. Will await response.   Olivia Canter Renal Navigator [phone]

## 2023-03-15 NOTE — Progress Notes (Signed)
Daily Progress Note   Patient Name: [name] [name]       Date: 03/15/2023 DOB: Jul 20, 1956  Age: 68 y.o. MRN#: [id number] Attending Physician: Rolly Salter, MD Primary Care Physician: Zola Button, Grayling Congress, DO Admit Date: 02/26/2023  Reason for Consultation/Follow-up: Establishing goals of care and Pain control  Subjective: Patient was laying in bed talking with her close friend. She states she is in 10/10 pain due to the fall yesterday in the OR.  Length of Stay: 16  Current Medications: Scheduled Meds:  . sodium chloride   Intravenous Once  . acetaminophen  1,000 mg Oral TID  . acidophilus  2 capsule Oral TID  . calcium carbonate  1 tablet Oral Daily  . Chlorhexidine Gluconate Cloth  6 each Topical Q0600  . cholestyramine  4 g Oral BID  . [START ON 03/16/2023] darbepoetin (ARANESP) injection - NON-DIALYSIS  100 mcg Subcutaneous Q Fri-1800  . diclofenac  1 patch Transdermal BID  . DULoxetine  60 mg Oral Daily  . feeding supplement  237 mL Oral BID BM  . fentaNYL (SUBLIMAZE) injection  50 mcg Intravenous Once  . furosemide  40 mg Intravenous Q12H  . HYDROmorphone  1 mg Oral TID  . lidocaine  2 patch Transdermal Q24H  . melatonin  3 mg Oral QHS  . methocarbamol  750 mg Oral TID  . mometasone-formoterol  2 puff Inhalation BID  . pregabalin  75 mg Oral Daily  . sodium chloride flush  10-40 mL Intracatheter Q12H  . sodium chloride flush  3 mL Intravenous Q12H    Continuous Infusions: . DAPTOmycin (CUBICIN) 650 mg in sodium chloride 0.9 % IVPB Stopped (03/13/23 1732)    PRN Meds: albuterol, alteplase, diazepam, heparin, hydrALAZINE, HYDROmorphone, lidocaine (PF), lidocaine-prilocaine, naLOXone (NARCAN)  injection, ondansetron (ZOFRAN) IV, mouth rinse,  pentafluoroprop-tetrafluoroeth  Physical Exam Vitals reviewed.  Constitutional:      Appearance: She is ill-appearing.  Pulmonary:     Effort: Pulmonary effort is normal.  Skin:    General: Skin is warm and dry.  Neurological:     Mental Status: She is alert.  Psychiatric:        Mood and Affect: Mood normal.        Behavior: Behavior normal.            Vital  Signs: BP 131/87 (BP Location: Right Arm)   Pulse 81   Temp 98.2 F (36.8 C) (Oral)   Resp 17   Ht 4\' 11"  (1.499 m)   Wt 71.4 kg   SpO2 99%   BMI 31.79 kg/m  SpO2: SpO2: 99 % O2 Device: O2 Device: Nasal Cannula O2 Flow Rate: O2 Flow Rate (L/min): 3 L/min  Intake/output summary:  Intake/Output Summary (Last 24 hours) at 03/15/2023 0921 Last data filed at 03/15/2023 0433 Gross per 24 hour  Intake 450 ml  Output 1830 ml  Net -1380 ml   LBM: Last BM Date : 03/13/23 Baseline Weight: Weight: 82.1 kg Most recent weight: Weight: 71.4 kg       Palliative Assessment/Data:40%      Patient Active Problem List   Diagnosis Date Noted  . Acute osteomyelitis of thoracic spine (HCC) 02/27/2023  . Vertebral osteomyelitis (HCC) 01/28/2023  . 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 Osteomyelitis (HCC) 06/20/2022  . Estrogen deficiency 05/25/2022  . CKD (chronic kidney disease) stage 5, GFR less than 15 ml/min (HCC) 02/14/2022  . Prolonged QT interval 01/07/2022  . Streptococcal bacteremia   . Osteomyelitis of left foot (HCC) 10/16/2021  . Diabetic foot ulcers (HCC) 10/16/2021  . Portal hypertension (HCC) 08/16/2021  . Eczema of scalp 06/27/2021  . Hypothyroidism 06/27/2021  . Pruritus 06/27/2021  . Seasonal allergic rhinitis due to pollen 02/02/2021  . PTSD (post-traumatic stress disorder)   . Osteoarthritis   . MRSA (methicillin resistant Staphylococcus aureus)   . Hypertension   . High cholesterol   . Hepatic steatosis   . GERD (gastroesophageal reflux disease)   . Fibromyalgia   . Episode of visual loss of  left eye   . Depression   . Constipation   . Cataract   . Bulging lumbar disc   . Asthma   . Anxiety   . Anemia   . Tinea capitis 10/11/2020  . Tinea corporis 10/11/2020  . Hip pain 05/16/2020  . IDA (iron deficiency anemia) 05/04/2020  . 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 S/P lumbar fusion 03/04/2020  . Spinal stenosis of lumbar region 01/22/2020  . 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 Compression fracture of L1 lumbar vertebra (HCC) 11/17/2019  . Fracture of multiple ribs 11/17/2019  . Nausea & vomiting 11/15/2019  . Diarrhea 09/04/2019  . Pressure injury of skin 04/17/2019  . Septic arthritis of elbow, right (HCC) 04/17/2019  . 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 Sleep apnea 11/16/2017  . Diabetic foot infection (HCC) 04/06/2017  . Type 2 diabetes mellitus with hyperglycemia, with long-term current use of insulin (HCC) 04/06/2017  . Neuropathy 05/05/2015  . Diabetic peripheral neuropathy (HCC) 01/12/2015  . CKD (chronic kidney disease), stage III (HCC) 05/27/2014  . History of MI (myocardial infarction) 10/06/2013  . Chest pain 10/06/2013  . Nausea and vomiting 10/06/2013  . Obesity (BMI 30-39.9) 09/20/2013  . 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 Normocytic anemia 02/03/2013  . CAD (coronary artery disease) 01/11/2012  . UTI (urinary tract infection) 08/05/2010  . Hepatic cirrhosis (HCC) 07/06/2010  . 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  . Hyperlipidemia 06/03/2008  . Essential hypertension 12/23/2006  . Acute MI [facility]) 2007    Palliative Care Assessment & Plan   Patient Profile: [name] [name] is a 67 y.o. adult with medical history significant of CKD 5,diabetes, neuropathy, thrombocytopenia, OSA, osteomyelitis, diabetic foot infections, anemia, hypothyroidism, hypertension, hyperlipidemia, CAD,  cirrhosis, asthma, spinal stenosis, status post lumbar fusion,  status post renal transplant (age 89), PTSD, depression, anxiety, obesity, fibromyalgia, DVT, cataracts presenting with worsening back pain, nausea, vomiting. She presented to the hospital for back pain, abdominal pain, nausea, and vomiting and was admitted on 02/26/23.   Assessment: Patient had AV fistula placed 03/14/23. She had a fall in the OR off the OR table which has increased her pain-- specifically on her left side. She received 11mg  of dilaudid over the last 24 hours with 3 prn doses. We discussed changing her pain medication to increase her scheduled doses. Will also give a one time IV dose. Reinforced the importance of requesting prn doses if she is in pain. She has not sat during a dialysis treatment yet. We discussed utilizing her prn medication for dialysis and when she attempts to sit during dialysis. We also discussed constipation prevention with opioid use. Will add Miralax prn.  In addition to the pain she reports the melatonin made her very sleepy so will discontinue it.   [name] [name] says her and her husband began discussing her code status but she was so busy with procedures and the fall yesterday that the conversation stopped. I encouraged her to continue having conversations about this important topic.  Recommendations/Plan: Full Code/Full Scope Encourage patient and husband to continue conversation around code status Continued PMT support  Symptom Management:  Chronic Pain Continue tylenol 1000 mg tid Continue diclofenac (Flector) 1.3% patch bid Continue lidocaine patch 5% 2 patches daily Continue Robaxin 750mg  tid Change scheduled dilaudid to 2mg  tab q6hr Change PRN dilaudid to 1mg  tab q4hrs One time dose dilaudid 2mg  IV Continue duloxetine (Cymbalta) 60mg  daily Continue pregabalin (Lyrica) 75mg  daily Add Miralax 17g prn Nausea Continue zofran 4mg  IV q6hr prn Anxiety Continue duloxetine (Cymbalta) 60mg  daily Continue pregabalin (Lyrica) 75mg  daily Continue diazepam  (Valium) 5 mg q12hr prn Insomnia Discontinue melatonin due to morning sleepiness   Goals of Care and Additional Recommendations: Limitations on Scope of Treatment: Full Scope Treatment  Code Status:    Code Status Orders  (From admission, onward)           Start     Ordered   02/26/23 1448  Full code  Continuous       Question:  By:  Answer:  Consent: discussion documented in EHR   02/26/23 1449           Code Status History     Date Active Date Inactive Code Status Order ID Comments User Context   01/28/2023 2336 02/04/2023 1730 Full Code [id number]  John Giovanni, MD ED   06/21/2022 0025 07/11/2022 2159 Full Code [id number]  Carollee Herter, DO ED   01/07/2022 0511 01/09/2022 2045 Full Code [id number]  Angie Fava, DO Inpatient   10/17/2021 0420 10/22/2021 1928 Full Code [id number]  Chotiner, Claudean Severance, MD ED   02/18/2020 2241 02/26/2020 2334 Full Code [id number]  Lewie Chamber, MD Inpatient   01/29/2020 1704 02/18/2020 2044 Full Code [id number]  Charlton Amor, PA-C Inpatient   01/29/2020 1704 01/29/2020 1704 Full Code [id number]  Charlton Amor, PA-C Inpatient   01/22/2020 2141 01/29/2020 1650 Full Code [id number]  Bedelia Person, MD Inpatient   01/21/2020 0012 01/22/2020 2141 Full Code [id number]  Eduard Clos, MD Inpatient   11/15/2019 2200 11/18/2019 2226 Full Code [id number]  Pearson Grippe, MD ED   04/15/2019 2122 04/21/2019 2320 Full Code [id number]  Rometta Emery, MD Inpatient   04/06/2017 2004 04/11/2017 2109 Full  Code [id number]  Alberteen Sam, MD Inpatient   09/29/2016 1831 10/02/2016 1643 Full Code [id number]  Nadara Mustard, MD Inpatient   03/19/2013 1507 03/21/2013 1812 Full Code [id number]  Nadara Mustard, MD Inpatient   02/10/2013 2124 02/20/2013 1859 Full Code [id number]  Nadara Mustard, MD Inpatient   02/03/2013 2137 02/10/2013 2124 Full Code [id number]  Cristal Ford, MD Inpatient   01/11/2012 0612 01/13/2012 1645 Full Code [id number]  Wilder Glade, RN  Inpatient       Prognosis:  Unable to determine  Discharge Planning: To Be Determined  Time spent: 60 min  Care plan was discussed with bedside RN and attending provider Dr. Allena Katz.  Thank you for allowing the Palliative Medicine Team to assist in the care of this patient.    Sherryll Burger, NP  Please contact Palliative Medicine Team phone at [phone] for questions and concerns.

## 2023-03-15 NOTE — Progress Notes (Signed)
Physical Therapy Treatment Patient Details Name: [name] [name] MRN: [id number] DOB: August 13, 1956 Today's Date: 03/15/2023   History of Present Illness Pt is 67 year old who is moslty wheelchair bound and has CKD stage V, hypertension, cirrhosis of the liver, OSA, morbid obesity, L1 osteomyelitis status post L1 corpectomy, T10-T11 discitis osteomyelitis status post posterior arthrodesis of T8-T11 and removal of T11 pedicle screws and rod in 12/23.  Last month, diagnosed with T4-T5 discitis osteomyelitis which was being treated with daptomycin.  Neurosurgery did not recommend any further interventions.     The patient returns to the hospital for worsening back pain for [redacted] week along with an episode of vomiting.  Pt found to have AKI requiring 2 cycles of HD.  Also, with ongoing osteomyelitis/discitis Thoracic spine with T4-5 disc aspiration by IR on 5/3.  Neurosurgery recommends further medical management, no surgical intervention at this time.    PT Comments    Pt received in supine and agreeable to session with encouragement. Pt reporting increased pain and fear of falling this session due to fall yesterday. Pt able to perform all mobility tasks with increased time and up to min guard this session. Pt requiring cues to maintain back precautions. Pt reporting that she does not mind sitting up, however the recliner is very uncomfortable and increases her back pain. Pt was positioned as comfortably as possible and encouraged to remain sitting in the recliner for as long as she can tolerate. Pt continues to benefit from PT services to progress toward functional mobility goals.    Recommendations for follow up therapy are one component of a multi-disciplinary discharge planning process, led by the attending physician.  Recommendations may be updated based on patient status, additional functional criteria and insurance authorization.     Assistance Recommended at Discharge Intermittent  Supervision/Assistance  Patient can return home with the following A little help with walking and/or transfers;A little help with bathing/dressing/bathroom;Assistance with cooking/housework;Help with stairs or ramp for entrance   Equipment Recommendations  None recommended by PT    Recommendations for Other Services       Precautions / Restrictions Precautions Precautions: Back Precaution Booklet Issued: No Precaution Comments: verbally reviewed precautions Required Braces or Orthoses: Spinal Brace Spinal Brace: Thoracolumbosacral orthotic Spinal Brace Comments: No orders but pt had from prior admission and wears when OOB Restrictions Weight Bearing Restrictions: No     Mobility  Bed Mobility Overal bed mobility: Needs Assistance Bed Mobility: Sidelying to Sit, Rolling Rolling: Supervision Sidelying to sit: Min guard       General bed mobility comments: Cues for logroll technique and min guard for safety. Cues to scoot forward to EOB.    Transfers Overall transfer level: Needs assistance Equipment used: Rolling walker (2 wheels) Transfers: Sit to/from Stand, Bed to chair/wheelchair/BSC Sit to Stand: Min guard   Step pivot transfers: Min guard       General transfer comment: Pt able to stand and step pivot to recliner with RW support and close min guard for safety. Pt requiring encouragment due to fear of falling.    Ambulation/Gait               General Gait Details: unable to progress due to pain.       Balance Overall balance assessment: Needs assistance Sitting-balance support: Feet supported Sitting balance-Leahy Scale: Good Sitting balance - Comments: sitting EOB   Standing balance support: Reliant on assistive device for balance, Bilateral upper extremity supported, During functional activity Standing balance-Leahy Scale: Poor Standing  balance comment: with RW support                            Cognition Arousal/Alertness:  Awake/alert Behavior During Therapy: WFL for tasks assessed/performed, Anxious Overall Cognitive Status: Within Functional Limits for tasks assessed                                 General Comments: Pt anxious about falling, but was able to participate in mobility tasks with encouragement.        Exercises      General Comments General comments (skin integrity, edema, etc.): Pt reporting dizziness upon sitting and standing that improved quickly.      Pertinent Vitals/Pain Pain Assessment Pain Assessment: Faces Faces Pain Scale: Hurts even more Pain Location: back, chest, and R side Pain Descriptors / Indicators: Grimacing, Guarding Pain Intervention(s): Limited activity within patient's tolerance, Monitored during session, Repositioned     PT Goals (current goals can now be found in the care plan section) Acute Rehab PT Goals Patient Stated Goal: return home PT Goal Formulation: With patient/family Time For Goal Achievement: 03/20/23 Potential to Achieve Goals: Good Progress towards PT goals: Progressing toward goals    Frequency    Min 3X/week      PT Plan Current plan remains appropriate    Co-evaluation   Reason for Co-Treatment: Complexity of the patient's impairments (multi-system involvement);To address functional/ADL transfers   OT goals addressed during session: ADL's and self-care;Proper use of Adaptive equipment and DME      AM-PAC PT "6 Clicks" Mobility   Outcome Measure  Help needed turning from your back to your side while in a flat bed without using bedrails?: A Little Help needed moving from lying on your back to sitting on the side of a flat bed without using bedrails?: A Little Help needed moving to and from a bed to a chair (including a wheelchair)?: A Little Help needed standing up from a chair using your arms (e.g., wheelchair or bedside chair)?: A Little Help needed to walk in hospital room?: Total Help needed climbing 3-5  steps with a railing? : Total 6 Click Score: 14    End of Session   Activity Tolerance: Patient limited by pain Patient left: in chair;with chair alarm set;with call bell/phone within reach Nurse Communication: Mobility status PT Visit Diagnosis: Other abnormalities of gait and mobility (R26.89);Muscle weakness (generalized) (M62.81);Pain     Time: 4098-1191 PT Time Calculation (min) (ACUTE ONLY): 21 min  Charges:  $Therapeutic Activity: 8-22 mins                     Johny Shock, PTA Acute Rehabilitation Services Secure Chat Preferred  [phone]    Johny Shock 03/15/2023, 4:05 PM

## 2023-03-15 NOTE — Progress Notes (Signed)
[facility] Progress Note   Assessment/ Plan:   New ESRD: Baseline creatinine was around 4.5 prior to admission.  AKI likely ATN.  Did require dialysis earlier in the hospitalization.  Had some improvement in kidney function as well as urine output and dialysis catheter was removed.  Now with persistent low urine output and rising creatinine most likely ESRD at this point  -[facility] placement 03/12/23, appreciate assistance -Vascular surgery following, appreciate help.   -CLIP in process - HD 03/14/23 after AVF -Continue to monitor daily Cr, Dose meds for GFR -Monitor Daily I/Os, Daily weight  -Maintain MAP>65 for optimal renal perfusion.  -Avoid nephrotoxic medications including NSAIDs -Use synthetic opioids (Fentanyl/Dilaudid) if needed - next HD 5/17- will do first shift per pt request   Anemia: Likely multifactorial with inflammation and CKD contributing.  Increased ESA to 100 mcg weekly.  Iron replete.   HFpEF: Volume status overall acceptable.  Continue to monitor   Osteomyelitis: On daptomycin.  Per primary team   DM2: Management per primary   CKD BMD: phos acceptable. Ca corrects near normal. F/u PTH    Subjective:    S/p AVF yesterday, had dialysis.  Had fall yesterday off OR table.  Had scans- no fracture   Objective:   BP 131/87 (BP Location: Right Arm)   Pulse 81   Temp 98.2 F (36.8 C) (Oral)   Resp 17   Ht 4\' 11"  (1.499 m)   Wt 71.4 kg   SpO2 99%   BMI 31.79 kg/m   Physical Exam: Gen:NAD CVS: RRR Resp: clear Abd: soft Ext: no  LE edema  Labs: BMET Recent Labs  Lab 03/09/23 0258 03/10/23 0029 03/11/23 0309 03/12/23 0500 03/13/23 0949 03/14/23 0355 03/14/23 1341 03/15/23 0029  NA 134* 134* 132* 137 136 137 140 134*  K 4.2 4.6 4.1 5.0 4.0 4.2 5.1 3.9  CL 102 102 101 104 102 103 103 97*  CO2 25 23 22 23 25 25   --  29  GLUCOSE 106* 86 105* 110* 79 108* 95 82  BUN 39* 44* 49* 50* 28* 40* 39* 16  CREATININE 4.89* 5.03* 4.96* 4.54*  3.06* 3.84* 4.60* 2.06*  CALCIUM 8.0* 7.9* 7.6* 7.8* 7.9* 7.6*  --  8.2*  PHOS 4.1 4.5 5.0* 4.6 4.1 3.9  --  2.6   CBC Recent Labs  Lab 03/12/23 2255 03/14/23 1341 03/14/23 2113  WBC 4.8  --  4.3  HGB 7.1* 8.8* 7.2*  HCT 24.5* 26.0* 24.7*  MCV 96.1  --  93.9  PLT 123*  --  115*      Medications:     sodium chloride   Intravenous Once   acetaminophen  1,000 mg Oral TID   acidophilus  2 capsule Oral TID   apixaban  2.5 mg Oral BID   calcium carbonate  1 tablet Oral Daily   Chlorhexidine Gluconate Cloth  6 each Topical Q0600   cholestyramine  4 g Oral BID   [START ON 03/16/2023] darbepoetin (ARANESP) injection - NON-DIALYSIS  100 mcg Subcutaneous Q Fri-1800   diclofenac  1 patch Transdermal BID   DULoxetine  60 mg Oral Daily   feeding supplement  237 mL Oral BID BM   fentaNYL (SUBLIMAZE) injection  50 mcg Intravenous Once   furosemide  40 mg Intravenous Q12H   HYDROmorphone  2 mg Oral Q6H   lidocaine  2 patch Transdermal Q24H   methocarbamol  750 mg Oral TID   mometasone-formoterol  2 puff Inhalation BID  pregabalin  75 mg Oral Daily   sodium chloride flush  10-40 mL Intracatheter Q12H   sodium chloride flush  3 mL Intravenous Q12H     Bufford Buttner MD 03/15/2023, 12:30 PM

## 2023-03-15 NOTE — Progress Notes (Addendum)
Progress Note    03/15/2023 7:29 AM 1 Day Post-Op  Subjective:  Denies any left arm or hand pain, numbness or weakness. She is having some minor incisional pain. Only complaints are her shoulder and back after noted fall yesterday in OR   Vitals:   03/15/23 0038 03/15/23 0433  BP: (!) 131/59 129/62  Pulse: 88 83  Resp: 18 18  Temp: 98.2 F (36.8 C) 98.5 F (36.9 C)  SpO2: 100% 98%   Physical Exam: Cardiac:  regular Lungs:  non labored Incisions:  left AC incision is intact and well appearing without swelling or hematoma Extremities:  Left AV fistula with good thrill  Neurologic: alert and oriented  CBC    Component Value Date/Time   WBC 4.3 03/14/2023 2113   RBC 2.63 (L) 03/14/2023 2113   HGB 7.2 (L) 03/14/2023 2113   HGB 8.3 (L) 01/12/2023 1419   HGB 15.0 11/30/2008 0927   HCT 24.7 (L) 03/14/2023 2113   HCT 43.8 11/30/2008 0927   PLT 115 (L) 03/14/2023 2113   PLT 120 (L) 01/12/2023 1419   PLT 133 (L) 11/30/2008 0927   MCV 93.9 03/14/2023 2113   MCV 83.9 11/30/2008 0927   MCH 27.4 03/14/2023 2113   MCHC 29.1 (L) 03/14/2023 2113   RDW 17.2 (H) 03/14/2023 2113   RDW 13.0 11/30/2008 0927   LYMPHSABS 0.6 (L) 02/26/2023 0825   LYMPHSABS 1.2 11/30/2008 0927   MONOABS 0.2 02/26/2023 0825   MONOABS 0.3 11/30/2008 0927   EOSABS 0.1 02/26/2023 0825   EOSABS 0.1 11/30/2008 0927   BASOSABS 0.0 02/26/2023 0825   BASOSABS 0.0 11/30/2008 0927    BMET    Component Value Date/Time   NA 134 (L) 03/15/2023 0029   K 3.9 03/15/2023 0029   CL 97 (L) 03/15/2023 0029   CO2 29 03/15/2023 0029   GLUCOSE 82 03/15/2023 0029   BUN 16 03/15/2023 0029   CREATININE 2.06 (H) 03/15/2023 0029   CREATININE 5.69 (H) 02/16/2023 1520   CALCIUM 8.2 (L) 03/15/2023 0029   GFRNONAA 26 (L) 03/15/2023 0029   GFRNONAA 10 (L) 01/12/2023 1419   GFRAA 31 (L) 06/16/2020 1403    INR    Component Value Date/Time   INR 1.3 (H) 02/26/2023 0825     Intake/Output Summary (Last 24 hours) at  03/15/2023 0729 Last data filed at 03/15/2023 0433 Gross per 24 hour  Intake 450 ml  Output 1830 ml  Net -1380 ml     Assessment/Plan:  67 y.o. adult is s/p left brachiocephalic arteriovenous fistula placement 1 Day Post-Op   Patent left AV fistula Left AC Incision is intact without swelling or hematoma No signs or symptoms of steal Will arrange follow up in 4-6 weeks with fistula duplex  Graceann Congress, PA-C [facility] [phone] 03/15/2023 7:29 AM  I have seen and evaluated the patient. I agree with the PA note as documented above.  Postop day 1 status post left brachiocephalic AV fistula.  Fistula has an excellent thrill.  Incision looks great.  Palpable radial pulse at the wrist.  No signs of steal.  Will arrange follow-up in 4 to 6 weeks with fistula duplex.  She did have a fall off the OR table yesterday after the case was complete and I had left the OR.  She got CT head as well as chest abdomen pelvis last night with no acute injuries.  Shoulder x-ray no injury acutely.  Appears a little bit sore today but overall feeling  better.    Cephus Shelling, MD [facility] Office: [phone]

## 2023-03-16 DIAGNOSIS — M4624 Osteomyelitis of vertebra, thoracic region: Secondary | ICD-10-CM | POA: Diagnosis not present

## 2023-03-16 DIAGNOSIS — Z515 Encounter for palliative care: Secondary | ICD-10-CM | POA: Diagnosis not present

## 2023-03-16 LAB — GLUCOSE, CAPILLARY
Glucose-Capillary: 84 mg/dL (ref 70–99)
Glucose-Capillary: 119 mg/dL — ABNORMAL HIGH (ref 70–99)
Glucose-Capillary: 107 mg/dL — ABNORMAL HIGH (ref 70–99)

## 2023-03-16 LAB — RENAL FUNCTION PANEL
Albumin: 2.5 g/dL — ABNORMAL LOW (ref 3.5–5.0)
Anion gap: 9 (ref 5–15)
BUN: 29 mg/dL — ABNORMAL HIGH (ref 8–23)
CO2: 27 mmol/L (ref 22–32)
Calcium: 8.5 mg/dL — ABNORMAL LOW (ref 8.9–10.3)
Chloride: 95 mmol/L — ABNORMAL LOW (ref 98–111)
Creatinine, Ser: 3.53 mg/dL — ABNORMAL HIGH (ref 0.44–1.00)
GFR, Estimated: 14 mL/min — ABNORMAL LOW (ref >60)
Glucose, Bld: 83 mg/dL (ref 70–99)
Phosphorus: 3.5 mg/dL (ref 2.5–4.6)
Potassium: 3.9 mmol/L (ref 3.5–5.1)
Sodium: 131 mmol/L — ABNORMAL LOW (ref 135–145)

## 2023-03-16 LAB — CBC
HCT: 23.8 % — ABNORMAL LOW (ref 36.0–46.0)
HCT: 23.6 % — ABNORMAL LOW (ref 36.0–46.0)
Hemoglobin: 7.1 g/dL — ABNORMAL LOW (ref 12.0–15.0)
Hemoglobin: 7 g/dL — ABNORMAL LOW (ref 12.0–15.0)
MCH: 27.8 pg (ref 26.0–34.0)
MCH: 27.9 pg (ref 26.0–34.0)
MCHC: 29.8 g/dL — ABNORMAL LOW (ref 30.0–36.0)
MCHC: 29.7 g/dL — ABNORMAL LOW (ref 30.0–36.0)
MCV: 93.3 fL (ref 80.0–100.0)
MCV: 94 fL (ref 80.0–100.0)
Platelets: 116 10*3/uL — ABNORMAL LOW (ref 150–400)
Platelets: 117 10*3/uL — ABNORMAL LOW (ref 150–400)
RBC: 2.55 MIL/uL — ABNORMAL LOW (ref 3.87–5.11)
RBC: 2.51 MIL/uL — ABNORMAL LOW (ref 3.87–5.11)
RDW: 17.2 % — ABNORMAL HIGH (ref 11.5–15.5)
RDW: 16.8 % — ABNORMAL HIGH (ref 11.5–15.5)
WBC: 4.2 10*3/uL (ref 4.0–10.5)
WBC: 3.9 10*3/uL — ABNORMAL LOW (ref 4.0–10.5)
nRBC: 0 % (ref 0.0–0.2)
nRBC: 0 % (ref 0.0–0.2)

## 2023-03-16 MED ORDER — ALUM & MAG HYDROXIDE-SIMETH 200-200-20 MG/5ML PO SUSP
15.0000 mL | ORAL | Status: DC | PRN
  Administered 2023-03-16 (×2): 15 mL via ORAL
  Filled 2023-03-16 (×2): qty 30

## 2023-03-16 MED ORDER — HYDROMORPHONE HCL 2 MG PO TABS
1.0000 mg | ORAL_TABLET | ORAL | Status: DC | PRN
  Administered 2023-03-17: 2 mg via ORAL
  Filled 2023-03-16: qty 1

## 2023-03-16 MED ORDER — FENTANYL CITRATE PF 50 MCG/ML IJ SOSY
50.0000 ug | PREFILLED_SYRINGE | Freq: Once | INTRAMUSCULAR | Status: AC
  Administered 2023-03-16: 50 ug via INTRAVENOUS
  Filled 2023-03-16: qty 1

## 2023-03-16 MED ORDER — HYDROMORPHONE HCL 2 MG PO TABS
3.0000 mg | ORAL_TABLET | Freq: Four times a day (QID) | ORAL | Status: DC
  Administered 2023-03-16 – 2023-03-17 (×3): 3 mg via ORAL
  Filled 2023-03-16 (×3): qty 2

## 2023-03-16 NOTE — Progress Notes (Addendum)
Pt has been accepted at [facility] pending pt is able to sit in a chair for an extended period of time. Pt's schedule would be TTS with 11:30 chair time. Pt can start as soon as Tuesday if able to tolerate sitting. For first appt, pt needs to arrive at 11:00 am to complete paperwork prior to treatment. Update provided to attending and nephrologist regarding above info. Met with pt at bedside. Discussed that above arrangements are dependent upon pt being able to sit for extended period of time. Explained to pt why it is important to sit while hospitalized which is to ensure pt is appropriate for out-pt HD at d/c. Pt voices understanding and states she was able to sit in her recliner in her room yesterday for around 3-3.5 hrs (per pt report). Explained to pt that staff do realize pt's pain is significant and will continue to work with pt in order for pt to meet this goal. Schedule letter provided to pt and left at bedside. Pt plans to discuss out-pt HD arrangements with pt's husband when he arrives later today to visit. Pt states she needs to make sure that husband is agreeable to plan since he works and he will be providing transport to/from HD. Pt prefers a MWF 2nd shift appt and advised that clinic does not currently have a MWF available but pt can be placed on a waiting list at clinic for when a MWF becomes available. Pt voices understanding. Contacted [facility] to advise clinic that pt may require iv daptomycin with HD at d/c. Clinic confirms abx is available at clinic if needed. Will assist as needed.   Olivia Canter Renal Navigator [phone]

## 2023-03-16 NOTE — Procedures (Signed)
Patient seen and examined on Hemodialysis. The procedure was supervised and I have made appropriate changes. BP (!) 102/57   Pulse 82   Temp 97.8 F (36.6 C) (Oral)   Resp 20   Ht 4\' 11"  (1.499 m)   Wt 78.6 kg   SpO2 97%   BMI 35.00 kg/m   QB 300 mL/ min via TDC, UF goal 1L  Tolerating treatment without complaints at this time.   Bufford Buttner MD [facility] [phone] 11:05 AM

## 2023-03-16 NOTE — Progress Notes (Signed)
Palliative Medicine Inpatient Follow Up Note HPI: [name] [name] is a 67 y.o. adult with medical history significant of CKD 5,diabetes, neuropathy, thrombocytopenia, OSA, osteomyelitis, diabetic foot infections, anemia, hypothyroidism, hypertension, hyperlipidemia, CAD, cirrhosis, asthma, spinal stenosis, status post lumbar fusion, status post renal transplant (age 57), PTSD, depression, anxiety, obesity, fibromyalgia, DVT, cataracts presenting with worsening back pain, nausea, vomiting. She presented to the hospital for back pain, abdominal pain, nausea, and vomiting and was admitted on 02/26/23.   Palliative Medicine was consulted for goals of care and pain management.  Today's Discussion 03/16/2023  *Please note that this is a verbal dictation therefore any spelling or grammatical errors are due to the "[employer]agon [employer] One" system interpretation.  Chart reviewed inclusive of vital signs, progress notes, laboratory results, and diagnostic images.   We discussed [name] [name]'s recent fall in the hospital OR and how that has devastated her in terms of her pain management. We reviewed modifying her present regiment for greater relief. I supported repositioning her this later afternoon. She was written for a 1x dose of fentanyl.   Created space and opportunity for patient to explore thoughts feelings and fears regarding current medical situation. She expresses frustration as she was starting to feel better and now she has another set back. I offered her time while she expressed her frustrations.   Patient worried her husband will not be capable of driving her back and forth to dialysis as it will interrupt his work day. We discussed talking to the MSW regarding transportation services.   Questions and concerns addressed/Palliative Support Provided.   Objective Assessment: Vital Signs Vitals:   03/16/23 0657 03/16/23 0721  BP: (!) 105/50 (!) 105/53  Pulse: 82 79  Resp: 15 20  Temp:    SpO2:  94% 92%    Intake/Output Summary (Last 24 hours) at 03/16/2023 8119 Last data filed at 03/16/2023 0447 Gross per 24 hour  Intake --  Output 1250 ml  Net -1250 ml   Last Weight  Most recent update: 03/16/2023  6:45 AM    Weight  78.6 kg (173 lb 4.5 oz)            Gen:  Elderly F in NAD HEENT: dry mucous membranes CV: Regular rate and rhythm PULM: On RA breathing is comfortable and non-labored ABD: soft/nontender  EXT: No edema  Neuro: Alert and oriented x3   SUMMARY OF RECOMMENDATIONS   Full Code / Full scope of care  Symptoms at below:  Chronic Pain Continue tylenol 1000 mg tid Continue diclofenac (Flector) 1.3% patch bid Continue lidocaine patch 5% 2 patches daily Continue Robaxin 750mg  tid Change scheduled dilaudid to 3mg  tab q6hr PRN dilaudid to 1-2mg  tab q4hrs One time dose fentanyl Continue duloxetine (Cymbalta) 60mg  daily Continue pregabalin (Lyrica) 75mg  daily Add Miralax 17g prn Nausea Continue zofran 4mg  IV q6hr prn Anxiety Continue duloxetine (Cymbalta) 60mg  daily Continue pregabalin (Lyrica) 75mg  daily Continue diazepam (Valium) 5 mg q12hr prn Insomnia Monitor - melatonin made her sleepy  Billing based on MDM: High  Problems Addressed: One acute or chronic illness or injury that poses a threat to life or bodily function  Amount and/or Complexity of Data: Category 3:Discussion of management or test interpretation with external physician/other qualified health care professional/appropriate source (not separately reported)  Risks: Parenteral controlled substances and Drug therapy requiring intensive monitoring for toxicity ______________________________________________________________________________________ [name] [name] Palliative Medicine Team Team Cell Phone: [phone] Please utilize secure chat with additional questions, if there is no response  within 30 minutes please call the above phone number  Palliative Medicine  Team providers are available by phone from 7am to 7pm daily and can be reached through the team cell phone.  Should this patient require assistance outside of these hours, please call the patient's attending physician.

## 2023-03-16 NOTE — Plan of Care (Signed)
  Problem: Education: Goal: Understanding of CV disease, CV risk reduction, and recovery process will improve Outcome: Progressing Goal: Individualized Educational Video(s) Outcome: Progressing   Problem: Activity: Goal: Ability to return to baseline activity level will improve Outcome: Progressing   Problem: Cardiovascular: Goal: Ability to achieve and maintain adequate cardiovascular perfusion will improve Outcome: Progressing Goal: Vascular access site(s) Level 0-1 will be maintained Outcome: Progressing   Problem: Health Behavior/Discharge Planning: Goal: Ability to safely manage health-related needs after discharge will improve Outcome: Progressing   Problem: Education: Goal: Ability to describe self-care measures that may prevent or decrease complications (Diabetes Survival Skills Education) will improve Outcome: Progressing Goal: Individualized Educational Video(s) Outcome: Progressing   Problem: Coping: Goal: Ability to adjust to condition or change in health will improve Outcome: Progressing   Problem: Metabolic: Goal: Ability to maintain appropriate glucose levels will improve Outcome: Progressing   Problem: Nutritional: Goal: Maintenance of adequate nutrition will improve Outcome: Progressing Goal: Progress toward achieving an optimal weight will improve Outcome: Progressing   Problem: Health Behavior/Discharge Planning: Goal: Ability to manage health-related needs will improve Outcome: Progressing   Problem: Nutrition: Goal: Adequate nutrition will be maintained Outcome: Progressing   Problem: Coping: Goal: Level of anxiety will decrease Outcome: Progressing   Problem: Elimination: Goal: Will not experience complications related to bowel motility Outcome: Progressing Goal: Will not experience complications related to urinary retention Outcome: Progressing   Problem: Pain Managment: Goal: General experience of comfort will improve Outcome:  Progressing   Problem: Safety: Goal: Ability to remain free from injury will improve Outcome: Progressing   Problem: Skin Integrity: Goal: Risk for impaired skin integrity will decrease Outcome: Progressing   Problem: Education: Goal: Knowledge of disease and its progression will improve Outcome: Progressing Goal: Individualized Educational Video(s) Outcome: Progressing   Problem: Fluid Volume: Goal: Compliance with measures to maintain balanced fluid volume will improve Outcome: Progressing   Problem: Health Behavior/Discharge Planning: Goal: Ability to manage health-related needs will improve Outcome: Progressing   Problem: Nutritional: Goal: Ability to make healthy dietary choices will improve Outcome: Progressing   Problem: Clinical Measurements: Goal: Complications related to the disease process, condition or treatment will be avoided or minimized Outcome: Progressing

## 2023-03-16 NOTE — Progress Notes (Signed)
Received patient in bed to unit.  Alert and oriented.  Informed consent signed and in chart.   TX duration: 3.5hrs  Patient tolerated well.  Transported back to the room. Alert, without acute distress.  Hand-off given to patient's nurse.   Access used: L IJ Access issues: None  Total UF removed: 0 Medication(s) given: None   03/16/23 1105  Vitals  Temp 97.8 F (36.6 C)  Temp Source Oral  BP (!) 114/55  MAP (mmHg) 74  BP Location Right Arm  BP Method Automatic  Patient Position (if appropriate) Lying  Pulse Rate 87  Pulse Rate Source Monitor  ECG Heart Rate 81  Resp 10  Oxygen Therapy  SpO2 98 %  O2 Device Nasal Cannula  Patient Activity (if Appropriate) In bed  Pulse Oximetry Type Continuous  During Treatment Monitoring  Intra-Hemodialysis Comments Tx completed;Tolerated well  Post Treatment  Dialyzer Clearance Lightly streaked  Duration of HD Treatment -hour(s) 3.5 hour(s)  Liters Processed 84  Fluid Removed (mL) 0 mL  Tolerated HD Treatment Yes  Hemodialysis Catheter Left Subclavian Double lumen Permanent (Tunneled)  Placement Date/Time: 03/12/23 1402   Placed prior to admission: No  Serial / Lot #: [id number]  Expiration Date: 08/26/24  Time Out: Correct patient;Correct site;Correct procedure  Maximum sterile barrier precautions: Hand hygiene;Cap;Mask;Sterile...  Site Condition No complications  Blue Lumen Status Dead end cap in place;Heparin locked  Red Lumen Status Dead end cap in place;Heparin locked  Purple Lumen Status N/A  Catheter fill solution Heparin 1000 units/ml  Catheter fill volume (Arterial) 2.1 cc  Catheter fill volume (Venous) 2.1  Dressing Type Transparent  Dressing Status Antimicrobial disc in place;Clean, Dry, Intact  Interventions Other (Comment)  Drainage Description None  Dressing Change Due 03/22/23  Post treatment catheter status Capped and Clamped     Porche Steinberger [facility]

## 2023-03-16 NOTE — Progress Notes (Signed)
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  BMI baseline.  Presented to the hospital with complaints of back pain.  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  4/29 admitted to the hospital.  MRI controllable Looking for vertebral body, new marrow edema T3.  7 mm epidural fluid collection. 5/2 nephrology was consulted for AKI on CKD 5. 5/3 underwent IR guided T5 bone biopsy and T4-T5 disc aspiration.  IR also placed temp HD catheter for HD. 5/12 vascular surgery was consulted. 5/13 left TDC placement with vascular surgery. 5/14 palliative care consulted for pain management 5/15 underwent aVF placement left brachial cephalic. Had a Fall in the OR while being transferred from the OR table. 5/17 Accepted at [facility] SW GBO TTS pending pt's ability to sit for extended period of time. Assessment and Plan: Acute osteomyelitis discitis thoracic spine persistent at T4, T5 Presents with back pain. MRI T spine 4/30 Persistent T4-T5 discitis/osteomyelitis, In comparison to January 28, 2023, mild interval collapse in the T4 vertebral body. Mild new marrow edema at T3 is new and likely represents osteomyelitis. Mild interval increase in size of a suspected dorsal epidural fluid collection measuring 7 mm, possibly abscess or phlegmon. Negative blood culture 4/29 Biopsy results no growth to date from aspiration Per ID-complete IV daptomycin 04/13/2023, OP F/u 04/12/2023  Pain control Chronic anxiety Appreciate palliative care assistance. Prior to admission patient is on Norco 5-325 4 times daily as needed. Has been on Valium as  needed in the past. Also was on Lyrica 75 mg 3 times daily.  Currently tylenol 1000 mg tid, diclofenac (Flector) 1.3% patch bid, lidocaine patch 5% 2 patches daily, Robaxin 750mg  tid, scheduled dilaudid to 2mg  tab q6hr and PRN dilaudid to 1mg  tab q4hrs, duloxetine (Cymbalta) 60mg  daily, pregabalin (Lyrica) 75mg  daily and diazepam (Valium) 5 mg q12hr prn.   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  Serum creatinine peaked at 8 leading to start her on HD.  5/3. TDC placement 5/13. AVF placement 5/15. Will need to be able to tolerate HD in recliner for outpatient HD.   Type 2 diabetes mellitus, uncontrolled with hyperglycemia with nephropathy. 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 Hemoglobin A1c less than 5.   Diarrhea C. difficile antigen positive but toxigenic PCR negative. Currently resolved.  Discontinue Questran. Will monitor.   Urinary Retention. Foley Catheter Placed on 5/2, removed on 5/9, required replacement on 5/10. No prior history of urinary retention.  Unable to tolerate PureWick due to allergic reaction to some substance in PureWick. Discontinue when mobility improves.   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  Monitor.   Normocytic anemia Iron studies 5/3 did not show any deficiency and saturations are good Management per nephrology.   Chronic HFpEF EF 65-70%.  Appears euvolemic. Volume management with HD.   HTN Monitor for now. Blood pressure soft.   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 Patient had a fall in  the OR while being transferred from the OR table to the stretcher. CT chest abdomen pelvis shows no evidence of acute trauma. CT head negative as  well. X-ray shoulder of the left arm negative for any acute fracture. Currently has a sling for pain control.   Subjective: No nausea no vomiting.  Reports no BM so far.  Passing gas.  Tolerating oral diet.  Physical Exam: General: in Mild distress, No Rash Cardiovascular: S1 and S2 Present, No Murmur Respiratory: Good respiratory effort, Bilateral Air entry present. No Crackles, No wheezes Abdomen: Bowel Sound present, No tenderness Extremities: No edema Neuro: Alert and oriented x3, no new focal deficit  Data Reviewed: I have Reviewed nursing notes, Vitals, and Lab results. Since last encounter, pertinent lab results CBC and BMP   . I have ordered test including CBC and BMP  .   Disposition: Status is: Inpatient Remains inpatient appropriate because: Awaiting for improvement in pain control so the patient can tolerate recliner for HD.  apixaban (ELIQUIS) tablet 2.5 mg Start: 03/15/23 1145 Place and maintain sequential compression device Start: 03/01/23 1536 apixaban (ELIQUIS) tablet 2.5 mg   Family Communication: No one at bedside Level of care: Med-Surg   Vitals:   03/16/23 1022 03/16/23 1052 03/16/23 1105 03/16/23 1112  BP: (!) 102/57 (!) 111/55 (!) 114/55 (!) 123/53  Pulse: 82 82 87 84  Resp: 20 19 10 17   Temp:   97.8 F (36.6 C)   TempSrc:   Oral   SpO2: 97% 97% 98% 100%  Weight:   78.6 kg   Height:         Author: Lynden Oxford, MD 03/16/2023 12:06 PM  Please look on [email or web address] to find out who is on call.

## 2023-03-16 NOTE — Progress Notes (Signed)
PT Cancellation Note  Patient Details Name: [name] [name] MRN: [id number] DOB: 1956-09-16   Cancelled Treatment:    Reason Eval/Treat Not Completed: Patient at procedure or test/unavailable.  Gone to HD, retry as time and pt allow.   Ivar Drape 03/16/2023, 10:11 AM  Samul Dada, PT PhD Acute Rehab Dept. Number: [facility] [id number] and [facility] [phone]

## 2023-03-16 NOTE — Progress Notes (Signed)
[facility] Progress Note   Assessment/ Plan:   New ESRD: Baseline creatinine was around 4.5 prior to admission.  AKI likely ATN.  Did require dialysis earlier in the hospitalization.  Had some improvement in kidney function as well as urine output and dialysis catheter was removed.  Now with persistent low urine output and rising creatinine most likely ESRD at this point  -[facility] placement 03/12/23, appreciate assistance -Vascular surgery following, appreciate help.   -CLIP in process - HD 03/14/23 after AVF -Continue to monitor daily Cr, Dose meds for GFR -Monitor Daily I/Os, Daily weight  -Maintain MAP>65 for optimal renal perfusion.  -Avoid nephrotoxic medications including NSAIDs -Use synthetic opioids (Fentanyl/Dilaudid) if needed - HD 5/17-   Anemia: Likely multifactorial with inflammation and CKD contributing.  Increased ESA to 100 mcg weekly.  Iron replete.   HFpEF: Volume status overall acceptable.  Continue to monitor   Osteomyelitis: On daptomycin.  Per primary team   DM2: Management per primary   CKD BMD: phos acceptable. Ca corrects near normal. F/u PTH    Subjective:    Seen on dialysis- doing OK.  Having HD in the bed   Objective:   BP (!) 102/57   Pulse 82   Temp 97.8 F (36.6 C) (Oral)   Resp 20   Ht 4\' 11"  (1.499 m)   Wt 78.6 kg   SpO2 97%   BMI 35.00 kg/m   Physical Exam: Gen:NAD CVS: RRR Resp: clear Abd: soft Ext: no  LE edema ACCESS: L IJ TDC, L AVF + T/B  Labs: BMET Recent Labs  Lab 03/10/23 0029 03/11/23 0309 03/12/23 0500 03/13/23 0949 03/14/23 0355 03/14/23 1341 03/15/23 0029 03/16/23 0326  NA 134* 132* 137 136 137 140 134* 131*  K 4.6 4.1 5.0 4.0 4.2 5.1 3.9 3.9  CL 102 101 104 102 103 103 97* 95*  CO2 23 22 23 25 25   --  29 27  GLUCOSE 86 105* 110* 79 108* 95 82 83  BUN 44* 49* 50* 28* 40* 39* 16 29*  CREATININE 5.03* 4.96* 4.54* 3.06* 3.84* 4.60* 2.06* 3.53*  CALCIUM 7.9* 7.6* 7.8* 7.9* 7.6*  --  8.2* 8.5*   PHOS 4.5 5.0* 4.6 4.1 3.9  --  2.6 3.5   CBC Recent Labs  Lab 03/12/23 2255 03/14/23 1341 03/14/23 2113 03/16/23 0650  WBC 4.8  --  4.3 4.2  HGB 7.1* 8.8* 7.2* 7.1*  HCT 24.5* 26.0* 24.7* 23.8*  MCV 96.1  --  93.9 93.3  PLT 123*  --  115* 116*      Medications:     sodium chloride   Intravenous Once   acetaminophen  1,000 mg Oral TID   acidophilus  2 capsule Oral TID   apixaban  2.5 mg Oral BID   calcium carbonate  1 tablet Oral Daily   Chlorhexidine Gluconate Cloth  6 each Topical Q0600   cholestyramine  4 g Oral BID   darbepoetin (ARANESP) injection - NON-DIALYSIS  100 mcg Subcutaneous Q Fri-1800   diclofenac  1 patch Transdermal BID   DULoxetine  60 mg Oral Daily   feeding supplement  237 mL Oral BID BM   fentaNYL (SUBLIMAZE) injection  50 mcg Intravenous Once   furosemide  40 mg Intravenous Q12H   HYDROmorphone  2 mg Oral Q6H   lidocaine  2 patch Transdermal Q24H   methocarbamol  750 mg Oral TID   mometasone-formoterol  2 puff Inhalation BID   pregabalin  75  mg Oral Daily   sodium chloride flush  10-40 mL Intracatheter Q12H   sodium chloride flush  3 mL Intravenous Q12H     Bufford Buttner MD 03/16/2023, 11:04 AM

## 2023-03-17 DIAGNOSIS — Z515 Encounter for palliative care: Secondary | ICD-10-CM | POA: Diagnosis not present

## 2023-03-17 DIAGNOSIS — M4624 Osteomyelitis of vertebra, thoracic region: Secondary | ICD-10-CM | POA: Diagnosis not present

## 2023-03-17 LAB — BPAM RBC
Blood Product Expiration Date: 202405252359
Blood Product Expiration Date: 202406022359
Unit Type and Rh: 600

## 2023-03-17 LAB — TYPE AND SCREEN
ABO/RH(D): A NEG
Antibody Screen: NEGATIVE
Unit division: 0

## 2023-03-17 LAB — CBC
HCT: 22.6 % — ABNORMAL LOW (ref 36.0–46.0)
HCT: 28.3 % — ABNORMAL LOW (ref 36.0–46.0)
Hemoglobin: 6.8 g/dL — CL (ref 12.0–15.0)
Hemoglobin: 8.4 g/dL — ABNORMAL LOW (ref 12.0–15.0)
MCH: 28.1 pg (ref 26.0–34.0)
MCH: 28.2 pg (ref 26.0–34.0)
MCHC: 30.1 g/dL (ref 30.0–36.0)
MCHC: 29.7 g/dL — ABNORMAL LOW (ref 30.0–36.0)
MCV: 93.4 fL (ref 80.0–100.0)
MCV: 95 fL (ref 80.0–100.0)
Platelets: 108 10*3/uL — ABNORMAL LOW (ref 150–400)
Platelets: 109 10*3/uL — ABNORMAL LOW (ref 150–400)
RBC: 2.42 MIL/uL — ABNORMAL LOW (ref 3.87–5.11)
RBC: 2.98 MIL/uL — ABNORMAL LOW (ref 3.87–5.11)
RDW: 16.8 % — ABNORMAL HIGH (ref 11.5–15.5)
RDW: 16.7 % — ABNORMAL HIGH (ref 11.5–15.5)
WBC: 4 10*3/uL (ref 4.0–10.5)
WBC: 4.4 10*3/uL (ref 4.0–10.5)
nRBC: 0 % (ref 0.0–0.2)
nRBC: 0 % (ref 0.0–0.2)

## 2023-03-17 LAB — RENAL FUNCTION PANEL
Albumin: 2.4 g/dL — ABNORMAL LOW (ref 3.5–5.0)
Anion gap: 7 (ref 5–15)
BUN: 15 mg/dL (ref 8–23)
CO2: 30 mmol/L (ref 22–32)
Calcium: 8.3 mg/dL — ABNORMAL LOW (ref 8.9–10.3)
Chloride: 98 mmol/L (ref 98–111)
Creatinine, Ser: 2.39 mg/dL — ABNORMAL HIGH (ref 0.44–1.00)
GFR, Estimated: 22 mL/min — ABNORMAL LOW (ref >60)
Glucose, Bld: 82 mg/dL (ref 70–99)
Phosphorus: 2.6 mg/dL (ref 2.5–4.6)
Potassium: 3.9 mmol/L (ref 3.5–5.1)
Sodium: 135 mmol/L (ref 135–145)

## 2023-03-17 LAB — GLUCOSE, CAPILLARY
Glucose-Capillary: 80 mg/dL (ref 70–99)
Glucose-Capillary: 89 mg/dL (ref 70–99)
Glucose-Capillary: 102 mg/dL — ABNORMAL HIGH (ref 70–99)
Glucose-Capillary: 99 mg/dL (ref 70–99)

## 2023-03-17 LAB — PREPARE RBC (CROSSMATCH)

## 2023-03-17 MED ORDER — OXYCODONE HCL 5 MG PO TABS
10.0000 mg | ORAL_TABLET | ORAL | Status: DC | PRN
  Administered 2023-03-17 – 2023-03-19 (×4): 10 mg via ORAL
  Filled 2023-03-17 (×5): qty 2

## 2023-03-17 MED ORDER — HYDROMORPHONE HCL 2 MG PO TABS: 4.0000 mg | ORAL_TABLET | Freq: Four times a day (QID) | ORAL | Status: DC

## 2023-03-17 MED ORDER — NEPRO/CARBSTEADY PO LIQD
237.0000 mL | Freq: Three times a day (TID) | ORAL | Status: DC
  Administered 2023-03-17 – 2023-04-03 (×26): 237 mL via ORAL

## 2023-03-17 MED ORDER — SODIUM CHLORIDE 0.9% IV SOLUTION: Freq: Once | INTRAVENOUS | Status: DC

## 2023-03-17 MED ORDER — SENNOSIDES-DOCUSATE SODIUM 8.6-50 MG PO TABS: 2.0000 | ORAL_TABLET | Freq: Two times a day (BID) | ORAL | Status: DC

## 2023-03-17 MED ORDER — OXYCODONE HCL ER 10 MG PO T12A
10.0000 mg | EXTENDED_RELEASE_TABLET | Freq: Two times a day (BID) | ORAL | Status: DC
  Administered 2023-03-17 – 2023-03-18 (×3): 10 mg via ORAL
  Filled 2023-03-17 (×3): qty 1

## 2023-03-17 MED ORDER — SENNOSIDES-DOCUSATE SODIUM 8.6-50 MG PO TABS
2.0000 | ORAL_TABLET | Freq: Two times a day (BID) | ORAL | Status: DC
  Administered 2023-03-17 – 2023-03-18 (×3): 2 via ORAL
  Filled 2023-03-17 (×3): qty 2

## 2023-03-17 MED ORDER — RISAQUAD PO CAPS
2.0000 | ORAL_CAPSULE | Freq: Three times a day (TID) | ORAL | Status: DC
  Administered 2023-03-17 – 2023-04-03 (×39): 2 via ORAL
  Filled 2023-03-17 (×48): qty 2

## 2023-03-17 MED ORDER — FENTANYL CITRATE PF 50 MCG/ML IJ SOSY
25.0000 ug | PREFILLED_SYRINGE | INTRAMUSCULAR | Status: DC | PRN
  Administered 2023-03-17 – 2023-03-19 (×6): 25 ug via INTRAVENOUS
  Filled 2023-03-17 (×5): qty 1

## 2023-03-17 MED ORDER — OXYCODONE HCL 5 MG PO TABS: 5.0000 mg | ORAL_TABLET | ORAL | Status: DC | PRN

## 2023-03-17 NOTE — Progress Notes (Signed)
[facility] Progress Note   Assessment/ Plan:   New ESRD: Baseline creatinine was around 4.5 prior to admission.  AKI likely ATN.  Did require dialysis earlier in the hospitalization.  Had some improvement in kidney function as well as urine output and dialysis catheter was removed.  Now with persistent low urine output and rising creatinine most likely ESRD at this point  -[facility] placement 03/12/23, appreciate assistance -Vascular surgery following, appreciate help.   -CLIP in process - HD 03/14/23 after AVF -Continue to monitor daily Cr, Dose meds for GFR -Monitor Daily I/Os, Daily weight  -Maintain MAP>65 for optimal renal perfusion.  -Avoid nephrotoxic medications including NSAIDs -Use synthetic opioids (Fentanyl/Dilaudid) if needed - HD 5/17, in the bed - ideally would come to HD Monday in the chair- will try   Anemia: Likely multifactorial with inflammation and CKD contributing.  Increased ESA to 100 mcg weekly.  Iron replete.   HFpEF: Volume status overall acceptable.  Continue to monitor   Osteomyelitis: On daptomycin.  Per primary team   DM2: Management per primary   CKD BMD: phos acceptable. Ca corrects near normal. F/u PTH    Subjective:    NO complaints.  Had HD yesterday and did well.  Asking about the fluid restriction- we discussed that she can have ice as long as she counts it to the fluid restriction.   Objective:   BP (!) 153/62 (BP Location: Left Arm)   Pulse 74   Temp 99.1 F (37.3 C) (Oral)   Resp 16   Ht 4\' 11"  (1.499 m)   Wt 38 kg   SpO2 92%   BMI 16.92 kg/m   Physical Exam: Gen:NAD CVS: RRR Resp: clear Abd: soft Ext: no  LE edema ACCESS: L IJ TDC, L AVF + T/B  Labs: BMET Recent Labs  Lab 03/11/23 0309 03/12/23 0500 03/13/23 0949 03/14/23 0355 03/14/23 1341 03/15/23 0029 03/16/23 0326 03/17/23 0420  NA 132* 137 136 137 140 134* 131* 135  K 4.1 5.0 4.0 4.2 5.1 3.9 3.9 3.9  CL 101 104 102 103 103 97* 95* 98  CO2 22 23  25 25   --  29 27 30   GLUCOSE 105* 110* 79 108* 95 82 83 82  BUN 49* 50* 28* 40* 39* 16 29* 15  CREATININE 4.96* 4.54* 3.06* 3.84* 4.60* 2.06* 3.53* 2.39*  CALCIUM 7.6* 7.8* 7.9* 7.6*  --  8.2* 8.5* 8.3*  PHOS 5.0* 4.6 4.1 3.9  --  2.6 3.5 2.6   CBC Recent Labs  Lab 03/14/23 2113 03/16/23 0650 03/16/23 1840 03/17/23 0420  WBC 4.3 4.2 3.9* 4.0  HGB 7.2* 7.1* 7.0* 6.8*  HCT 24.7* 23.8* 23.6* 22.6*  MCV 93.9 93.3 94.0 93.4  PLT 115* 116* 117* 108*      Medications:     sodium chloride   Intravenous Once   sodium chloride   Intravenous Once   acetaminophen  1,000 mg Oral TID   acidophilus  2 capsule Oral TID   apixaban  2.5 mg Oral BID   calcium carbonate  1 tablet Oral Daily   Chlorhexidine Gluconate Cloth  6 each Topical Q0600   darbepoetin (ARANESP) injection - NON-DIALYSIS  100 mcg Subcutaneous Q Fri-1800   diclofenac  1 patch Transdermal BID   DULoxetine  60 mg Oral Daily   fentaNYL (SUBLIMAZE) injection  50 mcg Intravenous Once   furosemide  40 mg Intravenous Q12H   lidocaine  2 patch Transdermal Q24H   methocarbamol  750  mg Oral TID   mometasone-formoterol  2 puff Inhalation BID   oxyCODONE  10 mg Oral Q12H   pregabalin  75 mg Oral Daily   senna-docusate  2 tablet Oral BID   sodium chloride flush  10-40 mL Intracatheter Q12H   sodium chloride flush  3 mL Intravenous Q12H     Bufford Buttner MD 03/17/2023, 8:41 AM

## 2023-03-17 NOTE — Progress Notes (Signed)
   03/17/23 0549  Provider Notification  Provider Name/Title A Chavex,NP  Date Provider Notified 03/17/23  Time Provider Notified [phone]  Method of Notification Page  Notification Reason Other (Comment);Critical Result  Test performed and critical result Hemoglobin  Date Critical Result Received 03/17/23  Time Critical Result Received 0518  Provider response Other (Comment) (Nephrology will follow)  Date of Provider Response 03/17/23  Time of Provider Response [phone]

## 2023-03-17 NOTE — Progress Notes (Signed)
[facility] Progress Note Patient: [name] [name] ZOX:[id number] DOB: [birth year] DOA: 02/26/2023  DOS: the patient was seen and examined on 03/17/2023  Brief hospital course: PMH of type II DM, neuropathy, thrombocytopenia, OSA, osteomyelitis, hypothyroidism, HTN, CAD, cirrhosis, ESRD now on HD, spinal stenosis SP lumbar fusion, history of renal transplant, PTSD, depression, obesity, fibromyalgia, low-dose metoprolol doses.  BMI baseline.  Presented to the hospital with complaints of back pain.  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  4/29 admitted to the hospital.  MRI controllable Looking for vertebral body, new marrow edema T3.  7 mm epidural fluid collection. 5/2 nephrology was consulted for AKI on CKD 5. 5/3 underwent IR guided T5 bone biopsy and T4-T5 disc aspiration.  IR also placed temp HD catheter for HD. 5/12 vascular surgery was consulted. 5/13 left TDC placement with vascular surgery. 5/14 palliative care consulted for pain management 5/15 underwent aVF placement left brachial cephalic. Had a Fall in the OR while being transferred from the OR table. 5/17 Accepted at [facility] SW GBO TTS pending pt's ability to sit for extended period of time. 5/18 1 PRBC transfusion ordered Assessment and Plan: Acute osteomyelitis discitis thoracic spine persistent at T4, T5 Presents with back pain. MRI T spine 4/30 Persistent T4-T5 discitis/osteomyelitis, In comparison to January 28, 2023, mild interval collapse in the T4 vertebral body. Mild new marrow edema at T3 is new and likely represents osteomyelitis. Mild interval increase in size of a suspected dorsal epidural fluid collection measuring 7 mm, possibly abscess or phlegmon. Negative blood culture 4/29 Biopsy results no growth to date from aspiration Per ID-complete IV daptomycin 04/13/2023, OP F/u 04/12/2023  Pain control Chronic anxiety Appreciate palliative care assistance. Prior to admission patient is on Norco 5-325 4 times daily as  needed. Has been on Valium as needed in the past. Also was on Lyrica 75 mg 3 times daily.  Currently tylenol 1000 mg tid, diclofenac (Flector) 1.3% patch bid, lidocaine patch 5% 2 patches daily, Robaxin 750mg  tid, scheduled dilaudid to 2mg  tab q6hr and PRN dilaudid to 1mg  tab q4hrs, duloxetine (Cymbalta) 60mg  daily, pregabalin (Lyrica) 75mg  daily and diazepam (Valium) 5 mg q12hr prn.   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  Serum creatinine peaked at 8 leading to start her on HD.  5/3. TDC placement 5/13. AVF placement 5/15. Will need to be able to tolerate HD in recliner for outpatient HD.   Type 2 diabetes mellitus, uncontrolled with hyperglycemia with nephropathy. 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 Hemoglobin A1c less than 5.   Diarrhea C. difficile antigen positive but toxigenic PCR negative. Currently resolved.  Discontinue Questran. Now started on bowel regimen   Urinary Retention. Foley Catheter Placed on 5/2, removed on 5/9, required replacement on 5/10. No prior history of urinary retention.  Unable to tolerate PureWick due to allergic reaction to some substance in PureWick. Discontinue tomorrow.   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  Monitor.   Normocytic anemia Iron studies 5/3 did not show any deficiency and saturations are good Hemoglobin dropped down to 6.8 on 5/18.  Ordered 1 PRBC transfusion.  No bleeding noted.  Will recheck CBC tomorrow.  Already receiving IV iron as well as EPO support.   Chronic HFpEF EF 65-70%.  Appears euvolemic. Volume management with HD.   HTN Monitor for now. Blood pressure soft.   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 Patient had a fall in the OR while being transferred from the OR table to the stretcher. CT chest abdomen pelvis shows no evidence of acute trauma. CT head negative as well. X-ray shoulder of the left arm negative for any acute fracture. Currently has a sling for pain control.   Subjective: Still no bowel movement.  Passing gas.  Tolerating oral diet although minimal oral intake  Physical Exam: In mild distress. No rash. No edema. Clear to auscultation. Bowel sound present.  Mildly distended but nontender.  Data Reviewed: I have Reviewed nursing notes, Vitals, and Lab results. Since last encounter, pertinent lab results CBC and BMP   . I have ordered test including CBC and BMP  .   Disposition: Status is: Inpatient Remains inpatient appropriate because: Awaiting ability to sit in the recliner for HD.  apixaban (ELIQUIS) tablet 2.5 mg Start: 03/15/23 1145 Place and maintain sequential compression device Start: 03/01/23 1536 apixaban (ELIQUIS) tablet 2.5 mg   Family Communication: No one at bedside Level of care: Med-Surg   Vitals:   03/17/23 0749 03/17/23 1151 03/17/23 1211 03/17/23 1530  BP: (!) 153/62 (!) 118/51 (!) 139/58 (!) 140/57  Pulse: 74 69 72 73  Resp: 16 16 12 14   Temp: 99.1 F (37.3 C) 98 F (36.7 C) 97.9 F (36.6 C) 97.8 F (36.6 C)  TempSrc: Oral Oral Oral Oral  SpO2: 92% 96% 90% 96%  Weight:      Height:         Author: Lynden Oxford, MD 03/17/2023 5:04 PM  Please look on [email or web address] to find out who is on call.

## 2023-03-17 NOTE — Progress Notes (Addendum)
Palliative Medicine Inpatient Follow Up Note HPI: [name] [name] is a 67 y.o. adult with medical history significant of CKD 5,diabetes, neuropathy, thrombocytopenia, OSA, osteomyelitis, diabetic foot infections, anemia, hypothyroidism, hypertension, hyperlipidemia, CAD, cirrhosis, asthma, spinal stenosis, status post lumbar fusion, status post renal transplant (age 6), PTSD, depression, anxiety, obesity, fibromyalgia, DVT, cataracts presenting with worsening back pain, nausea, vomiting. She presented to the hospital for back pain, abdominal pain, nausea, and vomiting and was admitted on 02/26/23.   Palliative Medicine was consulted for goals of care and pain management.  Today's Discussion 03/17/2023  *Please note that this is a verbal dictation therefore any spelling or grammatical errors are due to the "[employer]" system interpretation.  Chart reviewed inclusive of vital signs, progress notes, laboratory results, and diagnostic images.   I met with Brightyn's RN who shares that her pain has not been well controlled.   I met with Tyara and her spouse, [name] at bedside. We discussed that she is having pain in her back which remains to be unrelieved. We reviewed changing her pain medication regiment which she was very much in favor of. We reviewed that when she is stable enough to discharge she will need to be set up at a chronic pain management clinic. We also reviewed the need for transportation arrangements to and from dialysis.   We reviewed the goals at this time which are geared towards improvement, Patient would like to maintain as a full code/full scope of care.   Questions and concerns addressed/Palliative Support Provided.   Objective Assessment: Vital Signs Vitals:   03/17/23 1151 03/17/23 1211  BP: (!) 118/51 (!) 139/58  Pulse: 69 72  Resp: 16 12  Temp: 98 F (36.7 C) 97.9 F (36.6 C)  SpO2: 96% 90%    Intake/Output Summary (Last 24 hours) at 03/17/2023  1314 Last data filed at 03/17/2023 1610 Gross per 24 hour  Intake 240 ml  Output 1550 ml  Net -1310 ml    Last Weight  Most recent update: 03/17/2023  5:07 AM    Weight  38 kg (83 lb 12.4 oz)            Gen:  Elderly F in NAD HEENT: dry mucous membranes CV: Regular rate and rhythm PULM: On RA breathing is comfortable and non-labored ABD: soft/nontender  EXT: No edema  Neuro: Alert and oriented x3   SUMMARY OF RECOMMENDATIONS   Full Code / Full scope of care  Symptoms at below:  Chronic Pain Continue tylenol 1000 mg tid Continue diclofenac (Flector) 1.3% patch bid Continue lidocaine patch 5% 2 patches daily Continue Robaxin 750mg  tid Discontinue dilaudid Oxycontin 10mg  PO BID Oxycodone 5-10mg  PO Q4H PRN moderate/severe pain Fentanyl 25mg  IVP Q3H PRN breakthrough pain Continue duloxetine (Cymbalta) 60mg  daily Continue pregabalin (Lyrica) 75mg  daily Kpad to back as tolerated Will need a referral to [facility] on discharge - Dr. Laban Emperor  Nausea Continue zofran 4mg  IV q6hr prn  Anxiety Continue duloxetine (Cymbalta) 60mg  daily Continue pregabalin (Lyrica) 75mg  daily Continue diazepam (Valium) 5 mg q12hr prn  Constipation: Senna 2 Tabs PO BID x2 days   Insomnia Monitor - melatonin made her sleepy  Billing based on MDM: High  Problems Addressed: One acute or chronic illness or injury that poses a threat to life or bodily function  Amount and/or Complexity of Data: Category 3:Discussion of management or test interpretation with external physician/other qualified health care professional/appropriate source (not separately reported)  Risks: Parenteral controlled  substances and Drug therapy requiring intensive monitoring for toxicity ______________________________________________________________________________________ Lamarr Lulas [facility] Palliative Medicine Team Team Cell Phone: [phone] Please utilize secure chat  with additional questions, if there is no response within 30 minutes please call the above phone number  Palliative Medicine Team providers are available by phone from 7am to 7pm daily and can be reached through the team cell phone.  Should this patient require assistance outside of these hours, please call the patient's attending physician.

## 2023-03-18 DIAGNOSIS — R52 Pain, unspecified: Secondary | ICD-10-CM | POA: Diagnosis not present

## 2023-03-18 DIAGNOSIS — Z515 Encounter for palliative care: Secondary | ICD-10-CM | POA: Diagnosis not present

## 2023-03-18 DIAGNOSIS — M4624 Osteomyelitis of vertebra, thoracic region: Secondary | ICD-10-CM | POA: Diagnosis not present

## 2023-03-18 DIAGNOSIS — Z7189 Other specified counseling: Secondary | ICD-10-CM

## 2023-03-18 LAB — TYPE AND SCREEN
ABO/RH(D): A NEG
Antibody Screen: NEGATIVE
Unit division: 0

## 2023-03-18 LAB — GLUCOSE, CAPILLARY
Glucose-Capillary: 77 mg/dL (ref 70–99)
Glucose-Capillary: 93 mg/dL (ref 70–99)
Glucose-Capillary: 145 mg/dL — ABNORMAL HIGH (ref 70–99)
Glucose-Capillary: 162 mg/dL — ABNORMAL HIGH (ref 70–99)

## 2023-03-18 LAB — BPAM RBC
ISSUE DATE / TIME: 202405181137
Unit Type and Rh: 600

## 2023-03-18 LAB — RENAL FUNCTION PANEL
Albumin: 2.5 g/dL — ABNORMAL LOW (ref 3.5–5.0)
Anion gap: 9 (ref 5–15)
BUN: 26 mg/dL — ABNORMAL HIGH (ref 8–23)
CO2: 27 mmol/L (ref 22–32)
Calcium: 8.2 mg/dL — ABNORMAL LOW (ref 8.9–10.3)
Chloride: 97 mmol/L — ABNORMAL LOW (ref 98–111)
Creatinine, Ser: 3.39 mg/dL — ABNORMAL HIGH (ref 0.44–1.00)
GFR, Estimated: 14 mL/min — ABNORMAL LOW (ref >60)
Glucose, Bld: 78 mg/dL (ref 70–99)
Phosphorus: 3.8 mg/dL (ref 2.5–4.6)
Potassium: 4 mmol/L (ref 3.5–5.1)
Sodium: 133 mmol/L — ABNORMAL LOW (ref 135–145)

## 2023-03-18 LAB — CBC
HCT: 26.7 % — ABNORMAL LOW (ref 36.0–46.0)
Hemoglobin: 8.2 g/dL — ABNORMAL LOW (ref 12.0–15.0)
MCH: 29.2 pg (ref 26.0–34.0)
MCHC: 30.7 g/dL (ref 30.0–36.0)
MCV: 95 fL (ref 80.0–100.0)
Platelets: 115 10*3/uL — ABNORMAL LOW (ref 150–400)
RBC: 2.81 MIL/uL — ABNORMAL LOW (ref 3.87–5.11)
RDW: 16.7 % — ABNORMAL HIGH (ref 11.5–15.5)
WBC: 4.3 10*3/uL (ref 4.0–10.5)
nRBC: 0 % (ref 0.0–0.2)

## 2023-03-18 MED ORDER — OXYCODONE HCL ER 15 MG PO T12A
15.0000 mg | EXTENDED_RELEASE_TABLET | Freq: Two times a day (BID) | ORAL | Status: DC
  Administered 2023-03-18 – 2023-03-29 (×22): 15 mg via ORAL
  Filled 2023-03-18 (×22): qty 1

## 2023-03-18 MED ORDER — SENNOSIDES-DOCUSATE SODIUM 8.6-50 MG PO TABS
2.0000 | ORAL_TABLET | Freq: Two times a day (BID) | ORAL | Status: DC
  Administered 2023-03-18 – 2023-03-19 (×2): 2 via ORAL
  Filled 2023-03-18 (×4): qty 2

## 2023-03-18 MED ORDER — BISACODYL 10 MG RE SUPP
10.0000 mg | Freq: Once | RECTAL | Status: AC
  Administered 2023-03-18: 10 mg via RECTAL
  Filled 2023-03-18: qty 1

## 2023-03-18 NOTE — Progress Notes (Signed)
[facility] Progress Note   Assessment/ Plan:   New ESRD: Baseline creatinine was around 4.5 prior to admission.  AKI likely ATN.  Did require dialysis earlier in the hospitalization.  Had some improvement in kidney function as well as urine output and dialysis catheter was removed.  Now with persistent low urine output and rising creatinine most likely ESRD at this point  -[facility] placement 03/12/23, appreciate assistance -Vascular surgery following, appreciate help.   -CLIP in process - HD 03/14/23 after AVF -Continue to monitor daily Cr, Dose meds for GFR -Monitor Daily I/Os, Daily weight  -Maintain MAP>65 for optimal renal perfusion.  -Avoid nephrotoxic medications including NSAIDs -Use synthetic opioids (Fentanyl/Dilaudid) if needed - HD 5/17, in the bed - ideally would come to HD Monday in the chair- will try- pt is in agreement   Anemia: Likely multifactorial with inflammation and CKD contributing.  Increased ESA to 100 mcg weekly.  Iron replete.   HFpEF: Volume status overall acceptable.  Continue to monitor   Osteomyelitis: On daptomycin.  Per primary team   DM2: Management per primary   CKD BMD: phos acceptable. Ca corrects near normal. F/u PTH    Subjective:    Seen in room- uncomfortable this AM- getting pain meds   Objective:   BP (!) 178/68 (BP Location: Right Arm)   Pulse 76   Temp 98.8 F (37.1 C) (Oral)   Resp 19   Ht 4\' 11"  (1.499 m)   Wt 78.1 kg   SpO2 95%   BMI 34.78 kg/m   Physical Exam: Gen:NAD CVS: RRR Resp: clear Abd: soft Ext: no  LE edema ACCESS: L IJ TDC, L AVF + T/B  Labs: BMET Recent Labs  Lab 03/12/23 0500 03/13/23 0949 03/14/23 0355 03/14/23 1341 03/15/23 0029 03/16/23 0326 03/17/23 0420 03/18/23 0421  NA 137 136 137 140 134* 131* 135 133*  K 5.0 4.0 4.2 5.1 3.9 3.9 3.9 4.0  CL 104 102 103 103 97* 95* 98 97*  CO2 23 25 25   --  29 27 30 27   GLUCOSE 110* 79 108* 95 82 83 82 78  BUN 50* 28* 40* 39* 16 29* 15  26*  CREATININE 4.54* 3.06* 3.84* 4.60* 2.06* 3.53* 2.39* 3.39*  CALCIUM 7.8* 7.9* 7.6*  --  8.2* 8.5* 8.3* 8.2*  PHOS 4.6 4.1 3.9  --  2.6 3.5 2.6 3.8   CBC Recent Labs  Lab 03/16/23 1840 03/17/23 0420 03/17/23 1700 03/18/23 0421  WBC 3.9* 4.0 4.4 4.3  HGB 7.0* 6.8* 8.4* 8.2*  HCT 23.6* 22.6* 28.3* 26.7*  MCV 94.0 93.4 95.0 95.0  PLT 117* 108* 109* 115*      Medications:     acetaminophen  1,000 mg Oral TID   acidophilus  2 capsule Oral TID   apixaban  2.5 mg Oral BID   calcium carbonate  1 tablet Oral Daily   Chlorhexidine Gluconate Cloth  6 each Topical Q0600   darbepoetin (ARANESP) injection - NON-DIALYSIS  100 mcg Subcutaneous Q Fri-1800   diclofenac  1 patch Transdermal BID   DULoxetine  60 mg Oral Daily   feeding supplement (NEPRO CARB STEADY)  237 mL Oral TID BM   furosemide  40 mg Intravenous Q12H   lidocaine  2 patch Transdermal Q24H   methocarbamol  750 mg Oral TID   mometasone-formoterol  2 puff Inhalation BID   oxyCODONE  10 mg Oral Q12H   pregabalin  75 mg Oral Daily   senna-docusate  2  tablet Oral BID   sodium chloride flush  10-40 mL Intracatheter Q12H   sodium chloride flush  3 mL Intravenous Q12H     Bufford Buttner MD 03/18/2023, 1:11 PM

## 2023-03-18 NOTE — Progress Notes (Signed)
Palliative Medicine Inpatient Follow Up Note HPI: [name] [name] is a 67 y.o. adult with medical history significant of CKD 5,diabetes, neuropathy, thrombocytopenia, OSA, osteomyelitis, diabetic foot infections, anemia, hypothyroidism, hypertension, hyperlipidemia, CAD, cirrhosis, asthma, spinal stenosis, status post lumbar fusion, status post renal transplant (age 54), PTSD, depression, anxiety, obesity, fibromyalgia, DVT, cataracts presenting with worsening back pain, nausea, vomiting. She presented to the hospital for back pain, abdominal pain, nausea, and vomiting and was admitted on 02/26/23.   Palliative Medicine was consulted for goals of care and pain management.  Today's Discussion 03/18/2023  *Please note that this is a verbal dictation therefore any spelling or grammatical errors are due to the "[employer] One" system interpretation.  Chart reviewed inclusive of vital signs, progress notes, laboratory results, and diagnostic images.   I met with [name] [name] this morning with her husband, [name] [name]. She was noted to be writhing in pain at that time. She shares that she has severe pain on the L side from the fall off the OR table. I assessed her and noted generalized back pain that was radiating to her BLE. We discussed providing a low dose medication to alleviate the pain. She shares that the fentanyl overnight did allow her to sleep more comfortably.   We reviewed that [name] [name] has still not had a bowel movement. She feels though that one is "getting ready to come". She is willing to accept a suppository if it will help the process though is apprehensive to do this presently. We reviewed that we can hold off on it as she has started passing more consistent gas.   We discussed the importance of mobility as she needs to start moving more frequently which may help her discomfort overall.   As of this morning [name] [name] hopes to get her pain under control and is willing to speak after she feels  better. _________________________________  Addendum:  I met with [name] [name] this  evening. She shares that she has had better pain relief today. She expresses that she is having more pronounced discomfort as a result of the trauma endured to her left side. She does feel that the OxyContin offers more consistent pain relief.   [name] [name] is able to get OOB to the chair with me this evening. She tolerates the movement well and actually is able to do all of the work on her own with SBA. We reviewed the plan from the perspective of her pain management.  [name] [name] shares she has been told the catheter will be removed tomorrow morning which makes her anxious for her dialysis as she is going to attempt to sit in the chair for four hours. We discussed alternative modalities to address her fears. We reviewed that she does not need a urine infection on top of all the other things she is going through which she realizes.   We discussed the plan for OP iHD and hopefully clarity with the MSW tomorrow in terms of rides. I shared I would reach out to the renal navigator.   Questions and concerns addressed/Palliative Support Provided.   Objective Assessment: Vital Signs Vitals:   03/18/23 0436 03/18/23 0752  BP: (!) 160/64 (!) 178/68  Pulse: 74 76  Resp: 17 19  Temp:  98.8 F (37.1 C)  SpO2: 92% 95%    Intake/Output Summary (Last 24 hours) at 03/18/2023 1030 Last data filed at 03/18/2023 0753 Gross per 24 hour  Intake 0 ml  Output 2250 ml  Net -2250 ml    Last Weight  Most recent update: 03/18/2023  7:49 AM    Weight  78.1 kg (172 lb 2.9 oz)            Gen:  Elderly F in NAD HEENT: dry mucous membranes CV: Regular rate and rhythm PULM: On RA breathing is comfortable and non-labored ABD: soft/nontender  EXT: No edema  Neuro: Alert and oriented x3   SUMMARY OF RECOMMENDATIONS   Full Code / Full scope of care  Appreciate renal navigator [name] [name] helping with ride alternatives to and from HD  treatments  Symptoms at below:  Chronic Pain Continue tylenol 1000 mg tid Continue diclofenac (Flector) 1.3% patch bid Continue lidocaine patch 5% 2 patches daily Continue Robaxin 750mg  tid Discontinue dilaudid Oxycontin 15mg  PO BID Oxycodone 5-10mg  PO Q4H PRN moderate/severe pain Fentanyl 25mg  IVP Q3H PRN breakthrough pain Continue duloxetine (Cymbalta) 60mg  daily Continue pregabalin (Lyrica) 75mg  daily Kpad to back as tolerated Will need a referral to [place] [facility] on discharge - Dr. Laban Emperor if possible  Nausea Continue zofran 4mg  IV q6hr prn  Anxiety Continue duloxetine (Cymbalta) 60mg  daily Continue pregabalin (Lyrica) 75mg  daily Continue diazepam (Valium) 5 mg q12hr prn  Constipation: Senna 2 Tabs PO BID x2 days (Started 5/18) - Using great caution in the setting of C.Diff status   Insomnia Monitor - melatonin made her sleepy and has been stopped  Billing based on MDM: High  Problems Addressed: One acute or chronic illness or injury that poses a threat to life or bodily function  Amount and/or Complexity of Data: Category 3:Discussion of management or test interpretation with external physician/other qualified health care professional/appropriate source (not separately reported)  Risks: Parenteral controlled substances and Drug therapy requiring intensive monitoring for toxicity ______________________________________________________________________________________ [name] [name] [facility] Team Team Cell Phone: [phone] Please utilize secure chat with additional questions, if there is no response within 30 minutes please call the above phone number  Palliative Medicine Team providers are available by phone from 7am to 7pm daily and can be reached through the team cell phone.  Should this patient require assistance outside of these hours, please call the patient's attending physician.

## 2023-03-18 NOTE — Progress Notes (Signed)
[facility] Progress Note Patient: [name] [name] UEA:[id number] DOB: [birth year] DOA: 02/26/2023  DOS: the patient was seen and examined on 03/18/2023  Brief hospital course: PMH of type II DM, neuropathy, thrombocytopenia, OSA, osteomyelitis, hypothyroidism, HTN, CAD, cirrhosis, ESRD now on HD, spinal stenosis SP lumbar fusion, history of renal transplant, PTSD, depression, obesity, fibromyalgia, low-dose metoprolol doses.  BMI baseline.  Presented to the hospital with complaints of back pain.  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  4/29 admitted to the hospital.  MRI controllable Looking for vertebral body, new marrow edema T3.  7 mm epidural fluid collection. 5/2 nephrology was consulted for AKI on CKD 5. 5/3 underwent IR guided T5 bone biopsy and T4-T5 disc aspiration.  IR also placed temp HD catheter for HD. 5/12 vascular surgery was consulted. 5/13 left TDC placement with vascular surgery. 5/14 palliative care consulted for pain management 5/15 underwent aVF placement left brachial cephalic. Had a Fall in the OR while being transferred from the OR table. 5/17 Accepted at [facility] SW GBO TTS pending pt's ability to sit for extended period of time. 5/18 1 PRBC transfusion ordered Assessment and Plan: Acute osteomyelitis discitis thoracic spine persistent at T4, T5 Presents with back pain. MRI T spine 4/30 Persistent T4-T5 discitis/osteomyelitis, In comparison to January 28, 2023, mild interval collapse in the T4 vertebral body. Mild new marrow edema at T3 is new and likely represents osteomyelitis. Mild interval increase in size of a suspected dorsal epidural fluid collection measuring 7 mm, possibly abscess or phlegmon. Negative blood culture 4/29 Biopsy results no growth to date from aspiration Per ID-complete IV daptomycin 04/13/2023, OP F/u 04/12/2023  Pain control Chronic anxiety Appreciate palliative care assistance. Prior to admission patient is on Norco 5-325 4 times daily as  needed. Has been on Valium as needed in the past. Also was on Lyrica 75 mg 3 times daily. Pain control per palliative care.  Currently on long-acting OxyContin and oxycodone.  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  Serum creatinine peaked at 8 leading to start her on HD.  5/3. TDC placement 5/13. AVF placement 5/15. Will need to be able to tolerate HD in recliner for outpatient HD.   Type 2 diabetes mellitus, uncontrolled with hyperglycemia with nephropathy. 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 Hemoglobin A1c less than 5.   Diarrhea C. difficile antigen positive but toxigenic PCR negative. Currently resolved.  Discontinue Questran. Now started on bowel regimen   Urinary Retention. Foley Catheter Placed on 5/2, removed on 5/9, required replacement on 5/10. No prior history of urinary retention.  Unable to tolerate PureWick due to allergic reaction to some substance in PureWick. Discontinue tomorrow 5/20.   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  Monitor.   Normocytic anemia Iron studies 5/3 did not show any deficiency and saturations are good Hemoglobin dropped down to 6.8 on 5/18.  Ordered 1 PRBC transfusion.  No bleeding noted.  Will recheck CBC tomorrow.  Already receiving IV iron as well as EPO support.   Chronic HFpEF EF 65-70%.  Appears euvolemic. Volume management with HD.   HTN Monitor for now. Blood pressure soft.   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 Patient had a fall in the OR  while being transferred from the OR table to the stretcher. CT chest abdomen pelvis shows no evidence of acute trauma. CT head negative as well. X-ray shoulder of the left arm  negative for any acute fracture. Currently has a sling for pain control.   Subjective: Pain present.  No nausea No fever no chills.  Still no BM.  Passing gas.  Oral intake adequate.  Physical Exam: In mild distress. No rash. Trace edema. Bowel sound present.  Nontender.  Data Reviewed: I have Reviewed nursing notes, Vitals, and Lab results. Reviewed CBC and BMP.  Reordered CBC and BMP.  Disposition: Status is: Inpatient Remains inpatient appropriate because: Awaiting ability to sit in the recliner for HD.  apixaban (ELIQUIS) tablet 2.5 mg Start: 03/15/23 1145 Place and maintain sequential compression device Start: 03/01/23 1536 apixaban (ELIQUIS) tablet 2.5 mg   Family Communication: No one at bedside Level of care: Med-Surg   Vitals:   03/18/23 0436 03/18/23 0444 03/18/23 0752 03/18/23 1635  BP: (!) 160/64  (!) 178/68 125/60  Pulse: 74  76 88  Resp: 17  19 16   Temp:   98.8 F (37.1 C) 97.8 F (36.6 C)  TempSrc:   Oral Oral  SpO2: 92%  95% 92%  Weight:  78.1 kg    Height:         Author: Lynden Oxford, MD 03/18/2023 7:03 PM  Please look on [email or web address] to find out who is on call.

## 2023-03-19 DIAGNOSIS — R52 Pain, unspecified: Secondary | ICD-10-CM | POA: Diagnosis not present

## 2023-03-19 DIAGNOSIS — M4624 Osteomyelitis of vertebra, thoracic region: Secondary | ICD-10-CM | POA: Diagnosis not present

## 2023-03-19 DIAGNOSIS — Z7189 Other specified counseling: Secondary | ICD-10-CM | POA: Diagnosis not present

## 2023-03-19 DIAGNOSIS — Z515 Encounter for palliative care: Secondary | ICD-10-CM | POA: Diagnosis not present

## 2023-03-19 LAB — CBC
HCT: 25.7 % — ABNORMAL LOW (ref 36.0–46.0)
Hemoglobin: 7.7 g/dL — ABNORMAL LOW (ref 12.0–15.0)
MCH: 28.1 pg (ref 26.0–34.0)
MCHC: 30 g/dL (ref 30.0–36.0)
MCV: 93.8 fL (ref 80.0–100.0)
Platelets: 117 10*3/uL — ABNORMAL LOW (ref 150–400)
RBC: 2.74 MIL/uL — ABNORMAL LOW (ref 3.87–5.11)
RDW: 16.7 % — ABNORMAL HIGH (ref 11.5–15.5)
WBC: 5 10*3/uL (ref 4.0–10.5)
nRBC: 0 % (ref 0.0–0.2)

## 2023-03-19 LAB — HEPATIC FUNCTION PANEL
ALT: 9 U/L (ref 0–44)
AST: 14 U/L — ABNORMAL LOW (ref 15–41)
Albumin: 2.5 g/dL — ABNORMAL LOW (ref 3.5–5.0)
Alkaline Phosphatase: 118 U/L (ref 38–126)
Bilirubin, Direct: <0.1 mg/dL (ref 0.0–0.2)
Total Bilirubin: 1.2 mg/dL (ref 0.3–1.2)
Total Protein: 6.5 g/dL (ref 6.5–8.1)

## 2023-03-19 LAB — RENAL FUNCTION PANEL
Albumin: 2.5 g/dL — ABNORMAL LOW (ref 3.5–5.0)
Anion gap: 8 (ref 5–15)
BUN: 43 mg/dL — ABNORMAL HIGH (ref 8–23)
CO2: 27 mmol/L (ref 22–32)
Calcium: 8 mg/dL — ABNORMAL LOW (ref 8.9–10.3)
Chloride: 96 mmol/L — ABNORMAL LOW (ref 98–111)
Creatinine, Ser: 3.84 mg/dL — ABNORMAL HIGH (ref 0.44–1.00)
GFR, Estimated: 12 mL/min — ABNORMAL LOW (ref >60)
Glucose, Bld: 97 mg/dL (ref 70–99)
Phosphorus: 3.7 mg/dL (ref 2.5–4.6)
Potassium: 4.1 mmol/L (ref 3.5–5.1)
Sodium: 131 mmol/L — ABNORMAL LOW (ref 135–145)

## 2023-03-19 LAB — GLUCOSE, CAPILLARY: Glucose-Capillary: 122 mg/dL — ABNORMAL HIGH (ref 70–99)

## 2023-03-19 MED ORDER — HEPARIN SODIUM (PORCINE) 1000 UNIT/ML DIALYSIS
1000.0000 [IU] | INTRAMUSCULAR | Status: DC | PRN
  Administered 2023-03-19: 1000 [IU]
  Filled 2023-03-19 (×2): qty 1

## 2023-03-19 MED ORDER — OXYCODONE HCL 5 MG PO TABS
5.0000 mg | ORAL_TABLET | ORAL | Status: DC | PRN
  Filled 2023-03-19: qty 1

## 2023-03-19 MED ORDER — POLYETHYLENE GLYCOL 3350 17 G PO PACK
17.0000 g | PACK | Freq: Two times a day (BID) | ORAL | Status: DC
  Administered 2023-03-19: 17 g via ORAL
  Filled 2023-03-19 (×2): qty 1

## 2023-03-19 MED ORDER — FENTANYL CITRATE PF 50 MCG/ML IJ SOSY
25.0000 ug | PREFILLED_SYRINGE | INTRAMUSCULAR | Status: DC | PRN
  Administered 2023-03-19 – 2023-03-20 (×4): 25 ug via INTRAVENOUS
  Filled 2023-03-19 (×4): qty 1

## 2023-03-19 MED ORDER — ANTICOAGULANT SODIUM CITRATE 4% (200MG/5ML) IV SOLN: 5.0000 mL | Status: DC | PRN

## 2023-03-19 MED ORDER — ALTEPLASE 2 MG IJ SOLR: 2.0000 mg | Freq: Once | INTRAMUSCULAR | Status: DC | PRN

## 2023-03-19 MED ORDER — OXYCODONE HCL 5 MG PO TABS
10.0000 mg | ORAL_TABLET | ORAL | Status: DC | PRN
  Administered 2023-03-19 – 2023-03-29 (×18): 10 mg via ORAL
  Filled 2023-03-19 (×18): qty 2

## 2023-03-19 MED ORDER — FENTANYL CITRATE PF 50 MCG/ML IJ SOSY: 25.0000 ug | PREFILLED_SYRINGE | INTRAMUSCULAR | Status: DC | PRN

## 2023-03-19 NOTE — Progress Notes (Signed)
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  BMI baseline.  Presented to the hospital with complaints of back pain.  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  4/29 admitted to the hospital.  MRI controllable Looking for vertebral body, new marrow edema T3.  7 mm epidural fluid collection. 5/2 nephrology was consulted for AKI on CKD 5. 5/3 underwent IR guided T5 bone biopsy and T4-T5 disc aspiration.  IR also placed temp HD catheter for HD. 5/12 vascular surgery was consulted. 5/13 left TDC placement with vascular surgery. 5/14 palliative care consulted for pain management 5/15 underwent aVF placement left brachial cephalic. Had a Fall in the OR while being transferred from the OR table. 5/17 Accepted at [facility] SW GBO TTS pending pt's ability to sit for extended period of time. 5/18 1 PRBC transfusion ordered Assessment and Plan: Acute osteomyelitis discitis thoracic spine persistent at T4, T5 Presents with back pain. MRI T spine 4/30 Persistent T4-T5 discitis/osteomyelitis, In comparison to January 28, 2023, mild interval collapse in the T4 vertebral body. Mild new marrow edema at T3 is new and likely represents osteomyelitis. Mild interval increase in size of a suspected dorsal epidural fluid collection measuring 7 mm, possibly abscess or phlegmon. Negative blood culture 4/29 Biopsy results no growth to date from aspiration Per ID-complete IV daptomycin 04/13/2023, OP F/u 04/12/2023  Pain control Chronic anxiety Appreciate palliative care assistance. Prior to admission patient is on Norco 5-325 4 times daily as  needed. Has been on Valium as needed in the past. Also was on Lyrica 75 mg 3 times daily. Pain control per palliative care.  Currently on long-acting OxyContin and oxycodone.  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  Serum creatinine peaked at 8 leading to start her on HD.  5/3. TDC placement 5/13. AVF placement 5/15. Will need to be able to tolerate HD in recliner for outpatient HD.  Was able to tolerate HD on 5/20 but had significant pain after that.  Will monitor pain regimen.   Type 2 diabetes mellitus, uncontrolled with hyperglycemia with nephropathy. 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 Hemoglobin A1c less than 5.   Diarrhea C. difficile antigen positive but toxigenic PCR negative. Currently resolved.  Discontinue Questran. Now started on bowel regimen   Urinary Retention. Foley Catheter Placed on 5/2, removed on 5/9, required replacement on 5/10. No prior history of urinary retention.  Unable to tolerate PureWick due to allergic reaction to some substance in PureWick. Foley catheter removed on 5/20.   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  Monitor.   Normocytic anemia Iron studies 5/3 did not show any deficiency and saturations are good Hemoglobin dropped down to 6.8 on 5/18.  Ordered 1 PRBC transfusion.  No bleeding noted.  Will recheck CBC tomorrow.  Already receiving IV iron as well as EPO support.   Chronic HFpEF EF 65-70%.  Appears euvolemic. Volume management with HD.   HTN Monitor for now. Blood pressure soft.   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 Patient had a fall in the OR while being transferred from the OR table to the stretcher. CT chest abdomen  pelvis shows no evidence of acute trauma. CT head negative as well. X-ray shoulder of the left arm negative for any acute fracture. Currently has a sling for pain control.   Subjective: Pain unchanged.  No nausea no vomiting no fever no chills.  In the recliner.  Physical Exam: In moderate distress. No rash. No edema. Bowel sound present.  Nontender.  Data Reviewed: I have Reviewed nursing notes, Vitals, and Lab results. Reviewed CBC and BMP.  Reordered CBC and BMP.  Discussed with palliative care.  Disposition: Status is: Inpatient Remains inpatient appropriate because: Awaiting ability to sit in the recliner for HD.  apixaban (ELIQUIS) tablet 2.5 mg Start: 03/15/23 1145 Place and maintain sequential compression device Start: 03/01/23 1536 apixaban (ELIQUIS) tablet 2.5 mg   Family Communication: No one at bedside Level of care: Med-Surg   Vitals:   03/19/23 1200 03/19/23 1210 03/19/23 1219 03/19/23 1714  BP: 121/74 (!) 143/69 130/64 123/71  Pulse: 81 80 81 88  Resp: (!) 9 18 12 17   Temp:   98.3 F (36.8 C) 98 F (36.7 C)  TempSrc:   Oral Oral  SpO2: 100% 100% 100% 95%  Weight:      Height:         Author: Lynden Oxford, MD 03/19/2023 6:33 PM  Please look on [email or web address] to find out who is on call.

## 2023-03-19 NOTE — Progress Notes (Signed)
Physical Therapy Treatment Patient Details Name: [name] [name] MRN: [id number] DOB: May 20, 1956 Today's Date: 03/19/2023   History of Present Illness Pt is 67 year old who is moslty wheelchair bound and has CKD stage V, hypertension, cirrhosis of the liver, OSA, morbid obesity, L1 osteomyelitis status post L1 corpectomy, T10-T11 discitis osteomyelitis status post posterior arthrodesis of T8-T11 and removal of T11 pedicle screws and rod in 12/23.  Last month, diagnosed with T4-T5 discitis osteomyelitis which was being treated with daptomycin.  Neurosurgery did not recommend any further interventions.     The patient returns to the hospital for worsening back pain for [redacted] week along with an episode of vomiting.  Pt found to have AKI requiring 2 cycles of HD.  Also, with ongoing osteomyelitis/discitis Thoracic spine with T4-5 disc aspiration by IR on 5/3.  Neurosurgery recommends further medical management, no surgical intervention at this time.    PT Comments    Pt received in supine and agreeable to session with encouragement. Pt reporting ability to tolerate sitting in recliner this morning for HD for ~3.5 hours, however she reports increased back pain. Pt requiring cues for log roll, but is able to perform bed mobility without physical assist. Pt able to tolerate standing from EOB x3 and performing seated and standing exercises. Pt deferred sitting in the recliner at the end of the session due to sitting up for HD this morning. Pt continues to benefit from PT services to progress toward functional mobility goals.     Recommendations for follow up therapy are one component of a multi-disciplinary discharge planning process, led by the attending physician.  Recommendations may be updated based on patient status, additional functional criteria and insurance authorization.     Assistance Recommended at Discharge Intermittent Supervision/Assistance  Patient can return home with the following A little  help with walking and/or transfers;A little help with bathing/dressing/bathroom;Assistance with cooking/housework;Help with stairs or ramp for entrance   Equipment Recommendations  None recommended by PT    Recommendations for Other Services       Precautions / Restrictions Precautions Precautions: Back Precaution Booklet Issued: No Precaution Comments: verbally reviewed precautions Required Braces or Orthoses: Spinal Brace Spinal Brace: Thoracolumbosacral orthotic Spinal Brace Comments: No orders but pt had from prior admission and wears when OOB Restrictions Weight Bearing Restrictions: No     Mobility  Bed Mobility Overal bed mobility: Needs Assistance Bed Mobility: Sidelying to Sit, Rolling, Sit to Sidelying Rolling: Supervision Sidelying to sit: Min guard     Sit to sidelying: Min guard General bed mobility comments: Cues for logroll technique and min guard for safety. Cues to scoot forward to EOB. Pt twisting trunk to grab handrail due to side pain and pt instructed to hug pillow and logroll to side for comfort    Transfers Overall transfer level: Needs assistance Equipment used: Rolling walker (2 wheels) Transfers: Sit to/from Stand Sit to Stand: Min guard           General transfer comment: From low EOB x3 with min guard for safety and pt demonstrating safe hand placement       Balance Overall balance assessment: Needs assistance Sitting-balance support: Feet supported Sitting balance-Leahy Scale: Good Sitting balance - Comments: sitting EOB   Standing balance support: Reliant on assistive device for balance, Bilateral upper extremity supported, During functional activity Standing balance-Leahy Scale: Fair Standing balance comment: with RW support  Cognition Arousal/Alertness: Awake/alert Behavior During Therapy: WFL for tasks assessed/performed, Anxious Overall Cognitive Status: Within Functional Limits for  tasks assessed                                          Exercises General Exercises - Lower Extremity Long Arc Quad: AROM, Both, Seated, 10 reps Hip Flexion/Marching: AROM, Standing, Both, 10 reps    General Comments General comments (skin integrity, edema, etc.): Pt reporting dizziness upon standing that improved during mobility      Pertinent Vitals/Pain Pain Assessment Pain Assessment: Faces Faces Pain Scale: Hurts whole lot Pain Location: Back and R side Pain Descriptors / Indicators: Grimacing, Guarding Pain Intervention(s): Monitored during session, Repositioned, Limited activity within patient's tolerance     PT Goals (current goals can now be found in the care plan section) Acute Rehab PT Goals Patient Stated Goal: return home PT Goal Formulation: With patient/family Time For Goal Achievement: 03/20/23 Potential to Achieve Goals: Good Progress towards PT goals: Progressing toward goals    Frequency    Min 3X/week      PT Plan Current plan remains appropriate       AM-PAC PT "6 Clicks" Mobility   Outcome Measure  Help needed turning from your back to your side while in a flat bed without using bedrails?: A Little Help needed moving from lying on your back to sitting on the side of a flat bed without using bedrails?: A Little Help needed moving to and from a bed to a chair (including a wheelchair)?: A Little Help needed standing up from a chair using your arms (e.g., wheelchair or bedside chair)?: A Little Help needed to walk in hospital room?: A Lot Help needed climbing 3-5 steps with a railing? : Total 6 Click Score: 15    End of Session   Activity Tolerance: Patient limited by pain;Patient limited by fatigue Patient left: in bed;with call bell/phone within reach Nurse Communication: Mobility status PT Visit Diagnosis: Other abnormalities of gait and mobility (R26.89);Muscle weakness (generalized) (M62.81);Pain     Time:  0981-1914 PT Time Calculation (min) (ACUTE ONLY): 23 min  Charges:  $Therapeutic Exercise: 8-22 mins $Therapeutic Activity: 8-22 mins                     Johny Shock, PTA Acute Rehabilitation Services Secure Chat Preferred  [phone]    Johny Shock 03/19/2023, 2:44 PM

## 2023-03-19 NOTE — Progress Notes (Signed)
POST HD TX NOTE  03/19/23 1219  Vitals  Temp 98.3 F (36.8 C)  Temp Source Oral  BP 130/64  MAP (mmHg) 78  BP Location Right Arm  BP Method Automatic  Patient Position (if appropriate) Lying  Pulse Rate 81  Pulse Rate Source Monitor  ECG Heart Rate 82  Resp 12  Oxygen Therapy  SpO2 100 %  O2 Device Nasal Cannula  O2 Flow Rate (L/min) 2 L/min  Pulse Oximetry Type Continuous  During Treatment Monitoring  Intra-Hemodialysis Comments (S)   (post HD tx VS check)  Post Treatment  Dialyzer Clearance Lightly streaked  Duration of HD Treatment -hour(s) 3.5 hour(s)  Hemodialysis Intake (mL) 0 mL  Liters Processed 84  Fluid Removed (mL) 0 mL  Tolerated HD Treatment Yes  Post-Hemodialysis Comments (S)  tx completed w/o problem, UF goal of keep even met, blood rinsed back, VSS: Medication Admin: Heparin Dwells 4200 units  Hemodialysis Catheter Left Subclavian Double lumen Permanent (Tunneled)  Placement Date/Time: 03/12/23 1402   Placed prior to admission: No  Serial / Lot #: [id number]  Expiration Date: 08/26/24  Time Out: Correct patient;Correct site;Correct procedure  Maximum sterile barrier precautions: Hand hygiene;Cap;Mask;Sterile...  Site Condition No complications  Blue Lumen Status Heparin locked;Dead end cap in place  Red Lumen Status Heparin locked;Dead end cap in place  Purple Lumen Status N/A  Catheter fill solution Heparin 1000 units/ml  Catheter fill volume (Arterial) 2.1 cc  Catheter fill volume (Venous) 2.1  Dressing Type Transparent  Dressing Status Antimicrobial disc in place;Clean, Dry, Intact  Drainage Description None  Dressing Change Due 03/22/23  Post treatment catheter status Capped and Clamped

## 2023-03-19 NOTE — Progress Notes (Addendum)
Daily Progress Note   Patient Name: [name] [name]       Date: 03/19/2023 DOB: 10/05/1956  Age: 67 y.o. MRN#: [id number] Attending Physician: Rolly Salter, MD Primary Care Physician: Zola Button, Grayling Congress, DO Admit Date: 02/26/2023  Reason for Consultation/Follow-up: Establishing goals of care and Pain control  Subjective: Patient was laying in bed finishing up with PT. She reports being in extreme pain.  Length of Stay: 20  Current Medications: Scheduled Meds:   acetaminophen  1,000 mg Oral TID   acidophilus  2 capsule Oral TID   apixaban  2.5 mg Oral BID   calcium carbonate  1 tablet Oral Daily   Chlorhexidine Gluconate Cloth  6 each Topical Q0600   darbepoetin (ARANESP) injection - NON-DIALYSIS  100 mcg Subcutaneous Q Fri-1800   diclofenac  1 patch Transdermal BID   DULoxetine  60 mg Oral Daily   feeding supplement (NEPRO CARB STEADY)  237 mL Oral TID BM   furosemide  40 mg Intravenous Q12H   lidocaine  2 patch Transdermal Q24H   methocarbamol  750 mg Oral TID   mometasone-formoterol  2 puff Inhalation BID   oxyCODONE  15 mg Oral Q12H   polyethylene glycol  17 g Oral BID   pregabalin  75 mg Oral Daily   senna-docusate  2 tablet Oral BID   sodium chloride flush  10-40 mL Intracatheter Q12H   sodium chloride flush  3 mL Intravenous Q12H    Continuous Infusions:  DAPTOmycin (CUBICIN) 650 mg in sodium chloride 0.9 % IVPB 650 mg (03/17/23 1741)    PRN Meds: albuterol, alteplase, alum & mag hydroxide-simeth, diazepam, fentaNYL (SUBLIMAZE) injection, heparin, hydrALAZINE, lidocaine (PF), lidocaine-prilocaine, naLOXone (NARCAN)  injection, ondansetron (ZOFRAN) IV, mouth rinse, oxyCODONE, oxyCODONE, pentafluoroprop-tetrafluoroeth  Physical Exam Constitutional:       Appearance: She is ill-appearing.  Pulmonary:     Effort: Pulmonary effort is normal.  Skin:    General: Skin is warm and dry.  Neurological:     Mental Status: She is alert.  Psychiatric:        Mood and Affect: Mood normal.             Vital Signs: BP 130/64 (BP Location: Right Arm)   Pulse 81   Temp 98.3 F (36.8 C) (Oral)   Resp  12   Ht 4\' 11"  (1.499 m)   Wt 78.1 kg   SpO2 100%   BMI 34.78 kg/m  SpO2: SpO2: 100 % O2 Device: O2 Device: Nasal Cannula O2 Flow Rate: O2 Flow Rate (L/min): 2 L/min  Intake/output summary:  Intake/Output Summary (Last 24 hours) at 03/19/2023 1506 Last data filed at 03/19/2023 1219 Gross per 24 hour  Intake --  Output 950 ml  Net -950 ml   LBM: Last BM Date : 03/14/23 Baseline Weight: Weight: 82.1 kg Most recent weight: Weight:  (UTA d/t pt being in recliner)       Palliative Assessment/Data: 40%      Patient Active Problem List   Diagnosis Date Noted   Acute osteomyelitis of thoracic spine (HCC) 02/27/2023   Vertebral osteomyelitis (HCC) 01/28/2023   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   Osteomyelitis (HCC) 06/20/2022   Estrogen deficiency 05/25/2022   CKD (chronic kidney disease) stage 5, GFR less than 15 ml/min (HCC) 02/14/2022   Prolonged QT interval 01/07/2022   Streptococcal bacteremia    Osteomyelitis of left foot (HCC) 10/16/2021   Diabetic foot ulcers (HCC) 10/16/2021   Portal hypertension (HCC) 08/16/2021   Eczema of scalp 06/27/2021   Hypothyroidism 06/27/2021   Pruritus 06/27/2021   Seasonal allergic rhinitis due to pollen 02/02/2021   PTSD (post-traumatic stress disorder)    Osteoarthritis    MRSA (methicillin resistant Staphylococcus aureus)    Hypertension    High cholesterol    Hepatic steatosis    GERD (gastroesophageal reflux disease)    Fibromyalgia    Episode of visual loss of left eye    Depression    Constipation    Cataract    Bulging lumbar disc    Asthma    Anxiety    Anemia    Tinea  capitis 10/11/2020   Tinea corporis 10/11/2020   Hip pain 05/16/2020   IDA (iron deficiency anemia) 05/04/2020   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) 03/16/2020   S/P lumbar fusion 03/04/2020   Spinal stenosis of lumbar region 01/22/2020   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) 01/21/2020   Compression fracture of L1 lumbar vertebra (HCC) 11/17/2019   Fracture of multiple ribs 11/17/2019   Nausea & vomiting 11/15/2019   Diarrhea 09/04/2019   Pressure injury of skin 04/17/2019   Septic arthritis of elbow, right (HCC) 04/17/2019   Retinopathy 04/17/2019   Renal transplant, 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   Sleep apnea 11/16/2017   Diabetic foot infection (HCC) 04/06/2017   Type 2 diabetes mellitus with hyperglycemia, with long-term current use of insulin (HCC) 04/06/2017   Neuropathy 05/05/2015   Diabetic peripheral neuropathy (HCC) 01/12/2015   CKD (chronic kidney disease), stage III (HCC) 05/27/2014   History of MI (myocardial infarction) 10/06/2013   Chest pain 10/06/2013   Nausea and vomiting 10/06/2013   Obesity (BMI 30-39.9) 09/20/2013   Multinodular goiter 04/17/2013   Normocytic anemia 02/03/2013   CAD (coronary artery disease) 01/11/2012   UTI (urinary tract infection) 08/05/2010   Hepatic cirrhosis (HCC) 07/06/2010   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   Hyperlipidemia 06/03/2008   Essential hypertension 12/23/2006   Acute MI (HCC) 2007    Palliative Care Assessment & Plan   Patient Profile: [name] [name] is a 67 y.o. adult with medical history significant of CKD 5,diabetes, neuropathy, thrombocytopenia, OSA, osteomyelitis, diabetic foot infections, anemia, hypothyroidism, hypertension, hyperlipidemia, CAD, cirrhosis, asthma, spinal stenosis, status post lumbar fusion, status post renal transplant (age 10), PTSD, depression, anxiety,  obesity, fibromyalgia, DVT, cataracts presenting with worsening back pain, nausea, vomiting. She  presented to the hospital for back pain, abdominal pain, nausea, and vomiting and was admitted on 02/26/23.   Assessment: [name] [name] had just finished with PT when I met with her. She seems very motivated to improve to go home. Her pain is a barrier to both tolerating dialysis and going home. We discussed we will need to transition her to all PO pain medications before discharge. [name] [name] was agreeable to using her PRN PO pain medication first and then using the PRN IV fentanyl for breakthrough pain. She has not had a bowel movement yet. We discussed adding Miralax to her bowel regimen.   During our discussion she discussed how much support her husband Laban Emperor provides her. He supports her in every way. He is working on [employer].  Recommendations/Plan: Full code / Full scope of care  Symptoms at below:   Chronic Pain Continue tylenol 1000 mg tid Continue diclofenac (Flector) 1.3% patch bid Continue lidocaine patch 5% 2 patches daily Continue Robaxin 750mg  tid Oxycontin 15mg  PO BID Oxycodone 5-10mg  PO Q4H PRN moderate/severe pain (administration note to use PRN PO pain medication as first line) Fentanyl 25mg  IVP Q3H PRN breakthrough pain Continue duloxetine (Cymbalta) 60mg  daily Continue pregabalin (Lyrica) 75mg  daily Kpad to back as tolerated Will need a referral to [facility] on discharge - Dr. Laban Emperor if possible   Nausea Continue zofran 4mg  IV q6hr prn   Anxiety Continue duloxetine (Cymbalta) 60mg  daily Continue pregabalin (Lyrica) 75mg  daily Continue diazepam (Valium) 5 mg q12hr prn   Constipation: Senna 2 Tabs PO BID x2 days (Started 5/18) - Using great caution in the setting of C.Diff status Add Miralax PO BID     Insomnia Monitor - melatonin made her sleepy and has been stopped  Goals of Care and Additional Recommendations: Limitations on Scope of Treatment: Full Scope Treatment  Code Status:    Code Status Orders  (From  admission, onward)           Start     Ordered   02/26/23 1448  Full code  Continuous       Question:  By:  Answer:  Consent: discussion documented in EHR   02/26/23 1449           Code Status History     Date Active Date Inactive Code Status Order ID Comments User Context   01/28/2023 2336 02/04/2023 1730 Full Code [id number]  John Giovanni, MD ED   06/21/2022 0025 07/11/2022 2159 Full Code [id number]  Carollee Herter, DO ED   01/07/2022 0511 01/09/2022 2045 Full Code [id number]  Howerter, Chaney Born, DO Inpatient   10/17/2021 0420 10/22/2021 1928 Full Code [id number]  Chotiner, Claudean Severance, MD ED   02/18/2020 2241 02/26/2020 2334 Full Code [id number]  Lewie Chamber, MD Inpatient   01/29/2020 1704 02/18/2020 2044 Full Code [id number]  Charlton Amor, PA-C Inpatient   01/29/2020 1704 01/29/2020 1704 Full Code [id number]  Charlton Amor, PA-C Inpatient   01/22/2020 2141 01/29/2020 1650 Full Code [id number]  Bedelia Person, MD Inpatient   01/21/2020 0012 01/22/2020 2141 Full Code [id number]  Eduard Clos, MD Inpatient   11/15/2019 2200 11/18/2019 2226 Full Code [id number]  Pearson Grippe, MD ED   04/15/2019 2122 04/21/2019 2320 Full Code [id number]  Rometta Emery, MD Inpatient   04/06/2017 2004 04/11/2017 2109 Full Code [id number]  Alberteen Sam, MD Inpatient   09/29/2016 1831 10/02/2016 1643 Full  Code [id number]  Nadara Mustard, MD Inpatient   03/19/2013 1507 03/21/2013 1812 Full Code [id number]  Nadara Mustard, MD Inpatient   02/10/2013 2124 02/20/2013 1859 Full Code [id number]  Nadara Mustard, MD Inpatient   02/03/2013 2137 02/10/2013 2124 Full Code [id number]  Cristal Ford, MD Inpatient   01/11/2012 0612 01/13/2012 1645 Full Code [id number]  Wilder Glade, RN Inpatient       Prognosis:  Unable to determine  Discharge Planning: To Be Determined  Time spent: 60 minutes  Care plan was discussed with Dr. Allena Katz and bedside RN  Thank you for allowing the Palliative Medicine Team to assist in  the care of this patient.   Sherryll Burger, NP  Please contact Palliative Medicine Team phone at [phone] for questions and concerns.

## 2023-03-19 NOTE — Social Work (Signed)
CSW was advised that pt is concerned about transport to HD outpatient. Pt had previously advised staff that her spouse could take her. CSW added transportation resources to AVS. CSW to provide access [place] application to pt and spouse to complete and will submit it. TOC will continue to follow for additional needs.

## 2023-03-19 NOTE — Progress Notes (Addendum)
Contacted by Palliative Care provider this morning regarding pt's concerns for transportation to/from HD at d/c. Contacted TOC staff this morning with pt's request for assistance/options for transportation at d/c. Pt had advised navigator last week that pt's husband would likely assist with transportation to/from HD. Appears that may have changed over the weekend. Will assist as needed.   Olivia Canter Renal Navigator [phone]  Addendum at 3:45 pm: Spoke to pt and pt's husband via phone. Both aware and agreeable to out-pt HD arrangements/schedule. Case discussed with nephrologist. Pt able to tolerate HD in the chair today and pt will need iv daptomycin with HD until 6/14. Contacted clinic this afternoon to make them aware of iv abx need at d/c and that pt was able to tolerate HD in chair today. Clinic advised navigator on Friday that med is available at clinic.

## 2023-03-19 NOTE — Progress Notes (Signed)
[facility] Progress Note   Assessment/ Plan:   New ESRD: Baseline creatinine was around 4.5 prior to admission.  AKI likely ATN.  Did require dialysis earlier in the hospitalization.  Had some improvement in kidney function as well as urine output and dialysis catheter was removed.  Now with persistent low urine output and rising creatinine most likely ESRD at this point  -[facility] placement 03/12/23, appreciate assistance - CLIP in process - Appreciate lt BCF by Dr. Chestine Spore on 5/15 -Continue to monitor daily Cr, Dose meds for GFR -Monitor Daily I/Os, Daily weight  -Maintain MAP>65 for optimal renal perfusion.  -Avoid nephrotoxic medications including NSAIDs -Use synthetic opioids (Fentanyl/Dilaudid) if needed - HD 5/17, in the bed  She tolerated HD in the chair today; plan next HD Wed on MWF regimen.   Anemia: Likely multifactorial with inflammation and CKD contributing.  Increased ESA to 100 mcg weekly.  Iron replete.   HFpEF: Volume status overall acceptable.  Continue to monitor   Osteomyelitis: On daptomycin.  Per primary team   DM2: Management per primary   CKD BMD: phos acceptable. Ca corrects near normal. F/u PTH    Subjective:    Seen in room- more comfortable this AM; denies cramping end of dialysis today. Just very fatigued.   Objective:   BP 130/64 (BP Location: Right Arm)   Pulse 81   Temp 98.3 F (36.8 C) (Oral)   Resp 12   Ht 4\' 11"  (1.499 m)   Wt 78.1 kg   SpO2 100%   BMI 34.78 kg/m   Physical Exam: Gen:NAD CVS: RRR Resp: clear Abd: soft Ext: no  LE edema ACCESS: IJ TDC, L BCF + T/B  Labs: BMET Recent Labs  Lab 03/13/23 0949 03/14/23 0355 03/14/23 1341 03/15/23 0029 03/16/23 0326 03/17/23 0420 03/18/23 0421 03/19/23 0427  NA 136 137 140 134* 131* 135 133* 131*  K 4.0 4.2 5.1 3.9 3.9 3.9 4.0 4.1  CL 102 103 103 97* 95* 98 97* 96*  CO2 25 25  --  29 27 30 27 27   GLUCOSE 79 108* 95 82 83 82 78 97  BUN 28* 40* 39* 16 29* 15 26*  43*  CREATININE 3.06* 3.84* 4.60* 2.06* 3.53* 2.39* 3.39* 3.84*  CALCIUM 7.9* 7.6*  --  8.2* 8.5* 8.3* 8.2* 8.0*  PHOS 4.1 3.9  --  2.6 3.5 2.6 3.8 3.7   CBC Recent Labs  Lab 03/17/23 0420 03/17/23 1700 03/18/23 0421 03/19/23 0427  WBC 4.0 4.4 4.3 5.0  HGB 6.8* 8.4* 8.2* 7.7*  HCT 22.6* 28.3* 26.7* 25.7*  MCV 93.4 95.0 95.0 93.8  PLT 108* 109* 115* 117*      Medications:     acetaminophen  1,000 mg Oral TID   acidophilus  2 capsule Oral TID   apixaban  2.5 mg Oral BID   calcium carbonate  1 tablet Oral Daily   Chlorhexidine Gluconate Cloth  6 each Topical Q0600   darbepoetin (ARANESP) injection - NON-DIALYSIS  100 mcg Subcutaneous Q Fri-1800   diclofenac  1 patch Transdermal BID   DULoxetine  60 mg Oral Daily   feeding supplement (NEPRO CARB STEADY)  237 mL Oral TID BM   furosemide  40 mg Intravenous Q12H   lidocaine  2 patch Transdermal Q24H   methocarbamol  750 mg Oral TID   mometasone-formoterol  2 puff Inhalation BID   oxyCODONE  15 mg Oral Q12H   polyethylene glycol  17 g Oral BID  pregabalin  75 mg Oral Daily   senna-docusate  2 tablet Oral BID   sodium chloride flush  10-40 mL Intracatheter Q12H   sodium chloride flush  3 mL Intravenous Q12H

## 2023-03-20 DIAGNOSIS — M797 Fibromyalgia: Secondary | ICD-10-CM | POA: Diagnosis not present

## 2023-03-20 DIAGNOSIS — M4624 Osteomyelitis of vertebra, thoracic region: Secondary | ICD-10-CM | POA: Diagnosis not present

## 2023-03-20 DIAGNOSIS — Z515 Encounter for palliative care: Secondary | ICD-10-CM | POA: Diagnosis not present

## 2023-03-20 LAB — RENAL FUNCTION PANEL
Albumin: 2.6 g/dL — ABNORMAL LOW (ref 3.5–5.0)
Anion gap: 9 (ref 5–15)
BUN: 25 mg/dL — ABNORMAL HIGH (ref 8–23)
CO2: 27 mmol/L (ref 22–32)
Calcium: 8.3 mg/dL — ABNORMAL LOW (ref 8.9–10.3)
Chloride: 96 mmol/L — ABNORMAL LOW (ref 98–111)
Creatinine, Ser: 2.7 mg/dL — ABNORMAL HIGH (ref 0.44–1.00)
GFR, Estimated: 19 mL/min — ABNORMAL LOW (ref >60)
Glucose, Bld: 98 mg/dL (ref 70–99)
Phosphorus: 2.3 mg/dL — ABNORMAL LOW (ref 2.5–4.6)
Potassium: 3.7 mmol/L (ref 3.5–5.1)
Sodium: 132 mmol/L — ABNORMAL LOW (ref 135–145)

## 2023-03-20 LAB — CBC
HCT: 25.5 % — ABNORMAL LOW (ref 36.0–46.0)
Hemoglobin: 7.7 g/dL — ABNORMAL LOW (ref 12.0–15.0)
MCH: 28.5 pg (ref 26.0–34.0)
MCHC: 30.2 g/dL (ref 30.0–36.0)
MCV: 94.4 fL (ref 80.0–100.0)
Platelets: 114 10*3/uL — ABNORMAL LOW (ref 150–400)
RBC: 2.7 MIL/uL — ABNORMAL LOW (ref 3.87–5.11)
RDW: 16.8 % — ABNORMAL HIGH (ref 11.5–15.5)
WBC: 5.9 10*3/uL (ref 4.0–10.5)
nRBC: 0 % (ref 0.0–0.2)

## 2023-03-20 LAB — GLUCOSE, CAPILLARY
Glucose-Capillary: 130 mg/dL — ABNORMAL HIGH (ref 70–99)
Glucose-Capillary: 138 mg/dL — ABNORMAL HIGH (ref 70–99)
Glucose-Capillary: 142 mg/dL — ABNORMAL HIGH (ref 70–99)
Glucose-Capillary: 69 mg/dL — ABNORMAL LOW (ref 70–99)
Glucose-Capillary: 74 mg/dL (ref 70–99)

## 2023-03-20 LAB — CK: Total CK: 63 U/L (ref 38–234)

## 2023-03-20 MED ORDER — POLYETHYLENE GLYCOL 3350 17 G PO PACK
17.0000 g | PACK | Freq: Every day | ORAL | Status: DC
  Administered 2023-03-21: 17 g via ORAL
  Filled 2023-03-20 (×5): qty 1

## 2023-03-20 MED ORDER — FUROSEMIDE 40 MG PO TABS
80.0000 mg | ORAL_TABLET | Freq: Two times a day (BID) | ORAL | Status: DC
  Administered 2023-03-20 – 2023-03-22 (×6): 80 mg via ORAL
  Filled 2023-03-20 (×6): qty 2

## 2023-03-20 MED ORDER — DOCUSATE SODIUM 100 MG PO CAPS
100.0000 mg | ORAL_CAPSULE | Freq: Two times a day (BID) | ORAL | Status: DC
  Administered 2023-03-21 – 2023-03-25 (×9): 100 mg via ORAL
  Filled 2023-03-20 (×9): qty 1

## 2023-03-20 NOTE — Progress Notes (Signed)
Occupational Therapy Treatment Patient Details Name: [name] [name] MRN: [id number] DOB: 07/12/1956 Today's Date: 03/20/2023   History of present illness Pt is 67 year old who is moslty wheelchair bound and has CKD stage V, hypertension, cirrhosis of the liver, OSA, morbid obesity, L1 osteomyelitis status post L1 corpectomy, T10-T11 discitis osteomyelitis status post posterior arthrodesis of T8-T11 and removal of T11 pedicle screws and rod in 12/23.  Last month, diagnosed with T4-T5 discitis osteomyelitis which was being treated with daptomycin.  Neurosurgery did not recommend any further interventions.     The patient returns to the hospital for worsening back pain for [redacted] week along with an episode of vomiting.  Pt found to have AKI requiring 2 cycles of HD.  Also, with ongoing osteomyelitis/discitis Thoracic spine with T4-5 disc aspiration by IR on 5/3.  Neurosurgery recommends further medical management, no surgical intervention at this time.   OT comments  Pt is in good spirits today, some pain and fatigue. Pt actively participated in skilled therapy. Pt able to perform bed mobility mod I with increased time and use HOB elevated. Pt able to ambulate up to 20-30 feet at a time today before needing a break for 2-3 mins. Pt able to perform dressing on EOB with set up. Pt feels confident about going home with husband, and getting in/out of w/c and getting around house as needed. Pt will be seen acutely during stay, no follow up OT needed as Pt has all DME at home needed to remain safe, has consistent family support, and displays good safety awareness, educated on energy conservation and precautions.    Recommendations for follow up therapy are one component of a multi-disciplinary discharge planning process, led by the attending physician.  Recommendations may be updated based on patient status, additional functional criteria and insurance authorization.    Assistance Recommended at Discharge  Intermittent Supervision/Assistance  Patient can return home with the following  A little help with walking and/or transfers;A little help with bathing/dressing/bathroom;Assistance with cooking/housework;Assist for transportation;Help with stairs or ramp for entrance   Equipment Recommendations  None recommended by OT    Recommendations for Other Services      Precautions / Restrictions Precautions Precautions: Back Required Braces or Orthoses: Spinal Brace Spinal Brace: Thoracolumbosacral orthotic Spinal Brace Comments: No orders but pt had from prior admission and wears when OOB Restrictions Weight Bearing Restrictions: No       Mobility Bed Mobility Overal bed mobility: Modified Independent             General bed mobility comments: increased time    Transfers Overall transfer level: Needs assistance Equipment used: Rolling walker (2 wheels) Transfers: Sit to/from Stand, Bed to chair/wheelchair/BSC Sit to Stand: Min guard     Step pivot transfers: Min guard     General transfer comment: able to ambulate from bed to chair with min guard.     Balance Overall balance assessment: Needs assistance Sitting-balance support: Feet supported Sitting balance-Leahy Scale: Good Sitting balance - Comments: sitting EOB performing ADLs   Standing balance support: During functional activity, Reliant on assistive device for balance Standing balance-Leahy Scale: Fair Standing balance comment: with RW support                           ADL either performed or assessed with clinical judgement   ADL Overall ADL's : Needs assistance/impaired  Upper Body Dressing : Set up;Sitting   Lower Body Dressing: Set up;Sit to/from stand   Toilet Transfer: Chemical engineer (2 wheels)           Functional mobility during ADLs: Min guard;Rolling walker (2 wheels) General ADL Comments: Pt improving, able to perform ambulation  in room to toilet min guard, 3-4 minute rest break to catch breath    Extremity/Trunk Assessment              Vision       Perception     Praxis      Cognition Arousal/Alertness: Awake/alert Behavior During Therapy: WFL for tasks assessed/performed, Anxious Overall Cognitive Status: Within Functional Limits for tasks assessed                                 General Comments: Pt doing well today, in good spirits        Exercises      Shoulder Instructions       General Comments      Pertinent Vitals/ Pain       Pain Assessment Pain Assessment: 0-10 Pain Score: 4  Faces Pain Scale: Hurts little more Pain Location: Back and R side Pain Descriptors / Indicators: Grimacing, Guarding Pain Intervention(s): Monitored during session  Home Living                                          Prior Functioning/Environment              Frequency  Min 2X/week        Progress Toward Goals  OT Goals(current goals can now be found in the care plan section)  Progress towards OT goals: Progressing toward goals  Acute Rehab OT Goals Patient Stated Goal: to return home and improve functional independence OT Goal Formulation: With patient Time For Goal Achievement: 03/19/23 Potential to Achieve Goals: Good ADL Goals Pt Will Perform Grooming: with set-up;sitting Pt Will Transfer to Toilet: with supervision;regular height toilet;stand pivot transfer;bedside commode  Plan Discharge plan remains appropriate    Co-evaluation                 AM-PAC OT "6 Clicks" Daily Activity     Outcome Measure   Help from another person eating meals?: None Help from another person taking care of personal grooming?: A Little Help from another person toileting, which includes using toliet, bedpan, or urinal?: A Little Help from another person bathing (including washing, rinsing, drying)?: A Little Help from another person to put on and  taking off regular upper body clothing?: A Little Help from another person to put on and taking off regular lower body clothing?: A Little 6 Click Score: 19    End of Session Equipment Utilized During Treatment: Gait belt;Rolling walker (2 wheels)  OT Visit Diagnosis: Unsteadiness on feet (R26.81)   Activity Tolerance Patient tolerated treatment well   Patient Left in bed;with call bell/phone within reach   Nurse Communication Mobility status        Time: 1610-9604 OT Time Calculation (min): 33 min  Charges: OT General Charges $OT Visit: 1 Visit OT Treatments $Self Care/Home Management : 8-22 mins $Therapeutic Activity: 8-22 mins  Alise Calais, OTR/L   Kaysie Michelini R Jovonte Commins 03/20/2023, 5:20 PM

## 2023-03-20 NOTE — Progress Notes (Signed)
Daily Progress Note   Patient Name: [name] [name]       Date: 03/20/2023 DOB: [birth year]  Age: 67 y.o. MRN#: [id number] Attending Physician: [name] Salter, MD Primary Care Physician: [name] [name], [name] Congress, DO Admit Date: 02/26/2023  Reason for Consultation/Follow-up: Establishing goals of care and Pain control  Subjective: Patient was sitting up in bed. She appeared uncomfortable. States she doesn't feel as good as usual today.  Length of Stay: 21  Current Medications: Scheduled Meds:  . acetaminophen  1,000 mg Oral TID  . acidophilus  2 capsule Oral TID  . apixaban  2.5 mg Oral BID  . calcium carbonate  1 tablet Oral Daily  . Chlorhexidine Gluconate Cloth  6 each Topical Q0600  . darbepoetin (ARANESP) injection - NON-DIALYSIS  100 mcg Subcutaneous Q Fri-1800  . diclofenac  1 patch Transdermal BID  . DULoxetine  60 mg Oral Daily  . feeding supplement (NEPRO CARB STEADY)  237 mL Oral TID BM  . furosemide  80 mg Oral BID  . lidocaine  2 patch Transdermal Q24H  . methocarbamol  750 mg Oral TID  . mometasone-formoterol  2 puff Inhalation BID  . oxyCODONE  15 mg Oral Q12H  . polyethylene glycol  17 g Oral BID  . pregabalin  75 mg Oral Daily  . senna-docusate  2 tablet Oral BID  . sodium chloride flush  10-40 mL Intracatheter Q12H  . sodium chloride flush  3 mL Intravenous Q12H    Continuous Infusions: . DAPTOmycin (CUBICIN) 650 mg in sodium chloride 0.9 % IVPB 650 mg (03/19/23 1614)    PRN Meds: albuterol, alteplase, diazepam, heparin, hydrALAZINE, lidocaine (PF), lidocaine-prilocaine, naLOXone (NARCAN)  injection, ondansetron (ZOFRAN) IV, mouth rinse, oxyCODONE, oxyCODONE, pentafluoroprop-tetrafluoroeth  Physical Exam Constitutional:      Appearance: She is  ill-appearing.  Pulmonary:     Effort: Pulmonary effort is normal.  Skin:    General: Skin is warm and dry.  Neurological:     Mental Status: She is alert.  Psychiatric:        Mood and Affect: Mood normal.        Behavior: Behavior normal.             Vital Signs: BP (!) 149/60 (BP Location: Right Arm)   Pulse 78   Temp 98.5  F (36.9 C) (Oral)   Resp 17   Ht 4\' 11"  (1.499 m)   Wt 74.1 kg   SpO2 (!) 88%   BMI 32.99 kg/m  SpO2: SpO2: (!) 88 % O2 Device: O2 Device: Room Air O2 Flow Rate: O2 Flow Rate (L/min): 2 L/min  Intake/output summary:  Intake/Output Summary (Last 24 hours) at 03/20/2023 1407 Last data filed at 03/20/2023 6962 Gross per 24 hour  Intake 120 ml  Output --  Net 120 ml   LBM: Last BM Date : 03/19/23 Baseline Weight: Weight: 82.1 kg Most recent weight: Weight: 74.1 kg       Palliative Assessment/Data: 40%      Patient Active Problem List   Diagnosis Date Noted  . Acute osteomyelitis of thoracic spine (HCC) 02/27/2023  . Vertebral osteomyelitis (HCC) 01/28/2023  . 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 Osteomyelitis (HCC) 06/20/2022  . Estrogen deficiency 05/25/2022  . CKD (chronic kidney disease) stage 5, GFR less than 15 ml/min (HCC) 02/14/2022  . Prolonged QT interval 01/07/2022  . Streptococcal bacteremia   . Osteomyelitis of left foot (HCC) 10/16/2021  . Diabetic foot ulcers (HCC) 10/16/2021  . Portal hypertension (HCC) 08/16/2021  . Eczema of scalp 06/27/2021  . Hypothyroidism 06/27/2021  . Pruritus 06/27/2021  . Seasonal allergic rhinitis due to pollen 02/02/2021  . PTSD (post-traumatic stress disorder)   . Osteoarthritis   . MRSA (methicillin resistant Staphylococcus aureus)   . Hypertension   . High cholesterol   . Hepatic steatosis   . GERD (gastroesophageal reflux disease)   . Fibromyalgia   . Episode of visual loss of left eye   . Depression   . Constipation   . Cataract   . Bulging lumbar disc   . Asthma   . Anxiety    . Anemia   . Tinea capitis 10/11/2020  . Tinea corporis 10/11/2020  . Hip pain 05/16/2020  . IDA (iron deficiency anemia) 05/04/2020  . 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 S/P lumbar fusion 03/04/2020  . Spinal stenosis of lumbar region 01/22/2020  . 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 Compression fracture of L1 lumbar vertebra (HCC) 11/17/2019  . Fracture of multiple ribs 11/17/2019  . Nausea & vomiting 11/15/2019  . Diarrhea 09/04/2019  . Pressure injury of skin 04/17/2019  . Septic arthritis of elbow, right (HCC) 04/17/2019  . 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 Sleep apnea 11/16/2017  . Diabetic foot infection (HCC) 04/06/2017  . Type 2 diabetes mellitus with hyperglycemia, with long-term current use of insulin (HCC) 04/06/2017  . Neuropathy 05/05/2015  . Diabetic peripheral neuropathy (HCC) 01/12/2015  . CKD (chronic kidney disease), stage III (HCC) 05/27/2014  . History of MI (myocardial infarction) 10/06/2013  . Chest pain 10/06/2013  . Nausea and vomiting 10/06/2013  . Obesity (BMI 30-39.9) 09/20/2013  . 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 Normocytic anemia 02/03/2013  . CAD (coronary artery disease) 01/11/2012  . UTI (urinary tract infection) 08/05/2010  . Hepatic cirrhosis (HCC) 07/06/2010  . 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  . Hyperlipidemia 06/03/2008  . Essential hypertension 12/23/2006  . Acute MI [facility]) 2007    Palliative Care Assessment & Plan   Patient Profile: [name] [name] is a 67 y.o. adult with medical history significant of CKD 5,diabetes, neuropathy, thrombocytopenia, OSA, osteomyelitis, diabetic foot infections, anemia, hypothyroidism, hypertension, hyperlipidemia, CAD, cirrhosis, asthma, spinal stenosis, status post lumbar fusion, status post renal transplant (age  15), PTSD, depression, anxiety, obesity, fibromyalgia, DVT, cataracts  presenting with worsening back pain, nausea, vomiting. She presented to the hospital for back pain, abdominal pain, nausea, and vomiting and was admitted on 02/26/23.   Assessment: Patient's constipation is resolved. Advised [name] [name] that we would keep her on a bowel regimen to prevent constipation from opioid use. We discussed discontinuing [name] [name]'s Fentanyl in preparation for her discharge. She will still have her PO PRN oxycodone. We discussed staying ahead of her pain at length. We also discussed using stretch and other holistic methods for pain relief (such as pet therapy with her dog when she gets home). She is excited to get home to a normal routine and be with her husband.  Recommendations/Plan: Full code / Full scope of care   Symptoms:   Chronic Pain Continue tylenol 1000 mg tid Continue diclofenac (Flector) 1.3% patch bid Continue lidocaine patch 5% 2 patches daily Continue Robaxin 750mg  tid Oxycontin 15mg  PO BID Oxycodone 5-10mg  PO Q4H PRN moderate/severe pain (administration note to use PRN PO pain medication as first line) Discontinue Fentanyl 25mg  IVP Q3H PRN breakthrough pain Continue duloxetine (Cymbalta) 60mg  daily Continue pregabalin (Lyrica) 75mg  daily Kpad to back as tolerated Will need a referral to [place] [facility] on discharge - Dr. Laban Emperor if possible   Nausea Continue zofran 4mg  IV q6hr prn   Anxiety Continue duloxetine (Cymbalta) 60mg  daily Continue pregabalin (Lyrica) 75mg  daily Continue diazepam (Valium) 5 mg q12hr prn  Constipation: Senna 2 Tabs PO BID x2 days (Started 5/18) - Using great caution in the setting of C.Diff status Miralax PO BID  Goals of Care and Additional Recommendations: Limitations on Scope of Treatment: Full Scope Treatment  Code Status:    Code Status Orders  (From admission, onward)           Start     Ordered   02/26/23 1448  Full code  Continuous       Question:  By:  Answer:  Consent:  discussion documented in EHR   02/26/23 1449           Code Status History     Date Active Date Inactive Code Status Order ID Comments User Context   01/28/2023 2336 02/04/2023 1730 Full Code [id number]  John Giovanni, MD ED   06/21/2022 0025 07/11/2022 2159 Full Code [id number]  Carollee Herter, DO ED   01/07/2022 0511 01/09/2022 2045 Full Code [id number]  Howerter, Chaney Born, DO Inpatient   10/17/2021 0420 10/22/2021 1928 Full Code [id number]  Chotiner, Claudean Severance, MD ED   02/18/2020 2241 02/26/2020 2334 Full Code [id number]  Lewie Chamber, MD Inpatient   01/29/2020 1704 02/18/2020 2044 Full Code [id number]  Charlton Amor, PA-C Inpatient   01/29/2020 1704 01/29/2020 1704 Full Code [id number]  Charlton Amor, PA-C Inpatient   01/22/2020 2141 01/29/2020 1650 Full Code [id number]  Bedelia Person, MD Inpatient   01/21/2020 0012 01/22/2020 2141 Full Code [id number]  Eduard Clos, MD Inpatient   11/15/2019 2200 11/18/2019 2226 Full Code [id number]  Pearson Grippe, MD ED   04/15/2019 2122 04/21/2019 2320 Full Code [id number]  Rometta Emery, MD Inpatient   04/06/2017 2004 04/11/2017 2109 Full Code [id number]  Alberteen Sam, MD Inpatient   09/29/2016 1831 10/02/2016 1643 Full Code [id number]  Nadara Mustard, MD Inpatient   03/19/2013 1507 03/21/2013 1812 Full Code [id number]  Nadara Mustard, MD Inpatient   02/10/2013 2124 02/20/2013 1859 Full Code [id number]  Nadara Mustard, MD Inpatient  02/03/2013 2137 02/10/2013 2124 Full Code [id number]  Cristal Ford, MD Inpatient   01/11/2012 0612 01/13/2012 1645 Full Code [id number]  Wilder Glade, RN Inpatient       Prognosis:  Unable to determine  Discharge Planning: To Be Determined  Care plan was discussed with Dr. Allena Katz and bedside RN Pieter Partridge.  Time spent: 45 minutes  Thank you for allowing the Palliative Medicine Team to assist in the care of this patient.    Sherryll Burger, NP  Please contact Palliative Medicine Team phone at [phone] for questions  and concerns.

## 2023-03-20 NOTE — Progress Notes (Signed)
Pt's out-pt HD schedule has been changed. Pt preferred a MWF appt and clinic has had an appt open today. Pt's schedule will be MWF 11:45 am chair time. Pt will need to arrive at 11:15 for first appt to complete paperwork prior to treatment. Message left for pt on voicemail requesting a return call. Spoke to pt's husband via phone to make him aware of changes. Pt's husband agreeable to plan. AVS updated with new schedule/times. Update provided to attending, nephrologist, pt's RN, RN CM, and CSW. Will assist as needed.   Olivia Canter Renal Navigator [phone]

## 2023-03-20 NOTE — TOC CM/SW Note (Signed)
Followed up with patient to see if her husband completed , access [place] application , he has not. Patient will have husband complete application tonight when he visits

## 2023-03-20 NOTE — Progress Notes (Signed)
[facility] Progress Note Patient: Shadow Letner ZOX:[id number] DOB: [birth year] DOA: 02/26/2023  DOS: the patient was seen and examined on 03/20/2023  Brief hospital course: PMH of type II DM, neuropathy, thrombocytopenia, OSA, osteomyelitis, hypothyroidism, HTN, CAD, cirrhosis, ESRD now on HD, spinal stenosis SP lumbar fusion, history of renal transplant, PTSD, depression, obesity, fibromyalgia, low-dose metoprolol doses.  BMI baseline.  Presented to the hospital with complaints of back pain.  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  4/29 admitted to the hospital.  MRI controllable Looking for vertebral body, new marrow edema T3.  7 mm epidural fluid collection. 5/2 nephrology was consulted for AKI on CKD 5. 5/3 underwent IR guided T5 bone biopsy and T4-T5 disc aspiration.  IR also placed temp HD catheter for HD. 5/12 vascular surgery was consulted. 5/13 left TDC placement with vascular surgery. 5/14 palliative care consulted for pain management 5/15 underwent aVF placement left brachial cephalic. Had a Fall in the OR while being transferred from the OR table. 5/17 Accepted at [facility] SW GBO TTS pending pt's ability to sit for extended period of time. 5/18 1 PRBC transfusion ordered Assessment and Plan: Acute osteomyelitis discitis thoracic spine persistent at T4, T5 Presents with back pain. MRI T spine 4/30 Persistent T4-T5 discitis/osteomyelitis, In comparison to January 28, 2023, mild interval collapse in the T4 vertebral body. Mild new marrow edema at T3 is new and likely represents osteomyelitis. Mild interval increase in size of a suspected dorsal epidural fluid collection measuring 7 mm, possibly abscess or phlegmon. Negative blood culture 4/29 Biopsy results no growth to date from aspiration Per ID-complete IV daptomycin 04/13/2023, OP F/u 04/12/2023  Pain control Chronic anxiety Appreciate palliative care assistance. Prior to admission patient is on Norco 5-325 4 times daily as  needed. Has been on Valium as needed in the past. Also was on Lyrica 75 mg 3 times daily. Pain control per palliative care.  Currently on long-acting OxyContin and oxycodone.  Need to tolerate HD in a recliner with manageable pain.  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  Serum creatinine peaked at 8 leading to start her on HD.  5/3. TDC placement 5/13. AVF placement 5/15. Will need to be able to tolerate HD in recliner for outpatient HD.  Was able to tolerate HD on 5/20 but had significant pain after that.  Will monitor pain regimen. She has been informed that if the Wednesday HD goes well she might be close to medically ready for being discharged from the hospital.   Type 2 diabetes mellitus, uncontrolled with hyperglycemia with nephropathy. 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 Hemoglobin A1c less than 5.   Diarrhea C. difficile antigen positive but toxigenic PCR negative. Constipation Patient had diarrhea workup showed that the patient's stool is positive for C. difficile antigen. She was given Questran with resultant constipation. Now started on bowel regimen.   Urinary Retention. Foley Catheter Placed on 5/2, removed on 5/9, required replacement on 5/10. No prior history of urinary retention.  Unable to tolerate PureWick due to allergic reaction to some substance in PureWick. Foley catheter removed on 5/20.   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  Monitor.   Normocytic anemia Iron studies 5/3 did not show any deficiency and saturations are good Hemoglobin dropped down to 6.8 on 5/18.  Ordered 1 PRBC transfusion.  No bleeding noted.  Will recheck CBC tomorrow.  Already  receiving IV iron as well as EPO support.   Chronic HFpEF EF 65-70%.  Appears euvolemic. Volume management with HD.   HTN Monitor for now. Blood pressure  soft.   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 Patient had a fall in the OR while being transferred from the OR table to the stretcher. CT chest abdomen pelvis shows no evidence of acute trauma. CT head negative as well. X-ray shoulder of the left arm negative for any acute fracture. Currently has a sling for pain control.   Subjective: Pain remains unchanged.  No nausea no vomiting no fever no chills.  Reports for BM so far.  Physical Exam: In mild distress. Clear to auscultation. S1-S2 present. Bowel sound present.  Nontender. No edema.  Data Reviewed: I have Reviewed nursing notes, Vitals, and Lab results. Since last encounter, pertinent lab results CBC and BMP   . I have ordered test including CBC and BMP  . I have discussed pt's care plan and test results with palliative care  .   Disposition: Status is: Inpatient Remains inpatient appropriate because: Awaiting ability to sit in the recliner for HD.  apixaban (ELIQUIS) tablet 2.5 mg Start: 03/15/23 1145 Place and maintain sequential compression device Start: 03/01/23 1536 apixaban (ELIQUIS) tablet 2.5 mg   Family Communication: No one at bedside Level of care: Med-Surg   Vitals:   03/20/23 0729 03/20/23 0836 03/20/23 1835 03/20/23 2012  BP:  (!) 149/60 119/76 134/67  Pulse: 89 78 84 83  Resp: 16 17 18    Temp:  98.5 F (36.9 C) 98 F (36.7 C) 97.8 F (36.6 C)  TempSrc:  Oral Oral Oral  SpO2: 93% (!) 88% 99% 92%  Weight:      Height:         Author: Lynden Oxford, MD 03/20/2023 8:20 PM  Please look on [email or web address] to find out who is on call.

## 2023-03-20 NOTE — Progress Notes (Signed)
[facility] Progress Note   Assessment/ Plan:   New ESRD: Baseline creatinine was around 4.5 prior to admission.  AKI likely ATN.  Did require dialysis earlier in the hospitalization.  Had some improvement in kidney function as well as urine output and dialysis catheter was removed.  Now with persistent low urine output and rising creatinine most likely ESRD at this point  -[facility] placement 03/12/23, appreciate assistance - CLIP in process - Appreciate lt BCF by Dr. Chestine Spore on 5/15 -Continue to monitor daily Cr, Dose meds for GFR -Monitor Daily I/Os, Daily weight  -Maintain MAP>65 for optimal renal perfusion.  -Avoid nephrotoxic medications including NSAIDs -Use synthetic opioids (Fentanyl/Dilaudid) if needed  She tolerated HD in the chair on 5/20; plan next HD Wed on MWF regimen. She's trying to get the chair moved from AF TTS 1130AM chair time to MWF   Anemia: Likely multifactorial with inflammation and CKD contributing.  Increased ESA to 100 mcg weekly.  Iron replete.   HFpEF: Volume status overall acceptable.  Continue to monitor   Osteomyelitis: On daptomycin.  Per primary team -> final dose on 6/15.   DM2: Management per primary   CKD BMD: phos acceptable (not sure if I believe the 2.3). Ca corrects near normal. F/u PTH    Subjective:    Seen in room- still in pain. Has had multiple back surgeries. Very fatigued.   Objective:   BP (!) 149/60 (BP Location: Right Arm)   Pulse 78   Temp 98.5 F (36.9 C) (Oral)   Resp 17   Ht 4\' 11"  (1.499 m)   Wt 74.1 kg   SpO2 (!) 88%   BMI 32.99 kg/m   Physical Exam: Gen:NAD CVS: RRR Resp: clear Abd: soft Ext: no  LE edema ACCESS: LIJ TDC, L BCF + T/B  Labs: BMET Recent Labs  Lab 03/14/23 0355 03/14/23 1341 03/15/23 0029 03/16/23 0326 03/17/23 0420 03/18/23 0421 03/19/23 0427 03/20/23 0248  NA 137 140 134* 131* 135 133* 131* 132*  K 4.2 5.1 3.9 3.9 3.9 4.0 4.1 3.7  CL 103 103 97* 95* 98 97* 96* 96*  CO2  25  --  29 27 30 27 27 27   GLUCOSE 108* 95 82 83 82 78 97 98  BUN 40* 39* 16 29* 15 26* 43* 25*  CREATININE 3.84* 4.60* 2.06* 3.53* 2.39* 3.39* 3.84* 2.70*  CALCIUM 7.6*  --  8.2* 8.5* 8.3* 8.2* 8.0* 8.3*  PHOS 3.9  --  2.6 3.5 2.6 3.8 3.7 2.3*   CBC Recent Labs  Lab 03/17/23 1700 03/18/23 0421 03/19/23 0427 03/20/23 0248  WBC 4.4 4.3 5.0 5.9  HGB 8.4* 8.2* 7.7* 7.7*  HCT 28.3* 26.7* 25.7* 25.5*  MCV 95.0 95.0 93.8 94.4  PLT 109* 115* 117* 114*      Medications:     acetaminophen  1,000 mg Oral TID   acidophilus  2 capsule Oral TID   apixaban  2.5 mg Oral BID   calcium carbonate  1 tablet Oral Daily   Chlorhexidine Gluconate Cloth  6 each Topical Q0600   darbepoetin (ARANESP) injection - NON-DIALYSIS  100 mcg Subcutaneous Q Fri-1800   diclofenac  1 patch Transdermal BID   DULoxetine  60 mg Oral Daily   feeding supplement (NEPRO CARB STEADY)  237 mL Oral TID BM   furosemide  80 mg Oral BID   lidocaine  2 patch Transdermal Q24H   methocarbamol  750 mg Oral TID   mometasone-formoterol  2 puff  Inhalation BID   oxyCODONE  15 mg Oral Q12H   polyethylene glycol  17 g Oral BID   pregabalin  75 mg Oral Daily   senna-docusate  2 tablet Oral BID   sodium chloride flush  10-40 mL Intracatheter Q12H   sodium chloride flush  3 mL Intravenous Q12H

## 2023-03-21 DIAGNOSIS — I5032 Chronic diastolic (congestive) heart failure: Secondary | ICD-10-CM

## 2023-03-21 DIAGNOSIS — E1165 Type 2 diabetes mellitus with hyperglycemia: Secondary | ICD-10-CM | POA: Diagnosis not present

## 2023-03-21 DIAGNOSIS — M4624 Osteomyelitis of vertebra, thoracic region: Secondary | ICD-10-CM | POA: Diagnosis not present

## 2023-03-21 DIAGNOSIS — F32A Depression, unspecified: Secondary | ICD-10-CM | POA: Diagnosis not present

## 2023-03-21 DIAGNOSIS — I82503 Chronic embolism and thrombosis of unspecified deep veins of lower extremity, bilateral: Secondary | ICD-10-CM | POA: Diagnosis not present

## 2023-03-21 DIAGNOSIS — N186 End stage renal disease: Secondary | ICD-10-CM

## 2023-03-21 DIAGNOSIS — R197 Diarrhea, unspecified: Secondary | ICD-10-CM

## 2023-03-21 DIAGNOSIS — Z992 Dependence on renal dialysis: Secondary | ICD-10-CM

## 2023-03-21 DIAGNOSIS — D696 Thrombocytopenia, unspecified: Secondary | ICD-10-CM

## 2023-03-21 LAB — RENAL FUNCTION PANEL
Albumin: 2.5 g/dL — ABNORMAL LOW (ref 3.5–5.0)
Anion gap: 7 (ref 5–15)
BUN: 39 mg/dL — ABNORMAL HIGH (ref 8–23)
CO2: 27 mmol/L (ref 22–32)
Calcium: 8 mg/dL — ABNORMAL LOW (ref 8.9–10.3)
Chloride: 94 mmol/L — ABNORMAL LOW (ref 98–111)
Creatinine, Ser: 3.52 mg/dL — ABNORMAL HIGH (ref 0.44–1.00)
GFR, Estimated: 14 mL/min — ABNORMAL LOW (ref >60)
Glucose, Bld: 135 mg/dL — ABNORMAL HIGH (ref 70–99)
Phosphorus: 3.1 mg/dL (ref 2.5–4.6)
Potassium: 3.6 mmol/L (ref 3.5–5.1)
Sodium: 128 mmol/L — ABNORMAL LOW (ref 135–145)

## 2023-03-21 LAB — CBC
HCT: 23.8 % — ABNORMAL LOW (ref 36.0–46.0)
Hemoglobin: 7.2 g/dL — ABNORMAL LOW (ref 12.0–15.0)
MCH: 29 pg (ref 26.0–34.0)
MCHC: 30.3 g/dL (ref 30.0–36.0)
MCV: 96 fL (ref 80.0–100.0)
Platelets: 103 10*3/uL — ABNORMAL LOW (ref 150–400)
RBC: 2.48 MIL/uL — ABNORMAL LOW (ref 3.87–5.11)
RDW: 16.5 % — ABNORMAL HIGH (ref 11.5–15.5)
WBC: 4.4 10*3/uL (ref 4.0–10.5)
nRBC: 0 % (ref 0.0–0.2)

## 2023-03-21 LAB — GLUCOSE, CAPILLARY: Glucose-Capillary: 116 mg/dL — ABNORMAL HIGH (ref 70–99)

## 2023-03-21 MED ORDER — CALCITRIOL 0.5 MCG PO CAPS
0.5000 ug | ORAL_CAPSULE | Freq: Every day | ORAL | Status: DC
  Administered 2023-03-21 – 2023-04-03 (×13): 0.5 ug via ORAL
  Filled 2023-03-21 (×15): qty 1

## 2023-03-21 NOTE — Progress Notes (Signed)
OT Cancellation Note  Patient Details Name: [name] [name] MRN: [id number] DOB: 05-01-1956   Cancelled Treatment:     OT attempting to complete OT treatment but patient refusing and stated " I am going downstairs and they are going to look at my back"  Spoke with bedside RN, [place] who reported that she was going to dialysis.  OT will re-attempt as able  Denice Paradise 03/21/2023, 1:11 PM

## 2023-03-21 NOTE — Progress Notes (Signed)
PROGRESS NOTE    Hedy Siegrist  WUJ:[id number] DOB: 02-Feb-1956 DOA: 02/26/2023 PCP: Donato Schultz, DO    Chief Complaint  Patient presents with   Abdominal Pain   Back Pain    Brief Narrative: PMH of type II DM, neuropathy, thrombocytopenia, OSA, osteomyelitis, hypothyroidism, HTN, CAD, cirrhosis, ESRD now on HD, spinal stenosis SP lumbar fusion, history of renal transplant, PTSD, depression, obesity, fibromyalgia, low-dose metoprolol doses.  BMI baseline.  Presented to the hospital with complaints of back pain.  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  4/29 admitted to the hospital.  MRI controllable Looking for vertebral body, new marrow edema T3.  7 mm epidural fluid collection. 5/2 nephrology was consulted for AKI on CKD 5. 5/3 underwent IR guided T5 bone biopsy and T4-T5 disc aspiration.  IR also placed temp HD catheter for HD. 5/12 vascular surgery was consulted. 5/13 left TDC placement with vascular surgery. 5/14 palliative care consulted for pain management 5/15 underwent aVF placement left brachial cephalic. Had a Fall in the OR while being transferred from the OR table. 5/17 Accepted at [facility] SW G[facility] TTS pending pt's ability to sit for extended period of time. 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 -Imaging of MRI T-spine done 02/27/2023 with persistent T4-T5 discitis/osteomyelitis, in comparison to January 28, 2023, mild interval collapse in the T4 vertebral body.  Mild new marrow edema at T3 is new and likely represents osteomyelitis.  Mild interval increase in size of suspected dorsal epidural fluid collection measuring 7 mm, possibly abscess or phlegmon. -Blood cultures obtained 02/26/2023 were negative. -IR consulted patient underwent biopsy with no growth to date from aspiration. -Patient seen in consultation by ID who recommended IV daptomycin through 04/13/2023 with outpatient follow-up scheduled 04/12/2023.  2.  Pain control/chronic anxiety -Patient assessed by palliative care, prior to admission patient noted to be on Norco 5/325 4 times daily as needed also noted to have been on Valium before in the past.  Patient also noted on Lyrica 75 mg 3 times daily. -Patient seen by palliative care who are managing patient's pain. -Patient currently on OxyContin 15 mg p.o. twice daily, oxycodone 5 to 10 mg p.o. every 4 hours as needed breakthrough pain, Cymbalta 60 mg daily, Lyrica 75  mg daily, scheduled Tylenol 1000 mg 3 times daily, diclofenac 1.3% patch twice daily, lidocaine patch 5% 2 patches daily, Robaxin-750 milligrams 3 times daily.  K-pad as tolerated. -Fentanyl discontinued by palliative care. -Palliative care recommending that patient will need a referral to [facility] health  interventional pain specialist on discharge, Dr.Naveira if possible. -Per palliative care.  3.  ATN/AKI on CKD 5>>>> ESRD -Patient noted to have a baseline creatinine around 4, serum creatinine peaked at 8 leading to initiation of HD 5/3. -TDC placed 03/12/2023 -AVF placement 03/14/2023 -Patient will need to be able to tolerate HD in recliner for outpatient HD, noted to have been able to tolerate HD on 520 but has significant pain after that. -It is noted that patient was informed if hemodialysis went well on Wednesday she might be close to being discharged from the hospital. -Nephrology following and appreciate input and recommendations.  4.  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 -Hemoglobin A1c 4.6 (02/14/2022) -Repeat hemoglobin A1c -CBG 116 this morning -Follow.  5.  Diarrhea/C. difficile antigen positive but toxigenic PCR negative/constipation -Patient noted to have had diarrhea workup which showed that patient was to was positive for C. difficile antigen.  Toxigenic PCR negative. -Patient noted to have received question which resulted in constipation. -Currently on bowel regimen of Colace twice daily,  6.  Urinary retention -Patient noted to have urinary retention early on in the hospitalization had Foley catheter placed 5/2 which was removed on 5/9 and required placement again on 5/10, no prior history of urinary retention. -Patient also noted now ESRD on HD. -Foley catheter removed 03/19/2023. -Urine output recorded 400 cc over the past 24 hours however unsure how accurate urine output is. -Bladder scan done earlier this morning with 80 cc, bladder scan done this afternoon with 130 cc. -Monitor urine output.  7.  Left lower extremity wound -Continue current wound care dressings with Aquacel, wound care RN following. Pressure Injury 02/26/23 Heel Left (Active)  02/26/23 1826   Location: Heel  Location Orientation: Left  Staging:   Wound Description (Comments):   Present on Admission: Yes   8 chronic thrombocytopenia -Found secondary to cirrhosis. -No overt bleeding.  9.  Normocytic anemia -Iron studies consistent with anemia of chronic disease. -Hemoglobin noted to have dropped down to 6.8 on 5/18, status post transfusion 1 unit PRBCs. -Hemoglobin currently stable at 7.2. -Status post IV iron in EPO support. -Nephrology following.  10.  Chronic diastolic CHF -Volume management per hemodialysis. -Currently on Lasix 80 mg twice daily.  11.  Hypertension -BP soft. -Patient on Lasix. -Follow-up.  12.  Depression/anxiety -Continue Cymbalta, diazepam.  13.  Chronic lower extremity DVTs -Eliquis.  14.  Fall in the OR -Patient noted to have had a fall in the OR while being transferred from the OR table to the stretcher. -CT chest abdomen and pelvis with no acute trauma noted. -CT head negative. -Plain films of the left shoulder negative for any fracture. -Currently in sling for pain control.    DVT prophylaxis: Eliquis Code Status: Full Family Communication: Updated patient.  No family at bedside. Disposition: Awaiting ability to sit in recliner for hemodialysis/home with home health therapies  Status is: Inpatient Remains inpatient appropriate because: Severity of illness   Consultants:  ID: Dr. [name] 02/10/2023 Wound care RN IR: Dr.De Melchor Amour 02/28/2023 Nephrology: Dr. Glenna Fellows 03/01/2023 Vascular surgery: Dr. Randie Heinz 03/11/2023 Palliative care: Dr. Linna Darner 03/13/2023  Procedures:  CT head 03/14/2023 Chest x-ray 03/12/2023, Placement of tunneled left-sided hemodialysis catheter: Per IR: Dr. Fredia Sorrow 03/12/2023 Plain films of left shoulder 03/14/2023 MRI T-spine 03/29/2023 Placement of left jugular nontunneled dialysis catheter using ultrasound and fluoroscopic guidance: Per IR, Dr. Lowella Dandy 03/02/2023 Renal ultrasound 03/01/2023 Upper extremity  vein mapping 03/11/2023 Fluoroscopy guided T5 core biopsy and T4-5 disc aspiration: Per Dr.Katyucia, IR, 03/02/2023 Placement of left jugular type lumen dialysis catheter: Per IR, Dr. Lowella Dandy 03/02/2023 Left brachiocephalic AV fistula placement: Per vascular surgery: Dr. Chestine Spore 03/14/2023 Ultrasound-guided access left internal jugular vein, placement of left internal jugular vein tunneled dialysis catheter: Per Dr. Chestine Spore 03/12/2023 1 unit PRBC transfusion 03/17/2023  Antimicrobials: Anti-infectives (From admission, onward)    Start     Dose/Rate Route Frequency Ordered Stop   03/14/23 0000  vancomycin (VANCOCIN) IVPB 1000 mg/200 mL premix        1,000 mg 200 mL/hr over 60 Minutes Intravenous To Surgery 03/13/23 0731 03/14/23 1452   03/07/23 0000  daptomycin (CUBICIN) IVPB        650 mg Intravenous Every 48 hours 03/06/23 1302 04/13/23 2359   02/27/23 1630  DAPTOmycin (CUBICIN) 650 mg in sodium chloride 0.9 % IVPB        8 mg/kg  82.1 kg 126 mL/hr over 30 Minutes Intravenous Every 48 hours 02/26/23 1615     02/26/23 1145  cefTRIAXone (ROCEPHIN) 2 g in sodium chloride 0.9 % 100 mL IVPB        2 g 200 mL/hr over 30 Minutes Intravenous  Once 02/26/23 1143 02/26/23 1510         Subjective: In hemodialysis.  Sleeping but arousable.  Denies any chest pain or significant shortness of breath.  Still with some complaints of back pain.  Tolerating HD.  Objective: Vitals:   03/21/23 1430 03/21/23 1500 03/21/23 1530 03/21/23 1600  BP: (!) 93/51 107/78 94/60 (!) 103/37  Pulse: 94 94 94 95  Resp: 15 16 15 15   Temp:      TempSrc:      SpO2: 98% 98% 100% 100%  Weight:      Height:        Intake/Output Summary (Last 24 hours) at 03/21/2023 1614 Last data filed at 03/21/2023 0417 Gross per 24 hour  Intake --  Output 400 ml  Net -400 ml   Filed Weights   03/20/23 0454 03/21/23 0420  Weight: 78.1 kg 41.9 kg    Examination:  General exam: Appears calm and comfortable  Respiratory system: Clear  to auscultation anterior lung fields.[name] Kitchen Respiratory effort normal. Cardiovascular system: S1 & S2 heard, RRR. No JVD, murmurs, rubs, gallops or clicks. No pedal edema. Gastrointestinal system: Abdomen is nondistended, soft and nontender. No organomegaly or masses felt. Normal bowel sounds heard. Central nervous system: Alert and oriented. No focal neurological deficits. Extremities: Symmetric 5 x 5 power. Skin: No rashes, lesions or ulcers Psychiatry: Judgement and insight appear normal. Mood & affect appropriate.     Data Reviewed: I have personally reviewed following labs and imaging studies  CBC: Recent Labs  Lab 03/17/23 1700 03/18/23 0421 03/19/23 0427 03/20/23 0248 03/21/23 0340  WBC 4.4 4.3 5.0 5.9 4.4  HGB 8.4* 8.2* 7.7* 7.7* 7.2*  HCT 28.3* 26.7* 25.7* 25.5* 23.8*  MCV 95.0 95.0 93.8 94.4 96.0  PLT 109* 115* 117* 114* 103*    Basic Metabolic Panel: Recent Labs  Lab 03/17/23 0420 03/18/23 0421 03/19/23 0427 03/20/23 0248 03/21/23 0340  NA 135 133* 131* 132* 128*  K 3.9 4.0 4.1 3.7 3.6  CL 98 97* 96* 96* 94*  CO2 30 27 27 27 27   GLUCOSE 82 78 97 98 135*  BUN 15 26* 43* 25* 39*  CREATININE 2.39* 3.39* 3.84* 2.70* 3.52*  CALCIUM 8.3* 8.2* 8.0* 8.3* 8.0*  PHOS 2.6 3.8 3.7 2.3* 3.1    GFR: Estimated Creatinine Clearance (by C-G formula based on SCr of 3.52 mg/dL (H)) Female: 78.2 mL/min (A) Female: 12.1 mL/min (A)  Liver Function Tests: Recent Labs  Lab 03/17/23 0420 03/18/23 0421 03/19/23 0427 03/20/23 0248 03/21/23 0340  AST  --   --  14*  --   --   ALT  --   --  9  --   --   ALKPHOS  --   --  118  --   --   BILITOT  --   --  1.2  --   --   PROT  --   --  6.5  --   --   ALBUMIN 2.4* 2.5* 2.5*  2.5* 2.6* 2.5*    CBG: Recent Labs  Lab 03/20/23 1201 03/20/23 1830 03/20/23 2015 03/20/23 2339 03/21/23 0409  GLUCAP 74 142* 130* 138* 116*     No results found for this or any previous visit (from the past 240 hour(s)).       Radiology  Studies: No results found.      Scheduled Meds:  acetaminophen  1,000 mg Oral TID   acidophilus  2 capsule Oral TID   apixaban  2.5 mg Oral BID   calcitRIOL  0.5 mcg Oral Daily   calcium carbonate  1 tablet Oral Daily   Chlorhexidine Gluconate Cloth  6 each Topical Q0600   darbepoetin (ARANESP) injection - NON-DIALYSIS  100 mcg Subcutaneous Q Fri-1800   diclofenac  1 patch Transdermal BID   docusate sodium  100 mg Oral BID   DULoxetine  60 mg Oral Daily   feeding supplement (NEPRO CARB STEADY)  237 mL Oral TID BM   furosemide  80 mg Oral BID   lidocaine  2 patch Transdermal Q24H   methocarbamol  750 mg Oral TID   mometasone-formoterol  2 puff Inhalation BID   oxyCODONE  15 mg Oral Q12H   polyethylene glycol  17 g Oral Daily   pregabalin  75 mg Oral Daily   sodium chloride flush  10-40 mL Intracatheter Q12H   sodium chloride flush  3 mL Intravenous Q12H   Continuous Infusions:  DAPTOmycin (CUBICIN) 650 mg in sodium chloride 0.9 % IVPB 650 mg (03/19/23 1614)     LOS: 22 days    Time spent: 35 minutes    Ramiro Harvest, MD [facility]   To contact the attending provider between 7A-7P or the covering provider during after hours 7P-7A, please log into the web site [email or web address] and access using universal [place] password for that web site. If you do not have the password, please call the hospital operator.  03/21/2023, 4:14 PM

## 2023-03-21 NOTE — Progress Notes (Addendum)
[facility] Progress Note   Assessment/ Plan:   New ESRD: Baseline creatinine was around 4.5 prior to admission.  AKI likely ATN.  Did require dialysis earlier in the hospitalization.  Had some improvement in kidney function as well as urine output and dialysis catheter was removed.  Now with persistent low urine output and rising creatinine most likely ESRD at this point  -[facility] placement 03/12/23, appreciate assistance - CLIP to AF MWF 1145 (arrive 1115am 1st day) - Appreciate lt BCF by Dr. Chestine Spore on 5/15 -> great thrill -Continue to monitor daily Cr, Dose meds for GFR -Monitor Daily I/Os, Daily weight  -Maintain MAP>65 for optimal renal perfusion.  -Avoid nephrotoxic medications including NSAIDs -Use synthetic opioids (Fentanyl/Dilaudid) if needed  24hr urine collection as well if she's still here for residual renal function (thur-Fri).  She tolerated HD in the chair on 5/20; plan next HD today on MWF regimen.    Anemia: Likely multifactorial with inflammation and CKD contributing.  Increased ESA to 100 mcg weekly.  Iron replete.   HFpEF: Volume status overall acceptable.  Continue to monitor   Osteomyelitis: On daptomycin.  Per primary team -> final dose on 6/15.   DM2: Management per primary   CKD BMD: phos acceptable (not sure if I believe the 2.3). Ca corrects near normal.  5/11 PTH 405, Calcitriol 0.43mcg daily    Subjective:    Seen in room- still in pain but better than yesterday, mainly pain in the lower back and some in the neck and shoulder. Has had multiple back surgeries. Fatigued.   Objective:   BP (!) 125/54 (BP Location: Right Arm)   Pulse 96   Temp 99.2 F (37.3 C) (Oral)   Resp 18   Ht 4\' 11"  (1.499 m)   Wt 41.9 kg   SpO2 94%   BMI 18.66 kg/m   Physical Exam: Gen:NAD CVS: RRR Resp: clear Abd: soft Ext: no  LE edema ACCESS: LIJ TDC, L BCF + T/B  Labs: BMET Recent Labs  Lab 03/15/23 0029 03/16/23 0326 03/17/23 0420 03/18/23 0421  03/19/23 0427 03/20/23 0248 03/21/23 0340  NA 134* 131* 135 133* 131* 132* 128*  K 3.9 3.9 3.9 4.0 4.1 3.7 3.6  CL 97* 95* 98 97* 96* 96* 94*  CO2 29 27 30 27 27 27 27   GLUCOSE 82 83 82 78 97 98 135*  BUN 16 29* 15 26* 43* 25* 39*  CREATININE 2.06* 3.53* 2.39* 3.39* 3.84* 2.70* 3.52*  CALCIUM 8.2* 8.5* 8.3* 8.2* 8.0* 8.3* 8.0*  PHOS 2.6 3.5 2.6 3.8 3.7 2.3* 3.1   CBC Recent Labs  Lab 03/18/23 0421 03/19/23 0427 03/20/23 0248 03/21/23 0340  WBC 4.3 5.0 5.9 4.4  HGB 8.2* 7.7* 7.7* 7.2*  HCT 26.7* 25.7* 25.5* 23.8*  MCV 95.0 93.8 94.4 96.0  PLT 115* 117* 114* 103*      Medications:     acetaminophen  1,000 mg Oral TID   acidophilus  2 capsule Oral TID   apixaban  2.5 mg Oral BID   calcium carbonate  1 tablet Oral Daily   Chlorhexidine Gluconate Cloth  6 each Topical Q0600   darbepoetin (ARANESP) injection - NON-DIALYSIS  100 mcg Subcutaneous Q Fri-1800   diclofenac  1 patch Transdermal BID   docusate sodium  100 mg Oral BID   DULoxetine  60 mg Oral Daily   feeding supplement (NEPRO CARB STEADY)  237 mL Oral TID BM   furosemide  80 mg Oral BID  lidocaine  2 patch Transdermal Q24H   methocarbamol  750 mg Oral TID   mometasone-formoterol  2 puff Inhalation BID   oxyCODONE  15 mg Oral Q12H   polyethylene glycol  17 g Oral Daily   pregabalin  75 mg Oral Daily   sodium chloride flush  10-40 mL Intracatheter Q12H   sodium chloride flush  3 mL Intravenous Q12H

## 2023-03-21 NOTE — Progress Notes (Signed)
Daily Progress Note   Patient Name: [name] [name]       Date: 03/21/2023 DOB: [birth year]  Age: 67 y.o. MRN#: [id number] Attending Physician: Rodolph Bong, MD Primary Care Physician: Zola Button, Grayling Congress, DO Admit Date: 02/26/2023  Reason for Consultation/Follow-up: Establishing goals of care and Pain control  Subjective: Patient was laying in bed. She says she is in a lot of pain in her neck and shoulders.  Length of Stay: 22  Current Medications: Scheduled Meds:   acetaminophen  1,000 mg Oral TID   acidophilus  2 capsule Oral TID   apixaban  2.5 mg Oral BID   calcium carbonate  1 tablet Oral Daily   Chlorhexidine Gluconate Cloth  6 each Topical Q0600   darbepoetin (ARANESP) injection - NON-DIALYSIS  100 mcg Subcutaneous Q Fri-1800   diclofenac  1 patch Transdermal BID   docusate sodium  100 mg Oral BID   DULoxetine  60 mg Oral Daily   feeding supplement (NEPRO CARB STEADY)  237 mL Oral TID BM   furosemide  80 mg Oral BID   lidocaine  2 patch Transdermal Q24H   methocarbamol  750 mg Oral TID   mometasone-formoterol  2 puff Inhalation BID   oxyCODONE  15 mg Oral Q12H   polyethylene glycol  17 g Oral Daily   pregabalin  75 mg Oral Daily   sodium chloride flush  10-40 mL Intracatheter Q12H   sodium chloride flush  3 mL Intravenous Q12H    Continuous Infusions:  DAPTOmycin (CUBICIN) 650 mg in sodium chloride 0.9 % IVPB 650 mg (03/19/23 1614)    PRN Meds: albuterol, alteplase, diazepam, heparin, hydrALAZINE, lidocaine (PF), lidocaine-prilocaine, naLOXone (NARCAN)  injection, ondansetron (ZOFRAN) IV, mouth rinse, oxyCODONE, oxyCODONE, pentafluoroprop-tetrafluoroeth  Physical Exam Constitutional:      Appearance: She is ill-appearing.  HENT:     Mouth/Throat:      Mouth: Mucous membranes are dry.  Pulmonary:     Effort: Pulmonary effort is normal.  Neurological:     Mental Status: She is alert.  Psychiatric:        Mood and Affect: Mood normal.        Behavior: Behavior normal.             Vital Signs: BP (!) 125/54 (BP Location: Right Arm)  Pulse 96   Temp 99.2 F (37.3 C) (Oral)   Resp 18   Ht 4\' 11"  (1.499 m)   Wt 41.9 kg   SpO2 94%   BMI 18.66 kg/m  SpO2: SpO2: 94 % O2 Device: O2 Device: Room Air O2 Flow Rate: O2 Flow Rate (L/min): 2 L/min  Intake/output summary:  Intake/Output Summary (Last 24 hours) at 03/21/2023 6962 Last data filed at 03/21/2023 0417 Gross per 24 hour  Intake 240 ml  Output 400 ml  Net -160 ml   LBM: Last BM Date : 03/20/23 Baseline Weight: Weight: 82.1 kg Most recent weight: Weight: 41.9 kg       Palliative Assessment/Data: 40%      Patient Active Problem List   Diagnosis Date Noted   Acute osteomyelitis of thoracic spine (HCC) 02/27/2023   Vertebral osteomyelitis (HCC) 01/28/2023   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   Osteomyelitis (HCC) 06/20/2022   Estrogen deficiency 05/25/2022   CKD (chronic kidney disease) stage 5, GFR less than 15 ml/min (HCC) 02/14/2022   Prolonged QT interval 01/07/2022   Streptococcal bacteremia    Osteomyelitis of left foot (HCC) 10/16/2021   Diabetic foot ulcers (HCC) 10/16/2021   Portal hypertension (HCC) 08/16/2021   Eczema of scalp 06/27/2021   Hypothyroidism 06/27/2021   Pruritus 06/27/2021   Seasonal allergic rhinitis due to pollen 02/02/2021   PTSD (post-traumatic stress disorder)    Osteoarthritis    MRSA (methicillin resistant Staphylococcus aureus)    Hypertension    High cholesterol    Hepatic steatosis    GERD (gastroesophageal reflux disease)    Fibromyalgia    Episode of visual loss of left eye    Depression    Constipation    Cataract    Bulging lumbar disc    Asthma    Anxiety    Anemia    Tinea capitis 10/11/2020   Tinea corporis  10/11/2020   Hip pain 05/16/2020   IDA (iron deficiency anemia) 05/04/2020   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) 03/16/2020   S/P lumbar fusion 03/04/2020   Spinal stenosis of lumbar region 01/22/2020   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) 01/21/2020   Compression fracture of L1 lumbar vertebra (HCC) 11/17/2019   Fracture of multiple ribs 11/17/2019   Nausea & vomiting 11/15/2019   Diarrhea 09/04/2019   Pressure injury of skin 04/17/2019   Septic arthritis of elbow, right (HCC) 04/17/2019   Retinopathy 04/17/2019   Renal transplant, 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   Sleep apnea 11/16/2017   Diabetic foot infection (HCC) 04/06/2017   Type 2 diabetes mellitus with hyperglycemia, with long-term current use of insulin (HCC) 04/06/2017   Neuropathy 05/05/2015   Diabetic peripheral neuropathy (HCC) 01/12/2015   CKD (chronic kidney disease), stage III (HCC) 05/27/2014   History of MI (myocardial infarction) 10/06/2013   Chest pain 10/06/2013   Nausea and vomiting 10/06/2013   Obesity (BMI 30-39.9) 09/20/2013   Multinodular goiter 04/17/2013   Normocytic anemia 02/03/2013   CAD (coronary artery disease) 01/11/2012   UTI (urinary tract infection) 08/05/2010   Hepatic cirrhosis (HCC) 07/06/2010   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   Hyperlipidemia 06/03/2008   Essential hypertension 12/23/2006   Acute MI (HCC) 2007    Palliative Care Assessment & Plan   Patient Profile: [name] [name] is a 67 y.o. adult with medical history significant of CKD 5,diabetes, neuropathy, thrombocytopenia, OSA, osteomyelitis, diabetic foot infections, anemia, hypothyroidism, hypertension, hyperlipidemia, CAD, cirrhosis, asthma, spinal stenosis, status post lumbar fusion,  status post renal transplant (age 58), PTSD, depression, anxiety, obesity, fibromyalgia, DVT, cataracts presenting with worsening back pain, nausea, vomiting. She presented to the hospital for back pain,  abdominal pain, nausea, and vomiting and was admitted on 02/26/23.   Assessment: [name] [name] reports a lot of pain this am and looks uncomfortable. I asked her if she had requested PRN pain medication and she said the RN was going to bring it before dialysis. I encouraged her to ask for her PRN. We discussed how she will manage her pain once she is discharged. I am hopeful that with her husband helping her with her medications at home she will have easier access to pain medications when they are needed. I encouraged her to take her PRN pain medication before dialysis.   Maimouna states her bowel movements are improved.   She anticipates discharge this week.  Recommendations/Plan: Full code / Full scope of care   Symptoms:   Chronic Pain Continue tylenol 1000 mg tid Continue diclofenac (Flector) 1.3% patch bid Continue lidocaine patch 5% 2 patches daily Continue Robaxin 750mg  tid Oxycontin 15mg  PO BID Oxycodone 5-10mg  PO Q4H PRN moderate/severe pain (administration note to use PRN PO pain medication as first line) Discontinue Fentanyl 25mg  IVP Q3H PRN breakthrough pain Continue duloxetine (Cymbalta) 60mg  daily Continue pregabalin (Lyrica) 75mg  daily Kpad to back as tolerated Will need a referral to [place] [facility] on discharge - Dr. Laban Emperor if possible   Nausea Continue zofran 4mg  IV q6hr prn   Anxiety Continue duloxetine (Cymbalta) 60mg  daily Continue pregabalin (Lyrica) 75mg  daily Continue diazepam (Valium) 5 mg q12hr prn   Constipation: Senna 2 Tabs PO BID x2 days (Started 5/18) - Using great caution in the setting of C.Diff status Miralax PO BID  Goals of Care and Additional Recommendations: Limitations on Scope of Treatment: Full Scope Treatment  Code Status:    Code Status Orders  (From admission, onward)           Start     Ordered   02/26/23 1448  Full code  Continuous       Question:  By:  Answer:  Consent: discussion documented in EHR    02/26/23 1449           Code Status History     Date Active Date Inactive Code Status Order ID Comments User Context   01/28/2023 2336 02/04/2023 1730 Full Code [id number]  John Giovanni, MD ED   06/21/2022 0025 07/11/2022 2159 Full Code [id number]  Carollee Herter, DO ED   01/07/2022 0511 01/09/2022 2045 Full Code [id number]  Howerter, Chaney Born, DO Inpatient   10/17/2021 0420 10/22/2021 1928 Full Code [id number]  Chotiner, Claudean Severance, MD ED   02/18/2020 2241 02/26/2020 2334 Full Code [id number]  Lewie Chamber, MD Inpatient   01/29/2020 1704 02/18/2020 2044 Full Code [id number]  Charlton Amor, PA-C Inpatient   01/29/2020 1704 01/29/2020 1704 Full Code [id number]  Charlton Amor, PA-C Inpatient   01/22/2020 2141 01/29/2020 1650 Full Code [id number]  Bedelia Person, MD Inpatient   01/21/2020 0012 01/22/2020 2141 Full Code [id number]  Eduard Clos, MD Inpatient   11/15/2019 2200 11/18/2019 2226 Full Code [id number]  Pearson Grippe, MD ED   04/15/2019 2122 04/21/2019 2320 Full Code [id number]  Rometta Emery, MD Inpatient   04/06/2017 2004 04/11/2017 2109 Full Code [id number]  Alberteen Sam, MD Inpatient   09/29/2016 1831 10/02/2016 1643 Full Code [id number]  Nadara Mustard, MD Inpatient   03/19/2013 [phone]  03/21/2013 1812 Full Code [id number]  Nadara Mustard, MD Inpatient   02/10/2013 2124 02/20/2013 1859 Full Code [id number]  Nadara Mustard, MD Inpatient   02/03/2013 2137 02/10/2013 2124 Full Code [id number]  Cristal Ford, MD Inpatient   01/11/2012 0612 01/13/2012 1645 Full Code [id number]  Wilder Glade, RN Inpatient       Prognosis:  Unable to determine  Discharge Planning: To Be Determined   Time spent: 40 min  Detailed review of medical records ( labs, imaging, vital signs), medically appropriate exam, counseling and education to patient, documenting clinical information, medication management, coordination of care.  Thank you for allowing the Palliative Medicine Team to assist in the care  of this patient.   Sherryll Burger, NP  Please contact Palliative Medicine Team phone at [phone] for questions and concerns.

## 2023-03-21 NOTE — Progress Notes (Signed)
   03/21/23 1720  Vitals  Temp 98.4 F (36.9 C)  Pulse Rate 100  Resp 16  BP 105/65  SpO2 99 %  O2 Device Nasal Cannula  Oxygen Therapy  O2 Flow Rate (L/min) 2 L/min  Patient Activity (if Appropriate) In bed  Pulse Oximetry Type Continuous  Post Treatment  Dialyzer Clearance Lightly streaked  Duration of HD Treatment -hour(s) 3.75 hour(s)  Hemodialysis Intake (mL) 0 mL  Liters Processed 90  Fluid Removed (mL) 1000 mL  Tolerated HD Treatment Yes   Received patient in bed to unit.  Alert and oriented.  Informed consent signed and in chart.   TX duration:3.75hrs  Patient tolerated well.  Transported back to the room  Alert, without acute distress.  Hand-off given to patient's nurse.   Access used: [facility] Access issues: none  Total UF removed: 1L Medication(s) given: none   [facility]

## 2023-03-21 NOTE — Progress Notes (Signed)
PT Cancellation Note  Patient Details Name: [name] [name] MRN: [id number] DOB: [birth year]   Cancelled Treatment:    Reason Eval/Treat Not Completed: Patient at procedure or test/unavailable (Pt at dialysis, will follow up as able.)   Johny Shock 03/21/2023, 3:27 PM

## 2023-03-22 DIAGNOSIS — M4624 Osteomyelitis of vertebra, thoracic region: Secondary | ICD-10-CM | POA: Diagnosis not present

## 2023-03-22 DIAGNOSIS — N185 Chronic kidney disease, stage 5: Secondary | ICD-10-CM | POA: Diagnosis not present

## 2023-03-22 DIAGNOSIS — I82503 Chronic embolism and thrombosis of unspecified deep veins of lower extremity, bilateral: Secondary | ICD-10-CM | POA: Diagnosis not present

## 2023-03-22 DIAGNOSIS — F32A Depression, unspecified: Secondary | ICD-10-CM | POA: Diagnosis not present

## 2023-03-22 LAB — HEMOGLOBIN A1C
Hgb A1c MFr Bld: 4.7 % — ABNORMAL LOW (ref 4.8–5.6)
Mean Plasma Glucose: 88 mg/dL

## 2023-03-22 LAB — CBC
HCT: 23.2 % — ABNORMAL LOW (ref 36.0–46.0)
Hemoglobin: 7.1 g/dL — ABNORMAL LOW (ref 12.0–15.0)
MCH: 28.7 pg (ref 26.0–34.0)
MCHC: 30.6 g/dL (ref 30.0–36.0)
MCV: 93.9 fL (ref 80.0–100.0)
Platelets: 107 10*3/uL — ABNORMAL LOW (ref 150–400)
RBC: 2.47 MIL/uL — ABNORMAL LOW (ref 3.87–5.11)
RDW: 17.1 % — ABNORMAL HIGH (ref 11.5–15.5)
WBC: 4.2 10*3/uL (ref 4.0–10.5)
nRBC: 0 % (ref 0.0–0.2)

## 2023-03-22 LAB — RENAL FUNCTION PANEL
Albumin: 2.4 g/dL — ABNORMAL LOW (ref 3.5–5.0)
Anion gap: 9 (ref 5–15)
BUN: 20 mg/dL (ref 8–23)
CO2: 27 mmol/L (ref 22–32)
Calcium: 7.9 mg/dL — ABNORMAL LOW (ref 8.9–10.3)
Chloride: 94 mmol/L — ABNORMAL LOW (ref 98–111)
Creatinine, Ser: 2.63 mg/dL — ABNORMAL HIGH (ref 0.44–1.00)
GFR, Estimated: 19 mL/min — ABNORMAL LOW (ref >60)
Glucose, Bld: 129 mg/dL — ABNORMAL HIGH (ref 70–99)
Phosphorus: 3.1 mg/dL (ref 2.5–4.6)
Potassium: 3.6 mmol/L (ref 3.5–5.1)
Sodium: 130 mmol/L — ABNORMAL LOW (ref 135–145)

## 2023-03-22 MED ORDER — RENA-VITE PO TABS
1.0000 | ORAL_TABLET | Freq: Every day | ORAL | Status: DC
  Administered 2023-03-22 – 2023-03-31 (×9): 1 via ORAL
  Filled 2023-03-22 (×10): qty 1

## 2023-03-22 NOTE — TOC Initial Note (Signed)
Transition of Care (TOC) - Initial/Assessment Note   Spoke to patient at bedside.   Prior to admission active with [facility] ( [facility]) now dialysis patient.   Patient wants to continue services with [facility] . Kandee Keen with [facility] aware and will need orders for home health RN and PT . NCM entered orders and  asked provider to sign  Patient Details  Name: [name] [name] MRN: [id number] Date of Birth: 25-Sep-1956  Transition of Care [facility]) CM/SW Contact:    Kingsley Plan, RN Phone Number: 03/22/2023, 12:06 PM  Clinical Narrative:                   Expected Discharge Plan: Home w Home Health Services Barriers to Discharge: Continued Medical Work up   Patient Goals and CMS Choice Patient states their goals for this hospitalization and ongoing recovery are:: to return to home CMS Medicare.gov Compare Post Acute Care list provided to:: Patient Choice offered to / list presented to : Patient [place] ownership interest in [facility].provided to:: Patient    Expected Discharge Plan and Services   Discharge Planning Services: CM Consult Post Acute Care Choice: Home Health Living arrangements for the past 2 months: Single Family Home                 DME Arranged: N/A DME Agency: NA       HH Arranged: RN, PT HH Agency: [facility] Date [facility] Agency Contacted: 03/22/23 Time HH Agency Contacted: 1205 Representative spoke with at [facility] Agency: Kandee Keen  Prior Living Arrangements/Services Living arrangements for the past 2 months: Single Family Home Lives with:: Spouse Patient language and need for interpreter reviewed:: Yes Do you feel safe going back to the place where you live?: Yes      Need for Family Participation in Patient Care: Yes (Comment) Care giver support system in place?: Yes (comment) Current home services: DME Criminal Activity/Legal Involvement Pertinent to Current Situation/Hospitalization: No - Comment as needed  Activities of Daily  Living Home Assistive Devices/Equipment: Wheelchair ADL Screening (condition at time of admission) Patient's cognitive ability adequate to safely complete daily activities?: No Is the patient deaf or have difficulty hearing?: No Does the patient have difficulty seeing, even when wearing glasses/contacts?: No Does the patient have difficulty concentrating, remembering, or making decisions?: No Patient able to express need for assistance with ADLs?: Yes Does the patient have difficulty dressing or bathing?: Yes Independently performs ADLs?: No Communication: Independent Dressing (OT): Needs assistance Is this a change from baseline?: Pre-admission baseline Grooming: Needs assistance Is this a change from baseline?: Pre-admission baseline Feeding: Independent Bathing: Needs assistance Is this a change from baseline?: Pre-admission baseline Toileting: Needs assistance Is this a change from baseline?: Pre-admission baseline In/Out Bed: Needs assistance Is this a change from baseline?: Pre-admission baseline Walks in Home: Independent with device (comment) (electric wheelchair) Does the patient have difficulty walking or climbing stairs?: Yes Weakness of Legs: Both Weakness of Arms/Hands: None  Permission Sought/Granted   Permission granted to share information with : No     Permission granted to share info w AGENCY: [facility]        Emotional Assessment Appearance:: Appears stated age Attitude/Demeanor/Rapport: Engaged Affect (typically observed): Accepting Orientation: : Oriented to Self, Oriented to Place, Oriented to  Time, Oriented to Situation Alcohol / Substance Use: Not Applicable Psych Involvement: No (comment)  Admission diagnosis:  Osteomyelitis (HCC) [M86.9] Urinary tract infection without hematuria, site unspecified [  N39.0] Chronic thoracic back pain, unspecified back pain laterality [M54.6, G89.29] Nausea and vomiting, unspecified vomiting type  [R11.2] Acute osteomyelitis of thoracic spine (HCC) [M46.24] Patient Active Problem List   Diagnosis Date Noted   Chronic diastolic CHF (congestive heart failure) (HCC) 03/21/2023   Thrombocytopenia (HCC) 03/21/2023   ESRD on dialysis (HCC) 03/21/2023   Acute osteomyelitis of thoracic spine (HCC) 02/27/2023   Vertebral osteomyelitis (HCC) 01/28/2023   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   Osteomyelitis (HCC) 06/20/2022   Estrogen deficiency 05/25/2022   CKD (chronic kidney disease) stage 5, GFR less than 15 ml/min (HCC) 02/14/2022   Prolonged QT interval 01/07/2022   Streptococcal bacteremia    Osteomyelitis of left foot (HCC) 10/16/2021   Diabetic foot ulcers (HCC) 10/16/2021   Portal hypertension (HCC) 08/16/2021   Eczema of scalp 06/27/2021   Hypothyroidism 06/27/2021   Pruritus 06/27/2021   Seasonal allergic rhinitis due to pollen 02/02/2021   PTSD (post-traumatic stress disorder)    Osteoarthritis    MRSA (methicillin resistant Staphylococcus aureus)    Hypertension    High cholesterol    Hepatic steatosis    GERD (gastroesophageal reflux disease)    Fibromyalgia    Episode of visual loss of left eye    Depression    Constipation    Cataract    Bulging lumbar disc    Asthma    Anxiety    Anemia    Tinea capitis 10/11/2020   Tinea corporis 10/11/2020   Hip pain 05/16/2020   IDA (iron deficiency anemia) 05/04/2020   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) 03/16/2020   S/P lumbar fusion 03/04/2020   Spinal stenosis of lumbar region 01/22/2020   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) 01/21/2020   Compression fracture of L1 lumbar vertebra (HCC) 11/17/2019   Fracture of multiple ribs 11/17/2019   Nausea & vomiting 11/15/2019   Diarrhea 09/04/2019   Pressure injury of skin 04/17/2019   Septic arthritis of elbow, right (HCC) 04/17/2019   Retinopathy 04/17/2019   Renal transplant, 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   Sleep apnea  11/16/2017   Diabetic foot infection (HCC) 04/06/2017   Type 2 diabetes mellitus with hyperglycemia, with long-term current use of insulin (HCC) 04/06/2017   Neuropathy 05/05/2015   Diabetic peripheral neuropathy (HCC) 01/12/2015   CKD (chronic kidney disease), stage III (HCC) 05/27/2014   History of MI (myocardial infarction) 10/06/2013   Chest pain 10/06/2013   Nausea and vomiting 10/06/2013   Obesity (BMI 30-39.9) 09/20/2013   Multinodular goiter 04/17/2013   Normocytic anemia 02/03/2013   CAD (coronary artery disease) 01/11/2012   UTI (urinary tract infection) 08/05/2010   Hepatic cirrhosis (HCC) 07/06/2010   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   Hyperlipidemia 06/03/2008   Essential hypertension 12/23/2006   Acute MI (HCC) 2007   PCP:  Donato Schultz, DO Pharmacy:   [facility] [address] [place], [place] - [address] [place] [address] Phone: [phone] Fax: [phone]  [facility] [address] [place] [address] Phone: [phone] Fax: [phone]     Social Determinants of Health (SDOH) Social History: SDOH Screenings   Food Insecurity: No Food Insecurity (02/26/2023)  Housing: Low Risk  (02/26/2023)  Transportation Needs: No Transportation Needs (02/26/2023)  Utilities: Not At Risk (02/26/2023)  Alcohol Screen: Low Risk  (12/30/2021)  Depression (PHQ2-9): Low Risk  (08/23/2022)  Financial Resource Strain: Low Risk  (12/30/2021)  Physical Activity: Inactive (12/30/2021)  Social Connections: Socially Integrated (12/30/2021)  Stress: Stress Concern Present (12/30/2021)  Tobacco Use: Low Risk  (03/15/2023)   SDOH Interventions:     Readmission Risk Interventions    06/23/2022    2:13 PM  Readmission Risk Prevention Plan  Transportation Screening Complete  PCP or Specialist Appt within 3-5 Days Complete  HRI or Home Care Consult Complete  Social Work Consult for Recovery Care  Planning/Counseling Complete  Palliative Care Screening Not Applicable  Medication Review Oceanographer) Complete

## 2023-03-22 NOTE — Progress Notes (Signed)
Contacted [facility] SW to advise clinic that pt is not stable for d/c today in order to start at clinic tomorrow. Will provide update to clinic on pt's start date once d/c date is confirmed. Will assist as needed.   Olivia Canter Renal Navigator [phone]

## 2023-03-22 NOTE — Progress Notes (Signed)
Nutrition Follow-up  DOCUMENTATION CODES:   Obesity unspecified  INTERVENTION:  Encourage good PO intake Continue Nepro Shake po TID, each supplement provides 425 kcal and 19 grams protein Renal Multivitamin w/ minerals daily Diet education in AVS  NUTRITION DIAGNOSIS:   Increased nutrient needs related to acute illness, catabolic illness (acute OM of thoracic spine, new ESRD requiring HD) as evidenced by estimated needs. - Ongoing   GOAL:   Patient will meet greater than or equal to 90% of their needs - Progressing   MONITOR:   PO intake, Supplement acceptance, Labs, I & O's, Weight trends  REASON FOR ASSESSMENT:   Consult Diet education (ESRD)  ASSESSMENT:   67 y.o. female admits related to abdominal pain, back pain, nausea, and vomiting. PMH includes: acute MI, anemia, CAD, cirrhosis, DM, HTN, HLD, MRSA. Pt is currently receiving medical management related to acute osteomyelitis of thoracic spine.  5/3 s/p disc aspiration 5/13 new ESRD s/p [facility]  5/15 fistula placement  Pt laying bed. Reports that appetite was much better this morning and fairly well. Reports that her appetite varies still. Had some nausea last night, but did not throw up; states this was at the same time she spiked a fever.  Friend inquired about diet once going home. RD reviewed to monitor sodium, phosphorus and potassium. Discussed foods that are high. Encouraged pt to follow up with dietitian at outpatient dialysis. Additional handouts added to AVS.   Meal Intake: 75-100% x 4 meals  Medications reviewed and include: Risaquad, Calcitriol, Calcium Carbonate, Colace, Lasix, Miralax, IV antibiotics  Labs reviewed: Sodium 130, Potassium 3.6, Phosphorus 3.1   HD on 5/22 Net UF: 1000 mL  Diet Order:   Diet Order             Diet renal with fluid restriction Fluid restriction: 1200 mL Fluid; Room service appropriate? Yes; Fluid consistency: Thin  Diet effective now                   EDUCATION  NEEDS:   Education needs have been addressed  Skin:  Skin Assessment: Skin Integrity Issues: Skin Integrity Issues:: Other (Comment) Other: pressure injury to L heel  Last BM:  5/22  Height:  Ht Readings from Last 1 Encounters:  02/26/23 4\' 11"  (1.499 m)   Weight:  Wt Readings from Last 1 Encounters:  03/22/23 41.6 kg   Ideal Body Weight:  44.7 kg  BMI:  Body mass index is 18.52 kg/m.  Estimated Nutritional Needs:  Kcal:  1500-1700 Protein:  75-90g Fluid:  >/=1.5L   Kirby Crigler RD, LDN Clinical Dietitian [facility] for contact information.

## 2023-03-22 NOTE — Progress Notes (Signed)
OT Cancellation Note  Patient Details Name: [name] [name] MRN: [id number] DOB: [birth year]   Cancelled Treatment:    Reason Eval/Treat Not Completed: Patient declined, states she has fever, feels very sick, fatigued, hot, not able to participate after multiple attempts throughout day.  Alexis Goodell 03/22/2023, 2:28 PM

## 2023-03-22 NOTE — Progress Notes (Signed)
[facility] Progress Note   Assessment/ Plan:   1) New ESRD: Baseline creatinine was around 4.5 prior to admission.  AKI likely ATN.  Did require dialysis earlier in the hospitalization.  Had some improvement in kidney function as well as urine output and dialysis catheter was removed.  Now with persistent low urine output and rising creatinine most likely ESRD at this point  -[facility] placement 03/12/23, appreciate assistance - CLIP to AF MWF 1145 (arrive 1115am 1st day) - Appreciate lt BCF by Dr. Chestine Spore on 5/15 -> great thrill -Continue to monitor daily Cr, Dose meds for GFR -Monitor Daily I/Os, Daily weight  -Maintain MAP>65 for optimal renal perfusion.  -Avoid nephrotoxic medications including NSAIDs -Use synthetic opioids (Fentanyl/Dilaudid) if needed  Not making much urine.  She tolerated HD in the chair on 5/20; HD 5/22 with 1L net UF. Still has edema and will try for 2L Fri on MWF regimen.    2) Anemia: Likely multifactorial with inflammation and CKD contributing.  Increased ESA to 100 mcg weekly.  Iron replete.   3) HFpEF: Volume status overall acceptable.  Continue to monitor   4) Osteomyelitis: On daptomycin.  Per primary team -> final dose on 6/15.   5) DM2: Management per primary   6) CKD BMD: phos acceptable (not sure if I believe the 2.3). Ca corrects near normal.  5/11 PTH 405, Calcitriol 0.71mcg daily    Subjective:    Seen in room- still in pain and fevers.    Objective:   BP (!) 132/56 (BP Location: Left Arm)   Pulse 92   Temp 98.5 F (36.9 C) (Oral)   Resp 16   Ht 4\' 11"  (1.499 m)   Wt 41.6 kg   SpO2 93%   BMI 18.52 kg/m   Physical Exam: Gen:NAD CVS: RRR Resp: clear Abd: soft Ext: no  LE edema ACCESS: LIJ TDC, L BCF + T/B  Labs: BMET Recent Labs  Lab 03/16/23 0326 03/17/23 0420 03/18/23 0421 03/19/23 0427 03/20/23 0248 03/21/23 0340 03/22/23 0356  NA 131* 135 133* 131* 132* 128* 130*  K 3.9 3.9 4.0 4.1 3.7 3.6 3.6  CL 95*  98 97* 96* 96* 94* 94*  CO2 27 30 27 27 27 27 27   GLUCOSE 83 82 78 97 98 135* 129*  BUN 29* 15 26* 43* 25* 39* 20  CREATININE 3.53* 2.39* 3.39* 3.84* 2.70* 3.52* 2.63*  CALCIUM 8.5* 8.3* 8.2* 8.0* 8.3* 8.0* 7.9*  PHOS 3.5 2.6 3.8 3.7 2.3* 3.1 3.1   CBC Recent Labs  Lab 03/19/23 0427 03/20/23 0248 03/21/23 0340 03/22/23 0356  WBC 5.0 5.9 4.4 4.2  HGB 7.7* 7.7* 7.2* 7.1*  HCT 25.7* 25.5* 23.8* 23.2*  MCV 93.8 94.4 96.0 93.9  PLT 117* 114* 103* 107*      Medications:     acetaminophen  1,000 mg Oral TID   acidophilus  2 capsule Oral TID   apixaban  2.5 mg Oral BID   calcitRIOL  0.5 mcg Oral Daily   calcium carbonate  1 tablet Oral Daily   Chlorhexidine Gluconate Cloth  6 each Topical Q0600   darbepoetin (ARANESP) injection - NON-DIALYSIS  100 mcg Subcutaneous Q Fri-1800   diclofenac  1 patch Transdermal BID   docusate sodium  100 mg Oral BID   DULoxetine  60 mg Oral Daily   feeding supplement (NEPRO CARB STEADY)  237 mL Oral TID BM   furosemide  80 mg Oral BID   lidocaine  2  patch Transdermal Q24H   methocarbamol  750 mg Oral TID   mometasone-formoterol  2 puff Inhalation BID   oxyCODONE  15 mg Oral Q12H   polyethylene glycol  17 g Oral Daily   pregabalin  75 mg Oral Daily   sodium chloride flush  10-40 mL Intracatheter Q12H   sodium chloride flush  3 mL Intravenous Q12H

## 2023-03-22 NOTE — Progress Notes (Signed)
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  Last month, diagnosed with T4-T5 discitis osteomyelitis which was being treated with daptomycin.  Neurosurgery did not recommend any further interventions.     The patient returns to the hospital for worsening back pain for [redacted] week along with an episode of vomiting.  Pt found to have AKI requiring 2 cycles of HD.  Also, with ongoing osteomyelitis/discitis Thoracic spine with T4-5 disc aspiration by IR on 5/3.  Neurosurgery recommends further medical management, no surgical intervention at this time.    PT Comments    Pt received in supine and reporting increased pain and fatigue this session. Pt reports getting up with nursing and walking around room earlier, however felt weak when recently attempting to use the [facility]. Pt refusing mobility this session, but was agreeable to education. Pt educated on importance of raising the [facility] throughout the day if not sitting in the recliner to reduce dizziness with position changes and reduce risk of pneumonia. Pt verbalized understanding. Pt was assisted with raising the [facility] and was educated on how to adjust independently. Pt continues to benefit from PT services to progress toward functional mobility goals.     Recommendations for follow up therapy are one component of a multi-disciplinary discharge planning process, led by the attending physician.  Recommendations may be updated based on patient status, additional functional criteria and insurance authorization.     Assistance Recommended at Discharge Intermittent  Supervision/Assistance  Patient can return home with the following A little help with walking and/or transfers;A little help with bathing/dressing/bathroom;Assistance with cooking/housework;Help with stairs or ramp for entrance   Equipment Recommendations  None recommended by PT    Recommendations for Other Services       Precautions / Restrictions Precautions Precautions: Back Precaution Booklet Issued: No Precaution Comments: verbally reviewed precautions Required Braces or Orthoses: Spinal Brace Spinal Brace: Thoracolumbosacral orthotic Spinal Brace Comments: No orders but pt had from prior admission and wears when OOB Restrictions Weight Bearing Restrictions: No     Mobility  Bed Mobility               General bed mobility comments: Pt declining mobility due to pain and fatigue. Pt educated on how to adjust the bed position and the benefits of raising the [facility] throughout the day.        Balance       Sitting balance - Comments: not tested       Standing balance comment: not tested                            Cognition Arousal/Alertness: Awake/alert Behavior During Therapy: WFL for tasks assessed/performed, Anxious Overall Cognitive Status: Within Functional Limits for tasks assessed                                          Exercises      General Comments General comments (skin integrity, edema, etc.): Pt reporting increased fatigue and fear of falling, so pt  declined mobility this session. Pt educated on bed features and discussed possible causes of dizziness. Pt assisted with raising the [facility] and encouraged to do so throughout the day.      Pertinent Vitals/Pain Pain Assessment Pain Assessment: Faces Faces Pain Scale: Hurts even more Pain Location: Back and R side Pain Descriptors / Indicators: Grimacing, Guarding Pain Intervention(s): Monitored during session, Repositioned     PT Goals (current goals can now be found  in the care plan section) Acute Rehab PT Goals Patient Stated Goal: return home PT Goal Formulation: With patient/family Time For Goal Achievement: 03/20/23 Potential to Achieve Goals: Good Progress towards PT goals: Not progressing toward goals - comment (limited by pain and fatigue)    Frequency    Min 3X/week      PT Plan Current plan remains appropriate       AM-PAC PT "6 Clicks" Mobility   Outcome Measure  Help needed turning from your back to your side while in a flat bed without using bedrails?: A Little Help needed moving from lying on your back to sitting on the side of a flat bed without using bedrails?: A Little Help needed moving to and from a bed to a chair (including a wheelchair)?: A Little Help needed standing up from a chair using your arms (e.g., wheelchair or bedside chair)?: A Little Help needed to walk in hospital room?: A Lot Help needed climbing 3-5 steps with a railing? : Total 6 Click Score: 15    End of Session   Activity Tolerance: Patient limited by pain;Patient limited by fatigue Patient left: in bed;with call bell/phone within reach Nurse Communication: Mobility status PT Visit Diagnosis: Other abnormalities of gait and mobility (R26.89);Muscle weakness (generalized) (M62.81);Pain     Time: 9604-5409 PT Time Calculation (min) (ACUTE ONLY): 9 min  Charges:  $Therapeutic Activity: 8-22 mins                     Johny Shock, PTA Acute Rehabilitation Services Secure Chat Preferred  [phone]    Johny Shock 03/22/2023, 3:44 PM

## 2023-03-22 NOTE — Progress Notes (Signed)
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  BMI baseline.  Presented to the hospital with complaints of back pain.  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  4/29 admitted to the hospital.  MRI controllable Looking for vertebral body, new marrow edema T3.  7 mm epidural fluid collection. 5/2 nephrology was consulted for AKI on CKD 5. 5/3 underwent IR guided T5 bone biopsy and T4-T5 disc aspiration.  IR also placed temp HD catheter for HD. 5/12 vascular surgery was consulted. 5/13 left TDC placement with vascular surgery. 5/14 palliative care consulted for pain management 5/15 underwent aVF placement left brachial cephalic. Had a Fall in the OR while being transferred from the OR table. 5/17 Accepted at [facility] TTS pending pt's ability to sit for extended period of time. 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 -Imaging of MRI T-spine done 02/27/2023 with persistent T4-T5 discitis/osteomyelitis, in comparison to January 28, 2023, mild interval collapse in the T4 vertebral body.  Mild new marrow edema at T3 is new and likely represents osteomyelitis.  Mild interval increase in size of suspected dorsal epidural fluid collection measuring 7 mm, possibly abscess or phlegmon. -Blood cultures obtained 02/26/2023 were negative. -IR consulted patient underwent biopsy with no growth to date from aspiration. -Patient seen in consultation by ID who recommended IV daptomycin through 04/13/2023 with outpatient follow-up scheduled 04/12/2023.  2.  Pain control/chronic anxiety -Patient assessed by palliative care, prior to admission patient noted to be on Norco 5/325 4 times daily as needed also noted to have been on Valium before in the past.  Patient also noted on Lyrica 75 mg 3 times daily. -Patient seen by palliative care who are managing patient's pain. -Patient currently on OxyContin 15 mg p.o. twice daily, oxycodone 5 to 10 mg p.o. every 4 hours as needed breakthrough pain, Cymbalta 60 mg daily, Lyrica 75  mg daily, scheduled Tylenol 1000 mg 3 times daily, diclofenac 1.3% patch twice daily, lidocaine patch 5% 2 patches daily, Robaxin-750 milligrams 3 times daily.  K-pad as tolerated. -Fentanyl discontinued by palliative care. -Patient will need to follow-up with PCP in the outpatient setting for further management  of her pain and if need be may need a referral to the pain clinic from her PCP.   -Palliative care following and helping manage patient's pain in-house.  -Per palliative care.  3.  ATN/AKI on CKD 5>>>> ESRD -Patient noted to have a baseline creatinine around 4, serum creatinine peaked at 8 leading to initiation of HD 5/3. -TDC placed 03/12/2023 -AVF placement 03/14/2023 -Patient will need to be able to tolerate HD in recliner for outpatient HD, noted to have been able to tolerate HD on 5/20 but had significant pain after that. -Plan nephrology patient still with edema and try for 2 L on Friday on MWF regimen. -It is noted that patient was informed if hemodialysis went well on Wednesday and Friday she might be close to being discharged from the hospital. -Nephrology following and appreciate input and recommendations.  4.  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 -Hemoglobin A1c 4.6 (02/14/2022) -Repeat hemoglobin A1c at 4.7 (03/21/2023) -CBG 116 this morning -Follow.  5.  Diarrhea/C. difficile antigen positive but toxigenic PCR negative/constipation -Patient noted to have had diarrhea workup which showed that patient was to was positive for C. difficile antigen.  Toxigenic PCR negative. -Patient noted to have received questran which resulted in constipation. -Currently on bowel regimen of Colace twice daily,  6.  Urinary retention -Patient noted to have urinary retention early on in the hospitalization had Foley catheter placed 5/2 which was removed on 5/9 and required placement again on 5/10, no prior history of urinary retention. -Patient also noted now ESRD on HD. -Foley catheter removed 03/19/2023. -Urine output not properly recorded however patient undergoing 24-hour urine studies. -Bladder scan done with 2 ml this afternoon.  -Monitor urine output.  7.  Left lower extremity  wound -Continue current wound care dressings with Aquacel, wound care RN following. Pressure Injury 02/26/23 Heel Left (Active)  02/26/23 1826  Location: Heel  Location Orientation: Left  Staging:   Wound Description (Comments):   Present on Admission: Yes   8 chronic thrombocytopenia -Found secondary to cirrhosis. -Stable. -No overt bleeding.  9.  Normocytic anemia -Iron studies consistent with anemia of chronic disease. -Hemoglobin noted to have dropped down to 6.8 on 5/18, status post transfusion 1 unit PRBCs. -Hemoglobin currently stable at 7.1. -Status post IV iron in EPO support. -Transfusion threshold hemoglobin < 7. -If transfusion is needed ideally should be done during hemodialysis. -Nephrology following.  10.  Chronic diastolic CHF -Volume management per hemodialysis. -Continue Lasix 80 mg twice daily.   -On HD.    11.  Hypertension -BP soft.  Patient on Lasix.   12.  Depression/anxiety -Cymbalta, diazepam.   13.  Chronic lower extremity DVTs -Continue Eliquis.   14.  Fall in the OR -Patient noted to have had a fall in the OR while being transferred from the OR table to the stretcher. -CT chest abdomen and pelvis with no acute trauma noted. -CT head negative. -Plain films of the left shoulder negative for any fracture. -Currently in sling for pain control.    DVT prophylaxis: Eliquis Code Status: Full Family Communication: Updated patient.  No family at  bedside. Disposition: Awaiting ability to sit in recliner for hemodialysis/home with home health therapies once cleared by nephrology.  Status is: Inpatient Remains inpatient appropriate because: Severity of illness   Consultants:  ID: Dr. [name] 02/10/2023 Wound care RN IR: Dr.De Melchor Amour 02/28/2023 Nephrology: Dr. Glenna Fellows 03/01/2023 Vascular surgery: Dr. Randie Heinz 03/11/2023 Palliative care: Dr. Linna Darner 03/13/2023  Procedures:  CT head 03/14/2023 Chest x-ray 03/12/2023, Placement of tunneled  left-sided hemodialysis catheter: Per IR: Dr. Fredia Sorrow 03/12/2023 Plain films of left shoulder 03/14/2023 MRI T-spine 03/29/2023 Placement of left jugular nontunneled dialysis catheter using ultrasound and fluoroscopic guidance: Per IR, Dr. Lowella Dandy 03/02/2023 Renal ultrasound 03/01/2023 Upper extremity vein mapping 03/11/2023 Fluoroscopy guided T5 core biopsy and T4-5 disc aspiration: Per Dr.Katyucia, IR, 03/02/2023 Placement of left jugular type lumen dialysis catheter: Per IR, Dr. Lowella Dandy 03/02/2023 Left brachiocephalic AV fistula placement: Per vascular surgery: Dr. Chestine Spore 03/14/2023 Ultrasound-guided access left internal jugular vein, placement of left internal jugular vein tunneled dialysis catheter: Per Dr. Chestine Spore 03/12/2023 1 unit PRBC transfusion 03/17/2023  Antimicrobials: Anti-infectives (From admission, onward)    Start     Dose/Rate Route Frequency Ordered Stop   03/14/23 0000  vancomycin (VANCOCIN) IVPB 1000 mg/200 mL premix        1,000 mg 200 mL/hr over 60 Minutes Intravenous To Surgery 03/13/23 0731 03/14/23 1452   03/07/23 0000  daptomycin (CUBICIN) IVPB        650 mg Intravenous Every 48 hours 03/06/23 1302 04/13/23 2359   02/27/23 1630  DAPTOmycin (CUBICIN) 650 mg in sodium chloride 0.9 % IVPB        8 mg/kg  82.1 kg 126 mL/hr over 30 Minutes Intravenous Every 48 hours 02/26/23 1615     02/26/23 1145  cefTRIAXone (ROCEPHIN) 2 g in sodium chloride 0.9 % 100 mL IVPB        2 g 200 mL/hr over 30 Minutes Intravenous  Once 02/26/23 1143 02/26/23 1510         Subjective: States had a bad night last night with fevers as high as 102 and continued back pain.  Currently afebrile.  No chest pain.  No shortness of breath.  Objective: Vitals:   03/21/23 1839 03/21/23 1926 03/22/23 0409 03/22/23 0829  BP:  (!) 130/53  (!) 132/56  Pulse:  (!) 102  92  Resp:  19  16  Temp:  99.3 F (37.4 C)  98.5 F (36.9 C)  TempSrc:  Oral  Oral  SpO2: 100% 99%  93%  Weight:   41.6 kg   Height:         Intake/Output Summary (Last 24 hours) at 03/22/2023 1303 Last data filed at 03/22/2023 1100 Gross per 24 hour  Intake 183 ml  Output 1000 ml  Net -817 ml    Filed Weights   03/20/23 0454 03/21/23 0420 03/22/23 0409  Weight: 78.1 kg 41.9 kg 41.6 kg    Examination:  General exam: NAD. Respiratory system: CTAB anterior lung fields.  No wheezes, no crackles, no rhonchi.  Fair air movement.  Speaking in full sentences.  Cardiovascular system: Regular rate rhythm.  No murmurs rubs or gallops.  No JVD.  No lower extremity edema. Gastrointestinal system: Abdomen is soft, nontender, nondistended, positive bowel sounds.  No rebound.  No guarding. Central nervous system: Alert and oriented. No focal neurological deficits. Extremities: Symmetric 5 x 5 power. Skin: No rashes, lesions or ulcers Psychiatry: Judgement and insight appear normal. Mood & affect appropriate.     Data Reviewed: I have  personally reviewed following labs and imaging studies  CBC: Recent Labs  Lab 03/18/23 0421 03/19/23 0427 03/20/23 0248 03/21/23 0340 03/22/23 0356  WBC 4.3 5.0 5.9 4.4 4.2  HGB 8.2* 7.7* 7.7* 7.2* 7.1*  HCT 26.7* 25.7* 25.5* 23.8* 23.2*  MCV 95.0 93.8 94.4 96.0 93.9  PLT 115* 117* 114* 103* 107*     Basic Metabolic Panel: Recent Labs  Lab 03/18/23 0421 03/19/23 0427 03/20/23 0248 03/21/23 0340 03/22/23 0356  NA 133* 131* 132* 128* 130*  K 4.0 4.1 3.7 3.6 3.6  CL 97* 96* 96* 94* 94*  CO2 27 27 27 27 27   GLUCOSE 78 97 98 135* 129*  BUN 26* 43* 25* 39* 20  CREATININE 3.39* 3.84* 2.70* 3.52* 2.63*  CALCIUM 8.2* 8.0* 8.3* 8.0* 7.9*  PHOS 3.8 3.7 2.3* 3.1 3.1     GFR: Estimated Creatinine Clearance (by C-G formula based on SCr of 2.63 mg/dL (H)) Female: 47.8 mL/min (A) Female: 16 mL/min (A)  Liver Function Tests: Recent Labs  Lab 03/18/23 0421 03/19/23 0427 03/20/23 0248 03/21/23 0340 03/22/23 0356  AST  --  14*  --   --   --   ALT  --  9  --   --   --   ALKPHOS   --  118  --   --   --   BILITOT  --  1.2  --   --   --   PROT  --  6.5  --   --   --   ALBUMIN 2.5* 2.5*  2.5* 2.6* 2.5* 2.4*     CBG: Recent Labs  Lab 03/20/23 1201 03/20/23 1830 03/20/23 2015 03/20/23 2339 03/21/23 0409  GLUCAP 74 142* 130* 138* 116*      No results found for this or any previous visit (from the past 240 hour(s)).       Radiology Studies: No results found.      Scheduled Meds:  acetaminophen  1,000 mg Oral TID   acidophilus  2 capsule Oral TID   apixaban  2.5 mg Oral BID   calcitRIOL  0.5 mcg Oral Daily   calcium carbonate  1 tablet Oral Daily   Chlorhexidine Gluconate Cloth  6 each Topical Q0600   darbepoetin (ARANESP) injection - NON-DIALYSIS  100 mcg Subcutaneous Q Fri-1800   diclofenac  1 patch Transdermal BID   docusate sodium  100 mg Oral BID   DULoxetine  60 mg Oral Daily   feeding supplement (NEPRO CARB STEADY)  237 mL Oral TID BM   furosemide  80 mg Oral BID   lidocaine  2 patch Transdermal Q24H   methocarbamol  750 mg Oral TID   mometasone-formoterol  2 puff Inhalation BID   multivitamin  1 tablet Oral QHS   oxyCODONE  15 mg Oral Q12H   polyethylene glycol  17 g Oral Daily   pregabalin  75 mg Oral Daily   sodium chloride flush  10-40 mL Intracatheter Q12H   sodium chloride flush  3 mL Intravenous Q12H   Continuous Infusions:  DAPTOmycin (CUBICIN) 650 mg in sodium chloride 0.9 % IVPB Stopped (03/21/23 1855)     LOS: 23 days    Time spent: 35 minutes    Ramiro Harvest, MD [facility]   To contact the attending provider between 7A-7P or the covering provider during after hours 7P-7A, please log into the web site [email or web address] and access using universal [place] password for that web site. If you do not have  the password, please call the hospital operator.  03/22/2023, 1:03 PM

## 2023-03-22 NOTE — Progress Notes (Signed)
Daily Progress Note   Patient Name: [name] [name]       Date: 03/22/2023 DOB: 20-May-1956  Age: 67 y.o. MRN#: [id number] Attending Physician: Rodolph Bong, MD Primary Care Physician: Zola Button, Grayling Congress, DO Admit Date: 02/26/2023  Reason for Consultation/Follow-up: Establishing goals of care and Pain control  Subjective: Patient is in bed wrapped in blankets. States she had a rough night with fever and pain.  Length of Stay: 23  Current Medications: Scheduled Meds:  . acetaminophen  1,000 mg Oral TID  . acidophilus  2 capsule Oral TID  . apixaban  2.5 mg Oral BID  . calcitRIOL  0.5 mcg Oral Daily  . calcium carbonate  1 tablet Oral Daily  . Chlorhexidine Gluconate Cloth  6 each Topical Q0600  . darbepoetin (ARANESP) injection - NON-DIALYSIS  100 mcg Subcutaneous Q Fri-1800  . diclofenac  1 patch Transdermal BID  . docusate sodium  100 mg Oral BID  . DULoxetine  60 mg Oral Daily  . feeding supplement (NEPRO CARB STEADY)  237 mL Oral TID BM  . furosemide  80 mg Oral BID  . lidocaine  2 patch Transdermal Q24H  . methocarbamol  750 mg Oral TID  . mometasone-formoterol  2 puff Inhalation BID  . oxyCODONE  15 mg Oral Q12H  . polyethylene glycol  17 g Oral Daily  . pregabalin  75 mg Oral Daily  . sodium chloride flush  10-40 mL Intracatheter Q12H  . sodium chloride flush  3 mL Intravenous Q12H    Continuous Infusions: . DAPTOmycin (CUBICIN) 650 mg in sodium chloride 0.9 % IVPB Stopped (03/21/23 1855)    PRN Meds: albuterol, alteplase, diazepam, heparin, hydrALAZINE, lidocaine (PF), lidocaine-prilocaine, naLOXone (NARCAN)  injection, ondansetron (ZOFRAN) IV, mouth rinse, oxyCODONE, oxyCODONE, pentafluoroprop-tetrafluoroeth  Physical Exam Constitutional:       Appearance: She is ill-appearing.  Neck:     Comments: Stiffness  Pulmonary:     Effort: Pulmonary effort is normal.  Skin:    General: Skin is warm and dry.  Neurological:     Mental Status: She is alert.  Psychiatric:        Mood and Affect: Mood normal.        Behavior: Behavior normal.             Vital Signs:  BP (!) 132/56 (BP Location: Left Arm)   Pulse 92   Temp 98.5 F (36.9 C) (Oral)   Resp 16   Ht 4\' 11"  (1.499 m)   Wt 41.6 kg   SpO2 93%   BMI 18.52 kg/m  SpO2: SpO2: 93 % O2 Device: O2 Device: Nasal Cannula O2 Flow Rate: O2 Flow Rate (L/min): 2 L/min  Intake/output summary:  Intake/Output Summary (Last 24 hours) at 03/22/2023 0915 Last data filed at 03/22/2023 0239 Gross per 24 hour  Intake 63 ml  Output 1000 ml  Net -937 ml   LBM: Last BM Date : 03/21/23 Baseline Weight: Weight: 82.1 kg Most recent weight: Weight: 41.6 kg       Palliative Assessment/Data: 40%      Patient Active Problem List   Diagnosis Date Noted  . Chronic diastolic CHF (congestive heart failure) (HCC) 03/21/2023  . Thrombocytopenia (HCC) 03/21/2023  . ESRD on dialysis (HCC) 03/21/2023  . Acute osteomyelitis of thoracic spine (HCC) 02/27/2023  . Vertebral osteomyelitis (HCC) 01/28/2023  . 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 Osteomyelitis (HCC) 06/20/2022  . Estrogen deficiency 05/25/2022  . CKD (chronic kidney disease) stage 5, GFR less than 15 ml/min (HCC) 02/14/2022  . Prolonged QT interval 01/07/2022  . Streptococcal bacteremia   . Osteomyelitis of left foot (HCC) 10/16/2021  . Diabetic foot ulcers (HCC) 10/16/2021  . Portal hypertension (HCC) 08/16/2021  . Eczema of scalp 06/27/2021  . Hypothyroidism 06/27/2021  . Pruritus 06/27/2021  . Seasonal allergic rhinitis due to pollen 02/02/2021  . PTSD (post-traumatic stress disorder)   . Osteoarthritis   . MRSA (methicillin resistant Staphylococcus aureus)   . Hypertension   . High cholesterol   . Hepatic steatosis    . GERD (gastroesophageal reflux disease)   . Fibromyalgia   . Episode of visual loss of left eye   . Depression   . Constipation   . Cataract   . Bulging lumbar disc   . Asthma   . Anxiety   . Anemia   . Tinea capitis 10/11/2020  . Tinea corporis 10/11/2020  . Hip pain 05/16/2020  . IDA (iron deficiency anemia) 05/04/2020  . 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 S/P lumbar fusion 03/04/2020  . Spinal stenosis of lumbar region 01/22/2020  . 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 Compression fracture of L1 lumbar vertebra (HCC) 11/17/2019  . Fracture of multiple ribs 11/17/2019  . Nausea & vomiting 11/15/2019  . Diarrhea 09/04/2019  . Pressure injury of skin 04/17/2019  . Septic arthritis of elbow, right (HCC) 04/17/2019  . 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 Sleep apnea 11/16/2017  . Diabetic foot infection (HCC) 04/06/2017  . Type 2 diabetes mellitus with hyperglycemia, with long-term current use of insulin (HCC) 04/06/2017  . Neuropathy 05/05/2015  . Diabetic peripheral neuropathy (HCC) 01/12/2015  . CKD (chronic kidney disease), stage III (HCC) 05/27/2014  . History of MI (myocardial infarction) 10/06/2013  . Chest pain 10/06/2013  . Nausea and vomiting 10/06/2013  . Obesity (BMI 30-39.9) 09/20/2013  . 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 Normocytic anemia 02/03/2013  . CAD (coronary artery disease) 01/11/2012  . UTI (urinary tract infection) 08/05/2010  . Hepatic cirrhosis (HCC) 07/06/2010  . 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  . Hyperlipidemia 06/03/2008  . Essential hypertension 12/23/2006  . Acute MI [facility]) 2007    Palliative Care Assessment & Plan   Patient Profile: [name] [name] is  a 67 y.o. adult with medical history significant of CKD 5,diabetes, neuropathy, thrombocytopenia, OSA, osteomyelitis, diabetic foot infections, anemia, hypothyroidism,  hypertension, hyperlipidemia, CAD, cirrhosis, asthma, spinal stenosis, status post lumbar fusion, status post renal transplant (age 58), PTSD, depression, anxiety, obesity, fibromyalgia, DVT, cataracts presenting with worsening back pain, nausea, vomiting. She presented to the hospital for back pain, abdominal pain, nausea, and vomiting and was admitted on 02/26/23.   Assessment: Patient had a fever overnight. She was very uncomfortable in general but also had severe back/neck pain. I encouraged her to advocate for herself and request PRN pain medication. We discussed her plan for pain management upon discharge. Currently she is managed by her PCP but she is considering moving to a Pain Management specialist.  While I was in the room she asked for pain medication and it was brought promptly by the RN.  Recommendations/Plan: Full code / Full scope of care   Symptoms:   Chronic Pain Continue tylenol 1000 mg tid Continue diclofenac (Flector) 1.3% patch bid Continue lidocaine patch 5% 2 patches daily Continue Robaxin 750mg  tid Oxycontin 15mg  PO BID Oxycodone 5-10mg  PO Q4H PRN moderate/severe pain (administration note to use PRN PO pain medication as first line) Discontinue Fentanyl 25mg  IVP Q3H PRN breakthrough pain Continue duloxetine (Cymbalta) 60mg  daily Continue pregabalin (Lyrica) 75mg  daily Kpad to back as tolerated  Nausea Continue zofran 4mg  IV q6hr prn   Anxiety Continue duloxetine (Cymbalta) 60mg  daily Continue pregabalin (Lyrica) 75mg  daily Continue diazepam (Valium) 5 mg q12hr prn   Constipation: Senna 2 Tabs PO BID x2 days (Started 5/18) - Using great caution in the setting of C.Diff status Miralax BID  Goals of Care and Additional Recommendations: Limitations on Scope of Treatment: Full Scope Treatment  Code Status:    Code Status Orders  (From admission, onward)           Start     Ordered   02/26/23 1448  Full code  Continuous       Question:  By:   Answer:  Consent: discussion documented in EHR   02/26/23 1449           Code Status History     Date Active Date Inactive Code Status Order ID Comments User Context   01/28/2023 2336 02/04/2023 1730 Full Code [id number]  John Giovanni, MD ED   06/21/2022 0025 07/11/2022 2159 Full Code [id number]  Carollee Herter, DO ED   01/07/2022 0511 01/09/2022 2045 Full Code [id number]  Howerter, Chaney Born, DO Inpatient   10/17/2021 0420 10/22/2021 1928 Full Code [id number]  Chotiner, Claudean Severance, MD ED   02/18/2020 2241 02/26/2020 2334 Full Code [id number]  Lewie Chamber, MD Inpatient   01/29/2020 1704 02/18/2020 2044 Full Code [id number]  Charlton Amor, PA-C Inpatient   01/29/2020 1704 01/29/2020 1704 Full Code [id number]  Charlton Amor, PA-C Inpatient   01/22/2020 2141 01/29/2020 1650 Full Code [id number]  Bedelia Person, MD Inpatient   01/21/2020 0012 01/22/2020 2141 Full Code [id number]  Eduard Clos, MD Inpatient   11/15/2019 2200 11/18/2019 2226 Full Code [id number]  Pearson Grippe, MD ED   04/15/2019 2122 04/21/2019 2320 Full Code [id number]  Rometta Emery, MD Inpatient   04/06/2017 2004 04/11/2017 2109 Full Code [id number]  Alberteen Sam, MD Inpatient   09/29/2016 1831 10/02/2016 1643 Full Code [id number]  Nadara Mustard, MD Inpatient   03/19/2013 1507 03/21/2013 1812 Full Code [id number]  Nadara Mustard, MD Inpatient   02/10/2013 2124 02/20/2013 1859 Full Code  [id number]  Nadara Mustard, MD Inpatient   02/03/2013 2137 02/10/2013 2124 Full Code [id number]  Cristal Ford, MD Inpatient   01/11/2012 0612 01/13/2012 1645 Full Code [id number]  Wilder Glade, RN Inpatient       Prognosis:  Unable to determine  Discharge Planning: To Be Determined  Care plan was discussed with Dr. Janee Morn  Thank you for allowing the Palliative Medicine Team to assist in the care of this patient.  Time spent: 35 min   Detailed review of medical records ( labs, imaging, vital signs), medically appropriate exam,  counseling and education to patient, documenting clinical information, medication management, coordination of care.   Sherryll Burger, NP  Please contact Palliative Medicine Team phone at [phone] for questions and concerns.

## 2023-03-23 ENCOUNTER — Inpatient Hospital Stay (HOSPITAL_COMMUNITY): Payer: PRIVATE HEALTH INSURANCE

## 2023-03-23 DIAGNOSIS — R079 Chest pain, unspecified: Secondary | ICD-10-CM

## 2023-03-23 DIAGNOSIS — R509 Fever, unspecified: Secondary | ICD-10-CM

## 2023-03-23 LAB — PREPARE RBC (CROSSMATCH)

## 2023-03-23 LAB — RENAL FUNCTION PANEL
Albumin: 2.3 g/dL — ABNORMAL LOW (ref 3.5–5.0)
Albumin: 3 g/dL — ABNORMAL LOW (ref 3.5–5.0)
Anion gap: 10 (ref 5–15)
Anion gap: 14 (ref 5–15)
BUN: 40 mg/dL — ABNORMAL HIGH (ref 8–23)
BUN: 12 mg/dL (ref 8–23)
CO2: 26 mmol/L (ref 22–32)
CO2: 23 mmol/L (ref 22–32)
Calcium: 7.8 mg/dL — ABNORMAL LOW (ref 8.9–10.3)
Calcium: 8.1 mg/dL — ABNORMAL LOW (ref 8.9–10.3)
Chloride: 91 mmol/L — ABNORMAL LOW (ref 98–111)
Chloride: 92 mmol/L — ABNORMAL LOW (ref 98–111)
Creatinine, Ser: 3.55 mg/dL — ABNORMAL HIGH (ref 0.44–1.00)
Creatinine, Ser: 1.69 mg/dL — ABNORMAL HIGH (ref 0.44–1.00)
GFR, Estimated: 14 mL/min — ABNORMAL LOW (ref >60)
GFR, Estimated: 33 mL/min — ABNORMAL LOW (ref >60)
Glucose, Bld: 103 mg/dL — ABNORMAL HIGH (ref 70–99)
Glucose, Bld: 131 mg/dL — ABNORMAL HIGH (ref 70–99)
Phosphorus: 3.3 mg/dL (ref 2.5–4.6)
Phosphorus: 1 mg/dL — CL (ref 2.5–4.6)
Potassium: 3.4 mmol/L — ABNORMAL LOW (ref 3.5–5.1)
Potassium: 3.3 mmol/L — ABNORMAL LOW (ref 3.5–5.1)
Sodium: 127 mmol/L — ABNORMAL LOW (ref 135–145)
Sodium: 129 mmol/L — ABNORMAL LOW (ref 135–145)

## 2023-03-23 LAB — URINALYSIS, ROUTINE W REFLEX MICROSCOPIC
Glucose, UA: NEGATIVE mg/dL
Ketones, ur: NEGATIVE mg/dL
Nitrite: NEGATIVE
Protein, ur: 100 mg/dL — AB
Specific Gravity, Urine: 1.016 (ref 1.005–1.030)
pH: 5 (ref 5.0–8.0)

## 2023-03-23 LAB — CREATININE, URINE, 24 HOUR
Collection Interval-UCRE24: 24 hours
Creatinine, 24H Ur: 94 mg/d — ABNORMAL LOW (ref 600–1800)
Creatinine, Urine: 94 mg/dL
Urine Total Volume-UCRE24: 100 mL

## 2023-03-23 LAB — GLUCOSE, CAPILLARY
Glucose-Capillary: 103 mg/dL — ABNORMAL HIGH (ref 70–99)
Glucose-Capillary: 133 mg/dL — ABNORMAL HIGH (ref 70–99)

## 2023-03-23 LAB — TROPONIN I (HIGH SENSITIVITY)
Troponin I (High Sensitivity): 35 ng/L — ABNORMAL HIGH (ref <18)
Troponin I (High Sensitivity): 45 ng/L — ABNORMAL HIGH (ref <18)

## 2023-03-23 LAB — TYPE AND SCREEN: Unit division: 0

## 2023-03-23 LAB — CBC WITH DIFFERENTIAL/PLATELET
Abs Immature Granulocytes: 0 10*3/uL (ref 0.00–0.07)
Band Neutrophils: 17 %
Basophils Absolute: 0.1 10*3/uL (ref 0.0–0.1)
Basophils Relative: 2 %
Eosinophils Absolute: 0.1 10*3/uL (ref 0.0–0.5)
Eosinophils Relative: 3 %
HCT: 26.2 % — ABNORMAL LOW (ref 36.0–46.0)
Hemoglobin: 8.5 g/dL — ABNORMAL LOW (ref 12.0–15.0)
Lymphocytes Relative: 4 %
Lymphs Abs: 0.1 10*3/uL — ABNORMAL LOW (ref 0.7–4.0)
MCH: 28.8 pg (ref 26.0–34.0)
MCHC: 32.4 g/dL (ref 30.0–36.0)
MCV: 88.8 fL (ref 80.0–100.0)
Monocytes Absolute: 0 10*3/uL — ABNORMAL LOW (ref 0.1–1.0)
Monocytes Relative: 1 %
Neutro Abs: 3.2 10*3/uL (ref 1.7–7.7)
Neutrophils Relative %: 73 %
Platelets: 91 10*3/uL — ABNORMAL LOW (ref 150–400)
RBC: 2.95 MIL/uL — ABNORMAL LOW (ref 3.87–5.11)
RDW: 16.5 % — ABNORMAL HIGH (ref 11.5–15.5)
WBC: 3.5 10*3/uL — ABNORMAL LOW (ref 4.0–10.5)
nRBC: 0 % (ref 0.0–0.2)

## 2023-03-23 LAB — BPAM RBC: Unit Type and Rh: 600

## 2023-03-23 LAB — CBC
HCT: 20.2 % — ABNORMAL LOW (ref 36.0–46.0)
Hemoglobin: 6.1 g/dL — CL (ref 12.0–15.0)
MCH: 28.2 pg (ref 26.0–34.0)
MCHC: 30.2 g/dL (ref 30.0–36.0)
MCV: 93.5 fL (ref 80.0–100.0)
Platelets: 96 10*3/uL — ABNORMAL LOW (ref 150–400)
RBC: 2.16 MIL/uL — ABNORMAL LOW (ref 3.87–5.11)
RDW: 17.5 % — ABNORMAL HIGH (ref 11.5–15.5)
WBC: 2.3 10*3/uL — ABNORMAL LOW (ref 4.0–10.5)
nRBC: 0 % (ref 0.0–0.2)

## 2023-03-23 MED ORDER — SODIUM CHLORIDE 0.9% IV SOLUTION: Freq: Once | INTRAVENOUS | Status: DC

## 2023-03-23 MED ORDER — ACETAMINOPHEN 325 MG PO TABS: 650.0000 mg | ORAL_TABLET | Freq: Once | ORAL | Status: DC

## 2023-03-23 MED ORDER — ALBUMIN HUMAN 25 % IV SOLN
INTRAVENOUS | Status: AC
  Administered 2023-03-23: 25 g via INTRAVENOUS
  Filled 2023-03-23: qty 100

## 2023-03-23 MED ORDER — POTASSIUM PHOSPHATES 15 MMOLE/5ML IV SOLN
30.0000 mmol | Freq: Once | INTRAVENOUS | Status: AC
  Administered 2023-03-23: 30 mmol via INTRAVENOUS
  Filled 2023-03-23 (×2): qty 10

## 2023-03-23 MED ORDER — MIDODRINE HCL 5 MG PO TABS: 10.0000 mg | ORAL_TABLET | Freq: Once | ORAL | Status: AC

## 2023-03-23 MED ORDER — HYDROMORPHONE HCL 1 MG/ML IJ SOLN
0.5000 mg | Freq: Once | INTRAMUSCULAR | Status: AC
  Administered 2023-03-23: 0.5 mg via INTRAVENOUS
  Filled 2023-03-23: qty 0.5

## 2023-03-23 MED ORDER — NITROGLYCERIN 0.4 MG SL SUBL: 0.4000 mg | SUBLINGUAL_TABLET | SUBLINGUAL | Status: DC | PRN

## 2023-03-23 MED ORDER — MIDODRINE HCL 5 MG PO TABS
ORAL_TABLET | ORAL | Status: AC
  Administered 2023-03-23: 10 mg via ORAL
  Filled 2023-03-23: qty 2

## 2023-03-23 MED ORDER — ALBUMIN HUMAN 25 % IV SOLN: 25.0000 g | Freq: Once | INTRAVENOUS | Status: AC

## 2023-03-23 NOTE — Progress Notes (Signed)
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  BMI baseline.  Presented to the hospital with complaints of back pain.  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  4/29 admitted to the hospital.  MRI controllable Looking for vertebral body, new marrow edema T3.  7 mm epidural fluid collection. 5/2 nephrology was consulted for AKI on CKD 5. 5/3 underwent IR guided T5 bone biopsy and T4-T5 disc aspiration.  IR also placed temp HD catheter for HD. 5/12 vascular surgery was consulted. 5/13 left TDC placement with vascular surgery. 5/14 palliative care consulted for pain management 5/15 underwent aVF placement left brachial cephalic. Had a Fall in the OR while being transferred from the OR table. 5/17 Accepted at [facility] SW [facility] TTS pending pt's ability to sit for extended period of time. 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 -Imaging of MRI T-spine done 02/27/2023 with persistent T4-T5 discitis/osteomyelitis, in comparison to January 28, 2023, mild interval collapse in the T4 vertebral body.  Mild new marrow edema at T3 is new and likely represents osteomyelitis.  Mild interval increase in size of suspected dorsal epidural fluid collection measuring 7 mm, possibly abscess or phlegmon. -Blood cultures obtained 02/26/2023 were negative. -IR consulted patient underwent biopsy with no growth to date from aspiration. -Patient seen in consultation by ID who recommended IV daptomycin through 04/13/2023 with outpatient follow-up scheduled 04/12/2023.  2.  Pain control/chronic anxiety -Patient assessed by palliative care, prior to admission patient noted to be on Norco 5/325 4 times daily as needed also noted to have been on Valium before in the past.  Patient also noted on Lyrica 75 mg 3 times daily. -Patient seen by palliative care who are managing patient's pain. -Patient currently on OxyContin 15 mg p.o. twice daily, oxycodone 5 to 10 mg p.o. every 4 hours as needed breakthrough pain, Cymbalta 60 mg daily, Lyrica 75  mg daily, scheduled Tylenol 1000 mg 3 times daily, diclofenac 1.3% patch twice daily, lidocaine patch 5% 2 patches daily, Robaxin-750 milligrams 3 times daily.  K-pad as tolerated. -Fentanyl discontinued by palliative care. -Patient will need to follow-up with PCP in the outpatient setting for further management  of her pain and if need be may need a referral to the pain clinic from her PCP.   -Palliative care following and helping manage patient's pain in-house.  -Per palliative care.  3.  ATN/AKI on CKD 5>>>> ESRD -Patient noted to have a baseline creatinine around 4, serum creatinine peaked at 8 leading to initiation of HD 5/3. -TDC placed 03/12/2023 -AVF placement 03/14/2023 -Patient will need to be able to tolerate HD in recliner for outpatient HD, noted to have been able to tolerate HD on 5/20 but had significant pain after that. -Plan nephrology patient still with edema and try for 2 L on today, Friday on MWF regimen. -It is noted that patient was informed if hemodialysis went well on Wednesday and Friday she might be close to being discharged from the hospital. -Nephrology following and appreciate input and recommendations.  4.  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 -Hemoglobin A1c 4.6 (02/14/2022) -Repeat hemoglobin A1c at 4.7 (03/21/2023) -CBG 103 this morning -Follow.  5.  Diarrhea/C. difficile antigen positive but toxigenic PCR negative/constipation -Patient noted to have had diarrhea workup which showed that patient was to was positive for C. difficile antigen.  Toxigenic PCR negative. -Patient noted to have received questran which resulted in constipation. -Currently on bowel regimen of Colace twice daily,  6.  Urinary retention -Patient noted to have urinary retention early on in the hospitalization had Foley catheter placed 5/2 which was removed on 5/9 and required placement again on 5/10, no prior history of urinary retention. -Patient also noted now ESRD on HD. -Foley catheter removed 03/19/2023. -Urine output not properly recorded however patient undergoing 24-hour urine studies. -Bladder scan done with 82 ml earlier this morning around midnight.  -Monitor urine output.  7.   Left lower extremity wound -Continue current wound care dressings with Aquacel, wound care RN following. Pressure Injury 02/26/23 Heel Left (Active)  02/26/23 1826  Location: Heel  Location Orientation: Left  Staging:   Wound Description (Comments):   Present on Admission: Yes   8 chronic thrombocytopenia -Found secondary to cirrhosis. -Stable. -No overt bleeding.  9.  Normocytic anemia -Iron studies consistent with anemia of chronic disease. -Hemoglobin noted to have dropped down to 6.8 on 5/18, status post transfusion 1 unit PRBCs. -Hemoglobin currently stable at 6.1 this morning. -Status post IV iron in EPO support. -Transfused 2 units PRBCs during HD today, orders placed. -Transfusion threshold hemoglobin < 7. -Nephrology following.  10.  Chronic diastolic CHF -Volume management per hemodialysis. -Continue Lasix 80 mg twice daily.   -On HD.    11.  Hypertension -BP soft.  Patient on Lasix.   12.  Depression/anxiety -Continue Cymbalta, diazepam.   13.  Chronic lower extremity DVTs -Eliquis.    14.  Fall in the OR -Patient noted to have had a fall in the OR while being transferred from the OR table to the stretcher. -CT chest abdomen and pelvis with no acute trauma noted. -CT head negative. -Plain films of the left shoulder negative for any fracture. -Currently in sling for pain control.    DVT prophylaxis: Eliquis Code Status: Full Family Communication:  Updated patient.  No family at bedside. Disposition: Awaiting ability to sit in recliner for hemodialysis/home with home health therapies once cleared by nephrology.  Status is: Inpatient Remains inpatient appropriate because: Severity of illness   Consultants:  ID: Dr. [name] 02/10/2023 Wound care RN IR: Dr.De Melchor Amour 02/28/2023 Nephrology: Dr. Glenna Fellows 03/01/2023 Vascular surgery: Dr. Randie Heinz 03/11/2023 Palliative care: Dr. Linna Darner 03/13/2023  Procedures:  CT head 03/14/2023 Chest x-ray  03/12/2023, Placement of tunneled left-sided hemodialysis catheter: Per IR: Dr. Fredia Sorrow 03/12/2023 Plain films of left shoulder 03/14/2023 MRI T-spine 03/29/2023 Placement of left jugular nontunneled dialysis catheter using ultrasound and fluoroscopic guidance: Per IR, Dr. Lowella Dandy 03/02/2023 Renal ultrasound 03/01/2023 Upper extremity vein mapping 03/11/2023 Fluoroscopy guided T5 core biopsy and T4-5 disc aspiration: Per Dr.Katyucia, IR, 03/02/2023 Placement of left jugular type lumen dialysis catheter: Per IR, Dr. Lowella Dandy 03/02/2023 Left brachiocephalic AV fistula placement: Per vascular surgery: Dr. Chestine Spore 03/14/2023 Ultrasound-guided access left internal jugular vein, placement of left internal jugular vein tunneled dialysis catheter: Per Dr. Chestine Spore 03/12/2023 1 unit PRBC transfusion 03/17/2023 2 units PRBC transfusion pending 03/23/2023  Antimicrobials: Anti-infectives (From admission, onward)    Start     Dose/Rate Route Frequency Ordered Stop   03/14/23 0000  vancomycin (VANCOCIN) IVPB 1000 mg/200 mL premix        1,000 mg 200 mL/hr over 60 Minutes Intravenous To Surgery 03/13/23 0731 03/14/23 1452   03/07/23 0000  daptomycin (CUBICIN) IVPB        650 mg Intravenous Every 48 hours 03/06/23 1302 04/13/23 2359   02/27/23 1630  DAPTOmycin (CUBICIN) 650 mg in sodium chloride 0.9 % IVPB        8 mg/kg  82.1 kg 126 mL/hr over 30 Minutes Intravenous Every 48 hours 02/26/23 1615     02/26/23 1145  cefTRIAXone (ROCEPHIN) 2 g in sodium chloride 0.9 % 100 mL IVPB        2 g 200 mL/hr over 30 Minutes Intravenous  Once 02/26/23 1143 02/26/23 1510         Subjective: Patient in hemodialysis.  Sleeping but arousable.  Denies any further fevers overnight.  No chest pain.  No shortness of breath.  No abdominal pain.    Objective: Vitals:   03/23/23 0930 03/23/23 1000 03/23/23 1030 03/23/23 1100  BP: 91/60 (!) 107/46 (!) 115/52 (!) 114/49  Pulse: 78 77 78 79  Resp: 17 17 19 16   Temp:      TempSrc:       SpO2:  100% 99% 98%  Weight:      Height:        Intake/Output Summary (Last 24 hours) at 03/23/2023 1123 Last data filed at 03/22/2023 1304 Gross per 24 hour  Intake --  Output 20 ml  Net -20 ml    Filed Weights   03/21/23 0420 03/22/23 0409 03/23/23 0821  Weight: 41.9 kg 41.6 kg 41.9 kg    Examination:  General exam: NAD. Respiratory system: Lungs clear to auscultation bilaterally.  No wheezes, no crackles, no rhonchi.  Fair air movement.  Speaking in full sentences.    Cardiovascular system: RRR no murmurs rubs or gallops.  No JVD.  No lower extremity edema.  Gastrointestinal system: Abdomen is soft, nontender, nondistended, positive bowel sounds.  No rebound.  No guarding.  Central nervous system: Alert and oriented. No focal neurological deficits. Extremities: Symmetric 5 x 5 power. Skin: No rashes, lesions or ulcers Psychiatry: Judgement and insight appear normal. Mood & affect appropriate.  Data Reviewed: I have personally reviewed following labs and imaging studies  CBC: Recent Labs  Lab 03/19/23 0427 03/20/23 0248 03/21/23 0340 03/22/23 0356 03/23/23 0306  WBC 5.0 5.9 4.4 4.2 2.3*  HGB 7.7* 7.7* 7.2* 7.1* 6.1*  HCT 25.7* 25.5* 23.8* 23.2* 20.2*  MCV 93.8 94.4 96.0 93.9 93.5  PLT 117* 114* 103* 107* 96*     Basic Metabolic Panel: Recent Labs  Lab 03/19/23 0427 03/20/23 0248 03/21/23 0340 03/22/23 0356 03/23/23 0306  NA 131* 132* 128* 130* 127*  K 4.1 3.7 3.6 3.6 3.4*  CL 96* 96* 94* 94* 91*  CO2 27 27 27 27 26   GLUCOSE 97 98 135* 129* 103*  BUN 43* 25* 39* 20 40*  CREATININE 3.84* 2.70* 3.52* 2.63* 3.55*  CALCIUM 8.0* 8.3* 8.0* 7.9* 7.8*  PHOS 3.7 2.3* 3.1 3.1 3.3     GFR: Estimated Creatinine Clearance (by C-G formula based on SCr of 3.55 mg/dL (H)) Female: 16.1 mL/min (A) Female: 12 mL/min (A)  Liver Function Tests: Recent Labs  Lab 03/19/23 0427 03/20/23 0248 03/21/23 0340 03/22/23 0356 03/23/23 0306  AST 14*  --   --   --    --   ALT 9  --   --   --   --   ALKPHOS 118  --   --   --   --   BILITOT 1.2  --   --   --   --   PROT 6.5  --   --   --   --   ALBUMIN 2.5*  2.5* 2.6* 2.5* 2.4* 2.3*     CBG: Recent Labs  Lab 03/20/23 1830 03/20/23 2015 03/20/23 2339 03/21/23 0409 03/23/23 0748  GLUCAP 142* 130* 138* 116* 103*      No results found for this or any previous visit (from the past 240 hour(s)).       Radiology Studies: No results found.      Scheduled Meds:  sodium chloride   Intravenous Once   sodium chloride   Intravenous Once   acetaminophen  1,000 mg Oral TID   acetaminophen  650 mg Oral Once   acidophilus  2 capsule Oral TID   apixaban  2.5 mg Oral BID   calcitRIOL  0.5 mcg Oral Daily   calcium carbonate  1 tablet Oral Daily   Chlorhexidine Gluconate Cloth  6 each Topical Q0600   darbepoetin (ARANESP) injection - NON-DIALYSIS  100 mcg Subcutaneous Q Fri-1800   diclofenac  1 patch Transdermal BID   docusate sodium  100 mg Oral BID   DULoxetine  60 mg Oral Daily   feeding supplement (NEPRO CARB STEADY)  237 mL Oral TID BM   furosemide  80 mg Oral BID   lidocaine  2 patch Transdermal Q24H   methocarbamol  750 mg Oral TID   mometasone-formoterol  2 puff Inhalation BID   multivitamin  1 tablet Oral QHS   oxyCODONE  15 mg Oral Q12H   polyethylene glycol  17 g Oral Daily   pregabalin  75 mg Oral Daily   sodium chloride flush  10-40 mL Intracatheter Q12H   sodium chloride flush  3 mL Intravenous Q12H   Continuous Infusions:  DAPTOmycin (CUBICIN) 650 mg in sodium chloride 0.9 % IVPB Stopped (03/21/23 1855)     LOS: 24 days    Time spent: 35 minutes    Ramiro Harvest, MD [facility]   To contact the attending provider between 7A-7P or the covering  provider during after hours 7P-7A, please log into the web site [email or web address] and access using universal [place] password for that web site. If you do not have the password, please call the hospital  operator.  03/23/2023, 11:23 AM

## 2023-03-23 NOTE — Progress Notes (Signed)
Contacted renal NP regarding pt's out-pt HD arrangements at d/c in the event pt stable for d/c this weekend and orders need to be sent to clinic. Contacted [facility] and spoke to [place]. Clinic can start pt Monday if d/c this weekend and clinic confirms having iv dapto in-stock as well. Will assist as needed.   Olivia Canter Renal Navigator [phone]

## 2023-03-23 NOTE — Progress Notes (Signed)
OT Cancellation Note  Patient Details Name: [name] [name] MRN: [id number] DOB: 1956/06/08   Cancelled Treatment:    Reason Eval/Treat Not Completed: Patient at procedure or test/ unavailable.  At HD  Mcarthur Ivins D Wendal Wilkie 03/23/2023, 9:19 AM 03/23/2023  RP, OTR/L  Acute Rehabilitation Services  Office:  [phone]

## 2023-03-23 NOTE — Procedures (Signed)
HD Note:  Some information was entered later than the data was gathered due to patient care needs. The stated time with the data is accurate.  Received patient in bed to unit.  Alert and oriented.  Informed consent signed and in chart.   TX duration: 4.90 hours Patient arrived on the unit with report of 10/10 pain in her back and shoulders.  She was medicated as ordered, see MAR Patient started treatment with low BP.  UF turned off. Contacted Dr. Eddie North with new orders received to lower temp of machine to 36 degrees F, give albumin and midodrine.  See MAR. Patient received 2 units of blood per orders. Dialysis time was extended to allow for blood administration.  No adverse effects from the blood administration.  Transported back to the room  Alert, without acute distress.  Hand-off given to patient's nurse.   Access used: Upper Left chest HD catheter Access issues: reversed lines, no further issues  Total UF removed: 800 ml UF, 633 ml of blood administration fluids.   [facility]

## 2023-03-23 NOTE — Progress Notes (Signed)
    Patient Name: [name] [name]           DOB: 1956-08-01  MRN: [id number]      Admission Date: 02/26/2023  Attending Provider: Rodolph Bong, MD  Primary Diagnosis: Acute osteomyelitis of thoracic spine [facility])   Level of care: Med-Surg    CROSS COVER NOTE   Date of Service   03/23/2023   [name] [name], 67 y.o. adult, was admitted on 02/26/2023 for Acute osteomyelitis of thoracic spine (HCC).    HPI/Events of Note   Chronic anemia Hemoglobin 7.1--> 6.1.  No acute changes reported.  Hemodynamically stable. No melena, hematochezia, or other bleeding reported tonight. 1 unit PRBC has been ordered, nursing staff has been asked to coordinate with hemodialysis   Interventions/ Plan   Blood transfusion, 1 unit PRBC         Anthoney Harada, DNP, [employer] [facility]

## 2023-03-23 NOTE — Progress Notes (Signed)
   03/23/23 1500  Assess: MEWS Score  Temp (!) 103 F (39.4 C)  BP (!) 151/60  Pulse Rate (!) 113  Assess: MEWS Score  MEWS Temp 2  MEWS Systolic 0  MEWS Pulse 2  MEWS RR 2  MEWS LOC 0  MEWS Score 6  MEWS Score Color Red  Assess: if the MEWS score is Yellow or Red  Were vital signs taken at a resting state? Yes  Focused Assessment Change from prior assessment (see assessment flowsheet)  Does the patient meet 2 or more of the SIRS criteria? Yes  Does the patient have a confirmed or suspected source of infection? Yes  MEWS guidelines implemented  Yes, red  Treat  MEWS Interventions Considered administering scheduled or prn medications/treatments as ordered  Take Vital Signs  Increase Vital Sign Frequency  Red: Q1hr x2, continue Q4hrs until patient remains green for 12hrs  Escalate  MEWS: Escalate Red: Discuss with charge nurse and notify provider. Consider notifying RRT. If remains red for 2 hours consider need for higher level of care  Notify: Charge Nurse/RN  Name of Charge Nurse/RN Notified Quarry manager  Provider Notification  Provider Name/Title Dr. Janee Morn  Date Provider Notified 03/23/23  Time Provider Notified 1505  Method of Notification Page  Notification Reason Change in status  Provider response At bedside  Date of Provider Response 03/23/23  Time of Provider Response 1515  Notify: Rapid Response  Name of Rapid Response RN Notified [facility] RN  Date Rapid Response Notified 03/23/23  Time Rapid Response Notified 1518  Assess: SIRS CRITERIA  SIRS Temperature  1  SIRS Pulse 1  SIRS Respirations  1  SIRS WBC 1  SIRS Score Sum  4

## 2023-03-23 NOTE — Progress Notes (Signed)
Was called to the floor by RN that patient with complaints of chest pain, back pain, some complaints of shortness of breath with a temp of 103. Rapid response at bedside. General: Laying in bed, yelling out in pain. Respiratory: CTAB.  No wheezes, no crackles, no rhonchi.  Fair air movement.  Speaking in full sentences. Cardiovascular: Tachycardia.  No murmurs rubs or gallops.   GI: Abdomen is soft, nontender, nondistended, positive bowel sounds.  No rebound.  No guarding. Extremities: No clubbing cyanosis.  Trace lower extremity edema.  Assessment/plan 1.  Chest pain/back pain/shortness of breath -Patient with complaints of more back pain.  Denies any ongoing chest pain at the time of my exam. -Patient being treated for acute osteomyelitis, discitis of the T-spine T4, T5, patient with chronic pain. -EKG done with some T wave inversions in lead I and aVL new from prior EKG. -Check a chest x-ray, cycle cardiac enzymes, CBC with differential, 2D echo, renal panel. -Patient just back from hemodialysis. -Continue current pain regimen. -IV Dilaudid 0.5 mg IV x 1.  2.  Fever -Patient with a temp of 103 per RN this afternoon post hemodialysis. -Patient being treated for acute osteomyelitis, discitis of the T-spine at T4-T5 on IV daptomycin. -Check a CBC with differential, chest x-ray, blood cultures x 2. -Continue IV daptomycin. -On scheduled Tylenol. -Follow.   Time spent 35 minutes

## 2023-03-23 NOTE — Progress Notes (Signed)
This RN to bedside to collect labs from [facility]. Pt off the floor at that time.  Secure chat sent to RN.

## 2023-03-23 NOTE — Progress Notes (Signed)
Date and time results received: 03/23/23 1828 Phosphorous   Test: Phosphorous  Critical Value: 1.0  Name of Provider Notified: Dr. Janee Morn   Orders Received? Or Actions Taken?: Orders Received - See Orders for details

## 2023-03-23 NOTE — Procedures (Signed)
P/atient was seen on dialysis and the procedure was supervised.  BFR 400  Via TDC BP is  96/51. Lower temp to 36, albumin 25 g and midodrine 10 for IDH.  2 unit prbc.  Patient appears to be tolerating treatment well.  Jalin Alicea Jaynie Collins 03/23/2023

## 2023-03-23 NOTE — Progress Notes (Addendum)
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 TDC placement 03/12/23, appreciate assistance - CLIP to AF MWF 1145 (arrive 1115am 1st day) - Appreciate lt BCF by Dr. Chestine Spore on 5/15 -> great thrill -Continue to monitor daily Cr, Dose meds for GFR -Monitor Daily I/Os, Daily weight  -Maintain MAP>65 for optimal renal perfusion.  HD today, blood pressure is low therefore lower dialysate temperature and treated with albumin and midodrine.  Continue MWF schedule.  # Anemia: Continue ESA and monitor hemoglobin.  Hemoglobin 6.1 today therefore plan for blood transfusion.  # CKD-MBD: Phosphorus acceptable.  Continue calcitriol for secondary hyperparathyroidism.  # HTN/volume: BP low therefore DC Lasix.  Continue midodrine pre-HD for intradialytic hypotension.  Manage volume with HD.  # Osteomyelitis plan on daptomycin.  Last dose on 6/14. Ok to discharge from renal perspective.  Subjective: Seen and examined at dialysis unit.  Blood pressure low during HD therefore needed adjustment of the medications as discussed above.  She denies nausea, vomiting, chest pain, shortness of breath. Objective Vital signs in last 24 hours: Vitals:   03/23/23 1205 03/23/23 1230 03/23/23 1241 03/23/23 1245  BP:  (!) 113/56    Pulse:  93    Resp:  20    Temp: 97.7 F (36.5 C)  98.2 F (36.8 C) 98.2 F (36.8 C)  TempSrc: Oral  Oral Oral  SpO2:  95%    Weight:      Height:       Weight change:   Intake/Output Summary (Last 24 hours) at 03/23/2023 1301 Last data filed at 03/23/2023 1246 Gross per 24 hour  Intake 575 ml  Output 20 ml  Net 555 ml       Labs: RENAL PANEL Recent Labs  Lab 03/19/23 0427 03/20/23 0248 03/21/23 0340 03/22/23 0356 03/23/23 0306  NA 131* 132* 128* 130* 127*  K 4.1 3.7 3.6 3.6 3.4*  CL 96* 96* 94* 94* 91*  CO2 27 27 27 27 26   GLUCOSE 97 98 135* 129* 103*  BUN 43* 25* 39* 20 40*   CREATININE 3.84* 2.70* 3.52* 2.63* 3.55*  CALCIUM 8.0* 8.3* 8.0* 7.9* 7.8*  PHOS 3.7 2.3* 3.1 3.1 3.3  ALBUMIN 2.5*  2.5* 2.6* 2.5* 2.4* 2.3*    Liver Function Tests: Recent Labs  Lab 03/19/23 0427 03/20/23 0248 03/21/23 0340 03/22/23 0356 03/23/23 0306  AST 14*  --   --   --   --   ALT 9  --   --   --   --   ALKPHOS 118  --   --   --   --   BILITOT 1.2  --   --   --   --   PROT 6.5  --   --   --   --   ALBUMIN 2.5*  2.5*   < > 2.5* 2.4* 2.3*   < > = values in this interval not displayed.   No results for input(s): "LIPASE", "AMYLASE" in the last 168 hours. No results for input(s): "AMMONIA" in the last 168 hours. CBC: Recent Labs    07/26/22 1258 08/09/22 1343 09/06/22 1325 09/06/22 1326 09/07/22 1433 10/10/22 1308 11/08/22 1243 11/15/22 1334 12/06/22 1326 12/06/22 1327 12/13/22 1424 01/12/23 1419 01/28/23 1539 03/02/23 0402 03/02/23 1558 03/19/23 0427 03/20/23 0248 03/21/23 0340 03/22/23 0356 03/23/23 0306  HGB 8.0*   < > 10.0*  --  10.5*   < > 9.9*   < >  8.9*  --    < > 8.3*   < > 7.6*   < > 7.7* 7.7* 7.2* 7.1* 6.1*  MCV 90.7   < > 88.7  --  86.7   < > 91.2   < > 95.5  --    < > 93.3   < > 91.5   < > 93.8 94.4 96.0 93.9 93.5  VITAMINB12  --   --   --   --  273  --   --   --   --   --   --   --   --   --   --   --   --   --   --   --   FERRITIN 394*  --   --  401*  --   --  258  --   --  209  --  277  --   --   --   --   --   --   --   --   TIBC 232*  --  258  --   --   --  256  --  269  --   --  263  --  217*  --   --   --   --   --   --   IRON 64  --  69  --   --   --  53  --  46  --   --  32  --  70  --   --   --   --   --   --   RETICCTPCT 2.2  --   --  1.4  --   --  2.1  --   --  2.1  --  2.6  --   --   --   --   --   --   --   --    < > = values in this interval not displayed.    Cardiac Enzymes: Recent Labs  Lab 03/20/23 0248  CKTOTAL 63   CBG: Recent Labs  Lab 03/20/23 1830 03/20/23 2015 03/20/23 2339 03/21/23 0409 03/23/23 0748   GLUCAP 142* 130* 138* 116* 103*    Iron Studies: No results for input(s): "IRON", "TIBC", "TRANSFERRIN", "FERRITIN" in the last 72 hours. Studies/Results: No results found.  Medications: Infusions:  DAPTOmycin (CUBICIN) 650 mg in sodium chloride 0.9 % IVPB Stopped (03/21/23 1855)    Scheduled Medications:  sodium chloride   Intravenous Once   sodium chloride   Intravenous Once   acetaminophen  1,000 mg Oral TID   acetaminophen  650 mg Oral Once   acidophilus  2 capsule Oral TID   apixaban  2.5 mg Oral BID   calcitRIOL  0.5 mcg Oral Daily   calcium carbonate  1 tablet Oral Daily   Chlorhexidine Gluconate Cloth  6 each Topical Q0600   darbepoetin (ARANESP) injection - NON-DIALYSIS  100 mcg Subcutaneous Q Fri-1800   diclofenac  1 patch Transdermal BID   docusate sodium  100 mg Oral BID   DULoxetine  60 mg Oral Daily   feeding supplement (NEPRO CARB STEADY)  237 mL Oral TID BM   furosemide  80 mg Oral BID   lidocaine  2 patch Transdermal Q24H   methocarbamol  750 mg Oral TID   mometasone-formoterol  2 puff Inhalation BID   multivitamin  1 tablet Oral QHS   oxyCODONE  15 mg Oral Q12H  polyethylene glycol  17 g Oral Daily   pregabalin  75 mg Oral Daily   sodium chloride flush  10-40 mL Intracatheter Q12H   sodium chloride flush  3 mL Intravenous Q12H    have reviewed scheduled and prn medications.  Physical Exam: General:NAD, comfortable Heart:RRR, s1s2 nl Lungs:clear b/l, no crackle Abdomen:soft, Non-tender, non-distended Extremities:No edema Dialysis Access: [facility] TDC, L BCF + T/B    [name] [name] [name] [name] 03/23/2023,1:01 PM  LOS: 24 days

## 2023-03-23 NOTE — Progress Notes (Signed)
Patient to dialysis. 24hr urine collection sent with patient and dialysis nurse Vikki Ports made aware and that end time per night shift RN is 1300.

## 2023-03-24 ENCOUNTER — Other Ambulatory Visit (HOSPITAL_COMMUNITY): Payer: PRIVATE HEALTH INSURANCE

## 2023-03-24 ENCOUNTER — Encounter: Admit: 2023-03-24 | Discharge: 2023-03-24 | Payer: MEDICARE

## 2023-03-24 DIAGNOSIS — F32A Depression, unspecified: Secondary | ICD-10-CM | POA: Diagnosis not present

## 2023-03-24 DIAGNOSIS — M4624 Osteomyelitis of vertebra, thoracic region: Secondary | ICD-10-CM | POA: Diagnosis not present

## 2023-03-24 DIAGNOSIS — N185 Chronic kidney disease, stage 5: Secondary | ICD-10-CM | POA: Diagnosis not present

## 2023-03-24 DIAGNOSIS — I82503 Chronic embolism and thrombosis of unspecified deep veins of lower extremity, bilateral: Secondary | ICD-10-CM | POA: Diagnosis not present

## 2023-03-24 DIAGNOSIS — D649 Anemia, unspecified: Secondary | ICD-10-CM

## 2023-03-24 DIAGNOSIS — E1129 Type 2 diabetes mellitus with other diabetic kidney complication: Secondary | ICD-10-CM

## 2023-03-24 LAB — RENAL FUNCTION PANEL
Albumin: 2.6 g/dL — ABNORMAL LOW (ref 3.5–5.0)
Anion gap: 10 (ref 5–15)
BUN: 19 mg/dL (ref 8–23)
CO2: 26 mmol/L (ref 22–32)
Calcium: 7.7 mg/dL — ABNORMAL LOW (ref 8.9–10.3)
Chloride: 93 mmol/L — ABNORMAL LOW (ref 98–111)
Creatinine, Ser: 2.3 mg/dL — ABNORMAL HIGH (ref 0.44–1.00)
GFR, Estimated: 23 mL/min — ABNORMAL LOW (ref >60)
Glucose, Bld: 117 mg/dL — ABNORMAL HIGH (ref 70–99)
Phosphorus: 4.2 mg/dL (ref 2.5–4.6)
Potassium: 3.5 mmol/L (ref 3.5–5.1)
Sodium: 129 mmol/L — ABNORMAL LOW (ref 135–145)

## 2023-03-24 LAB — BPAM RBC
Blood Product Expiration Date: 202405292359
Blood Product Expiration Date: 202406112359
ISSUE DATE / TIME: 202405241107
ISSUE DATE / TIME: 202405241107
Unit Type and Rh: 9500

## 2023-03-24 LAB — TYPE AND SCREEN
ABO/RH(D): A NEG
Antibody Screen: NEGATIVE
Unit division: 0

## 2023-03-24 LAB — CBC WITH DIFFERENTIAL/PLATELET
Abs Immature Granulocytes: 0.01 10*3/uL (ref 0.00–0.07)
Basophils Absolute: 0 10*3/uL (ref 0.0–0.1)
Basophils Relative: 1 %
Eosinophils Absolute: 0.1 10*3/uL (ref 0.0–0.5)
Eosinophils Relative: 2 %
HCT: 25.2 % — ABNORMAL LOW (ref 36.0–46.0)
Hemoglobin: 8.1 g/dL — ABNORMAL LOW (ref 12.0–15.0)
Immature Granulocytes: 0 %
Lymphocytes Relative: 11 %
Lymphs Abs: 0.4 10*3/uL — ABNORMAL LOW (ref 0.7–4.0)
MCH: 29 pg (ref 26.0–34.0)
MCHC: 32.1 g/dL (ref 30.0–36.0)
MCV: 90.3 fL (ref 80.0–100.0)
Monocytes Absolute: 0.5 10*3/uL (ref 0.1–1.0)
Monocytes Relative: 15 %
Neutro Abs: 2.4 10*3/uL (ref 1.7–7.7)
Neutrophils Relative %: 71 %
Platelets: 103 10*3/uL — ABNORMAL LOW (ref 150–400)
RBC: 2.79 MIL/uL — ABNORMAL LOW (ref 3.87–5.11)
RDW: 17.3 % — ABNORMAL HIGH (ref 11.5–15.5)
WBC: 3.4 10*3/uL — ABNORMAL LOW (ref 4.0–10.5)
nRBC: 0 % (ref 0.0–0.2)

## 2023-03-24 LAB — CULTURE, BLOOD (ROUTINE X 2): Culture: NO GROWTH

## 2023-03-24 MED ORDER — FLUTICASONE PROPIONATE 50 MCG/ACTUATION NA SPSN
3 refills
Start: 2023-03-24 — End: ?

## 2023-03-24 MED ORDER — ACCU-CHEK GUIDE TEST STRIPS MISC STRP
ORAL_STRIP | 3 refills
Start: 2023-03-24 — End: ?

## 2023-03-24 NOTE — Progress Notes (Signed)
PROGRESS NOTE    [name] [name]  BJY:[id number] DOB: Jan 27, 1956 DOA: 02/26/2023 PCP: Donato Schultz, DO    Chief Complaint  Patient presents with   Abdominal Pain   Back Pain    Brief Narrative: PMH of type II DM, neuropathy, thrombocytopenia, OSA, osteomyelitis, hypothyroidism, HTN, CAD, cirrhosis, ESRD now on HD, spinal stenosis SP lumbar fusion, history of renal transplant, PTSD, depression, obesity, fibromyalgia, low-dose metoprolol doses.  BMI baseline.  Presented to the hospital with complaints of back pain.  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  4/29 admitted to the hospital.  MRI controllable Looking for vertebral body, new marrow edema T3.  7 mm epidural fluid collection. 5/2 nephrology was consulted for AKI on CKD 5. 5/3 underwent IR guided T5 bone biopsy and T4-T5 disc aspiration.  IR also placed temp HD catheter for HD. 5/12 vascular surgery was consulted. 5/13 left TDC placement with vascular surgery. 5/14 palliative care consulted for pain management 5/15 underwent aVF placement left brachial cephalic. Had a Fall in the OR while being transferred from the OR table. 5/17 Accepted at [facility] SW G[facility] TTS pending pt's ability to sit for extended period of time. 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 -Imaging of MRI T-spine done 02/27/2023 with persistent T4-T5 discitis/osteomyelitis, in comparison to January 28, 2023, mild interval collapse in the T4 vertebral body.  Mild new marrow edema at T3 is new and likely represents osteomyelitis.  Mild interval increase in size of suspected dorsal epidural fluid collection measuring 7 mm, possibly abscess or phlegmon. -Blood cultures obtained 02/26/2023 were negative. -IR consulted patient underwent biopsy with no growth to date from aspiration. -Patient seen in consultation by ID who recommended IV daptomycin through 04/13/2023 with outpatient follow-up scheduled 04/12/2023.  2.  Pain control/chronic anxiety -Patient assessed by palliative care, prior to admission patient noted to be on Norco 5/325 4 times daily as needed also noted to have been on Valium before in the past.  Patient also noted on Lyrica 75 mg 3 times daily. -Patient seen by palliative care who are managing patient's pain. -Patient currently on OxyContin 15 mg p.o. twice daily, oxycodone 5 to 10 mg p.o. every 4 hours as needed breakthrough pain, Cymbalta 60 mg  daily, Lyrica 75 mg daily, scheduled Tylenol 1000 mg 3 times daily, diclofenac 1.3% patch twice daily, lidocaine patch 5% 2 patches daily, Robaxin-750 milligrams 3 times daily.  K-pad as tolerated. -Fentanyl discontinued by palliative care. -Patient will need to follow-up with PCP in the outpatient setting for further  management of her pain and if need be may need a referral to the pain clinic from her PCP.   -Palliative care following and helping manage patient's pain in-house.  -Per palliative care.  3.  ATN/AKI on CKD 5>>>> ESRD -Patient noted to have a baseline creatinine around 4, serum creatinine peaked at 8 leading to initiation of HD 5/3. -TDC placed 03/12/2023 -AVF placement 03/14/2023 -Patient will need to be able to tolerate HD in recliner for outpatient HD, noted to have been able to tolerate HD on 5/20 but had significant pain after that. -It is noted that patient was informed if hemodialysis went well on Wednesday and Friday she might be close to being discharged from the hospital. -Nephrology following and appreciate input and recommendations.  4.  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 -Hemoglobin A1c 4.6 (02/14/2022) -Repeat hemoglobin A1c at 4.7 (03/21/2023) -Follow.  5.  Diarrhea/C. difficile antigen positive but toxigenic PCR negative/constipation -Patient noted to have had diarrhea workup which showed that patient was to was positive for C. difficile antigen.  Toxigenic PCR negative. -Patient noted to have received questran which resulted in constipation. -Currently on bowel regimen of Colace twice daily,  6.  Urinary retention -Patient noted to have urinary retention early on in the hospitalization had Foley catheter placed 5/2 which was removed on 5/9 and required placement again on 5/10, no prior history of urinary retention. -Patient also noted now ESRD on HD. -Foley catheter removed 03/19/2023. -Urine output of 375 cc recorded over the past 24 hours.  -Bladder scan done with 145 cc noted.   -Monitor urine output.  7.  Left lower extremity wound -Continue current wound care dressings with Aquacel, wound care RN following. Pressure Injury 02/26/23 Heel Left (Active)  02/26/23  1826  Location: Heel  Location Orientation: Left  Staging:   Wound Description (Comments):   Present on Admission: Yes   8 chronic thrombocytopenia -Secondary to cirrhosis. -Stable. -No overt bleeding.  9.  Normocytic anemia -Iron studies consistent with anemia of chronic disease. -Hemoglobin noted to have dropped down to 6.8 on 5/18, status post transfusion 1 unit PRBCs. -Hemoglobin with hemoglobin of 6.1 on 03/23/2023 status post 2 more units PRBC transfusion with hemoglobin currently stable at 8.1 today.  -Status post IV iron in EPO support. -Transfusion threshold hemoglobin < 7. -Nephrology following.  10.  Chronic diastolic CHF -Volume management per hemodialysis. -Lasix discontinued.  -On HD.    11.  Hypertension -BP soft.  Patient was on Lasix which has subsequently been discontinued.   12.  Depression/anxiety -Continue Cymbalta, diazepam.   13.  Chronic lower extremity DVTs -Continue Eliquis.   14.  Fall in the OR -Patient noted to have had a fall in the OR while being transferred from the OR table to the stretcher. -CT chest abdomen and pelvis with no acute trauma noted. -CT head negative. -Plain films of the left shoulder negative for any fracture.  15.  Fever -Patient noted to have a temp of 103 yesterday afternoon 03/13/2023 post hemodialysis. -Chest x-ray obtained with no acute infiltrate. -Blood cultures ordered and pending. -Patient afebrile  over the past 24 hours. -Continue IV daptomycin, scheduled Tylenol.  16.  Chest pain/back pain/shortness of breath -Patient noted on 03/23/2023 with complaints of back pain and some chest pain. -Patient noted to be treated for acute osteomyelitis, discitis of the T-spine T4, T5 patient with ongoing chronic pain. -EKG done 03/23/2023 with some T wave inversions in lead I and aVL new from prior EKG. -Cardiac enzymes flattened.  Chest x-ray no acute infiltrate.  2D echo pending. -Currently chest pain-free. -Continue  current pain regimen.    DVT prophylaxis: Eliquis Code Status: Full Family Communication: Updated patient.  No family at bedside. Disposition: Awaiting ability to sit in recliner for hemodialysis/home with home health therapies once cleared by nephrology.  Status is: Inpatient Remains inpatient appropriate because: Severity of illness   Consultants:  ID: Dr. [name] 02/10/2023 Wound care RN IR: Dr.De Melchor Amour 02/28/2023 Nephrology: Dr. Glenna Fellows 03/01/2023 Vascular surgery: Dr. Randie Heinz 03/11/2023 Palliative care: Dr. Linna Darner 03/13/2023  Procedures:  CT head 03/14/2023 Chest x-ray 03/12/2023, Placement of tunneled left-sided hemodialysis catheter: Per IR: Dr. Fredia Sorrow 03/12/2023 Plain films of left shoulder 03/14/2023 MRI T-spine 03/29/2023 Placement of left jugular nontunneled dialysis catheter using ultrasound and fluoroscopic guidance: Per IR, Dr. Lowella Dandy 03/02/2023 Renal ultrasound 03/01/2023 Upper extremity vein mapping 03/11/2023 Fluoroscopy guided T5 core biopsy and T4-5 disc aspiration: Per Dr.Katyucia, IR, 03/02/2023 Placement of left jugular type lumen dialysis catheter: Per IR, Dr. Lowella Dandy 03/02/2023 Left brachiocephalic AV fistula placement: Per vascular surgery: Dr. Chestine Spore 03/14/2023 Ultrasound-guided access left internal jugular vein, placement of left internal jugular vein tunneled dialysis catheter: Per Dr. Chestine Spore 03/12/2023 1 unit PRBC transfusion 03/17/2023 2 units PRBC transfusion 03/23/2023  Antimicrobials: Anti-infectives (From admission, onward)    Start     Dose/Rate Route Frequency Ordered Stop   03/14/23 0000  vancomycin (VANCOCIN) IVPB 1000 mg/200 mL premix        1,000 mg 200 mL/hr over 60 Minutes Intravenous To Surgery 03/13/23 0731 03/14/23 1452   03/07/23 0000  daptomycin (CUBICIN) IVPB        650 mg Intravenous Every 48 hours 03/06/23 1302 04/13/23 2359   02/27/23 1630  DAPTOmycin (CUBICIN) 650 mg in sodium chloride 0.9 % IVPB        8 mg/kg  82.1 kg 126 mL/hr over 30  Minutes Intravenous Every 48 hours 02/26/23 1615     02/26/23 1145  cefTRIAXone (ROCEPHIN) 2 g in sodium chloride 0.9 % 100 mL IVPB        2 g 200 mL/hr over 30 Minutes Intravenous  Once 02/26/23 1143 02/26/23 1510         Subjective: Sleeping but easily arousable.  Some complaints of anxiety.  No further chest pain.  No shortness of breath.  No abdominal pain.  Has remained afebrile since yesterday afternoon.   Objective: Vitals:   03/24/23 0500 03/24/23 0541 03/24/23 0804 03/24/23 0910  BP:  (!) 146/61 (!) 122/56   Pulse:  78 78   Resp:  17 18   Temp:  99.3 F (37.4 C) 97.9 F (36.6 C)   TempSrc:  Axillary Oral   SpO2:  99% 100% 100%  Weight: 84.5 kg     Height:        Intake/Output Summary (Last 24 hours) at 03/24/2023 1134 Last data filed at 03/24/2023 0941 Gross per 24 hour  Intake 1888.09 ml  Output 1175 ml  Net 713.09 ml    Filed Weights   03/22/23 0409 03/23/23 0821 03/24/23 0500  Weight: 41.6 kg  41.9 kg 84.5 kg    Examination:  General exam: NAD Respiratory system: CTAB.  No wheezes, no crackles, no rhonchi.  Fair air movement.  Cardiovascular system: Regular rate rhythm no murmurs rubs or gallops.  No JVD.  No lower extremity edema.  Gastrointestinal system: Abdomen is soft, nontender, nondistended, positive bowel sounds.  No rebound.  No guarding.  Central nervous system: Alert and oriented. No focal neurological deficits. Extremities: Symmetric 5 x 5 power. Skin: No rashes, lesions or ulcers Psychiatry: Judgement and insight appear normal. Mood & affect appropriate.     Data Reviewed: I have personally reviewed following labs and imaging studies  CBC: Recent Labs  Lab 03/21/23 0340 03/22/23 0356 03/23/23 0306 03/23/23 1635 03/24/23 0306  WBC 4.4 4.2 2.3* 3.5* 3.4*  NEUTROABS  --   --   --  3.2 2.4  HGB 7.2* 7.1* 6.1* 8.5* 8.1*  HCT 23.8* 23.2* 20.2* 26.2* 25.2*  MCV 96.0 93.9 93.5 88.8 90.3  PLT 103* 107* 96* 91* 103*     Basic  Metabolic Panel: Recent Labs  Lab 03/21/23 0340 03/22/23 0356 03/23/23 0306 03/23/23 1635 03/24/23 0306  NA 128* 130* 127* 129* 129*  K 3.6 3.6 3.4* 3.3* 3.5  CL 94* 94* 91* 92* 93*  CO2 27 27 26 23 26   GLUCOSE 135* 129* 103* 131* 117*  BUN 39* 20 40* 12 19  CREATININE 3.52* 2.63* 3.55* 1.69* 2.30*  CALCIUM 8.0* 7.9* 7.8* 8.1* 7.7*  PHOS 3.1 3.1 3.3 1.0* 4.2     GFR: Estimated Creatinine Clearance (by C-G formula based on SCr of 2.3 mg/dL (H)) Female: 09.8 mL/min (A) Female: 27.5 mL/min (A)  Liver Function Tests: Recent Labs  Lab 03/19/23 0427 03/20/23 0248 03/21/23 0340 03/22/23 0356 03/23/23 0306 03/23/23 1635 03/24/23 0306  AST 14*  --   --   --   --   --   --   ALT 9  --   --   --   --   --   --   ALKPHOS 118  --   --   --   --   --   --   BILITOT 1.2  --   --   --   --   --   --   PROT 6.5  --   --   --   --   --   --   ALBUMIN 2.5*  2.5*   < > 2.5* 2.4* 2.3* 3.0* 2.6*   < > = values in this interval not displayed.     CBG: Recent Labs  Lab 03/20/23 2015 03/20/23 2339 03/21/23 0409 03/23/23 0748 03/23/23 1614  GLUCAP 130* 138* 116* 103* 133*      Recent Results (from the past 240 hour(s))  Culture, blood (Routine X 2) w Reflex to ID Panel     Status: None (Preliminary result)   Collection Time: 03/23/23  4:22 PM   Specimen: BLOOD  Result Value Ref Range Status   Specimen Description BLOOD RIGHT ANTECUBITAL  Final   Special Requests   Final    BOTTLES DRAWN AEROBIC AND ANAEROBIC Blood Culture adequate volume   Culture   Final    NO GROWTH < 24 HOURS Performed at [facility], [address], [place], [place] [address]    Report Status PENDING  Incomplete  Culture, blood (Routine X 2) w Reflex to ID Panel     Status: None (Preliminary result)   Collection Time: 03/23/23  4:52 PM   Specimen: BLOOD RIGHT HAND  Result Value Ref Range Status   Specimen Description BLOOD RIGHT HAND  Final   Special Requests   Final    BOTTLES DRAWN  AEROBIC ONLY Blood Culture adequate volume   Culture   Final    NO GROWTH < 24 HOURS Performed at [facility], [address], [place], [place] [address]    Report Status PENDING  Incomplete         Radiology Studies: DG CHEST PORT 1 VIEW  Result Date: 03/23/2023 CLINICAL DATA:  [id number] Chest pain [id number] EXAM: PORTABLE CHEST 1 VIEW COMPARISON:  CXR 03/12/23 FINDINGS: Bilateral central venous catheters in place with unchanged positioning. No pleural effusion. No pneumothorax. No new focal airspace opacity. Unchanged cardiac and mediastinal contours. Prominent bilateral interstitial opacities are favored to represent pulmonary venous congestion. No radiographically apparent new displaced rib fracture. Chronic left clavicular and humeral head fractures. Thoracolumbar spinal fusion hardware in place. IMPRESSION: No focal airspace opacity Electronically Signed   By: Lorenza Cambridge M.D.   On: 03/23/2023 17:26        Scheduled Meds:  sodium chloride   Intravenous Once   sodium chloride   Intravenous Once   acetaminophen  1,000 mg Oral TID   acetaminophen  650 mg Oral Once   acidophilus  2 capsule Oral TID   apixaban  2.5 mg Oral BID   calcitRIOL  0.5 mcg Oral Daily   calcium carbonate  1 tablet Oral Daily   Chlorhexidine Gluconate Cloth  6 each Topical Q0600   darbepoetin (ARANESP) injection - NON-DIALYSIS  100 mcg Subcutaneous Q Fri-1800   diclofenac  1 patch Transdermal BID   docusate sodium  100 mg Oral BID   DULoxetine  60 mg Oral Daily   feeding supplement (NEPRO CARB STEADY)  237 mL Oral TID BM   lidocaine  2 patch Transdermal Q24H   methocarbamol  750 mg Oral TID   mometasone-formoterol  2 puff Inhalation BID   multivitamin  1 tablet Oral QHS   oxyCODONE  15 mg Oral Q12H   polyethylene glycol  17 g Oral Daily   pregabalin  75 mg Oral Daily   sodium chloride flush  10-40 mL Intracatheter Q12H   sodium chloride flush  3 mL Intravenous Q12H   Continuous Infusions:   DAPTOmycin (CUBICIN) 650 mg in sodium chloride 0.9 % IVPB Stopped (03/23/23 1756)     LOS: 25 days    Time spent: 35 minutes    Ramiro Harvest, MD [facility]   To contact the attending provider between 7A-7P or the covering provider during after hours 7P-7A, please log into the web site [email or web address] and access using universal [place] password for that web site. If you do not have the password, please call the hospital operator.  03/24/2023, 11:34 AM

## 2023-03-24 NOTE — Progress Notes (Signed)
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 TDC placement 03/12/23, appreciate assistance - CLIP to [facility] MWF 1145 (arrive 1115am 1st day) - Appreciate lt BCF by Dr. Chestine Spore on 5/15 -> great thrill -s/p HD on 5/24, had intradialytic hypotension required albumin and midodrine.  Plan for next HD on 5/27. - Continue MWF schedule.  # Anemia: Continue ESA and monitor hemoglobin.  Required blood transfusion..  # CKD-MBD: Apparently the phosphorus level was low yesterday therefore received phosphorus repletion.  Phosphorus 4.2 today. Continue calcitriol for secondary hyperparathyroidism.  # HTN/volume: BP low therefore DC Lasix.  Continue midodrine pre-HD for intradialytic hypotension.  Manage volume with HD.  # Osteomyelitis plan on daptomycin.  Last dose on 6/14. Ok to discharge from renal perspective.  # Hyponatremia, hypervolemic: Managed with dialysis.  Recommend fluid restriction.  # Acute febrile illness: Chest x-ray negative.  On antibiotics.  Afebrile this morning.  Subjective: Seen and examined.  She was anxious about her medical condition.  Denies nausea, vomiting, chest pain, shortness of breath. Objective Vital signs in last 24 hours: Vitals:   03/24/23 0500 03/24/23 0541 03/24/23 0804 03/24/23 0910  BP:  (!) 146/61 (!) 122/56   Pulse:  78 78   Resp:  17 18   Temp:  99.3 F (37.4 C) 97.9 F (36.6 C)   TempSrc:  Axillary Oral   SpO2:  99% 100% 100%  Weight: 84.5 kg     Height:       Weight change:   Intake/Output Summary (Last 24 hours) at 03/24/2023 1040 Last data filed at 03/24/2023 0941 Gross per 24 hour  Intake 1888.09 ml  Output 1175 ml  Net 713.09 ml        Labs: RENAL PANEL Recent Labs  Lab 03/21/23 0340 03/22/23 0356 03/23/23 0306 03/23/23 1635 03/24/23 0306  NA 128* 130* 127* 129* 129*  K 3.6 3.6 3.4* 3.3* 3.5  CL 94* 94* 91* 92* 93*  CO2 27 27 26 23 26   GLUCOSE 135*  129* 103* 131* 117*  BUN 39* 20 40* 12 19  CREATININE 3.52* 2.63* 3.55* 1.69* 2.30*  CALCIUM 8.0* 7.9* 7.8* 8.1* 7.7*  PHOS 3.1 3.1 3.3 1.0* 4.2  ALBUMIN 2.5* 2.4* 2.3* 3.0* 2.6*     Liver Function Tests: Recent Labs  Lab 03/19/23 0427 03/20/23 0248 03/23/23 0306 03/23/23 1635 03/24/23 0306  AST 14*  --   --   --   --   ALT 9  --   --   --   --   ALKPHOS 118  --   --   --   --   BILITOT 1.2  --   --   --   --   PROT 6.5  --   --   --   --   ALBUMIN 2.5*  2.5*   < > 2.3* 3.0* 2.6*   < > = values in this interval not displayed.    No results for input(s): "LIPASE", "AMYLASE" in the last 168 hours. No results for input(s): "AMMONIA" in the last 168 hours. CBC: Recent Labs    07/26/22 1258 08/09/22 1343 09/06/22 1325 09/06/22 1326 09/07/22 1433 10/10/22 1308 11/08/22 1243 11/15/22 1334 12/06/22 1326 12/06/22 1327 12/13/22 1424 01/12/23 1419 01/28/23 1539 03/02/23 0402 03/02/23 1558 03/21/23 0340 03/22/23 0356 03/23/23 0306 03/23/23 1635 03/24/23 0306  HGB 8.0*   < > 10.0*  --  10.5*   < > 9.9*   < >  8.9*  --    < > 8.3*   < > 7.6*   < > 7.2* 7.1* 6.1* 8.5* 8.1*  MCV 90.7   < > 88.7  --  86.7   < > 91.2   < > 95.5  --    < > 93.3   < > 91.5   < > 96.0 93.9 93.5 88.8 90.3  VITAMINB12  --   --   --   --  273  --   --   --   --   --   --   --   --   --   --   --   --   --   --   --   FERRITIN 394*  --   --  401*  --   --  258  --   --  209  --  277  --   --   --   --   --   --   --   --   TIBC 232*  --  258  --   --   --  256  --  269  --   --  263  --  217*  --   --   --   --   --   --   IRON 64  --  69  --   --   --  53  --  46  --   --  32  --  70  --   --   --   --   --   --   RETICCTPCT 2.2  --   --  1.4  --   --  2.1  --   --  2.1  --  2.6  --   --   --   --   --   --   --   --    < > = values in this interval not displayed.     Cardiac Enzymes: Recent Labs  Lab 03/20/23 0248  CKTOTAL 63    CBG: Recent Labs  Lab 03/20/23 2015 03/20/23 2339  03/21/23 0409 03/23/23 0748 03/23/23 1614  GLUCAP 130* 138* 116* 103* 133*     Iron Studies: No results for input(s): "IRON", "TIBC", "TRANSFERRIN", "FERRITIN" in the last 72 hours. Studies/Results: DG CHEST PORT 1 VIEW  Result Date: 03/23/2023 CLINICAL DATA:  [id number] Chest pain [id number] EXAM: PORTABLE CHEST 1 VIEW COMPARISON:  CXR 03/12/23 FINDINGS: Bilateral central venous catheters in place with unchanged positioning. No pleural effusion. No pneumothorax. No new focal airspace opacity. Unchanged cardiac and mediastinal contours. Prominent bilateral interstitial opacities are favored to represent pulmonary venous congestion. No radiographically apparent new displaced rib fracture. Chronic left clavicular and humeral head fractures. Thoracolumbar spinal fusion hardware in place. IMPRESSION: No focal airspace opacity Electronically Signed   By: Lorenza Cambridge M.D.   On: 03/23/2023 17:26    Medications: Infusions:  DAPTOmycin (CUBICIN) 650 mg in sodium chloride 0.9 % IVPB Stopped (03/23/23 1756)    Scheduled Medications:  sodium chloride   Intravenous Once   sodium chloride   Intravenous Once   acetaminophen  1,000 mg Oral TID   acetaminophen  650 mg Oral Once   acidophilus  2 capsule Oral TID   apixaban  2.5 mg Oral BID   calcitRIOL  0.5 mcg Oral Daily   calcium carbonate  1 tablet Oral Daily   Chlorhexidine Gluconate Cloth  6 each Topical  Q0600   darbepoetin (ARANESP) injection - NON-DIALYSIS  100 mcg Subcutaneous Q Fri-1800   diclofenac  1 patch Transdermal BID   docusate sodium  100 mg Oral BID   DULoxetine  60 mg Oral Daily   feeding supplement (NEPRO CARB STEADY)  237 mL Oral TID BM   lidocaine  2 patch Transdermal Q24H   methocarbamol  750 mg Oral TID   mometasone-formoterol  2 puff Inhalation BID   multivitamin  1 tablet Oral QHS   oxyCODONE  15 mg Oral Q12H   polyethylene glycol  17 g Oral Daily   pregabalin  75 mg Oral Daily   sodium chloride flush  10-40 mL Intracatheter  Q12H   sodium chloride flush  3 mL Intravenous Q12H    have reviewed scheduled and prn medications.  Physical Exam: General:NAD, comfortable Heart:RRR, s1s2 nl Lungs:clear b/l, no crackle Abdomen:soft, Non-tender, non-distended Extremities:No edema Dialysis Access: [facility] TDC, L BCF + T/B    Christeena Krogh Prasad Randolf Sansoucie 03/24/2023,10:40 AM  LOS: 25 days

## 2023-03-25 ENCOUNTER — Inpatient Hospital Stay (HOSPITAL_COMMUNITY): Payer: PRIVATE HEALTH INSURANCE

## 2023-03-25 DIAGNOSIS — R079 Chest pain, unspecified: Secondary | ICD-10-CM | POA: Diagnosis not present

## 2023-03-25 DIAGNOSIS — M4624 Osteomyelitis of vertebra, thoracic region: Secondary | ICD-10-CM | POA: Diagnosis not present

## 2023-03-25 DIAGNOSIS — K7030 Alcoholic cirrhosis of liver without ascites: Secondary | ICD-10-CM | POA: Diagnosis not present

## 2023-03-25 DIAGNOSIS — D508 Other iron deficiency anemias: Secondary | ICD-10-CM

## 2023-03-25 DIAGNOSIS — I10 Essential (primary) hypertension: Secondary | ICD-10-CM | POA: Diagnosis not present

## 2023-03-25 DIAGNOSIS — M869 Osteomyelitis, unspecified: Secondary | ICD-10-CM

## 2023-03-25 DIAGNOSIS — I2510 Atherosclerotic heart disease of native coronary artery without angina pectoris: Secondary | ICD-10-CM | POA: Diagnosis not present

## 2023-03-25 LAB — RENAL FUNCTION PANEL
Albumin: 2.5 g/dL — ABNORMAL LOW (ref 3.5–5.0)
Anion gap: 10 (ref 5–15)
BUN: 30 mg/dL — ABNORMAL HIGH (ref 8–23)
CO2: 26 mmol/L (ref 22–32)
Calcium: 7.7 mg/dL — ABNORMAL LOW (ref 8.9–10.3)
Chloride: 91 mmol/L — ABNORMAL LOW (ref 98–111)
Creatinine, Ser: 3.14 mg/dL — ABNORMAL HIGH (ref 0.44–1.00)
GFR, Estimated: 16 mL/min — ABNORMAL LOW (ref >60)
Glucose, Bld: 82 mg/dL (ref 70–99)
Phosphorus: 3.7 mg/dL (ref 2.5–4.6)
Potassium: 3.2 mmol/L — ABNORMAL LOW (ref 3.5–5.1)
Sodium: 127 mmol/L — ABNORMAL LOW (ref 135–145)

## 2023-03-25 LAB — ECHOCARDIOGRAM COMPLETE
AR max vel: 2.48 cm2
AV Area VTI: 2.3 cm2
AV Area mean vel: 2.38 cm2
AV Mean grad: 4 mmHg
AV Peak grad: 7.1 mmHg
Ao pk vel: 1.33 m/s
Area-P 1/2: 4.21 cm2
Height: 59 in
S' Lateral: 2.5 cm
Weight: 2980.62 oz

## 2023-03-25 LAB — CBC
HCT: 23.7 % — ABNORMAL LOW (ref 36.0–46.0)
Hemoglobin: 7.9 g/dL — ABNORMAL LOW (ref 12.0–15.0)
MCH: 29.5 pg (ref 26.0–34.0)
MCHC: 33.3 g/dL (ref 30.0–36.0)
MCV: 88.4 fL (ref 80.0–100.0)
Platelets: 120 10*3/uL — ABNORMAL LOW (ref 150–400)
RBC: 2.68 MIL/uL — ABNORMAL LOW (ref 3.87–5.11)
RDW: 17.2 % — ABNORMAL HIGH (ref 11.5–15.5)
WBC: 3.5 10*3/uL — ABNORMAL LOW (ref 4.0–10.5)
nRBC: 0 % (ref 0.0–0.2)

## 2023-03-25 LAB — CULTURE, BLOOD (ROUTINE X 2)

## 2023-03-25 MED ORDER — IOHEXOL 9 MG/ML PO SOLN
500.0000 mL | ORAL | Status: AC
  Administered 2023-03-25 (×2): 500 mL via ORAL

## 2023-03-25 MED ORDER — POTASSIUM CHLORIDE CRYS ER 20 MEQ PO TBCR
20.0000 meq | EXTENDED_RELEASE_TABLET | Freq: Once | ORAL | Status: AC
  Administered 2023-03-25: 20 meq via ORAL
  Filled 2023-03-25: qty 1

## 2023-03-25 MED ORDER — CHLORHEXIDINE GLUCONATE CLOTH 2 % EX PADS
6.0000 | MEDICATED_PAD | Freq: Every day | CUTANEOUS | Status: DC
  Administered 2023-03-25 – 2023-03-27 (×3): 6 via TOPICAL

## 2023-03-25 MED ORDER — BISACODYL 10 MG RE SUPP
10.0000 mg | Freq: Once | RECTAL | Status: AC
  Administered 2023-03-25: 10 mg via RECTAL
  Filled 2023-03-25: qty 1

## 2023-03-25 NOTE — Progress Notes (Signed)
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  BMI baseline.  Presented to the hospital with complaints of back pain.  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  4/29 admitted to the hospital.  MRI controllable Looking for vertebral body, new marrow edema T3.  7 mm epidural fluid collection. 5/2 nephrology was consulted for AKI on CKD 5. 5/3 underwent IR guided T5 bone biopsy and T4-T5 disc aspiration.  IR also placed temp HD catheter for HD. 5/12 vascular surgery was consulted. 5/13 left TDC placement with vascular surgery. 5/14 palliative care consulted for pain management 5/15 underwent aVF placement left brachial cephalic. Had a Fall in the OR while being transferred from the OR table. 5/17 Accepted at [facility] SW GBO TTS pending pt's ability to sit for extended period of time. 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 -Imaging of MRI T-spine done 02/27/2023 with persistent T4-T5 discitis/osteomyelitis, in comparison to January 28, 2023, mild interval collapse in the T4 vertebral body.  Mild new marrow edema at T3 is new and likely represents osteomyelitis.  Mild interval increase in size of suspected dorsal epidural fluid collection measuring 7 mm, possibly abscess or phlegmon. -Blood cultures obtained 02/26/2023 were negative. -IR consulted patient underwent biopsy with no growth to date from aspiration. -Patient seen in consultation by ID who recommended IV daptomycin through 04/13/2023 with outpatient follow-up scheduled 04/12/2023.  2.  Pain control/chronic anxiety -Patient assessed by palliative care, prior to admission patient noted to be on Norco 5/325 4 times daily as needed also noted to have been on Valium before in the past.  Patient also noted on Lyrica 75 mg 3 times daily. -Patient seen by palliative care who are managing patient's pain. -Patient currently on OxyContin 15 mg p.o. twice daily, oxycodone 5 to 10 mg p.o. every 4 hours as needed breakthrough pain, Cymbalta 60 mg  daily, Lyrica 75 mg daily, scheduled Tylenol 1000 mg 3 times daily, diclofenac 1.3% patch twice daily, lidocaine patch 5% 2 patches daily, Robaxin-750 milligrams 3 times daily.  K-pad as tolerated. -Fentanyl discontinued by palliative care. -Patient will need to follow-up with PCP in the outpatient setting for further  management of her pain and if need be may need a referral to the pain clinic from her PCP.   -Palliative care following and helping manage patient's pain in-house.  -Per palliative care.  3.  ATN/AKI on CKD 5>>>> ESRD -Patient noted to have a baseline creatinine around 4, serum creatinine peaked at 8 leading to initiation of HD 5/3. -TDC placed 03/12/2023 -AVF placement 03/14/2023 -Patient will need to be able to tolerate HD in recliner for outpatient HD, noted to have been able to tolerate HD on 5/20 but had significant pain after that. -It is noted that patient was informed if hemodialysis went well on Wednesday and Friday she might be close to being discharged from the hospital. -Nephrology following and appreciate input and recommendations.  4.  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 -Hemoglobin A1c 4.6 (02/14/2022) -Repeat hemoglobin A1c at 4.7 (03/21/2023) -Follow.  5.  Diarrhea/C. difficile antigen positive but toxigenic PCR negative/constipation -Patient noted to have had diarrhea workup which showed that patient was to was positive for C. difficile antigen.  Toxigenic PCR negative. -Patient noted to have received questran which resulted in constipation. -Currently on bowel regimen of Colace twice daily and MiraLAX daily which patient noted to have refused this morning.  6.  Urinary retention -Patient noted to have urinary retention early on in the hospitalization had Foley catheter placed 5/2 which was removed on 5/9 and required placement again on 5/10, no prior history of urinary retention. -Patient also noted now ESRD on HD. -Foley catheter removed 03/19/2023. -Urine output not recorded over the past 24 hours.  -Bladder scan done with 53 cc noted this morning..   -Monitor urine output.  7.  Left lower extremity wound -Continue current wound care dressings with Aquacel, wound  care RN following. Pressure Injury 02/26/23 Heel Left (Active)  02/26/23 1826  Location: Heel  Location Orientation: Left  Staging:   Wound Description (Comments):   Present on Admission: Yes   8 chronic thrombocytopenia -Secondary to cirrhosis. -Stable. -No overt bleeding.  9.  Normocytic anemia -Iron studies consistent with anemia of chronic disease. -Hemoglobin noted to have dropped down to 6.8 on 5/18, status post transfusion 1 unit PRBCs. -Hemoglobin of 6.1 on 03/23/2023 status post 2 more units PRBC transfusion with hemoglobin currently stable at 7.9 today.  -Status post IV iron in EPO support. -Transfusion threshold hemoglobin < 7. -Nephrology following.  10.  Chronic diastolic CHF -Volume management per hemodialysis. -Lasix discontinued.  -On HD.    11.  Hypertension -BP soft.  Patient was on Lasix which has subsequently been discontinued.   12.  Depression/anxiety -Cymbalta, diazepam.    13.  Chronic lower extremity DVTs -Eliquis.   14.  Fall in the OR -Patient noted to have had a fall in the OR while being transferred from the OR table to the stretcher. -CT chest abdomen and pelvis with no acute trauma noted. -CT head negative. -Plain films of the left shoulder negative for any fracture.  15.  Fever -Patient noted to have a temp of 103 (03/23/2023 ) post hemodialysis. -Chest x-ray obtained with no acute infiltrate. -  Blood cultures ordered and pending with no growth to date x 2 days.. -Patient afebrile over the past 24 hours. -Continue IV daptomycin, scheduled Tylenol.  16.  Chest pain/back pain/shortness of breath -Patient noted on 03/23/2023 with complaints of back pain and some chest pain. -Patient noted to be treated for acute osteomyelitis, discitis of the T-spine T4, T5 patient with ongoing chronic pain. -EKG done 03/23/2023 with some T wave inversions in lead I and aVL new from prior EKG. -Cardiac enzymes flattened.  Chest x-ray no acute infiltrate.   2D echo with a EF of 60 to 65%, no wall motion abnormalities, mild concentric LVH, grade 1 diastolic dysfunction, mildly dilated left atrial size.  -Currently chest pain-free. -Continue current pain regimen.  17.  Abdominal pain -Patient with complaints of abdominal pain diffusely. -??  Constipation as patient on opioid pain medication. -Patient noted to have refused MiraLAX this morning. -Dulcolax suppository ordered. -Due to discitis, osteomyelitis of the T-spine will get a CT abdomen and pelvis for further evaluation of abdominal pain.    DVT prophylaxis: Eliquis Code Status: Full Family Communication: Updated patient.  No family at bedside. Disposition: Likely home with home health once afebrile x 48 hours, workup of abdominal pain completed, and when cleared by nephrology, hopefully in the next 1 to 2 days.   Status is: Inpatient Remains inpatient appropriate because: Severity of illness   Consultants:  ID: Dr. [name] 02/10/2023 Wound care RN IR: Dr.De Melchor Amour 02/28/2023 Nephrology: Dr. Glenna Fellows 03/01/2023 Vascular surgery: Dr. Randie Heinz 03/11/2023 Palliative care: Dr. Linna Darner 03/13/2023  Procedures:  CT head 03/14/2023 Chest x-ray 03/12/2023, Placement of tunneled left-sided hemodialysis catheter: Per IR: Dr. Fredia Sorrow 03/12/2023 Plain films of left shoulder 03/14/2023 MRI T-spine 03/29/2023 Placement of left jugular nontunneled dialysis catheter using ultrasound and fluoroscopic guidance: Per IR, Dr. Lowella Dandy 03/02/2023 Renal ultrasound 03/01/2023 Upper extremity vein mapping 03/11/2023 Fluoroscopy guided T5 core biopsy and T4-5 disc aspiration: Per Dr.Katyucia, IR, 03/02/2023 Placement of left jugular type lumen dialysis catheter: Per IR, Dr. Lowella Dandy 03/02/2023 Left brachiocephalic AV fistula placement: Per vascular surgery: Dr. Chestine Spore 03/14/2023 Ultrasound-guided access left internal jugular vein, placement of left internal jugular vein tunneled dialysis catheter: Per Dr. Chestine Spore 03/12/2023 1 unit  PRBC transfusion 03/17/2023 2 units PRBC transfusion 03/23/2023 2D echo 03/25/2023  Antimicrobials: Anti-infectives (From admission, onward)    Start     Dose/Rate Route Frequency Ordered Stop   03/14/23 0000  vancomycin (VANCOCIN) IVPB 1000 mg/200 mL premix        1,000 mg 200 mL/hr over 60 Minutes Intravenous To Surgery 03/13/23 0731 03/14/23 1452   03/07/23 0000  daptomycin (CUBICIN) IVPB        650 mg Intravenous Every 48 hours 03/06/23 1302 04/13/23 2359   02/27/23 1630  DAPTOmycin (CUBICIN) 650 mg in sodium chloride 0.9 % IVPB        8 mg/kg  82.1 kg 126 mL/hr over 30 Minutes Intravenous Every 48 hours 02/26/23 1615     02/26/23 1145  cefTRIAXone (ROCEPHIN) 2 g in sodium chloride 0.9 % 100 mL IVPB        2 g 200 mL/hr over 30 Minutes Intravenous  Once 02/26/23 1143 02/26/23 1510         Subjective: Laying in bed on the bedpan.  Just received a suppository.  Denies any chest pain or shortness of breath.  Complains of diffuse abdominal pain.  Stated had small bowel movement yesterday.    Objective: Vitals:   03/25/23 0500 03/25/23 0503 03/25/23  0753 03/25/23 0828  BP:  (!) 163/75 135/64   Pulse:  89 97   Resp:   18   Temp:  98.2 F (36.8 C) 98.2 F (36.8 C)   TempSrc:  Oral Oral   SpO2:  97% 96% 96%  Weight: 84.5 kg     Height:        Intake/Output Summary (Last 24 hours) at 03/25/2023 1425 Last data filed at 03/24/2023 2216 Gross per 24 hour  Intake 240 ml  Output --  Net 240 ml   Filed Weights   03/23/23 0821 03/24/23 0500 03/25/23 0500  Weight: 41.9 kg 84.5 kg 84.5 kg    Examination:  General exam: NAD Respiratory system: Lungs clear to auscultation bilaterally.  No wheezes, no crackles, no rhonchi.  Fair air movement.  Speaking in full sentences.   Cardiovascular system: RRR no murmurs rubs or gallops.  No JVD.  No lower extremity edema.  Gastrointestinal system: Abdomen is soft, nondistended, diffusely tender to palpation in the epigastrium right upper  quadrant left upper quadrant left lower quadrant.  Positive bowel sounds. Central nervous system: Alert and oriented. No focal neurological deficits. Extremities: Symmetric 5 x 5 power. Skin: No rashes, lesions or ulcers Psychiatry: Judgement and insight appear normal. Mood & affect appropriate.     Data Reviewed: I have personally reviewed following labs and imaging studies  CBC: Recent Labs  Lab 03/22/23 0356 03/23/23 0306 03/23/23 1635 03/24/23 0306 03/25/23 0321  WBC 4.2 2.3* 3.5* 3.4* 3.5*  NEUTROABS  --   --  3.2 2.4  --   HGB 7.1* 6.1* 8.5* 8.1* 7.9*  HCT 23.2* 20.2* 26.2* 25.2* 23.7*  MCV 93.9 93.5 88.8 90.3 88.4  PLT 107* 96* 91* 103* 120*    Basic Metabolic Panel: Recent Labs  Lab 03/22/23 0356 03/23/23 0306 03/23/23 1635 03/24/23 0306 03/25/23 0321  NA 130* 127* 129* 129* 127*  K 3.6 3.4* 3.3* 3.5 3.2*  CL 94* 91* 92* 93* 91*  CO2 27 26 23 26 26   GLUCOSE 129* 103* 131* 117* 82  BUN 20 40* 12 19 30*  CREATININE 2.63* 3.55* 1.69* 2.30* 3.14*  CALCIUM 7.9* 7.8* 8.1* 7.7* 7.7*  PHOS 3.1 3.3 1.0* 4.2 3.7    GFR: Estimated Creatinine Clearance (by C-G formula based on SCr of 3.14 mg/dL (H)) Female: 16.1 mL/min (A) Female: 20.1 mL/min (A)  Liver Function Tests: Recent Labs  Lab 03/19/23 0427 03/20/23 0248 03/22/23 0356 03/23/23 0306 03/23/23 1635 03/24/23 0306 03/25/23 0321  AST 14*  --   --   --   --   --   --   ALT 9  --   --   --   --   --   --   ALKPHOS 118  --   --   --   --   --   --   BILITOT 1.2  --   --   --   --   --   --   PROT 6.5  --   --   --   --   --   --   ALBUMIN 2.5*  2.5*   < > 2.4* 2.3* 3.0* 2.6* 2.5*   < > = values in this interval not displayed.    CBG: Recent Labs  Lab 03/20/23 2015 03/20/23 2339 03/21/23 0409 03/23/23 0748 03/23/23 1614  GLUCAP 130* 138* 116* 103* 133*     Recent Results (from the past 240 hour(s))  Culture, blood (Routine X  2) w Reflex to ID Panel     Status: None (Preliminary result)    Collection Time: 03/23/23  4:22 PM   Specimen: BLOOD  Result Value Ref Range Status   Specimen Description BLOOD RIGHT ANTECUBITAL  Final   Special Requests   Final    BOTTLES DRAWN AEROBIC AND ANAEROBIC Blood Culture adequate volume   Culture   Final    NO GROWTH 2 DAYS Performed at [facility], [address], [place], [place] [address]    Report Status PENDING  Incomplete  Culture, blood (Routine X 2) w Reflex to ID Panel     Status: None (Preliminary result)   Collection Time: 03/23/23  4:52 PM   Specimen: BLOOD RIGHT HAND  Result Value Ref Range Status   Specimen Description BLOOD RIGHT HAND  Final   Special Requests   Final    BOTTLES DRAWN AEROBIC ONLY Blood Culture adequate volume   Culture   Final    NO GROWTH 2 DAYS Performed at [facility], [address] [place], [place] [address]    Report Status PENDING  Incomplete         Radiology Studies: ECHOCARDIOGRAM COMPLETE  Result Date: 03/25/2023    ECHOCARDIOGRAM REPORT   Patient Name:   NASHAI VANDENBROEK Date of Exam: 03/25/2023 Medical Rec #:  [id number]         Height:       59.0 in Accession #:    [id number]        Weight:       186.3 lb Date of Birth:  08-08-1956         BSA:          1.790 m Patient Age:    67 years          BP:           163/75 mmHg Patient Gender: F                 HR:           97 bpm. Exam Location:  Inpatient Procedure: 2D Echo, Color Doppler and Cardiac Doppler Indications:    Chest Pain R07.9  History:        Patient has prior history of Echocardiogram examinations, most                 recent 06/21/2022. CHF, CAD and Previous Myocardial Infarction,                 ESRDon Dialysis; Risk Factors:Hypertension, Dyslipidemia,                 Diabetes and Non-Smoker.  Sonographer:    Dondra Prader RVT Referring Phys: [id number] Lightcap V Edmundo Tedesco  Sonographer Comments: Technically challenging study due to limited acoustic windows, Technically difficult study due to poor echo windows, suboptimal  parasternal window, suboptimal apical window, suboptimal subcostal window and patient is obese. Image acquisition challenging due to patient body habitus and Image acquisition challenging due to respiratory motion. No IV access available for Definity IMPRESSIONS  1. Left ventricular ejection fraction, by estimation, is 60 to 65%. The left ventricle has normal function. The left ventricle has no regional wall motion abnormalities. There is mild concentric left ventricular hypertrophy. Left ventricular diastolic parameters are consistent with Grade I diastolic dysfunction (impaired relaxation).  2. Right ventricular systolic function is normal. The right ventricular size is normal. Tricuspid regurgitation signal is inadequate for assessing PA pressure.  3. Left atrial size was mildly dilated.  4. The mitral valve is normal in structure. No evidence of mitral valve regurgitation.  5. The aortic valve is tricuspid. There is mild thickening of the aortic valve. Aortic valve regurgitation is not visualized. Aortic valve sclerosis is present, with no evidence of aortic valve stenosis. Comparison(s): No significant change from prior study. Prior images reviewed side by side. FINDINGS  Left Ventricle: Left ventricular ejection fraction, by estimation, is 60 to 65%. The left ventricle has normal function. The left ventricle has no regional wall motion abnormalities. The left ventricular internal cavity size was normal in size. There is  mild concentric left ventricular hypertrophy. Left ventricular diastolic parameters are consistent with Grade I diastolic dysfunction (impaired relaxation). Right Ventricle: The right ventricular size is normal. No increase in right ventricular wall thickness. Right ventricular systolic function is normal. Tricuspid regurgitation signal is inadequate for assessing PA pressure. Left Atrium: Left atrial size was mildly dilated. Right Atrium: Right atrial size was normal in size. Pericardium:  There is no evidence of pericardial effusion. Mitral Valve: The mitral valve is normal in structure. Mild mitral annular calcification. No evidence of mitral valve regurgitation. Tricuspid Valve: The tricuspid valve is normal in structure. Tricuspid valve regurgitation is not demonstrated. Aortic Valve: The aortic valve is tricuspid. There is mild thickening of the aortic valve. Aortic valve regurgitation is not visualized. Aortic valve sclerosis is present, with no evidence of aortic valve stenosis. Aortic valve mean gradient measures 4.0  mmHg. Aortic valve peak gradient measures 7.1 mmHg. Aortic valve area, by VTI measures 2.30 cm. Pulmonic Valve: The pulmonic valve was grossly normal. Pulmonic valve regurgitation is not visualized. Aorta: The aortic root and ascending aorta are structurally normal, with no evidence of dilitation. IAS/Shunts: No atrial level shunt detected by color flow Doppler.  LEFT VENTRICLE PLAX 2D LVIDd:         3.70 cm   Diastology LVIDs:         2.50 cm   LV e' medial:    5.55 cm/s LV PW:         1.50 cm   LV E/e' medial:  18.7 LV IVS:        1.30 cm   LV e' lateral:   8.27 cm/s LVOT diam:     2.00 cm   LV E/e' lateral: 12.6 LV SV:         52 LV SV Index:   29 LVOT Area:     3.14 cm  RIGHT VENTRICLE             IVC RV Basal diam:  3.40 cm     IVC diam: 1.80 cm RV Mid diam:    3.30 cm RV S prime:     11.50 cm/s TAPSE (M-mode): 2.3 cm LEFT ATRIUM             Index        RIGHT ATRIUM           Index LA diam:        3.70 cm 2.07 cm/m   RA Area:     11.70 cm LA Vol (A2C):   62.4 ml 34.87 ml/m  RA Volume:   27.50 ml  15.37 ml/m LA Vol (A4C):   55.2 ml 30.82 ml/m LA Biplane Vol: 62.5 ml 34.93 ml/m  AORTIC VALVE                    PULMONIC VALVE AV  Area (Vmax):    2.48 cm     PV Vmax:       0.95 m/s AV Area (Vmean):   2.38 cm     PV Peak grad:  3.6 mmHg AV Area (VTI):     2.30 cm AV Vmax:           133.00 cm/s AV Vmean:          89.100 cm/s AV VTI:            0.228 m AV Peak Grad:       7.1 mmHg AV Mean Grad:      4.0 mmHg LVOT Vmax:         105.00 cm/s LVOT Vmean:        67.400 cm/s LVOT VTI:          0.167 m LVOT/AV VTI ratio: 0.73  AORTA Ao Root diam: 2.80 cm Ao Asc diam:  2.70 cm MITRAL VALVE MV Area (PHT): 4.21 cm     SHUNTS MV Decel Time: 180 msec     Systemic VTI:  0.17 m MV E velocity: 104.00 cm/s  Systemic Diam: 2.00 cm MV A velocity: 129.00 cm/s MV E/A ratio:  0.81 Mihai Croitoru MD Electronically signed by Thurmon Fair MD Signature Date/Time: 03/25/2023/11:26:13 AM    Final    DG CHEST PORT 1 VIEW  Result Date: 03/23/2023 CLINICAL DATA:  [id number] Chest pain [id number] EXAM: PORTABLE CHEST 1 VIEW COMPARISON:  CXR 03/12/23 FINDINGS: Bilateral central venous catheters in place with unchanged positioning. No pleural effusion. No pneumothorax. No new focal airspace opacity. Unchanged cardiac and mediastinal contours. Prominent bilateral interstitial opacities are favored to represent pulmonary venous congestion. No radiographically apparent new displaced rib fracture. Chronic left clavicular and humeral head fractures. Thoracolumbar spinal fusion hardware in place. IMPRESSION: No focal airspace opacity Electronically Signed   By: Lorenza Cambridge M.D.   On: 03/23/2023 17:26        Scheduled Meds:  sodium chloride   Intravenous Once   sodium chloride   Intravenous Once   acetaminophen  1,000 mg Oral TID   acetaminophen  650 mg Oral Once   acidophilus  2 capsule Oral TID   apixaban  2.5 mg Oral BID   calcitRIOL  0.5 mcg Oral Daily   calcium carbonate  1 tablet Oral Daily   Chlorhexidine Gluconate Cloth  6 each Topical Q0600   Chlorhexidine Gluconate Cloth  6 each Topical Q0600   darbepoetin (ARANESP) injection - NON-DIALYSIS  100 mcg Subcutaneous Q Fri-1800   diclofenac  1 patch Transdermal BID   docusate sodium  100 mg Oral BID   DULoxetine  60 mg Oral Daily   feeding supplement (NEPRO CARB STEADY)  237 mL Oral TID BM   lidocaine  2 patch Transdermal Q24H   methocarbamol   750 mg Oral TID   mometasone-formoterol  2 puff Inhalation BID   multivitamin  1 tablet Oral QHS   oxyCODONE  15 mg Oral Q12H   polyethylene glycol  17 g Oral Daily   pregabalin  75 mg Oral Daily   sodium chloride flush  10-40 mL Intracatheter Q12H   sodium chloride flush  3 mL Intravenous Q12H   Continuous Infusions:  DAPTOmycin (CUBICIN) 650 mg in sodium chloride 0.9 % IVPB Stopped (03/23/23 1756)     LOS: 26 days    Time spent: 35 minutes    Ramiro Harvest, MD [facility]   To contact the attending provider between 7A-7P  or the covering provider during after hours 7P-7A, please log into the web site [email or web address] and access using universal [place] password for that web site. If you do not have the password, please call the hospital operator.  03/25/2023, 2:25 PM

## 2023-03-25 NOTE — Progress Notes (Addendum)
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 TDC placement 03/12/23, appreciate assistance - CLIP to AF MWF 1145 (arrive 1115am 1st day) - Appreciate lt BCF by Dr. Chestine Spore on 5/15 -> great thrill -s/p HD on 5/24, had intradialytic hypotension required albumin and midodrine.  Plan for next HD on 5/27. - Continue MWF schedule.  # Anemia: Continue ESA and monitor hemoglobin.  Required blood transfusion..  # CKD-MBD: Apparently the phosphorus level was low yesterday therefore received phosphorus repletion.  Phosphorus 4.2 today. Continue calcitriol for secondary hyperparathyroidism.  # HTN/volume: DC Lasix.  Continue midodrine pre-HD for intradialytic hypotension.  Manage volume with HD.  # Osteomyelitis plan on daptomycin.  Last dose on 6/14. Ok to discharge from renal perspective.  # Hyponatremia, hypervolemic: Managed with dialysis.  Recommend fluid restriction.  # Acute febrile illness: Chest x-ray negative.  On antibiotics for OM.  Blood culture negative so far.  Afebrile today.  Patient is complaining of some abdominal pain therefore I recommend imaging studies.  I will inform the primary team.  # Hypokalemia: Order a dose of KCl, managed with potassium bath.  Subjective: Seen and examined.  Patient is complaining of abdominal discomfort however denies nausea, vomiting, chest pain or shortness of breath. Objective Vital signs in last 24 hours: Vitals:   03/25/23 0500 03/25/23 0503 03/25/23 0753 03/25/23 0828  BP:  (!) 163/75 135/64   Pulse:  89 97   Resp:   18   Temp:  98.2 F (36.8 C) 98.2 F (36.8 C)   TempSrc:  Oral Oral   SpO2:  97% 96% 96%  Weight: 84.5 kg     Height:       Weight change: 42.6 kg  Intake/Output Summary (Last 24 hours) at 03/25/2023 1055 Last data filed at 03/24/2023 2216 Gross per 24 hour  Intake 240 ml  Output --  Net 240 ml        Labs: RENAL PANEL Recent Labs   Lab 03/22/23 0356 03/23/23 0306 03/23/23 1635 03/24/23 0306 03/25/23 0321  NA 130* 127* 129* 129* 127*  K 3.6 3.4* 3.3* 3.5 3.2*  CL 94* 91* 92* 93* 91*  CO2 27 26 23 26 26   GLUCOSE 129* 103* 131* 117* 82  BUN 20 40* 12 19 30*  CREATININE 2.63* 3.55* 1.69* 2.30* 3.14*  CALCIUM 7.9* 7.8* 8.1* 7.7* 7.7*  PHOS 3.1 3.3 1.0* 4.2 3.7  ALBUMIN 2.4* 2.3* 3.0* 2.6* 2.5*     Liver Function Tests: Recent Labs  Lab 03/19/23 0427 03/20/23 0248 03/23/23 1635 03/24/23 0306 03/25/23 0321  AST 14*  --   --   --   --   ALT 9  --   --   --   --   ALKPHOS 118  --   --   --   --   BILITOT 1.2  --   --   --   --   PROT 6.5  --   --   --   --   ALBUMIN 2.5*  2.5*   < > 3.0* 2.6* 2.5*   < > = values in this interval not displayed.    No results for input(s): "LIPASE", "AMYLASE" in the last 168 hours. No results for input(s): "AMMONIA" in the last 168 hours. CBC: Recent Labs    07/26/22 1258 08/09/22 1343 09/06/22 1325 09/06/22 1326 09/07/22 1433 10/10/22 1308 11/08/22 1243 11/15/22 1334 12/06/22 1326 12/06/22 1327 12/13/22 1424  01/12/23 1419 01/28/23 1539 03/02/23 0402 03/02/23 1558 03/22/23 0356 03/23/23 0306 03/23/23 1635 03/24/23 0306 03/25/23 0321  HGB 8.0*   < > 10.0*  --  10.5*   < > 9.9*   < > 8.9*  --    < > 8.3*   < > 7.6*   < > 7.1* 6.1* 8.5* 8.1* 7.9*  MCV 90.7   < > 88.7  --  86.7   < > 91.2   < > 95.5  --    < > 93.3   < > 91.5   < > 93.9 93.5 88.8 90.3 88.4  VITAMINB12  --   --   --   --  273  --   --   --   --   --   --   --   --   --   --   --   --   --   --   --   FERRITIN 394*  --   --  401*  --   --  258  --   --  209  --  277  --   --   --   --   --   --   --   --   TIBC 232*  --  258  --   --   --  256  --  269  --   --  263  --  217*  --   --   --   --   --   --   IRON 64  --  69  --   --   --  53  --  46  --   --  32  --  70  --   --   --   --   --   --   RETICCTPCT 2.2  --   --  1.4  --   --  2.1  --   --  2.1  --  2.6  --   --   --   --   --   --    --   --    < > = values in this interval not displayed.     Cardiac Enzymes: Recent Labs  Lab 03/20/23 0248  CKTOTAL 63    CBG: Recent Labs  Lab 03/20/23 2015 03/20/23 2339 03/21/23 0409 03/23/23 0748 03/23/23 1614  GLUCAP 130* 138* 116* 103* 133*     Iron Studies: No results for input(s): "IRON", "TIBC", "TRANSFERRIN", "FERRITIN" in the last 72 hours. Studies/Results: DG CHEST PORT 1 VIEW  Result Date: 03/23/2023 CLINICAL DATA:  [id number] Chest pain [id number] EXAM: PORTABLE CHEST 1 VIEW COMPARISON:  CXR 03/12/23 FINDINGS: Bilateral central venous catheters in place with unchanged positioning. No pleural effusion. No pneumothorax. No new focal airspace opacity. Unchanged cardiac and mediastinal contours. Prominent bilateral interstitial opacities are favored to represent pulmonary venous congestion. No radiographically apparent new displaced rib fracture. Chronic left clavicular and humeral head fractures. Thoracolumbar spinal fusion hardware in place. IMPRESSION: No focal airspace opacity Electronically Signed   By: Lorenza Cambridge M.D.   On: 03/23/2023 17:26    Medications: Infusions:  DAPTOmycin (CUBICIN) 650 mg in sodium chloride 0.9 % IVPB Stopped (03/23/23 1756)    Scheduled Medications:  sodium chloride   Intravenous Once   sodium chloride   Intravenous Once   acetaminophen  1,000 mg Oral TID   acetaminophen  650 mg Oral Once   acidophilus  2 capsule Oral TID   apixaban  2.5 mg Oral BID   calcitRIOL  0.5 mcg Oral Daily   calcium carbonate  1 tablet Oral Daily   Chlorhexidine Gluconate Cloth  6 each Topical Q0600   darbepoetin (ARANESP) injection - NON-DIALYSIS  100 mcg Subcutaneous Q Fri-1800   diclofenac  1 patch Transdermal BID   docusate sodium  100 mg Oral BID   DULoxetine  60 mg Oral Daily   feeding supplement (NEPRO CARB STEADY)  237 mL Oral TID BM   lidocaine  2 patch Transdermal Q24H   methocarbamol  750 mg Oral TID   mometasone-formoterol  2 puff Inhalation  BID   multivitamin  1 tablet Oral QHS   oxyCODONE  15 mg Oral Q12H   polyethylene glycol  17 g Oral Daily   pregabalin  75 mg Oral Daily   sodium chloride flush  10-40 mL Intracatheter Q12H   sodium chloride flush  3 mL Intravenous Q12H    have reviewed scheduled and prn medications.  Physical Exam: General:NAD, comfortable Heart:RRR, s1s2 nl Lungs:clear b/l, no crackle Abdomen:soft, Non-tender, non-distended Extremities:No edema Dialysis Access: LIJ TDC, L BCF + T/B    Nitasha Jewel Prasad Jisele Price 03/25/2023,10:55 AM  LOS: 26 days

## 2023-03-25 NOTE — Progress Notes (Signed)
Daily Progress Note   Patient Name: [name] [name]       Date: 03/25/2023 DOB: 1956-07-05  Age: 67 y.o. MRN#: [id number] Attending Physician: Rodolph Bong, MD Primary Care Physician: Zola Button, Grayling Congress, DO Admit Date: 02/26/2023  Reason for Consultation/Follow-up: Establishing goals of care and Pain control  Subjective: Patient laying in bed with husband at bedside. She reports being very tired.  Length of Stay: 26  Current Medications: Scheduled Meds:   sodium chloride   Intravenous Once   sodium chloride   Intravenous Once   acetaminophen  1,000 mg Oral TID   acetaminophen  650 mg Oral Once   acidophilus  2 capsule Oral TID   apixaban  2.5 mg Oral BID   calcitRIOL  0.5 mcg Oral Daily   calcium carbonate  1 tablet Oral Daily   Chlorhexidine Gluconate Cloth  6 each Topical Q0600   darbepoetin (ARANESP) injection - NON-DIALYSIS  100 mcg Subcutaneous Q Fri-1800   diclofenac  1 patch Transdermal BID   docusate sodium  100 mg Oral BID   DULoxetine  60 mg Oral Daily   feeding supplement (NEPRO CARB STEADY)  237 mL Oral TID BM   lidocaine  2 patch Transdermal Q24H   methocarbamol  750 mg Oral TID   mometasone-formoterol  2 puff Inhalation BID   multivitamin  1 tablet Oral QHS   oxyCODONE  15 mg Oral Q12H   polyethylene glycol  17 g Oral Daily   pregabalin  75 mg Oral Daily   sodium chloride flush  10-40 mL Intracatheter Q12H   sodium chloride flush  3 mL Intravenous Q12H    Continuous Infusions:  DAPTOmycin (CUBICIN) 650 mg in sodium chloride 0.9 % IVPB Stopped (03/23/23 1756)    PRN Meds: albuterol, alteplase, diazepam, heparin, hydrALAZINE, lidocaine (PF), lidocaine-prilocaine, naLOXone (NARCAN)  injection, nitroGLYCERIN, ondansetron (ZOFRAN) IV, mouth rinse,  oxyCODONE, oxyCODONE, pentafluoroprop-tetrafluoroeth  Physical Exam Vitals reviewed.  Constitutional:      Appearance: She is ill-appearing.  Pulmonary:     Effort: Pulmonary effort is normal.  Abdominal:     Palpations: Abdomen is soft.  Skin:    General: Skin is warm and dry.  Neurological:     Mental Status: She is alert.  Psychiatric:        Mood and Affect:  Mood normal.        Behavior: Behavior normal.             Vital Signs: BP 135/64 (BP Location: Right Arm)   Pulse 97   Temp 98.2 F (36.8 C) (Oral)   Resp 18   Ht 4\' 11"  (1.499 m)   Wt 84.5 kg   SpO2 96%   BMI 37.63 kg/m  SpO2: SpO2: 96 % O2 Device: O2 Device: Nasal Cannula O2 Flow Rate: O2 Flow Rate (L/min): 2 L/min  Intake/output summary:  Intake/Output Summary (Last 24 hours) at 03/25/2023 1003 Last data filed at 03/24/2023 2216 Gross per 24 hour  Intake 240 ml  Output --  Net 240 ml   LBM: Last BM Date : 03/22/23 Baseline Weight: Weight: 82.1 kg Most recent weight: Weight: 84.5 kg       Palliative Assessment/Data: 40%      Patient Active Problem List   Diagnosis Date Noted   Fever 03/23/2023   Chronic diastolic CHF (congestive heart failure) (HCC) 03/21/2023   Thrombocytopenia (HCC) 03/21/2023   ESRD on dialysis (HCC) 03/21/2023   Acute osteomyelitis of thoracic spine (HCC) 02/27/2023   Vertebral osteomyelitis (HCC) 01/28/2023   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   Osteomyelitis (HCC) 06/20/2022   Estrogen deficiency 05/25/2022   CKD (chronic kidney disease) stage 5, GFR less than 15 ml/min (HCC) 02/14/2022   Prolonged QT interval 01/07/2022   Streptococcal bacteremia    Osteomyelitis of left foot (HCC) 10/16/2021   Diabetic foot ulcers (HCC) 10/16/2021   Portal hypertension (HCC) 08/16/2021   Eczema of scalp 06/27/2021   Hypothyroidism 06/27/2021   Pruritus 06/27/2021   Seasonal allergic rhinitis due to pollen 02/02/2021   PTSD (post-traumatic stress disorder)    Osteoarthritis     MRSA (methicillin resistant Staphylococcus aureus)    Hypertension    High cholesterol    Hepatic steatosis    GERD (gastroesophageal reflux disease)    Fibromyalgia    Episode of visual loss of left eye    Depression    Constipation    Cataract    Bulging lumbar disc    Asthma    Anxiety    Anemia    Tinea capitis 10/11/2020   Tinea corporis 10/11/2020   Hip pain 05/16/2020   IDA (iron deficiency anemia) 05/04/2020   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) 03/16/2020   S/P lumbar fusion 03/04/2020   Spinal stenosis of lumbar region 01/22/2020   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) 01/21/2020   Compression fracture of L1 lumbar vertebra (HCC) 11/17/2019   Fracture of multiple ribs 11/17/2019   Nausea & vomiting 11/15/2019   Diarrhea 09/04/2019   Pressure injury of skin 04/17/2019   Septic arthritis of elbow, right (HCC) 04/17/2019   Retinopathy 04/17/2019   Renal transplant, 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   Sleep apnea 11/16/2017   Diabetic foot infection (HCC) 04/06/2017   Type 2 diabetes mellitus with hyperglycemia, with long-term current use of insulin (HCC) 04/06/2017   Neuropathy 05/05/2015   Diabetic peripheral neuropathy (HCC) 01/12/2015   CKD (chronic kidney disease), stage III (HCC) 05/27/2014   History of MI (myocardial infarction) 10/06/2013   Chest pain 10/06/2013   Nausea and vomiting 10/06/2013   Obesity (BMI 30-39.9) 09/20/2013   Multinodular goiter 04/17/2013   Normocytic anemia 02/03/2013   CAD (coronary artery disease) 01/11/2012   UTI (urinary tract infection) 08/05/2010   Hepatic cirrhosis (HCC) 07/06/2010   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   Hyperlipidemia  06/03/2008   Essential hypertension 12/23/2006   Acute MI [facility]) 2007    Palliative Care Assessment & Plan   Patient Profile: Louvenia Doss is a 67 y.o. adult with medical history significant of CKD 5,diabetes, neuropathy, thrombocytopenia, OSA,  osteomyelitis, diabetic foot infections, anemia, hypothyroidism, hypertension, hyperlipidemia, CAD, cirrhosis, asthma, spinal stenosis, status post lumbar fusion, PTSD, depression, anxiety, obesity, fibromyalgia, DVT, cataracts presenting with worsening back pain, nausea, vomiting. She presented to the hospital for back pain, abdominal pain, nausea, and vomiting and was admitted on 02/26/23.   Assessment: Patient had a fever Friday after dialysis. She reports being very weak which she notices when she tries to get out of the bed. She does state that her pain is better than last week. She does feel relief from her prn medication and understands her pain score may never be a 0/10.  She says she is very tired. We discussed everything that has happened during this hospitalization. We also discussed the big picture of her chronic medical conditions. I encouraged the patient and her husband [name] to continue talking about her GOC.   Recommendations/Plan: Full code / Full scope of care   Symptoms:  Chronic Pain Continue tylenol 1000 mg tid Continue diclofenac (Flector) 1.3% patch bid Continue lidocaine patch 5% 2 patches daily Continue Robaxin 750mg  tid Oxycontin 15mg  PO BID Oxycodone 5-10mg  PO Q4H PRN moderate/severe pain  Continue duloxetine (Cymbalta) 60mg  daily Continue pregabalin (Lyrica) 75mg  daily Kpad to back as tolerated  Goals of Care and Additional Recommendations: Limitations on Scope of Treatment: Full Scope Treatment  Code Status:    Code Status Orders  (From admission, onward)           Start     Ordered   02/26/23 1448  Full code  Continuous       Question:  By:  Answer:  Consent: discussion documented in EHR   02/26/23 1449           Code Status History     Date Active Date Inactive Code Status Order ID Comments User Context   01/28/2023 2336 02/04/2023 1730 Full Code [id number]  John Giovanni, MD ED   06/21/2022 0025 07/11/2022 2159 Full Code [id number]   Carollee Herter, DO ED   01/07/2022 0511 01/09/2022 2045 Full Code [id number]  Howerter, Chaney Born, DO Inpatient   10/17/2021 0420 10/22/2021 1928 Full Code [id number]  Chotiner, Claudean Severance, MD ED   02/18/2020 2241 02/26/2020 2334 Full Code [id number]  Lewie Chamber, MD Inpatient   01/29/2020 1704 02/18/2020 2044 Full Code [id number]  Charlton Amor, PA-C Inpatient   01/29/2020 1704 01/29/2020 1704 Full Code [id number]  Charlton Amor, PA-C Inpatient   01/22/2020 2141 01/29/2020 1650 Full Code [id number]  Bedelia Person, MD Inpatient   01/21/2020 0012 01/22/2020 2141 Full Code [id number]  Eduard Clos, MD Inpatient   11/15/2019 2200 11/18/2019 2226 Full Code [id number]  Pearson Grippe, MD ED   04/15/2019 2122 04/21/2019 2320 Full Code [id number]  Rometta Emery, MD Inpatient   04/06/2017 2004 04/11/2017 2109 Full Code [id number]  Alberteen Sam, MD Inpatient   09/29/2016 1831 10/02/2016 1643 Full Code [id number]  Nadara Mustard, MD Inpatient   03/19/2013 1507 03/21/2013 1812 Full Code [id number]  Nadara Mustard, MD Inpatient   02/10/2013 2124 02/20/2013 1859 Full Code [id number]  Nadara Mustard, MD Inpatient   02/03/2013 2137 02/10/2013 2124 Full Code [id number]  Cristal Ford, MD Inpatient   01/11/2012 0612 01/13/2012 1645 Full  Code [id number]  Wilder Glade, RN Inpatient       Prognosis:  Unable to determine  Discharge Planning: To Be Determined   Thank you for allowing the Palliative Medicine Team to assist in the care of this patient.  Time spent: 35 min   Detailed review of medical records ( labs, imaging, vital signs), medically appropriate exam, counseling and education to patient and her husband, documenting clinical information, medication management, coordination of care.   Sherryll Burger, NP  Please contact Palliative Medicine Team phone at [phone] for questions and concerns.

## 2023-03-26 DIAGNOSIS — E785 Hyperlipidemia, unspecified: Secondary | ICD-10-CM

## 2023-03-26 DIAGNOSIS — M4624 Osteomyelitis of vertebra, thoracic region: Secondary | ICD-10-CM | POA: Diagnosis not present

## 2023-03-26 DIAGNOSIS — I5032 Chronic diastolic (congestive) heart failure: Secondary | ICD-10-CM | POA: Diagnosis not present

## 2023-03-26 DIAGNOSIS — N185 Chronic kidney disease, stage 5: Secondary | ICD-10-CM | POA: Diagnosis not present

## 2023-03-26 DIAGNOSIS — F419 Anxiety disorder, unspecified: Secondary | ICD-10-CM | POA: Diagnosis not present

## 2023-03-26 DIAGNOSIS — K5900 Constipation, unspecified: Secondary | ICD-10-CM

## 2023-03-26 LAB — CBC WITH DIFFERENTIAL/PLATELET
Abs Immature Granulocytes: 0.02 10*3/uL (ref 0.00–0.07)
Basophils Absolute: 0 10*3/uL (ref 0.0–0.1)
Basophils Relative: 0 %
Eosinophils Absolute: 0.2 10*3/uL (ref 0.0–0.5)
Eosinophils Relative: 5 %
HCT: 23.7 % — ABNORMAL LOW (ref 36.0–46.0)
Hemoglobin: 7.7 g/dL — ABNORMAL LOW (ref 12.0–15.0)
Immature Granulocytes: 1 %
Lymphocytes Relative: 14 %
Lymphs Abs: 0.5 10*3/uL — ABNORMAL LOW (ref 0.7–4.0)
MCH: 28.9 pg (ref 26.0–34.0)
MCHC: 32.5 g/dL (ref 30.0–36.0)
MCV: 89.1 fL (ref 80.0–100.0)
Monocytes Absolute: 0.3 10*3/uL (ref 0.1–1.0)
Monocytes Relative: 7 %
Neutro Abs: 2.7 10*3/uL (ref 1.7–7.7)
Neutrophils Relative %: 73 %
Platelets: 102 10*3/uL — ABNORMAL LOW (ref 150–400)
RBC: 2.66 MIL/uL — ABNORMAL LOW (ref 3.87–5.11)
RDW: 17.3 % — ABNORMAL HIGH (ref 11.5–15.5)
WBC: 3.7 10*3/uL — ABNORMAL LOW (ref 4.0–10.5)
nRBC: 0 % (ref 0.0–0.2)

## 2023-03-26 LAB — RENAL FUNCTION PANEL
Albumin: 2.5 g/dL — ABNORMAL LOW (ref 3.5–5.0)
Anion gap: 12 (ref 5–15)
BUN: 43 mg/dL — ABNORMAL HIGH (ref 8–23)
CO2: 23 mmol/L (ref 22–32)
Calcium: 7.6 mg/dL — ABNORMAL LOW (ref 8.9–10.3)
Chloride: 92 mmol/L — ABNORMAL LOW (ref 98–111)
Creatinine, Ser: 3.96 mg/dL — ABNORMAL HIGH (ref 0.44–1.00)
GFR, Estimated: 12 mL/min — ABNORMAL LOW (ref >60)
Glucose, Bld: 75 mg/dL (ref 70–99)
Phosphorus: 4.4 mg/dL (ref 2.5–4.6)
Potassium: 3.8 mmol/L (ref 3.5–5.1)
Sodium: 127 mmol/L — ABNORMAL LOW (ref 135–145)

## 2023-03-26 LAB — UREA NITROGEN (UUN), 24 HR URINE
Total Volume: 100
Urea Nitrogen, 24H Ur: 0 g/24 hr — ABNORMAL LOW (ref 4–16)
Urea Nitrogen, Ur: 178 mg/dL

## 2023-03-26 LAB — CBC
HCT: 26.2 % — ABNORMAL LOW (ref 36.0–46.0)
Hemoglobin: 8.4 g/dL — ABNORMAL LOW (ref 12.0–15.0)
MCH: 29.3 pg (ref 26.0–34.0)
MCHC: 32.1 g/dL (ref 30.0–36.0)
MCV: 91.3 fL (ref 80.0–100.0)
Platelets: 104 10*3/uL — ABNORMAL LOW (ref 150–400)
RBC: 2.87 MIL/uL — ABNORMAL LOW (ref 3.87–5.11)
RDW: 17.5 % — ABNORMAL HIGH (ref 11.5–15.5)
WBC: 4.8 10*3/uL (ref 4.0–10.5)
nRBC: 0 % (ref 0.0–0.2)

## 2023-03-26 LAB — CULTURE, BLOOD (ROUTINE X 2): Special Requests: ADEQUATE

## 2023-03-26 MED ORDER — HEPARIN SODIUM (PORCINE) 1000 UNIT/ML DIALYSIS
1000.0000 [IU] | INTRAMUSCULAR | Status: DC | PRN
  Administered 2023-03-26: 1000 [IU]
  Filled 2023-03-26 (×2): qty 1

## 2023-03-26 MED ORDER — ANTICOAGULANT SODIUM CITRATE 4% (200MG/5ML) IV SOLN
5.0000 mL | Status: DC | PRN
  Filled 2023-03-26: qty 5

## 2023-03-26 MED ORDER — PENTAFLUOROPROP-TETRAFLUOROETH EX AERO: 1.0000 | INHALATION_SPRAY | CUTANEOUS | Status: DC | PRN

## 2023-03-26 MED ORDER — POLYETHYLENE GLYCOL 3350 17 G PO PACK
17.0000 g | PACK | Freq: Two times a day (BID) | ORAL | Status: DC
  Administered 2023-03-27 – 2023-04-03 (×7): 17 g via ORAL
  Filled 2023-03-26 (×14): qty 1

## 2023-03-26 MED ORDER — ALBUMIN HUMAN 25 % IV SOLN
INTRAVENOUS | Status: AC
  Filled 2023-03-26: qty 100

## 2023-03-26 MED ORDER — ALBUMIN HUMAN 25 % IV SOLN
25.0000 g | Freq: Once | INTRAVENOUS | Status: AC
  Administered 2023-03-26: 25 g via INTRAVENOUS

## 2023-03-26 MED ORDER — SENNOSIDES-DOCUSATE SODIUM 8.6-50 MG PO TABS
1.0000 | ORAL_TABLET | Freq: Two times a day (BID) | ORAL | Status: DC
  Administered 2023-03-26 – 2023-04-01 (×9): 1 via ORAL
  Filled 2023-03-26 (×14): qty 1

## 2023-03-26 MED ORDER — ALTEPLASE 2 MG IJ SOLR: 2.0000 mg | Freq: Once | INTRAMUSCULAR | Status: DC | PRN

## 2023-03-26 MED ORDER — LIDOCAINE HCL (PF) 1 % IJ SOLN
5.0000 mL | INTRAMUSCULAR | Status: DC | PRN
  Filled 2023-03-26: qty 5

## 2023-03-26 MED ORDER — MIDODRINE HCL 5 MG PO TABS
10.0000 mg | ORAL_TABLET | Freq: Three times a day (TID) | ORAL | Status: DC
  Administered 2023-03-26 – 2023-03-28 (×6): 10 mg via ORAL
  Filled 2023-03-26 (×6): qty 2

## 2023-03-26 MED ORDER — HEPARIN SODIUM (PORCINE) 1000 UNIT/ML IJ SOLN
INTRAMUSCULAR | Status: AC
  Filled 2023-03-26: qty 4

## 2023-03-26 MED ORDER — SORBITOL 70 % SOLN
30.0000 mL | Status: AC
  Administered 2023-03-26: 30 mL via ORAL
  Filled 2023-03-26 (×2): qty 30

## 2023-03-26 MED ORDER — HEPARIN SODIUM (PORCINE) 1000 UNIT/ML DIALYSIS
20.0000 [IU]/kg | INTRAMUSCULAR | Status: DC | PRN
  Filled 2023-03-26 (×2): qty 2

## 2023-03-26 MED ORDER — LIDOCAINE-PRILOCAINE 2.5-2.5 % EX CREA
1.0000 | TOPICAL_CREAM | CUTANEOUS | Status: DC | PRN
  Filled 2023-03-26: qty 5

## 2023-03-26 NOTE — Progress Notes (Signed)
Received patient in bed to unit.  Alert and oriented.  Informed consent signed and in chart.   TX duration:4 h  Patient tolerated well.  Transported back to the room  Alert, without acute distress.  Hand-off given to patient's nurse.   Access used: catheter Access issues: none   Total UF removed: 3 L Medication(s) given: Midodrine 10 mg po. Albumin 25 g IV Post HD VS: 116/70 p 74 R 16 O2 sat 100 %. Post HD weight: 87 kg   [facility]

## 2023-03-26 NOTE — Progress Notes (Signed)
PT Cancellation Note  Patient Details Name: [name] [name] MRN: [id number] DOB: [birth year]   Cancelled Treatment:    Reason Eval/Treat Not Completed: (P) Patient declined, no reason specified (Pt reports recently getting back from HD and that she is very tired. Pt requests that PT come back tomorrow.)   Johny Shock 03/26/2023, 3:54 PM

## 2023-03-26 NOTE — Progress Notes (Signed)
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  BMI baseline.  Presented to the hospital with complaints of back pain.  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  4/29 admitted to the hospital.  MRI controllable Looking for vertebral body, new marrow edema T3.  7 mm epidural fluid collection. 5/2 nephrology was consulted for AKI on CKD 5. 5/3 underwent IR guided T5 bone biopsy and T4-T5 disc aspiration.  IR also placed temp HD catheter for HD. 5/12 vascular surgery was consulted. 5/13 left TDC placement with vascular surgery. 5/14 palliative care consulted for pain management 5/15 underwent aVF placement left brachial cephalic. Had a Fall in the OR while being transferred from the OR table. 5/17 Accepted at [facility] SW GBO TTS pending pt's ability to sit for extended period of time. 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 -Imaging of MRI T-spine done 02/27/2023 with persistent T4-T5 discitis/osteomyelitis, in comparison to January 28, 2023, mild interval collapse in the T4 vertebral body.  Mild new marrow edema at T3 is new and likely represents osteomyelitis.  Mild interval increase in size of suspected dorsal epidural fluid collection measuring 7 mm, possibly abscess or phlegmon. -Blood cultures obtained 02/26/2023 were negative. -IR consulted patient underwent biopsy with no growth to date from aspiration. -Patient seen in consultation by ID who recommended IV daptomycin through 04/13/2023 with outpatient follow-up scheduled 04/12/2023.  2.  Pain control/chronic anxiety -Patient assessed by palliative care, prior to admission patient noted to be on Norco 5/325 4 times daily as needed also noted to have been on Valium before in the past.  Patient also noted on Lyrica 75 mg 3 times daily. -Patient seen by palliative care who are managing patient's pain. -Patient currently on OxyContin 15 mg p.o. twice daily, oxycodone 5 to 10 mg p.o. every 4 hours as needed breakthrough pain, Cymbalta 60 mg  daily, Lyrica 75 mg daily, scheduled Tylenol 1000 mg 3 times daily, diclofenac 1.3% patch twice daily, lidocaine patch 5% 2 patches daily, Robaxin-750 milligrams 3 times daily.  K-pad as tolerated. -Fentanyl discontinued by palliative care. -Patient will need to follow-up with PCP in the outpatient setting for further  management of her pain and if need be may need a referral to the pain clinic from her PCP.   -Palliative care following and helping manage patient's pain in-house.  -Per palliative care.  3.  ATN/AKI on CKD 5>>>> ESRD -Patient noted to have a baseline creatinine around 4, serum creatinine peaked at 8 leading to initiation of HD 5/3. -TDC placed 03/12/2023 -AVF placement 03/14/2023 -Patient will need to be able to tolerate HD in recliner for outpatient HD, noted to have been able to tolerate HD on 5/20 but had significant pain after that. -Nephrology following and appreciate input and recommendations.  4.  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 -Hemoglobin A1c 4.6 (02/14/2022) -Repeat hemoglobin A1c at 4.7 (03/21/2023) -Follow.  5.  Diarrhea/C. difficile antigen positive but toxigenic PCR negative/constipation -Patient noted to have had diarrhea workup which showed that patient was to was positive for C. difficile antigen.  Toxigenic PCR negative. -Patient noted to have received questran which resulted in constipation. -Currently on bowel regimen of Colace twice daily and MiraLAX daily which patient noted to have refused. -Increase MiraLAX to twice daily, discontinue Colace and placed on Senokot-S twice daily.  Sorbitol p.o.  6.  Urinary retention -Patient noted to have urinary retention early on in the hospitalization had Foley catheter placed 5/2 which was removed on 5/9 and required placement again on 5/10, no prior history of urinary retention. -Patient also noted now ESRD on HD. -Foley catheter removed 03/19/2023. -Urine output not recorded over the past 24 hours.  -Bladder scan done with 193 cc noted this morning..   -Monitor urine output.  7.  Left lower extremity wound -Continue current wound care dressings with Aquacel, wound care RN following. Pressure Injury 02/26/23 Heel Left  (Active)  02/26/23 1826  Location: Heel  Location Orientation: Left  Staging:   Wound Description (Comments):   Present on Admission: Yes   8 chronic thrombocytopenia -Secondary to cirrhosis. -Stable. -No overt bleeding.  9.  Normocytic anemia -Iron studies consistent with anemia of chronic disease. -Hemoglobin noted to have dropped down to 6.8 on 5/18, status post transfusion 1 unit PRBCs. -Hemoglobin of 6.1 on 03/23/2023 status post 2 more units PRBC transfusion with hemoglobin currently stable at 8.4 today.  -Status post IV iron and EPO support. -Transfusion threshold hemoglobin < 7. -Nephrology following.  10.  Chronic diastolic CHF -Volume management per hemodialysis. -Lasix discontinued.  -On HD.    11.  Hypertension -BP soft.  Patient was on Lasix which has subsequently been discontinued.  -On midodrine with hemodialysis.  12.  Depression/anxiety -Continue Cymbalta, diazepam.    13.  Chronic lower extremity DVTs -Continue Eliquis.    14.  Fall in the OR -Patient noted to have had a fall in the OR while being transferred from the OR table to the stretcher. -CT chest abdomen and pelvis with no acute trauma noted. -CT head negative. -Plain films of the left shoulder negative for any fracture.  15.  Fever -Patient noted to have a temp of 103 (03/23/2023 ) post hemodialysis. -Chest x-ray obtained with no acute infiltrate. -Blood cultures ordered and  pending with no growth to date x 3 days.. -Patient afebrile over the past 24 hours. -Continue IV daptomycin, scheduled Tylenol.  16.  Chest pain/back pain/shortness of breath -Patient noted on 03/23/2023 with complaints of back pain and some chest pain. -Patient noted to be treated for acute osteomyelitis, discitis of the T-spine T4, T5 patient with ongoing chronic pain. -EKG done 03/23/2023 with some T wave inversions in lead I and aVL new from prior EKG. -Cardiac enzymes flattened.  Chest x-ray no acute infiltrate.  2D  echo with a EF of 60 to 65%, no wall motion abnormalities, mild concentric LVH, grade 1 diastolic dysfunction, mildly dilated left atrial size.  -Currently chest pain-free. -Continue current pain regimen.  17.  Abdominal pain secondary to constipation -Patient with complaints of abdominal pain diffusely on 03/25/2023 -??  Constipation as patient on opioid pain medication. -Patient noted to have refused MiraLAX on 03/25/2023.  -Dulcolax suppository ordered with no results.. -Due to discitis, osteomyelitis of the T-spine, CT abdomen and pelvis obtained with no significant acute abnormalities however consistent with constipation.  -Increase MiraLAX to twice daily.  Discontinue Colace and placed on Senokot-S twice daily.  -Sorbitol p.o. every 3 hours x 2 doses.  -If no results with bowel regimen will need a enema.     DVT prophylaxis: Eliquis Code Status: Full Family Communication: Updated patient.  No family at bedside. Disposition: Likely home with home health once afebrile x 48 hours, workup of abdominal pain completed, and when cleared by nephrology, hopefully tomorrow.  Status is: Inpatient Remains inpatient appropriate because: Severity of illness   Consultants:  ID: Dr. [name] 02/10/2023 Wound care RN IR: Dr.De Melchor Amour 02/28/2023 Nephrology: Dr. Glenna Fellows 03/01/2023 Vascular surgery: Dr. Randie Heinz 03/11/2023 Palliative care: Dr. Linna Darner 03/13/2023  Procedures:  CT head 03/14/2023 Chest x-ray 03/12/2023, Placement of tunneled left-sided hemodialysis catheter: Per IR: Dr. Fredia Sorrow 03/12/2023 Plain films of left shoulder 03/14/2023 MRI T-spine 03/29/2023 Placement of left jugular nontunneled dialysis catheter using ultrasound and fluoroscopic guidance: Per IR, Dr. Lowella Dandy 03/02/2023 Renal ultrasound 03/01/2023 Upper extremity vein mapping 03/11/2023 Fluoroscopy guided T5 core biopsy and T4-5 disc aspiration: Per Dr.Katyucia, IR, 03/02/2023 Placement of left jugular type lumen dialysis catheter: Per  IR, Dr. Lowella Dandy 03/02/2023 Left brachiocephalic AV fistula placement: Per vascular surgery: Dr. Chestine Spore 03/14/2023 Ultrasound-guided access left internal jugular vein, placement of left internal jugular vein tunneled dialysis catheter: Per Dr. Chestine Spore 03/12/2023 1 unit PRBC transfusion 03/17/2023 2 units PRBC transfusion 03/23/2023 2D echo 03/25/2023 CT abdomen and pelvis 03/25/2023  Antimicrobials: Anti-infectives (From admission, onward)    Start     Dose/Rate Route Frequency Ordered Stop   03/14/23 0000  vancomycin (VANCOCIN) IVPB 1000 mg/200 mL premix        1,000 mg 200 mL/hr over 60 Minutes Intravenous To Surgery 03/13/23 0731 03/14/23 1452   03/07/23 0000  daptomycin (CUBICIN) IVPB        650 mg Intravenous Every 48 hours 03/06/23 1302 04/13/23 2359   02/27/23 1630  DAPTOmycin (CUBICIN) 650 mg in sodium chloride 0.9 % IVPB        8 mg/kg  82.1 kg 126 mL/hr over 30 Minutes Intravenous Every 48 hours 02/26/23 1615     02/26/23 1145  cefTRIAXone (ROCEPHIN) 2 g in sodium chloride 0.9 % 100 mL IVPB        2 g 200 mL/hr over 30 Minutes Intravenous  Once 02/26/23 1143 02/26/23 1510         Subjective: In hemodialysis.  Sleeping.  Denies any chest pain or shortness of breath.  Still with some abdominal discomfort/pain.  Stated was given a suppository yesterday but with no bowel movement.  States took some oral laxatives this morning.   Objective: Vitals:   03/26/23 0926 03/26/23 1000 03/26/23 1030 03/26/23 1100  BP: (!) 109/49 (!) 96/53 96/75 108/68  Pulse: 70 69 70 69  Resp: 10 10 10 12   Temp:      TempSrc:      SpO2: 99% 99% 99% 99%  Weight:      Height:        Intake/Output Summary (Last 24 hours) at 03/26/2023 1122 Last data filed at 03/26/2023 0700 Gross per 24 hour  Intake 670.18 ml  Output --  Net 670.18 ml    Filed Weights   03/25/23 0500 03/26/23 0433 03/26/23 0923  Weight: 84.5 kg 83.2 kg 90 kg    Examination:  General exam: NAD. Respiratory system: CTAB anterior  lung fields.  No wheezes, no crackles, no rhonchi.  Fair air movement.  Speaking in full sentences.  Cardiovascular system: Regular rate rhythm no murmurs rubs or gallops.  No JVD.  No lower extremity edema.  Gastrointestinal system: Abdomen is soft, nondistended, decreased diffuse tenderness to palpation.  Positive bowel sounds.  No rebound.  No guarding.  Central nervous system: Alert and oriented. No focal neurological deficits. Extremities: Symmetric 5 x 5 power. Skin: No rashes, lesions or ulcers Psychiatry: Judgement and insight appear normal. Mood & affect appropriate.     Data Reviewed: I have personally reviewed following labs and imaging studies  CBC: Recent Labs  Lab 03/23/23 0306 03/23/23 1635 03/24/23 0306 03/25/23 0321 03/26/23 0950  WBC 2.3* 3.5* 3.4* 3.5* 3.7*  NEUTROABS  --  3.2 2.4  --  2.7  HGB 6.1* 8.5* 8.1* 7.9* 7.7*  HCT 20.2* 26.2* 25.2* 23.7* 23.7*  MCV 93.5 88.8 90.3 88.4 89.1  PLT 96* 91* 103* 120* 102*     Basic Metabolic Panel: Recent Labs  Lab 03/23/23 0306 03/23/23 1635 03/24/23 0306 03/25/23 0321 03/26/23 0502  NA 127* 129* 129* 127* 127*  K 3.4* 3.3* 3.5 3.2* 3.8  CL 91* 92* 93* 91* 92*  CO2 26 23 26 26 23   GLUCOSE 103* 131* 117* 82 75  BUN 40* 12 19 30* 43*  CREATININE 3.55* 1.69* 2.30* 3.14* 3.96*  CALCIUM 7.8* 8.1* 7.7* 7.7* 7.6*  PHOS 3.3 1.0* 4.2 3.7 4.4     GFR: Estimated Creatinine Clearance (by C-G formula based on SCr of 3.96 mg/dL (H)) Female: 16.1 mL/min (A) Female: 16.5 mL/min (A)  Liver Function Tests: Recent Labs  Lab 03/23/23 0306 03/23/23 1635 03/24/23 0306 03/25/23 0321 03/26/23 0502  ALBUMIN 2.3* 3.0* 2.6* 2.5* 2.5*     CBG: Recent Labs  Lab 03/20/23 2015 03/20/23 2339 03/21/23 0409 03/23/23 0748 03/23/23 1614  GLUCAP 130* 138* 116* 103* 133*      Recent Results (from the past 240 hour(s))  Culture, blood (Routine X 2) w Reflex to ID Panel     Status: None (Preliminary result)   Collection  Time: 03/23/23  4:22 PM   Specimen: BLOOD  Result Value Ref Range Status   Specimen Description BLOOD RIGHT ANTECUBITAL  Final   Special Requests   Final    BOTTLES DRAWN AEROBIC AND ANAEROBIC Blood Culture adequate volume   Culture   Final    NO GROWTH 3 DAYS Performed at [facility], [address], [place], [place] [address]  Report Status PENDING  Incomplete  Culture, blood (Routine X 2) w Reflex to ID Panel     Status: None (Preliminary result)   Collection Time: 03/23/23  4:52 PM   Specimen: BLOOD RIGHT HAND  Result Value Ref Range Status   Specimen Description BLOOD RIGHT HAND  Final   Special Requests   Final    BOTTLES DRAWN AEROBIC ONLY Blood Culture adequate volume   Culture   Final    NO GROWTH 3 DAYS Performed at [facility], [address], [place], [place] [address]    Report Status PENDING  Incomplete         Radiology Studies: CT ABDOMEN PELVIS WO CONTRAST  Result Date: 03/25/2023 CLINICAL DATA:  Acute abdominal pain EXAM: CT ABDOMEN AND PELVIS WITHOUT CONTRAST TECHNIQUE: Multidetector CT imaging of the abdomen and pelvis was performed following the standard protocol without IV contrast. RADIATION DOSE REDUCTION: This exam was performed according to the departmental dose-optimization program which includes automated exposure control, adjustment of the mA and/or kV according to patient size and/or use of iterative reconstruction technique. COMPARISON:  CT 03/14/2023 FINDINGS: Lower chest: Mild dependent atelectasis in the left lobe, slightly improved from prior CT. Coronary artery calcifications. Hepatobiliary: Hepatic cirrhotic morphology with calcified granuloma, unchanged. No evidence of focal liver abnormality on this unenhanced exam. Cholecystectomy. Stable biliary tree. Pancreas: No ductal dilatation or inflammation. Spleen: Stable splenomegaly. Splenic granuloma again seen. Adrenals/Urinary Tract: No adrenal nodule. No hydronephrosis.  Calcifications at the right renal hilum may be nonobstructing stones or vascular. Both ureters are decompressed. Moderate bladder distention without wall thickening. Stomach/Bowel: No bowel obstruction or inflammation. There is a small duodenal diverticulum. Moderate volume of colonic stool, slightly decreased stool burden from prior. Administered enteric contrast reaches the colon. The appendix is normal. Vascular/Lymphatic: Aortic atherosclerosis. Few prominent in periportal nodes are likely reactive. Reproductive: Status post hysterectomy. No adnexal masses. Other: No ascites. There is mild generalized body wall edema. Tiny fat containing umbilical hernia Musculoskeletal: Posterior thoracolumbar fusion hardware L1 corpectomy. Intact hardware. Irregularity at the T10-T11 disc space and adjacent sclerosis was present on prior exam, site of previous osteomyelitis discitis. IMPRESSION: 1. No acute findings. 2. Moderate colonic stool burden, slightly diminished from prior exam. Query constipation. 3. Cirrhosis and splenomegaly. 4. Moderate bladder distention without wall thickening. 5. Possible nonobstructing stones or vascular calcifications in the right renal hilum. Aortic Atherosclerosis (ICD10-I70.0). Electronically Signed   By: Narda Rutherford M.D.   On: 03/25/2023 22:26   ECHOCARDIOGRAM COMPLETE  Result Date: 03/25/2023    ECHOCARDIOGRAM REPORT   Patient Name:   [name] [name] Date of Exam: 03/25/2023 Medical Rec #:  [id number]         Height:       59.0 in Accession #:    [id number]        Weight:       186.3 lb Date of Birth:  [birth year]         BSA:          1.790 m Patient Age:    67 years          BP:           163/75 mmHg Patient Gender: F                 HR:           97 bpm. Exam Location:  Inpatient Procedure: 2D Echo, Color Doppler and Cardiac Doppler Indications:    Chest  Pain R07.9  History:        Patient has prior history of Echocardiogram examinations, most                 recent 06/21/2022.  CHF, CAD and Previous Myocardial Infarction,                 ESRDon Dialysis; Risk Factors:Hypertension, Dyslipidemia,                 Diabetes and Non-Smoker.  Sonographer:    Dondra Prader RVT Referring Phys: [id number] Alyzae Hawkey V Ziasia Lenoir  Sonographer Comments: Technically challenging study due to limited acoustic windows, Technically difficult study due to poor echo windows, suboptimal parasternal window, suboptimal apical window, suboptimal subcostal window and patient is obese. Image acquisition challenging due to patient body habitus and Image acquisition challenging due to respiratory motion. No IV access available for Definity IMPRESSIONS  1. Left ventricular ejection fraction, by estimation, is 60 to 65%. The left ventricle has normal function. The left ventricle has no regional wall motion abnormalities. There is mild concentric left ventricular hypertrophy. Left ventricular diastolic parameters are consistent with Grade I diastolic dysfunction (impaired relaxation).  2. Right ventricular systolic function is normal. The right ventricular size is normal. Tricuspid regurgitation signal is inadequate for assessing PA pressure.  3. Left atrial size was mildly dilated.  4. The mitral valve is normal in structure. No evidence of mitral valve regurgitation.  5. The aortic valve is tricuspid. There is mild thickening of the aortic valve. Aortic valve regurgitation is not visualized. Aortic valve sclerosis is present, with no evidence of aortic valve stenosis. Comparison(s): No significant change from prior study. Prior images reviewed side by side. FINDINGS  Left Ventricle: Left ventricular ejection fraction, by estimation, is 60 to 65%. The left ventricle has normal function. The left ventricle has no regional wall motion abnormalities. The left ventricular internal cavity size was normal in size. There is  mild concentric left ventricular hypertrophy. Left ventricular diastolic parameters are consistent with Grade I  diastolic dysfunction (impaired relaxation). Right Ventricle: The right ventricular size is normal. No increase in right ventricular wall thickness. Right ventricular systolic function is normal. Tricuspid regurgitation signal is inadequate for assessing PA pressure. Left Atrium: Left atrial size was mildly dilated. Right Atrium: Right atrial size was normal in size. Pericardium: There is no evidence of pericardial effusion. Mitral Valve: The mitral valve is normal in structure. Mild mitral annular calcification. No evidence of mitral valve regurgitation. Tricuspid Valve: The tricuspid valve is normal in structure. Tricuspid valve regurgitation is not demonstrated. Aortic Valve: The aortic valve is tricuspid. There is mild thickening of the aortic valve. Aortic valve regurgitation is not visualized. Aortic valve sclerosis is present, with no evidence of aortic valve stenosis. Aortic valve mean gradient measures 4.0  mmHg. Aortic valve peak gradient measures 7.1 mmHg. Aortic valve area, by VTI measures 2.30 cm. Pulmonic Valve: The pulmonic valve was grossly normal. Pulmonic valve regurgitation is not visualized. Aorta: The aortic root and ascending aorta are structurally normal, with no evidence of dilitation. IAS/Shunts: No atrial level shunt detected by color flow Doppler.  LEFT VENTRICLE PLAX 2D LVIDd:         3.70 cm   Diastology LVIDs:         2.50 cm   LV e' medial:    5.55 cm/s LV PW:         1.50 cm   LV E/e' medial:  18.7 LV IVS:  1.30 cm   LV e' lateral:   8.27 cm/s LVOT diam:     2.00 cm   LV E/e' lateral: 12.6 LV SV:         52 LV SV Index:   29 LVOT Area:     3.14 cm  RIGHT VENTRICLE             IVC RV Basal diam:  3.40 cm     IVC diam: 1.80 cm RV Mid diam:    3.30 cm RV S prime:     11.50 cm/s TAPSE (M-mode): 2.3 cm LEFT ATRIUM             Index        RIGHT ATRIUM           Index LA diam:        3.70 cm 2.07 cm/m   RA Area:     11.70 cm LA Vol (A2C):   62.4 ml 34.87 ml/m  RA Volume:   27.50  ml  15.37 ml/m LA Vol (A4C):   55.2 ml 30.82 ml/m LA Biplane Vol: 62.5 ml 34.93 ml/m  AORTIC VALVE                    PULMONIC VALVE AV Area (Vmax):    2.48 cm     PV Vmax:       0.95 m/s AV Area (Vmean):   2.38 cm     PV Peak grad:  3.6 mmHg AV Area (VTI):     2.30 cm AV Vmax:           133.00 cm/s AV Vmean:          89.100 cm/s AV VTI:            0.228 m AV Peak Grad:      7.1 mmHg AV Mean Grad:      4.0 mmHg LVOT Vmax:         105.00 cm/s LVOT Vmean:        67.400 cm/s LVOT VTI:          0.167 m LVOT/AV VTI ratio: 0.73  AORTA Ao Root diam: 2.80 cm Ao Asc diam:  2.70 cm MITRAL VALVE MV Area (PHT): 4.21 cm     SHUNTS MV Decel Time: 180 msec     Systemic VTI:  0.17 m MV E velocity: 104.00 cm/s  Systemic Diam: 2.00 cm MV A velocity: 129.00 cm/s MV E/A ratio:  0.81 Mihai Croitoru MD Electronically signed by Thurmon Fair MD Signature Date/Time: 03/25/2023/11:26:13 AM    Final         Scheduled Meds:  sodium chloride   Intravenous Once   sodium chloride   Intravenous Once   acetaminophen  1,000 mg Oral TID   acetaminophen  650 mg Oral Once   acidophilus  2 capsule Oral TID   apixaban  2.5 mg Oral BID   calcitRIOL  0.5 mcg Oral Daily   calcium carbonate  1 tablet Oral Daily   Chlorhexidine Gluconate Cloth  6 each Topical Q0600   Chlorhexidine Gluconate Cloth  6 each Topical Q0600   darbepoetin (ARANESP) injection - NON-DIALYSIS  100 mcg Subcutaneous Q Fri-1800   diclofenac  1 patch Transdermal BID   DULoxetine  60 mg Oral Daily   feeding supplement (NEPRO CARB STEADY)  237 mL Oral TID BM   heparin sodium (porcine)       lidocaine  2 patch Transdermal Q24H  methocarbamol  750 mg Oral TID   midodrine  10 mg Oral TID WC   mometasone-formoterol  2 puff Inhalation BID   multivitamin  1 tablet Oral QHS   oxyCODONE  15 mg Oral Q12H   polyethylene glycol  17 g Oral BID   pregabalin  75 mg Oral Daily   senna-docusate  1 tablet Oral BID   sodium chloride flush  10-40 mL Intracatheter Q12H    sodium chloride flush  3 mL Intravenous Q12H   sorbitol  30 mL Oral Q3H   Continuous Infusions:  albumin human     DAPTOmycin (CUBICIN) 650 mg in sodium chloride 0.9 % IVPB 126 mL/hr at 03/26/23 0700     LOS: 27 days    Time spent: 35 minutes    Ramiro Harvest, MD [facility]   To contact the attending provider between 7A-7P or the covering provider during after hours 7P-7A, please log into the web site [email or web address] and access using universal [place] password for that web site. If you do not have the password, please call the hospital operator.  03/26/2023, 11:22 AM

## 2023-03-26 NOTE — Procedures (Signed)
Patient was seen on dialysis and the procedure was supervised.  BFR 350  Via TDC BP is  96/75.   Patient appears to be tolerating treatment well  Cecille Aver 03/26/2023

## 2023-03-26 NOTE — Progress Notes (Signed)
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 TDC placement 03/12/23, appreciate assistance - CLIP to [facility] MWF 1145 (arrive 1115am 1st day) - Appreciate lt BCF by Dr. Chestine Spore on 5/15 -> great thrill -s/p HD on 5/24, had intradialytic hypotension required albumin and midodrine.  Plan for next HD on 5/27. - Continue MWF schedule.  # Anemia: Continue ESA and monitor hemoglobin.  Required blood transfusion..iron was OK   # CKD-MBD: Apparently the phosphorus level was low yesterday therefore received phosphorus repletion.  Phosphorus 4.2 today. Continue calcitriol for secondary hyperparathyroidism.  # HTN/volume: DC Lasix.  Continue midodrine pre-HD for intradialytic hypotension.  Manage volume with HD.  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  Last dose on 6/14. Ok to discharge from renal perspective.  # Hyponatremia, hypervolemic: Managed with dialysis.  Recommend fluid restriction.  # Acute febrile illness: Chest x-ray negative.  On antibiotics for OM.  Blood culture negative so far.  Afebrile today.  Patient is complaining of some abdominal pain therefore I recommend imaging studies.  I will inform the primary team.  # Hypokalemia: Order a dose of KCl, managed with higher potassium bath.  Subjective:  Patient is complaining of abdominal discomfort however denies nausea, vomiting, chest pain or shortness of breath.  Objective Vital signs in last 24 hours: Vitals:   03/26/23 0923 03/26/23 0926 03/26/23 1000 03/26/23 1030  BP:  (!) 109/49 (!) 96/53 96/75  Pulse:  70 69 70  Resp:  10 10 10   Temp:      TempSrc:      SpO2:  99% 99% 99%  Weight: 90 kg     Height:       Weight change: -1.3 kg  Intake/Output Summary (Last 24 hours) at 03/26/2023 1050 Last data filed at 03/26/2023 0700 Gross per 24 hour  Intake 670.18 ml  Output --  Net 670.18 ml       Labs: RENAL  PANEL Recent Labs  Lab 03/23/23 0306 03/23/23 1635 03/24/23 0306 03/25/23 0321 03/26/23 0502  NA 127* 129* 129* 127* 127*  K 3.4* 3.3* 3.5 3.2* 3.8  CL 91* 92* 93* 91* 92*  CO2 26 23 26 26 23   GLUCOSE 103* 131* 117* 82 75  BUN 40* 12 19 30* 43*  CREATININE 3.55* 1.69* 2.30* 3.14* 3.96*  CALCIUM 7.8* 8.1* 7.7* 7.7* 7.6*  PHOS 3.3 1.0* 4.2 3.7 4.4  ALBUMIN 2.3* 3.0* 2.6* 2.5* 2.5*    Liver Function Tests: Recent Labs  Lab 03/24/23 0306 03/25/23 0321 03/26/23 0502  ALBUMIN 2.6* 2.5* 2.5*   No results for input(s): "LIPASE", "AMYLASE" in the last 168 hours. No results for input(s): "AMMONIA" in the last 168 hours. CBC: Recent Labs    07/26/22 1258 08/09/22 1343 09/06/22 1325 09/06/22 1326 09/07/22 1433 10/10/22 1308 11/08/22 1243 11/15/22 1334 12/06/22 1326 12/06/22 1327 12/13/22 1424 01/12/23 1419 01/28/23 1539 03/02/23 0402 03/02/23 1558 03/23/23 0306 03/23/23 1635 03/24/23 0306 03/25/23 0321 03/26/23 0950  HGB 8.0*   < > 10.0*  --  10.5*   < > 9.9*   < > 8.9*  --    < > 8.3*   < > 7.6*   < > 6.1* 8.5* 8.1* 7.9* 7.7*  MCV 90.7   < > 88.7  --  86.7   < > 91.2   < > 95.5  --    < > 93.3   < >  91.5   < > 93.5 88.8 90.3 88.4 89.1  VITAMINB12  --   --   --   --  273  --   --   --   --   --   --   --   --   --   --   --   --   --   --   --   FERRITIN 394*  --   --  401*  --   --  258  --   --  209  --  277  --   --   --   --   --   --   --   --   TIBC 232*  --  258  --   --   --  256  --  269  --   --  263  --  217*  --   --   --   --   --   --   IRON 64  --  69  --   --   --  53  --  46  --   --  32  --  70  --   --   --   --   --   --   RETICCTPCT 2.2  --   --  1.4  --   --  2.1  --   --  2.1  --  2.6  --   --   --   --   --   --   --   --    < > = values in this interval not displayed.    Cardiac Enzymes: Recent Labs  Lab 03/20/23 0248  CKTOTAL 63   CBG: Recent Labs  Lab 03/20/23 2015 03/20/23 2339 03/21/23 0409 03/23/23 0748 03/23/23 1614   GLUCAP 130* 138* 116* 103* 133*    Iron Studies: No results for input(s): "IRON", "TIBC", "TRANSFERRIN", "FERRITIN" in the last 72 hours. Studies/Results: CT ABDOMEN PELVIS WO CONTRAST  Result Date: 03/25/2023 CLINICAL DATA:  Acute abdominal pain EXAM: CT ABDOMEN AND PELVIS WITHOUT CONTRAST TECHNIQUE: Multidetector CT imaging of the abdomen and pelvis was performed following the standard protocol without IV contrast. RADIATION DOSE REDUCTION: This exam was performed according to the departmental dose-optimization program which includes automated exposure control, adjustment of the mA and/or kV according to patient size and/or use of iterative reconstruction technique. COMPARISON:  CT 03/14/2023 FINDINGS: Lower chest: Mild dependent atelectasis in the left lobe, slightly improved from prior CT. Coronary artery calcifications. Hepatobiliary: Hepatic cirrhotic morphology with calcified granuloma, unchanged. No evidence of focal liver abnormality on this unenhanced exam. Cholecystectomy. Stable biliary tree. Pancreas: No ductal dilatation or inflammation. Spleen: Stable splenomegaly. Splenic granuloma again seen. Adrenals/Urinary Tract: No adrenal nodule. No hydronephrosis. Calcifications at the right renal hilum may be nonobstructing stones or vascular. Both ureters are decompressed. Moderate bladder distention without wall thickening. Stomach/Bowel: No bowel obstruction or inflammation. There is a small duodenal diverticulum. Moderate volume of colonic stool, slightly decreased stool burden from prior. Administered enteric contrast reaches the colon. The appendix is normal. Vascular/Lymphatic: Aortic atherosclerosis. Few prominent in periportal nodes are likely reactive. Reproductive: Status post hysterectomy. No adnexal masses. Other: No ascites. There is mild generalized body wall edema. Tiny fat containing umbilical hernia Musculoskeletal: Posterior thoracolumbar fusion hardware L1 corpectomy. Intact  hardware. Irregularity at the T10-T11 disc space and adjacent sclerosis was present on prior exam, site of previous osteomyelitis discitis. IMPRESSION: 1.  No acute findings. 2. Moderate colonic stool burden, slightly diminished from prior exam. Query constipation. 3. Cirrhosis and splenomegaly. 4. Moderate bladder distention without wall thickening. 5. Possible nonobstructing stones or vascular calcifications in the right renal hilum. Aortic Atherosclerosis (ICD10-I70.0). Electronically Signed   By: Narda Rutherford M.D.   On: 03/25/2023 22:26   ECHOCARDIOGRAM COMPLETE  Result Date: 03/25/2023    ECHOCARDIOGRAM REPORT   Patient Name:   [name] [name] Date of Exam: 03/25/2023 Medical Rec #:  [id number]         Height:       59.0 in Accession #:    [id number]        Weight:       186.3 lb Date of Birth:  01-01-1956         BSA:          1.790 m Patient Age:    67 years          BP:           163/75 mmHg Patient Gender: F                 HR:           97 bpm. Exam Location:  Inpatient Procedure: 2D Echo, Color Doppler and Cardiac Doppler Indications:    Chest Pain R07.9  History:        Patient has prior history of Echocardiogram examinations, most                 recent 06/21/2022. CHF, CAD and Previous Myocardial Infarction,                 ESRDon Dialysis; Risk Factors:Hypertension, Dyslipidemia,                 Diabetes and Non-Smoker.  Sonographer:    Dondra Prader RVT Referring Phys: [id number] DANIEL V THOMPSON  Sonographer Comments: Technically challenging study due to limited acoustic windows, Technically difficult study due to poor echo windows, suboptimal parasternal window, suboptimal apical window, suboptimal subcostal window and patient is obese. Image acquisition challenging due to patient body habitus and Image acquisition challenging due to respiratory motion. No IV access available for Definity IMPRESSIONS  1. Left ventricular ejection fraction, by estimation, is 60 to 65%. The left ventricle has normal  function. The left ventricle has no regional wall motion abnormalities. There is mild concentric left ventricular hypertrophy. Left ventricular diastolic parameters are consistent with Grade I diastolic dysfunction (impaired relaxation).  2. Right ventricular systolic function is normal. The right ventricular size is normal. Tricuspid regurgitation signal is inadequate for assessing PA pressure.  3. Left atrial size was mildly dilated.  4. The mitral valve is normal in structure. No evidence of mitral valve regurgitation.  5. The aortic valve is tricuspid. There is mild thickening of the aortic valve. Aortic valve regurgitation is not visualized. Aortic valve sclerosis is present, with no evidence of aortic valve stenosis. Comparison(s): No significant change from prior study. Prior images reviewed side by side. FINDINGS  Left Ventricle: Left ventricular ejection fraction, by estimation, is 60 to 65%. The left ventricle has normal function. The left ventricle has no regional wall motion abnormalities. The left ventricular internal cavity size was normal in size. There is  mild concentric left ventricular hypertrophy. Left ventricular diastolic parameters are consistent with Grade I diastolic dysfunction (impaired relaxation). Right Ventricle: The right ventricular size is normal. No increase in right ventricular wall thickness. Right ventricular systolic function is  normal. Tricuspid regurgitation signal is inadequate for assessing PA pressure. Left Atrium: Left atrial size was mildly dilated. Right Atrium: Right atrial size was normal in size. Pericardium: There is no evidence of pericardial effusion. Mitral Valve: The mitral valve is normal in structure. Mild mitral annular calcification. No evidence of mitral valve regurgitation. Tricuspid Valve: The tricuspid valve is normal in structure. Tricuspid valve regurgitation is not demonstrated. Aortic Valve: The aortic valve is tricuspid. There is mild thickening of  the aortic valve. Aortic valve regurgitation is not visualized. Aortic valve sclerosis is present, with no evidence of aortic valve stenosis. Aortic valve mean gradient measures 4.0  mmHg. Aortic valve peak gradient measures 7.1 mmHg. Aortic valve area, by VTI measures 2.30 cm. Pulmonic Valve: The pulmonic valve was grossly normal. Pulmonic valve regurgitation is not visualized. Aorta: The aortic root and ascending aorta are structurally normal, with no evidence of dilitation. IAS/Shunts: No atrial level shunt detected by color flow Doppler.  LEFT VENTRICLE PLAX 2D LVIDd:         3.70 cm   Diastology LVIDs:         2.50 cm   LV e' medial:    5.55 cm/s LV PW:         1.50 cm   LV E/e' medial:  18.7 LV IVS:        1.30 cm   LV e' lateral:   8.27 cm/s LVOT diam:     2.00 cm   LV E/e' lateral: 12.6 LV SV:         52 LV SV Index:   29 LVOT Area:     3.14 cm  RIGHT VENTRICLE             IVC RV Basal diam:  3.40 cm     IVC diam: 1.80 cm RV Mid diam:    3.30 cm RV S prime:     11.50 cm/s TAPSE (M-mode): 2.3 cm LEFT ATRIUM             Index        RIGHT ATRIUM           Index LA diam:        3.70 cm 2.07 cm/m   RA Area:     11.70 cm LA Vol (A2C):   62.4 ml 34.87 ml/m  RA Volume:   27.50 ml  15.37 ml/m LA Vol (A4C):   55.2 ml 30.82 ml/m LA Biplane Vol: 62.5 ml 34.93 ml/m  AORTIC VALVE                    PULMONIC VALVE AV Area (Vmax):    2.48 cm     PV Vmax:       0.95 m/s AV Area (Vmean):   2.38 cm     PV Peak grad:  3.6 mmHg AV Area (VTI):     2.30 cm AV Vmax:           133.00 cm/s AV Vmean:          89.100 cm/s AV VTI:            0.228 m AV Peak Grad:      7.1 mmHg AV Mean Grad:      4.0 mmHg LVOT Vmax:         105.00 cm/s LVOT Vmean:        67.400 cm/s LVOT VTI:          0.167 m LVOT/AV VTI ratio:  0.73  AORTA Ao Root diam: 2.80 cm Ao Asc diam:  2.70 cm MITRAL VALVE MV Area (PHT): 4.21 cm     SHUNTS MV Decel Time: 180 msec     Systemic VTI:  0.17 m MV E velocity: 104.00 cm/s  Systemic Diam: 2.00 cm MV A  velocity: 129.00 cm/s MV E/A ratio:  0.81 Mihai Croitoru MD Electronically signed by Thurmon Fair MD Signature Date/Time: 03/25/2023/11:26:13 AM    Final     Medications: Infusions:  DAPTOmycin (CUBICIN) 650 mg in sodium chloride 0.9 % IVPB 126 mL/hr at 03/26/23 0700    Scheduled Medications:  sodium chloride   Intravenous Once   sodium chloride   Intravenous Once   acetaminophen  1,000 mg Oral TID   acetaminophen  650 mg Oral Once   acidophilus  2 capsule Oral TID   apixaban  2.5 mg Oral BID   calcitRIOL  0.5 mcg Oral Daily   calcium carbonate  1 tablet Oral Daily   Chlorhexidine Gluconate Cloth  6 each Topical Q0600   Chlorhexidine Gluconate Cloth  6 each Topical Q0600   darbepoetin (ARANESP) injection - NON-DIALYSIS  100 mcg Subcutaneous Q Fri-1800   diclofenac  1 patch Transdermal BID   DULoxetine  60 mg Oral Daily   feeding supplement (NEPRO CARB STEADY)  237 mL Oral TID BM   heparin sodium (porcine)       lidocaine  2 patch Transdermal Q24H   methocarbamol  750 mg Oral TID   midodrine  10 mg Oral TID WC   mometasone-formoterol  2 puff Inhalation BID   multivitamin  1 tablet Oral QHS   oxyCODONE  15 mg Oral Q12H   polyethylene glycol  17 g Oral BID   pregabalin  75 mg Oral Daily   senna-docusate  1 tablet Oral BID   sodium chloride flush  10-40 mL Intracatheter Q12H   sodium chloride flush  3 mL Intravenous Q12H   sorbitol  30 mL Oral Q3H    have reviewed scheduled and prn medications.  Physical Exam: General:NAD, comfortable Heart:RRR, s1s2 nl Lungs:clear b/l, no crackle Abdomen:soft, Non-tender, non-distended Extremities:  pitting edema Dialysis Access: [facility] TDC, L BCF + T/B    Toniann Dickerson A Yair Dusza 03/26/2023,10:50 AM  LOS: 27 days

## 2023-03-27 ENCOUNTER — Inpatient Hospital Stay (HOSPITAL_COMMUNITY): Payer: PRIVATE HEALTH INSURANCE

## 2023-03-27 DIAGNOSIS — I5032 Chronic diastolic (congestive) heart failure: Secondary | ICD-10-CM | POA: Diagnosis not present

## 2023-03-27 DIAGNOSIS — F419 Anxiety disorder, unspecified: Secondary | ICD-10-CM | POA: Diagnosis not present

## 2023-03-27 DIAGNOSIS — I82503 Chronic embolism and thrombosis of unspecified deep veins of lower extremity, bilateral: Secondary | ICD-10-CM | POA: Diagnosis not present

## 2023-03-27 DIAGNOSIS — M4624 Osteomyelitis of vertebra, thoracic region: Secondary | ICD-10-CM | POA: Diagnosis not present

## 2023-03-27 LAB — RENAL FUNCTION PANEL
Albumin: 2.8 g/dL — ABNORMAL LOW (ref 3.5–5.0)
Anion gap: 12 (ref 5–15)
BUN: 28 mg/dL — ABNORMAL HIGH (ref 8–23)
CO2: 26 mmol/L (ref 22–32)
Calcium: 7.2 mg/dL — ABNORMAL LOW (ref 8.9–10.3)
Chloride: 90 mmol/L — ABNORMAL LOW (ref 98–111)
Creatinine, Ser: 3.47 mg/dL — ABNORMAL HIGH (ref 0.44–1.00)
GFR, Estimated: 14 mL/min — ABNORMAL LOW (ref >60)
Glucose, Bld: 62 mg/dL — ABNORMAL LOW (ref 70–99)
Phosphorus: 4 mg/dL (ref 2.5–4.6)
Potassium: 3.6 mmol/L (ref 3.5–5.1)
Sodium: 128 mmol/L — ABNORMAL LOW (ref 135–145)

## 2023-03-27 LAB — CULTURE, BLOOD (ROUTINE X 2): Special Requests: ADEQUATE

## 2023-03-27 LAB — CK: Total CK: 70 U/L (ref 38–234)

## 2023-03-27 MED ORDER — OXYCODONE HCL 5 MG PO TABS
10.0000 mg | ORAL_TABLET | ORAL | Status: AC
  Administered 2023-03-27: 10 mg via ORAL
  Filled 2023-03-27: qty 2

## 2023-03-27 MED ORDER — CHLORHEXIDINE GLUCONATE CLOTH 2 % EX PADS
6.0000 | MEDICATED_PAD | Freq: Every day | CUTANEOUS | Status: DC
  Administered 2023-03-28 – 2023-03-29 (×2): 6 via TOPICAL

## 2023-03-27 MED ORDER — DOCUSATE SODIUM 283 MG RE ENEM
1.0000 | ENEMA | Freq: Once | RECTAL | Status: AC
  Administered 2023-03-27: 283 mg via RECTAL
  Filled 2023-03-27: qty 1

## 2023-03-27 NOTE — Progress Notes (Signed)
OT Cancellation Note  Patient Details Name: [name] [name] MRN: [id number] DOB: 1956/02/03   Cancelled Treatment:    Reason Eval/Treat Not Completed: Patient declined, no reason specified, Pt lethargic, states may be due to meds, not feeling well, in pain.   Alexis Goodell 03/27/2023, 12:17 PM

## 2023-03-27 NOTE — Progress Notes (Addendum)
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  BMI baseline.  Presented to the hospital with complaints of back pain.  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  4/29 admitted to the hospital.  MRI controllable Looking for vertebral body, new marrow edema T3.  7 mm epidural fluid collection. 5/2 nephrology was consulted for AKI on CKD 5. 5/3 underwent IR guided T5 bone biopsy and T4-T5 disc aspiration.  IR also placed temp HD catheter for HD. 5/12 vascular surgery was consulted. 5/13 left TDC placement with vascular surgery. 5/14 palliative care consulted for pain management 5/15 underwent aVF placement left brachial cephalic. Had a Fall in the OR while being transferred from the OR table. 5/17 Accepted at [facility] SW GBO TTS pending pt's ability to sit for extended period of time. 5/18 1 PRBC transfusion ordered Due to drop in hemoglobin patient received 2 units PRBCs on 03/22/2026 during hemodialysis. -Patient also noted with constipation and placed on laxatives and enema.   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 -Imaging of MRI T-spine done 02/27/2023 with persistent T4-T5 discitis/osteomyelitis, in comparison to January 28, 2023, mild interval collapse in the T4 vertebral body.  Mild new marrow edema at T3 is new and likely represents osteomyelitis.  Mild interval increase in size of suspected dorsal epidural fluid collection measuring 7 mm, possibly abscess or phlegmon. -Blood cultures obtained 02/26/2023 were negative. -IR consulted patient underwent biopsy with no growth to date from aspiration. -Patient seen in consultation by ID who recommended IV daptomycin through 04/13/2023 with outpatient follow-up scheduled 04/12/2023.  2.  Pain control/chronic anxiety -Patient assessed by palliative care, prior to admission patient noted to be on Norco 5/325 4 times daily as needed also noted to have been on Valium before in the past.  Patient also noted on Lyrica 75 mg 3 times daily. -Patient seen by palliative care who are managing patient's pain. -  Patient currently on OxyContin 15 mg p.o. twice daily, oxycodone 5 to 10 mg p.o. every 4 hours as needed breakthrough pain, Cymbalta 60 mg daily, Lyrica 75 mg daily, scheduled Tylenol 1000 mg 3 times daily, diclofenac 1.3% patch twice daily, lidocaine patch 5% 2 patches daily, Robaxin-750  milligrams 3 times daily.  K-pad as tolerated. -Fentanyl discontinued by palliative care. -Patient will need to follow-up with PCP in the outpatient setting for further management of her pain and if need be may need a referral to the pain clinic from her PCP.   -Palliative care following and helping manage patient's pain in-house.  -Per palliative care.  3.  ATN/AKI on CKD 5>>>> ESRD on HD MWF -Patient noted to have a baseline creatinine around 4, serum creatinine peaked at 8 leading to initiation of HD 5/3. -TDC placed 03/12/2023 -AVF placement 03/14/2023 -Patient will need to be able to tolerate HD in recliner for outpatient HD, noted to have been able to tolerate HD on 5/20 but had significant pain after that. -Patient reassessed by nephrology whom at this time does not feel patient currently okay to go home with her mobility and not sure whether patient is a candidate for outpatient dialysis in the current shape as to whether she can sit up in chair for hemodialysis. -Nephrology following and appreciate input and recommendations.  4.  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 -Hemoglobin A1c 4.6 (02/14/2022) -Repeat hemoglobin A1c at 4.7 (03/21/2023) -Follow.  5.  Diarrhea/C. difficile antigen positive but toxigenic PCR negative/constipation -Patient noted to have had diarrhea workup which showed that patient was to was positive for C. difficile antigen.  Toxigenic PCR negative. -Patient noted to have received questran which resulted in constipation. -Currently on bowel regimen of Colace twice daily and MiraLAX daily which patient noted to have refused. -Continue MiraLAX twice daily, Senokot-S twice daily. -See #17.  6.  Urinary retention -Patient noted to have urinary retention early on in the hospitalization had Foley catheter placed 5/2 which was removed on 5/9 and required placement  again on 5/10, no prior history of urinary retention. -Patient also noted now ESRD on HD. -Foley catheter removed 03/19/2023. -Urine output of 520 cc over the past 24 hours.  -Discontinue bladder scans.  7.  Left lower extremity wound -Continue current wound care dressings with Aquacel, wound care RN following. Pressure Injury 02/26/23 Heel Left (Active)  02/26/23 1826  Location: Heel  Location Orientation: Left  Staging:   Wound Description (Comments):   Present on Admission: Yes   8 chronic thrombocytopenia -Secondary to cirrhosis. -Stable. -No overt bleeding.  9.  Normocytic anemia -Iron studies consistent with anemia of chronic disease. -Hemoglobin noted to have dropped down to 6.8 on 5/18, status post transfusion 1 unit PRBCs. -Hemoglobin of 6.1 on 03/23/2023 status post 2 more units PRBC transfusion with hemoglobin currently stable at 8.4.  -Status post IV iron and EPO support. -Transfusion threshold hemoglobin < 7. -Nephrology following.  10.  Chronic diastolic CHF -Volume management per hemodialysis. -Lasix discontinued.   11.  Hypertension -BP soft.  Patient was on Lasix which has subsequently been discontinued.  -On midodrine with hemodialysis.  12.  Depression/anxiety -Cymbalta, diazepam.    13.  Chronic lower extremity DVTs -Eliquis.    14.  Fall in the OR -Patient noted to have had a fall in the OR while being transferred from the OR table to the stretcher. -  CT chest abdomen and pelvis with no acute trauma noted. -CT head negative. -Plain films of the left shoulder negative for any fracture.  15.  Fever -Patient noted to have a temp of 103 (03/23/2023 ) post hemodialysis. -Chest x-ray obtained with no acute infiltrate. -Blood cultures ordered and pending with no growth to date x 4 days.. -Patient afebrile since 03/23/2023. -Continue IV daptomycin, scheduled Tylenol. -No further workup needed at this time.  16.  Chest pain/back pain/shortness of  breath -Patient noted on 03/23/2023 with complaints of back pain and some chest pain. -Patient noted to be treated for acute osteomyelitis, discitis of the T-spine T4, T5 patient with ongoing chronic pain. -EKG done 03/23/2023 with some T wave inversions in lead I and aVL new from prior EKG. -Cardiac enzymes flattened.  Chest x-ray no acute infiltrate.  2D echo with a EF of 60 to 65%, no wall motion abnormalities, mild concentric LVH, grade 1 diastolic dysfunction, mildly dilated left atrial size.  -Currently chest pain-free. -Continue current pain regimen.  17.  Abdominal pain secondary to constipation -Patient with complaints of abdominal pain diffusely on 03/25/2023 -??  Constipation as patient on opioid pain medication. -Patient noted to have refused MiraLAX on 03/25/2023.  -Dulcolax suppository ordered with no results.. -Due to discitis, osteomyelitis of the T-spine, CT abdomen and pelvis obtained with no significant acute abnormalities however consistent with constipation.  -MiraLAX increased to twice daily, Colace was discontinued patient currently on Senokot-S twice daily and still with no bowel movements.   -Patient received sorbitol p.o. x 2.   -Soapsuds enema ordered last night but not given unsure of reason why.   -Soapsuds enema x 1 today.   -Supportive care.      DVT prophylaxis: Eliquis Code Status: Full Family Communication: Updated patient.  No family at bedside. Disposition: Likely home with home health once cleared by nephrology and patient able to tolerate outpatient HD.    Status is: Inpatient Remains inpatient appropriate because: Severity of illness   Consultants:  ID: Dr. [name] 02/10/2023 Wound care RN IR: Dr.De Melchor Amour 02/28/2023 Nephrology: Dr. Glenna Fellows 03/01/2023 Vascular surgery: Dr. Randie Heinz 03/11/2023 Palliative care: Dr. Linna Darner 03/13/2023  Procedures:  CT head 03/14/2023 Chest x-ray 03/12/2023, Placement of tunneled left-sided hemodialysis catheter: Per  IR: Dr. Fredia Sorrow 03/12/2023 Plain films of left shoulder 03/14/2023 MRI T-spine 03/29/2023 Placement of left jugular nontunneled dialysis catheter using ultrasound and fluoroscopic guidance: Per IR, Dr. Lowella Dandy 03/02/2023 Renal ultrasound 03/01/2023 Upper extremity vein mapping 03/11/2023 Fluoroscopy guided T5 core biopsy and T4-5 disc aspiration: Per Dr.Katyucia, IR, 03/02/2023 Placement of left jugular type lumen dialysis catheter: Per IR, Dr. Lowella Dandy 03/02/2023 Left brachiocephalic AV fistula placement: Per vascular surgery: Dr. Chestine Spore 03/14/2023 Ultrasound-guided access left internal jugular vein, placement of left internal jugular vein tunneled dialysis catheter: Per Dr. Chestine Spore 03/12/2023 1 unit PRBC transfusion 03/17/2023 2 units PRBC transfusion 03/23/2023 2D echo 03/25/2023 CT abdomen and pelvis 03/25/2023  Antimicrobials: Anti-infectives (From admission, onward)    Start     Dose/Rate Route Frequency Ordered Stop   03/14/23 0000  vancomycin (VANCOCIN) IVPB 1000 mg/200 mL premix        1,000 mg 200 mL/hr over 60 Minutes Intravenous To Surgery 03/13/23 0731 03/14/23 1452   03/07/23 0000  daptomycin (CUBICIN) IVPB        650 mg Intravenous Every 48 hours 03/06/23 1302 04/13/23 2359   02/27/23 1630  DAPTOmycin (CUBICIN) 650 mg in sodium chloride 0.9 % IVPB  8 mg/kg  82.1 kg 126 mL/hr over 30 Minutes Intravenous Every 48 hours 02/26/23 1615     02/26/23 1145  cefTRIAXone (ROCEPHIN) 2 g in sodium chloride 0.9 % 100 mL IVPB        2 g 200 mL/hr over 30 Minutes Intravenous  Once 02/26/23 1143 02/26/23 1510         Subjective: Patient complaining of pain in the his sides and abdominal pain.  States does not feel well enough to go home today.  Noted to have refused enema yesterday however states she did not refuse and just did not feel too well at that time.  No chest pain.  No shortness of breath.    Objective: Vitals:   03/27/23 0523 03/27/23 0807 03/27/23 0916 03/27/23 1002  BP: (!) 157/64  (!) 190/79  (!) 110/46  Pulse: 68 78 74   Resp: 17 18 18    Temp: 97.6 F (36.4 C) 98.2 F (36.8 C)    TempSrc:  Oral    SpO2: 96% 96% 95%   Weight: 82.7 kg     Height:        Intake/Output Summary (Last 24 hours) at 03/27/2023 1036 Last data filed at 03/26/2023 2100 Gross per 24 hour  Intake --  Output 3520 ml  Net -3520 ml    Filed Weights   03/26/23 0923 03/26/23 1345 03/27/23 0523  Weight: 90 kg 87 kg 82.7 kg    Examination:  General exam: NAD. Respiratory system: CTAB anterior lung fields.  No wheezes, no crackles, no rhonchi.  Fair air movement.  Speaking in full sentences.  Cardiovascular system: RRR no murmurs rubs or gallops.  No JVD.  No lower extremity edema.  Gastrointestinal system: Abdomen is soft, nondistended, decreased diffuse tenderness to palpation.  Positive bowel sounds.  No rebound.  No guarding.   Central nervous system: Alert and oriented. No focal neurological deficits. Extremities: Symmetric 5 x 5 power. Skin: No rashes, lesions or ulcers Psychiatry: Judgement and insight appear normal. Mood & affect appropriate.     Data Reviewed: I have personally reviewed following labs and imaging studies  CBC: Recent Labs  Lab 03/23/23 1635 03/24/23 0306 03/25/23 0321 03/26/23 0950 03/26/23 1620  WBC 3.5* 3.4* 3.5* 3.7* 4.8  NEUTROABS 3.2 2.4  --  2.7  --   HGB 8.5* 8.1* 7.9* 7.7* 8.4*  HCT 26.2* 25.2* 23.7* 23.7* 26.2*  MCV 88.8 90.3 88.4 89.1 91.3  PLT 91* 103* 120* 102* 104*     Basic Metabolic Panel: Recent Labs  Lab 03/23/23 1635 03/24/23 0306 03/25/23 0321 03/26/23 0502 03/27/23 0400  NA 129* 129* 127* 127* 128*  K 3.3* 3.5 3.2* 3.8 3.6  CL 92* 93* 91* 92* 90*  CO2 23 26 26 23 26   GLUCOSE 131* 117* 82 75 62*  BUN 12 19 30* 43* 28*  CREATININE 1.69* 2.30* 3.14* 3.96* 3.47*  CALCIUM 8.1* 7.7* 7.7* 7.6* 7.2*  PHOS 1.0* 4.2 3.7 4.4 4.0     GFR: Estimated Creatinine Clearance (by C-G formula based on SCr of 3.47 mg/dL  (H)) Female: 16.1 mL/min (A) Female: 18 mL/min (A)  Liver Function Tests: Recent Labs  Lab 03/23/23 1635 03/24/23 0306 03/25/23 0321 03/26/23 0502 03/27/23 0400  ALBUMIN 3.0* 2.6* 2.5* 2.5* 2.8*     CBG: Recent Labs  Lab 03/20/23 2015 03/20/23 2339 03/21/23 0409 03/23/23 0748 03/23/23 1614  GLUCAP 130* 138* 116* 103* 133*      Recent Results (from the past 240 hour(s))  Culture, blood (Routine X 2) w Reflex to ID Panel     Status: None (Preliminary result)   Collection Time: 03/23/23  4:22 PM   Specimen: BLOOD  Result Value Ref Range Status   Specimen Description BLOOD RIGHT ANTECUBITAL  Final   Special Requests   Final    BOTTLES DRAWN AEROBIC AND ANAEROBIC Blood Culture adequate volume   Culture   Final    NO GROWTH 4 DAYS Performed at [facility], [address], [place], [place] [address]    Report Status PENDING  Incomplete  Culture, blood (Routine X 2) w Reflex to ID Panel     Status: None (Preliminary result)   Collection Time: 03/23/23  4:52 PM   Specimen: BLOOD RIGHT HAND  Result Value Ref Range Status   Specimen Description BLOOD RIGHT HAND  Final   Special Requests   Final    BOTTLES DRAWN AEROBIC ONLY Blood Culture adequate volume   Culture   Final    NO GROWTH 4 DAYS Performed at [facility], [address], [place], [place] [address]    Report Status PENDING  Incomplete         Radiology Studies: CT ABDOMEN PELVIS WO CONTRAST  Result Date: 03/25/2023 CLINICAL DATA:  Acute abdominal pain EXAM: CT ABDOMEN AND PELVIS WITHOUT CONTRAST TECHNIQUE: Multidetector CT imaging of the abdomen and pelvis was performed following the standard protocol without IV contrast. RADIATION DOSE REDUCTION: This exam was performed according to the departmental dose-optimization program which includes automated exposure control, adjustment of the mA and/or kV according to patient size and/or use of iterative reconstruction technique. COMPARISON:  CT  03/14/2023 FINDINGS: Lower chest: Mild dependent atelectasis in the left lobe, slightly improved from prior CT. Coronary artery calcifications. Hepatobiliary: Hepatic cirrhotic morphology with calcified granuloma, unchanged. No evidence of focal liver abnormality on this unenhanced exam. Cholecystectomy. Stable biliary tree. Pancreas: No ductal dilatation or inflammation. Spleen: Stable splenomegaly. Splenic granuloma again seen. Adrenals/Urinary Tract: No adrenal nodule. No hydronephrosis. Calcifications at the right renal hilum may be nonobstructing stones or vascular. Both ureters are decompressed. Moderate bladder distention without wall thickening. Stomach/Bowel: No bowel obstruction or inflammation. There is a small duodenal diverticulum. Moderate volume of colonic stool, slightly decreased stool burden from prior. Administered enteric contrast reaches the colon. The appendix is normal. Vascular/Lymphatic: Aortic atherosclerosis. Few prominent in periportal nodes are likely reactive. Reproductive: Status post hysterectomy. No adnexal masses. Other: No ascites. There is mild generalized body wall edema. Tiny fat containing umbilical hernia Musculoskeletal: Posterior thoracolumbar fusion hardware L1 corpectomy. Intact hardware. Irregularity at the T10-T11 disc space and adjacent sclerosis was present on prior exam, site of previous osteomyelitis discitis. IMPRESSION: 1. No acute findings. 2. Moderate colonic stool burden, slightly diminished from prior exam. Query constipation. 3. Cirrhosis and splenomegaly. 4. Moderate bladder distention without wall thickening. 5. Possible nonobstructing stones or vascular calcifications in the right renal hilum. Aortic Atherosclerosis (ICD10-I70.0). Electronically Signed   By: Narda Rutherford M.D.   On: 03/25/2023 22:26   ECHOCARDIOGRAM COMPLETE  Result Date: 03/25/2023    ECHOCARDIOGRAM REPORT   Patient Name:   [name] [name] Date of Exam: 03/25/2023 Medical Rec #:   [id number]         Height:       59.0 in Accession #:    [id number]        Weight:       186.3 lb Date of Birth:  06/23/1956  BSA:          1.790 m Patient Age:    67 years          BP:           163/75 mmHg Patient Gender: F                 HR:           97 bpm. Exam Location:  Inpatient Procedure: 2D Echo, Color Doppler and Cardiac Doppler Indications:    Chest Pain R07.9  History:        Patient has prior history of Echocardiogram examinations, most                 recent 06/21/2022. CHF, CAD and Previous Myocardial Infarction,                 ESRDon Dialysis; Risk Factors:Hypertension, Dyslipidemia,                 Diabetes and Non-Smoker.  Sonographer:    Dondra Prader RVT Referring Phys: [id number] Algie Westry V Shali Vesey  Sonographer Comments: Technically challenging study due to limited acoustic windows, Technically difficult study due to poor echo windows, suboptimal parasternal window, suboptimal apical window, suboptimal subcostal window and patient is obese. Image acquisition challenging due to patient body habitus and Image acquisition challenging due to respiratory motion. No IV access available for Definity IMPRESSIONS  1. Left ventricular ejection fraction, by estimation, is 60 to 65%. The left ventricle has normal function. The left ventricle has no regional wall motion abnormalities. There is mild concentric left ventricular hypertrophy. Left ventricular diastolic parameters are consistent with Grade I diastolic dysfunction (impaired relaxation).  2. Right ventricular systolic function is normal. The right ventricular size is normal. Tricuspid regurgitation signal is inadequate for assessing PA pressure.  3. Left atrial size was mildly dilated.  4. The mitral valve is normal in structure. No evidence of mitral valve regurgitation.  5. The aortic valve is tricuspid. There is mild thickening of the aortic valve. Aortic valve regurgitation is not visualized. Aortic valve sclerosis is present, with no evidence  of aortic valve stenosis. Comparison(s): No significant change from prior study. Prior images reviewed side by side. FINDINGS  Left Ventricle: Left ventricular ejection fraction, by estimation, is 60 to 65%. The left ventricle has normal function. The left ventricle has no regional wall motion abnormalities. The left ventricular internal cavity size was normal in size. There is  mild concentric left ventricular hypertrophy. Left ventricular diastolic parameters are consistent with Grade I diastolic dysfunction (impaired relaxation). Right Ventricle: The right ventricular size is normal. No increase in right ventricular wall thickness. Right ventricular systolic function is normal. Tricuspid regurgitation signal is inadequate for assessing PA pressure. Left Atrium: Left atrial size was mildly dilated. Right Atrium: Right atrial size was normal in size. Pericardium: There is no evidence of pericardial effusion. Mitral Valve: The mitral valve is normal in structure. Mild mitral annular calcification. No evidence of mitral valve regurgitation. Tricuspid Valve: The tricuspid valve is normal in structure. Tricuspid valve regurgitation is not demonstrated. Aortic Valve: The aortic valve is tricuspid. There is mild thickening of the aortic valve. Aortic valve regurgitation is not visualized. Aortic valve sclerosis is present, with no evidence of aortic valve stenosis. Aortic valve mean gradient measures 4.0  mmHg. Aortic valve peak gradient measures 7.1 mmHg. Aortic valve area, by VTI measures 2.30 cm. Pulmonic Valve: The pulmonic valve was grossly normal. Pulmonic valve regurgitation is not  visualized. Aorta: The aortic root and ascending aorta are structurally normal, with no evidence of dilitation. IAS/Shunts: No atrial level shunt detected by color flow Doppler.  LEFT VENTRICLE PLAX 2D LVIDd:         3.70 cm   Diastology LVIDs:         2.50 cm   LV e' medial:    5.55 cm/s LV PW:         1.50 cm   LV E/e' medial:  18.7  LV IVS:        1.30 cm   LV e' lateral:   8.27 cm/s LVOT diam:     2.00 cm   LV E/e' lateral: 12.6 LV SV:         52 LV SV Index:   29 LVOT Area:     3.14 cm  RIGHT VENTRICLE             IVC RV Basal diam:  3.40 cm     IVC diam: 1.80 cm RV Mid diam:    3.30 cm RV S prime:     11.50 cm/s TAPSE (M-mode): 2.3 cm LEFT ATRIUM             Index        RIGHT ATRIUM           Index LA diam:        3.70 cm 2.07 cm/m   RA Area:     11.70 cm LA Vol (A2C):   62.4 ml 34.87 ml/m  RA Volume:   27.50 ml  15.37 ml/m LA Vol (A4C):   55.2 ml 30.82 ml/m LA Biplane Vol: 62.5 ml 34.93 ml/m  AORTIC VALVE                    PULMONIC VALVE AV Area (Vmax):    2.48 cm     PV Vmax:       0.95 m/s AV Area (Vmean):   2.38 cm     PV Peak grad:  3.6 mmHg AV Area (VTI):     2.30 cm AV Vmax:           133.00 cm/s AV Vmean:          89.100 cm/s AV VTI:            0.228 m AV Peak Grad:      7.1 mmHg AV Mean Grad:      4.0 mmHg LVOT Vmax:         105.00 cm/s LVOT Vmean:        67.400 cm/s LVOT VTI:          0.167 m LVOT/AV VTI ratio: 0.73  AORTA Ao Root diam: 2.80 cm Ao Asc diam:  2.70 cm MITRAL VALVE MV Area (PHT): 4.21 cm     SHUNTS MV Decel Time: 180 msec     Systemic VTI:  0.17 m MV E velocity: 104.00 cm/s  Systemic Diam: 2.00 cm MV A velocity: 129.00 cm/s MV E/A ratio:  0.81 Mihai Croitoru MD Electronically signed by Thurmon Fair MD Signature Date/Time: 03/25/2023/11:26:13 AM    Final         Scheduled Meds:  sodium chloride   Intravenous Once   sodium chloride   Intravenous Once   acetaminophen  1,000 mg Oral TID   acetaminophen  650 mg Oral Once   acidophilus  2 capsule Oral TID   apixaban  2.5 mg Oral BID   calcitRIOL  0.5 mcg Oral Daily   calcium carbonate  1 tablet Oral Daily   Chlorhexidine Gluconate Cloth  6 each Topical Q0600   Chlorhexidine Gluconate Cloth  6 each Topical Q0600   darbepoetin (ARANESP) injection - NON-DIALYSIS  100 mcg Subcutaneous Q Fri-1800   diclofenac  1 patch Transdermal BID   docusate  sodium  1 enema Rectal Once   DULoxetine  60 mg Oral Daily   feeding supplement (NEPRO CARB STEADY)  237 mL Oral TID BM   lidocaine  2 patch Transdermal Q24H   methocarbamol  750 mg Oral TID   midodrine  10 mg Oral TID WC   mometasone-formoterol  2 puff Inhalation BID   multivitamin  1 tablet Oral QHS   oxyCODONE  15 mg Oral Q12H   polyethylene glycol  17 g Oral BID   pregabalin  75 mg Oral Daily   senna-docusate  1 tablet Oral BID   sodium chloride flush  10-40 mL Intracatheter Q12H   sodium chloride flush  3 mL Intravenous Q12H   Continuous Infusions:  anticoagulant sodium citrate     DAPTOmycin (CUBICIN) 650 mg in sodium chloride 0.9 % IVPB 126 mL/hr at 03/26/23 0700     LOS: 28 days    Time spent: 40 minutes    Ramiro Harvest, MD [facility]   To contact the attending provider between 7A-7P or the covering provider during after hours 7P-7A, please log into the web site [email or web address] and access using universal [place] password for that web site. If you do not have the password, please call the hospital operator.  03/27/2023, 10:36 AM

## 2023-03-27 NOTE — Progress Notes (Signed)
Soaps enema given. Pt tolerated around . No BM thus far.

## 2023-03-27 NOTE — Progress Notes (Signed)
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 TDC placement 03/12/23, appreciate assistance - CLIP to [facility] MWF 1145 (arrive 1115am 1st day) - Appreciate lt BCF by Dr. Chestine Spore on 5/15 -> great thrill -s/p HD on 5/27, had intradialytic hypotension required albumin and midodrine.  Plan for next HD on 5/29. - Continue MWF schedule.  Based on what I saw today I cannot see where she could go home-  her mobility is terrible-  will also need to see if can sit up in chair for HD - not sure she is a candidate for OP dialysis in this shape   # Anemia: Continue ESA and monitor hemoglobin.  Required blood transfusion..iron was OK   # CKD-MBD: Apparently the phosphorus level was low yesterday therefore received phosphorus repletion.  Has not needed binder. Continue calcitriol for secondary hyperparathyroidism.  # HTN/volume: DC Lasix.  Continue midodrine pre-HD for intradialytic hypotension.  Manage volume with HD.  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  Last dose on 6/14. Ok to discharge from renal perspective.  # Hyponatremia, hypervolemic: Managed with dialysis.   fluid restriction.  # Acute febrile illness: Chest x-ray negative.  On antibiotics for OM.  Blood culture negative so far.  Afebrile today.      # Hypokalemia:  managed with higher potassium bath.  Subjective:  s/p HD yest-  removed 3 liters-  did need albumin to remove.   otherwise tolerated well.  I enter the room with nursing-  she is lying perpendicular in the bed-  required 2 of [name] to move her-  she is screaming in pain-  in her back  Objective Vital signs in last 24 hours: Vitals:   03/26/23 2106 03/27/23 0523 03/27/23 0807 03/27/23 0916  BP: 124/62 (!) 157/64 (!) 190/79   Pulse: 88 68 78 74  Resp: 17 17 18 18   Temp: 99.1 F (37.3 C) 97.6 F (36.4 C) 98.2 F (36.8 C)   TempSrc: Oral  Oral   SpO2: 96% 96% 96% 95%   Weight:  82.7 kg    Height:       Weight change: 6.8 kg  Intake/Output Summary (Last 24 hours) at 03/27/2023 0951 Last data filed at 03/26/2023 2100 Gross per 24 hour  Intake --  Output 3520 ml  Net -3520 ml       Labs: RENAL PANEL Recent Labs  Lab 03/23/23 1635 03/24/23 0306 03/25/23 0321 03/26/23 0502 03/27/23 0400  NA 129* 129* 127* 127* 128*  K 3.3* 3.5 3.2* 3.8 3.6  CL 92* 93* 91* 92* 90*  CO2 23 26 26 23 26   GLUCOSE 131* 117* 82 75 62*  BUN 12 19 30* 43* 28*  CREATININE 1.69* 2.30* 3.14* 3.96* 3.47*  CALCIUM 8.1* 7.7* 7.7* 7.6* 7.2*  PHOS 1.0* 4.2 3.7 4.4 4.0  ALBUMIN 3.0* 2.6* 2.5* 2.5* 2.8*    Liver Function Tests: Recent Labs  Lab 03/25/23 0321 03/26/23 0502 03/27/23 0400  ALBUMIN 2.5* 2.5* 2.8*   No results for input(s): "LIPASE", "AMYLASE" in the last 168 hours. No results for input(s): "AMMONIA" in the last 168 hours. CBC: Recent Labs    07/26/22 1258 08/09/22 1343 09/06/22 1325 09/06/22 1326 09/07/22 1433 10/10/22 1308 11/08/22 1243 11/15/22 1334 12/06/22 1326 12/06/22 1327 12/13/22 1424 01/12/23 1419 01/28/23 1539 03/02/23 0402 03/02/23 1558 03/23/23 1635 03/24/23 0306 03/25/23 0321 03/26/23 0950 03/26/23 1620  HGB  8.0*   < > 10.0*  --  10.5*   < > 9.9*   < > 8.9*  --    < > 8.3*   < > 7.6*   < > 8.5* 8.1* 7.9* 7.7* 8.4*  MCV 90.7   < > 88.7  --  86.7   < > 91.2   < > 95.5  --    < > 93.3   < > 91.5   < > 88.8 90.3 88.4 89.1 91.3  VITAMINB12  --   --   --   --  273  --   --   --   --   --   --   --   --   --   --   --   --   --   --   --   FERRITIN 394*  --   --  401*  --   --  258  --   --  209  --  277  --   --   --   --   --   --   --   --   TIBC 232*  --  258  --   --   --  256  --  269  --   --  263  --  217*  --   --   --   --   --   --   IRON 64  --  69  --   --   --  53  --  46  --   --  32  --  70  --   --   --   --   --   --   RETICCTPCT 2.2  --   --  1.4  --   --  2.1  --   --  2.1  --  2.6  --   --   --   --   --    --   --   --    < > = values in this interval not displayed.    Cardiac Enzymes: Recent Labs  Lab 03/27/23 0400  CKTOTAL 70   CBG: Recent Labs  Lab 03/20/23 2015 03/20/23 2339 03/21/23 0409 03/23/23 0748 03/23/23 1614  GLUCAP 130* 138* 116* 103* 133*    Iron Studies: No results for input(s): "IRON", "TIBC", "TRANSFERRIN", "FERRITIN" in the last 72 hours. Studies/Results: CT ABDOMEN PELVIS WO CONTRAST  Result Date: 03/25/2023 CLINICAL DATA:  Acute abdominal pain EXAM: CT ABDOMEN AND PELVIS WITHOUT CONTRAST TECHNIQUE: Multidetector CT imaging of the abdomen and pelvis was performed following the standard protocol without IV contrast. RADIATION DOSE REDUCTION: This exam was performed according to the departmental dose-optimization program which includes automated exposure control, adjustment of the mA and/or kV according to patient size and/or use of iterative reconstruction technique. COMPARISON:  CT 03/14/2023 FINDINGS: Lower chest: Mild dependent atelectasis in the left lobe, slightly improved from prior CT. Coronary artery calcifications. Hepatobiliary: Hepatic cirrhotic morphology with calcified granuloma, unchanged. No evidence of focal liver abnormality on this unenhanced exam. Cholecystectomy. Stable biliary tree. Pancreas: No ductal dilatation or inflammation. Spleen: Stable splenomegaly. Splenic granuloma again seen. Adrenals/Urinary Tract: No adrenal nodule. No hydronephrosis. Calcifications at the right renal hilum may be nonobstructing stones or vascular. Both ureters are decompressed. Moderate bladder distention without wall thickening. Stomach/Bowel: No bowel obstruction or inflammation. There is a small duodenal diverticulum. Moderate volume of colonic stool, slightly decreased stool burden  from prior. Administered enteric contrast reaches the colon. The appendix is normal. Vascular/Lymphatic: Aortic atherosclerosis. Few prominent in periportal nodes are likely reactive.  Reproductive: Status post hysterectomy. No adnexal masses. Other: No ascites. There is mild generalized body wall edema. Tiny fat containing umbilical hernia Musculoskeletal: Posterior thoracolumbar fusion hardware L1 corpectomy. Intact hardware. Irregularity at the T10-T11 disc space and adjacent sclerosis was present on prior exam, site of previous osteomyelitis discitis. IMPRESSION: 1. No acute findings. 2. Moderate colonic stool burden, slightly diminished from prior exam. Query constipation. 3. Cirrhosis and splenomegaly. 4. Moderate bladder distention without wall thickening. 5. Possible nonobstructing stones or vascular calcifications in the right renal hilum. Aortic Atherosclerosis (ICD10-I70.0). Electronically Signed   By: Narda Rutherford M.D.   On: 03/25/2023 22:26   ECHOCARDIOGRAM COMPLETE  Result Date: 03/25/2023    ECHOCARDIOGRAM REPORT   Patient Name:   [name] [name] Date of Exam: 03/25/2023 Medical Rec #:  [id number]         Height:       59.0 in Accession #:    [id number]        Weight:       186.3 lb Date of Birth:  Jun 11, 1956         BSA:          1.790 m Patient Age:    67 years          BP:           163/75 mmHg Patient Gender: F                 HR:           97 bpm. Exam Location:  Inpatient Procedure: 2D Echo, Color Doppler and Cardiac Doppler Indications:    Chest Pain R07.9  History:        Patient has prior history of Echocardiogram examinations, most                 recent 06/21/2022. CHF, CAD and Previous Myocardial Infarction,                 ESRDon Dialysis; Risk Factors:Hypertension, Dyslipidemia,                 Diabetes and Non-Smoker.  Sonographer:    Dondra Prader RVT Referring Phys: [id number] DANIEL V THOMPSON  Sonographer Comments: Technically challenging study due to limited acoustic windows, Technically difficult study due to poor echo windows, suboptimal parasternal window, suboptimal apical window, suboptimal subcostal window and patient is obese. Image acquisition challenging  due to patient body habitus and Image acquisition challenging due to respiratory motion. No IV access available for Definity IMPRESSIONS  1. Left ventricular ejection fraction, by estimation, is 60 to 65%. The left ventricle has normal function. The left ventricle has no regional wall motion abnormalities. There is mild concentric left ventricular hypertrophy. Left ventricular diastolic parameters are consistent with Grade I diastolic dysfunction (impaired relaxation).  2. Right ventricular systolic function is normal. The right ventricular size is normal. Tricuspid regurgitation signal is inadequate for assessing PA pressure.  3. Left atrial size was mildly dilated.  4. The mitral valve is normal in structure. No evidence of mitral valve regurgitation.  5. The aortic valve is tricuspid. There is mild thickening of the aortic valve. Aortic valve regurgitation is not visualized. Aortic valve sclerosis is present, with no evidence of aortic valve stenosis. Comparison(s): No significant change from prior study. Prior images reviewed side by side. FINDINGS  Left Ventricle:  Left ventricular ejection fraction, by estimation, is 60 to 65%. The left ventricle has normal function. The left ventricle has no regional wall motion abnormalities. The left ventricular internal cavity size was normal in size. There is  mild concentric left ventricular hypertrophy. Left ventricular diastolic parameters are consistent with Grade I diastolic dysfunction (impaired relaxation). Right Ventricle: The right ventricular size is normal. No increase in right ventricular wall thickness. Right ventricular systolic function is normal. Tricuspid regurgitation signal is inadequate for assessing PA pressure. Left Atrium: Left atrial size was mildly dilated. Right Atrium: Right atrial size was normal in size. Pericardium: There is no evidence of pericardial effusion. Mitral Valve: The mitral valve is normal in structure. Mild mitral annular  calcification. No evidence of mitral valve regurgitation. Tricuspid Valve: The tricuspid valve is normal in structure. Tricuspid valve regurgitation is not demonstrated. Aortic Valve: The aortic valve is tricuspid. There is mild thickening of the aortic valve. Aortic valve regurgitation is not visualized. Aortic valve sclerosis is present, with no evidence of aortic valve stenosis. Aortic valve mean gradient measures 4.0  mmHg. Aortic valve peak gradient measures 7.1 mmHg. Aortic valve area, by VTI measures 2.30 cm. Pulmonic Valve: The pulmonic valve was grossly normal. Pulmonic valve regurgitation is not visualized. Aorta: The aortic root and ascending aorta are structurally normal, with no evidence of dilitation. IAS/Shunts: No atrial level shunt detected by color flow Doppler.  LEFT VENTRICLE PLAX 2D LVIDd:         3.70 cm   Diastology LVIDs:         2.50 cm   LV e' medial:    5.55 cm/s LV PW:         1.50 cm   LV E/e' medial:  18.7 LV IVS:        1.30 cm   LV e' lateral:   8.27 cm/s LVOT diam:     2.00 cm   LV E/e' lateral: 12.6 LV SV:         52 LV SV Index:   29 LVOT Area:     3.14 cm  RIGHT VENTRICLE             IVC RV Basal diam:  3.40 cm     IVC diam: 1.80 cm RV Mid diam:    3.30 cm RV S prime:     11.50 cm/s TAPSE (M-mode): 2.3 cm LEFT ATRIUM             Index        RIGHT ATRIUM           Index LA diam:        3.70 cm 2.07 cm/m   RA Area:     11.70 cm LA Vol (A2C):   62.4 ml 34.87 ml/m  RA Volume:   27.50 ml  15.37 ml/m LA Vol (A4C):   55.2 ml 30.82 ml/m LA Biplane Vol: 62.5 ml 34.93 ml/m  AORTIC VALVE                    PULMONIC VALVE AV Area (Vmax):    2.48 cm     PV Vmax:       0.95 m/s AV Area (Vmean):   2.38 cm     PV Peak grad:  3.6 mmHg AV Area (VTI):     2.30 cm AV Vmax:           133.00 cm/s AV Vmean:  89.100 cm/s AV VTI:            0.228 m AV Peak Grad:      7.1 mmHg AV Mean Grad:      4.0 mmHg LVOT Vmax:         105.00 cm/s LVOT Vmean:        67.400 cm/s LVOT VTI:           0.167 m LVOT/AV VTI ratio: 0.73  AORTA Ao Root diam: 2.80 cm Ao Asc diam:  2.70 cm MITRAL VALVE MV Area (PHT): 4.21 cm     SHUNTS MV Decel Time: 180 msec     Systemic VTI:  0.17 m MV E velocity: 104.00 cm/s  Systemic Diam: 2.00 cm MV A velocity: 129.00 cm/s MV E/A ratio:  0.81 Mihai Croitoru MD Electronically signed by Thurmon Fair MD Signature Date/Time: 03/25/2023/11:26:13 AM    Final     Medications: Infusions:  anticoagulant sodium citrate     DAPTOmycin (CUBICIN) 650 mg in sodium chloride 0.9 % IVPB 126 mL/hr at 03/26/23 0700    Scheduled Medications:  sodium chloride   Intravenous Once   sodium chloride   Intravenous Once   acetaminophen  1,000 mg Oral TID   acetaminophen  650 mg Oral Once   acidophilus  2 capsule Oral TID   apixaban  2.5 mg Oral BID   calcitRIOL  0.5 mcg Oral Daily   calcium carbonate  1 tablet Oral Daily   Chlorhexidine Gluconate Cloth  6 each Topical Q0600   Chlorhexidine Gluconate Cloth  6 each Topical Q0600   darbepoetin (ARANESP) injection - NON-DIALYSIS  100 mcg Subcutaneous Q Fri-1800   diclofenac  1 patch Transdermal BID   docusate sodium  1 enema Rectal Once   DULoxetine  60 mg Oral Daily   feeding supplement (NEPRO CARB STEADY)  237 mL Oral TID BM   lidocaine  2 patch Transdermal Q24H   methocarbamol  750 mg Oral TID   midodrine  10 mg Oral TID WC   mometasone-formoterol  2 puff Inhalation BID   multivitamin  1 tablet Oral QHS   oxyCODONE  15 mg Oral Q12H   polyethylene glycol  17 g Oral BID   pregabalin  75 mg Oral Daily   senna-docusate  1 tablet Oral BID   sodium chloride flush  10-40 mL Intracatheter Q12H   sodium chloride flush  3 mL Intravenous Q12H    have reviewed scheduled and prn medications.  Physical Exam: General: eyes closed-  wont move-  screams when we move her to more appropriate position  Heart:RRR, s1s2 nl Lungs:clear b/l, no crackle Abdomen:soft, Non-tender, non-distended Extremities:  pitting edema Dialysis Access:  [facility] TDC, L BCF + T/B    [name] A Arnitra Sokoloski 03/27/2023,9:51 AM  LOS: 28 days

## 2023-03-27 NOTE — Progress Notes (Signed)
Daily Progress Note   Patient Name: [name] [name]       Date: 03/27/2023 DOB: 25-Jan-1956  Age: 67 y.o. MRN#: [id number] Attending Physician: Rodolph Bong, MD Primary Care Physician: Zola Button, Grayling Congress, DO Admit Date: 02/26/2023  Reason for Consultation/Follow-up: Establishing goals of care and Pain control  Subjective: Patient laying in bed. She reports having a rough day yesterday. No family at bedside.  Length of Stay: 28  Current Medications: Scheduled Meds:  . sodium chloride   Intravenous Once  . sodium chloride   Intravenous Once  . acetaminophen  1,000 mg Oral TID  . acetaminophen  650 mg Oral Once  . acidophilus  2 capsule Oral TID  . apixaban  2.5 mg Oral BID  . calcitRIOL  0.5 mcg Oral Daily  . calcium carbonate  1 tablet Oral Daily  . Chlorhexidine Gluconate Cloth  6 each Topical Q0600  . Chlorhexidine Gluconate Cloth  6 each Topical Q0600  . darbepoetin (ARANESP) injection - NON-DIALYSIS  100 mcg Subcutaneous Q Fri-1800  . diclofenac  1 patch Transdermal BID  . docusate sodium  1 enema Rectal Once  . DULoxetine  60 mg Oral Daily  . feeding supplement (NEPRO CARB STEADY)  237 mL Oral TID BM  . lidocaine  2 patch Transdermal Q24H  . methocarbamol  750 mg Oral TID  . midodrine  10 mg Oral TID WC  . mometasone-formoterol  2 puff Inhalation BID  . multivitamin  1 tablet Oral QHS  . oxyCODONE  10 mg Oral NOW  . oxyCODONE  15 mg Oral Q12H  . polyethylene glycol  17 g Oral BID  . pregabalin  75 mg Oral Daily  . senna-docusate  1 tablet Oral BID  . sodium chloride flush  10-40 mL Intracatheter Q12H  . sodium chloride flush  3 mL Intravenous Q12H    Continuous Infusions: . anticoagulant sodium citrate    . DAPTOmycin (CUBICIN) 650 mg in sodium chloride 0.9  % IVPB 126 mL/hr at 03/26/23 0700    PRN Meds: albuterol, alteplase, alteplase, anticoagulant sodium citrate, diazepam, heparin, heparin, heparin, hydrALAZINE, lidocaine (PF), lidocaine (PF), lidocaine-prilocaine, lidocaine-prilocaine, naLOXone (NARCAN)  injection, nitroGLYCERIN, ondansetron (ZOFRAN) IV, mouth rinse, oxyCODONE, oxyCODONE, pentafluoroprop-tetrafluoroeth, pentafluoroprop-tetrafluoroeth  Physical Exam Constitutional:      Appearance: She is ill-appearing.  HENT:  Mouth/Throat:     Mouth: Mucous membranes are moist.  Pulmonary:     Effort: Pulmonary effort is normal.  Musculoskeletal:     Comments: Generalized weakness   Skin:    General: Skin is warm and dry.  Neurological:     Mental Status: She is alert.  Psychiatric:        Behavior: Behavior normal.             Vital Signs: BP (!) 190/79 (BP Location: Right Arm)   Pulse 78   Temp 98.2 F (36.8 C) (Oral)   Resp 18   Ht 4\' 11"  (1.499 m)   Wt 82.7 kg   SpO2 96%   BMI 36.82 kg/m  SpO2: SpO2: 96 % O2 Device: O2 Device: Nasal Cannula O2 Flow Rate: O2 Flow Rate (L/min): 2 L/min  Intake/output summary:  Intake/Output Summary (Last 24 hours) at 03/27/2023 1610 Last data filed at 03/26/2023 2100 Gross per 24 hour  Intake --  Output 3520 ml  Net -3520 ml   LBM: Last BM Date : 03/24/23 Baseline Weight: Weight: 82.1 kg Most recent weight: Weight: 82.7 kg       Palliative Assessment/Data: 40%      Patient Active Problem List   Diagnosis Date Noted  . Fever 03/23/2023  . Chronic diastolic CHF (congestive heart failure) (HCC) 03/21/2023  . Thrombocytopenia (HCC) 03/21/2023  . ESRD on dialysis (HCC) 03/21/2023  . Acute osteomyelitis of thoracic spine (HCC) 02/27/2023  . Vertebral osteomyelitis (HCC) 01/28/2023  . 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 Osteomyelitis (HCC) 06/20/2022  . Estrogen deficiency 05/25/2022  . CKD (chronic kidney disease) stage 5, GFR less than 15 ml/min (HCC) 02/14/2022   . Prolonged QT interval 01/07/2022  . Streptococcal bacteremia   . Osteomyelitis of left foot (HCC) 10/16/2021  . Diabetic foot ulcers (HCC) 10/16/2021  . Portal hypertension (HCC) 08/16/2021  . Eczema of scalp 06/27/2021  . Hypothyroidism 06/27/2021  . Pruritus 06/27/2021  . Seasonal allergic rhinitis due to pollen 02/02/2021  . PTSD (post-traumatic stress disorder)   . Osteoarthritis   . MRSA (methicillin resistant Staphylococcus aureus)   . Hypertension   . High cholesterol   . Hepatic steatosis   . GERD (gastroesophageal reflux disease)   . Fibromyalgia   . Episode of visual loss of left eye   . Depression   . Constipation   . Cataract   . Bulging lumbar disc   . Asthma   . Anxiety   . Anemia   . Tinea capitis 10/11/2020  . Tinea corporis 10/11/2020  . Hip pain 05/16/2020  . IDA (iron deficiency anemia) 05/04/2020  . 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 S/P lumbar fusion 03/04/2020  . Spinal stenosis of lumbar region 01/22/2020  . 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 Compression fracture of L1 lumbar vertebra (HCC) 11/17/2019  . Fracture of multiple ribs 11/17/2019  . Nausea & vomiting 11/15/2019  . Diarrhea 09/04/2019  . Pressure injury of skin 04/17/2019  . Septic arthritis of elbow, right (HCC) 04/17/2019  . 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 Sleep apnea 11/16/2017  . Diabetic foot infection (HCC) 04/06/2017  . Type 2 diabetes mellitus with hyperglycemia, with long-term current use of insulin (HCC) 04/06/2017  . Neuropathy 05/05/2015  . Diabetic peripheral neuropathy (HCC) 01/12/2015  . CKD (chronic kidney disease), stage III (HCC) 05/27/2014  . History of MI (myocardial infarction) 10/06/2013  .  Chest pain 10/06/2013  . Nausea and vomiting 10/06/2013  . Obesity (BMI 30-39.9) 09/20/2013  . 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 Normocytic anemia  02/03/2013  . CAD (coronary artery disease) 01/11/2012  . UTI (urinary tract infection) 08/05/2010  . Hepatic cirrhosis (HCC) 07/06/2010  . 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  . Hyperlipidemia 06/03/2008  . Essential hypertension 12/23/2006  . Acute MI [facility]) 2007    Palliative Care Assessment & Plan   Patient Profile: Milayah Earl Havice is a 67 y.o. adult with medical history significant of CKD 5,diabetes, neuropathy, thrombocytopenia, OSA, osteomyelitis, diabetic foot infections, anemia, hypothyroidism, hypertension, hyperlipidemia, CAD, cirrhosis, asthma, spinal stenosis, status post lumbar fusion, PTSD, depression, anxiety, obesity, fibromyalgia, DVT, cataracts presenting with worsening back pain, nausea, vomiting. She presented to the hospital for back pain, abdominal pain, nausea, and vomiting and was admitted on 02/26/23.   Assessment: Patient reports 8/10 pain over the last day. When asked why she had only requested her prn oxycodone once she said she is trying not to take it. We discussed that she should use it to manage her pain so she can function. We have had this conversation before. I ordered a one time dose of 10mg  of oxycodone to try to get her pain down because she was not within the window for her prn. She reports her pain makes it hard for her to take deep breaths- I asked the RN to bring her an incentive spirometer.  She reports she has still not had a bowel movement. She reports that her bowel movements at home are not on a regular schedule either. I reinforced that with her increased opioid use it is very important to keep her bowel movements regular. She understands.  Recommendations/Plan: Full code / Full scope of care   Symptoms:   Chronic Pain Continue tylenol 1000 mg tid Continue diclofenac (Flector) 1.3% patch bid Continue lidocaine patch 5% 2 patches daily Continue Robaxin 750mg  tid Oxycontin 15mg  PO BID Oxycodone 5-10mg  PO Q4H PRN moderate/severe pain  Oxycodone  10mg  PO now one dose Continue duloxetine (Cymbalta) 60mg  daily Continue pregabalin (Lyrica) 75mg  daily Kpad to back as tolerated  Goals of Care and Additional Recommendations: Limitations on Scope of Treatment: Full Scope Treatment  Code Status:    Code Status Orders  (From admission, onward)           Start     Ordered   02/26/23 1448  Full code  Continuous       Question:  By:  Answer:  Consent: discussion documented in EHR   02/26/23 1449           Code Status History     Date Active Date Inactive Code Status Order ID Comments User Context   01/28/2023 2336 02/04/2023 1730 Full Code [id number]  John Giovanni, MD ED   06/21/2022 0025 07/11/2022 2159 Full Code [id number]  Carollee Herter, DO ED   01/07/2022 0511 01/09/2022 2045 Full Code [id number]  Angie Fava, DO Inpatient   10/17/2021 0420 10/22/2021 1928 Full Code [id number]  Chotiner, Claudean Severance, MD ED   02/18/2020 2241 02/26/2020 2334 Full Code [id number]  Lewie Chamber, MD Inpatient   01/29/2020 1704 02/18/2020 2044 Full Code [id number]  Charlton Amor, PA-C Inpatient   01/29/2020 1704 01/29/2020 1704 Full Code [id number]  Charlton Amor, PA-C Inpatient   01/22/2020 2141 01/29/2020 1650 Full Code [id number]  Bedelia Person, MD Inpatient   01/21/2020 0012 01/22/2020 2141 Full Code [id number]  Eduard Clos, MD Inpatient  11/15/2019 2200 11/18/2019 2226 Full Code [id number]  Pearson Grippe, MD ED   04/15/2019 2122 04/21/2019 2320 Full Code [id number]  Rometta Emery, MD Inpatient   04/06/2017 2004 04/11/2017 2109 Full Code [id number]  Alberteen Sam, MD Inpatient   09/29/2016 1831 10/02/2016 1643 Full Code [id number]  Nadara Mustard, MD Inpatient   03/19/2013 1507 03/21/2013 1812 Full Code [id number]  Nadara Mustard, MD Inpatient   02/10/2013 2124 02/20/2013 1859 Full Code [id number]  Nadara Mustard, MD Inpatient   02/03/2013 2137 02/10/2013 2124 Full Code [id number]  Cristal Ford, MD Inpatient   01/11/2012 0612 01/13/2012 1645 Full  Code [id number]  Wilder Glade, RN Inpatient       Prognosis:  Unable to determine  Discharge Planning: To Be Determined  Care plan was discussed with bedside RN and Dr. Janee Morn  Thank you for allowing the Palliative Medicine Team to assist in the care of this patient.  Time spent: 40 min   Detailed review of medical records ( labs, imaging, vital signs), medically appropriate exam, counseling and education to patient, documenting clinical information, medication management, coordination of care.   Sherryll Burger, NP  Please contact Palliative Medicine Team phone at [phone] for questions and concerns.

## 2023-03-27 NOTE — Progress Notes (Signed)
Physical Therapy Treatment Patient Details Name: [name] [name] MRN: [id number] DOB: 02-10-1956 Today's Date: 03/27/2023   History of Present Illness Pt is 67 year old who is moslty wheelchair bound and has CKD stage V, hypertension, cirrhosis of the liver, OSA, morbid obesity, L1 osteomyelitis status post L1 corpectomy, T10-T11 discitis osteomyelitis status post posterior arthrodesis of T8-T11 and removal of T11 pedicle screws and rod in 12/23.  Last month, diagnosed with T4-T5 discitis osteomyelitis which was being treated with daptomycin.  Neurosurgery did not recommend any further interventions.     The patient returns to the hospital for worsening back pain for [redacted] week along with an episode of vomiting.  Pt found to have AKI requiring 2 cycles of HD.  Also, with ongoing osteomyelitis/discitis Thoracic spine with T4-5 disc aspiration by IR on 5/3.  Neurosurgery recommends further medical management, no surgical intervention at this time.    PT Comments    Pt received in supine and agreeable to session with encouragement. Pt requiring increased time and cues for all mobility tasks this session due to increased fatigue and some confusion. Pt with decreased awareness, asking if the RW was the chair when holding onto it.  Pt perseverating on birth year, stating "[birth year]" and "[birth year]" throughout session, RN notified of confusion. Pt requiring increased assist for bed mobility to perform log roll technique and demonstrating increased weakness this session. Pt able to stand from EOB with min guard-min A and take a few steps, however demonstrates difficulty following commands for lateral steps.  Pt continues to benefit from PT services to progress toward functional mobility goals.     Recommendations for follow up therapy are one component of a multi-disciplinary discharge planning process, led by the attending physician.  Recommendations may be updated based on patient status, additional functional criteria and  insurance authorization.     Assistance Recommended at Discharge Intermittent Supervision/Assistance  Patient can return home with the following A little help with walking and/or transfers;A little help with bathing/dressing/bathroom;Assistance with cooking/housework;Help with stairs or ramp for entrance   Equipment Recommendations  None recommended by PT    Recommendations for Other Services       Precautions / Restrictions Precautions Precautions: Back Precaution Booklet Issued: No Restrictions Weight Bearing Restrictions: No     Mobility  Bed Mobility   Bed Mobility: Rolling, Sidelying to Sit, Sit to Sidelying Rolling: Mod assist Sidelying to sit: Max assist     Sit to sidelying: Max assist, +2 for physical assistance General bed mobility comments: Pt requiring mod-max A to perform logroll technique. Pt with difficulty pulling on bedrail with RUE to roll. Pt requiring max A +2 to return to supine due to increased pain and fatigue.    Transfers Overall transfer level: Needs assistance Equipment used: Rolling walker (2 wheels) Transfers: Sit to/from Stand Sit to Stand: Min guard, Min assist           General transfer comment: Pt able to stand from EOB x3 initially with min guard progressing to min A for the final trial. Attempted lateral steps towards the [facility], however pt beginning to walk forward and demonstrating difficulty following cues. Cues for safe hand placement       Balance Overall balance assessment: Needs assistance Sitting-balance support: Feet supported, Bilateral upper extremity supported Sitting balance-Leahy Scale: Fair Sitting balance - Comments: Pt with anterior lean at times requiring cues to correct   Standing balance support: During functional activity, Reliant on assistive device for balance, Bilateral upper extremity supported  Standing balance-Leahy Scale: Fair Standing balance comment: with RW support                             Cognition Arousal/Alertness: Awake/alert Behavior During Therapy: Flat affect, Anxious Overall Cognitive Status: Impaired/Different from baseline                                 General Comments: Pt perseverating on her birth year, stating "[birth year]" and then "[birth year]" when told the correct year was [birth year]. Pt alternated stating these numbers throughout the session. Pt stating "hold on" after each command. When told to step to the L side, pt began walking forward despite cues. Pt mumbling and difficult to understand at times.        Exercises General Exercises - Lower Extremity Long Arc Quad: AROM, Both, Seated, 5 reps    General Comments General comments (skin integrity, edema, etc.): Pt reporting increased fatigue and demonstrating more confusion this session, RN notified.      Pertinent Vitals/Pain Pain Assessment Pain Assessment: Faces Faces Pain Scale: Hurts even more Pain Location: back Pain Descriptors / Indicators: Grimacing, Guarding Pain Intervention(s): Limited activity within patient's tolerance, Monitored during session, Repositioned     PT Goals (current goals can now be found in the care plan section) Acute Rehab PT Goals Patient Stated Goal: return home PT Goal Formulation: With patient/family Time For Goal Achievement: 03/20/23 Potential to Achieve Goals: Good Progress towards PT goals: Progressing toward goals    Frequency    Min 3X/week      PT Plan Current plan remains appropriate       AM-PAC PT "6 Clicks" Mobility   Outcome Measure  Help needed turning from your back to your side while in a flat bed without using bedrails?: A Lot Help needed moving from lying on your back to sitting on the side of a flat bed without using bedrails?: A Lot Help needed moving to and from a bed to a chair (including a wheelchair)?: A Little Help needed standing up from a chair using your arms (e.g., wheelchair or bedside chair)?: A Little Help needed to  walk in hospital room?: A Little Help needed climbing 3-5 steps with a railing? : Total 6 Click Score: 14    End of Session Equipment Utilized During Treatment: Gait belt Activity Tolerance: Patient limited by pain;Patient limited by fatigue Patient left: in bed;with call bell/phone within reach;with bed alarm set Nurse Communication: Mobility status PT Visit Diagnosis: Other abnormalities of gait and mobility (R26.89);Muscle weakness (generalized) (M62.81);Pain     Time: 0102-7253 PT Time Calculation (min) (ACUTE ONLY): 28 min  Charges:  $Therapeutic Activity: 23-37 mins                     Johny Shock, PTA Acute Rehabilitation Services Secure Chat Preferred  [phone]    Johny Shock 03/27/2023, 3:12 PM

## 2023-03-27 NOTE — Progress Notes (Signed)
Patient refused her 10pm scheduled Miralax overnight. Educated med adm and clinical need, cont to decline, stated it makes her stomach ache. Patient also had been pushing the time  of  her soap sud enema after asking her to get it done last night,  she stated she want to sleep. She was asked again this am, she said it's too early. Report passed on to oncoming ( dayshift) RN.  Patient had voided 520 ml on the BSC.

## 2023-03-28 ENCOUNTER — Encounter: Admit: 2023-03-28 | Discharge: 2023-03-28 | Payer: MEDICARE

## 2023-03-28 DIAGNOSIS — Z992 Dependence on renal dialysis: Secondary | ICD-10-CM | POA: Diagnosis not present

## 2023-03-28 DIAGNOSIS — M4624 Osteomyelitis of vertebra, thoracic region: Secondary | ICD-10-CM | POA: Diagnosis not present

## 2023-03-28 DIAGNOSIS — N186 End stage renal disease: Secondary | ICD-10-CM | POA: Diagnosis not present

## 2023-03-28 DIAGNOSIS — G8929 Other chronic pain: Secondary | ICD-10-CM | POA: Diagnosis not present

## 2023-03-28 DIAGNOSIS — R509 Fever, unspecified: Secondary | ICD-10-CM | POA: Diagnosis not present

## 2023-03-28 DIAGNOSIS — M546 Pain in thoracic spine: Secondary | ICD-10-CM

## 2023-03-28 LAB — CBC
HCT: 24.6 % — ABNORMAL LOW (ref 36.0–46.0)
Hemoglobin: 7.8 g/dL — ABNORMAL LOW (ref 12.0–15.0)
MCH: 28.5 pg (ref 26.0–34.0)
MCHC: 31.7 g/dL (ref 30.0–36.0)
MCV: 89.8 fL (ref 80.0–100.0)
Platelets: 161 10*3/uL (ref 150–400)
RBC: 2.74 MIL/uL — ABNORMAL LOW (ref 3.87–5.11)
RDW: 17.8 % — ABNORMAL HIGH (ref 11.5–15.5)
WBC: 8.6 10*3/uL (ref 4.0–10.5)
nRBC: 0 % (ref 0.0–0.2)

## 2023-03-28 LAB — CULTURE, BLOOD (ROUTINE X 2): Culture: NO GROWTH

## 2023-03-28 LAB — RENAL FUNCTION PANEL
Albumin: 2.8 g/dL — ABNORMAL LOW (ref 3.5–5.0)
Anion gap: 12 (ref 5–15)
BUN: 43 mg/dL — ABNORMAL HIGH (ref 8–23)
CO2: 24 mmol/L (ref 22–32)
Calcium: 7.8 mg/dL — ABNORMAL LOW (ref 8.9–10.3)
Chloride: 91 mmol/L — ABNORMAL LOW (ref 98–111)
Creatinine, Ser: 4.72 mg/dL — ABNORMAL HIGH (ref 0.44–1.00)
GFR, Estimated: 10 mL/min — ABNORMAL LOW (ref >60)
Glucose, Bld: 139 mg/dL — ABNORMAL HIGH (ref 70–99)
Phosphorus: 4.9 mg/dL — ABNORMAL HIGH (ref 2.5–4.6)
Potassium: 3.7 mmol/L (ref 3.5–5.1)
Sodium: 127 mmol/L — ABNORMAL LOW (ref 135–145)

## 2023-03-28 LAB — C-REACTIVE PROTEIN: CRP: 18.5 mg/dL — ABNORMAL HIGH (ref <1.0)

## 2023-03-28 LAB — SEDIMENTATION RATE: Sed Rate: 82 mm/hr — ABNORMAL HIGH (ref 0–22)

## 2023-03-28 MED ORDER — PIOGLITAZONE 45 MG PO TAB
45 mg | ORAL_TABLET | Freq: Every day | ORAL | 1 refills | 90.00000 days | Status: AC
Start: 2023-03-28 — End: ?

## 2023-03-28 MED ORDER — ALBUMIN HUMAN 25 % IV SOLN
INTRAVENOUS | Status: AC
  Administered 2023-03-28: 25 g via INTRAVENOUS
  Filled 2023-03-28: qty 100

## 2023-03-28 MED ORDER — MIDODRINE HCL 5 MG PO TABS
10.0000 mg | ORAL_TABLET | ORAL | Status: DC
  Administered 2023-03-28: 10 mg via ORAL
  Filled 2023-03-28 (×2): qty 2

## 2023-03-28 MED ORDER — LORATADINE 10 MG PO TABS
10.0000 mg | ORAL_TABLET | Freq: Every day | ORAL | Status: DC
  Administered 2023-03-28 – 2023-04-01 (×5): 10 mg via ORAL
  Filled 2023-03-28 (×6): qty 1

## 2023-03-28 MED ORDER — ALBUMIN HUMAN 25 % IV SOLN
25.0000 g | Freq: Once | INTRAVENOUS | Status: AC
  Administered 2023-03-28: 25 g via INTRAVENOUS
  Filled 2023-03-28: qty 100

## 2023-03-28 NOTE — Progress Notes (Signed)
OT Cancellation Note  Patient Details Name: Rickisha Primack MRN: [id number] DOB: [birth year]   Cancelled Treatment:    Reason Eval/Treat Not Completed: Patient at procedure or test/ unavailable (pt at HD, OT will f/u with patient as able.)  03/28/2023  AB, OTR/L  Acute Rehabilitation Services  Office: [phone]   Tristan Schroeder 03/28/2023, 8:26 AM

## 2023-03-28 NOTE — Progress Notes (Signed)
[facility]  Date of Admission:  02/26/2023           Reason for visit: Follow up on discitis/osteomyelitis wrist with new fever  Current antibiotics: Daptomycin  ASSESSMENT:    67 y.o. adult admitted with:  Discitis/osteomyelitis: Patient with extensive history of infection in her back with known thoracolumbar discitis/osteomyelitis and instrumentation.  Previously, in March 2021 she was treated for thoracolumbar infection due to Pseudomonas. However, more recent cultures have yielded MRSE with T10-T11 discitis with erosion requiring revision and extension of fusion to T8 in August 2023.  Imaging in August 2023 also noted early discitis/OM of T4-5.  She was supposed to be on doxycycline suppression but did not tolerate this and subsequently stopped antibiotics after IV therapy in October 2023.  She now has progression of infection at T4-5 noted on MRI in March 2024 with aspiration again yielding MRSE and currently on a prolonged course of daptomycin initially with an anticipated end date of 03/30/23.  However, she has been readmitted with worsening back pain and MRI showing new osteomyelitis at T3 with vertebral collapse of T4 since her previous MRI in March.  She had repeat aspiration 03/02/23 which was negative.  Unclear if this MRI finding on 02/27/23 was new infection vs radiographic lag.  Nonetheless, her daptomycin was extended an additional 2 weeks through 04/13/23 by her primary ID provider, Dr Thedore Mins with plan for oral suppression thereafter. Acute on CKD stage V with progression to ESRD: Worsening renal function this admission now on HD via left [place] HD catheter (placed 03/12/23) while left AVF matures (placed 03/14/23). Obesity: Body mass index is 36.96 kg/m. Fevers: Unclear source of fevers that have developed x 2 on 5/24 and 5/28.  RECOMMENDATIONS:    Unclear cause of fevers with potential sources being her known discitis/OM, line infection, AVF site  infection, vs other etiology.  Doubt drug fever as fevers do not really coincide with dapto administration and she has been on this a long time Blood cultures 5/24 NGTD.  CXR last night unremarkable.  UA on 5/24 unremarkable. Her fistula site looks fine and her line sites also look okay.   I think my greatest concern would be her HD line is infected given the fevers are quite isolated.  Fever on 5/24 occurred shortly after HD session, but fever 5/28 was certainly further removed from her HD making this diagnosis less certain.  I wonder if her intradialytic hypotension is driven to some extent by stirring up some bacteria on her line during HD? Her back could be the source too, however, would expect worsening of her chronic pain +/- more consistent fever curve.  Will check ESR/CRP but would be hesitant to re-image her spine without new symptoms given the uncertainty of what to make of the radiographic changes that'll undoubtedly be seen Repeat another set of blood cultures.  She is on daptomycin so her cultures may be negative in spite of line infection Continue daptomycin for now Follow clinically, if fevers persist would have low threshold to coordinate a line holiday as best as possible Following    Principal Problem:   Acute osteomyelitis of thoracic spine (HCC) Active Problems:   Hyperlipidemia   Essential hypertension   Hepatic cirrhosis (HCC)   CAD (coronary artery disease)   Normocytic anemia   Obesity (BMI 30-39.9)   CKD (chronic kidney disease), stage III (HCC)   Diabetic peripheral neuropathy (HCC)   Type 2 diabetes mellitus with  hyperglycemia, with long-term current use of insulin (HCC)   Sleep apnea   Renal transplant, status post   Nausea & vomiting   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)   S/P lumbar fusion   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)   IDA (iron deficiency anemia)   PTSD (post-traumatic stress disorder)    Hypertension   High cholesterol   GERD (gastroesophageal reflux disease)   Fibromyalgia   Depression   Asthma   Anxiety   Anemia   Hypothyroidism   CKD (chronic kidney disease) stage 5, GFR less than 15 ml/min (HCC)   Osteomyelitis (HCC)   Chronic diastolic CHF (congestive heart failure) (HCC)   Thrombocytopenia (HCC)   ESRD on dialysis (HCC)   Fever    MEDICATIONS:    Scheduled Meds:  sodium chloride   Intravenous Once   sodium chloride   Intravenous Once   acetaminophen  1,000 mg Oral TID   acetaminophen  650 mg Oral Once   acidophilus  2 capsule Oral TID   apixaban  2.5 mg Oral BID   calcitRIOL  0.5 mcg Oral Daily   calcium carbonate  1 tablet Oral Daily   Chlorhexidine Gluconate Cloth  6 each Topical Q0600   darbepoetin (ARANESP) injection - NON-DIALYSIS  100 mcg Subcutaneous Q Fri-1800   diclofenac  1 patch Transdermal BID   DULoxetine  60 mg Oral Daily   feeding supplement (NEPRO CARB STEADY)  237 mL Oral TID BM   lidocaine  2 patch Transdermal Q24H   methocarbamol  750 mg Oral TID   midodrine  10 mg Oral Q M,W,F-HD   mometasone-formoterol  2 puff Inhalation BID   multivitamin  1 tablet Oral QHS   oxyCODONE  15 mg Oral Q12H   polyethylene glycol  17 g Oral BID   pregabalin  75 mg Oral Daily   senna-docusate  1 tablet Oral BID   sodium chloride flush  10-40 mL Intracatheter Q12H   sodium chloride flush  3 mL Intravenous Q12H   Continuous Infusions:  anticoagulant sodium citrate     DAPTOmycin (CUBICIN) 650 mg in sodium chloride 0.9 % IVPB Stopped (03/27/23 1649)   PRN Meds:.albuterol, alteplase, alteplase, anticoagulant sodium citrate, diazepam, heparin, heparin, heparin, hydrALAZINE, lidocaine (PF), lidocaine (PF), lidocaine-prilocaine, lidocaine-prilocaine, naLOXone (NARCAN)  injection, nitroGLYCERIN, ondansetron (ZOFRAN) IV, mouth rinse, oxyCODONE, oxyCODONE, pentafluoroprop-tetrafluoroeth, pentafluoroprop-tetrafluoroeth  SUBJECTIVE:   24 hour events:   Reconsulted this afternoon due to fever Patient has been hospitalized since 02/27/2023 due to discitis/osteomyelitis currently on daptomycin planned through 04/13/2023 followed by antibiotics for chronic suppression Her hospital course has been, acute and high chronic kidney disease with progression to ESRD She had a temporary left subclavian HD line placed 5/13 She also has a tunneled right IJ CVC placed 4/5 She had a left brachiocephalic AV fistula placed 5/15 She has been getting ongoing dialysis during her admission.  She has been awaiting placement.  She did fever of 103 F on 5/24 3 PM after receiving dialysis that morning.  Blood cultures were obtained and no growth. She developed another fever last night 5/28 at at 8:41 PM after getting dialysis in the morning on 5/27. She reports ongoing back pain but nothing that is worsening relatively again.  She had a chest x-ray yesterday at the time of her fever showing some vascular congestion but no evidence of infection.  Her O2 sats are stable. Additionally, there has been episodes of intradialytic hypotension requiring albumin and  midodrine.  Her blood pressure sure to a was noted to be as low as 69/46.   Review of Systems  All other systems reviewed and are negative.     OBJECTIVE:   Blood pressure (!) 110/54, pulse 82, temperature 97.7 F (36.5 C), resp. rate 15, height 4\' 11"  (1.499 m), weight 83 kg, SpO2 95 %. Body mass index is 36.96 kg/m.  Physical Exam Constitutional:      Comments: Patient seen at the end of her dialysis session.  She is alert and oriented.  She is chronically ill-appearing.  HENT:     Head: Normocephalic and atraumatic.  Eyes:     Extraocular Movements: Extraocular movements intact.     Conjunctiva/sclera: Conjunctivae normal.  Cardiovascular:     Comments: Right IJ CVC with no erythema, tenderness. Left HD subclavian line appears normal Pulmonary:     Effort: Pulmonary effort is normal. No respiratory  distress.     Breath sounds: Normal breath sounds.  Musculoskeletal:     Cervical back: Normal range of motion and neck supple.     Right lower leg: No edema.     Left lower leg: No edema.     Comments: Left upper extremity AV fistula incision clean and dry with no drainage, no swelling, no tenderness  Skin:    General: Skin is warm and dry.     Findings: No rash.  Neurological:     General: No focal deficit present.     Mental Status: She is oriented to person, place, and time.  Psychiatric:        Mood and Affect: Mood normal.        Behavior: Behavior normal.      Lab Results: Lab Results  Component Value Date   WBC 8.6 03/28/2023   HGB 7.8 (L) 03/28/2023   HCT 24.6 (L) 03/28/2023   MCV 89.8 03/28/2023   PLT 161 03/28/2023    Lab Results  Component Value Date   NA 127 (L) 03/28/2023   K 3.7 03/28/2023   CO2 24 03/28/2023   GLUCOSE 139 (H) 03/28/2023   BUN 43 (H) 03/28/2023   CREATININE 4.72 (H) 03/28/2023   CALCIUM 7.8 (L) 03/28/2023   GFRNONAA 10 (L) 03/28/2023   GFRAA 31 (L) 06/16/2020    Lab Results  Component Value Date   ALT 9 03/19/2023   AST 14 (L) 03/19/2023   ALKPHOS 118 03/19/2023   BILITOT 1.2 03/19/2023       Component Value Date/Time   CRP 1.0 (H) 02/27/2023 0747       Component Value Date/Time   ESRSEDRATE 74 (H) 02/27/2023 0747     I have reviewed the micro and lab results in Epic.  Imaging: DG CHEST PORT 1 VIEW  Result Date: 03/27/2023 CLINICAL DATA:  Shortness of breath EXAM: PORTABLE CHEST 1 VIEW COMPARISON:  03/23/2023 FINDINGS: Cardiac shadow is enlarged but stable. Left-sided dialysis catheter and right-sided tunneled PICC are seen. Postsurgical changes in the thoracolumbar spine are noted. Lungs are hypoinflated without focal confluent infiltrate. Increased central vascular congestion is noted. IMPRESSION: Increased central vascular congestion consistent with mild CHF. Tubes and lines as described. Electronically Signed   By:  Alcide Clever M.D.   On: 03/27/2023 21:58     Imaging independently reviewed in Epic.    Vedia Coffer for Infectious Disease [facility] [phone] pager 03/28/2023, 1:05 PM  I have personally spent 50 minutes involved in face-to-face and non-face-to-face activities for  this patient on the day of the visit. Professional time spent includes the following activities: Preparing to see the patient (review of tests), Obtaining and/or reviewing separately obtained history (admission/discharge record), Performing a medically appropriate examination and/or evaluation , Ordering medications/tests/procedures, referring and communicating with other health care professionals, Documenting clinical information in the EMR, Independently interpreting results (not separately reported), Communicating results to the patient/family/caregiver, Counseling and educating the patient/family/caregiver and Care coordination (not separately reported).

## 2023-03-28 NOTE — Procedures (Signed)
Patient was seen on dialysis and the procedure was supervised.  BFR 400  Via TDC BP is  99/53.   Patient appears to be tolerating treatment well-  on midodrine for intradyalytic hypotension   Cecille Aver 03/28/2023

## 2023-03-28 NOTE — Progress Notes (Signed)
   03/28/23 1235  Vitals  Temp 97.7 F (36.5 C)  Pulse Rate 82  Resp 15  BP (!) 110/54  SpO2 95 %  Post Treatment  Dialyzer Clearance Clear  Duration of HD Treatment -hour(s) 3.5 hour(s)  Hemodialysis Intake (mL) 0 mL  Liters Processed 89.8  Fluid Removed (mL) 3500 mL  Tolerated HD Treatment Yes   Received patient in bed to unit.  Alert and oriented.  Informed consent signed and in chart.   TX duration:3.75hr  Patient tolerated well.  Transported back to the room  Alert, without acute distress.  Hand-off given to patient's nurse.   Access used: [facility] Access issues: none  Total UF removed: 3.5L Medication(s) given: albumin 25G x 2    [facility]

## 2023-03-28 NOTE — Progress Notes (Signed)
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 TDC placement 03/12/23, appreciate assistance - CLIP to AF MWF 1145 (arrive 1115am 1st day) - Appreciate lt BCF by Dr. Chestine Spore on 5/15 -> positive thrill -s/p HD on 5/27, had intradialytic hypotension required albumin and midodrine.  Plan for next HD on 5/29. - Continue MWF schedule.  Based on what I saw yesterday I cannot see where she could go home-  her mobility seems terrible-  will also need to see if can sit up in chair for HD - not sure she is a candidate for OP dialysis in this shape.  Having PT to see and will attempt HD in chair on Friday   # Anemia: Continue ESA and monitor hemoglobin.  Required blood transfusion..iron was OK.   Hgb low but stable  # CKD-  Has not needed binder. Continue calcitriol for secondary hyperparathyroidism.  # HTN/volume:   Continue midodrine pre-HD for intradialytic hypotension.  Manage volume with HD.  She has pitting edema-  UF as able. BP other than in HD is OK -  will change midodrine to pre HD only   # Osteomyelitis plan on daptomycin.  Last dose on 6/14. Ok to discharge from renal perspective.  # Hyponatremia, hypervolemic: Managed with dialysis.   fluid restriction.  # Acute febrile illness: Chest x-ray negative.  On antibiotics for OM.  Blood culture negative so far.  Afebrile today.      # Hypokalemia:  managed with higher potassium bath.  Subjective:  seen in HD -  is more calm-  I see that PT saw her-  max assist in bed mobility.  She asks for water and has me hold it up to her mouth.  Will be going home with husband  Objective Vital signs in last 24 hours: Vitals:   03/28/23 0845 03/28/23 0900 03/28/23 0930 03/28/23 0938  BP: 118/63 (!) 115/59 (S) (!) 86/40 (S) (!) 99/53  Pulse: 68 78 71 76  Resp: 11 10 12 12   Temp:      TempSrc:      SpO2: 94% 95% 96% 98%  Weight:      Height:       Weight change: -3.7  kg  Intake/Output Summary (Last 24 hours) at 03/28/2023 0949 Last data filed at 03/28/2023 0223 Gross per 24 hour  Intake 223 ml  Output --  Net 223 ml       Labs: RENAL PANEL Recent Labs  Lab 03/24/23 0306 03/25/23 0321 03/26/23 0502 03/27/23 0400 03/28/23 0314  NA 129* 127* 127* 128* 127*  K 3.5 3.2* 3.8 3.6 3.7  CL 93* 91* 92* 90* 91*  CO2 26 26 23 26 24   GLUCOSE 117* 82 75 62* 139*  BUN 19 30* 43* 28* 43*  CREATININE 2.30* 3.14* 3.96* 3.47* 4.72*  CALCIUM 7.7* 7.7* 7.6* 7.2* 7.8*  PHOS 4.2 3.7 4.4 4.0 4.9*  ALBUMIN 2.6* 2.5* 2.5* 2.8* 2.8*    Liver Function Tests: Recent Labs  Lab 03/26/23 0502 03/27/23 0400 03/28/23 0314  ALBUMIN 2.5* 2.8* 2.8*   No results for input(s): "LIPASE", "AMYLASE" in the last 168 hours. No results for input(s): "AMMONIA" in the last 168 hours. CBC: Recent Labs    07/26/22 1258 08/09/22 1343 09/06/22 1325 09/06/22 1326 09/07/22 1433 10/10/22 1308 11/08/22 1243 11/15/22 1334 12/06/22 1326 12/06/22 1327 12/13/22 1424 01/12/23 1419 01/28/23 1539 03/02/23 0402 03/02/23 1558 03/24/23 0306 03/25/23 0321 03/26/23 0950  03/26/23 1620 03/28/23 0314  HGB 8.0*   < > 10.0*  --  10.5*   < > 9.9*   < > 8.9*  --    < > 8.3*   < > 7.6*   < > 8.1* 7.9* 7.7* 8.4* 7.8*  MCV 90.7   < > 88.7  --  86.7   < > 91.2   < > 95.5  --    < > 93.3   < > 91.5   < > 90.3 88.4 89.1 91.3 89.8  VITAMINB12  --   --   --   --  273  --   --   --   --   --   --   --   --   --   --   --   --   --   --   --   FERRITIN 394*  --   --  401*  --   --  258  --   --  209  --  277  --   --   --   --   --   --   --   --   TIBC 232*  --  258  --   --   --  256  --  269  --   --  263  --  217*  --   --   --   --   --   --   IRON 64  --  69  --   --   --  53  --  46  --   --  32  --  70  --   --   --   --   --   --   RETICCTPCT 2.2  --   --  1.4  --   --  2.1  --   --  2.1  --  2.6  --   --   --   --   --   --   --   --    < > = values in this interval not displayed.     Cardiac Enzymes: Recent Labs  Lab 03/27/23 0400  CKTOTAL 70   CBG: Recent Labs  Lab 03/23/23 0748 03/23/23 1614  GLUCAP 103* 133*    Iron Studies: No results for input(s): "IRON", "TIBC", "TRANSFERRIN", "FERRITIN" in the last 72 hours. Studies/Results: DG CHEST PORT 1 VIEW  Result Date: 03/27/2023 CLINICAL DATA:  Shortness of breath EXAM: PORTABLE CHEST 1 VIEW COMPARISON:  03/23/2023 FINDINGS: Cardiac shadow is enlarged but stable. Left-sided dialysis catheter and right-sided tunneled PICC are seen. Postsurgical changes in the thoracolumbar spine are noted. Lungs are hypoinflated without focal confluent infiltrate. Increased central vascular congestion is noted. IMPRESSION: Increased central vascular congestion consistent with mild CHF. Tubes and lines as described. Electronically Signed   By: Alcide Clever M.D.   On: 03/27/2023 21:58    Medications: Infusions:  anticoagulant sodium citrate     DAPTOmycin (CUBICIN) 650 mg in sodium chloride 0.9 % IVPB Stopped (03/27/23 1649)    Scheduled Medications:  sodium chloride   Intravenous Once   sodium chloride   Intravenous Once   acetaminophen  1,000 mg Oral TID   acetaminophen  650 mg Oral Once   acidophilus  2 capsule Oral TID   apixaban  2.5 mg Oral BID   calcitRIOL  0.5 mcg Oral Daily   calcium carbonate  1 tablet Oral Daily  Chlorhexidine Gluconate Cloth  6 each Topical Q0600   darbepoetin (ARANESP) injection - NON-DIALYSIS  100 mcg Subcutaneous Q Fri-1800   diclofenac  1 patch Transdermal BID   DULoxetine  60 mg Oral Daily   feeding supplement (NEPRO CARB STEADY)  237 mL Oral TID BM   lidocaine  2 patch Transdermal Q24H   methocarbamol  750 mg Oral TID   midodrine  10 mg Oral TID WC   mometasone-formoterol  2 puff Inhalation BID   multivitamin  1 tablet Oral QHS   oxyCODONE  15 mg Oral Q12H   polyethylene glycol  17 g Oral BID   pregabalin  75 mg Oral Daily   senna-docusate  1 tablet Oral BID   sodium chloride  flush  10-40 mL Intracatheter Q12H   sodium chloride flush  3 mL Intravenous Q12H    have reviewed scheduled and prn medications.  Physical Exam: General: more conversive today  Heart:RRR, s1s2 nl Lungs:clear b/l, no crackle Abdomen:soft, Non-tender, non-distended Extremities:  pitting edema Dialysis Access: LIJ TDC, L BCF + T/B    Cherrell Maybee A Karrington Studnicka 03/28/2023,9:49 AM  LOS: 29 days

## 2023-03-28 NOTE — Progress Notes (Signed)
PROGRESS NOTE   [name] [name]  DGU:[id number]    DOB: 1956/10/15    DOA: 02/26/2023  PCP: Donato Schultz, DO   I have briefly reviewed patients previous medical records in [facility].  Chief Complaint  Patient presents with   Abdominal Pain   Back Pain    Brief Narrative:   67 year old who is moslty wheelchair bound with PMH of CKD stage V, hypertension, cirrhosis of the liver, OSA, morbid obesity, type II DM with neuropathy, hypothyroidism, CAD, PTSD/depression, L1 osteomyelitis status post L1 corpectomy, T10-T11 discitis osteomyelitis status post posterior arthrodesis of T8-T11 and removal of T11 pedicle screws and rod in 12/23, in April 2024, diagnosed with T4-T5 discitis osteomyelitis which was being treated with daptomycin, Neurosurgery did not recommend any further interventions, presented to the hospital on 02/26/2023 for worsening back pain.  This admission diagnosed with recurrent discitis/osteomyelitis, initial acute on chronic kidney disease which has since progressed to new ESRD and on HD.  Due to ongoing intermittent high fevers, ID was reconsulted on 5/29.  Nephrology following for HD needs.  Palliative care consulted for pain management.  5/15, patient had a fall in the OR while being transferred from the OR table.  For details of hospital course, please refer to prior IM provider notes including progress note by Dr. Ramiro Harvest from 03/27/2023.    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  She has had extensive history of discitis/osteomyelitis, surgical intervention, antibiotic treatment etc. prior to this admission.  Please refer to ID follow-up progress note from 03/28/2023 for details. MRI T-spine 4/30 showed persistent T4-T5 discitis/osteomyelitis with mild interval collapse of the T4 vertebral body.  Also had interval increase in size of suspected dorsal epidural fluid collection measuring 7 mm, possibly abscess or phlegmon. Blood cultures x 2 from 5/24 & 5/29: Negative.  Fine-needle aspirate culture from intervertebral disc T4-5 from 5/3: Negative.  MRSA PCR negative. On IV daptomycin since 4/30. Noted ongoing fevers up to 103 F yesterday.  Thereby consulted ID again and their input much appreciated.  Etiology of these fevers unclear but has multiple source possibilities as per their note.  Chest x-ray 5/28 unremarkable.  UA not indicative of UTI.  Fistula site does not appear infected.  Checking ESR/CRP, repeating  another set of blood cultures, continuing daptomycin for now and following clinically.  If fevers persist then recommend considering a line holiday.  New ESRD Initially presented with acute kidney injury complicating stage V CKD which has since progressed to ESRD.  Nephrology following. TDC  placed 03/12/2023 & AVF 03/14/2023 by vascular surgery. Undergoing HD on MWF schedule.  Has been clipped to outpatient HD. See concerns regarding fever and line infection as noted above. As per discussion with TOC, not appropriate for SNF.  Discussed with nephrology regarding attempting to sit up in chair for next HD.  Acute on chronic pain: Due to recurrent osteomyelitis.  PTA, patient noted to be on Norco, Valium and Lyrica. Palliative care consulted for symptom management. Pain appears to be reasonably controlled on current multimodality pain regimen which includes OxyContin, as needed oxycodone and others. Outpatient follow-up with PCP/referral to pain clinic by PCP.  Controlled 2 DM with ESRD: A1c 4.6 and 4.7 this admission.  Early on admission had hypoglycemic episodes.  Off all insulins.  Also legally blind in both eyes, left greater than sign right.  Diarrhea/constipation: Workup revealed C. difficile antigen positive but toxigenic PCR negative.  Got Questran.  Then had constipation treated with Dulcolax suppository, MiraLAX, sorbitol, soapsuds enemas.  Continue bowel regimen.  Improved.  Urinary retention: Required Foley catheter earlier on in hospitalization.  Foley catheter eventually removed on 5/20.  Voiding without difficulty.  Normocytic anemia: Likely multifactorial due to anemia of chronic disease, ESRD.  S/p multiple PRBC transfusions this admission.  Has received IV iron. Continue ESA and monitor CBCs.  Transfuse for hemoglobin 7 g or less.  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  Stable.  No bleeding reported.  Chronic diastolic CHF: Volume management across dialysis.  Lasix continued.  Euvolemic.  Hypertension/hypotension: Continue midodrine pre-HD for intradialytic hypotension.  Depression/anxiety:  Continue Cymbalta/diazepam.  Chronic lower extremity DVTs: On Eliquis.  Fall in the OR: Sustained during transfer from the OR table to the stretcher.   CT C/A/P with no acute trauma.  CT head negative.  Plain films of left shoulder negative for any fracture.  Hyponatremia, hypervolemic:  Managed with dialysis.   fluid restriction.   Hypokalemia:   managed with higher potassium bath.  Chest pain/dyspnea Noted 5/24.  Some EKG changes.  Cardiac enzymes flat.  Chest x-ray with no acute infiltrate.  TTE showed normal EF with no wall motion abnormalities and grade 1 diastolic dysfunction.  Chest pain resolved.  Atypical chest pain, suspect musculoskeletal.   Body mass index is 36.96 kg/m.  Nutritional Status Nutrition Problem: Increased nutrient needs Etiology: acute illness, catabolic illness (acute OM of thoracic spine, new ESRD requiring HD) Signs/Symptoms: estimated needs Interventions: MVI, Nepro shake, Education  Pressure Ulcer: Pressure Injury 02/26/23 Heel Left (Active)  02/26/23 1826  Location: Heel  Location Orientation: Left  Staging:   Wound Description (Comments):   Present on Admission: Yes  Dressing Type Compression wrap 03/28/23 0800  Continue current wound care as per [facility] RN recommendations.  ACP Documents: None present DVT prophylaxis: apixaban (ELIQUIS) tablet 2.5 mg Start: 03/15/23 1145 Place and maintain sequential compression device Start: 03/01/23 1536     Code Status: Full Code:  Family Communication: None at bedside Disposition:  Status is: Inpatient Remains inpatient appropriate because: Needs to be able to set up for HD.  Awaiting PT reassessment.     Consultants:   ID: Dr. [name] 02/10/2023 Wound care RN IR: Dr.De Melchor Amour 02/28/2023 Nephrology: Dr. Glenna Fellows 03/01/2023 Vascular surgery: Dr.  Randie Heinz 03/11/2023 Palliative care: Dr. Linna Darner 03/13/2023  Procedures:   Placement of tunneled left-sided hemodialysis catheter: Per IR: Dr. Fredia Sorrow 03/12/2023 Placement of left jugular nontunneled dialysis catheter using ultrasound and fluoroscopic guidance: Per IR, Dr. Lowella Dandy 03/02/2023 Fluoroscopy guided T5  core biopsy and T4-5 disc aspiration: Per Dr.Katyucia, IR, 03/02/2023 Placement of left jugular type lumen dialysis catheter: Per IR, Dr. Lowella Dandy 03/02/2023 Left brachiocephalic AV fistula placement: Per vascular surgery: Dr. Chestine Spore 03/14/2023 Ultrasound-guided access left internal jugular vein, placement of left internal jugular vein tunneled dialysis catheter: Per Dr. Chestine Spore 03/12/2023  Antimicrobials:   As noted above.   Subjective:  Seen this morning at HD.  She remembers me caring for her a few weeks ago.  Denies specific complaints.  Cannot recall that she had a fever yesterday afternoon.  Denies cough, dyspnea, nausea, vomiting or diarrhea.  Indicates that her back pain is controlled.  Objective:   Vitals:   03/28/23 1215 03/28/23 1230 03/28/23 1235 03/28/23 1308  BP: (!) 100/59 (!) 100/56 (!) 110/54 (!) 158/101  Pulse: 73 81 82 83  Resp: 10 15 15 17   Temp:   97.7 F (36.5 C) 98.1 F (36.7 C)  TempSrc:    Oral  SpO2: 100% 100% 95% 94%  Weight:      Height:        General exam: Middle-age female, small built and frail, lying supine in bed and undergoing HD. Eyes: Legally blind in both eyes. Respiratory system: Clear to auscultation. Respiratory effort normal. Cardiovascular system: S1 & S2 heard, RRR. No JVD, murmurs, rubs, gallops or clicks. No pedal edema. Gastrointestinal system: Abdomen is nondistended, soft and nontender. No organomegaly or masses felt. Normal bowel sounds heard. Central nervous system: Alert and oriented. No focal neurological deficits. Extremities: Symmetric 5 x 5 power. Skin: No rashes, lesions or ulcers Psychiatry: Judgement and insight appear normal. Mood & affect appropriate.     Data Reviewed:   I have personally reviewed following labs and imaging studies   CBC: Recent Labs  Lab 03/23/23 1635 03/24/23 0306 03/25/23 0321 03/26/23 0950 03/26/23 1620 03/28/23 0314  WBC 3.5* 3.4*   < > 3.7* 4.8 8.6  NEUTROABS 3.2 2.4  --  2.7  --   --   HGB  8.5* 8.1*   < > 7.7* 8.4* 7.8*  HCT 26.2* 25.2*   < > 23.7* 26.2* 24.6*  MCV 88.8 90.3   < > 89.1 91.3 89.8  PLT 91* 103*   < > 102* 104* 161   < > = values in this interval not displayed.    Basic Metabolic Panel: Recent Labs  Lab 03/24/23 0306 03/25/23 0321 03/26/23 0502 03/27/23 0400 03/28/23 0314  NA 129* 127* 127* 128* 127*  K 3.5 3.2* 3.8 3.6 3.7  CL 93* 91* 92* 90* 91*  CO2 26 26 23 26 24   GLUCOSE 117* 82 75 62* 139*  BUN 19 30* 43* 28* 43*  CREATININE 2.30* 3.14* 3.96* 3.47* 4.72*  CALCIUM 7.7* 7.7* 7.6* 7.2* 7.8*  PHOS 4.2 3.7 4.4 4.0 4.9*    Liver Function Tests: Recent Labs  Lab 03/24/23 0306 03/25/23 0321 03/26/23 0502 03/27/23 0400 03/28/23 0314  ALBUMIN 2.6* 2.5* 2.5* 2.8* 2.8*    CBG: Recent Labs  Lab 03/23/23 0748 03/23/23 1614  GLUCAP 103* 133*    Microbiology Studies:   Recent Results (from the past 240 hour(s))  Culture, blood (Routine X 2) w Reflex to ID Panel     Status: None  Collection Time: 03/23/23  4:22 PM   Specimen: BLOOD  Result Value Ref Range Status   Specimen Description BLOOD RIGHT ANTECUBITAL  Final   Special Requests   Final    BOTTLES DRAWN AEROBIC AND ANAEROBIC Blood Culture adequate volume   Culture   Final    NO GROWTH 5 DAYS Performed at [facility], [address] [place], [place] [address]    Report Status 03/28/2023 FINAL  Final  Culture, blood (Routine X 2) w Reflex to ID Panel     Status: None   Collection Time: 03/23/23  4:52 PM   Specimen: BLOOD RIGHT HAND  Result Value Ref Range Status   Specimen Description BLOOD RIGHT HAND  Final   Special Requests   Final    BOTTLES DRAWN AEROBIC ONLY Blood Culture adequate volume   Culture   Final    NO GROWTH 5 DAYS Performed at [facility], [address], [place], [place] [address]    Report Status 03/28/2023 FINAL  Final    Radiology Studies:  DG CHEST PORT 1 VIEW  Result Date: 03/27/2023 CLINICAL DATA:  Shortness of breath EXAM:  PORTABLE CHEST 1 VIEW COMPARISON:  03/23/2023 FINDINGS: Cardiac shadow is enlarged but stable. Left-sided dialysis catheter and right-sided tunneled PICC are seen. Postsurgical changes in the thoracolumbar spine are noted. Lungs are hypoinflated without focal confluent infiltrate. Increased central vascular congestion is noted. IMPRESSION: Increased central vascular congestion consistent with mild CHF. Tubes and lines as described. Electronically Signed   By: Alcide Clever M.D.   On: 03/27/2023 21:58    Scheduled Meds:    sodium chloride   Intravenous Once   sodium chloride   Intravenous Once   acetaminophen  1,000 mg Oral TID   acetaminophen  650 mg Oral Once   acidophilus  2 capsule Oral TID   apixaban  2.5 mg Oral BID   calcitRIOL  0.5 mcg Oral Daily   calcium carbonate  1 tablet Oral Daily   Chlorhexidine Gluconate Cloth  6 each Topical Q0600   darbepoetin (ARANESP) injection - NON-DIALYSIS  100 mcg Subcutaneous Q Fri-1800   diclofenac  1 patch Transdermal BID   DULoxetine  60 mg Oral Daily   feeding supplement (NEPRO CARB STEADY)  237 mL Oral TID BM   lidocaine  2 patch Transdermal Q24H   methocarbamol  750 mg Oral TID   midodrine  10 mg Oral Q M,W,F-HD   mometasone-formoterol  2 puff Inhalation BID   multivitamin  1 tablet Oral QHS   oxyCODONE  15 mg Oral Q12H   polyethylene glycol  17 g Oral BID   pregabalin  75 mg Oral Daily   senna-docusate  1 tablet Oral BID   sodium chloride flush  10-40 mL Intracatheter Q12H   sodium chloride flush  3 mL Intravenous Q12H    Continuous Infusions:    DAPTOmycin (CUBICIN) 650 mg in sodium chloride 0.9 % IVPB Stopped (03/27/23 1649)     LOS: 29 days     Marcellus Scott, MD,  FACP, FHM, SFHM, [facility], [facility]   [facility] [place]     To contact the attending provider between 7A-7P or the covering provider during after hours 7P-7A, please log into the web site [email or web address] and access using  universal [place] password for that web site. If you do not have the password, please call the hospital operator.  03/28/2023, 2:20 PM

## 2023-03-28 NOTE — Progress Notes (Signed)
PT Cancellation Note  Patient Details Name: [name] [name] MRN: [id number] DOB: 1956/08/07   Cancelled Treatment:    Reason Eval/Treat Not Completed: Patient at procedure or test/unavailable.  In HD, retry at another time.   Ivar Drape 03/28/2023, 12:21 PM  Samul Dada, PT PhD Acute Rehab Dept. Number: [facility] [id number] and [facility] [phone]

## 2023-03-28 NOTE — Progress Notes (Signed)
OT Cancellation Note  Patient Details Name: [name] [name] MRN: [id number] DOB: November 29, 1955   Cancelled Treatment:    Reason Eval/Treat Not Completed: Patient declined, no reason specified (Pt reports not wanting to engage in OT session, too lethargic from HD. Pt informed of rehab cancellation policy. Pt verbalized understanding. Reconsult OT if needed.)  03/28/2023  AB, OTR/L  Acute Rehabilitation Services  Office: [phone]   Tristan Schroeder 03/28/2023, 4:47 PM

## 2023-03-29 ENCOUNTER — Telehealth: Payer: Self-pay | Admitting: *Deleted

## 2023-03-29 DIAGNOSIS — N186 End stage renal disease: Secondary | ICD-10-CM | POA: Diagnosis not present

## 2023-03-29 DIAGNOSIS — R509 Fever, unspecified: Secondary | ICD-10-CM | POA: Diagnosis not present

## 2023-03-29 DIAGNOSIS — M4624 Osteomyelitis of vertebra, thoracic region: Secondary | ICD-10-CM | POA: Diagnosis not present

## 2023-03-29 DIAGNOSIS — Z992 Dependence on renal dialysis: Secondary | ICD-10-CM | POA: Diagnosis not present

## 2023-03-29 LAB — RENAL FUNCTION PANEL
Albumin: 3.4 g/dL — ABNORMAL LOW (ref 3.5–5.0)
Albumin: 3.4 g/dL — ABNORMAL LOW (ref 3.5–5.0)
Anion gap: 13 (ref 5–15)
Anion gap: 13 (ref 5–15)
BUN: 25 mg/dL — ABNORMAL HIGH (ref 8–23)
BUN: 30 mg/dL — ABNORMAL HIGH (ref 8–23)
CO2: 27 mmol/L (ref 22–32)
CO2: 26 mmol/L (ref 22–32)
Calcium: 8.4 mg/dL — ABNORMAL LOW (ref 8.9–10.3)
Calcium: 8.6 mg/dL — ABNORMAL LOW (ref 8.9–10.3)
Chloride: 90 mmol/L — ABNORMAL LOW (ref 98–111)
Chloride: 90 mmol/L — ABNORMAL LOW (ref 98–111)
Creatinine, Ser: 3.69 mg/dL — ABNORMAL HIGH (ref 0.44–1.00)
Creatinine, Ser: 4.36 mg/dL — ABNORMAL HIGH (ref 0.44–1.00)
GFR, Estimated: 13 mL/min — ABNORMAL LOW (ref >60)
GFR, Estimated: 11 mL/min — ABNORMAL LOW (ref >60)
Glucose, Bld: 101 mg/dL — ABNORMAL HIGH (ref 70–99)
Glucose, Bld: 75 mg/dL (ref 70–99)
Phosphorus: 3.9 mg/dL (ref 2.5–4.6)
Phosphorus: 4.1 mg/dL (ref 2.5–4.6)
Potassium: 3.4 mmol/L — ABNORMAL LOW (ref 3.5–5.1)
Potassium: 3.6 mmol/L (ref 3.5–5.1)
Sodium: 130 mmol/L — ABNORMAL LOW (ref 135–145)
Sodium: 129 mmol/L — ABNORMAL LOW (ref 135–145)

## 2023-03-29 LAB — GLUCOSE, CAPILLARY
Glucose-Capillary: 91 mg/dL (ref 70–99)
Glucose-Capillary: 74 mg/dL (ref 70–99)

## 2023-03-29 LAB — CBC
HCT: 24.3 % — ABNORMAL LOW (ref 36.0–46.0)
Hemoglobin: 7.6 g/dL — ABNORMAL LOW (ref 12.0–15.0)
MCH: 28.3 pg (ref 26.0–34.0)
MCHC: 31.3 g/dL (ref 30.0–36.0)
MCV: 90.3 fL (ref 80.0–100.0)
Platelets: 190 10*3/uL (ref 150–400)
RBC: 2.69 MIL/uL — ABNORMAL LOW (ref 3.87–5.11)
RDW: 17.8 % — ABNORMAL HIGH (ref 11.5–15.5)
WBC: 6.8 10*3/uL (ref 4.0–10.5)
nRBC: 0 % (ref 0.0–0.2)

## 2023-03-29 LAB — CULTURE, BLOOD (ROUTINE X 2)

## 2023-03-29 LAB — BLOOD GAS, VENOUS
Acid-Base Excess: 2.3 mmol/L — ABNORMAL HIGH (ref 0.0–2.0)
Bicarbonate: 29.5 mmol/L — ABNORMAL HIGH (ref 20.0–28.0)
O2 Saturation: 37.7 %
Patient temperature: 36.7
pCO2, Ven: 55 mmHg (ref 44–60)
pH, Ven: 7.33 (ref 7.25–7.43)
pO2, Ven: <31 mmHg — CL (ref 32–45)

## 2023-03-29 MED ORDER — CHLORHEXIDINE GLUCONATE CLOTH 2 % EX PADS
6.0000 | MEDICATED_PAD | Freq: Every day | CUTANEOUS | Status: DC
  Administered 2023-03-30 – 2023-04-01 (×3): 6 via TOPICAL

## 2023-03-29 MED ORDER — OXYCODONE HCL 5 MG PO TABS
5.0000 mg | ORAL_TABLET | Freq: Three times a day (TID) | ORAL | Status: DC | PRN
  Administered 2023-04-04 (×2): 5 mg via ORAL
  Filled 2023-03-29 (×2): qty 1

## 2023-03-29 NOTE — Significant Event (Signed)
Rapid Response Event Note   Reason for Call :  Difficult arousing pt.  Per RN, pt was found using accessory muscles to breathe and was very difficult to arouse. RN about to get pt to wake up with sternal rub.  Initial Focused Assessment:  Pt lying in bed with eyes open, in no visible distress. She denies CP/SOB. She does c/o some dizziness. She alert to self and year, but confused to everything else. She is able to follow commands with all extremities. Pupils 5 and brisk. Lungs diminished t/o. ABG large, distended, soft. Skin warm and dry. L arm more swollen than R, pulses palpable. She has a tremor to both hands-per family this is not new.   T-98.4, HR-89, BP-151/66, RR-20, SpO2-96% on 2L [place].   Interventions:  CBG-74  Plan of Care:  Pt is now back to baseline and in no visible distress, VSS. Confusion/drowsiness started this afternoon and MD is aware. Please call RRT if further assistance needed.   Event Summary:   MD Notified:  Call Time:2205  Arrival Time:2209 End Time:2230  Terrilyn Saver, RN

## 2023-03-29 NOTE — Progress Notes (Signed)
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 TDC placement 03/12/23, appreciate assistance - CLIP to [facility] MWF 1145 (arrive 1115am 1st day) - Appreciate lt BCF by Dr. Chestine Spore on 5/15 -> positive thrill -s/p HD on 5/27 and 5/29 had intradialytic hypotension required albumin and midodrine.  Plan for next HD on 5/31. - Continue MWF schedule.  I still have some concerns- her mobility seems terrible-  will need to see if can sit up in chair for HD will plan for Friday -   # Anemia: Continue ESA and monitor hemoglobin.  Required blood transfusion..iron was OK.   Hgb low but stable  # CKD-  Has not needed binder. Continue calcitriol for secondary hyperparathyroidism.  # HTN/volume:   Continue midodrine pre-HD for intradialytic hypotension.  Manage volume with HD. I think now close to EDW.  UF as able. BP other than in HD is OK -  will change midodrine to pre HD only   # Osteomyelitis plan on daptomycin.  Last dose on 6/14.   # Hyponatremia, hypervolemic: Managed with dialysis.   fluid restriction.  # Acute febrile illness: Chest x-ray negative.  On antibiotics for OM.  Blood culture negative so far.  Afebrile today.      # Hypokalemia:  managed with higher potassium bath.  Subjective:  HD yest-  were able to remove 3500 with maneuvers-  said BP got very low-  not sure I believe-  she did have some stomach cramping transiently   Objective Vital signs in last 24 hours: Vitals:   03/29/23 0415 03/29/23 0500 03/29/23 0753 03/29/23 0809  BP: 133/61   (!) 125/55  Pulse: 85  82 81  Resp: 16  16 17   Temp: 98 F (36.7 C)     TempSrc: Oral     SpO2: 96%  94% 100%  Weight:  83.1 kg    Height:       Weight change: -3.3 kg  Intake/Output Summary (Last 24 hours) at 03/29/2023 0843 Last data filed at 03/28/2023 1945 Gross per 24 hour  Intake 734 ml  Output 3500 ml  Net -2766 ml       Labs: RENAL PANEL Recent Labs   Lab 03/25/23 0321 03/26/23 0502 03/27/23 0400 03/28/23 0314 03/29/23 0337  NA 127* 127* 128* 127* 130*  K 3.2* 3.8 3.6 3.7 3.4*  CL 91* 92* 90* 91* 90*  CO2 26 23 26 24 27   GLUCOSE 82 75 62* 139* 101*  BUN 30* 43* 28* 43* 25*  CREATININE 3.14* 3.96* 3.47* 4.72* 3.69*  CALCIUM 7.7* 7.6* 7.2* 7.8* 8.4*  PHOS 3.7 4.4 4.0 4.9* 3.9  ALBUMIN 2.5* 2.5* 2.8* 2.8* 3.4*    Liver Function Tests: Recent Labs  Lab 03/27/23 0400 03/28/23 0314 03/29/23 0337  ALBUMIN 2.8* 2.8* 3.4*   No results for input(s): "LIPASE", "AMYLASE" in the last 168 hours. No results for input(s): "AMMONIA" in the last 168 hours. CBC: Recent Labs    07/26/22 1258 08/09/22 1343 09/06/22 1325 09/06/22 1326 09/07/22 1433 10/10/22 1308 11/08/22 1243 11/15/22 1334 12/06/22 1326 12/06/22 1327 12/13/22 1424 01/12/23 1419 01/28/23 1539 03/02/23 0402 03/02/23 1558 03/24/23 0306 03/25/23 0321 03/26/23 0950 03/26/23 1620 03/28/23 0314  HGB 8.0*   < > 10.0*  --  10.5*   < > 9.9*   < > 8.9*  --    < > 8.3*   < > 7.6*   < > 8.1* 7.9*  7.7* 8.4* 7.8*  MCV 90.7   < > 88.7  --  86.7   < > 91.2   < > 95.5  --    < > 93.3   < > 91.5   < > 90.3 88.4 89.1 91.3 89.8  VITAMINB12  --   --   --   --  273  --   --   --   --   --   --   --   --   --   --   --   --   --   --   --   FERRITIN 394*  --   --  401*  --   --  258  --   --  209  --  277  --   --   --   --   --   --   --   --   TIBC 232*  --  258  --   --   --  256  --  269  --   --  263  --  217*  --   --   --   --   --   --   IRON 64  --  69  --   --   --  53  --  46  --   --  32  --  70  --   --   --   --   --   --   RETICCTPCT 2.2  --   --  1.4  --   --  2.1  --   --  2.1  --  2.6  --   --   --   --   --   --   --   --    < > = values in this interval not displayed.    Cardiac Enzymes: Recent Labs  Lab 03/27/23 0400  CKTOTAL 70   CBG: Recent Labs  Lab 03/23/23 0748 03/23/23 1614  GLUCAP 103* 133*    Iron Studies: No results for input(s): "IRON",  "TIBC", "TRANSFERRIN", "FERRITIN" in the last 72 hours. Studies/Results: DG CHEST PORT 1 VIEW  Result Date: 03/27/2023 CLINICAL DATA:  Shortness of breath EXAM: PORTABLE CHEST 1 VIEW COMPARISON:  03/23/2023 FINDINGS: Cardiac shadow is enlarged but stable. Left-sided dialysis catheter and right-sided tunneled PICC are seen. Postsurgical changes in the thoracolumbar spine are noted. Lungs are hypoinflated without focal confluent infiltrate. Increased central vascular congestion is noted. IMPRESSION: Increased central vascular congestion consistent with mild CHF. Tubes and lines as described. Electronically Signed   By: Alcide Clever M.D.   On: 03/27/2023 21:58    Medications: Infusions:  DAPTOmycin (CUBICIN) 650 mg in sodium chloride 0.9 % IVPB Stopped (03/27/23 1649)    Scheduled Medications:  acetaminophen  1,000 mg Oral TID   acidophilus  2 capsule Oral TID   apixaban  2.5 mg Oral BID   calcitRIOL  0.5 mcg Oral Daily   calcium carbonate  1 tablet Oral Daily   Chlorhexidine Gluconate Cloth  6 each Topical Q0600   darbepoetin (ARANESP) injection - NON-DIALYSIS  100 mcg Subcutaneous Q Fri-1800   diclofenac  1 patch Transdermal BID   DULoxetine  60 mg Oral Daily   feeding supplement (NEPRO CARB STEADY)  237 mL Oral TID BM   lidocaine  2 patch Transdermal Q24H   loratadine  10 mg Oral Daily   methocarbamol  750 mg Oral TID  midodrine  10 mg Oral Q M,W,F-HD   mometasone-formoterol  2 puff Inhalation BID   multivitamin  1 tablet Oral QHS   oxyCODONE  15 mg Oral Q12H   polyethylene glycol  17 g Oral BID   pregabalin  75 mg Oral Daily   senna-docusate  1 tablet Oral BID   sodium chloride flush  10-40 mL Intracatheter Q12H   sodium chloride flush  3 mL Intravenous Q12H    have reviewed scheduled and prn medications.  Physical Exam: General: more conversive today -  still mobility seems poor  Heart:RRR, s1s2 nl Lungs:clear b/l, no crackle Abdomen:soft, Non-tender,  non-distended Extremities:  edema better Dialysis Access: [facility] TDC, L BCF + T/B    [name] [name] 03/29/2023,8:43 AM  LOS: 30 days

## 2023-03-29 NOTE — TOC Progression Note (Addendum)
Transition of Care (TOC) - Progression Note   Patient from home with husband . Husband works, however she has someone who assists her 5 hours a day 5 days a week , Husband is home the other 2 days . She has an Passenger transport manager . PTA she was transferring on own from bed to w/c etc. She feels she can still transfer herself but does feels weaker. Patient feels safe  going home at discharge as long as she has her pain medication for her chronic pain .   Patient has electric wheel chair, walker, raised commode seat and rails at home.   Patient also has home oxygen at night 3l [place] through [employer].   Patient [facility]   Patient aware she must sit in chair for hemodialysis and states she has.   Secure chat from PTA: She will need someone with her when transferring for balance and when getting out of bed, but min-mod assist. Explained same to patient at bedside and husband on speaker phone.  Both voiced understanding   Secure chat from MD : wanting NCM to let patient and husband know that discharge MD will  only be filling a few days of the opoid pain meds. She and husband will need to follow up with her doctor post discharge. Explained same to patient at bedside and husband on speaker phone.  Both voiced understanding.    Patient Details  Name: [name] [name] MRN: [id number] Date of Birth: 1956/05/30  Transition of Care [facility]) CM/SW Contact  Gracilyn Gunia, Adria Devon, RN Phone Number: 03/29/2023, 10:55 AM  Clinical Narrative:       Expected Discharge Plan: Home w Home Health Services Barriers to Discharge: Continued Medical Work up  Expected Discharge Plan and Services   Discharge Planning Services: CM Consult Post Acute Care Choice: Home Health Living arrangements for the past 2 months: Single Family Home                 DME Arranged: N/A DME Agency: NA       HH Arranged: RN, PT HH Agency: [facility] Date [facility] Agency Contacted: 03/22/23 Time HH Agency  Contacted: 1205 Representative spoke with at [facility] Agency: Kandee Keen   Social Determinants of Health (SDOH) Interventions SDOH Screenings   Food Insecurity: No Food Insecurity (02/26/2023)  Housing: Low Risk  (02/26/2023)  Transportation Needs: No Transportation Needs (02/26/2023)  Utilities: Not At Risk (02/26/2023)  Alcohol Screen: Low Risk  (12/30/2021)  Depression (PHQ2-9): Low Risk  (08/23/2022)  Financial Resource Strain: Low Risk  (12/30/2021)  Physical Activity: Inactive (12/30/2021)  Social Connections: Socially Integrated (12/30/2021)  Stress: Stress Concern Present (12/30/2021)  Tobacco Use: Low Risk  (03/15/2023)    Readmission Risk Interventions    06/23/2022    2:13 PM  Readmission Risk Prevention Plan  Transportation Screening Complete  PCP or Specialist Appt within 3-5 Days Complete  HRI or Home Care Consult Complete  Social Work Consult for Recovery Care Planning/Counseling Complete  Palliative Care Screening Not Applicable  Medication Review [employer]) Complete

## 2023-03-29 NOTE — Progress Notes (Signed)
Pt. noted to have increased confusion this afternoon, however, pt. does have periods of forgetfulness following administration of opioid pain medication. Pt. also noted to have some expiratory wheezing at this time. Pt.'s O2 sat is 100% on 2 LPM. B/P is 150/81 & temp. is 98.3. MD notified. Order given to have RT administer PRN neb tx. and continue to monitor. Will continue to monitor for changes and report changes to MD accordingly.

## 2023-03-29 NOTE — Progress Notes (Signed)
RT called to patient's room for a PRN Albuterol breathing treatment. RT found patient less responsive than this morning. RT contacted physician to see if an ABG was needed. Physician responded and ordered a VBG for Lab to obtain. RT will continue to monitor.

## 2023-03-29 NOTE — Progress Notes (Addendum)
PROGRESS NOTE   [name] [name]  ZOX:[id number]    DOB: [birth year]    DOA: 02/26/2023  PCP: [name] Schultz, DO   I have briefly reviewed patients previous medical records in [facility].  Chief Complaint  Patient presents with   Abdominal Pain   Back Pain    Brief Narrative:   67 year old who is moslty wheelchair bound with PMH of CKD stage V, hypertension, cirrhosis of the liver, OSA, morbid obesity, type II DM with neuropathy, hypothyroidism, CAD, PTSD/depression, L1 osteomyelitis status post L1 corpectomy, T10-T11 discitis osteomyelitis status post posterior arthrodesis of T8-T11 and removal of T11 pedicle screws and rod in 12/23, in April 2024, diagnosed with T4-T5 discitis osteomyelitis which was being treated with daptomycin, Neurosurgery did not recommend any further interventions, presented to the hospital on 02/26/2023 for worsening back pain.  This admission diagnosed with recurrent discitis/osteomyelitis, initial acute on chronic kidney disease which has since progressed to new ESRD and on HD.  Due to ongoing intermittent high fevers, ID was reconsulted on 5/29.  Nephrology following for HD needs.  Palliative care consulted for pain management.  5/15, patient had a fall in the OR while being transferred from the OR table.  For details of hospital course, please refer to prior IM provider notes including progress note by Dr. Ramiro Harvest from 03/27/2023.    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  She has had extensive history of discitis/osteomyelitis, surgical intervention, antibiotic treatment etc. prior to this admission.  Please refer to ID follow-up progress note from 03/28/2023 for details. MRI T-spine 4/30 showed persistent T4-T5 discitis/osteomyelitis with mild interval collapse of the T4 vertebral body.  Also had interval increase in size of suspected dorsal epidural fluid collection measuring 7 mm, possibly abscess or phlegmon. Blood cultures x 2 from 5/24 & 5/29: Negative.  Fine-needle aspirate culture from intervertebral disc T4-5 from 5/3: Negative.  MRSA PCR negative. On IV daptomycin since 4/30. Noted ongoing fevers up to 103 F yesterday.  Thereby consulted ID again and their input much appreciated.  Etiology of these fevers unclear but has multiple source possibilities as per their note.  Chest x-ray 5/28 unremarkable.  UA not indicative of UTI.  Fistula site does not appear infected.  CRP 18.5 (1  on 4/30) and ESR 82 (74 on 4/30).  Surveillance blood cultures from 5/29: NTD.  Continuing daptomycin for now and following clinically.  If fever recurs, then per ID, may need reimaging of her back versus empiric HD line removal/holiday.  No fever since the night of 5/28.  New ESRD Initially presented with  acute kidney injury complicating stage V CKD which has since progressed to ESRD.  Nephrology following. TDC placed 03/12/2023 & AVF 03/14/2023 by vascular surgery. Undergoing HD on MWF schedule.  Has been clipped to outpatient HD. See concerns regarding fever and line infection as noted above. As per discussion with TOC again today, patient's insurance would likely not cover SNF even if recommended.  Discussed with nephrology regarding attempting to sit up in chair for next HD and will attempt to do this on 5/31. Please refer to detailed discussion by TOC on 5/30 regarding discharge disposition.  It appears that patient has good support at home from spouse when he is not working, caregivers 5 days a week for up to 5 hours/day, neighbors close by per patient report, all DME's and both patient and spouse and they feel safe returning home.  Acute on chronic pain: Due to recurrent osteomyelitis.  PTA, patient noted to be on Norco, Valium and Lyrica. Palliative care consulted for symptom management. Pain appears to be reasonably controlled on current multimodality pain regimen which includes OxyContin, as needed oxycodone and others. Outpatient follow-up with PCP/referral to pain clinic by PCP.  As per MDs advice, TOC has discussed in detail with patient/spouse that they are to follow-up with their PCP for opioid refills which will be filled only for a few days at time of discharge.  Controlled 2 DM with ESRD: A1c 4.6 and 4.7 this admission.  Early on admission had hypoglycemic episodes.  Off all insulins.  Also legally blind in both eyes, left greater than sign right.  Diarrhea/constipation: Workup revealed C. difficile antigen positive but toxigenic PCR negative.  Got Questran.  Then had constipation treated with Dulcolax suppository, MiraLAX, sorbitol, soapsuds enemas.  Continue bowel regimen.  Improved.  Urinary retention: Required Foley catheter earlier on in hospitalization.  Foley catheter  eventually removed on 5/20.  Voiding without difficulty.  Normocytic anemia: Likely multifactorial due to anemia of chronic disease, ESRD.  S/p multiple PRBC transfusions this admission.  Has received IV iron. Continue ESA and monitor CBCs.  Transfuse for hemoglobin 7 g or less.  Periodic monitoring of hemoglobin across dialysis.  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  Stable.  No bleeding reported.  Platelet counts had normalized on 5/29.  Chronic diastolic CHF: Volume management across dialysis.  Lasix discontinued.  Euvolemic.  Hypertension/hypotension: Continue midodrine pre-HD for intradialytic hypotension.  Depression/anxiety:  Continue Cymbalta/diazepam.  Chronic lower extremity DVTs: On Eliquis.  Fall in the OR: Sustained during transfer from the OR table to the stretcher.  CT C/A/P with no acute trauma.  CT head negative.  Plain films of left shoulder negative for any fracture.  PT and OT have evaluated.  Hyponatremia, hypervolemic:  Managed with dialysis.  Fluid restriction.  Stable.   Hypokalemia:   managed with higher potassium bath.  Chest pain/dyspnea Noted 5/24.  Some EKG changes.  Cardiac enzymes flat.  Chest x-ray with no acute infiltrate.  TTE showed normal EF with no wall motion abnormalities and grade 1 diastolic dysfunction.  Chest pain resolved.  Atypical chest pain, suspect musculoskeletal.  Altered mental status: 5/30 afternoon, RN advised that patient was more confused than she  was this morning but also stated that staff has noticed this intermittently in the past.  Also wheezing.  Came by and evaluated patient in person.  AMS suspected due to polypharmacy pain meds but pain appears to be controlled and stable on this regimen.  Will reduce Oxy IR from 10 mg to 5-10 mg every 8 hours as needed (she has not taken more than 3 doses per day over the last week).  Lyrica has already been reduced to 75 mg once daily.  Also on Valium 5 Mg every 12 hours as  needed for anxiety, Cymbalta 60 Mg daily and Robaxin 750 Mg 3 times daily.  No focal neurological deficits, low index of suspicion for acute stroke.  Checking VBG for CO2 retention although low index of suspicion for same.  Discussed in detail with RN, hold off on opioid pain meds if patient drowsy or altered. CBG 91   Body mass index is 37 kg/m.  Nutritional Status Nutrition Problem: Increased nutrient needs Etiology: acute illness, catabolic illness (acute OM of thoracic spine, new ESRD requiring HD) Signs/Symptoms: estimated needs Interventions: MVI, Nepro shake, Education  Pressure Ulcer: Pressure Injury 02/26/23 Heel Left (Active)  02/26/23 1826  Location: Heel  Location Orientation: Left  Staging:   Wound Description (Comments):   Present on Admission: Yes  Dressing Type Gauze (Comment) 03/28/23 1945  Continue current wound care as per WOC RN recommendations.  ACP Documents: None present DVT prophylaxis: apixaban (ELIQUIS) tablet 2.5 mg Start: 03/15/23 1145 Place and maintain sequential compression device Start: 03/01/23 1536     Code Status: Full Code:  Family Communication: Spouse via phone, updated care and answered all questions including this afternoon's mental status change and medication adjustments made for same.  He to advise that sometimes when he comes in the evening to see her, patient does not fully coherent. Disposition:  Status is: Inpatient Remains inpatient appropriate because: Needs to be able to sit up for HD.  Hopeful discharge on 5/31 or 6/1 pending ability to set up for HD, nephrology and ID clearance.     Consultants:   ID: Dr. [name] 02/10/2023 Wound care RN IR: Dr.De Melchor Amour 02/28/2023 Nephrology: Dr. Glenna Fellows 03/01/2023 Vascular surgery: Dr. Randie Heinz 03/11/2023 Palliative care: Dr. Linna Darner 03/13/2023  Procedures:   Placement of tunneled left-sided hemodialysis catheter: Per IR: Dr. Fredia Sorrow 03/12/2023 Placement of left jugular nontunneled dialysis  catheter using ultrasound and fluoroscopic guidance: Per IR, Dr. Lowella Dandy 03/02/2023 Fluoroscopy guided T5 core biopsy and T4-5 disc aspiration: Per Dr.Katyucia, IR, 03/02/2023 Placement of left jugular type lumen dialysis catheter: Per IR, Dr. Lowella Dandy 03/02/2023 Left brachiocephalic AV fistula placement: Per vascular surgery: Dr. Chestine Spore 03/14/2023 Ultrasound-guided access left internal jugular vein, placement of left internal jugular vein tunneled dialysis catheter: Per Dr. Chestine Spore 03/12/2023  Antimicrobials:   As noted above.   Subjective:  Patient reports that she lives with her spouse.  Spouse works 5 days a week from 5 AM until 3 PM.  Has caregivers who come home 5 days a week for 5 hours.  Moves around in an electric chair.  Apart from spouse, caregivers has neighbors close by who assist as well.  Pain is controlled.  Had soft BM.  Tolerating diet.  No other complaints.  Afternoon 5/30: Alert, oriented to self and place.  Somewhat confused.  States that she is in the hospital to take care of animals.  Follows simple instructions.  Objective:   Vitals:   03/29/23 4098 03/29/23 0500 03/29/23 1191 03/29/23 4782  BP: 133/61   (!) 125/55  Pulse: 85  82 81  Resp: 16  16 17   Temp: 98 F (36.7 C)   97.9 F (36.6 C)  TempSrc: Oral   Oral  SpO2: 96%  94% 100%  Weight:  83.1 kg    Height:        General exam: Middle-age female, small built and frail, chronically ill looking, lying supine in bed without distress. Eyes: Legally blind in both eyes.  Bilateral IOLs present. PERTLA Respiratory system: Clear to auscultation.  No increased work of breathing. Cardiovascular system: S1 & S2 heard, RRR. No JVD, murmurs, rubs, gallops or clicks. No pedal edema.  Telemetry personally reviewed: Sinus rhythm with very occasional PVCs. Gastrointestinal system: Abdomen is nondistended, soft and nontender. No organomegaly or masses felt. Normal bowel sounds heard. Central nervous system: Alert and oriented x 2.  No  focal neurological deficits. Extremities: Symmetric 5 x 5 power. Skin: Bilateral heel ulcers area bandaged which is clean and dry.  Dressings reportedly being changed daily. Psychiatry: Judgement and insight appear normal. Mood & affect appropriate. Lines: As per ID on 5/30, "right IJ CVC in place no warmth or tenderness. Left [place] HD catheter with no warmth or tenderness. Left AV fistula site looks okay."    Data Reviewed:   I have personally reviewed following labs and imaging studies   CBC: Recent Labs  Lab 03/23/23 1635 03/24/23 0306 03/25/23 0321 03/26/23 0950 03/26/23 1620 03/28/23 0314  WBC 3.5* 3.4*   < > 3.7* 4.8 8.6  NEUTROABS 3.2 2.4  --  2.7  --   --   HGB 8.5* 8.1*   < > 7.7* 8.4* 7.8*  HCT 26.2* 25.2*   < > 23.7* 26.2* 24.6*  MCV 88.8 90.3   < > 89.1 91.3 89.8  PLT 91* 103*   < > 102* 104* 161   < > = values in this interval not displayed.    Basic Metabolic Panel: Recent Labs  Lab 03/25/23 0321 03/26/23 0502 03/27/23 0400 03/28/23 0314 03/29/23 0337  NA 127* 127* 128* 127* 130*  K 3.2* 3.8 3.6 3.7 3.4*  CL 91* 92* 90* 91* 90*  CO2 26 23 26 24 27   GLUCOSE 82 75 62* 139* 101*  BUN 30* 43* 28* 43* 25*  CREATININE 3.14* 3.96* 3.47* 4.72* 3.69*  CALCIUM 7.7* 7.6* 7.2* 7.8* 8.4*  PHOS 3.7 4.4 4.0 4.9* 3.9    Liver Function Tests: Recent Labs  Lab 03/25/23 0321 03/26/23 0502 03/27/23 0400 03/28/23 0314 03/29/23 0337  ALBUMIN 2.5* 2.5* 2.8* 2.8* 3.4*    CBG: Recent Labs  Lab 03/23/23 0748 03/23/23 1614  GLUCAP 103* 133*    Microbiology Studies:   Recent Results (from the past 240 hour(s))  Culture, blood (Routine X 2) w Reflex to ID Panel     Status: None   Collection Time: 03/23/23  4:22 PM   Specimen: BLOOD  Result Value Ref Range Status   Specimen Description BLOOD RIGHT ANTECUBITAL  Final   Special Requests   Final    BOTTLES DRAWN AEROBIC AND ANAEROBIC Blood Culture adequate volume   Culture   Final    NO GROWTH 5  DAYS Performed at [facility], [address], [place], [place] [address]    Report Status 03/28/2023 FINAL  Final  Culture, blood (Routine X 2) w Reflex to ID Panel     Status: None   Collection Time: 03/23/23  4:52 PM   Specimen:  BLOOD RIGHT HAND  Result Value Ref Range Status   Specimen Description BLOOD RIGHT HAND  Final   Special Requests   Final    BOTTLES DRAWN AEROBIC ONLY Blood Culture adequate volume   Culture   Final    NO GROWTH 5 DAYS Performed at [facility], [address], [place], [place] [address]    Report Status 03/28/2023 FINAL  Final  Culture, blood (Routine X 2) w Reflex to ID Panel     Status: None (Preliminary result)   Collection Time: 03/28/23  2:07 PM   Specimen: BLOOD  Result Value Ref Range Status   Specimen Description BLOOD RIGHT ANTECUBITAL  Final   Special Requests   Final    BOTTLES DRAWN AEROBIC AND ANAEROBIC Blood Culture results may not be optimal due to an inadequate volume of blood received in culture bottles   Culture   Final    NO GROWTH < 24 HOURS Performed at [facility], [address] [place], [place] [address]    Report Status PENDING  Incomplete  Culture, blood (Routine X 2) w Reflex to ID Panel     Status: None (Preliminary result)   Collection Time: 03/28/23  2:09 PM   Specimen: BLOOD  Result Value Ref Range Status   Specimen Description BLOOD RIGHT ANTECUBITAL  Final   Special Requests   Final    BOTTLES DRAWN AEROBIC AND ANAEROBIC Blood Culture results may not be optimal due to an inadequate volume of blood received in culture bottles   Culture   Final    NO GROWTH < 24 HOURS Performed at [facility], [address], [place], [place] [address]    Report Status PENDING  Incomplete    Radiology Studies:  DG CHEST PORT 1 VIEW  Result Date: 03/27/2023 CLINICAL DATA:  Shortness of breath EXAM: PORTABLE CHEST 1 VIEW COMPARISON:  03/23/2023 FINDINGS: Cardiac shadow is enlarged but stable. Left-sided  dialysis catheter and right-sided tunneled PICC are seen. Postsurgical changes in the thoracolumbar spine are noted. Lungs are hypoinflated without focal confluent infiltrate. Increased central vascular congestion is noted. IMPRESSION: Increased central vascular congestion consistent with mild CHF. Tubes and lines as described. Electronically Signed   By: Alcide Clever M.D.   On: 03/27/2023 21:58    Scheduled Meds:    acetaminophen  1,000 mg Oral TID   acidophilus  2 capsule Oral TID   apixaban  2.5 mg Oral BID   calcitRIOL  0.5 mcg Oral Daily   calcium carbonate  1 tablet Oral Daily   [START ON 03/30/2023] Chlorhexidine Gluconate Cloth  6 each Topical Q0600   darbepoetin (ARANESP) injection - NON-DIALYSIS  100 mcg Subcutaneous Q Fri-1800   diclofenac  1 patch Transdermal BID   DULoxetine  60 mg Oral Daily   feeding supplement (NEPRO CARB STEADY)  237 mL Oral TID BM   lidocaine  2 patch Transdermal Q24H   loratadine  10 mg Oral Daily   methocarbamol  750 mg Oral TID   midodrine  10 mg Oral Q M,W,F-HD   mometasone-formoterol  2 puff Inhalation BID   multivitamin  1 tablet Oral QHS   oxyCODONE  15 mg Oral Q12H   polyethylene glycol  17 g Oral BID   pregabalin  75 mg Oral Daily   senna-docusate  1 tablet Oral BID   sodium chloride flush  10-40 mL Intracatheter Q12H   sodium chloride flush  3 mL Intravenous Q12H  Continuous Infusions:    DAPTOmycin (CUBICIN) 650 mg in sodium chloride 0.9 % IVPB Stopped (03/27/23 1649)     LOS: 30 days     Marcellus Scott, MD,  FACP, [facility], [facility], [facility], [facility]   [facility] [place]     To contact the attending provider between 7A-7P or the covering provider during after hours 7P-7A, please log into the web site [email or web address] and access using universal [place] password for that web site. If you do not have the password, please call the hospital operator.  03/29/2023, 12:28 PM

## 2023-03-29 NOTE — Progress Notes (Signed)
Venous blood gas results received and are as follows: pH 7.33, pCO2 55, pO2 <31, Acid base excess 2.3, Bicarb, 29.5, & O2 sat 37.7. The pt.'s pulse ox is 99% on 2 LPM at this time. MD notified of critical result. No new orders at this time. Will continue to monitor for changes and report changes to MD accordingly.

## 2023-03-29 NOTE — Progress Notes (Signed)
Nutrition Follow-up  DOCUMENTATION CODES:   Obesity unspecified  INTERVENTION:  Encourage good PO intake Continue Nepro Shake po TID, each supplement provides 425 kcal and 19 grams protein Continue Renal Multivitamin w/ minerals daily  NUTRITION DIAGNOSIS:  Increased nutrient needs related to acute illness, catabolic illness (acute OM of thoracic spine, new ESRD requiring HD) as evidenced by estimated needs. - Ongoing   GOAL:  Patient will meet greater than or equal to 90% of their needs - Progressing   MONITOR:  PO intake, Supplement acceptance, Labs, I & O's, Weight trends  REASON FOR ASSESSMENT:  Consult Diet education (ESRD)  ASSESSMENT:  67 y.o. female admits related to abdominal pain, back pain, nausea, and vomiting. PMH includes: acute MI, anemia, CAD, cirrhosis, DM, HTN, HLD, MRSA. Pt is currently receiving medical management related to acute osteomyelitis of thoracic spine.  5/3 s/p disc aspiration 5/13 new ESRD s/p [facility]  5/15 fistula placement  Pt laying in bed, slightly confused. Responds minimally to RD questions. Reports eating ok this morning and said that she has been trying to drink the Nepro shakes. RD observed two Nepro's on bedside table, one open and one unopened.  Meal Intake  5/23-5/29: 0-100% x 4 meals (average 50%)  Medications reviewed and include: Risaquad, Calcitriol, Calcium Carbonate, Rena-vit, Miralax, Senokot-S, IV antibiotics  Labs reviewed: Sodium 130, Potassium 3.6, Phosphorus 3.1   HD on 5/29 Net UF: 3500 mL  Diet Order:   Diet Order             Diet renal with fluid restriction Fluid restriction: 1200 mL Fluid; Room service appropriate? Yes; Fluid consistency: Thin  Diet effective now                  EDUCATION NEEDS:   Education needs have been addressed  Skin:  Skin Assessment: Skin Integrity Issues: Skin Integrity Issues:: Other (Comment) Other: pressure injury to L heel  Last BM:  5/29  Height:  Ht Readings from  Last 1 Encounters:  02/26/23 4\' 11"  (1.499 m)   Weight:  Wt Readings from Last 1 Encounters:  03/29/23 83.1 kg   Ideal Body Weight:  44.7 kg  BMI:  Body mass index is 37 kg/m.  Estimated Nutritional Needs:  Kcal:  1500-1700 Protein:  75-90g Fluid:  >/=1.5L   Kirby Crigler RD, LDN Clinical Dietitian See [facility] for contact information.

## 2023-03-29 NOTE — Progress Notes (Signed)
Physical Therapy Treatment Patient Details Name: [name] [name] MRN: [id number] DOB: 06-26-1956 Today's Date: 03/29/2023   History of Present Illness Pt is 67 year old who is moslty wheelchair bound and has CKD stage V, hypertension, cirrhosis of the liver, OSA, morbid obesity, L1 osteomyelitis status post L1 corpectomy, T10-T11 discitis osteomyelitis status post posterior arthrodesis of T8-T11 and removal of T11 pedicle screws and rod in 12/23.  Last month, diagnosed with T4-T5 discitis osteomyelitis which was being treated with daptomycin.  Neurosurgery did not recommend any further interventions.     The patient returns to the hospital for worsening back pain for [redacted] week along with an episode of vomiting.  Pt found to have AKI requiring 2 cycles of HD.  Also, with ongoing osteomyelitis/discitis Thoracic spine with T4-5 disc aspiration by IR on 5/3.  Neurosurgery recommends further medical management, no surgical intervention at this time.    PT Comments    Pt received in supine and agreeable to session. Pt with improved cognition and ability to follow commands this session. Pt continues to have no recall of back precautions requiring education. Pt requiring cues for logroll technique and up to mod A to perform. Pt able to tolerate multiple standing trials from EOB and recliner with min guard and improved anterior lean with cues. Pt step pivoting to the recliner with min A for balance due to posterior lean and pt reporting this is caused by toe amputations. Pt continues to benefit from PT services to progress toward functional mobility goals.    Recommendations for follow up therapy are one component of a multi-disciplinary discharge planning process, led by the attending physician.  Recommendations may be updated based on patient status, additional functional criteria and insurance authorization.     Assistance Recommended at Discharge Intermittent Supervision/Assistance  Patient can return home  with the following A little help with walking and/or transfers;A little help with bathing/dressing/bathroom;Assistance with cooking/housework;Help with stairs or ramp for entrance   Equipment Recommendations  None recommended by PT    Recommendations for Other Services       Precautions / Restrictions Precautions Precautions: Back Precaution Booklet Issued: No Precaution Comments: verbally reviewed precautions Required Braces or Orthoses: Spinal Brace Spinal Brace: Thoracolumbosacral orthotic Spinal Brace Comments: No orders but pt had from prior admission and wears when OOB Restrictions Weight Bearing Restrictions: No     Mobility  Bed Mobility Overal bed mobility: Needs Assistance Bed Mobility: Rolling, Sidelying to Sit Rolling: Min assist Sidelying to sit: Mod assist       General bed mobility comments: Cues for logroll technique. Mod A for trunk elevation. Pt able to scoot forward at EOB with increased time    Transfers Overall transfer level: Needs assistance Equipment used: Rolling walker (2 wheels) Transfers: Sit to/from Stand, Bed to chair/wheelchair/BSC Sit to Stand: Min guard   Step pivot transfers: Min assist       General transfer comment: From EOB x2 and from recliner x5 with min guard for safety and cues for anterior lean. Pt step pivoting to recliner with cues for sequencing and min A for balance due to posterior lean      Balance Overall balance assessment: Needs assistance Sitting-balance support: Feet supported, Bilateral upper extremity supported Sitting balance-Leahy Scale: Good Sitting balance - Comments: sitting EOB   Standing balance support: During functional activity, Reliant on assistive device for balance, Bilateral upper extremity supported Standing balance-Leahy Scale: Fair Standing balance comment: with RW support and intermittent min A due to posterior lean  Cognition Arousal/Alertness:  Awake/alert Behavior During Therapy: WFL for tasks assessed/performed Overall Cognitive Status: Within Functional Limits for tasks assessed                                          Exercises General Exercises - Lower Extremity Long Arc Quad: AROM, Both, Seated, 5 reps Hip Flexion/Marching: AROM, Both, 5 reps, Seated    General Comments        Pertinent Vitals/Pain Pain Assessment Pain Assessment: 0-10 Pain Score: 10-Worst pain ever Pain Location: back Pain Descriptors / Indicators: Grimacing, Guarding Pain Intervention(s): Limited activity within patient's tolerance, Repositioned, Monitored during session     PT Goals (current goals can now be found in the care plan section) Acute Rehab PT Goals Patient Stated Goal: return home PT Goal Formulation: With patient/family Time For Goal Achievement: 03/20/23 Potential to Achieve Goals: Good Progress towards PT goals: Progressing toward goals    Frequency    Min 3X/week      PT Plan Current plan remains appropriate       AM-PAC PT "6 Clicks" Mobility   Outcome Measure  Help needed turning from your back to your side while in a flat bed without using bedrails?: A Little Help needed moving from lying on your back to sitting on the side of a flat bed without using bedrails?: A Lot Help needed moving to and from a bed to a chair (including a wheelchair)?: A Little Help needed standing up from a chair using your arms (e.g., wheelchair or bedside chair)?: A Little Help needed to walk in hospital room?: A Lot Help needed climbing 3-5 steps with a railing? : Total 6 Click Score: 14    End of Session   Activity Tolerance: Patient tolerated treatment well Patient left: with call bell/phone within reach;in chair;with chair alarm set Nurse Communication: Mobility status PT Visit Diagnosis: Other abnormalities of gait and mobility (R26.89);Muscle weakness (generalized) (M62.81);Pain     Time:  0950-1020 PT Time Calculation (min) (ACUTE ONLY): 30 min  Charges:  $Therapeutic Activity: 23-37 mins                     Johny Shock, PTA Acute Rehabilitation Services Secure Chat Preferred  [phone]    Johny Shock 03/29/2023, 10:28 AM

## 2023-03-29 NOTE — Progress Notes (Signed)
Pt. continues to remain stable at this time with notable confusion. Pt.'s spouse present at bedside. Pt. resting comfortably in bed with no s/s of acute distress noted. Will continue to monitor for changes and report changes to MD accordingly.

## 2023-03-29 NOTE — Progress Notes (Signed)
[facility]  Date of Admission:  02/26/2023           Reason for visit: Follow up on Fevers, Discitis/OM  Current antibiotics: Daptomycin  ASSESSMENT:    67 y.o. adult admitted with:  Discitis/osteomyelitis: Patient with extensive history of infection in her back with known thoracolumbar discitis/osteomyelitis and instrumentation.  Previously, in March 2021 she was treated for thoracolumbar infection due to Pseudomonas. However, more recent cultures have yielded MRSE with T10-T11 discitis with erosion requiring revision and extension of fusion to T8 in August 2023.  Imaging in August 2023 also noted early discitis/OM of T4-5.  She was supposed to be on doxycycline suppression but did not tolerate this and subsequently stopped antibiotics after IV therapy in October 2023.  She has subsequently had progression of infection at T4-5 noted on MRI in March 2024 with aspiration again yielding MRSE and currently on a prolonged course of daptomycin initially with an anticipated end date of 03/30/23.  However, she has been readmitted with worsening back pain and MRI showing new osteomyelitis at T3 with vertebral collapse of T4 since her previous MRI in March.  She had repeat aspiration 03/02/23 which was negative.  Unclear if this MRI finding on 02/27/23 was new infection vs radiographic lag.  Nonetheless, her daptomycin was extended an additional 2 weeks through 04/13/23 by her primary ID provider, Dr Thedore Mins with plan for oral suppression thereafter. Acute on CKD stage V with progression to ESRD: Worsening renal function this admission now on HD via left [place] HD catheter (placed 03/12/23) while left AVF matures (placed 03/14/23). Fevers: Unclear source of fevers that have developed x 2 on 5/24 and 5/28.  Afebrile since that time.  RECOMMENDATIONS:    Unclear etiology of fevers.  Multiple potential sources including discitis/osteomyelitis, line infection, AV fistula site infection,  versus other etiology. Blood cultures 03/23/2023 negative and repeat blood cultures obtained 03/28/2023 no growth to date (only drawn from right [facility]).  She is on antibiotics and as such her cultures may be negative in spite of possible line infection. Chest x-ray unremarkable, UA unremarkable, fistula and line sites look okay.  She complains of some worsening back pain today but overall seems stable and inflammatory markers are also relatively stable. Would have concern for possible line infection but unclear at this time. Legrand Rams continue to monitor clinically and follow-up for repeat fevers.  If she fevers again, we will then have to consider possible reimaging of her back versus empiric line removal/holiday.  Would have some hesitation with repeat spinal imaging without new symptoms given the uncertainty of what to make of the radiographic changes that we will most likely be seen.  Of note, her most recent CT abdomen/pelvis did not note any significant new musculoskeletal changes in her lumbar spine. Following.   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 acetaminophen  1,000 mg Oral TID  . acidophilus  2 capsule Oral TID  . apixaban  2.5 mg Oral BID  . calcitRIOL  0.5 mcg Oral Daily  . calcium carbonate  1 tablet Oral Daily  . Chlorhexidine Gluconate Cloth  6 each Topical Q0600  . darbepoetin (ARANESP) injection - NON-DIALYSIS  100 mcg Subcutaneous Q Fri-1800  . diclofenac  1 patch Transdermal BID  . DULoxetine  60 mg Oral Daily  . feeding supplement (NEPRO CARB STEADY)  237 mL Oral TID BM  . lidocaine  2 patch Transdermal Q24H  . loratadine  10 mg Oral Daily  . methocarbamol  750 mg Oral TID  . midodrine  10 mg Oral Q M,W,F-HD  . mometasone-formoterol  2 puff Inhalation BID  . multivitamin  1 tablet Oral QHS  . oxyCODONE  15 mg Oral Q12H  . polyethylene glycol  17 g Oral BID  . pregabalin  75 mg Oral Daily  . senna-docusate  1 tablet Oral BID  . sodium chloride flush  10-40 mL Intracatheter Q12H  . sodium chloride flush  3 mL Intravenous Q12H   Continuous Infusions: . DAPTOmycin (CUBICIN) 650 mg in sodium chloride 0.9 % IVPB Stopped (03/27/23 1649)   PRN Meds:.albuterol, alteplase, diazepam, heparin, hydrALAZINE, lidocaine (PF), lidocaine-prilocaine, naLOXone (NARCAN)  injection, nitroGLYCERIN, ondansetron (ZOFRAN) IV, mouth rinse, oxyCODONE, oxyCODONE, pentafluoroprop-tetrafluoroeth  SUBJECTIVE:   24 hour events:  No acute events noted Status postdialysis yesterday Tmax 98.3, however, she reports a fever of 101 degrees that was not recorded.  Discussed with her nurse who was also unable to confirm this information.  Patient reports some shakes and chills.  She states her back pain is worse today than yesterday    Review of Systems  All other systems reviewed and are negative.  Except as noted  above in the HPI   OBJECTIVE:   Blood pressure (!) 125/55, pulse 81, temperature 97.9 F (36.6 C), temperature source Oral, resp. rate 17, height 4\' 11"  (1.499 m), weight 83.1 kg, SpO2 100 %. Body mass index is 37 kg/m.  Physical Exam Constitutional:      Comments: Chronically ill-appearing woman, lying in bed, no acute distress  HENT:     Head: Normocephalic and atraumatic.  Eyes:     Extraocular Movements: Extraocular movements intact.     Conjunctiva/sclera: Conjunctivae normal.  Cardiovascular:     Comments: Right IJ CVC in place no warmth or tenderness. Left [place] HD catheter with no warmth or tenderness. Left AV fistula site looks okay. Pulmonary:     Effort: Pulmonary effort is normal. No respiratory distress.  Abdominal:     General: There is no distension.     Palpations: Abdomen is soft.  Musculoskeletal:     Cervical back: Normal range of motion and neck supple.     Comments: Her bilateral heel ulcers are bandaged.  She reports they are being changed regularly.  Skin:  General: Skin is warm and dry.  Neurological:     General: No focal deficit present.     Mental Status: She is oriented to person, place, and time.  Psychiatric:        Mood and Affect: Mood normal.        Behavior: Behavior normal.      Lab Results: Lab Results  Component Value Date   WBC 8.6 03/28/2023   HGB 7.8 (L) 03/28/2023   HCT 24.6 (L) 03/28/2023   MCV 89.8 03/28/2023   PLT 161 03/28/2023    Lab Results  Component Value Date   NA 130 (L) 03/29/2023   K 3.4 (L) 03/29/2023   CO2 27 03/29/2023   GLUCOSE 101 (H) 03/29/2023   BUN 25 (H) 03/29/2023   CREATININE 3.69 (H) 03/29/2023   CALCIUM 8.4 (L) 03/29/2023   GFRNONAA 13 (L) 03/29/2023   GFRAA 31 (L) 06/16/2020    Lab Results  Component Value Date   ALT 9 03/19/2023   AST 14 (L) 03/19/2023   ALKPHOS 118 03/19/2023   BILITOT 1.2 03/19/2023       Component Value Date/Time   CRP 18.5 (H) 03/28/2023 1407        Component Value Date/Time   ESRSEDRATE 82 (H) 03/28/2023 1407     I have reviewed the micro and lab results in Epic.  Imaging: DG CHEST PORT 1 VIEW  Result Date: 03/27/2023 CLINICAL DATA:  Shortness of breath EXAM: PORTABLE CHEST 1 VIEW COMPARISON:  03/23/2023 FINDINGS: Cardiac shadow is enlarged but stable. Left-sided dialysis catheter and right-sided tunneled PICC are seen. Postsurgical changes in the thoracolumbar spine are noted. Lungs are hypoinflated without focal confluent infiltrate. Increased central vascular congestion is noted. IMPRESSION: Increased central vascular congestion consistent with mild CHF. Tubes and lines as described. Electronically Signed   By: Alcide Clever M.D.   On: 03/27/2023 21:58     Imaging independently reviewed in Epic.    Vedia Coffer for Infectious Disease [facility] [phone] pager 03/29/2023, 11:26 AM  I have personally spent 50 minutes involved in face-to-face and non-face-to-face activities for this patient on the day of the visit. Professional time spent includes the following activities: Preparing to see the patient (review of tests), Obtaining and/or reviewing separately obtained history (admission/discharge record), Performing a medically appropriate examination and/or evaluation , Ordering medications/tests/procedures, referring and communicating with other health care professionals, Documenting clinical information in the EMR, Independently interpreting results (not separately reported), Communicating results to the patient/family/caregiver, Counseling and educating the patient/family/caregiver and Care coordination (not separately reported).

## 2023-03-29 NOTE — Telephone Encounter (Signed)
Left message on machine to call back to see if she wanted to scheduled for her [place].  She will need to speak with me before scheduling.

## 2023-03-30 ENCOUNTER — Inpatient Hospital Stay (HOSPITAL_COMMUNITY): Payer: PRIVATE HEALTH INSURANCE

## 2023-03-30 DIAGNOSIS — M4624 Osteomyelitis of vertebra, thoracic region: Secondary | ICD-10-CM | POA: Diagnosis not present

## 2023-03-30 DIAGNOSIS — G928 Other toxic encephalopathy: Secondary | ICD-10-CM | POA: Diagnosis not present

## 2023-03-30 DIAGNOSIS — Z515 Encounter for palliative care: Secondary | ICD-10-CM | POA: Diagnosis not present

## 2023-03-30 HISTORY — PX: IR REMOVAL TUN CV CATH W/O FL: IMG2289

## 2023-03-30 LAB — RENAL FUNCTION PANEL
Albumin: 3.3 g/dL — ABNORMAL LOW (ref 3.5–5.0)
Anion gap: 15 (ref 5–15)
BUN: 36 mg/dL — ABNORMAL HIGH (ref 8–23)
CO2: 23 mmol/L (ref 22–32)
Calcium: 8.3 mg/dL — ABNORMAL LOW (ref 8.9–10.3)
Chloride: 90 mmol/L — ABNORMAL LOW (ref 98–111)
Creatinine, Ser: 5.15 mg/dL — ABNORMAL HIGH (ref 0.44–1.00)
GFR, Estimated: 9 mL/min — ABNORMAL LOW (ref >60)
Glucose, Bld: 90 mg/dL (ref 70–99)
Phosphorus: 4.5 mg/dL (ref 2.5–4.6)
Potassium: 4 mmol/L (ref 3.5–5.1)
Sodium: 128 mmol/L — ABNORMAL LOW (ref 135–145)

## 2023-03-30 LAB — HEPATIC FUNCTION PANEL
ALT: 18 U/L (ref 0–44)
AST: 24 U/L (ref 15–41)
Albumin: 3.2 g/dL — ABNORMAL LOW (ref 3.5–5.0)
Alkaline Phosphatase: 121 U/L (ref 38–126)
Bilirubin, Direct: 0.4 mg/dL — ABNORMAL HIGH (ref 0.0–0.2)
Indirect Bilirubin: 1.7 mg/dL — ABNORMAL HIGH (ref 0.3–0.9)
Total Bilirubin: 2.1 mg/dL — ABNORMAL HIGH (ref 0.3–1.2)
Total Protein: 7.2 g/dL (ref 6.5–8.1)

## 2023-03-30 LAB — GLUCOSE, CAPILLARY: Glucose-Capillary: 95 mg/dL (ref 70–99)

## 2023-03-30 LAB — CULTURE, BLOOD (ROUTINE X 2)

## 2023-03-30 MED ORDER — SODIUM CHLORIDE 0.9 % IV SOLN
8.0000 mg/kg | INTRAVENOUS | Status: DC
  Filled 2023-03-30: qty 13

## 2023-03-30 MED ORDER — OXYCODONE HCL ER 10 MG PO T12A
10.0000 mg | EXTENDED_RELEASE_TABLET | Freq: Two times a day (BID) | ORAL | Status: DC
  Administered 2023-03-31 (×2): 10 mg via ORAL
  Filled 2023-03-30 (×3): qty 1

## 2023-03-30 MED ORDER — DIAZEPAM 5 MG PO TABS
2.5000 mg | ORAL_TABLET | Freq: Two times a day (BID) | ORAL | Status: DC | PRN
  Administered 2023-03-31: 2.5 mg via ORAL
  Filled 2023-03-30: qty 1

## 2023-03-30 MED ORDER — PREGABALIN 50 MG PO CAPS
50.0000 mg | ORAL_CAPSULE | Freq: Every day | ORAL | Status: DC
  Administered 2023-03-31 – 2023-04-03 (×2): 50 mg via ORAL
  Filled 2023-03-30 (×3): qty 1

## 2023-03-30 MED ORDER — METHOCARBAMOL 500 MG PO TABS
500.0000 mg | ORAL_TABLET | Freq: Three times a day (TID) | ORAL | Status: DC
  Administered 2023-03-31 – 2023-04-03 (×6): 500 mg via ORAL
  Filled 2023-03-30 (×9): qty 1

## 2023-03-30 NOTE — Progress Notes (Addendum)
PROGRESS NOTE   [name] [name]  ZOX:[id number]    DOB: 1956-02-19    DOA: 02/23/2023  PCP: Donato Schultz, DO   I have briefly reviewed patients previous medical records in [facility].  Chief Complaint  Patient presents with   Abdominal Pain   Back Pain    Brief Narrative:   67 year old who is moslty wheelchair bound with PMH of CKD stage V, hypertension, cirrhosis of the liver, OSA, morbid obesity, type II DM with neuropathy, hypothyroidism, CAD, PTSD/depression, L1 osteomyelitis status post L1 corpectomy, T10-T11 discitis osteomyelitis status post posterior arthrodesis of T8-T11 and removal of T11 pedicle screws and rod in 12/23, in April 2024, diagnosed with T4-T5 discitis osteomyelitis which was being treated with daptomycin, Neurosurgery did not recommend any further interventions, presented to the hospital on 02/25/2023 for worsening back pain.  This admission diagnosed with recurrent discitis/osteomyelitis, initial acute on chronic kidney disease which has since progressed to new ESRD and on HD.  Due to ongoing intermittent high fevers, ID was reconsulted on 5/29.  Nephrology following for HD needs.  Palliative care consulted for pain management.  5/15, patient had a fall in the OR while being transferred from the OR table.  For details of hospital course, please refer to prior IM provider notes including progress note by Dr. Ramiro Harvest from 03/27/2023.    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  She has had extensive history of discitis/osteomyelitis, surgical intervention, antibiotic treatment etc. prior to this admission.  Please refer to ID follow-up progress note from 03/28/2023 for details. MRI T-spine 4/30 showed persistent T4-T5 discitis/osteomyelitis with mild interval collapse of the T4 vertebral body.  Also had interval increase in size of suspected dorsal epidural fluid collection measuring 7 mm, possibly abscess or phlegmon. Blood cultures x 2 from 5/24 & 5/29: Negative.  Fine-needle aspirate culture from intervertebral disc T4-5 from 5/3: Negative.  MRSA PCR negative. On IV daptomycin since 4/30. Noted ongoing fevers up to 103 F yesterday.  Thereby consulted ID again and their input much appreciated.  Etiology of these fevers unclear but has multiple source possibilities as per their note.  Chest x-ray 5/28 unremarkable.  UA not indicative of UTI.  Fistula site does not appear infected.  CRP 18.5 (  1 on 4/30) and ESR 82 (74 on 4/30).  Surveillance blood cultures from 5/29: NTD.  Continuing daptomycin for now and following clinically.  If fever recurs, then per ID, may need reimaging of her back versus empiric HD line removal/holiday.  Addendum: Developed fever of 102.9 F after returning  from HD on 5/31.  Extensively communicated with ID and nephrology, decision made to remove central line after placing a peripheral IV, ID did not recommend sending CBC tip for culture.  Also some LOC vancomycin to preserve the HD catheter.  Daptomycin being changed to across HD.  New ESRD Initially presented with acute kidney injury complicating stage V CKD which has since progressed to ESRD.  Nephrology following. TDC placed 03/16/2023 & AVF 03/05/2023 by vascular surgery. Undergoing HD on MWF schedule.  Has been clipped to outpatient HD. See concerns regarding fever and line infection as noted above. As per discussion with TOC, patient's insurance would likely not cover SNF even if recommended.  Nephrology trying to see if patient can set up for HD which she would need to be able to do prior to, unable to do this morning due to mental status changes, will attempt again on 6/3. Please refer to detailed discussion by TOC on 5/30 regarding discharge disposition.  It appears that patient has good support at home from spouse when he is not working, caregivers 5 days a week for up to 5 hours/day, neighbors close by per patient report, all DME's and both patient and spouse and they feel safe returning home.  Acute on chronic pain: Due to recurrent osteomyelitis.  PTA, patient noted to be on Norco, Valium and Lyrica. Palliative care consulted for symptom management. Pain appears to be reasonably controlled on current multimodality pain regimen which includes OxyContin, as needed oxycodone and others. Outpatient follow-up with PCP/referral to pain clinic by PCP.  As per MDs advice, TOC has discussed in detail with patient/spouse that they are to follow-up with their PCP for opioid refills which will be filled only for a few days at time of discharge. Due to mental status changes, suspected due to polypharmacy, trying to minimize some of the meds.  Reduced Robaxin from 750 Mg to 500 Mg 3 times daily, Oxy IR to  5-10 Mg every 8 hours as needed for moderate or severe pain, OxyContin to 10 Mg twice daily, Lyrica to 50 Mg daily and Valium 2.5 Mg every 12 hours as needed.  Discontinued lidocaine patch.  Holding this morning's pain meds.  Controlled 2 DM with ESRD: A1c 4.6 and 4.7 this admission.  Early on admission had hypoglycemic episodes.  Off all insulins.  Also legally blind in both eyes, left greater than sign right.  CBGs last evening and this morning are okay.  No hypoglycemia.  Diarrhea/constipation: Workup revealed C. difficile antigen positive but toxigenic PCR negative.  Got Questran.  Then had constipation treated with Dulcolax suppository, MiraLAX, sorbitol, soapsuds enemas.  Continue bowel regimen.  Improved.  Last BM 5/29.  Urinary retention: Required Foley catheter earlier on in hospitalization.  Foley catheter eventually removed on 5/20.  Voiding without difficulty.  Normocytic anemia: Likely multifactorial due to anemia of chronic disease, ESRD.  S/p multiple PRBC transfusions this admission.  Has received IV iron. Continue ESA and monitor CBCs.  Transfuse for hemoglobin 7 g or less.  Periodic monitoring of hemoglobin across dialysis.  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  Stable.  No bleeding reported.  Platelet counts had normalized on 5/29 and 5/30.  Chronic  diastolic CHF: Volume management across dialysis.  Lasix discontinued.  Clinically euvolemic, lungs clear to auscultation so etiology of dyspnea not really clear.  Hypertension/hypotension: Continue midodrine pre-HD for intradialytic hypotension.  Depression/anxiety:  Continue Cymbalta/diazepam.  Has been getting infrequent doses of as needed Valium and even missed several days.  Given her recurrent AMS, reduced Valium from 5 mg to 2.5 mg every 12 hours as needed for anxiety.  Chronic lower extremity DVTs: On Eliquis.  Fall in the OR: Sustained during transfer from the OR table to the stretcher.  CT C/A/P with  no acute trauma.  CT head negative.  Plain films of left shoulder negative for any fracture.  PT and OT have evaluated.  Hyponatremia, hypervolemic:  Managed with dialysis.  Fluid restriction.  Stable.   Hypokalemia:   managed with higher potassium bath.  Chest pain/dyspnea Noted 5/24.  Some EKG changes.  Cardiac enzymes flat.  Chest x-ray with no acute infiltrate.  TTE showed normal EF with no wall motion abnormalities and grade 1 diastolic dysfunction.  Chest pain resolved.  Atypical chest pain, suspect musculoskeletal.  Altered mental status/acute toxic metabolic encephalopathy: Per nursing report, has had issues with intermittent confusion which was also verified with patient's spouse.  Since 5/30 afternoon, seems to be more altered for unclear reasons.  CBGs checked and no hypoglycemia.  VBG without significant CO2 retention.  Remains on IV antibiotics for osteomyelitis/discitis, follow-up blood cultures negative.  No other obvious active source of infection.  Left leg wound appears without acute findings.  Checking CT head (although suspect low yield without focal deficits), ammonia levels.  LFTs unremarkable.  Suspect polypharmacy as the main etiology.  Reduced OxyContin 10 Mg twice daily, Robaxin from 750 Mg to 500 Mg 3 times daily, Oxy IR to 5-10 Mg every 8 hours as needed for moderate or severe pain, Valium from 5-2.5 Mg every 12 hours as needed & Lyrica to 50 mg daily.  Discontinued lidocaine patch (likely not helping much).  Despite not having gotten most of her pain meds since yesterday morning, ongoing mental status changes,?  Decreased renal clearance.  Will see how she does after dialysis this morning.  Holding this morning's pain meds.  Discussed in detail with patient's RN at bedside.  Return from HD 5/31 with fever of 102.9 rectally, no significant change in mental status per nursing.  Not suspect that her AMS is multifactorial due to infectious etiology/fevers, polypharmacy  complicating ESRD.  Chronic respiratory failure with hypoxia: More dyspneic since last night into this a.m.  Mild breathing.  I did not notice accessory muscle use.  Clinically does not appear volume overloaded.  Getting chest x-ray.  Going for dialysis this morning.  Monitor.  Already on anticoagulation  Body mass index is 34.87 kg/m.  Nutritional Status Nutrition Problem: Increased nutrient needs Etiology: acute illness, catabolic illness (acute OM of thoracic spine, new ESRD requiring HD) Signs/Symptoms: estimated needs Interventions: MVI, Nepro shake, Education  Pressure Ulcer: Pressure Injury 02/20/2023 Heel Left (Active)  02/03/2023 1826  Location: Heel  Location Orientation: Left  Staging:   Wound Description (Comments):   Present on Admission: Yes  Dressing Type Foam - Lift dressing to assess site every shift;Gauze (Comment);Other (Comment) 03/30/23 0735  Continue current wound care as per [facility] RN recommendations.  ACP Documents: None present DVT prophylaxis: apixaban (ELIQUIS) tablet 2.5 mg Start: 03/15/23 1145 Place and maintain sequential compression device Start: 03/01/23 1536     Code Status: Full Code:  Family Communication: None at  bedside this morning. Disposition:  Ongoing altered mental status, SOB.  Remains inpatient appropriate.     Consultants:   ID: Dr. [name] 02/10/2023 Wound care RN IR: Dr.De Melchor Amour 02/28/2023 Nephrology: Dr. Glenna Fellows 03/01/2023 Vascular surgery: Dr. Randie Heinz 03/11/2023 Palliative care: Dr. Linna Darner 03/13/2023  Procedures:   Placement of tunneled left-sided hemodialysis catheter: Per IR: Dr. Fredia Sorrow 03/13/2023 Placement of left jugular nontunneled dialysis catheter using ultrasound and fluoroscopic guidance: Per IR, Dr. Lowella Dandy 03/02/2023 Fluoroscopy guided T5 core biopsy and T4-5 disc aspiration: Per Dr.Katyucia, IR, 03/02/2023 Placement of left jugular type lumen dialysis catheter: Per IR, Dr. Lowella Dandy 03/02/2023 Left brachiocephalic AV fistula  placement: Per vascular surgery: Dr. Chestine Spore 03/10/2023 Ultrasound-guided access left internal jugular vein, placement of left internal jugular vein tunneled dialysis catheter: Per Dr. Chestine Spore 03/16/2023  Antimicrobials:   As noted above.   Subjective:  Overnight events noted.  Rapid response activated for lethargy, difficult to arouse and "using accessory muscles to breathe".  However RRT found patient without visible distress.  Seen this morning along with patient's RN at bedside.  Patient is alert and oriented to self and place but otherwise remains confused but not agitated.  Follows simple instructions. , Objective:   Vitals:   03/30/23 0835 03/30/23 0900 03/30/23 0930 03/30/23 1000  BP: (!) 120/56 (!) 103/45 (!) 85/49 108/70  Pulse: 98  98 99  Resp: 18  16 18   Temp:      TempSrc:      SpO2: 100% 100% 100% 100%  Weight:      Height:        General exam: Middle-age female, small built and frail, chronically ill looking, lying supine in bed, mouth breathing, has full facemask due to mouth breathing other than a nasal cannula, does not appear in respiratory distress. Eyes: Legally blind in both eyes.  Bilateral IOLs present. PERTLA Respiratory system: Clear to auscultation without wheezing, rhonchi or crackles.  No breathing.  No accessory muscles noted.. Cardiovascular system: S1 and S2 heard, RRR.  No JVD, murmurs or pedal edema.. Gastrointestinal system: Abdomen is nondistended, soft and nontender. No organomegaly or masses felt. Normal bowel sounds heard. Central nervous system: Mental test as noted above.  No focal neurological deficits. Extremities: Symmetric 5 x 5 power. Skin: Left heel ulcer as noted in picture below, clean and without acute findings or drainage.  No wounds on left foot. Psychiatry: Judgement and insight appear normal. Mood & affect appropriate. Lines: As per ID on 5/30, "right IJ CVC in place no warmth or tenderness. Left [place] HD catheter with no warmth or  tenderness. Left AV fistula site looks okay."   Left heel ulcer, picture from 03/30/2023.   Data Reviewed:   I have personally reviewed following labs and imaging studies   CBC: Recent Labs  Lab 03/23/23 1635 03/24/23 0306 03/25/23 0321 03/26/23 0950 03/26/23 1620 03/28/23 0314 03/29/23 1552  WBC 3.5* 3.4*   < > 3.7* 4.8 8.6 6.8  NEUTROABS 3.2 2.4  --  2.7  --   --   --   HGB 8.5* 8.1*   < > 7.7* 8.4* 7.8* 7.6*  HCT 26.2* 25.2*   < > 23.7* 26.2* 24.6* 24.3*  MCV 88.8 90.3   < > 89.1 91.3 89.8 90.3  PLT 91* 103*   < > 102* 104* 161 190   < > = values in this interval not displayed.    Basic Metabolic Panel: Recent Labs  Lab 03/27/23 0400 03/28/23 0314 03/29/23 0337 03/29/23 1552  03/30/23 0305  NA 128* 127* 130* 129* 128*  K 3.6 3.7 3.4* 3.6 4.0  CL 90* 91* 90* 90* 90*  CO2 26 24 27 26 23   GLUCOSE 62* 139* 101* 75 90  BUN 28* 43* 25* 30* 36*  CREATININE 3.47* 4.72* 3.69* 4.36* 5.15*  CALCIUM 7.2* 7.8* 8.4* 8.6* 8.3*  PHOS 4.0 4.9* 3.9 4.1 4.5    Liver Function Tests: Recent Labs  Lab 03/28/23 0314 03/29/23 0337 03/29/23 1552 03/30/23 0305 03/30/23 0834  AST  --   --   --   --  24  ALT  --   --   --   --  18  ALKPHOS  --   --   --   --  121  BILITOT  --   --   --   --  2.1*  PROT  --   --   --   --  7.2  ALBUMIN 2.8* 3.4* 3.4* 3.3* 3.2*    CBG: Recent Labs  Lab 03/29/23 1455 03/29/23 2208 03/30/23 0751  GLUCAP 91 74 95    Microbiology Studies:   Recent Results (from the past 240 hour(s))  Culture, blood (Routine X 2) w Reflex to ID Panel     Status: None   Collection Time: 03/23/23  4:22 PM   Specimen: BLOOD  Result Value Ref Range Status   Specimen Description BLOOD RIGHT ANTECUBITAL  Final   Special Requests   Final    BOTTLES DRAWN AEROBIC AND ANAEROBIC Blood Culture adequate volume   Culture   Final    NO GROWTH 5 DAYS Performed at [facility], [address] [place], [place] [address]    Report Status 03/28/2023 FINAL   Final  Culture, blood (Routine X 2) w Reflex to ID Panel     Status: None   Collection Time: 03/23/23  4:52 PM   Specimen: BLOOD RIGHT HAND  Result Value Ref Range Status   Specimen Description BLOOD RIGHT HAND  Final   Special Requests   Final    BOTTLES DRAWN AEROBIC ONLY Blood Culture adequate volume   Culture   Final    NO GROWTH 5 DAYS Performed at [facility], [address], [place], [place] [address]    Report Status 03/28/2023 FINAL  Final  Culture, blood (Routine X 2) w Reflex to ID Panel     Status: None (Preliminary result)   Collection Time: 03/28/23  2:07 PM   Specimen: BLOOD  Result Value Ref Range Status   Specimen Description BLOOD RIGHT ANTECUBITAL  Final   Special Requests   Final    BOTTLES DRAWN AEROBIC AND ANAEROBIC Blood Culture results may not be optimal due to an inadequate volume of blood received in culture bottles   Culture   Final    NO GROWTH 2 DAYS Performed at [facility], [address], [place], [place] [address]    Report Status PENDING  Incomplete  Culture, blood (Routine X 2) w Reflex to ID Panel     Status: None (Preliminary result)   Collection Time: 03/28/23  2:09 PM   Specimen: BLOOD  Result Value Ref Range Status   Specimen Description BLOOD RIGHT ANTECUBITAL  Final   Special Requests   Final    BOTTLES DRAWN AEROBIC AND ANAEROBIC Blood Culture results may not be optimal due to an inadequate volume of blood received in culture bottles   Culture   Final    NO  GROWTH 2 DAYS Performed at [facility], [address] [place], [place] [address]    Report Status PENDING  Incomplete    Radiology Studies:  No results found.  Scheduled Meds:    acetaminophen  1,000 mg Oral TID   acidophilus  2 capsule Oral TID   apixaban  2.5 mg Oral BID   calcitRIOL  0.5 mcg Oral Daily   calcium carbonate  1 tablet Oral Daily   Chlorhexidine Gluconate Cloth  6 each Topical Q0600   darbepoetin (ARANESP) injection - NON-DIALYSIS   100 mcg Subcutaneous Q Fri-1800   diclofenac  1 patch Transdermal BID   DULoxetine  60 mg Oral Daily   feeding supplement (NEPRO CARB STEADY)  237 mL Oral TID BM   lidocaine  2 patch Transdermal Q24H   loratadine  10 mg Oral Daily   methocarbamol  500 mg Oral TID   midodrine  10 mg Oral Q M,W,F-HD   mometasone-formoterol  2 puff Inhalation BID   multivitamin  1 tablet Oral QHS   oxyCODONE  15 mg Oral Q12H   polyethylene glycol  17 g Oral BID   pregabalin  75 mg Oral Daily   senna-docusate  1 tablet Oral BID   sodium chloride flush  10-40 mL Intracatheter Q12H   sodium chloride flush  3 mL Intravenous Q12H    Continuous Infusions:    DAPTOmycin (CUBICIN) 650 mg in sodium chloride 0.9 % IVPB 650 mg (03/29/23 1703)     LOS: 31 days     Marcellus Scott, MD,  FACP, FHM, SFHM, [facility], [facility]   [facility] [place]     To contact the attending provider between 7A-7P or the covering provider during after hours 7P-7A, please log into the web site [email or web address] and access using universal [place] password for that web site. If you do not have the password, please call the hospital operator.  03/30/2023, 10:21 AM

## 2023-03-30 NOTE — Progress Notes (Addendum)
Palliative Medicine Inpatient Follow Up Note HPI: [name] [name] is a 67 y.o. adult with medical history significant of CKD 5,diabetes, neuropathy, thrombocytopenia, OSA, osteomyelitis, diabetic foot infections, anemia, hypothyroidism, hypertension, hyperlipidemia, CAD, cirrhosis, asthma, spinal stenosis, status post lumbar fusion, status post renal transplant (age 39), PTSD, depression, anxiety, obesity, fibromyalgia, DVT, cataracts presenting with worsening back pain, nausea, vomiting. She presented to the hospital for back pain, abdominal pain, nausea, and vomiting and was admitted on 02/01/2023.   Palliative Medicine was consulted for goals of care and pain management.  Today's Discussion 03/30/2023  *Please note that this is a verbal dictation therefore any spelling or grammatical errors are due to the "Dragon [employer] One" system interpretation.  Chart reviewed inclusive of vital signs, progress notes, laboratory results, and diagnostic images. T max is in the last 24 hours 99.0. Noted to be acidotic.  Patient had an RR overnight due to lethargy. Per discussion with patients RN Waldon Merl plan for HD this morning. Patient has not desired medication(s) for pain this morning.   I met with [name] [name] this morning.  She was noted to be quite somnolent this morning. She would arouse though was not certain of where she why nor why she was here. I tried to provide comprehensive re-orientation. Discussed that she will receive dialysis this morning.   Have alerted primary team to patients mental status change.   Questions and concerns addressed/Palliative Support Provided.   Objective Assessment: Vital Signs Vitals:   03/30/23 0835 03/30/23 0900  BP: (!) 120/56 (!) 103/45  Pulse:    Resp:    Temp:    SpO2: 100% 100%    Intake/Output Summary (Last 24 hours) at 03/30/2023 0911 Last data filed at 03/29/2023 1642 Gross per 24 hour  Intake 28 ml  Output --  Net 28 ml    Last Weight  Most  recent update: 03/30/2023  4:42 AM    Weight  79.1 kg (174 lb 6.1 oz)            Gen:  Elderly F in NAD HEENT: dry mucous membranes CV: Regular rate and rhythm PULM: On 4LPM simple FM, resp state is even and unlabored ABD: soft/nontender  EXT: No edema  Neuro: Somnolent - disoriented  SUMMARY OF RECOMMENDATIONS   Full Code / Full scope of care  Delirium precautions  Symptoms at below:  Chronic Pain: -Continue Tylenol 1000 mg tid -Continue diclofenac (Flector) 1.3% patch bid -Continue lidocaine patch 5% 2 patches daily -Modified Robaxin 500mg  tid -Oxycontin 15mg  PO BID -Modified Oxycodone 5-10mg  PO Q8H PRN moderate/severe pain -Continue Duloxetine (Cymbalta) 60mg  daily -Continue Pregabalin (Lyrica) 75mg  daily -Kpad to back as tolerated  Delirium Precautions: -Non-pharmacologic Delirium Precautions  - Frequent re-orientation; update board w/ date, names of treatment team  - Daytime stimulation: Open curtains, turn lights on, and turn on television as tolerated during waking hours  - Nighttime calm - close curtains, turn lights off, and turn off television at 9pm  with minimal interruptions from treatment team during sleeping hours, including avoiding lab draws if possible  - Encourage continued presence of family/friends  - Occupy w/ distractions - e.g. ask them to fold washcloths, busy-board  - Encourage movement with PT/OT as tolerated  - Ensure adequate sensorium, to include providing glasses, dentures, and hearing aids to patient as appropriate  - Ensure adequate nutrition and fluid intake  - Monitor for regular bowel movements  Billing based on MDM: High ______________________________________________________________________________________ Lamarr Lulas [facility] Palliative Medicine Team  Team Cell Phone: [phone] Please utilize secure chat with additional questions, if there is no response within 30 minutes please call the above phone  number  Palliative Medicine Team providers are available by phone from 7am to 7pm daily and can be reached through the team cell phone.  Should this patient require assistance outside of these hours, please call the patient's attending physician.

## 2023-03-30 NOTE — Progress Notes (Signed)
[facility]  Date of Admission:  02/02/2023           Reason for visit: Follow up on Fevers, Discitis/OM   Current antibiotics: Daptomycin  ASSESSMENT:    67 y.o. adult admitted with:  Discitis/osteomyelitis: Patient with extensive history of infection in her back with known thoracolumbar discitis/osteomyelitis and instrumentation.  Previously, in March 2021 she was treated for thoracolumbar infection due to Pseudomonas. However, more recent cultures have yielded MRSE with T10-T11 discitis with erosion requiring revision and extension of fusion to T8 in August 2023.  Imaging in August 2023 also noted early discitis/OM of T4-5.  She was supposed to be on doxycycline suppression but did not tolerate this and subsequently stopped antibiotics after IV therapy in October 2023.  She has subsequently had progression of infection at T4-5 noted on MRI in March 2024 with aspiration again yielding MRSE and currently on a prolonged course of daptomycin initially with an anticipated end date of 03/30/23.  However, she has been readmitted with worsening back pain and MRI showing new osteomyelitis at T3 with vertebral collapse of T4 since her previous MRI in March.  She had repeat aspiration 03/02/23 which was negative.  Unclear if this MRI finding on 02/27/23 was new infection vs radiographic lag.  Nonetheless, her daptomycin was extended an additional 2 weeks through 04/13/23 by her primary ID provider, Dr Thedore Mins with plan for oral suppression thereafter. 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 Fevers: Unclear source of fevers that have developed x 2 on 5/24 and 5/28.  Afebrile since that time but axillary temp at HD 99 degrees F. Encephalopathy, chronic respiratory failure: Noted rapid response called last night.   RECOMMENDATIONS:    Her  repeat blood cultures remain negative but not terribly surprising given she has been on daptomycin for several weeks She is having intradialytic hypotension again of unclear etiology.  Again, not sure if line is colonized with bacteria that then get stirred up with HD Her axillary temp is 99.  Discussed with HD RN about rectal temp but can only do oral temp if she is more awake later Will try to get rectal temp on patient when back to the floor Following  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)   IDA (iron deficiency anemia)   PTSD (post-traumatic stress disorder)   Hypertension   High cholesterol   GERD (gastroesophageal reflux disease)   Fibromyalgia   Depression   Asthma   Anxiety   Anemia   Hypothyroidism   CKD (chronic kidney disease) stage 5, GFR less than 15 ml/min (HCC)   Osteomyelitis (HCC)   Chronic diastolic CHF (congestive heart failure) (HCC)   Thrombocytopenia (HCC)   ESRD on dialysis (HCC)  Fever   Chronic thoracic back pain    MEDICATIONS:    Scheduled Meds:  acetaminophen  1,000 mg Oral TID   acidophilus  2 capsule Oral TID   apixaban  2.5 mg Oral BID   calcitRIOL  0.5 mcg Oral Daily   calcium carbonate  1 tablet Oral Daily   Chlorhexidine Gluconate Cloth  6 each Topical Q0600   darbepoetin (ARANESP) injection - NON-DIALYSIS  100 mcg Subcutaneous Q Fri-1800   diclofenac  1 patch  Transdermal BID   DULoxetine  60 mg Oral Daily   feeding supplement (NEPRO CARB STEADY)  237 mL Oral TID BM   lidocaine  2 patch Transdermal Q24H   loratadine  10 mg Oral Daily   methocarbamol  500 mg Oral TID   midodrine  10 mg Oral Q M,W,F-HD   mometasone-formoterol  2 puff Inhalation BID   multivitamin  1 tablet Oral QHS   oxyCODONE  15 mg Oral Q12H   polyethylene glycol  17 g Oral BID   pregabalin  75 mg Oral Daily   senna-docusate  1 tablet Oral BID   sodium chloride flush  10-40 mL Intracatheter Q12H   sodium chloride flush  3 mL Intravenous Q12H   Continuous Infusions:  DAPTOmycin (CUBICIN) 650 mg in sodium chloride 0.9 % IVPB 650 mg (03/29/23 1703)   PRN Meds:.albuterol, alteplase, diazepam, heparin, hydrALAZINE, lidocaine (PF), lidocaine-prilocaine, naLOXone (NARCAN)  injection, nitroGLYCERIN, ondansetron (ZOFRAN) IV, mouth rinse, oxyCODONE, pentafluoroprop-tetrafluoroeth  SUBJECTIVE:   24 hour events:  Rapid response due to difficulty arousing patient, but back to her baseline by the time of their evaluation Afebrile, Tmax 98.7 Vitals otherwise appear stable No new imaging Repeat blood cx NGTD  Patient seen this morning on HD unit. She is more alert.  States her back is "doing pretty good" today but then has some incoherent speech afterwards. She is on 5 liters via Face mask.  VBG reassuring. Axillary temp 99.  Pressures low on HD. Discussed with HD RN at bedside. Lines look okay.  Review of Systems  All other systems reviewed and are negative.     OBJECTIVE:   Blood pressure (!) 129/53, pulse (!) 106, temperature 98.7 F (37.1 C), temperature source Oral, resp. rate 15, height 4\' 11"  (1.499 m), weight 79.1 kg, SpO2 92 %. Body mass index is 35.22 kg/m.  Physical Exam Constitutional:      Comments: Chronically ill appearing woman. Lying in HD.  On face mask.    Eyes:     Extraocular Movements: Extraocular movements intact.     Conjunctiva/sclera:  Conjunctivae normal.  Cardiovascular:     Rate and Rhythm: Normal rate and regular rhythm.  Pulmonary:     Effort: Pulmonary effort is normal. No respiratory distress.  Abdominal:     General: There is distension.     Palpations: Abdomen is soft.     Tenderness: There is no abdominal tenderness. There is no guarding.  Musculoskeletal:     Cervical back: Normal range of motion and neck supple.     Right lower leg: No edema.     Left lower leg: No edema.     Comments: Her right and left central lines look okay.   Skin:    General: Skin is warm and dry.     Findings: No rash.  Neurological:     General: No focal deficit present.     Cranial Nerves: No cranial nerve deficit.     Comments: She is alert and  conversant followed by incoherent speech.   Psychiatric:        Mood and Affect: Mood normal.        Behavior: Behavior normal.      Lab Results: Lab Results  Component Value Date   WBC 6.8 03/29/2023   HGB 7.6 (L) 03/29/2023   HCT 24.3 (L) 03/29/2023   MCV 90.3 03/29/2023   PLT 190 03/29/2023    Lab Results  Component Value Date   NA 128 (L) 03/30/2023   K 4.0 03/30/2023   CO2 23 03/30/2023   GLUCOSE 90 03/30/2023   BUN 36 (H) 03/30/2023   CREATININE 5.15 (H) 03/30/2023   CALCIUM 8.3 (L) 03/30/2023   GFRNONAA 9 (L) 03/30/2023   GFRAA 31 (L) 06/16/2020    Lab Results  Component Value Date   ALT 9 03/19/2023   AST 14 (L) 03/19/2023   ALKPHOS 118 03/19/2023   BILITOT 1.2 03/19/2023       Component Value Date/Time   CRP 18.5 (H) 03/28/2023 1407       Component Value Date/Time   ESRSEDRATE 82 (H) 03/28/2023 1407     I have reviewed the micro and lab results in Epic.  Imaging: No results found.   Imaging independently reviewed in Epic.    Vedia Coffer for Infectious Disease [facility] [phone] pager 03/30/2023, 6:02 AM  I have personally spent 50 minutes involved in face-to-face and non-face-to-face  activities for this patient on the day of the visit. Professional time spent includes the following activities: Preparing to see the patient (review of tests), Obtaining and/or reviewing separately obtained history (admission/discharge record), Performing a medically appropriate examination and/or evaluation , Ordering medications/tests/procedures, referring and communicating with other health care professionals, Documenting clinical information in the EMR, Independently interpreting results (not separately reported), Communicating results to the patient/family/caregiver, Counseling and educating the patient/family/caregiver and Care coordination (not separately reported).

## 2023-03-30 NOTE — Progress Notes (Signed)
   03/30/23 1212  Vitals  Temp 98.6 F (37 C)  Pulse Rate (!) 114  Resp (!) 31  BP (!) 138/55  SpO2 98 %  O2 Device Simple Mask  Weight  (unabletoweigh)  Oxygen Therapy  O2 Flow Rate (L/min) 5 L/min  Patient Activity (if Appropriate) In bed  Pulse Oximetry Type Continuous  Oximetry Probe Site Changed No  Post Treatment  Dialyzer Clearance Lightly streaked  Duration of HD Treatment -hour(s) 3.3 hour(s)  Hemodialysis Intake (mL) 100 mL  Liters Processed 72.8  Fluid Removed (mL) 600 mL  Tolerated HD Treatment Yes   Received patient in bed to unit.  Alert and oriented.  Informed consent signed and in chart.   TX duration:3.5 See progress notes Transported back to the room  Alert, without acute distress.  Hand-off given to patient's nurse.   Access used: [facility] Access issues: no complications  Total UF removed: Medication(s) given: none    [facility]

## 2023-03-30 NOTE — Progress Notes (Signed)
[facility] NEPHROLOGY PROGRESS NOTE  Assessment/ Plan:  # New ESRD: prolonged CKD now progressed to ESRD. TDC placement 03/11/2023, appreciate assistance - CLIP to AF MWF 1145 (arrive 1115am 1st day) - Appreciate lt BCF by Dr. Chestine Spore on 5/15 -> positive thrill -s/p HD on 5/27 and 5/29 had intradialytic hypotension required albumin and midodrine.   for next HD on 5/31/ today. - Continue MWF schedule.  I still have some concerns about her suitability for OP HD- her mobility seems not good and now confusion-  will need to see if can sit up in chair for HD will plan for now Monday -   # Anemia: Continue ESA and monitor hemoglobin.  Required blood transfusion..iron was OK.   Hgb low but stable  # CKD-  Has not needed binder. Continue calcitriol for secondary hyperparathyroidism.  # HTN/volume:   Continue midodrine pre-HD for intradialytic hypotension.  Manage volume with HD. I think now close to EDW.  UF as able. BP other than in HD is OK -  will change midodrine to pre HD only   # Osteomyelitis plan on daptomycin.  Last dose on 6/14.  # Hyponatremia, hypervolemic: Managed with dialysis/   fluid restriction.  # Acute febrile illness: Chest x-ray negative.  On antibiotics for OM.  Blood culture negative so far.  Afebrile now for 48 hours      # Hypokalemia:  managed with higher potassium bath.  Subjective:  events noted yesterday-  confused and inc wob-  had visit from rapid response-  unclear etiology -  were going to do HD in chair today but seems too unstable-  has O2 mask on today -   asking me how I am but then  some incoherent speech after-  seen in HD    Objective Vital signs in last 24 hours: Vitals:   03/30/23 0735 03/30/23 0824 03/30/23 0835 03/30/23 0900  BP: 130/60 (!) 107/53 (!) 120/56 (!) 103/45  Pulse: 98 97    Resp: 16 16    Temp: 98.9 F (37.2 C) 99 F (37.2 C)    TempSrc:  Axillary    SpO2: 95% 100% 100% 100%  Weight:      Height:       Weight  change: -3.9 kg  Intake/Output Summary (Last 24 hours) at 03/30/2023 0910 Last data filed at 03/29/2023 1642 Gross per 24 hour  Intake 28 ml  Output --  Net 28 ml       Labs: RENAL PANEL Recent Labs  Lab 03/27/23 0400 03/28/23 0314 03/29/23 0337 03/29/23 1552 03/30/23 0305  NA 128* 127* 130* 129* 128*  K 3.6 3.7 3.4* 3.6 4.0  CL 90* 91* 90* 90* 90*  CO2 26 24 27 26 23   GLUCOSE 62* 139* 101* 75 90  BUN 28* 43* 25* 30* 36*  CREATININE 3.47* 4.72* 3.69* 4.36* 5.15*  CALCIUM 7.2* 7.8* 8.4* 8.6* 8.3*  PHOS 4.0 4.9* 3.9 4.1 4.5  ALBUMIN 2.8* 2.8* 3.4* 3.4* 3.3*    Liver Function Tests: Recent Labs  Lab 03/29/23 0337 03/29/23 1552 03/30/23 0305  ALBUMIN 3.4* 3.4* 3.3*   No results for input(s): "LIPASE", "AMYLASE" in the last 168 hours. No results for input(s): "AMMONIA" in the last 168 hours. CBC: Recent Labs    07/26/22 1258 08/09/22 1343 09/06/22 1325 09/06/22 1326 09/07/22 1433 10/10/22 1308 11/08/22 1243 11/15/22 1334 12/06/22 1326 12/06/22 1327 12/13/22 1424 01/12/23 1419 01/28/23 1539 03/02/23 0402 03/02/23 1558 03/25/23 0321 03/26/23 0950 03/26/23 1620  03/28/23 0314 03/29/23 1552  HGB 8.0*   < > 10.0*  --  10.5*   < > 9.9*   < > 8.9*  --    < > 8.3*   < > 7.6*   < > 7.9* 7.7* 8.4* 7.8* 7.6*  MCV 90.7   < > 88.7  --  86.7   < > 91.2   < > 95.5  --    < > 93.3   < > 91.5   < > 88.4 89.1 91.3 89.8 90.3  VITAMINB12  --   --   --   --  273  --   --   --   --   --   --   --   --   --   --   --   --   --   --   --   FERRITIN 394*  --   --  401*  --   --  258  --   --  209  --  277  --   --   --   --   --   --   --   --   TIBC 232*  --  258  --   --   --  256  --  269  --   --  263  --  217*  --   --   --   --   --   --   IRON 64  --  69  --   --   --  53  --  46  --   --  32  --  70  --   --   --   --   --   --   RETICCTPCT 2.2  --   --  1.4  --   --  2.1  --   --  2.1  --  2.6  --   --   --   --   --   --   --   --    < > = values in this interval  not displayed.    Cardiac Enzymes: Recent Labs  Lab 03/27/23 0400  CKTOTAL 70   CBG: Recent Labs  Lab 03/23/23 1614 03/29/23 1455 03/29/23 2208 03/30/23 0751  GLUCAP 133* 91 74 95    Iron Studies: No results for input(s): "IRON", "TIBC", "TRANSFERRIN", "FERRITIN" in the last 72 hours. Studies/Results: No results found.  Medications: Infusions:  DAPTOmycin (CUBICIN) 650 mg in sodium chloride 0.9 % IVPB 650 mg (03/29/23 1703)    Scheduled Medications:  acetaminophen  1,000 mg Oral TID   acidophilus  2 capsule Oral TID   apixaban  2.5 mg Oral BID   calcitRIOL  0.5 mcg Oral Daily   calcium carbonate  1 tablet Oral Daily   Chlorhexidine Gluconate Cloth  6 each Topical Q0600   darbepoetin (ARANESP) injection - NON-DIALYSIS  100 mcg Subcutaneous Q Fri-1800   diclofenac  1 patch Transdermal BID   DULoxetine  60 mg Oral Daily   feeding supplement (NEPRO CARB STEADY)  237 mL Oral TID BM   lidocaine  2 patch Transdermal Q24H   loratadine  10 mg Oral Daily   methocarbamol  500 mg Oral TID   midodrine  10 mg Oral Q M,W,F-HD   mometasone-formoterol  2 puff Inhalation BID   multivitamin  1 tablet Oral QHS   oxyCODONE  15 mg Oral Q12H   polyethylene  glycol  17 g Oral BID   pregabalin  75 mg Oral Daily   senna-docusate  1 tablet Oral BID   sodium chloride flush  10-40 mL Intracatheter Q12H   sodium chloride flush  3 mL Intravenous Q12H    have reviewed scheduled and prn medications.  Physical Exam: General: staring off with O2 mask-  some conversation when prompted-  then incoherent mumbling Heart:RRR, s1s2 nl Lungs:clear b/l, no crackle Abdomen:soft, Non-tender, non-distended Extremities:  edema better Dialysis Access: [facility] TDC, L BCF + T/B    [name] [name] 03/30/2023,9:10 AM  LOS: 31 days

## 2023-03-30 NOTE — Progress Notes (Signed)
Pt. returned from IR after having tunneled central venous catheter removed. Dry dressing in place.

## 2023-03-30 NOTE — Procedures (Signed)
Patient was seen on dialysis and the procedure was supervised.  BFR 350  Via TDC BP is  103/45.   Patient appears to be tolerating treatment well  Cecille Aver 03/30/2023

## 2023-03-30 NOTE — Progress Notes (Signed)
Pt. noted to be very confused/lethargic but does respond when stimulated. MD aware. HD states "she will be dialyzed at some point this am." Pt. Noted to have O2 sat of 88% on 3 LPM, however, pt. was mouth breathing. Pt. placed on oxymask at 5 LPM and sat increased to 95%. Will continue to monitor for changes and report changes to MD accordingly.

## 2023-03-30 NOTE — Progress Notes (Signed)
Pt. returned from HD at this time. Pt. continues to be confused and temp. noted to be 102.7 axillary at this time. MDs made aware. Will continue to monitor and report changes to MD accordingly.

## 2023-03-30 NOTE — Procedures (Signed)
PROCEDURE SUMMARY:  Successful removal of tunneled central venous catheter.  Patient tolerated well.  EBL < 5 mL  See full dictation in Imaging for details.  Gwynneth Macleod PA-C 03/30/2023 3:39 PM

## 2023-03-30 NOTE — Progress Notes (Signed)
Pt. noted to be more lucid than this am and is afebrile at this time. Pt.'s husband present at bedside. Call light within reach.

## 2023-03-30 NOTE — Progress Notes (Signed)
Pt transported to HD at this time.

## 2023-03-30 NOTE — Progress Notes (Signed)
Pt returned from CT at this time.  

## 2023-03-30 NOTE — Progress Notes (Signed)
IVT consult placed to d/c pt's CVC after having PIV access.  Primary RN aware that IVT cannot d/c this line. Per procedure notes on 02/02/2023 by Dr. Loreta Ave, line is tunneled and cuffed. Line will have to be removed by IR.   PIV started in patient's R FA.

## 2023-03-30 NOTE — Progress Notes (Signed)
Pt transported to CT at this time.

## 2023-03-30 NOTE — Plan of Care (Signed)
  Problem: Cardiovascular: Goal: Ability to achieve and maintain adequate cardiovascular perfusion will improve Outcome: Progressing Goal: Vascular access site(s) Level 0-1 will be maintained Outcome: Progressing   Problem: Coping: Goal: Ability to adjust to condition or change in health will improve Outcome: Progressing   Problem: Metabolic: Goal: Ability to maintain appropriate glucose levels will improve Outcome: Progressing   Problem: Nutritional: Goal: Maintenance of adequate nutrition will improve Outcome: Progressing Goal: Progress toward achieving an optimal weight will improve Outcome: Progressing   Problem: Skin Integrity: Goal: Risk for impaired skin integrity will decrease Outcome: Progressing   Problem: Tissue Perfusion: Goal: Adequacy of tissue perfusion will improve Outcome: Progressing   Problem: Clinical Measurements: Goal: Ability to maintain clinical measurements within normal limits will improve Outcome: Progressing Goal: Cardiovascular complication will be avoided Outcome: Progressing   Problem: Nutrition: Goal: Adequate nutrition will be maintained Outcome: Progressing   Problem: Coping: Goal: Level of anxiety will decrease Outcome: Progressing   Problem: Elimination: Goal: Will not experience complications related to bowel motility Outcome: Progressing   Problem: Pain Managment: Goal: General experience of comfort will improve Outcome: Progressing   Problem: Safety: Goal: Ability to remain free from injury will improve Outcome: Progressing   Problem: Skin Integrity: Goal: Risk for impaired skin integrity will decrease Outcome: Progressing   Problem: Nutritional: Goal: Ability to make healthy dietary choices will improve Outcome: Progressing   Problem: Clinical Measurements: Goal: Complications related to the disease process, condition or treatment will be avoided or minimized Outcome: Progressing

## 2023-03-31 DIAGNOSIS — M4624 Osteomyelitis of vertebra, thoracic region: Secondary | ICD-10-CM | POA: Diagnosis not present

## 2023-03-31 DIAGNOSIS — R509 Fever, unspecified: Secondary | ICD-10-CM | POA: Diagnosis not present

## 2023-03-31 LAB — RENAL FUNCTION PANEL
Albumin: 2.9 g/dL — ABNORMAL LOW (ref 3.5–5.0)
Anion gap: 16 — ABNORMAL HIGH (ref 5–15)
BUN: 23 mg/dL (ref 8–23)
CO2: 23 mmol/L (ref 22–32)
Calcium: 8.2 mg/dL — ABNORMAL LOW (ref 8.9–10.3)
Chloride: 90 mmol/L — ABNORMAL LOW (ref 98–111)
Creatinine, Ser: 3.63 mg/dL — ABNORMAL HIGH (ref 0.44–1.00)
GFR, Estimated: 13 mL/min — ABNORMAL LOW (ref >60)
Glucose, Bld: 141 mg/dL — ABNORMAL HIGH (ref 70–99)
Phosphorus: 2.3 mg/dL — ABNORMAL LOW (ref 2.5–4.6)
Potassium: 3.5 mmol/L (ref 3.5–5.1)
Sodium: 129 mmol/L — ABNORMAL LOW (ref 135–145)

## 2023-03-31 LAB — GLUCOSE, CAPILLARY: Glucose-Capillary: 126 mg/dL — ABNORMAL HIGH (ref 70–99)

## 2023-03-31 LAB — CULTURE, BLOOD (ROUTINE X 2): Culture: NO GROWTH

## 2023-03-31 MED ORDER — CHLORHEXIDINE GLUCONATE CLOTH 2 % EX PADS
6.0000 | MEDICATED_PAD | Freq: Every day | CUTANEOUS | Status: DC
  Administered 2023-03-31 – 2023-04-05 (×6): 6 via TOPICAL

## 2023-03-31 NOTE — Progress Notes (Signed)
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 TDC placement 03/18/2023, appreciate assistance - CLIP to [facility] MWF 1145 (arrive 1115am 1st day) -  BCF by Dr. Chestine Spore on 5/15 -> positive thrill -s/p HD on 5/27 and 5/29 had intradialytic hypotension required albumin and midodrine.    on 5/31 did not get albumin but also did not remove much - Continue MWF schedule.  I still have some concerns about her suitability for OP HD- her mobility seems not good and now confusion-  will need to see if can sit up in chair for HD will plan for now Monday -   # Anemia: Continue ESA  Required blood transfusion..iron was OK.   Hgb low but stable  # CKD-  Has not needed binder. Continue calcitriol for secondary hyperparathyroidism.  # HTN/volume:   Continue midodrine pre-HD for intradialytic hypotension.  Manage volume with HD. I think now close to EDW although cont hyponatremia would argue against.  UF as able. BP other than in HD is OK -   change midodrine to pre HD only   # Osteomyelitis plan on daptomycin.  Last dose on 6/14.  # Hyponatremia, hypervolemic: Managed with dialysis/   fluid restriction.  # Acute febrile illness: Chest x-ray negative.  On antibiotics for OM.  Blood culture negative so far.  On Friday had fever after HD which argues for HD cath as issue-  but did not happen on Monday or Wed this week.  Central line just removed at  3 PM-  HD line not obviously infected.  My preference would be to watch til Monday.  Will do HD first thing-  then if still having fevers can remove HD line after HD on Monday and possibly wait til Wed or Thurs to replace     # Hypokalemia:  managed with higher potassium bath.  Subjective:   Did HD yest-  removed 600.  Was noted to have temp after she completed HD and also overnight.   ID is concerned about line infection-  blood cultures from 5/29 negative to date. Underwent removal of tunneled  central line yesterday at 3 PM .  Is pretty clear this AM asking what fever is from   Objective Vital signs in last 24 hours: Vitals:   03/31/23 0400 03/31/23 0500 03/31/23 0529 03/31/23 0845  BP: (!) 101/45  (!) 90/55   Pulse: (!) 109  90   Resp: 18  18   Temp: 99.9 F (37.7 C)  99.7 F (37.6 C)   TempSrc: Axillary  Tympanic   SpO2: 99%  95% 95%  Weight:  79.2 kg    Height:       Weight change: -0.8 kg  Intake/Output Summary (Last 24 hours) at 03/31/2023 0913 Last data filed at 03/30/2023 2059 Gross per 24 hour  Intake 240 ml  Output 600 ml  Net -360 ml       Labs: RENAL PANEL Recent Labs  Lab 03/28/23 0314 03/29/23 0337 03/29/23 1552 03/30/23 0305 03/30/23 0834 03/31/23 0056  NA 127* 130* 129* 128*  --  129*  K 3.7 3.4* 3.6 4.0  --  3.5  CL 91* 90* 90* 90*  --  90*  CO2 24 27 26 23   --  23  GLUCOSE 139* 101* 75 90  --  141*  BUN 43* 25* 30* 36*  --  23  CREATININE 4.72* 3.69* 4.36* 5.15*  --  3.63*  CALCIUM 7.8* 8.4* 8.6* 8.3*  --  8.2*  PHOS 4.9* 3.9 4.1 4.5  --  2.3*  ALBUMIN 2.8* 3.4* 3.4* 3.3* 3.2* 2.9*    Liver Function Tests: Recent Labs  Lab 03/30/23 0305 03/30/23 0834 03/31/23 0056  AST  --  24  --   ALT  --  18  --   ALKPHOS  --  121  --   BILITOT  --  2.1*  --   PROT  --  7.2  --   ALBUMIN 3.3* 3.2* 2.9*   No results for input(s): "LIPASE", "AMYLASE" in the last 168 hours. No results for input(s): "AMMONIA" in the last 168 hours. CBC: Recent Labs    07/26/22 1258 08/09/22 1343 09/06/22 1325 09/06/22 1326 09/07/22 1433 10/10/22 1308 11/08/22 1243 11/15/22 1334 12/06/22 1326 12/06/22 1327 12/13/22 1424 01/12/23 1419 01/28/23 1539 03/02/23 0402 03/02/23 1558 03/25/23 0321 03/26/23 0950 03/26/23 1620 03/28/23 0314 03/29/23 1552  HGB 8.0*   < > 10.0*  --  10.5*   < > 9.9*   < > 8.9*  --    < > 8.3*   < > 7.6*   < > 7.9* 7.7* 8.4* 7.8* 7.6*  MCV 90.7   < > 88.7  --  86.7   < > 91.2   < > 95.5  --    < > 93.3   < > 91.5   <  > 88.4 89.1 91.3 89.8 90.3  VITAMINB12  --   --   --   --  273  --   --   --   --   --   --   --   --   --   --   --   --   --   --   --   FERRITIN 394*  --   --  401*  --   --  258  --   --  209  --  277  --   --   --   --   --   --   --   --   TIBC 232*  --  258  --   --   --  256  --  269  --   --  263  --  217*  --   --   --   --   --   --   IRON 64  --  69  --   --   --  53  --  46  --   --  32  --  70  --   --   --   --   --   --   RETICCTPCT 2.2  --   --  1.4  --   --  2.1  --   --  2.1  --  2.6  --   --   --   --   --   --   --   --    < > = values in this interval not displayed.    Cardiac Enzymes: Recent Labs  Lab 03/27/23 0400  CKTOTAL 70   CBG: Recent Labs  Lab 03/29/23 1455 03/29/23 2208 03/30/23 0751  GLUCAP 91 74 95    Iron Studies: No results for input(s): "IRON", "TIBC", "TRANSFERRIN", "FERRITIN" in the last 72 hours. Studies/Results: DG Chest 1 View  Result Date: 03/30/2023 CLINICAL DATA:  Shortness of breath EXAM: CHEST  1 VIEW COMPARISON:  03/27/2023 FINDINGS:  Transverse diameter of heart is increased. Central pulmonary vessels are less prominent. There are residual linear densities in left lower lung field. No new focal infiltrates are seen. Tip of left IJ central venous catheter is seen in superior vena cava. There is interval removal of right IJ central venous catheter. There is minimal blunting of left lateral CP angle. There is no pneumothorax. Old healed fracture is seen in left clavicle. IMPRESSION: There is interval clearing of pulmonary vascular congestion and pulmonary edema. Small linear densities in left lower lung field suggest subsegmental atelectasis. Possible minimal left pleural effusion. Electronically Signed   By: Ernie Avena M.D.   On: 03/30/2023 16:14   IR Removal Tun Cv Cath W/O FL  Result Date: 03/30/2023 INDICATION: Completed IV therapy. Request for removal of tunneled central venous catheter. EXAM: REMOVAL OF TUNNELED CENTRAL VENOUS  CATHETER MEDICATIONS: None COMPLICATIONS: None immediate. PROCEDURE: Informed written consent was obtained from the patient following an explanation of the procedure, risks, benefits and alternatives to treatment. A time out was performed prior to the initiation of the procedure. Aseptic technique was utilized including sterile gloves and hand hygiene. ChloraPrep was used to prep the patient's right neck, chest and existing catheter. Using only gentle traction the catheter was removed intact. Hemostasis was obtained with manual compression. A dressing was placed. The patient tolerated the procedure well without immediate post procedural complication. IMPRESSION: Successful removal of tunneled central venous catheter catheter. Procedure performed by: Corrin Parker, PA-C Electronically Signed   By: Judie Petit.  Shick M.D.   On: 03/30/2023 16:06   CT HEAD WO CONTRAST ( )  Result Date: 03/30/2023 CLINICAL DATA:  Mental status change, unknown cause EXAM: CT HEAD WITHOUT CONTRAST TECHNIQUE: Contiguous axial images were obtained from the base of the skull through the vertex without intravenous contrast. RADIATION DOSE REDUCTION: This exam was performed according to the departmental dose-optimization program which includes automated exposure control, adjustment of the mA and/or kV according to patient size and/or use of iterative reconstruction technique. COMPARISON:  CT head May 15, 24. FINDINGS: Brain: No evidence of acute infarction, hemorrhage, hydrocephalus, extra-axial collection or mass lesion/mass effect. Vascular: No hyperdense vessel identified. Skull: No acute fracture. Sinuses/Orbits: Clear sinuses.  No acute orbital findings. Other: No mastoid effusions. IMPRESSION: No evidence of acute intracranial abnormality. Electronically Signed   By: Feliberto Harts M.D.   On: 03/30/2023 14:45    Medications: Infusions:  DAPTOmycin (CUBICIN) 650 mg in sodium chloride 0.9 % IVPB 650 mg (03/29/23 1703)   [START ON  04/02/2023] DAPTOmycin (CUBICIN) 650 mg in sodium chloride 0.9 % IVPB      Scheduled Medications:  acetaminophen  1,000 mg Oral TID   acidophilus  2 capsule Oral TID   apixaban  2.5 mg Oral BID   calcitRIOL  0.5 mcg Oral Daily   calcium carbonate  1 tablet Oral Daily   Chlorhexidine Gluconate Cloth  6 each Topical Q0600   darbepoetin (ARANESP) injection - NON-DIALYSIS  100 mcg Subcutaneous Q Fri-1800   diclofenac  1 patch Transdermal BID   DULoxetine  60 mg Oral Daily   feeding supplement (NEPRO CARB STEADY)  237 mL Oral TID BM   loratadine  10 mg Oral Daily   methocarbamol  500 mg Oral TID   midodrine  10 mg Oral Q M,W,F-HD   mometasone-formoterol  2 puff Inhalation BID   multivitamin  1 tablet Oral QHS   oxyCODONE  10 mg Oral Q12H   polyethylene glycol  17 g Oral BID  pregabalin  50 mg Oral Daily   senna-docusate  1 tablet Oral BID   sodium chloride flush  10-40 mL Intracatheter Q12H   sodium chloride flush  3 mL Intravenous Q12H    have reviewed scheduled and prn medications.  Physical Exam: General: staring off in bed-  conversation when prompted-   Heart:RRR, s1s2 nl Lungs:clear b/l, no crackle Abdomen:soft, Non-tender, non-distended Extremities:  edema better Dialysis Access: [facility] TDC, non tender  L BCF + T/B    Caryl Asp A Macaulay Reicher 03/31/2023,9:13 AM  LOS: 32 days

## 2023-03-31 NOTE — Progress Notes (Signed)
PROGRESS NOTE   [name] [name]  WUJ:[id number]    DOB: Feb 28, 1956    DOA: 02/01/2023  PCP: Donato Schultz, DO   I have briefly reviewed patients previous medical records in [facility]  Chief Complaint  Patient presents with   Abdominal Pain   Back Pain    Brief Narrative:   67 year old who is moslty wheelchair bound with PMH of CKD stage V, hypertension, cirrhosis of the liver, OSA, morbid obesity, type II DM with neuropathy, hypothyroidism, CAD, PTSD/depression, L1 osteomyelitis status post L1 corpectomy, T10-T11 discitis osteomyelitis status post posterior arthrodesis of T8-T11 and removal of T11 pedicle screws and rod in 12/23, in April 2024, diagnosed with T4-T5 discitis osteomyelitis which was being treated with daptomycin, Neurosurgery did not recommend any further interventions, presented to the hospital on 02/22/2023 for worsening back pain.  This admission diagnosed with recurrent discitis/osteomyelitis, initial acute on chronic kidney disease which has since progressed to new ESRD and on HD.  Due to ongoing intermittent high fevers, ID was reconsulted on 5/29, concern for line infection, CVC removed 5/31, plan to remove HD catheter for a line holiday on 6/3.  Nephrology following for HD needs.  Palliative care consulted for pain management.  5/15, patient had a fall in the OR while being transferred from the OR table.  For details of hospital course, please refer to prior IM provider notes including progress note by Dr. Ramiro Harvest from 03/27/2023.    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  She has had extensive history of discitis/osteomyelitis, surgical intervention, antibiotic treatment etc. prior to this admission.  Please refer to ID follow-up progress note from 03/28/2023 for details. MRI T-spine 4/30 showed persistent T4-T5 discitis/osteomyelitis with mild interval collapse of the T4 vertebral body.  Also had interval increase in size of suspected dorsal epidural fluid collection measuring 7 mm, possibly abscess or phlegmon. Blood cultures x 2 from 5/24 & 5/29: Negative.  Fine-needle aspirate culture from intervertebral disc T4-5 from 5/3: Negative.  MRSA PCR negative. On IV daptomycin since 4/30. Due to recurrent ongoing high fevers, ID was consulted again. Etiology of these fevers unclear but has multiple source possibilities.  Chest x-ray 5/28 unremarkable.  UA not indicative of UTI.  CRP 18.5 (1 on  4/30) and ESR 82 (74 on 4/30).  Surveillance blood cultures from 5/29: NTD.  Concern for line infection.  Right IJ CVC discontinued 5/31.  Concerned that HD line is also infected.  Patient however has not had fevers after each HD cycle.  Overtly the HD line does not look infected.  Per renal/ID,  plan is to monitor through weekend until 6/3 HD and if still having fevers then can remove HD line after HD on Monday for a line holiday.  New ESRD Initially presented with acute kidney injury complicating stage V CKD which has since progressed to ESRD.  Nephrology following. TDC placed 03/28/2023 & AVF 03/30/2023 by vascular surgery. Undergoing HD on MWF schedule.  Has been clipped to outpatient HD. Patient yet to demonstrate that she can set up in chair for dialysis.  Hopefully can try on 6/3.  Acute on chronic pain: Due to recurrent osteomyelitis.  PTA, patient noted to be on Norco, Valium and Lyrica. Palliative care consulted for symptom management. Outpatient follow-up with PCP/referral to pain clinic by PCP.  As per MDs advice, TOC has discussed in detail with patient/spouse that they are to follow-up with their PCP for opioid refills which will be filled only for a few days at time of discharge. Due to mental status changes, suspected due to polypharmacy, minimized some of the meds.  Reduced Robaxin from 750 Mg to 500 Mg 3 times daily, Oxy IR to 5-10 Mg every 8 hours as needed for moderate or severe pain, OxyContin to 10 Mg twice daily, Lyrica to 50 Mg daily and Valium 2.5 Mg every 12 hours as needed.  Discontinued lidocaine patch.  Mental status/alertness better this morning but as per spouse at bedside, patient remains confused which is not her baseline.  Controlled 2 DM with ESRD: A1c 4.6 and 4.7 this admission.  Early on admission had hypoglycemic episodes.  Off all insulins.  Also legally blind in both eyes, left greater than sign right.  CBGs last evening and this morning are okay.  No hypoglycemia.  Diarrhea/constipation: Workup revealed C. difficile antigen positive but toxigenic PCR negative.  Got Questran.  Then had constipation treated with Dulcolax suppository, MiraLAX, sorbitol, soapsuds enemas.  Continue bowel regimen.  Improved.  Last BM 5/29.  Urinary retention: Required  Foley catheter earlier on in hospitalization.  Foley catheter eventually removed on 5/20.  Voiding without difficulty.  Normocytic anemia: Likely multifactorial due to anemia of chronic disease, ESRD.  S/p multiple PRBC transfusions this admission.  Has received IV iron. Continue ESA and monitor CBCs.  Transfuse for hemoglobin 7 g or less.  Periodic monitoring of hemoglobin across dialysis.  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  Stable.  No bleeding reported.  Platelet counts had normalized on 5/29 and 5/30.  Chronic diastolic CHF: Volume management across dialysis.  Lasix discontinued.  Clinically euvolemic, lungs clear to auscultation so etiology of dyspnea not really clear.  Hypertension/hypotension: Continue midodrine pre-HD for intradialytic hypotension.  Depression/anxiety:  Continue Cymbalta/diazepam.  Has been getting infrequent doses of as needed Valium and even missed several days.  Given her recurrent AMS, reduced Valium from 5 mg to 2.5 mg every 12 hours as needed for anxiety.  Chronic lower extremity DVTs: On Eliquis.  Fall in the OR: Sustained during transfer from the OR table to the stretcher.  CT C/A/P with no acute trauma.  CT head negative.  Plain films of left shoulder negative for any fracture.  PT and OT have evaluated.  Hyponatremia, hypervolemic:  Managed  with dialysis.  Fluid restriction.  Stable.   Hypokalemia:   managed with higher potassium bath.  Chest pain/dyspnea Noted 5/24.  Some EKG changes.  Cardiac enzymes flat.  Chest x-ray with no acute infiltrate.  TTE showed normal EF with no wall motion abnormalities and grade 1 diastolic dysfunction.  Chest pain resolved.  Atypical chest pain, suspect musculoskeletal.  Altered mental status/acute toxic metabolic encephalopathy: Per nursing report, has had issues with intermittent confusion which was also verified with patient's spouse.  Since 5/30 afternoon, seems to be more altered.  No  hypoglycemia.  VBG without significant CO2 retention.  CT head without acute findings.  LFTs unremarkable.  Chest x-ray without pneumonia.  Finally suspecting this is multifactorial due to ongoing fevers and polypharmacy.  Pain meds have been down adjusted as discussed above.  Left leg wound appears without acute findings.    Chronic respiratory failure with hypoxia: More dyspneic 5/31 prior to HD.got HD.  Post HD x-ray showed improvement in pulmonary edema.  Already on anticoagulation.  Breathing appears to be at her recent baseline.  Body mass index is 35.27 kg/m./Obesity  Nutritional Status Nutrition Problem: Increased nutrient needs Etiology: acute illness, catabolic illness (acute OM of thoracic spine, new ESRD requiring HD) Signs/Symptoms: estimated needs Interventions: MVI, Nepro shake, Education  Pressure Ulcer: Pressure Injury 02/13/2023 Heel Left (Active)  02/09/2023 1826  Location: Heel  Location Orientation: Left  Staging:   Wound Description (Comments):   Present on Admission: Yes  Dressing Type Foam - Lift dressing to assess site every shift 03/31/23 0845  Continue current wound care as per [facility] RN recommendations.  ACP Documents: None present DVT prophylaxis: apixaban (ELIQUIS) tablet 2.5 mg Start: 03/15/23 1145 Place and maintain sequential compression device Start: 03/01/23 1536     Code Status: Full Code:  Family Communication: Spouse at bedside. Disposition:  Ongoing altered high fevers, altered mental status, SOB.  Remains inpatient appropriate.     Consultants:   ID: Dr. [name] 02/10/2023 Wound care RN IR: Dr.De Melchor Amour 02/28/2023 Nephrology: Dr. Glenna Fellows 03/01/2023 Vascular surgery: Dr. Randie Heinz 03/11/2023 Palliative care: Dr. Linna Darner 03/13/2023  Procedures:   Placement of tunneled left-sided hemodialysis catheter: Per IR: Dr. Fredia Sorrow 03/08/2023 Placement of left jugular nontunneled dialysis catheter using ultrasound and fluoroscopic guidance: Per IR, Dr. Lowella Dandy  03/02/2023 Fluoroscopy guided T5 core biopsy and T4-5 disc aspiration: Per Dr.Katyucia, IR, 03/02/2023 Placement of left jugular type lumen dialysis catheter: Per IR, Dr. Lowella Dandy 03/02/2023 Left brachiocephalic AV fistula placement: Per vascular surgery: Dr. Chestine Spore 03/13/2023 Ultrasound-guided access left internal jugular vein, placement of left internal jugular vein tunneled dialysis catheter: Per Dr. Chestine Spore 03/10/2023  Antimicrobials:   As noted above.   Subjective:  Seen this morning.  Spouse at bedside.  Patient states that she feels okay except back pain.  Appears alert and a little more coherent than yesterday.  Per spouse, still confused and not at her baseline.  Objective:   Vitals:   03/31/23 0529 03/31/23 0845 03/31/23 0924 03/31/23 1300  BP: (!) 90/55  (!) 108/47 (!) 81/40  Pulse: 90  83 80  Resp: 18  15 15   Temp: 99.7 F (37.6 C)  98.5 F (36.9 C) 98.1 F (36.7 C)  TempSrc: Tympanic  Oral Axillary  SpO2: 95% 95% 92% 98%  Weight:      Height:        General exam: Middle-age female, small built and frail, chronically ill looking, lying supine in bed.  Appears comfortable and in no obvious  distress.  Compared to yesterday. Eyes: Legally blind in both eyes.  Bilateral IOLs present. PERTLA Respiratory system: Slightly Minich breath sounds in the bases without wheezing, rhonchi or crackles.  No increased work of breathing. Cardiovascular system: S1 and S2 heard, RRR.  No JVD, murmurs or pedal edema.. Gastrointestinal system: Abdomen is nondistended, soft and nontender. No organomegaly or masses felt. Normal bowel sounds heard. Central nervous system: Alert and oriented x 2.  No focal neurological deficits. Extremities: Symmetric 5 x 5 power. Skin: Left heel ulcer as noted in picture below, clean and without acute findings or drainage.  No wounds on left foot. Psychiatry: Judgement and insight appear normal. Mood & affect appropriate. Lines: As per ID on 5/30, "right IJ CVC in place no  warmth or tenderness. Left [place] HD catheter with no warmth or tenderness. Left AV fistula site looks okay."   Left heel ulcer, picture from 03/30/2023.   Data Reviewed:   I have personally reviewed following labs and imaging studies   CBC: Recent Labs  Lab 03/26/23 0950 03/26/23 1620 03/28/23 0314 03/29/23 1552  WBC 3.7* 4.8 8.6 6.8  NEUTROABS 2.7  --   --   --   HGB 7.7* 8.4* 7.8* 7.6*  HCT 23.7* 26.2* 24.6* 24.3*  MCV 89.1 91.3 89.8 90.3  PLT 102* 104* 161 190    Basic Metabolic Panel: Recent Labs  Lab 03/28/23 0314 03/29/23 0337 03/29/23 1552 03/30/23 0305 03/31/23 0056  NA 127* 130* 129* 128* 129*  K 3.7 3.4* 3.6 4.0 3.5  CL 91* 90* 90* 90* 90*  CO2 24 27 26 23 23   GLUCOSE 139* 101* 75 90 141*  BUN 43* 25* 30* 36* 23  CREATININE 4.72* 3.69* 4.36* 5.15* 3.63*  CALCIUM 7.8* 8.4* 8.6* 8.3* 8.2*  PHOS 4.9* 3.9 4.1 4.5 2.3*    Liver Function Tests: Recent Labs  Lab 03/29/23 0337 03/29/23 1552 03/30/23 0305 03/30/23 0834 03/31/23 0056  AST  --   --   --  24  --   ALT  --   --   --  18  --   ALKPHOS  --   --   --  121  --   BILITOT  --   --   --  2.1*  --   PROT  --   --   --  7.2  --   ALBUMIN 3.4* 3.4* 3.3* 3.2* 2.9*    CBG: Recent Labs  Lab 03/29/23 1455 03/29/23 2208 03/30/23 0751  GLUCAP 91 74 95    Microbiology Studies:   Recent Results (from the past 240 hour(s))  Culture, blood (Routine X 2) w Reflex to ID Panel     Status: None   Collection Time: 03/23/23  4:22 PM   Specimen: BLOOD  Result Value Ref Range Status   Specimen Description BLOOD RIGHT ANTECUBITAL  Final   Special Requests   Final    BOTTLES DRAWN AEROBIC AND ANAEROBIC Blood Culture adequate volume   Culture   Final    NO GROWTH 5 DAYS Performed at [facility], [address] [place], [place] [address]    Report Status 03/28/2023 FINAL  Final  Culture, blood (Routine X 2) w Reflex to ID Panel     Status: None   Collection Time: 03/23/23  4:52 PM   Specimen:  BLOOD RIGHT HAND  Result Value Ref Range Status   Specimen Description BLOOD RIGHT HAND  Final   Special Requests   Final  BOTTLES DRAWN AEROBIC ONLY Blood Culture adequate volume   Culture   Final    NO GROWTH 5 DAYS Performed at [facility], [address], [place], [place] [address]    Report Status 03/28/2023 FINAL  Final  Culture, blood (Routine X 2) w Reflex to ID Panel     Status: None (Preliminary result)   Collection Time: 03/28/23  2:07 PM   Specimen: BLOOD  Result Value Ref Range Status   Specimen Description BLOOD RIGHT ANTECUBITAL  Final   Special Requests   Final    BOTTLES DRAWN AEROBIC AND ANAEROBIC Blood Culture results may not be optimal due to an inadequate volume of blood received in culture bottles   Culture   Final    NO GROWTH 3 DAYS Performed at [facility], [address], [place], [place] [address]    Report Status PENDING  Incomplete  Culture, blood (Routine X 2) w Reflex to ID Panel     Status: None (Preliminary result)   Collection Time: 03/28/23  2:09 PM   Specimen: BLOOD  Result Value Ref Range Status   Specimen Description BLOOD RIGHT ANTECUBITAL  Final   Special Requests   Final    BOTTLES DRAWN AEROBIC AND ANAEROBIC Blood Culture results may not be optimal due to an inadequate volume of blood received in culture bottles   Culture   Final    NO GROWTH 3 DAYS Performed at [facility], [address] [place], [place] [address]    Report Status PENDING  Incomplete    Radiology Studies:  DG Chest 1 View  Result Date: 03/30/2023 CLINICAL DATA:  Shortness of breath EXAM: CHEST  1 VIEW COMPARISON:  03/27/2023 FINDINGS: Transverse diameter of heart is increased. Central pulmonary vessels are less prominent. There are residual linear densities in left lower lung field. No new focal infiltrates are seen. Tip of left IJ central venous catheter is seen in superior vena cava. There is interval removal of right IJ central venous  catheter. There is minimal blunting of left lateral CP angle. There is no pneumothorax. Old healed fracture is seen in left clavicle. IMPRESSION: There is interval clearing of pulmonary vascular congestion and pulmonary edema. Small linear densities in left lower lung field suggest subsegmental atelectasis. Possible minimal left pleural effusion. Electronically Signed   By: Ernie Avena M.D.   On: 03/30/2023 16:14   IR Removal Tun Cv Cath W/O FL  Result Date: 03/30/2023 INDICATION: Completed IV therapy. Request for removal of tunneled central venous catheter. EXAM: REMOVAL OF TUNNELED CENTRAL VENOUS CATHETER MEDICATIONS: None COMPLICATIONS: None immediate. PROCEDURE: Informed written consent was obtained from the patient following an explanation of the procedure, risks, benefits and alternatives to treatment. A time out was performed prior to the initiation of the procedure. Aseptic technique was utilized including sterile gloves and hand hygiene. ChloraPrep was used to prep the patient's right neck, chest and existing catheter. Using only gentle traction the catheter was removed intact. Hemostasis was obtained with manual compression. A dressing was placed. The patient tolerated the procedure well without immediate post procedural complication. IMPRESSION: Successful removal of tunneled central venous catheter catheter. Procedure performed by: Corrin Parker, PA-C Electronically Signed   By: Judie Petit.  Shick M.D.   On: 03/30/2023 16:06   CT HEAD WO CONTRAST ( )  Result Date: 03/30/2023 CLINICAL DATA:  Mental status change, unknown cause EXAM: CT HEAD WITHOUT CONTRAST TECHNIQUE: Contiguous axial images were obtained from the base of the  skull through the vertex without intravenous contrast. RADIATION DOSE REDUCTION: This exam was performed according to the departmental dose-optimization program which includes automated exposure control, adjustment of the mA and/or kV according to patient size and/or use of  iterative reconstruction technique. COMPARISON:  CT head May 15, 24. FINDINGS: Brain: No evidence of acute infarction, hemorrhage, hydrocephalus, extra-axial collection or mass lesion/mass effect. Vascular: No hyperdense vessel identified. Skull: No acute fracture. Sinuses/Orbits: Clear sinuses.  No acute orbital findings. Other: No mastoid effusions. IMPRESSION: No evidence of acute intracranial abnormality. Electronically Signed   By: Feliberto Harts M.D.   On: 03/30/2023 14:45    Scheduled Meds:    acetaminophen  1,000 mg Oral TID   acidophilus  2 capsule Oral TID   apixaban  2.5 mg Oral BID   calcitRIOL  0.5 mcg Oral Daily   calcium carbonate  1 tablet Oral Daily   Chlorhexidine Gluconate Cloth  6 each Topical Q0600   Chlorhexidine Gluconate Cloth  6 each Topical Q0600   darbepoetin (ARANESP) injection - NON-DIALYSIS  100 mcg Subcutaneous Q Fri-1800   diclofenac  1 patch Transdermal BID   DULoxetine  60 mg Oral Daily   feeding supplement (NEPRO CARB STEADY)  237 mL Oral TID BM   loratadine  10 mg Oral Daily   methocarbamol  500 mg Oral TID   midodrine  10 mg Oral Q M,W,F-HD   mometasone-formoterol  2 puff Inhalation BID   multivitamin  1 tablet Oral QHS   oxyCODONE  10 mg Oral Q12H   polyethylene glycol  17 g Oral BID   pregabalin  50 mg Oral Daily   senna-docusate  1 tablet Oral BID   sodium chloride flush  10-40 mL Intracatheter Q12H   sodium chloride flush  3 mL Intravenous Q12H    Continuous Infusions:    DAPTOmycin (CUBICIN) 650 mg in sodium chloride 0.9 % IVPB 650 mg (03/29/23 1703)   [START ON 04/02/2023] DAPTOmycin (CUBICIN) 650 mg in sodium chloride 0.9 % IVPB       LOS: 32 days     Marcellus Scott, MD,  FACP, FHM, SFHM, [facility], [facility]   [facility] [place]     To contact the attending provider between 7A-7P or the covering provider during after hours 7P-7A, please log into the web site [email or web address] and access using  universal [place] password for that web site. If you do not have the password, please call the hospital operator.  03/31/2023, 2:21 PM

## 2023-03-31 NOTE — Progress Notes (Addendum)
[facility]  Date of Admission:  02/12/2023           Reason for visit: Follow up on Fevers, Discitis/OM, Suspected Line Infection  Current antibiotics: Daptomycin   ASSESSMENT:    67 y.o. adult admitted with:  Discitis/osteomyelitis: Patient with extensive history of infection in her back.  History of thoracolumbar discitis/osteomyelitis and instrumentation.  Previously, in March 2021 she was treated for thoracolumbar infection due to Pseudomonas. However, more recent cultures have yielded MRSE with T10-T11 discitis and erosion requiring revision with extension of fusion to T8 in August 2023.  Imaging in August 2023 also noted early discitis/OM of T4-5.  She was supposed to be on doxycycline suppression but did not tolerate this and subsequently stopped antibiotics after IV therapy in October 2023.  She has subsequently had progression of infection at T4-5 noted on MRI in March 2024 with aspiration again yielding MRSE and currently on a prolonged course of daptomycin initially with an anticipated end date of 03/30/23.  However, she has been readmitted with worsening back pain and MRI showing new osteomyelitis at T3 with vertebral collapse of T4 since her previous MRI in March.  She had repeat aspiration 03/02/23 which was negative.  Unclear if this MRI finding on 02/27/23 was new infection vs radiographic lag.  Nonetheless, her daptomycin was extended an additional 2 weeks through 04/13/23 by her primary ID provider, Dr Thedore Mins with plan for oral suppression thereafter. 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 Fevers: No definitive source of fevers, however, suspicion is for a line infection.  She had her right IJ CVC removed just yesterday afternoon.  HD line remains in place, but concerned that this line is compromised as well given  fevers have developed within a short time interval following her HD sessions (but not with every session).  Her multiple blood cultures have been negative in the setting of being on daptomycin for several weeks.  Other infectious workup with UA, chest x-ray, head CT have been unremarkable.  We have not reimaged her back since her chronic pain has been relatively stable and would likely include radiographic changes that would be difficult to discern the significance of in the setting of her known issues outlined in #1.  Additionally, her most recent CT of the abdomen/pelvis did not note any significant new musculoskeletal changes in her lumbar spine.  RECOMMENDATIONS:    Continue daptomycin Appreciate nephrology input. Will watch until Monday and plan HD at that point.  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)   IDA (iron deficiency anemia)   PTSD (post-traumatic stress disorder)   Hypertension  High cholesterol   GERD (gastroesophageal reflux disease)   Fibromyalgia   Depression   Asthma   Anxiety   Anemia   Hypothyroidism   CKD (chronic kidney disease) stage 5, GFR less than 15 ml/min (HCC)   Osteomyelitis (HCC)   Chronic diastolic CHF (congestive heart failure) (HCC)   Thrombocytopenia (HCC)   ESRD on dialysis  (HCC)   Fever   Chronic thoracic back pain    MEDICATIONS:    Scheduled Meds:  acetaminophen  1,000 mg Oral TID   acidophilus  2 capsule Oral TID   apixaban  2.5 mg Oral BID   calcitRIOL  0.5 mcg Oral Daily   calcium carbonate  1 tablet Oral Daily   Chlorhexidine Gluconate Cloth  6 each Topical Q0600   darbepoetin (ARANESP) injection - NON-DIALYSIS  100 mcg Subcutaneous Q Fri-1800   diclofenac  1 patch Transdermal BID   DULoxetine  60 mg Oral Daily   feeding supplement (NEPRO CARB STEADY)  237 mL Oral TID BM   loratadine  10 mg Oral Daily   methocarbamol  500 mg Oral TID   midodrine  10 mg Oral Q M,W,F-HD   mometasone-formoterol  2 puff Inhalation BID   multivitamin  1 tablet Oral QHS   oxyCODONE  10 mg Oral Q12H   polyethylene glycol  17 g Oral BID   pregabalin  50 mg Oral Daily   senna-docusate  1 tablet Oral BID   sodium chloride flush  10-40 mL Intracatheter Q12H   sodium chloride flush  3 mL Intravenous Q12H   Continuous Infusions:  DAPTOmycin (CUBICIN) 650 mg in sodium chloride 0.9 % IVPB 650 mg (03/29/23 1703)   [START ON 04/02/2023] DAPTOmycin (CUBICIN) 650 mg in sodium chloride 0.9 % IVPB     PRN Meds:.albuterol, alteplase, diazepam, heparin, hydrALAZINE, lidocaine (PF), lidocaine-prilocaine, naLOXone (NARCAN)  injection, nitroGLYCERIN, ondansetron (ZOFRAN) IV, mouth rinse, oxyCODONE, pentafluoroprop-tetrafluoroeth  SUBJECTIVE:   24 hour events:  Patient was febrile yesterday upon return from dialysis.  She had her right IJ tunneled CVC removed out of concern for line infection Her left [place] HD line remains in place We attempted to arrange for antibiotic lock therapy with vancomycin, however, IV team was unable to perform this with the dialysis line She had a CT head which was unremarkable, chest x-ray unremarkable Her renal panel and hepatic panel are unrevealing Blood cultures 5/24 finalized no growth, blood cultures 5/29 no growth at 2 days Febrile again  overnight upwards of 103.1 F  No new complaints other than back pain and fevers.  States her back pain is worse today.  Review of Systems  All other systems reviewed and are negative.     OBJECTIVE:   Blood pressure (!) 90/55, pulse 90, temperature 99.7 F (37.6 C), temperature source Tympanic, resp. rate 18, height 4\' 11"  (1.499 m), weight 79.2 kg, SpO2 95 %. Body mass index is 35.27 kg/m.  Physical Exam Constitutional:      Comments: Chronically ill appearing woman. Lying in bed Eyes:     Extraocular Movements: Extraocular movements intact.  Abdominal:     Palpations: Abdomen is soft.     Tenderness: There is no abdominal tenderness. There is no guarding.  Musculoskeletal:     Cervical back: Normal range of motion and neck supple.     Right lower leg: No edema.     Left lower leg: No edema.     Comments: Her right CVC line removed.  PIV in place. STill has  left HD line.   Skin:    General: Skin is warm and dry.    Lab Results: Lab Results  Component Value Date   WBC 6.8 03/29/2023   HGB 7.6 (L) 03/29/2023   HCT 24.3 (L) 03/29/2023   MCV 90.3 03/29/2023   PLT 190 03/29/2023    Lab Results  Component Value Date   NA 129 (L) 03/31/2023   K 3.5 03/31/2023   CO2 23 03/31/2023   GLUCOSE 141 (H) 03/31/2023   BUN 23 03/31/2023   CREATININE 3.63 (H) 03/31/2023   CALCIUM 8.2 (L) 03/31/2023   GFRNONAA 13 (L) 03/31/2023   GFRAA 31 (L) 06/16/2020    Lab Results  Component Value Date   ALT 18 03/30/2023   AST 24 03/30/2023   ALKPHOS 121 03/30/2023   BILITOT 2.1 (H) 03/30/2023       Component Value Date/Time   CRP 18.5 (H) 03/28/2023 1407       Component Value Date/Time   ESRSEDRATE 82 (H) 03/28/2023 1407     I have reviewed the micro and lab results in Epic.  Imaging: DG Chest 1 View  Result Date: 03/30/2023 CLINICAL DATA:  Shortness of breath EXAM: CHEST  1 VIEW COMPARISON:  03/27/2023 FINDINGS: Transverse diameter of heart is increased. Central  pulmonary vessels are less prominent. There are residual linear densities in left lower lung field. No new focal infiltrates are seen. Tip of left IJ central venous catheter is seen in superior vena cava. There is interval removal of right IJ central venous catheter. There is minimal blunting of left lateral CP angle. There is no pneumothorax. Old healed fracture is seen in left clavicle. IMPRESSION: There is interval clearing of pulmonary vascular congestion and pulmonary edema. Small linear densities in left lower lung field suggest subsegmental atelectasis. Possible minimal left pleural effusion. Electronically Signed   By: Ernie Avena M.D.   On: 03/30/2023 16:14   IR Removal Tun Cv Cath W/O FL  Result Date: 03/30/2023 INDICATION: Completed IV therapy. Request for removal of tunneled central venous catheter. EXAM: REMOVAL OF TUNNELED CENTRAL VENOUS CATHETER MEDICATIONS: None COMPLICATIONS: None immediate. PROCEDURE: Informed written consent was obtained from the patient following an explanation of the procedure, risks, benefits and alternatives to treatment. A time out was performed prior to the initiation of the procedure. Aseptic technique was utilized including sterile gloves and hand hygiene. ChloraPrep was used to prep the patient's right neck, chest and existing catheter. Using only gentle traction the catheter was removed intact. Hemostasis was obtained with manual compression. A dressing was placed. The patient tolerated the procedure well without immediate post procedural complication. IMPRESSION: Successful removal of tunneled central venous catheter catheter. Procedure performed by: Corrin Parker, PA-C Electronically Signed   By: Judie Petit.  Shick M.D.   On: 03/30/2023 16:06   CT HEAD WO CONTRAST ( )  Result Date: 03/30/2023 CLINICAL DATA:  Mental status change, unknown cause EXAM: CT HEAD WITHOUT CONTRAST TECHNIQUE: Contiguous axial images were obtained from the base of the skull through the  vertex without intravenous contrast. RADIATION DOSE REDUCTION: This exam was performed according to the departmental dose-optimization program which includes automated exposure control, adjustment of the mA and/or kV according to patient size and/or use of iterative reconstruction technique. COMPARISON:  CT head May 15, 24. FINDINGS: Brain: No evidence of acute infarction, hemorrhage, hydrocephalus, extra-axial collection or mass lesion/mass effect. Vascular: No hyperdense vessel identified. Skull: No acute fracture. Sinuses/Orbits: Clear sinuses.  No acute orbital findings. Other: No mastoid  effusions. IMPRESSION: No evidence of acute intracranial abnormality. Electronically Signed   By: Feliberto Harts M.D.   On: 03/30/2023 14:45     Imaging independently reviewed in Epic.    Vedia Coffer for Infectious Disease [facility] [phone] pager 03/31/2023, 7:58 AM

## 2023-03-31 NOTE — Progress Notes (Signed)
   03/31/23 0159  Assess: MEWS Score  Temp (!) 103.1 F (39.5 C)  BP (!) 140/49  MAP (mmHg) 74  Pulse Rate (!) 110  Resp 19  Level of Consciousness Alert  SpO2 91 %  O2 Device Simple Mask  O2 Flow Rate (L/min) 5 L/min  Assess: MEWS Score  MEWS Temp 2  MEWS Systolic 0  MEWS Pulse 1  MEWS RR 0  MEWS LOC 0  MEWS Score 3  MEWS Score Color Yellow  Assess: if the MEWS score is Yellow or Red  Were vital signs taken at a resting state? Yes  Focused Assessment Change from prior assessment (see assessment flowsheet)  Does the patient meet 2 or more of the SIRS criteria? Yes  Does the patient have a confirmed or suspected source of infection? No  MEWS guidelines implemented  Yes, yellow  Treat  MEWS Interventions Considered administering scheduled or prn medications/treatments as ordered  Take Vital Signs  Increase Vital Sign Frequency  Yellow: Q2hr x1, continue Q4hrs until patient remains green for 12hrs  Escalate  MEWS: Escalate Yellow: Discuss with charge nurse and consider notifying provider and/or RRT  Notify: Charge Nurse/RN  Name of Charge Nurse/RN Associate Professor, RN  Provider Notification  Provider Name/Title Anthoney Harada, NP  Date Provider Notified 03/31/23  Time Provider Notified 0200  Method of Notification Page  Notification Reason Other (Comment) (Yellow MEWS)  Provider response See new orders  Date of Provider Response 03/31/23  Time of Provider Response 0220  Assess: SIRS CRITERIA  SIRS Temperature  1  SIRS Pulse 1  SIRS Respirations  0  SIRS WBC 0  SIRS Score Sum  2

## 2023-03-31 NOTE — Progress Notes (Signed)
Pt is complaining of SOB. See MAR for medication administrate. Pt oxygen is 95% on 3L and temp 98.7 oral. Reported to night RN and Consulting civil engineer.

## 2023-03-31 DEATH — deceased

## 2023-04-01 DIAGNOSIS — R509 Fever, unspecified: Secondary | ICD-10-CM | POA: Diagnosis not present

## 2023-04-01 DIAGNOSIS — M4624 Osteomyelitis of vertebra, thoracic region: Secondary | ICD-10-CM | POA: Diagnosis not present

## 2023-04-01 LAB — RENAL FUNCTION PANEL
Albumin: 2.6 g/dL — ABNORMAL LOW (ref 3.5–5.0)
Anion gap: 15 (ref 5–15)
BUN: 41 mg/dL — ABNORMAL HIGH (ref 8–23)
CO2: 23 mmol/L (ref 22–32)
Calcium: 8.4 mg/dL — ABNORMAL LOW (ref 8.9–10.3)
Chloride: 91 mmol/L — ABNORMAL LOW (ref 98–111)
Creatinine, Ser: 5.13 mg/dL — ABNORMAL HIGH (ref 0.44–1.00)
GFR, Estimated: 9 mL/min — ABNORMAL LOW (ref >60)
Glucose, Bld: 133 mg/dL — ABNORMAL HIGH (ref 70–99)
Phosphorus: 3.6 mg/dL (ref 2.5–4.6)
Potassium: 3.5 mmol/L (ref 3.5–5.1)
Sodium: 129 mmol/L — ABNORMAL LOW (ref 135–145)

## 2023-04-01 LAB — GLUCOSE, CAPILLARY
Glucose-Capillary: 124 mg/dL — ABNORMAL HIGH (ref 70–99)
Glucose-Capillary: 80 mg/dL (ref 70–99)
Glucose-Capillary: 99 mg/dL (ref 70–99)

## 2023-04-01 LAB — BLOOD GAS, ARTERIAL
Acid-base deficit: 1.3 mmol/L (ref 0.0–2.0)
Bicarbonate: 23.7 mmol/L (ref 20.0–28.0)
O2 Saturation: 92.9 %
Patient temperature: 36.7
pCO2 arterial: 39 mmHg (ref 32–48)
pH, Arterial: 7.38 (ref 7.35–7.45)
pO2, Arterial: 59 mmHg — ABNORMAL LOW (ref 83–108)

## 2023-04-01 MED ORDER — SODIUM CHLORIDE 0.9 % IV SOLN: 2.0000 g | Freq: Once | INTRAVENOUS | Status: DC

## 2023-04-01 MED ORDER — METHYLPREDNISOLONE SODIUM SUCC 125 MG IJ SOLR
125.0000 mg | Freq: Once | INTRAMUSCULAR | Status: AC
  Administered 2023-04-01: 125 mg via INTRAVENOUS
  Filled 2023-04-01: qty 2

## 2023-04-01 MED ORDER — SODIUM CHLORIDE 0.9 % IV BOLUS
250.0000 mL | Freq: Once | INTRAVENOUS | Status: AC
  Administered 2023-04-01: 250 mL via INTRAVENOUS

## 2023-04-01 MED ORDER — NOREPINEPHRINE 4 MG/250ML-% IV SOLN
2.0000 ug/min | INTRAVENOUS | Status: DC
  Administered 2023-04-02: 4 ug/min via INTRAVENOUS
  Administered 2023-04-03: 2 ug/min via INTRAVENOUS
  Administered 2023-04-04: 3 ug/min via INTRAVENOUS
  Administered 2023-04-04 – 2023-04-05 (×2): 10 ug/min via INTRAVENOUS
  Filled 2023-04-01 (×5): qty 250

## 2023-04-01 MED ORDER — DIAZEPAM 2 MG PO TABS
1.0000 mg | ORAL_TABLET | Freq: Two times a day (BID) | ORAL | Status: DC | PRN
  Administered 2023-04-01: 1 mg via ORAL
  Filled 2023-04-01: qty 1

## 2023-04-01 MED ORDER — MIDODRINE HCL 5 MG PO TABS
10.0000 mg | ORAL_TABLET | Freq: Three times a day (TID) | ORAL | Status: DC
  Administered 2023-04-01: 10 mg via ORAL
  Filled 2023-04-01 (×3): qty 2

## 2023-04-01 MED ORDER — SODIUM CHLORIDE 0.9 % IV SOLN: 2.0000 g | INTRAVENOUS | Status: DC

## 2023-04-01 MED ORDER — SODIUM CHLORIDE 0.9 % IV SOLN: 250.0000 mL | INTRAVENOUS | Status: DC

## 2023-04-01 MED ORDER — SODIUM CHLORIDE 0.9 % IV SOLN: INTRAVENOUS | Status: DC | PRN

## 2023-04-01 MED ORDER — MIDODRINE HCL 5 MG PO TABS: 10.0000 mg | ORAL_TABLET | Freq: Two times a day (BID) | ORAL | Status: DC

## 2023-04-01 MED ORDER — MIDODRINE HCL 5 MG PO TABS
10.0000 mg | ORAL_TABLET | Freq: Two times a day (BID) | ORAL | Status: DC
  Administered 2023-04-01: 10 mg via ORAL
  Filled 2023-04-01: qty 2

## 2023-04-01 NOTE — Progress Notes (Addendum)
eLink Physician-Brief Progress Note Patient Name: [name] [name] DOB: 1955/12/01 MRN: [id number]   Date of Service  04/01/2023  HPI/Events of Note  67 year old presented with morbid obesity, type 2 diabetes, recurrent discitis and osteomyelitis with new end-stage renal disease complicated by acute encephalopathy.  Blood pressure has been low in the 70s-80s systolic.  Increases with stimulation.  eICU Interventions  Add on norepinephrine peripherally as needed.   Her respiratory status seems tenuous, although latest ABG is fairly unremarkable.   2351 -latest neuroassessment shows that she is more drowsy and slower to wake up.  Still moving all extremities spontaneously and speaking intermittently.  End-tidal appears in within normal limits.  Blood glucose within normal limits.  Last dose of oxycodone seems to be 24 hours ago.  Received diazepam at the beginning of shift.  Patient is also scheduled to receive gabapentin and methocarbamol.  Acute encephalopathy likely secondary to meds.  Maintaining airway for now.  Will discontinue diazepam, holding oral meds at this point anyway.  Intervention Category Intermediate Interventions: Hypotension - evaluation and management  Lonnel Gjerde 04/01/2023, 8:13 PM

## 2023-04-01 NOTE — Significant Event (Signed)
Rapid Response Event Note   Reason for Call :  Decreased LOC, hypotensive  Initial Focused Assessment:  She is lying in bed she will say yes and states that she is in [place] but can't communicate any other details.  She is somnolent.  She is pale and cool to the touch.  Lung sounds clear, heart tones regular BP 79/60  HR 84  RR 10  O2 sat 91% on 4l [place]  Given Narcan x 2, has not had narcotics since yesterday.  She is now restless and agitated but her mentation has not improved.    BP 146/85  HR 115-120  RR 30s  O2 sat 89-91% on 6L [place]  Increased O2 to NRB  O2 sats 100%  Transferred to 3M12, staff at bedside to receive patient.  Husband at bedside   Interventions:  Narcan x 2 ABG  Increased to NRB  Plan of Care:     Event Summary:   MD Notified: [place] Call Time: 1500 Arrival Time: 1515 End Time: 1700  Marcellina Millin, RN

## 2023-04-01 NOTE — Progress Notes (Signed)
PROGRESS NOTE   [name] [name]  WGN:[id number]    DOB: 1956/03/15    DOA: 01/31/2023  PCP: Donato Schultz, DO   I have briefly reviewed patients previous medical records in [facility].  Chief Complaint  Patient presents with   Abdominal Pain   Back Pain    Brief Narrative:   67 year old who is moslty wheelchair bound with PMH of CKD stage V, hypertension, cirrhosis of the liver, OSA, morbid obesity, type II DM with neuropathy, hypothyroidism, CAD, PTSD/depression, L1 osteomyelitis status post L1 corpectomy, T10-T11 discitis osteomyelitis status post posterior arthrodesis of T8-T11 and removal of T11 pedicle screws and rod in 12/23, in April 2024, diagnosed with T4-T5 discitis osteomyelitis which was being treated with daptomycin, Neurosurgery did not recommend any further interventions, presented to the hospital on 02/23/2023 for worsening back pain.  This admission diagnosed with recurrent discitis/osteomyelitis, initial acute on chronic kidney disease which has since progressed to new ESRD and on HD.  Due to ongoing intermittent high fevers, ID was reconsulted on 5/29, concern for line infection, CVC removed 5/31, plan to remove HD catheter for a line holiday on 6/3.  Nephrology following for HD needs.  Palliative care consulted for pain management.  5/15, patient had a fall in the OR while being transferred from the OR table.  For details of hospital course, please refer to prior IM provider notes including progress note by Dr. Ramiro Harvest from 03/27/2023.    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  She has had extensive history of discitis/osteomyelitis, surgical intervention, antibiotic treatment etc. prior to this admission.  Please refer to ID follow-up progress note from 03/28/2023 for details. MRI T-spine 4/30 showed persistent T4-T5 discitis/osteomyelitis with mild interval collapse of the T4 vertebral body.  Also had interval increase in size of suspected dorsal epidural fluid collection measuring 7 mm, possibly abscess or phlegmon. Blood cultures x 2 from 5/24 & 5/29: Negative.  Fine-needle aspirate culture from intervertebral disc T4-5 from 5/3: Negative.  MRSA PCR negative. On IV daptomycin since 4/30. Due to recurrent ongoing high fevers, ID was consulted again. Etiology of these fevers unclear but has multiple source possibilities.  Chest x-ray 5/28 unremarkable.  UA not indicative of UTI.  CRP 18.5 (1 on  4/30) and ESR 82 (74 on 4/30).  Surveillance blood cultures from 5/29: NTD.  Right IJ CVC discontinued 5/31.  Concern that HD line is infected.  Patient however has not had fevers after each HD cycle.  Overtly the HD line does not look infected.  Per renal/ID, plan is to monitor through weekend  and if still having fevers after 6/3 HD, then can remove HD line after HD on Monday for a line holiday.  No high fever since early a.m. of 6/1.  New ESRD Initially presented with acute kidney injury complicating stage V CKD which has since progressed to ESRD.  Nephrology following. TDC placed 02/28/2023 & AVF 03/30/2023 by vascular surgery. Undergoing HD on MWF schedule.  Has been clipped to outpatient HD. Patient yet to demonstrate that she can set up in chair for dialysis.  Hopefully can try on 6/3.  Acute on chronic pain: Due to recurrent osteomyelitis.  PTA, patient noted to be on Norco, Valium and Lyrica. Palliative care consulted for symptom management. Outpatient follow-up with PCP/referral to pain clinic by PCP.  As per MDs advice, TOC has discussed in detail with patient/spouse that they are to follow-up with their PCP for opioid refills which will be filled only for a few days at time of discharge. Due to mental status changes, suspected due to polypharmacy, minimized some of the meds.  Reduced Robaxin from 750 Mg to 500 Mg 3 times daily, Oxy IR to 5-10 Mg every 8 hours as needed for moderate or severe pain, OxyContin to 10 Mg twice daily, Lyrica to 50 Mg daily and Valium 2.5 Mg every 12 hours as needed.  Discontinued lidocaine patch.  Pain mostly controlled.  Controlled 2 DM with ESRD: A1c 4.6 and 4.7 this admission.  Early on admission had hypoglycemic episodes.  Off all insulins.  Also legally blind in both eyes, left greater than sign right.  CBGs last evening and this morning are okay.  No hypoglycemia.  Diarrhea/constipation: Workup revealed C. difficile antigen positive but toxigenic PCR negative.  Got Questran.  Then had constipation treated with Dulcolax suppository, MiraLAX, sorbitol, soapsuds enemas.  Continue bowel regimen.  Improved.  Last BM 5/29.  Urinary retention: Required Foley catheter earlier on in hospitalization.  Foley catheter eventually removed on 5/20.  Voiding  without difficulty.  Normocytic anemia: Likely multifactorial due to anemia of chronic disease, ESRD.  S/p multiple PRBC transfusions this admission.  Has received IV iron. Continue ESA and monitor CBCs.  Transfuse for hemoglobin 7 g or less.  Periodic monitoring of hemoglobin across dialysis.  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  Stable.  No bleeding reported.  Platelet counts had normalized on 5/29 and 5/30.  Chronic diastolic CHF: Volume management across dialysis.  Lasix discontinued.  Clinically euvolemic, lungs clear to auscultation so etiology of dyspnea not really clear.  Hypertension/hypotension: This morning having hypotension with SBP's in the 80s, midodrine changed to scheduled twice daily.  Depression/anxiety:  Continue Cymbalta/diazepam.  Has been getting infrequent doses of as needed Valium and even missed several days.  Given her recurrent AMS, reduced Valium from 5 mg to 2.5 mg every 12 hours as needed for anxiety.  Received a dose of Valium 2.5 mg last night and quite sleepy this morning again, as per discussion extensively with spouse at bedside, patient was taking this infrequently even at home, will cut back dose to 2 mg every 12 hours as needed and hold for sedation.  Chronic lower extremity DVTs: On Eliquis.  Fall in  the OR: Sustained during transfer from the OR table to the stretcher.  CT C/A/P with no acute trauma.  CT head negative.  Plain films of left shoulder negative for any fracture.  PT and OT have evaluated.  Hyponatremia, hypervolemic:  Managed with dialysis.  Fluid restriction.  Stable.   Hypokalemia:   managed with higher potassium bath.  Chest pain/dyspnea Noted 5/24.  Some EKG changes.  Cardiac enzymes flat.  Chest x-ray with no acute infiltrate.  TTE showed normal EF with no wall motion abnormalities and grade 1 diastolic dysfunction.  Chest pain resolved.  Atypical chest pain, suspect musculoskeletal.  Altered mental status/acute  toxic metabolic encephalopathy: Per nursing report, has had issues with intermittent confusion which was also verified with patient's spouse.  Since 5/30 afternoon, seems to be more altered.  No hypoglycemia.  VBG without significant CO2 retention.  CT head without acute findings.  LFTs unremarkable.  Chest x-ray without pneumonia.  Finally suspecting this is multifactorial due to ongoing fevers and polypharmacy.  Pain meds have been down adjusted as discussed above.  Left leg wound appears without acute findings.  Per spouse, mental status was much clearer on 6/1.  Chronic respiratory failure with hypoxia: More dyspneic 5/31 prior to HD.got HD.  Post HD x-ray showed improvement in pulmonary edema.  Already on anticoagulation.  Breathing appears to be at her recent baseline.  Body mass index is 35.09 kg/m./Obesity  Nutritional Status Nutrition Problem: Increased nutrient needs Etiology: acute illness, catabolic illness (acute OM of thoracic spine, new ESRD requiring HD) Signs/Symptoms: estimated needs Interventions: MVI, Nepro shake, Education  Pressure Ulcer: Pressure Injury 02/06/2023 Heel Left (Active)  02/17/2023 1826  Location: Heel  Location Orientation: Left  Staging:   Wound Description (Comments):   Present on Admission: Yes  Dressing Type Foam - Lift dressing to assess site every shift 04/01/23 0749  Continue current wound care as per [facility] RN recommendations.  ACP Documents: None present DVT prophylaxis: apixaban (ELIQUIS) tablet 2.5 mg Start: 03/15/23 1145 Place and maintain sequential compression device Start: 03/01/23 1536     Code Status: Full Code:  Family Communication: Spouse at bedside this morning. Disposition:  Ongoing altered high fevers, altered mental status, SOB.  Remains inpatient appropriate.     Consultants:   ID: Dr. [name] 02/10/2023 Wound care RN IR: Dr.De Melchor Amour 02/28/2023 Nephrology: Dr. Glenna Fellows 03/01/2023 Vascular surgery: Dr. Randie Heinz  03/11/2023 Palliative care: Dr. Linna Darner 03/13/2023  Procedures:   Placement of tunneled left-sided hemodialysis catheter: Per IR: Dr. Fredia Sorrow 03/11/2023 Placement of left jugular nontunneled dialysis catheter using ultrasound and fluoroscopic guidance: Per IR, Dr. Lowella Dandy 03/02/2023 Fluoroscopy guided T5 core biopsy and T4-5 disc aspiration: Per Dr.Katyucia, IR, 03/02/2023 Placement of left jugular type lumen dialysis catheter: Per IR, Dr. Lowella Dandy 03/02/2023 Left brachiocephalic AV fistula placement: Per vascular surgery: Dr. Chestine Spore 03/25/2023 Ultrasound-guided access left internal jugular vein, placement of left internal jugular vein tunneled dialysis catheter: Per Dr. Chestine Spore 03/27/2023  Antimicrobials:   As noted above.   Subjective:  Patient sleeping but easily arousable.  Not saying much and drifts back to sleep.  Per nursing, received Valium 2.5 mg last night, probably contributing to her sleepiness.  Per spouse, stayed with her over the course of the night, patient slept through the night.  He reports that mental status yesterday was much better and lucid.  Objective:   Vitals:   04/01/23 0749 04/01/23 0800 04/01/23 0932 04/01/23 1000  BP: (!) 77/45 (!) 86/59 (!) 97/39 92/67  Pulse:  80 82 80 85  Resp: 16 16 14 14   Temp: 97.9 F (36.6 C) 98 F (36.7 C) 98 F (36.7 C) 98.2 F (36.8 C)  TempSrc: Axillary Axillary Axillary Axillary  SpO2: 96% 93% 95%   Weight:      Height:        General exam: Middle-age female, small built and frail, chronically ill looking, lying supine in bed.  Does not appear in any distress.  Comfortable. Eyes: Legally blind in both eyes.  Bilateral IOLs present. PERTLA Respiratory system: Clear to auscultation.  No increased work of breathing. Cardiovascular system: S1 and S2 heard, RRR.  No JVD, murmurs or pedal edema.. Gastrointestinal system: Abdomen is nondistended, soft and nontender. No organomegaly or masses felt. Normal bowel sounds heard. Central nervous system:  Mental status as noted above.  No focal neurological deficits. Extremities: Symmetric 5 x 5 power. Skin: Left heel ulcer as noted in picture below, clean and without acute findings or drainage.  No wounds on left foot. Psychiatry: Judgement and insight appear normal. Mood & affect appropriate. Lines: Left [place] HD catheter with no warmth or tenderness. Left AV fistula site without acute findings either   Left heel ulcer, picture from 03/30/2023.   Data Reviewed:   I have personally reviewed following labs and imaging studies   CBC: Recent Labs  Lab 03/26/23 0950 03/26/23 1620 03/28/23 0314 03/29/23 1552  WBC 3.7* 4.8 8.6 6.8  NEUTROABS 2.7  --   --   --   HGB 7.7* 8.4* 7.8* 7.6*  HCT 23.7* 26.2* 24.6* 24.3*  MCV 89.1 91.3 89.8 90.3  PLT 102* 104* 161 190    Basic Metabolic Panel: Recent Labs  Lab 03/29/23 0337 03/29/23 1552 03/30/23 0305 03/31/23 0056 04/01/23 0109  NA 130* 129* 128* 129* 129*  K 3.4* 3.6 4.0 3.5 3.5  CL 90* 90* 90* 90* 91*  CO2 27 26 23 23 23   GLUCOSE 101* 75 90 141* 133*  BUN 25* 30* 36* 23 41*  CREATININE 3.69* 4.36* 5.15* 3.63* 5.13*  CALCIUM 8.4* 8.6* 8.3* 8.2* 8.4*  PHOS 3.9 4.1 4.5 2.3* 3.6    Liver Function Tests: Recent Labs  Lab 03/29/23 1552 03/30/23 0305 03/30/23 0834 03/31/23 0056 04/01/23 0109  AST  --   --  24  --   --   ALT  --   --  18  --   --   ALKPHOS  --   --  121  --   --   BILITOT  --   --  2.1*  --   --   PROT  --   --  7.2  --   --   ALBUMIN 3.4* 3.3* 3.2* 2.9* 2.6*    CBG: Recent Labs  Lab 03/29/23 2208 03/30/23 0751 03/31/23 2138  GLUCAP 74 95 126*    Microbiology Studies:   Recent Results (from the past 240 hour(s))  Culture, blood (Routine X 2) w Reflex to ID Panel     Status: None   Collection Time: 03/23/23  4:22 PM   Specimen: BLOOD  Result Value Ref Range Status   Specimen Description BLOOD RIGHT ANTECUBITAL  Final   Special Requests   Final    BOTTLES DRAWN AEROBIC AND ANAEROBIC Blood  Culture adequate volume   Culture   Final    NO GROWTH 5 DAYS Performed at [facility], [address], [place], [place] [address]    Report Status 03/28/2023 FINAL  Final  Culture, blood (Routine X 2) w Reflex to ID Panel     Status: None   Collection Time: 03/23/23  4:52 PM   Specimen: BLOOD RIGHT HAND  Result Value Ref Range Status   Specimen Description BLOOD RIGHT HAND  Final   Special Requests   Final    BOTTLES DRAWN AEROBIC ONLY Blood Culture adequate volume   Culture   Final    NO GROWTH 5 DAYS Performed at [facility], [address] [place], [place] [address]    Report Status 03/28/2023 FINAL  Final  Culture, blood (Routine X 2) w Reflex to ID Panel     Status: None (Preliminary result)   Collection Time: 03/28/23  2:07 PM   Specimen: BLOOD  Result Value Ref Range Status   Specimen Description BLOOD RIGHT ANTECUBITAL  Final   Special Requests   Final    BOTTLES DRAWN AEROBIC AND ANAEROBIC Blood Culture results may not be optimal due to an inadequate volume of blood received in culture bottles   Culture   Final    NO GROWTH 4 DAYS Performed at [facility], [address], [place], [place] [address]    Report Status PENDING  Incomplete  Culture, blood (Routine X 2) w Reflex to ID Panel     Status: None (Preliminary result)   Collection Time: 03/28/23  2:09 PM   Specimen: BLOOD  Result Value Ref Range Status   Specimen Description BLOOD RIGHT ANTECUBITAL  Final   Special Requests   Final    BOTTLES DRAWN AEROBIC AND ANAEROBIC Blood Culture results may not be optimal due to an inadequate volume of blood received in culture bottles   Culture   Final    NO GROWTH 4 DAYS Performed at [facility], [address] [place], [place] [address]    Report Status PENDING  Incomplete    Radiology Studies:  DG Chest 1 View  Result Date: 03/30/2023 CLINICAL DATA:  Shortness of breath EXAM: CHEST  1 VIEW COMPARISON:  03/27/2023 FINDINGS: Transverse  diameter of heart is increased. Central pulmonary vessels are less prominent. There are residual linear densities in left lower lung field. No new focal infiltrates are seen. Tip of left IJ central venous catheter is seen in superior vena cava. There is interval removal of right IJ central venous catheter. There is minimal blunting of left lateral CP angle. There is no pneumothorax. Old healed fracture is seen in left clavicle. IMPRESSION: There is interval clearing of pulmonary vascular congestion and pulmonary edema. Small linear densities in left lower lung field suggest subsegmental atelectasis. Possible minimal left pleural effusion. Electronically Signed   By: Ernie Avena M.D.   On: 03/30/2023 16:14   IR Removal Tun Cv Cath W/O FL  Result Date: 03/30/2023 INDICATION: Completed IV therapy. Request for removal of tunneled central venous catheter. EXAM: REMOVAL OF TUNNELED CENTRAL VENOUS CATHETER MEDICATIONS: None COMPLICATIONS: None immediate. PROCEDURE: Informed written consent was obtained from the patient following an explanation of the procedure, risks, benefits and alternatives to treatment. A time out was performed prior to the initiation of the procedure. Aseptic technique was utilized including sterile gloves and hand hygiene. ChloraPrep was used to prep the patient's right neck, chest and existing catheter. Using only gentle traction the catheter was removed intact. Hemostasis was obtained with manual compression. A dressing was placed. The patient tolerated the procedure well without immediate post procedural complication. IMPRESSION: Successful removal of tunneled central venous catheter catheter.  Procedure performed by: Corrin Parker, PA-C Electronically Signed   By: Judie Petit.  Shick M.D.   On: 03/30/2023 16:06   CT HEAD WO CONTRAST ( )  Result Date: 03/30/2023 CLINICAL DATA:  Mental status change, unknown cause EXAM: CT HEAD WITHOUT CONTRAST TECHNIQUE: Contiguous axial images were obtained  from the base of the skull through the vertex without intravenous contrast. RADIATION DOSE REDUCTION: This exam was performed according to the departmental dose-optimization program which includes automated exposure control, adjustment of the mA and/or kV according to patient size and/or use of iterative reconstruction technique. COMPARISON:  CT head May 15, 24. FINDINGS: Brain: No evidence of acute infarction, hemorrhage, hydrocephalus, extra-axial collection or mass lesion/mass effect. Vascular: No hyperdense vessel identified. Skull: No acute fracture. Sinuses/Orbits: Clear sinuses.  No acute orbital findings. Other: No mastoid effusions. IMPRESSION: No evidence of acute intracranial abnormality. Electronically Signed   By: Feliberto Harts M.D.   On: 03/30/2023 14:45    Scheduled Meds:    acetaminophen  1,000 mg Oral TID   acidophilus  2 capsule Oral TID   apixaban  2.5 mg Oral BID   calcitRIOL  0.5 mcg Oral Daily   calcium carbonate  1 tablet Oral Daily   Chlorhexidine Gluconate Cloth  6 each Topical Q0600   Chlorhexidine Gluconate Cloth  6 each Topical Q0600   darbepoetin (ARANESP) injection - NON-DIALYSIS  100 mcg Subcutaneous Q Fri-1800   diclofenac  1 patch Transdermal BID   DULoxetine  60 mg Oral Daily   feeding supplement (NEPRO CARB STEADY)  237 mL Oral TID BM   loratadine  10 mg Oral Daily   methocarbamol  500 mg Oral TID   midodrine  10 mg Oral BID WC   mometasone-formoterol  2 puff Inhalation BID   multivitamin  1 tablet Oral QHS   oxyCODONE  10 mg Oral Q12H   polyethylene glycol  17 g Oral BID   pregabalin  50 mg Oral Daily   senna-docusate  1 tablet Oral BID   sodium chloride flush  10-40 mL Intracatheter Q12H   sodium chloride flush  3 mL Intravenous Q12H    Continuous Infusions:    [START ON 04/02/2023] DAPTOmycin (CUBICIN) 650 mg in sodium chloride 0.9 % IVPB       LOS: 33 days     Marcellus Scott, MD,  FACP, FHM, SFHM, [facility], [facility]   [facility]  & Physician Advisor [place]     To contact the attending provider between 7A-7P or the covering provider during after hours 7P-7A, please log into the web site [email or web address] and access using universal [place] password for that web site. If you do not have the password, please call the hospital operator.  04/01/2023, 11:41 AM

## 2023-04-01 NOTE — Progress Notes (Addendum)
Pharmacy Antibiotic Note  [name] [name] is a 67 y.o. adult admitted on 02/07/2023 with discitis/osteomyelitis. Pharmacy has been consulted for cefepime dosing with ongoing hypotension and lethargy. Pt has been on daptomycin. Pt is newly ESRD on HD MWF, next planned for tomorrow. PCN allergy noted but pt has tolerated cefepime in the past.  Plan: Cefepime 2g IV x1 now then 2g IV MWF after HD  Height: 4\' 11"  (149.9 cm) Weight: 78.8 kg (173 lb 11.6 oz) IBW/kg (Calculated) : 43.2  Temp (24hrs), Avg:98.1 F (36.7 C), Min:97.7 F (36.5 C), Max:99.5 F (37.5 C)  Recent Labs  Lab 03/26/23 0950 03/26/23 1620 03/27/23 0400 03/28/23 0314 03/29/23 0337 03/29/23 1552 03/30/23 0305 03/31/23 0056 04/01/23 0109  WBC 3.7* 4.8  --  8.6  --  6.8  --   --   --   CREATININE  --   --    < > 4.72* 3.69* 4.36* 5.15* 3.63* 5.13*   < > = values in this interval not displayed.    Estimated Creatinine Clearance (by C-G formula based on SCr of 5.13 mg/dL (H)) Female: 9.6 mL/min (A) Female: 11.9 mL/min (A)    Allergies  Allergen Reactions   Bee Pollen Anaphylaxis   Broccoli [Brassica Oleracea] Anaphylaxis and Hives   Fish-Derived Products Anaphylaxis and Hives   Mushroom Extract Complex Anaphylaxis   Penicillins Anaphylaxis    **Tolerated cefepime March 2021 Did it involve swelling of the face/tongue/throat, SOB, or low BP? Yes Did it involve sudden or severe rash/hives, skin peeling, or any reaction on the inside of your mouth or nose? No Did you need to seek medical attention at a hospital or doctor's office? Yes When did it last happen?  A few months ago If all above answers are "NO", may proceed with cephalosporin use.   Rosemary Oil Anaphylaxis   Shellfish Allergy Anaphylaxis and Hives   Tomato Anaphylaxis and Hives    Raw tomatoes only, can tolerate if cooked   Acetaminophen Other (See Comments)    GI upset   Aloe Vera Hives   Doxycycline Diarrhea   Acyclovir And Related Other (See  Comments)    Unknown reaction   Naproxen Hives    Fredonia Highland, PharmD, BCPS, [facility] Clinical Pharmacist [phone] Please check AMION for all [facility] Pharmacy numbers 04/01/2023
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
## 2023-04-01 NOTE — Progress Notes (Signed)
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 TDC placement 03/26/2023, appreciate assistance - CLIP to AF MWF 1145 (arrive 1115am 1st day) -  BCF by Dr. Chestine Spore on 5/15 -> positive thrill -s/p HD on 5/27 and 5/29 had intradialytic hypotension required albumin and midodrine.    on 5/31 did not get albumin but also did not remove much - Continue MWF schedule.  I still have some concerns about her suitability for OP HD- her mobility seems not good and now confusion-  will need to see if can sit up in chair for HD will plan for now Monday -   # Anemia: Continue ESA  Required blood transfusion..iron was OK.   Hgb low but stable  # Bones-  Has not needed binder. Continue calcitriol for secondary hyperparathyroidism.  # HTN/volume:   Continue midodrine pre-HD for intradialytic hypotension.  Manage volume with HD. I think now close to EDW although cont hyponatremia would argue against.  UF as able. BP other than in HD is OK -   change midodrine to pre HD only -  now overall BP low-  will change back to bid every day dosing  # Osteomyelitis plan on daptomycin.  Last dose on 6/14.  # Hyponatremia, hypervolemic: Managed with dialysis/   fluid restriction.  # Acute febrile illness: Chest x-ray negative.  On antibiotics for OM.  Blood culture negative so far.  On Friday had fever after HD which argues for HD cath as issue-  but did not happen on Monday or Wed this week.  Central line just removed at  3 PM on 5/31-  HD line not obviously infected.  My preference would be to watch til Monday.  Will do HD first thing-  then if still having fevers can remove HD line after HD on Monday and possibly wait til Wed or Thurs to replace     # Hypokalemia:  managed with higher potassium bath.  Subjective:   tmax 99.5-  some soft BPs husband at bedside today -  he is concerned when she can't get her BP meds due to low BPS-  somnolent this AM but  agreeable   Objective Vital signs in last 24 hours: Vitals:   04/01/23 0500 04/01/23 0525 04/01/23 0749 04/01/23 0800  BP:  (!) 100/55 (!) 77/45 (!) 86/59  Pulse:  79 80 82  Resp:  18 16 16   Temp:  97.8 F (36.6 C) 97.9 F (36.6 C)   TempSrc:  Oral Axillary   SpO2:  97% 96% 93%  Weight: 78.8 kg     Height:       Weight change: 0.5 kg  Intake/Output Summary (Last 24 hours) at 04/01/2023 0925 Last data filed at 03/31/2023 2148 Gross per 24 hour  Intake 340 ml  Output 0 ml  Net 340 ml       Labs: RENAL PANEL Recent Labs  Lab 03/29/23 0337 03/29/23 1552 03/30/23 0305 03/30/23 0834 03/31/23 0056 04/01/23 0109  NA 130* 129* 128*  --  129* 129*  K 3.4* 3.6 4.0  --  3.5 3.5  CL 90* 90* 90*  --  90* 91*  CO2 27 26 23   --  23 23  GLUCOSE 101* 75 90  --  141* 133*  BUN 25* 30* 36*  --  23 41*  CREATININE 3.69* 4.36* 5.15*  --  3.63* 5.13*  CALCIUM 8.4* 8.6* 8.3*  --  8.2* 8.4*  PHOS 3.9 4.1 4.5  --  2.3* 3.6  ALBUMIN 3.4* 3.4* 3.3* 3.2* 2.9* 2.6*    Liver Function Tests: Recent Labs  Lab 03/30/23 0834 03/31/23 0056 04/01/23 0109  AST 24  --   --   ALT 18  --   --   ALKPHOS 121  --   --   BILITOT 2.1*  --   --   PROT 7.2  --   --   ALBUMIN 3.2* 2.9* 2.6*   No results for input(s): "LIPASE", "AMYLASE" in the last 168 hours. No results for input(s): "AMMONIA" in the last 168 hours. CBC: Recent Labs    07/26/22 1258 08/09/22 1343 09/06/22 1325 09/06/22 1326 09/07/22 1433 10/10/22 1308 11/08/22 1243 11/15/22 1334 12/06/22 1326 12/06/22 1327 12/13/22 1424 01/12/23 1419 01/28/23 1539 03/02/23 0402 03/02/23 1558 03/25/23 0321 03/26/23 0950 03/26/23 1620 03/28/23 0314 03/29/23 1552  HGB 8.0*   < > 10.0*  --  10.5*   < > 9.9*   < > 8.9*  --    < > 8.3*   < > 7.6*   < > 7.9* 7.7* 8.4* 7.8* 7.6*  MCV 90.7   < > 88.7  --  86.7   < > 91.2   < > 95.5  --    < > 93.3   < > 91.5   < > 88.4 89.1 91.3 89.8 90.3  VITAMINB12  --   --   --   --  273  --   --    --   --   --   --   --   --   --   --   --   --   --   --   --   FERRITIN 394*  --   --  401*  --   --  258  --   --  209  --  277  --   --   --   --   --   --   --   --   TIBC 232*  --  258  --   --   --  256  --  269  --   --  263  --  217*  --   --   --   --   --   --   IRON 64  --  69  --   --   --  53  --  46  --   --  32  --  70  --   --   --   --   --   --   RETICCTPCT 2.2  --   --  1.4  --   --  2.1  --   --  2.1  --  2.6  --   --   --   --   --   --   --   --    < > = values in this interval not displayed.    Cardiac Enzymes: Recent Labs  Lab 03/27/23 0400  CKTOTAL 70   CBG: Recent Labs  Lab 03/29/23 1455 03/29/23 2208 03/30/23 0751 03/31/23 2138  GLUCAP 91 74 95 126*    Iron Studies: No results for input(s): "IRON", "TIBC", "TRANSFERRIN", "FERRITIN" in the last 72 hours. Studies/Results: DG Chest 1 View  Result Date: 03/30/2023 CLINICAL DATA:  Shortness of breath EXAM: CHEST  1 VIEW COMPARISON:  03/27/2023 FINDINGS: Transverse diameter of heart is increased. Central  pulmonary vessels are less prominent. There are residual linear densities in left lower lung field. No new focal infiltrates are seen. Tip of left IJ central venous catheter is seen in superior vena cava. There is interval removal of right IJ central venous catheter. There is minimal blunting of left lateral CP angle. There is no pneumothorax. Old healed fracture is seen in left clavicle. IMPRESSION: There is interval clearing of pulmonary vascular congestion and pulmonary edema. Small linear densities in left lower lung field suggest subsegmental atelectasis. Possible minimal left pleural effusion. Electronically Signed   By: Ernie Avena M.D.   On: 03/30/2023 16:14   IR Removal Tun Cv Cath W/O FL  Result Date: 03/30/2023 INDICATION: Completed IV therapy. Request for removal of tunneled central venous catheter. EXAM: REMOVAL OF TUNNELED CENTRAL VENOUS CATHETER MEDICATIONS: None COMPLICATIONS: None  immediate. PROCEDURE: Informed written consent was obtained from the patient following an explanation of the procedure, risks, benefits and alternatives to treatment. A time out was performed prior to the initiation of the procedure. Aseptic technique was utilized including sterile gloves and hand hygiene. ChloraPrep was used to prep the patient's right neck, chest and existing catheter. Using only gentle traction the catheter was removed intact. Hemostasis was obtained with manual compression. A dressing was placed. The patient tolerated the procedure well without immediate post procedural complication. IMPRESSION: Successful removal of tunneled central venous catheter catheter. Procedure performed by: Corrin Parker, PA-C Electronically Signed   By: Judie Petit.  Shick M.D.   On: 03/30/2023 16:06   CT HEAD WO CONTRAST ( )  Result Date: 03/30/2023 CLINICAL DATA:  Mental status change, unknown cause EXAM: CT HEAD WITHOUT CONTRAST TECHNIQUE: Contiguous axial images were obtained from the base of the skull through the vertex without intravenous contrast. RADIATION DOSE REDUCTION: This exam was performed according to the departmental dose-optimization program which includes automated exposure control, adjustment of the mA and/or kV according to patient size and/or use of iterative reconstruction technique. COMPARISON:  CT head May 15, 24. FINDINGS: Brain: No evidence of acute infarction, hemorrhage, hydrocephalus, extra-axial collection or mass lesion/mass effect. Vascular: No hyperdense vessel identified. Skull: No acute fracture. Sinuses/Orbits: Clear sinuses.  No acute orbital findings. Other: No mastoid effusions. IMPRESSION: No evidence of acute intracranial abnormality. Electronically Signed   By: Feliberto Harts M.D.   On: 03/30/2023 14:45    Medications: Infusions:  [START ON 04/02/2023] DAPTOmycin (CUBICIN) 650 mg in sodium chloride 0.9 % IVPB      Scheduled Medications:  acetaminophen  1,000 mg Oral TID    acidophilus  2 capsule Oral TID   apixaban  2.5 mg Oral BID   calcitRIOL  0.5 mcg Oral Daily   calcium carbonate  1 tablet Oral Daily   Chlorhexidine Gluconate Cloth  6 each Topical Q0600   Chlorhexidine Gluconate Cloth  6 each Topical Q0600   darbepoetin (ARANESP) injection - NON-DIALYSIS  100 mcg Subcutaneous Q Fri-1800   diclofenac  1 patch Transdermal BID   DULoxetine  60 mg Oral Daily   feeding supplement (NEPRO CARB STEADY)  237 mL Oral TID BM   loratadine  10 mg Oral Daily   methocarbamol  500 mg Oral TID   midodrine  10 mg Oral Q M,W,F-HD   mometasone-formoterol  2 puff Inhalation BID   multivitamin  1 tablet Oral QHS   oxyCODONE  10 mg Oral Q12H   polyethylene glycol  17 g Oral BID   pregabalin  50 mg Oral Daily   senna-docusate  1  tablet Oral BID   sodium chloride flush  10-40 mL Intracatheter Q12H   sodium chloride flush  3 mL Intravenous Q12H    have reviewed scheduled and prn medications.  Physical Exam: General: staring off in bed-  conversation when prompted-   Heart:RRR, s1s2 nl Lungs:clear b/l, no crackle Abdomen:soft, Non-tender, non-distended Extremities:  edema better Dialysis Access: LIJ TDC, non tender  L BCF + T/B    [name] [name] A [name] [name] 04/01/2023,9:25 AM  LOS: 33 days

## 2023-04-01 NOTE — Progress Notes (Addendum)
Addendum  Patient continues to be hypotensive and lethargic since this morning.  Did get midodrine 10 mg x 1 dose approximately 2 hours ago despite which BPs still low, latest 84/47.  DD: Intravascular volume depletion versus septic.  Giving IV saline bolus 250 mL x 1 with close monitoring of respiratory status.  Transferring to progressive care unit.  Per palliative care team updates this morning, patient/family wish to pursue full code, full scope of care.  PCCM consulted to evaluate and may even need to go to ICU.  Updated ID (as per Dr. [name], recommends broadening IV antibiotic coverage and adding Cefepime), Nephrology and patient's RN of care.  Unable to reach spouse via phone in patient's room or on his cell phone.  PCCM saw the patient.  Dr. Tonia Brooms kindly called me back.  After 2 doses of Narcan, patient became more alert and blood pressures normalized.  Had already received 250 mL IV fluid bolus.  I came up to the floor, reassessed the patient.  Patient is more alert but quite encephalopathy, apart from her name, does not communicate, eyes closed, restless, tachypneic, wheezing, decreased BS's b/l, unable to get an adequate oxygen saturation, obtained 91% on 6 L after changing probes.  Mouth breathing, thereby changing to a nonrebreather mask.  Constantly groaning/moaning.  Concerned that she is quite unstable with high risk for respiratory fatigue/failure requiring intubation.  Cannot give her any opioids at this time given overdose as noted above.  Will DC OxyContin for now. Clinically not volume overloaded. Will also give a dose of IV Solumedrol. Recommend transferring to ICU.  Rapid response RN agrees.  Discussed with Dr. Tonia Brooms also agrees.  Updated spouse at bedside.  He reports lifelong non-smoker, no reported history of COPD or emphysema.  Apparently had done a sleep test.  Unclear why on as needed home oxygen, mention something about CHF.  Also since BP responded to Narcan, discontinued  Cefepime.    Marcellus Scott, MD,  FACP, FHM, [facility], [facility], [facility]   [facility] [place]     To contact the attending provider between 7A-7P or the covering provider during after hours 7P-7A, please log into the web site [email or web address] and access using universal [place] password for that web site. If you do not have the password, please call the hospital operator.

## 2023-04-01 NOTE — Progress Notes (Signed)
   03/31/23 2147  Assess: MEWS Score  Temp 99.5 F (37.5 C)  BP (!) 104/51  MAP (mmHg) 67  Pulse Rate (!) 107  Resp (!) 22  SpO2 92 %  O2 Device Nasal Cannula  O2 Flow Rate (L/min) 2 L/min  Assess: MEWS Score  MEWS Temp 0  MEWS Systolic 0  MEWS Pulse 1  MEWS RR 1  MEWS LOC 0  MEWS Score 2  MEWS Score Color Yellow  Assess: if the MEWS score is Yellow or Red  Were vital signs taken at a resting state? Yes  Focused Assessment No change from prior assessment  Does the patient meet 2 or more of the SIRS criteria? No  Does the patient have a confirmed or suspected source of infection? No  MEWS guidelines implemented  Yes, yellow  Treat  MEWS Interventions Considered administering scheduled or prn medications/treatments as ordered  Take Vital Signs  Increase Vital Sign Frequency  Yellow: Q2hr x1, continue Q4hrs until patient remains green for 12hrs  Escalate  MEWS: Escalate Yellow: Discuss with charge nurse and consider notifying provider and/or RRT  Notify: Charge Nurse/RN  Name of Charge Nurse/RN Notified Bev,RN  Provider Notification  Provider Name/Title A  Chavez,NP  Date Provider Notified 04/01/23  Time Provider Notified 0147  Method of Notification Page (secure chat)  Notification Reason Other (Comment) (yellow MEWS)  Provider response No new orders (bedtime meds given)  Date of Provider Response 04/01/23  Time of Provider Response 0148  Assess: SIRS CRITERIA  SIRS Temperature  0  SIRS Pulse 1  SIRS Respirations  1  SIRS WBC 0  SIRS Score Sum  2

## 2023-04-01 NOTE — Progress Notes (Signed)
Daily Progress Note   Patient Name: [name] [name]       Date: 04/01/2023 DOB: 07/07/1956  Age: 67 y.o. MRN#: [id number] Attending Physician: Elease Etienne, MD Primary Care Physician: Zola Button, Grayling Congress, DO Admit Date: 01/30/2023  Reason for Consultation/Follow-up: Establishing goals of care and Pain control  Subjective: Patient was sleeping in bed appears comfortable. Husband at bedside.  Length of Stay: 33  Current Medications: Scheduled Meds:   acetaminophen  1,000 mg Oral TID   acidophilus  2 capsule Oral TID   apixaban  2.5 mg Oral BID   calcitRIOL  0.5 mcg Oral Daily   calcium carbonate  1 tablet Oral Daily   Chlorhexidine Gluconate Cloth  6 each Topical Q0600   Chlorhexidine Gluconate Cloth  6 each Topical Q0600   darbepoetin (ARANESP) injection - NON-DIALYSIS  100 mcg Subcutaneous Q Fri-1800   diclofenac  1 patch Transdermal BID   DULoxetine  60 mg Oral Daily   feeding supplement (NEPRO CARB STEADY)  237 mL Oral TID BM   loratadine  10 mg Oral Daily   methocarbamol  500 mg Oral TID   midodrine  10 mg Oral BID WC   mometasone-formoterol  2 puff Inhalation BID   multivitamin  1 tablet Oral QHS   oxyCODONE  10 mg Oral Q12H   polyethylene glycol  17 g Oral BID   pregabalin  50 mg Oral Daily   senna-docusate  1 tablet Oral BID   sodium chloride flush  10-40 mL Intracatheter Q12H   sodium chloride flush  3 mL Intravenous Q12H    Continuous Infusions:  [START ON 04/02/2023] DAPTOmycin (CUBICIN) 650 mg in sodium chloride 0.9 % IVPB      PRN Meds: albuterol, alteplase, diazepam, heparin, hydrALAZINE, lidocaine (PF), lidocaine-prilocaine, naLOXone (NARCAN)  injection, nitroGLYCERIN, ondansetron (ZOFRAN) IV, mouth rinse, oxyCODONE,  pentafluoroprop-tetrafluoroeth  Physical Exam Constitutional:      General: She is sleeping.  Pulmonary:     Effort: Pulmonary effort is normal.  Skin:    General: Skin is warm and dry.  Neurological:     Mental Status: She is easily aroused.             Vital Signs: BP (!) 86/59   Pulse 82   Temp 97.9 F (36.6 C) (Axillary)   Resp  16   Ht 4\' 11"  (1.499 m)   Wt 78.8 kg   SpO2 95%   BMI 35.09 kg/m  SpO2: SpO2: 95 % O2 Device: O2 Device: Nasal Cannula O2 Flow Rate: O2 Flow Rate (L/min): 4 L/min  Intake/output summary:  Intake/Output Summary (Last 24 hours) at 04/01/2023 1016 Last data filed at 03/31/2023 2148 Gross per 24 hour  Intake 240 ml  Output 0 ml  Net 240 ml   LBM: Last BM Date : 04/28/23 Baseline Weight: Weight: 82.1 kg Most recent weight: Weight: 78.8 kg       Palliative Assessment/Data:40%      Patient Active Problem List   Diagnosis Date Noted   Chronic thoracic back pain 03/28/2023   Fever 03/23/2023   Chronic diastolic CHF (congestive heart failure) (HCC) 03/21/2023   Thrombocytopenia (HCC) 03/21/2023   ESRD on dialysis (HCC) 03/21/2023   Acute osteomyelitis of thoracic spine (HCC) 02/27/2023   Vertebral osteomyelitis (HCC) 01/28/2023   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   Osteomyelitis (HCC) 06/20/2022   Estrogen deficiency 05/25/2022   CKD (chronic kidney disease) stage 5, GFR less than 15 ml/min (HCC) 02/14/2022   Prolonged QT interval 01/07/2022   Streptococcal bacteremia    Osteomyelitis of left foot (HCC) 10/16/2021   Diabetic foot ulcers (HCC) 10/16/2021   Portal hypertension (HCC) 08/16/2021   Eczema of scalp 06/27/2021   Hypothyroidism 06/27/2021   Pruritus 06/27/2021   Seasonal allergic rhinitis due to pollen 02/02/2021   PTSD (post-traumatic stress disorder)    Osteoarthritis    MRSA (methicillin resistant Staphylococcus aureus)    Hypertension    High cholesterol    Hepatic steatosis    GERD (gastroesophageal reflux  disease)    Fibromyalgia    Episode of visual loss of left eye    Depression    Constipation    Cataract    Bulging lumbar disc    Asthma    Anxiety    Anemia    Tinea capitis 10/11/2020   Tinea corporis 10/11/2020   Hip pain 05/16/2020   IDA (iron deficiency anemia) 05/04/2020   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) 03/16/2020   S/P lumbar fusion 03/04/2020   Spinal stenosis of lumbar region 01/22/2020   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) 01/21/2020   Compression fracture of L1 lumbar vertebra (HCC) 11/17/2019   Fracture of multiple ribs 11/17/2019   Nausea & vomiting 11/15/2019   Diarrhea 09/04/2019   Pressure injury of skin 04/17/2019   Septic arthritis of elbow, right (HCC) 04/17/2019   Retinopathy 04/17/2019   Renal transplant, 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   Sleep apnea 11/16/2017   Diabetic foot infection (HCC) 04/06/2017   Type 2 diabetes mellitus with hyperglycemia, with long-term current use of insulin (HCC) 04/06/2017   Neuropathy 05/05/2015   Diabetic peripheral neuropathy (HCC) 01/12/2015   CKD (chronic kidney disease), stage III (HCC) 05/27/2014   History of MI (myocardial infarction) 10/06/2013   Chest pain 10/06/2013   Nausea and vomiting 10/06/2013   Obesity (BMI 30-39.9) 09/20/2013   Multinodular goiter 04/17/2013   Normocytic anemia 02/03/2013   CAD (coronary artery disease) 01/11/2012   UTI (urinary tract infection) 08/05/2010   Hepatic cirrhosis (HCC) 07/06/2010   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   Hyperlipidemia 06/03/2008   Essential hypertension 12/23/2006   Acute MI [employer]) 2007    Palliative Care Assessment & Plan   Patient Profile: [name] is a 67 y.o. adult with medical history significant of CKD 5,diabetes, neuropathy,  thrombocytopenia, OSA, osteomyelitis, diabetic foot infections, anemia, hypothyroidism, hypertension, hyperlipidemia, CAD, cirrhosis, asthma, spinal stenosis, status post lumbar  fusion, PTSD, depression, anxiety, obesity, fibromyalgia, DVT, cataracts presenting with worsening back pain, nausea, vomiting. She presented to the hospital for back pain, abdominal pain, nausea, and vomiting and was admitted on 02/20/2023.   Assessment: Spoke with the patient's husband [name] while the patient slept. We discussed her goal to get home. [name] says Emaley's "whole life is at home." He notes their grandson is getting married in August and he knows she wants to be able to attend. We discussed her fevers and pain management. We discussed utilizing non-pharmacologic methods such as repositioning, use of heat pad, and massage for pain management. We discussed the importance of the patient tolerating sitting in the chair for HD tomorrow.  We did spend time discussing code status. We discussed that with her chronic conditions a CPR attempt would be unlikely successful and would have poor outcomes. [name] notes his wife used to be DNR. They have discussed it since her hospitalization and she would like to remain FULL code.  Recommendations/Plan: Full code/ Full scope of care Use of Kpad PRN pain medication before dialysis tomorrow if available Continued conversations regarding GOC Continued PMT support   Goals of Care and Additional Recommendations: Limitations on Scope of Treatment: Full Scope Treatment  Code Status:    Code Status Orders  (From admission, onward)           Start     Ordered   02/23/2023 1448  Full code  Continuous       Question:  By:  Answer:  Consent: discussion documented in EHR   02/18/2023 1449           Code Status History     Date Active Date Inactive Code Status Order ID Comments User Context   01/28/2023 2336 02/04/2023 1730 Full Code [id number]  John Giovanni, MD ED   06/21/2022 0025 07/11/2022 2159 Full Code [id number]  Carollee Herter, DO ED   01/07/2022 0511 01/09/2022 2045 Full Code [id number]  Howerter, Chaney Born, DO Inpatient   10/17/2021 0420  10/22/2021 1928 Full Code [id number]  Chotiner, Claudean Severance, MD ED   02/18/2020 2241 02/26/2020 2334 Full Code [id number]  Lewie Chamber, MD Inpatient   01/29/2020 1704 02/18/2020 2044 Full Code [id number]  Charlton Amor, PA-C Inpatient   01/29/2020 1704 01/29/2020 1704 Full Code [id number]  Charlton Amor, PA-C Inpatient   01/22/2020 2141 01/29/2020 1650 Full Code [id number]  Bedelia Person, MD Inpatient   01/21/2020 0012 01/22/2020 2141 Full Code [id number]  Eduard Clos, MD Inpatient   11/15/2019 2200 11/18/2019 2226 Full Code [id number]  Pearson Grippe, MD ED   04/15/2019 2122 04/21/2019 2320 Full Code [id number]  Rometta Emery, MD Inpatient   04/06/2017 2004 04/11/2017 2109 Full Code [id number]  Alberteen Sam, MD Inpatient   09/29/2016 1831 10/02/2016 1643 Full Code [id number]  Nadara Mustard, MD Inpatient   03/19/2013 1507 03/21/2013 1812 Full Code [id number]  Nadara Mustard, MD Inpatient   02/10/2013 2124 02/20/2013 1859 Full Code [id number]  Nadara Mustard, MD Inpatient   02/03/2013 2137 02/10/2013 2124 Full Code [id number]  Cristal Ford, MD Inpatient   01/11/2012 0612 01/13/2012 1645 Full Code [id number]  Wilder Glade, RN Inpatient       Prognosis:  Unable to determine  Discharge Planning: To Be Determined  Care plan was discussed with bedside RN and Dr. Waymon Amato  Thank you  for allowing the Palliative Medicine Team to assist in the care of this patient.  Time spent: 60 min   Detailed review of medical records ( labs, imaging, vital signs), medically appropriate exam, counseling and education to patient's husband Laban Emperor, documenting clinical information, medication management, coordination of care.     Sherryll Burger, NP  Please contact Palliative Medicine Team phone at [phone] for questions and concerns.

## 2023-04-02 DIAGNOSIS — M4624 Osteomyelitis of vertebra, thoracic region: Secondary | ICD-10-CM | POA: Diagnosis not present

## 2023-04-02 DIAGNOSIS — G9340 Encephalopathy, unspecified: Secondary | ICD-10-CM

## 2023-04-02 DIAGNOSIS — G928 Other toxic encephalopathy: Secondary | ICD-10-CM | POA: Diagnosis not present

## 2023-04-02 LAB — CBC WITH DIFFERENTIAL/PLATELET
Abs Immature Granulocytes: 0 10*3/uL (ref 0.00–0.07)
Basophils Absolute: 0 10*3/uL (ref 0.0–0.1)
Basophils Relative: 0 %
Eosinophils Absolute: 0 10*3/uL (ref 0.0–0.5)
Eosinophils Relative: 0 %
HCT: 25.7 % — ABNORMAL LOW (ref 36.0–46.0)
Hemoglobin: 7.8 g/dL — ABNORMAL LOW (ref 12.0–15.0)
Lymphocytes Relative: 3 %
Lymphs Abs: 0.5 10*3/uL — ABNORMAL LOW (ref 0.7–4.0)
MCH: 27.9 pg (ref 26.0–34.0)
MCHC: 30.4 g/dL (ref 30.0–36.0)
MCV: 91.8 fL (ref 80.0–100.0)
Monocytes Absolute: 0.4 10*3/uL (ref 0.1–1.0)
Monocytes Relative: 2 %
Neutro Abs: 17.3 10*3/uL — ABNORMAL HIGH (ref 1.7–7.7)
Neutrophils Relative %: 95 %
Platelets: 207 10*3/uL (ref 150–400)
RBC: 2.8 MIL/uL — ABNORMAL LOW (ref 3.87–5.11)
RDW: 17.4 % — ABNORMAL HIGH (ref 11.5–15.5)
WBC: 18.2 10*3/uL — ABNORMAL HIGH (ref 4.0–10.5)
nRBC: 0 % (ref 0.0–0.2)
nRBC: 0 /100 WBC

## 2023-04-02 LAB — RENAL FUNCTION PANEL
Albumin: 2.7 g/dL — ABNORMAL LOW (ref 3.5–5.0)
Anion gap: 19 — ABNORMAL HIGH (ref 5–15)
BUN: 60 mg/dL — ABNORMAL HIGH (ref 8–23)
CO2: 22 mmol/L (ref 22–32)
Calcium: 8.4 mg/dL — ABNORMAL LOW (ref 8.9–10.3)
Chloride: 89 mmol/L — ABNORMAL LOW (ref 98–111)
Creatinine, Ser: 6.33 mg/dL — ABNORMAL HIGH (ref 0.44–1.00)
GFR, Estimated: 7 mL/min — ABNORMAL LOW
Glucose, Bld: 140 mg/dL — ABNORMAL HIGH (ref 70–99)
Phosphorus: 5.3 mg/dL — ABNORMAL HIGH (ref 2.5–4.6)
Potassium: 4.7 mmol/L (ref 3.5–5.1)
Sodium: 130 mmol/L — ABNORMAL LOW (ref 135–145)

## 2023-04-02 LAB — CBC
HCT: 24.8 % — ABNORMAL LOW (ref 36.0–46.0)
Hemoglobin: 7.7 g/dL — ABNORMAL LOW (ref 12.0–15.0)
MCH: 27.9 pg (ref 26.0–34.0)
MCHC: 31 g/dL (ref 30.0–36.0)
MCV: 89.9 fL (ref 80.0–100.0)
Platelets: 206 10*3/uL (ref 150–400)
RBC: 2.76 MIL/uL — ABNORMAL LOW (ref 3.87–5.11)
RDW: 17.4 % — ABNORMAL HIGH (ref 11.5–15.5)
WBC: 17.6 10*3/uL — ABNORMAL HIGH (ref 4.0–10.5)
nRBC: 0 % (ref 0.0–0.2)

## 2023-04-02 LAB — GLUCOSE, CAPILLARY
Glucose-Capillary: 78 mg/dL (ref 70–99)
Glucose-Capillary: 144 mg/dL — ABNORMAL HIGH (ref 70–99)
Glucose-Capillary: 152 mg/dL — ABNORMAL HIGH (ref 70–99)
Glucose-Capillary: 145 mg/dL — ABNORMAL HIGH (ref 70–99)
Glucose-Capillary: 129 mg/dL — ABNORMAL HIGH (ref 70–99)
Glucose-Capillary: 79 mg/dL (ref 70–99)

## 2023-04-02 LAB — MRSA NEXT GEN BY PCR, NASAL: MRSA by PCR Next Gen: NOT DETECTED

## 2023-04-02 LAB — CULTURE, BLOOD (ROUTINE X 2): Culture: NO GROWTH

## 2023-04-02 LAB — HEPATITIS PANEL, ACUTE
HCV Ab: NONREACTIVE
Hep A IgM: NONREACTIVE
Hep B C IgM: NONREACTIVE
Hepatitis B Surface Ag: NONREACTIVE

## 2023-04-02 MED ORDER — BUDESONIDE 0.25 MG/2ML IN SUSP
0.2500 mg | Freq: Two times a day (BID) | RESPIRATORY_TRACT | Status: DC
  Administered 2023-04-02 – 2023-04-04 (×6): 0.25 mg via RESPIRATORY_TRACT
  Filled 2023-04-02 (×6): qty 2

## 2023-04-02 MED ORDER — ORAL CARE MOUTH RINSE
15.0000 mL | OROMUCOSAL | Status: DC
  Administered 2023-04-02 – 2023-04-04 (×9): 15 mL via OROMUCOSAL

## 2023-04-02 MED ORDER — VANCOMYCIN HCL 750 MG/150ML IV SOLN: 750.0000 mg | INTRAVENOUS | Status: DC

## 2023-04-02 MED ORDER — ALBUMIN HUMAN 25 % IV SOLN
INTRAVENOUS | Status: AC
  Administered 2023-04-02: 25 g
  Filled 2023-04-02: qty 200

## 2023-04-02 MED ORDER — ARFORMOTEROL TARTRATE 15 MCG/2ML IN NEBU
15.0000 ug | INHALATION_SOLUTION | Freq: Two times a day (BID) | RESPIRATORY_TRACT | Status: DC
  Administered 2023-04-02 – 2023-04-05 (×7): 15 ug via RESPIRATORY_TRACT
  Filled 2023-04-02 (×6): qty 2

## 2023-04-02 MED ORDER — ORAL CARE MOUTH RINSE: 15.0000 mL | OROMUCOSAL | Status: DC | PRN

## 2023-04-02 MED ORDER — VANCOMYCIN HCL 2000 MG/400ML IV SOLN
2000.0000 mg | Freq: Once | INTRAVENOUS | Status: AC
  Administered 2023-04-02: 2000 mg via INTRAVENOUS
  Filled 2023-04-02: qty 400

## 2023-04-02 NOTE — Progress Notes (Signed)
Pt placed back on BIPAP due to increased WOB. Pt tolerating BIPAP well at this time.   04/02/23 2001  Vent Select  Invasive or Noninvasive Noninvasive  Adult Vent Y  Adult Ventilator Settings  Vent Type Servo i  Vent Mode PCV;BIPAP  Set Rate 16 bmp  FiO2 (%) 40 %  I Time 0.9 Sec(s)  IPAP 15 cmH20  EPAP 5 cmH20  Pressure Control 10 cmH20  PEEP 5 cmH20  Adult Ventilator Measurements  Peak Airway Pressure 22 L/min  Mean Airway Pressure 10 cmH20  Resp Rate Spontaneous 8 br/min  Resp Rate Total 24 br/min  Exhaled Vt 738 mL  Measured Ve 12.8 L  I:E Ratio Measured 1:1.4  Auto PEEP 0 cmH20  Total PEEP 5 cmH20  SpO2 93 %  Adult Ventilator Alarms  Alarms On Y  Ve High Alarm 22 L/min  Ve Low Alarm 4 L/min  Resp Rate High Alarm 38 br/min  Resp Rate Low Alarm 10  PEEP Low Alarm 3 cmH2O  Press High Alarm 30 cmH2O  VAP Prevention  HOB> 30 Degrees Y  Equipment wiped down Yes  Breath Sounds  Bilateral Breath Sounds Diminished

## 2023-04-02 NOTE — Progress Notes (Signed)
Daily Progress Note   Patient Name: [name] [name]       Date: 04/02/2023 DOB: 12-Sep-1956  Age: 67 y.o. MRN#: [id number] Attending Physician: Coralyn Helling, MD Primary Care Physician: Zola Button, Grayling Congress, DO Admit Date: 01/29/2023  Reason for Consultation/Follow-up: Establishing goals of care and Pain control  Subjective: Patient was in bed awake with bipap on. No family at bedside.  Length of Stay: 34  Current Medications: Scheduled Meds:  . acetaminophen  1,000 mg Oral TID  . acidophilus  2 capsule Oral TID  . apixaban  2.5 mg Oral BID  . arformoterol  15 mcg Nebulization BID  . budesonide (PULMICORT) nebulizer solution  0.25 mg Nebulization BID  . calcitRIOL  0.5 mcg Oral Daily  . calcium carbonate  1 tablet Oral Daily  . Chlorhexidine Gluconate Cloth  6 each Topical Q0600  . darbepoetin (ARANESP) injection - NON-DIALYSIS  100 mcg Subcutaneous Q Fri-1800  . diclofenac  1 patch Transdermal BID  . DULoxetine  60 mg Oral Daily  . feeding supplement (NEPRO CARB STEADY)  237 mL Oral TID BM  . loratadine  10 mg Oral Daily  . methocarbamol  500 mg Oral TID  . midodrine  10 mg Oral Q8H  . multivitamin  1 tablet Oral QHS  . polyethylene glycol  17 g Oral BID  . pregabalin  50 mg Oral Daily  . senna-docusate  1 tablet Oral BID  . sodium chloride flush  10-40 mL Intracatheter Q12H  . sodium chloride flush  3 mL Intravenous Q12H    Continuous Infusions: . sodium chloride 10 mL/hr at 04/02/23 0800  . sodium chloride    . albumin human    . DAPTOmycin (CUBICIN) 650 mg in sodium chloride 0.9 % IVPB    . norepinephrine (LEVOPHED) Adult infusion 4 mcg/min (04/02/23 1247)    PRN Meds: sodium chloride, albumin human, albuterol, alteplase, heparin, hydrALAZINE, lidocaine (PF),  lidocaine-prilocaine, naLOXone (NARCAN)  injection, nitroGLYCERIN, ondansetron (ZOFRAN) IV, mouth rinse, oxyCODONE, pentafluoroprop-tetrafluoroeth  Physical Exam Vitals reviewed.  Constitutional:      General: She is awake.     Appearance: She is ill-appearing.  Pulmonary:     Comments: bipap Neurological:     Mental Status: She is easily aroused.  Vital Signs: BP 112/67   Pulse 75   Temp (!) 96.6 F (35.9 C) (Axillary)   Resp 19   Ht 4\' 11"  (1.499 m)   Wt 78.8 kg   SpO2 97%   BMI 35.09 kg/m  SpO2: SpO2: 97 % O2 Device: O2 Device: High Flow Nasal Cannula O2 Flow Rate: O2 Flow Rate (L/min): 6 L/min  Intake/output summary:  Intake/Output Summary (Last 24 hours) at 04/02/2023 1339 Last data filed at 04/02/2023 0800 Gross per 24 hour  Intake 118.47 ml  Output --  Net 118.47 ml   LBM: Last BM Date : 03/28/23 Baseline Weight: Weight: 82.1 kg Most recent weight: Weight: 78.8 kg       Palliative Assessment/Data:20%      Patient Active Problem List   Diagnosis Date Noted  . Acute encephalopathy 04/02/2023  . Chronic thoracic back pain 03/28/2023  . Fever 03/23/2023  . Chronic diastolic CHF (congestive heart failure) (HCC) 03/21/2023  . Thrombocytopenia (HCC) 03/21/2023  . ESRD on dialysis (HCC) 03/21/2023  . Acute osteomyelitis of thoracic spine (HCC) 02/27/2023  . Vertebral osteomyelitis (HCC) 01/28/2023  . 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 Osteomyelitis (HCC) 06/20/2022  . Estrogen deficiency 05/25/2022  . CKD (chronic kidney disease) stage 5, GFR less than 15 ml/min (HCC) 02/14/2022  . Prolonged QT interval 01/07/2022  . Streptococcal bacteremia   . Osteomyelitis of left foot (HCC) 10/16/2021  . Diabetic foot ulcers (HCC) 10/16/2021  . Portal hypertension (HCC) 08/16/2021  . Eczema of scalp 06/27/2021  . Hypothyroidism 06/27/2021  . Pruritus 06/27/2021  . Seasonal allergic rhinitis due to pollen 02/02/2021  . PTSD (post-traumatic stress  disorder)   . Osteoarthritis   . MRSA (methicillin resistant Staphylococcus aureus)   . Hypertension   . High cholesterol   . Hepatic steatosis   . GERD (gastroesophageal reflux disease)   . Fibromyalgia   . Episode of visual loss of left eye   . Depression   . Constipation   . Cataract   . Bulging lumbar disc   . Asthma   . Anxiety   . Anemia   . Tinea capitis 10/11/2020  . Tinea corporis 10/11/2020  . Hip pain 05/16/2020  . IDA (iron deficiency anemia) 05/04/2020  . 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 S/P lumbar fusion 03/04/2020  . Spinal stenosis of lumbar region 01/22/2020  . 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 Compression fracture of L1 lumbar vertebra (HCC) 11/17/2019  . Fracture of multiple ribs 11/17/2019  . Nausea & vomiting 11/15/2019  . Diarrhea 09/04/2019  . Pressure injury of skin 04/17/2019  . Septic arthritis of elbow, right (HCC) 04/17/2019  . 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 Sleep apnea 11/16/2017  . Diabetic foot infection (HCC) 04/06/2017  . Type 2 diabetes mellitus with hyperglycemia, with long-term current use of insulin (HCC) 04/06/2017  . Neuropathy 05/05/2015  . Diabetic peripheral neuropathy (HCC) 01/12/2015  . CKD (chronic kidney disease), stage III (HCC) 05/27/2014  . History of MI (myocardial infarction) 10/06/2013  . Chest pain 10/06/2013  . Nausea and vomiting 10/06/2013  . Obesity (BMI 30-39.9) 09/20/2013  . 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 Normocytic anemia 02/03/2013  . CAD (coronary artery disease) 01/11/2012  . UTI (urinary tract infection) 08/05/2010  . Hepatic cirrhosis (HCC) 07/06/2010  . 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  . Hyperlipidemia 06/03/2008  . Essential hypertension 12/23/2006  . Acute MI [facility]) 2007  Palliative Care Assessment & Plan   Patient Profile: [name] [name] is a 67 y.o. adult with medical  history significant of CKD 5,diabetes, neuropathy, thrombocytopenia, OSA, osteomyelitis, diabetic foot infections, anemia, hypothyroidism, hypertension, hyperlipidemia, CAD, cirrhosis, asthma, spinal stenosis, status post lumbar fusion, PTSD, depression, anxiety, obesity, fibromyalgia, DVT, cataracts presenting with worsening back pain, nausea, vomiting. She presented to the hospital for back pain, abdominal pain, nausea, and vomiting and was admitted on 02/05/2023.   Assessment: Spoke with the patient's husband [name] by phone. We discussed the patient's move to ICU. I let him know she was becoming more alert with bipap and the dialysis RN was getting ready to start dialysis. We discussed that the immediate goals of treatment are to keep her hemodynamically stable while hoping dialysis will help her become more clear.  I told him I was concerned that she may not be able to tolerate outpatient HD, but that we should take things one day at a time. He was grateful for the call and the update.  Recommendations/Plan: Full code/ Full scope of care Continued conversations regarding GOC Continued PMT support  Goals of Care and Additional Recommendations: Limitations on Scope of Treatment: Full Scope Treatment  Code Status:    Code Status Orders  (From admission, onward)           Start     Ordered   02/18/2023 1448  Full code  Continuous       Question:  By:  Answer:  Consent: discussion documented in EHR   02/09/2023 1449           Code Status History     Date Active Date Inactive Code Status Order ID Comments User Context   01/28/2023 2336 02/04/2023 1730 Full Code [id number]  John Giovanni, MD ED   06/21/2022 0025 07/11/2022 2159 Full Code [id number]  Carollee Herter, DO ED   01/07/2022 0511 01/09/2022 2045 Full Code [id number]  Howerter, Chaney Born, DO Inpatient   10/17/2021 0420 10/22/2021 1928 Full Code [id number]  Chotiner, Claudean Severance, MD ED   02/18/2020 2241 02/26/2020 2334 Full Code [id number]   Lewie Chamber, MD Inpatient   01/29/2020 1704 02/18/2020 2044 Full Code [id number]  Charlton Amor, PA-C Inpatient   01/29/2020 1704 01/29/2020 1704 Full Code [id number]  Charlton Amor, PA-C Inpatient   01/22/2020 2141 01/29/2020 1650 Full Code [id number]  Bedelia Person, MD Inpatient   01/21/2020 0012 01/22/2020 2141 Full Code [id number]  Eduard Clos, MD Inpatient   11/15/2019 2200 11/18/2019 2226 Full Code [id number]  Pearson Grippe, MD ED   04/15/2019 2122 04/21/2019 2320 Full Code [id number]  Rometta Emery, MD Inpatient   04/06/2017 2004 04/11/2017 2109 Full Code [id number]  Alberteen Sam, MD Inpatient   09/29/2016 1831 10/02/2016 1643 Full Code [id number]  Nadara Mustard, MD Inpatient   03/19/2013 1507 03/21/2013 1812 Full Code [id number]  Nadara Mustard, MD Inpatient   02/10/2013 2124 02/20/2013 1859 Full Code [id number]  Nadara Mustard, MD Inpatient   02/03/2013 2137 02/10/2013 2124 Full Code [id number]  Cristal Ford, MD Inpatient   01/11/2012 0612 01/13/2012 1645 Full Code [id number]  Wilder Glade, RN Inpatient       Prognosis:  Unable to determine  Discharge Planning: To Be Determined  Care plan was discussed with Dr. Craige Cotta.  Thank you for allowing the Palliative Medicine Team to assist in the care of this patient.  Time spent: 50 min   Detailed review of medical records (  labs, imaging, vital signs), medically appropriate exam, counseling and education to patient's husband Laban Emperor, documenting clinical information, medication management, coordination of care.   Sherryll Burger, NP  Please contact Palliative Medicine Team phone at [phone] for questions and concerns.

## 2023-04-02 NOTE — Progress Notes (Signed)
Pharmacy Antibiotic Note  [name] [name] is a 67 y.o. adult admitted on 02/12/2023 with MRSE discitis.  Pharmacy has been consulted to transition Daptomycin to Vancomycin in the setting of new ESRD.  The patient just started an HD session in her room this afternoon. Last Daptomycin dose was given after Friday, 6/1 HD session. Last Vancomycin noted to be 1g x 1 on 5/15 - will plan to load and start HD dosing.   Plan: - Vancomycin 2g IV x 1 dose to load followed by 750 mg IV qHD-MWF - Will continue to follow HD schedule/duration, culture results, LOT, and antibiotic de-escalation plans   Height: 4\' 11"  (149.9 cm) Weight: 78.8 kg (173 lb 11.6 oz) IBW/kg (Calculated) : 43.2  Temp (24hrs), Avg:97.8 F (36.6 C), Min:96.6 F (35.9 C), Max:98.3 F (36.8 C)  Recent Labs  Lab 03/26/23 1620 03/27/23 0400 03/28/23 0314 03/29/23 0337 03/29/23 1552 03/30/23 0305 03/31/23 0056 04/01/23 0109 04/02/23 0054 04/02/23 0855  WBC 4.8  --  8.6  --  6.8  --   --   --   --  17.6*  CREATININE  --    < > 4.72*   < > 4.36* 5.15* 3.63* 5.13* 6.33*  --    < > = values in this interval not displayed.    Estimated Creatinine Clearance (by C-G formula based on SCr of 6.33 mg/dL (H)) Female: 7.8 mL/min (A) Female: 9.6 mL/min (A)    Allergies  Allergen Reactions   Bee Pollen Anaphylaxis   Broccoli [Brassica Oleracea] Anaphylaxis and Hives   Fish-Derived Products Anaphylaxis and Hives   Mushroom Extract Complex Anaphylaxis   Penicillins Anaphylaxis    **Tolerated cefepime March 2021 Did it involve swelling of the face/tongue/throat, SOB, or low BP? Yes Did it involve sudden or severe rash/hives, skin peeling, or any reaction on the inside of your mouth or nose? No Did you need to seek medical attention at a hospital or doctor's office? Yes When did it last happen?  A few months ago If all above answers are "NO", may proceed with cephalosporin use.   Rosemary Oil Anaphylaxis   Shellfish Allergy  Anaphylaxis and Hives   Tomato Anaphylaxis and Hives    Raw tomatoes only, can tolerate if cooked   Acetaminophen Other (See Comments)    GI upset   Aloe Vera Hives   Doxycycline Diarrhea   Acyclovir And Related Other (See Comments)    Unknown reaction   Naproxen Hives    Thank you for allowing pharmacy to be a part of this patient's care.  Georgina Pillion, PharmD, BCPS Infectious Diseases Clinical Pharmacist 04/02/2023 2:10 PM   **Pharmacist phone directory can now be found on [email or web address] ([email or web address]).  Listed under [facility].

## 2023-04-02 NOTE — Progress Notes (Signed)
PT Cancellation Note  Patient Details Name: [name] [name] MRN: [id number] DOB: [birth year]   Cancelled Treatment:    Reason Eval/Treat Not Completed: Patient at procedure or test/unavailable (Pt in HD therefore will check back tomorrow.)   Bevelyn Buckles 04/02/2023, 2:11 PM Kyrah Schiro M,PT Acute Rehab Services [phone]

## 2023-04-02 NOTE — Telephone Encounter (Signed)
Patient in ICU

## 2023-04-02 NOTE — Progress Notes (Signed)
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 lethargy and somnolence and on 6/2 got worse with bradypnea, responded somewhat to couple doses of Narcan but then was still in respiratory distress and AMS, high risk for respiratory failure, transferred to ICU under CCM care.  Subjective Overnight events noted.  Discussed with patient's night nurse and day nurse that is assuming care today.  Intermittent alertness with orientation last night but mostly has been lethargic.  Ongoing intermittent hypotension last night and was almost started on norepinephrine but not yet.  Objective Patient extremely somnolent/lethargic, bradypnea with respiratory rates of 8-9/min and on brief wakefulness goes up to 11-12/min with apneic spells.  On HFNC oxygen.  BP of 116/61, pulse in the 70/min sinus rhythm on bedside telemetry.  Saturating in the high 90s.  RS: Clear to auscultation.  Bradycardia make as noted above. CVS: S1 and S2 heard, RRR.  No JVD, murmurs or pedal edema.   CNS: Extremely drowsy and lethargic.  To repeated calls, will very briefly open eyes for a couple seconds, mumble incomprehensibly and drifts back to sleep.  Does not follow instructions.  No focal deficits.  Labs: ABG last evening with normal pH, pCO2 but reduced pO2 at 59.  BMP from today noted, consistent with ESRD.  Assessment and plan:  Acute toxic metabolic encephalopathy: Likely multifactorial related to polypharmacy, ongoing infection.  Despite cutting back on opioids and other meds, had ongoing mental status changes, transiently responded to 2 doses of  Narcan yesterday and got a third dose last evening.  Last received opioids on 6/1 and since then, OxyContin has been discontinued.  Did get reduced dose of Valium 1 mg last evening, likely related to agitation from opioid withdrawal from Narcan.  Recommend holding all sedative medications.  Could consider repeating Narcan.  Hemodialysis for clearance of some of these medications should help.  Patient needs to be monitored closely in the ICU due to high risk for respiratory failure and need for intubation/mechanical ventilation.  Acute discitis/osteomyelitis of T4-5/ongoing fevers: See prior discussions.  Plan is for HD today and if has recurrent fevers then plan is to remove the HD catheter, line holiday for couple of days prior to reinsertion of a new HD catheter.  Acute on chronic pain: Likely secondary to above.  Difficult situation.  Currently does not appear in any pain.  However once her mental status improves, will likely start having more pain and will have to be judicious with reinitiation of her pain meds.  Respiratory difficulty: As per discussion with spouse yesterday, no history of COPD, emphysema.  Lifelong non-smoker.  Reportedly tested for OSA but unclear as to results.  Was on as needed home oxygen PTA.  Trial of IV Solu-Medrol yesterday.  No wheezing today.  Patient is unstable and appropriate for ICU.  TRH will sign off at this time.  Kindly contact TRH for any further assistance.  Marcellus Scott, MD,  FACP, FHM, [facility], [facility], [facility]   [facility] Advisor [place]     To contact the attending provider between 7A-7P or the covering provider during after hours 7P-7A, please log into the web site [email or web address] and access using  universal [place] password for that web site. If you do not have the password, please call the hospital operator.

## 2023-04-02 NOTE — TOC Progression Note (Signed)
Transition of Care [facility]) - Progression Note    Patient Details  Name: [name] [name] MRN: [id number] Date of Birth: 15-Aug-1956  Transition of Care [facility]) CM/SW Contact  Tom-Johnson, Hershal Coria, RN Phone Number: 04/02/2023, 5:13 PM  Clinical Narrative:     Patient transferred to ICU over the weekend for Somnolence/Lethargy and Bradypnea. Currently on 6L HFNC O2.  Palliative, ID and Nephrology following.  CM received call from [facility] with [employer] [phone]) checking on the status and discharge plans for patient and stating to contact her if needing help to find care.  CM will continue to follow as patient progresses with care towards discharge.      Expected Discharge Plan: Home w Home Health Services Barriers to Discharge: Continued Medical Work up  Expected Discharge Plan and Services   Discharge Planning Services: CM Consult Post Acute Care Choice: Home Health Living arrangements for the past 2 months: Single Family Home                 DME Arranged: N/A DME Agency: NA       HH Arranged: RN, PT HH Agency: [facility] Date HH Agency Contacted: 03/22/23 Time HH Agency Contacted: 1205 Representative spoke with at [facility] Agency: Kandee Keen   Social Determinants of Health (SDOH) Interventions SDOH Screenings   Food Insecurity: No Food Insecurity (02/22/2023)  Housing: Low Risk  (02/22/2023)  Transportation Needs: No Transportation Needs (02/15/2023)  Utilities: Not At Risk (02/22/2023)  Alcohol Screen: Low Risk  (12/30/2021)  Depression (PHQ2-9): Low Risk  (08/23/2022)  Financial Resource Strain: Low Risk  (12/30/2021)  Physical Activity: Inactive (12/30/2021)  Social Connections: Socially Integrated (12/30/2021)  Stress: Stress Concern Present (12/30/2021)  Tobacco Use: Low Risk  (03/30/2023)    Readmission Risk Interventions    06/23/2022    2:13 PM  Readmission Risk Prevention Plan  Transportation Screening Complete  PCP or Specialist  Appt within 3-5 Days Complete  HRI or Home Care Consult Complete  Social Work Consult for Recovery Care Planning/Counseling Complete  Palliative Care Screening Not Applicable  Medication Review Oceanographer) Complete
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
## 2023-04-02 NOTE — Progress Notes (Signed)
[facility]    Date of Admission:  02/20/2023      ID: [name] [name] is a 67 y.o. 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)   IDA (iron deficiency anemia)   PTSD (post-traumatic stress disorder)   Hypertension   High cholesterol   GERD (gastroesophageal reflux disease)   Fibromyalgia   Depression   Asthma   Anxiety   Anemia   Hypothyroidism   CKD (chronic kidney disease) stage 5, GFR less than 15 ml/min (HCC)   Osteomyelitis (HCC)   Chronic diastolic CHF (congestive heart failure) (HCC)   Thrombocytopenia (HCC)   ESRD on dialysis (HCC)   Fever   Chronic thoracic back pain   Acute encephalopathy    Subjective: Still solomnent, not following direction. No diarrhea. No fever  Medications:   acetaminophen  1,000 mg Oral TID   acidophilus  2 capsule Oral TID   apixaban  2.5 mg Oral BID   arformoterol  15 mcg Nebulization BID   budesonide (PULMICORT) nebulizer solution  0.25 mg Nebulization BID   calcitRIOL  0.5 mcg Oral Daily   calcium carbonate  1 tablet Oral Daily   Chlorhexidine Gluconate Cloth  6 each Topical Q0600   darbepoetin (ARANESP)  injection - NON-DIALYSIS  100 mcg Subcutaneous Q Fri-1800   diclofenac  1 patch Transdermal BID   DULoxetine  60 mg Oral Daily   feeding supplement (NEPRO CARB STEADY)  237 mL Oral TID BM   loratadine  10 mg Oral Daily   methocarbamol  500 mg Oral TID   midodrine  10 mg Oral Q8H   multivitamin  1 tablet Oral QHS   polyethylene glycol  17 g Oral BID   pregabalin  50 mg Oral Daily   senna-docusate  1 tablet Oral BID   sodium chloride flush  10-40 mL Intracatheter Q12H   sodium chloride flush  3 mL Intravenous Q12H    Objective: Vital signs in last 24 hours: Temp:  [96.6 F (35.9 C)-98.3 F (36.8 C)] 96.6 F (35.9 C) (06/03 1132) Pulse Rate:  [72-123] 75 (06/03 0900) Resp:  [8-40] 19 (06/03 0900) BP: (67-185)/(39-86) 112/67 (06/03 0900) SpO2:  [84 %-100 %] 97 % (06/03 0900) FiO2 (%):  [40 %] 40 % (06/03 1100) Weight:  [78.7 kg-78.8 kg] 78.8 kg (06/03 0500)  Physical Exam  Constitutional:  oriented to person, only appears older than stated age and well-nourished. No distress.  HENT: [place]/AT, PERRLA, no scleral icterus Mouth/Throat: Oropharynx is clear and moist. No oropharyngeal exudate.  Cardiovascular: Normal rate, regular rhythm and normal heart sounds. Exam reveals no gallop and no friction rub.  No murmur heard.  Pulmonary/Chest: Effort normal and  breath sounds normal. No respiratory distress.  has no wheezes.  Neck = supple, no nuchal rigidity Abdominal: Soft. Bowel sounds are normal.  exhibits no distension. There is no tenderness.  Skin: Skin is warm and dry. No rash noted. No erythema.     Lab Results Recent Labs    04/01/23 0109 04/02/23 0054 04/02/23 0855  WBC  --   --  17.6*  HGB  --   --  7.7*  HCT  --   --  24.8*  NA 129* 130*  --   K 3.5 4.7  --   CL 91* 89*  --   CO2 23 22  --   BUN 41* 60*  --   CREATININE 5.13* 6.33*  --    Liver Panel Recent Labs    04/01/23 0109 04/02/23 0054  ALBUMIN 2.6* 2.7*   Sedimentation Rate No results for  input(s): "ESRSEDRATE" in the last 72 hours. C-Reactive Protein No results for input(s): "CRP" in the last 72 hours.  Microbiology:  Studies/Results: No results found.   Assessment/Plan: MRSE thoracolumbar discitis = will switch from dapto to vancomycin with HD to finish out course then convert to oral suppression  Acute hypoxic Respiratory distress= likely due to accidental opiate overdose. Reportedly improving with bipap  ESRD on HD = to have HD today, hopefully will help mentation.  No longer having diarrhea = would discontinue enteric precautions  I have personally spent 50 minutes involved in face-to-face and non-face-to-face activities for this patient on the day of the visit. Professional time spent includes the following activities: Preparing to see the patient (review of tests), Obtaining and/or reviewing separately obtained history (admission/discharge record), Performing a medically appropriate examination and/or evaluation , Ordering medications/tests/procedures, referring and communicating with other health care professionals, Documenting clinical information in the EMR, Independently interpreting results (not separately reported), Communicating results to the patient/family/caregiver, Counseling and educating the patient/family/caregiver and Care coordination (not separately reported).      [facility] Pager: [phone]  04/02/2023, 1:48 PM

## 2023-04-02 NOTE — Progress Notes (Signed)
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 -TDC placement 03/23/2023 - CLIP to AF MWF 1145 (arrive 1115am 1st day) - BCF by Dr. Chestine Spore on 5/15 -> positive thrill - Continue MWF schedule.  I still have some concerns about her suitability for OP HD- her mobility seems not good and now confusion-  will need to see if can sit up in chair for HD. -Now moved to ICU for AMS.  Plan to do HD while in ICU.  -Hopefully she can tolerate some UF with HD and it will help clear some of the medication which might have contributed to her encephalopathy.  # Anemia: Continue ESA  Required blood transfusion.Iron was OK.   Hgb low but stable.   # CKD-MBD/secondary hyperparathyroidism: Continue calcitriol and monitor lab.   # HTN/volume:   Continue midodrine pre-HD for intradialytic hypotension.  Manage volume with HD. I think now close to EDW although cont hyponatremia would argue against.  UF as able.  Monitor BP.   # Osteomyelitis plan on daptomycin.  Last dose on 6/14.   # Hyponatremia, hypervolemic: Managed with dialysis/   fluid restriction.   # Acute febrile illness: Chest x-ray negative.  On antibiotics for OM.  Blood culture negative so far.  Catheter site clean.  Afebrile now.  # Hypokalemia:  managed with potassium bath.  # Acute metabolic/toxic encephalopathy: Received Narcan and was moved to ICU.  Patient is still remains somnolent and not really following commands.  Recommend minimizing sedation.  Hopefully dialysis will help clear some of the medication.  Discussed with ICU team.  Subjective: Seen and examined in ICU.  Patient is somnolent and not following commands.  Difficult to awaken her.  BP around 112/67 this morning.   Objective Vital signs in last 24 hours: Vitals:   04/02/23 0645 04/02/23 0700 04/02/23 0800 04/02/23 0813  BP: (!) 106/47 (!) 114/52 116/61 124/65  Pulse: 73 72 73 74  Resp: (!) 9 (!) 9 15 (!) 8   Temp:    (!) 96.8 F (36 C)  TempSrc:    Axillary  SpO2: 98% 99% 99% 91%  Weight:      Height:       Weight change: -0.1 kg  Intake/Output Summary (Last 24 hours) at 04/02/2023 0906 Last data filed at 04/02/2023 0600 Gross per 24 hour  Intake 98.48 ml  Output --  Net 98.48 ml       Labs: RENAL PANEL Recent Labs  Lab 03/29/23 1552 03/30/23 0305 03/30/23 0834 03/31/23 0056 04/01/23 0109 04/02/23 0054  NA 129* 128*  --  129* 129* 130*  K 3.6 4.0  --  3.5 3.5 4.7  CL 90* 90*  --  90* 91* 89*  CO2 26 23  --  23 23 22   GLUCOSE 75 90  --  141* 133* 140*  BUN 30* 36*  --  23 41* 60*  CREATININE 4.36* 5.15*  --  3.63* 5.13* 6.33*  CALCIUM 8.6* 8.3*  --  8.2* 8.4* 8.4*  PHOS 4.1 4.5  --  2.3* 3.6 5.3*  ALBUMIN 3.4* 3.3* 3.2* 2.9* 2.6* 2.7*    Liver Function Tests: Recent Labs  Lab 03/30/23 0834 03/31/23 0056 04/01/23 0109 04/02/23 0054  AST 24  --   --   --   ALT 18  --   --   --   ALKPHOS 121  --   --   --   BILITOT  2.1*  --   --   --   PROT 7.2  --   --   --   ALBUMIN 3.2* 2.9* 2.6* 2.7*   No results for input(s): "LIPASE", "AMYLASE" in the last 168 hours. No results for input(s): "AMMONIA" in the last 168 hours. CBC: Recent Labs    07/26/22 1258 08/09/22 1343 09/06/22 1325 09/06/22 1326 09/07/22 1433 10/10/22 1308 11/08/22 1243 11/15/22 1334 12/06/22 1326 12/06/22 1327 12/13/22 1424 01/12/23 1419 01/28/23 1539 03/02/23 0402 03/02/23 1558 03/25/23 0321 03/26/23 0950 03/26/23 1620 03/28/23 0314 03/29/23 1552  HGB 8.0*   < > 10.0*  --  10.5*   < > 9.9*   < > 8.9*  --    < > 8.3*   < > 7.6*   < > 7.9* 7.7* 8.4* 7.8* 7.6*  MCV 90.7   < > 88.7  --  86.7   < > 91.2   < > 95.5  --    < > 93.3   < > 91.5   < > 88.4 89.1 91.3 89.8 90.3  VITAMINB12  --   --   --   --  273  --   --   --   --   --   --   --   --   --   --   --   --   --   --   --   FERRITIN 394*  --   --  401*  --   --  258  --   --  209  --  277  --   --   --   --   --   --   --   --    TIBC 232*  --  258  --   --   --  256  --  269  --   --  263  --  217*  --   --   --   --   --   --   IRON 64  --  69  --   --   --  53  --  46  --   --  32  --  70  --   --   --   --   --   --   RETICCTPCT 2.2  --   --  1.4  --   --  2.1  --   --  2.1  --  2.6  --   --   --   --   --   --   --   --    < > = values in this interval not displayed.    Cardiac Enzymes: Recent Labs  Lab 03/27/23 0400  CKTOTAL 70   CBG: Recent Labs  Lab 04/01/23 1707 04/01/23 1949 04/01/23 2338 04/02/23 0330 04/02/23 0809  GLUCAP 78 99 124* 144* 152*    Iron Studies: No results for input(s): "IRON", "TIBC", "TRANSFERRIN", "FERRITIN" in the last 72 hours. Studies/Results: No results found.  Medications: Infusions:  sodium chloride 10 mL/hr at 04/02/23 0600   sodium chloride     albumin human     DAPTOmycin (CUBICIN) 650 mg in sodium chloride 0.9 % IVPB     norepinephrine (LEVOPHED) Adult infusion Stopped (04/01/23 2201)    Scheduled Medications:  acetaminophen  1,000 mg Oral TID   acidophilus  2 capsule Oral TID   apixaban  2.5 mg Oral BID   calcitRIOL  0.5  mcg Oral Daily   calcium carbonate  1 tablet Oral Daily   Chlorhexidine Gluconate Cloth  6 each Topical Q0600   darbepoetin (ARANESP) injection - NON-DIALYSIS  100 mcg Subcutaneous Q Fri-1800   diclofenac  1 patch Transdermal BID   DULoxetine  60 mg Oral Daily   feeding supplement (NEPRO CARB STEADY)  237 mL Oral TID BM   loratadine  10 mg Oral Daily   methocarbamol  500 mg Oral TID   midodrine  10 mg Oral Q8H   mometasone-formoterol  2 puff Inhalation BID   multivitamin  1 tablet Oral QHS   polyethylene glycol  17 g Oral BID   pregabalin  50 mg Oral Daily   senna-docusate  1 tablet Oral BID   sodium chloride flush  10-40 mL Intracatheter Q12H   sodium chloride flush  3 mL Intravenous Q12H    have reviewed scheduled and prn medications.  Physical Exam: General: Somnolent female, lying in bed Heart:RRR, s1s2 nl Lungs:clear  b/l, no crackle Abdomen:soft,  non-distended Extremities:No edema Dialysis Access: Left IJ in place, L BCF + T/B   [name] [name] [name] [name] 04/02/2023,9:06 AM  LOS: 34 days

## 2023-04-03 DIAGNOSIS — G9340 Encephalopathy, unspecified: Secondary | ICD-10-CM | POA: Diagnosis not present

## 2023-04-03 DIAGNOSIS — E440 Moderate protein-calorie malnutrition: Secondary | ICD-10-CM | POA: Insufficient documentation

## 2023-04-03 DIAGNOSIS — M4624 Osteomyelitis of vertebra, thoracic region: Secondary | ICD-10-CM | POA: Diagnosis not present

## 2023-04-03 LAB — RENAL FUNCTION PANEL
Albumin: 3 g/dL — ABNORMAL LOW (ref 3.5–5.0)
Albumin: 2.7 g/dL — ABNORMAL LOW (ref 3.5–5.0)
Anion gap: 20 — ABNORMAL HIGH (ref 5–15)
Anion gap: 20 — ABNORMAL HIGH (ref 5–15)
BUN: 43 mg/dL — ABNORMAL HIGH (ref 8–23)
BUN: 55 mg/dL — ABNORMAL HIGH (ref 8–23)
CO2: 22 mmol/L (ref 22–32)
CO2: 18 mmol/L — ABNORMAL LOW (ref 22–32)
Calcium: 8.6 mg/dL — ABNORMAL LOW (ref 8.9–10.3)
Calcium: 8.5 mg/dL — ABNORMAL LOW (ref 8.9–10.3)
Chloride: 93 mmol/L — ABNORMAL LOW (ref 98–111)
Chloride: 95 mmol/L — ABNORMAL LOW (ref 98–111)
Creatinine, Ser: 4.09 mg/dL — ABNORMAL HIGH (ref 0.44–1.00)
Creatinine, Ser: 4.02 mg/dL — ABNORMAL HIGH (ref 0.44–1.00)
GFR, Estimated: 11 mL/min — ABNORMAL LOW (ref >60)
GFR, Estimated: 12 mL/min — ABNORMAL LOW (ref >60)
Glucose, Bld: 131 mg/dL — ABNORMAL HIGH (ref 70–99)
Glucose, Bld: 132 mg/dL — ABNORMAL HIGH (ref 70–99)
Phosphorus: 3.8 mg/dL (ref 2.5–4.6)
Phosphorus: 3.1 mg/dL (ref 2.5–4.6)
Potassium: 3.8 mmol/L (ref 3.5–5.1)
Potassium: 4.1 mmol/L (ref 3.5–5.1)
Sodium: 135 mmol/L (ref 135–145)
Sodium: 133 mmol/L — ABNORMAL LOW (ref 135–145)

## 2023-04-03 LAB — GLUCOSE, CAPILLARY
Glucose-Capillary: 126 mg/dL — ABNORMAL HIGH (ref 70–99)
Glucose-Capillary: 126 mg/dL — ABNORMAL HIGH (ref 70–99)

## 2023-04-03 LAB — POCT I-STAT 7, (LYTES, BLD GAS, ICA,H+H)
Acid-base deficit: 3 mmol/L — ABNORMAL HIGH (ref 0.0–2.0)
Bicarbonate: 21.8 mmol/L (ref 20.0–28.0)
Calcium, Ion: 1.15 mmol/L (ref 1.15–1.40)
HCT: 25 % — ABNORMAL LOW (ref 36.0–46.0)
Hemoglobin: 8.5 g/dL — ABNORMAL LOW (ref 12.0–15.0)
O2 Saturation: 95 %
Patient temperature: 37.3
Potassium: 3.9 mmol/L (ref 3.5–5.1)
Sodium: 134 mmol/L — ABNORMAL LOW (ref 135–145)
TCO2: 23 mmol/L (ref 22–32)
pCO2 arterial: 36.7 mmHg (ref 32–48)
pH, Arterial: 7.384 (ref 7.35–7.45)
pO2, Arterial: 77 mmHg — ABNORMAL LOW (ref 83–108)

## 2023-04-03 LAB — CBC
HCT: 24.8 % — ABNORMAL LOW (ref 36.0–46.0)
Hemoglobin: 7.6 g/dL — ABNORMAL LOW (ref 12.0–15.0)
MCH: 28.4 pg (ref 26.0–34.0)
MCHC: 30.6 g/dL (ref 30.0–36.0)
MCV: 92.5 fL (ref 80.0–100.0)
Platelets: 209 10*3/uL (ref 150–400)
RBC: 2.68 MIL/uL — ABNORMAL LOW (ref 3.87–5.11)
RDW: 17.5 % — ABNORMAL HIGH (ref 11.5–15.5)
WBC: 14.8 10*3/uL — ABNORMAL HIGH (ref 4.0–10.5)
nRBC: 0.2 % (ref 0.0–0.2)

## 2023-04-03 MED ORDER — HEPARIN SODIUM (PORCINE) 1000 UNIT/ML DIALYSIS
1000.0000 [IU] | INTRAMUSCULAR | Status: DC | PRN
  Administered 2023-04-05: 4000 [IU] via INTRAVENOUS_CENTRAL
  Filled 2023-04-03: qty 3
  Filled 2023-04-03: qty 6

## 2023-04-03 MED ORDER — ENSURE ENLIVE PO LIQD
237.0000 mL | Freq: Three times a day (TID) | ORAL | Status: DC
  Administered 2023-04-03: 237 mL via ORAL

## 2023-04-03 MED ORDER — PRISMASOL BGK 4/2.5 32-4-2.5 MEQ/L REPLACEMENT SOLN
Status: DC
  Filled 2023-04-03 (×6): qty 5000

## 2023-04-03 MED ORDER — VANCOMYCIN HCL IN DEXTROSE 1-5 GM/200ML-% IV SOLN
1000.0000 mg | INTRAVENOUS | Status: DC
  Administered 2023-04-04 – 2023-04-05 (×2): 1000 mg via INTRAVENOUS
  Filled 2023-04-03 (×2): qty 200

## 2023-04-03 MED ORDER — VANCOMYCIN HCL IN DEXTROSE 1-5 GM/200ML-% IV SOLN: 1000.0000 mg | INTRAVENOUS | Status: DC

## 2023-04-03 MED ORDER — PRISMASOL BGK 4/2.5 32-4-2.5 MEQ/L EC SOLN
Status: DC
  Filled 2023-04-03 (×22): qty 5000

## 2023-04-03 NOTE — Progress Notes (Signed)
Physical Therapy Treatment Patient Details Name: [name] [name] MRN: [id number] DOB: 03-23-1956 Today's Date: 04/03/2023   History of Present Illness Pt is 67 year old who is moslty wheelchair bound admitted to the hospital 4/29 for worsening back pain for [redacted] week along with an episode of vomiting.  Pt found to have AKI requiring 2 cycles of HD.  Also, with ongoing osteomyelitis/discitis Thoracic spine with T4-5 disc aspiration by IR on 5/3.  Neurosurgery recommends further medical management, no surgical intervention at this time.  Pt transferred to ICU due to hypotension and suspected opioid OD 6/2. PMH:  CKD stage V, hypertension, cirrhosis of the liver, OSA, morbid obesity, L1 osteomyelitis status post L1 corpectomy, T10-T11 discitis osteomyelitis status post posterior arthrodesis of T8-T11 and removal of T11 pedicle screws and rod in 12/23.  Last month, diagnosed with T4-T5 discitis osteomyelitis which was being treated with daptomycin.  Neurosurgery did not recommend any further interventions.    PT Comments    Pt admitted with above diagnosis. Pt met 0/5 goals due to medical complications with transfer to ICU.  Goals revised today.  Pt able to sit EOB with +2 mod to total assist today.  Pt much weaker and intermittent confusion as well.  Will benefit from therapy post acute < 2hours. Will continues to follow acutely.  Pt currently with functional limitations due to balance and endurance deficits. Pt will benefit from acute skilled PT to increase their independence and safety with mobility to allow discharge.      Recommendations for follow up therapy are one component of a multi-disciplinary discharge planning process, led by the attending physician.  Recommendations may be updated based on patient status, additional functional criteria and insurance authorization.  Follow Up Recommendations       Assistance Recommended at Discharge Frequent or constant Supervision/Assistance  Patient can  return home with the following Assistance with cooking/housework;Help with stairs or ramp for entrance;A lot of help with walking and/or transfers;A lot of help with bathing/dressing/bathroom   Equipment Recommendations  Other (comment) (TBA)    Recommendations for Other Services       Precautions / Restrictions Precautions Precautions: Back Precaution Booklet Issued: No Precaution Comments: verbally reviewed precautions Required Braces or Orthoses: Spinal Brace Spinal Brace: Thoracolumbosacral orthotic Spinal Brace Comments: No orders but pt had from prior admission and wears when OOB Restrictions Weight Bearing Restrictions: No     Mobility  Bed Mobility Overal bed mobility: Needs Assistance Bed Mobility: Rolling, Sidelying to Sit Rolling: Max assist, +2 for physical assistance Sidelying to sit: Max assist, +2 for physical assistance     Sit to sidelying: Max assist, +2 for physical assistance General bed mobility comments: Cues for logroll technique. Max A for trunk elevation. Pt able to scoot forward at EOB with increased time and assist.  could not let go of pt as she had significant posterior lean.    Transfers                   General transfer comment: unable    Ambulation/Gait               General Gait Details: unable to progress due to pain.   Stairs             Wheelchair Mobility    Modified Rankin (Stroke Patients Only)       Balance Overall balance assessment: Needs assistance Sitting-balance support: Bilateral upper extremity supported, Feet unsupported (due to pt height on ICU bed) Sitting balance-Leahy  Scale: Zero Sitting balance - Comments: sitting EOB for up to 8 min with mod to total assist most of time.  Pt leans posteriorly and constantly wanting and trying to lie back down.  Pt did have one period of min assist for 3 seconds when PT tried to get pt to put hands on knees but pt difficult to redirect and maintain  attention and sustand upright posture.                                    Cognition Arousal/Alertness: Awake/alert Behavior During Therapy: WFL for tasks assessed/performed Overall Cognitive Status: Within Functional Limits for tasks assessed Area of Impairment: Safety/judgement, Problem solving                   Current Attention Level: Selective     Safety/Judgement: Decreased awareness of safety   Problem Solving: Slow processing, Difficulty sequencing General Comments: Pt perseverating at times.  Pt stating "hold on" after each command. Pt mumbling and difficult to understand at times.        Exercises General Exercises - Lower Extremity Ankle Circles/Pumps: AROM, Both, 15 reps, Seated Quad Sets: AROM, Supine, Both, 5 reps Heel Slides: Supine, Both, 10 reps, AAROM    General Comments General comments (skin integrity, edema, etc.): Continues with confusion.  VSS on 8L.      Pertinent Vitals/Pain Pain Assessment Pain Assessment: Faces Faces Pain Scale: Hurts whole lot Pain Location: back Pain Descriptors / Indicators: Grimacing, Guarding Pain Intervention(s): Limited activity within patient's tolerance, Monitored during session, Repositioned    Home Living                          Prior Function            PT Goals (current goals can now be found in the care plan section) Acute Rehab PT Goals Patient Stated Goal: return home PT Goal Formulation: With patient Time For Goal Achievement: 04/17/23 Potential to Achieve Goals: Fair Progress towards PT goals: Goals downgraded-see care plan    Frequency    Min 3X/week      PT Plan Discharge plan needs to be updated    Co-evaluation              AM-PAC PT "6 Clicks" Mobility   Outcome Measure  Help needed turning from your back to your side while in a flat bed without using bedrails?: A Lot Help needed moving from lying on your back to sitting on the side of a flat  bed without using bedrails?: Total Help needed moving to and from a bed to a chair (including a wheelchair)?: Total Help needed standing up from a chair using your arms (e.g., wheelchair or bedside chair)?: Total Help needed to walk in hospital room?: Total Help needed climbing 3-5 steps with a railing? : Total 6 Click Score: 7    End of Session Equipment Utilized During Treatment: Gait belt;Oxygen (8LO2) Activity Tolerance: Patient limited by fatigue;Patient limited by pain Patient left: with call bell/phone within reach;in chair;with chair alarm set Nurse Communication: Mobility status;Need for lift equipment PT Visit Diagnosis: Other abnormalities of gait and mobility (R26.89);Muscle weakness (generalized) (M62.81);Pain Pain - part of body:  (back)     Time: 0940-1005 PT Time Calculation (min) (ACUTE ONLY): 25 min  Charges:  $Therapeutic Exercise: 8-22 mins $Therapeutic Activity: 8-22 mins  [facility] M,PT Acute Rehab Services [phone]    [name] [name] 04/03/2023, 11:55 AM
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
## 2023-04-03 NOTE — Progress Notes (Signed)
Pharmacy Antibiotic Note  [name] [name] is a 67 y.o. adult admitted on 01/30/2023 with MRSE discitis.  Pharmacy has been consulted to transition Daptomycin to Vancomycin in the setting of new ESRD.   The patient had an HD session in her room 6/3 afternoon and was transitioned from daptomycin to vancomycin. Vancomycin 2g IV x1 was given 6/3 @1800 . Patient transitioned to CRRT today (6/4) @ noon. Based on calculation, patient will be due for another dose of vancomycin 18-20 hours after CRRT initiation.   Plan: - Vancomycin 1g IV q24hrs starting 6/5 @0730  - Vancomycin random levels as needed  - Will continue to follow CRRT plan, culture results, LOT, and plans for antibiotic de-escalation  Height: 4\' 11"  (149.9 cm) Weight: 78.2 kg (172 lb 6.4 oz) IBW/kg (Calculated) : 43.2  Temp (24hrs), Avg:98.6 F (37 C), Min:98 F (36.7 C), Max:99.3 F (37.4 C)  Recent Labs  Lab 03/28/23 0314 03/29/23 0337 03/29/23 1552 03/30/23 0305 03/31/23 0056 04/01/23 0109 04/02/23 0054 04/02/23 0855 04/03/23 0039  WBC 8.6  --  6.8  --   --   --   --  18.2*  17.6* 14.8*  CREATININE 4.72*   < > 4.36* 5.15* 3.63* 5.13* 6.33*  --  4.09*   < > = values in this interval not displayed.     Estimated Creatinine Clearance (by C-G formula based on SCr of 4.09 mg/dL (H)) Female: 81.1 mL/min (A) Female: 14.8 mL/min (A)    Allergies  Allergen Reactions   Bee Pollen Anaphylaxis   Broccoli [Brassica Oleracea] Anaphylaxis and Hives   Fish-Derived Products Anaphylaxis and Hives   Mushroom Extract Complex Anaphylaxis   Penicillins Anaphylaxis    **Tolerated cefepime March 2021 Did it involve swelling of the face/tongue/throat, SOB, or low BP? Yes Did it involve sudden or severe rash/hives, skin peeling, or any reaction on the inside of your mouth or nose? No Did you need to seek medical attention at a hospital or doctor's office? Yes When did it last happen?  A few months ago If all above answers are "NO",  may proceed with cephalosporin use.   Rosemary Oil Anaphylaxis   Shellfish Allergy Anaphylaxis and Hives   Tomato Anaphylaxis and Hives    Raw tomatoes only, can tolerate if cooked   Acetaminophen Other (See Comments)    GI upset   Aloe Vera Hives   Doxycycline Diarrhea   Acyclovir And Related Other (See Comments)    Unknown reaction   Naproxen Hives    Thank you for allowing pharmacy to be a part of this patient's care.  Cherylin Mylar, PharmD PGY1 Pharmacy Resident 6/4/20242:46 PM

## 2023-04-03 NOTE — Plan of Care (Signed)
  Problem: Education: Goal: Understanding of CV disease, CV risk reduction, and recovery process will improve Outcome: Not Progressing Goal: Individualized Educational Video(s) Outcome: Not Progressing   Problem: Activity: Goal: Ability to return to baseline activity level will improve Outcome: Not Progressing   Problem: Cardiovascular: Goal: Ability to achieve and maintain adequate cardiovascular perfusion will improve Outcome: Not Progressing Goal: Vascular access site(s) Level 0-1 will be maintained Outcome: Not Progressing   Problem: Health Behavior/Discharge Planning: Goal: Ability to safely manage health-related needs after discharge will improve Outcome: Not Progressing   Problem: Education: Goal: Ability to describe self-care measures that may prevent or decrease complications (Diabetes Survival Skills Education) will improve Outcome: Not Progressing Goal: Individualized Educational Video(s) Outcome: Not Progressing   Problem: Coping: Goal: Ability to adjust to condition or change in health will improve Outcome: Not Progressing   Problem: Fluid Volume: Goal: Ability to maintain a balanced intake and output will improve Outcome: Not Progressing   Problem: Health Behavior/Discharge Planning: Goal: Ability to identify and utilize available resources and services will improve Outcome: Not Progressing Goal: Ability to manage health-related needs will improve Outcome: Not Progressing   Problem: Metabolic: Goal: Ability to maintain appropriate glucose levels will improve Outcome: Not Progressing   Problem: Nutritional: Goal: Maintenance of adequate nutrition will improve Outcome: Not Progressing Goal: Progress toward achieving an optimal weight will improve Outcome: Not Progressing   Problem: Skin Integrity: Goal: Risk for impaired skin integrity will decrease Outcome: Not Progressing   Problem: Tissue Perfusion: Goal: Adequacy of tissue perfusion will  improve Outcome: Not Progressing   Problem: Education: Goal: Knowledge of General Education information will improve Description: Including pain rating scale, medication(s)/side effects and non-pharmacologic comfort measures Outcome: Not Progressing   Problem: Health Behavior/Discharge Planning: Goal: Ability to manage health-related needs will improve Outcome: Not Progressing   Problem: Clinical Measurements: Goal: Ability to maintain clinical measurements within normal limits will improve Outcome: Not Progressing Goal: Will remain free from infection Outcome: Not Progressing Goal: Diagnostic test results will improve Outcome: Not Progressing Goal: Respiratory complications will improve Outcome: Not Progressing Goal: Cardiovascular complication will be avoided Outcome: Not Progressing   Problem: Activity: Goal: Risk for activity intolerance will decrease Outcome: Not Progressing   Problem: Nutrition: Goal: Adequate nutrition will be maintained Outcome: Not Progressing   Problem: Coping: Goal: Level of anxiety will decrease Outcome: Not Progressing   Problem: Elimination: Goal: Will not experience complications related to bowel motility Outcome: Not Progressing   Problem: Pain Managment: Goal: General experience of comfort will improve Outcome: Not Progressing   Problem: Safety: Goal: Ability to remain free from injury will improve Outcome: Not Progressing   Problem: Skin Integrity: Goal: Risk for impaired skin integrity will decrease Outcome: Not Progressing   Problem: Education: Goal: Knowledge of disease and its progression will improve Outcome: Not Progressing   Problem: Fluid Volume: Goal: Compliance with measures to maintain balanced fluid volume will improve Outcome: Not Progressing   Problem: Health Behavior/Discharge Planning: Goal: Ability to manage health-related needs will improve Outcome: Not Progressing   Problem: Nutritional: Goal:  Ability to make healthy dietary choices will improve Outcome: Not Progressing   Problem: Clinical Measurements: Goal: Complications related to the disease process, condition or treatment will be avoided or minimized Outcome: Not Progressing

## 2023-04-03 NOTE — Progress Notes (Signed)
Nutrition Follow-up  DOCUMENTATION CODES:   Non-severe (moderate) malnutrition in context of acute illness/injury  INTERVENTION:   If PO intake remains poor and mental status does not improve, recommend placement of Cortrak tube tomorrow (04/04/23) and initiation of enteral nutrition. Recommend: - Start Pivot 1.5 @ 20 ml/hr and advance rate by 10 ml q 8 hours to goal rate of 50 ml/hr (1200 ml/day)  Recommended tube feeding regimen at goal rate would provide 1800 kcal, 112.5 grams of protein, and 900 ml of H2O.   - Recommend REGULAR diet to promote PO intake  - Ensure Enlive po TID, each supplement provides 350 kcal and 20 grams of protein  - Continue renal MVI daily  NUTRITION DIAGNOSIS:   Moderate Malnutrition related to acute illness (acute osteomyelitis) as evidenced by mild fat depletion, moderate muscle depletion.  New diagnosis after completion of NFPE  GOAL:   Patient will meet greater than or equal to 90% of their needs  Unmet at this time  MONITOR:   PO intake, Supplement acceptance, Labs, Weight trends, I & O's, Skin  REASON FOR ASSESSMENT:   Consult Assessment of nutrition requirement/status (new CRRT)  ASSESSMENT:   67 y.o. female admits related to abdominal pain, back pain, nausea, and vomiting. PMH includes: acute MI, anemia, CAD, cirrhosis, DM, HTN, HLD, MRSA. Pt is currently receiving medical management related to acute osteomyelitis of thoracic spine.  05/03 - s/p disc aspiration 05/13 - new ESRD s/p [facility] 05/15 - fistula placement 05/31 - removal of [facility] 06/03 - transfer to ICU, NPO 06/04 - CRRT start, diet advanced to regular  Discussed pt with RN and during ICU rounds. Per RN, pt taking PO pills slowly but without difficulty. MD okay with pt having a diet order in place and is agreeable to regular diet order to promote PO intake. RD will also order oral nutrition supplements.  Spoke with pt at bedside. She is not very interactive at time of visit.  RN reports pt is not able to sit up for long periods of time. Suspect that PO intake will remain poor. Pt has not had good PO intake for at least the last week and likely longer. Meal completions have ranged from 0-50%.  RN student reports pt drank a few sips of Nepro but has been drinking a lot of water.  Discussed recommendation for Cortrak during rounds. Per MD, plan to see how pt eats today and reevaluate plan for Cortrak tomorrow.  Pt with mild pitting generalized edema, mild pitting edema to BUE, and mild pitting edema to BLE.  Meal Completion: - 5/29: 0%, 50% - 5/30: 25% - 6/01: 50% - 6/02: no documentation  Medications reviewed and include: risaquad, calcitriol, tums, aranesp, rena-vit, miralax, senna, IV abx  Labs reviewed: chloride 93, BUN 43, creatinine 4.09, WBC 14.8, hemoglobin 7.6 CBG's: 79-145 x 24 hours  I/O's: -19.8 L since admit  NUTRITION - FOCUSED PHYSICAL EXAM:  Flowsheet Row Most Recent Value  Orbital Region Mild depletion  Upper Arm Region No depletion  Thoracic and Lumbar Region No depletion  Buccal Region Mild depletion  Temple Region Mild depletion  Clavicle Bone Region Moderate depletion  Clavicle and Acromion Bone Region Moderate depletion  Scapular Bone Region Unable to assess  Dorsal Hand Mild depletion  Patellar Region Moderate depletion  Anterior Thigh Region Moderate depletion  Posterior Calf Region Moderate depletion  Edema (RD Assessment) Mild  Hair Reviewed  Eyes Reviewed  Mouth Reviewed  Skin Reviewed  Nails Reviewed  Diet Order:   Diet Order             Diet regular Room service appropriate? No; Fluid consistency: Thin  Diet effective now                   EDUCATION NEEDS:   Not appropriate for education at this time  Skin:  Skin Assessment: Skin Integrity Issues: Stage II: bilateral sacrum Incisions: L arm Other: pressure injury to L heel  Last BM:  03/28/23  Height:   Ht Readings from Last 1  Encounters:  02/17/2023 4\' 11"  (1.499 m)  06/21/22 5\' 1"  (1.549 m) 12/19/21 5\' 5"  (1.651 m)  Weight:   Wt Readings from Last 1 Encounters:  04/03/23 78.2 kg    Ideal Body Weight:  50 kg (based on average height from heights listed above)  BMI:  Body mass index is 34.82 kg/m.  Estimated Nutritional Needs:   Kcal:  1750-1950  Protein:  100-115 grams  Fluid:  1.7-1.9 L    Mertie Clause, MS, RD, LDN Inpatient Clinical Dietitian Please see AMiON for contact information.

## 2023-04-03 NOTE — Progress Notes (Signed)
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 -TDC placement 03/03/2023 - CLIP to AF MWF 1145 (arrive 1115am 1st day) - BCF by Dr. Chestine Spore on 5/15 -> positive thrill -She has been receiving dialysis MWF schedule, last IHD on 6/3 with 2 L ultrafiltration.  The HD was complicated by intradialytic hypotension requiring albumin, lower temperature etc.  UF was around 2 L.  Noted she has respiratory distress, fluid overload and blood pressures are.  I think she will benefit from CRRT to optimize volume etc.  Discussed with ICU team.  We will start CRRT today, all 4K bath.  # Anemia: Continue ESA  Required blood transfusion.Iron was OK.  Monitor and transfuse as needed.   # CKD-MBD/secondary hyperparathyroidism: Continue calcitriol and monitor lab.   # HTN/volume: Blood pressure is soft, requiring midodrine for intradialytic hypotension.  Given significant fluid overload we are restarting CRRT.   # Osteomyelitis plan on daptomycin.  Last dose on 6/14.   # Hyponatremia, hypervolemic: Managed with dialysis/   fluid restriction.   # Acute febrile illness: Chest x-ray negative.  On antibiotics for OM.  Blood culture negative so far.  Catheter site clean.  Afebrile now.  # Hypokalemia:  managed with dialysis.  # Acute metabolic/toxic encephalopathy: Received Narcan and was moved to ICU on 6/3.  She opens eyes but is still somnolent.  Subjective: Seen and examined in ICU.  Opening eyes but is still somnolent and not following commands.  Had HD yesterday.  Discussed with dialysis nurse and ICU teams.  Objective Vital signs in last 24 hours: Vitals:   04/03/23 0800 04/03/23 0810 04/03/23 0815 04/03/23 0830  BP: (!) 131/47 (!) 144/46 (!) 144/46 (!) 142/41  Pulse: (!) 103 (!) 106 (!) 108 (!) 108  Resp: 16 20 (!) 21 (!) 26  Temp:      TempSrc:      SpO2: 94% 97% 93% 95%  Weight:      Height:       Weight change: 0.1  kg  Intake/Output Summary (Last 24 hours) at 04/03/2023 1032 Last data filed at 04/03/2023 0800 Gross per 24 hour  Intake 859.4 ml  Output --  Net 859.4 ml        Labs: RENAL PANEL Recent Labs  Lab 03/30/23 0305 03/30/23 0834 03/31/23 0056 04/01/23 0109 04/02/23 0054 04/03/23 0039  NA 128*  --  129* 129* 130* 135  K 4.0  --  3.5 3.5 4.7 3.8  CL 90*  --  90* 91* 89* 93*  CO2 23  --  23 23 22 22   GLUCOSE 90  --  141* 133* 140* 131*  BUN 36*  --  23 41* 60* 43*  CREATININE 5.15*  --  3.63* 5.13* 6.33* 4.09*  CALCIUM 8.3*  --  8.2* 8.4* 8.4* 8.6*  PHOS 4.5  --  2.3* 3.6 5.3* 3.8  ALBUMIN 3.3* 3.2* 2.9* 2.6* 2.7* 3.0*     Liver Function Tests: Recent Labs  Lab 03/30/23 0834 03/31/23 0056 04/01/23 0109 04/02/23 0054 04/03/23 0039  AST 24  --   --   --   --   ALT 18  --   --   --   --   ALKPHOS 121  --   --   --   --   BILITOT 2.1*  --   --   --   --   PROT 7.2  --   --   --   --  ALBUMIN 3.2*   < > 2.6* 2.7* 3.0*   < > = values in this interval not displayed.    No results for input(s): "LIPASE", "AMYLASE" in the last 168 hours. No results for input(s): "AMMONIA" in the last 168 hours. CBC: Recent Labs    07/26/22 1258 08/09/22 1343 09/06/22 1325 09/06/22 1326 09/07/22 1433 10/10/22 1308 11/08/22 1243 11/15/22 1334 12/06/22 1326 12/06/22 1327 12/13/22 1424 01/12/23 1419 01/28/23 1539 03/02/23 0402 03/02/23 1558 03/26/23 1620 03/28/23 0314 03/29/23 1552 04/02/23 0855 04/03/23 0039  HGB 8.0*   < > 10.0*  --  10.5*   < > 9.9*   < > 8.9*  --    < > 8.3*   < > 7.6*   < > 8.4* 7.8* 7.6* 7.8*  7.7* 7.6*  MCV 90.7   < > 88.7  --  86.7   < > 91.2   < > 95.5  --    < > 93.3   < > 91.5   < > 91.3 89.8 90.3 91.8  89.9 92.5  VITAMINB12  --   --   --   --  273  --   --   --   --   --   --   --   --   --   --   --   --   --   --   --   FERRITIN 394*  --   --  401*  --   --  258  --   --  209  --  277  --   --   --   --   --   --   --   --   TIBC 232*  --   258  --   --   --  256  --  269  --   --  263  --  217*  --   --   --   --   --   --   IRON 64  --  69  --   --   --  53  --  46  --   --  32  --  70  --   --   --   --   --   --   RETICCTPCT 2.2  --   --  1.4  --   --  2.1  --   --  2.1  --  2.6  --   --   --   --   --   --   --   --    < > = values in this interval not displayed.     Cardiac Enzymes: No results for input(s): "CKTOTAL", "CKMB", "CKMBINDEX", "TROPONINI" in the last 168 hours.  CBG: Recent Labs  Lab 04/02/23 0809 04/02/23 1125 04/02/23 1502 04/02/23 2006 04/03/23 0314  GLUCAP 152* 145* 129* 79 126*     Iron Studies: No results for input(s): "IRON", "TIBC", "TRANSFERRIN", "FERRITIN" in the last 72 hours. Studies/Results: No results found.  Medications: Infusions:   prismasol BGK 4/2.5      prismasol BGK 4/2.5     sodium chloride 10 mL/hr at 04/03/23 0800   sodium chloride     norepinephrine (LEVOPHED) Adult infusion Stopped (04/03/23 0045)   prismasol BGK 4/2.5     [START ON 04/04/2023] vancomycin      Scheduled Medications:  acetaminophen  1,000 mg Oral TID   acidophilus  2 capsule Oral TID  apixaban  2.5 mg Oral BID   arformoterol  15 mcg Nebulization BID   budesonide (PULMICORT) nebulizer solution  0.25 mg Nebulization BID   calcitRIOL  0.5 mcg Oral Daily   calcium carbonate  1 tablet Oral Daily   Chlorhexidine Gluconate Cloth  6 each Topical Q0600   darbepoetin (ARANESP) injection - NON-DIALYSIS  100 mcg Subcutaneous Q Fri-1800   diclofenac  1 patch Transdermal BID   DULoxetine  60 mg Oral Daily   feeding supplement (NEPRO CARB STEADY)  237 mL Oral TID BM   loratadine  10 mg Oral Daily   methocarbamol  500 mg Oral TID   midodrine  10 mg Oral Q8H   multivitamin  1 tablet Oral QHS   mouth rinse  15 mL Mouth Rinse 4 times per day   polyethylene glycol  17 g Oral BID   pregabalin  50 mg Oral Daily   senna-docusate  1 tablet Oral BID   sodium chloride flush  10-40 mL Intracatheter Q12H   sodium  chloride flush  3 mL Intravenous Q12H    have reviewed scheduled and prn medications.  Physical Exam: General: Somnolent female, lying on bed, not following command. Heart:RRR, s1s2 nl Lungs: Coarse breath sound bilateral. Abdomen:soft,  non-distended Extremities: Trace LE edema present Dialysis Access: Left IJ in place, L BCF + T/B   Rangel Echeverri Prasad [place]ena [place] 04/03/2023,10:32 AM  LOS: 35 days

## 2023-04-03 NOTE — Progress Notes (Signed)
[facility]    Date of Admission:  02/04/2023              ID: [name] [name] is a 67 y.o. 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)   IDA (iron deficiency anemia)   PTSD (post-traumatic stress disorder)   Hypertension   High cholesterol   GERD (gastroesophageal reflux disease)   Fibromyalgia   Depression   Asthma   Anxiety   Anemia   Hypothyroidism   CKD (chronic kidney disease) stage 5, GFR less than 15 ml/min (HCC)   Osteomyelitis (HCC)   Chronic diastolic CHF (congestive heart failure) (HCC)   Thrombocytopenia (HCC)   ESRD on dialysis (HCC)   Fever   Chronic thoracic back pain   Acute encephalopathy   Malnutrition of moderate degree    Subjective: Afebrile. No longer on bipap. Denies pain. But delayed response in answering questions  Medications:   acetaminophen  1,000 mg Oral TID   acidophilus  2 capsule Oral TID   apixaban  2.5 mg Oral BID   arformoterol  15 mcg Nebulization BID   budesonide (PULMICORT) nebulizer solution  0.25 mg Nebulization BID   calcitRIOL  0.5 mcg Oral Daily   calcium carbonate  1 tablet Oral Daily   Chlorhexidine Gluconate Cloth  6 each Topical Q0600   darbepoetin (ARANESP) injection - NON-DIALYSIS  100 mcg Subcutaneous Q Fri-1800   diclofenac  1 patch Transdermal BID   DULoxetine  60 mg  Oral Daily   feeding supplement  237 mL Oral TID BM   loratadine  10 mg Oral Daily   methocarbamol  500 mg Oral TID   midodrine  10 mg Oral Q8H   multivitamin  1 tablet Oral QHS   mouth rinse  15 mL Mouth Rinse 4 times per day   polyethylene glycol  17 g Oral BID   pregabalin  50 mg Oral Daily   senna-docusate  1 tablet Oral BID   sodium chloride flush  10-40 mL Intracatheter Q12H   sodium chloride flush  3 mL Intravenous Q12H    Objective: Vital signs in last 24 hours: Temp:  [98 F (36.7 C)-99.3 F (37.4 C)] 99.1 F (37.3 C) (06/04 1512) Pulse Rate:  [98-124] 101 (06/04 1400) Resp:  [12-37] 14 (06/04 1400) BP: (78-158)/(36-92) 118/49 (06/04 1400) SpO2:  [90 %-100 %] 92 % (06/04 1400) FiO2 (%):  [40 %] 40 % (06/04 0409) Weight:  [78.2 kg] 78.2 kg (06/04 0600)  Physical Exam  Constitutional:  oriented to person,only. appears well-developed and well-nourished. No distress.  HENT: /AT, PERRLA, no scleral icterus Mouth/Throat: Oropharynx is clear and moist. No oropharyngeal exudate.  Cardiovascular: Normal rate, regular rhythm and normal heart sounds. Exam reveals no gallop and no friction rub.  No murmur heard.  Pulmonary/Chest: Effort normal and breath sounds normal.  No respiratory distress.  has no wheezes.  Neck = supple, no nuchal rigidity Abdominal: Soft. Bowel sounds are normal.  exhibits no distension. There is no tenderness.  Lymphadenopathy: no cervical adenopathy. No axillary adenopathy Neurological: alert and oriented to person, place, and time.  Skin: Skin is warm and dry. No rash noted. No erythema.  Psychiatric: a normal mood and affect.  behavior is normal.    Lab Results Recent Labs    04/02/23 0054 04/02/23 0855 04/03/23 0039  WBC  --  18.2*  17.6* 14.8*  HGB  --  7.8*  7.7* 7.6*  HCT  --  25.7*  24.8* 24.8*  NA 130*  --  135  K 4.7  --  3.8  CL 89*  --  93*  CO2 22  --  22  BUN 60*  --  43*  CREATININE 6.33*  --  4.09*   Liver  Panel Recent Labs    04/02/23 0054 04/03/23 0039  ALBUMIN 2.7* 3.0*   Sedimentation Rate No results for input(s): "ESRSEDRATE" in the last 72 hours. C-Reactive Protein No results for input(s): "CRP" in the last 72 hours.  Microbiology: Reviewed       Staphylococcus epidermidis      MIC    CIPROFLOXACIN >=8 RESISTANT Resistant    CLINDAMYCIN >=8 RESISTANT Resistant    ERYTHROMYCIN >=8 RESISTANT Resistant    GENTAMICIN <=0.5 SENSI... Sensitive    Inducible Clindamycin NEGATIVE Sensitive    OXACILLIN >=4 RESISTANT Resistant    RIFAMPIN <=0.5 SENSI... Sensitive    TETRACYCLINE 2 SENSITIVE Sensitive    TRIMETH/SULFA 80 RESISTANT Resistant    VANCOMYCIN 1 SENSITIVE Sensitive     Studies/Results: No results found.   Assessment/Plan: MRSE thoracolumbar OM = will finish out course with vancomycin through mid June. Continue to dose with HD.   Agree with plan to get palliative care involved for GOC.   Acute hypoxic respiratory failure = appears improved since now only bipap as needed. Continue to monitor.  [facility] for Infectious Diseases Pager: [phone]  04/03/2023, 3:49 PM

## 2023-04-03 NOTE — Progress Notes (Signed)
Daily Progress Note   Patient Name: [name] [name]       Date: 04/03/2023 DOB: 06-18-1956  Age: 67 y.o. MRN#: [id number] Attending Physician: Coralyn Helling, MD Primary Care Physician: Zola Button, Grayling Congress, DO Admit Date: 02/09/2023  Reason for Consultation/Follow-up: Establishing goals of care and Pain control  Subjective: Patient is awake in bed states she is doing ok. RN is at bedside.  Length of Stay: 35  Current Medications: Scheduled Meds:   acetaminophen  1,000 mg Oral TID   acidophilus  2 capsule Oral TID   apixaban  2.5 mg Oral BID   arformoterol  15 mcg Nebulization BID   budesonide (PULMICORT) nebulizer solution  0.25 mg Nebulization BID   calcitRIOL  0.5 mcg Oral Daily   calcium carbonate  1 tablet Oral Daily   Chlorhexidine Gluconate Cloth  6 each Topical Q0600   darbepoetin (ARANESP) injection - NON-DIALYSIS  100 mcg Subcutaneous Q Fri-1800   diclofenac  1 patch Transdermal BID   DULoxetine  60 mg Oral Daily   feeding supplement  237 mL Oral TID BM   loratadine  10 mg Oral Daily   methocarbamol  500 mg Oral TID   midodrine  10 mg Oral Q8H   multivitamin  1 tablet Oral QHS   mouth rinse  15 mL Mouth Rinse 4 times per day   polyethylene glycol  17 g Oral BID   pregabalin  50 mg Oral Daily   senna-docusate  1 tablet Oral BID   sodium chloride flush  10-40 mL Intracatheter Q12H   sodium chloride flush  3 mL Intravenous Q12H    Continuous Infusions:   prismasol BGK 4/2.5 400 mL/hr at 04/03/23 1129    prismasol BGK 4/2.5 400 mL/hr at 04/03/23 1132   sodium chloride 10 mL/hr at 04/03/23 1200   sodium chloride     norepinephrine (LEVOPHED) Adult infusion 2 mcg/min (04/03/23 1233)   prismasol BGK 4/2.5 1,500 mL/hr at 04/03/23 1135   [START ON 04/04/2023] vancomycin       PRN Meds: sodium chloride, albuterol, alteplase, heparin, heparin, hydrALAZINE, lidocaine (PF), lidocaine-prilocaine, naLOXone (NARCAN)  injection, nitroGLYCERIN, ondansetron (ZOFRAN) IV, mouth rinse, mouth rinse, oxyCODONE, pentafluoroprop-tetrafluoroeth  Physical Exam Constitutional:      Appearance: She is ill-appearing.  Pulmonary:     Comments: high flow  nasal cannula Skin:    General: Skin is warm and dry.            Vital Signs: BP 120/61   Pulse (!) 103   Temp 98 F (36.7 C) (Oral)   Resp 16   Ht 4\' 11"  (1.499 m)   Wt 78.2 kg   SpO2 95%   BMI 34.82 kg/m  SpO2: SpO2: 95 % O2 Device: O2 Device: Bi-PAP O2 Flow Rate: O2 Flow Rate (L/min): (S) 8 L/min  Intake/output summary:  Intake/Output Summary (Last 24 hours) at 04/03/2023 1237 Last data filed at 04/03/2023 1200 Gross per 24 hour  Intake 939.37 ml  Output --  Net 939.37 ml    LBM: Last BM Date : 03/28/23 Baseline Weight: Weight: 82.1 kg Most recent weight: Weight: 78.2 kg       Palliative Assessment/Data: 30%      Patient Active Problem List   Diagnosis Date Noted   Acute encephalopathy 04/02/2023   Chronic thoracic back pain 03/28/2023   Fever 03/23/2023   Chronic diastolic CHF (congestive heart failure) (HCC) 03/21/2023   Thrombocytopenia (HCC) 03/21/2023   ESRD on dialysis (HCC) 03/21/2023   Acute osteomyelitis of thoracic spine (HCC) 02/27/2023   Vertebral osteomyelitis (HCC) 01/28/2023   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   Osteomyelitis (HCC) 06/20/2022   Estrogen deficiency 05/25/2022   CKD (chronic kidney disease) stage 5, GFR less than 15 ml/min (HCC) 02/14/2022   Prolonged QT interval 01/07/2022   Streptococcal bacteremia    Osteomyelitis of left foot (HCC) 10/16/2021   Diabetic foot ulcers (HCC) 10/16/2021   Portal hypertension (HCC) 08/16/2021   Eczema of scalp 06/27/2021   Hypothyroidism 06/27/2021   Pruritus 06/27/2021   Seasonal allergic rhinitis due to pollen 02/02/2021    PTSD (post-traumatic stress disorder)    Osteoarthritis    MRSA (methicillin resistant Staphylococcus aureus)    Hypertension    High cholesterol    Hepatic steatosis    GERD (gastroesophageal reflux disease)    Fibromyalgia    Episode of visual loss of left eye    Depression    Constipation    Cataract    Bulging lumbar disc    Asthma    Anxiety    Anemia    Tinea capitis 10/11/2020   Tinea corporis 10/11/2020   Hip pain 05/16/2020   IDA (iron deficiency anemia) 05/04/2020   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) 03/16/2020   S/P lumbar fusion 03/04/2020   Spinal stenosis of lumbar region 01/22/2020   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) 01/21/2020   Compression fracture of L1 lumbar vertebra (HCC) 11/17/2019   Fracture of multiple ribs 11/17/2019   Nausea & vomiting 11/15/2019   Diarrhea 09/04/2019   Pressure injury of skin 04/17/2019   Septic arthritis of elbow, right (HCC) 04/17/2019   Retinopathy 04/17/2019   Renal transplant, 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   Sleep apnea 11/16/2017   Diabetic foot infection (HCC) 04/06/2017   Type 2 diabetes mellitus with hyperglycemia, with long-term current use of insulin (HCC) 04/06/2017   Neuropathy 05/05/2015   Diabetic peripheral neuropathy (HCC) 01/12/2015   CKD (chronic kidney disease), stage III (HCC) 05/27/2014   History of MI (myocardial infarction) 10/06/2013   Chest pain 10/06/2013   Nausea and vomiting 10/06/2013   Obesity (BMI 30-39.9) 09/20/2013   Multinodular goiter 04/17/2013   Normocytic anemia 02/03/2013   CAD (coronary artery disease) 01/11/2012   UTI (urinary tract infection) 08/05/2010   Hepatic cirrhosis (  HCC) 07/06/2010   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   Hyperlipidemia 06/03/2008   Essential hypertension 12/23/2006   Acute MI [facility]) 2007    Palliative Care Assessment & Plan   Patient Profile: [name] [name] is a 67 y.o. adult with medical history significant of  CKD 5,diabetes, neuropathy, thrombocytopenia, OSA, osteomyelitis, diabetic foot infections, anemia, hypothyroidism, hypertension, hyperlipidemia, CAD, cirrhosis, asthma, spinal stenosis, status post lumbar fusion, PTSD, depression, anxiety, obesity, fibromyalgia, DVT, cataracts presenting with worsening back pain, nausea, vomiting. She presented to the hospital for back pain, abdominal pain, nausea, and vomiting and was admitted on 02/13/2023. She was transferred to ICU on 04/01/23 for somnolence/lethargy and bradypnea.  Assessment: Patient was in bed on high flow oxygen by nasal cannula and CRRT was getting started. She recognized me and asked me how I had been. She is much more alert than yesterday.   I called her husband [name] [name]. He is encouraged with her improved condition since Sunday. He is taking things one day at a time. He has no questions or concerns.  Recommendations/Plan: Full code/ Full scope of care Continued conversations regarding GOC Continued PMT support  Goals of Care and Additional Recommendations: Limitations on Scope of Treatment: Full Scope Treatment  Code Status:    Code Status Orders  (From admission, onward)           Start     Ordered   02/23/2023 1448  Full code  Continuous       Question:  By:  Answer:  Consent: discussion documented in EHR   02/27/2023 1449           Code Status History     Date Active Date Inactive Code Status Order ID Comments User Context   01/28/2023 2336 02/04/2023 1730 Full Code [id number]  John Giovanni, MD ED   06/21/2022 0025 07/11/2022 2159 Full Code [id number]  Carollee Herter, DO ED   01/07/2022 0511 01/09/2022 2045 Full Code [id number]  Howerter, Chaney Born, DO Inpatient   10/17/2021 0420 10/22/2021 1928 Full Code [id number]  Chotiner, Claudean Severance, MD ED   02/18/2020 2241 02/26/2020 2334 Full Code [id number]  Lewie Chamber, MD Inpatient   01/29/2020 1704 02/18/2020 2044 Full Code [id number]  Charlton Amor, PA-C Inpatient   01/29/2020 1704  01/29/2020 1704 Full Code [id number]  Charlton Amor, PA-C Inpatient   01/22/2020 2141 01/29/2020 1650 Full Code [id number]  Bedelia Person, MD Inpatient   01/21/2020 0012 01/22/2020 2141 Full Code [id number]  Eduard Clos, MD Inpatient   11/15/2019 2200 11/18/2019 2226 Full Code [id number]  Pearson Grippe, MD ED   04/15/2019 2122 04/21/2019 2320 Full Code [id number]  Rometta Emery, MD Inpatient   04/06/2017 2004 04/11/2017 2109 Full Code [id number]  Alberteen Sam, MD Inpatient   09/29/2016 1831 10/02/2016 1643 Full Code [id number]  Nadara Mustard, MD Inpatient   03/19/2013 1507 03/21/2013 1812 Full Code [id number]  Nadara Mustard, MD Inpatient   02/10/2013 2124 02/20/2013 1859 Full Code [id number]  Nadara Mustard, MD Inpatient   02/03/2013 2137 02/10/2013 2124 Full Code [id number]  Cristal Ford, MD Inpatient   01/11/2012 0612 01/13/2012 1645 Full Code [id number]  Wilder Glade, RN Inpatient       Prognosis:  Unable to determine  Discharge Planning: To Be Determined  Care plan was discussed with Dr. Craige Cotta and bedside RN  Thank you for allowing the Palliative Medicine Team to assist in the care of this patient.  Time spent: 50 min   Detailed  review of medical records ( labs, imaging, vital signs), medically appropriate exam, counseling and education to patient's husband [name] [name], documenting clinical information, medication management, coordination of care.  Sherryll Burger, NP  Please contact Palliative Medicine Team phone at [phone] for questions and concerns.

## 2023-04-03 NOTE — Progress Notes (Signed)
Pt endorses shortness of breath. This RT offered bipap to pt to which she refused at this time. No distress noted. RN aware, RT will continue to monitor and be available as needed.

## 2023-04-04 ENCOUNTER — Ambulatory Visit: Payer: PRIVATE HEALTH INSURANCE | Admitting: Internal Medicine

## 2023-04-04 ENCOUNTER — Inpatient Hospital Stay (HOSPITAL_COMMUNITY): Payer: PRIVATE HEALTH INSURANCE

## 2023-04-04 DIAGNOSIS — M4624 Osteomyelitis of vertebra, thoracic region: Secondary | ICD-10-CM | POA: Diagnosis not present

## 2023-04-04 DIAGNOSIS — M546 Pain in thoracic spine: Secondary | ICD-10-CM

## 2023-04-04 DIAGNOSIS — I2510 Atherosclerotic heart disease of native coronary artery without angina pectoris: Secondary | ICD-10-CM | POA: Diagnosis not present

## 2023-04-04 DIAGNOSIS — I5032 Chronic diastolic (congestive) heart failure: Secondary | ICD-10-CM | POA: Diagnosis not present

## 2023-04-04 LAB — RENAL FUNCTION PANEL
Albumin: 2.7 g/dL — ABNORMAL LOW (ref 3.5–5.0)
Albumin: 2.6 g/dL — ABNORMAL LOW (ref 3.5–5.0)
Anion gap: 19 — ABNORMAL HIGH (ref 5–15)
Anion gap: 14 (ref 5–15)
BUN: 42 mg/dL — ABNORMAL HIGH (ref 8–23)
BUN: 31 mg/dL — ABNORMAL HIGH (ref 8–23)
CO2: 20 mmol/L — ABNORMAL LOW (ref 22–32)
CO2: 21 mmol/L — ABNORMAL LOW (ref 22–32)
Calcium: 8.8 mg/dL — ABNORMAL LOW (ref 8.9–10.3)
Calcium: 8.6 mg/dL — ABNORMAL LOW (ref 8.9–10.3)
Chloride: 94 mmol/L — ABNORMAL LOW (ref 98–111)
Chloride: 96 mmol/L — ABNORMAL LOW (ref 98–111)
Creatinine, Ser: 2.9 mg/dL — ABNORMAL HIGH (ref 0.44–1.00)
Creatinine, Ser: 1.76 mg/dL — ABNORMAL HIGH (ref 0.44–1.00)
GFR, Estimated: 17 mL/min — ABNORMAL LOW (ref >60)
GFR, Estimated: 31 mL/min — ABNORMAL LOW (ref >60)
Glucose, Bld: 156 mg/dL — ABNORMAL HIGH (ref 70–99)
Glucose, Bld: 259 mg/dL — ABNORMAL HIGH (ref 70–99)
Phosphorus: 2.4 mg/dL — ABNORMAL LOW (ref 2.5–4.6)
Phosphorus: 2.7 mg/dL (ref 2.5–4.6)
Potassium: 4.2 mmol/L (ref 3.5–5.1)
Potassium: 4 mmol/L (ref 3.5–5.1)
Sodium: 133 mmol/L — ABNORMAL LOW (ref 135–145)
Sodium: 131 mmol/L — ABNORMAL LOW (ref 135–145)

## 2023-04-04 LAB — GLUCOSE, CAPILLARY
Glucose-Capillary: 174 mg/dL — ABNORMAL HIGH (ref 70–99)
Glucose-Capillary: 174 mg/dL — ABNORMAL HIGH (ref 70–99)
Glucose-Capillary: 229 mg/dL — ABNORMAL HIGH (ref 70–99)

## 2023-04-04 LAB — MAGNESIUM
Magnesium: 2.1 mg/dL (ref 1.7–2.4)
Magnesium: 2.4 mg/dL (ref 1.7–2.4)

## 2023-04-04 MED ORDER — APIXABAN 2.5 MG PO TABS
2.5000 mg | ORAL_TABLET | Freq: Two times a day (BID) | ORAL | Status: DC
  Administered 2023-04-04 (×2): 2.5 mg
  Filled 2023-04-04 (×2): qty 1

## 2023-04-04 MED ORDER — CALCIUM CARBONATE ANTACID 500 MG PO CHEW
1.0000 | CHEWABLE_TABLET | Freq: Every day | ORAL | Status: DC
  Administered 2023-04-04: 200 mg
  Filled 2023-04-04: qty 1

## 2023-04-04 MED ORDER — SODIUM PHOSPHATES 45 MMOLE/15ML IV SOLN
15.0000 mmol | Freq: Once | INTRAVENOUS | Status: DC
  Filled 2023-04-04: qty 5

## 2023-04-04 MED ORDER — SODIUM CHLORIDE 0.9 % IV SOLN: INTRAVENOUS | Status: DC | PRN

## 2023-04-04 MED ORDER — SENNOSIDES-DOCUSATE SODIUM 8.6-50 MG PO TABS
1.0000 | ORAL_TABLET | Freq: Two times a day (BID) | ORAL | Status: DC
  Administered 2023-04-04 (×2): 1
  Filled 2023-04-04 (×2): qty 1

## 2023-04-04 MED ORDER — ACETAMINOPHEN 500 MG PO TABS
1000.0000 mg | ORAL_TABLET | Freq: Three times a day (TID) | ORAL | Status: DC
  Administered 2023-04-04 (×3): 1000 mg
  Filled 2023-04-04 (×3): qty 2

## 2023-04-04 MED ORDER — PREGABALIN 50 MG PO CAPS
50.0000 mg | ORAL_CAPSULE | Freq: Every day | ORAL | Status: DC
  Administered 2023-04-04: 50 mg
  Filled 2023-04-04: qty 1

## 2023-04-04 MED ORDER — MIDODRINE HCL 5 MG PO TABS
10.0000 mg | ORAL_TABLET | Freq: Three times a day (TID) | ORAL | Status: DC
  Administered 2023-04-04 – 2023-04-05 (×3): 10 mg
  Filled 2023-04-04 (×3): qty 2

## 2023-04-04 MED ORDER — RENA-VITE PO TABS
1.0000 | ORAL_TABLET | Freq: Every day | ORAL | Status: DC
  Administered 2023-04-04: 1
  Filled 2023-04-04: qty 1

## 2023-04-04 MED ORDER — LORATADINE 10 MG PO TABS
10.0000 mg | ORAL_TABLET | Freq: Every day | ORAL | Status: DC
  Administered 2023-04-04: 10 mg
  Filled 2023-04-04: qty 1

## 2023-04-04 MED ORDER — METHOCARBAMOL 500 MG PO TABS
500.0000 mg | ORAL_TABLET | Freq: Three times a day (TID) | ORAL | Status: DC
  Administered 2023-04-04 (×3): 500 mg
  Filled 2023-04-04 (×3): qty 1

## 2023-04-04 MED ORDER — PIVOT 1.5 CAL PO LIQD
1000.0000 mL | ORAL | Status: DC
  Administered 2023-04-04: 1000 mL
  Filled 2023-04-04 (×2): qty 1000

## 2023-04-04 MED ORDER — POLYETHYLENE GLYCOL 3350 17 G PO PACK
17.0000 g | PACK | Freq: Two times a day (BID) | ORAL | Status: DC
  Administered 2023-04-04 (×2): 17 g
  Filled 2023-04-04 (×2): qty 1

## 2023-04-04 MED ORDER — BISACODYL 10 MG RE SUPP
10.0000 mg | Freq: Once | RECTAL | Status: AC
  Administered 2023-04-04: 10 mg via RECTAL
  Filled 2023-04-04: qty 1

## 2023-04-04 MED ORDER — POTASSIUM & SODIUM PHOSPHATES 280-160-250 MG PO PACK
2.0000 | PACK | ORAL | Status: AC
  Administered 2023-04-04 (×2): 2
  Filled 2023-04-04 (×2): qty 2

## 2023-04-04 NOTE — Progress Notes (Addendum)
Upon initial assessment patient had eyes open, but not answering questions or following commands. ABG obtained and within normal limits (see results in chart). No oral meds given throughout shift due to somnolence and aspiration risk. Toward the end of the shift patient started following commands and answering questions appropriately. Husband insisted she gets a break from the bipap mask. Patient placed on 8L [place] from 05:42 until 06:03.   Husband is requesting to meet with palliative today to further discuss goals of care.

## 2023-04-04 NOTE — Procedures (Signed)
Arterial Catheter Insertion Procedure Note  [name] [name]  [id number]  Jul 16, 1956  Date:04/04/23  Time:6:33 PM    Provider Performing: Derinda Late    Procedure: Insertion of Arterial Line (40981) without US guidance  Indication(s) Blood pressure monitoring and/or need for frequent ABGs  Consent Risks of the procedure as well as the alternatives and risks of each were explained to the patient and/or caregiver.  Consent for the procedure was obtained and is signed in the bedside chart  Anesthesia None   Time Out Verified patient identification, verified procedure, site/side was marked, verified correct patient position, special equipment/implants available, medications/allergies/relevant history reviewed, required imaging and test results available.   Sterile Technique Maximal sterile technique including full sterile barrier drape, hand hygiene, sterile gown, sterile gloves, mask, hair covering, sterile ultrasound probe cover (if used).   Procedure Description Area of catheter insertion was cleaned with chlorhexidine and draped in sterile fashion. Without real-time ultrasound guidance an arterial catheter was placed into the right radial artery.  Appropriate arterial tracings confirmed on monitor.     Complications/Tolerance None; patient tolerated the procedure well.   EBL Minimal   Specimen(s) None
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
## 2023-04-04 NOTE — Progress Notes (Signed)
PT Cancellation Note  Patient Details Name: [name] [name] MRN: [id number] DOB: Feb 09, 1956   Cancelled Treatment:    Reason Eval/Treat Not Completed: Medical issues which prohibited therapy (Nurse states pt just got comfortable and not to disturb her at this time. Will return as able.)   Bevelyn Buckles 04/04/2023, 12:50 PM Kaiya Boatman M,PT Acute Rehab Services [phone]

## 2023-04-04 NOTE — Procedures (Signed)
Cortrak  Person Inserting Tube:  Kielyn Kardell D, RD Tube Type:  Cortrak - 43 inches Tube Size:  10 Tube Location:  Left nare Secured by: Bridle Technique Used to Measure Tube Placement:  Marking at nare/corner of mouth Cortrak Secured At:  70 cm Procedure Comments:  Cortrak Tube Team Note:  Consult received to place a Cortrak feeding tube.   X-ray is required, abdominal x-ray has been ordered by the Cortrak team. Please confirm tube placement before using the Cortrak tube.   If the tube becomes dislodged please keep the tube and contact the Cortrak team at [email or web address] for replacement.  If after hours and replacement cannot be delayed, place a NG tube and confirm placement with an abdominal x-ray.    Chaselynn Kepple, RD, LDN Clinical Dietitian RD pager # available in AMION  After hours/weekend pager # available in AMION    

## 2023-04-04 NOTE — Progress Notes (Signed)
Brief Nutrition Support Note  Discussed pt with RN and during ICU rounds. Consult received for enteral nutrition initiation and management. Cortrak placed this morning with abdominal x-ray showing tip of tube in the distal stomach/proximal duodenum. Pt was on BiPAP but currently off. Discussed with MD. Molli Knock to use Cortrak tube in current position.  Pt last seen by RD for follow-up yesterday. A Cortrak tube and initiation of enteral nutrition was recommended at that time. Tube feeding recommendations were provided and remain appropriate. RD to order.  Initiate tube feeds via Cortrak: - Start Pivot 1.5 @ 20 ml/hr and advance rate by 10 ml q 8 hours to goal rate of 50 ml/hr (1200 ml/day)  Tube feeding regimen at goal rate provides 1800 kcal, 112.5 grams of protein, and 900 ml of H2O.   RD will continue to follow pt during admission.   Mertie Clause, MS, RD, LDN Inpatient Clinical Dietitian Please see AMiON for contact information.

## 2023-04-04 NOTE — Progress Notes (Signed)
[name], adult   DOB: Jan 29, 1956, 67 y.o.   MRN: [id number]    Progress Note from the Palliative Medicine Team at [facility]   Patient Name: [name] [name]        Date: 04/04/2023 DOB: 1955/11/12  Age: 67 y.o. MRN#: [id number] Attending Physician: Coralyn Helling, MD Primary Care Physician: Zola Button, Grayling Congress, DO Admit Date: 02/11/2023   Medical records reviewed   [name] [name] is a 67 y.o. adult with medical history significant of CKD 5,diabetes, neuropathy, thrombocytopenia, OSA, osteomyelitis, diabetic foot infections, anemia, hypothyroidism, hypertension, hyperlipidemia, CAD, cirrhosis, asthma, spinal stenosis, status post lumbar fusion, PTSD, depression, anxiety, obesity, fibromyalgia, DVT, cataracts presenting with worsening back pain, nausea, vomiting.   She presented to the hospital for back pain, abdominal pain, nausea, and vomiting and was admitted on 02/23/2023.    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 Chestine Spore on 5/15 -> positive thrill -She was receiving dialysis MWF schedule with some complication including intradialytic hypotension. -Started CRRT on 04/03/2023 for fluid overload/respite distress.  She is tolerating CRRT okay with UF around 100-150 cc an hour.  We will continue current prescription.  Discussed with ICU nurse.  All 4K bath.  She was transferred to ICU on 04/01/23 for somnolence/lethargy and bradypnea.   Today is day 32 of this hospital stay   Patient and family face treatment option decisions, advanced directive decsions and anticipatory care needs.    This NP assessed patient at the bedside as a follow up to  yesterday's GOCs meeting.        Education offered today regarding  the importance of continued conversation with family and their  medical providers regarding overall plan of care and treatment options,  ensuring decisions are within  the context of the patients values and GOCs.  Questions and concerns addressed   Discussed with Dr Craige Cotta    Time:  Detailed review of medical records ( labs, imaging, vital signs), medically appropriate exam ( MS, skin, resp)   discussed with treatment team, counseling and education to patient, family, staff, documenting clinical information, medication management, coordination of care    Lorinda Creed NP  Palliative Medicine Team Team Phone # [phone] Pager [phone]

## 2023-04-04 NOTE — Progress Notes (Signed)
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 -TDC placement 03/25/2023 - CLIP to AF MWF 1145 (arrive 1115am 1st day) - BCF by Dr. Chestine Spore on 5/15 -> positive thrill -She was receiving dialysis MWF schedule with some complication including intradialytic hypotension. -Started CRRT on 04/03/2023 for fluid overload/respite distress.  She is tolerating CRRT okay with UF around 100-150 cc an hour.  We will continue current prescription.  Discussed with ICU nurse.  All 4K bath.  # Anemia: Continue ESA  Required blood transfusion.Iron was OK.  Monitor and transfuse as needed.   # CKD-MBD/secondary hyperparathyroidism: Continue calcitriol and monitor lab.   # Hypotension/volume: Blood pressure is low therefore currently on a small dose of Levophed and midodrine.  UF as tolerated.   # Osteomyelitis, seen by ID and switched antibiotic to vancomycin.   # Hyponatremia, hypervolemic: Managed with dialysis/   fluid restriction.   # Acute febrile illness: Chest x-ray negative.  On antibiotics for OM.  Blood culture negative so far.  Catheter site clean.  Afebrile now.  # Hypokalemia:  managed with dialysis.  # Acute metabolic/toxic encephalopathy: Received Narcan and was moved to ICU on 6/3.  She opens eyes but is still somnolent.  Subjective: Seen and examined in ICU.  Tolerating CRRT well.  Mental status remains same without much change.  Currently on BiPAP.  Review of system limited.  Discussed with ICU nurse.  Objective Vital signs in last 24 hours: Vitals:   04/04/23 0630 04/04/23 0645 04/04/23 0700 04/04/23 0710  BP: (!) 118/47 (!) 99/54 (!) 161/45   Pulse: (!) 106 (!) 110 (!) 107   Resp: (!) 23 (!) 24 (!) 23   Temp:    99.8 F (37.7 C)  TempSrc:    Axillary  SpO2: 93% 97% 98%   Weight:      Height:       Weight change: -3.94 kg  Intake/Output Summary (Last 24 hours) at 04/04/2023 0758 Last data filed at 04/04/2023  0700 Gross per 24 hour  Intake 704.4 ml  Output 2267 ml  Net -1562.6 ml        Labs: RENAL PANEL Recent Labs  Lab 04/01/23 0109 04/02/23 0054 04/03/23 0039 04/03/23 1622 04/03/23 2027 04/04/23 0122  NA 129* 130* 135 133* 134* 133*  K 3.5 4.7 3.8 4.1 3.9 4.2  CL 91* 89* 93* 95*  --  94*  CO2 23 22 22  18*  --  20*  GLUCOSE 133* 140* 131* 132*  --  156*  BUN 41* 60* 43* 55*  --  42*  CREATININE 5.13* 6.33* 4.09* 4.02*  --  2.90*  CALCIUM 8.4* 8.4* 8.6* 8.5*  --  8.8*  MG  --   --   --   --   --  2.1  PHOS 3.6 5.3* 3.8 3.1  --  2.4*  ALBUMIN 2.6* 2.7* 3.0* 2.7*  --  2.7*     Liver Function Tests: Recent Labs  Lab 03/30/23 0834 03/31/23 0056 04/03/23 0039 04/03/23 1622 04/04/23 0122  AST 24  --   --   --   --   ALT 18  --   --   --   --   ALKPHOS 121  --   --   --   --   BILITOT 2.1*  --   --   --   --   PROT 7.2  --   --   --   --  ALBUMIN 3.2*   < > 3.0* 2.7* 2.7*   < > = values in this interval not displayed.    No results for input(s): "LIPASE", "AMYLASE" in the last 168 hours. No results for input(s): "AMMONIA" in the last 168 hours. CBC: Recent Labs    07/26/22 1258 08/09/22 1343 09/06/22 1325 09/06/22 1326 09/07/22 1433 10/10/22 1308 11/08/22 1243 11/15/22 1334 12/06/22 1326 12/06/22 1327 12/13/22 1424 01/12/23 1419 01/28/23 1539 03/02/23 0402 03/02/23 1558 03/28/23 0314 03/29/23 1552 04/02/23 0855 04/03/23 0039 04/03/23 2027  HGB 8.0*   < > 10.0*  --  10.5*   < > 9.9*   < > 8.9*  --    < > 8.3*   < > 7.6*   < > 7.8* 7.6* 7.8*  7.7* 7.6* 8.5*  MCV 90.7   < > 88.7  --  86.7   < > 91.2   < > 95.5  --    < > 93.3   < > 91.5   < > 89.8 90.3 91.8  89.9 92.5  --   VITAMINB12  --   --   --   --  273  --   --   --   --   --   --   --   --   --   --   --   --   --   --   --   FERRITIN 394*  --   --  401*  --   --  258  --   --  209  --  277  --   --   --   --   --   --   --   --   TIBC 232*  --  258  --   --   --  256  --  269  --   --  263   --  217*  --   --   --   --   --   --   IRON 64  --  69  --   --   --  53  --  46  --   --  32  --  70  --   --   --   --   --   --   RETICCTPCT 2.2  --   --  1.4  --   --  2.1  --   --  2.1  --  2.6  --   --   --   --   --   --   --   --    < > = values in this interval not displayed.     Cardiac Enzymes: No results for input(s): "CKTOTAL", "CKMB", "CKMBINDEX", "TROPONINI" in the last 168 hours.  CBG: Recent Labs  Lab 04/02/23 1125 04/02/23 1502 04/02/23 2006 04/03/23 0314 04/03/23 1511  GLUCAP 145* 129* 79 126* 126*     Iron Studies: No results for input(s): "IRON", "TIBC", "TRANSFERRIN", "FERRITIN" in the last 72 hours. Studies/Results: No results found.  Medications: Infusions:   prismasol BGK 4/2.5 400 mL/hr at 04/04/23 0032    prismasol BGK 4/2.5 400 mL/hr at 04/04/23 0032   sodium chloride Stopped (04/04/23 0658)   sodium chloride     norepinephrine (LEVOPHED) Adult infusion 3 mcg/min (04/04/23 0700)   prismasol BGK 4/2.5 1,500 mL/hr at 04/04/23 0449   sodium phosphate 15 mmol in dextrose 5 % 250 mL infusion     vancomycin  200 mL/hr at 04/04/23 0700    Scheduled Medications:  acetaminophen  1,000 mg Oral TID   acidophilus  2 capsule Oral TID   apixaban  2.5 mg Oral BID   arformoterol  15 mcg Nebulization BID   budesonide (PULMICORT) nebulizer solution  0.25 mg Nebulization BID   calcitRIOL  0.5 mcg Oral Daily   calcium carbonate  1 tablet Oral Daily   Chlorhexidine Gluconate Cloth  6 each Topical Q0600   darbepoetin (ARANESP) injection - NON-DIALYSIS  100 mcg Subcutaneous Q Fri-1800   diclofenac  1 patch Transdermal BID   DULoxetine  60 mg Oral Daily   feeding supplement  237 mL Oral TID BM   loratadine  10 mg Oral Daily   methocarbamol  500 mg Oral TID   midodrine  10 mg Oral Q8H   multivitamin  1 tablet Oral QHS   mouth rinse  15 mL Mouth Rinse 4 times per day   polyethylene glycol  17 g Oral BID   pregabalin  50 mg Oral Daily   senna-docusate  1  tablet Oral BID   sodium chloride flush  10-40 mL Intracatheter Q12H   sodium chloride flush  3 mL Intravenous Q12H    have reviewed scheduled and prn medications.  Physical Exam: General: Somnolent and critically ill looking female on BiPAP, not really following commands. Heart:RRR, s1s2 nl Lungs: Coarse breath sound bilateral. Abdomen:soft,  non-distended Extremities: Trace LE edema present Dialysis Access: Left IJ in place, L BCF + T/B   [name] [name] 04/04/2023,7:58 AM  LOS: 36 days

## 2023-04-04 NOTE — Progress Notes (Signed)
[facility]    Date of Admission:  02/06/2023     ID: [name] [name] is a 67 y.o. 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)   IDA (iron deficiency anemia)   PTSD (post-traumatic stress disorder)   Hypertension   High cholesterol   GERD (gastroesophageal reflux disease)   Fibromyalgia   Depression   Asthma   Anxiety   Anemia   Hypothyroidism   CKD (chronic kidney disease) stage 5, GFR less than 15 ml/min (HCC)   Osteomyelitis (HCC)   Chronic diastolic CHF (congestive heart failure) (HCC)   Thrombocytopenia (HCC)   ESRD on dialysis (HCC)   Fever   Chronic thoracic back pain   Acute encephalopathy   Malnutrition of moderate degree    Subjective: Afebrile, undergoing hd, moving all extremities but still somewhat encephalopathic. Does follow commands  Medications:   acetaminophen  1,000 mg Per Tube TID   apixaban  2.5 mg Per Tube BID   arformoterol  15 mcg Nebulization BID   budesonide (PULMICORT) nebulizer solution  0.25 mg Nebulization BID   calcium carbonate  1 tablet Per Tube Daily   Chlorhexidine Gluconate Cloth  6 each Topical Q0600   darbepoetin (ARANESP) injection - NON-DIALYSIS  100 mcg Subcutaneous Q Fri-1800   diclofenac  1 patch Transdermal BID   loratadine  10 mg Per Tube Daily   methocarbamol  500 mg Per Tube  TID   midodrine  10 mg Per Tube Q8H   multivitamin  1 tablet Per Tube QHS   mouth rinse  15 mL Mouth Rinse 4 times per day   polyethylene glycol  17 g Per Tube BID   pregabalin  50 mg Per Tube Daily   senna-docusate  1 tablet Per Tube BID   sodium chloride flush  10-40 mL Intracatheter Q12H   sodium chloride flush  3 mL Intravenous Q12H    Objective: Vital signs in last 24 hours: Temp:  [96.6 F (35.9 C)-99.8 F (37.7 C)] 97 F (36.1 C) (06/05 1507) Pulse Rate:  [78-115] 81 (06/05 1430) Resp:  [9-30] 15 (06/05 1430) BP: (84-161)/(33-62) 111/47 (06/05 1430) SpO2:  [89 %-100 %] 96 % (06/05 1430) FiO2 (%):  [40 %] 40 % (06/05 0810) Weight:  [74.9 kg] 74.9 kg (06/05 0451)  Physical Exam  Constitutional:  oriented to person, place, and time. appears well-developed and well-nourished. No distress.  HENT: [place]/AT, PERRLA, no scleral icterus Mouth/Throat: Oropharynx is clear and moist. No oropharyngeal exudate.  Cardiovascular: Normal rate, regular rhythm and normal heart sounds. Exam reveals no gallop and no friction rub.  No murmur heard.  Pulmonary/Chest: Effort normal and breath sounds normal. No respiratory distress.  has no wheezes.  Neck = supple, no nuchal rigidity Abdominal: Soft. Bowel sounds are normal.  exhibits no distension. There is no tenderness.  Lymphadenopathy: no cervical adenopathy. No axillary adenopathy Neurological: alert and oriented to person, place, and time.  Skin: Skin is warm and dry. No rash noted. No erythema.  Psychiatric: a normal mood and affect.  behavior is normal.    Lab Results Recent Labs    04/02/23 0855 04/03/23 0039 04/03/23 1622 04/03/23 2027 04/04/23 0122  WBC 18.2*  17.6* 14.8*  --   --   --   HGB 7.8*  7.7* 7.6*  --  8.5*  --   HCT 25.7*  24.8* 24.8*  --  25.0*  --   NA  --  135 133* 134* 133*  K  --  3.8 4.1 3.9 4.2  CL  --  93* 95*  --  94*  CO2  --  22 18*  --  20*  BUN  --  43* 55*  --  42*  CREATININE  --  4.09* 4.02*   --  2.90*   Liver Panel Recent Labs    04/03/23 1622 04/04/23 0122  ALBUMIN 2.7* 2.7*   Sedimentation Rate No results for input(s): "ESRSEDRATE" in the last 72 hours. C-Reactive Protein No results for input(s): "CRP" in the last 72 hours.  Microbiology: reivewed Studies/Results: DG Abd Portable 1V  Result Date: 04/04/2023 CLINICAL DATA:  Feeding tube placement. EXAM: PORTABLE ABDOMEN - 1 VIEW COMPARISON:  None Available. FINDINGS: Tip of the weighted enteric tube in the right upper quadrant in the region of the distal stomach/proximal duodenum. No bowel dilatation in the included upper abdomen. IMPRESSION: Tip of the weighted enteric tube in the right upper quadrant in the region of the distal stomach/proximal duodenum. Electronically Signed   By: Narda Rutherford M.D.   On: 04/04/2023 09:58     Assessment/Plan: MRSE thoracolumbar OM = continue on vancomycin with HD until mid June. Will check sed rate and crp  Acute hypoxic respiratory failure = improved.  Leukocytosis = trending downward with treatment  Ckd on ESRD = continue on current regimen of M-W-F   [facility] Pager: [phone]  04/04/2023, 3:13 PM

## 2023-04-05 ENCOUNTER — Inpatient Hospital Stay (HOSPITAL_COMMUNITY): Payer: PRIVATE HEALTH INSURANCE

## 2023-04-05 DIAGNOSIS — E875 Hyperkalemia: Secondary | ICD-10-CM

## 2023-04-05 DIAGNOSIS — A419 Sepsis, unspecified organism: Secondary | ICD-10-CM | POA: Diagnosis not present

## 2023-04-05 DIAGNOSIS — R6521 Severe sepsis with septic shock: Secondary | ICD-10-CM | POA: Diagnosis not present

## 2023-04-05 DIAGNOSIS — N186 End stage renal disease: Secondary | ICD-10-CM | POA: Diagnosis not present

## 2023-04-05 DIAGNOSIS — J9600 Acute respiratory failure, unspecified whether with hypoxia or hypercapnia: Secondary | ICD-10-CM

## 2023-04-05 DIAGNOSIS — M4624 Osteomyelitis of vertebra, thoracic region: Secondary | ICD-10-CM | POA: Diagnosis not present

## 2023-04-05 LAB — POCT I-STAT 7, (LYTES, BLD GAS, ICA,H+H)
Acid-base deficit: 11 mmol/L — ABNORMAL HIGH (ref 0.0–2.0)
Acid-base deficit: 20 mmol/L — ABNORMAL HIGH (ref 0.0–2.0)
Acid-base deficit: 14 mmol/L — ABNORMAL HIGH (ref 0.0–2.0)
Acid-base deficit: 8 mmol/L — ABNORMAL HIGH (ref 0.0–2.0)
Acid-base deficit: 20 mmol/L — ABNORMAL HIGH (ref 0.0–2.0)
Bicarbonate: 13.5 mmol/L — ABNORMAL LOW (ref 20.0–28.0)
Bicarbonate: 13.1 mmol/L — ABNORMAL LOW (ref 20.0–28.0)
Bicarbonate: 16.8 mmol/L — ABNORMAL LOW (ref 20.0–28.0)
Bicarbonate: 20.8 mmol/L (ref 20.0–28.0)
Bicarbonate: 10.7 mmol/L — ABNORMAL LOW (ref 20.0–28.0)
Calcium, Ion: 1.1 mmol/L — ABNORMAL LOW (ref 1.15–1.40)
Calcium, Ion: 1.25 mmol/L (ref 1.15–1.40)
Calcium, Ion: 1.03 mmol/L — ABNORMAL LOW (ref 1.15–1.40)
Calcium, Ion: 1.2 mmol/L (ref 1.15–1.40)
Calcium, Ion: 1.02 mmol/L — ABNORMAL LOW (ref 1.15–1.40)
HCT: 31 % — ABNORMAL LOW (ref 36.0–46.0)
HCT: 29 % — ABNORMAL LOW (ref 36.0–46.0)
HCT: 27 % — ABNORMAL LOW (ref 36.0–46.0)
HCT: 29 % — ABNORMAL LOW (ref 36.0–46.0)
HCT: 30 % — ABNORMAL LOW (ref 36.0–46.0)
Hemoglobin: 10.5 g/dL — ABNORMAL LOW (ref 12.0–15.0)
Hemoglobin: 9.9 g/dL — ABNORMAL LOW (ref 12.0–15.0)
Hemoglobin: 9.2 g/dL — ABNORMAL LOW (ref 12.0–15.0)
Hemoglobin: 9.9 g/dL — ABNORMAL LOW (ref 12.0–15.0)
Hemoglobin: 10.2 g/dL — ABNORMAL LOW (ref 12.0–15.0)
O2 Saturation: 97 %
O2 Saturation: 98 %
O2 Saturation: 91 %
O2 Saturation: 95 %
O2 Saturation: 86 %
Patient temperature: 97.5
Patient temperature: 98.5
Patient temperature: 98.5
Patient temperature: 98
Potassium: 6.1 mmol/L — ABNORMAL HIGH (ref 3.5–5.1)
Potassium: 6.7 mmol/L (ref 3.5–5.1)
Potassium: 6.6 mmol/L (ref 3.5–5.1)
Potassium: 6.1 mmol/L — ABNORMAL HIGH (ref 3.5–5.1)
Potassium: 7.3 mmol/L (ref 3.5–5.1)
Sodium: 135 mmol/L (ref 135–145)
Sodium: 133 mmol/L — ABNORMAL LOW (ref 135–145)
Sodium: 134 mmol/L — ABNORMAL LOW (ref 135–145)
Sodium: 135 mmol/L (ref 135–145)
Sodium: 129 mmol/L — ABNORMAL LOW (ref 135–145)
TCO2: 14 mmol/L — ABNORMAL LOW (ref 22–32)
TCO2: 15 mmol/L — ABNORMAL LOW (ref 22–32)
TCO2: 19 mmol/L — ABNORMAL LOW (ref 22–32)
TCO2: 22 mmol/L (ref 22–32)
TCO2: 12 mmol/L — ABNORMAL LOW (ref 22–32)
pCO2 arterial: 23.6 mmHg — ABNORMAL LOW (ref 32–48)
pCO2 arterial: 68.8 mmHg (ref 32–48)
pCO2 arterial: 62.9 mmHg — ABNORMAL HIGH (ref 32–48)
pCO2 arterial: 54.9 mmHg — ABNORMAL HIGH (ref 32–48)
pCO2 arterial: 45.5 mmHg (ref 32–48)
pH, Arterial: 7.362 (ref 7.35–7.45)
pH, Arterial: 6.888 — CL (ref 7.35–7.45)
pH, Arterial: 7.034 — CL (ref 7.35–7.45)
pH, Arterial: 7.186 — CL (ref 7.35–7.45)
pH, Arterial: 6.978 — CL (ref 7.35–7.45)
pO2, Arterial: 85 mmHg (ref 83–108)
pO2, Arterial: 186 mmHg — ABNORMAL HIGH (ref 83–108)
pO2, Arterial: 87 mmHg (ref 83–108)
pO2, Arterial: 96 mmHg (ref 83–108)
pO2, Arterial: 77 mmHg — ABNORMAL LOW (ref 83–108)

## 2023-04-05 LAB — COMPREHENSIVE METABOLIC PANEL
ALT: 97 U/L — ABNORMAL HIGH (ref 0–44)
AST: 326 U/L — ABNORMAL HIGH (ref 15–41)
Albumin: 2.2 g/dL — ABNORMAL LOW (ref 3.5–5.0)
Alkaline Phosphatase: 265 U/L — ABNORMAL HIGH (ref 38–126)
Anion gap: 28 — ABNORMAL HIGH (ref 5–15)
BUN: 29 mg/dL — ABNORMAL HIGH (ref 8–23)
CO2: 8 mmol/L — ABNORMAL LOW (ref 22–32)
Calcium: 10.3 mg/dL (ref 8.9–10.3)
Chloride: 101 mmol/L (ref 98–111)
Creatinine, Ser: 1.91 mg/dL — ABNORMAL HIGH (ref 0.44–1.00)
GFR, Estimated: 28 mL/min — ABNORMAL LOW (ref >60)
Glucose, Bld: 61 mg/dL — ABNORMAL LOW (ref 70–99)
Potassium: 6.6 mmol/L (ref 3.5–5.1)
Sodium: 137 mmol/L (ref 135–145)
Total Bilirubin: 2.7 mg/dL — ABNORMAL HIGH (ref 0.3–1.2)
Total Protein: 6.6 g/dL (ref 6.5–8.1)

## 2023-04-05 LAB — CBC
HCT: 30.4 % — ABNORMAL LOW (ref 36.0–46.0)
Hemoglobin: 8.4 g/dL — ABNORMAL LOW (ref 12.0–15.0)
MCH: 27.7 pg (ref 26.0–34.0)
MCHC: 27.6 g/dL — ABNORMAL LOW (ref 30.0–36.0)
MCV: 100.3 fL — ABNORMAL HIGH (ref 80.0–100.0)
Platelets: 147 10*3/uL — ABNORMAL LOW (ref 150–400)
RBC: 3.03 MIL/uL — ABNORMAL LOW (ref 3.87–5.11)
RDW: 18.6 % — ABNORMAL HIGH (ref 11.5–15.5)
WBC: 39.8 10*3/uL — ABNORMAL HIGH (ref 4.0–10.5)
nRBC: 3.5 % — ABNORMAL HIGH (ref 0.0–0.2)

## 2023-04-05 LAB — RENAL FUNCTION PANEL
Albumin: 2.4 g/dL — ABNORMAL LOW (ref 3.5–5.0)
Albumin: 1.9 g/dL — ABNORMAL LOW (ref 3.5–5.0)
Anion gap: 20 — ABNORMAL HIGH (ref 5–15)
Anion gap: 31 — ABNORMAL HIGH (ref 5–15)
BUN: 30 mg/dL — ABNORMAL HIGH (ref 8–23)
BUN: 24 mg/dL — ABNORMAL HIGH (ref 8–23)
CO2: 13 mmol/L — ABNORMAL LOW (ref 22–32)
CO2: 11 mmol/L — ABNORMAL LOW (ref 22–32)
Calcium: 8.8 mg/dL — ABNORMAL LOW (ref 8.9–10.3)
Calcium: 8.9 mg/dL (ref 8.9–10.3)
Chloride: 98 mmol/L (ref 98–111)
Chloride: 93 mmol/L — ABNORMAL LOW (ref 98–111)
Creatinine, Ser: 1.8 mg/dL — ABNORMAL HIGH (ref 0.44–1.00)
Creatinine, Ser: 1.95 mg/dL — ABNORMAL HIGH (ref 0.44–1.00)
GFR, Estimated: 30 mL/min — ABNORMAL LOW (ref >60)
GFR, Estimated: 28 mL/min — ABNORMAL LOW (ref >60)
Glucose, Bld: 221 mg/dL — ABNORMAL HIGH (ref 70–99)
Glucose, Bld: 26 mg/dL — CL (ref 70–99)
Phosphorus: 3.2 mg/dL (ref 2.5–4.6)
Phosphorus: 9.6 mg/dL — ABNORMAL HIGH (ref 2.5–4.6)
Potassium: 5.5 mmol/L — ABNORMAL HIGH (ref 3.5–5.1)
Potassium: 7.4 mmol/L (ref 3.5–5.1)
Sodium: 131 mmol/L — ABNORMAL LOW (ref 135–145)
Sodium: 135 mmol/L (ref 135–145)

## 2023-04-05 LAB — GLUCOSE, CAPILLARY
Glucose-Capillary: 100 mg/dL — ABNORMAL HIGH (ref 70–99)
Glucose-Capillary: 210 mg/dL — ABNORMAL HIGH (ref 70–99)
Glucose-Capillary: 26 mg/dL — CL (ref 70–99)
Glucose-Capillary: 87 mg/dL (ref 70–99)

## 2023-04-05 LAB — PROTIME-INR
INR: 3.4 — ABNORMAL HIGH (ref 0.8–1.2)
Prothrombin Time: 34.4 seconds — ABNORMAL HIGH (ref 11.4–15.2)

## 2023-04-05 LAB — TROPONIN I (HIGH SENSITIVITY)
Troponin I (High Sensitivity): 84 ng/L — ABNORMAL HIGH (ref <18)
Troponin I (High Sensitivity): 109 ng/L (ref <18)

## 2023-04-05 LAB — BPAM RBC
Blood Product Expiration Date: 202406222359
Unit Type and Rh: 600

## 2023-04-05 LAB — PREPARE RBC (CROSSMATCH)

## 2023-04-05 LAB — TYPE AND SCREEN: Unit division: 0

## 2023-04-05 LAB — MAGNESIUM: Magnesium: 2.7 mg/dL — ABNORMAL HIGH (ref 1.7–2.4)

## 2023-04-05 LAB — HEMOGLOBIN AND HEMATOCRIT, BLOOD
HCT: 29.5 % — ABNORMAL LOW (ref 36.0–46.0)
Hemoglobin: 8.5 g/dL — ABNORMAL LOW (ref 12.0–15.0)

## 2023-04-05 MED ORDER — PRISMASOL BGK 0/2.5 32-2.5 MEQ/L EC SOLN
Status: DC
  Filled 2023-04-05 (×8): qty 5000

## 2023-04-05 MED ORDER — NOREPINEPHRINE 4 MG/250ML-% IV SOLN
2.0000 ug/min | INTRAVENOUS | Status: DC
  Filled 2023-04-05: qty 250

## 2023-04-05 MED ORDER — DEXMEDETOMIDINE HCL IN NACL 400 MCG/100ML IV SOLN: 0.0000 ug/kg/h | INTRAVENOUS | Status: DC

## 2023-04-05 MED ORDER — INSULIN ASPART 100 UNIT/ML IJ SOLN: 0.0000 [IU] | Freq: Three times a day (TID) | INTRAMUSCULAR | Status: DC

## 2023-04-05 MED ORDER — MIDAZOLAM HCL 2 MG/2ML IJ SOLN
0.5000 mg | INTRAMUSCULAR | Status: DC | PRN
  Administered 2023-04-05 (×2): 1 mg via INTRAVENOUS
  Filled 2023-04-05 (×2): qty 2

## 2023-04-05 MED ORDER — PROTHROMBIN COMPLEX CONC HUMAN 500 UNITS IV KIT
2680.0000 [IU] | PACK | Status: AC
  Administered 2023-04-05: 2680 [IU] via INTRAVENOUS
  Filled 2023-04-05: qty 2156

## 2023-04-05 MED ORDER — GLYCOPYRROLATE 0.2 MG/ML IJ SOLN: 0.2000 mg | INTRAMUSCULAR | Status: DC | PRN

## 2023-04-05 MED ORDER — CALCIUM GLUCONATE-NACL 1-0.675 GM/50ML-% IV SOLN: 1.0000 g | Freq: Once | INTRAVENOUS | Status: DC

## 2023-04-05 MED ORDER — PHENYLEPHRINE HCL-NACL 20-0.9 MG/250ML-% IV SOLN: 25.0000 ug/min | INTRAVENOUS | Status: DC

## 2023-04-05 MED ORDER — INSULIN ASPART 100 UNIT/ML IJ SOLN: 0.0000 [IU] | INTRAMUSCULAR | Status: DC

## 2023-04-05 MED ORDER — SODIUM BICARBONATE 8.4 % IV SOLN: 50.0000 meq | Freq: Once | INTRAVENOUS | Status: DC

## 2023-04-05 MED ORDER — DEXTROSE 50 % IV SOLN
25.0000 g | Freq: Once | INTRAVENOUS | Status: AC
  Administered 2023-04-05: 25 g via INTRAVENOUS

## 2023-04-05 MED ORDER — FAMOTIDINE 20 MG PO TABS: 20.0000 mg | ORAL_TABLET | Freq: Two times a day (BID) | ORAL | Status: DC

## 2023-04-05 MED ORDER — SODIUM BICARBONATE 8.4 % IV SOLN
100.0000 meq | Freq: Once | INTRAVENOUS | Status: AC
  Administered 2023-04-05: 100 meq via INTRAVENOUS

## 2023-04-05 MED ORDER — LACTATED RINGERS IV BOLUS
1000.0000 mL | Freq: Once | INTRAVENOUS | Status: AC
  Administered 2023-04-05: 1000 mL via INTRAVENOUS

## 2023-04-05 MED ORDER — PRISMASOL BGK 0/2.5 32-2.5 MEQ/L EC SOLN
Status: DC
  Filled 2023-04-05 (×2): qty 5000

## 2023-04-05 MED ORDER — SODIUM BICARBONATE 8.4 % IV SOLN
100.0000 meq | Freq: Once | INTRAVENOUS | Status: AC
  Administered 2023-04-05: 100 meq via INTRAVENOUS
  Filled 2023-04-05: qty 100

## 2023-04-05 MED ORDER — DEXTROSE 50 % IV SOLN
INTRAVENOUS | Status: AC
  Filled 2023-04-05: qty 50

## 2023-04-05 MED ORDER — CALCIUM CHLORIDE 10 % IV SOLN
INTRAVENOUS | Status: AC
  Filled 2023-04-05: qty 10

## 2023-04-05 MED ORDER — FENTANYL CITRATE PF 50 MCG/ML IJ SOSY
25.0000 ug | PREFILLED_SYRINGE | INTRAMUSCULAR | Status: DC | PRN
  Administered 2023-04-05: 100 ug via INTRAVENOUS
  Filled 2023-04-05: qty 2

## 2023-04-05 MED ORDER — SODIUM BICARBONATE 8.4 % IV SOLN
INTRAVENOUS | Status: AC
  Filled 2023-04-05: qty 100

## 2023-04-05 MED ORDER — STERILE WATER FOR INJECTION IV SOLN
INTRAVENOUS | Status: DC
  Filled 2023-04-05 (×2): qty 150

## 2023-04-05 MED ORDER — BUDESONIDE 0.5 MG/2ML IN SUSP
0.5000 mg | Freq: Two times a day (BID) | RESPIRATORY_TRACT | Status: DC
  Administered 2023-04-05: 0.5 mg via RESPIRATORY_TRACT
  Filled 2023-04-05: qty 2

## 2023-04-05 MED ORDER — SODIUM CHLORIDE 0.9% IV SOLUTION: Freq: Once | INTRAVENOUS | Status: AC

## 2023-04-05 MED ORDER — SODIUM BICARBONATE 8.4 % IV SOLN
INTRAVENOUS | Status: DC
  Filled 2023-04-05: qty 1000

## 2023-04-05 MED ORDER — NOREPINEPHRINE 4 MG/250ML-% IV SOLN
0.0000 ug/min | INTRAVENOUS | Status: DC
  Administered 2023-04-05: 26 ug/min via INTRAVENOUS
  Administered 2023-04-05: 40 ug/min via INTRAVENOUS
  Administered 2023-04-05 (×2): 25 ug/min via INTRAVENOUS
  Administered 2023-04-05: 40 ug/min via INTRAVENOUS
  Filled 2023-04-05 (×4): qty 250

## 2023-04-05 MED ORDER — VASOPRESSIN 20 UNITS/100 ML INFUSION FOR SHOCK
0.0000 [IU]/min | INTRAVENOUS | Status: DC
  Administered 2023-04-05: 0.04 [IU]/min via INTRAVENOUS
  Administered 2023-04-05: 0.03 [IU]/min via INTRAVENOUS
  Filled 2023-04-05 (×2): qty 100

## 2023-04-05 MED ORDER — GLYCOPYRROLATE 1 MG PO TABS: 1.0000 mg | ORAL_TABLET | ORAL | Status: DC | PRN

## 2023-04-05 MED ORDER — SODIUM CHLORIDE 0.9 % IV SOLN
250.0000 mL | INTRAVENOUS | Status: DC
  Administered 2023-04-05: 500 mL via INTRAVENOUS

## 2023-04-05 MED ORDER — ORAL CARE MOUTH RINSE: 15.0000 mL | OROMUCOSAL | Status: DC | PRN

## 2023-04-05 MED ORDER — FENTANYL CITRATE PF 50 MCG/ML IJ SOSY: 25.0000 ug | PREFILLED_SYRINGE | INTRAMUSCULAR | Status: DC | PRN

## 2023-04-05 MED ORDER — ORAL CARE MOUTH RINSE
15.0000 mL | OROMUCOSAL | Status: DC
  Administered 2023-04-05 (×4): 15 mL via OROMUCOSAL

## 2023-04-05 MED ORDER — SODIUM BICARBONATE 8.4 % IV SOLN
50.0000 meq | Freq: Once | INTRAVENOUS | Status: AC
  Administered 2023-04-05: 50 meq via INTRAVENOUS

## 2023-04-05 MED ORDER — CALCIUM CHLORIDE 10 % IV SOLN
1.0000 g | Freq: Once | INTRAVENOUS | Status: AC
  Administered 2023-04-05: 1 g via INTRAVENOUS

## 2023-04-05 MED ORDER — POLYVINYL ALCOHOL 1.4 % OP SOLN
1.0000 [drp] | Freq: Four times a day (QID) | OPHTHALMIC | Status: DC | PRN
  Filled 2023-04-05: qty 15

## 2023-04-05 MED ORDER — PANTOPRAZOLE SODIUM 40 MG IV SOLR
40.0000 mg | Freq: Two times a day (BID) | INTRAVENOUS | Status: DC
  Administered 2023-04-05: 40 mg via INTRAVENOUS
  Filled 2023-04-05: qty 10

## 2023-04-05 MED ORDER — ETOMIDATE 2 MG/ML IV SOLN
INTRAVENOUS | Status: AC
  Administered 2023-04-05 (×2): 10 mg
  Filled 2023-04-05: qty 10

## 2023-04-05 MED ORDER — SODIUM CHLORIDE 0.9 % IV SOLN
1.0000 g | Freq: Once | INTRAVENOUS | Status: DC
  Filled 2023-04-05: qty 10

## 2023-04-05 MED ORDER — CALCIUM GLUCONATE-NACL 2-0.675 GM/100ML-% IV SOLN
2.0000 g | Freq: Once | INTRAVENOUS | Status: AC
  Administered 2023-04-05: 2000 mg via INTRAVENOUS
  Filled 2023-04-05: qty 100

## 2023-04-05 MED ORDER — PHENYLEPHRINE 80 MCG/ML (10ML) SYRINGE FOR IV PUSH (FOR BLOOD PRESSURE SUPPORT)
PREFILLED_SYRINGE | INTRAVENOUS | Status: AC
  Administered 2023-04-05: 240 ug
  Filled 2023-04-05: qty 10

## 2023-04-05 MED ORDER — DOCUSATE SODIUM 50 MG/5ML PO LIQD: 100.0000 mg | Freq: Two times a day (BID) | ORAL | Status: DC

## 2023-04-05 MED ORDER — FENTANYL CITRATE PF 50 MCG/ML IJ SOSY
PREFILLED_SYRINGE | INTRAMUSCULAR | Status: AC
  Administered 2023-04-05: 100 ug
  Filled 2023-04-05: qty 2

## 2023-04-05 MED ORDER — ROCURONIUM BROMIDE 50 MG/5ML IV SOLN
50.0000 mg | Freq: Once | INTRAVENOUS | Status: AC
  Administered 2023-04-05: 50 mg via INTRAVENOUS
  Filled 2023-04-05: qty 5

## 2023-04-06 ENCOUNTER — Ambulatory Visit: Admit: 2023-04-06 | Discharge: 2023-04-07 | Payer: MEDICARE

## 2023-04-06 ENCOUNTER — Encounter: Admit: 2023-04-06 | Discharge: 2023-04-06 | Payer: MEDICARE

## 2023-04-06 DIAGNOSIS — I10 Essential (primary) hypertension: Secondary | ICD-10-CM

## 2023-04-06 LAB — TYPE AND SCREEN: ABO/RH(D): A NEG

## 2023-04-06 LAB — BPAM RBC: ISSUE DATE / TIME: 202406061026

## 2023-04-06 NOTE — Progress Notes
A follow-up comprehensive medication management visit was completed today via telephone.    Referral reason: Hypertension Management and [facility]  Referring provider: Dr. Sherlyn Hay, MD    [facility] enrollment date: 10/02/2022  [facility] study end date: 10/02/2024  Study coordinator: Shelbie Ammons    Assessment & Plan     Hypertension  Patient's blood pressure is controlled per Rhonda Fletcher report and clinic readings. They have had a microalbumin completed. They have normal-mild albuminuria present. Their goal blood pressure is < 130/80 mmHg based on 2018 ACC/AHA guidelines.    Last clinic BP reading on 3/29: 127/67 mmHg    [facility] Home Blood Pressure Readings:  7 day average: 128/84 mmHg, HR: 73  1 month average:  142/86 mmHg, HR: 73 --> 140/88 mmHg, HR: 73    Rhonda Fletcher's blood pressures over the past week are controlled to goal. She notes re-starting irbesartan about two weeks ago.  She notes she has continued going to the gym. Encouraged patient to continue with exercise! Due to blood pressures trending down to within goal we will continue current BP regimen.    Plan  CONTINUE:  Irbesartan 150 mg by mouth daily (started around 5/24)  - ordered BMP for July for safety  - No renal dose adjustment needed     Checking blood pressure in the morning and evening every other day (around 3-4 times per week)    Amlodipine 10mg  daily in the morning     Carvedilol 25mg  po twice daily with breakfast and dinner     Ensure resting 5-10 minutes before blood pressure reading     Limiting foods high in salt such as fried chicken (has about 500 mg of sodium per 3 oz serving)    Going to the gym about 3-4 days per week for about 6-8 hours per week    Future considerations:  Patient does report she has clonidine patches at home. However, she does not wish to re-start these at this time  Initiating therapy such as chlorthalidone. Discussed with Dr. Philomena Course who agrees with this plan if needed and ensuring follow-up labs. Appreciate Dr. Vanice Sarah input!      Follow-up  The patient will continue to follow up with the pharmacist. Return to pharmacist in 3 weeks via telephone. The return visit was scheduled during today's visit.    Subjective & Objective      Rhonda Fletcher was referred to the pharmacy team for hypertension management. She does report dizziness all the time. Rhonda Fletcher reports ensuring she is resting prior to taking blood pressure. Patient does note her neighbor contributes to a higher stress level. She has eliminated foods like fried chicken that tend to have a higher sodium amount from her diet.    Patient reports she did try a couple clonidine patches for a couple days and they made her feel sluggish so she has not been utilizing them for blood pressure control. She does not recall what her blood pressures were reading while utilizing the clonidine patches.     Hypertension    HPI  Current hypertension medication regimen:   Irbesartan to 150 mg daily  Amlodipine 10mg  daily (maximum dose)  Carvedilol 25mg  po twice daily (maximum dose)  Previous medications for hypertension: Hctz (nausea/vomiting), spironolactone (nausea/vomiting), hydralazine (burned stomach), clonidine (felt sluggish)    The baseline blood pressure was 143/75 on 08/31/2022.    Lifestyle  Physical activity/exercise: yes. Tries to go to the gym. Wants to go to the gym 3-4  times per week in the new year.  Adds salt to food: no. sometimes add seasoning salt  Caffeine consumption: yes. 1-2 cups of coffee each morning  Elevated stress: yes. neighbor does cause stress  Alcohol use: yes. 1 glass of wine every once in a while  Tobacco use: no    Monitoring  Patient instructed to check blood pressure 3 time(s) per week.  Cuff type: Qardio  Appropriate technique: yes    Symptom Review  Hypotension: no  Chest pain: no  Shortness of breath: no  Lower extremity edema: no. High blood pressure symptoms include lightheadedness, headache, change in vision    Comorbidities  ASCVD condition(s): none  CKD: yes  Diabetes: yes (type 2 and uncontrolled)  Dyslipidemia: yes    Health Maintenance  Aspirin utilization: Patient is not taking aspirin due to the following reason(s): not indicated.  Statin utilization: Patient is not taking a statin.    [facility] Blood Pressure Readings:          Primary Care - Labs  BP Readings from Last 3 Encounters:   01/26/23 127/67   12/26/22 (!) 154/86   08/31/22 (!) 143/75     Pulse Readings from Last 3 Encounters:   01/26/23 72   12/26/22 77   08/31/22 63     Comprehensive Metabolic Profile    Lab Results   Component Value Date/Time    NA 136 (L) 01/26/2023 09:30 AM    K 4.6 01/26/2023 09:30 AM    CL 104 01/26/2023 09:30 AM    CO2 19 (L) 01/26/2023 09:30 AM    GAP 13 (H) 01/26/2023 09:30 AM    BUN 25 01/26/2023 09:30 AM    CR 1.90 (H) 01/26/2023 09:30 AM    GLU 312 (H) 01/26/2023 09:30 AM    GLU 146 (H) 02/21/2018 09:45 AM    Lab Results   Component Value Date/Time    CA 9.5 01/26/2023 09:30 AM    PO4 3.3 01/24/2023 08:44 AM    ALBUMIN 4.1 07/25/2022 12:07 PM    TOTPROT 7.4 07/25/2022 12:07 PM    ALKPHOS 82 07/25/2022 12:07 PM    AST 13 07/25/2022 12:07 PM    ALT 12 07/25/2022 12:07 PM    TOTBILI 0.5 07/25/2022 12:07 PM    GFR 33 (L) 08/24/2020 09:30 AM    GFRAA 40 (L) 08/24/2020 09:30 AM        eGFR   Date Value Ref Range Status   01/26/2023 29 (L) >60 mL/min Final     Comment:     eGFR calculated using the CKD-EPIcr_R equation     Lab Results   Component Value Date/Time    URMALBCRRAT 30.53 (H) 01/24/2023 08:49 AM     BMI Readings from Last 1 Encounters:   01/26/23 27.99 kg/m?     The 10-year ASCVD risk score (Arnett DK, et al., 2019) is: 24%    Values used to calculate the score:      Age: 67 years      Sex: Female      Is Non-Hispanic African American: Yes      Diabetic: Yes      Tobacco smoker: No      Systolic Blood Pressure: 127 mmHg      Is BP treated: Yes      HDL Cholesterol: 97 MG/DL      Total Cholesterol: 232 MG/DL     Medication History  Medication history was not completed because previously completed (follow-up visit).  Adverse Drug Reactions    Side effect(s) were not reported.    Refills needed: no    Home Medications    Medication Sig   ACCU-CHEK GUIDE TEST STRIPS test strip TEST BLOOD SUGAR AS DIRECTED DAILY BEFORE BREAKFAST.   albuterol sulfate (PROAIR HFA) 90 mcg/actuation HFA aerosol inhaler Inhale two puffs by mouth into the lungs every 6 hours as needed.   alcohol swabs (DROPSAFE ALCOHOL PREP PADS) USE TO CLEAN SKIN BEFORE FINGERSTICK   amLODIPine (NORVASC) 10 mg tablet TAKE 1 TABLET EVERY DAY   azelastine (ASTELIN) 137 mcg (0.1 %) nasal spray Apply two sprays to each nostril as directed twice daily.   betamethasone valerate (VALISONE) 0.1 % topical cream APPLY TOPICALLY TO AFFECTED AREA DAILY   Blood Glucose Control, Normal soln Use in glucose meter, Accu-check Aviva   Blood-Glucose Meter kit Use as directed to check sugar once a day.  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   carvediloL (COREG) 25 mg tablet TAKE ONE TABLET BY MOUTH TWICE DAILY WITH MEALS. TAKE WITH FOOD.   cetirizine (ZYRTEC) 10 mg tablet Take one tablet by mouth daily.     cholecalciferol(+) (VITAMIN D-3) 2,000 unit tablet Take 1 Tab by mouth daily.  Patient taking differently: Take one tablet by mouth daily. 5,000 units daily   cyanocobalamin (vitamin B-12) 5,000 mcg cap Take 1 tablet by mouth daily.   cyclobenzaprine (FLEXERIL) 5 mg tablet TAKE 1 TABLET AT BEDTIME   ezetimibe (ZETIA) 10 mg tablet Take one tablet by mouth daily.   famotidine (PEPCID) 20 mg tablet TAKE 1 TABLET TWICE DAILY   ferrous sulfate (FEOSOL) 325 mg (65 mg iron) tablet Take one tablet by mouth every 48 hours.   fluconazole (DIFLUCAN) 150 mg tablet Take one tablet by mouth daily.   fluticasone propionate (FLONASE) 50 mcg/actuation nasal spray, suspension USE 2 SPRAYS IN EACH NOSTRIL AS DIRECTED DAILY. SHAKE BOTTLE GENTLY BEFORE USING   glimepiride (AMARYL) 4 mg tablet Take one tablet by mouth every morning.   irbesartan (AVAPRO) 150 mg tablet Take one tablet by mouth at bedtime daily.   lancets MISC Use one each as directed daily before breakfast. Diag: E11.29   mirtazapine (REMERON) 15 mg tablet TAKE ONE TABLET BY MOUTH AT BEDTIME DAILY.   pantoprazole DR (PROTONIX) 40 mg tablet Take one tablet by mouth daily.   pioglitazone (ACTOS) 45 mg tablet Take one tablet by mouth daily.   sodium bicarbonate 650 mg tablet Take one tablet by mouth twice daily.      Education  Education provided: yes    Hypertension  Education components: BP goal and disease risk factors and encouraged patient to monitor BP at home    Time spent with patient: 20 minutes    Thank you for allowing me to participate in the care of your patient,  Lynelle Smoke, [facility]  [facility] Clinical Pharmacist

## 2023-04-07 LAB — BPAM RBC
Blood Product Expiration Date: 202406212359
Unit Type and Rh: 600

## 2023-04-07 LAB — TYPE AND SCREEN
Antibody Screen: NEGATIVE
Unit division: 0

## 2023-04-12 ENCOUNTER — Inpatient Hospital Stay: Payer: PRIVATE HEALTH INSURANCE | Admitting: Internal Medicine

## 2023-04-12 ENCOUNTER — Inpatient Hospital Stay: Payer: Self-pay | Admitting: Internal Medicine

## 2023-04-30 NOTE — Progress Notes (Signed)
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 -TDC placement 03/20/2023 - CLIP to AF MWF 1145 (arrive 1115am 1st day) - BCF by Dr. Chestine Spore on 5/15 -> positive thrill -She was receiving dialysis MWF schedule with some complication including intradialytic hypotension. -Started CRRT on 04/03/2023 for fluid overload/respiratory distress.  Patient is now intubated and becoming very acidotic.  I am going to change bath to sodium bicarbonate.  Overall prognosis looks poor.  She is DNR.  Discussed with her husband as well.  # Hyperkalemia: Managing with CRRT, sodium bicarbonate.  # Anemia: Continue ESA  Required blood transfusion.Iron was OK.  Monitor and transfuse as needed.   # CKD-MBD/secondary hyperparathyroidism: Continue calcitriol and monitor lab.   # Shock/volume: Blood pressure is very low therefore on Levophed and vasopressin this morning.  # Acute hypoxic respiratory failure: Worsening therefore intubated this morning.  UF as tolerated.  Per critical care team.   # Osteomyelitis, seen by ID and switched antibiotic to vancomycin.   # Acute febrile illness: Chest x-ray negative.  On antibiotics for OM.  Blood culture negative so far.  Catheter site clean.  Afebrile now.  # Acute metabolic/toxic encephalopathy: Received Narcan and was moved to ICU on 6/3.  She is now intubated and sedated.  Subjective: Seen and examined in ICU.  Event noted.  Blood pressure worsened therefore on Levophed and vasopressin.  Respiratory status declined therefore intubated.  Patient chest pain was present at the bedside.  Discussed with the team. Objective Vital signs in last 24 hours: Vitals:   04/22/2023 0700 04/21/2023 0715 04/14/2023 0730 04/06/2023 0810  BP: (!) 99/43 (!) 94/46    Pulse:    (!) 119  Resp: (!) 24 20  18   Temp:   (!) 96.5 F (35.8 C)   TempSrc:   Axillary   SpO2:    100%  Weight:      Height:       Weight change:  -1.66 kg  Intake/Output Summary (Last 24 hours) at 03/31/2023 0940 Last data filed at 04/19/2023 0700 Gross per 24 hour  Intake 1562.51 ml  Output 2808 ml  Net -1245.49 ml        Labs: RENAL PANEL Recent Labs  Lab 04/03/23 0039 04/03/23 1622 04/03/23 2027 04/04/23 0122 04/04/23 1836 04/28/2023 0511 04/29/2023 0547 04/20/2023 0928  NA 135 133*   < > 133* 131* 131* 135 133*  K 3.8 4.1   < > 4.2 4.0 5.5* 6.1* 6.7*  CL 93* 95*  --  94* 96* 98  --   --   CO2 22 18*  --  20* 21* 13*  --   --   GLUCOSE 131* 132*  --  156* 259* 221*  --   --   BUN 43* 55*  --  42* 31* 30*  --   --   CREATININE 4.09* 4.02*  --  2.90* 1.76* 1.80*  --   --   CALCIUM 8.6* 8.5*  --  8.8* 8.6* 8.8*  --   --   MG  --   --   --  2.1 2.4 2.7*  --   --   PHOS 3.8 3.1  --  2.4* 2.7 3.2  --   --   ALBUMIN 3.0* 2.7*  --  2.7* 2.6* 2.4*  --   --    < > = values in this interval not displayed.     Liver Function Tests:  Recent Labs  Lab 03/30/23 0834 03/31/23 0056 04/04/23 0122 04/04/23 1836 04/24/2023 0511  AST 24  --   --   --   --   ALT 18  --   --   --   --   ALKPHOS 121  --   --   --   --   BILITOT 2.1*  --   --   --   --   PROT 7.2  --   --   --   --   ALBUMIN 3.2*   < > 2.7* 2.6* 2.4*   < > = values in this interval not displayed.    No results for input(s): "LIPASE", "AMYLASE" in the last 168 hours. No results for input(s): "AMMONIA" in the last 168 hours. CBC: Recent Labs    07/26/22 1258 08/09/22 1343 09/06/22 1325 09/06/22 1326 09/07/22 1433 10/10/22 1308 11/08/22 1243 11/15/22 1334 12/06/22 1326 12/06/22 1327 12/13/22 1424 01/12/23 1419 01/28/23 1539 03/02/23 0402 03/02/23 1558 04/03/23 0039 04/03/23 2027 04/28/2023 0547 04/29/2023 0847 04/20/2023 0928  HGB 8.0*   < > 10.0*  --  10.5*   < > 9.9*   < > 8.9*  --    < > 8.3*   < > 7.6*   < > 7.6* 8.5* 10.5* 8.4* 9.9*  MCV 90.7   < > 88.7  --  86.7   < > 91.2   < > 95.5  --    < > 93.3   < > 91.5   < > 92.5  --   --  100.3*  --    VITAMINB12  --   --   --   --  273  --   --   --   --   --   --   --   --   --   --   --   --   --   --   --   FERRITIN 394*  --   --  401*  --   --  258  --   --  209  --  277  --   --   --   --   --   --   --   --   TIBC 232*  --  258  --   --   --  256  --  269  --   --  263  --  217*  --   --   --   --   --   --   IRON 64  --  69  --   --   --  53  --  46  --   --  32  --  70  --   --   --   --   --   --   RETICCTPCT 2.2  --   --  1.4  --   --  2.1  --   --  2.1  --  2.6  --   --   --   --   --   --   --   --    < > = values in this interval not displayed.     Cardiac Enzymes: No results for input(s): "CKTOTAL", "CKMB", "CKMBINDEX", "TROPONINI" in the last 168 hours.  CBG: Recent Labs  Lab 04/04/23 1106 04/04/23 1506 04/04/23 1923 04/12/2023 0006 04/29/2023 0822  GLUCAP 174* 174* 229* 210* 100*     Iron Studies: No results  for input(s): "IRON", "TIBC", "TRANSFERRIN", "FERRITIN" in the last 72 hours. Studies/Results: Portable Chest x-ray  Result Date: 04/20/2023 CLINICAL DATA:  Endotracheal tube placement EXAM: PORTABLE CHEST - 1 VIEW COMPARISON:  03/30/2023 FINDINGS: Unchanged borderline cardiomegaly. Mild pulmonary vascular congestion appears progressed since prior examination. Evaluation of the left lung limited due to overlying support devices. Patchy opacities at the right lung base have increased since prior examination and may be due to atelectasis or developing pneumonia. Long segment fusion of the thoracolumbar spine again partially visualized. Left IJ tunneled hemodialysis catheter is unchanged in position. Newly placed ET tube terminates proximally 1.7 cm above the carina. There appears to be 2 nasogastric tubes 1 of which terminates in the distal stomach/proximal duodenal. The distal tip of the thinner line is not included on the exam. IMPRESSION: 1. Interval worsening of pulmonary vascular congestion and right basilar airspace disease. 2. Lines and tubes as above.  Electronically Signed   By: Acquanetta Belling M.D.   On: 04/12/2023 08:55   DG Abd Portable 1V  Result Date: 04/04/2023 CLINICAL DATA:  Feeding tube placement. EXAM: PORTABLE ABDOMEN - 1 VIEW COMPARISON:  None Available. FINDINGS: Tip of the weighted enteric tube in the right upper quadrant in the region of the distal stomach/proximal duodenum. No bowel dilatation in the included upper abdomen. IMPRESSION: Tip of the weighted enteric tube in the right upper quadrant in the region of the distal stomach/proximal duodenum. Electronically Signed   By: Narda Rutherford M.D.   On: 04/04/2023 09:58    Medications: Infusions:  sodium chloride Stopped (04/04/23 1930)   sodium chloride     sodium chloride     sodium chloride 500 mL (04/07/2023 0938)   feeding supplement (PIVOT 1.5 CAL) 20 mL/hr at 04/19/2023 0700   norepinephrine (LEVOPHED) Adult infusion 25 mcg/min (04/23/2023 0816)   prismasol BGK 2/2.5 dialysis solution     prismasol BGK 2/2.5 replacement solution     prismasol BGK 2/2.5 replacement solution     prothrombin complex conc human (KCENTRA) IVPB 2,680 Units 2,680 Units (04/15/2023 4132)   vancomycin Stopped (04/04/23 0759)   vasopressin 0.03 Units/min (04/21/2023 0841)    Scheduled Medications:  arformoterol  15 mcg Nebulization BID   budesonide (PULMICORT) nebulizer solution  0.5 mg Nebulization BID   Chlorhexidine Gluconate Cloth  6 each Topical Q0600   darbepoetin (ARANESP) injection - NON-DIALYSIS  100 mcg Subcutaneous Q Fri-1800   diclofenac  1 patch Transdermal BID   insulin aspart  0-15 Units Subcutaneous Q4H   multivitamin  1 tablet Per Tube QHS   mouth rinse  15 mL Mouth Rinse Q2H   pantoprazole (PROTONIX) IV  40 mg Intravenous Q12H   polyethylene glycol  17 g Per Tube BID   rocuronium  50 mg Intravenous Once   senna-docusate  1 tablet Per Tube BID   sodium chloride flush  10-40 mL Intracatheter Q12H   sodium chloride flush  3 mL Intravenous Q12H    have reviewed scheduled and prn  medications.  Physical Exam: General: Critically ill looking female, sedated, intubated.[name] Kitchen Heart:RRR, s1s2 nl Lungs: Coarse breath sound bilateral. Abdomen:soft, abdomen distended Extremities: Trace LE edema present Dialysis Access: Left IJ in place, L BCF + T/B   [place] Silveria Prasad Dallas Scorsone 04/29/2023,9:40 AM  LOS: 37 days

## 2023-04-30 NOTE — Progress Notes (Signed)
OT Cancellation Note  Patient Details Name: Breeana Fugua MRN: [id number] DOB: [birth year]   Cancelled Treatment:    Reason Eval/Treat Not Completed: Medical issues which prohibited therapy (Pt on pressors and BP remaining soft at this time. RN requesting hold. Will return as schedule allows and pt medically appropriate.)  Tyler Deis, OTR/L [facility] Acute Rehabilitation Office: [phone]  Myrla Halsted 04/16/2023, 7:18 AM

## 2023-04-30 NOTE — Procedures (Signed)
Central Venous Catheter Insertion Procedure Note  [name]  [id number]  07-Feb-1956  Date:04/01/2023  Time:9:29 AM   Provider Performing:Brooke Lodema Hong   Procedure: Insertion of Non-tunneled Central Venous Catheter(36556) with US guidance (82956)   Indication(s) Medication administration  Consent Unable to obtain consent due to emergent nature of procedure. Husband arrived and verbally consented.   Anesthesia Topical only with 1% lidocaine   Timeout Verified patient identification, verified procedure, site/side was marked, verified correct patient position, special equipment/implants available, medications/allergies/relevant history reviewed, required imaging and test results available.  Sterile Technique Maximal sterile technique including full sterile barrier drape, hand hygiene, sterile gown, sterile gloves, mask, hair covering, sterile ultrasound probe cover (if used).  Procedure Description Area of catheter insertion was cleaned with chlorhexidine and draped in sterile fashion.  With real-time ultrasound guidance a central venous catheter was placed into the right femoral vein. Nonpulsatile blood flow and easy flushing noted in all ports.  The catheter was sutured in place and sterile dressing applied.    Complications/Tolerance None; patient tolerated the procedure well. Chest x-ray is not ordered for femoral cannulation.  EBL Minimal  Specimen(s) None      Posey Boyer, MSN, AG-ACNP-BC [facility] 04/25/2023, 9:30 AM  See Amion for pager If no response to pager, please call PCCM consult pager After 7:00 pm call Elink

## 2023-04-30 NOTE — Discharge Summary (Signed)
   DEATH SUMMARY   Patient Details  Name: [name] MRN: [id number] DOB: 1956/02/20 WUJ:[id number] Chase, Grayling Congress, DO  Admission/Discharge Information   Admit Date:  03-11-23  Date of Death:  2023-04-18  Time of Death:  20-Apr-1801   Length of Stay: 2036-04-20   Principle Cause of death: Septic shock MRSE thoracolumbar osteomyelitis  Hospital Diagnoses: 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)   IDA (iron deficiency anemia)   PTSD (post-traumatic stress disorder)   Hypertension   High cholesterol   GERD (gastroesophageal reflux disease)   Fibromyalgia   Depression   Asthma   Anxiety   Anemia   Hypothyroidism   CKD (chronic kidney disease) stage 5, GFR less than 15 ml/min (HCC)   Osteomyelitis (HCC)   Chronic diastolic CHF (congestive heart failure) (HCC)   Thrombocytopenia (HCC)   ESRD on dialysis (HCC)   Fever   Chronic thoracic back pain   Acute encephalopathy   Malnutrition of moderate degree   Hospital Course: 67 year old woman with ESRD and MRSE thoracolumbar osteomyelitis, admitted 4/2 9, transferred to ICU 6/2 for accidental opiate overdose, some response to Narcan and required BiPAP.  CRRT was started 6/4 Her mental status improved but she required low-dose pressors.  She was on daptomycin but started on vancomycin on 6/3 by ID She was found to have C. difficile antigen positive but toxin  negative 03-11-2023 admitted to the hospital.  MRI controllable Looking for vertebral body, new marrow edema T3.  7 mm epidural fluid collection. 5/2 nephrology was consulted for AKI on CKD 5. 5/3 underwent IR guided T5 bone biopsy and T4-T5 disc aspiration.  IR also placed temp HD catheter for HD. 5/12 vascular surgery was consulted. 5/13 left TDC placement with vascular surgery. 5/14 palliative care consulted for pain management 5/15 underwent aVF placement left brachial cephalic. Had a Fall in the OR while being transferred from the OR table. 5/17 Accepted at [facility] SW GBO TTS pending pt's ability to sit for extended period of time. 5/18 1 PRBC transfusion 6/2 transferred to 79M, placed on BIPAP, improvement in AMS 6/4 started CRRT 6/5 Cortrack placed 6/6 refractory shock, intubated, femoral CVL placed, transfused 1 U PRBC, bicarb gtt ,hyperkalemia treated ,eventually transitioned to comfort care    Procedures: ETT Femoral CVL  Consultations: Nephrology ID Palliative care   Signed: Comer Locket. Vassie Loll, MD Apr 18, 2023

## 2023-04-30 NOTE — Progress Notes (Signed)
Assessed for uspiv for vasopressor; Pt L arm restricted. A-line on the R arm and uspiv w/ vasopressor infusing, w/ IV watch in place. Informed Rn of assessment ,recommend central access due to limited venous access.

## 2023-04-30 NOTE — Progress Notes (Signed)
eLink Physician-Brief Progress Note Patient Name: [name] [name] DOB: 1955/11/26 MRN: [id number]   Date of Service  04/19/2023  HPI/Events of Note  Per pharmacy recommendation Levo ceiling increased to 15 and Phenylephrine gtt discontinued.  eICU Interventions  See above.        [name] [name] 04/21/2023, 6:25 AM

## 2023-04-30 NOTE — Progress Notes (Signed)
Repeat ABG critical results given to CCM.

## 2023-04-30 NOTE — Progress Notes (Signed)
Critical ABG results were given to CCM. Verbal order was given from CCM to RT to increase RR.

## 2023-04-30 NOTE — Progress Notes (Signed)
  Pt with continued decompensation throughout the day despite ongoing efforts. SBP currently in the 40's on aline, HR 60's.  She remains DNR.  Patient is actively dying.  Husband at bedside.  Ongoing emotional support provided.  Husband asked if we could remove "all that stuff" (ETT/ vent, etc).    - comfort care and one way extubation orders placed - ongoing family support - anticipate hospital passing imminently          Posey Boyer, MSN, AG-ACNP-BC [facility] [facility] 04/20/2023, 5:44 PM  See Amion for pager If no response to pager, please call PCCM consult pager After 7:00 pm call Elink

## 2023-04-30 NOTE — Procedures (Signed)
Intubation Procedure Note  [name] [name]  [id number]  12-11-1955  Date:04/24/2023  Time:8:38 AM   Provider Performing:Shawna Wearing V. Mahiya Kercheval    Procedure: Intubation (31500)  Indication(s) Respiratory Failure  Consent Unable to obtain consent due to emergent nature of procedure.   Anesthesia Etomidate, Fentanyl, and Rocuronium   Time Out Verified patient identification, verified procedure, site/side was marked, verified correct patient position, special equipment/implants available, medications/allergies/relevant history reviewed, required imaging and test results available.   Sterile Technique Usual hand hygeine, masks, and gloves were used   Procedure Description Patient positioned in bed supine.  Sedation given as noted above.  Patient was intubated with endotracheal tube using Glidescope.  View was Grade 1 full glottis .  Number of attempts was 1.  Colorimetric CO2 detector was consistent with tracheal placement.   Complications/Tolerance None; patient tolerated the procedure well. Chest X-ray is ordered to verify placement.   EBL Minimal   Specimen(s) None   [name] [name] V. Vassie Loll MD
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
## 2023-04-30 NOTE — Progress Notes (Signed)
67 year old woman with ESRD and MRSE thoracolumbar osteomyelitis, admitted 4/2 9, transferred to ICU 6/2 for accidental opiate overdose, some response to Narcan and required BiPAP.  CRRT was started 6/4 Her mental status improved but she required low-dose pressors.  She was on daptomycin but started on vancomycin on 6/3 by ID She was found to have C. difficile antigen positive but toxin negative  I was called emergently to the bedside for hypotension this morning Unresponsive on BiPAP, sinus with frequent APCs and bigeminy pattern, narrow complex on monitor.  She was given bicarbonate and calcium chloride, 1 L fluid bolus and Levophed was increased to 25 mics with improvement in blood pressure. She was then emergently intubated for airway protection Coffee-ground return was noted from OG tube, she was started on Protonix and emergently transfused 1 unit PRBC Bedside echo showed good LV function and slightly dilated RV. Repeat labs showed potassium of 6.6, CRRT was restarted. ABG showed a combination of metabolic and severe respiratory acidosis. By noon she was starting to get more responsive. On exam-pupils right 4 mm, left 3 mm, bilateral air entry, no accessory muscle use, S1-S2 tacky, soft nontender abdomen mild distended, 1+ edema.  Chest x-ray postintubation shows ET tube above carina, OG tube in position, no new infiltrates or effusions  Labs show elevated liver enzymes, severe leukocytosis, INR 3.4. Hemoglobin 8.4.  Impression/plan  Shock -hemorrhagic versus septic, likely cardiogenic, troponins low and LV appears okay, no reason to suspect PE, she has been on Eliquis -Continue pressors, maintain MAP 65 and above or systolic blood pressure 90 or above -Reversed Eliquis with Kcentra, transfused 1 unit PRBC, serial hemoglobin check, consult GI  Acute respiratory failure -intubated for airway protection, vent settings reviewed and adjusted, increased respiratory rate for  acidosis.  Hyperkalemia Acute metabolic and respiratory acidosis -Trying to correct with CRRT, increased respiratory rate  Husband updated at bedside, no CPR but continue all other resuscitative efforts.  My independent critical care time was 65 minutes independent of procedures  Samariah Hokenson V. Vassie Loll MD

## 2023-04-30 NOTE — Progress Notes (Signed)
PT Cancellation Note  Patient Details Name: Lastarr Dazey MRN: [id number] DOB: [birth year]   Cancelled Treatment:    Reason Eval/Treat Not Completed: Medical issues which prohibited therapy (Pt was intubated this am.  Nurse asked PT to HOLD today.)   Bevelyn Buckles 04/18/2023, 8:43 AM Janilah Hojnacki M,PT Acute Rehab Services [phone]

## 2023-04-30 NOTE — Progress Notes (Signed)
Brief Nutrition Support Note  Discussed pt with RN and during ICU rounds. This morning, pt with hypotension and became unresponsive on BiPAP. Pt required emergent intubation for airway protection and OG tube was placed to LIWS with coffee-ground output. Tube feeds via Cortrak were held. CRRT is ongoing.  Per discussion with CCM, continue to hold tube feeds due to new shock and pressor requirements. RD will discontinue tube feeding orders. Will monitor for ability to resume enteral nutrition.  RD will continue to follow pt during admission.   Mertie Clause, MS, RD, LDN Inpatient Clinical Dietitian Please see AMiON for contact information.

## 2023-04-30 NOTE — Progress Notes (Signed)
Per CCM order, RT pulled pt's ETT back 1cm. ETT is now 22 at the lip.

## 2023-04-30 NOTE — Progress Notes (Signed)
Blood pressures seem to rally in the morning but by afternoon ripped out again. Treated again with bicarbonate and calcium with improvement for a few minutes but then back to hypotensive again Blood pressure is too low now to continue CRRT and can discontinue. Repeat ABG shows pH of 7.18 , hemoglobin is improved from 8.4-9.9 , troponins are low  Updated husband that she has refractory shock not responding to therapies.  No CPR no cardioversion has been initiated already.  He wanted to continue current forms of life support with the understanding that she may die on the ventilator.  Will cap Levophed 40 mics and vasopressin at 0.04  My additional critical  care time was 30 minutes  Marysa Wessner V. Vassie Loll MD

## 2023-04-30 NOTE — Progress Notes (Signed)
Critical ABG results given to Dr. Vassie Loll.

## 2023-04-30 NOTE — Progress Notes (Signed)
Pt extubated to comfort care.  

## 2023-04-30 NOTE — Progress Notes (Signed)
eLink Physician-Brief Progress Note Patient Name: [name] [name] DOB: 08-27-1956 MRN: [id number]   Date of Service  04/28/2023  HPI/Events of Note  Patient with persisting hypotension  on peripheral Levo + Midodrine.   eICU Interventions  Peripheral Phenylephrine gtt ordered as a temporizing measure, daytime PCCM attending physician to consider placing a central line so that Levo gtt can be titrated up beyond 10 mcg.        Thomasene Lot Charne Mcbrien 04/28/2023, 5:46 AM

## 2023-04-30 DEATH — deceased

## 2023-05-08 ENCOUNTER — Ambulatory Visit: Admit: 2023-05-08 | Discharge: 2023-05-09 | Payer: MEDICARE

## 2023-05-08 ENCOUNTER — Encounter: Admit: 2023-05-08 | Discharge: 2023-05-08 | Payer: MEDICARE

## 2023-05-08 DIAGNOSIS — I10 Essential (primary) hypertension: Secondary | ICD-10-CM

## 2023-05-08 LAB — BASIC METABOLIC PANEL
ANION GAP: 9 mL/min (ref >60)
BLD UREA NITROGEN: 27 mg/dL — ABNORMAL HIGH (ref 7–25)
CALCIUM: 9.8 mg/dL (ref 8.5–10.6)
CHLORIDE: 108 MMOL/L (ref 98–110)
CO2: 22 MMOL/L (ref 21–30)
CREATININE: 1.5 mg/dL — ABNORMAL HIGH (ref 0.4–1.00)
EGFR: 38 mL/min — ABNORMAL LOW (ref >60)
GLUCOSE,PANEL: 119 mg/dL — ABNORMAL HIGH (ref 70–100)

## 2023-05-17 ENCOUNTER — Encounter: Admit: 2023-05-17 | Discharge: 2023-05-17 | Payer: MEDICARE

## 2023-05-17 MED ORDER — PIOGLITAZONE 45 MG PO TAB
45 mg | ORAL_TABLET | Freq: Every day | ORAL | 5 refills | 90.00000 days | Status: AC
Start: 2023-05-17 — End: ?

## 2023-05-18 ENCOUNTER — Ambulatory Visit: Admit: 2023-05-18 | Discharge: 2023-05-19 | Payer: MEDICARE

## 2023-05-18 DIAGNOSIS — I10 Essential (primary) hypertension: Secondary | ICD-10-CM

## 2023-05-18 MED ORDER — FAMOTIDINE 20 MG PO TAB
20 mg | ORAL_TABLET | Freq: Every day | ORAL | 1 refills | 90.00000 days | Status: AC
Start: 2023-05-18 — End: ?

## 2023-05-18 MED ORDER — IRBESARTAN 150 MG PO TAB
150 mg | ORAL_TABLET | Freq: Every evening | ORAL | 1 refills | Status: AC
Start: 2023-05-18 — End: ?

## 2023-05-18 NOTE — Progress Notes
A follow-up comprehensive medication management visit was completed today via telephone.    Referral reason: Hypertension Management and [facility]  Referring provider: Dr. Sherlyn Hay, MD    [name] enrollment date: 10/02/2022  [facility] study end date: 10/02/2024  Study coordinator: Rhonda Fletcher    Assessment & Plan     Hypertension  Patient's blood pressure is controlled per Rhonda Fletcher report and clinic readings. They have had a microalbumin completed. They have normal-mild albuminuria present. Their goal blood pressure is < 130/80 mmHg based on 2018 ACC/AHA guidelines.    Last clinic BP reading on 3/29: 127/67 mmHg    Qardio Home Blood Pressure Readings:  1 month average:  140/88 mmHg, HR: 73 --> 132/87 mmHg, HR: 72  3 month average: 137/87 mmHg, HR: 73    Rhonda Fletcher's blood pressures over the past month are controlled to goal.  She notes she has continued going to the gym. Encouraged patient to continue with exercise! A few elevated BP readings this past week that patient notes being from eating too much salt. She has thrown out this fried chicken and plans to resume normal diet. Therefore, we will not make any changes today.    Plan  CONTINUE:  Irbesartan 150 mg by mouth daily at night (started around 5/24)  - BMP 04/2023 was WNL after irbesartan re-initiation   - No renal dose adjustment needed     Checking blood pressure in the morning and evening every other day (around 3-4 times per week)    Amlodipine 10mg  daily in the morning     Carvedilol 25mg  po twice daily with breakfast and dinner     Ensure resting 5-10 minutes before blood pressure reading     Limiting foods high in salt such as fried chicken (has about 500 mg of sodium per 3 oz serving)    Going to the gym about 3-4 days per week for about 6-8 hours per week    MISC:  Patient requesting refill on famotidine to [employer]. Sending refill for 1 tablet daily based on last CrCl of 38.3 mL/min to Rhonda Fletcher for co-sign    Future considerations:  Patient does report she has clonidine patches at home. However, she does not wish to re-start these at this time  Initiating therapy such as chlorthalidone. Discussed with Rhonda Fletcher who agrees with this plan if needed and ensuring follow-up labs. Appreciate Rhonda Fletcher input!      Follow-up  The patient will continue to follow up with the pharmacist. Return to pharmacist in 6 weeks via telephone. The return visit was scheduled during today's visit.    Subjective & Objective      Rhonda Fletcher was referred to the pharmacy team for hypertension management. She does report dizziness all the time. Rhonda Fletcher reports ensuring she is resting prior to taking blood pressure. Patient does note her neighbor contributes to a higher stress level. She has eliminated foods like fried chicken that tend to have a higher sodium amount from her diet.    Patient reports she did try a couple clonidine patches for a couple days and they made her feel sluggish so she has not been utilizing them for blood pressure control. She does not recall what her blood pressures were reading while utilizing the clonidine patches.     Hypertension    HPI  Current hypertension medication regimen:   Irbesartan to 150 mg daily  Amlodipine 10mg  daily (maximum dose)  Carvedilol 25mg  po twice  daily (maximum dose)  Previous medications for hypertension: Hctz (nausea/vomiting), spironolactone (nausea/vomiting), hydralazine (burned stomach), clonidine (felt sluggish)    The baseline blood pressure was 143/75 on 08/31/2022.    Lifestyle  Physical activity/exercise: yes. Tries to go to the gym. Wants to go to the gym 3-4 times per week in the new year.  Adds salt to food: no. sometimes add seasoning salt  Caffeine consumption: yes. 1-2 cups of coffee each morning  Elevated stress: yes. neighbor does cause stress  Alcohol use: yes. 1 glass of wine every once in a while  Tobacco use: no    Monitoring  Patient instructed to check blood pressure 3 time(s) per week.  Cuff type: Qardio  Appropriate technique: yes    Symptom Review  Hypotension: no  Chest pain: no  Shortness of breath: no  Lower extremity edema: no. High blood pressure symptoms include lightheadedness, headache, change in vision    Comorbidities  ASCVD condition(s): none  CKD: yes  Diabetes: yes (type 2 and controlled)  Dyslipidemia: yes    Health Maintenance  Aspirin utilization: Patient is not taking aspirin due to the following reason(s): not indicated.  Statin utilization: Patient is not taking a statin.    Qardio Blood Pressure Readings:          Primary Care - Labs  BP Readings from Last 3 Encounters:   01/26/23 127/67   12/26/22 (!) 154/86   08/31/22 (!) 143/75     Pulse Readings from Last 3 Encounters:   01/26/23 72   12/26/22 77   08/31/22 63     Comprehensive Metabolic Profile    Lab Results   Component Value Date/Time    NA 139 05/08/2023 10:45 AM    K 4.7 05/08/2023 10:45 AM    CL 108 05/08/2023 10:45 AM    CO2 22 05/08/2023 10:45 AM    GAP 9 05/08/2023 10:45 AM    BUN 27 (H) 05/08/2023 10:45 AM    CR 1.51 (H) 05/08/2023 10:45 AM    GLU 119 (H) 05/08/2023 10:45 AM    GLU 146 (H) 02/21/2018 09:45 AM    Lab Results   Component Value Date/Time    CA 9.8 05/08/2023 10:45 AM    PO4 3.3 01/24/2023 08:44 AM    ALBUMIN 4.1 07/25/2022 12:07 PM    TOTPROT 7.4 07/25/2022 12:07 PM    ALKPHOS 82 07/25/2022 12:07 PM    AST 13 07/25/2022 12:07 PM    ALT 12 07/25/2022 12:07 PM    TOTBILI 0.5 07/25/2022 12:07 PM    GFR 33 (L) 08/24/2020 09:30 AM    GFRAA 40 (L) 08/24/2020 09:30 AM        eGFR   Date Value Ref Range Status   05/08/2023 38 (L) >60 mL/min Final     Comment:     eGFR calculated using the CKD-EPIcr_R equation     Lab Results   Component Value Date/Time    URMALBCRRAT 30.53 (H) 01/24/2023 08:49 AM     BMI Readings from Last 1 Encounters:   01/26/23 27.99 kg/m?     The 10-year ASCVD risk score (Arnett DK, et al., 2019) is: 24%    Values used to calculate the score:      Age: 14 years Sex: Female      Is Non-Hispanic African American: Yes      Diabetic: Yes      Tobacco smoker: No      Systolic Blood Pressure: 127 mmHg  Is BP treated: Yes      HDL Cholesterol: 97 MG/DL      Total Cholesterol: 232 MG/DL     Medication History  Medication history was not completed because previously completed (follow-up visit).    Adverse Drug Reactions    Side effect(s) were not reported.    Refills needed: yes  irbesartan and famotidine    Home Medications    Medication Sig   ACCU-CHEK GUIDE TEST STRIPS test strip TEST BLOOD SUGAR AS DIRECTED DAILY BEFORE BREAKFAST.   albuterol sulfate (PROAIR HFA) 90 mcg/actuation HFA aerosol inhaler Inhale two puffs by mouth into the lungs every 6 hours as needed.   alcohol swabs (DROPSAFE ALCOHOL PREP PADS) USE TO CLEAN SKIN BEFORE FINGERSTICK   amLODIPine (NORVASC) 10 mg tablet TAKE 1 TABLET EVERY DAY   azelastine (ASTELIN) 137 mcg (0.1 %) nasal spray Apply two sprays to each nostril as directed twice daily.   betamethasone valerate (VALISONE) 0.1 % topical cream APPLY TOPICALLY TO AFFECTED AREA DAILY   Blood Glucose Control, Normal soln Use in glucose meter, Accu-check Aviva   Blood-Glucose Meter kit Use as directed to check sugar once a day.  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   carvediloL (COREG) 25 mg tablet TAKE ONE TABLET BY MOUTH TWICE DAILY WITH MEALS. TAKE WITH FOOD.   cetirizine (ZYRTEC) 10 mg tablet Take one tablet by mouth daily.     cholecalciferol(+) (VITAMIN D-3) 2,000 unit tablet Take 1 Tab by mouth daily.  Patient taking differently: Take one tablet by mouth daily. 5,000 units daily   cyanocobalamin (vitamin B-12) 5,000 mcg cap Take 1 tablet by mouth daily.   cyclobenzaprine (FLEXERIL) 5 mg tablet TAKE 1 TABLET AT BEDTIME   ezetimibe (ZETIA) 10 mg tablet Take one tablet by mouth daily.   famotidine (PEPCID) 20 mg tablet Take one tablet by mouth daily.   ferrous sulfate (FEOSOL) 325 mg (65 mg iron) tablet Take one tablet by mouth every 48 hours.   fluconazole (DIFLUCAN) 150 mg tablet Take one tablet by mouth daily.   fluticasone propionate (FLONASE) 50 mcg/actuation nasal spray, suspension USE 2 SPRAYS IN EACH NOSTRIL AS DIRECTED DAILY. SHAKE BOTTLE GENTLY BEFORE USING   glimepiride (AMARYL) 4 mg tablet Take one tablet by mouth every morning.   irbesartan (AVAPRO) 150 mg tablet Take one tablet by mouth at bedtime daily.   lancets MISC Use one each as directed daily before breakfast. Diag: E11.29   mirtazapine (REMERON) 15 mg tablet TAKE ONE TABLET BY MOUTH AT BEDTIME DAILY.   pantoprazole DR (PROTONIX) 40 mg tablet Take one tablet by mouth daily.   pioglitazone (ACTOS) 45 mg tablet TAKE 1 TABLET EVERY DAY   sodium bicarbonate 650 mg tablet Take one tablet by mouth twice daily.      Education  Education provided: yes    Hypertension  Education components: BP goal and disease risk factors, encouraged patient to monitor BP at home and low salt diet encouraged    Time spent with patient: 20 minutes    Thank you for allowing me to participate in the care of your patient,  Lynelle Smoke, [facility]  [facility] Primary Care Clinical Pharmacist

## 2023-06-08 ENCOUNTER — Encounter: Admit: 2023-06-08 | Discharge: 2023-06-08 | Payer: MEDICARE

## 2023-06-08 MED ORDER — AMLODIPINE 10 MG PO TAB
ORAL_TABLET | 3 refills | Status: AC
Start: 2023-06-08 — End: ?

## 2023-06-29 ENCOUNTER — Ambulatory Visit: Admit: 2023-06-29 | Discharge: 2023-06-30 | Payer: MEDICARE

## 2023-06-29 NOTE — Progress Notes
A follow-up comprehensive medication management visit was completed today via telephone.    Referral reason: Hypertension Management and [facility]  Referring provider: Dr. Sherlyn Hay, MD    [facility] enrollment date: 10/02/2022  [facility] study end date: 10/02/2024  Study coordinator: Shelbie Ammons    Assessment & Plan     Hypertension  Patient's blood pressure is controlled per Particia Fletcher report and clinic readings. They have had a microalbumin completed. They have normal-mild albuminuria present. Their goal blood pressure is < 130/80 mmHg based on 2018 ACC/AHA guidelines.    Last clinic BP reading on 3/29: 127/67 mmHg    Qardio Home Blood Pressure Readings:  7 day average: 119/79 mmHg, HR: 84  1 month average:  137/88 mmHg, HR: 73  3 month average: 135/87 mmHg, HR: 73    Rhonda Fletcher's blood pressures over the past week are controlled to goal.  She notes she has continued going to the gym. Encouraged patient to continue with exercise! A few elevated BP readings she notes to stress from her neighbors. She recently had a BP reading after the gym of 103/67 mmHg. She is agreeable to taking home BP readings BID three times a week so we can get a better idea if BP readings are decreasing throughout the day since home BP averages are within goal/close to goal. Therefore, we will not make any changes today.    Plan  START:  Checking home BP BID three times per week (in the morning before BP medications and then at 12PM or prior to bed)    CONTINUE:  Irbesartan 150 mg by mouth daily at night (started around 5/24)  - BMP 04/2023 was WNL after irbesartan re-initiation   - No renal dose adjustment needed     Amlodipine 10mg  daily in the morning     Carvedilol 25mg  po twice daily with breakfast and dinner     Ensure resting 5-10 minutes before blood pressure reading     Limiting foods high in salt such as fried chicken (has about 500 mg of sodium per 3 oz serving)    Going to the gym about 3-4 days per week for about 6-8 hours per week    KEEP:  Appointment with Dr. Philomena Course on 9/18 and AWV with [facility] on 9/20. We will review these clinic BP readings at our next visit as well     Future considerations:  Patient does report she has clonidine patches at home. However, she does not wish to re-start these at this time  Initiating therapy such as chlorthalidone. Discussed with Dr. Philomena Course who agrees with this plan if needed and ensuring follow-up labs. Appreciate Dr. Vanice Sarah input!      Follow-up  The patient will continue to follow up with the pharmacist. Return to pharmacist in 6 weeks via telephone. The return visit was scheduled during today's visit.    Subjective & Objective      Rhonda Fletcher was referred to the pharmacy team for hypertension management. She does report dizziness all the time. Rhonda Fletcher reports ensuring she is resting prior to taking blood pressure. Patient does note her neighbor contributes to a higher stress level. She has eliminated foods like fried chicken that tend to have a higher sodium amount from her diet.    Patient reports she did try a couple clonidine patches for a couple days and they made her feel sluggish so she has not been utilizing them for blood pressure control. She does not recall what  her blood pressures were reading while utilizing the clonidine patches.     Hypertension    HPI  Current hypertension medication regimen:   Irbesartan to 150 mg daily  Amlodipine 10mg  daily (maximum dose)  Carvedilol 25mg  po twice daily (maximum dose)  Previous medications for hypertension: Hctz (nausea/vomiting), spironolactone (nausea/vomiting), hydralazine (burned stomach), clonidine (felt sluggish)    The baseline blood pressure was 143/75 on 08/31/2022.    Lifestyle  Physical activity/exercise: yes. Tries to go to the gym. Wants to go to the gym 3-4 times per week in the new year.  Adds salt to food: no. sometimes add seasoning salt  Caffeine consumption: yes. 1-2 cups of coffee each morning  Elevated stress: yes. neighbor does cause stress  Alcohol use: yes. 1 glass of wine every once in a while  Tobacco use: no    Monitoring  Patient instructed to check blood pressure 3 time(s) per week.  Cuff type: Qardio  Appropriate technique: yes    Symptom Review  Hypotension: yes. Sometimes from sitting to standing. Rhonda Fletcher will start checking her BP when this occurs.  Chest pain: no  Shortness of breath: no  Lower extremity edema: no. High blood pressure symptoms include lightheadedness, headache, change in vision    Comorbidities  ASCVD condition(s): none  CKD: yes  Diabetes: yes (type 2 and controlled)  Dyslipidemia: yes    Health Maintenance  Aspirin utilization: Patient is not taking aspirin due to the following reason(s): not indicated.  Statin utilization: Patient is not taking a statin.    Qardio Blood Pressure Readings:          Primary Care - Labs  BP Readings from Last 3 Encounters:   01/26/23 127/67   12/26/22 (!) 154/86   08/31/22 (!) 143/75     Pulse Readings from Last 3 Encounters:   01/26/23 72   12/26/22 77   08/31/22 63     Comprehensive Metabolic Profile    Lab Results   Component Value Date/Time    NA 139 05/08/2023 10:45 AM    K 4.7 05/08/2023 10:45 AM    CL 108 05/08/2023 10:45 AM    CO2 22 05/08/2023 10:45 AM    GAP 9 05/08/2023 10:45 AM    BUN 27 (H) 05/08/2023 10:45 AM    CR 1.51 (H) 05/08/2023 10:45 AM    GLU 119 (H) 05/08/2023 10:45 AM    GLU 146 (H) 02/21/2018 09:45 AM    Lab Results   Component Value Date/Time    CA 9.8 05/08/2023 10:45 AM    PO4 3.3 01/24/2023 08:44 AM    ALBUMIN 4.1 07/25/2022 12:07 PM    TOTPROT 7.4 07/25/2022 12:07 PM    ALKPHOS 82 07/25/2022 12:07 PM    AST 13 07/25/2022 12:07 PM    ALT 12 07/25/2022 12:07 PM    TOTBILI 0.5 07/25/2022 12:07 PM    GFR 33 (L) 08/24/2020 09:30 AM    GFRAA 40 (L) 08/24/2020 09:30 AM        eGFR   Date Value Ref Range Status   05/08/2023 38 (L) >60 mL/min Final     Comment:     eGFR calculated using the CKD-EPIcr_R equation     Lab Results Component Value Date/Time    URMALBCRRAT 30.53 (H) 01/24/2023 08:49 AM     BMI Readings from Last 1 Encounters:   01/26/23 27.99 kg/m?     The 10-year ASCVD risk score (Arnett DK, et al., 2019) is: 24%  Values used to calculate the score:      Age: 69 years      Sex: Female      Is Non-Hispanic African American: Yes      Diabetic: Yes      Tobacco smoker: No      Systolic Blood Pressure: 127 mmHg      Is BP treated: Yes      HDL Cholesterol: 97 MG/DL      Total Cholesterol: 232 MG/DL     Medication History  Medication history was not completed because previously completed (follow-up visit).    Adverse Drug Reactions    Side effect(s) were not reported.    Refills needed: no    Home Medications    Medication Sig   ACCU-CHEK GUIDE TEST STRIPS test strip TEST BLOOD SUGAR AS DIRECTED DAILY BEFORE BREAKFAST.   albuterol sulfate (PROAIR HFA) 90 mcg/actuation HFA aerosol inhaler Inhale two puffs by mouth into the lungs every 6 hours as needed.   alcohol swabs (DROPSAFE ALCOHOL PREP PADS) USE TO CLEAN SKIN BEFORE FINGERSTICK   amLODIPine (NORVASC) 10 mg tablet TAKE 1 TABLET EVERY DAY   azelastine (ASTELIN) 137 mcg (0.1 %) nasal spray Apply two sprays to each nostril as directed twice daily.   betamethasone valerate (VALISONE) 0.1 % topical cream APPLY TOPICALLY TO AFFECTED AREA DAILY   Blood Glucose Control, Normal soln Use in glucose meter, Accu-check Aviva   Blood-Glucose Meter kit Use as directed to check sugar once a day.  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   carvediloL (COREG) 25 mg tablet TAKE ONE TABLET BY MOUTH TWICE DAILY WITH MEALS. TAKE WITH FOOD.   cetirizine (ZYRTEC) 10 mg tablet Take one tablet by mouth daily.     cholecalciferol(+) (VITAMIN D-3) 2,000 unit tablet Take 1 Tab by mouth daily.  Patient taking differently: Take one tablet by mouth daily. 5,000 units daily   cyanocobalamin (vitamin B-12) 5,000 mcg cap Take 1 tablet by mouth daily.   cyclobenzaprine (FLEXERIL) 5 mg tablet TAKE 1 TABLET AT BEDTIME   ezetimibe (ZETIA) 10 mg tablet Take one tablet by mouth daily.   famotidine (PEPCID) 20 mg tablet Take one tablet by mouth daily.   ferrous sulfate (FEOSOL) 325 mg (65 mg iron) tablet Take one tablet by mouth every 48 hours.   fluconazole (DIFLUCAN) 150 mg tablet Take one tablet by mouth daily.   fluticasone propionate (FLONASE) 50 mcg/actuation nasal spray, suspension USE 2 SPRAYS IN EACH NOSTRIL AS DIRECTED DAILY. SHAKE BOTTLE GENTLY BEFORE USING   glimepiride (AMARYL) 4 mg tablet Take one tablet by mouth every morning.   irbesartan (AVAPRO) 150 mg tablet Take one tablet by mouth at bedtime daily.   lancets MISC Use one each as directed daily before breakfast. Diag: E11.29   mirtazapine (REMERON) 15 mg tablet TAKE ONE TABLET BY MOUTH AT BEDTIME DAILY.   pantoprazole DR (PROTONIX) 40 mg tablet Take one tablet by mouth daily.   pioglitazone (ACTOS) 45 mg tablet TAKE 1 TABLET EVERY DAY   sodium bicarbonate 650 mg tablet Take one tablet by mouth twice daily.      Education  Education provided: yes    Hypertension  Education components: BP goal and disease risk factors and encouraged patient to monitor BP at home    Time spent with patient: 20 minutes    Thank you for allowing me to participate in the  care of your patient,  Lynelle Smoke, [facility]  [facility] Primary Care Clinical Pharmacist

## 2023-07-03 ENCOUNTER — Encounter: Admit: 2023-07-03 | Discharge: 2023-07-03 | Payer: MEDICARE

## 2023-07-03 NOTE — Telephone Encounter
ED Discharge Follow Up  Reached patient: No LM to contact PCP office with any questions/concerns, or to schedule f/u appt.  Patient Date of Birth: 1955/12/19  Admission Information  Hospital Name : [facility]  ED Admission Date: 06/27/23   ED Discharge Date: 06/27/23   Admission Diagnosis: Pain, RT SIDED FLANK  Discharge Diagnosis: UTI (urinary tract infection)   Hospital Services: Unplanned  Today's call is 6 (calendar) days post discharge    Medication Reconciliation  Changes to pre-ED visit medications? No  Were new prescriptions filled? N/A  HYDROcodone/ACETAMINOPHEN (HYDROcodone-Acetamin 5-325 MG) 1 EACH TAB Active 1 TAB PO EVERY 6 HOURS AS NEEDED as needed for Pain as needed for Pain 10   August 28th, 2024 3:53pm     ONDANSETRON ODT (ZOFRAN ODT4 MG) 4 MG TAB.RAPDIS Active 4 MG PO EVERY 6 HOURS AS NEEDED as needed for nausea as needed for nausea 15   August 28th, 2024 3:52pm     SULFAMETHOXAZOLE/TRIMETHOPRIM (BACTRIMDS) 1 EACH TAB Active 1 TAB PO TWICE A DAY for infection 20 10 August 28th, 2024 3:52pm     Meds reviewed and reconciled? Yes    Current Outpatient Medications   Medication Instructions    ACCU-CHEK GUIDE TEST STRIPS test strip TEST BLOOD SUGAR AS DIRECTED DAILY BEFORE BREAKFAST.    albuterol sulfate (PROAIR HFA) 90 mcg/actuation HFA aerosol inhaler 2 puffs, Inhalation, EVERY  6 HOURS PRN    alcohol swabs (DROPSAFE ALCOHOL PREP PADS) USE TO CLEAN SKIN BEFORE FINGERSTICK    amLODIPine (NORVASC) 10 mg tablet TAKE 1 TABLET EVERY DAY    azelastine (ASTELIN) 137 mcg (0.1 %) nasal spray 2 sprays, Each Nostril, TWICE DAILY    betamethasone valerate (VALISONE) 0.1 % topical cream APPLY TOPICALLY TO AFFECTED AREA DAILY    Blood Glucose Control, Normal soln Use in glucose meter, Accu-check Aviva    Blood-Glucose Meter kit Use as directed to check sugar once a day.  Diagnosis Code: E11.29    budesonide-formoterol HFA (SYMBICORT) 160-4.5 mcg/actuation aerosol inhaler 2 puffs, Inhalation, TWICE DAILY    carvediloL (COREG) 25 mg, Oral, TWICE DAILY WITH MEALS, Take with food.    cetirizine (ZYRTEC) 10 mg, Oral, DAILY,      CHOLEcalciferoL (vitamin D3) (VITAMIN D3) 2,000 Units, Oral, DAILY    cyanocobalamin (vitamin B-12) 5,000 mcg cap 1 tablet, Oral, DAILY    cyclobenzaprine (FLEXERIL) 5 mg tablet TAKE 1 TABLET AT BEDTIME    ezetimibe (ZETIA) 10 mg, Oral, DAILY    famotidine (PEPCID) 20 mg, Oral, DAILY    ferrous sulfate (FEOSOL) 325 mg, Oral, EVERY 48 HOURS    fluconazole (DIFLUCAN) 150 mg, Oral, DAILY    fluticasone propionate (FLONASE) 50 mcg/actuation nasal spray, suspension USE 2 SPRAYS IN EACH NOSTRIL AS DIRECTED DAILY. SHAKE BOTTLE GENTLY BEFORE USING    glimepiride (AMARYL) 4 mg, Oral, EVERY MORNING    irbesartan (AVAPRO) 150 mg, Oral, AT BEDTIME DAILY    lancets MISC 1 each, Test, DAILY BEFORE BREAKFAST, Diag: E11.29    mirtazapine (REMERON) 15 mg, Oral, AT BEDTIME DAILY    pantoprazole DR (PROTONIX) 40 mg, Oral, DAILY    pioglitazone (ACTOS) 45 mg, DAILY    sodium bicarbonate 650 mg, Oral, TWICE DAILY        SDOH Assessment         Scheduling Follow-up Appointment  Upcoming appointments:   Future Appointments   Date Time Provider Department Center   07/18/2023  9:00 AM Doran Durand, MD [facility]  07/20/2023  1:30 PM Shanda Howells, APRN-NP [facility]   08/15/2023 10:00 AM Lynelle Smoke, [facility]     When was patient?s last PCP visit: 12/26/2022  PCP primary location: [facility]  PCP appointment scheduled? No, routine appt 07/20/23  Specialist appointment scheduled? Yes, with Nephrology 07/18/23  Is assistance with transportation needed? No  MyChart message sent? Active in MyChart. MyChart message sent.  Artera text sent? No      ED Communication   Did patient call clinic prior to going to ED? No  Reason patient went to ED: Unable to obtain    Lavell Islam

## 2023-07-04 ENCOUNTER — Encounter: Admit: 2023-07-04 | Discharge: 2023-07-04 | Payer: MEDICARE

## 2023-07-04 MED ORDER — ALBUTEROL SULFATE 90 MCG/ACTUATION IN HFAA
2 | RESPIRATORY_TRACT | 3 refills | Status: AC | PRN
Start: 2023-07-04 — End: ?

## 2023-07-11 ENCOUNTER — Encounter: Admit: 2023-07-11 | Discharge: 2023-07-11 | Payer: MEDICARE

## 2023-07-11 DIAGNOSIS — N1832 Stage 3b chronic kidney disease (HCC): Secondary | ICD-10-CM

## 2023-07-18 ENCOUNTER — Encounter: Admit: 2023-07-18 | Discharge: 2023-07-18 | Payer: MEDICARE

## 2023-07-18 ENCOUNTER — Ambulatory Visit: Admit: 2023-07-18 | Discharge: 2023-07-18 | Payer: MEDICARE

## 2023-07-18 ENCOUNTER — Ambulatory Visit: Admit: 2023-07-18 | Discharge: 2023-07-19 | Payer: MEDICARE

## 2023-07-18 DIAGNOSIS — N1832 Stage 3b chronic kidney disease (HCC): Secondary | ICD-10-CM

## 2023-07-18 DIAGNOSIS — G9050 Complex regional pain syndrome I, unspecified: Secondary | ICD-10-CM

## 2023-07-18 DIAGNOSIS — K589 Irritable bowel syndrome without diarrhea: Secondary | ICD-10-CM

## 2023-07-18 DIAGNOSIS — I10 Essential (primary) hypertension: Secondary | ICD-10-CM

## 2023-07-18 DIAGNOSIS — J45909 Unspecified asthma, uncomplicated: Secondary | ICD-10-CM

## 2023-07-18 DIAGNOSIS — M79642 Pain in left hand: Secondary | ICD-10-CM

## 2023-07-18 DIAGNOSIS — K2970 Gastritis, unspecified, without bleeding: Secondary | ICD-10-CM

## 2023-07-18 DIAGNOSIS — J309 Allergic rhinitis, unspecified: Secondary | ICD-10-CM

## 2023-07-18 DIAGNOSIS — E119 Type 2 diabetes mellitus without complications: Secondary | ICD-10-CM

## 2023-07-18 DIAGNOSIS — D649 Anemia, unspecified: Secondary | ICD-10-CM

## 2023-07-18 DIAGNOSIS — E559 Vitamin D deficiency, unspecified: Secondary | ICD-10-CM

## 2023-07-18 DIAGNOSIS — G5600 Carpal tunnel syndrome, unspecified upper limb: Secondary | ICD-10-CM

## 2023-07-18 DIAGNOSIS — E785 Hyperlipidemia, unspecified: Secondary | ICD-10-CM

## 2023-07-18 DIAGNOSIS — K5900 Constipation, unspecified: Secondary | ICD-10-CM

## 2023-07-18 DIAGNOSIS — E872 Metabolic acidosis: Secondary | ICD-10-CM

## 2023-07-18 DIAGNOSIS — N179 Acute kidney failure, unspecified: Secondary | ICD-10-CM

## 2023-07-18 LAB — URINALYSIS, MICROSCOPIC

## 2023-07-18 LAB — BASIC METABOLIC PANEL
ANION GAP: 10 (ref 3–12)
BLD UREA NITROGEN: 43 mg/dL — ABNORMAL HIGH (ref 7–25)
CALCIUM: 9.6 mg/dL (ref 8.5–10.6)
CHLORIDE: 105 MMOL/L (ref 98–110)
CO2: 18 MMOL/L — ABNORMAL LOW (ref 21–30)
CREATININE: 2 mg/dL — ABNORMAL HIGH (ref 0.4–1.00)
EGFR: 26 mL/min — ABNORMAL LOW (ref >60)
GLUCOSE,PANEL: 236 mg/dL — ABNORMAL HIGH (ref 70–100)
POTASSIUM: 4.8 MMOL/L (ref 3.5–5.1)
SODIUM: 133 MMOL/L — ABNORMAL LOW (ref 137–147)

## 2023-07-18 LAB — URINALYSIS DIPSTICK
GLUCOSE,UA: NEGATIVE
PROTEIN,UA: NEGATIVE
URINE SPEC GRAVITY: 1 (ref 1.005–1.030)

## 2023-07-18 LAB — PROTEIN/CR RATIO,UR RAN
UR CREATININE, RAN: 134 mg/dL
UR TOTAL PROTEIN,RAN: 11 mg/dL

## 2023-07-18 LAB — MICROALB/CR RATIO-URINE RANDOM: UR CREATININE, RAN: 131 mg/dL

## 2023-07-18 MED ORDER — PRIOR AUTHORIZATION PLACEHOLDER (DO NOT RELEASE)
Freq: Once | 0 refills
Start: 2023-07-18 — End: ?

## 2023-07-18 MED ORDER — FAMOTIDINE 20 MG PO TAB
20 mg | ORAL_TABLET | Freq: Two times a day (BID) | ORAL | 3 refills | 90.00000 days | Status: AC
Start: 2023-07-18 — End: ?

## 2023-07-18 MED ORDER — LACTATED RINGERS IV SOLP
30 mL/kg | Freq: Once | INTRAVENOUS | 0 refills
Start: 2023-07-18 — End: ?
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
## 2023-07-18 NOTE — Patient Instructions
The following are some measures which may help to slow down the progression of kidney disease:      Maintain adequate fluid intake.    Acute illnesses like the flu and COVID which may end up in hospitalization may impact on  kidney function and could cause the kidneys to fail.    It is important to keep up with  regular vaccinations for these diseases including the pneumonia vaccine as well.    If you develop diarrhea or vomiting and  are unable to keep up with your fluid intake, please report to the nearest ED since it may affect blood pressure and affect the blood flow to the kidney.    Avoid medications known as  NSAIDs which  include Advil, Motrin,Aleve, ibuprofen, naproxen.    Some medications used in patients with kidney disease including diuretics [water pill], some blood pressure medications [ACE inhibitors and ARB] like lisinopril, losartan may need to be held if you develop severe diarrhea or vomiting.  Under these conditions these medications may worsen kidney function and therefore need to be held until diarhea/vomiting resolves. If you have to hold your blood pressure or water pill medication for any of these reasons, please inform the provider who prescribed the medication.    Some medications used for stomach acid suppression  known as Proton Pump Inhibitors (which include pantoprazole, omeprazole, lansoprazole, nexium, protonix, prilosec) have been associated with kidney disease through unknown mechanisms. Unless you have a compelling indication for any of these medications , consider switching to a different class of medications known as histamine-2 receptor blockers eg. famotidine, ranitidine  etc (your pharmacist or PCP can recommend a brand).    Nutrition management is a critical component in slowing kidney disease progression.  Visit the following web site for information on diet in patients with kidney disease:  https://www.kidney.org/nutrition    Visit the following website for health education videos on kidney disease:  [email or web address].kidney.[email or web address]

## 2023-07-19 ENCOUNTER — Encounter: Admit: 2023-07-19 | Discharge: 2023-07-19 | Payer: MEDICARE

## 2023-07-19 DIAGNOSIS — N1832 Chronic kidney disease, stage 3b (HCC): Secondary | ICD-10-CM
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
## 2023-07-20 ENCOUNTER — Ambulatory Visit: Admit: 2023-07-20 | Discharge: 2023-07-20 | Payer: MEDICARE

## 2023-07-20 ENCOUNTER — Encounter: Admit: 2023-07-20 | Discharge: 2023-07-20 | Payer: MEDICARE

## 2023-07-20 DIAGNOSIS — J309 Allergic rhinitis, unspecified: Secondary | ICD-10-CM

## 2023-07-20 DIAGNOSIS — E785 Hyperlipidemia, unspecified: Secondary | ICD-10-CM

## 2023-07-20 DIAGNOSIS — E559 Vitamin D deficiency, unspecified: Secondary | ICD-10-CM

## 2023-07-20 DIAGNOSIS — R809 Proteinuria, unspecified: Secondary | ICD-10-CM

## 2023-07-20 DIAGNOSIS — D125 Benign neoplasm of sigmoid colon: Secondary | ICD-10-CM

## 2023-07-20 DIAGNOSIS — E1129 Type 2 diabetes mellitus with other diabetic kidney complication: Secondary | ICD-10-CM

## 2023-07-20 DIAGNOSIS — K2970 Gastritis, unspecified, without bleeding: Secondary | ICD-10-CM

## 2023-07-20 DIAGNOSIS — N184 Chronic kidney disease, stage 4 (severe): Secondary | ICD-10-CM

## 2023-07-20 DIAGNOSIS — D649 Anemia, unspecified: Secondary | ICD-10-CM

## 2023-07-20 DIAGNOSIS — J45909 Unspecified asthma, uncomplicated: Secondary | ICD-10-CM

## 2023-07-20 DIAGNOSIS — G9050 Complex regional pain syndrome I, unspecified: Secondary | ICD-10-CM

## 2023-07-20 DIAGNOSIS — I10 Essential (primary) hypertension: Secondary | ICD-10-CM

## 2023-07-20 DIAGNOSIS — M79642 Pain in left hand: Secondary | ICD-10-CM

## 2023-07-20 DIAGNOSIS — D509 Iron deficiency anemia, unspecified: Secondary | ICD-10-CM

## 2023-07-20 DIAGNOSIS — K219 Gastro-esophageal reflux disease without esophagitis: Secondary | ICD-10-CM

## 2023-07-20 DIAGNOSIS — E538 Deficiency of other specified B group vitamins: Secondary | ICD-10-CM

## 2023-07-20 DIAGNOSIS — Z0000 Encounter for general adult medical examination without abnormal findings: Secondary | ICD-10-CM

## 2023-07-20 DIAGNOSIS — Z1211 Encounter for screening for malignant neoplasm of colon: Secondary | ICD-10-CM

## 2023-07-20 DIAGNOSIS — J4520 Mild intermittent asthma, uncomplicated: Secondary | ICD-10-CM

## 2023-07-20 DIAGNOSIS — F22 Delusional disorders: Secondary | ICD-10-CM

## 2023-07-20 DIAGNOSIS — K589 Irritable bowel syndrome without diarrhea: Secondary | ICD-10-CM

## 2023-07-20 DIAGNOSIS — K580 Irritable bowel syndrome with diarrhea: Secondary | ICD-10-CM

## 2023-07-20 DIAGNOSIS — E119 Type 2 diabetes mellitus without complications: Secondary | ICD-10-CM

## 2023-07-20 DIAGNOSIS — G5600 Carpal tunnel syndrome, unspecified upper limb: Secondary | ICD-10-CM

## 2023-07-20 DIAGNOSIS — F5104 Psychophysiologic insomnia: Secondary | ICD-10-CM

## 2023-07-20 DIAGNOSIS — K5900 Constipation, unspecified: Secondary | ICD-10-CM

## 2023-07-20 MED ORDER — PRIOR AUTHORIZATION PLACEHOLDER (DO NOT RELEASE)
Freq: Once | 0 refills
Start: 2023-07-20 — End: ?

## 2023-07-20 MED ORDER — LACTATED RINGERS IV SOLP
2000 mL | Freq: Once | INTRAVENOUS | 0 refills | Status: CP
Start: 2023-07-20 — End: ?
  Administered 2023-07-20: 13:00:00 2000 mL via INTRAVENOUS

## 2023-07-20 MED ORDER — LACTATED RINGERS IV SOLP
30 mL/kg | Freq: Once | INTRAVENOUS | 0 refills | Status: CN
Start: 2023-07-20 — End: ?

## 2023-07-20 NOTE — Progress Notes
Patient presents to infusion clinic for lactated ringers infusion.  Pre-infusion vital signs completed. Peripheral IV started.     ~1015: Infusion completed and flushed. See e-mar for administration details and Post infusion vitals completed. No concern for infusion reaction. Patient has no complaints.  PIV discontinued. Patient declined printed AVS. Prompted patient to schedule upcoming appointment(s) at infusion checkout or call infusion scheduling. Patient verbalized understanding. Patient ambulated off unit.     Vitals:    07/20/23 0747 07/20/23 1017   BP: 138/76 127/72   BP Source: Arm, Right Upper Arm, Right Upper   Pulse: 68 74   Temp: 36.7 ?C (98.1 ?F)    Resp: 18    SpO2: 100% 99%   O2 Device: None (Room air)    TempSrc: Temporal    Weight: 75.3 kg (166 lb)    Height: 167.6 cm (5' 6)

## 2023-07-20 NOTE — Patient Instructions
Medicare Primary Care Screening Schedule:    Health Maintenance   Topic Date Due    DTAP/TDAP VACCINES (1 - Tdap) Never done    SHINGLES RECOMBINANT VACCINE (1 of 2) Never done    DIABETES FOOT EXAM (.dmfoot)  03/01/2023    DIABETES HBA1C  03/26/2023    INFLUENZA VACCINE (1) 05/31/2023    COVID-19 VACCINE (7 - 2023-24 season) 07/01/2023    BREAST CANCER SCREENING  03/05/2024    COLORECTAL CANCER SCREENING  05/14/2024    DIABETES MICROALBUMIN TO CREATININE RATIO  07/17/2024    DIABETES EGFR SCREEN  07/17/2024    PHYSICAL (COMPREHENSIVE) EXAM  07/19/2024    MEDICARE ANNUAL WELLNESS VISIT  07/19/2024    ADVANCE CARE PLANNING DISCUSSION AND DOCUMENTATION  07/19/2024    DIABETES DILATED EYE EXAM  07/26/2024    OSTEOPOROSIS SCREENING/MONITORING  Completed    PNEUMOCOCCAL VACCINE 65+ YRS  Completed    HEPATITIS C SCREENING  Completed    DEPRESSION SCREENING  Completed    HPV VACCINES  Aged Out    CERVICAL CANCER SCREENING  Discontinued              Covid booster, Shingrix and Tdap at pharmacy.   Schedule colonoscopy for NEXT July 2025.

## 2023-07-21 LAB — CBC AND DIFF
ABSOLUTE BASO COUNT: 0 10*3/uL (ref 0–0.20)
ABSOLUTE EOS COUNT: 0.2 10*3/uL (ref 0–0.45)
ABSOLUTE LYMPH COUNT: 1.9 10*3/uL (ref 1.0–4.8)
ABSOLUTE MONO COUNT: 0.6 10*3/uL (ref 0–0.80)
ABSOLUTE NEUTROPHIL: 4.6 10*3/uL (ref 1.8–7.0)
BASOPHILS %: 1 % (ref 0–2)
EOSINOPHILS %: 3 % (ref 0–5)
HEMATOCRIT: 30 % — ABNORMAL LOW (ref 36–45)
HEMOGLOBIN: 10 g/dL — ABNORMAL LOW (ref <100)
LYMPHOCYTES %: 26 % (ref 24–44)
MCH: 27 pg (ref 26–34)
MCHC: 33 g/dL (ref 32.0–36.0)
MONOCYTES %: 9 % (ref 4–12)
MPV: 8.4 FL (ref 7–11)
NEUTROPHILS %: 61 % — ABNORMAL LOW (ref >60)
PLATELET COUNT: 279 10*3/uL (ref 150–400)
RBC COUNT: 3.7 M/UL — ABNORMAL LOW (ref >40)
RDW: 15 % — ABNORMAL HIGH (ref 11–15)
WBC COUNT: 7.6 10*3/uL — ABNORMAL HIGH (ref <150)

## 2023-07-21 LAB — IRON + BINDING CAPACITY + %SAT+ FERRITIN
IRON BINDING: 399 ug/dL — ABNORMAL HIGH (ref 270–380)
IRON: 62 ug/dL (ref 50–160)

## 2023-07-21 LAB — COMPREHENSIVE METABOLIC PANEL: SODIUM: 139 MMOL/L (ref 137–147)

## 2023-07-21 LAB — LIPID PROFILE: CHOLESTEROL: 253 mg/dL — ABNORMAL HIGH (ref <200)

## 2023-07-21 LAB — TSH WITH FREE T4 REFLEX: TSH: 0.8 uU/mL — ABNORMAL LOW (ref 0.35–5.00)

## 2023-07-23 ENCOUNTER — Encounter: Admit: 2023-07-23 | Discharge: 2023-07-23 | Payer: MEDICARE

## 2023-07-23 DIAGNOSIS — D649 Anemia, unspecified: Secondary | ICD-10-CM

## 2023-07-23 MED ORDER — EZETIMIBE 10 MG PO TAB
10 mg | ORAL_TABLET | Freq: Every day | ORAL | 0 refills | Status: AC
Start: 2023-07-23 — End: ?

## 2023-08-15 ENCOUNTER — Encounter: Admit: 2023-08-15 | Discharge: 2023-08-15 | Payer: MEDICARE

## 2023-08-20 ENCOUNTER — Encounter: Admit: 2023-08-20 | Discharge: 2023-08-20 | Payer: MEDICARE

## 2023-08-20 DIAGNOSIS — E1129 Type 2 diabetes mellitus with other diabetic kidney complication: Secondary | ICD-10-CM

## 2023-08-20 MED ORDER — GLIMEPIRIDE 4 MG PO TAB
4 mg | ORAL_TABLET | 3 refills | Status: AC
Start: 2023-08-20 — End: ?

## 2023-08-20 MED ORDER — ACCU-CHEK SOFTCLIX LANCETS MISC MISC
3 refills | 30.00000 days | Status: AC
Start: 2023-08-20 — End: ?

## 2023-08-22 ENCOUNTER — Encounter: Admit: 2023-08-22 | Discharge: 2023-08-22 | Payer: MEDICARE

## 2023-08-22 ENCOUNTER — Ambulatory Visit: Admit: 2023-08-22 | Discharge: 2023-08-23 | Payer: MEDICARE

## 2023-09-24 ENCOUNTER — Encounter: Admit: 2023-09-24 | Discharge: 2023-09-24 | Payer: MEDICARE

## 2023-10-03 ENCOUNTER — Ambulatory Visit: Admit: 2023-10-03 | Discharge: 2023-10-04 | Payer: MEDICARE

## 2023-10-03 ENCOUNTER — Encounter: Admit: 2023-10-03 | Discharge: 2023-10-03 | Payer: MEDICARE

## 2023-10-03 NOTE — Progress Notes
A follow-up comprehensive medication management visit was completed today via telephone.    Referral reason: Hypertension Management and [facility]  Referring provider: Dr. Sherlyn Hay, MD    [facility] enrollment date: 10/02/2022  [facility] study end date: 10/02/2024  Study coordinator: Shelbie Ammons    Assessment & Plan     Hypertension  Patient's blood pressure is controlled per home readings and clinic readings. They have had a microalbumin completed. They have normal-mild albuminuria present. Their goal blood pressure is < 130/80 mmHg based on 2018 ACC/AHA guidelines.    Average of last 3 clinic BP readings in September: 129/78 mmHg    Patient Reported Home Blood Pressure Readings:  - 130/72 mmHg and 122/72 mmHg    Rhonda Fletcher's average clinic blood pressures are controlled to goal.  She notes she has continued going to the gym. Encouraged patient to continue with exercise. She mentioned she had an AKI the beginning of September due to food poisoning. Her creatinine on 9/20 returned to around her baseline at 1.72. She notes re-starting her irbesartan around 10/22 as BP readings were starting to increase. Asked Ms. Fletcher to go to lab for BMP to monitor creatinine with irbesartan re-initiation.      Plan  CONTINUE:  Checking home BP BID three times per week (in the morning before BP medications and then at 12PM or prior to bed)    Irbesartan 150 mg by mouth daily at night (re-started around 08/21/23 - stopped in September after AKI due to food poisoning)  - Dr. Philomena Course has active BMP ordered for Rhonda Fletcher. Asked her to go to lab for repeat BMP   - No renal dose adjustment needed     Amlodipine 10mg  daily in the morning     Carvedilol 25mg  po twice daily with breakfast and dinner     Ensure resting 5-10 minutes before blood pressure reading     Limiting foods high in salt such as fried chicken (has about 500 mg of sodium per 3 oz serving)    Going to the gym about 3-4 days per week for about 6-8 hours per week    MISC:  - Emailed the [facility] study team on 10/03/23 to ask if new BP cuff could be sent to Rhonda Fletcher's daughter's house or Dr. Iline Oven office per patient request for remote BP monitoring     Future considerations:  Patient does report she has clonidine patches at home. However, she does not wish to re-start these at this time  Initiating therapy such as chlorthalidone. Discussed with Dr. Philomena Course who agrees with this plan if needed and ensuring follow-up labs. Appreciate Dr. Vanice Sarah input!      Follow-up  The patient will continue to follow up with the pharmacist. Return to pharmacist in 6 weeks via telephone. The return visit was scheduled during today's visit.    Subjective & Objective   At the last visit, the patient was instructed to Go to lab for repeat BMP after irbesartan re-start. The patient states they made none of the recommended changes.     [name] was referred to the pharmacy team for hypertension management. She does report dizziness all the time. Ms. Bobetta reports ensuring she is resting prior to taking blood pressure. Patient does note her neighbor contributes to a higher stress level. She has eliminated foods like fried chicken that tend to have a higher sodium amount from her diet.    10/03/23: [name] notes some acid reflux symptoms after  eating certain foods. She believes she is taking famotidine about an hour before food. Discussed this can be taken 10-60 minutes before food and to try taking about 30 minutes before eating to see if this helps and please let Rhonda Fletcher know if not so she can be seen in office as needed.     History:  Patient reports she did try a couple clonidine patches for a couple days and they made her feel sluggish so she has not been utilizing them for blood pressure control. She does not recall what her blood pressures were reading while utilizing the clonidine patches.     Hypertension    HPI  Current hypertension medication regimen:   Irbesartan to 150 mg daily  Amlodipine 10mg  daily (maximum dose)  Carvedilol 25mg  po twice daily (maximum dose)  Previous medications for hypertension: Hctz (nausea/vomiting), spironolactone (nausea/vomiting), hydralazine (burned stomach), clonidine (felt sluggish)    The baseline blood pressure was 143/75 on 08/31/2022.    Lifestyle  Physical activity/exercise: yes. Tries to go to the gym. Wants to go to the gym 3-4 times per week in the new year.  Adds salt to food: no. sometimes add seasoning salt  Caffeine consumption: yes. 1-2 cups of coffee each morning  Elevated stress: yes. neighbor does cause stress  Alcohol use: yes. 1 glass of wine every once in a while  Tobacco use: no    Monitoring  Patient instructed to check blood pressure 3 time(s) per week.  Cuff type: Qardio  Appropriate technique: yes    Symptom Review  Hypotension: yes. Sometimes from sitting to standing. Believes this is realted to the Vertigo.  Chest pain: no  Shortness of breath: no  Lower extremity edema: no. High blood pressure symptoms include lightheadedness, headache, change in vision    Comorbidities  ASCVD condition(s): none  CKD: yes  Diabetes: yes (type 2 and controlled)  Dyslipidemia: yes    Health Maintenance  Aspirin utilization: Patient is not taking aspirin due to the following reason(s): not indicated.  Statin utilization: Patient is not taking a statin.    Home Blood Pressure Readings:  - Please see above     Primary Care - Labs  BP Readings from Last 3 Encounters:   07/20/23 138/84   07/20/23 127/72   07/18/23 124/77     Pulse Readings from Last 3 Encounters:   07/20/23 65   07/20/23 74   07/18/23 70     Comprehensive Metabolic Profile    Lab Results   Component Value Date/Time    NA 139 07/20/2023 01:59 PM    K 4.5 07/20/2023 01:59 PM    CL 108 07/20/2023 01:59 PM    CO2 21 07/20/2023 01:59 PM    GAP 10 07/20/2023 01:59 PM    BUN 33 (H) 07/20/2023 01:59 PM    CR 1.72 (H) 07/20/2023 01:59 PM    GLU 116 (H) 07/20/2023 01:59 PM    Lab Results   Component Value Date/Time    CA 9.8 07/20/2023 01:59 PM    PO4 3.3 01/24/2023 08:44 AM    ALBUMIN 4.3 07/20/2023 01:59 PM    TOTPROT 7.3 07/20/2023 01:59 PM    ALKPHOS 84 07/20/2023 01:59 PM    AST 15 07/20/2023 01:59 PM    ALT 12 07/20/2023 01:59 PM    TOTBILI 0.6 07/20/2023 01:59 PM    GFR 33 (L) 08/24/2020 09:30 AM    GFRAA 40 (L) 08/24/2020 09:30 AM        eGFR  Date Value Ref Range Status   07/20/2023 32 (L) >60 mL/min Final     Comment:     eGFR calculated using the CKD-EPIcr_R equation     Lab Results   Component Value Date/Time    URMALBCRRAT 30.53 (H) 01/24/2023 08:49 AM     BMI Readings from Last 1 Encounters:   07/20/23 26.79 kg/m?     The 10-year ASCVD risk score (Arnett DK, et al., 2019) is: 30.4%    Values used to calculate the score:      Age: 75 years      Sex: Female      Is Non-Hispanic African American: Yes      Diabetic: Yes      Tobacco smoker: No      Systolic Blood Pressure: 138 mmHg      Is BP treated: Yes      HDL Cholesterol: 91 MG/DL      Total Cholesterol: 253 MG/DL     Medication History  Medication history was not completed because previously completed (follow-up visit).    Adverse Drug Reactions    Side effect(s) were not reported.    Refills needed: no    Home Medications    Medication Sig   ACCU-CHEK GUIDE TEST STRIPS test strip TEST BLOOD SUGAR AS DIRECTED DAILY BEFORE BREAKFAST.   ACCU-CHEK SOFTCLIX LANCETS MISC TEST BLOOD SUGAR DAILY BEFORE BREAKFAST AS DIRECTED   albuterol sulfate (PROAIR HFA) 90 mcg/actuation HFA aerosol inhaler INHALE TWO PUFFS BY MOUTH INTO THE LUNGS EVERY 6 HOURS AS NEEDED.   alcohol swabs (DROPSAFE ALCOHOL PREP PADS) USE TO CLEAN SKIN BEFORE FINGERSTICK   amLODIPine (NORVASC) 10 mg tablet TAKE 1 TABLET EVERY DAY   azelastine (ASTELIN) 137 mcg (0.1 %) nasal spray Apply two sprays to each nostril as directed twice daily.   betamethasone valerate (VALISONE) 0.1 % topical cream APPLY TOPICALLY TO AFFECTED AREA DAILY   Blood Glucose Control, Normal soln Use in glucose meter, Accu-check Aviva   Blood-Glucose Meter kit Use as directed to check sugar once a day.  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 TAKE WITH FOOD.   cetirizine (ZYRTEC) 10 mg tablet Take one tablet by mouth daily.     cholecalciferol(+) (VITAMIN D-3) 2,000 unit tablet Take 1 Tab by mouth daily.  Patient taking differently: Take one tablet by mouth daily. 5,000 units daily   cyanocobalamin (vitamin B-12) 5,000 mcg cap Take 1 tablet by mouth daily.   cyclobenzaprine (FLEXERIL) 5 mg tablet TAKE 1 TABLET AT BEDTIME   ezetimibe (ZETIA) 10 mg tablet Take one tablet by mouth daily.   famotidine (PEPCID) 20 mg tablet Take one tablet by mouth twice daily.   ferrous sulfate (FEOSOL) 325 mg (65 mg iron) tablet Take one tablet by mouth every 48 hours.   fluticasone propionate (FLONASE) 50 mcg/actuation nasal spray, suspension USE 2 SPRAYS IN EACH NOSTRIL AS DIRECTED DAILY. SHAKE BOTTLE GENTLY BEFORE USING   glimepiride (AMARYL) 4 mg tablet TAKE 1 TABLET EVERY MORNING   irbesartan (AVAPRO) 150 mg tablet Take one tablet by mouth at bedtime daily.   pioglitazone (ACTOS) 45 mg tablet TAKE 1 TABLET EVERY DAY   sodium bicarbonate 650 mg tablet Take one tablet by mouth twice daily.      Education  Education provided: yes    Hypertension  Education components: BP goal and disease risk factors and encouraged patient to monitor BP at home  Time spent with patient: 15 minutes    Thank you for allowing me to participate in the care of your patient,  Lynelle Smoke, [facility]  [facility] Primary [facility] Clinical Pharmacist

## 2023-10-09 ENCOUNTER — Encounter: Admit: 2023-10-09 | Discharge: 2023-10-09 | Payer: MEDICARE

## 2023-10-10 ENCOUNTER — Encounter: Admit: 2023-10-10 | Discharge: 2023-10-10 | Payer: MEDICARE

## 2023-10-10 MED ORDER — CYCLOBENZAPRINE 5 MG PO TAB
ORAL_TABLET | ORAL | 3 refills | 30.00000 days | Status: AC
Start: 2023-10-10 — End: ?

## 2023-10-10 MED ORDER — CARVEDILOL 25 MG PO TAB
25 mg | ORAL_TABLET | Freq: Two times a day (BID) | ORAL | 3 refills | 90.00000 days | Status: AC
Start: 2023-10-10 — End: ?

## 2023-10-22 ENCOUNTER — Encounter: Admit: 2023-10-22 | Discharge: 2023-10-22 | Payer: MEDICARE

## 2023-11-13 ENCOUNTER — Ambulatory Visit: Admit: 2023-11-13 | Discharge: 2023-11-14 | Payer: MEDICARE

## 2023-11-13 ENCOUNTER — Encounter: Admit: 2023-11-13 | Discharge: 2023-11-13 | Payer: MEDICARE

## 2023-11-13 NOTE — Progress Notes
A follow-up comprehensive medication management visit was completed today via telephone.    Referral reason: Hypertension Management and [facility]  Referring provider: Dr. Sherlyn Hay, MD    [facility] enrollment date: 10/02/2022  [facility] study end date: 10/02/2024  Study coordinator: Shelbie Ammons    Assessment & Plan     Hypertension  Patient's blood pressure is controlled per home readings and clinic readings. They have had a microalbumin completed. They have normal-mild albuminuria present. Their goal blood pressure is < 130/80 mmHg based on 2018 ACC/AHA guidelines.    No new BP readings as of 11/13/23.  Average of last 3 clinic BP readings in September: 129/78 mmHg    Patient Reported Home Blood Pressure Readings:  - Last reading: 120/79 mmHg     Ms. Rainbow's average clinic blood pressures are controlled to goal.  She notes she is not going to the gym. Encouraged patient to re-start exercise. She mentioned she had an AKI the beginning of September due to food poisoning. Her creatinine on 9/20 returned to around her baseline at 1.72. She notes re-starting her irbesartan around 10/22 as BP readings were starting to increase. Asked Ms. Zilah to go to lab for BMP to monitor creatinine with irbesartan re-initiation.      Plan  CONTINUE:  Checking home BP BID three times per week (in the morning before BP medications and then at 12PM or prior to bed)    Irbesartan 150 mg by mouth daily at night (re-started around 08/21/23 - stopped in September after AKI due to food poisoning)  - Dr. Philomena Course has active BMP ordered for Ms. Lanique. Asked her to go to lab for repeat BMP   - No renal dose adjustment needed     Amlodipine 10mg  daily in the morning     Carvedilol 25mg  po twice daily with breakfast and dinner     Ensure resting 5-10 minutes before blood pressure reading     Limiting foods high in salt such as fried chicken (has about 500 mg of sodium per 3 oz serving)    11/13/23: Patient notes she is no longer going to the gym about 3-4 days per week for about 6-8 hours per week    MISC:  - Emailed the [facility] study team on 11/13/23 to see if they could reach out to [employer] to assist with BP readings transmitting to HRS. Patient notes she is taking BP readings with the new blue BP cuff but no readings are available for review in HRS.    Future considerations:  Patient does report she has clonidine patches at home. However, she does not wish to re-start these at this time  Initiating therapy such as chlorthalidone 12.5 mg daily. Discussed with Dr. Philomena Course who agrees with this plan if needed and ensuring follow-up labs. Appreciate Dr. Vanice Sarah input!      Follow-up  The patient will continue to follow up with the pharmacist. Return to pharmacist in 1 month via telephone. The return visit was scheduled during today's visit.    Subjective & Objective   At the last visit, the patient was instructed to Go to lab for repeat BMP after irbesartan re-start. The patient states they made none of the recommended changes.     10/03/23: Ms. Kaiesha notes some acid reflux symptoms after eating certain foods. She believes she is taking famotidine about an hour before food. Discussed this can be taken 10-60 minutes before food and to try taking about 30 minutes before  eating to see if this helps and please let us know if not so she can be seen in office as needed.     History:  Patient reports she did try a couple clonidine patches for a couple days and they made her feel sluggish so she has not been utilizing them for blood pressure control. She does not recall what her blood pressures were reading while utilizing the clonidine patches.     Hypertension    HPI  Current hypertension medication regimen:   Irbesartan to 150 mg daily  Amlodipine 10mg  daily (maximum dose)  Carvedilol 25mg  po twice daily (maximum dose)  Previous medications for hypertension: Hctz (nausea/vomiting), spironolactone (nausea/vomiting), hydralazine (burned stomach), clonidine (felt sluggish)    The baseline blood pressure was 143/75 on 08/31/2022.    Lifestyle  Physical activity/exercise: yes. Tries to go to the gym. Wants to go to the gym 3-4 times per week in the new year.  Adds salt to food: no. sometimes add seasoning salt  Caffeine consumption: yes. 1-2 cups of coffee each morning  Elevated stress: yes. neighbor does cause stress  Alcohol use: yes. 1 glass of wine every once in a while  Tobacco use: no    Monitoring  Patient instructed to check blood pressure 3 time(s) per week.  Cuff type: Qardio  Appropriate technique: yes    Symptom Review  Hypotension: yes. Sometimes from sitting to standing. Believes this is realted to the Vertigo.  Chest pain: no  Shortness of breath: no  Lower extremity edema: no. High blood pressure symptoms include lightheadedness, headache, change in vision    Comorbidities  ASCVD condition(s): none  CKD: yes  Diabetes: yes (type 2 and controlled)  Dyslipidemia: yes    Health Maintenance  Aspirin utilization: Patient is not taking aspirin due to the following reason(s): not indicated.  Statin utilization: Patient is not taking a statin.    Home Blood Pressure Readings:  - Please see above     Primary Care - Labs  BP Readings from Last 3 Encounters:   07/20/23 138/84   07/20/23 127/72   07/18/23 124/77     Pulse Readings from Last 3 Encounters:   07/20/23 65   07/20/23 74   07/18/23 70     Comprehensive Metabolic Profile    Lab Results   Component Value Date/Time    NA 139 07/20/2023 01:59 PM    K 4.5 07/20/2023 01:59 PM    CL 108 07/20/2023 01:59 PM    CO2 21 07/20/2023 01:59 PM    GAP 10 07/20/2023 01:59 PM    BUN 33 (H) 07/20/2023 01:59 PM    CR 1.72 (H) 07/20/2023 01:59 PM    GLU 116 (H) 07/20/2023 01:59 PM    Lab Results   Component Value Date/Time    CA 9.8 07/20/2023 01:59 PM    PO4 3.3 01/24/2023 08:44 AM    ALBUMIN 4.3 07/20/2023 01:59 PM    TOTPROT 7.3 07/20/2023 01:59 PM    ALKPHOS 84 07/20/2023 01:59 PM    AST 15 07/20/2023 01:59 PM    ALT 12 07/20/2023 01:59 PM    TOTBILI 0.6 07/20/2023 01:59 PM    GFR 33 (L) 08/24/2020 09:30 AM    GFRAA 40 (L) 08/24/2020 09:30 AM        eGFR   Date Value Ref Range Status   07/20/2023 32 (L) >60 mL/min Final     Comment:     eGFR calculated using the CKD-EPIcr_R equation  Lab Results   Component Value Date/Time    URMALBCRRAT 30.53 (H) 01/24/2023 08:49 AM     BMI Readings from Last 1 Encounters:   07/20/23 26.79 kg/m?     The 10-year ASCVD risk score (Arnett DK, et al., 2019) is: 30.4%    Values used to calculate the score:      Age: 16 years      Sex: Female      Is Non-Hispanic African American: Yes      Diabetic: Yes      Tobacco smoker: No      Systolic Blood Pressure: 138 mmHg      Is BP treated: Yes      HDL Cholesterol: 91 MG/DL      Total Cholesterol: 253 MG/DL     Medication History  Medication history was not completed because previously completed (follow-up visit).    Adverse Drug Reactions    Side effect(s) were not reported.    Refills needed: no    Home Medications    Medication Sig   ACCU-CHEK GUIDE TEST STRIPS test strip TEST BLOOD SUGAR AS DIRECTED DAILY BEFORE BREAKFAST.   ACCU-CHEK SOFTCLIX LANCETS MISC TEST BLOOD SUGAR DAILY BEFORE BREAKFAST AS DIRECTED   albuterol sulfate (PROAIR HFA) 90 mcg/actuation HFA aerosol inhaler INHALE TWO PUFFS BY MOUTH INTO THE LUNGS EVERY 6 HOURS AS NEEDED.   alcohol swabs (DROPSAFE ALCOHOL PREP PADS) USE TO CLEAN SKIN BEFORE FINGERSTICK   amLODIPine (NORVASC) 10 mg tablet TAKE 1 TABLET EVERY DAY   azelastine (ASTELIN) 137 mcg (0.1 %) nasal spray Apply two sprays to each nostril as directed twice daily.   betamethasone valerate (VALISONE) 0.1 % topical cream APPLY TOPICALLY TO AFFECTED AREA DAILY   Blood Glucose Control, Normal soln Use in glucose meter, Accu-check Aviva   Blood-Glucose Meter kit Use as directed to check sugar once a day.  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     cholecalciferol(+) (VITAMIN D-3) 2,000 unit tablet Take 1 Tab by mouth daily.  Patient taking differently: Take one tablet by mouth daily. 5,000 units daily   cyanocobalamin (vitamin B-12) 5,000 mcg cap Take 1 tablet by mouth daily.   cyclobenzaprine (FLEXERIL) 5 mg tablet TAKE 1 TABLET AT BEDTIME   ezetimibe (ZETIA) 10 mg tablet Take one tablet by mouth daily.   famotidine (PEPCID) 20 mg tablet Take one tablet by mouth twice daily.   ferrous sulfate (FEOSOL) 325 mg (65 mg iron) tablet Take one tablet by mouth every 48 hours.   fluticasone propionate (FLONASE) 50 mcg/actuation nasal spray, suspension USE 2 SPRAYS IN EACH NOSTRIL AS DIRECTED DAILY. SHAKE BOTTLE GENTLY BEFORE USING   glimepiride (AMARYL) 4 mg tablet TAKE 1 TABLET EVERY MORNING   irbesartan (AVAPRO) 150 mg tablet TAKE 1 TABLET AT BEDTIME   pioglitazone (ACTOS) 45 mg tablet TAKE 1 TABLET EVERY DAY   sodium bicarbonate 650 mg tablet Take one tablet by mouth twice daily.      Education  Education provided: yes    Hypertension  Education components: BP goal and disease risk factors and encouraged patient to monitor BP at home    Time spent with patient: 15 minutes    Thank you for allowing me to participate in the care of your patient,  Lynelle Smoke, [facility]  [facility] Primary Care Clinical Pharmacist

## 2023-12-12 ENCOUNTER — Encounter: Admit: 2023-12-12 | Discharge: 2023-12-12 | Payer: MEDICARE

## 2023-12-12 ENCOUNTER — Ambulatory Visit: Admit: 2023-12-12 | Discharge: 2023-12-13 | Payer: MEDICARE

## 2023-12-12 MED ORDER — FAMOTIDINE 20 MG PO TAB
20 mg | ORAL_TABLET | Freq: Two times a day (BID) | ORAL | 3 refills | 90.00000 days | Status: AC
Start: 2023-12-12 — End: ?

## 2023-12-12 NOTE — Telephone Encounter
 Received refill request for famotidine. Will refill per protocol.

## 2023-12-12 NOTE — Progress Notes
 A follow-up comprehensive medication management visit was completed today via telephone.    Referral reason: Hypertension Management and [facility]  Referring provider: Dr. Sherlyn Hay, MD    [facility] enrollment date: 10/02/2022  [facility] study end date: 10/02/2024  Study coordinator: Shelbie Ammons    Assessment & Plan     Hypertension  Patient's blood pressure is controlled per home readings and clinic readings. They have had a microalbumin completed. They have normal-mild albuminuria present. Their goal blood pressure is < 130/80 mmHg based on 2018 ACC/AHA guidelines.    No new BP readings as of 12/12/23. Emailed study team to help resolve this on 12/12/23.  Average of last 3 clinic BP readings in September: 129/78 mmHg    Patient Reported Home Blood Pressure Readings:  - Last reading: 124/82 mmHg     Rhonda Fletcher's average clinic blood pressures are controlled to goal.  She notes she is not going to the gym. Encouraged patient to re-start exercise. She mentioned she had an AKI the beginning of September due to food poisoning. Her creatinine on 9/20 returned to around her baseline at 1.72. She notes re-starting her irbesartan around 10/22 as BP readings were starting to increase. Asked Ms. Analie to go to lab for BMP to monitor creatinine with irbesartan re-initiation.      Plan  KEEP:  - Upcoming appointment with Dr. Philomena Course on 01/09/24  - Upcoming appointment with [facility] on 01/17/24    CONTINUE:  Checking home BP BID three times per week (in the morning before BP medications and then at 12PM or prior to bed)    Irbesartan 150 mg by mouth daily at night (re-started around 08/21/23 - stopped in September after AKI due to food poisoning)  - Dr. Philomena Course has active BMP ordered for [name] Fletcher. Asked her to go to lab for repeat BMP   - No renal dose adjustment needed     Amlodipine 10mg  daily in the morning     Carvedilol 25mg  po twice daily with breakfast and dinner     Ensure resting 5-10 minutes before blood pressure reading     Limiting foods high in salt such as fried chicken (has about 500 mg of sodium per 3 oz serving)    11/13/23: Patient notes she is no longer going to the gym about 3-4 days per week for about 6-8 hours per week    MISC:  - Emailed the [facility] study team on 11/13/23 to see if they could reach out to [place] to assist with BP readings transmitting to HRS. Patient notes she is taking BP readings with the new blue BP cuff but no readings are available for review in HRS. Emailed them again on 12/12/23.    Future considerations:  Patient does report she has clonidine patches at home. However, she does not wish to re-start these at this time  Initiating therapy such as chlorthalidone 12.5 mg daily. Discussed with Dr. Philomena Course who agrees with this plan if needed and ensuring follow-up labs. Appreciate Dr. Vanice Sarah input!      Follow-up  The patient will continue to follow up with the pharmacist. Return to pharmacist in 6 weeks via telephone. The return visit was scheduled during today's visit.    Subjective & Objective   At the last visit, the patient was instructed to Go to lab for repeat BMP after irbesartan re-start. The patient states they made none of the recommended changes.     10/03/23: [name] Fletcher notes some  acid reflux symptoms after eating certain foods. She believes she is taking famotidine about an hour before food. Discussed this can be taken 10-60 minutes before food and to try taking about 30 minutes before eating to see if this helps and please let us know if not so she can be seen in office as needed.     History:  Patient reports she did try a couple clonidine patches for a couple days and they made her feel sluggish so she has not been utilizing them for blood pressure control. She does not recall what her blood pressures were reading while utilizing the clonidine patches.     Hypertension    HPI  Current hypertension medication regimen:   Irbesartan to 150 mg daily  Amlodipine 10mg  daily (maximum dose)  Carvedilol 25mg  po twice daily (maximum dose)  Previous medications for hypertension: Hctz (nausea/vomiting), spironolactone (nausea/vomiting), hydralazine (burned stomach), clonidine (felt sluggish)    The baseline blood pressure was 143/75 on 08/31/2022.    Lifestyle  Physical activity/exercise: yes. Tries to go to the gym. Wants to go to the gym 3-4 times per week in the new year.  Adds salt to food: no. sometimes add seasoning salt  Caffeine consumption: yes. 1-2 cups of coffee each morning  Elevated stress: yes. neighbor does cause stress  Alcohol use: yes. 1 glass of wine every once in a while  Tobacco use: no    Monitoring  Patient does have access to a blood pressure monitor.  Patient instructed to check blood pressure 3 time(s) per week.  Cuff type: Qardio  Appropriate technique: yes    Symptom Review  Hypotension: no. Sometimes from sitting to standing. Believes this is realted to the Vertigo.  Chest pain: no  Shortness of breath: no  Lower extremity edema: no. High blood pressure symptoms include lightheadedness, headache, change in vision    Comorbidities  ASCVD condition(s): none  CKD: yes  Diabetes: yes (type 2 and controlled)  Dyslipidemia: yes    Health Maintenance  Aspirin utilization: Patient is not taking aspirin due to the following reason: not indicated.  Statin utilization: Patient is not taking a statin due to the following reason: patient's choice.    Home Blood Pressure Readings:  - Please see above     Primary Care - Labs  BP Readings from Last 3 Encounters:   07/20/23 138/84   07/20/23 127/72   07/18/23 124/77     Pulse Readings from Last 3 Encounters:   07/20/23 65   07/20/23 74   07/18/23 70     Comprehensive Metabolic Profile    Lab Results   Component Value Date/Time    NA 139 07/20/2023 01:59 PM    K 4.5 07/20/2023 01:59 PM    CL 108 07/20/2023 01:59 PM    CO2 21 07/20/2023 01:59 PM    GAP 10 07/20/2023 01:59 PM    BUN 33 (H) 07/20/2023 01:59 PM    CR 1.72 (H) 07/20/2023 01:59 PM    GLU 116 (H) 07/20/2023 01:59 PM    Lab Results   Component Value Date/Time    CA 9.8 07/20/2023 01:59 PM    PO4 3.3 01/24/2023 08:44 AM    ALBUMIN 4.3 07/20/2023 01:59 PM    TOTPROT 7.3 07/20/2023 01:59 PM    ALKPHOS 84 07/20/2023 01:59 PM    AST 15 07/20/2023 01:59 PM    ALT 12 07/20/2023 01:59 PM    TOTBILI 0.6 07/20/2023 01:59 PM    GFR 33 (  L) 08/24/2020 09:30 AM    GFRAA 40 (L) 08/24/2020 09:30 AM        eGFR   Date Value Ref Range Status   07/20/2023 32 (L) >60 mL/min Final     Comment:     eGFR calculated using the CKD-EPIcr_R equation     Lab Results   Component Value Date/Time    URMALBCRRAT 30.53 (H) 01/24/2023 08:49 AM     BMI Readings from Last 1 Encounters:   07/20/23 26.79 kg/m?     The 10-year ASCVD risk score (Arnett DK, et al., 2019) is: 30.4%    Values used to calculate the score:      Age: 4 years      Sex: Female      Is Non-Hispanic African American: Yes      Diabetic: Yes      Tobacco smoker: No      Systolic Blood Pressure: 138 mmHg      Is BP treated: Yes      HDL Cholesterol: 91 MG/DL      Total Cholesterol: 253 MG/DL     Medication History  Medication history was not completed because previously completed (follow-up visit).    Adverse Drug Reactions    Side effect(s) were not reported.    Refills needed: no    Home Medications    Medication Sig   ACCU-CHEK GUIDE TEST STRIPS test strip TEST BLOOD SUGAR AS DIRECTED DAILY BEFORE BREAKFAST.   ACCU-CHEK SOFTCLIX LANCETS MISC TEST BLOOD SUGAR DAILY BEFORE BREAKFAST AS DIRECTED   albuterol sulfate (PROAIR HFA) 90 mcg/actuation HFA aerosol inhaler INHALE TWO PUFFS BY MOUTH INTO THE LUNGS EVERY 6 HOURS AS NEEDED.   alcohol swabs (DROPSAFE ALCOHOL PREP PADS) USE TO CLEAN SKIN BEFORE FINGERSTICK   amLODIPine (NORVASC) 10 mg tablet TAKE 1 TABLET EVERY DAY   azelastine (ASTELIN) 137 mcg (0.1 %) nasal spray Apply two sprays to each nostril as directed twice daily.   betamethasone valerate (VALISONE) 0.1 % topical cream APPLY TOPICALLY TO AFFECTED AREA DAILY   Blood Glucose Control, Normal soln Use in glucose meter, Accu-check Aviva   Blood-Glucose Meter kit Use as directed to check sugar once a day.  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     cholecalciferol(+) (VITAMIN D-3) 2,000 unit tablet Take 1 Tab by mouth daily.  Patient taking differently: Take one tablet by mouth daily. 5,000 units daily   cyanocobalamin (vitamin B-12) 5,000 mcg cap Take 1 tablet by mouth daily.   cyclobenzaprine (FLEXERIL) 5 mg tablet TAKE 1 TABLET AT BEDTIME   ezetimibe (ZETIA) 10 mg tablet Take one tablet by mouth daily.   famotidine (PEPCID) 20 mg tablet TAKE 1 TABLET TWICE DAILY   ferrous sulfate (FEOSOL) 325 mg (65 mg iron) tablet Take one tablet by mouth every 48 hours.   fluticasone propionate (FLONASE) 50 mcg/actuation nasal spray, suspension USE 2 SPRAYS IN EACH NOSTRIL AS DIRECTED DAILY. SHAKE BOTTLE GENTLY BEFORE USING   glimepiride (AMARYL) 4 mg tablet TAKE 1 TABLET EVERY MORNING   irbesartan (AVAPRO) 150 mg tablet TAKE 1 TABLET AT BEDTIME   pioglitazone (ACTOS) 45 mg tablet TAKE 1 TABLET EVERY DAY   sodium bicarbonate 650 mg tablet Take one tablet by mouth twice daily.      Education  Education provided: yes    Hypertension  Education components: BP goal  and disease risk factors and encouraged patient to monitor BP at home    Time spent with patient: 15 minutes    Thank you for allowing me to participate in the care of your patient,  Lynelle Smoke, [facility]  [facility] Primary Care Clinical Pharmacist

## 2023-12-24 ENCOUNTER — Encounter: Admit: 2023-12-24 | Discharge: 2023-12-24 | Payer: MEDICARE

## 2023-12-24 ENCOUNTER — Ambulatory Visit: Admit: 2023-12-24 | Discharge: 2023-12-25 | Payer: MEDICARE

## 2023-12-25 ENCOUNTER — Encounter: Admit: 2023-12-25 | Discharge: 2023-12-25 | Payer: MEDICARE

## 2023-12-26 ENCOUNTER — Encounter: Admit: 2023-12-26 | Discharge: 2023-12-26 | Payer: MEDICARE

## 2023-12-30 ENCOUNTER — Encounter: Admit: 2023-12-30 | Discharge: 2023-12-30 | Payer: MEDICARE

## 2024-01-09 ENCOUNTER — Encounter: Admit: 2024-01-09 | Discharge: 2024-01-09 | Payer: MEDICARE

## 2024-01-09 ENCOUNTER — Ambulatory Visit: Admit: 2024-01-09 | Discharge: 2024-01-10 | Payer: MEDICARE

## 2024-01-09 DIAGNOSIS — N179 Acute kidney failure, unspecified: Secondary | ICD-10-CM

## 2024-01-09 DIAGNOSIS — I10 Essential (primary) hypertension: Secondary | ICD-10-CM

## 2024-01-09 DIAGNOSIS — E872 Metabolic acidosis: Secondary | ICD-10-CM

## 2024-01-09 LAB — URINALYSIS DIPSTICK
~~LOC~~ BKR GLUCOSE,UA: NEGATIVE
~~LOC~~ BKR NITRITE: NEGATIVE
~~LOC~~ BKR URINE ASCORBIC ACID, UA: NEGATIVE
~~LOC~~ BKR URINE BILE: NEGATIVE
~~LOC~~ BKR URINE BLOOD: NEGATIVE
~~LOC~~ BKR URINE KETONE: NEGATIVE

## 2024-01-09 LAB — PROTEIN/CR RATIO,UR RAN: ~~LOC~~ BKR UR TOTAL PROTEIN,RAN: 9 mg/dL

## 2024-01-09 LAB — MICROALB/CR RATIO-URINE RANDOM
~~LOC~~ BKR MICROALBUMIN, RAN: 13 g/mL (ref 1.005–1.030)
~~LOC~~ BKR MICROALBUMIN/CR RATIO URINE: 22 g/mg (ref <30.0)
~~LOC~~ BKR UR CREATININE, RAN: 61 mg/dL (ref 5.0–8.0)

## 2024-01-09 LAB — URINALYSIS, MICROSCOPIC: ~~LOC~~ BKR WBC, UA: 0 /HPF — ABNORMAL HIGH (ref <0.15)

## 2024-01-09 MED ORDER — PANTOPRAZOLE 40 MG PO TBEC
40 mg | ORAL_TABLET | Freq: Every day | ORAL | 3 refills | 90.00000 days | Status: AC
Start: 2024-01-09 — End: ?

## 2024-01-09 MED ORDER — EMPAGLIFLOZIN 10 MG PO TAB
10 mg | ORAL_TABLET | Freq: Every day | ORAL | 3 refills | Status: AC
Start: 2024-01-09 — End: ?

## 2024-01-09 NOTE — Patient Instructions
 The following are some measures which may help to slow down the progression of kidney disease:      Maintain adequate fluid intake.    Acute illnesses like the flu and COVID which may end up in hospitalization may impact on  kidney function and could cause the kidneys to fail.    It is important to keep up with  regular vaccinations for these diseases including the pneumonia vaccine as well.    If you develop diarrhea or vomiting and  are unable to keep up with your fluid intake, please report to the nearest ED since it may affect blood pressure and affect the blood flow to the kidney.    Avoid medications known as  NSAIDs which  include Advil, Motrin,Aleve, ibuprofen, naproxen.    Some medications used in patients with kidney disease including diuretics [water pill], some blood pressure medications [ACE inhibitors and ARB] like lisinopril, losartan may need to be held if you develop severe diarrhea or vomiting.  Under these conditions these medications may worsen kidney function and therefore need to be held until diarhea/vomiting resolves. If you have to hold your blood pressure or water pill medication for any of these reasons, please inform the provider who prescribed the medication.    Some medications used for stomach acid suppression  known as Proton Pump Inhibitors (which include pantoprazole, omeprazole, lansoprazole, nexium, protonix, prilosec) have been associated with kidney disease through unknown mechanisms. Unless you have a compelling indication for any of these medications , consider switching to a different class of medications known as histamine-2 receptor blockers eg. famotidine,  etc (your pharmacist or PCP can recommend a brand).    Nutrition management is a critical component in slowing kidney disease progression.  Visit the following web site for information on diet in patients with kidney disease:  https://www.kidney.org/nutrition    Visit the following website for health education videos on kidney disease:  [email or web address].kidney.[email or web address]
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
## 2024-01-10 DIAGNOSIS — N1832 Chronic kidney disease, stage 3b (HCC): Secondary | ICD-10-CM

## 2024-01-11 ENCOUNTER — Encounter: Admit: 2024-01-11 | Discharge: 2024-01-11 | Payer: MEDICARE
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
## 2024-01-23 ENCOUNTER — Encounter: Admit: 2024-01-23 | Discharge: 2024-01-23 | Payer: MEDICARE

## 2024-01-23 ENCOUNTER — Ambulatory Visit: Admit: 2024-01-23 | Discharge: 2024-01-23 | Payer: MEDICARE

## 2024-01-23 DIAGNOSIS — D125 Benign neoplasm of sigmoid colon: Secondary | ICD-10-CM

## 2024-01-23 DIAGNOSIS — D649 Anemia, unspecified: Secondary | ICD-10-CM

## 2024-01-23 DIAGNOSIS — I10 Essential (primary) hypertension: Secondary | ICD-10-CM

## 2024-01-23 DIAGNOSIS — G9050 Complex regional pain syndrome I, unspecified: Secondary | ICD-10-CM

## 2024-01-23 DIAGNOSIS — J4520 Mild intermittent asthma, uncomplicated: Secondary | ICD-10-CM

## 2024-01-23 DIAGNOSIS — F22 Delusional disorders: Secondary | ICD-10-CM

## 2024-01-23 DIAGNOSIS — E559 Vitamin D deficiency, unspecified: Secondary | ICD-10-CM

## 2024-01-23 DIAGNOSIS — E1129 Type 2 diabetes mellitus with other diabetic kidney complication: Secondary | ICD-10-CM

## 2024-01-23 DIAGNOSIS — K580 Irritable bowel syndrome with diarrhea: Secondary | ICD-10-CM

## 2024-01-23 DIAGNOSIS — E538 Deficiency of other specified B group vitamins: Secondary | ICD-10-CM

## 2024-01-23 DIAGNOSIS — K219 Gastro-esophageal reflux disease without esophagitis: Secondary | ICD-10-CM

## 2024-01-23 DIAGNOSIS — Z1211 Encounter for screening for malignant neoplasm of colon: Secondary | ICD-10-CM

## 2024-01-23 DIAGNOSIS — F5104 Psychophysiologic insomnia: Secondary | ICD-10-CM

## 2024-01-23 DIAGNOSIS — D509 Iron deficiency anemia, unspecified: Secondary | ICD-10-CM

## 2024-01-23 DIAGNOSIS — N184 Chronic kidney disease, stage 4 (severe): Secondary | ICD-10-CM

## 2024-01-23 MED ORDER — DICYCLOMINE 10 MG PO CAP
10 mg | ORAL_CAPSULE | ORAL | 0 refills | Status: AC | PRN
Start: 2024-01-23 — End: ?

## 2024-01-24 ENCOUNTER — Encounter: Admit: 2024-01-24 | Discharge: 2024-01-24 | Payer: MEDICARE

## 2024-01-29 NOTE — Progress Notes
 A follow-up comprehensive medication management visit was completed today via telephone.    Referral reason: Hypertension Management and [facility]  Referring provider: Dr. Sherlyn Hay, MD    [facility] date: 10/02/2022  [facility] study end date: 10/02/2024  Study coordinator: Shelbie Ammons    Assessment & Plan     Hypertension  Patient's blood pressure is uncontrolled per home readings. They have had a microalbumin completed. They have normal-mild albuminuria present. Their goal blood pressure is < 130/80 mmHg based on 2018 ACC/AHA guidelines.    7 day BP average: 154/85 mmHg; HR 76 bpm  30 day BP average: 152/86 mmHg; HR 75 bpm    In clinic readings however have been controlled, reviewed BP technique which may be contributing to higher values. Also not repeating BP if elevated BP discussed monitoring that as well. Chronic kidney disease may also be contributing to higher BP readings. Potential to increase irbesartan to max 300 mg daily if needed, appropriate to collaborate with nephrology to provide comprehensive collaborative care for patient.     Jardiance not started due to affordability of medications. Patient also unable stated unable to tolerate ezetimbe due to nausea/vomiting. Will discuss with provider on alternative dyslipidemia medications.    Plan  Check blood pressure 3 time(s) per week  - Repeat 5 minutes after rest if SBP > 140 mmHg or DBP > 90 mmHg  - Continue amlodipine 10 mg PO daily  - Continue carvedilol 25 mg PO BID  - Continue irbesartan 150 mg PO daily       Follow-up  The patient will continue to follow up with the pharmacist. Return to pharmacist in 4 weeks via telephone. The return visit was scheduled during today's visit.    Subjective & Objective   At the last visit, the patient was instructed to Go to lab for repeat BMP after irbesartan re-start and continue current therapies. The patient states they made all of the recommended changes. Reviewed BP technique - rest for at least 5 minutes, keeps feet flat on fee. Denies caffiene/tea before. Does not empty bladder before checking BP. Unsure if arrow lines up with brachial artery. Reviewed current medications. Not taking Jardiance due to cost > $300/month and not taking ezetimbe due to nausea/feeling sick when taking it    Hypertension    HPI  Current hypertension medication regimen:   - irbesartan to 150 mg PO daily  - amlodipine 10mg  PO daily  - carvedilol 25mg  PO BID    Previous medications for hypertension: Hctz (nausea/vomiting), spironolactone (nausea/vomiting), hydralazine (burned stomach), clonidine (felt sluggish)  Medications or disease states contributing to elevated blood pressure: CKD Stage 3    The baseline blood pressure was 143/75 on 08/31/2022.    Lifestyle  Physical activity/exercise: yes. Tries to go to the gym. Wants to go to the gym 3-4 times per week in the new year.  Adds salt to food: no. sometimes add seasoning salt  Caffeine consumption: yes. 1-2 cups of coffee each morning  Elevated stress: yes. brother recently passed away  Alcohol use: no. 1 glass of wine every once in a while  Tobacco use: no  Illicit drug use: yes. marjiuana use occassional    Monitoring  Patient instructed to check blood pressure 3 time(s) per week.  Cuff type: bicep  Appropriate technique: yes    Symptom Review  Hypotension: yes. Sometimes from sitting to standing. doesn't check BP  Chest pain: no  Shortness of breath: no  Lower extremity  edema: no  Systolic > 180 or diastolic > 161: no    Comorbidities  ASCVD condition(s): none  CKD: yes  Diabetes: yes (type 2 and controlled)  Dyslipidemia: yes    Health Maintenance  Aspirin utilization: Patient is not taking aspirin due to the following reason: not indicated.  Statin utilization: Patient is not taking a statin due to the following reason:    Home Blood Pressure Readings:          Laboratory Values       BP Readings from Last 3 Encounters:   01/23/24 122/78   01/09/24 132/83 07/20/23 138/84     Pulse Readings from Last 3 Encounters:   01/23/24 69   01/09/24 66   07/20/23 65     Comprehensive Metabolic Profile    Lab Results   Component Value Date/Time    NA 138 01/23/2024 10:48 AM    K 4.6 01/23/2024 10:48 AM    CL 107 01/23/2024 10:48 AM    GLU 183 (H) 01/23/2024 10:48 AM    BUN 30 (H) 01/23/2024 10:48 AM    CR 1.78 (H) 01/23/2024 10:48 AM    CA 8.9 01/23/2024 10:48 AM    TOTPROT 7.3 07/20/2023 01:59 PM    Lab Results   Component Value Date/Time    TOTBILI 0.6 07/20/2023 01:59 PM    ALBUMIN 4.3 07/20/2023 01:59 PM    ALKPHOS 84 07/20/2023 01:59 PM    AST 15 07/20/2023 01:59 PM    CO2 19 (L) 01/23/2024 10:48 AM    ALT 12 07/20/2023 01:59 PM    GAP 12 01/23/2024 10:48 AM    EGFR1 32 (L) 07/20/2023 01:59 PM           eGFR   Date Value Ref Range Status   07/20/2023 32 (L) >60 mL/min Final     Comment:     eGFR calculated using the CKD-EPIcr_R equation     Lab Results   Component Value Date/Time    URMALBCRRAT 22.5 01/09/2024 09:53 AM     BMI Readings from Last 1 Encounters:   01/23/24 30.02 kg/m?     The ASCVD Risk score (Arnett DK, et al., 2019) failed to calculate for the following reasons:    The valid HDL cholesterol range is 20 to 100 mg/dL     Lab Results   Component Value Date    CHOL 227 (H) 01/23/2024    TRIG 60 01/23/2024    HDL 104 01/23/2024    LDL 096 (H) 01/23/2024    VLDL 12 01/23/2024    NONHDLCHOL 123 01/23/2024    CHOLHDLC 2.5 02/21/2018        Medication History  Medication history was completed and the patient's medication list was updated to reflect what they are currently taking.    Drug-drug interactions were evaluated. There were not clinically significant drug-drug interactions.    Adverse Drug Reactions    Side effect(s) were not reported.    Refills needed: no    Home Medications    Medication Sig   ACCU-CHEK GUIDE TEST STRIPS test strip TEST BLOOD SUGAR AS DIRECTED DAILY BEFORE BREAKFAST   ACCU-CHEK SOFTCLIX LANCETS MISC TEST BLOOD SUGAR DAILY BEFORE BREAKFAST AS DIRECTED   albuterol sulfate (PROAIR HFA) 90 mcg/actuation HFA aerosol inhaler INHALE TWO PUFFS BY MOUTH INTO THE LUNGS EVERY 6 HOURS AS NEEDED.   alcohol swabs (DROPSAFE ALCOHOL PREP PADS) USE TO CLEAN SKIN BEFORE FINGERSTICK   amLODIPine (NORVASC) 10 mg tablet TAKE 1  TABLET EVERY DAY   azelastine (ASTELIN) 137 mcg (0.1 %) nasal spray Apply two sprays to each nostril as directed twice daily.   betamethasone valerate (VALISONE) 0.1 % topical cream APPLY TOPICALLY TO AFFECTED AREA DAILY   Blood Glucose Control, Normal soln Use in glucose meter, Accu-check Aviva   Blood-Glucose Meter kit Use as directed to check sugar once a day.  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     cholecalciferol(+) (VITAMIN D-3) 2,000 unit tablet Take 1 Tab by mouth daily.  Patient taking differently: Take one tablet by mouth daily. 5,000 units daily   cyanocobalamin (vitamin B-12) 5,000 mcg cap Take one capsule by mouth daily.   cyclobenzaprine (FLEXERIL) 5 mg tablet TAKE 1 TABLET AT BEDTIME   dicyclomine (BENTYL) 10 mg capsule Take one capsule by mouth every 6 hours as needed.   empagliflozin (JARDIANCE) 10 mg tablet Take one tablet by mouth daily.   ezetimibe (ZETIA) 10 mg tablet Take one tablet by mouth daily.   famotidine (PEPCID) 40 mg tablet Take one tablet by mouth twice daily.   ferrous sulfate (FEOSOL) 325 mg (65 mg iron) tablet Take one tablet by mouth every 48 hours.   fluticasone propionate (FLONASE) 50 mcg/actuation nasal spray, suspension USE 2 SPRAYS IN EACH NOSTRIL DAILY AS DIRECTED SHAKE BOTTLE GENTLY BEFORE USING   glimepiride (AMARYL) 4 mg tablet TAKE 1 TABLET EVERY MORNING   irbesartan (AVAPRO) 150 mg tablet TAKE 1 TABLET AT BEDTIME   pantoprazole DR (PROTONIX) 40 mg tablet Take one tablet by mouth daily.   pioglitazone (ACTOS) 45 mg tablet TAKE 1 TABLET EVERY DAY   sodium bicarbonate 650 mg tablet Take one tablet by mouth twice daily.      Education  Education provided: yes    Hypertension  Education components: BP goal and disease risk factors and encouraged patient to monitor BP at home    Time spent with patient: 15 minutes    Thank you for allowing me to participate in the care of your patient,  Alphia Moh, [facility]  [facility] Primary Care Clinical Pharmacist

## 2024-01-30 ENCOUNTER — Encounter: Admit: 2024-01-30 | Discharge: 2024-01-30

## 2024-01-30 ENCOUNTER — Ambulatory Visit: Admit: 2024-01-30 | Discharge: 2024-01-31

## 2024-02-27 ENCOUNTER — Ambulatory Visit: Admit: 2024-02-27 | Discharge: 2024-02-28 | Payer: MEDICARE

## 2024-02-27 ENCOUNTER — Encounter: Admit: 2024-02-27 | Discharge: 2024-02-27 | Payer: MEDICARE

## 2024-02-27 DIAGNOSIS — E785 Hyperlipidemia, unspecified: Secondary | ICD-10-CM

## 2024-02-27 MED ORDER — IRBESARTAN 300 MG PO TAB
300 mg | ORAL_TABLET | Freq: Every evening | ORAL | 3 refills | 90.00000 days | Status: AC
Start: 2024-02-27 — End: ?

## 2024-02-27 MED ORDER — EZETIMIBE 10 MG PO TAB
ORAL_TABLET | ORAL | 3 refills | 90.00000 days | Status: AC
Start: 2024-02-27 — End: ?

## 2024-02-27 NOTE — Progress Notes
 A follow-up comprehensive medication management visit was completed today via telephone.    Referral reason: Hypertension Management, Virtual [facility] and Dyslipidemia Management  Referring provider: Dr. Marcene Serve, MD    [facility] enrollment date: 10/02/2022  [facility] study end date: 10/02/2024  Study coordinator: Collie Days    Assessment & Plan     Hypertension  Patient's blood pressure is uncontrolled per home readings. They have had a microalbumin completed. They have normal-mild albuminuria present. Their goal blood pressure is < 130/80 mmHg based on 2018 ACC/AHA guidelines.    7 day BP average: 154/85 mmHg; HR 76 bpm --> 148/85 mmHg HR 73 bpm  30 day BP average: 152/86 mmHg; HR 75 bpm --> 146/82 mmHg HR 74 bpm    Since last visit BP technique improved and readings improved as well however still uncontrolled. Chronic kidney disease may also be contributing to higher BP readings. Collaborated with nephrology to increase irbesartan to max 300 mg daily and to apply for Farxiga MAP for CKD which will also have some BP lowering effects as well. Repeat BMP needed in 2-4 weeks.    Plan  Check blood pressure 3 time(s) per week  - Repeat 5 minutes after rest if SBP > 140 mmHg or DBP > 90 mmHg    - INCREASE irbesartan 300 mg PO nightly (patient to start taking 2 tablets of 150 mg this evening)  - Continue amlodipine 10 mg PO daily  - Continue carvedilol 25 mg PO BID    Dyslipidemia  The patient is not on statin therapy. This is due to statin intolerance and statin allergy.  Other lipid lowering agents: no    Therapy is indicated for primary prevention of ASCVD in adults with severe hypercholesterolemia. Patient does not have allergies to or drug interactions with statin medications. AST/ALT appropriate for statin initiation. The most recent LDL from 01/23/2024 was 106 mg/dL which is not meeting the patient's goal of > 30% to 50% reduction from baseline based on AHA/ACC guidelines. This is because the patient is non-adherent with therapy. Discussed with patient's primary care provider Macarthur Savory, APRN regarding statin non-adherence and referred patient for dyslipidemia management. Patient stopped taking ezetimibe due to nausea, however agreeable to trial lowest possible dose at bedtime for tolerability. If not tolerable discuss PCSK9 inhibitors at follow up.    Plan  - START ezetimibe 5 mg PO 3x/weekly nightly     High Cost Medication  Discussed with nephrology regarding high cost for Jardiance for CKD therapy, agreeable for me to assist in MAP referral for Farxiga which will also assist in BP lowering.    Plan  - Refer to MAP team for Farxiga      Follow-up  The patient will continue to follow up with the pharmacist. Return to pharmacist in 4 weeks via telephone. The return visit was scheduled during today's visit.    Subjective & Objective   At the last visit, the patient was instructed to continue current therapies. The patient states they made all of the recommended changes. Patient has adjusted BP technique including resting prior to taking BP and repeating 5 minutes after first reading.     Hypertension    HPI  Current hypertension medication regimen:   - irbesartan to 150 mg PO daily  - amlodipine 10mg  PO daily  - carvedilol 25mg  PO BID    Previous medications for hypertension: Hctz (nausea/vomiting), spironolactone (nausea/vomiting), hydralazine (burned stomach), clonidine (felt sluggish)  Medications or disease states contributing to  elevated blood pressure: CKD Stage 3    The baseline blood pressure was 143/75 on 08/31/2022.    Lifestyle  Physical activity/exercise: yes. Tries to go to the gym. Wants to go to the gym 3-4 times per week in the new year.  Adds salt to food: no. sometimes add seasoning salt  Caffeine consumption: yes. 1-2 cups of coffee each morning  Elevated stress: yes. brother recently passed away  Alcohol use: no. 1 glass of wine every once in a while  Tobacco use: no  Illicit drug use: yes. marjiuana use occassional    Monitoring  Patient does have access to a blood pressure monitor.  Patient instructed to check blood pressure 3 time(s) per week.  Cuff type: bicep  Appropriate technique: yes    Symptom Review  Hypotension: yes. Sometimes from sitting to standing. doesn't check BP  Chest pain: no  Shortness of breath: no  Lower extremity edema: no  Systolic > 180 or diastolic > 454: no    Dyslipidemia    HPI  Current dyslipidemia medication regimen:   None  Previous medications for dyslipidemia:   - ezetimbe 10 mg PO daily (stopped due to it making her feel ill)  - atorvastatin, lovastatin, rosuvastatin (muscle pain, dizziness)  Criteria for statin use: hyperlipidemia  Reason(s) not on a statin: allergy or intolerance to one or more statins    Comorbidities  ASCVD condition(s): none  CKD: yes  Diabetes: yes (type 2 and controlled)  Dyslipidemia: yes    Health Maintenance  Aspirin utilization: Patient is not taking aspirin due to the following reason: not indicated.  Statin utilization: Patient is not taking a statin due to the following reason:    Home Blood Pressure Readings:            Laboratory Values       BP Readings from Last 3 Encounters:   01/23/24 122/78   01/09/24 132/83   07/20/23 138/84     Pulse Readings from Last 3 Encounters:   01/23/24 69   01/09/24 66   07/20/23 65     Comprehensive Metabolic Profile    Lab Results   Component Value Date/Time    NA 138 01/23/2024 10:48 AM    K 4.6 01/23/2024 10:48 AM    CL 107 01/23/2024 10:48 AM    GLU 183 (H) 01/23/2024 10:48 AM    BUN 30 (H) 01/23/2024 10:48 AM    CR 1.78 (H) 01/23/2024 10:48 AM    CA 8.9 01/23/2024 10:48 AM    TOTPROT 7.3 07/20/2023 01:59 PM    Lab Results   Component Value Date/Time    TOTBILI 0.6 07/20/2023 01:59 PM    ALBUMIN 4.3 07/20/2023 01:59 PM    ALKPHOS 84 07/20/2023 01:59 PM    AST 15 07/20/2023 01:59 PM    CO2 19 (L) 01/23/2024 10:48 AM    ALT 12 07/20/2023 01:59 PM    GAP 12 01/23/2024 10:48 AM    EGFR1 32 (L) 07/20/2023 01:59 PM           eGFR   Date Value Ref Range Status   07/20/2023 32 (L) >60 mL/min Final     Comment:     eGFR calculated using the CKD-EPIcr_R equation     Lab Results   Component Value Date/Time    URMALBCRRAT 22.5 01/09/2024 09:53 AM     BMI Readings from Last 1 Encounters:   01/23/24 30.02 kg/m?     The ASCVD Risk score (Arnett  DK, et al., 2019) failed to calculate for the following reasons:    The valid HDL cholesterol range is 20 to 100 mg/dL     Lab Results   Component Value Date    CHOL 227 (H) 01/23/2024    TRIG 60 01/23/2024    HDL 104 01/23/2024    LDL 191 (H) 01/23/2024    VLDL 12 01/23/2024    NONHDLCHOL 123 01/23/2024    CHOLHDLC 2.5 02/21/2018        Medication History  Medication history was completed and the patient's medication list was updated to reflect what they are currently taking.    Drug-drug interactions were evaluated. There were not clinically significant drug-drug interactions.    Adverse Drug Reactions    Side effect(s) were not reported.    Refills needed: no    Home Medications    Medication Sig   ACCU-CHEK GUIDE GLUCOSE METER kit Use one strip as directed as Needed.   ACCU-CHEK GUIDE TEST STRIPS test strip TEST BLOOD SUGAR AS DIRECTED DAILY BEFORE BREAKFAST   ACCU-CHEK SOFTCLIX LANCETS MISC TEST BLOOD SUGAR DAILY BEFORE BREAKFAST AS DIRECTED   albuterol sulfate (PROAIR HFA) 90 mcg/actuation HFA aerosol inhaler INHALE TWO PUFFS BY MOUTH INTO THE LUNGS EVERY 6 HOURS AS NEEDED.   alcohol swabs (DROPSAFE ALCOHOL PREP PADS) USE TO CLEAN SKIN BEFORE FINGERSTICK   amLODIPine (NORVASC) 10 mg tablet TAKE 1 TABLET EVERY DAY   azelastine (ASTELIN) 137 mcg (0.1 %) nasal spray Apply two sprays to each nostril as directed twice daily.   betamethasone valerate (VALISONE) 0.1 % topical cream APPLY TOPICALLY TO AFFECTED AREA DAILY   Blood Glucose Control, Normal soln Use in glucose meter, Accu-check Aviva   carvediloL (COREG) 25 mg tablet TAKE 1 TABLET TWICE DAILY WITH MEALS   cetirizine (ZYRTEC) 10 mg tablet Take one tablet by mouth daily.     cholecalciferol(+) (VITAMIN D-3) 2,000 unit tablet Take 1 Tab by mouth daily.  Patient taking differently: Take one tablet by mouth daily. 5,000 units daily   cyanocobalamin (vitamin B-12) 5,000 mcg cap Take one capsule by mouth daily.   cyclobenzaprine (FLEXERIL) 5 mg tablet TAKE 1 TABLET AT BEDTIME   dicyclomine (BENTYL) 10 mg capsule Take one capsule by mouth every 6 hours as needed.   famotidine (PEPCID) 40 mg tablet Take one tablet by mouth twice daily.   ferrous sulfate (FEOSOL) 325 mg (65 mg iron) tablet Take one tablet by mouth every 48 hours.   fluticasone propionate (FLONASE) 50 mcg/actuation nasal spray, suspension USE 2 SPRAYS IN EACH NOSTRIL DAILY AS DIRECTED SHAKE BOTTLE GENTLY BEFORE USING   glimepiride (AMARYL) 4 mg tablet TAKE 1 TABLET EVERY MORNING   irbesartan (AVAPRO) 150 mg tablet TAKE 1 TABLET AT BEDTIME   pantoprazole DR (PROTONIX) 40 mg tablet Take one tablet by mouth daily.   pioglitazone (ACTOS) 45 mg tablet TAKE 1 TABLET EVERY DAY   sodium bicarbonate 650 mg tablet Take one tablet by mouth twice daily.      Education  Education provided: yes    Hypertension  Education components: BP goal and disease risk factors and encouraged patient to monitor BP at home    Dyslipidemia  Education components: benefits of lipid lowering therapy    Time spent with patient: 25 minutes      Athea Haley, PHARMD  Clinical Pharmacist - [name]   [facility]

## 2024-02-28 DIAGNOSIS — I10 Essential (primary) hypertension: Secondary | ICD-10-CM

## 2024-02-29 ENCOUNTER — Encounter: Admit: 2024-02-29 | Discharge: 2024-02-29 | Payer: MEDICARE

## 2024-02-29 NOTE — Progress Notes
Medication Assistance Program packet has been sent to patient for signatures and other required documents (if applicable) . Once received back, medication assistance coordinator will begin processing for [place].    Velvet Bathe  Medication Assistance Coordinator  [phone]

## 2024-03-05 ENCOUNTER — Encounter: Admit: 2024-03-05 | Discharge: 2024-03-05 | Payer: MEDICARE

## 2024-03-12 ENCOUNTER — Encounter: Admit: 2024-03-12 | Discharge: 2024-03-12 | Payer: MEDICARE

## 2024-03-15 ENCOUNTER — Encounter: Admit: 2024-03-15 | Discharge: 2024-03-15 | Payer: MEDICARE

## 2024-03-27 ENCOUNTER — Encounter: Admit: 2024-03-27 | Discharge: 2024-03-27 | Payer: PRIVATE HEALTH INSURANCE

## 2024-03-27 MED ORDER — AMLODIPINE 10 MG PO TAB
10 mg | ORAL_TABLET | Freq: Every day | ORAL | 3 refills | 90.00000 days | Status: AC
Start: 2024-03-27 — End: ?

## 2024-03-27 MED ORDER — DROPSAFE ALCOHOL PREP PADS TP PADM
MEDICATED_PAD | 3 refills | 100.00000 days | Status: AC
Start: 2024-03-27 — End: ?

## 2024-04-02 ENCOUNTER — Ambulatory Visit: Admit: 2024-04-02 | Discharge: 2024-04-03 | Payer: MEDICARE

## 2024-04-02 ENCOUNTER — Encounter: Admit: 2024-04-02 | Discharge: 2024-04-02 | Payer: MEDICARE

## 2024-04-02 NOTE — Progress Notes
 A follow-up comprehensive medication management visit was completed today via telephone.    Referral reason: Hypertension Management, Virtual [facility] and Dyslipidemia Management  Referring provider: Dr. Marcene Serve, MD    [facility] enrollment date: 10/02/2022  [facility] study end date: 10/02/2024  Study coordinator: Collie Days    Assessment & Plan     Hypertension  Patient's blood pressure is controlled per home readings and clinic readings. They have had a microalbumin completed. They have normal-mild albuminuria present. Their goal blood pressure is < 130/80 mmHg based on 2018 ACC/AHA guidelines.    7 day BP average: 148/85 mmHg HR 73 bpm --> 130/75 mmHg HR: 70 bpm  30 day BP average: 146/82 mmHg HR 74 bpm --> 139/78 mmHg HR; 69 bpm    Since last visit BP technique improved and readings improved since increasing irbesartan to maximum dose of 300 mg daily with collaboration of nephrology. Patient to apply for Farxiga MAP for CKD which will also have some BP lowering effects as well. Reminder to repeat BMP since increasing irbesartan.    Plan  Check blood pressure 3 time(s) per week  - Repeat 5 minutes after rest if SBP > 140 mmHg or DBP > 90 mmHg    - Continue irbesartan 300 mg PO nightly  - Continue amlodipine 10 mg PO daily  - Continue carvedilol 25 mg PO BID    - Obtain BMP     Dyslipidemia  The patient is not on statin therapy. This is due to statin intolerance and statin allergy.  Other lipid lowering agents: no    Therapy is indicated for primary prevention of ASCVD in adults with severe hypercholesterolemia. Patient does not have allergies to or drug interactions with statin medications. AST/ALT appropriate for statin initiation. The most recent LDL from 01/23/2024 was 106 mg/dL which is not meeting the patient's goal of > 30% to 50% reduction from baseline based on AHA/ACC guidelines.     Previously stopped taking ezetimibe due to nausea, since starting trial with 5 mg (1/2 tablet) three times weekly medication has been tolerable. Patient hesitant but willing to trial adding 1/2 tablet to an additional day every week with goal to take 5 mg nightly by next appointment.     Plan  - INCREASE ezetimibe by 5 mg one night each week that at follow up taking ezetimibe 5 mg PO nightly     High Cost Medication  Discussed with nephrology regarding high cost for Jardiance for CKD therapy, agreeable for me to assist in MAP referral for Farxiga which will also assist in BP lowering.    Plan  - Patient to fill out Farxiga MAP application and make photocopy and either mail/email to Med Assist team at [facility]       Follow-up  The patient will continue to follow up with the pharmacist. Return to pharmacist in 4 weeks via telephone. The return visit was scheduled during today's visit.    Subjective & Objective   At the last visit, the patient was instructed to incrase irbesartan 300 mg PO daily, start ezetimibe 5 mg PO 3x/weekly, fill out MAP packet. The patient states they made all of the recommended changes. Patient has adjusted BP technique including resting prior to taking BP and repeating 5 minutes after first reading.     Received application for Farxiga and has not yet filled it out.     ezetimibe 5 mg PO MWF. Tolerating well. Nausea one day but resolved.  Hypertension    HPI  Current hypertension medication regimen:   - irbesartan to 300 mg PO daily  - amlodipine 10mg  PO daily  - carvedilol 25mg  PO BID    Previous medications for hypertension: Hctz (nausea/vomiting), spironolactone (nausea/vomiting), hydralazine (burned stomach), clonidine (felt sluggish)  Medications or disease states contributing to elevated blood pressure: CKD Stage 3    The baseline blood pressure was 143/75 on 08/31/2022.    Lifestyle  Physical activity/exercise: yes. Tries to go to the gym. Wants to go to the gym 3-4 times per week in the new year.  Adds salt to food: no. sometimes add seasoning salt  Caffeine consumption: yes. 1 cup of coffee each morning (was 1-2)  Elevated stress: yes. brother recently passed away  Alcohol use: no. 1 glass of wine every once in a while  Tobacco use: no  Illicit drug use: yes. marjiuana use occassional    Monitoring  Patient does have access to a blood pressure monitor.  Patient instructed to check blood pressure 3 time(s) per week.  Cuff type: bicep  Appropriate technique: yes    Symptom Review  Hypotension: yes. Sometimes from sitting to standing. doesn't check BP  Chest pain: no  Shortness of breath: no  Lower extremity edema: no  Systolic > 180 or diastolic > 956: no    Dyslipidemia    HPI  Current dyslipidemia medication regimen:   - ezetimibe 5 mg PO 3x/weekly nightly   Previous medications for dyslipidemia:   - ezetimbe 10 mg PO daily (stopped due to it making her feel ill)  - atorvastatin, lovastatin, rosuvastatin (muscle pain, dizziness)  Criteria for statin use: hyperlipidemia  Reason(s) not on a statin: allergy or intolerance to one or more statins    Comorbidities  ASCVD condition(s): none  CKD: yes  Diabetes: yes (type 2 and controlled)  Dyslipidemia: yes    Health Maintenance  Aspirin utilization: Patient is not taking aspirin due to the following reason: not indicated.  Statin utilization: Patient is not taking a statin due to the following reason:    Home Blood Pressure Readings:              Laboratory Values       BP Readings from Last 3 Encounters:   01/23/24 122/78   01/09/24 132/83   07/20/23 138/84     Pulse Readings from Last 3 Encounters:   01/23/24 69   01/09/24 66   07/20/23 65     Comprehensive Metabolic Profile    Lab Results   Component Value Date/Time    NA 138 01/23/2024 10:48 AM    K 4.6 01/23/2024 10:48 AM    CL 107 01/23/2024 10:48 AM    GLU 183 (H) 01/23/2024 10:48 AM    BUN 30 (H) 01/23/2024 10:48 AM    CR 1.78 (H) 01/23/2024 10:48 AM    CA 8.9 01/23/2024 10:48 AM    TOTPROT 7.3 07/20/2023 01:59 PM    Lab Results   Component Value Date/Time    TOTBILI 0.6 07/20/2023 01:59 PM ALBUMIN 4.3 07/20/2023 01:59 PM    ALKPHOS 84 07/20/2023 01:59 PM    AST 15 07/20/2023 01:59 PM    CO2 19 (L) 01/23/2024 10:48 AM    ALT 12 07/20/2023 01:59 PM    GAP 12 01/23/2024 10:48 AM    EGFR1 32 (L) 07/20/2023 01:59 PM           eGFR   Date Value Ref Range Status   07/20/2023  32 (L) >60 mL/min Final     Comment:     eGFR calculated using the CKD-EPIcr_R equation     Lab Results   Component Value Date/Time    URMALBCRRAT 22.5 01/09/2024 09:53 AM     BMI Readings from Last 1 Encounters:   01/23/24 30.02 kg/m?     The ASCVD Risk score (Arnett DK, et al., 2019) failed to calculate for the following reasons:    The valid HDL cholesterol range is 20 to 100 mg/dL     Lab Results   Component Value Date    CHOL 227 (H) 01/23/2024    TRIG 60 01/23/2024    HDL 104 01/23/2024    LDL 161 (H) 01/23/2024    VLDL 12 01/23/2024    NONHDLCHOL 123 01/23/2024    CHOLHDLC 2.5 02/21/2018     Lab Draw:  Lab Results   Component Value Date/Time    HGBA1C 7.1 (H) 07/20/2023 01:59 PM    HGBA1C 7.0 (H) 12/26/2022 10:22 AM    HGBA1C 8.2 (H) 07/19/2022 10:40 AM     POC:  Lab Results   Component Value Date/Time    A1C 6.1 (A) 01/23/2024 12:00 AM    A1C 6.9 (A) 09/29/2021 12:00 AM         Medication History  Medication history was not completed because previously completed (follow-up visit).    Drug-drug interactions were evaluated. There were not clinically significant drug-drug interactions.    Adverse Drug Reactions    Side effect(s) were not reported.    Refills needed: no    Home Medications    Medication Sig   ACCU-CHEK GUIDE GLUCOSE METER kit Use one strip as directed as Needed.   ACCU-CHEK GUIDE TEST STRIPS test strip TEST BLOOD SUGAR AS DIRECTED DAILY BEFORE BREAKFAST   ACCU-CHEK SOFTCLIX LANCETS MISC TEST BLOOD SUGAR DAILY BEFORE BREAKFAST AS DIRECTED   albuterol sulfate (PROAIR HFA) 90 mcg/actuation HFA aerosol inhaler INHALE TWO PUFFS BY MOUTH INTO THE LUNGS EVERY 6 HOURS AS NEEDED.   alcohol swabs (DROPSAFE ALCOHOL PREP PADS) USE TO CLEAN SKIN BEFORE FINGERSTICK   amLODIPine (NORVASC) 10 mg tablet TAKE 1 TABLET EVERY DAY   azelastine (ASTELIN) 137 mcg (0.1 %) nasal spray Apply two sprays to each nostril as directed twice daily.   betamethasone valerate (VALISONE) 0.1 % topical cream APPLY TOPICALLY TO AFFECTED AREA DAILY   Blood Glucose Control, Normal soln Use in glucose meter, Accu-check Aviva   carvediloL (COREG) 25 mg tablet TAKE 1 TABLET TWICE DAILY WITH MEALS   cetirizine (ZYRTEC) 10 mg tablet Take one tablet by mouth daily.     cholecalciferol(+) (VITAMIN D-3) 2,000 unit tablet Take 1 Tab by mouth daily.  Patient taking differently: Take one tablet by mouth daily. 5,000 units daily   cyanocobalamin (vitamin B-12) 5,000 mcg cap Take one capsule by mouth daily.   cyclobenzaprine (FLEXERIL) 5 mg tablet TAKE 1 TABLET AT BEDTIME   dicyclomine (BENTYL) 10 mg capsule Take one capsule by mouth every 6 hours as needed.   ezetimibe (ZETIA) 10 mg tablet Take one-half tablet (5 mg) by mouth nightly 3x/weekly (Mon, Wed, Fri)   famotidine (PEPCID) 40 mg tablet TAKE 1 TABLET TWICE DAILY   ferrous sulfate (FEOSOL) 325 mg (65 mg iron) tablet Take one tablet by mouth every 48 hours.   fluticasone propionate (FLONASE) 50 mcg/actuation nasal spray, suspension USE 2 SPRAYS IN EACH NOSTRIL DAILY AS DIRECTED SHAKE BOTTLE GENTLY BEFORE USING   glimepiride (AMARYL) 4 mg  tablet TAKE 1 TABLET EVERY MORNING   irbesartan (AVAPRO) 300 mg tablet Take one tablet by mouth at bedtime daily.   pantoprazole DR (PROTONIX) 40 mg tablet Take one tablet by mouth daily.   pioglitazone (ACTOS) 45 mg tablet TAKE 1 TABLET EVERY DAY   sodium bicarbonate 650 mg tablet Take one tablet by mouth twice daily.      Education  Education provided: yes    Hypertension  Education components: BP goal and disease risk factors and encouraged patient to monitor BP at home    Dyslipidemia  Education components: benefits of lipid lowering therapy    Time spent with patient: 25 minutes      Azia Toutant, PHARMD  Clinical Pharmacist - [facility]   [facility]

## 2024-04-07 ENCOUNTER — Encounter: Admit: 2024-04-07 | Discharge: 2024-04-07 | Payer: MEDICARE

## 2024-04-07 ENCOUNTER — Ambulatory Visit: Admit: 2024-04-07 | Discharge: 2024-04-08 | Payer: MEDICARE

## 2024-04-10 ENCOUNTER — Encounter: Admit: 2024-04-10 | Discharge: 2024-04-10 | Payer: MEDICARE

## 2024-04-10 DIAGNOSIS — N179 Acute kidney failure, unspecified: Secondary | ICD-10-CM

## 2024-04-11 ENCOUNTER — Encounter: Admit: 2024-04-11 | Discharge: 2024-04-11 | Payer: MEDICARE | Primary: Adult Health

## 2024-04-11 NOTE — Telephone Encounter
 Instructions:  Hello [name] Section reach out to Ms Rhonda Fletcher to repeat bmp next week.  Her pharmacist will reach out to her with medication changes.Thank you   [place] [name], MD   Pt called, ID verified, relayed above message. Pt stated I just had labs done last week. Explained that kidney function labs looked worse and Dr Dunker wanted to recheck them. Agreed to lab tests, states she hasn't been drinking much water. Encouraged to drink water and reiterated need for repeat labs

## 2024-04-15 ENCOUNTER — Encounter: Admit: 2024-04-15 | Discharge: 2024-04-15 | Payer: MEDICARE | Primary: Adult Health

## 2024-04-15 ENCOUNTER — Ambulatory Visit: Admit: 2024-04-15 | Discharge: 2024-04-16 | Payer: MEDICARE | Primary: Adult Health

## 2024-04-18 ENCOUNTER — Encounter: Admit: 2024-04-18 | Discharge: 2024-04-18 | Payer: MEDICARE | Primary: Adult Health

## 2024-04-18 DIAGNOSIS — E875 Hyperkalemia: Secondary | ICD-10-CM

## 2024-04-18 DIAGNOSIS — N1832 Stage 3b chronic kidney disease (CMS-HCC): Secondary | ICD-10-CM

## 2024-04-18 NOTE — Telephone Encounter
 Instructions:   Elby Durwood LABOR, MD  L'Ec[place]er, [name], RN  Please call about symptoms and blood pressure. Durwood Elby, MD  Pt called, ID verified. Discussed concerns about labs, increasing creatinine, potassium. Replied Yeah, I saw that. Also states she's been eating lots of bananas. Asked about hydration. States, I'm not drinking enough. Asked about BP, reports last BP was 128/72 & usually runs about that when she checks it. Relayed that Dr Elby would like her to have another test to follow up and see if numbers are better. She will avoid bananas and try to be better hydrated when she gets labs drawn next week.

## 2024-04-23 ENCOUNTER — Ambulatory Visit: Admit: 2024-04-23 | Discharge: 2024-04-24 | Payer: MEDICARE | Primary: Adult Health

## 2024-04-23 ENCOUNTER — Encounter: Admit: 2024-04-23 | Discharge: 2024-04-23 | Payer: MEDICARE | Primary: Adult Health

## 2024-04-24 ENCOUNTER — Encounter: Admit: 2024-04-24 | Discharge: 2024-04-24 | Payer: MEDICARE | Primary: Adult Health

## 2024-04-30 ENCOUNTER — Ambulatory Visit: Admit: 2024-04-30 | Discharge: 2024-05-01 | Payer: MEDICARE | Primary: Adult Health

## 2024-04-30 ENCOUNTER — Encounter: Admit: 2024-04-30 | Discharge: 2024-04-30 | Payer: MEDICARE | Primary: Adult Health

## 2024-04-30 DIAGNOSIS — E785 Hyperlipidemia, unspecified: Principal | ICD-10-CM

## 2024-04-30 DIAGNOSIS — N1832 Stage 3b chronic kidney disease (CMS-HCC): Secondary | ICD-10-CM

## 2024-04-30 MED ORDER — EZETIMIBE 10 MG PO TAB
5 mg | ORAL_TABLET | Freq: Every day | ORAL | 3 refills | 90.00000 days | Status: AC
Start: 2024-04-30 — End: ?

## 2024-04-30 MED ORDER — DAPAGLIFLOZIN PROPANEDIOL 10 MG PO TAB
10 mg | ORAL_TABLET | Freq: Every day | ORAL | 3 refills | Status: CN
Start: 2024-04-30 — End: ?

## 2024-04-30 NOTE — Progress Notes
 Application for patient's Marcelline Deist has been submitted to prescriber for signatures.    Velvet Bathe  Medication Assistance Coordinator  [phone]

## 2024-04-30 NOTE — Progress Notes
 A follow-up comprehensive medication management visit was completed today via telephone.    Referral reason: Hypertension Management, [facility] and Dyslipidemia Management  Referring provider: Dr. Elenor Franco, MD    [facility] enrollment date: 10/02/2022  [facility] study end date: 10/02/2024  Study coordinator: Logan Sharps    Assessment & Plan     Hypertension  Patient's blood pressure is controlled per home readings and clinic readings. They have had a microalbumin completed. They have normal-mild albuminuria present. Their goal blood pressure is < 130/80 mmHg based on 2018 ACC/AHA guidelines.    7 day BP average: 130/75 mmHg HR: 70 bpm -->  130/72 mmHg HR: 73 bpm  30 day BP average: 139/78 mmHg HR; 69 bpm --> 132/75 mmhg HR: 72 bpm    Tolerated irbesartan  300 mg however on repeat BMP patient had hyperkalemia, hyponatremia, and Cr increase therefore was decreased back to irbesartan  150 mg daily. Since then BMP has returned to baseline and BP has remained controlled. Appropriate to continue monitoring. Pending Farxiga MAP for CKD which will also have some BP lowering effects as well.     Plan  Check blood pressure 3 time(s) per week  - Repeat 5 minutes after rest if SBP > 140 mmHg or DBP > 90 mmHg  - Check BP with hypotensive symptoms    - Continue irbesartan  150 mg PO nightly  - Continue amlodipine  10 mg PO daily  - Continue carvedilol  25 mg PO BID    Dyslipidemia  The patient is not on statin therapy. This is due to statin intolerance and statin allergy.  Other lipid lowering agents: no    Therapy is indicated for primary prevention of ASCVD in adults with severe hypercholesterolemia. Patient does not have allergies to or drug interactions with statin medications. AST/ALT appropriate for statin initiation. The most recent LDL from 01/23/2024 was 106 mg/dL which is not meeting the patient's goal of > 30% to 50% reduction from baseline based on AHA/ACC guidelines.     Previously stopped taking ezetimibe  due to nausea, since starting trial with 5 mg (1/2 tablet) three times weekly medication has been tolerable. Last month agreed to to trial adding 1/2 tablet to an additional day every week with goal to take 5 mg nightly by next appointment, however forgot, willing to trial this next month.    Plan  - INCREASE ezetimibe  by 5 mg one night each week that at follow up taking ezetimibe  5 mg PO nightly     High Cost Medication  Discussed with nephrology regarding high cost for Jardiance  for CKD therapy, agreeable for me to assist in MAP referral for Farxiga which will also assist in BP lowering. Medication assistance team received [facility] application and needed a prescription be sent to them to start the provider application for signature to send to [employer].    Plan  - Prescription for Farxiga 10 mg PO daily sent to Medication Assistance Team at [facility]       Follow-up  The patient will continue to follow up with the pharmacist. Return to pharmacist in 2 months via telephone. The return visit was scheduled during today's visit.    Subjective & Objective   At the last visit, the patient was instructed to continue irbesartan  300 mg PO daily, amlodipine  10 mg daily, and carvedilol  25 mg PO BID. Increase ezetimibe  5 mg PO daily. fill out MAP packet for [place].. The patient states they made some of the recommended changes. After pharmD visit patient  obtained BMP as directed since irbesartan  was increased and patient had Cr increase, hyperkalemia, and hyponatremia. With collaboration of nephrology irbesartan  was decreased back to 150 mg daily and patient repeated BMP until WNL.     Patient completed MAP application for Farxiga, emailed via email. MAP team requesting prescription to be sent to them to start application.    Patient did not increase ezetimibe  by adding 1 tablet weekly up to 5 mg daily and continued with ezetimibe  5 mg MWF. Agreeable to trial increased dose.    Still having some lighted headedness/dizziness when standing but BP remains WNL    Hypertension    HPI  Current hypertension medication regimen:   - irbesartan  150 mg PO daily (dose limited by BMP - 300 mg caused hyperkalemia, hyponatremia, and Cr+ increase)  - amlodipine  10mg  PO daily  - carvedilol  25mg  PO BID    Previous medications for hypertension: Hctz (nausea/vomiting), spironolactone (nausea/vomiting), hydralazine (burned stomach), clonidine  (felt sluggish)  Medications or disease states contributing to elevated blood pressure: CKD Stage 3    The baseline blood pressure was 143/75 on 08/31/2022.    Lifestyle  Physical activity/exercise: yes. moving around - going with friend to thrift stores to walk around  Adds salt to food: no. sometimes add seasoning salt  Caffeine consumption: yes. 1 cup of coffee each morning (was 1-2)  Elevated stress: yes. neighbors causing tension  Alcohol  use: no. 1 glass of wine every once in a while  Tobacco use: no  Illicit drug use: yes. marjiuana use occassional    Monitoring  Patient does have access to a blood pressure monitor.  Patient instructed to check blood pressure 3 time(s) per week.  Cuff type: bicep  Appropriate technique: yes    Symptom Review  Hypotension: yes. Sometimes from sitting to standing - checks BP and WNL  Chest pain: no  Shortness of breath: no  Lower extremity edema: no  Systolic > 180 or diastolic > 899: no    Dyslipidemia    HPI  Current dyslipidemia medication regimen:   - ezetimibe  5 mg PO nightly  Previous medications for dyslipidemia:   - ezetimbe 10 mg PO daily (stopped due to it making her feel ill)  - atorvastatin, lovastatin, rosuvastatin (muscle pain, dizziness)  Criteria for statin use: hyperlipidemia  Reason(s) not on a statin: allergy or intolerance to one or more statins    Comorbidities  ASCVD condition(s): none  CKD: yes  Diabetes: yes (type 2 and controlled)  Dyslipidemia: yes    Health Maintenance  Aspirin utilization: Patient is not taking aspirin due to the following reason: not indicated.  Statin utilization: Patient is not taking a statin due to the following reason:    Home Blood Pressure Readings:            Laboratory Values       BP Readings from Last 3 Encounters:   01/23/24 122/78   01/09/24 132/83   07/20/23 138/84     Pulse Readings from Last 3 Encounters:   01/23/24 69   01/09/24 66   07/20/23 65     Comprehensive Metabolic Profile    Lab Results   Component Value Date/Time    NA 135 (L) 04/23/2024 10:02 AM    K 4.7 04/23/2024 10:02 AM    CL 107 04/23/2024 10:02 AM    GLU 186 (H) 04/23/2024 10:02 AM    BUN 30 (H) 04/23/2024 10:02 AM    CR 1.93 (H) 04/23/2024 10:02 AM  CA 9.2 04/23/2024 10:02 AM    TOTPROT 7.3 07/20/2023 01:59 PM    Lab Results   Component Value Date/Time    TOTBILI 0.6 07/20/2023 01:59 PM    ALBUMIN 4.3 07/20/2023 01:59 PM    ALKPHOS 84 07/20/2023 01:59 PM    AST 15 07/20/2023 01:59 PM    CO2 20 (L) 04/23/2024 10:02 AM    ALT 12 07/20/2023 01:59 PM    GAP 8 04/23/2024 10:02 AM    EGFR1 32 (L) 07/20/2023 01:59 PM           eGFR   Date Value Ref Range Status   07/20/2023 32 (L) >60 mL/min Final     Comment:     eGFR calculated using the CKD-EPIcr_R equation     Lab Results   Component Value Date/Time    URMALBCRRAT 22.5 01/09/2024 09:53 AM     BMI Readings from Last 1 Encounters:   01/23/24 30.02 kg/m?     The ASCVD Risk score (Arnett DK, et al., 2019) failed to calculate for the following reasons:    The valid HDL cholesterol range is 20 to 100 mg/dL     Lab Results   Component Value Date    CHOL 227 (H) 01/23/2024    TRIG 60 01/23/2024    HDL 104 01/23/2024    LDL 893 (H) 01/23/2024    VLDL 12 01/23/2024    NONHDLCHOL 123 01/23/2024    CHOLHDLC 2.5 02/21/2018     Lab Draw:  Lab Results   Component Value Date/Time    HGBA1C 7.1 (H) 07/20/2023 01:59 PM    HGBA1C 7.0 (H) 12/26/2022 10:22 AM    HGBA1C 8.2 (H) 07/19/2022 10:40 AM     POC:  Lab Results   Component Value Date/Time    A1C 6.1 (A) 01/23/2024 12:00 AM    A1C 6.9 (A) 09/29/2021 12:00 AM         Medication History  Medication history was not completed because previously completed (follow-up visit).    Drug-drug interactions were evaluated. There were not clinically significant drug-drug interactions.    Adverse Drug Reactions    Side effect(s) were not reported.    Refills needed: yes  ezetimibe     Home Medications    Medication Sig   ACCU-CHEK GUIDE GLUCOSE METER kit Use one strip as directed as Needed.   ACCU-CHEK GUIDE TEST STRIPS test strip TEST BLOOD SUGAR AS DIRECTED DAILY BEFORE BREAKFAST   ACCU-CHEK SOFTCLIX LANCETS MISC TEST BLOOD SUGAR DAILY BEFORE BREAKFAST AS DIRECTED   albuterol  sulfate (PROAIR  HFA) 90 mcg/actuation HFA aerosol inhaler INHALE TWO PUFFS BY MOUTH INTO THE LUNGS EVERY 6 HOURS AS NEEDED.   alcohol  swabs  (DROPSAFE ALCOHOL  PREP PADS) USE TO CLEAN SKIN BEFORE FINGERSTICK   amLODIPine  (NORVASC ) 10 mg tablet TAKE 1 TABLET EVERY DAY   azelastine  (ASTELIN ) 137 mcg (0.1 %) nasal spray Apply two sprays to each nostril as directed twice daily.   betamethasone  valerate (VALISONE ) 0.1 % topical cream APPLY TOPICALLY TO AFFECTED AREA DAILY   Blood Glucose Control, Normal soln Use in glucose meter, Accu-check Aviva   carvediloL  (COREG ) 25 mg tablet TAKE 1 TABLET TWICE DAILY WITH MEALS   cetirizine (ZYRTEC) 10 mg tablet Take one tablet by mouth daily.     cholecalciferol(+) (VITAMIN D-3) 2,000 unit tablet Take 1 Tab by mouth daily.  Patient taking differently: Take one tablet by mouth daily. 5,000 units daily   cyanocobalamin (vitamin B-12) 5,000 mcg cap Take one capsule  by mouth daily.   cyclobenzaprine  (FLEXERIL ) 5 mg tablet TAKE 1 TABLET AT BEDTIME   dicyclomine  (BENTYL ) 10 mg capsule Take one capsule by mouth every 6 hours as needed.   ezetimibe  (ZETIA ) 10 mg tablet Take one-half tablet (5 mg) by mouth nightly 3x/weekly (Mon, Wed, Fri)   famotidine  (PEPCID ) 40 mg tablet TAKE 1 TABLET TWICE DAILY   ferrous sulfate (FEOSOL) 325 mg (65 mg iron ) tablet Take one tablet by mouth every 48 hours.   fluticasone  propionate (FLONASE ) 50 mcg/actuation nasal spray, suspension USE 2 SPRAYS IN EACH NOSTRIL DAILY AS DIRECTED SHAKE BOTTLE GENTLY BEFORE USING   glimepiride  (AMARYL ) 4 mg tablet TAKE 1 TABLET EVERY MORNING   irbesartan  (AVAPRO ) 150 mg tablet Take one tablet by mouth at bedtime daily. Replaces irbesartan  300 mg nightly.   pantoprazole  DR (PROTONIX ) 40 mg tablet Take one tablet by mouth daily.   pioglitazone  (ACTOS ) 45 mg tablet TAKE 1 TABLET EVERY DAY   sodium bicarbonate  650 mg tablet Take one tablet by mouth twice daily.      Education  Education provided: yes    Hypertension  Education components: BP goal and disease risk factors and encouraged patient to monitor BP at home    Dyslipidemia  Education components: benefits of lipid lowering therapy    Time spent with patient: 20 minutes      Aarvi Stotts, PHARMD  Clinical Pharmacist - [facility]   [facility]

## 2024-05-01 ENCOUNTER — Encounter: Admit: 2024-05-01 | Discharge: 2024-05-01 | Payer: MEDICARE | Primary: Adult Health

## 2024-05-06 ENCOUNTER — Encounter: Admit: 2024-05-06 | Discharge: 2024-05-06 | Payer: MEDICARE | Primary: Adult Health

## 2024-05-06 NOTE — Progress Notes
An application has been submitted to [employer] for [place].      Velvet Bathe  Medication Assistance Coordinator  [phone]

## 2024-05-15 ENCOUNTER — Encounter: Admit: 2024-05-15 | Discharge: 2024-05-15 | Payer: MEDICARE | Primary: Adult Health

## 2024-05-27 ENCOUNTER — Encounter: Admit: 2024-05-27 | Discharge: 2024-05-27 | Payer: MEDICARE | Primary: Adult Health

## 2024-05-27 NOTE — Progress Notes
 MEDICATION ASSISTANCE PROGRAM (MAP)  MANUFACTURER SUPPLIED MEDICATION    Drug: Farxiga   Manufacturer Program: [employer]  Status: Approved  Enrollment Period: 05/07/2024-10/29/2024  MAP or Drug Company to Dispense: Drug company  Refill Info: Pt to call [phone] for refills    Notes: Patient eligibility is based off insurance status and/or dx and patient must continue to meet all criteria to remain in the program. Please notify the MAP Program of any changes.     Cristino Seip  Medication Assistance Coordinator  [phone]

## 2024-06-04 ENCOUNTER — Encounter: Admit: 2024-06-04 | Discharge: 2024-06-04 | Payer: MEDICARE | Primary: Adult Health

## 2024-06-04 DIAGNOSIS — N1832 Stage 3b chronic kidney disease (CMS-HCC): Principal | ICD-10-CM

## 2024-06-08 ENCOUNTER — Encounter: Admit: 2024-06-08 | Discharge: 2024-06-08 | Payer: MEDICARE | Primary: Adult Health

## 2024-06-11 ENCOUNTER — Ambulatory Visit: Admit: 2024-06-11 | Discharge: 2024-06-11 | Payer: MEDICARE | Primary: Adult Health

## 2024-06-11 ENCOUNTER — Encounter: Admit: 2024-06-11 | Discharge: 2024-06-11 | Payer: MEDICARE | Primary: Adult Health

## 2024-06-11 DIAGNOSIS — I10 Essential (primary) hypertension: Secondary | ICD-10-CM

## 2024-06-11 DIAGNOSIS — N1832 Stage 3b chronic kidney disease (CMS-HCC): Principal | ICD-10-CM

## 2024-06-11 DIAGNOSIS — E872 Metabolic acidosis: Secondary | ICD-10-CM

## 2024-06-11 NOTE — Patient Instructions
 The following are some measures which may help to slow down the progression of kidney disease:      Maintain adequate fluid intake.    Acute illnesses like the flu and COVID which may end up in hospitalization may impact on  kidney function and could cause the kidneys to fail.    It is important to keep up with  regular vaccinations for these diseases including the pneumonia vaccine as well.    If you develop diarrhea or vomiting and  are unable to keep up with your fluid intake, please report to the nearest ED since it may affect blood pressure and affect the blood flow to the kidney.    Avoid medications known as  NSAIDs which  include Advil, Motrin,Aleve, ibuprofen, naproxen.    Some medications used in patients with kidney disease including diuretics [water pill], some blood pressure medications [ACE inhibitors and ARB] like lisinopril, losartan may need to be held if you develop severe diarrhea or vomiting.  Under these conditions these medications may worsen kidney function and therefore need to be held until diarhea/vomiting resolves. If you have to hold your blood pressure or water pill medication for any of these reasons, please inform the provider who prescribed the medication.    Some medications used for stomach acid suppression  known as Proton Pump Inhibitors (which include pantoprazole, omeprazole, lansoprazole, nexium, protonix, prilosec) have been associated with kidney disease through unknown mechanisms. Unless you have a compelling indication for any of these medications , consider switching to a different class of medications known as histamine-2 receptor blockers eg. famotidine,  etc (your pharmacist or PCP can recommend a brand).    Nutrition management is a critical component in slowing kidney disease progression.  Visit the following web site for information on diet in patients with kidney disease:  https://www.kidney.org/nutrition    Visit the following website for health education videos on kidney disease:  [email or web address].kidney.[email or web address]
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
## 2024-06-13 ENCOUNTER — Encounter: Admit: 2024-06-13 | Discharge: 2024-06-13 | Payer: MEDICARE | Primary: Adult Health

## 2024-06-16 ENCOUNTER — Encounter: Admit: 2024-06-16 | Discharge: 2024-06-16 | Payer: MEDICARE | Primary: Adult Health

## 2024-06-16 NOTE — Telephone Encounter
 In MyChart message, pt wrote: Dr. Adomako the medication farxiga . Make my private hurt. I stop taking it. Dr Yancy question: Do you have pain when you pee and are you urinating quite often? If yes, we will obtain urine studies to rule out an infection. Thank you Rhonda Fletcher [name], MD   Message sent to pt at 1225 but unread by 1515. Pt called, ID verified. States from 1st pill, began to tingling then pain after 2nd pill. Stopped taking it and pain subsided. She states she looked up the side effects and decided not to take anymore. Denies pain now, no urinary tract symptoms currently. She states I just wanted you to know I wasn't taking it.

## 2024-06-24 NOTE — Progress Notes
 A follow-up comprehensive medication management visit was completed today via telephone.    Referral reason: Hypertension Management, [facility] and Dyslipidemia Management  Referring provider: Dr. Elenor Franco, MD    [facility] enrollment date: 10/02/2022  [facility] study end date: 10/02/2024  Study coordinator: Logan Sharps    Assessment & Plan     Diabetes  Patient's diabetes is uncontrolled as reflected by glucose (223 mg/dL) 1/86/74. Their goal glucose is pre-prandial 80-150 and post-prandial <200. Their most recent A1C was 6.1% on 01/23/2024 with a goal of < 8% based on ADA guidelines.    Although A1c controlled most recent CMP demonstrating possibility of uncontrolled diabetes. Patient continues taking current therapies daily without missed doses however without proper self monitoring at home with non-match meter and test strips. Appropriate to send updated meter to match testing supplies. Discussed purchasing OTC meter and supplies if not covered as may have received in last year. Appropriate to continue therapy and increase monitoring if adjustment needed in the future.    Started Farxiga  with collaboration of nephrology for CKD/DM and BP effects however burning in vagina present - possibly due to increased glucoses. Requested patient hold until begins monitoring, may be able to restart once glucoses more controlled.    Plan  - START testing FBG 3x weekly and PP/random 3x/weekly  - Continue glimepiride  4 mg PO daily (AM)  - Continue pioglitazone  45 mg PO nightly     - Farxiga  10 mg (MAP) - HOLDING    Hypertension  Patient's blood pressure is controlled per home readings and clinic readings. They have had a microalbumin completed. They have normal-mild albuminuria present. Their goal blood pressure is < 130/80 mmHg based on 2018 ACC/AHA guidelines.    7 day BP average: 130/72 mmHg HR: 73 bpm --> 116/67 mmHg HR: 69 bpm  30 day BP average: 132/75 mmhg HR: 72 bpm --> 126/70 mmHg HR: 67 bpm    Controlled on current therapy. Irbesartan  dose limited (previously increased to 300 mg and BMP - Cr and K+ spike). Appropriate to continue current therapy and monitor.     Plan  Check blood pressure 3 time(s) per week    - Continue irbesartan  150 mg PO nightly  - Continue amlodipine  10 mg PO daily  - Continue carvedilol  25 mg PO BID    Dyslipidemia  The patient is not on statin therapy. This is due to statin intolerance and statin allergy.  Other lipid lowering agents: no    Therapy is indicated for primary prevention of ASCVD in adults with severe hypercholesterolemia. Patient does not have allergies to or drug interactions with statin medications. AST/ALT appropriate for statin initiation. The most recent LDL from 01/23/2024 was 106 mg/dL which is not meeting the patient's goal of > 30% to 50% reduction from baseline based on AHA/ACC guidelines.     Previously stopped taking ezetimibe  due to nausea, since starting trial with 5 mg (1/2 tablet) three times weekly medication has been tolerable. Tried increasing to 5 days a week however side effects present. Appropriate to decrease back to MWF dosing and continue at max tolerability. May have potential to increase in the future however glucoses right now priority of control.    Plan  - DECREASE ezetimibe  by 5 mg MWF       Follow-up  The patient will continue to follow up with the pharmacist. Return to pharmacist in 3 weeks via telephone. The return visit was scheduled during today's visit.    Subjective &  Objective   At the last visit, the patient was instructed to continue irbesartan  150 mg PO daily, amlodipine  10 mg daily, and carvedilol  25 mg PO BID. Increase ezetimibe  5 mg PO daily.. The patient states they made all of the recommended changes.     HTN:   Continued current therapy. Still having some lighted headedness/dizziness when standing but BP remains WNL    Farxiga  10 mg started but burning with urination present and stopped.     DM:  Glucose on BMP 223 mg/dL 1/86/74 and discussed with Odella Shines, APRN-NP to consult/assist in diabetes management as well to HTN ([facility]) and dyslipidemia.   Patient has not been able to check glucoses at home. Meter is unknown brand and is strips are Accu-Check Guide but don't fit into meter. Reports taking current medications. Confirmed bottle of pioglitazone  from February 2025 is what she is currently taking at bedtime and takes glimepiride  4 mg in the morning.    Dyslipidemia:   Patient tried increasing ezetimibe  to 5 mg (1/2 tablet) Monday through Friday and experiencing some fatigue.     Diabetes    HPI  Current diabetes medication regimen:   - glimepiride  4 mg PO daily (AM)  - pioglitazone  45 mg PO nightly   Previous medications for diabetes:   - metformin  (creatinine spike)   CGM utilization: n/a    Blood Glucose  Hypoglycemia: Patient reports no episodes of hypoglycemia.  Monitoring times: patient not consistently checking glucose  The baseline HbA1C was 6.1 on 01/23/2024.    Diet and Exercise  Number of meals per day: 2  Breakfast: bacon or hot dog with egg with toast or sometimes skips  Lunch: skips unless eats breakfast  Dinner: chicken, sandwich, or french fries, no vegetables/fruit  Snacks: banana (twice daily), grape  Drinks: water (2-3 - 20 oz  bottles), coffee (2-3 cups) w/ creamer    Hypertension    HPI  Current hypertension medication regimen:   - irbesartan  150 mg PO daily (dose limited by BMP - 300 mg caused hyperkalemia, hyponatremia, and Cr+ increase)  - amlodipine  10mg  PO daily  - carvedilol  25mg  PO BID    Previous medications for hypertension: Hctz (nausea/vomiting), spironolactone (nausea/vomiting), hydralazine (burned stomach), clonidine  (felt sluggish)  Medications or disease states contributing to elevated blood pressure: CKD Stage 3    The baseline blood pressure was 143/75 on 08/31/2022.    Lifestyle  Physical activity/exercise: yes. moving around - going with friend to thrift stores to walk around  Adds salt to food: no. sometimes add seasoning salt  Caffeine consumption: yes  Elevated stress: yes. neighbors causing tension  Alcohol  use: no. 1 glass of wine every once in a while  Tobacco use: no  Illicit drug use: yes. marjiuana use occassional    Monitoring  Patient does have access to a blood pressure monitor.  Patient instructed to check blood pressure 3 time(s) per week.  Cuff type: bicep  Appropriate technique: yes    Symptom Review  Hypotension: yes. Sometimes from sitting to standing - checks BP and WNL  Chest pain: no  Shortness of breath: no  Lower extremity edema: no  Systolic > 180 or diastolic > 899: no    Dyslipidemia    HPI  Current dyslipidemia medication regimen:   - ezetimibe  5 mg PO nightly          Currently taking Mon- Fri   Previous medications for dyslipidemia:   - ezetimbe 10 mg PO daily (stopped due to  it making her feel ill)  - atorvastatin, lovastatin, rosuvastatin (muscle pain, dizziness)  Criteria for statin use: hyperlipidemia  Reason(s) not on a statin: allergy or intolerance to one or more statins    Comorbidities  ASCVD condition(s): none  CKD: yes  Diabetes: yes (type 2 and controlled)  Dyslipidemia: yes    Health Maintenance  Aspirin utilization: Patient is not taking aspirin due to the following reason: not indicated.  Statin utilization: Patient is not taking a statin due to the following reason:    Home Blood Pressure Readings:              Laboratory Values     BP Readings from Last 3 Encounters:   06/11/24 (P) 126/65   01/23/24 122/78   01/09/24 132/83     Pulse Readings from Last 3 Encounters:   06/11/24 (P) 61   01/23/24 69   01/09/24 66     Comprehensive Metabolic Profile    Lab Results   Component Value Date/Time    NA 134 (L) 06/11/2024 10:00 AM    K 5.1 06/11/2024 10:00 AM    CL 103 06/11/2024 10:00 AM    GLU 223 (H) 06/11/2024 10:00 AM    BUN 24 06/11/2024 10:00 AM    CR 1.86 (H) 06/11/2024 10:00 AM    CA 9.5 06/11/2024 10:00 AM    TOTPROT 7.3 07/20/2023 01:59 PM    Lab Results Component Value Date/Time    TOTBILI 0.6 07/20/2023 01:59 PM    ALBUMIN 4.3 07/20/2023 01:59 PM    ALKPHOS 84 07/20/2023 01:59 PM    AST 15 07/20/2023 01:59 PM    CO2 23 06/11/2024 10:00 AM    ALT 12 07/20/2023 01:59 PM    GAP 8 06/11/2024 10:00 AM    EGFR1 32 (L) 07/20/2023 01:59 PM           eGFR   Date Value Ref Range Status   07/20/2023 32 (L) >60 mL/min Final     Comment:     eGFR calculated using the CKD-EPIcr_R equation     Lab Results   Component Value Date/Time    URMALBCRRAT 22.5 01/09/2024 09:53 AM     BMI Readings from Last 1 Encounters:   06/11/24 29.92 kg/m?     The ASCVD Risk score (Arnett DK, et al., 2019) failed to calculate for the following reasons:    The valid HDL cholesterol range is 20 to 100 mg/dL     Lab Results   Component Value Date    CHOL 227 (H) 01/23/2024    TRIG 60 01/23/2024    HDL 104 01/23/2024    LDL 893 (H) 01/23/2024    VLDL 12 01/23/2024    NONHDLCHOL 123 01/23/2024    CHOLHDLC 2.5 02/21/2018     Lab Draw:  Lab Results   Component Value Date/Time    HGBA1C 7.1 (H) 07/20/2023 01:59 PM    HGBA1C 7.0 (H) 12/26/2022 10:22 AM    HGBA1C 8.2 (H) 07/19/2022 10:40 AM     POC:  Lab Results   Component Value Date/Time    A1C 6.1 (A) 01/23/2024 12:00 AM    A1C 6.9 (A) 09/29/2021 12:00 AM         Medication History  Medication history was not completed because previously completed (follow-up visit).    Drug-drug interactions were evaluated. There were not clinically significant drug-drug interactions.    Adverse Drug Reactions    The following side effects were reported: Muscle ache/fatigue  from ezetimibe  - no fatigue noted at dose MWF  Jardiance  - burning in vagina     The following strategies were discussed with the patient to mitigate the side effect(s): therapy changes  Changes to therapy: modification and discontinue (dose)    Refills needed: no  Home Medications   Medication Sig   ACCU-CHEK GUIDE GLUCOSE METER kit Use one strip as directed as Needed.   ACCU-CHEK GUIDE TEST STRIPS test strip TEST BLOOD SUGAR AS DIRECTED DAILY BEFORE BREAKFAST   ACCU-CHEK SOFTCLIX LANCETS MISC TEST BLOOD SUGAR DAILY BEFORE BREAKFAST AS DIRECTED   albuterol  sulfate (PROAIR  HFA) 90 mcg/actuation HFA aerosol inhaler INHALE TWO PUFFS BY MOUTH INTO THE LUNGS EVERY 6 HOURS AS NEEDED.   alcohol  swabs  (DROPSAFE ALCOHOL  PREP PADS) USE TO CLEAN SKIN BEFORE FINGERSTICK   amLODIPine  (NORVASC ) 10 mg tablet TAKE 1 TABLET EVERY DAY   azelastine  (ASTELIN ) 137 mcg (0.1 %) nasal spray Apply two sprays to each nostril as directed twice daily.   betamethasone  valerate (VALISONE ) 0.1 % topical cream APPLY TOPICALLY TO AFFECTED AREA DAILY   Blood Glucose Control, Normal soln Use in glucose meter, Accu-check [employer]iva   carvediloL  (COREG ) 25 mg tablet TAKE 1 TABLET TWICE DAILY WITH MEALS   cetirizine (ZYRTEC) 10 mg tablet Take one tablet by mouth daily.     cholecalciferol(+) (VITAMIN D-3) 2,000 unit tablet Take 1 Tab by mouth daily.  Patient taking differently: Take one tablet by mouth daily. 5,000 units daily   cyanocobalamin (vitamin B-12) 5,000 mcg cap Take one capsule by mouth daily.   cyclobenzaprine  (FLEXERIL ) 5 mg tablet TAKE 1 TABLET AT BEDTIME   dapagliflozin  propanediol (FARXIGA ) 10 mg tablet Take one tablet by mouth daily. Indications: decreased kidney function, type 2 diabetes mellitus   dicyclomine  (BENTYL ) 10 mg capsule Take one capsule by mouth every 6 hours as needed.   ezetimibe  (ZETIA ) 10 mg tablet Take one-half tablet by mouth daily.   famotidine  (PEPCID ) 40 mg tablet TAKE 1 TABLET TWICE DAILY   ferrous sulfate (FEOSOL) 325 mg (65 mg iron ) tablet Take one tablet by mouth every 48 hours.   fluticasone  propionate (FLONASE ) 50 mcg/actuation nasal spray, suspension USE 2 SPRAYS IN EACH NOSTRIL DAILY AS DIRECTED SHAKE BOTTLE GENTLY BEFORE USING   glimepiride  (AMARYL ) 4 mg tablet TAKE 1 TABLET EVERY MORNING   irbesartan  (AVAPRO ) 150 mg tablet Take one tablet by mouth at bedtime daily. Replaces irbesartan  300 mg nightly. pantoprazole  DR (PROTONIX ) 40 mg tablet Take one tablet by mouth daily.   pioglitazone  (ACTOS ) 45 mg tablet TAKE 1 TABLET EVERY DAY   sodium bicarbonate  650 mg tablet Take one tablet by mouth twice daily.      Education  Education provided: yes    Diabetes  Education components: medication adherence strategies  SMBG    Hypertension  Education components: BP goal and disease risk factors and encouraged patient to monitor BP at home    Dyslipidemia  Education components: benefits of lipid lowering therapy    Time spent with patient: 30 minutes    Charlissa Petros, PHARMD  Clinical Pharmacist - [facility]   [facility]

## 2024-06-25 ENCOUNTER — Encounter: Admit: 2024-06-25 | Discharge: 2024-06-25 | Payer: MEDICARE | Primary: Adult Health

## 2024-06-25 ENCOUNTER — Ambulatory Visit: Admit: 2024-06-25 | Discharge: 2024-06-26 | Payer: MEDICARE | Primary: Adult Health

## 2024-06-25 DIAGNOSIS — E1129 Type 2 diabetes mellitus with other diabetic kidney complication: Principal | ICD-10-CM

## 2024-06-25 DIAGNOSIS — E785 Hyperlipidemia, unspecified: Secondary | ICD-10-CM

## 2024-06-25 DIAGNOSIS — I10 Essential (primary) hypertension: Secondary | ICD-10-CM

## 2024-06-25 MED ORDER — BLOOD-GLUCOSE METER MISC MISC
1 | Freq: Before meals | 0 refills | Status: AC
Start: 2024-06-25 — End: ?

## 2024-06-26 DIAGNOSIS — R809 Proteinuria, unspecified: Secondary | ICD-10-CM

## 2024-07-09 NOTE — Progress Notes
 A follow-up comprehensive medication management visit was completed today via telephone.    Referral reason: Hypertension Management, Virtual [facility], Dyslipidemia Management and Diabetes Management  Referring provider: [place]    [id number] [facility] enrollment date: 10/02/2022  [facility] study end date: 10/02/2024  Study coordinator: Logan Sharps    Assessment & Plan     Diabetes  Patient's diabetes is uncontrolled as reflected by glucose (223 mg/dL) 1/86/74. Their goal glucose is pre-prandial 80-150 and post-prandial <200. Their most recent A1C was 6.1% on 01/23/2024 with a goal of < 8% based on ADA guidelines.    Although A1c controlled most recent CMP demonstrating possibility of uncontrolled diabetes. Patient now has proper self monitoring supplies for remote monitoring. Reported glucose of 95 mg/dL 0/89/74, appropriate to continue therapy and increase monitoring if adjustment needed in the future.    Started Farxiga  with collaboration of nephrology for CKD/DM and BP effects however urinary symptoms present. Holding off to the side may be appropriate to restart in the future. Increased stinging with urination not resolved appropriate to collaborate with PCP if urinalysis indicated.     Plan  - START testing FBG 3x weekly and PP/random 3x/weekly  - CHECK BG and BP during episodes of lightheadedness/dizziness   - Continue glimepiride  4 mg PO daily (AM)  - Continue pioglitazone  45 mg PO nightly     - Farxiga  10 mg (MAP) - HOLDING    Hypertension  Patient's blood pressure is controlled per home readings and clinic readings. They have had a microalbumin completed. They have normal-mild albuminuria present. Their goal blood pressure is < 130/80 mmHg based on 2018 ACC/AHA guidelines.    7 day BP average: 116/67 mmHg HR: 69 bpm --> 113/66 mmHg HR: 72 bpm  30 day BP average: 126/70 mmHg HR: 67 bpm --> 120/70 mmHg HR: 71 bpm    Controlled on current therapy. Irbesartan  dose limited (previously increased to 300 mg and BMP - Cr and K+ spike). Appropriate to continue current therapy and monitor.     Plan  Check blood pressure 3 time(s) per week    - Continue irbesartan  150 mg PO nightly  - Continue amlodipine  10 mg PO daily  - Continue carvedilol  25 mg PO BID    Dyslipidemia  The patient is not on statin therapy. This is due to statin intolerance and statin allergy.  Other lipid lowering agents: no    Therapy is indicated for primary prevention of ASCVD in adults with severe hypercholesterolemia. Patient does not have allergies to or drug interactions with statin medications. AST/ALT appropriate for statin initiation. The most recent LDL from 01/23/2024 was 106 mg/dL which is not meeting the patient's goal of > 30% to 50% reduction from baseline based on AHA/ACC guidelines. Nonadherence present due to previous noted muscle aches, patient having some dizziness/lightheadedness appropriate to hold for time being     Plan  - HOLD ezetimibe  by 5 mg MWF       Follow-up  The patient will continue to follow up with the pharmacist. Return to pharmacist in 4 weeks via telephone. The return visit was scheduled during today's visit.    Subjective & Objective   At the last visit, the patient was instructed to continue irbesartan  150 mg PO daily, amlodipine  10 mg daily, and carvedilol  25 mg PO BID. Increase ezetimibe  5 mg PO daily.. The patient states they made some of the recommended changes. Stopped ezetimibe  due to not feeling good. Does not want to take  right now    Stopped Farxiga  due to urinary sx and those have not improved. Stinging when urinating, drinking a lot more water but urination has increased, denies confusion/fogginess or increased urge    DM:   Now has matching testing supplies and glucometer. Accu-Check meter shipped and received in the mail a few days ago BG 95 mg/dL yesterday AM.     Feeling lightheaded/dizzy around 3-4pm but doesn't check BP or glucoses on occurrence. Lasts about 30 seconds. Eats around 2pm and drinking water throughout day    HX:   DM:  Glucose on BMP 223 mg/dL 1/86/74 and discussed with Odella Shines, APRN-NP to consult/assist in diabetes management as well to HTN (v[facility]) and dyslipidemia.     Diabetes    HPI  Current diabetes medication regimen:   - glimepiride  4 mg PO daily (AM)  - pioglitazone  45 mg PO nightly     Previous medications for diabetes:   - metformin  (creatinine spike)   CGM utilization: n/a    Blood Glucose  Hypoglycemia: Patient reports no episodes of hypoglycemia.  Monitoring times: patient not consistently checking glucose  The baseline HbA1C was 6.1 on 01/23/2024.    Diet and Exercise  Number of meals per day: 2  Breakfast: bacon or hot dog with egg with toast or sometimes skips  Lunch: skips unless eats breakfast  Dinner: chicken, sandwich, or french fries, no vegetables/fruit  Snacks: banana (twice daily), grape  Drinks: water (2-3 - 20 oz  bottles), coffee (2-3 cups) w/ creamer    Hypertension    HPI  Current hypertension medication regimen:   - irbesartan  150 mg PO daily (dose limited by BMP - 300 mg caused hyperkalemia, hyponatremia, and Cr+ increase)  - amlodipine  10mg  PO daily  - carvedilol  25mg  PO BID    Previous medications for hypertension: Hctz (nausea/vomiting), spironolactone (nausea/vomiting), hydralazine (burned stomach), clonidine  (felt sluggish)  Medications or disease states contributing to elevated blood pressure: CKD Stage 3    The baseline blood pressure was 143/75 on 08/31/2022.    Lifestyle  Physical activity/exercise: yes. moving around - going with friend to thrift stores to walk around  Adds salt to food: no. sometimes add seasoning salt  Caffeine consumption: yes  Elevated stress: yes. neighbors causing tension  Alcohol  use: no. 1 glass of wine every once in a while  Tobacco use: no  Illicit drug use: yes. marjiuana use occassional    Monitoring  Patient does have access to a blood pressure monitor.  Patient instructed to check blood pressure 3 time(s) per week.  Cuff type: bicep  Appropriate technique: yes    Symptom Review  Hypotension: yes. Sometimes from sitting to standing - checks BP and WNL  Chest pain: no  Shortness of breath: no  Lower extremity edema: no  Systolic > 180 or diastolic > 899: no    Dyslipidemia    HPI  Current dyslipidemia medication regimen:   - ezetimibe  5 mg PO nightly (not taking because not feeling well)   Previous medications for dyslipidemia:   - ezetimbe 10 mg PO daily (stopped due to it making her feel ill)  - atorvastatin, lovastatin, rosuvastatin (muscle pain, dizziness)  Criteria for statin use: hyperlipidemia  Reason(s) not on a statin: allergy or intolerance to one or more statins    Comorbidities  ASCVD condition(s): none  CKD: yes  Diabetes: yes (type 2 and controlled)  Dyslipidemia: yes    Health Maintenance  Aspirin utilization: Patient is not taking aspirin  due to the following reason: not indicated.  Statin utilization: Patient is not taking a statin due to the following reason:    Home Blood Pressure Readings:                Laboratory Values     BP Readings from Last 3 Encounters:   06/11/24 (P) 126/65   01/23/24 122/78   01/09/24 132/83     Pulse Readings from Last 3 Encounters:   06/11/24 (P) 61   01/23/24 69   01/09/24 66     Comprehensive Metabolic Profile    Lab Results   Component Value Date/Time    NA 134 (L) 06/11/2024 10:00 AM    K 5.1 06/11/2024 10:00 AM    CL 103 06/11/2024 10:00 AM    GLU 223 (H) 06/11/2024 10:00 AM    BUN 24 06/11/2024 10:00 AM    CR 1.86 (H) 06/11/2024 10:00 AM    CA 9.5 06/11/2024 10:00 AM    TOTPROT 7.3 07/20/2023 01:59 PM    Lab Results   Component Value Date/Time    TOTBILI 0.6 07/20/2023 01:59 PM    ALBUMIN 4.3 07/20/2023 01:59 PM    ALKPHOS 84 07/20/2023 01:59 PM    AST 15 07/20/2023 01:59 PM    CO2 23 06/11/2024 10:00 AM    ALT 12 07/20/2023 01:59 PM    GAP 8 06/11/2024 10:00 AM    EGFR1 32 (L) 07/20/2023 01:59 PM           eGFR   Date Value Ref Range Status   07/20/2023 32 (L) >60 mL/min Final     Comment:     eGFR calculated using the CKD-EPIcr_R equation     Lab Results   Component Value Date/Time    URMALBCRRAT 22.5 01/09/2024 09:53 AM     BMI Readings from Last 1 Encounters:   06/11/24 29.92 kg/m?     The ASCVD Risk score (Arnett DK, et al., 2019) failed to calculate for the following reasons:    The valid HDL cholesterol range is 20 to 100 mg/dL     Lab Results   Component Value Date    CHOL 227 (H) 01/23/2024    TRIG 60 01/23/2024    HDL 104 01/23/2024    LDL 893 (H) 01/23/2024    VLDL 12 01/23/2024    NONHDLCHOL 123 01/23/2024    CHOLHDLC 2.5 02/21/2018     Lab Draw:  Lab Results   Component Value Date/Time    HGBA1C 7.1 (H) 07/20/2023 01:59 PM    HGBA1C 7.0 (H) 12/26/2022 10:22 AM    HGBA1C 8.2 (H) 07/19/2022 10:40 AM     POC:  Lab Results   Component Value Date/Time    A1C 6.1 (A) 01/23/2024 12:00 AM    A1C 6.9 (A) 09/29/2021 12:00 AM         Medication History  Medication history was not completed because previously completed (follow-up visit).    Drug-drug interactions were evaluated. There were not clinically significant drug-drug interactions.    Adverse Drug Reactions    The following side effects were reported: Ezetimibe  - stopped due to not feeling well     The following strategies were discussed with the patient to mitigate the side effect(s): therapy changes  Changes to therapy: additional monitoring  hold for now    Refills needed: no  Home Medications   Medication Sig   ACCU-CHEK GUIDE TEST STRIPS test strip TEST BLOOD SUGAR AS DIRECTED DAILY BEFORE  BREAKFAST   ACCU-CHEK SOFTCLIX LANCETS MISC TEST BLOOD SUGAR DAILY BEFORE BREAKFAST AS DIRECTED   albuterol  sulfate (PROAIR  HFA) 90 mcg/actuation HFA aerosol inhaler INHALE TWO PUFFS BY MOUTH INTO THE LUNGS EVERY 6 HOURS AS NEEDED.   alcohol  swabs  (DROPSAFE ALCOHOL  PREP PADS) USE TO CLEAN SKIN BEFORE FINGERSTICK   amLODIPine  (NORVASC ) 10 mg tablet TAKE 1 TABLET EVERY DAY   azelastine  (ASTELIN ) 137 mcg (0.1 %) nasal spray Apply two sprays to each nostril as directed twice daily.   betamethasone  valerate (VALISONE ) 0.1 % topical cream APPLY TOPICALLY TO AFFECTED AREA DAILY   Blood Glucose Control, Normal soln Use in glucose meter, Accu-check [employer]   blood-glucose meter (ACCU-CHEK GUIDE GLUCOSE METER) kit Use one strip as directed before meals and at bedtime. Diagnosis Code: 11.29  Indications: type 2 diabetes mellitus   carvediloL  (COREG ) 25 mg tablet TAKE 1 TABLET TWICE DAILY WITH MEALS   cetirizine (ZYRTEC) 10 mg tablet Take one tablet by mouth daily.     cholecalciferol(+) (VITAMIN D-3) 2,000 unit tablet Take 1 Tab by mouth daily.  Patient taking differently: Take one tablet by mouth daily. 5,000 units daily   cyanocobalamin (vitamin B-12) 5,000 mcg cap Take one capsule by mouth daily.   cyclobenzaprine  (FLEXERIL ) 5 mg tablet TAKE 1 TABLET AT BEDTIME   dicyclomine  (BENTYL ) 10 mg capsule Take one capsule by mouth every 6 hours as needed.   ezetimibe  (ZETIA ) 10 mg tablet Take one-half tablet by mouth daily.   famotidine  (PEPCID ) 40 mg tablet TAKE 1 TABLET TWICE DAILY   ferrous sulfate (FEOSOL) 325 mg (65 mg iron ) tablet Take one tablet by mouth every 48 hours.   fluticasone  propionate (FLONASE ) 50 mcg/actuation nasal spray, suspension USE 2 SPRAYS IN EACH NOSTRIL DAILY AS DIRECTED SHAKE BOTTLE GENTLY BEFORE USING   glimepiride  (AMARYL ) 4 mg tablet TAKE 1 TABLET EVERY MORNING   irbesartan  (AVAPRO ) 150 mg tablet Take one tablet by mouth at bedtime daily. Replaces irbesartan  300 mg nightly.   pantoprazole  DR (PROTONIX ) 40 mg tablet Take one tablet by mouth daily.   pioglitazone  (ACTOS ) 45 mg tablet TAKE 1 TABLET EVERY DAY   sodium bicarbonate  650 mg tablet Take one tablet by mouth twice daily.      Education  Education provided: yes    Diabetes  Education components: medication adherence strategies  SMBG    Hypertension  Education components: BP goal and disease risk factors and encouraged patient to monitor BP at home    Dyslipidemia  Education components: benefits of lipid lowering therapy    Time spent with patient: 15 minutes    Elie Leppo, PHARMD  Clinical Pharmacist - [facility]   [facility]

## 2024-07-10 ENCOUNTER — Ambulatory Visit: Admit: 2024-07-10 | Discharge: 2024-07-11 | Payer: MEDICARE | Primary: Adult Health

## 2024-07-10 ENCOUNTER — Encounter: Admit: 2024-07-10 | Discharge: 2024-07-10 | Payer: MEDICARE | Primary: Adult Health

## 2024-07-10 DIAGNOSIS — R350 Frequency of micturition: Principal | ICD-10-CM

## 2024-07-10 DIAGNOSIS — R300 Dysuria: Secondary | ICD-10-CM

## 2024-07-17 ENCOUNTER — Encounter: Admit: 2024-07-17 | Discharge: 2024-07-17 | Payer: MEDICARE | Primary: Adult Health

## 2024-07-17 NOTE — Telephone Encounter
 Received a Teams message from one of the scheduling asking which triage RN covers Lehr's former pt and stating I have a lady whos BP 89/54.    Per chart review, pt is prescribed amlodipine  10 mg daily, Coreg  25 mg BID, and irbesartan  150 mg HS.    Called the pt.  She claims that she went to the ED yesterday and was filled up with pain meds for a uterine fibroid.  She also notes that she hasn't been eating much for the last few days because of pain and nausea.  She was given morphine and nausea medication in the ED and she was prescribed hydrocodone for home use.  The BP of 89/54 was taken this am at ~0700.  Her head was feeling funny, lightheaded, so she checked her pressure and has held her morning meds and hasn't taken any hydrocodone since last night.  She has not rechecked her BP, but has been drinking coffee with hopes that it will raise it.  Upon checking while on the phone with the pt, she obtained a BP of 154/74.    Informed the pt that she was likely dehydrated to begin with d/t lack of appetite for the past several days and that narcotics can lower BP.  Recommended that she limit caffeine intake and primarily drink water, at least twice as much plain water as coffee.  Also encouraged her to eat something and to continue to monitor her BP if symptomatic.  Recommended that she go ahead and take her BP meds, but that she use caution when taking the hydrocodone.  If symptoms persist, instructed her to call back.    Pt voices understanding.

## 2024-07-30 ENCOUNTER — Encounter: Admit: 2024-07-30 | Discharge: 2024-07-30 | Payer: MEDICARE | Primary: Adult Health

## 2024-07-30 NOTE — Telephone Encounter
 Called her back, last emesis 10:00am.  Blood sugar is 201 at time of call  We discussed gentle rehydration with gatorade, sprite zero, sugar free jello etc  BUT if unable to keep fluids down orally- she must go to ER  Voiced understanding

## 2024-07-30 NOTE — Telephone Encounter
 Waves of nausea and vomiting past 2 days- vomits whenever she tries to drink something  No fever, no abd pain just N/V up to 3-4 times daily, states still urinating BP 130/75 - last BM this am was hard  Has not checked blood sugar- instructed to do so now and will call her back  With her DM and CRI most likely will need ER eval for possible IV hydration (which she is not crazy about)
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
## 2024-08-07 ENCOUNTER — Encounter: Admit: 2024-08-07 | Discharge: 2024-08-07 | Payer: MEDICARE | Primary: Adult Health

## 2024-08-07 ENCOUNTER — Ambulatory Visit: Admit: 2024-08-07 | Discharge: 2024-08-08 | Payer: MEDICARE | Primary: Adult Health

## 2024-08-07 MED ORDER — PIOGLITAZONE 45 MG PO TAB
45 mg | ORAL_TABLET | Freq: Every day | ORAL | 3 refills | 90.00000 days | Status: AC
Start: 2024-08-07 — End: ?
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
## 2024-08-08 ENCOUNTER — Ambulatory Visit: Admit: 2024-08-08 | Discharge: 2024-08-08 | Payer: MEDICARE | Primary: Adult Health

## 2024-08-08 ENCOUNTER — Encounter: Admit: 2024-08-08 | Discharge: 2024-08-08 | Payer: MEDICARE | Primary: Adult Health

## 2024-08-08 ENCOUNTER — Ambulatory Visit: Admit: 2024-08-08 | Discharge: 2024-08-09 | Payer: MEDICARE | Primary: Adult Health

## 2024-08-08 VITALS — BP 109/67 | HR 67 | Temp 97.10000°F | Resp 16 | Ht 65.984 in | Wt 179.4 lb

## 2024-08-08 DIAGNOSIS — E538 Deficiency of other specified B group vitamins: Secondary | ICD-10-CM

## 2024-08-08 DIAGNOSIS — K580 Irritable bowel syndrome with diarrhea: Secondary | ICD-10-CM

## 2024-08-08 DIAGNOSIS — D509 Iron deficiency anemia, unspecified: Secondary | ICD-10-CM

## 2024-08-08 DIAGNOSIS — E559 Vitamin D deficiency, unspecified: Secondary | ICD-10-CM

## 2024-08-08 DIAGNOSIS — T466X5A Adverse effect of antihyperlipidemic and antiarteriosclerotic drugs, initial encounter: Secondary | ICD-10-CM

## 2024-08-08 DIAGNOSIS — R809 Proteinuria, unspecified: Secondary | ICD-10-CM

## 2024-08-08 DIAGNOSIS — K219 Gastro-esophageal reflux disease without esophagitis: Secondary | ICD-10-CM

## 2024-08-08 DIAGNOSIS — F22 Delusional disorders: Secondary | ICD-10-CM

## 2024-08-08 DIAGNOSIS — E1129 Type 2 diabetes mellitus with other diabetic kidney complication: Principal | ICD-10-CM

## 2024-08-08 DIAGNOSIS — F5104 Psychophysiologic insomnia: Secondary | ICD-10-CM

## 2024-08-08 DIAGNOSIS — Z1231 Encounter for screening mammogram for malignant neoplasm of breast: Secondary | ICD-10-CM

## 2024-08-08 DIAGNOSIS — D125 Benign neoplasm of sigmoid colon: Secondary | ICD-10-CM

## 2024-08-08 DIAGNOSIS — I10 Essential (primary) hypertension: Secondary | ICD-10-CM

## 2024-08-08 DIAGNOSIS — J4520 Mild intermittent asthma, uncomplicated: Secondary | ICD-10-CM

## 2024-08-08 DIAGNOSIS — N184 Chronic kidney disease, stage 4 (severe): Secondary | ICD-10-CM

## 2024-08-08 DIAGNOSIS — D259 Leiomyoma of uterus, unspecified: Secondary | ICD-10-CM

## 2024-08-08 DIAGNOSIS — Z1211 Encounter for screening for malignant neoplasm of colon: Secondary | ICD-10-CM

## 2024-08-08 DIAGNOSIS — Z0000 Encounter for general adult medical examination without abnormal findings: Principal | ICD-10-CM

## 2024-08-08 MED ORDER — ALBUTEROL SULFATE 90 MCG/ACTUATION IN HFAA
2 | RESPIRATORY_TRACT | 3 refills | 25.00000 days | Status: AC | PRN
Start: 2024-08-08 — End: ?

## 2024-08-09 DIAGNOSIS — E785 Hyperlipidemia, unspecified: Secondary | ICD-10-CM

## 2024-08-09 DIAGNOSIS — E1129 Type 2 diabetes mellitus with other diabetic kidney complication: Secondary | ICD-10-CM

## 2024-08-11 ENCOUNTER — Encounter: Admit: 2024-08-11 | Discharge: 2024-08-11 | Payer: MEDICARE | Primary: Adult Health

## 2024-08-12 ENCOUNTER — Other Ambulatory Visit (HOSPITAL_COMMUNITY): Payer: Self-pay

## 2024-08-12 ENCOUNTER — Encounter: Admit: 2024-08-12 | Discharge: 2024-08-12 | Payer: MEDICARE | Primary: Adult Health

## 2024-08-18 ENCOUNTER — Encounter: Admit: 2024-08-18 | Discharge: 2024-08-18 | Payer: MEDICARE | Primary: Adult Health

## 2024-08-18 NOTE — Telephone Encounter
 Reason for appointment Uterine leiomyoma    Referral Info: Internal/Routine-Pfeiffer, Monica D, APRN-NP/IM CL  Last pap smear: Hysterectomy  Important Considerations:   CKD, Stage 4 with history of microalbuminuria- followed by Nephrology.  04/2021: Creat higher - referred to Nephrology.  05/2021: Renal US  normal.  Microalbuminuria in past.  Available Records:   Physical-08/08/2024  Labs  CT -07/16/2024  There is a calcified uterine fibroid. Adnexa are unremarkable. No   pelvic lymphadenopathy or free fluid.   Records can be found in: [facility] Everywhere  Are additional records needed?  No  Care Coordinator plan:   Call patient  Spoke with patient to better understand the type of hysterectomy she had and patient stated it was a partial hysterectomy, but also was not quite sure and she does not remember who it was done by since it was so long ago. This RN will reach out to provider to verify if it is okay to order and ultrasound to visualize the possible uterine fibroid and clarify patients partial hysterectomy. RN informed patient that she will call the patient back after speaking with the provider. Patient stated she would be waiting. Call ended at this time.   No change, care coordinator review complete    Future Appointments   Date Time Provider Department Center   09/18/2024 11:00 AM Koverman, Michelle, [facility]   11/06/2024  1:15 PM Ora Massie BIRCH, MD [facility]N OB/GYN   12/03/2024  9:00 AM Elby Durwood LABOR, MD MPNEPHRO IM   08/12/2025  1:00 PM May, Fonda HERO, DO [facility]MP[facility] Community

## 2024-08-21 ENCOUNTER — Encounter: Admit: 2024-08-21 | Discharge: 2024-08-21 | Payer: MEDICARE | Primary: Adult Health

## 2024-09-18 ENCOUNTER — Ambulatory Visit: Admit: 2024-09-18 | Discharge: 2024-09-19 | Payer: MEDICARE | Primary: Adult Health
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
## 2024-10-31 ENCOUNTER — Encounter: Admit: 2024-10-31 | Discharge: 2024-10-31 | Payer: MEDICARE

## 2024-11-02 ENCOUNTER — Encounter: Admit: 2024-11-02 | Discharge: 2024-11-02 | Payer: MEDICARE

## 2024-11-06 ENCOUNTER — Encounter: Admit: 2024-11-06 | Discharge: 2024-11-06 | Payer: MEDICARE

## 2024-11-06 ENCOUNTER — Ambulatory Visit: Admit: 2024-11-06 | Discharge: 2024-11-06 | Payer: MEDICARE

## 2024-11-06 VITALS — BP 116/72 | HR 88 | Temp 98.30000°F | Ht 65.98 in | Wt 167.2 lb

## 2024-11-06 DIAGNOSIS — D219 Benign neoplasm of connective and other soft tissue, unspecified: Secondary | ICD-10-CM

## 2024-11-06 DIAGNOSIS — R102 Chronic female pelvic pain: Secondary | ICD-10-CM

## 2024-11-06 DIAGNOSIS — R1084 Generalized abdominal pain: Principal | ICD-10-CM

## 2024-11-06 DIAGNOSIS — M7918 Myalgia, other site: Secondary | ICD-10-CM

## 2024-11-06 NOTE — Progress Notes [1]
 Date of Service: 11/06/2024    Subjective:             Rhonda Fletcher is a 69 y.o. female.    History of Present Illness     Rhonda Fletcher is a 69 year old female who presents with abdominal pain.    She experiences severe abdominal pain described as a 'knot' in her stomach, primarily on the right side. The pain is constant, exacerbated by certain foods, and has led to significant weight loss due to reduced food intake. The intensity of the pain often keeps her 'bent over'.    She recalls a previous emergency room visit where a fibroid was identified, although she has no known history of fibroids prior to this incident. She has not had a complete hysterectomy but mentions a partial gynecologic surgery many years ago, the details of which are unclear to her.    She reports no vaginal bleeding, but states that the 'knots' on her abdomen sometimes burst and bleed externally. The pain occasionally radiates to her back.    She resides in [place].              Past Medical History:    Allergic rhinitis, cause unspecified    Anemia, unspecified    Carpal tunnel syndrome    Hand pain, left    Helicobacter pylori gastritis    IBS (irritable bowel syndrome)    Other and unspecified hyperlipidemia    Reflex sympathetic dystrophy    Type II or unspecified type diabetes mellitus without mention of complication, not stated as uncontrolled    Unspecified asthma(493.90)    Unspecified constipation    Unspecified essential hypertension    Unspecified vitamin D deficiency     Surgical History:   Procedure Laterality Date    COLONOSCOPY  10/30/2006    HX CHOLECYSTECTOMY  2015    BREAST REDUCTION      bilateral    CARPAL TUNNEL RELEASE  left 07/12/2001, right08/09/2001    FOOT SURGERY      bilateral, Dr. Sherre    TUBAL LIGATION       Family History   Problem Relation Name Age of Onset    Other Mother          muscular dystrophy    Diabetes Father      Other Brother          liver disease    Diabetes Brother      Cancer Maternal Grandmother          colon     Social History[1]           Medical Marijuana    Product Type Edible     Route Inhaled     Current Frequency Multiple times daily     Indication Pain     Product Type Comment also cigarette     Max Frequency Multiple times daily     Indication Comments hand pain and to relax          Objective:         ACCU-CHEK GUIDE TEST STRIPS test strip TEST BLOOD SUGAR AS DIRECTED DAILY BEFORE BREAKFAST    ACCU-CHEK SOFTCLIX LANCETS MISC TEST BLOOD SUGAR DAILY BEFORE BREAKFAST AS DIRECTED    albuterol  sulfate (PROAIR  HFA) 90 mcg/actuation HFA aerosol inhaler Inhale two puffs by mouth into the lungs every 6 hours as needed.    alcohol  swabs  (DROPSAFE ALCOHOL  PREP PADS) USE TO CLEAN SKIN BEFORE FINGERSTICK  amLODIPine  (NORVASC ) 10 mg tablet TAKE 1 TABLET EVERY DAY    azelastine  (ASTELIN ) 137 mcg (0.1 %) nasal spray Apply two sprays to each nostril as directed twice daily.    betamethasone  valerate (VALISONE ) 0.1 % topical cream APPLY TOPICALLY TO AFFECTED AREA DAILY    Blood Glucose Control, Normal soln Use in glucose meter, Accu-check Aviva    blood-glucose meter (ACCU-CHEK GUIDE GLUCOSE METER) kit Use one strip as directed before meals and at bedtime. Diagnosis Code: 11.29  Indications: type 2 diabetes mellitus    carvediloL  (COREG ) 25 mg tablet TAKE 1 TABLET TWICE DAILY WITH MEALS    cetirizine (ZYRTEC) 10 mg tablet Take one tablet by mouth daily.      cholecalciferol(+) (VITAMIN D-3) 2,000 unit tablet Take 1 Tab by mouth daily. (Patient taking differently: Take one tablet by mouth daily. 5,000 units daily)    cyanocobalamin (vitamin B-12) 5,000 mcg cap Take one capsule by mouth daily. (Patient taking differently: Take one capsule by mouth every 48 hours.)    cyclobenzaprine  (FLEXERIL ) 5 mg tablet TAKE 1 TABLET AT BEDTIME    famotidine  (PEPCID ) 40 mg tablet TAKE 1 TABLET TWICE DAILY    ferrous sulfate (FEOSOL) 325 mg (65 mg iron ) tablet Take one tablet by mouth every 48 hours. fluticasone  propionate (FLONASE ) 50 mcg/actuation nasal spray, suspension USE 2 SPRAYS IN EACH NOSTRIL DAILY AS DIRECTED. SHAKE BOTTLE GENTLY BEFORE USING    glimepiride  (AMARYL ) 4 mg tablet TAKE 1 TABLET EVERY MORNING    irbesartan  (AVAPRO ) 150 mg tablet Take one tablet by mouth at bedtime daily. Replaces irbesartan  300 mg nightly.    pioglitazone  (ACTOS ) 45 mg tablet Take one tablet by mouth daily.    sodium bicarbonate  650 mg tablet Take one tablet by mouth twice daily.     Vitals:    11/06/24 1250   BP: 116/72   Pulse: 88   Temp: 36.8 ?C (98.3 ?F)   TempSrc: Oral   PainSc: Six   Weight: 75.8 kg (167 lb 3.2 oz)   Height: 167.6 cm (5' 5.98)     Body mass index is 27 kg/m?Rhonda Fletcher     Physical Exam  Vital signs reviewed.  CONSTITUTIONAL: Healthy appearing female.  Alert and oriented x 3, and in no acute distress.  EYES: No scleral icterus, EOMI.  ENT:  Normal range of motion.    CV:  Regular rate.  RESP:  Normal effort, unlabored breathing.  ABDOMEN:  Soft and non-distended.  No mass, rebound, or guarding.  severe tenderness to palpation located in lower abdomen.  positive Carnett's sign.  PELVIC:  Normal appearing external female genitalia.  Atrophic vaginal epithelium without abnormal lesions or discharge. Normal appearing cervix. Uterus is small, mobile, and nontender. No palpable adnexal masses.  No anoperineal lesions.  severe tenderness with palpation of levator ani muscles.  severe tenderness with palpation of obturator internus muscles.  This does duplicate the bulk of pain symptoms.  SKIN:  Warm, dry.  No concerning lesions.  EXTREMITIES: No edema.  PSYCH: Appropriate mood and affect.       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 Significant tenderness in the abdominal and pelvic muscles suggests myofascial pain. Differential diagnosis includes musculoskeletal pain versus fibroid-related pain.  - Referred to physical therapy for management of myofascial pain.  - Provided contact information for scheduling physical therapy sessions.    Uterine fibroid (under evaluation)  Uterine fibroid identified on previous imaging, currently under evaluation for contribution to pain. No history of fibroids or hysterectomy. Ultrasound scheduled to assess fibroid size and characteristics.  - Await results of scheduled ultrasound to evaluate fibroid size and characteristics.  - Will communicate ultrasound findings to determine if fibroid is contributing to pain.           Rhonda JONETTA Plater, MD                  [1]   Social History  Socioeconomic History    Marital status: Single   Tobacco Use    Smoking status: Never    Smokeless tobacco: Never   Substance and Sexual Activity    Alcohol  use: Yes     Alcohol /week: 2.0 - 6.0 standard drinks of alcohol      Types: 2 - 6 Standard drinks or equivalent per week     Comment: occasionaly    Drug use: Yes     Types: Marijuana     Comment: smoke marijuana every day    Sexual activity: Never

## 2024-11-06 NOTE — Patient Instructions [37]
Call [phone] to schedule Pelvic Physical Therapy.

## 2024-11-07 ENCOUNTER — Encounter: Admit: 2024-11-07 | Discharge: 2024-11-07 | Payer: MEDICARE

## 2024-11-11 ENCOUNTER — Encounter: Admit: 2024-11-11 | Discharge: 2024-11-11 | Payer: MEDICARE

## 2024-11-12 ENCOUNTER — Encounter: Admit: 2024-11-12 | Discharge: 2024-11-12 | Payer: MEDICARE

## 2024-11-21 ENCOUNTER — Encounter: Admit: 2024-11-21 | Discharge: 2024-11-21 | Payer: MEDICARE

## 2024-11-21 MED ORDER — BLOOD-GLUCOSE METER MISC MISC
1 | Freq: Before meals | 0 refills | Status: AC
Start: 2024-11-21 — End: ?
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
## 2024-12-03 ENCOUNTER — Ambulatory Visit: Admit: 2024-12-03 | Discharge: 2024-12-03 | Payer: MEDICARE

## 2024-12-03 ENCOUNTER — Encounter: Admit: 2024-12-03 | Discharge: 2024-12-03 | Payer: MEDICARE

## 2024-12-03 DIAGNOSIS — I10 Essential (primary) hypertension: Secondary | ICD-10-CM

## 2024-12-03 DIAGNOSIS — E875 Hyperkalemia: Secondary | ICD-10-CM

## 2024-12-03 DIAGNOSIS — N184 Chronic kidney disease, stage 4 (severe): Secondary | ICD-10-CM

## 2024-12-03 NOTE — Patient Instructions [37]
 The following are some measures which may help to slow down the progression of kidney disease:      Maintain adequate fluid intake.    Acute illnesses like the flu and COVID which may end up in hospitalization may impact on  kidney function and could cause the kidneys to fail.    It is important to keep up with  regular vaccinations for these diseases including the pneumonia vaccine as well.    If you develop diarrhea or vomiting and  are unable to keep up with your fluid intake, please report to the nearest ED since it may affect blood pressure and affect the blood flow to the kidney.    Avoid medications known as  NSAIDs which  include Advil, Motrin,Aleve, ibuprofen, naproxen.    Some medications used in patients with kidney disease including diuretics [water pill], some blood pressure medications [ACE inhibitors and ARB] like lisinopril, losartan may need to be held if you develop severe diarrhea or vomiting.  Under these conditions these medications may worsen kidney function and therefore need to be held until diarhea/vomiting resolves. If you have to hold your blood pressure or water pill medication for any of these reasons, please inform the provider who prescribed the medication.    Some medications used for stomach acid suppression  known as Proton Pump Inhibitors (which include pantoprazole, omeprazole, lansoprazole, nexium, protonix, prilosec) have been associated with kidney disease through unknown mechanisms. Unless you have a compelling indication for any of these medications , consider switching to a different class of medications known as histamine-2 receptor blockers eg. famotidine,  etc (your pharmacist or PCP can recommend a brand).    Nutrition management is a critical component in slowing kidney disease progression.  Visit the following web site for information on diet in patients with kidney disease:  https://www.kidney.org/nutrition    Visit the following website for health education videos on kidney disease:  [email or web address].kidney.[email or web address]
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
## 2024-12-04 ENCOUNTER — Encounter: Admit: 2024-12-04 | Discharge: 2024-12-04 | Payer: MEDICARE

## 2024-12-04 DIAGNOSIS — N1832 Chronic kidney disease, stage 3b (CMS-HCC): Principal | ICD-10-CM
